# Patient Record
Sex: Female | Born: 1975
Health system: Southern US, Community
[De-identification: ages and names within clinical notes are randomized; demographics above are authoritative.]

## PROBLEM LIST (undated history)

## (undated) DIAGNOSIS — E7800 Pure hypercholesterolemia, unspecified: Secondary | ICD-10-CM

## (undated) DIAGNOSIS — I499 Cardiac arrhythmia, unspecified: Secondary | ICD-10-CM

## (undated) DIAGNOSIS — K922 Gastrointestinal hemorrhage, unspecified: Secondary | ICD-10-CM

## (undated) DIAGNOSIS — M109 Gout, unspecified: Secondary | ICD-10-CM

## (undated) DIAGNOSIS — I639 Cerebral infarction, unspecified: Secondary | ICD-10-CM

## (undated) DIAGNOSIS — K219 Gastro-esophageal reflux disease without esophagitis: Secondary | ICD-10-CM

## (undated) DIAGNOSIS — G629 Polyneuropathy, unspecified: Secondary | ICD-10-CM

## (undated) DIAGNOSIS — N186 End stage renal disease: Secondary | ICD-10-CM

## (undated) DIAGNOSIS — Z9289 Personal history of other medical treatment: Secondary | ICD-10-CM

## (undated) DIAGNOSIS — I10 Essential (primary) hypertension: Secondary | ICD-10-CM

## (undated) DIAGNOSIS — I509 Heart failure, unspecified: Secondary | ICD-10-CM

## (undated) DIAGNOSIS — F419 Anxiety disorder, unspecified: Secondary | ICD-10-CM

## (undated) DIAGNOSIS — R51 Headache: Secondary | ICD-10-CM

## (undated) DIAGNOSIS — R011 Cardiac murmur, unspecified: Secondary | ICD-10-CM

## (undated) DIAGNOSIS — R0600 Dyspnea, unspecified: Secondary | ICD-10-CM

## (undated) DIAGNOSIS — N050 Unspecified nephritic syndrome with minor glomerular abnormality: Secondary | ICD-10-CM

## (undated) DIAGNOSIS — M1990 Unspecified osteoarthritis, unspecified site: Secondary | ICD-10-CM

## (undated) DIAGNOSIS — D509 Iron deficiency anemia, unspecified: Secondary | ICD-10-CM

## (undated) DIAGNOSIS — Z992 Dependence on renal dialysis: Secondary | ICD-10-CM

## (undated) HISTORY — PX: ESOPHAGOGASTRODUODENOSCOPY: SHX1529

## (undated) HISTORY — PX: CARDIAC CATHETERIZATION: SHX172

## (undated) HISTORY — PX: FRACTURE SURGERY: SHX138

---
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
## 2008-01-06 HISTORY — PX: ABDOMINAL HYSTERECTOMY: SHX81

## 2009-01-24 ENCOUNTER — Emergency Department (HOSPITAL_COMMUNITY): Admission: EM | Admit: 2009-01-24 | Discharge: 2009-01-24 | Payer: Self-pay | Admitting: Emergency Medicine

## 2009-04-02 ENCOUNTER — Other Ambulatory Visit: Admission: RE | Admit: 2009-04-02 | Discharge: 2009-04-02 | Payer: Self-pay | Admitting: Family Medicine

## 2009-04-17 ENCOUNTER — Emergency Department (HOSPITAL_COMMUNITY): Admission: EM | Admit: 2009-04-17 | Discharge: 2009-04-18 | Payer: Self-pay | Admitting: Emergency Medicine

## 2009-04-18 ENCOUNTER — Ambulatory Visit: Payer: Self-pay | Admitting: Surgery

## 2009-04-18 ENCOUNTER — Encounter (INDEPENDENT_AMBULATORY_CARE_PROVIDER_SITE_OTHER): Payer: Self-pay | Admitting: Emergency Medicine

## 2009-04-18 ENCOUNTER — Ambulatory Visit (HOSPITAL_COMMUNITY): Admission: RE | Admit: 2009-04-18 | Discharge: 2009-04-18 | Payer: Self-pay | Admitting: Emergency Medicine

## 2009-06-10 ENCOUNTER — Encounter: Admission: RE | Admit: 2009-06-10 | Discharge: 2009-06-10 | Payer: Self-pay | Admitting: Family Medicine

## 2009-09-03 ENCOUNTER — Emergency Department (HOSPITAL_COMMUNITY): Admission: EM | Admit: 2009-09-03 | Discharge: 2009-09-04 | Payer: Self-pay | Admitting: Emergency Medicine

## 2009-09-12 ENCOUNTER — Encounter: Admission: RE | Admit: 2009-09-12 | Discharge: 2009-09-12 | Payer: Self-pay | Admitting: Family Medicine

## 2010-01-26 ENCOUNTER — Encounter: Payer: Self-pay | Admitting: Family Medicine

## 2010-02-25 ENCOUNTER — Observation Stay (HOSPITAL_COMMUNITY)
Admission: EM | Admit: 2010-02-25 | Discharge: 2010-02-28 | DRG: 143 | Disposition: A | Discharge status: Home / Self Care | Payer: BC Managed Care – PPO | Attending: Family Medicine | Admitting: Family Medicine

## 2010-02-25 ENCOUNTER — Emergency Department (HOSPITAL_COMMUNITY): Payer: BC Managed Care – PPO

## 2010-02-25 DIAGNOSIS — R079 Chest pain, unspecified: Principal | ICD-10-CM | POA: Insufficient documentation

## 2010-02-25 DIAGNOSIS — E119 Type 2 diabetes mellitus without complications: Secondary | ICD-10-CM | POA: Insufficient documentation

## 2010-02-25 DIAGNOSIS — I10 Essential (primary) hypertension: Secondary | ICD-10-CM | POA: Insufficient documentation

## 2010-02-25 DIAGNOSIS — R42 Dizziness and giddiness: Secondary | ICD-10-CM | POA: Insufficient documentation

## 2010-02-25 DIAGNOSIS — E785 Hyperlipidemia, unspecified: Secondary | ICD-10-CM | POA: Insufficient documentation

## 2010-02-25 DIAGNOSIS — K219 Gastro-esophageal reflux disease without esophagitis: Secondary | ICD-10-CM | POA: Insufficient documentation

## 2010-02-25 LAB — CBC
MCH: 25.2 pg — ABNORMAL LOW (ref 26.0–34.0)
RDW: 13.9 % (ref 11.5–15.5)
WBC: 5.9 10*3/uL (ref 4.0–10.5)

## 2010-02-25 LAB — URINALYSIS, ROUTINE W REFLEX MICROSCOPIC
Bilirubin Urine: NEGATIVE
Ketones, ur: NEGATIVE mg/dL
Specific Gravity, Urine: 1.02 (ref 1.005–1.030)

## 2010-02-25 LAB — POCT CARDIAC MARKERS
CKMB, poc: <1 ng/mL — ABNORMAL LOW (ref 1.0–8.0)
Troponin i, poc: 0.06 ng/mL (ref 0.00–0.09)
Troponin i, poc: <0.05 ng/mL (ref 0.00–0.09)

## 2010-02-25 LAB — POCT I-STAT, CHEM 8
Calcium, Ion: 1.23 mmol/L (ref 1.12–1.32)
Glucose, Bld: 112 mg/dL — ABNORMAL HIGH (ref 70–99)
HCT: 39 % (ref 36.0–46.0)
Hemoglobin: 13.3 g/dL (ref 12.0–15.0)
TCO2: 27 mmol/L (ref 0–100)

## 2010-02-25 LAB — DIFFERENTIAL
Basophils Relative: 1 % (ref 0–1)
Lymphocytes Relative: 46 % (ref 12–46)
Lymphs Abs: 2.7 10*3/uL (ref 0.7–4.0)

## 2010-02-25 LAB — D-DIMER, QUANTITATIVE: D-Dimer, Quant: <0.22 ug/mL-FEU (ref 0.00–0.48)

## 2010-02-25 LAB — CK TOTAL AND CKMB (NOT AT ARMC): Total CK: 103 U/L (ref 7–177)

## 2010-02-26 DIAGNOSIS — R079 Chest pain, unspecified: Secondary | ICD-10-CM

## 2010-02-26 LAB — CARDIAC PANEL(CRET KIN+CKTOT+MB+TROPI)
CK, MB: 1 ng/mL (ref 0.3–4.0)
Relative Index: INVALID (ref 0.0–2.5)
Total CK: 105 U/L (ref 7–177)

## 2010-02-26 LAB — URINE CULTURE: Colony Count: 8000

## 2010-02-26 LAB — COMPREHENSIVE METABOLIC PANEL
ALT: 21 U/L (ref 0–35)
AST: 22 U/L (ref 0–37)
Alkaline Phosphatase: 36 U/L — ABNORMAL LOW (ref 39–117)
Chloride: 102 mEq/L (ref 96–112)
Glucose, Bld: 101 mg/dL — ABNORMAL HIGH (ref 70–99)
Potassium: 3.7 mEq/L (ref 3.5–5.1)

## 2010-02-26 LAB — HEMOGLOBIN A1C
Hgb A1c MFr Bld: 6.2 % — ABNORMAL HIGH (ref <5.7)
Mean Plasma Glucose: 131 mg/dL — ABNORMAL HIGH (ref <117)

## 2010-02-26 LAB — CBC
HCT: 32.9 % — ABNORMAL LOW (ref 36.0–46.0)
RDW: 14.2 % (ref 11.5–15.5)
WBC: 6.1 10*3/uL (ref 4.0–10.5)

## 2010-02-26 LAB — LIPID PANEL: HDL: 33 mg/dL — ABNORMAL LOW (ref >39)

## 2010-02-26 LAB — GLUCOSE, CAPILLARY

## 2010-02-26 LAB — RAPID URINE DRUG SCREEN, HOSP PERFORMED
Amphetamines: NOT DETECTED
Barbiturates: NOT DETECTED
Benzodiazepines: NOT DETECTED
Tetrahydrocannabinol: NOT DETECTED

## 2010-02-26 NOTE — H&P (Signed)
NAMEJOSEPHINA, Patricia [name]             ACCOUNT NO.:  [id number]  MEDICAL RECORD NO.:  [id number]           PATIENT TYPE:  E  LOCATION:  [facility]                         FACILITY:  [facility]  PHYSICIAN:  Karlyn Agee, M.D. DATE OF BIRTH:  1975-10-21  DATE OF ADMISSION:  02/25/2010 DATE OF DISCHARGE:                             HISTORY & PHYSICAL   PRIMARY CARE PHYSICIAN:  [facility], she cannot remember the doctor's name.  CHIEF COMPLAINT:  Persistent chest pain since 5 a.m. this morning.  HISTORY OF PRESENT ILLNESS:  This is a morbidly obese 35 year old African American lady with history of diabetes, hypertension, hyperlipidemia, and GERD who was awakened from sleep at about 5 a.m. this morning with 10/10 central, pressing chest pain which she initially felt was due to gas and she has tried omeprazole, various antacids without relief.  She says the pain is very difficult to describe, it is sharp, it is shooting, it is pressing.  She eventually decided to go to work, but had difficulty working due to the persistent pain and eventually decided to come to the emergency room.  She denies any associated diaphoresis or shortness of breath.  She did have some dizziness.  She did have some nausea and no vomiting.  She has had no lower extremity edema, no paroxysmal nocturnal dyspnea.  PAST MEDICAL HISTORY:  Diabetes, hypertension, hyperlipidemia, and GERD.  MEDICATIONS: 1. HCTZ 25 mg each evening. 2. Lisinopril 40 mg each evening. 3. Metformin 500 mg each evening. 4. Omeprazole 40 mg each evening. 5. Simvastatin 20 mg each evening.  ALLERGIES:  No known drug allergies.  SOCIAL HISTORY:  She denies tobacco, alcohol, or illicit drug use.  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  FAMILY HISTORY: Father had heart attack at age 109.  PHYSICAL EXAMINATION:  GENERAL:  An obese young African American  lady lying in the stretcher, looking uncomfortable. VITAL SIGNS:  She gives her height is 5 feet 8 inches, her weight is 260 pounds, her temperature is 98.6, pulse 88, respirations 16, blood pressure 98/45, she is saturating 100% on room air. HEENT:  Pupils are round, equal, and reactive.  Mucous membranes pink. Anicteric.  No cervical lymphadenopathy or thyromegaly.  No carotid bruit, although she has very thick neck. CHEST:  Clear to auscultation bilaterally.  She has no reproducible chest wall pain. ABDOMEN:  Obese, soft, and nontender.  She has no epigastric tenderness. EXTREMITIES:  Without edema.  Dorsalis pedis pulses 2+ bilaterally. CENTRAL NERVOUS SYSTEM:  Cranial nerves II-XII are grossly intact.  She has no focal lateralizing signs.  Her labs, her white count 5.8, hemoglobin 11.7, platelets 240, normal differential.  Her sodium is 138, potassium 3.5, chloride 100, glucose 112, BUN 10, creatinine 0.7.  Her D-dimer is undetectable.  Cardiac markers initially showed a troponin of 0.06, myoglobin of 54, undetectable CK-MB, repeat shows undetectable troponin and CK-MB, myoglobin of 34.  Urinalysis is completely unremarkable.  Her EKG shows normal sinus rhythm with no ST-segment changes.  Chest x-ray shows low lung volumes with clear lungs.  ASSESSMENT:  Persistent chest pain, atypical in nature but lady does have multiple cardiac risk factors including hypertension, diabetes, and obesity, to say nothing on family history.  We will therefore bring in on observation for cardiac workup.  We will consult Cardiology for assistance with management.  We will go ahead and give nitro paste, cycle cardiac enzymes, a low-dose beta blocker as tolerated and manage her on step-down.  We will continue morphine for the pain.  We will give her intravenous proton pump inhibitors.  Otherwise other plans as per orders.     Karlyn Agee, M.D.     LC/MEDQ  D:  02/25/2010  T:  02/25/2010   Job:  [id number]  cc:   [facility]  Electronically Signed by Karlyn Agee M.D. on 02/26/2010 03:01:18 AM

## 2010-02-27 ENCOUNTER — Inpatient Hospital Stay (HOSPITAL_COMMUNITY): Payer: BC Managed Care – PPO

## 2010-02-27 LAB — GLUCOSE, CAPILLARY
Glucose-Capillary: 145 mg/dL — ABNORMAL HIGH (ref 70–99)
Glucose-Capillary: 95 mg/dL (ref 70–99)

## 2010-02-28 ENCOUNTER — Inpatient Hospital Stay (HOSPITAL_COMMUNITY): Payer: BC Managed Care – PPO

## 2010-02-28 DIAGNOSIS — R072 Precordial pain: Secondary | ICD-10-CM

## 2010-02-28 LAB — GLUCOSE, CAPILLARY

## 2010-02-28 MED ORDER — TECHNETIUM TC 99M TETROFOSMIN IV KIT
30.0000 | PACK | Freq: Once | INTRAVENOUS | Status: AC | PRN
  Administered 2010-02-27: 30 via INTRAVENOUS

## 2010-02-28 MED ORDER — TECHNETIUM TC 99M TETROFOSMIN IV KIT
30.0000 | PACK | Freq: Once | INTRAVENOUS | Status: AC | PRN
  Administered 2010-02-28: 30 via INTRAVENOUS

## 2010-03-13 NOTE — Op Note (Signed)
Patricia [name], Patricia [name]             ACCOUNT NO.:  [id number]  MEDICAL RECORD NO.:  [id number]           PATIENT TYPE:  I  LOCATION:  2021                         FACILITY:  [facility]  PHYSICIAN:  Murray Hodgkins, MD    DATE OF BIRTH:  [birth year]  DATE OF PROCEDURE: DATE OF DISCHARGE:  02/28/2010                              DISCHARGE SUMMARY   PRIMARY CARE PHYSICIAN:  Kathyrn Lass, MD with [facility].  DISCHARGE DIAGNOSES: 1. Atypical chest pain with negative cardiac workup this admission. 2. Type 2 diabetes. 3. Dyslipidemia. 4. Hypertension. 5. Obesity.  PROCEDURES DURING HOSPITALIZATION: 1. Chest x-ray performed on February 25, 2010, with no acute findings. 2. Myoview performed on February 28, 2010, with no findings of     infarction or ischemia showing ejection fraction of 68%. 3. A 2-D echocardiogram performed on February 28, 2010, with results     pending at time of this dictation.  CONSULTATIONS DURING HOSPITALIZATION:  [facility], Shaune Pascal. Bensimhon, MD.  HISTORY OF PRESENT ILLNESS:  Patricia [name] is a 35 year old African- American female with history of type 2 diabetes, hypertension, hyperlipidemia, and obesity who presented to [facility] Emergency Department on date of admission with complaints of chest pain.  The patient stated chest pain awoke her from sleep on morning of admission. She described pain as pressing in nature and unrelieved with omeprazole and antacids.  Given the patient's multiple risk factors for coronary artery disease including history listed above and family history, the patient was admitted for further evaluation.  COURSE OF HOSPITALIZATION: 1. Atypical chest pain.  As mentioned above, the patient was admitted     due to multiple comorbidities and risk factors for coronary artery     disease, [facility] was asked to see the patient in     consultation.  At this time, the patient has undergone negative     cardiac  workup including negative cardiac enzymes, unremarkable     EKG, and negative Myoview study performed over two days.  The patient     is felt medically stable for disposition to home.  Her 2-D     echocardiogram will be followed up by [facility].  The     patient's pain could be musculoskeletal in nature as the patient     does do a significant amount of heavy lifting as she works as a     Quarry manager. 2. Type 2 diabetes.  Hemoglobin A1c during this admission 6.2%.  The     patient will be discharged home on metformin as prior to admission. 3. Hyperlipidemia.  The patient will continue stat medication at time     of discharge with slightly abnormal LDL and HDL.  The patient has     been counseled extensively on lifestyle changes including diet and     exercise.  The patient has verified understanding.  Would recommend     PCP followup, lipids in 3-6 months to determine need for further     counseling and/or titration of medication. 4. History of hypertension.  We will continue the patient on ACE     inhibitor and diuretic  at discharge.  LABORATORY DATA:  Pertinent labs at the time of discharge; hemoglobin A1c 6.2, total cholesterol 175, triglycerides 122, HDL 33, LDL 118.  MEDICATIONS:  At time of discharge, 1. Hydrochlorothiazide 25 mg p.o. daily. 2. Lisinopril 40 mg p.o. daily. 3. Metformin 500 mg p.o. daily. 4. Omeprazole 40 mg p.o. daily. 5. Simvastatin 20 mg p.o. daily.  PHYSICAL EXAM:  VITAL SIGNS:  At the time of discharge, blood pressure 103/70, heart rate 67, respirations 19, temperature 98.1, O2 sat is 99% on room air. GENERAL:  This is an obese African-American female sitting upright in bed, in no acute distress. HEENT:  Head is normocephalic, atraumatic.  Eyes, extraocular motions intact without scleral icterus or injection.  Ears, nose, and throat, mucous membranes are moist with no oropharyngeal lesions. NECK:  Supple with no thyromegaly or lymphadenopathy.  No JVD  or carotid bruits. CHEST:  Symmetrical movement, nontender to palpation. CARDIOVASCULAR:  S1 and S2, regular rate and rhythm.  No murmur, rub, or gallop.  No lower extremity edema. RESPIRATORY:  Lung sounds are clear to auscultation bilaterally.  No wheezes, rales, or crackles.  No increased work of breathing. GI:  Abdomen is obese, soft, nontender, nondistended with positive bowel sounds.  No appreciated masses or hepatosplenomegaly. NEUROLOGIC:  The patient is able to move all extremities x4 without motor or sensory deficit on exam. SKIN:  Without suspicious rashes or lesions. PSYCHOLOGIC:  The patient is alert and oriented x4 with very pleasant mood and affect.  DISPOSITION:  The patient is felt medically stable for discharge home at this time.  Again Cardiology will follow results of the patient's 2-D echocardiogram.  The patient has been asked to follow up with her primary care physician in 1-2 weeks post discharge.  Greater than 30 minutes spent on discharge planning.     Patricia Ranks, NP   ______________________________ Murray Hodgkins, MD    LE/MEDQ  D:  02/28/2010  T:  02/28/2010  Job:  [id number]  cc:   Kathyrn Lass, M.D.  Electronically Signed by Patricia Ranks NP on 03/03/2010 09:52:02 AM Electronically Signed by Murray Hodgkins  on 03/12/2010 09:03:22 PM

## 2010-03-14 NOTE — Consult Note (Signed)
Patricia [name], DWIGGINS             ACCOUNT NO.:  [id number]  MEDICAL RECORD NO.:  [id number]           PATIENT TYPE:  E  LOCATION:  [facility]                         FACILITY:  [facility]  PHYSICIAN:  Johnney Ou, MD DATE OF BIRTH:  Feb 22, 1975  DATE OF CONSULTATION:  02/25/2010 DATE OF DISCHARGE:                                CONSULTATION   PRIMARY CARDIOLOGIST:  None.  PRIMARY DOCTOR:  [facility].  REASON FOR CONSULTATION:  Chest pain.  CHIEF COMPLAINT:  Chest pain.  HISTORY OF PRESENT ILLNESS:  This is a 35 year old obese African American female with a history of hypertension and diabetes who presents here with chest pain starting today.  Her symptoms are described as sharp, shooting pains radiating to her left shoulder.  These symptoms began this morning and worsened throughout the day.  She also reports her symptoms worsening sometimes associated with breathing.  She otherwise denies any previous exertional angina, dyspnea on exertion, or increased lower extremity edema.  She also reports having a negative stress test a few years ago for similar symptoms.  PAST MEDICAL HISTORY: 1. Diabetes. 2. Hypertension. 3. Obesity. 4. GERD.  ALLERGIES:  No known drug allergies.  MEDICATIONS ON ADMISSION: 1. Hydrochlorothiazide 25 mg daily. 2. Lisinopril 40 mg daily. 3. Metformin 500 mg daily. 4. Omeprazole.  SOCIAL HISTORY:  She lives in [place].  She works at [employer].  She does not smoke.  FAMILY HISTORY:  She describes her father as having heart disease, diagnosed in his 83s.  REVIEW OF SYSTEMS:  All 14 systems were reviewed and were negative except as mentioned in detail in HPI.  PHYSICAL EXAMINATION:  VITAL SIGNS:  Blood pressure is 110/53, respiratory rate is 20, pulses 78 and regular, and saturating 100% on room air. GENERAL:  She is a 35 year old obese African American female appearing stated age in no  acute distress. HEENT:  Moist mucous membranes.  Pupils equal, round, and reactive to light and accommodation.  Anicteric sclerae. NECK:  No jugular venous distention.  No thyromegaly. CARDIOVASCULAR:  Regular rate and rhythm.  No murmurs, rubs, or gallops. LUNGS:  Clear to auscultation bilaterally. ABDOMEN:  Nontender and nondistended.  Positive bowel sounds.  No masses. EXTREMITIES:  No clubbing, cyanosis, or edema. NEUROLOGIC:  Alert and oriented x3.  Cranial nerves II through XII grossly intact.  No focal neurologic deficits. SKIN:  Warm, dry, and intact.  No rashes. PSYCHIATRIC:  Mood and affect are appropriate.  RADIOLOGY:  Chest x-ray showed no acute cardiopulmonary process.  EKG showed normal sinus rhythm with a rate of 82 beats per minute with no S3 or T-wave abnormalities, normal EKG.  LABORATORY REVIEW:  White blood cell count is 5.9 and hematocrit 35.4. Potassium 3.5, creatinine is 0.7.  D-dimer is less than 0.22.  Troponin is less than 0.05.  ASSESSMENT AND PLAN:  This is a 35 year old [place] American female with a history of hypertension and diabetes here with atypical chest pain. 1. Chest pain.  There is currently no objective evidence of ischemia.     Continue to cycle serial cardiac enzymes to rule out  myocardial     infarction.  This chest pain is atypical and is less likely     musculoskeletal in etiology though she may benefit from noninvasive     risk stratification in the morning.  Additionally, lifestyle     modification should be recommended and was discussed during this     encounter. 2. Hypertension.  Continue her current medical regimen.  Her blood     pressure is currently well controlled. 3. Diabetes.  Check A1c and fasting lipid profile.     Johnney Ou, MD     BHH/MEDQ  D:  02/25/2010  T:  02/25/2010  Job:  [id number]  Electronically Signed by Matthew Saras MD on 03/14/2010 12:53:24 PM

## 2010-03-21 LAB — DIFFERENTIAL
Basophils Absolute: 0 10*3/uL (ref 0.0–0.1)
Lymphocytes Relative: 41 % (ref 12–46)
Neutro Abs: 3.9 10*3/uL (ref 1.7–7.7)

## 2010-03-21 LAB — D-DIMER, QUANTITATIVE: D-Dimer, Quant: <0.22 ug/mL-FEU (ref 0.00–0.48)

## 2010-03-21 LAB — HEPATIC FUNCTION PANEL
AST: 29 U/L (ref 0–37)
Albumin: 3.6 g/dL (ref 3.5–5.2)
Total Protein: 6.7 g/dL (ref 6.0–8.3)

## 2010-03-21 LAB — CBC
Platelets: 314 10*3/uL (ref 150–400)
RBC: 4.69 MIL/uL (ref 3.87–5.11)
RDW: 13.6 % (ref 11.5–15.5)
WBC: 7.8 10*3/uL (ref 4.0–10.5)

## 2010-03-21 LAB — BASIC METABOLIC PANEL
BUN: 10 mg/dL (ref 6–23)
Creatinine, Ser: 0.69 mg/dL (ref 0.4–1.2)
GFR calc Af Amer: >60 mL/min (ref >60)
GFR calc non Af Amer: >60 mL/min (ref >60)
Potassium: 3.9 mEq/L (ref 3.5–5.1)

## 2010-03-24 LAB — COMPREHENSIVE METABOLIC PANEL
AST: 19 U/L (ref 0–37)
CO2: 26 mEq/L (ref 19–32)
Chloride: 104 mEq/L (ref 96–112)
Creatinine, Ser: 0.51 mg/dL (ref 0.4–1.2)
GFR calc Af Amer: >60 mL/min (ref >60)
GFR calc non Af Amer: >60 mL/min (ref >60)
Total Bilirubin: 0.7 mg/dL (ref 0.3–1.2)

## 2010-03-24 LAB — CBC
HCT: 33.8 % — ABNORMAL LOW (ref 36.0–46.0)
Hemoglobin: 11.3 g/dL — ABNORMAL LOW (ref 12.0–15.0)
MCV: 78.4 fL (ref 78.0–100.0)
RBC: 4.31 MIL/uL (ref 3.87–5.11)
WBC: 6.8 10*3/uL (ref 4.0–10.5)

## 2010-03-24 LAB — POCT CARDIAC MARKERS: Troponin i, poc: <0.05 ng/mL (ref 0.00–0.09)

## 2010-03-24 LAB — DIFFERENTIAL
Basophils Absolute: 0 10*3/uL (ref 0.0–0.1)
Eosinophils Absolute: 0.1 10*3/uL (ref 0.0–0.7)
Eosinophils Relative: 2 % (ref 0–5)
Lymphocytes Relative: 36 % (ref 12–46)

## 2010-03-26 LAB — POCT I-STAT, CHEM 8
BUN: 9 mg/dL (ref 6–23)
Calcium, Ion: 1.2 mmol/L (ref 1.12–1.32)
Creatinine, Ser: 0.7 mg/dL (ref 0.4–1.2)
Glucose, Bld: 117 mg/dL — ABNORMAL HIGH (ref 70–99)
TCO2: 27 mmol/L (ref 0–100)

## 2010-10-20 ENCOUNTER — Emergency Department (HOSPITAL_COMMUNITY)
Admission: EM | Admit: 2010-10-20 | Discharge: 2010-10-21 | Disposition: A | Discharge status: Home / Self Care | Payer: Self-pay | Attending: Emergency Medicine | Admitting: Emergency Medicine

## 2010-10-20 DIAGNOSIS — K625 Hemorrhage of anus and rectum: Secondary | ICD-10-CM | POA: Insufficient documentation

## 2010-10-20 DIAGNOSIS — E119 Type 2 diabetes mellitus without complications: Secondary | ICD-10-CM | POA: Insufficient documentation

## 2010-10-20 DIAGNOSIS — R10819 Abdominal tenderness, unspecified site: Secondary | ICD-10-CM | POA: Insufficient documentation

## 2010-10-20 DIAGNOSIS — Z79899 Other long term (current) drug therapy: Secondary | ICD-10-CM | POA: Insufficient documentation

## 2010-10-20 DIAGNOSIS — I10 Essential (primary) hypertension: Secondary | ICD-10-CM | POA: Insufficient documentation

## 2010-10-20 DIAGNOSIS — R109 Unspecified abdominal pain: Secondary | ICD-10-CM | POA: Insufficient documentation

## 2010-10-20 DIAGNOSIS — K219 Gastro-esophageal reflux disease without esophagitis: Secondary | ICD-10-CM | POA: Insufficient documentation

## 2010-10-20 LAB — DIFFERENTIAL
Basophils Relative: 0 % (ref 0–1)
Eosinophils Absolute: 0.2 10*3/uL (ref 0.0–0.7)
Eosinophils Relative: 3 % (ref 0–5)
Monocytes Absolute: 0.6 10*3/uL (ref 0.1–1.0)
Monocytes Relative: 8 % (ref 3–12)

## 2010-10-20 LAB — CBC
MCV: 76 fL — ABNORMAL LOW (ref 78.0–100.0)
RDW: 13.6 % (ref 11.5–15.5)

## 2010-10-20 LAB — COMPREHENSIVE METABOLIC PANEL
ALT: 23 U/L (ref 0–35)
AST: 25 U/L (ref 0–37)
Alkaline Phosphatase: 51 U/L (ref 39–117)
CO2: 25 mEq/L (ref 19–32)
Calcium: 8.9 mg/dL (ref 8.4–10.5)
Chloride: 105 mEq/L (ref 96–112)
GFR calc non Af Amer: >90 mL/min (ref >90)
Potassium: 4.1 mEq/L (ref 3.5–5.1)
Sodium: 139 mEq/L (ref 135–145)

## 2010-10-20 LAB — SAMPLE TO BLOOD BANK

## 2010-10-21 LAB — OCCULT BLOOD, POC DEVICE: Fecal Occult Bld: NEGATIVE

## 2010-12-01 ENCOUNTER — Encounter: Payer: Self-pay | Admitting: *Deleted

## 2010-12-01 ENCOUNTER — Emergency Department (HOSPITAL_COMMUNITY): Payer: Managed Care, Other (non HMO)

## 2010-12-01 ENCOUNTER — Emergency Department (HOSPITAL_COMMUNITY)
Admission: EM | Admit: 2010-12-01 | Discharge: 2010-12-01 | Disposition: A | Discharge status: Home / Self Care | Payer: Managed Care, Other (non HMO) | Attending: Emergency Medicine | Admitting: Emergency Medicine

## 2010-12-01 ENCOUNTER — Other Ambulatory Visit: Payer: Self-pay

## 2010-12-01 DIAGNOSIS — R079 Chest pain, unspecified: Secondary | ICD-10-CM | POA: Insufficient documentation

## 2010-12-01 DIAGNOSIS — M79609 Pain in unspecified limb: Secondary | ICD-10-CM | POA: Insufficient documentation

## 2010-12-01 DIAGNOSIS — I10 Essential (primary) hypertension: Secondary | ICD-10-CM | POA: Insufficient documentation

## 2010-12-01 DIAGNOSIS — K219 Gastro-esophageal reflux disease without esophagitis: Secondary | ICD-10-CM | POA: Insufficient documentation

## 2010-12-01 DIAGNOSIS — R0602 Shortness of breath: Secondary | ICD-10-CM | POA: Insufficient documentation

## 2010-12-01 DIAGNOSIS — Z79899 Other long term (current) drug therapy: Secondary | ICD-10-CM | POA: Insufficient documentation

## 2010-12-01 DIAGNOSIS — R002 Palpitations: Secondary | ICD-10-CM | POA: Insufficient documentation

## 2010-12-01 DIAGNOSIS — E119 Type 2 diabetes mellitus without complications: Secondary | ICD-10-CM | POA: Insufficient documentation

## 2010-12-01 DIAGNOSIS — R110 Nausea: Secondary | ICD-10-CM | POA: Insufficient documentation

## 2010-12-01 HISTORY — DX: Gastro-esophageal reflux disease without esophagitis: K21.9

## 2010-12-01 HISTORY — DX: Essential (primary) hypertension: I10

## 2010-12-01 LAB — POCT I-STAT TROPONIN I: Troponin i, poc: 0 ng/mL (ref 0.00–0.08)

## 2010-12-01 LAB — CBC
Hemoglobin: 10.9 g/dL — ABNORMAL LOW (ref 12.0–15.0)
MCH: 24.1 pg — ABNORMAL LOW (ref 26.0–34.0)
MCHC: 31.7 g/dL (ref 30.0–36.0)
MCV: 76.1 fL — ABNORMAL LOW (ref 78.0–100.0)
RBC: 4.52 MIL/uL (ref 3.87–5.11)

## 2010-12-01 LAB — BASIC METABOLIC PANEL
CO2: 26 mEq/L (ref 19–32)
Calcium: 8.5 mg/dL (ref 8.4–10.5)
Creatinine, Ser: 0.51 mg/dL (ref 0.50–1.10)
GFR calc non Af Amer: >90 mL/min (ref >90)
Glucose, Bld: 105 mg/dL — ABNORMAL HIGH (ref 70–99)
Sodium: 137 mEq/L (ref 135–145)

## 2010-12-01 MED ORDER — ASPIRIN 325 MG PO TABS: 325.0000 mg | ORAL_TABLET | ORAL | Status: DC

## 2010-12-01 MED ORDER — MORPHINE SULFATE 4 MG/ML IJ SOLN
4.0000 mg | Freq: Once | INTRAMUSCULAR | Status: DC
  Filled 2010-12-01: qty 1

## 2010-12-01 MED ORDER — NITROGLYCERIN 0.4 MG SL SUBL
0.4000 mg | SUBLINGUAL_TABLET | SUBLINGUAL | Status: DC | PRN
  Filled 2010-12-01: qty 25

## 2010-12-01 MED ORDER — ASPIRIN 81 MG PO CHEW
CHEWABLE_TABLET | ORAL | Status: AC
  Administered 2010-12-01: 81 mg
  Filled 2010-12-01: qty 4

## 2010-12-01 MED ORDER — ONDANSETRON HCL 4 MG/2ML IJ SOLN
4.0000 mg | Freq: Once | INTRAMUSCULAR | Status: AC
  Administered 2010-12-01: 4 mg via INTRAVENOUS
  Filled 2010-12-01: qty 2

## 2010-12-01 MED ORDER — MORPHINE SULFATE 4 MG/ML IJ SOLN
4.0000 mg | Freq: Once | INTRAMUSCULAR | Status: AC
  Administered 2010-12-01: 4 mg via INTRAVENOUS
  Filled 2010-12-01: qty 1

## 2010-12-01 NOTE — ED Provider Notes (Signed)
Medical screening examination/treatment/procedure(s) were performed by non-physician practitioner and as supervising physician I was immediately available for consultation/collaboration.    Dot Lanes, MD 12/01/10 1321

## 2010-12-01 NOTE — ED Notes (Signed)
Pt reports having chest pressure, nausea and SOB x 2 days.  States that she has been under a lot of stress and she has been lethargic lately.  Reports that she rested all day yesterday and tried to make the pain go away.  Pt ambulatory in dept.  States that at 0430 this am, the pain hit her in the (L) side of her chest as a pressure, radiated down her (L) arm and up into her (L) jaw.  States that nothing makes the pain worse and nothing makes the pain better.  Pt denies taking medications PTA.  Pt denies pain with palpation, deep breath.  Skin warm, dry and intact.  Neuro intact.

## 2010-12-01 NOTE — ED Notes (Signed)
Pt reports mid chest pressure and heaviness that became worse this am, ekg done at triage.

## 2010-12-01 NOTE — ED Provider Notes (Signed)
History    patient is presenting to the ED today with chief complaints of chest pain. Patient states, she has been experiencing left chest pressure, with intermittent sharp stabbing sensation, radiating to her left arm. The pain is been ongoing since yesterday. She has noticed some nausea without vomiting. She has been experiencing dyspnea on exertion. She denies recent strenuous exercise. She denies fever, cough, back pain, abdominal pain, dysuria, rash. She has a history of GERD, but denies that this is related. She has tried taking her gastric reflux medication, omeprazole, without relief. She has also tries TUMS, without relief. She denies taking birth control pill, having recent long travel, recent surgery. She does admits to having been very stressful for the past several days, related to family situation. She has prior has similar chest pain 2 years ago and had a negative cardiac workup. She does admits to having been history of heart related disease.  CSN: [id number] Arrival date & time: 12/01/2010  6:10 AM   First MD Initiated Contact with Patient 12/01/10 [phone]      Chief Complaint  Patient presents with  . Chest Pain    (Consider location/radiation/quality/duration/timing/severity/associated sxs/prior treatment) Patient is a 35 y.o. female presenting with chest pain. The history is provided by the patient. No language interpreter was used.  Chest Pain The chest pain began yesterday. Chest pain occurs constantly. The chest pain is unchanged. The pain is associated with breathing and stress. At its most intense, the pain is at 6/10. The pain is currently at 4/10. The severity of the pain is moderate. The quality of the pain is described as pressure-like and stabbing. The pain radiates to the left arm and left shoulder. Chest pain is worsened by stress. Primary symptoms include shortness of breath, palpitations and nausea. Pertinent negatives for primary symptoms include no fever, no syncope,  no cough, no abdominal pain and no vomiting.  The shortness of breath began yesterday. The shortness of breath developed with exertion. The shortness of breath is moderate. The patient's medical history does not include CHF, COPD, asthma or chronic lung disease.  The palpitations also occurred with shortness of breath.  She tried proton pump inhibitors for the symptoms. Risk factors include obesity.     Past Medical History  Diagnosis Date  . Hypertension   . Diabetes mellitus   . Acid reflux     Past Surgical History  Procedure Date  . Abdominal hysterectomy     History reviewed. No pertinent family history.  History  Substance Use Topics  . Smoking status: Never Smoker   . Smokeless tobacco: Not on file  . Alcohol Use: No    OB History    Grav Para Term Preterm Abortions TAB SAB Ect Mult Living                  Review of Systems  Constitutional: Negative for fever.  Respiratory: Positive for shortness of breath. Negative for cough.   Cardiovascular: Positive for chest pain and palpitations. Negative for syncope.  Gastrointestinal: Positive for nausea. Negative for vomiting and abdominal pain.  All other systems reviewed and are negative.    Allergies  Review of patient's allergies indicates no known allergies.  Home Medications   Current Outpatient Rx  Name Route Sig Dispense Refill  . HYDROCHLOROTHIAZIDE 12.5 MG PO CAPS Oral Take 25 mg by mouth daily.      Marland Kitchen LISINOPRIL 40 MG PO TABS Oral Take 40 mg by mouth daily.      Marland Kitchen  METFORMIN HCL 500 MG PO TABS Oral Take 500 mg by mouth daily with breakfast.      . OMEPRAZOLE 40 MG PO CPDR Oral Take 40 mg by mouth daily.        BP 112/67  Pulse 78  Temp(Src) 97.7 F (36.5 C) (Oral)  Resp 18  SpO2 99%  Physical Exam  Constitutional: She is oriented to person, place, and time. She appears well-developed and well-nourished. No distress.  HENT:  Head: Normocephalic and atraumatic.  Eyes: Conjunctivae are normal.    Neck: Normal range of motion. Neck supple.  Cardiovascular: Normal rate and regular rhythm.  Exam reveals no gallop and no friction rub.   No murmur heard. Pulmonary/Chest: Effort normal. No respiratory distress. She has no wheezes.  Abdominal: Soft. Bowel sounds are normal. There is no tenderness.  Musculoskeletal: Normal range of motion.  Neurological: She is alert and oriented to person, place, and time.    ED Course  Procedures (including critical care time)  Labs Reviewed - No data to display No results found.   No diagnosis found.   Date: 12/01/2010  Rate: 72  Rhythm: normal sinus rhythm  QRS Axis: normal  Intervals: normal  ST/T Wave abnormalities: normal  Conduction Disutrbances:none  Narrative Interpretation:   Old EKG Reviewed: unchanged    MDM  Patient presents with chest pain and shortness of breath more concerning for a PE, versus cardiac etiology. She does not appears to be in any acute distress. She has a normal EKG, and oxygen is 100% on room air. She appears to have no significant risk factor for a PE. She does have family history of cardiac disease. According to the patient, she has a negative cardiac workup this year.  9:28 AM Atypical chest pain, with negative troponin, negative d-dimer, a chest x-ray that shown no acute finding. [facility] was consulted, who has agreed to follow up with patient tomorrow the patient. The patient will be contacted for followup appointment. I also recommend for the patient to follow with her primary care doctor, Dr. Orland Penman.         Domenic Moras, [place] 12/01/10 [phone]

## 2011-02-26 ENCOUNTER — Emergency Department (INDEPENDENT_AMBULATORY_CARE_PROVIDER_SITE_OTHER)
Admission: EM | Admit: 2011-02-26 | Discharge: 2011-02-26 | Disposition: A | Discharge status: Home / Self Care | Payer: Managed Care, Other (non HMO) | Source: Home / Self Care | Attending: Family Medicine | Admitting: Family Medicine

## 2011-02-26 ENCOUNTER — Encounter (HOSPITAL_COMMUNITY): Payer: Self-pay | Admitting: Emergency Medicine

## 2011-02-26 DIAGNOSIS — R350 Frequency of micturition: Secondary | ICD-10-CM

## 2011-02-26 DIAGNOSIS — R300 Dysuria: Secondary | ICD-10-CM

## 2011-02-26 HISTORY — DX: Pure hypercholesterolemia, unspecified: E78.00

## 2011-02-26 LAB — POCT URINALYSIS DIP (DEVICE)
Bilirubin Urine: NEGATIVE
Ketones, ur: NEGATIVE mg/dL
Protein, ur: 100 mg/dL — AB
Specific Gravity, Urine: >=1.03 (ref 1.005–1.030)
pH: 6 (ref 5.0–8.0)

## 2011-02-26 MED ORDER — FLUCONAZOLE 200 MG PO TABS: 150.0000 mg | ORAL_TABLET | Freq: Every day | ORAL | Status: AC

## 2011-02-26 MED ORDER — CIPROFLOXACIN HCL 750 MG PO TABS: 750.0000 mg | ORAL_TABLET | Freq: Two times a day (BID) | ORAL | Status: AC

## 2011-02-26 MED ORDER — PHENAZOPYRIDINE HCL 200 MG PO TABS: 200.0000 mg | ORAL_TABLET | Freq: Three times a day (TID) | ORAL | Status: AC

## 2011-02-26 NOTE — Discharge Instructions (Signed)
Avoid caffeine products. Cranberry juice recommended. Follow up if symptoms persist or worsen.

## 2011-02-26 NOTE — ED Notes (Signed)
C/o burning pain in lower abdomen.  Patient reports fever of 101.9.  Patient denies diarrhea.  Reports urinary frequency.  Denies vaginal discharge.  Last bm was this am, normal per patient

## 2011-02-26 NOTE — ED Provider Notes (Signed)
History     CSN: [id number]  Arrival date & time 02/26/11  1218   First MD Initiated Contact with Patient 02/26/11 1354      Chief Complaint  Patient presents with  . Urinary Tract Infection    (Consider location/radiation/quality/duration/timing/severity/associated sxs/prior treatment) HPI Comments: Patient reports having dysuria and frequency x 3 days. Reports fever to 101 yesterday. Taking ibuprofen. Admits to some low back pain. No flank pain. No n/v. Denies vaginal discharge. No tx other than ibp   The history is provided by the patient.    Past Medical History  Diagnosis Date  . Hypertension   . Diabetes mellitus   . Acid reflux   . High cholesterol     Past Surgical History  Procedure Date  . Abdominal hysterectomy     History reviewed. No pertinent family history.  History  Substance Use Topics  . Smoking status: Never Smoker   . Smokeless tobacco: Not on file  . Alcohol Use: No    OB History    Grav Para Term Preterm Abortions TAB SAB Ect Mult Living                  Review of Systems  Constitutional: Positive for fever.  HENT: Negative.   Respiratory: Negative.   Cardiovascular: Negative.   Gastrointestinal: Negative.   Genitourinary: Positive for dysuria, urgency and frequency. Negative for flank pain, vaginal bleeding, vaginal discharge, vaginal pain and pelvic pain.  Musculoskeletal: Positive for back pain.  Skin: Negative.   Neurological: Negative.     Allergies  Review of patient's allergies indicates no known allergies.  Home Medications   Current Outpatient Rx  Name Route Sig Dispense Refill  . SIMVASTATIN 10 MG PO TABS Oral Take 10 mg by mouth at bedtime.    Marland Kitchen CIPROFLOXACIN HCL 750 MG PO TABS Oral Take 1 tablet (750 mg total) by mouth 2 (two) times daily. 14 tablet 0  . HYDROCHLOROTHIAZIDE 12.5 MG PO CAPS Oral Take 25 mg by mouth daily.      Marland Kitchen LISINOPRIL 40 MG PO TABS Oral Take 40 mg by mouth daily.      Marland Kitchen METFORMIN HCL 500 MG  PO TABS Oral Take 500 mg by mouth daily with breakfast.      . OMEPRAZOLE 40 MG PO CPDR Oral Take 40 mg by mouth daily.      Marland Kitchen PHENAZOPYRIDINE HCL 200 MG PO TABS Oral Take 1 tablet (200 mg total) by mouth 3 (three) times daily. 6 tablet 0    BP 118/79  Pulse 75  Temp(Src) 98.9 F (37.2 C) (Oral)  Resp 18  SpO2 98%  Physical Exam  Nursing note and vitals reviewed. Constitutional: She appears well-developed. She appears distressed.  Cardiovascular: Normal rate and regular rhythm.   Pulmonary/Chest: Effort normal and breath sounds normal.  Abdominal: Soft. Bowel sounds are normal. She exhibits no distension. There is no tenderness.  Musculoskeletal: Normal range of motion.  Neurological: She is alert.  Skin: Skin is warm and dry.    ED Course  Procedures (including critical care time)  Labs Reviewed  POCT URINALYSIS DIP (DEVICE) - Abnormal; Notable for the following:    Hgb urine dipstick TRACE (*)    Protein, ur 100 (*)    All other components within normal limits   No results found.   1. Dysuria   2. Urinary frequency       MDM          Luna Glasgow,  MD 02/26/11 1500

## 2011-02-28 ENCOUNTER — Emergency Department (HOSPITAL_COMMUNITY)
Admission: EM | Admit: 2011-02-28 | Discharge: 2011-03-01 | Disposition: A | Discharge status: Home / Self Care | Payer: Managed Care, Other (non HMO) | Attending: Emergency Medicine | Admitting: Emergency Medicine

## 2011-02-28 ENCOUNTER — Telehealth (HOSPITAL_COMMUNITY): Payer: Self-pay | Admitting: Emergency Medicine

## 2011-02-28 ENCOUNTER — Encounter (HOSPITAL_COMMUNITY): Payer: Self-pay | Admitting: Emergency Medicine

## 2011-02-28 ENCOUNTER — Emergency Department (HOSPITAL_COMMUNITY): Payer: Managed Care, Other (non HMO)

## 2011-02-28 DIAGNOSIS — K7689 Other specified diseases of liver: Secondary | ICD-10-CM | POA: Insufficient documentation

## 2011-02-28 DIAGNOSIS — R300 Dysuria: Secondary | ICD-10-CM | POA: Insufficient documentation

## 2011-02-28 DIAGNOSIS — I10 Essential (primary) hypertension: Secondary | ICD-10-CM | POA: Insufficient documentation

## 2011-02-28 DIAGNOSIS — E119 Type 2 diabetes mellitus without complications: Secondary | ICD-10-CM | POA: Insufficient documentation

## 2011-02-28 DIAGNOSIS — E7800 Pure hypercholesterolemia, unspecified: Secondary | ICD-10-CM | POA: Insufficient documentation

## 2011-02-28 DIAGNOSIS — R109 Unspecified abdominal pain: Secondary | ICD-10-CM | POA: Insufficient documentation

## 2011-02-28 LAB — POCT I-STAT, CHEM 8
Creatinine, Ser: 0.6 mg/dL (ref 0.50–1.10)
Glucose, Bld: 99 mg/dL (ref 70–99)
Hemoglobin: 13.6 g/dL (ref 12.0–15.0)
Sodium: 139 mEq/L (ref 135–145)
TCO2: 29 mmol/L (ref 0–100)

## 2011-02-28 LAB — CBC
Hemoglobin: 12.5 g/dL (ref 12.0–15.0)
MCHC: 33.2 g/dL (ref 30.0–36.0)
RBC: 4.95 MIL/uL (ref 3.87–5.11)
WBC: 6.2 10*3/uL (ref 4.0–10.5)

## 2011-02-28 LAB — DIFFERENTIAL
Basophils Relative: 1 % (ref 0–1)
Lymphs Abs: 3.3 10*3/uL (ref 0.7–4.0)
Monocytes Relative: 10 % (ref 3–12)
Neutro Abs: 2.1 10*3/uL (ref 1.7–7.7)
Neutrophils Relative %: 34 % — ABNORMAL LOW (ref 43–77)

## 2011-02-28 LAB — URINALYSIS, ROUTINE W REFLEX MICROSCOPIC
Bilirubin Urine: NEGATIVE
Glucose, UA: NEGATIVE mg/dL
Hgb urine dipstick: NEGATIVE
Ketones, ur: NEGATIVE mg/dL
pH: 5 (ref 5.0–8.0)

## 2011-02-28 LAB — URINE MICROSCOPIC-ADD ON

## 2011-02-28 LAB — COMPREHENSIVE METABOLIC PANEL
Albumin: 3.5 g/dL (ref 3.5–5.2)
Alkaline Phosphatase: 45 U/L (ref 39–117)
BUN: 10 mg/dL (ref 6–23)
Chloride: 99 mEq/L (ref 96–112)
Potassium: 3.7 mEq/L (ref 3.5–5.1)
Total Bilirubin: 0.1 mg/dL — ABNORMAL LOW (ref 0.3–1.2)

## 2011-02-28 MED ORDER — ONDANSETRON HCL 4 MG/2ML IJ SOLN
4.0000 mg | Freq: Once | INTRAMUSCULAR | Status: AC
  Administered 2011-02-28: 4 mg via INTRAVENOUS
  Filled 2011-02-28: qty 2

## 2011-02-28 MED ORDER — FENTANYL CITRATE 0.05 MG/ML IJ SOLN
50.0000 ug | Freq: Once | INTRAMUSCULAR | Status: AC
  Administered 2011-02-28: 50 ug via INTRAVENOUS
  Filled 2011-02-28: qty 2

## 2011-02-28 MED ORDER — IOHEXOL 300 MG/ML  SOLN: 100.0000 mL | Freq: Once | INTRAMUSCULAR | Status: AC | PRN

## 2011-02-28 NOTE — ED Notes (Signed)
Pt c/o burning with urination, tx at urgent care 2/21, taking Cipro, pt now c/o severe low mid abd pain with nausea, chills.

## 2011-02-28 NOTE — ED Notes (Signed)
Patient called stating her symptoms were worse.  Chart reviewed by Dr Jake Michaelis,  Dr Jake Michaelis advised patient to be re-evaluated, have urine retested and sent for culture.  Patient made aware of Dr Viann Shove recommendation.  Patient questioning current wait time.  Patient given approximate wait time and made aware it could change greatly before she arrived to department.  Patient asking if she could go to ER.  I advised that if she felt that she was getting increasingly worse than yes she could.  Patient expressed understanding and had all questions answered

## 2011-02-28 NOTE — ED Provider Notes (Addendum)
History     CSN: [id number]  Arrival date & time 02/28/11  1900   First MD Initiated Contact with Patient 02/28/11 2042      Chief Complaint  Patient presents with  . Abdominal Pain    (Consider location/radiation/quality/duration/timing/severity/associated sxs/prior treatment) The history is provided by the patient.  dysuria for the last couple days. No fever. Patient saw PCP and had urine checked. No vomiting or diarrhea, but has had nausea. Pain is constant. No relief with her medicines. Patient states that she was toled to come to the ED. Left lower abdominal pain. Constant. She has been on Cipro.   Past Medical History  Diagnosis Date  . Hypertension   . Diabetes mellitus   . Acid reflux   . High cholesterol     Past Surgical History  Procedure Date  . Abdominal hysterectomy     No family history on file.  History  Substance Use Topics  . Smoking status: Never Smoker   . Smokeless tobacco: Not on file  . Alcohol Use: No    OB History    Grav Para Term Preterm Abortions TAB SAB Ect Mult Living                  Review of Systems  Constitutional: Negative for activity change and appetite change.  HENT: Negative for neck stiffness.   Eyes: Negative for pain.  Respiratory: Negative for chest tightness and shortness of breath.   Cardiovascular: Negative for chest pain and leg swelling.  Gastrointestinal: Positive for nausea and abdominal pain. Negative for vomiting and diarrhea.  Genitourinary: Positive for dysuria. Negative for flank pain.  Musculoskeletal: Negative for back pain.  Skin: Negative for rash.  Neurological: Negative for weakness, numbness and headaches.  Psychiatric/Behavioral: Negative for behavioral problems.    Allergies  Review of patient's allergies indicates no known allergies.  Home Medications   Current Outpatient Rx  Name Route Sig Dispense Refill  . HYDROCHLOROTHIAZIDE 25 MG PO TABS Oral Take 25 mg by mouth daily.    Marland Kitchen  LISINOPRIL 40 MG PO TABS Oral Take 40 mg by mouth daily.      Marland Kitchen METFORMIN HCL 500 MG PO TABS Oral Take 500 mg by mouth daily with breakfast.      . OMEPRAZOLE 40 MG PO CPDR Oral Take 40 mg by mouth daily.      Marland Kitchen POTASSIUM GLUCONATE PO Oral Take 1 tablet by mouth daily as needed. For cramping.    Marland Kitchen SIMVASTATIN 10 MG PO TABS Oral Take 10 mg by mouth at bedtime.    . OXYCODONE-ACETAMINOPHEN 5-325 MG PO TABS Oral Take 1-2 tablets by mouth every 6 (six) hours as needed for pain. 20 tablet 0    BP 120/62  Pulse 81  Temp(Src) 98 F (36.7 C) (Oral)  Resp 20  Ht $R'5\' 8"'kR$  (1.727 m)  Wt 260 lb (117.935 kg)  BMI 39.53 kg/m2  SpO2 100%  Physical Exam  Nursing note and vitals reviewed. Constitutional: She is oriented to person, place, and time. She appears well-developed and well-nourished.  HENT:  Head: Normocephalic and atraumatic.  Eyes: EOM are normal. Pupils are equal, round, and reactive to light.  Neck: Normal range of motion. Neck supple.  Cardiovascular: Normal rate, regular rhythm and normal heart sounds.   No murmur heard. Pulmonary/Chest: Effort normal and breath sounds normal. No respiratory distress. She has no wheezes. She has no rales.  Abdominal: Soft. Bowel sounds are normal. She exhibits no distension. There  is tenderness. There is no rebound and no guarding.       LLQ tenderness without rebound or guarding.   Musculoskeletal: Normal range of motion.  Neurological: She is alert and oriented to person, place, and time. No cranial nerve deficit.  Skin: Skin is warm and dry.  Psychiatric: She has a normal mood and affect. Her speech is normal.    ED Course  Procedures (including critical care time)  Labs Reviewed  URINALYSIS, ROUTINE W REFLEX MICROSCOPIC - Abnormal; Notable for the following:    Color, Urine AMBER (*) BIOCHEMICALS MAY BE AFFECTED BY COLOR   Specific Gravity, Urine 1.034 (*)    Protein, ur 100 (*)    All other components within normal limits  CBC -  Abnormal; Notable for the following:    MCV 76.0 (*)    MCH 25.3 (*)    All other components within normal limits  DIFFERENTIAL - Abnormal; Notable for the following:    Neutrophils Relative 34 (*)    Lymphocytes Relative 53 (*)    All other components within normal limits  COMPREHENSIVE METABOLIC PANEL - Abnormal; Notable for the following:    Total Bilirubin 0.1 (*)    All other components within normal limits  URINE CULTURE  POCT PREGNANCY, URINE  URINE MICROSCOPIC-ADD ON  POCT I-STAT, CHEM 8  LAB REPORT - SCANNED   No results found.   1. Dysuria       MDM  Lower abdominal pain. Recently seen at urgent care and was started on antibiotics. Urinalysis did not show a clear infection. She's had a previous hysterectomy. White count is elevated but she continues to have tenderness. Urinalysis here does not show infection. CT scan will be done for further evaluation.        Jasper Riling. Alvino Chapel, MD 03/01/11 0000  Ct scan showed only thickened bladder. Possible UTI. Will d/c with pain meds.  Jasper Riling. Alvino Chapel, MD 03/01/11 [place] Alvino Chapel, MD 03/11/11 2302

## 2011-03-01 LAB — URINE CULTURE: Culture  Setup Time: 201302240237

## 2011-03-01 MED ORDER — OXYCODONE-ACETAMINOPHEN 5-325 MG PO TABS: 1.0000 | ORAL_TABLET | Freq: Four times a day (QID) | ORAL | Status: AC | PRN

## 2011-03-01 MED ORDER — IOHEXOL 300 MG/ML  SOLN
100.0000 mL | Freq: Once | INTRAMUSCULAR | Status: AC | PRN
  Administered 2011-03-01: 100 mL via INTRAVENOUS

## 2011-03-01 MED ORDER — ONDANSETRON 8 MG PO TBDP: 8.0000 mg | ORAL_TABLET | Freq: Three times a day (TID) | ORAL | Status: AC | PRN

## 2011-03-01 NOTE — ED Notes (Signed)
Patient transported to CT and returned 

## 2011-03-01 NOTE — Discharge Instructions (Signed)
Dysuria Dysuria is the medical term for pain with urination. There are many causes for dysuria, but urinary tract infection is the most common. If a urinalysis was performed it can show that there is a urinary tract infection. A urine culture confirms that you or your child is sick. You will need to follow up with a healthcare provider because:  If a urine culture was done you will need to know the culture results and treatment recommendations.   If the urine culture was positive, you or your child will need to be put on antibiotics or know if the antibiotics prescribed are the right antibiotics for your urinary tract infection.   If the urine culture is negative (no urinary tract infection), then other causes may need to be explored or antibiotics need to be stopped.  Today laboratory work may have been done and there does not seem to be an infection. If cultures were done they will take at least 24 to 48 hours to be completed. Today x-rays may have been taken and they read as normal. No cause can be found for the problems. The x-rays may be re-read by a radiologist and you will be contacted if additional findings are made. You or your child may have been put on medications to help with this problem until you can see your primary caregiver. If the problems get better, see your primary caregiver if the problems return. If you were given antibiotics (medications which kill germs), take all of the mediations as directed for the full course of treatment.  If laboratory work was done, you need to find the results. Leave a telephone number where you can be reached. If this is not possible, make sure you find out how you are to get test results. HOME CARE INSTRUCTIONS   Drink lots of fluids. For adults, drink eight, 8 ounce glasses of clear juice or water a day. For children, replace fluids as suggested by your caregiver.   Empty the bladder often. Avoid holding urine for long periods of time.   After a  bowel movement, women should cleanse front to back, using each tissue only once.   Empty your bladder before and after sexual intercourse.   Take all the medicine given to you until it is gone. You may feel better in a few days, but TAKE ALL MEDICINE.   Avoid caffeine, tea, alcohol and carbonated beverages, because they tend to irritate the bladder.   In men, alcohol may irritate the prostate.   Only take over-the-counter or prescription medicines for pain, discomfort, or fever as directed by your caregiver.   If your caregiver has given you a follow-up appointment, it is very important to keep that appointment. Not keeping the appointment could result in a chronic or permanent injury, pain, and disability. If there is any problem keeping the appointment, you must call back to this facility for assistance.  SEEK IMMEDIATE MEDICAL CARE IF:   Back pain develops.   A fever develops.   There is nausea (feeling sick to your stomach) or vomiting (throwing up).   Problems are no better with medications or are getting worse.  MAKE SURE YOU:   Understand these instructions.   Will watch your condition.   Will get help right away if you are not doing well or get worse.  Document Released: 09/20/2003 Document Revised: 09/03/2010 Document Reviewed: 07/28/2007 [facility] Patient Information 2012 [place].

## 2011-07-04 ENCOUNTER — Encounter (HOSPITAL_COMMUNITY): Payer: Self-pay | Admitting: Emergency Medicine

## 2011-07-04 ENCOUNTER — Observation Stay (HOSPITAL_COMMUNITY): Payer: Managed Care, Other (non HMO)

## 2011-07-04 ENCOUNTER — Emergency Department (HOSPITAL_COMMUNITY): Payer: Managed Care, Other (non HMO)

## 2011-07-04 ENCOUNTER — Observation Stay (HOSPITAL_COMMUNITY)
Admission: EM | Admit: 2011-07-04 | Discharge: 2011-07-05 | Disposition: A | Discharge status: Home / Self Care | Payer: Managed Care, Other (non HMO) | Attending: Family Medicine | Admitting: Family Medicine

## 2011-07-04 DIAGNOSIS — Z79899 Other long term (current) drug therapy: Secondary | ICD-10-CM | POA: Insufficient documentation

## 2011-07-04 DIAGNOSIS — I10 Essential (primary) hypertension: Secondary | ICD-10-CM

## 2011-07-04 DIAGNOSIS — R6884 Jaw pain: Secondary | ICD-10-CM | POA: Insufficient documentation

## 2011-07-04 DIAGNOSIS — E7800 Pure hypercholesterolemia, unspecified: Secondary | ICD-10-CM

## 2011-07-04 DIAGNOSIS — E1165 Type 2 diabetes mellitus with hyperglycemia: Secondary | ICD-10-CM

## 2011-07-04 DIAGNOSIS — M25519 Pain in unspecified shoulder: Secondary | ICD-10-CM | POA: Insufficient documentation

## 2011-07-04 DIAGNOSIS — R079 Chest pain, unspecified: Secondary | ICD-10-CM

## 2011-07-04 DIAGNOSIS — K449 Diaphragmatic hernia without obstruction or gangrene: Secondary | ICD-10-CM | POA: Insufficient documentation

## 2011-07-04 DIAGNOSIS — R0789 Other chest pain: Principal | ICD-10-CM | POA: Insufficient documentation

## 2011-07-04 DIAGNOSIS — IMO0001 Reserved for inherently not codable concepts without codable children: Secondary | ICD-10-CM

## 2011-07-04 DIAGNOSIS — R61 Generalized hyperhidrosis: Secondary | ICD-10-CM | POA: Insufficient documentation

## 2011-07-04 DIAGNOSIS — E119 Type 2 diabetes mellitus without complications: Secondary | ICD-10-CM

## 2011-07-04 DIAGNOSIS — R0600 Dyspnea, unspecified: Secondary | ICD-10-CM | POA: Diagnosis present

## 2011-07-04 DIAGNOSIS — R0602 Shortness of breath: Secondary | ICD-10-CM

## 2011-07-04 DIAGNOSIS — K219 Gastro-esophageal reflux disease without esophagitis: Secondary | ICD-10-CM | POA: Insufficient documentation

## 2011-07-04 LAB — GLUCOSE, CAPILLARY
Glucose-Capillary: 107 mg/dL — ABNORMAL HIGH (ref 70–99)
Glucose-Capillary: 87 mg/dL (ref 70–99)

## 2011-07-04 LAB — CARDIAC PANEL(CRET KIN+CKTOT+MB+TROPI)
CK, MB: 2.7 ng/mL (ref 0.3–4.0)
Relative Index: 2.2 (ref 0.0–2.5)
Total CK: 110 U/L (ref 7–177)
Troponin I: <0.3 ng/mL (ref <0.30)
Troponin I: <0.3 ng/mL (ref <0.30)
Troponin I: <0.3 ng/mL (ref <0.30)

## 2011-07-04 LAB — BASIC METABOLIC PANEL
BUN: 7 mg/dL (ref 6–23)
CO2: 26 mEq/L (ref 19–32)
Calcium: 9.6 mg/dL (ref 8.4–10.5)
Chloride: 96 mEq/L (ref 96–112)
Creatinine, Ser: 0.57 mg/dL (ref 0.50–1.10)
GFR calc non Af Amer: >90 mL/min (ref >90)
Glucose, Bld: 96 mg/dL (ref 70–99)
Sodium: 134 mEq/L — ABNORMAL LOW (ref 135–145)

## 2011-07-04 LAB — CBC
HCT: 34.9 % — ABNORMAL LOW (ref 36.0–46.0)
MCH: 24.7 pg — ABNORMAL LOW (ref 26.0–34.0)
MCHC: 32.5 g/dL (ref 30.0–36.0)
MCV: 76.4 fL — ABNORMAL LOW (ref 78.0–100.0)
Platelets: 334 10*3/uL (ref 150–400)
RBC: 4.57 MIL/uL (ref 3.87–5.11)
RBC: 4.77 MIL/uL (ref 3.87–5.11)
RDW: 13.9 % (ref 11.5–15.5)
WBC: 6.4 10*3/uL (ref 4.0–10.5)

## 2011-07-04 LAB — POCT I-STAT TROPONIN I: Troponin i, poc: 0 ng/mL (ref 0.00–0.08)

## 2011-07-04 LAB — TSH: TSH: 2.619 u[IU]/mL (ref 0.350–4.500)

## 2011-07-04 MED ORDER — INSULIN ASPART 100 UNIT/ML ~~LOC~~ SOLN: 0.0000 [IU] | Freq: Every day | SUBCUTANEOUS | Status: DC

## 2011-07-04 MED ORDER — INSULIN ASPART 100 UNIT/ML ~~LOC~~ SOLN: 0.0000 [IU] | Freq: Three times a day (TID) | SUBCUTANEOUS | Status: DC

## 2011-07-04 MED ORDER — ASPIRIN EC 325 MG PO TBEC
325.0000 mg | DELAYED_RELEASE_TABLET | Freq: Every day | ORAL | Status: DC
  Administered 2011-07-04 – 2011-07-05 (×2): 325 mg via ORAL
  Filled 2011-07-04 (×2): qty 1

## 2011-07-04 MED ORDER — ONDANSETRON HCL 4 MG/2ML IJ SOLN
4.0000 mg | Freq: Once | INTRAMUSCULAR | Status: AC
  Administered 2011-07-04: 4 mg via INTRAVENOUS
  Filled 2011-07-04: qty 2

## 2011-07-04 MED ORDER — MORPHINE SULFATE 4 MG/ML IJ SOLN
4.0000 mg | Freq: Once | INTRAMUSCULAR | Status: AC
  Administered 2011-07-04: 4 mg via INTRAVENOUS
  Filled 2011-07-04: qty 1

## 2011-07-04 MED ORDER — NITROGLYCERIN 2 % TD OINT
0.5000 [in_us] | TOPICAL_OINTMENT | Freq: Four times a day (QID) | TRANSDERMAL | Status: DC
  Administered 2011-07-04 – 2011-07-05 (×4): 0.5 [in_us] via TOPICAL
  Filled 2011-07-04: qty 30

## 2011-07-04 MED ORDER — IBUPROFEN 600 MG PO TABS
600.0000 mg | ORAL_TABLET | ORAL | Status: DC | PRN
  Administered 2011-07-04 – 2011-07-05 (×3): 600 mg via ORAL
  Filled 2011-07-04: qty 1

## 2011-07-04 MED ORDER — SODIUM CHLORIDE 0.9 % IJ SOLN
3.0000 mL | Freq: Two times a day (BID) | INTRAMUSCULAR | Status: DC
  Administered 2011-07-04: 3 mL via INTRAVENOUS

## 2011-07-04 MED ORDER — ENOXAPARIN SODIUM 40 MG/0.4ML ~~LOC~~ SOLN
40.0000 mg | SUBCUTANEOUS | Status: DC
  Administered 2011-07-04 – 2011-07-05 (×2): 40 mg via SUBCUTANEOUS
  Filled 2011-07-04 (×2): qty 0.4

## 2011-07-04 MED ORDER — MORPHINE SULFATE 4 MG/ML IJ SOLN
4.0000 mg | INTRAMUSCULAR | Status: DC | PRN
  Administered 2011-07-04 (×2): 4 mg via INTRAVENOUS
  Filled 2011-07-04 (×2): qty 1

## 2011-07-04 MED ORDER — TRAMADOL HCL 50 MG PO TABS
50.0000 mg | ORAL_TABLET | Freq: Three times a day (TID) | ORAL | Status: DC | PRN
  Filled 2011-07-04: qty 1

## 2011-07-04 MED ORDER — SODIUM CHLORIDE 0.9 % IJ SOLN: 3.0000 mL | INTRAMUSCULAR | Status: DC | PRN

## 2011-07-04 MED ORDER — ONDANSETRON HCL 4 MG PO TABS
4.0000 mg | ORAL_TABLET | Freq: Four times a day (QID) | ORAL | Status: DC | PRN
  Administered 2011-07-04: 4 mg via ORAL
  Filled 2011-07-04: qty 1

## 2011-07-04 MED ORDER — SODIUM CHLORIDE 0.9 % IJ SOLN
3.0000 mL | Freq: Two times a day (BID) | INTRAMUSCULAR | Status: DC
  Administered 2011-07-04 – 2011-07-05 (×4): 3 mL via INTRAVENOUS

## 2011-07-04 MED ORDER — LORAZEPAM 0.5 MG PO TABS
0.5000 mg | ORAL_TABLET | Freq: Four times a day (QID) | ORAL | Status: DC | PRN
  Administered 2011-07-04 – 2011-07-05 (×3): 0.5 mg via ORAL
  Filled 2011-07-04 (×3): qty 1

## 2011-07-04 MED ORDER — ONDANSETRON HCL 4 MG/2ML IJ SOLN
4.0000 mg | Freq: Four times a day (QID) | INTRAMUSCULAR | Status: DC | PRN
  Administered 2011-07-04 (×2): 4 mg via INTRAVENOUS
  Filled 2011-07-04 (×2): qty 2

## 2011-07-04 MED ORDER — SODIUM CHLORIDE 0.9 % IV SOLN: 250.0000 mL | INTRAVENOUS | Status: DC | PRN

## 2011-07-04 MED ORDER — IOHEXOL 350 MG/ML SOLN
100.0000 mL | Freq: Once | INTRAVENOUS | Status: AC | PRN
  Administered 2011-07-04: 100 mL via INTRAVENOUS

## 2011-07-04 MED ORDER — MORPHINE SULFATE 2 MG/ML IJ SOLN: 2.0000 mg | INTRAMUSCULAR | Status: DC | PRN

## 2011-07-04 NOTE — Progress Notes (Signed)
Patient ID: Patricia [name], female   DOB: Mar 23, 1975, 36 y.o.   MRN: [id number] I found her stress Myoview. It was performed at the hospital and is in [place]. It was unremarkable. It was indeed last year in February of 2012.  No change in recommendations for now.

## 2011-07-04 NOTE — ED Provider Notes (Signed)
History     CSN: [id number]  Arrival date & time 07/04/11  0012   First MD Initiated Contact with Patient 07/04/11 0243      Chief Complaint  Patient presents with  . Chest Pain    (Consider location/radiation/quality/duration/timing/severity/associated sxs/prior treatment) The history is provided by the patient.   chest pain started today. Moderate severity. Left-sided radiates to left arm and left jaw. Has associated shortness of breath. The like something sitting on her chest and pressure-like quality. No history of some her symptoms. Does have history of hypertension, DM, hyperlipidemia. No known history of coronary artery disease. Has never had a stress test. No leg pain or swelling. No history of DVT or PE. No back pain. Trauma. No rash. No cough or fever. No known aggravating or alleviating factors. Took antacid pills prior to arrival without relief.  Past Medical History  Diagnosis Date  . Hypertension   . Diabetes mellitus   . Acid reflux   . High cholesterol     Past Surgical History  Procedure Date  . Abdominal hysterectomy     Family History  Problem Relation Age of Onset  . Hypertension Mother   . Thyroid disease Mother   . Coronary artery disease Father   . Hypertension Father   . Diabetes Father     History  Substance Use Topics  . Smoking status: Never Smoker   . Smokeless tobacco: Not on file  . Alcohol Use: No    OB History    Grav Para Term Preterm Abortions TAB SAB Ect Mult Living                  Review of Systems  Constitutional: Negative for fever and chills.  HENT: Negative for neck pain and neck stiffness.   Eyes: Negative for pain.  Respiratory: Negative for shortness of breath.   Cardiovascular: Positive for chest pain.  Gastrointestinal: Negative for abdominal pain.  Genitourinary: Negative for dysuria.  Musculoskeletal: Negative for back pain.  Skin: Negative for rash.  Neurological: Negative for headaches.  All other  systems reviewed and are negative.    Allergies  Review of patient's allergies indicates no known allergies.  Home Medications   Current Outpatient Rx  Name Route Sig Dispense Refill  . HYDROCHLOROTHIAZIDE 25 MG PO TABS Oral Take 25 mg by mouth daily.    Marland Kitchen LISINOPRIL 40 MG PO TABS Oral Take 40 mg by mouth daily.      Marland Kitchen METFORMIN HCL 500 MG PO TABS Oral Take 500 mg by mouth daily with breakfast.      . OMEPRAZOLE 40 MG PO CPDR Oral Take 40 mg by mouth daily.      Marland Kitchen SIMVASTATIN 10 MG PO TABS Oral Take 10 mg by mouth at bedtime.      BP 106/43  Pulse 65  Temp 98.4 F (36.9 C) (Oral)  Resp 20  SpO2 100%  Physical Exam  Constitutional: She is oriented to person, place, and time. She appears well-developed and well-nourished.  HENT:  Head: Normocephalic and atraumatic.  Eyes: Conjunctivae and EOM are normal. Pupils are equal, round, and reactive to light.  Neck: Trachea normal. Neck supple. No thyromegaly present.  Cardiovascular: Normal rate, regular rhythm, S1 normal, S2 normal and normal pulses.     No systolic murmur is present   No diastolic murmur is present  Pulses:      Radial pulses are 2+ on the right side, and 2+ on the left side.  Pulmonary/Chest: Effort normal and breath sounds normal. She has no wheezes. She has no rhonchi. She has no rales. She exhibits no tenderness.  Abdominal: Soft. Normal appearance and bowel sounds are normal. There is no tenderness. There is no CVA tenderness and negative Murphy's sign.  Musculoskeletal:       BLE:s Calves nontender, no cords or erythema, negative Homans sign  Neurological: She is alert and oriented to person, place, and time. She has normal strength. No cranial nerve deficit or sensory deficit. GCS eye subscore is 4. GCS verbal subscore is 5. GCS motor subscore is 6.  Skin: Skin is warm and dry. No rash noted. She is not diaphoretic.  Psychiatric: Her speech is normal.       Cooperative and appropriate    ED Course    Procedures (including critical care time)  Results for orders placed during the hospital encounter of 07/04/11  CBC      Component Value Range   WBC 7.4  4.0 - 10.5 K/uL   RBC 4.77  3.87 - 5.11 MIL/uL   Hemoglobin 11.8 (*) 12.0 - 15.0 g/dL   HCT 36.3  36.0 - 46.0 %   MCV 76.1 (*) 78.0 - 100.0 fL   MCH 24.7 (*) 26.0 - 34.0 pg   MCHC 32.5  30.0 - 36.0 g/dL   RDW 13.9  11.5 - 15.5 %   Platelets 334  150 - 400 K/uL  BASIC METABOLIC PANEL      Component Value Range   Sodium 134 (*) 135 - 145 mEq/L   Potassium 4.1  3.5 - 5.1 mEq/L   Chloride 100  96 - 112 mEq/L   CO2 25  19 - 32 mEq/L   Glucose, Bld 114 (*) 70 - 99 mg/dL   BUN 10  6 - 23 mg/dL   Creatinine, Ser 0.58  0.50 - 1.10 mg/dL   Calcium 9.6  8.4 - 10.5 mg/dL   GFR calc non Af Amer >90  >90 mL/min   GFR calc Af Amer >90  >90 mL/min  POCT I-STAT TROPONIN I      Component Value Range   Troponin i, poc 0.00  0.00 - 0.08 ng/mL   Comment 3           D-DIMER, QUANTITATIVE      Component Value Range   D-Dimer, Quant <0.22  0.00 - 0.48 ug/mL-FEU  HEMOGLOBIN A1C      Component Value Range   Hemoglobin A1C 6.2 (*) <5.7 %   Mean Plasma Glucose 131 (*) <117 mg/dL  CBC      Component Value Range   WBC 6.4  4.0 - 10.5 K/uL   RBC 4.57  3.87 - 5.11 MIL/uL   Hemoglobin 11.5 (*) 12.0 - 15.0 g/dL   HCT 34.9 (*) 36.0 - 46.0 %   MCV 76.4 (*) 78.0 - 100.0 fL   MCH 25.2 (*) 26.0 - 34.0 pg   MCHC 33.0  30.0 - 36.0 g/dL   RDW 13.9  11.5 - 15.5 %   Platelets 303  150 - 400 K/uL  BASIC METABOLIC PANEL      Component Value Range   Sodium 131 (*) 135 - 145 mEq/L   Potassium 3.6  3.5 - 5.1 mEq/L   Chloride 96  96 - 112 mEq/L   CO2 26  19 - 32 mEq/L   Glucose, Bld 96  70 - 99 mg/dL   BUN 7  6 - 23 mg/dL   Creatinine,  Ser 0.57  0.50 - 1.10 mg/dL   Calcium 8.8  8.4 - 63.4 mg/dL   GFR calc non Af Amer >90  >90 mL/min   GFR calc Af Amer >90  >90 mL/min  TSH      Component Value Range   TSH 2.619  0.350 - 4.500 uIU/mL  CARDIAC  PANEL(CRET KIN+CKTOT+MB+TROPI)      Component Value Range   Total CK 126  7 - 177 U/L   CK, MB 2.8  0.3 - 4.0 ng/mL   Troponin I <0.30  <0.30 ng/mL   Relative Index 2.2  0.0 - 2.5  CARDIAC PANEL(CRET KIN+CKTOT+MB+TROPI)      Component Value Range   Total CK 110  7 - 177 U/L   CK, MB 2.7  0.3 - 4.0 ng/mL   Troponin I <0.30  <0.30 ng/mL   Relative Index 2.5  0.0 - 2.5  CARDIAC PANEL(CRET KIN+CKTOT+MB+TROPI)      Component Value Range   Total CK 96  7 - 177 U/L   CK, MB 2.5  0.3 - 4.0 ng/mL   Troponin I <0.30  <0.30 ng/mL   Relative Index RELATIVE INDEX IS INVALID  0.0 - 2.5  GLUCOSE, CAPILLARY      Component Value Range   Glucose-Capillary 97  70 - 99 mg/dL  GLUCOSE, CAPILLARY      Component Value Range   Glucose-Capillary 107 (*) 70 - 99 mg/dL  GLUCOSE, CAPILLARY      Component Value Range   Glucose-Capillary 87  70 - 99 mg/dL  GLUCOSE, CAPILLARY      Component Value Range   Glucose-Capillary 93  70 - 99 mg/dL   Ct Angio Chest W/cm &/or Wo Cm  07/04/2011  *RADIOLOGY REPORT*  Clinical Data: Shortness of breath, chest pain.  CT ANGIOGRAPHY CHEST  Technique:  Multidetector CT imaging of the chest using the standard protocol during bolus administration of intravenous contrast. Multiplanar reconstructed images including MIPs were obtained and reviewed to evaluate the vascular anatomy.  Contrast: OMNIPAQUE IOHEXOL 350 MG/ML SOLN  Comparison: 07/04/2011 radiograph and 90 08/24/2009 CT  Findings: The contrast bolus timing is suboptimal.  No main or lobar branch pulmonary arterial filling defect identified.  More peripheral branches are inadequately evaluated.  Normal caliber aorta but limited evaluation given the absence of contrast. Size upper normal to mildly enlarged.  No pleural or pericardial effusion.  No enlarged intrathoracic lymph nodes.  Limited images through the upper abdomen show no acute abnormality. Small hiatal hernia.  Central airways are patent.  Minimal dependent  atelectasis.  No pneumothorax.  No acute osseous finding.  IMPRESSION: No pulmonary embolism identified within the main or lobar branches. The more peripheral branches are poorly opacified.  Minimal dependent atelectasis.  Heart size upper normal to mildly enlarged.  No acute intrathoracic process otherwise identified.  Original Report Authenticated By: Waneta Martins, M.D.   Dg Chest Portable 1 View  07/04/2011  *RADIOLOGY REPORT*  Clinical Data: Mid chest pain, shortness of breath.  PORTABLE CHEST - 1 VIEW  Comparison: 12/01/2010  Findings: Hypoaeration results in interstitial and vascular crowding.  Allowing for this, no definite focal consolidation, pleural effusion, or pneumothorax.  Cardiomediastinal contours within normal range.  No acute osseous finding.  IMPRESSION: Hypoaeration results in interstitial and vascular crowding.  No definite focal consolidation.  Original Report Authenticated By: Waneta Martins, M.D.      Date: 07/04/2011  Rate: 75  Rhythm: normal sinus rhythm  QRS Axis:  normal  Intervals: normal  ST/T Wave abnormalities: nonspecific ST changes  Conduction Disutrbances:none  Narrative Interpretation:   Old EKG Reviewed: none available   Aspirin. Nitroglycerin. Morphine IV. Stat EKG reviewed as above. Labs and chest x-ray reviewed as above.   MDM   Chest pain with history concerning for ACS.  Improving pain is likely posterior morphine but still present on recheck. Medicine consult for admission. Case discussed with Dr. Orvan Falconer who evaluated patient recommend CT angiogram prior to admission. CT reviewed as above, pain improving and plan medical admission.      Teressa Lower, MD 07/04/11 2308

## 2011-07-04 NOTE — ED Notes (Signed)
Pt states she is having chest pain that started earlier today time unknown  Pt states with the pain she has shortness of breath and nausea  Shortness of breath worse when she lays down  Pt states she thought it was indigestion so she took omeprazole without relief  Pt states pain is a pressure that radiates into her left arm and jaw  Pt states she also feels weak and tired

## 2011-07-04 NOTE — Progress Notes (Signed)
Patient ID: Patricia [name], female   DOB: 1975-05-25, 36 y.o.   MRN: [id number]   CARDIOLOGY CONSULT NOTE  Patient ID: Theona Muhs MRN: [id number], DOB/AGE: 1975/01/26   Admit date: 07/04/2011 Date of Consult: 07/04/2011   Primary Physician: Provider Not In System Primary Cardiologist: none  Pt. Profile  Mrs. Istre is a 36 year old black female that we've been asked to see for chest discomfort.  Problem List  Past Medical History  Diagnosis Date  . Hypertension   . Diabetes mellitus   . Acid reflux   . High cholesterol     Past Surgical History  Procedure Date  . Abdominal hysterectomy      Allergies  No Known Allergies  HPI   She began having chest discomfort over her left breast yesterday morning about 10 AM. Described as an ache and pressure. He has some sweatiness with it. It lasted all day. Last night when she went to bed, he became more severe and moved up into her left neck and left arm. It lasted for about 45 minutes before she sought medical attention. She states that she was relieved with morphine. She states she was not given nitroglycerin in the emergency room.  She apparently had a stress Myoview about a year ago which can be found. He was told it was normal. However, she can't remember the cardiologist she saw.  Her risk factors include morbid obesity, diabetes for about 3 years which has been borderline, hypertension for 7 years and hyperlipidemia.  She is a history of acid reflux.  Her initial enzymes are negative and her EKG was unremarkable. He is currently pain-free.  He states that at times she occasionally has some chest tightness with activity.  , Inpatient Medications     . aspirin EC  325 mg Oral Daily  . enoxaparin  40 mg Subcutaneous Q24H  . insulin aspart  0-15 Units Subcutaneous TID WC  . insulin aspart  0-5 Units Subcutaneous QHS  .  morphine injection  4 mg Intravenous Once  .  morphine injection  4 mg Intravenous Once  .  nitroGLYCERIN  0.5 inch Topical Q6H  . ondansetron (ZOFRAN) IV  4 mg Intravenous Once  . sodium chloride  3 mL Intravenous Q12H  . sodium chloride  3 mL Intravenous Q12H  . sodium chloride  3 mL Intravenous Q12H    Family History Family History  Problem Relation Age of Onset  . Hypertension Mother   . Thyroid disease Mother   . Coronary artery disease Father   . Hypertension Father   . Diabetes Father      Social History History   Social History  . Marital Status: Single    Spouse Name: N/A    Number of Children: N/A  . Years of Education: N/A   Occupational History  . Not on file.   Social History Main Topics  . Smoking status: Never Smoker   . Smokeless tobacco: Not on file  . Alcohol Use: No  . Drug Use: No  . Sexually Active:    Other Topics Concern  . Not on file   Social History Narrative  . No narrative on file     Review of Systems  General:  No chills, fever, night sweats or weight changes.  Cardiovascular:  No chest pain, dyspnea on exertion, edema, orthopnea, palpitations, paroxysmal nocturnal dyspnea. Dermatological: No rash, lesions/masses Respiratory: No cough, dyspnea Urologic: No hematuria, dysuria Abdominal:   No nausea, vomiting, diarrhea, bright red blood per  rectum, melena, or hematemesis Neurologic:  No visual changes, wkns, changes in mental status. All other systems reviewed and are otherwise negative except as noted above.  Physical Exam  Blood pressure 103/68, pulse 58, temperature 97.7 F (36.5 C), temperature source Oral, resp. rate 20, height $RemoveBe'5\' 8"'ouSDeSJyg$  (1.727 m), weight 266 lb 8.6 oz (120.9 kg), SpO2 100.00%.  General: Pleasant, NAD, soft spoken, morbidly obese Psych: Normal affect, mildly depressed Neuro: Alert and oriented X 3. Moves all extremities spontaneously. HEENT: Normal  Neck: Supple without bruits or JVD. Lungs:  Resp regular and unlabored, CTA. Heart: RRR no s3, s4, or murmurs. Abdomen . Extremities: No clubbing,  cyanosis or edema. DP/PT/Radials 2+ and equal bilaterally.  Labs   [facility] 07/04/11 1315 07/04/11 0717  CKTOTAL 110 126  CKMB 2.7 2.8  TROPONINI <0.30 <0.30   Lab Results  Component Value Date   WBC 6.4 07/04/2011   HGB 11.5* 07/04/2011   HCT 34.9* 07/04/2011   MCV 76.4* 07/04/2011   PLT 303 07/04/2011    Lab 07/04/11 0717  NA 131*  K 3.6  CL 96  CO2 26  BUN 7  CREATININE 0.57  CALCIUM 8.8  PROT --  BILITOT --  ALKPHOS --  ALT --  AST --  GLUCOSE 96   Lab Results  Component Value Date   CHOL  Value: 175        ATP III CLASSIFICATION:  <200     mg/dL   Desirable  200-239  mg/dL   Borderline High  >=240    mg/dL   High        02/26/2010   HDL 33* 02/26/2010   LDLCALC  Value: 118        Total Cholesterol/HDL:CHD Risk Coronary Heart Disease Risk Table                     Men   Women  1/2 Average Risk   3.4   3.3  Average Risk       5.0   4.4  2 X Average Risk   9.6   7.1  3 X Average Risk  23.4   11.0        Use the calculated Patient Ratio above and the CHD Risk Table to determine the patient's CHD Risk.        ATP III CLASSIFICATION (LDL):  <100     mg/dL   Optimal  100-129  mg/dL   Near or Above                    Optimal  130-159  mg/dL   Borderline  160-189  mg/dL   High  >190     mg/dL   Very High* 02/26/2010   TRIG 122 02/26/2010   Lab Results  Component Value Date   DDIMER <0.22 07/04/2011    Radiology/Studies  Ct Angio Chest W/cm &/or Wo Cm  07/04/2011  *RADIOLOGY REPORT*  Clinical Data: Shortness of breath, chest pain.  CT ANGIOGRAPHY CHEST  Technique:  Multidetector CT imaging of the chest using the standard protocol during bolus administration of intravenous contrast. Multiplanar reconstructed images including MIPs were obtained and reviewed to evaluate the vascular anatomy.  Contrast: 164mL OMNIPAQUE IOHEXOL 350 MG/ML SOLN  Comparison: 07/04/2011 radiograph and 90 08/24/2009 CT  Findings: The contrast bolus timing is suboptimal.  No main or lobar branch pulmonary  arterial filling defect identified.  More peripheral branches are inadequately evaluated.  Normal caliber aorta but  limited evaluation given the absence of contrast. Size upper normal to mildly enlarged.  No pleural or pericardial effusion.  No enlarged intrathoracic lymph nodes.  Limited images through the upper abdomen show no acute abnormality. Small hiatal hernia.  Central airways are patent.  Minimal dependent atelectasis.  No pneumothorax.  No acute osseous finding.  IMPRESSION: No pulmonary embolism identified within the main or lobar branches. The more peripheral branches are poorly opacified.  Minimal dependent atelectasis.  Heart size upper normal to mildly enlarged.  No acute intrathoracic process otherwise identified.  Original Report Authenticated By: Suanne Marker, M.D.   Dg Chest Portable 1 View  07/04/2011  *RADIOLOGY REPORT*  Clinical Data: Mid chest pain, shortness of breath.  PORTABLE CHEST - 1 VIEW  Comparison: 12/01/2010  Findings: Hypoaeration results in interstitial and vascular crowding.  Allowing for this, no definite focal consolidation, pleural effusion, or pneumothorax.  Cardiomediastinal contours within normal range.  No acute osseous finding.  IMPRESSION: Hypoaeration results in interstitial and vascular crowding.  No definite focal consolidation.  Original Report Authenticated By: Suanne Marker, M.D.    ECG  Normal sinus rhythm, no ST segment changes  ASSESSMENT AND PLAN  With her having chest discomfort although yesterday and having negative enzymes and negative EKG, it is unlikely that she has an acute coronary syndrome. However, she does have multiple risk factors but still is only 36 years of age. She has no sign of vascular disease by exam.  We'll continue to cycle enzymes. I'll repeat EKG in the morning. I'll try to find her stress test. Thank for the consultation. We'll see again in the morning and discuss further.  Signed, Jenell Milliner, MD 07/04/2011,  4:06 PM

## 2011-07-04 NOTE — H&P (Signed)
PCP: [facility]   Chief Complaint: Left-sided chest pain radiating to her jaw and left arm with shortness of breath.   HPI: Patricia [name] is an 36 y.o. female with history of morbid obesity, hypertension, diabetes, hypercholesterolemia, in her usual state of health until this morning when she suddenly experienced left-sided chest pain radiating to her left jaw and left shoulder. She also experienced mild shortness of breath. She denied nausea vomiting but says she has some diaphoresis. She denied any recent surgery, home on replacement therapy, or any long travel. Evaluation in emergency room included an EKG which showed no acute ST-T changes, no tachycardia. Her d-dimer was negative at less than 0.22, her BUN and creatinine to normal. She has a normal white count 7.4 thousand, hemoglobin of 11.8. In talking to her I was also concerned about pulmonary embolism, despite her negative d-dimer, and thus a CT pulmonary angiogram was performed which exclude a PE as well. Hospitalist was asked to admit her for rule out.  Rewiew of Systems:  The patient denies anorexia, fever, weight loss,, vision loss, decreased hearing, hoarseness, syncope, , peripheral edema, balance deficits, hemoptysis, abdominal pain, melena, hematochezia, severe indigestion/heartburn, hematuria, incontinence, genital sores, muscle weakness, suspicious skin lesions, transient blindness, difficulty walking, depression, unusual weight change, abnormal bleeding, enlarged lymph nodes, angioedema, and breast masses.   Past Medical History  Diagnosis Date  . Hypertension   . Diabetes mellitus   . Acid reflux   . High cholesterol     Past Surgical History  Procedure Date  . Abdominal hysterectomy     Medications:  HOME MEDS: Prior to Admission medications   Medication Sig Start Date End Date Taking? Authorizing Provider  hydrochlorothiazide (HYDRODIURIL) 25 MG tablet Take 25 mg by mouth daily.   Yes Historical Provider,  MD  lisinopril (PRINIVIL,ZESTRIL) 40 MG tablet Take 40 mg by mouth daily.     Yes Historical Provider, MD  metFORMIN (GLUCOPHAGE) 500 MG tablet Take 500 mg by mouth daily with breakfast.     Yes Historical Provider, MD  omeprazole (PRILOSEC) 40 MG capsule Take 40 mg by mouth daily.     Yes Historical Provider, MD  simvastatin (ZOCOR) 10 MG tablet Take 10 mg by mouth at bedtime.   Yes Historical Provider, MD     Allergies:  No Known Allergies  Social History:   reports that she has never smoked. She does not have any smokeless tobacco history on file. She reports that she does not drink alcohol or use illicit drugs.  Family History: Family History  Problem Relation Age of Onset  . Hypertension Mother   . Thyroid disease Mother   . Coronary artery disease Father   . Hypertension Father   . Diabetes Father      Physical Exam: Filed Vitals:   07/04/11 0021 07/04/11 0030 07/04/11 0145 07/04/11 0310  BP: 116/69 128/72 102/61 106/43  Pulse: 77 74 77 65  Temp: 98.4 F (36.9 C)     TempSrc: Oral     Resp: $Remo'18 14 16 20  'WBzQC$ SpO2: 100% 100% 100% 100%   Blood pressure 106/43, pulse 65, temperature 98.4 F (36.9 C), temperature source Oral, resp. rate 20, SpO2 100.00%.  GEN:  Pleasant person lying in the stretcher in no acute distress; cooperative with exam PSYCH:  alert and oriented x4; does not appear anxious or depressed; affect is appropriate. HEENT: Mucous membranes pink and anicteric; PERRLA; EOM intact; no cervical lymphadenopathy nor thyromegaly or carotid bruit; no JVD; Breasts:: Not  examined CHEST WALL: No tenderness CHEST: Normal respiration, clear to auscultation bilaterally HEART: Regular rate and rhythm; no murmurs rubs or gallops BACK: No kyphosis or scoliosis; no CVA tenderness ABDOMEN: Obese, soft non-tender; no masses, no organomegaly, normal abdominal bowel sounds; no pannus; no intertriginous candida. Rectal Exam: Not done EXTREMITIES: No bone or joint deformity;  age-appropriate arthropathy of the hands and knees; no edema; no ulcerations. Genitalia: not examined PULSES: 2+ and symmetric SKIN: Normal hydration no rash or ulceration CNS: Cranial nerves 2-12 grossly intact no focal lateralizing neurologic deficit   Labs & Imaging Results for orders placed during the hospital encounter of 07/04/11 (from the past 48 hour(s))  CBC     Status: Abnormal   Collection Time   07/04/11 12:45 AM      Component Value Range Comment   WBC 7.4  4.0 - 10.5 K/uL    RBC 4.77  3.87 - 5.11 MIL/uL    Hemoglobin 11.8 (*) 12.0 - 15.0 g/dL    HCT 36.3  36.0 - 46.0 %    MCV 76.1 (*) 78.0 - 100.0 fL    MCH 24.7 (*) 26.0 - 34.0 pg    MCHC 32.5  30.0 - 36.0 g/dL    RDW 13.9  11.5 - 15.5 %    Platelets 334  150 - 400 K/uL   BASIC METABOLIC PANEL     Status: Abnormal   Collection Time   07/04/11 12:45 AM      Component Value Range Comment   Sodium 134 (*) 135 - 145 mEq/L    Potassium 4.1  3.5 - 5.1 mEq/L SLIGHT HEMOLYSIS   Chloride 100  96 - 112 mEq/L    CO2 25  19 - 32 mEq/L    Glucose, Bld 114 (*) 70 - 99 mg/dL    BUN 10  6 - 23 mg/dL    Creatinine, Ser 0.58  0.50 - 1.10 mg/dL    Calcium 9.6  8.4 - 10.5 mg/dL    GFR calc non Af Amer >90  >90 mL/min    GFR calc Af Amer >90  >90 mL/min   D-DIMER, QUANTITATIVE     Status: Normal   Collection Time   07/04/11 12:45 AM      Component Value Range Comment   D-Dimer, Quant <0.22  0.00 - 0.48 ug/mL-FEU   POCT I-STAT TROPONIN I     Status: Normal   Collection Time   07/04/11  1:03 AM      Component Value Range Comment   Troponin i, poc 0.00  0.00 - 0.08 ng/mL    Comment 3             Ct Angio Chest W/cm &/or Wo Cm  07/04/2011  *RADIOLOGY REPORT*  Clinical Data: Shortness of breath, chest pain.  CT ANGIOGRAPHY CHEST  Technique:  Multidetector CT imaging of the chest using the standard protocol during bolus administration of intravenous contrast. Multiplanar reconstructed images including MIPs were obtained and reviewed to  evaluate the vascular anatomy.  Contrast: 153mL OMNIPAQUE IOHEXOL 350 MG/ML SOLN  Comparison: 07/04/2011 radiograph and 90 08/24/2009 CT  Findings: The contrast bolus timing is suboptimal.  No main or lobar branch pulmonary arterial filling defect identified.  More peripheral branches are inadequately evaluated.  Normal caliber aorta but limited evaluation given the absence of contrast. Size upper normal to mildly enlarged.  No pleural or pericardial effusion.  No enlarged intrathoracic lymph nodes.  Limited images through the upper abdomen show no acute  abnormality. Small hiatal hernia.  Central airways are patent.  Minimal dependent atelectasis.  No pneumothorax.  No acute osseous finding.  IMPRESSION: No pulmonary embolism identified within the main or lobar branches. The more peripheral branches are poorly opacified.  Minimal dependent atelectasis.  Heart size upper normal to mildly enlarged.  No acute intrathoracic process otherwise identified.  Original Report Authenticated By: Suanne Marker, M.D.   Dg Chest Portable 1 View  07/04/2011  *RADIOLOGY REPORT*  Clinical Data: Mid chest pain, shortness of breath.  PORTABLE CHEST - 1 VIEW  Comparison: 12/01/2010  Findings: Hypoaeration results in interstitial and vascular crowding.  Allowing for this, no definite focal consolidation, pleural effusion, or pneumothorax.  Cardiomediastinal contours within normal range.  No acute osseous finding.  IMPRESSION: Hypoaeration results in interstitial and vascular crowding.  No definite focal consolidation.  Original Report Authenticated By: Suanne Marker, M.D.      Assessment Present on Admission:  .Chest pain .Shortness of breath .Morbid obesity .HTN (hypertension) .DM (diabetes mellitus) .Hypercholesterolemia   PLAN:  Chest pain is atypical but concerning. We'll admit her to telemetry for rule out with serial CPKs and troponins. She certainly has increased risk factors for coronary disease  including diabetes, morbid obesity, hypertension, hypercholesterolemia, sedentary life style, except that she is quite young. Will obtain serial CPKs and troponins. Start her on an aspirin a day, and continue her blood pressure, cholesterol, and diabetes medications. She is stable, full code, and will be admitted to [facility]. Will obtain a cardiac echo to evaluate wall motion abnormality and any hypertensive cardiomyopathy.   Other plans as per orders.    Patricia [name] 07/04/2011, 4:42 AM

## 2011-07-04 NOTE — Progress Notes (Signed)
Pt c/o L-sided chest pain 7/10. Pt educated about CP and Nitroglycerin. Pt refuses Nitro at this time. Pt given prn morphine for pain. Barbee Shropshire. Brigitte Pulse, RN

## 2011-07-04 NOTE — Progress Notes (Signed)
I agree with the HPI per Dr. Marin Comment She has had pleurisy before, but does c/o today of Mid L chested CP with rad to L arm/NEck Morphine makes her nauseous and sleepy She appears to have had a Lexicscan last yr by Velora Heckler which was reportedly normal Cycle CE, rpt EKg and reassess. Might call cardiology if she rules in.  Start nitro-if no relief, would be less likely to be cardiogenic Verneita Griffes, MD [facility] [phone]

## 2011-07-04 NOTE — Progress Notes (Signed)
  Echocardiogram 2D Echocardiogram has been performed.  Patricia [name] 07/04/2011, 4:03 PM

## 2011-07-04 NOTE — ED Notes (Signed)
Pt c/o left sided chest pain that began this morning that radiates down left arm and to jaw. Pt also c/o SOB with activity. Pt states "it feels like something is sitting on my chest". Pt also c/o nausea but denies vomiting. Pt has hx of hypertension but states she is compliant with her meds.

## 2011-07-05 DIAGNOSIS — I10 Essential (primary) hypertension: Secondary | ICD-10-CM

## 2011-07-05 DIAGNOSIS — R079 Chest pain, unspecified: Secondary | ICD-10-CM

## 2011-07-05 DIAGNOSIS — IMO0001 Reserved for inherently not codable concepts without codable children: Secondary | ICD-10-CM

## 2011-07-05 DIAGNOSIS — E1165 Type 2 diabetes mellitus with hyperglycemia: Secondary | ICD-10-CM

## 2011-07-05 MED ORDER — LORAZEPAM 0.5 MG PO TABS: 0.5000 mg | ORAL_TABLET | Freq: Four times a day (QID) | ORAL | Status: AC | PRN

## 2011-07-05 MED ORDER — TRAMADOL HCL 50 MG PO TABS: 50.0000 mg | ORAL_TABLET | Freq: Three times a day (TID) | ORAL | Status: AC | PRN

## 2011-07-05 MED ORDER — IBUPROFEN 600 MG PO TABS: 600.0000 mg | ORAL_TABLET | ORAL | Status: AC | PRN

## 2011-07-05 NOTE — Progress Notes (Signed)
Pt discharged at this time. Pt alert, oriented, and without c/o. No s/s of acute distress noted. Discharge instructions/prescriptions given/explained with pt verbalizing understanding. Pt made aware of importance of followup appointment with doctor of her choice. Pt's daughter at her side.

## 2011-07-05 NOTE — Discharge Summary (Addendum)
[facility] Discharge Summary  Final diag=Noncardiac CP  Date of Admission: 07/04/2011 12:15 AM Admitter: $RemoveBefo'@ADMITPROV'oaoTvjrYCum$ @   Date of Discharge6/30/2013 Attending Physician: Nita Sells, MD  Things to Follow-up on: Needs Weight loss counselling    Patricia [name] is an 36 y.o. female with history of morbid obesity, hypertension, diabetes, hypercholesterolemia, in her usual state of health until this morning when she suddenly experienced left-sided chest pain radiating to her left jaw and left shoulder. She also experienced mild shortness of breath. She denied nausea vomiting but says she has some diaphoresis. She denied any recent surgery, home on replacement therapy, or any long travel. Evaluation in emergency room included an EKG which showed no acute ST-T changes, no tachycardia. Her d-dimer was negative at less than 0.22, her BUN and creatinine to normal. She has a normal white count 7.4 thousand, hemoglobin of 11.8. In talking to her I was also concerned about pulmonary embolism, despite her negative d-dimer, and thus a CT pulmonary angiogram was performed which exclude a PE as well. Hospitalist was asked to admit her for rule out.  She continued to have chest pain-Cardiologist consulted Echo showed EF 55-60% with no regional wall motion anomalies SOB and CP improved on Ibuprofen/Ativan Rpt EKG showed no significant changes from pior  Patient felt to have maximally benefited from in-patient stay and d/c home, advised close-follow up by PCP     Procedures Performed and pertinent labs: Ct Angio Chest W/cm &/or Wo Cm  07/04/2011  *RADIOLOGY REPORT*  Clinical Data: Shortness of breath, chest pain.  CT ANGIOGRAPHY CHEST  Technique:  Multidetector CT imaging of the chest using the standard protocol during bolus administration of intravenous contrast. Multiplanar reconstructed images including MIPs were obtained and reviewed to evaluate the vascular anatomy.  Contrast: 170mL  OMNIPAQUE IOHEXOL 350 MG/ML SOLN  Comparison: 07/04/2011 radiograph and 90 08/24/2009 CT  Findings: The contrast bolus timing is suboptimal.  No main or lobar branch pulmonary arterial filling defect identified.  More peripheral branches are inadequately evaluated.  Normal caliber aorta but limited evaluation given the absence of contrast. Size upper normal to mildly enlarged.  No pleural or pericardial effusion.  No enlarged intrathoracic lymph nodes.  Limited images through the upper abdomen show no acute abnormality. Small hiatal hernia.  Central airways are patent.  Minimal dependent atelectasis.  No pneumothorax.  No acute osseous finding.  IMPRESSION: No pulmonary embolism identified within the main or lobar branches. The more peripheral branches are poorly opacified.  Minimal dependent atelectasis.  Heart size upper normal to mildly enlarged.  No acute intrathoracic process otherwise identified.  Original Report Authenticated By: Suanne Marker, M.D.   Dg Chest Portable 1 View  07/04/2011  *RADIOLOGY REPORT*  Clinical Data: Mid chest pain, shortness of breath.  PORTABLE CHEST - 1 VIEW  Comparison: 12/01/2010  Findings: Hypoaeration results in interstitial and vascular crowding.  Allowing for this, no definite focal consolidation, pleural effusion, or pneumothorax.  Cardiomediastinal contours within normal range.  No acute osseous finding.  IMPRESSION: Hypoaeration results in interstitial and vascular crowding.  No definite focal consolidation.  Original Report Authenticated By: Suanne Marker, M.D.    Discharge Vitals & PE:  BP 131/75  Pulse 73  Temp 98.4 F (36.9 C) (Oral)  Resp 16  Ht $R'5\' 8"'hy$  (1.727 m)  Wt 120.9 kg (266 lb 8.6 oz)  BMI 40.53 kg/m2  SpO2 99% Alert oriented, no repord CP  Cta b s1 s2 no m/r/g abd soft, obese  Discharge  Labs:  Results for orders placed during the hospital encounter of 07/04/11 (from the past 24 hour(s))  CARDIAC PANEL(CRET KIN+CKTOT+MB+TROPI)      Status: Normal   Collection Time   07/04/11  1:15 PM      Component Value Range   Total CK 110  7 - 177 U/L   CK, MB 2.7  0.3 - 4.0 ng/mL   Troponin I <0.30  <0.30 ng/mL   Relative Index 2.5  0.0 - 2.5  GLUCOSE, CAPILLARY     Status: Normal   Collection Time   07/04/11  5:16 PM      Component Value Range   Glucose-Capillary 87  70 - 99 mg/dL  CARDIAC PANEL(CRET KIN+CKTOT+MB+TROPI)     Status: Normal   Collection Time   07/04/11  9:05 PM      Component Value Range   Total CK 96  7 - 177 U/L   CK, MB 2.5  0.3 - 4.0 ng/mL   Troponin I <0.30  <0.30 ng/mL   Relative Index RELATIVE INDEX IS INVALID  0.0 - 2.5  GLUCOSE, CAPILLARY     Status: Normal   Collection Time   07/04/11  9:59 PM      Component Value Range   Glucose-Capillary 93  70 - 99 mg/dL  GLUCOSE, CAPILLARY     Status: Normal   Collection Time   07/05/11  8:52 AM      Component Value Range   Glucose-Capillary 96  70 - 99 mg/dL    Disposition and follow-up:   Patricia [name] was discharged from in good condition.    Follow-up Appointments:    Discharge Medications: Medication List  As of 07/05/2011  1:08 PM   TAKE these medications         hydrochlorothiazide 25 MG tablet   Commonly known as: HYDRODIURIL   Take 25 mg by mouth daily.      ibuprofen 600 MG tablet   Commonly known as: ADVIL,MOTRIN   Take 1 tablet (600 mg total) by mouth every 4 (four) hours as needed (if does not work then use Tramadol -- see order).      LORazepam 0.5 MG tablet   Commonly known as: ATIVAN   Take 1 tablet (0.5 mg total) by mouth every 6 (six) hours as needed for anxiety (may give 0.5 mg to 1 mg PO Q 6 hours PRN).      metFORMIN 500 MG tablet   Commonly known as: GLUCOPHAGE   Take 500 mg by mouth daily with breakfast.      omeprazole 40 MG capsule   Commonly known as: PRILOSEC   Take 40 mg by mouth daily.      simvastatin 10 MG tablet   Commonly known as: ZOCOR   Take 10 mg by mouth at bedtime.      traMADol 50 MG  tablet   Commonly known as: ULTRAM   Take 1 tablet (50 mg total) by mouth 3 (three) times daily as needed.         ASK your doctor about these medications         lisinopril 40 MG tablet   Commonly known as: PRINIVIL,ZESTRIL   Take 40 mg by mouth daily.           Medications Discontinued During This Encounter  Medication Reason  . POTASSIUM GLUCONATE PO Discontinued by provider  . morphine 4 MG/ML injection 4 mg     <40 minutes time spent preparing d/c summary,  including direct face-face patient Time, contact with consultants, family and care coordination   Signed: Nita Sells 07/05/2011, 1:08 PM

## 2011-07-05 NOTE — Progress Notes (Signed)
Patient ID: Temprance Wyre, female   DOB: April 08, 1975, 36 y.o.   MRN: [id number]   [name] Date of Encounter: 07/05/2011    SUBJECTIVE  Continues to have chest pressure. SOB with walking to bathroom.  CURRENT MEDS    . aspirin EC  325 mg Oral Daily  . enoxaparin  40 mg Subcutaneous Q24H  . insulin aspart  0-15 Units Subcutaneous TID WC  . insulin aspart  0-5 Units Subcutaneous QHS  . nitroGLYCERIN  0.5 inch Topical Q6H  . sodium chloride  3 mL Intravenous Q12H  . sodium chloride  3 mL Intravenous Q12H  . sodium chloride  3 mL Intravenous Q12H    OBJECTIVE  Filed Vitals:   07/04/11 0500 07/04/11 1455 07/04/11 2201 07/05/11 0300  BP:  103/68 114/71 131/75  Pulse:  58 61 73  Temp:  97.7 F (36.5 C) 98.1 F (36.7 C) 98.4 F (36.9 C)  TempSrc:  Oral Oral Oral  Resp:  $Remo'20 20 16  'KrwMm$ Height:      Weight:      SpO2: 100% 100% 100% 99%    Intake/Output Summary (Last 24 hours) at 07/05/11 0819 Last data filed at 07/05/11 0500  Gross per 24 hour  Intake   2940 ml  Output   3700 ml  Net   -760 ml   Filed Weights   07/04/11 0459  Weight: 266 lb 8.6 oz (120.9 kg)    PHYSICAL EXAM  General: Pleasant, NAD. Neuro: Alert and oriented X 3. Moves all extremities spontaneously. Psych: Normal affect. HEENT:  Normal  Neck: Supple without bruits or JVD. Lungs:  Resp regular and unlabored, CTA. Heart: RRR no s3, s4, or murmurs. Abdomen: Soft, non-tender, non-distended, BS + x 4.  Extremities: No clubbing, cyanosis or edema. DP/PT/Radials 2+ and equal bilaterally.  Accessory Clinical Findings  CBC  Basename 07/04/11 0717 07/04/11 0045  WBC 6.4 7.4  NEUTROABS -- --  HGB 11.5* 11.8*  HCT 34.9* 36.3  MCV 76.4* 76.1*  PLT 303 735   Basic Metabolic Panel  Basename 32/99/24 0717 07/04/11 0045  NA 131* 134*  K 3.6 4.1  CL 96 100  CO2 26 25  GLUCOSE 96 114*  BUN 7 10  CREATININE 0.57 0.58  CALCIUM 8.8 9.6  MG -- --  PHOS -- --   Liver Function  Tests No results found for this basename: AST:2,ALT:2,ALKPHOS:2,BILITOT:2,PROT:2,ALBUMIN:2 in the last 72 hours No results found for this basename: LIPASE:2,AMYLASE:2 in the last 72 hours Cardiac Enzymes  Basename 07/04/11 2105 07/04/11 1315 07/04/11 0717  CKTOTAL 96 110 126  CKMB 2.5 2.7 2.8  CKMBINDEX -- -- --  TROPONINI <0.30 <0.30 <0.30   BNP No components found with this basename: POCBNP:3 D-Dimer  Basename 07/04/11 0045  DDIMER <0.22   Hemoglobin A1C  Basename 07/04/11 0045  HGBA1C 6.2*   Fasting Lipid Panel No results found for this basename: CHOL,HDL,LDLCALC,TRIG,CHOLHDL,LDLDIRECT in the last 72 hours Thyroid Function Tests  Basename 07/04/11 0717  TSH 2.619  T4TOTAL --  T3FREE --  THYROIDAB --    TELE  NSR  ECG  Not done  Radiology/Studies  Ct Angio Chest W/cm &/or Wo Cm  07/04/2011  *RADIOLOGY REPORT*  Clinical Data: Shortness of breath, chest pain.  CT ANGIOGRAPHY CHEST  Technique:  Multidetector CT imaging of the chest using the standard protocol during bolus administration of intravenous contrast. Multiplanar reconstructed images including MIPs were obtained and reviewed to evaluate the vascular anatomy.  Contrast: 132mL OMNIPAQUE IOHEXOL  350 MG/ML SOLN  Comparison: 07/04/2011 radiograph and 90 08/24/2009 CT  Findings: The contrast bolus timing is suboptimal.  No main or lobar branch pulmonary arterial filling defect identified.  More peripheral branches are inadequately evaluated.  Normal caliber aorta but limited evaluation given the absence of contrast. Size upper normal to mildly enlarged.  No pleural or pericardial effusion.  No enlarged intrathoracic lymph nodes.  Limited images through the upper abdomen show no acute abnormality. Small hiatal hernia.  Central airways are patent.  Minimal dependent atelectasis.  No pneumothorax.  No acute osseous finding.  IMPRESSION: No pulmonary embolism identified within the main or lobar branches. The more  peripheral branches are poorly opacified.  Minimal dependent atelectasis.  Heart size upper normal to mildly enlarged.  No acute intrathoracic process otherwise identified.  Original Report Authenticated By: Suanne Marker, M.D.   Dg Chest Portable 1 View  07/04/2011  *RADIOLOGY REPORT*  Clinical Data: Mid chest pain, shortness of breath.  PORTABLE CHEST - 1 VIEW  Comparison: 12/01/2010  Findings: Hypoaeration results in interstitial and vascular crowding.  Allowing for this, no definite focal consolidation, pleural effusion, or pneumothorax.  Cardiomediastinal contours within normal range.  No acute osseous finding.  IMPRESSION: Hypoaeration results in interstitial and vascular crowding.  No definite focal consolidation.  Original Report Authenticated By: Suanne Marker, M.D.    ASSESSMENT AND PLAN  Principal Problem:  *Chest pain Active Problems:  Shortness of breath  Morbid obesity  HTN (hypertension)  DM (diabetes mellitus)  Hypercholesterolemia    I do not think this is cardiac with prolonged discomfort and negative cardiac markers and normal EKG. Will check EKG this am. If unchanged can discharge on secondary preventative therapy. Needs statin with goal LDL of <100, ACEI for HTN and diabetes, ECASA $RemoveBeforeD'81mg'tNjZufyECAHFCk$  a day and major weight loss with walking program. No Cardiology foolowup needed. Close followup with primary care. Discussed with patient.  Signed, Jenell Milliner MD

## 2012-02-28 ENCOUNTER — Emergency Department (HOSPITAL_COMMUNITY): Payer: Managed Care, Other (non HMO)

## 2012-02-28 ENCOUNTER — Emergency Department (HOSPITAL_COMMUNITY)
Admission: EM | Admit: 2012-02-28 | Discharge: 2012-02-28 | Disposition: A | Discharge status: Home / Self Care | Payer: Managed Care, Other (non HMO) | Attending: Emergency Medicine | Admitting: Emergency Medicine

## 2012-02-28 ENCOUNTER — Encounter (HOSPITAL_COMMUNITY): Payer: Self-pay | Admitting: *Deleted

## 2012-02-28 DIAGNOSIS — E7800 Pure hypercholesterolemia, unspecified: Secondary | ICD-10-CM | POA: Insufficient documentation

## 2012-02-28 DIAGNOSIS — R079 Chest pain, unspecified: Secondary | ICD-10-CM

## 2012-02-28 DIAGNOSIS — K219 Gastro-esophageal reflux disease without esophagitis: Secondary | ICD-10-CM | POA: Insufficient documentation

## 2012-02-28 DIAGNOSIS — R609 Edema, unspecified: Secondary | ICD-10-CM | POA: Insufficient documentation

## 2012-02-28 DIAGNOSIS — Z79899 Other long term (current) drug therapy: Secondary | ICD-10-CM | POA: Insufficient documentation

## 2012-02-28 DIAGNOSIS — R110 Nausea: Secondary | ICD-10-CM | POA: Insufficient documentation

## 2012-02-28 DIAGNOSIS — R0789 Other chest pain: Secondary | ICD-10-CM | POA: Insufficient documentation

## 2012-02-28 DIAGNOSIS — I10 Essential (primary) hypertension: Secondary | ICD-10-CM | POA: Insufficient documentation

## 2012-02-28 DIAGNOSIS — E119 Type 2 diabetes mellitus without complications: Secondary | ICD-10-CM | POA: Insufficient documentation

## 2012-02-28 DIAGNOSIS — R0602 Shortness of breath: Secondary | ICD-10-CM | POA: Insufficient documentation

## 2012-02-28 LAB — BASIC METABOLIC PANEL
BUN: 8 mg/dL (ref 6–23)
Calcium: 8.6 mg/dL (ref 8.4–10.5)
GFR calc Af Amer: >90 mL/min (ref >90)
GFR calc non Af Amer: >90 mL/min (ref >90)
Glucose, Bld: 102 mg/dL — ABNORMAL HIGH (ref 70–99)

## 2012-02-28 LAB — CBC WITH DIFFERENTIAL/PLATELET
Basophils Relative: 1 % (ref 0–1)
Eosinophils Absolute: 0.2 10*3/uL (ref 0.0–0.7)
Eosinophils Relative: 3 % (ref 0–5)
Lymphs Abs: 2.4 10*3/uL (ref 0.7–4.0)
MCH: 25.1 pg — ABNORMAL LOW (ref 26.0–34.0)
MCHC: 33.1 g/dL (ref 30.0–36.0)
MCV: 75.6 fL — ABNORMAL LOW (ref 78.0–100.0)
Monocytes Relative: 7 % (ref 3–12)
Platelets: 288 10*3/uL (ref 150–400)
RBC: 4.39 MIL/uL (ref 3.87–5.11)

## 2012-02-28 MED ORDER — ONDANSETRON 4 MG PO TBDP
4.0000 mg | ORAL_TABLET | Freq: Once | ORAL | Status: AC
  Administered 2012-02-28: 4 mg via ORAL

## 2012-02-28 MED ORDER — ASPIRIN 81 MG PO CHEW
324.0000 mg | CHEWABLE_TABLET | Freq: Once | ORAL | Status: AC
  Administered 2012-02-28: 324 mg via ORAL
  Filled 2012-02-28: qty 4

## 2012-02-28 MED ORDER — ONDANSETRON HCL 4 MG/2ML IJ SOLN: 4.0000 mg | Freq: Once | INTRAMUSCULAR | Status: DC

## 2012-02-28 MED ORDER — ONDANSETRON 4 MG PO TBDP
ORAL_TABLET | ORAL | Status: AC
  Administered 2012-02-28: 4 mg via ORAL
  Filled 2012-02-28: qty 1

## 2012-02-28 NOTE — ED Provider Notes (Signed)
History     CSN: [id number]  Arrival date & time 02/28/12  3005   First MD Initiated Contact with Patient 02/28/12 [phone]      Chief Complaint  Patient presents with  . Shortness of Breath  . Chest Pain    (Consider location/radiation/quality/duration/timing/severity/associated sxs/prior treatment) HPI Comments: Patient presents with a chief complaint of chest discomfort.  She reports that she began having substernal chest discomfort around 4 AM this morning when she woke up, six hours prior to arrival in the ED.  Pain has been constant since that time.  She describes it as a "heaviness" and states that it feels like someone is sitting on her chest.  Pain does not radiate.  Nothing makes pain better or worse.  She is also having mild shortness of breath, but states that it is not new.  She denies dizziness or lightheadedness.  Denies cough or hemoptysis.  Denies fever or chills.  She does not have a prior cardiac history.  She was admitted in February of 2012 for chest pain and had a Stress Myoview done at that time, which was negative.  She was again admitted in July 2013 for chest pain and had a negative cardiac work up at that time.  She reports that her chest pain today is not as severe as her pain was at the time of her previous admissions.  She denies prolonged travel or surgeries in the past 4 weeks.  Denies prior history of DVT or PE.  Denies use of any estrogen containing medications.  Denies any previous history of Cancer.  No LE erythema or pain.  She does have bilateral LE edema, which she reports is not new.  PCP is Marketing executive.  Patient is a 37 y.o. female presenting with shortness of breath and chest pain. The history is provided by the patient.  Shortness of Breath Associated symptoms: chest pain   Associated symptoms: no abdominal pain, no cough, no fever, no neck pain, no rash, no vomiting and no wheezing   Chest Pain Associated symptoms: nausea and shortness of breath    Associated symptoms: no abdominal pain, no cough, no dizziness, no fever, no numbness, no palpitations and not vomiting     Past Medical History  Diagnosis Date  . Hypertension   . Diabetes mellitus   . Acid reflux   . High cholesterol     Past Surgical History  Procedure Laterality Date  . Abdominal hysterectomy      Family History  Problem Relation Age of Onset  . Hypertension Mother   . Thyroid disease Mother   . Coronary artery disease Father   . Hypertension Father   . Diabetes Father     History  Substance Use Topics  . Smoking status: Never Smoker   . Smokeless tobacco: Not on file  . Alcohol Use: No    OB History   Grav Para Term Preterm Abortions TAB SAB Ect Mult Living                  Review of Systems  Constitutional: Negative for fever and chills.  HENT: Negative for neck pain.   Respiratory: Positive for shortness of breath. Negative for cough and wheezing.   Cardiovascular: Positive for chest pain and leg swelling. Negative for palpitations.  Gastrointestinal: Positive for nausea. Negative for vomiting and abdominal pain.  Skin: Negative for rash.  Neurological: Negative for dizziness, syncope, light-headedness and numbness.  All other systems reviewed and are negative.  Allergies  Review of patient's allergies indicates no known allergies.  Home Medications   Current Outpatient Rx  Name  Route  Sig  Dispense  Refill  . EQL POTASSIUM GLUCONATE PO   Oral   Take 1 tablet by mouth daily.         . hydrochlorothiazide (HYDRODIURIL) 25 MG tablet   Oral   Take 25 mg by mouth daily.         Marland Kitchen lisinopril (PRINIVIL,ZESTRIL) 40 MG tablet   Oral   Take 40 mg by mouth daily.           . metFORMIN (GLUCOPHAGE) 500 MG tablet   Oral   Take 500 mg by mouth daily with breakfast.           . omeprazole (PRILOSEC) 40 MG capsule   Oral   Take 40 mg by mouth daily.           . simvastatin (ZOCOR) 10 MG tablet   Oral   Take 10 mg  by mouth at bedtime.           BP 124/78  Pulse 61  Temp(Src) 98.4 F (36.9 C)  Resp 16  SpO2 99%  Physical Exam  Nursing note and vitals reviewed. Constitutional: She appears well-developed and well-nourished. No distress.  HENT:  Head: Normocephalic and atraumatic.  Mouth/Throat: Oropharynx is clear and moist.  Neck: Normal range of motion. Neck supple.  Cardiovascular: Normal rate, regular rhythm and normal heart sounds.   Pulmonary/Chest: Effort normal and breath sounds normal. No respiratory distress. She has no wheezes. She has no rales. She exhibits no tenderness.  Abdominal: Soft. Bowel sounds are normal. She exhibits no distension and no mass. There is no tenderness. There is no rebound and no guarding.  Musculoskeletal: Normal range of motion.  1+ pitting edema from the mid shin distally bilaterally.  Neurological: She is alert.  Skin: Skin is warm and dry. She is not diaphoretic.  Psychiatric: She has a normal mood and affect.    ED Course  Procedures (including critical care time)  Labs Reviewed  CBC WITH DIFFERENTIAL - Abnormal; Notable for the following:    Hemoglobin 11.0 (*)    HCT 33.2 (*)    MCV 75.6 (*)    MCH 25.1 (*)    Neutrophils Relative 39 (*)    Lymphocytes Relative 50 (*)    All other components within normal limits  BASIC METABOLIC PANEL - Abnormal; Notable for the following:    Glucose, Bld 102 (*)    All other components within normal limits   Dg Chest 2 View  02/28/2012  *RADIOLOGY REPORT*  Clinical Data:  Chest pain.  CHEST - 2 VIEW  Comparison: 07/04/2011  Findings: The heart size and mediastinal contours are within normal limits.  Both lungs are clear.  The visualized skeletal structures are unremarkable.  IMPRESSION: No active disease.   Original Report Authenticated By: Aletta Edouard, M.D.      No diagnosis found.   Date: 02/28/2012  Rate: 64  Rhythm: normal sinus rhythm  QRS Axis: normal  Intervals: normal  ST/T Wave  abnormalities: nonspecific T wave changes  Conduction Disutrbances:none  Narrative Interpretation:   Old EKG Reviewed: unchanged    MDM  Patient is to be discharged with recommendation to follow up with PCP in regards to today's hospital visit. Chest pain is not likely of cardiac or pulmonary etiology d/t presentation, PERC negative, VSS, no JVD or new murmur, Heart RRR, breath  sounds equal bilaterally, EKG without acute abnormalities, negative troponin, and negative CXR.   Onset of pain six hours prior to arrival in the ED.  Feel that patient is stable for discharge at this time.  Return precautions given to the patient.  Pt appears reliable for follow up and is agreeable to discharge.   Case has been discussed with Dr. Betsey Holiday who agrees with the above plan to discharge.         Sherlyn Lees Edmond, PA-C 02/28/12 1734

## 2012-02-28 NOTE — ED Notes (Signed)
Pt reports having SOB/chest discomfort that started last night.  Pt reports that she had fluid build up in her thighs down.  States that she was SOB before the chest discomfort started.   Pt denies vomiting but states that she was nauseated.  Pt ambulatory.  NAD noted.

## 2012-03-02 NOTE — ED Provider Notes (Signed)
Medical screening examination/treatment/procedure(s) were performed by non-physician practitioner and as supervising physician I was immediately available for consultation/collaboration.  Orpah Greek, MD 03/02/12 [phone]

## 2012-03-23 ENCOUNTER — Emergency Department (HOSPITAL_COMMUNITY)
Admission: EM | Admit: 2012-03-23 | Discharge: 2012-03-24 | Disposition: A | Discharge status: Home / Self Care | Payer: Managed Care, Other (non HMO) | Attending: Emergency Medicine | Admitting: Emergency Medicine

## 2012-03-23 ENCOUNTER — Encounter (HOSPITAL_COMMUNITY): Payer: Self-pay | Admitting: *Deleted

## 2012-03-23 DIAGNOSIS — K219 Gastro-esophageal reflux disease without esophagitis: Secondary | ICD-10-CM | POA: Insufficient documentation

## 2012-03-23 DIAGNOSIS — R109 Unspecified abdominal pain: Secondary | ICD-10-CM | POA: Insufficient documentation

## 2012-03-23 DIAGNOSIS — Z87440 Personal history of urinary (tract) infections: Secondary | ICD-10-CM | POA: Insufficient documentation

## 2012-03-23 DIAGNOSIS — Z79899 Other long term (current) drug therapy: Secondary | ICD-10-CM | POA: Insufficient documentation

## 2012-03-23 DIAGNOSIS — K5732 Diverticulitis of large intestine without perforation or abscess without bleeding: Secondary | ICD-10-CM

## 2012-03-23 DIAGNOSIS — I10 Essential (primary) hypertension: Secondary | ICD-10-CM | POA: Insufficient documentation

## 2012-03-23 DIAGNOSIS — R809 Proteinuria, unspecified: Secondary | ICD-10-CM | POA: Insufficient documentation

## 2012-03-23 DIAGNOSIS — E119 Type 2 diabetes mellitus without complications: Secondary | ICD-10-CM | POA: Insufficient documentation

## 2012-03-23 DIAGNOSIS — R609 Edema, unspecified: Secondary | ICD-10-CM

## 2012-03-23 DIAGNOSIS — R319 Hematuria, unspecified: Secondary | ICD-10-CM | POA: Insufficient documentation

## 2012-03-23 DIAGNOSIS — E7800 Pure hypercholesterolemia, unspecified: Secondary | ICD-10-CM | POA: Insufficient documentation

## 2012-03-23 DIAGNOSIS — R1030 Lower abdominal pain, unspecified: Secondary | ICD-10-CM

## 2012-03-23 LAB — CBC WITH DIFFERENTIAL/PLATELET
Basophils Absolute: 0.1 10*3/uL (ref 0.0–0.1)
Basophils Relative: 1 % (ref 0–1)
Eosinophils Relative: 2 % (ref 0–5)
HCT: 32.5 % — ABNORMAL LOW (ref 36.0–46.0)
Hemoglobin: 11.1 g/dL — ABNORMAL LOW (ref 12.0–15.0)
Lymphocytes Relative: 43 % (ref 12–46)
Monocytes Relative: 7 % (ref 3–12)
Neutro Abs: 3 10*3/uL (ref 1.7–7.7)
RBC: 4.46 MIL/uL (ref 3.87–5.11)
RDW: 13.6 % (ref 11.5–15.5)
WBC: 6.4 10*3/uL (ref 4.0–10.5)

## 2012-03-23 LAB — URINALYSIS, MICROSCOPIC ONLY
Ketones, ur: NEGATIVE mg/dL
Leukocytes, UA: NEGATIVE
Nitrite: NEGATIVE
pH: 6 (ref 5.0–8.0)

## 2012-03-23 LAB — COMPREHENSIVE METABOLIC PANEL
Albumin: 2.1 g/dL — ABNORMAL LOW (ref 3.5–5.2)
BUN: 15 mg/dL (ref 6–23)
Calcium: 8.8 mg/dL (ref 8.4–10.5)
Creatinine, Ser: 0.71 mg/dL (ref 0.50–1.10)
GFR calc Af Amer: >90 mL/min (ref >90)
Glucose, Bld: 88 mg/dL (ref 70–99)
Total Protein: 5.9 g/dL — ABNORMAL LOW (ref 6.0–8.3)

## 2012-03-23 LAB — LIPASE, BLOOD: Lipase: 20 U/L (ref 11–59)

## 2012-03-23 MED ORDER — HYDROCODONE-ACETAMINOPHEN 5-325 MG PO TABS
2.0000 | ORAL_TABLET | Freq: Once | ORAL | Status: AC
  Administered 2012-03-23: 2 via ORAL
  Filled 2012-03-23: qty 2

## 2012-03-23 NOTE — ED Notes (Signed)
Pt reports having lower abd pain, has been getting treated by pcp for UTI x 2 weeks, has taken antibiotics x 2, still having pain so went back to pcp this am. Reports still having hgb and protein in her urine so sent here for further eval.

## 2012-03-23 NOTE — ED Provider Notes (Signed)
History     CSN: [id number]  Arrival date & time 03/23/12  1801   First MD Initiated Contact with Patient 03/23/12 2252      Chief Complaint  Patient presents with  . Abdominal Pain    (Consider location/radiation/quality/duration/timing/severity/associated sxs/prior treatment) HPI Patricia [name] is a 37 y.o. female Who presents with suprapubic pain referred from her primary care physician. Patient says that over the last 3 weeks she's had 2 courses of antibiotics for urinary tract infection and has not gotten any better. Over the course the last 3 days her pain is acutely worsened, since Saturday she says she's got a "hurting feeling" in the suprapubic region, he occasionally gets sharp pains that feel like a stabbing, currently 5/10, it ranges up to 10 out of 10 in intensity, positive nausea, no vomiting, no diarrhea.  Patient is had urinary tract infections in past but they typically respond to Pyridium and antibiotics and this has not. She also says the character of this pain is different than a urinary tract infection. Patient also continues to have lower extremity edema, this has somewhat resolved since the last time she presented to the ER.  Past Medical History  Diagnosis Date  . Hypertension   . Diabetes mellitus   . Acid reflux   . High cholesterol     Past Surgical History  Procedure Laterality Date  . Abdominal hysterectomy      Family History  Problem Relation Age of Onset  . Hypertension Mother   . Thyroid disease Mother   . Coronary artery disease Father   . Hypertension Father   . Diabetes Father     History  Substance Use Topics  . Smoking status: Never Smoker   . Smokeless tobacco: Not on file  . Alcohol Use: No    OB History   Grav Para Term Preterm Abortions TAB SAB Ect Mult Living                  Review of Systems At least 10pt or greater review of systems completed and are negative except where specified in the HPI.  Allergies  Review of  patient's allergies indicates no known allergies.  Home Medications   Current Outpatient Rx  Name  Route  Sig  Dispense  Refill  . EQL POTASSIUM GLUCONATE PO   Oral   Take 1 tablet by mouth daily.         . hydrochlorothiazide (HYDRODIURIL) 25 MG tablet   Oral   Take 25 mg by mouth daily.         Marland Kitchen lisinopril (PRINIVIL,ZESTRIL) 40 MG tablet   Oral   Take 40 mg by mouth daily.           . metFORMIN (GLUCOPHAGE) 500 MG tablet   Oral   Take 500 mg by mouth daily with breakfast.           . omeprazole (PRILOSEC) 40 MG capsule   Oral   Take 40 mg by mouth daily.           . simvastatin (ZOCOR) 10 MG tablet   Oral   Take 10 mg by mouth at bedtime.           BP 129/72  Pulse 79  Temp(Src) 98.2 F (36.8 C) (Oral)  Resp 18  SpO2 100%  Physical Exam  Nursing notes reviewed.  Electronic medical record reviewed. VITAL SIGNS:   Filed Vitals:   03/23/12 1829 03/24/12 0200  BP: 129/72 118/64  Pulse: 79 71  Temp: 98.2 F (36.8 C)   TempSrc: Oral   Resp: 18   SpO2: 100% 97%   CONSTITUTIONAL: Awake, oriented, appears non-toxic HENT: Atraumatic, normocephalic, oral mucosa pink and moist, airway patent. Nares patent without drainage. External ears normal. EYES: Conjunctiva clear, EOMI, PERRLA NECK: Trachea midline, non-tender, supple CARDIOVASCULAR: Normal heart rate, Normal rhythm, No murmurs, rubs, gallops PULMONARY/CHEST: Clear to auscultation, no rhonchi, wheezes, or rales. Symmetrical breath sounds. Non-tender. ABDOMINAL: Non-distended, morbidly obese, soft, tenderness to palpation in the suprapubic region to the left of midline no rebound or guarding.  BS normal. NEUROLOGIC: Non-focal, moving all four extremities, no gross sensory or motor deficits. EXTREMITIES: No clubbing, cyanosis, or edema SKIN: Warm, Dry, No erythema, No rash  ED Course  Procedures (including critical care time)  Labs Reviewed  COMPREHENSIVE METABOLIC PANEL - Abnormal; Notable  for the following:    Total Protein 5.9 (*)    Albumin 2.1 (*)    Total Bilirubin 0.1 (*)    All other components within normal limits  CBC WITH DIFFERENTIAL - Abnormal; Notable for the following:    Hemoglobin 11.1 (*)    HCT 32.5 (*)    MCV 72.9 (*)    MCH 24.9 (*)    All other components within normal limits  URINALYSIS, MICROSCOPIC ONLY - Abnormal; Notable for the following:    Hgb urine dipstick MODERATE (*)    Protein, ur >300 (*)    Bacteria, UA FEW (*)    Squamous Epithelial / LPF MANY (*)    All other components within normal limits  LIPASE, BLOOD  CK   Ct Abdomen Pelvis Wo Contrast  03/24/2012  *RADIOLOGY REPORT*  Clinical Data: Lower abdominal pain and hematuria.  Urinary tract infection for 2 weeks.  Proteinuria.  CT ABDOMEN AND PELVIS WITHOUT CONTRAST  Technique:  Multidetector CT imaging of the abdomen and pelvis was performed following the standard protocol without intravenous contrast.  Comparison: 03/01/2011  Findings: The lung bases are clear.  The kidneys appear symmetrical in size and shape.  No pyelocaliectasis or ureterectasis.  No renal, ureteral, or bladder stones are identified.  The bladder wall is not thickened.  The hepatic parenchyma demonstrates normal density 10-day processed with interval resolution of fatty change since previous study.  The gallbladder, spleen, pancreas, adrenal glands, abdominal aorta, inferior vena cava, and retroperitoneal lymph nodes are unremarkable on unenhanced imaging. The stomach and small bowel are decompressed.  Stool filled colon without distension.  No free air or free fluid in the abdomen.  Small fat containing umbilical/periumbilical hernia.  Pelvis:  The bladder is mostly decompressed and is difficult to evaluate for wall thickness although no specific wall thickening is identified.  No free or loculated pelvic fluid collections.  The uterus appears to be surgically absent.  No abnormal adnexal masses.  There is a prominent  diverticulum at the rectosigmoid junction with some inflammatory change in the pericolonic fat suggesting early diverticulitis.  Focal epiploic appendagitis is not entirely excluded.  The appendix is normal.  No free or loculated pelvic fluid collections.  No significant pelvic lymphadenopathy.  IMPRESSION: No renal or ureteral stone or obstruction demonstrated.  Fat containing umbilical/periumbilical hernia.  Focal area of inflammation around the low sigmoid colon could represent early diverticulitis versus epiploic appendagitis.   Original Report Authenticated By: Lucienne Capers, M.D.      1. Nephrotic range proteinuria   2. Edema   3. Suprapubic pain, unspecified laterality   4. Hematuria  5. Diverticulitis large intestine w/o perforation or abscess w/o bleeding       MDM  Patricia [name] is a 37 y.o. female presenting to the emergency department referred by her primary care physician for hematuria and proteinuria.  Patient is are completed a 24-hour urine for protein which yielded 3270 mg microalbumin, urinalysis today again showed a large amount of protein, relatively inactive sediment - moderate hemoglobin with 0-2 red blood cells.  The differential diagnosis for hematuria includes but is not limited to cystitis, urinary calculi, BPH, renal cell carcinoma, transitional cell carcinoma, glomerulonephritis, polycystic kidney disease, anticoagulant usage, prostate cancer, papillary necrosis (EGD in sig abuse, DM, sickle cell trait or disease), renal infarction, interstitial nephritis, medullary sponge kidney, radiation or chemical cystitis (e.g. Cyclophosphamide), atrophic vaginitis, schistosomiasis, menses, urethritis, urethral diverticula.  With the patient will proteinuria, patient has a likely nephrotic syndrome -  Patient has no family history of lupus or kidney disease that she knows of. Patient is only aware of her mother's side past medical history.  Discussed the patient's case with Dr.  Florene Glen, he would like to an ANA, C3, C4 and ANCA, - I have added tests and discuss this with the patient to followup with [facility]. Patient understands to call Dr. Abel Presto office tomorrow and make an appointment for the next available slot. I also informed the patient that she will likely need a kidney biopsy.  The patient's likely nephrotic syndrome however does not explain the patient's suprapubic pain, there is no evidence of cystitis, I think her blood is likely coming from further up the GU tract. We'll obtain a a CT of the abdomen pelvis -  patient has previously had total hysterectomy.  CT shows likely diverticulitis - this explains the patient's suprapubic slightly left of midline tenderness.  We'll treat the patient with ciprofloxacin and metronidazole as an outpatient, and she will followup with Dr. Florene Glen as dictated.  I explained the diagnosis and have given explicit precautions to return to the ER including any other new or worsening symptoms. The patient understands and accepts the medical plan as it's been dictated and I have answered their questions. Discharge instructions concerning home care and prescriptions have been given.  The patient is STABLE and is discharged to home in good condition.   Rhunette Croft, MD 03/24/12 [phone]

## 2012-03-24 ENCOUNTER — Encounter (HOSPITAL_COMMUNITY): Payer: Self-pay | Admitting: Radiology

## 2012-03-24 ENCOUNTER — Emergency Department (HOSPITAL_COMMUNITY): Payer: Managed Care, Other (non HMO)

## 2012-03-24 LAB — ANCA SCREEN W REFLEX TITER
Atypical p-ANCA Screen: NEGATIVE
c-ANCA Screen: NEGATIVE

## 2012-03-24 LAB — C4 COMPLEMENT: Complement C4, Body Fluid: 38 mg/dL (ref 10–40)

## 2012-03-24 LAB — MPO/PR-3 (ANCA) ANTIBODIES: Myeloperoxidase Abs: <1 AU/mL (ref <20)

## 2012-03-24 LAB — C3 COMPLEMENT: C3 Complement: 153 mg/dL (ref 90–180)

## 2012-03-24 MED ORDER — ONDANSETRON HCL 4 MG PO TABS: 4.0000 mg | ORAL_TABLET | Freq: Three times a day (TID) | ORAL | Status: DC | PRN

## 2012-03-24 MED ORDER — HYDROCODONE-ACETAMINOPHEN 5-325 MG PO TABS: 1.0000 | ORAL_TABLET | Freq: Four times a day (QID) | ORAL | Status: DC | PRN

## 2012-03-24 MED ORDER — POTASSIUM CHLORIDE CRYS ER 20 MEQ PO TBCR
40.0000 meq | EXTENDED_RELEASE_TABLET | Freq: Once | ORAL | Status: DC
  Filled 2012-03-24: qty 2

## 2012-03-24 MED ORDER — DIPHENHYDRAMINE HCL 25 MG PO CAPS
25.0000 mg | ORAL_CAPSULE | Freq: Once | ORAL | Status: DC
  Filled 2012-03-24: qty 1

## 2012-03-24 MED ORDER — METRONIDAZOLE 500 MG PO TABS: 500.0000 mg | ORAL_TABLET | Freq: Two times a day (BID) | ORAL | Status: DC

## 2012-03-24 MED ORDER — METRONIDAZOLE 500 MG PO TABS
500.0000 mg | ORAL_TABLET | Freq: Once | ORAL | Status: AC
  Administered 2012-03-24: 500 mg via ORAL
  Filled 2012-03-24: qty 1

## 2012-03-24 MED ORDER — CIPROFLOXACIN HCL 500 MG PO TABS: 500.0000 mg | ORAL_TABLET | Freq: Two times a day (BID) | ORAL | Status: DC

## 2012-03-24 MED ORDER — HYDROMORPHONE HCL PF 1 MG/ML IJ SOLN
0.5000 mg | Freq: Once | INTRAMUSCULAR | Status: AC
  Administered 2012-03-24: 0.5 mg via INTRAVENOUS
  Filled 2012-03-24: qty 1

## 2012-03-24 MED ORDER — CIPROFLOXACIN HCL 500 MG PO TABS
500.0000 mg | ORAL_TABLET | Freq: Once | ORAL | Status: AC
  Administered 2012-03-24: 500 mg via ORAL
  Filled 2012-03-24: qty 1

## 2012-03-24 MED ORDER — ONDANSETRON 4 MG PO TBDP: 4.0000 mg | ORAL_TABLET | Freq: Once | ORAL | Status: AC

## 2012-03-24 MED ORDER — ONDANSETRON 4 MG PO TBDP
ORAL_TABLET | ORAL | Status: AC
  Administered 2012-03-24: 4 mg via ORAL
  Filled 2012-03-24: qty 1

## 2012-04-19 ENCOUNTER — Other Ambulatory Visit (HOSPITAL_COMMUNITY): Payer: Self-pay | Admitting: Nephrology

## 2012-04-19 DIAGNOSIS — E119 Type 2 diabetes mellitus without complications: Secondary | ICD-10-CM

## 2012-04-19 DIAGNOSIS — N049 Nephrotic syndrome with unspecified morphologic changes: Secondary | ICD-10-CM

## 2012-04-20 ENCOUNTER — Other Ambulatory Visit: Payer: Self-pay | Admitting: Radiology

## 2012-04-25 ENCOUNTER — Encounter: Payer: Self-pay | Admitting: Emergency Medicine

## 2012-04-25 ENCOUNTER — Encounter (HOSPITAL_COMMUNITY): Payer: Self-pay | Admitting: *Deleted

## 2012-04-25 ENCOUNTER — Emergency Department (HOSPITAL_COMMUNITY): Payer: Managed Care, Other (non HMO)

## 2012-04-25 ENCOUNTER — Emergency Department (HOSPITAL_COMMUNITY)
Admission: EM | Admit: 2012-04-25 | Discharge: 2012-04-25 | Disposition: A | Discharge status: Home / Self Care | Payer: Managed Care, Other (non HMO) | Attending: Emergency Medicine | Admitting: Emergency Medicine

## 2012-04-25 DIAGNOSIS — K219 Gastro-esophageal reflux disease without esophagitis: Secondary | ICD-10-CM | POA: Insufficient documentation

## 2012-04-25 DIAGNOSIS — E7800 Pure hypercholesterolemia, unspecified: Secondary | ICD-10-CM | POA: Insufficient documentation

## 2012-04-25 DIAGNOSIS — R0602 Shortness of breath: Secondary | ICD-10-CM | POA: Insufficient documentation

## 2012-04-25 DIAGNOSIS — M7989 Other specified soft tissue disorders: Secondary | ICD-10-CM | POA: Insufficient documentation

## 2012-04-25 DIAGNOSIS — R0989 Other specified symptoms and signs involving the circulatory and respiratory systems: Secondary | ICD-10-CM | POA: Insufficient documentation

## 2012-04-25 DIAGNOSIS — R0789 Other chest pain: Secondary | ICD-10-CM | POA: Insufficient documentation

## 2012-04-25 DIAGNOSIS — N289 Disorder of kidney and ureter, unspecified: Secondary | ICD-10-CM | POA: Insufficient documentation

## 2012-04-25 DIAGNOSIS — R5381 Other malaise: Secondary | ICD-10-CM | POA: Insufficient documentation

## 2012-04-25 DIAGNOSIS — Z79899 Other long term (current) drug therapy: Secondary | ICD-10-CM | POA: Insufficient documentation

## 2012-04-25 DIAGNOSIS — R079 Chest pain, unspecified: Secondary | ICD-10-CM

## 2012-04-25 DIAGNOSIS — E119 Type 2 diabetes mellitus without complications: Secondary | ICD-10-CM | POA: Insufficient documentation

## 2012-04-25 DIAGNOSIS — I10 Essential (primary) hypertension: Secondary | ICD-10-CM | POA: Insufficient documentation

## 2012-04-25 LAB — CBC WITH DIFFERENTIAL/PLATELET
Basophils Absolute: 0.1 10*3/uL (ref 0.0–0.1)
Basophils Relative: 1 % (ref 0–1)
Eosinophils Absolute: 0.1 10*3/uL (ref 0.0–0.7)
Eosinophils Relative: 2 % (ref 0–5)
HCT: 31.8 % — ABNORMAL LOW (ref 36.0–46.0)
Hemoglobin: 10.9 g/dL — ABNORMAL LOW (ref 12.0–15.0)
Lymphocytes Relative: 55 % — ABNORMAL HIGH (ref 12–46)
Lymphs Abs: 2.6 10*3/uL (ref 0.7–4.0)
MCH: 25.3 pg — ABNORMAL LOW (ref 26.0–34.0)
MCHC: 34.3 g/dL (ref 30.0–36.0)
MCV: 73.8 fL — ABNORMAL LOW (ref 78.0–100.0)
Monocytes Absolute: 0.4 10*3/uL (ref 0.1–1.0)
Monocytes Relative: 7 % (ref 3–12)
Neutro Abs: 1.8 10*3/uL (ref 1.7–7.7)
Neutrophils Relative %: 35 % — ABNORMAL LOW (ref 43–77)
Platelets: 292 10*3/uL (ref 150–400)
RBC: 4.31 MIL/uL (ref 3.87–5.11)
RDW: 13.7 % (ref 11.5–15.5)
WBC: 5 10*3/uL (ref 4.0–10.5)

## 2012-04-25 LAB — POCT I-STAT TROPONIN I: Troponin i, poc: 0 ng/mL (ref 0.00–0.08)

## 2012-04-25 LAB — PRO B NATRIURETIC PEPTIDE: Pro B Natriuretic peptide (BNP): 269.3 pg/mL — ABNORMAL HIGH (ref 0–125)

## 2012-04-25 LAB — COMPREHENSIVE METABOLIC PANEL
ALT: 14 U/L (ref 0–35)
AST: 19 U/L (ref 0–37)
Albumin: 1.8 g/dL — ABNORMAL LOW (ref 3.5–5.2)
Alkaline Phosphatase: 34 U/L — ABNORMAL LOW (ref 39–117)
BUN: 13 mg/dL (ref 6–23)
CO2: 26 mEq/L (ref 19–32)
Calcium: 8.5 mg/dL (ref 8.4–10.5)
Chloride: 103 mEq/L (ref 96–112)
Creatinine, Ser: 0.64 mg/dL (ref 0.50–1.10)
GFR calc Af Amer: >90 mL/min (ref >90)
GFR calc non Af Amer: >90 mL/min (ref >90)
Glucose, Bld: 99 mg/dL (ref 70–99)
Potassium: 3.8 mEq/L (ref 3.5–5.1)
Sodium: 137 mEq/L (ref 135–145)
Total Bilirubin: 0.1 mg/dL — ABNORMAL LOW (ref 0.3–1.2)
Total Protein: 5.3 g/dL — ABNORMAL LOW (ref 6.0–8.3)

## 2012-04-25 LAB — GLUCOSE, CAPILLARY: Glucose-Capillary: 98 mg/dL (ref 70–99)

## 2012-04-25 MED ORDER — MORPHINE SULFATE 2 MG/ML IJ SOLN: 2.0000 mg | Freq: Once | INTRAMUSCULAR | Status: DC

## 2012-04-25 MED ORDER — MORPHINE SULFATE 4 MG/ML IJ SOLN
4.0000 mg | Freq: Once | INTRAMUSCULAR | Status: AC
  Administered 2012-04-25: 4 mg via INTRAVENOUS
  Filled 2012-04-25: qty 1

## 2012-04-25 MED ORDER — FUROSEMIDE 10 MG/ML IJ SOLN
40.0000 mg | Freq: Once | INTRAMUSCULAR | Status: AC
  Administered 2012-04-25: 40 mg via INTRAVENOUS
  Filled 2012-04-25: qty 4

## 2012-04-25 MED ORDER — ALBUTEROL SULFATE (5 MG/ML) 0.5% IN NEBU
5.0000 mg | INHALATION_SOLUTION | Freq: Once | RESPIRATORY_TRACT | Status: AC
  Administered 2012-04-25: 5 mg via RESPIRATORY_TRACT
  Filled 2012-04-25 (×2): qty 0.5

## 2012-04-25 MED ORDER — GI COCKTAIL ~~LOC~~
30.0000 mL | Freq: Once | ORAL | Status: AC
  Administered 2012-04-25: 30 mL via ORAL
  Filled 2012-04-25: qty 30

## 2012-04-25 MED ORDER — ALBUTEROL SULFATE HFA 108 (90 BASE) MCG/ACT IN AERS
2.0000 | INHALATION_SPRAY | RESPIRATORY_TRACT | Status: DC | PRN
  Administered 2012-04-25: 2 via RESPIRATORY_TRACT
  Filled 2012-04-25: qty 6.7

## 2012-04-25 MED ORDER — MORPHINE SULFATE 2 MG/ML IJ SOLN
2.0000 mg | Freq: Once | INTRAMUSCULAR | Status: AC
  Administered 2012-04-25: 2 mg via INTRAVENOUS
  Filled 2012-04-25: qty 1

## 2012-04-25 NOTE — ED Notes (Signed)
AIDET performed. 

## 2012-04-25 NOTE — ED Provider Notes (Signed)
Medical screening examination/treatment/procedure(s) were conducted as a shared visit with non-physician practitioner(s) and myself.  I personally evaluated the patient during the encounter  The patient is well-appearing tibia.  Her chest pressure is been constant since earlier this morning.  Her initial EKG and troponin are normal.  She will lower see the serial troponin.  Her d-dimer is normal.  Her chest x-ray is clear.  Are not sure what the etiology of her dyspnea is blood do not believe this to be ACS or pulmonary embolism.  I don't think we are missing anything severe today I think she'll need to followup closely with her primary care physician.    Dg Chest 2 View  04/25/2012  *RADIOLOGY REPORT*  Clinical Data: Chest pain, shortness of breath, chest heaviness, hypertension, diabetes  CHEST - 2 VIEW  Comparison: 02/28/2012  Findings: Upper-normal size of cardiac silhouette. Mediastinal contours and pulmonary vascularity normal. Lungs clear. No pleural effusion or pneumothorax. Bones unremarkable.  IMPRESSION: No acute abnormalities.   Original Report Authenticated By: Lavonia Dana, M.D.   I personally reviewed the imaging tests through PACS system I reviewed available ER/hospitalization records through the [facility], MD 04/25/12 [phone]

## 2012-04-25 NOTE — ED Notes (Signed)
Pt was on her way to get Lasix and had decreased lasix dose.  Pt states developed lightheaded, sob, and chest discomfort

## 2012-04-25 NOTE — ED Provider Notes (Addendum)
Pt's second troponin neg and pt d/ced home.  Pt improved after albuterol.  Blanchie Dessert, MD 04/25/12 [place], MD 04/25/12 [phone]

## 2012-04-25 NOTE — ED Notes (Signed)
Pt ambulated with O2 sats around 98%.

## 2012-04-25 NOTE — ED Provider Notes (Signed)
History     CSN: [id number]  Arrival date & time 04/25/12  1233   First MD Initiated Contact with Patient 04/25/12 1333      Chief Complaint  Patient presents with  . Chest Pain  . Shortness of Breath    (Consider location/radiation/quality/duration/timing/severity/associated sxs/prior treatment) HPI Comments: 37 year old female the past medical history of hypertension, diabetes and nephrotic syndrome presents to the emergency department complaining of chest pressure and shortness of breath x1 day beginning earlier this morning before going to work. 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 States over the weekend she only had 3 Lasix pills left, and instead of taking her normal dosage of $RemoveB'180mg'rOmdNXBk$ , she split them up so she would not run out until today when she could get a refill. Over the weekend her legs became more swollen, she elevated them which helped a little. Admits to chest pressure and discomfort and difficulty catching her breath completely. Denies nausea, vomiting, diaphoresis, headache, dizziness. Nephrologist Dr. Florene Glen.  Patient is a 37 y.o. female presenting with chest pain and shortness of breath. The history is provided by the patient.  Chest Pain Associated symptoms: shortness of breath   Associated symptoms: no back pain, no diaphoresis, no dizziness, no fever, no headache, no nausea and not vomiting   Shortness of Breath Associated symptoms: chest pain   Associated symptoms: no diaphoresis, no fever, no headaches and no vomiting     Past Medical History  Diagnosis Date  . Hypertension   . Diabetes mellitus   . Acid reflux   . High cholesterol   . Renal disorder     kidney issues    Past Surgical History  Procedure Laterality Date  . Abdominal hysterectomy      Family History  Problem Relation Age of Onset  . Hypertension Mother   . Thyroid disease Mother   . Coronary artery disease Father   .  Hypertension Father   . Diabetes Father     History  Substance Use Topics  . Smoking status: Never Smoker   . Smokeless tobacco: Not on file  . Alcohol Use: No    OB History   Grav Para Term Preterm Abortions TAB SAB Ect Mult Living                  Review of Systems  Constitutional: Negative for fever, chills and diaphoresis.  Respiratory: Positive for shortness of breath.   Cardiovascular: Positive for chest pain and leg swelling.  Gastrointestinal: Negative for nausea and vomiting.  Genitourinary: Negative for dysuria, decreased urine volume and difficulty urinating.  Musculoskeletal: Negative for back pain.  Neurological: Negative for dizziness, light-headedness and headaches.  All other systems reviewed and are negative.    Allergies  Review of patient's allergies indicates no known allergies.  Home Medications   Current Outpatient Rx  Name  Route  Sig  Dispense  Refill  . ciprofloxacin (CIPRO) 500 MG tablet   Oral   Take 1 tablet (500 mg total) by mouth every 12 (twelve) hours.   14 tablet   0   . EQL POTASSIUM GLUCONATE PO   Oral   Take 1 tablet by mouth daily.         . hydrochlorothiazide (HYDRODIURIL) 25 MG tablet   Oral   Take 25 mg by mouth daily.         Marland Kitchen HYDROcodone-acetaminophen (NORCO/VICODIN) 5-325 MG per tablet   Oral  Take 1-2 tablets by mouth every 6 (six) hours as needed for pain.   17 tablet   0   . lisinopril (PRINIVIL,ZESTRIL) 40 MG tablet   Oral   Take 40 mg by mouth daily.           . metFORMIN (GLUCOPHAGE) 500 MG tablet   Oral   Take 500 mg by mouth daily with breakfast.           . metroNIDAZOLE (FLAGYL) 500 MG tablet   Oral   Take 1 tablet (500 mg total) by mouth 2 (two) times daily.   14 tablet   0   . omeprazole (PRILOSEC) 40 MG capsule   Oral   Take 40 mg by mouth daily.           . ondansetron (ZOFRAN) 4 MG tablet   Oral   Take 1 tablet (4 mg total) by mouth every 8 (eight) hours as needed for  nausea.   12 tablet   0   . simvastatin (ZOCOR) 10 MG tablet   Oral   Take 10 mg by mouth at bedtime.           BP 119/62  Pulse 62  Temp(Src) 98.6 F (37 C) (Oral)  Resp 10  SpO2 99%  Physical Exam  Nursing note and vitals reviewed. Constitutional: She is oriented to person, place, and time. She appears well-developed. No distress.  Obese  HENT:  Head: Normocephalic and atraumatic.  Mouth/Throat: Oropharynx is clear and moist.  Eyes: Conjunctivae and EOM are normal. Pupils are equal, round, and reactive to light.  Neck: Normal range of motion. Neck supple. No JVD present.  Cardiovascular: Normal rate, regular rhythm, normal heart sounds and intact distal pulses.   +2 pitting edeme LE bilateral  Pulmonary/Chest: Effort normal. No respiratory distress. She has rales (slight posterior lower lung fields bilateral).  Abdominal: Soft. Bowel sounds are normal. She exhibits no distension. There is no tenderness.  Musculoskeletal: Normal range of motion.  Neurological: She is alert and oriented to person, place, and time.  Skin: Skin is warm and dry. She is not diaphoretic. No erythema.  Psychiatric: She has a normal mood and affect. Her behavior is normal.    ED Course  Procedures (including critical care time)   Date: 04/25/2012  Rate: 68  Rhythm: normal sinus rhythm  QRS Axis: normal  Intervals: normal  ST/T Wave abnormalities: normal  Conduction Disutrbances:none  Narrative Interpretation: NSR, no stemi  Old EKG Reviewed: unchanged   Labs Reviewed  CBC WITH DIFFERENTIAL - Abnormal; Notable for the following:    Hemoglobin 10.9 (*)    HCT 31.8 (*)    MCV 73.8 (*)    MCH 25.3 (*)    Neutrophils Relative 35 (*)    Lymphocytes Relative 55 (*)    All other components within normal limits  COMPREHENSIVE METABOLIC PANEL - Abnormal; Notable for the following:    Total Protein 5.3 (*)    Albumin 1.8 (*)    Alkaline Phosphatase 34 (*)    Total Bilirubin 0.1 (*)     All other components within normal limits  PRO B NATRIURETIC PEPTIDE - Abnormal; Notable for the following:    Pro B Natriuretic peptide (BNP) 269.3 (*)    All other components within normal limits  GLUCOSE, CAPILLARY  POCT I-STAT TROPONIN I   Dg Chest 2 View  04/25/2012  *RADIOLOGY REPORT*  Clinical Data: Chest pain, shortness of breath, chest heaviness, hypertension, diabetes  CHEST -  2 VIEW  Comparison: 02/28/2012  Findings: Upper-normal size of cardiac silhouette. Mediastinal contours and pulmonary vascularity normal. Lungs clear. No pleural effusion or pneumothorax. Bones unremarkable.  IMPRESSION: No acute abnormalities.   Original Report Authenticated By: Lavonia Dana, M.D.      No diagnosis found.    MDM  Chest pain/sob- troponin negative. BNP 269.3 without any old values to compare. +2 pitting edema on exam and slight rales. CXR unremarkable. O2 sat 98-100% on RA. She ran out of lasix over the weekend, has a refill at the pharmacy now. $RemoveBefor'40mg'yQEHPqjbjWTs$  IV lasix given along with morphine for chest pressure. After receiving morphine, patient still reports chest heaviness/pressure. Will give more morphine and re-assess. She has not yet urinated from lasix, however has the urge to at this time.  3:23 PM Patient urinated, still experiencing chest pressure. O2 sat while walking around the ED 98%, felt discomfort at that time. No SOB. Hx of GERD- giving GI cocktail. Second troponin at 1645. Case discussed with Dr. Venora Maples who agrees with plan of care.  4:05 PM No change with GI cocktail or morphine. Patient now reporting SOB with the heaviness. Obtaining d-dimer. Care will be assumed at shift change by Dr. Maryan Rued.  Illene Labrador, PA-C 04/25/12 1608

## 2012-04-27 ENCOUNTER — Other Ambulatory Visit: Payer: Self-pay | Admitting: Radiology

## 2012-04-28 ENCOUNTER — Encounter (HOSPITAL_COMMUNITY): Payer: Self-pay

## 2012-04-28 ENCOUNTER — Other Ambulatory Visit (HOSPITAL_COMMUNITY): Payer: Self-pay | Admitting: Interventional Radiology

## 2012-04-28 ENCOUNTER — Ambulatory Visit (HOSPITAL_COMMUNITY)
Admission: RE | Admit: 2012-04-28 | Discharge: 2012-04-28 | Disposition: A | Discharge status: Home / Self Care | Payer: Managed Care, Other (non HMO) | Source: Ambulatory Visit | Attending: Interventional Radiology | Admitting: Interventional Radiology

## 2012-04-28 ENCOUNTER — Ambulatory Visit (HOSPITAL_COMMUNITY)
Admission: RE | Admit: 2012-04-28 | Discharge: 2012-04-28 | Disposition: A | Discharge status: Home / Self Care | Payer: Managed Care, Other (non HMO) | Source: Ambulatory Visit | Attending: Nephrology | Admitting: Nephrology

## 2012-04-28 DIAGNOSIS — Z01818 Encounter for other preprocedural examination: Secondary | ICD-10-CM

## 2012-04-28 DIAGNOSIS — R059 Cough, unspecified: Secondary | ICD-10-CM | POA: Insufficient documentation

## 2012-04-28 DIAGNOSIS — E119 Type 2 diabetes mellitus without complications: Secondary | ICD-10-CM | POA: Insufficient documentation

## 2012-04-28 DIAGNOSIS — Z01810 Encounter for preprocedural cardiovascular examination: Secondary | ICD-10-CM | POA: Insufficient documentation

## 2012-04-28 DIAGNOSIS — I10 Essential (primary) hypertension: Secondary | ICD-10-CM | POA: Insufficient documentation

## 2012-04-28 DIAGNOSIS — I517 Cardiomegaly: Secondary | ICD-10-CM | POA: Insufficient documentation

## 2012-04-28 DIAGNOSIS — R0789 Other chest pain: Secondary | ICD-10-CM | POA: Insufficient documentation

## 2012-04-28 DIAGNOSIS — K219 Gastro-esophageal reflux disease without esophagitis: Secondary | ICD-10-CM | POA: Insufficient documentation

## 2012-04-28 DIAGNOSIS — N049 Nephrotic syndrome with unspecified morphologic changes: Secondary | ICD-10-CM

## 2012-04-28 DIAGNOSIS — R05 Cough: Secondary | ICD-10-CM | POA: Insufficient documentation

## 2012-04-28 DIAGNOSIS — R809 Proteinuria, unspecified: Secondary | ICD-10-CM | POA: Insufficient documentation

## 2012-04-28 DIAGNOSIS — Z01812 Encounter for preprocedural laboratory examination: Secondary | ICD-10-CM | POA: Insufficient documentation

## 2012-04-28 DIAGNOSIS — E7800 Pure hypercholesterolemia, unspecified: Secondary | ICD-10-CM | POA: Insufficient documentation

## 2012-04-28 LAB — CBC
Hemoglobin: 10.5 g/dL — ABNORMAL LOW (ref 12.0–15.0)
MCH: 24.8 pg — ABNORMAL LOW (ref 26.0–34.0)
MCHC: 33.9 g/dL (ref 30.0–36.0)
MCV: 73.1 fL — ABNORMAL LOW (ref 78.0–100.0)

## 2012-04-28 LAB — PROTIME-INR: Prothrombin Time: 12.4 seconds (ref 11.6–15.2)

## 2012-04-28 MED ORDER — DIPHENHYDRAMINE HCL 50 MG/ML IJ SOLN
INTRAMUSCULAR | Status: AC
  Filled 2012-04-28: qty 1

## 2012-04-28 MED ORDER — MIDAZOLAM HCL 2 MG/2ML IJ SOLN
INTRAMUSCULAR | Status: AC | PRN
  Administered 2012-04-28 (×2): 1 mg via INTRAVENOUS

## 2012-04-28 MED ORDER — GI COCKTAIL ~~LOC~~
30.0000 mL | Freq: Once | ORAL | Status: DC
  Filled 2012-04-28: qty 30

## 2012-04-28 MED ORDER — GI COCKTAIL ~~LOC~~
30.0000 mL | Freq: Once | ORAL | Status: AC
  Administered 2012-04-28: 30 mL via ORAL

## 2012-04-28 MED ORDER — DIPHENHYDRAMINE HCL 25 MG PO CAPS
ORAL_CAPSULE | ORAL | Status: AC
  Administered 2012-04-28: 25 mg
  Filled 2012-04-28: qty 1

## 2012-04-28 MED ORDER — DIPHENHYDRAMINE HCL 25 MG PO CAPS
25.0000 mg | ORAL_CAPSULE | Freq: Once | ORAL | Status: AC
  Filled 2012-04-28: qty 1

## 2012-04-28 MED ORDER — FENTANYL CITRATE 0.05 MG/ML IJ SOLN
INTRAMUSCULAR | Status: AC | PRN
  Administered 2012-04-28 (×2): 50 ug via INTRAVENOUS

## 2012-04-28 MED ORDER — ALBUTEROL SULFATE (5 MG/ML) 0.5% IN NEBU
INHALATION_SOLUTION | RESPIRATORY_TRACT | Status: AC
  Filled 2012-04-28: qty 0.5

## 2012-04-28 MED ORDER — ALBUTEROL SULFATE (5 MG/ML) 0.5% IN NEBU
2.5000 mg | INHALATION_SOLUTION | Freq: Once | RESPIRATORY_TRACT | Status: AC
  Administered 2012-04-28: 2.5 mg via RESPIRATORY_TRACT
  Filled 2012-04-28: qty 0.5

## 2012-04-28 MED ORDER — SODIUM CHLORIDE 0.9 % IV SOLN
Freq: Once | INTRAVENOUS | Status: AC
  Administered 2012-04-28: 09:00:00 via INTRAVENOUS

## 2012-04-28 MED ORDER — MIDAZOLAM HCL 2 MG/2ML IJ SOLN
INTRAMUSCULAR | Status: AC
  Filled 2012-04-28: qty 4

## 2012-04-28 MED ORDER — FENTANYL CITRATE 0.05 MG/ML IJ SOLN
INTRAMUSCULAR | Status: AC
  Filled 2012-04-28: qty 4

## 2012-04-28 MED ORDER — DIPHENHYDRAMINE HCL 50 MG/ML IJ SOLN
25.0000 mg | Freq: Once | INTRAMUSCULAR | Status: AC
  Administered 2012-04-28: 25 mg via INTRAVENOUS
  Filled 2012-04-28: qty 0.5

## 2012-04-28 NOTE — H&P (Signed)
Agree with PA note.    Signed,  Heath K. McCullough, MD Vascular & Interventional Radiologist [facility]  

## 2012-04-28 NOTE — H&P (Signed)
Chief Complaint: "I'm here for a biopsy" Referring Physician:Powell HPI: Patricia [name] is an 37 y.o. female with recently diagnosed proteinuria and nephrotic syndrome. SHe is referred for US guided random renal biopsy. PMHx and meds reviewed. Pt was just here in ER a few days ago for 'chest pressure'. Workup was negative and symptoms have since resolved.   Past Medical History:  Past Medical History  Diagnosis Date  . Hypertension   . Diabetes mellitus   . Acid reflux   . High cholesterol   . Renal disorder     kidney issues    Past Surgical History:  Past Surgical History  Procedure Laterality Date  . Abdominal hysterectomy      Family History:  Family History  Problem Relation Age of Onset  . Hypertension Mother   . Thyroid disease Mother   . Coronary artery disease Father   . Hypertension Father   . Diabetes Father     Social History:  reports that she has never smoked. She does not have any smokeless tobacco history on file. She reports that she does not drink alcohol or use illicit drugs.  Allergies: No Known Allergies  Medications:. EQL POTASSIUM GLUCONATE PO  Take 1 tablet by mouth daily.  . hydrochlorothiazide (HYDRODIURIL) 25 MG tablet  Take 25 mg by mouth daily.  Marland Kitchen HYDROcodone-acetaminophen (NORCO/VICODIN) 5-325  Take 1-2 tablets by mouth every 6 (six) hours as needed for pain.  Marland Kitchen lisinopril (PRINIVIL,ZESTRIL) 40 MG tablet  Take 40 mg by mouth daily.  . metFORMIN (GLUCOPHAGE) 500 MG tablet  Take 500 mg by mouth daily with breakfast. . metroNIDAZOLE (FLAGYL) 500 MG tablet  Take 1 tablet (500 mg total) by mouth 2 (two) times daily.  14 tablet  . omeprazole (PRILOSEC) 40 MG capsule  Take 40 mg by mouth daily. . ondansetron (ZOFRAN) 4 MG tablet  Take 1 tablet (4 mg total) by mouth every 8 (eight) hours as needed for nausea.  . simvastatin (ZOCOR) 10 MG tablet  Take 10 mg by mouth at bedtime.    Please HPI for pertinent positives, otherwise complete  10 system ROS negative.  Physical Exam: Blood pressure 131/75, pulse 72, temperature 98.2 F (36.8 C), temperature source Oral, resp. rate 18, height 5\' 8"  (1.727 m), weight 266 lb (120.657 kg), SpO2 100.00%. Body mass index is 40.45 kg/(m^2).   General Appearance:  Alert, cooperative, no distress, appears stated age  Head:  Normocephalic, without obvious abnormality, atraumatic  ENT: Unremarkable  Neck: Supple, symmetrical, trachea midline,   Lungs:   Clear to auscultation bilaterally, no w/r/r, respirations unlabored without use of accessory muscles.  Chest Wall:  No tenderness or deformity  Heart:  Regular rate and rhythm, S1, S2 normal, no murmur, rub or gallop.   Abdomen:   Soft, non-tender, non distended. Bowel sounds active all four quadrants,  no masses, no organomegaly.  Neurologic: Normal affect, no gross deficits.   Results for orders placed during the hospital encounter of 04/28/12 (from the past 48 hour(s))  APTT     Status: None   Collection Time    04/28/12  8:41 AM      Result Value Range   aPTT 32  24 - 37 seconds  CBC     Status: Abnormal   Collection Time    04/28/12  8:41 AM      Result Value Range   WBC 4.7  4.0 - 10.5 K/uL   RBC 4.24  3.87 - 5.11 MIL/uL   Hemoglobin  10.5 (*) 12.0 - 15.0 g/dL   HCT 31.0 (*) 36.0 - 46.0 %   MCV 73.1 (*) 78.0 - 100.0 fL   MCH 24.8 (*) 26.0 - 34.0 pg   MCHC 33.9  30.0 - 36.0 g/dL   RDW 13.9  11.5 - 15.5 %   Platelets 261  150 - 400 K/uL  PROTIME-INR     Status: None   Collection Time    04/28/12  8:41 AM      Result Value Range   Prothrombin Time 12.4  11.6 - 15.2 seconds   INR 0.93  0.00 - 1.49   No results found.  Assessment/Plan Proteinuria, nephrotic syndrome, DM For US guided random renal biopsy. Discussed procedure, risks, complications, recovery, and use of sedation. Labs reviewed, ok Consent signed in chart  Ascencion Dike PA-C 04/28/2012, 9:25 AM

## 2012-04-28 NOTE — ED Notes (Signed)
Patient continues to c/o chest pressure 6/10. Dr. Geroge Baseman notified

## 2012-04-28 NOTE — ED Notes (Signed)
Requested a ss bed for recovery

## 2012-04-28 NOTE — ED Notes (Signed)
Patient continues to c/o chest tightness 7/10.  Dr. Geroge Baseman notified and order received to administer albuterol neb.  Respirations are non labored, however patient continues to clear throat and has a cough.

## 2012-04-28 NOTE — Procedures (Signed)
Interventional Radiology Procedure Note  Procedure: US guided random core biopsy of left lower pole renal cortex Complications: Bard biopsy device did not deploy well, had to switch to the Biopince device.  Pt had unexplained coughing fits during the procedure.  Recommendations: - Bedrest x 4 hours - Sips and chips only first 2 hours - ADAT after 2 hrs if pt doing well - Pathology pending  Signed,  Criselda Peaches, MD Vascular & Interventional Radiologist [facility]

## 2012-04-28 NOTE — ED Notes (Signed)
Patient is resting comfortably. 

## 2012-04-28 NOTE — ED Notes (Signed)
Patient began couging during procedure.  After procedure patient continues to cough and c/o tightness in her chest. R-20, non-labored.  Chest clear.

## 2012-04-28 NOTE — ED Notes (Signed)
A copy of discharge instructions given to pt

## 2012-04-28 NOTE — ED Notes (Signed)
Patient begins to itch.  Dr. Geroge Baseman notified and orders received for benadryl.

## 2012-05-17 ENCOUNTER — Ambulatory Visit
Admission: RE | Admit: 2012-05-17 | Discharge: 2012-05-17 | Disposition: A | Discharge status: Home / Self Care | Payer: Managed Care, Other (non HMO) | Source: Ambulatory Visit | Attending: Nephrology | Admitting: Nephrology

## 2012-05-17 ENCOUNTER — Other Ambulatory Visit: Payer: Self-pay | Admitting: Nephrology

## 2012-05-17 DIAGNOSIS — R109 Unspecified abdominal pain: Secondary | ICD-10-CM

## 2012-05-22 ENCOUNTER — Emergency Department (HOSPITAL_COMMUNITY): Payer: Managed Care, Other (non HMO)

## 2012-05-22 ENCOUNTER — Emergency Department (HOSPITAL_COMMUNITY)
Admission: EM | Admit: 2012-05-22 | Discharge: 2012-05-23 | Disposition: A | Discharge status: Home / Self Care | Payer: Managed Care, Other (non HMO) | Attending: Emergency Medicine | Admitting: Emergency Medicine

## 2012-05-22 DIAGNOSIS — K219 Gastro-esophageal reflux disease without esophagitis: Secondary | ICD-10-CM | POA: Insufficient documentation

## 2012-05-22 DIAGNOSIS — M7989 Other specified soft tissue disorders: Secondary | ICD-10-CM | POA: Insufficient documentation

## 2012-05-22 DIAGNOSIS — E7800 Pure hypercholesterolemia, unspecified: Secondary | ICD-10-CM | POA: Insufficient documentation

## 2012-05-22 DIAGNOSIS — I10 Essential (primary) hypertension: Secondary | ICD-10-CM | POA: Insufficient documentation

## 2012-05-22 DIAGNOSIS — R252 Cramp and spasm: Secondary | ICD-10-CM

## 2012-05-22 DIAGNOSIS — N049 Nephrotic syndrome with unspecified morphologic changes: Secondary | ICD-10-CM | POA: Insufficient documentation

## 2012-05-22 DIAGNOSIS — M79609 Pain in unspecified limb: Secondary | ICD-10-CM | POA: Insufficient documentation

## 2012-05-22 DIAGNOSIS — E119 Type 2 diabetes mellitus without complications: Secondary | ICD-10-CM | POA: Insufficient documentation

## 2012-05-22 DIAGNOSIS — Z79899 Other long term (current) drug therapy: Secondary | ICD-10-CM | POA: Insufficient documentation

## 2012-05-22 LAB — CBC WITH DIFFERENTIAL/PLATELET
Eosinophils Absolute: 0.1 10*3/uL (ref 0.0–0.7)
Lymphs Abs: 3.5 10*3/uL (ref 0.7–4.0)
MCH: 25.1 pg — ABNORMAL LOW (ref 26.0–34.0)
Neutrophils Relative %: 40 % — ABNORMAL LOW (ref 43–77)
Platelets: 336 10*3/uL (ref 150–400)
RBC: 4.15 MIL/uL (ref 3.87–5.11)
WBC: 7 10*3/uL (ref 4.0–10.5)

## 2012-05-22 LAB — POCT I-STAT, CHEM 8
BUN: 26 mg/dL — ABNORMAL HIGH (ref 6–23)
Chloride: 103 mEq/L (ref 96–112)
Creatinine, Ser: 1.1 mg/dL (ref 0.50–1.10)
Glucose, Bld: 85 mg/dL (ref 70–99)
Potassium: 4 mEq/L (ref 3.5–5.1)

## 2012-05-22 LAB — POCT I-STAT TROPONIN I

## 2012-05-22 MED ORDER — FENTANYL CITRATE 0.05 MG/ML IJ SOLN
100.0000 ug | Freq: Once | INTRAMUSCULAR | Status: AC
  Administered 2012-05-23: 100 ug via INTRAVENOUS
  Filled 2012-05-22: qty 2

## 2012-05-22 NOTE — ED Provider Notes (Signed)
History     CSN: [id number]  Arrival date & time 05/22/12  2301   First MD Initiated Contact with Patient 05/22/12 2308      No chief complaint on file.   (Consider location/radiation/quality/duration/timing/severity/associated sxs/prior treatment) Patient is a 37 y.o. female presenting with shortness of breath. The history is provided by the patient.  Shortness of Breath Severity:  Moderate Onset quality:  Gradual Timing:  Constant Progression:  Worsening Chronicity:  Recurrent Context: not URI   Relieved by:  Nothing Exacerbated by: lying flat which also makes chest pressure worse. Ineffective treatments:  Lying down Associated symptoms: chest pain   Associated symptoms: no sputum production   Associated symptoms comment:  Leg and stomach cramps Risk factors: no prolonged immobilization     Past Medical History  Diagnosis Date  . Hypertension   . Diabetes mellitus   . Acid reflux   . High cholesterol   . Renal disorder     kidney issues    Past Surgical History  Procedure Laterality Date  . Abdominal hysterectomy      Family History  Problem Relation Age of Onset  . Hypertension Mother   . Thyroid disease Mother   . Coronary artery disease Father   . Hypertension Father   . Diabetes Father     History  Substance Use Topics  . Smoking status: Never Smoker   . Smokeless tobacco: Not on file  . Alcohol Use: No    OB History   Grav Para Term Preterm Abortions TAB SAB Ect Mult Living                  Review of Systems  Respiratory: Positive for shortness of breath. Negative for sputum production.   Cardiovascular: Positive for chest pain and leg swelling.  All other systems reviewed and are negative.    Allergies  Review of patient's allergies indicates no known allergies.  Home Medications   Current Outpatient Rx  Name  Route  Sig  Dispense  Refill  . cholecalciferol (VITAMIN D) 1000 UNITS tablet   Oral   Take 1,000 Units by mouth  daily.         . furosemide (LASIX) 80 MG tablet   Oral   Take 80 mg by mouth 2 (two) times daily.         Marland Kitchen lisinopril (PRINIVIL,ZESTRIL) 40 MG tablet   Oral   Take 40 mg by mouth daily.           . metFORMIN (GLUCOPHAGE) 500 MG tablet   Oral   Take 500 mg by mouth daily with breakfast.           . naproxen sodium (ANAPROX) 220 MG tablet   Oral   Take 220 mg by mouth 2 (two) times daily as needed (pain).         Marland Kitchen omeprazole (PRILOSEC) 40 MG capsule   Oral   Take 40 mg by mouth daily.           . potassium chloride (K-DUR,KLOR-CON) 10 MEQ tablet   Oral   Take 40 mEq by mouth 2 (two) times daily.         . simvastatin (ZOCOR) 10 MG tablet   Oral   Take 10 mg by mouth at bedtime.           BP 143/90  Pulse 79  Temp(Src) 98.4 F (36.9 C)  SpO2 100%  Physical Exam  Constitutional: She is oriented to person, place,  and time. She appears well-developed and well-nourished.  HENT:  Head: Normocephalic and atraumatic.  Mouth/Throat: Oropharynx is clear and moist.  Eyes: Conjunctivae are normal. Pupils are equal, round, and reactive to light.  Neck: Normal range of motion. Neck supple.  Cardiovascular: Normal rate, regular rhythm and intact distal pulses.   Pulmonary/Chest: She has decreased breath sounds.  Abdominal: Soft. Bowel sounds are normal. There is no tenderness. There is no rebound and no guarding.  Musculoskeletal: Normal range of motion. She exhibits edema.  Neurological: She is alert and oriented to person, place, and time.  Skin: Skin is warm and dry. She is not diaphoretic.  Psychiatric: She has a normal mood and affect.    ED Course  Procedures (including critical care time)  Labs Reviewed  CBC WITH DIFFERENTIAL - Abnormal; Notable for the following:    Hemoglobin 10.4 (*)    HCT 31.2 (*)    MCV 75.2 (*)    MCH 25.1 (*)    Neutrophils Relative % 40 (*)    Lymphocytes Relative 49 (*)    All other components within normal limits   POCT I-STAT, CHEM 8 - Abnormal; Notable for the following:    BUN 26 (*)    Hemoglobin 10.2 (*)    HCT 30.0 (*)    All other components within normal limits  PRO B NATRIURETIC PEPTIDE  MAGNESIUM   No results found.   No diagnosis found.    MDM   Date: 05/22/2012  Rate: 70  Rhythm: normal sinus rhythm  QRS Axis: normal  Intervals: normal  ST/T Wave abnormalities: normal  Conduction Disutrbances: none  Narrative Interpretation: unremarkable         In the setting of ongoing symptoms negative EKG and 2 negative troponins excluded ACS.  Negative DDIMER excluded need for further imaging necessary follow up withe your family doctor for ongoing care.     Carlisle Beers, MD 05/23/12 [phone]

## 2012-05-23 LAB — D-DIMER, QUANTITATIVE: D-Dimer, Quant: 0.42 ug/mL-FEU (ref 0.00–0.48)

## 2012-05-23 LAB — POCT I-STAT TROPONIN I: Troponin i, poc: 0 ng/mL (ref 0.00–0.08)

## 2012-05-23 MED ORDER — FUROSEMIDE 20 MG PO TABS
40.0000 mg | ORAL_TABLET | Freq: Once | ORAL | Status: AC
  Administered 2012-05-23: 40 mg via ORAL
  Filled 2012-05-23: qty 2

## 2012-05-23 MED ORDER — SODIUM CHLORIDE 0.9 % IJ SOLN
10.0000 mL | INTRAMUSCULAR | Status: DC | PRN
  Administered 2012-05-23: 10 mL via INTRAVENOUS

## 2012-05-23 MED ORDER — OXYCODONE-ACETAMINOPHEN 5-325 MG PO TABS: 1.0000 | ORAL_TABLET | Freq: Four times a day (QID) | ORAL | Status: DC | PRN

## 2012-05-23 NOTE — ED Notes (Signed)
Pt c/o cramping bilateral lower extremities

## 2012-05-25 ENCOUNTER — Telehealth: Payer: Self-pay | Admitting: Neurology

## 2012-05-25 NOTE — Telephone Encounter (Signed)
. °  Dr. Erling Cruz is referring Patricia [name], 37 y.o. y/o female, for the evaluation of sleep apnea.  Wt: 266 lbs. Ht: 68 in. BMI: 40.4  Diagnoses: EDS Snoring Morbid Obesity Hypertension Diabetes Hypercholesterolemia  Medication List: Current Outpatient Prescriptions  Medication Sig Dispense Refill   cholecalciferol (VITAMIN D) 1000 UNITS tablet Take 1,000 Units by mouth daily.       furosemide (LASIX) 80 MG tablet Take 80 mg by mouth 2 (two) times daily.       lisinopril (PRINIVIL,ZESTRIL) 40 MG tablet Take 40 mg by mouth daily.         metFORMIN (GLUCOPHAGE) 500 MG tablet Take 500 mg by mouth daily with breakfast.         naproxen sodium (ANAPROX) 220 MG tablet Take 220 mg by mouth 2 (two) times daily as needed (pain).       omeprazole (PRILOSEC) 40 MG capsule Take 40 mg by mouth daily.         oxyCODONE-acetaminophen (PERCOCET) 5-325 MG per tablet Take 1 tablet by mouth every 6 (six) hours as needed for pain.  10 tablet  0   potassium chloride (K-DUR,KLOR-CON) 10 MEQ tablet Take 40 mEq by mouth 2 (two) times daily.       simvastatin (ZOCOR) 10 MG tablet Take 10 mg by mouth at bedtime.       No current facility-administered medications for this visit.    This patient presents to Dr. Erling Cruz primary complaint of excessive daytime sleepiness.  She endorses [place] at 17.  She is not aware of apneic events but does have thunderous snoring.  Dr. Florene Glen would like to rule out OSA.  Insurance:  [employer] - Prior Approval is not required.  Dr. Brett Fairy has reviewed.

## 2012-05-27 ENCOUNTER — Ambulatory Visit (INDEPENDENT_AMBULATORY_CARE_PROVIDER_SITE_OTHER): Payer: Managed Care, Other (non HMO) | Admitting: Neurology

## 2012-05-27 VITALS — BP 142/86 | Ht 68.0 in | Wt 266.0 lb

## 2012-05-27 DIAGNOSIS — G4733 Obstructive sleep apnea (adult) (pediatric): Secondary | ICD-10-CM

## 2012-05-27 DIAGNOSIS — G4737 Central sleep apnea in conditions classified elsewhere: Secondary | ICD-10-CM

## 2012-06-16 ENCOUNTER — Encounter: Payer: Self-pay | Admitting: *Deleted

## 2012-06-16 ENCOUNTER — Other Ambulatory Visit: Payer: Self-pay | Admitting: *Deleted

## 2012-06-16 ENCOUNTER — Telehealth: Payer: Self-pay | Admitting: *Deleted

## 2012-06-16 DIAGNOSIS — G4737 Central sleep apnea in conditions classified elsewhere: Secondary | ICD-10-CM

## 2012-06-16 DIAGNOSIS — G4733 Obstructive sleep apnea (adult) (pediatric): Secondary | ICD-10-CM

## 2012-06-16 NOTE — Telephone Encounter (Signed)
Spk to Patricia [name], went over results in detail, she understood well.  Explained various treatment options for her including PAP therapy, ENT consultation, dental device, weight loss, and positional therapy.  Offered an appt to meet with Dr. Brett Fairy to discuss results and options if she desires.  She plan to visit with Dr. Florene Glen to help make a determination and will get back to [place] if she requires a referral or a titration.   Copy of results mailed to her home.

## 2012-06-16 NOTE — Telephone Encounter (Signed)
Left message for patient regarding sleep study results, asked patient to call me back to discuss results and have questions answered.  Explained that a copy of the sleep study was sent to referring physician and copy of study is coming to them in the mail.  

## 2012-06-17 NOTE — Progress Notes (Signed)
Quick Note:  This patient polysomnography report was issued on 06-05-12. Raegan Sipp, MD  ______

## 2012-06-17 NOTE — Progress Notes (Signed)
Quick Note:  Sleep study did not give evidence of Apnea. ______

## 2012-11-10 ENCOUNTER — Other Ambulatory Visit: Payer: Self-pay

## 2013-01-27 ENCOUNTER — Emergency Department (HOSPITAL_COMMUNITY): Payer: Managed Care, Other (non HMO)

## 2013-01-27 ENCOUNTER — Inpatient Hospital Stay (HOSPITAL_COMMUNITY)
Admission: EM | Admit: 2013-01-27 | Discharge: 2013-02-02 | DRG: 683 | Disposition: A | Discharge status: Home / Self Care | Payer: Managed Care, Other (non HMO) | Attending: Internal Medicine | Admitting: Internal Medicine

## 2013-01-27 ENCOUNTER — Encounter (HOSPITAL_COMMUNITY): Payer: Self-pay | Admitting: Emergency Medicine

## 2013-01-27 DIAGNOSIS — Z886 Allergy status to analgesic agent status: Secondary | ICD-10-CM

## 2013-01-27 DIAGNOSIS — E872 Acidosis, unspecified: Secondary | ICD-10-CM | POA: Diagnosis present

## 2013-01-27 DIAGNOSIS — R0602 Shortness of breath: Secondary | ICD-10-CM

## 2013-01-27 DIAGNOSIS — R55 Syncope and collapse: Secondary | ICD-10-CM

## 2013-01-27 DIAGNOSIS — D638 Anemia in other chronic diseases classified elsewhere: Secondary | ICD-10-CM | POA: Diagnosis present

## 2013-01-27 DIAGNOSIS — Z833 Family history of diabetes mellitus: Secondary | ICD-10-CM

## 2013-01-27 DIAGNOSIS — N042 Nephrotic syndrome with diffuse membranous glomerulonephritis, unspecified: Secondary | ICD-10-CM | POA: Diagnosis present

## 2013-01-27 DIAGNOSIS — Z79899 Other long term (current) drug therapy: Secondary | ICD-10-CM

## 2013-01-27 DIAGNOSIS — R509 Fever, unspecified: Secondary | ICD-10-CM

## 2013-01-27 DIAGNOSIS — E871 Hypo-osmolality and hyponatremia: Secondary | ICD-10-CM | POA: Diagnosis present

## 2013-01-27 DIAGNOSIS — N189 Chronic kidney disease, unspecified: Secondary | ICD-10-CM | POA: Diagnosis present

## 2013-01-27 DIAGNOSIS — E8809 Other disorders of plasma-protein metabolism, not elsewhere classified: Secondary | ICD-10-CM | POA: Diagnosis present

## 2013-01-27 DIAGNOSIS — R079 Chest pain, unspecified: Secondary | ICD-10-CM

## 2013-01-27 DIAGNOSIS — N059 Unspecified nephritic syndrome with unspecified morphologic changes: Secondary | ICD-10-CM

## 2013-01-27 DIAGNOSIS — K219 Gastro-esophageal reflux disease without esophagitis: Secondary | ICD-10-CM | POA: Diagnosis present

## 2013-01-27 DIAGNOSIS — E119 Type 2 diabetes mellitus without complications: Secondary | ICD-10-CM

## 2013-01-27 DIAGNOSIS — N179 Acute kidney failure, unspecified: Principal | ICD-10-CM

## 2013-01-27 DIAGNOSIS — I129 Hypertensive chronic kidney disease with stage 1 through stage 4 chronic kidney disease, or unspecified chronic kidney disease: Secondary | ICD-10-CM | POA: Diagnosis present

## 2013-01-27 DIAGNOSIS — E785 Hyperlipidemia, unspecified: Secondary | ICD-10-CM | POA: Diagnosis present

## 2013-01-27 DIAGNOSIS — Z8249 Family history of ischemic heart disease and other diseases of the circulatory system: Secondary | ICD-10-CM

## 2013-01-27 DIAGNOSIS — R0600 Dyspnea, unspecified: Secondary | ICD-10-CM | POA: Diagnosis present

## 2013-01-27 DIAGNOSIS — D509 Iron deficiency anemia, unspecified: Secondary | ICD-10-CM | POA: Diagnosis present

## 2013-01-27 DIAGNOSIS — E7800 Pure hypercholesterolemia, unspecified: Secondary | ICD-10-CM | POA: Diagnosis present

## 2013-01-27 DIAGNOSIS — I10 Essential (primary) hypertension: Secondary | ICD-10-CM

## 2013-01-27 LAB — BASIC METABOLIC PANEL
BUN: 45 mg/dL — AB (ref 6–23)
CALCIUM: 7.9 mg/dL — AB (ref 8.4–10.5)
CO2: 22 meq/L (ref 19–32)
CREATININE: 1.57 mg/dL — AB (ref 0.50–1.10)
Chloride: 107 mEq/L (ref 96–112)
GFR calc Af Amer: 47 mL/min — ABNORMAL LOW (ref >90)
GFR calc non Af Amer: 41 mL/min — ABNORMAL LOW (ref >90)
GLUCOSE: 105 mg/dL — AB (ref 70–99)
Potassium: 3.8 mEq/L (ref 3.7–5.3)
SODIUM: 140 meq/L (ref 137–147)

## 2013-01-27 LAB — POCT I-STAT TROPONIN I: Troponin i, poc: 0 ng/mL (ref 0.00–0.08)

## 2013-01-27 LAB — CBC
HEMATOCRIT: 25.6 % — AB (ref 36.0–46.0)
HEMOGLOBIN: 8.6 g/dL — AB (ref 12.0–15.0)
MCH: 25.2 pg — AB (ref 26.0–34.0)
MCHC: 33.6 g/dL (ref 30.0–36.0)
MCV: 75.1 fL — AB (ref 78.0–100.0)
Platelets: 300 10*3/uL (ref 150–400)
RBC: 3.41 MIL/uL — AB (ref 3.87–5.11)
RDW: 13.9 % (ref 11.5–15.5)
WBC: 6.3 10*3/uL (ref 4.0–10.5)

## 2013-01-27 LAB — PRO B NATRIURETIC PEPTIDE: Pro B Natriuretic peptide (BNP): 262.9 pg/mL — ABNORMAL HIGH (ref 0–125)

## 2013-01-27 LAB — D-DIMER, QUANTITATIVE (NOT AT ARMC): D DIMER QUANT: 0.97 ug{FEU}/mL — AB (ref 0.00–0.48)

## 2013-01-27 MED ORDER — ONDANSETRON HCL 4 MG/2ML IJ SOLN
4.0000 mg | Freq: Once | INTRAMUSCULAR | Status: AC
  Administered 2013-01-27: 4 mg via INTRAVENOUS
  Filled 2013-01-27: qty 2

## 2013-01-27 MED ORDER — MORPHINE SULFATE 4 MG/ML IJ SOLN
4.0000 mg | Freq: Once | INTRAMUSCULAR | Status: AC
  Administered 2013-01-27: 4 mg via INTRAVENOUS
  Filled 2013-01-27: qty 1

## 2013-01-27 NOTE — ED Provider Notes (Signed)
CSN: [id number]     Arrival date & time 01/27/13  2044 History   First MD Initiated Contact with Patient 01/27/13 2100     Chief Complaint  Patient presents with  . Chest Pain    HPI: Ms. 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 She was in her normal state until just prior to arrival when she was at church, felt warm and lightheaded. She then had a syncopal episode which lasted a few seconds. When waking she noted left sided chest pain, described as sharp, radiated to her left shoulder blade, constant, pleuritic but not exertional. Associated with SOB but no nausea or vomiting. Pain is worse with movement, no relieving factors. She denies recent infectious symptoms, fever, chills or diarrhea. She has had chest pain previously, no history of CAD that she is aware of. On arrival pain is 6/10.    Past Medical History  Diagnosis Date  . Hypertension   . Diabetes mellitus   . Acid reflux   . High cholesterol   . Renal disorder     kidney issues   Past Surgical History  Procedure Laterality Date  . Abdominal hysterectomy     Family History  Problem Relation Age of Onset  . Hypertension Mother   . Thyroid disease Mother   . Coronary artery disease Father   . Hypertension Father   . Diabetes Father    History  Substance Use Topics  . Smoking status: Never Smoker   . Smokeless tobacco: Not on file  . Alcohol Use: No   OB History   Grav Para Term Preterm Abortions TAB SAB Ect Mult Living                 Review of Systems  Constitutional: Negative for fever, chills, appetite change and fatigue.  Eyes: Negative for photophobia and visual disturbance.  Respiratory: Positive for shortness of breath. Negative for cough.   Cardiovascular: Positive for chest pain. Negative for leg swelling.  Gastrointestinal: Negative for nausea, vomiting, abdominal pain, diarrhea and constipation.   Genitourinary: Negative for dysuria, frequency and decreased urine volume.  Musculoskeletal: Negative for arthralgias, back pain, gait problem and myalgias.  Skin: Negative for color change and wound.  Neurological: Positive for syncope and light-headedness. Negative for dizziness and headaches.  Psychiatric/Behavioral: Negative for confusion and agitation.  All other systems reviewed and are negative.    Allergies  Nsaids  Home Medications   Current Outpatient Rx  Name  Route  Sig  Dispense  Refill  . lisinopril (PRINIVIL,ZESTRIL) 40 MG tablet   Oral   Take 40 mg by mouth daily.           Marland Kitchen LORazepam (ATIVAN) 0.5 MG tablet   Oral   Take 0.5 mg by mouth every 6 (six) hours as needed. For anxiety         . metFORMIN (GLUCOPHAGE) 500 MG tablet   Oral   Take 500 mg by mouth every evening.          . metolazone (ZAROXOLYN) 5 MG tablet   Oral   Take 5 mg by mouth daily.         . Multiple Vitamin (MULTIVITAMIN WITH MINERALS) TABS tablet   Oral   Take 1 tablet by mouth daily.         Marland Kitchen omeprazole (PRILOSEC) 40 MG capsule   Oral  Take 40 mg by mouth daily.           . potassium chloride (K-DUR,KLOR-CON) 10 MEQ tablet   Oral   Take 40 mEq by mouth 2 (two) times daily.         . simvastatin (ZOCOR) 10 MG tablet   Oral   Take 10 mg by mouth at bedtime.         . tacrolimus (PROGRAF) 0.5 MG capsule   Oral   Take 1 mg by mouth 2 (two) times daily.         Marland Kitchen torsemide (DEMADEX) 20 MG tablet   Oral   Take 20 mg by mouth 2 (two) times daily.         . vitamin C (ASCORBIC ACID) 500 MG tablet   Oral   Take 500 mg by mouth daily.          BP 125/52  Pulse 67  Temp(Src) 98.2 F (36.8 C) (Oral)  Resp 18  Ht $R'5\' 7"'se$  (1.702 m)  Wt 259 lb (117.482 kg)  BMI 40.56 kg/m2  SpO2 100% Physical Exam  Nursing note and vitals reviewed. Constitutional: She is oriented to person, place, and time. No distress.  Middle age female, laying in bed, appears  uncomfortable but in no distress  HENT:  Head: Normocephalic and atraumatic.  Mouth/Throat: Oropharynx is clear and moist.  Eyes: Conjunctivae and EOM are normal. Pupils are equal, round, and reactive to light.  Neck: Normal range of motion. Neck supple.  Cardiovascular: Normal rate, regular rhythm, normal heart sounds and intact distal pulses.   Pulmonary/Chest: Effort normal and breath sounds normal. No respiratory distress.  Abdominal: Soft. Bowel sounds are normal. There is no tenderness. There is no rebound and no guarding.  Musculoskeletal: Normal range of motion. She exhibits no edema and no tenderness.  Neurological: She is alert and oriented to person, place, and time. No cranial nerve deficit. Coordination normal.  Skin: Skin is warm and dry. No rash noted.  Psychiatric: She has a normal mood and affect. Her behavior is normal.    ED Course  Procedures (including critical care time) Labs Review Labs Reviewed  CBC - Abnormal; Notable for the following:    RBC 3.41 (*)    Hemoglobin 8.6 (*)    HCT 25.6 (*)    MCV 75.1 (*)    MCH 25.2 (*)    All other components within normal limits  BASIC METABOLIC PANEL - Abnormal; Notable for the following:    Glucose, Bld 105 (*)    BUN 45 (*)    Creatinine, Ser 1.57 (*)    Calcium 7.9 (*)    GFR calc non Af Amer 41 (*)    GFR calc Af Amer 47 (*)    All other components within normal limits  PRO B NATRIURETIC PEPTIDE - Abnormal; Notable for the following:    Pro B Natriuretic peptide (BNP) 262.9 (*)    All other components within normal limits  D-DIMER, QUANTITATIVE - Abnormal; Notable for the following:    D-Dimer, Quant 0.97 (*)    All other components within normal limits  POCT I-STAT TROPONIN I   Imaging Review Dg Chest Port 1 View  01/27/2013   CLINICAL DATA:  Chest pain  EXAM: PORTABLE CHEST - 1 VIEW  COMPARISON:  None.  FINDINGS: The heart size and mediastinal contours are within normal limits. Both lungs are clear. The  visualized skeletal structures are unremarkable.  IMPRESSION: No active disease.  Electronically Signed   By: Kerby Moors M.D.   On: 01/27/2013 21:31    EKG Interpretation    Date/Time: 01/27/2013 2106   Ventricular Rate:   67 PR Interval:   164 QRS Duration:  95 QT Interval:   410 QTC Calculation:  433 R Axis:    54 Text Interpretation:  SR, normal axis, poor R wave progression, BER in lateral leads. No significant change from previous ECG.             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 Initial ECG without STEMI. Episode appears to be vasovagal but given CP in setting of syncope concern for PE. CXR without consolidation, widened mediastinum, effusion or PTX. She has equal pulses, normal BP, doubt aortic dissection. Low suspicion for ACS as ECG without evidence of acute ischemia, troponin negative. Labs reveal anemia with Hgb 8.6 (most recent was 8.6 one week ago), starting erythropoietin soon. Cr elevated to 1.57, normal lytes. D-dimer elevated to 0.97, given kidney disease and AKI unable to obtain CTA. Also, cannot obtain VQ at night. Will admit to Hospitalist service, VQ to be done tomorrow. She was started on full dose heparin gtt to empirically treat for PE awaiting VQ. Pain controlled with morphine. No ASA as patient refused due to kidney disease. She remained HD stable. I spoke to the patient regarding her diagnosis and need for admission, she was in agreement with plan.   Reviewed imaging, labs, ECG and utilized in MDM  Discussed case with Dr. Tawnya Crook  Clinical Impression 1. Syncope 2. Chest pain 3. Elevated d-dimer 4. Acute on chronic kidney injury.     Louretta Shorten, MD 01/29/13 [phone]

## 2013-01-27 NOTE — ED Notes (Addendum)
Pt arrives via EMS from church. Pt states she passed out at church after feeling weak, hot and dizzy. States that she was not feeling well today. States that she has substernal chest pain that radiates to the left arm. No N/V/D. Hx DM, HTN, RF. HR 70 BP 135/75 CBG 103. 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 States that she has had chest pain similar to this when her blood counts were extremely low.

## 2013-01-28 ENCOUNTER — Observation Stay (HOSPITAL_COMMUNITY): Payer: Managed Care, Other (non HMO)

## 2013-01-28 DIAGNOSIS — R55 Syncope and collapse: Secondary | ICD-10-CM

## 2013-01-28 DIAGNOSIS — N179 Acute kidney failure, unspecified: Secondary | ICD-10-CM

## 2013-01-28 DIAGNOSIS — N059 Unspecified nephritic syndrome with unspecified morphologic changes: Secondary | ICD-10-CM

## 2013-01-28 LAB — PROTIME-INR
INR: 1.07 (ref 0.00–1.49)
Prothrombin Time: 13.7 seconds (ref 11.6–15.2)

## 2013-01-28 LAB — GLUCOSE, CAPILLARY
Glucose-Capillary: 101 mg/dL — ABNORMAL HIGH (ref 70–99)
Glucose-Capillary: 85 mg/dL (ref 70–99)
Glucose-Capillary: 97 mg/dL (ref 70–99)
Glucose-Capillary: 113 mg/dL — ABNORMAL HIGH (ref 70–99)
Glucose-Capillary: 119 mg/dL — ABNORMAL HIGH (ref 70–99)

## 2013-01-28 LAB — BASIC METABOLIC PANEL
BUN: 41 mg/dL — ABNORMAL HIGH (ref 6–23)
CALCIUM: 7.8 mg/dL — AB (ref 8.4–10.5)
CO2: 21 mEq/L (ref 19–32)
Chloride: 107 mEq/L (ref 96–112)
Creatinine, Ser: 1.8 mg/dL — ABNORMAL HIGH (ref 0.50–1.10)
GFR calc Af Amer: 40 mL/min — ABNORMAL LOW (ref >90)
GFR, EST NON AFRICAN AMERICAN: 34 mL/min — AB (ref >90)
Glucose, Bld: 102 mg/dL — ABNORMAL HIGH (ref 70–99)
Potassium: 3.7 mEq/L (ref 3.7–5.3)
SODIUM: 140 meq/L (ref 137–147)

## 2013-01-28 LAB — TROPONIN I

## 2013-01-28 LAB — LIPASE, BLOOD: LIPASE: 37 U/L (ref 11–59)

## 2013-01-28 LAB — HEPARIN LEVEL (UNFRACTIONATED): Heparin Unfractionated: 0.25 IU/mL — ABNORMAL LOW (ref 0.30–0.70)

## 2013-01-28 MED ORDER — TORSEMIDE 20 MG PO TABS
20.0000 mg | ORAL_TABLET | Freq: Two times a day (BID) | ORAL | Status: DC
  Administered 2013-01-28: 20 mg via ORAL
  Filled 2013-01-28 (×4): qty 1

## 2013-01-28 MED ORDER — ADULT MULTIVITAMIN W/MINERALS CH
1.0000 | ORAL_TABLET | Freq: Every day | ORAL | Status: DC
  Administered 2013-01-28 – 2013-02-02 (×6): 1 via ORAL
  Filled 2013-01-28 (×6): qty 1

## 2013-01-28 MED ORDER — HEPARIN BOLUS VIA INFUSION
2000.0000 [IU] | Freq: Once | INTRAVENOUS | Status: AC
  Administered 2013-01-28: 2000 [IU] via INTRAVENOUS
  Filled 2013-01-28: qty 2000

## 2013-01-28 MED ORDER — HEPARIN BOLUS VIA INFUSION
4500.0000 [IU] | Freq: Once | INTRAVENOUS | Status: AC
  Administered 2013-01-28: 4500 [IU] via INTRAVENOUS
  Filled 2013-01-28: qty 4500

## 2013-01-28 MED ORDER — GI COCKTAIL ~~LOC~~: 30.0000 mL | Freq: Four times a day (QID) | ORAL | Status: DC | PRN

## 2013-01-28 MED ORDER — VITAMIN C 500 MG PO TABS
500.0000 mg | ORAL_TABLET | Freq: Every day | ORAL | Status: DC
  Administered 2013-01-28 – 2013-02-02 (×6): 500 mg via ORAL
  Filled 2013-01-28 (×6): qty 1

## 2013-01-28 MED ORDER — MORPHINE SULFATE 4 MG/ML IJ SOLN
4.0000 mg | Freq: Once | INTRAMUSCULAR | Status: AC
  Administered 2013-01-28: 4 mg via INTRAVENOUS
  Filled 2013-01-28: qty 1

## 2013-01-28 MED ORDER — LORAZEPAM 0.5 MG PO TABS
0.5000 mg | ORAL_TABLET | Freq: Four times a day (QID) | ORAL | Status: DC | PRN
  Administered 2013-01-29: 0.5 mg via ORAL
  Filled 2013-01-28: qty 1

## 2013-01-28 MED ORDER — ONDANSETRON HCL 4 MG/2ML IJ SOLN
4.0000 mg | Freq: Four times a day (QID) | INTRAMUSCULAR | Status: DC | PRN
  Administered 2013-01-28 – 2013-01-29 (×4): 4 mg via INTRAVENOUS
  Filled 2013-01-28 (×4): qty 2

## 2013-01-28 MED ORDER — SODIUM CHLORIDE 0.9 % IV SOLN
INTRAVENOUS | Status: AC
  Administered 2013-01-28: 02:00:00 via INTRAVENOUS

## 2013-01-28 MED ORDER — LISINOPRIL 40 MG PO TABS
40.0000 mg | ORAL_TABLET | Freq: Every day | ORAL | Status: DC
Start: 2013-01-28 — End: 2013-01-28
  Administered 2013-01-28: 40 mg via ORAL
  Filled 2013-01-28: qty 1

## 2013-01-28 MED ORDER — TECHNETIUM TO 99M ALBUMIN AGGREGATED
6.0000 | Freq: Once | INTRAVENOUS | Status: AC | PRN
  Administered 2013-01-28: 6 via INTRAVENOUS

## 2013-01-28 MED ORDER — MORPHINE SULFATE 2 MG/ML IJ SOLN
2.0000 mg | INTRAMUSCULAR | Status: DC | PRN
  Administered 2013-02-01 (×3): 2 mg via INTRAVENOUS
  Filled 2013-01-28 (×4): qty 1

## 2013-01-28 MED ORDER — ASPIRIN EC 81 MG PO TBEC
162.0000 mg | DELAYED_RELEASE_TABLET | Freq: Once | ORAL | Status: AC
  Administered 2013-01-28: 162 mg via ORAL
  Filled 2013-01-28: qty 2

## 2013-01-28 MED ORDER — PANTOPRAZOLE SODIUM 40 MG PO TBEC
40.0000 mg | DELAYED_RELEASE_TABLET | Freq: Every day | ORAL | Status: DC
  Administered 2013-01-28 – 2013-02-02 (×6): 40 mg via ORAL
  Filled 2013-01-28 (×6): qty 1

## 2013-01-28 MED ORDER — METOLAZONE 5 MG PO TABS
5.0000 mg | ORAL_TABLET | Freq: Every day | ORAL | Status: DC
  Administered 2013-01-28: 5 mg via ORAL
  Filled 2013-01-28 (×2): qty 1

## 2013-01-28 MED ORDER — PROMETHAZINE HCL 25 MG/ML IJ SOLN
12.5000 mg | Freq: Three times a day (TID) | INTRAMUSCULAR | Status: DC | PRN
  Administered 2013-01-28 – 2013-01-29 (×2): 12.5 mg via INTRAVENOUS
  Filled 2013-01-28 (×2): qty 1

## 2013-01-28 MED ORDER — POTASSIUM CHLORIDE CRYS ER 20 MEQ PO TBCR
40.0000 meq | EXTENDED_RELEASE_TABLET | Freq: Two times a day (BID) | ORAL | Status: DC
  Administered 2013-01-28 – 2013-01-30 (×4): 40 meq via ORAL
  Filled 2013-01-28 (×6): qty 2

## 2013-01-28 MED ORDER — SIMVASTATIN 10 MG PO TABS
10.0000 mg | ORAL_TABLET | Freq: Every day | ORAL | Status: DC
Start: 2013-01-28 — End: 2013-02-02
  Administered 2013-01-28 – 2013-02-01 (×6): 10 mg via ORAL
  Filled 2013-01-28 (×7): qty 1

## 2013-01-28 MED ORDER — ENOXAPARIN SODIUM 120 MG/0.8ML ~~LOC~~ SOLN
120.0000 mg | Freq: Two times a day (BID) | SUBCUTANEOUS | Status: DC
  Administered 2013-01-28 – 2013-01-30 (×4): 120 mg via SUBCUTANEOUS
  Filled 2013-01-28 (×7): qty 0.8

## 2013-01-28 MED ORDER — ACETAMINOPHEN 325 MG PO TABS
650.0000 mg | ORAL_TABLET | ORAL | Status: DC | PRN
  Administered 2013-01-29 – 2013-02-02 (×5): 650 mg via ORAL
  Filled 2013-01-28 (×5): qty 2

## 2013-01-28 MED ORDER — TECHNETIUM TC 99M DIETHYLENETRIAME-PENTAACETIC ACID: 40.0000 | Freq: Once | INTRAVENOUS | Status: AC | PRN

## 2013-01-28 MED ORDER — HEPARIN (PORCINE) IN NACL 100-0.45 UNIT/ML-% IJ SOLN
1500.0000 [IU]/h | INTRAMUSCULAR | Status: DC
  Administered 2013-01-28: 1500 [IU]/h via INTRAVENOUS
  Administered 2013-01-28: 1250 [IU]/h via INTRAVENOUS
  Administered 2013-01-28: 1500 [IU]/h via INTRAVENOUS
  Filled 2013-01-28 (×3): qty 250

## 2013-01-28 MED ORDER — HYDROCODONE-ACETAMINOPHEN 5-325 MG PO TABS
1.0000 | ORAL_TABLET | Freq: Four times a day (QID) | ORAL | Status: DC | PRN
  Administered 2013-01-28 – 2013-01-31 (×8): 1 via ORAL
  Filled 2013-01-28 (×8): qty 1

## 2013-01-28 MED ORDER — TACROLIMUS 1 MG PO CAPS
1.0000 mg | ORAL_CAPSULE | Freq: Two times a day (BID) | ORAL | Status: DC
Start: 2013-01-28 — End: 2013-02-02
  Administered 2013-01-28 – 2013-02-02 (×11): 1 mg via ORAL
  Filled 2013-01-28 (×13): qty 1

## 2013-01-28 MED ORDER — LISINOPRIL 40 MG PO TABS
40.0000 mg | ORAL_TABLET | Freq: Every day | ORAL | Status: DC
  Administered 2013-01-28: 40 mg via ORAL
  Filled 2013-01-28 (×2): qty 1

## 2013-01-28 MED ORDER — INSULIN ASPART 100 UNIT/ML ~~LOC~~ SOLN
0.0000 [IU] | Freq: Three times a day (TID) | SUBCUTANEOUS | Status: DC
Start: 2013-01-28 — End: 2013-01-31

## 2013-01-28 NOTE — Consult Note (Signed)
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 Ulice Dash at [facility] nephrology, HTN, DM II, hyperlipidemia who presented to the hospital due to chest pain/syncope/dyspnea.  Renal has been consulted to evaluate her FSGS and immunosuppression therapy.  Pt has been followed by renal at [facility] since dx of FSGS in February 2014.  She was initially started on Metolazone/Lasix/Mycophenolate at that time but developed worsening anemia while on mycophenolate which was stopped about two weeks ago.  She was hospitalized about one month ago secondary to worsening lower extremity edema at which point was d/c on Demadex for better GI absorption and started on tacrolimus 0.5 mg BID.  Creatinine at this hospitalization was between 1.0-1.2.   Pt had follow up with Dr. Ulice Dash on 1/15 at which point he increasd her tacrolimus to 1 mg BID with creatinine at that time of 1.44 which was expected secondary to increase in diuretic and tacrolimus.  Pt scheduled to start aranesp and feraheme on 1/29 at [facility].  Seen in past by Dr. Florene Glen and biopsied.  Has been skipping around to diff Drs.  No remission in NS documented.  Recent start on Tac .  Hosp at [facility] recently with vol xs. Cr at baseline obtained there. Consistently low alb. 1.6-1.9.  Not on anticoag   Medications: Outpatient medications: Prescriptions prior to admission  Medication Sig Dispense Refill  . lisinopril (PRINIVIL,ZESTRIL) 40 MG tablet Take 40 mg by mouth daily.        Marland Kitchen LORazepam (ATIVAN) 0.5 MG tablet Take 0.5 mg by mouth every 6 (six) hours as  needed. For anxiety      . metFORMIN (GLUCOPHAGE) 500 MG tablet Take 500 mg by mouth every evening.       . metolazone (ZAROXOLYN) 5 MG tablet Take 5 mg by mouth daily.      . Multiple Vitamin (MULTIVITAMIN WITH MINERALS) TABS tablet Take 1 tablet by mouth daily.      Marland Kitchen omeprazole (PRILOSEC) 40 MG capsule Take 40 mg by mouth daily.        . potassium chloride (K-DUR,KLOR-CON) 10 MEQ tablet Take 40 mEq by mouth 2 (two) times daily.      . simvastatin (ZOCOR) 10 MG tablet Take 10 mg by mouth at bedtime.      . tacrolimus (PROGRAF) 0.5 MG capsule Take 1 mg by mouth 2 (two) times daily.      Marland Kitchen torsemide (DEMADEX) 20 MG tablet Take 20 mg by mouth 2 (two) times daily.      . vitamin C (ASCORBIC ACID) 500 MG tablet Take 500 mg by mouth daily.        Current medications: Current Facility-Administered Medications  Medication Dose Route Frequency Provider Last Rate Last Dose  . acetaminophen (TYLENOL) tablet 650  mg  650 mg Oral Q4H PRN Phillips Climes, MD      . gi cocktail (Maalox,Lidocaine,Donnatal)  30 mL Oral QID PRN Phillips Climes, MD      . heparin ADULT infusion 100 units/mL (25000 units/250 mL)  1,500 Units/hr Intravenous Continuous Belkys A Regalado, MD 15 mL/hr at 01/28/13 1136 1,500 Units/hr at 01/28/13 1136  . HYDROcodone-acetaminophen (NORCO/VICODIN) 5-325 MG per tablet 1 tablet  1 tablet Oral Q6H PRN Elmarie Shiley, MD   1 tablet at 01/28/13 0830  . insulin aspart (novoLOG) injection 0-9 Units  0-9 Units Subcutaneous TID WC Dawood Elgergawy, MD      . LORazepam (ATIVAN) tablet 0.5 mg  0.5 mg Oral Q6H PRN Phillips Climes, MD      . morphine 2 MG/ML injection 2 mg  2 mg Intravenous Q3H PRN Phillips Climes, MD      . multivitamin with minerals tablet 1 tablet  1 tablet Oral Daily Phillips Climes, MD   1 tablet at 01/28/13 1130  . ondansetron (ZOFRAN) injection 4 mg  4 mg Intravenous Q6H PRN Phillips Climes, MD   4 mg at 01/28/13 0830  . pantoprazole (PROTONIX) EC tablet 40 mg  40  mg Oral Daily Phillips Climes, MD   40 mg at 01/28/13 1134  . simvastatin (ZOCOR) tablet 10 mg  10 mg Oral QHS Phillips Climes, MD   10 mg at 01/28/13 0150  . tacrolimus (PROGRAF) capsule 1 mg  1 mg Oral BID Phillips Climes, MD   1 mg at 01/28/13 1133  . technetium TC 26M diethylenetriame-pentaacetic acid (DTPA) injection 40 milli Curie  40 milli Curie Inhalation Once PRN Medication Radiologist, MD      . vitamin C (ASCORBIC ACID) tablet 500 mg  500 mg Oral Daily Phillips Climes, MD   500 mg at 01/28/13 1133      Allergies: Allergies  Allergen Reactions  . Nsaids     Kidney disease      Past Medical History: Past Medical History  Diagnosis Date  . Hypertension   . Diabetes mellitus   . Acid reflux   . High cholesterol   . Renal disorder     kidney issues     Past Surgical History: Past Surgical History  Procedure Laterality Date  . Abdominal hysterectomy       Family History: Family History  Problem Relation Age of Onset  . Hypertension Mother   . Thyroid disease Mother   . Coronary artery disease Father   . Hypertension Father   . Diabetes Father      Social History: History   Social History  . Marital Status: Single    Spouse Name: N/A    Number of Children: N/A  . Years of Education: N/A   Occupational History  . Not on file.   Social History Main Topics  . Smoking status: Never Smoker   . Smokeless tobacco: Not on file  . Alcohol Use: No  . Drug Use: No  . Sexual Activity: Not on file   Other Topics Concern  . Not on file   Social History Narrative  . No narrative on file     Review of Systems: As per HPI  Vital Signs: Blood pressure 110/72, pulse 61, temperature 98.3 F (36.8 C), temperature source Oral, resp. rate 20, height $RemoveBe'5\' 7"'PPubkaqOV$  (1.702 m), weight 260 lb 8 oz (118.162 kg), SpO2 98.00%.  Weight trends: Filed Weights   01/27/13 2108 01/28/13 0108  Weight: 259 lb (117.482 kg)  260 lb 8 oz (118.162 kg)    Physical Exam: Gen:  NAD, comfortable in chair HEENT: No nystagmus, [place]/AT, MMM Neck: No JVD Cardio: RRR, no murmurs appreciated  Lung: CTA B/L, no bibasilar rales/rhonchi Abdomen: Soft/NT/ND, NABS,  Extremities: +1 pre-tibial edema B/L Neuro: AAO x 3, fine tremor, no pronator drift   Lab results: Basic Metabolic Panel:  Recent Labs Lab 01/27/13 2157  NA 140  K 3.8  CL 107  CO2 22  GLUCOSE 105*  BUN 45*  CREATININE 1.57*  CALCIUM 7.9*   CBC:  Recent Labs Lab 01/27/13 2157  WBC 6.3  HGB 8.6*  HCT 25.6*  MCV 75.1*  PLT 300    Cardiac Enzymes:  Recent Labs Lab 01/28/13 0225 01/28/13 0716  TROPONINI <0.30 <0.30   Microbiology: Results for orders placed during the hospital encounter of 02/28/11  URINE CULTURE     Status: None   Collection Time    02/28/11  7:25 PM      Result Value Range Status   Specimen Description URINE, CLEAN CATCH   Final   Special Requests NONE   Final   Culture  Setup Time [id number]   Final   Colony Count NO GROWTH   Final   Culture NO GROWTH   Final   Report Status 03/01/2011 FINAL   Final    Coagulation Studies:  Recent Labs  01/28/13 0900  LABPROT 13.7  INR 1.07   Imaging: Nm Pulmonary Perf And Vent  01/28/2013   IMPRESSION: No segmental ventilatory or perfusion defect to suggest pulmonary embolism. Given the presence of low lung volumes on the prior exam, inhomogeneity to the ventilatory radiotracer appearance could reflect atelectasis/hypoaeration.   Electronically Signed   By: Conchita Paris M.D.   On: 01/28/2013 10:02   Dg Chest Port 1 View  01/27/2013    IMPRESSION: No active disease.   Electronically Signed   By: Kerby Moors M.D.   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 Ulice Dash at [facility] nephrology, HTN, DM II, hyperlipidemia who presented to the hospital due to chest pain/syncope/dyspnea.   1) FSGS, nephrotic syndrome - Followed at [facility] by  Dr. Ulice Dash.  Records show recent creatinine has trended up to 1.44 and was increased on tacrolimus to 1 mg BID on 1/15.  Concern about tacrolimus toxicity, however, this is not acute renal failure. Nonremitting FSG with rising Cr.  Now on trial of Tac.  Need to cont meds and need to anticoag as high risk for clotting and complic.  Would start here and communicate with Dr. Ulice Dash on Mon. 2) Dyspnea - PE r/o, per primary.  May be related to tacrolimus toxicity  3) HTN - Hold lisinopril in setting of worsening renal failure and stable BP 4) Hyperlipidemia - On statin 5) DM - Hold metformin, SSI per primary 6) Anemia microcytic - Will be getting Ferahme/Aranesp at [facility] on 1/29, however, concern she may have component of iron deficiency from blood loss.  7) Hypoalbuminemia secondary to Nephrotic syndrome   Plan  1) Obtain tacrolimus AM level trough 2) Restart home demadex and Metolazone along with continuing lisinopril as this is not acute renal failure  3) Will start anticoagulation with Lovenox to prevent embolization secondary to decreased albumin   4) Trend daily renal panel 5) Will need to discuss long term options and management with Dr. Ulice Dash on  this patient.   Tamela Oddi Hess, DO of [facility] 01/28/2013, 12:03 PM I have seen and examined this patient and agree with the plan of care seen, eval, counseled, discussed with resident and counseled patient. .  Marina Boerner L 01/28/2013, 1:01 PM

## 2013-01-28 NOTE — Progress Notes (Signed)
ANTICOAGULATION CONSULT NOTE - Initial Consult  Pharmacy Consult for Heparin  Indication: pulmonary embolus, suspected   Allergies  Allergen Reactions  . Nsaids     Kidney disease    Patient Measurements: Height: 5\' 7"  (170.2 cm) Weight: 259 lb (117.482 kg) IBW/kg (Calculated) : 61.6 Heparin Dosing Weight: 89 kg  Vital Signs: Temp: 98.2 F (36.8 C) (01/23 2108) Temp src: Oral (01/23 2108) BP: 132/59 mmHg (01/23 2345) Pulse Rate: 66 (01/23 2345)  Labs:  Recent Labs  01/27/13 2157  HGB 8.6*  HCT 25.6*  PLT 300  CREATININE 1.57*    Estimated Creatinine Clearance: 63.8 ml/min (by C-G formula based on Cr of 1.57).   Medical History: Past Medical History  Diagnosis Date  . Hypertension   . Diabetes mellitus   . Acid reflux   . High cholesterol   . Renal disorder     kidney issues    Assessment: 38 y/o F here after passing out at church, substernal chest pain, h/o glomerulonephritis with elevated Scr, V/Q scan ordered, noted low Hgb (pt states chronic anemia), other labs as above.   Goal of Therapy:  Heparin level 0.3-0.7 units/ml Monitor platelets by anticoagulation protocol: Yes   Plan:  -Heparin 4500 units BOLUS x 1 -Start heparin drip at 1250 units/hr -0900 HL -Daily CBC/HL -Monitor for bleeding  Narda Bonds 01/28/2013,12:21 AM

## 2013-01-28 NOTE — Progress Notes (Signed)
[facility] PROGRESS NOTE  Patricia [name] GMW:[id number] DOB: 1975/08/09 DOA: 01/27/2013 PCP: Tereasa Coop, PA-C  Assessment/Plan: 1-Syncope; in setting of dehydration. monitor on telemetry. VQ scan negative for PE.   2-Chest pain, dyspnea: elevated D dimer at 0.97. V-Q scan no evidence of pulmonary embolism. Will check lower extremities doppler. ECHO.   3-Nephrotic syndrome, FSGS; renal consulted to help with management and adjust medication. Cr at 1.5. Repeat labs this morning. Review Dr Deterning note will change heparin to Lovenox therapeutic dose in setting of nephrotic syndrome, hypoalbuminemia and increase risk for Thrombosis. Appreciate renal help. tacrolimus level in am.   4-Hypercholesterolemia. Continue with statin 5-Hypertension. Acceptable, continue with lisinopril, 6-Diabetes mellitus. We will hold metformin due to her renal failure, will start her on insulin sliding scale 7-Nausea: check lipase, LFT.  8-Anemia; of chronic diseases.   Code Status: Full Code.  Family Communication: Care discussed with patient.  Disposition Plan: Remain inpatient.    Consultants:  Nephrology.   Procedures:  ECHO;  Doppler lower extremities;   Antibiotics:  none  HPI/Subjective: Still with chest pain, intermittent. No significant cough.  Relates nausea.   Objective: Filed Vitals:   01/28/13 0800  BP: 118/63  Pulse: 65  Temp: 98 F (36.7 C)  Resp: 17   No intake or output data in the 24 hours ending 01/28/13 1116 Filed Weights   01/27/13 2108 01/28/13 0108  Weight: 117.482 kg (259 lb) 118.162 kg (260 lb 8 oz)    Exam:   General:  No distress.   Cardiovascular: S 1, S 2 RRR  Respiratory: Few ronchus, diminishes breath sounds.   Abdomen: Bs present, soft, , NT  Musculoskeletal: no edema.   Data Reviewed: Basic Metabolic Panel:  Recent Labs Lab 01/27/13 2157  NA 140  K 3.8  CL 107  CO2 22  GLUCOSE 105*  BUN 45*  CREATININE 1.57*  CALCIUM  7.9*   Liver Function Tests: No results found for this basename: AST, ALT, ALKPHOS, BILITOT, PROT, ALBUMIN,  in the last 168 hours No results found for this basename: LIPASE, AMYLASE,  in the last 168 hours No results found for this basename: AMMONIA,  in the last 168 hours CBC:  Recent Labs Lab 01/27/13 2157  WBC 6.3  HGB 8.6*  HCT 25.6*  MCV 75.1*  PLT 300   Cardiac Enzymes:  Recent Labs Lab 01/28/13 0225 01/28/13 0716  TROPONINI <0.30 <0.30   BNP (last 3 results)  Recent Labs  04/25/12 1243 05/22/12 2307 01/27/13 2157  PROBNP 269.3* 40.7 262.9*   CBG:  Recent Labs Lab 01/28/13 0111 01/28/13 1028  GLUCAP 101* 85    No results found for this or any previous visit (from the past 240 hour(s)).   Studies: Nm Pulmonary Perf And Vent  01/28/2013   CLINICAL DATA:  Chest pain and shortness of breath.  EXAM: NUCLEAR MEDICINE VENTILATION - PERFUSION LUNG SCAN  TECHNIQUE: Ventilation images were obtained in multiple projections using inhaled aerosol technetium 99 M DTPA. Perfusion images were obtained in multiple projections after intravenous injection of Tc-69m MAA.  COMPARISON:  Chest radiograph 01/27/2013  RADIOPHARMACEUTICALS:  Forty mCi Tc-60m DTPA aerosol and 6 mCi Tc-40m MAA  FINDINGS: Ventilation: There is inhomogeneous ventilation without segmental ventilatory defect.  Perfusion: No wedge shaped peripheral perfusion defects to suggest acute pulmonary embolism. Mildly inhomogeneous perfusion is identified.  IMPRESSION: No segmental ventilatory or perfusion defect to suggest pulmonary embolism. Given the presence of low lung volumes on the prior exam, inhomogeneity to the  ventilatory radiotracer appearance could reflect atelectasis/hypoaeration.   Electronically Signed   By: Conchita Paris M.D.   On: 01/28/2013 10:02   Dg Chest Port 1 View  01/27/2013   CLINICAL DATA:  Chest pain  EXAM: PORTABLE CHEST - 1 VIEW  COMPARISON:  None.  FINDINGS: The heart size and  mediastinal contours are within normal limits. Both lungs are clear. The visualized skeletal structures are unremarkable.  IMPRESSION: No active disease.   Electronically Signed   By: Kerby Moors M.D.   On: 01/27/2013 21:31    Scheduled Meds: . heparin  2,000 Units Intravenous Once  . insulin aspart  0-9 Units Subcutaneous TID WC  . multivitamin with minerals  1 tablet Oral Daily  . pantoprazole  40 mg Oral Daily  . simvastatin  10 mg Oral QHS  . tacrolimus  1 mg Oral BID  . vitamin C  500 mg Oral Daily   Continuous Infusions: . sodium chloride 75 mL/hr at 01/28/13 0151  . heparin 1,250 Units/hr (01/28/13 0040)    Active Problems:   Chest pain   Shortness of breath   HTN (hypertension)   DM (diabetes mellitus)   Hypercholesterolemia   Glomerulonephritis   Acute renal failure   Syncope    Time spent: 35 minutes.     Patricia [name]  [facility] Pager [phone]. If 7PM-7AM, please contact night-coverage at [email or web address], password [facility] 01/28/2013, 11:16 AM  LOS: 1 day

## 2013-01-28 NOTE — Progress Notes (Addendum)
[facility] for Heparin >> Lovenox Indication: pulmonary embolus, suspected   Allergies  Allergen Reactions  . Nsaids     Kidney disease    Patient Measurements: Height: 5\' 7"  (170.2 cm) Weight: 260 lb 8 oz (118.162 kg) IBW/kg (Calculated) : 61.6 Heparin Dosing Weight: 89 kg  Vital Signs: Temp: 98 F (36.7 C) (01/24 0800) Temp src: Oral (01/24 0800) BP: 118/63 mmHg (01/24 0800) Pulse Rate: 65 (01/24 0800)  Labs:  Recent Labs  01/27/13 2157 01/28/13 0225 01/28/13 0900  HGB 8.6*  --   --   HCT 25.6*  --   --   PLT 300  --   --   LABPROT  --   --  13.7  INR  --   --  1.07  HEPARINUNFRC  --   --  0.25*  CREATININE 1.57*  --   --   TROPONINI  --  <0.30  --     Estimated Creatinine Clearance: 63.9 ml/min (by C-G formula based on Cr of 1.57).   Assessment: 38 y/o F here after passing out at church, substernal chest pain, h/o glomerulonephritis with elevated Scr, V/Q scan ordered, noted low Hgb (pt states chronic anemia).  First heparin level is 0.25 which is below goal.  Goal of Therapy:  Heparin level 0.3-0.7 units/ml Monitor platelets by anticoagulation protocol: Yes   Plan:  -Heparin 2000 units BOLUS x 1 -Increase heparin drip to 1500units/hr -heparin level in 6 hours -Daily CBC/HL -Monitor for bleeding  Candie Mile 01/28/2013,10:50 AM  PM: Transitioning to Lovenox Plan: Lovenox 120 mg sq Q 12 hours  Thank you. Anette Guarneri, PharmD

## 2013-01-28 NOTE — H&P (Signed)
Patient Demographics  Patricia [name], is a 38 y.o. female  MRN: [id number]   DOB - 09/23/1975  Admit Date - 01/27/2013  Outpatient Primary MD for the patient is LONG,ASHLEY B, PA-C   With History of -  Past Medical History  Diagnosis Date  . Hypertension   . Diabetes mellitus   . Acid reflux   . High cholesterol   . Renal disorder     kidney issues      Past Surgical History  Procedure Laterality Date  . Abdominal hysterectomy      in for   Chief Complaint  Patient presents with  . Chest Pain     HPI  Patricia [name]  is a 38 y.o. female, with known history of glomerulonephritis, been followed at [facility] nephrology, patient is on immunosuppressive medication with Prograft, patient supposed to be started on further immunosuppression next week at [facility], patient presents with complaints of syncope today, and chest pain and shortness of breath, patient reports she was in the church, she started to feel warm, dry with chest pain left-sided radiating to the left arm, sharp in quality, as well developed shortness of breath, and she had an episode of loss of consciousness, patient currently denies any chest pain or shortness of breath, patient had negative troponin, no EKG changes, but had elevated D. dimers, but unfortunately she was in acute renal failure with a creatinine of 1.57, so she could not have CT chest with IV contrast, so hospitalist requested to admit the patient, until she is able to get VQ scan in the morning, meanwhile she was started on anticoagulation with heparin.    Review of Systems    In addition to the HPI above,  No Fever-chills, No Headache, No changes with Vision or hearing, presents with syncope No problems swallowing food or Liquids, Had pleuritic sharp Chest pain, no Cough and mild  Shortness of Breath, No Abdominal pain, No Nausea or Vommitting, Bowel movements are regular, No Blood in stool or Urine, No dysuria, No new skin rashes or bruises, No new joints pains-aches,  No new weakness, tingling, numbness in any extremity, No recent weight gain or loss, No polyuria, polydypsia or polyphagia, No significant Mental Stressors.  A full 10 point Review of Systems was done, except as stated above, all other Review of Systems were negative.   Social History History  Substance Use Topics  . Smoking status: Never Smoker   . Smokeless tobacco: Not on file  . Alcohol Use: No     Family History Family History  Problem Relation Age of Onset  . Hypertension Mother   . Thyroid disease Mother   . Coronary artery disease Father   . Hypertension Father   . Diabetes Father      Prior to Admission medications   Medication Sig Start Date End Date Taking? Authorizing Provider  lisinopril (PRINIVIL,ZESTRIL) 40 MG tablet Take 40 mg by mouth daily.  Yes Historical Provider, MD  LORazepam (ATIVAN) 0.5 MG tablet Take 0.5 mg by mouth every 6 (six) hours as needed. For anxiety 11/02/12  Yes Historical Provider, MD  metFORMIN (GLUCOPHAGE) 500 MG tablet Take 500 mg by mouth every evening.    Yes Historical Provider, MD  metolazone (ZAROXOLYN) 5 MG tablet Take 5 mg by mouth daily.   Yes Historical Provider, MD  Multiple Vitamin (MULTIVITAMIN WITH MINERALS) TABS tablet Take 1 tablet by mouth daily.   Yes Historical Provider, MD  omeprazole (PRILOSEC) 40 MG capsule Take 40 mg by mouth daily.     Yes Historical Provider, MD  potassium chloride (K-DUR,KLOR-CON) 10 MEQ tablet Take 40 mEq by mouth 2 (two) times daily.   Yes Historical Provider, MD  simvastatin (ZOCOR) 10 MG tablet Take 10 mg by mouth at bedtime.   Yes Historical Provider, MD  tacrolimus (PROGRAF) 0.5 MG capsule Take 1 mg by mouth 2 (two) times daily.   Yes Historical Provider, MD  torsemide (DEMADEX) 20 MG tablet  Take 20 mg by mouth 2 (two) times daily.   Yes Historical Provider, MD  vitamin C (ASCORBIC ACID) 500 MG tablet Take 500 mg by mouth daily.   Yes Historical Provider, MD    Allergies  Allergen Reactions  . Nsaids     Kidney disease    Physical Exam  Vitals  Blood pressure 121/55, pulse 67, temperature 98.2 F (36.8 C), temperature source Oral, resp. rate 12, height $RemoveBe'5\' 7"'[id number]$  (1.702 m), weight 117.482 kg (259 lb), SpO2 100.00%.   1. General well-nourished female lying in bed in NAD,   2. Normal affect and insight, Not Suicidal or Homicidal, Awake Alert, Oriented X 3.  3. No F.N deficits, ALL C.Nerves Intact, Strength 5/5 all 4 extremities, Sensation intact all 4 extremities, Plantars down going.  4. Ears and Eyes appear Normal, Conjunctivae clear, PERRLA. Moist Oral Mucosa.  5. Supple Neck, No JVD, No cervical lymphadenopathy appriciated, No Carotid Bruits.  6. Symmetrical Chest wall movement, Good air movement bilaterally, CTAB.  7. RRR, No Gallops, Rubs or Murmurs, No Parasternal Heave.  8. Positive Bowel Sounds, Abdomen Soft, Non tender, No organomegaly appriciated,No rebound -guarding or rigidity.  9.  No Cyanosis, Normal Skin Turgor, No Skin Rash or Bruise.  10. Good muscle tone,  joints appear normal , no effusions, Normal ROM. Mild pitting edema bilaterally 11. No Palpable Lymph Nodes in Neck or Axillae  Data  Data Review  CBC  Recent Labs Lab 01/27/13 2157  WBC 6.3  HGB 8.6*  HCT 25.6*  PLT 300  MCV 75.1*  MCH 25.2*  MCHC 33.6  RDW 13.9   ------------------------------------------------------------------------------------------------------------------  Chemistries   Recent Labs Lab 01/27/13 2157  NA 140  K 3.8  CL 107  CO2 22  GLUCOSE 105*  BUN 45*  CREATININE 1.57*  CALCIUM 7.9*   ------------------------------------------------------------------------------------------------------------------ estimated creatinine clearance is 63.8 ml/min  (by C-G formula based on Cr of 1.57). ------------------------------------------------------------------------------------------------------------------ No results found for this basename: TSH, T4TOTAL, FREET3, T3FREE, THYROIDAB,  in the last 72 hours   Coagulation profile No results found for this basename: INR, PROTIME,  in the last 168 hours -------------------------------------------------------------------------------------------------------------------  Recent Labs  01/27/13 2300  DDIMER 0.97*   -------------------------------------------------------------------------------------------------------------------  Cardiac Enzymes No results found for this basename: CK, CKMB, TROPONINI, MYOGLOBIN,  in the last 168 hours ------------------------------------------------------------------------------------------------------------------ No components found with this basename: POCBNP,    ---------------------------------------------------------------------------------------------------------------  Urinalysis    Component Value Date/Time   COLORURINE YELLOW 03/23/2012 1854  APPEARANCEUR CLEAR 03/23/2012 1854   LABSPEC 1.022 03/23/2012 1854   PHURINE 6.0 03/23/2012 [place] 03/23/2012 [place] 03/23/2012 [place] 03/23/2012 [place] 03/23/2012 1854   PROTEINUR >300* 03/23/2012 1854   UROBILINOGEN 0.2 03/23/2012 1854   NITRITE NEGATIVE 03/23/2012 [place] 03/23/2012 1854    ----------------------------------------------------------------------------------------------------------------  Imaging results:   Dg Chest Port 1 View  01/27/2013   CLINICAL DATA:  Chest pain  EXAM: PORTABLE CHEST - 1 VIEW  COMPARISON:  None.  FINDINGS: The heart size and mediastinal contours are within normal limits. Both lungs are clear. The visualized skeletal structures are unremarkable.  IMPRESSION: No active disease.    Electronically Signed   By: Kerby Moors M.D.   On: 01/27/2013 21:31    My personal review of EKG: Rhythm NSR, Rate 65 beats per minute, no significant ST or T wave abnormalities   Assessment & Plan  Active Problems:   Chest pain   Shortness of breath   HTN (hypertension)   DM (diabetes mellitus)   Hypercholesterolemia   Glomerulonephritis   Acute renal failure   Syncope    1. chest pain with shortness of breath and elevated D. dimers. Patient will be admitted to telemetry unit, continue to cycle cardiac enzymes and follow the trend, will order 162 mg by mouth aspirin for her, and likely chest pain of cardiac origin, as well will need to rule out PE especially with the elevated D. dimers, so we'll keep her on heparin drip and we are able to do VQ scan in the morning, as we are currently unable to do a CT chest angiogram it due to her acute renal failure  2. Syncope. This is most likely related to dehydration and acute renal failure, patient will be monitored on telemetry, will continue to follow cardiac enzymes, we'll continue with gentle hydration as she is known to have nephrotic syndrome  3. Activity heart failure. This is most likely related to her baseline glomerulonephritis, as well she is on diuresis for her nephrotic syndrome, we will hold her torsemide and metolazone to the morning, as well we'll have him on gentle hydration total of 750 mils of IV normal saline over the next 10 hours, to be very careful with fluids and holding her diuresis it due to her nephrotic syndrome.  4. Glomerulonephritis. We will continue the patient on Prograf. And will continue her on lisinopril, will resume her torsemide and metolazone were negative renal failure improves  5. Hypertension. Acceptable, continue with lisinopril, resume diuresis when renal failure improves  6. Hypercholesterolemia. Continue with statin  7. Diabetes mellitus. We will hold metformin due to her renal failure, will  start her on insulin sliding scale  DVT Prophylaxis on full dose anticoagulation with heparin  AM Labs Ordered, also please review Full Orders  Family Communication: Admission, patients condition and plan of care including tests being ordered have been discussed with the patient  who indicate understanding and agree with the plan and Code Status.  Code Status FULL  Likely DC to  HOME  Condition GUARDED  Time spent in minutes : 70min    ELGERGAWY, DAWOOD M.D on 01/28/2013 at 12:49 AM  Between 7am to 7pm - After 7pm go to [email or web address] - password [email or web address]  And look for the night coverage person covering me after hours  [facility] Office  [phone]

## 2013-01-29 DIAGNOSIS — R079 Chest pain, unspecified: Secondary | ICD-10-CM

## 2013-01-29 DIAGNOSIS — I517 Cardiomegaly: Secondary | ICD-10-CM

## 2013-01-29 LAB — RENAL FUNCTION PANEL
Albumin: 1.1 g/dL — ABNORMAL LOW (ref 3.5–5.2)
BUN: 41 mg/dL — ABNORMAL HIGH (ref 6–23)
CHLORIDE: 105 meq/L (ref 96–112)
CO2: 23 mEq/L (ref 19–32)
Calcium: 7.9 mg/dL — ABNORMAL LOW (ref 8.4–10.5)
Creatinine, Ser: 2.5 mg/dL — ABNORMAL HIGH (ref 0.50–1.10)
GFR calc Af Amer: 27 mL/min — ABNORMAL LOW (ref >90)
GFR calc non Af Amer: 23 mL/min — ABNORMAL LOW (ref >90)
Glucose, Bld: 98 mg/dL (ref 70–99)
PHOSPHORUS: 5.3 mg/dL — AB (ref 2.3–4.6)
POTASSIUM: 3.8 meq/L (ref 3.7–5.3)
SODIUM: 138 meq/L (ref 137–147)

## 2013-01-29 LAB — GLUCOSE, CAPILLARY
GLUCOSE-CAPILLARY: 79 mg/dL (ref 70–99)
Glucose-Capillary: 87 mg/dL (ref 70–99)
Glucose-Capillary: 91 mg/dL (ref 70–99)
Glucose-Capillary: 118 mg/dL — ABNORMAL HIGH (ref 70–99)

## 2013-01-29 LAB — HEPATIC FUNCTION PANEL
ALBUMIN: 1.1 g/dL — AB (ref 3.5–5.2)
ALT: 11 U/L (ref 0–35)
AST: 15 U/L (ref 0–37)
Alkaline Phosphatase: 34 U/L — ABNORMAL LOW (ref 39–117)
Bilirubin, Direct: <0.2 mg/dL (ref 0.0–0.3)
TOTAL PROTEIN: 4.2 g/dL — AB (ref 6.0–8.3)
Total Bilirubin: <0.2 mg/dL — ABNORMAL LOW (ref 0.3–1.2)

## 2013-01-29 NOTE — ED Provider Notes (Signed)
Medical screening examination/treatment/procedure(s) were conducted as a shared visit with resident-physician practitioner(s) and myself.  I personally evaluated the patient during the encounter.  Pt is a 38 y.o. female with pmhx as above presenting with sudden onset CP, then syncope.  Pt found to have nml EKG, CXR, trop, but elevated d-dimer. Cannot get CTA given kidney function. Syncopal episode concerning for arrythmia given onset w/ CP vs PE.  Will start heparin, admit to [facility].  EKG Interpretation    Date/Time:  Friday January 27 2013 21:06:43 EST Ventricular Rate:  67 PR Interval:  164 QRS Duration: 95 QT Interval:  410 QTC Calculation: 433 R Axis:   54 Text Interpretation:  Sinus rhythm Low voltage, precordial leads No significant change was found Confirmed by Sims  MD, Jacobey Gura ([id number]) on 01/29/2013 12:33:50 AM             Neta Ehlers, MD 01/29/13 [phone]

## 2013-01-29 NOTE — Progress Notes (Signed)
VASCULAR LAB PRELIMINARY  PRELIMINARY  PRELIMINARY  PRELIMINARY  Bilateral lower extremity venous duplex  completed.    Preliminary report:  Bilateral:  No evidence of DVT, superficial thrombosis, or Baker's Cyst.    Parys Elenbaas, RVT 01/29/2013, 11:20 AM

## 2013-01-29 NOTE — Progress Notes (Signed)
Utilization Review Completed.   Saamir Armstrong, RN, BSN Nurse Case Manager  

## 2013-01-29 NOTE — Progress Notes (Signed)
[facility] PROGRESS NOTE  Patricia [name] FUX:[id number] DOB: 07-08-1975 DOA: 01/27/2013 PCP: Tereasa Coop, PA-C  Assessment/Plan:  1-Nephrotic Syndrome, FSGS; renal consulted to help with management and adjust medication. Cr at 1.5 on admission.    -Hypoalbuminemia and increase risk for Thrombosis, on therapeutic dose Lovenox. Marland Kitchen Appreciate renal help. tacrolimus level pending. -Worsening renal function. ACE and diuretics on hold. Cr increase to 2.5. -MRA order to rule out renal vein thrombosis.   2-Chest pain, dyspnea: elevated D dimer at 0.97. V-Q scan no evidence of pulmonary embolism.  lower extremities doppler: Bilateral: No evidence of DVT, superficial thrombosis, or Baker's Cyst. ECHO: normal EF.    3-Syncope; in setting of dehydration. monitor on telemetry. VQ scan negative for PE.  4-Hypercholesterolemia. Continue with statin 5-Hypertension. Holding lisinopril.  6-Diabetes mellitus. We will hold metformin due to her renal failure, will start her on insulin sliding scale 7-Nausea:  Lipase 37, normal LFT.  8-Anemia; of chronic diseases.   Code Status: Full Code.  Family Communication: Care discussed with patient.  Disposition Plan: Remain inpatient.    Consultants:  Nephrology.   Procedures: ECHO;Study data: Technically adequate study. - Left ventricle: The cavity size was normal. Wall thickness was normal. Systolic function was normal. The estimated ejection fraction was in the range of 60% to 65%. Wall motion was normal; there were no regional wall motion abnormalities. Left ventricular diastolic function parameters were normal. - Aortic valve: Valve area: 3.17cm^2(VTI). Valve area: 2.64cm^2 (Vmax). - Left atrium: The atrium was mildly dilated.  Doppler lower extremities:  Antibiotics:  none  HPI/Subjective: Chest pain better.  Dyspnea on exertion. She is worry, about her renal function.   Objective: Filed Vitals:   01/29/13 0400  BP: 103/54   Pulse: 66  Temp: 98.9 F (37.2 C)  Resp: 18   No intake or output data in the 24 hours ending 01/29/13 1449 Filed Weights   01/27/13 2108 01/28/13 0108  Weight: 117.482 kg (259 lb) 118.162 kg (260 lb 8 oz)    Exam:   General:  No distress.   Cardiovascular: S 1, S 2 RRR  Respiratory: Few ronchus, diminishes breath sounds.   Abdomen: Bs present, soft, , NT  Musculoskeletal: no edema.   Data Reviewed: Basic Metabolic Panel:  Recent Labs Lab 01/27/13 2157 01/28/13 1130 01/29/13 0435  NA 140 140 138  K 3.8 3.7 3.8  CL 107 107 105  CO2 $Re'22 21 23  'MQV$ GLUCOSE 105* 102* 98  BUN 45* 41* 41*  CREATININE 1.57* 1.80* 2.50*  CALCIUM 7.9* 7.8* 7.9*  PHOS  --   --  5.3*   Liver Function Tests:  Recent Labs Lab 01/29/13 0435  AST 15  ALT 11  ALKPHOS 34*  BILITOT <0.2*  PROT 4.2*  ALBUMIN 1.1*  1.1*    Recent Labs Lab 01/28/13 1130  LIPASE 37   No results found for this basename: AMMONIA,  in the last 168 hours CBC:  Recent Labs Lab 01/27/13 2157  WBC 6.3  HGB 8.6*  HCT 25.6*  MCV 75.1*  PLT 300   Cardiac Enzymes:  Recent Labs Lab 01/28/13 0225 01/28/13 0716 01/28/13 1130  TROPONINI <0.30 <0.30 <0.30   BNP (last 3 results)  Recent Labs  04/25/12 1243 05/22/12 2307 01/27/13 2157  PROBNP 269.3* 40.7 262.9*   CBG:  Recent Labs Lab 01/28/13 1221 01/28/13 1707 01/28/13 2153 01/29/13 0744 01/29/13 1214  GLUCAP 97 113* 119* 79 87    No results found for this or any previous  visit (from the past 240 hour(s)).   Studies: Nm Pulmonary Perf And Vent  01/28/2013   CLINICAL DATA:  Chest pain and shortness of breath.  EXAM: NUCLEAR MEDICINE VENTILATION - PERFUSION LUNG SCAN  TECHNIQUE: Ventilation images were obtained in multiple projections using inhaled aerosol technetium 99 M DTPA. Perfusion images were obtained in multiple projections after intravenous injection of Tc-40m MAA.  COMPARISON:  Chest radiograph 01/27/2013   RADIOPHARMACEUTICALS:  Forty mCi Tc-18m DTPA aerosol and 6 mCi Tc-60m MAA  FINDINGS: Ventilation: There is inhomogeneous ventilation without segmental ventilatory defect.  Perfusion: No wedge shaped peripheral perfusion defects to suggest acute pulmonary embolism. Mildly inhomogeneous perfusion is identified.  IMPRESSION: No segmental ventilatory or perfusion defect to suggest pulmonary embolism. Given the presence of low lung volumes on the prior exam, inhomogeneity to the ventilatory radiotracer appearance could reflect atelectasis/hypoaeration.   Electronically Signed   By: Conchita Paris M.D.   On: 01/28/2013 10:02   Dg Chest Port 1 View  01/27/2013   CLINICAL DATA:  Chest pain  EXAM: PORTABLE CHEST - 1 VIEW  COMPARISON:  None.  FINDINGS: The heart size and mediastinal contours are within normal limits. Both lungs are clear. The visualized skeletal structures are unremarkable.  IMPRESSION: No active disease.   Electronically Signed   By: Kerby Moors M.D.   On: 01/27/2013 21:31    Scheduled Meds: . enoxaparin (LOVENOX) injection  120 mg Subcutaneous Q12H  . insulin aspart  0-9 Units Subcutaneous TID WC  . multivitamin with minerals  1 tablet Oral Daily  . pantoprazole  40 mg Oral Daily  . potassium chloride  40 mEq Oral BID  . simvastatin  10 mg Oral QHS  . tacrolimus  1 mg Oral BID  . vitamin C  500 mg Oral Daily   Continuous Infusions:    Active Problems:   Chest pain   Shortness of breath   HTN (hypertension)   DM (diabetes mellitus)   Hypercholesterolemia   Glomerulonephritis   Acute renal failure   Syncope    Time spent: 25 minutes.     REGALADO,BELKYS  [facility] Pager [phone]. If 7PM-7AM, please contact night-coverage at [email or web address], password [email or web address] 01/29/2013, 2:49 PM  LOS: 2 days

## 2013-01-29 NOTE — Progress Notes (Signed)
  Echocardiogram 2D Echocardiogram has been performed.  Nyrie Sigal FRANCES 01/29/2013, 11:38 AM

## 2013-01-29 NOTE — Progress Notes (Signed)
[facility] Progress Note   Subjective:   Reports [name] flank pain this am. Also reports some lower abdominal discomfort.    Objective:   BP 103/54  Pulse 66  Temp(Src) 98.9 F (37.2 C) (Oral)  Resp 18  Ht $R'5\' 7"'OT$  (1.702 m)  Wt 260 lb 8 oz (118.162 kg)  BMI 40.79 kg/m2  SpO2 100%  Intake/Output Summary (Last 24 hours) at 01/29/13 1030 Last data filed at 01/28/13 1230  Gross per 24 hour  Intake    720 ml  Output      0 ml  Net    720 ml   Physical Exam: General: resting comfortably in bed. NAD.  Cardiovascular: RRR. No murmur appreciated.  Respiratory: CTAB.  Abdomen: soft, mild lower abdominal tenderness. Nondistended. +CVA tenderness on left.  Extremities: Trace LE edema.  Neuro: No focal deficits.  Imaging: Nm Pulmonary Perf And Vent  01/28/2013   CLINICAL DATA:  Chest pain and shortness of breath.  EXAM: NUCLEAR MEDICINE VENTILATION - PERFUSION LUNG SCAN  TECHNIQUE: Ventilation images were obtained in multiple projections using inhaled aerosol technetium 99 M DTPA. Perfusion images were obtained in multiple projections after intravenous injection of Tc-7m MAA.  COMPARISON:  Chest radiograph 01/27/2013  RADIOPHARMACEUTICALS:  Forty mCi Tc-37m DTPA aerosol and 6 mCi Tc-47m MAA  FINDINGS: Ventilation: There is inhomogeneous ventilation without segmental ventilatory defect.  Perfusion: No wedge shaped peripheral perfusion defects to suggest acute pulmonary embolism. Mildly inhomogeneous perfusion is identified.  IMPRESSION: No segmental ventilatory or perfusion defect to suggest pulmonary embolism. Given the presence of low lung volumes on the prior exam, inhomogeneity to the ventilatory radiotracer appearance could reflect atelectasis/hypoaeration.   Electronically Signed   By: Conchita Paris M.D.   On: 01/28/2013 10:02   Dg Chest Port 1 View  01/27/2013   CLINICAL DATA:  Chest pain  EXAM: PORTABLE CHEST - 1 VIEW  COMPARISON:  None.  FINDINGS: The heart size and  mediastinal contours are within normal limits. Both lungs are clear. The visualized skeletal structures are unremarkable.  IMPRESSION: No active disease.   Electronically Signed   By: Kerby Moors M.D.   On: 01/27/2013 21:31   Labs: BMET  Recent Labs Lab 01/27/13 2157 01/28/13 1130 01/29/13 0435  NA 140 140 138  K 3.8 3.7 3.8  CL 107 107 105  CO2 $Re'22 21 23  'yvU$ GLUCOSE 105* 102* 98  BUN 45* 41* 41*  CREATININE 1.57* 1.80* 2.50*  CALCIUM 7.9* 7.8* 7.9*  PHOS  --   --  5.3*   CBC  Recent Labs Lab 01/27/13 2157  WBC 6.3  HGB 8.6*  HCT 25.6*  MCV 75.1*  PLT 300    Medications:    . enoxaparin (LOVENOX) injection  120 mg Subcutaneous Q12H  . insulin aspart  0-9 Units Subcutaneous TID WC  . lisinopril  40 mg Oral Daily  . metolazone  5 mg Oral Daily  . multivitamin with minerals  1 tablet Oral Daily  . pantoprazole  40 mg Oral Daily  . potassium chloride  40 mEq Oral BID  . simvastatin  10 mg Oral QHS  . tacrolimus  1 mg Oral BID  . torsemide  20 mg Oral BID  . 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 Ulice Dash at [facility] nephrology, HTN, DM II, hyperlipidemia who presented to the hospital due  to chest pain/syncope/dyspnea.   1) FSGS, nephrotic syndrome - Followed at [facility] by Dr. Ulice Dash. Records show recent creatinine has trended up to 1.44 and was increased on tacrolimus to 1 mg BID on 1/15. Cr rising.  Differential dx:  RVT, ATN with severe NS, drug toxicity.  Will eval renal veins, look at urine, stop diuretics and ACEI. [name] flank pain worrisome 2) Dyspnea - PE r/o, per primary. May be related to tacrolimus toxicity  3) HTN - Hold lisinopril in setting of worsening renal failure and stable BP  4) Hyperlipidemia - On statin  5) DM - Hold metformin, SSI per primary  6) Anemia microcytic 7) Hypoalbuminemia secondary to Nephrotic syndrome   Plan:  FSGS, nephrotic syndrome -  Creatinine rising (1.8 >> 2.5).  ? From volume depletion.  Concern for Renal vein thrombosis. - Will discontinue Lisinopril, Metolazone and Torsemide in setting of rising creatinine.   - MRA ordered today to evaluate for Renal vein thrombosis.  - Tacrolimus level in process - Patient now on anticoagulation with Lovenox; will need long term anticoagulation if no improvement in albumin - Will monitor closely; Daily Renal panel.   Patricia Spikes, DO PGY-2, [facility] 01/29/2013, 10:30 AM I have seen and examined this patient and agree with the plan of care seen, examined, eval and discussed with resident and patient .  Patricia [name] 01/29/2013, 2:19 PM

## 2013-01-30 ENCOUNTER — Inpatient Hospital Stay (HOSPITAL_COMMUNITY): Payer: Managed Care, Other (non HMO)

## 2013-01-30 DIAGNOSIS — N179 Acute kidney failure, unspecified: Secondary | ICD-10-CM

## 2013-01-30 DIAGNOSIS — E119 Type 2 diabetes mellitus without complications: Secondary | ICD-10-CM

## 2013-01-30 DIAGNOSIS — R55 Syncope and collapse: Secondary | ICD-10-CM

## 2013-01-30 DIAGNOSIS — N059 Unspecified nephritic syndrome with unspecified morphologic changes: Secondary | ICD-10-CM

## 2013-01-30 LAB — URINALYSIS, ROUTINE W REFLEX MICROSCOPIC
BILIRUBIN URINE: NEGATIVE
Glucose, UA: 100 mg/dL — AB
Ketones, ur: NEGATIVE mg/dL
Leukocytes, UA: NEGATIVE
Nitrite: NEGATIVE
Specific Gravity, Urine: 1.017 (ref 1.005–1.030)
UROBILINOGEN UA: 0.2 mg/dL (ref 0.0–1.0)
pH: 6.5 (ref 5.0–8.0)

## 2013-01-30 LAB — CBC
HCT: 25.3 % — ABNORMAL LOW (ref 36.0–46.0)
Hemoglobin: 8.3 g/dL — ABNORMAL LOW (ref 12.0–15.0)
MCH: 25.2 pg — ABNORMAL LOW (ref 26.0–34.0)
MCHC: 32.8 g/dL (ref 30.0–36.0)
MCV: 76.7 fL — ABNORMAL LOW (ref 78.0–100.0)
PLATELETS: 226 10*3/uL (ref 150–400)
RBC: 3.3 MIL/uL — ABNORMAL LOW (ref 3.87–5.11)
RDW: 14.1 % (ref 11.5–15.5)
WBC: 4.2 10*3/uL (ref 4.0–10.5)

## 2013-01-30 LAB — URINE MICROSCOPIC-ADD ON

## 2013-01-30 LAB — COMPREHENSIVE METABOLIC PANEL
ALBUMIN: 0.9 g/dL — AB (ref 3.5–5.2)
ALT: 10 U/L (ref 0–35)
AST: 14 U/L (ref 0–37)
Alkaline Phosphatase: 37 U/L — ABNORMAL LOW (ref 39–117)
BUN: 42 mg/dL — AB (ref 6–23)
CO2: 20 mEq/L (ref 19–32)
CREATININE: 2.6 mg/dL — AB (ref 0.50–1.10)
Calcium: 7.4 mg/dL — ABNORMAL LOW (ref 8.4–10.5)
Chloride: 105 mEq/L (ref 96–112)
GFR calc Af Amer: 26 mL/min — ABNORMAL LOW (ref >90)
GFR, EST NON AFRICAN AMERICAN: 22 mL/min — AB (ref >90)
Glucose, Bld: 101 mg/dL — ABNORMAL HIGH (ref 70–99)
Potassium: 4.4 mEq/L (ref 3.7–5.3)
Sodium: 136 mEq/L — ABNORMAL LOW (ref 137–147)
Total Bilirubin: <0.2 mg/dL — ABNORMAL LOW (ref 0.3–1.2)
Total Protein: 4.1 g/dL — ABNORMAL LOW (ref 6.0–8.3)

## 2013-01-30 LAB — INFLUENZA PANEL BY PCR (TYPE A & B)
H1N1 flu by pcr: NOT DETECTED
Influenza A By PCR: NEGATIVE
Influenza B By PCR: NEGATIVE

## 2013-01-30 LAB — GLUCOSE, CAPILLARY
GLUCOSE-CAPILLARY: 79 mg/dL (ref 70–99)
Glucose-Capillary: 92 mg/dL (ref 70–99)
Glucose-Capillary: 97 mg/dL (ref 70–99)

## 2013-01-30 LAB — PHOSPHORUS: PHOSPHORUS: 3.6 mg/dL (ref 2.3–4.6)

## 2013-01-30 LAB — TSH: TSH: 0.69 u[IU]/mL (ref 0.350–4.500)

## 2013-01-30 LAB — LACTATE DEHYDROGENASE: LDH: 192 U/L (ref 94–250)

## 2013-01-30 MED ORDER — POTASSIUM CHLORIDE CRYS ER 20 MEQ PO TBCR: 40.0000 meq | EXTENDED_RELEASE_TABLET | Freq: Two times a day (BID) | ORAL | Status: DC

## 2013-01-30 MED ORDER — HEPARIN (PORCINE) IN NACL 100-0.45 UNIT/ML-% IJ SOLN
1500.0000 [IU]/h | INTRAMUSCULAR | Status: DC
Start: 2013-01-30 — End: 2013-01-31
  Administered 2013-01-30: 1500 [IU]/h via INTRAVENOUS
  Filled 2013-01-30 (×2): qty 250

## 2013-01-30 MED ORDER — POTASSIUM CHLORIDE CRYS ER 10 MEQ PO TBCR
40.0000 meq | EXTENDED_RELEASE_TABLET | Freq: Two times a day (BID) | ORAL | Status: DC
  Administered 2013-01-30 – 2013-01-31 (×3): 40 meq via ORAL
  Filled 2013-01-30 (×5): qty 4

## 2013-01-30 MED ORDER — DEXTROSE 5 % IV SOLN
1.0000 g | INTRAVENOUS | Status: DC
  Administered 2013-01-30: 1 g via INTRAVENOUS
  Filled 2013-01-30: qty 10

## 2013-01-30 MED ORDER — SORBITOL 70 % SOLN
30.0000 mL | Freq: Every day | Status: DC | PRN
  Filled 2013-01-30: qty 30

## 2013-01-30 MED ORDER — PIPERACILLIN-TAZOBACTAM 3.375 G IVPB
3.3750 g | Freq: Three times a day (TID) | INTRAVENOUS | Status: DC
  Administered 2013-01-30 – 2013-02-01 (×6): 3.375 g via INTRAVENOUS
  Filled 2013-01-30 (×7): qty 50

## 2013-01-30 MED ORDER — POLYETHYLENE GLYCOL 3350 17 G PO PACK
17.0000 g | PACK | Freq: Two times a day (BID) | ORAL | Status: DC
  Filled 2013-01-30 (×8): qty 1

## 2013-01-30 NOTE — Progress Notes (Signed)
ANTICOAGULATION and ANTIBIOTIC CONSULT NOTE - Follow Up Consult  Pharmacy Consult for zosyn; heparin Indication: UTI, PNA;   Allergies  Allergen Reactions  . Nsaids     Kidney disease    Patient Measurements: Height: 5\' 7"  (170.2 cm) Weight: 260 lb 8 oz (118.162 kg) IBW/kg (Calculated) : 61.6 Heparin Dosing Weight: 89 kg  Vital Signs: Temp: 100.9 F (38.3 C) (01/26 0600) Temp src: Oral (01/26 0600) BP: 104/51 mmHg (01/26 0600) Pulse Rate: 86 (01/26 0600)  Labs:  Recent Labs  01/27/13 2157 01/28/13 0225 01/28/13 0716 01/28/13 0900 01/28/13 1130 01/29/13 0435 01/30/13 0100  HGB 8.6*  --   --   --   --   --   --   HCT 25.6*  --   --   --   --   --   --   PLT 300  --   --   --   --   --   --   LABPROT  --   --   --  13.7  --   --   --   INR  --   --   --  1.07  --   --   --   HEPARINUNFRC  --   --   --  0.25*  --   --   --   CREATININE 1.57*  --   --   --  1.80* 2.50* 2.60*  TROPONINI  --  <0.30 <0.30  --  <0.30  --   --     Estimated Creatinine Clearance: 38.6 ml/min (by C-G formula based on Cr of 2.6).   Assessment: Patient is a 38 y.o F with nephrotic syndrome currently on lovenox for prevention of thrombosis. Scr continues to rise with 2.60 (est crcl~ 38) today.  To change anticoagulation from lovenox to heparin per Dr. Tyrell Antonio.   Lovenox 120mg  was given this morning at ~0600.  Will start heparin drip after completion of ~75% of lovenox dosing interval.    Patient is febrile this morning with Tmax 101.5. Blood and urine cultures have been sent.  To start zosyn for broad empiric coverage.  Goal of Therapy:  Heparin level 0.3-0.7 units/ml Monitor platelets by anticoagulation protocol: Yes   Plan:  1) start heparin drip  1500 units/hr (no bolus) at 3 PM today 2) check 8 hour heparin level 3) zosyn 3.375gm IV q8h (infuse over 4 hours)  Veverly Larimer P 01/30/2013,12:24 PM

## 2013-01-30 NOTE — Progress Notes (Signed)
Patricia [name] Progress Note   Subjective:   Pt feeling febrile, weak, still having L CVA pain   Objective:   BP 104/51  Pulse 86  Temp(Src) 100.9 F (38.3 C) (Oral)  Resp 18  Ht $R'5\' 7"'ZZ$  (1.702 m)  Wt 260 lb 8 oz (118.162 kg)  BMI 40.79 kg/m2  SpO2 100%  Intake/Output Summary (Last 24 hours) at 01/30/13 0747 Last data filed at 01/30/13 0000  Gross per 24 hour  Intake    960 ml  Output      0 ml  Net    960 ml   Physical Exam: General: NAD.  Cardiovascular: RRR. No murmur appreciated.  Respiratory: CTAB.  Abdomen: soft, mild lower abdominal tenderness. Nondistended. +CVA tenderness on left.  Extremities: Trace LE edema.  Neuro: No focal deficits.  Labs: BMET  Recent Labs Lab 01/27/13 2157 01/28/13 1130 01/29/13 0435 01/30/13 0100  NA 140 140 138 136*  K 3.8 3.7 3.8 4.4  CL 107 107 105 105  CO2 $Re'22 21 23 20  'Pwi$ GLUCOSE 105* 102* 98 101*  BUN 45* 41* 41* 42*  CREATININE 1.57* 1.80* 2.50* 2.60*  CALCIUM 7.9* 7.8* 7.9* 7.4*  PHOS  --   --  5.3* 3.6   CBC  Recent Labs Lab 01/27/13 2157  WBC 6.3  HGB 8.6*  HCT 25.6*  MCV 75.1*  PLT 300    Medications:    . cefTRIAXone (ROCEPHIN)  IV  1 g Intravenous Q24H  . enoxaparin (LOVENOX) injection  120 mg Subcutaneous Q12H  . insulin aspart  0-9 Units Subcutaneous TID WC  . multivitamin with minerals  1 tablet Oral Daily  . pantoprazole  40 mg Oral Daily  . potassium chloride  40 mEq Oral BID  . simvastatin  10 mg Oral QHS  . tacrolimus  1 mg Oral BID  . 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 Ulice Dash at [facility] nephrology, HTN, DM II, hyperlipidemia who presented to the hospital due to chest pain/syncope/dyspnea.   1) FSGS, nephrotic syndrome, now with AKI- Followed at [facility] by Dr. Ulice Dash. Records show recent creatinine has trended up to 1.44 and was increased on tacrolimus to 1 mg BID on 1/15. Cr rising.   UA showing moderate Hgb from 01/29/13.  2) Dyspnea - PE r/o, per primary. May be related to tacrolimus toxicity.  Now febrile, UCx, BCx, Respiratory cultures, Flu panel collected and pending.  On Rocephin/Azithromycin for presumed CAP 3) HTN - Hold lisinopril in setting of worsening renal failure and stable BP  4) Hyperlipidemia - On statin  5) DM - Hold metformin, SSI per primary  6) Anemia microcytic 7) Hypoalbuminemia secondary to Nephrotic syndrome   Plan: FSGS, nephrotic syndrome 1) Creatinine rising (1.8 >> 2.5>>2.6).  Volume depletion vs concern for Renal vein thrombosis vs drug toxicity 2) Continue holding Lisinopril, Metolazone and Torsemide in setting of rising creatinine.   3) MRA ordered to evaluate for Renal vein thrombosis.  4) Tacrolimus level in process 5) Patient now on anticoagulation with Lovenox; will need long term anticoagulation if no improvement in albumin 6) Daily renal panel  7) Will discuss case with her primary nephrologist, Dr. Ulice Dash, from [facility].  Pt would like to be transferred if possible, and will discuss with her nephrologist to try and arrange this.   Nolon Rod, DO PGY-2, [facility] 01/30/2013, 7:47 AM  I have seen and examined this patient and agree with plan and recommendations as outlined in the note above. On systemic anticoagulation. MR to evaluate for RVT still not done.  On empiric antibiotics for fever with source unclear at this time.  Pt has expressed interest in transfer to [facility] and Dr. Ulice Dash has accepted, but transfer may take 1-2 days, so current plans remain in place.  Creatinine 2.5-2.6 for past couple of days. Patricia Corliss B,MD 01/30/2013 12:19 PM

## 2013-01-30 NOTE — Discharge Summary (Signed)
Physician Discharge Summary  Patricia [name] IFO:[id number] DOB: 26-Sep-1975 DOA: 01/27/2013  PCP: Tereasa Coop, PA-C  Admit date: 01/27/2013 Discharge date: 01/30/2013  Time spent: 35 minutes  Recommendations for Outpatient Follow-up:  1. Plan to transfer patient to [facility].  2. Please follow Blood culture, urine culture results.   Discharge Diagnoses:    Acute on chronic renal failure.    Glomerulonephritis   Chest pain   Shortness of breath   HTN (hypertension)   DM (diabetes mellitus)   Hypercholesterolemia     Acute renal failure   Syncope   Diet recommendation: Renal diet.   Filed Weights   01/27/13 2108 01/28/13 0108  Weight: 117.482 kg (259 lb) 118.162 kg (260 lb 8 oz)    History of present illness:  Patricia [name] is a 38 y.o. female, with known history of glomerulonephritis, been followed at [facility] nephrology, patient is on immunosuppressive medication with Prograft, patient supposed to be started on further immunosuppression next week at [facility], patient presents with complaints of syncope today, and chest pain and shortness of breath, patient reports she was in the church, she started to feel warm, dry with chest pain left-sided radiating to the left arm, sharp in quality, as well developed shortness of breath, and she had an episode of loss of consciousness, patient currently denies any chest pain or shortness of breath, patient had negative troponin, no EKG changes, but had elevated D. dimers, but unfortunately she was in acute renal failure with a creatinine of 1.57, so she could not have CT chest with IV contrast, so hospitalist requested to admit the patient, until she is able to get VQ scan in the morning, meanwhile she was started on anticoagulation with heparin.   Hospital Course:  Patient presents with Chest pain and dyspnea. She also presented after a syncope episode. She had VQ scan with  No segmental ventilatory or perfusion defect to suggest pulmonary embolism. ECHO  with normal Ef. Troponin 0.3 negative. Her cr on admission was 1.5. On admission she was started on IV fluids. Nephrologist was consulted to help with management. She was started on her diuretics. Cr was at 1.8. On 1-25 her cr increase to 2.5. At that time diuretics were discontinue. Prograf level is pending. Patient spike fever at 101 on 1-26. She was started on Zosyn to cover for infection. Urine culture, blood culture, chest x ray ordered. Plan is to transfer patient to [facility] where she gets her care.  Patient was on Lovenox therapeutic dose in setting of hypoalbuminemia, nephrotic syndrome, concern for renal thrombosis. Due to worsening renal function this will be change to heparin.   Patient still complaining of flank pain. Feels weak. Relates some cough. No worsening dyspnea.   1-Nephrotic Syndrome, FSGS; renal consulted to help with management and adjust medication. Cr at 1.5 on admission.  -Hypoalbuminemia and increase risk for Thrombosis, on therapeutic dose Lovenox. Marland Kitchen Appreciate renal help. tacrolimus level pending.  -Worsening renal function. ACE and diuretics on hold. Cr increase to 2.5.  -MRA order to rule out renal vein thrombosis. Patient complaining of flank pain.   2-Chest pain, dyspnea: elevated D dimer at 0.97. V-Q scan no evidence of pulmonary embolism. lower extremities doppler: Bilateral: No evidence of DVT, superficial thrombosis, or Baker's Cyst. ECHO: normal EF.  3-Syncope; in setting of dehydration. monitor on telemetry. VQ scan negative for PE.  4-Hypercholesterolemia. Continue with statin  5-Hypertension. Holding lisinopril.  6-Diabetes mellitus. We will hold metformin due to her renal failure.  insulin sliding  scale. Patient didn't received metformin during this admission.  7-Nausea: Lipase 37, normal LFT.  8-Anemia; of chronic diseases. Hb stable at 8.  9-Fever: work up for infection in process. Start Zosyn. Follow chest x ray.   Procedures: ECHO;Study data: Technically  adequate study. - Left ventricle: The cavity size was normal. Wall thickness was normal. Systolic function was normal. The estimated ejection fraction was in the range of 60% to 65%. Wall motion was normal; there were no regional wall motion abnormalities. Left ventricular diastolic function parameters were normal. - Aortic valve: Valve area: 3.17cm^2(VTI). Valve area: 2.64cm^2 (Vmax). - Left atrium: The atrium was mildly dilated.   Doppler lower extremities:negative for DVT  V-Q scan: No segmental ventilatory or perfusion defect to suggest pulmonary  embolism. Given the presence of low lung volumes on the prior exam,  inhomogeneity to the ventilatory radiotracer appearance could  reflect atelectasis/hypoaeration.    Consultations:  Dr Deterdening, Dr Lorrene Reid, nephrologist.   Discharge Exam: Filed Vitals:   01/30/13 0600  BP: 104/51  Pulse: 86  Temp: 100.9 F (38.3 C)  Resp: 18    General: No distress.  Cardiovascular: S 1, S 2 RRR Respiratory: decreases breath sound, few crackles.   Discharge Instructions  Home medication List.    Medication List    ASK your doctor about these medications       lisinopril 40 MG tablet  Commonly known as:  PRINIVIL,ZESTRIL  Take 40 mg by mouth daily.     LORazepam 0.5 MG tablet  Commonly known as:  ATIVAN  Take 0.5 mg by mouth every 6 (six) hours as needed. For anxiety     metFORMIN 500 MG tablet  Commonly known as:  GLUCOPHAGE  Take 500 mg by mouth every evening.     metolazone 5 MG tablet  Commonly known as:  ZAROXOLYN  Take 5 mg by mouth daily.     multivitamin with minerals Tabs tablet  Take 1 tablet by mouth daily.     omeprazole 40 MG capsule  Commonly known as:  PRILOSEC  Take 40 mg by mouth daily.     potassium chloride 10 MEQ tablet  Commonly known as:  K-DUR,KLOR-CON  Take 40 mEq by mouth 2 (two) times daily.     simvastatin 10 MG tablet  Commonly known as:  ZOCOR  Take 10 mg by mouth at bedtime.      tacrolimus 0.5 MG capsule  Commonly known as:  PROGRAF  Take 1 mg by mouth 2 (two) times daily.     torsemide 20 MG tablet  Commonly known as:  DEMADEX  Take 20 mg by mouth 2 (two) times daily.     vitamin C 500 MG tablet  Commonly known as:  ASCORBIC ACID  Take 500 mg by mouth daily.       Allergies  Allergen Reactions  . Nsaids     Kidney disease      The results of significant diagnostics from this hospitalization (including imaging, microbiology, ancillary and laboratory) are listed below for reference.    Significant Diagnostic Studies: Nm Pulmonary Perf And Vent  01/28/2013   CLINICAL DATA:  Chest pain and shortness of breath.  EXAM: NUCLEAR MEDICINE VENTILATION - PERFUSION LUNG SCAN  TECHNIQUE: Ventilation images were obtained in multiple projections using inhaled aerosol technetium 99 M DTPA. Perfusion images were obtained in multiple projections after intravenous injection of Tc-107m MAA.  COMPARISON:  Chest radiograph 01/27/2013  RADIOPHARMACEUTICALS:  Forty mCi Tc-54m DTPA aerosol and 6  mCi Tc-28m MAA  FINDINGS: Ventilation: There is inhomogeneous ventilation without segmental ventilatory defect.  Perfusion: No wedge shaped peripheral perfusion defects to suggest acute pulmonary embolism. Mildly inhomogeneous perfusion is identified.  IMPRESSION: No segmental ventilatory or perfusion defect to suggest pulmonary embolism. Given the presence of low lung volumes on the prior exam, inhomogeneity to the ventilatory radiotracer appearance could reflect atelectasis/hypoaeration.   Electronically Signed   By: Conchita Paris M.D.   On: 01/28/2013 10:02   Dg Chest Port 1 View  01/27/2013   CLINICAL DATA:  Chest pain  EXAM: PORTABLE CHEST - 1 VIEW  COMPARISON:  None.  FINDINGS: The heart size and mediastinal contours are within normal limits. Both lungs are clear. The visualized skeletal structures are unremarkable.  IMPRESSION: No active disease.   Electronically Signed   By: Kerby Moors M.D.   On: 01/27/2013 21:31    Microbiology: No results found for this or any previous visit (from the past 240 hour(s)).   Labs: Basic Metabolic Panel:  Recent Labs Lab 01/27/13 2157 01/28/13 1130 01/29/13 0435 01/30/13 0100  NA 140 140 138 136*  K 3.8 3.7 3.8 4.4  CL 107 107 105 105  CO2 $Re'22 21 23 20  'eeO$ GLUCOSE 105* 102* 98 101*  BUN 45* 41* 41* 42*  CREATININE 1.57* 1.80* 2.50* 2.60*  CALCIUM 7.9* 7.8* 7.9* 7.4*  PHOS  --   --  5.3* 3.6   Liver Function Tests:  Recent Labs Lab 01/29/13 0435 01/30/13 0100  AST 15 14  ALT 11 10  ALKPHOS 34* 37*  BILITOT <0.2* <0.2*  PROT 4.2* 4.1*  ALBUMIN 1.1*  1.1* 0.9*    Recent Labs Lab 01/28/13 1130  LIPASE 37   No results found for this basename: AMMONIA,  in the last 168 hours CBC:  Recent Labs Lab 01/27/13 2157 01/30/13 1340  WBC 6.3 4.2  HGB 8.6* 8.3*  HCT 25.6* 25.3*  MCV 75.1* 76.7*  PLT 300 226   Cardiac Enzymes:  Recent Labs Lab 01/28/13 0225 01/28/13 0716 01/28/13 1130  TROPONINI <0.30 <0.30 <0.30   BNP: BNP (last 3 results)  Recent Labs  04/25/12 1243 05/22/12 2307 01/27/13 2157  PROBNP 269.3* 40.7 262.9*   CBG:  Recent Labs Lab 01/29/13 1214 01/29/13 1756 01/29/13 2143 01/30/13 0730 01/30/13 1147  GLUCAP 87 91 118* 92 97       Signed:  Coleby Yett  [facility] 01/30/2013, 2:20 PM

## 2013-01-31 DIAGNOSIS — R509 Fever, unspecified: Secondary | ICD-10-CM

## 2013-01-31 LAB — CBC
HCT: 24.9 % — ABNORMAL LOW (ref 36.0–46.0)
Hemoglobin: 8.2 g/dL — ABNORMAL LOW (ref 12.0–15.0)
MCH: 25 pg — ABNORMAL LOW (ref 26.0–34.0)
MCHC: 32.9 g/dL (ref 30.0–36.0)
MCV: 75.9 fL — ABNORMAL LOW (ref 78.0–100.0)
PLATELETS: 209 10*3/uL (ref 150–400)
RBC: 3.28 MIL/uL — ABNORMAL LOW (ref 3.87–5.11)
RDW: 14.1 % (ref 11.5–15.5)
WBC: 4.1 10*3/uL (ref 4.0–10.5)

## 2013-01-31 LAB — RESPIRATORY VIRUS PANEL
Adenovirus: NOT DETECTED
INFLUENZA A H1: NOT DETECTED
INFLUENZA A: NOT DETECTED
Influenza A H3: NOT DETECTED
Influenza B: NOT DETECTED
METAPNEUMOVIRUS: NOT DETECTED
Parainfluenza 1: NOT DETECTED
Parainfluenza 2: NOT DETECTED
Parainfluenza 3: NOT DETECTED
RESPIRATORY SYNCYTIAL VIRUS A: NOT DETECTED
Respiratory Syncytial Virus B: NOT DETECTED
Rhinovirus: NOT DETECTED

## 2013-01-31 LAB — RENAL FUNCTION PANEL
ALBUMIN: 0.9 g/dL — AB (ref 3.5–5.2)
BUN: 41 mg/dL — ABNORMAL HIGH (ref 6–23)
CALCIUM: 7.4 mg/dL — AB (ref 8.4–10.5)
CO2: 22 mEq/L (ref 19–32)
Chloride: 104 mEq/L (ref 96–112)
Creatinine, Ser: 2.23 mg/dL — ABNORMAL HIGH (ref 0.50–1.10)
GFR calc Af Amer: 31 mL/min — ABNORMAL LOW (ref >90)
GFR, EST NON AFRICAN AMERICAN: 27 mL/min — AB (ref >90)
Glucose, Bld: 98 mg/dL (ref 70–99)
PHOSPHORUS: 3.8 mg/dL (ref 2.3–4.6)
POTASSIUM: 4.8 meq/L (ref 3.7–5.3)
SODIUM: 135 meq/L — AB (ref 137–147)

## 2013-01-31 LAB — HEPARIN LEVEL (UNFRACTIONATED)
HEPARIN UNFRACTIONATED: 1.28 [IU]/mL — AB (ref 0.30–0.70)
HEPARIN UNFRACTIONATED: 0.93 [IU]/mL — AB (ref 0.30–0.70)
Heparin Unfractionated: 0.55 IU/mL (ref 0.30–0.70)

## 2013-01-31 LAB — GLUCOSE, CAPILLARY
GLUCOSE-CAPILLARY: 104 mg/dL — AB (ref 70–99)
GLUCOSE-CAPILLARY: 84 mg/dL (ref 70–99)
GLUCOSE-CAPILLARY: 96 mg/dL (ref 70–99)
GLUCOSE-CAPILLARY: 96 mg/dL (ref 70–99)

## 2013-01-31 LAB — TACROLIMUS LEVEL: Tacrolimus (FK506) - LabCorp: NOT DETECTED ng/mL

## 2013-01-31 MED ORDER — TORSEMIDE 20 MG PO TABS: 20.0000 mg | ORAL_TABLET | Freq: Every day | ORAL | Status: DC

## 2013-01-31 MED ORDER — PROMETHAZINE HCL 12.5 MG PO TABS
12.5000 mg | ORAL_TABLET | Freq: Four times a day (QID) | ORAL | Status: DC | PRN
  Administered 2013-01-31 – 2013-02-01 (×2): 12.5 mg via ORAL
  Filled 2013-01-31 (×2): qty 1

## 2013-01-31 MED ORDER — TORSEMIDE 20 MG PO TABS
20.0000 mg | ORAL_TABLET | Freq: Two times a day (BID) | ORAL | Status: DC
  Administered 2013-01-31 – 2013-02-02 (×5): 20 mg via ORAL
  Filled 2013-01-31 (×7): qty 1

## 2013-01-31 MED ORDER — HEPARIN (PORCINE) IN NACL 100-0.45 UNIT/ML-% IJ SOLN
1350.0000 [IU]/h | INTRAMUSCULAR | Status: DC
  Administered 2013-01-31 (×2): 1350 [IU]/h via INTRAVENOUS
  Administered 2013-02-01 – 2013-02-02 (×2): 1150 [IU]/h via INTRAVENOUS
  Filled 2013-01-31 (×5): qty 250

## 2013-01-31 MED ORDER — ONDANSETRON HCL 4 MG PO TABS
4.0000 mg | ORAL_TABLET | Freq: Three times a day (TID) | ORAL | Status: DC | PRN
  Filled 2013-01-31: qty 1

## 2013-01-31 MED ORDER — INSULIN ASPART 100 UNIT/ML ~~LOC~~ SOLN
0.0000 [IU] | Freq: Two times a day (BID) | SUBCUTANEOUS | Status: DC
Start: 2013-01-31 — End: 2013-02-02

## 2013-01-31 MED ORDER — GUAIFENESIN ER 600 MG PO TB12
600.0000 mg | ORAL_TABLET | Freq: Two times a day (BID) | ORAL | Status: DC
  Administered 2013-01-31: 600 mg via ORAL
  Filled 2013-01-31 (×5): qty 1

## 2013-01-31 NOTE — Plan of Care (Signed)
Problem: Phase III Progression Outcomes Goal: Discharge plan remains appropriate-arrangements made Outcome: Progressing Awaiting to transfer to [facility]

## 2013-01-31 NOTE — Progress Notes (Signed)
[facility] Progress Note   Subjective:   Pt feeling slightly better than yesterday, still having L CVA tenderness.    Objective:   BP 123/65  Pulse 69  Temp(Src) 99.5 F (37.5 C) (Oral)  Resp 18  Ht $R'5\' 7"'uT$  (1.702 m)  Wt 260 lb 8 oz (118.162 kg)  BMI 40.79 kg/m2  SpO2 100%  Intake/Output Summary (Last 24 hours) at 01/31/13 0736 Last data filed at 01/30/13 1921  Gross per 24 hour  Intake   1090 ml  Output      0 ml  Net   1090 ml   Physical Exam: General: NAD.  Cardiovascular: RRR. No murmur appreciated.  Respiratory: CTAB.  Abdomen: soft, mild lower abdominal tenderness. Nondistended. +CVA tenderness on left.  Extremities: Trace LE edema.  Neuro: No focal deficits.  Labs: BMET  Recent Labs Lab 01/27/13 2157 01/28/13 1130 01/29/13 0435 01/30/13 0100 01/31/13 0525  NA 140 140 138 136* 135*  K 3.8 3.7 3.8 4.4 4.8  CL 107 107 105 105 104  CO2 $Re'22 21 23 20 22  'WTV$ GLUCOSE 105* 102* 98 101* 98  BUN 45* 41* 41* 42* 41*  CREATININE 1.57* 1.80* 2.50* 2.60* 2.23*  CALCIUM 7.9* 7.8* 7.9* 7.4* 7.4*  PHOS  --   --  5.3* 3.6 3.8   CBC  Recent Labs Lab 01/27/13 2157 01/30/13 1340 01/31/13 0529  WBC 6.3 4.2 4.1  HGB 8.6* 8.3* 8.2*  HCT 25.6* 25.3* 24.9*  MCV 75.1* 76.7* 75.9*  PLT 300 226 209    Medications:    . insulin aspart  0-9 Units Subcutaneous TID WC  . multivitamin with minerals  1 tablet Oral Daily  . pantoprazole  40 mg Oral Daily  . piperacillin-tazobactam (ZOSYN)  IV  3.375 g Intravenous Q8H  . polyethylene glycol  17 g Oral BID  . potassium chloride  40 mEq Oral BID  . simvastatin  10 mg Oral QHS  . tacrolimus  1 mg Oral BID  . 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 Ulice Dash at [facility] nephrology, HTN, DM II, hyperlipidemia who presented to the hospital due to chest pain/syncope/dyspnea.   1) FSGS, nephrotic syndrome, now with AKI-  Followed at [facility] by Dr. Ulice Dash. Records show recent creatinine has trended up to 1.44 and was increased on tacrolimus to 1 mg BID on 1/15.  2) Dyspnea - PE r/o, per primary. Now febrile, UCx, BCx, Respiratory cultures, Flu panel negative.  On Vanc/Zosyn per primary  3) HTN - Hold lisinopril in setting of renal failure and stable BP  4) Hyperlipidemia - On statin  5) DM - Hold metformin, SSI per primary  6) Anemia microcytic 7) Hypoalbuminemia secondary to Nephrotic syndrome  8) Hyponatremia secondary to nephrotic syndrome   Plan: FSGS, nephrotic syndrome  1) Creatinine plateau and decrease(1.8 >> 2.5>>2.6>>2.2).  Creatinine increase on adm was worrisome for volume depletion vs concern for renal vein thrombosis vs drug toxicity vs worsening FSGS - creatinine is falling now;  MRA no evidence for RVT; With development of worsening edema, will restart demadex.  Continue to hold ACE.   2) Continue holding Lisinopril, Metolazone.  Will restart Demadex today w/ free H2O restriction  3) MRA abdomen pending for renal vein thrombosis evaluation No evidence for RVT 4) Tacrolimus level in process 5) Patient now on anticoagulation with Lovenox secondary to  decreased albumin.  6) Daily renal panel  7) Discussed case with Dr. Ulice Dash, primary Nephrologist at [facility].  Will transfer when bed available, pt preference.   Patricia Rod, DO PGY-2, [facility] 01/31/2013, 7:36 AM  I have seen and examined this patient and agree with plan as above with highlighted additions.  Creatinine peaked at 2.6 - down to 2.23 today.MR study negative for RVT.   Incr edema -> restart demadex. Continue to hold metolazone and ACEi.  Fever source unclear. BC's neg to date. No UC sent (UA no pyuria or bacteruria). On empiric vanco/zosyn.  Awaiting transfer to [facility] at patient request.   Patricia [name] B,MD 01/31/2013 11:48 AM

## 2013-01-31 NOTE — Progress Notes (Signed)
ANTICOAGULATION CONSULT NOTE - Follow Up Consult  Pharmacy Consult for heparin Indication: nephrotic syndrome-- prevention of thrombosis  Allergies  Allergen Reactions  . Nsaids     Kidney disease   Patient Measurements: Height: 5\' 7"  (170.2 cm) Weight: 260 lb 8 oz (118.162 kg) IBW/kg (Calculated) : 61.6 Heparin Dosing Weight: 89 kg  Vital Signs: Temp: 99.2 F (37.3 C) (01/27 2103) Temp src: Oral (01/27 2103) BP: 113/65 mmHg (01/27 2103) Pulse Rate: 74 (01/27 2103)  Labs:  Recent Labs  01/29/13 0435 01/30/13 0100 01/30/13 1340 01/30/13 2320 01/31/13 0525 01/31/13 0529 01/31/13 1135 01/31/13 2030  HGB  --   --  8.3*  --   --  8.2*  --   --   HCT  --   --  25.3*  --   --  24.9*  --   --   PLT  --   --  226  --   --  209  --   --   HEPARINUNFRC  --   --   --  1.28*  --   --  0.93* 0.55  CREATININE 2.50* 2.60*  --   --  2.23*  --   --   --    Estimated Creatinine Clearance: 45 ml/min (by C-G formula based on Cr of 2.23).  Assessment: Patient is a 38 y.o F on anticoagulation for nephrotic syndrome with plan to transfer to [facility] soon.  Scr down to 2.23 today and albumin remains low at 0.9.  Heparin level is now within desired goal range after rate decrease.  No bleeding documented.  Goal of Therapy:  Heparin level 0.3-0.7 units/ml Monitor platelets by anticoagulation protocol: Yes   Plan:  1)  Will continue IV heparin drip at 1150 units/hr 2)  F/U AM heparin level and CBC  Rober Minion, PharmD., MS Clinical Pharmacist Pager:  [phone] Thank you for allowing pharmacy to be part of this patients care team. 01/31/2013,9:49 PM

## 2013-01-31 NOTE — Discharge Summary (Signed)
Physician Discharge Summary  Patricia [name] CEE:[id number] DOB: [birth year] DOA: 01/27/2013  PCP: Loyce Dys, PA-C  Admit date: 01/27/2013 Discharge date: 01/31/2013  Time spent: 35 minutes  Recommendations for Outpatient Follow-up:  1. Plan to transfer patient to [facility].  2. Please follow Blood culture, urine culture results.   Discharge Diagnoses:    Acute on chronic renal failure.    Glomerulonephritis   Chest pain   Shortness of breath   HTN (hypertension)   DM (diabetes mellitus)   Hypercholesterolemia   Acute renal failure   Syncope   Diet recommendation: Renal diet.   Filed Weights   01/27/13 2108 01/28/13 0108  Weight: 117.482 kg (259 lb) 118.162 kg (260 lb 8 oz)    History of present illness:  Patricia [name] is a 38 y.o. female, with known history of glomerulonephritis, been followed at [facility] nephrology, patient is on immunosuppressive medication with Prograft, patient supposed to be started on further immunosuppression next week at [facility], patient presents with complaints of syncope today, and chest pain and shortness of breath, patient reports she was in the church, she started to feel warm, dry with chest pain left-sided radiating to the left arm, sharp in quality, as well developed shortness of breath, and she had an episode of loss of consciousness, patient currently denies any chest pain or shortness of breath, patient had negative troponin, no EKG changes, but had elevated D. dimers, but unfortunately she was in acute renal failure with a creatinine of 1.57, so she could not have CT chest with IV contrast, so hospitalist requested to admit the patient, until she is able to get VQ scan in the morning, meanwhile she was started on anticoagulation with heparin.   Hospital Course:  Patient presents with Chest pain and dyspnea. She also presented after a syncope episode. She had VQ scan with  No segmental ventilatory or perfusion defect to suggest pulmonary embolism. ECHO with  normal Ef. Troponin 0.3 negative. Her cr on admission was 1.5. On admission she was started on IV fluids. Nephrologist was consulted to help with management. She was started on her diuretics. Cr was at 1.8. On 1-25 her cr increase to 2.5. At that time diuretics were discontinue. Prograf level is pending. Patient spike fever at 101 on 1-26. She was started on Zosyn to cover for infection. Urine culture, blood culture, chest x ray ordered. Plan is to transfer patient to [facility] where she gets her care.  Patient was on Lovenox therapeutic dose in setting of hypoalbuminemia, nephrotic syndrome, concern for renal thrombosis. Due to worsening renal function this will be change to heparin.   Feeling better today that yesterday. Still with flank pain. Had a BM.   1-Nephrotic Syndrome, FSGS; renal consulted to help with management and adjust medication. Cr at 1.5 on admission.  -Hypoalbuminemia and increase risk for Thrombosis, on therapeutic dose Lovenox. Marland Kitchen Appreciate renal help. tacrolimus level pending.  -Worsening renal function. ACE  on hold.   -MRA order to rule out renal vein thrombosis, was negative.  -Cr decrease to 2.2. Torsemide resume 1-27.   2-Chest pain, dyspnea: elevated D dimer at 0.97. V-Q scan no evidence of pulmonary embolism. lower extremities doppler: Bilateral: No evidence of DVT, superficial thrombosis, or Baker's Cyst. ECHO: normal EF.  3-Syncope; in setting of dehydration. monitor on telemetry. VQ scan negative for PE.  4-Hypercholesterolemia. Continue with statin  5-Hypertension. Holding lisinopril.  6-Diabetes mellitus. We will hold metformin due to her renal failure.  insulin sliding scale. Patient didn't  received metformin during this admission.  7-Nausea: Lipase 37, normal LFT.  8-Anemia; of chronic diseases. Hb stable at 8.  9-Fever: work up for infection in process. Continue with  Zosyn day 2. Chest x ray negative. Blood culture pending. Urine culture re send for culture.  Respiratory viral panel in process. Influenza negative. If fever continue with need ID consult.   Procedures: ECHO;Study data: Technically adequate study. - Left ventricle: The cavity size was normal. Wall thickness was normal. Systolic function was normal. The estimated ejection fraction was in the range of 60% to 65%. Wall motion was normal; there were no regional wall motion abnormalities. Left ventricular diastolic function parameters were normal. - Aortic valve: Valve area: 3.17cm^2(VTI). Valve area: 2.64cm^2 (Vmax). - Left atrium: The atrium was mildly dilated.   Doppler lower extremities:negative for DVT  V-Q scan: No segmental ventilatory or perfusion defect to suggest pulmonary  embolism. Given the presence of low lung volumes on the prior exam,  inhomogeneity to the ventilatory radiotracer appearance could  reflect atelectasis/hypoaeration.    Consultations:  Dr Deterdening, Dr Eliott Nine, nephrologist.   Discharge Exam: Filed Vitals:   01/31/13 1637  BP:   Pulse:   Temp: 98.3 F (36.8 C)  Resp:     General: No distress.  Cardiovascular: S 1, S 2 RRR Respiratory: decreases breath sound, few crackles.   Discharge Instructions  Home medication List.    Medication List    ASK your doctor about these medications       lisinopril 40 MG tablet  Commonly known as:  PRINIVIL,ZESTRIL  Take 40 mg by mouth daily.     LORazepam 0.5 MG tablet  Commonly known as:  ATIVAN  Take 0.5 mg by mouth every 6 (six) hours as needed. For anxiety     metFORMIN 500 MG tablet  Commonly known as:  GLUCOPHAGE  Take 500 mg by mouth every evening.     metolazone 5 MG tablet  Commonly known as:  ZAROXOLYN  Take 5 mg by mouth daily.     multivitamin with minerals Tabs tablet  Take 1 tablet by mouth daily.     omeprazole 40 MG capsule  Commonly known as:  PRILOSEC  Take 40 mg by mouth daily.     potassium chloride 10 MEQ tablet  Commonly known as:  K-DUR,KLOR-CON  Take 40  mEq by mouth 2 (two) times daily.     simvastatin 10 MG tablet  Commonly known as:  ZOCOR  Take 10 mg by mouth at bedtime.     tacrolimus 0.5 MG capsule  Commonly known as:  PROGRAF  Take 1 mg by mouth 2 (two) times daily.     torsemide 20 MG tablet  Commonly known as:  DEMADEX  Take 20 mg by mouth 2 (two) times daily.     vitamin C 500 MG tablet  Commonly known as:  ASCORBIC ACID  Take 500 mg by mouth daily.       Allergies  Allergen Reactions  . Nsaids     Kidney disease      The results of significant diagnostics from this hospitalization (including imaging, microbiology, ancillary and laboratory) are listed below for reference.    Significant Diagnostic Studies: Nm Pulmonary Perf And Vent  01/28/2013   CLINICAL DATA:  Chest pain and shortness of breath.  EXAM: NUCLEAR MEDICINE VENTILATION - PERFUSION LUNG SCAN  TECHNIQUE: Ventilation images were obtained in multiple projections using inhaled aerosol technetium 99 M DTPA. Perfusion images were obtained in  multiple projections after intravenous injection of Tc-73m MAA.  COMPARISON:  Chest radiograph 01/27/2013  RADIOPHARMACEUTICALS:  Forty mCi Tc-51m DTPA aerosol and 6 mCi Tc-64m MAA  FINDINGS: Ventilation: There is inhomogeneous ventilation without segmental ventilatory defect.  Perfusion: No wedge shaped peripheral perfusion defects to suggest acute pulmonary embolism. Mildly inhomogeneous perfusion is identified.  IMPRESSION: No segmental ventilatory or perfusion defect to suggest pulmonary embolism. Given the presence of low lung volumes on the prior exam, inhomogeneity to the ventilatory radiotracer appearance could reflect atelectasis/hypoaeration.   Electronically Signed   By: Conchita Paris M.D.   On: 01/28/2013 10:02   Dg Chest Port 1 View  01/27/2013   CLINICAL DATA:  Chest pain  EXAM: PORTABLE CHEST - 1 VIEW  COMPARISON:  None.  FINDINGS: The heart size and mediastinal contours are within normal limits. Both lungs are  clear. The visualized skeletal structures are unremarkable.  IMPRESSION: No active disease.   Electronically Signed   By: Kerby Moors M.D.   On: 01/27/2013 21:31    Microbiology: Recent Results (from the past 240 hour(s))  CULTURE, BLOOD (ROUTINE X 2)     Status: None   Collection Time    01/30/13 12:55 AM      Result Value Range Status   Specimen Description BLOOD LEFT ARM   Final   Special Requests BOTTLES DRAWN AEROBIC AND ANAEROBIC 5CC EA   Final   Culture  Setup Time     Final   Value: 01/30/2013 08:13     Performed at [facility]   Culture     Final   Value:        BLOOD CULTURE RECEIVED NO GROWTH TO DATE CULTURE WILL BE HELD FOR 5 DAYS BEFORE ISSUING A FINAL NEGATIVE REPORT     Performed at [facility]   Report Status PENDING   Incomplete  CULTURE, BLOOD (ROUTINE X 2)     Status: None   Collection Time    01/30/13  1:00 AM      Result Value Range Status   Specimen Description BLOOD RIGHT ANTECUBITAL   Final   Special Requests BOTTLES DRAWN AEROBIC ONLY [facility]   Final   Culture  Setup Time     Final   Value: 01/30/2013 08:14     Performed at [facility]   Culture     Final   Value:        BLOOD CULTURE RECEIVED NO GROWTH TO DATE CULTURE WILL BE HELD FOR 5 DAYS BEFORE ISSUING A FINAL NEGATIVE REPORT     Performed at [facility]   Report Status PENDING   Incomplete  URINE CULTURE     Status: None   Collection Time    01/30/13  6:18 AM      Result Value Range Status   Specimen Description URINE, CLEAN CATCH   Final   Special Requests NONE   Final   Culture  Setup Time     Final   Value: 01/30/2013 07:08     Performed at [place] PENDING   Incomplete   Culture     Final   Value: Culture reincubated for better growth     Performed at [facility]   Report Status PENDING   Incomplete     Labs: Basic Metabolic Panel:  Recent Labs Lab 01/27/13 2157 01/28/13 1130 01/29/13 0435 01/30/13 0100  01/31/13 0525  NA 140 140 138 136* 135*  K  3.8 3.7 3.8 4.4 4.8  CL 107 107 105 105 104  CO2 $Re'22 21 23 20 22  'DRU$ GLUCOSE 105* 102* 98 101* 98  BUN 45* 41* 41* 42* 41*  CREATININE 1.57* 1.80* 2.50* 2.60* 2.23*  CALCIUM 7.9* 7.8* 7.9* 7.4* 7.4*  PHOS  --   --  5.3* 3.6 3.8   Liver Function Tests:  Recent Labs Lab 01/29/13 0435 01/30/13 0100 01/31/13 0525  AST 15 14  --   ALT 11 10  --   ALKPHOS 34* 37*  --   BILITOT <0.2* <0.2*  --   PROT 4.2* 4.1*  --   ALBUMIN 1.1*  1.1* 0.9* 0.9*    Recent Labs Lab 01/28/13 1130  LIPASE 37   No results found for this basename: AMMONIA,  in the last 168 hours CBC:  Recent Labs Lab 01/27/13 2157 01/30/13 1340 01/31/13 0529  WBC 6.3 4.2 4.1  HGB 8.6* 8.3* 8.2*  HCT 25.6* 25.3* 24.9*  MCV 75.1* 76.7* 75.9*  PLT 300 226 209   Cardiac Enzymes:  Recent Labs Lab 01/28/13 0225 01/28/13 0716 01/28/13 1130  TROPONINI <0.30 <0.30 <0.30   BNP: BNP (last 3 results)  Recent Labs  04/25/12 1243 05/22/12 2307 01/27/13 2157  PROBNP 269.3* 40.7 262.9*   CBG:  Recent Labs Lab 01/30/13 1702 01/31/13 0026 01/31/13 0750 01/31/13 1141 01/31/13 1648  GLUCAP 79 96 96 104* 84       Signed:  Emannuel Vise  [facility] 01/31/2013, 5:25 PM

## 2013-01-31 NOTE — Progress Notes (Signed)
ANTICOAGULATION CONSULT NOTE - Follow Up Consult  Pharmacy Consult for heparin Indication: nephrotic syndrome-- prevention of thrombosis  Allergies  Allergen Reactions  . Nsaids     Kidney disease    Patient Measurements: Height: 5\' 7"  (170.2 cm) Weight: 260 lb 8 oz (118.162 kg) IBW/kg (Calculated) : 61.6 Heparin Dosing Weight: 89 kg  Vital Signs: Temp: 99.5 F (37.5 C) (01/27 0612) Temp src: Oral (01/27 0612) BP: 123/65 mmHg (01/27 0612) Pulse Rate: 69 (01/27 0612)  Labs:  Recent Labs  01/29/13 0435 01/30/13 0100 01/30/13 1340 01/30/13 2320 01/31/13 0525 01/31/13 0529 01/31/13 1135  HGB  --   --  8.3*  --   --  8.2*  --   HCT  --   --  25.3*  --   --  24.9*  --   PLT  --   --  226  --   --  209  --   HEPARINUNFRC  --   --   --  1.28*  --   --  0.93*  CREATININE 2.50* 2.60*  --   --  2.23*  --   --     Estimated Creatinine Clearance: 45 ml/min (by C-G formula based on Cr of 2.23).  Assessment: Patient is a 38 y.o F on anticoagulation for nephrotic syndrome with plan to transfer to [facility] soon.  Scr down to 2.23 today and albumin remains low at 0.9.  Heparin level remains elevated despite rate decreased to 1350 units/hr this morning.  No bleeding documented.   Goal of Therapy:  Heparin level 0.3-0.7 units/ml Monitor platelets by anticoagulation protocol: Yes   Plan:  1) decrease heparin drip tp 1150 units/hr 2) check 8 hour heparin level  Colbie Danner P 01/31/2013,1:28 PM

## 2013-01-31 NOTE — Progress Notes (Signed)
[place] for heparin Indication:  Nephrotic syndrome  Allergies  Allergen Reactions  . Nsaids     Kidney disease    Patient Measurements: Height: 5\' 7"  (170.2 cm) Weight: 260 lb 8 oz (118.162 kg) IBW/kg (Calculated) : 61.6 Heparin Dosing Weight: 89 kg  Vital Signs: Temp: 100.2 F (37.9 C) (01/27 0032) Temp src: Oral (01/27 0032) BP: 108/43 mmHg (01/26 2011) Pulse Rate: 80 (01/26 2011)  Labs:  Recent Labs  01/28/13 0225 01/28/13 0716 01/28/13 0900 01/28/13 1130 01/29/13 0435 01/30/13 0100 01/30/13 1340 01/30/13 2320  HGB  --   --   --   --   --   --  8.3*  --   HCT  --   --   --   --   --   --  25.3*  --   PLT  --   --   --   --   --   --  226  --   LABPROT  --   --  13.7  --   --   --   --   --   INR  --   --  1.07  --   --   --   --   --   HEPARINUNFRC  --   --  0.25*  --   --   --   --  1.28*  CREATININE  --   --   --  1.80* 2.50* 2.60*  --   --   TROPONINI <0.30 <0.30  --  <0.30  --   --   --   --     Estimated Creatinine Clearance: 38.6 ml/min (by C-G formula based on Cr of 2.6).  Assessment: 38 y.o F with nephrotic syndrome for anticoagulation.  Converted from Lovenox to heparin this morning.  Renal dysfunction slowing elimination of Lovenox and likely causing elevated heparin level tonight  Goal of Therapy:  Heparin level 0.3-0.7 units/ml Monitor platelets by anticoagulation protocol: Yes   Plan:  Hold heparin x 3 hrs, then restart heparin 1350 units/hr Recheck level 8 hrs after resuming infuson  Yasin Ducat, Bronson Curb 01/31/2013,12:43 AM

## 2013-01-31 NOTE — Care Management Note (Addendum)
    Page 1 of 1   02/02/2013     2:37:21 PM   CARE MANAGEMENT NOTE 02/02/2013  Patient:  Patricia [name],Patricia [name]   Account Number:  [id number]  Date Initiated:  01/31/2013  Documentation initiated by:  GRAVES-BIGELOW,Johari Pinney  Subjective/Objective Assessment:   Pt admitted for complaints of syncope, chest pain and shortness of breath. History of glomerulonephritis.     Action/Plan:   Awaiting tx to [facility]. CM will continue to monitor.   Anticipated DC Date:  01/31/2013   Anticipated DC Plan:  ACUTE TO ACUTE TRANS      DC Planning Services  CM consult      Choice offered to / List presented to:             Status of service:  Completed, signed off Medicare Important Message given?   (If response is "NO", the following Medicare IM given date fields will be blank) Date Medicare IM given:   Date Additional Medicare IM given:    Discharge Disposition:  HOME/SELF CARE  Per UR Regulation:  Reviewed for med. necessity/level of care/duration of stay  If discussed at [place] of Stay Meetings, dates discussed:   02/02/2013    Comments:  02-01-13 1400 Jacqlyn Krauss, RN,BSN [phone] Pt aware of cost of medication.  ENOXAPARIN  120 MG  CO-PAY $70.00 LOVENOX 120 MG  CO-PAY $ 5.00 PRIOR APPROVAL - NO PHARMACY : ** [facility] # [phone]

## 2013-02-01 DIAGNOSIS — R509 Fever, unspecified: Secondary | ICD-10-CM

## 2013-02-01 LAB — RENAL FUNCTION PANEL
Albumin: 0.9 g/dL — ABNORMAL LOW (ref 3.5–5.2)
BUN: 40 mg/dL — ABNORMAL HIGH (ref 6–23)
CALCIUM: 7.8 mg/dL — AB (ref 8.4–10.5)
CO2: 18 meq/L — AB (ref 19–32)
CREATININE: 2.05 mg/dL — AB (ref 0.50–1.10)
Chloride: 107 mEq/L (ref 96–112)
GFR calc non Af Amer: 29 mL/min — ABNORMAL LOW (ref >90)
GFR, EST AFRICAN AMERICAN: 34 mL/min — AB (ref >90)
Glucose, Bld: 96 mg/dL (ref 70–99)
PHOSPHORUS: 4.2 mg/dL (ref 2.3–4.6)
Potassium: 5.3 mEq/L (ref 3.7–5.3)
SODIUM: 138 meq/L (ref 137–147)

## 2013-02-01 LAB — CBC
HCT: 27.8 % — ABNORMAL LOW (ref 36.0–46.0)
HEMOGLOBIN: 9.3 g/dL — AB (ref 12.0–15.0)
MCH: 25.6 pg — AB (ref 26.0–34.0)
MCHC: 33.5 g/dL (ref 30.0–36.0)
MCV: 76.6 fL — ABNORMAL LOW (ref 78.0–100.0)
Platelets: 226 10*3/uL (ref 150–400)
RBC: 3.63 MIL/uL — ABNORMAL LOW (ref 3.87–5.11)
RDW: 14.4 % (ref 11.5–15.5)
WBC: 5.4 10*3/uL (ref 4.0–10.5)

## 2013-02-01 LAB — URINE CULTURE: Colony Count: 15000

## 2013-02-01 LAB — GLUCOSE, CAPILLARY
GLUCOSE-CAPILLARY: 86 mg/dL (ref 70–99)
Glucose-Capillary: 98 mg/dL (ref 70–99)

## 2013-02-01 LAB — HEPARIN LEVEL (UNFRACTIONATED): Heparin Unfractionated: 0.39 IU/mL (ref 0.30–0.70)

## 2013-02-01 MED ORDER — METOLAZONE 5 MG PO TABS
5.0000 mg | ORAL_TABLET | Freq: Every day | ORAL | Status: DC
  Administered 2013-02-01 – 2013-02-02 (×2): 5 mg via ORAL
  Filled 2013-02-01 (×4): qty 1

## 2013-02-01 MED ORDER — SODIUM BICARBONATE 650 MG PO TABS
650.0000 mg | ORAL_TABLET | Freq: Two times a day (BID) | ORAL | Status: DC
  Administered 2013-02-01 – 2013-02-02 (×3): 650 mg via ORAL
  Filled 2013-02-01 (×4): qty 1

## 2013-02-01 NOTE — Progress Notes (Signed)
[facility] PROGRESS NOTE  Ronita Hargreaves XBL:[id number] DOB: [birth year] DOA: 01/27/2013 PCP: Tereasa Coop, PA-C  Assessment/Plan: 1-Nephrotic Syndrome, FSGS; renal consulted to help with management and  Medication adjustment. Cr at 1.5 on admission.  -Hypoalbuminemia and increase risk for Thrombosis, on therapeutic dose Lovenox. Marland Kitchen Appreciate renal help. tacrolimus level pending.  -Worsening renal function. ACE on hold.  -MRA order to rule out renal vein thrombosis, was negative.  -Cr continues to  decrease to 2.05. Torsemide resumed 1-27. Renal has started metolazone 1/28. -Sodium bicarb tablets added for mild metabolic acidosis.  2-Chest pain, dyspnea: elevated D dimer at 0.97. V-Q scan no evidence of pulmonary embolism. lower extremities doppler: Bilateral: No evidence of DVT, superficial thrombosis, or Baker's Cyst. ECHO: normal EF.   3-Syncope; in setting of dehydration. monitor on telemetry. VQ scan low prob for PE.   4-Hypercholesterolemia. Continue with statin   5-Hypertension. Holding lisinopril, given worsening renal function. Well controlled.   6-Diabetes mellitus. We will hold metformin due to her renal failure. insulin sliding scale. Well controlled.  7-Nausea: Lipase 37, normal LFT. Resolved.  8-Anemia; of chronic diseases. Hb stable at 8.   9-Fever: Has been afebrile since 1/26. All cx data is negative to date. Will Dc zosyn and follow fever curve.  Code Status: Full Code Family Communication: Patient only  Disposition Plan: Transfer to [facility] once bed available.   Consultants:  Renal   Antibiotics:  None   Subjective: C/o back pain  Objective: Filed Vitals:   01/31/13 1637 01/31/13 2103 01/31/13 2226 02/01/13 0613  BP:  113/65  147/82  Pulse:  74  83  Temp: 98.3 F (36.8 C) 99.2 F (37.3 C) 99.9 F (37.7 C) 99.3 F (37.4 C)  TempSrc: Oral Oral Oral Oral  Resp:  18  18  Height:      Weight:    117.3 kg (258 lb 9.6 oz)  SpO2:  97%  97%    No intake or output data in the 24 hours ending 02/01/13 1415 Filed Weights   01/27/13 2108 01/28/13 0108 02/01/13 0613  Weight: 117.482 kg (259 lb) 118.162 kg (260 lb 8 oz) 117.3 kg (258 lb 9.6 oz)    Exam:   General:  AA Ox3  Cardiovascular: RRR  Respiratory: CTA B  Abdomen: +BS  Extremities: 2+edema   Neurologic:  Non-focal  Data Reviewed: Basic Metabolic Panel:  Recent Labs Lab 01/28/13 1130 01/29/13 0435 01/30/13 0100 01/31/13 0525 02/01/13 0410  NA 140 138 136* 135* 138  K 3.7 3.8 4.4 4.8 5.3  CL 107 105 105 104 107  CO2 _0 18*  GLUCOSE 102* 98 101* 98 96  BUN 41* 41* 42* 41* 40*  CREATININE 1.80* 2.50* 2.60* 2.23* 2.05*  CALCIUM 7.8* 7.9* 7.4* 7.4* 7.8*  PHOS  --  5.3* 3.6 3.8 4.2   Liver Function Tests:  Recent Labs Lab 01/29/13 0435 01/30/13 0100 01/31/13 0525 02/01/13 0410  AST 15 14  --   --   ALT 11 10  --   --   ALKPHOS 34* 37*  --   --   BILITOT <0.2* <0.2*  --   --   PROT 4.2* 4.1*  --   --   ALBUMIN 1.1*  1.1* 0.9* 0.9* 0.9*    Recent Labs Lab 01/28/13 1130  LIPASE 37   No results found for this basename: AMMONIA,  in the last 168 hours CBC:  Recent Labs Lab 01/27/13 2157 01/30/13 1340 01/31/13 0529 02/01/13 0410  WBC 6.3 4.2 4.1 5.4  HGB 8.6* 8.3* 8.2* 9.3*  HCT 25.6* 25.3* 24.9* 27.8*  MCV 75.1* 76.7* 75.9* 76.6*  PLT 300 226 209 226   Cardiac Enzymes:  Recent Labs Lab 01/28/13 0225 01/28/13 0716 01/28/13 1130  TROPONINI <0.30 <0.30 <0.30   BNP (last 3 results)  Recent Labs  04/25/12 1243 05/22/12 2307 01/27/13 2157  PROBNP 269.3* 40.7 262.9*   CBG:  Recent Labs Lab 01/31/13 0026 01/31/13 0750 01/31/13 1141 01/31/13 1648 02/01/13 0800  GLUCAP 96 96 104* 84 86    Recent Results (from the past 240 hour(s))  CULTURE, BLOOD (ROUTINE X 2)     Status: None   Collection Time    01/30/13 12:55 AM      Result Value Range Status   Specimen Description BLOOD LEFT ARM   Final   Special  Requests BOTTLES DRAWN AEROBIC AND ANAEROBIC 5CC EA   Final   Culture  Setup Time     Final   Value: 01/30/2013 08:13     Performed at [facility]   Culture     Final   Value:        BLOOD CULTURE RECEIVED NO GROWTH TO DATE CULTURE WILL BE HELD FOR 5 DAYS BEFORE ISSUING A FINAL NEGATIVE REPORT     Performed at [facility]   Report Status PENDING   Incomplete  CULTURE, BLOOD (ROUTINE X 2)     Status: None   Collection Time    01/30/13  1:00 AM      Result Value Range Status   Specimen Description BLOOD RIGHT ANTECUBITAL   Final   Special Requests BOTTLES DRAWN AEROBIC ONLY [facility]   Final   Culture  Setup Time     Final   Value: 01/30/2013 08:14     Performed at [facility]   Culture     Final   Value:        BLOOD CULTURE RECEIVED NO GROWTH TO DATE CULTURE WILL BE HELD FOR 5 DAYS BEFORE ISSUING A FINAL NEGATIVE REPORT     Performed at [facility]   Report Status PENDING   Incomplete  URINE CULTURE     Status: None   Collection Time    01/30/13  6:18 AM      Result Value Range Status   Specimen Description URINE, CLEAN CATCH   Final   Special Requests NONE   Final   Culture  Setup Time     Final   Value: 01/30/2013 07:08     Performed at [place]     Final   Value: 15,000 COLONIES/ML     Performed at [facility]   Culture     Final   Value: Multiple bacterial morphotypes present, none predominant. Suggest appropriate recollection if clinically indicated.     Performed at [facility]   Report Status 02/01/2013 FINAL   Final  RESPIRATORY VIRUS PANEL     Status: None   Collection Time    01/30/13  6:21 AM      Result Value Range Status   Source - RVPAN NASAL SWAB   Corrected   Comment: CORRECTED ON 01/27 AT 2204: PREVIOUSLY REPORTED AS NASAL SWAB   Respiratory Syncytial Virus A NOT DETECTED   Final   Respiratory Syncytial Virus B NOT DETECTED   Final   Influenza A NOT DETECTED   Final   Influenza B  NOT DETECTED  Final   Parainfluenza 1 NOT DETECTED   Final   Parainfluenza 2 NOT DETECTED   Final   Parainfluenza 3 NOT DETECTED   Final   Metapneumovirus NOT DETECTED   Final   Rhinovirus NOT DETECTED   Final   Adenovirus NOT DETECTED   Final   Influenza A H1 NOT DETECTED   Final   Influenza A H3 NOT DETECTED   Final   Comment: (NOTE)           Normal Reference Range for each Analyte: NOT DETECTED     Testing performed using the Luminex xTAG Respiratory Viral Panel test     kit.     This test was developed and its performance characteristics determined     by [facility]. It has not been cleared or approved by the [place]     Food and Drug Administration. This test is used for clinical purposes.     It should not be regarded as investigational or for research. This     laboratory is certified under the [place] (CLIA) as qualified to perform high complexity     clinical laboratory testing.     Performed at [facility]     Studies: Dg Chest 2 View  01/30/2013   CLINICAL DATA:  Fever, cough  EXAM: CHEST - 2 VIEW  COMPARISON:  01/27/2013  FINDINGS: Relatively low lung volumes as before. Lungs are clear. Heart size and mediastinal contours are within normal limits. No effusion. Visualized skeletal structures are unremarkable.  IMPRESSION: No acute cardiopulmonary disease.   Electronically Signed   By: Arne Cleveland M.D.   On: 01/30/2013 15:12   [name] Abdomen Wo Contrast  01/31/2013   CLINICAL DATA:  Flank pain, concern for renal vein thrombosis, history of chronic kidney disease  EXAM: MRA ABDOMEN WITHOUT CONTRAST  TECHNIQUE: Angiographic images of the abdomen were obtained using MRA technique without intravenous contrast.  COMPARISON:  US RENAL dated 05/17/2012; CT ABD/PELV WO CM dated 03/24/2012; US BIOPSY dated 04/28/2012  FINDINGS: The examination is degraded due to lack of intravenous contrast secondary to patient's chronic  renal insufficiency.  Vascular Findings:  Abdominal aorta: Normal caliber the abdominal aorta. No definite abdominal aortic dissection or periaortic stranding. No significant atherosclerotic plaque within the abdominal aorta.  Celiac artery:  Widely patent ; conventional branching pattern.  SMA: Widely patent ; conventional branching pattern.  Right renal artery: Solitary; widely patent without hemodynamically significant narrowing or definitive vessel irregularity.  Left renal artery: Solitary; widely patent without hemodynamically significant narrowing or definitive vessel irregularity.  Other: Normal flow voids are demonstrated within the IVC and grossly symmetric appearing bilateral renal veins. No definitive. Venous stranding.   -------------------------------------------------------------------------------------  Nonvascular Findings:  Incidental note is made of an approximately 1.4 x 1.8 cm left-sided parapelvic cyst (image 12, series 15). There is mild diffuse body wall edema with minimal amount of slightly asymmetric perinephric stranding, right greater than left. No urinary obstruction.  Normal noncontrast appearance of the liver and gallbladder with trace amount of fluid noted adjacent to the caudal aspect of the right lobe of the liver as well as within the gallbladder fossa. No discrete filling defects within the gallbladder to suggest cholelithiasis. Normal noncontrast appearance of the spleen and pancreas. Visualized loops of bowel are grossly normal. No definitive adenopathy within the imaged abdomen.  The imaged lower thorax is normal without definitive pleural effusion.  IMPRESSION: 1. No  evidence of renal artery stenosis, FMD or renal vein thrombosis on this noncontrast examination. 2. No evidence of urinary obstruction. 3. Mild-to-moderate diffuse body wall edema with scant amount of intra-abdominal fluid - findings suggestive of volume overload/third-spacing. No definite pleural effusion. 4.  Incidental note made of suspected 1.8 cm left-sided parapelvic cyst.   Electronically Signed   By: Sandi Mariscal M.D.   On: 01/31/2013 08:20    Scheduled Meds: . guaiFENesin  600 mg Oral BID  . insulin aspart  0-9 Units Subcutaneous BID AC  . metolazone  5 mg Oral Daily  . multivitamin with minerals  1 tablet Oral Daily  . pantoprazole  40 mg Oral Daily  . piperacillin-tazobactam (ZOSYN)  IV  3.375 g Intravenous Q8H  . polyethylene glycol  17 g Oral BID  . simvastatin  10 mg Oral QHS  . sodium bicarbonate  650 mg Oral BID  . tacrolimus  1 mg Oral BID  . torsemide  20 mg Oral BID  . vitamin C  500 mg Oral Daily   Continuous Infusions: . heparin 1,150 Units/hr (02/01/13 0840)    Active Problems:   Chest pain   Shortness of breath   HTN (hypertension)   DM (diabetes mellitus)   Hypercholesterolemia   Glomerulonephritis   Acute renal failure   Syncope   Fever, unspecified    Time spent: 25 minutes. Greater than 50% of this time was spent in direct contact with the patient coordinating care.    Lelon Frohlich  [facility] Pager [phone]  If 7PM-7AM, please contact night-coverage at [email or web address], password [facility] 02/01/2013, 2:15 PM  LOS: 5 days

## 2013-02-01 NOTE — Progress Notes (Signed)
[facility] Progress Note   Subjective:   Pt feels about the same as yesterday, afebrile.     Objective:   BP 147/82  Pulse 83  Temp(Src) 99.3 F (37.4 C) (Oral)  Resp 18  Ht $R'5\' 7"'pF$  (1.702 m)  Wt 258 lb 9.6 oz (117.3 kg)  BMI 40.49 kg/m2  SpO2 97% No intake or output data in the 24 hours ending 02/01/13 0801  Physical Exam: General: NAD.  Cardiovascular: RRR. No murmur appreciated.  Respiratory: CTAB.  Abdomen: soft, mild lower abdominal tenderness. Nondistended. +CVA tenderness on left.  Extremities: Trace LE edema.  Neuro: No focal deficits.  Labs: BMET  Recent Labs Lab 01/27/13 2157 01/28/13 1130 01/29/13 0435 01/30/13 0100 01/31/13 0525 02/01/13 0410  NA 140 140 138 136* 135* 138  K 3.8 3.7 3.8 4.4 4.8 5.3  CL 107 107 105 105 104 107  CO2 $Re'22 21 23 20 22 'Hwf$ 18*  GLUCOSE 105* 102* 98 101* 98 96  BUN 45* 41* 41* 42* 41* 40*  CREATININE 1.57* 1.80* 2.50* 2.60* 2.23* 2.05*  CALCIUM 7.9* 7.8* 7.9* 7.4* 7.4* 7.8*  PHOS  --   --  5.3* 3.6 3.8 4.2   CBC  Recent Labs Lab 01/27/13 2157 01/30/13 1340 01/31/13 0529 02/01/13 0410  WBC 6.3 4.2 4.1 5.4  HGB 8.6* 8.3* 8.2* 9.3*  HCT 25.6* 25.3* 24.9* 27.8*  MCV 75.1* 76.7* 75.9* 76.6*  PLT 300 226 209 226    Medications:    . guaiFENesin  600 mg Oral BID  . insulin aspart  0-9 Units Subcutaneous BID AC  . multivitamin with minerals  1 tablet Oral Daily  . pantoprazole  40 mg Oral Daily  . piperacillin-tazobactam (ZOSYN)  IV  3.375 g Intravenous Q8H  . polyethylene glycol  17 g Oral BID  . potassium chloride  40 mEq Oral BID  . simvastatin  10 mg Oral QHS  . tacrolimus  1 mg Oral BID  . torsemide  20 mg Oral BID  . vitamin C  500 mg Oral Daily   Assessment/ Plan:   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 Ulice Dash at [facility] nephrology, HTN, DM II, hyperlipidemia who presented to the hospital due to chest pain/syncope/dyspnea.   1) FSGS, nephrotic  syndrome, now with AKI- Followed at [facility] by Dr. Ulice Dash. Records show recent creatinine has trended up to 1.44 and was increased on tacrolimus to 1 mg BID on 1/15. MRA abdomen for renal vein thrombosis evaluation No evidence for RVT 2) Dyspnea - PE r/o, per primary. Now febrile, UCx, BCx, Respiratory cultures, Flu panel negative.  On Zosyn per primary  3) HTN - Hold lisinopril in setting of renal failure and stable BP  4) Hyperlipidemia - On statin  5) DM - Hold metformin, SSI per primary  6) Anemia microcytic 7) Hypoalbuminemia secondary to Nephrotic syndrome  8) Hyponatremia secondary to nephrotic syndrome   Plan: FSGS, nephrotic syndrome  1) Creatinine plateau and decrease(1.8 >> 2.5>>2.6>>2.2>>2.05)  (Worsening FSGS vs volume depletion to account for initial bump).  2) Continue holding Lisinopril. Restarted Demadex on 1/27 with H20 restrictions.  Consider restarting Metolazone today.   3) Tacrolimus level did not detect level.  Will repeat level today. If unmeasurable with enforced compliance will bump dose.  4) Patient now on anticoagulation with Lovenox secondary to decreased albumin.  5) Daily renal panel  6) Discussed case with Dr. Ulice Dash, primary Nephrologist at [facility].  Will transfer when bed available, pt preference.  7) Will start oral bicarb as pt looks to be developing RTA [place], DO PGY-2, [place] [facility] 02/01/2013, 8:01 AM  I have seen and examined this patient and agree with plan as outlined in the above note.Creatinine falling. Added back demadex yesterday, would restart metolazone today.  Continue holding lisinopril for now. Add oral sodium bicarb for mild acidosis.  Kiasha Bellin B,MD 02/01/2013 1:16 PM

## 2013-02-01 NOTE — Progress Notes (Signed)
ANTICOAGULATION CONSULT NOTE - Follow Up Consult  Pharmacy Consult for heparin Indication: nephrotic syndrome-- prevention of thrombosis   Allergies  Allergen Reactions  . Nsaids     Kidney disease    Patient Measurements: Height: 5\' 7"  (170.2 cm) Weight: 258 lb 9.6 oz (117.3 kg) IBW/kg (Calculated) : 61.6 Heparin Dosing Weight: 89 kg  Vital Signs: Temp: 99.3 F (37.4 C) (01/28 0613) Temp src: Oral (01/28 0613) BP: 147/82 mmHg (01/28 0613) Pulse Rate: 83 (01/28 0613)  Labs:  Recent Labs  01/30/13 0100  01/30/13 1340  01/31/13 0525 01/31/13 0529 01/31/13 1135 01/31/13 2030 02/01/13 0410  HGB  --   < > 8.3*  --   --  8.2*  --   --  9.3*  HCT  --   --  25.3*  --   --  24.9*  --   --  27.8*  PLT  --   --  226  --   --  209  --   --  226  HEPARINUNFRC  --   --   --   < >  --   --  0.93* 0.55 0.39  CREATININE 2.60*  --   --   --  2.23*  --   --   --  2.05*  < > = values in this interval not displayed.  Estimated Creatinine Clearance: 48.8 ml/min (by C-G formula based on Cr of 2.05).   Assessment: Patient is a 38 y.o F on heparin for nephrotic syndrome with plan for possible transfer to [facility] today.  Heparin level is at goal with 0.39.  No bleeding documented.  Goal of Therapy:  Heparin level 0.3-0.7 units/ml Monitor platelets by anticoagulation protocol: Yes   Plan:  1) continue heparin drip at 1150 units/hr  Marina Desire P 02/01/2013,10:12 AM

## 2013-02-02 LAB — CBC
HEMATOCRIT: 27.1 % — AB (ref 36.0–46.0)
Hemoglobin: 8.7 g/dL — ABNORMAL LOW (ref 12.0–15.0)
MCH: 24.6 pg — AB (ref 26.0–34.0)
MCHC: 32.1 g/dL (ref 30.0–36.0)
MCV: 76.6 fL — AB (ref 78.0–100.0)
Platelets: 235 10*3/uL (ref 150–400)
RBC: 3.54 MIL/uL — AB (ref 3.87–5.11)
RDW: 14 % (ref 11.5–15.5)
WBC: 4.2 10*3/uL (ref 4.0–10.5)

## 2013-02-02 LAB — RENAL FUNCTION PANEL
Albumin: 0.9 g/dL — ABNORMAL LOW (ref 3.5–5.2)
BUN: 35 mg/dL — AB (ref 6–23)
CO2: 21 mEq/L (ref 19–32)
CREATININE: 1.57 mg/dL — AB (ref 0.50–1.10)
Calcium: 7.7 mg/dL — ABNORMAL LOW (ref 8.4–10.5)
Chloride: 104 mEq/L (ref 96–112)
GFR calc Af Amer: 47 mL/min — ABNORMAL LOW (ref >90)
GFR calc non Af Amer: 41 mL/min — ABNORMAL LOW (ref >90)
GLUCOSE: 100 mg/dL — AB (ref 70–99)
POTASSIUM: 4.6 meq/L (ref 3.7–5.3)
Phosphorus: 5.2 mg/dL — ABNORMAL HIGH (ref 2.3–4.6)
Sodium: 136 mEq/L — ABNORMAL LOW (ref 137–147)

## 2013-02-02 LAB — CLOSTRIDIUM DIFFICILE BY PCR: Toxigenic C. Difficile by PCR: NEGATIVE

## 2013-02-02 LAB — GLUCOSE, CAPILLARY: GLUCOSE-CAPILLARY: 111 mg/dL — AB (ref 70–99)

## 2013-02-02 LAB — HEPARIN LEVEL (UNFRACTIONATED): Heparin Unfractionated: 0.18 IU/mL — ABNORMAL LOW (ref 0.30–0.70)

## 2013-02-02 MED ORDER — ENOXAPARIN SODIUM 120 MG/0.8ML ~~LOC~~ SOLN
115.0000 mg | Freq: Once | SUBCUTANEOUS | Status: AC
  Administered 2013-02-02: 115 mg via SUBCUTANEOUS
  Filled 2013-02-02: qty 0.8

## 2013-02-02 MED ORDER — ENOXAPARIN SODIUM 120 MG/0.8ML ~~LOC~~ SOLN: 115.0000 mg | Freq: Two times a day (BID) | SUBCUTANEOUS | Status: DC

## 2013-02-02 MED ORDER — SODIUM BICARBONATE 650 MG PO TABS: 650.0000 mg | ORAL_TABLET | Freq: Two times a day (BID) | ORAL | Status: DC

## 2013-02-02 MED ORDER — ENOXAPARIN SODIUM 120 MG/0.8ML ~~LOC~~ SOLN
115.0000 mg | Freq: Two times a day (BID) | SUBCUTANEOUS | Status: DC
  Filled 2013-02-02: qty 0.8

## 2013-02-02 NOTE — Progress Notes (Signed)
Patricia [name] Progress Note   Subjective:   Pt feeling much better today, no questions.    Objective:   BP 116/56  Pulse 76  Temp(Src) 99.3 F (37.4 C) (Oral)  Resp 18  Ht $R'5\' 7"'qb$  (1.702 m)  Wt 258 lb 9.6 oz (117.3 kg)  BMI 40.49 kg/m2  SpO2 100% No intake or output data in the 24 hours ending 02/02/13 0804  Physical Exam: General: NAD.  Cardiovascular: RRR. No murmur appreciated.  Respiratory: CTAB.  Abdomen: soft, mild lower abdominal tenderness. Nondistended. No CVA tenderness.  Extremities: No LE edema.  Neuro: No focal deficits.  Labs: BMET  Recent Labs Lab 01/27/13 2157 01/28/13 1130 01/29/13 0435 01/30/13 0100 01/31/13 0525 02/01/13 0410 02/02/13 0454  NA 140 140 138 136* 135* 138 136*  K 3.8 3.7 3.8 4.4 4.8 5.3 4.6  CL 107 107 105 105 104 107 104  CO2 $Re'22 21 23 20 22 'Pkx$ 18* 21  GLUCOSE 105* 102* 98 101* 98 96 100*  BUN 45* 41* 41* 42* 41* 40* 35*  CREATININE 1.57* 1.80* 2.50* 2.60* 2.23* 2.05* 1.57*  CALCIUM 7.9* 7.8* 7.9* 7.4* 7.4* 7.8* 7.7*  PHOS  --   --  5.3* 3.6 3.8 4.2 5.2*   CBC  Recent Labs Lab 01/30/13 1340 01/31/13 0529 02/01/13 0410 02/02/13 0454  WBC 4.2 4.1 5.4 4.2  HGB 8.3* 8.2* 9.3* 8.7*  HCT 25.3* 24.9* 27.8* 27.1*  MCV 76.7* 75.9* 76.6* 76.6*  PLT 226 209 226 235    Medications:    . guaiFENesin  600 mg Oral BID  . insulin aspart  0-9 Units Subcutaneous BID AC  . metolazone  5 mg Oral Daily  . multivitamin with minerals  1 tablet Oral Daily  . pantoprazole  40 mg Oral Daily  . polyethylene glycol  17 g Oral BID  . simvastatin  10 mg Oral QHS  . sodium bicarbonate  650 mg Oral BID  . tacrolimus  1 mg Oral BID  . torsemide  20 mg Oral BID  . 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 Ulice Dash at [facility] nephrology, HTN, DM II, hyperlipidemia who presented to the hospital due to chest pain/syncope/dyspnea.   1) FSGS,  nephrotic syndrome, now with AKI- Followed at [facility] by Dr. Ulice Dash. Records show recent creatinine has trended up to 1.44 and was increased on tacrolimus to 1 mg BID on 1/15. MRA abdomen for renal vein thrombosis evaluation No evidence for RVT 2) Dyspnea - PE r/o, per primary. Now febrile, UCx, BCx, Respiratory cultures, Flu panel negative.  On Zosyn per primary  3) HTN - Hold lisinopril in setting of renal failure and stable BP  4) Hyperlipidemia - On statin  5) DM - Hold metformin, SSI per primary  6) Anemia microcytic 7) Hypoalbuminemia secondary to Nephrotic syndrome  8) Hyponatremia secondary to nephrotic syndrome   Plan: FSGS, nephrotic syndrome  1) Creatinine plateau and decrease to almost baseline (1.8 >> 2.5>>2.6>>2.2>>2.05>>1.57)  (Worsening FSGS vs volume depletion to account for initial bump).  2) Continue holding Lisinopril. Restarted Demadex on 1/27 with H20 restrictions and metolazone on 1/28.  3) Tacrolimus level does not show detectable levels from 1/23.  Repeat level 1/28, if unmeasurable with enforced compliance, increase dosing.  4) Patient now on anticoagulation with Lovenox secondary to decreased albumin.  5) Daily renal panel  6) Discussed  case with Dr. Ulice Dash, primary Nephrologist at [facility].  Transfer vs outpt follow up. 7) Continue oral bicarb for RTA [place], DO PGY-2, [place] [facility] 02/02/2013, 8:04 AM   I have seen and examined this patient and agree with plan as outlined in the above. Pt would be suitable for discharge with outpt followup in [facility] with her nephrologist Dr. Ulice Dash.  Renal function back to baseline of recent [facility] hospitalization.  Would let Dr. Ulice Dash make the decision about restarting the lisinopril and whether or not to continue her on the Lovenox long term due to her high thrombosis risk.  Will sign off at this time. Call me if additional questions.    Patricia Pulver [name],Patricia [name] 02/02/2013 12:27 PM

## 2013-02-02 NOTE — Discharge Instructions (Signed)
Enoxaparin injection What is this medicine? ENOXAPARIN (ee nox a PA rin) is used after knee, hip, or abdominal surgeries to prevent blood clotting. It is also used to treat existing blood clots in the lungs or in the veins. This medicine may be used for other purposes; ask your health care provider or pharmacist if you have questions. COMMON BRAND NAME(S): Lovenox What should I tell my health care provider before I take this medicine? They need to know if you have any of these conditions: -bleeding disorders, hemorrhage, or hemophilia -infection of the heart or heart valves -kidney or liver disease -previous stroke -prosthetic heart valve -recent surgery or delivery of a baby -ulcer in the stomach or intestine, diverticulitis, or other bowel disease -an unusual or allergic reaction to enoxaparin, heparin, pork or pork products, other medicines, foods, dyes, or preservatives -pregnant or trying to get pregnant -breast-feeding How should I use this medicine? This medicine is for injection under the skin. It is usually given by a health-care professional. You or a family member may be trained on how to give the injections. If you are to give yourself injections, make sure you understand how to use the syringe, measure the dose if necessary, and give the injection. To avoid bruising, do not rub the site where this medicine has been injected. Do not take your medicine more often than directed. Do not stop taking except on the advice of your doctor or health care professional. Make sure you receive a puncture-resistant container to dispose of the needles and syringes once you have finished with them. Do not reuse these items. Return the container to your doctor or health care professional for proper disposal. Talk to your pediatrician regarding the use of this medicine in children. Special care may be needed. Overdosage: If you think you have taken too much of this medicine contact a poison control  center or emergency room at once. NOTE: This medicine is only for you. Do not share this medicine with others. What if I miss a dose? If you miss a dose, take it as soon as you can. If it is almost time for your next dose, take only that dose. Do not take double or extra doses. What may interact with this medicine? Do not take this medicine with any of the following medications: -aspirin and aspirin-like medicines -heparin -mifepristone -palifermin -warfarin  This medicine may also interact with the following medications: -cilostazol -clopidogrel -dipyridamole -NSAIDs, medicines for pain and inflammation, like ibuprofen or naproxen -sulfinpyrazone -ticlopidine This list may not describe all possible interactions. Give your health care provider a list of all the medicines, herbs, non-prescription drugs, or dietary supplements you use. Also tell them if you smoke, drink alcohol, or use illegal drugs. Some items may interact with your medicine. What should I watch for while using this medicine? Visit your doctor or health care professional for regular checks on your progress. Your condition will be monitored carefully while you are receiving this medicine. Notify your doctor or health care professional and seek emergency treatment if you develop breathing problems; changes in vision; chest pain; severe, sudden headache; pain, swelling, warmth in the leg; trouble speaking; sudden numbness or weakness of the face, arm, or leg. These can be signs that your condition has gotten worse. If you are going to have surgery, tell your doctor or health care professional that you are taking this medicine. Do not stop taking this medicine without first talking to your doctor. Be sure to refill your prescription  before you run out of medicine. Avoid sports and activities that might cause injury while you are using this medicine. Severe falls or injuries can cause unseen bleeding. Be careful when using sharp  tools or knives. Consider using an Copy. Take special care brushing or flossing your teeth. Report any injuries, bruising, or red spots on the skin to your doctor or health care professional. What side effects may I notice from receiving this medicine? Side effects that you should report to your doctor or health care professional as soon as possible: -allergic reactions like skin rash, itching or hives, swelling of the face, lips, or tongue -feeling faint or lightheaded, falls -signs and symptoms of bleeding such as bloody or black, tarry stools; red or dark-brown urine; spitting up blood or brown material that looks like coffee grounds; red spots on the skin; unusual bruising or bleeding from the eye, gums, or nose  Side effects that usually do not require medical attention (report to your doctor or health care professional if they continue or are bothersome): -pain, redness, or irritation at site where injected This list may not describe all possible side effects. Call your doctor for medical advice about side effects. You may report side effects to FDA at 1-800-FDA-1088. Where should I keep my medicine? Keep out of the reach of children. Store at room temperature between 15 and 30 degrees C (59 and 86 degrees F). Do not freeze. If your injections have been specially prepared, you may need to store them in the refrigerator. Ask your pharmacist. Throw away any unused medicine after the expiration date. NOTE: This sheet is a summary. It may not cover all possible information. If you have questions about this medicine, talk to your doctor, pharmacist, or health care provider.  2014, Elsevier/Gold Standard. (2012-04-19 16:13:24)

## 2013-02-02 NOTE — Discharge Summary (Signed)
Physician Discharge Summary  [name] HUD:[id number] DOB: 19-Oct-1975 DOA: 01/27/2013  PCP: Tereasa Coop, PA-C  Admit date: 01/27/2013 Discharge date: 02/02/2013  Time spent: 45 minutes  Recommendations for Outpatient Follow-up:  -Will be discharged home today.  -Has secured follow up with her nephrologist at [facility], Dr. Ulice Dash for next week.  Discharge Diagnoses:  Active Problems:   Chest pain   Shortness of breath   HTN (hypertension)   DM (diabetes mellitus)   Hypercholesterolemia   Glomerulonephritis   Acute renal failure   Syncope   Fever, unspecified   Discharge Condition: Stable and improved  Filed Weights   01/27/13 2108 01/28/13 0108 02/01/13 0613  Weight: 117.482 kg (259 lb) 118.162 kg (260 lb 8 oz) 117.3 kg (258 lb 9.6 oz)    History of present illness:  Patricia [name] is a 38 y.o. female, with known history of glomerulonephritis, been followed at [facility] nephrology, patient is on immunosuppressive medication with Prograft, patient supposed to be started on further immunosuppression next week at [facility], patient presents with complaints of syncope today, and chest pain and shortness of breath, patient reports she was in the church, she started to feel warm, dry with chest pain left-sided radiating to the left arm, sharp in quality, as well developed shortness of breath, and she had an episode of loss of consciousness, patient currently denies any chest pain or shortness of breath, patient had negative troponin, no EKG changes, but had elevated D. dimers, but unfortunately she was in acute renal failure with a creatinine of 1.57, so she could not have CT chest with IV contrast, so hospitalist requested to admit the patient, until she is able to get VQ scan in the morning, meanwhile she was started on anticoagulation with heparin.   Hospital Course:   1-Nephrotic Syndrome, FSGS; renal consulted to help with management and Medication adjustment. Cr at 1.5 on admission.  -She  is quite Hypoalbuminemic with an increased risk for Thrombosis, on therapeutic dose Lovenox. . Dr. Ulice Dash will decide whether this should be continued long-term. -Lisinopril remains on hold pending evaluation by Dr. Ulice Dash. -MRA orderedo rule out renal vein thrombosis, was negative.  -Cr maxed out at 2.60 and is now back down to 1.57 which is her baseline. -Demadex and metolazone have been restarted. -Sodium bicarb tablets added for mild metabolic acidosis.   2-Chest pain, dyspnea: elevated D dimer at 0.97. V-Q scan no evidence of pulmonary embolism. lower extremities doppler: Bilateral: No evidence of DVT, superficial thrombosis, or Baker's Cyst. ECHO: normal EF. Ruled out for ACS with negative troponins and no acute ischemic changes on EKG.  3-Syncope; in setting of dehydration. monitor on telemetry. VQ scan low prob for PE.   4-Hypercholesterolemia. Continue with statin   5-Hypertension. Holding lisinopril, given worsening renal function. Well controlled.   6-Diabetes mellitus. We will hold metformin due to her renal failure. insulin sliding scale. Well controlled.   7-Nausea: Lipase 37, normal LFT. Resolved.   8-Anemia; of chronic diseases. Hb stable at 8.   9-Fever: Has been afebrile since 1/26. All cx data is negative to date. No source of active infection.   Procedures:  None   Consultations:  Renal, Dr. Lorrene Reid  Discharge Instructions  Discharge Orders   Future Orders Complete By Expires   Discontinue IV  As directed    Increase activity slowly  As directed        Medication List    STOP taking these medications       lisinopril 40  MG tablet  Commonly known as:  PRINIVIL,ZESTRIL     metFORMIN 500 MG tablet  Commonly known as:  GLUCOPHAGE     potassium chloride 10 MEQ tablet  Commonly known as:  K-DUR,KLOR-CON      TAKE these medications       enoxaparin 120 MG/0.8ML injection  Commonly known as:  LOVENOX  Inject 0.77 mLs (115 mg total) into the skin  every 12 (twelve) hours.     LORazepam 0.5 MG tablet  Commonly known as:  ATIVAN  Take 0.5 mg by mouth every 6 (six) hours as needed. For anxiety     metolazone 5 MG tablet  Commonly known as:  ZAROXOLYN  Take 5 mg by mouth daily.     multivitamin with minerals Tabs tablet  Take 1 tablet by mouth daily.     omeprazole 40 MG capsule  Commonly known as:  PRILOSEC  Take 40 mg by mouth daily.     simvastatin 10 MG tablet  Commonly known as:  ZOCOR  Take 10 mg by mouth at bedtime.     sodium bicarbonate 650 MG tablet  Take 1 tablet (650 mg total) by mouth 2 (two) times daily.     tacrolimus 0.5 MG capsule  Commonly known as:  PROGRAF  Take 1 mg by mouth 2 (two) times daily.     torsemide 20 MG tablet  Commonly known as:  DEMADEX  Take 20 mg by mouth 2 (two) times daily.     vitamin C 500 MG tablet  Commonly known as:  ASCORBIC ACID  Take 500 mg by mouth daily.       Allergies  Allergen Reactions  . Nsaids     Kidney disease       Follow-up Information   Follow up with Timoteo Ace. Schedule an appointment as soon as possible for a visit in 1 week.   Specialty:  Nephrology   Contact information:   [place], [id number] [place] [address]        The results of significant diagnostics from this hospitalization (including imaging, microbiology, ancillary and laboratory) are listed below for reference.    Significant Diagnostic Studies: Dg Chest 2 View  01/30/2013   CLINICAL DATA:  Fever, cough  EXAM: CHEST - 2 VIEW  COMPARISON:  01/27/2013  FINDINGS: Relatively low lung volumes as before. Lungs are clear. Heart size and mediastinal contours are within normal limits. No effusion. Visualized skeletal structures are unremarkable.  IMPRESSION: No acute cardiopulmonary disease.   Electronically Signed   By: Arne Cleveland M.D.   On: 01/30/2013 15:12   [name] Abdomen Wo Contrast  01/31/2013   CLINICAL DATA:  Flank pain,  concern for renal vein thrombosis, history of chronic kidney disease  EXAM: MRA ABDOMEN WITHOUT CONTRAST  TECHNIQUE: Angiographic images of the abdomen were obtained using MRA technique without intravenous contrast.  COMPARISON:  US RENAL dated 05/17/2012; CT ABD/PELV WO CM dated 03/24/2012; US BIOPSY dated 04/28/2012  FINDINGS: The examination is degraded due to lack of intravenous contrast secondary to patient's chronic renal insufficiency.  Vascular Findings:  Abdominal aorta: Normal caliber the abdominal aorta. No definite abdominal aortic dissection or periaortic stranding. No significant atherosclerotic plaque within the abdominal aorta.  Celiac artery:  Widely patent ; conventional branching pattern.  SMA: Widely patent ; conventional branching pattern.  Right renal artery: Solitary; widely patent without hemodynamically significant narrowing or definitive vessel irregularity.  Left renal artery: Solitary;  widely patent without hemodynamically significant narrowing or definitive vessel irregularity.  Other: Normal flow voids are demonstrated within the IVC and grossly symmetric appearing bilateral renal veins. No definitive. Venous stranding.   -------------------------------------------------------------------------------------  Nonvascular Findings:  Incidental note is made of an approximately 1.4 x 1.8 cm left-sided parapelvic cyst (image 12, series 15). There is mild diffuse body wall edema with minimal amount of slightly asymmetric perinephric stranding, right greater than left. No urinary obstruction.  Normal noncontrast appearance of the liver and gallbladder with trace amount of fluid noted adjacent to the caudal aspect of the right lobe of the liver as well as within the gallbladder fossa. No discrete filling defects within the gallbladder to suggest cholelithiasis. Normal noncontrast appearance of the spleen and pancreas. Visualized loops of bowel are grossly normal. No definitive adenopathy within the  imaged abdomen.  The imaged lower thorax is normal without definitive pleural effusion.  IMPRESSION: 1. No evidence of renal artery stenosis, FMD or renal vein thrombosis on this noncontrast examination. 2. No evidence of urinary obstruction. 3. Mild-to-moderate diffuse body wall edema with scant amount of intra-abdominal fluid - findings suggestive of volume overload/third-spacing. No definite pleural effusion. 4. Incidental note made of suspected 1.8 cm left-sided parapelvic cyst.   Electronically Signed   By: Sandi Mariscal M.D.   On: 01/31/2013 08:20   Nm Pulmonary Perf And Vent  01/28/2013   CLINICAL DATA:  Chest pain and shortness of breath.  EXAM: NUCLEAR MEDICINE VENTILATION - PERFUSION LUNG SCAN  TECHNIQUE: Ventilation images were obtained in multiple projections using inhaled aerosol technetium 99 M DTPA. Perfusion images were obtained in multiple projections after intravenous injection of Tc-4mMAA.  COMPARISON:  Chest radiograph 01/27/2013  RADIOPHARMACEUTICALS:  Forty mCi Tc-952mTPA aerosol and 6 mCi Tc-9928mA  FINDINGS: Ventilation: There is inhomogeneous ventilation without segmental ventilatory defect.  Perfusion: No wedge shaped peripheral perfusion defects to suggest acute pulmonary embolism. Mildly inhomogeneous perfusion is identified.  IMPRESSION: No segmental ventilatory or perfusion defect to suggest pulmonary embolism. Given the presence of low lung volumes on the prior exam, inhomogeneity to the ventilatory radiotracer appearance could reflect atelectasis/hypoaeration.   Electronically Signed   By: GreConchita ParisD.   On: 01/28/2013 10:02   Dg Chest Port 1 View  01/27/2013   CLINICAL DATA:  Chest pain  EXAM: PORTABLE CHEST - 1 VIEW  COMPARISON:  None.  FINDINGS: The heart size and mediastinal contours are within normal limits. Both lungs are clear. The visualized skeletal structures are unremarkable.  IMPRESSION: No active disease.   Electronically Signed   By: TayKerby MoorsD.    On: 01/27/2013 21:31    Microbiology: Recent Results (from the past 240 hour(s))  CULTURE, BLOOD (ROUTINE X 2)     Status: None   Collection Time    01/30/13 12:55 AM      Result Value Range Status   Specimen Description BLOOD LEFT ARM   Final   Special Requests BOTTLES DRAWN AEROBIC AND ANAEROBIC 5CC EA   Final   Culture  Setup Time     Final   Value: 01/30/2013 08:13     [facility]     Final   Value:        BLOOD CULTURE RECEIVED NO GROWTH TO DATE CULTURE WILL BE HELD FOR 5 DAYS BEFORE ISSUING A FINAL NEGATIVE REPORT     [facility] PENDING   Incomplete  CULTURE, BLOOD (ROUTINE X 2)     Status: None   Collection Time    01/30/13  1:00 AM      Result Value Range Status   Specimen Description BLOOD RIGHT ANTECUBITAL   Final   Special Requests BOTTLES DRAWN AEROBIC ONLY [facility]   Final   Culture  Setup Time     Final   Value: 01/30/2013 08:14     Performed at [facility]   Culture     Final   Value:        BLOOD CULTURE RECEIVED NO GROWTH TO DATE CULTURE WILL BE HELD FOR 5 DAYS BEFORE ISSUING A FINAL NEGATIVE REPORT     Performed at [facility]   Report Status PENDING   Incomplete  URINE CULTURE     Status: None   Collection Time    01/30/13  6:18 AM      Result Value Range Status   Specimen Description URINE, CLEAN CATCH   Final   Special Requests NONE   Final   Culture  Setup Time     Final   Value: 01/30/2013 07:08     Performed at [place]     Final   Value: 15,000 COLONIES/ML     Performed at [facility]   Culture     Final   Value: Multiple bacterial morphotypes present, none predominant. Suggest appropriate recollection if clinically indicated.     Performed at [facility]   Report Status 02/01/2013 FINAL   Final  RESPIRATORY VIRUS PANEL     Status: None   Collection Time    01/30/13  6:21 AM      Result Value Range Status   Source  - [facility] NASAL SWAB   Corrected   Comment: CORRECTED ON 01/27 AT 2204: PREVIOUSLY REPORTED AS NASAL SWAB   Respiratory Syncytial Virus A NOT DETECTED   Final   Respiratory Syncytial Virus B NOT DETECTED   Final   Influenza A NOT DETECTED   Final   Influenza B NOT DETECTED   Final   Parainfluenza 1 NOT DETECTED   Final   Parainfluenza 2 NOT DETECTED   Final   Parainfluenza 3 NOT DETECTED   Final   Metapneumovirus NOT DETECTED   Final   Rhinovirus NOT DETECTED   Final   Adenovirus NOT DETECTED   Final   Influenza A H1 NOT DETECTED   Final   Influenza A H3 NOT DETECTED   Final   Comment: (NOTE)           Normal Reference Range for each Analyte: NOT DETECTED     Testing performed using the Luminex xTAG Respiratory Viral Panel test     kit.     This test was developed and its performance characteristics determined     by [facility]. It has not been cleared or approved by the [place]     Food and Drug Administration. This test is used for clinical purposes.     It should not be regarded as investigational or for research. This     laboratory is certified under the [place] (CLIA) as qualified to perform high complexity     clinical laboratory testing.     Performed at [facility] DIFFICILE BY PCR     Status: None   Collection Time    02/01/13 10:05 PM  Result Value Range Status   C difficile by pcr NEGATIVE  NEGATIVE Final     Labs: Basic Metabolic Panel:  Recent Labs Lab 01/29/13 0435 01/30/13 0100 01/31/13 0525 02/01/13 0410 02/02/13 0454  NA 138 136* 135* 138 136*  K 3.8 4.4 4.8 5.3 4.6  CL 105 105 104 107 104  CO2 '23 20 22 ' 18* 21  GLUCOSE 98 101* 98 96 100*  BUN 41* 42* 41* 40* 35*  CREATININE 2.50* 2.60* 2.23* 2.05* 1.57*  CALCIUM 7.9* 7.4* 7.4* 7.8* 7.7*  PHOS 5.3* 3.6 3.8 4.2 5.2*   Liver Function Tests:  Recent Labs Lab 01/29/13 0435 01/30/13 0100 01/31/13 0525 02/01/13 0410  02/02/13 0454  AST 15 14  --   --   --   ALT 11 10  --   --   --   ALKPHOS 34* 37*  --   --   --   BILITOT <0.2* <0.2*  --   --   --   PROT 4.2* 4.1*  --   --   --   ALBUMIN 1.1*  1.1* 0.9* 0.9* 0.9* 0.9*    Recent Labs Lab 01/28/13 1130  LIPASE 37   No results found for this basename: AMMONIA,  in the last 168 hours CBC:  Recent Labs Lab 01/27/13 2157 01/30/13 1340 01/31/13 0529 02/01/13 0410 02/02/13 0454  WBC 6.3 4.2 4.1 5.4 4.2  HGB 8.6* 8.3* 8.2* 9.3* 8.7*  HCT 25.6* 25.3* 24.9* 27.8* 27.1*  MCV 75.1* 76.7* 75.9* 76.6* 76.6*  PLT 300 226 209 226 235   Cardiac Enzymes:  Recent Labs Lab 01/28/13 0225 01/28/13 0716 01/28/13 1130  TROPONINI <0.30 <0.30 <0.30   BNP: BNP (last 3 results)  Recent Labs  04/25/12 1243 05/22/12 2307 01/27/13 2157  PROBNP 269.3* 40.7 262.9*   CBG:  Recent Labs Lab 01/31/13 1141 01/31/13 1648 02/01/13 0800 02/01/13 1646 02/02/13 0751  GLUCAP 104* 84 86 98 111*       Signed:  HERNANDEZ ACOSTA,ESTELA  [facility] Pager: [phone] 02/02/2013, 1:37 PM

## 2013-02-02 NOTE — Progress Notes (Addendum)
ANTICOAGULATION CONSULT NOTE Pharmacy Consult for heparin Indication: nephrotic syndrome-- prevention of thrombosis   Allergies  Allergen Reactions  . Nsaids     Kidney disease    Patient Measurements: Height: 5\' 7"  (170.2 cm) Weight: 258 lb 9.6 oz (117.3 kg) IBW/kg (Calculated) : 61.6 Heparin Dosing Weight: 89 kg  Vital Signs: Temp: 99.3 F (37.4 C) (01/29 0553) Temp src: Oral (01/29 0553) BP: 116/56 mmHg (01/29 0553) Pulse Rate: 76 (01/29 0553)  Labs:  Recent Labs  01/31/13 0525 01/31/13 0529  01/31/13 2030 02/01/13 0410 02/02/13 0454  HGB  --  8.2*  --   --  9.3* 8.7*  HCT  --  24.9*  --   --  27.8* 27.1*  PLT  --  209  --   --  226 235  HEPARINUNFRC  --   --   < > 0.55 0.39 0.18*  CREATININE 2.23*  --   --   --  2.05* 1.57*  < > = values in this interval not displayed.  Estimated Creatinine Clearance: 63.7 ml/min (by C-G formula based on Cr of 1.57).   Assessment: 38 y.o Female with nephrotic syndrome for heparin Goal of Therapy:  Heparin level 0.3-0.7 units/ml Monitor platelets by anticoagulation protocol: Yes   Plan:  Increase Heparin 1350 units/hr  Caryl Pina 02/02/2013,6:28 AM  Adden: Per Dr. Jerilee Hoh, transition heparin to lovenox.  Will d/c heparin and start lovenox 115mg  SQ q12h with first dose to be given 1 hour after after d/c of heparin drip.

## 2013-02-04 LAB — TACROLIMUS LEVEL: Tacrolimus (FK506) - LabCorp: NOT DETECTED ng/mL

## 2013-02-05 LAB — CULTURE, BLOOD (ROUTINE X 2)
CULTURE: NO GROWTH
Culture: NO GROWTH

## 2013-02-23 ENCOUNTER — Encounter (HOSPITAL_COMMUNITY): Payer: Self-pay | Admitting: Emergency Medicine

## 2013-02-23 ENCOUNTER — Emergency Department (HOSPITAL_COMMUNITY)
Admission: EM | Admit: 2013-02-23 | Discharge: 2013-02-24 | Disposition: A | Discharge status: Home / Self Care | Payer: Managed Care, Other (non HMO) | Attending: Emergency Medicine | Admitting: Emergency Medicine

## 2013-02-23 ENCOUNTER — Emergency Department (HOSPITAL_COMMUNITY): Payer: Managed Care, Other (non HMO)

## 2013-02-23 DIAGNOSIS — N289 Disorder of kidney and ureter, unspecified: Secondary | ICD-10-CM | POA: Insufficient documentation

## 2013-02-23 DIAGNOSIS — R209 Unspecified disturbances of skin sensation: Secondary | ICD-10-CM | POA: Insufficient documentation

## 2013-02-23 DIAGNOSIS — R0781 Pleurodynia: Secondary | ICD-10-CM

## 2013-02-23 DIAGNOSIS — I10 Essential (primary) hypertension: Secondary | ICD-10-CM | POA: Insufficient documentation

## 2013-02-23 DIAGNOSIS — E119 Type 2 diabetes mellitus without complications: Secondary | ICD-10-CM | POA: Insufficient documentation

## 2013-02-23 DIAGNOSIS — K219 Gastro-esophageal reflux disease without esophagitis: Secondary | ICD-10-CM | POA: Insufficient documentation

## 2013-02-23 DIAGNOSIS — E7800 Pure hypercholesterolemia, unspecified: Secondary | ICD-10-CM | POA: Insufficient documentation

## 2013-02-23 DIAGNOSIS — Z79899 Other long term (current) drug therapy: Secondary | ICD-10-CM | POA: Insufficient documentation

## 2013-02-23 DIAGNOSIS — Z888 Allergy status to other drugs, medicaments and biological substances status: Secondary | ICD-10-CM | POA: Insufficient documentation

## 2013-02-23 DIAGNOSIS — R071 Chest pain on breathing: Secondary | ICD-10-CM | POA: Insufficient documentation

## 2013-02-23 LAB — CBC
HEMATOCRIT: 27.9 % — AB (ref 36.0–46.0)
Hemoglobin: 9.2 g/dL — ABNORMAL LOW (ref 12.0–15.0)
MCH: 25.1 pg — AB (ref 26.0–34.0)
MCHC: 33 g/dL (ref 30.0–36.0)
MCV: 76 fL — AB (ref 78.0–100.0)
PLATELETS: 293 10*3/uL (ref 150–400)
RBC: 3.67 MIL/uL — ABNORMAL LOW (ref 3.87–5.11)
RDW: 13.7 % (ref 11.5–15.5)
WBC: 5.1 10*3/uL (ref 4.0–10.5)

## 2013-02-23 LAB — COMPREHENSIVE METABOLIC PANEL
ALT: 8 U/L (ref 0–35)
AST: 17 U/L (ref 0–37)
Albumin: 1.4 g/dL — ABNORMAL LOW (ref 3.5–5.2)
Alkaline Phosphatase: 32 U/L — ABNORMAL LOW (ref 39–117)
BUN: 32 mg/dL — AB (ref 6–23)
CALCIUM: 8.4 mg/dL (ref 8.4–10.5)
CO2: 25 meq/L (ref 19–32)
Chloride: 104 mEq/L (ref 96–112)
Creatinine, Ser: 1.99 mg/dL — ABNORMAL HIGH (ref 0.50–1.10)
GFR calc non Af Amer: 30 mL/min — ABNORMAL LOW (ref >90)
GFR, EST AFRICAN AMERICAN: 35 mL/min — AB (ref >90)
GLUCOSE: 95 mg/dL (ref 70–99)
Potassium: 3.5 mEq/L — ABNORMAL LOW (ref 3.7–5.3)
Sodium: 142 mEq/L (ref 137–147)
TOTAL PROTEIN: 4.7 g/dL — AB (ref 6.0–8.3)
Total Bilirubin: <0.2 mg/dL — ABNORMAL LOW (ref 0.3–1.2)

## 2013-02-23 LAB — I-STAT TROPONIN, ED: Troponin i, poc: 0.01 ng/mL (ref 0.00–0.08)

## 2013-02-23 MED ORDER — HYDROMORPHONE HCL PF 1 MG/ML IJ SOLN
1.0000 mg | Freq: Once | INTRAMUSCULAR | Status: AC
  Administered 2013-02-23: 1 mg via INTRAVENOUS
  Filled 2013-02-23: qty 1

## 2013-02-23 MED ORDER — HYDROCODONE-ACETAMINOPHEN 5-325 MG PO TABS: 1.0000 | ORAL_TABLET | ORAL | Status: DC | PRN

## 2013-02-23 MED ORDER — MORPHINE SULFATE 4 MG/ML IJ SOLN
4.0000 mg | Freq: Once | INTRAMUSCULAR | Status: AC
  Administered 2013-02-23: 4 mg via INTRAVENOUS
  Filled 2013-02-23: qty 1

## 2013-02-23 MED ORDER — ONDANSETRON HCL 4 MG PO TABS: 4.0000 mg | ORAL_TABLET | Freq: Four times a day (QID) | ORAL | Status: DC

## 2013-02-23 NOTE — ED Provider Notes (Signed)
CSN: [id number]     Arrival date & time 02/23/13  1944 History   First MD Initiated Contact with Patient 02/23/13 2142     Chief Complaint  Patient presents with  . Chest Pain   HPI Comments: 38 yo F hx of HTN, HLD, DMII, nephrotic syndrome, presents with CC of CP.  Pt states symptoms began at 6 PM.  She endorses left sided, pleuritic chest pain, worsened with deep breathing, described at sharp, nonradiating.  She states at the time it started she was laying on her left side.  She also endorses some left arm numbness, which has improved, and believes she was probably laying on this arm.  She denies fever, chills, SOB, cough, hemoptysis, abdominal pain, nausea, vomiting, diarrhea, myalgias, rash, or any other symptoms.  Pt has not tried anything to relieve her pain.  Pt was concerned for "blood clot", and came to ED for further evaluation.  Of note, pt recently admitted this past January for nephrotic syndrome, syncope, chest pain.  At that time she had a VQ scan which was negative for PE.    Patient is a 38 y.o. female presenting with chest pain. The history is provided by the patient. No language interpreter was used.  Chest Pain Pain location:  L chest Pain quality: sharp   Pain radiates to:  Does not radiate Pain radiates to the back: no   Pain severity:  Moderate Onset quality:  Gradual Timing:  Intermittent Progression:  Unchanged Chronicity:  New Context: breathing   Context: no drug use, not eating, no intercourse, not lifting, no movement, not raising an arm, not at rest, no stress and no trauma   Relieved by:  Nothing Worsened by:  Deep breathing Ineffective treatments:  None tried Associated symptoms: numbness   Associated symptoms: no abdominal pain, no back pain, no claudication, no cough, no diaphoresis, no dizziness, no dysphagia, no fever, no headache, no lower extremity edema, no nausea, no near-syncope, no orthopnea, no palpitations, no shortness of breath, no syncope, not  vomiting and no weakness   Risk factors: diabetes mellitus, high cholesterol, hypertension and obesity   Risk factors: no birth control and no prior DVT/PE     Past Medical History  Diagnosis Date  . Hypertension   . Diabetes mellitus   . Acid reflux   . High cholesterol   . Renal disorder     kidney issues   Past Surgical History  Procedure Laterality Date  . Abdominal hysterectomy     Family History  Problem Relation Age of Onset  . Hypertension Mother   . Thyroid disease Mother   . Coronary artery disease Father   . Hypertension Father   . Diabetes Father    History  Substance Use Topics  . Smoking status: Never Smoker   . Smokeless tobacco: Not on file  . Alcohol Use: No   OB History   Grav Para Term Preterm Abortions TAB SAB Ect Mult Living                 Review of Systems  Constitutional: Negative for fever and diaphoresis.  HENT: Negative for trouble swallowing.   Respiratory: Negative for cough and shortness of breath.   Cardiovascular: Positive for chest pain. Negative for palpitations, orthopnea, claudication, leg swelling, syncope and near-syncope.  Gastrointestinal: Negative for nausea, vomiting and abdominal pain.  Musculoskeletal: Negative for back pain.  Skin: Negative for rash.  Neurological: Positive for numbness. Negative for dizziness, weakness, light-headedness and headaches.  Hematological: Negative for adenopathy. Does not bruise/bleed easily.  All other systems reviewed and are negative.      Allergies  Nsaids  Home Medications   Current Outpatient Rx  Name  Route  Sig  Dispense  Refill  . LORazepam (ATIVAN) 0.5 MG tablet   Oral   Take 0.5 mg by mouth every 6 (six) hours as needed. For anxiety         . metolazone (ZAROXOLYN) 5 MG tablet   Oral   Take 5 mg by mouth daily.         . Multiple Vitamin (MULTIVITAMIN WITH MINERALS) TABS tablet   Oral   Take 1 tablet by mouth daily.         Marland Kitchen omeprazole (PRILOSEC) 40 MG  capsule   Oral   Take 40 mg by mouth daily.           . simvastatin (ZOCOR) 10 MG tablet   Oral   Take 10 mg by mouth at bedtime.         . tacrolimus (PROGRAF) 0.5 MG capsule   Oral   Take 1 mg by mouth 2 (two) times daily.         Marland Kitchen torsemide (DEMADEX) 20 MG tablet   Oral   Take 20 mg by mouth 2 (two) times daily.         . vitamin C (ASCORBIC ACID) 500 MG tablet   Oral   Take 500 mg by mouth daily.          BP 129/55  Pulse 70  Temp(Src) 98.2 F (36.8 C) (Oral)  Resp 15  Ht $R'5\' 7"'tA$  (1.702 m)  Wt 240 lb (108.863 kg)  BMI 37.58 kg/m2  SpO2 100% Physical Exam  Nursing note and vitals reviewed. Constitutional: She is oriented to person, place, and time. She appears well-developed and well-nourished.  HENT:  Head: Normocephalic and atraumatic.  Right Ear: External ear normal.  Left Ear: External ear normal.  Nose: Nose normal.  Mouth/Throat: Oropharynx is clear and moist.  Eyes: Conjunctivae and EOM are normal. Pupils are equal, round, and reactive to light.  Neck: Normal range of motion. Neck supple.  Cardiovascular: Normal rate, regular rhythm, normal heart sounds and intact distal pulses.   Pulmonary/Chest: Effort normal and breath sounds normal. No respiratory distress. She has no wheezes. She has no rales. She exhibits no tenderness.  Abdominal: Soft. Bowel sounds are normal. She exhibits no distension and no mass. There is no tenderness. There is no rebound and no guarding.  Musculoskeletal: Normal range of motion.  Neurological: She is alert and oriented to person, place, and time.  No sensory or motor deficits globally, including LUE.    Skin: Skin is warm and dry.    ED Course  Procedures (including critical care time) Labs Review Labs Reviewed  CBC - Abnormal; Notable for the following:    RBC 3.67 (*)    Hemoglobin 9.2 (*)    HCT 27.9 (*)    MCV 76.0 (*)    MCH 25.1 (*)    All other components within normal limits  COMPREHENSIVE METABOLIC  PANEL - Abnormal; Notable for the following:    Potassium 3.5 (*)    BUN 32 (*)    Creatinine, Ser 1.99 (*)    Total Protein 4.7 (*)    Albumin 1.4 (*)    Alkaline Phosphatase 32 (*)    Total Bilirubin <0.2 (*)    GFR calc non Af Amer 30 (*)  GFR calc Af Amer 35 (*)    All other components within normal limits  I-STAT TROPOININ, ED   Imaging Review Dg Chest Portable 1 View  02/23/2013   CLINICAL DATA:  One day history of substernal chest pain radiating into the left arm.  EXAM: PORTABLE CHEST - 1 VIEW  COMPARISON:  DG CHEST 2 VIEW dated 01/30/2013; DG CHEST 1V PORT dated 01/27/2013; DG CHEST 2 VIEW dated 05/22/2012; DG CHEST 1V PORT dated 04/28/2012  FINDINGS: Suboptimal inspiration due to body habitus accounts for crowded bronchovascular markings, especially in the bases, and accentuates the cardiac silhouette. Taking this into account, cardiomediastinal silhouette unremarkable and unchanged. Lungs clear. Bronchovascular markings normal. Pulmonary vascularity normal. No visible pleural effusions. No pneumothorax.  IMPRESSION: Suboptimal inspiration. No acute cardiopulmonary disease.   Electronically Signed   By: Evangeline Dakin M.D.   On: 02/23/2013 20:36    EKG Interpretation    Date/Time:  Thursday February 23 2013 19:54:41 EST Ventricular Rate:  74 PR Interval:  151 QRS Duration: 94 QT Interval:  413 QTC Calculation: 458 R Axis:   48 Text Interpretation:  Sinus rhythm Borderline T abnormalities, anterior leads No significant change since last tracing Confirmed by Rule [id number]) on 02/23/2013 8:20:26 PM Also confirmed by Christy Gentles  MD, [place] [phone])  on 02/23/2013 8:20:57 PM            MDM   Final diagnoses:  None   38 yo F hx of HTN, HLD, DMII, nephrotic syndrome, presents with CC of CP.   Filed Vitals:   02/23/13 2245  BP: 129/55  Pulse: 70  Temp:   Resp: 15   Physical exam as above.  Pt afebrile, satting 100% on RA, HR 70's.  Lungs are CTAB.  No  reproducible chest tenderness. Pt looks well, and in no distress.  EKG as above, NSR, with no ischemic changes.  CXR poor inspiration, no acute abnormality.  Troponin 0.01.  CBC show microcytic anemia, stable from previous.  BUN/Cr are elevated, but again stable from prior.  I have low suspicion for ACS given nonischemic EKG, normal tropoinin, atypical presentation.  I also have low suspicion for PE, given pt NOT hypoxic, NOT tachycardic, no unilateral leg swelling, no recent surgery/immobilization, and normal V/Q scan on recent admission.  I have low suspicion for myocarditis/pericarditis with nondiagnostic EKG, atypical presentation.    Diagnosis of chest pain, suggestive of pleurisy.  Pt given IV pain meds, with improvement in pain.  Pt to be d/c home in good condition.  Encouraged to continue supportive care.  F/u with PCP in 1 week.  Strict return precautions given.  Pt understands and agrees with plan.  I have discussed pt's care plan with Dr. Christy Gentles.  Sinda Du, MD      Sinda Du, MD 02/24/13 [phone]

## 2013-02-23 NOTE — Discharge Instructions (Signed)
Chest Pain (Nonspecific) °It is often hard to give a specific diagnosis for the cause of chest pain. There is always a chance that your pain could be related to something serious, such as a heart attack or a blood clot in the lungs. You need to follow up with your caregiver for further evaluation. °CAUSES  °· Heartburn. °· Pneumonia or bronchitis. °· Anxiety or stress. °· Inflammation around your heart (pericarditis) or lung (pleuritis or pleurisy). °· A blood clot in the lung. °· A collapsed lung (pneumothorax). It can develop suddenly on its own (spontaneous pneumothorax) or from injury (trauma) to the chest. °· Shingles infection (herpes zoster virus). °The chest wall is composed of bones, muscles, and cartilage. Any of these can be the source of the pain. °· The bones can be bruised by injury. °· The muscles or cartilage can be strained by coughing or overwork. °· The cartilage can be affected by inflammation and become sore (costochondritis). °DIAGNOSIS  °Lab tests or other studies, such as X-rays, electrocardiography, stress testing, or cardiac imaging, may be needed to find the cause of your pain.  °TREATMENT  °· Treatment depends on what may be causing your chest pain. Treatment may include: °· Acid blockers for heartburn. °· Anti-inflammatory medicine. °· Pain medicine for inflammatory conditions. °· Antibiotics if an infection is present. °· You may be advised to change lifestyle habits. This includes stopping smoking and avoiding alcohol, caffeine, and chocolate. °· You may be advised to keep your head raised (elevated) when sleeping. This reduces the chance of acid going backward from your stomach into your esophagus. °· Most of the time, nonspecific chest pain will improve within 2 to 3 days with rest and mild pain medicine. °HOME CARE INSTRUCTIONS  °· If antibiotics were prescribed, take your antibiotics as directed. Finish them even if you start to feel better. °· For the next few days, avoid physical  activities that bring on chest pain. Continue physical activities as directed. °· Do not smoke. °· Avoid drinking alcohol. °· Only take over-the-counter or prescription medicine for pain, discomfort, or fever as directed by your caregiver. °· Follow your caregiver's suggestions for further testing if your chest pain does not go away. °· Keep any follow-up appointments you made. If you do not go to an appointment, you could develop lasting (chronic) problems with pain. If there is any problem keeping an appointment, you must call to reschedule. °SEEK MEDICAL CARE IF:  °· You think you are having problems from the medicine you are taking. Read your medicine instructions carefully. °· Your chest pain does not go away, even after treatment. °· You develop a rash with blisters on your chest. °SEEK IMMEDIATE MEDICAL CARE IF:  °· You have increased chest pain or pain that spreads to your arm, neck, jaw, back, or abdomen. °· You develop shortness of breath, an increasing cough, or you are coughing up blood. °· You have severe back or abdominal pain, feel nauseous, or vomit. °· You develop severe weakness, fainting, or chills. °· You have a fever. °THIS IS AN EMERGENCY. Do not wait to see if the pain will go away. Get medical help at once. Call your local emergency services (911 in [place] Do not drive yourself to the hospital. °MAKE SURE YOU:  °· Understand these instructions. °· Will watch your condition. °· Will get help right away if you are not doing well or get worse. °Document Released: 10/01/2004 Document Revised: 03/16/2011 Document Reviewed: 07/28/2007 °[employer]® Patient Information ©2014 [employer]   [employer]. ° °

## 2013-02-23 NOTE — ED Notes (Signed)
PT presents via [facility] from home with c/o sudden onset of Chest Pain that the pt describes as 10/10, sharp, pressure worse with inspiration. Pt reports also felt dizzy, and left arm numbness and pain with onset of CP. Pt reports having back pain and left arm numbness at this time, denies dizziness.

## 2013-02-24 MED ORDER — ONDANSETRON HCL 4 MG PO TABS
8.0000 mg | ORAL_TABLET | Freq: Once | ORAL | Status: AC
  Administered 2013-02-24: 8 mg via ORAL

## 2013-02-24 NOTE — ED Provider Notes (Signed)
I have personally seen and examined the patient.  I have discussed the plan of care with the resident.  I have reviewed the documentation on PMH/FH/Soc. History.  I have reviewed the documentation of the resident and agree.  I have reviewed and agree with the ECG interpretation(s) documented by the resident.  Pt well appearing and in no distress Pt with recent evaluation for PE that was negative.  She is clinically stable, no distress noted.  I doubt acute PE/dissection or other acute cardiopulmonary process   Sharyon Cable, MD 02/24/13 [phone]

## 2013-05-08 ENCOUNTER — Emergency Department (HOSPITAL_COMMUNITY): Payer: Medicaid Other

## 2013-05-08 ENCOUNTER — Encounter (HOSPITAL_COMMUNITY): Payer: Self-pay | Admitting: Emergency Medicine

## 2013-05-08 ENCOUNTER — Emergency Department (HOSPITAL_COMMUNITY)
Admission: EM | Admit: 2013-05-08 | Discharge: 2013-05-08 | Disposition: A | Discharge status: Home / Self Care | Payer: Medicaid Other | Attending: Emergency Medicine | Admitting: Emergency Medicine

## 2013-05-08 DIAGNOSIS — Z79899 Other long term (current) drug therapy: Secondary | ICD-10-CM | POA: Insufficient documentation

## 2013-05-08 DIAGNOSIS — E119 Type 2 diabetes mellitus without complications: Secondary | ICD-10-CM | POA: Insufficient documentation

## 2013-05-08 DIAGNOSIS — R079 Chest pain, unspecified: Secondary | ICD-10-CM

## 2013-05-08 DIAGNOSIS — I129 Hypertensive chronic kidney disease with stage 1 through stage 4 chronic kidney disease, or unspecified chronic kidney disease: Secondary | ICD-10-CM | POA: Insufficient documentation

## 2013-05-08 DIAGNOSIS — E7800 Pure hypercholesterolemia, unspecified: Secondary | ICD-10-CM | POA: Insufficient documentation

## 2013-05-08 DIAGNOSIS — N189 Chronic kidney disease, unspecified: Secondary | ICD-10-CM | POA: Insufficient documentation

## 2013-05-08 DIAGNOSIS — K219 Gastro-esophageal reflux disease without esophagitis: Secondary | ICD-10-CM | POA: Insufficient documentation

## 2013-05-08 LAB — COMPREHENSIVE METABOLIC PANEL
AST: 27 U/L (ref 0–37)
Albumin: 1.3 g/dL — ABNORMAL LOW (ref 3.5–5.2)
Alkaline Phosphatase: 44 U/L (ref 39–117)
CO2: 23 mEq/L (ref 19–32)
Chloride: 104 mEq/L (ref 96–112)
Creatinine, Ser: 1.43 mg/dL — ABNORMAL HIGH (ref 0.50–1.10)
GFR calc non Af Amer: 45 mL/min — ABNORMAL LOW (ref >90)
Total Bilirubin: <0.2 mg/dL — ABNORMAL LOW (ref 0.3–1.2)
Total Protein: 5 g/dL — ABNORMAL LOW (ref 6.0–8.3)

## 2013-05-08 LAB — COMPREHENSIVE METABOLIC PANEL WITH GFR
ALT: 13 U/L (ref 0–35)
BUN: 36 mg/dL — ABNORMAL HIGH (ref 6–23)
Calcium: 8.3 mg/dL — ABNORMAL LOW (ref 8.4–10.5)
GFR calc Af Amer: 53 mL/min — ABNORMAL LOW (ref >90)
Glucose, Bld: 106 mg/dL — ABNORMAL HIGH (ref 70–99)
Potassium: 3.7 meq/L (ref 3.7–5.3)
Sodium: 139 meq/L (ref 137–147)

## 2013-05-08 LAB — CBC WITH DIFFERENTIAL/PLATELET
Basophils Absolute: 0 10*3/uL (ref 0.0–0.1)
Basophils Relative: 0 % (ref 0–1)
Eosinophils Absolute: 0.1 10*3/uL (ref 0.0–0.7)
Eosinophils Relative: 2 % (ref 0–5)
HCT: 26.8 % — ABNORMAL LOW (ref 36.0–46.0)
Hemoglobin: 8.9 g/dL — ABNORMAL LOW (ref 12.0–15.0)
Lymphocytes Relative: 35 % (ref 12–46)
Lymphs Abs: 1.6 10*3/uL (ref 0.7–4.0)
MCH: 25.1 pg — ABNORMAL LOW (ref 26.0–34.0)
MCHC: 33.2 g/dL (ref 30.0–36.0)
MCV: 75.7 fL — ABNORMAL LOW (ref 78.0–100.0)
Monocytes Absolute: 0.4 K/uL (ref 0.1–1.0)
Monocytes Relative: 9 % (ref 3–12)
Neutro Abs: 2.4 K/uL (ref 1.7–7.7)
Neutrophils Relative %: 54 % (ref 43–77)
Platelets: 344 10*3/uL (ref 150–400)
RBC: 3.54 MIL/uL — ABNORMAL LOW (ref 3.87–5.11)
RDW: 13.9 % (ref 11.5–15.5)
WBC: 4.5 K/uL (ref 4.0–10.5)

## 2013-05-08 LAB — I-STAT TROPONIN, ED: Troponin i, poc: 0 ng/mL (ref 0.00–0.08)

## 2013-05-08 MED ORDER — HYDROCODONE-ACETAMINOPHEN 5-325 MG PO TABS
1.0000 | ORAL_TABLET | Freq: Once | ORAL | Status: AC
  Administered 2013-05-08: 1 via ORAL
  Filled 2013-05-08: qty 1

## 2013-05-08 MED ORDER — HYDROCODONE-ACETAMINOPHEN 5-325 MG PO TABS: 1.0000 | ORAL_TABLET | ORAL | Status: DC | PRN

## 2013-05-08 NOTE — ED Provider Notes (Signed)
CSN: [id number]     Arrival date & time 05/08/13  1450 History   First MD Initiated Contact with Patient 05/08/13 1530     Chief Complaint  Patient presents with  . Chest Pain     (Consider location/radiation/quality/duration/timing/severity/associated sxs/prior Treatment) Patient is a 38 y.o. female presenting with chest pain. The history is provided by the patient and medical records.  Chest Pain  This is a 37 year old female medical history significant for hypertension, diabetes, hypercholesterolemia, FSFG, presenting to the ED for chest pain.  Patient states she was sitting on her front porch and explained when she developed a sharp pain in the left side of her chest. Patient states pain is worse with deep breathing, coughing, and movement. Patient states she went to lay down, layed down on her left side which seemed to improve the pain somewhat but then developed left arm pain and paresthesias.  Pt endorses some nausea without vomiting but thinks it is due to her meds (pt was started on chemo 2 weeks ago for continued immune suppression).  Patient denies any shortness of breath, palpitations, dizziness, weakness, or feelings of syncope.  Patient has no prior history of PE or DVT.  No recent surgeries, travel, prolonged immobilization, LE edema, calf pain, or pain with ambulation.  No recent cough, cold, congestion, fever, chills, or other URI sx.  VS stable on arrival.  Past Medical History  Diagnosis Date  . Hypertension   . Diabetes mellitus   . Acid reflux   . High cholesterol   . Renal disorder     kidney issues   Past Surgical History  Procedure Laterality Date  . Abdominal hysterectomy     Family History  Problem Relation Age of Onset  . Hypertension Mother   . Thyroid disease Mother   . Coronary artery disease Father   . Hypertension Father   . Diabetes Father    History  Substance Use Topics  . Smoking status: Never Smoker   . Smokeless tobacco: Not on file  .  Alcohol Use: No   OB History   Grav Para Term Preterm Abortions TAB SAB Ect Mult Living                 Review of Systems  Cardiovascular: Positive for chest pain.  All other systems reviewed and are negative.     Allergies  Nsaids  Home Medications   Prior to Admission medications   Medication Sig Start Date End Date Taking? Authorizing Provider  HYDROcodone-acetaminophen (NORCO/VICODIN) 5-325 MG per tablet Take 1 tablet by mouth every 4 (four) hours as needed. 02/23/13   Sinda Du, MD  LORazepam (ATIVAN) 0.5 MG tablet Take 0.5 mg by mouth every 6 (six) hours as needed. For anxiety 11/02/12   Historical Provider, MD  metolazone (ZAROXOLYN) 5 MG tablet Take 5 mg by mouth daily.    Historical Provider, MD  Multiple Vitamin (MULTIVITAMIN WITH MINERALS) TABS tablet Take 1 tablet by mouth daily.    Historical Provider, MD  omeprazole (PRILOSEC) 40 MG capsule Take 40 mg by mouth daily.      Historical Provider, MD  ondansetron (ZOFRAN) 4 MG tablet Take 1 tablet (4 mg total) by mouth every 6 (six) hours. 02/23/13   Sinda Du, MD  simvastatin (ZOCOR) 10 MG tablet Take 10 mg by mouth at bedtime.    Historical Provider, MD  tacrolimus (PROGRAF) 0.5 MG capsule Take 1 mg by mouth 2 (two) times daily.    Historical Provider, MD  torsemide (DEMADEX) 20 MG tablet Take 20 mg by mouth 2 (two) times daily.    Historical Provider, MD  vitamin C (ASCORBIC ACID) 500 MG tablet Take 500 mg by mouth daily.    Historical Provider, MD   BP 127/72  Pulse 73  Temp(Src) 98.8 F (37.1 C) (Oral)  Resp 18  Wt 251 lb 7 oz (114.051 kg)  SpO2 100%  Physical Exam  Nursing note and vitals reviewed. Constitutional: She is oriented to person, place, and time. She appears well-developed and well-nourished. No distress.  HENT:  Head: Normocephalic and atraumatic.  Mouth/Throat: Oropharynx is clear and moist.  Eyes: Conjunctivae and EOM are normal. Pupils are equal, round, and reactive to light.  Neck: Normal  range of motion. Neck supple.  Cardiovascular: Normal rate, regular rhythm and normal heart sounds.   Pulmonary/Chest: Effort normal and breath sounds normal. No accessory muscle usage. Not tachypneic. No respiratory distress. She has no decreased breath sounds. She has no wheezes. She has no rhonchi.  Chest pain not reproducible with palpation to chest wall; respirations unlabored, lungs clear bilaterally; speaking in full complete sentences without difficulty  Abdominal: Soft. Bowel sounds are normal. There is no tenderness. There is no guarding.  Musculoskeletal: Normal range of motion. She exhibits no edema.  Trace pitting edema BLE  Neurological: She is alert and oriented to person, place, and time.  Skin: Skin is warm and dry. She is not diaphoretic.  Psychiatric: She has a normal mood and affect.    ED Course  Procedures (including critical care time) Labs Review Labs Reviewed  COMPREHENSIVE METABOLIC PANEL - Abnormal; Notable for the following:    Glucose, Bld 106 (*)    BUN 36 (*)    Creatinine, Ser 1.43 (*)    Calcium 8.3 (*)    Total Protein 5.0 (*)    Albumin 1.3 (*)    Total Bilirubin <0.2 (*)    GFR calc non Af Amer 45 (*)    GFR calc Af Amer 53 (*)    All other components within normal limits  CBC WITH DIFFERENTIAL - Abnormal; Notable for the following:    RBC 3.54 (*)    Hemoglobin 8.9 (*)    HCT 26.8 (*)    MCV 75.7 (*)    MCH 25.1 (*)    All other components within normal limits  I-STAT TROPOININ, ED    Imaging Review Dg Chest 2 View  05/08/2013   CLINICAL DATA:  Chest pain  EXAM: CHEST  2 VIEW  COMPARISON:  Prior radiograph from 02/23/2013  FINDINGS: The cardiac and mediastinal silhouettes are stable in size and contour, and remain within normal limits.  The lungs are normally inflated. No airspace consolidation, pleural effusion, or pulmonary edema is identified. There is no pneumothorax.  No acute osseous abnormality identified.  IMPRESSION: No acute  cardiopulmonary abnormality.   Electronically Signed   By: Jeannine Boga M.D.   On: 05/08/2013 15:29     EKG Interpretation   Date/Time:  Monday May 08 2013 15:00:00 EDT Ventricular Rate:  74 PR Interval:  152 QRS Duration: 86 QT Interval:  406 QTC Calculation: 450 R Axis:   59 Text Interpretation:  Normal sinus rhythm Normal ECG Confirmed by [facility]   MD, MICHEAL ([id number]) on 05/08/2013 4:17:08 PM      MDM   Final diagnoses:  Chest pain   38 y.o. F presenting with chief complain of chest pain.  On initial evaluation, pt appears comfortable and is  in no acute distress. Her vital signs are stable without tachycardia or hypoxia to suggest PE. On exam, her chest pain is nonreproducible that sounds pleuritic in nature.  Will obtain EKG, CXR, labs, trop.  Will reassess.  EKG sinus rhythm without ischemic change. Troponin is negative. Labs with chronic renal insufficiency, improved from previous visit.  Chest x-ray is clear.  On review of medical records, pt had almost identical visit and chief complaint in February of this year.  She had a negative work-up at that time as well.  Pt continues to be comfortable appearing and in NAD.  She is PERC negative.  HEART score of 1.  At this time i have low suspicion for ACS, PE, dissection, or other acute cardiac event. Sx possibly due to recurrent pleurisy.  Pt given vicodin for pain with some relief, will discharge with the same.  Will FU with PCP closely.  Discussed plan with patient, he/she acknowledged understanding and agreed with plan of care.  Return precautions given for new or worsening symptoms.  Discussed case with Dr. Dorna Mai who agrees with assessment and plan of care.  Larene Pickett, PA-C 05/08/13 2309

## 2013-05-08 NOTE — Discharge Instructions (Signed)
Take the prescribed medication as directed for pain/discomfort. Follow-up with your primary care physician. Return to the ED for new or worsening symptoms.

## 2013-05-08 NOTE — ED Provider Notes (Signed)
Medical screening examination/treatment/procedure(s) were performed by non-physician practitioner and as supervising physician I was immediately available for consultation/collaboration.   EKG Interpretation   Date/Time:  Monday May 08 2013 15:00:00 EDT Ventricular Rate:  74 PR Interval:  152 QRS Duration: 86 QT Interval:  406 QTC Calculation: 450 R Axis:   59 Text Interpretation:  Normal sinus rhythm Normal ECG Confirmed by [facility]   MD, MICHEAL ([id number]) on 05/08/2013 4:17:08 PM        Saddie Benders. Dorna Mai, MD 05/08/13 [phone]

## 2013-05-08 NOTE — ED Notes (Addendum)
Pt reports central chest pain radiating to arm; numbness in left arm, mild nausea. Worse with deep breath and started today. Taking chemo for kidney disease. Pt also reports she is more swollen today that has been.

## 2013-05-24 ENCOUNTER — Emergency Department (HOSPITAL_COMMUNITY)
Admission: EM | Admit: 2013-05-24 | Discharge: 2013-05-24 | Disposition: A | Discharge status: Home / Self Care | Payer: Medicaid Other | Attending: Emergency Medicine | Admitting: Emergency Medicine

## 2013-05-24 ENCOUNTER — Encounter (HOSPITAL_COMMUNITY): Payer: Self-pay | Admitting: Emergency Medicine

## 2013-05-24 DIAGNOSIS — K219 Gastro-esophageal reflux disease without esophagitis: Secondary | ICD-10-CM | POA: Insufficient documentation

## 2013-05-24 DIAGNOSIS — E860 Dehydration: Secondary | ICD-10-CM | POA: Insufficient documentation

## 2013-05-24 DIAGNOSIS — I10 Essential (primary) hypertension: Secondary | ICD-10-CM | POA: Insufficient documentation

## 2013-05-24 DIAGNOSIS — Z87442 Personal history of urinary calculi: Secondary | ICD-10-CM | POA: Insufficient documentation

## 2013-05-24 DIAGNOSIS — E785 Hyperlipidemia, unspecified: Secondary | ICD-10-CM | POA: Insufficient documentation

## 2013-05-24 DIAGNOSIS — Z79899 Other long term (current) drug therapy: Secondary | ICD-10-CM | POA: Insufficient documentation

## 2013-05-24 DIAGNOSIS — E119 Type 2 diabetes mellitus without complications: Secondary | ICD-10-CM | POA: Insufficient documentation

## 2013-05-24 DIAGNOSIS — R55 Syncope and collapse: Secondary | ICD-10-CM

## 2013-05-24 LAB — COMPREHENSIVE METABOLIC PANEL
ALK PHOS: 47 U/L (ref 39–117)
AST: 23 U/L (ref 0–37)
Albumin: 1.2 g/dL — ABNORMAL LOW (ref 3.5–5.2)
BUN: 36 mg/dL — AB (ref 6–23)
CALCIUM: 8.3 mg/dL — AB (ref 8.4–10.5)
CO2: 21 meq/L (ref 19–32)
Chloride: 103 mEq/L (ref 96–112)
Creatinine, Ser: 2.43 mg/dL — ABNORMAL HIGH (ref 0.50–1.10)
GFR, EST AFRICAN AMERICAN: 28 mL/min — AB (ref >90)
GFR, EST NON AFRICAN AMERICAN: 24 mL/min — AB (ref >90)
Glucose, Bld: 103 mg/dL — ABNORMAL HIGH (ref 70–99)
Potassium: 3.6 mEq/L — ABNORMAL LOW (ref 3.7–5.3)
Sodium: 139 mEq/L (ref 137–147)
TOTAL PROTEIN: 5.2 g/dL — AB (ref 6.0–8.3)
Total Bilirubin: <0.2 mg/dL — ABNORMAL LOW (ref 0.3–1.2)

## 2013-05-24 LAB — URINE MICROSCOPIC-ADD ON

## 2013-05-24 LAB — URINALYSIS, ROUTINE W REFLEX MICROSCOPIC
Bilirubin Urine: NEGATIVE
GLUCOSE, UA: 250 mg/dL — AB
KETONES UR: NEGATIVE mg/dL
Leukocytes, UA: NEGATIVE
Nitrite: NEGATIVE
Protein, ur: >300 mg/dL — AB
Specific Gravity, Urine: 1.023 (ref 1.005–1.030)
Urobilinogen, UA: 0.2 mg/dL (ref 0.0–1.0)
pH: 6.5 (ref 5.0–8.0)

## 2013-05-24 LAB — CBC
HEMATOCRIT: 28 % — AB (ref 36.0–46.0)
HEMOGLOBIN: 9.5 g/dL — AB (ref 12.0–15.0)
MCH: 25.4 pg — AB (ref 26.0–34.0)
MCHC: 33.9 g/dL (ref 30.0–36.0)
MCV: 74.9 fL — AB (ref 78.0–100.0)
Platelets: 410 10*3/uL — ABNORMAL HIGH (ref 150–400)
RBC: 3.74 MIL/uL — AB (ref 3.87–5.11)
RDW: 13.8 % (ref 11.5–15.5)
WBC: 4.5 10*3/uL (ref 4.0–10.5)

## 2013-05-24 LAB — I-STAT TROPONIN, ED: Troponin i, poc: 0 ng/mL (ref 0.00–0.08)

## 2013-05-24 LAB — CBG MONITORING, ED: Glucose-Capillary: 102 mg/dL — ABNORMAL HIGH (ref 70–99)

## 2013-05-24 MED ORDER — SODIUM CHLORIDE 0.9 % IV BOLUS (SEPSIS)
1000.0000 mL | Freq: Once | INTRAVENOUS | Status: AC
  Administered 2013-05-24: 1000 mL via INTRAVENOUS

## 2013-05-24 NOTE — Discharge Instructions (Signed)
Your Creatnine today was 2.3.  You have fluctuated between 1.4 and 2.6 since January 2015. I recommended she drink plenty of water and have your kidney function rechecked within the next week. Please avoid sodas or drinks with caffeine. I feel you're episode of passing out today was secondary to being dehydrated.   Dehydration, Adult Dehydration is when you lose more fluids from the body than you take in. Vital organs like the kidneys, brain, and heart cannot function without a proper amount of fluids and salt. Any loss of fluids from the body can cause dehydration.  CAUSES   Vomiting.  Diarrhea.  Excessive sweating.  Excessive urine output.  Fever. SYMPTOMS  Mild dehydration  Thirst.  Dry lips.  Slightly dry mouth. Moderate dehydration  Very dry mouth.  Sunken eyes.  Skin does not bounce back quickly when lightly pinched and released.  Dark urine and decreased urine production.  Decreased tear production.  Headache. Severe dehydration  Very dry mouth.  Extreme thirst.  Rapid, weak pulse (more than 100 beats per minute at rest).  Cold hands and feet.  Not able to sweat in spite of heat and temperature.  Rapid breathing.  Blue lips.  Confusion and lethargy.  Difficulty being awakened.  Minimal urine production.  No tears. DIAGNOSIS  Your caregiver will diagnose dehydration based on your symptoms and your exam. Blood and urine tests will help confirm the diagnosis. The diagnostic evaluation should also identify the cause of dehydration. TREATMENT  Treatment of mild or moderate dehydration can often be done at home by increasing the amount of fluids that you drink. It is best to drink small amounts of fluid more often. Drinking too much at one time can make vomiting worse. Refer to the home care instructions below. Severe dehydration needs to be treated at the hospital where you will probably be given intravenous (IV) fluids that contain water and  electrolytes. HOME CARE INSTRUCTIONS   Ask your caregiver about specific rehydration instructions.  Drink enough fluids to keep your urine clear or pale yellow.  Drink small amounts frequently if you have nausea and vomiting.  Eat as you normally do.  Avoid:  Foods or drinks high in sugar.  Carbonated drinks.  Juice.  Extremely hot or cold fluids.  Drinks with caffeine.  Fatty, greasy foods.  Alcohol.  Tobacco.  Overeating.  Gelatin desserts.  Wash your hands well to avoid spreading bacteria and viruses.  Only take over-the-counter or prescription medicines for pain, discomfort, or fever as directed by your caregiver.  Ask your caregiver if you should continue all prescribed and over-the-counter medicines.  Keep all follow-up appointments with your caregiver. SEEK MEDICAL CARE IF:  You have abdominal pain and it increases or stays in one area (localizes).  You have a rash, stiff neck, or severe headache.  You are irritable, sleepy, or difficult to awaken.  You are weak, dizzy, or extremely thirsty. SEEK IMMEDIATE MEDICAL CARE IF:   You are unable to keep fluids down or you get worse despite treatment.  You have frequent episodes of vomiting or diarrhea.  You have blood or green matter (bile) in your vomit.  You have blood in your stool or your stool looks black and tarry.  You have not urinated in 6 to 8 hours, or you have only urinated a small amount of very dark urine.  You have a fever.  You faint. MAKE SURE YOU:   Understand these instructions.  Will watch your condition.  Will get  help right away if you are not doing well or get worse. Document Released: 12/22/2004 Document Revised: 03/16/2011 Document Reviewed: 08/11/2010 [facility] Patient Information 2014 [place], [place].  Rehydration, Adult Rehydration is the replacement of body fluids lost during dehydration. Dehydration is an extreme loss of body fluids to the point of body function  impairment. There are many ways extreme fluid loss can occur, including vomiting, diarrhea, or excess sweating. Recovering from dehydration requires replacing lost fluids, continuing to eat to maintain strength, and avoiding foods and beverages that may contribute to further fluid loss or may increase nausea. HOW TO REHYDRATE In most cases, rehydration involves the replacement of not only fluids but also carbohydrates and basic body salts. Rehydration with an oral rehydration solution is one way to replace essential nutrients lost through dehydration. An oral rehydration solution can be purchased at pharmacies, retail stores, and online. Premixed packets of powder that you combine with water to make a solution are also sold. You can prepare an oral rehydration solution at home by mixing the following ingredients together:      tsp table salt.   tsp baking soda.   tsp salt substitute containing potassium chloride.  1 tablespoons sugar.  1 L (34 oz) of water. Be sure to use exact measurements. Including too much sugar can make diarrhea worse. Drink  1 cup (120 240 mL) of oral rehydration solution each time you have diarrhea or vomit. If drinking this amount makes your vomiting worse, try drinking smaller amounts more often. For example, drink 1 3 tsp every 5 10 minutes.  A general rule for staying hydrated is to drink 1 2 L of fluid per day. Talk to your caregiver about the specific amount you should be drinking each day. Drink enough fluids to keep your urine clear or pale yellow. EATING WHEN DEHYDRATED Even if you have had severe sweating or you are having diarrhea, do not stop eating. Many healthy items in a normal diet are okay to continue eating while recovering from dehydration. The following tips can help you to lessen nausea when you eat:  Ask someone else to prepare your food. Cooking smells may worsen nausea.  Eat in a well-ventilated room away from cooking smells.  Sit up when you  eat. Avoid lying down until 1 2 hours after eating.  Eat small amounts when you eat.  Eat foods that are easy to digest. These include soft, well-cooked, or mashed foods. FOODS AND BEVERAGES TO AVOID Avoid eating or drinking the following foods and beverages that may increase nausea or further loss of fluid:   Fruit juices with a high sugar content, such as concentrated juices.  Alcohol.  Beverages containing caffeine.  Carbonated drinks. They may cause a lot of gas.  Foods that may cause a lot of gas, such as cabbage, broccoli, and beans.  Fatty, greasy, and fried foods.  Spicy, very salty, and very sweet foods or drinks.  Foods or drinks that are very hot or very cold. Consume food or drinks at or near room temperature.  Foods that need a lot of chewing, such as raw vegetables.  Foods that are sticky or hard to swallow, such as peanut butter. Document Released: 03/16/2011 Document Revised: 09/16/2011 Document Reviewed: 03/16/2011 [facility] Patient Information 2014 [place].  Syncope Syncope is a fainting spell. This means the person loses consciousness and drops to the ground. The person is generally unconscious for less than 5 minutes. The person may have some muscle twitches for up to  15 seconds before waking up and returning to normal. Syncope occurs more often in elderly people, but it can happen to anyone. While most causes of syncope are not dangerous, syncope can be a sign of a serious medical problem. It is important to seek medical care.  CAUSES  Syncope is caused by a sudden decrease in blood flow to the brain. The specific cause is often not determined. Factors that can trigger syncope include:  Taking medicines that lower blood pressure.  Sudden changes in posture, such as standing up suddenly.  Taking more medicine than prescribed.  Standing in one place for too long.  Seizure disorders.  Dehydration and excessive exposure to heat.  Low blood sugar  (hypoglycemia).  Straining to have a bowel movement.  Heart disease, irregular heartbeat, or other circulatory problems.  Fear, emotional distress, seeing blood, or severe pain. SYMPTOMS  Right before fainting, you may:  Feel dizzy or lightheaded.  Feel nauseous.  See all white or all black in your field of vision.  Have cold, clammy skin. DIAGNOSIS  Your caregiver will ask about your symptoms, perform a physical exam, and perform electrocardiography (ECG) to record the electrical activity of your heart. Your caregiver may also perform other heart or blood tests to determine the cause of your syncope. TREATMENT  In most cases, no treatment is needed. Depending on the cause of your syncope, your caregiver may recommend changing or stopping some of your medicines. HOME CARE INSTRUCTIONS  Have someone stay with you until you feel stable.  Do not drive, operate machinery, or play sports until your caregiver says it is okay.  Keep all follow-up appointments as directed by your caregiver.  Lie down right away if you start feeling like you might faint. Breathe deeply and steadily. Wait until all the symptoms have passed.  Drink enough fluids to keep your urine clear or pale yellow.  If you are taking blood pressure or heart medicine, get up slowly, taking several minutes to sit and then stand. This can reduce dizziness. SEEK IMMEDIATE MEDICAL CARE IF:   You have a severe headache.  You have unusual pain in the chest, abdomen, or back.  You are bleeding from the mouth or rectum, or you have black or tarry stool.  You have an irregular or very fast heartbeat.  You have pain with breathing.  You have repeated fainting or seizure-like jerking during an episode.  You faint when sitting or lying down.  You have confusion.  You have difficulty walking.  You have severe weakness.  You have vision problems. If you fainted, call your local emergency services (911 in [place] Do  not drive yourself to the hospital.  MAKE SURE YOU:  Understand these instructions.  Will watch your condition.  Will get help right away if you are not doing well or get worse. Document Released: 12/22/2004 Document Revised: 06/23/2011 Document Reviewed: 02/20/2011 [facility] Patient Information 2014 [place].

## 2013-05-24 NOTE — ED Notes (Signed)
Patient arrives via EMS due to near syncope at a friend's house NO LOC, did not hit head Patient given Zofran 4 mg IV by EMS for c/o nausea--no actual vomiting Patient arrives alert and oriented x 4 upon arrival to ED

## 2013-05-24 NOTE — ED Notes (Signed)
Patient gets chemo once a month for nephrotic syndrome

## 2013-05-24 NOTE — ED Notes (Signed)
Pt states that everything went black and her body became heavy and she can't remember exactly what happened, pt now complains of abd pain

## 2013-05-24 NOTE — ED Provider Notes (Signed)
TIME SEEN: 8:00 PM  CHIEF COMPLAINT: Syncope  HPI: Patient is a 38 y.o. 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 Patient reports that had just and felt very lightheaded, heart and felt palpitations. She denies any chest pain or shortness of breath. She sat back down and then states that she passed out in family reports she was unconscious for only several seconds. There is no incontinence, tongue biting or postictal state. No seizure-like activity reported by her daughter who witnessed the event. She states she has had a syncopal event in the past with negative workup. She is followed by Dr. Ulice Dash with [facility] nephrology. She states that she now feels that her body is "heavy" all over and she cannot lift her bilateral arms and her legs.  ROS: See HPI Constitutional: no fever  Eyes: no drainage  ENT: no runny nose   Cardiovascular:  no chest pain  Resp: no SOB  GI: no vomiting GU: no dysuria Integumentary: no rash  Allergy: no hives  Musculoskeletal: no leg swelling  Neurological: no slurred speech ROS otherwise negative  PAST MEDICAL HISTORY/PAST SURGICAL HISTORY:  Past Medical History  Diagnosis Date  . Hypertension   . Diabetes mellitus   . Acid reflux   . High cholesterol   . Renal disorder     kidney issues    MEDICATIONS:  Prior to Admission medications   Medication Sig Start Date End Date Taking? Authorizing Provider  HYDROcodone-acetaminophen (NORCO/VICODIN) 5-325 MG per tablet Take 1 tablet by mouth every 4 (four) hours as needed. 02/23/13   Sinda Du, MD  HYDROcodone-acetaminophen (NORCO/VICODIN) 5-325 MG per tablet Take 1 tablet by mouth every 4 (four) hours as needed. 05/08/13   Larene Pickett, PA-C  LORazepam (ATIVAN) 0.5 MG tablet Take 0.5 mg by mouth every 6 (six) hours as needed.  For anxiety 11/02/12   Historical Provider, MD  metolazone (ZAROXOLYN) 5 MG tablet Take 5 mg by mouth daily.    Historical Provider, MD  Multiple Vitamin (MULTIVITAMIN WITH MINERALS) TABS tablet Take 1 tablet by mouth daily.    Historical Provider, MD  omeprazole (PRILOSEC) 40 MG capsule Take 40 mg by mouth daily.      Historical Provider, MD  ondansetron (ZOFRAN) 4 MG tablet Take 1 tablet (4 mg total) by mouth every 6 (six) hours. 02/23/13   Sinda Du, MD  simvastatin (ZOCOR) 10 MG tablet Take 10 mg by mouth at bedtime.    Historical Provider, MD  tacrolimus (PROGRAF) 0.5 MG capsule Take 1 mg by mouth 2 (two) times daily.    Historical Provider, MD  torsemide (DEMADEX) 20 MG tablet Take 20 mg by mouth 2 (two) times daily.    Historical Provider, MD  vitamin C (ASCORBIC ACID) 500 MG tablet Take 500 mg by mouth daily.    Historical Provider, MD    ALLERGIES:  Allergies  Allergen Reactions  . Nsaids     Kidney disease    SOCIAL HISTORY:  History  Substance Use Topics  . Smoking status: Never Smoker   . Smokeless tobacco: Not on file  . Alcohol Use: No    FAMILY HISTORY: Family History  Problem Relation Age of Onset  . Hypertension Mother   . Thyroid disease Mother   . Coronary artery disease Father   . Hypertension Father   . Diabetes  Father     EXAM: BP 125/76  Pulse 82  Temp(Src) 98.8 F (37.1 C) (Oral)  Resp 15  Ht $R'5\' 7"'dl$  (1.702 m)  Wt 240 lb (108.863 kg)  BMI 37.58 kg/m2  SpO2 100% CONSTITUTIONAL: Alert and oriented and responds appropriately to questions. Well-appearing; well-nourished HEAD: Normocephalic EYES: Conjunctivae clear, PERRL ENT: normal nose; no rhinorrhea; moist mucous membranes; pharynx without lesions noted NECK: Supple, no meningismus, no LAD  CARD: RRR; S1 and S2 appreciated; no murmurs, no clicks, no rubs, no gallops RESP: Normal chest excursion without splinting or tachypnea; breath sounds clear and equal bilaterally; no wheezes, no rhonchi, no  rales,  ABD/GI: Normal bowel sounds; non-distended; soft, non-tender, no rebound, no guarding BACK:  The back appears normal and is non-tender to palpation, there is no CVA tenderness EXT: Normal ROM in all joints; non-tender to palpation; no edema; normal capillary refill; no cyanosis    SKIN: Normal color for age and race; warm NEURO: Moves all extremities equally, sensation to light touch intact diffusely, cranial nerves II through XII intact, when patient is asked to point to something on her abdomen she is able to move her upper extremities without any difficulty but when asked to raise them above her head she states she can because "they are too heavy". PSYCH: The patient's mood and manner are appropriate. Grooming and personal hygiene are appropriate.  MEDICAL DECISION MAKING: Patient here with syncopal event. She does have risk factors for ACS but no history of arrhythmia or heart failure. She states she has not been eating or drinking well recently due to her treatment for her minimal change disease. We'll check basic labs, troponin. Her EKG shows no new ischemic changes. We'll also obtain urinalysis. Patient is status post abdominal hysterectomy. We'll check orthostatic vital signs. Have discussed with patient I do not feel this episode today was a seizure have not concerned she is having a stroke given her "heaviness" of her arms and legs is bilateral and she is awake and oriented.  ED PROGRESS: Patient reports feeling much better with IV fluids. Her creatinine is slightly elevated at 2.34 but she has been here this in the past. She states that her creatnine was last checked a week ago and was 1.7. Per patient's records, her creatinine we'll fluctuate between 1.4 and 2.6 since January 2015. Suspect patient is dehydrated and then a syncopal event was secondary orthostasis. I feel she is safe to go home after IV fluids. We'll have her followup with her nephrologist. Urine shows hematuria and  proteinuria but this is chronic. No sign of infection. Have instructed patient to have her creatinine rechecked at the end of this week or beginning of next. Family reports the patient only drinks soda. Have advised her to drink water and avoid drinks with caffeine. Have discussed return precautions. She verbalizes understanding and is comfortable plan.      EKG Interpretation  Date/Time:  Wednesday May 24 2013 19:49:25 EDT Ventricular Rate:  87 PR Interval:  152 QRS Duration: 93 QT Interval:  371 QTC Calculation: 446 R Axis:   32 Text Interpretation:  Sinus rhythm Borderline T abnormalities, anterior leads Confirmed by Abram Sax,  DO, Waldon Sheerin [phone]) on 05/24/2013 7:52:37 PM         [place], DO 05/24/13 [id number]

## 2013-06-29 ENCOUNTER — Emergency Department (HOSPITAL_COMMUNITY): Payer: Medicaid Other

## 2013-06-29 ENCOUNTER — Observation Stay (HOSPITAL_COMMUNITY)
Admission: EM | Admit: 2013-06-29 | Discharge: 2013-07-01 | Disposition: A | Discharge status: Home / Self Care | Payer: Medicaid Other | Attending: Internal Medicine | Admitting: Internal Medicine

## 2013-06-29 ENCOUNTER — Encounter (HOSPITAL_COMMUNITY): Payer: Self-pay | Admitting: Emergency Medicine

## 2013-06-29 DIAGNOSIS — E119 Type 2 diabetes mellitus without complications: Secondary | ICD-10-CM | POA: Insufficient documentation

## 2013-06-29 DIAGNOSIS — I151 Hypertension secondary to other renal disorders: Secondary | ICD-10-CM

## 2013-06-29 DIAGNOSIS — N051 Unspecified nephritic syndrome with focal and segmental glomerular lesions: Secondary | ICD-10-CM

## 2013-06-29 DIAGNOSIS — K219 Gastro-esophageal reflux disease without esophagitis: Secondary | ICD-10-CM | POA: Insufficient documentation

## 2013-06-29 DIAGNOSIS — N059 Unspecified nephritic syndrome with unspecified morphologic changes: Secondary | ICD-10-CM | POA: Diagnosis present

## 2013-06-29 DIAGNOSIS — R079 Chest pain, unspecified: Secondary | ICD-10-CM | POA: Insufficient documentation

## 2013-06-29 DIAGNOSIS — I10 Essential (primary) hypertension: Secondary | ICD-10-CM | POA: Diagnosis present

## 2013-06-29 DIAGNOSIS — R0602 Shortness of breath: Secondary | ICD-10-CM | POA: Insufficient documentation

## 2013-06-29 DIAGNOSIS — I129 Hypertensive chronic kidney disease with stage 1 through stage 4 chronic kidney disease, or unspecified chronic kidney disease: Secondary | ICD-10-CM | POA: Insufficient documentation

## 2013-06-29 DIAGNOSIS — Z90710 Acquired absence of both cervix and uterus: Secondary | ICD-10-CM | POA: Insufficient documentation

## 2013-06-29 DIAGNOSIS — R509 Fever, unspecified: Secondary | ICD-10-CM

## 2013-06-29 DIAGNOSIS — N170 Acute kidney failure with tubular necrosis: Secondary | ICD-10-CM

## 2013-06-29 DIAGNOSIS — N2889 Other specified disorders of kidney and ureter: Secondary | ICD-10-CM

## 2013-06-29 DIAGNOSIS — F411 Generalized anxiety disorder: Secondary | ICD-10-CM | POA: Insufficient documentation

## 2013-06-29 DIAGNOSIS — D649 Anemia, unspecified: Principal | ICD-10-CM | POA: Diagnosis present

## 2013-06-29 DIAGNOSIS — R609 Edema, unspecified: Secondary | ICD-10-CM

## 2013-06-29 DIAGNOSIS — R300 Dysuria: Secondary | ICD-10-CM | POA: Insufficient documentation

## 2013-06-29 DIAGNOSIS — R110 Nausea: Secondary | ICD-10-CM

## 2013-06-29 DIAGNOSIS — E7800 Pure hypercholesterolemia, unspecified: Secondary | ICD-10-CM | POA: Insufficient documentation

## 2013-06-29 DIAGNOSIS — N189 Chronic kidney disease, unspecified: Secondary | ICD-10-CM | POA: Insufficient documentation

## 2013-06-29 LAB — URINALYSIS, ROUTINE W REFLEX MICROSCOPIC
Bilirubin Urine: NEGATIVE
Glucose, UA: 250 mg/dL — AB
Ketones, ur: NEGATIVE mg/dL
Leukocytes, UA: NEGATIVE
NITRITE: NEGATIVE
Protein, ur: >300 mg/dL — AB
Specific Gravity, Urine: 1.018 (ref 1.005–1.030)
Urobilinogen, UA: 0.2 mg/dL (ref 0.0–1.0)
pH: 7 (ref 5.0–8.0)

## 2013-06-29 LAB — CBC
HCT: 23.9 % — ABNORMAL LOW (ref 36.0–46.0)
HEMOGLOBIN: 7.9 g/dL — AB (ref 12.0–15.0)
MCH: 24.9 pg — ABNORMAL LOW (ref 26.0–34.0)
MCHC: 33.1 g/dL (ref 30.0–36.0)
MCV: 75.4 fL — AB (ref 78.0–100.0)
Platelets: 331 10*3/uL (ref 150–400)
RBC: 3.17 MIL/uL — ABNORMAL LOW (ref 3.87–5.11)
RDW: 14.2 % (ref 11.5–15.5)
WBC: 4.4 10*3/uL (ref 4.0–10.5)

## 2013-06-29 LAB — BASIC METABOLIC PANEL
BUN: 41 mg/dL — ABNORMAL HIGH (ref 6–23)
CO2: 22 meq/L (ref 19–32)
Calcium: 7.8 mg/dL — ABNORMAL LOW (ref 8.4–10.5)
Chloride: 104 mEq/L (ref 96–112)
Creatinine, Ser: 1.61 mg/dL — ABNORMAL HIGH (ref 0.50–1.10)
GFR calc Af Amer: 46 mL/min — ABNORMAL LOW (ref >90)
GFR calc non Af Amer: 39 mL/min — ABNORMAL LOW (ref >90)
GLUCOSE: 113 mg/dL — AB (ref 70–99)
POTASSIUM: 3.5 meq/L — AB (ref 3.7–5.3)
Sodium: 139 mEq/L (ref 137–147)

## 2013-06-29 LAB — I-STAT TROPONIN, ED: Troponin i, poc: 0 ng/mL (ref 0.00–0.08)

## 2013-06-29 LAB — PRO B NATRIURETIC PEPTIDE: Pro B Natriuretic peptide (BNP): 313.9 pg/mL — ABNORMAL HIGH (ref 0–125)

## 2013-06-29 LAB — URINE MICROSCOPIC-ADD ON

## 2013-06-29 LAB — POC OCCULT BLOOD, ED: Fecal Occult Bld: NEGATIVE

## 2013-06-29 MED ORDER — METOLAZONE 5 MG PO TABS
5.0000 mg | ORAL_TABLET | Freq: Every day | ORAL | Status: DC
  Administered 2013-06-30 – 2013-07-01 (×2): 5 mg via ORAL
  Filled 2013-06-29 (×2): qty 1

## 2013-06-29 MED ORDER — SODIUM CHLORIDE 0.9 % IJ SOLN: 3.0000 mL | INTRAMUSCULAR | Status: DC | PRN

## 2013-06-29 MED ORDER — SIMVASTATIN 10 MG PO TABS
10.0000 mg | ORAL_TABLET | Freq: Every day | ORAL | Status: DC
  Administered 2013-06-29 – 2013-07-01 (×2): 10 mg via ORAL
  Filled 2013-06-29 (×4): qty 1

## 2013-06-29 MED ORDER — LORAZEPAM 0.5 MG PO TABS: 0.5000 mg | ORAL_TABLET | Freq: Four times a day (QID) | ORAL | Status: DC | PRN

## 2013-06-29 MED ORDER — ONDANSETRON HCL 4 MG PO TABS: 4.0000 mg | ORAL_TABLET | Freq: Four times a day (QID) | ORAL | Status: DC | PRN

## 2013-06-29 MED ORDER — SODIUM CHLORIDE 0.9 % IJ SOLN
3.0000 mL | Freq: Two times a day (BID) | INTRAMUSCULAR | Status: DC
  Administered 2013-06-30 – 2013-07-01 (×3): 3 mL via INTRAVENOUS

## 2013-06-29 MED ORDER — ONDANSETRON HCL 4 MG/2ML IJ SOLN: 4.0000 mg | Freq: Four times a day (QID) | INTRAMUSCULAR | Status: DC | PRN

## 2013-06-29 MED ORDER — VITAMIN C 500 MG PO TABS
500.0000 mg | ORAL_TABLET | Freq: Every day | ORAL | Status: DC
  Administered 2013-06-30 – 2013-07-01 (×2): 500 mg via ORAL
  Filled 2013-06-29 (×2): qty 1

## 2013-06-29 MED ORDER — SODIUM CHLORIDE 0.9 % IV SOLN: 250.0000 mL | INTRAVENOUS | Status: DC | PRN

## 2013-06-29 MED ORDER — ENOXAPARIN SODIUM 30 MG/0.3ML ~~LOC~~ SOLN
30.0000 mg | SUBCUTANEOUS | Status: DC
Start: 2013-06-29 — End: 2013-07-01
  Administered 2013-06-29 – 2013-06-30 (×2): 30 mg via SUBCUTANEOUS
  Filled 2013-06-29 (×3): qty 0.3

## 2013-06-29 MED ORDER — PROMETHAZINE HCL 25 MG/ML IJ SOLN
25.0000 mg | Freq: Once | INTRAMUSCULAR | Status: AC
  Administered 2013-06-29: 25 mg via INTRAVENOUS
  Filled 2013-06-29: qty 1

## 2013-06-29 MED ORDER — PANTOPRAZOLE SODIUM 40 MG PO TBEC
40.0000 mg | DELAYED_RELEASE_TABLET | Freq: Every day | ORAL | Status: DC
  Administered 2013-06-30 – 2013-07-01 (×2): 40 mg via ORAL
  Filled 2013-06-29 (×2): qty 1

## 2013-06-29 MED ORDER — ADULT MULTIVITAMIN W/MINERALS CH
1.0000 | ORAL_TABLET | Freq: Every day | ORAL | Status: DC
  Administered 2013-06-30 – 2013-07-01 (×2): 1 via ORAL
  Filled 2013-06-29 (×2): qty 1

## 2013-06-29 MED ORDER — TORSEMIDE 20 MG PO TABS
20.0000 mg | ORAL_TABLET | Freq: Two times a day (BID) | ORAL | Status: DC
  Administered 2013-06-29 – 2013-07-01 (×4): 20 mg via ORAL
  Filled 2013-06-29 (×7): qty 1

## 2013-06-29 MED ORDER — PROMETHAZINE HCL 25 MG PO TABS: 25.0000 mg | ORAL_TABLET | Freq: Four times a day (QID) | ORAL | Status: DC | PRN

## 2013-06-29 NOTE — ED Provider Notes (Signed)
Medical screening examination/treatment/procedure(s) were performed by non-physician practitioner and as supervising physician I was immediately available for consultation/collaboration.   EKG Interpretation   Date/Time:  Thursday June 29 2013 14:20:52 EDT Ventricular Rate:  94 PR Interval:  144 QRS Duration: 88 QT Interval:  368 QTC Calculation: 460 R Axis:   67 Text Interpretation:  Normal sinus rhythm Nonspecific T wave abnormality  Prolonged QT No significant change since last tracing Confirmed by  Maryan Rued  MD, Loree Fee ([id number]) on 06/29/2013 4:11:09 PM        Blanchie Dessert, MD 06/29/13 2353

## 2013-06-29 NOTE — ED Notes (Signed)
POC occult blood collected by Kenton Kingfisher A PA

## 2013-06-29 NOTE — Progress Notes (Signed)
Patricia [name] [id number] Admitted to 5W 23: 06/29/2013 7:30 PM Attending Renetta Suman: Geradine Girt, DO    [name] is a 38 y.o. female patient admitted from ED awake, alert  & orientated  X 3,  Full Code, VSS - Blood pressure 123/79, pulse 68, temperature 98.4 F (36.9 C), temperature source Oral, resp. rate 18, height $RemoveBe'5\' 6"'gEgFQInaT$  (1.676 m), weight 114.397 kg (252 lb 3.2 oz), SpO2 100.00%.,  R/A, no c/o shortness of breath, no c/o chest pain, no distress noted. Tele # [phone] placed and pt is currently running:normal sinus rhythm with continuous pulse ox.   IV site WDL:  antecubital left, condition patent and no redness with a transparent dsg that's clean dry and intact, with blood infusing.  Allergies:   Allergies  Allergen Reactions  . Nsaids     Kidney disease     Past Medical History  Diagnosis Date  . Hypertension   . Diabetes mellitus   . Acid reflux   . High cholesterol   . Renal disorder     kidney issues     Pt orientation to unit, room and routine. Information packet given to patient/family.  Admission INP armband ID verified with patient/family, and in place. SR up x 2.  Pt verbalizes an understanding of how to use the call bell and to call for help before getting out of bed.  Skin, clean-dry- intact without evidence of bruising, or skin tears.   No evidence of skin break down noted on exam.    Will cont to monitor and assist as needed.  Patricia Hose, RN 06/29/2013 7:30 PM

## 2013-06-29 NOTE — H&P (Addendum)
[facility] History and Physical  Dajha Urquilla ZOX:[id number] DOB: [birth year] DOA: 06/29/2013  Referring physician: er PCP: Tereasa Coop, PA-C   Chief Complaint: fatigue/LE swelling  HPI: Patricia [name] is a 38 y.o. 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 Patient has had several days of fatigue. This has been increasing swelling in her LE. She felt extremely short of breath with exertion and that she had some pressure on her chest she denies any chest pain, fevers, chills, diaphoresis. She has had some mild associated nausea and has not been able to eat well.  c/o burning with urination- U/A pending   In the ER, her Hgb was found to be low.  ER PA spoke with kidney doctor at [place] said: last hemoglobin was noted to be 8.7 on June 16. She was given a cyclophosphamide transfusion 2 weeks ago which he feels dropped her hemoglobin significantly. Her kidney function appears to be stable and do not feel is the cause of her edema. This may be related to decreased oncotic pressure within the vasculature. Dr. Ulice Dash agrees that she needs to have transfusion for symptomatic anemia and this should also help diurese her. The patient has a followup appointment with Dr. Ulice Dash on July 2.     Review of Systems:  All systems reviewed, negative unless stated above    Past Medical History  Diagnosis Date  . Hypertension   . Diabetes mellitus   . Acid reflux   . High cholesterol   . Renal disorder     kidney issues   Past Surgical History  Procedure Laterality Date  . Abdominal hysterectomy     Social History:  reports that she has never smoked. She does not have any smokeless tobacco history on file. She reports that she does not drink alcohol or use illicit drugs.  Allergies  Allergen Reactions  . Nsaids     Kidney disease    Family History  Problem Relation Age of Onset  . Hypertension Mother   . Thyroid disease  Mother   . Coronary artery disease Father   . Hypertension Father   . Diabetes Father      Prior to Admission medications   Medication Sig Start Date End Date Taking? Authorizing Provider  diphenhydramine-acetaminophen (TYLENOL PM) 25-500 MG TABS Take 1 tablet by mouth at bedtime as needed (sleep).   Yes Historical Provider, MD  LORazepam (ATIVAN) 0.5 MG tablet Take 0.5 mg by mouth every 6 (six) hours as needed for anxiety.   Yes Historical Provider, MD  metolazone (ZAROXOLYN) 5 MG tablet Take 5 mg by mouth daily.   Yes Historical Provider, MD  Multiple Vitamin (MULTIVITAMIN WITH MINERALS) TABS tablet Take 1 tablet by mouth daily.   Yes Historical Provider, MD  omeprazole (PRILOSEC) 40 MG capsule Take 40 mg by mouth daily.     Yes Historical Provider, MD  promethazine (PHENERGAN) 25 MG tablet Take 25 mg by mouth every 6 (six) hours as needed for nausea or vomiting.   Yes Historical Provider, MD  simvastatin (ZOCOR) 10 MG tablet Take 10 mg by mouth at bedtime.   Yes Historical Provider, MD  torsemide (DEMADEX) 20 MG tablet Take 20 mg by mouth 2 (two) times daily.   Yes Historical Provider, MD  vitamin C (ASCORBIC ACID) 500 MG tablet Take 500 mg by mouth daily.   Yes Historical Provider, MD   Physical Exam: Filed Vitals:   06/29/13 1755  BP: 120/57  Pulse:   Temp:   Resp: 18    BP 120/57  Pulse 81  Temp(Src) 98.7 F (37.1 C)  Resp 18  Wt 115.384 kg (254 lb 6 oz)  SpO2 100%  General:  Appears calm and comfortable Eyes: PERRL, normal lids, irises & conjunctiva ENT: grossly normal hearing, lips & tongue Neck: no LAD, masses or thyromegaly Cardiovascular: RRR, no m/r/g. 3+pitting edema Respiratory: CTA bilaterally, no w/r/r. Normal respiratory effort. Abdomen: soft, ntnd Skin: no rash or induration seen on limited exam Musculoskeletal: grossly normal tone BUE/BLE Psychiatric: grossly normal mood and affect, speech fluent and appropriate Neurologic: grossly non-focal.            Labs on Admission:  Basic Metabolic Panel:  Recent Labs Lab 06/29/13 1426  NA 139  K 3.5*  CL 104  CO2 22  GLUCOSE 113*  BUN 41*  CREATININE 1.61*  CALCIUM 7.8*   Liver Function Tests: No results found for this basename: AST, ALT, ALKPHOS, BILITOT, PROT, ALBUMIN,  in the last 168 hours No results found for this basename: LIPASE, AMYLASE,  in the last 168 hours No results found for this basename: AMMONIA,  in the last 168 hours CBC:  Recent Labs Lab 06/29/13 1426  WBC 4.4  HGB 7.9*  HCT 23.9*  MCV 75.4*  PLT 331   Cardiac Enzymes: No results found for this basename: CKTOTAL, CKMB, CKMBINDEX, TROPONINI,  in the last 168 hours  BNP (last 3 results)  Recent Labs  01/27/13 2157 06/29/13 1426  PROBNP 262.9* 313.9*   CBG: No results found for this basename: GLUCAP,  in the last 168 hours  Radiological Exams on Admission: Dg Chest 2 View  06/29/2013   CLINICAL DATA:  Shortness of breath, chest pain  EXAM: CHEST  2 VIEW  COMPARISON:  05/08/2013  FINDINGS: The heart size and mediastinal contours are within normal limits. Both lungs are clear. The visualized skeletal structures are unremarkable.  IMPRESSION: No active cardiopulmonary disease.   Electronically Signed   By: Kathreen Devoid   On: 06/29/2013 15:21    EKG: Independently reviewed. 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 Similar sounding words can inadvertently be transcribed and this note may contain transcription errors which may not have been corrected upon publication of note.**

## 2013-06-29 NOTE — ED Notes (Signed)
Pt complaining of extremity swelling over the past few days. sts that today she started having some chest pressure and SOB with exertion. sts hard to lay flat.

## 2013-06-29 NOTE — ED Provider Notes (Signed)
CSN: [id number]     Arrival date & time 06/29/13  1415 History   First MD Initiated Contact with Patient 06/29/13 1535     Chief Complaint  Patient presents with  . Shortness of Breath  . Chest Pain     (Consider location/radiation/quality/duration/timing/severity/associated sxs/prior Treatment) HPI Patricia [name] is a(n) 38 y.o. female who presents to the emergency department with chief complaint of shortness of breath. 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 Patient has had several days of fatigue. This has been increasing swelling. She felt extremely short of breath with exertion and that she had some pressure on her chest she denies any chest pain, fevers, chills, diaphoresis. She has had some mild associated nausea. She states that she isn't taking her medications as directed. She denies any bleeding. She is status post hysterectomy. She denies any melena or hematochezia. She does feel a racing heart when she stands and walks. She has a history of anemia. She states that her last check she was about 9 on her hemoglobin. She is formerly been on injections.    Past Medical History  Diagnosis Date  . Hypertension   . Diabetes mellitus   . Acid reflux   . High cholesterol   . Renal disorder     kidney issues   Past Surgical History  Procedure Laterality Date  . Abdominal hysterectomy     Family History  Problem Relation Age of Onset  . Hypertension Mother   . Thyroid disease Mother   . Coronary artery disease Father   . Hypertension Father   . Diabetes Father    History  Substance Use Topics  . Smoking status: Never Smoker   . Smokeless tobacco: Not on file  . Alcohol Use: No   OB History   Grav Para Term Preterm Abortions TAB SAB Ect Mult Living                 Review of Systems  Ten systems reviewed and are negative for acute change, except as noted in the HPI.    Allergies   Nsaids  Home Medications   Prior to Admission medications   Medication Sig Start Date End Date Taking? Authorizing Provider  diphenhydramine-acetaminophen (TYLENOL PM) 25-500 MG TABS Take 1 tablet by mouth at bedtime as needed (sleep).   Yes Historical Provider, MD  LORazepam (ATIVAN) 0.5 MG tablet Take 0.5 mg by mouth every 6 (six) hours as needed for anxiety.   Yes Historical Provider, MD  metolazone (ZAROXOLYN) 5 MG tablet Take 5 mg by mouth daily.   Yes Historical Provider, MD  Multiple Vitamin (MULTIVITAMIN WITH MINERALS) TABS tablet Take 1 tablet by mouth daily.   Yes Historical Provider, MD  omeprazole (PRILOSEC) 40 MG capsule Take 40 mg by mouth daily.     Yes Historical Provider, MD  promethazine (PHENERGAN) 25 MG tablet Take 25 mg by mouth every 6 (six) hours as needed for nausea or vomiting.   Yes Historical Provider, MD  simvastatin (ZOCOR) 10 MG tablet Take 10 mg by mouth at bedtime.   Yes Historical Provider, MD  torsemide (DEMADEX) 20 MG tablet Take 20 mg by mouth 2 (two) times daily.   Yes Historical Provider, MD  vitamin C (ASCORBIC ACID) 500 MG tablet Take 500 mg by mouth daily.   Yes Historical Provider, MD   BP 123/70  Pulse 80  Temp(Src) 98.7 F (37.1 C)  Resp 22  Wt  254 lb 6 oz (115.384 kg)  SpO2 100% Physical Exam  Nursing note and vitals reviewed. Constitutional: She is oriented to person, place, and time. She appears well-developed and well-nourished.  Appears very fatigued  HENT:  Head: Normocephalic and atraumatic.  Eyes: No scleral icterus.  Pale conjunctiva bilaterally  Neck: Normal range of motion. Neck supple. No JVD present.  Cardiovascular: Normal rate, regular rhythm and normal heart sounds.  Exam reveals no gallop and no friction rub.   No murmur heard. Bilateral 2+ pitting edema up to the knee. Trace pitting edema in bilateral upper extremities no JVD  Pulmonary/Chest: Effort normal and breath sounds normal. No respiratory distress.   Abdominal: Soft. Bowel sounds are normal. She exhibits no distension and no mass. There is no tenderness. There is no guarding.  Genitourinary:  Digital Rectal Exam reveals sphincter with good tone. No external hemorrhoids. No masses or fissures. Stool color is brown with no overt blood.   Neurological: She is alert and oriented to person, place, and time.  Skin: Skin is warm and dry.    ED Course  Procedures (including critical care time) Labs Review Labs Reviewed  CBC - Abnormal; Notable for the following:    RBC 3.17 (*)    Hemoglobin 7.9 (*)    HCT 23.9 (*)    MCV 75.4 (*)    MCH 24.9 (*)    All other components within normal limits  BASIC METABOLIC PANEL - Abnormal; Notable for the following:    Potassium 3.5 (*)    Glucose, Bld 113 (*)    BUN 41 (*)    Creatinine, Ser 1.61 (*)    Calcium 7.8 (*)    GFR calc non Af Amer 39 (*)    GFR calc Af Amer 46 (*)    All other components within normal limits  PRO B NATRIURETIC PEPTIDE - Abnormal; Notable for the following:    Pro B Natriuretic peptide (BNP) 313.9 (*)    All other components within normal limits  I-STAT TROPOININ, ED    Imaging Review Dg Chest 2 View  06/29/2013   CLINICAL DATA:  Shortness of breath, chest pain  EXAM: CHEST  2 VIEW  COMPARISON:  05/08/2013  FINDINGS: The heart size and mediastinal contours are within normal limits. Both lungs are clear. The visualized skeletal structures are unremarkable.  IMPRESSION: No active cardiopulmonary disease.   Electronically Signed   By: Elige Ko   On: 06/29/2013 15:21     EKG Interpretation   Date/Time:  Thursday June 29 2013 14:20:52 EDT Ventricular Rate:  94 PR Interval:  144 QRS Duration: 88 QT Interval:  368 QTC Calculation: 460 R Axis:   67 Text Interpretation:  Normal sinus rhythm Nonspecific T wave abnormality  Prolonged QT No significant change since last tracing Confirmed by  Anitra Lauth  MD, Alphonzo Lemmings ([id number]) on 06/29/2013 4:11:09 PM      CRITICAL  CARE Performed by: Arthor Captain   Total critical care time: 40  Critical care time was exclusive of separately billable procedures and treating other patients.  Critical care was necessary to treat or prevent imminent or life-threatening deterioration.  Critical care was time spent personally by me on the following activities: development of treatment plan with patient and/or surrogate as well as nursing, discussions with consultants, evaluation of patient's response to treatment, examination of patient, obtaining history from patient or surrogate, ordering and performing treatments and interventions, ordering and review of laboratory studies, ordering and review of radiographic studies,  pulse oximetry and re-evaluation of patient's condition.   MDM   Final diagnoses:  Symptomatic anemia    5:48 PM Patient here for her shortness of breath. Some chest pressure. Her hemoglobin was found to be 7.9. Prolonged discussion with her nephrologist at [facility]. The patient's last hemoglobin was noted to be 8.7 on June 16. She was given a cyclophosphamide transfusion 2 weeks ago which he feels dropped her hemoglobin significantly. Her kidney function appears to be stable and I do not feel is the cause of her edema. This may be related to decreased oncotic pressure within the vasculature. Dr. Ulice Dash agrees that she needs to have transfusion for symptomatic anemia and this should also help diurese her. Patient has agreed to plan of care I have ordered 2 units for transfusion. The patient has a followup appointment with Dr. Ulice Dash on July 2. Her EKG is not concerning for ischemia. Negative troponin. Chest x-ray is unremarkable. I have spoken to Dr. Janett Billow and he will admit the patient for observation and blood transfusion.   I personally reviewed the imaging tests through PACS system. I have reviewed and interpreted Lab values. I reviewed available ER/hospitalization records through the EMR   The  patient appears reasonably stabilized for admission considering the current resources, flow, and capabilities available in the ED at this time, and I doubt any other [facility] requiring further screening and/or treatment in the ED prior to admission.    Margarita Mail, PA-C 06/29/13 1751

## 2013-06-30 LAB — COMPREHENSIVE METABOLIC PANEL
ALT: 10 U/L (ref 0–35)
AST: 25 U/L (ref 0–37)
Albumin: 1.2 g/dL — ABNORMAL LOW (ref 3.5–5.2)
Alkaline Phosphatase: 37 U/L — ABNORMAL LOW (ref 39–117)
BUN: 43 mg/dL — ABNORMAL HIGH (ref 6–23)
CO2: 21 mEq/L (ref 19–32)
Calcium: 7.5 mg/dL — ABNORMAL LOW (ref 8.4–10.5)
Chloride: 106 mEq/L (ref 96–112)
Creatinine, Ser: 1.64 mg/dL — ABNORMAL HIGH (ref 0.50–1.10)
GFR calc Af Amer: 45 mL/min — ABNORMAL LOW (ref >90)
GFR calc non Af Amer: 38 mL/min — ABNORMAL LOW (ref >90)
Glucose, Bld: 98 mg/dL (ref 70–99)
Potassium: 3.5 mEq/L — ABNORMAL LOW (ref 3.7–5.3)
Sodium: 140 mEq/L (ref 137–147)
Total Bilirubin: 0.2 mg/dL — ABNORMAL LOW (ref 0.3–1.2)
Total Protein: 4.4 g/dL — ABNORMAL LOW (ref 6.0–8.3)

## 2013-06-30 LAB — CBC WITH DIFFERENTIAL/PLATELET
Basophils Absolute: 0 10*3/uL (ref 0.0–0.1)
Basophils Relative: 1 % (ref 0–1)
EOS PCT: 4 % (ref 0–5)
Eosinophils Absolute: 0.2 10*3/uL (ref 0.0–0.7)
HEMATOCRIT: 30.6 % — AB (ref 36.0–46.0)
HEMOGLOBIN: 10.5 g/dL — AB (ref 12.0–15.0)
LYMPHS ABS: 1.6 10*3/uL (ref 0.7–4.0)
LYMPHS PCT: 27 % (ref 12–46)
MCH: 26.6 pg (ref 26.0–34.0)
MCHC: 34.3 g/dL (ref 30.0–36.0)
MCV: 77.7 fL — AB (ref 78.0–100.0)
MONOS PCT: 10 % (ref 3–12)
Monocytes Absolute: 0.6 10*3/uL (ref 0.1–1.0)
Neutro Abs: 3.4 10*3/uL (ref 1.7–7.7)
Neutrophils Relative %: 58 % (ref 43–77)
PLATELETS: 321 10*3/uL (ref 150–400)
RBC: 3.94 MIL/uL (ref 3.87–5.11)
RDW: 14.5 % (ref 11.5–15.5)
WBC: 5.8 10*3/uL (ref 4.0–10.5)

## 2013-06-30 LAB — CBC
HEMATOCRIT: 27.4 % — AB (ref 36.0–46.0)
Hemoglobin: 9.3 g/dL — ABNORMAL LOW (ref 12.0–15.0)
MCH: 26.5 pg (ref 26.0–34.0)
MCHC: 33.9 g/dL (ref 30.0–36.0)
MCV: 78.1 fL (ref 78.0–100.0)
Platelets: 269 10*3/uL (ref 150–400)
RBC: 3.51 MIL/uL — ABNORMAL LOW (ref 3.87–5.11)
RDW: 14.4 % (ref 11.5–15.5)
WBC: 4.2 10*3/uL (ref 4.0–10.5)

## 2013-06-30 LAB — PREPARE RBC (CROSSMATCH)

## 2013-06-30 LAB — URINE CULTURE: Colony Count: 40000

## 2013-06-30 LAB — TROPONIN I: Troponin I: <0.3 ng/mL (ref <0.30)

## 2013-06-30 LAB — PHOSPHORUS: PHOSPHORUS: 3.8 mg/dL (ref 2.3–4.6)

## 2013-06-30 LAB — MAGNESIUM: Magnesium: 1.5 mg/dL (ref 1.5–2.5)

## 2013-06-30 LAB — ABO/RH: ABO/RH(D): O NEG

## 2013-06-30 MED ORDER — FUROSEMIDE 10 MG/ML IJ SOLN
20.0000 mg | Freq: Once | INTRAMUSCULAR | Status: AC
Start: 2013-06-30 — End: 2013-06-30
  Administered 2013-06-30: 20 mg via INTRAVENOUS
  Filled 2013-06-30: qty 2

## 2013-06-30 MED ORDER — POTASSIUM CHLORIDE 20 MEQ/15ML (10%) PO LIQD
40.0000 meq | Freq: Once | ORAL | Status: AC
  Administered 2013-06-30: 40 meq via ORAL
  Filled 2013-06-30: qty 30

## 2013-06-30 MED ORDER — FUROSEMIDE 10 MG/ML IJ SOLN
20.0000 mg | Freq: Once | INTRAMUSCULAR | Status: AC
  Administered 2013-06-30: 20 mg via INTRAVENOUS
  Filled 2013-06-30: qty 2

## 2013-06-30 MED ORDER — SIMETHICONE 80 MG PO CHEW
80.0000 mg | CHEWABLE_TABLET | Freq: Four times a day (QID) | ORAL | Status: DC | PRN
  Administered 2013-07-01: 80 mg via ORAL
  Filled 2013-06-30 (×2): qty 1

## 2013-06-30 NOTE — Progress Notes (Signed)
On call physician paged for med for abdominal cramps per pt request.

## 2013-06-30 NOTE — Care Management Note (Signed)
    Page 1 of 1   06/30/2013     6:34:09 PM CARE MANAGEMENT NOTE 06/30/2013  Patient:  Patricia [name],Patricia [name]   Account Number:  [id number]  Date Initiated:  06/30/2013  Documentation initiated by:  Tomi Bamberger  Subjective/Objective Assessment:   dx sob, chest pain, anemia  admit as observation     Action/Plan:   Anticipated DC Date:  07/01/2013   Anticipated DC Plan:  [place]  CM consult      Choice offered to / List presented to:             Status of service:  In process, will continue to follow Medicare Important Message given?   (If response is "NO", the following Medicare IM given date fields will be blank) Date Medicare IM given:   Date Additional Medicare IM given:    Discharge Disposition:    Per UR Regulation:    If discussed at Long Length of Stay Meetings, dates discussed:    Comments:

## 2013-06-30 NOTE — Progress Notes (Signed)
Patient received 2 units of blood- patient denies any SOB, chills, pain, or fever. 2 hour CBC post check will be ordered.

## 2013-06-30 NOTE — Progress Notes (Signed)
Utilization review completed.  

## 2013-06-30 NOTE — Discharge Instructions (Signed)
Please keep you follow up appointment with Dr. Ulice Dash on 7/2.  Take care.

## 2013-06-30 NOTE — Progress Notes (Signed)
At shift change pt. Informed RN that she has been having some abd. Cramping and had 3 loose stools in last hour.  Pt. To be d/c after blood transfusion today.  Texted paged Rogue Bussing, NP to informed of above and received orders to cancel d/c for tonight and get stool for c-diff.  Pt. Placed on contact and informed of plan.  Will continue to monitor. Alphonzo Lemmings, RN

## 2013-06-30 NOTE — Discharge Summary (Signed)
Physician Discharge Summary  Patricia [name] PXT:[id number] DOB: [birth year] DOA: 06/29/2013  PCP: Tereasa Coop, PA-C  Admit date: 06/29/2013 Discharge date: 06/30/2013  Time spent:45 minutes  Recommendations for Outpatient Follow-up:  1. Recheck CBC in 1 week to ensure anemia has resolved and Hgb remains stable 2. Recheck BUN, Cr, and albumin in 1 week to monitor kidney function and low albumin. Patient started on lasix PRN. 3. Follow up with Renal at [facility] for ongoing care of minimal change disease and likely nephrotic state.  Discharge Diagnoses:  Principal Problem:   Symptomatic anemia Active Problems:   Morbid obesity   HTN (hypertension)   Glomerulonephritis   Edema   Nausea   Discharge Condition: Stable   Diet recommendation: General, heart healthy  Filed Weights   06/29/13 1421 06/29/13 1924 06/30/13 0415  Weight: 115.384 kg (254 lb 6 oz) 114.397 kg (252 lb 3.2 oz) 114.306 kg (252 lb)    History of present illness:  Patricia [name] is a 38 y.o. 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 Patient has had several days of fatigue, increasing swelling in her LE, significant SOB with exertion and "some pressure on her chest". She denies any chest pain, fevers, chills, diaphoresis. Pt notes she had some mild associated nausea and has not been able to eat well. Pt also complains of burning with urination.  In the ER, her Hgb was found to be 7.9. ER PA spoke with kidney doctor at [facility], informed that last hemoglobin was noted to be 8.7 on June 16. She was given a cyclophosphamide transfusion 2 weeks ago which he feels dropped her hemoglobin significantly. Her kidney function appears to be stable and he does not feel this is the cause of her edema. This may be related to decreased oncotic pressure within the vasculature. Dr. Ulice Dash agrees that she needs to have transfusion for symptomatic anemia  and this should also help diurese her. The patient has a followup appointment with Dr. Ulice Dash on July 2.    Hospital Course:   Symptomatic anemia Recurrent, last hgb check 8.7 (6/16) Hgb 7.9 upon admission Transfused 2 units PRBC, hgb 9.3 this am Hbg goal is 10 (per Dr. Ulice Dash at [facility]), transfused 3rd unit to achieve goal  Chest pain. Patient admitted with chest pressure.  This morning she is having intermittent stabbing left chest pain. Troponin is WNL.  EKG is normal, 2D echo in January was within normal limits. Doubt cardiac etiology.  Continue omeprazole.  Dysuria U/A does not show infection, Cx pending.  Low albumin Albumin 1.2 this am, appears to be at baseline  LE edema Secondary to low albumin. Will give IV lasix with blood transfusions.  She is on demadex and metolazone at home. Kidney function appears stable 2D echo in January 2015 is negative for CHF. Patient counseled on Low Salt diet by nutrition.  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 Ulice Dash at [facility]  Patient is on Ace-I, Statin, low salt diet.  Nausea Zofran PRN, try to limit phenergan due to QTC: 460  Discharge Exam: Filed Vitals:   06/30/13 1351  BP: 128/85  Pulse: 69  Temp: 97.9 F (36.6 C)  Resp: 18   General: Well-developed, well-nourished, lying in bed in NAD HEENT: PERRL, normal lids, irises & conjunctiva, MMM  Neck: supple, no masses, no lymphadenopahy Cardiovascular: RRR, S1 S2, no m/r/g.  Respiratory: CTAB,  normal respiratory effort, no wheezes Abdomen: soft, +BS, non-tender, non-distended Skin: no rashes or exudates Musculoskeletal: 2+pitting edema in lower legs bilaterally, strength 5/5 in lower extremities Psychiatric: grossly normal mood and affect, speech fluent and appropriate  Neurologic: grossly non-focal.       Discharge Instructions   Diet - low sodium heart healthy    Complete by:  As directed              Medication List         diphenhydramine-acetaminophen 25-500 MG Tabs  Commonly known as:  TYLENOL PM  Take 1 tablet by mouth at bedtime as needed (sleep).     LORazepam 0.5 MG tablet  Commonly known as:  ATIVAN  Take 0.5 mg by mouth every 6 (six) hours as needed for anxiety.     metolazone 5 MG tablet  Commonly known as:  ZAROXOLYN  Take 5 mg by mouth daily.     multivitamin with minerals Tabs tablet  Take 1 tablet by mouth daily.     omeprazole 40 MG capsule  Commonly known as:  PRILOSEC  Take 40 mg by mouth daily.     promethazine 25 MG tablet  Commonly known as:  PHENERGAN  Take 25 mg by mouth every 6 (six) hours as needed for nausea or vomiting.     simvastatin 10 MG tablet  Commonly known as:  ZOCOR  Take 10 mg by mouth at bedtime.     torsemide 20 MG tablet  Commonly known as:  DEMADEX  Take 20 mg by mouth 2 (two) times daily.     vitamin C 500 MG tablet  Commonly known as:  ASCORBIC ACID  Take 500 mg by mouth daily.       Allergies  Allergen Reactions  . Nsaids     Kidney disease      The results of significant diagnostics from this hospitalization (including imaging, microbiology, ancillary and laboratory) are listed below for reference.    Significant Diagnostic Studies: Dg Chest 2 View  06/29/2013   CLINICAL DATA:  Shortness of breath, chest pain  EXAM: CHEST  2 VIEW  COMPARISON:  05/08/2013  FINDINGS: The heart size and mediastinal contours are within normal limits. Both lungs are clear. The visualized skeletal structures are unremarkable.  IMPRESSION: No active cardiopulmonary disease.   Electronically Signed   By: Kathreen Devoid   On: 06/29/2013 15:21    Labs: Basic Metabolic Panel:  Recent Labs Lab 06/29/13 1426 06/30/13 0659  NA 139 140  K 3.5* 3.5*  CL 104 106  CO2 22 21  GLUCOSE 113* 98  BUN 41* 43*  CREATININE 1.61* 1.64*  CALCIUM 7.8* 7.5*  MG  --  1.5  PHOS  --  3.8   Liver Function Tests:  Recent Labs Lab 06/30/13 0659   AST 25  ALT 10  ALKPHOS 37*  BILITOT 0.2*  PROT 4.4*  ALBUMIN 1.2*   CBC:  Recent Labs Lab 06/29/13 1426 06/30/13 0659  WBC 4.4 4.2  HGB 7.9* 9.3*  HCT 23.9* 27.4*  MCV 75.4* 78.1  PLT 331 269   BNP: BNP (last 3 results)  Recent Labs  01/27/13 2157 06/29/13 1426  PROBNP 262.9* 313.9*      Signed:  Tammi Klippel, PA-S [place], [place] [phone] [facility] 06/30/2013, 1:58 PM   Addendum  Patient seen and examined, chart and data base reviewed.  I agree with the above assessment and plan.  Patient having CP with atypical features.  Has had 2 negative troponins over the last 24 hours. EKG did not reveal ischemic changes. I suspect GERD, discharge on PPI Received 3 units of PRBC's

## 2013-06-30 NOTE — Progress Notes (Signed)
Nutrition Education Note  RD consulted for nutrition education regarding a Heart Healthy diet. RD provided diet education to patient. She follows a low sodium diet at home. We reviewed the nutrition label and discussed importance of reading for sodium. Pt verbalized understanding and appreciative of RD visit.  Lipid Panel     Component Value Date/Time   CHOL  Value: 175        ATP III CLASSIFICATION:  <200     mg/dL   Desirable  200-239  mg/dL   Borderline High  >=240    mg/dL   High        02/26/2010 0330   TRIG 122 02/26/2010 0330   HDL 33* 02/26/2010 0330   CHOLHDL 5.3 02/26/2010 0330   VLDL 24 02/26/2010 0330   LDLCALC  Value: 118        Total Cholesterol/HDL:CHD Risk Coronary Heart Disease Risk Table                     Men   Women  1/2 Average Risk   3.4   3.3  Average Risk       5.0   4.4  2 X Average Risk   9.6   7.1  3 X Average Risk  23.4   11.0        Use the calculated Patient Ratio above and the CHD Risk Table to determine the patient's CHD Risk.        ATP III CLASSIFICATION (LDL):  <100     mg/dL   Optimal  100-129  mg/dL   Near or Above                    Optimal  130-159  mg/dL   Borderline  160-189  mg/dL   High  >190     mg/dL   Very High* 02/26/2010 0330    RD provided "Heart Healthy Nutrition Therapy" handout from the Academy of [employer]. Reviewed patient's dietary recall. Provided examples on ways to decrease sodium and fat intake in diet. Discouraged intake of processed foods and use of salt shaker. Encouraged fresh fruits and vegetables as well as whole grain sources of carbohydrates to maximize fiber intake. Teach back method used.  Expect good compliance.  Body mass index is 40.69 kg/(m^2). Pt meets criteria for Obese Class III based on current BMI.  Current diet order is Heart Healthy, patient is consuming approximately >50% of meals at this time. Labs and medications reviewed. No further nutrition interventions warranted at this time. RD contact information  provided. If additional nutrition issues arise, please re-consult RD.  Inda Coke MS, RD, LDN Inpatient Registered Dietitian Pager: [phone] After-hours pager: [phone]

## 2013-06-30 NOTE — Plan of Care (Signed)
Problem: Phase I Progression Outcomes Goal: Initial discharge plan identified Outcome: Completed/Met Date Met:  06/30/13 To return home

## 2013-07-01 DIAGNOSIS — R079 Chest pain, unspecified: Secondary | ICD-10-CM

## 2013-07-01 DIAGNOSIS — D649 Anemia, unspecified: Secondary | ICD-10-CM

## 2013-07-01 DIAGNOSIS — N032 Chronic nephritic syndrome with diffuse membranous glomerulonephritis: Secondary | ICD-10-CM

## 2013-07-01 DIAGNOSIS — I10 Essential (primary) hypertension: Secondary | ICD-10-CM

## 2013-07-01 DIAGNOSIS — I150 Renovascular hypertension: Secondary | ICD-10-CM

## 2013-07-01 LAB — TYPE AND SCREEN
ABO/RH(D): O NEG
ANTIBODY SCREEN: NEGATIVE
UNIT DIVISION: 0
Unit division: 0
Unit division: 0

## 2013-07-01 NOTE — Progress Notes (Signed)
D/C orders received. Pt educated on d/c instructions and given d/c packet. Verbalized understanding. IV removed. Pt taken downstairs by staff via wheelchair.

## 2013-07-01 NOTE — Progress Notes (Signed)
Subjective: Denies any diarrhea, denies abdominal pain or nausea/vomiting.  Objective: Filed Vitals:   07/01/13 0547  BP: 112/75  Pulse: 67  Temp: 98.1 F (36.7 C)  Resp: 16  General: Alert and awake, oriented x3, not in any acute distress. HEENT: anicteric sclera, pupils reactive to light and accommodation, EOMI CVS: S1-S2 clear, no murmur rubs or gallops Chest: clear to auscultation bilaterally, no wheezing, rales or rhonchi Abdomen: soft nontender, nondistended, normal bowel sounds, no organomegaly Extremities: no cyanosis, clubbing or edema noted bilaterally Neuro: Cranial nerves II-XII intact, no focal neurological deficits   Assessment and plan:  Symptomatic anemia  Recurrent, last hgb check 8.7 (6/16)  Hgb 7.9 upon admission  Transfused 2 units PRBC, hgb 9.3 this am  Hbg goal is 10 (per Dr. Ulice Dash at [facility]), transfused 3rd unit to achieve goal   Chest pain.  Patient admitted with chest pressure. This morning she is having intermittent stabbing left chest pain.  Troponin is WNL. EKG is normal, 2D echo in January was within normal limits.  Doubt cardiac etiology. Continue omeprazole.   Dysuria  U/A does not show infection, culture showed insignificant growth.   Low albumin  Albumin 1.2 this am, appears to be at baseline   LE edema  Secondary to low albumin.  Will give IV lasix with blood transfusions. She is on demadex and metolazone at home.  Kidney function appears stable  2D echo in January 2015 is negative for CHF.  Patient counseled on Low Salt diet by nutrition.   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 Ulice Dash in at [facility]. Continue ACE inhibitors and low salt diet, creatinine is 1.6 close to baseline.   Birdie Hopes Pager: [phone] 07/01/2013, 10:31 AM

## 2013-07-01 NOTE — Progress Notes (Signed)
UR Completed.  Vergie Living [phone] 07/01/2013

## 2013-07-05 ENCOUNTER — Encounter (HOSPITAL_COMMUNITY): Payer: Self-pay | Admitting: Emergency Medicine

## 2013-07-05 ENCOUNTER — Emergency Department (HOSPITAL_COMMUNITY)
Admission: EM | Admit: 2013-07-05 | Discharge: 2013-07-06 | Disposition: A | Discharge status: Home / Self Care | Payer: Medicaid Other | Attending: Emergency Medicine | Admitting: Emergency Medicine

## 2013-07-05 DIAGNOSIS — E119 Type 2 diabetes mellitus without complications: Secondary | ICD-10-CM | POA: Insufficient documentation

## 2013-07-05 DIAGNOSIS — E669 Obesity, unspecified: Secondary | ICD-10-CM | POA: Insufficient documentation

## 2013-07-05 DIAGNOSIS — R110 Nausea: Secondary | ICD-10-CM | POA: Insufficient documentation

## 2013-07-05 DIAGNOSIS — Z79899 Other long term (current) drug therapy: Secondary | ICD-10-CM | POA: Insufficient documentation

## 2013-07-05 DIAGNOSIS — N289 Disorder of kidney and ureter, unspecified: Secondary | ICD-10-CM | POA: Insufficient documentation

## 2013-07-05 DIAGNOSIS — D6489 Other specified anemias: Secondary | ICD-10-CM

## 2013-07-05 DIAGNOSIS — R5381 Other malaise: Secondary | ICD-10-CM | POA: Insufficient documentation

## 2013-07-05 DIAGNOSIS — I10 Essential (primary) hypertension: Secondary | ICD-10-CM | POA: Insufficient documentation

## 2013-07-05 DIAGNOSIS — E7800 Pure hypercholesterolemia, unspecified: Secondary | ICD-10-CM | POA: Insufficient documentation

## 2013-07-05 DIAGNOSIS — D649 Anemia, unspecified: Secondary | ICD-10-CM | POA: Insufficient documentation

## 2013-07-05 DIAGNOSIS — R5383 Other fatigue: Secondary | ICD-10-CM

## 2013-07-05 DIAGNOSIS — Z8719 Personal history of other diseases of the digestive system: Secondary | ICD-10-CM | POA: Insufficient documentation

## 2013-07-05 LAB — CBC WITH DIFFERENTIAL/PLATELET
Basophils Absolute: 0 10*3/uL (ref 0.0–0.1)
Basophils Relative: 0 % (ref 0–1)
Eosinophils Absolute: 0.1 10*3/uL (ref 0.0–0.7)
Eosinophils Relative: 2 % (ref 0–5)
HEMATOCRIT: 32.9 % — AB (ref 36.0–46.0)
Hemoglobin: 11.4 g/dL — ABNORMAL LOW (ref 12.0–15.0)
LYMPHS PCT: 38 % (ref 12–46)
Lymphs Abs: 1.9 10*3/uL (ref 0.7–4.0)
MCH: 27 pg (ref 26.0–34.0)
MCHC: 34.7 g/dL (ref 30.0–36.0)
MCV: 78 fL (ref 78.0–100.0)
MONO ABS: 0.4 10*3/uL (ref 0.1–1.0)
Monocytes Relative: 8 % (ref 3–12)
Neutro Abs: 2.6 10*3/uL (ref 1.7–7.7)
Neutrophils Relative %: 52 % (ref 43–77)
Platelets: 328 10*3/uL (ref 150–400)
RBC: 4.22 MIL/uL (ref 3.87–5.11)
RDW: 14.8 % (ref 11.5–15.5)
WBC: 5.1 10*3/uL (ref 4.0–10.5)

## 2013-07-05 LAB — COMPREHENSIVE METABOLIC PANEL
ALBUMIN: 1.2 g/dL — AB (ref 3.5–5.2)
ALK PHOS: 55 U/L (ref 39–117)
ALT: <5 U/L (ref 0–35)
ANION GAP: 11 (ref 5–15)
AST: <5 U/L (ref 0–37)
BUN: 36 mg/dL — AB (ref 6–23)
CHLORIDE: 102 meq/L (ref 96–112)
CO2: 25 mEq/L (ref 19–32)
Calcium: 8.3 mg/dL — ABNORMAL LOW (ref 8.4–10.5)
Creatinine, Ser: 1.54 mg/dL — ABNORMAL HIGH (ref 0.50–1.10)
GFR calc Af Amer: 48 mL/min — ABNORMAL LOW (ref >90)
GFR calc non Af Amer: 42 mL/min — ABNORMAL LOW (ref >90)
Glucose, Bld: 100 mg/dL — ABNORMAL HIGH (ref 70–99)
Potassium: 3.6 mEq/L — ABNORMAL LOW (ref 3.7–5.3)
Sodium: 138 mEq/L (ref 137–147)
Total Bilirubin: <0.2 mg/dL — ABNORMAL LOW (ref 0.3–1.2)
Total Protein: 4.8 g/dL — ABNORMAL LOW (ref 6.0–8.3)

## 2013-07-05 NOTE — ED Provider Notes (Signed)
CSN: [id number]     Arrival date & time 07/05/13  2117 History   First MD Initiated Contact with Patient 07/05/13 2336     Chief Complaint  Patient presents with  . Weakness  . Fatigue  . Nausea     (Consider location/radiation/quality/duration/timing/severity/associated sxs/prior Treatment) HPI This patient is a 38 yo woman who was released from this hospital 6d ago after admission for symptomatic anemia thought to be secondary to an adverse drug reaction. She received transfusion. She has followup with her primary care physician later this morning. However, she comes in because she has felt fatigued since her discharge. She says she felt more fatigued today than earlier in the week.  The patient claims that she is sleeping well at night. She says her appetite and by mouth intake have been normal. She has not had any fever, vomiting, diarrhea or any other symptoms. She has been compliant with prescribed medications.   Past Medical History  Diagnosis Date  . Hypertension   . Diabetes mellitus   . Acid reflux   . High cholesterol   . Renal disorder     kidney issues   Past Surgical History  Procedure Laterality Date  . Abdominal hysterectomy     Family History  Problem Relation Age of Onset  . Hypertension Mother   . Thyroid disease Mother   . Coronary artery disease Father   . Hypertension Father   . Diabetes Father    History  Substance Use Topics  . Smoking status: Never Smoker   . Smokeless tobacco: Not on file  . Alcohol Use: No   OB History   Grav Para Term Preterm Abortions TAB SAB Ect Mult Living                 Review of Systems Ten point review of symptoms performed and is negative with the exception of symptoms noted above.    Allergies  Nsaids  Home Medications   Prior to Admission medications   Medication Sig Start Date End Date Taking? Authorizing Provider  diphenhydramine-acetaminophen (TYLENOL PM) 25-500 MG TABS Take 1 tablet by mouth at  bedtime as needed (sleep).   Yes Historical Provider, MD  LORazepam (ATIVAN) 0.5 MG tablet Take 0.5 mg by mouth every 6 (six) hours as needed for anxiety.   Yes Historical Provider, MD  metolazone (ZAROXOLYN) 5 MG tablet Take 5 mg by mouth daily.   Yes Historical Provider, MD  Multiple Vitamin (MULTIVITAMIN WITH MINERALS) TABS tablet Take 1 tablet by mouth daily.   Yes Historical Provider, MD  omeprazole (PRILOSEC) 40 MG capsule Take 40 mg by mouth daily.     Yes Historical Provider, MD  promethazine (PHENERGAN) 25 MG tablet Take 25 mg by mouth every 6 (six) hours as needed for nausea or vomiting.   Yes Historical Provider, MD  simvastatin (ZOCOR) 10 MG tablet Take 10 mg by mouth at bedtime.   Yes Historical Provider, MD  torsemide (DEMADEX) 20 MG tablet Take 20 mg by mouth 2 (two) times daily.   Yes Historical Provider, MD  vitamin C (ASCORBIC ACID) 500 MG tablet Take 500 mg by mouth daily.   Yes Historical Provider, MD   BP 147/94  Pulse 58  Temp(Src) 98 F (36.7 C) (Oral)  Resp 16  Ht $R'5\' 7"'fW$  (1.702 m)  Wt 246 lb (111.585 kg)  BMI 38.52 kg/m2  SpO2 100% Physical Exam Gen: well developed and well nourished appearing Head: NCAT Eyes: PERL, EOMI Nose: no epistaixis  or rhinorrhea Mouth/throat: mucosa is moist and pink Neck: supple, no stridor Lungs: CTA B, no wheezing, rhonchi or rales CV: RRR, no murmur, extremities appear well perfused.  Abd: soft, notender, obese, nondistended Back: no ttp, no cva ttp Skin: warm and dry Ext: normal to inspection, no dependent edema Neuro: CN ii-xii grossly intact, no focal deficits, normal speech, normal gait  Psyche; normal affect,  calm and cooperative.   ED Course  Procedures (including critical care time) Labs Review  Results for orders placed during the hospital encounter of 07/05/13 (from the past 24 hour(s))  CBC WITH DIFFERENTIAL     Status: Abnormal   Collection Time    07/05/13  9:25 PM      Result Value Ref Range   WBC 5.1  4.0  - 10.5 K/uL   RBC 4.22  3.87 - 5.11 MIL/uL   Hemoglobin 11.4 (*) 12.0 - 15.0 g/dL   HCT 32.9 (*) 36.0 - 46.0 %   MCV 78.0  78.0 - 100.0 fL   MCH 27.0  26.0 - 34.0 pg   MCHC 34.7  30.0 - 36.0 g/dL   RDW 14.8  11.5 - 15.5 %   Platelets 328  150 - 400 K/uL   Neutrophils Relative % 52  43 - 77 %   Neutro Abs 2.6  1.7 - 7.7 K/uL   Lymphocytes Relative 38  12 - 46 %   Lymphs Abs 1.9  0.7 - 4.0 K/uL   Monocytes Relative 8  3 - 12 %   Monocytes Absolute 0.4  0.1 - 1.0 K/uL   Eosinophils Relative 2  0 - 5 %   Eosinophils Absolute 0.1  0.0 - 0.7 K/uL   Basophils Relative 0  0 - 1 %   Basophils Absolute 0.0  0.0 - 0.1 K/uL  COMPREHENSIVE METABOLIC PANEL     Status: Abnormal   Collection Time    07/05/13  9:25 PM      Result Value Ref Range   Sodium 138  137 - 147 mEq/L   Potassium 3.6 (*) 3.7 - 5.3 mEq/L   Chloride 102  96 - 112 mEq/L   CO2 25  19 - 32 mEq/L   Glucose, Bld 100 (*) 70 - 99 mg/dL   BUN 36 (*) 6 - 23 mg/dL   Creatinine, Ser 1.54 (*) 0.50 - 1.10 mg/dL   Calcium 8.3 (*) 8.4 - 10.5 mg/dL   Total Protein 4.8 (*) 6.0 - 8.3 g/dL   Albumin 1.2 (*) 3.5 - 5.2 g/dL   AST <5  0 - 37 U/L   ALT <5  0 - 35 U/L   Alkaline Phosphatase 55  39 - 117 U/L   Total Bilirubin <0.2 (*) 0.3 - 1.2 mg/dL   GFR calc non Af Amer 42 (*) >90 mL/min   GFR calc Af Amer 48 (*) >90 mL/min   Anion gap 11  5 - 15   EKG: nsr, no acute ischemic changes, normal intervals, normal axis, normal qrs complex   MDM   DDX: occult infection, ACS, anemia, electrolyte disturbance,. The patient is noted to have improved Hgb compared to one week ago. She has chronic stable renal failure and no significant electrolyte disturbance. We have ruled out UTI. Patient's results discussed with her and need for follow up with PCP, later in the day.     Elyn Peers, MD 07/06/13 [phone]

## 2013-07-05 NOTE — ED Notes (Signed)
Pt. reports generalized weakness /fatigue and nausea onset yesterday .

## 2013-07-06 LAB — I-STAT TROPONIN, ED: Troponin i, poc: 0 ng/mL (ref 0.00–0.08)

## 2013-07-06 NOTE — ED Notes (Signed)
Patient ambulated to and from restroom without difficulty.

## 2013-07-06 NOTE — ED Notes (Signed)
Patient left with all personal belongings.

## 2013-08-21 ENCOUNTER — Emergency Department (HOSPITAL_COMMUNITY)
Admission: EM | Admit: 2013-08-21 | Discharge: 2013-08-21 | Disposition: A | Discharge status: Home / Self Care | Payer: Medicaid Other | Attending: Emergency Medicine | Admitting: Emergency Medicine

## 2013-08-21 ENCOUNTER — Emergency Department (HOSPITAL_COMMUNITY): Payer: Medicaid Other

## 2013-08-21 ENCOUNTER — Encounter (HOSPITAL_COMMUNITY): Payer: Self-pay | Admitting: Emergency Medicine

## 2013-08-21 DIAGNOSIS — R42 Dizziness and giddiness: Secondary | ICD-10-CM | POA: Insufficient documentation

## 2013-08-21 DIAGNOSIS — R079 Chest pain, unspecified: Secondary | ICD-10-CM | POA: Insufficient documentation

## 2013-08-21 DIAGNOSIS — Z8742 Personal history of other diseases of the female genital tract: Secondary | ICD-10-CM | POA: Diagnosis not present

## 2013-08-21 DIAGNOSIS — Z90710 Acquired absence of both cervix and uterus: Secondary | ICD-10-CM | POA: Insufficient documentation

## 2013-08-21 DIAGNOSIS — K219 Gastro-esophageal reflux disease without esophagitis: Secondary | ICD-10-CM | POA: Diagnosis not present

## 2013-08-21 DIAGNOSIS — Z79899 Other long term (current) drug therapy: Secondary | ICD-10-CM | POA: Insufficient documentation

## 2013-08-21 DIAGNOSIS — I10 Essential (primary) hypertension: Secondary | ICD-10-CM | POA: Insufficient documentation

## 2013-08-21 DIAGNOSIS — E119 Type 2 diabetes mellitus without complications: Secondary | ICD-10-CM | POA: Diagnosis not present

## 2013-08-21 DIAGNOSIS — R109 Unspecified abdominal pain: Secondary | ICD-10-CM | POA: Diagnosis not present

## 2013-08-21 DIAGNOSIS — E7800 Pure hypercholesterolemia, unspecified: Secondary | ICD-10-CM | POA: Insufficient documentation

## 2013-08-21 LAB — URINE MICROSCOPIC-ADD ON

## 2013-08-21 LAB — COMPREHENSIVE METABOLIC PANEL
ALBUMIN: 1.3 g/dL — AB (ref 3.5–5.2)
ALT: 13 U/L (ref 0–35)
ANION GAP: 12 (ref 5–15)
AST: 20 U/L (ref 0–37)
Alkaline Phosphatase: 37 U/L — ABNORMAL LOW (ref 39–117)
BUN: 46 mg/dL — AB (ref 6–23)
CHLORIDE: 107 meq/L (ref 96–112)
CO2: 23 mEq/L (ref 19–32)
CREATININE: 1.8 mg/dL — AB (ref 0.50–1.10)
Calcium: 8 mg/dL — ABNORMAL LOW (ref 8.4–10.5)
GFR calc Af Amer: 40 mL/min — ABNORMAL LOW (ref >90)
GFR calc non Af Amer: 34 mL/min — ABNORMAL LOW (ref >90)
Glucose, Bld: 106 mg/dL — ABNORMAL HIGH (ref 70–99)
Potassium: 3.4 mEq/L — ABNORMAL LOW (ref 3.7–5.3)
Sodium: 142 mEq/L (ref 137–147)
TOTAL PROTEIN: 4.7 g/dL — AB (ref 6.0–8.3)
Total Bilirubin: <0.2 mg/dL — ABNORMAL LOW (ref 0.3–1.2)

## 2013-08-21 LAB — MAGNESIUM: MAGNESIUM: 1.3 mg/dL — AB (ref 1.5–2.5)

## 2013-08-21 LAB — TROPONIN I

## 2013-08-21 LAB — URINALYSIS, ROUTINE W REFLEX MICROSCOPIC
BILIRUBIN URINE: NEGATIVE
Glucose, UA: 250 mg/dL — AB
KETONES UR: NEGATIVE mg/dL
Leukocytes, UA: NEGATIVE
NITRITE: NEGATIVE
Specific Gravity, Urine: 1.019 (ref 1.005–1.030)
UROBILINOGEN UA: 0.2 mg/dL (ref 0.0–1.0)
pH: 6.5 (ref 5.0–8.0)

## 2013-08-21 LAB — CBC
HCT: 25.5 % — ABNORMAL LOW (ref 36.0–46.0)
Hemoglobin: 8.6 g/dL — ABNORMAL LOW (ref 12.0–15.0)
MCH: 26.1 pg (ref 26.0–34.0)
MCHC: 33.7 g/dL (ref 30.0–36.0)
MCV: 77.5 fL — AB (ref 78.0–100.0)
Platelets: 303 10*3/uL (ref 150–400)
RBC: 3.29 MIL/uL — ABNORMAL LOW (ref 3.87–5.11)
RDW: 14 % (ref 11.5–15.5)
WBC: 4.4 10*3/uL (ref 4.0–10.5)

## 2013-08-21 LAB — PROTIME-INR
INR: 0.99 (ref 0.00–1.49)
PROTHROMBIN TIME: 13.1 s (ref 11.6–15.2)

## 2013-08-21 LAB — PRO B NATRIURETIC PEPTIDE: PRO B NATRI PEPTIDE: 358.8 pg/mL — AB (ref 0–125)

## 2013-08-21 MED ORDER — HYDROCODONE-ACETAMINOPHEN 5-325 MG PO TABS: 1.0000 | ORAL_TABLET | Freq: Four times a day (QID) | ORAL | Status: DC | PRN

## 2013-08-21 MED ORDER — HYDROCODONE-ACETAMINOPHEN 5-325 MG PO TABS
1.0000 | ORAL_TABLET | Freq: Once | ORAL | Status: AC
  Administered 2013-08-21: 1 via ORAL
  Filled 2013-08-21: qty 1

## 2013-08-21 NOTE — ED Provider Notes (Signed)
CSN: [id number]     Arrival date & time 08/21/13  0718 History   First MD Initiated Contact with Patient 08/21/13 0725     Chief Complaint  Patient presents with  . Chest Pain  . Flank Pain     HPI  Patient presents with concern of ongoing chest pain.  Symptoms began 4 days ago, while the patient was bathing him. Since onset patient has had no persistent pain on the left upper chest, with radiation towards the left arm, left jaw. There is concurrent pain in the left flank. There is mild lightheadedness, no syncope, ongoing dyspnea, worse with exertion, that the patient states is unchanged from baseline. The patient notes that there are ,no clear alleviating or exacerbating factors, no pleuritic or exertional changes. Patient has a history of multiple prior episodes of chest pain, and has previously required transfusions due to anemia, as presumed etiology for her chest pain. Patient has minimal change disease, is currently taking oral chemotherapy, previously was on erythropoietin.   Past Medical History  Diagnosis Date  . Hypertension   . Diabetes mellitus   . Acid reflux   . High cholesterol   . Renal disorder     kidney issues, stage III CKD   Past Surgical History  Procedure Laterality Date  . Abdominal hysterectomy     Family History  Problem Relation Age of Onset  . Hypertension Mother   . Thyroid disease Mother   . Coronary artery disease Father   . Hypertension Father   . Diabetes Father    History  Substance Use Topics  . Smoking status: Never Smoker   . Smokeless tobacco: Not on file  . Alcohol Use: No   OB History   Grav Para Term Preterm Abortions TAB SAB Ect Mult Living                 Review of Systems  Constitutional:       Per HPI, otherwise negative  HENT:       Per HPI, otherwise negative  Respiratory:       Per HPI, otherwise negative  Cardiovascular:       Per HPI, otherwise negative  Gastrointestinal: Negative for vomiting.   Endocrine:       Negative aside from HPI  Genitourinary:       Neg aside from HPI   Musculoskeletal:       Per HPI, otherwise negative  Skin: Negative.   Neurological: Negative for syncope.      Allergies  Nsaids  Home Medications   Prior to Admission medications   Medication Sig Start Date End Date Taking? Authorizing Provider  diphenhydramine-acetaminophen (TYLENOL PM) 25-500 MG TABS Take 1 tablet by mouth at bedtime as needed (sleep).    Historical Provider, MD  LORazepam (ATIVAN) 0.5 MG tablet Take 0.5 mg by mouth every 6 (six) hours as needed for anxiety.    Historical Provider, MD  metolazone (ZAROXOLYN) 5 MG tablet Take 5 mg by mouth daily.    Historical Provider, MD  Multiple Vitamin (MULTIVITAMIN WITH MINERALS) TABS tablet Take 1 tablet by mouth daily.    Historical Provider, MD  omeprazole (PRILOSEC) 40 MG capsule Take 40 mg by mouth daily.      Historical Provider, MD  promethazine (PHENERGAN) 25 MG tablet Take 25 mg by mouth every 6 (six) hours as needed for nausea or vomiting.    Historical Provider, MD  simvastatin (ZOCOR) 10 MG tablet Take 10 mg by mouth at  bedtime.    Historical Provider, MD  torsemide (DEMADEX) 20 MG tablet Take 20 mg by mouth 2 (two) times daily.    Historical Provider, MD  vitamin C (ASCORBIC ACID) 500 MG tablet Take 500 mg by mouth daily.    Historical Provider, MD   BP 125/71  Pulse 69  Temp(Src) 98.8 F (37.1 C) (Oral)  Resp 18  SpO2 100% Physical Exam  Nursing note and vitals reviewed. Constitutional: She is oriented to person, place, and time. She appears well-developed and well-nourished. No distress.  HENT:  Head: Normocephalic and atraumatic.  Eyes: Conjunctivae and EOM are normal.  Cardiovascular: Normal rate and regular rhythm.   Pulmonary/Chest: Effort normal and breath sounds normal. No stridor. No respiratory distress.  Abdominal: She exhibits no distension.  Musculoskeletal: She exhibits edema.  Minimal non-pitting  edema, bilaterally  Neurological: She is alert and oriented to person, place, and time. No cranial nerve deficit.  Skin: Skin is warm and dry.  Psychiatric: She has a normal mood and affect.   Patient defers the offer of analgesia  Patient has history of prior echocardiogram, normal within the past year.  ED Course  Procedures   I reviewed the images and labs    EKG Interpretation   Date/Time:  Monday August 21 2013 07:21:35 EDT Ventricular Rate:  79 PR Interval:  140 QRS Duration: 86 QT Interval:  388 QTC Calculation: 444 R Axis:   77 Text Interpretation:  Normal sinus rhythm Normal ECG Sinus rhythm T wave  abnormality No significant change since last tracing Borderline ECG  Confirmed by Carmin Muskrat  MD ([id number]) on 08/21/2013 7:26:26 AM      11:43 AM On exam the patient is in no distress, using her his cellular telephone. We had a lengthy conversation about all labs, findings, the thoughts. Patient's followup at [facility], will call today to arrange followup care.  MDM   Patient presents with ongoing chest pain.  Patient has had chest pain multiple times in the past, and has a recent evaluation with reassuring results in terms of cardiac etiology. Notably, the patient was at a funeral for a relative who died of coronary disease the day prior to the onset of her chest pain. Patient's evaluation here is reassuring, with nonischemic findings. Patient's labs suggest ongoing chronic kidney disease, without acute changes. No evidence of distress, relatively reassuring findings, she was discharged to follow up with her primary care team   Carmin Muskrat, MD 08/21/13 1144

## 2013-08-21 NOTE — Discharge Instructions (Signed)
As discussed, it is important that you follow up as soon as possible with your physician for continued management of your condition.  If you develop any new, or concerning changes in your condition, please return to the emergency department immediately.    Chest Pain (Nonspecific) It is often hard to give a specific diagnosis for the cause of chest pain. There is always a chance that your pain could be related to something serious, such as a heart attack or a blood clot in the lungs. You need to follow up with your health care provider for further evaluation. CAUSES   Heartburn.  Pneumonia or bronchitis.  Anxiety or stress.  Inflammation around your heart (pericarditis) or lung (pleuritis or pleurisy).  A blood clot in the lung.  A collapsed lung (pneumothorax). It can develop suddenly on its own (spontaneous pneumothorax) or from trauma to the chest.  Shingles infection (herpes zoster virus). The chest wall is composed of bones, muscles, and cartilage. Any of these can be the source of the pain.  The bones can be bruised by injury.  The muscles or cartilage can be strained by coughing or overwork.  The cartilage can be affected by inflammation and become sore (costochondritis). DIAGNOSIS  Lab tests or other studies may be needed to find the cause of your pain. Your health care provider may have you take a test called an ambulatory electrocardiogram (ECG). An ECG records your heartbeat patterns over a 24-hour period. You may also have other tests, such as:  Transthoracic echocardiogram (TTE). During echocardiography, sound waves are used to evaluate how blood flows through your heart.  Transesophageal echocardiogram (TEE).  Cardiac monitoring. This allows your health care provider to monitor your heart rate and rhythm in real time.  Holter monitor. This is a portable device that records your heartbeat and can help diagnose heart arrhythmias. It allows your health care provider  to track your heart activity for several days, if needed.  Stress tests by exercise or by giving medicine that makes the heart beat faster. TREATMENT   Treatment depends on what may be causing your chest pain. Treatment may include:  Acid blockers for heartburn.  Anti-inflammatory medicine.  Pain medicine for inflammatory conditions.  Antibiotics if an infection is present.  You may be advised to change lifestyle habits. This includes stopping smoking and avoiding alcohol, caffeine, and chocolate.  You may be advised to keep your head raised (elevated) when sleeping. This reduces the chance of acid going backward from your stomach into your esophagus. Most of the time, nonspecific chest pain will improve within 2-3 days with rest and mild pain medicine.  HOME CARE INSTRUCTIONS   If antibiotics were prescribed, take them as directed. Finish them even if you start to feel better.  For the next few days, avoid physical activities that bring on chest pain. Continue physical activities as directed.  Do not use any tobacco products, including cigarettes, chewing tobacco, or electronic cigarettes.  Avoid drinking alcohol.  Only take medicine as directed by your health care provider.  Follow your health care provider's suggestions for further testing if your chest pain does not go away.  Keep any follow-up appointments you made. If you do not go to an appointment, you could develop lasting (chronic) problems with pain. If there is any problem keeping an appointment, call to reschedule. SEEK MEDICAL CARE IF:   Your chest pain does not go away, even after treatment.  You have a rash with blisters on your chest.  You have a fever. SEEK IMMEDIATE MEDICAL CARE IF:   You have increased chest pain or pain that spreads to your arm, neck, jaw, back, or abdomen.  You have shortness of breath.  You have an increasing cough, or you cough up blood.  You have severe back or abdominal  pain.  You feel nauseous or vomit.  You have severe weakness.  You faint.  You have chills. This is an emergency. Do not wait to see if the pain will go away. Get medical help at once. Call your local emergency services (911 in [place] Do not drive yourself to the hospital. MAKE SURE YOU:   Understand these instructions.  Will watch your condition.  Will get help right away if you are not doing well or get worse. Document Released: 10/01/2004 Document Revised: 12/27/2012 Document Reviewed: 07/28/2007 [facility] Patient Information 2015 [place], [place]. This information is not intended to replace advice given to you by your health care provider. Make sure you discuss any questions you have with your health care provider.

## 2013-08-21 NOTE — ED Notes (Signed)
Pt requesting pain medicine. MD made aware.

## 2013-08-21 NOTE — ED Notes (Addendum)
Pt c/o left upper chest pain that radiates to left arm and left jaw. Pt also c/o left flank pain. Reports everything started on Friday. Pt sts she has also felt nauseous, so she has been taking her anti-nausea medicine and ativan for anxiety sts it allowed her to rest but hasn't gotten rid of the pain. Pt reports she is currently taking PO chemo pills for her stage III chronic kidney disease. Pt c/o dysuria and decreased urine output, sts this is normal for her. Nad, skin warm and dry, resp e/u.

## 2013-08-21 NOTE — ED Notes (Signed)
Pt reports having left side chest pains for several days, pain into her left arm and jaw and stabbing pain to left flank. ekg done at triage, airway intact.

## 2013-09-12 ENCOUNTER — Inpatient Hospital Stay (HOSPITAL_COMMUNITY): Payer: Medicaid Other

## 2013-09-12 ENCOUNTER — Inpatient Hospital Stay (HOSPITAL_COMMUNITY)
Admission: EM | Admit: 2013-09-12 | Discharge: 2013-09-14 | DRG: 811 | Disposition: A | Discharge status: Home / Self Care | Payer: Medicaid Other | Attending: Internal Medicine | Admitting: Internal Medicine

## 2013-09-12 ENCOUNTER — Encounter (HOSPITAL_COMMUNITY): Payer: Self-pay | Admitting: Emergency Medicine

## 2013-09-12 DIAGNOSIS — Z79899 Other long term (current) drug therapy: Secondary | ICD-10-CM | POA: Diagnosis not present

## 2013-09-12 DIAGNOSIS — N059 Unspecified nephritic syndrome with unspecified morphologic changes: Secondary | ICD-10-CM

## 2013-09-12 DIAGNOSIS — I10 Essential (primary) hypertension: Secondary | ICD-10-CM | POA: Diagnosis present

## 2013-09-12 DIAGNOSIS — K219 Gastro-esophageal reflux disease without esophagitis: Secondary | ICD-10-CM | POA: Diagnosis present

## 2013-09-12 DIAGNOSIS — N183 Chronic kidney disease, stage 3 unspecified: Secondary | ICD-10-CM

## 2013-09-12 DIAGNOSIS — J9600 Acute respiratory failure, unspecified whether with hypoxia or hypercapnia: Secondary | ICD-10-CM | POA: Diagnosis present

## 2013-09-12 DIAGNOSIS — E876 Hypokalemia: Secondary | ICD-10-CM | POA: Diagnosis present

## 2013-09-12 DIAGNOSIS — M25469 Effusion, unspecified knee: Secondary | ICD-10-CM | POA: Diagnosis present

## 2013-09-12 DIAGNOSIS — E7800 Pure hypercholesterolemia, unspecified: Secondary | ICD-10-CM | POA: Diagnosis present

## 2013-09-12 DIAGNOSIS — N170 Acute kidney failure with tubular necrosis: Secondary | ICD-10-CM

## 2013-09-12 DIAGNOSIS — D649 Anemia, unspecified: Secondary | ICD-10-CM

## 2013-09-12 DIAGNOSIS — R609 Edema, unspecified: Secondary | ICD-10-CM

## 2013-09-12 DIAGNOSIS — D62 Acute posthemorrhagic anemia: Secondary | ICD-10-CM | POA: Diagnosis present

## 2013-09-12 DIAGNOSIS — N2889 Other specified disorders of kidney and ureter: Secondary | ICD-10-CM

## 2013-09-12 DIAGNOSIS — I129 Hypertensive chronic kidney disease with stage 1 through stage 4 chronic kidney disease, or unspecified chronic kidney disease: Secondary | ICD-10-CM | POA: Diagnosis present

## 2013-09-12 DIAGNOSIS — M25462 Effusion, left knee: Secondary | ICD-10-CM

## 2013-09-12 DIAGNOSIS — R110 Nausea: Secondary | ICD-10-CM

## 2013-09-12 DIAGNOSIS — I151 Hypertension secondary to other renal disorders: Secondary | ICD-10-CM

## 2013-09-12 DIAGNOSIS — R509 Fever, unspecified: Secondary | ICD-10-CM

## 2013-09-12 DIAGNOSIS — R079 Chest pain, unspecified: Secondary | ICD-10-CM

## 2013-09-12 DIAGNOSIS — R0602 Shortness of breath: Secondary | ICD-10-CM

## 2013-09-12 DIAGNOSIS — E119 Type 2 diabetes mellitus without complications: Secondary | ICD-10-CM | POA: Diagnosis present

## 2013-09-12 DIAGNOSIS — R0600 Dyspnea, unspecified: Secondary | ICD-10-CM | POA: Diagnosis present

## 2013-09-12 HISTORY — DX: Personal history of other medical treatment: Z92.89

## 2013-09-12 HISTORY — DX: Unspecified nephritic syndrome with minor glomerular abnormality: N05.0

## 2013-09-12 HISTORY — DX: Headache: R51

## 2013-09-12 LAB — CBC WITH DIFFERENTIAL/PLATELET
BASOS ABS: 0 10*3/uL (ref 0.0–0.1)
Basophils Relative: 1 % (ref 0–1)
EOS PCT: 3 % (ref 0–5)
Eosinophils Absolute: 0.1 10*3/uL (ref 0.0–0.7)
HEMATOCRIT: 22.7 % — AB (ref 36.0–46.0)
HEMOGLOBIN: 7.5 g/dL — AB (ref 12.0–15.0)
LYMPHS ABS: 0.8 10*3/uL (ref 0.7–4.0)
LYMPHS PCT: 20 % (ref 12–46)
MCH: 25.4 pg — ABNORMAL LOW (ref 26.0–34.0)
MCHC: 33 g/dL (ref 30.0–36.0)
MCV: 76.9 fL — AB (ref 78.0–100.0)
MONO ABS: 0.3 10*3/uL (ref 0.1–1.0)
Monocytes Relative: 8 % (ref 3–12)
Neutro Abs: 2.7 10*3/uL (ref 1.7–7.7)
Neutrophils Relative %: 68 % (ref 43–77)
Platelets: 268 10*3/uL (ref 150–400)
RBC: 2.95 MIL/uL — AB (ref 3.87–5.11)
RDW: 14.1 % (ref 11.5–15.5)
WBC: 3.9 10*3/uL — AB (ref 4.0–10.5)

## 2013-09-12 LAB — BASIC METABOLIC PANEL
ANION GAP: 10 (ref 5–15)
BUN: 37 mg/dL — ABNORMAL HIGH (ref 6–23)
CHLORIDE: 105 meq/L (ref 96–112)
CO2: 24 meq/L (ref 19–32)
CREATININE: 1.97 mg/dL — AB (ref 0.50–1.10)
Calcium: 8.2 mg/dL — ABNORMAL LOW (ref 8.4–10.5)
GFR calc Af Amer: 35 mL/min — ABNORMAL LOW (ref >90)
GFR calc non Af Amer: 31 mL/min — ABNORMAL LOW (ref >90)
Glucose, Bld: 104 mg/dL — ABNORMAL HIGH (ref 70–99)
Potassium: 3.1 mEq/L — ABNORMAL LOW (ref 3.7–5.3)
SODIUM: 139 meq/L (ref 137–147)

## 2013-09-12 LAB — PREPARE RBC (CROSSMATCH)

## 2013-09-12 LAB — D-DIMER, QUANTITATIVE (NOT AT ARMC): D DIMER QUANT: 0.9 ug{FEU}/mL — AB (ref 0.00–0.48)

## 2013-09-12 LAB — POC OCCULT BLOOD, ED: Fecal Occult Bld: NEGATIVE

## 2013-09-12 MED ORDER — TORSEMIDE 20 MG PO TABS
80.0000 mg | ORAL_TABLET | Freq: Every day | ORAL | Status: DC
  Administered 2013-09-12 – 2013-09-14 (×3): 80 mg via ORAL
  Filled 2013-09-12 (×3): qty 4

## 2013-09-12 MED ORDER — TECHNETIUM TO 99M ALBUMIN AGGREGATED
6.0000 | Freq: Once | INTRAVENOUS | Status: AC | PRN
  Administered 2013-09-12: 6 via INTRAVENOUS

## 2013-09-12 MED ORDER — PROMETHAZINE HCL 25 MG PO TABS: 25.0000 mg | ORAL_TABLET | Freq: Four times a day (QID) | ORAL | Status: DC | PRN

## 2013-09-12 MED ORDER — LORAZEPAM 1 MG PO TABS: 1.0000 mg | ORAL_TABLET | Freq: Once | ORAL | Status: DC

## 2013-09-12 MED ORDER — LORAZEPAM 0.5 MG PO TABS: 0.5000 mg | ORAL_TABLET | Freq: Four times a day (QID) | ORAL | Status: DC | PRN

## 2013-09-12 MED ORDER — ADULT MULTIVITAMIN W/MINERALS CH
1.0000 | ORAL_TABLET | Freq: Every day | ORAL | Status: DC
  Administered 2013-09-12 – 2013-09-14 (×3): 1 via ORAL
  Filled 2013-09-12 (×3): qty 1

## 2013-09-12 MED ORDER — FUROSEMIDE 10 MG/ML IJ SOLN
40.0000 mg | Freq: Once | INTRAMUSCULAR | Status: DC | PRN
Start: 2013-09-12 — End: 2013-09-12

## 2013-09-12 MED ORDER — ALBUTEROL SULFATE (2.5 MG/3ML) 0.083% IN NEBU: 2.5000 mg | INHALATION_SOLUTION | RESPIRATORY_TRACT | Status: DC | PRN

## 2013-09-12 MED ORDER — PANTOPRAZOLE SODIUM 40 MG PO TBEC
40.0000 mg | DELAYED_RELEASE_TABLET | Freq: Every day | ORAL | Status: DC
  Administered 2013-09-12 – 2013-09-14 (×3): 40 mg via ORAL
  Filled 2013-09-12 (×3): qty 1

## 2013-09-12 MED ORDER — HYDROCODONE-ACETAMINOPHEN 5-325 MG PO TABS
2.0000 | ORAL_TABLET | Freq: Once | ORAL | Status: AC
  Administered 2013-09-12: 2 via ORAL
  Filled 2013-09-12: qty 2

## 2013-09-12 MED ORDER — SORBITOL 70 % SOLN: 30.0000 mL | Freq: Every day | Status: DC | PRN

## 2013-09-12 MED ORDER — ATORVASTATIN CALCIUM 20 MG PO TABS
20.0000 mg | ORAL_TABLET | Freq: Every day | ORAL | Status: DC
  Administered 2013-09-12 – 2013-09-13 (×2): 20 mg via ORAL
  Filled 2013-09-12 (×4): qty 1

## 2013-09-12 MED ORDER — ACETAMINOPHEN 325 MG PO TABS
650.0000 mg | ORAL_TABLET | Freq: Four times a day (QID) | ORAL | Status: DC | PRN
  Administered 2013-09-14: 650 mg via ORAL
  Filled 2013-09-12: qty 2

## 2013-09-12 MED ORDER — ACETAMINOPHEN 650 MG RE SUPP: 650.0000 mg | Freq: Four times a day (QID) | RECTAL | Status: DC | PRN

## 2013-09-12 MED ORDER — SODIUM CHLORIDE 0.9 % IV SOLN
Freq: Once | INTRAVENOUS | Status: AC
  Administered 2013-09-12: 16:00:00 via INTRAVENOUS

## 2013-09-12 MED ORDER — METOLAZONE 5 MG PO TABS
5.0000 mg | ORAL_TABLET | Freq: Every day | ORAL | Status: DC
  Administered 2013-09-12 – 2013-09-14 (×3): 5 mg via ORAL
  Filled 2013-09-12 (×3): qty 1

## 2013-09-12 MED ORDER — VITAMIN C 500 MG PO TABS
500.0000 mg | ORAL_TABLET | Freq: Every day | ORAL | Status: DC
  Administered 2013-09-12 – 2013-09-14 (×3): 500 mg via ORAL
  Filled 2013-09-12 (×3): qty 1

## 2013-09-12 MED ORDER — LISINOPRIL 10 MG PO TABS
10.0000 mg | ORAL_TABLET | Freq: Every day | ORAL | Status: DC
  Administered 2013-09-12 – 2013-09-14 (×3): 10 mg via ORAL
  Filled 2013-09-12 (×3): qty 1

## 2013-09-12 MED ORDER — FUROSEMIDE 10 MG/ML IJ SOLN: 40.0000 mg | Freq: Once | INTRAMUSCULAR | Status: AC | PRN

## 2013-09-12 MED ORDER — HYDROCODONE-ACETAMINOPHEN 5-325 MG PO TABS
1.0000 | ORAL_TABLET | Freq: Four times a day (QID) | ORAL | Status: DC | PRN
  Administered 2013-09-12 – 2013-09-13 (×2): 1 via ORAL
  Filled 2013-09-12 (×2): qty 1

## 2013-09-12 MED ORDER — HEPARIN SODIUM (PORCINE) 5000 UNIT/ML IJ SOLN
5000.0000 [IU] | Freq: Three times a day (TID) | INTRAMUSCULAR | Status: DC
  Administered 2013-09-12 – 2013-09-14 (×5): 5000 [IU] via SUBCUTANEOUS
  Filled 2013-09-12 (×8): qty 1

## 2013-09-12 MED ORDER — CYCLOPHOSPHAMIDE 50 MG PO TABS
100.0000 mg | ORAL_TABLET | Freq: Every day | ORAL | Status: DC
  Administered 2013-09-12 – 2013-09-14 (×3): 100 mg via ORAL
  Filled 2013-09-12 (×3): qty 2

## 2013-09-12 NOTE — ED Notes (Signed)
The patient said she has been having left knee pain since saturday.  This morning she said she woke up having SOB.  She says it gets worse when she walks and when she is doing something.  The patient said her MD advised her she was susceptible to blood clots so she is here to be evaluated.  She rates her pain 6/10.

## 2013-09-12 NOTE — Consult Note (Signed)
ORTHOPAEDIC CONSULTATION  REQUESTING PHYSICIAN: [name], MD  Chief Complaint: L knee pain  HPI: Patricia [name] is a 38 y.o. female who complains of  H/o atraumatic effusions 11yrago that improved with injection of steroids. She developed L knee pain and swelling with no injury over the last 7days. No fevers, feels well. No wounds about knee  Past Medical History  Diagnosis Date  . Hypertension   . Diabetes mellitus   . Acid reflux   . High cholesterol   . Renal disorder     kidney issues, stage III CKD   Past Surgical History  Procedure Laterality Date  . Abdominal hysterectomy     History   Social History  . Marital Status: Single    Spouse Name: N/A    Number of Children: N/A  . Years of Education: N/A   Social History Main Topics  . Smoking status: Never Smoker   . Smokeless tobacco: None  . Alcohol Use: No  . Drug Use: No  . Sexual Activity: None   Other Topics Concern  . None   Social History Narrative  . None   Family History  Problem Relation Age of Onset  . Hypertension Mother   . Thyroid disease Mother   . Coronary artery disease Father   . Hypertension Father   . Diabetes Father    Allergies  Allergen Reactions  . Nsaids     Kidney disease   Prior to Admission medications   Medication Sig Start Date End Date Taking? Authorizing Provider  atorvastatin (LIPITOR) 20 MG tablet Take 20 mg by mouth at bedtime.   Yes Historical Provider, MD  cyclophosphamide (CYTOXAN) 25 MG tablet Take 100 mg by mouth daily. Give on an empty stomach 1 hour before or 2 hours after meals.   Yes Historical Provider, MD  HYDROcodone-acetaminophen (NORCO/VICODIN) 5-325 MG per tablet Take 1 tablet by mouth every 6 (six) hours as needed for moderate pain. 08/21/13  Yes RCarmin Muskrat MD  lisinopril (PRINIVIL,ZESTRIL) 10 MG tablet Take 10 mg by mouth daily.   Yes Historical Provider, MD  LORazepam (ATIVAN) 0.5 MG tablet Take 0.5 mg by mouth every 6 (six)  hours as needed for anxiety.   Yes Historical Provider, MD  metolazone (ZAROXOLYN) 5 MG tablet Take 5 mg by mouth daily.   Yes Historical Provider, MD  Multiple Vitamin (MULTIVITAMIN WITH MINERALS) TABS tablet Take 1 tablet by mouth daily.   Yes Historical Provider, MD  omeprazole (PRILOSEC) 40 MG capsule Take 40 mg by mouth daily.     Yes Historical Provider, MD  promethazine (PHENERGAN) 25 MG tablet Take 25 mg by mouth every 6 (six) hours as needed for nausea or vomiting.   Yes Historical Provider, MD  torsemide (DEMADEX) 20 MG tablet Take 80 mg by mouth daily.    Yes Historical Provider, MD  vitamin C (ASCORBIC ACID) 500 MG tablet Take 500 mg by mouth daily.   Yes Historical Provider, MD   No results found.  Positive ROS: All other systems have been reviewed and were otherwise negative with the exception of those mentioned in the HPI and as above.  Labs cbc  Recent Labs  09/12/13 1215  WBC 3.9*  HGB 7.5*  HCT 22.7*  PLT 268    Labs inflam No results found for this basename: ESR, CRP,  in the last 72 hours  Labs coag No results found for this basename: INR, PT, PTT,  in the last 72  hours   Recent Labs  09/12/13 1215  NA 139  K 3.1*  CL 105  CO2 24  GLUCOSE 104*  BUN 37*  CREATININE 1.97*  CALCIUM 8.2*    Physical Exam: Filed Vitals:   09/12/13 1610  BP: 147/80  Pulse: 74  Temp:   Resp: 16   General: Alert, no acute distress Cardiovascular: No pedal edema Respiratory: No cyanosis, no use of accessory musculature GI: No organomegaly, abdomen is soft and non-tender Skin: No lesions in the area of chief complaint other than those listed below in MSK exam.  Neurologic: Sensation intact distally Psychiatric: Patient is competent for consent with normal mood and affect Lymphatic: No axillary or cervical lymphadenopathy  MUSCULOSKELETAL:  LLE: Large effusion at knee, painless small arc motion SILT DP/SP/S/S/T nerve, 2+ DP, +TA/GS/EHL Compartments soft No  Crepitous  Other extremities are atraumatic with painless ROM and NVI.  Assessment: L knee painful effusion.   Plan: Will perform aspiration and injection of steroid based on fluid appearance.  Weight Bearing Status: WBAT Ace wrap    [name], D, MD Cell [phone]   09/12/2013 4:38 PM

## 2013-09-12 NOTE — ED Provider Notes (Signed)
Medical screening examination/treatment/procedure(s) were performed by non-physician practitioner and as supervising physician I was immediately available for consultation/collaboration.   EKG Interpretation None       [name], MD 09/12/13 [phone]

## 2013-09-12 NOTE — H&P (Signed)
Patient Demographics  Patricia [name], is a 38 y.o. female  MRN: [id number]   DOB - March 28, 1975  Admit Date - 09/12/2013  Outpatient Primary MD for the patient is LONG,ASHLEY B, PA-C   With History of -  Past Medical History  Diagnosis Date  . Hypertension   . Diabetes mellitus   . Acid reflux   . High cholesterol   . Renal disorder     kidney issues, stage III CKD      Past Surgical History  Procedure Laterality Date  . Abdominal hysterectomy      in for   Chief Complaint  Patient presents with  . Shortness of Breath    The patient said she has been having left knee pain since saturday.  This morning she said she woke up having SOB.  She says it gets worse when she walks and when she is doing something.  . Joint Swelling     HPI  Patricia [name]  is a 38 y.o. female, with known history of chronic kidney disease baseline creatinine of 2, secondary to nephrotic syndrome and minimal changes glomerulonephritis, followed by [facility] nephrology, presents today with shortness of breath, found to have hemoglobin of 7.5, which is a drop from her baseline, she has been gradually trending down from 11 ,few month ago to 7.5 today (but most recent was 8.6 at [facility] office 5 days ago), she was Hemoccult negative in ED, as well was found to have chronic kidney disease with creatinine of 2, which is around her baseline, this was discussed with patient's primary nephrologist at [facility], she is on Arnesp  for her anemia, patient d-dimer was mildly elevated, VQ scan still pending, she denies chest pain, palpitation, no tachycardia or hypoxia. She has complaints of left knee pain, then a Doppler was negative for DVT, noticed to have effusion on physical exam, orthopedic was consulted.      Review of Systems    In addition to the HPI above,  No Fever-chills, No Headache, No changes with Vision or hearing, No problems swallowing food or Liquids, No Chest pain, Cough complaints of exertional  Shortness of Breath, No Abdominal pain, No Nausea or Vommitting, Bowel movements are regular, No Blood in stool or Urine, No dysuria, No new skin rashes or bruises, No new joints pains-aches,  No new weakness, tingling, numbness in any extremity, complains of generalized weakness No recent weight gain or loss, No polyuria, polydypsia or polyphagia, No significant Mental Stressors.  A full 10 point Review of Systems was done, except as stated above, all other Review of Systems were negative.   Social History History  Substance Use Topics  . Smoking status: Never Smoker   . Smokeless tobacco: Not on file  . Alcohol Use: No     Family History Family History  Problem Relation Age of Onset  . Hypertension Mother   . Thyroid disease Mother   . Coronary artery disease Father   . Hypertension Father   . Diabetes Father      Prior to Admission medications   Medication Sig Start Date End Date Taking? Authorizing Provider  atorvastatin (LIPITOR) 20 MG tablet Take 20 mg by mouth at bedtime.   Yes Historical Provider, MD  cyclophosphamide (CYTOXAN) 25 MG tablet Take 100 mg by mouth daily. Give on an empty stomach 1 hour before or 2 hours after meals.   Yes Historical Provider, MD  HYDROcodone-acetaminophen (NORCO/VICODIN) 5-325 MG per tablet Take 1 tablet by mouth  every 6 (six) hours as needed for moderate pain. 08/21/13  Yes Carmin Muskrat, MD  lisinopril (PRINIVIL,ZESTRIL) 10 MG tablet Take 10 mg by mouth daily.   Yes Historical Provider, MD  LORazepam (ATIVAN) 0.5 MG tablet Take 0.5 mg by mouth every 6 (six) hours as needed for anxiety.   Yes Historical Provider, MD  metolazone (ZAROXOLYN) 5 MG tablet Take 5 mg by mouth daily.   Yes Historical Provider, MD  Multiple Vitamin (MULTIVITAMIN WITH MINERALS) TABS tablet Take 1 tablet by mouth daily.   Yes Historical Provider, MD  omeprazole (PRILOSEC) 40 MG capsule Take 40 mg by mouth daily.     Yes Historical Provider, MD  promethazine  (PHENERGAN) 25 MG tablet Take 25 mg by mouth every 6 (six) hours as needed for nausea or vomiting.   Yes Historical Provider, MD  torsemide (DEMADEX) 20 MG tablet Take 80 mg by mouth daily.    Yes Historical Provider, MD  vitamin C (ASCORBIC ACID) 500 MG tablet Take 500 mg by mouth daily.   Yes Historical Provider, MD    Allergies  Allergen Reactions  . Nsaids     Kidney disease    Physical Exam  Vitals  Blood pressure 147/80, pulse 74, temperature 98 F (36.7 C), temperature source Oral, resp. rate 16, SpO2 100.00%.   1. General obese female lying in bed in NAD,    2. Normal affect and insight, Not Suicidal or Homicidal, Awake Alert, Oriented X 3.  3. No F.N deficits, ALL C.Nerves Intact, Strength 5/5 all 4 extremities, Sensation intact all 4 extremities, Plantars down going.  4. Ears and Eyes appear Normal, Conjunctivae clear, PERRLA. Moist Oral Mucosa.  5. Supple Neck, No JVD, No cervical lymphadenopathy appriciated, No Carotid Bruits.  6. Symmetrical Chest wall movement, Good air movement bilaterally, CTAB.  7. RRR, No Gallops, Rubs or Murmurs, No Parasternal Heave.  8. Positive Bowel Sounds, Abdomen Soft, No tenderness, No organomegaly appriciated,No rebound -guarding or rigidity.  9.  No Cyanosis, Normal Skin Turgor, No Skin Rash or Bruise.  10. Good muscle tone,  joints appear normal , had left knee effusion, Normal ROM. Has +1-2 lower extremity edema.  11. No Palpable Lymph Nodes in Neck or Axillae    Data Review  CBC  Recent Labs Lab 09/12/13 1215  WBC 3.9*  HGB 7.5*  HCT 22.7*  PLT 268  MCV 76.9*  MCH 25.4*  MCHC 33.0  RDW 14.1  LYMPHSABS 0.8  MONOABS 0.3  EOSABS 0.1  BASOSABS 0.0   ------------------------------------------------------------------------------------------------------------------  Chemistries   Recent Labs Lab 09/12/13 1215  NA 139  K 3.1*  CL 105  CO2 24  GLUCOSE 104*  BUN 37*  CREATININE 1.97*  CALCIUM 8.2*    ------------------------------------------------------------------------------------------------------------------ CrCl is unknown because both a height and weight (above a minimum accepted value) are required for this calculation. ------------------------------------------------------------------------------------------------------------------ No results found for this basename: TSH, T4TOTAL, FREET3, T3FREE, THYROIDAB,  in the last 72 hours   Coagulation profile No results found for this basename: INR, PROTIME,  in the last 168 hours -------------------------------------------------------------------------------------------------------------------  Recent Labs  09/12/13 1215  DDIMER 0.90*   -------------------------------------------------------------------------------------------------------------------  Cardiac Enzymes No results found for this basename: CK, CKMB, TROPONINI, MYOGLOBIN,  in the last 168 hours ------------------------------------------------------------------------------------------------------------------ No components found with this basename: POCBNP,    ---------------------------------------------------------------------------------------------------------------  Urinalysis    Component Value Date/Time   COLORURINE YELLOW 08/21/2013 [place] 08/21/2013 0846   LABSPEC 1.019 08/21/2013 0846   PHURINE 6.5  08/21/2013 0846   GLUCOSEU 250* 08/21/2013 0846   HGBUR SMALL* 08/21/2013 0846   BILIRUBINUR NEGATIVE 08/21/2013 0846   KETONESUR NEGATIVE 08/21/2013 0846   PROTEINUR >300* 08/21/2013 0846   UROBILINOGEN 0.2 08/21/2013 0846   NITRITE NEGATIVE 08/21/2013 0846   LEUKOCYTESUR NEGATIVE 08/21/2013 0846    ----------------------------------------------------------------------------------------------------------------  Imaging results:       Assessment & Plan  Active Problems:   Shortness of breath   HTN (hypertension)   DM (diabetes  mellitus)   Glomerulonephritis   Symptomatic anemia   CKD (chronic kidney disease)   Effusion of left knee    1. shortness of breath: This is most likely related to her symptomatic anemia, but given her elevated D. Dimers, check VQ scan to rule out PE, unlikely given she is not hypoxic or tachycardic, and does not complain of any chest pain.  2. symptomatic anemia:  This is secondary to her chronic kidney disease, patient is on Aranesp as an outpatient, will require one unit back to blood transfusion as discussed with her primary nephrologist.  She is Hemoccult negative in ED.  3. Left knee effusion:  Will obtain x-ray, consulted Dr. Percell Miller from orthopedic service to evaluate the need for a arthrocentesis.  4. chronic kidney disease:   Appears to be stable at baseline, this is secondary to her nephrotic syndrome and minimal change glomerulonephritis, plan was discussed with [facility] nephrology, will continue her lisinopril, torsemide, metolazone, and cyclophosphamide  home dose given her kidney function is stable, given her one dose of IV Lasix after transfusion.  5. Hypertension:  Pressure is acceptable, continue with home medication.  6. Diabetes mellitus:  Monitor CBGs while she is in hospital, and if needed will start insulin sliding scale.   DVT Prophylaxis Heparin -SCDs  AM Labs Ordered, also please review Full Orders  Family Communication: Admission, patients condition and plan of care including tests being ordered have been discussed with the patient  who indicate understanding and agree with the plan and Code Status.  Code Status Full  Likely DC to  Home in 24 hours  Condition GUARDED   Time spent in minutes : 65 minutes.    [facility], DAWOOD M.D on 09/12/2013 at 4:40 PM  Between 7am to 7pm - Pager - [phone]  After 7pm go to [email or web address] - password [email or web address]  And look for the night coverage person covering me after hours  [facility] Office   [phone]   **Disclaimer: This note may have been dictated with voice recognition software. Similar sounding words can inadvertently be transcribed and this note may contain transcription errors which may not have been corrected upon publication of note.**

## 2013-09-12 NOTE — Progress Notes (Signed)
Patient states that her pain medication is not working and would like something else for pain- NP has been notified.

## 2013-09-12 NOTE — ED Notes (Signed)
Patient transported to Ultrasound 

## 2013-09-12 NOTE — ED Provider Notes (Signed)
CSN: [id number]     Arrival date & time 09/12/13  1118 History   First MD Initiated Contact with Patient 09/12/13 1152     Chief Complaint  Patient presents with  . Shortness of Breath    The patient said she has been having left knee pain since saturday.  This morning she said she woke up having SOB.  She says it gets worse when she walks and when she is doing something.  . Joint Swelling     (Consider location/radiation/quality/duration/timing/severity/associated sxs/prior Treatment) HPI Comments: Patient with CKD, presents to the emergency department with chief complaint of shortness of breath. She states that when she woke this morning, she was having shortness of breath. It worsens with exertion. She denies any associated chest pain or diaphoresis. Additionally, she states that she has had unilateral left leg swelling, which is new for her. She was referred to the ED by her PCP, who told her to be evaluated for blood clot. She denies history of PE or DVT. Denies any recent surgeries, recent travel.  She states that the pain in her leg is mild.  She denies associated fevers, chills, nausea, vomiting, or diarrhea.  The history is provided by the patient. No language interpreter was used.    Past Medical History  Diagnosis Date  . Hypertension   . Diabetes mellitus   . Acid reflux   . High cholesterol   . Renal disorder     kidney issues, stage III CKD   Past Surgical History  Procedure Laterality Date  . Abdominal hysterectomy     Family History  Problem Relation Age of Onset  . Hypertension Mother   . Thyroid disease Mother   . Coronary artery disease Father   . Hypertension Father   . Diabetes Father    History  Substance Use Topics  . Smoking status: Never Smoker   . Smokeless tobacco: Not on file  . Alcohol Use: No   OB History   Grav Para Term Preterm Abortions TAB SAB Ect Mult Living                 Review of Systems  All other systems reviewed and are  negative.     Allergies  Nsaids  Home Medications   Prior to Admission medications   Medication Sig Start Date End Date Taking? Authorizing Provider  atorvastatin (LIPITOR) 20 MG tablet Take 20 mg by mouth at bedtime.   Yes Historical Provider, MD  cyclophosphamide (CYTOXAN) 25 MG tablet Take 100 mg by mouth daily. Give on an empty stomach 1 hour before or 2 hours after meals.   Yes Historical Provider, MD  HYDROcodone-acetaminophen (NORCO/VICODIN) 5-325 MG per tablet Take 1 tablet by mouth every 6 (six) hours as needed for moderate pain. 08/21/13  Yes Carmin Muskrat, MD  lisinopril (PRINIVIL,ZESTRIL) 10 MG tablet Take 10 mg by mouth daily.   Yes Historical Provider, MD  LORazepam (ATIVAN) 0.5 MG tablet Take 0.5 mg by mouth every 6 (six) hours as needed for anxiety.   Yes Historical Provider, MD  metolazone (ZAROXOLYN) 5 MG tablet Take 5 mg by mouth daily.   Yes Historical Provider, MD  Multiple Vitamin (MULTIVITAMIN WITH MINERALS) TABS tablet Take 1 tablet by mouth daily.   Yes Historical Provider, MD  omeprazole (PRILOSEC) 40 MG capsule Take 40 mg by mouth daily.     Yes Historical Provider, MD  promethazine (PHENERGAN) 25 MG tablet Take 25 mg by mouth every 6 (six) hours as  needed for nausea or vomiting.   Yes Historical Provider, MD  torsemide (DEMADEX) 20 MG tablet Take 80 mg by mouth daily.    Yes Historical Provider, MD  vitamin C (ASCORBIC ACID) 500 MG tablet Take 500 mg by mouth daily.   Yes Historical Provider, MD   BP 123/84  Pulse 80  Temp(Src) 98 F (36.7 C) (Oral)  Resp 16  SpO2 100% Physical Exam  Nursing note and vitals reviewed. Constitutional: She is oriented to person, place, and time. She appears well-developed and well-nourished.  HENT:  Head: Normocephalic and atraumatic.  Eyes: Conjunctivae and EOM are normal. Pupils are equal, round, and reactive to light.  Neck: Normal range of motion. Neck supple.  Cardiovascular: Normal rate and regular rhythm.  Exam  reveals no gallop and no friction rub.   No murmur heard. Pulmonary/Chest: Effort normal and breath sounds normal. No respiratory distress. She has no wheezes. She has no rales. She exhibits no tenderness.  Abdominal: Soft. Bowel sounds are normal. She exhibits no distension and no mass. There is no tenderness. There is no rebound and no guarding.  Musculoskeletal: Normal range of motion. She exhibits no edema and no tenderness.  Left leg is moderately swollen with some calf and popliteal tenderness  Neurological: She is alert and oriented to person, place, and time.  Skin: Skin is warm and dry.  No erythema or signs of cellulitis.  Psychiatric: She has a normal mood and affect. Her behavior is normal. Judgment and thought content normal.    ED Course  Procedures (including critical care time) Results for orders placed during the hospital encounter of 09/12/13  CBC WITH DIFFERENTIAL      Result Value Ref Range   WBC 3.9 (*) 4.0 - 10.5 K/uL   RBC 2.95 (*) 3.87 - 5.11 MIL/uL   Hemoglobin 7.5 (*) 12.0 - 15.0 g/dL   HCT 22.7 (*) 36.0 - 46.0 %   MCV 76.9 (*) 78.0 - 100.0 fL   MCH 25.4 (*) 26.0 - 34.0 pg   MCHC 33.0  30.0 - 36.0 g/dL   RDW 14.1  11.5 - 15.5 %   Platelets 268  150 - 400 K/uL   Neutrophils Relative % 68  43 - 77 %   Neutro Abs 2.7  1.7 - 7.7 K/uL   Lymphocytes Relative 20  12 - 46 %   Lymphs Abs 0.8  0.7 - 4.0 K/uL   Monocytes Relative 8  3 - 12 %   Monocytes Absolute 0.3  0.1 - 1.0 K/uL   Eosinophils Relative 3  0 - 5 %   Eosinophils Absolute 0.1  0.0 - 0.7 K/uL   Basophils Relative 1  0 - 1 %   Basophils Absolute 0.0  0.0 - 0.1 K/uL  BASIC METABOLIC PANEL      Result Value Ref Range   Sodium 139  137 - 147 mEq/L   Potassium 3.1 (*) 3.7 - 5.3 mEq/L   Chloride 105  96 - 112 mEq/L   CO2 24  19 - 32 mEq/L   Glucose, Bld 104 (*) 70 - 99 mg/dL   BUN 37 (*) 6 - 23 mg/dL   Creatinine, Ser 1.97 (*) 0.50 - 1.10 mg/dL   Calcium 8.2 (*) 8.4 - 10.5 mg/dL   GFR calc non Af  Amer 31 (*) >90 mL/min   GFR calc Af Amer 35 (*) >90 mL/min   Anion gap 10  5 - 15  D-DIMER, QUANTITATIVE  Result Value Ref Range   D-Dimer, Quant 0.90 (*) 0.00 - 0.48 ug/mL-FEU   Dg Chest 2 View  08/21/2013   CLINICAL DATA:  Chest pain.  EXAM: CHEST  2 VIEW  COMPARISON:  06/29/2013.  FINDINGS: Mediastinum and hilar structures are normal. The lungs are clear. Heart size normal. No pleural effusion or pneumothorax. Degenerative changes thoracic spine.  IMPRESSION: No active cardiopulmonary disease.   Electronically Signed   By: Marcello Moores  Register   On: 08/21/2013 08:18      EKG Interpretation None      MDM   Final diagnoses:  Symptomatic anemia   Patient with SOB. Will check labs.  Concern for DVT.  Dimer is elevated.  VQ machine is broken.  Cant CT based on creatinine.  Will admit to medicine.  Will transfuse in the mean time.  Patient with symptomatic anemia. Denies bleeding from rectum or vagina. Hemoccult negative. I discussed patient with Dr. Karle Starch from [facility] nephrology, who recommends transfusing.    CRITICAL CARE Performed by: [name]   Total critical care time: 35  Critical care time was exclusive of separately billable procedures and treating other patients.  Critical care was necessary to treat or prevent imminent or life-threatening deterioration.  Critical care was time spent personally by me on the following activities: development of treatment plan with patient and/or surrogate as well as nursing, discussions with consultants, evaluation of patient's response to treatment, examination of patient, obtaining history from patient or surrogate, ordering and performing treatments and interventions, ordering and review of laboratory studies, ordering and review of radiographic studies, pulse oximetry and re-evaluation of patient's condition.     [name], PA-C 09/12/13 1610

## 2013-09-12 NOTE — Progress Notes (Signed)
Pt admitted to the unit at 1700. Pt mental status is A&OX4. Pt oriented to room, staff, and call bell. Skin is intact. Full assessment charted in [facility]. Call bell within reach. Visitor guidelines reviewed w/ pt and/or family.

## 2013-09-12 NOTE — Progress Notes (Signed)
UR complete. Sandria Mcenroe RN CCM Case Mgmt phone [phone] 

## 2013-09-12 NOTE — Progress Notes (Signed)
Left lower extremity venous duplex completed.  Left:  No evidence of DVT, superficial thrombosis, or Baker's cyst.  Right:  Negative for DVT in the common femoral vein.  

## 2013-09-12 NOTE — Progress Notes (Signed)
Called for report in ED at 1633. Awaiting patient arrival to floor.

## 2013-09-13 DIAGNOSIS — R609 Edema, unspecified: Secondary | ICD-10-CM

## 2013-09-13 LAB — SYNOVIAL CELL COUNT + DIFF, W/ CRYSTALS
Crystals, Fluid: POSITIVE
EOSINOPHILS-SYNOVIAL: 0 % (ref 0–1)
Lymphocytes-Synovial Fld: 48 % — ABNORMAL HIGH (ref 0–20)
Monocyte-Macrophage-Synovial Fluid: 49 % — ABNORMAL LOW (ref 50–90)
Neutrophil, Synovial: 3 % (ref 0–25)
WBC, Synovial: 47 /mm3 (ref 0–200)

## 2013-09-13 LAB — BASIC METABOLIC PANEL
ANION GAP: 10 (ref 5–15)
BUN: 36 mg/dL — AB (ref 6–23)
CO2: 25 mEq/L (ref 19–32)
CREATININE: 1.84 mg/dL — AB (ref 0.50–1.10)
Calcium: 7.9 mg/dL — ABNORMAL LOW (ref 8.4–10.5)
Chloride: 105 mEq/L (ref 96–112)
GFR calc Af Amer: 39 mL/min — ABNORMAL LOW (ref >90)
GFR, EST NON AFRICAN AMERICAN: 33 mL/min — AB (ref >90)
Glucose, Bld: 118 mg/dL — ABNORMAL HIGH (ref 70–99)
Potassium: 3 mEq/L — ABNORMAL LOW (ref 3.7–5.3)
Sodium: 140 mEq/L (ref 137–147)

## 2013-09-13 LAB — CBC
HEMATOCRIT: 28.9 % — AB (ref 36.0–46.0)
Hemoglobin: 9.8 g/dL — ABNORMAL LOW (ref 12.0–15.0)
MCH: 27.3 pg (ref 26.0–34.0)
MCHC: 33.9 g/dL (ref 30.0–36.0)
MCV: 80.5 fL (ref 78.0–100.0)
Platelets: 221 10*3/uL (ref 150–400)
RBC: 3.59 MIL/uL — ABNORMAL LOW (ref 3.87–5.11)
RDW: 14 % (ref 11.5–15.5)
WBC: 3.9 10*3/uL — AB (ref 4.0–10.5)

## 2013-09-13 LAB — GLUCOSE, CAPILLARY: Glucose-Capillary: 89 mg/dL (ref 70–99)

## 2013-09-13 MED ORDER — POTASSIUM CHLORIDE 10 MEQ/100ML IV SOLN
10.0000 meq | INTRAVENOUS | Status: AC
Start: 2013-09-13 — End: 2013-09-13
  Administered 2013-09-13 (×4): 10 meq via INTRAVENOUS
  Filled 2013-09-13 (×4): qty 100

## 2013-09-13 MED ORDER — HYDROMORPHONE HCL PF 1 MG/ML IJ SOLN: 1.0000 mg | INTRAMUSCULAR | Status: DC | PRN

## 2013-09-13 MED ORDER — OXYCODONE-ACETAMINOPHEN 5-325 MG PO TABS
1.0000 | ORAL_TABLET | ORAL | Status: DC | PRN
  Administered 2013-09-13: 2 via ORAL
  Filled 2013-09-13: qty 2

## 2013-09-13 MED ORDER — PROMETHAZINE HCL 25 MG/ML IJ SOLN
12.5000 mg | Freq: Once | INTRAMUSCULAR | Status: AC
  Administered 2013-09-13: 12.5 mg via INTRAVENOUS
  Filled 2013-09-13: qty 1

## 2013-09-13 NOTE — Progress Notes (Signed)
Patient ID: Patricia [name], female   DOB: 11-10-1975, 38 y.o.   MRN: [id number] [facility] PROGRESS NOTE  Helayna Dun JTT:[id number] DOB: May 25, 1975 DOA: 09/12/2013 PCP: Hillis Range  Brief narrative: 38 y.o. female, with known history of chronic kidney disease baseline creatinine of 2, secondary to nephrotic syndrome and minimal changes glomerulonephritis, followed by [facility] nephrology, presented with shortness of breath, found to have hemoglobin of 7.5, which is a drop from her baseline, she has been gradually trending down from 11 few month ago.  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  Suprapatellar effusion.   Nm Pulmonary Perf And Vent  09/12/2013  Low probability pulmonary embolus.  Medical Consultants:   Ortho  Other Consultants:   None     Anti-Infectives:   None  Faye Ramsay, MD  [facility] Pager [phone]  If 7PM-7AM, please contact night-coverage [email or web address] Password [facility] 09/13/2013, 4:35 PM   LOS: 1 day   HPI/Subjective: No events overnight.   Objective: Filed Vitals:   09/13/13 0762 09/13/13 0714 09/13/13 0815 09/13/13 1408  BP: 114/55 145/81 144/89 125/66  Pulse: 70 64 62 74  Temp: 98.5 F (36.9 C) 98.5 F (36.9 C) 98.6 F (37 C) 98.7 F (37.1 C)  TempSrc: Oral Oral Oral Oral  Resp: $Remo'18 18 18 16  'QyslH$ Height:      Weight:      SpO2: 100%  100% 98%    Intake/Output Summary (Last 24 hours) at 09/13/13 1635 Last data filed at 09/13/13 1409  Gross per 24 hour  Intake   1315 ml  Output      0 ml  Net   1315 ml    Exam:   General:  Pt is alert, follows commands appropriately, not in acute distress  Cardiovascular: Regular rate and rhythm, S1/S2, no murmurs, no rubs, no gallops  Respiratory: Clear to auscultation bilaterally, no wheezing, no crackles, no rhonchi  Abdomen: Soft, non tender, non distended, bowel sounds present, no guarding  Extremities: pulses DP and PT palpable bilaterally  Neuro: Grossly nonfocal  Data Reviewed: Basic Metabolic Panel:  Recent Labs Lab 09/12/13 1215 09/13/13 0904  NA 139 140  K 3.1* 3.0*  CL 105 105  CO2 24 25  GLUCOSE 104*  118*  BUN 37* 36*  CREATININE 1.97* 1.84*  CALCIUM 8.2* 7.9*   CBC:  Recent Labs Lab 09/12/13 1215 09/13/13 0904  WBC 3.9* 3.9*  NEUTROABS 2.7  --   HGB 7.5* 9.8*  HCT 22.7* 28.9*  MCV 76.9* 80.5  PLT 268 221   CBG:  Recent Labs Lab 09/13/13 0755  GLUCAP 89    Recent Results (from the past 240 hour(s))  BODY FLUID CULTURE     Status: None   Collection Time    09/13/13  8:54 AM      Result Value Ref Range Status   Specimen Description SYNOVIAL FLUID KNEE LEFT   Final   Special Requests 50ML FLUID   Final   Gram Stain     Final   Value: FEW WBC PRESENT,BOTH PMN AND MONONUCLEAR     NO ORGANISMS SEEN      Performed at [facility]   Culture PENDING   Incomplete   Report Status PENDING   Incomplete     Scheduled Meds: . atorvastatin  20 mg Oral QHS  . cyclophosphamide  100 mg Oral Daily  . heparin  5,000 Units Subcutaneous 3 times per day  . lisinopril  10 mg Oral Daily  . LORazepam  1 mg Oral Once  . metolazone  5 mg Oral Daily  . multivitamin with minerals  1 tablet Oral Daily  . pantoprazole  40 mg Oral Daily  . torsemide  80 mg Oral Daily  . vitamin C  500 mg Oral Daily   Continuous Infusions:

## 2013-09-13 NOTE — Progress Notes (Signed)
2 units completed. Denies SOB, fever, HA, n/v. CBC ordered

## 2013-09-13 NOTE — Progress Notes (Signed)
Subjective:  Patient reports pain as mod  Objective:   VITALS:   Filed Vitals:   09/13/13 0532 09/13/13 0569 09/13/13 0714 09/13/13 0815  BP: 114/63 114/55 145/81 144/89  Pulse: 61 70 64 62  Temp: 98.4 F (36.9 C) 98.5 F (36.9 C) 98.5 F (36.9 C) 98.6 F (37 C)  TempSrc: Oral Oral Oral Oral  Resp: $Remo'16 18 18 18  'mnoXy$ Height:      Weight:      SpO2: 100% 100%  100%    Physical Exam Persistent effusion  Compartments soft   SILT DP/SP/S/S/T, 2+DP, +TA/GS/EHL  LABS  Results for orders placed during the hospital encounter of 09/12/13 (from the past 24 hour(s))  CBC WITH DIFFERENTIAL     Status: Abnormal   Collection Time    09/12/13 12:15 PM      Result Value Ref Range   WBC 3.9 (*) 4.0 - 10.5 K/uL   RBC 2.95 (*) 3.87 - 5.11 MIL/uL   Hemoglobin 7.5 (*) 12.0 - 15.0 g/dL   HCT 22.7 (*) 36.0 - 46.0 %   MCV 76.9 (*) 78.0 - 100.0 fL   MCH 25.4 (*) 26.0 - 34.0 pg   MCHC 33.0  30.0 - 36.0 g/dL   RDW 14.1  11.5 - 15.5 %   Platelets 268  150 - 400 K/uL   Neutrophils Relative % 68  43 - 77 %   Neutro Abs 2.7  1.7 - 7.7 K/uL   Lymphocytes Relative 20  12 - 46 %   Lymphs Abs 0.8  0.7 - 4.0 K/uL   Monocytes Relative 8  3 - 12 %   Monocytes Absolute 0.3  0.1 - 1.0 K/uL   Eosinophils Relative 3  0 - 5 %   Eosinophils Absolute 0.1  0.0 - 0.7 K/uL   Basophils Relative 1  0 - 1 %   Basophils Absolute 0.0  0.0 - 0.1 K/uL  BASIC METABOLIC PANEL     Status: Abnormal   Collection Time    09/12/13 12:15 PM      Result Value Ref Range   Sodium 139  137 - 147 mEq/L   Potassium 3.1 (*) 3.7 - 5.3 mEq/L   Chloride 105  96 - 112 mEq/L   CO2 24  19 - 32 mEq/L   Glucose, Bld 104 (*) 70 - 99 mg/dL   BUN 37 (*) 6 - 23 mg/dL   Creatinine, Ser 1.97 (*) 0.50 - 1.10 mg/dL   Calcium 8.2 (*) 8.4 - 10.5 mg/dL   GFR calc non Af Amer 31 (*) >90 mL/min   GFR calc Af Amer 35 (*) >90 mL/min   Anion gap 10  5 - 15  D-DIMER, QUANTITATIVE     Status: Abnormal   Collection Time    09/12/13 12:15 PM       Result Value Ref Range   D-Dimer, Quant 0.90 (*) 0.00 - 0.48 ug/mL-FEU  PREPARE RBC (CROSSMATCH)     Status: None   Collection Time    09/12/13  4:00 PM      Result Value Ref Range   Order Confirmation ORDER PROCESSED BY BLOOD BANK    POC OCCULT BLOOD, ED     Status: None   Collection Time    09/12/13  4:06 PM      Result Value Ref Range   Fecal Occult Bld NEGATIVE  NEGATIVE  TYPE AND SCREEN     Status: None   Collection Time  09/12/13  4:23 PM      Result Value Ref Range   ABO/RH(D) O NEG     Antibody Screen NEG     Sample Expiration 09/15/2013     Unit Number [id number]     Blood Component Type RED CELLS,LR     Unit division 00     Status of Unit ISSUED,FINAL     Transfusion Status OK TO TRANSFUSE     Crossmatch Result Compatible     Unit Number [id number]     Blood Component Type RED CELLS,LR     Unit division 00     Status of Unit ISSUED     Transfusion Status OK TO TRANSFUSE     Crossmatch Result Compatible     Unit Number [id number]     Blood Component Type RED CELLS,LR     Unit division 00     Status of Unit ALLOCATED     Transfusion Status OK TO TRANSFUSE     Crossmatch Result Compatible     Unit Number [id number]     Blood Component Type RED CELLS,LR     Unit division 00     Status of Unit ALLOCATED     Transfusion Status OK TO TRANSFUSE     Crossmatch Result Compatible    GLUCOSE, CAPILLARY     Status: None   Collection Time    09/13/13  7:55 AM      Result Value Ref Range   Glucose-Capillary 89  70 - 99 mg/dL     Assessment/Plan: Active Problems:   Shortness of breath   HTN (hypertension)   DM (diabetes mellitus)   Glomerulonephritis   Symptomatic anemia   CKD (chronic kidney disease)   Effusion of left knee   PLAN: Weight Bearing: WBAT I performed an asp/injection as per below. Dressings: remove bandaid tomorrow F/u Fluid analysis Dispo: D/c per primary  F/u with me in 2 wks pending fluid results  09/13/2013  8:51  AM  PATIENT:  Patricia [name]    PRE-PROCEDURE DIAGNOSIS:  L knee effusion  POST-OPERATIVE DIAGNOSIS:  Same  PROCEDURE:  Intra-articular aspiration and injection L knee  PROCEDURE DETAILS:  After informed verbal consent was obtained the superolateral portal was prepped with alcohol and the knee was aspirated, yielding a total of 100 ml and 4 ml of Marcaine and 1 ml of Depo-Medrol ($RemoveBeforeD'40mg'YdSruFDCOJokkt$ ) was injected. She tolerated this well and a Band-Aid was placed. Fluid was serosanguinouos    Alveria Mcglaughlin, D 09/13/2013, 8:51 AM   [name], MD Cell [phone]

## 2013-09-13 NOTE — Progress Notes (Signed)
Ordered to hold laxis until both units of blood are completed- patient's b/p is too low (breath sounds clear bil, asymptomatic). Second unit starting soon.

## 2013-09-13 NOTE — Progress Notes (Signed)
Patient is throwing up- only has p.o. Phenergan- NP paged to have it changed to IV. Awaiting order

## 2013-09-14 LAB — TYPE AND SCREEN
ABO/RH(D): O NEG
ANTIBODY SCREEN: NEGATIVE
UNIT DIVISION: 0
Unit division: 0
Unit division: 0
Unit division: 0

## 2013-09-14 LAB — CBC
HEMATOCRIT: 29.2 % — AB (ref 36.0–46.0)
HEMOGLOBIN: 9.9 g/dL — AB (ref 12.0–15.0)
MCH: 27 pg (ref 26.0–34.0)
MCHC: 33.9 g/dL (ref 30.0–36.0)
MCV: 79.8 fL (ref 78.0–100.0)
Platelets: 256 10*3/uL (ref 150–400)
RBC: 3.66 MIL/uL — ABNORMAL LOW (ref 3.87–5.11)
RDW: 14.1 % (ref 11.5–15.5)
WBC: 5.8 10*3/uL (ref 4.0–10.5)

## 2013-09-14 LAB — BASIC METABOLIC PANEL
Anion gap: 12 (ref 5–15)
BUN: 42 mg/dL — AB (ref 6–23)
CHLORIDE: 105 meq/L (ref 96–112)
CO2: 24 mEq/L (ref 19–32)
CREATININE: 2.22 mg/dL — AB (ref 0.50–1.10)
Calcium: 8.1 mg/dL — ABNORMAL LOW (ref 8.4–10.5)
GFR calc Af Amer: 31 mL/min — ABNORMAL LOW (ref >90)
GFR, EST NON AFRICAN AMERICAN: 27 mL/min — AB (ref >90)
Glucose, Bld: 98 mg/dL (ref 70–99)
Potassium: 3.4 mEq/L — ABNORMAL LOW (ref 3.7–5.3)
Sodium: 141 mEq/L (ref 137–147)

## 2013-09-14 LAB — MAGNESIUM: Magnesium: 1.8 mg/dL (ref 1.5–2.5)

## 2013-09-14 MED ORDER — PROMETHAZINE HCL 25 MG PO TABS: 25.0000 mg | ORAL_TABLET | Freq: Four times a day (QID) | ORAL | Status: DC | PRN

## 2013-09-14 MED ORDER — OXYCODONE-ACETAMINOPHEN 5-325 MG PO TABS: 1.0000 | ORAL_TABLET | ORAL | Status: DC | PRN

## 2013-09-14 MED ORDER — POTASSIUM CHLORIDE CRYS ER 20 MEQ PO TBCR
40.0000 meq | EXTENDED_RELEASE_TABLET | Freq: Once | ORAL | Status: AC
  Administered 2013-09-14: 40 meq via ORAL
  Filled 2013-09-14: qty 2

## 2013-09-14 NOTE — Discharge Summary (Signed)
Physician Discharge Summary  Patricia [name] GYF:[id number] DOB: 1975/08/18 DOA: 09/12/2013  PCP: Tereasa Coop, PA-C  Admit date: 09/12/2013 Discharge date: 09/14/2013  Recommendations for Outpatient Follow-up:  1. Pt will need to follow up with PCP in 2-3 weeks post discharge 2. Please obtain BMP to evaluate electrolytes and kidney function 3. Please also check CBC to evaluate Hg and Hct levels 4. Please note I was called by ortho team, pt has knee aspiration and has gout, asked to forward this to PCP to determine appropriate treatment due to CKD  5. Did not give colchicine for gout due to CKD   Discharge Diagnoses:  Active Problems:   Shortness of breath   HTN (hypertension)   DM (diabetes mellitus)   Glomerulonephritis   Symptomatic anemia   CKD (chronic kidney disease)   Effusion of left knee  Discharge Condition: Stable  Diet recommendation: Renal diet   Brief narrative:  38 y.o. female, with known history of chronic kidney disease baseline creatinine of 2, secondary to nephrotic syndrome and minimal changes glomerulonephritis, followed by [facility] nephrology, presented with shortness of breath, found to have hemoglobin of 7.5, which is a drop from her baseline, she has been gradually trending down from 11 few month ago.   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 Suprapatellar effusion.  Nm Pulmonary Perf And Vent 09/12/2013 Low probability pulmonary embolus.  Medical Consultants:   Ortho  Other Consultants:   None  Anti-Infectives:   None   Discharge Exam: Filed Vitals:   09/14/13 0553  BP: 128/66  Pulse: 70  Temp: 98.6 F (37 C)  Resp: 16   Filed Vitals:   09/13/13 0815 09/13/13 1408 09/13/13 2108 09/14/13 0553  BP: 144/89 125/66 124/66 128/66  Pulse: 62 74 64 70  Temp: 98.6 F (37 C) 98.7 F (37.1 C) 98.7 F (37.1 C) 98.6 F (37 C)  TempSrc: Oral Oral Oral Oral  Resp: $Remo'18 16 16 16  'ktHDB$ Height:      Weight:      SpO2: 100% 98% 98% 98%    General: Pt is alert, follows commands appropriately, not in acute distress Cardiovascular: Regular rate and rhythm, S1/S2 +, no murmurs, no rubs, no gallops Respiratory: Clear to auscultation bilaterally, no wheezing, no crackles, no rhonchi Abdominal: Soft, non tender, non distended, bowel sounds +, no guarding Extremities: no edema,  no cyanosis, pulses palpable bilaterally DP and PT Neuro: Grossly nonfocal  Discharge Instructions  Discharge Instructions   Diet - low sodium heart healthy    Complete by:  As directed      Increase activity slowly    Complete by:  As directed             Medication List    STOP taking these medications       HYDROcodone-acetaminophen 5-325 MG per tablet  Commonly known as:  NORCO/VICODIN      TAKE these medications       atorvastatin 20 MG tablet  Commonly known as:  LIPITOR  Take 20 mg by mouth at bedtime.     cyclophosphamide  25 MG tablet  Commonly known as:  CYTOXAN  Take 100 mg by mouth daily. Give on an empty stomach 1 hour before or 2 hours after meals.     lisinopril 10 MG tablet  Commonly known as:  PRINIVIL,ZESTRIL  Take 10 mg by mouth daily.     LORazepam 0.5 MG tablet  Commonly known as:  ATIVAN  Take 0.5 mg by mouth every 6 (six) hours as needed for anxiety.     metolazone 5 MG tablet  Commonly known as:  ZAROXOLYN  Take 5 mg by mouth daily.     multivitamin with minerals Tabs tablet  Take 1 tablet by mouth daily.     omeprazole 40 MG capsule  Commonly known as:  PRILOSEC  Take 40 mg by mouth daily.     oxyCODONE-acetaminophen 5-325 MG per tablet  Commonly known as:  PERCOCET/ROXICET  Take 1-2 tablets by mouth every 4 (four) hours as needed for moderate pain.     promethazine 25 MG tablet  Commonly known as:  PHENERGAN  Take 1 tablet (25 mg total) by mouth every 6 (six) hours as needed for nausea or vomiting.     torsemide 20 MG tablet  Commonly known as:  DEMADEX  Take 80 mg by mouth daily.     vitamin C 500 MG tablet  Commonly known as:  ASCORBIC ACID  Take 500 mg by mouth daily.           Follow-up Information   Schedule an appointment as soon as possible for a visit with LONG,ASHLEY B, PA-C.   Specialty:  Physician Assistant   Contact information:   [place] [address] [phone]       Follow up with Faye Ramsay, MD. (As needed call my cell phone [phone])    Specialty:  Internal Medicine   Contact information:   [address] [place] [address] [phone]        The results of significant diagnostics from this hospitalization (including imaging, microbiology, ancillary and laboratory) are listed below for reference.     Microbiology: Recent Results (from the past 240 hour(s))  BODY FLUID CULTURE     Status: None   Collection Time    09/13/13  8:54 AM      Result Value Ref Range Status    Specimen Description SYNOVIAL FLUID KNEE LEFT   Final   Special Requests 50ML FLUID   Final   Gram Stain     Final   Value: FEW WBC PRESENT,BOTH PMN AND MONONUCLEAR     NO ORGANISMS SEEN     Performed at [facility]   Culture PENDING   Incomplete   Report Status PENDING   Incomplete  Labs: Basic Metabolic Panel:  Recent Labs Lab 09/12/13 1215 09/13/13 0904 09/14/13 0730  NA 139 140 141  K 3.1* 3.0* 3.4*  CL 105 105 105  CO2 $Re'24 25 24  'qvW$ GLUCOSE 104* 118* 98  BUN 37* 36* 42*  CREATININE 1.97* 1.84* 2.22*  CALCIUM 8.2* 7.9* 8.1*  MG  --   --  1.8   Liver Function Tests: No results found for this basename: AST, ALT, ALKPHOS, BILITOT, PROT, ALBUMIN,  in the last 168 hours No results found for this basename: LIPASE, AMYLASE,  in the last 168 hours No results found for this basename: AMMONIA,  in the last 168 hours CBC:  Recent Labs Lab 09/12/13 1215 09/13/13 0904 09/14/13 0730  WBC 3.9* 3.9* 5.8  NEUTROABS 2.7  --   --   HGB 7.5* 9.8* 9.9*  HCT 22.7* 28.9* 29.2*  MCV 76.9* 80.5 79.8  PLT 268 221 256   Cardiac Enzymes: No results found for this basename: CKTOTAL, CKMB, CKMBINDEX, TROPONINI,  in the last 168 hours BNP: BNP (last 3 results)  Recent Labs  01/27/13 2157 06/29/13 1426 08/21/13 0739  PROBNP 262.9* 313.9* 358.8*   CBG:  Recent Labs Lab 09/13/13 0755  GLUCAP 89     SIGNED: Time coordinating discharge: Over 30 minutes  Faye Ramsay, MD  [facility] 09/14/2013, 9:27 AM Pager [phone]  If 7PM-7AM, please contact night-coverage [email or web address] Password [email or web address]

## 2013-09-14 NOTE — Care Management Note (Signed)
    Page 1 of 1   09/14/2013     10:59:07 AM CARE MANAGEMENT NOTE 09/14/2013  Patient:  Patricia [name],Patricia [name]   Account Number:  [id number]  Date Initiated:  09/12/2013  Documentation initiated by:  [facility]  Subjective/Objective Assessment:   anemia     Action/Plan:   Anticipated DC Date:  09/14/2013   Anticipated DC Plan:  [place]  CM consult      Choice offered to / List presented to:             Status of service:  Completed, signed off Medicare Important Message given?  NO (If response is "NO", the following Medicare IM given date fields will be blank) Date Medicare IM given:   Medicare IM given by:   Date Additional Medicare IM given:   Additional Medicare IM given by:    Discharge Disposition:  HOME/SELF CARE  Per UR Regulation:  Reviewed for med. necessity/level of care/duration of stay  If discussed at [place] of Stay Meetings, dates discussed:    Comments:

## 2013-09-14 NOTE — Discharge Instructions (Signed)
Anemia, Nonspecific Anemia is a condition in which the concentration of red blood cells or hemoglobin in the blood is below normal. Hemoglobin is a substance in red blood cells that carries oxygen to the tissues of the body. Anemia results in not enough oxygen reaching these tissues.  CAUSES  Common causes of anemia include:   Excessive bleeding. Bleeding may be internal or external. This includes excessive bleeding from periods (in women) or from the intestine.   Poor nutrition.   Chronic kidney, thyroid, and liver disease.  Bone marrow disorders that decrease red blood cell production.  Cancer and treatments for cancer.  HIV, AIDS, and their treatments.  Spleen problems that increase red blood cell destruction.  Blood disorders.  Excess destruction of red blood cells due to infection, medicines, and autoimmune disorders. SIGNS AND SYMPTOMS   Minor weakness.   Dizziness.   Headache.  Palpitations.   Shortness of breath, especially with exercise.   Paleness.  Cold sensitivity.  Indigestion.  Nausea.  Difficulty sleeping.  Difficulty concentrating. Symptoms may occur suddenly or they may develop slowly.  DIAGNOSIS  Additional blood tests are often needed. These help your health care provider determine the best treatment. Your health care provider will check your stool for blood and look for other causes of blood loss.  TREATMENT  Treatment varies depending on the cause of the anemia. Treatment can include:   Supplements of iron, vitamin B12, or folic acid.   Hormone medicines.   A blood transfusion. This may be needed if blood loss is severe.   Hospitalization. This may be needed if there is significant continual blood loss.   Dietary changes.  Spleen removal. HOME CARE INSTRUCTIONS Keep all follow-up appointments. It often takes many weeks to correct anemia, and having your health care provider check on your condition and your response to  treatment is very important. SEEK IMMEDIATE MEDICAL CARE IF:   You develop extreme weakness, shortness of breath, or chest pain.   You become dizzy or have trouble concentrating.  You develop heavy vaginal bleeding.   You develop a rash.   You have bloody or black, tarry stools.   You faint.   You vomit up blood.   You vomit repeatedly.   You have abdominal pain.  You have a fever or persistent symptoms for more than 2-3 days.   You have a fever and your symptoms suddenly get worse.   You are dehydrated.  MAKE SURE YOU:  Understand these instructions.  Will watch your condition.  Will get help right away if you are not doing well or get worse. Document Released: 01/30/2004 Document Revised: 08/24/2012 Document Reviewed: 06/17/2012 Exit[employer] Patient Information 2015 [employer]. This information is not intended to replace advice given to you by your health care provider. Make sure you discuss any questions you have with your health care provider.  

## 2013-09-14 NOTE — Progress Notes (Signed)
Pt given discharge instructions and prescriptions.  PIV removed.  Pt taken to discharge location via wheelchair.

## 2013-09-16 LAB — BODY FLUID CULTURE: Culture: NO GROWTH

## 2013-09-27 ENCOUNTER — Inpatient Hospital Stay (HOSPITAL_COMMUNITY)
Admission: EM | Admit: 2013-09-27 | Discharge: 2013-10-03 | DRG: 311 | Disposition: A | Discharge status: Home / Self Care | Payer: Medicaid Other | Attending: Internal Medicine | Admitting: Internal Medicine

## 2013-09-27 ENCOUNTER — Emergency Department (HOSPITAL_COMMUNITY): Payer: Medicaid Other

## 2013-09-27 DIAGNOSIS — N183 Chronic kidney disease, stage 3 unspecified: Secondary | ICD-10-CM

## 2013-09-27 DIAGNOSIS — I209 Angina pectoris, unspecified: Principal | ICD-10-CM | POA: Diagnosis present

## 2013-09-27 DIAGNOSIS — I129 Hypertensive chronic kidney disease with stage 1 through stage 4 chronic kidney disease, or unspecified chronic kidney disease: Secondary | ICD-10-CM | POA: Diagnosis present

## 2013-09-27 DIAGNOSIS — R42 Dizziness and giddiness: Secondary | ICD-10-CM

## 2013-09-27 DIAGNOSIS — T502X5A Adverse effect of carbonic-anhydrase inhibitors, benzothiadiazides and other diuretics, initial encounter: Secondary | ICD-10-CM | POA: Diagnosis present

## 2013-09-27 DIAGNOSIS — I471 Supraventricular tachycardia, unspecified: Secondary | ICD-10-CM | POA: Diagnosis present

## 2013-09-27 DIAGNOSIS — I4719 Other supraventricular tachycardia: Secondary | ICD-10-CM

## 2013-09-27 DIAGNOSIS — N049 Nephrotic syndrome with unspecified morphologic changes: Secondary | ICD-10-CM

## 2013-09-27 DIAGNOSIS — I10 Essential (primary) hypertension: Secondary | ICD-10-CM

## 2013-09-27 DIAGNOSIS — I151 Hypertension secondary to other renal disorders: Secondary | ICD-10-CM

## 2013-09-27 DIAGNOSIS — R079 Chest pain, unspecified: Secondary | ICD-10-CM

## 2013-09-27 DIAGNOSIS — N2889 Other specified disorders of kidney and ureter: Secondary | ICD-10-CM

## 2013-09-27 DIAGNOSIS — I951 Orthostatic hypotension: Secondary | ICD-10-CM

## 2013-09-27 DIAGNOSIS — Z6838 Body mass index (BMI) 38.0-38.9, adult: Secondary | ICD-10-CM

## 2013-09-27 DIAGNOSIS — Z886 Allergy status to analgesic agent status: Secondary | ICD-10-CM

## 2013-09-27 DIAGNOSIS — E785 Hyperlipidemia, unspecified: Secondary | ICD-10-CM

## 2013-09-27 DIAGNOSIS — R609 Edema, unspecified: Secondary | ICD-10-CM

## 2013-09-27 DIAGNOSIS — D649 Anemia, unspecified: Secondary | ICD-10-CM | POA: Diagnosis present

## 2013-09-27 DIAGNOSIS — Z8249 Family history of ischemic heart disease and other diseases of the circulatory system: Secondary | ICD-10-CM

## 2013-09-27 DIAGNOSIS — N059 Unspecified nephritic syndrome with unspecified morphologic changes: Secondary | ICD-10-CM

## 2013-09-27 DIAGNOSIS — N179 Acute kidney failure, unspecified: Secondary | ICD-10-CM

## 2013-09-27 DIAGNOSIS — E7800 Pure hypercholesterolemia, unspecified: Secondary | ICD-10-CM | POA: Diagnosis present

## 2013-09-27 DIAGNOSIS — Z833 Family history of diabetes mellitus: Secondary | ICD-10-CM

## 2013-09-27 DIAGNOSIS — E119 Type 2 diabetes mellitus without complications: Secondary | ICD-10-CM | POA: Diagnosis present

## 2013-09-27 LAB — URINALYSIS, ROUTINE W REFLEX MICROSCOPIC
Bilirubin Urine: NEGATIVE
Glucose, UA: 250 mg/dL — AB
KETONES UR: NEGATIVE mg/dL
LEUKOCYTES UA: NEGATIVE
Nitrite: NEGATIVE
PH: 7 (ref 5.0–8.0)
Protein, ur: >300 mg/dL — AB
Specific Gravity, Urine: 1.018 (ref 1.005–1.030)
Urobilinogen, UA: 0.2 mg/dL (ref 0.0–1.0)

## 2013-09-27 LAB — CBC
HCT: 30 % — ABNORMAL LOW (ref 36.0–46.0)
HEMATOCRIT: 30.8 % — AB (ref 36.0–46.0)
Hemoglobin: 10.4 g/dL — ABNORMAL LOW (ref 12.0–15.0)
Hemoglobin: 10.1 g/dL — ABNORMAL LOW (ref 12.0–15.0)
MCH: 26.7 pg (ref 26.0–34.0)
MCH: 26.4 pg (ref 26.0–34.0)
MCHC: 33.8 g/dL (ref 30.0–36.0)
MCHC: 33.7 g/dL (ref 30.0–36.0)
MCV: 79.2 fL (ref 78.0–100.0)
MCV: 78.3 fL (ref 78.0–100.0)
PLATELETS: 250 10*3/uL (ref 150–400)
Platelets: 260 10*3/uL (ref 150–400)
RBC: 3.89 MIL/uL (ref 3.87–5.11)
RBC: 3.83 MIL/uL — ABNORMAL LOW (ref 3.87–5.11)
RDW: 14.2 % (ref 11.5–15.5)
RDW: 14 % (ref 11.5–15.5)
WBC: 4.3 10*3/uL (ref 4.0–10.5)
WBC: 4.2 10*3/uL (ref 4.0–10.5)

## 2013-09-27 LAB — BASIC METABOLIC PANEL
Anion gap: 11 (ref 5–15)
BUN: 42 mg/dL — ABNORMAL HIGH (ref 6–23)
CHLORIDE: 104 meq/L (ref 96–112)
CO2: 25 mEq/L (ref 19–32)
Calcium: 8.1 mg/dL — ABNORMAL LOW (ref 8.4–10.5)
Creatinine, Ser: 1.84 mg/dL — ABNORMAL HIGH (ref 0.50–1.10)
GFR calc non Af Amer: 33 mL/min — ABNORMAL LOW (ref >90)
GFR, EST AFRICAN AMERICAN: 39 mL/min — AB (ref >90)
Glucose, Bld: 117 mg/dL — ABNORMAL HIGH (ref 70–99)
Potassium: 3.1 mEq/L — ABNORMAL LOW (ref 3.7–5.3)
Sodium: 140 mEq/L (ref 137–147)

## 2013-09-27 LAB — URINE MICROSCOPIC-ADD ON

## 2013-09-27 LAB — PROTIME-INR
INR: 0.94 (ref 0.00–1.49)
Prothrombin Time: 12.6 seconds (ref 11.6–15.2)

## 2013-09-27 LAB — I-STAT TROPONIN, ED: Troponin i, poc: 0 ng/mL (ref 0.00–0.08)

## 2013-09-27 LAB — TROPONIN I

## 2013-09-27 MED ORDER — SODIUM CHLORIDE 0.9 % IJ SOLN
3.0000 mL | Freq: Two times a day (BID) | INTRAMUSCULAR | Status: DC
  Administered 2013-09-27 – 2013-10-02 (×7): 3 mL via INTRAVENOUS

## 2013-09-27 MED ORDER — ASPIRIN 81 MG PO CHEW
324.0000 mg | CHEWABLE_TABLET | Freq: Once | ORAL | Status: AC
  Administered 2013-09-27: 324 mg via ORAL
  Filled 2013-09-27: qty 4

## 2013-09-27 MED ORDER — ADULT MULTIVITAMIN W/MINERALS CH
1.0000 | ORAL_TABLET | Freq: Every day | ORAL | Status: DC
  Administered 2013-09-28 – 2013-10-03 (×6): 1 via ORAL
  Filled 2013-09-27 (×8): qty 1

## 2013-09-27 MED ORDER — HEPARIN SODIUM (PORCINE) 5000 UNIT/ML IJ SOLN
5000.0000 [IU] | Freq: Three times a day (TID) | INTRAMUSCULAR | Status: DC
  Administered 2013-09-27 – 2013-10-03 (×15): 5000 [IU] via SUBCUTANEOUS
  Filled 2013-09-27 (×20): qty 1

## 2013-09-27 MED ORDER — LISINOPRIL 10 MG PO TABS
10.0000 mg | ORAL_TABLET | Freq: Every day | ORAL | Status: DC
  Administered 2013-09-28 – 2013-09-29 (×2): 10 mg via ORAL
  Filled 2013-09-27 (×2): qty 1

## 2013-09-27 MED ORDER — PROMETHAZINE HCL 25 MG PO TABS
25.0000 mg | ORAL_TABLET | Freq: Four times a day (QID) | ORAL | Status: DC | PRN
  Administered 2013-09-28: 25 mg via ORAL
  Filled 2013-09-27: qty 1

## 2013-09-27 MED ORDER — OXYCODONE-ACETAMINOPHEN 5-325 MG PO TABS
1.0000 | ORAL_TABLET | ORAL | Status: DC | PRN
  Administered 2013-09-28: 1 via ORAL
  Administered 2013-09-29: 2 via ORAL
  Administered 2013-10-01: 1 via ORAL
  Filled 2013-09-27 (×2): qty 1
  Filled 2013-09-27 (×2): qty 2

## 2013-09-27 MED ORDER — SODIUM CHLORIDE 0.9 % IV SOLN: 250.0000 mL | INTRAVENOUS | Status: DC | PRN

## 2013-09-27 MED ORDER — LORAZEPAM 0.5 MG PO TABS
0.5000 mg | ORAL_TABLET | Freq: Four times a day (QID) | ORAL | Status: DC | PRN
  Administered 2013-09-28: 0.5 mg via ORAL
  Filled 2013-09-27 (×2): qty 1

## 2013-09-27 MED ORDER — ATORVASTATIN CALCIUM 20 MG PO TABS
20.0000 mg | ORAL_TABLET | Freq: Every day | ORAL | Status: DC
  Administered 2013-09-27 – 2013-10-02 (×6): 20 mg via ORAL
  Filled 2013-09-27 (×7): qty 1

## 2013-09-27 MED ORDER — METOPROLOL TARTRATE 12.5 MG HALF TABLET
12.5000 mg | ORAL_TABLET | Freq: Two times a day (BID) | ORAL | Status: DC
  Administered 2013-09-27 – 2013-09-29 (×4): 12.5 mg via ORAL
  Filled 2013-09-27 (×5): qty 1

## 2013-09-27 MED ORDER — MORPHINE SULFATE 2 MG/ML IJ SOLN
2.0000 mg | INTRAMUSCULAR | Status: DC | PRN
  Administered 2013-09-27 – 2013-09-28 (×2): 2 mg via INTRAVENOUS
  Filled 2013-09-27 (×3): qty 1

## 2013-09-27 MED ORDER — PANTOPRAZOLE SODIUM 40 MG PO TBEC
40.0000 mg | DELAYED_RELEASE_TABLET | Freq: Every day | ORAL | Status: DC
Start: 2013-09-27 — End: 2013-10-03
  Administered 2013-09-27 – 2013-10-03 (×7): 40 mg via ORAL
  Filled 2013-09-27 (×7): qty 1

## 2013-09-27 MED ORDER — SODIUM CHLORIDE 0.9 % IJ SOLN
3.0000 mL | INTRAMUSCULAR | Status: DC | PRN
Start: 2013-09-27 — End: 2013-10-02

## 2013-09-27 MED ORDER — NITROGLYCERIN 0.4 MG SL SUBL
0.4000 mg | SUBLINGUAL_TABLET | SUBLINGUAL | Status: AC | PRN
  Administered 2013-09-27 (×3): 0.4 mg via SUBLINGUAL
  Filled 2013-09-27 (×3): qty 1

## 2013-09-27 MED ORDER — POTASSIUM CHLORIDE CRYS ER 20 MEQ PO TBCR
40.0000 meq | EXTENDED_RELEASE_TABLET | Freq: Once | ORAL | Status: AC
  Administered 2013-09-27: 40 meq via ORAL
  Filled 2013-09-27: qty 2

## 2013-09-27 MED ORDER — TORSEMIDE 20 MG PO TABS
80.0000 mg | ORAL_TABLET | Freq: Every day | ORAL | Status: DC
  Administered 2013-09-28: 80 mg via ORAL
  Filled 2013-09-27 (×2): qty 4

## 2013-09-27 MED ORDER — MORPHINE SULFATE 4 MG/ML IJ SOLN
4.0000 mg | Freq: Once | INTRAMUSCULAR | Status: AC
Start: 2013-09-27 — End: 2013-09-27
  Administered 2013-09-27: 4 mg via INTRAVENOUS
  Filled 2013-09-27: qty 1

## 2013-09-27 MED ORDER — ASPIRIN 325 MG PO TABS
325.0000 mg | ORAL_TABLET | Freq: Every day | ORAL | Status: DC
  Administered 2013-09-28 – 2013-10-02 (×5): 325 mg via ORAL
  Filled 2013-09-27 (×6): qty 1

## 2013-09-27 MED ORDER — METOLAZONE 5 MG PO TABS
5.0000 mg | ORAL_TABLET | Freq: Every day | ORAL | Status: DC
  Administered 2013-09-28: 5 mg via ORAL
  Filled 2013-09-27 (×2): qty 1

## 2013-09-27 MED ORDER — ONDANSETRON 4 MG PO TBDP
8.0000 mg | ORAL_TABLET | Freq: Once | ORAL | Status: AC
  Administered 2013-09-27: 8 mg via ORAL
  Filled 2013-09-27: qty 2

## 2013-09-27 MED ORDER — VITAMIN C 500 MG PO TABS
500.0000 mg | ORAL_TABLET | Freq: Every day | ORAL | Status: DC
  Administered 2013-09-28 – 2013-10-03 (×6): 500 mg via ORAL
  Filled 2013-09-27 (×6): qty 1

## 2013-09-27 MED ORDER — SODIUM CHLORIDE 0.9 % IJ SOLN
3.0000 mL | Freq: Two times a day (BID) | INTRAMUSCULAR | Status: DC
  Administered 2013-09-28 – 2013-10-02 (×4): 3 mL via INTRAVENOUS

## 2013-09-27 NOTE — ED Notes (Signed)
Pt states that she has had chest pain for several days. Pt states that today she felt dizzy and lightheaded while walking. Pt states that she also has a fluttering sensation in her chest.

## 2013-09-27 NOTE — ED Notes (Signed)
Attempted report x1. 

## 2013-09-27 NOTE — H&P (Signed)
[facility] History and Physical  Patricia [name] ZOX:[id number] DOB: [birth year] DOA: 09/27/2013  Referring physician: ED physician PCP: Tereasa Coop, PA-C  Specialists:   Chief Complaint: Chest pain  HPI: Patricia [name] is a 38 y.o. female with PMH of HTN, HLD, DM-II, chest pain, SOB, glomerulonephritis, who presents with chest pain.  Patient has intermittent chest pain in the past 3 days. It is located at left lower chest, pressure-like, 5 of 10 in severity. It happens every 2 or 3 hours, each time lasted for few minutes. It radiates to the left shoulder and left jaw. It is not aggravated or alleviated by any known factors. It is not pleuritic, deep breath does not make the chest pain worse. She has mild dry cough. No fever or chills. She has very mild shortness of breath. She also reports chronic mild tenderness over her calf areas bilaterally (left is worse than the right) which she attributed to chronic leg swelling. There is no significant change recently. No recent long distance traveling history. Patient has mild nausea, but no vomiting. She has very mild chronic abdominal pain over suprapubic area which has not changed in nature. She does not have diarrhea, dysuria, rashes.   Patient was found to have negative chest x-ray for pna, and no leukocytosis in ED. She is admitted to telemetry bed for observation and chest pain rule out.   Review of Systems: As presented in the history of presenting illness, rest negative.  Where does patient live?  Lives with her daughter in [place] Can patient participate in ADLs? Yes  Allergy:  Allergies  Allergen Reactions  . Nsaids Other (See Comments)    Cannot take due to Kidney disease    Past Medical History  Diagnosis Date  . Hypertension   . Acid reflux   . High cholesterol   . Diabetes mellitus     "took me off my RX ~ 04/2013"  . Anemia   . History of blood transfusion     "related to low counts"  . Headache(784.0)    "related to chemo; sometimes weekly" (09/12/2013)  . Chronic kidney disease (CKD), stage III (moderate)   . 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 Ankle fracture surgery Right 1994  . Abdominal hysterectomy  2010    "laparoscopic"  . Cardiac catheterization  2000's    Social History:  reports that she has never smoked. She has never used smokeless tobacco. She reports that she does not drink alcohol or use illicit drugs.  Family History:  Family History  Problem Relation Age of Onset  . Hypertension Mother   . Thyroid disease Mother   . Coronary artery disease Father   . Hypertension Father   . Diabetes Father      Prior to Admission medications   Medication Sig Start Date End Date Taking? Authorizing Provider  atorvastatin (LIPITOR) 20 MG tablet Take 20 mg by mouth at bedtime.   Yes Historical Provider, MD  lisinopril (PRINIVIL,ZESTRIL) 10 MG tablet Take 10 mg by mouth daily. Hold if BP <130   Yes Historical Provider, MD  LORazepam (ATIVAN) 0.5 MG tablet Take 0.5 mg by mouth every 6 (six) hours as needed for anxiety.   Yes Historical Provider, MD  metolazone (ZAROXOLYN) 5 MG tablet Take 5 mg by mouth daily.   Yes Historical Provider, MD  Multiple Vitamin (MULTIVITAMIN WITH MINERALS) TABS tablet Take 1 tablet  by mouth daily.   Yes Historical Provider, MD  omeprazole (PRILOSEC) 40 MG capsule Take 40 mg by mouth daily.     Yes Historical Provider, MD  oxyCODONE-acetaminophen (PERCOCET/ROXICET) 5-325 MG per tablet Take 1-2 tablets by mouth every 4 (four) hours as needed for moderate pain. 09/14/13  Yes Theodis Blaze, MD  promethazine (PHENERGAN) 25 MG tablet Take 1 tablet (25 mg total) by mouth every 6 (six) hours as needed for nausea or vomiting. 09/14/13  Yes Theodis Blaze, MD  torsemide (DEMADEX) 20 MG tablet Take 80 mg by mouth daily.    Yes Historical Provider, MD  vitamin C (ASCORBIC ACID)  500 MG tablet Take 500 mg by mouth daily.   Yes Historical Provider, MD    Physical Exam: Filed Vitals:   09/27/13 2031 09/27/13 2055 09/27/13 2308 09/27/13 2311  BP: 117/71 121/68  130/66  Pulse: 63 56  57  Temp:  98 F (36.7 C)  97.4 F (36.3 C)  TempSrc:  Oral  Oral  Resp: 13     Height:      Weight:   109.816 kg (242 lb 1.6 oz)   SpO2: 100% 100%  100%   General: Not in acute distress HEENT:       Eyes: PERRL, EOMI, no scleral icterus       ENT: No discharge from the ears and nose, no pharynx injection, no tonsillar enlargement.        Neck: No JVD, no bruit, no mass felt. Cardiac: S1/S2, RRR, No murmurs, gallops or rubs Pulm: Good air movement bilaterally. Clear to auscultation bilaterally. No rales, wheezing, rhonchi or rubs. Abd: Soft, nondistended, nontender, no rebound pain, no organomegaly, BS present Ext: No edema. 2+DP/PT pulse bilaterally. There is mild tenderness over both calf areas (L>R), but no cord detected.  Musculoskeletal: No joint deformities, erythema, or stiffness, ROM full Skin: No rashes.  Neuro: Alert and oriented X3, cranial nerves II-XII grossly intact, muscle strength 5/5 in all extremeties, sensation to light touch intact. Psych: Patient is not psychotic, no suicidal or hemocidal ideation.  Labs on Admission:  Basic Metabolic Panel:  Recent Labs Lab 09/27/13 1440  NA 140  K 3.1*  CL 104  CO2 25  GLUCOSE 117*  BUN 42*  CREATININE 1.84*  CALCIUM 8.1*   Liver Function Tests: No results found for this basename: AST, ALT, ALKPHOS, BILITOT, PROT, ALBUMIN,  in the last 168 hours No results found for this basename: LIPASE, AMYLASE,  in the last 168 hours No results found for this basename: AMMONIA,  in the last 168 hours CBC:  Recent Labs Lab 09/27/13 1440 09/27/13 2249  WBC 4.3 4.2  HGB 10.4* 10.1*  HCT 30.8* 30.0*  MCV 79.2 78.3  PLT 260 250   Cardiac Enzymes:  Recent Labs Lab 09/27/13 1913  TROPONINI <0.30    BNP (last 3  results)  Recent Labs  01/27/13 2157 06/29/13 1426 08/21/13 0739  PROBNP 262.9* 313.9* 358.8*   CBG: No results found for this basename: GLUCAP,  in the last 168 hours  Radiological Exams on Admission: Dg Chest 2 View  09/27/2013   CLINICAL DATA:  Shortness of breath  EXAM: CHEST  2 VIEW  COMPARISON:  08/21/2013  FINDINGS: The heart size and mediastinal contours are within normal limits. Both lungs are clear. The visualized skeletal structures are unremarkable.  IMPRESSION: No active cardiopulmonary disease.   Electronically Signed   By: Kathreen Devoid   On: 09/27/2013 16:27  EKG: Independently reviewed. Nonspecific T-wave flattening diffusely, no significant change compared with previous EKG.   Assessment/Plan Principal Problem:   Chest pain Active Problems:   HTN (hypertension)   Hypercholesterolemia   Glomerulonephritis   CKD (chronic kidney disease)  1. Chest pain: Patient has atypical chest pain. It is important to rule out ACS given her significant risk factors, including hypertension, hyperlipidemia and CKD. Another important differential diagnosis is pulmonary embolism. Patient has history of glomerulonephritis with significant proteinuria, which puts patient at risk of losing antithrombin III and developing clot. Patient reports having tenderness over her calf areas bilaterally. Although patient does not have typical signs of PE, such as pleuritic chest pain, and tachycardia, it can not be completely ruled out.  - Admit to telemetry bed for observation - Aspirin, metoprolol, Lipitor, morphine, nitroglycerin when necessary - Check d-dimer, if the d-dimer is positive, will get VQ scan - Lower extremity Dopplers to rule out DVT next  - check UDS - trop x 3 - repeat EKg in AM  2. glomerulonephritis: Patient has been followed up in [facility]. She completed chemotherapy with Cytoxan. Last dose was 2 weeks ago. Her creatinine is at her baseline. - check UA for proteinuria - follow  up BMP for monitoring renal function  3.Hypertension: Blood pressure 134/74 on admission - Continue home medications, including lisinopril, and metolazone and Torsemide  DVT ppx: SQ Heparin    Code Status: Full code Family Communication:  Yes, patient's daughter at bed side Disposition Plan: Admit to inpatient  Ivor Costa [facility] Pager [phone]  If 7PM-7AM, please contact night-coverage [email or web address] Password [facility] 09/28/2013, 2:48 AM

## 2013-09-27 NOTE — ED Provider Notes (Signed)
CSN: [id number]     Arrival date & time 09/27/13  1411 History   First MD Initiated Contact with Patient 09/27/13 1655     Chief Complaint  Patient presents with  . Chest Pain     (Consider location/radiation/quality/duration/timing/severity/associated sxs/prior Treatment) HPI  Patient with hx HTN, HL, DM, CCK p/w episode of near syncope, chest pain, SOB, palpitations that occurred today around 1:00pm.  States she has had intermittent chest pressure on the left anterior chest for several days.  It has been random, lasting hours at a time.  Today while she was standing in line at a store she developed lightheadedness, near syncope, increased left sided chest pressure with radiation into her left jaw and left shoulder, shortness of breath, and fluttering of her chest.  Symptoms were worse with walking around the store.  Currently she continues to have chest pressure (it resolved and returned), and feels generally weak.  Has had mild cough.  Denies fevers, current SOB, change in chronic leg swelling.  Was recently admitted for symptomatic anemia.  States this feels different from her anemia symptoms.   Past Medical History  Diagnosis Date  . Hypertension   . Acid reflux   . High cholesterol   . Diabetes mellitus     "took me off my RX ~ 04/2013"  . Anemia   . History of blood transfusion     "related to low counts"  . Headache(784.0)     "related to chemo; sometimes weekly" (09/12/2013)  . Chronic kidney disease (CKD), stage III (moderate)   . 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 Ankle fracture surgery Right 1994  . Abdominal hysterectomy  2010    "laparoscopic"  . Cardiac catheterization  2000's   Family History  Problem Relation Age of Onset  . Hypertension Mother   . Thyroid disease Mother   . Coronary artery disease Father   . Hypertension Father   . Diabetes Father    History   Substance Use Topics  . Smoking status: Never Smoker   . Smokeless tobacco: Never Used  . Alcohol Use: No   OB History   Grav Para Term Preterm Abortions TAB SAB Ect Mult Living                 Review of Systems  All other systems reviewed and are negative.     Allergies  Nsaids  Home Medications   Prior to Admission medications   Medication Sig Start Date End Date Taking? Authorizing Provider  atorvastatin (LIPITOR) 20 MG tablet Take 20 mg by mouth at bedtime.   Yes Historical Provider, MD  lisinopril (PRINIVIL,ZESTRIL) 10 MG tablet Take 10 mg by mouth daily. Hold if BP <130   Yes Historical Provider, MD  LORazepam (ATIVAN) 0.5 MG tablet Take 0.5 mg by mouth every 6 (six) hours as needed for anxiety.   Yes Historical Provider, MD  metolazone (ZAROXOLYN) 5 MG tablet Take 5 mg by mouth daily.   Yes Historical Provider, MD  Multiple Vitamin (MULTIVITAMIN WITH MINERALS) TABS tablet Take 1 tablet by mouth daily.   Yes Historical Provider, MD  omeprazole (PRILOSEC) 40 MG capsule Take 40 mg by mouth daily.     Yes Historical Provider, MD  oxyCODONE-acetaminophen (PERCOCET/ROXICET) 5-325 MG per tablet Take 1-2 tablets by mouth every 4 (four) hours as needed for moderate pain. 09/14/13  Yes Theodis Blaze, MD  promethazine (PHENERGAN) 25 MG tablet Take 1 tablet (25 mg total) by mouth every 6 (six) hours as needed for nausea or vomiting. 09/14/13  Yes Theodis Blaze, MD  torsemide (DEMADEX) 20 MG tablet Take 80 mg by mouth daily.    Yes Historical Provider, MD  vitamin C (ASCORBIC ACID) 500 MG tablet Take 500 mg by mouth daily.   Yes Historical Provider, MD   BP 137/77  Pulse 72  Temp(Src) 97.6 F (36.4 C) (Oral)  Resp 16  Ht $R'5\' 7"'kz$  (1.702 m)  Wt 230 lb (104.327 kg)  BMI 36.01 kg/m2  SpO2 100% Physical Exam  Nursing note and vitals reviewed. Constitutional: She appears well-developed and well-nourished. No distress.  HENT:  Head: Normocephalic and atraumatic.  Neck: Neck supple.   Cardiovascular: Normal rate and regular rhythm.   Pulmonary/Chest: Effort normal and breath sounds normal. No respiratory distress. She has no wheezes. She has no rales. She exhibits no tenderness.  Abdominal: Soft. She exhibits no distension. There is no tenderness. There is no rebound and no guarding.  Neurological: She is alert.  Skin: She is not diaphoretic.    ED Course  Procedures (including critical care time) Labs Review Labs Reviewed  CBC - Abnormal; Notable for the following:    Hemoglobin 10.4 (*)    HCT 30.8 (*)    All other components within normal limits  BASIC METABOLIC PANEL - Abnormal; Notable for the following:    Potassium 3.1 (*)    Glucose, Bld 117 (*)    BUN 42 (*)    Creatinine, Ser 1.84 (*)    Calcium 8.1 (*)    GFR calc non Af Amer 33 (*)    GFR calc Af Amer 39 (*)    All other components within normal limits  URINALYSIS, ROUTINE W REFLEX MICROSCOPIC - Abnormal; Notable for the following:    Glucose, UA 250 (*)    Hgb urine dipstick SMALL (*)    Protein, ur >300 (*)    All other components within normal limits  URINE MICROSCOPIC-ADD ON - Abnormal; Notable for the following:    Bacteria, UA FEW (*)    Casts HYALINE CASTS (*)    All other components within normal limits  CBC - Abnormal; Notable for the following:    RBC 3.83 (*)    Hemoglobin 10.1 (*)    HCT 30.0 (*)    All other components within normal limits  TROPONIN I  PROTIME-INR  URINE RAPID DRUG SCREEN (HOSP PERFORMED)  TROPONIN I  TROPONIN I  BASIC METABOLIC PANEL  CBC  D-DIMER, QUANTITATIVE  I-STAT TROPOININ, ED    Imaging Review Dg Chest 2 View  09/27/2013   CLINICAL DATA:  Shortness of breath  EXAM: CHEST  2 VIEW  COMPARISON:  08/21/2013  FINDINGS: The heart size and mediastinal contours are within normal limits. Both lungs are clear. The visualized skeletal structures are unremarkable.  IMPRESSION: No active cardiopulmonary disease.   Electronically Signed   By: Kathreen Devoid    On: 09/27/2013 16:27     EKG Interpretation   Date/Time:  Wednesday September 27 2013 14:18:35 EDT Ventricular Rate:  72 PR Interval:  158 QRS Duration: 90 QT Interval:  400 QTC Calculation: 438 R Axis:   39 Text Interpretation:  Normal sinus rhythm Cannot rule out Anterior infarct  , age undetermined Abnormal ECG No significant change since last tracing  Confirmed by YAO  MD, DAVID ([id number]) on 09/27/2013 5:35:29 PM  5:37 PM Discussed pt with Dr Darl Householder.   Carlisle Cater PM Admitted to [facility] for chest pain rule out MI.   MDM   Final diagnoses:  Chest pain, unspecified chest pain type    Patient with hypertension, hyperlipidemia, chronic kidney disease, diabetes and family history of coronary artery disease presents with intermittent chest pain over the past several days an episode this afternoon of near syncope, increased chest pain, shortness of breath, palpitations. EKG, chest x-ray, initial troponin negative, labs unremarkable/at baseline.   Recently admitted to symptomatic anemia.  Hgb 10.1 today.  Admitted to [facility] for chest pain    Clayton Bibles, PA-C 09/28/13 0037

## 2013-09-28 ENCOUNTER — Observation Stay (HOSPITAL_COMMUNITY): Payer: Medicaid Other

## 2013-09-28 ENCOUNTER — Encounter (HOSPITAL_COMMUNITY): Payer: Self-pay | Admitting: General Practice

## 2013-09-28 DIAGNOSIS — R42 Dizziness and giddiness: Secondary | ICD-10-CM

## 2013-09-28 DIAGNOSIS — M79609 Pain in unspecified limb: Secondary | ICD-10-CM

## 2013-09-28 DIAGNOSIS — G479 Sleep disorder, unspecified: Secondary | ICD-10-CM

## 2013-09-28 DIAGNOSIS — I150 Renovascular hypertension: Secondary | ICD-10-CM

## 2013-09-28 LAB — CBC
HCT: 29 % — ABNORMAL LOW (ref 36.0–46.0)
HEMOGLOBIN: 9.7 g/dL — AB (ref 12.0–15.0)
MCH: 26.6 pg (ref 26.0–34.0)
MCHC: 33.4 g/dL (ref 30.0–36.0)
MCV: 79.5 fL (ref 78.0–100.0)
PLATELETS: 251 10*3/uL (ref 150–400)
RBC: 3.65 MIL/uL — AB (ref 3.87–5.11)
RDW: 14.3 % (ref 11.5–15.5)
WBC: 3.8 10*3/uL — ABNORMAL LOW (ref 4.0–10.5)

## 2013-09-28 LAB — RAPID URINE DRUG SCREEN, HOSP PERFORMED
Amphetamines: NOT DETECTED
BARBITURATES: NOT DETECTED
Benzodiazepines: NOT DETECTED
COCAINE: NOT DETECTED
Opiates: POSITIVE — AB
TETRAHYDROCANNABINOL: NOT DETECTED

## 2013-09-28 LAB — BASIC METABOLIC PANEL
Anion gap: 11 (ref 5–15)
BUN: 40 mg/dL — ABNORMAL HIGH (ref 6–23)
CALCIUM: 8.1 mg/dL — AB (ref 8.4–10.5)
CO2: 22 mEq/L (ref 19–32)
CREATININE: 1.86 mg/dL — AB (ref 0.50–1.10)
Chloride: 105 mEq/L (ref 96–112)
GFR calc Af Amer: 38 mL/min — ABNORMAL LOW (ref >90)
GFR calc non Af Amer: 33 mL/min — ABNORMAL LOW (ref >90)
GLUCOSE: 92 mg/dL (ref 70–99)
Potassium: 3.7 mEq/L (ref 3.7–5.3)
Sodium: 138 mEq/L (ref 137–147)

## 2013-09-28 LAB — TROPONIN I
Troponin I: <0.3 ng/mL (ref <0.30)
Troponin I: <0.3 ng/mL (ref <0.30)

## 2013-09-28 LAB — D-DIMER, QUANTITATIVE: D-Dimer, Quant: 0.7 ug/mL-FEU — ABNORMAL HIGH (ref 0.00–0.48)

## 2013-09-28 MED ORDER — TECHNETIUM TC 99M DIETHYLENETRIAME-PENTAACETIC ACID: 40.0000 | Freq: Once | INTRAVENOUS | Status: AC | PRN

## 2013-09-28 MED ORDER — ONDANSETRON HCL 4 MG/2ML IJ SOLN
4.0000 mg | Freq: Four times a day (QID) | INTRAMUSCULAR | Status: DC | PRN
  Administered 2013-09-28 – 2013-09-29 (×2): 4 mg via INTRAVENOUS
  Filled 2013-09-28 (×2): qty 2

## 2013-09-28 MED ORDER — TECHNETIUM TO 99M ALBUMIN AGGREGATED
6.0000 | Freq: Once | INTRAVENOUS | Status: AC | PRN
  Administered 2013-09-28: 6 via INTRAVENOUS

## 2013-09-28 NOTE — Progress Notes (Signed)
[facility] PROGRESS NOTE  Patricia [name] PNT:[id number] DOB: 1975-11-20 DOA: 09/27/2013 PCP: Tereasa Coop, PA-C  Assessment/Plan: #1 chest pain Patient presented with chest pain with history of chronic kidney disease followed at [facility], history of hypertension, diabetes, hyperlipidemia and family history of coronary artery disease. VQ scan which was done was low probability for PE. Lower extremity Dopplers were negative. Cardiology has been consulted and recommended Myoview stress test to be done as an outpatient as cannot be done for the next 48 hours secondary to recent VQ scan done. Continue Lopressor for now. Patient likely need an event monitor per cardiology on discharge. Continue aspirin, Lipitor, lisinopril, Zaroxolyn and Demadex. Cardiology following and appreciate input and recommendations.  #2 dizziness Will check orthostatics.  #3 chronic kidney disease/glomerulonephritis Been followed at [facility]. Patient completed chemotherapy with Cytoxan 2 weeks prior to dimension. Renal function at baseline. Follow.  #4 hypertension Blood pressure stable. Continue home regimen of lisinopril, metolazone, torsemide. Continue Lopressor.  #5 prophylaxis Heparin for DVT prophylaxis.  Code Status: Full Family Communication: Updated patient and family at bedside. Disposition Plan: Home when medically stable.   Consultants:  Cardiology: Dr. Sallyanne Kuster 09/28/2013  Procedures:  CT scan 09/28/2013  Chest x-ray 09/27/2013  Lower extremity Dopplers 09/28/2013  Antibiotics: None  HPI/Subjective: Patient states some improvement in chest pain. Patient however complaining of dizziness. No shortness of breath.  Objective: Filed Vitals:   09/28/13 0549  BP: 132/76  Pulse: 56  Temp: 97.7 F (36.5 C)  Resp:     Intake/Output Summary (Last 24 hours) at 09/28/13 1644 Last data filed at 09/28/13 0449  Gross per 24 hour  Intake      0 ml  Output    800 ml  Net   -800 ml   Filed  Weights   09/27/13 1420 09/27/13 2308  Weight: 104.327 kg (230 lb) 109.816 kg (242 lb 1.6 oz)    Exam:   General:  NAD  Cardiovascular: RRR  Respiratory: CTAB  Abdomen: Soft/NT/ND/+BS  Musculoskeletal: No c/c/e  Data Reviewed: Basic Metabolic Panel:  Recent Labs Lab 09/27/13 1440 09/28/13 0533  NA 140 138  K 3.1* 3.7  CL 104 105  CO2 25 22  GLUCOSE 117* 92  BUN 42* 40*  CREATININE 1.84* 1.86*  CALCIUM 8.1* 8.1*   Liver Function Tests: No results found for this basename: AST, ALT, ALKPHOS, BILITOT, PROT, ALBUMIN,  in the last 168 hours No results found for this basename: LIPASE, AMYLASE,  in the last 168 hours No results found for this basename: AMMONIA,  in the last 168 hours CBC:  Recent Labs Lab 09/27/13 1440 09/27/13 2249 09/28/13 0533  WBC 4.3 4.2 3.8*  HGB 10.4* 10.1* 9.7*  HCT 30.8* 30.0* 29.0*  MCV 79.2 78.3 79.5  PLT 260 250 251   Cardiac Enzymes:  Recent Labs Lab 09/27/13 1913 09/28/13 0533 09/28/13 1020  TROPONINI <0.30 <0.30 <0.30   BNP (last 3 results)  Recent Labs  01/27/13 2157 06/29/13 1426 08/21/13 0739  PROBNP 262.9* 313.9* 358.8*   CBG: No results found for this basename: GLUCAP,  in the last 168 hours  No results found for this or any previous visit (from the past 240 hour(s)).   Studies: Dg Chest 2 View  09/27/2013   CLINICAL DATA:  Shortness of breath  EXAM: CHEST  2 VIEW  COMPARISON:  08/21/2013  FINDINGS: The heart size and mediastinal contours are within normal limits. Both lungs are clear. The visualized skeletal structures are unremarkable.  IMPRESSION:  No active cardiopulmonary disease.   Electronically Signed   By: Kathreen Devoid   On: 09/27/2013 16:27   Nm Pulmonary Perf And Vent  09/28/2013   CLINICAL DATA:  Chest pain, elevated D-dimer, hypertension, diabetes  EXAM: NUCLEAR MEDICINE VENTILATION - PERFUSION LUNG SCAN  TECHNIQUE: Ventilation images were obtained in multiple projections using inhaled aerosol  technetium 99 M DTPA. Perfusion images were obtained in multiple projections after intravenous injection of Tc-24m MAA.  RADIOPHARMACEUTICALS:  40 mCi Tc-47m DTPA aerosol and 6 mCi Tc-49m MAA  COMPARISON:  09/12/2013; radiographic correlation chest radiograph 09/27/2013  FINDINGS: Ventilation: Minimal diminished ventilation in LEFT upper lobe and lingula.  Perfusion: Minimal matching diminished perfusion in LEFT upper lobe. No additional segmental or subsegmental perfusion defects identified. Borderline enlargement of cardiac silhouette.  Chest radiograph:  Lungs clear.  Upper normal heart size.  IMPRESSION: Very low probability for pulmonary embolism.   Electronically Signed   By: Lavonia Dana M.D.   On: 09/28/2013 11:40    Scheduled Meds: . aspirin  325 mg Oral Daily  . atorvastatin  20 mg Oral QHS  . heparin  5,000 Units Subcutaneous 3 times per day  . lisinopril  10 mg Oral Daily  . metolazone  5 mg Oral Daily  . metoprolol tartrate  12.5 mg Oral BID  . multivitamin with minerals  1 tablet Oral QPC breakfast  . pantoprazole  40 mg Oral Daily  . sodium chloride  3 mL Intravenous Q12H  . sodium chloride  3 mL Intravenous Q12H  . torsemide  80 mg Oral Daily  . vitamin C  500 mg Oral Daily   Continuous Infusions:   Principal Problem:   Chest pain with moderate risk of acute coronary syndrome Active Problems:   Morbid obesity   HTN (hypertension)   DM (diabetes mellitus)   Hypercholesterolemia   Glomerulonephritis   CKD stage 3 followed at [facility]    Time spent: 19 mins    [facility] MD [facility] Pager [phone]. If 7PM-7AM, please contact night-coverage at [email or web address], password [facility] 09/28/2013, 4:44 PM  LOS: 1 day

## 2013-09-28 NOTE — Consult Note (Signed)
Reason for Consult:   Chest pain  Requesting Physician: [facility]  HPI: This is a 38 y.o. female with a past medical history significant for chronic renal disease, followed at [facility]. Her baseline SCr is around 2. She has HTN, DM, dyslipidemia, and a FMHX of CAD. She was seen by [facility] in 2012 and had a Myoview that was negative. She has not seen a cardiologist since than. She was recently admitted with symptomatic anemia and was transfused. She also had a knee effusion tapped. She was discharged 09/14/13. She came back to [facility] 09/27/13 with Lt sided chest pain and SOB. Her work up so far has been negative. Echo was normal. Troponin negative, VQ negative, LE dopplers negative for DVT, and EKG was WNL.            She has had Lt sided ache that radiated to her back. Its not always exertional. She also complains of palpitations and says she had a near syncopal spell with tachycardia before admission.          PMHx:  Past Medical History  Diagnosis Date  . Hypertension   . Acid reflux   . High cholesterol   . Diabetes mellitus     "took me off my RX ~ 04/2013"  . Anemia   . History of blood transfusion     "related to low counts"  . Headache(784.0)     "related to chemo; sometimes weekly" (09/12/2013)  . Chronic kidney disease (CKD), stage III (moderate)   . 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 Ankle fracture surgery Right 1994  . Abdominal hysterectomy  2010    "laparoscopic"  . Cardiac catheterization  2000's    SOCHx:  reports that she has never smoked. She has never used smokeless tobacco. She reports that she does not drink alcohol or use illicit drugs.  FAMHx: Family History  Problem Relation Age of Onset  . Hypertension Mother   . Thyroid disease Mother   . Coronary artery disease Father   . Hypertension Father   . Diabetes Father      ALLERGIES: Allergies  Allergen Reactions  . Nsaids Other (See Comments)    Cannot take due to Kidney disease    ROS: Pertinent items are noted in HPI. see admission H&P for complete ROS.   HOME MEDICATIONS: Prior to Admission medications   Medication Sig Start Date End Date Taking? Authorizing Provider  atorvastatin (LIPITOR) 20 MG tablet Take 20 mg by mouth at bedtime.   Yes Historical Provider, MD  lisinopril (PRINIVIL,ZESTRIL) 10 MG tablet Take 10 mg by mouth daily. Hold if BP <130   Yes Historical Provider, MD  LORazepam (ATIVAN) 0.5 MG tablet Take 0.5 mg by mouth every 6 (six) hours as needed for anxiety.   Yes Historical Provider, MD  metolazone (ZAROXOLYN) 5 MG tablet Take 5 mg by mouth daily.   Yes Historical Provider, MD  Multiple Vitamin (MULTIVITAMIN WITH MINERALS) TABS tablet Take 1 tablet by mouth daily.   Yes Historical Provider, MD  omeprazole (PRILOSEC) 40 MG capsule Take 40 mg by mouth daily.     Yes Historical Provider, MD  oxyCODONE-acetaminophen (PERCOCET/ROXICET) 5-325 MG per tablet Take 1-2 tablets by mouth every 4 (four) hours as needed for moderate pain. 09/14/13  Yes Theodis Blaze, MD  promethazine (PHENERGAN) 25 MG tablet Take  1 tablet (25 mg total) by mouth every 6 (six) hours as needed for nausea or vomiting. 09/14/13  Yes Theodis Blaze, MD  torsemide (DEMADEX) 20 MG tablet Take 80 mg by mouth daily.    Yes Historical Provider, MD  vitamin C (ASCORBIC ACID) 500 MG tablet Take 500 mg by mouth daily.   Yes Historical Provider, MD    HOSPITAL MEDICATIONS: I have reviewed the patient's current medications.  VITALS: Blood pressure 132/76, pulse 56, temperature 97.7 F (36.5 C), temperature source Oral, resp. rate 13, height $RemoveBe'5\' 7"'LxdQoJGCT$  (1.702 m), weight 242 lb 1.6 oz (109.816 kg), SpO2 100.00%.  PHYSICAL EXAM: General appearance: alert, cooperative, no distress and morbidly obese Neck: no carotid bruit and no JVD Lungs: clear to auscultation  bilaterally Heart: regular rate and rhythm, S1, S2 normal, no murmur, click, rub or gallop Abdomen: obese, non tender Extremities: extremities normal, atraumatic, no cyanosis or edema Pulses: 2+ and symmetric Skin: Skin color, texture, turgor normal. No rashes or lesions Neurologic: Grossly normal  LABS: Results for orders placed during the hospital encounter of 09/27/13 (from the past 24 hour(s))  TROPONIN I     Status: None   Collection Time    09/27/13  7:13 PM      Result Value Ref Range   Troponin I <0.30  <0.30 ng/mL  URINALYSIS, ROUTINE W REFLEX MICROSCOPIC     Status: Abnormal   Collection Time    09/27/13  7:26 PM      Result Value Ref Range   Color, Urine YELLOW  YELLOW   APPearance CLEAR  CLEAR   Specific Gravity, Urine 1.018  1.005 - 1.030   pH 7.0  5.0 - 8.0   Glucose, UA 250 (*) NEGATIVE mg/dL   Hgb urine dipstick SMALL (*) NEGATIVE   Bilirubin Urine NEGATIVE  NEGATIVE   Ketones, ur NEGATIVE  NEGATIVE mg/dL   Protein, ur >300 (*) NEGATIVE mg/dL   Urobilinogen, UA 0.2  0.0 - 1.0 mg/dL   Nitrite NEGATIVE  NEGATIVE   Leukocytes, UA NEGATIVE  NEGATIVE  URINE MICROSCOPIC-ADD ON     Status: Abnormal   Collection Time    09/27/13  7:26 PM      Result Value Ref Range   Squamous Epithelial / LPF RARE  RARE   RBC / HPF 7-10  <3 RBC/hpf   Bacteria, UA FEW (*) RARE   Casts HYALINE CASTS (*) NEGATIVE   Urine-Other MUCOUS PRESENT    CBC     Status: Abnormal   Collection Time    09/27/13 10:49 PM      Result Value Ref Range   WBC 4.2  4.0 - 10.5 K/uL   RBC 3.83 (*) 3.87 - 5.11 MIL/uL   Hemoglobin 10.1 (*) 12.0 - 15.0 g/dL   HCT 30.0 (*) 36.0 - 46.0 %   MCV 78.3  78.0 - 100.0 fL   MCH 26.4  26.0 - 34.0 pg   MCHC 33.7  30.0 - 36.0 g/dL   RDW 14.0  11.5 - 15.5 %   Platelets 250  150 - 400 K/uL  PROTIME-INR     Status: None   Collection Time    09/27/13 10:49 PM      Result Value Ref Range   Prothrombin Time 12.6  11.6 - 15.2 seconds   INR 0.94  0.00 - 1.49   D-DIMER, QUANTITATIVE     Status: Abnormal   Collection Time    09/28/13  3:10 AM  Result Value Ref Range   D-Dimer, Quant 0.70 (*) 0.00 - 0.48 ug/mL-FEU  TROPONIN I     Status: None   Collection Time    09/28/13  5:33 AM      Result Value Ref Range   Troponin I <0.30  <0.30 ng/mL  CBC     Status: Abnormal   Collection Time    09/28/13  5:33 AM      Result Value Ref Range   WBC 3.8 (*) 4.0 - 10.5 K/uL   RBC 3.65 (*) 3.87 - 5.11 MIL/uL   Hemoglobin 9.7 (*) 12.0 - 15.0 g/dL   HCT 29.0 (*) 36.0 - 46.0 %   MCV 79.5  78.0 - 100.0 fL   MCH 26.6  26.0 - 34.0 pg   MCHC 33.4  30.0 - 36.0 g/dL   RDW 14.3  11.5 - 15.5 %   Platelets 251  150 - 400 K/uL  BASIC METABOLIC PANEL     Status: Abnormal   Collection Time    09/28/13  5:33 AM      Result Value Ref Range   Sodium 138  137 - 147 mEq/L   Potassium 3.7  3.7 - 5.3 mEq/L   Chloride 105  96 - 112 mEq/L   CO2 22  19 - 32 mEq/L   Glucose, Bld 92  70 - 99 mg/dL   BUN 40 (*) 6 - 23 mg/dL   Creatinine, Ser 1.86 (*) 0.50 - 1.10 mg/dL   Calcium 8.1 (*) 8.4 - 10.5 mg/dL   GFR calc non Af Amer 33 (*) >90 mL/min   GFR calc Af Amer 38 (*) >90 mL/min   Anion gap 11  5 - 15  URINE RAPID DRUG SCREEN (HOSP PERFORMED)     Status: Abnormal   Collection Time    09/28/13  6:02 AM      Result Value Ref Range   Opiates POSITIVE (*) NONE DETECTED   Cocaine NONE DETECTED  NONE DETECTED   Benzodiazepines NONE DETECTED  NONE DETECTED   Amphetamines NONE DETECTED  NONE DETECTED   Tetrahydrocannabinol NONE DETECTED  NONE DETECTED   Barbiturates NONE DETECTED  NONE DETECTED  TROPONIN I     Status: None   Collection Time    09/28/13 10:20 AM      Result Value Ref Range   Troponin I <0.30  <0.30 ng/mL    EKG:   IMAGING: Dg Chest 2 View  09/27/2013   CLINICAL DATA:  Shortness of breath  EXAM: CHEST  2 VIEW  COMPARISON:  08/21/2013  FINDINGS: The heart size and mediastinal contours are within normal limits. Both lungs are clear. The visualized  skeletal structures are unremarkable.  IMPRESSION: No active cardiopulmonary disease.   Electronically Signed   By: Kathreen Devoid   On: 09/27/2013 16:27   Nm Pulmonary Perf And Vent  09/28/2013   CLINICAL DATA:  Chest pain, elevated D-dimer, hypertension, diabetes  EXAM: NUCLEAR MEDICINE VENTILATION - PERFUSION LUNG SCAN  TECHNIQUE: Ventilation images were obtained in multiple projections using inhaled aerosol technetium 99 M DTPA. Perfusion images were obtained in multiple projections after intravenous injection of Tc-90m MAA.  RADIOPHARMACEUTICALS:  40 mCi Tc-15m DTPA aerosol and 6 mCi Tc-75m MAA  COMPARISON:  09/12/2013; radiographic correlation chest radiograph 09/27/2013  FINDINGS: Ventilation: Minimal diminished ventilation in LEFT upper lobe and lingula.  Perfusion: Minimal matching diminished perfusion in LEFT upper lobe. No additional segmental or subsegmental perfusion defects identified. Borderline enlargement of cardiac silhouette.  Chest radiograph:  Lungs clear.  Upper normal heart size.  IMPRESSION: Very low probability for pulmonary embolism.   Electronically Signed   By: Lavonia Dana M.D.   On: 09/28/2013 11:40    IMPRESSION: Principal Problem:   Chest pain with moderate risk of acute coronary syndrome Active Problems:   DM (diabetes mellitus)   Glomerulonephritis   CKD stage 3 followed at [facility]   Morbid obesity   HTN (hypertension)   Hypercholesterolemia   RECOMMENDATION:   MD to see- Myoview can't be done till 9/26 secondary to VQ scan today. She feels "weak" today. Still having some chest discomfort. She could be discharged tomorrow and have a Myoview and 2 week monitor as an OP if she is better in the am. (I can see her in f/u). Lopressor is new- would continue this at discharge.   Time Spent Directly with Patient: 40 minutes  Erlene Quan [phone] beeper 09/28/2013, 2:59 PM   I have seen and examined the patient along with Erlene Quan, PA.  I have reviewed the chart,  notes and new data.  I agree with PA's note.  Key new complaints: improving slowly since admission Key examination changes: no signs of HF and no detectable arrhythmia so far Key new findings / data: low risk enzymes and ECG  PLAN: Symptoms are atypical, pain is neither pleuritic, nor anginal, but she has substantial coronary risk factors. Unfortunately, V/Q scan residual radioactivity prevents a myocardial perfusion scan for another 48H. She feels too weak to go home today. Plan Lexiscan Myoview and 30 day event monitor at discharge.  Sanda Klein, MD, [place] [phone] 09/28/2013, 4:01 PM

## 2013-09-28 NOTE — Progress Notes (Signed)
UR completed 

## 2013-09-28 NOTE — ED Provider Notes (Signed)
Medical screening examination/treatment/procedure(s) were performed by non-physician practitioner and as supervising physician I was immediately available for consultation/collaboration.   EKG Interpretation   Date/Time:  Wednesday September 27 2013 14:18:35 EDT Ventricular Rate:  72 PR Interval:  158 QRS Duration: 90 QT Interval:  400 QTC Calculation: 438 R Axis:   39 Text Interpretation:  Normal sinus rhythm Cannot rule out Anterior infarct  , age undetermined Abnormal ECG No significant change since last tracing  Confirmed by YAO  MD, DAVID ([id number]) on 09/27/2013 5:35:29 PM        Wandra Arthurs, MD 09/28/13 1044

## 2013-09-28 NOTE — Progress Notes (Signed)
VASCULAR LAB PRELIMINARY  PRELIMINARY  PRELIMINARY  PRELIMINARY  Bilateral lower extremity venous duplex  completed.    Preliminary report:  Bilateral:  No evidence of DVT, superficial thrombosis, or Baker's Cyst.    Phiona Ramnauth, RVT 09/28/2013, 2:31 PM

## 2013-09-29 DIAGNOSIS — I951 Orthostatic hypotension: Secondary | ICD-10-CM

## 2013-09-29 DIAGNOSIS — I471 Supraventricular tachycardia: Secondary | ICD-10-CM | POA: Clinically undetermined

## 2013-09-29 LAB — CBC
HCT: 29.5 % — ABNORMAL LOW (ref 36.0–46.0)
HCT: 22.3 % — ABNORMAL LOW (ref 36.0–46.0)
HEMOGLOBIN: 7.5 g/dL — AB (ref 12.0–15.0)
Hemoglobin: 9.9 g/dL — ABNORMAL LOW (ref 12.0–15.0)
MCH: 26.5 pg (ref 26.0–34.0)
MCH: 26.6 pg (ref 26.0–34.0)
MCHC: 33.6 g/dL (ref 30.0–36.0)
MCHC: 33.6 g/dL (ref 30.0–36.0)
MCV: 78.9 fL (ref 78.0–100.0)
MCV: 79.1 fL (ref 78.0–100.0)
Platelets: 259 10*3/uL (ref 150–400)
Platelets: 298 10*3/uL (ref 150–400)
RBC: 3.74 MIL/uL — ABNORMAL LOW (ref 3.87–5.11)
RBC: 2.82 MIL/uL — ABNORMAL LOW (ref 3.87–5.11)
RDW: 13.9 % (ref 11.5–15.5)
RDW: 13.8 % (ref 11.5–15.5)
WBC: 3.7 10*3/uL — ABNORMAL LOW (ref 4.0–10.5)
WBC: 5.1 10*3/uL (ref 4.0–10.5)

## 2013-09-29 LAB — BASIC METABOLIC PANEL
ANION GAP: 11 (ref 5–15)
Anion gap: 10 (ref 5–15)
BUN: 39 mg/dL — ABNORMAL HIGH (ref 6–23)
BUN: 43 mg/dL — ABNORMAL HIGH (ref 6–23)
CALCIUM: 8.4 mg/dL (ref 8.4–10.5)
CHLORIDE: 104 meq/L (ref 96–112)
CO2: 26 mEq/L (ref 19–32)
CO2: 25 mEq/L (ref 19–32)
CREATININE: 2.42 mg/dL — AB (ref 0.50–1.10)
Calcium: 8.4 mg/dL (ref 8.4–10.5)
Chloride: 104 mEq/L (ref 96–112)
Creatinine, Ser: 2.45 mg/dL — ABNORMAL HIGH (ref 0.50–1.10)
GFR calc Af Amer: 28 mL/min — ABNORMAL LOW (ref >90)
GFR calc Af Amer: 27 mL/min — ABNORMAL LOW (ref >90)
GFR calc non Af Amer: 24 mL/min — ABNORMAL LOW (ref >90)
GFR, EST NON AFRICAN AMERICAN: 24 mL/min — AB (ref >90)
GLUCOSE: 100 mg/dL — AB (ref 70–99)
Glucose, Bld: 118 mg/dL — ABNORMAL HIGH (ref 70–99)
Potassium: 3.3 mEq/L — ABNORMAL LOW (ref 3.7–5.3)
Potassium: 3.4 mEq/L — ABNORMAL LOW (ref 3.7–5.3)
SODIUM: 140 meq/L (ref 137–147)
Sodium: 140 mEq/L (ref 137–147)

## 2013-09-29 LAB — TROPONIN I: Troponin I: <0.3 ng/mL (ref <0.30)

## 2013-09-29 LAB — MAGNESIUM: Magnesium: 1.8 mg/dL (ref 1.5–2.5)

## 2013-09-29 MED ORDER — NITROGLYCERIN 0.4 MG SL SUBL
0.4000 mg | SUBLINGUAL_TABLET | SUBLINGUAL | Status: DC | PRN
  Administered 2013-09-29: 0.4 mg via SUBLINGUAL
  Filled 2013-09-29: qty 1

## 2013-09-29 MED ORDER — NITROGLYCERIN 0.4 MG SL SUBL
SUBLINGUAL_TABLET | SUBLINGUAL | Status: AC
  Administered 2013-09-29: 0.3 mg via SUBLINGUAL
  Filled 2013-09-29: qty 1

## 2013-09-29 MED ORDER — NITROGLYCERIN 0.3 MG SL SUBL
0.3000 mg | SUBLINGUAL_TABLET | SUBLINGUAL | Status: DC | PRN
  Administered 2013-09-29 (×2): 0.3 mg via SUBLINGUAL

## 2013-09-29 MED ORDER — POTASSIUM CHLORIDE CRYS ER 20 MEQ PO TBCR
40.0000 meq | EXTENDED_RELEASE_TABLET | Freq: Once | ORAL | Status: AC
  Administered 2013-09-29: 40 meq via ORAL
  Filled 2013-09-29: qty 2

## 2013-09-29 MED ORDER — NITROGLYCERIN 2 % TD OINT
0.5000 [in_us] | TOPICAL_OINTMENT | Freq: Four times a day (QID) | TRANSDERMAL | Status: DC
  Administered 2013-09-29 – 2013-09-30 (×3): 0.5 [in_us] via TOPICAL
  Filled 2013-09-29: qty 30

## 2013-09-29 MED ORDER — METOPROLOL TARTRATE 25 MG PO TABS
25.0000 mg | ORAL_TABLET | Freq: Two times a day (BID) | ORAL | Status: DC
Start: 2013-09-29 — End: 2013-10-03
  Administered 2013-09-29 – 2013-10-03 (×8): 25 mg via ORAL
  Filled 2013-09-29 (×10): qty 1

## 2013-09-29 NOTE — Progress Notes (Signed)
Was called per nursing that patient c/o left substernal CP radiating to jaw and LUE. Went and examined the patient in the room patient with complaints of left substernal chest and radiating to the jaw and the left upper extremity. Physical exam General: NAD Respiratory lungs clear to auscultation bilaterally Cardiovascular regular rate rhythm no murmurs rubs or gallops Abdomen: Soft, nontender nondistended, positive bowel sounds. Extremities: No clubbing cyanosis or edema   Assessment and plan Chest pain Patient complaining of chest pain which had presented with. Patient states chest pain now radiating to her jaw or left upper extremity. Ordered an EKG which shows a normal sinus rhythm and slight T wave inversion in leads 3. Will cycle cardiac enzymes every 6 hours x3. Check a be met. Check a CBC. Nitroglycerin sublingual. Patient on a beta blocker. Morphine as needed. Continue oxygen. Nitro paste. Continue aspirin, Lipitor. Patient on prophylactic dose heparin. Spoke with cardiology PA on-call. Follow.

## 2013-09-29 NOTE — Progress Notes (Signed)
[name] Date of Encounter: 09/29/2013     Principal Problem:   Chest pain with moderate risk of acute coronary syndrome Active Problems:   Morbid obesity   HTN (hypertension)   DM (diabetes mellitus)   Hypercholesterolemia   Glomerulonephritis   CKD stage 3 followed at [facility]  Denies any CP or SOB. States she has been on Metolazone and demadex for awhile. They were prescribed by her nephrologist at [facility] . aspirin  325 mg Oral Daily  . atorvastatin  20 mg Oral QHS  . heparin  5,000 Units Subcutaneous 3 times per day  . lisinopril  10 mg Oral Daily  . metoprolol tartrate  12.5 mg Oral BID  . multivitamin with minerals  1 tablet Oral QPC breakfast  . pantoprazole  40 mg Oral Daily  . sodium chloride  3 mL Intravenous Q12H  . sodium chloride  3 mL Intravenous Q12H  . vitamin C  500 mg Oral Daily    OBJECTIVE  Filed Vitals:   09/27/13 2311 09/28/13 0549 09/28/13 1955 09/29/13 0629  BP: 130/66 132/76 138/62 110/50  Pulse: 57 56 58 61  Temp: 97.4 F (36.3 C) 97.7 F (36.5 C) 98.3 F (36.8 C) 98.5 F (36.9 C)  TempSrc: Oral Oral Oral Oral  Resp:   18 18  Height:      Weight:    241 lb 5 oz (109.458 kg)  SpO2: 100% 100% 100% 100%    Intake/Output Summary (Last 24 hours) at 09/29/13 1223 Last data filed at 09/29/13 0841  Gross per 24 hour  Intake    240 ml  Output   1000 ml  Net   -760 ml   Filed Weights   09/27/13 1420 09/27/13 2308 09/29/13 0629  Weight: 230 lb (104.327 kg) 242 lb 1.6 oz (109.816 kg) 241 lb 5 oz (109.458 kg)    PHYSICAL EXAM  General: Pleasant, NAD. Neuro: Alert and oriented X 3. Moves all extremities spontaneously. Psych: Normal affect. HEENT:  Normal  Neck: Supple without bruits or JVD. Lungs:  Resp regular and unlabored, CTA. No significant rale Heart: RRR no s3, s4, or murmurs. Abdomen: Soft, non-tender, non-distended, BS + x 4.  Extremities: No clubbing, cyanosis. DP/PT/Radials 2+  and equal bilaterally. 0-1+ edema  Accessory Clinical Findings  CBC  Recent Labs  09/28/13 0533 09/29/13 0850  WBC 3.8* 3.7*  HGB 9.7* 9.9*  HCT 29.0* 29.5*  MCV 79.5 78.9  PLT 251 588   Basic Metabolic Panel  Recent Labs  09/28/13 0533 09/29/13 0850  NA 138 140  K 3.7 3.3*  CL 105 104  CO2 22 26  GLUCOSE 92 100*  BUN 40* 39*  CREATININE 1.86* 2.42*  CALCIUM 8.1* 8.4   Cardiac Enzymes  Recent Labs  09/27/13 1913 09/28/13 0533 09/28/13 1020  TROPONINI <0.30 <0.30 <0.30   BNP No components found with this basename: POCBNP,  D-Dimer  Recent Labs  09/28/13 0310  DDIMER 0.70*    TELE NSR with HR 50-60s, occasional tachycardia, no significant ventricular ectopy    ECG  NSR with abnormal R wave progression in anterior lead  Echocardiogram 01/29/2013  LV EF: 60% - 65%  ------------------------------------------------------------ Indications: Chest pain 786.51.  ------------------------------------------------------------ History: PMH: Syncope and dyspnea. Risk factors: Hypertension. Diabetes mellitus. Morbidly obese. Dyslipidemia.  ------------------------------------------------------------ Study Conclusions  - Study data: Technically adequate study. - Left ventricle: The cavity size was normal. Wall thickness was normal. Systolic function  was normal. The estimated ejection fraction was in the range of 60% to 65%. Wall motion was normal; there were no regional wall motion abnormalities. Left ventricular diastolic function parameters were normal. - Aortic valve: Valve area: 3.17cm^2(VTI). Valve area: 2.64cm^2 (Vmax). - Left atrium: The atrium was mildly dilated.      Radiology/Studies  Dg Chest 2 View  09/27/2013   CLINICAL DATA:  Shortness of breath  EXAM: CHEST  2 VIEW  COMPARISON:  08/21/2013  FINDINGS: The heart size and mediastinal contours are within normal limits. Both lungs are clear. The visualized skeletal structures are  unremarkable.  IMPRESSION: No active cardiopulmonary disease.   Electronically Signed   By: Kathreen Devoid   On: 09/27/2013 16:27   Dg Knee 1-2 Views Left  09/12/2013   CLINICAL DATA:  Knee swelling and pain  EXAM: LEFT KNEE - 1-2 VIEW  COMPARISON:  None.  FINDINGS: There is no evidence of fracture, dislocation. There is a suprapatellar effusion. Soft tissues are unremarkable.  IMPRESSION: No acute fracture or dislocation.  Suprapatellar effusion.   Electronically Signed   By: Abelardo Diesel M.D.   On: 09/12/2013 18:58   Nm Pulmonary Perf And Vent  09/28/2013   CLINICAL DATA:  Chest pain, elevated D-dimer, hypertension, diabetes  EXAM: NUCLEAR MEDICINE VENTILATION - PERFUSION LUNG SCAN  TECHNIQUE: Ventilation images were obtained in multiple projections using inhaled aerosol technetium 99 M DTPA. Perfusion images were obtained in multiple projections after intravenous injection of Tc-6m MAA.  RADIOPHARMACEUTICALS:  40 mCi Tc-97m DTPA aerosol and 6 mCi Tc-69m MAA  COMPARISON:  09/12/2013; radiographic correlation chest radiograph 09/27/2013  FINDINGS: Ventilation: Minimal diminished ventilation in LEFT upper lobe and lingula.  Perfusion: Minimal matching diminished perfusion in LEFT upper lobe. No additional segmental or subsegmental perfusion defects identified. Borderline enlargement of cardiac silhouette.  Chest radiograph:  Lungs clear.  Upper normal heart size.  IMPRESSION: Very low probability for pulmonary embolism.   Electronically Signed   By: Lavonia Dana M.D.   On: 09/28/2013 11:40   Nm Pulmonary Perf And Vent  09/12/2013   CLINICAL DATA:  Shortness of breath.  Lower extremity swelling.  EXAM: NUCLEAR MEDICINE VENTILATION - PERFUSION LUNG SCAN  TECHNIQUE: Ventilation images were obtained in multiple projections using inhaled aerosol technetium 99 M DTPA. Perfusion images were obtained in multiple projections after intravenous injection of Tc-65m MAA.  RADIOPHARMACEUTICALS:  6 mCi Tc-14m DTPA aerosol  and 40 mCi Tc-23m MAA  COMPARISON:  Chest x-ray 17 2015.  V/Q scan 01/28/2013.  FINDINGS: Ventilation: Patchy tiny bilateral ventilation defects.  Perfusion: No wedge shaped peripheral perfusion defects to suggest acute pulmonary embolism.  IMPRESSION: Low probability pulmonary embolus.   Electronically Signed   By: Marcello Moores  Register   On: 09/12/2013 18:47    ASSESSMENT AND PLAN  1. Atypical chest pain w/ occasional palpitation  - low risk V/Q scan, LE venous doppler neg for DVT. previously negative myoview 2012  - due to recent V/Q scan, unable to do stress test. Dr. Sallyanne Kuster, plan for outpatient myoview and 30 day event monitor on discharge  - continue ASA, BB. Cr increased from 1.86 to 2.42, consider hold ACEI, and change to amlodipine $RemoveBefor'5mg'RaNKniushgwK$   - hold demadex and metalazone until at least tomorrow, follow up with patient's Nephrologist. Unclear what's causing the jump  - originally expected discharge today, however discussed with IM team who will keep patient 1 more day due to worsening renal function, will see if the Lexiscan stress test can be  done as inpatient tomorrow AM  2. Dizziness  - negative for orthostatic hypotension  - may be related to anemia vs arrythmia  3. Acute on Chronic kidney disease/Glomerulonephritis  - follow by [facility]  4. HTN 5. DM 6. Dyslipidemia 7. H/o symptomatic anemia s/p transfusion  [place] Brass [place] Ku Pager: [phone] Patient seen and examined and history reviewed. Agree with above findings and plan. Patient still complaining of left precordial chest pain radiating to her shoulder blade and some to her jaw. Also noted to have short runs of SVT on monitor that are symptomatic. Renal function is worse today. Diuretics and ACEi on hold. Will reassess BMET in am. Will increased metoprolol for PAT. Plan to do Myoview study in am.  Peter Martinique, [place] 09/29/2013 1:12 PM

## 2013-09-29 NOTE — Progress Notes (Signed)
[facility] PROGRESS NOTE  Patricia [name] ASN:[id number] DOB: 1975/12/17 DOA: 09/27/2013 PCP: Tereasa Coop, PA-C  Assessment/Plan: #1 chest pain Patient presented with chest pain with history of chronic kidney disease followed at [facility], history of hypertension, diabetes, hyperlipidemia and family history of coronary artery disease. VQ scan which was done was low probability for PE. Lower extremity Dopplers were negative. Patient still with complaints of chest pain. Patient also noted to have one of SVT. Renal function worse today and a such diuretics and ACE inhibitors on hold. Repeat blood work in the morning. Lopressor dose has been adjusted per cardiology. Patient for Myoview study tomorrow. Cardiology following and appreciate input and recommendations.  #2 dizziness/orthostatic hypotension Orthostatics which were checked yesterday was consistent with orthostasis as supine blood pressure 142/67 to a standing blood pressure of 124/69. Orthostasis has improved today. Will discontinue diuretics of metolazone and torsemide. Follow.   #3 acute on chronic chronic kidney disease/glomerulonephritis Been followed at [facility]. Patient completed chemotherapy with Cytoxan 2 weeks prior to dimension. Patient with worsening renal function. Likely secondary to prerenal azotemia secondary to diuretics and ACE inhibitor. Will hold patient's metolazone and torsemide today. Hold lisinopril. Follow renal function.   #4 PAT Lopressor dose has been adjusted per cardiology.  #5 hypertension Blood pressure stable. Continue Lopressor.  #6 prophylaxis Heparin for DVT prophylaxis.  Code Status: Full Family Communication: Updated patient at bedside. Disposition Plan: Home when medically stable.   Consultants:  Cardiology: Dr. Sallyanne Kuster 09/28/2013  Procedures:  CT scan 09/28/2013  Chest x-ray 09/27/2013  Lower extremity Dopplers 09/28/2013  V/q scan  09/28/2013  Antibiotics: None  HPI/Subjective: Patient with c/o chest pain. Patient some improvement with dizziness, however has not ambulated yet today. No shortness of breath. Patient noted to have runs of SVT on telemetry.  Objective: Filed Vitals:   09/29/13 0629  BP: 110/50  Pulse: 61  Temp: 98.5 F (36.9 C)  Resp: 18    Intake/Output Summary (Last 24 hours) at 09/29/13 1244 Last data filed at 09/29/13 0841  Gross per 24 hour  Intake    240 ml  Output   1000 ml  Net   -760 ml   Filed Weights   09/27/13 1420 09/27/13 2308 09/29/13 0629  Weight: 104.327 kg (230 lb) 109.816 kg (242 lb 1.6 oz) 109.458 kg (241 lb 5 oz)    Exam:   General:  NAD  Cardiovascular: RRR  Respiratory: CTAB  Abdomen: Soft/NT/ND/+BS  Musculoskeletal: No c/c/e  Data Reviewed: Basic Metabolic Panel:  Recent Labs Lab 09/27/13 1440 09/28/13 0533 09/29/13 0850  NA 140 138 140  K 3.1* 3.7 3.3*  CL 104 105 104  CO2 $Re'25 22 26  'vhj$ GLUCOSE 117* 92 100*  BUN 42* 40* 39*  CREATININE 1.84* 1.86* 2.42*  CALCIUM 8.1* 8.1* 8.4   Liver Function Tests: No results found for this basename: AST, ALT, ALKPHOS, BILITOT, PROT, ALBUMIN,  in the last 168 hours No results found for this basename: LIPASE, AMYLASE,  in the last 168 hours No results found for this basename: AMMONIA,  in the last 168 hours CBC:  Recent Labs Lab 09/27/13 1440 09/27/13 2249 09/28/13 0533 09/29/13 0850  WBC 4.3 4.2 3.8* 3.7*  HGB 10.4* 10.1* 9.7* 9.9*  HCT 30.8* 30.0* 29.0* 29.5*  MCV 79.2 78.3 79.5 78.9  PLT 260 250 251 259   Cardiac Enzymes:  Recent Labs Lab 09/27/13 1913 09/28/13 0533 09/28/13 1020  TROPONINI <0.30 <0.30 <0.30   BNP (last 3 results)  Recent Labs  01/27/13 2157 06/29/13 1426 08/21/13 0739  PROBNP 262.9* 313.9* 358.8*   CBG: No results found for this basename: GLUCAP,  in the last 168 hours  No results found for this or any previous visit (from the past 240 hour(s)).    Studies: Dg Chest 2 View  09/27/2013   CLINICAL DATA:  Shortness of breath  EXAM: CHEST  2 VIEW  COMPARISON:  08/21/2013  FINDINGS: The heart size and mediastinal contours are within normal limits. Both lungs are clear. The visualized skeletal structures are unremarkable.  IMPRESSION: No active cardiopulmonary disease.   Electronically Signed   By: Kathreen Devoid   On: 09/27/2013 16:27   Nm Pulmonary Perf And Vent  09/28/2013   CLINICAL DATA:  Chest pain, elevated D-dimer, hypertension, diabetes  EXAM: NUCLEAR MEDICINE VENTILATION - PERFUSION LUNG SCAN  TECHNIQUE: Ventilation images were obtained in multiple projections using inhaled aerosol technetium 99 M DTPA. Perfusion images were obtained in multiple projections after intravenous injection of Tc-60m MAA.  RADIOPHARMACEUTICALS:  40 mCi Tc-30m DTPA aerosol and 6 mCi Tc-65m MAA  COMPARISON:  09/12/2013; radiographic correlation chest radiograph 09/27/2013  FINDINGS: Ventilation: Minimal diminished ventilation in LEFT upper lobe and lingula.  Perfusion: Minimal matching diminished perfusion in LEFT upper lobe. No additional segmental or subsegmental perfusion defects identified. Borderline enlargement of cardiac silhouette.  Chest radiograph:  Lungs clear.  Upper normal heart size.  IMPRESSION: Very low probability for pulmonary embolism.   Electronically Signed   By: Lavonia Dana M.D.   On: 09/28/2013 11:40    Scheduled Meds: . aspirin  325 mg Oral Daily  . atorvastatin  20 mg Oral QHS  . heparin  5,000 Units Subcutaneous 3 times per day  . lisinopril  10 mg Oral Daily  . metoprolol tartrate  12.5 mg Oral BID  . multivitamin with minerals  1 tablet Oral QPC breakfast  . pantoprazole  40 mg Oral Daily  . potassium chloride  40 mEq Oral Once  . sodium chloride  3 mL Intravenous Q12H  . sodium chloride  3 mL Intravenous Q12H  . vitamin C  500 mg Oral Daily   Continuous Infusions:   Principal Problem:   Chest pain with moderate risk of acute  coronary syndrome Active Problems:   Morbid obesity   HTN (hypertension)   DM (diabetes mellitus)   Hypercholesterolemia   Glomerulonephritis   CKD stage 3 followed at [facility]    Time spent: 55 mins    [facility] MD [facility] Pager [phone]. If 7PM-7AM, please contact night-coverage at [email or web address], password [facility] 09/29/2013, 12:44 PM  LOS: 2 days

## 2013-09-30 ENCOUNTER — Observation Stay (HOSPITAL_COMMUNITY): Payer: Medicaid Other

## 2013-09-30 DIAGNOSIS — I209 Angina pectoris, unspecified: Secondary | ICD-10-CM | POA: Diagnosis present

## 2013-09-30 DIAGNOSIS — Z886 Allergy status to analgesic agent status: Secondary | ICD-10-CM | POA: Diagnosis not present

## 2013-09-30 DIAGNOSIS — E119 Type 2 diabetes mellitus without complications: Secondary | ICD-10-CM | POA: Diagnosis present

## 2013-09-30 DIAGNOSIS — T502X5A Adverse effect of carbonic-anhydrase inhibitors, benzothiadiazides and other diuretics, initial encounter: Secondary | ICD-10-CM | POA: Diagnosis present

## 2013-09-30 DIAGNOSIS — Z6838 Body mass index (BMI) 38.0-38.9, adult: Secondary | ICD-10-CM | POA: Diagnosis not present

## 2013-09-30 DIAGNOSIS — Z8249 Family history of ischemic heart disease and other diseases of the circulatory system: Secondary | ICD-10-CM | POA: Diagnosis not present

## 2013-09-30 DIAGNOSIS — I951 Orthostatic hypotension: Secondary | ICD-10-CM | POA: Diagnosis present

## 2013-09-30 DIAGNOSIS — N183 Chronic kidney disease, stage 3 unspecified: Secondary | ICD-10-CM | POA: Diagnosis present

## 2013-09-30 DIAGNOSIS — N179 Acute kidney failure, unspecified: Secondary | ICD-10-CM | POA: Diagnosis present

## 2013-09-30 DIAGNOSIS — N049 Nephrotic syndrome with unspecified morphologic changes: Secondary | ICD-10-CM | POA: Diagnosis present

## 2013-09-30 DIAGNOSIS — I129 Hypertensive chronic kidney disease with stage 1 through stage 4 chronic kidney disease, or unspecified chronic kidney disease: Secondary | ICD-10-CM | POA: Diagnosis present

## 2013-09-30 DIAGNOSIS — I471 Supraventricular tachycardia: Secondary | ICD-10-CM | POA: Diagnosis present

## 2013-09-30 DIAGNOSIS — Z833 Family history of diabetes mellitus: Secondary | ICD-10-CM | POA: Diagnosis not present

## 2013-09-30 DIAGNOSIS — D649 Anemia, unspecified: Secondary | ICD-10-CM | POA: Diagnosis present

## 2013-09-30 DIAGNOSIS — E785 Hyperlipidemia, unspecified: Secondary | ICD-10-CM | POA: Diagnosis present

## 2013-09-30 DIAGNOSIS — R079 Chest pain, unspecified: Secondary | ICD-10-CM | POA: Diagnosis present

## 2013-09-30 LAB — BASIC METABOLIC PANEL
Anion gap: 10 (ref 5–15)
BUN: 41 mg/dL — AB (ref 6–23)
CHLORIDE: 103 meq/L (ref 96–112)
CO2: 27 mEq/L (ref 19–32)
Calcium: 8.2 mg/dL — ABNORMAL LOW (ref 8.4–10.5)
Creatinine, Ser: 2.63 mg/dL — ABNORMAL HIGH (ref 0.50–1.10)
GFR, EST AFRICAN AMERICAN: 25 mL/min — AB (ref >90)
GFR, EST NON AFRICAN AMERICAN: 22 mL/min — AB (ref >90)
Glucose, Bld: 104 mg/dL — ABNORMAL HIGH (ref 70–99)
Potassium: 3.5 mEq/L — ABNORMAL LOW (ref 3.7–5.3)
Sodium: 140 mEq/L (ref 137–147)

## 2013-09-30 LAB — CBC
HCT: 27.7 % — ABNORMAL LOW (ref 36.0–46.0)
HEMOGLOBIN: 9.3 g/dL — AB (ref 12.0–15.0)
MCH: 26.4 pg (ref 26.0–34.0)
MCHC: 33.6 g/dL (ref 30.0–36.0)
MCV: 78.7 fL (ref 78.0–100.0)
Platelets: 237 10*3/uL (ref 150–400)
RBC: 3.52 MIL/uL — ABNORMAL LOW (ref 3.87–5.11)
RDW: 13.9 % (ref 11.5–15.5)
WBC: 3.5 10*3/uL — ABNORMAL LOW (ref 4.0–10.5)

## 2013-09-30 LAB — URINALYSIS, ROUTINE W REFLEX MICROSCOPIC
BILIRUBIN URINE: NEGATIVE
Glucose, UA: 100 mg/dL — AB
Ketones, ur: NEGATIVE mg/dL
Leukocytes, UA: NEGATIVE
Nitrite: NEGATIVE
Specific Gravity, Urine: 1.011 (ref 1.005–1.030)
Urobilinogen, UA: 0.2 mg/dL (ref 0.0–1.0)
pH: 7 (ref 5.0–8.0)

## 2013-09-30 LAB — URINE MICROSCOPIC-ADD ON

## 2013-09-30 LAB — TROPONIN I

## 2013-09-30 LAB — SODIUM, URINE, RANDOM: SODIUM UR: 60 meq/L

## 2013-09-30 LAB — CREATININE, URINE, RANDOM: CREATININE, URINE: 63.21 mg/dL

## 2013-09-30 MED ORDER — CAMPHOR-MENTHOL 0.5-0.5 % EX LOTN
1.0000 "application " | TOPICAL_LOTION | Freq: Three times a day (TID) | CUTANEOUS | Status: DC | PRN
  Filled 2013-09-30: qty 222

## 2013-09-30 MED ORDER — DOCUSATE SODIUM 283 MG RE ENEM
1.0000 | ENEMA | RECTAL | Status: DC | PRN
  Filled 2013-09-30: qty 1

## 2013-09-30 MED ORDER — ACETAMINOPHEN 325 MG PO TABS
650.0000 mg | ORAL_TABLET | Freq: Four times a day (QID) | ORAL | Status: DC | PRN
  Administered 2013-10-01 – 2013-10-03 (×2): 650 mg via ORAL
  Filled 2013-09-30 (×2): qty 2

## 2013-09-30 MED ORDER — NEPRO/CARBSTEADY PO LIQD
237.0000 mL | Freq: Three times a day (TID) | ORAL | Status: DC | PRN
  Filled 2013-09-30: qty 237

## 2013-09-30 MED ORDER — ACETAMINOPHEN 650 MG RE SUPP: 650.0000 mg | Freq: Four times a day (QID) | RECTAL | Status: DC | PRN

## 2013-09-30 MED ORDER — HYDROXYZINE HCL 25 MG PO TABS: 25.0000 mg | ORAL_TABLET | Freq: Three times a day (TID) | ORAL | Status: DC | PRN

## 2013-09-30 MED ORDER — TECHNETIUM TC 99M SESTAMIBI - CARDIOLITE
10.0000 | Freq: Once | INTRAVENOUS | Status: AC | PRN
  Administered 2013-09-30: 08:00:00 10 via INTRAVENOUS

## 2013-09-30 MED ORDER — REGADENOSON 0.4 MG/5ML IV SOLN
INTRAVENOUS | Status: AC
  Filled 2013-09-30: qty 5

## 2013-09-30 MED ORDER — ONDANSETRON HCL 4 MG PO TABS: 4.0000 mg | ORAL_TABLET | Freq: Four times a day (QID) | ORAL | Status: DC | PRN

## 2013-09-30 MED ORDER — CALCIUM CARBONATE 1250 MG/5ML PO SUSP
500.0000 mg | Freq: Four times a day (QID) | ORAL | Status: DC | PRN
  Filled 2013-09-30: qty 5

## 2013-09-30 MED ORDER — SODIUM CHLORIDE 0.9 % IJ SOLN
80.0000 mg | INTRAVENOUS | Status: AC
  Administered 2013-09-30: 80 mg via INTRAVENOUS

## 2013-09-30 MED ORDER — REGADENOSON 0.4 MG/5ML IV SOLN
0.4000 mg | Freq: Once | INTRAVENOUS | Status: AC
  Administered 2013-09-30: 0.4 mg via INTRAVENOUS
  Filled 2013-09-30: qty 5

## 2013-09-30 MED ORDER — SODIUM CHLORIDE 0.9 % IV SOLN
INTRAVENOUS | Status: AC
  Administered 2013-09-30: 75 mL via INTRAVENOUS
  Administered 2013-10-01: 07:00:00 via INTRAVENOUS

## 2013-09-30 MED ORDER — NITROGLYCERIN 2 % TD OINT
0.5000 [in_us] | TOPICAL_OINTMENT | Freq: Four times a day (QID) | TRANSDERMAL | Status: DC | PRN
  Filled 2013-09-30: qty 30

## 2013-09-30 MED ORDER — ONDANSETRON HCL 4 MG/2ML IJ SOLN: 4.0000 mg | Freq: Four times a day (QID) | INTRAMUSCULAR | Status: DC | PRN

## 2013-09-30 MED ORDER — ZOLPIDEM TARTRATE 5 MG PO TABS
5.0000 mg | ORAL_TABLET | Freq: Every evening | ORAL | Status: DC | PRN
  Administered 2013-09-30 – 2013-10-01 (×2): 5 mg via ORAL
  Filled 2013-09-30 (×2): qty 1

## 2013-09-30 MED ORDER — TECHNETIUM TC 99M SESTAMIBI - CARDIOLITE
30.0000 | Freq: Once | INTRAVENOUS | Status: AC | PRN
  Administered 2013-09-30: 30 via INTRAVENOUS

## 2013-09-30 MED ORDER — SORBITOL 70 % SOLN
30.0000 mL | Status: DC | PRN
  Administered 2013-09-30 (×2): 30 mL via ORAL
  Filled 2013-09-30 (×2): qty 30

## 2013-09-30 NOTE — Progress Notes (Signed)
Lexiscan myoview completed without complications, she did have increased headache and chest heaviness, this resolved with aminophylline.  Will hold NTG past for now for headache. If pain reoccurs before results back will resume.

## 2013-09-30 NOTE — Progress Notes (Signed)
[facility] PROGRESS NOTE  Patricia [name] FGH:[id number] DOB: January 12, 1975 DOA: 09/27/2013 PCP: Tereasa Coop, PA-C  Assessment/Plan: #1 chest pain Patient presented with chest pain with history of chronic kidney disease followed at [facility], history of hypertension, diabetes, hyperlipidemia and family history of coronary artery disease. VQ scan which was done was low probability for PE. Lower extremity Dopplers were negative. Patient still with complaints of chest pain. Patient also noted to have one of SVT. Renal function worse today and a such diuretics and ACE inhibitors on hold. Repeat blood work in the morning. Lopressor dose has been adjusted per cardiology. Patient s/p Myoview study which was essentially normal with EF of 63%. Cardiology following and appreciate input and recommendations.  #2 dizziness/orthostatic hypotension Orthostatics which were checked 2 days ago and was consistent with orthostasis as supine blood pressure 142/67 to a standing blood pressure of 124/69. Orthostasis has improved. Gentle hydration. Diuretics on hold.   #3 acute on chronic chronic kidney disease/glomerulonephritis Been followed at [facility]. Patient completed chemotherapy with Cytoxan 2 weeks prior to dimension. Patient with worsening renal function. Likely secondary to prerenal azotemia secondary to diuretics and ACE inhibitor. Continue to hold patient's metolazone and torsemide, lisinopril. Check a renal ultrasound. Check a fractional secretion of sodium. Hydrated with IV fluids normal saline at 75 cc per hour x1 day. Follow renal function.   #4 PAT Lopressor dose has been adjusted per cardiology.  #5 hypertension Blood pressure stable. Continue Lopressor.  #6 prophylaxis Heparin for DVT prophylaxis.  Code Status: Full Family Communication: Updated patient at bedside. Disposition Plan: Home when medically stable.   Consultants:  Cardiology: Dr. Sallyanne Kuster 09/28/2013  Procedures:  CT scan  09/28/2013  Chest x-ray 09/27/2013  Lower extremity Dopplers 09/28/2013  V/q scan 09/28/2013  Lexiscan Myoview 09/30/2013  Antibiotics: None  HPI/Subjective: Patient denies chest pain. Patient some improvement with dizziness, however has not ambulated yet today. No shortness of breath. Patient noted to have runs of SVT on telemetry yesterday.  Objective: Filed Vitals:   09/30/13 1212  BP: 124/67  Pulse: 52  Temp:   Resp:     Intake/Output Summary (Last 24 hours) at 09/30/13 1330 Last data filed at 09/30/13 1210  Gross per 24 hour  Intake      3 ml  Output      0 ml  Net      3 ml   Filed Weights   09/27/13 2308 09/29/13 0629 09/30/13 0536  Weight: 109.816 kg (242 lb 1.6 oz) 109.458 kg (241 lb 5 oz) 109.555 kg (241 lb 8.4 oz)    Exam:   General:  NAD  Cardiovascular: RRR  Respiratory: CTAB  Abdomen: Soft/NT/ND/+BS  Musculoskeletal: No c/c/e  Data Reviewed: Basic Metabolic Panel:  Recent Labs Lab 09/27/13 1440 09/28/13 0533 09/29/13 0850 09/29/13 1955 09/30/13 0150  NA 140 138 140 140 140  K 3.1* 3.7 3.3* 3.4* 3.5*  CL 104 105 104 104 103  CO2 $Re'25 22 26 25 27  'EYX$ GLUCOSE 117* 92 100* 118* 104*  BUN 42* 40* 39* 43* 41*  CREATININE 1.84* 1.86* 2.42* 2.45* 2.63*  CALCIUM 8.1* 8.1* 8.4 8.4 8.2*  MG  --   --   --  1.8  --    Liver Function Tests: No results found for this basename: AST, ALT, ALKPHOS, BILITOT, PROT, ALBUMIN,  in the last 168 hours No results found for this basename: LIPASE, AMYLASE,  in the last 168 hours No results found for this basename: AMMONIA,  in the  last 168 hours CBC:  Recent Labs Lab 09/27/13 2249 09/28/13 0533 09/29/13 0850 09/29/13 1955 09/30/13 0150  WBC 4.2 3.8* 3.7* 5.1 3.5*  HGB 10.1* 9.7* 9.9* 7.5* 9.3*  HCT 30.0* 29.0* 29.5* 22.3* 27.7*  MCV 78.3 79.5 78.9 79.1 78.7  PLT 250 251 259 298 237   Cardiac Enzymes:  Recent Labs Lab 09/27/13 1913 09/28/13 0533 09/28/13 1020 09/29/13 1955 09/30/13 0150   TROPONINI <0.30 <0.30 <0.30 <0.30 <0.30   BNP (last 3 results)  Recent Labs  01/27/13 2157 06/29/13 1426 08/21/13 0739  PROBNP 262.9* 313.9* 358.8*   CBG: No results found for this basename: GLUCAP,  in the last 168 hours  No results found for this or any previous visit (from the past 240 hour(s)).   Studies: Nm Myocar Multi W/spect W/wall Motion / Ef  09/30/2013   CLINICAL DATA:  chest pain  EXAM: MYOCARDIAL IMAGING WITH SPECT (REST AND PHARMACOLOGIC-STRESS)  GATED LEFT VENTRICULAR WALL MOTION STUDY  LEFT VENTRICULAR EJECTION FRACTION  TECHNIQUE: Standard myocardial SPECT imaging was performed after resting intravenous injection of 10 mCi Tc-49m sestamibi. Subsequently, intravenous infusion of Lexiscan was performed under the supervision of the Cardiology staff. At peak effect of the drug, 30 mCi Tc-70m sestamibi was injected intravenously and standard myocardial SPECT imaging was performed. Quantitative gated imaging was also performed to evaluate left ventricular wall motion, and estimate left ventricular ejection fraction.  COMPARISON:  None.  FINDINGS: Perfusion: Apical attenuation artifact noted, suspect related to breast size. No definite inducible or reversible ischemia with pharmacologic stress.  Wall Motion: Normal left ventricular wall motion. No left ventricular dilation.  Left Ventricular Ejection Fraction: 63 %  End diastolic volume 95 ml  End systolic volume 35 ml  IMPRESSION: 1. No reversible ischemia or infarction.  2. Normal left ventricular wall motion.  3. Left ventricular ejection fraction 63%  4. Low-risk stress test findings*.  *2012 Appropriate Use Criteria for Coronary Revascularization Focused Update: J Am Coll Cardiol. 5053;97(6):734-193. http://content.airportbarriers.com.aspx?articleid=[email or web address]   Electronically Signed   By: Daryll Brod M.D.   On: 09/30/2013 12:10    Scheduled Meds: . aspirin  325 mg Oral Daily  . atorvastatin  20 mg Oral QHS  . heparin   5,000 Units Subcutaneous 3 times per day  . metoprolol tartrate  25 mg Oral BID  . multivitamin with minerals  1 tablet Oral QPC breakfast  . pantoprazole  40 mg Oral Daily  . regadenoson      . sodium chloride  3 mL Intravenous Q12H  . sodium chloride  3 mL Intravenous Q12H  . vitamin C  500 mg Oral Daily   Continuous Infusions: . sodium chloride 75 mL (09/30/13 1304)    Principal Problem:   Chest pain with moderate risk of acute coronary syndrome Active Problems:   Morbid obesity   HTN (hypertension)   DM (diabetes mellitus)   Hypercholesterolemia   Glomerulonephritis   CKD stage 3 followed at [facility]   PAT (paroxysmal atrial tachycardia)    Time spent: 35 mins    [facility] MD [facility] Pager [phone]. If 7PM-7AM, please contact night-coverage at [email or web address], password [facility] 09/30/2013, 1:30 PM  LOS: 3 days

## 2013-09-30 NOTE — Progress Notes (Signed)
Subjective: Headache from NTG paste - had to stop NTG for stress test  Objective: Vital signs in last 24 hours: Temp:  [98.5 F (36.9 C)-98.9 F (37.2 C)] 98.9 F (37.2 C) (09/26 0536) Pulse Rate:  [55-66] 57 (09/26 0536) Resp:  [14-20] 14 (09/26 1021) BP: (100-126)/(47-71) 100/60 mmHg (09/26 1025) SpO2:  [100 %] 100 % (09/26 0536) Weight:  [241 lb 8.4 oz (109.555 kg)] 241 lb 8.4 oz (109.555 kg) (09/26 0536) Weight change: 3.4 oz (0.097 kg) Last BM Date: 09/27/13 Intake/Output from previous day: 09/25 0701 - 09/26 0700 In: 600 [P.O.:600] Out: 1000 [Urine:1000] Intake/Output this shift:    PE: General:Pleasant affect but flat due to headache, NAD Skin:Warm and dry, brisk capillary refill HEENT:normocephalic, sclera clear, mucus membranes moist Heart:S1S2 RRR without murmur, gallup, rub or click Lungs:clear, ant  without rales, rhonchi, or wheezes WEX:HBZJ, non tender, + BS, do not palpate liver spleen or masses Ext:no lower ext edema, 2+ pedal pulses, 2+ radial pulses Neuro:alert and oriented, MAE, follows commands, + facial symmetry   Lab Results:  Recent Labs  09/29/13 1955 09/30/13 0150  WBC 5.1 3.5*  HGB 7.5* 9.3*  HCT 22.3* 27.7*  PLT 298 237   BMET  Recent Labs  09/29/13 1955 09/30/13 0150  NA 140 140  K 3.4* 3.5*  CL 104 103  CO2 25 27  GLUCOSE 118* 104*  BUN 43* 41*  CREATININE 2.45* 2.63*  CALCIUM 8.4 8.2*    Recent Labs  09/29/13 1955 09/30/13 0150  TROPONINI <0.30 <0.30       Studies/Results: Nm Pulmonary Perf And Vent  09/28/2013   CLINICAL DATA:  Chest pain, elevated D-dimer, hypertension, diabetes  EXAM: NUCLEAR MEDICINE VENTILATION - PERFUSION LUNG SCAN  TECHNIQUE: Ventilation images were obtained in multiple projections using inhaled aerosol technetium 99 M DTPA. Perfusion images were obtained in multiple projections after intravenous injection of Tc-31m MAA.  RADIOPHARMACEUTICALS:  40 mCi Tc-43m DTPA aerosol and 6  mCi Tc-46m MAA  COMPARISON:  09/12/2013; radiographic correlation chest radiograph 09/27/2013  FINDINGS: Ventilation: Minimal diminished ventilation in LEFT upper lobe and lingula.  Perfusion: Minimal matching diminished perfusion in LEFT upper lobe. No additional segmental or subsegmental perfusion defects identified. Borderline enlargement of cardiac silhouette.  Chest radiograph:  Lungs clear.  Upper normal heart size.  IMPRESSION: Very low probability for pulmonary embolism.   Electronically Signed   By: Lavonia Dana M.D.   On: 09/28/2013 11:40    Medications: I have reviewed the patient's current medications. Scheduled Meds: . aspirin  325 mg Oral Daily  . atorvastatin  20 mg Oral QHS  . heparin  5,000 Units Subcutaneous 3 times per day  . metoprolol tartrate  25 mg Oral BID  . multivitamin with minerals  1 tablet Oral QPC breakfast  . nitroGLYCERIN  0.5 inch Topical 4 times per day  . pantoprazole  40 mg Oral Daily  . regadenoson      . sodium chloride  3 mL Intravenous Q12H  . sodium chloride  3 mL Intravenous Q12H  . vitamin C  500 mg Oral Daily   Continuous Infusions: . sodium chloride     PRN Meds:.sodium chloride, LORazepam, morphine injection, nitroGLYCERIN, ondansetron, oxyCODONE-acetaminophen, promethazine, sodium chloride, technetium sestamibi  Assessment/Plan: Symptoms are atypical, pain is neither pleuritic, nor anginal, but she has substantial coronary risk factors.    Pt developed increased chest pain after consult yesterday and will now have lexiscan myoview earlier  today. Troponins continue to be negative.  Headache, significant, ntg past stopped and for test.    Plan 30 day event monitor at discharge.   Principal Problem:   Chest pain with moderate risk of acute coronary syndrome Active Problems:   Morbid obesity   HTN (hypertension)   DM (diabetes mellitus)   Hypercholesterolemia   Glomerulonephritis   CKD stage 3 followed at [facility]   PAT (paroxysmal atrial  tachycardia)      LOS: 3 days   Time spent with pt. :15 minutes. [facility]  Nurse Practitioner Certified Pager [phone] or after 5pm and on weekends call [phone] 09/30/2013, 10:30 AM  Patient seen and examined, history reviewed.  The myoview is negative for ischemia.  EF 63%.  I think we can stop NTG paste.    After discharge she should have a cardiac event monitor.

## 2013-10-01 LAB — BASIC METABOLIC PANEL
Anion gap: 12 (ref 5–15)
BUN: 42 mg/dL — AB (ref 6–23)
CALCIUM: 7.9 mg/dL — AB (ref 8.4–10.5)
CO2: 22 mEq/L (ref 19–32)
CREATININE: 2.44 mg/dL — AB (ref 0.50–1.10)
Chloride: 108 mEq/L (ref 96–112)
GFR calc Af Amer: 27 mL/min — ABNORMAL LOW (ref >90)
GFR, EST NON AFRICAN AMERICAN: 24 mL/min — AB (ref >90)
GLUCOSE: 95 mg/dL (ref 70–99)
Potassium: 3.6 mEq/L — ABNORMAL LOW (ref 3.7–5.3)
Sodium: 142 mEq/L (ref 137–147)

## 2013-10-01 LAB — CBC
HCT: 28.8 % — ABNORMAL LOW (ref 36.0–46.0)
Hemoglobin: 9.6 g/dL — ABNORMAL LOW (ref 12.0–15.0)
MCH: 26.6 pg (ref 26.0–34.0)
MCHC: 33.3 g/dL (ref 30.0–36.0)
MCV: 79.8 fL (ref 78.0–100.0)
Platelets: 218 10*3/uL (ref 150–400)
RBC: 3.61 MIL/uL — ABNORMAL LOW (ref 3.87–5.11)
RDW: 13.8 % (ref 11.5–15.5)
WBC: 3.5 10*3/uL — ABNORMAL LOW (ref 4.0–10.5)

## 2013-10-01 MED ORDER — POTASSIUM CHLORIDE CRYS ER 20 MEQ PO TBCR
20.0000 meq | EXTENDED_RELEASE_TABLET | Freq: Once | ORAL | Status: AC
  Administered 2013-10-01: 20 meq via ORAL
  Filled 2013-10-01: qty 1

## 2013-10-01 MED ORDER — ONDANSETRON HCL 4 MG/2ML IJ SOLN: 4.0000 mg | Freq: Three times a day (TID) | INTRAMUSCULAR | Status: DC | PRN

## 2013-10-01 MED ORDER — HYDROMORPHONE HCL 1 MG/ML IJ SOLN: 1.0000 mg | INTRAMUSCULAR | Status: AC | PRN

## 2013-10-01 NOTE — Progress Notes (Signed)
Subjective:  Lying in bed with somewhat flat affect in quiet voice. Still with some vague chest pain. Very difficult to tell whether it is angina or not. Evidently some relief with nitroglycerin.  Objective:  Vital Signs in the last 24 hours: BP 118/53  Pulse 59  Temp(Src) 97.9 F (36.6 C) (Oral)  Resp 20  Ht $R'5\' 7"'Ao$  (1.702 m)  Wt 109.689 kg (241 lb 13.1 oz)  BMI 37.87 kg/m2  SpO2 100%  Physical Exam: Very large black female lying in bed in no acute distress Lungs:  Clear Cardiac:  Regular rhythm, normal S1 and S2, no S3 Extremities:  No edema present  Intake/Output from previous day: 09/26 0701 - 09/27 0700 In: 1588 [P.O.:240; I.V.:1348] Out: 200 [Urine:200]  Weight Filed Weights   09/29/13 0629 09/30/13 0536 10/01/13 0457  Weight: 109.458 kg (241 lb 5 oz) 109.555 kg (241 lb 8.4 oz) 109.689 kg (241 lb 13.1 oz)    Lab Results: Basic Metabolic Panel:  Recent Labs  09/30/13 0150 10/01/13 0453  NA 140 142  K 3.5* 3.6*  CL 103 108  CO2 27 22  GLUCOSE 104* 95  BUN 41* 42*  CREATININE 2.63* 2.44*   CBC:  Recent Labs  09/30/13 0150 10/01/13 0453  WBC 3.5* 3.5*  HGB 9.3* 9.6*  HCT 27.7* 28.8*  MCV 78.7 79.8  PLT 237 218   Cardiac Enzymes:  Recent Labs  09/29/13 1955 09/30/13 0150 09/30/13 1335  TROPONINI <0.30 <0.30 <0.30    Telemetry: Sinus rhythm with artifact, no ST-T noted overnight  Assessment/Plan:  1. Chest pain with atypical features-myocardial perfusion scans shows no evidence of myocardial ischemia 2. Chronic kidney disease with some worsening of renal function 3. Morbid obesity 4. Hypertension  Recommendations:  Continues to have vague chest pain. A repeat EKG the 25th and show some ST depression in V2 and V3. We'll repeat EKG today. Cardiac enzymes have all been negative. Probably worth watching in the hospital one more day.     Kerry Hough  MD [facility]  10/01/2013, 9:58 AM

## 2013-10-01 NOTE — Progress Notes (Signed)
[facility] PROGRESS NOTE  Patricia [name] OZY:[id number] DOB: [birth year] DOA: 09/27/2013 PCP: Tereasa Coop, PA-C  Assessment/Plan: #1 chest pain Patient presented with chest pain with history of chronic kidney disease followed at [facility], history of hypertension, diabetes, hyperlipidemia and family history of coronary artery disease. VQ scan which was done was low probability for PE. Lower extremity Dopplers were negative. Patient still with complaints of chest pain. Patient also noted to have SVT. Renal function starting to trend back down. Diuretics and ACE inhibitors on hold. Repeat blood work in the morning. Lopressor dose has been adjusted per cardiology. Patient s/p Myoview study which was essentially normal with EF of 63%. Cardiology following and appreciate input and recommendations.  #2 dizziness/orthostatic hypotension Orthostatics which were checked 3 days ago and was consistent with orthostasis as supine blood pressure 142/67 to a standing blood pressure of 124/69. Orthostasis has improved. Gentle hydration. Diuretics on hold.   #3 acute on chronic chronic kidney disease/glomerulonephritis Been followed at [facility]. Patient completed chemotherapy with Cytoxan 2 weeks prior to dimension. Patient with worsening renal function. Likely secondary to prerenal azotemia secondary to diuretics and ACE inhibitor. Continue to hold patient's metolazone and torsemide, lisinopril. Renal ultrasound negative for hydronephrosis. Renal function starting to trend back down. Continue IV fluids normal saline at 75 cc per hour x1 day. Follow renal function.   #4 PAT Lopressor dose was adjusted per cardiology.  #5 hypertension Blood pressure stable. Continue Lopressor.  #6 prophylaxis Heparin for DVT prophylaxis.  Code Status: Full Family Communication: Updated patient at bedside. Disposition Plan: Home when medically stable.   Consultants:  Cardiology: Dr. Sallyanne Kuster 09/28/2013  Procedures:  CT  scan 09/28/2013  Chest x-ray 09/27/2013  Lower extremity Dopplers 09/28/2013  V/q scan 09/28/2013  Lexiscan Myoview 09/30/2013  Renal US 09/30/13  Antibiotics: None  HPI/Subjective: Patient with some CP. No shortness of breath. Patient noted to have runs of SVT on telemetry 2 days ago.   Objective: Filed Vitals:   10/01/13 0904  BP: 118/53  Pulse: 59  Temp:   Resp:     Intake/Output Summary (Last 24 hours) at 10/01/13 1100 Last data filed at 10/01/13 0900  Gross per 24 hour  Intake   1888 ml  Output    200 ml  Net   1688 ml   Filed Weights   09/29/13 0629 09/30/13 0536 10/01/13 0457  Weight: 109.458 kg (241 lb 5 oz) 109.555 kg (241 lb 8.4 oz) 109.689 kg (241 lb 13.1 oz)    Exam:   General:  NAD  Cardiovascular: RRR  Respiratory: CTAB  Abdomen: Soft/NT/ND/+BS  Musculoskeletal: No c/c/e  Data Reviewed: Basic Metabolic Panel:  Recent Labs Lab 09/28/13 0533 09/29/13 0850 09/29/13 1955 09/30/13 0150 10/01/13 0453  NA 138 140 140 140 142  K 3.7 3.3* 3.4* 3.5* 3.6*  CL 105 104 104 103 108  CO2 $Re'22 26 25 27 22  'JSI$ GLUCOSE 92 100* 118* 104* 95  BUN 40* 39* 43* 41* 42*  CREATININE 1.86* 2.42* 2.45* 2.63* 2.44*  CALCIUM 8.1* 8.4 8.4 8.2* 7.9*  MG  --   --  1.8  --   --    Liver Function Tests: No results found for this basename: AST, ALT, ALKPHOS, BILITOT, PROT, ALBUMIN,  in the last 168 hours No results found for this basename: LIPASE, AMYLASE,  in the last 168 hours No results found for this basename: AMMONIA,  in the last 168 hours CBC:  Recent Labs Lab 09/28/13 0533 09/29/13 0850 09/29/13 1955  09/30/13 0150 10/01/13 0453  WBC 3.8* 3.7* 5.1 3.5* 3.5*  HGB 9.7* 9.9* 7.5* 9.3* 9.6*  HCT 29.0* 29.5* 22.3* 27.7* 28.8*  MCV 79.5 78.9 79.1 78.7 79.8  PLT 251 259 298 237 218   Cardiac Enzymes:  Recent Labs Lab 09/28/13 0533 09/28/13 1020 09/29/13 1955 09/30/13 0150 09/30/13 1335  TROPONINI <0.30 <0.30 <0.30 <0.30 <0.30   BNP (last 3  results)  Recent Labs  01/27/13 2157 06/29/13 1426 08/21/13 0739  PROBNP 262.9* 313.9* 358.8*   CBG: No results found for this basename: GLUCAP,  in the last 168 hours  No results found for this or any previous visit (from the past 240 hour(s)).   Studies: US Renal  09/30/2013   CLINICAL DATA:  Acute renal failure, history hypertension, diabetes, chronic stage 3 kidney disease  EXAM: RENAL/URINARY TRACT ULTRASOUND COMPLETE  COMPARISON:  05/17/2012 renal ultrasound, MR abdomen 01/30/2013  FINDINGS: Right Kidney:  Length: 13.3 cm. Normal cortical thickness. Upper normal cortical echogenicity. Minimal caliectasis. No gross mass or hydronephrosis. No shadowing calcification.  Left Kidney:  Length: 13.3 cm. Normal cortical thickness. Slightly increased cortical echogenicity. Question hypoechoic nodule or cyst at mid kidney. This roughly corresponds with a cyst seen on prior MR.  Bladder:  Appears normal for degree of bladder distention.  IMPRESSION: Question medical renal disease changes particularly LEFT kidney.  Probable tiny LEFT renal cyst band minimal RIGHT renal caliectasis without gross hydronephrosis.   Electronically Signed   By: Lavonia Dana M.D.   On: 09/30/2013 18:11   Nm Myocar Multi W/spect W/wall Motion / Ef  09/30/2013   CLINICAL DATA:  chest pain  EXAM: MYOCARDIAL IMAGING WITH SPECT (REST AND PHARMACOLOGIC-STRESS)  GATED LEFT VENTRICULAR WALL MOTION STUDY  LEFT VENTRICULAR EJECTION FRACTION  TECHNIQUE: Standard myocardial SPECT imaging was performed after resting intravenous injection of 10 mCi Tc-40m sestamibi. Subsequently, intravenous infusion of Lexiscan was performed under the supervision of the Cardiology staff. At peak effect of the drug, 30 mCi Tc-72m sestamibi was injected intravenously and standard myocardial SPECT imaging was performed. Quantitative gated imaging was also performed to evaluate left ventricular wall motion, and estimate left ventricular ejection fraction.   COMPARISON:  None.  FINDINGS: Perfusion: Apical attenuation artifact noted, suspect related to breast size. No definite inducible or reversible ischemia with pharmacologic stress.  Wall Motion: Normal left ventricular wall motion. No left ventricular dilation.  Left Ventricular Ejection Fraction: 63 %  End diastolic volume 95 ml  End systolic volume 35 ml  IMPRESSION: 1. No reversible ischemia or infarction.  2. Normal left ventricular wall motion.  3. Left ventricular ejection fraction 63%  4. Low-risk stress test findings*.  *2012 Appropriate Use Criteria for Coronary Revascularization Focused Update: J Am Coll Cardiol. 4098;11(9):147-829. http://content.[email or web address].aspx?articleid=1201161   Electronically Signed   By: Daryll Brod M.D.   On: 09/30/2013 12:10    Scheduled Meds: . aspirin  325 mg Oral Daily  . atorvastatin  20 mg Oral QHS  . heparin  5,000 Units Subcutaneous 3 times per day  . metoprolol tartrate  25 mg Oral BID  . multivitamin with minerals  1 tablet Oral QPC breakfast  . pantoprazole  40 mg Oral Daily  . sodium chloride  3 mL Intravenous Q12H  . sodium chloride  3 mL Intravenous Q12H  . vitamin C  500 mg Oral Daily   Continuous Infusions: . sodium chloride 75 mL/hr at 10/01/13 5621    Principal Problem:   Chest pain with moderate risk of acute  coronary syndrome Active Problems:   Morbid obesity   HTN (hypertension)   DM (diabetes mellitus)   Hypercholesterolemia   Glomerulonephritis   CKD stage 3 followed at [facility]   PAT (paroxysmal atrial tachycardia)   Chest pain    Time spent: 35 mins    [facility] MD [facility] Pager [phone]. If 7PM-7AM, please contact night-coverage at [email or web address], password [email or web address] 10/01/2013, 11:00 AM  LOS: 4 days

## 2013-10-02 DIAGNOSIS — N049 Nephrotic syndrome with unspecified morphologic changes: Secondary | ICD-10-CM

## 2013-10-02 DIAGNOSIS — R079 Chest pain, unspecified: Secondary | ICD-10-CM

## 2013-10-02 DIAGNOSIS — N179 Acute kidney failure, unspecified: Secondary | ICD-10-CM

## 2013-10-02 DIAGNOSIS — I10 Essential (primary) hypertension: Secondary | ICD-10-CM

## 2013-10-02 DIAGNOSIS — E785 Hyperlipidemia, unspecified: Secondary | ICD-10-CM

## 2013-10-02 DIAGNOSIS — I471 Supraventricular tachycardia: Secondary | ICD-10-CM

## 2013-10-02 LAB — BASIC METABOLIC PANEL
Anion gap: 9 (ref 5–15)
BUN: 40 mg/dL — ABNORMAL HIGH (ref 6–23)
CALCIUM: 8.1 mg/dL — AB (ref 8.4–10.5)
CO2: 23 mEq/L (ref 19–32)
CREATININE: 2.02 mg/dL — AB (ref 0.50–1.10)
Chloride: 108 mEq/L (ref 96–112)
GFR calc Af Amer: 34 mL/min — ABNORMAL LOW (ref >90)
GFR, EST NON AFRICAN AMERICAN: 30 mL/min — AB (ref >90)
Glucose, Bld: 112 mg/dL — ABNORMAL HIGH (ref 70–99)
Potassium: 4.1 mEq/L (ref 3.7–5.3)
SODIUM: 140 meq/L (ref 137–147)

## 2013-10-02 LAB — URINE CULTURE: Colony Count: >=100000

## 2013-10-02 MED ORDER — ASPIRIN 81 MG PO CHEW
81.0000 mg | CHEWABLE_TABLET | Freq: Every day | ORAL | Status: DC
Start: 2013-10-02 — End: 2013-10-03
  Administered 2013-10-03: 81 mg via ORAL
  Filled 2013-10-02: qty 1

## 2013-10-02 MED ORDER — SODIUM CHLORIDE 0.9 % IV SOLN
INTRAVENOUS | Status: DC
  Filled 2013-10-02: qty 1000

## 2013-10-02 NOTE — Progress Notes (Signed)
Patient: Patricia [name] / Admit Date: 09/27/2013 / Date of Encounter: 10/02/2013, 9:35 AM   Subjective: No CP or SOB, but generally just doesn't feel well. Her whole body feels tired.  Objective: Telemetry:NSR, occ SB into upper 40s-50s Physical Exam: Blood pressure 139/76, pulse 58, temperature 97.6 F (36.4 C), temperature source Axillary, resp. rate 18, height $RemoveBe'5\' 7"'IniQoOaxm$  (1.702 m), weight 239 lb 9.5 oz (108.68 kg), SpO2 100.00%. General: Well developed, well nourished obese AAF in no acute distress. Head: Normocephalic, atraumatic, sclera non-icteric, no xanthomas, nares are without discharge. Neck: Negative for carotid bruits. JVP not elevated. Lungs: Clear bilaterally to auscultation without wheezes, rales, or rhonchi. Breathing is unlabored. Heart: RRR S1 S2 without murmurs, rubs, or gallops.  Abdomen: Soft, non-tender, non-distended with normoactive bowel sounds. No rebound/guarding. Extremities: No clubbing or cyanosis. No edema. Distal pedal pulses are 2+ and equal bilaterally. Neuro: Alert and oriented X 3. Moves all extremities spontaneously. Psych:  Responds to questions appropriately with a dull affect, very quiet speaking voice.   Intake/Output Summary (Last 24 hours) at 10/02/13 0935 Last data filed at 10/02/13 0545  Gross per 24 hour  Intake      0 ml  Output   1000 ml  Net  -1000 ml    Inpatient Medications:  . aspirin  325 mg Oral Daily  . atorvastatin  20 mg Oral QHS  . heparin  5,000 Units Subcutaneous 3 times per day  . metoprolol tartrate  25 mg Oral BID  . multivitamin with minerals  1 tablet Oral QPC breakfast  . pantoprazole  40 mg Oral Daily  . sodium chloride  3 mL Intravenous Q12H  . sodium chloride  3 mL Intravenous Q12H  . vitamin C  500 mg Oral Daily   Infusions:    Labs:  Recent Labs  09/29/13 1955 09/30/13 0150 10/01/13 0453  NA 140 140 142  K 3.4* 3.5* 3.6*  CL 104 103 108  CO2 $Re'25 27 22  'NCd$ GLUCOSE 118* 104* 95  BUN 43* 41* 42*    CREATININE 2.45* 2.63* 2.44*  CALCIUM 8.4 8.2* 7.9*  MG 1.8  --   --    No results found for this basename: AST, ALT, ALKPHOS, BILITOT, PROT, ALBUMIN,  in the last 72 hours  Recent Labs  09/30/13 0150 10/01/13 0453  WBC 3.5* 3.5*  HGB 9.3* 9.6*  HCT 27.7* 28.8*  MCV 78.7 79.8  PLT 237 218    Recent Labs  09/29/13 1955 09/30/13 0150 09/30/13 1335  TROPONINI <0.30 <0.30 <0.30   No components found with this basename: POCBNP,  No results found for this basename: HGBA1C,  in the last 72 hours   Radiology/Studies:  Dg Chest 2 View  09/27/2013   CLINICAL DATA:  Shortness of breath  EXAM: CHEST  2 VIEW  COMPARISON:  08/21/2013  FINDINGS: The heart size and mediastinal contours are within normal limits. Both lungs are clear. The visualized skeletal structures are unremarkable.  IMPRESSION: No active cardiopulmonary disease.   Electronically Signed   By: Kathreen Devoid   On: 09/27/2013 16:27   Dg Knee 1-2 Views Left  09/12/2013   CLINICAL DATA:  Knee swelling and pain  EXAM: LEFT KNEE - 1-2 VIEW  COMPARISON:  None.  FINDINGS: There is no evidence of fracture, dislocation. There is a suprapatellar effusion. Soft tissues are unremarkable.  IMPRESSION: No acute fracture or dislocation.  Suprapatellar effusion.   Electronically Signed   By: Abelardo Diesel M.D.   On:  09/12/2013 18:58   US Renal  09/30/2013   CLINICAL DATA:  Acute renal failure, history hypertension, diabetes, chronic stage 3 kidney disease  EXAM: RENAL/URINARY TRACT ULTRASOUND COMPLETE  COMPARISON:  05/17/2012 renal ultrasound, MR abdomen 01/30/2013  FINDINGS: Right Kidney:  Length: 13.3 cm. Normal cortical thickness. Upper normal cortical echogenicity. Minimal caliectasis. No gross mass or hydronephrosis. No shadowing calcification.  Left Kidney:  Length: 13.3 cm. Normal cortical thickness. Slightly increased cortical echogenicity. Question hypoechoic nodule or cyst at mid kidney. This roughly corresponds with a cyst seen on  prior MR.  Bladder:  Appears normal for degree of bladder distention.  IMPRESSION: Question medical renal disease changes particularly LEFT kidney.  Probable tiny LEFT renal cyst band minimal RIGHT renal caliectasis without gross hydronephrosis.   Electronically Signed   By: Lavonia Dana M.D.   On: 09/30/2013 18:11   Nm Myocar Multi W/spect W/wall Motion / Ef  09/30/2013   CLINICAL DATA:  chest pain  EXAM: MYOCARDIAL IMAGING WITH SPECT (REST AND PHARMACOLOGIC-STRESS)  GATED LEFT VENTRICULAR WALL MOTION STUDY  LEFT VENTRICULAR EJECTION FRACTION  TECHNIQUE: Standard myocardial SPECT imaging was performed after resting intravenous injection of 10 mCi Tc-60m sestamibi. Subsequently, intravenous infusion of Lexiscan was performed under the supervision of the Cardiology staff. At peak effect of the drug, 30 mCi Tc-14m sestamibi was injected intravenously and standard myocardial SPECT imaging was performed. Quantitative gated imaging was also performed to evaluate left ventricular wall motion, and estimate left ventricular ejection fraction.  COMPARISON:  None.  FINDINGS: Perfusion: Apical attenuation artifact noted, suspect related to breast size. No definite inducible or reversible ischemia with pharmacologic stress.  Wall Motion: Normal left ventricular wall motion. No left ventricular dilation.  Left Ventricular Ejection Fraction: 63 %  End diastolic volume 95 ml  End systolic volume 35 ml  IMPRESSION: 1. No reversible ischemia or infarction.  2. Normal left ventricular wall motion.  3. Left ventricular ejection fraction 63%  4. Low-risk stress test findings*.  *2012 Appropriate Use Criteria for Coronary Revascularization Focused Update: J Am Coll Cardiol. 7672;09(4):709-628. http://content.[email or web address].aspx?articleid=1201161   Electronically Signed   By: Daryll Brod M.D.   On: 09/30/2013 12:10   Nm Pulmonary Perf And Vent  09/28/2013   CLINICAL DATA:  Chest pain, elevated D-dimer, hypertension,  diabetes  EXAM: NUCLEAR MEDICINE VENTILATION - PERFUSION LUNG SCAN  TECHNIQUE: Ventilation images were obtained in multiple projections using inhaled aerosol technetium 99 M DTPA. Perfusion images were obtained in multiple projections after intravenous injection of Tc-81m MAA.  RADIOPHARMACEUTICALS:  40 mCi Tc-4m DTPA aerosol and 6 mCi Tc-37m MAA  COMPARISON:  09/12/2013; radiographic correlation chest radiograph 09/27/2013  FINDINGS: Ventilation: Minimal diminished ventilation in LEFT upper lobe and lingula.  Perfusion: Minimal matching diminished perfusion in LEFT upper lobe. No additional segmental or subsegmental perfusion defects identified. Borderline enlargement of cardiac silhouette.  Chest radiograph:  Lungs clear.  Upper normal heart size.  IMPRESSION: Very low probability for pulmonary embolism.   Electronically Signed   By: Lavonia Dana M.D.   On: 09/28/2013 11:40   Nm Pulmonary Perf And Vent  09/12/2013   CLINICAL DATA:  Shortness of breath.  Lower extremity swelling.  EXAM: NUCLEAR MEDICINE VENTILATION - PERFUSION LUNG SCAN  TECHNIQUE: Ventilation images were obtained in multiple projections using inhaled aerosol technetium 99 M DTPA. Perfusion images were obtained in multiple projections after intravenous injection of Tc-65m MAA.  RADIOPHARMACEUTICALS:  6 mCi Tc-39m DTPA aerosol and 40 mCi Tc-85m MAA  COMPARISON:  Chest x-ray  17 2015.  V/Q scan 01/28/2013.  FINDINGS: Ventilation: Patchy tiny bilateral ventilation defects.  Perfusion: No wedge shaped peripheral perfusion defects to suggest acute pulmonary embolism.  IMPRESSION: Low probability pulmonary embolus.   Electronically Signed   By: Marcello Moores  Register   On: 09/12/2013 18:47     Assessment and Plan  1. Vague chest pain - nuc 09/30/13 negative for ischemia, EF 63%, VQ 09/28/13 and LE duplex negative; renal function prohibitive of cardiac cath/CT - EKG 9/25 with anterior T wave changes, resolved by f/u tracings - if she has recurrence,  consider GI eval given recent anemia dx 2. Palpitations/near-syncope   - PAT reported by Dr. Martinique on telemetry 9/25 but I cannot find evidence of this for some reason  - she is tolerating metoprolol without further arrhythmias - per prior notes, needs 30 day event monitor at d/c (please let us know when patient is going home) - recent orthostatic hypotension several days ago, diuretics held 3. AKI on CKD stage with minimal change glomerulonephritis/nephrotic syndrome  - on chemo, diuretics per renal at [facility]  - diuretics on hold 2/2 AKI - should have close f/u with renal 4. Recent symptomatic anemia with unclear source, possibly r/t #3  - s/p transfusion 09/2013, hemoccult reportedly neg in ED last admission  - consider decreasing aspirin to $RemoveBe'81mg'yhyvJuUPQ$  daily 5. Diabetes mellitus, now diet controlled 6. HTN, controlled 7. Dyslipidemia, on statin  Signed, Melina Copa PA-C

## 2013-10-02 NOTE — Progress Notes (Signed)
Patient seen and personally examined and chart reviewed.  Agree with note as outlined by Sharrell Ku, PA-C.   No further CP or SOB.  Nuclear stress test with no ischemia. Tolerating BB for palpitations and needs to have a 30 day monitor on discharge.  Agree with considering cutting back ASA to $Remo'81mg'vJwMb$  daily.  D/C NTG paste.  Will sign off.  Call when patient ready for discharge so we can order event monitor.

## 2013-10-02 NOTE — Progress Notes (Signed)
[facility] PROGRESS NOTE  Patricia [name] OYD:[id number] DOB: November 12, 1975 DOA: 09/27/2013 PCP: Tereasa Coop, PA-C  Assessment/Plan: #1 chest pain Patient presented with chest pain with history of chronic kidney disease followed at [facility], history of hypertension, diabetes, hyperlipidemia and family history of coronary artery disease. VQ scan which was done was low probability for PE. Lower extremity Dopplers were negative. Patient still with complaints of chest pain. Patient also noted to have SVT. Renal function starting to trend back down. Diuretics and ACE inhibitors on hold. Repeat blood work in the morning. Lopressor dose has been adjusted per cardiology. Patient s/p Myoview study which was essentially normal with EF of 63%. Cardiology following and appreciate input and recommendations.  #2 dizziness/orthostatic hypotension Orthostatics which were checked 3 days ago and was consistent with orthostasis as supine blood pressure 142/67 to a standing blood pressure of 124/69. Orthostasis has improved. Gentle hydration. Diuretics on hold.   #3 acute on chronic chronic kidney disease/glomerulonephritis Been followed at [facility]. Patient completed chemotherapy with Cytoxan 2 weeks prior to dimension. Patient with worsening renal function. Likely secondary to prerenal azotemia secondary to diuretics and ACE inhibitor. Continue to hold patient's metolazone and torsemide, lisinopril. Renal ultrasound negative for hydronephrosis. Renal function starting to trend back down. Continue IV fluids normal saline at 75 cc per hour x1 day. Follow renal function.   #4 PAT Lopressor dose was adjusted per cardiology. Will need outpatient 30 day event monitor on discharge.  #5 hypertension Blood pressure stable. Continue Lopressor.  #6 prophylaxis Heparin for DVT prophylaxis.  Code Status: Full Family Communication: Updated patient at bedside. Disposition Plan: Home when medically stable, hopefully  tomorrow.   Consultants:  Cardiology: Dr. Sallyanne Kuster 09/28/2013  Procedures:  CT scan 09/28/2013  Chest x-ray 09/27/2013  Lower extremity Dopplers 09/28/2013  V/q scan 09/28/2013  Lexiscan Myoview 09/30/2013  Renal US 09/30/13  Antibiotics: None  HPI/Subjective: Patient denies CP. No shortness of breath. Patient states not feeling too well.   Objective: Filed Vitals:   10/02/13 1106  BP: 119/59  Pulse: 63  Temp:   Resp:     Intake/Output Summary (Last 24 hours) at 10/02/13 1123 Last data filed at 10/02/13 6720  Gross per 24 hour  Intake    360 ml  Output   1000 ml  Net   -640 ml   Filed Weights   09/30/13 0536 10/01/13 0457 10/02/13 0538  Weight: 109.555 kg (241 lb 8.4 oz) 109.689 kg (241 lb 13.1 oz) 108.68 kg (239 lb 9.5 oz)    Exam:   General:  NAD  Cardiovascular: RRR  Respiratory: CTAB  Abdomen: Soft/NT/ND/+BS  Musculoskeletal: No c/c/e  Data Reviewed: Basic Metabolic Panel:  Recent Labs Lab 09/29/13 0850 09/29/13 1955 09/30/13 0150 10/01/13 0453 10/02/13 0854  NA 140 140 140 142 140  K 3.3* 3.4* 3.5* 3.6* 4.1  CL 104 104 103 108 108  CO2 $Re'26 25 27 22 23  'vnZ$ GLUCOSE 100* 118* 104* 95 112*  BUN 39* 43* 41* 42* 40*  CREATININE 2.42* 2.45* 2.63* 2.44* 2.02*  CALCIUM 8.4 8.4 8.2* 7.9* 8.1*  MG  --  1.8  --   --   --    Liver Function Tests: No results found for this basename: AST, ALT, ALKPHOS, BILITOT, PROT, ALBUMIN,  in the last 168 hours No results found for this basename: LIPASE, AMYLASE,  in the last 168 hours No results found for this basename: AMMONIA,  in the last 168 hours CBC:  Recent Labs Lab 09/28/13 0533  09/29/13 0850 09/29/13 1955 09/30/13 0150 10/01/13 0453  WBC 3.8* 3.7* 5.1 3.5* 3.5*  HGB 9.7* 9.9* 7.5* 9.3* 9.6*  HCT 29.0* 29.5* 22.3* 27.7* 28.8*  MCV 79.5 78.9 79.1 78.7 79.8  PLT 251 259 298 237 218   Cardiac Enzymes:  Recent Labs Lab 09/28/13 0533 09/28/13 1020 09/29/13 1955 09/30/13 0150  09/30/13 1335  TROPONINI <0.30 <0.30 <0.30 <0.30 <0.30   BNP (last 3 results)  Recent Labs  01/27/13 2157 06/29/13 1426 08/21/13 0739  PROBNP 262.9* 313.9* 358.8*   CBG: No results found for this basename: GLUCAP,  in the last 168 hours  Recent Results (from the past 240 hour(s))  URINE CULTURE     Status: None   Collection Time    09/30/13 12:54 PM      Result Value Ref Range Status   Specimen Description URINE, CLEAN CATCH   Final   Special Requests NONE   Final   Culture  Setup Time     Final   Value: 09/30/2013 23:01     Performed at [facility]     Final   Value: >=100,000 COLONIES/ML     Performed at [facility]   Culture     Final   Value: Multiple bacterial morphotypes present, none predominant. Suggest appropriate recollection if clinically indicated.     Performed at [facility]   Report Status 10/02/2013 FINAL   Final     Studies: US Renal  09/30/2013   CLINICAL DATA:  Acute renal failure, history hypertension, diabetes, chronic stage 3 kidney disease  EXAM: RENAL/URINARY TRACT ULTRASOUND COMPLETE  COMPARISON:  05/17/2012 renal ultrasound, MR abdomen 01/30/2013  FINDINGS: Right Kidney:  Length: 13.3 cm. Normal cortical thickness. Upper normal cortical echogenicity. Minimal caliectasis. No gross mass or hydronephrosis. No shadowing calcification.  Left Kidney:  Length: 13.3 cm. Normal cortical thickness. Slightly increased cortical echogenicity. Question hypoechoic nodule or cyst at mid kidney. This roughly corresponds with a cyst seen on prior MR.  Bladder:  Appears normal for degree of bladder distention.  IMPRESSION: Question medical renal disease changes particularly LEFT kidney.  Probable tiny LEFT renal cyst band minimal RIGHT renal caliectasis without gross hydronephrosis.   Electronically Signed   By: Lavonia Dana M.D.   On: 09/30/2013 18:11   Nm Myocar Multi W/spect W/wall Motion / Ef  09/30/2013   CLINICAL DATA:   chest pain  EXAM: MYOCARDIAL IMAGING WITH SPECT (REST AND PHARMACOLOGIC-STRESS)  GATED LEFT VENTRICULAR WALL MOTION STUDY  LEFT VENTRICULAR EJECTION FRACTION  TECHNIQUE: Standard myocardial SPECT imaging was performed after resting intravenous injection of 10 mCi Tc-8m sestamibi. Subsequently, intravenous infusion of Lexiscan was performed under the supervision of the Cardiology staff. At peak effect of the drug, 30 mCi Tc-22m sestamibi was injected intravenously and standard myocardial SPECT imaging was performed. Quantitative gated imaging was also performed to evaluate left ventricular wall motion, and estimate left ventricular ejection fraction.  COMPARISON:  None.  FINDINGS: Perfusion: Apical attenuation artifact noted, suspect related to breast size. No definite inducible or reversible ischemia with pharmacologic stress.  Wall Motion: Normal left ventricular wall motion. No left ventricular dilation.  Left Ventricular Ejection Fraction: 63 %  End diastolic volume 95 ml  End systolic volume 35 ml  IMPRESSION: 1. No reversible ischemia or infarction.  2. Normal left ventricular wall motion.  3. Left ventricular ejection fraction 63%  4. Low-risk stress test findings*.  *2012 Appropriate Use Criteria for Coronary Revascularization Focused  Update: J Am Coll Cardiol. 9169;45(0):388-828. http://[email or web address]   Electronically Signed   By: Daryll Brod M.D.   On: 09/30/2013 12:10    Scheduled Meds: . aspirin  81 mg Oral Daily  . atorvastatin  20 mg Oral QHS  . heparin  5,000 Units Subcutaneous 3 times per day  . metoprolol tartrate  25 mg Oral BID  . multivitamin with minerals  1 tablet Oral QPC breakfast  . pantoprazole  40 mg Oral Daily  . sodium chloride  3 mL Intravenous Q12H  . sodium chloride  3 mL Intravenous Q12H  . vitamin C  500 mg Oral Daily   Continuous Infusions:    Principal Problem:   Chest pain with moderate risk of acute coronary  syndrome Active Problems:   Morbid obesity   HTN (hypertension)   DM (diabetes mellitus)   Hypercholesterolemia   Glomerulonephritis   CKD stage 3 followed at [facility]   PAT (paroxysmal atrial tachycardia)   Nephrotic syndrome   Dyslipidemia    Time spent: 35 mins    [facility] MD [facility] Pager [phone]. If 7PM-7AM, please contact night-coverage at [email or web address], password [facility] 10/02/2013, 11:23 AM  LOS: 5 days

## 2013-10-03 DIAGNOSIS — N183 Chronic kidney disease, stage 3 unspecified: Secondary | ICD-10-CM

## 2013-10-03 LAB — BASIC METABOLIC PANEL
Anion gap: 11 (ref 5–15)
BUN: 38 mg/dL — ABNORMAL HIGH (ref 6–23)
CALCIUM: 7.9 mg/dL — AB (ref 8.4–10.5)
CO2: 22 meq/L (ref 19–32)
CREATININE: 1.9 mg/dL — AB (ref 0.50–1.10)
Chloride: 106 mEq/L (ref 96–112)
GFR calc Af Amer: 37 mL/min — ABNORMAL LOW (ref >90)
GFR, EST NON AFRICAN AMERICAN: 32 mL/min — AB (ref >90)
GLUCOSE: 96 mg/dL (ref 70–99)
Potassium: 3.5 mEq/L — ABNORMAL LOW (ref 3.7–5.3)
SODIUM: 139 meq/L (ref 137–147)

## 2013-10-03 MED ORDER — NITROGLYCERIN 0.4 MG SL SUBL: 0.4000 mg | SUBLINGUAL_TABLET | SUBLINGUAL | Status: DC | PRN

## 2013-10-03 MED ORDER — METOPROLOL TARTRATE 25 MG PO TABS: 25.0000 mg | ORAL_TABLET | Freq: Two times a day (BID) | ORAL | Status: DC

## 2013-10-03 MED ORDER — POTASSIUM CHLORIDE CRYS ER 20 MEQ PO TBCR
40.0000 meq | EXTENDED_RELEASE_TABLET | Freq: Once | ORAL | Status: AC
  Administered 2013-10-03: 40 meq via ORAL
  Filled 2013-10-03: qty 2

## 2013-10-03 MED ORDER — ASPIRIN 81 MG PO CHEW: 81.0000 mg | CHEWABLE_TABLET | Freq: Every day | ORAL | Status: DC

## 2013-10-03 NOTE — Discharge Summary (Signed)
Physician Discharge Summary  Patricia [name] WUX:[id number] DOB: 1975/08/28 DOA: 09/27/2013  PCP: Loyce Dys, PA-C  Admit date: 09/27/2013 Discharge date: 10/03/2013  Time spent: 65 minutes  Recommendations for Outpatient Follow-up:  1. Followup with nephrologist as previously scheduled. 2. Followup with LONG,ASHLEY B, PA-C in 1 week. On followup a basic metabolic profile need to be obtained to followup on patient's electrolytes and renal function. Patient's atypical chest on the to be reassessed and patient may benefit from a GI referral for further evaluation. 3. Patient will be called to be set up for a 30 day event monitor by cardiology.  Discharge Diagnoses:  Principal Problem:   Chest pain with moderate risk of acute coronary syndrome Active Problems:   Morbid obesity   HTN (hypertension)   DM (diabetes mellitus)   Hypercholesterolemia   Glomerulonephritis   CKD stage 3 followed at [facility]   PAT (paroxysmal atrial tachycardia)   Nephrotic syndrome   Dyslipidemia   Discharge Condition: Stable and improved  Diet recommendation: Heart healthy  Filed Weights   10/01/13 0457 10/02/13 0538 10/03/13 0500  Weight: 109.689 kg (241 lb 13.1 oz) 108.68 kg (239 lb 9.5 oz) 110.088 kg (242 lb 11.2 oz)    History of present illness:  Patricia [name] is a 38 y.o. female with PMH of HTN, HLD, DM-II, chest pain, SOB, glomerulonephritis, who presents with chest pain.  Patient has intermittent chest pain in the past 3 days. It is located at left lower chest, pressure-like, 5 of 10 in severity. It happens every 2 or 3 hours, each time lasted for few minutes. It radiates to the left shoulder and left jaw. It is not aggravated or alleviated by any known factors. It is not pleuritic, deep breath does not make the chest pain worse. She has mild dry cough. No fever or chills. She has very mild shortness of breath. She also reports chronic mild tenderness over her calf areas bilaterally (left is worse  than the right) which she attributed to chronic leg swelling. There is no significant change recently. No recent long distance traveling history. Patient has mild nausea, but no vomiting. She has very mild chronic abdominal pain over suprapubic area which has not changed in nature. She does not have diarrhea, dysuria, rashes.  Patient was found to have negative chest x-ray for pna, and no leukocytosis in ED. She is admitted to telemetry bed for observation and chest pain rule out   Hospital Course:  #1 chest pain  Patient presented with chest pain with history of chronic kidney disease followed at [facility], history of hypertension, diabetes, hyperlipidemia and family history of coronary artery disease. VQ scan which was done was low probability for PE. Lower extremity Dopplers were negative. Patient still with complaints of intermittent chest pain which improved during the hospitalization. On day of discharge patient was chest pain-free. Patient also noted to have SVT. Patient underwent a Myoview stress test on 09/30/2013 which was negative for ischemia and had an ejection fraction of 63%. Due to patient's renal function cardiac catheterization a cardiac CT was unable to be done at this time. Due to worsening renal function patient's ACE inhibitor and diuretics were subsequently discontinued. Patient was placed on Lopressor and dose adjusted secondary to PAT is noted. Patient was followed by cardiology throughout the hospitalization. Patient aspirin dose was decreased at 81 mg daily. Patient be discharged home in stable and improved condition on Lopressor and a baby aspirin. Patient will be set up for a 30 day event  monitor as outpatient. Patient be discharged in stable condition. #2 dizziness/orthostatic hypotension  Orthostatics which were checked on admission and was consistent with orthostasis as supine blood pressure 142/67 to a standing blood pressure of 124/69. Patient's diuretics were held. Patient was  placed on gentle hydration with resolution of her orthostasis.  #3 acute on chronic chronic kidney disease/glomerulonephritis  Been followed at [facility]. Patient completed chemotherapy with Cytoxan 2 weeks prior to dimension. Patient with worsening renal function. Likely secondary to prerenal azotemia secondary to diuretics and ACE inhibitor. Patient's diuretics and lisinopril were discontinued. Renal ultrasound negative for hydronephrosis. Patient was placed on gentle hydration with daily improvement in her renal function. Patient is to followup with her nephrologist one week post discharge.  #4 PAT  Patient was noted during the hospitalization to have PTs. Patient was subsequently started on Lopressor and dose adjusted per cardiology. Patient was seen and followed by cardiology throughout the hospitalization. Patient will need a 30 day event monitor as outpatient the patient will be called for this to be set up. #5 hypertension  Blood pressure stable. Maintained on Lopressor, during the hospitalization. Patient's ACE inhibitor and diuretics were discontinued secondary to acute on chronic renal failure. Outpatient followup.      Procedures: CT scan 09/28/2013  Chest x-ray 09/27/2013  Lower extremity Dopplers 09/28/2013  V/q scan 09/28/2013  Lexiscan Myoview 09/30/2013  Renal US 09/30/13   Consultations: Cardiology: Dr. Sallyanne Kuster 09/28/2013   Discharge Exam: Filed Vitals:   10/03/13 0933  BP: 136/81  Pulse: 64  Temp:   Resp:     General: NAD Cardiovascular: RRR Respiratory: CTAB  Discharge Instructions You were cared for by a hospitalist during your hospital stay. If you have any questions about your discharge medications or the care you received while you were in the hospital after you are discharged, you can call the unit and asked to speak with the hospitalist on call if the hospitalist that took care of you is not available. Once you are discharged, your primary care physician will  handle any further medical issues. Please note that NO REFILLS for any discharge medications will be authorized once you are discharged, as it is imperative that you return to your primary care physician (or establish a relationship with a primary care physician if you do not have one) for your aftercare needs so that they can reassess your need for medications and monitor your lab values.  Discharge Instructions   Diet - low sodium heart healthy    Complete by:  As directed      Discharge instructions    Complete by:  As directed   Patient will be called by cardiology to be set up for 30 day event monitor. Follow up with LONG,ASHLEY B, PA-C in 1 week. Follow up with nephrologist as scheduled.     Increase activity slowly    Complete by:  As directed           Discharge Medication List as of 10/03/2013  9:38 AM    START taking these medications   Details  aspirin 81 MG chewable tablet Chew 1 tablet (81 mg total) by mouth daily., Starting 10/03/2013, Until Discontinued, No Print      CONTINUE these medications which have CHANGED   Details  metoprolol tartrate (LOPRESSOR) 25 MG tablet Take 1 tablet (25 mg total) by mouth 2 (two) times daily., Starting 10/03/2013, Until Discontinued, Print    nitroGLYCERIN (NITROSTAT) 0.4 MG SL tablet Place 1 tablet (0.4 mg total)  under the tongue every 5 (five) minutes as needed for chest pain., Starting 10/03/2013, Until Discontinued, Print      CONTINUE these medications which have NOT CHANGED   Details  atorvastatin (LIPITOR) 20 MG tablet Take 20 mg by mouth at bedtime., Until Discontinued, Historical Med    LORazepam (ATIVAN) 0.5 MG tablet Take 0.5 mg by mouth every 6 (six) hours as needed for anxiety., Until Discontinued, Historical Med    Multiple Vitamin (MULTIVITAMIN WITH MINERALS) TABS tablet Take 1 tablet by mouth daily., Until Discontinued, Historical Med    omeprazole (PRILOSEC) 40 MG capsule Take 40 mg by mouth daily.  , Until  Discontinued, Historical Med    oxyCODONE-acetaminophen (PERCOCET/ROXICET) 5-325 MG per tablet Take 1-2 tablets by mouth every 4 (four) hours as needed for moderate pain., Starting 09/14/2013, Until Discontinued, Print    promethazine (PHENERGAN) 25 MG tablet Take 1 tablet (25 mg total) by mouth every 6 (six) hours as needed for nausea or vomiting., Starting 09/14/2013, Until Discontinued, Print    vitamin C (ASCORBIC ACID) 500 MG tablet Take 500 mg by mouth daily., Until Discontinued, Historical Med      STOP taking these medications     lisinopril (PRINIVIL,ZESTRIL) 10 MG tablet      metolazone (ZAROXOLYN) 5 MG tablet      torsemide (DEMADEX) 20 MG tablet        Allergies  Allergen Reactions  . Nsaids Other (See Comments)    Cannot take due to Kidney disease   Follow-up Information   Follow up with [facility], PA-C. (office will call you)    Specialty:  Cardiology   Contact information:   [address] [place] [address] [phone]       Follow up with LONG,ASHLEY B, PA-C. Schedule an appointment as soon as possible for a visit in 1 week.   Specialty:  Physician Assistant   Contact information:   [address] [place] [address] [phone]       Please follow up. (f/u with nephrologist as scheduled)        The results of significant diagnostics from this hospitalization (including imaging, microbiology, ancillary and laboratory) are listed below for reference.    Significant Diagnostic Studies: Dg Chest 2 View  09/27/2013   CLINICAL DATA:  Shortness of breath  EXAM: CHEST  2 VIEW  COMPARISON:  08/21/2013  FINDINGS: The heart size and mediastinal contours are within normal limits. Both lungs are clear. The visualized skeletal structures are unremarkable.  IMPRESSION: No active cardiopulmonary disease.   Electronically Signed   By: Kathreen Devoid   On: 09/27/2013 16:27   Dg Knee 1-2 Views Left  09/12/2013   CLINICAL DATA:  Knee swelling  and pain  EXAM: LEFT KNEE - 1-2 VIEW  COMPARISON:  None.  FINDINGS: There is no evidence of fracture, dislocation. There is a suprapatellar effusion. Soft tissues are unremarkable.  IMPRESSION: No acute fracture or dislocation.  Suprapatellar effusion.   Electronically Signed   By: Abelardo Diesel M.D.   On: 09/12/2013 18:58   US Renal  09/30/2013   CLINICAL DATA:  Acute renal failure, history hypertension, diabetes, chronic stage 3 kidney disease  EXAM: RENAL/URINARY TRACT ULTRASOUND COMPLETE  COMPARISON:  05/17/2012 renal ultrasound, MR abdomen 01/30/2013  FINDINGS: Right Kidney:  Length: 13.3 cm. Normal cortical thickness. Upper normal cortical echogenicity. Minimal caliectasis. No gross mass or hydronephrosis. No shadowing calcification.  Left Kidney:  Length: 13.3 cm. Normal cortical thickness. Slightly  increased cortical echogenicity. Question hypoechoic nodule or cyst at mid kidney. This roughly corresponds with a cyst seen on prior MR.  Bladder:  Appears normal for degree of bladder distention.  IMPRESSION: Question medical renal disease changes particularly LEFT kidney.  Probable tiny LEFT renal cyst band minimal RIGHT renal caliectasis without gross hydronephrosis.   Electronically Signed   By: Lavonia Dana M.D.   On: 09/30/2013 18:11   Nm Myocar Multi W/spect W/wall Motion / Ef  09/30/2013   CLINICAL DATA:  chest pain  EXAM: MYOCARDIAL IMAGING WITH SPECT (REST AND PHARMACOLOGIC-STRESS)  GATED LEFT VENTRICULAR WALL MOTION STUDY  LEFT VENTRICULAR EJECTION FRACTION  TECHNIQUE: Standard myocardial SPECT imaging was performed after resting intravenous injection of 10 mCi Tc-40m sestamibi. Subsequently, intravenous infusion of Lexiscan was performed under the supervision of the Cardiology staff. At peak effect of the drug, 30 mCi Tc-34m sestamibi was injected intravenously and standard myocardial SPECT imaging was performed. Quantitative gated imaging was also performed to evaluate left ventricular wall  motion, and estimate left ventricular ejection fraction.  COMPARISON:  None.  FINDINGS: Perfusion: Apical attenuation artifact noted, suspect related to breast size. No definite inducible or reversible ischemia with pharmacologic stress.  Wall Motion: Normal left ventricular wall motion. No left ventricular dilation.  Left Ventricular Ejection Fraction: 63 %  End diastolic volume 95 ml  End systolic volume 35 ml  IMPRESSION: 1. No reversible ischemia or infarction.  2. Normal left ventricular wall motion.  3. Left ventricular ejection fraction 63%  4. Low-risk stress test findings*.  *2012 Appropriate Use Criteria for Coronary Revascularization Focused Update: J Am Coll Cardiol. 7494;49(6):759-163. http://content.airportbarriers.com.aspx?articleid=1201161   Electronically Signed   By: Daryll Brod M.D.   On: 09/30/2013 12:10   Nm Pulmonary Perf And Vent  09/28/2013   CLINICAL DATA:  Chest pain, elevated D-dimer, hypertension, diabetes  EXAM: NUCLEAR MEDICINE VENTILATION - PERFUSION LUNG SCAN  TECHNIQUE: Ventilation images were obtained in multiple projections using inhaled aerosol technetium 99 M DTPA. Perfusion images were obtained in multiple projections after intravenous injection of Tc-40m MAA.  RADIOPHARMACEUTICALS:  40 mCi Tc-29m DTPA aerosol and 6 mCi Tc-89m MAA  COMPARISON:  09/12/2013; radiographic correlation chest radiograph 09/27/2013  FINDINGS: Ventilation: Minimal diminished ventilation in LEFT upper lobe and lingula.  Perfusion: Minimal matching diminished perfusion in LEFT upper lobe. No additional segmental or subsegmental perfusion defects identified. Borderline enlargement of cardiac silhouette.  Chest radiograph:  Lungs clear.  Upper normal heart size.  IMPRESSION: Very low probability for pulmonary embolism.   Electronically Signed   By: Lavonia Dana M.D.   On: 09/28/2013 11:40   Nm Pulmonary Perf And Vent  09/12/2013   CLINICAL DATA:  Shortness of breath.  Lower extremity swelling.   EXAM: NUCLEAR MEDICINE VENTILATION - PERFUSION LUNG SCAN  TECHNIQUE: Ventilation images were obtained in multiple projections using inhaled aerosol technetium 99 M DTPA. Perfusion images were obtained in multiple projections after intravenous injection of Tc-13m MAA.  RADIOPHARMACEUTICALS:  6 mCi Tc-82m DTPA aerosol and 40 mCi Tc-65m MAA  COMPARISON:  Chest x-ray 17 2015.  V/Q scan 01/28/2013.  FINDINGS: Ventilation: Patchy tiny bilateral ventilation defects.  Perfusion: No wedge shaped peripheral perfusion defects to suggest acute pulmonary embolism.  IMPRESSION: Low probability pulmonary embolus.   Electronically Signed   By: Marcello Moores  Register   On: 09/12/2013 18:47    Microbiology: Recent Results (from the past 240 hour(s))  URINE CULTURE     Status: None   Collection Time  09/30/13 12:54 PM      Result Value Ref Range Status   Specimen Description URINE, CLEAN CATCH   Final   Special Requests NONE   Final   Culture  Setup Time     Final   Value: 09/30/2013 23:01     Performed at [facility] Count     Final   Value: >=100,000 COLONIES/ML     Performed at [facility]   Culture     Final   Value: Multiple bacterial morphotypes present, none predominant. Suggest appropriate recollection if clinically indicated.     Performed at [facility]   Report Status 10/02/2013 FINAL   Final     Labs: Basic Metabolic Panel:  Recent Labs Lab 09/29/13 1955 09/30/13 0150 10/01/13 0453 10/02/13 0854 10/03/13 0540  NA 140 140 142 140 139  K 3.4* 3.5* 3.6* 4.1 3.5*  CL 104 103 108 108 106  CO2 $Re'25 27 22 23 22  'mNC$ GLUCOSE 118* 104* 95 112* 96  BUN 43* 41* 42* 40* 38*  CREATININE 2.45* 2.63* 2.44* 2.02* 1.90*  CALCIUM 8.4 8.2* 7.9* 8.1* 7.9*  MG 1.8  --   --   --   --    Liver Function Tests: No results found for this basename: AST, ALT, ALKPHOS, BILITOT, PROT, ALBUMIN,  in the last 168 hours No results found for this basename: LIPASE, AMYLASE,  in the last  168 hours No results found for this basename: AMMONIA,  in the last 168 hours CBC:  Recent Labs Lab 09/28/13 0533 09/29/13 0850 09/29/13 1955 09/30/13 0150 10/01/13 0453  WBC 3.8* 3.7* 5.1 3.5* 3.5*  HGB 9.7* 9.9* 7.5* 9.3* 9.6*  HCT 29.0* 29.5* 22.3* 27.7* 28.8*  MCV 79.5 78.9 79.1 78.7 79.8  PLT 251 259 298 237 218   Cardiac Enzymes:  Recent Labs Lab 09/28/13 0533 09/28/13 1020 09/29/13 1955 09/30/13 0150 09/30/13 1335  TROPONINI <0.30 <0.30 <0.30 <0.30 <0.30   BNP: BNP (last 3 results)  Recent Labs  01/27/13 2157 06/29/13 1426 08/21/13 0739  PROBNP 262.9* 313.9* 358.8*   CBG: No results found for this basename: GLUCAP,  in the last 168 hours     Signed:  [facility] MD [facility] 10/03/2013, 3:54 PM

## 2013-10-04 ENCOUNTER — Telehealth: Payer: Self-pay | Admitting: Cardiology

## 2013-10-04 NOTE — Telephone Encounter (Signed)
Closed encounter °

## 2013-10-26 ENCOUNTER — Encounter: Payer: Self-pay | Admitting: Cardiology

## 2013-10-26 ENCOUNTER — Ambulatory Visit (INDEPENDENT_AMBULATORY_CARE_PROVIDER_SITE_OTHER): Payer: Medicaid Other | Admitting: Cardiology

## 2013-10-26 VITALS — BP 144/89 | HR 65 | Ht 67.0 in | Wt 245.7 lb

## 2013-10-26 DIAGNOSIS — I471 Supraventricular tachycardia: Secondary | ICD-10-CM

## 2013-10-26 DIAGNOSIS — I10 Essential (primary) hypertension: Secondary | ICD-10-CM

## 2013-10-26 DIAGNOSIS — N183 Chronic kidney disease, stage 3 unspecified: Secondary | ICD-10-CM

## 2013-10-26 DIAGNOSIS — E7800 Pure hypercholesterolemia, unspecified: Secondary | ICD-10-CM

## 2013-10-26 DIAGNOSIS — R079 Chest pain, unspecified: Secondary | ICD-10-CM

## 2013-10-26 NOTE — Patient Instructions (Signed)
Your physician recommends that you schedule a follow-up appointment in: [place] has recommended that you wear an event monitor. Event monitors are medical devices that record the heart's electrical activity. Doctors most often [place] these monitors to diagnose arrhythmias. Arrhythmias are problems with the speed or rhythm of the heartbeat. The monitor is a small, portable device. You can wear one while you do your normal daily activities. This is usually used to diagnose what is causing palpitations/syncope (passing out).1 Month  '

## 2013-10-26 NOTE — Assessment & Plan Note (Signed)
Myoview, echo, and VQ negative during recent admission.

## 2013-10-26 NOTE — Assessment & Plan Note (Signed)
Controlled.  

## 2013-10-26 NOTE — Progress Notes (Signed)
10/26/2013 Patricia [name]   Feb 16, 1975  [id number]  Primary Physicia LONG,ASHLEY B, PA-C Primary Cardiologist: Dr Sallyanne Kuster  HPI:  This is a 38 y.o. female with a past medical history significant for chronic renal disease, followed at [facility]. Her baseline SCr is around 2. She has HTN, DM, dyslipidemia, and a FMHX of CAD. She was seen by [facility] in 2012 and had a Myoview that was negative. She has not seen a cardiologist since then. She was recently admitted with symptomatic anemia and was transfused. She also had a knee effusion tapped. She was discharged 09/14/13. She came back to [facility] 09/27/13 with Lt sided chest pain and SOB. Echo was normal. Troponin negative, VQ negative, LE dopplers negative for DVT, and EKG was WNL. Myoview was negative. She did have documented PAT on telemetry and has had some palpitations. Beta blocker was added. She is in the office today for follow up.     Current Outpatient Prescriptions  Medication Sig Dispense Refill  . aspirin 81 MG chewable tablet Chew 1 tablet (81 mg total) by mouth daily.      Marland Kitchen atorvastatin (LIPITOR) 20 MG tablet Take 20 mg by mouth at bedtime.      Marland Kitchen LORazepam (ATIVAN) 0.5 MG tablet Take 0.5 mg by mouth every 6 (six) hours as needed for anxiety.      . metoprolol tartrate (LOPRESSOR) 25 MG tablet Take 1 tablet (25 mg total) by mouth 2 (two) times daily.  62 tablet  0  . Multiple Vitamin (MULTIVITAMIN WITH MINERALS) TABS tablet Take 1 tablet by mouth daily.      . nitroGLYCERIN (NITROSTAT) 0.4 MG SL tablet Place 1 tablet (0.4 mg total) under the tongue every 5 (five) minutes as needed for chest pain.  15 tablet  0  . omeprazole (PRILOSEC) 40 MG capsule Take 40 mg by mouth daily.        Marland Kitchen oxyCODONE-acetaminophen (PERCOCET/ROXICET) 5-325 MG per tablet Take 1-2 tablets by mouth every 4 (four) hours as needed for moderate pain.  65 tablet  0  . promethazine (PHENERGAN) 25 MG tablet Take 1 tablet (25 mg total) by mouth every 6 (six) hours  as needed for nausea or vomiting.  65 tablet  1  . vitamin C (ASCORBIC ACID) 500 MG tablet Take 500 mg by mouth daily.       No current facility-administered medications for this visit.    Allergies  Allergen Reactions  . Nsaids Other (See Comments)    Cannot take due to Kidney disease    History   Social History  . Marital Status: Single    Spouse Name: N/A    Number of Children: N/A  . Years of Education: N/A   Occupational History  . Not on file.   Social History Main Topics  . Smoking status: Never Smoker   . Smokeless tobacco: Never Used  . Alcohol Use: No  . Drug Use: No  . Sexual Activity: No   Other Topics Concern  . Not on file   Social History Narrative  . No narrative on file     Review of Systems: General: negative for chills, fever, night sweats or weight changes.  Cardiovascular: negative for chest pain, dyspnea on exertion, edema, orthopnea, palpitations, paroxysmal nocturnal dyspnea or shortness of breath Dermatological: negative for rash Respiratory: negative for cough or wheezing Urologic: negative for hematuria Abdominal: negative for nausea, vomiting, diarrhea, bright red blood per rectum, melena, or hematemesis Neurologic: negative for visual changes,  syncope, or dizziness All other systems reviewed and are otherwise negative except as noted above.    Blood pressure 144/89, pulse 65, height $RemoveBe'5\' 7"'[id number]$  (1.702 m), weight 245 lb 11.2 oz (111.449 kg).  General appearance: alert, cooperative, no distress and moderately obese Lungs: clear to auscultation bilaterally Heart: regular rate and rhythm   ASSESSMENT AND PLAN:   Chest pain with moderate risk of acute coronary syndrome Myoview, echo, and VQ negative during recent admission.  PAT (paroxysmal atrial tachycardia) PAT documented during recent admission. Beta blocker added. She needs 30 day event.   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   DM (diabetes  mellitus) Diet controlled  HTN (hypertension) Controlled  Hypercholesterolemia On statin  Morbid obesity BMI 38   PLAN  Check 30 day event as suggested. F/U Dr Sallyanne Kuster after this.   Patricia [name] KPA-C 10/26/2013 11:34 AM

## 2013-10-26 NOTE — Assessment & Plan Note (Signed)
BMI 38 

## 2013-10-26 NOTE — Assessment & Plan Note (Signed)
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

## 2013-10-26 NOTE — Assessment & Plan Note (Signed)
PAT documented during recent admission. Beta blocker added. She needs 30 day event.

## 2013-10-26 NOTE — Assessment & Plan Note (Signed)
Diet controlled.  

## 2013-10-26 NOTE — Assessment & Plan Note (Signed)
On statin.

## 2013-11-20 ENCOUNTER — Encounter (HOSPITAL_COMMUNITY): Payer: Self-pay | Admitting: *Deleted

## 2013-11-20 DIAGNOSIS — R0789 Other chest pain: Secondary | ICD-10-CM | POA: Insufficient documentation

## 2013-11-20 DIAGNOSIS — Z7982 Long term (current) use of aspirin: Secondary | ICD-10-CM | POA: Insufficient documentation

## 2013-11-20 DIAGNOSIS — F419 Anxiety disorder, unspecified: Secondary | ICD-10-CM | POA: Insufficient documentation

## 2013-11-20 DIAGNOSIS — E780 Pure hypercholesterolemia: Secondary | ICD-10-CM | POA: Diagnosis not present

## 2013-11-20 DIAGNOSIS — K219 Gastro-esophageal reflux disease without esophagitis: Secondary | ICD-10-CM | POA: Diagnosis not present

## 2013-11-20 DIAGNOSIS — E119 Type 2 diabetes mellitus without complications: Secondary | ICD-10-CM | POA: Insufficient documentation

## 2013-11-20 DIAGNOSIS — Z79899 Other long term (current) drug therapy: Secondary | ICD-10-CM | POA: Diagnosis not present

## 2013-11-20 DIAGNOSIS — Z9889 Other specified postprocedural states: Secondary | ICD-10-CM | POA: Insufficient documentation

## 2013-11-20 DIAGNOSIS — R51 Headache: Secondary | ICD-10-CM | POA: Insufficient documentation

## 2013-11-20 DIAGNOSIS — E876 Hypokalemia: Secondary | ICD-10-CM | POA: Insufficient documentation

## 2013-11-20 DIAGNOSIS — D649 Anemia, unspecified: Secondary | ICD-10-CM | POA: Insufficient documentation

## 2013-11-20 DIAGNOSIS — N183 Chronic kidney disease, stage 3 (moderate): Secondary | ICD-10-CM | POA: Insufficient documentation

## 2013-11-20 DIAGNOSIS — I129 Hypertensive chronic kidney disease with stage 1 through stage 4 chronic kidney disease, or unspecified chronic kidney disease: Secondary | ICD-10-CM | POA: Insufficient documentation

## 2013-11-20 DIAGNOSIS — R002 Palpitations: Secondary | ICD-10-CM | POA: Insufficient documentation

## 2013-11-20 NOTE — ED Notes (Signed)
Pt reports left side chest pain and palpitations for 2 days. Pt states she was placed on a heart monitor October 22, pt removed heart monitor yesterday because the leads were irritating her skin. Pt reports some shortness of breath with the pain. No other associated symptoms.

## 2013-11-21 ENCOUNTER — Emergency Department (HOSPITAL_COMMUNITY): Payer: Medicaid Other

## 2013-11-21 ENCOUNTER — Emergency Department (HOSPITAL_COMMUNITY)
Admission: EM | Admit: 2013-11-21 | Discharge: 2013-11-21 | Disposition: A | Discharge status: Home / Self Care | Payer: Medicaid Other | Attending: Emergency Medicine | Admitting: Emergency Medicine

## 2013-11-21 DIAGNOSIS — R0789 Other chest pain: Secondary | ICD-10-CM

## 2013-11-21 DIAGNOSIS — R002 Palpitations: Secondary | ICD-10-CM

## 2013-11-21 DIAGNOSIS — R079 Chest pain, unspecified: Secondary | ICD-10-CM

## 2013-11-21 DIAGNOSIS — E876 Hypokalemia: Secondary | ICD-10-CM

## 2013-11-21 LAB — COMPREHENSIVE METABOLIC PANEL
ALT: 16 U/L (ref 0–35)
AST: 27 U/L (ref 0–37)
Albumin: 1.3 g/dL — ABNORMAL LOW (ref 3.5–5.2)
Alkaline Phosphatase: 50 U/L (ref 39–117)
Anion gap: 12 (ref 5–15)
BUN: 33 mg/dL — ABNORMAL HIGH (ref 6–23)
CO2: 27 meq/L (ref 19–32)
CREATININE: 2.41 mg/dL — AB (ref 0.50–1.10)
Calcium: 8.4 mg/dL (ref 8.4–10.5)
Chloride: 103 mEq/L (ref 96–112)
GFR calc Af Amer: 28 mL/min — ABNORMAL LOW (ref >90)
GFR, EST NON AFRICAN AMERICAN: 24 mL/min — AB (ref >90)
Glucose, Bld: 115 mg/dL — ABNORMAL HIGH (ref 70–99)
POTASSIUM: 3.3 meq/L — AB (ref 3.7–5.3)
Sodium: 142 mEq/L (ref 137–147)
Total Protein: 5.1 g/dL — ABNORMAL LOW (ref 6.0–8.3)

## 2013-11-21 LAB — I-STAT TROPONIN, ED: Troponin i, poc: 0 ng/mL (ref 0.00–0.08)

## 2013-11-21 LAB — CBC
HEMATOCRIT: 30.9 % — AB (ref 36.0–46.0)
Hemoglobin: 10.1 g/dL — ABNORMAL LOW (ref 12.0–15.0)
MCH: 26.6 pg (ref 26.0–34.0)
MCHC: 32.7 g/dL (ref 30.0–36.0)
MCV: 81.5 fL (ref 78.0–100.0)
Platelets: 411 10*3/uL — ABNORMAL HIGH (ref 150–400)
RBC: 3.79 MIL/uL — AB (ref 3.87–5.11)
RDW: 13.6 % (ref 11.5–15.5)
WBC: 5.9 10*3/uL (ref 4.0–10.5)

## 2013-11-21 MED ORDER — POTASSIUM CHLORIDE CRYS ER 20 MEQ PO TBCR
40.0000 meq | EXTENDED_RELEASE_TABLET | Freq: Once | ORAL | Status: AC
  Administered 2013-11-21: 40 meq via ORAL
  Filled 2013-11-21: qty 2

## 2013-11-21 NOTE — ED Provider Notes (Signed)
CSN: [id number]     Arrival date & time 11/20/13  2332 History   First MD Initiated Contact with Patient 11/21/13 0239     Chief Complaint  Patient presents with  . Chest Pain  . Palpitations     (Consider location/radiation/quality/duration/timing/severity/associated sxs/prior Treatment) HPI 38 year old female presents to the emergency department from [facility] with complaint of palpitations and chest pressure.  Symptoms have been ongoing since Saturday.  Patient recently admitted with chest pain and palpitations noted to have paroxysmal atrial tachycardia.  She had negative VQ scan and Myoview done during that hospitalization.  Patient was on an event monitor which she removed a few days ago because the leads were irritating her skin.  Patient has not yet followed back up with cardiology.  Patient was placed on metoprolol 25 mg twice a day.  She does not feel this helped her symptoms.  Patient reports that when she is active she gets short of breath and dizzy.  The palpitations last for just a few seconds.  They are worse with activity.  Chest pressure is intermittent, not always associated with activity or palpitations.  She reports that she is having chest pressure at this moment.  Patient has history of hypertension, hypercholesterolemia, diabetes, anemia status post blood transfusion recently, and chronic kidney disease. Past Medical History  Diagnosis Date  . Hypertension   . Acid reflux   . High cholesterol   . Diabetes mellitus     "took me off my RX ~ 04/2013"  . Anemia   . History of blood transfusion     "related to low counts"  . Headache(784.0)     "related to chemo; sometimes weekly" (09/12/2013)  . Chronic kidney disease (CKD), stage III (moderate)   . 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 Ankle fracture surgery Right 1994  . Abdominal hysterectomy  2010    "laparoscopic"  . Cardiac  catheterization  2000's   Family History  Problem Relation Age of Onset  . Hypertension Mother   . Thyroid disease Mother   . Coronary artery disease Father   . Hypertension Father   . Diabetes Father    History  Substance Use Topics  . Smoking status: Never Smoker   . Smokeless tobacco: Never Used  . Alcohol Use: No   OB History    No data available     Review of Systems   See History of Present Illness; otherwise all other systems are reviewed and negative  Allergies  Nsaids  Home Medications   Prior to Admission medications   Medication Sig Start Date End Date Taking? Authorizing Provider  amLODipine (NORVASC) 10 MG tablet Take 10 mg by mouth daily.   Yes Historical Provider, MD  aspirin 81 MG chewable tablet Chew 1 tablet (81 mg total) by mouth daily. 10/03/13  Yes Eugenie Filler, MD  atorvastatin (LIPITOR) 20 MG tablet Take 20 mg by mouth at bedtime.   Yes Historical Provider, MD  LORazepam (ATIVAN) 0.5 MG tablet Take 0.5 mg by mouth every 6 (six) hours as needed for anxiety.   Yes Historical Provider, MD  metoprolol tartrate (LOPRESSOR) 25 MG tablet Take 1 tablet (25 mg total) by mouth 2 (two) times daily. 10/03/13  Yes Eugenie Filler, MD  Multiple Vitamin (MULTIVITAMIN WITH MINERALS) TABS tablet Take 1 tablet by mouth daily.   Yes Historical Provider, MD  nitroGLYCERIN (NITROSTAT) 0.4  MG SL tablet Place 1 tablet (0.4 mg total) under the tongue every 5 (five) minutes as needed for chest pain. 10/03/13  Yes Eugenie Filler, MD  omeprazole (PRILOSEC) 40 MG capsule Take 40 mg by mouth daily.     Yes Historical Provider, MD  promethazine (PHENERGAN) 25 MG tablet Take 1 tablet (25 mg total) by mouth every 6 (six) hours as needed for nausea or vomiting. 09/14/13  Yes Theodis Blaze, MD  vitamin C (ASCORBIC ACID) 500 MG tablet Take 500 mg by mouth daily.   Yes Historical Provider, MD  oxyCODONE-acetaminophen (PERCOCET/ROXICET) 5-325 MG per tablet Take 1-2 tablets by mouth  every 4 (four) hours as needed for moderate pain. Patient not taking: Reported on 11/21/2013 09/14/13   Theodis Blaze, MD   BP 130/80 mmHg  Pulse 70  Temp(Src) 98.7 F (37.1 C) (Oral)  Resp 20  Ht $R'5\' 7"'Bc$  (1.702 m)  Wt 240 lb (108.863 kg)  BMI 37.58 kg/m2  SpO2 100% Physical Exam  Constitutional: She is oriented to person, place, and time. She appears well-developed and well-nourished. She appears distressed (anxious appearing).  HENT:  Head: Normocephalic and atraumatic.  Nose: Nose normal.  Mouth/Throat: Oropharynx is clear and moist.  Eyes: Conjunctivae and EOM are normal. Pupils are equal, round, and reactive to light.  Neck: Normal range of motion. Neck supple. No JVD present. No tracheal deviation present. No thyromegaly present.  Cardiovascular: Normal rate, regular rhythm, normal heart sounds and intact distal pulses.  Exam reveals no gallop and no friction rub.   No murmur heard. Pulmonary/Chest: Effort normal and breath sounds normal. No stridor. No respiratory distress. She has no wheezes. She has no rales. She exhibits no tenderness.  Abdominal: Soft. Bowel sounds are normal. She exhibits no distension and no mass. There is no tenderness. There is no rebound and no guarding.  Musculoskeletal: Normal range of motion. She exhibits no edema or tenderness.  Lymphadenopathy:    She has no cervical adenopathy.  Neurological: She is alert and oriented to person, place, and time. She displays normal reflexes. She exhibits normal muscle tone. Coordination normal.  Skin: Skin is warm and dry. No rash noted. No erythema. No pallor.  Psychiatric: She has a normal mood and affect. Her behavior is normal. Judgment and thought content normal.  Nursing note and vitals reviewed.   ED Course  Procedures (including critical care time) Labs Review Labs Reviewed  CBC - Abnormal; Notable for the following:    RBC 3.79 (*)    Hemoglobin 10.1 (*)    HCT 30.9 (*)    Platelets 411 (*)    All  other components within normal limits  COMPREHENSIVE METABOLIC PANEL - Abnormal; Notable for the following:    Potassium 3.3 (*)    Glucose, Bld 115 (*)    BUN 33 (*)    Creatinine, Ser 2.41 (*)    Total Protein 5.1 (*)    Albumin 1.3 (*)    Total Bilirubin <0.2 (*)    GFR calc non Af Amer 24 (*)    GFR calc Af Amer 28 (*)    All other components within normal limits  I-STAT TROPOININ, ED    Imaging Review Dg Chest 2 View  11/21/2013   CLINICAL DATA:  Left chest pain for 3 days.  EXAM: CHEST  2 VIEW  COMPARISON:  09/27/2013  FINDINGS: The heart size and mediastinal contours are within normal limits. Both lungs are clear. The visualized skeletal structures are  unremarkable.  IMPRESSION: No active cardiopulmonary disease.   Electronically Signed   By: Lucienne Capers M.D.   On: 11/21/2013 00:20     EKG Interpretation   Date/Time:  Monday November 20 2013 23:37:20 EST Ventricular Rate:  73 PR Interval:  148 QRS Duration: 92 QT Interval:  422 QTC Calculation: 464 R Axis:   58 Text Interpretation:  Normal sinus rhythm Normal ECG Confirmed by Dontrell Stuck   MD, Adair Lemar ([id number]) on 11/21/2013 2:46:23 AM      MDM   Final diagnoses:  Palpitations  Chest pressure  Low blood potassium    38 year old female with reported palpitations and chest pressure.  Patient recently had inpatient workup without specific findings, aside from PA T.  Patient discontinued her event monitor early, has not yet followed up with cardiology.  Her workup here tonight shows no tachycardia, normal chest x-ray no signs of fluid overload.  Her potassium is slightly low and will replete here in the emergency department.  I've advised the patient that she will need to follow-up with cardiology, and may need further workup through PCP or GI if a cardiac etiology of her symptoms is not apparent.  I do not feel the patient has any life-threatening issues that would require further monitoring or admission.    Kalman Drape, MD 11/21/13 [id number]

## 2013-11-21 NOTE — ED Notes (Signed)
Patient is alert and orientedx4.  Patient was explained discharge instructions and they understood them with no questions.  The patient's daughter, Naleah Kofoed is taking the patient home.

## 2013-11-21 NOTE — Discharge Instructions (Signed)
Chest Pain (Nonspecific) It is often hard to give a specific diagnosis for the cause of chest pain. There is always a chance that your pain could be related to something serious, such as a heart attack or a blood clot in the lungs. You need to follow up with your health care provider for further evaluation. CAUSES   Heartburn.  Pneumonia or bronchitis.  Anxiety or stress.  Inflammation around your heart (pericarditis) or lung (pleuritis or pleurisy).  A blood clot in the lung.  A collapsed lung (pneumothorax). It can develop suddenly on its own (spontaneous pneumothorax) or from trauma to the chest.  Shingles infection (herpes zoster virus). The chest wall is composed of bones, muscles, and cartilage. Any of these can be the source of the pain.  The bones can be bruised by injury.  The muscles or cartilage can be strained by coughing or overwork.  The cartilage can be affected by inflammation and become sore (costochondritis). DIAGNOSIS  Lab tests or other studies may be needed to find the cause of your pain. Your health care provider may have you take a test called an ambulatory electrocardiogram (ECG). An ECG records your heartbeat patterns over a 24-hour period. You may also have other tests, such as:  Transthoracic echocardiogram (TTE). During echocardiography, sound waves are used to evaluate how blood flows through your heart.  Transesophageal echocardiogram (TEE).  Cardiac monitoring. This allows your health care provider to monitor your heart rate and rhythm in real time.  Holter monitor. This is a portable device that records your heartbeat and can help diagnose heart arrhythmias. It allows your health care provider to track your heart activity for several days, if needed.  Stress tests by exercise or by giving medicine that makes the heart beat faster. TREATMENT   Treatment depends on what may be causing your chest pain. Treatment may include:  Acid blockers for  heartburn.  Anti-inflammatory medicine.  Pain medicine for inflammatory conditions.  Antibiotics if an infection is present.  You may be advised to change lifestyle habits. This includes stopping smoking and avoiding alcohol, caffeine, and chocolate.  You may be advised to keep your head raised (elevated) when sleeping. This reduces the chance of acid going backward from your stomach into your esophagus. Most of the time, nonspecific chest pain will improve within 2-3 days with rest and mild pain medicine.  HOME CARE INSTRUCTIONS   If antibiotics were prescribed, take them as directed. Finish them even if you start to feel better.  For the next few days, avoid physical activities that bring on chest pain. Continue physical activities as directed.  Do not use any tobacco products, including cigarettes, chewing tobacco, or electronic cigarettes.  Avoid drinking alcohol.  Only take medicine as directed by your health care provider.  Follow your health care provider's suggestions for further testing if your chest pain does not go away.  Keep any follow-up appointments you made. If you do not go to an appointment, you could develop lasting (chronic) problems with pain. If there is any problem keeping an appointment, call to reschedule. SEEK MEDICAL CARE IF:   Your chest pain does not go away, even after treatment.  You have a rash with blisters on your chest.  You have a fever. SEEK IMMEDIATE MEDICAL CARE IF:   You have increased chest pain or pain that spreads to your arm, neck, jaw, back, or abdomen.  You have shortness of breath.  You have an increasing cough, or you cough  up blood.  You have severe back or abdominal pain.  You feel nauseous or vomit.  You have severe weakness.  You faint.  You have chills. This is an emergency. Do not wait to see if the pain will go away. Get medical help at once. Call your local emergency services (911 in [place] Do not drive  yourself to the hospital. MAKE SURE YOU:   Understand these instructions.  Will watch your condition.  Will get help right away if you are not doing well or get worse. Document Released: 10/01/2004 Document Revised: 12/27/2012 Document Reviewed: 07/28/2007 [facility] Patient Information 2015 [place], [place]. This information is not intended to replace advice given to you by your health care provider. Make sure you discuss any questions you have with your health care provider.   Palpitations A palpitation is the feeling that your heartbeat is irregular or is faster than normal. It may feel like your heart is fluttering or skipping a beat. Palpitations are usually not a serious problem. However, in some cases, you may need further medical evaluation. CAUSES  Palpitations can be caused by:  Smoking.  Caffeine or other stimulants, such as diet pills or energy drinks.  Alcohol.  Stress and anxiety.  Strenuous physical activity.  Fatigue.  Certain medicines.  Heart disease, especially if you have a history of irregular heart rhythms (arrhythmias), such as atrial fibrillation, atrial flutter, or supraventricular tachycardia.  An improperly working pacemaker or defibrillator. DIAGNOSIS  To find the cause of your palpitations, your health care provider will take your medical history and perform a physical exam. Your health care provider may also have you take a test called an ambulatory electrocardiogram (ECG). An ECG records your heartbeat patterns over a 24-hour period. You may also have other tests, such as:  Transthoracic echocardiogram (TTE). During echocardiography, sound waves are used to evaluate how blood flows through your heart.  Transesophageal echocardiogram (TEE).  Cardiac monitoring. This allows your health care provider to monitor your heart rate and rhythm in real time.  Holter monitor. This is a portable device that records your heartbeat and can help diagnose heart  arrhythmias. It allows your health care provider to track your heart activity for several days, if needed.  Stress tests by exercise or by giving medicine that makes the heart beat faster. TREATMENT  Treatment of palpitations depends on the cause of your symptoms and can vary greatly. Most cases of palpitations do not require any treatment other than time, relaxation, and monitoring your symptoms. Other causes, such as atrial fibrillation, atrial flutter, or supraventricular tachycardia, usually require further treatment. HOME CARE INSTRUCTIONS   Avoid:  Caffeinated coffee, tea, soft drinks, diet pills, and energy drinks.  Chocolate.  Alcohol.  Stop smoking if you smoke.  Reduce your stress and anxiety. Things that can help you relax include:  A method of controlling things in your body, such as your heartbeats, with your mind (biofeedback).  Yoga.  Meditation.  Physical activity such as swimming, jogging, or walking.  Get plenty of rest and sleep. SEEK MEDICAL CARE IF:   You continue to have a fast or irregular heartbeat beyond 24 hours.  Your palpitations occur more often. SEEK IMMEDIATE MEDICAL CARE IF:  You have chest pain or shortness of breath.  You have a severe headache.  You feel dizzy or you faint. MAKE SURE YOU:  Understand these instructions.  Will watch your condition.  Will get help right away if you are not doing well or get worse.  Document Released: 12/20/1999 Document Revised: 12/27/2012 Document Reviewed: 02/20/2011 [facility] Patient Information 2015 [place], [place]. This information is not intended to replace advice given to you by your health care provider. Make sure you discuss any questions you have with your health care provider.

## 2013-11-24 ENCOUNTER — Telehealth: Payer: Self-pay | Admitting: Cardiovascular Disease

## 2013-11-24 NOTE — Telephone Encounter (Signed)
Left message on voice mail  To call back.

## 2013-11-24 NOTE — Telephone Encounter (Signed)
Pt felt dizzy,she checked her blood pressure. It is 173/101 and her heart rate 93.Now she is resting it is 163/96 and heart rate is 89. Please call to advise,she wants you to call asap.Patricia [name]

## 2013-11-24 NOTE — Telephone Encounter (Signed)
Spoke with patient She states after coming from bathroom - urinating- she felt - BRAXE,[id number] funny. B/p 173/101 p65, 168/92, 144/101 P65, (176/117 at [facility]) RN informed patient to continue to monitor over the weekend. call next week. Take metoprolol in morning and take amlodipine in the late morning or lunchtime. She verbalized understanding.

## 2013-11-24 NOTE — Telephone Encounter (Signed)
Pt is returning Sharon's call

## 2013-12-11 ENCOUNTER — Ambulatory Visit: Payer: Medicaid Other | Admitting: Cardiovascular Disease

## 2014-01-25 ENCOUNTER — Encounter (HOSPITAL_COMMUNITY): Payer: Self-pay

## 2014-01-25 ENCOUNTER — Emergency Department (HOSPITAL_COMMUNITY): Payer: Medicaid Other

## 2014-01-25 ENCOUNTER — Emergency Department (HOSPITAL_COMMUNITY)
Admission: EM | Admit: 2014-01-25 | Discharge: 2014-01-25 | Disposition: A | Discharge status: Home / Self Care | Payer: Medicaid Other | Attending: Emergency Medicine | Admitting: Emergency Medicine

## 2014-01-25 DIAGNOSIS — Z79899 Other long term (current) drug therapy: Secondary | ICD-10-CM | POA: Diagnosis not present

## 2014-01-25 DIAGNOSIS — Z90710 Acquired absence of both cervix and uterus: Secondary | ICD-10-CM | POA: Diagnosis not present

## 2014-01-25 DIAGNOSIS — E119 Type 2 diabetes mellitus without complications: Secondary | ICD-10-CM | POA: Insufficient documentation

## 2014-01-25 DIAGNOSIS — Z9889 Other specified postprocedural states: Secondary | ICD-10-CM | POA: Insufficient documentation

## 2014-01-25 DIAGNOSIS — R079 Chest pain, unspecified: Secondary | ICD-10-CM

## 2014-01-25 DIAGNOSIS — R1011 Right upper quadrant pain: Secondary | ICD-10-CM | POA: Diagnosis present

## 2014-01-25 DIAGNOSIS — R110 Nausea: Secondary | ICD-10-CM | POA: Insufficient documentation

## 2014-01-25 DIAGNOSIS — D649 Anemia, unspecified: Secondary | ICD-10-CM | POA: Diagnosis not present

## 2014-01-25 DIAGNOSIS — K219 Gastro-esophageal reflux disease without esophagitis: Secondary | ICD-10-CM | POA: Diagnosis not present

## 2014-01-25 DIAGNOSIS — Z7982 Long term (current) use of aspirin: Secondary | ICD-10-CM | POA: Insufficient documentation

## 2014-01-25 DIAGNOSIS — R109 Unspecified abdominal pain: Secondary | ICD-10-CM

## 2014-01-25 DIAGNOSIS — N183 Chronic kidney disease, stage 3 (moderate): Secondary | ICD-10-CM | POA: Diagnosis not present

## 2014-01-25 DIAGNOSIS — I129 Hypertensive chronic kidney disease with stage 1 through stage 4 chronic kidney disease, or unspecified chronic kidney disease: Secondary | ICD-10-CM | POA: Insufficient documentation

## 2014-01-25 DIAGNOSIS — E780 Pure hypercholesterolemia: Secondary | ICD-10-CM | POA: Insufficient documentation

## 2014-01-25 LAB — URINALYSIS, ROUTINE W REFLEX MICROSCOPIC
BILIRUBIN URINE: NEGATIVE
GLUCOSE, UA: 100 mg/dL — AB
KETONES UR: NEGATIVE mg/dL
LEUKOCYTES UA: NEGATIVE
NITRITE: NEGATIVE
Protein, ur: >300 mg/dL — AB
SPECIFIC GRAVITY, URINE: 1.012 (ref 1.005–1.030)
Urobilinogen, UA: 0.2 mg/dL (ref 0.0–1.0)
pH: 6 (ref 5.0–8.0)

## 2014-01-25 LAB — COMPREHENSIVE METABOLIC PANEL
ALBUMIN: 1.7 g/dL — AB (ref 3.5–5.2)
ALK PHOS: 46 U/L (ref 39–117)
ALT: 15 U/L (ref 0–35)
ANION GAP: 7 (ref 5–15)
AST: 29 U/L (ref 0–37)
BUN: 28 mg/dL — ABNORMAL HIGH (ref 6–23)
CHLORIDE: 101 meq/L (ref 96–112)
CO2: 30 mmol/L (ref 19–32)
Calcium: 8.1 mg/dL — ABNORMAL LOW (ref 8.4–10.5)
Creatinine, Ser: 1.72 mg/dL — ABNORMAL HIGH (ref 0.50–1.10)
GFR calc non Af Amer: 36 mL/min — ABNORMAL LOW (ref >90)
GFR, EST AFRICAN AMERICAN: 42 mL/min — AB (ref >90)
Glucose, Bld: 108 mg/dL — ABNORMAL HIGH (ref 70–99)
POTASSIUM: 2.8 mmol/L — AB (ref 3.5–5.1)
SODIUM: 138 mmol/L (ref 135–145)
Total Bilirubin: 0.2 mg/dL — ABNORMAL LOW (ref 0.3–1.2)
Total Protein: 5.1 g/dL — ABNORMAL LOW (ref 6.0–8.3)

## 2014-01-25 LAB — URINE MICROSCOPIC-ADD ON

## 2014-01-25 LAB — CBC WITH DIFFERENTIAL/PLATELET
BASOS ABS: 0 10*3/uL (ref 0.0–0.1)
BASOS PCT: 1 % (ref 0–1)
Eosinophils Absolute: 0.1 10*3/uL (ref 0.0–0.7)
Eosinophils Relative: 3 % (ref 0–5)
HEMATOCRIT: 33.8 % — AB (ref 36.0–46.0)
Hemoglobin: 10.9 g/dL — ABNORMAL LOW (ref 12.0–15.0)
LYMPHS PCT: 30 % (ref 12–46)
Lymphs Abs: 1.4 10*3/uL (ref 0.7–4.0)
MCH: 24.9 pg — AB (ref 26.0–34.0)
MCHC: 32.2 g/dL (ref 30.0–36.0)
MCV: 77.3 fL — AB (ref 78.0–100.0)
MONOS PCT: 8 % (ref 3–12)
Monocytes Absolute: 0.4 10*3/uL (ref 0.1–1.0)
NEUTROS ABS: 2.9 10*3/uL (ref 1.7–7.7)
Neutrophils Relative %: 60 % (ref 43–77)
Platelets: 391 10*3/uL (ref 150–400)
RBC: 4.37 MIL/uL (ref 3.87–5.11)
RDW: 14.4 % (ref 11.5–15.5)
WBC: 4.8 10*3/uL (ref 4.0–10.5)

## 2014-01-25 LAB — LIPASE, BLOOD: Lipase: 31 U/L (ref 11–59)

## 2014-01-25 LAB — I-STAT TROPONIN, ED: TROPONIN I, POC: 0 ng/mL (ref 0.00–0.08)

## 2014-01-25 MED ORDER — SODIUM CHLORIDE 0.9 % IV SOLN
INTRAVENOUS | Status: DC
  Administered 2014-01-25: 22:00:00 via INTRAVENOUS

## 2014-01-25 MED ORDER — HYDROCODONE-ACETAMINOPHEN 5-325 MG PO TABS: 1.0000 | ORAL_TABLET | Freq: Four times a day (QID) | ORAL | Status: DC | PRN

## 2014-01-25 MED ORDER — ONDANSETRON HCL 4 MG/2ML IJ SOLN
4.0000 mg | Freq: Once | INTRAMUSCULAR | Status: AC
  Administered 2014-01-25: 4 mg via INTRAVENOUS
  Filled 2014-01-25: qty 2

## 2014-01-25 MED ORDER — HYDROMORPHONE HCL 1 MG/ML IJ SOLN
1.0000 mg | Freq: Once | INTRAMUSCULAR | Status: AC
Start: 2014-01-25 — End: 2014-01-25
  Administered 2014-01-25: 1 mg via INTRAVENOUS
  Filled 2014-01-25: qty 1

## 2014-01-25 NOTE — ED Provider Notes (Signed)
CSN: [id number]     Arrival date & time 01/25/14  1846 History   First MD Initiated Contact with Patient 01/25/14 1945     Chief Complaint  Patient presents with  . Abdominal Pain     (Consider location/radiation/quality/duration/timing/severity/associated sxs/prior Treatment) Patient is a 39 y.o. female presenting with abdominal pain.  Abdominal Pain Associated symptoms: chest pain and nausea   Associated symptoms: no fever, no shortness of breath and no vomiting    patient with onset of right flank right upper quadrant abdominal pain 10:30 PM yesterday. The pain is 8-910 this been constant associated with nausea no vomiting no fevers. No diarrhea. No history of similar pain. Not made better or worse by anything. Pain did radiate at one point to the tip of her shoulder blade on the right side. No known history of any gallbladder problems. Patient does have a history of renal insufficiency and has renal disease being followed by nephrology apparently has protein in the urine at times.  Past Medical History  Diagnosis Date  . Hypertension   . Acid reflux   . High cholesterol   . Diabetes mellitus     "took me off my RX ~ 04/2013"  . Anemia   . History of blood transfusion     "related to low counts"  . Headache(784.0)     "related to chemo; sometimes weekly" (09/12/2013)  . Chronic kidney disease (CKD), stage III (moderate)   . 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 Ankle fracture surgery Right 1994  . Abdominal hysterectomy  2010    "laparoscopic"  . Cardiac catheterization  2000's   Family History  Problem Relation Age of Onset  . Hypertension Mother   . Thyroid disease Mother   . Coronary artery disease Father   . Hypertension Father   . Diabetes Father    History  Substance Use Topics  . Smoking status: Never Smoker   . Smokeless tobacco: Never Used  . Alcohol Use: No   OB  History    Gravida Para Term Preterm AB TAB SAB Ectopic Multiple Living   3        3      Review of Systems  Constitutional: Negative for fever.  HENT: Negative for congestion.   Eyes: Negative for visual disturbance.  Respiratory: Negative for shortness of breath.   Cardiovascular: Positive for chest pain.  Gastrointestinal: Positive for nausea and abdominal pain. Negative for vomiting.  Genitourinary: Positive for flank pain.  Musculoskeletal: Positive for back pain.  Skin: Negative for rash.  Neurological: Negative for headaches.  Hematological: Does not bruise/bleed easily.  Psychiatric/Behavioral: Negative for confusion.      Allergies  Nsaids  Home Medications   Prior to Admission medications   Medication Sig Start Date End Date Taking? Authorizing Provider  amLODipine (NORVASC) 10 MG tablet Take 10 mg by mouth daily.   Yes Historical Provider, MD  aspirin 81 MG chewable tablet Chew 1 tablet (81 mg total) by mouth daily. 10/03/13  Yes Eugenie Filler, MD  atorvastatin (LIPITOR) 20 MG tablet Take 20 mg by mouth at bedtime.   Yes Historical Provider, MD  LORazepam (ATIVAN) 0.5 MG tablet Take 0.5 mg by mouth every 6 (six) hours as needed for anxiety.   Yes Historical Provider, MD  metoprolol tartrate (LOPRESSOR) 25 MG tablet Take 1 tablet (25 mg total) by mouth 2 (two) times  daily. 10/03/13  Yes Eugenie Filler, MD  Multiple Vitamin (MULTIVITAMIN WITH MINERALS) TABS tablet Take 1 tablet by mouth daily.   Yes Historical Provider, MD  omeprazole (PRILOSEC) 40 MG capsule Take 40 mg by mouth daily.     Yes Historical Provider, MD  promethazine (PHENERGAN) 25 MG tablet Take 1 tablet (25 mg total) by mouth every 6 (six) hours as needed for nausea or vomiting. 09/14/13  Yes Theodis Blaze, MD  vitamin C (ASCORBIC ACID) 500 MG tablet Take 500 mg by mouth daily.   Yes Historical Provider, MD  HYDROcodone-acetaminophen (NORCO/VICODIN) 5-325 MG per tablet Take 1-2 tablets by mouth every  6 (six) hours as needed for moderate pain. 01/25/14   Fredia Sorrow, MD  nitroGLYCERIN (NITROSTAT) 0.4 MG SL tablet Place 1 tablet (0.4 mg total) under the tongue every 5 (five) minutes as needed for chest pain. 10/03/13   Eugenie Filler, MD  oxyCODONE-acetaminophen (PERCOCET/ROXICET) 5-325 MG per tablet Take 1-2 tablets by mouth every 4 (four) hours as needed for moderate pain. Patient not taking: Reported on 11/21/2013 09/14/13   Theodis Blaze, MD   BP 131/70 mmHg  Pulse 56  Temp(Src) 98.7 F (37.1 C) (Oral)  Resp 19  SpO2 99% Physical Exam  Constitutional: She is oriented to person, place, and time. She appears well-developed and well-nourished. No distress.  HENT:  Head: Normocephalic and atraumatic.  Mouth/Throat: Oropharynx is clear and moist.  Eyes: Conjunctivae and EOM are normal. Pupils are equal, round, and reactive to light.  Neck: Normal range of motion.  Cardiovascular: Normal rate, regular rhythm and normal heart sounds.   No murmur heard. Pulmonary/Chest: Effort normal and breath sounds normal. No respiratory distress.  Abdominal: Soft. Bowel sounds are normal. There is tenderness.  Musculoskeletal: Normal range of motion. She exhibits no edema.  Neurological: She is alert and oriented to person, place, and time. No cranial nerve deficit. She exhibits normal muscle tone. Coordination normal.  Skin: Skin is warm. No rash noted.  Nursing note and vitals reviewed.   ED Course  Procedures (including critical care time) Labs Review Labs Reviewed  CBC WITH DIFFERENTIAL - Abnormal; Notable for the following:    Hemoglobin 10.9 (*)    HCT 33.8 (*)    MCV 77.3 (*)    MCH 24.9 (*)    All other components within normal limits  COMPREHENSIVE METABOLIC PANEL - Abnormal; Notable for the following:    Potassium 2.8 (*)    Glucose, Bld 108 (*)    BUN 28 (*)    Creatinine, Ser 1.72 (*)    Calcium 8.1 (*)    Total Protein 5.1 (*)    Albumin 1.7 (*)    Total Bilirubin 0.2  (*)    GFR calc non Af Amer 36 (*)    GFR calc Af Amer 42 (*)    All other components within normal limits  URINALYSIS, ROUTINE W REFLEX MICROSCOPIC - Abnormal; Notable for the following:    Glucose, UA 100 (*)    Hgb urine dipstick MODERATE (*)    Protein, ur >300 (*)    All other components within normal limits  URINE MICROSCOPIC-ADD ON - Abnormal; Notable for the following:    Bacteria, UA MANY (*)    All other components within normal limits  LIPASE, BLOOD  I-STAT TROPOININ, ED   Results for orders placed or performed during the hospital encounter of 01/25/14  CBC with Differential  Result Value Ref Range   WBC  4.8 4.0 - 10.5 K/uL   RBC 4.37 3.87 - 5.11 MIL/uL   Hemoglobin 10.9 (L) 12.0 - 15.0 g/dL   HCT 33.8 (L) 36.0 - 46.0 %   MCV 77.3 (L) 78.0 - 100.0 fL   MCH 24.9 (L) 26.0 - 34.0 pg   MCHC 32.2 30.0 - 36.0 g/dL   RDW 14.4 11.5 - 15.5 %   Platelets 391 150 - 400 K/uL   Neutrophils Relative % 60 43 - 77 %   Neutro Abs 2.9 1.7 - 7.7 K/uL   Lymphocytes Relative 30 12 - 46 %   Lymphs Abs 1.4 0.7 - 4.0 K/uL   Monocytes Relative 8 3 - 12 %   Monocytes Absolute 0.4 0.1 - 1.0 K/uL   Eosinophils Relative 3 0 - 5 %   Eosinophils Absolute 0.1 0.0 - 0.7 K/uL   Basophils Relative 1 0 - 1 %   Basophils Absolute 0.0 0.0 - 0.1 K/uL  Comprehensive metabolic panel  Result Value Ref Range   Sodium 138 135 - 145 mmol/L   Potassium 2.8 (L) 3.5 - 5.1 mmol/L   Chloride 101 96 - 112 mEq/L   CO2 30 19 - 32 mmol/L   Glucose, Bld 108 (H) 70 - 99 mg/dL   BUN 28 (H) 6 - 23 mg/dL   Creatinine, Ser 1.72 (H) 0.50 - 1.10 mg/dL   Calcium 8.1 (L) 8.4 - 10.5 mg/dL   Total Protein 5.1 (L) 6.0 - 8.3 g/dL   Albumin 1.7 (L) 3.5 - 5.2 g/dL   AST 29 0 - 37 U/L   ALT 15 0 - 35 U/L   Alkaline Phosphatase 46 39 - 117 U/L   Total Bilirubin 0.2 (L) 0.3 - 1.2 mg/dL   GFR calc non Af Amer 36 (L) >90 mL/min   GFR calc Af Amer 42 (L) >90 mL/min   Anion gap 7 5 - 15  Lipase, blood  Result Value Ref Range    Lipase 31 11 - 59 U/L  Urinalysis, Routine w reflex microscopic  Result Value Ref Range   Color, Urine YELLOW YELLOW   APPearance CLEAR CLEAR   Specific Gravity, Urine 1.012 1.005 - 1.030   pH 6.0 5.0 - 8.0   Glucose, UA 100 (A) NEGATIVE mg/dL   Hgb urine dipstick MODERATE (A) NEGATIVE   Bilirubin Urine NEGATIVE NEGATIVE   Ketones, ur NEGATIVE NEGATIVE mg/dL   Protein, ur >300 (A) NEGATIVE mg/dL   Urobilinogen, UA 0.2 0.0 - 1.0 mg/dL   Nitrite NEGATIVE NEGATIVE   Leukocytes, UA NEGATIVE NEGATIVE  Urine microscopic-add on  Result Value Ref Range   Squamous Epithelial / LPF RARE RARE   WBC, UA 0-2 <3 WBC/hpf   RBC / HPF 3-6 <3 RBC/hpf   Bacteria, UA MANY (A) RARE  I-stat troponin, ED (only if pt is 41 y.o. or older & pain is above umbilicus) - do not order at [facility]  Result Value Ref Range   Troponin i, poc 0.00 0.00 - 0.08 ng/mL   Comment 3             Imaging Review Dg Chest 2 View  01/25/2014   CLINICAL DATA:  Right-sided chest pain, abdominal pain  EXAM: CHEST  2 VIEW  COMPARISON:  11/21/2013  FINDINGS: There is no focal parenchymal opacity, pleural effusion, or pneumothorax. The heart and mediastinal contours are unremarkable.  The osseous structures are unremarkable.  IMPRESSION: No active cardiopulmonary disease.   Electronically Signed   By: Elbert Ewings  Patel   On: 01/25/2014 22:03   US Abdomen Complete  01/25/2014   CLINICAL DATA:  Abdominal pain. History of hypertension and diabetes mellitus and kidney disease.  EXAM: ULTRASOUND ABDOMEN COMPLETE  COMPARISON:  Renal ultrasound, 09/30/2013.  CT, 03/24/2012.  FINDINGS: Gallbladder: No gallstones or wall thickening visualized. No sonographic Murphy sign noted.  Common bile duct: Diameter: 5.4 mm  Liver: No focal lesion identified. Within normal limits in parenchymal echogenicity.  IVC: No abnormality visualized.  Pancreas: Visualized portion unremarkable.  Spleen: Size and appearance within normal limits.  Right Kidney: Length: 13.6  cm. Borderline increased parenchymal echogenicity. No mass, stone or hydronephrosis.  Left Kidney: Length: 13.8 cm. Borderline increased parenchymal echogenicity. No mass, stone or hydronephrosis  Abdominal aorta: No aneurysm visualized.  Other findings: None.  IMPRESSION: 1. No acute findings. 2. Borderline increased renal parenchymal echogenicity suggests medical renal disease similar to the prior renal ultrasound. No hydronephrosis. 3. No other abnormalities.   Electronically Signed   By: Lajean Manes M.D.   On: 01/25/2014 22:47     EKG Interpretation   Date/Time:  Thursday January 25 2014 18:49:48 EST Ventricular Rate:  70 PR Interval:  150 QRS Duration: 90 QT Interval:  414 QTC Calculation: 447 R Axis:   43 Text Interpretation:  Normal sinus rhythm Cannot rule out Anterior infarct  , age undetermined Abnormal ECG No significant change since last tracing  Confirmed by Tonda Wiederhold  MD, Keyana Guevara [phone]) on 01/25/2014 8:57:20 PM      MDM   Final diagnoses:  Abdominal pain  Chest pain    Patient with complaint of right upper quadrant right flank pain that started about 10:30 PM yesterday. Symptoms seem to be consistent with the possibility of gallbladder disease since it radiated to the right shoulder blade. Associated with nausea but no vomiting no diarrhea no fevers. Patient without a history of similar pain.  Workup here without any significant findings. Chest pain workup chest x-ray negative troponin negative EKG without acute changes. Urinalysis negative for urinary tract infection. Liver function tests without any sniffing abnormalities. Patient known to have renal insufficiency but creatinine is improved at 1.7 compared to numbers that she's had about 2 in the past. No leukocytosis no significant anemia but does have a mild anemia.  Patient instructed to follow-up with her regular doctor. Possible she could have a dysfunctional gallbladder scan may be needed if symptoms persist or  reoccur. Patient stable for discharge home.    Fredia Sorrow, MD 01/25/14 [phone]

## 2014-01-25 NOTE — Discharge Instructions (Signed)
Recommend following up with your regular doctor in the next few days. Take hydrocodone as needed for pain. Take the Phenergan you have at home as needed for nausea and vomiting area and return for any new or worse symptoms. Lab results from today provided to you. Ultrasound without any significant findings. Chest x-ray was negative.

## 2014-01-25 NOTE — ED Notes (Signed)
Pt reports RUQ pain with radiation to back that started today. Associated with nausea. Denies diarrhea or emesis. Area tender to palpation. Denies fever/chills. Denies SOB.

## 2014-01-29 ENCOUNTER — Telehealth: Payer: Self-pay | Admitting: Cardiovascular Disease

## 2014-01-29 ENCOUNTER — Ambulatory Visit: Payer: Self-pay | Admitting: Cardiovascular Disease

## 2014-01-31 NOTE — Telephone Encounter (Signed)
Closed encounter °

## 2014-02-19 ENCOUNTER — Emergency Department (HOSPITAL_COMMUNITY): Payer: Medicaid Other

## 2014-02-19 ENCOUNTER — Encounter (HOSPITAL_COMMUNITY): Payer: Self-pay | Admitting: Emergency Medicine

## 2014-02-19 ENCOUNTER — Observation Stay (HOSPITAL_COMMUNITY)
Admission: EM | Admit: 2014-02-19 | Discharge: 2014-02-21 | Disposition: A | Discharge status: Home / Self Care | Payer: Medicaid Other | Attending: Internal Medicine | Admitting: Internal Medicine

## 2014-02-19 DIAGNOSIS — Z87448 Personal history of other diseases of urinary system: Secondary | ICD-10-CM | POA: Insufficient documentation

## 2014-02-19 DIAGNOSIS — R110 Nausea: Secondary | ICD-10-CM | POA: Insufficient documentation

## 2014-02-19 DIAGNOSIS — E780 Pure hypercholesterolemia: Secondary | ICD-10-CM | POA: Insufficient documentation

## 2014-02-19 DIAGNOSIS — E119 Type 2 diabetes mellitus without complications: Secondary | ICD-10-CM

## 2014-02-19 DIAGNOSIS — N183 Chronic kidney disease, stage 3 unspecified: Secondary | ICD-10-CM | POA: Diagnosis present

## 2014-02-19 DIAGNOSIS — Z9889 Other specified postprocedural states: Secondary | ICD-10-CM | POA: Insufficient documentation

## 2014-02-19 DIAGNOSIS — K219 Gastro-esophageal reflux disease without esophagitis: Secondary | ICD-10-CM | POA: Diagnosis not present

## 2014-02-19 DIAGNOSIS — D649 Anemia, unspecified: Secondary | ICD-10-CM | POA: Insufficient documentation

## 2014-02-19 DIAGNOSIS — Z79899 Other long term (current) drug therapy: Secondary | ICD-10-CM | POA: Diagnosis not present

## 2014-02-19 DIAGNOSIS — Z7982 Long term (current) use of aspirin: Secondary | ICD-10-CM | POA: Diagnosis not present

## 2014-02-19 DIAGNOSIS — R0602 Shortness of breath: Secondary | ICD-10-CM | POA: Diagnosis not present

## 2014-02-19 DIAGNOSIS — R079 Chest pain, unspecified: Secondary | ICD-10-CM | POA: Diagnosis present

## 2014-02-19 DIAGNOSIS — I129 Hypertensive chronic kidney disease with stage 1 through stage 4 chronic kidney disease, or unspecified chronic kidney disease: Secondary | ICD-10-CM | POA: Diagnosis not present

## 2014-02-19 DIAGNOSIS — E876 Hypokalemia: Secondary | ICD-10-CM

## 2014-02-19 DIAGNOSIS — R002 Palpitations: Secondary | ICD-10-CM | POA: Diagnosis not present

## 2014-02-19 DIAGNOSIS — I10 Essential (primary) hypertension: Secondary | ICD-10-CM | POA: Diagnosis present

## 2014-02-19 HISTORY — DX: Iron deficiency anemia, unspecified: D50.9

## 2014-02-19 LAB — CBC
HEMATOCRIT: 34 % — AB (ref 36.0–46.0)
HEMOGLOBIN: 11.1 g/dL — AB (ref 12.0–15.0)
MCH: 24.3 pg — ABNORMAL LOW (ref 26.0–34.0)
MCHC: 32.6 g/dL (ref 30.0–36.0)
MCV: 74.6 fL — ABNORMAL LOW (ref 78.0–100.0)
Platelets: 319 10*3/uL (ref 150–400)
RBC: 4.56 MIL/uL (ref 3.87–5.11)
RDW: 14.6 % (ref 11.5–15.5)
WBC: 5.1 10*3/uL (ref 4.0–10.5)

## 2014-02-19 LAB — BASIC METABOLIC PANEL
Anion gap: 9 (ref 5–15)
BUN: 27 mg/dL — ABNORMAL HIGH (ref 6–23)
CO2: 26 mmol/L (ref 19–32)
Calcium: 8 mg/dL — ABNORMAL LOW (ref 8.4–10.5)
Chloride: 102 mmol/L (ref 96–112)
Creatinine, Ser: 2.2 mg/dL — ABNORMAL HIGH (ref 0.50–1.10)
GFR, EST AFRICAN AMERICAN: 31 mL/min — AB (ref >90)
GFR, EST NON AFRICAN AMERICAN: 27 mL/min — AB (ref >90)
GLUCOSE: 129 mg/dL — AB (ref 70–99)
POTASSIUM: 2.5 mmol/L — AB (ref 3.5–5.1)
SODIUM: 137 mmol/L (ref 135–145)

## 2014-02-19 LAB — I-STAT TROPONIN, ED: Troponin i, poc: 0 ng/mL (ref 0.00–0.08)

## 2014-02-19 LAB — BRAIN NATRIURETIC PEPTIDE: B Natriuretic Peptide: 69.9 pg/mL (ref 0.0–100.0)

## 2014-02-19 MED ORDER — POTASSIUM CHLORIDE CRYS ER 20 MEQ PO TBCR
40.0000 meq | EXTENDED_RELEASE_TABLET | Freq: Once | ORAL | Status: DC
  Filled 2014-02-19: qty 2

## 2014-02-19 MED ORDER — POTASSIUM CHLORIDE 10 MEQ/100ML IV SOLN
10.0000 meq | Freq: Once | INTRAVENOUS | Status: DC
  Administered 2014-02-19: 10 meq via INTRAVENOUS
  Filled 2014-02-19: qty 100

## 2014-02-19 NOTE — ED Notes (Signed)
Pt. reports central chest tightness/heaviness with exertional dyspnea , dizziness, lightheaded , nausea and blurred vision onset today .

## 2014-02-19 NOTE — ED Notes (Addendum)
MD informed of potassium

## 2014-02-19 NOTE — ED Notes (Signed)
Patient transported to X-ray 

## 2014-02-19 NOTE — ED Notes (Signed)
Admitting MD at bedside.

## 2014-02-19 NOTE — ED Provider Notes (Signed)
CSN: [id number]     Arrival date & time 02/19/14  2123 History   First MD Initiated Contact with Patient 02/19/14 2132     Chief Complaint  Patient presents with  . Chest Pain  . Dizziness     (Consider location/radiation/quality/duration/timing/severity/associated sxs/prior Treatment) Patient is a 39 y.o. female presenting with chest pain. The history is provided by the patient. No language interpreter was used.  Chest Pain Pain location:  L chest Pain quality: pressure   Pain radiates to the back: no   Pain severity:  Moderate Associated symptoms: dizziness, nausea, palpitations, shortness of breath and weakness   Associated symptoms: no abdominal pain, no fever and not vomiting   Associated symptoms comment:  The patient presents to the emergency department with complaint of left chest pressure, mild SOB, episodic palpitations, lightheadedness and weakness. No syncope.  Chest pain - pressure like in nature. She reports chronic recurrent pain in her chest, hospitalized in 09/2013 for same;  Palpitations - feels her heart beat race periodically, not associated with chest pressure or worsening of discomfort. She becomes lightheaded, dizzy and weak, feels she has no energy and this lasts for most of the day.  No vomiting but she endorses nausea. No fever, cough, headache.    Past Medical History  Diagnosis Date  . Hypertension   . Acid reflux   . High cholesterol   . Diabetes mellitus     "took me off my RX ~ 04/2013"  . Anemia   . History of blood transfusion     "related to low counts"  . Headache(784.0)     "related to chemo; sometimes weekly" (09/12/2013)  . Chronic kidney disease (CKD), stage III (moderate)   . 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 Ankle fracture surgery Right 1994  . Abdominal hysterectomy  2010    "laparoscopic"  . Cardiac catheterization  2000's   Family  History  Problem Relation Age of Onset  . Hypertension Mother   . Thyroid disease Mother   . Coronary artery disease Father   . Hypertension Father   . Diabetes Father    History  Substance Use Topics  . Smoking status: Never Smoker   . Smokeless tobacco: Never Used  . Alcohol Use: No   OB History    Gravida Para Term Preterm AB TAB SAB Ectopic Multiple Living   3        3      Review of Systems  Constitutional: Negative for fever and chills.  Respiratory: Positive for shortness of breath.   Cardiovascular: Positive for chest pain and palpitations. Negative for leg swelling.  Gastrointestinal: Positive for nausea. Negative for vomiting and abdominal pain.  Musculoskeletal: Negative.   Skin: Negative.   Neurological: Positive for dizziness, weakness and light-headedness. Negative for syncope.      Allergies  Nsaids  Home Medications   Prior to Admission medications   Medication Sig Start Date End Date Taking? Authorizing Provider  amLODipine (NORVASC) 10 MG tablet Take 10 mg by mouth daily.   Yes Historical Provider, MD  aspirin 81 MG chewable tablet Chew 1 tablet (81 mg total) by mouth daily. 10/03/13  Yes Eugenie Filler, MD  atorvastatin (LIPITOR) 20 MG tablet Take 20 mg by mouth at bedtime.   Yes Historical Provider, MD  HYDROcodone-acetaminophen (NORCO/VICODIN) 5-325 MG per tablet Take 1-2 tablets by mouth every 6 (six)  hours as needed for moderate pain. 01/25/14  Yes Fredia Sorrow, MD  metolazone (ZAROXOLYN) 5 MG tablet Take 5 mg by mouth daily. 02/15/14  Yes Historical Provider, MD  metoprolol tartrate (LOPRESSOR) 25 MG tablet Take 1 tablet (25 mg total) by mouth 2 (two) times daily. 10/03/13  Yes Eugenie Filler, MD  Multiple Vitamin (MULTIVITAMIN WITH MINERALS) TABS tablet Take 1 tablet by mouth daily.   Yes Historical Provider, MD  omeprazole (PRILOSEC) 40 MG capsule Take 40 mg by mouth daily.     Yes Historical Provider, MD  torsemide (DEMADEX) 20 MG tablet  Take 80 mg by mouth daily. 09/07/13  Yes Historical Provider, MD  vitamin C (ASCORBIC ACID) 500 MG tablet Take 500 mg by mouth daily.   Yes Historical Provider, MD  nitroGLYCERIN (NITROSTAT) 0.4 MG SL tablet Place 1 tablet (0.4 mg total) under the tongue every 5 (five) minutes as needed for chest pain. 10/03/13   Eugenie Filler, MD  oxyCODONE-acetaminophen (PERCOCET/ROXICET) 5-325 MG per tablet Take 1-2 tablets by mouth every 4 (four) hours as needed for moderate pain. Patient not taking: Reported on 11/21/2013 09/14/13   Theodis Blaze, MD  promethazine (PHENERGAN) 25 MG tablet Take 1 tablet (25 mg total) by mouth every 6 (six) hours as needed for nausea or vomiting. 09/14/13   Theodis Blaze, MD   BP 127/67 mmHg  Pulse 79  Temp(Src) 98.7 F (37.1 C)  Resp 23  SpO2 100% Physical Exam  Constitutional: She is oriented to person, place, and time. She appears well-developed and well-nourished.  HENT:  Head: Normocephalic.  Eyes: Conjunctivae are normal.  Neck: Normal range of motion. Neck supple.  Cardiovascular: Normal rate and regular rhythm.   No murmur heard. Pulmonary/Chest: Effort normal and breath sounds normal. She has no wheezes. She has no rales. She exhibits no tenderness.  Abdominal: Soft. Bowel sounds are normal. There is no tenderness. There is no rebound and no guarding.  Musculoskeletal: Normal range of motion.  Neurological: She is alert and oriented to person, place, and time.  Skin: Skin is warm and dry. No rash noted.  Psychiatric: She has a normal mood and affect.    ED Course  Procedures (including critical care time) Labs Review Labs Reviewed  CBC - Abnormal; Notable for the following:    Hemoglobin 11.1 (*)    HCT 34.0 (*)    MCV 74.6 (*)    MCH 24.3 (*)    All other components within normal limits  BASIC METABOLIC PANEL - Abnormal; Notable for the following:    Potassium 2.5 (*)    Glucose, Bld 129 (*)    BUN 27 (*)    Creatinine, Ser 2.20 (*)    Calcium  8.0 (*)    GFR calc non Af Amer 27 (*)    GFR calc Af Amer 31 (*)    All other components within normal limits  URINE CULTURE  BRAIN NATRIURETIC PEPTIDE  URINALYSIS, ROUTINE W REFLEX MICROSCOPIC  I-STAT TROPOININ, ED    Imaging Review Dg Chest 2 View  02/19/2014   CLINICAL DATA:  Sharp chest pain today  EXAM: CHEST  2 VIEW  COMPARISON:  January 25, 2014  FINDINGS: The heart size and mediastinal contours are within normal limits. There is no focal infiltrate, pulmonary edema, or pleural effusion. The visualized skeletal structures are stable. There is scoliosis of spine.  IMPRESSION: No active cardiopulmonary disease.   Electronically Signed   By: Mallie Darting.D.  On: 02/19/2014 21:53     EKG Interpretation   Date/Time:  Monday February 19 2014 21:26:14 EST Ventricular Rate:  84 PR Interval:  146 QRS Duration: 92 QT Interval:  388 QTC Calculation: 458 R Axis:   62 Text Interpretation:  Normal sinus rhythm Nonspecific T wave abnormality  Abnormal ECG No significant change since last tracing Confirmed by  Winfred Leeds  MD, SAM [phone]) on 02/19/2014 9:51:48 PM      MDM   Final diagnoses:  Chest pain  SOB (shortness of breath)    1. Chest pain 2. Palpitation 3. Hypokalemia  Chart reviewed:   Adm 09/2013: chest pain Neg VQ scan LE dopplers neg Noted to have symptomatic SVT during admission. Stress myoview 9/26 neg for ischemia, EF 63% Cardiac cath and cardiac CT unable to be obtained secondary renal function Was placed on Lopressor and scheduled for outpatient monitor - has not had this yet   She continues to feel weak. Potassium significantly low at 2.5. EKG with diffuse changes - ?hypokalemia. Doubt ischemia with negative myoview end of last year. Negative VQ/dopplers at that time as well.   Dr. Winfred Leeds has seen and evaluated the patient and feels admission/observation for potassium supplementation is appropriate. Discussed with Dr. Humphrey Rolls who accepts for  admission.  Dewaine Oats, PA-C 02/19/14 [place], MD 02/20/14 [phone]

## 2014-02-19 NOTE — ED Notes (Signed)
Dr. Jacubowitz at bedside 

## 2014-02-19 NOTE — H&P (Signed)
[facility] History and Physical  Camey Edell JIR:[id number] DOB: 1975-09-11 DOA: 02/19/2014  Referring physician: Orlie Dakin, MD PCP: Tereasa Coop, PA-C   Chief Complaint: Chest pain  HPI: Patricia [name] is a 39 y.o. female presents with chest pain. Patient states that the pain started earlier today. She states that she was not doing anything at the time. Location was left side and there was some radiation down her left arm. She had some shortness of breath associated with this. She states that she did have some headaches and also had some dizziness with blurry vision. She had no belly pain. She states that she did not pass out. She had some nausea. No vomiting noted. She states that she has no fevers or chills noted. In the ED she was noted to have a low potassium level and is being admitted for this. Currently she has no chest pain but feels heaviness. She also does have a history of reflux and is on prilosec for this. She states that current symptoms were on an empty stomach.   Review of Systems:  Complete 12 point ROS performed and is negative other than HPI  Past Medical History  Diagnosis Date  . Hypertension   . Acid reflux   . High cholesterol   . Diabetes mellitus     "took me off my RX ~ 04/2013"  . Anemia   . History of blood transfusion     "related to low counts"  . Headache(784.0)     "related to chemo; sometimes weekly" (09/12/2013)  . Chronic kidney disease (CKD), stage III (moderate)   . 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 Ankle fracture surgery Right 1994  . Abdominal hysterectomy  2010    "laparoscopic"  . Cardiac catheterization  2000's   Social History:  reports that she has never smoked. She has never used smokeless tobacco. She reports that she does not drink alcohol or use illicit drugs.  Allergies  Allergen Reactions  . Nsaids Other (See  Comments)    Cannot take due to Kidney disease    Family History  Problem Relation Age of Onset  . Hypertension Mother   . Thyroid disease Mother   . Coronary artery disease Father   . Hypertension Father   . Diabetes Father      Prior to Admission medications   Medication Sig Start Date End Date Taking? Authorizing Provider  amLODipine (NORVASC) 10 MG tablet Take 10 mg by mouth daily.   Yes Historical Provider, MD  aspirin 81 MG chewable tablet Chew 1 tablet (81 mg total) by mouth daily. 10/03/13  Yes Eugenie Filler, MD  atorvastatin (LIPITOR) 20 MG tablet Take 20 mg by mouth at bedtime.   Yes Historical Provider, MD  HYDROcodone-acetaminophen (NORCO/VICODIN) 5-325 MG per tablet Take 1-2 tablets by mouth every 6 (six) hours as needed for moderate pain. 01/25/14  Yes Fredia Sorrow, MD  metolazone (ZAROXOLYN) 5 MG tablet Take 5 mg by mouth daily. 02/15/14  Yes Historical Provider, MD  metoprolol tartrate (LOPRESSOR) 25 MG tablet Take 1 tablet (25 mg total) by mouth 2 (two) times daily. 10/03/13  Yes Eugenie Filler, MD  Multiple Vitamin (MULTIVITAMIN WITH MINERALS) TABS tablet Take 1 tablet by mouth daily.   Yes Historical Provider, MD  omeprazole (PRILOSEC) 40 MG capsule Take 40 mg by mouth daily.     Yes Historical Provider, MD  torsemide (DEMADEX) 20 MG tablet Take 80 mg by mouth daily. 09/07/13  Yes Historical Provider, MD  vitamin C (ASCORBIC ACID) 500 MG tablet Take 500 mg by mouth daily.   Yes Historical Provider, MD  nitroGLYCERIN (NITROSTAT) 0.4 MG SL tablet Place 1 tablet (0.4 mg total) under the tongue every 5 (five) minutes as needed for chest pain. 10/03/13   Eugenie Filler, MD  oxyCODONE-acetaminophen (PERCOCET/ROXICET) 5-325 MG per tablet Take 1-2 tablets by mouth every 4 (four) hours as needed for moderate pain. Patient not taking: Reported on 11/21/2013 09/14/13   Theodis Blaze, MD  promethazine (PHENERGAN) 25 MG tablet Take 1 tablet (25 mg total) by mouth every 6 (six)  hours as needed for nausea or vomiting. 09/14/13   Theodis Blaze, MD   Physical Exam: Filed Vitals:   02/19/14 2215 02/19/14 2220 02/19/14 2230 02/19/14 2301  BP: 152/82 152/82 127/67 158/92  Pulse: 73 75 79 59  Temp:      Resp: $Remo'17 18 23 21  'MLvWR$ SpO2: 100% 100% 100% 100%    Wt Readings from Last 3 Encounters:  11/20/13 108.863 kg (240 lb)  10/26/13 111.449 kg (245 lb 11.2 oz)  10/03/13 110.088 kg (242 lb 11.2 oz)    General:  Appears calm and comfortable Eyes: PERRL, normal lids, irises & conjunctiva ENT: grossly normal hearing, lips & tongue Neck: no LAD, masses or thyromegaly Cardiovascular: RRR, no m/r/g. No LE edema. Respiratory: CTA bilaterally, no w/r/r. Normal respiratory effort. Abdomen: soft, ntnd Skin: no rash or induration seen on limited exam Musculoskeletal: grossly normal tone BUE/BLE Psychiatric: grossly normal mood and affect, speech fluent and appropriate Neurologic: grossly non-focal.          Labs on Admission:  Basic Metabolic Panel:  Recent Labs Lab 02/19/14 2136  NA 137  K 2.5*  CL 102  CO2 26  GLUCOSE 129*  BUN 27*  CREATININE 2.20*  CALCIUM 8.0*   Liver Function Tests: No results for input(s): AST, ALT, ALKPHOS, BILITOT, PROT, ALBUMIN in the last 168 hours. No results for input(s): LIPASE, AMYLASE in the last 168 hours. No results for input(s): AMMONIA in the last 168 hours. CBC:  Recent Labs Lab 02/19/14 2136  WBC 5.1  HGB 11.1*  HCT 34.0*  MCV 74.6*  PLT 319   Cardiac Enzymes: No results for input(s): CKTOTAL, CKMB, CKMBINDEX, TROPONINI in the last 168 hours.  BNP (last 3 results)  Recent Labs  02/19/14 2136  BNP 69.9    ProBNP (last 3 results)  Recent Labs  06/29/13 1426 08/21/13 0739  PROBNP 313.9* 358.8*    CBG: No results for input(s): GLUCAP in the last 168 hours.  Radiological Exams on Admission: Dg Chest 2 View  02/19/2014   CLINICAL DATA:  Sharp chest pain today  EXAM: CHEST  2 VIEW  COMPARISON:   January 25, 2014  FINDINGS: The heart size and mediastinal contours are within normal limits. There is no focal infiltrate, pulmonary edema, or pleural effusion. The visualized skeletal structures are stable. There is scoliosis of spine.  IMPRESSION: No active cardiopulmonary disease.   Electronically Signed   By: Abelardo Diesel M.D.   On: 02/19/2014 21:53      Assessment/Plan Principal Problem:   Hypokalemia Active Problems:   HTN (hypertension)   DM (diabetes mellitus)   CKD stage 3 [facility] at [facility]   Chest pain   1. Hypokalemia -primary reason for admission -will replete with IV KCL -follow up labs in am -Also  check Magnesium  2. Chest Pain/Tightness -will check serial enzymes -currently pain free  3. HTN -continue with home medications -monitor pressures  4. CKD III -renal function slightly worse than from last labs -will hydrate gently  5. DM Type 2 -she states that she is not a diabetic -will check A1C -monitor FSBS  6. Anemia -check iron studies   Code Status: Full Code (must indicate code status--if unknown or must be presumed, indicate so) DVT Prophylaxis:Heparin Family Communication: Daughter (indicate person spoken with, if applicable, with phone number if by telephone) Disposition Plan: Home (indicate anticipated LOS)  Time spent: 51min  KHAN,SAADAT A [facility] Pager [phone]

## 2014-02-19 NOTE — ED Provider Notes (Signed)
Complains of anterior chest pain described as pressure onset 12 noon today constant. Not made worse with walking or exertion. She also reports that she had episode of palpitations with fast heartbeat and lightheadedness this afternoon which has since resolved. Patient had Myoview test September 2015 which was normal. On exam no distress lungs clear auscultation heart regular rate and rhythm no murmurs or rubs abdomen obese nontender extremities without edema and patient noted to be markedly hypokalemic with potassium of 2.5. Strongly doubt cardiac ischemia given negative Myoview less than 6 months ago.  Orlie Dakin, MD 02/19/14 [phone]

## 2014-02-20 ENCOUNTER — Encounter (HOSPITAL_COMMUNITY): Payer: Self-pay | Admitting: General Practice

## 2014-02-20 DIAGNOSIS — R0602 Shortness of breath: Secondary | ICD-10-CM | POA: Diagnosis not present

## 2014-02-20 DIAGNOSIS — R079 Chest pain, unspecified: Secondary | ICD-10-CM | POA: Diagnosis not present

## 2014-02-20 DIAGNOSIS — R072 Precordial pain: Secondary | ICD-10-CM

## 2014-02-20 DIAGNOSIS — K219 Gastro-esophageal reflux disease without esophagitis: Secondary | ICD-10-CM | POA: Diagnosis not present

## 2014-02-20 DIAGNOSIS — E119 Type 2 diabetes mellitus without complications: Secondary | ICD-10-CM | POA: Diagnosis not present

## 2014-02-20 LAB — BASIC METABOLIC PANEL
Anion gap: 3 — ABNORMAL LOW (ref 5–15)
Anion gap: 7 (ref 5–15)
BUN: 26 mg/dL — ABNORMAL HIGH (ref 6–23)
BUN: 23 mg/dL (ref 6–23)
CO2: 29 mmol/L (ref 19–32)
CO2: 27 mmol/L (ref 19–32)
Calcium: 7.6 mg/dL — ABNORMAL LOW (ref 8.4–10.5)
Calcium: 8 mg/dL — ABNORMAL LOW (ref 8.4–10.5)
Chloride: 105 mmol/L (ref 96–112)
Chloride: 104 mmol/L (ref 96–112)
Creatinine, Ser: 1.79 mg/dL — ABNORMAL HIGH (ref 0.50–1.10)
Creatinine, Ser: 1.87 mg/dL — ABNORMAL HIGH (ref 0.50–1.10)
GFR calc Af Amer: 40 mL/min — ABNORMAL LOW (ref >90)
GFR calc Af Amer: 38 mL/min — ABNORMAL LOW (ref >90)
GFR calc non Af Amer: 34 mL/min — ABNORMAL LOW (ref >90)
GFR calc non Af Amer: 33 mL/min — ABNORMAL LOW (ref >90)
Glucose, Bld: 102 mg/dL — ABNORMAL HIGH (ref 70–99)
Glucose, Bld: 100 mg/dL — ABNORMAL HIGH (ref 70–99)
Potassium: 2.4 mmol/L — CL (ref 3.5–5.1)
Potassium: 2.8 mmol/L — ABNORMAL LOW (ref 3.5–5.1)
Sodium: 137 mmol/L (ref 135–145)
Sodium: 138 mmol/L (ref 135–145)

## 2014-02-20 LAB — TROPONIN I
Troponin I: <0.03 ng/mL (ref <0.031)
Troponin I: <0.03 ng/mL (ref <0.031)

## 2014-02-20 LAB — CBC
HCT: 29 % — ABNORMAL LOW (ref 36.0–46.0)
Hemoglobin: 9.4 g/dL — ABNORMAL LOW (ref 12.0–15.0)
MCH: 24.7 pg — ABNORMAL LOW (ref 26.0–34.0)
MCHC: 32.4 g/dL (ref 30.0–36.0)
MCV: 76.3 fL — ABNORMAL LOW (ref 78.0–100.0)
Platelets: 301 10*3/uL (ref 150–400)
RBC: 3.8 MIL/uL — ABNORMAL LOW (ref 3.87–5.11)
RDW: 14.7 % (ref 11.5–15.5)
WBC: 5.6 10*3/uL (ref 4.0–10.5)

## 2014-02-20 LAB — URINALYSIS, ROUTINE W REFLEX MICROSCOPIC
Bilirubin Urine: NEGATIVE
Glucose, UA: 250 mg/dL — AB
Ketones, ur: NEGATIVE mg/dL
Leukocytes, UA: NEGATIVE
NITRITE: NEGATIVE
Protein, ur: >300 mg/dL — AB
Specific Gravity, Urine: 1.017 (ref 1.005–1.030)
UROBILINOGEN UA: 0.2 mg/dL (ref 0.0–1.0)
pH: 7 (ref 5.0–8.0)

## 2014-02-20 LAB — IRON AND TIBC
Iron: 25 ug/dL — ABNORMAL LOW (ref 42–145)
Saturation Ratios: 16 % — ABNORMAL LOW (ref 20–55)
TIBC: 156 ug/dL — ABNORMAL LOW (ref 250–470)
UIBC: 131 ug/dL (ref 125–400)

## 2014-02-20 LAB — URINE MICROSCOPIC-ADD ON

## 2014-02-20 LAB — CREATININE, SERUM
Creatinine, Ser: 2.1 mg/dL — ABNORMAL HIGH (ref 0.50–1.10)
GFR calc non Af Amer: 28 mL/min — ABNORMAL LOW (ref >90)
GFR, EST AFRICAN AMERICAN: 33 mL/min — AB (ref >90)

## 2014-02-20 LAB — FERRITIN: Ferritin: 498 ng/mL — ABNORMAL HIGH (ref 10–291)

## 2014-02-20 LAB — MAGNESIUM: MAGNESIUM: 1.6 mg/dL (ref 1.5–2.5)

## 2014-02-20 MED ORDER — MORPHINE SULFATE 2 MG/ML IJ SOLN
2.0000 mg | INTRAMUSCULAR | Status: DC | PRN
  Administered 2014-02-20: 2 mg via INTRAVENOUS
  Filled 2014-02-20: qty 1

## 2014-02-20 MED ORDER — POTASSIUM CHLORIDE 10 MEQ/100ML IV SOLN
10.0000 meq | INTRAVENOUS | Status: AC
  Administered 2014-02-20 (×3): 10 meq via INTRAVENOUS
  Filled 2014-02-20 (×3): qty 100

## 2014-02-20 MED ORDER — METOPROLOL TARTRATE 25 MG PO TABS
25.0000 mg | ORAL_TABLET | Freq: Two times a day (BID) | ORAL | Status: DC
  Administered 2014-02-20 – 2014-02-21 (×3): 25 mg via ORAL
  Filled 2014-02-20 (×3): qty 1

## 2014-02-20 MED ORDER — SODIUM CHLORIDE 0.9 % IV SOLN
INTRAVENOUS | Status: DC
  Administered 2014-02-20: 01:00:00 via INTRAVENOUS

## 2014-02-20 MED ORDER — AMLODIPINE BESYLATE 10 MG PO TABS
10.0000 mg | ORAL_TABLET | Freq: Every day | ORAL | Status: DC
  Administered 2014-02-20 – 2014-02-21 (×2): 10 mg via ORAL
  Filled 2014-02-20 (×2): qty 1

## 2014-02-20 MED ORDER — ZOLPIDEM TARTRATE 5 MG PO TABS
5.0000 mg | ORAL_TABLET | Freq: Every evening | ORAL | Status: DC | PRN
  Administered 2014-02-20: 5 mg via ORAL
  Filled 2014-02-20: qty 1

## 2014-02-20 MED ORDER — ONDANSETRON HCL 4 MG/2ML IJ SOLN: 4.0000 mg | Freq: Four times a day (QID) | INTRAMUSCULAR | Status: DC | PRN

## 2014-02-20 MED ORDER — ATORVASTATIN CALCIUM 20 MG PO TABS
20.0000 mg | ORAL_TABLET | Freq: Every day | ORAL | Status: DC
  Administered 2014-02-20: 20 mg via ORAL
  Filled 2014-02-20: qty 1

## 2014-02-20 MED ORDER — VITAMIN C 500 MG PO TABS
500.0000 mg | ORAL_TABLET | Freq: Every day | ORAL | Status: DC
  Administered 2014-02-20 – 2014-02-21 (×2): 500 mg via ORAL
  Filled 2014-02-20 (×2): qty 1

## 2014-02-20 MED ORDER — INSULIN ASPART 100 UNIT/ML ~~LOC~~ SOLN: 0.0000 [IU] | Freq: Three times a day (TID) | SUBCUTANEOUS | Status: DC

## 2014-02-20 MED ORDER — PANTOPRAZOLE SODIUM 40 MG PO TBEC
40.0000 mg | DELAYED_RELEASE_TABLET | Freq: Every day | ORAL | Status: DC
  Administered 2014-02-20 – 2014-02-21 (×2): 40 mg via ORAL
  Filled 2014-02-20 (×2): qty 1

## 2014-02-20 MED ORDER — POTASSIUM CHLORIDE 20 MEQ PO PACK
40.0000 meq | PACK | Freq: Two times a day (BID) | ORAL | Status: DC
  Filled 2014-02-20 (×2): qty 2

## 2014-02-20 MED ORDER — ASPIRIN EC 325 MG PO TBEC
325.0000 mg | DELAYED_RELEASE_TABLET | Freq: Every day | ORAL | Status: DC
  Administered 2014-02-20 – 2014-02-21 (×2): 325 mg via ORAL
  Filled 2014-02-20 (×2): qty 1

## 2014-02-20 MED ORDER — ACETAMINOPHEN 325 MG PO TABS
650.0000 mg | ORAL_TABLET | ORAL | Status: DC | PRN
  Administered 2014-02-20 – 2014-02-21 (×2): 650 mg via ORAL
  Filled 2014-02-20 (×2): qty 2

## 2014-02-20 MED ORDER — HEPARIN SODIUM (PORCINE) 5000 UNIT/ML IJ SOLN
5000.0000 [IU] | Freq: Three times a day (TID) | INTRAMUSCULAR | Status: DC
  Administered 2014-02-20 – 2014-02-21 (×4): 5000 [IU] via SUBCUTANEOUS
  Filled 2014-02-20 (×4): qty 1

## 2014-02-20 MED ORDER — POTASSIUM CHLORIDE CRYS ER 20 MEQ PO TBCR
40.0000 meq | EXTENDED_RELEASE_TABLET | Freq: Two times a day (BID) | ORAL | Status: AC
  Administered 2014-02-20 – 2014-02-21 (×4): 40 meq via ORAL
  Filled 2014-02-20 (×4): qty 2

## 2014-02-20 MED ORDER — NITROGLYCERIN 0.4 MG SL SUBL: 0.4000 mg | SUBLINGUAL_TABLET | SUBLINGUAL | Status: DC | PRN

## 2014-02-20 NOTE — Progress Notes (Signed)
PATIENT DETAILS Name: Patricia [name] Age: 39 y.o. Sex: female Date of Birth: 08/26/1975 Admit Date: 02/19/2014 Admitting Physician Allyne Gee, MD VFM:[id number], PA-C  Subjective: Feels better-no further chest pain/heaviness.  Assessment/Plan: Principal Problem:   Hypokalemia: Replete and recheck in a.m. Likely secondary to diuretics.  Active Problems:   Chest pain: Atypical, cardiac enzymes negative. Seen by cardiology, recommended 2-D echocardiogram evaluation of LV function-currently pending.    HTN (hypertension): Controlled with amlodipine and metoprolol, resume diuretics on discharge along with potassium.    Anemia: Likely secondary to chronic kidney disease. Further periodic monitoring deferred to the outpatient setting    DM (diabetes mellitus): CBGs stable with SSI, not on any medications as an outpatient-likely diet-controlled.    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 Creatinine close to usual baseline. Stop IV fluids, once potassium better, please resume diuretics along with potassium supplementation.    Dyslipidemia: Continue statin  Disposition: Remain inpatient-home once echocardiogram completed and potassium normalizes. Suspect on 2/17.  Antibiotics:  None   Anti-infectives    None      DVT Prophylaxis: Prophylactic  Heparin   Code Status: Full code  Family Communication None at bedside  Procedures:  None  CONSULTS:  cardiology  Time spent 40 minutes-which includes 50% of the time with face-to-face with patient/ family and coordinating care related to the above assessment and plan.  MEDICATIONS: Scheduled Meds: . amLODipine  10 mg Oral Daily  . aspirin EC  325 mg Oral Daily  . atorvastatin  20 mg Oral QHS  . heparin  5,000 Units Subcutaneous 3 times per day  . insulin aspart  0-15 Units Subcutaneous TID WC  . metoprolol tartrate  25 mg Oral BID  .  pantoprazole  40 mg Oral Daily  . potassium chloride  40 mEq Oral BID  . vitamin C  500 mg Oral Daily   Continuous Infusions:  PRN Meds:.acetaminophen, morphine injection, nitroGLYCERIN, ondansetron (ZOFRAN) IV, zolpidem    PHYSICAL EXAM: Vital signs in last 24 hours: Filed Vitals:   02/20/14 0058 02/20/14 0502 02/20/14 0737 02/20/14 1116  BP: 157/80 124/66 143/79 134/73  Pulse: 68 72 64 66  Temp: 98.3 F (36.8 C) 98.3 F (36.8 C) 98.3 F (36.8 C) 98.7 F (37.1 C)  TempSrc: Oral Oral Oral Oral  Resp: $Remo'16 16 18 17  'YSdYo$ Height: $Rem'5\' 6"'aKHC$  (1.676 m)     Weight: 109.725 kg (241 lb 14.4 oz)     SpO2: 100% 100% 100% 100%    Weight change:  Filed Weights   02/20/14 0058  Weight: 109.725 kg (241 lb 14.4 oz)   Body mass index is 39.06 kg/(m^2).   Gen Exam: Awake and alert with clear speech.   Neck: Supple, No JVD.   Chest: B/L Clear.   CVS: S1 S2 Regular, no murmurs.  Abdomen: soft, BS +, non tender, non distended.  Extremities: no edema, lower extremities warm to touch. Neurologic: Non Focal.   Skin: No Rash.   Wounds: N/A.    Intake/Output from previous day:  Intake/Output Summary (Last 24 hours) at 02/20/14 1527 Last data filed at 02/20/14 1255  Gross per 24 hour  Intake 593.33 ml  Output    800 ml  Net -206.67 ml     LAB RESULTS: CBC  Recent Labs Lab 02/19/14 2136 02/20/14 0144  WBC 5.1 5.6  HGB 11.1* 9.4*  HCT  34.0* 29.0*  PLT 319 301  MCV 74.6* 76.3*  MCH 24.3* 24.7*  MCHC 32.6 32.4  RDW 14.6 14.7    Chemistries   Recent Labs Lab 02/19/14 2136 02/20/14 0144 02/20/14 0800 02/20/14 1313  NA 137  --  137 138  K 2.5*  --  2.4* 2.8*  CL 102  --  105 104  CO2 26  --  29 27  GLUCOSE 129*  --  102* 100*  BUN 27*  --  26* 23  CREATININE 2.20* 2.10* 1.79* 1.87*  CALCIUM 8.0*  --  7.6* 8.0*  MG 1.6  --   --   --     CBG: No results for input(s): GLUCAP in the last 168 hours.  GFR Estimated Creatinine Clearance: 50.2 mL/min (by C-G formula based  on Cr of 1.87).  Coagulation profile No results for input(s): INR, PROTIME in the last 168 hours.  Cardiac Enzymes  Recent Labs Lab 02/20/14 0144 02/20/14 0338 02/20/14 0800  TROPONINI <0.03 <0.03 <0.03    Invalid input(s): POCBNP No results for input(s): DDIMER in the last 72 hours. No results for input(s): HGBA1C in the last 72 hours. No results for input(s): CHOL, HDL, LDLCALC, TRIG, CHOLHDL, LDLDIRECT in the last 72 hours. No results for input(s): TSH, T4TOTAL, T3FREE, THYROIDAB in the last 72 hours.  Invalid input(s): FREET3  Recent Labs  02/20/14 0144  FERRITIN 498*  TIBC 156*  IRON 25*   No results for input(s): LIPASE, AMYLASE in the last 72 hours.  Urine Studies No results for input(s): UHGB, CRYS in the last 72 hours.  Invalid input(s): UACOL, UAPR, USPG, UPH, UTP, UGL, UKET, UBIL, UNIT, UROB, ULEU, UEPI, UWBC, URBC, UBAC, CAST, UCOM, BILUA  MICROBIOLOGY: No results found for this or any previous visit (from the past 240 hour(s)).  RADIOLOGY STUDIES/RESULTS: Dg Chest 2 View  02/19/2014   CLINICAL DATA:  Sharp chest pain today  EXAM: CHEST  2 VIEW  COMPARISON:  January 25, 2014  FINDINGS: The heart size and mediastinal contours are within normal limits. There is no focal infiltrate, pulmonary edema, or pleural effusion. The visualized skeletal structures are stable. There is scoliosis of spine.  IMPRESSION: No active cardiopulmonary disease.   Electronically Signed   By: Sherian Rein M.D.   On: 02/19/2014 21:53   Dg Chest 2 View  01/25/2014   CLINICAL DATA:  Right-sided chest pain, abdominal pain  EXAM: CHEST  2 VIEW  COMPARISON:  11/21/2013  FINDINGS: There is no focal parenchymal opacity, pleural effusion, or pneumothorax. The heart and mediastinal contours are unremarkable.  The osseous structures are unremarkable.  IMPRESSION: No active cardiopulmonary disease.   Electronically Signed   By: Elige Ko   On: 01/25/2014 22:03   US Abdomen  Complete  01/25/2014   CLINICAL DATA:  Abdominal pain. History of hypertension and diabetes mellitus and kidney disease.  EXAM: ULTRASOUND ABDOMEN COMPLETE  COMPARISON:  Renal ultrasound, 09/30/2013.  CT, 03/24/2012.  FINDINGS: Gallbladder: No gallstones or wall thickening visualized. No sonographic Murphy sign noted.  Common bile duct: Diameter: 5.4 mm  Liver: No focal lesion identified. Within normal limits in parenchymal echogenicity.  IVC: No abnormality visualized.  Pancreas: Visualized portion unremarkable.  Spleen: Size and appearance within normal limits.  Right Kidney: Length: 13.6 cm. Borderline increased parenchymal echogenicity. No mass, stone or hydronephrosis.  Left Kidney: Length: 13.8 cm. Borderline increased parenchymal echogenicity. No mass, stone or hydronephrosis  Abdominal aorta: No aneurysm visualized.  Other findings: None.  IMPRESSION: 1. No acute findings. 2. Borderline increased renal parenchymal echogenicity suggests medical renal disease similar to the prior renal ultrasound. No hydronephrosis. 3. No other abnormalities.   Electronically Signed   By: Lajean Manes M.D.   On: 01/25/2014 22:47    Oren Binet, MD  [facility] Pager:[phone]  If 7PM-7AM, please contact night-coverage [email or web address] Password [facility] 02/20/2014, 3:27 PM

## 2014-02-20 NOTE — Progress Notes (Addendum)
CRITICAL VALUE ALERT  Critical value received: KCL 3.4  Date of notification:  02/20/2014  Time of notification:  4619  Critical value read back:Yes.    Nurse who received alert:  Edward Qualia RN  MD notified (1st page): Dr Nigel Bridgeman  Time of first page:  [phone]  MD notified (2nd page):  Time of second page:  Responding MD:  Dr Nigel Bridgeman  Time MD responded:  1000

## 2014-02-20 NOTE — Consult Note (Signed)
CARDIOLOGY CONSULT NOTE   Patient ID: Patricia [name] MRN: [id number], DOB/AGE: 39 years   Admit date: 02/19/2014 Date of Consult: 02/20/2014   Primary Physician: Hillis Range Primary Cardiologist: Dr. Sallyanne Kuster  Pt. Profile  39 year old woman admitted with chest pain.  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  Problem List  Past Medical History  Diagnosis Date  . Hypertension   . Acid reflux   . High cholesterol   . History of blood transfusion "a couple"    "related to low counts"  . Headache(784.0)     "related to chemo; sometimes weekly" (09/12/2013)  . Type II diabetes mellitus     "took me off my RX ~ 04/2013"  . Iron deficiency anemia     "get epogen shots q month" (02/20/2014)  . Chronic kidney disease (CKD), stage III (moderate)   . MCGN (minimal change glomerulonephritis)     "using chemo to tx;  finished my last tx in 12/2013"    Past Surgical History  Procedure Laterality Date  . Ankle fracture surgery Right 1994  . Abdominal hysterectomy  2010    "laparoscopic"  . Fracture surgery    . Cardiac catheterization  2000's     Allergies  Allergies  Allergen Reactions  . Nsaids Other (See Comments)    Cannot take due to Kidney disease    HPI   This pleasant 39 year old woman was admitted yesterday after experienced during chest discomfort at home.  At around supper time prior to eating she noticed brief rapid palpitations and felt dizzy.  She sat down and felt better.  The episode of tachycardia lasted about a minute she thinks.  She then was aware of the left-sided chest discomfort and some radiation down her left arm and some mild dyspnea associated with this.  She was found to be markedly hypokalemic in the emergency room with potassium of 2.5.  She does not have any history of known ischemic heart disease.  She had a normal Lexiscan Myoview on 09/30/13 showing no reversible ischemia and her ejection fraction  was 63%.  Her last echocardiogram on 01/29/13 showed normal systolic function and normal diastolic function. She has a history of chronic anemia secondary to her chronic kidney disease.  This is followed at [facility]. On her last admission she had a short run of PAT.  She had a subsequent 30 day event monitor which was normal as an outpatient.  Inpatient Medications  . amLODipine  10 mg Oral Daily  . aspirin EC  325 mg Oral Daily  . atorvastatin  20 mg Oral QHS  . heparin  5,000 Units Subcutaneous 3 times per day  . insulin aspart  0-15 Units Subcutaneous TID WC  . metoprolol tartrate  25 mg Oral BID  . pantoprazole  40 mg Oral Daily  . vitamin C  500 mg Oral Daily    Family History Family History  Problem Relation Age of Onset  . Hypertension Mother   . Thyroid disease Mother   . Coronary artery disease Father   . Hypertension Father   . Diabetes Father      Social History History   Social History  . Marital Status: Single    Spouse Name: N/A  . Number of Children: N/A  . Years of Education: N/A   Occupational History  . Not on file.   Social History Main Topics  . Smoking status: Never Smoker   . Smokeless tobacco: Never Used  .  Alcohol Use: No  . Drug Use: No  . Sexual Activity: Not Currently   Other Topics Concern  . Not on file   Social History Narrative     Review of Systems  General:  No chills, fever, night sweats or weight changes.  Cardiovascular:  No chest pain, dyspnea on exertion, edema, orthopnea, palpitations, paroxysmal nocturnal dyspnea. Dermatological: No rash, lesions/masses Respiratory: No cough, dyspnea Urologic: No hematuria, dysuria Abdominal:   No nausea, vomiting, diarrhea, bright red blood per rectum, melena, or hematemesis Neurologic:  No visual changes, wkns, changes in mental status. All other systems reviewed and are otherwise negative except as noted above.  Physical Exam  Blood pressure 143/79, pulse 64, temperature 98.3  F (36.8 C), temperature source Oral, resp. rate 18, height $RemoveBe'5\' 6"'gNEtWnXUm$  (1.676 m), weight 241 lb 14.4 oz (109.725 kg), SpO2 100 %.  General: Pleasant, NAD.  Obese Psych: Normal affect. Neuro: Alert and oriented X 3. Moves all extremities spontaneously. HEENT: Normal  Neck: Supple without bruits or JVD. Lungs:  Resp regular and unlabored, CTA. Heart: RRR no s3, s4, or murmurs.  No pericardial rub  Abdomen: Soft, non-tender, non-distended, BS + x 4.  Extremities: No clubbing, cyanosis .  There is trace edema.Marland Kitchen DP/PT/Radials 2+ and equal bilaterally.  Labs   Recent Labs  02/20/14 0144 02/20/14 0338  TROPONINI <0.03 <0.03   Lab Results  Component Value Date   WBC 5.6 02/20/2014   HGB 9.4* 02/20/2014   HCT 29.0* 02/20/2014   MCV 76.3* 02/20/2014   PLT 301 02/20/2014     Recent Labs Lab 02/19/14 2136 02/20/14 0144  NA 137  --   K 2.5*  --   CL 102  --   CO2 26  --   BUN 27*  --   CREATININE 2.20* 2.10*  CALCIUM 8.0*  --   GLUCOSE 129*  --    Lab Results  Component Value Date   CHOL  02/26/2010    175        ATP III CLASSIFICATION:  <200     mg/dL   Desirable  200-239  mg/dL   Borderline High  >=240    mg/dL   High          HDL 33* 02/26/2010   LDLCALC * 02/26/2010    118        Total Cholesterol/HDL:CHD Risk Coronary Heart Disease Risk Table                     Men   Women  1/2 Average Risk   3.4   3.3  Average Risk       5.0   4.4  2 X Average Risk   9.6   7.1  3 X Average Risk  23.4   11.0        Use the calculated Patient Ratio above and the CHD Risk Table to determine the patient's CHD Risk.        ATP III CLASSIFICATION (LDL):  <100     mg/dL   Optimal  100-129  mg/dL   Near or Above                    Optimal  130-159  mg/dL   Borderline  160-189  mg/dL   High  >190     mg/dL   Very High   TRIG 122 02/26/2010   Lab Results  Component Value Date   DDIMER 0.70* 09/28/2013  Radiology/Studies  Dg Chest 2 View  02/19/2014   CLINICAL DATA:   Sharp chest pain today  EXAM: CHEST  2 VIEW  COMPARISON:  January 25, 2014  FINDINGS: The heart size and mediastinal contours are within normal limits. There is no focal infiltrate, pulmonary edema, or pleural effusion. The visualized skeletal structures are stable. There is scoliosis of spine.  IMPRESSION: No active cardiopulmonary disease.   Electronically Signed   By: Abelardo Diesel M.D.   On: 02/19/2014 21:53   Dg Chest 2 View  01/25/2014   CLINICAL DATA:  Right-sided chest pain, abdominal pain  EXAM: CHEST  2 VIEW  COMPARISON:  11/21/2013  FINDINGS: There is no focal parenchymal opacity, pleural effusion, or pneumothorax. The heart and mediastinal contours are unremarkable.  The osseous structures are unremarkable.  IMPRESSION: No active cardiopulmonary disease.   Electronically Signed   By: Kathreen Devoid   On: 01/25/2014 22:03   US Abdomen Complete  01/25/2014   CLINICAL DATA:  Abdominal pain. History of hypertension and diabetes mellitus and kidney disease.  EXAM: ULTRASOUND ABDOMEN COMPLETE  COMPARISON:  Renal ultrasound, 09/30/2013.  CT, 03/24/2012.  FINDINGS: Gallbladder: No gallstones or wall thickening visualized. No sonographic Murphy sign noted.  Common bile duct: Diameter: 5.4 mm  Liver: No focal lesion identified. Within normal limits in parenchymal echogenicity.  IVC: No abnormality visualized.  Pancreas: Visualized portion unremarkable.  Spleen: Size and appearance within normal limits.  Right Kidney: Length: 13.6 cm. Borderline increased parenchymal echogenicity. No mass, stone or hydronephrosis.  Left Kidney: Length: 13.8 cm. Borderline increased parenchymal echogenicity. No mass, stone or hydronephrosis  Abdominal aorta: No aneurysm visualized.  Other findings: None.  IMPRESSION: 1. No acute findings. 2. Borderline increased renal parenchymal echogenicity suggests medical renal disease similar to the prior renal ultrasound. No hydronephrosis. 3. No other abnormalities.   Electronically  Signed   By: Lajean Manes M.D.   On: 01/25/2014 22:47    ECG  EKG shows normal sinus rhythm with nonspecific T-wave changes.  Some of these changes may be secondary to her hypokalemia.  Personally reviewed.  ASSESSMENT AND PLAN  1.  Chest pain, resolved.  No evidence of myocardial infarction.  Troponins are normal. 2.  Abnormal EKG consistent with marked hypokalemia. 3.  Hypertension 4.  Chronic kidney disease stage III followed at [facility] 5.  Anemia of chronic disease  Recommendation: She does not need repeat stress testing.  We will update her echocardiogram.  Her potassium is being repleted. She can probably be discharged later today if echocardiogram is satisfactory and her potassium is satisfactory.   Signed, Darlin Coco, MD  02/20/2014, 8:41 AM

## 2014-02-20 NOTE — Progress Notes (Signed)
UR completed 

## 2014-02-20 NOTE — Progress Notes (Signed)
Echocardiogram 2D Echocardiogram has been performed.  Nabila Albarracin 02/20/2014, 4:04 PM

## 2014-02-20 NOTE — Plan of Care (Signed)
Problem: Phase I Progression Outcomes Goal: Other Phase I Outcomes/Goals Outcome: Not Met (add Reason) KCL lab 2.4

## 2014-02-21 DIAGNOSIS — R079 Chest pain, unspecified: Secondary | ICD-10-CM | POA: Diagnosis not present

## 2014-02-21 DIAGNOSIS — R0789 Other chest pain: Secondary | ICD-10-CM

## 2014-02-21 DIAGNOSIS — N189 Chronic kidney disease, unspecified: Secondary | ICD-10-CM

## 2014-02-21 DIAGNOSIS — E119 Type 2 diabetes mellitus without complications: Secondary | ICD-10-CM | POA: Diagnosis not present

## 2014-02-21 DIAGNOSIS — K219 Gastro-esophageal reflux disease without esophagitis: Secondary | ICD-10-CM | POA: Diagnosis not present

## 2014-02-21 DIAGNOSIS — E1122 Type 2 diabetes mellitus with diabetic chronic kidney disease: Secondary | ICD-10-CM

## 2014-02-21 DIAGNOSIS — R0602 Shortness of breath: Secondary | ICD-10-CM | POA: Diagnosis not present

## 2014-02-21 LAB — BASIC METABOLIC PANEL
Anion gap: 4 — ABNORMAL LOW (ref 5–15)
BUN: 21 mg/dL (ref 6–23)
CHLORIDE: 105 mmol/L (ref 96–112)
CO2: 25 mmol/L (ref 19–32)
Calcium: 7.7 mg/dL — ABNORMAL LOW (ref 8.4–10.5)
Creatinine, Ser: 1.72 mg/dL — ABNORMAL HIGH (ref 0.50–1.10)
GFR, EST AFRICAN AMERICAN: 42 mL/min — AB (ref >90)
GFR, EST NON AFRICAN AMERICAN: 36 mL/min — AB (ref >90)
Glucose, Bld: 104 mg/dL — ABNORMAL HIGH (ref 70–99)
POTASSIUM: 2.8 mmol/L — AB (ref 3.5–5.1)
Sodium: 134 mmol/L — ABNORMAL LOW (ref 135–145)

## 2014-02-21 LAB — URINE CULTURE: Colony Count: 4000

## 2014-02-21 LAB — HEMOGLOBIN A1C
Hgb A1c MFr Bld: 5.9 % — ABNORMAL HIGH (ref 4.8–5.6)
Mean Plasma Glucose: 123 mg/dL

## 2014-02-21 MED ORDER — POTASSIUM CHLORIDE 10 MEQ/100ML IV SOLN
10.0000 meq | INTRAVENOUS | Status: AC
  Administered 2014-02-21 (×3): 10 meq via INTRAVENOUS
  Filled 2014-02-21 (×3): qty 100

## 2014-02-21 MED ORDER — POTASSIUM CHLORIDE CRYS ER 20 MEQ PO TBCR: 20.0000 meq | EXTENDED_RELEASE_TABLET | Freq: Two times a day (BID) | ORAL | Status: DC

## 2014-02-21 NOTE — Progress Notes (Signed)
Patient is refusing blood sugar checks.

## 2014-02-21 NOTE — Progress Notes (Signed)
Subjective: Some chest discomfort mild, she stated she had been passing large amounts of urine. Will check EKG  Objective: Vital signs in last 24 hours: Temp:  [97.4 F (36.3 C)-98.7 F (37.1 C)] 98.4 F (36.9 C) (02/17 0811) Pulse Rate:  [62-74] 62 (02/17 0811) Resp:  [13-19] 16 (02/17 0811) BP: (124-141)/(66-85) 141/83 mmHg (02/17 0811) SpO2:  [98 %-100 %] 100 % (02/17 0811) Weight:  [244 lb 9.6 oz (110.95 kg)] 244 lb 9.6 oz (110.95 kg) (02/17 0400) Weight change: 2 lb 11.2 oz (1.225 kg) Last BM Date: 02/19/14 Intake/Output from previous day: -2160 02/16 0701 - 02/17 0700 In: 740 [P.O.:740] Out: 2900 [Urine:2900] Intake/Output this shift:    PE: General:Pleasant affect, NAD Skin:Warm and dry, brisk capillary refill HEENT:normocephalic, sclera clear, mucus membranes moist Heart:S1S2 RRR without murmur, gallup, rub or click Lungs:clear without rales, rhonchi, or wheezes OZY:YQMG, non tender, + BS, do not palpate liver spleen or masses Ext:no lower ext edema, 2+ pedal pulses, 2+ radial pulses Neuro:alert and oriented, MAE, follows commands, + facial symmetry  tele:  SR Lab Results:  Recent Labs  02/19/14 2136 02/20/14 0144  WBC 5.1 5.6  HGB 11.1* 9.4*  HCT 34.0* 29.0*  PLT 319 301   BMET  Recent Labs  02/20/14 1313 02/21/14 0425  NA 138 134*  K 2.8* 2.8*  CL 104 105  CO2 27 25  GLUCOSE 100* 104*  BUN 23 21  CREATININE 1.87* 1.72*  CALCIUM 8.0* 7.7*    Recent Labs  02/20/14 0338 02/20/14 0800  TROPONINI <0.03 <0.03     Lab Results  Component Value Date   HGBA1C 5.9* 02/20/2014     Lab Results  Component Value Date   TSH 0.690 01/30/2013     Studies/Results: Dg Chest 2 View  02/19/2014   CLINICAL DATA:  Sharp chest pain today  EXAM: CHEST  2 VIEW  COMPARISON:  January 25, 2014  FINDINGS: The heart size and mediastinal contours are within normal limits. There is no focal infiltrate, pulmonary edema, or pleural effusion. The  visualized skeletal structures are stable. There is scoliosis of spine.  IMPRESSION: No active cardiopulmonary disease.   Electronically Signed   By: Abelardo Diesel M.D.   On: 02/19/2014 21:53    Echo 02/20/14 Left ventricle: The cavity size was normal. Wall thickness was normal. Systolic function was normal. The estimated ejection fraction was in the range of 55% to 60%. Wall motion was normal; there were no regional wall motion abnormalities. Left ventricular diastolic function parameters were normal. - Left atrium: The atrium was mildly dilated.    Medications: I have reviewed the patient's current medications. Scheduled Meds: . amLODipine  10 mg Oral Daily  . aspirin EC  325 mg Oral Daily  . atorvastatin  20 mg Oral QHS  . heparin  5,000 Units Subcutaneous 3 times per day  . insulin aspart  0-15 Units Subcutaneous TID WC  . metoprolol tartrate  25 mg Oral BID  . pantoprazole  40 mg Oral Daily  . potassium chloride  10 mEq Intravenous Q1 Hr x 3  . potassium chloride  40 mEq Oral BID  . vitamin C  500 mg Oral Daily   Continuous Infusions:  PRN Meds:.acetaminophen, morphine injection, nitroGLYCERIN, ondansetron (ZOFRAN) IV, zolpidem  Assessment/Plan:39 year old woman admitted with chest pain. 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  K+2.5 in ER.  Normal lexiscan in 09/30/13 EF 63%, short run of PAT on previous admit.    1. Chest pain, resolved. No evidence of myocardial infarction. Troponins are normal. Echo was repeated. No changes will recheck EKG 2. Abnormal EKG consistent with marked hypokalemia. K+ still low today  at 2.8, mag yesterday 1.6 She has received 40 meq IV K+ and 120 meq po since admit.   3. Hypertension stable 125/66 to 141/83 4. Chronic kidney disease stage III followed at [facility] ( was on Zaroxolyn 5 mg daily and demadex 80 mg  daily as outpt)  5. Anemia of chronic disease   Time spent with pt. :15 minutes. [facility]  Nurse Practitioner Certified Pager [phone] or after 5pm and on weekends call [phone] 02/21/2014, 9:47 AM

## 2014-02-21 NOTE — Discharge Summary (Signed)
Physician Discharge Summary  Patricia [name] LTJ:[id number] DOB: [birth year] DOA: 02/19/2014  PCP: Patricia Coop, PA-C  Admit date: 02/19/2014 Discharge date: 02/21/2014  Recommendations for Outpatient Follow-up:  1. Pt will need to follow up with PCP in 2 weeks post discharge 2. Please obtain BMP in 48 hrs  Discharge Diagnoses:  Hypokalemia:  -repleted -home with KCL 20 bid x 2 days -f/u PCP for recheck BMP in 1-2 days--pt was instructed to f/u which she understood and agreed   Chest pain: Atypical, cardiac enzymes negative. Seen by cardiology, recommended 2-D echocardiogram evaluation of LV function- -02/11/14 echo--EF 55-60%, No WMA -09/30/13 Lexiscan stress test negative -case discussed with cardiology, Dr. Daneen Schick on day of d/c whom cleared pt for d/c  HTN (hypertension): Controlled with amlodipine and metoprolol, resume diuretics on discharge along with potassium.   Anemia: Likely secondary to chronic kidney disease. Further periodic monitoring deferred to the outpatient setting   DM (diabetes mellitus): CBGs stable with SSI, not on any medications as an outpatient-likely diet-controlled. -HbA1C--5.9 -f/u PCP as outpt    CKD stage 3 followed at [facility]: Gives a history of minimal change disease being followed by nephrology at [facility]. Creatinine close to usual baseline. Stop IV fluids, once potassium better, please resume diuretics along with potassium supplementation. 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 She stated that it was left-sided bradycardia down her arm with some associated shortness of breath and dizziness. There was no syncope. Notably, the patient had a Lexiscan stress test on 09/30/2013 which was negative. Troponins were negative 3. Echocardiogram was obtained and showed EF 55-60%, no wall motion abnormalities. The patient was noted to be hypokalemic. This was repleted aggressively. Given the patient's chronic diuretic use, the patient was sent home with potassium supplementation 20 mg twice a day. The patient was instructed to follow up with her primary care provider to recheck a potassium in 48 hours.    Consultants: Cardiology--[facility]  Discharge Exam: Filed Vitals:   02/21/14 0811  BP: 141/83  Pulse: 62  Temp: 98.4 F (36.9 C)  Resp: 16   Filed Vitals:   02/20/14 2101 02/21/14 0006 02/21/14 0400 02/21/14 0811  BP: 133/70 139/73 125/66 141/83  Pulse: 74 65 69 62  Temp:  97.4 F (36.3 C) 98.4 F (36.9 C) 98.4 F (36.9 C)  TempSrc:    Oral  Resp:  $Remo'16 13 16  'loQCS$ Height:      Weight:   110.95 kg (244 lb 9.6 oz)   SpO2:  100% 99% 100%   General: A&O x 3, NAD, pleasant, cooperative Cardiovascular: RRR, no rub, no gallop, no S3 Respiratory: CTAB, no wheeze, no rhonchi Abdomen:soft, nontender, nondistended, positive bowel sounds Extremities: No edema, No lymphangitis, no petechiae  Discharge Instructions     Medication List    ASK your doctor about these medications        amLODipine 10 MG tablet  Commonly known as:  NORVASC  Take 10 mg by mouth daily.     aspirin 81 MG chewable  tablet  Chew 1 tablet (81 mg total) by mouth daily.     atorvastatin 20 MG tablet  Commonly known as:  LIPITOR  Take 20 mg by mouth at bedtime.     HYDROcodone-acetaminophen 5-325 MG per tablet  Commonly known as:  NORCO/VICODIN  Take 1-2 tablets by mouth every 6 (six) hours as needed for moderate pain.     metolazone 5 MG tablet  Commonly  known as:  ZAROXOLYN  Take 5 mg by mouth daily.     metoprolol tartrate 25 MG tablet  Commonly known as:  LOPRESSOR  Take 1 tablet (25 mg total) by mouth 2 (two) times daily.     multivitamin with minerals Tabs tablet  Take 1 tablet by mouth daily.     nitroGLYCERIN 0.4 MG SL tablet  Commonly known as:  NITROSTAT  Place 1 tablet (0.4 mg total) under the tongue every 5 (five) minutes as needed for chest pain.     omeprazole 40 MG capsule  Commonly known as:  PRILOSEC  Take 40 mg by mouth daily.     oxyCODONE-acetaminophen 5-325 MG per tablet  Commonly known as:  PERCOCET/ROXICET  Take 1-2 tablets by mouth every 4 (four) hours as needed for moderate pain.     promethazine 25 MG tablet  Commonly known as:  PHENERGAN  Take 1 tablet (25 mg total) by mouth every 6 (six) hours as needed for nausea or vomiting.     torsemide 20 MG tablet  Commonly known as:  DEMADEX  Take 80 mg by mouth daily.     vitamin C 500 MG tablet  Commonly known as:  ASCORBIC ACID  Take 500 mg by mouth daily.         The results of significant diagnostics from this hospitalization (including imaging, microbiology, ancillary and laboratory) are listed below for reference.    Significant Diagnostic Studies: Dg Chest 2 View  02/19/2014   CLINICAL DATA:  Sharp chest pain today  EXAM: CHEST  2 VIEW  COMPARISON:  January 25, 2014  FINDINGS: The heart size and mediastinal contours are within normal limits. There is no focal infiltrate, pulmonary edema, or pleural effusion. The visualized skeletal structures are stable. There is scoliosis of spine.  IMPRESSION: No active cardiopulmonary disease.   Electronically Signed   By: Abelardo Diesel M.D.   On: 02/19/2014 21:53   Dg Chest 2 View  01/25/2014   CLINICAL DATA:  Right-sided chest pain, abdominal pain  EXAM: CHEST  2 VIEW  COMPARISON:  11/21/2013  FINDINGS: There is no focal parenchymal opacity, pleural effusion, or pneumothorax. The heart and mediastinal  contours are unremarkable.  The osseous structures are unremarkable.  IMPRESSION: No active cardiopulmonary disease.   Electronically Signed   By: Kathreen Devoid   On: 01/25/2014 22:03   US Abdomen Complete  01/25/2014   CLINICAL DATA:  Abdominal pain. History of hypertension and diabetes mellitus and kidney disease.  EXAM: ULTRASOUND ABDOMEN COMPLETE  COMPARISON:  Renal ultrasound, 09/30/2013.  CT, 03/24/2012.  FINDINGS: Gallbladder: No gallstones or wall thickening visualized. No sonographic Murphy sign noted.  Common bile duct: Diameter: 5.4 mm  Liver: No focal lesion identified. Within normal limits in parenchymal echogenicity.  IVC: No abnormality visualized.  Pancreas: Visualized portion unremarkable.  Spleen: Size and appearance within normal limits.  Right Kidney: Length: 13.6 cm. Borderline increased parenchymal echogenicity. No mass, stone or hydronephrosis.  Left Kidney: Length: 13.8 cm. Borderline increased parenchymal echogenicity. No mass, stone or hydronephrosis  Abdominal aorta: No aneurysm visualized.  Other findings: None.  IMPRESSION: 1. No acute findings. 2. Borderline increased renal parenchymal echogenicity suggests medical renal disease similar to the prior renal ultrasound. No hydronephrosis. 3. No other abnormalities.   Electronically Signed   By: Lajean Manes M.D.   On: 01/25/2014 22:47     Microbiology: Recent Results (from the past 240 hour(s))  Urine culture     Status: None   Collection Time: 02/19/14 11:51 PM  Result Value Ref Range Status   Specimen Description URINE, CLEAN CATCH  Final   Special Requests NONE  Final   Colony Count   Final    4,000 COLONIES/ML Performed at [facility]    Culture   Final    INSIGNIFICANT GROWTH Performed at [facility]    Report Status 02/21/2014 FINAL  Final     Labs: Basic Metabolic Panel:  Recent Labs Lab 02/19/14 2136 02/20/14 0144 02/20/14 0800 02/20/14 1313 02/21/14 0425  NA 137  --  137 138  134*  K 2.5*  --  2.4* 2.8* 2.8*  CL 102  --  105 104 105  CO2 26  --  $R'29 27 25  'Zx$ GLUCOSE 129*  --  102* 100* 104*  BUN 27*  --  26* 23 21  CREATININE 2.20* 2.10* 1.79* 1.87* 1.72*  CALCIUM 8.0*  --  7.6* 8.0* 7.7*  MG 1.6  --   --   --   --    Liver Function Tests: No results for input(s): AST, ALT, ALKPHOS, BILITOT, PROT, ALBUMIN in the last 168 hours. No results for input(s): LIPASE, AMYLASE in the last 168 hours. No results for input(s): AMMONIA in the last 168 hours. CBC:  Recent Labs Lab 02/19/14 2136 02/20/14 0144  WBC 5.1 5.6  HGB 11.1* 9.4*  HCT 34.0* 29.0*  MCV 74.6* 76.3*  PLT 319 301   Cardiac Enzymes:  Recent Labs Lab 02/20/14 0144 02/20/14 0338 02/20/14 0800  TROPONINI <0.03 <0.03 <0.03   BNP: Invalid input(s): POCBNP CBG: No results for input(s): GLUCAP in the last 168 hours.  Time coordinating discharge:  Greater than 30 minutes  Signed:  Lauri Till, DO [facility] Pager: [phone] 02/21/2014, 11:16 AM

## 2014-02-21 NOTE — Progress Notes (Signed)
UR completed 

## 2014-03-09 ENCOUNTER — Emergency Department (HOSPITAL_COMMUNITY): Payer: Medicaid Other

## 2014-03-09 ENCOUNTER — Encounter (HOSPITAL_COMMUNITY): Payer: Self-pay

## 2014-03-09 ENCOUNTER — Observation Stay (HOSPITAL_COMMUNITY)
Admission: EM | Admit: 2014-03-09 | Discharge: 2014-03-10 | Disposition: A | Discharge status: Home / Self Care | Payer: Medicaid Other | Attending: Internal Medicine | Admitting: Internal Medicine

## 2014-03-09 DIAGNOSIS — N183 Chronic kidney disease, stage 3 (moderate): Secondary | ICD-10-CM | POA: Insufficient documentation

## 2014-03-09 DIAGNOSIS — I129 Hypertensive chronic kidney disease with stage 1 through stage 4 chronic kidney disease, or unspecified chronic kidney disease: Secondary | ICD-10-CM | POA: Insufficient documentation

## 2014-03-09 DIAGNOSIS — R0789 Other chest pain: Secondary | ICD-10-CM | POA: Insufficient documentation

## 2014-03-09 DIAGNOSIS — K219 Gastro-esophageal reflux disease without esophagitis: Secondary | ICD-10-CM | POA: Diagnosis not present

## 2014-03-09 DIAGNOSIS — E119 Type 2 diabetes mellitus without complications: Secondary | ICD-10-CM | POA: Diagnosis not present

## 2014-03-09 DIAGNOSIS — E780 Pure hypercholesterolemia: Secondary | ICD-10-CM | POA: Insufficient documentation

## 2014-03-09 DIAGNOSIS — E786 Lipoprotein deficiency: Secondary | ICD-10-CM | POA: Diagnosis not present

## 2014-03-09 DIAGNOSIS — D509 Iron deficiency anemia, unspecified: Secondary | ICD-10-CM | POA: Insufficient documentation

## 2014-03-09 DIAGNOSIS — E876 Hypokalemia: Principal | ICD-10-CM

## 2014-03-09 DIAGNOSIS — E860 Dehydration: Secondary | ICD-10-CM | POA: Insufficient documentation

## 2014-03-09 DIAGNOSIS — R079 Chest pain, unspecified: Secondary | ICD-10-CM

## 2014-03-09 DIAGNOSIS — Z7982 Long term (current) use of aspirin: Secondary | ICD-10-CM | POA: Insufficient documentation

## 2014-03-09 LAB — I-STAT TROPONIN, ED: Troponin i, poc: 0 ng/mL (ref 0.00–0.08)

## 2014-03-09 LAB — BASIC METABOLIC PANEL
ANION GAP: 11 (ref 5–15)
ANION GAP: 10 (ref 5–15)
BUN: 24 mg/dL — AB (ref 6–23)
BUN: 25 mg/dL — ABNORMAL HIGH (ref 6–23)
CALCIUM: 7.5 mg/dL — AB (ref 8.4–10.5)
CHLORIDE: 102 mmol/L (ref 96–112)
CO2: 28 mmol/L (ref 19–32)
CO2: 26 mmol/L (ref 19–32)
CREATININE: 2.37 mg/dL — AB (ref 0.50–1.10)
CREATININE: 2.21 mg/dL — AB (ref 0.50–1.10)
Calcium: 7.3 mg/dL — ABNORMAL LOW (ref 8.4–10.5)
Chloride: 98 mmol/L (ref 96–112)
GFR calc Af Amer: 28 mL/min — ABNORMAL LOW (ref >90)
GFR calc Af Amer: 31 mL/min — ABNORMAL LOW (ref >90)
GFR calc non Af Amer: 27 mL/min — ABNORMAL LOW (ref >90)
GFR, EST NON AFRICAN AMERICAN: 24 mL/min — AB (ref >90)
GLUCOSE: 104 mg/dL — AB (ref 70–99)
Glucose, Bld: 101 mg/dL — ABNORMAL HIGH (ref 70–99)
Potassium: 2.6 mmol/L — CL (ref 3.5–5.1)
Potassium: 2.9 mmol/L — ABNORMAL LOW (ref 3.5–5.1)
SODIUM: 138 mmol/L (ref 135–145)
Sodium: 137 mmol/L (ref 135–145)

## 2014-03-09 LAB — CBC WITH DIFFERENTIAL/PLATELET
BASOS PCT: 0 % (ref 0–1)
Basophils Absolute: 0 10*3/uL (ref 0.0–0.1)
Basophils Absolute: 0 10*3/uL (ref 0.0–0.1)
Basophils Relative: 0 % (ref 0–1)
EOS PCT: 2 % (ref 0–5)
Eosinophils Absolute: 0.2 10*3/uL (ref 0.0–0.7)
Eosinophils Absolute: 0.2 10*3/uL (ref 0.0–0.7)
Eosinophils Relative: 2 % (ref 0–5)
HCT: 26.9 % — ABNORMAL LOW (ref 36.0–46.0)
HCT: 26.3 % — ABNORMAL LOW (ref 36.0–46.0)
Hemoglobin: 9.1 g/dL — ABNORMAL LOW (ref 12.0–15.0)
Hemoglobin: 8.7 g/dL — ABNORMAL LOW (ref 12.0–15.0)
Lymphocytes Relative: 23 % (ref 12–46)
Lymphocytes Relative: 24 % (ref 12–46)
Lymphs Abs: 1.9 10*3/uL (ref 0.7–4.0)
Lymphs Abs: 1.7 10*3/uL (ref 0.7–4.0)
MCH: 25.3 pg — AB (ref 26.0–34.0)
MCH: 25.2 pg — ABNORMAL LOW (ref 26.0–34.0)
MCHC: 33.8 g/dL (ref 30.0–36.0)
MCHC: 33.1 g/dL (ref 30.0–36.0)
MCV: 74.9 fL — AB (ref 78.0–100.0)
MCV: 76.2 fL — ABNORMAL LOW (ref 78.0–100.0)
MONOS PCT: 7 % (ref 3–12)
Monocytes Absolute: 0.6 10*3/uL (ref 0.1–1.0)
Monocytes Absolute: 0.5 10*3/uL (ref 0.1–1.0)
Monocytes Relative: 7 % (ref 3–12)
Neutro Abs: 5.7 10*3/uL (ref 1.7–7.7)
Neutro Abs: 4.8 10*3/uL (ref 1.7–7.7)
Neutrophils Relative %: 68 % (ref 43–77)
Neutrophils Relative %: 67 % (ref 43–77)
PLATELETS: 295 10*3/uL (ref 150–400)
Platelets: 315 10*3/uL (ref 150–400)
RBC: 3.59 MIL/uL — ABNORMAL LOW (ref 3.87–5.11)
RBC: 3.45 MIL/uL — ABNORMAL LOW (ref 3.87–5.11)
RDW: 14.7 % (ref 11.5–15.5)
RDW: 15 % (ref 11.5–15.5)
WBC: 8.4 10*3/uL (ref 4.0–10.5)
WBC: 7.2 10*3/uL (ref 4.0–10.5)

## 2014-03-09 LAB — TROPONIN I

## 2014-03-09 LAB — I-STAT CHEM 8, ED
BUN: 27 mg/dL — AB (ref 6–23)
Calcium, Ion: 1 mmol/L — ABNORMAL LOW (ref 1.12–1.23)
Chloride: 98 mmol/L (ref 96–112)
Creatinine, Ser: 2.5 mg/dL — ABNORMAL HIGH (ref 0.50–1.10)
Glucose, Bld: 102 mg/dL — ABNORMAL HIGH (ref 70–99)
HEMATOCRIT: 29 % — AB (ref 36.0–46.0)
Hemoglobin: 9.9 g/dL — ABNORMAL LOW (ref 12.0–15.0)
Potassium: 2.6 mmol/L — CL (ref 3.5–5.1)
SODIUM: 137 mmol/L (ref 135–145)
TCO2: 22 mmol/L (ref 0–100)

## 2014-03-09 LAB — MAGNESIUM
Magnesium: 1.4 mg/dL — ABNORMAL LOW (ref 1.5–2.5)
Magnesium: 1.7 mg/dL (ref 1.5–2.5)

## 2014-03-09 LAB — POTASSIUM: Potassium: 3.3 mmol/L — ABNORMAL LOW (ref 3.5–5.1)

## 2014-03-09 MED ORDER — NITROGLYCERIN 0.4 MG SL SUBL
0.4000 mg | SUBLINGUAL_TABLET | SUBLINGUAL | Status: DC | PRN
  Administered 2014-03-09 (×2): 0.4 mg via SUBLINGUAL
  Filled 2014-03-09: qty 1

## 2014-03-09 MED ORDER — TORSEMIDE 20 MG PO TABS
80.0000 mg | ORAL_TABLET | Freq: Every day | ORAL | Status: DC
  Filled 2014-03-09: qty 4

## 2014-03-09 MED ORDER — AMLODIPINE BESYLATE 10 MG PO TABS
10.0000 mg | ORAL_TABLET | Freq: Every day | ORAL | Status: DC
  Administered 2014-03-09: 10 mg via ORAL
  Filled 2014-03-09 (×2): qty 1

## 2014-03-09 MED ORDER — METOPROLOL TARTRATE 25 MG PO TABS
25.0000 mg | ORAL_TABLET | Freq: Two times a day (BID) | ORAL | Status: DC
  Administered 2014-03-09 (×2): 25 mg via ORAL
  Filled 2014-03-09 (×4): qty 1

## 2014-03-09 MED ORDER — NITROGLYCERIN 0.4 MG SL SUBL: 0.4000 mg | SUBLINGUAL_TABLET | SUBLINGUAL | Status: DC | PRN

## 2014-03-09 MED ORDER — MAGNESIUM SULFATE 2 GM/50ML IV SOLN
2.0000 g | Freq: Once | INTRAVENOUS | Status: DC
  Filled 2014-03-09: qty 50

## 2014-03-09 MED ORDER — POTASSIUM CHLORIDE CRYS ER 20 MEQ PO TBCR
60.0000 meq | EXTENDED_RELEASE_TABLET | Freq: Once | ORAL | Status: AC
  Administered 2014-03-09: 60 meq via ORAL
  Filled 2014-03-09: qty 3

## 2014-03-09 MED ORDER — POTASSIUM CHLORIDE CRYS ER 20 MEQ PO TBCR
40.0000 meq | EXTENDED_RELEASE_TABLET | Freq: Once | ORAL | Status: AC
  Administered 2014-03-09: 40 meq via ORAL
  Filled 2014-03-09: qty 2

## 2014-03-09 MED ORDER — PROMETHAZINE HCL 25 MG PO TABS
25.0000 mg | ORAL_TABLET | Freq: Four times a day (QID) | ORAL | Status: DC | PRN
  Administered 2014-03-09 (×2): 25 mg via ORAL
  Filled 2014-03-09 (×2): qty 1

## 2014-03-09 MED ORDER — POTASSIUM CHLORIDE IN NACL 40-0.9 MEQ/L-% IV SOLN
INTRAVENOUS | Status: DC
  Administered 2014-03-09: 75 mL/h via INTRAVENOUS
  Filled 2014-03-09 (×4): qty 1000

## 2014-03-09 MED ORDER — SODIUM CHLORIDE 0.9 % IJ SOLN
3.0000 mL | Freq: Two times a day (BID) | INTRAMUSCULAR | Status: DC
  Administered 2014-03-09 (×2): 3 mL via INTRAVENOUS

## 2014-03-09 MED ORDER — HYDROCODONE-ACETAMINOPHEN 5-325 MG PO TABS: 1.0000 | ORAL_TABLET | Freq: Four times a day (QID) | ORAL | Status: DC | PRN

## 2014-03-09 MED ORDER — ASPIRIN 81 MG PO CHEW
81.0000 mg | CHEWABLE_TABLET | Freq: Every day | ORAL | Status: DC
  Administered 2014-03-09: 81 mg via ORAL
  Filled 2014-03-09: qty 1

## 2014-03-09 MED ORDER — MAGNESIUM SULFATE 2 GM/50ML IV SOLN
2.0000 g | Freq: Once | INTRAVENOUS | Status: AC
  Administered 2014-03-09: 2 g via INTRAVENOUS
  Filled 2014-03-09: qty 50

## 2014-03-09 MED ORDER — VITAMIN C 500 MG PO TABS
500.0000 mg | ORAL_TABLET | Freq: Every day | ORAL | Status: DC
  Administered 2014-03-09: 500 mg via ORAL
  Filled 2014-03-09 (×2): qty 1

## 2014-03-09 MED ORDER — POTASSIUM CHLORIDE 10 MEQ/100ML IV SOLN
10.0000 meq | Freq: Once | INTRAVENOUS | Status: AC
  Administered 2014-03-09: 10 meq via INTRAVENOUS
  Filled 2014-03-09: qty 100

## 2014-03-09 MED ORDER — HEPARIN SODIUM (PORCINE) 5000 UNIT/ML IJ SOLN
5000.0000 [IU] | Freq: Three times a day (TID) | INTRAMUSCULAR | Status: DC
  Administered 2014-03-09 – 2014-03-10 (×4): 5000 [IU] via SUBCUTANEOUS
  Filled 2014-03-09 (×7): qty 1

## 2014-03-09 MED ORDER — POTASSIUM CHLORIDE 10 MEQ/100ML IV SOLN
10.0000 meq | INTRAVENOUS | Status: DC
  Administered 2014-03-09: 10 meq via INTRAVENOUS
  Filled 2014-03-09 (×3): qty 100

## 2014-03-09 MED ORDER — PANTOPRAZOLE SODIUM 40 MG PO TBEC
40.0000 mg | DELAYED_RELEASE_TABLET | Freq: Every day | ORAL | Status: DC
  Administered 2014-03-09: 40 mg via ORAL
  Filled 2014-03-09: qty 1

## 2014-03-09 MED ORDER — POTASSIUM CHLORIDE CRYS ER 20 MEQ PO TBCR
40.0000 meq | EXTENDED_RELEASE_TABLET | Freq: Two times a day (BID) | ORAL | Status: DC
  Administered 2014-03-09 (×2): 40 meq via ORAL
  Filled 2014-03-09 (×4): qty 2

## 2014-03-09 MED ORDER — ADULT MULTIVITAMIN W/MINERALS CH
1.0000 | ORAL_TABLET | Freq: Every day | ORAL | Status: DC
  Administered 2014-03-09: 1 via ORAL
  Filled 2014-03-09 (×2): qty 1

## 2014-03-09 MED ORDER — MAGNESIUM SULFATE 50 % IJ SOLN
2.0000 g | Freq: Once | INTRAVENOUS | Status: DC
  Filled 2014-03-09: qty 4

## 2014-03-09 NOTE — Progress Notes (Signed)
Dear Doctor:  This patient has been identified as a candidate for Midline  for the following reason (s): IV therapy over 48 hours and drug pH or osmolality (causing phlebitis, infiltration in 24 hours) If you agree, please write an order for the indicated device. For any questions contact the Vascular Access Team at [phone] if no answer, please leave a message.  Thank you for supporting the early vascular access assessment program.

## 2014-03-09 NOTE — Progress Notes (Signed)
UR completed 

## 2014-03-09 NOTE — ED Notes (Signed)
Attempt to call report to floor x1. 

## 2014-03-09 NOTE — Progress Notes (Signed)
Patient seen and examined, 39 year old female with significant past medical history of stage III CKD followed at [facility], type 2 diabetes mellitus, iron deficiency anemia presented with severe hypokalemia, hypomagnesemia, chest pain, acute on chronic kidney disease.  BP 145/77 mmHg  Pulse 67  Temp(Src) 98.2 F (36.8 C) (Oral)  Resp 20  Wt 111.7 kg (246 lb 4.1 oz)  SpO2 100%  Patient seen and examined, agreed with assessment and plan Dr. Ernestina Patches - Hold torsemide and metolazone today. Creatinine function was 1.72 in 2/17, 2.5 earlier this morning - Placed on gentle hydration - May need to restart torsemide upon discharge and follow up with her nephrologist in [facility] - Patient needs to be on daily potassium replacement outpatient given high doses of diuretics - Potassium 2.9 today, placed on IV and oral replacement - Magnesium 1.7, was 1.4 overnight, will replace 2 g IV magnesium 1.  - Chest pain has improved, troponin 1 negative, continue serial cardiac enzymes -  recent 2-D echo 2/16 showed EF of 55-60%, patient was seen by cardiology during the previous admission and had recommended no further cardiac workup, patient had presented with chest pain. - Likely DC home tomorrow if stable  Randie Bloodgood M.D. [facility] 03/09/2014, 11:46 AM  Pager: [phone]

## 2014-03-09 NOTE — ED Notes (Signed)
Per EMS - pt from home, began experiencing chest pressure around midnight while laying on couch. Pt reports pain radiating to left shoulder, some nausea and shortness of breath. Denied vomiting/recent cough/pain from palpation. Pt had similar episode 2 weeks ago and was admitted because K+ levels were off. Pt has stage 3 renal failure, not on dialysis. VS - 142/80, NSR, 70bpm, 100% RA. $Remove'324mg'ipOeymu$  aspirin given.

## 2014-03-09 NOTE — H&P (Addendum)
Hospitalist Admission History and Physical  [name] Medical record number: [id number] Date of birth: 24-Jun-1975 Age: 39 y.o. Gender: female  Primary Care Provider: Tereasa Coop, PA-C  Chief Complaint: hypokalemia, chest pain, AoCKI History of Present Illness:This is a 39 y.o. year old female with significant past medical history of Stage 3 CKD followed at [facility], type 2 DM, iron deficiency anemia presenting with hypokalemia, chest pain , AoCKI. Pt reports central chest pain over past 12-24 hours. Minimal radiation. No assd nausea or vomiting. Pt noted to have been admitted for similr episode 2/15-2/17 for similar presentation. Pt states that K was repleted w/ resolution of sxs. Was seen at follow up, K was 3.1. Oral K was not refilled per pt. Has not taken oral K since this point.  Presents to the ER afebrile, hemodynamically stable, satting well on RA. Hgb 9.9, K 2.6, Cr 2.5. Trop neg. EKG NSR w/ nonspecific TWI (consistent w/ previous EKGs). Mag 1.4   Assessment and Plan: [name] is a 39 y.o. year old female presenting with Hypokalemia, chest pain, AoCKI  Active Problems:   Hypokalemia  1- Hypoklaemia -recurrent issue in setting of diuretic use -concominant hypomagnesium -replete  -follow  2- Chest Pain -somewhat atypical  -similar presentation to previous -trop neg x1 -EKG consistent with previous -formal cards eval during last admission -symptomatically improved s/p K correction -cycle CEs  -follow  3- AoCKI -baseline Cr 1.8 -Cr 2.5 today -hold zaroxylyn and torsemide -gentle hydration -follow closely   4- Type 2 DM -diet controlled  -A1C 2/16 5.9 -follow  FEN/GI: renal diet  Prophylaxis: sub q heparin  Disposition: pending further evaluation  Code Status:FUll COde    Patient Active Problem List   Diagnosis Date Noted  . Chest pain 02/19/2014  . Hypokalemia 02/19/2014  . Nephrotic syndrome 10/02/2013  . Dyslipidemia 10/02/2013   . PAT (paroxysmal atrial tachycardia) 09/29/2013  . CKD stage 3 followed at [facility] 09/12/2013  . Symptomatic anemia 06/29/2013  . Edema 06/29/2013  . Fever, unspecified 01/31/2013  . Glomerulonephritis 01/28/2013  . Acute renal failure 01/28/2013  . Syncope 01/28/2013  . Chest pain with moderate risk of acute coronary syndrome 07/04/2011  . Shortness of breath 07/04/2011  . Morbid obesity 07/04/2011  . HTN (hypertension) 07/04/2011  . DM (diabetes mellitus) 07/04/2011  . Hypercholesterolemia 07/04/2011   Past Medical History: Past Medical History  Diagnosis Date  . Hypertension   . Acid reflux   . High cholesterol   . History of blood transfusion "a couple"    "related to low counts"  . Headache(784.0)     "related to chemo; sometimes weekly" (09/12/2013)  . Type II diabetes mellitus     "took me off my RX ~ 04/2013"  . Iron deficiency anemia     "get epogen shots q month" (02/20/2014)  . Chronic kidney disease (CKD), stage III (moderate)   . 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 Ankle fracture surgery Right 1994  . Abdominal hysterectomy  2010    "laparoscopic"  . Fracture surgery    . Cardiac catheterization  2000's    Social History: History   Social History  . Marital Status: Single    Spouse Name: N/A  . Number of Children: N/A  . Years of Education: N/A   Social History Main Topics  .  Smoking status: Never Smoker   . Smokeless tobacco: Never Used  . Alcohol Use: No  . Drug Use: No  . Sexual Activity: Not Currently   Other Topics Concern  . None   Social History Narrative    Family History: Family History  Problem Relation Age of Onset  . Hypertension Mother   . Thyroid disease Mother   . Coronary artery disease Father   . Hypertension Father   . Diabetes Father     Allergies: Allergies  Allergen  Reactions  . Nsaids Other (See Comments)    Cannot take due to Kidney disease    Current Facility-Administered Medications  Medication Dose Route Frequency Provider Last Rate Last Dose  . 0.9 % NaCl with KCl 40 mEq / L  infusion   Intravenous Continuous Shanda Howells, MD      . amLODipine (NORVASC) tablet 10 mg  10 mg Oral Daily Shanda Howells, MD      . aspirin chewable tablet 81 mg  81 mg Oral Daily Shanda Howells, MD      . heparin injection 5,000 Units  5,000 Units Subcutaneous 3 times per day Shanda Howells, MD      . HYDROcodone-acetaminophen (NORCO/VICODIN) 5-325 MG per tablet 1-2 tablet  1-2 tablet Oral Q6H PRN Shanda Howells, MD      . magnesium sulfate IVPB 2 g 50 mL  2 g Intravenous Once Everlene Balls, MD 50 mL/hr at 03/09/14 0214 2 g at 03/09/14 0214  . metoprolol tartrate (LOPRESSOR) tablet 25 mg  25 mg Oral BID Shanda Howells, MD      . multivitamin with minerals tablet 1 tablet  1 tablet Oral Daily Shanda Howells, MD      . nitroGLYCERIN (NITROSTAT) SL tablet 0.4 mg  0.4 mg Sublingual Q5 min PRN Everlene Balls, MD   0.4 mg at 03/09/14 0124  . nitroGLYCERIN (NITROSTAT) SL tablet 0.4 mg  0.4 mg Sublingual Q5 min PRN Shanda Howells, MD      . pantoprazole (PROTONIX) EC tablet 40 mg  40 mg Oral Daily Shanda Howells, MD      . potassium chloride 10 mEq in 100 mL IVPB  10 mEq Intravenous Once Everlene Balls, MD 100 mL/hr at 03/09/14 0214 10 mEq at 03/09/14 0214  . promethazine (PHENERGAN) tablet 25 mg  25 mg Oral Q6H PRN Shanda Howells, MD      . sodium chloride 0.9 % injection 3 mL  3 mL Intravenous Q12H Shanda Howells, MD      . torsemide [facility]) tablet 80 mg  80 mg Oral Daily Shanda Howells, MD      . vitamin C (ASCORBIC ACID) tablet 500 mg  500 mg Oral Daily Shanda Howells, MD       Current Outpatient Prescriptions  Medication Sig Dispense Refill  . amLODipine (NORVASC) 10 MG tablet Take 10 mg by mouth daily.    Marland Kitchen aspirin 81 MG chewable tablet Chew 1 tablet (81 mg total) by mouth daily.    Marland Kitchen  atorvastatin (LIPITOR) 20 MG tablet Take 20 mg by mouth at bedtime.    Marland Kitchen HYDROcodone-acetaminophen (NORCO/VICODIN) 5-325 MG per tablet Take 1-2 tablets by mouth every 6 (six) hours as needed for moderate pain. 20 tablet 0  . metolazone (ZAROXOLYN) 5 MG tablet Take 5 mg by mouth daily.    . metoprolol tartrate (LOPRESSOR) 25 MG tablet Take 1 tablet (25 mg total) by mouth 2 (two) times daily. 62 tablet 0  . Multiple Vitamin (MULTIVITAMIN WITH MINERALS)  TABS tablet Take 1 tablet by mouth daily.    . nitroGLYCERIN (NITROSTAT) 0.4 MG SL tablet Place 1 tablet (0.4 mg total) under the tongue every 5 (five) minutes as needed for chest pain. 15 tablet 0  . omeprazole (PRILOSEC) 40 MG capsule Take 40 mg by mouth daily.      . potassium chloride SA (K-DUR,KLOR-CON) 20 MEQ tablet Take 1 tablet (20 mEq total) by mouth 2 (two) times daily. 4 tablet 0  . promethazine (PHENERGAN) 25 MG tablet Take 1 tablet (25 mg total) by mouth every 6 (six) hours as needed for nausea or vomiting. 65 tablet 1  . torsemide (DEMADEX) 20 MG tablet Take 80 mg by mouth daily.    . vitamin C (ASCORBIC ACID) 500 MG tablet Take 500 mg by mouth daily.     Review Of Systems: 12 point ROS negative except as noted above in HPI.  Physical Exam: Filed Vitals:   03/09/14 0200  BP: 129/68  Pulse: 65  Temp:   Resp: 18    General: alert and cooperative HEENT: PERRLA and extra ocular movement intact Heart: S1, S2 normal, no murmur, rub or gallop, regular rate and rhythm Lungs: clear to auscultation, no wheezes or rales and unlabored breathing Abdomen: abdomen is soft without significant tenderness, masses, organomegaly or guarding Extremities: extremities normal, atraumatic, no cyanosis or edema Skin:no rashes Neurology: normal without focal findings  Labs and Imaging: Lab Results  Component Value Date/Time   NA 137 03/09/2014 01:34 AM   K 2.6* 03/09/2014 01:34 AM   CL 98 03/09/2014 01:34 AM   CO2 28 03/09/2014 01:15 AM   BUN  27* 03/09/2014 01:34 AM   CREATININE 2.50* 03/09/2014 01:34 AM   GLUCOSE 102* 03/09/2014 01:34 AM   Lab Results  Component Value Date   WBC 8.4 03/09/2014   HGB 9.9* 03/09/2014   HCT 29.0* 03/09/2014   MCV 74.9* 03/09/2014   PLT 315 03/09/2014    Dg Chest 2 View  03/09/2014   CLINICAL DATA:  Acute onset of left-sided chest pain and shortness of breath. Initial encounter.  EXAM: CHEST  2 VIEW  COMPARISON:  Chest radiograph performed 02/19/2014  FINDINGS: The lungs are mildly hypoexpanded but appear grossly clear. There is no evidence of focal opacification, pleural effusion or pneumothorax.  The heart is normal in size; the mediastinal contour is within normal limits. No acute osseous abnormalities are seen.  IMPRESSION: Lungs mildly hypoexpanded but grossly clear.   Electronically Signed   By: Garald Balding M.D.   On: 03/09/2014 02:22           Shanda Howells MD  Pager: [phone]

## 2014-03-09 NOTE — ED Provider Notes (Signed)
CSN: [id number]     Arrival date & time 03/09/14  0048 History  This chart was scribed for Everlene Balls, MD by Molli Posey, ED Scribe. This patient was seen in room A03C/A03C and the patient's care was started 12:51 AM.   Chief Complaint  Patient presents with  . Chest Pain   HPI HPI Comments: Patricia [name] is a 39 y.o. female with a history of HTN, DM and CKD who presents to the Emergency Department complaining of left sided CP that started about 1.5 hours ago when she was sitting on the couch. Pt states that it radiates to her left shoulder and left neck. She describes her pain as a pressure. Pt states she started sweating, felt nauseated and SOB during the onset of her pain. Pt was in the hospital 2 weeks ago and received potassium IV. Pt states she has been having trouble with her potassium levels recently. Pt denies any history of blood clots or MIs. Pt denies any recent travel or surgeries. She denies vomiting, fever and cough.   Past Medical History  Diagnosis Date  . Hypertension   . Acid reflux   . High cholesterol   . History of blood transfusion "a couple"    "related to low counts"  . Headache(784.0)     "related to chemo; sometimes weekly" (09/12/2013)  . Type II diabetes mellitus     "took me off my RX ~ 04/2013"  . Iron deficiency anemia     "get epogen shots q month" (02/20/2014)  . Chronic kidney disease (CKD), stage III (moderate)   . 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 Ankle fracture surgery Right 1994  . Abdominal hysterectomy  2010    "laparoscopic"  . Fracture surgery    . Cardiac catheterization  2000's   Family History  Problem Relation Age of Onset  . Hypertension Mother   . Thyroid disease Mother   . Coronary artery disease Father   . Hypertension Father   . Diabetes Father    History  Substance Use Topics  . Smoking status: Never  Smoker   . Smokeless tobacco: Never Used  . Alcohol Use: No   OB History    Gravida Para Term Preterm AB TAB SAB Ectopic Multiple Living   3        3      Review of Systems  Constitutional: Positive for diaphoresis. Negative for fever.  Respiratory: Positive for shortness of breath.   Cardiovascular: Positive for chest pain.  Gastrointestinal: Positive for nausea. Negative for vomiting.  All other systems reviewed and are negative.  Allergies  Nsaids  Home Medications   Prior to Admission medications   Medication Sig Start Date End Date Taking? Authorizing Provider  amLODipine (NORVASC) 10 MG tablet Take 10 mg by mouth daily.    Historical Provider, MD  aspirin 81 MG chewable tablet Chew 1 tablet (81 mg total) by mouth daily. 10/03/13   Eugenie Filler, MD  atorvastatin (LIPITOR) 20 MG tablet Take 20 mg by mouth at bedtime.    Historical Provider, MD  HYDROcodone-acetaminophen (NORCO/VICODIN) 5-325 MG per tablet Take 1-2 tablets by mouth every 6 (six) hours as needed for moderate pain. 01/25/14   Fredia Sorrow, MD  metolazone (ZAROXOLYN) 5 MG tablet Take 5 mg by mouth daily. 02/15/14   Historical Provider, MD  metoprolol tartrate (LOPRESSOR)  25 MG tablet Take 1 tablet (25 mg total) by mouth 2 (two) times daily. 10/03/13   Eugenie Filler, MD  Multiple Vitamin (MULTIVITAMIN WITH MINERALS) TABS tablet Take 1 tablet by mouth daily.    Historical Provider, MD  nitroGLYCERIN (NITROSTAT) 0.4 MG SL tablet Place 1 tablet (0.4 mg total) under the tongue every 5 (five) minutes as needed for chest pain. 10/03/13   Eugenie Filler, MD  omeprazole (PRILOSEC) 40 MG capsule Take 40 mg by mouth daily.      Historical Provider, MD  potassium chloride SA (K-DUR,KLOR-CON) 20 MEQ tablet Take 1 tablet (20 mEq total) by mouth 2 (two) times daily. 02/22/14   Orson Eva, MD  promethazine (PHENERGAN) 25 MG tablet Take 1 tablet (25 mg total) by mouth every 6 (six) hours as needed for nausea or vomiting.  09/14/13   Theodis Blaze, MD  torsemide (DEMADEX) 20 MG tablet Take 80 mg by mouth daily. 09/07/13   Historical Provider, MD  vitamin C (ASCORBIC ACID) 500 MG tablet Take 500 mg by mouth daily.    Historical Provider, MD   There were no vitals taken for this visit. Physical Exam  Constitutional: She is oriented to person, place, and time. She appears well-developed and well-nourished. No distress.  Obese.   HENT:  Head: Normocephalic and atraumatic.  Nose: Nose normal.  Mouth/Throat: Oropharynx is clear and moist. No oropharyngeal exudate.  Eyes: Conjunctivae and EOM are normal. Pupils are equal, round, and reactive to light. No scleral icterus.  Neck: Normal range of motion. Neck supple. No JVD present. No tracheal deviation present. No thyromegaly present.  Cardiovascular: Normal rate, regular rhythm and normal heart sounds.  Exam reveals no gallop and no friction rub.   No murmur heard. Pulmonary/Chest: Effort normal and breath sounds normal. No respiratory distress. She has no wheezes. She exhibits no tenderness.  Abdominal: Soft. Bowel sounds are normal. She exhibits no distension and no mass. There is no tenderness. There is no rebound and no guarding.  Musculoskeletal: Normal range of motion. She exhibits no edema or tenderness.  Lymphadenopathy:    She has no cervical adenopathy.  Neurological: She is alert and oriented to person, place, and time. No cranial nerve deficit. She exhibits normal muscle tone.  Skin: Skin is warm and dry. No rash noted. No erythema. No pallor.  Nursing note and vitals reviewed.   ED Course  Procedures   DIAGNOSTIC STUDIES: Oxygen Saturation is 100% on RA, normal by my interpretation.    COORDINATION OF CARE: 12:57 AM Discussed treatment plan with pt at bedside and pt agreed to plan.   Labs Review Labs Reviewed  CBC WITH DIFFERENTIAL/PLATELET - Abnormal; Notable for the following:    RBC 3.59 (*)    Hemoglobin 9.1 (*)    HCT 26.9 (*)    MCV  74.9 (*)    MCH 25.3 (*)    All other components within normal limits  BASIC METABOLIC PANEL - Abnormal; Notable for the following:    Potassium 2.6 (*)    Glucose, Bld 104 (*)    BUN 24 (*)    Creatinine, Ser 2.37 (*)    Calcium 7.5 (*)    GFR calc non Af Amer 24 (*)    GFR calc Af Amer 28 (*)    All other components within normal limits  MAGNESIUM - Abnormal; Notable for the following:    Magnesium 1.4 (*)    All other components within normal limits  I-STAT  CHEM 8, ED - Abnormal; Notable for the following:    Potassium 2.6 (*)    BUN 27 (*)    Creatinine, Ser 2.50 (*)    Glucose, Bld 102 (*)    Calcium, Ion 1.00 (*)    Hemoglobin 9.9 (*)    HCT 29.0 (*)    All other components within normal limits  CBC  COMPREHENSIVE METABOLIC PANEL  COMPREHENSIVE METABOLIC PANEL  COMPREHENSIVE METABOLIC PANEL  CBC WITH DIFFERENTIAL/PLATELET  TROPONIN I  TROPONIN I  TROPONIN I  MAGNESIUM  I-STAT TROPOININ, ED    Imaging Review Dg Chest 2 View  03/09/2014   CLINICAL DATA:  Acute onset of left-sided chest pain and shortness of breath. Initial encounter.  EXAM: CHEST  2 VIEW  COMPARISON:  Chest radiograph performed 02/19/2014  FINDINGS: The lungs are mildly hypoexpanded but appear grossly clear. There is no evidence of focal opacification, pleural effusion or pneumothorax.  The heart is normal in size; the mediastinal contour is within normal limits. No acute osseous abnormalities are seen.  IMPRESSION: Lungs mildly hypoexpanded but grossly clear.   Electronically Signed   By: Garald Balding M.D.   On: 03/09/2014 02:22     EKG Interpretation   Date/Time:  Friday March 09 2014 01:29:58 EST Ventricular Rate:  73 PR Interval:  158 QRS Duration: 101 QT Interval:  464 QTC Calculation: 511 R Axis:   43 Text Interpretation:  Sinus rhythm Prolonged QT interval U waves present  consistent with hypokalemia Confirmed by Glynn Octave [phone]) on  03/09/2014 1:52:47 AM      MDM    Final diagnoses:  None   Patient presents emergency department for chest pain. EKG by EMS does show some ST depression and flipped T waves in V2 and V3. Patient has U waves present as well consistent with hypokalemia. Patient's magnesium is low. She was given magnesium IV, potassium orally and potassium IV. Patient states that nitroglycerin did help her chest pain. Aspirin was given by EMS. Patient also has mild history of present illness with a creatinine of 2.5. She was admitted to [facility] for chest pain management and slight replacement.   I personally performed the services described in this documentation, which was scribed in my presence. The recorded information has been reviewed and is accurate.     Everlene Balls, MD 03/09/14 [phone]

## 2014-03-09 NOTE — ED Notes (Signed)
Pt placed in gown and in bed. Pt monitored by pulse ox, bp cuff, and 12-lead. 

## 2014-03-09 NOTE — Plan of Care (Signed)
Problem: Consults Goal: Chest Pain Patient Education (See Patient Education module for education specifics.) Pt has not complained of chest pain, but has complained of feeling "pressure" on left side, will continue to monitor pt and assist as needed Goal: Skin Care Protocol Initiated - if Braden Score 18 or less If consults are not indicated, leave blank or document N/A No skin issues at this time  Goal: Tobacco Cessation referral if indicated Pt is nonsmoker Goal: Diabetes Guidelines if Diabetic/Glucose > 140 If diabetic or lab glucose is > 140 mg/dl - Initiate Diabetes/Hyperglycemia Guidelines & Document Interventions  Patient is diabetic by history

## 2014-03-10 DIAGNOSIS — N184 Chronic kidney disease, stage 4 (severe): Secondary | ICD-10-CM

## 2014-03-10 LAB — CBC WITH DIFFERENTIAL/PLATELET
BASOS ABS: 0 10*3/uL (ref 0.0–0.1)
Basophils Relative: 0 % (ref 0–1)
EOS ABS: 0.2 10*3/uL (ref 0.0–0.7)
Eosinophils Relative: 4 % (ref 0–5)
HEMATOCRIT: 27.5 % — AB (ref 36.0–46.0)
Hemoglobin: 9.1 g/dL — ABNORMAL LOW (ref 12.0–15.0)
LYMPHS ABS: 1.5 10*3/uL (ref 0.7–4.0)
LYMPHS PCT: 25 % (ref 12–46)
MCH: 24.8 pg — AB (ref 26.0–34.0)
MCHC: 33.1 g/dL (ref 30.0–36.0)
MCV: 74.9 fL — AB (ref 78.0–100.0)
MONOS PCT: 7 % (ref 3–12)
Monocytes Absolute: 0.4 10*3/uL (ref 0.1–1.0)
Neutro Abs: 3.7 10*3/uL (ref 1.7–7.7)
Neutrophils Relative %: 64 % (ref 43–77)
Platelets: 325 10*3/uL (ref 150–400)
RBC: 3.67 MIL/uL — AB (ref 3.87–5.11)
RDW: 14.8 % (ref 11.5–15.5)
WBC: 5.7 10*3/uL (ref 4.0–10.5)

## 2014-03-10 MED ORDER — SUMATRIPTAN SUCCINATE 50 MG PO TABS
50.0000 mg | ORAL_TABLET | Freq: Once | ORAL | Status: AC
  Administered 2014-03-10: 50 mg via ORAL
  Filled 2014-03-10: qty 1

## 2014-03-10 MED ORDER — POTASSIUM CHLORIDE CRYS ER 20 MEQ PO TBCR: 40.0000 meq | EXTENDED_RELEASE_TABLET | Freq: Every day | ORAL | Status: DC

## 2014-03-10 MED ORDER — POTASSIUM CHLORIDE CRYS ER 20 MEQ PO TBCR: 20.0000 meq | EXTENDED_RELEASE_TABLET | Freq: Every day | ORAL | Status: DC

## 2014-03-10 NOTE — Progress Notes (Signed)
Discharged to home with family office visits in place teaching done  

## 2014-03-10 NOTE — Discharge Summary (Signed)
Physician Discharge Summary  Aftan Vint NOB:[id number] DOB: Nov 19, 1975 DOA: 03/09/2014  PCP: Tereasa Coop, PA-C  Admit date: 03/09/2014 Discharge date: 03/10/2014  Time spent: >35 minutes  Recommendations for Outpatient Follow-up:  F/u with nephrology in 3-5 days F/u with PCP  Next week   Discharge Diagnoses:  Active Problems:   Hypokalemia   Discharge Condition: stable   Diet recommendation: low sodium   Filed Weights   03/09/14 0333  Weight: 111.7 kg (246 lb 4.1 oz)    History of present illness:  39 y.o. year old female with significant past medical history of Stage 3 CKD followed at [facility], type 2 DM, iron deficiency anemia presenting with hypokalemia, chest pain , AoCKI. Pt reports central chest pain over past 12-24 hours. Minimal radiation. No assd nausea or vomiting. Pt noted to have been admitted for similr episode 2/15-2/17 for similar presentation. Pt states that K was repleted w/ resolution of sxs.   Hospital Course:  1. Hypokalemia due to diuretics with dehydration (patient was on demedex 80 mg+methalzone 5 mg) -replaced: potassium, and magnesium; will hold diuretics for now; patient has no s/s of fluid overload, lung ar e clear, no edema but she is dehydrated; d/w patient, recommended to f/u with nephrology in 3-5 days at [facility] to resume diuretics if indicated  -will cont PO potasium for 3 more days, repeat f/u BMP with PCP next week   2. CKD 2-3; patient reports minimal change disease with proteinuria; no s/s of fluid overload or uremia; continue outpatient follow up with nephrology at [facility]  3. Chest pains, atypical; recently evaluated by cardiology; chest pains resolved, no exertional symptoms; recent echo: fraction was in the range of 55% to 60%. Wall motion was normal;; trop negative     Procedures:  none (i.e. Studies not automatically included, echos, thoracentesis, etc; not x-rays)  Consultations:  noen  Discharge Exam: Filed Vitals:   03/10/14 0541  BP:  129/58  Pulse: 66  Temp: 98.6 F (37 C)  Resp: 18    General: alert Cardiovascular: s1,s2 rrr Respiratory: CTA BL  Discharge Instructions  Discharge Instructions    Diet - low sodium heart healthy    Complete by:  As directed      Discharge instructions    Complete by:  As directed   Please follow up with nephrologist in 3-5 days     Increase activity slowly    Complete by:  As directed             Medication List    STOP taking these medications        metolazone 5 MG tablet  Commonly known as:  ZAROXOLYN     torsemide 20 MG tablet  Commonly known as:  DEMADEX      TAKE these medications        amLODipine 10 MG tablet  Commonly known as:  NORVASC  Take 10 mg by mouth daily.     aspirin 81 MG chewable tablet  Chew 1 tablet (81 mg total) by mouth daily.     atorvastatin 20 MG tablet  Commonly known as:  LIPITOR  Take 20 mg by mouth at bedtime.     HYDROcodone-acetaminophen 5-325 MG per tablet  Commonly known as:  NORCO/VICODIN  Take 1-2 tablets by mouth every 6 (six) hours as needed for moderate pain.     metoprolol tartrate 25 MG tablet  Commonly known as:  LOPRESSOR  Take 1 tablet (25 mg total) by mouth 2 (two) times  daily.     multivitamin with minerals Tabs tablet  Take 1 tablet by mouth daily.     nitroGLYCERIN 0.4 MG SL tablet  Commonly known as:  NITROSTAT  Place 1 tablet (0.4 mg total) under the tongue every 5 (five) minutes as needed for chest pain.     omeprazole 40 MG capsule  Commonly known as:  PRILOSEC  Take 40 mg by mouth daily.     potassium chloride SA 20 MEQ tablet  Commonly known as:  K-DUR,KLOR-CON  Take 1 tablet (20 mEq total) by mouth daily.     promethazine 25 MG tablet  Commonly known as:  PHENERGAN  Take 1 tablet (25 mg total) by mouth every 6 (six) hours as needed for nausea or vomiting.     SUMAtriptan 50 MG tablet  Commonly known as:  IMITREX  Take 50 mg by mouth 2 (two) times daily as needed for migraine. May  repeat in 2 hours if headache persists or recurs.     traZODone 50 MG tablet  Commonly known as:  DESYREL  Take 50 mg by mouth at bedtime.     vitamin C 500 MG tablet  Commonly known as:  ASCORBIC ACID  Take 500 mg by mouth daily.       Allergies  Allergen Reactions  . Nsaids Other (See Comments)    Cannot take due to Kidney disease       Follow-up Information    Follow up with LONG,ASHLEY B, PA-C. Schedule an appointment as soon as possible for a visit in 3 days.   Specialty:  Physician Assistant   Contact information:   [place]  [id number] [phone]        The results of significant diagnostics from this hospitalization (including imaging, microbiology, ancillary and laboratory) are listed below for reference.    Significant Diagnostic Studies: Dg Chest 2 View  03/09/2014   CLINICAL DATA:  Acute onset of left-sided chest pain and shortness of breath. Initial encounter.  EXAM: CHEST  2 VIEW  COMPARISON:  Chest radiograph performed 02/19/2014  FINDINGS: The lungs are mildly hypoexpanded but appear grossly clear. There is no evidence of focal opacification, pleural effusion or pneumothorax.  The heart is normal in size; the mediastinal contour is within normal limits. No acute osseous abnormalities are seen.  IMPRESSION: Lungs mildly hypoexpanded but grossly clear.   Electronically Signed   By: Garald Balding M.D.   On: 03/09/2014 02:22   Dg Chest 2 View  02/19/2014   CLINICAL DATA:  Sharp chest pain today  EXAM: CHEST  2 VIEW  COMPARISON:  January 25, 2014  FINDINGS: The heart size and mediastinal contours are within normal limits. There is no focal infiltrate, pulmonary edema, or pleural effusion. The visualized skeletal structures are stable. There is scoliosis of spine.  IMPRESSION: No active cardiopulmonary disease.   Electronically Signed   By: Abelardo Diesel M.D.   On: 02/19/2014 21:53    Microbiology: No results found for this or any previous  visit (from the past 240 hour(s)).   Labs: Basic Metabolic Panel:  Recent Labs Lab 03/09/14 0115 03/09/14 0134 03/09/14 0645 03/09/14 1700  NA 137 137 138  --   K 2.6* 2.6* 2.9* 3.3*  CL 98 98 102  --   CO2 28  --  26  --   GLUCOSE 104* 102* 101*  --   BUN 24* 27* 25*  --   CREATININE 2.37* 2.50* 2.21*  --  CALCIUM 7.5*  --  7.3*  --   MG 1.4*  --  1.7  --    Liver Function Tests: No results for input(s): AST, ALT, ALKPHOS, BILITOT, PROT, ALBUMIN in the last 168 hours. No results for input(s): LIPASE, AMYLASE in the last 168 hours. No results for input(s): AMMONIA in the last 168 hours. CBC:  Recent Labs Lab 03/09/14 0115 03/09/14 0134 03/09/14 0645 03/10/14 0400  WBC 8.4  --  7.2 5.7  NEUTROABS 5.7  --  4.8 3.7  HGB 9.1* 9.9* 8.7* 9.1*  HCT 26.9* 29.0* 26.3* 27.5*  MCV 74.9*  --  76.2* 74.9*  PLT 315  --  295 325   Cardiac Enzymes:  Recent Labs Lab 03/09/14 0645 03/09/14 1454  TROPONINI <0.03 <0.03   BNP: BNP (last 3 results)  Recent Labs  02/19/14 2136  BNP 69.9    ProBNP (last 3 results)  Recent Labs  06/29/13 1426 08/21/13 0739  PROBNP 313.9* 358.8*    CBG: No results for input(s): GLUCAP in the last 168 hours.     SignedKinnie Feil  [facility] 03/10/2014, 10:00 AM

## 2014-04-10 ENCOUNTER — Observation Stay (HOSPITAL_COMMUNITY)
Admission: EM | Admit: 2014-04-10 | Discharge: 2014-04-12 | Disposition: A | Discharge status: Home / Self Care | Payer: Medicaid Other | Attending: Internal Medicine | Admitting: Internal Medicine

## 2014-04-10 ENCOUNTER — Encounter (HOSPITAL_COMMUNITY): Payer: Self-pay | Admitting: Emergency Medicine

## 2014-04-10 ENCOUNTER — Observation Stay (HOSPITAL_COMMUNITY): Payer: Medicaid Other

## 2014-04-10 ENCOUNTER — Emergency Department (HOSPITAL_COMMUNITY): Payer: Medicaid Other

## 2014-04-10 DIAGNOSIS — N183 Chronic kidney disease, stage 3 unspecified: Secondary | ICD-10-CM | POA: Diagnosis present

## 2014-04-10 DIAGNOSIS — Z7982 Long term (current) use of aspirin: Secondary | ICD-10-CM | POA: Insufficient documentation

## 2014-04-10 DIAGNOSIS — N189 Chronic kidney disease, unspecified: Secondary | ICD-10-CM | POA: Diagnosis not present

## 2014-04-10 DIAGNOSIS — K219 Gastro-esophageal reflux disease without esophagitis: Secondary | ICD-10-CM | POA: Insufficient documentation

## 2014-04-10 DIAGNOSIS — Z79899 Other long term (current) drug therapy: Secondary | ICD-10-CM | POA: Diagnosis not present

## 2014-04-10 DIAGNOSIS — D509 Iron deficiency anemia, unspecified: Secondary | ICD-10-CM | POA: Insufficient documentation

## 2014-04-10 DIAGNOSIS — I129 Hypertensive chronic kidney disease with stage 1 through stage 4 chronic kidney disease, or unspecified chronic kidney disease: Secondary | ICD-10-CM | POA: Diagnosis not present

## 2014-04-10 DIAGNOSIS — E780 Pure hypercholesterolemia: Secondary | ICD-10-CM | POA: Insufficient documentation

## 2014-04-10 DIAGNOSIS — Z9889 Other specified postprocedural states: Secondary | ICD-10-CM | POA: Insufficient documentation

## 2014-04-10 DIAGNOSIS — E876 Hypokalemia: Secondary | ICD-10-CM

## 2014-04-10 DIAGNOSIS — E119 Type 2 diabetes mellitus without complications: Secondary | ICD-10-CM | POA: Diagnosis not present

## 2014-04-10 DIAGNOSIS — R079 Chest pain, unspecified: Secondary | ICD-10-CM | POA: Diagnosis present

## 2014-04-10 DIAGNOSIS — R109 Unspecified abdominal pain: Secondary | ICD-10-CM

## 2014-04-10 DIAGNOSIS — I10 Essential (primary) hypertension: Secondary | ICD-10-CM | POA: Diagnosis present

## 2014-04-10 DIAGNOSIS — R0789 Other chest pain: Principal | ICD-10-CM | POA: Insufficient documentation

## 2014-04-10 DIAGNOSIS — N179 Acute kidney failure, unspecified: Secondary | ICD-10-CM | POA: Insufficient documentation

## 2014-04-10 DIAGNOSIS — N059 Unspecified nephritic syndrome with unspecified morphologic changes: Secondary | ICD-10-CM | POA: Diagnosis present

## 2014-04-10 DIAGNOSIS — N049 Nephrotic syndrome with unspecified morphologic changes: Secondary | ICD-10-CM

## 2014-04-10 LAB — URINE MICROSCOPIC-ADD ON

## 2014-04-10 LAB — CBC WITH DIFFERENTIAL/PLATELET
Basophils Absolute: 0 10*3/uL (ref 0.0–0.1)
Basophils Absolute: 0 10*3/uL (ref 0.0–0.1)
Basophils Relative: 0 % (ref 0–1)
Basophils Relative: 1 % (ref 0–1)
EOS PCT: 2 % (ref 0–5)
Eosinophils Absolute: 0.2 10*3/uL (ref 0.0–0.7)
Eosinophils Absolute: 0.2 10*3/uL (ref 0.0–0.7)
Eosinophils Relative: 3 % (ref 0–5)
HCT: 27.1 % — ABNORMAL LOW (ref 36.0–46.0)
HEMATOCRIT: 28.3 % — AB (ref 36.0–46.0)
HEMOGLOBIN: 8.6 g/dL — AB (ref 12.0–15.0)
Hemoglobin: 8.9 g/dL — ABNORMAL LOW (ref 12.0–15.0)
LYMPHS ABS: 1.5 10*3/uL (ref 0.7–4.0)
LYMPHS ABS: 2 10*3/uL (ref 0.7–4.0)
LYMPHS PCT: 26 % (ref 12–46)
LYMPHS PCT: 26 % (ref 12–46)
MCH: 24.2 pg — ABNORMAL LOW (ref 26.0–34.0)
MCH: 24.2 pg — ABNORMAL LOW (ref 26.0–34.0)
MCHC: 31.4 g/dL (ref 30.0–36.0)
MCHC: 31.7 g/dL (ref 30.0–36.0)
MCV: 76.3 fL — ABNORMAL LOW (ref 78.0–100.0)
MCV: 76.9 fL — ABNORMAL LOW (ref 78.0–100.0)
MONO ABS: 0.5 10*3/uL (ref 0.1–1.0)
Monocytes Absolute: 0.5 10*3/uL (ref 0.1–1.0)
Monocytes Relative: 6 % (ref 3–12)
Monocytes Relative: 9 % (ref 3–12)
NEUTROS ABS: 5 10*3/uL (ref 1.7–7.7)
NEUTROS PCT: 62 % (ref 43–77)
Neutro Abs: 3.5 10*3/uL (ref 1.7–7.7)
Neutrophils Relative %: 65 % (ref 43–77)
PLATELETS: 395 10*3/uL (ref 150–400)
Platelets: 491 10*3/uL — ABNORMAL HIGH (ref 150–400)
RBC: 3.55 MIL/uL — AB (ref 3.87–5.11)
RBC: 3.68 MIL/uL — AB (ref 3.87–5.11)
RDW: 14.3 % (ref 11.5–15.5)
RDW: 14.3 % (ref 11.5–15.5)
WBC: 5.6 10*3/uL (ref 4.0–10.5)
WBC: 7.7 10*3/uL (ref 4.0–10.5)

## 2014-04-10 LAB — URINALYSIS, ROUTINE W REFLEX MICROSCOPIC
Bilirubin Urine: NEGATIVE
Glucose, UA: 100 mg/dL — AB
Ketones, ur: NEGATIVE mg/dL
Leukocytes, UA: NEGATIVE
Nitrite: NEGATIVE
Specific Gravity, Urine: 1.016 (ref 1.005–1.030)
Urobilinogen, UA: 0.2 mg/dL (ref 0.0–1.0)
pH: 6.5 (ref 5.0–8.0)

## 2014-04-10 LAB — I-STAT TROPONIN, ED: Troponin i, poc: 0 ng/mL (ref 0.00–0.08)

## 2014-04-10 LAB — CREATININE, SERUM
Creatinine, Ser: 3.78 mg/dL — ABNORMAL HIGH (ref 0.50–1.10)
GFR calc non Af Amer: 14 mL/min — ABNORMAL LOW (ref >90)
GFR, EST AFRICAN AMERICAN: 16 mL/min — AB (ref >90)

## 2014-04-10 LAB — BASIC METABOLIC PANEL
Anion gap: 8 (ref 5–15)
BUN: 31 mg/dL — ABNORMAL HIGH (ref 6–23)
CO2: 25 mmol/L (ref 19–32)
Calcium: 8.6 mg/dL (ref 8.4–10.5)
Chloride: 102 mmol/L (ref 96–112)
Creatinine, Ser: 3.54 mg/dL — ABNORMAL HIGH (ref 0.50–1.10)
GFR calc Af Amer: 17 mL/min — ABNORMAL LOW (ref >90)
GFR, EST NON AFRICAN AMERICAN: 15 mL/min — AB (ref >90)
GLUCOSE: 98 mg/dL (ref 70–99)
POTASSIUM: 3.4 mmol/L — AB (ref 3.5–5.1)
SODIUM: 135 mmol/L (ref 135–145)

## 2014-04-10 LAB — TROPONIN I: Troponin I: <0.03 ng/mL (ref <0.031)

## 2014-04-10 LAB — BRAIN NATRIURETIC PEPTIDE: B Natriuretic Peptide: 9.6 pg/mL (ref 0.0–100.0)

## 2014-04-10 LAB — COMPREHENSIVE METABOLIC PANEL
ALBUMIN: 1.5 g/dL — AB (ref 3.5–5.2)
ALK PHOS: 38 U/L — AB (ref 39–117)
ALT: 12 U/L (ref 0–35)
ANION GAP: 5 (ref 5–15)
AST: 16 U/L (ref 0–37)
BUN: 30 mg/dL — ABNORMAL HIGH (ref 6–23)
CO2: 28 mmol/L (ref 19–32)
CREATININE: 3.61 mg/dL — AB (ref 0.50–1.10)
Calcium: 8.1 mg/dL — ABNORMAL LOW (ref 8.4–10.5)
Chloride: 105 mmol/L (ref 96–112)
GFR calc non Af Amer: 15 mL/min — ABNORMAL LOW (ref >90)
GFR, EST AFRICAN AMERICAN: 17 mL/min — AB (ref >90)
GLUCOSE: 96 mg/dL (ref 70–99)
Potassium: 3.5 mmol/L (ref 3.5–5.1)
Sodium: 138 mmol/L (ref 135–145)
TOTAL PROTEIN: 5.2 g/dL — AB (ref 6.0–8.3)
Total Bilirubin: 0.3 mg/dL (ref 0.3–1.2)

## 2014-04-10 MED ORDER — SODIUM CHLORIDE 0.9 % IV BOLUS (SEPSIS)
500.0000 mL | Freq: Once | INTRAVENOUS | Status: AC
  Administered 2014-04-10: 500 mL via INTRAVENOUS

## 2014-04-10 MED ORDER — AMLODIPINE BESYLATE 10 MG PO TABS
10.0000 mg | ORAL_TABLET | Freq: Every day | ORAL | Status: DC
  Administered 2014-04-11 – 2014-04-12 (×2): 10 mg via ORAL
  Filled 2014-04-10 (×2): qty 1

## 2014-04-10 MED ORDER — NEPRO/CARBSTEADY PO LIQD
237.0000 mL | Freq: Two times a day (BID) | ORAL | Status: DC
  Filled 2014-04-10 (×7): qty 237

## 2014-04-10 MED ORDER — ASPIRIN 81 MG PO CHEW
81.0000 mg | CHEWABLE_TABLET | Freq: Every day | ORAL | Status: DC
  Administered 2014-04-10 – 2014-04-12 (×3): 81 mg via ORAL
  Filled 2014-04-10 (×3): qty 1

## 2014-04-10 MED ORDER — ONDANSETRON HCL 4 MG/2ML IJ SOLN
4.0000 mg | Freq: Four times a day (QID) | INTRAMUSCULAR | Status: DC | PRN
  Administered 2014-04-10: 4 mg via INTRAVENOUS
  Filled 2014-04-10: qty 2

## 2014-04-10 MED ORDER — POTASSIUM CHLORIDE CRYS ER 20 MEQ PO TBCR
40.0000 meq | EXTENDED_RELEASE_TABLET | Freq: Once | ORAL | Status: AC
  Administered 2014-04-10: 40 meq via ORAL
  Filled 2014-04-10: qty 2

## 2014-04-10 MED ORDER — SODIUM CHLORIDE 0.9 % IV SOLN
INTRAVENOUS | Status: AC
  Administered 2014-04-10: 11:00:00 via INTRAVENOUS

## 2014-04-10 MED ORDER — ATORVASTATIN CALCIUM 40 MG PO TABS
40.0000 mg | ORAL_TABLET | Freq: Every day | ORAL | Status: DC
  Administered 2014-04-10 – 2014-04-12 (×3): 40 mg via ORAL
  Filled 2014-04-10 (×4): qty 1

## 2014-04-10 MED ORDER — SODIUM CHLORIDE 0.9 % IJ SOLN
3.0000 mL | Freq: Two times a day (BID) | INTRAMUSCULAR | Status: DC
  Administered 2014-04-11 (×2): 3 mL via INTRAVENOUS

## 2014-04-10 MED ORDER — INSULIN ASPART 100 UNIT/ML ~~LOC~~ SOLN: 0.0000 [IU] | Freq: Three times a day (TID) | SUBCUTANEOUS | Status: DC

## 2014-04-10 MED ORDER — ACETAMINOPHEN 325 MG PO TABS: 650.0000 mg | ORAL_TABLET | Freq: Four times a day (QID) | ORAL | Status: DC | PRN

## 2014-04-10 MED ORDER — PANTOPRAZOLE SODIUM 40 MG PO TBEC
40.0000 mg | DELAYED_RELEASE_TABLET | Freq: Every day | ORAL | Status: DC
  Administered 2014-04-10 – 2014-04-12 (×3): 40 mg via ORAL
  Filled 2014-04-10 (×3): qty 1

## 2014-04-10 MED ORDER — HEPARIN SODIUM (PORCINE) 5000 UNIT/ML IJ SOLN
5000.0000 [IU] | Freq: Three times a day (TID) | INTRAMUSCULAR | Status: DC
  Administered 2014-04-10 – 2014-04-12 (×7): 5000 [IU] via SUBCUTANEOUS
  Filled 2014-04-10 (×7): qty 1

## 2014-04-10 MED ORDER — PROMETHAZINE HCL 25 MG/ML IJ SOLN
25.0000 mg | Freq: Four times a day (QID) | INTRAMUSCULAR | Status: DC | PRN
  Administered 2014-04-10 – 2014-04-11 (×3): 25 mg via INTRAVENOUS
  Filled 2014-04-10 (×3): qty 1

## 2014-04-10 MED ORDER — ONDANSETRON HCL 4 MG PO TABS: 4.0000 mg | ORAL_TABLET | Freq: Four times a day (QID) | ORAL | Status: DC | PRN

## 2014-04-10 MED ORDER — METOPROLOL TARTRATE 25 MG PO TABS
25.0000 mg | ORAL_TABLET | Freq: Two times a day (BID) | ORAL | Status: DC
  Administered 2014-04-10 – 2014-04-12 (×5): 25 mg via ORAL
  Filled 2014-04-10 (×5): qty 1

## 2014-04-10 MED ORDER — SODIUM CHLORIDE 0.9 % IV BOLUS (SEPSIS): 1000.0000 mL | INTRAVENOUS | Status: DC | PRN

## 2014-04-10 MED ORDER — TRAZODONE HCL 50 MG PO TABS
50.0000 mg | ORAL_TABLET | Freq: Every day | ORAL | Status: DC
  Administered 2014-04-10 – 2014-04-11 (×2): 50 mg via ORAL
  Filled 2014-04-10 (×2): qty 1

## 2014-04-10 MED ORDER — HYDROCODONE-ACETAMINOPHEN 5-325 MG PO TABS
1.0000 | ORAL_TABLET | Freq: Four times a day (QID) | ORAL | Status: DC | PRN
  Administered 2014-04-10: 1 via ORAL
  Filled 2014-04-10: qty 1

## 2014-04-10 MED ORDER — ACETAMINOPHEN 650 MG RE SUPP: 650.0000 mg | Freq: Four times a day (QID) | RECTAL | Status: DC | PRN

## 2014-04-10 MED ORDER — INSULIN ASPART 100 UNIT/ML ~~LOC~~ SOLN: 0.0000 [IU] | Freq: Every day | SUBCUTANEOUS | Status: DC

## 2014-04-10 NOTE — ED Notes (Signed)
Pt reporting nausea at this time

## 2014-04-10 NOTE — ED Notes (Signed)
The patient said she has been having chest pain, SOB since Friday. She rates her pain 7/10. She has also been nauseated, and fatigued.  The patient does have chronic kidney disease and says she has low potassium.  She also has not been able to eat and feels like her heart is doing "crazy stuff".  She is here to be evaluated.

## 2014-04-10 NOTE — ED Notes (Signed)
Pt appears to be asleep at this time; no needs

## 2014-04-10 NOTE — Progress Notes (Signed)
[facility] PROGRESS NOTE  Patricia [name] WIO:[id number] DOB: 1975-01-15 DOA: 04/10/2014 PCP: Hillis Range  Brief narrative: 39 year old female with history of chronic kidney disease  stage 3, secondary to nephrotic syndrome ( follows with Dr Leslee Home at [facility]) with baseline creatine around 1.4 who was  hospitalized in February  this year with worsened renal function and her diuretics were held. She saw her nephrologist 4 weeks back at that time had severe leg edema and was resumed back on torsemide and metolazone. Vision presents with chest pain symptoms and weakness with poor appetite and nausea for the past few days. She was orthostatic in the ED with marked tachycardia on standing up.  Also noted for worsened renal function. admitted hospitalist service     Assessment/Plan:  acute on chronic kidney disease stage III Possibly prerenal vs worsening of her nephrotic syndrome.  she does have orthostasis and given 1.5 L normal saline bolus followed by maintenance fluids.  hold her diuretics for now and monitor renal function. Ultrasound abdomen shows worsening of medical renal disease. I have called her nephrologist and left a message. May need steroids if nephropathy associated with flare up of minimal change ds.  monitor I/O  Chest pain Symptoms appear atypical. Was recently hospitalist for similar symptoms. Follow troponins. 2-D echo recently was unremarkable. Continue aspirin  Essential hypertension Resume home blood pressure medication except for diuretics.  Anemia of chronic disease Stable  Diabetes mellitus A1c of 5.9. Not on any medications.monitor on SSI.     Diet: Renal  Code Status:full code Family Communication:none at bedside Disposition: Currently Obseration   Consultants:  none  Procedures:  Renal US  Antibiotics:  none  HPI/Subjective: C/o nausea . Denies further chest pain. repeat orthostasis shows elevated HR on  standing  Objective: Filed Vitals:   04/10/14 1230  BP:   Pulse: 72  Temp:   Resp: 18   No intake or output data in the 24 hours ending 04/10/14 1249 Filed Weights   04/10/14 0027  Weight: 108.863 kg (240 lb)    Exam:   General:  middle aged female in no acute distress  HEENT: No pallor, moist oral mucosa, supple neck   Cardiovascular:  S1 and S2, no murmurs   Respiratory: Clear to auscultation bilaterally    GI: Soft, nondistended, nontender Musculoskeletal: warm, no edema   Lab data Reviewed: Basic Metabolic Panel:  Recent Labs Lab 04/10/14 0025 04/10/14 0837 04/10/14 1125  NA 135 138  --   K 3.4* 3.5  --   CL 102 105  --   CO2 25 28  --   GLUCOSE 98 96  --   BUN 31* 30*  --   CREATININE 3.54* 3.61* 3.78*  CALCIUM 8.6 8.1*  --    Liver Function Tests:  Recent Labs Lab 04/10/14 0837  AST 16  ALT 12  ALKPHOS 38*  BILITOT 0.3  PROT 5.2*  ALBUMIN 1.5*   No results for input(s): LIPASE, AMYLASE in the last 168 hours. No results for input(s): AMMONIA in the last 168 hours. CBC:  Recent Labs Lab 04/10/14 0025 04/10/14 1125  WBC 7.7 5.6  NEUTROABS 5.0 3.5  HGB 8.9* 8.6*  HCT 28.3* 27.1*  MCV 76.9* 76.3*  PLT 491* 395   Cardiac Enzymes:  Recent Labs Lab 04/10/14 0837  TROPONINI <0.03   BNP (last 3 results)  Recent Labs  02/19/14 2136 04/10/14 0025  BNP 69.9 9.6    ProBNP (last 3 results)  Recent Labs  06/29/13 1426 08/21/13 0739  PROBNP 313.9* 358.8*    CBG: No results for input(s): GLUCAP in the last 168 hours.  No results found for this or any previous visit (from the past 240 hour(s)).   Studies: Dg Chest 2 View  04/10/2014   CLINICAL DATA:  Mid chest pain and right lateral chest pain for 3 days. Cough. Shortness of breath since Friday. Nausea and fatigue.  EXAM: CHEST  2 VIEW  COMPARISON:  03/09/2014  FINDINGS: The heart size and mediastinal contours are within normal limits. Both lungs are clear. The visualized  skeletal structures are unremarkable.  IMPRESSION: No active cardiopulmonary disease.   Electronically Signed   By: Lucienne Capers M.D.   On: 04/10/2014 01:19   US Renal  04/10/2014   CLINICAL DATA:  Elevated serum creatinine, now with right flank pain; history of chronic renal insufficiency.  EXAM: RENAL/URINARY TRACT ULTRASOUND COMPLETE  COMPARISON:  Abdominal ultrasound of January 25, 2014  FINDINGS: Right Kidney:  Length: 12.6 cm. The renal cortical echotexture is increased and is approximately equal to that of the adjacent liver. There is no focal mass or hydronephrosis.  Left Kidney:  Length: 12.3 cm. The renal cortical echotexture is increased diffusely. There is no hydronephrosis.  Bladder:  Appears normal for degree of bladder distention.  IMPRESSION: Increased renal cortical echotexture consistent with medical renal disease. There is no hydronephrosis.   Electronically Signed   By: David  Martinique   On: 04/10/2014 09:21    Scheduled Meds: . amLODipine  10 mg Oral Daily  . aspirin  81 mg Oral Daily  . atorvastatin  40 mg Oral Daily  . heparin  5,000 Units Subcutaneous 3 times per day  . metoprolol tartrate  25 mg Oral BID  . pantoprazole  40 mg Oral Daily  . sodium chloride  3 mL Intravenous Q12H  . traZODone  50 mg Oral QHS   Continuous Infusions: . sodium chloride 100 mL/hr at 04/10/14 1108  . sodium chloride         Time spent: 25 minutes    Patricia [name]  [facility] Pager [phone] If 7PM-7AM, please contact night-coverage at [email or web address], password [email or web address] 04/10/2014, 12:49 PM  LOS: 0 days

## 2014-04-10 NOTE — H&P (Signed)
[facility] History and Physical  Lavora Brisbon BSJ:[id number] DOB: Oct 27, 1975 DOA: 04/10/2014  Referring physician: ER physician. PCP: Tereasa Coop, PA-C   Chief Complaint: Chest pain.  HPI: Patricia [name] is a 39 y.o. female with history of chronic kidney disease, minimal change disease being followed at [facility] presents to the ER because of chest pain. Patient states he has been anxious fellow last 2 days which is stabbing in nature lasting a few minutes at times. In addition patient has been stating that she has been feeling weak. Complains of right flank pain denies any dysuria or discharge is. Denies any nausea vomiting or diarrhea. Denies any shortness of breath. Patient's labs showed worsening of her creatinine from her baseline. Patient also was mildly orthostatic. Chest x-ray EKG and cardiac markers were negative.   Review of Systems: As presented in the history of presenting illness, rest negative.  Past Medical History  Diagnosis Date  . Hypertension   . Acid reflux   . High cholesterol   . History of blood transfusion "a couple"    "related to low counts"  . Headache(784.0)     "related to chemo; sometimes weekly" (09/12/2013)  . Type II diabetes mellitus     "took me off my RX ~ 04/2013"  . Iron deficiency anemia     "get epogen shots q month" (02/20/2014)  . Chronic kidney disease (CKD), stage III (moderate)   . 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 Ankle fracture surgery Right 1994  . Abdominal hysterectomy  2010    "laparoscopic"  . Fracture surgery    . Cardiac catheterization  2000's   Social History:  reports that she has never smoked. She has never used smokeless tobacco. She reports that she does not drink alcohol or use illicit drugs. Where does patient live home.  Can patient participate in ADLs? Yes.   Allergies  Allergen  Reactions  . Nsaids Other (See Comments)    Cannot take due to Kidney disease    Family History:  Family History  Problem Relation Age of Onset  . Hypertension Mother   . Thyroid disease Mother   . Coronary artery disease Father   . Hypertension Father   . Diabetes Father       Prior to Admission medications   Medication Sig Start Date End Date Taking? Authorizing Provider  amLODipine (NORVASC) 10 MG tablet Take 10 mg by mouth daily.   Yes Historical Provider, MD  aspirin 81 MG chewable tablet Chew 1 tablet (81 mg total) by mouth daily. 10/03/13  Yes Eugenie Filler, MD  atorvastatin (LIPITOR) 40 MG tablet Take 40 mg by mouth daily. 03/14/14  Yes Historical Provider, MD  HYDROcodone-acetaminophen (NORCO/VICODIN) 5-325 MG per tablet Take 1-2 tablets by mouth every 6 (six) hours as needed for moderate pain. 01/25/14  Yes Fredia Sorrow, MD  lisinopril (PRINIVIL,ZESTRIL) 10 MG tablet Take 10 mg by mouth daily.   Yes Historical Provider, MD  metolazone (ZAROXOLYN) 2.5 MG tablet Take 2.5 mg by mouth daily. 03/27/14  Yes Historical Provider, MD  metoprolol tartrate (LOPRESSOR) 25 MG tablet Take 1 tablet (25 mg total) by mouth 2 (two) times daily. 10/03/13  Yes Eugenie Filler, MD  Multiple Vitamin (MULTIVITAMIN WITH MINERALS) TABS tablet Take 1 tablet by mouth daily.   Yes Historical Provider, MD  omeprazole (PRILOSEC) 40 MG  capsule Take 40 mg by mouth daily.     Yes Historical Provider, MD  promethazine (PHENERGAN) 25 MG tablet Take 1 tablet (25 mg total) by mouth every 6 (six) hours as needed for nausea or vomiting. 09/14/13  Yes Theodis Blaze, MD  SUMAtriptan (IMITREX) 50 MG tablet Take 50 mg by mouth 2 (two) times daily as needed for migraine. May repeat in 2 hours if headache persists or recurs.   Yes Historical Provider, MD  torsemide (DEMADEX) 20 MG tablet Take 80 mg by mouth daily. 09/07/13  Yes Historical Provider, MD  traZODone (DESYREL) 50 MG tablet Take 50 mg by mouth at bedtime.   Yes  Historical Provider, MD  vitamin C (ASCORBIC ACID) 500 MG tablet Take 500 mg by mouth daily.   Yes Historical Provider, MD  nitroGLYCERIN (NITROSTAT) 0.4 MG SL tablet Place 1 tablet (0.4 mg total) under the tongue every 5 (five) minutes as needed for chest pain. Patient not taking: Reported on 04/10/2014 10/03/13   Eugenie Filler, MD  potassium chloride SA (K-DUR,KLOR-CON) 20 MEQ tablet Take 1 tablet (20 mEq total) by mouth daily. Patient not taking: Reported on 04/10/2014 03/10/14   Kinnie Feil, MD    Physical Exam: Filed Vitals:   04/10/14 9470 04/10/14 0348 04/10/14 0356 04/10/14 0519  BP: 123/66   118/64  Pulse:  60 69 78  Temp:      TempSrc:      Resp:  $Remo'13 11 17  'IhDwX$ Height:      Weight:      SpO2:  100% 100% 100%     General: e obese not in distress.   EYEs: Anicteric no pallor.   ENT:  no discharge from the ears eyes nose and mouth.   Neck:  No mass felt.  Cardiovascular:S1-S2 heard.  Respiratory: No rhonchi or crepitations.  Abdomen: Mild flank tenderness on the right side.  Musculoskeletal: No edema.   Psychiatric: Appears normal.  Neurologic: Alert awake oriented to time place and person. Moves all extremities.  Labs on Admission:  Basic Metabolic Panel:  Recent Labs Lab 04/10/14 0025  NA 135  K 3.4*  CL 102  CO2 25  GLUCOSE 98  BUN 31*  CREATININE 3.54*  CALCIUM 8.6   Liver Function Tests: No results for input(s): AST, ALT, ALKPHOS, BILITOT, PROT, ALBUMIN in the last 168 hours. No results for input(s): LIPASE, AMYLASE in the last 168 hours. No results for input(s): AMMONIA in the last 168 hours. CBC:  Recent Labs Lab 04/10/14 0025  WBC 7.7  NEUTROABS 5.0  HGB 8.9*  HCT 28.3*  MCV 76.9*  PLT 491*   Cardiac Enzymes: No results for input(s): CKTOTAL, CKMB, CKMBINDEX, TROPONINI in the last 168 hours.  BNP (last 3 results)  Recent Labs  02/19/14 2136 04/10/14 0025  BNP 69.9 9.6    ProBNP (last 3 results)  Recent Labs   06/29/13 1426 08/21/13 0739  PROBNP 313.9* 358.8*    CBG: No results for input(s): GLUCAP in the last 168 hours.  Radiological Exams on Admission: Dg Chest 2 View  04/10/2014   CLINICAL DATA:  Mid chest pain and right lateral chest pain for 3 days. Cough. Shortness of breath since Friday. Nausea and fatigue.  EXAM: CHEST  2 VIEW  COMPARISON:  03/09/2014  FINDINGS: The heart size and mediastinal contours are within normal limits. Both lungs are clear. The visualized skeletal structures are unremarkable.  IMPRESSION: No active cardiopulmonary disease.   Electronically Signed  By: Lucienne Capers M.D.   On: 04/10/2014 01:19    EKG: Independently reviewed. Normal sinus rhythm with nonspecific T-wave changes comparable to the old EKG.  Assessment/Plan Principal Problem:   Acute on chronic renal failure Active Problems:   HTN (hypertension)   Chest pain   Renal failure (ARF), acute on chronic   1. Acute on chronic kidney disease - patient is mildly orthostatic in the ER. Patient did receive 500 mL normal saline bolus. We will recheck orthostatics and metabolic panel. At this time I'm holding off patient's diuretics may discuss with patient's nephrologist and [facility] for further recommendations. Closely follow intake output and metabolic panel and since patient is complaining of right flank pain we will check renal sonogram. 2. Chest pain appears atypical - was recently admitted for similar complaints. 2-D echo was unremarkable at that time. We will cycle cardiac markers. Aspirin. 3. Hypertension - continue home medications. 4. Hyperlipidemia - continue present medications. 5. Chronic anemia - follow CBC.   DVT Prophylaxis heparin.  Code Status: Full code.  Family Communication: None.  Disposition Plan: Admit for observation.    Katia Hannen N. [facility] Pager [phone].  If 7PM-7AM, please contact night-coverage [email or web address] Password [email or web address] 04/10/2014, 7:04  AM

## 2014-04-10 NOTE — ED Provider Notes (Signed)
CSN: [id number]     Arrival date & time 04/10/14  0008 History   First MD Initiated Contact with Patient 04/10/14 0430     Chief Complaint  Patient presents with  . Chest Pain    The patient said she has been having chest pain, SOB since Friday.  She has also been nauseated, and fatigued.       (Consider location/radiation/quality/duration/timing/severity/associated sxs/prior Treatment) Patient is a 39 y.o. female presenting with chest pain. The history is provided by the patient.  Chest Pain She comes in with chest pains for the last 3 days. She has difficulty describing the pains but they're left-sided and will last for about 1 minute before resolving. There is no particular pattern for them. Nothing makes them better nothing makes them worse. There is associated dyspnea, nausea and she has had a couple of episodes of diaphoresis. Pains are similar to what she had when she was admitted to the hospital about one month ago. She has a history of chronic kidney disease and hypokalemia. She has noted her heart has been skipping beats.   Past Medical History  Diagnosis Date  . Hypertension   . Acid reflux   . High cholesterol   . History of blood transfusion "a couple"    "related to low counts"  . Headache(784.0)     "related to chemo; sometimes weekly" (09/12/2013)  . Type II diabetes mellitus     "took me off my RX ~ 04/2013"  . Iron deficiency anemia     "get epogen shots q month" (02/20/2014)  . Chronic kidney disease (CKD), stage III (moderate)   . 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 Ankle fracture surgery Right 1994  . Abdominal hysterectomy  2010    "laparoscopic"  . Fracture surgery    . Cardiac catheterization  2000's   Family History  Problem Relation Age of Onset  . Hypertension Mother   . Thyroid disease Mother   . Coronary artery disease Father   .  Hypertension Father   . Diabetes Father    History  Substance Use Topics  . Smoking status: Never Smoker   . Smokeless tobacco: Never Used  . Alcohol Use: No   OB History    Gravida Para Term Preterm AB TAB SAB Ectopic Multiple Living   3        3      Review of Systems  Cardiovascular: Positive for chest pain.  All other systems reviewed and are negative.     Allergies  Nsaids  Home Medications   Prior to Admission medications   Medication Sig Start Date End Date Taking? Authorizing Provider  amLODipine (NORVASC) 10 MG tablet Take 10 mg by mouth daily.   Yes Historical Provider, MD  aspirin 81 MG chewable tablet Chew 1 tablet (81 mg total) by mouth daily. 10/03/13  Yes Eugenie Filler, MD  atorvastatin (LIPITOR) 40 MG tablet Take 40 mg by mouth daily. 03/14/14  Yes Historical Provider, MD  HYDROcodone-acetaminophen (NORCO/VICODIN) 5-325 MG per tablet Take 1-2 tablets by mouth every 6 (six) hours as needed for moderate pain. 01/25/14  Yes Fredia Sorrow, MD  lisinopril (PRINIVIL,ZESTRIL) 10 MG tablet Take 10 mg by mouth daily.   Yes Historical Provider, MD  metolazone (ZAROXOLYN) 2.5 MG tablet Take 2.5 mg by mouth daily. 03/27/14  Yes Historical Provider, MD  metoprolol tartrate (LOPRESSOR) 25 MG tablet Take 1 tablet (25 mg total) by mouth 2 (two) times daily. 10/03/13  Yes Eugenie Filler, MD  Multiple Vitamin (MULTIVITAMIN WITH MINERALS) TABS tablet Take 1 tablet by mouth daily.   Yes Historical Provider, MD  omeprazole (PRILOSEC) 40 MG capsule Take 40 mg by mouth daily.     Yes Historical Provider, MD  promethazine (PHENERGAN) 25 MG tablet Take 1 tablet (25 mg total) by mouth every 6 (six) hours as needed for nausea or vomiting. 09/14/13  Yes Theodis Blaze, MD  SUMAtriptan (IMITREX) 50 MG tablet Take 50 mg by mouth 2 (two) times daily as needed for migraine. May repeat in 2 hours if headache persists or recurs.   Yes Historical Provider, MD  torsemide (DEMADEX) 20 MG tablet  Take 80 mg by mouth daily. 09/07/13  Yes Historical Provider, MD  traZODone (DESYREL) 50 MG tablet Take 50 mg by mouth at bedtime.   Yes Historical Provider, MD  vitamin C (ASCORBIC ACID) 500 MG tablet Take 500 mg by mouth daily.   Yes Historical Provider, MD  nitroGLYCERIN (NITROSTAT) 0.4 MG SL tablet Place 1 tablet (0.4 mg total) under the tongue every 5 (five) minutes as needed for chest pain. Patient not taking: Reported on 04/10/2014 10/03/13   Eugenie Filler, MD  potassium chloride SA (K-DUR,KLOR-CON) 20 MEQ tablet Take 1 tablet (20 mEq total) by mouth daily. Patient not taking: Reported on 04/10/2014 03/10/14   Kinnie Feil, MD   BP 123/66 mmHg  Pulse 69  Temp(Src) 98.6 F (37 C) (Oral)  Resp 11  Ht $R'5\' 7"'NI$  (1.702 m)  Wt 240 lb (108.863 kg)  BMI 37.58 kg/m2  SpO2 100% Physical Exam  Nursing note and vitals reviewed.  39 year old female, resting comfortably and in no acute distress. Vital signs are normal. Oxygen saturation is 100%, which is normal. Head is normocephalic and atraumatic. PERRLA, EOMI. Oropharynx is clear. Neck is nontender and supple without adenopathy or JVD. Back is nontender and there is no CVA tenderness. Lungs are clear without rales, wheezes, or rhonchi. Chest is nontender. Heart has regular rate and rhythm without murmur. Abdomen is soft, flat, nontender without masses or hepatosplenomegaly and peristalsis is normoactive. Extremities have no cyanosis or edema, full range of motion is present. Skin is warm and dry without rash. Neurologic: Mental status is normal, cranial nerves are intact, there are no motor or sensory deficits.  ED Course  Procedures (including critical care time) Labs Review Results for orders placed or performed during the hospital encounter of 04/10/14  BNP (order ONLY if patient complains of dyspnea/SOB AND you have documented it for THIS visit)  Result Value Ref Range   B Natriuretic Peptide 9.6 0.0 - 100.0 pg/mL  Basic metabolic  panel  Result Value Ref Range   Sodium 135 135 - 145 mmol/L   Potassium 3.4 (L) 3.5 - 5.1 mmol/L   Chloride 102 96 - 112 mmol/L   CO2 25 19 - 32 mmol/L   Glucose, Bld 98 70 - 99 mg/dL   BUN 31 (H) 6 - 23 mg/dL   Creatinine, Ser 3.54 (H) 0.50 - 1.10 mg/dL   Calcium 8.6 8.4 - 10.5 mg/dL   GFR calc non Af Amer 15 (L) >90 mL/min   GFR calc Af Amer 17 (L) >90 mL/min   Anion gap 8 5 - 15  CBC with Differential  Result Value Ref Range   WBC 7.7 4.0 - 10.5 K/uL  RBC 3.68 (L) 3.87 - 5.11 MIL/uL   Hemoglobin 8.9 (L) 12.0 - 15.0 g/dL   HCT 28.3 (L) 36.0 - 46.0 %   MCV 76.9 (L) 78.0 - 100.0 fL   MCH 24.2 (L) 26.0 - 34.0 pg   MCHC 31.4 30.0 - 36.0 g/dL   RDW 14.3 11.5 - 15.5 %   Platelets 491 (H) 150 - 400 K/uL   Neutrophils Relative % 65 43 - 77 %   Neutro Abs 5.0 1.7 - 7.7 K/uL   Lymphocytes Relative 26 12 - 46 %   Lymphs Abs 2.0 0.7 - 4.0 K/uL   Monocytes Relative 6 3 - 12 %   Monocytes Absolute 0.5 0.1 - 1.0 K/uL   Eosinophils Relative 2 0 - 5 %   Eosinophils Absolute 0.2 0.0 - 0.7 K/uL   Basophils Relative 1 0 - 1 %   Basophils Absolute 0.0 0.0 - 0.1 K/uL  I-stat troponin, ED (not at [facility])  Result Value Ref Range   Troponin i, poc 0.00 0.00 - 0.08 ng/mL   Comment 3           Imaging Review Dg Chest 2 View  04/10/2014   CLINICAL DATA:  Mid chest pain and right lateral chest pain for 3 days. Cough. Shortness of breath since Friday. Nausea and fatigue.  EXAM: CHEST  2 VIEW  COMPARISON:  03/09/2014  FINDINGS: The heart size and mediastinal contours are within normal limits. Both lungs are clear. The visualized skeletal structures are unremarkable.  IMPRESSION: No active cardiopulmonary disease.   Electronically Signed   By: Lucienne Capers M.D.   On: 04/10/2014 01:19     EKG Interpretation   Date/Time:  Tuesday April 10 2014 00:16:13 EDT Ventricular Rate:  73 PR Interval:  150 QRS Duration: 88 QT Interval:  392 QTC Calculation: 431 R Axis:   17 Text Interpretation:  Normal  sinus rhythm Minimal voltage criteria for  LVH, may be normal variant Nonspecific T wave abnormality Abnormal ECG  When compared with ECG of 03/09/2014, No significant change was found  Confirmed by [facility]  MD, Ronnetta Currington ([id number]) on 04/10/2014 3:40:06 AM      MDM   Final diagnoses:  Acute on chronic renal failure  Atypical chest pain  Hypokalemia    Atypical chest pain. Old records are reviewed and she had been admitted to the hospital one month ago for atypical chest pain and hypokalemia. Cardiology consultation related results of prior workup and felt pain was not cardiac in nature. Workup today shows unchanged ECG and normal troponin but she is noted to have a bump in her creatinine from 2.2-3.54. Therefore, she will need to be admitted for evaluation of her worsening renal failure. She states she's never had creatinine this eye. Case is discussed with Dr. Hal Hope of [facility] who agrees to admit the patient.    Delora Fuel, MD 29/92/42 [id number]

## 2014-04-10 NOTE — ED Notes (Signed)
Ordered renal diet breakfast tray with 1200 ml restriction for pt.

## 2014-04-11 DIAGNOSIS — I10 Essential (primary) hypertension: Secondary | ICD-10-CM | POA: Diagnosis not present

## 2014-04-11 DIAGNOSIS — N183 Chronic kidney disease, stage 3 (moderate): Secondary | ICD-10-CM | POA: Diagnosis not present

## 2014-04-11 DIAGNOSIS — N059 Unspecified nephritic syndrome with unspecified morphologic changes: Secondary | ICD-10-CM | POA: Diagnosis not present

## 2014-04-11 DIAGNOSIS — N179 Acute kidney failure, unspecified: Secondary | ICD-10-CM | POA: Diagnosis not present

## 2014-04-11 DIAGNOSIS — N189 Chronic kidney disease, unspecified: Secondary | ICD-10-CM | POA: Diagnosis not present

## 2014-04-11 LAB — BASIC METABOLIC PANEL
ANION GAP: 12 (ref 5–15)
BUN: 30 mg/dL — AB (ref 6–23)
CO2: 19 mmol/L (ref 19–32)
Calcium: 7.8 mg/dL — ABNORMAL LOW (ref 8.4–10.5)
Chloride: 107 mmol/L (ref 96–112)
Creatinine, Ser: 3.99 mg/dL — ABNORMAL HIGH (ref 0.50–1.10)
GFR calc Af Amer: 15 mL/min — ABNORMAL LOW (ref >90)
GFR calc non Af Amer: 13 mL/min — ABNORMAL LOW (ref >90)
Glucose, Bld: 108 mg/dL — ABNORMAL HIGH (ref 70–99)
POTASSIUM: 3.8 mmol/L (ref 3.5–5.1)
SODIUM: 138 mmol/L (ref 135–145)

## 2014-04-11 NOTE — Progress Notes (Signed)
UR completed 

## 2014-04-11 NOTE — Progress Notes (Addendum)
[facility] PROGRESS NOTE  Patricia [name] GEX:[id number] DOB: 1975/02/26 DOA: 04/10/2014 PCP: Hillis Range  Brief narrative: 39 year old female with history of chronic kidney disease  stage 3, secondary to nephrotic syndrome ( follows with Dr Leslee Home at [facility]) with baseline creatine around 1.4 who was  hospitalized in February  this year with worsened renal function and her diuretics were held. She saw her nephrologist 4 weeks back at that time had severe leg edema and was resumed back on torsemide and metolazone. Vision presents with chest pain symptoms and weakness with poor appetite and nausea for the past few days. She was orthostatic in the ED with marked tachycardia on standing up.  Also noted for worsened renal function. admitted hospitalist service     Assessment/Plan:  acute on chronic kidney disease stage III  worsening of her ?nephrotic syndrome.  Received IV fluids for orthostasis on admission. Renal function worsening progressively. Discussed today with her nephrologist Dr. 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 She has fluctuating episodes of dehydration followed by severe volume overload in a very short span of time . Patient was discharged on 3/5 from the hospital when admitted for dehydration and her diuretic was held for a few days. When she  saw her nephrologist on 3/9 she had severe volume overload with leg edema. Her nephrologist is hesitant to start her on steroid for possible nephrotic syndrome as she has high risk for having a full-blown diabetes. Also informed that it has been very tough to control  her fluid status and renal function . -Recommends to  follow her renal function and if stable to discharge her on at least low-dose diuretic and he will follow up with her as outpatient ( possibly  within 2 weeks)  Chest pain Atypical. Symptoms resolved. Serial troponins negative. 2-D echo unremarkable.  Essential hypertension Continue home blood pressure medication except for diuretics.  Anemia of chronic disease Stable  Diabetes mellitus A1c of 5.9. Not on any medications.     Diet: Renal  Code Status:full code Family Communication:none at bedside Disposition: Currently Obseration   Consultants:  none  Procedures:  Renal US  Antibiotics:  none  HPI/Subjective: Denies further chest pain. Stable on monitor  Objective: Filed Vitals:   04/11/14 1001  BP: 115/62  Pulse: 73  Temp:   Resp:     Intake/Output Summary (Last 24 hours) at 04/11/14 1215 Last data filed at 04/11/14 1109  Gross per 24 hour  Intake    603 ml  Output      0 ml  Net    603 ml   Filed Weights   04/10/14 0027 04/10/14 2035 04/11/14 0500  Weight: 108.863 kg (240 lb) 106.505 kg (234 lb 12.8 oz) 106.505 kg (234 lb 12.8 oz)    Exam:   General:   no acute distress  HEENT: No pallor, moist oral mucosa, supple neck   Cardiovascular:  S1 and S2, no murmurs   Respiratory: Clear to auscultation bilaterally    GI: Soft, nondistended, nontender Musculoskeletal: warm, no edema   Lab data Reviewed: Basic Metabolic Panel:  Recent Labs Lab 04/10/14 0025 04/10/14 0837 04/10/14 1125 04/11/14 1000  NA 135 138  --  138  K 3.4* 3.5  --  3.8  CL 102 105  --  107  CO2 25 28  --  19  GLUCOSE 98 96  --  108*  BUN 31* 30*  --  30*  CREATININE 3.54* 3.61* 3.78* 3.99*  CALCIUM 8.6 8.1*  --  7.8*   Liver Function Tests:  Recent Labs Lab 04/10/14 0837  AST 16  ALT 12  ALKPHOS 38*  BILITOT 0.3  PROT 5.2*  ALBUMIN 1.5*   No results for input(s): LIPASE, AMYLASE in the last 168 hours. No results for input(s): AMMONIA in the last 168 hours. CBC:  Recent Labs Lab 04/10/14 0025 04/10/14 1125  WBC 7.7 5.6  NEUTROABS 5.0 3.5  HGB 8.9* 8.6*  HCT 28.3* 27.1*  MCV 76.9*  76.3*  PLT 491* 395   Cardiac Enzymes:  Recent Labs Lab 04/10/14 0837 04/10/14 1410 04/10/14 1958  TROPONINI <0.03 <0.03 <0.03   BNP (last 3 results)  Recent Labs  02/19/14 2136 04/10/14 0025  BNP 69.9 9.6    ProBNP (last 3 results)  Recent Labs  06/29/13 1426 08/21/13 0739  PROBNP 313.9* 358.8*    CBG: No results for input(s): GLUCAP in the last 168 hours.  No results found for this or any previous visit (from the past 240 hour(s)).   Studies: Dg Chest 2 View  04/10/2014   CLINICAL DATA:  Mid chest pain and right lateral chest pain for 3 days. Cough. Shortness of breath since Friday. Nausea and fatigue.  EXAM: CHEST  2 VIEW  COMPARISON:  03/09/2014  FINDINGS: The heart size and mediastinal contours are within normal limits. Both lungs are clear. The visualized skeletal structures are unremarkable.  IMPRESSION: No active cardiopulmonary disease.   Electronically Signed   By: Lucienne Capers M.D.   On: 04/10/2014 01:19   US Renal  04/10/2014   CLINICAL DATA:  Elevated serum creatinine, now with right flank pain; history of chronic renal insufficiency.  EXAM: RENAL/URINARY TRACT ULTRASOUND COMPLETE  COMPARISON:  Abdominal ultrasound of January 25, 2014  FINDINGS: Right Kidney:  Length: 12.6 cm. The renal cortical echotexture is increased and is approximately equal to that of the adjacent liver. There is no focal mass or hydronephrosis.  Left Kidney:  Length: 12.3 cm. The renal cortical echotexture is increased diffusely. There is no hydronephrosis.  Bladder:  Appears normal for degree of bladder distention.  IMPRESSION: Increased renal cortical echotexture consistent with medical renal disease. There is no hydronephrosis.   Electronically Signed   By: David  Martinique   On: 04/10/2014 09:21    Scheduled Meds: . amLODipine  10 mg Oral Daily  . aspirin  81 mg Oral Daily  . atorvastatin  40 mg Oral Daily  . feeding supplement (NEPRO CARB STEADY)  237 mL Oral BID BM  . heparin   5,000 Units Subcutaneous 3 times per day  . metoprolol tartrate  25 mg Oral BID  . pantoprazole  40 mg Oral Daily  . sodium chloride  3 mL Intravenous Q12H  . traZODone  50 mg Oral QHS   Continuous Infusions:       Time spent: 25 minutes    Iva Posten  [facility] Pager [phone] If 7PM-7AM, please contact night-coverage at [email or web address], password [email or web address] 04/11/2014, 12:15 PM  LOS: 1 day

## 2014-04-11 NOTE — Progress Notes (Signed)
Pt refused bedtime blood sugar check.

## 2014-04-11 NOTE — Progress Notes (Signed)
Nutrition Brief Note  Patient identified on the Malnutrition Screening Tool (MST) Report.  Wt Readings from Last 15 Encounters:  04/11/14 234 lb 12.8 oz (106.505 kg)  03/09/14 246 lb 4.1 oz (111.7 kg)  02/21/14 244 lb 9.6 oz (110.95 kg)  11/20/13 240 lb (108.863 kg)  10/26/13 245 lb 11.2 oz (111.449 kg)  10/03/13 242 lb 11.2 oz (110.088 kg)  09/12/13 237 lb 9.6 oz (107.775 kg)  07/05/13 246 lb (111.585 kg)  07/01/13 258 lb 6.4 oz (117.209 kg)  05/24/13 240 lb (108.863 kg)  05/08/13 251 lb 7 oz (114.051 kg)  02/23/13 240 lb (108.863 kg)  02/01/13 258 lb 9.6 oz (117.3 kg)  05/28/12 266 lb (120.657 kg)  07/04/11 266 lb 8.6 oz (120.9 kg)    Body mass index is 36.77 kg/(m^2). Patient meets criteria for obesity based on current BMI.   Current diet order is regular, patient is consuming approximately 100% of meals at this time. Labs and medications reviewed.   No nutrition interventions warranted at this time. If nutrition issues arise, please consult RD.   Molli Barrows, RD, LDN, [place] Pager [phone] After Hours Pager [phone]

## 2014-04-12 DIAGNOSIS — N179 Acute kidney failure, unspecified: Secondary | ICD-10-CM | POA: Diagnosis present

## 2014-04-12 DIAGNOSIS — I10 Essential (primary) hypertension: Secondary | ICD-10-CM | POA: Diagnosis not present

## 2014-04-12 DIAGNOSIS — N059 Unspecified nephritic syndrome with unspecified morphologic changes: Secondary | ICD-10-CM

## 2014-04-12 DIAGNOSIS — N183 Chronic kidney disease, stage 3 unspecified: Secondary | ICD-10-CM | POA: Diagnosis present

## 2014-04-12 LAB — BASIC METABOLIC PANEL
Anion gap: 11 (ref 5–15)
BUN: 35 mg/dL — ABNORMAL HIGH (ref 6–23)
CALCIUM: 7.8 mg/dL — AB (ref 8.4–10.5)
CO2: 19 mmol/L (ref 19–32)
Chloride: 109 mmol/L (ref 96–112)
Creatinine, Ser: 3.96 mg/dL — ABNORMAL HIGH (ref 0.50–1.10)
GFR calc Af Amer: 15 mL/min — ABNORMAL LOW (ref >90)
GFR calc non Af Amer: 13 mL/min — ABNORMAL LOW (ref >90)
GLUCOSE: 101 mg/dL — AB (ref 70–99)
Potassium: 3.6 mmol/L (ref 3.5–5.1)
Sodium: 139 mmol/L (ref 135–145)

## 2014-04-12 MED ORDER — METOLAZONE 2.5 MG PO TABS: 2.5000 mg | ORAL_TABLET | Freq: Every day | ORAL | Status: DC | PRN

## 2014-04-12 MED ORDER — TORSEMIDE 20 MG PO TABS: 40.0000 mg | ORAL_TABLET | Freq: Every day | ORAL | Status: DC

## 2014-04-12 NOTE — Discharge Instructions (Signed)
Kidney Failure Kidney failure happens when the kidneys cannot remove waste and excess fluid that naturally builds up in your blood after your body breaks down food. This leads to a dangerous buildup of waste products and fluid in the blood. HOME CARE  Follow your diet as told by your doctor.  Take all medicines as told by your doctor.  Keep all of your dialysis appointments. Call if you are unable to keep an appointment. GET HELP RIGHT AWAY IF:   You make a lot more or very little pee (urine).  Your face or ankles puff up (swell).  You develop shortness of breath.  You develop weakness, feel tired, or you do not feel hungry (appetite loss).  You feel poorly for no known reason. MAKE SURE YOU:   Understand these instructions.  Will watch your condition.  Will get help right away if you are not doing well or get worse. Document Released: 03/18/2009 Document Revised: 03/16/2011 Document Reviewed: 04/24/2009 [facility] Patient Information 2015 [place], [place]. This information is not intended to replace advice given to you by your health care provider. Make sure you discuss any questions you have with your health care provider.   Cardiac Diet This diet can help prevent heart disease and stroke. Many factors influence your heart health, including eating and exercise habits. Coronary risk rises a lot with abnormal blood fat (lipid) levels. Cardiac meal planning includes limiting unhealthy fats, increasing healthy fats, and making other small dietary changes. General guidelines are as follows:  Adjust calorie intake to reach and maintain desirable body weight.  Limit total fat intake to less than 30% of total calories. Saturated fat should be less than 7% of calories.  Saturated fats are found in animal products and in some vegetable products. Saturated vegetable fats are found in coconut oil, cocoa butter, palm oil, and palm kernel oil. Read labels carefully to avoid these products as  much as possible. Use butter in moderation. Choose tub margarines and oils that have 2 grams of fat or less. Good cooking oils are canola and olive oils.  Practice low-fat cooking techniques. Do not fry food. Instead, broil, bake, boil, steam, grill, roast on a rack, stir-fry, or microwave it. Other fat reducing suggestions include:  Remove the skin from poultry.  Remove all visible fat from meats.  Skim the fat off stews, soups, and gravies before serving them.  Steam vegetables in water or broth instead of sauting them in fat.  Avoid foods with trans fat (or hydrogenated oils), such as commercially fried foods and commercially baked goods. Commercial shortening and deep-frying fats will contain trans fat.  Increase intake of fruits, vegetables, whole grains, and legumes to replace foods high in fat.  Increase consumption of nuts, legumes, and seeds to at least 4 servings weekly. One serving of a legume equals  cup, and 1 serving of nuts or seeds equals  cup.  Choose whole grains more often. Have 3 servings per day (a serving is 1 ounce [oz]).  Eat 4 to 5 servings of vegetables per day. A serving of vegetables is 1 cup of raw leafy vegetables;  cup of raw or cooked cut-up vegetables;  cup of vegetable juice.  Eat 4 to 5 servings of fruit per day. A serving of fruit is 1 medium whole fruit;  cup of dried fruit;  cup of fresh, frozen, or canned fruit;  cup of 100% fruit juice.  Increase your intake of dietary fiber to 20 to 30 grams per day.  Insoluble fiber may help lower your risk of heart disease and may help curb your appetite.  Soluble fiber binds cholesterol to be removed from the blood. Foods high in soluble fiber are dried beans, citrus fruits, oats, apples, bananas, broccoli, Brussels sprouts, and eggplant.  Try to include foods fortified with plant sterols or stanols, such as yogurt, breads, juices, or margarines. Choose several fortified foods to achieve a daily intake of  2 to 3 grams of plant sterols or stanols.  Foods with omega-3 fats can help reduce your risk of heart disease. Aim to have a 3.5 oz portion of fatty fish twice per week, such as salmon, mackerel, albacore tuna, sardines, lake trout, or herring. If you wish to take a fish oil supplement, choose one that contains 1 gram of both DHA and EPA.  Limit processed meats to 2 servings (3 oz portion) weekly.  Limit the sodium in your diet to 1500 milligrams (mg) per day. If you have high blood pressure, talk to a registered dietitian about a DASH (Dietary Approaches to Stop Hypertension) eating plan.  Limit sweets and beverages with added sugar, such as soda, to no more than 5 servings per week. One serving is:   1 tablespoon sugar.  1 tablespoon jelly or jam.   cup sorbet.  1 cup lemonade.   cup regular soda. CHOOSING FOODS Starches  Allowed: Breads: All kinds (wheat, rye, raisin, white, oatmeal, [place], [place], and English muffin bread). Low-fat rolls: English muffins, frankfurter and hamburger buns, bagels, pita bread, tortillas (not fried). Pancakes, waffles, biscuits, and muffins made with recommended oil.  Avoid: Products made with saturated or trans fats, oils, or whole milk products. Butter rolls, cheese breads, croissants. Commercial doughnuts, muffins, sweet rolls, biscuits, waffles, pancakes, store-bought mixes. Crackers  Allowed: Low-fat crackers and snacks: Animal, graham, rye, saltine (with recommended oil, no lard), oyster, and matzo crackers. Bread sticks, melba toast, rusks, flatbread, pretzels, and light popcorn.  Avoid: High-fat crackers: cheese crackers, butter crackers, and those made with coconut, palm oil, or trans fat (hydrogenated oils). Buttered popcorn. Cereals  Allowed: Hot or cold whole-grain cereals.  Avoid: Cereals containing coconut, hydrogenated vegetable fat, or animal fat. Potatoes / Pasta / Rice  Allowed: All kinds of potatoes, rice, and pasta (such  as macaroni, spaghetti, and noodles).  Avoid: Pasta or rice prepared with cream sauce or high-fat cheese. Chow mein noodles, [place] fries. Vegetables  Allowed: All vegetables and vegetable juices.  Avoid: Fried vegetables. Vegetables in cream, butter, or high-fat cheese sauces. Limit coconut. Fruit in cream or custard. Protein  Allowed: Limit your intake of meat, seafood, and poultry to no more than 6 oz (cooked weight) per day. All lean, well-trimmed beef, veal, pork, and lamb. All chicken and [place] without skin. All fish and shellfish. Wild game: wild duck, rabbit, pheasant, and venison. Egg whites or low-cholesterol egg substitutes may be used as desired. Meatless dishes: recipes with dried beans, peas, lentils, and tofu (soybean curd). Seeds and nuts: all seeds and most nuts.  Avoid: Prime grade and other heavily marbled and fatty meats, such as short ribs, spare ribs, rib eye roast or steak, frankfurters, sausage, bacon, and high-fat luncheon meats, mutton. Caviar. Commercially fried fish. Domestic duck, goose, venison sausage. Organ meats: liver, gizzard, heart, chitterlings, brains, kidney, sweetbreads. Dairy  Allowed: Low-fat cheeses: nonfat or low-fat cottage cheese (1% or 2% fat), cheeses made with part skim milk, such as mozzarella, farmers, string, or ricotta. (Cheeses should be labeled no more than 2 to  6 grams fat per oz.). Skim (or 1%) milk: liquid, powdered, or evaporated. Buttermilk made with low-fat milk. Drinks made with skim or low-fat milk or cocoa. Chocolate milk or cocoa made with skim or low-fat (1%) milk. Nonfat or low-fat yogurt.  Avoid: Whole milk cheeses, including colby, cheddar, mu[place], Monterey Jack, [place], [place], [place], American, Swiss, and blue. Creamed cottage cheese, cream cheese. Whole milk and whole milk products, including buttermilk or yogurt made from whole milk, drinks made from whole milk. Condensed milk, evaporated whole milk, and 2% milk. Soups  and Combination Foods  Allowed: Low-fat low-sodium soups: broth, dehydrated soups, homemade broth, soups with the fat removed, homemade cream soups made with skim or low-fat milk. Low-fat spaghetti, lasagna, chili, and Spanish rice if low-fat ingredients and low-fat cooking techniques are used.  Avoid: Cream soups made with whole milk, cream, or high-fat cheese. All other soups. Desserts and Sweets  Allowed: Sherbet, fruit ices, gelatins, meringues, and angel food cake. Homemade desserts with recommended fats, oils, and milk products. Jam, jelly, honey, marmalade, sugars, and syrups. Pure sugar candy, such as gum drops, hard candy, jelly beans, marshmallows, mints, and small amounts of dark chocolate.  Avoid: Commercially prepared cakes, pies, cookies, frosting, pudding, or mixes for these products. Desserts containing whole milk products, chocolate, coconut, lard, palm oil, or palm kernel oil. Ice cream or ice cream drinks. Candy that contains chocolate, coconut, butter, hydrogenated fat, or unknown ingredients. Buttered syrups. Fats and Oils  Allowed: Vegetable oils: safflower, sunflower, corn, soybean, cottonseed, sesame, canola, olive, or peanut. Non-hydrogenated margarines. Salad dressing or mayonnaise: homemade or commercial, made with a recommended oil. Low or nonfat salad dressing or mayonnaise.  Limit added fats and oils to 6 to 8 tsp per day (includes fats used in cooking, baking, salads, and spreads on bread). Remember to count the "hidden fats" in foods.  Avoid: Solid fats and shortenings: butter, lard, salt pork, bacon drippings. Gravy containing meat fat, shortening, or suet. Cocoa butter, coconut. Coconut oil, palm oil, palm kernel oil, or hydrogenated oils: these ingredients are often used in bakery products, nondairy creamers, whipped toppings, candy, and commercially fried foods. Read labels carefully. Salad dressings made of unknown oils, sour cream, or cheese, such as blue cheese  and Roquefort. Cream, all kinds: half-and-half, light, heavy, or whipping. Sour cream or cream cheese (even if "light" or low-fat). Nondairy cream substitutes: coffee creamers and sour cream substitutes made with palm, palm kernel, hydrogenated oils, or coconut oil. Beverages  Allowed: Coffee (regular or decaffeinated), tea. Diet carbonated beverages, mineral water. Alcohol: Check with your caregiver. Moderation is recommended.  Avoid: Whole milk, regular sodas, and juice drinks with added sugar. Condiments  Allowed: All seasonings and condiments. Cocoa powder. "Cream" sauces made with recommended ingredients.  Avoid: Carob powder made with hydrogenated fats. SAMPLE MENU Breakfast   cup orange juice   cup oatmeal  1 slice toast  1 tsp margarine  1 cup skim milk Lunch  [place] sandwich with 2 oz [place], 2 slices bread  Lettuce and tomato slices  Fresh fruit  Carrot sticks  Coffee or tea Snack  Fresh fruit or low-fat crackers Dinner  3 oz lean ground beef  1 baked potato  1 tsp margarine   cup asparagus  Lettuce salad  1 tbs non-creamy dressing   cup peach slices  1 cup skim milk Document Released: 10/01/2007 Document Revised: 06/23/2011 Document Reviewed: 02/21/2013 [employer] Patient Information 2015 [place], [place]. This information is not intended to replace advice given to  you by your health care provider. Make sure you discuss any questions you have with your health care provider.

## 2014-04-12 NOTE — Discharge Summary (Signed)
Physician Discharge Summary  Patricia [name] VEL:[id number] DOB: [birth year] DOA: 04/10/2014  PCP: Tereasa Coop, PA-C  Admit date: 04/10/2014 Discharge date: 04/12/2014  Time spent: <30 minutes  Recommendations for Outpatient Follow-up:   follow up with nephrologist at [facility]  next week.  Patient being discharged on 40 mg torsemide daily and metolazone as needed.    Discharge Diagnoses:  Principal Problem:   Acute renal failure superimposed on stage 3 chronic kidney disease  Active Problems:   Morbid obesity   HTN (hypertension)   Glomerulonephritis   Nephrotic syndrome   Chest pain, atypical   Discharge Condition: Fair  Diet recommendation: Renal  Filed Weights   04/10/14 2035 04/11/14 0500 04/12/14 0420  Weight: 106.505 kg (234 lb 12.8 oz) 106.505 kg (234 lb 12.8 oz) 106.641 kg (235 lb 1.6 oz)    History of present illness:  39 year old female with history of chronic kidney disease stage 3, secondary to nephrotic syndrome ( follows with Dr Leslee Home at [facility]) with baseline creatine around 1.4 who was hospitalized in February this year with worsened renal function and her diuretics were held. She saw her nephrologist 4 weeks back at that time had severe leg edema and was resumed back on torsemide and metolazone. Vision presents with chest pain symptoms and weakness with poor appetite and nausea for the past few days. She was orthostatic in the ED with marked tachycardia on standing up. Also noted for worsened renal function. admitted hospitalist service   Hospital Course:  acute on chronic kidney disease stage III worsening of her ?nephrotic syndrome. Received IV fluids for orthostasis on admission. Renal function worsening progressively. Discussed with her nephrologist Dr. Leslee Home at [facility] ( phone [phone], pager [phone]) who recommended that patient either has minimal change versus Focal segmental glomerulonephritis and possible vasculitis on her renal  biopsy. She has fluctuating episodes of dehydration followed by severe volume overload in a very short span of time . Patient was discharged on 3/5 from the hospital when admitted for dehydration and her diuretic was held for a few days. When she saw her nephrologist on 3/9 she had severe volume overload with leg edema. Her nephrologist is hesitant to start her on steroid for possible nephrotic syndrome as she has high risk for having a full-blown diabetes. Also informed that it has been very tough to control her fluid status and renal function . -As per our discussion I'm discharging her on half her dose of torsemide (40 mg daily) and metolazone as needed for leg swellings and/or shortness of breath. Patient has follow-up appointment with Dr. Ulice Dash in 1 week. Creatinine of 2.96 upon discharge.  Chest pain Atypical. Symptoms resolved. Serial troponins negative. 2-D echo unremarkable.  Essential hypertension Continue home blood pressure medication .  Anemia of chronic disease Stable  Diabetes mellitus A1c of 5.9. Not on any medications.       Code Status:full code  Family Communication:none at bedside  Disposition: Home with outpatient follow-up   Consultants:  None. Spoke with patient's nephrologist on the phone on 4/6  Procedures:  Renal US  Antibiotics:  none    Discharge Exam: Filed Vitals:   04/12/14 0420  BP: 102/52  Pulse: 70  Temp: 98.6 F (37 C)  Resp:     General: Middle aged obese female in no acute distress HEENT: No pallor, moist oral mucosa Chest: Clear to flexion bilaterally CVS: Normal S1 and S2, no murmurs rub or gallop GI: Soft, nondistended, nontender, bowel sounds present Musculoskeletal: Warm, no  edema CNS: Alert and oriented   Discharge Instructions    Current Discharge Medication List    CONTINUE these medications which have CHANGED   Details  metolazone (ZAROXOLYN) 2.5 MG tablet Take 1 tablet (2.5 mg total) by mouth daily  as needed (for leg swellings or shortness of breath). Qty: 2 tablet, Refills: 0    torsemide (DEMADEX) 20 MG tablet Take 2 tablets (40 mg total) by mouth daily. Qty: 30 tablet, Refills: 0      CONTINUE these medications which have NOT CHANGED   Details  amLODipine (NORVASC) 10 MG tablet Take 10 mg by mouth daily.    aspirin 81 MG chewable tablet Chew 1 tablet (81 mg total) by mouth daily.    atorvastatin (LIPITOR) 40 MG tablet Take 40 mg by mouth daily. Refills: 3    HYDROcodone-acetaminophen (NORCO/VICODIN) 5-325 MG per tablet Take 1-2 tablets by mouth every 6 (six) hours as needed for moderate pain. Qty: 20 tablet, Refills: 0    metoprolol tartrate (LOPRESSOR) 25 MG tablet Take 1 tablet (25 mg total) by mouth 2 (two) times daily. Qty: 62 tablet, Refills: 0    Multiple Vitamin (MULTIVITAMIN WITH MINERALS) TABS tablet Take 1 tablet by mouth daily.    omeprazole (PRILOSEC) 40 MG capsule Take 40 mg by mouth daily.      promethazine (PHENERGAN) 25 MG tablet Take 1 tablet (25 mg total) by mouth every 6 (six) hours as needed for nausea or vomiting. Qty: 65 tablet, Refills: 1    SUMAtriptan (IMITREX) 50 MG tablet Take 50 mg by mouth 2 (two) times daily as needed for migraine. May repeat in 2 hours if headache persists or recurs.    traZODone (DESYREL) 50 MG tablet Take 50 mg by mouth at bedtime.    vitamin C (ASCORBIC ACID) 500 MG tablet Take 500 mg by mouth daily.    potassium chloride SA (K-DUR,KLOR-CON) 20 MEQ tablet Take 1 tablet (20 mEq total) by mouth daily. Qty: 3 tablet, Refills: 0      STOP taking these medications     lisinopril (PRINIVIL,ZESTRIL) 10 MG tablet      nitroGLYCERIN (NITROSTAT) 0.4 MG SL tablet        Allergies  Allergen Reactions  . Nsaids Other (See Comments)    Cannot take due to Kidney disease   Follow-up Information    Follow up with LONG,ASHLEY B, PA-C In 2 weeks.   Specialty:  Physician Assistant   Contact information:   [place] [id number] [phone]       Follow up with Timoteo Ace, MD On 04/19/2014.   Specialty:  Nephrology   Why:  11 am   Contact information:   [place] [address] [phone]        The results of significant diagnostics from this hospitalization (including imaging, microbiology, ancillary and laboratory) are listed below for reference.    Significant Diagnostic Studies: Dg Chest 2 View  04/10/2014   CLINICAL DATA:  Mid chest pain and right lateral chest pain for 3 days. Cough. Shortness of breath since Friday. Nausea and fatigue.  EXAM: CHEST  2 VIEW  COMPARISON:  03/09/2014  FINDINGS: The heart size and mediastinal contours are within normal limits. Both lungs are clear. The visualized skeletal structures are unremarkable.  IMPRESSION: No active cardiopulmonary disease.   Electronically Signed   By: Lucienne Capers M.D.   On: 04/10/2014 01:19   US Renal  04/10/2014  CLINICAL DATA:  Elevated serum creatinine, now with right flank pain; history of chronic renal insufficiency.  EXAM: RENAL/URINARY TRACT ULTRASOUND COMPLETE  COMPARISON:  Abdominal ultrasound of January 25, 2014  FINDINGS: Right Kidney:  Length: 12.6 cm. The renal cortical echotexture is increased and is approximately equal to that of the adjacent liver. There is no focal mass or hydronephrosis.  Left Kidney:  Length: 12.3 cm. The renal cortical echotexture is increased diffusely. There is no hydronephrosis.  Bladder:  Appears normal for degree of bladder distention.  IMPRESSION: Increased renal cortical echotexture consistent with medical renal disease. There is no hydronephrosis.   Electronically Signed   By: David  Martinique   On: 04/10/2014 09:21    Microbiology: No results found for this or any previous visit (from the past 240 hour(s)).   Labs: Basic Metabolic Panel:  Recent Labs Lab 04/10/14 0025 04/10/14 0837 04/10/14 1125 04/11/14 1000 04/12/14 0356  NA 135 138  --   138 139  K 3.4* 3.5  --  3.8 3.6  CL 102 105  --  107 109  CO2 25 28  --  19 19  GLUCOSE 98 96  --  108* 101*  BUN 31* 30*  --  30* 35*  CREATININE 3.54* 3.61* 3.78* 3.99* 3.96*  CALCIUM 8.6 8.1*  --  7.8* 7.8*   Liver Function Tests:  Recent Labs Lab 04/10/14 0837  AST 16  ALT 12  ALKPHOS 38*  BILITOT 0.3  PROT 5.2*  ALBUMIN 1.5*   No results for input(s): LIPASE, AMYLASE in the last 168 hours. No results for input(s): AMMONIA in the last 168 hours. CBC:  Recent Labs Lab 04/10/14 0025 04/10/14 1125  WBC 7.7 5.6  NEUTROABS 5.0 3.5  HGB 8.9* 8.6*  HCT 28.3* 27.1*  MCV 76.9* 76.3*  PLT 491* 395   Cardiac Enzymes:  Recent Labs Lab 04/10/14 0837 04/10/14 1410 04/10/14 1958  TROPONINI <0.03 <0.03 <0.03   BNP: BNP (last 3 results)  Recent Labs  02/19/14 2136 04/10/14 0025  BNP 69.9 9.6    ProBNP (last 3 results)  Recent Labs  06/29/13 1426 08/21/13 0739  PROBNP 313.9* 358.8*    CBG: No results for input(s): GLUCAP in the last 168 hours.     SignedLouellen Molder  [facility] 04/12/2014, 9:00 AM

## 2014-05-01 ENCOUNTER — Emergency Department (HOSPITAL_COMMUNITY)
Admission: EM | Admit: 2014-05-01 | Discharge: 2014-05-01 | Disposition: A | Discharge status: Home / Self Care | Payer: Medicaid Other | Source: Home / Self Care | Attending: Family Medicine | Admitting: Family Medicine

## 2014-05-01 ENCOUNTER — Emergency Department (INDEPENDENT_AMBULATORY_CARE_PROVIDER_SITE_OTHER): Payer: Medicaid Other

## 2014-05-01 ENCOUNTER — Encounter (HOSPITAL_COMMUNITY): Payer: Self-pay | Admitting: Emergency Medicine

## 2014-05-01 DIAGNOSIS — R1084 Generalized abdominal pain: Secondary | ICD-10-CM | POA: Diagnosis not present

## 2014-05-01 DIAGNOSIS — M545 Low back pain, unspecified: Secondary | ICD-10-CM

## 2014-05-01 DIAGNOSIS — K5901 Slow transit constipation: Secondary | ICD-10-CM

## 2014-05-01 DIAGNOSIS — R300 Dysuria: Secondary | ICD-10-CM

## 2014-05-01 LAB — POCT URINALYSIS DIP (DEVICE)
BILIRUBIN URINE: NEGATIVE
Glucose, UA: 250 mg/dL — AB
KETONES UR: NEGATIVE mg/dL
LEUKOCYTES UA: NEGATIVE
NITRITE: NEGATIVE
Protein, ur: >=300 mg/dL — AB
SPECIFIC GRAVITY, URINE: 1.025 (ref 1.005–1.030)
Urobilinogen, UA: 0.2 mg/dL (ref 0.0–1.0)
pH: 7 (ref 5.0–8.0)

## 2014-05-01 MED ORDER — CEPHALEXIN 500 MG PO CAPS: 500.0000 mg | ORAL_CAPSULE | Freq: Four times a day (QID) | ORAL | Status: DC

## 2014-05-01 NOTE — Discharge Instructions (Signed)
Abdominal Pain Many things can cause abdominal pain. Usually, abdominal pain is not caused by a disease and will improve without treatment. It can often be observed and treated at home. Your health care provider will do a physical exam and possibly order blood tests and X-rays to help determine the seriousness of your pain. However, in many cases, more time must pass before a clear cause of the pain can be found. Before that point, your health care provider may not know if you need more testing or further treatment. HOME CARE INSTRUCTIONS  Monitor your abdominal pain for any changes. The following actions may help to alleviate any discomfort you are experiencing:  Only take over-the-counter or prescription medicines as directed by your health care provider.  Do not take laxatives unless directed to do so by your health care provider.  Try a clear liquid diet (broth, tea, or water) as directed by your health care provider. Slowly move to a bland diet as tolerated. SEEK MEDICAL CARE IF:  You have unexplained abdominal pain.  You have abdominal pain associated with nausea or diarrhea.  You have pain when you urinate or have a bowel movement.  You experience abdominal pain that wakes you in the night.  You have abdominal pain that is worsened or improved by eating food.  You have abdominal pain that is worsened with eating fatty foods.  You have a fever. SEEK IMMEDIATE MEDICAL CARE IF:   Your pain does not go away within 2 hours.  You keep throwing up (vomiting).  Your pain is felt only in portions of the abdomen, such as the right side or the left lower portion of the abdomen.  You pass bloody or black tarry stools. MAKE SURE YOU:  Understand these instructions.   Will watch your condition.   Will get help right away if you are not doing well or get worse.  Document Released: 10/01/2004 Document Revised: 12/27/2012 Document Reviewed: 08/31/2012 [facility] Patient Information  2015 [place], [place]. This information is not intended to replace advice given to you by your health care provider. Make sure you discuss any questions you have with your health care provider.  Back Pain, Adult Low back pain is very common. About 1 in 5 people have back pain.The cause of low back pain is rarely dangerous. The pain often gets better over time.About half of people with a sudden onset of back pain feel better in just 2 weeks. About 8 in 10 people feel better by 6 weeks.  CAUSES Some common causes of back pain include:  Strain of the muscles or ligaments supporting the spine.  Wear and tear (degeneration) of the spinal discs.  Arthritis.  Direct injury to the back. DIAGNOSIS Most of the time, the direct cause of low back pain is not known.However, back pain can be treated effectively even when the exact cause of the pain is unknown.Answering your caregiver's questions about your overall health and symptoms is one of the most accurate ways to make sure the cause of your pain is not dangerous. If your caregiver needs more information, he or she may order lab work or imaging tests (X-rays or MRIs).However, even if imaging tests show changes in your back, this usually does not require surgery. HOME CARE INSTRUCTIONS For many people, back pain returns.Since low back pain is rarely dangerous, it is often a condition that people can learn to [facility] their own.   Remain active. It is stressful on the back to sit or stand in  one place. Do not sit, drive, or stand in one place for more than 30 minutes at a time. Take short walks on level surfaces as soon as pain allows.Try to increase the length of time you walk each day.  Do not stay in bed.Resting more than 1 or 2 days can delay your recovery.  Do not avoid exercise or work.Your body is made to move.It is not dangerous to be active, even though your back may hurt.Your back will likely heal faster if you return to being active  before your pain is gone.  Pay attention to your body when you bend and lift. Many people have less discomfortwhen lifting if they bend their knees, keep the load close to their bodies,and avoid twisting. Often, the most comfortable positions are those that put less stress on your recovering back.  Find a comfortable position to sleep. Use a firm mattress and lie on your side with your knees slightly bent. If you lie on your back, put a pillow under your knees.  Only take over-the-counter or prescription medicines as directed by your caregiver. Over-the-counter medicines to reduce pain and inflammation are often the most helpful.Your caregiver may prescribe muscle relaxant drugs.These medicines help dull your pain so you can more quickly return to your normal activities and healthy exercise.  Put ice on the injured area.  Put ice in a plastic bag.  Place a towel between your skin and the bag.  Leave the ice on for 15-20 minutes, 03-04 times a day for the first 2 to 3 days. After that, ice and heat may be alternated to reduce pain and spasms.  Ask your caregiver about trying back exercises and gentle massage. This may be of some benefit.  Avoid feeling anxious or stressed.Stress increases muscle tension and can worsen back pain.It is important to recognize when you are anxious or stressed and learn ways to manage it.Exercise is a great option. SEEK MEDICAL CARE IF:  You have pain that is not relieved with rest or medicine.  You have pain that does not improve in 1 week.  You have new symptoms.  You are generally not feeling well. SEEK IMMEDIATE MEDICAL CARE IF:   You have pain that radiates from your back into your legs.  You develop new bowel or bladder control problems.  You have unusual weakness or numbness in your arms or legs.  You develop nausea or vomiting.  You develop abdominal pain.  You feel faint. Document Released: 12/22/2004 Document Revised: 06/23/2011  Document Reviewed: 04/25/2013 [facility] Patient Information 2015 [place], [place]. This information is not intended to replace advice given to you by your health care provider. Make sure you discuss any questions you have with your health care provider.  Constipation Constipation is when a person has fewer than three bowel movements a week, has difficulty having a bowel movement, or has stools that are dry, hard, or larger than normal. As people grow older, constipation is more common. If you try to fix constipation with medicines that make you have a bowel movement (laxatives), the problem may get worse. Long-term laxative use may cause the muscles of the colon to become weak. A low-fiber diet, not taking in enough fluids, and taking certain medicines may make constipation worse.  CAUSES   Certain medicines, such as antidepressants, pain medicine, iron supplements, antacids, and water pills.   Certain diseases, such as diabetes, irritable bowel syndrome (IBS), thyroid disease, or depression.   Not drinking enough water.   Not  eating enough fiber-rich foods.   Stress or travel.   Lack of physical activity or exercise.   Ignoring the urge to have a bowel movement.   Using laxatives too much.  SIGNS AND SYMPTOMS   Having fewer than three bowel movements a week.   Straining to have a bowel movement.   Having stools that are hard, dry, or larger than normal.   Feeling full or bloated.   Pain in the lower abdomen.   Not feeling relief after having a bowel movement.  DIAGNOSIS  Your health care provider will take a medical history and perform a physical exam. Further testing may be done for severe constipation. Some tests may include:  A barium enema X-ray to examine your rectum, colon, and, sometimes, your small intestine.   A sigmoidoscopy to examine your lower colon.   A colonoscopy to examine your entire colon. TREATMENT  Treatment will depend on the severity  of your constipation and what is causing it. Some dietary treatments include drinking more fluids and eating more fiber-rich foods. Lifestyle treatments may include regular exercise. If these diet and lifestyle recommendations do not help, your health care provider may recommend taking over-the-counter laxative medicines to help you have bowel movements. Prescription medicines may be prescribed if over-the-counter medicines do not work.  HOME CARE INSTRUCTIONS   Eat foods that have a lot of fiber, such as fruits, vegetables, whole grains, and beans.  Limit foods high in fat and processed sugars, such as french fries, hamburgers, cookies, candies, and soda.   A fiber supplement may be added to your diet if you cannot get enough fiber from foods.   Drink enough fluids to keep your urine clear or pale yellow.   Exercise regularly or as directed by your health care provider.   Go to the restroom when you have the urge to go. Do not hold it.   Only take over-the-counter or prescription medicines as directed by your health care provider. Do not take other medicines for constipation without talking to your health care provider first.  [place] IF:   You have bright red blood in your stool.   Your constipation lasts for more than 4 days or gets worse.   You have abdominal or rectal pain.   You have thin, pencil-like stools.   You have unexplained weight loss. MAKE SURE YOU:   Understand these instructions.  Will watch your condition.  Will get help right away if you are not doing well or get worse. Document Released: 09/20/2003 Document Revised: 12/27/2012 Document Reviewed: 10/03/2012 [facility] Patient Information 2015 [place], [place]. This information is not intended to replace advice given to you by your health care provider. Make sure you discuss any questions you have with your health care provider.  Dysuria Dysuria is the medical term for pain with  urination. There are many causes for dysuria, but urinary tract infection is the most common. If a urinalysis was performed it can show that there is a urinary tract infection. A urine culture confirms that you or your child is sick. You will need to follow up with a healthcare provider because:  If a urine culture was done you will need to know the culture results and treatment recommendations.  If the urine culture was positive, you or your child will need to be put on antibiotics or know if the antibiotics prescribed are the right antibiotics for your urinary tract infection.  If the urine culture is negative (no urinary  tract infection), then other causes may need to be explored or antibiotics need to be stopped. Today laboratory work may have been done and there does not seem to be an infection. If cultures were done they will take at least 24 to 48 hours to be completed. Today x-rays may have been taken and they read as normal. No cause can be found for the problems. The x-rays may be re-read by a radiologist and you will be contacted if additional findings are made. You or your child may have been put on medications to help with this problem until you can see your primary caregiver. If the problems get better, see your primary caregiver if the problems return. If you were given antibiotics (medications which kill germs), take all of the mediations as directed for the full course of treatment.  If laboratory work was done, you need to find the results. Leave a telephone number where you can be reached. If this is not possible, make sure you find out how you are to get test results. HOME CARE INSTRUCTIONS   Drink lots of fluids. For adults, drink eight, 8 ounce glasses of clear juice or water a day. For children, replace fluids as suggested by your caregiver.  Empty the bladder often. Avoid holding urine for long periods of time.  After a bowel movement, women should cleanse front to back,  using each tissue only once.  Empty your bladder before and after sexual intercourse.  Take all the medicine given to you until it is gone. You may feel better in a few days, but TAKE ALL MEDICINE.  Avoid caffeine, tea, alcohol and carbonated beverages, because they tend to irritate the bladder.  In men, alcohol may irritate the prostate.  Only take over-the-counter or prescription medicines for pain, discomfort, or fever as directed by your caregiver.  If your caregiver has given you a follow-up appointment, it is very important to keep that appointment. Not keeping the appointment could result in a chronic or permanent injury, pain, and disability. If there is any problem keeping the appointment, you must call back to this facility for assistance. SEEK IMMEDIATE MEDICAL CARE IF:   Back pain develops.  A fever develops.  There is nausea (feeling sick to your stomach) or vomiting (throwing up).  Problems are no better with medications or are getting worse. MAKE SURE YOU:   Understand these instructions.  Will watch your condition.  Will get help right away if you are not doing well or get worse. Document Released: 09/20/2003 Document Revised: 03/16/2011 Document Reviewed: 07/28/2007 [facility] Patient Information 2015 [place], [place]. This information is not intended to replace advice given to you by your health care provider. Make sure you discuss any questions you have with your health care provider.  Musculoskeletal Pain Musculoskeletal pain is muscle and boney aches and pains. These pains can occur in any part of the body. Your caregiver may treat you without knowing the cause of the pain. They may treat you if blood or urine tests, X-rays, and other tests were normal.  CAUSES There is often not a definite cause or reason for these pains. These pains may be caused by a type of germ (virus). The discomfort may also come from overuse. Overuse includes working out too hard when your  body is not fit. Boney aches also come from weather changes. Bone is sensitive to atmospheric pressure changes. HOME CARE INSTRUCTIONS   Ask when your test results will be ready. Make sure you get  your test results.  Only take over-the-counter or prescription medicines for pain, discomfort, or fever as directed by your caregiver. If you were given medications for your condition, do not drive, operate machinery or power tools, or sign legal documents for 24 hours. Do not drink alcohol. Do not take sleeping pills or other medications that may interfere with treatment.  Continue all activities unless the activities cause more pain. When the pain lessens, slowly resume normal activities. Gradually increase the intensity and duration of the activities or exercise.  During periods of severe pain, bed rest may be helpful. Lay or sit in any position that is comfortable.  Putting ice on the injured area.  Put ice in a bag.  Place a towel between your skin and the bag.  Leave the ice on for 15 to 20 minutes, 3 to 4 times a day.  Follow up with your caregiver for continued problems and no reason can be found for the pain. If the pain becomes worse or does not go away, it may be necessary to repeat tests or do additional testing. Your caregiver may need to look further for a possible cause. SEEK IMMEDIATE MEDICAL CARE IF:  You have pain that is getting worse and is not relieved by medications.  You develop chest pain that is associated with shortness or breath, sweating, feeling sick to your stomach (nauseous), or throw up (vomit).  Your pain becomes localized to the abdomen.  You develop any new symptoms that seem different or that concern you. MAKE SURE YOU:   Understand these instructions.  Will watch your condition.  Will get help right away if you are not doing well or get worse. Document Released: 12/22/2004 Document Revised: 03/16/2011 Document Reviewed: 08/26/2012 [facility] Patient  Information 2015 [place], [place]. This information is not intended to replace advice given to you by your health care provider. Make sure you discuss any questions you have with your health care provider.

## 2014-05-01 NOTE — ED Provider Notes (Signed)
CSN: [id number]     Arrival date & time 05/01/14  1344 History   First MD Initiated Contact with Patient 05/01/14 1521     Chief Complaint  Patient presents with  . Urinary Tract Infection   (Consider location/radiation/quality/duration/timing/severity/associated sxs/prior Treatment) HPI Comments: 39 year old morbidly obese female complaining of pain across the lower back and generalized abdominal pain. She states abdominal pain is located primarily in the midline of the lower abdomen. She says more recently she has been having pain in the right upper quadrant. She is also having some urinary urgency and dysuria for 2 days. She has chronic kidney disease, insulin resistance and history of impaired fasting glucose. She states that she has been having normal bowel movements on a daily basis with her last one being yesterday.    Past Medical History  Diagnosis Date  . Hypertension   . Acid reflux   . High cholesterol   . History of blood transfusion "a couple"    "related to low counts"  . Headache(784.0)     "related to chemo; sometimes weekly" (09/12/2013)  . Type II diabetes mellitus     "took me off my RX ~ 04/2013"  . Iron deficiency anemia     "get epogen shots q month" (02/20/2014)  . Chronic kidney disease (CKD), stage III (moderate)   . 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 Ankle fracture surgery Right 1994  . Abdominal hysterectomy  2010    "laparoscopic"  . Fracture surgery    . Cardiac catheterization  2000's   Family History  Problem Relation Age of Onset  . Hypertension Mother   . Thyroid disease Mother   . Coronary artery disease Father   . Hypertension Father   . Diabetes Father    History  Substance Use Topics  . Smoking status: Never Smoker   . Smokeless tobacco: Never Used  . Alcohol Use: No   OB History    Gravida Para Term Preterm AB TAB  SAB Ectopic Multiple Living   3        3      Review of Systems  Constitutional: Positive for activity change. Negative for fever and fatigue.  HENT: Negative.   Respiratory: Negative.   Cardiovascular: Negative.   Gastrointestinal: Positive for abdominal pain. Negative for nausea, vomiting, diarrhea, constipation, blood in stool and abdominal distention.  Genitourinary: Positive for dysuria, urgency and pelvic pain. Negative for vaginal bleeding, vaginal discharge and vaginal pain.  Musculoskeletal: Positive for back pain.  Neurological: Negative.     Allergies  Nsaids  Home Medications   Prior to Admission medications   Medication Sig Start Date End Date Taking? Authorizing Provider  amLODipine (NORVASC) 10 MG tablet Take 10 mg by mouth daily.    Historical Provider, MD  aspirin 81 MG chewable tablet Chew 1 tablet (81 mg total) by mouth daily. 10/03/13   Eugenie Filler, MD  atorvastatin (LIPITOR) 40 MG tablet Take 40 mg by mouth daily. 03/14/14   Historical Provider, MD  cephALEXin (KEFLEX) 500 MG capsule Take 1 capsule (500 mg total) by mouth 4 (four) times daily. 05/01/14   Janne Napoleon, NP  HYDROcodone-acetaminophen (NORCO/VICODIN) 5-325 MG per tablet Take 1-2 tablets by mouth every 6 (six) hours as needed for moderate pain. 01/25/14   Fredia Sorrow, MD  metolazone (ZAROXOLYN) 2.5 MG tablet Take 1 tablet (  2.5 mg total) by mouth daily as needed (for leg swellings or shortness of breath). 04/12/14   Nishant Dhungel, MD  metoprolol tartrate (LOPRESSOR) 25 MG tablet Take 1 tablet (25 mg total) by mouth 2 (two) times daily. 10/03/13   Eugenie Filler, MD  Multiple Vitamin (MULTIVITAMIN WITH MINERALS) TABS tablet Take 1 tablet by mouth daily.    Historical Provider, MD  omeprazole (PRILOSEC) 40 MG capsule Take 40 mg by mouth daily.      Historical Provider, MD  promethazine (PHENERGAN) 25 MG tablet Take 1 tablet (25 mg total) by mouth every 6 (six) hours as needed for nausea or vomiting.  09/14/13   Theodis Blaze, MD  SUMAtriptan (IMITREX) 50 MG tablet Take 50 mg by mouth 2 (two) times daily as needed for migraine. May repeat in 2 hours if headache persists or recurs.    Historical Provider, MD  torsemide (DEMADEX) 20 MG tablet Take 2 tablets (40 mg total) by mouth daily. 04/12/14   Nishant Dhungel, MD  traZODone (DESYREL) 50 MG tablet Take 50 mg by mouth at bedtime.    Historical Provider, MD  vitamin C (ASCORBIC ACID) 500 MG tablet Take 500 mg by mouth daily.    Historical Provider, MD   BP 148/92 mmHg  Pulse 65  Temp(Src) 97.5 F (36.4 C) (Oral)  Resp 16  SpO2 100% Physical Exam  Constitutional: She is oriented to person, place, and time. She appears well-developed and well-nourished. No distress.  Neck: Normal range of motion. Neck supple.  Cardiovascular: Normal rate, regular rhythm and normal heart sounds.   Pulmonary/Chest: Effort normal. No respiratory distress. She has no wheezes. She has no rales.  Abdominal: Soft. She exhibits no mass. There is tenderness. There is guarding.  Bowel sounds hypoactive Tenderness in the right upper quadrant with positive Murphy sign Tenderness within the mid suprapubic and lesser to the left lower quadrant.  Musculoskeletal:  There is tenderness to deep palpation along the right upper paralumbar musculature. No tenderness can be elicited to the left paralumbar musculature. No spinal tenderness. No pain with leaning forward.  Neurological: She is alert and oriented to person, place, and time.  Skin: Skin is warm and dry.  Psychiatric: She has a normal mood and affect.  Nursing note and vitals reviewed.   ED Course  Procedures (including critical care time) Labs Review Labs Reviewed  POCT URINALYSIS DIP (DEVICE) - Abnormal; Notable for the following:    Glucose, UA 250 (*)    Hgb urine dipstick MODERATE (*)    Protein, ur >=300 (*)    All other components within normal limits  URINE CULTURE    Imaging Review Dg Abd 1  View  05/01/2014   CLINICAL DATA:  Chronic kidney disease.  Pain in lower stomach  EXAM: ABDOMEN - 1 VIEW  COMPARISON:  None.  FINDINGS: The bowel gas pattern appears within normal limits. No dilated loops of small bowel or air-fluid levels.  IMPRESSION: 1. Nonobstructive bowel gas pattern.   Electronically Signed   By: Kerby Moors M.D.   On: 05/01/2014 16:52     MDM   1. Generalized abdominal pain   2. Slow transit constipation   3. Dysuria   4. Bilateral low back pain without sciatica    Keflex as dir. Urine neg/unchanged from previous. Culture pending. miralax and fluids as dir. Lg colon full of stool, some gas. Normal pattern. Likely cause of her pain. Consider cholestatic dz with RUQ pain and tenderness. See  PCP. Instructions on MS back pain And dysuria   \  Janne Napoleon, NP 05/01/14 1702

## 2014-05-01 NOTE — ED Notes (Signed)
C/o  Lower back and lower pelvic discomfort.  Urinary urgency.  Nausea.   Denies discharge, fever, v/d,Hx of chronic kidney disease.    Symptoms present x 2 days.

## 2014-05-03 LAB — URINE CULTURE
CULTURE: NO GROWTH
Colony Count: NO GROWTH
Special Requests: NORMAL

## 2014-05-06 ENCOUNTER — Emergency Department (HOSPITAL_COMMUNITY): Payer: Medicaid Other

## 2014-05-06 ENCOUNTER — Encounter (HOSPITAL_COMMUNITY): Payer: Self-pay

## 2014-05-06 ENCOUNTER — Emergency Department (HOSPITAL_COMMUNITY)
Admission: EM | Admit: 2014-05-06 | Discharge: 2014-05-06 | Disposition: A | Discharge status: Home / Self Care | Payer: Medicaid Other | Attending: Emergency Medicine | Admitting: Emergency Medicine

## 2014-05-06 DIAGNOSIS — R079 Chest pain, unspecified: Secondary | ICD-10-CM | POA: Diagnosis present

## 2014-05-06 DIAGNOSIS — E780 Pure hypercholesterolemia: Secondary | ICD-10-CM | POA: Insufficient documentation

## 2014-05-06 DIAGNOSIS — Z9889 Other specified postprocedural states: Secondary | ICD-10-CM | POA: Diagnosis not present

## 2014-05-06 DIAGNOSIS — Z79899 Other long term (current) drug therapy: Secondary | ICD-10-CM | POA: Insufficient documentation

## 2014-05-06 DIAGNOSIS — N189 Chronic kidney disease, unspecified: Secondary | ICD-10-CM | POA: Insufficient documentation

## 2014-05-06 DIAGNOSIS — K219 Gastro-esophageal reflux disease without esophagitis: Secondary | ICD-10-CM | POA: Diagnosis not present

## 2014-05-06 DIAGNOSIS — Z7982 Long term (current) use of aspirin: Secondary | ICD-10-CM | POA: Insufficient documentation

## 2014-05-06 DIAGNOSIS — I129 Hypertensive chronic kidney disease with stage 1 through stage 4 chronic kidney disease, or unspecified chronic kidney disease: Secondary | ICD-10-CM | POA: Insufficient documentation

## 2014-05-06 DIAGNOSIS — Z862 Personal history of diseases of the blood and blood-forming organs and certain disorders involving the immune mechanism: Secondary | ICD-10-CM | POA: Diagnosis not present

## 2014-05-06 DIAGNOSIS — R1010 Upper abdominal pain, unspecified: Secondary | ICD-10-CM

## 2014-05-06 DIAGNOSIS — R1011 Right upper quadrant pain: Secondary | ICD-10-CM | POA: Insufficient documentation

## 2014-05-06 DIAGNOSIS — Z90710 Acquired absence of both cervix and uterus: Secondary | ICD-10-CM | POA: Insufficient documentation

## 2014-05-06 DIAGNOSIS — Z792 Long term (current) use of antibiotics: Secondary | ICD-10-CM | POA: Insufficient documentation

## 2014-05-06 DIAGNOSIS — E119 Type 2 diabetes mellitus without complications: Secondary | ICD-10-CM | POA: Insufficient documentation

## 2014-05-06 LAB — CBC
HCT: 29.4 % — ABNORMAL LOW (ref 36.0–46.0)
Hemoglobin: 9.5 g/dL — ABNORMAL LOW (ref 12.0–15.0)
MCH: 24.2 pg — ABNORMAL LOW (ref 26.0–34.0)
MCHC: 32.3 g/dL (ref 30.0–36.0)
MCV: 75 fL — ABNORMAL LOW (ref 78.0–100.0)
Platelets: 335 10*3/uL (ref 150–400)
RBC: 3.92 MIL/uL (ref 3.87–5.11)
RDW: 15.1 % (ref 11.5–15.5)
WBC: 5.4 10*3/uL (ref 4.0–10.5)

## 2014-05-06 LAB — BASIC METABOLIC PANEL
ANION GAP: 11 (ref 5–15)
BUN: 38 mg/dL — ABNORMAL HIGH (ref 6–20)
CO2: 23 mmol/L (ref 22–32)
Calcium: 8.3 mg/dL — ABNORMAL LOW (ref 8.9–10.3)
Chloride: 103 mmol/L (ref 101–111)
Creatinine, Ser: 2.86 mg/dL — ABNORMAL HIGH (ref 0.44–1.00)
GFR, EST AFRICAN AMERICAN: 23 mL/min — AB (ref >60)
GFR, EST NON AFRICAN AMERICAN: 20 mL/min — AB (ref >60)
Glucose, Bld: 104 mg/dL — ABNORMAL HIGH (ref 70–99)
Potassium: 3.2 mmol/L — ABNORMAL LOW (ref 3.5–5.1)
SODIUM: 137 mmol/L (ref 135–145)

## 2014-05-06 LAB — HEPATIC FUNCTION PANEL
ALK PHOS: 33 U/L — AB (ref 38–126)
ALT: 14 U/L (ref 14–54)
AST: 24 U/L (ref 15–41)
Albumin: 1.9 g/dL — ABNORMAL LOW (ref 3.5–5.0)
Bilirubin, Direct: <0.1 mg/dL — ABNORMAL LOW (ref 0.1–0.5)
Total Bilirubin: 0.6 mg/dL (ref 0.3–1.2)
Total Protein: 5.1 g/dL — ABNORMAL LOW (ref 6.5–8.1)

## 2014-05-06 LAB — I-STAT TROPONIN, ED: TROPONIN I, POC: 0 ng/mL (ref 0.00–0.08)

## 2014-05-06 LAB — LIPASE, BLOOD: LIPASE: 22 U/L (ref 22–51)

## 2014-05-06 MED ORDER — ONDANSETRON HCL 4 MG/2ML IJ SOLN
4.0000 mg | Freq: Once | INTRAMUSCULAR | Status: AC
  Administered 2014-05-06: 4 mg via INTRAVENOUS
  Filled 2014-05-06: qty 2

## 2014-05-06 MED ORDER — SODIUM CHLORIDE 0.9 % IV BOLUS (SEPSIS)
1000.0000 mL | Freq: Once | INTRAVENOUS | Status: AC
  Administered 2014-05-06: 1000 mL via INTRAVENOUS

## 2014-05-06 MED ORDER — HYDROCODONE-ACETAMINOPHEN 5-325 MG PO TABS: 1.0000 | ORAL_TABLET | Freq: Four times a day (QID) | ORAL | Status: DC | PRN

## 2014-05-06 MED ORDER — ONDANSETRON 8 MG PO TBDP: 8.0000 mg | ORAL_TABLET | Freq: Three times a day (TID) | ORAL | Status: DC | PRN

## 2014-05-06 MED ORDER — MORPHINE SULFATE 4 MG/ML IJ SOLN
6.0000 mg | Freq: Once | INTRAMUSCULAR | Status: AC
  Administered 2014-05-06: 6 mg via INTRAVENOUS
  Filled 2014-05-06: qty 2

## 2014-05-06 NOTE — ED Provider Notes (Signed)
CSN: [id number]     Arrival date & time 05/06/14  1613 History   First MD Initiated Contact with Patient 05/06/14 1650     Chief Complaint  Patient presents with  . Chest Pain      HPI Patient presents emergency department complaining of mid chest discomfort which has now been constant.  This began at 11 AM today.  She also reports over the last week she's had associated nausea vomiting and right lateral and right upper abdominal pain.  She still having some discomfort at this time.  She denies shortness breath.  No productive cough.  No fevers or chills.  No hematemesis.  No melena or hematochezia.  Her symptoms are moderate in severity.   Past Medical History  Diagnosis Date  . Hypertension   . Acid reflux   . High cholesterol   . History of blood transfusion "a couple"    "related to low counts"  . Headache(784.0)     "related to chemo; sometimes weekly" (09/12/2013)  . Type II diabetes mellitus     "took me off my RX ~ 04/2013"  . Iron deficiency anemia     "get epogen shots q month" (02/20/2014)  . Chronic kidney disease (CKD), stage III (moderate)   . 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 Ankle fracture surgery Right 1994  . Abdominal hysterectomy  2010    "laparoscopic"  . Fracture surgery    . Cardiac catheterization  2000's   Family History  Problem Relation Age of Onset  . Hypertension Mother   . Thyroid disease Mother   . Coronary artery disease Father   . Hypertension Father   . Diabetes Father    History  Substance Use Topics  . Smoking status: Never Smoker   . Smokeless tobacco: Never Used  . Alcohol Use: No   OB History    Gravida Para Term Preterm AB TAB SAB Ectopic Multiple Living   3        3      Review of Systems  All other systems reviewed and are negative.     Allergies  Nsaids  Home Medications   Prior to Admission  medications   Medication Sig Start Date End Date Taking? Authorizing Provider  amLODipine (NORVASC) 10 MG tablet Take 10 mg by mouth daily.   Yes Historical Provider, MD  amoxicillin (AMOXIL) 500 MG capsule Take 500 mg by mouth 2 (two) times daily. 10 day course started 04/26/2014 04/30/14 05/10/14 Yes Historical Provider, MD  aspirin 81 MG chewable tablet Chew 1 tablet (81 mg total) by mouth daily. 10/03/13  Yes Eugenie Filler, MD  atorvastatin (LIPITOR) 40 MG tablet Take 40 mg by mouth daily. 03/14/14  Yes Historical Provider, MD  ciprofloxacin (CIPRO) 500 MG tablet Take 500 mg by mouth 2 (two) times daily. 7 day course started 05/01/14 05/01/14  Yes Historical Provider, MD  lisinopril (PRINIVIL,ZESTRIL) 10 MG tablet Take 10 mg by mouth daily.   Yes Historical Provider, MD  metolazone (ZAROXOLYN) 2.5 MG tablet Take 1 tablet (2.5 mg total) by mouth daily as needed (for leg swellings or shortness of breath). 04/12/14  Yes Nishant Dhungel, MD  metoprolol tartrate (LOPRESSOR) 25 MG tablet Take 1 tablet (25 mg total) by mouth 2 (two) times daily. 10/03/13  Yes Eugenie Filler, MD  Multiple Vitamin (MULTIVITAMIN WITH MINERALS)  TABS tablet Take 1 tablet by mouth daily.   Yes Historical Provider, MD  omeprazole (PRILOSEC) 40 MG capsule Take 40 mg by mouth daily.     Yes Historical Provider, MD  promethazine (PHENERGAN) 25 MG tablet Take 1 tablet (25 mg total) by mouth every 6 (six) hours as needed for nausea or vomiting. 09/14/13  Yes Theodis Blaze, MD  SUMAtriptan (IMITREX) 50 MG tablet Take 50 mg by mouth 2 (two) times daily as needed for migraine. May repeat in 2 hours if headache persists or recurs.   Yes Historical Provider, MD  torsemide (DEMADEX) 20 MG tablet Take 2 tablets (40 mg total) by mouth daily. 04/12/14  Yes Nishant Dhungel, MD  traZODone (DESYREL) 50 MG tablet Take 50 mg by mouth at bedtime.   Yes Historical Provider, MD  vitamin C (ASCORBIC ACID) 500 MG tablet Take 500 mg by mouth daily.   Yes  Historical Provider, MD  cephALEXin (KEFLEX) 500 MG capsule Take 1 capsule (500 mg total) by mouth 4 (four) times daily. Patient not taking: Reported on 05/06/2014 05/01/14   Janne Napoleon, NP  HYDROcodone-acetaminophen (NORCO/VICODIN) 5-325 MG per tablet Take 1 tablet by mouth every 6 (six) hours as needed for moderate pain. 05/06/14   Jola Schmidt, MD  ondansetron (ZOFRAN ODT) 8 MG disintegrating tablet Take 1 tablet (8 mg total) by mouth every 8 (eight) hours as needed for nausea or vomiting. 05/06/14   Jola Schmidt, MD   BP 132/69 mmHg  Pulse 60  Temp(Src) 98.5 F (36.9 C) (Oral)  Resp 13  Ht $R'5\' 7"'tH$  (1.702 m)  Wt 236 lb 11.2 oz (107.366 kg)  BMI 37.06 kg/m2  SpO2 100% Physical Exam  Constitutional: She is oriented to person, place, and time. She appears well-developed and well-nourished. No distress.  HENT:  Head: Normocephalic and atraumatic.  Eyes: EOM are normal.  Neck: Normal range of motion.  Cardiovascular: Normal rate, regular rhythm and normal heart sounds.   Pulmonary/Chest: Effort normal and breath sounds normal.  Mild right lateral chest tenderness.  No rash.  Abdominal: Soft. She exhibits no distension.  Mild right upper quadrant tenderness.  No guarding or rebound.  Musculoskeletal: Normal range of motion.  Neurological: She is alert and oriented to person, place, and time.  Skin: Skin is warm and dry.  Psychiatric: She has a normal mood and affect. Judgment normal.  Nursing note and vitals reviewed.   ED Course  Procedures (including critical care time) Labs Review Labs Reviewed  CBC - Abnormal; Notable for the following:    Hemoglobin 9.5 (*)    HCT 29.4 (*)    MCV 75.0 (*)    MCH 24.2 (*)    All other components within normal limits  BASIC METABOLIC PANEL - Abnormal; Notable for the following:    Potassium 3.2 (*)    Glucose, Bld 104 (*)    BUN 38 (*)    Creatinine, Ser 2.86 (*)    Calcium 8.3 (*)    GFR calc non Af Amer 20 (*)    GFR calc Af Amer 23 (*)     All other components within normal limits  HEPATIC FUNCTION PANEL - Abnormal; Notable for the following:    Total Protein 5.1 (*)    Albumin 1.9 (*)    Alkaline Phosphatase 33 (*)    Bilirubin, Direct <0.1 (*)    All other components within normal limits  LIPASE, BLOOD  I-STAT TROPOININ, ED   BUN  Date Value Ref  Range Status  05/06/2014 38* 6 - 20 mg/dL Final  04/12/2014 35* 6 - 23 mg/dL Final  04/11/2014 30* 6 - 23 mg/dL Final  04/10/2014 30* 6 - 23 mg/dL Final   CREATININE, SER  Date Value Ref Range Status  05/06/2014 2.86* 0.44 - 1.00 mg/dL Final  04/12/2014 3.96* 0.50 - 1.10 mg/dL Final  04/11/2014 3.99* 0.50 - 1.10 mg/dL Final  04/10/2014 3.78* 0.50 - 1.10 mg/dL Final       Imaging Review Dg Chest 2 View  05/06/2014   CLINICAL DATA:  Left chest pain. Right abdominal pain with nausea and vomiting for 1 week.  EXAM: CHEST  2 VIEW  COMPARISON:  04/10/2014  FINDINGS: Low lung volumes are present, causing crowding of the pulmonary vasculature. Borderline enlargement of the cardiopericardial silhouette.  Airway thickening is present, suggesting bronchitis or reactive airways disease. No airspace opacity or pleural effusion identified.  IMPRESSION: 1. Airway thickening is present, suggesting bronchitis or reactive airways disease. 2. Borderline cardiomegaly.   Electronically Signed   By: Van Clines M.D.   On: 05/06/2014 20:11   US Abdomen Complete  05/06/2014   CLINICAL DATA:  Right upper quadrant pain for 6 days  EXAM: ULTRASOUND ABDOMEN COMPLETE  COMPARISON:  None.  FINDINGS: Gallbladder: Gallbladder is normal in appearance with no wall thickening or stones. There is tenderness upon palpation over the gallbladder consistent with positive Murphy's sign.  Common bile duct: Diameter: 4 mm  Liver: Mildly heterogeneous echotexture suggests possibility of fatty infiltration  IVC: No abnormality visualized.  Pancreas: Visualized portion unremarkable.  Spleen: Size and appearance  within normal limits.  Right Kidney: Length: 13 cm. Mildly echogenic cortex. Otherwise negative.  Left Kidney: Length: 13 cm. Similar appearance to contralateral kidney  Abdominal aorta: No aneurysm visualized.  Other findings: None.  IMPRESSION: 1. Evidence of medical renal disease 2. Positive sonographic Murphy sign with no other abnormalities involving the gallbladder. Significance uncertain.   Electronically Signed   By: Skipper Cliche M.D.   On: 05/06/2014 18:39     EKG Interpretation None      MDM   Final diagnoses:  Chest pain, unspecified chest pain type  Upper abdominal pain    Patient was tender in the right upper quadrant.  Initially ultrasound was obtained however this demonstrated no evidence of cholecystitis based on imaging.  No gallstones, thickening of the gallbladder wall, pericholecystic fluid.  Chronic renal sufficiency.  Labs otherwise without concerning abnormality.  Chest x-ray normal.  In regards to her chest pain about this ACS.  EKG is without ischemic changes.  Troponin is negative.  Her pain is been constant.  She's feeling better this time.  Primary care follow-up.    Jola Schmidt, MD 05/07/14 [phone]

## 2014-05-06 NOTE — ED Notes (Signed)
Onset 11am mid chest pain, pressure, constant.  Pt seen last week for right side abd pain, pt still having pain.  No shortness of breath.

## 2014-06-05 ENCOUNTER — Other Ambulatory Visit: Payer: Self-pay | Admitting: Internal Medicine

## 2014-06-09 ENCOUNTER — Encounter (HOSPITAL_COMMUNITY): Payer: Self-pay | Admitting: Emergency Medicine

## 2014-06-09 DIAGNOSIS — I129 Hypertensive chronic kidney disease with stage 1 through stage 4 chronic kidney disease, or unspecified chronic kidney disease: Secondary | ICD-10-CM | POA: Diagnosis not present

## 2014-06-09 DIAGNOSIS — R0789 Other chest pain: Secondary | ICD-10-CM | POA: Insufficient documentation

## 2014-06-09 DIAGNOSIS — K219 Gastro-esophageal reflux disease without esophagitis: Secondary | ICD-10-CM | POA: Diagnosis not present

## 2014-06-09 DIAGNOSIS — E780 Pure hypercholesterolemia: Secondary | ICD-10-CM | POA: Insufficient documentation

## 2014-06-09 DIAGNOSIS — Z79899 Other long term (current) drug therapy: Secondary | ICD-10-CM | POA: Insufficient documentation

## 2014-06-09 DIAGNOSIS — N183 Chronic kidney disease, stage 3 (moderate): Secondary | ICD-10-CM | POA: Insufficient documentation

## 2014-06-09 DIAGNOSIS — Z7982 Long term (current) use of aspirin: Secondary | ICD-10-CM | POA: Diagnosis not present

## 2014-06-09 DIAGNOSIS — R079 Chest pain, unspecified: Secondary | ICD-10-CM | POA: Diagnosis present

## 2014-06-09 DIAGNOSIS — M25562 Pain in left knee: Secondary | ICD-10-CM | POA: Diagnosis not present

## 2014-06-09 DIAGNOSIS — E119 Type 2 diabetes mellitus without complications: Secondary | ICD-10-CM | POA: Insufficient documentation

## 2014-06-09 LAB — CBC
HEMATOCRIT: 26.1 % — AB (ref 36.0–46.0)
Hemoglobin: 8.5 g/dL — ABNORMAL LOW (ref 12.0–15.0)
MCH: 24.6 pg — ABNORMAL LOW (ref 26.0–34.0)
MCHC: 32.6 g/dL (ref 30.0–36.0)
MCV: 75.4 fL — AB (ref 78.0–100.0)
Platelets: 366 10*3/uL (ref 150–400)
RBC: 3.46 MIL/uL — AB (ref 3.87–5.11)
RDW: 14.6 % (ref 11.5–15.5)
WBC: 6.1 10*3/uL (ref 4.0–10.5)

## 2014-06-09 LAB — BASIC METABOLIC PANEL
ANION GAP: 9 (ref 5–15)
BUN: 43 mg/dL — ABNORMAL HIGH (ref 6–20)
CALCIUM: 7.9 mg/dL — AB (ref 8.9–10.3)
CHLORIDE: 105 mmol/L (ref 101–111)
CO2: 23 mmol/L (ref 22–32)
CREATININE: 2.46 mg/dL — AB (ref 0.44–1.00)
GFR calc Af Amer: 27 mL/min — ABNORMAL LOW (ref >60)
GFR calc non Af Amer: 23 mL/min — ABNORMAL LOW (ref >60)
GLUCOSE: 89 mg/dL (ref 65–99)
POTASSIUM: 3.7 mmol/L (ref 3.5–5.1)
SODIUM: 137 mmol/L (ref 135–145)

## 2014-06-09 LAB — I-STAT TROPONIN, ED: Troponin i, poc: 0 ng/mL (ref 0.00–0.08)

## 2014-06-09 NOTE — ED Notes (Signed)
Patient here with complaint of chest pain starting tonight about 2 hours ago. States hx of angina with rx for ntg at home. States that pain was sudden tonight, felt "real heavy", and she broke out in sweats.

## 2014-06-10 ENCOUNTER — Emergency Department (HOSPITAL_COMMUNITY)
Admission: EM | Admit: 2014-06-10 | Discharge: 2014-06-10 | Disposition: A | Discharge status: Home / Self Care | Payer: Medicaid Other | Attending: Emergency Medicine | Admitting: Emergency Medicine

## 2014-06-10 DIAGNOSIS — N183 Chronic kidney disease, stage 3 unspecified: Secondary | ICD-10-CM

## 2014-06-10 DIAGNOSIS — R252 Cramp and spasm: Secondary | ICD-10-CM

## 2014-06-10 DIAGNOSIS — R0789 Other chest pain: Secondary | ICD-10-CM

## 2014-06-10 DIAGNOSIS — M25562 Pain in left knee: Secondary | ICD-10-CM

## 2014-06-10 LAB — I-STAT TROPONIN, ED: Troponin i, poc: 0 ng/mL (ref 0.00–0.08)

## 2014-06-10 NOTE — ED Notes (Signed)
Pt also requesting RN to look at L knee, states she's been having swelling since her arrival, painful.

## 2014-06-10 NOTE — Discharge Instructions (Signed)
Arthralgia Your caregiver has diagnosed you as suffering from an arthralgia. Arthralgia means there is pain in a joint. This can come from many reasons including:  Bruising the joint which causes soreness (inflammation) in the joint.  Wear and tear on the joints which occur as we grow older (osteoarthritis).  Overusing the joint.  Various forms of arthritis.  Infections of the joint. Regardless of the cause of pain in your joint, most of these different pains respond to anti-inflammatory drugs and rest. The exception to this is when a joint is infected, and these cases are treated with antibiotics, if it is a bacterial infection. HOME CARE INSTRUCTIONS   Rest the injured area for as long as directed by your caregiver. Then slowly start using the joint as directed by your caregiver and as the pain allows. Crutches as directed may be useful if the ankles, knees or hips are involved. If the knee was splinted or casted, continue use and care as directed. If an stretchy or elastic wrapping bandage has been applied today, it should be removed and re-applied every 3 to 4 hours. It should not be applied tightly, but firmly enough to keep swelling down. Watch toes and feet for swelling, bluish discoloration, coldness, numbness or excessive pain. If any of these problems (symptoms) occur, remove the ace bandage and re-apply more loosely. If these symptoms persist, contact your caregiver or return to this location.  For the first 24 hours, keep the injured extremity elevated on pillows while lying down.  Apply ice for 15-20 minutes to the sore joint every couple hours while awake for the first half day. Then 03-04 times per day for the first 48 hours. Put the ice in a plastic bag and place a towel between the bag of ice and your skin.  Wear any splinting, casting, elastic bandage applications, or slings as instructed.  Only take over-the-counter or prescription medicines for pain, discomfort, or fever as  directed by your caregiver. Do not use aspirin immediately after the injury unless instructed by your physician. Aspirin can cause increased bleeding and bruising of the tissues.  If you were given crutches, continue to use them as instructed and do not resume weight bearing on the sore joint until instructed. Persistent pain and inability to use the sore joint as directed for more than 2 to 3 days are warning signs indicating that you should see a caregiver for a follow-up visit as soon as possible. Initially, a hairline fracture (break in bone) may not be evident on X-rays. Persistent pain and swelling indicate that further evaluation, non-weight bearing or use of the joint (use of crutches or slings as instructed), or further X-rays are indicated. X-rays may sometimes not show a small fracture until a week or 10 days later. Make a follow-up appointment with your own caregiver or one to whom we have referred you. A radiologist (specialist in reading X-rays) may read your X-rays. Make sure you know how you are to obtain your X-ray results. Do not assume everything is normal if you do not hear from [place]. SEEK MEDICAL CARE IF: Bruising, swelling, or pain increases. SEEK IMMEDIATE MEDICAL CARE IF:   Your fingers or toes are numb or blue.  The pain is not responding to medications and continues to stay the same or get worse.  The pain in your joint becomes severe.  You develop a fever over 102 F (38.9 C).  It becomes impossible to move or use the joint. MAKE SURE YOU:  Understand these instructions.  Will watch your condition.  Will get help right away if you are not doing well or get worse. Document Released: 12/22/2004 Document Revised: 03/16/2011 Document Reviewed: 08/10/2007 [facility] Patient Information 2015 [place], [place]. This information is not intended to replace advice given to you by your health care provider. Make sure you discuss any questions you have with your health care  provider.  Chest Pain (Nonspecific) It is often hard to give a specific diagnosis for the cause of chest pain. There is always a chance that your pain could be related to something serious, such as a heart attack or a blood clot in the lungs. You need to follow up with your health care provider for further evaluation. CAUSES   Heartburn.  Pneumonia or bronchitis.  Anxiety or stress.  Inflammation around your heart (pericarditis) or lung (pleuritis or pleurisy).  A blood clot in the lung.  A collapsed lung (pneumothorax). It can develop suddenly on its own (spontaneous pneumothorax) or from trauma to the chest.  Shingles infection (herpes zoster virus). The chest wall is composed of bones, muscles, and cartilage. Any of these can be the source of the pain.  The bones can be bruised by injury.  The muscles or cartilage can be strained by coughing or overwork.  The cartilage can be affected by inflammation and become sore (costochondritis). DIAGNOSIS  Lab tests or other studies may be needed to find the cause of your pain. Your health care provider may have you take a test called an ambulatory electrocardiogram (ECG). An ECG records your heartbeat patterns over a 24-hour period. You may also have other tests, such as:  Transthoracic echocardiogram (TTE). During echocardiography, sound waves are used to evaluate how blood flows through your heart.  Transesophageal echocardiogram (TEE).  Cardiac monitoring. This allows your health care provider to monitor your heart rate and rhythm in real time.  Holter monitor. This is a portable device that records your heartbeat and can help diagnose heart arrhythmias. It allows your health care provider to track your heart activity for several days, if needed.  Stress tests by exercise or by giving medicine that makes the heart beat faster. TREATMENT   Treatment depends on what may be causing your chest pain. Treatment may include:  Acid  blockers for heartburn.  Anti-inflammatory medicine.  Pain medicine for inflammatory conditions.  Antibiotics if an infection is present.  You may be advised to change lifestyle habits. This includes stopping smoking and avoiding alcohol, caffeine, and chocolate.  You may be advised to keep your head raised (elevated) when sleeping. This reduces the chance of acid going backward from your stomach into your esophagus. Most of the time, nonspecific chest pain will improve within 2-3 days with rest and mild pain medicine.  HOME CARE INSTRUCTIONS   If antibiotics were prescribed, take them as directed. Finish them even if you start to feel better.  For the next few days, avoid physical activities that bring on chest pain. Continue physical activities as directed.  Do not use any tobacco products, including cigarettes, chewing tobacco, or electronic cigarettes.  Avoid drinking alcohol.  Only take medicine as directed by your health care provider.  Follow your health care provider's suggestions for further testing if your chest pain does not go away.  Keep any follow-up appointments you made. If you do not go to an appointment, you could develop lasting (chronic) problems with pain. If there is any problem keeping an appointment, call to reschedule. SEEK  MEDICAL CARE IF:   Your chest pain does not go away, even after treatment.  You have a rash with blisters on your chest.  You have a fever. SEEK IMMEDIATE MEDICAL CARE IF:   You have increased chest pain or pain that spreads to your arm, neck, jaw, back, or abdomen.  You have shortness of breath.  You have an increasing cough, or you cough up blood.  You have severe back or abdominal pain.  You feel nauseous or vomit.  You have severe weakness.  You faint.  You have chills. This is an emergency. Do not wait to see if the pain will go away. Get medical help at once. Call your local emergency services (911 in [place] Do not  drive yourself to the hospital. MAKE SURE YOU:   Understand these instructions.  Will watch your condition.  Will get help right away if you are not doing well or get worse. Document Released: 10/01/2004 Document Revised: 12/27/2012 Document Reviewed: 07/28/2007 [facility] Patient Information 2015 [place], [place]. This information is not intended to replace advice given to you by your health care provider. Make sure you discuss any questions you have with your health care provider.  Chronic Kidney Disease Chronic kidney disease occurs when the kidneys are damaged over a long period. The kidneys are two organs that lie on either side of the spine between the middle of the back and the front of the abdomen. The kidneys:   Remove wastes and extra water from the blood.   Produce important hormones. These help keep bones strong, regulate blood pressure, and help create red blood cells.   Balance the fluids and chemicals in the blood and tissues. A small amount of kidney damage may not cause problems, but a large amount of damage may make it difficult or impossible for the kidneys to work the way they should. If steps are not taken to slow down the kidney damage or stop it from getting worse, the kidneys may stop working permanently. Most of the time, chronic kidney disease does not go away. However, it can often be controlled, and those with the disease can usually live normal lives. CAUSES  The most common causes of chronic kidney disease are diabetes and high blood pressure (hypertension). Chronic kidney disease may also be caused by:   Diseases that cause the kidneys' filters to become inflamed.   Diseases that affect the immune system.   Genetic diseases.   Medicines that damage the kidneys, such as anti-inflammatory medicines.  Poisoning or exposure to toxic substances.   A reoccurring kidney or urinary infection.   A problem with urine flow. This may be caused by:    Cancer.   Kidney stones.   An enlarged prostate in males. SIGNS AND SYMPTOMS  Because the kidney damage in chronic kidney disease occurs slowly, symptoms develop slowly and may not be obvious until the kidney damage becomes severe. A person may have a kidney disease for years without showing any symptoms. Symptoms can include:   Swelling (edema) of the legs, ankles, or feet.   Tiredness (lethargy).   Nausea or vomiting.   Confusion.   Problems with urination, such as:   Decreased urine production.   Frequent urination, especially at night.   Frequent accidents in children who are potty trained.   Muscle twitches and cramps.   Shortness of breath.  Weakness.   Persistent itchiness.   Loss of appetite.  Metallic taste in the mouth.  Trouble sleeping.  Slowed  development in children.  Short stature in children. DIAGNOSIS  Chronic kidney disease may be detected and diagnosed by tests, including blood, urine, imaging, or kidney biopsy tests.  TREATMENT  Most chronic kidney diseases cannot be cured. Treatment usually involves relieving symptoms and preventing or slowing the progression of the disease. Treatment may include:   A special diet. You may need to avoid alcohol and foods thatare salty and high in potassium.   Medicines. These may:   Lower blood pressure.   Relieve anemia.   Relieve swelling.   Protect the bones. HOME CARE INSTRUCTIONS   Follow your prescribed diet.   Take medicines only as directed by your health care provider. Do not take any new medicines (prescription, over-the-counter, or nutritional supplements) unless approved by your health care provider. Many medicines can worsen your kidney damage or need to have the dose adjusted.   Quit smoking if you smoke. Talk to your health care provider about a smoking cessation program.   Keep all follow-up visits as directed by your health care provider. SEEK IMMEDIATE  MEDICAL CARE IF:  Your symptoms get worse or you develop new symptoms.   You develop symptoms of end-stage kidney disease. These include:   Headaches.   Abnormally dark or light skin.   Numbness in the hands or feet.   Easy bruising.   Frequent hiccups.   Menstruation stops.   You have a fever.   You have decreased urine production.   You havepain or bleeding when urinating. MAKE SURE YOU:  Understand these instructions.  Will watch your condition.  Will get help right away if you are not doing well or get worse. FOR MORE INFORMATION   American Association of Kidney Patients: BombTimer.gl  National Kidney Foundation: www.kidney.[place]: https://mathis.com/  [facility]: www.lifeoptions.org and www.kidneyschool.org Document Released: 10/01/2007 Document Revised: 05/08/2013 Document Reviewed: 08/21/2011 [facility] Patient Information 2015 [place], [place]. This information is not intended to replace advice given to you by your health care provider. Make sure you discuss any questions you have with your health care provider.  Muscle Cramps and Spasms Muscle cramps and spasms occur when a muscle or muscles tighten and you have no control over this tightening (involuntary muscle contraction). They are a common problem and can develop in any muscle. The most common place is in the calf muscles of the leg. Both muscle cramps and muscle spasms are involuntary muscle contractions, but they also have differences:   Muscle cramps are sporadic and painful. They may last a few seconds to a quarter of an hour. Muscle cramps are often more forceful and last longer than muscle spasms.  Muscle spasms may or may not be painful. They may also last just a few seconds or much longer. CAUSES  It is uncommon for cramps or spasms to be due to a serious underlying problem. In many cases, the cause of cramps or spasms is unknown. Some common causes  are:   Overexertion.   Overuse from repetitive motions (doing the same thing over and over).   Remaining in a certain position for a long period of time.   Improper preparation, form, or technique while performing a sport or activity.   Dehydration.   Injury.   Side effects of some medicines.   Abnormally low levels of the salts and ions in your blood (electrolytes), especially potassium and calcium. This could happen if you are taking water pills (diuretics) or you are pregnant.  Some underlying medical  problems can make it more likely to develop cramps or spasms. These include, but are not limited to:   Diabetes.   Parkinson disease.   Hormone disorders, such as thyroid problems.   Alcohol abuse.   Diseases specific to muscles, joints, and bones.   Blood vessel disease where not enough blood is getting to the muscles.  HOME CARE INSTRUCTIONS   Stay well hydrated. Drink enough water and fluids to keep your urine clear or pale yellow.  It may be helpful to massage, stretch, and relax the affected muscle.  For tight or tense muscles, use a warm towel, heating pad, or hot shower water directed to the affected area.  If you are sore or have pain after a cramp or spasm, applying ice to the affected area may relieve discomfort.  Put ice in a plastic bag.  Place a towel between your skin and the bag.  Leave the ice on for 15-20 minutes, 03-04 times a day.  Medicines used to treat a known cause of cramps or spasms may help reduce their frequency or severity. Only take over-the-counter or prescription medicines as directed by your caregiver. SEEK MEDICAL CARE IF:  Your cramps or spasms get more severe, more frequent, or do not improve over time.  MAKE SURE YOU:   Understand these instructions.  Will watch your condition.  Will get help right away if you are not doing well or get worse. Document Released: 06/13/2001 Document Revised: 04/18/2012 Document  Reviewed: 12/09/2011 [facility] Patient Information 2015 [place], [place]. This information is not intended to replace advice given to you by your health care provider. Make sure you discuss any questions you have with your health care provider.

## 2014-06-10 NOTE — ED Provider Notes (Signed)
CSN: [id number]     Arrival date & time 06/09/14  2046 History   This chart was scribed for Linton Flemings, MD by Randa Evens, ED Scribe. This patient was seen in room A04C/A04C and the patient's care was started at 1:34 AM.     Chief Complaint  Patient presents with  . Chest Pain   The history is provided by the patient. No language interpreter was used.   HPI Comments: Patricia [name] is a 39 y.o. female who presents to the Emergency Department complaining of sudden improving chest pain onset 2 hours PTA. Pt states she had associated diaphoresis and myalgias that have resolved. She states she had cramping all over. Pt states that after the cramping resolved she felt weak all over. Pt doesn't report any medications PTA. Pt is also complaining of left knee pain with associated swelling for the past week. Pt denies any injury. Pt denies fever.    Past Medical History  Diagnosis Date  . Hypertension   . Acid reflux   . High cholesterol   . History of blood transfusion "a couple"    "related to low counts"  . Headache(784.0)     "related to chemo; sometimes weekly" (09/12/2013)  . Type II diabetes mellitus     "took me off my RX ~ 04/2013"  . Iron deficiency anemia     "get epogen shots q month" (02/20/2014)  . Chronic kidney disease (CKD), stage III (moderate)   . 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 Ankle fracture surgery Right 1994  . Abdominal hysterectomy  2010    "laparoscopic"  . Fracture surgery    . Cardiac catheterization  2000's   Family History  Problem Relation Age of Onset  . Hypertension Mother   . Thyroid disease Mother   . Coronary artery disease Father   . Hypertension Father   . Diabetes Father    History  Substance Use Topics  . Smoking status: Never Smoker   . Smokeless tobacco: Never Used  . Alcohol Use: No   OB History    Gravida Para  Term Preterm AB TAB SAB Ectopic Multiple Living   3        3       Review of Systems  Cardiovascular: Positive for chest pain.  A complete 10 system review of systems was obtained and all systems are negative except as noted in the HPI and PMH.    Allergies  Nsaids  Home Medications   Prior to Admission medications   Medication Sig Start Date End Date Taking? Authorizing Provider  amLODipine (NORVASC) 10 MG tablet Take 10 mg by mouth daily.    Historical Provider, MD  aspirin 81 MG chewable tablet Chew 1 tablet (81 mg total) by mouth daily. 10/03/13   Eugenie Filler, MD  atorvastatin (LIPITOR) 40 MG tablet Take 40 mg by mouth daily. 03/14/14   Historical Provider, MD  cephALEXin (KEFLEX) 500 MG capsule Take 1 capsule (500 mg total) by mouth 4 (four) times daily. Patient not taking: Reported on 05/06/2014 05/01/14   Janne Napoleon, NP  ciprofloxacin (CIPRO) 500 MG tablet Take 500 mg by mouth 2 (two) times daily. 7 day course started 05/01/14 05/01/14   Historical Provider, MD  HYDROcodone-acetaminophen (NORCO/VICODIN) 5-325 MG per tablet Take 1 tablet by mouth every 6 (six) hours as needed for moderate  pain. 05/06/14   Jola Schmidt, MD  lisinopril (PRINIVIL,ZESTRIL) 10 MG tablet Take 10 mg by mouth daily.    Historical Provider, MD  metolazone (ZAROXOLYN) 2.5 MG tablet Take 1 tablet (2.5 mg total) by mouth daily as needed (for leg swellings or shortness of breath). 04/12/14   Nishant Dhungel, MD  metoprolol tartrate (LOPRESSOR) 25 MG tablet Take 1 tablet (25 mg total) by mouth 2 (two) times daily. 10/03/13   Eugenie Filler, MD  Multiple Vitamin (MULTIVITAMIN WITH MINERALS) TABS tablet Take 1 tablet by mouth daily.    Historical Provider, MD  omeprazole (PRILOSEC) 40 MG capsule Take 40 mg by mouth daily.      Historical Provider, MD  ondansetron (ZOFRAN ODT) 8 MG disintegrating tablet Take 1 tablet (8 mg total) by mouth every 8 (eight) hours as needed for nausea or vomiting. 05/06/14   Jola Schmidt, MD   promethazine (PHENERGAN) 25 MG tablet Take 1 tablet (25 mg total) by mouth every 6 (six) hours as needed for nausea or vomiting. 09/14/13   Theodis Blaze, MD  SUMAtriptan (IMITREX) 50 MG tablet Take 50 mg by mouth 2 (two) times daily as needed for migraine. May repeat in 2 hours if headache persists or recurs.    Historical Provider, MD  torsemide (DEMADEX) 20 MG tablet Take 2 tablets (40 mg total) by mouth daily. 04/12/14   Nishant Dhungel, MD  traZODone (DESYREL) 50 MG tablet Take 50 mg by mouth at bedtime.    Historical Provider, MD  vitamin C (ASCORBIC ACID) 500 MG tablet Take 500 mg by mouth daily.    Historical Provider, MD   BP 124/72 mmHg  Pulse 83  Temp(Src) 98.3 F (36.8 C) (Oral)  Resp 16  Ht $R'5\' 7"'zW$  (1.702 m)  Wt 232 lb (105.235 kg)  BMI 36.33 kg/m2  SpO2 99%   Physical Exam  Constitutional: She is oriented to person, place, and time. She appears well-developed and well-nourished. No distress.  HENT:  Head: Normocephalic and atraumatic.  Nose: Nose normal.  Mouth/Throat: Oropharynx is clear and moist.  Eyes: Conjunctivae and EOM are normal. Pupils are equal, round, and reactive to light.  Neck: Normal range of motion. Neck supple. No JVD present. No tracheal deviation present. No thyromegaly present.  Cardiovascular: Normal rate, regular rhythm, normal heart sounds and intact distal pulses.  Exam reveals no gallop and no friction rub.   No murmur heard. Pulmonary/Chest: Effort normal and breath sounds normal. No stridor. No respiratory distress. She has no wheezes. She has no rales. She exhibits no tenderness.  Abdominal: Soft. Bowel sounds are normal. She exhibits no distension and no mass. There is no tenderness. There is no rebound and no guarding.  Musculoskeletal: Normal range of motion. She exhibits tenderness. She exhibits no edema.  Left knee with diffuse pain.  Mild warmth, no erythema.  No crepitus no overlying skin changes.  Lymphadenopathy:    She has no cervical  adenopathy.  Neurological: She is alert and oriented to person, place, and time. She displays normal reflexes. She exhibits normal muscle tone. Coordination normal.  Skin: Skin is warm and dry. No rash noted. No erythema. No pallor.  Psychiatric: She has a normal mood and affect. Her behavior is normal. Judgment and thought content normal.  Nursing note and vitals reviewed.   ED Course  Procedures (including critical care time) DIAGNOSTIC STUDIES: Oxygen Saturation is 99% on RA, normal by my interpretation.    COORDINATION OF CARE: 1:47 AM-Discussed treatment plan  with pt at bedside and pt agreed to plan.     Labs Review Labs Reviewed  CBC - Abnormal; Notable for the following:    RBC 3.46 (*)    Hemoglobin 8.5 (*)    HCT 26.1 (*)    MCV 75.4 (*)    MCH 24.6 (*)    All other components within normal limits  BASIC METABOLIC PANEL - Abnormal; Notable for the following:    BUN 43 (*)    Creatinine, Ser 2.46 (*)    Calcium 7.9 (*)    GFR calc non Af Amer 23 (*)    GFR calc Af Amer 27 (*)    All other components within normal limits  I-STAT TROPOININ, ED  I-STAT TROPOININ, ED    Imaging Review No results found.   EKG Interpretation   Date/Time:  Saturday June 09 2014 20:55:32 EDT Ventricular Rate:  85 PR Interval:  146 QRS Duration: 86 QT Interval:  392 QTC Calculation: 466 R Axis:   29 Text Interpretation:  Normal sinus rhythm Cannot rule out Anterior infarct  , age undetermined Abnormal ECG Confirmed by Braylen Denunzio  MD, Shakeya Kerkman ([id number]) on  06/10/2014 1:36:21 AM      MDM   Final diagnoses:  CKD stage 3 followed at [facility]  Atypical chest pain  Muscle cramps  Left knee pain     I personally performed the services described in this documentation, which was scribed in my presence. The recorded information has been reviewed and is accurate.  39 year old female with diffuse muscle cramping earlier this evening, chest pressure.  Delta troponin negative.  EKG without  ischemic changes.  Patient has had recent stress test and echo.  She has good follow-up with her group at [facility].  Left knee with mild warmth, but no effusion erythema.  Patient instructed to use ice and follow-up for recheck    Linton Flemings, MD 06/10/14 [phone]

## 2014-06-10 NOTE — ED Notes (Signed)
The pt has been to nurse first 3 times asking how long is the wait.  Slow getting rooms holding  Admissions for upstairs

## 2014-06-27 ENCOUNTER — Emergency Department (HOSPITAL_COMMUNITY)
Admission: EM | Admit: 2014-06-27 | Discharge: 2014-06-28 | Disposition: A | Discharge status: Home / Self Care | Payer: Medicaid Other | Attending: Emergency Medicine | Admitting: Emergency Medicine

## 2014-06-27 ENCOUNTER — Encounter (HOSPITAL_COMMUNITY): Payer: Self-pay | Admitting: *Deleted

## 2014-06-27 DIAGNOSIS — Z79899 Other long term (current) drug therapy: Secondary | ICD-10-CM | POA: Insufficient documentation

## 2014-06-27 DIAGNOSIS — I129 Hypertensive chronic kidney disease with stage 1 through stage 4 chronic kidney disease, or unspecified chronic kidney disease: Secondary | ICD-10-CM | POA: Diagnosis not present

## 2014-06-27 DIAGNOSIS — E780 Pure hypercholesterolemia: Secondary | ICD-10-CM | POA: Diagnosis not present

## 2014-06-27 DIAGNOSIS — Z7982 Long term (current) use of aspirin: Secondary | ICD-10-CM | POA: Insufficient documentation

## 2014-06-27 DIAGNOSIS — Z862 Personal history of diseases of the blood and blood-forming organs and certain disorders involving the immune mechanism: Secondary | ICD-10-CM | POA: Insufficient documentation

## 2014-06-27 DIAGNOSIS — R2243 Localized swelling, mass and lump, lower limb, bilateral: Secondary | ICD-10-CM | POA: Insufficient documentation

## 2014-06-27 DIAGNOSIS — E119 Type 2 diabetes mellitus without complications: Secondary | ICD-10-CM | POA: Diagnosis not present

## 2014-06-27 DIAGNOSIS — R0602 Shortness of breath: Secondary | ICD-10-CM

## 2014-06-27 DIAGNOSIS — K219 Gastro-esophageal reflux disease without esophagitis: Secondary | ICD-10-CM | POA: Diagnosis not present

## 2014-06-27 DIAGNOSIS — R5383 Other fatigue: Secondary | ICD-10-CM | POA: Diagnosis present

## 2014-06-27 DIAGNOSIS — R51 Headache: Secondary | ICD-10-CM | POA: Insufficient documentation

## 2014-06-27 DIAGNOSIS — N183 Chronic kidney disease, stage 3 (moderate): Secondary | ICD-10-CM | POA: Diagnosis not present

## 2014-06-27 DIAGNOSIS — R110 Nausea: Secondary | ICD-10-CM | POA: Insufficient documentation

## 2014-06-27 DIAGNOSIS — E8770 Fluid overload, unspecified: Secondary | ICD-10-CM | POA: Insufficient documentation

## 2014-06-27 LAB — BASIC METABOLIC PANEL
ANION GAP: 6 (ref 5–15)
BUN: 29 mg/dL — AB (ref 6–20)
CHLORIDE: 113 mmol/L — AB (ref 101–111)
CO2: 21 mmol/L — AB (ref 22–32)
CREATININE: 2.01 mg/dL — AB (ref 0.44–1.00)
Calcium: 7.5 mg/dL — ABNORMAL LOW (ref 8.9–10.3)
GFR calc Af Amer: 35 mL/min — ABNORMAL LOW (ref >60)
GFR calc non Af Amer: 30 mL/min — ABNORMAL LOW (ref >60)
Glucose, Bld: 118 mg/dL — ABNORMAL HIGH (ref 65–99)
Potassium: 3.3 mmol/L — ABNORMAL LOW (ref 3.5–5.1)
Sodium: 140 mmol/L (ref 135–145)

## 2014-06-27 LAB — CBC WITH DIFFERENTIAL/PLATELET
BASOS PCT: 0 % (ref 0–1)
Basophils Absolute: 0 10*3/uL (ref 0.0–0.1)
EOS ABS: 0.2 10*3/uL (ref 0.0–0.7)
EOS PCT: 3 % (ref 0–5)
HEMATOCRIT: 27.3 % — AB (ref 36.0–46.0)
HEMOGLOBIN: 8.8 g/dL — AB (ref 12.0–15.0)
LYMPHS ABS: 1.7 10*3/uL (ref 0.7–4.0)
Lymphocytes Relative: 33 % (ref 12–46)
MCH: 24.5 pg — ABNORMAL LOW (ref 26.0–34.0)
MCHC: 32.2 g/dL (ref 30.0–36.0)
MCV: 76 fL — AB (ref 78.0–100.0)
MONO ABS: 0.4 10*3/uL (ref 0.1–1.0)
Monocytes Relative: 7 % (ref 3–12)
Neutro Abs: 2.9 10*3/uL (ref 1.7–7.7)
Neutrophils Relative %: 57 % (ref 43–77)
Platelets: 320 10*3/uL (ref 150–400)
RBC: 3.59 MIL/uL — AB (ref 3.87–5.11)
RDW: 15.4 % (ref 11.5–15.5)
WBC: 5.1 10*3/uL (ref 4.0–10.5)

## 2014-06-27 LAB — BRAIN NATRIURETIC PEPTIDE: B NATRIURETIC PEPTIDE 5: 130.8 pg/mL — AB (ref 0.0–100.0)

## 2014-06-27 MED ORDER — METOCLOPRAMIDE HCL 5 MG/ML IJ SOLN
10.0000 mg | Freq: Once | INTRAMUSCULAR | Status: AC
  Administered 2014-06-28: 10 mg via INTRAVENOUS
  Filled 2014-06-27: qty 2

## 2014-06-27 MED ORDER — METHYLPREDNISOLONE SODIUM SUCC 125 MG IJ SOLR
125.0000 mg | Freq: Once | INTRAMUSCULAR | Status: AC
  Administered 2014-06-28: 125 mg via INTRAVENOUS
  Filled 2014-06-27: qty 2

## 2014-06-27 MED ORDER — DIPHENHYDRAMINE HCL 50 MG/ML IJ SOLN
25.0000 mg | Freq: Once | INTRAMUSCULAR | Status: AC
  Administered 2014-06-28: 25 mg via INTRAVENOUS
  Filled 2014-06-27: qty 1

## 2014-06-27 NOTE — ED Notes (Signed)
Pt reports difficulty breathing when lying flat, increased BP on home machine, states "I can tell I'm retaining fluid."

## 2014-06-27 NOTE — ED Provider Notes (Signed)
CSN: [id number]     Arrival date & time 06/27/14  2156 History  This chart was scribed for Loren Racer, MD by Bronson Curb, ED Scribe. This patient was seen in room B19C/B19C and the patient's care was started at 11:38 PM.   Chief Complaint  Patient presents with  . Fatigue  . Shortness of Breath  . Headache    The history is provided by the patient. No language interpreter was used.     HPI Comments: Patricia [name] is a 39 y.o. female, with history of HTN, DM, CKD, and migraines, who presents to the Emergency Department complaining of elevated blood pressure for the past 2 days. Patient states she checked her BP at home and the lowest reading was 150/98 (triage BP 148/74). She notes associated central headache with nausea. She reports history of migraines that is treated with Imitrex. However, she states this HA does not feel similar to her typical migraines and denies any relief with this medication. Patient also suspects she is retaining fluid, stating her abdomen is distended and notes mild BLE swelling. Patient also states she has gained approximately 11 pounds (236-247) when weighed by her PCP 2 weeks ago. Patient states she experiences difficulty breathing when lying flat. She denies changes in her dosage of Torsemide. Patient has past surgical history of hysterectomy. She denies vomiting or photophobia.   Past Medical History  Diagnosis Date  . Hypertension   . Acid reflux   . High cholesterol   . History of blood transfusion "a couple"    "related to low counts"  . Headache(784.0)     "related to chemo; sometimes weekly" (09/12/2013)  . Type II diabetes mellitus     "took me off my RX ~ 04/2013"  . Iron deficiency anemia     "get epogen shots q month" (02/20/2014)  . Chronic kidney disease (CKD), stage III (moderate)   . MCGN (minimal change glomerulonephritis)     "using chemo to tx;  finished my last tx in 12/2013"   Past Surgical History  Procedure Laterality Date   . Ankle fracture surgery Right 1994  . Abdominal hysterectomy  2010    "laparoscopic"  . Fracture surgery    . Cardiac catheterization  2000's   Family History  Problem Relation Age of Onset  . Hypertension Mother   . Thyroid disease Mother   . Coronary artery disease Father   . Hypertension Father   . Diabetes Father    History  Substance Use Topics  . Smoking status: Never Smoker   . Smokeless tobacco: Never Used  . Alcohol Use: No   OB History    Gravida Para Term Preterm AB TAB SAB Ectopic Multiple Living   3        3      Review of Systems  Constitutional: Positive for fatigue. Negative for fever and chills.  Eyes: Negative for photophobia and visual disturbance.  Respiratory: Positive for shortness of breath. Negative for cough.   Cardiovascular: Positive for leg swelling. Negative for chest pain and palpitations.  Gastrointestinal: Positive for nausea. Negative for vomiting, abdominal pain, diarrhea and constipation.  Genitourinary: Negative for dysuria and frequency.  Musculoskeletal: Negative for myalgias, back pain, neck pain and neck stiffness.  Skin: Negative for rash and wound.  Neurological: Positive for headaches. Negative for dizziness, syncope, weakness, light-headedness and numbness.  All other systems reviewed and are negative.     Allergies  Nsaids  Home Medications   Prior to Admission  medications   Medication Sig Start Date End Date Taking? Authorizing Provider  amLODipine (NORVASC) 10 MG tablet Take 10 mg by mouth daily.    Historical Provider, MD  aspirin 81 MG chewable tablet Chew 1 tablet (81 mg total) by mouth daily. 10/03/13   Eugenie Filler, MD  atorvastatin (LIPITOR) 40 MG tablet Take 40 mg by mouth daily. 03/14/14   Historical Provider, MD  cephALEXin (KEFLEX) 500 MG capsule Take 1 capsule (500 mg total) by mouth 4 (four) times daily. Patient not taking: Reported on 05/06/2014 05/01/14   Janne Napoleon, NP  ciprofloxacin (CIPRO) 500 MG  tablet Take 500 mg by mouth 2 (two) times daily. 7 day course started 05/01/14 05/01/14   Historical Provider, MD  HYDROcodone-acetaminophen (NORCO/VICODIN) 5-325 MG per tablet Take 1 tablet by mouth every 6 (six) hours as needed for moderate pain. 05/06/14   Jola Schmidt, MD  lisinopril (PRINIVIL,ZESTRIL) 10 MG tablet Take 10 mg by mouth daily.    Historical Provider, MD  metolazone (ZAROXOLYN) 2.5 MG tablet Take 1 tablet (2.5 mg total) by mouth daily as needed (for leg swellings or shortness of breath). 04/12/14   Nishant Dhungel, MD  metoprolol tartrate (LOPRESSOR) 25 MG tablet Take 1 tablet (25 mg total) by mouth 2 (two) times daily. 10/03/13   Eugenie Filler, MD  Multiple Vitamin (MULTIVITAMIN WITH MINERALS) TABS tablet Take 1 tablet by mouth daily.    Historical Provider, MD  omeprazole (PRILOSEC) 40 MG capsule Take 40 mg by mouth daily.      Historical Provider, MD  ondansetron (ZOFRAN ODT) 8 MG disintegrating tablet Take 1 tablet (8 mg total) by mouth every 8 (eight) hours as needed for nausea or vomiting. 05/06/14   Jola Schmidt, MD  promethazine (PHENERGAN) 25 MG tablet Take 1 tablet (25 mg total) by mouth every 6 (six) hours as needed for nausea or vomiting. 09/14/13   Theodis Blaze, MD  SUMAtriptan (IMITREX) 50 MG tablet Take 50 mg by mouth 2 (two) times daily as needed for migraine. May repeat in 2 hours if headache persists or recurs.    Historical Provider, MD  torsemide (DEMADEX) 20 MG tablet Take 2 tablets (40 mg total) by mouth daily. 04/12/14   Nishant Dhungel, MD  traZODone (DESYREL) 50 MG tablet Take 50 mg by mouth at bedtime.    Historical Provider, MD  vitamin C (ASCORBIC ACID) 500 MG tablet Take 500 mg by mouth daily.    Historical Provider, MD   Triage Vitals: BP 148/74 mmHg  Pulse 60  Temp(Src) 98.4 F (36.9 C) (Oral)  Resp 18  Ht $R'5\' 7"'Kc$  (1.702 m)  Wt 247 lb (112.038 kg)  BMI 38.68 kg/m2  SpO2 100%  Physical Exam  Constitutional: She is oriented to person, place, and time.  She appears well-developed and well-nourished. No distress.  HENT:  Head: Normocephalic and atraumatic.  Mouth/Throat: Oropharynx is clear and moist. No oropharyngeal exudate.  Eyes: EOM are normal. Pupils are equal, round, and reactive to light.  Neck: Normal range of motion. Neck supple.  No meningismus  Cardiovascular: Normal rate and regular rhythm.   Pulmonary/Chest: Effort normal and breath sounds normal. No respiratory distress. She has no wheezes. She has no rales.  Abdominal: Soft. Bowel sounds are normal. She exhibits distension. She exhibits no mass. There is no tenderness. There is no rebound and no guarding.  Musculoskeletal: Normal range of motion. She exhibits edema (1+ bilateral lower extremity pitting edema.). She exhibits no tenderness.  No  CVA tenderness bilaterally.  Neurological: She is alert and oriented to person, place, and time.  5/5 motor in all extremities. Sensation is fully intact.  Skin: Skin is warm and dry. No rash noted. No erythema.  Psychiatric: She has a normal mood and affect. Her behavior is normal.  Nursing note and vitals reviewed.   ED Course  Procedures (including critical care time)  DIAGNOSTIC STUDIES: Oxygen Saturation is 100% on room air, normal by my interpretation.    COORDINATION OF CARE: At 2343 Discussed treatment plan with patient which includes CXR, UA and labs. Patient agrees.   Labs Review Labs Reviewed  BASIC METABOLIC PANEL - Abnormal; Notable for the following:    Potassium 3.3 (*)    Chloride 113 (*)    CO2 21 (*)    Glucose, Bld 118 (*)    BUN 29 (*)    Creatinine, Ser 2.01 (*)    Calcium 7.5 (*)    GFR calc non Af Amer 30 (*)    GFR calc Af Amer 35 (*)    All other components within normal limits  BRAIN NATRIURETIC PEPTIDE - Abnormal; Notable for the following:    B Natriuretic Peptide 130.8 (*)    All other components within normal limits  CBC WITH DIFFERENTIAL/PLATELET - Abnormal; Notable for the following:     RBC 3.59 (*)    Hemoglobin 8.8 (*)    HCT 27.3 (*)    MCV 76.0 (*)    MCH 24.5 (*)    All other components within normal limits  HEPATIC FUNCTION PANEL - Abnormal; Notable for the following:    Total Protein 4.8 (*)    Albumin 1.7 (*)    ALT 12 (*)    Total Bilirubin 0.2 (*)    Bilirubin, Direct <0.1 (*)    All other components within normal limits  URINALYSIS, ROUTINE W REFLEX MICROSCOPIC (NOT AT [facility]) - Abnormal; Notable for the following:    Glucose, UA 100 (*)    Hgb urine dipstick SMALL (*)    Protein, ur >300 (*)    All other components within normal limits  URINE MICROSCOPIC-ADD ON  I-STAT TROPOININ, ED    Imaging Review Dg Abd Acute W/chest  06/28/2014   CLINICAL DATA:  Acute onset of shortness of breath, abdominal pain and nausea. Initial encounter.  EXAM: DG ABDOMEN ACUTE W/ 1V CHEST  COMPARISON:  Chest radiograph from 05/06/2014, and abdominal radiograph from 05/01/2014  FINDINGS: The lungs are well-aerated and clear. There is no evidence of focal opacification, pleural effusion or pneumothorax. The cardiomediastinal silhouette is within normal limits.  The visualized bowel gas pattern is unremarkable. Scattered stool and air are seen within the colon; there is no evidence of small bowel dilatation to suggest obstruction. No free intra-abdominal air is identified on the provided upright view.  No acute osseous abnormalities are seen; the sacroiliac joints are unremarkable in appearance.  IMPRESSION: 1. Unremarkable bowel gas pattern; no free intra-abdominal air seen. Small amount of stool noted in the colon. 2. No acute cardiopulmonary process seen.   Electronically Signed   By: Garald Balding M.D.   On: 06/28/2014 01:24     EKG Interpretation None      MDM   Final diagnoses:  SOB (shortness of breath)  Hypervolemia, unspecified hypervolemia type    I personally performed the services described in this documentation, which was scribed in my presence. The recorded  information has been reviewed and is accurate. Patient diuresed well in  the emergency department. States she is feeling much better. Headache is improved. Advised to follow-up with her nephrologist. She's been given return precautions as well as understanding.   Julianne Rice, MD 06/28/14 [phone]

## 2014-06-27 NOTE — ED Notes (Signed)
Patient transported to X-ray 

## 2014-06-28 ENCOUNTER — Encounter (HOSPITAL_COMMUNITY): Payer: Self-pay | Admitting: Emergency Medicine

## 2014-06-28 ENCOUNTER — Emergency Department (HOSPITAL_COMMUNITY): Payer: Medicaid Other

## 2014-06-28 DIAGNOSIS — K219 Gastro-esophageal reflux disease without esophagitis: Secondary | ICD-10-CM

## 2014-06-28 DIAGNOSIS — I129 Hypertensive chronic kidney disease with stage 1 through stage 4 chronic kidney disease, or unspecified chronic kidney disease: Secondary | ICD-10-CM

## 2014-06-28 DIAGNOSIS — Z79899 Other long term (current) drug therapy: Secondary | ICD-10-CM | POA: Insufficient documentation

## 2014-06-28 DIAGNOSIS — N183 Chronic kidney disease, stage 3 (moderate): Secondary | ICD-10-CM | POA: Insufficient documentation

## 2014-06-28 DIAGNOSIS — E119 Type 2 diabetes mellitus without complications: Secondary | ICD-10-CM

## 2014-06-28 DIAGNOSIS — E8770 Fluid overload, unspecified: Secondary | ICD-10-CM

## 2014-06-28 DIAGNOSIS — E780 Pure hypercholesterolemia: Secondary | ICD-10-CM

## 2014-06-28 DIAGNOSIS — Z7982 Long term (current) use of aspirin: Secondary | ICD-10-CM | POA: Insufficient documentation

## 2014-06-28 DIAGNOSIS — Z862 Personal history of diseases of the blood and blood-forming organs and certain disorders involving the immune mechanism: Secondary | ICD-10-CM

## 2014-06-28 DIAGNOSIS — R109 Unspecified abdominal pain: Secondary | ICD-10-CM | POA: Insufficient documentation

## 2014-06-28 LAB — CBC WITH DIFFERENTIAL/PLATELET
Basophils Absolute: 0 10*3/uL (ref 0.0–0.1)
Basophils Relative: 0 % (ref 0–1)
Eosinophils Absolute: 0 10*3/uL (ref 0.0–0.7)
Eosinophils Relative: 0 % (ref 0–5)
HCT: 28.6 % — ABNORMAL LOW (ref 36.0–46.0)
Hemoglobin: 9.3 g/dL — ABNORMAL LOW (ref 12.0–15.0)
LYMPHS ABS: 1.4 10*3/uL (ref 0.7–4.0)
LYMPHS PCT: 12 % (ref 12–46)
MCH: 24.6 pg — AB (ref 26.0–34.0)
MCHC: 32.5 g/dL (ref 30.0–36.0)
MCV: 75.7 fL — ABNORMAL LOW (ref 78.0–100.0)
MONOS PCT: 7 % (ref 3–12)
Monocytes Absolute: 0.8 10*3/uL (ref 0.1–1.0)
NEUTROS PCT: 81 % — AB (ref 43–77)
Neutro Abs: 9.2 10*3/uL — ABNORMAL HIGH (ref 1.7–7.7)
Platelets: 357 10*3/uL (ref 150–400)
RBC: 3.78 MIL/uL — AB (ref 3.87–5.11)
RDW: 15.3 % (ref 11.5–15.5)
WBC: 11.4 10*3/uL — AB (ref 4.0–10.5)

## 2014-06-28 LAB — URINALYSIS, ROUTINE W REFLEX MICROSCOPIC
BILIRUBIN URINE: NEGATIVE
Glucose, UA: 100 mg/dL — AB
KETONES UR: NEGATIVE mg/dL
Leukocytes, UA: NEGATIVE
NITRITE: NEGATIVE
Protein, ur: >300 mg/dL — AB
Specific Gravity, Urine: 1.011 (ref 1.005–1.030)
UROBILINOGEN UA: 0.2 mg/dL (ref 0.0–1.0)
pH: 5.5 (ref 5.0–8.0)

## 2014-06-28 LAB — I-STAT CHEM 8, ED
BUN: 21 mg/dL — AB (ref 6–20)
BUN: 39 mg/dL — ABNORMAL HIGH (ref 6–20)
CALCIUM ION: 1.11 mmol/L — AB (ref 1.12–1.23)
CHLORIDE: 101 mmol/L (ref 101–111)
CREATININE: 2.8 mg/dL — AB (ref 0.44–1.00)
Calcium, Ion: 1.11 mmol/L — ABNORMAL LOW (ref 1.12–1.23)
Chloride: 106 mmol/L (ref 101–111)
Creatinine, Ser: 0.8 mg/dL (ref 0.44–1.00)
GLUCOSE: 171 mg/dL — AB (ref 65–99)
GLUCOSE: 117 mg/dL — AB (ref 65–99)
HCT: 27 % — ABNORMAL LOW (ref 36.0–46.0)
HCT: 44 % (ref 36.0–46.0)
Hemoglobin: 15 g/dL (ref 12.0–15.0)
Hemoglobin: 9.2 g/dL — ABNORMAL LOW (ref 12.0–15.0)
Potassium: 3.5 mmol/L (ref 3.5–5.1)
Potassium: 3.7 mmol/L (ref 3.5–5.1)
Sodium: 137 mmol/L (ref 135–145)
Sodium: 138 mmol/L (ref 135–145)
TCO2: 21 mmol/L (ref 0–100)
TCO2: 19 mmol/L (ref 0–100)

## 2014-06-28 LAB — URINE MICROSCOPIC-ADD ON

## 2014-06-28 LAB — HEPATIC FUNCTION PANEL
ALT: 12 U/L — ABNORMAL LOW (ref 14–54)
AST: 21 U/L (ref 15–41)
Albumin: 1.7 g/dL — ABNORMAL LOW (ref 3.5–5.0)
Alkaline Phosphatase: 39 U/L (ref 38–126)
BILIRUBIN TOTAL: 0.2 mg/dL — AB (ref 0.3–1.2)
Total Protein: 4.8 g/dL — ABNORMAL LOW (ref 6.5–8.1)

## 2014-06-28 MED ORDER — FUROSEMIDE 10 MG/ML IJ SOLN
80.0000 mg | Freq: Once | INTRAMUSCULAR | Status: AC
Start: 2014-06-28 — End: 2014-06-28
  Administered 2014-06-28: 80 mg via INTRAVENOUS
  Filled 2014-06-28: qty 8

## 2014-06-28 NOTE — ED Notes (Signed)
Pt st's she was seen here last pm for shortness of breath and was given Lasix IV.  Pt st's she followed up with her MD today and was told to increase her fluid meds.  Pt st's she did this but has only voided sm amount since then.  Pt c/o shortness of breath when laying down.

## 2014-06-28 NOTE — Discharge Instructions (Signed)
Sodium and Fluid Restriction Some health conditions may require you to restrict your sodium and fluid intake. Sodium is part of the salt in the blood. Sodium may be restricted because when you take in a lot of salt, you become thirsty. Limiting salt with help you become less thirsty and may make it easier to restrict fluid. Talk to your caregiver or dietician about how many cups of fluid and how many milligrams of sodium you are allowed each day. If your caregiver has restricted your sodium and fluids, usually the amount you can drink depends on several things, such as:  Your urine output.  How much fluid you are retaining.  Your blood pressure. Every 2 cups (500 mL) of fluid retained in the body becomes an extra 1 pound (0.5 kg) of body weight. The following are examples of some fluids you will have to restrict:  Tea, coffee, soda, lemonade, milk, water, and juice.  Alcoholic beverages.  Cream.  Gravy.  Ice cubes.  Soup and broth. The following are foods that become liquid at room temperature. These foods will count towards your fluid intake.  Ice cream and ice milk.  Frozen yogurt and sherbet.  Frozen ice pops.  Flavored gelatin. YOU MAY BE TAKING IN TOO MUCH FLUID IF:  Your weight increases.  Your face, hands, legs, feet, and abdomen start to swell.  You have trouble breathing. HOME CARE INSTRUCTIONS If you follow a low sodium diet closely, you will eat approximately 1,500 mg of sodium a day.   Avoid salty foods. This increases your thirst and makes fluid control more difficult. Foods high in sodium include:  Most canned foods, including most meats.  Most processed foods, including most meats.  Cheese.  Dried pasta and rice mixes.  Snack foods (chips, popcorn, pretzels, cheese puffs, salted nuts).  Dips, sauces, and salad dressings.  Do not use salt in cooking or add salt to your meal. Cook with herbs and spices, but not those that have salt in the name. Ask  your caregiver if it is okay to use salt substitutes.  Eat home-prepared meals. Use fresh ingredients. Avoid canned, frozen, or packaged meals.  Read food labels to see how much sodium is in the food. Know how much sodium you are allowed each day.  When eating out, ask for dressings and sauces on the side.  Weigh yourself every morning with an empty bladder before you eat or drink. If your weight is going up, you are retaining fluid.  Freeze fruit juice or water in an ice cube tray. Use this as part of your fluid allowance.  Brush your teeth often or rinse your mouth with mouthwash to help your dry mouth. Lemon wedges, hard sour candies, chewing gum, or breath spray may help to moisten your mouth too.  Add a slice of fresh lemon or lemon juice to water or ice. This helps satisfy your thirst.  Try frozen fruits between meals, such as grapes or strawberries.  Swallow your pills along with meals or soft foods. This helps you save your fluid for something you enjoy.  Use small cups and glasses and learn to sip fluids slowly.  Keep your home cooler. Keep the air in your home as humid as possible. Dry air increases thirst.  Avoid being out in the hot sun. Each morning, fill a jug with the amount of water you are allowed for the day. You can use this water as a guideline for fluid allowance. Each time you take in   fluid, pour an equal amount of water out of the container. This helps you to see how much fluid you are taking in. It also helps plan your fluid intake for the rest of the day. CONVERSIONS TO HELP MEASURE FLUID INTAKE  1 cup equals 8 oz (240 mL).   cup equals 6 oz (180 mL).   cup equals 5  oz (160 mL).   cup equals 4 oz (120 mL).   cup equals 2  oz (80 mL).   cup equals 2 oz (60 mL).  2 tbs equals 1 oz (30 mL). Document Released: 10/19/2006 Document Revised: 06/23/2011 Document Reviewed: 06/05/2011 [employer] Patient Information 2015 [employer]. This information is  not intended to replace advice given to you by your health care provider. Make sure you discuss any questions you have with your health care provider.  

## 2014-06-28 NOTE — ED Notes (Signed)
Bedside commode placed at bedside.  

## 2014-06-29 ENCOUNTER — Emergency Department (HOSPITAL_COMMUNITY)
Admission: EM | Admit: 2014-06-29 | Discharge: 2014-06-29 | Disposition: A | Discharge status: Home / Self Care | Payer: Medicaid Other | Source: Home / Self Care | Attending: Emergency Medicine | Admitting: Emergency Medicine

## 2014-06-29 ENCOUNTER — Emergency Department (HOSPITAL_COMMUNITY): Payer: Medicaid Other

## 2014-06-29 DIAGNOSIS — E8770 Fluid overload, unspecified: Secondary | ICD-10-CM

## 2014-06-29 DIAGNOSIS — R109 Unspecified abdominal pain: Secondary | ICD-10-CM

## 2014-06-29 LAB — BRAIN NATRIURETIC PEPTIDE: B NATRIURETIC PEPTIDE 5: 67.5 pg/mL (ref 0.0–100.0)

## 2014-06-29 MED ORDER — FUROSEMIDE 10 MG/ML IJ SOLN
80.0000 mg | Freq: Once | INTRAMUSCULAR | Status: AC
  Administered 2014-06-29: 80 mg via INTRAVENOUS
  Filled 2014-06-29: qty 8

## 2014-06-29 MED ORDER — BUMETANIDE 1 MG PO TABS: 1.0000 mg | ORAL_TABLET | Freq: Two times a day (BID) | ORAL | Status: DC

## 2014-06-29 NOTE — ED Provider Notes (Signed)
CSN: [id number]     Arrival date & time 06/28/14  2214 History  This chart was scribed for Everlene Balls, MD by Evelene Croon, ED Scribe. This patient was seen in room D36C/D36C and the patient's care was started 12:29 AM.    Chief Complaint  Patient presents with  . Shortness of Breath   The history is provided by the patient. No language interpreter was used.     HPI Comments:  Patricia [name] is a 39 y.o. female with a h/o chronic kidney disease who presents to the Emergency Department complaining of mild gradual onset SOB for one day.  Pt states she was seen in the ED on 6/23 for SOB and fatigue and given IV lasix with little improvement. She reports associated abdominal distention and right sided flank pain; pain has improved since onset. Pt notes she called her on call nephrologist at [facility] to further increase lasix 160 mg torsemide $RemoveBefo'20mg'iQPAPNrUAph$  metolazone without relief and advised to come to the ED. She denies vomiting and abnormal urine color. She also denies h/o kidney stones.   Past Medical History  Diagnosis Date  . Hypertension   . Acid reflux   . High cholesterol   . History of blood transfusion "a couple"    "related to low counts"  . Headache(784.0)     "related to chemo; sometimes weekly" (09/12/2013)  . Type II diabetes mellitus     "took me off my RX ~ 04/2013"  . Iron deficiency anemia     "get epogen shots q month" (02/20/2014)  . Chronic kidney disease (CKD), stage III (moderate)   . 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 Ankle fracture surgery Right 1994  . Abdominal hysterectomy  2010    "laparoscopic"  . Fracture surgery    . Cardiac catheterization  2000's   Family History  Problem Relation Age of Onset  . Hypertension Mother   . Thyroid disease Mother   . Coronary artery disease Father   . Hypertension Father   . Diabetes Father    History   Substance Use Topics  . Smoking status: Never Smoker   . Smokeless tobacco: Never Used  . Alcohol Use: No   OB History    Gravida Para Term Preterm AB TAB SAB Ectopic Multiple Living   3        3      Review of Systems  A complete 10 system review of systems was obtained and all systems are negative except as noted in the HPI and PMH.    Allergies  Nsaids  Home Medications   Prior to Admission medications   Medication Sig Start Date End Date Taking? Authorizing Provider  amLODipine (NORVASC) 10 MG tablet Take 10 mg by mouth daily.    Historical Provider, MD  aspirin 81 MG chewable tablet Chew 1 tablet (81 mg total) by mouth daily. 10/03/13   Eugenie Filler, MD  atorvastatin (LIPITOR) 40 MG tablet Take 40 mg by mouth daily. 03/14/14   Historical Provider, MD  cephALEXin (KEFLEX) 500 MG capsule Take 1 capsule (500 mg total) by mouth 4 (four) times daily. Patient not taking: Reported on 05/06/2014 05/01/14   Janne Napoleon, NP  ciprofloxacin (CIPRO) 500 MG tablet Take 500 mg by mouth 2 (two) times daily. 7 day course started 05/01/14 05/01/14   Historical Provider, MD  HYDROcodone-acetaminophen (  NORCO/VICODIN) 5-325 MG per tablet Take 1 tablet by mouth every 6 (six) hours as needed for moderate pain. 05/06/14   Jola Schmidt, MD  lisinopril (PRINIVIL,ZESTRIL) 10 MG tablet Take 10 mg by mouth daily.    Historical Provider, MD  metolazone (ZAROXOLYN) 2.5 MG tablet Take 1 tablet (2.5 mg total) by mouth daily as needed (for leg swellings or shortness of breath). 04/12/14   Nishant Dhungel, MD  metoprolol tartrate (LOPRESSOR) 25 MG tablet Take 1 tablet (25 mg total) by mouth 2 (two) times daily. 10/03/13   Eugenie Filler, MD  Multiple Vitamin (MULTIVITAMIN WITH MINERALS) TABS tablet Take 1 tablet by mouth daily.    Historical Provider, MD  omeprazole (PRILOSEC) 40 MG capsule Take 40 mg by mouth daily.      Historical Provider, MD  ondansetron (ZOFRAN ODT) 8 MG disintegrating tablet Take 1 tablet (8  mg total) by mouth every 8 (eight) hours as needed for nausea or vomiting. 05/06/14   Jola Schmidt, MD  promethazine (PHENERGAN) 25 MG tablet Take 1 tablet (25 mg total) by mouth every 6 (six) hours as needed for nausea or vomiting. 09/14/13   Theodis Blaze, MD  SUMAtriptan (IMITREX) 50 MG tablet Take 50 mg by mouth 2 (two) times daily as needed for migraine. May repeat in 2 hours if headache persists or recurs.    Historical Provider, MD  torsemide (DEMADEX) 20 MG tablet Take 2 tablets (40 mg total) by mouth daily. 04/12/14   Nishant Dhungel, MD  traZODone (DESYREL) 50 MG tablet Take 50 mg by mouth at bedtime.    Historical Provider, MD  vitamin C (ASCORBIC ACID) 500 MG tablet Take 500 mg by mouth daily.    Historical Provider, MD   BP 139/70 mmHg  Pulse 70  Temp(Src) 98.5 F (36.9 C) (Oral)  Resp 18  Ht $R'5\' 7"'vk$  (1.702 m)  Wt 247 lb 8 oz (112.265 kg)  BMI 38.75 kg/m2  SpO2 100% Physical Exam  Constitutional: She is oriented to person, place, and time. She appears well-developed and well-nourished. No distress.  HENT:  Head: Normocephalic and atraumatic.  Nose: Nose normal.  Mouth/Throat: Oropharynx is clear and moist. No oropharyngeal exudate.  Eyes: Conjunctivae and EOM are normal. Pupils are equal, round, and reactive to light. No scleral icterus.  Neck: Normal range of motion. Neck supple. No JVD present. No tracheal deviation present. No thyromegaly present.  Cardiovascular: Normal rate, regular rhythm and normal heart sounds.  Exam reveals no gallop and no friction rub.   No murmur heard. Pulmonary/Chest: Effort normal and breath sounds normal. No respiratory distress. She has no wheezes. She exhibits no tenderness.  Abdominal: Soft. Bowel sounds are normal. She exhibits no distension and no mass. There is no tenderness. There is no rebound and no guarding.  Musculoskeletal: Normal range of motion. She exhibits edema. She exhibits no tenderness.  Lymphadenopathy:    She has no cervical  adenopathy.  Neurological: She is alert and oriented to person, place, and time. No cranial nerve deficit. She exhibits normal muscle tone.  Skin: Skin is warm and dry. No rash noted. No erythema. No pallor.  Nursing note and vitals reviewed.   ED Course  Procedures   DIAGNOSTIC STUDIES:  Oxygen Saturation is 100% on RA, normal by my interpretation.    COORDINATION OF CARE:  12:37 AM Discussed treatment plan with pt at bedside and pt agreed to plan.   Labs Review Labs Reviewed  CBC WITH DIFFERENTIAL/PLATELET - Abnormal; Notable  for the following:    WBC 11.4 (*)    RBC 3.78 (*)    Hemoglobin 9.3 (*)    HCT 28.6 (*)    MCV 75.7 (*)    MCH 24.6 (*)    Neutrophils Relative % 81 (*)    Neutro Abs 9.2 (*)    All other components within normal limits  I-STAT CHEM 8, ED - Abnormal; Notable for the following:    BUN 21 (*)    Glucose, Bld 171 (*)    Calcium, Ion 1.11 (*)    All other components within normal limits  I-STAT CHEM 8, ED - Abnormal; Notable for the following:    BUN 39 (*)    Creatinine, Ser 2.80 (*)    Glucose, Bld 117 (*)    Calcium, Ion 1.11 (*)    Hemoglobin 9.2 (*)    HCT 27.0 (*)    All other components within normal limits  BRAIN NATRIURETIC PEPTIDE    Imaging Review Ct Abdomen Pelvis Wo Contrast  06/29/2014   CLINICAL DATA:  Chronic renal failure for 2 years. Shortness of breath and right flank pain. Initial encounter.  EXAM: CT ABDOMEN AND PELVIS WITHOUT CONTRAST  TECHNIQUE: Multidetector CT imaging of the abdomen and pelvis was performed following the standard protocol without IV contrast.  COMPARISON:  Abdominal ultrasound performed 05/06/2014, and CT of the abdomen and pelvis performed 03/24/2012  FINDINGS: The visualized lung bases are clear.  The liver and spleen are unremarkable in appearance. The gallbladder is within normal limits. The pancreas and adrenal glands are unremarkable.  The kidneys are unremarkable in appearance. There is no evidence  of hydronephrosis. No renal or ureteral stones are seen. No perinephric stranding is appreciated.  No free fluid is identified. The small bowel is unremarkable in appearance. The stomach is within normal limits. No acute vascular abnormalities are seen.  A small anterior abdominal wall hernia is noted superior to the umbilicus, containing only fat. This is stable from 2014.  Minimal nonspecific soft tissue edema is noted along the anterior abdominal wall.  The appendix is normal in caliber, without evidence of appendicitis. A few diverticula are seen along the descending colon, without evidence of diverticulitis. The colon is otherwise unremarkable in appearance.  The bladder is mildly distended and grossly unremarkable. The patient is status post hysterectomy. The ovaries are grossly symmetric. No suspicious adnexal masses are seen. No inguinal lymphadenopathy is seen.  No acute osseous abnormalities are identified. Vacuum phenomenon and sclerosis are seen at the sacroiliac joints bilaterally.  IMPRESSION: 1. No acute abnormality seen within the abdomen or pelvis. 2. Minimal diverticulosis along the descending colon, without evidence of diverticulitis. 3. Small anterior abdominal wall hernia noted superior to the umbilicus, containing only fat, stable from 2014. 4. Minimal nonspecific soft tissue edema noted along the anterior abdominal wall.   Electronically Signed   By: Garald Balding M.D.   On: 06/29/2014 02:09   Dg Abd Acute W/chest  06/28/2014   CLINICAL DATA:  Acute onset of shortness of breath, abdominal pain and nausea. Initial encounter.  EXAM: DG ABDOMEN ACUTE W/ 1V CHEST  COMPARISON:  Chest radiograph from 05/06/2014, and abdominal radiograph from 05/01/2014  FINDINGS: The lungs are well-aerated and clear. There is no evidence of focal opacification, pleural effusion or pneumothorax. The cardiomediastinal silhouette is within normal limits.  The visualized bowel gas pattern is unremarkable. Scattered  stool and air are seen within the colon; there is no evidence of small bowel  dilatation to suggest obstruction. No free intra-abdominal air is identified on the provided upright view.  No acute osseous abnormalities are seen; the sacroiliac joints are unremarkable in appearance.  IMPRESSION: 1. Unremarkable bowel gas pattern; no free intra-abdominal air seen. Small amount of stool noted in the colon. 2. No acute cardiopulmonary process seen.   Electronically Signed   By: Garald Balding M.D.   On: 06/28/2014 01:24     EKG Interpretation None      MDM   Final diagnoses:  None   Patient since emergency department for worsening shortness of breath and fluid overload. She has fluid overload kidney disease. Last time she required IV Lasix, repeated 80 mg dose of IV Lasix. I spoke with nephrology on call at [facility], they recommend adding Bumex for 3 days, 1 mg twice a day. This is related to the patient. Patient encouraged to follow-up with them next week. She is not hypoxic, she overall appears well in no acute distress. Her vital signs were within her normal limits and she is safe for discharge.  I personally performed the services described in this documentation, which was scribed in my presence. The recorded information has been reviewed and is accurate.    Everlene Balls, MD 06/29/14 [phone]

## 2014-06-29 NOTE — Discharge Instructions (Signed)
Chronic Kidney Disease Patricia [name], take bumex twice a day for 3 days to help take fluid off.  See your nephrologist next week for close follow up.  If symptoms worsen, come back to the ED immediately.  Thank you. Chronic kidney disease occurs when the kidneys are damaged over a long period. The kidneys are two organs that lie on either side of the spine between the middle of the back and the front of the abdomen. The kidneys:   Remove wastes and extra water from the blood.   Produce important hormones. These help keep bones strong, regulate blood pressure, and help create red blood cells.   Balance the fluids and chemicals in the blood and tissues. A small amount of kidney damage may not cause problems, but a large amount of damage may make it difficult or impossible for the kidneys to work the way they should. If steps are not taken to slow down the kidney damage or stop it from getting worse, the kidneys may stop working permanently. Most of the time, chronic kidney disease does not go away. However, it can often be controlled, and those with the disease can usually live normal lives. CAUSES  The most common causes of chronic kidney disease are diabetes and high blood pressure (hypertension). Chronic kidney disease may also be caused by:   Diseases that cause the kidneys' filters to become inflamed.   Diseases that affect the immune system.   Genetic diseases.   Medicines that damage the kidneys, such as anti-inflammatory medicines.  Poisoning or exposure to toxic substances.   A reoccurring kidney or urinary infection.   A problem with urine flow. This may be caused by:   Cancer.   Kidney stones.   An enlarged prostate in males. SIGNS AND SYMPTOMS  Because the kidney damage in chronic kidney disease occurs slowly, symptoms develop slowly and may not be obvious until the kidney damage becomes severe. A person may have a kidney disease for years without showing any  symptoms. Symptoms can include:   Swelling (edema) of the legs, ankles, or feet.   Tiredness (lethargy).   Nausea or vomiting.   Confusion.   Problems with urination, such as:   Decreased urine production.   Frequent urination, especially at night.   Frequent accidents in children who are potty trained.   Muscle twitches and cramps.   Shortness of breath.  Weakness.   Persistent itchiness.   Loss of appetite.  Metallic taste in the mouth.  Trouble sleeping.  Slowed development in children.  Short stature in children. DIAGNOSIS  Chronic kidney disease may be detected and diagnosed by tests, including blood, urine, imaging, or kidney biopsy tests.  TREATMENT  Most chronic kidney diseases cannot be cured. Treatment usually involves relieving symptoms and preventing or slowing the progression of the disease. Treatment may include:   A special diet. You may need to avoid alcohol and foods thatare salty and high in potassium.   Medicines. These may:   Lower blood pressure.   Relieve anemia.   Relieve swelling.   Protect the bones. HOME CARE INSTRUCTIONS   Follow your prescribed diet.   Take medicines only as directed by your health care provider. Do not take any new medicines (prescription, over-the-counter, or nutritional supplements) unless approved by your health care provider. Many medicines can worsen your kidney damage or need to have the dose adjusted.   Quit smoking if you smoke. Talk to your health care provider about a smoking cessation program.  Keep all follow-up visits as directed by your health care provider. SEEK IMMEDIATE MEDICAL CARE IF:  Your symptoms get worse or you develop new symptoms.   You develop symptoms of end-stage kidney disease. These include:   Headaches.   Abnormally dark or light skin.   Numbness in the hands or feet.   Easy bruising.   Frequent hiccups.   Menstruation stops.   You  have a fever.   You have decreased urine production.   You havepain or bleeding when urinating. MAKE SURE YOU:  Understand these instructions.  Will watch your condition.  Will get help right away if you are not doing well or get worse. FOR MORE INFORMATION   American Association of Kidney Patients: BombTimer.gl  National Kidney Foundation: www.kidney.[place]: https://mathis.com/  [facility]: www.lifeoptions.org and www.kidneyschool.org Document Released: 10/01/2007 Document Revised: 05/08/2013 Document Reviewed: 08/21/2011 [facility] Patient Information 2015 [place], [place]. This information is not intended to replace advice given to you by your health care provider. Make sure you discuss any questions you have with your health care provider.

## 2014-08-20 ENCOUNTER — Emergency Department (HOSPITAL_COMMUNITY)
Admission: EM | Admit: 2014-08-20 | Discharge: 2014-08-21 | Disposition: A | Discharge status: Home / Self Care | Payer: Medicaid Other | Attending: Emergency Medicine | Admitting: Emergency Medicine

## 2014-08-20 ENCOUNTER — Emergency Department (HOSPITAL_COMMUNITY): Payer: Medicaid Other

## 2014-08-20 ENCOUNTER — Encounter (HOSPITAL_COMMUNITY): Payer: Self-pay | Admitting: *Deleted

## 2014-08-20 DIAGNOSIS — R0789 Other chest pain: Secondary | ICD-10-CM | POA: Insufficient documentation

## 2014-08-20 DIAGNOSIS — N183 Chronic kidney disease, stage 3 (moderate): Secondary | ICD-10-CM | POA: Insufficient documentation

## 2014-08-20 DIAGNOSIS — Z862 Personal history of diseases of the blood and blood-forming organs and certain disorders involving the immune mechanism: Secondary | ICD-10-CM | POA: Diagnosis not present

## 2014-08-20 DIAGNOSIS — E119 Type 2 diabetes mellitus without complications: Secondary | ICD-10-CM | POA: Insufficient documentation

## 2014-08-20 DIAGNOSIS — K219 Gastro-esophageal reflux disease without esophagitis: Secondary | ICD-10-CM | POA: Diagnosis not present

## 2014-08-20 DIAGNOSIS — E782 Mixed hyperlipidemia: Secondary | ICD-10-CM | POA: Diagnosis not present

## 2014-08-20 DIAGNOSIS — I129 Hypertensive chronic kidney disease with stage 1 through stage 4 chronic kidney disease, or unspecified chronic kidney disease: Secondary | ICD-10-CM | POA: Insufficient documentation

## 2014-08-20 DIAGNOSIS — Z3202 Encounter for pregnancy test, result negative: Secondary | ICD-10-CM | POA: Diagnosis not present

## 2014-08-20 DIAGNOSIS — M545 Low back pain: Secondary | ICD-10-CM | POA: Insufficient documentation

## 2014-08-20 DIAGNOSIS — R079 Chest pain, unspecified: Secondary | ICD-10-CM | POA: Diagnosis present

## 2014-08-20 DIAGNOSIS — Z7982 Long term (current) use of aspirin: Secondary | ICD-10-CM | POA: Diagnosis not present

## 2014-08-20 DIAGNOSIS — Z79899 Other long term (current) drug therapy: Secondary | ICD-10-CM | POA: Insufficient documentation

## 2014-08-20 LAB — URINALYSIS, ROUTINE W REFLEX MICROSCOPIC
BILIRUBIN URINE: NEGATIVE
Glucose, UA: 100 mg/dL — AB
KETONES UR: NEGATIVE mg/dL
Leukocytes, UA: NEGATIVE
NITRITE: NEGATIVE
Specific Gravity, Urine: 1.012 (ref 1.005–1.030)
UROBILINOGEN UA: 0.2 mg/dL (ref 0.0–1.0)
pH: 6 (ref 5.0–8.0)

## 2014-08-20 LAB — CBC
HEMATOCRIT: 27.3 % — AB (ref 36.0–46.0)
Hemoglobin: 8.7 g/dL — ABNORMAL LOW (ref 12.0–15.0)
MCH: 24.8 pg — ABNORMAL LOW (ref 26.0–34.0)
MCHC: 31.9 g/dL (ref 30.0–36.0)
MCV: 77.8 fL — AB (ref 78.0–100.0)
PLATELETS: 344 10*3/uL (ref 150–400)
RBC: 3.51 MIL/uL — ABNORMAL LOW (ref 3.87–5.11)
RDW: 14.3 % (ref 11.5–15.5)
WBC: 6.1 10*3/uL (ref 4.0–10.5)

## 2014-08-20 LAB — URINE MICROSCOPIC-ADD ON

## 2014-08-20 NOTE — ED Notes (Signed)
Patient presents stating she started with left chest pain earlier today with nausea, diaphoresis, dizziness and lower back pain

## 2014-08-20 NOTE — ED Notes (Signed)
Patient transported to X-ray 

## 2014-08-20 NOTE — ED Provider Notes (Signed)
CSN: [id number]     Arrival date & time 08/20/14  2229 History  This chart was scribed for Mesa Janus, MD by Hansel Feinstein, ED Scribe. This patient was seen in room D32C/D32C and the patient's care was started at 12:49 AM.     Chief Complaint  Patient presents with  . Chest Pain  . Back Pain   Patient is a 39 y.o. female presenting with chest pain and back pain. The history is provided by the patient. No language interpreter was used.  Chest Pain Pain location:  Epigastric Pain quality: pressure   Pain radiates to:  Does not radiate Pain radiates to the back: no   Pain severity:  Moderate Onset quality:  Gradual Duration:  1 day Timing:  Constant Progression:  Unchanged Chronicity:  Recurrent Context: movement   Context: not breathing and no stress   Relieved by:  None tried Worsened by:  Nothing tried Ineffective treatments:  None tried Associated symptoms: no back pain, no cough, no fever, no headache, no palpitations, no shortness of breath, not vomiting and no weakness   Risk factors: no aortic disease   Back Pain Associated symptoms: chest pain   Associated symptoms: no fever, no headaches and no weakness     HPI Comments: Patricia [name] is a 39 y.o. female with Hx of HTN, acid reflux, HTN, Type II DM, CKD, MCGN who presents to the Emergency Department complaining of moderate CP "pressure" onset tonight. She states associated dizziness, decreased appetite, chills. Pt states that food does not affect her pain. She states Hx of similar Sx. Pt notes no treatments tried for previous episodes. No treatments tried PTA. She states that she does not work. She is taking Demadex. She denies changes in bowel movements.   Past Medical History  Diagnosis Date  . Hypertension   . Acid reflux   . High cholesterol   . History of blood transfusion "a couple"    "related to low counts"  . Headache(784.0)     "related to chemo; sometimes weekly" (09/12/2013)  . Type II diabetes mellitus      "took me off my RX ~ 04/2013"  . Iron deficiency anemia     "get epogen shots q month" (02/20/2014)  . Chronic kidney disease (CKD), stage III (moderate)   . 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 Ankle fracture surgery Right 1994  . Abdominal hysterectomy  2010    "laparoscopic"  . Fracture surgery    . Cardiac catheterization  2000's   Family History  Problem Relation Age of Onset  . Hypertension Mother   . Thyroid disease Mother   . Coronary artery disease Father   . Hypertension Father   . Diabetes Father    Social History  Substance Use Topics  . Smoking status: Never Smoker   . Smokeless tobacco: Never Used  . Alcohol Use: No   OB History    Gravida Para Term Preterm AB TAB SAB Ectopic Multiple Living   3        3      Review of Systems  Constitutional: Positive for chills and appetite change. Negative for fever.  Respiratory: Negative for cough, chest tightness and shortness of breath.   Cardiovascular: Positive for chest pain. Negative for palpitations and leg swelling.  Gastrointestinal: Negative for vomiting.  Musculoskeletal: Negative for back pain.  Neurological: Negative for weakness and headaches.  All other systems reviewed and are negative.     Allergies  Nsaids  Home Medications   Prior to Admission medications   Medication Sig Start Date End Date Taking? Authorizing Provider  amLODipine (NORVASC) 5 MG tablet Take 5 mg by mouth daily. 08/12/14  Yes Historical Provider, MD  aspirin 81 MG chewable tablet Chew 1 tablet (81 mg total) by mouth daily. 10/03/13  Yes Eugenie Filler, MD  atorvastatin (LIPITOR) 40 MG tablet Take 40 mg by mouth daily. 03/14/14  Yes Historical Provider, MD  calcium acetate (PHOSLO) 667 MG tablet Take 1,334 mg by mouth 3 (three) times daily. 08/02/14 08/02/15 Yes Historical Provider, MD  Cholecalciferol (VITAMIN  D-1000 MAX ST) 1000 UNITS tablet Take 1,000 Units by mouth daily. 08/02/14 08/02/15 Yes Historical Provider, MD  corticotropin (ATHACAR H.P.) 80 UNIT/ML injectable gel Inject 1 mL into the muscle 3 (three) times a week. On Tuesday, Thursday and Saturday 08/02/14  Yes Historical Provider, MD  lisinopril (PRINIVIL,ZESTRIL) 10 MG tablet Take 10 mg by mouth daily.   Yes Historical Provider, MD  metolazone (ZAROXOLYN) 2.5 MG tablet Take 1 tablet (2.5 mg total) by mouth daily as needed (for leg swellings or shortness of breath). 04/12/14  Yes Nishant Dhungel, MD  metoprolol tartrate (LOPRESSOR) 25 MG tablet Take 1 tablet (25 mg total) by mouth 2 (two) times daily. 10/03/13  Yes Eugenie Filler, MD  Multiple Vitamin (MULTIVITAMIN WITH MINERALS) TABS tablet Take 1 tablet by mouth daily.   Yes Historical Provider, MD  omeprazole (PRILOSEC) 40 MG capsule Take 40 mg by mouth daily.     Yes Historical Provider, MD  potassium chloride (K-DUR) 10 MEQ tablet Take 10 mEq by mouth daily. 08/12/14  Yes Historical Provider, MD  SUMAtriptan (IMITREX) 50 MG tablet Take 50 mg by mouth 2 (two) times daily as needed for migraine. May repeat in 2 hours if headache persists or recurs.   Yes Historical Provider, MD  torsemide (DEMADEX) 20 MG tablet Take 2 tablets (40 mg total) by mouth daily. 04/12/14  Yes Nishant Dhungel, MD  traZODone (DESYREL) 50 MG tablet Take 50 mg by mouth at bedtime.   Yes Historical Provider, MD  vitamin C (ASCORBIC ACID) 500 MG tablet Take 500 mg by mouth daily.   Yes Historical Provider, MD  bumetanide (BUMEX) 1 MG tablet Take 1 tablet (1 mg total) by mouth 2 (two) times daily. Patient not taking: Reported on 08/20/2014 06/29/14   Everlene Balls, MD  cephALEXin (KEFLEX) 500 MG capsule Take 1 capsule (500 mg total) by mouth 4 (four) times daily. Patient not taking: Reported on 05/06/2014 05/01/14   Janne Napoleon, NP  HYDROcodone-acetaminophen (NORCO/VICODIN) 5-325 MG per tablet Take 1 tablet by mouth every 6 (six) hours  as needed for moderate pain. Patient not taking: Reported on 08/20/2014 05/06/14   Jola Schmidt, MD  ondansetron [facility] ODT) 8 MG disintegrating tablet Take 1 tablet (8 mg total) by mouth every 8 (eight) hours as needed for nausea or vomiting. Patient not taking: Reported on 08/20/2014 05/06/14   Jola Schmidt, MD  promethazine (PHENERGAN) 25 MG tablet Take 1 tablet (25 mg total) by mouth every 6 (six) hours as needed for nausea or vomiting. Patient not taking: Reported on 08/20/2014 09/14/13   Theodis Blaze, MD   BP 126/69 mmHg  Pulse 64  Temp(Src) 98.1 F (36.7 C) (Oral)  Resp 21  Ht $R'5\' 7"'Gq$  (1.702 m)  Wt 239 lb (108.41 kg)  BMI 37.42 kg/m2  SpO2 100% Physical Exam  Constitutional: She is oriented to person, place, and time. She appears well-developed and well-nourished.  HENT:  Head: Normocephalic and atraumatic.  Mouth/Throat: Oropharynx is clear and moist. No oropharyngeal exudate.  Eyes: Conjunctivae and EOM are normal. Pupils are equal, round, and reactive to light.  Neck: Normal range of motion. Neck supple.  Cardiovascular: Normal rate.   Pulmonary/Chest: Effort normal and breath sounds normal. No respiratory distress. She has no wheezes. She has no rales. She exhibits tenderness.  Pin and spasm on the right lateral chest wall  Abdominal: Soft. Bowel sounds are normal. She exhibits no distension. There is no tenderness. There is no rebound and no guarding.  Hyperactive bowel sounds throughout   Musculoskeletal: Normal range of motion. She exhibits no edema or tenderness.  Muscle spasm in the intercostal posterior line.   Lymphadenopathy:    She has no cervical adenopathy.  Neurological: She is alert and oriented to person, place, and time. She has normal reflexes.  Skin: Skin is warm and dry.  Psychiatric: She has a normal mood and affect. Her behavior is normal.  Nursing note and vitals reviewed.   ED Course  Procedures (including critical care time) DIAGNOSTIC  STUDIES: Oxygen Saturation is 100% on RA, normal by my interpretation.    COORDINATION OF CARE: 12:52 AM Discussed treatment plan with pt at bedside and pt agreed to plan.   Labs Review Labs Reviewed  CBC - Abnormal; Notable for the following:    RBC 3.51 (*)    Hemoglobin 8.7 (*)    HCT 27.3 (*)    MCV 77.8 (*)    MCH 24.8 (*)    All other components within normal limits  COMPREHENSIVE METABOLIC PANEL - Abnormal; Notable for the following:    Glucose, Bld 109 (*)    BUN 67 (*)    Creatinine, Ser 3.17 (*)    Calcium 7.9 (*)    Total Protein 5.4 (*)    Albumin 2.2 (*)    Total Bilirubin 0.2 (*)    GFR calc non Af Amer 17 (*)    GFR calc Af Amer 20 (*)    All other components within normal limits  URINALYSIS, ROUTINE W REFLEX MICROSCOPIC (NOT AT [facility]) - Abnormal; Notable for the following:    APPearance CLOUDY (*)    Glucose, UA 100 (*)    Hgb urine dipstick SMALL (*)    Protein, ur >300 (*)    All other components within normal limits  URINE MICROSCOPIC-ADD ON - Abnormal; Notable for the following:    Bacteria, UA FEW (*)    Casts HYALINE CASTS (*)    All other components within normal limits  TROPONIN I  I-STAT BETA HCG BLOOD, ED ([facility], [facility], [facility] ONLY)    Imaging Review Dg Chest 2 View  08/20/2014   CLINICAL DATA:  Acute onset of left-sided chest pain and shortness of breath. Initial encounter.  EXAM: CHEST  2 VIEW  COMPARISON:  Chest radiograph performed 06/27/2014  FINDINGS: The lungs are mildly hypoexpanded and appear grossly clear. There is no evidence of focal opacification, pleural effusion or pneumothorax.  The heart is borderline normal in size. No acute osseous abnormalities are seen.  IMPRESSION: Lungs mildly hypoexpanded and grossly clear.   Electronically Signed   By: Garald Balding M.D.   On: 08/20/2014 23:46   I, personally reviewed and evaluated these images and lab results as part of my medical decision-making.  EKG Interpretation   Date/Time:  Monday  August 20 2014 22:35:16 EDT Ventricular Rate:  69 PR Interval:  150 QRS Duration: 90 QT Interval:  426 QTC Calculation: 456 R Axis:   56 Text Interpretation:  Normal sinus rhythm Confirmed by [facility]  MD,  Angellee Cohill ([id number]) on 08/20/2014 11:36:16 PM      MDM   Final diagnoses:  None   PERC negative wells 0, highly doubt PE.  In the setting of ongoing symptoms a normal ekg and troponin excludes ACS.  Pain is reproducible and consistent with MSK pain.  WIll treat with muscle relaxants and lidoderm patches   I personally performed the services described in this documentation, which was scribed in my presence. The recorded information has been reviewed and is accurate.    Veatrice Kells, MD 08/21/14 [phone]

## 2014-08-21 ENCOUNTER — Encounter (HOSPITAL_COMMUNITY): Payer: Self-pay | Admitting: Emergency Medicine

## 2014-08-21 LAB — COMPREHENSIVE METABOLIC PANEL
ALK PHOS: 40 U/L (ref 38–126)
ALT: 14 U/L (ref 14–54)
ANION GAP: 12 (ref 5–15)
AST: 22 U/L (ref 15–41)
Albumin: 2.2 g/dL — ABNORMAL LOW (ref 3.5–5.0)
BILIRUBIN TOTAL: 0.2 mg/dL — AB (ref 0.3–1.2)
BUN: 67 mg/dL — ABNORMAL HIGH (ref 6–20)
CALCIUM: 7.9 mg/dL — AB (ref 8.9–10.3)
CO2: 23 mmol/L (ref 22–32)
Chloride: 104 mmol/L (ref 101–111)
Creatinine, Ser: 3.17 mg/dL — ABNORMAL HIGH (ref 0.44–1.00)
GFR calc non Af Amer: 17 mL/min — ABNORMAL LOW (ref >60)
GFR, EST AFRICAN AMERICAN: 20 mL/min — AB (ref >60)
Glucose, Bld: 109 mg/dL — ABNORMAL HIGH (ref 65–99)
Potassium: 3.7 mmol/L (ref 3.5–5.1)
SODIUM: 139 mmol/L (ref 135–145)
TOTAL PROTEIN: 5.4 g/dL — AB (ref 6.5–8.1)

## 2014-08-21 LAB — I-STAT BETA HCG BLOOD, ED (MC, WL, AP ONLY)

## 2014-08-21 LAB — TROPONIN I: Troponin I: <0.03 ng/mL (ref <0.031)

## 2014-08-21 MED ORDER — METHOCARBAMOL 500 MG PO TABS
1000.0000 mg | ORAL_TABLET | Freq: Once | ORAL | Status: AC
  Administered 2014-08-21: 1000 mg via ORAL
  Filled 2014-08-21: qty 2

## 2014-08-21 MED ORDER — GI COCKTAIL ~~LOC~~
30.0000 mL | Freq: Once | ORAL | Status: AC
  Administered 2014-08-21: 30 mL via ORAL
  Filled 2014-08-21: qty 30

## 2014-08-21 MED ORDER — METHOCARBAMOL 500 MG PO TABS: 500.0000 mg | ORAL_TABLET | Freq: Two times a day (BID) | ORAL | Status: DC

## 2014-08-21 MED ORDER — LIDOCAINE 5 % EX PTCH: 1.0000 | MEDICATED_PATCH | CUTANEOUS | Status: DC

## 2014-08-21 NOTE — Discharge Instructions (Signed)
Chest Pain (Nonspecific) It is often hard to give a diagnosis for the cause of chest pain. There is always a chance that your pain could be related to something serious, such as a heart attack or a blood clot in the lungs. You need to follow up with your doctor. HOME CARE  If antibiotic medicine was given, take it as directed by your doctor. Finish the medicine even if you start to feel better.  For the next few days, avoid activities that bring on chest pain. Continue physical activities as told by your doctor.  Do not use any tobacco products. This includes cigarettes, chewing tobacco, and e-cigarettes.  Avoid drinking alcohol.  Only take medicine as told by your doctor.  Follow your doctor's suggestions for more testing if your chest pain does not go away.  Keep all doctor visits you made. GET HELP IF:  Your chest pain does not go away, even after treatment.  You have a rash with blisters on your chest.  You have a fever. GET HELP RIGHT AWAY IF:   You have more pain or pain that spreads to your arm, neck, jaw, back, or belly (abdomen).  You have shortness of breath.  You cough more than usual or cough up blood.  You have very bad back or belly pain.  You feel sick to your stomach (nauseous) or throw up (vomit).  You have very bad weakness.  You pass out (faint).  You have chills. This is an emergency. Do not wait to see if the problems will go away. Call your local emergency services (911 in [place] Do not drive yourself to the hospital. MAKE SURE YOU:   Understand these instructions.  Will watch your condition.  Will get help right away if you are not doing well or get worse. Document Released: 06/10/2007 Document Revised: 12/27/2012 Document Reviewed: 06/10/2007 [facility] Patient Information 2015 [place], [place]. This information is not intended to replace advice given to you by your health care provider. Make sure you discuss any questions you have with your  health care provider.  Chest Wall Pain Chest wall pain is pain in or around the bones and muscles of your chest. It may take up to 6 weeks to get better. It may take longer if you must stay physically active in your work and activities.  CAUSES  Chest wall pain may happen on its own. However, it may be caused by:  A viral illness like the flu.  Injury.  Coughing.  Exercise.  Arthritis.  Fibromyalgia.  Shingles. HOME CARE INSTRUCTIONS   Avoid overtiring physical activity. Try not to strain or perform activities that cause pain. This includes any activities using your chest or your abdominal and side muscles, especially if heavy weights are used.  Put ice on the sore area.  Put ice in a plastic bag.  Place a towel between your skin and the bag.  Leave the ice on for 15-20 minutes per hour while awake for the first 2 days.  Only take over-the-counter or prescription medicines for pain, discomfort, or fever as directed by your caregiver. SEEK IMMEDIATE MEDICAL CARE IF:   Your pain increases, or you are very uncomfortable.  You have a fever.  Your chest pain becomes worse.  You have new, unexplained symptoms.  You have nausea or vomiting.  You feel sweaty or lightheaded.  You have a cough with phlegm (sputum), or you cough up blood. MAKE SURE YOU:   Understand these instructions.  Will watch your  condition.  Will get help right away if you are not doing well or get worse. Document Released: 12/22/2004 Document Revised: 03/16/2011 Document Reviewed: 08/18/2010 [facility] Patient Information 2015 [place], [place]. This information is not intended to replace advice given to you by your health care provider. Make sure you discuss any questions you have with your health care provider.

## 2014-08-22 LAB — URINE CULTURE

## 2014-09-03 ENCOUNTER — Emergency Department (HOSPITAL_COMMUNITY)
Admission: EM | Admit: 2014-09-03 | Discharge: 2014-09-03 | Discharge status: Against Medical Advice | Payer: Medicaid Other | Attending: Emergency Medicine | Admitting: Emergency Medicine

## 2014-09-03 ENCOUNTER — Encounter (HOSPITAL_COMMUNITY): Payer: Self-pay | Admitting: *Deleted

## 2014-09-03 DIAGNOSIS — I129 Hypertensive chronic kidney disease with stage 1 through stage 4 chronic kidney disease, or unspecified chronic kidney disease: Secondary | ICD-10-CM | POA: Insufficient documentation

## 2014-09-03 DIAGNOSIS — E119 Type 2 diabetes mellitus without complications: Secondary | ICD-10-CM | POA: Insufficient documentation

## 2014-09-03 DIAGNOSIS — R109 Unspecified abdominal pain: Secondary | ICD-10-CM | POA: Diagnosis present

## 2014-09-03 DIAGNOSIS — N183 Chronic kidney disease, stage 3 (moderate): Secondary | ICD-10-CM | POA: Diagnosis not present

## 2014-09-03 LAB — COMPREHENSIVE METABOLIC PANEL
ALK PHOS: 37 U/L — AB (ref 38–126)
ALT: 12 U/L — AB (ref 14–54)
AST: 17 U/L (ref 15–41)
Albumin: 2.1 g/dL — ABNORMAL LOW (ref 3.5–5.0)
Anion gap: 8 (ref 5–15)
BILIRUBIN TOTAL: 0.6 mg/dL (ref 0.3–1.2)
BUN: 61 mg/dL — AB (ref 6–20)
CALCIUM: 7.7 mg/dL — AB (ref 8.9–10.3)
CHLORIDE: 107 mmol/L (ref 101–111)
CO2: 23 mmol/L (ref 22–32)
CREATININE: 2.81 mg/dL — AB (ref 0.44–1.00)
GFR calc Af Amer: 23 mL/min — ABNORMAL LOW (ref >60)
GFR, EST NON AFRICAN AMERICAN: 20 mL/min — AB (ref >60)
Glucose, Bld: 108 mg/dL — ABNORMAL HIGH (ref 65–99)
Potassium: 3.8 mmol/L (ref 3.5–5.1)
Sodium: 138 mmol/L (ref 135–145)
Total Protein: 5.2 g/dL — ABNORMAL LOW (ref 6.5–8.1)

## 2014-09-03 LAB — CBC
HCT: 27.8 % — ABNORMAL LOW (ref 36.0–46.0)
Hemoglobin: 8.8 g/dL — ABNORMAL LOW (ref 12.0–15.0)
MCH: 24.4 pg — ABNORMAL LOW (ref 26.0–34.0)
MCHC: 31.7 g/dL (ref 30.0–36.0)
MCV: 77.2 fL — AB (ref 78.0–100.0)
PLATELETS: 309 10*3/uL (ref 150–400)
RBC: 3.6 MIL/uL — ABNORMAL LOW (ref 3.87–5.11)
RDW: 14 % (ref 11.5–15.5)
WBC: 4.3 10*3/uL (ref 4.0–10.5)

## 2014-09-03 NOTE — ED Notes (Signed)
Pt reports right flank pain and dysuria since last night. Pt reports hx of kidney problems, pt's nephrologist told pt to come to ED to be evaluated.

## 2014-09-11 ENCOUNTER — Emergency Department (HOSPITAL_COMMUNITY)
Admission: EM | Admit: 2014-09-11 | Discharge: 2014-09-11 | Disposition: A | Discharge status: Home / Self Care | Payer: Medicaid Other | Attending: Emergency Medicine | Admitting: Emergency Medicine

## 2014-09-11 ENCOUNTER — Emergency Department (HOSPITAL_COMMUNITY): Payer: Medicaid Other

## 2014-09-11 ENCOUNTER — Encounter (HOSPITAL_COMMUNITY): Payer: Self-pay | Admitting: Emergency Medicine

## 2014-09-11 DIAGNOSIS — Z7982 Long term (current) use of aspirin: Secondary | ICD-10-CM | POA: Diagnosis not present

## 2014-09-11 DIAGNOSIS — R079 Chest pain, unspecified: Secondary | ICD-10-CM | POA: Diagnosis present

## 2014-09-11 DIAGNOSIS — E782 Mixed hyperlipidemia: Secondary | ICD-10-CM | POA: Diagnosis not present

## 2014-09-11 DIAGNOSIS — K219 Gastro-esophageal reflux disease without esophagitis: Secondary | ICD-10-CM | POA: Diagnosis not present

## 2014-09-11 DIAGNOSIS — R0602 Shortness of breath: Secondary | ICD-10-CM | POA: Insufficient documentation

## 2014-09-11 DIAGNOSIS — I129 Hypertensive chronic kidney disease with stage 1 through stage 4 chronic kidney disease, or unspecified chronic kidney disease: Secondary | ICD-10-CM | POA: Insufficient documentation

## 2014-09-11 DIAGNOSIS — R0789 Other chest pain: Secondary | ICD-10-CM | POA: Insufficient documentation

## 2014-09-11 DIAGNOSIS — D509 Iron deficiency anemia, unspecified: Secondary | ICD-10-CM | POA: Insufficient documentation

## 2014-09-11 DIAGNOSIS — N183 Chronic kidney disease, stage 3 (moderate): Secondary | ICD-10-CM | POA: Insufficient documentation

## 2014-09-11 DIAGNOSIS — Z79899 Other long term (current) drug therapy: Secondary | ICD-10-CM | POA: Diagnosis not present

## 2014-09-11 DIAGNOSIS — E119 Type 2 diabetes mellitus without complications: Secondary | ICD-10-CM | POA: Insufficient documentation

## 2014-09-11 LAB — CBC
HEMATOCRIT: 30.3 % — AB (ref 36.0–46.0)
Hemoglobin: 9.6 g/dL — ABNORMAL LOW (ref 12.0–15.0)
MCH: 24.8 pg — AB (ref 26.0–34.0)
MCHC: 31.7 g/dL (ref 30.0–36.0)
MCV: 78.3 fL (ref 78.0–100.0)
Platelets: 398 10*3/uL (ref 150–400)
RBC: 3.87 MIL/uL (ref 3.87–5.11)
RDW: 14.4 % (ref 11.5–15.5)
WBC: 8.4 10*3/uL (ref 4.0–10.5)

## 2014-09-11 LAB — BASIC METABOLIC PANEL
Anion gap: 9 (ref 5–15)
BUN: 43 mg/dL — AB (ref 6–20)
CHLORIDE: 109 mmol/L (ref 101–111)
CO2: 21 mmol/L — AB (ref 22–32)
CREATININE: 2.74 mg/dL — AB (ref 0.44–1.00)
Calcium: 8.2 mg/dL — ABNORMAL LOW (ref 8.9–10.3)
GFR calc Af Amer: 24 mL/min — ABNORMAL LOW (ref >60)
GFR calc non Af Amer: 20 mL/min — ABNORMAL LOW (ref >60)
GLUCOSE: 108 mg/dL — AB (ref 65–99)
POTASSIUM: 4 mmol/L (ref 3.5–5.1)
SODIUM: 139 mmol/L (ref 135–145)

## 2014-09-11 LAB — I-STAT TROPONIN, ED
Troponin i, poc: 0 ng/mL (ref 0.00–0.08)
Troponin i, poc: 0 ng/mL (ref 0.00–0.08)

## 2014-09-11 MED ORDER — NITROGLYCERIN 0.4 MG SL SUBL: 0.4000 mg | SUBLINGUAL_TABLET | SUBLINGUAL | Status: DC | PRN

## 2014-09-11 MED ORDER — ONDANSETRON 4 MG PO TBDP
4.0000 mg | ORAL_TABLET | Freq: Once | ORAL | Status: AC
  Administered 2014-09-11: 4 mg via ORAL
  Filled 2014-09-11: qty 1

## 2014-09-11 NOTE — ED Notes (Signed)
Pt. reports left chest " heaviness" onset this evening with SOB and nausea .  Denies diaphoresis / no cough or congestion .

## 2014-09-11 NOTE — ED Notes (Signed)
Pt. Left with all belongings. Discharge instructions were reviewed and all questions were answered.  

## 2014-09-11 NOTE — ED Provider Notes (Signed)
CSN: [id number]     Arrival date & time 09/11/14  0107 History  This chart was scribed for Julianne Rice, MD by Irene Pap, ED Scribe. This patient was seen in room D32C/D32C and patient care was started at 1:44 AM.    Chief Complaint  Patient presents with  . Chest Pain   The history is provided by the patient. No language interpreter was used.   HPI Comments: Patricia [name] is a 39 y.o. Female with a hx of HTN, high cholesterol, and Type II DM who presents to the Emergency Department complaining of left chest pressure onset earlier this evening. Pt states that the pain feels like heaviness and hx of similar symptoms. Reports associated visual disturbances, seeing white dots, SOB, wheezing, decreased appetite, and nausea. Pt states that she has followed up with a cardiologist and said that she looked okay, "they didn't do anything for it." Pt reports taking Tylenol PM and Ativan today. Denies diaphoresis, new leg swelling, vomiting, cough or congestion. Denies hx of smoking, anxiety, or recent long travel.   Past Medical History  Diagnosis Date  . Hypertension   . Acid reflux   . High cholesterol   . History of blood transfusion "a couple"    "related to low counts"  . Headache(784.0)     "related to chemo; sometimes weekly" (09/12/2013)  . Type II diabetes mellitus     "took me off my RX ~ 04/2013"  . Iron deficiency anemia     "get epogen shots q month" (02/20/2014)  . Chronic kidney disease (CKD), stage III (moderate)   . 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 Ankle fracture surgery Right 1994  . Abdominal hysterectomy  2010    "laparoscopic"  . Fracture surgery    . Cardiac catheterization  2000's   Family History  Problem Relation Age of Onset  . Hypertension Mother   . Thyroid disease Mother   . Coronary artery disease Father   . Hypertension Father   .  Diabetes Father    Social History  Substance Use Topics  . Smoking status: Never Smoker   . Smokeless tobacco: Never Used  . Alcohol Use: No   OB History    Gravida Para Term Preterm AB TAB SAB Ectopic Multiple Living   3        3      Review of Systems  Constitutional: Positive for appetite change. Negative for fever and chills.  Respiratory: Positive for chest tightness and shortness of breath. Negative for cough.   Cardiovascular: Positive for chest pain. Negative for palpitations and leg swelling.  Gastrointestinal: Positive for nausea. Negative for vomiting, abdominal pain and diarrhea.  Genitourinary: Negative for dysuria, frequency and flank pain.  Musculoskeletal: Negative for back pain, neck pain and neck stiffness.  Skin: Negative for rash and wound.  Neurological: Negative for dizziness, weakness, light-headedness, numbness and headaches.  All other systems reviewed and are negative.     Allergies  Nsaids  Home Medications   Prior to Admission medications   Medication Sig Start Date End Date Taking? Authorizing Provider  amLODipine (NORVASC) 5 MG tablet Take 5 mg by mouth daily. 08/12/14  Yes Historical Provider, MD  aspirin 81 MG chewable tablet Chew 1 tablet (81 mg total) by mouth daily. 10/03/13  Yes Eugenie Filler, MD  atorvastatin (LIPITOR) 40 MG tablet  Take 40 mg by mouth daily. 03/14/14  Yes Historical Provider, MD  calcium acetate (PHOSLO) 667 MG tablet Take 1,334 mg by mouth 3 (three) times daily. 08/02/14 08/02/15 Yes Historical Provider, MD  Cholecalciferol (VITAMIN D-1000 MAX ST) 1000 UNITS tablet Take 1,000 Units by mouth daily. 08/02/14 08/02/15 Yes Historical Provider, MD  corticotropin (ATHACAR H.P.) 80 UNIT/ML injectable gel Inject 1 mL into the muscle 3 (three) times a week. On Tuesday, Thursday and Saturday 08/02/14  Yes Historical Provider, MD  lidocaine (LIDODERM) 5 % Place 1 patch onto the skin daily. Remove & Discard patch within 12 hours or as  directed by MD 08/21/14  Yes April Palumbo, MD  lisinopril (PRINIVIL,ZESTRIL) 10 MG tablet Take 10 mg by mouth daily.   Yes Historical Provider, MD  methocarbamol (ROBAXIN) 500 MG tablet Take 1 tablet (500 mg total) by mouth 2 (two) times daily. 08/21/14  Yes April Palumbo, MD  metolazone (ZAROXOLYN) 2.5 MG tablet Take 1 tablet (2.5 mg total) by mouth daily as needed (for leg swellings or shortness of breath). 04/12/14  Yes Nishant Dhungel, MD  metoprolol tartrate (LOPRESSOR) 25 MG tablet Take 1 tablet (25 mg total) by mouth 2 (two) times daily. 10/03/13  Yes Rodolph Bong, MD  Multiple Vitamin (MULTIVITAMIN WITH MINERALS) TABS tablet Take 1 tablet by mouth daily.   Yes Historical Provider, MD  omeprazole (PRILOSEC) 40 MG capsule Take 40 mg by mouth daily.     Yes Historical Provider, MD  potassium chloride (K-DUR) 10 MEQ tablet Take 10 mEq by mouth daily. 08/12/14  Yes Historical Provider, MD  SUMAtriptan (IMITREX) 50 MG tablet Take 50 mg by mouth 2 (two) times daily as needed for migraine. May repeat in 2 hours if headache persists or recurs.   Yes Historical Provider, MD  torsemide (DEMADEX) 20 MG tablet Take 2 tablets (40 mg total) by mouth daily. 04/12/14  Yes Nishant Dhungel, MD  traZODone (DESYREL) 50 MG tablet Take 50 mg by mouth at bedtime.   Yes Historical Provider, MD  vitamin C (ASCORBIC ACID) 500 MG tablet Take 500 mg by mouth daily.   Yes Historical Provider, MD   BP 116/72 mmHg  Pulse 64  Temp(Src) 98.3 F (36.8 C) (Oral)  Resp 20  SpO2 99% Physical Exam  Constitutional: She is oriented to person, place, and time. She appears well-developed and well-nourished. No distress.  HENT:  Head: Normocephalic and atraumatic.  Mouth/Throat: Oropharynx is clear and moist.  Eyes: EOM are normal. Pupils are equal, round, and reactive to light.  Neck: Normal range of motion. Neck supple.  Cardiovascular: Normal rate and regular rhythm.   Pulmonary/Chest: Effort normal and breath sounds normal.  No respiratory distress. She has no wheezes. She has no rales. She exhibits no tenderness.  Abdominal: Soft. Bowel sounds are normal. She exhibits no distension and no mass. There is no tenderness. There is no rebound and no guarding.  Musculoskeletal: Normal range of motion. She exhibits no edema or tenderness.  No calf swelling or tenderness.  Neurological: She is alert and oriented to person, place, and time.  Moves all extremities without deficit. Sensation is grossly intact.  Skin: Skin is warm and dry. No rash noted. No erythema.  Psychiatric:  Anxious appearing  Nursing note and vitals reviewed.   ED Course  Procedures (including critical care time) DIAGNOSTIC STUDIES: Oxygen Saturation is 99% on RA, normal by my interpretation.    COORDINATION OF CARE: 1:47 AM-Discussed treatment plan which includes EKG and labs  with pt at bedside and pt agreed to plan.   Labs Review Labs Reviewed  BASIC METABOLIC PANEL - Abnormal; Notable for the following:    CO2 21 (*)    Glucose, Bld 108 (*)    BUN 43 (*)    Creatinine, Ser 2.74 (*)    Calcium 8.2 (*)    GFR calc non Af Amer 20 (*)    GFR calc Af Amer 24 (*)    All other components within normal limits  CBC - Abnormal; Notable for the following:    Hemoglobin 9.6 (*)    HCT 30.3 (*)    MCH 24.8 (*)    All other components within normal limits  I-STAT TROPOININ, ED  Randolm Idol, ED    Imaging Review Dg Chest 2 View  09/11/2014   CLINICAL DATA:  Chest pain and shortness of breath tonight.  EXAM: CHEST  2 VIEW  COMPARISON:  08/20/2014  FINDINGS: The heart size and mediastinal contours are within normal limits. Both lungs are clear. The visualized skeletal structures are unremarkable.  IMPRESSION: No active cardiopulmonary disease.   Electronically Signed   By: Lucienne Capers M.D.   On: 09/11/2014 02:15    EKG Interpretation None     ED ECG REPORT   Date: 09/11/2014  Rate: 64  Rhythm: normal sinus rhythm  QRS Axis:  normal  Intervals: normal  ST/T Wave abnormalities: nonspecific T wave changes  Conduction Disutrbances:none  Narrative Interpretation:   Old EKG Reviewed: changes noted  I have personally reviewed the EKG tracing and agree with the computerized printout as noted.  MDM   Final diagnoses:  Atypical chest pain   I personally performed the services described in this documentation, which was scribed in my presence. The recorded information has been reviewed and is accurate.   Patient is resting comfortably. Troponin 2 are normal. There is questionable T wave changes in her anterior leads. We'll discuss with cardiology but anticipate following up as an outpatient.  Discussed with Dr. Baxter Flattery who reviewed the patient's EKG. Since the patient has had similar EKGs in the past. Myoview performed last year without any ischemic changes. Recommends discharge home to follow-up with cardiology in the office for possible repeat of the [place]. Patient understands and agrees with plan. Return precautions given.  Julianne Rice, MD 09/11/14 [phone]

## 2014-09-11 NOTE — ED Notes (Signed)
Rounded on Pt. Shock pt to make sure she didn't need anything. Pt stayed asleep. Pt still hooked up to pulse ox, bp and heart monitor.

## 2014-09-11 NOTE — Discharge Instructions (Signed)
Call and make an appointment to follow-up with a cardiologist. Return immediately for worsening pain, difficulty breathing or for any concerns.   Chest Pain (Nonspecific) It is often hard to give a specific diagnosis for the cause of chest pain. There is always a chance that your pain could be related to something serious, such as a heart attack or a blood clot in the lungs. You need to follow up with your health care provider for further evaluation. CAUSES   Heartburn.  Pneumonia or bronchitis.  Anxiety or stress.  Inflammation around your heart (pericarditis) or lung (pleuritis or pleurisy).  A blood clot in the lung.  A collapsed lung (pneumothorax). It can develop suddenly on its own (spontaneous pneumothorax) or from trauma to the chest.  Shingles infection (herpes zoster virus). The chest wall is composed of bones, muscles, and cartilage. Any of these can be the source of the pain.  The bones can be bruised by injury.  The muscles or cartilage can be strained by coughing or overwork.  The cartilage can be affected by inflammation and become sore (costochondritis). DIAGNOSIS  Lab tests or other studies may be needed to find the cause of your pain. Your health care provider may have you take a test called an ambulatory electrocardiogram (ECG). An ECG records your heartbeat patterns over a 24-hour period. You may also have other tests, such as:  Transthoracic echocardiogram (TTE). During echocardiography, sound waves are used to evaluate how blood flows through your heart.  Transesophageal echocardiogram (TEE).  Cardiac monitoring. This allows your health care provider to monitor your heart rate and rhythm in real time.  Holter monitor. This is a portable device that records your heartbeat and can help diagnose heart arrhythmias. It allows your health care provider to track your heart activity for several days, if needed.  Stress tests by exercise or by giving medicine that  makes the heart beat faster. TREATMENT   Treatment depends on what may be causing your chest pain. Treatment may include:  Acid blockers for heartburn.  Anti-inflammatory medicine.  Pain medicine for inflammatory conditions.  Antibiotics if an infection is present.  You may be advised to change lifestyle habits. This includes stopping smoking and avoiding alcohol, caffeine, and chocolate.  You may be advised to keep your head raised (elevated) when sleeping. This reduces the chance of acid going backward from your stomach into your esophagus. Most of the time, nonspecific chest pain will improve within 2-3 days with rest and mild pain medicine.  HOME CARE INSTRUCTIONS   If antibiotics were prescribed, take them as directed. Finish them even if you start to feel better.  For the next few days, avoid physical activities that bring on chest pain. Continue physical activities as directed.  Do not use any tobacco products, including cigarettes, chewing tobacco, or electronic cigarettes.  Avoid drinking alcohol.  Only take medicine as directed by your health care provider.  Follow your health care provider's suggestions for further testing if your chest pain does not go away.  Keep any follow-up appointments you made. If you do not go to an appointment, you could develop lasting (chronic) problems with pain. If there is any problem keeping an appointment, call to reschedule. SEEK MEDICAL CARE IF:   Your chest pain does not go away, even after treatment.  You have a rash with blisters on your chest.  You have a fever. SEEK IMMEDIATE MEDICAL CARE IF:   You have increased chest pain or pain that spreads  to your arm, neck, jaw, back, or abdomen.  You have shortness of breath.  You have an increasing cough, or you cough up blood.  You have severe back or abdominal pain.  You feel nauseous or vomit.  You have severe weakness.  You faint.  You have chills. This is an  emergency. Do not wait to see if the pain will go away. Get medical help at once. Call your local emergency services (911 in [place] Do not drive yourself to the hospital. MAKE SURE YOU:   Understand these instructions.  Will watch your condition.  Will get help right away if you are not doing well or get worse. Document Released: 10/01/2004 Document Revised: 12/27/2012 Document Reviewed: 07/28/2007 [facility] Patient Information 2015 [place], [place]. This information is not intended to replace advice given to you by your health care provider. Make sure you discuss any questions you have with your health care provider.

## 2014-09-19 ENCOUNTER — Encounter (HOSPITAL_COMMUNITY): Payer: Self-pay | Admitting: Neurology

## 2014-09-19 ENCOUNTER — Emergency Department (HOSPITAL_COMMUNITY): Payer: Medicaid Other

## 2014-09-19 ENCOUNTER — Emergency Department (HOSPITAL_COMMUNITY)
Admission: EM | Admit: 2014-09-19 | Discharge: 2014-09-20 | Disposition: A | Discharge status: Home / Self Care | Payer: Medicaid Other | Attending: Emergency Medicine | Admitting: Emergency Medicine

## 2014-09-19 DIAGNOSIS — N183 Chronic kidney disease, stage 3 (moderate): Secondary | ICD-10-CM | POA: Diagnosis not present

## 2014-09-19 DIAGNOSIS — I129 Hypertensive chronic kidney disease with stage 1 through stage 4 chronic kidney disease, or unspecified chronic kidney disease: Secondary | ICD-10-CM | POA: Diagnosis not present

## 2014-09-19 DIAGNOSIS — E782 Mixed hyperlipidemia: Secondary | ICD-10-CM | POA: Diagnosis not present

## 2014-09-19 DIAGNOSIS — N83201 Unspecified ovarian cyst, right side: Secondary | ICD-10-CM

## 2014-09-19 DIAGNOSIS — R109 Unspecified abdominal pain: Secondary | ICD-10-CM | POA: Diagnosis present

## 2014-09-19 DIAGNOSIS — N830 Follicular cyst of ovary: Secondary | ICD-10-CM | POA: Diagnosis not present

## 2014-09-19 DIAGNOSIS — E119 Type 2 diabetes mellitus without complications: Secondary | ICD-10-CM | POA: Insufficient documentation

## 2014-09-19 DIAGNOSIS — R112 Nausea with vomiting, unspecified: Secondary | ICD-10-CM

## 2014-09-19 DIAGNOSIS — Z7982 Long term (current) use of aspirin: Secondary | ICD-10-CM | POA: Insufficient documentation

## 2014-09-19 DIAGNOSIS — K219 Gastro-esophageal reflux disease without esophagitis: Secondary | ICD-10-CM | POA: Insufficient documentation

## 2014-09-19 DIAGNOSIS — Z79899 Other long term (current) drug therapy: Secondary | ICD-10-CM | POA: Diagnosis not present

## 2014-09-19 LAB — URINE MICROSCOPIC-ADD ON

## 2014-09-19 LAB — COMPREHENSIVE METABOLIC PANEL
ALBUMIN: 2.1 g/dL — AB (ref 3.5–5.0)
ALT: 12 U/L — AB (ref 14–54)
AST: 17 U/L (ref 15–41)
Alkaline Phosphatase: 36 U/L — ABNORMAL LOW (ref 38–126)
Anion gap: 7 (ref 5–15)
BUN: 44 mg/dL — AB (ref 6–20)
CHLORIDE: 108 mmol/L (ref 101–111)
CO2: 23 mmol/L (ref 22–32)
CREATININE: 2.98 mg/dL — AB (ref 0.44–1.00)
Calcium: 8.1 mg/dL — ABNORMAL LOW (ref 8.9–10.3)
GFR calc Af Amer: 21 mL/min — ABNORMAL LOW (ref >60)
GFR, EST NON AFRICAN AMERICAN: 18 mL/min — AB (ref >60)
GLUCOSE: 106 mg/dL — AB (ref 65–99)
POTASSIUM: 4 mmol/L (ref 3.5–5.1)
SODIUM: 138 mmol/L (ref 135–145)
Total Bilirubin: 0.4 mg/dL (ref 0.3–1.2)
Total Protein: 5.1 g/dL — ABNORMAL LOW (ref 6.5–8.1)

## 2014-09-19 LAB — URINALYSIS, ROUTINE W REFLEX MICROSCOPIC
BILIRUBIN URINE: NEGATIVE
GLUCOSE, UA: 250 mg/dL — AB
KETONES UR: NEGATIVE mg/dL
Leukocytes, UA: NEGATIVE
NITRITE: NEGATIVE
PH: 6.5 (ref 5.0–8.0)
Protein, ur: >300 mg/dL — AB
SPECIFIC GRAVITY, URINE: 1.018 (ref 1.005–1.030)
Urobilinogen, UA: 0.2 mg/dL (ref 0.0–1.0)

## 2014-09-19 LAB — CBC
HEMATOCRIT: 31.2 % — AB (ref 36.0–46.0)
Hemoglobin: 9.7 g/dL — ABNORMAL LOW (ref 12.0–15.0)
MCH: 24.1 pg — AB (ref 26.0–34.0)
MCHC: 31.1 g/dL (ref 30.0–36.0)
MCV: 77.6 fL — AB (ref 78.0–100.0)
PLATELETS: 338 10*3/uL (ref 150–400)
RBC: 4.02 MIL/uL (ref 3.87–5.11)
RDW: 14.3 % (ref 11.5–15.5)
WBC: 4.2 10*3/uL (ref 4.0–10.5)

## 2014-09-19 LAB — LIPASE, BLOOD: LIPASE: 24 U/L (ref 22–51)

## 2014-09-19 MED ORDER — HYDROMORPHONE HCL 1 MG/ML IJ SOLN
0.5000 mg | Freq: Once | INTRAMUSCULAR | Status: AC
  Administered 2014-09-19: 0.5 mg via INTRAVENOUS
  Filled 2014-09-19: qty 1

## 2014-09-19 MED ORDER — METOCLOPRAMIDE HCL 5 MG/ML IJ SOLN
10.0000 mg | Freq: Once | INTRAMUSCULAR | Status: AC
  Administered 2014-09-19: 10 mg via INTRAVENOUS
  Filled 2014-09-19: qty 2

## 2014-09-19 MED ORDER — ONDANSETRON 4 MG PO TBDP
4.0000 mg | ORAL_TABLET | Freq: Once | ORAL | Status: AC | PRN
  Administered 2014-09-19: 4 mg via ORAL

## 2014-09-19 MED ORDER — IOHEXOL 300 MG/ML  SOLN: 25.0000 mL | INTRAMUSCULAR | Status: AC

## 2014-09-19 MED ORDER — ONDANSETRON HCL 4 MG/2ML IJ SOLN
4.0000 mg | Freq: Once | INTRAMUSCULAR | Status: AC
  Administered 2014-09-19: 4 mg via INTRAVENOUS
  Filled 2014-09-19: qty 2

## 2014-09-19 MED ORDER — ONDANSETRON 4 MG PO TBDP
ORAL_TABLET | ORAL | Status: AC
  Filled 2014-09-19: qty 1

## 2014-09-19 NOTE — ED Notes (Signed)
MD at bedside. 

## 2014-09-19 NOTE — ED Notes (Signed)
Pt reports RUQ abd pain since last night into right flank, just started vomiting this afternoon. Feels like a knife is stabbing her in her side. Is a x 4

## 2014-09-19 NOTE — ED Notes (Signed)
Patient transported to Ultrasound 

## 2014-09-19 NOTE — ED Provider Notes (Signed)
CSN: [id number]     Arrival date & time 09/19/14  1546 History   First MD Initiated Contact with Patient 09/19/14 1811     Chief Complaint  Patient presents with  . Abdominal Pain     (Consider location/radiation/quality/duration/timing/severity/associated sxs/prior Treatment) HPI  Patricia [name] is a 39 y.o. female with history of hypertension, acid reflux, diabetes, chronic kidney disease, presents to emergency department complaining of right flank pain, nausea, vomiting. States pain started last night after she ate dinner. She states vomiting started today. She reports pain is sharp, stabbing. She reports similar pains in the past but states he has never been this bad before. She denies any cough, no fever or chills, no shortness of breath or pain with deep breathing. Points history of pleurisy. She reports history of emesis, states she takes Phenergan for it at home. Patient also states she was recently seen for similar pain and was prescribed Robaxin which she has been taking with no relief. She states they told it's probably musculoskeletal.   Past Medical History  Diagnosis Date  . Hypertension   . Acid reflux   . High cholesterol   . History of blood transfusion "a couple"    "related to low counts"  . Headache(784.0)     "related to chemo; sometimes weekly" (09/12/2013)  . Type II diabetes mellitus     "took me off my RX ~ 04/2013"  . Iron deficiency anemia     "get epogen shots q month" (02/20/2014)  . Chronic kidney disease (CKD), stage III (moderate)   . 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 Ankle fracture surgery Right 1994  . Abdominal hysterectomy  2010    "laparoscopic"  . Fracture surgery    . Cardiac catheterization  2000's   Family History  Problem Relation Age of Onset  . Hypertension Mother   . Thyroid disease Mother   . Coronary artery disease  Father   . Hypertension Father   . Diabetes Father    Social History  Substance Use Topics  . Smoking status: Never Smoker   . Smokeless tobacco: Never Used  . Alcohol Use: No   OB History    Gravida Para Term Preterm AB TAB SAB Ectopic Multiple Living   3        3      Review of Systems  Constitutional: Negative for fever and chills.  Respiratory: Negative for cough, chest tightness and shortness of breath.   Cardiovascular: Negative for chest pain, palpitations and leg swelling.  Gastrointestinal: Positive for abdominal pain. Negative for nausea, vomiting and diarrhea.  Genitourinary: Positive for flank pain. Negative for dysuria, urgency, frequency, hematuria and pelvic pain.  Musculoskeletal: Negative for myalgias, arthralgias, neck pain and neck stiffness.  Skin: Negative for rash.  Neurological: Negative for dizziness, weakness and headaches.  All other systems reviewed and are negative.     Allergies  Nsaids  Home Medications   Prior to Admission medications   Medication Sig Start Date End Date Taking? Authorizing Provider  amLODipine (NORVASC) 5 MG tablet Take 5 mg by mouth daily. 08/12/14   Historical Provider, MD  aspirin 81 MG chewable tablet Chew 1 tablet (81 mg total) by mouth daily. 10/03/13   Eugenie Filler, MD  atorvastatin (LIPITOR) 40 MG tablet Take 40 mg by mouth daily. 03/14/14   Historical Provider, MD  calcium acetate (PHOSLO) 667 MG tablet Take 1,334 mg by mouth 3 (three) times daily. 08/02/14 08/02/15  Historical Provider, MD  Cholecalciferol (VITAMIN D-1000 MAX ST) 1000 UNITS tablet Take 1,000 Units by mouth daily. 08/02/14 08/02/15  Historical Provider, MD  corticotropin (ATHACAR H.P.) 80 UNIT/ML injectable gel Inject 1 mL into the muscle 3 (three) times a week. On Tuesday, Thursday and Saturday 08/02/14   Historical Provider, MD  lidocaine (LIDODERM) 5 % Place 1 patch onto the skin daily. Remove & Discard patch within 12 hours or as directed by MD 08/21/14    April Palumbo, MD  lisinopril (PRINIVIL,ZESTRIL) 10 MG tablet Take 10 mg by mouth daily.    Historical Provider, MD  methocarbamol (ROBAXIN) 500 MG tablet Take 1 tablet (500 mg total) by mouth 2 (two) times daily. 08/21/14   April Palumbo, MD  metolazone (ZAROXOLYN) 2.5 MG tablet Take 1 tablet (2.5 mg total) by mouth daily as needed (for leg swellings or shortness of breath). 04/12/14   Nishant Dhungel, MD  metoprolol tartrate (LOPRESSOR) 25 MG tablet Take 1 tablet (25 mg total) by mouth 2 (two) times daily. 10/03/13   Eugenie Filler, MD  Multiple Vitamin (MULTIVITAMIN WITH MINERALS) TABS tablet Take 1 tablet by mouth daily.    Historical Provider, MD  omeprazole (PRILOSEC) 40 MG capsule Take 40 mg by mouth daily.      Historical Provider, MD  potassium chloride (K-DUR) 10 MEQ tablet Take 10 mEq by mouth daily. 08/12/14   Historical Provider, MD  SUMAtriptan (IMITREX) 50 MG tablet Take 50 mg by mouth 2 (two) times daily as needed for migraine. May repeat in 2 hours if headache persists or recurs.    Historical Provider, MD  torsemide (DEMADEX) 20 MG tablet Take 2 tablets (40 mg total) by mouth daily. 04/12/14   Nishant Dhungel, MD  traZODone (DESYREL) 50 MG tablet Take 50 mg by mouth at bedtime.    Historical Provider, MD  vitamin C (ASCORBIC ACID) 500 MG tablet Take 500 mg by mouth daily.    Historical Provider, MD   BP 133/73 mmHg  Pulse 56  Temp(Src) 99.1 F (37.3 C) (Oral)  Resp 16  Ht $R'5\' 7"'tY$  (1.702 m)  Wt 234 lb (106.142 kg)  BMI 36.64 kg/m2  SpO2 100% Physical Exam  Constitutional: She is oriented to person, place, and time. She appears well-developed and well-nourished. No distress.  HENT:  Head: Normocephalic.  Eyes: Conjunctivae are normal.  Neck: Neck supple.  Cardiovascular: Normal rate, regular rhythm and normal heart sounds.   Pulmonary/Chest: Effort normal and breath sounds normal. No respiratory distress. She has no wheezes. She has no rales. She exhibits no tenderness.   Abdominal: Soft. Bowel sounds are normal. She exhibits no distension. There is tenderness. There is no rebound.  Right upper quadrant, right CVA tenderness  Musculoskeletal: She exhibits no edema.  Neurological: She is alert and oriented to person, place, and time.  Skin: Skin is warm and dry.  Psychiatric: She has a normal mood and affect. Her behavior is normal.  Nursing note and vitals reviewed.   ED Course  Procedures (including critical care time) Labs Review Labs Reviewed  COMPREHENSIVE METABOLIC PANEL - Abnormal; Notable for the following:    Glucose, Bld 106 (*)    BUN 44 (*)    Creatinine, Ser 2.98 (*)    Calcium 8.1 (*)    Total Protein 5.1 (*)    Albumin 2.1 (*)    ALT 12 (*)  Alkaline Phosphatase 36 (*)    GFR calc non Af Amer 18 (*)    GFR calc Af Amer 21 (*)    All other components within normal limits  CBC - Abnormal; Notable for the following:    Hemoglobin 9.7 (*)    HCT 31.2 (*)    MCV 77.6 (*)    MCH 24.1 (*)    All other components within normal limits  URINALYSIS, ROUTINE W REFLEX MICROSCOPIC (NOT AT [facility]) - Abnormal; Notable for the following:    Glucose, UA 250 (*)    Hgb urine dipstick SMALL (*)    Protein, ur >300 (*)    All other components within normal limits  URINE MICROSCOPIC-ADD ON - Abnormal; Notable for the following:    Squamous Epithelial / LPF FEW (*)    All other components within normal limits  LIPASE, BLOOD    Imaging Review Ct Abdomen Pelvis Wo Contrast  09/19/2014   CLINICAL DATA:  39 year old female with right upper quadrant abdominal pain  EXAM: CT ABDOMEN AND PELVIS WITHOUT CONTRAST  TECHNIQUE: Multidetector CT imaging of the abdomen and pelvis was performed following the standard protocol without IV contrast.  COMPARISON:  Ultrasound dated 09/19/2014 and CT dated 06/29/2014  FINDINGS: Evaluation of this exam is limited in the absence of intravenous contrast.  The visualized lung bases are clear. No intra-abdominal free air  or free fluid. There is hypoattenuation of the cardiac blood pool indicative of anemia.  The liver, gallbladder, pancreas, spleen, adrenal glands, kidneys, visualized ureters, and urinary bladder appear unremarkable. Hysterectomy. A 3 cm right ovarian dominant follicle/cyst.  Moderate stool throughout the colon. No evidence of bowel obstruction or inflammation. Normal appendix.  The abdominal aorta and IVC appear unremarkable. No portal venous gas identified. There is no lymphadenopathy. Midline anterior abdominal wall incisional scar. Small fat containing umbilical hernia as well as small fat containing supraumbilical hernia noted. There is no associated inflammatory changes or fluid collection.  IMPRESSION: A 3 cm right ovarian follicles/cysts. No other acute intra-abdominal or pelvic pathology identified.   Electronically Signed   By: Anner Crete M.D.   On: 09/19/2014 23:53   US Abdomen Complete  09/19/2014   CLINICAL DATA:  Right flank and right upper quadrant abdominal pain.  EXAM: ULTRASOUND ABDOMEN COMPLETE  COMPARISON:  06/29/2014.  FINDINGS: Gallbladder: No gallstones or wall thickening visualized. No sonographic Murphy sign noted.  Common bile duct: Diameter: 3.3 mm  Liver: Normal echogenicity without focal lesion or biliary dilatation.  IVC: Normal caliber  Pancreas: Sonographically normal  Spleen: Normal size and echogenicity without focal lesions  Right Kidney: Length: 11.5 cm. Normal renal cortical thickness and echogenicity without focal lesions or hydronephrosis.  Left Kidney: Length: 12.8 cm. Normal renal cortical thickness and echogenicity without focal lesions. Mild hydronephrosis.  Abdominal aorta: No aneurysm visualized.  Other findings: None.  IMPRESSION: 1. No gallstones and normal common bile duct. 2. Normal sonographic appearance of the liver, pancreas and spleen. 3. Suspect mild left-sided hydronephrosis.   Electronically Signed   By: Marijo Sanes M.D.   On: 09/19/2014 20:18   I  have personally reviewed and evaluated these images and lab results as part of my medical decision-making.   EKG Interpretation None      MDM   Final diagnoses:  Abdominal pain, unspecified abdominal location  Non-intractable vomiting with nausea, vomiting of unspecified type  Cyst of right ovary   Patient emergency department with right-sided upper abdominal pain, flank pain, nausea, vomiting.  Patient states symptoms started yesterday. Multiple prior visits for similar pain, she states she was told this could be pleurisy or musculoskeletal pain. I will check labs, ultrasound abdomen to rule out cholelithiasis or cholecystitis. Pain medications and Reglan ordered.  Patient's ultrasound is negative. Patient continues to have pain, will get CT abdomen and pelvis. Renal function and hemoglobin are abnormal but at baseline.   Patient feels much better. CT abdomen and pelvis is negative except for right ovarian cyst. She'll need to follow up with OB/GYN for further evaluation of this cyst. I do not think that is what is causing her right-sided flank pain. Her urinalysis is negative. She's no longer vomiting in the emergency department. We'll give Reglan, tramadol, follow with primary care doctor. Vital signs are normal.  Filed Vitals:   09/19/14 2245 09/19/14 2300 09/19/14 2348 09/19/14 2349  BP: 124/57 104/49 119/61   Pulse: 56 56  57  Temp:      TempSrc:      Resp:      Height:      Weight:      SpO2: 100% 100%  100%     Jeannett Senior, PA-C 09/20/14 0034  Nat Christen, MD 09/20/14 1208

## 2014-09-20 MED ORDER — METOCLOPRAMIDE HCL 10 MG PO TABS: 10.0000 mg | ORAL_TABLET | Freq: Four times a day (QID) | ORAL | Status: DC

## 2014-09-20 MED ORDER — TRAMADOL HCL 50 MG PO TABS
50.0000 mg | ORAL_TABLET | Freq: Four times a day (QID) | ORAL | Status: DC | PRN
Start: 2014-09-20 — End: 2015-04-25

## 2014-09-20 NOTE — Discharge Instructions (Signed)
Take Reglan as prescribed as needed for nausea and vomiting. Tramadol for pain. Rest. Clear fluids for 24 hours. Follow with primary care doctor. Return if worsening symptoms.  Flank Pain Flank pain refers to pain that is located on the side of the body between the upper abdomen and the back. The pain may occur over a short period of time (acute) or may be long-term or reoccurring (chronic). It may be mild or severe. Flank pain can be caused by many things. CAUSES  Some of the more common causes of flank pain include:  Muscle strains.   Muscle spasms.   A disease of your spine (vertebral disk disease).   A lung infection (pneumonia).   Fluid around your lungs (pulmonary edema).   A kidney infection.   Kidney stones.   A very painful skin rash caused by the chickenpox virus (shingles).   Gallbladder disease.  [place] care will depend on the cause of your pain. In general,  Rest as directed by your caregiver.  Drink enough fluids to keep your urine clear or pale yellow.  Only take over-the-counter or prescription medicines as directed by your caregiver. Some medicines may help relieve the pain.  Tell your caregiver about any changes in your pain.  Follow up with your caregiver as directed. SEEK IMMEDIATE MEDICAL CARE IF:   Your pain is not controlled with medicine.   You have new or worsening symptoms.  Your pain increases.   You have abdominal pain.   You have shortness of breath.   You have persistent nausea or vomiting.   You have swelling in your abdomen.   You feel faint or pass out.   You have blood in your urine.  You have a fever or persistent symptoms for more than 2-3 days.  You have a fever and your symptoms suddenly get worse. MAKE SURE YOU:   Understand these instructions.  Will watch your condition.  Will get help right away if you are not doing well or get worse. Document Released: 02/12/2005 Document  Revised: 09/16/2011 Document Reviewed: 08/06/2011 [facility] Patient Information 2015 [place], [place]. This information is not intended to replace advice given to you by your health care provider. Make sure you discuss any questions you have with your health care provider.

## 2014-10-18 ENCOUNTER — Emergency Department (HOSPITAL_COMMUNITY): Payer: Medicaid Other

## 2014-10-18 ENCOUNTER — Emergency Department (HOSPITAL_COMMUNITY)
Admission: EM | Admit: 2014-10-18 | Discharge: 2014-10-18 | Disposition: A | Discharge status: Home / Self Care | Payer: Medicaid Other | Attending: Emergency Medicine | Admitting: Emergency Medicine

## 2014-10-18 ENCOUNTER — Encounter (HOSPITAL_COMMUNITY): Payer: Self-pay | Admitting: Emergency Medicine

## 2014-10-18 DIAGNOSIS — K219 Gastro-esophageal reflux disease without esophagitis: Secondary | ICD-10-CM | POA: Insufficient documentation

## 2014-10-18 DIAGNOSIS — Z79899 Other long term (current) drug therapy: Secondary | ICD-10-CM | POA: Insufficient documentation

## 2014-10-18 DIAGNOSIS — R0789 Other chest pain: Secondary | ICD-10-CM | POA: Diagnosis present

## 2014-10-18 DIAGNOSIS — Z7982 Long term (current) use of aspirin: Secondary | ICD-10-CM | POA: Insufficient documentation

## 2014-10-18 DIAGNOSIS — E7800 Pure hypercholesterolemia, unspecified: Secondary | ICD-10-CM | POA: Insufficient documentation

## 2014-10-18 DIAGNOSIS — N183 Chronic kidney disease, stage 3 (moderate): Secondary | ICD-10-CM | POA: Diagnosis not present

## 2014-10-18 DIAGNOSIS — R51 Headache: Secondary | ICD-10-CM | POA: Diagnosis not present

## 2014-10-18 DIAGNOSIS — I129 Hypertensive chronic kidney disease with stage 1 through stage 4 chronic kidney disease, or unspecified chronic kidney disease: Secondary | ICD-10-CM | POA: Insufficient documentation

## 2014-10-18 DIAGNOSIS — E119 Type 2 diabetes mellitus without complications: Secondary | ICD-10-CM | POA: Insufficient documentation

## 2014-10-18 DIAGNOSIS — Z862 Personal history of diseases of the blood and blood-forming organs and certain disorders involving the immune mechanism: Secondary | ICD-10-CM | POA: Insufficient documentation

## 2014-10-18 DIAGNOSIS — R5383 Other fatigue: Secondary | ICD-10-CM | POA: Insufficient documentation

## 2014-10-18 LAB — CBC
HEMATOCRIT: 27.6 % — AB (ref 36.0–46.0)
Hemoglobin: 8.8 g/dL — ABNORMAL LOW (ref 12.0–15.0)
MCH: 24.1 pg — ABNORMAL LOW (ref 26.0–34.0)
MCHC: 31.9 g/dL (ref 30.0–36.0)
MCV: 75.6 fL — ABNORMAL LOW (ref 78.0–100.0)
Platelets: 286 10*3/uL (ref 150–400)
RBC: 3.65 MIL/uL — ABNORMAL LOW (ref 3.87–5.11)
RDW: 14 % (ref 11.5–15.5)
WBC: 4.4 10*3/uL (ref 4.0–10.5)

## 2014-10-18 LAB — BASIC METABOLIC PANEL
Anion gap: 9 (ref 5–15)
BUN: 74 mg/dL — AB (ref 6–20)
CALCIUM: 8.3 mg/dL — AB (ref 8.9–10.3)
CHLORIDE: 105 mmol/L (ref 101–111)
CO2: 22 mmol/L (ref 22–32)
CREATININE: 3.13 mg/dL — AB (ref 0.44–1.00)
GFR calc non Af Amer: 17 mL/min — ABNORMAL LOW (ref >60)
GFR, EST AFRICAN AMERICAN: 20 mL/min — AB (ref >60)
Glucose, Bld: 126 mg/dL — ABNORMAL HIGH (ref 65–99)
Potassium: 3.7 mmol/L (ref 3.5–5.1)
Sodium: 136 mmol/L (ref 135–145)

## 2014-10-18 LAB — I-STAT TROPONIN, ED
Troponin i, poc: 0 ng/mL (ref 0.00–0.08)
Troponin i, poc: 0 ng/mL (ref 0.00–0.08)

## 2014-10-18 LAB — TROPONIN I: Troponin I: <0.03 ng/mL (ref <0.031)

## 2014-10-18 MED ORDER — SODIUM CHLORIDE 0.9 % IV BOLUS (SEPSIS)
500.0000 mL | Freq: Once | INTRAVENOUS | Status: AC
  Administered 2014-10-18: 500 mL via INTRAVENOUS

## 2014-10-18 NOTE — ED Notes (Signed)
Pt reports not feeling well today. States this AM developed chest pain and heaviness while at rest. Pt states she feels lightheaded and weak when she got up to the shower today. Pt reports pain has been constant, radiates to left arm and back.

## 2014-10-18 NOTE — ED Provider Notes (Signed)
CSN: [id number]     Arrival date & time 10/18/14  1505 History   First MD Initiated Contact with Patient 10/18/14 1715     Chief Complaint  Patient presents with  . Chest Pain  . Fatigue     (Consider location/radiation/quality/duration/timing/severity/associated sxs/prior Treatment) Patient is a 39 y.o. female presenting with chest pain. The history is provided by the patient and medical records. No language interpreter was used.  Chest Pain Pain location:  L lateral chest and L chest Pain quality: pressure   Pain radiates to:  L arm Pain radiates to the back: no   Pain severity:  Moderate Onset quality:  Gradual Duration:  1 day Timing:  Constant Progression:  Improving Context: at rest   Context: not breathing, no stress and no trauma   Relieved by:  Nothing Worsened by:  Nothing tried Ineffective treatments:  None tried Associated symptoms: shortness of breath   Associated symptoms: no abdominal pain, no anxiety, no back pain, no cough, no diaphoresis, no dizziness, no fatigue, no fever, no headache, no lower extremity edema, no nausea, no near-syncope, no palpitations, no syncope, not vomiting and no weakness   Shortness of breath:    Severity:  Mild   Onset quality:  Gradual   Progression:  Resolved Risk factors: obesity   Risk factors: no prior DVT/PE     Past Medical History  Diagnosis Date  . Hypertension   . Acid reflux   . High cholesterol   . History of blood transfusion "a couple"    "related to low counts"  . Headache(784.0)     "related to chemo; sometimes weekly" (09/12/2013)  . Type II diabetes mellitus ([place])     "took me off my RX ~ 04/2013"  . Iron deficiency anemia     "get epogen shots q month" (02/20/2014)  . Chronic kidney disease (CKD), stage III (moderate)   . 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 Ankle fracture surgery Right  1994  . Abdominal hysterectomy  2010    "laparoscopic"  . Fracture surgery    . Cardiac catheterization  2000's   Family History  Problem Relation Age of Onset  . Hypertension Mother   . Thyroid disease Mother   . Coronary artery disease Father   . Hypertension Father   . Diabetes Father    Social History  Substance Use Topics  . Smoking status: Never Smoker   . Smokeless tobacco: Never Used  . Alcohol Use: No   OB History    Gravida Para Term Preterm AB TAB SAB Ectopic Multiple Living   3        3      Review of Systems  Constitutional: Negative for fever, chills, diaphoresis, appetite change and fatigue.  HENT: Negative for congestion.   Eyes: Negative for photophobia and visual disturbance.  Respiratory: Positive for shortness of breath. Negative for apnea, cough, choking, chest tightness, wheezing and stridor.   Cardiovascular: Positive for chest pain. Negative for palpitations, syncope and near-syncope.  Gastrointestinal: Negative for nausea, vomiting, abdominal pain and constipation.  Genitourinary: Negative for dysuria, urgency, frequency, flank pain, vaginal pain and pelvic pain.  Musculoskeletal: Negative for back pain, gait problem, neck pain and neck stiffness.  Skin: Negative for rash and wound.  Neurological: Negative for dizziness, seizures, syncope, facial asymmetry, weakness, light-headedness and headaches.  Psychiatric/Behavioral: Negative  for confusion and agitation.  All other systems reviewed and are negative.     Allergies  Nsaids  Home Medications   Prior to Admission medications   Medication Sig Start Date End Date Taking? Authorizing Provider  amLODipine (NORVASC) 5 MG tablet Take 5 mg by mouth daily. 08/12/14   Historical Provider, MD  aspirin 81 MG chewable tablet Chew 1 tablet (81 mg total) by mouth daily. 10/03/13   Eugenie Filler, MD  atorvastatin (LIPITOR) 40 MG tablet Take 40 mg by mouth daily. 03/14/14   Historical Provider, MD   calcium acetate (PHOSLO) 667 MG tablet Take 1,334 mg by mouth 3 (three) times daily. 08/02/14 08/02/15  Historical Provider, MD  Cholecalciferol (VITAMIN D-1000 MAX ST) 1000 UNITS tablet Take 1,000 Units by mouth daily. 08/02/14 08/02/15  Historical Provider, MD  lidocaine (LIDODERM) 5 % Place 1 patch onto the skin daily. Remove & Discard patch within 12 hours or as directed by MD 08/21/14   April Palumbo, MD  lisinopril (PRINIVIL,ZESTRIL) 10 MG tablet Take 10 mg by mouth daily.    Historical Provider, MD  LORazepam (ATIVAN) 0.5 MG tablet Take 0.5 mg by mouth every 8 (eight) hours as needed for anxiety.    Historical Provider, MD  methocarbamol (ROBAXIN) 500 MG tablet Take 1 tablet (500 mg total) by mouth 2 (two) times daily. 08/21/14   April Palumbo, MD  metoCLOPramide (REGLAN) 10 MG tablet Take 1 tablet (10 mg total) by mouth every 6 (six) hours. 09/20/14   Tatyana Kirichenko, PA-C  metolazone (ZAROXOLYN) 2.5 MG tablet Take 1 tablet (2.5 mg total) by mouth daily as needed (for leg swellings or shortness of breath). 04/12/14   Nishant Dhungel, MD  metoprolol tartrate (LOPRESSOR) 25 MG tablet Take 1 tablet (25 mg total) by mouth 2 (two) times daily. 10/03/13   Eugenie Filler, MD  Multiple Vitamin (MULTIVITAMIN WITH MINERALS) TABS tablet Take 1 tablet by mouth daily.    Historical Provider, MD  omeprazole (PRILOSEC) 40 MG capsule Take 40 mg by mouth daily.      Historical Provider, MD  potassium chloride (K-DUR) 10 MEQ tablet Take 10 mEq by mouth daily. 08/12/14   Historical Provider, MD  SUMAtriptan (IMITREX) 50 MG tablet Take 50 mg by mouth 2 (two) times daily as needed for migraine. May repeat in 2 hours if headache persists or recurs.    Historical Provider, MD  torsemide (DEMADEX) 20 MG tablet Take 2 tablets (40 mg total) by mouth daily. 04/12/14   Nishant Dhungel, MD  traMADol (ULTRAM) 50 MG tablet Take 1 tablet (50 mg total) by mouth every 6 (six) hours as needed. 09/20/14   Tatyana Kirichenko, PA-C   traZODone (DESYREL) 50 MG tablet Take 50 mg by mouth at bedtime.    Historical Provider, MD  vitamin C (ASCORBIC ACID) 500 MG tablet Take 500 mg by mouth daily.    Historical Provider, MD   BP 129/90 mmHg  Pulse 71  Temp(Src) 97.8 F (36.6 C) (Oral)  Resp 18  SpO2 100% Physical Exam  Constitutional: She is oriented to person, place, and time. She appears well-developed and well-nourished. No distress.  HENT:  Head: Normocephalic and atraumatic.  Mouth/Throat: No oropharyngeal exudate.  Eyes: Conjunctivae are normal. Pupils are equal, round, and reactive to light. No scleral icterus.  Neck: Normal range of motion.  Cardiovascular: Normal rate, normal heart sounds and intact distal pulses.   No murmur heard. Pulmonary/Chest: Effort normal and breath sounds normal. No stridor. No respiratory  distress. She has no rales. She exhibits tenderness.  Abdominal: There is no tenderness.  Musculoskeletal: She exhibits no edema or tenderness.  Neurological: She is alert and oriented to person, place, and time. No cranial nerve deficit.  Skin: Skin is warm. She is not diaphoretic. No erythema. No pallor.  Nursing note and vitals reviewed.   ED Course  Procedures (including critical care time) Labs Review Labs Reviewed  BASIC METABOLIC PANEL - Abnormal; Notable for the following:    Glucose, Bld 126 (*)    BUN 74 (*)    Creatinine, Ser 3.13 (*)    Calcium 8.3 (*)    GFR calc non Af Amer 17 (*)    GFR calc Af Amer 20 (*)    All other components within normal limits  CBC - Abnormal; Notable for the following:    RBC 3.65 (*)    Hemoglobin 8.8 (*)    HCT 27.6 (*)    MCV 75.6 (*)    MCH 24.1 (*)    All other components within normal limits  TROPONIN I  I-STAT TROPOININ, ED  I-STAT TROPOININ, ED    Imaging Review Dg Chest 2 View  10/18/2014  CLINICAL DATA:  Stage 4 kidney disease, chest pain EXAM: CHEST  2 VIEW COMPARISON:  09/11/2014 FINDINGS: The heart size and mediastinal  contours are within normal limits. Both lungs are clear. Mild degenerative changes mid and lower thoracic spine. IMPRESSION: No active cardiopulmonary disease. Electronically Signed   By: Lahoma Crocker M.D.   On: 10/18/2014 16:16   I have personally reviewed and evaluated these images and lab results as part of my medical decision-making.   EKG Interpretation   Date/Time:  Thursday October 18 2014 15:12:11 EDT Ventricular Rate:  75 PR Interval:  152 QRS Duration: 88 QT Interval:  390 QTC Calculation: 435 R Axis:   39 Text Interpretation:  Normal sinus rhythm Normal ECG No significant change  since last tracing Confirmed by YAO  MD, DAVID ([id number]) on 10/18/2014  6:40:46 PM      MDM   Patricia [name] is a 39 y.o. female with a past medical history significant for morbid obesity, hypertension, diabetes, chronic kidney disease currently in discussions with nephrology for kidney transplant and history of atypical chest pain who presents with chest pain. The patient reports that she developed chest pain this afternoon at home. The patient describes the pain is located in her left chest radiating to her left arm. The patient says it may have been worsened with exertion and is associated with mild shortness of breath. The patient denies pleurisy, cough, fevers, or chills. The patient denies any history of DVT or pulmonary embolism. The patient denies any unilateral leg pain or leg swelling. The patient reports that she chronically feels fatigue secondary to her kidney failure.  On exam, the patient had tenderness to palpation in her left chest. The patient's lungs were clear to auscultation bilaterally and her exam was otherwise unremarkable. The patient did not have any evidence of unilateral leg swelling.  The patient's vital signs did not reveal fever, tachycardia, tachypnea, abnormal blood pressure, and the patient was maintaining her oxygen saturation on room air.  The patient's troponin was  negative 2, her BMP showed similar findings of elevated creatinine, decreased GFR, and anemia. The patient reports that she gets Depo shots for her hemoglobin. The patient's chest x-ray did not show any acute abnormalities. The patient's EKG showed a normal sinus rhythm with no change from prior.  Given the patient's chest wall tenderness, suspect muscular skeletal etiology. The patient's troponins were negative 2, her EKG was reassuring suggesting that her pain is noncardiac. The patient had no physical findings or workup findings suggestive of pneumonia or other lung abnormality. The patient did not have any abnormalities on her vital signs and was perc negative.  The patient was reassured about her negative workup for acute abnormalities or cardiac etiology of her symptoms. The patient reported that she was feeling better after remaining in the emergency department during her workup. The patient was instructed to follow-up with her PCP for further management. The patient did not have any other questions, concerns, or complaints and the patient was discharged in good condition.  This patient was seen with Dr. Darl Householder, emergency medicine attending.    Final diagnoses:  Atypical chest pain       Antony Blackbird, MD 10/19/14 0121  Wandra Arthurs, MD 10/20/14 [phone]

## 2014-10-18 NOTE — Discharge Instructions (Signed)
Nonspecific Chest Pain  °Chest pain can be caused by many different conditions. There is always a chance that your pain could be related to something serious, such as a heart attack or a blood clot in your lungs. Chest pain can also be caused by conditions that are not life-threatening. If you have chest pain, it is very important to follow up with your health care provider. °CAUSES  °Chest pain can be caused by: °· Heartburn. °· Pneumonia or bronchitis. °· Anxiety or stress. °· Inflammation around your heart (pericarditis) or lung (pleuritis or pleurisy). °· A blood clot in your lung. °· A collapsed lung (pneumothorax). It can develop suddenly on its own (spontaneous pneumothorax) or from trauma to the chest. °· Shingles infection (varicella-zoster virus). °· Heart attack. °· Damage to the bones, muscles, and cartilage that make up your chest wall. This can include: °¨ Bruised bones due to injury. °¨ Strained muscles or cartilage due to frequent or repeated coughing or overwork. °¨ Fracture to one or more ribs. °¨ Sore cartilage due to inflammation (costochondritis). °RISK FACTORS  °Risk factors for chest pain may include: °· Activities that increase your risk for trauma or injury to your chest. °· Respiratory infections or conditions that cause frequent coughing. °· Medical conditions or overeating that can cause heartburn. °· Heart disease or family history of heart disease. °· Conditions or health behaviors that increase your risk of developing a blood clot. °· Having had chicken pox (varicella zoster). °SIGNS AND SYMPTOMS °Chest pain can feel like: °· Burning or tingling on the surface of your chest or deep in your chest. °· Crushing, pressure, aching, or squeezing pain. °· Dull or sharp pain that is worse when you move, cough, or take a deep breath. °· Pain that is also felt in your back, neck, shoulder, or arm, or pain that spreads to any of these areas. °Your chest pain may come and go, or it may stay  constant. °DIAGNOSIS °Lab tests or other studies may be needed to find the cause of your pain. Your health care provider may have you take a test called an ambulatory ECG (electrocardiogram). An ECG records your heartbeat patterns at the time the test is performed. You may also have other tests, such as: °· Transthoracic echocardiogram (TTE). During echocardiography, sound waves are used to create a picture of all of the heart structures and to look at how blood flows through your heart. °· Transesophageal echocardiogram (TEE). This is a more advanced imaging test that obtains images from inside your body. It allows your health care provider to see your heart in finer detail. °· Cardiac monitoring. This allows your health care provider to monitor your heart rate and rhythm in real time. °· Holter monitor. This is a portable device that records your heartbeat and can help to diagnose abnormal heartbeats. It allows your health care provider to track your heart activity for several days, if needed. °· Stress tests. These can be done through exercise or by taking medicine that makes your heart beat more quickly. °· Blood tests. °· Imaging tests. °TREATMENT  °Your treatment depends on what is causing your chest pain. Treatment may include: °· Medicines. These may include: °¨ Acid blockers for heartburn. °¨ Anti-inflammatory medicine. °¨ Pain medicine for inflammatory conditions. °¨ Antibiotic medicine, if an infection is present. °¨ Medicines to dissolve blood clots. °¨ Medicines to treat coronary artery disease. °· Supportive care for conditions that do not require medicines. This may include: °¨ Resting. °¨ Applying heat   or cold packs to injured areas. °¨ Limiting activities until pain decreases. °HOME CARE INSTRUCTIONS °· If you were prescribed an antibiotic medicine, finish it all even if you start to feel better. °· Avoid any activities that bring on chest pain. °· Do not use any tobacco products, including  cigarettes, chewing tobacco, or electronic cigarettes. If you need help quitting, ask your health care provider. °· Do not drink alcohol. °· Take medicines only as directed by your health care provider. °· Keep all follow-up visits as directed by your health care provider. This is important. This includes any further testing if your chest pain does not go away. °· If heartburn is the cause for your chest pain, you may be told to keep your head raised (elevated) while sleeping. This reduces the chance that acid will go from your stomach into your esophagus. °· Make lifestyle changes as directed by your health care provider. These may include: °¨ Getting regular exercise. Ask your health care provider to suggest some activities that are safe for you. °¨ Eating a heart-healthy diet. A registered dietitian can help you to learn healthy eating options. °¨ Maintaining a healthy weight. °¨ Managing diabetes, if necessary. °¨ Reducing stress. °SEEK MEDICAL CARE IF: °· Your chest pain does not go away after treatment. °· You have a rash with blisters on your chest. °· You have a fever. °SEEK IMMEDIATE MEDICAL CARE IF:  °· Your chest pain is worse. °· You have an increasing cough, or you cough up blood. °· You have severe abdominal pain. °· You have severe weakness. °· You faint. °· You have chills. °· You have sudden, unexplained chest discomfort. °· You have sudden, unexplained discomfort in your arms, back, neck, or jaw. °· You have shortness of breath at any time. °· You suddenly start to sweat, or your skin gets clammy. °· You feel nauseous or you vomit. °· You suddenly feel light-headed or dizzy. °· Your heart begins to beat quickly, or it feels like it is skipping beats. °These symptoms may represent a serious problem that is an emergency. Do not wait to see if the symptoms will go away. Get medical help right away. Call your local emergency services (911 in the [place] Do not drive yourself to the hospital. °  °This  information is not intended to replace advice given to you by your health care provider. Make sure you discuss any questions you have with your health care provider. °  °Document Released: 10/01/2004 Document Revised: 01/12/2014 Document Reviewed: 07/28/2013 °Elsevier Interactive Patient Education ©2016 [employer] ° °

## 2014-10-18 NOTE — ED Notes (Signed)
Pt ambulated to bathroom without assistance but was unable to provide a urine sample at this time.

## 2014-10-18 NOTE — ED Notes (Signed)
Dr. Darl Householder at bedside.

## 2014-10-29 ENCOUNTER — Encounter (HOSPITAL_COMMUNITY): Payer: Self-pay | Admitting: Cardiology

## 2014-10-29 ENCOUNTER — Emergency Department (HOSPITAL_COMMUNITY)
Admission: EM | Admit: 2014-10-29 | Discharge: 2014-10-29 | Discharge status: Against Medical Advice | Payer: Medicaid Other | Attending: Emergency Medicine | Admitting: Emergency Medicine

## 2014-10-29 DIAGNOSIS — M25569 Pain in unspecified knee: Secondary | ICD-10-CM

## 2014-10-29 DIAGNOSIS — M25562 Pain in left knee: Secondary | ICD-10-CM | POA: Diagnosis present

## 2014-10-29 DIAGNOSIS — E119 Type 2 diabetes mellitus without complications: Secondary | ICD-10-CM | POA: Insufficient documentation

## 2014-10-29 DIAGNOSIS — N183 Chronic kidney disease, stage 3 (moderate): Secondary | ICD-10-CM | POA: Diagnosis not present

## 2014-10-29 DIAGNOSIS — I129 Hypertensive chronic kidney disease with stage 1 through stage 4 chronic kidney disease, or unspecified chronic kidney disease: Secondary | ICD-10-CM | POA: Diagnosis not present

## 2014-10-29 NOTE — ED Notes (Signed)
Reports left knee pain and swelling that started on Saturday. Reports she feels like she is retaining fluid because she has a kidney disease.

## 2014-10-29 NOTE — ED Notes (Signed)
Called no answer

## 2014-11-02 NOTE — ED Provider Notes (Signed)
Patient complained to the nurse about knee swelling.  She was not seen by a provider in left prior to being evaluated by myself.  Leonard Schwartz, MD 11/02/14 [phone]

## 2014-11-10 ENCOUNTER — Encounter (HOSPITAL_COMMUNITY): Payer: Self-pay | Admitting: *Deleted

## 2014-11-10 ENCOUNTER — Emergency Department (HOSPITAL_COMMUNITY): Payer: Medicaid Other

## 2014-11-10 ENCOUNTER — Emergency Department (HOSPITAL_COMMUNITY)
Admission: EM | Admit: 2014-11-10 | Discharge: 2014-11-10 | Disposition: A | Discharge status: Home / Self Care | Payer: Medicaid Other | Attending: Emergency Medicine | Admitting: Emergency Medicine

## 2014-11-10 DIAGNOSIS — Z7982 Long term (current) use of aspirin: Secondary | ICD-10-CM | POA: Insufficient documentation

## 2014-11-10 DIAGNOSIS — I129 Hypertensive chronic kidney disease with stage 1 through stage 4 chronic kidney disease, or unspecified chronic kidney disease: Secondary | ICD-10-CM | POA: Diagnosis not present

## 2014-11-10 DIAGNOSIS — K219 Gastro-esophageal reflux disease without esophagitis: Secondary | ICD-10-CM | POA: Diagnosis not present

## 2014-11-10 DIAGNOSIS — E119 Type 2 diabetes mellitus without complications: Secondary | ICD-10-CM | POA: Diagnosis not present

## 2014-11-10 DIAGNOSIS — Z862 Personal history of diseases of the blood and blood-forming organs and certain disorders involving the immune mechanism: Secondary | ICD-10-CM | POA: Diagnosis not present

## 2014-11-10 DIAGNOSIS — Z79899 Other long term (current) drug therapy: Secondary | ICD-10-CM | POA: Insufficient documentation

## 2014-11-10 DIAGNOSIS — R252 Cramp and spasm: Secondary | ICD-10-CM | POA: Diagnosis not present

## 2014-11-10 DIAGNOSIS — R079 Chest pain, unspecified: Secondary | ICD-10-CM | POA: Diagnosis not present

## 2014-11-10 DIAGNOSIS — E782 Mixed hyperlipidemia: Secondary | ICD-10-CM | POA: Diagnosis not present

## 2014-11-10 DIAGNOSIS — N183 Chronic kidney disease, stage 3 (moderate): Secondary | ICD-10-CM | POA: Insufficient documentation

## 2014-11-10 DIAGNOSIS — R002 Palpitations: Secondary | ICD-10-CM | POA: Diagnosis present

## 2014-11-10 LAB — BASIC METABOLIC PANEL
ANION GAP: 10 (ref 5–15)
BUN: 69 mg/dL — ABNORMAL HIGH (ref 6–20)
CHLORIDE: 103 mmol/L (ref 101–111)
CO2: 23 mmol/L (ref 22–32)
Calcium: 7.6 mg/dL — ABNORMAL LOW (ref 8.9–10.3)
Creatinine, Ser: 2.53 mg/dL — ABNORMAL HIGH (ref 0.44–1.00)
GFR calc Af Amer: 26 mL/min — ABNORMAL LOW (ref >60)
GFR calc non Af Amer: 22 mL/min — ABNORMAL LOW (ref >60)
GLUCOSE: 102 mg/dL — AB (ref 65–99)
POTASSIUM: 3.9 mmol/L (ref 3.5–5.1)
Sodium: 136 mmol/L (ref 135–145)

## 2014-11-10 LAB — MAGNESIUM: Magnesium: 1.4 mg/dL — ABNORMAL LOW (ref 1.7–2.4)

## 2014-11-10 LAB — CBC
HEMATOCRIT: 28.7 % — AB (ref 36.0–46.0)
HEMOGLOBIN: 9.2 g/dL — AB (ref 12.0–15.0)
MCH: 24.7 pg — AB (ref 26.0–34.0)
MCHC: 32.1 g/dL (ref 30.0–36.0)
MCV: 76.9 fL — ABNORMAL LOW (ref 78.0–100.0)
Platelets: 380 10*3/uL (ref 150–400)
RBC: 3.73 MIL/uL — AB (ref 3.87–5.11)
RDW: 15.9 % — ABNORMAL HIGH (ref 11.5–15.5)
WBC: 6 10*3/uL (ref 4.0–10.5)

## 2014-11-10 LAB — I-STAT TROPONIN, ED: Troponin i, poc: 0 ng/mL (ref 0.00–0.08)

## 2014-11-10 LAB — CK: CK TOTAL: 92 U/L (ref 38–234)

## 2014-11-10 MED ORDER — MAGNESIUM SULFATE 4 GM/100ML IV SOLN
4.0000 g | Freq: Once | INTRAVENOUS | Status: AC
  Administered 2014-11-10: 4 g via INTRAVENOUS
  Filled 2014-11-10: qty 100

## 2014-11-10 MED ORDER — DIAZEPAM 5 MG/ML IJ SOLN
2.5000 mg | Freq: Once | INTRAMUSCULAR | Status: AC
  Administered 2014-11-10: 2.5 mg via INTRAVENOUS
  Filled 2014-11-10: qty 2

## 2014-11-10 NOTE — ED Provider Notes (Signed)
Patient seen/examined in the Emergency Department in conjunction with Resident Physician Provider  Patient reports myalgias and palpitations Exam : awake/alert, no distress Plan: hypomagnesemia noted, will replenish and likely d/c home    Ripley Fraise, MD 11/10/14 2124

## 2014-11-10 NOTE — ED Notes (Signed)
Pt reports cramping to entire body and having palpitations and sob. Hx of renal failure.

## 2014-11-10 NOTE — ED Notes (Signed)
Pt reports that her chest is feeling heavy, states it doesn't feel painful - just heavy. Resident to be notified by RN

## 2014-11-10 NOTE — ED Provider Notes (Signed)
CSN: [id number]     Arrival date & time 11/10/14  1806 History   First MD Initiated Contact with Patient 11/10/14 1908     Chief Complaint  Patient presents with  . Palpitations  . Pain     (Consider location/radiation/quality/duration/timing/severity/associated sxs/prior Treatment) Patient is a 39 y.o. female presenting with palpitations. The history is provided by the patient.  Palpitations Palpitations quality:  Unable to specify Onset quality:  With exertion Timing:  Sporadic Progression:  Waxing and waning Chronicity:  Recurrent Context: dehydration   Context: not anxiety, not appetite suppressants, not bronchodilators, not caffeine and not stimulant use   Relieved by:  None tried Worsened by:  Nothing Ineffective treatments:  Bed rest Associated symptoms: chest pressure   Associated symptoms: no back pain, no chest pain, no cough, no diaphoresis, no dizziness, no leg pain, no lower extremity edema, no malaise/fatigue, no nausea, no near-syncope, no numbness, no orthopnea, no PND, no shortness of breath, no syncope, no vomiting and no weakness   Associated symptoms comment:  Muscle cramps  Risk factors: diabetes mellitus   Risk factors: no hx of DVT, no hx of PE, no hx of thyroid disease, no hypercoagulable state and no stress   Risk factors comment:  Renal disease   Past Medical History  Diagnosis Date  . Hypertension   . Acid reflux   . High cholesterol   . History of blood transfusion "a couple"    "related to low counts"  . Headache(784.0)     "related to chemo; sometimes weekly" (09/12/2013)  . Type II diabetes mellitus (HCC)     "took me off my RX ~ 04/2013"  . Iron deficiency anemia     "get epogen shots q month" (02/20/2014)  . Chronic kidney disease (CKD), stage III (moderate)   . 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 Ankle fracture surgery  Right 1994  . Abdominal hysterectomy  2010    "laparoscopic"  . Fracture surgery    . Cardiac catheterization  2000's   Family History  Problem Relation Age of Onset  . Hypertension Mother   . Thyroid disease Mother   . Coronary artery disease Father   . Hypertension Father   . Diabetes Father    Social History  Substance Use Topics  . Smoking status: Never Smoker   . Smokeless tobacco: Never Used  . Alcohol Use: No   OB History    Gravida Para Term Preterm AB TAB SAB Ectopic Multiple Living   3        3      Review of Systems  Constitutional: Negative for fever, malaise/fatigue and diaphoresis.  HENT: Negative for facial swelling.   Respiratory: Positive for chest tightness. Negative for cough and shortness of breath.   Cardiovascular: Positive for palpitations. Negative for chest pain, orthopnea, leg swelling, syncope, PND and near-syncope.  Gastrointestinal: Negative for nausea, vomiting and abdominal pain.  Endocrine: Negative for polyuria.  Genitourinary: Negative for dysuria.  Musculoskeletal: Positive for myalgias. Negative for back pain, arthralgias and gait problem.  Skin: Negative for rash.  Neurological: Negative for dizziness, weakness, numbness and headaches.  Psychiatric/Behavioral: Negative for confusion.      Allergies  Nsaids  Home Medications   Prior to Admission medications   Medication Sig Start Date End Date Taking? Authorizing Provider  amLODipine (NORVASC) 5 MG tablet Take  5 mg by mouth daily. 08/12/14  Yes Historical Provider, MD  aspirin 81 MG chewable tablet Chew 1 tablet (81 mg total) by mouth daily. 10/03/13  Yes Eugenie Filler, MD  atorvastatin (LIPITOR) 40 MG tablet Take 40 mg by mouth daily. 03/14/14  Yes Historical Provider, MD  calcium acetate (PHOSLO) 667 MG tablet Take 1,334 mg by mouth 3 (three) times daily. 08/02/14 08/02/15 Yes Historical Provider, MD  Cholecalciferol (VITAMIN D-1000 MAX ST) 1000 UNITS tablet Take 1,000 Units by  mouth daily. 08/02/14 08/02/15 Yes Historical Provider, MD  colchicine 0.6 MG tablet Take 0.6 mg by mouth every other day. 10/30/14 10/30/15 Yes Historical Provider, MD  diphenhydramine-acetaminophen (TYLENOL PM) 25-500 MG TABS tablet Take 2 tablets by mouth at bedtime as needed.   Yes Historical Provider, MD  lisinopril (PRINIVIL,ZESTRIL) 10 MG tablet Take 10 mg by mouth daily.   Yes Historical Provider, MD  metolazone (ZAROXOLYN) 2.5 MG tablet Take 1 tablet (2.5 mg total) by mouth daily as needed (for leg swellings or shortness of breath). 04/12/14  Yes Nishant Dhungel, MD  metoprolol tartrate (LOPRESSOR) 25 MG tablet Take 1 tablet (25 mg total) by mouth 2 (two) times daily. 10/03/13  Yes Eugenie Filler, MD  Multiple Vitamin (MULTIVITAMIN WITH MINERALS) TABS tablet Take 1 tablet by mouth daily.   Yes Historical Provider, MD  omeprazole (PRILOSEC) 40 MG capsule Take 40 mg by mouth daily.     Yes Historical Provider, MD  potassium chloride (K-DUR) 10 MEQ tablet Take 10 mEq by mouth daily. 08/12/14  Yes Historical Provider, MD  SUMAtriptan (IMITREX) 50 MG tablet Take 50 mg by mouth 2 (two) times daily as needed for migraine. May repeat in 2 hours if headache persists or recurs.   Yes Historical Provider, MD  torsemide (DEMADEX) 20 MG tablet Take 2 tablets (40 mg total) by mouth daily. Patient taking differently: Take 80 mg by mouth daily.  04/12/14  Yes Nishant Dhungel, MD  traZODone (DESYREL) 50 MG tablet Take 50 mg by mouth at bedtime as needed for sleep.    Yes Historical Provider, MD  lidocaine (LIDODERM) 5 % Place 1 patch onto the skin daily. Remove & Discard patch within 12 hours or as directed by MD 08/21/14   April Palumbo, MD  methocarbamol (ROBAXIN) 500 MG tablet Take 1 tablet (500 mg total) by mouth 2 (two) times daily. 08/21/14   April Palumbo, MD  metoCLOPramide (REGLAN) 10 MG tablet Take 1 tablet (10 mg total) by mouth every 6 (six) hours. 09/20/14   Tatyana Kirichenko, PA-C  traMADol (ULTRAM) 50  MG tablet Take 1 tablet (50 mg total) by mouth every 6 (six) hours as needed. 09/20/14   Tatyana Kirichenko, PA-C   BP 126/74 mmHg  Pulse 81  Temp(Src) 98.2 F (36.8 C) (Oral)  Resp 18  Ht $R'5\' 7"'NM$  (1.702 m)  Wt 236 lb (107.049 kg)  BMI 36.95 kg/m2  SpO2 98% Physical Exam  Constitutional: She is oriented to person, place, and time. She appears well-developed and well-nourished. No distress.  HENT:  Head: Normocephalic and atraumatic.  Right Ear: External ear normal.  Left Ear: External ear normal.  Nose: Nose normal.  Mouth/Throat: Oropharynx is clear and moist. No oropharyngeal exudate.  Eyes: Conjunctivae and EOM are normal. Pupils are equal, round, and reactive to light. Right eye exhibits no discharge. Left eye exhibits no discharge. No scleral icterus.  Neck: Normal range of motion. Neck supple. No JVD present. No tracheal deviation present. No thyromegaly present.  Cardiovascular: Normal rate, regular rhythm and intact distal pulses.   Pulmonary/Chest: Effort normal and breath sounds normal. No stridor. No respiratory distress. She has no wheezes. She has no rales. She exhibits no tenderness.  Abdominal: Soft. She exhibits no distension. There is no tenderness.  Musculoskeletal: Normal range of motion. She exhibits no edema or tenderness.  Lymphadenopathy:    She has no cervical adenopathy.  Neurological: She is alert and oriented to person, place, and time.  Skin: Skin is warm and dry. No rash noted. She is not diaphoretic. No erythema. No pallor.  Psychiatric: She has a normal mood and affect. Her behavior is normal. Judgment and thought content normal.  Nursing note and vitals reviewed.   ED Course  Procedures (including critical care time) Labs Review Labs Reviewed  BASIC METABOLIC PANEL - Abnormal; Notable for the following:    Glucose, Bld 102 (*)    BUN 69 (*)    Creatinine, Ser 2.53 (*)    Calcium 7.6 (*)    GFR calc non Af Amer 22 (*)    GFR calc Af Amer 26 (*)     All other components within normal limits  CBC - Abnormal; Notable for the following:    RBC 3.73 (*)    Hemoglobin 9.2 (*)    HCT 28.7 (*)    MCV 76.9 (*)    MCH 24.7 (*)    RDW 15.9 (*)    All other components within normal limits  MAGNESIUM - Abnormal; Notable for the following:    Magnesium 1.4 (*)    All other components within normal limits  CK  I-STAT TROPOININ, ED    Imaging Review Dg Chest 2 View  11/10/2014  CLINICAL DATA:  Pt reports feeling palpitations and body cramping since this AM; h/o HTN and kidney disease per pt; non-smoker EXAM: CHEST  2 VIEW COMPARISON:  10/18/2014 FINDINGS: The heart size and mediastinal contours are within normal limits. Both lungs are clear. No pleural effusion or pneumothorax. The visualized skeletal structures are unremarkable. IMPRESSION: No active cardiopulmonary disease. Electronically Signed   By: Lajean Manes M.D.   On: 11/10/2014 18:46   I have personally reviewed and evaluated these images and lab results as part of my medical decision-making.   EKG Interpretation   Date/Time:  Saturday November 10 2014 23:32:04 EDT Ventricular Rate:  75 PR Interval:  137 QRS Duration: 103 QT Interval:  417 QTC Calculation: 466 R Axis:   71 Text Interpretation:  Sinus rhythm ED PHYSICIAN INTERPRETATION AVAILABLE  IN [facility] [place] Confirmed by TEST, Record ([id number]) on 11/11/2014 9:53:05  AM      MDM   Final diagnoses:  Hypomagnesemia  Muscle cramps  Palpitations  Chest pain, unspecified chest pain type    Pt endorsing palpitations with hx of CKD.  Mild chest pressure with "flip-flop" sensation intermittently.  Worsened with dehydration.  Intermittent muscle cramps.  Similar symptoms in the past with low K.  On diuretics for CKD and K replacement.    W/U studies reassuring.  Magnesium is low.  Likely contributing to symptoms.  Pt symptoms correlate with PVCs on monitor.  Paired PVCs on tele at 2030 with patient reporting sensation of  same palpitations simultaneously.  Symptoms improved with magnesium repletion.  CK WNL.  Troponin WNL.  Patient was given return precautions for chest pain, hypomagnesemia, muscle cramps, and palpitations.  Pt advised on use of medications as applicable.  Advised to return for actely worsening symptoms, inability to take  medications, or other acute concerns.  Advised to follow up with PCP in 2-3 days.  Patient was in agreement with and expressed understanding of follow plan, plan of care, and return precautions.  All questions answered prior to discharge.  Patient was discharged in stable condition, ambulating without difficulty.  Patient care was discussed with my attending, Dr. Christy Gentles.    Hoyle Sauer, MD 11/12/14 2426  Ripley Fraise, MD 11/12/14 [phone]

## 2014-11-21 ENCOUNTER — Encounter (HOSPITAL_COMMUNITY): Payer: Self-pay | Admitting: Emergency Medicine

## 2014-11-21 ENCOUNTER — Emergency Department (HOSPITAL_COMMUNITY)
Admission: EM | Admit: 2014-11-21 | Discharge: 2014-11-22 | Disposition: A | Discharge status: Home / Self Care | Payer: Medicaid Other | Attending: Emergency Medicine | Admitting: Emergency Medicine

## 2014-11-21 DIAGNOSIS — Z7982 Long term (current) use of aspirin: Secondary | ICD-10-CM | POA: Diagnosis not present

## 2014-11-21 DIAGNOSIS — Z79899 Other long term (current) drug therapy: Secondary | ICD-10-CM | POA: Insufficient documentation

## 2014-11-21 DIAGNOSIS — R0789 Other chest pain: Secondary | ICD-10-CM | POA: Insufficient documentation

## 2014-11-21 DIAGNOSIS — I10 Essential (primary) hypertension: Secondary | ICD-10-CM | POA: Insufficient documentation

## 2014-11-21 DIAGNOSIS — E119 Type 2 diabetes mellitus without complications: Secondary | ICD-10-CM | POA: Diagnosis not present

## 2014-11-21 DIAGNOSIS — E782 Mixed hyperlipidemia: Secondary | ICD-10-CM | POA: Diagnosis not present

## 2014-11-21 DIAGNOSIS — Z862 Personal history of diseases of the blood and blood-forming organs and certain disorders involving the immune mechanism: Secondary | ICD-10-CM | POA: Insufficient documentation

## 2014-11-21 DIAGNOSIS — R079 Chest pain, unspecified: Secondary | ICD-10-CM | POA: Diagnosis present

## 2014-11-21 NOTE — ED Notes (Signed)
PER pt Chest pain started approx 30 min. Heaviness in the center of her chest with no raidiation. 8/10.   Per [facility] 3 NTG stated that it did help. "tytanic  To elephant to medium sized dog" sitting on her chest. Pt unable to take ASA chronic stage 4 kidney disease. Pt laying in bed resting when the chest pain started.

## 2014-11-21 NOTE — ED Provider Notes (Signed)
CSN: [id number]     Arrival date & time 11/21/14  2336 History  By signing my name below, I, Hansel Feinstein, attest that this documentation has been prepared under the direction and in the presence of Everlene Balls, MD. Electronically Signed: Hansel Feinstein, ED Scribe. 11/21/2014. 11:51 PM.    Chief Complaint  Patient presents with  . Chest Pain   The history is provided by the patient. No language interpreter was used.    HPI Comments: Patricia [name] is a 39 y.o. female who presents to the Emergency Department complaining of moderate, sudden onset, non-radiating, right-sided chest heaviness and pressure onset 30 minutes ago while at rest with associated SOB and nausea. Pt also notes that she was dizzy today. She states that pressure is worsened with deep breathing. H/o similar symptoms, but not as severe. Chest heaviness and SOB was moderately relieved with 3 NTG taken PTA. No h/o PE/DVT. No recent surgeries, hormone use, long travel. She denies emesis, diaphoresis.    Past Medical History  Diagnosis Date  . Hypertension   . Acid reflux   . High cholesterol   . History of blood transfusion "a couple"    "related to low counts"  . Headache(784.0)     "related to chemo; sometimes weekly" (09/12/2013)  . Type II diabetes mellitus ([place])     "took me off my RX ~ 04/2013"  . Iron deficiency anemia     "get epogen shots q month" (02/20/2014)  . Chronic kidney disease (CKD), stage III (moderate)   . 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 Ankle fracture surgery Right 1994  . Abdominal hysterectomy  2010    "laparoscopic"  . Fracture surgery    . Cardiac catheterization  2000's   Family History  Problem Relation Age of Onset  . Hypertension Mother   . Thyroid disease Mother   . Coronary artery disease Father   . Hypertension Father   . Diabetes Father    Social History   Substance Use Topics  . Smoking status: Never Smoker   . Smokeless tobacco: Never Used  . Alcohol Use: No   OB History    Gravida Para Term Preterm AB TAB SAB Ectopic Multiple Living   3        3      Review of Systems 10 Systems reviewed and are negative for acute change except as noted in the HPI.   Allergies  Nsaids  Home Medications   Prior to Admission medications   Medication Sig Start Date End Date Taking? Authorizing Provider  amLODipine (NORVASC) 5 MG tablet Take 5 mg by mouth daily. 08/12/14   Historical Provider, MD  aspirin 81 MG chewable tablet Chew 1 tablet (81 mg total) by mouth daily. 10/03/13   Eugenie Filler, MD  atorvastatin (LIPITOR) 40 MG tablet Take 40 mg by mouth daily. 03/14/14   Historical Provider, MD  calcium acetate (PHOSLO) 667 MG tablet Take 1,334 mg by mouth 3 (three) times daily. 08/02/14 08/02/15  Historical Provider, MD  Cholecalciferol (VITAMIN D-1000 MAX ST) 1000 UNITS tablet Take 1,000 Units by mouth daily. 08/02/14 08/02/15  Historical Provider, MD  colchicine 0.6 MG tablet Take 0.6 mg by mouth every other day. 10/30/14 10/30/15  Historical Provider, MD  diphenhydramine-acetaminophen (TYLENOL PM) 25-500 MG TABS tablet Take 2 tablets by mouth at bedtime as needed.  Historical Provider, MD  lidocaine (LIDODERM) 5 % Place 1 patch onto the skin daily. Remove & Discard patch within 12 hours or as directed by MD 08/21/14   April Palumbo, MD  lisinopril (PRINIVIL,ZESTRIL) 10 MG tablet Take 10 mg by mouth daily.    Historical Provider, MD  methocarbamol (ROBAXIN) 500 MG tablet Take 1 tablet (500 mg total) by mouth 2 (two) times daily. 08/21/14   April Palumbo, MD  metoCLOPramide (REGLAN) 10 MG tablet Take 1 tablet (10 mg total) by mouth every 6 (six) hours. 09/20/14   Tatyana Kirichenko, PA-C  metolazone (ZAROXOLYN) 2.5 MG tablet Take 1 tablet (2.5 mg total) by mouth daily as needed (for leg swellings or shortness of breath). 04/12/14   Nishant Dhungel, MD   metoprolol tartrate (LOPRESSOR) 25 MG tablet Take 1 tablet (25 mg total) by mouth 2 (two) times daily. 10/03/13   Eugenie Filler, MD  Multiple Vitamin (MULTIVITAMIN WITH MINERALS) TABS tablet Take 1 tablet by mouth daily.    Historical Provider, MD  omeprazole (PRILOSEC) 40 MG capsule Take 40 mg by mouth daily.      Historical Provider, MD  potassium chloride (K-DUR) 10 MEQ tablet Take 10 mEq by mouth daily. 08/12/14   Historical Provider, MD  SUMAtriptan (IMITREX) 50 MG tablet Take 50 mg by mouth 2 (two) times daily as needed for migraine. May repeat in 2 hours if headache persists or recurs.    Historical Provider, MD  torsemide (DEMADEX) 20 MG tablet Take 2 tablets (40 mg total) by mouth daily. Patient taking differently: Take 80 mg by mouth daily.  04/12/14   Nishant Dhungel, MD  traMADol (ULTRAM) 50 MG tablet Take 1 tablet (50 mg total) by mouth every 6 (six) hours as needed. 09/20/14   Tatyana Kirichenko, PA-C  traZODone (DESYREL) 50 MG tablet Take 50 mg by mouth at bedtime as needed for sleep.     Historical Provider, MD   BP 138/85 mmHg  Pulse 91  Resp 22  Ht $R'5\' 7"'Ze$  (1.702 m)  Wt 233 lb (105.688 kg)  BMI 36.48 kg/m2  SpO2 100% Physical Exam  Constitutional: She is oriented to person, place, and time. She appears well-developed and well-nourished. No distress.  HENT:  Head: Normocephalic and atraumatic.  Nose: Nose normal.  Mouth/Throat: Oropharynx is clear and moist. No oropharyngeal exudate.  Eyes: Conjunctivae and EOM are normal. Pupils are equal, round, and reactive to light. No scleral icterus.  Neck: Normal range of motion. Neck supple. No JVD present. No tracheal deviation present. No thyromegaly present.  Cardiovascular: Normal rate, regular rhythm and normal heart sounds.  Exam reveals no gallop and no friction rub.   No murmur heard. Pulmonary/Chest: Effort normal and breath sounds normal. No respiratory distress. She has no wheezes. She exhibits no tenderness.  Abdominal:  Soft. Bowel sounds are normal. She exhibits no distension and no mass. There is no tenderness. There is no rebound and no guarding.  Musculoskeletal: Normal range of motion. She exhibits no edema or tenderness.  Lymphadenopathy:    She has no cervical adenopathy.  Neurological: She is alert and oriented to person, place, and time. No cranial nerve deficit. She exhibits normal muscle tone.  Skin: Skin is warm and dry. No rash noted. No erythema. No pallor.  Nursing note and vitals reviewed.   ED Course  Procedures (including critical care time) DIAGNOSTIC STUDIES: Oxygen Saturation is 100% on RA, normal by my interpretation.    COORDINATION OF CARE: 11:49 PM Discussed treatment  plan with pt at bedside and pt agreed to plan.   Labs Review Labs Reviewed  CBC WITH DIFFERENTIAL/PLATELET  BASIC METABOLIC PANEL  MAGNESIUM  I-STAT TROPOININ, ED    Imaging Review Dg Chest 2 View  11/22/2014  CLINICAL DATA:  Acute onset of nausea, vomiting and chest heaviness. Initial encounter. EXAM: CHEST  2 VIEW COMPARISON:  Chest radiograph performed 11/10/2014 FINDINGS: The lungs are well-aerated and clear. There is no evidence of focal opacification, pleural effusion or pneumothorax. The heart is normal in size; the mediastinal contour is within normal limits. No acute osseous abnormalities are seen. IMPRESSION: No acute cardiopulmonary process seen. Electronically Signed   By: Garald Balding M.D.   On: 11/22/2014 00:27   I have personally reviewed and evaluated these images and lab results as part of my medical decision-making.   EKG Interpretation   Date/Time:  Wednesday November 21 2014 23:50:01 EST Ventricular Rate:  67 PR Interval:  156 QRS Duration: 94 QT Interval:  396 QTC Calculation: 418 R Axis:   70 Text Interpretation:  Sinus rhythm No significant change since last  tracing Confirmed by Glynn Octave [phone]) on 11/22/2014 12:05:00  AM      MDM   Final diagnoses:  None    Patient presents to the emergency department for chest pain. Her history is not consistent with ACS, there is no exertional component. She has been seen emergency department several times for chest pain in the past. She is PERC negative.  Heart score is currently 1 for risk factors, will obtain repeat troponin and EKG after 3 hour observation.  Repeat troponin negative.  HEART score remains 1, no acute episodes in the ED overnight.  EKG remains unchanged.  She appears well and in NAD, VS remain within her normal limits and she is safe forDC   I personally performed the services described in this documentation, which was scribed in my presence. The recorded information has been reviewed and is accurate.      Everlene Balls, MD 11/22/14 [phone]

## 2014-11-22 ENCOUNTER — Emergency Department (HOSPITAL_COMMUNITY): Payer: Medicaid Other

## 2014-11-22 LAB — I-STAT TROPONIN, ED
TROPONIN I, POC: 0 ng/mL (ref 0.00–0.08)
TROPONIN I, POC: 0.01 ng/mL (ref 0.00–0.08)

## 2014-11-22 LAB — CBC WITH DIFFERENTIAL/PLATELET
BASOS ABS: 0 10*3/uL (ref 0.0–0.1)
Basophils Relative: 1 %
EOS ABS: 0.1 10*3/uL (ref 0.0–0.7)
EOS PCT: 2 %
HCT: 28.6 % — ABNORMAL LOW (ref 36.0–46.0)
HEMOGLOBIN: 9.4 g/dL — AB (ref 12.0–15.0)
LYMPHS PCT: 38 %
Lymphs Abs: 2 10*3/uL (ref 0.7–4.0)
MCH: 25.5 pg — ABNORMAL LOW (ref 26.0–34.0)
MCHC: 32.9 g/dL (ref 30.0–36.0)
MCV: 77.5 fL — ABNORMAL LOW (ref 78.0–100.0)
Monocytes Absolute: 0.3 10*3/uL (ref 0.1–1.0)
Monocytes Relative: 5 %
NEUTROS PCT: 54 %
Neutro Abs: 2.9 10*3/uL (ref 1.7–7.7)
PLATELETS: 329 10*3/uL (ref 150–400)
RBC: 3.69 MIL/uL — AB (ref 3.87–5.11)
RDW: 15.3 % (ref 11.5–15.5)
WBC: 5.3 10*3/uL (ref 4.0–10.5)

## 2014-11-22 LAB — BASIC METABOLIC PANEL
Anion gap: 12 (ref 5–15)
BUN: 72 mg/dL — ABNORMAL HIGH (ref 6–20)
CALCIUM: 8.3 mg/dL — AB (ref 8.9–10.3)
CO2: 22 mmol/L (ref 22–32)
CREATININE: 3.12 mg/dL — AB (ref 0.44–1.00)
Chloride: 102 mmol/L (ref 101–111)
GFR, EST AFRICAN AMERICAN: 20 mL/min — AB (ref >60)
GFR, EST NON AFRICAN AMERICAN: 17 mL/min — AB (ref >60)
Glucose, Bld: 120 mg/dL — ABNORMAL HIGH (ref 65–99)
Potassium: 3.7 mmol/L (ref 3.5–5.1)
SODIUM: 136 mmol/L (ref 135–145)

## 2014-11-22 LAB — MAGNESIUM: MAGNESIUM: 1.7 mg/dL (ref 1.7–2.4)

## 2014-11-22 MED ORDER — MAGNESIUM SULFATE 2 GM/50ML IV SOLN
2.0000 g | Freq: Once | INTRAVENOUS | Status: AC
  Administered 2014-11-22: 2 g via INTRAVENOUS

## 2014-11-22 MED ORDER — MAGNESIUM SULFATE 2 GM/50ML IV SOLN
INTRAVENOUS | Status: AC
  Filled 2014-11-22: qty 50

## 2014-11-22 MED ORDER — LORAZEPAM 1 MG PO TABS
1.0000 mg | ORAL_TABLET | Freq: Once | ORAL | Status: AC
  Administered 2014-11-22: 1 mg via ORAL
  Filled 2014-11-22: qty 1

## 2014-11-22 NOTE — Discharge Instructions (Signed)
Nonspecific Chest Pain Patricia [name], your blood work and EKG today were normal.  See a primary care doctor within 3 days for close follow up of your recurrent chest pain.  If symptoms worsen, come back to the ED immediately.  Thank you. It is often hard to find the cause of chest pain. There is always a chance that your pain could be related to something serious, such as a heart attack or a blood clot in your lungs. Chest pain can also be caused by conditions that are not life-threatening. If you have chest pain, it is very important to follow up with your doctor.  HOME CARE  If you were prescribed an antibiotic medicine, finish it all even if you start to feel better.  Avoid any activities that cause chest pain.  Do not use any tobacco products, including cigarettes, chewing tobacco, or electronic cigarettes. If you need help quitting, ask your doctor.  Do not drink alcohol.  Take medicines only as told by your doctor.  Keep all follow-up visits as told by your doctor. This is important. This includes any further testing if your chest pain does not go away.  Your doctor may tell you to keep your head raised (elevated) while you sleep.  Make lifestyle changes as told by your doctor. These may include:  Getting regular exercise. Ask your doctor to suggest some activities that are safe for you.  Eating a heart-healthy diet. Your doctor or a diet specialist (dietitian) can help you to learn healthy eating options.  Maintaining a healthy weight.  Managing diabetes, if necessary.  Reducing stress. GET HELP IF:  Your chest pain does not go away, even after treatment.  You have a rash with blisters on your chest.  You have a fever. GET HELP RIGHT AWAY IF:  Your chest pain is worse.  You have an increasing cough, or you cough up blood.  You have severe belly (abdominal) pain.  You feel extremely weak.  You pass out (faint).  You have chills.  You have sudden, unexplained  chest discomfort.  You have sudden, unexplained discomfort in your arms, back, neck, or jaw.  You have shortness of breath at any time.  You suddenly start to sweat, or your skin gets clammy.  You feel nauseous.  You vomit.  You suddenly feel light-headed or dizzy.  Your heart begins to beat quickly, or it feels like it is skipping beats. These symptoms may be an emergency. Do not wait to see if the symptoms will go away. Get medical help right away. Call your local emergency services (911 in the [place] Do not drive yourself to the hospital.   This information is not intended to replace advice given to you by your health care provider. Make sure you discuss any questions you have with your health care provider.   Document Released: 06/10/2007 Document Revised: 01/12/2014 Document Reviewed: 07/28/2013 Elsevier Interactive Patient Education [employer].

## 2015-01-13 ENCOUNTER — Encounter (HOSPITAL_COMMUNITY): Payer: Self-pay | Admitting: Emergency Medicine

## 2015-01-13 ENCOUNTER — Emergency Department (HOSPITAL_COMMUNITY)
Admission: EM | Admit: 2015-01-13 | Discharge: 2015-01-13 | Disposition: A | Discharge status: Home / Self Care | Payer: Medicaid Other | Attending: Emergency Medicine | Admitting: Emergency Medicine

## 2015-01-13 ENCOUNTER — Emergency Department (HOSPITAL_COMMUNITY): Payer: Medicaid Other

## 2015-01-13 DIAGNOSIS — Z7982 Long term (current) use of aspirin: Secondary | ICD-10-CM | POA: Diagnosis not present

## 2015-01-13 DIAGNOSIS — I129 Hypertensive chronic kidney disease with stage 1 through stage 4 chronic kidney disease, or unspecified chronic kidney disease: Secondary | ICD-10-CM | POA: Diagnosis not present

## 2015-01-13 DIAGNOSIS — K219 Gastro-esophageal reflux disease without esophagitis: Secondary | ICD-10-CM | POA: Insufficient documentation

## 2015-01-13 DIAGNOSIS — R059 Cough, unspecified: Secondary | ICD-10-CM

## 2015-01-13 DIAGNOSIS — N183 Chronic kidney disease, stage 3 (moderate): Secondary | ICD-10-CM | POA: Insufficient documentation

## 2015-01-13 DIAGNOSIS — R05 Cough: Secondary | ICD-10-CM | POA: Insufficient documentation

## 2015-01-13 DIAGNOSIS — E782 Mixed hyperlipidemia: Secondary | ICD-10-CM | POA: Insufficient documentation

## 2015-01-13 DIAGNOSIS — E119 Type 2 diabetes mellitus without complications: Secondary | ICD-10-CM | POA: Insufficient documentation

## 2015-01-13 DIAGNOSIS — R079 Chest pain, unspecified: Secondary | ICD-10-CM | POA: Insufficient documentation

## 2015-01-13 DIAGNOSIS — Z862 Personal history of diseases of the blood and blood-forming organs and certain disorders involving the immune mechanism: Secondary | ICD-10-CM | POA: Insufficient documentation

## 2015-01-13 DIAGNOSIS — Z79899 Other long term (current) drug therapy: Secondary | ICD-10-CM | POA: Diagnosis not present

## 2015-01-13 LAB — CBC WITH DIFFERENTIAL/PLATELET
BASOS PCT: 0 %
Basophils Absolute: 0 10*3/uL (ref 0.0–0.1)
EOS ABS: 0.1 10*3/uL (ref 0.0–0.7)
Eosinophils Relative: 2 %
HEMATOCRIT: 27.1 % — AB (ref 36.0–46.0)
Hemoglobin: 8.4 g/dL — ABNORMAL LOW (ref 12.0–15.0)
Lymphocytes Relative: 29 %
Lymphs Abs: 1.7 10*3/uL (ref 0.7–4.0)
MCH: 24.1 pg — AB (ref 26.0–34.0)
MCHC: 31 g/dL (ref 30.0–36.0)
MCV: 77.9 fL — ABNORMAL LOW (ref 78.0–100.0)
MONO ABS: 0.6 10*3/uL (ref 0.1–1.0)
MONOS PCT: 10 %
NEUTROS ABS: 3.5 10*3/uL (ref 1.7–7.7)
Neutrophils Relative %: 59 %
Platelets: 332 10*3/uL (ref 150–400)
RBC: 3.48 MIL/uL — ABNORMAL LOW (ref 3.87–5.11)
RDW: 14.3 % (ref 11.5–15.5)
WBC: 5.9 10*3/uL (ref 4.0–10.5)

## 2015-01-13 LAB — BASIC METABOLIC PANEL
Anion gap: 11 (ref 5–15)
BUN: 72 mg/dL — AB (ref 6–20)
CALCIUM: 8.8 mg/dL — AB (ref 8.9–10.3)
CO2: 24 mmol/L (ref 22–32)
CREATININE: 3.1 mg/dL — AB (ref 0.44–1.00)
Chloride: 102 mmol/L (ref 101–111)
GFR, EST AFRICAN AMERICAN: 20 mL/min — AB (ref >60)
GFR, EST NON AFRICAN AMERICAN: 18 mL/min — AB (ref >60)
Glucose, Bld: 104 mg/dL — ABNORMAL HIGH (ref 65–99)
Potassium: 4.1 mmol/L (ref 3.5–5.1)
Sodium: 137 mmol/L (ref 135–145)

## 2015-01-13 LAB — I-STAT TROPONIN, ED: Troponin i, poc: 0 ng/mL (ref 0.00–0.08)

## 2015-01-13 MED ORDER — ACETAMINOPHEN 325 MG PO TABS
650.0000 mg | ORAL_TABLET | Freq: Once | ORAL | Status: AC
  Administered 2015-01-13: 650 mg via ORAL
  Filled 2015-01-13: qty 2

## 2015-01-13 NOTE — Discharge Instructions (Signed)
Your work-up today including EKG, labs, and CXR were normal for you baseline. Finish your azithromycin prescription. Follow-up with your nephrologist tomorrow as scheduled. Otherwise, you may follow-up with your primary care physician. Return to the ED for new or worsening symptoms.

## 2015-01-13 NOTE — ED Notes (Signed)
Patient transported to X-ray 

## 2015-01-13 NOTE — ED Notes (Signed)
Returned from [facility].  No change in pain.Marland Kitchen

## 2015-01-13 NOTE — ED Provider Notes (Signed)
CSN: [id number]     Arrival date & time 01/13/15  8527 History   First MD Initiated Contact with Patient 01/13/15 1003     Chief Complaint  Patient presents with  . Chest Pain     (Consider location/radiation/quality/duration/timing/severity/associated sxs/prior Treatment) Patient is a 40 y.o. female presenting with chest pain. The history is provided by the patient and medical records.  Chest Pain Associated symptoms: cough     40 year old female with history of hypertension, hyperlipidemia, headaches, diabetes, chronic kidney disease, presenting to the ED for cough and chest pain. Patient states she was diagnosed with pneumonia 5 days ago by her primary care physician. She states she was given a shot of steroids in the office and discharged home with a Z-Pak. She states she has 1 more day of medication to take. States last night she began having left-sided chest pain. States this has been constant, aching in nature. States does have some pain at her left arm as well. Pain is slightly worse with coughing. Her cough is dry, nonproductive. She does report some nausea but denies vomiting. No fever or chills. No further sick contacts. Patient has no known cardiac history. She has never been a smoker.  VSS.  Past Medical History  Diagnosis Date  . Hypertension   . Acid reflux   . High cholesterol   . History of blood transfusion "a couple"    "related to low counts"  . Headache(784.0)     "related to chemo; sometimes weekly" (09/12/2013)  . Type II diabetes mellitus ([place])     "took me off my RX ~ 04/2013"  . Iron deficiency anemia     "get epogen shots q month" (02/20/2014)  . Chronic kidney disease (CKD), stage III (moderate)   . 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 Ankle fracture surgery Right 1994  . Abdominal hysterectomy  2010    "laparoscopic"  . Fracture surgery    .  Cardiac catheterization  2000's   Family History  Problem Relation Age of Onset  . Hypertension Mother   . Thyroid disease Mother   . Coronary artery disease Father   . Hypertension Father   . Diabetes Father    Social History  Substance Use Topics  . Smoking status: Never Smoker   . Smokeless tobacco: Never Used  . Alcohol Use: No   OB History    Gravida Para Term Preterm AB TAB SAB Ectopic Multiple Living   3        3      Review of Systems  Respiratory: Positive for cough.   Cardiovascular: Positive for chest pain.  All other systems reviewed and are negative.     Allergies  Nsaids  Home Medications   Prior to Admission medications   Medication Sig Start Date End Date Taking? Authorizing Provider  amLODipine (NORVASC) 5 MG tablet Take 5 mg by mouth daily. 08/12/14  Yes Historical Provider, MD  aspirin 81 MG chewable tablet Chew 1 tablet (81 mg total) by mouth daily. 10/03/13  Yes Eugenie Filler, MD  atorvastatin (LIPITOR) 40 MG tablet Take 40 mg by mouth daily. 03/14/14  Yes Historical Provider, MD  azithromycin (ZITHROMAX) 250 MG tablet Take 250 mg by mouth daily.   Yes Historical Provider, MD  calcium acetate (PHOSLO) 667 MG tablet Take 1,334 mg by mouth 3 (three) times  daily. 08/02/14 08/02/15 Yes Historical Provider, MD  Cholecalciferol (VITAMIN D-1000 MAX ST) 1000 UNITS tablet Take 1,000 Units by mouth daily. 08/02/14 08/02/15 Yes Historical Provider, MD  colchicine 0.6 MG tablet Take 0.6 mg by mouth every other day. 10/30/14 10/30/15 Yes Historical Provider, MD  diphenhydramine-acetaminophen (TYLENOL PM) 25-500 MG TABS tablet Take 2 tablets by mouth at bedtime as needed. For sleep   Yes Historical Provider, MD  lisinopril (PRINIVIL,ZESTRIL) 10 MG tablet Take 10 mg by mouth daily.   Yes Historical Provider, MD  metolazone (ZAROXOLYN) 2.5 MG tablet Take 1 tablet (2.5 mg total) by mouth daily as needed (for leg swellings or shortness of breath). 04/12/14  Yes Nishant  Dhungel, MD  metoprolol tartrate (LOPRESSOR) 25 MG tablet Take 1 tablet (25 mg total) by mouth 2 (two) times daily. 10/03/13  Yes Eugenie Filler, MD  Multiple Vitamin (MULTIVITAMIN WITH MINERALS) TABS tablet Take 1 tablet by mouth daily.   Yes Historical Provider, MD  omeprazole (PRILOSEC) 40 MG capsule Take 40 mg by mouth daily.     Yes Historical Provider, MD  potassium chloride (K-DUR) 10 MEQ tablet Take 10 mEq by mouth daily. 08/12/14  Yes Historical Provider, MD  promethazine (PHENERGAN) 25 MG tablet Take 25 mg by mouth every 6 (six) hours as needed for nausea or vomiting.   Yes Historical Provider, MD  traZODone (DESYREL) 50 MG tablet Take 50 mg by mouth at bedtime as needed for sleep.    Yes Historical Provider, MD  lidocaine (LIDODERM) 5 % Place 1 patch onto the skin daily. Remove & Discard patch within 12 hours or as directed by MD Patient not taking: Reported on 01/13/2015 08/21/14   April Palumbo, MD  methocarbamol (ROBAXIN) 500 MG tablet Take 1 tablet (500 mg total) by mouth 2 (two) times daily. Patient not taking: Reported on 01/13/2015 08/21/14   April Palumbo, MD  metoCLOPramide (REGLAN) 10 MG tablet Take 1 tablet (10 mg total) by mouth every 6 (six) hours. Patient taking differently: Take 10 mg by mouth every 6 (six) hours as needed for nausea.  09/20/14   Tatyana Kirichenko, PA-C  SUMAtriptan (IMITREX) 50 MG tablet Take 50 mg by mouth 2 (two) times daily as needed for migraine. May repeat in 2 hours if headache persists or recurs.    Historical Provider, MD  torsemide (DEMADEX) 20 MG tablet Take 2 tablets (40 mg total) by mouth daily. Patient taking differently: Take 80 mg by mouth daily.  04/12/14   Nishant Dhungel, MD  traMADol (ULTRAM) 50 MG tablet Take 1 tablet (50 mg total) by mouth every 6 (six) hours as needed. 09/20/14   Tatyana Kirichenko, PA-C   BP 116/71 mmHg  Pulse 56  Temp(Src) 98.3 F (36.8 C) (Oral)  Resp 14  Ht $R'5\' 7"'KI$  (1.702 m)  Wt 103.874 kg  BMI 35.86 kg/m2  SpO2  100%   Physical Exam  Constitutional: She is oriented to person, place, and time. She appears well-developed and well-nourished. No distress.  HENT:  Head: Normocephalic and atraumatic.  Mouth/Throat: Oropharynx is clear and moist.  Eyes: Conjunctivae and EOM are normal. Pupils are equal, round, and reactive to light.  Neck: Normal range of motion. Neck supple.  Cardiovascular: Normal rate, regular rhythm and normal heart sounds.   Pulmonary/Chest: Effort normal and breath sounds normal. No respiratory distress. She has no wheezes.  Left anterior chest wall tender to palpation; no deformities noted  Abdominal: Soft. Bowel sounds are normal. There is no tenderness.  Musculoskeletal:  Normal range of motion. She exhibits no edema.  Neurological: She is alert and oriented to person, place, and time.  Skin: Skin is warm and dry. She is not diaphoretic.  Psychiatric: She has a normal mood and affect.  Nursing note and vitals reviewed.   ED Course  Procedures (including critical care time) Labs Review Labs Reviewed  CBC WITH DIFFERENTIAL/PLATELET - Abnormal; Notable for the following:    RBC 3.48 (*)    Hemoglobin 8.4 (*)    HCT 27.1 (*)    MCV 77.9 (*)    MCH 24.1 (*)    All other components within normal limits  BASIC METABOLIC PANEL - Abnormal; Notable for the following:    Glucose, Bld 104 (*)    BUN 72 (*)    Creatinine, Ser 3.10 (*)    Calcium 8.8 (*)    GFR calc non Af Amer 18 (*)    GFR calc Af Amer 20 (*)    All other components within normal limits  Randolm Idol, ED    Imaging Review Dg Chest 2 View  01/13/2015  CLINICAL DATA:  Reported pneumonia 5 days ago. New left-sided chest pain starting last night. History of hypertension. EXAM: CHEST  2 VIEW COMPARISON:  None. FINDINGS: The heart size and mediastinal contours are within normal limits. Both lungs are clear. The visualized skeletal structures are unremarkable. IMPRESSION: Normal chest x-ray.  No evidence of  pneumonia. Electronically Signed   By: Franki Cabot M.D.   On: 01/13/2015 11:19   I have personally reviewed and evaluated these images and lab results as part of my medical decision-making.   EKG Interpretation   Date/Time:  Sunday January 13 2015 10:00:39 EST Ventricular Rate:  59 PR Interval:  154 QRS Duration: 103 QT Interval:  439 QTC Calculation: 435 R Axis:   38 Text Interpretation:  Sinus rhythm Normal ECG Confirmed by DELO  MD,  DOUGLAS ([id number]) on 01/13/2015 10:29:39 AM      MDM   Final diagnoses:  Chest pain, unspecified chest pain type  Cough   40 year old female here with chest pain. Recently diagnosed with pneumonia, currently finishing Z-Pak. Patient is afebrile, nontoxic. She is in no acute distress. Patient has been seen several times for chest pain in the past with negative work-ups.  Most recent 2D echo on 02/20/14 with estimated LVEF of 55-60%.  EKG today remains NSR, no acute ischemia.  Lab work is baseline for patient when compared with prior values.  CXR is clear, no evidence of CAP.  Patient is low risk, heart score of 2 for RF (HTN, HLP, DM).  PERC negative.  VS remain stable.  Given that pain has been ongoing since last night with negative work-up here, i have low suspcion for ACS, PE, dissection, or other acute cardiac event at this time.  I feel patient is stable for discharge.  Patient has appt at [facility] with her nephrologist tomorrow.  Discussed plan with patient, he/she acknowledged understanding and agreed with plan of care.  Return precautions given for new or worsening symptoms.  Larene Pickett, PA-C 01/13/15 [place], MD 01/13/15 1246

## 2015-01-13 NOTE — ED Notes (Signed)
Diagnosed with pneumonia 5 days ago, given z pak and "a shot" of steroid.  Last night began having pain left chest that radiates down left arms at times.  Some nausea.  No vomiting.

## 2015-01-18 ENCOUNTER — Emergency Department (HOSPITAL_COMMUNITY): Payer: Medicaid Other

## 2015-01-18 ENCOUNTER — Encounter (HOSPITAL_COMMUNITY): Payer: Self-pay | Admitting: Emergency Medicine

## 2015-01-18 ENCOUNTER — Emergency Department (HOSPITAL_COMMUNITY)
Admission: EM | Admit: 2015-01-18 | Discharge: 2015-01-19 | Disposition: A | Discharge status: Home / Self Care | Payer: Medicaid Other | Attending: Emergency Medicine | Admitting: Emergency Medicine

## 2015-01-18 DIAGNOSIS — Z9889 Other specified postprocedural states: Secondary | ICD-10-CM | POA: Insufficient documentation

## 2015-01-18 DIAGNOSIS — K219 Gastro-esophageal reflux disease without esophagitis: Secondary | ICD-10-CM | POA: Diagnosis not present

## 2015-01-18 DIAGNOSIS — Z792 Long term (current) use of antibiotics: Secondary | ICD-10-CM | POA: Diagnosis not present

## 2015-01-18 DIAGNOSIS — E7800 Pure hypercholesterolemia, unspecified: Secondary | ICD-10-CM | POA: Diagnosis not present

## 2015-01-18 DIAGNOSIS — G43809 Other migraine, not intractable, without status migrainosus: Secondary | ICD-10-CM

## 2015-01-18 DIAGNOSIS — G43909 Migraine, unspecified, not intractable, without status migrainosus: Secondary | ICD-10-CM | POA: Insufficient documentation

## 2015-01-18 DIAGNOSIS — R29818 Other symptoms and signs involving the nervous system: Secondary | ICD-10-CM | POA: Insufficient documentation

## 2015-01-18 DIAGNOSIS — I129 Hypertensive chronic kidney disease with stage 1 through stage 4 chronic kidney disease, or unspecified chronic kidney disease: Secondary | ICD-10-CM | POA: Insufficient documentation

## 2015-01-18 DIAGNOSIS — N183 Chronic kidney disease, stage 3 (moderate): Secondary | ICD-10-CM | POA: Insufficient documentation

## 2015-01-18 DIAGNOSIS — R531 Weakness: Secondary | ICD-10-CM | POA: Insufficient documentation

## 2015-01-18 DIAGNOSIS — R410 Disorientation, unspecified: Secondary | ICD-10-CM | POA: Insufficient documentation

## 2015-01-18 DIAGNOSIS — Z7982 Long term (current) use of aspirin: Secondary | ICD-10-CM | POA: Insufficient documentation

## 2015-01-18 DIAGNOSIS — Z79899 Other long term (current) drug therapy: Secondary | ICD-10-CM | POA: Diagnosis not present

## 2015-01-18 DIAGNOSIS — Z862 Personal history of diseases of the blood and blood-forming organs and certain disorders involving the immune mechanism: Secondary | ICD-10-CM | POA: Insufficient documentation

## 2015-01-18 DIAGNOSIS — E119 Type 2 diabetes mellitus without complications: Secondary | ICD-10-CM | POA: Diagnosis not present

## 2015-01-18 LAB — APTT: aPTT: 26 seconds (ref 24–37)

## 2015-01-18 LAB — CBC WITH DIFFERENTIAL/PLATELET
BASOS PCT: 1 %
Basophils Absolute: 0.1 10*3/uL (ref 0.0–0.1)
EOS PCT: 2 %
Eosinophils Absolute: 0.2 10*3/uL (ref 0.0–0.7)
HEMATOCRIT: 25.3 % — AB (ref 36.0–46.0)
Hemoglobin: 8.4 g/dL — ABNORMAL LOW (ref 12.0–15.0)
Lymphocytes Relative: 19 %
Lymphs Abs: 1.5 10*3/uL (ref 0.7–4.0)
MCH: 25.1 pg — AB (ref 26.0–34.0)
MCHC: 33.2 g/dL (ref 30.0–36.0)
MCV: 75.7 fL — AB (ref 78.0–100.0)
MONO ABS: 0.5 10*3/uL (ref 0.1–1.0)
Monocytes Relative: 6 %
NEUTROS PCT: 72 %
Neutro Abs: 5.5 10*3/uL (ref 1.7–7.7)
PLATELETS: 364 10*3/uL (ref 150–400)
RBC: 3.34 MIL/uL — ABNORMAL LOW (ref 3.87–5.11)
RDW: 14.1 % (ref 11.5–15.5)
WBC: 7.8 10*3/uL (ref 4.0–10.5)

## 2015-01-18 LAB — I-STAT CHEM 8, ED
BUN: 91 mg/dL — ABNORMAL HIGH (ref 6–20)
CALCIUM ION: 0.97 mmol/L — AB (ref 1.12–1.23)
Chloride: 105 mmol/L (ref 101–111)
Creatinine, Ser: 3.9 mg/dL — ABNORMAL HIGH (ref 0.44–1.00)
GLUCOSE: 93 mg/dL (ref 65–99)
HCT: 28 % — ABNORMAL LOW (ref 36.0–46.0)
HEMOGLOBIN: 9.5 g/dL — AB (ref 12.0–15.0)
Potassium: 4.3 mmol/L (ref 3.5–5.1)
SODIUM: 136 mmol/L (ref 135–145)
TCO2: 19 mmol/L (ref 0–100)

## 2015-01-18 LAB — I-STAT TROPONIN, ED: Troponin i, poc: 0 ng/mL (ref 0.00–0.08)

## 2015-01-18 LAB — PROTIME-INR
INR: 1.03 (ref 0.00–1.49)
Prothrombin Time: 13.7 seconds (ref 11.6–15.2)

## 2015-01-18 MED ORDER — DEXAMETHASONE SODIUM PHOSPHATE 10 MG/ML IJ SOLN
10.0000 mg | Freq: Once | INTRAMUSCULAR | Status: AC
  Administered 2015-01-19: 10 mg via INTRAVENOUS
  Filled 2015-01-18: qty 1

## 2015-01-18 MED ORDER — DIPHENHYDRAMINE HCL 50 MG/ML IJ SOLN
25.0000 mg | Freq: Once | INTRAMUSCULAR | Status: AC
  Administered 2015-01-19: 25 mg via INTRAVENOUS
  Filled 2015-01-18: qty 1

## 2015-01-18 MED ORDER — SODIUM CHLORIDE 0.9 % IV BOLUS (SEPSIS)
1000.0000 mL | Freq: Once | INTRAVENOUS | Status: AC
  Administered 2015-01-19: 1000 mL via INTRAVENOUS

## 2015-01-18 MED ORDER — SODIUM CHLORIDE 0.9 % IV BOLUS (SEPSIS): 1000.0000 mL | Freq: Once | INTRAVENOUS | Status: DC

## 2015-01-18 MED ORDER — METOCLOPRAMIDE HCL 5 MG/ML IJ SOLN
10.0000 mg | Freq: Once | INTRAMUSCULAR | Status: AC
  Administered 2015-01-19: 10 mg via INTRAVENOUS
  Filled 2015-01-18: qty 2

## 2015-01-18 NOTE — ED Notes (Signed)
Spoke to [place] with Phlebotomy.  Will come to collect blood on patient.

## 2015-01-18 NOTE — ED Notes (Signed)
Per EMS:  PT was at church this evening, and her friends said she "fell out".  Pt was lowered to the ground, and had eye movement, but did not respond to everyone for approx 4 minutes.  Patient c/o weakness with EMS, would not move any extremities, but regained strength in right arm during transport.  Pt alert, but not verbal at this time.

## 2015-01-18 NOTE — ED Notes (Signed)
Neurology at bedside.  Requesting patient to go to MRI.  Will attempt blood from IV before transport.

## 2015-01-18 NOTE — Code Documentation (Signed)
Patricia [name] is a 40yo bf presenting to the [facility] via [facility] after having an episode of lightheadedness that resulted in Lt side weakness and facial muscle twitching.  On exam the pt is alert and f/c.  Exam is inconsistent. LUE does not resist gravity but she protects her face when allowed to drop.  She is able to lift her left leg off the bed for a few seconds.  When her face is relaxed there is no apparent facial droop, and with movement she begins to have bil.  Twitching.NIH 10. MRI planned.

## 2015-01-18 NOTE — ED Notes (Signed)
Pt transported to CT with this RN, neurology and rapid response RN.  Pt now in MRI with the same

## 2015-01-18 NOTE — ED Provider Notes (Signed)
CSN: [id number]     Arrival date & time 01/18/15  2224 History   First MD Initiated Contact with Patient 01/18/15 2235     Chief Complaint  Patient presents with  . Weakness     (Consider location/radiation/quality/duration/timing/severity/associated sxs/prior Treatment) Patient is a 40 y.o. female presenting with altered mental status.  Altered Mental Status Presenting symptoms: behavior changes and confusion   Severity:  Mild Most recent episode:  Today Episode history:  Single Timing:  Constant Progression:  Unchanged Chronicity:  New Context: not alcohol use and not dementia   Associated symptoms: no abdominal pain     Past Medical History  Diagnosis Date  . Hypertension   . Acid reflux   . High cholesterol   . History of blood transfusion "a couple"    "related to low counts"  . Headache(784.0)     "related to chemo; sometimes weekly" (09/12/2013)  . Type II diabetes mellitus ([place])     "took me off my RX ~ 04/2013"  . Iron deficiency anemia     "get epogen shots q month" (02/20/2014)  . Chronic kidney disease (CKD), stage III (moderate)   . 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 Ankle fracture surgery Right 1994  . Abdominal hysterectomy  2010    "laparoscopic"  . Fracture surgery    . Cardiac catheterization  2000's   Family History  Problem Relation Age of Onset  . Hypertension Mother   . Thyroid disease Mother   . Coronary artery disease Father   . Hypertension Father   . Diabetes Father    Social History  Substance Use Topics  . Smoking status: Never Smoker   . Smokeless tobacco: Never Used  . Alcohol Use: No   OB History    Gravida Para Term Preterm AB TAB SAB Ectopic Multiple Living   3        3      Review of Systems  Gastrointestinal: Negative for abdominal pain.  Psychiatric/Behavioral: Positive for confusion.       Allergies  Nsaids  Home Medications   Prior to Admission medications   Medication Sig Start Date End Date Taking? Authorizing Provider  amLODipine (NORVASC) 5 MG tablet Take 5 mg by mouth daily. 08/12/14   Historical Provider, MD  aspirin 81 MG chewable tablet Chew 1 tablet (81 mg total) by mouth daily. 10/03/13   Eugenie Filler, MD  atorvastatin (LIPITOR) 40 MG tablet Take 40 mg by mouth daily. 03/14/14   Historical Provider, MD  azithromycin (ZITHROMAX) 250 MG tablet Take 250 mg by mouth daily.    Historical Provider, MD  calcium acetate (PHOSLO) 667 MG tablet Take 1,334 mg by mouth 3 (three) times daily. 08/02/14 08/02/15  Historical Provider, MD  Cholecalciferol (VITAMIN D-1000 MAX ST) 1000 UNITS tablet Take 1,000 Units by mouth daily. 08/02/14 08/02/15  Historical Provider, MD  colchicine 0.6 MG tablet Take 0.6 mg by mouth every other day. 10/30/14 10/30/15  Historical Provider, MD  diphenhydramine-acetaminophen (TYLENOL PM) 25-500 MG TABS tablet Take 2 tablets by mouth at bedtime as needed. For sleep    Historical Provider, MD  lidocaine (LIDODERM) 5 % Place 1 patch onto the skin daily. Remove & Discard patch within 12 hours or as directed by MD Patient not taking: Reported on 01/13/2015 08/21/14   April Palumbo, MD  lisinopril (PRINIVIL,ZESTRIL) 10  MG tablet Take 10 mg by mouth daily.    Historical Provider, MD  methocarbamol (ROBAXIN) 500 MG tablet Take 1 tablet (500 mg total) by mouth 2 (two) times daily. Patient not taking: Reported on 01/13/2015 08/21/14   April Palumbo, MD  metoCLOPramide (REGLAN) 10 MG tablet Take 1 tablet (10 mg total) by mouth every 6 (six) hours. Patient taking differently: Take 10 mg by mouth every 6 (six) hours as needed for nausea.  09/20/14   Tatyana Kirichenko, PA-C  metolazone (ZAROXOLYN) 2.5 MG tablet Take 1 tablet (2.5 mg total) by mouth daily as needed (for leg swellings or shortness of breath). 04/12/14   Nishant Dhungel, MD  metoprolol tartrate  (LOPRESSOR) 25 MG tablet Take 1 tablet (25 mg total) by mouth 2 (two) times daily. 10/03/13   Eugenie Filler, MD  Multiple Vitamin (MULTIVITAMIN WITH MINERALS) TABS tablet Take 1 tablet by mouth daily.    Historical Provider, MD  omeprazole (PRILOSEC) 40 MG capsule Take 40 mg by mouth daily.      Historical Provider, MD  potassium chloride (K-DUR) 10 MEQ tablet Take 10 mEq by mouth daily. 08/12/14   Historical Provider, MD  promethazine (PHENERGAN) 25 MG tablet Take 25 mg by mouth every 6 (six) hours as needed for nausea or vomiting.    Historical Provider, MD  SUMAtriptan (IMITREX) 50 MG tablet Take 50 mg by mouth 2 (two) times daily as needed for migraine. May repeat in 2 hours if headache persists or recurs.    Historical Provider, MD  torsemide (DEMADEX) 20 MG tablet Take 2 tablets (40 mg total) by mouth daily. Patient taking differently: Take 80 mg by mouth daily.  04/12/14   Nishant Dhungel, MD  traMADol (ULTRAM) 50 MG tablet Take 1 tablet (50 mg total) by mouth every 6 (six) hours as needed. Patient taking differently: Take 50 mg by mouth every 6 (six) hours as needed for moderate pain.  09/20/14   Tatyana Kirichenko, PA-C  traZODone (DESYREL) 50 MG tablet Take 50 mg by mouth at bedtime as needed for sleep.     Historical Provider, MD   BP 109/66 mmHg  Pulse 63  Temp(Src) 98.4 F (36.9 C) (Oral)  Resp 18  SpO2 96% Physical Exam  Constitutional: She appears well-developed and well-nourished.  HENT:  Head: Normocephalic and atraumatic.  Neck: Normal range of motion.  Cardiovascular: Normal rate and regular rhythm.   Pulmonary/Chest: No stridor. No respiratory distress.  Abdominal: She exhibits no distension.  Neurological: She is alert.  Poor effort with left hand, however when asked to flex, she can't but will hold extended, when asked to extend she won't but will keep flexed against force.  Stutters when she speaks, but will speak and can mouth words she wants to say.  No obvious CN  deficits.  Pain perception intact in all four extremities.   Nursing note and vitals reviewed.   ED Course  Procedures (including critical care time) Labs Review Labs Reviewed  CBC WITH DIFFERENTIAL/PLATELET - Abnormal; Notable for the following:    RBC 3.34 (*)    Hemoglobin 8.4 (*)    HCT 25.3 (*)    MCV 75.7 (*)    MCH 25.1 (*)    All other components within normal limits  COMPREHENSIVE METABOLIC PANEL - Abnormal; Notable for the following:    CO2 19 (*)    BUN 94 (*)    Creatinine, Ser 3.77 (*)    Calcium 8.6 (*)    Total Protein  5.8 (*)    Albumin 2.8 (*)    ALT 12 (*)    Alkaline Phosphatase 33 (*)    GFR calc non Af Amer 14 (*)    GFR calc Af Amer 16 (*)    All other components within normal limits  URINALYSIS, ROUTINE W REFLEX MICROSCOPIC (NOT AT [facility]) - Abnormal; Notable for the following:    Glucose, UA 100 (*)    Hgb urine dipstick SMALL (*)    Protein, ur >300 (*)    All other components within normal limits  URINE MICROSCOPIC-ADD ON - Abnormal; Notable for the following:    Squamous Epithelial / LPF 0-5 (*)    Bacteria, UA RARE (*)    All other components within normal limits  I-STAT CHEM 8, ED - Abnormal; Notable for the following:    BUN 91 (*)    Creatinine, Ser 3.90 (*)    Calcium, Ion 0.97 (*)    Hemoglobin 9.5 (*)    HCT 28.0 (*)    All other components within normal limits  TROPONIN I  PROTIME-INR  APTT  URINE RAPID DRUG SCREEN, HOSP PERFORMED  I-STAT TROPOININ, ED    Imaging Review No results found. I have personally reviewed and evaluated these images and lab results as part of my medical decision-making.   EKG Interpretation   Date/Time:  Friday January 18 2015 22:44:08 EST Ventricular Rate:  74 PR Interval:  158 QRS Duration: 96 QT Interval:  398 QTC Calculation: 442 R Axis:   53 Text Interpretation:  Sinus rhythm Confirmed by [facility] MD, Corene Cornea [phone])  on 01/18/2015 11:48:46 PM      MDM   Final diagnoses:  Neurologic  abnormality  Weakness  Other migraine without status migrainosus, not intractable    40 yo F here with difficulty speaking after possible syncopal episode and apparent intermittent, inconsistent left arm weakness that comes and goes depending on part of arm your are testing. No obvious consistent deficit hwoever 2/2 age and unclear story I consulted neurology who evalauted and felt we should call a Code Stroke which was done and unremarkable. He thought it to be a possible complicated migraine, started treating migraine and sy ptoms started to resolve. Patietn will likely continue to improve and can be discharged when back to baseline.     Merrily Pew, MD 01/21/15 1640

## 2015-01-18 NOTE — ED Notes (Signed)
Pt grunts and will respond yes or no with grunts, can whisper but stutters when attempts to speak

## 2015-01-18 NOTE — ED Notes (Signed)
MD at bedside. 

## 2015-01-19 DIAGNOSIS — R531 Weakness: Secondary | ICD-10-CM

## 2015-01-19 LAB — COMPREHENSIVE METABOLIC PANEL
ALK PHOS: 33 U/L — AB (ref 38–126)
ALT: 12 U/L — AB (ref 14–54)
AST: 21 U/L (ref 15–41)
Albumin: 2.8 g/dL — ABNORMAL LOW (ref 3.5–5.0)
Anion gap: 14 (ref 5–15)
BILIRUBIN TOTAL: 0.4 mg/dL (ref 0.3–1.2)
BUN: 94 mg/dL — ABNORMAL HIGH (ref 6–20)
CALCIUM: 8.6 mg/dL — AB (ref 8.9–10.3)
CHLORIDE: 105 mmol/L (ref 101–111)
CO2: 19 mmol/L — ABNORMAL LOW (ref 22–32)
CREATININE: 3.77 mg/dL — AB (ref 0.44–1.00)
GFR, EST AFRICAN AMERICAN: 16 mL/min — AB (ref >60)
GFR, EST NON AFRICAN AMERICAN: 14 mL/min — AB (ref >60)
Glucose, Bld: 95 mg/dL (ref 65–99)
Potassium: 4.6 mmol/L (ref 3.5–5.1)
Sodium: 138 mmol/L (ref 135–145)
TOTAL PROTEIN: 5.8 g/dL — AB (ref 6.5–8.1)

## 2015-01-19 LAB — URINALYSIS, ROUTINE W REFLEX MICROSCOPIC
Bilirubin Urine: NEGATIVE
GLUCOSE, UA: 100 mg/dL — AB
KETONES UR: NEGATIVE mg/dL
LEUKOCYTES UA: NEGATIVE
Nitrite: NEGATIVE
Specific Gravity, Urine: 1.015 (ref 1.005–1.030)
pH: 5.5 (ref 5.0–8.0)

## 2015-01-19 LAB — RAPID URINE DRUG SCREEN, HOSP PERFORMED
Amphetamines: NOT DETECTED
BARBITURATES: NOT DETECTED
BENZODIAZEPINES: NOT DETECTED
COCAINE: NOT DETECTED
Opiates: NOT DETECTED
TETRAHYDROCANNABINOL: NOT DETECTED

## 2015-01-19 LAB — URINE MICROSCOPIC-ADD ON

## 2015-01-19 LAB — TROPONIN I

## 2015-01-19 MED ORDER — HALOPERIDOL LACTATE 5 MG/ML IJ SOLN
1.0000 mg | Freq: Once | INTRAMUSCULAR | Status: AC
  Administered 2015-01-19: 1 mg via INTRAVENOUS
  Filled 2015-01-19: qty 1

## 2015-01-19 MED ORDER — MAGNESIUM SULFATE IN D5W 10-5 MG/ML-% IV SOLN
1.0000 g | Freq: Once | INTRAVENOUS | Status: AC
  Administered 2015-01-19: 1 g via INTRAVENOUS
  Filled 2015-01-19: qty 100

## 2015-01-19 NOTE — Consult Note (Signed)
Stroke Consult Consulting Physician: Dr Dayna Barker  Chief Complaint: weakness, speech deficits  HPI: Patricia [name] is an 40 y.o. female hx of HTN, CKD brought in by EMS with weakness and speech deficits. Per family patient was LSW at 2100. She suddenly collapsed, family notes she was not responding, no abnormal movements. Upon EMS arrival she reported being unable to move any extremities along with being unable to communicate. EMS does note that she intermittently would use her RUE and could occasionally get her words out. Patient reports a left-sided headache, unable to further explain characteristics of headaches. Code stroke called upon arrival to ED.   CT head imaging reviewed and negative for any acute process.   Date last known well: 01/18/2015 Time last known well: 2100 tPA Given: no, symptoms not consistent with CVA, MRI negative Modified Rankin: Rankin Score=1  Past Medical History  Diagnosis Date  . Hypertension   . Acid reflux   . High cholesterol   . History of blood transfusion "a couple"    "related to low counts"  . Headache(784.0)     "related to chemo; sometimes weekly" (09/12/2013)  . Type II diabetes mellitus ([place])     "took me off my RX ~ 04/2013"  . Iron deficiency anemia     "get epogen shots q month" (02/20/2014)  . Chronic kidney disease (CKD), stage III (moderate)   . MCGN (minimal change glomerulonephritis)     "using chemo to tx;  finished my last tx in 12/2013"    Past Surgical History  Procedure Laterality Date  . Ankle fracture surgery Right 1994  . Abdominal hysterectomy  2010    "laparoscopic"  . Fracture surgery    . Cardiac catheterization  2000's    Family History  Problem Relation Age of Onset  . Hypertension Mother   . Thyroid disease Mother   . Coronary artery disease Father   . Hypertension Father   . Diabetes Father    Social History:  reports that she has never smoked. She has never used smokeless tobacco. She reports that she does  not drink alcohol or use illicit drugs.  Allergies:  Allergies  Allergen Reactions  . Nsaids Other (See Comments)    Cannot take due to Kidney disease     (Not in a hospital admission)  ROS: Out of a complete 14 system review, the patient complains of only the following symptoms, and all other reviewed systems are negative. +weakness, speech deficits  Physical Examination: Filed Vitals:   01/18/15 2244  BP: 120/65  Pulse: 75  Temp: 98.4 F (36.9 C)  Resp: 18   Physical Exam  Constitutional: He appears well-developed and well-nourished.  Psych: Affect appropriate to situation Eyes: No scleral injection HENT: No OP obstrucion Head: Normocephalic.  Cardiovascular: Normal rate and regular rhythm.  Respiratory: Effort normal and breath sounds normal.  GI: Soft. Bowel sounds are normal. No distension. There is no tenderness.  Skin: WDI   Neurologic Examination: Mental Status: Alert, makes eye contact, attempts to speak. Speech pertinent for slow stuttering, able to correctly identify majority of items on NIHSS cards. Follows commands without difficulty Cranial Nerves: II: optic discs not visualized, visual fields grossly normal, pupils equal, round, reactive to light and accommodation III,IV, VI: ptosis not present, extra-ocular motions intact bilaterally V,VII: face symmetric at rest, with activation remains symmetric though with intermittent twitching of left side of face which resolves with rest, facial light touch sensation normal bilaterally VIII: hearing normal bilaterally IX,X: gag reflex  present XI: trapezius strength/neck flexion strength normal bilaterally XII: tongue strength normal  Motor: RUE and RLE 5/5 strength Unable to life LUE against gravity but with distraction able to hold aloft and will protect face when arm is dropped Able to briefly lift LLE against gravity, appears to be effort dependent weakness with + Hoovers sign Tone and bulk:normal tone  throughout; no atrophy noted Sensory: Pinprick and light touch diminished on the left side Deep Tendon Reflexes: 2+ and symmetric throughout Plantars: Right: downgoing   Left: downgoing Cerebellar: normal finger-to-nose, normal rapid alternating movements and normal heel-to-shin test Gait: normal gait and station  Laboratory Studies:   Basic Metabolic Panel:  Recent Labs Lab 01/13/15 1025 01/18/15 2321 01/18/15 2324  NA 137 138 136  K 4.1 4.6 4.3  CL 102 105 105  CO2 24 19*  --   GLUCOSE 104* 95 93  BUN 72* 94* 91*  CREATININE 3.10* 3.77* 3.90*  CALCIUM 8.8* 8.6*  --     Liver Function Tests:  Recent Labs Lab 01/18/15 2321  AST 21  ALT 12*  ALKPHOS 33*  BILITOT 0.4  PROT 5.8*  ALBUMIN 2.8*   No results for input(s): LIPASE, AMYLASE in the last 168 hours. No results for input(s): AMMONIA in the last 168 hours.  CBC:  Recent Labs Lab 01/13/15 1025 01/18/15 2321 01/18/15 2324  WBC 5.9 7.8  --   NEUTROABS 3.5 5.5  --   HGB 8.4* 8.4* 9.5*  HCT 27.1* 25.3* 28.0*  MCV 77.9* 75.7*  --   PLT 332 364  --     Cardiac Enzymes:  Recent Labs Lab 01/18/15 2321  TROPONINI <0.03    BNP: Invalid input(s): POCBNP  CBG: No results for input(s): GLUCAP in the last 168 hours.  Microbiology: Results for orders placed or performed during the hospital encounter of 08/20/14  Urine culture     Status: None   Collection Time: 08/20/14 10:58 PM  Result Value Ref Range Status   Specimen Description URINE, CLEAN CATCH  Final   Special Requests ADDED 0459 08/21/14  Final   Culture MULTIPLE SPECIES PRESENT, SUGGEST RECOLLECTION  Final   Report Status 08/22/2014 FINAL  Final    Coagulation Studies:  Recent Labs  01/18/15 2315  LABPROT 13.7  INR 1.03    Urinalysis: No results for input(s): COLORURINE, LABSPEC, PHURINE, GLUCOSEU, HGBUR, BILIRUBINUR, KETONESUR, PROTEINUR, UROBILINOGEN, NITRITE, LEUKOCYTESUR in the last 168 hours.  Invalid input(s):  APPERANCEUR  Lipid Panel:     Component Value Date/Time   CHOL  02/26/2010 0330    175        ATP III CLASSIFICATION:  <200     mg/dL   Desirable  200-239  mg/dL   Borderline High  >=240    mg/dL   High          TRIG 122 02/26/2010 0330   HDL 33* 02/26/2010 0330   CHOLHDL 5.3 02/26/2010 0330   VLDL 24 02/26/2010 0330   LDLCALC * 02/26/2010 0330    118        Total Cholesterol/HDL:CHD Risk Coronary Heart Disease Risk Table                     Men   Women  1/2 Average Risk   3.4   3.3  Average Risk       5.0   4.4  2 X Average Risk   9.6   7.1  3 X Average Risk  23.4  11.0        Use the calculated Patient Ratio above and the CHD Risk Table to determine the patient's CHD Risk.        ATP III CLASSIFICATION (LDL):  <100     mg/dL   Optimal  100-129  mg/dL   Near or Above                    Optimal  130-159  mg/dL   Borderline  160-189  mg/dL   High  >190     mg/dL   Very High    HgbA1C:  Lab Results  Component Value Date   HGBA1C 5.9* 02/20/2014    Urine Drug Screen:     Component Value Date/Time   LABOPIA POSITIVE* 09/28/2013 0602   COCAINSCRNUR NONE DETECTED 09/28/2013 0602   LABBENZ NONE DETECTED 09/28/2013 0602   AMPHETMU NONE DETECTED 09/28/2013 0602   THCU NONE DETECTED 09/28/2013 0602   LABBARB NONE DETECTED 09/28/2013 0602    Alcohol Level: No results for input(s): ETH in the last 168 hours.  Other results:  Imaging: Ct Head Wo Contrast  01/18/2015  CLINICAL DATA:  Code stroke. Expressive aphasia. Trembling. Initial encounter. EXAM: CT HEAD WITHOUT CONTRAST TECHNIQUE: Contiguous axial images were obtained from the base of the skull through the vertex without intravenous contrast. COMPARISON:  None. FINDINGS: There is no evidence of acute infarction, mass lesion, or intra- or extra-axial hemorrhage on CT. The posterior fossa, including the cerebellum, brainstem and fourth ventricle, is within normal limits. The third and lateral ventricles, and basal  ganglia are unremarkable in appearance. The cerebral hemispheres are symmetric in appearance, with normal gray-white differentiation. No mass effect or midline shift is seen. There is no evidence of fracture; visualized osseous structures are unremarkable in appearance. The orbits are within normal limits. The paranasal sinuses and mastoid air cells are well-aerated. No significant soft tissue abnormalities are seen. IMPRESSION: Unremarkable noncontrast CT of the head. These results were called by telephone at the time of interpretation on 01/18/2015 at 11:37 pm to Dr. Merrily Pew, who verbally acknowledged these results. Electronically Signed   By: Garald Balding M.D.   On: 01/18/2015 23:39    Assessment: 40 y.o. female hx of CKD, HTN presenting with sudden onset of speech deficits and left-sided weakness. Exam with multiple functional findings. MRI brain negative for acute process. Differential includes possible complex migraine. Anxiety/psych may be playing a role in her symptoms.   -migraine cocktail for symptomatic treatment of headache -no further neurological workup indicated at this time  Jim Like, DO [facility] [phone]  If 7pm- 7am, please page neurology on call as listed in [place]. 01/19/2015, 12:09 AM

## 2015-01-19 NOTE — Discharge Instructions (Signed)

## 2015-01-19 NOTE — ED Notes (Signed)
Patient able to ambulate unassisted to restroom. Prepare for discharge.

## 2015-01-19 NOTE — ED Notes (Signed)
Pt made aware of need for urine.  Will return to attempt bed pan after some of fluids in.

## 2015-01-19 NOTE — ED Notes (Signed)
Pt's earrings removed, placed in a container and placed in patient's room

## 2015-01-19 NOTE — ED Provider Notes (Signed)
Patient seen by Dr. Dayna Barker originally and signed out at end of shift.  Patient had a syncopal episode at church with associated weakness and headache.  Neurology has seen patient and thorough work-up completed. The patient was having symptoms of difficulty speaking/stuttering but was able to express herself with her hands. She also had transient right leg weakness.  She was given: Medications  sodium chloride 0.9 % bolus 1,000 mL (1,000 mLs Intravenous New Bag/Given 01/19/15 0045)  metoCLOPramide (REGLAN) injection 10 mg (10 mg Intravenous Given 01/19/15 0049)  diphenhydrAMINE (BENADRYL) injection 25 mg (25 mg Intravenous Given 01/19/15 0049)  dexamethasone (DECADRON) injection 10 mg (10 mg Intravenous Given 01/19/15 0051)  magnesium sulfate IVPB 1 g 100 mL (0 g Intravenous Stopped 01/19/15 0324)  haloperidol lactate (HALDOL) injection 1 mg (1 mg Intravenous Given 01/19/15 0202)   In the ER and the headache resolved. With the cessation of the headache her difficulty speaking and weakness resolved. She ambulated with no difficulty and is now speaking in full sentences with no difficulty and requesting discharge.  Labs Reviewed  CBC WITH DIFFERENTIAL/PLATELET - Abnormal; Notable for the following:    RBC 3.34 (*)    Hemoglobin 8.4 (*)    HCT 25.3 (*)    MCV 75.7 (*)    MCH 25.1 (*)    All other components within normal limits  COMPREHENSIVE METABOLIC PANEL - Abnormal; Notable for the following:    CO2 19 (*)    BUN 94 (*)    Creatinine, Ser 3.77 (*)    Calcium 8.6 (*)    Total Protein 5.8 (*)    Albumin 2.8 (*)    ALT 12 (*)    Alkaline Phosphatase 33 (*)    GFR calc non Af Amer 14 (*)    GFR calc Af Amer 16 (*)    All other components within normal limits  URINALYSIS, ROUTINE W REFLEX MICROSCOPIC (NOT AT [facility]) - Abnormal; Notable for the following:    Glucose, UA 100 (*)    Hgb urine dipstick SMALL (*)    Protein, ur >300 (*)    All other components within normal limits  URINE  MICROSCOPIC-ADD ON - Abnormal; Notable for the following:    Squamous Epithelial / LPF 0-5 (*)    Bacteria, UA RARE (*)    All other components within normal limits  I-STAT CHEM 8, ED - Abnormal; Notable for the following:    BUN 91 (*)    Creatinine, Ser 3.90 (*)    Calcium, Ion 0.97 (*)    Hemoglobin 9.5 (*)    HCT 28.0 (*)    All other components within normal limits  TROPONIN I  PROTIME-INR  APTT  URINE RAPID DRUG SCREEN, HOSP PERFORMED  ETHANOL  I-STAT TROPOININ, ED   CLINICAL DATA: Syncopal episode at church for 4 minutes, weakness. Posterior head pain. History of hypertension, anemia, diabetes, chronic kidney disease, headaches and hypercholesterolemia.  EXAM: MRI HEAD WITHOUT CONTRAST  TECHNIQUE: Multiplanar, multiecho pulse sequences of the brain and surrounding structures were obtained without intravenous contrast.  COMPARISON: CT head January 18, 2015 at 2321 hours  FINDINGS: The ventricles and sulci are normal for patient's age. No abnormal parenchymal signal, mass lesions, mass effect. No reduced diffusion to suggest acute ischemia. No susceptibility artifact to suggest hemorrhage.  No abnormal extra-axial fluid collections. No extra-axial masses though, contrast enhanced sequences would be more sensitive. Normal major intracranial vascular flow voids seen at the skull base.  Ocular globes and orbital  contents are unremarkable though not tailored for evaluation. No abnormal sellar expansion. No suspicious calvarial bone marrow signal, generalized low bone marrow signal compatible of history of anemia. Craniocervical junction maintained. Mild paranasal sinus mucosal thickening. Mastoid air cells are well aerated.  IMPRESSION: Normal noncontrast MRI of the brain.   Electronically Signed  By: Elon Alas M.D.  On: 01/19/2015 00:31   Patient given a referral to Neurology.  Delos Haring, PA-C 01/19/15 [id number]  Merrily Pew,  MD 01/21/15 [phone]

## 2015-01-23 ENCOUNTER — Emergency Department (HOSPITAL_COMMUNITY): Payer: Medicaid Other

## 2015-01-23 ENCOUNTER — Emergency Department (HOSPITAL_COMMUNITY)
Admission: EM | Admit: 2015-01-23 | Discharge: 2015-01-23 | Disposition: A | Discharge status: Home / Self Care | Payer: Medicaid Other | Attending: Emergency Medicine | Admitting: Emergency Medicine

## 2015-01-23 ENCOUNTER — Encounter (HOSPITAL_COMMUNITY): Payer: Self-pay

## 2015-01-23 DIAGNOSIS — R112 Nausea with vomiting, unspecified: Secondary | ICD-10-CM | POA: Insufficient documentation

## 2015-01-23 DIAGNOSIS — Z9889 Other specified postprocedural states: Secondary | ICD-10-CM | POA: Insufficient documentation

## 2015-01-23 DIAGNOSIS — R109 Unspecified abdominal pain: Secondary | ICD-10-CM | POA: Diagnosis present

## 2015-01-23 DIAGNOSIS — Z862 Personal history of diseases of the blood and blood-forming organs and certain disorders involving the immune mechanism: Secondary | ICD-10-CM | POA: Insufficient documentation

## 2015-01-23 DIAGNOSIS — E7800 Pure hypercholesterolemia, unspecified: Secondary | ICD-10-CM | POA: Diagnosis not present

## 2015-01-23 DIAGNOSIS — K219 Gastro-esophageal reflux disease without esophagitis: Secondary | ICD-10-CM | POA: Insufficient documentation

## 2015-01-23 DIAGNOSIS — N183 Chronic kidney disease, stage 3 (moderate): Secondary | ICD-10-CM | POA: Insufficient documentation

## 2015-01-23 DIAGNOSIS — Z7982 Long term (current) use of aspirin: Secondary | ICD-10-CM | POA: Diagnosis not present

## 2015-01-23 DIAGNOSIS — Z87442 Personal history of urinary calculi: Secondary | ICD-10-CM | POA: Insufficient documentation

## 2015-01-23 DIAGNOSIS — E119 Type 2 diabetes mellitus without complications: Secondary | ICD-10-CM | POA: Insufficient documentation

## 2015-01-23 DIAGNOSIS — Z792 Long term (current) use of antibiotics: Secondary | ICD-10-CM | POA: Diagnosis not present

## 2015-01-23 DIAGNOSIS — Z79899 Other long term (current) drug therapy: Secondary | ICD-10-CM | POA: Diagnosis not present

## 2015-01-23 DIAGNOSIS — I129 Hypertensive chronic kidney disease with stage 1 through stage 4 chronic kidney disease, or unspecified chronic kidney disease: Secondary | ICD-10-CM | POA: Insufficient documentation

## 2015-01-23 LAB — URINALYSIS, ROUTINE W REFLEX MICROSCOPIC
BILIRUBIN URINE: NEGATIVE
GLUCOSE, UA: NEGATIVE mg/dL
KETONES UR: NEGATIVE mg/dL
Leukocytes, UA: NEGATIVE
Nitrite: NEGATIVE
PROTEIN: 100 mg/dL — AB
Specific Gravity, Urine: 1.008 (ref 1.005–1.030)
pH: 5.5 (ref 5.0–8.0)

## 2015-01-23 LAB — COMPREHENSIVE METABOLIC PANEL
ALBUMIN: 2.6 g/dL — AB (ref 3.5–5.0)
ALT: 14 U/L (ref 14–54)
ANION GAP: 12 (ref 5–15)
AST: 16 U/L (ref 15–41)
Alkaline Phosphatase: 37 U/L — ABNORMAL LOW (ref 38–126)
BUN: 74 mg/dL — ABNORMAL HIGH (ref 6–20)
CHLORIDE: 102 mmol/L (ref 101–111)
CO2: 22 mmol/L (ref 22–32)
CREATININE: 2.94 mg/dL — AB (ref 0.44–1.00)
Calcium: 8.9 mg/dL (ref 8.9–10.3)
GFR calc non Af Amer: 19 mL/min — ABNORMAL LOW (ref >60)
GFR, EST AFRICAN AMERICAN: 22 mL/min — AB (ref >60)
GLUCOSE: 109 mg/dL — AB (ref 65–99)
Potassium: 4.2 mmol/L (ref 3.5–5.1)
SODIUM: 136 mmol/L (ref 135–145)
Total Bilirubin: <0.1 mg/dL — ABNORMAL LOW (ref 0.3–1.2)
Total Protein: 6.3 g/dL — ABNORMAL LOW (ref 6.5–8.1)

## 2015-01-23 LAB — CBC
HCT: 28.1 % — ABNORMAL LOW (ref 36.0–46.0)
Hemoglobin: 8.9 g/dL — ABNORMAL LOW (ref 12.0–15.0)
MCH: 24.6 pg — AB (ref 26.0–34.0)
MCHC: 31.7 g/dL (ref 30.0–36.0)
MCV: 77.6 fL — AB (ref 78.0–100.0)
PLATELETS: 370 10*3/uL (ref 150–400)
RBC: 3.62 MIL/uL — AB (ref 3.87–5.11)
RDW: 14.3 % (ref 11.5–15.5)
WBC: 8.2 10*3/uL (ref 4.0–10.5)

## 2015-01-23 LAB — LIPASE, BLOOD: LIPASE: 39 U/L (ref 11–51)

## 2015-01-23 LAB — URINE MICROSCOPIC-ADD ON

## 2015-01-23 MED ORDER — PROMETHAZINE HCL 25 MG PO TABS
25.0000 mg | ORAL_TABLET | ORAL | Status: AC
  Administered 2015-01-23: 25 mg via ORAL
  Filled 2015-01-23: qty 1

## 2015-01-23 MED ORDER — ONDANSETRON 4 MG PO TBDP
4.0000 mg | ORAL_TABLET | Freq: Once | ORAL | Status: AC
  Administered 2015-01-23: 4 mg via ORAL
  Filled 2015-01-23: qty 1

## 2015-01-23 MED ORDER — PROMETHAZINE HCL 25 MG PO TABS: 25.0000 mg | ORAL_TABLET | Freq: Four times a day (QID) | ORAL | Status: DC | PRN

## 2015-01-23 MED ORDER — OXYCODONE-ACETAMINOPHEN 5-325 MG PO TABS
2.0000 | ORAL_TABLET | Freq: Once | ORAL | Status: AC
  Administered 2015-01-23: 2 via ORAL
  Filled 2015-01-23: qty 2

## 2015-01-23 MED ORDER — OXYCODONE-ACETAMINOPHEN 5-325 MG PO TABS: 1.0000 | ORAL_TABLET | Freq: Four times a day (QID) | ORAL | Status: DC | PRN

## 2015-01-23 NOTE — ED Notes (Signed)
Pt was here earlier last night and let go home but since then has been having right upper abd pain and vomting, and real tired, no diarrhea.

## 2015-01-23 NOTE — Discharge Instructions (Signed)
Your lab work and imaging showed no abnormalities. Your back pain appears to be from a muscle spasm in your mid back.  You may take these medications as directed to treat your pain and nausea.   SEEK IMMEDIATE MEDICAL ATTENTION IF: The pain does not go away or becomes severe.  A temperature above 101 develops.  Repeated vomiting occurs (multiple episodes).  The pain becomes localized to portions of the abdomen. The right side could possibly be appendicitis. In an adult, the left lower portion of the abdomen could be colitis or diverticulitis.  Blood is being passed in stools or vomit (bright red or black tarry stools).  Return also if you develop chest pain, difficulty breathing, dizziness or fainting, or become confused, poorly responsive, or inconsolable (young children).  SEEK IMMEDIATE MEDICAL ATTENTION IF: New numbness, tingling, weakness, or problem with the use of your arms or legs.  Severe back pain not relieved with medications.  Change in bowel or bladder control.  Increasing pain in any areas of the body (such as chest or abdominal pain).  Shortness of breath, dizziness or fainting.  Nausea (feeling sick to your stomach), vomiting, fever, or sweats.   Flank Pain Flank pain refers to pain that is located on the side of the body between the upper abdomen and the back. The pain may occur over a short period of time (acute) or may be long-term or reoccurring (chronic). It may be mild or severe. Flank pain can be caused by many things. CAUSES  Some of the more common causes of flank pain include:  Muscle strains.   Muscle spasms.   A disease of your spine (vertebral disk disease).   A lung infection (pneumonia).   Fluid around your lungs (pulmonary edema).   A kidney infection.   Kidney stones.   A very painful skin rash caused by the chickenpox virus (shingles).   Gallbladder disease.  [place] care will depend on the cause of your  pain. In general,  Rest as directed by your caregiver.  Drink enough fluids to keep your urine clear or pale yellow.  Only take over-the-counter or prescription medicines as directed by your caregiver. Some medicines may help relieve the pain.  Tell your caregiver about any changes in your pain.  Follow up with your caregiver as directed. SEEK IMMEDIATE MEDICAL CARE IF:   Your pain is not controlled with medicine.   You have new or worsening symptoms.  Your pain increases.   You have abdominal pain.   You have shortness of breath.   You have persistent nausea or vomiting.   You have swelling in your abdomen.   You feel faint or pass out.   You have blood in your urine.  You have a fever or persistent symptoms for more than 2-3 days.  You have a fever and your symptoms suddenly get worse. MAKE SURE YOU:   Understand these instructions.  Will watch your condition.  Will get help right away if you are not doing well or get worse.   This information is not intended to replace advice given to you by your health care provider. Make sure you discuss any questions you have with your health care provider.   Document Released: 02/12/2005 Document Revised: 09/16/2011 Document Reviewed: 08/06/2011 Elsevier Interactive Patient Education [employer].

## 2015-01-23 NOTE — ED Provider Notes (Signed)
CSN: [id number]     Arrival date & time 01/23/15  0443 History   First MD Initiated Contact with Patient 01/23/15 [phone]     Chief Complaint  Patient presents with  . Abdominal Pain  . Fatigue     (Consider location/radiation/quality/duration/timing/severity/associated sxs/prior Treatment) HPI  Patricia [name] is a(n) 40 y.o. female who presents to the ED w/ cc of R flank pain. She  has a past medical history of Hypertension; Acid reflux; High cholesterol; History of blood transfusion ("a couple"); Headache(784.0); Type II diabetes mellitus ([place]); Iron deficiency anemia; Chronic kidney disease (CKD), stage III (moderate); and MCGN (minimal change glomerulonephritis). She is followed at [facility] for her kidney disease. Patient states that yesterday she was feeling nauseated all day and did not eat. She ate rice and a small pork chops for dinner. She states that she was unable to get to sleep because of pain in her right flank. She states she was unable to find a comfortable position. She denies any urinary symptoms, fever, chills, history of kidney stone. She states around 4:00 in the morning the pain became severe, sharp, stabbing, radiating to the right rib cage. She got hot and nauseated and vomited twice. She denies pleuritic chest pain, cough, hemoptysis, history of blood clots or DVT. The patient denies chest pain or shortness of breath, abdominal pain.   Past Medical History  Diagnosis Date  . Hypertension   . Acid reflux   . High cholesterol   . History of blood transfusion "a couple"    "related to low counts"  . Headache(784.0)     "related to chemo; sometimes weekly" (09/12/2013)  . Type II diabetes mellitus ([place])     "took me off my RX ~ 04/2013"  . Iron deficiency anemia     "get epogen shots q month" (02/20/2014)  . Chronic kidney disease (CKD), stage III (moderate)   . MCGN (minimal change glomerulonephritis)     "using chemo to tx;  finished my last tx in 12/2013"   Past Surgical  History  Procedure Laterality Date  . Ankle fracture surgery Right 1994  . Abdominal hysterectomy  2010    "laparoscopic"  . Fracture surgery    . Cardiac catheterization  2000's   Family History  Problem Relation Age of Onset  . Hypertension Mother   . Thyroid disease Mother   . Coronary artery disease Father   . Hypertension Father   . Diabetes Father    Social History  Substance Use Topics  . Smoking status: Never Smoker   . Smokeless tobacco: Never Used  . Alcohol Use: No   OB History    Gravida Para Term Preterm AB TAB SAB Ectopic Multiple Living   3        3      Review of Systems  Ten systems reviewed and are negative for acute change, except as noted in the HPI.    Allergies  Nsaids  Home Medications   Prior to Admission medications   Medication Sig Start Date End Date Taking? Authorizing Provider  amLODipine (NORVASC) 5 MG tablet Take 5 mg by mouth daily. 08/12/14   Historical Provider, MD  aspirin 81 MG chewable tablet Chew 1 tablet (81 mg total) by mouth daily. 10/03/13   Eugenie Filler, MD  atorvastatin (LIPITOR) 40 MG tablet Take 40 mg by mouth daily. 03/14/14   Historical Provider, MD  azithromycin (ZITHROMAX) 250 MG tablet Take 250 mg by mouth daily.    Historical  Provider, MD  calcium acetate (PHOSLO) 667 MG tablet Take 1,334 mg by mouth 3 (three) times daily. 08/02/14 08/02/15  Historical Provider, MD  Cholecalciferol (VITAMIN D-1000 MAX ST) 1000 UNITS tablet Take 1,000 Units by mouth daily. 08/02/14 08/02/15  Historical Provider, MD  colchicine 0.6 MG tablet Take 0.6 mg by mouth every other day. 10/30/14 10/30/15  Historical Provider, MD  diphenhydramine-acetaminophen (TYLENOL PM) 25-500 MG TABS tablet Take 2 tablets by mouth at bedtime as needed. For sleep    Historical Provider, MD  lidocaine (LIDODERM) 5 % Place 1 patch onto the skin daily. Remove & Discard patch within 12 hours or as directed by MD Patient not taking: Reported on 01/13/2015 08/21/14    April Palumbo, MD  lisinopril (PRINIVIL,ZESTRIL) 10 MG tablet Take 10 mg by mouth daily.    Historical Provider, MD  methocarbamol (ROBAXIN) 500 MG tablet Take 1 tablet (500 mg total) by mouth 2 (two) times daily. Patient not taking: Reported on 01/13/2015 08/21/14   April Palumbo, MD  metoCLOPramide (REGLAN) 10 MG tablet Take 1 tablet (10 mg total) by mouth every 6 (six) hours. Patient taking differently: Take 10 mg by mouth every 6 (six) hours as needed for nausea.  09/20/14   Tatyana Kirichenko, PA-C  metolazone (ZAROXOLYN) 2.5 MG tablet Take 1 tablet (2.5 mg total) by mouth daily as needed (for leg swellings or shortness of breath). 04/12/14   Nishant Dhungel, MD  metoprolol tartrate (LOPRESSOR) 25 MG tablet Take 1 tablet (25 mg total) by mouth 2 (two) times daily. 10/03/13   Eugenie Filler, MD  Multiple Vitamin (MULTIVITAMIN WITH MINERALS) TABS tablet Take 1 tablet by mouth daily.    Historical Provider, MD  omeprazole (PRILOSEC) 40 MG capsule Take 40 mg by mouth daily.      Historical Provider, MD  potassium chloride (K-DUR) 10 MEQ tablet Take 10 mEq by mouth daily. 08/12/14   Historical Provider, MD  promethazine (PHENERGAN) 25 MG tablet Take 25 mg by mouth every 6 (six) hours as needed for nausea or vomiting.    Historical Provider, MD  SUMAtriptan (IMITREX) 50 MG tablet Take 50 mg by mouth 2 (two) times daily as needed for migraine. May repeat in 2 hours if headache persists or recurs.    Historical Provider, MD  torsemide (DEMADEX) 20 MG tablet Take 2 tablets (40 mg total) by mouth daily. Patient taking differently: Take 80 mg by mouth daily.  04/12/14   Nishant Dhungel, MD  traMADol (ULTRAM) 50 MG tablet Take 1 tablet (50 mg total) by mouth every 6 (six) hours as needed. Patient taking differently: Take 50 mg by mouth every 6 (six) hours as needed for moderate pain.  09/20/14   Tatyana Kirichenko, PA-C  traZODone (DESYREL) 50 MG tablet Take 50 mg by mouth at bedtime as needed for sleep.      Historical Provider, MD   BP 122/70 mmHg  Pulse 60  Temp(Src) 98.9 F (37.2 C) (Oral)  Resp 16  Ht $R'5\' 7"'SJ$  (1.702 m)  Wt 103.874 kg  BMI 35.86 kg/m2  SpO2 100% Physical Exam  Constitutional: She is oriented to person, place, and time. She appears well-developed and well-nourished. No distress.  HENT:  Head: Normocephalic and atraumatic.  Eyes: Conjunctivae are normal. No scleral icterus.  Neck: Normal range of motion.  Cardiovascular: Normal rate, regular rhythm and normal heart sounds.  Exam reveals no gallop and no friction rub.   No murmur heard. Pulmonary/Chest: Effort normal and breath sounds normal. No respiratory  distress.  Abdominal: Soft. Normal appearance and bowel sounds are normal. She exhibits no distension and no mass. There is no tenderness. There is no rigidity, no rebound, no guarding and no CVA tenderness.  Musculoskeletal:       Arms: Neurological: She is alert and oriented to person, place, and time.  Skin: Skin is warm and dry. She is not diaphoretic.  Nursing note and vitals reviewed.   ED Course  Procedures (including critical care time) Labs Review Labs Reviewed  COMPREHENSIVE METABOLIC PANEL - Abnormal; Notable for the following:    Glucose, Bld 109 (*)    BUN 74 (*)    Creatinine, Ser 2.94 (*)    Total Protein 6.3 (*)    Albumin 2.6 (*)    Alkaline Phosphatase 37 (*)    Total Bilirubin <0.1 (*)    GFR calc non Af Amer 19 (*)    GFR calc Af Amer 22 (*)    All other components within normal limits  CBC - Abnormal; Notable for the following:    RBC 3.62 (*)    Hemoglobin 8.9 (*)    HCT 28.1 (*)    MCV 77.6 (*)    MCH 24.6 (*)    All other components within normal limits  URINALYSIS, ROUTINE W REFLEX MICROSCOPIC (NOT AT [facility]) - Abnormal; Notable for the following:    Color, Urine STRAW (*)    Hgb urine dipstick TRACE (*)    Protein, ur 100 (*)    All other components within normal limits  URINE MICROSCOPIC-ADD ON - Abnormal; Notable for the  following:    Squamous Epithelial / LPF 6-30 (*)    Bacteria, UA MANY (*)    All other components within normal limits  LIPASE, BLOOD    Imaging Review Ct Abdomen Pelvis Wo Contrast  01/23/2015  CLINICAL DATA:  Acute onset right flank pain yesterday. nausea and vomiting. Glomerulonephritis. EXAM: CT ABDOMEN AND PELVIS WITHOUT CONTRAST TECHNIQUE: Multidetector CT imaging of the abdomen and pelvis was performed following the standard protocol without IV contrast. COMPARISON:  09/19/2014 FINDINGS: Lower chest:  No acute findings. Hepatobiliary: No mass visualized on this un-enhanced exam. Gallbladder is unremarkable. Pancreas: No mass or inflammatory process identified on this un-enhanced exam. Spleen: Within normal limits in size. Adrenals/Urinary Tract: No evidence of urolithiasis or hydronephrosis. No definite mass visualized on this un-enhanced exam. Stomach/Bowel: No evidence of obstruction, inflammatory process, or abnormal fluid collections. Normal appendix visualized. Mild left colonic diverticulosis seen, however there is no evidence diverticulitis. Vascular/Lymphatic: No pathologically enlarged lymph nodes. No evidence of abdominal aortic aneurysm. Reproductive: Prior hysterectomy again noted. 2.9 cm right ovarian cyst is decreased in size since previous study, consistent with a functional ovarian cyst in a reproductive age female. No evidence free fluid. Other: A small periumbilical ventral hernia is again seen containing only fat. No evidence herniated bowel loops. Musculoskeletal:  No suspicious bone lesions identified. IMPRESSION: Colonic diverticulosis. No radiographic evidence of diverticulitis or other acute findings. Stable small paraumbilical ventral hernia containing only fat. Electronically Signed   By: Earle Gell M.D.   On: 01/23/2015 07:54   I have personally reviewed and evaluated these images and lab results as part of my medical decision-making.   EKG Interpretation None       MDM   Final diagnoses:  Acute right flank pain    7:01 AM  BP 122/70 mmHg  Pulse 60  Temp(Src) 98.9 F (37.2 C) (Oral)  Resp 16  Ht $R'5\' 7"'HN$  (  1.702 m)  Wt 103.874 kg  BMI 35.86 kg/m2  SpO2 100% Patient with flank pain.  Given Her physical exam and lab findings I suspect the pain is musculoskeletal in nature. Will check a ct ab/pelv w/o for possible kedney stone given the patient's  History.  8:56 AM Pain and nausea improved. No acute abnormalities on CT. Lab work improved from 4 days ago. Will d//c with symptomatic treatment. Benign abdominal exam. UA negative Discussed return precautions. Appears safe for discharge at this time.    Margarita Mail, PA-C 01/23/15 [id number]  Merryl Hacker, MD 01/23/15 2249

## 2015-03-09 ENCOUNTER — Emergency Department (HOSPITAL_COMMUNITY)
Admission: EM | Admit: 2015-03-09 | Discharge: 2015-03-09 | Disposition: A | Discharge status: Home / Self Care | Payer: Medicaid Other | Attending: Emergency Medicine | Admitting: Emergency Medicine

## 2015-03-09 ENCOUNTER — Encounter (HOSPITAL_COMMUNITY): Payer: Self-pay | Admitting: Family Medicine

## 2015-03-09 ENCOUNTER — Emergency Department (HOSPITAL_COMMUNITY): Payer: Medicaid Other

## 2015-03-09 DIAGNOSIS — Z79899 Other long term (current) drug therapy: Secondary | ICD-10-CM | POA: Diagnosis not present

## 2015-03-09 DIAGNOSIS — Z7982 Long term (current) use of aspirin: Secondary | ICD-10-CM | POA: Insufficient documentation

## 2015-03-09 DIAGNOSIS — I129 Hypertensive chronic kidney disease with stage 1 through stage 4 chronic kidney disease, or unspecified chronic kidney disease: Secondary | ICD-10-CM | POA: Insufficient documentation

## 2015-03-09 DIAGNOSIS — Z862 Personal history of diseases of the blood and blood-forming organs and certain disorders involving the immune mechanism: Secondary | ICD-10-CM | POA: Diagnosis not present

## 2015-03-09 DIAGNOSIS — E7800 Pure hypercholesterolemia, unspecified: Secondary | ICD-10-CM | POA: Insufficient documentation

## 2015-03-09 DIAGNOSIS — K219 Gastro-esophageal reflux disease without esophagitis: Secondary | ICD-10-CM | POA: Insufficient documentation

## 2015-03-09 DIAGNOSIS — N183 Chronic kidney disease, stage 3 (moderate): Secondary | ICD-10-CM | POA: Insufficient documentation

## 2015-03-09 DIAGNOSIS — R079 Chest pain, unspecified: Secondary | ICD-10-CM

## 2015-03-09 DIAGNOSIS — Z792 Long term (current) use of antibiotics: Secondary | ICD-10-CM | POA: Diagnosis not present

## 2015-03-09 DIAGNOSIS — E119 Type 2 diabetes mellitus without complications: Secondary | ICD-10-CM | POA: Diagnosis not present

## 2015-03-09 LAB — CBC
HCT: 27 % — ABNORMAL LOW (ref 36.0–46.0)
Hemoglobin: 8.3 g/dL — ABNORMAL LOW (ref 12.0–15.0)
MCH: 24.1 pg — ABNORMAL LOW (ref 26.0–34.0)
MCHC: 30.7 g/dL (ref 30.0–36.0)
MCV: 78.3 fL (ref 78.0–100.0)
Platelets: 398 K/uL (ref 150–400)
RBC: 3.45 MIL/uL — ABNORMAL LOW (ref 3.87–5.11)
RDW: 15 % (ref 11.5–15.5)
WBC: 6.8 K/uL (ref 4.0–10.5)

## 2015-03-09 LAB — BASIC METABOLIC PANEL
Anion gap: 11 (ref 5–15)
BUN: 69 mg/dL — ABNORMAL HIGH (ref 6–20)
CHLORIDE: 106 mmol/L (ref 101–111)
CO2: 24 mmol/L (ref 22–32)
CREATININE: 3.43 mg/dL — AB (ref 0.44–1.00)
Calcium: 8 mg/dL — ABNORMAL LOW (ref 8.9–10.3)
GFR calc non Af Amer: 16 mL/min — ABNORMAL LOW (ref >60)
GFR, EST AFRICAN AMERICAN: 18 mL/min — AB (ref >60)
Glucose, Bld: 130 mg/dL — ABNORMAL HIGH (ref 65–99)
POTASSIUM: 4.5 mmol/L (ref 3.5–5.1)
Sodium: 141 mmol/L (ref 135–145)

## 2015-03-09 LAB — I-STAT TROPONIN, ED: Troponin i, poc: 0 ng/mL (ref 0.00–0.08)

## 2015-03-09 MED ORDER — ASPIRIN 81 MG PO CHEW
324.0000 mg | CHEWABLE_TABLET | Freq: Once | ORAL | Status: AC
  Administered 2015-03-09: 324 mg via ORAL
  Filled 2015-03-09: qty 4

## 2015-03-09 MED ORDER — NITROGLYCERIN 0.4 MG SL SUBL
0.4000 mg | SUBLINGUAL_TABLET | SUBLINGUAL | Status: DC | PRN
  Administered 2015-03-09: 0.4 mg via SUBLINGUAL
  Filled 2015-03-09: qty 1

## 2015-03-09 NOTE — ED Notes (Signed)
Pt refused 2nd NTG, PA made aware

## 2015-03-09 NOTE — ED Provider Notes (Signed)
CSN: [id number]     Arrival date & time 03/09/15  1023 History   First MD Initiated Contact with Patient 03/09/15 1043     Chief Complaint  Patient presents with  . Chest Pain   HPI   40 year old female arrives today with complaints of chest pain. Patient reports symptoms started approximately 4 AM was sleeping, reporting she felt hot nauseated with abdominal pain and then chest pain. She describes it as chest pressure with radiation to her back and left shoulder. She denies any aggravating factors, reports that this is been constant since onset with no improvement or worsening. Patient denies any diaphoresis, dyspnea, syncope, palpitations, fever, peripheral edema, cough or hemoptysis. Patient reports she has not tried any medications prior to arrival. Patient reports this is similar to previous episodes of chest pain for which she has been seen in the ED numerous 4. She reports she has sublingual nitroglycerin tabs at home but states she was unable to find them. Pt reports symptoms are similar to GERD related symptom but states this is slightly different.   When asked the patient had been seen by a cardiologist previously she denied. Upon chart review patient has had significant cardiac workup by numerous cardiologists with numerous echoes and stress test. Patient reports that she forgot about these.   Cardiac History Cardiologist: Dr. Sallyanne Kuster, Most recently seen by Dr. Mare Ferrari as consult on 02/20/2014  MI: no history of  Studies: Troponin negative , Echo February 2016 normal , stress test, event monitor Surgeries, interventions: none Cardiac anatomy: normal   Risk Factors:  hypertension, high cholesterol, diabetes   Pulmonary History:  Smoking: non smoker Asthma/ COPD/ Bronchitis: none PE Risk factors: Perc Negative     Past Medical History  Diagnosis Date  . Hypertension   . Acid reflux   . High cholesterol   . History of blood transfusion "a couple"    "related to low  counts"  . Headache(784.0)     "related to chemo; sometimes weekly" (09/12/2013)  . Type II diabetes mellitus ([place])     "took me off my RX ~ 04/2013"  . Iron deficiency anemia     "get epogen shots q month" (02/20/2014)  . Chronic kidney disease (CKD), stage III (moderate)   . 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 Ankle fracture surgery Right 1994  . Abdominal hysterectomy  2010    "laparoscopic"  . Fracture surgery    . Cardiac catheterization  2000's   Family History  Problem Relation Age of Onset  . Hypertension Mother   . Thyroid disease Mother   . Coronary artery disease Father   . Hypertension Father   . Diabetes Father    Social History  Substance Use Topics  . Smoking status: Never Smoker   . Smokeless tobacco: Never Used  . Alcohol Use: No   OB History    Gravida Para Term Preterm AB TAB SAB Ectopic Multiple Living   3        3      Review of Systems  All other systems reviewed and are negative.   Allergies  Nsaids  Home Medications   Prior to Admission medications   Medication Sig Start Date End Date Taking? Authorizing Provider  amLODipine (NORVASC) 5 MG tablet Take 5 mg by mouth daily. 08/12/14  Yes Historical Provider, MD  aspirin 81  MG chewable tablet Chew 1 tablet (81 mg total) by mouth daily. 10/03/13  Yes Eugenie Filler, MD  atorvastatin (LIPITOR) 40 MG tablet Take 40 mg by mouth daily. 03/14/14  Yes Historical Provider, MD  azithromycin (ZITHROMAX) 250 MG tablet Take 250 mg by mouth daily.   Yes Historical Provider, MD  calcium acetate (PHOSLO) 667 MG tablet Take 1,334 mg by mouth 3 (three) times daily. 08/02/14 08/02/15 Yes Historical Provider, MD  Cholecalciferol (VITAMIN D-1000 MAX ST) 1000 UNITS tablet Take 1,000 Units by mouth daily. 08/02/14 08/02/15 Yes Historical Provider, MD  diphenhydramine-acetaminophen (TYLENOL PM) 25-500 MG TABS  tablet Take 2 tablets by mouth at bedtime as needed. For sleep   Yes Historical Provider, MD  lisinopril (PRINIVIL,ZESTRIL) 10 MG tablet Take 10 mg by mouth daily.   Yes Historical Provider, MD  metolazone (ZAROXOLYN) 2.5 MG tablet Take 1 tablet (2.5 mg total) by mouth daily as needed (for leg swellings or shortness of breath). 04/12/14  Yes Nishant Dhungel, MD  metoprolol tartrate (LOPRESSOR) 25 MG tablet Take 1 tablet (25 mg total) by mouth 2 (two) times daily. 10/03/13  Yes Eugenie Filler, MD  Multiple Vitamin (MULTIVITAMIN WITH MINERALS) TABS tablet Take 1 tablet by mouth daily.   Yes Historical Provider, MD  omeprazole (PRILOSEC) 40 MG capsule Take 40 mg by mouth daily.     Yes Historical Provider, MD  oxyCODONE-acetaminophen (PERCOCET/ROXICET) 5-325 MG tablet Take 1-2 tablets by mouth every 6 (six) hours as needed for severe pain. 01/23/15  Yes Abigail Harris, PA-C  potassium chloride (K-DUR) 10 MEQ tablet Take 10 mEq by mouth daily. 08/12/14  Yes Historical Provider, MD  promethazine (PHENERGAN) 25 MG tablet Take 1 tablet (25 mg total) by mouth every 6 (six) hours as needed for nausea or vomiting. 01/23/15  Yes Margarita Mail, PA-C  SUMAtriptan (IMITREX) 50 MG tablet Take 50 mg by mouth 2 (two) times daily as needed for migraine. May repeat in 2 hours if headache persists or recurs.   Yes Historical Provider, MD  torsemide (DEMADEX) 20 MG tablet Take 2 tablets (40 mg total) by mouth daily. Patient taking differently: Take 80 mg by mouth daily.  04/12/14  Yes Nishant Dhungel, MD  traMADol (ULTRAM) 50 MG tablet Take 1 tablet (50 mg total) by mouth every 6 (six) hours as needed. Patient taking differently: Take 50 mg by mouth every 6 (six) hours as needed for moderate pain.  09/20/14  Yes Tatyana Kirichenko, PA-C  traZODone (DESYREL) 50 MG tablet Take 50 mg by mouth at bedtime as needed for sleep.    Yes Historical Provider, MD  colchicine 0.6 MG tablet Take 0.6 mg by mouth every other day. 10/30/14  10/30/15  Historical Provider, MD  metoCLOPramide (REGLAN) 10 MG tablet Take 1 tablet (10 mg total) by mouth every 6 (six) hours. Patient taking differently: Take 10 mg by mouth every 6 (six) hours as needed for nausea.  09/20/14   Tatyana Kirichenko, PA-C   BP 124/74 mmHg  Pulse 65  Temp(Src) 98 F (36.7 C) (Oral)  Resp 22  SpO2 100%   Physical Exam  Constitutional: She is oriented to person, place, and time. She appears well-developed and well-nourished.  HENT:  Head: Normocephalic and atraumatic.  Eyes: Conjunctivae are normal. Pupils are equal, round, and reactive to light. Right eye exhibits no discharge. Left eye exhibits no discharge. No scleral icterus.  Neck: Normal range of motion. No JVD present. No tracheal deviation present.  Cardiovascular: Normal rate, regular rhythm,  normal heart sounds and intact distal pulses.  Exam reveals no gallop and no friction rub.   No murmur heard. Pulmonary/Chest: Effort normal and breath sounds normal. No stridor. No respiratory distress. She has no wheezes. She has no rales. She exhibits no tenderness.  Abdominal: Soft. She exhibits no distension and no mass. There is no tenderness. There is no rebound and no guarding.  Musculoskeletal: Normal range of motion. She exhibits no edema.  Neurological: She is alert and oriented to person, place, and time. Coordination normal.  Skin: Skin is warm and dry. No rash noted. No erythema. No pallor.  Psychiatric: She has a normal mood and affect. Her behavior is normal. Judgment and thought content normal.  Nursing note and vitals reviewed.   ED Course  Procedures (including critical care time) Labs Review Labs Reviewed  BASIC METABOLIC PANEL - Abnormal; Notable for the following:    Glucose, Bld 130 (*)    BUN 69 (*)    Creatinine, Ser 3.43 (*)    Calcium 8.0 (*)    GFR calc non Af Amer 16 (*)    GFR calc Af Amer 18 (*)    All other components within normal limits  CBC - Abnormal; Notable for  the following:    RBC 3.45 (*)    Hemoglobin 8.3 (*)    HCT 27.0 (*)    MCH 24.1 (*)    All other components within normal limits  I-STAT TROPOININ, ED    Imaging Review Dg Chest 2 View  03/09/2015  CLINICAL DATA:  LEFT side chest pain radiating to back since 0400 hours, nausea, vomiting, hypertension, type II diabetes mellitus, stage III chronic kidney disease, initial encounter EXAM: CHEST  2 VIEW COMPARISON:  01/13/2015 FINDINGS: Upper normal heart size. Mediastinal contours and pulmonary vascularity normal. Lungs clear. No pleural effusion or pneumothorax. Bones unremarkable. IMPRESSION: No acute abnormalities. Electronically Signed   By: Lavonia Dana M.D.   On: 03/09/2015 11:48   I have personally reviewed and evaluated these images and lab results as part of my medical decision-making.   EKG Interpretation   Date/Time:  Saturday March 09 2015 10:31:05 EST Ventricular Rate:  66 PR Interval:  150 QRS Duration: 84 QT Interval:  430 QTC Calculation: 450 R Axis:   23 Text Interpretation:  Normal sinus rhythm Cannot rule out Anterior infarct  , age undetermined Abnormal ECG Confirmed by Wilson Singer  MD, STEPHEN ([id number]) on  03/09/2015 12:34:39 PM      MDM   Final diagnoses:  Chest pain, unspecified chest pain type    Labs: I-STAT troponin, BMP , CBC  Imaging: DG chest 2 view  Consults:  Therapeutics: Aspirin, nitroglycerin  Discharge Meds:   Assessment/Plan: 40 year old female presents today with chest pain. Patient has a history of the same, seen numerous times with significant cardiac workup including echo, stress test, event monitor with no significant findings.  Patient's pain is typical of previous, patient also reports some gastric chest pain that has presented in a similar nature as well. Patient has no significant findings on workup today that raise concern for ACS. She is a heart score of 2, is perk negative. Patient was in no acute distress throughout my evaluation, on  her phone when she TV throughout interaction. Patient given medications here in the ED with some symptomatic improvement. Patient is afebrile, nontoxic, she will be discharged home with instructions to follow-up with her cardiologist on Monday for repeat evaluation and further management. She is instructed to  return immediately to the emergency room if she has any worsening signs or symptoms. Patient verbalized understanding and agreement for today's plan and had no further questions or concerns at time of discharge.         Okey Regal, PA-C 03/10/15 2683  Virgel Manifold, MD 03/10/15 [phone]

## 2015-03-09 NOTE — ED Notes (Signed)
Pt here for chest pain, SOB, nausea since 4 am. sts middles of chest into left shoulder.

## 2015-04-01 ENCOUNTER — Emergency Department (HOSPITAL_COMMUNITY)
Admission: EM | Admit: 2015-04-01 | Discharge: 2015-04-02 | Disposition: A | Discharge status: Home / Self Care | Payer: Medicaid Other | Attending: Emergency Medicine | Admitting: Emergency Medicine

## 2015-04-01 ENCOUNTER — Encounter (HOSPITAL_COMMUNITY): Payer: Self-pay | Admitting: Emergency Medicine

## 2015-04-01 DIAGNOSIS — I129 Hypertensive chronic kidney disease with stage 1 through stage 4 chronic kidney disease, or unspecified chronic kidney disease: Secondary | ICD-10-CM | POA: Diagnosis not present

## 2015-04-01 DIAGNOSIS — N183 Chronic kidney disease, stage 3 (moderate): Secondary | ICD-10-CM | POA: Insufficient documentation

## 2015-04-01 DIAGNOSIS — E119 Type 2 diabetes mellitus without complications: Secondary | ICD-10-CM | POA: Insufficient documentation

## 2015-04-01 DIAGNOSIS — Z862 Personal history of diseases of the blood and blood-forming organs and certain disorders involving the immune mechanism: Secondary | ICD-10-CM | POA: Diagnosis not present

## 2015-04-01 DIAGNOSIS — R0789 Other chest pain: Secondary | ICD-10-CM | POA: Insufficient documentation

## 2015-04-01 DIAGNOSIS — E7800 Pure hypercholesterolemia, unspecified: Secondary | ICD-10-CM | POA: Insufficient documentation

## 2015-04-01 DIAGNOSIS — R079 Chest pain, unspecified: Secondary | ICD-10-CM | POA: Diagnosis present

## 2015-04-01 DIAGNOSIS — Z79899 Other long term (current) drug therapy: Secondary | ICD-10-CM | POA: Insufficient documentation

## 2015-04-01 DIAGNOSIS — K219 Gastro-esophageal reflux disease without esophagitis: Secondary | ICD-10-CM | POA: Diagnosis not present

## 2015-04-01 DIAGNOSIS — Z7982 Long term (current) use of aspirin: Secondary | ICD-10-CM | POA: Insufficient documentation

## 2015-04-01 DIAGNOSIS — Z9889 Other specified postprocedural states: Secondary | ICD-10-CM | POA: Diagnosis not present

## 2015-04-01 NOTE — ED Notes (Signed)
Patient presents for left sided CP radiating to left arm x1 day, nausea, reports SOB and diaphoresis earlier. Denies lightheadedness, dizziness, vomiting.

## 2015-04-02 ENCOUNTER — Emergency Department (HOSPITAL_COMMUNITY): Payer: Medicaid Other

## 2015-04-02 LAB — BASIC METABOLIC PANEL
Anion gap: 11 (ref 5–15)
BUN: 66 mg/dL — AB (ref 6–20)
CALCIUM: 8.8 mg/dL — AB (ref 8.9–10.3)
CHLORIDE: 109 mmol/L (ref 101–111)
CO2: 20 mmol/L — ABNORMAL LOW (ref 22–32)
CREATININE: 2.85 mg/dL — AB (ref 0.44–1.00)
GFR, EST AFRICAN AMERICAN: 23 mL/min — AB (ref >60)
GFR, EST NON AFRICAN AMERICAN: 19 mL/min — AB (ref >60)
Glucose, Bld: 107 mg/dL — ABNORMAL HIGH (ref 65–99)
Potassium: 4.2 mmol/L (ref 3.5–5.1)
SODIUM: 140 mmol/L (ref 135–145)

## 2015-04-02 LAB — I-STAT TROPONIN, ED
TROPONIN I, POC: 0 ng/mL (ref 0.00–0.08)
TROPONIN I, POC: 0 ng/mL (ref 0.00–0.08)

## 2015-04-02 LAB — CBC
HCT: 26.2 % — ABNORMAL LOW (ref 36.0–46.0)
Hemoglobin: 8.6 g/dL — ABNORMAL LOW (ref 12.0–15.0)
MCH: 25.6 pg — ABNORMAL LOW (ref 26.0–34.0)
MCHC: 32.8 g/dL (ref 30.0–36.0)
MCV: 78 fL (ref 78.0–100.0)
PLATELETS: 484 10*3/uL — AB (ref 150–400)
RBC: 3.36 MIL/uL — ABNORMAL LOW (ref 3.87–5.11)
RDW: 15.7 % — AB (ref 11.5–15.5)
WBC: 8.3 10*3/uL (ref 4.0–10.5)

## 2015-04-02 LAB — MAGNESIUM: Magnesium: 1.5 mg/dL — ABNORMAL LOW (ref 1.7–2.4)

## 2015-04-02 MED ORDER — MAGNESIUM SULFATE 2 GM/50ML IV SOLN
2.0000 g | Freq: Once | INTRAVENOUS | Status: AC
  Administered 2015-04-02: 2 g via INTRAVENOUS
  Filled 2015-04-02: qty 50

## 2015-04-02 NOTE — Discharge Instructions (Signed)
Nonspecific Chest Pain [name], your chest xray, ekg, and blood work today did not show a cause for your chest pain.  See your primary care doctor within 3 days to complete your chest pain work up.  If any symptoms worsen, come back to the ED immediately. Thank you. It is often hard to find the cause of chest pain. There is always a chance that your pain could be related to something serious, such as a heart attack or a blood clot in your lungs. Chest pain can also be caused by conditions that are not life-threatening. If you have chest pain, it is very important to follow up with your doctor.  HOME CARE  If you were prescribed an antibiotic medicine, finish it all even if you start to feel better.  Avoid any activities that cause chest pain.  Do not use any tobacco products, including cigarettes, chewing tobacco, or electronic cigarettes. If you need help quitting, ask your doctor.  Do not drink alcohol.  Take medicines only as told by your doctor.  Keep all follow-up visits as told by your doctor. This is important. This includes any further testing if your chest pain does not go away.  Your doctor may tell you to keep your head raised (elevated) while you sleep.  Make lifestyle changes as told by your doctor. These may include:  Getting regular exercise. Ask your doctor to suggest some activities that are safe for you.  Eating a heart-healthy diet. Your doctor or a diet specialist (dietitian) can help you to learn healthy eating options.  Maintaining a healthy weight.  Managing diabetes, if necessary.  Reducing stress. GET HELP IF:  Your chest pain does not go away, even after treatment.  You have a rash with blisters on your chest.  You have a fever. GET HELP RIGHT AWAY IF:  Your chest pain is worse.  You have an increasing cough, or you cough up blood.  You have severe belly (abdominal) pain.  You feel extremely weak.  You pass out (faint).  You have  chills.  You have sudden, unexplained chest discomfort.  You have sudden, unexplained discomfort in your arms, back, neck, or jaw.  You have shortness of breath at any time.  You suddenly start to sweat, or your skin gets clammy.  You feel nauseous.  You vomit.  You suddenly feel light-headed or dizzy.  Your heart begins to beat quickly, or it feels like it is skipping beats. These symptoms may be an emergency. Do not wait to see if the symptoms will go away. Get medical help right away. Call your local emergency services (911 in the [place] Do not drive yourself to the hospital.   This information is not intended to replace advice given to you by your health care provider. Make sure you discuss any questions you have with your health care provider.   Document Released: 06/10/2007 Document Revised: 01/12/2014 Document Reviewed: 07/28/2013 Elsevier Interactive Patient Education [employer].

## 2015-04-02 NOTE — ED Provider Notes (Signed)
CSN: [id number]     Arrival date & time 04/01/15  2332 History  By signing my name below, I, Rohini Rajnarayanan, attest that this documentation has been prepared under the direction and in the presence of Everlene Balls, MD Electronically Signed: Evonnie Dawes, ED Scribe 04/02/2015 at 3:20 AM.    No chief complaint on file.  The history is provided by the patient. No language interpreter was used.    HPI Comments: Patricia [name] is a 40 y.o. female who presents to the Emergency Department complaining of sharp, sudden onset, left sided CP with radiation to the left arm, which began at about 6PM today. She also c/o associated SOB. Pt states that she has experienced similar sx in the past, and has been to the ED several times before for similar sx. Pt denies diaphoresis, emesis, dizziness, any recent sickness, or any recent sick contacts.    Past Medical History  Diagnosis Date  . Hypertension   . Acid reflux   . High cholesterol   . History of blood transfusion "a couple"    "related to low counts"  . Headache(784.0)     "related to chemo; sometimes weekly" (09/12/2013)  . Type II diabetes mellitus ([place])     "took me off my RX ~ 04/2013"  . Iron deficiency anemia     "get epogen shots q month" (02/20/2014)  . Chronic kidney disease (CKD), stage III (moderate)   . 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 Ankle fracture surgery Right 1994  . Abdominal hysterectomy  2010    "laparoscopic"  . Fracture surgery    . Cardiac catheterization  2000's   Family History  Problem Relation Age of Onset  . Hypertension Mother   . Thyroid disease Mother   . Coronary artery disease Father   . Hypertension Father   . Diabetes Father    Social History  Substance Use Topics  . Smoking status: Never Smoker   . Smokeless tobacco: Never Used  . Alcohol Use: No   OB History     Gravida Para Term Preterm AB TAB SAB Ectopic Multiple Living   3        3      Review of Systems 10 Systems reviewed and all are negative for acute change except as noted in the HPI.  Allergies  Nsaids  Home Medications   Prior to Admission medications   Medication Sig Start Date End Date Taking? Authorizing Provider  amLODipine (NORVASC) 5 MG tablet Take 5 mg by mouth daily. 08/12/14  Yes Historical Provider, MD  aspirin 81 MG chewable tablet Chew 1 tablet (81 mg total) by mouth daily. 10/03/13  Yes Eugenie Filler, MD  atorvastatin (LIPITOR) 40 MG tablet Take 40 mg by mouth daily. 03/14/14  Yes Historical Provider, MD  calcium acetate (PHOSLO) 667 MG tablet Take 1,334 mg by mouth 3 (three) times daily. 08/02/14 08/02/15 Yes Historical Provider, MD  Cholecalciferol (VITAMIN D-1000 MAX ST) 1000 UNITS tablet Take 1,000 Units by mouth daily. 08/02/14 08/02/15 Yes Historical Provider, MD  colchicine 0.6 MG tablet Take 0.6 mg by mouth every other day. 10/30/14 10/30/15 Yes Historical Provider, MD  diphenhydramine-acetaminophen (TYLENOL PM) 25-500 MG TABS tablet Take 2 tablets by mouth at bedtime as needed. For sleep   Yes Historical Provider, MD  lisinopril (PRINIVIL,ZESTRIL) 10 MG tablet Take 10 mg by  mouth daily.   Yes Historical Provider, MD  metolazone (ZAROXOLYN) 2.5 MG tablet Take 1 tablet (2.5 mg total) by mouth daily as needed (for leg swellings or shortness of breath). 04/12/14  Yes Nishant Dhungel, MD  metoprolol tartrate (LOPRESSOR) 25 MG tablet Take 1 tablet (25 mg total) by mouth 2 (two) times daily. 10/03/13  Yes Rodolph Bong, MD  Multiple Vitamin (MULTIVITAMIN WITH MINERALS) TABS tablet Take 1 tablet by mouth daily.   Yes Historical Provider, MD  omeprazole (PRILOSEC) 40 MG capsule Take 40 mg by mouth daily.     Yes Historical Provider, MD  potassium chloride (K-DUR) 10 MEQ tablet Take 10 mEq by mouth daily. 08/12/14  Yes Historical Provider, MD  promethazine (PHENERGAN) 25 MG tablet  Take 1 tablet (25 mg total) by mouth every 6 (six) hours as needed for nausea or vomiting. 01/23/15  Yes Arthor Captain, PA-C  SUMAtriptan (IMITREX) 50 MG tablet Take 50 mg by mouth 2 (two) times daily as needed for migraine. May repeat in 2 hours if headache persists or recurs.   Yes Historical Provider, MD  torsemide (DEMADEX) 20 MG tablet Take 2 tablets (40 mg total) by mouth daily. Patient taking differently: Take 80 mg by mouth daily.  04/12/14  Yes Nishant Dhungel, MD  traZODone (DESYREL) 50 MG tablet Take 50 mg by mouth at bedtime as needed for sleep.    Yes Historical Provider, MD  metoCLOPramide (REGLAN) 10 MG tablet Take 1 tablet (10 mg total) by mouth every 6 (six) hours. Patient not taking: Reported on 04/01/2015 09/20/14   Jaynie Crumble, PA-C  oxyCODONE-acetaminophen (PERCOCET/ROXICET) 5-325 MG tablet Take 1-2 tablets by mouth every 6 (six) hours as needed for severe pain. Patient not taking: Reported on 04/01/2015 01/23/15   Arthor Captain, PA-C  traMADol (ULTRAM) 50 MG tablet Take 1 tablet (50 mg total) by mouth every 6 (six) hours as needed. Patient not taking: Reported on 04/01/2015 09/20/14   Tatyana Kirichenko, PA-C   BP 103/54 mmHg  Pulse 68  Temp(Src) 98.7 F (37.1 C) (Oral)  Resp 15  SpO2 100% Physical Exam  Constitutional: She is oriented to person, place, and time. She appears well-developed and well-nourished. No distress.  HENT:  Head: Normocephalic and atraumatic.  Nose: Nose normal.  Mouth/Throat: Oropharynx is clear and moist. No oropharyngeal exudate.  Eyes: Conjunctivae and EOM are normal. Pupils are equal, round, and reactive to light. No scleral icterus.  Neck: Normal range of motion. Neck supple. No JVD present. No tracheal deviation present. No thyromegaly present.  Cardiovascular: Normal rate, regular rhythm and normal heart sounds.  Exam reveals no gallop and no friction rub.   No murmur heard. Pulmonary/Chest: Effort normal and breath sounds normal. No  respiratory distress. She has no wheezes. She exhibits no tenderness.  Abdominal: Soft. Bowel sounds are normal. She exhibits no distension and no mass. There is no tenderness. There is no rebound and no guarding.  Musculoskeletal: Normal range of motion. She exhibits no edema or tenderness.  Lymphadenopathy:    She has no cervical adenopathy.  Neurological: She is alert and oriented to person, place, and time. No cranial nerve deficit. She exhibits normal muscle tone.  Skin: Skin is warm and dry. No rash noted. No erythema. No pallor.  Nursing note and vitals reviewed.   ED Course  Procedures  DIAGNOSTIC STUDIES: Oxygen Saturation is 100% on RA, normal by my interpretation.    COORDINATION OF CARE: 2:53 AM-Discussed treatment plan which includes DG Chest, troponin,  magnesium, blood work, EKG, and cardiac monitoring, with pt at bedside and pt agreed to plan.   Labs Review Labs Reviewed  BASIC METABOLIC PANEL - Abnormal; Notable for the following:    CO2 20 (*)    Glucose, Bld 107 (*)    BUN 66 (*)    Creatinine, Ser 2.85 (*)    Calcium 8.8 (*)    GFR calc non Af Amer 19 (*)    GFR calc Af Amer 23 (*)    All other components within normal limits  CBC - Abnormal; Notable for the following:    RBC 3.36 (*)    Hemoglobin 8.6 (*)    HCT 26.2 (*)    MCH 25.6 (*)    RDW 15.7 (*)    Platelets 484 (*)    All other components within normal limits  MAGNESIUM - Abnormal; Notable for the following:    Magnesium 1.5 (*)    All other components within normal limits  I-STAT TROPOININ, ED  Randolm Idol, ED    Imaging Review Dg Chest 2 View  04/02/2015  CLINICAL DATA:  40 year old female with left-sided chest pain and shortness of breath EXAM: CHEST  2 VIEW COMPARISON:  Radiograph dated 03/09/2015 FINDINGS: The heart size and mediastinal contours are within normal limits. Both lungs are clear. The visualized skeletal structures are unremarkable. IMPRESSION: No active cardiopulmonary  disease. Electronically Signed   By: Anner Crete M.D.   On: 04/02/2015 00:40   I have personally reviewed and evaluated these images and lab results as part of my medical decision-making.   EKG Interpretation   Date/Time:  Tuesday April 02 2015 03:04:42 EDT Ventricular Rate:  77 PR Interval:  158 QRS Duration: 95 QT Interval:  404 QTC Calculation: 457 R Axis:   50 Text Interpretation:  Sinus rhythm No significant change since last  tracing Confirmed by Glynn Octave [phone]) on 04/02/2015 3:16:54 AM      MDM   Final diagnoses:  Atypical chest pain   Patient presents to the ED for chest pain.  She has multiple visits for this in the past.  Initial troponin and EKG are unremarkable and unchanged.  CXR is negative.  She is PERC negative.  HEART score is 2 for risk factors only.  Will repeat troponin and EKG for full work up.  REpeat troponin negative.  HEART score still 2.  Mag is low and was replaced.  She appears well and in NAD.  VS remain within her nornal limits and she is safe for DC    I personally performed the services described in this documentation, which was scribed in my presence. The recorded information has been reviewed and is accurate.     Everlene Balls, MD 04/02/15 [phone]

## 2015-04-24 ENCOUNTER — Emergency Department (HOSPITAL_COMMUNITY): Payer: Medicaid Other

## 2015-04-24 ENCOUNTER — Encounter (HOSPITAL_COMMUNITY): Payer: Self-pay | Admitting: *Deleted

## 2015-04-24 DIAGNOSIS — E7800 Pure hypercholesterolemia, unspecified: Secondary | ICD-10-CM | POA: Insufficient documentation

## 2015-04-24 DIAGNOSIS — N183 Chronic kidney disease, stage 3 (moderate): Secondary | ICD-10-CM | POA: Diagnosis not present

## 2015-04-24 DIAGNOSIS — H81399 Other peripheral vertigo, unspecified ear: Secondary | ICD-10-CM | POA: Diagnosis not present

## 2015-04-24 DIAGNOSIS — R002 Palpitations: Secondary | ICD-10-CM | POA: Diagnosis not present

## 2015-04-24 DIAGNOSIS — R0602 Shortness of breath: Secondary | ICD-10-CM | POA: Insufficient documentation

## 2015-04-24 DIAGNOSIS — E119 Type 2 diabetes mellitus without complications: Secondary | ICD-10-CM | POA: Insufficient documentation

## 2015-04-24 DIAGNOSIS — D509 Iron deficiency anemia, unspecified: Secondary | ICD-10-CM | POA: Insufficient documentation

## 2015-04-24 DIAGNOSIS — Z79899 Other long term (current) drug therapy: Secondary | ICD-10-CM | POA: Insufficient documentation

## 2015-04-24 DIAGNOSIS — I129 Hypertensive chronic kidney disease with stage 1 through stage 4 chronic kidney disease, or unspecified chronic kidney disease: Secondary | ICD-10-CM | POA: Diagnosis not present

## 2015-04-24 DIAGNOSIS — K219 Gastro-esophageal reflux disease without esophagitis: Secondary | ICD-10-CM | POA: Insufficient documentation

## 2015-04-24 DIAGNOSIS — Z7982 Long term (current) use of aspirin: Secondary | ICD-10-CM | POA: Diagnosis not present

## 2015-04-24 DIAGNOSIS — R61 Generalized hyperhidrosis: Secondary | ICD-10-CM | POA: Insufficient documentation

## 2015-04-24 DIAGNOSIS — R42 Dizziness and giddiness: Secondary | ICD-10-CM | POA: Diagnosis present

## 2015-04-24 LAB — TROPONIN I

## 2015-04-24 LAB — BASIC METABOLIC PANEL
Anion gap: 15 (ref 5–15)
BUN: 78 mg/dL — AB (ref 6–20)
CALCIUM: 8.7 mg/dL — AB (ref 8.9–10.3)
CO2: 21 mmol/L — ABNORMAL LOW (ref 22–32)
CREATININE: 2.97 mg/dL — AB (ref 0.44–1.00)
Chloride: 101 mmol/L (ref 101–111)
GFR calc Af Amer: 21 mL/min — ABNORMAL LOW (ref >60)
GFR, EST NON AFRICAN AMERICAN: 19 mL/min — AB (ref >60)
GLUCOSE: 115 mg/dL — AB (ref 65–99)
Potassium: 4.1 mmol/L (ref 3.5–5.1)
Sodium: 137 mmol/L (ref 135–145)

## 2015-04-24 LAB — CBC
HCT: 31.2 % — ABNORMAL LOW (ref 36.0–46.0)
Hemoglobin: 9.7 g/dL — ABNORMAL LOW (ref 12.0–15.0)
MCH: 24.1 pg — ABNORMAL LOW (ref 26.0–34.0)
MCHC: 31.1 g/dL (ref 30.0–36.0)
MCV: 77.4 fL — ABNORMAL LOW (ref 78.0–100.0)
Platelets: 480 10*3/uL — ABNORMAL HIGH (ref 150–400)
RBC: 4.03 MIL/uL (ref 3.87–5.11)
RDW: 15.3 % (ref 11.5–15.5)
WBC: 17 10*3/uL — ABNORMAL HIGH (ref 4.0–10.5)

## 2015-04-24 NOTE — ED Notes (Signed)
THE PT IS C/O FEELING LIGHTHEADED AND FEELING LIKE HER HEART WAS BEATING FAST WHILE IN [place] TONIGHT.  HX OF THE SAME  LMP NONE

## 2015-04-24 NOTE — ED Notes (Signed)
THE PT JUST STArted on neurontin  And she has had 3 doses.  She thinks that may be the problem

## 2015-04-25 ENCOUNTER — Emergency Department (HOSPITAL_COMMUNITY)
Admission: EM | Admit: 2015-04-25 | Discharge: 2015-04-25 | Disposition: A | Discharge status: Home / Self Care | Payer: Medicaid Other | Attending: Emergency Medicine | Admitting: Emergency Medicine

## 2015-04-25 DIAGNOSIS — N289 Disorder of kidney and ureter, unspecified: Secondary | ICD-10-CM

## 2015-04-25 DIAGNOSIS — D509 Iron deficiency anemia, unspecified: Secondary | ICD-10-CM

## 2015-04-25 DIAGNOSIS — H81399 Other peripheral vertigo, unspecified ear: Secondary | ICD-10-CM

## 2015-04-25 MED ORDER — ONDANSETRON 4 MG PO TBDP
8.0000 mg | ORAL_TABLET | Freq: Once | ORAL | Status: AC
  Administered 2015-04-25: 8 mg via ORAL
  Filled 2015-04-25: qty 2

## 2015-04-25 MED ORDER — MECLIZINE HCL 25 MG PO TABS: 25.0000 mg | ORAL_TABLET | Freq: Three times a day (TID) | ORAL | Status: DC | PRN

## 2015-04-25 MED ORDER — MECLIZINE HCL 25 MG PO TABS
25.0000 mg | ORAL_TABLET | Freq: Once | ORAL | Status: AC
  Administered 2015-04-25: 25 mg via ORAL
  Filled 2015-04-25: qty 1

## 2015-04-25 NOTE — ED Provider Notes (Signed)
CSN: [id number]     Arrival date & time 04/24/15  2054 History  By signing my name below, I, Altamease Oiler, attest that this documentation has been prepared under the direction and in the presence of Delora Fuel, MD. Electronically Signed: Altamease Oiler, ED Scribe. 04/25/2015. 12:28 AM     Chief Complaint  Patient presents with  . Tachycardia    The history is provided by the patient. No language interpreter was used.   Patricia [name] is a 40 y.o. female with history of HTN, high cholesterol, DM, and CKD who presents to the Emergency Department complaining of new and constant light headedness with onset yesterday. The sensation is worse with head movement and improved rest. Associated symptoms include an episode of sweating and racing palpitations, SOB, mild heavy chest pain,  nausea, and a bad taste in the mouth. She associates her symptoms with starting Neurontin 3 days ago.   Past Medical History  Diagnosis Date  . Hypertension   . Acid reflux   . High cholesterol   . History of blood transfusion "a couple"    "related to low counts"  . Headache(784.0)     "related to chemo; sometimes weekly" (09/12/2013)  . Type II diabetes mellitus ([place])     "took me off my RX ~ 04/2013"  . Iron deficiency anemia     "get epogen shots q month" (02/20/2014)  . Chronic kidney disease (CKD), stage III (moderate)   . 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 Ankle fracture surgery Right 1994  . Abdominal hysterectomy  2010    "laparoscopic"  . Fracture surgery    . Cardiac catheterization  2000's   Family History  Problem Relation Age of Onset  . Hypertension Mother   . Thyroid disease Mother   . Coronary artery disease Father   . Hypertension Father   . Diabetes Father    Social History  Substance Use Topics  . Smoking status: Never Smoker   . Smokeless tobacco: Never  Used  . Alcohol Use: No   OB History    Gravida Para Term Preterm AB TAB SAB Ectopic Multiple Living   3        3      Review of Systems  Constitutional: Positive for diaphoresis. Negative for fever.  Respiratory: Positive for shortness of breath.   Cardiovascular: Positive for chest pain and palpitations.  Gastrointestinal: Positive for nausea. Negative for vomiting.  Neurological: Positive for light-headedness.  All other systems reviewed and are negative.  Allergies  Nsaids  Home Medications   Prior to Admission medications   Medication Sig Start Date End Date Taking? Authorizing Provider  amLODipine (NORVASC) 5 MG tablet Take 5 mg by mouth daily. 08/12/14   Historical Provider, MD  aspirin 81 MG chewable tablet Chew 1 tablet (81 mg total) by mouth daily. 10/03/13   Eugenie Filler, MD  atorvastatin (LIPITOR) 40 MG tablet Take 40 mg by mouth daily. 03/14/14   Historical Provider, MD  calcium acetate (PHOSLO) 667 MG tablet Take 1,334 mg by mouth 3 (three) times daily. 08/02/14 08/02/15  Historical Provider, MD  Cholecalciferol (VITAMIN D-1000 MAX ST) 1000 UNITS tablet Take 1,000 Units by mouth daily. 08/02/14 08/02/15  Historical Provider, MD  colchicine 0.6 MG tablet Take 0.6 mg by mouth every other day. 10/30/14 10/30/15  Historical Provider, MD  diphenhydramine-acetaminophen (  TYLENOL PM) 25-500 MG TABS tablet Take 2 tablets by mouth at bedtime as needed. For sleep    Historical Provider, MD  lisinopril (PRINIVIL,ZESTRIL) 10 MG tablet Take 10 mg by mouth daily.    Historical Provider, MD  metoCLOPramide (REGLAN) 10 MG tablet Take 1 tablet (10 mg total) by mouth every 6 (six) hours. Patient not taking: Reported on 04/01/2015 09/20/14   Tatyana Kirichenko, PA-C  metolazone (ZAROXOLYN) 2.5 MG tablet Take 1 tablet (2.5 mg total) by mouth daily as needed (for leg swellings or shortness of breath). 04/12/14   Nishant Dhungel, MD  metoprolol tartrate (LOPRESSOR) 25 MG tablet Take 1 tablet (25 mg  total) by mouth 2 (two) times daily. 10/03/13   Eugenie Filler, MD  Multiple Vitamin (MULTIVITAMIN WITH MINERALS) TABS tablet Take 1 tablet by mouth daily.    Historical Provider, MD  omeprazole (PRILOSEC) 40 MG capsule Take 40 mg by mouth daily.      Historical Provider, MD  oxyCODONE-acetaminophen (PERCOCET/ROXICET) 5-325 MG tablet Take 1-2 tablets by mouth every 6 (six) hours as needed for severe pain. Patient not taking: Reported on 04/01/2015 01/23/15   Margarita Mail, PA-C  potassium chloride (K-DUR) 10 MEQ tablet Take 10 mEq by mouth daily. 08/12/14   Historical Provider, MD  promethazine (PHENERGAN) 25 MG tablet Take 1 tablet (25 mg total) by mouth every 6 (six) hours as needed for nausea or vomiting. 01/23/15   Margarita Mail, PA-C  SUMAtriptan (IMITREX) 50 MG tablet Take 50 mg by mouth 2 (two) times daily as needed for migraine. May repeat in 2 hours if headache persists or recurs.    Historical Provider, MD  torsemide (DEMADEX) 20 MG tablet Take 2 tablets (40 mg total) by mouth daily. Patient taking differently: Take 80 mg by mouth daily.  04/12/14   Nishant Dhungel, MD  traMADol (ULTRAM) 50 MG tablet Take 1 tablet (50 mg total) by mouth every 6 (six) hours as needed. Patient not taking: Reported on 04/01/2015 09/20/14   Jeannett Senior, PA-C  traZODone (DESYREL) 50 MG tablet Take 50 mg by mouth at bedtime as needed for sleep.     Historical Provider, MD   BP 128/73 mmHg  Pulse 77  Temp(Src) 98.6 F (37 C)  Resp 18  Ht $R'5\' 7"'Eg$  (1.702 m)  Wt 228 lb (103.42 kg)  BMI 35.70 kg/m2  SpO2 99% Physical Exam  Constitutional: She is oriented to person, place, and time. She appears well-developed and well-nourished. No distress.  HENT:  Head: Normocephalic and atraumatic.  Eyes: EOM are normal. Pupils are equal, round, and reactive to light.  No nystagmus  Neck: Normal range of motion. Neck supple. No JVD present.  Cardiovascular: Normal rate, regular rhythm and normal heart sounds.   No  murmur heard. Pulmonary/Chest: Effort normal and breath sounds normal. She has no wheezes. She has no rales. She exhibits no tenderness.  Abdominal: Soft. Bowel sounds are normal. She exhibits no distension and no mass. There is no tenderness.  Musculoskeletal: Normal range of motion. She exhibits no edema.  Lymphadenopathy:    She has no cervical adenopathy.  Neurological: She is alert and oriented to person, place, and time. No cranial nerve deficit. She exhibits normal muscle tone. Coordination normal.  Dizziness is reproduced with passive head movement   Skin: Skin is warm and dry. No rash noted.  Psychiatric: She has a normal mood and affect. Her behavior is normal. Judgment and thought content normal.  Nursing note and vitals reviewed.  ED Course  Procedures (including critical care time) DIAGNOSTIC STUDIES: Oxygen Saturation is 99% on RA,  normal by my interpretation.    COORDINATION OF CARE: 12:21 AM Discussed treatment plan which includes lab work, CXR, EKG, Zofran, and meclizine with pt at bedside and pt agreed to plan.  Labs Review Labs Reviewed  BASIC METABOLIC PANEL - Abnormal; Notable for the following:    CO2 21 (*)    Glucose, Bld 115 (*)    BUN 78 (*)    Creatinine, Ser 2.97 (*)    Calcium 8.7 (*)    GFR calc non Af Amer 19 (*)    GFR calc Af Amer 21 (*)    All other components within normal limits  CBC - Abnormal; Notable for the following:    WBC 17.0 (*)    Hemoglobin 9.7 (*)    HCT 31.2 (*)    MCV 77.4 (*)    MCH 24.1 (*)    Platelets 480 (*)    All other components within normal limits  TROPONIN I    Imaging Review Dg Chest 2 View  04/24/2015  CLINICAL DATA:  Weakness.  Rapid heart beat.  Dizzy. EXAM: CHEST  2 VIEW COMPARISON:  04/02/2015 FINDINGS: The cardiomediastinal contours are normal. The lungs are clear. Pulmonary vasculature is normal. No consolidation, pleural effusion, or pneumothorax. No acute osseous abnormalities are seen. Mild  degenerative change in the spine. IMPRESSION: No acute pulmonary process. Electronically Signed   By: Jeb Levering M.D.   On: 04/24/2015 21:39   I have personally reviewed and evaluated these images and lab results as part of my medical decision-making.   EKG Interpretation   Date/Time:  Wednesday April 24 2015 21:01:11 EDT Ventricular Rate:  81 PR Interval:  138 QRS Duration: 86 QT Interval:  370 QTC Calculation: 429 R Axis:   81 Text Interpretation:  Normal sinus rhythm Normal ECG When compared with  ECG of 04/02/2015, No significant change was found Confirmed by [facility]  MD,  Hurley Sobel ([id number]) on 04/25/2015 12:12:28 AM      MDM   Final diagnoses:  Peripheral vertigo, unspecified laterality  Renal insufficiency  Microcytic anemia    Dizziness with nausea that is positional. This is strongly suggestive of peripheral vertigo. Symptoms being reproduced by passive head movement is consistent with this. Palpitations are of uncertain cause. Review of old records shows that she has been evaluated for palpitations and did have one episode of PAT identified on monitor in the past. Symptoms are coming on with starting gabapentin. Dizziness and ataxia are known side effects of this. She will be given therapeutic trial of meclizine. Laboratory workup shows renal insufficiency unchanged from baseline, and anemia which is also unchanged. Anemia is presumably secondary to renal failure.  She had good relief of vertigo with meclizine. She is sent home with prescription for same. She is instructed to discontinue gabapentin and not resume it until all symptoms of vertigo have completely resolved for 1 week. At that point, resume taking gabapentin. Should symptoms recur with resumption of taking gabapentin, then it is presumably a side effect of the medication she should stop taking it completely.  I personally performed the services described in this documentation, which was scribed in my presence. The  recorded information has been reviewed and is accurate.      Delora Fuel, MD [id number]/41 [id number]

## 2015-04-25 NOTE — Discharge Instructions (Signed)
Stopped taking gabapentin (Neurontin). Once you're dizziness has completely resolved, and you have not had any symptoms for at least 1 week, then resume taking the gabapentin (Neurontin). If dizziness starts coming back after resuming Neurontin, then it is a side effect of the medication and stopped taking it.  Vertigo Vertigo means you feel like you or your surroundings are moving when they are not. Vertigo can be dangerous if it occurs when you are at work, driving, or performing difficult activities.  CAUSES  Vertigo occurs when there is a conflict of signals sent to your brain from the visual and sensory systems in your body. There are many different causes of vertigo, including:  Infections, especially in the inner ear.  A bad reaction to a drug or misuse of alcohol and medicines.  Withdrawal from drugs or alcohol.  Rapidly changing positions, such as lying down or rolling over in bed.  A migraine headache.  Decreased blood flow to the brain.  Increased pressure in the brain from a head injury, infection, tumor, or bleeding. SYMPTOMS  You may feel as though the world is spinning around or you are falling to the ground. Because your balance is upset, vertigo can cause nausea and vomiting. You may have involuntary eye movements (nystagmus). DIAGNOSIS  Vertigo is usually diagnosed by physical exam. If the cause of your vertigo is unknown, your caregiver may perform imaging tests, such as an MRI scan (magnetic resonance imaging). TREATMENT  Most cases of vertigo resolve on their own, without treatment. Depending on the cause, your caregiver may prescribe certain medicines. If your vertigo is related to body position issues, your caregiver may recommend movements or procedures to correct the problem. In rare cases, if your vertigo is caused by certain inner ear problems, you may need surgery. HOME CARE INSTRUCTIONS   Follow your caregiver's instructions.  Avoid driving.  Avoid  operating heavy machinery.  Avoid performing any tasks that would be dangerous to you or others during a vertigo episode.  Tell your caregiver if you notice that certain medicines seem to be causing your vertigo. Some of the medicines used to treat vertigo episodes can actually make them worse in some people. SEEK IMMEDIATE MEDICAL CARE IF:   Your medicines do not relieve your vertigo or are making it worse.  You develop problems with talking, walking, weakness, or using your arms, hands, or legs.  You develop severe headaches.  Your nausea or vomiting continues or gets worse.  You develop visual changes.  A family member notices behavioral changes.  Your condition gets worse. MAKE SURE YOU:  Understand these instructions.  Will watch your condition.  Will get help right away if you are not doing well or get worse.   This information is not intended to replace advice given to you by your health care provider. Make sure you discuss any questions you have with your health care provider.   Document Released: 10/01/2004 Document Revised: 03/16/2011 Document Reviewed: 04/16/2014 Elsevier Interactive Patient Education 2016 [employer].  Meclizine tablets or capsules What is this medicine? MECLIZINE (MEK li zeen) is an antihistamine. It is used to prevent nausea, vomiting, or dizziness caused by motion sickness. It is also used to prevent and treat vertigo (extreme dizziness or a feeling that you or your surroundings are tilting or spinning around). This medicine may be used for other purposes; ask your health care provider or pharmacist if you have questions. What should I tell my health care provider before I take this  medicine? They need to know if you have any of these conditions: -glaucoma -lung or breathing disease, like asthma -problems urinating -prostate disease -stomach or intestine problems -an unusual or allergic reaction to meclizine, other medicines, foods, dyes,  or preservatives -pregnant or trying to get pregnant -breast-feeding How should I use this medicine? Take this medicine by mouth with a glass of water. Follow the directions on the prescription label. If you are using this medicine to prevent motion sickness, take the dose at least 1 hour before travel. If it upsets your stomach, take it with food or milk. Take your doses at regular intervals. Do not take your medicine more often than directed. Talk to your pediatrician regarding the use of this medicine in children. Special care may be needed. Overdosage: If you think you have taken too much of this medicine contact a poison control center or emergency room at once. NOTE: This medicine is only for you. Do not share this medicine with others. What if I miss a dose? If you miss a dose, take it as soon as you can. If it is almost time for your next dose, take only that dose. Do not take double or extra doses. What may interact with this medicine? Do not take this medicine with any of the following medications: -MAOIs like Carbex, Eldepryl, Marplan, Nardil, and Parnate This medicine may also interact with the following medications: -alcohol -antihistamines for allergy, cough and cold -certain medicines for anxiety or sleep -certain medicines for depression, like amitriptyline, fluoxetine, sertraline -certain medicines for seizures like phenobarbital, primidone -general anesthetics like halothane, isoflurane, methoxyflurane, propofol -local anesthetics like lidocaine, pramoxine, tetracaine -medicines that relax muscles for surgery -narcotic medicines for pain -phenothiazines like chlorpromazine, mesoridazine, prochlorperazine, thioridazine This list may not describe all possible interactions. Give your health care provider a list of all the medicines, herbs, non-prescription drugs, or dietary supplements you use. Also tell them if you smoke, drink alcohol, or use illegal drugs. Some items may  interact with your medicine. What should I watch for while using this medicine? Tell your doctor or healthcare professional if your symptoms do not start to get better or if they get worse. You may get drowsy or dizzy. Do not drive, use machinery, or do anything that needs mental alertness until you know how this medicine affects you. Do not stand or sit up quickly, especially if you are an older patient. This reduces the risk of dizzy or fainting spells. Alcohol may interfere with the effect of this medicine. Avoid alcoholic drinks. Your mouth may get dry. Chewing sugarless gum or sucking hard candy, and drinking plenty of water may help. Contact your doctor if the problem does not go away or is severe. This medicine may cause dry eyes and blurred vision. If you wear contact lenses you may feel some discomfort. Lubricating drops may help. See your eye doctor if the problem does not go away or is severe. What side effects may I notice from receiving this medicine? Side effects that you should report to your doctor or health care professional as soon as possible: -feeling faint or lightheaded, falls -fast, irregular heartbeat Side effects that usually do not require medical attention (report these to your doctor or health care professional if they continue or are bothersome): -constipation -headache -trouble passing urine or change in the amount of urine -trouble sleeping -upset stomach This list may not describe all possible side effects. Call your doctor for medical advice about side effects. You may report  side effects to FDA at 1-800-FDA-1088. Where should I keep my medicine? Keep out of the reach of children. Store at room temperature between 15 and 30 degrees C (59 and 86 degrees F). Keep container tightly closed. Throw away any unused medicine after the expiration date. NOTE: This sheet is a summary. It may not cover all possible information. If you have questions about this medicine, talk  to your doctor, pharmacist, or health care provider.    2016, Elsevier/Gold Standard. (2014-06-28 09:20:57)

## 2015-04-25 NOTE — ED Notes (Signed)
Pt ambulatory to restroom in NAD, steady gait. Pt also stated she is ready to go, informed her that Roxanne Mins MD would be re-evaluating pt in approx 15-20 min and determine a disposition.

## 2015-04-28 ENCOUNTER — Encounter (HOSPITAL_COMMUNITY): Payer: Self-pay

## 2015-04-28 ENCOUNTER — Observation Stay (HOSPITAL_COMMUNITY)
Admission: EM | Admit: 2015-04-28 | Discharge: 2015-04-29 | Disposition: A | Discharge status: Home / Self Care | Payer: Medicaid Other | Attending: Internal Medicine | Admitting: Internal Medicine

## 2015-04-28 DIAGNOSIS — E1122 Type 2 diabetes mellitus with diabetic chronic kidney disease: Secondary | ICD-10-CM | POA: Diagnosis not present

## 2015-04-28 DIAGNOSIS — I10 Essential (primary) hypertension: Secondary | ICD-10-CM | POA: Diagnosis present

## 2015-04-28 DIAGNOSIS — N184 Chronic kidney disease, stage 4 (severe): Secondary | ICD-10-CM | POA: Insufficient documentation

## 2015-04-28 DIAGNOSIS — R252 Cramp and spasm: Secondary | ICD-10-CM | POA: Diagnosis not present

## 2015-04-28 DIAGNOSIS — Z7982 Long term (current) use of aspirin: Secondary | ICD-10-CM | POA: Diagnosis not present

## 2015-04-28 DIAGNOSIS — I129 Hypertensive chronic kidney disease with stage 1 through stage 4 chronic kidney disease, or unspecified chronic kidney disease: Secondary | ICD-10-CM | POA: Diagnosis not present

## 2015-04-28 DIAGNOSIS — N049 Nephrotic syndrome with unspecified morphologic changes: Secondary | ICD-10-CM | POA: Diagnosis not present

## 2015-04-28 DIAGNOSIS — Z7682 Awaiting organ transplant status: Secondary | ICD-10-CM | POA: Diagnosis not present

## 2015-04-28 DIAGNOSIS — E1121 Type 2 diabetes mellitus with diabetic nephropathy: Secondary | ICD-10-CM | POA: Insufficient documentation

## 2015-04-28 DIAGNOSIS — R251 Tremor, unspecified: Secondary | ICD-10-CM | POA: Diagnosis not present

## 2015-04-28 DIAGNOSIS — D631 Anemia in chronic kidney disease: Secondary | ICD-10-CM | POA: Diagnosis not present

## 2015-04-28 DIAGNOSIS — E7800 Pure hypercholesterolemia, unspecified: Secondary | ICD-10-CM | POA: Insufficient documentation

## 2015-04-28 DIAGNOSIS — E119 Type 2 diabetes mellitus without complications: Secondary | ICD-10-CM

## 2015-04-28 DIAGNOSIS — K219 Gastro-esophageal reflux disease without esophagitis: Secondary | ICD-10-CM | POA: Diagnosis not present

## 2015-04-28 DIAGNOSIS — Z888 Allergy status to other drugs, medicaments and biological substances status: Secondary | ICD-10-CM | POA: Diagnosis not present

## 2015-04-28 LAB — COMPREHENSIVE METABOLIC PANEL
ALBUMIN: 2.7 g/dL — AB (ref 3.5–5.0)
ALT: 19 U/L (ref 14–54)
ANION GAP: 12 (ref 5–15)
AST: 24 U/L (ref 15–41)
Alkaline Phosphatase: 30 U/L — ABNORMAL LOW (ref 38–126)
BUN: 85 mg/dL — ABNORMAL HIGH (ref 6–20)
CHLORIDE: 102 mmol/L (ref 101–111)
CO2: 26 mmol/L (ref 22–32)
Calcium: 7.7 mg/dL — ABNORMAL LOW (ref 8.9–10.3)
Creatinine, Ser: 2.85 mg/dL — ABNORMAL HIGH (ref 0.44–1.00)
GFR calc Af Amer: 23 mL/min — ABNORMAL LOW (ref >60)
GFR calc non Af Amer: 19 mL/min — ABNORMAL LOW (ref >60)
GLUCOSE: 105 mg/dL — AB (ref 65–99)
POTASSIUM: 3.8 mmol/L (ref 3.5–5.1)
SODIUM: 140 mmol/L (ref 135–145)
TOTAL PROTEIN: 6 g/dL — AB (ref 6.5–8.1)
Total Bilirubin: 0.4 mg/dL (ref 0.3–1.2)

## 2015-04-28 LAB — CBC WITH DIFFERENTIAL/PLATELET
BASOS PCT: 0 %
Basophils Absolute: 0 10*3/uL (ref 0.0–0.1)
EOS ABS: 0.1 10*3/uL (ref 0.0–0.7)
Eosinophils Relative: 2 %
HEMATOCRIT: 29.4 % — AB (ref 36.0–46.0)
Hemoglobin: 9.1 g/dL — ABNORMAL LOW (ref 12.0–15.0)
LYMPHS ABS: 2 10*3/uL (ref 0.7–4.0)
Lymphocytes Relative: 23 %
MCH: 24.3 pg — AB (ref 26.0–34.0)
MCHC: 31 g/dL (ref 30.0–36.0)
MCV: 78.4 fL (ref 78.0–100.0)
MONO ABS: 0.6 10*3/uL (ref 0.1–1.0)
MONOS PCT: 7 %
Neutro Abs: 5.9 10*3/uL (ref 1.7–7.7)
Neutrophils Relative %: 68 %
Platelets: 348 10*3/uL (ref 150–400)
RBC: 3.75 MIL/uL — ABNORMAL LOW (ref 3.87–5.11)
RDW: 16.6 % — AB (ref 11.5–15.5)
WBC: 8.6 10*3/uL (ref 4.0–10.5)

## 2015-04-28 LAB — MAGNESIUM: Magnesium: 1.1 mg/dL — ABNORMAL LOW (ref 1.7–2.4)

## 2015-04-28 LAB — CK: CK TOTAL: 257 U/L — AB (ref 38–234)

## 2015-04-28 MED ORDER — MAGNESIUM SULFATE IN D5W 10-5 MG/ML-% IV SOLN
1.0000 g | Freq: Once | INTRAVENOUS | Status: AC
  Administered 2015-04-28: 1 g via INTRAVENOUS
  Filled 2015-04-28: qty 100

## 2015-04-28 MED ORDER — HYDROMORPHONE HCL 1 MG/ML IJ SOLN: 0.5000 mg | Freq: Once | INTRAMUSCULAR | Status: DC

## 2015-04-28 MED ORDER — ONDANSETRON HCL 4 MG/2ML IJ SOLN
4.0000 mg | Freq: Four times a day (QID) | INTRAMUSCULAR | Status: DC | PRN
  Administered 2015-04-28: 4 mg via INTRAVENOUS
  Filled 2015-04-28: qty 2

## 2015-04-28 MED ORDER — VITAMIN D 1000 UNITS PO TABS
1000.0000 [IU] | ORAL_TABLET | Freq: Every day | ORAL | Status: DC
  Administered 2015-04-28 – 2015-04-29 (×2): 1000 [IU] via ORAL
  Filled 2015-04-28 (×2): qty 1

## 2015-04-28 MED ORDER — POTASSIUM CHLORIDE ER 10 MEQ PO TBCR
10.0000 meq | EXTENDED_RELEASE_TABLET | Freq: Every day | ORAL | Status: DC
  Administered 2015-04-28: 10 meq via ORAL
  Filled 2015-04-28 (×3): qty 1

## 2015-04-28 MED ORDER — SODIUM CHLORIDE 0.9% FLUSH
3.0000 mL | Freq: Two times a day (BID) | INTRAVENOUS | Status: DC
  Administered 2015-04-28 – 2015-04-29 (×2): 3 mL via INTRAVENOUS

## 2015-04-28 MED ORDER — OXYCODONE HCL 5 MG PO TABS
5.0000 mg | ORAL_TABLET | Freq: Four times a day (QID) | ORAL | Status: DC | PRN
  Administered 2015-04-29: 5 mg via ORAL
  Filled 2015-04-28: qty 1

## 2015-04-28 MED ORDER — MORPHINE SULFATE (PF) 2 MG/ML IV SOLN
2.0000 mg | INTRAVENOUS | Status: DC | PRN
  Administered 2015-04-28: 2 mg via INTRAVENOUS
  Filled 2015-04-28: qty 1

## 2015-04-28 MED ORDER — ASPIRIN 81 MG PO CHEW
81.0000 mg | CHEWABLE_TABLET | Freq: Every day | ORAL | Status: DC
  Administered 2015-04-28 – 2015-04-29 (×2): 81 mg via ORAL
  Filled 2015-04-28 (×2): qty 1

## 2015-04-28 MED ORDER — HYDROMORPHONE HCL 1 MG/ML IJ SOLN
1.0000 mg | Freq: Once | INTRAMUSCULAR | Status: AC
  Administered 2015-04-28: 1 mg via INTRAVENOUS
  Filled 2015-04-28: qty 1

## 2015-04-28 MED ORDER — TRAZODONE HCL 50 MG PO TABS
50.0000 mg | ORAL_TABLET | Freq: Every evening | ORAL | Status: DC | PRN
Start: 2015-04-28 — End: 2015-04-29

## 2015-04-28 MED ORDER — MAGNESIUM SULFATE 2 GM/50ML IV SOLN
2.0000 g | Freq: Once | INTRAVENOUS | Status: AC
  Administered 2015-04-28: 2 g via INTRAVENOUS
  Filled 2015-04-28: qty 50

## 2015-04-28 MED ORDER — PROMETHAZINE HCL 25 MG PO TABS
25.0000 mg | ORAL_TABLET | Freq: Four times a day (QID) | ORAL | Status: DC | PRN
  Administered 2015-04-28: 25 mg via ORAL
  Filled 2015-04-28: qty 1

## 2015-04-28 MED ORDER — HEPARIN SODIUM (PORCINE) 5000 UNIT/ML IJ SOLN
5000.0000 [IU] | Freq: Three times a day (TID) | INTRAMUSCULAR | Status: DC
  Administered 2015-04-29: 5000 [IU] via SUBCUTANEOUS
  Filled 2015-04-28 (×2): qty 1

## 2015-04-28 MED ORDER — TORSEMIDE 20 MG PO TABS
80.0000 mg | ORAL_TABLET | Freq: Every day | ORAL | Status: DC
  Administered 2015-04-28 – 2015-04-29 (×2): 80 mg via ORAL
  Filled 2015-04-28 (×2): qty 4

## 2015-04-28 MED ORDER — COLCHICINE 0.6 MG PO TABS
0.6000 mg | ORAL_TABLET | ORAL | Status: DC
  Administered 2015-04-29: 0.6 mg via ORAL
  Filled 2015-04-28 (×2): qty 1

## 2015-04-28 MED ORDER — CALCIUM ACETATE (PHOS BINDER) 667 MG PO CAPS
1334.0000 mg | ORAL_CAPSULE | Freq: Three times a day (TID) | ORAL | Status: DC
  Administered 2015-04-28 – 2015-04-29 (×2): 1334 mg via ORAL
  Filled 2015-04-28 (×2): qty 2

## 2015-04-28 NOTE — H&P (Signed)
[facility] History and Physical  Patricia [name] NLG:[id number] DOB: [birth year] DOA: 04/28/2015  Referring physician:  PCP: Loyce Dys, PA-C   Chief Complaint: Generalized pain/cramping  HPI: Patricia [name] is a 40 y.o. female with a past medical history ofhypertension, type 2 diabetes mellitus, stage IV chronic kidney disease presenting to the emergency department with complaints of generalized body aches, cramps that started in the last 24 hours. She reports associated tremors, denies nausea, vomiting, diarrhea, mental status changes, fevers, chills, shortness of breath. She reported symptoms are similar o previous episode of hypomagnesemia. She was recently seen in the emergency department on 04/24/2015 when she presented with complaints of lightheadedness along with palpitations after being started on Neurontin.  In the emergency department workup revealed a magnesium of 1.1 for which she was given a total of 2 g of IV magnesium. Otherwise lab work revealed a stable creatinine of 2.8 with potassium of 3.8 sodium 140.                                                                           Review of Systems:  Constitutional:  No weight loss, night sweats, Fevers, chills, fatigue.  HEENT:  No headaches, Difficulty swallowing,Tooth/dental problems,Sore throat,  No sneezing, itching, ear ache, nasal congestion, post nasal drip,  Cardio-vascular:  No chest pain, Orthopnea, PND, swelling in lower extremities, anasarca, dizziness, palpitations  GI:  No heartburn, indigestion, abdominal pain, nausea, vomiting, diarrhea, change in bowel habits, loss of appetite  Resp:  No shortness of breath with exertion or at rest. No excess mucus, no productive cough, No non-productive cough, No coughing up of blood.No change in color of mucus.No wheezing.No chest wall deformity  Skin:  no rash or lesions.  GU:  no dysuria, change in color of urine, no urgency or frequency. No flank pain.    Musculoskeletal:  Positive for generalized cramps, muscle aches Psych:  No change in mood or affect. No depression or anxiety. No memory loss.   Past Medical History  Diagnosis Date  . Hypertension   . Acid reflux   . High cholesterol   . History of blood transfusion "a couple"    "related to low counts"  . Headache(784.0)     "related to chemo; sometimes weekly" (09/12/2013)  . Type II diabetes mellitus (HCC)     "took me off my RX ~ 04/2013"  . Iron deficiency anemia     "get epogen shots q month" (02/20/2014)  . Chronic kidney disease (CKD), stage III (moderate)   . 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 Ankle fracture surgery Right 1994  . Abdominal hysterectomy  2010    "laparoscopic"  . Fracture surgery    . Cardiac catheterization  2000's   Social History:  reports that she has never smoked. She has never used smokeless tobacco. She reports that she does not drink alcohol or use illicit drugs.  Allergies  Allergen Reactions  . Nsaids Other (See Comments)    Cannot take due to Kidney disease    Family History  Problem Relation Age of Onset  .  Hypertension Mother   . Thyroid disease Mother   . Coronary artery disease Father   . Hypertension Father   . Diabetes Father     Prior to Admission medications   Medication Sig Start Date End Date Taking? Authorizing Provider  allopurinol (ZYLOPRIM) 100 MG tablet Take 100 mg by mouth daily.   Yes Historical Provider, MD  amLODipine (NORVASC) 5 MG tablet Take 5 mg by mouth daily. 08/12/14  Yes Historical Provider, MD  aspirin 81 MG chewable tablet Chew 1 tablet (81 mg total) by mouth daily. 10/03/13  Yes Eugenie Filler, MD  atorvastatin (LIPITOR) 40 MG tablet Take 40 mg by mouth daily. 03/14/14  Yes Historical Provider, MD  calcium acetate (PHOSLO) 667 MG tablet Take 1,334 mg by mouth 3 (three) times daily.  08/02/14 08/02/15 Yes Historical Provider, MD  Cholecalciferol (VITAMIN D-1000 MAX ST) 1000 UNITS tablet Take 1,000 Units by mouth daily. 08/02/14 08/02/15 Yes Historical Provider, MD  colchicine 0.6 MG tablet Take 0.6 mg by mouth every other day. 10/30/14 10/30/15 Yes Historical Provider, MD  diphenhydramine-acetaminophen (TYLENOL PM) 25-500 MG TABS tablet Take 2 tablets by mouth at bedtime as needed. For sleep   Yes Historical Provider, MD  lisinopril (PRINIVIL,ZESTRIL) 10 MG tablet Take 10 mg by mouth daily.   Yes Historical Provider, MD  metolazone (ZAROXOLYN) 2.5 MG tablet Take 1 tablet (2.5 mg total) by mouth daily as needed (for leg swellings or shortness of breath). Patient taking differently: Take 2.5 mg by mouth daily.  04/12/14  Yes Nishant Dhungel, MD  metoprolol tartrate (LOPRESSOR) 25 MG tablet Take 1 tablet (25 mg total) by mouth 2 (two) times daily. 10/03/13  Yes Eugenie Filler, MD  Multiple Vitamin (MULTIVITAMIN WITH MINERALS) TABS tablet Take 1 tablet by mouth daily.   Yes Historical Provider, MD  omeprazole (PRILOSEC) 40 MG capsule Take 40 mg by mouth daily.     Yes Historical Provider, MD  potassium chloride (K-DUR) 10 MEQ tablet Take 10 mEq by mouth daily. 08/12/14  Yes Historical Provider, MD  promethazine (PHENERGAN) 25 MG tablet Take 1 tablet (25 mg total) by mouth every 6 (six) hours as needed for nausea or vomiting. 01/23/15  Yes Margarita Mail, PA-C  torsemide (DEMADEX) 20 MG tablet Take 2 tablets (40 mg total) by mouth daily. Patient taking differently: Take 80 mg by mouth daily.  04/12/14  Yes Nishant Dhungel, MD  traZODone (DESYREL) 50 MG tablet Take 50 mg by mouth at bedtime as needed for sleep.    Yes Historical Provider, MD  meclizine (ANTIVERT) 25 MG tablet Take 1 tablet (25 mg total) by mouth 3 (three) times daily as needed for dizziness. 3/84/66   Delora Fuel, MD   Physical Exam: Filed Vitals:   04/28/15 1315 04/28/15 1330 04/28/15 1345 04/28/15 1415  BP: 126/76 122/81  122/68 109/81  Pulse: 66 64 63 65  Temp:      TempSrc:      Resp: $Remo'20 18 11 11  'SMFcj$ Height:      Weight:      SpO2: 100% 100% 100% 100%    Wt Readings from Last 3 Encounters:  04/28/15 103.42 kg (228 lb)  04/24/15 103.42 kg (228 lb)  01/23/15 103.874 kg (229 lb)    General:  No acute distress, awake and alert oriented Eyes: PERRL, normal lids, irises & conjunctiva ENT: grossly normal hearing, lips & tongue Neck: no LAD, masses or thyromegaly Cardiovascular: RRR, no m/r/g. No LE edema. Telemetry: SR, no arrhythmias  Respiratory: CTA bilaterally, no w/r/r. Normal respiratory effort. Abdomen: soft, ntnd Skin: no rash or induration seen on limited exam Musculoskeletal: grossly normal tone BUE/BLE Psychiatric: grossly normal mood and affect, speech fluent and appropriate Neurologic: grossly non-focal.          Labs on Admission:  Basic Metabolic Panel:  Recent Labs Lab 04/24/15 2142 04/28/15 1126  NA 137 140  K 4.1 3.8  CL 101 102  CO2 21* 26  GLUCOSE 115* 105*  BUN 78* 85*  CREATININE 2.97* 2.85*  CALCIUM 8.7* 7.7*  MG  --  1.1*   Liver Function Tests:  Recent Labs Lab 04/28/15 1126  AST 24  ALT 19  ALKPHOS 30*  BILITOT 0.4  PROT 6.0*  ALBUMIN 2.7*   No results for input(s): LIPASE, AMYLASE in the last 168 hours. No results for input(s): AMMONIA in the last 168 hours. CBC:  Recent Labs Lab 04/24/15 2142 04/28/15 1126  WBC 17.0* 8.6  NEUTROABS  --  5.9  HGB 9.7* 9.1*  HCT 31.2* 29.4*  MCV 77.4* 78.4  PLT 480* 348   Cardiac Enzymes:  Recent Labs Lab 04/24/15 2142  TROPONINI <0.03    BNP (last 3 results)  Recent Labs  06/27/14 2237 06/28/14 2233  BNP 130.8* 67.5    ProBNP (last 3 results) No results for input(s): PROBNP in the last 8760 hours.  CBG: No results for input(s): GLUCAP in the last 168 hours.  Radiological Exams on Admission: No results found.  EKG: Independently reviewed.   Assessment/Plan Principal Problem:    Hypomagnesemia Active Problems:   HTN (hypertension)   DM (diabetes mellitus) (HCC)   Nephrotic syndrome   1. Hypomagnesemia. Patricia [name] is a 40 year old female with a past medical history of stage IV chronic kidney disease, history of hypomagnesemia who had not been on oral replacement therapy, presented to the emergency room with complaints of generalized weakness, muscle cramps and aches. She denied nausea, vomiting, diarrhea. Workup in the emergency department revealed a magnesium level of 1.1. She was given 2 g of IV magnesium in the ER, plan to give her 1 more gram on the floor, for a total of 3 g. EKG revealed a QTC of 469. Will place in overnight observation, repeat magnesium level in a.m. 2. History of stage IV chronic disease. Lab work showing a baseline creatinine of 2.85. Per chart she has history of nephrotic syndrome. She is currently on the transplant list at [facility]. Will continue Demadex 80 mg by mouth daily.  3. Hypertension. Plan to continue metoprolol 25 mg BID, amlodipine 5 mg by mouth daily and lisinopril 10 mg by mouth daily, monitor blood pressures.  4. Anemia of chronic disease. Lab work showing hemoglobin of 9.1, has history of stage IV chronic disease with hemoglobin trended between 8 and 9.  Code Status: Full Code DVT Prophylaxis: Subcutaneous heparin Family Communication: Family not present Disposition Plan: Place in overnight observation, do not anticipate her requiring greater than 2 nights hospitalization  Time spent: 50 min  Kelvin Cellar [facility] Pager [phone]

## 2015-04-28 NOTE — ED Notes (Signed)
Pt reports generalized body cramping that began last night.  Pt reports she has had the same in the past when her magnesium was low.

## 2015-04-28 NOTE — ED Provider Notes (Signed)
CSN: [id number]     Arrival date & time 04/28/15  1017 History   First MD Initiated Contact with Patient 04/28/15 1041     Chief Complaint  Patient presents with  . Generalized Body Aches     (Consider location/radiation/quality/duration/timing/severity/associated sxs/prior Treatment) HPI  40 year old female presents with generalized cramping and body aches. Started yesterday but got significant worse last night. She tells me she has stage IV kidney disease. She has had similar symptoms before when her magnesium is low. She is not on chronic magnesium replacement. She follows with [facility] for her kidney disease and is currently on a transplant list. Only new medication change was that she was taking off of Neurontin 3 days ago after she was starting to have side effects and had to come to the ER. Otherwise no new medicines. No vomiting, diarrhea, or fevers. No urinary symptoms. The cramping is worst in her back but is all over. She took a hydrocodone with no significant relief.  Past Medical History  Diagnosis Date  . Hypertension   . Acid reflux   . High cholesterol   . History of blood transfusion "a couple"    "related to low counts"  . Headache(784.0)     "related to chemo; sometimes weekly" (09/12/2013)  . Type II diabetes mellitus ([place])     "took me off my RX ~ 04/2013"  . Iron deficiency anemia     "get epogen shots q month" (02/20/2014)  . Chronic kidney disease (CKD), stage III (moderate)   . 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 Ankle fracture surgery Right 1994  . Abdominal hysterectomy  2010    "laparoscopic"  . Fracture surgery    . Cardiac catheterization  2000's   Family History  Problem Relation Age of Onset  . Hypertension Mother   . Thyroid disease Mother   . Coronary artery disease Father   . Hypertension Father   . Diabetes Father    Social  History  Substance Use Topics  . Smoking status: Never Smoker   . Smokeless tobacco: Never Used  . Alcohol Use: No   OB History    Gravida Para Term Preterm AB TAB SAB Ectopic Multiple Living   3        3      Review of Systems  Constitutional: Negative for fever.  Respiratory: Negative for shortness of breath.   Cardiovascular: Negative for chest pain.  Gastrointestinal: Negative for nausea, vomiting, abdominal pain and diarrhea.  Genitourinary: Negative for dysuria.  Musculoskeletal: Positive for myalgias and back pain.  All other systems reviewed and are negative.     Allergies  Nsaids  Home Medications   Prior to Admission medications   Medication Sig Start Date End Date Taking? Authorizing Provider  amLODipine (NORVASC) 5 MG tablet Take 5 mg by mouth daily. 08/12/14   Historical Provider, MD  aspirin 81 MG chewable tablet Chew 1 tablet (81 mg total) by mouth daily. 10/03/13   Eugenie Filler, MD  atorvastatin (LIPITOR) 40 MG tablet Take 40 mg by mouth daily. 03/14/14   Historical Provider, MD  calcium acetate (PHOSLO) 667 MG tablet Take 1,334 mg by mouth 3 (three) times daily. 08/02/14 08/02/15  Historical Provider, MD  Cholecalciferol (VITAMIN D-1000 MAX ST) 1000 UNITS tablet Take 1,000 Units by mouth daily. 08/02/14 08/02/15  Historical Provider, MD  colchicine 0.6 MG tablet Take 0.6 mg by mouth every other day. 10/30/14 10/30/15  Historical Provider, MD  diphenhydramine-acetaminophen (TYLENOL PM) 25-500 MG TABS tablet Take 2 tablets by mouth at bedtime as needed. For sleep    Historical Provider, MD  lisinopril (PRINIVIL,ZESTRIL) 10 MG tablet Take 10 mg by mouth daily.    Historical Provider, MD  meclizine (ANTIVERT) 25 MG tablet Take 1 tablet (25 mg total) by mouth 3 (three) times daily as needed for dizziness. 2/50/53   Delora Fuel, MD  metolazone (ZAROXOLYN) 2.5 MG tablet Take 1 tablet (2.5 mg total) by mouth daily as needed (for leg swellings or shortness of breath).  04/12/14   Nishant Dhungel, MD  metoprolol tartrate (LOPRESSOR) 25 MG tablet Take 1 tablet (25 mg total) by mouth 2 (two) times daily. 10/03/13   Eugenie Filler, MD  Multiple Vitamin (MULTIVITAMIN WITH MINERALS) TABS tablet Take 1 tablet by mouth daily.    Historical Provider, MD  omeprazole (PRILOSEC) 40 MG capsule Take 40 mg by mouth daily.      Historical Provider, MD  potassium chloride (K-DUR) 10 MEQ tablet Take 10 mEq by mouth daily. 08/12/14   Historical Provider, MD  promethazine (PHENERGAN) 25 MG tablet Take 1 tablet (25 mg total) by mouth every 6 (six) hours as needed for nausea or vomiting. 01/23/15   Margarita Mail, PA-C  SUMAtriptan (IMITREX) 50 MG tablet Take 50 mg by mouth 2 (two) times daily as needed for migraine. May repeat in 2 hours if headache persists or recurs.    Historical Provider, MD  torsemide (DEMADEX) 20 MG tablet Take 2 tablets (40 mg total) by mouth daily. Patient taking differently: Take 80 mg by mouth daily.  04/12/14   Nishant Dhungel, MD  traZODone (DESYREL) 50 MG tablet Take 50 mg by mouth at bedtime as needed for sleep.     Historical Provider, MD   BP 107/71 mmHg  Pulse 70  Temp(Src) 98.7 F (37.1 C) (Oral)  Resp 16  Ht $R'5\' 7"'dW$  (1.702 m)  Wt 228 lb (103.42 kg)  BMI 35.70 kg/m2  SpO2 100% Physical Exam  Constitutional: She is oriented to person, place, and time. She appears well-developed and well-nourished.  HENT:  Head: Normocephalic and atraumatic.  Right Ear: External ear normal.  Left Ear: External ear normal.  Nose: Nose normal.  Eyes: Right eye exhibits no discharge. Left eye exhibits no discharge.  Cardiovascular: Normal rate, regular rhythm and normal heart sounds.   Pulmonary/Chest: Effort normal and breath sounds normal.  Abdominal: Soft. She exhibits no distension. There is no tenderness.  Musculoskeletal:       Thoracic back: She exhibits tenderness. She exhibits no bony tenderness.       Lumbar back: She exhibits no bony tenderness.        Back:  No focal joint swelling or tenderness in extremities  Neurological: She is alert and oriented to person, place, and time.  Skin: Skin is warm and dry.  Nursing note and vitals reviewed.   ED Course  Procedures (including critical care time) Labs Review Labs Reviewed  CBC WITH DIFFERENTIAL/PLATELET - Abnormal; Notable for the following:    RBC 3.75 (*)    Hemoglobin 9.1 (*)    HCT 29.4 (*)    MCH 24.3 (*)    RDW 16.6 (*)    All other components within normal limits  COMPREHENSIVE METABOLIC PANEL - Abnormal; Notable for the following:    Glucose, Bld 105 (*)  BUN 85 (*)    Creatinine, Ser 2.85 (*)    Calcium 7.7 (*)    Total Protein 6.0 (*)    Albumin 2.7 (*)    Alkaline Phosphatase 30 (*)    GFR calc non Af Amer 19 (*)    GFR calc Af Amer 23 (*)    All other components within normal limits  MAGNESIUM - Abnormal; Notable for the following:    Magnesium 1.1 (*)    All other components within normal limits  CK    Imaging Review No results found. I have personally reviewed and evaluated these images and lab results as part of my medical decision-making.   EKG Interpretation   Date/Time:  Sunday April 28 2015 11:07:21 EDT Ventricular Rate:  69 PR Interval:  149 QRS Duration: 96 QT Interval:  438 QTC Calculation: 469 R Axis:   48 Text Interpretation:  Sinus rhythm Normal ECG no significant change since  April 24 2015 Confirmed by Regenia Skeeter  MD, [place] [phone]) on 04/28/2015  11:31:33 AM      MDM   Final diagnoses:  Hypomagnesemia    Patient appears to be symptomatic from low magnesium. She does not have ECG abnormalities or arrhythmias, seizures, or tetany but I think her aches and pains are related to the magnesium. Potassium is unremarkable. Given her chronic kidney disease and severity of hypomagnesemia, I think admission for further treatment and monitoring is warranted. Dr. Coralyn Pear to admit overnight observation on telemetry. I have given her 2 g IV  magnesium and he will give her an extra 1 g and reevaluate.    Sherwood Gambler, MD 04/28/15 [phone]

## 2015-04-28 NOTE — ED Notes (Signed)
Pt placed into gown and on to monitor upon arrival to room. Pt monitored by blood pressure and pulse ox. Pt has visitor at bedside.

## 2015-04-28 NOTE — ED Notes (Signed)
Attempted report 

## 2015-04-29 DIAGNOSIS — I10 Essential (primary) hypertension: Secondary | ICD-10-CM | POA: Diagnosis not present

## 2015-04-29 DIAGNOSIS — N049 Nephrotic syndrome with unspecified morphologic changes: Secondary | ICD-10-CM | POA: Diagnosis not present

## 2015-04-29 DIAGNOSIS — N184 Chronic kidney disease, stage 4 (severe): Secondary | ICD-10-CM

## 2015-04-29 DIAGNOSIS — E1122 Type 2 diabetes mellitus with diabetic chronic kidney disease: Secondary | ICD-10-CM

## 2015-04-29 LAB — BASIC METABOLIC PANEL
Anion gap: 14 (ref 5–15)
BUN: 73 mg/dL — AB (ref 6–20)
CALCIUM: 7.9 mg/dL — AB (ref 8.9–10.3)
CHLORIDE: 100 mmol/L — AB (ref 101–111)
CO2: 22 mmol/L (ref 22–32)
CREATININE: 2.68 mg/dL — AB (ref 0.44–1.00)
GFR, EST AFRICAN AMERICAN: 24 mL/min — AB (ref >60)
GFR, EST NON AFRICAN AMERICAN: 21 mL/min — AB (ref >60)
Glucose, Bld: 98 mg/dL (ref 65–99)
Potassium: 4.1 mmol/L (ref 3.5–5.1)
SODIUM: 136 mmol/L (ref 135–145)

## 2015-04-29 LAB — CBC
HCT: 31.4 % — ABNORMAL LOW (ref 36.0–46.0)
Hemoglobin: 9.7 g/dL — ABNORMAL LOW (ref 12.0–15.0)
MCH: 24.3 pg — ABNORMAL LOW (ref 26.0–34.0)
MCHC: 30.9 g/dL (ref 30.0–36.0)
MCV: 78.5 fL (ref 78.0–100.0)
PLATELETS: 310 10*3/uL (ref 150–400)
RBC: 4 MIL/uL (ref 3.87–5.11)
RDW: 17.1 % — AB (ref 11.5–15.5)
WBC: 7.6 10*3/uL (ref 4.0–10.5)

## 2015-04-29 LAB — MAGNESIUM: MAGNESIUM: 1.9 mg/dL (ref 1.7–2.4)

## 2015-04-29 MED ORDER — MAGNESIUM OXIDE 400 MG PO TABS: 400.0000 mg | ORAL_TABLET | Freq: Every day | ORAL | Status: DC

## 2015-04-29 MED ORDER — POTASSIUM CHLORIDE CRYS ER 10 MEQ PO TBCR
10.0000 meq | EXTENDED_RELEASE_TABLET | Freq: Every day | ORAL | Status: DC
  Administered 2015-04-29: 10 meq via ORAL
  Filled 2015-04-29: qty 1

## 2015-04-29 NOTE — Discharge Summary (Signed)
Physician Discharge Summary  [name] KGY:[id number] DOB: October 21, 1975 DOA: 04/28/2015  PCP: Tereasa Coop, PA-C  Admit date: 04/28/2015 Discharge date: 04/29/2015   Recommendations for Outpatient Follow-Up:   1. F/U with PCP for repeat magnesium check in 1 week.  Discharged on supplementation but needs close F/U given CKD.   Discharge Diagnosis:   Principal Problem:    Hypomagnesemia with muscle cramps Active Problems:    HTN (hypertension)    DM (diabetes mellitus) (HCC)    Nephrotic syndrome     Stage 4 chronic kidney disease   Discharge disposition:  Home.    Discharge Condition: Improved.  Diet recommendation: Low sodium, heart healthy.  Carbohydrate-modified.    History of Present Illness:   Patricia [name] is an 40 y.o. female with a PMH of hypertension, type 2 diabetes, stage IV CK D was admitted 04/28/15 with chief complaint of generalized body aches and cramps associated with tremors. In the ED, she was found to have magnesium 1.1, creatinine stable at 2.8, potassium 3.8 and sodium 140.   Hospital Course by Problem:   Principal Problem:  Hypomagnesemia with body aches and cramps Magnesium repleted, and currently WNL.Discharged home on routine supplementation given recurrent problems with hypomagnesemia. Will need close follow-up of magnesium level given stage IV chronic kidney disease.  Active Problems:  HTN (hypertension) Continue current medications including Demadex, metoprolol, amlodipine and lisinopril.   DM (diabetes mellitus) ([place]) Resume preadmission medications.    Nephrotic syndrome/stage IV chronic kidney disease Lab work showing a baseline creatinine of 2.85. Per chart she has history of nephrotic syndrome. She is [facility] on the [facility].   Medical Consultants:    None.   Discharge Exam:   Filed Vitals:   04/28/15 2236 04/29/15 0530  BP: 124/61 113/71  Pulse: 71 74  Temp: 98.4 F (36.9 C)  98.3 F (36.8 C)  Resp: 17 17   Filed Vitals:   04/28/15 1537 04/28/15 1540 04/28/15 2236 04/29/15 0530  BP:  114/70 124/61 113/71  Pulse:  61 71 74  Temp:  97.5 F (36.4 C) 98.4 F (36.9 C) 98.3 F (36.8 C)  TempSrc:  Oral Oral Axillary  Resp:  $Remo'16 17 17  'atfIa$ Height: $Rem'5\' 7"'nWpD$  (1.702 m)     Weight: 104.736 kg (230 lb 14.4 oz)     SpO2:  100% 100% 100%    Gen:  NAD Cardiovascular:  RRR, No M/R/G Respiratory: Lungs CTAB Gastrointestinal: Abdomen soft, NT/ND with normal active bowel sounds. Extremities: No C/E/C   The results of significant diagnostics from this hospitalization (including imaging, microbiology, ancillary and laboratory) are listed below for reference.     Procedures and Diagnostic Studies:   No results found.   Labs:   Basic Metabolic Panel:  Recent Labs Lab 04/24/15 2142 04/28/15 1126 04/29/15 0514  NA 137 140 136  K 4.1 3.8 4.1  CL 101 102 100*  CO2 21* 26 22  GLUCOSE 115* 105* 98  BUN 78* 85* 73*  CREATININE 2.97* 2.85* 2.68*  CALCIUM 8.7* 7.7* 7.9*  MG  --  1.1* 1.9   GFR Estimated Creatinine Clearance: 34.4 mL/min (by C-G formula based on Cr of 2.68). Liver Function Tests:  Recent Labs Lab 04/28/15 1126  AST 24  ALT 19  ALKPHOS 30*  BILITOT 0.4  PROT 6.0*  ALBUMIN 2.7*   CBC:  Recent Labs Lab 04/24/15 2142 04/28/15 1126 04/29/15 0514  WBC 17.0* 8.6 7.6  NEUTROABS  --  5.9  --  HGB 9.7* 9.1* 9.7*  HCT 31.2* 29.4* 31.4*  MCV 77.4* 78.4 78.5  PLT 480* 348 310   Cardiac Enzymes:  Recent Labs Lab 04/24/15 2142 04/28/15 1636  CKTOTAL  --  257*  TROPONINI <0.03  --      Discharge Instructions:   Discharge Instructions    Call MD for:    Complete by:  As directed   Recurrent muscle cramps.     Diet - low sodium heart healthy    Complete by:  As directed      Discharge instructions    Complete by:  As directed   Take magnesium oxide twice a day until you follow up with your doctor to have your magnesium level  checked.  If you have recurrent muscle cramps, it is a sign that your calcium or magnesium levels are low.  Try taking TUMS and an extra magnesium tablet, and call your doctor so that they can check your labs.     Increase activity slowly    Complete by:  As directed             Medication List    TAKE these medications        allopurinol 100 MG tablet  Commonly known as:  ZYLOPRIM  Take 100 mg by mouth daily.     amLODipine 5 MG tablet  Commonly known as:  NORVASC  Take 5 mg by mouth daily.     aspirin 81 MG chewable tablet  Chew 1 tablet (81 mg total) by mouth daily.     atorvastatin 40 MG tablet  Commonly known as:  LIPITOR  Take 40 mg by mouth daily.     calcium acetate 667 MG tablet  Commonly known as:  PHOSLO  Take 1,334 mg by mouth 3 (three) times daily.     colchicine 0.6 MG tablet  Take 0.6 mg by mouth every other day.     diphenhydramine-acetaminophen 25-500 MG Tabs tablet  Commonly known as:  TYLENOL PM  Take 2 tablets by mouth at bedtime as needed. For sleep     lisinopril 10 MG tablet  Commonly known as:  PRINIVIL,ZESTRIL  Take 10 mg by mouth daily.     magnesium oxide 400 MG tablet  Commonly known as:  MAG-OX  Take 1 tablet (400 mg total) by mouth daily.     meclizine 25 MG tablet  Commonly known as:  ANTIVERT  Take 1 tablet (25 mg total) by mouth 3 (three) times daily as needed for dizziness.     metolazone 2.5 MG tablet  Commonly known as:  ZAROXOLYN  Take 1 tablet (2.5 mg total) by mouth daily as needed (for leg swellings or shortness of breath).     metoprolol tartrate 25 MG tablet  Commonly known as:  LOPRESSOR  Take 1 tablet (25 mg total) by mouth 2 (two) times daily.     multivitamin with minerals Tabs tablet  Take 1 tablet by mouth daily.     omeprazole 40 MG capsule  Commonly known as:  PRILOSEC  Take 40 mg by mouth daily.     potassium chloride 10 MEQ tablet  Commonly known as:  K-DUR  Take 10 mEq by mouth daily.      promethazine 25 MG tablet  Commonly known as:  PHENERGAN  Take 1 tablet (25 mg total) by mouth every 6 (six) hours as needed for nausea or vomiting.     torsemide 20 MG tablet  Commonly known as:  DEMADEX  Take 2 tablets (40 mg total) by mouth daily.     traZODone 50 MG tablet  Commonly known as:  DESYREL  Take 50 mg by mouth at bedtime as needed for sleep.     VITAMIN D-1000 MAX ST 1000 units tablet  Generic drug:  Cholecalciferol  Take 1,000 Units by mouth daily.           Follow-up Information    Follow up with LONG,ASHLEY B, PA-C.   Specialty:  Physician Assistant   Contact information:   [address] [place] [address] [phone]       Follow up with LONG,ASHLEY B, PA-C.   Specialty:  Physician Assistant   Contact information:   [place] [place] [address] [phone]        Time coordinating discharge: 25 minutes.  Signed:  Emmitt Matthews  Pager [phone] [facility] 04/29/2015, 2:02 PM

## 2015-04-29 NOTE — Discharge Instructions (Signed)
Hypomagnesemia Hypomagnesemia is a condition in which the level of magnesium in the blood is low. Magnesium is a mineral that is found in many foods. It is used in many different processes in the body. Hypomagnesemia can affect every organ in the body. It can cause life-threatening problems. CAUSES Causes of hypomagnesemia include:  Not getting enough magnesium in your diet.  Malnutrition.  Problems with absorbing magnesium from the intestines.  Dehydration.  Alcohol abuse.  Vomiting.  Severe diarrhea.  Some medicines, including medicines that make you urinate more.  Certain diseases, such as kidney disease, diabetes, and overactive thyroid. SIGNS AND SYMPTOMS  Involuntary shaking or trembling of a body part (tremor).  Confusion.  Muscle weakness.  Sensitivity to light, sound, and touch.  Psychiatric issues, such as depression, irritability, or psychosis.  Sudden tightening of muscles (muscle spasms).  Tingling in the arms and legs.  A feeling of fluttering of the heart. These symptoms are more severe if magnesium levels drop suddenly. DIAGNOSIS To make a diagnosis, your health care provider will do a physical exam and order blood and urine tests. TREATMENT Treatment will depend on the cause and the severity of your condition. It may involve:  A magnesium supplement. This can be taken in pill form. It can also be given through an IV tube. This is usually done if the condition is severe.  Changes to your diet. You may be directed to eat foods that have a lot of magnesium, such as green leafy vegetables, peas, beans, and nuts.  Eliminating alcohol from your diet. HOME CARE INSTRUCTIONS  Include foods with magnesium in your diet. Foods that are rich in magnesium include green vegetables, beans, nuts and seeds, and whole grains.  Take medicines only as directed by your health care provider.  Take magnesium supplements if your health care provider instructs you to  do that. Take them as directed.  Have your magnesium levels monitored as directed by your health care provider.  When you are active, drink fluids that contain electrolytes.  Keep all follow-up visits as directed by your health care provider. This is important. SEEK MEDICAL CARE IF:  You get worse instead of better.  Your symptoms return. SEEK IMMEDIATE MEDICAL CARE IF:  Your symptoms are severe.   This information is not intended to replace advice given to you by your health care provider. Make sure you discuss any questions you have with your health care provider.   Document Released: 09/17/2004 Document Revised: 01/12/2014 Document Reviewed: 08/07/2013 Elsevier Interactive Patient Education [employer].

## 2015-05-29 ENCOUNTER — Emergency Department (HOSPITAL_COMMUNITY): Payer: Medicaid Other

## 2015-05-29 ENCOUNTER — Encounter (HOSPITAL_COMMUNITY): Payer: Self-pay | Admitting: Adult Health

## 2015-05-29 ENCOUNTER — Emergency Department (HOSPITAL_COMMUNITY)
Admission: EM | Admit: 2015-05-29 | Discharge: 2015-05-29 | Disposition: A | Discharge status: Home / Self Care | Payer: Medicaid Other | Attending: Emergency Medicine | Admitting: Emergency Medicine

## 2015-05-29 DIAGNOSIS — Z862 Personal history of diseases of the blood and blood-forming organs and certain disorders involving the immune mechanism: Secondary | ICD-10-CM | POA: Insufficient documentation

## 2015-05-29 DIAGNOSIS — Z7982 Long term (current) use of aspirin: Secondary | ICD-10-CM | POA: Diagnosis not present

## 2015-05-29 DIAGNOSIS — I129 Hypertensive chronic kidney disease with stage 1 through stage 4 chronic kidney disease, or unspecified chronic kidney disease: Secondary | ICD-10-CM | POA: Insufficient documentation

## 2015-05-29 DIAGNOSIS — Z79899 Other long term (current) drug therapy: Secondary | ICD-10-CM | POA: Diagnosis not present

## 2015-05-29 DIAGNOSIS — Z9889 Other specified postprocedural states: Secondary | ICD-10-CM | POA: Diagnosis not present

## 2015-05-29 DIAGNOSIS — N183 Chronic kidney disease, stage 3 (moderate): Secondary | ICD-10-CM | POA: Diagnosis not present

## 2015-05-29 DIAGNOSIS — E7800 Pure hypercholesterolemia, unspecified: Secondary | ICD-10-CM | POA: Diagnosis not present

## 2015-05-29 DIAGNOSIS — E1122 Type 2 diabetes mellitus with diabetic chronic kidney disease: Secondary | ICD-10-CM | POA: Diagnosis not present

## 2015-05-29 DIAGNOSIS — K219 Gastro-esophageal reflux disease without esophagitis: Secondary | ICD-10-CM | POA: Diagnosis not present

## 2015-05-29 DIAGNOSIS — R0789 Other chest pain: Secondary | ICD-10-CM | POA: Insufficient documentation

## 2015-05-29 DIAGNOSIS — R079 Chest pain, unspecified: Secondary | ICD-10-CM | POA: Diagnosis present

## 2015-05-29 LAB — I-STAT TROPONIN, ED
TROPONIN I, POC: 0 ng/mL (ref 0.00–0.08)
Troponin i, poc: 0.01 ng/mL (ref 0.00–0.08)

## 2015-05-29 LAB — MAGNESIUM: Magnesium: 1.7 mg/dL (ref 1.7–2.4)

## 2015-05-29 LAB — BASIC METABOLIC PANEL
Anion gap: 10 (ref 5–15)
BUN: 47 mg/dL — AB (ref 6–20)
CHLORIDE: 102 mmol/L (ref 101–111)
CO2: 25 mmol/L (ref 22–32)
CREATININE: 3.08 mg/dL — AB (ref 0.44–1.00)
Calcium: 8.5 mg/dL — ABNORMAL LOW (ref 8.9–10.3)
GFR calc Af Amer: 21 mL/min — ABNORMAL LOW (ref >60)
GFR calc non Af Amer: 18 mL/min — ABNORMAL LOW (ref >60)
Glucose, Bld: 106 mg/dL — ABNORMAL HIGH (ref 65–99)
Potassium: 3.6 mmol/L (ref 3.5–5.1)
Sodium: 137 mmol/L (ref 135–145)

## 2015-05-29 LAB — CBC
HCT: 27.4 % — ABNORMAL LOW (ref 36.0–46.0)
Hemoglobin: 8.4 g/dL — ABNORMAL LOW (ref 12.0–15.0)
MCH: 24.4 pg — AB (ref 26.0–34.0)
MCHC: 30.7 g/dL (ref 30.0–36.0)
MCV: 79.7 fL (ref 78.0–100.0)
PLATELETS: 314 10*3/uL (ref 150–400)
RBC: 3.44 MIL/uL — ABNORMAL LOW (ref 3.87–5.11)
RDW: 17 % — AB (ref 11.5–15.5)
WBC: 7.1 10*3/uL (ref 4.0–10.5)

## 2015-05-29 MED ORDER — POTASSIUM CHLORIDE CRYS ER 20 MEQ PO TBCR
40.0000 meq | EXTENDED_RELEASE_TABLET | Freq: Once | ORAL | Status: AC
  Administered 2015-05-29: 40 meq via ORAL
  Filled 2015-05-29: qty 2

## 2015-05-29 MED ORDER — SODIUM CHLORIDE 0.9 % IV BOLUS (SEPSIS)
1000.0000 mL | Freq: Once | INTRAVENOUS | Status: AC
  Administered 2015-05-29: 1000 mL via INTRAVENOUS

## 2015-05-29 MED ORDER — MAGNESIUM SULFATE 2 GM/50ML IV SOLN
2.0000 g | Freq: Once | INTRAVENOUS | Status: AC
  Administered 2015-05-29: 2 g via INTRAVENOUS
  Filled 2015-05-29: qty 50

## 2015-05-29 NOTE — Discharge Instructions (Signed)
Palpitations Patricia [name], See a primary care physician within 3 days for close follow-up of your chest pain. If any symptoms worsen, come to emergency department immediately. Thank you. A palpitation is the feeling that your heartbeat is irregular. It may feel like your heart is fluttering or skipping a beat. It may also feel like your heart is beating faster than normal. This is usually not a serious problem. In some cases, you may need more medical tests. HOME CARE  Avoid:  Caffeine in coffee, tea, soft drinks, diet pills, and energy drinks.  Chocolate.  Alcohol.  Stop smoking if you smoke.  Reduce your stress and anxiety. Try:  A method that measures bodily functions so you can learn to control them (biofeedback).  Yoga.  Meditation.  Physical activity such as swimming, jogging, or walking.  Get plenty of rest and sleep. GET HELP IF:  Your fast or irregular heartbeat continues after 24 hours.  Your palpitations occur more often. GET HELP RIGHT AWAY IF:   You have chest pain.  You feel short of breath.  You have a very bad headache.  You feel dizzy or pass out (faint). MAKE SURE YOU:   Understand these instructions.  Will watch your condition.  Will get help right away if you are not doing well or get worse.   This information is not intended to replace advice given to you by your health care provider. Make sure you discuss any questions you have with your health care provider.   Document Released: 10/01/2007 Document Revised: 01/12/2014 Document Reviewed: 02/20/2011 Elsevier Interactive Patient Education 2016 Elsevier Inc. Nonspecific Chest Pain It is often hard to find the cause of chest pain. There is always a chance that your pain could be related to something serious, such as a heart attack or a blood clot in your lungs. Chest pain can also be caused by conditions that are not life-threatening. If you have chest pain, it is very important to follow up with  your doctor.  HOME CARE  If you were prescribed an antibiotic medicine, finish it all even if you start to feel better.  Avoid any activities that cause chest pain.  Do not use any tobacco products, including cigarettes, chewing tobacco, or electronic cigarettes. If you need help quitting, ask your doctor.  Do not drink alcohol.  Take medicines only as told by your doctor.  Keep all follow-up visits as told by your doctor. This is important. This includes any further testing if your chest pain does not go away.  Your doctor may tell you to keep your head raised (elevated) while you sleep.  Make lifestyle changes as told by your doctor. These may include:  Getting regular exercise. Ask your doctor to suggest some activities that are safe for you.  Eating a heart-healthy diet. Your doctor or a diet specialist (dietitian) can help you to learn healthy eating options.  Maintaining a healthy weight.  Managing diabetes, if necessary.  Reducing stress. GET HELP IF:  Your chest pain does not go away, even after treatment.  You have a rash with blisters on your chest.  You have a fever. GET HELP RIGHT AWAY IF:  Your chest pain is worse.  You have an increasing cough, or you cough up blood.  You have severe belly (abdominal) pain.  You feel extremely weak.  You pass out (faint).  You have chills.  You have sudden, unexplained chest discomfort.  You have sudden, unexplained discomfort in your arms, back, neck,  or jaw.  You have shortness of breath at any time.  You suddenly start to sweat, or your skin gets clammy.  You feel nauseous.  You vomit.  You suddenly feel light-headed or dizzy.  Your heart begins to beat quickly, or it feels like it is skipping beats. These symptoms may be an emergency. Do not wait to see if the symptoms will go away. Get medical help right away. Call your local emergency services (911 in the [place] Do not drive yourself to the  hospital.   This information is not intended to replace advice given to you by your health care provider. Make sure you discuss any questions you have with your health care provider.   Document Released: 06/10/2007 Document Revised: 01/12/2014 Document Reviewed: 07/28/2013 Elsevier Interactive Patient Education [employer].

## 2015-05-29 NOTE — ED Provider Notes (Signed)
History  By signing my name below, I, Bea Graff, attest that this documentation has been prepared under the direction and in the presence of Everlene Balls, MD. Electronically Signed: Bea Graff, ED Scribe. 05/29/2015. 3:55 AM.  Chief Complaint  Patient presents with  . Chest Pain   The history is provided by the patient and medical records. No language interpreter was used.    HPI Comments:  Patricia [name] is a 40 y.o. morbidly obese female with PMHx of HTN, HLD, DM and CKD who presents to the Emergency Department complaining of sharp, left-sided CP that began yesterday evening while walking around. She reports associated palpitations, nausea and dry-mouth. She reports increased thirst and lack of appetite. She has not taken anything to treat her symptoms. Walking increases her CP. Resting helps to alleviate the pain. She denies fever, chills, vomiting, diarrhea, diaphoresis. She states she has similar symptoms to this in the past. She denies h/o MI.  Past Medical History  Diagnosis Date  . Hypertension   . Acid reflux   . High cholesterol   . History of blood transfusion "a couple"    "related to low counts"  . Headache(784.0)     "related to chemo; sometimes weekly" (09/12/2013)  . Type II diabetes mellitus ([place])     "took me off my RX ~ 04/2013"  . Iron deficiency anemia     "get epogen shots q month" (02/20/2014)  . Chronic kidney disease (CKD), stage III (moderate)   . 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 Ankle fracture surgery Right 1994  . Abdominal hysterectomy  2010    "laparoscopic"  . Fracture surgery    . Cardiac catheterization  2000's   Family History  Problem Relation Age of Onset  . Hypertension Mother   . Thyroid disease Mother   . Coronary artery disease Father   . Hypertension Father   . Diabetes Father    Social History   Substance Use Topics  . Smoking status: Never Smoker   . Smokeless tobacco: Never Used  . Alcohol Use: No   OB History    Gravida Para Term Preterm AB TAB SAB Ectopic Multiple Living   3        3      Review of Systems A complete 10 system review of systems was obtained and all systems are negative except as noted in the HPI and PMH.   Allergies  Nsaids  Home Medications   Prior to Admission medications   Medication Sig Start Date End Date Taking? Authorizing Provider  allopurinol (ZYLOPRIM) 100 MG tablet Take 100 mg by mouth daily.   Yes Historical Provider, MD  amLODipine (NORVASC) 5 MG tablet Take 5 mg by mouth daily. 08/12/14  Yes Historical Provider, MD  aspirin 81 MG chewable tablet Chew 1 tablet (81 mg total) by mouth daily. 10/03/13  Yes Eugenie Filler, MD  atorvastatin (LIPITOR) 40 MG tablet Take 40 mg by mouth daily. 03/14/14  Yes Historical Provider, MD  calcium acetate (PHOSLO) 667 MG tablet Take 1,334 mg by mouth 3 (three) times daily. 08/02/14 08/02/15 Yes Historical Provider, MD  Cholecalciferol (VITAMIN D-1000 MAX ST) 1000 UNITS tablet Take 1,000 Units by mouth daily. 08/02/14 08/02/15 Yes Historical Provider, MD  colchicine 0.6 MG tablet Take 0.6 mg by mouth every other day. 10/30/14 10/30/15 Yes Historical Provider, MD  diphenhydramine-acetaminophen (TYLENOL PM) 25-500 MG TABS tablet Take 2 tablets by mouth at bedtime as needed. For sleep   Yes Historical Provider, MD  lisinopril (PRINIVIL,ZESTRIL) 10 MG tablet Take 10 mg by mouth daily.   Yes Historical Provider, MD  magnesium oxide (MAG-OX) 400 MG tablet Take 1 tablet (400 mg total) by mouth daily. 04/29/15  Yes Venetia Maxon Rama, MD  metolazone (ZAROXOLYN) 2.5 MG tablet Take 1 tablet (2.5 mg total) by mouth daily as needed (for leg swellings or shortness of breath). Patient taking differently: Take 2.5 mg by mouth daily.  04/12/14  Yes Nishant Dhungel, MD  metoprolol tartrate (LOPRESSOR) 25 MG tablet Take 1 tablet (25 mg  total) by mouth 2 (two) times daily. 10/03/13  Yes Eugenie Filler, MD  Multiple Vitamin (MULTIVITAMIN WITH MINERALS) TABS tablet Take 1 tablet by mouth daily.   Yes Historical Provider, MD  omeprazole (PRILOSEC) 40 MG capsule Take 40 mg by mouth daily.     Yes Historical Provider, MD  potassium chloride (K-DUR) 10 MEQ tablet Take 10 mEq by mouth daily. 08/12/14  Yes Historical Provider, MD  torsemide (DEMADEX) 20 MG tablet Take 2 tablets (40 mg total) by mouth daily. Patient taking differently: Take 80 mg by mouth daily.  04/12/14  Yes Nishant Dhungel, MD  traZODone (DESYREL) 50 MG tablet Take 50 mg by mouth at bedtime as needed for sleep.    Yes Historical Provider, MD  meclizine (ANTIVERT) 25 MG tablet Take 1 tablet (25 mg total) by mouth 3 (three) times daily as needed for dizziness. Patient not taking: Reported on 05/29/2015 6/96/29   Delora Fuel, MD  promethazine (PHENERGAN) 25 MG tablet Take 1 tablet (25 mg total) by mouth every 6 (six) hours as needed for nausea or vomiting. Patient not taking: Reported on 05/29/2015 01/23/15   Margarita Mail, PA-C   Triage Vitals: BP 140/78 mmHg  Pulse 86  Temp(Src) 97.7 F (36.5 C) (Oral)  Resp 18  SpO2 97% Physical Exam  Constitutional: She is oriented to person, place, and time. She appears well-developed and well-nourished. No distress.  HENT:  Head: Normocephalic and atraumatic.  Nose: Nose normal.  Mouth/Throat: Oropharynx is clear and moist. No oropharyngeal exudate.  Eyes: Conjunctivae and EOM are normal. Pupils are equal, round, and reactive to light. No scleral icterus.  Neck: Normal range of motion. Neck supple. No JVD present. No tracheal deviation present. No thyromegaly present.  Cardiovascular: Normal rate, regular rhythm and normal heart sounds.  Exam reveals no gallop and no friction rub.   No murmur heard. Pulmonary/Chest: Effort normal and breath sounds normal. No respiratory distress. She has no wheezes. She exhibits no tenderness.   Abdominal: Soft. Bowel sounds are normal. She exhibits no distension and no mass. There is no tenderness. There is no rebound and no guarding.  Musculoskeletal: Normal range of motion. She exhibits no edema or tenderness.  Lymphadenopathy:    She has no cervical adenopathy.  Neurological: She is alert and oriented to person, place, and time. No cranial nerve deficit. She exhibits normal muscle tone.  Skin: Skin is warm and dry. No rash noted. No erythema. No pallor.  Nursing note and vitals reviewed.   ED Course  Procedures (including critical care time) DIAGNOSTIC STUDIES: Oxygen Saturation is 97% on RA, normal by my interpretation.   COORDINATION OF CARE: 2:35 AM- Will order additional labs and oral Potassium. Pt verbalizes understanding and agrees to plan.  Medications  magnesium sulfate IVPB 2 g 50 mL (not  administered)  potassium chloride SA (K-DUR,KLOR-CON) CR tablet 40 mEq (40 mEq Oral Given 05/29/15 0255)  sodium chloride 0.9 % bolus 1,000 mL (1,000 mLs Intravenous New Bag/Given 05/29/15 0255)    Labs Review Labs Reviewed  BASIC METABOLIC PANEL - Abnormal; Notable for the following:    Glucose, Bld 106 (*)    BUN 47 (*)    Creatinine, Ser 3.08 (*)    Calcium 8.5 (*)    GFR calc non Af Amer 18 (*)    GFR calc Af Amer 21 (*)    All other components within normal limits  CBC - Abnormal; Notable for the following:    RBC 3.44 (*)    Hemoglobin 8.4 (*)    HCT 27.4 (*)    MCH 24.4 (*)    RDW 17.0 (*)    All other components within normal limits  MAGNESIUM  I-STAT TROPOININ, ED  I-STAT TROPOININ, ED    Imaging Review Dg Chest 2 View  05/29/2015  CLINICAL DATA:  Acute onset of generalized chest pain and shortness of breath. Initial encounter. EXAM: CHEST  2 VIEW COMPARISON:  Chest radiograph performed 04/24/2015 FINDINGS: The lungs are well-aerated. Minimal bibasilar atelectasis is noted. There is no evidence of pleural effusion or pneumothorax. The heart is normal in  size; the mediastinal contour is within normal limits. No acute osseous abnormalities are seen. IMPRESSION: Minimal bibasilar atelectasis noted.  Lungs otherwise clear. Electronically Signed   By: Garald Balding M.D.   On: 05/29/2015 00:51   I have personally reviewed and evaluated these images and lab results as part of my medical decision-making.   EKG Interpretation   Date/Time:  Wednesday May 29 2015 02:40:33 EDT Ventricular Rate:  68 PR Interval:  152 QRS Duration: 96 QT Interval:  437 QTC Calculation: 465 R Axis:   58 Text Interpretation:  Sinus rhythm No significant change since last  tracing Confirmed by Glynn Octave [phone]) on 05/29/2015 4:00:48 AM      MDM   Final diagnoses:  None   Patient persists to the emergency department for chest pain. It is atypical and not consistent with ACS. She also experienced palpitations. She states this often happens in her left R off. Her potassium was replaced to 4, magnesium replaced to 2. EKG does not show any signs of ischemia, repeat troponin separated by 3 hours were both negative. She appears well and in no acute distress, vital signs were within her normal limits and she is safe for discharge and primary care follow-up within 3 days.    I personally performed the services described in this documentation, which was scribed in my presence. The recorded information has been reviewed and is accurate.       Everlene Balls, MD 05/29/15 0400

## 2015-05-29 NOTE — ED Notes (Signed)
Presents with left sided chest pain described as stabbing and intermittent assocsiated with nausea, SOB, fatigue and left arm radiation. Pain began this evening, for 2-3 days endorses palpitations.

## 2015-06-17 ENCOUNTER — Encounter (HOSPITAL_COMMUNITY): Payer: Self-pay | Admitting: *Deleted

## 2015-06-17 ENCOUNTER — Observation Stay (HOSPITAL_COMMUNITY)
Admission: EM | Admit: 2015-06-17 | Discharge: 2015-06-19 | Disposition: A | Discharge status: Home / Self Care | Payer: Medicaid Other | Attending: Internal Medicine | Admitting: Internal Medicine

## 2015-06-17 ENCOUNTER — Emergency Department (HOSPITAL_COMMUNITY): Payer: Medicaid Other

## 2015-06-17 DIAGNOSIS — N183 Chronic kidney disease, stage 3 unspecified: Secondary | ICD-10-CM | POA: Diagnosis present

## 2015-06-17 DIAGNOSIS — I129 Hypertensive chronic kidney disease with stage 1 through stage 4 chronic kidney disease, or unspecified chronic kidney disease: Secondary | ICD-10-CM | POA: Diagnosis not present

## 2015-06-17 DIAGNOSIS — D649 Anemia, unspecified: Secondary | ICD-10-CM | POA: Diagnosis not present

## 2015-06-17 DIAGNOSIS — E1122 Type 2 diabetes mellitus with diabetic chronic kidney disease: Secondary | ICD-10-CM | POA: Insufficient documentation

## 2015-06-17 DIAGNOSIS — Z7982 Long term (current) use of aspirin: Secondary | ICD-10-CM | POA: Insufficient documentation

## 2015-06-17 DIAGNOSIS — Z79899 Other long term (current) drug therapy: Secondary | ICD-10-CM | POA: Diagnosis not present

## 2015-06-17 DIAGNOSIS — E119 Type 2 diabetes mellitus without complications: Secondary | ICD-10-CM

## 2015-06-17 DIAGNOSIS — R791 Abnormal coagulation profile: Secondary | ICD-10-CM | POA: Insufficient documentation

## 2015-06-17 DIAGNOSIS — R079 Chest pain, unspecified: Secondary | ICD-10-CM | POA: Diagnosis present

## 2015-06-17 DIAGNOSIS — I4719 Other supraventricular tachycardia: Secondary | ICD-10-CM | POA: Diagnosis present

## 2015-06-17 DIAGNOSIS — R9431 Abnormal electrocardiogram [ECG] [EKG]: Secondary | ICD-10-CM | POA: Diagnosis not present

## 2015-06-17 DIAGNOSIS — R0789 Other chest pain: Secondary | ICD-10-CM | POA: Diagnosis present

## 2015-06-17 DIAGNOSIS — I471 Supraventricular tachycardia: Secondary | ICD-10-CM | POA: Diagnosis present

## 2015-06-17 DIAGNOSIS — I10 Essential (primary) hypertension: Secondary | ICD-10-CM | POA: Diagnosis present

## 2015-06-17 DIAGNOSIS — E785 Hyperlipidemia, unspecified: Secondary | ICD-10-CM | POA: Diagnosis present

## 2015-06-17 LAB — CBC
HEMATOCRIT: 25.4 % — AB (ref 36.0–46.0)
Hemoglobin: 7.8 g/dL — ABNORMAL LOW (ref 12.0–15.0)
MCH: 23.9 pg — ABNORMAL LOW (ref 26.0–34.0)
MCHC: 30.7 g/dL (ref 30.0–36.0)
MCV: 77.9 fL — ABNORMAL LOW (ref 78.0–100.0)
PLATELETS: 339 10*3/uL (ref 150–400)
RBC: 3.26 MIL/uL — ABNORMAL LOW (ref 3.87–5.11)
RDW: 17.2 % — AB (ref 11.5–15.5)
WBC: 5.6 10*3/uL (ref 4.0–10.5)

## 2015-06-17 LAB — BASIC METABOLIC PANEL
ANION GAP: 9 (ref 5–15)
BUN: 60 mg/dL — AB (ref 6–20)
CALCIUM: 7.8 mg/dL — AB (ref 8.9–10.3)
CO2: 23 mmol/L (ref 22–32)
Chloride: 105 mmol/L (ref 101–111)
Creatinine, Ser: 3.06 mg/dL — ABNORMAL HIGH (ref 0.44–1.00)
GFR calc Af Amer: 21 mL/min — ABNORMAL LOW (ref >60)
GFR, EST NON AFRICAN AMERICAN: 18 mL/min — AB (ref >60)
Glucose, Bld: 104 mg/dL — ABNORMAL HIGH (ref 65–99)
POTASSIUM: 3.9 mmol/L (ref 3.5–5.1)
SODIUM: 137 mmol/L (ref 135–145)

## 2015-06-17 LAB — I-STAT TROPONIN, ED: TROPONIN I, POC: 0 ng/mL (ref 0.00–0.08)

## 2015-06-17 MED ORDER — NITROGLYCERIN 0.4 MG SL SUBL
0.4000 mg | SUBLINGUAL_TABLET | SUBLINGUAL | Status: DC | PRN
  Administered 2015-06-17 – 2015-06-18 (×2): 0.4 mg via SUBLINGUAL
  Filled 2015-06-17 (×3): qty 1

## 2015-06-17 NOTE — Care Management (Signed)
ED CM noted patient to have had 8 ED visits and 1 hospital stay in the past 6 months CM to meet with patient to discuss assistance with care coordination,  once patient returns to room patient OTF.

## 2015-06-17 NOTE — ED Notes (Signed)
Pt to ED from home by EMS c/o L sided chest pain radiating into L arm. Has had shortness of breath when attempting to lie on her L side, denies at this time. Pt took Tums thinking it was GERD, but pain feels different. Pt given 324 mg ASA, nitro x 3, and $Remo'4mg'NIwho$  zofran. Pain decreased from 8/10 to 6/10

## 2015-06-17 NOTE — ED Provider Notes (Signed)
CSN: [id number]     Arrival date & time 06/17/15  2046 History   First MD Initiated Contact with Patient 06/17/15 2058     Chief Complaint  Patient presents with  . Chest Pain   Patient is a 40 y.o. female presenting with chest pain.  Chest Pain Pain location:  L chest Pain quality: pressure   Pain radiates to:  L arm (right chest) Pain radiates to the back: yes   Pain severity:  Moderate Onset quality:  Sudden Timing:  Constant Progression:  Partially resolved Chronicity:  New Context: at rest and stress   Context: not breathing, not lifting and no movement   Relieved by:  Nitroglycerin Worsened by:  Exertion Ineffective treatments:  Antacids Associated symptoms: diaphoresis, nausea and shortness of breath   Associated symptoms: no abdominal pain, no cough and no fever   Risk factors: diabetes mellitus, high cholesterol and obesity    Past Medical History  Diagnosis Date  . Hypertension   . Acid reflux   . High cholesterol   . History of blood transfusion "a couple"    "related to low counts"  . Headache(784.0)     "related to chemo; sometimes weekly" (09/12/2013)  . Type II diabetes mellitus ([place])     "took me off my RX ~ 04/2013"  . Iron deficiency anemia     "get epogen shots q month" (02/20/2014)  . Chronic kidney disease (CKD), stage III (moderate)   . 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 Ankle fracture surgery Right 1994  . Abdominal hysterectomy  2010    "laparoscopic"  . Fracture surgery    . Cardiac catheterization  2000's   Family History  Problem Relation Age of Onset  . Hypertension Mother   . Thyroid disease Mother   . Coronary artery disease Father   . Hypertension Father   . Diabetes Father    Social History  Substance Use Topics  . Smoking status: Never Smoker   . Smokeless tobacco: Never Used  . Alcohol Use: No   OB History     Gravida Para Term Preterm AB TAB SAB Ectopic Multiple Living   3        3      Review of Systems  Constitutional: Positive for diaphoresis. Negative for fever.  Respiratory: Positive for shortness of breath. Negative for cough.   Cardiovascular: Positive for chest pain.  Gastrointestinal: Positive for nausea. Negative for abdominal pain.   Allergies  Nsaids  Home Medications   Prior to Admission medications   Medication Sig Start Date End Date Taking? Authorizing Provider  allopurinol (ZYLOPRIM) 100 MG tablet Take 100 mg by mouth daily.   Yes Historical Provider, MD  amLODipine (NORVASC) 5 MG tablet Take 5 mg by mouth daily. 08/12/14  Yes Historical Provider, MD  aspirin 81 MG chewable tablet Chew 1 tablet (81 mg total) by mouth daily. 10/03/13  Yes Eugenie Filler, MD  atorvastatin (LIPITOR) 40 MG tablet Take 40 mg by mouth daily. 03/14/14  Yes Historical Provider, MD  calcium acetate (PHOSLO) 667 MG tablet Take 1,334 mg by mouth 3 (three) times daily. 08/02/14 08/02/15 Yes Historical Provider, MD  Cholecalciferol (VITAMIN D-1000 MAX ST) 1000 UNITS tablet Take 1,000 Units by mouth daily. 08/02/14 08/02/15 Yes Historical Provider, MD  colchicine 0.6 MG tablet Take 0.6 mg by mouth every other  day. 10/30/14 10/30/15 Yes Historical Provider, MD  diphenhydramine-acetaminophen (TYLENOL PM) 25-500 MG TABS tablet Take 2 tablets by mouth at bedtime as needed. For sleep   Yes Historical Provider, MD  gabapentin (NEURONTIN) 300 MG capsule Take 300 mg by mouth 2 (two) times daily. 06/12/15 06/11/16 Yes Historical Provider, MD  lisinopril (PRINIVIL,ZESTRIL) 10 MG tablet Take 10 mg by mouth daily.   Yes Historical Provider, MD  magnesium oxide (MAG-OX) 400 MG tablet Take 1 tablet (400 mg total) by mouth daily. 04/29/15  Yes Venetia Maxon Rama, MD  metolazone (ZAROXOLYN) 2.5 MG tablet Take 1 tablet (2.5 mg total) by mouth daily as needed (for leg swellings or shortness of breath). Patient taking differently:  Take 2.5 mg by mouth daily.  04/12/14  Yes Nishant Dhungel, MD  metoprolol tartrate (LOPRESSOR) 25 MG tablet Take 1 tablet (25 mg total) by mouth 2 (two) times daily. 10/03/13  Yes Eugenie Filler, MD  Multiple Vitamin (MULTIVITAMIN WITH MINERALS) TABS tablet Take 1 tablet by mouth daily.   Yes Historical Provider, MD  omeprazole (PRILOSEC) 40 MG capsule Take 40 mg by mouth daily.     Yes Historical Provider, MD  potassium chloride (K-DUR) 10 MEQ tablet Take 10 mEq by mouth daily. 08/12/14  Yes Historical Provider, MD  promethazine (PHENERGAN) 25 MG tablet Take 1 tablet (25 mg total) by mouth every 6 (six) hours as needed for nausea or vomiting. 01/23/15  Yes Margarita Mail, PA-C  torsemide (DEMADEX) 20 MG tablet Take 2 tablets (40 mg total) by mouth daily. Patient taking differently: Take 80 mg by mouth daily.  04/12/14  Yes Nishant Dhungel, MD  meclizine (ANTIVERT) 25 MG tablet Take 1 tablet (25 mg total) by mouth 3 (three) times daily as needed for dizziness. Patient not taking: Reported on 05/29/2015 2/44/01   Delora Fuel, MD  traZODone (DESYREL) 50 MG tablet Take 50 mg by mouth at bedtime as needed for sleep.     Historical Provider, MD   BP 121/80 mmHg  Pulse 71  Temp(Src) 98.7 F (37.1 C) (Oral)  Resp 15  SpO2 100% Physical Exam  Constitutional: She appears well-developed and well-nourished. No distress.  HENT:  Head: Normocephalic and atraumatic.  Eyes: Conjunctivae are normal. Right eye exhibits no discharge. Left eye exhibits no discharge.  Neck: Normal range of motion. Neck supple. No JVD present.  Cardiovascular: Normal rate, regular rhythm, normal heart sounds and intact distal pulses.   No murmur heard. Pulmonary/Chest: Effort normal and breath sounds normal. No respiratory distress.  Abdominal: Soft. Bowel sounds are normal. She exhibits no distension and no mass. There is no tenderness. There is no rebound and no guarding.  Musculoskeletal: She exhibits edema (1+ bilateral  pitting edema, symmetrical of lower extremities). She exhibits no tenderness (no calf tenderness).  Neurological: She is alert.  Skin: Skin is warm. No rash noted.  Psychiatric: She has a normal mood and affect.  Nursing note and vitals reviewed.  ED Course  Procedures  Labs Review Labs Reviewed  BASIC METABOLIC PANEL - Abnormal; Notable for the following:    Glucose, Bld 104 (*)    BUN 60 (*)    Creatinine, Ser 3.06 (*)    Calcium 7.8 (*)    GFR calc non Af Amer 18 (*)    GFR calc Af Amer 21 (*)    All other components within normal limits  CBC - Abnormal; Notable for the following:    RBC 3.26 (*)    Hemoglobin 7.8 (*)  HCT 25.4 (*)    MCV 77.9 (*)    MCH 23.9 (*)    RDW 17.2 (*)    All other components within normal limits  TROPONIN I  TROPONIN I  TROPONIN I  URINALYSIS, ROUTINE W REFLEX MICROSCOPIC (NOT AT [facility])  PROTIME-INR  CALCIUM, IONIZED  MAGNESIUM  PHOSPHORUS  I-STAT TROPOININ, ED  TYPE AND SCREEN  PREPARE RBC (CROSSMATCH)    Imaging Review Dg Chest 2 View  06/17/2015  CLINICAL DATA:  Acute onset of left-sided chest pain radiating to the left arm. Shortness of breath. Initial encounter. EXAM: CHEST  2 VIEW COMPARISON:  Chest radiograph from 05/29/2015 FINDINGS: The lungs are well-aerated. Mild peribronchial thickening is noted. There is no evidence of focal opacification, pleural effusion or pneumothorax. The heart is normal in size; the mediastinal contour is within normal limits. No acute osseous abnormalities are seen. IMPRESSION: Mild peribronchial thickening noted.  Lungs otherwise clear. Electronically Signed   By: Garald Balding M.D.   On: 06/17/2015 21:40   I have personally reviewed and evaluated these images and lab results as part of my medical decision-making.   EKG Interpretation   Date/Time:  Monday June 17 2015 20:59:01 EDT Ventricular Rate:  69 PR Interval:  151 QRS Duration: 93 QT Interval:  434 QTC Calculation: 465 R Axis:   52 Text  Interpretation:  Sinus rhythm Baseline wander in lead(s) V2 No  significant change since last tracing Confirmed by Christy Gentles  MD, DONALD  [phone]) on 06/17/2015 9:05:50 PM      MDM   Final diagnoses:  Anterior T wave inversion   Chest pain and surgically extremity consistent with ACS. Patient has been evaluated multiple times for this but today when EMS arrived, EKG with new T-wave inversions in the anteroseptal leads concerning for ACS. Chest pain is improved after nitroglycerin and repeat EKG here does not reveal new acute ischemic changes. However, despite negative initial troponin, concerning for unstable angina given ongoing pain and dynamic changes on EKG. Patient has severe kidney disease and is pending kidney transplant so likely not a candidate for emergent catheterization, however, likely requires a further cardiac evaluation. Attempted to speak with cardiology, pending their call back x2. At this time, patient is improved from her chest pain and EKG without ischemic changes, nitrates no longer helping her pain, will hold from heparinization. Patient did receive aspirin. Doubt pulmonary embolism, patient is perk negative. And low risk within a typical story for this. Doubt dissection, patient has bilateral pulses and no significant tearing back pain. Doubt pneumonia, chest x-ray without such and no infectious symptoms or white count. Will admit patient to stepdown unit given ongoing chest pain.    Karma Greaser, MD 06/18/15 4034  Ripley Fraise, MD 06/18/15 [phone]

## 2015-06-18 ENCOUNTER — Other Ambulatory Visit (HOSPITAL_COMMUNITY): Payer: Medicaid Other

## 2015-06-18 ENCOUNTER — Observation Stay (HOSPITAL_BASED_OUTPATIENT_CLINIC_OR_DEPARTMENT_OTHER): Payer: Medicaid Other

## 2015-06-18 ENCOUNTER — Observation Stay (HOSPITAL_COMMUNITY): Payer: Medicaid Other

## 2015-06-18 ENCOUNTER — Other Ambulatory Visit: Payer: Self-pay

## 2015-06-18 ENCOUNTER — Encounter (HOSPITAL_COMMUNITY): Payer: Self-pay | Admitting: Internal Medicine

## 2015-06-18 DIAGNOSIS — N183 Chronic kidney disease, stage 3 (moderate): Secondary | ICD-10-CM | POA: Diagnosis not present

## 2015-06-18 DIAGNOSIS — R079 Chest pain, unspecified: Secondary | ICD-10-CM

## 2015-06-18 DIAGNOSIS — R9431 Abnormal electrocardiogram [ECG] [EKG]: Secondary | ICD-10-CM | POA: Diagnosis not present

## 2015-06-18 DIAGNOSIS — R0789 Other chest pain: Secondary | ICD-10-CM | POA: Diagnosis not present

## 2015-06-18 DIAGNOSIS — E785 Hyperlipidemia, unspecified: Secondary | ICD-10-CM | POA: Diagnosis not present

## 2015-06-18 DIAGNOSIS — E1122 Type 2 diabetes mellitus with diabetic chronic kidney disease: Secondary | ICD-10-CM

## 2015-06-18 DIAGNOSIS — D649 Anemia, unspecified: Secondary | ICD-10-CM

## 2015-06-18 DIAGNOSIS — I129 Hypertensive chronic kidney disease with stage 1 through stage 4 chronic kidney disease, or unspecified chronic kidney disease: Secondary | ICD-10-CM | POA: Diagnosis not present

## 2015-06-18 DIAGNOSIS — N184 Chronic kidney disease, stage 4 (severe): Secondary | ICD-10-CM

## 2015-06-18 DIAGNOSIS — I10 Essential (primary) hypertension: Secondary | ICD-10-CM

## 2015-06-18 DIAGNOSIS — I471 Supraventricular tachycardia: Secondary | ICD-10-CM

## 2015-06-18 LAB — LIPID PANEL
CHOLESTEROL: 196 mg/dL (ref 0–200)
HDL: 34 mg/dL — AB (ref >40)
LDL Cholesterol: 109 mg/dL — ABNORMAL HIGH (ref 0–99)
TRIGLYCERIDES: 265 mg/dL — AB (ref <150)
Total CHOL/HDL Ratio: 5.8 RATIO
VLDL: 53 mg/dL — ABNORMAL HIGH (ref 0–40)

## 2015-06-18 LAB — CBC
HEMATOCRIT: 27.8 % — AB (ref 36.0–46.0)
HEMOGLOBIN: 8.5 g/dL — AB (ref 12.0–15.0)
MCH: 24.3 pg — AB (ref 26.0–34.0)
MCHC: 30.6 g/dL (ref 30.0–36.0)
MCV: 79.4 fL (ref 78.0–100.0)
Platelets: 306 10*3/uL (ref 150–400)
RBC: 3.5 MIL/uL — ABNORMAL LOW (ref 3.87–5.11)
RDW: 17.1 % — ABNORMAL HIGH (ref 11.5–15.5)
WBC: 5 10*3/uL (ref 4.0–10.5)

## 2015-06-18 LAB — PREPARE RBC (CROSSMATCH)

## 2015-06-18 LAB — PROTIME-INR
INR: 0.98 (ref 0.00–1.49)
Prothrombin Time: 13.2 seconds (ref 11.6–15.2)

## 2015-06-18 LAB — COMPREHENSIVE METABOLIC PANEL
ALK PHOS: 30 U/L — AB (ref 38–126)
ALT: 14 U/L (ref 14–54)
ANION GAP: 9 (ref 5–15)
AST: 20 U/L (ref 15–41)
Albumin: 2.7 g/dL — ABNORMAL LOW (ref 3.5–5.0)
BILIRUBIN TOTAL: 0.4 mg/dL (ref 0.3–1.2)
BUN: 59 mg/dL — ABNORMAL HIGH (ref 6–20)
CALCIUM: 7.9 mg/dL — AB (ref 8.9–10.3)
CO2: 24 mmol/L (ref 22–32)
Chloride: 104 mmol/L (ref 101–111)
Creatinine, Ser: 3 mg/dL — ABNORMAL HIGH (ref 0.44–1.00)
GFR calc non Af Amer: 18 mL/min — ABNORMAL LOW (ref >60)
GFR, EST AFRICAN AMERICAN: 21 mL/min — AB (ref >60)
Glucose, Bld: 92 mg/dL (ref 65–99)
Potassium: 3.7 mmol/L (ref 3.5–5.1)
Sodium: 137 mmol/L (ref 135–145)
TOTAL PROTEIN: 5.5 g/dL — AB (ref 6.5–8.1)

## 2015-06-18 LAB — URINE MICROSCOPIC-ADD ON: WBC, UA: NONE SEEN WBC/hpf (ref 0–5)

## 2015-06-18 LAB — TSH: TSH: 1.973 u[IU]/mL (ref 0.350–4.500)

## 2015-06-18 LAB — TROPONIN I: Troponin I: <0.03 ng/mL (ref <0.031)

## 2015-06-18 LAB — URINALYSIS, ROUTINE W REFLEX MICROSCOPIC
Bilirubin Urine: NEGATIVE
GLUCOSE, UA: NEGATIVE mg/dL
Ketones, ur: NEGATIVE mg/dL
Leukocytes, UA: NEGATIVE
Nitrite: NEGATIVE
SPECIFIC GRAVITY, URINE: 1.008 (ref 1.005–1.030)
pH: 6.5 (ref 5.0–8.0)

## 2015-06-18 LAB — MAGNESIUM
Magnesium: 1.7 mg/dL (ref 1.7–2.4)
Magnesium: 1.7 mg/dL (ref 1.7–2.4)

## 2015-06-18 LAB — PHOSPHORUS
PHOSPHORUS: 4.8 mg/dL — AB (ref 2.5–4.6)
Phosphorus: 5 mg/dL — ABNORMAL HIGH (ref 2.5–4.6)

## 2015-06-18 LAB — MRSA PCR SCREENING: MRSA by PCR: NEGATIVE

## 2015-06-18 LAB — GLUCOSE, CAPILLARY: Glucose-Capillary: 106 mg/dL — ABNORMAL HIGH (ref 65–99)

## 2015-06-18 MED ORDER — NITROGLYCERIN 0.4 MG SL SUBL
SUBLINGUAL_TABLET | SUBLINGUAL | Status: AC
  Filled 2015-06-18: qty 1

## 2015-06-18 MED ORDER — INSULIN ASPART 100 UNIT/ML ~~LOC~~ SOLN: 0.0000 [IU] | SUBCUTANEOUS | Status: DC

## 2015-06-18 MED ORDER — COLCHICINE 0.6 MG PO TABS
0.6000 mg | ORAL_TABLET | ORAL | Status: DC
  Administered 2015-06-19: 0.6 mg via ORAL
  Filled 2015-06-18: qty 1

## 2015-06-18 MED ORDER — TORSEMIDE 20 MG PO TABS
80.0000 mg | ORAL_TABLET | Freq: Every day | ORAL | Status: DC
  Administered 2015-06-18 – 2015-06-19 (×2): 80 mg via ORAL
  Filled 2015-06-18 (×2): qty 4

## 2015-06-18 MED ORDER — REGADENOSON 0.4 MG/5ML IV SOLN
0.4000 mg | Freq: Once | INTRAVENOUS | Status: AC
  Administered 2015-06-18: 0.4 mg via INTRAVENOUS
  Filled 2015-06-18: qty 5

## 2015-06-18 MED ORDER — DIPHENHYDRAMINE HCL 25 MG PO CAPS
25.0000 mg | ORAL_CAPSULE | Freq: Once | ORAL | Status: AC
  Administered 2015-06-18: 25 mg via ORAL
  Filled 2015-06-18: qty 1

## 2015-06-18 MED ORDER — CALCIUM ACETATE (PHOS BINDER) 667 MG PO CAPS
1334.0000 mg | ORAL_CAPSULE | Freq: Three times a day (TID) | ORAL | Status: DC
  Administered 2015-06-18 – 2015-06-19 (×2): 1334 mg via ORAL
  Filled 2015-06-18 (×3): qty 2

## 2015-06-18 MED ORDER — ACETAMINOPHEN 650 MG RE SUPP: 650.0000 mg | Freq: Four times a day (QID) | RECTAL | Status: DC | PRN

## 2015-06-18 MED ORDER — MAGNESIUM OXIDE 400 (241.3 MG) MG PO TABS
400.0000 mg | ORAL_TABLET | Freq: Every day | ORAL | Status: DC
  Administered 2015-06-18 – 2015-06-19 (×2): 400 mg via ORAL
  Filled 2015-06-18 (×2): qty 1

## 2015-06-18 MED ORDER — REGADENOSON 0.4 MG/5ML IV SOLN
INTRAVENOUS | Status: AC
  Filled 2015-06-18: qty 5

## 2015-06-18 MED ORDER — PROMETHAZINE HCL 25 MG/ML IJ SOLN
12.5000 mg | Freq: Once | INTRAMUSCULAR | Status: AC
  Administered 2015-06-18: 12.5 mg via INTRAVENOUS
  Filled 2015-06-18: qty 1

## 2015-06-18 MED ORDER — SODIUM CHLORIDE 0.9% FLUSH
3.0000 mL | Freq: Two times a day (BID) | INTRAVENOUS | Status: DC
  Administered 2015-06-18 (×3): 3 mL via INTRAVENOUS

## 2015-06-18 MED ORDER — METOPROLOL TARTRATE 25 MG PO TABS
25.0000 mg | ORAL_TABLET | Freq: Two times a day (BID) | ORAL | Status: DC
  Administered 2015-06-18 – 2015-06-19 (×2): 25 mg via ORAL
  Filled 2015-06-18 (×3): qty 1

## 2015-06-18 MED ORDER — HYDROMORPHONE HCL 1 MG/ML IJ SOLN
1.0000 mg | Freq: Once | INTRAMUSCULAR | Status: AC
  Administered 2015-06-18: 1 mg via INTRAVENOUS
  Filled 2015-06-18: qty 1

## 2015-06-18 MED ORDER — ONDANSETRON HCL 4 MG PO TABS: 4.0000 mg | ORAL_TABLET | Freq: Four times a day (QID) | ORAL | Status: DC | PRN

## 2015-06-18 MED ORDER — MORPHINE SULFATE (PF) 2 MG/ML IV SOLN
2.0000 mg | INTRAVENOUS | Status: DC | PRN
Start: 2015-06-18 — End: 2015-06-19
  Administered 2015-06-18: 2 mg via INTRAVENOUS
  Filled 2015-06-18: qty 1

## 2015-06-18 MED ORDER — SODIUM CHLORIDE 0.9 % IV SOLN
Freq: Once | INTRAVENOUS | Status: AC
  Administered 2015-06-18: 03:00:00 via INTRAVENOUS

## 2015-06-18 MED ORDER — ACETAMINOPHEN 325 MG PO TABS: 650.0000 mg | ORAL_TABLET | Freq: Four times a day (QID) | ORAL | Status: DC | PRN

## 2015-06-18 MED ORDER — HYDROCODONE-ACETAMINOPHEN 5-325 MG PO TABS: 1.0000 | ORAL_TABLET | ORAL | Status: DC | PRN

## 2015-06-18 MED ORDER — ATORVASTATIN CALCIUM 40 MG PO TABS
40.0000 mg | ORAL_TABLET | Freq: Every day | ORAL | Status: DC
  Administered 2015-06-18 – 2015-06-19 (×2): 40 mg via ORAL
  Filled 2015-06-18 (×2): qty 1

## 2015-06-18 MED ORDER — ASPIRIN 81 MG PO CHEW
81.0000 mg | CHEWABLE_TABLET | Freq: Every day | ORAL | Status: DC
  Administered 2015-06-18 – 2015-06-19 (×2): 81 mg via ORAL
  Filled 2015-06-18 (×2): qty 1

## 2015-06-18 MED ORDER — ENOXAPARIN SODIUM 30 MG/0.3ML ~~LOC~~ SOLN
30.0000 mg | SUBCUTANEOUS | Status: DC
  Administered 2015-06-18 – 2015-06-19 (×2): 30 mg via SUBCUTANEOUS
  Filled 2015-06-18 (×2): qty 0.3

## 2015-06-18 MED ORDER — ONDANSETRON HCL 4 MG/2ML IJ SOLN
4.0000 mg | Freq: Four times a day (QID) | INTRAMUSCULAR | Status: DC | PRN
  Administered 2015-06-18: 4 mg via INTRAVENOUS
  Filled 2015-06-18: qty 2

## 2015-06-18 MED ORDER — ACETAMINOPHEN 325 MG PO TABS
650.0000 mg | ORAL_TABLET | Freq: Once | ORAL | Status: AC
  Administered 2015-06-18: 650 mg via ORAL
  Filled 2015-06-18: qty 2

## 2015-06-18 MED ORDER — PANTOPRAZOLE SODIUM 40 MG PO TBEC
40.0000 mg | DELAYED_RELEASE_TABLET | Freq: Every day | ORAL | Status: DC
  Administered 2015-06-18 – 2015-06-19 (×2): 40 mg via ORAL
  Filled 2015-06-18 (×2): qty 1

## 2015-06-18 MED ORDER — ALLOPURINOL 100 MG PO TABS
100.0000 mg | ORAL_TABLET | Freq: Every day | ORAL | Status: DC
  Administered 2015-06-18 – 2015-06-19 (×2): 100 mg via ORAL
  Filled 2015-06-18 (×2): qty 1

## 2015-06-18 MED ORDER — SODIUM CHLORIDE 0.9 % IV SOLN: Freq: Once | INTRAVENOUS | Status: AC

## 2015-06-18 MED ORDER — GABAPENTIN 300 MG PO CAPS
300.0000 mg | ORAL_CAPSULE | Freq: Two times a day (BID) | ORAL | Status: DC
  Administered 2015-06-18 – 2015-06-19 (×4): 300 mg via ORAL
  Filled 2015-06-18 (×4): qty 1

## 2015-06-18 NOTE — Consult Note (Signed)
CARDIOLOGY CONSULT NOTE   Patient ID: Patricia [name] MRN: [id number] DOB/AGE: [birth year] 42 y.o.  Admit date: 06/17/2015  Primary Physician   Tereasa Coop, PA-C Primary Cardiologist: Dr. Sallyanne Kuster Requesting MD: Dr. Charlies Silvers Reason for Consultation: Chest pain  HPI: Patricia [name] is a 40 year old female with a past medical history of HTN, HLD, DM, and CKD stage III.   She was at home yesterday lying down and developed acute left sided chest pain that radiated to her left arm. She attempted to lie on her left side and the pain worsened. She then got up and decided she would drive to the hospital, but felt weak, diaphoretic and SOB so she called EMS. She describes her pain as substernal pressure, starts in the center of her chest and moves to the left of her chest and down her left arm.   Upon arrival to the ED she was still having pain, but it eventually resolved after 3 SL Nitro. She has never had any pain like this before. However she does say she underwent a stress test "a few years ago" that was normal. This was in [employer]. She also states that she had a cardiac cath over 15 years ago that she said was normal and she did not receive stents.   She does not smoke, drinks "socially" and denies illicit drug use. Her father has a history of MI and has stents. No other history of CAD in her family. Troponin is negative x1. EKG shows NSR, not concerning for ischemia.    Past Medical History  Diagnosis Date  . Hypertension   . Acid reflux   . High cholesterol   . History of blood transfusion "a couple"    "related to low counts"  . Headache(784.0)     "related to chemo; sometimes weekly" (09/12/2013)  . Type II diabetes mellitus ([place])     "took me off my RX ~ 04/2013"  . Iron deficiency anemia     "get epogen shots q month" (02/20/2014)  . Chronic kidney disease (CKD), stage III (moderate)   . MCGN (minimal change glomerulonephritis)     "using chemo to tx;  finished my last  tx in 12/2013"     Past Surgical History  Procedure Laterality Date  . Ankle fracture surgery Right 1994  . Abdominal hysterectomy  2010    "laparoscopic"  . Fracture surgery    . Cardiac catheterization  2000's    Allergies  Allergen Reactions  . Nsaids Other (See Comments)    Cannot take due to Kidney disease    I have reviewed the patient's current medications . allopurinol  100 mg Oral Daily  . aspirin  81 mg Oral Daily  . atorvastatin  40 mg Oral Daily  . calcium acetate  1,334 mg Oral TID WC  . [START ON 06/19/2015] colchicine  0.6 mg Oral QODAY  . enoxaparin (LOVENOX) injection  30 mg Subcutaneous Q24H  . gabapentin  300 mg Oral BID  . insulin aspart  0-9 Units Subcutaneous Q4H  . magnesium oxide  400 mg Oral Daily  . metoprolol tartrate  25 mg Oral BID  . pantoprazole  40 mg Oral Daily  . sodium chloride flush  3 mL Intravenous Q12H  . torsemide  80 mg Oral Daily     acetaminophen **OR** acetaminophen, HYDROcodone-acetaminophen, morphine injection, nitroGLYCERIN, ondansetron **OR** ondansetron (ZOFRAN) IV  Prior to Admission medications   Medication Sig Start Date End Date Taking? Authorizing  Provider  allopurinol (ZYLOPRIM) 100 MG tablet Take 100 mg by mouth daily.   Yes Historical Provider, MD  amLODipine (NORVASC) 5 MG tablet Take 5 mg by mouth daily. 08/12/14  Yes Historical Provider, MD  aspirin 81 MG chewable tablet Chew 1 tablet (81 mg total) by mouth daily. 10/03/13  Yes Rodolph Bong, MD  atorvastatin (LIPITOR) 40 MG tablet Take 40 mg by mouth daily. 03/14/14  Yes Historical Provider, MD  calcium acetate (PHOSLO) 667 MG tablet Take 1,334 mg by mouth 3 (three) times daily. 08/02/14 08/02/15 Yes Historical Provider, MD  Cholecalciferol (VITAMIN D-1000 MAX ST) 1000 UNITS tablet Take 1,000 Units by mouth daily. 08/02/14 08/02/15 Yes Historical Provider, MD  colchicine 0.6 MG tablet Take 0.6 mg by mouth every other day. 10/30/14 10/30/15 Yes Historical Provider, MD    diphenhydramine-acetaminophen (TYLENOL PM) 25-500 MG TABS tablet Take 2 tablets by mouth at bedtime as needed. For sleep   Yes Historical Provider, MD  gabapentin (NEURONTIN) 300 MG capsule Take 300 mg by mouth 2 (two) times daily. 06/12/15 06/11/16 Yes Historical Provider, MD  lisinopril (PRINIVIL,ZESTRIL) 10 MG tablet Take 10 mg by mouth daily.   Yes Historical Provider, MD  magnesium oxide (MAG-OX) 400 MG tablet Take 1 tablet (400 mg total) by mouth daily. 04/29/15  Yes Maryruth Bun Rama, MD  metolazone (ZAROXOLYN) 2.5 MG tablet Take 1 tablet (2.5 mg total) by mouth daily as needed (for leg swellings or shortness of breath). Patient taking differently: Take 2.5 mg by mouth daily.  04/12/14  Yes Nishant Dhungel, MD  metoprolol tartrate (LOPRESSOR) 25 MG tablet Take 1 tablet (25 mg total) by mouth 2 (two) times daily. 10/03/13  Yes Rodolph Bong, MD  Multiple Vitamin (MULTIVITAMIN WITH MINERALS) TABS tablet Take 1 tablet by mouth daily.   Yes Historical Provider, MD  omeprazole (PRILOSEC) 40 MG capsule Take 40 mg by mouth daily.     Yes Historical Provider, MD  potassium chloride (K-DUR) 10 MEQ tablet Take 10 mEq by mouth daily. 08/12/14  Yes Historical Provider, MD  promethazine (PHENERGAN) 25 MG tablet Take 1 tablet (25 mg total) by mouth every 6 (six) hours as needed for nausea or vomiting. 01/23/15  Yes Arthor Captain, PA-C  torsemide (DEMADEX) 20 MG tablet Take 2 tablets (40 mg total) by mouth daily. Patient taking differently: Take 80 mg by mouth daily.  04/12/14  Yes Nishant Dhungel, MD  meclizine (ANTIVERT) 25 MG tablet Take 1 tablet (25 mg total) by mouth 3 (three) times daily as needed for dizziness. Patient not taking: Reported on 05/29/2015 04/25/15   Dione Booze, MD  traZODone (DESYREL) 50 MG tablet Take 50 mg by mouth at bedtime as needed for sleep.     Historical Provider, MD     Social History   Social History  . Marital Status: Single    Spouse Name: N/A  . Number of Children: N/A  .  Years of Education: N/A   Occupational History  . Not on file.   Social History Main Topics  . Smoking status: Never Smoker   . Smokeless tobacco: Never Used  . Alcohol Use: No  . Drug Use: No  . Sexual Activity: Not Currently    Birth Control/ Protection: Surgical   Other Topics Concern  . Not on file   Social History Narrative    No family status information on file.   Family History  Problem Relation Age of Onset  . Hypertension Mother   . Thyroid disease  Mother   . Coronary artery disease Father   . Hypertension Father   . Diabetes Father      ROS:  Full 14 point review of systems complete and found to be negative unless listed above.  Physical Exam: Blood pressure 117/63, pulse 66, temperature 97.8 F (36.6 C), temperature source Oral, resp. rate 18, height $RemoveBe'5\' 7"'CFQkkaRfw$  (1.702 m), weight 248 lb 12.8 oz (112.855 kg), SpO2 100 %.  General: Well developed, well nourished, female in no acute distress Head: Eyes PERRLA, No xanthomas.   Normocephalic and atraumatic, oropharynx without edema or exudate.  Lungs: CTA Heart: HRRR S1 S2, no rub/gallop, No murmur. pulses are 2+ extrem.   Neck: No carotid bruits. No lymphadenopathy.  No JVD. Abdomen: Bowel sounds present, abdomen soft and non-tender without masses or hernias noted. Msk:  No spine or cva tenderness. No weakness, no joint deformities or effusions. Extremities: No clubbing or cyanosis. no edema.  Neuro: Alert and oriented X 3. No focal deficits noted. Psych:  Good affect, responds appropriately Skin: No rashes or lesions noted.  Labs:   Lab Results  Component Value Date   WBC 5.6 06/17/2015   HGB 7.8* 06/17/2015   HCT 25.4* 06/17/2015   MCV 77.9* 06/17/2015   PLT 339 06/17/2015    Recent Labs  06/18/15 0113  INR 0.98    Recent Labs Lab 06/17/15 2106  NA 137  K 3.9  CL 105  CO2 23  BUN 60*  CREATININE 3.06*  CALCIUM 7.8*  GLUCOSE 104*   MAGNESIUM  Date Value Ref Range Status  06/18/2015 1.7  1.7 - 2.4 mg/dL Final    Recent Labs  06/18/15 0057  TROPONINI <0.03    Recent Labs  06/17/15 2114  TROPIPOC 0.00   B NATRIURETIC PEPTIDE  Date/Time Value Ref Range Status  06/28/2014 10:33 PM 67.5 0.0 - 100.0 pg/mL Final  06/27/2014 10:37 PM 130.8* 0.0 - 100.0 pg/mL Final   Echo:  pending  ECG: NSR  Radiology:  Dg Chest 2 View  06/17/2015  CLINICAL DATA:  Acute onset of left-sided chest pain radiating to the left arm. Shortness of breath. Initial encounter. EXAM: CHEST  2 VIEW COMPARISON:  Chest radiograph from 05/29/2015 FINDINGS: The lungs are well-aerated. Mild peribronchial thickening is noted. There is no evidence of focal opacification, pleural effusion or pneumothorax. The heart is normal in size; the mediastinal contour is within normal limits. No acute osseous abnormalities are seen. IMPRESSION: Mild peribronchial thickening noted.  Lungs otherwise clear. Electronically Signed   By: Garald Balding M.D.   On: 06/17/2015 21:40    ASSESSMENT AND PLAN:    Active Problems:   HTN (hypertension)   DM (diabetes mellitus) (HCC)   Symptomatic anemia   CKD stage 3 followed at [facility]   PAT (paroxysmal atrial tachycardia) (HCC)   Dyslipidemia   Chest pain   Atypical chest pain  1. Chest pain: patient presents with chest pain with radiation to left arm with associated SOB and diaphoresis. Pain was relieved by 3 SL Nitro tablets. EKG is not concering for ischemia, so far troponin is negative. She does have risk factors for CAD including HTN and family history. She would benefit from [facility] today. We will obtain a 2 day study to achieve the best accuracy as if she has an abnormal result she will need cath and her renal function is significantly impaired.   2. DM: A1C pending. Says she does not take any medications for DM. Will await A1C  results. Management per primary team.   3. HLD: Lipid panel in process. Will follow. She is on moderate dose statin.   4. HTN: Well  controlled with amlodipine and metoprolol. Her home lisinopril is on hold in setting of CKD.   5. CKD stage III: Cr is 3.06, she is followed by Nephrology at [facility]. She tells me that 2 months ago she was evaluated for access for dialysis, however she does not currently have access for HD, and that it will be considered in the future if she needs it. We will have to consider this should her stress test have an abnormal result.    Signed: Arbutus Leas, NP 06/18/2015 7:36 AM Pager [phone]  Co-Sign MD  Attending Note:   The patient was seen and examined.  Agree with assessment and plan as noted above.  Changes made to the above note as needed.  Patient seen and independently examined with Jettie Booze, NP.   We discussed all aspects of the encounter. I agree with the assessment and plan as stated above.  Pt has CP  - has several risk factors - CKD, DM, HTN, obesity. ECG is unremarkable. Troponins are negative.  Will get a 2 day stress myoview. If is very important that we get the very best images possible.   Her weight is 248 - with the weight concentrated around her chest and abdomen. Her creatinine is 3.0 and we would like to avoid cath unless her Brantley Fling shows a definite abnormality .    I have spent a total of 40 minutes with patient reviewing hospital  notes , telemetry, EKGs, labs and examining patient as well as establishing an assessment and plan that was discussed with the patient. > 50% of time was spent in direct patient care.  Anticipate Dc tomorrow if her Brantley Fling is low risk   Ramond Dial., MD, [facility] 06/18/2015, 8:44 AM [address],  [place] Pager [phone]

## 2015-06-18 NOTE — Progress Notes (Signed)
Lexiscan MV performed, 2 day study, [facility] to read.  Rosaria Ferries, Hershal Coria 06/18/2015 11:24 AM Beeper [phone]

## 2015-06-18 NOTE — Progress Notes (Signed)
Patient ID: Patricia [name], female   DOB: [birth year], 40 y.o.   MRN: [id number]  PROGRESS NOTE    Patricia [name]  XYI:[id number] DOB: 01-Feb-1975 DOA: 06/17/2015  PCP: Tereasa Coop, PA-C   Brief Narrative:   Patient admitted after midnight. Please refer to admission note done 06/18/2015 for further details.  40 year old female with a past medical history of hypertension, dyslipidemia, chronic kidney disease stage IV with baseline creatinine 3.53. Patient presented to [facility] with acute left-sided chest pain, radiating to left arm, started at rest. Patient reported associated weakness, diaphoresis and shortness of breath. In ED, patient continued to experience chest pain but this has resolved with 3 sublingual nitroglycerin. Troponin level was WNL. Patient was seen by cardiology in consultation and plan is for Myoview study today.  Assessment & Plan:   Left side chest pain  - Troponin levels WNL - No acute ischemic changes on 12-lead EKG - Patient does have risk factors for CAD including hypertension and family history - Plan for Myoview study today, this will probably be 2 day study so patient will most likely stay in hospital overnight - Appreciate cardiology following and recommendations   DVT prophylaxis: Lovenox subQ Code Status: full code  Family Communication: no family at the bedside this am Disposition Plan: home likely tomorrow after Myoview study completed   Consultants:   Cardiology   Procedures:   Myoview 06/18/2015  Antimicrobials:   None    Subjective: Still has some chest pain this am.  Objective: Filed Vitals:   06/18/15 0250 06/18/15 0409 06/18/15 0522 06/18/15 0824  BP: 112/59 115/66 117/63 114/57  Pulse: 62 70 66 59  Temp: 98.1 F (36.7 C) 98 F (36.7 C) 97.8 F (36.6 C) 97.6 F (36.4 C)  TempSrc: Oral Oral Oral Oral  Resp: $Remo'15 18  18  'rYVXz$ Height:      Weight:      SpO2: 100% 99% 100% 100%    Intake/Output Summary (Last 24  hours) at 06/18/15 1033 Last data filed at 06/18/15 0824  Gross per 24 hour  Intake    345 ml  Output   2150 ml  Net  -1805 ml   Filed Weights   06/18/15 0150  Weight: 112.855 kg (248 lb 12.8 oz)    Examination:  General exam: Appears calm and comfortable, obese  Respiratory system: Clear to auscultation. Respiratory effort normal. Cardiovascular system: S1 & S2 heard, RRR.  Gastrointestinal system: Abdomen is nondistended, soft and nontender. No organomegaly or masses felt. Normal bowel sounds heard. Central nervous system: Alert and oriented. No focal neurological deficits. Extremities: Trace LE edema, palpable pulses  Skin: No rashes, lesions or ulcers Psychiatry: Judgement and insight appear normal. Mood & affect appropriate.   Data Reviewed: I have personally reviewed following labs and imaging studies  CBC:  Recent Labs Lab 06/17/15 2106 06/18/15 0708  WBC 5.6 5.0  HGB 7.8* 8.5*  HCT 25.4* 27.8*  MCV 77.9* 79.4  PLT 339 270   Basic Metabolic Panel:  Recent Labs Lab 06/17/15 2106 06/18/15 0057 06/18/15 0708  NA 137  --  137  K 3.9  --  3.7  CL 105  --  104  CO2 23  --  24  GLUCOSE 104*  --  92  BUN 60*  --  59*  CREATININE 3.06*  --  3.00*  CALCIUM 7.8*  --  7.9*  MG  --  1.7 1.7  PHOS  --  4.8* 5.0*   GFR:  Estimated Creatinine Clearance: 32 mL/min (by C-G formula based on Cr of 3). Liver Function Tests:  Recent Labs Lab 06/18/15 0708  AST 20  ALT 14  ALKPHOS 30*  BILITOT 0.4  PROT 5.5*  ALBUMIN 2.7*   No results for input(s): LIPASE, AMYLASE in the last 168 hours. No results for input(s): AMMONIA in the last 168 hours. Coagulation Profile:  Recent Labs Lab 06/18/15 0113  INR 0.98   Cardiac Enzymes:  Recent Labs Lab 06/18/15 0057 06/18/15 0708  TROPONINI <0.03 <0.03   BNP (last 3 results) No results for input(s): PROBNP in the last 8760 hours. HbA1C: No results for input(s): HGBA1C in the last 72 hours. CBG:  Recent  Labs Lab 06/18/15 0308  GLUCAP 106*   Lipid Profile:  Recent Labs  06/18/15 0708  CHOL 196  HDL 34*  LDLCALC 109*  TRIG 265*  CHOLHDL 5.8   Thyroid Function Tests:  Recent Labs  06/18/15 0708  TSH 1.973   Anemia Panel: No results for input(s): VITAMINB12, FOLATE, FERRITIN, TIBC, IRON, RETICCTPCT in the last 72 hours. Urine analysis:    Component Value Date/Time   COLORURINE STRAW* 01/23/2015 House 01/23/2015 0455   LABSPEC 1.008 01/23/2015 0455   PHURINE 5.5 01/23/2015 0455   GLUCOSEU NEGATIVE 01/23/2015 0455   HGBUR TRACE* 01/23/2015 0455   BILIRUBINUR NEGATIVE 01/23/2015 0455   KETONESUR NEGATIVE 01/23/2015 0455   PROTEINUR 100* 01/23/2015 0455   UROBILINOGEN 0.2 09/19/2014 1620   NITRITE NEGATIVE 01/23/2015 0455   LEUKOCYTESUR NEGATIVE 01/23/2015 0455   Sepsis Labs: $RemoveBefo'@LABRCNTIP'HLpnwXQLJkO$ (WGNFAOZHYQMVH:8,IONGEXBMWUX:3)   Recent Results (from the past 240 hour(s))  MRSA PCR Screening     Status: None   Collection Time: 06/18/15  1:50 AM  Result Value Ref Range Status   MRSA by PCR NEGATIVE NEGATIVE Final    Comment:        The GeneXpert MRSA Assay (FDA approved for NASAL specimens only), is one component of a comprehensive MRSA colonization surveillance program. It is not intended to diagnose MRSA infection nor to guide or monitor treatment for MRSA infections.       Radiology Studies: Dg Chest 2 View 06/17/2015   Mild peribronchial thickening noted.  Lungs otherwise clear.    Scheduled Meds: . allopurinol  100 mg Oral Daily  . aspirin  81 mg Oral Daily  . atorvastatin  40 mg Oral Daily  . calcium acetate  1,334 mg Oral TID WC  . [START ON 06/19/2015] colchicine  0.6 mg Oral QODAY  . enoxaparin (LOVENOX) injection  30 mg Subcutaneous Q24H  . gabapentin  300 mg Oral BID  . insulin aspart  0-9 Units Subcutaneous Q4H  . magnesium oxide  400 mg Oral Daily  . metoprolol tartrate  25 mg Oral BID  . pantoprazole  40 mg Oral Daily  .  sodium chloride flush  3 mL Intravenous Q12H  . torsemide  80 mg Oral Daily   Continuous Infusions:    LOS: 0 days    Time spent: 25 minutes  Greater than 50% of the time spent on counseling and coordinating the care.   Leisa Lenz, MD [facility] Pager [phone]  If 7PM-7AM, please contact night-coverage [email or web address] Password [facility] 06/18/2015, 10:33 AM

## 2015-06-18 NOTE — H&P (Signed)
Patricia [name] [id number]:[id number] DOB: 08/24/1975 DOA: 06/17/2015     PCP: Hillis Range   Outpatient Specialists: Nephrology at [facility]  Patient coming from: home Lives  With family    Chief Complaint: Chest pain  HPI: Patricia [name] is a 40 y.o. 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 Started at 7:30 PM.  At first she noted pain but once she got p to walk she felt pressure she was diaphoretic and nauseous..  Worse with laying down on the left side she attempted to take Tums but it did not make it better she called 911 was given in route 324 mg of aspirin nitroglycerin 3 and 4 mg Zofran her pain has decreased from 8-10 out to 6 out 10. This was associated diaphoresis nausea pain improved Heaviness is persistent. Taking deep breath does not change the nature of the pain, minimal leg swelling, no travel hx No blood stool. No black stools  Regarding pertinent Chronic problems: Chronic kidney disease followed by [facility] centering around 2.8 She's been having recurrent chest pain and has had evaluation for that by the bowel cardiology in 2012 Myoview was negative at time She has had admissions for anemia requiring transfusion she gets Neupogen shots once a month Proximal atrial fibrillation was noted while on telemetry she was started on beta blocker Last time she was admitted for hypomagnesemia in April   IN ER: Afebrile heart rate 64 blood pressure 127/87 WBC 5.6 hemoglobin 7.8 creatinine 3.0 Calcium 7.8 ECG showed lateral leads T wave inversions improved after nitro administration Chest/MI peribronchial thickening otherwise clear  Hospitalist was called for admission for chest pain  Review of  Systems:    Pertinent positives include: shortness of breath at rest. chest pain, nausea,  Constitutional:  No weight loss, night sweats, Fevers, chills, fatigue, weight loss  HEENT:  No headaches, Difficulty swallowing,Tooth/dental problems,Sore throat,  No sneezing, itching, ear ache, nasal congestion, post nasal drip,  Cardio-vascular:  No Orthopnea, PND, anasarca, dizziness, palpitations.no Bilateral lower extremity swelling  GI:  No heartburn, indigestion, abdominal pain,  vomiting, diarrhea, change in bowel habits, loss of appetite, melena, blood in stool, hematemesis Resp:  No dyspnea on exertion, No excess mucus, no productive cough, No non-productive cough, No coughing up of blood.No change in color of mucus.No wheezing. Skin:  no rash or lesions. No jaundice GU:  no dysuria, change in color of urine, no urgency or frequency. No straining to urinate.  No flank pain.  Musculoskeletal:  No joint pain or no joint swelling. No decreased range of motion. No back pain.  Psych:  No change in mood or affect. No depression or anxiety. No memory loss.  Neuro: no localizing neurological complaints, no tingling, no weakness, no double vision, no gait abnormality, no slurred speech, no confusion  As per HPI otherwise 10 point review of systems negative.   Past Medical History: Past Medical History  Diagnosis Date  . Hypertension   . Acid reflux   . High cholesterol   . History of blood transfusion "a couple"    "related to low counts"  . Headache(784.0)     "related to chemo; sometimes weekly" (09/12/2013)  . Type II diabetes mellitus ([place])     "took me off my  RX ~ 04/2013"  . Iron deficiency anemia     "get epogen shots q month" (02/20/2014)  . Chronic kidney disease (CKD), stage III (moderate)   . 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 Ankle fracture surgery Right  1994  . Abdominal hysterectomy  2010    "laparoscopic"  . Fracture surgery    . Cardiac catheterization  2000's     Social History:  Ambulatory   independently      reports that she has never smoked. She has never used smokeless tobacco. She reports that she does not drink alcohol or use illicit drugs.  Allergies:   Allergies  Allergen Reactions  . Nsaids Other (See Comments)    Cannot take due to Kidney disease     Family History:    Family History  Problem Relation Age of Onset  . Hypertension Mother   . Thyroid disease Mother   . Coronary artery disease Father   . Hypertension Father   . Diabetes Father     Medications: Prior to Admission medications   Medication Sig Start Date End Date Taking? Authorizing Provider  allopurinol (ZYLOPRIM) 100 MG tablet Take 100 mg by mouth daily.   Yes Historical Provider, MD  amLODipine (NORVASC) 5 MG tablet Take 5 mg by mouth daily. 08/12/14  Yes Historical Provider, MD  aspirin 81 MG chewable tablet Chew 1 tablet (81 mg total) by mouth daily. 10/03/13  Yes Eugenie Filler, MD  atorvastatin (LIPITOR) 40 MG tablet Take 40 mg by mouth daily. 03/14/14  Yes Historical Provider, MD  calcium acetate (PHOSLO) 667 MG tablet Take 1,334 mg by mouth 3 (three) times daily. 08/02/14 08/02/15 Yes Historical Provider, MD  Cholecalciferol (VITAMIN D-1000 MAX ST) 1000 UNITS tablet Take 1,000 Units by mouth daily. 08/02/14 08/02/15 Yes Historical Provider, MD  colchicine 0.6 MG tablet Take 0.6 mg by mouth every other day. 10/30/14 10/30/15 Yes Historical Provider, MD  diphenhydramine-acetaminophen (TYLENOL PM) 25-500 MG TABS tablet Take 2 tablets by mouth at bedtime as needed. For sleep   Yes Historical Provider, MD  gabapentin (NEURONTIN) 300 MG capsule Take 300 mg by mouth 2 (two) times daily. 06/12/15 06/11/16 Yes Historical Provider, MD  lisinopril (PRINIVIL,ZESTRIL) 10 MG tablet Take 10 mg by mouth daily.   Yes Historical Provider, MD  magnesium oxide  (MAG-OX) 400 MG tablet Take 1 tablet (400 mg total) by mouth daily. 04/29/15  Yes Venetia Maxon Rama, MD  metolazone (ZAROXOLYN) 2.5 MG tablet Take 1 tablet (2.5 mg total) by mouth daily as needed (for leg swellings or shortness of breath). Patient taking differently: Take 2.5 mg by mouth daily.  04/12/14  Yes Nishant Dhungel, MD  metoprolol tartrate (LOPRESSOR) 25 MG tablet Take 1 tablet (25 mg total) by mouth 2 (two) times daily. 10/03/13  Yes Eugenie Filler, MD  Multiple Vitamin (MULTIVITAMIN WITH MINERALS) TABS tablet Take 1 tablet by mouth daily.   Yes Historical Provider, MD  omeprazole (PRILOSEC) 40 MG capsule Take 40 mg by mouth daily.     Yes Historical Provider, MD  potassium chloride (K-DUR) 10 MEQ tablet Take 10 mEq by mouth daily. 08/12/14  Yes Historical Provider, MD  promethazine (PHENERGAN) 25 MG tablet Take 1 tablet (25 mg total) by mouth every 6 (six) hours as needed for nausea or vomiting. 01/23/15  Yes Margarita Mail, PA-C  torsemide (DEMADEX) 20  MG tablet Take 2 tablets (40 mg total) by mouth daily. Patient taking differently: Take 80 mg by mouth daily.  04/12/14  Yes Nishant Dhungel, MD  meclizine (ANTIVERT) 25 MG tablet Take 1 tablet (25 mg total) by mouth 3 (three) times daily as needed for dizziness. Patient not taking: Reported on 05/29/2015 5/78/46   Delora Fuel, MD  traZODone (DESYREL) 50 MG tablet Take 50 mg by mouth at bedtime as needed for sleep.     Historical Provider, MD    Physical Exam: Patient Vitals for the past 24 hrs:  BP Temp Temp src Pulse Resp SpO2  06/18/15 0015 127/87 mmHg - - 64 15 100 %  06/17/15 2315 122/65 mmHg - - 65 15 100 %  06/17/15 2230 111/76 mmHg - - 65 17 100 %  06/17/15 2229 111/76 mmHg 97.7 F (36.5 C) Oral 65 17 100 %  06/17/15 2228 130/82 mmHg - - - - -  06/17/15 2115 110/72 mmHg - - 70 17 100 %  06/17/15 2100 137/78 mmHg - - 73 19 100 %  06/17/15 2057 - - - 71 15 100 %  06/17/15 2052 121/80 mmHg 98.7 F (37.1 C) Oral 71 16 100 %    06/17/15 2050 121/80 mmHg - - - - -    1. General:  in No Acute distress 2. Psychological: Alert and   Oriented 3. Head/ENT:     Dry Mucous Membranes                          Head Non traumatic, neck supple                          Normal   Dentition 4. SKIN: normal  Skin turgor,  Skin clean Dry and intact no rash 5. Heart: Regular rate and rhythm no Murmur, Rub or gallop 6. Lungs:  no wheezes or crackles   7. Abdomen: Soft, non-tender, Non distended 8. Lower extremities: no clubbing, cyanosis, or edema 9. Neurologically Grossly intact, moving all 4 extremities equally 10. MSK: Normal range of motion   body mass index is unknown because there is no weight on file.  Labs on Admission:   Labs on Admission: I have personally reviewed following labs and imaging studies  CBC:  Recent Labs Lab 06/17/15 2106  WBC 5.6  HGB 7.8*  HCT 25.4*  MCV 77.9*  PLT 962   Basic Metabolic Panel:  Recent Labs Lab 06/17/15 2106  NA 137  K 3.9  CL 105  CO2 23  GLUCOSE 104*  BUN 60*  CREATININE 3.06*  CALCIUM 7.8*   GFR: CrCl cannot be calculated (Unknown ideal weight.). Liver Function Tests: No results for input(s): AST, ALT, ALKPHOS, BILITOT, PROT, ALBUMIN in the last 168 hours. No results for input(s): LIPASE, AMYLASE in the last 168 hours. No results for input(s): AMMONIA in the last 168 hours. Coagulation Profile: No results for input(s): INR, PROTIME in the last 168 hours. Cardiac Enzymes: No results for input(s): CKTOTAL, CKMB, CKMBINDEX, TROPONINI in the last 168 hours. BNP (last 3 results) No results for input(s): PROBNP in the last 8760 hours. HbA1C: No results for input(s): HGBA1C in the last 72 hours. CBG: No results for input(s): GLUCAP in the last 168 hours. Lipid Profile: No results for input(s): CHOL, HDL, LDLCALC, TRIG, CHOLHDL, LDLDIRECT in the last 72 hours. Thyroid Function Tests: No results for input(s): TSH, T4TOTAL, FREET4, T3FREE, THYROIDAB  in the  last 72 hours. Anemia Panel: No results for input(s): VITAMINB12, FOLATE, FERRITIN, TIBC, IRON, RETICCTPCT in the last 72 hours. Urine analysis:    Component Value Date/Time   COLORURINE STRAW* 01/23/2015 Sam Rayburn 01/23/2015 0455   LABSPEC 1.008 01/23/2015 0455   PHURINE 5.5 01/23/2015 0455   GLUCOSEU NEGATIVE 01/23/2015 0455   HGBUR TRACE* 01/23/2015 0455   BILIRUBINUR NEGATIVE 01/23/2015 0455   [place] 01/23/2015 0455   PROTEINUR 100* 01/23/2015 0455   UROBILINOGEN 0.2 09/19/2014 1620   NITRITE NEGATIVE 01/23/2015 0455   LEUKOCYTESUR NEGATIVE 01/23/2015 0455   Sepsis Labs: $RemoveBefo'@[email or web address]'NinNNutnogV$ (procalcitonin:4,lacticidven:4) )No results found for this or any previous visit (from the past 240 hour(s)).     UA ordered  Lab Results  Component Value Date   HGBA1C 5.9* 02/20/2014    CrCl cannot be calculated (Unknown ideal weight.).  BNP (last 3 results) No results for input(s): PROBNP in the last 8760 hours.   ECG REPORT  Independently reviewed Rate: 69  Rhythm: NSR ST&T Change: No acute ischemic changes   QTC 465  There were no vitals filed for this visit.   Cultures:    Component Value Date/Time   SDES URINE, CLEAN CATCH 08/20/2014 2258   [place] ADDED 0459 08/21/14 08/20/2014 2258   CULT MULTIPLE SPECIES PRESENT, SUGGEST RECOLLECTION 08/20/2014 2258   REPTSTATUS 08/22/2014 FINAL 08/20/2014 2258     Radiological Exams on Admission: Dg Chest 2 View  06/17/2015  CLINICAL DATA:  Acute onset of left-sided chest pain radiating to the left arm. Shortness of breath. Initial encounter. EXAM: CHEST  2 VIEW COMPARISON:  Chest radiograph from 05/29/2015 FINDINGS: The lungs are well-aerated. Mild peribronchial thickening is noted. There is no evidence of focal opacification, pleural effusion or pneumothorax. The heart is normal in size; the mediastinal contour is within normal limits. No acute osseous abnormalities are seen. IMPRESSION: Mild  peribronchial thickening noted.  Lungs otherwise clear. Electronically Signed   By: Garald Balding M.D.   On: 06/17/2015 21:40    Chart has been reviewed    Assessment/Plan  40 y.o. 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 Chest pain Worrisome history extensive respiratory factors possible unstable angina. His demented ischemia from anemia We'll start by hence using. Will admit to step down cycle cardiac enzymes obtain echogram appreciate cardiology consult  . CKD stage 3 followed at [facility] creatinine from baseline stop lisinopril and monitor carefully  . Dyslipidemia continue statin check lipid panel  . HTN (hypertension) - hold Norvasc in case patient is having acute ischemia. Hold lisinopril continue metoprolol somewhat soft blood pressures  . PAT (paroxysmal atrial tachycardia) (HCC) - currently in sinus rhythm, this was an isolated event. Years ago. Had not had recurrence since. He has another event would need to. Assess for possibility of need for anticoagulation initiation for now continue aspirin  . Symptomatic anemia -  transfuse 1 unit   diabetes mellitus order sliding scale  Other plan as per orders.  DVT prophylaxis:     Code Status:  FULL CODE as per patient   Family Communication:   Family no at  Bedside    Disposition Plan:    To home once workup is complete  and patient is stable   Consults called: cardiology   Admission status:   obs    Level of care   SDU      [place] 06/18/2015, 1:23 AM    [facility]  Pager [phone]   after 2 AM please page floor coverage PA If 7AM-7PM, please contact the day  team taking care of the patient  [email or web address]  Password [email or web address]

## 2015-06-18 NOTE — Progress Notes (Signed)
Pt complaint 6/10 chest pain.  VSS and EKG obtained.  Pt wanted morphine IV instead of sublingual nitro.  Administered 2 mg IV morphine.  Will continue to closely monitor.

## 2015-06-19 ENCOUNTER — Ambulatory Visit (HOSPITAL_BASED_OUTPATIENT_CLINIC_OR_DEPARTMENT_OTHER): Payer: Medicaid Other

## 2015-06-19 DIAGNOSIS — E1122 Type 2 diabetes mellitus with diabetic chronic kidney disease: Secondary | ICD-10-CM | POA: Diagnosis not present

## 2015-06-19 DIAGNOSIS — R0789 Other chest pain: Secondary | ICD-10-CM | POA: Diagnosis not present

## 2015-06-19 DIAGNOSIS — R079 Chest pain, unspecified: Secondary | ICD-10-CM

## 2015-06-19 DIAGNOSIS — N183 Chronic kidney disease, stage 3 (moderate): Secondary | ICD-10-CM | POA: Diagnosis not present

## 2015-06-19 LAB — ECHOCARDIOGRAM COMPLETE
CHL CUP MV DEC (S): 183
CHL CUP STROKE VOLUME: 60 mL
EWDT: 183 ms
FS: 34 % (ref 28–44)
Height: 67 in
IV/PV OW: 1
LA ID, A-P, ES: 43 mm
LA diam index: 1.95 cm/m2
LA vol A4C: 72.4 ml
LA vol index: 40.7 mL/m2
LAVOL: 89.9 mL
LDCA: 3.8 cm2
LEFT ATRIUM END SYS DIAM: 43 mm
LV PW d: 12 mm — AB (ref 0.6–1.1)
LV SIMPSON'S DISK: 57
LV TDI E'LATERAL: 12.8
LV dias vol index: 47 mL/m2
LV sys vol index: 20 mL/m2
LVDIAVOL: 104 mL (ref 46–106)
LVELAT: 12.8 cm/s
LVOTD: 22 mm
LVSYSVOL: 45 mL — AB (ref 14–42)
MV pk E vel: 1.2 m/s
Reg peak vel: 252 cm/s
TAPSE: 29 mm
TDI e' medial: 9.14
TRMAXVEL: 252 cm/s
Weight: 3929.48 oz

## 2015-06-19 LAB — NM MYOCAR MULTI W/SPECT W/WALL MOTION / EF
CHL CUP NUCLEAR SSS: 11
CSEPED: 14 min
CSEPEW: 1 METS
CSEPPHR: 112 {beats}/min
Exercise duration (sec): 20 s
LHR: 0.11
LV dias vol: 94 mL (ref 46–106)
LVSYSVOL: 27 mL
MPHR: 179 {beats}/min
NUC STRESS TID: 0.86
Percent HR: 62 %
Rest HR: 62 {beats}/min
SDS: 5
SRS: 6

## 2015-06-19 LAB — HEMOGLOBIN A1C
HEMOGLOBIN A1C: 5.7 % — AB (ref 4.8–5.6)
Mean Plasma Glucose: 117 mg/dL

## 2015-06-19 LAB — CALCIUM, IONIZED: CALCIUM, IONIZED, SERUM: 4.4 mg/dL — AB (ref 4.5–5.6)

## 2015-06-19 MED ORDER — TECHNETIUM TC 99M TETROFOSMIN IV KIT
30.0000 | PACK | Freq: Once | INTRAVENOUS | Status: AC | PRN
  Administered 2015-06-19: 30 via INTRAVENOUS

## 2015-06-19 NOTE — Progress Notes (Signed)
Echocardiogram 2D Echocardiogram has been performed.  Patricia [name] 06/19/2015, 2:14 PM

## 2015-06-19 NOTE — Care Management Note (Signed)
Case Management Note  Patient Details  Name: Patricia [name] MRN: [id number] Date of Birth: [birth year]  Subjective/Objective: Pt in for chest pain. Pt has PCP-@ [place].  Liaison from [facility] did speak with pt in regards to Medicaid and getting the county changed.                   Action/Plan: No needs identified by CM at this time.   Expected Discharge Date:                  Expected Discharge Plan:  Home/Self Care  In-House Referral:  NA  Discharge planning Services  CM Consult  Post Acute Care Choice:  NA Choice offered to:  NA  DME Arranged:  N/A DME Agency:  NA  HH Arranged:  NA HH Agency:  NA  Status of Service:  Completed, signed off  Medicare Important Message Given:    Date Medicare IM Given:    Medicare IM give by:    Date Additional Medicare IM Given:    Additional Medicare Important Message give by:     If discussed at [place] of Stay Meetings, dates discussed:    Additional Comments:  Bethena Roys, RN 06/19/2015, 2:52 PM

## 2015-06-19 NOTE — Progress Notes (Signed)
The client is continuing to have chest pain that has never went away. Pain is a 7 out of 10, pressure, tightness, to the left side of chest. She has had morphine and nitro earlier in the day that didn't really help her pain. I notified the [facility] on call [facility] and she ordered Dilaudid one time dose 1 mg. Her pain went from 7 to 4 and she went to sleep shortly after. I will continue to monitor the client closely.   Addendum:   The client woke up with some nausea and I gave PRN Zofran and this did not help. Schorr ordered Phenergan and the client went back to sleep and rested throughout the night.

## 2015-06-19 NOTE — Discharge Summary (Signed)
Physician Discharge Summary  [name]:[id number] DOB: [birth year] DOA: 06/17/2015  PCP: Tereasa Coop, PA-C  Admit date: 06/17/2015 Discharge date: 06/19/2015   Recommendations for Outpatient Follow-Up:   1. Stress test negative, follow-up with PCP for nonresolution of symptoms or ongoing chest pains.  Discharge Diagnosis:   Principal Problem:    Chest pain Active Problems:    HTN (hypertension)    DM (diabetes mellitus) ([place])    Symptomatic anemia    CKD stage 3 followed at [facility]    PAT (paroxysmal atrial tachycardia) (HCC)    Dyslipidemia    Atypical chest pain  Discharge disposition:  Home.    Discharge Condition: Improved.  Diet recommendation: Low sodium, heart healthy.  Carbohydrate-modified.    History of Present Illness:   Patricia [name] is an 40 y.o. female with a past medical history of hypertension, dyslipidemia, chronic kidney disease stage IV with baseline creatinine 3.53 Who was admitted 06/17/15 with a chief complaint of left-sided chest pain radiating to the left arm while at rest, and associated with weakness, diaphoresis and shortness of breath. Pain resolved with 3 sublingual nitroglycerin in the ED. Troponin was negative. Cardiology consulted with plans for Myoview.  Hospital Course by Problem:   Principal Problem:  Chest pain No ischemic changes noted on 12-lead EKG. Troponins were negative. Myoview negative. Cleared for discharge by cardiology. Cardiologist wishes to avoid cardiac catheterization at this time given creatinine of 3 and risk of precipitating renal failure. Discussed ways to manage her chest pain should it recur and the likely benign etiology of this pain. Instructed to return to the hospital or call her PCP if she has severe recurrent pain.  Active Problems:  HTN (hypertension) Continue metoprolol, lisinopril and Demadex.   DM (diabetes mellitus) ([place]) with diabetic nephropathy Appears to be diet controlled.  Hemoglobin A1c 5.7%. Continue Neurontin for neuropathy.   Symptomatic anemia On Epogen per nephrologist. Status post 1 unit of PRBCs 06/18/15.   CKD stage 3 followed at [facility] follow-up with nephrology recommended.   PAT (paroxysmal atrial tachycardia) (HCC) No events on telemetry. TSH WNL.   Dyslipidemia Continue atorvastatin. Cholesterol 196, LDL 109.   Medical Consultants:    Cardiology.   Discharge Exam:   Filed Vitals:   06/19/15 0500 06/19/15 0800  BP: 118/73 136/66  Pulse: 62 61  Temp: 98.2 F (36.8 C) 98.2 F (36.8 C)  Resp: 18 18   Filed Vitals:   06/18/15 1948 06/18/15 2337 06/19/15 0500 06/19/15 0800  BP: 140/74 118/73 118/73 136/66  Pulse: 67 67 62 61  Temp: 98.2 F (36.8 C) 98.1 F (36.7 C) 98.2 F (36.8 C) 98.2 F (36.8 C)  TempSrc: Oral Oral Oral Oral  Resp: $Remo'14 18 18 18  'TEpiC$ Height:      Weight:   111.4 kg (245 lb 9.5 oz)   SpO2: 98% 98% 100% 98%    Gen:  NAD Cardiovascular:  RRR, No M/R/G Respiratory: Lungs CTAB Gastrointestinal: Abdomen soft, NT/ND with normal active bowel sounds. Extremities: No C/E/C   The results of significant diagnostics from this hospitalization (including imaging, microbiology, ancillary and laboratory) are listed below for reference.     Procedures and Diagnostic Studies:   Dg Chest 2 View  06/17/2015  CLINICAL DATA:  Acute onset of left-sided chest pain radiating to the left arm. Shortness of breath. Initial encounter. EXAM: CHEST  2 VIEW COMPARISON:  Chest radiograph from 05/29/2015 FINDINGS: The lungs are well-aerated. Mild peribronchial thickening is noted. There is no  evidence of focal opacification, pleural effusion or pneumothorax. The heart is normal in size; the mediastinal contour is within normal limits. No acute osseous abnormalities are seen. IMPRESSION: Mild peribronchial thickening noted.  Lungs otherwise clear. Electronically Signed   By: Garald Balding M.D.   On: 06/17/2015 21:40     Labs:    Basic Metabolic Panel:  Recent Labs Lab 06/17/15 2106 06/18/15 0057 06/18/15 0708  NA 137  --  137  K 3.9  --  3.7  CL 105  --  104  CO2 23  --  24  GLUCOSE 104*  --  92  BUN 60*  --  59*  CREATININE 3.06*  --  3.00*  CALCIUM 7.8*  --  7.9*  MG  --  1.7 1.7  PHOS  --  4.8* 5.0*   GFR Estimated Creatinine Clearance: 31.8 mL/min (by C-G formula based on Cr of 3). Liver Function Tests:  Recent Labs Lab 06/18/15 0708  AST 20  ALT 14  ALKPHOS 30*  BILITOT 0.4  PROT 5.5*  ALBUMIN 2.7*   Coagulation profile  Recent Labs Lab 06/18/15 0113  INR 0.98    CBC:  Recent Labs Lab 06/17/15 2106 06/18/15 0708  WBC 5.6 5.0  HGB 7.8* 8.5*  HCT 25.4* 27.8*  MCV 77.9* 79.4  PLT 339 306   Cardiac Enzymes:  Recent Labs Lab 06/18/15 0057 06/18/15 0708 06/18/15 1323  TROPONINI <0.03 <0.03 <0.03   CBG:  Recent Labs Lab 06/18/15 0308  GLUCAP 106*   Hgb A1c  Recent Labs  06/18/15 0708  HGBA1C 5.7*   Lipid Profile  Recent Labs  06/18/15 0708  CHOL 196  HDL 34*  LDLCALC 109*  TRIG 265*  CHOLHDL 5.8   Thyroid function studies  Recent Labs  06/18/15 0708  TSH 1.973   Anemia work up No results for input(s): VITAMINB12, FOLATE, FERRITIN, TIBC, IRON, RETICCTPCT in the last 72 hours. Microbiology Recent Results (from the past 240 hour(s))  MRSA PCR Screening     Status: None   Collection Time: 06/18/15  1:50 AM  Result Value Ref Range Status   MRSA by PCR NEGATIVE NEGATIVE Final    Comment:        The GeneXpert MRSA Assay (FDA approved for NASAL specimens only), is one component of a comprehensive MRSA colonization surveillance program. It is not intended to diagnose MRSA infection nor to guide or monitor treatment for MRSA infections.      Discharge Instructions:   Discharge Instructions    Call MD for:  extreme fatigue    Complete by:  As directed      Call MD for:  severe uncontrolled pain    Complete by:  As directed       Diet - low sodium heart healthy    Complete by:  As directed      Increase activity slowly    Complete by:  As directed             Medication List    TAKE these medications        allopurinol 100 MG tablet  Commonly known as:  ZYLOPRIM  Take 100 mg by mouth daily.     amLODipine 5 MG tablet  Commonly known as:  NORVASC  Take 5 mg by mouth daily.     aspirin 81 MG chewable tablet  Chew 1 tablet (81 mg total) by mouth daily.     atorvastatin 40 MG tablet  Commonly known as:  LIPITOR  Take 40 mg by mouth daily.     calcium acetate 667 MG tablet  Commonly known as:  PHOSLO  Take 1,334 mg by mouth 3 (three) times daily.     colchicine 0.6 MG tablet  Take 0.6 mg by mouth every other day.     diphenhydramine-acetaminophen 25-500 MG Tabs tablet  Commonly known as:  TYLENOL PM  Take 2 tablets by mouth at bedtime as needed. For sleep     gabapentin 300 MG capsule  Commonly known as:  NEURONTIN  Take 300 mg by mouth 2 (two) times daily.     lisinopril 10 MG tablet  Commonly known as:  PRINIVIL,ZESTRIL  Take 10 mg by mouth daily.     magnesium oxide 400 MG tablet  Commonly known as:  MAG-OX  Take 1 tablet (400 mg total) by mouth daily.     meclizine 25 MG tablet  Commonly known as:  ANTIVERT  Take 1 tablet (25 mg total) by mouth 3 (three) times daily as needed for dizziness.     metolazone 2.5 MG tablet  Commonly known as:  ZAROXOLYN  Take 1 tablet (2.5 mg total) by mouth daily as needed (for leg swellings or shortness of breath).     metoprolol tartrate 25 MG tablet  Commonly known as:  LOPRESSOR  Take 1 tablet (25 mg total) by mouth 2 (two) times daily.     multivitamin with minerals Tabs tablet  Take 1 tablet by mouth daily.     omeprazole 40 MG capsule  Commonly known as:  PRILOSEC  Take 40 mg by mouth daily.     potassium chloride 10 MEQ tablet  Commonly known as:  K-DUR  Take 10 mEq by mouth daily.     promethazine 25 MG tablet  Commonly known as:   PHENERGAN  Take 1 tablet (25 mg total) by mouth every 6 (six) hours as needed for nausea or vomiting.     torsemide 20 MG tablet  Commonly known as:  DEMADEX  Take 2 tablets (40 mg total) by mouth daily.     traZODone 50 MG tablet  Commonly known as:  DESYREL  Take 50 mg by mouth at bedtime as needed for sleep.     VITAMIN D-1000 MAX ST 1000 units tablet  Generic drug:  Cholecalciferol  Take 1,000 Units by mouth daily.          Time coordinating discharge: 30 minutes.  Signed:  RAMA,CHRISTINA  Pager [phone] [facility] 06/19/2015, 3:37 PM

## 2015-06-19 NOTE — Plan of Care (Signed)
Problem: Pain Managment: Goal: General experience of comfort will improve Outcome: Progressing The client is still having chest pain the MD was notified and orders were given. (see note)

## 2015-06-19 NOTE — Progress Notes (Signed)
[name] Date of Encounter: 06/19/2015   SUBJECTIVE  Still having mild chest pressure. No SOB. Today is day 2 of Myoview.   CURRENT MEDS . allopurinol  100 mg Oral Daily  . aspirin  81 mg Oral Daily  . atorvastatin  40 mg Oral Daily  . calcium acetate  1,334 mg Oral TID WC  . colchicine  0.6 mg Oral QODAY  . enoxaparin (LOVENOX) injection  30 mg Subcutaneous Q24H  . gabapentin  300 mg Oral BID  . magnesium oxide  400 mg Oral Daily  . metoprolol tartrate  25 mg Oral BID  . pantoprazole  40 mg Oral Daily  . sodium chloride flush  3 mL Intravenous Q12H  . torsemide  80 mg Oral Daily    OBJECTIVE  Filed Vitals:   06/18/15 1948 06/18/15 2337 06/19/15 0500 06/19/15 0800  BP: 140/74 118/73 118/73 136/66  Pulse: 67 67 62 61  Temp: 98.2 F (36.8 C) 98.1 F (36.7 C) 98.2 F (36.8 C) 98.2 F (36.8 C)  TempSrc: Oral Oral Oral Oral  Resp: $Remo'14 18 18 18  'WXDrv$ Height:      Weight:   245 lb 9.5 oz (111.4 kg)   SpO2: 98% 98% 100% 98%    Intake/Output Summary (Last 24 hours) at 06/19/15 1035 Last data filed at 06/19/15 0500  Gross per 24 hour  Intake      3 ml  Output   1500 ml  Net  -1497 ml   Filed Weights   06/18/15 0150 06/19/15 0500  Weight: 248 lb 12.8 oz (112.855 kg) 245 lb 9.5 oz (111.4 kg)    PHYSICAL EXAM  General: Pleasant, NAD. Neuro: Alert and oriented X 3. Moves all extremities spontaneously. Psych: Normal affect. HEENT:  Normal  Neck: Supple without bruits or JVD. Lungs:  Resp regular and unlabored, CTA. Heart: RRR no s3, s4, or murmurs. Abdomen: Soft, non-tender, non-distended, BS + x 4.  Extremities: No clubbing, cyanosis or edema. DP/PT/Radials 2+ and equal bilaterally.  Accessory Clinical Findings  CBC  Recent Labs  06/17/15 2106 06/18/15 0708  WBC 5.6 5.0  HGB 7.8* 8.5*  HCT 25.4* 27.8*  MCV 77.9* 79.4  PLT 339 622   Basic Metabolic Panel  Recent Labs  06/17/15 2106 06/18/15 0057 06/18/15 0708  NA 137  --  137   K 3.9  --  3.7  CL 105  --  104  CO2 23  --  24  GLUCOSE 104*  --  92  BUN 60*  --  59*  CREATININE 3.06*  --  3.00*  CALCIUM 7.8*  --  7.9*  MG  --  1.7 1.7  PHOS  --  4.8* 5.0*   Liver Function Tests  Recent Labs  06/18/15 0708  AST 20  ALT 14  ALKPHOS 30*  BILITOT 0.4  PROT 5.5*  ALBUMIN 2.7*   No results for input(s): LIPASE, AMYLASE in the last 72 hours. Cardiac Enzymes  Recent Labs  06/18/15 0057 06/18/15 0708 06/18/15 1323  TROPONINI <0.03 <0.03 <0.03   Hemoglobin A1C  Recent Labs  06/18/15 0708  HGBA1C 5.7*   Fasting Lipid Panel  Recent Labs  06/18/15 0708  CHOL 196  HDL 34*  LDLCALC 109*  TRIG 265*  CHOLHDL 5.8   Thyroid Function Tests  Recent Labs  06/18/15 0708  TSH 1.973    TELE  Troponin negative  Radiology/Studies  Dg Chest 2 View  06/17/2015  CLINICAL DATA:  Acute  onset of left-sided chest pain radiating to the left arm. Shortness of breath. Initial encounter. EXAM: CHEST  2 VIEW COMPARISON:  Chest radiograph from 05/29/2015 FINDINGS: The lungs are well-aerated. Mild peribronchial thickening is noted. There is no evidence of focal opacification, pleural effusion or pneumothorax. The heart is normal in size; the mediastinal contour is within normal limits. No acute osseous abnormalities are seen. IMPRESSION: Mild peribronchial thickening noted.  Lungs otherwise clear. Electronically Signed   By: Garald Balding M.D.   On: 06/17/2015 21:40   Dg Chest 2 View  05/29/2015  CLINICAL DATA:  Acute onset of generalized chest pain and shortness of breath. Initial encounter. EXAM: CHEST  2 VIEW COMPARISON:  Chest radiograph performed 04/24/2015 FINDINGS: The lungs are well-aerated. Minimal bibasilar atelectasis is noted. There is no evidence of pleural effusion or pneumothorax. The heart is normal in size; the mediastinal contour is within normal limits. No acute osseous abnormalities are seen. IMPRESSION: Minimal bibasilar atelectasis noted.   Lungs otherwise clear. Electronically Signed   By: Garald Balding M.D.   On: 05/29/2015 00:51    ASSESSMENT AND PLAN  1. Chest pain - Troponin x 3 negative. Today is day 2 of Myoview. Her creatinine is 3 so plan to avoid cath unless definite abnormality.  2. HL - 06/18/2015: Cholesterol 196; HDL 34*; LDL Cholesterol 109*; Triglycerides 265*; VLDL 53* Continue statin  3. HTN - Well controlled with amlodipine and metoprolol. Her home lisinopril is on hold in setting of CKD.   4. CKD, stage III    Signed, Bhagat,Bhavinkumar PA-C Pager [phone]  Attending Note:   The patient was seen and examined.  Agree with assessment and plan as noted above.  Changes made to the above note as needed.  Patient seen and independently examined with Robbie Lis, PA .   We discussed all aspects of the encounter. I agree with the assessment and plan as stated above.  2 day stress myoview shows no ischemia and normal LV function OK to go home  She still has some occasional CP - I doubt this is cardiac     I have spent a total of 40 minutes with patient reviewing hospital  notes , telemetry, EKGs, labs and examining patient as well as establishing an assessment and plan that was discussed with the patient. > 50% of time was spent in direct patient care.    Thayer Headings, Brooke Bonito., MD, [facility] 06/19/2015, 12:15 PM [address]  [place] Pager [phone]

## 2015-06-22 LAB — TYPE AND SCREEN
ABO/RH(D): O NEG
ANTIBODY SCREEN: NEGATIVE
Unit division: 0
Unit division: 0

## 2015-07-20 ENCOUNTER — Emergency Department (HOSPITAL_COMMUNITY): Payer: Medicare Other

## 2015-07-20 ENCOUNTER — Encounter (HOSPITAL_COMMUNITY): Payer: Self-pay

## 2015-07-20 ENCOUNTER — Emergency Department (HOSPITAL_COMMUNITY)
Admission: EM | Admit: 2015-07-20 | Discharge: 2015-07-20 | Disposition: A | Discharge status: Home / Self Care | Payer: Medicare Other | Attending: Emergency Medicine | Admitting: Emergency Medicine

## 2015-07-20 DIAGNOSIS — I120 Hypertensive chronic kidney disease with stage 5 chronic kidney disease or end stage renal disease: Secondary | ICD-10-CM | POA: Diagnosis not present

## 2015-07-20 DIAGNOSIS — Z7982 Long term (current) use of aspirin: Secondary | ICD-10-CM | POA: Diagnosis not present

## 2015-07-20 DIAGNOSIS — Z79899 Other long term (current) drug therapy: Secondary | ICD-10-CM | POA: Diagnosis not present

## 2015-07-20 DIAGNOSIS — N186 End stage renal disease: Secondary | ICD-10-CM | POA: Insufficient documentation

## 2015-07-20 DIAGNOSIS — E1122 Type 2 diabetes mellitus with diabetic chronic kidney disease: Secondary | ICD-10-CM | POA: Insufficient documentation

## 2015-07-20 DIAGNOSIS — R109 Unspecified abdominal pain: Secondary | ICD-10-CM | POA: Insufficient documentation

## 2015-07-20 LAB — COMPREHENSIVE METABOLIC PANEL
ALT: 20 U/L (ref 14–54)
AST: 25 U/L (ref 15–41)
Albumin: 2.9 g/dL — ABNORMAL LOW (ref 3.5–5.0)
Alkaline Phosphatase: 36 U/L — ABNORMAL LOW (ref 38–126)
Anion gap: 10 (ref 5–15)
BUN: 68 mg/dL — ABNORMAL HIGH (ref 6–20)
CO2: 23 mmol/L (ref 22–32)
Calcium: 8.6 mg/dL — ABNORMAL LOW (ref 8.9–10.3)
Chloride: 106 mmol/L (ref 101–111)
Creatinine, Ser: 3.62 mg/dL — ABNORMAL HIGH (ref 0.44–1.00)
GFR calc Af Amer: 17 mL/min — ABNORMAL LOW (ref >60)
GFR calc non Af Amer: 15 mL/min — ABNORMAL LOW (ref >60)
Glucose, Bld: 133 mg/dL — ABNORMAL HIGH (ref 65–99)
Potassium: 3.6 mmol/L (ref 3.5–5.1)
Sodium: 139 mmol/L (ref 135–145)
Total Bilirubin: 0.3 mg/dL (ref 0.3–1.2)
Total Protein: 6.2 g/dL — ABNORMAL LOW (ref 6.5–8.1)

## 2015-07-20 LAB — URINALYSIS, ROUTINE W REFLEX MICROSCOPIC
Bilirubin Urine: NEGATIVE
Glucose, UA: 100 mg/dL — AB
Ketones, ur: NEGATIVE mg/dL
Leukocytes, UA: NEGATIVE
Nitrite: NEGATIVE
Protein, ur: >300 mg/dL — AB
Specific Gravity, Urine: 1.01 (ref 1.005–1.030)
pH: 6 (ref 5.0–8.0)

## 2015-07-20 LAB — CBC WITH DIFFERENTIAL/PLATELET
Basophils Absolute: 0 10*3/uL (ref 0.0–0.1)
Basophils Relative: 0 %
Eosinophils Absolute: 0.1 10*3/uL (ref 0.0–0.7)
Eosinophils Relative: 2 %
HCT: 32.4 % — ABNORMAL LOW (ref 36.0–46.0)
Hemoglobin: 10.2 g/dL — ABNORMAL LOW (ref 12.0–15.0)
Lymphocytes Relative: 32 %
Lymphs Abs: 1.6 10*3/uL (ref 0.7–4.0)
MCH: 25.6 pg — ABNORMAL LOW (ref 26.0–34.0)
MCHC: 31.5 g/dL (ref 30.0–36.0)
MCV: 81.2 fL (ref 78.0–100.0)
Monocytes Absolute: 0.3 10*3/uL (ref 0.1–1.0)
Monocytes Relative: 6 %
Neutro Abs: 3 10*3/uL (ref 1.7–7.7)
Neutrophils Relative %: 60 %
Platelets: 359 10*3/uL (ref 150–400)
RBC: 3.99 MIL/uL (ref 3.87–5.11)
RDW: 17.9 % — ABNORMAL HIGH (ref 11.5–15.5)
WBC: 5.1 10*3/uL (ref 4.0–10.5)

## 2015-07-20 LAB — URINE MICROSCOPIC-ADD ON: WBC UA: NONE SEEN WBC/hpf (ref 0–5)

## 2015-07-20 LAB — I-STAT CG4 LACTIC ACID, ED: Lactic Acid, Venous: 1.7 mmol/L (ref 0.5–1.9)

## 2015-07-20 MED ORDER — ACETAMINOPHEN 325 MG PO TABS
650.0000 mg | ORAL_TABLET | Freq: Once | ORAL | Status: AC
  Administered 2015-07-20: 650 mg via ORAL
  Filled 2015-07-20: qty 2

## 2015-07-20 NOTE — ED Notes (Addendum)
Patient here with right flank pain that started yesterday with radiation to right abdomen, nausea with same, no vomiting, no diarrhea. Currently waiting on kidney transplant, has not started dialysis. Alert and oriented. States that she feels very hot and had several episodes of diaphoresis last night and am

## 2015-07-20 NOTE — ED Provider Notes (Signed)
CSN: [id number]     Arrival date & time 07/20/15  1654 History   First MD Initiated Contact with Patient 07/20/15 1957     Chief Complaint  Patient presents with  . Flank Pain  . Abdominal Pain     (Consider location/radiation/quality/duration/timing/severity/associated sxs/prior Treatment) HPI Comments: 40 year old female who is end-stage renal disease predialysis he was awaiting a transplant at [facility] with borderline creatinine at 3-4 he states that for the past 24 hours she's had some discomfort in her right flank area she has underlying nausea constantly that can be controlled with Phenergan but she doesn't like to use it as it makes her very somnolent, she's been taking Tylenol for discomfort she doesn't want anything stronger tonight she is looking for reassurance that there is nothing changed as far as her left scalp is no acute illness at this time patient denies fever she does state that she felt sweaty last night and today on 2 separate occasions but no chills no diarrhea no cough no rhinitis or throat no real contacts that she is aware  Patient is a 40 y.o. female presenting with flank pain and abdominal pain. The history is provided by the patient.  Flank Pain This is a recurrent problem. The current episode started yesterday. The problem occurs constantly. The problem has been unchanged. Associated symptoms include abdominal pain, fatigue and nausea. Pertinent negatives include no chest pain, coughing, fever, myalgias, rash or vomiting. Nothing aggravates the symptoms. She has tried nothing for the symptoms. The treatment provided no relief.  Abdominal Pain Associated symptoms: fatigue and nausea   Associated symptoms: no chest pain, no constipation, no cough, no diarrhea, no dysuria, no fever, no hematuria, no shortness of breath and no vomiting     Past Medical History  Diagnosis Date  . Hypertension   . Acid reflux   . High cholesterol   . History of blood transfusion "a  couple"    "related to low counts"  . Headache(784.0)     "related to chemo; sometimes weekly" (09/12/2013)  . Type II diabetes mellitus ([place])     "took me off my RX ~ 04/2013"  . Iron deficiency anemia     "get epogen shots q month" (02/20/2014)  . Chronic kidney disease (CKD), stage III (moderate)   . 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 Ankle fracture surgery Right 1994  . Abdominal hysterectomy  2010    "laparoscopic"  . Fracture surgery    . Cardiac catheterization  2000's   Family History  Problem Relation Age of Onset  . Hypertension Mother   . Thyroid disease Mother   . Coronary artery disease Father   . Hypertension Father   . Diabetes Father    Social History  Substance Use Topics  . Smoking status: Never Smoker   . Smokeless tobacco: Never Used  . Alcohol Use: No   OB History    Gravida Para Term Preterm AB TAB SAB Ectopic Multiple Living   3        3      Review of Systems  Constitutional: Positive for fatigue. Negative for fever.  Respiratory: Negative for cough and shortness of breath.   Cardiovascular: Negative for chest pain.  Gastrointestinal: Positive for nausea and abdominal pain. Negative for vomiting, diarrhea, constipation, blood in stool and abdominal distention.  Genitourinary: Positive for flank  pain. Negative for dysuria and hematuria.  Musculoskeletal: Negative for myalgias.  Skin: Negative for rash and wound.  All other systems reviewed and are negative.     Allergies  Nsaids and Tolmetin  Home Medications   Prior to Admission medications   Medication Sig Start Date End Date Taking? Authorizing Provider  allopurinol (ZYLOPRIM) 100 MG tablet Take 100 mg by mouth daily.   Yes Historical Provider, MD  amLODipine (NORVASC) 5 MG tablet Take 5 mg by mouth daily. 08/12/14  Yes Historical Provider, MD  aspirin 81 MG chewable  tablet Chew 1 tablet (81 mg total) by mouth daily. Patient taking differently: Chew 81 mg by mouth every morning.  10/03/13  Yes Eugenie Filler, MD  atorvastatin (LIPITOR) 40 MG tablet Take 40 mg by mouth daily. 03/14/14  Yes Historical Provider, MD  calcium acetate (PHOSLO) 667 MG tablet Take 1,334 mg by mouth at bedtime.  08/02/14 08/02/15 Yes Historical Provider, MD  Cholecalciferol (VITAMIN D-1000 MAX ST) 1000 UNITS tablet Take 1,000 Units by mouth daily. 08/02/14 08/02/15 Yes Historical Provider, MD  colchicine 0.6 MG tablet Take 0.6 mg by mouth every other day. 10/30/14 10/30/15 Yes Historical Provider, MD  gabapentin (NEURONTIN) 300 MG capsule Take 300 mg by mouth 2 (two) times daily. 06/12/15 06/11/16 Yes Historical Provider, MD  lisinopril (PRINIVIL,ZESTRIL) 10 MG tablet Take 10 mg by mouth daily.   Yes Historical Provider, MD  LORazepam (ATIVAN) 0.5 MG tablet Take 0.5 mg by mouth every 8 (eight) hours as needed for anxiety.  07/11/15  Yes Historical Provider, MD  magnesium oxide (MAG-OX) 400 MG tablet Take 1 tablet (400 mg total) by mouth daily. 04/29/15  Yes Venetia Maxon Rama, MD  metolazone (ZAROXOLYN) 2.5 MG tablet Take 1 tablet (2.5 mg total) by mouth daily as needed (for leg swellings or shortness of breath). Patient taking differently: Take 2.5 mg by mouth daily.  04/12/14  Yes Nishant Dhungel, MD  metoprolol tartrate (LOPRESSOR) 25 MG tablet Take 1 tablet (25 mg total) by mouth 2 (two) times daily. 10/03/13  Yes Eugenie Filler, MD  Multiple Vitamin (MULTIVITAMIN WITH MINERALS) TABS tablet Take 1 tablet by mouth daily.   Yes Historical Provider, MD  omeprazole (PRILOSEC) 40 MG capsule Take 40 mg by mouth daily.     Yes Historical Provider, MD  potassium chloride (K-DUR) 10 MEQ tablet Take 10 mEq by mouth daily. 08/12/14  Yes Historical Provider, MD  promethazine (PHENERGAN) 25 MG tablet Take 1 tablet (25 mg total) by mouth every 6 (six) hours as needed for nausea or vomiting. 01/23/15  Yes Margarita Mail, PA-C  torsemide (DEMADEX) 20 MG tablet Take 2 tablets (40 mg total) by mouth daily. Patient taking differently: Take 80 mg by mouth daily.  04/12/14  Yes Nishant Dhungel, MD  traZODone (DESYREL) 50 MG tablet Take 50 mg by mouth at bedtime as needed for sleep.    Yes Historical Provider, MD  diphenhydramine-acetaminophen (TYLENOL PM) 25-500 MG TABS tablet Take 2 tablets by mouth at bedtime as needed (for sleep).     Historical Provider, MD   BP 145/71 mmHg  Pulse 67  Temp(Src) 97.7 F (36.5 C) (Oral)  Resp 22  Ht $R'5\' 7"'ji$  (1.702 m)  Wt 111.131 kg  BMI 38.36 kg/m2  SpO2 100% Physical Exam  Constitutional: She appears well-developed and well-nourished.  HENT:  Head: Normocephalic.  Eyes: Pupils are equal, round, and reactive to light.  Neck: Normal range of motion.  Cardiovascular: Normal rate.  Pulmonary/Chest: Effort normal.  Abdominal: Soft.  Musculoskeletal: Normal range of motion.  Neurological: She is alert.  Skin: Skin is warm and dry. No rash noted.  Nursing note and vitals reviewed.   ED Course  Procedures (including critical care time) Labs Review Labs Reviewed  COMPREHENSIVE METABOLIC PANEL - Abnormal; Notable for the following:    Glucose, Bld 133 (*)    BUN 68 (*)    Creatinine, Ser 3.62 (*)    Calcium 8.6 (*)    Total Protein 6.2 (*)    Albumin 2.9 (*)    Alkaline Phosphatase 36 (*)    GFR calc non Af Amer 15 (*)    GFR calc Af Amer 17 (*)    All other components within normal limits  CBC WITH DIFFERENTIAL/PLATELET - Abnormal; Notable for the following:    Hemoglobin 10.2 (*)    HCT 32.4 (*)    MCH 25.6 (*)    RDW 17.9 (*)    All other components within normal limits  URINALYSIS, ROUTINE W REFLEX MICROSCOPIC (NOT AT [facility]) - Abnormal; Notable for the following:    Glucose, UA 100 (*)    Hgb urine dipstick SMALL (*)    Protein, ur >300 (*)    All other components within normal limits  URINE MICROSCOPIC-ADD ON - Abnormal; Notable for the following:     Squamous Epithelial / LPF 0-5 (*)    Bacteria, UA RARE (*)    All other components within normal limits  I-STAT CG4 LACTIC ACID, ED    Imaging Review Dg Chest 2 View  07/20/2015  CLINICAL DATA:  Right side flank pain, nausea, weakness and central chest pain all started yesterday. Hx of HTN. EXAM: CHEST  2 VIEW COMPARISON:  06/17/2015 FINDINGS: Prominent left ventricular contour of the heart. Heart is upper limits normal in size. There are no focal consolidations or pleural effusions. No pulmonary edema. IMPRESSION: No evidence for acute cardiopulmonary abnormality. Prominent left ventricular contour. Findings are consistent with history of hypertension. Electronically Signed   By: Nolon Nations M.D.   On: 07/20/2015 17:28   I have personally reviewed and evaluated these images and lab results as part of my medical decision-making.   EKG Interpretation None     Last been reviewed x-rays and reviewed urine is stable all of her labs urine and chest x-ray are within normal parameters for this patient as has been discussed with Dr. Wilson Singer who agrees that there is no acute pathology evident at this time patient has been encouraged to call her nephrologist and primary care physician on Monday she is to return if there's any change in her condition Review of patient's chart shows that she had a CT scan in January 2017 did not show any kidney stones or gallstones she did have an ultrasound in 2016 which showed normal gallbladder without stones I'm confident that she has not developed a kidney stone or gallstones in less than a year MDM   Final diagnoses:  Flank pain         Junius Creamer, NP 07/20/15 2046  Virgel Manifold, MD 08/05/15 [phone]

## 2015-07-20 NOTE — ED Notes (Signed)
Patient Alert and oriented X4. Stable and ambulatory. Patient verbalized understanding of the discharge instructions.  Patient belongings were taken by the patient.  

## 2015-07-20 NOTE — Discharge Instructions (Signed)
As discussed review of your labs show that you are BUN and creatinine are stable for you your kidney function is stable although poor do not have a urinary tract infection he do not have pneumonia on your chest x-ray CT scan and ultrasound with in the last year were both reviewed which showed no gallstones or kidney stones and gout at this time that a you've acutely developed either please call your primary care physician and your nephrologist on Monday to discuss your discomfort   Flank Pain Flank pain is pain in your side. The flank is the area of your side between your upper belly (abdomen) and your back. Pain in this area can be caused by many different things. [place] care and treatment will depend on the cause of your pain.  Rest as told by your doctor.  Drink enough fluids to keep your pee (urine) clear or pale yellow.  Only take medicine as told by your doctor.  Tell your doctor about any changes in your pain.  Follow up with your doctor. GET HELP RIGHT AWAY IF:   Your pain does not get better with medicine.   You have new symptoms or your symptoms get worse.  Your pain gets worse.   You have belly (abdominal) pain.   You are short of breath.   You always feel sick to your stomach (nauseous).   You keep throwing up (vomiting).   You have puffiness (swelling) in your belly.   You feel light-headed or you pass out (faint).   You have blood in your pee.  You have a fever or lasting symptoms for more than 2-3 days.  You have a fever and your symptoms suddenly get worse. MAKE SURE YOU:   Understand these instructions.  Will watch your condition.  Will get help right away if you are not doing well or get worse.   This information is not intended to replace advice given to you by your health care provider. Make sure you discuss any questions you have with your health care provider.   Document Released: 10/01/2007 Document Revised: 01/12/2014  Document Reviewed: 08/06/2011 Elsevier Interactive Patient Education [employer].

## 2015-09-04 ENCOUNTER — Emergency Department (HOSPITAL_COMMUNITY): Payer: Medicare Other

## 2015-09-04 ENCOUNTER — Encounter (HOSPITAL_COMMUNITY): Payer: Self-pay | Admitting: Emergency Medicine

## 2015-09-04 DIAGNOSIS — R0789 Other chest pain: Secondary | ICD-10-CM | POA: Insufficient documentation

## 2015-09-04 DIAGNOSIS — Z7982 Long term (current) use of aspirin: Secondary | ICD-10-CM | POA: Insufficient documentation

## 2015-09-04 DIAGNOSIS — N183 Chronic kidney disease, stage 3 (moderate): Secondary | ICD-10-CM | POA: Diagnosis not present

## 2015-09-04 DIAGNOSIS — Z79899 Other long term (current) drug therapy: Secondary | ICD-10-CM | POA: Insufficient documentation

## 2015-09-04 DIAGNOSIS — R0602 Shortness of breath: Secondary | ICD-10-CM | POA: Insufficient documentation

## 2015-09-04 DIAGNOSIS — I129 Hypertensive chronic kidney disease with stage 1 through stage 4 chronic kidney disease, or unspecified chronic kidney disease: Secondary | ICD-10-CM | POA: Diagnosis not present

## 2015-09-04 DIAGNOSIS — E119 Type 2 diabetes mellitus without complications: Secondary | ICD-10-CM | POA: Diagnosis not present

## 2015-09-04 LAB — CBC
HEMATOCRIT: 31.2 % — AB (ref 36.0–46.0)
Hemoglobin: 9.6 g/dL — ABNORMAL LOW (ref 12.0–15.0)
MCH: 24.7 pg — AB (ref 26.0–34.0)
MCHC: 30.8 g/dL (ref 30.0–36.0)
MCV: 80.4 fL (ref 78.0–100.0)
Platelets: 357 10*3/uL (ref 150–400)
RBC: 3.88 MIL/uL (ref 3.87–5.11)
RDW: 15.6 % — AB (ref 11.5–15.5)
WBC: 6.6 10*3/uL (ref 4.0–10.5)

## 2015-09-04 LAB — BASIC METABOLIC PANEL
Anion gap: 11 (ref 5–15)
BUN: 77 mg/dL — AB (ref 6–20)
CHLORIDE: 99 mmol/L — AB (ref 101–111)
CO2: 26 mmol/L (ref 22–32)
Calcium: 8.7 mg/dL — ABNORMAL LOW (ref 8.9–10.3)
Creatinine, Ser: 3.74 mg/dL — ABNORMAL HIGH (ref 0.44–1.00)
GFR calc Af Amer: 16 mL/min — ABNORMAL LOW (ref >60)
GFR calc non Af Amer: 14 mL/min — ABNORMAL LOW (ref >60)
GLUCOSE: 121 mg/dL — AB (ref 65–99)
POTASSIUM: 3.5 mmol/L (ref 3.5–5.1)
Sodium: 136 mmol/L (ref 135–145)

## 2015-09-04 LAB — I-STAT TROPONIN, ED: Troponin i, poc: 0.01 ng/mL (ref 0.00–0.08)

## 2015-09-04 LAB — BRAIN NATRIURETIC PEPTIDE: B Natriuretic Peptide: 8.2 pg/mL (ref 0.0–100.0)

## 2015-09-04 NOTE — ED Triage Notes (Signed)
Patient with chest pain that started earlier today, with mild shortness of breath and nausea.  Patient states pain is on the left side of her chest and describes as a pressure and pain.  Patient is CAOx4.

## 2015-09-05 ENCOUNTER — Emergency Department (HOSPITAL_COMMUNITY)
Admission: EM | Admit: 2015-09-05 | Discharge: 2015-09-05 | Disposition: A | Discharge status: Home / Self Care | Payer: Medicare Other | Attending: Emergency Medicine | Admitting: Emergency Medicine

## 2015-09-05 ENCOUNTER — Emergency Department (HOSPITAL_COMMUNITY): Payer: Medicare Other

## 2015-09-05 DIAGNOSIS — R0789 Other chest pain: Secondary | ICD-10-CM

## 2015-09-05 DIAGNOSIS — R079 Chest pain, unspecified: Secondary | ICD-10-CM

## 2015-09-05 LAB — D-DIMER, QUANTITATIVE: D-Dimer, Quant: 0.89 ug/mL-FEU — ABNORMAL HIGH (ref 0.00–0.50)

## 2015-09-05 LAB — I-STAT TROPONIN, ED
Troponin i, poc: 0 ng/mL (ref 0.00–0.08)
Troponin i, poc: 0 ng/mL (ref 0.00–0.08)

## 2015-09-05 MED ORDER — HYDROMORPHONE HCL 1 MG/ML IJ SOLN
1.0000 mg | Freq: Once | INTRAMUSCULAR | Status: AC
  Administered 2015-09-05: 1 mg via INTRAVENOUS
  Filled 2015-09-05 (×2): qty 1

## 2015-09-05 MED ORDER — ONDANSETRON HCL 4 MG/2ML IJ SOLN
4.0000 mg | Freq: Once | INTRAMUSCULAR | Status: AC
  Administered 2015-09-05: 4 mg via INTRAVENOUS
  Filled 2015-09-05: qty 2

## 2015-09-05 MED ORDER — TECHNETIUM TC 99M DIETHYLENETRIAME-PENTAACETIC ACID: 32.8000 | Freq: Once | INTRAVENOUS | Status: DC | PRN

## 2015-09-05 MED ORDER — MORPHINE SULFATE (PF) 4 MG/ML IV SOLN
4.0000 mg | Freq: Once | INTRAVENOUS | Status: AC
  Administered 2015-09-05: 4 mg via INTRAVENOUS
  Filled 2015-09-05: qty 1

## 2015-09-05 MED ORDER — OXYCODONE-ACETAMINOPHEN 5-325 MG PO TABS: 1.0000 | ORAL_TABLET | Freq: Four times a day (QID) | ORAL | 0 refills | Status: DC | PRN

## 2015-09-05 MED ORDER — TECHNETIUM TO 99M ALBUMIN AGGREGATED
4.3500 | Freq: Once | INTRAVENOUS | Status: AC | PRN
  Administered 2015-09-05: 4 via INTRAVENOUS

## 2015-09-05 NOTE — ED Provider Notes (Addendum)
By signing my name below, I, Emmanuella Mensah, attest that this documentation has been prepared under the direction and in the presence of [place], DO. Electronically Signed: Judithann Sauger, ED Scribe. 09/05/15. 2:28 AM.    TIME SEEN: 2:07 AM   CHIEF COMPLAINT: Chest pain   HPI: Patricia [name] is a 40 y.o. female with a hx of Chronic Kidney Disease Stage III, DM II, hypertension, and high cholesterol who presents to the Emergency Department complaining of ongoing moderate heavy, pressure left-sided chest pain onset yesterday am. She reports associated nausea, subjective fever with chills, mild SOB, and generalized body aches. She notes that the pain is worse with deep breathing, movement, lying flat. No alleviating factors noted. Pt has not tried any medications PTA. She denies that she is a current smoker. She reports that she had a stress test last year but denies any heart catheterization. She denies any PE/DVT. She denies any cough, vomiting, or diarrhea. No lower extremity swelling or pain.  On review of her records it appears she had a stress test in June 2017 which was low risk.  PCP: Dr. Olen Pel, [place]: See HPI Constitutional: Subjective fever  Eyes: no drainage  ENT: no runny nose   Cardiovascular: chest pain  Resp: SOB  GI: no vomiting GU: no dysuria Integumentary: no rash  Allergy: no hives  Musculoskeletal: no leg swelling  Neurological: no slurred speech ROS otherwise negative  PAST MEDICAL HISTORY/PAST SURGICAL HISTORY:  Past Medical History:  Diagnosis Date  . Acid reflux   . Chronic kidney disease (CKD), stage III (moderate)   . Headache(784.0)    "related to chemo; sometimes weekly" (09/12/2013)  . High cholesterol   . History of blood transfusion "a couple"   "related to low counts"  . Hypertension   . Iron deficiency anemia    "get epogen shots q month" (02/20/2014)  . MCGN (minimal change glomerulonephritis)    "using chemo to tx;   finished my last tx in 12/2013"  . Type II diabetes mellitus ([place])    "took me off my RX ~ 04/2013"    MEDICATIONS:  Prior to Admission medications   Medication Sig Start Date End Date Taking? Authorizing Provider  allopurinol (ZYLOPRIM) 100 MG tablet Take 100 mg by mouth daily.    Historical Provider, MD  amLODipine (NORVASC) 5 MG tablet Take 5 mg by mouth daily. 08/12/14   Historical Provider, MD  aspirin 81 MG chewable tablet Chew 1 tablet (81 mg total) by mouth daily. Patient taking differently: Chew 81 mg by mouth every morning.  10/03/13   Eugenie Filler, MD  atorvastatin (LIPITOR) 40 MG tablet Take 40 mg by mouth daily. 03/14/14   Historical Provider, MD  colchicine 0.6 MG tablet Take 0.6 mg by mouth every other day. 10/30/14 10/30/15  Historical Provider, MD  diphenhydramine-acetaminophen (TYLENOL PM) 25-500 MG TABS tablet Take 2 tablets by mouth at bedtime as needed (for sleep).     Historical Provider, MD  gabapentin (NEURONTIN) 300 MG capsule Take 300 mg by mouth 2 (two) times daily. 06/12/15 06/11/16  Historical Provider, MD  lisinopril (PRINIVIL,ZESTRIL) 10 MG tablet Take 10 mg by mouth daily.    Historical Provider, MD  LORazepam (ATIVAN) 0.5 MG tablet Take 0.5 mg by mouth every 8 (eight) hours as needed for anxiety.  07/11/15   Historical Provider, MD  magnesium oxide (MAG-OX) 400 MG tablet Take 1 tablet (400 mg total) by mouth daily. 04/29/15   Christina  P Rama, MD  metolazone (ZAROXOLYN) 2.5 MG tablet Take 1 tablet (2.5 mg total) by mouth daily as needed (for leg swellings or shortness of breath). Patient taking differently: Take 2.5 mg by mouth daily.  04/12/14   Nishant Dhungel, MD  metoprolol tartrate (LOPRESSOR) 25 MG tablet Take 1 tablet (25 mg total) by mouth 2 (two) times daily. 10/03/13   Eugenie Filler, MD  Multiple Vitamin (MULTIVITAMIN WITH MINERALS) TABS tablet Take 1 tablet by mouth daily.    Historical Provider, MD  omeprazole (PRILOSEC) 40 MG capsule Take 40 mg by mouth  daily.      Historical Provider, MD  potassium chloride (K-DUR) 10 MEQ tablet Take 10 mEq by mouth daily. 08/12/14   Historical Provider, MD  promethazine (PHENERGAN) 25 MG tablet Take 1 tablet (25 mg total) by mouth every 6 (six) hours as needed for nausea or vomiting. 01/23/15   Margarita Mail, PA-C  torsemide (DEMADEX) 20 MG tablet Take 2 tablets (40 mg total) by mouth daily. Patient taking differently: Take 80 mg by mouth daily.  04/12/14   Nishant Dhungel, MD  traZODone (DESYREL) 50 MG tablet Take 50 mg by mouth at bedtime as needed for sleep.     Historical Provider, MD    ALLERGIES:  Allergies  Allergen Reactions  . Nsaids Other (See Comments)    Cannot take due to Kidney disease  . Tolmetin Other (See Comments)    Cannot take due to Kidney Disease    SOCIAL HISTORY:  Social History  Substance Use Topics  . Smoking status: Never Smoker  . Smokeless tobacco: Never Used  . Alcohol use No    FAMILY HISTORY: Family History  Problem Relation Age of Onset  . Hypertension Mother   . Thyroid disease Mother   . Coronary artery disease Father   . Hypertension Father   . Diabetes Father     EXAM: BP 106/68   Pulse 68   Temp 98.4 F (36.9 C) (Oral)   Resp 16   Ht $R'5\' 7"'pn$  (1.702 m)   Wt 240 lb (108.9 kg)   SpO2 99%   BMI 37.59 kg/m  CONSTITUTIONAL: Alert and oriented and responds appropriately to questions. Well-appearing; well-nourished, Afebrile and nontoxic HEAD: Normocephalic EYES: Conjunctivae clear, PERRL ENT: normal nose; no rhinorrhea; moist mucous membranes NECK: Supple, no meningismus, no LAD  CARD: RRR; S1 and S2 appreciated; no murmurs, no clicks, no rubs, no gallops CHEST:  Nontender to palpation without crepitus, ecchymosis or deformity, no rashes or other lesions. Breast tissue appears normal and there are no masses, areas of erythema or warmth, induration or fluctuance. No nipple discharge. No axillary lymphadenopathy. RESP: Normal chest excursion without  splinting or tachypnea; breath sounds clear and equal bilaterally; no wheezes, no rhonchi, no rales, no hypoxia or respiratory distress, speaking full sentences ABD/GI: Normal bowel sounds; non-distended; soft, non-tender, no rebound, no guarding, no peritoneal signs BACK:  The back appears normal and is non-tender to palpation, there is no CVA tenderness EXT: Normal ROM in all joints; non-tender to palpation; no edema; normal capillary refill; no cyanosis, no calf tenderness or swelling    SKIN: Normal color for age and race; warm; no rash NEURO: Moves all extremities equally, sensation to light touch intact diffusely, cranial nerves II through XII intact PSYCH: The patient's mood and manner are appropriate. Grooming and personal hygiene are appropriate.  MEDICAL DECISION MAKING: Patient here with very atypical chest pain. It appears to be musculoskeletal as she seems to  hurt more when she is moving but I am unable to reproduce it with palpation. She does have risk factors for ACS but recently had a low risk stress test. Bedside ultrasound shows no pericardial effusion with good ejection fraction. First troponin negative. EKG shows no new ischemic abdomen. Chest x-ray clear. Given she does have pleuritic pain, will obtain a d-dimer.  ED PROGRESS: Patient's pain has improved with morphine and Dilaudid. Second troponin is negative. D-dimer was positive. VQ scan pending.    Patient's VQ scan is negative. We have repeated a third troponin which was negative. Low suspicion for ACS at this time given negative workup. Doubt dissection. I feel she is safe to be discharged with close outpatient follow-up. We'll discharge with Percocet. Discussed return precautions with patient and she is comfortable with this plan.  Given subjective fevers and chills this could be pericarditis. Reportedly she cannot take high-dose NSAIDs because of her chronic kidney disease. No signs of myocarditis. Again this could be viral  in nature, chest wall pain as well.  At this time, I do not feel there is any life-threatening condition present. I have reviewed and discussed all results (EKG, imaging, lab, urine as appropriate), exam findings with patient/family. I have reviewed nursing notes and appropriate previous records.  I feel the patient is safe to be discharged home without further emergent workup and can continue workup as an outpatient as needed. Discussed usual and customary return precautions. Patient/family verbalize understanding and are comfortable with this plan.  Outpatient follow-up has been provided. All questions have been answered.    EKG Interpretation  Date/Time:  Wednesday September 04 2015 22:33:48 EDT Ventricular Rate:  73 PR Interval:  140 QRS Duration: 86 QT Interval:  394 QTC Calculation: 434 R Axis:   60 Text Interpretation:  Normal sinus rhythm Normal ECG No significant change since last tracing Confirmed by WARD,  DO, KRISTEN ([id number]) on 09/05/2015 1:44:51 AM         EMERGENCY DEPARTMENT [place] CARDIAC EXAM "Study: Limited Ultrasound of the heart and pericardium"  INDICATIONS:Dyspnea Multiple views of the heart and pericardium were obtained in real-time with a multi-frequency probe.  PERFORMED EK:BTCYEL  IMAGES ARCHIVED?: Yes  FINDINGS: No pericardial effusion  LIMITATIONS:  Body habitus  VIEWS USED: Parasternal long axis and short axis  INTERPRETATION: Pericardial effusioin absent  CPT Code: 85909-31 (limited transthoracic cardiac)  I personally performed the services described in this documentation, which was scribed in my presence. The recorded information has been reviewed and is accurate.    [place], DO 09/05/15 [place], DO 09/05/15 [phone]

## 2015-10-01 ENCOUNTER — Emergency Department (HOSPITAL_COMMUNITY)
Admission: EM | Admit: 2015-10-01 | Discharge: 2015-10-01 | Disposition: A | Discharge status: Home / Self Care | Payer: Medicare Other | Attending: Emergency Medicine | Admitting: Emergency Medicine

## 2015-10-01 ENCOUNTER — Emergency Department (HOSPITAL_COMMUNITY): Payer: Medicare Other

## 2015-10-01 ENCOUNTER — Encounter (HOSPITAL_COMMUNITY): Payer: Self-pay

## 2015-10-01 DIAGNOSIS — R1030 Lower abdominal pain, unspecified: Secondary | ICD-10-CM | POA: Diagnosis not present

## 2015-10-01 DIAGNOSIS — Z79899 Other long term (current) drug therapy: Secondary | ICD-10-CM | POA: Insufficient documentation

## 2015-10-01 DIAGNOSIS — N183 Chronic kidney disease, stage 3 (moderate): Secondary | ICD-10-CM | POA: Diagnosis not present

## 2015-10-01 DIAGNOSIS — R109 Unspecified abdominal pain: Secondary | ICD-10-CM | POA: Diagnosis present

## 2015-10-01 DIAGNOSIS — I129 Hypertensive chronic kidney disease with stage 1 through stage 4 chronic kidney disease, or unspecified chronic kidney disease: Secondary | ICD-10-CM | POA: Diagnosis not present

## 2015-10-01 DIAGNOSIS — E1122 Type 2 diabetes mellitus with diabetic chronic kidney disease: Secondary | ICD-10-CM | POA: Insufficient documentation

## 2015-10-01 DIAGNOSIS — Z7982 Long term (current) use of aspirin: Secondary | ICD-10-CM | POA: Diagnosis not present

## 2015-10-01 LAB — URINALYSIS, ROUTINE W REFLEX MICROSCOPIC
BILIRUBIN URINE: NEGATIVE
GLUCOSE, UA: 100 mg/dL — AB
Ketones, ur: NEGATIVE mg/dL
Leukocytes, UA: NEGATIVE
Nitrite: NEGATIVE
PH: 7 (ref 5.0–8.0)
Protein, ur: >300 mg/dL — AB
SPECIFIC GRAVITY, URINE: 1.015 (ref 1.005–1.030)

## 2015-10-01 LAB — LIPASE, BLOOD: LIPASE: 33 U/L (ref 11–51)

## 2015-10-01 LAB — COMPREHENSIVE METABOLIC PANEL
ALBUMIN: 3.1 g/dL — AB (ref 3.5–5.0)
ALK PHOS: 41 U/L (ref 38–126)
ALT: 17 U/L (ref 14–54)
ANION GAP: 12 (ref 5–15)
AST: 21 U/L (ref 15–41)
BUN: 71 mg/dL — ABNORMAL HIGH (ref 6–20)
CALCIUM: 8.4 mg/dL — AB (ref 8.9–10.3)
CHLORIDE: 100 mmol/L — AB (ref 101–111)
CO2: 27 mmol/L (ref 22–32)
Creatinine, Ser: 2.78 mg/dL — ABNORMAL HIGH (ref 0.44–1.00)
GFR calc Af Amer: 23 mL/min — ABNORMAL LOW (ref >60)
GFR calc non Af Amer: 20 mL/min — ABNORMAL LOW (ref >60)
GLUCOSE: 100 mg/dL — AB (ref 65–99)
POTASSIUM: 3 mmol/L — AB (ref 3.5–5.1)
SODIUM: 139 mmol/L (ref 135–145)
Total Bilirubin: 0.3 mg/dL (ref 0.3–1.2)
Total Protein: 6.2 g/dL — ABNORMAL LOW (ref 6.5–8.1)

## 2015-10-01 LAB — URINE MICROSCOPIC-ADD ON

## 2015-10-01 LAB — CBC
HEMATOCRIT: 32 % — AB (ref 36.0–46.0)
HEMOGLOBIN: 9.7 g/dL — AB (ref 12.0–15.0)
MCH: 24.5 pg — AB (ref 26.0–34.0)
MCHC: 30.3 g/dL (ref 30.0–36.0)
MCV: 80.8 fL (ref 78.0–100.0)
Platelets: 341 10*3/uL (ref 150–400)
RBC: 3.96 MIL/uL (ref 3.87–5.11)
RDW: 16.1 % — ABNORMAL HIGH (ref 11.5–15.5)
WBC: 5 10*3/uL (ref 4.0–10.5)

## 2015-10-01 MED ORDER — POTASSIUM CHLORIDE CRYS ER 20 MEQ PO TBCR
40.0000 meq | EXTENDED_RELEASE_TABLET | Freq: Once | ORAL | Status: AC
  Administered 2015-10-01: 40 meq via ORAL
  Filled 2015-10-01: qty 2

## 2015-10-01 MED ORDER — DICYCLOMINE HCL 10 MG PO CAPS
10.0000 mg | ORAL_CAPSULE | Freq: Once | ORAL | Status: AC
  Administered 2015-10-01: 10 mg via ORAL
  Filled 2015-10-01: qty 1

## 2015-10-01 NOTE — ED Provider Notes (Signed)
[facility] DEPT Provider Note   CSN: [id number] Arrival date & time: 10/01/15  1322     History   Chief Complaint Chief Complaint  Patient presents with  . Abdominal Pain    HPI Patricia [name] is a 40 y.o. female.  The history is provided by the patient. No language interpreter was used.  Abdominal Pain      Patricia [name] is a 40 y.o. female who presents to the Emergency Department complaining of abdominal pain.  She has a history of CK D was getting prepped for dialysis. She reports 3 days of lower abdominal pain that is described as a cramping and contraction-like pain. She has associated mild dysuria. She denies any fevers, vomiting, vaginal discharge. She has chronic intermittent nausea and endorses recent nausea. Pain is nonradiating. She has a hx/o hysterectomy and is not sexually active. No prior similar symptoms.  Past Medical History:  Diagnosis Date  . Acid reflux   . Chronic kidney disease (CKD), stage III (moderate)   . Headache(784.0)    "related to chemo; sometimes weekly" (09/12/2013)  . High cholesterol   . History of blood transfusion "a couple"   "related to low counts"  . Hypertension   . Iron deficiency anemia    "get epogen shots q month" (02/20/2014)  . 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 Type II diabetes mellitus ([place])    "took me off my RX ~ 04/2013"    Patient Active Problem List   Diagnosis Date Noted  . Hypomagnesemia 04/28/2015  . Acute renal failure superimposed on stage 3 chronic kidney disease ([place]) 04/12/2014  . Atypical chest pain   . Chest pain 02/19/2014  . Hypokalemia 02/19/2014  . Nephrotic syndrome 10/02/2013  . Dyslipidemia 10/02/2013  . PAT (paroxysmal atrial tachycardia) ([place]) 09/29/2013  . CKD stage 3 followed at [facility] 09/12/2013  . Symptomatic anemia 06/29/2013  . Edema 06/29/2013  . Fever, unspecified 01/31/2013  . Glomerulonephritis 01/28/2013  . Acute  renal failure ([place]) 01/28/2013  . Syncope 01/28/2013  . Chest pain with moderate risk of acute coronary syndrome 07/04/2011  . Shortness of breath 07/04/2011  . Morbid obesity ([place]) 07/04/2011  . HTN (hypertension) 07/04/2011  . DM (diabetes mellitus) ([place]) 07/04/2011  . Hypercholesterolemia 07/04/2011    Past Surgical History:  Procedure Laterality Date  . ABDOMINAL HYSTERECTOMY  2010   "laparoscopic"  . ANKLE FRACTURE SURGERY Right 1994  . CARDIAC CATHETERIZATION  2000's  . FRACTURE SURGERY      OB History    Gravida Para Term Preterm AB Living   3             SAB TAB Ectopic Multiple Live Births         3         Home Medications    Prior to Admission medications   Medication Sig Start Date End Date Taking? Authorizing Provider  amLODipine (NORVASC) 5 MG tablet Take 5 mg by mouth daily. 08/12/14  Yes Historical Provider, MD  aspirin 81 MG chewable tablet Chew 1 tablet (81 mg total) by mouth daily. Patient taking differently: Chew 81 mg by mouth every morning.  10/03/13  Yes Eugenie Filler, MD  atorvastatin (LIPITOR) 40 MG tablet Take 40 mg by mouth daily. 03/14/14  Yes Historical Provider, MD  diphenhydramine-acetaminophen (TYLENOL PM) 25-500 MG TABS tablet Take 2 tablets by mouth at bedtime as needed (for sleep).  Yes Historical Provider, MD  gabapentin (NEURONTIN) 300 MG capsule Take 300 mg by mouth daily. Dose reduced to daily in August 2017 06/12/15 06/11/16 Yes Historical Provider, MD  lisinopril (PRINIVIL,ZESTRIL) 10 MG tablet Take 10 mg by mouth daily.   Yes Historical Provider, MD  LORazepam (ATIVAN) 0.5 MG tablet Take 0.5 mg by mouth every 8 (eight) hours as needed for anxiety.  07/11/15  Yes Historical Provider, MD  magnesium oxide (MAG-OX) 400 MG tablet Take 1 tablet (400 mg total) by mouth daily. 04/29/15  Yes Maryruth Bun Rama, MD  metolazone (ZAROXOLYN) 2.5 MG tablet Take 1 tablet (2.5 mg total) by mouth daily as needed (for leg swellings or shortness of  breath). Patient taking differently: Take 2.5 mg by mouth daily.  04/12/14  Yes Nishant Dhungel, MD  metoprolol tartrate (LOPRESSOR) 25 MG tablet Take 1 tablet (25 mg total) by mouth 2 (two) times daily. 10/03/13  Yes Rodolph Bong, MD  Multiple Vitamin (MULTIVITAMIN WITH MINERALS) TABS tablet Take 1 tablet by mouth daily.   Yes Historical Provider, MD  omeprazole (PRILOSEC) 40 MG capsule Take 40 mg by mouth daily.     Yes Historical Provider, MD  oxyCODONE-acetaminophen (PERCOCET/ROXICET) 5-325 MG tablet Take 1-2 tablets by mouth every 6 (six) hours as needed. 09/05/15  Yes Kristen N Ward, DO  potassium chloride (K-DUR) 10 MEQ tablet Take 10 mEq by mouth daily. 08/12/14  Yes Historical Provider, MD  promethazine (PHENERGAN) 25 MG tablet Take 1 tablet (25 mg total) by mouth every 6 (six) hours as needed for nausea or vomiting. 01/23/15  Yes Arthor Captain, PA-C  sertraline (ZOLOFT) 25 MG tablet Take 25 mg by mouth daily.   Yes Historical Provider, MD  temazepam (RESTORIL) 15 MG capsule Take 15 mg by mouth at bedtime as needed for sleep.   Yes Historical Provider, MD  torsemide (DEMADEX) 20 MG tablet Take 2 tablets (40 mg total) by mouth daily. Patient taking differently: Take 80 mg by mouth daily.  04/12/14  Yes Nishant Dhungel, MD  traZODone (DESYREL) 50 MG tablet Take 50 mg by mouth at bedtime as needed for sleep.    Yes Historical Provider, MD  colchicine 0.6 MG tablet Take 0.6 mg by mouth every other day. 10/30/14 10/30/15  Historical Provider, MD    Family History Family History  Problem Relation Age of Onset  . Hypertension Mother   . Thyroid disease Mother   . Coronary artery disease Father   . Hypertension Father   . Diabetes Father     Social History Social History  Substance Use Topics  . Smoking status: Never Smoker  . Smokeless tobacco: Never Used  . Alcohol use No     Allergies   Nsaids and Tolmetin   Review of Systems Review of Systems  Gastrointestinal: Positive for  abdominal pain.  All other systems reviewed and are negative.    Physical Exam Updated Vital Signs BP 122/64   Pulse 62   Temp 97.8 F (36.6 C) (Oral)   Resp 18   Ht 5\' 7"  (1.702 m)   Wt 240 lb (108.9 kg)   SpO2 100%   BMI 37.59 kg/m   Physical Exam  Constitutional: She is oriented to person, place, and time. She appears well-developed and well-nourished.  HENT:  Head: Normocephalic and atraumatic.  Cardiovascular: Normal rate and regular rhythm.   No murmur heard. Pulmonary/Chest: Effort normal and breath sounds normal. No respiratory distress.  Abdominal: Soft. There is tenderness.  Mild to moderate  lower abdominal tenderness without guarding or rebound.    Musculoskeletal: She exhibits no edema or tenderness.  Neurological: She is alert and oriented to person, place, and time.  Skin: Skin is warm and dry.  Psychiatric: She has a normal mood and affect. Her behavior is normal.  Nursing note and vitals reviewed.    ED Treatments / Results  Labs (all labs ordered are listed, but only abnormal results are displayed) Labs Reviewed  COMPREHENSIVE METABOLIC PANEL - Abnormal; Notable for the following:       Result Value   Potassium 3.0 (*)    Chloride 100 (*)    Glucose, Bld 100 (*)    BUN 71 (*)    Creatinine, Ser 2.78 (*)    Calcium 8.4 (*)    Total Protein 6.2 (*)    Albumin 3.1 (*)    GFR calc non Af Amer 20 (*)    GFR calc Af Amer 23 (*)    All other components within normal limits  CBC - Abnormal; Notable for the following:    Hemoglobin 9.7 (*)    HCT 32.0 (*)    MCH 24.5 (*)    RDW 16.1 (*)    All other components within normal limits  URINALYSIS, ROUTINE W REFLEX MICROSCOPIC (NOT AT [facility]) - Abnormal; Notable for the following:    Glucose, UA 100 (*)    Hgb urine dipstick SMALL (*)    Protein, ur >300 (*)    All other components within normal limits  URINE MICROSCOPIC-ADD ON - Abnormal; Notable for the following:    Squamous Epithelial / LPF 0-5 (*)     Bacteria, UA FEW (*)    All other components within normal limits  URINE CULTURE  LIPASE, BLOOD    EKG  EKG Interpretation None       Radiology Ct Abdomen Pelvis Wo Contrast  Result Date: 10/01/2015 CLINICAL DATA:  Acute onset of lower pelvic pain and heaviness. Pelvic spasms. Initial encounter. EXAM: CT ABDOMEN AND PELVIS WITHOUT CONTRAST TECHNIQUE: Multidetector CT imaging of the abdomen and pelvis was performed following the standard protocol without IV contrast. COMPARISON:  CT of the abdomen and pelvis from 01/23/2015 FINDINGS: Lower chest: The visualized lung bases are grossly clear. The visualized portions of the mediastinum are unremarkable. Hepatobiliary: The liver is unremarkable in appearance. The gallbladder is unremarkable in appearance. The common bile duct remains normal in caliber. Pancreas: The pancreas is within normal limits. Spleen: The spleen is unremarkable in appearance. Adrenals/Urinary Tract: The adrenal glands are unremarkable in appearance. The kidneys are within normal limits. There is no evidence of hydronephrosis. No renal or ureteral stones are identified. No perinephric stranding is seen. Stomach/Bowel: The stomach is unremarkable in appearance. The small bowel is within normal limits. The appendix is normal in caliber, without evidence of appendicitis. Minimal diverticulosis is noted at the proximal sigmoid colon, without evidence of diverticulitis. Vascular/Lymphatic: The abdominal aorta is unremarkable in appearance. The inferior vena cava is grossly unremarkable. No retroperitoneal lymphadenopathy is seen. No pelvic sidewall lymphadenopathy is identified. Reproductive: The bladder is decompressed and not well assessed. The patient is status post hysterectomy. A small 2.1 cm cyst is noted at the vaginal cuff. No suspicious adnexal masses are seen. Other: A small periumbilical hernia is again noted, containing only fat. Musculoskeletal: No acute osseous  abnormalities are identified. Vacuum phenomenon is noted at the sacroiliac joints, with associated sclerosis. The visualized musculature is unremarkable in appearance. IMPRESSION: 1. No acute abnormality seen  within the abdomen or pelvis. 2. Small 2.1 cm cyst at the vaginal cuff may be physiologic in nature. 3. Minimal diverticulosis at the proximal sigmoid colon, without evidence of diverticulitis. Electronically Signed   By: Garald Balding M.D.   On: 10/01/2015 21:30    Procedures Procedures (including critical care time)  Medications Ordered in ED Medications  dicyclomine (BENTYL) capsule 10 mg (10 mg Oral Given 10/01/15 1914)  potassium chloride SA (K-DUR,KLOR-CON) CR tablet 40 mEq (40 mEq Oral Given 10/01/15 2203)     Initial Impression / Assessment and Plan / ED Course  I have reviewed the triage vital signs and the nursing notes.  Pertinent labs & imaging results that were available during my care of the patient were reviewed by me and considered in my medical decision making (see chart for details).  Clinical Course    Patient with chronic kidney disease here with intermittent lower abdominal pain. UA is not consistent with urinary tract infection. CT scan with no clear cause of her symptoms. Discussed with patient unclear etiology of her abdominal pain. Plan to DC home with close outpatient follow-up. Home care and return precautions were discussed.  Final Clinical Impressions(s) / ED Diagnoses   Final diagnoses:  Lower abdominal pain    New Prescriptions Discharge Medication List as of 10/01/2015  9:44 PM       Quintella Reichert, MD 10/02/15 0003

## 2015-10-01 NOTE — Discharge Instructions (Signed)
The cause of your abdominal pain was not identified today.  Get rechecked immediately if you develop any new or worrisome symptoms.  Your CT scan showed a 2 cm cyst on your vaginal cuff - follow up with your doctor for recheck.

## 2015-10-01 NOTE — ED Notes (Signed)
Pt returned from CT °

## 2015-10-01 NOTE — ED Notes (Signed)
Patient transported to CT 

## 2015-10-01 NOTE — ED Triage Notes (Signed)
Pt reports lower abd pain started Sunday. Pt also reports some hematuria. Pt is ESRD pt, preparing to start dialysis.

## 2015-10-03 LAB — URINE CULTURE: Culture: NO GROWTH

## 2015-10-14 IMAGING — US US ABDOMEN COMPLETE
1 series · 14 of 25 positions shown · non-contrast
Comparison: None.

CLINICAL DATA: Right upper quadrant pain for 6 days

EXAM:
ULTRASOUND ABDOMEN COMPLETE

[Series 1: us abdomen complete · 0.24mm/px · 14 of 75 slices shown]
[im 1/75]
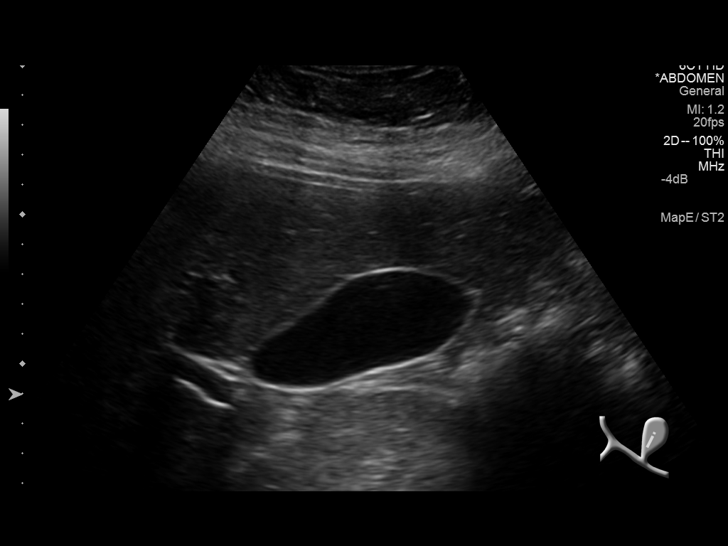
[im 7/75]
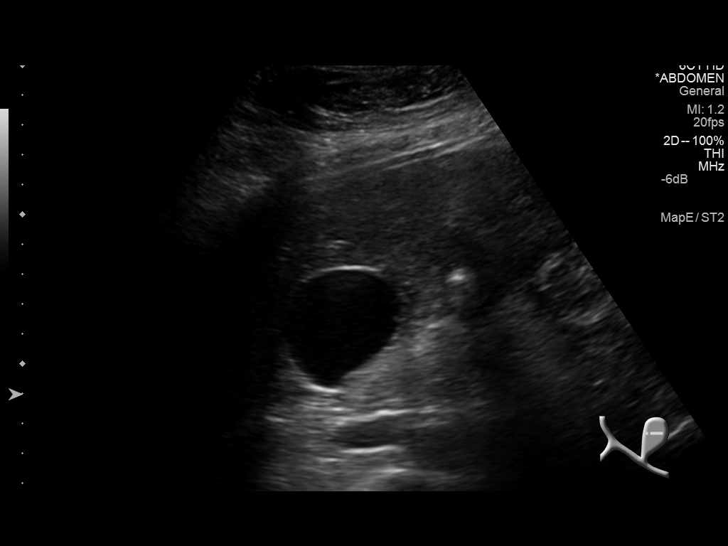
[im 13/75]
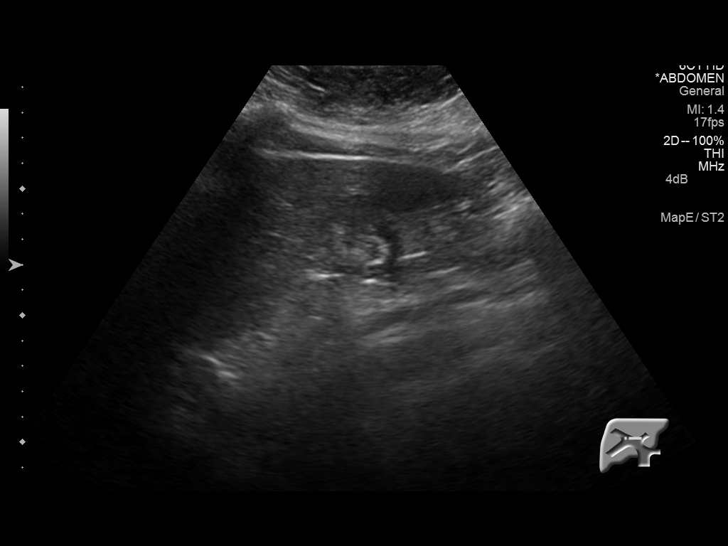
[im 19/75]
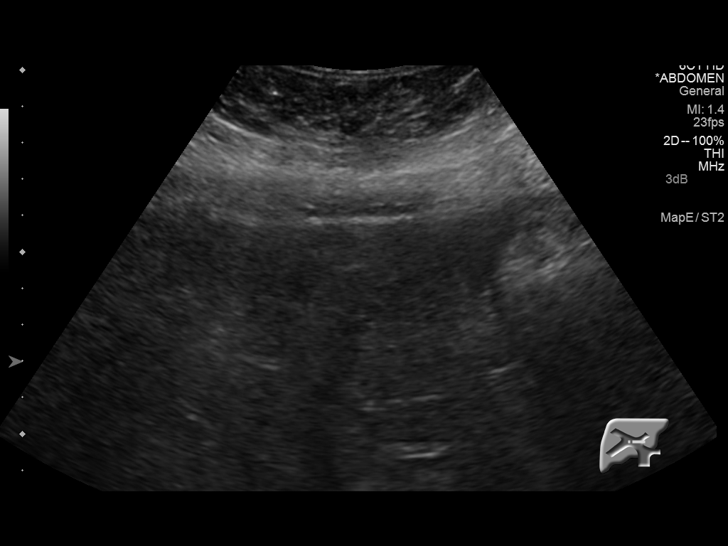
[im 25/75]
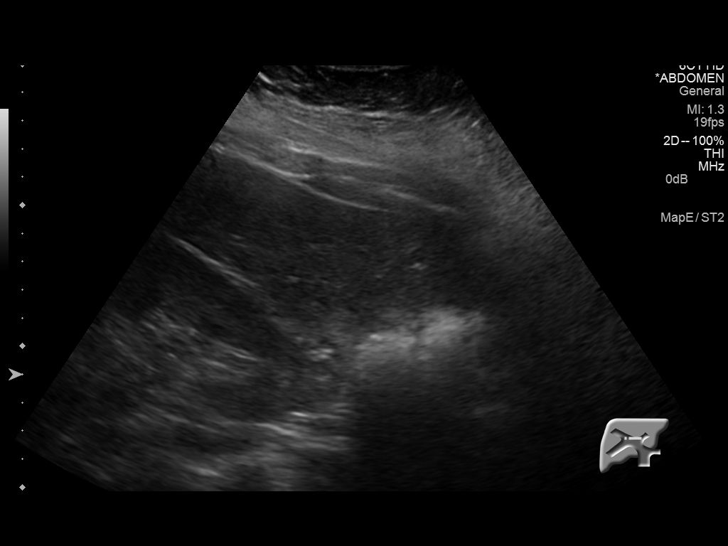
[im 28/75]
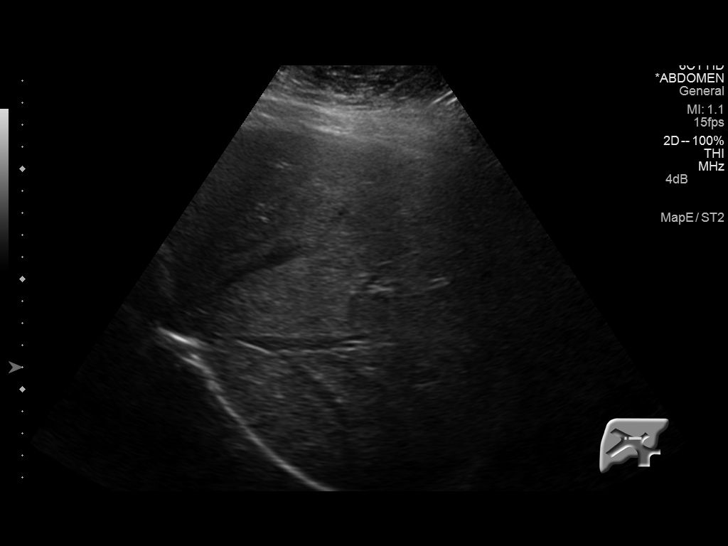
[im 34/75]
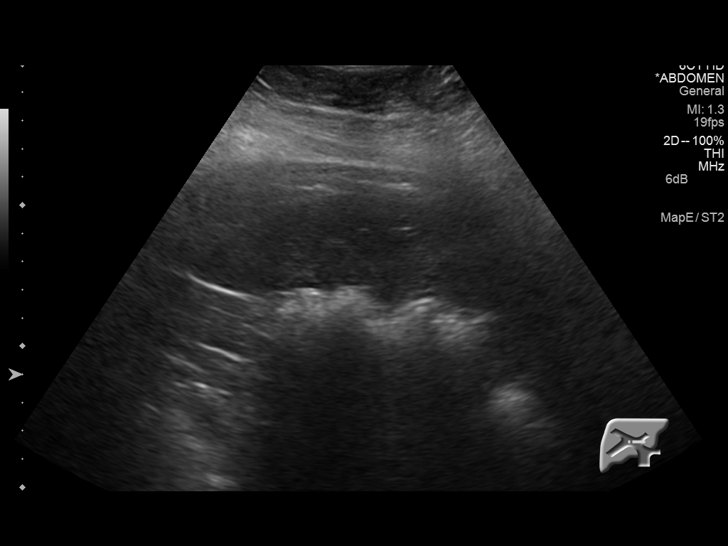
[im 41/75]
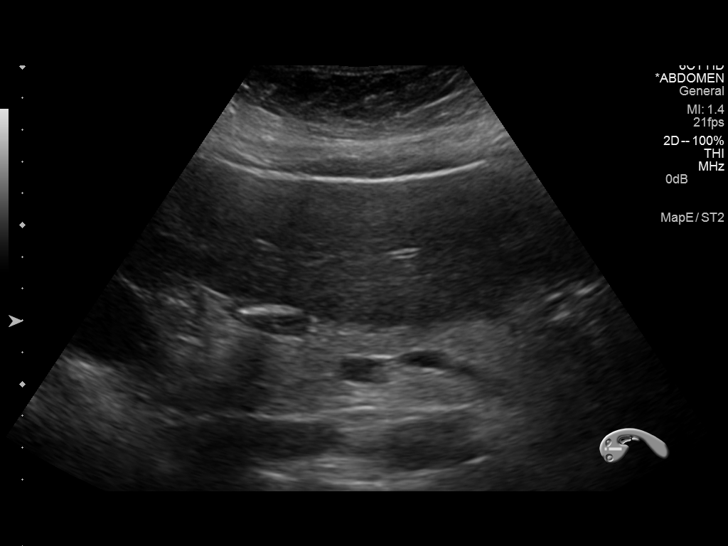
[im 47/75]
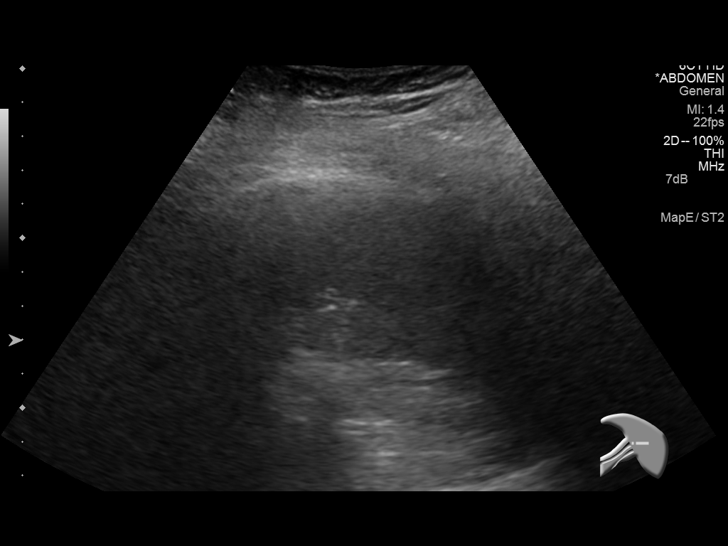
[im 50/75]
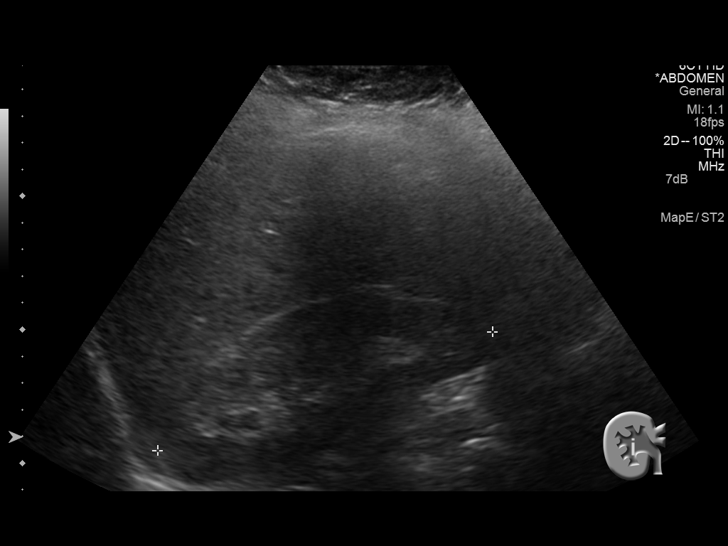
[im 56/75]
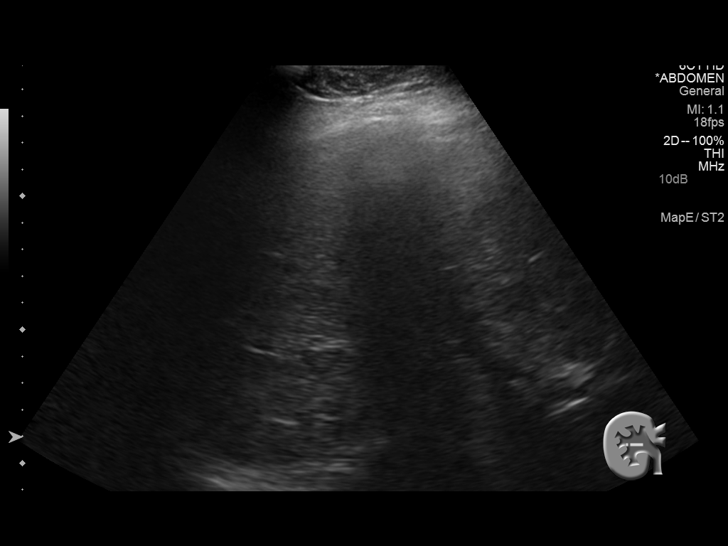
[im 62/75]
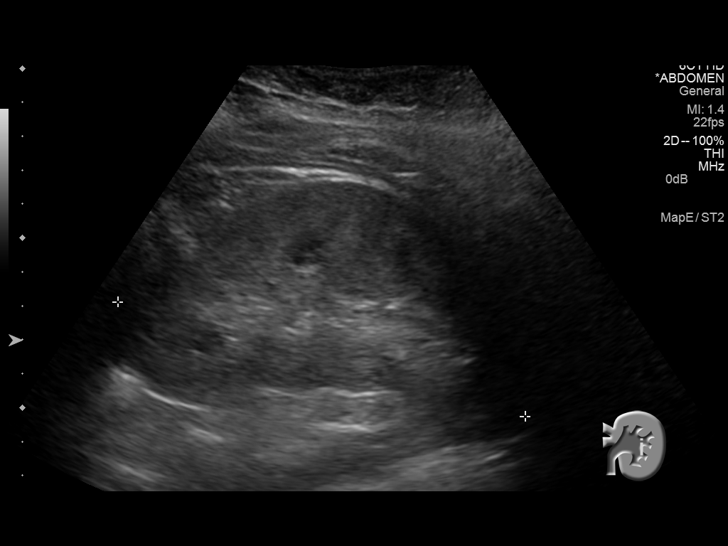
[im 68/75]
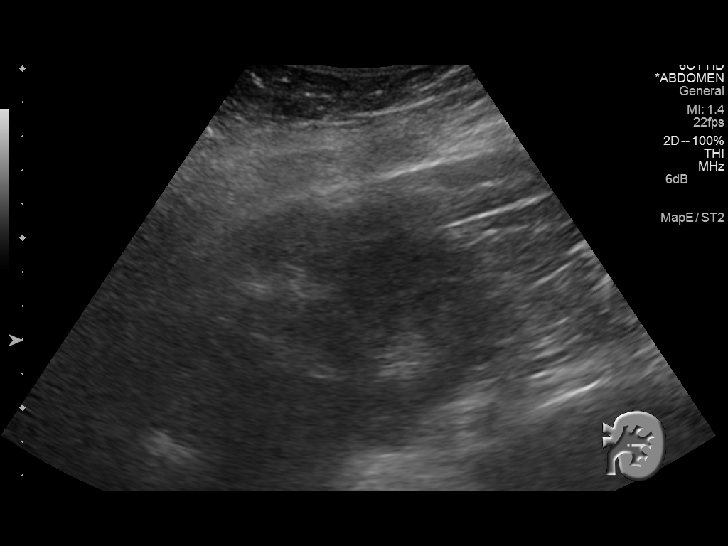
[im 75/75]
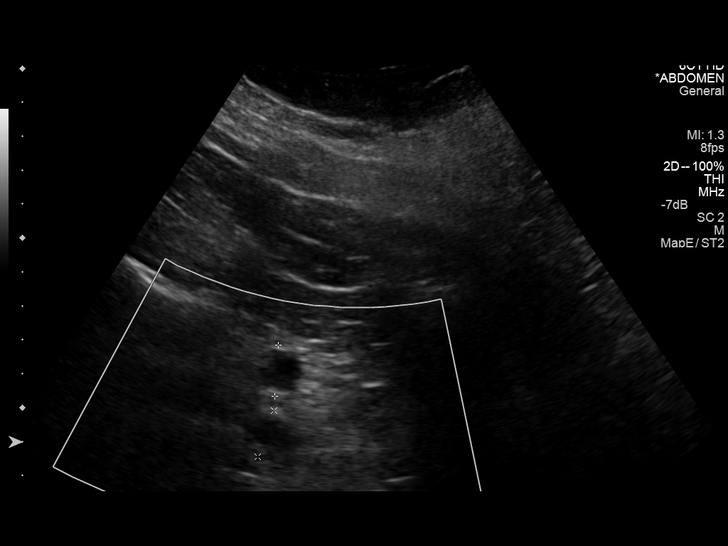

[14 of 25 positions shown; findings below may reference images not displayed]

FINDINGS: Gallbladder: Gallbladder is normal in appearance with no wall
thickening or stones. There is tenderness upon palpation over the
gallbladder consistent with positive Murphy's sign.

Common bile duct: Diameter: 4 mm

Liver: Mildly heterogeneous echotexture suggests possibility of
fatty infiltration

IVC: No abnormality visualized.

Pancreas: Visualized portion unremarkable.

Spleen: Size and appearance within normal limits.

Right Kidney: Length: 13 cm. Mildly echogenic cortex. Otherwise
negative.

Left Kidney: Length: 13 cm. Similar appearance to contralateral
kidney

Abdominal aorta: No aneurysm visualized.

Other findings: None.
IMPRESSION: 1. Evidence of medical renal disease
2. Positive sonographic Murphy sign with no other abnormalities
involving the gallbladder. Significance uncertain.

## 2015-10-18 ENCOUNTER — Encounter (HOSPITAL_COMMUNITY): Payer: Self-pay | Admitting: *Deleted

## 2015-10-18 DIAGNOSIS — R0789 Other chest pain: Secondary | ICD-10-CM | POA: Insufficient documentation

## 2015-10-18 DIAGNOSIS — I129 Hypertensive chronic kidney disease with stage 1 through stage 4 chronic kidney disease, or unspecified chronic kidney disease: Secondary | ICD-10-CM | POA: Diagnosis not present

## 2015-10-18 DIAGNOSIS — Z7982 Long term (current) use of aspirin: Secondary | ICD-10-CM | POA: Insufficient documentation

## 2015-10-18 DIAGNOSIS — N183 Chronic kidney disease, stage 3 (moderate): Secondary | ICD-10-CM | POA: Insufficient documentation

## 2015-10-18 DIAGNOSIS — Z79899 Other long term (current) drug therapy: Secondary | ICD-10-CM | POA: Diagnosis not present

## 2015-10-18 DIAGNOSIS — E876 Hypokalemia: Secondary | ICD-10-CM | POA: Diagnosis not present

## 2015-10-18 DIAGNOSIS — E1122 Type 2 diabetes mellitus with diabetic chronic kidney disease: Secondary | ICD-10-CM | POA: Insufficient documentation

## 2015-10-18 NOTE — ED Triage Notes (Signed)
Pt reports central non radiating chest pain starting around 2000. Pt reports shortness of breath, lightheadedness and dizziness with the chest pressure.

## 2015-10-19 ENCOUNTER — Emergency Department (HOSPITAL_COMMUNITY): Payer: Medicare Other

## 2015-10-19 ENCOUNTER — Emergency Department (HOSPITAL_COMMUNITY)
Admission: EM | Admit: 2015-10-19 | Discharge: 2015-10-19 | Disposition: A | Discharge status: Home / Self Care | Payer: Medicare Other | Attending: Emergency Medicine | Admitting: Emergency Medicine

## 2015-10-19 DIAGNOSIS — R0789 Other chest pain: Secondary | ICD-10-CM | POA: Diagnosis not present

## 2015-10-19 DIAGNOSIS — E876 Hypokalemia: Secondary | ICD-10-CM

## 2015-10-19 LAB — BASIC METABOLIC PANEL
Anion gap: 14 (ref 5–15)
BUN: 59 mg/dL — ABNORMAL HIGH (ref 6–20)
CALCIUM: 8.2 mg/dL — AB (ref 8.9–10.3)
CO2: 25 mmol/L (ref 22–32)
CREATININE: 3.26 mg/dL — AB (ref 0.44–1.00)
Chloride: 98 mmol/L — ABNORMAL LOW (ref 101–111)
GFR, EST AFRICAN AMERICAN: 19 mL/min — AB (ref >60)
GFR, EST NON AFRICAN AMERICAN: 16 mL/min — AB (ref >60)
Glucose, Bld: 142 mg/dL — ABNORMAL HIGH (ref 65–99)
Potassium: 2.6 mmol/L — CL (ref 3.5–5.1)
SODIUM: 137 mmol/L (ref 135–145)

## 2015-10-19 LAB — CBC
HCT: 30.6 % — ABNORMAL LOW (ref 36.0–46.0)
Hemoglobin: 9.8 g/dL — ABNORMAL LOW (ref 12.0–15.0)
MCH: 25 pg — AB (ref 26.0–34.0)
MCHC: 32 g/dL (ref 30.0–36.0)
MCV: 78.1 fL (ref 78.0–100.0)
PLATELETS: 421 10*3/uL — AB (ref 150–400)
RBC: 3.92 MIL/uL (ref 3.87–5.11)
RDW: 16 % — ABNORMAL HIGH (ref 11.5–15.5)
WBC: 6.6 10*3/uL (ref 4.0–10.5)

## 2015-10-19 LAB — I-STAT TROPONIN, ED
Troponin i, poc: 0 ng/mL (ref 0.00–0.08)
Troponin i, poc: 0 ng/mL (ref 0.00–0.08)

## 2015-10-19 MED ORDER — GI COCKTAIL ~~LOC~~
30.0000 mL | Freq: Once | ORAL | Status: AC
  Administered 2015-10-19: 30 mL via ORAL
  Filled 2015-10-19: qty 30

## 2015-10-19 MED ORDER — POTASSIUM CHLORIDE CRYS ER 20 MEQ PO TBCR
60.0000 meq | EXTENDED_RELEASE_TABLET | Freq: Once | ORAL | Status: AC
  Administered 2015-10-19: 60 meq via ORAL
  Filled 2015-10-19: qty 3

## 2015-10-19 NOTE — ED Provider Notes (Addendum)
[place] DEPT Provider Note   CSN: [id number] Arrival date & time: 10/18/15  2343  By signing my name below, I, Reola Mosher, attest that this documentation has been prepared under the direction and in the presence of Tyshawna Alarid, MD. Electronically Signed: Reola Mosher, ED Scribe. 10/19/15. 1:16 AM.  History   Chief Complaint Chief Complaint  Patient presents with  . Chest Pain   The history is provided by the patient. No language interpreter was used.  Chest Pain   This is a new problem. The current episode started 1 to 2 hours ago. The problem occurs constantly. The problem has not changed since onset.The pain is associated with rest. The pain is present in the lateral region. The pain is at a severity of 5/10. The pain is moderate. The quality of the pain is described as pressure-like. The pain does not radiate. Pertinent negatives include no abdominal pain, no cough, no fever, no numbness, no palpitations, no shortness of breath and no vomiting. Nausea: baseline. She has tried nothing for the symptoms. The treatment provided no relief. Risk factors include obesity, lack of exercise and sedentary lifestyle.  Her past medical history is significant for diabetes.  Her family medical history is significant for CAD, diabetes and hypertension.  Procedure history is positive for cardiac catheterization.   HPI Comments: Patricia [name] is a 40 y.o. female with a PMHx of CKD stage 3, DM2, cardiac catheterization, and PAT, who presents to the Emergency Department complaining of constant, left-sided chest pain onset ~2 hours ago. She describes her pain as pressure-like and denies any radiation of pain. Pt was sitting during the onset of her pain.  Also notes that her mouth has had a bitter-taste all day today. Pt additionally states that she has mild nausea at baseline due to her CKD medication; however, she reports that it has not acutely changed recently. Her onset of pain  was not exacerbated with eating. She notes that her Nephrologist has recently been urging her to monitor her BP as it has been trending upwards over the past several months. Pt checked her BP just prior to coming into the ED and states that it was running at 189/118. Pt confirms her h/o GERD and states that she has been compliant with her daily medication recently. No treatments were tried for her pain prior to coming into the ED. Denies abdominal pain, vomiting, cough, congestion, wheezing, fevers, or any other associated symptoms.   Past Medical History:  Diagnosis Date  . Acid reflux   . Chronic kidney disease (CKD), stage III (moderate)   . Headache(784.0)    "related to chemo; sometimes weekly" (09/12/2013)  . High cholesterol   . History of blood transfusion "a couple"   "related to low counts"  . Hypertension   . Iron deficiency anemia    "get epogen shots q month" (02/20/2014)  . 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 Type II diabetes mellitus ([place])    "took me off my RX ~ 04/2013"   Patient Active Problem List   Diagnosis Date Noted  . Hypomagnesemia 04/28/2015  . Acute renal failure superimposed on stage 3 chronic kidney disease ([place]) 04/12/2014  . Atypical chest pain   . Chest pain 02/19/2014  . Hypokalemia 02/19/2014  . Nephrotic syndrome 10/02/2013  . Dyslipidemia 10/02/2013  . PAT (paroxysmal atrial tachycardia) ([place]) 09/29/2013  . CKD stage 3 followed at [facility] 09/12/2013  .  Symptomatic anemia 06/29/2013  . Edema 06/29/2013  . Fever, unspecified 01/31/2013  . Glomerulonephritis 01/28/2013  . Acute renal failure ([place]) 01/28/2013  . Syncope 01/28/2013  . Chest pain with moderate risk of acute coronary syndrome 07/04/2011  . Shortness of breath 07/04/2011  . Morbid obesity ([place]) 07/04/2011  . HTN (hypertension) 07/04/2011  . DM (diabetes mellitus) ([place]) 07/04/2011  . Hypercholesterolemia 07/04/2011   Past  Surgical History:  Procedure Laterality Date  . ABDOMINAL HYSTERECTOMY  2010   "laparoscopic"  . ANKLE FRACTURE SURGERY Right 1994  . CARDIAC CATHETERIZATION  2000's  . FRACTURE SURGERY     OB History    Gravida Para Term Preterm AB Living   3             SAB TAB Ectopic Multiple Live Births         3       Home Medications    Prior to Admission medications   Medication Sig Start Date End Date Taking? Authorizing Provider  amLODipine (NORVASC) 5 MG tablet Take 5 mg by mouth daily. 08/12/14   Historical Provider, MD  aspirin 81 MG chewable tablet Chew 1 tablet (81 mg total) by mouth daily. Patient taking differently: Chew 81 mg by mouth every morning.  10/03/13   Eugenie Filler, MD  atorvastatin (LIPITOR) 40 MG tablet Take 40 mg by mouth daily. 03/14/14   Historical Provider, MD  colchicine 0.6 MG tablet Take 0.6 mg by mouth every other day. 10/30/14 10/30/15  Historical Provider, MD  diphenhydramine-acetaminophen (TYLENOL PM) 25-500 MG TABS tablet Take 2 tablets by mouth at bedtime as needed (for sleep).     Historical Provider, MD  gabapentin (NEURONTIN) 300 MG capsule Take 300 mg by mouth daily. Dose reduced to daily in August 2017 06/12/15 06/11/16  Historical Provider, MD  lisinopril (PRINIVIL,ZESTRIL) 10 MG tablet Take 10 mg by mouth daily.    Historical Provider, MD  LORazepam (ATIVAN) 0.5 MG tablet Take 0.5 mg by mouth every 8 (eight) hours as needed for anxiety.  07/11/15   Historical Provider, MD  magnesium oxide (MAG-OX) 400 MG tablet Take 1 tablet (400 mg total) by mouth daily. 04/29/15   Venetia Maxon Rama, MD  metolazone (ZAROXOLYN) 2.5 MG tablet Take 1 tablet (2.5 mg total) by mouth daily as needed (for leg swellings or shortness of breath). Patient taking differently: Take 2.5 mg by mouth daily.  04/12/14   Nishant Dhungel, MD  metoprolol tartrate (LOPRESSOR) 25 MG tablet Take 1 tablet (25 mg total) by mouth 2 (two) times daily. 10/03/13   Eugenie Filler, MD  Multiple Vitamin  (MULTIVITAMIN WITH MINERALS) TABS tablet Take 1 tablet by mouth daily.    Historical Provider, MD  omeprazole (PRILOSEC) 40 MG capsule Take 40 mg by mouth daily.      Historical Provider, MD  oxyCODONE-acetaminophen (PERCOCET/ROXICET) 5-325 MG tablet Take 1-2 tablets by mouth every 6 (six) hours as needed. 09/05/15   Kristen N Ward, DO  potassium chloride (K-DUR) 10 MEQ tablet Take 10 mEq by mouth daily. 08/12/14   Historical Provider, MD  promethazine (PHENERGAN) 25 MG tablet Take 1 tablet (25 mg total) by mouth every 6 (six) hours as needed for nausea or vomiting. 01/23/15   Margarita Mail, PA-C  sertraline (ZOLOFT) 25 MG tablet Take 25 mg by mouth daily.    Historical Provider, MD  temazepam (RESTORIL) 15 MG capsule Take 15 mg by mouth at bedtime as needed for sleep.    Historical Provider,  MD  torsemide (DEMADEX) 20 MG tablet Take 2 tablets (40 mg total) by mouth daily. Patient taking differently: Take 80 mg by mouth daily.  04/12/14   Nishant Dhungel, MD  traZODone (DESYREL) 50 MG tablet Take 50 mg by mouth at bedtime as needed for sleep.     Historical Provider, MD   Family History Family History  Problem Relation Age of Onset  . Hypertension Mother   . Thyroid disease Mother   . Coronary artery disease Father   . Hypertension Father   . Diabetes Father    Social History Social History  Substance Use Topics  . Smoking status: Never Smoker  . Smokeless tobacco: Never Used  . Alcohol use No   Allergies   Nsaids and Tolmetin  Review of Systems Review of Systems  Constitutional: Negative for fever.  HENT: Negative for congestion.   Respiratory: Negative for cough, shortness of breath, wheezing and stridor.   Cardiovascular: Positive for chest pain. Negative for palpitations and leg swelling.  Gastrointestinal: Negative for abdominal pain and vomiting. Nausea: baseline.  Neurological: Negative for numbness.  All other systems reviewed and are negative.  Physical Exam Updated Vital  Signs BP 144/88 (BP Location: Right Arm)   Pulse 82   Temp 98.1 F (36.7 C) (Oral)   Resp 20   SpO2 100%   Physical Exam  Constitutional: She appears well-developed and well-nourished.  HENT:  Head: Normocephalic.  Mouth/Throat: Oropharynx is clear and moist. No oropharyngeal exudate.  Eyes: Conjunctivae and EOM are normal. Pupils are equal, round, and reactive to light. Right eye exhibits no discharge. Left eye exhibits no discharge. No scleral icterus.  Neck: Normal range of motion. Neck supple. No JVD present. No tracheal deviation present.  Trachea is midline. No stridor or carotid bruits.   Cardiovascular: Normal rate, regular rhythm, normal heart sounds and intact distal pulses.   No murmur heard. Pulmonary/Chest: Effort normal and breath sounds normal. No stridor. No respiratory distress. She has no wheezes. She has no rales.  Lungs CTA bilaterally.  Abdominal: Soft. Bowel sounds are normal. She exhibits no distension. There is no tenderness. There is no rebound and no guarding.  Musculoskeletal: Normal range of motion. She exhibits no edema or tenderness.  All compartments are soft. No palpable cords.   Lymphadenopathy:    She has no cervical adenopathy.  Neurological: She is alert. She has normal reflexes. She displays normal reflexes. She exhibits normal muscle tone.  Skin: Skin is warm and dry. Capillary refill takes less than 2 seconds.  Psychiatric: She has a normal mood and affect. Her behavior is normal.  Nursing note and vitals reviewed.  ED Treatments / Results  DIAGNOSTIC STUDIES: Oxygen Saturation is 100% on RA, normal by my interpretation.   COORDINATION OF CARE: 1:16 AM-Discussed next steps with pt. Pt verbalized understanding and is agreeable with the plan.  Results for orders placed or performed during the hospital encounter of 62/03/55  Basic metabolic panel  Result Value Ref Range   Sodium 137 135 - 145 mmol/L   Potassium 2.6 (LL) 3.5 - 5.1 mmol/L    Chloride 98 (L) 101 - 111 mmol/L   CO2 25 22 - 32 mmol/L   Glucose, Bld 142 (H) 65 - 99 mg/dL   BUN 59 (H) 6 - 20 mg/dL   Creatinine, Ser 3.26 (H) 0.44 - 1.00 mg/dL   Calcium 8.2 (L) 8.9 - 10.3 mg/dL   GFR calc non Af Amer 16 (L) >60 mL/min  GFR calc Af Amer 19 (L) >60 mL/min   Anion gap 14 5 - 15  CBC  Result Value Ref Range   WBC 6.6 4.0 - 10.5 K/uL   RBC 3.92 3.87 - 5.11 MIL/uL   Hemoglobin 9.8 (L) 12.0 - 15.0 g/dL   HCT 70.1 (L) 10.4 - 38.1 %   MCV 78.1 78.0 - 100.0 fL   MCH 25.0 (L) 26.0 - 34.0 pg   MCHC 32.0 30.0 - 36.0 g/dL   RDW 34.9 (H) 75.3 - 04.5 %   Platelets 421 (H) 150 - 400 K/uL  I-stat troponin, ED  Result Value Ref Range   Troponin i, poc 0.00 0.00 - 0.08 ng/mL   Comment 3          I-stat troponin, ED  Result Value Ref Range   Troponin i, poc 0.00 0.00 - 0.08 ng/mL   Comment 3           Ct Abdomen Pelvis Wo Contrast  Result Date: 10/01/2015 CLINICAL DATA:  Acute onset of lower pelvic pain and heaviness. Pelvic spasms. Initial encounter. EXAM: CT ABDOMEN AND PELVIS WITHOUT CONTRAST TECHNIQUE: Multidetector CT imaging of the abdomen and pelvis was performed following the standard protocol without IV contrast. COMPARISON:  CT of the abdomen and pelvis from 01/23/2015 FINDINGS: Lower chest: The visualized lung bases are grossly clear. The visualized portions of the mediastinum are unremarkable. Hepatobiliary: The liver is unremarkable in appearance. The gallbladder is unremarkable in appearance. The common bile duct remains normal in caliber. Pancreas: The pancreas is within normal limits. Spleen: The spleen is unremarkable in appearance. Adrenals/Urinary Tract: The adrenal glands are unremarkable in appearance. The kidneys are within normal limits. There is no evidence of hydronephrosis. No renal or ureteral stones are identified. No perinephric stranding is seen. Stomach/Bowel: The stomach is unremarkable in appearance. The small bowel is within normal limits. The  appendix is normal in caliber, without evidence of appendicitis. Minimal diverticulosis is noted at the proximal sigmoid colon, without evidence of diverticulitis. Vascular/Lymphatic: The abdominal aorta is unremarkable in appearance. The inferior vena cava is grossly unremarkable. No retroperitoneal lymphadenopathy is seen. No pelvic sidewall lymphadenopathy is identified. Reproductive: The bladder is decompressed and not well assessed. The patient is status post hysterectomy. A small 2.1 cm cyst is noted at the vaginal cuff. No suspicious adnexal masses are seen. Other: A small periumbilical hernia is again noted, containing only fat. Musculoskeletal: No acute osseous abnormalities are identified. Vacuum phenomenon is noted at the sacroiliac joints, with associated sclerosis. The visualized musculature is unremarkable in appearance. IMPRESSION: 1. No acute abnormality seen within the abdomen or pelvis. 2. Small 2.1 cm cyst at the vaginal cuff may be physiologic in nature. 3. Minimal diverticulosis at the proximal sigmoid colon, without evidence of diverticulitis. Electronically Signed   By: Roanna Raider M.D.   On: 10/01/2015 21:30   Dg Chest 2 View  Result Date: 10/19/2015 CLINICAL DATA:  Central nonradiating chest pain EXAM: CHEST  2 VIEW COMPARISON:  09/04/2015 FINDINGS: The heart size and mediastinal contours are within normal limits. Both lungs are clear. The visualized skeletal structures are unremarkable. IMPRESSION: No active cardiopulmonary disease. Electronically Signed   By: Jasmine Pang M.D.   On: 10/19/2015 00:43   Radiology Dg Chest 2 View  Result Date: 10/19/2015 CLINICAL DATA:  Central nonradiating chest pain EXAM: CHEST  2 VIEW COMPARISON:  09/04/2015 FINDINGS: The heart size and mediastinal contours are within normal limits. Both lungs are clear. The visualized  skeletal structures are unremarkable. IMPRESSION: No active cardiopulmonary disease. Electronically Signed   By: Donavan Foil M.D.   On: 10/19/2015 00:43    EKG Interpretation  Date/Time:  Friday October 18 2015 23:46:30 EDT Ventricular Rate:  91 PR Interval:  150 QRS Duration: 90 QT Interval:  382 QTC Calculation: 469 R Axis:   46 Text Interpretation:  Normal sinus rhythm Confirmed by [facility]  MD, Arizbeth Cawthorn ([id number]) on 10/19/2015 3:49:57 AM       Procedures Procedures (including critical care time)  Medications Ordered in ED Medications  gi cocktail (Maalox,Lidocaine,Donnatal) (30 mLs Oral Given 10/19/15 0150)  potassium chloride SA (K-DUR,KLOR-CON) CR tablet 60 mEq (60 mEq Oral Given 10/19/15 0150)     Initial Impression / Assessment and Plan / ED Course  I have reviewed the triage vital signs and the nursing notes.  Pertinent labs & imaging results that were available during my care of the patient were reviewed by me and considered in my medical decision making (see chart for details).  Final Clinical Impressions(s) / ED Diagnoses   PERC negative wells 0 highly doubt PE in this low risk patient.  Ruled out for MI with negative EKG ans 2 normal troponins.  Increase potassium content in your food and you will need a recheck of your potassium in 2 days.  You will need follow up with your nephologist regarding increase in your creatinine.  Patient verbalizes understanding and agrees to follow up.  Suspect his is GERD given bitter tast in the mouth.    New Prescriptions New Prescriptions   No medications on file  All questions answered to patient's satisfaction. Based on history and exam patient has been appropriately medically screened and emergency conditions excluded. Patient is stable for discharge at this time. Follow up with your PMD for recheck in 2 days and strict return precautions given.  I personally performed the services described in this documentation, which was scribed in my presence. The recorded information has been reviewed and is accurate.       Veatrice Kells,  MD 10/19/15 0413    Jaidah Lomax, MD 10/19/15 [id number]

## 2015-10-19 NOTE — ED Notes (Signed)
Patient Alert and oriented X4. Stable and ambulatory. Patient verbalized understanding of the discharge instructions.  Patient belongings were taken by the patient.  

## 2015-11-20 ENCOUNTER — Emergency Department (HOSPITAL_COMMUNITY): Payer: Medicare Other

## 2015-11-20 ENCOUNTER — Encounter (HOSPITAL_COMMUNITY): Payer: Self-pay | Admitting: Emergency Medicine

## 2015-11-20 DIAGNOSIS — E119 Type 2 diabetes mellitus without complications: Secondary | ICD-10-CM | POA: Insufficient documentation

## 2015-11-20 DIAGNOSIS — Z79899 Other long term (current) drug therapy: Secondary | ICD-10-CM | POA: Insufficient documentation

## 2015-11-20 DIAGNOSIS — R002 Palpitations: Secondary | ICD-10-CM | POA: Insufficient documentation

## 2015-11-20 DIAGNOSIS — Z7982 Long term (current) use of aspirin: Secondary | ICD-10-CM | POA: Insufficient documentation

## 2015-11-20 DIAGNOSIS — I129 Hypertensive chronic kidney disease with stage 1 through stage 4 chronic kidney disease, or unspecified chronic kidney disease: Secondary | ICD-10-CM | POA: Insufficient documentation

## 2015-11-20 DIAGNOSIS — N183 Chronic kidney disease, stage 3 (moderate): Secondary | ICD-10-CM | POA: Diagnosis not present

## 2015-11-20 LAB — I-STAT TROPONIN, ED: TROPONIN I, POC: 0 ng/mL (ref 0.00–0.08)

## 2015-11-20 LAB — BASIC METABOLIC PANEL
Anion gap: 10 (ref 5–15)
BUN: 90 mg/dL — ABNORMAL HIGH (ref 6–20)
CALCIUM: 8.1 mg/dL — AB (ref 8.9–10.3)
CO2: 19 mmol/L — AB (ref 22–32)
CREATININE: 4 mg/dL — AB (ref 0.44–1.00)
Chloride: 108 mmol/L (ref 101–111)
GFR calc non Af Amer: 13 mL/min — ABNORMAL LOW (ref >60)
GFR, EST AFRICAN AMERICAN: 15 mL/min — AB (ref >60)
GLUCOSE: 100 mg/dL — AB (ref 65–99)
Potassium: 4.1 mmol/L (ref 3.5–5.1)
Sodium: 137 mmol/L (ref 135–145)

## 2015-11-20 LAB — CBC
HCT: 25.9 % — ABNORMAL LOW (ref 36.0–46.0)
Hemoglobin: 8.4 g/dL — ABNORMAL LOW (ref 12.0–15.0)
MCH: 24.6 pg — AB (ref 26.0–34.0)
MCHC: 32.4 g/dL (ref 30.0–36.0)
MCV: 75.7 fL — ABNORMAL LOW (ref 78.0–100.0)
PLATELETS: 261 10*3/uL (ref 150–400)
RBC: 3.42 MIL/uL — AB (ref 3.87–5.11)
RDW: 15.3 % (ref 11.5–15.5)
WBC: 7.6 10*3/uL (ref 4.0–10.5)

## 2015-11-20 NOTE — ED Triage Notes (Signed)
C/o intermittent palpitations all day.  Reports intermittent pain to center of chest since this afternoon with sob.  Reports chronic nausea.

## 2015-11-21 ENCOUNTER — Emergency Department (HOSPITAL_COMMUNITY)
Admission: EM | Admit: 2015-11-21 | Discharge: 2015-11-21 | Disposition: A | Discharge status: Home / Self Care | Payer: Medicare Other | Attending: Emergency Medicine | Admitting: Emergency Medicine

## 2015-11-21 DIAGNOSIS — R002 Palpitations: Secondary | ICD-10-CM | POA: Diagnosis not present

## 2015-11-21 DIAGNOSIS — N189 Chronic kidney disease, unspecified: Secondary | ICD-10-CM

## 2015-11-21 LAB — TROPONIN I

## 2015-11-21 NOTE — ED Provider Notes (Signed)
[facility] DEPT Provider Note   CSN: [id number] Arrival date & time: 11/20/15  2218  By signing my name below, I, Evelene Croon, attest that this documentation has been prepared under the direction and in the presence of Ezequiel Essex, MD . Electronically Signed: Evelene Croon, Scribe. 11/21/2015. 3:34 AM.  History   Chief Complaint Chief Complaint  Patient presents with  . Chest Pain  . Palpitations    The history is provided by the patient. No language interpreter was used.    HPI Comments:  Patricia [name] is a 40 y.o. female with a history of CKD, HTN and DM, who presents to the Emergency Department complaining of intermittent episodes of palpitations since yesterday (11/20/15).  She states she feels her heart fluttering and the episodes only last a few seconds. Pt reports associated mild chest pressure which followed the palpitations and has resolved at this time.  She reports a h/o similar palpitations in the past. She denies cardiac hx. No h/o PE/DVT. Pt also notes SOB with exertion which is a chronic occurrence for her but states it seems a bit worse than usual. She takes ASA daily. No recent changes to daily meds; notes she is compliant with these meds.   Past Medical History:  Diagnosis Date  . Acid reflux   . Chronic kidney disease (CKD), stage III (moderate)   . Headache(784.0)    "related to chemo; sometimes weekly" (09/12/2013)  . High cholesterol   . History of blood transfusion "a couple"   "related to low counts"  . Hypertension   . Iron deficiency anemia    "get epogen shots q month" (02/20/2014)  . 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 Type II diabetes mellitus ([place])    "took me off my RX ~ 04/2013"    Patient Active Problem List   Diagnosis Date Noted  . Hypomagnesemia 04/28/2015  . Acute renal failure superimposed on stage 3 chronic kidney disease ([place]) 04/12/2014  . Atypical chest pain    . Chest pain 02/19/2014  . Hypokalemia 02/19/2014  . Nephrotic syndrome 10/02/2013  . Dyslipidemia 10/02/2013  . PAT (paroxysmal atrial tachycardia) ([place]) 09/29/2013  . CKD stage 3 followed at [facility] 09/12/2013  . Symptomatic anemia 06/29/2013  . Edema 06/29/2013  . Fever, unspecified 01/31/2013  . Glomerulonephritis 01/28/2013  . Acute renal failure ([place]) 01/28/2013  . Syncope 01/28/2013  . Chest pain with moderate risk of acute coronary syndrome 07/04/2011  . Shortness of breath 07/04/2011  . Morbid obesity ([place]) 07/04/2011  . HTN (hypertension) 07/04/2011  . DM (diabetes mellitus) ([place]) 07/04/2011  . Hypercholesterolemia 07/04/2011    Past Surgical History:  Procedure Laterality Date  . ABDOMINAL HYSTERECTOMY  2010   "laparoscopic"  . ANKLE FRACTURE SURGERY Right 1994  . CARDIAC CATHETERIZATION  2000's  . FRACTURE SURGERY      OB History    Gravida Para Term Preterm AB Living   3             SAB TAB Ectopic Multiple Live Births         3         Home Medications    Prior to Admission medications   Medication Sig Start Date End Date Taking? Authorizing Provider  albuterol (PROVENTIL HFA;VENTOLIN HFA) 108 (90 Base) MCG/ACT inhaler Inhale 2 puffs into the lungs every 4 (four) hours as needed. 11/02/15  Yes Historical Provider, MD  amLODipine ([place])  5 MG tablet Take 5 mg by mouth daily. 08/12/14  Yes Historical Provider, MD  aspirin 81 MG chewable tablet Chew 1 tablet (81 mg total) by mouth daily. 10/03/13  Yes Eugenie Filler, MD  atorvastatin (LIPITOR) 40 MG tablet Take 40 mg by mouth daily. 03/14/14  Yes Historical Provider, MD  colchicine 0.6 MG tablet Take 0.6 mg by mouth every other day. 10/30/14 11/21/15 Yes Historical Provider, MD  diphenhydramine-acetaminophen (TYLENOL PM) 25-500 MG TABS tablet Take 2 tablets by mouth at bedtime as needed (for sleep).    Yes Historical Provider, MD  febuxostat (ULORIC) 40 MG tablet Take 40 mg by mouth daily. 08/08/15  Yes  Historical Provider, MD  gabapentin (NEURONTIN) 300 MG capsule Take 300 mg by mouth daily.  06/12/15 06/11/16 Yes Historical Provider, MD  lisinopril (PRINIVIL,ZESTRIL) 10 MG tablet Take 10 mg by mouth daily.   Yes Historical Provider, MD  LORazepam (ATIVAN) 0.5 MG tablet Take 0.5 mg by mouth every 8 (eight) hours as needed for anxiety.  07/11/15  Yes Historical Provider, MD  magnesium oxide (MAG-OX) 400 MG tablet Take 1 tablet (400 mg total) by mouth daily. 04/29/15  Yes Christina P Rama, MD  metolazone (ZAROXOLYN) 5 MG tablet Take 5 mg by mouth daily. 10/07/15  Yes Historical Provider, MD  metoprolol tartrate (LOPRESSOR) 25 MG tablet Take 1 tablet (25 mg total) by mouth 2 (two) times daily. 10/03/13  Yes Eugenie Filler, MD  Multiple Vitamin (MULTIVITAMIN WITH MINERALS) TABS tablet Take 1 tablet by mouth daily.   Yes Historical Provider, MD  omeprazole (PRILOSEC) 40 MG capsule Take 40 mg by mouth daily.     Yes Historical Provider, MD  oxyCODONE-acetaminophen (PERCOCET/ROXICET) 5-325 MG tablet Take 1-2 tablets by mouth every 6 (six) hours as needed. 09/05/15  Yes Kristen N Ward, DO  potassium chloride (K-DUR) 10 MEQ tablet Take 10 mEq by mouth daily. 08/12/14  Yes Historical Provider, MD  promethazine (PHENERGAN) 25 MG tablet Take 1 tablet (25 mg total) by mouth every 6 (six) hours as needed for nausea or vomiting. 01/23/15  Yes Margarita Mail, PA-C  sertraline (ZOLOFT) 25 MG tablet Take 25 mg by mouth daily.   Yes Historical Provider, MD  temazepam (RESTORIL) 15 MG capsule Take 15 mg by mouth at bedtime as needed for sleep.   Yes Historical Provider, MD  torsemide (DEMADEX) 20 MG tablet Take 2 tablets (40 mg total) by mouth daily. Patient taking differently: Take 80 mg by mouth daily.  04/12/14  Yes Nishant Dhungel, MD  traZODone (DESYREL) 50 MG tablet Take 50 mg by mouth at bedtime as needed for sleep.    Yes Historical Provider, MD  Vitamin D, Ergocalciferol, (DRISDOL) 50000 units CAPS capsule Take 50,000  Units by mouth once a week. 09/11/15 09/10/16 Yes Historical Provider, MD  metolazone (ZAROXOLYN) 2.5 MG tablet Take 1 tablet (2.5 mg total) by mouth daily as needed (for leg swellings or shortness of breath). Patient not taking: Reported on 11/21/2015 04/12/14   Louellen Molder, MD    Family History Family History  Problem Relation Age of Onset  . Hypertension Mother   . Thyroid disease Mother   . Coronary artery disease Father   . Hypertension Father   . Diabetes Father     Social History Social History  Substance Use Topics  . Smoking status: Never Smoker  . Smokeless tobacco: Never Used  . Alcohol use No     Allergies   Nsaids and Tolmetin   Review  of Systems Review of Systems  10 systems reviewed and all are negative for acute change except as noted in the HPI.   Physical Exam Updated Vital Signs BP 113/64   Pulse 79   Temp 98.6 F (37 C) (Oral)   Resp 22   Ht $R'5\' 7"'GT$  (1.702 m)   Wt 241 lb (109.3 kg)   SpO2 100%   BMI 37.75 kg/m   Physical Exam  Constitutional: She is oriented to person, place, and time. She appears well-developed and well-nourished. No distress.  HENT:  Head: Normocephalic and atraumatic.  Mouth/Throat: Oropharynx is clear and moist. No oropharyngeal exudate.  Eyes: Conjunctivae and EOM are normal. Pupils are equal, round, and reactive to light.  Neck: Normal range of motion. Neck supple.  No meningismus.  Cardiovascular: Normal rate, regular rhythm, normal heart sounds and intact distal pulses.   No murmur heard. Pulmonary/Chest: Effort normal and breath sounds normal. No respiratory distress. She exhibits no tenderness.  Abdominal: Soft. There is no tenderness. There is no rebound and no guarding.  Musculoskeletal: Normal range of motion. She exhibits no edema or tenderness.  AV fistula to left forearm with sutures intact; positive thrill   Neurological: She is alert and oriented to person, place, and time. No cranial nerve deficit. She  exhibits normal muscle tone. Coordination normal.  No ataxia on finger to nose bilaterally. No pronator drift. 5/5 strength throughout. CN 2-12 intact.Equal grip strength. Sensation intact.   Skin: Skin is warm.  Psychiatric: She has a normal mood and affect. Her behavior is normal.  Nursing note and vitals reviewed.   ED Treatments / Results  DIAGNOSTIC STUDIES:  Oxygen Saturation is 100% on RA, normal by my interpretation.    COORDINATION OF CARE:  3:30 AM Discussed treatment plan with pt at bedside and pt agreed to plan.  Labs (all labs ordered are listed, but only abnormal results are displayed) Labs Reviewed  BASIC METABOLIC PANEL - Abnormal; Notable for the following:       Result Value   CO2 19 (*)    Glucose, Bld 100 (*)    BUN 90 (*)    Creatinine, Ser 4.00 (*)    Calcium 8.1 (*)    GFR calc non Af Amer 13 (*)    GFR calc Af Amer 15 (*)    All other components within normal limits  CBC - Abnormal; Notable for the following:    RBC 3.42 (*)    Hemoglobin 8.4 (*)    HCT 25.9 (*)    MCV 75.7 (*)    MCH 24.6 (*)    All other components within normal limits  TROPONIN I  I-STAT TROPOININ, ED    EKG  EKG Interpretation  Date/Time:  Wednesday November 20 2015 22:22:55 EST Ventricular Rate:  72 PR Interval:  154 QRS Duration: 86 QT Interval:  400 QTC Calculation: 438 R Axis:   64 Text Interpretation:  Normal sinus rhythm Cannot rule out Anterior infarct , age undetermined Abnormal ECG No significant change was found Confirmed by Wyvonnia Dusky  MD, Adeyemi Hamad [phone]) on 11/21/2015 3:18:52 AM       Radiology Dg Chest 2 View  Result Date: 11/20/2015 CLINICAL DATA:  Initial evaluation for acute midsternal and right-sided chest pain, palpitations, shortness of breath. EXAM: CHEST  2 VIEW COMPARISON:  Prior radiograph from 10/19/2015. FINDINGS: The cardiac and mediastinal silhouettes are stable in size and contour, and remain within normal limits. The lungs are normally  inflated. No airspace consolidation,  pleural effusion, or pulmonary edema is identified. There is no pneumothorax. No acute osseous abnormality identified. IMPRESSION: No active cardiopulmonary disease. Electronically Signed   By: Jeannine Boga M.D.   On: 11/20/2015 22:49    Procedures Procedures (including critical care time)  Medications Ordered in ED Medications - No data to display   Initial Impression / Assessment and Plan / ED Course  I have reviewed the triage vital signs and the nursing notes.  Pertinent labs & imaging results that were available during my care of the patient were reviewed by me and considered in my medical decision making (see chart for details).  Clinical Course   Patient with episode of palpitations throughout the day. Had a brief feeling of tightness in her chest earlier which is since resolved. No shortness of breath any worse than baseline.  EKG unchanged, Lungs clear. Troponin negative.  No hypoxia or tachycardia. Doubt PE. CXR negative.   Creatinine is 4 which is improved from previous values for [facility] records. Patient is being prepared for dialysis. Potassium is normal. Pt's creatinine at [facility] on 11/11/15 was 4.36.  Patient insisting on leaving before second troponin has resulted. States she has to pick up her child. She understands she could be at risk for having damage to her heart, arrhythmia, heart attack which could be life-threatening. She appears to have capacity to make medical decisions. AGAINST MEDICAL ADVICE.  Patient requesting to leave against medical advice. He is alert and oriented x3 and appears to have decision making capacity. Denies suicidal or homicidal ideation.  Discussed that admission and further testing is recommended and emergent medical conditions have not been ruled out. Discussed that leaving against medical advice may result in clinical deterioration and possible death.   Final Clinical Impressions(s) / ED Diagnoses    Final diagnoses:  Palpitations  Chronic kidney disease, unspecified CKD stage    New Prescriptions New Prescriptions   No medications on file   I personally performed the services described in this documentation, which was scribed in my presence. The recorded information has been reviewed and is accurate.     Ezequiel Essex, MD 11/21/15 [phone]

## 2015-11-21 NOTE — ED Notes (Addendum)
Pt expressed desire to leave, RN informed MD who stated that we only needed trop to result.  Pt expressed that she could not wait the 10 minutes needed to result (RN contacted lab to determine the amount of time)  Pt verified phone number incase she would needed to be contacted to discuss lab.  [phone]

## 2015-11-29 ENCOUNTER — Emergency Department (HOSPITAL_COMMUNITY): Payer: Medicare Other

## 2015-11-29 ENCOUNTER — Encounter (HOSPITAL_COMMUNITY): Payer: Self-pay | Admitting: Emergency Medicine

## 2015-11-29 DIAGNOSIS — R079 Chest pain, unspecified: Secondary | ICD-10-CM | POA: Diagnosis present

## 2015-11-29 DIAGNOSIS — I129 Hypertensive chronic kidney disease with stage 1 through stage 4 chronic kidney disease, or unspecified chronic kidney disease: Secondary | ICD-10-CM | POA: Diagnosis not present

## 2015-11-29 DIAGNOSIS — E1122 Type 2 diabetes mellitus with diabetic chronic kidney disease: Secondary | ICD-10-CM | POA: Diagnosis not present

## 2015-11-29 DIAGNOSIS — N183 Chronic kidney disease, stage 3 (moderate): Secondary | ICD-10-CM | POA: Diagnosis not present

## 2015-11-29 DIAGNOSIS — Z7982 Long term (current) use of aspirin: Secondary | ICD-10-CM | POA: Diagnosis not present

## 2015-11-29 LAB — I-STAT TROPONIN, ED: TROPONIN I, POC: 0 ng/mL (ref 0.00–0.08)

## 2015-11-29 LAB — BASIC METABOLIC PANEL
ANION GAP: 10 (ref 5–15)
BUN: 56 mg/dL — ABNORMAL HIGH (ref 6–20)
CALCIUM: 7.2 mg/dL — AB (ref 8.9–10.3)
CHLORIDE: 109 mmol/L (ref 101–111)
CO2: 21 mmol/L — AB (ref 22–32)
CREATININE: 3.26 mg/dL — AB (ref 0.44–1.00)
GFR calc non Af Amer: 16 mL/min — ABNORMAL LOW (ref >60)
GFR, EST AFRICAN AMERICAN: 19 mL/min — AB (ref >60)
Glucose, Bld: 107 mg/dL — ABNORMAL HIGH (ref 65–99)
Potassium: 4.1 mmol/L (ref 3.5–5.1)
SODIUM: 140 mmol/L (ref 135–145)

## 2015-11-29 LAB — CBC
HCT: 24.3 % — ABNORMAL LOW (ref 36.0–46.0)
HEMOGLOBIN: 7.8 g/dL — AB (ref 12.0–15.0)
MCH: 25.1 pg — AB (ref 26.0–34.0)
MCHC: 32.1 g/dL (ref 30.0–36.0)
MCV: 78.1 fL (ref 78.0–100.0)
PLATELETS: 356 10*3/uL (ref 150–400)
RBC: 3.11 MIL/uL — AB (ref 3.87–5.11)
RDW: 15.8 % — ABNORMAL HIGH (ref 11.5–15.5)
WBC: 7 10*3/uL (ref 4.0–10.5)

## 2015-11-29 NOTE — ED Triage Notes (Signed)
C/o heaviness in center of chest, sob, and non-productive cough since 4:30pm.  Denies nausea and vomiting.

## 2015-11-30 ENCOUNTER — Emergency Department (HOSPITAL_COMMUNITY): Payer: Medicare Other

## 2015-11-30 ENCOUNTER — Emergency Department (HOSPITAL_COMMUNITY)
Admission: EM | Admit: 2015-11-30 | Discharge: 2015-11-30 | Disposition: A | Discharge status: Home / Self Care | Payer: Medicare Other | Attending: Emergency Medicine | Admitting: Emergency Medicine

## 2015-11-30 DIAGNOSIS — R079 Chest pain, unspecified: Secondary | ICD-10-CM

## 2015-11-30 LAB — D-DIMER, QUANTITATIVE: D-Dimer, Quant: 1.79 ug/mL-FEU — ABNORMAL HIGH (ref 0.00–0.50)

## 2015-11-30 LAB — TROPONIN I: Troponin I: <0.03 ng/mL (ref <0.03)

## 2015-11-30 MED ORDER — TECHNETIUM TO 99M ALBUMIN AGGREGATED
4.0000 | Freq: Once | INTRAVENOUS | Status: AC | PRN
  Administered 2015-11-30: 4 via INTRAVENOUS

## 2015-11-30 MED ORDER — ONDANSETRON HCL 4 MG/2ML IJ SOLN
4.0000 mg | Freq: Once | INTRAMUSCULAR | Status: AC
  Administered 2015-11-30: 4 mg via INTRAVENOUS
  Filled 2015-11-30: qty 2

## 2015-11-30 MED ORDER — MORPHINE SULFATE (PF) 4 MG/ML IV SOLN
4.0000 mg | Freq: Once | INTRAVENOUS | Status: AC
Start: 2015-11-30 — End: 2015-11-30
  Administered 2015-11-30: 4 mg via INTRAVENOUS
  Filled 2015-11-30: qty 1

## 2015-11-30 MED ORDER — TECHNETIUM TC 99M DIETHYLENETRIAME-PENTAACETIC ACID: 30.0000 | Freq: Once | INTRAVENOUS | Status: DC | PRN

## 2015-11-30 NOTE — Discharge Instructions (Signed)
Continue your regular home medications. Follow-up with your cardiologist. Also recommend to follow-up with your primary care doctor. Return to the ED for new or worsening symptoms.

## 2015-11-30 NOTE — ED Notes (Signed)
IV nurse inserted peripheral IV access .

## 2015-11-30 NOTE — ED Provider Notes (Signed)
[facility]-EMERGENCY DEPT Provider Note   CSN: [id number] Arrival date & time: 11/29/15  1932  History   Chief Complaint Chief Complaint  Patient presents with  . Chest Pain  . Shortness of Breath  . Cough    HPI Patricia [name] is a 40 y.o. female.  HPI   Patient presents the emergency department for evaluation of chest pain or shortness of breath. She has past medical history of chronic kidney disease, just received her fistula which has not yet been used. She has diabetes and history of chest pain with multiple visits for chest pain. She takes erythropoietin for chronic anemia. She describes having a heaviness in the center of her chest with shortness of breath and nonproductive cough since 4:30. She's not had any diaphoresis, nausea, vomiting, back pain, abdominal pain. She tried taking an Ativan at home for the pain but it did not alleviate any of her symptoms. She then tried a gi cocktail and that did not help either. She does not feel like this is gas or muscular. She states she became very scared and her kids brought her to the ED. She currently continues to feel pressure to her chest.   Past Medical History:  Diagnosis Date  . Acid reflux   . Chronic kidney disease (CKD), stage III (moderate)   . Headache(784.0)    "related to chemo; sometimes weekly" (09/12/2013)  . High cholesterol   . History of blood transfusion "a couple"   "related to low counts"  . Hypertension   . Iron deficiency anemia    "get epogen shots q month" (02/20/2014)  . 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 Type II diabetes mellitus ([facility])    "took me off my RX ~ 04/2013"    Patient Active Problem List   Diagnosis Date Noted  . Hypomagnesemia 04/28/2015  . Acute renal failure superimposed on stage 3 chronic kidney disease ([facility]) 04/12/2014  . Atypical chest pain   . Chest pain 02/19/2014  . Hypokalemia 02/19/2014  . Nephrotic syndrome  10/02/2013  . Dyslipidemia 10/02/2013  . PAT (paroxysmal atrial tachycardia) ([facility]) 09/29/2013  . CKD stage 3 followed at [facility] 09/12/2013  . Symptomatic anemia 06/29/2013  . Edema 06/29/2013  . Fever, unspecified 01/31/2013  . Glomerulonephritis 01/28/2013  . Acute renal failure ([facility]) 01/28/2013  . Syncope 01/28/2013  . Chest pain with moderate risk of acute coronary syndrome 07/04/2011  . Shortness of breath 07/04/2011  . Morbid obesity ([facility]) 07/04/2011  . HTN (hypertension) 07/04/2011  . DM (diabetes mellitus) ([facility]) 07/04/2011  . Hypercholesterolemia 07/04/2011    Past Surgical History:  Procedure Laterality Date  . ABDOMINAL HYSTERECTOMY  2010   "laparoscopic"  . ANKLE FRACTURE SURGERY Right 1994  . CARDIAC CATHETERIZATION  2000's  . FRACTURE SURGERY      OB History    Gravida Para Term Preterm AB Living   3             SAB TAB Ectopic Multiple Live Births         3         Home Medications    Prior to Admission medications   Medication Sig Start Date End Date Taking? Authorizing Provider  albuterol (PROVENTIL HFA;VENTOLIN HFA) 108 (90 Base) MCG/ACT inhaler Inhale 2 puffs into the lungs every 4 (four) hours as needed. 11/02/15   Historical Provider, MD  amLODipine ([place]ASC) 5 MG tablet Take 5 mg by  mouth daily. 08/12/14   Historical Provider, MD  aspirin 81 MG chewable tablet Chew 1 tablet (81 mg total) by mouth daily. 10/03/13   Eugenie Filler, MD  atorvastatin (LIPITOR) 40 MG tablet Take 40 mg by mouth daily. 03/14/14   Historical Provider, MD  colchicine 0.6 MG tablet Take 0.6 mg by mouth every other day. 10/30/14 11/21/15  Historical Provider, MD  diphenhydramine-acetaminophen (TYLENOL PM) 25-500 MG TABS tablet Take 2 tablets by mouth at bedtime as needed (for sleep).     Historical Provider, MD  febuxostat (ULORIC) 40 MG tablet Take 40 mg by mouth daily. 08/08/15   Historical Provider, MD  gabapentin (NEURONTIN) 300 MG capsule Take 300 mg by mouth daily.  06/12/15  06/11/16  Historical Provider, MD  lisinopril (PRINIVIL,ZESTRIL) 10 MG tablet Take 10 mg by mouth daily.    Historical Provider, MD  LORazepam (ATIVAN) 0.5 MG tablet Take 0.5 mg by mouth every 8 (eight) hours as needed for anxiety.  07/11/15   Historical Provider, MD  magnesium oxide (MAG-OX) 400 MG tablet Take 1 tablet (400 mg total) by mouth daily. 04/29/15   Venetia Maxon Rama, MD  metolazone (ZAROXOLYN) 2.5 MG tablet Take 1 tablet (2.5 mg total) by mouth daily as needed (for leg swellings or shortness of breath). Patient not taking: Reported on 11/21/2015 04/12/14   Nishant Dhungel, MD  metolazone (ZAROXOLYN) 5 MG tablet Take 5 mg by mouth daily. 10/07/15   Historical Provider, MD  metoprolol tartrate (LOPRESSOR) 25 MG tablet Take 1 tablet (25 mg total) by mouth 2 (two) times daily. 10/03/13   Eugenie Filler, MD  Multiple Vitamin (MULTIVITAMIN WITH MINERALS) TABS tablet Take 1 tablet by mouth daily.    Historical Provider, MD  omeprazole (PRILOSEC) 40 MG capsule Take 40 mg by mouth daily.      Historical Provider, MD  oxyCODONE-acetaminophen (PERCOCET/ROXICET) 5-325 MG tablet Take 1-2 tablets by mouth every 6 (six) hours as needed. 09/05/15   Kristen N Ward, DO  potassium chloride (K-DUR) 10 MEQ tablet Take 10 mEq by mouth daily. 08/12/14   Historical Provider, MD  promethazine (PHENERGAN) 25 MG tablet Take 1 tablet (25 mg total) by mouth every 6 (six) hours as needed for nausea or vomiting. 01/23/15   Margarita Mail, PA-C  sertraline (ZOLOFT) 25 MG tablet Take 25 mg by mouth daily.    Historical Provider, MD  temazepam (RESTORIL) 15 MG capsule Take 15 mg by mouth at bedtime as needed for sleep.    Historical Provider, MD  torsemide (DEMADEX) 20 MG tablet Take 2 tablets (40 mg total) by mouth daily. Patient taking differently: Take 80 mg by mouth daily.  04/12/14   Nishant Dhungel, MD  traZODone (DESYREL) 50 MG tablet Take 50 mg by mouth at bedtime as needed for sleep.     Historical Provider, MD  Vitamin D,  Ergocalciferol, (DRISDOL) 50000 units CAPS capsule Take 50,000 Units by mouth once a week. 09/11/15 09/10/16  Historical Provider, MD    Family History Family History  Problem Relation Age of Onset  . Hypertension Mother   . Thyroid disease Mother   . Coronary artery disease Father   . Hypertension Father   . Diabetes Father     Social History Social History  Substance Use Topics  . Smoking status: Never Smoker  . Smokeless tobacco: Never Used  . Alcohol use No     Allergies   Nsaids and Tolmetin   Review of Systems Review of Systems  Review  of Systems All other systems negative except as documented in the HPI. All pertinent positives and negatives as reviewed in the HPI.  Physical Exam Updated Vital Signs BP 110/79   Pulse 69   Temp 98.3 F (36.8 C) (Oral)   Resp 22   Ht $R'5\' 7"'Gq$  (1.702 m)   Wt 112 kg   SpO2 98%   BMI 38.69 kg/m   Physical Exam  Constitutional: She appears well-developed and well-nourished.  HENT:  Head: Normocephalic and atraumatic.  Eyes: Conjunctivae are normal. Pupils are equal, round, and reactive to light.  Neck: Trachea normal, normal range of motion and full passive range of motion without pain. Neck supple.  Cardiovascular: Normal rate, regular rhythm and normal pulses.   Pulmonary/Chest: Effort normal and breath sounds normal. Chest wall is not dull to percussion. She exhibits no tenderness, no crepitus, no edema, no deformity and no retraction.  Abdominal: Soft. Normal appearance and bowel sounds are normal.  Musculoskeletal: Normal range of motion.  Positive thrill to left wrist fistula.  Neurological: She is alert. She has normal strength.  Skin: Skin is warm, dry and intact.  Psychiatric: She has a normal mood and affect. Her speech is normal and behavior is normal. Judgment and thought content normal. Cognition and memory are normal.     ED Treatments / Results  Labs (all labs ordered are listed, but only abnormal results are  displayed) Labs Reviewed  BASIC METABOLIC PANEL - Abnormal; Notable for the following:       Result Value   CO2 21 (*)    Glucose, Bld 107 (*)    BUN 56 (*)    Creatinine, Ser 3.26 (*)    Calcium 7.2 (*)    GFR calc non Af Amer 16 (*)    GFR calc Af Amer 19 (*)    All other components within normal limits  CBC - Abnormal; Notable for the following:    RBC 3.11 (*)    Hemoglobin 7.8 (*)    HCT 24.3 (*)    MCH 25.1 (*)    RDW 15.8 (*)    All other components within normal limits  D-DIMER, QUANTITATIVE (NOT AT [facility]) - Abnormal; Notable for the following:    D-Dimer, Quant 1.79 (*)    All other components within normal limits  TROPONIN I  I-STAT TROPOININ, ED    EKG  EKG Interpretation  Date/Time:  Friday November 29 2015 19:39:49 EST Ventricular Rate:  75 PR Interval:  152 QRS Duration: 86 QT Interval:  426 QTC Calculation: 475 R Axis:   56 Text Interpretation:  Normal sinus rhythm Normal ECG No significant change since last tracing Confirmed by Christy Gentles  MD, [place] ([id number]) on 11/30/2015 1:00:29 AM       Radiology Dg Chest 2 View  Result Date: 11/29/2015 CLINICAL DATA:  Chest pain, shortness of breath. EXAM: CHEST  2 VIEW COMPARISON:  Radiographs of November 20, 2015. FINDINGS: The heart size and mediastinal contours are within normal limits. Both lungs are clear. No pneumothorax or pleural effusion is noted. The visualized skeletal structures are unremarkable. IMPRESSION: No active cardiopulmonary disease. Electronically Signed   By: Marijo Conception, M.D.   On: 11/29/2015 20:01    Procedures Procedures (including critical care time)  Medications Ordered in ED Medications  morphine 4 MG/ML injection 4 mg (4 mg Intravenous Given 11/30/15 0236)  ondansetron (ZOFRAN) injection 4 mg (4 mg Intravenous Given 11/30/15 0236)   Initial Impression / Assessment and Plan /  ED Course  I have reviewed the triage vital signs and the nursing notes.  Pertinent labs & imaging  results that were available during my care of the patient were reviewed by me and considered in my medical decision making (see chart for details).  Clinical Course    Patient has had normal chest xray, EKG and two negative Troponin's. Her EKG is not acute. The patient has been here many times for CP and SOB and her d-dimer is typically elevated but it is higher than normal today. Will order VQ scan to r/o PE. Discussed this with patient who is willing to stay. She understands that if this test is negative that she will be going home to follow-up with her PCP and cardiology. At end of shift patient sign out to Quincy Carnes, PA-C, VQ scan to be done with staff arrives in the AM.  Final Clinical Impressions(s) / ED Diagnoses   Final diagnoses:  None    New Prescriptions New Prescriptions   No medications on file     Delos Haring, PA-C 11/30/15 [place], MD 11/30/15 [phone]

## 2015-11-30 NOTE — ED Notes (Signed)
Patient made aware VQ scan called and will be coming to get patient. Pt is resting, reports continued chest "heaviness". Given warm wash cloth for her face.

## 2015-11-30 NOTE — ED Provider Notes (Signed)
Assumed care from PA Greene at shift change.  See her note for full H&P.  Briefly 40 y.o. F here with chest pain.  Has been seen here before for same.  Lab work consistent with her CKD.  She has had 2 negative troponins but does have positive d-dimer.  EKG non-ischemic.    Plan:  VQ scan as CTA not an option with her renal function.  If negative, ok to discharge home with cardiology follow-up.  Results for orders placed or performed during the hospital encounter of 31/51/76  Basic metabolic panel  Result Value Ref Range   Sodium 140 135 - 145 mmol/L   Potassium 4.1 3.5 - 5.1 mmol/L   Chloride 109 101 - 111 mmol/L   CO2 21 (L) 22 - 32 mmol/L   Glucose, Bld 107 (H) 65 - 99 mg/dL   BUN 56 (H) 6 - 20 mg/dL   Creatinine, Ser 3.26 (H) 0.44 - 1.00 mg/dL   Calcium 7.2 (L) 8.9 - 10.3 mg/dL   GFR calc non Af Amer 16 (L) >60 mL/min   GFR calc Af Amer 19 (L) >60 mL/min   Anion gap 10 5 - 15  CBC  Result Value Ref Range   WBC 7.0 4.0 - 10.5 K/uL   RBC 3.11 (L) 3.87 - 5.11 MIL/uL   Hemoglobin 7.8 (L) 12.0 - 15.0 g/dL   HCT 24.3 (L) 36.0 - 46.0 %   MCV 78.1 78.0 - 100.0 fL   MCH 25.1 (L) 26.0 - 34.0 pg   MCHC 32.1 30.0 - 36.0 g/dL   RDW 15.8 (H) 11.5 - 15.5 %   Platelets 356 150 - 400 K/uL  Troponin I  Result Value Ref Range   Troponin I <0.03 <0.03 ng/mL  D-dimer, quantitative (not at [facility])  Result Value Ref Range   D-Dimer, Quant 1.79 (H) 0.00 - 0.50 ug/mL-FEU  I-stat troponin, ED  Result Value Ref Range   Troponin i, poc 0.00 0.00 - 0.08 ng/mL   Comment 3           Dg Chest 2 View  Result Date: 11/29/2015 CLINICAL DATA:  Chest pain, shortness of breath. EXAM: CHEST  2 VIEW COMPARISON:  Radiographs of November 20, 2015. FINDINGS: The heart size and mediastinal contours are within normal limits. Both lungs are clear. No pneumothorax or pleural effusion is noted. The visualized skeletal structures are unremarkable. IMPRESSION: No active cardiopulmonary disease. Electronically Signed   By:  Marijo Conception, M.D.   On: 11/29/2015 20:01   Dg Chest 2 View  Result Date: 11/20/2015 CLINICAL DATA:  Initial evaluation for acute midsternal and right-sided chest pain, palpitations, shortness of breath. EXAM: CHEST  2 VIEW COMPARISON:  Prior radiograph from 10/19/2015. FINDINGS: The cardiac and mediastinal silhouettes are stable in size and contour, and remain within normal limits. The lungs are normally inflated. No airspace consolidation, pleural effusion, or pulmonary edema is identified. There is no pneumothorax. No acute osseous abnormality identified. IMPRESSION: No active cardiopulmonary disease. Electronically Signed   By: Jeannine Boga M.D.   On: 11/20/2015 22:49   Nm Pulmonary Perf And Vent  Result Date: 11/30/2015 CLINICAL DATA:  Shortness of Breath EXAM: NUCLEAR MEDICINE VENTILATION - PERFUSION LUNG SCAN TECHNIQUE: Ventilation images were obtained in multiple projections using inhaled aerosol Tc-58m DTPA. Perfusion images were obtained in multiple projections after intravenous injection of Tc-33m MAA. RADIOPHARMACEUTICALS:  Thirty-one mCi Technetium-64m DTPA aerosol inhalation and 4.1 mCi Technetium-67m MAA IV COMPARISON:  11/29/2015 FINDINGS: Ventilation: No  focal ventilation defect. Perfusion: No wedge shaped peripheral perfusion defects to suggest acute pulmonary embolism. IMPRESSION: No evidence of pulmonary embolism. Electronically Signed   By: Inez Catalina M.D.   On: 11/30/2015 09:14    9:20 AM Patient's VQ scan is negative for PE.  Patient's vitals remained stable.  She is followed by cardiology, Dr. Sallyanne Kuster. I have recommended that she schedule appt for follow-up as soon as possible.  Discussed plan with patient, she acknowledged understanding and agreed with plan of care.  Return precautions given for new or worsening symptoms.   Larene Pickett, PA-C 11/30/15 [id number]    Dorie Rank, MD 11/30/15 [phone]

## 2015-11-30 NOTE — ED Notes (Signed)
Pt transported to nuclear med for scan.

## 2015-11-30 NOTE — ED Notes (Signed)
Family at bedside. 

## 2016-01-01 ENCOUNTER — Encounter (HOSPITAL_COMMUNITY): Payer: Self-pay | Admitting: Emergency Medicine

## 2016-01-01 ENCOUNTER — Emergency Department (HOSPITAL_COMMUNITY): Payer: Medicare Other

## 2016-01-01 DIAGNOSIS — N183 Chronic kidney disease, stage 3 (moderate): Secondary | ICD-10-CM | POA: Diagnosis not present

## 2016-01-01 DIAGNOSIS — I129 Hypertensive chronic kidney disease with stage 1 through stage 4 chronic kidney disease, or unspecified chronic kidney disease: Secondary | ICD-10-CM | POA: Insufficient documentation

## 2016-01-01 DIAGNOSIS — E1122 Type 2 diabetes mellitus with diabetic chronic kidney disease: Secondary | ICD-10-CM | POA: Insufficient documentation

## 2016-01-01 DIAGNOSIS — Z7982 Long term (current) use of aspirin: Secondary | ICD-10-CM | POA: Insufficient documentation

## 2016-01-01 DIAGNOSIS — R0789 Other chest pain: Secondary | ICD-10-CM | POA: Diagnosis present

## 2016-01-01 DIAGNOSIS — Z5321 Procedure and treatment not carried out due to patient leaving prior to being seen by health care provider: Secondary | ICD-10-CM | POA: Diagnosis not present

## 2016-01-01 LAB — CBC
HCT: 30.5 % — ABNORMAL LOW (ref 36.0–46.0)
Hemoglobin: 9.3 g/dL — ABNORMAL LOW (ref 12.0–15.0)
MCH: 24.3 pg — ABNORMAL LOW (ref 26.0–34.0)
MCHC: 30.5 g/dL (ref 30.0–36.0)
MCV: 79.8 fL (ref 78.0–100.0)
PLATELETS: 411 10*3/uL — AB (ref 150–400)
RBC: 3.82 MIL/uL — ABNORMAL LOW (ref 3.87–5.11)
RDW: 18.1 % — AB (ref 11.5–15.5)
WBC: 5.8 10*3/uL (ref 4.0–10.5)

## 2016-01-01 LAB — BASIC METABOLIC PANEL
Anion gap: 11 (ref 5–15)
BUN: 51 mg/dL — AB (ref 6–20)
CHLORIDE: 110 mmol/L (ref 101–111)
CO2: 19 mmol/L — ABNORMAL LOW (ref 22–32)
CREATININE: 3.23 mg/dL — AB (ref 0.44–1.00)
Calcium: 7 mg/dL — ABNORMAL LOW (ref 8.9–10.3)
GFR calc Af Amer: 19 mL/min — ABNORMAL LOW (ref >60)
GFR calc non Af Amer: 17 mL/min — ABNORMAL LOW (ref >60)
GLUCOSE: 124 mg/dL — AB (ref 65–99)
Potassium: 4.2 mmol/L (ref 3.5–5.1)
SODIUM: 140 mmol/L (ref 135–145)

## 2016-01-01 LAB — I-STAT TROPONIN, ED: Troponin i, poc: 0 ng/mL (ref 0.00–0.08)

## 2016-01-01 NOTE — ED Triage Notes (Signed)
Patient reports left upper chest pain " pressure" radiating to left upper back with nausea and emesis onset today .

## 2016-01-02 ENCOUNTER — Emergency Department (HOSPITAL_COMMUNITY)
Admission: EM | Admit: 2016-01-02 | Discharge: 2016-01-02 | Disposition: A | Discharge status: Home / Self Care | Payer: Medicare Other | Attending: Emergency Medicine | Admitting: Emergency Medicine

## 2016-01-02 NOTE — ED Notes (Signed)
Pt states that she is leaving because she is tired. Unable to talk pt into staying, pt informed that there was only three in front of her.

## 2016-01-11 ENCOUNTER — Encounter (HOSPITAL_COMMUNITY): Payer: Self-pay

## 2016-01-11 ENCOUNTER — Emergency Department (HOSPITAL_COMMUNITY)
Admission: EM | Admit: 2016-01-11 | Discharge: 2016-01-12 | Disposition: A | Discharge status: Home / Self Care | Payer: Medicare Other | Attending: Emergency Medicine | Admitting: Emergency Medicine

## 2016-01-11 DIAGNOSIS — R079 Chest pain, unspecified: Secondary | ICD-10-CM | POA: Diagnosis present

## 2016-01-11 DIAGNOSIS — R0789 Other chest pain: Secondary | ICD-10-CM | POA: Insufficient documentation

## 2016-01-11 DIAGNOSIS — N183 Chronic kidney disease, stage 3 (moderate): Secondary | ICD-10-CM | POA: Diagnosis not present

## 2016-01-11 DIAGNOSIS — I129 Hypertensive chronic kidney disease with stage 1 through stage 4 chronic kidney disease, or unspecified chronic kidney disease: Secondary | ICD-10-CM | POA: Diagnosis not present

## 2016-01-11 DIAGNOSIS — Z79899 Other long term (current) drug therapy: Secondary | ICD-10-CM | POA: Diagnosis not present

## 2016-01-11 DIAGNOSIS — E1122 Type 2 diabetes mellitus with diabetic chronic kidney disease: Secondary | ICD-10-CM | POA: Diagnosis not present

## 2016-01-11 DIAGNOSIS — Z7982 Long term (current) use of aspirin: Secondary | ICD-10-CM | POA: Insufficient documentation

## 2016-01-11 MED ORDER — ONDANSETRON HCL 4 MG/2ML IJ SOLN
4.0000 mg | Freq: Once | INTRAMUSCULAR | Status: AC
  Administered 2016-01-12: 4 mg via INTRAVENOUS
  Filled 2016-01-11: qty 2

## 2016-01-11 MED ORDER — GI COCKTAIL ~~LOC~~
30.0000 mL | Freq: Once | ORAL | Status: AC
  Administered 2016-01-12: 30 mL via ORAL
  Filled 2016-01-11: qty 30

## 2016-01-11 NOTE — ED Triage Notes (Signed)
Pt presents to the ed with complaints of pressure in her chest that is radiating to her left arm.  She has a fistula that was recently placed in her left arm and is getting prepped for dialysis.  She started having chest pain 2 hours ago with a short episode of shortness of breath and nausea which has subsided.

## 2016-01-11 NOTE — ED Provider Notes (Signed)
[facility] DEPT Provider Note   CSN: [id number] Arrival date & time: 01/11/16  2247  By signing my name below, I, Patricia [name], attest that this documentation has been prepared under the direction and in the presence of Orpah Greek, MD.  Electronically Signed: Julien Nordmann, ED Scribe. 01/11/16. 11:20 PM.    History   Chief Complaint Chief Complaint  Patient presents with  . Chest Pain    The history is provided by the patient. No language interpreter was used.   HPI Comments: Patricia [name] is a 41 y.o. female who has a PMhx of GERD, CKD, HLD, HTN, and DMII presents to the Emergency Department complaining of sudden onset, moderate central chest pain that radiates to her left arm x 2 hours ago. She describes the pain as a "pressure-like, heaviness" sensation. She notes associated mild shortness of breath and one episode of vomiting. Pt additionally states that she has mild nausea at baseline due to her CKD medication; however, she reports that it has not acutely changed recently. Pt reports feeling fatigued and more tired this morning which is unusual. Pt was sitting down watching television ~ 2 hours ago when she began to have acute onset chest pain.  She expresses that she had a fistula placed in her left arm on 10/25/15 and notes having intermittent pain from her sight. She has been evaluated multiple times in the ED for the same presentation of symptoms which has been consistent with the same diagnosis of non-cardiac chest pain. She states not dialyze yet but she says that she is unsure if her left arm pain is due to the fistula. Pt has a family hx of CAD. Pt is a non-smoker.   Past Medical History:  Diagnosis Date  . Acid reflux   . Chronic kidney disease (CKD), stage III (moderate)   . Headache(784.0)    "related to chemo; sometimes weekly" (09/12/2013)  . High cholesterol   . History of blood transfusion "a couple"   "related to low counts"  . Hypertension   .  Iron deficiency anemia    "get epogen shots q month" (02/20/2014)  . 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 Type II diabetes mellitus ([place])    "took me off my RX ~ 04/2013"    Patient Active Problem List   Diagnosis Date Noted  . Hypomagnesemia 04/28/2015  . Acute renal failure superimposed on stage 3 chronic kidney disease ([place]) 04/12/2014  . Atypical chest pain   . Chest pain 02/19/2014  . Hypokalemia 02/19/2014  . Nephrotic syndrome 10/02/2013  . Dyslipidemia 10/02/2013  . PAT (paroxysmal atrial tachycardia) ([place]) 09/29/2013  . CKD stage 3 followed at [facility] 09/12/2013  . Symptomatic anemia 06/29/2013  . Edema 06/29/2013  . Fever, unspecified 01/31/2013  . Glomerulonephritis 01/28/2013  . Acute renal failure ([place]) 01/28/2013  . Syncope 01/28/2013  . Chest pain with moderate risk of acute coronary syndrome 07/04/2011  . Shortness of breath 07/04/2011  . Morbid obesity ([place]) 07/04/2011  . HTN (hypertension) 07/04/2011  . DM (diabetes mellitus) ([place]) 07/04/2011  . Hypercholesterolemia 07/04/2011    Past Surgical History:  Procedure Laterality Date  . ABDOMINAL HYSTERECTOMY  2010   "laparoscopic"  . ANKLE FRACTURE SURGERY Right 1994  . CARDIAC CATHETERIZATION  2000's  . FRACTURE SURGERY      OB History    Gravida Para Term Preterm AB Living   3  SAB TAB Ectopic Multiple Live Births         3         Home Medications    Prior to Admission medications   Medication Sig Start Date End Date Taking? Authorizing Provider  albuterol (PROVENTIL HFA;VENTOLIN HFA) 108 (90 Base) MCG/ACT inhaler Inhale 2 puffs into the lungs every 4 (four) hours as needed for wheezing or shortness of breath.  11/02/15  Yes Historical Provider, MD  amLODipine (NORVASC) 5 MG tablet Take 5 mg by mouth daily. 08/12/14  Yes Historical Provider, MD  aspirin 81 MG chewable tablet Chew 1 tablet (81 mg total) by mouth daily. 10/03/13   Yes Eugenie Filler, MD  atorvastatin (LIPITOR) 40 MG tablet Take 40 mg by mouth daily. 03/14/14  Yes Historical Provider, MD  colchicine 0.6 MG tablet Take 0.6 mg by mouth every other day. 10/30/14 01/11/16 Yes Historical Provider, MD  diphenhydramine-acetaminophen (TYLENOL PM) 25-500 MG TABS tablet Take 2 tablets by mouth at bedtime as needed (for sleep).    Yes Historical Provider, MD  Epoetin Alfa (EPOGEN IJ) Inject 1 application as directed every 14 (fourteen) days.   Yes Historical Provider, MD  febuxostat (ULORIC) 40 MG tablet Take 40 mg by mouth daily. 08/08/15  Yes Historical Provider, MD  gabapentin (NEURONTIN) 300 MG capsule Take 300 mg by mouth daily.  06/12/15 06/11/16 Yes Historical Provider, MD  lisinopril (PRINIVIL,ZESTRIL) 10 MG tablet Take 10 mg by mouth daily.   Yes Historical Provider, MD  LORazepam (ATIVAN) 0.5 MG tablet Take 0.5 mg by mouth every 8 (eight) hours as needed for anxiety.  07/11/15  Yes Historical Provider, MD  magnesium oxide (MAG-OX) 400 MG tablet Take 1 tablet (400 mg total) by mouth daily. 04/29/15  Yes Christina P Rama, MD  metolazone (ZAROXOLYN) 5 MG tablet Take 5 mg by mouth daily. 10/07/15  Yes Historical Provider, MD  metoprolol tartrate (LOPRESSOR) 25 MG tablet Take 1 tablet (25 mg total) by mouth 2 (two) times daily. 10/03/13  Yes Eugenie Filler, MD  Multiple Vitamin (MULTIVITAMIN WITH MINERALS) TABS tablet Take 1 tablet by mouth daily.   Yes Historical Provider, MD  omeprazole (PRILOSEC) 40 MG capsule Take 40 mg by mouth daily.     Yes Historical Provider, MD  oxyCODONE-acetaminophen (PERCOCET/ROXICET) 5-325 MG tablet Take 1-2 tablets by mouth every 6 (six) hours as needed. Patient taking differently: Take 1-2 tablets by mouth every 6 (six) hours as needed for moderate pain.  09/05/15  Yes Kristen N Ward, DO  potassium chloride (K-DUR) 10 MEQ tablet Take 10 mEq by mouth daily. 08/12/14  Yes Historical Provider, MD  promethazine (PHENERGAN) 25 MG tablet Take 1 tablet  (25 mg total) by mouth every 6 (six) hours as needed for nausea or vomiting. 01/23/15  Yes Margarita Mail, PA-C  sertraline (ZOLOFT) 25 MG tablet Take 25 mg by mouth daily.   Yes Historical Provider, MD  temazepam (RESTORIL) 15 MG capsule Take 15 mg by mouth at bedtime as needed for sleep.   Yes Historical Provider, MD  torsemide (DEMADEX) 20 MG tablet Take 2 tablets (40 mg total) by mouth daily. Patient taking differently: Take 80 mg by mouth daily.  04/12/14  Yes Nishant Dhungel, MD  traZODone (DESYREL) 50 MG tablet Take 50 mg by mouth at bedtime as needed for sleep.    Yes Historical Provider, MD  Vitamin D, Ergocalciferol, (DRISDOL) 50000 units CAPS capsule Take 50,000 Units by mouth once a week. 09/11/15 09/10/16 Yes Historical Provider,  MD  traMADol (ULTRAM) 50 MG tablet Take 1 tablet (50 mg total) by mouth every 6 (six) hours as needed. 01/12/16   Orpah Greek, MD    Family History Family History  Problem Relation Age of Onset  . Hypertension Mother   . Thyroid disease Mother   . Coronary artery disease Father   . Hypertension Father   . Diabetes Father     Social History Social History  Substance Use Topics  . Smoking status: Never Smoker  . Smokeless tobacco: Never Used  . Alcohol use No     Allergies   Nsaids and Tolmetin   Review of Systems Review of Systems  Respiratory: Positive for shortness of breath.   Cardiovascular: Positive for chest pain.  Gastrointestinal: Positive for nausea and vomiting.  All other systems reviewed and are negative.    Physical Exam Updated Vital Signs BP 121/62   Pulse 73   Temp 98.5 F (36.9 C) (Oral)   Resp 22   SpO2 100%   Physical Exam  Constitutional: She is oriented to person, place, and time. She appears well-developed and well-nourished. No distress.  HENT:  Head: Normocephalic and atraumatic.  Right Ear: Hearing normal.  Left Ear: Hearing normal.  Nose: Nose normal.  Mouth/Throat: Oropharynx is clear and moist  and mucous membranes are normal.  Eyes: Conjunctivae and EOM are normal. Pupils are equal, round, and reactive to light.  Neck: Normal range of motion. Neck supple.  Cardiovascular: Regular rhythm, S1 normal and S2 normal.  Exam reveals no gallop and no friction rub.   No murmur heard. Pulmonary/Chest: Effort normal and breath sounds normal. No respiratory distress. She exhibits no tenderness.  Abdominal: Soft. Normal appearance and bowel sounds are normal. There is no hepatosplenomegaly. There is no tenderness. There is no rebound, no guarding, no tenderness at McBurney's point and negative Murphy's sign. No hernia.  Musculoskeletal: Normal range of motion.  Left AV fistula to the lower left forearm  Neurological: She is alert and oriented to person, place, and time. She has normal strength. No cranial nerve deficit or sensory deficit. Coordination normal. GCS eye subscore is 4. GCS verbal subscore is 5. GCS motor subscore is 6.  Skin: Skin is warm, dry and intact. No rash noted. No cyanosis.  Psychiatric: She has a normal mood and affect. Her speech is normal and behavior is normal. Thought content normal.  Nursing note and vitals reviewed.    ED Treatments / Results  DIAGNOSTIC STUDIES: Oxygen Saturation is 100% on RA, normal by my interpretation.  COORDINATION OF CARE:  11:17 PM Discussed treatment plan with pt at bedside and pt agreed to plan.  Labs (all labs ordered are listed, but only abnormal results are displayed) Labs Reviewed  CBC WITH DIFFERENTIAL/PLATELET - Abnormal; Notable for the following:       Result Value   Hemoglobin 9.8 (*)    HCT 30.9 (*)    MCH 24.9 (*)    RDW 16.5 (*)    All other components within normal limits  COMPREHENSIVE METABOLIC PANEL - Abnormal; Notable for the following:    CO2 19 (*)    BUN 42 (*)    Creatinine, Ser 3.44 (*)    Calcium 7.1 (*)    Total Protein 5.8 (*)    Albumin 2.8 (*)    ALT 11 (*)    Total Bilirubin 0.2 (*)    GFR calc  non Af Amer 15 (*)    GFR calc Af  Amer 18 (*)    All other components within normal limits  URINALYSIS, ROUTINE W REFLEX MICROSCOPIC - Abnormal; Notable for the following:    Glucose, UA 150 (*)    Protein, ur >=300 (*)    Bacteria, UA MANY (*)    Squamous Epithelial / LPF 0-5 (*)    All other components within normal limits  LIPASE, BLOOD  I-STAT TROPOININ, ED  I-STAT TROPOININ, ED    EKG  EKG Interpretation  Date/Time:  Saturday January 11 2016 22:56:48 EST Ventricular Rate:  68 PR Interval:  150 QRS Duration: 84 QT Interval:  442 QTC Calculation: 469 R Axis:   51 Text Interpretation:  Normal sinus rhythm Cannot rule out Anterior infarct , age undetermined Abnormal ECG Confirmed by Jeannetta Cerutti  MD, Nastacia Raybuck [id number]) on 01/12/2016 12:01:11 AM       Radiology Dg Chest 2 View  Result Date: 01/12/2016 CLINICAL DATA:  Pressure in the chest radiating to the left arm EXAM: CHEST  2 VIEW COMPARISON:  01/01/2016 FINDINGS: Low lung volumes. No acute infiltrate or effusion. Stable cardiomediastinal silhouette. No pneumothorax. IMPRESSION: No radiographic evidence for acute cardiopulmonary abnormality Electronically Signed   By: Donavan Foil M.D.   On: 01/12/2016 00:32    Procedures Procedures (including critical care time)  Medications Ordered in ED Medications  ondansetron (ZOFRAN) injection 4 mg (4 mg Intravenous Given 01/12/16 0024)  gi cocktail (Maalox,Lidocaine,Donnatal) (30 mLs Oral Given 01/12/16 0024)  oxyCODONE-acetaminophen (PERCOCET/ROXICET) 5-325 MG per tablet 1 tablet (1 tablet Oral Given 01/12/16 0249)     Initial Impression / Assessment and Plan / ED Course  I have reviewed the triage vital signs and the nursing notes.  Pertinent labs & imaging results that were available during my care of the patient were reviewed by me and considered in my medical decision making (see chart for details).  Clinical Course     Patient presents for evaluation of chest pain. Reviewing  her records reveals that she has had frequent visits to the emergency department with complaints of chest pain. She did have a recent hospitalization during which time she had cardiac rule out and nuclear medicine perfusion study that was normal. Since then she has had multiple ER visits for chest pain and HAVE been negative. Once again, today she has had EKG that is unremarkable and 2 normal troponins. Patient reassured, will need to follow-up with her primary doctor.  Final Clinical Impressions(s) / ED Diagnoses   Final diagnoses:  Atypical chest pain   I personally performed the services described in this documentation, which was scribed in my presence. The recorded information has been reviewed and is accurate.  New Prescriptions New Prescriptions   TRAMADOL (ULTRAM) 50 MG TABLET    Take 1 tablet (50 mg total) by mouth every 6 (six) hours as needed.     Orpah Greek, MD 01/12/16 [phone]

## 2016-01-12 ENCOUNTER — Emergency Department (HOSPITAL_COMMUNITY): Payer: Medicare Other

## 2016-01-12 DIAGNOSIS — R0789 Other chest pain: Secondary | ICD-10-CM | POA: Diagnosis not present

## 2016-01-12 LAB — URINALYSIS, ROUTINE W REFLEX MICROSCOPIC
Bilirubin Urine: NEGATIVE
Glucose, UA: 150 mg/dL — AB
HGB URINE DIPSTICK: NEGATIVE
KETONES UR: NEGATIVE mg/dL
LEUKOCYTES UA: NEGATIVE
Nitrite: NEGATIVE
Specific Gravity, Urine: 1.013 (ref 1.005–1.030)
pH: 5 (ref 5.0–8.0)

## 2016-01-12 LAB — CBC WITH DIFFERENTIAL/PLATELET
Basophils Absolute: 0 10*3/uL (ref 0.0–0.1)
Basophils Relative: 1 %
EOS ABS: 0.2 10*3/uL (ref 0.0–0.7)
EOS PCT: 3 %
HCT: 30.9 % — ABNORMAL LOW (ref 36.0–46.0)
Hemoglobin: 9.8 g/dL — ABNORMAL LOW (ref 12.0–15.0)
LYMPHS PCT: 34 %
Lymphs Abs: 2.1 10*3/uL (ref 0.7–4.0)
MCH: 24.9 pg — ABNORMAL LOW (ref 26.0–34.0)
MCHC: 31.7 g/dL (ref 30.0–36.0)
MCV: 78.4 fL (ref 78.0–100.0)
Monocytes Absolute: 0.5 10*3/uL (ref 0.1–1.0)
Monocytes Relative: 9 %
Neutro Abs: 3.3 10*3/uL (ref 1.7–7.7)
Neutrophils Relative %: 53 %
PLATELETS: 299 10*3/uL (ref 150–400)
RBC: 3.94 MIL/uL (ref 3.87–5.11)
RDW: 16.5 % — ABNORMAL HIGH (ref 11.5–15.5)
WBC: 6.1 10*3/uL (ref 4.0–10.5)

## 2016-01-12 LAB — COMPREHENSIVE METABOLIC PANEL
ALBUMIN: 2.8 g/dL — AB (ref 3.5–5.0)
ALT: 11 U/L — AB (ref 14–54)
AST: 19 U/L (ref 15–41)
Alkaline Phosphatase: 41 U/L (ref 38–126)
Anion gap: 9 (ref 5–15)
BUN: 42 mg/dL — AB (ref 6–20)
CHLORIDE: 111 mmol/L (ref 101–111)
CO2: 19 mmol/L — ABNORMAL LOW (ref 22–32)
CREATININE: 3.44 mg/dL — AB (ref 0.44–1.00)
Calcium: 7.1 mg/dL — ABNORMAL LOW (ref 8.9–10.3)
GFR calc Af Amer: 18 mL/min — ABNORMAL LOW (ref >60)
GFR calc non Af Amer: 15 mL/min — ABNORMAL LOW (ref >60)
GLUCOSE: 95 mg/dL (ref 65–99)
POTASSIUM: 4.4 mmol/L (ref 3.5–5.1)
SODIUM: 139 mmol/L (ref 135–145)
Total Bilirubin: 0.2 mg/dL — ABNORMAL LOW (ref 0.3–1.2)
Total Protein: 5.8 g/dL — ABNORMAL LOW (ref 6.5–8.1)

## 2016-01-12 LAB — I-STAT TROPONIN, ED
TROPONIN I, POC: 0 ng/mL (ref 0.00–0.08)
TROPONIN I, POC: 0.01 ng/mL (ref 0.00–0.08)

## 2016-01-12 LAB — LIPASE, BLOOD: Lipase: 23 U/L (ref 11–51)

## 2016-01-12 MED ORDER — OXYCODONE-ACETAMINOPHEN 5-325 MG PO TABS
1.0000 | ORAL_TABLET | Freq: Once | ORAL | Status: AC
  Administered 2016-01-12: 1 via ORAL
  Filled 2016-01-12: qty 1

## 2016-01-12 MED ORDER — TRAMADOL HCL 50 MG PO TABS: 50.0000 mg | ORAL_TABLET | Freq: Four times a day (QID) | ORAL | 0 refills | Status: DC | PRN

## 2016-01-12 NOTE — ED Notes (Addendum)
Patient ambulatory to restroom and back without difficulty. She states that now her heart feels funny every now and then, like it speeds up and then goes back to normal. MD made aware.

## 2016-01-12 NOTE — ED Notes (Signed)
Pt returned from x-ray and placed back on monitor.

## 2016-01-31 ENCOUNTER — Encounter (HOSPITAL_COMMUNITY): Payer: Self-pay | Admitting: Emergency Medicine

## 2016-01-31 ENCOUNTER — Emergency Department (HOSPITAL_COMMUNITY)
Admission: EM | Admit: 2016-01-31 | Discharge: 2016-01-31 | Disposition: A | Discharge status: Home / Self Care | Payer: Medicare Other | Attending: Emergency Medicine | Admitting: Emergency Medicine

## 2016-01-31 DIAGNOSIS — N183 Chronic kidney disease, stage 3 (moderate): Secondary | ICD-10-CM | POA: Insufficient documentation

## 2016-01-31 DIAGNOSIS — R1031 Right lower quadrant pain: Secondary | ICD-10-CM | POA: Diagnosis present

## 2016-01-31 DIAGNOSIS — E1122 Type 2 diabetes mellitus with diabetic chronic kidney disease: Secondary | ICD-10-CM | POA: Diagnosis not present

## 2016-01-31 DIAGNOSIS — N390 Urinary tract infection, site not specified: Secondary | ICD-10-CM

## 2016-01-31 DIAGNOSIS — I129 Hypertensive chronic kidney disease with stage 1 through stage 4 chronic kidney disease, or unspecified chronic kidney disease: Secondary | ICD-10-CM | POA: Insufficient documentation

## 2016-01-31 DIAGNOSIS — Z7982 Long term (current) use of aspirin: Secondary | ICD-10-CM | POA: Insufficient documentation

## 2016-01-31 LAB — URINALYSIS, ROUTINE W REFLEX MICROSCOPIC
Bilirubin Urine: NEGATIVE
Glucose, UA: 150 mg/dL — AB
Hgb urine dipstick: NEGATIVE
Ketones, ur: NEGATIVE mg/dL
Leukocytes, UA: NEGATIVE
Nitrite: NEGATIVE
Protein, ur: >=300 mg/dL — AB
Specific Gravity, Urine: 1.014 (ref 1.005–1.030)
pH: 6 (ref 5.0–8.0)

## 2016-01-31 LAB — CBC
HCT: 31.7 % — ABNORMAL LOW (ref 36.0–46.0)
Hemoglobin: 9.9 g/dL — ABNORMAL LOW (ref 12.0–15.0)
MCH: 24.3 pg — ABNORMAL LOW (ref 26.0–34.0)
MCHC: 31.2 g/dL (ref 30.0–36.0)
MCV: 77.9 fL — ABNORMAL LOW (ref 78.0–100.0)
Platelets: 363 10*3/uL (ref 150–400)
RBC: 4.07 MIL/uL (ref 3.87–5.11)
RDW: 15.4 % (ref 11.5–15.5)
WBC: 5 10*3/uL (ref 4.0–10.5)

## 2016-01-31 LAB — COMPREHENSIVE METABOLIC PANEL
ALT: 11 U/L — ABNORMAL LOW (ref 14–54)
AST: 20 U/L (ref 15–41)
Albumin: 2.9 g/dL — ABNORMAL LOW (ref 3.5–5.0)
Alkaline Phosphatase: 35 U/L — ABNORMAL LOW (ref 38–126)
Anion gap: 12 (ref 5–15)
BUN: 70 mg/dL — ABNORMAL HIGH (ref 6–20)
CO2: 20 mmol/L — ABNORMAL LOW (ref 22–32)
Calcium: 7.5 mg/dL — ABNORMAL LOW (ref 8.9–10.3)
Chloride: 105 mmol/L (ref 101–111)
Creatinine, Ser: 3.79 mg/dL — ABNORMAL HIGH (ref 0.44–1.00)
GFR calc Af Amer: 16 mL/min — ABNORMAL LOW (ref >60)
GFR calc non Af Amer: 14 mL/min — ABNORMAL LOW (ref >60)
Glucose, Bld: 110 mg/dL — ABNORMAL HIGH (ref 65–99)
Potassium: 3.9 mmol/L (ref 3.5–5.1)
Sodium: 137 mmol/L (ref 135–145)
Total Bilirubin: 0.3 mg/dL (ref 0.3–1.2)
Total Protein: 6.6 g/dL (ref 6.5–8.1)

## 2016-01-31 LAB — LIPASE, BLOOD: Lipase: 30 U/L (ref 11–51)

## 2016-01-31 MED ORDER — PROMETHAZINE HCL 25 MG PO TABS: 12.5000 mg | ORAL_TABLET | Freq: Once | ORAL | Status: DC

## 2016-01-31 MED ORDER — CEFDINIR 300 MG PO CAPS: 300.0000 mg | ORAL_CAPSULE | Freq: Every day | ORAL | 0 refills | Status: AC

## 2016-01-31 MED ORDER — ONDANSETRON HCL 4 MG PO TABS
4.0000 mg | ORAL_TABLET | Freq: Once | ORAL | Status: AC
  Administered 2016-01-31: 4 mg via ORAL
  Filled 2016-01-31: qty 1

## 2016-01-31 NOTE — Discharge Instructions (Signed)
I think your symptoms may be caused from a urinary tract infection.  I will treat this with an antibiotic.  Please ensure you finish the antibiotic as prescribed.  Please ensure you follow up with your kidney doctor next week.  If your symptoms worsen or you develop fever, vomiting or focal abdominal pain please call your doctor or return to the emergency department.

## 2016-01-31 NOTE — ED Triage Notes (Signed)
Pt states she has been having lower abd pain for several days and nausea. Denies vomiting. Pt has had decreased appetite.

## 2016-01-31 NOTE — ED Provider Notes (Signed)
[facility] DEPT Provider Note   CSN: [id number] Arrival date & time: 01/31/16  4081     History   Chief Complaint Chief Complaint  Patient presents with  . Abdominal Pain  . Nausea    HPI Patricia [name] is a 41 y.o. female.  HPI The patient is a 41 year old female with a history of gastroesophageal reflux disease, CKD Stage IV, hypertension, iron deficiency anemia and type 2 diabetes who presents with abdominal pain. Patient has had bilateral lower quadrant abdominal pain for 3 days. She states the pain feels like needles with some episodes of more intense pain. It does not localize anywhere. She has also had several episodes of diarrhea and nausea but no emesis. She has felt febrile and reports some chills. She's not been around sick contacts. She denies chest pain or cough. She denies increased shortness of breath. She denies polyuria or dysuria. She also reports a "bad" taste in her mouth. She describes this as metallic in nature. She does have a history of gastroesophageal reflux disease but says she takes her medication for this daily. She has no additional complaints today. Past Medical History:  Diagnosis Date  . Acid reflux   . Chronic kidney disease (CKD), stage III (moderate)   . Headache(784.0)    "related to chemo; sometimes weekly" (09/12/2013)  . High cholesterol   . History of blood transfusion "a couple"   "related to low counts"  . Hypertension   . Iron deficiency anemia    "get epogen shots q month" (02/20/2014)  . 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 Type II diabetes mellitus ([place])    "took me off my RX ~ 04/2013"    Patient Active Problem List   Diagnosis Date Noted  . Hypomagnesemia 04/28/2015  . Acute renal failure superimposed on stage 3 chronic kidney disease ([place]) 04/12/2014  . Atypical chest pain   . Chest pain 02/19/2014  . Hypokalemia 02/19/2014  . Nephrotic syndrome 10/02/2013    . Dyslipidemia 10/02/2013  . PAT (paroxysmal atrial tachycardia) ([place]) 09/29/2013  . CKD stage 3 followed at [facility] 09/12/2013  . Symptomatic anemia 06/29/2013  . Edema 06/29/2013  . Fever, unspecified 01/31/2013  . Glomerulonephritis 01/28/2013  . Acute renal failure ([place]) 01/28/2013  . Syncope 01/28/2013  . Chest pain with moderate risk of acute coronary syndrome 07/04/2011  . Shortness of breath 07/04/2011  . Morbid obesity ([place]) 07/04/2011  . HTN (hypertension) 07/04/2011  . DM (diabetes mellitus) ([place]) 07/04/2011  . Hypercholesterolemia 07/04/2011    Past Surgical History:  Procedure Laterality Date  . ABDOMINAL HYSTERECTOMY  2010   "laparoscopic"  . ANKLE FRACTURE SURGERY Right 1994  . CARDIAC CATHETERIZATION  2000's  . FRACTURE SURGERY      OB History    Gravida Para Term Preterm AB Living   3             SAB TAB Ectopic Multiple Live Births         3         Home Medications    Prior to Admission medications   Medication Sig Start Date End Date Taking? Authorizing Provider  albuterol (PROVENTIL HFA;VENTOLIN HFA) 108 (90 Base) MCG/ACT inhaler Inhale 2 puffs into the lungs every 4 (four) hours as needed for wheezing or shortness of breath.  11/02/15   Historical Provider, MD  amLODipine (NORVASC) 5 MG tablet Take 5 mg by mouth daily. 08/12/14  Historical Provider, MD  aspirin 81 MG chewable tablet Chew 1 tablet (81 mg total) by mouth daily. 10/03/13   Eugenie Filler, MD  atorvastatin (LIPITOR) 40 MG tablet Take 40 mg by mouth daily. 03/14/14   Historical Provider, MD  cefdinir (OMNICEF) 300 MG capsule Take 1 capsule (300 mg total) by mouth daily. 01/31/16 02/04/16  Ophelia Shoulder, MD  colchicine 0.6 MG tablet Take 0.6 mg by mouth every other day. 10/30/14 01/11/16  Historical Provider, MD  diphenhydramine-acetaminophen (TYLENOL PM) 25-500 MG TABS tablet Take 2 tablets by mouth at bedtime as needed (for sleep).     Historical Provider, MD  Epoetin Alfa (EPOGEN IJ) Inject 1  application as directed every 14 (fourteen) days.    Historical Provider, MD  febuxostat (ULORIC) 40 MG tablet Take 40 mg by mouth daily. 08/08/15   Historical Provider, MD  gabapentin (NEURONTIN) 300 MG capsule Take 300 mg by mouth daily.  06/12/15 06/11/16  Historical Provider, MD  lisinopril (PRINIVIL,ZESTRIL) 10 MG tablet Take 10 mg by mouth daily.    Historical Provider, MD  LORazepam (ATIVAN) 0.5 MG tablet Take 0.5 mg by mouth every 8 (eight) hours as needed for anxiety.  07/11/15   Historical Provider, MD  magnesium oxide (MAG-OX) 400 MG tablet Take 1 tablet (400 mg total) by mouth daily. 04/29/15   Venetia Maxon Rama, MD  metolazone (ZAROXOLYN) 5 MG tablet Take 5 mg by mouth daily. 10/07/15   Historical Provider, MD  metoprolol tartrate (LOPRESSOR) 25 MG tablet Take 1 tablet (25 mg total) by mouth 2 (two) times daily. 10/03/13   Eugenie Filler, MD  Multiple Vitamin (MULTIVITAMIN WITH MINERALS) TABS tablet Take 1 tablet by mouth daily.    Historical Provider, MD  omeprazole (PRILOSEC) 40 MG capsule Take 40 mg by mouth daily.      Historical Provider, MD  oxyCODONE-acetaminophen (PERCOCET/ROXICET) 5-325 MG tablet Take 1-2 tablets by mouth every 6 (six) hours as needed. Patient taking differently: Take 1-2 tablets by mouth every 6 (six) hours as needed for moderate pain.  09/05/15   Kristen N Ward, DO  potassium chloride (K-DUR) 10 MEQ tablet Take 10 mEq by mouth daily. 08/12/14   Historical Provider, MD  promethazine (PHENERGAN) 25 MG tablet Take 1 tablet (25 mg total) by mouth every 6 (six) hours as needed for nausea or vomiting. 01/23/15   Margarita Mail, PA-C  sertraline (ZOLOFT) 25 MG tablet Take 25 mg by mouth daily.    Historical Provider, MD  temazepam (RESTORIL) 15 MG capsule Take 15 mg by mouth at bedtime as needed for sleep.    Historical Provider, MD  torsemide (DEMADEX) 20 MG tablet Take 2 tablets (40 mg total) by mouth daily. Patient taking differently: Take 80 mg by mouth daily.  04/12/14    Nishant Dhungel, MD  traMADol (ULTRAM) 50 MG tablet Take 1 tablet (50 mg total) by mouth every 6 (six) hours as needed. 01/12/16   Orpah Greek, MD  traZODone (DESYREL) 50 MG tablet Take 50 mg by mouth at bedtime as needed for sleep.     Historical Provider, MD  Vitamin D, Ergocalciferol, (DRISDOL) 50000 units CAPS capsule Take 50,000 Units by mouth once a week. 09/11/15 09/10/16  Historical Provider, MD    Family History Family History  Problem Relation Age of Onset  . Hypertension Mother   . Thyroid disease Mother   . Coronary artery disease Father   . Hypertension Father   . Diabetes Father  Social History Social History  Substance Use Topics  . Smoking status: Never Smoker  . Smokeless tobacco: Never Used  . Alcohol use No     Allergies   Nsaids and Tolmetin   Review of Systems Review of Systems  All other systems reviewed and are negative.    Physical Exam Updated Vital Signs BP 114/78   Pulse 80   Temp 98.4 F (36.9 C) (Oral)   Resp 20   Ht $R'5\' 7"'gE$  (1.702 m)   Wt 108.9 kg   SpO2 100%   BMI 37.59 kg/m   Physical Exam  Constitutional: She is oriented to person, place, and time. She appears well-developed and well-nourished.  HENT:  Head: Normocephalic and atraumatic.  Cardiovascular: Normal rate and regular rhythm.  Exam reveals no gallop and no friction rub.   No murmur heard. Pulmonary/Chest: Effort normal and breath sounds normal. No respiratory distress. She has no wheezes.  Abdominal: Soft. Bowel sounds are normal. She exhibits no distension. There is no tenderness.  No tenderness to palpation. No peritoneal signs. No guarding. Bowel sounds present. Abdominal examination benign.  Musculoskeletal: She exhibits no edema.  Neurological: She is alert and oriented to person, place, and time.     ED Treatments / Results  Labs (all labs ordered are listed, but only abnormal results are displayed) Labs Reviewed  COMPREHENSIVE METABOLIC PANEL -  Abnormal; Notable for the following:       Result Value   CO2 20 (*)    Glucose, Bld 110 (*)    BUN 70 (*)    Creatinine, Ser 3.79 (*)    Calcium 7.5 (*)    Albumin 2.9 (*)    ALT 11 (*)    Alkaline Phosphatase 35 (*)    GFR calc non Af Amer 14 (*)    GFR calc Af Amer 16 (*)    All other components within normal limits  CBC - Abnormal; Notable for the following:    Hemoglobin 9.9 (*)    HCT 31.7 (*)    MCV 77.9 (*)    MCH 24.3 (*)    All other components within normal limits  URINALYSIS, ROUTINE W REFLEX MICROSCOPIC - Abnormal; Notable for the following:    Glucose, UA 150 (*)    Protein, ur >=300 (*)    Bacteria, UA MANY (*)    Squamous Epithelial / LPF 0-5 (*)    All other components within normal limits  LIPASE, BLOOD    EKG  EKG Interpretation None       Radiology No results found.  Procedures Procedures (including critical care time)  Medications Ordered in ED Medications  ondansetron (ZOFRAN) tablet 4 mg (4 mg Oral Given 01/31/16 1235)     Initial Impression / Assessment and Plan / ED Course  I have reviewed the triage vital signs and the nursing notes.  Pertinent labs & imaging results that were available during my care of the patient were reviewed by me and considered in my medical decision making (see chart for details).    Patient presents with a three-day history of bilateral lower quadrant abdominal pain. It is not focal. Physical examination is nonrevealing. There are no peritoneal signs. There is no guarding. There is no focal area of tenderness on palpation. Laboratory evaluation shows anemia but no leukocytosis. CMP is consistent with CKD stage IV. Of note her BUN has increased from 42-70 and her creatinine from 3.44-3.79 over the past 2 weeks. Her lipase was normal. Urinalysis revealed  many bacteria. Clinically, I do think the patient has cystitis as she has crampy midline lower abdominal pain. She also has some nausea associated with this. I'll  treat the patient with a short course of antibiotics for presumed cystitis. She has follow-up with nephrology next week. I encouraged her to keep up and ensure she makes this appointment. Currently, she is afebrile hemodynamically stable. Her abdominal pain and nausea was improved with Zofran. Return precautions discussed. She is appropriate for discharge.  Final Clinical Impressions(s) / ED Diagnoses   Final diagnoses:  Lower urinary tract infectious disease    New Prescriptions New Prescriptions   CEFDINIR (OMNICEF) 300 MG CAPSULE    Take 1 capsule (300 mg total) by mouth daily.     Ophelia Shoulder, MD 01/31/16 [place], MD 02/03/16 [phone]

## 2016-02-18 ENCOUNTER — Emergency Department (HOSPITAL_COMMUNITY): Payer: Medicare Other

## 2016-02-18 ENCOUNTER — Emergency Department (HOSPITAL_COMMUNITY)
Admission: EM | Admit: 2016-02-18 | Discharge: 2016-02-18 | Disposition: A | Discharge status: Home / Self Care | Payer: Medicare Other | Attending: Emergency Medicine | Admitting: Emergency Medicine

## 2016-02-18 ENCOUNTER — Encounter (HOSPITAL_COMMUNITY): Payer: Self-pay | Admitting: Emergency Medicine

## 2016-02-18 DIAGNOSIS — E1122 Type 2 diabetes mellitus with diabetic chronic kidney disease: Secondary | ICD-10-CM | POA: Insufficient documentation

## 2016-02-18 DIAGNOSIS — Z7982 Long term (current) use of aspirin: Secondary | ICD-10-CM | POA: Diagnosis not present

## 2016-02-18 DIAGNOSIS — N184 Chronic kidney disease, stage 4 (severe): Secondary | ICD-10-CM

## 2016-02-18 DIAGNOSIS — Z79899 Other long term (current) drug therapy: Secondary | ICD-10-CM | POA: Insufficient documentation

## 2016-02-18 DIAGNOSIS — R42 Dizziness and giddiness: Secondary | ICD-10-CM | POA: Insufficient documentation

## 2016-02-18 DIAGNOSIS — I129 Hypertensive chronic kidney disease with stage 1 through stage 4 chronic kidney disease, or unspecified chronic kidney disease: Secondary | ICD-10-CM | POA: Diagnosis not present

## 2016-02-18 LAB — BASIC METABOLIC PANEL
ANION GAP: 15 (ref 5–15)
BUN: 97 mg/dL — ABNORMAL HIGH (ref 6–20)
CALCIUM: 7.1 mg/dL — AB (ref 8.9–10.3)
CO2: 20 mmol/L — ABNORMAL LOW (ref 22–32)
CREATININE: 4.93 mg/dL — AB (ref 0.44–1.00)
Chloride: 103 mmol/L (ref 101–111)
GFR calc Af Amer: 12 mL/min — ABNORMAL LOW (ref >60)
GFR, EST NON AFRICAN AMERICAN: 10 mL/min — AB (ref >60)
GLUCOSE: 112 mg/dL — AB (ref 65–99)
Potassium: 4.1 mmol/L (ref 3.5–5.1)
Sodium: 138 mmol/L (ref 135–145)

## 2016-02-18 LAB — I-STAT TROPONIN, ED: TROPONIN I, POC: 0 ng/mL (ref 0.00–0.08)

## 2016-02-18 LAB — CBC
HCT: 31.5 % — ABNORMAL LOW (ref 36.0–46.0)
Hemoglobin: 9.9 g/dL — ABNORMAL LOW (ref 12.0–15.0)
MCH: 24.6 pg — AB (ref 26.0–34.0)
MCHC: 31.4 g/dL (ref 30.0–36.0)
MCV: 78.4 fL (ref 78.0–100.0)
PLATELETS: 318 10*3/uL (ref 150–400)
RBC: 4.02 MIL/uL (ref 3.87–5.11)
RDW: 16.7 % — AB (ref 11.5–15.5)
WBC: 4.8 10*3/uL (ref 4.0–10.5)

## 2016-02-18 NOTE — Discharge Instructions (Signed)
Please read and follow all provided instructions.  Your diagnoses today include:  1. Stage 4 chronic kidney disease ([place])   2. Dizziness    Tests performed today include: Vital signs. See below for your results today.   Medications prescribed:  Take as prescribed   Home care instructions:  Follow any educational materials contained in this packet.  Follow-up instructions: Please follow-up with nephrology for further evaluation of symptoms and treatment   Return instructions:  Please return to the Emergency Department if you do not get better, if you get worse, or new symptoms OR  - Fever (temperature greater than 101.19F)  - Bleeding that does not stop with holding pressure to the area    -Severe pain (please note that you may be more sore the day after your accident)  - Chest Pain  - Difficulty breathing  - Severe nausea or vomiting  - Inability to tolerate food and liquids  - Passing out  - Skin becoming red around your wounds  - Change in mental status (confusion or lethargy)  - New numbness or weakness    Please return if you have any other emergent concerns.  Additional Information:  Your vital signs today were: BP 124/76    Pulse 73    Temp 98.7 F (37.1 C) (Oral)    Resp 16    SpO2 100%  If your blood pressure (BP) was elevated above 135/85 this visit, please have this repeated by your doctor within one month. ---------------

## 2016-02-18 NOTE — ED Triage Notes (Signed)
Pt here for mid sternal CP and abnormal labs with worsening kidney levels and Ca 6.5; pt sts some dizziness and near syncope lately

## 2016-02-18 NOTE — ED Provider Notes (Signed)
[facility] DEPT Provider Note   CSN: [id number] Arrival date & time: 02/18/16  1102     History   Chief Complaint Chief Complaint  Patient presents with  . Abnormal Lab  . Chest Pain    HPI Patricia [name] is a 41 y.o. female.  HPI  41 y.o. female with a hx of CKD Stage IV-V, HTN, DM2, presents to the Emergency Department today complaining of mid sternal chest pain with onset several weeks ago. Notes intermittent chest discomfort that is worse with exertion and relieved with rest. No chest pain currently. No N/V. Notes mild shortness of breath. No diaphoresis. Also noted worsening [name] levels from baseline. States associated dizziness and near syncope lately as well. Pt states that she saw her PCP yesterday for a check up and was called this morning with lab results concerning for low calcium as well as elevated Creatinie from baseline. Pt does have fistula placed in October and has been seen by nephrology in [facility] for eventual Dialysis treatment. No center has been established for her yet. No other symptoms noted.   Past Medical History:  Diagnosis Date  . Acid reflux   . Chronic [name] disease (CKD), stage III (moderate)   . Headache(784.0)    "related to chemo; sometimes weekly" (09/12/2013)  . High cholesterol   . History of blood transfusion "a couple"   "related to low counts"  . Hypertension   . Iron deficiency anemia    "get epogen shots q month" (02/20/2014)  . 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 Type II diabetes mellitus ([place])    "took me off my RX ~ 04/2013"    Patient Active Problem List   Diagnosis Date Noted  . Hypomagnesemia 04/28/2015  . Acute renal failure superimposed on stage 3 chronic [name] disease ([place]) 04/12/2014  . Atypical chest pain   . Chest pain 02/19/2014  . Hypokalemia 02/19/2014  . Nephrotic syndrome 10/02/2013  . Dyslipidemia 10/02/2013  . PAT (paroxysmal atrial  tachycardia) ([place]) 09/29/2013  . CKD stage 3 followed at [facility] 09/12/2013  . Symptomatic anemia 06/29/2013  . Edema 06/29/2013  . Fever, unspecified 01/31/2013  . Glomerulonephritis 01/28/2013  . Acute renal failure ([place]) 01/28/2013  . Syncope 01/28/2013  . Chest pain with moderate risk of acute coronary syndrome 07/04/2011  . Shortness of breath 07/04/2011  . Morbid obesity ([place]) 07/04/2011  . HTN (hypertension) 07/04/2011  . DM (diabetes mellitus) ([place]) 07/04/2011  . Hypercholesterolemia 07/04/2011    Past Surgical History:  Procedure Laterality Date  . ABDOMINAL HYSTERECTOMY  2010   "laparoscopic"  . ANKLE FRACTURE SURGERY Right 1994  . CARDIAC CATHETERIZATION  2000's  . FRACTURE SURGERY      OB History    Gravida Para Term Preterm AB Living   3             SAB TAB Ectopic Multiple Live Births         3         Home Medications    Prior to Admission medications   Medication Sig Start Date End Date Taking? Authorizing Provider  albuterol (PROVENTIL HFA;VENTOLIN HFA) 108 (90 Base) MCG/ACT inhaler Inhale 2 puffs into the lungs every 4 (four) hours as needed for wheezing or shortness of breath.  11/02/15  Yes Historical Provider, MD  allopurinol (ZYLOPRIM) 100 MG tablet Take 100 mg by mouth daily.   Yes Historical Provider, MD  amLODipine (NORVASC) 5 MG tablet Take 5 mg by mouth daily. 08/12/14  Yes Historical Provider, MD  aspirin 81 MG chewable tablet Chew 1 tablet (81 mg total) by mouth daily. 10/03/13  Yes Eugenie Filler, MD  atorvastatin (LIPITOR) 40 MG tablet Take 40 mg by mouth daily. 03/14/14  Yes Historical Provider, MD  calcitRIOL (ROCALTROL) 0.25 MCG capsule Take 0.25 mcg by mouth every Monday, Wednesday, and Friday. 02/05/16  Yes Historical Provider, MD  diphenhydramine-acetaminophen (TYLENOL PM) 25-500 MG TABS tablet Take 2 tablets by mouth at bedtime as needed (for sleep).    Yes Historical Provider, MD  doxepin (SINEQUAN) 10 MG capsule Take 10 mg by mouth at  bedtime. 12/08/15  Yes Historical Provider, MD  Epoetin Alfa (EPOGEN IJ) Inject 300 Units as directed every 14 (fourteen) days.    Yes Historical Provider, MD  lisinopril (PRINIVIL,ZESTRIL) 10 MG tablet Take 10 mg by mouth daily.   Yes Historical Provider, MD  LORazepam (ATIVAN) 0.5 MG tablet Take 0.5 mg by mouth every 8 (eight) hours as needed for anxiety.  07/11/15  Yes Historical Provider, MD  magnesium oxide (MAG-OX) 400 MG tablet Take 1 tablet (400 mg total) by mouth daily. 04/29/15  Yes Venetia Maxon Rama, MD  meclizine (ANTIVERT) 12.5 MG tablet Take 12.5 mg by mouth 3 (three) times daily as needed for anxiety. 02/17/16  Yes Historical Provider, MD  metolazone (ZAROXOLYN) 5 MG tablet Take 5 mg by mouth daily. 10/07/15  Yes Historical Provider, MD  metoprolol tartrate (LOPRESSOR) 25 MG tablet Take 1 tablet (25 mg total) by mouth 2 (two) times daily. 10/03/13  Yes Eugenie Filler, MD  Multiple Vitamin (MULTIVITAMIN WITH MINERALS) TABS tablet Take 1 tablet by mouth daily.   Yes Historical Provider, MD  omeprazole (PRILOSEC) 40 MG capsule Take 40 mg by mouth daily.     Yes Historical Provider, MD  potassium chloride (K-DUR) 10 MEQ tablet Take 10 mEq by mouth daily. 08/12/14  Yes Historical Provider, MD  promethazine (PHENERGAN) 25 MG tablet Take 1 tablet (25 mg total) by mouth every 6 (six) hours as needed for nausea or vomiting. 01/23/15  Yes Margarita Mail, PA-C  sertraline (ZOLOFT) 25 MG tablet Take 25 mg by mouth daily.   Yes Historical Provider, MD  temazepam (RESTORIL) 15 MG capsule Take 15 mg by mouth at bedtime as needed for sleep.   Yes Historical Provider, MD  torsemide (DEMADEX) 20 MG tablet Take 2 tablets (40 mg total) by mouth daily. Patient taking differently: Take 80 mg by mouth daily.  04/12/14  Yes Nishant Dhungel, MD  traZODone (DESYREL) 50 MG tablet Take 50 mg by mouth at bedtime as needed for sleep.    Yes Historical Provider, MD  Vitamin D, Ergocalciferol, (DRISDOL) 50000 units CAPS  capsule Take 50,000 Units by mouth once a week. 09/11/15 09/10/16 Yes Historical Provider, MD  colchicine 0.6 MG tablet Take 0.6 mg by mouth every other day. 10/30/14 01/11/16  Historical Provider, MD  oxyCODONE-acetaminophen (PERCOCET/ROXICET) 5-325 MG tablet Take 1-2 tablets by mouth every 6 (six) hours as needed. Patient not taking: Reported on 02/18/2016 09/05/15   Delice Bison Ward, DO  traMADol (ULTRAM) 50 MG tablet Take 1 tablet (50 mg total) by mouth every 6 (six) hours as needed. Patient not taking: Reported on 02/18/2016 01/12/16   Orpah Greek, MD    Family History Family History  Problem Relation Age of Onset  . Hypertension Mother   . Thyroid disease Mother   . Coronary  artery disease Father   . Hypertension Father   . Diabetes Father     Social History Social History  Substance Use Topics  . Smoking status: Never Smoker  . Smokeless tobacco: Never Used  . Alcohol use No     Allergies   Nsaids and Tolmetin   Review of Systems Review of Systems ROS reviewed and all are negative for acute change except as noted in the HPI.  Physical Exam Updated Vital Signs BP 105/65   Pulse 68   Temp 98.7 F (37.1 C) (Oral)   Resp 15   SpO2 100%   Physical Exam  Constitutional: She is oriented to person, place, and time. Vital signs are normal. She appears well-developed and well-nourished.  HENT:  Head: Normocephalic and atraumatic.  Right Ear: Hearing normal.  Left Ear: Hearing normal.  Eyes: Conjunctivae and EOM are normal. Pupils are equal, round, and reactive to light.  Neck: Normal range of motion. Neck supple.  Cardiovascular: Normal rate, regular rhythm, normal heart sounds and intact distal pulses.   No murmur heard. Pulmonary/Chest: Effort normal and breath sounds normal. No respiratory distress. She has no wheezes. She exhibits no tenderness.  Abdominal: There is no tenderness.  Musculoskeletal: Normal range of motion.  Left RC AVF. Palpable thrill    Neurological: She is alert and oriented to person, place, and time.  Skin: Skin is warm and dry.  Psychiatric: She has a normal mood and affect. Her speech is normal and behavior is normal. Thought content normal.  Nursing note and vitals reviewed.  ED Treatments / Results  Labs (all labs ordered are listed, but only abnormal results are displayed) Labs Reviewed  BASIC METABOLIC PANEL - Abnormal; Notable for the following:       Result Value   CO2 20 (*)    Glucose, Bld 112 (*)    BUN 97 (*)    Creatinine, Ser 4.93 (*)    Calcium 7.1 (*)    GFR calc non Af Amer 10 (*)    GFR calc Af Amer 12 (*)    All other components within normal limits  CBC - Abnormal; Notable for the following:    Hemoglobin 9.9 (*)    HCT 31.5 (*)    MCH 24.6 (*)    RDW 16.7 (*)    All other components within normal limits  I-STAT TROPOININ, ED    EKG  EKG Interpretation  Date/Time:  Tuesday February 18 2016 11:13:27 EST Ventricular Rate:  75 PR Interval:  146 QRS Duration: 88 QT Interval:  412 QTC Calculation: 460 R Axis:   18 Text Interpretation:  Normal sinus rhythm Cannot rule out Anterior infarct , age undetermined Abnormal ECG Confirmed by Lacinda Axon  MD, BRIAN ([id number]) on 02/18/2016 3:32:30 PM       Radiology Dg Chest 2 View  Result Date: 02/18/2016 CLINICAL DATA:  Chest pain EXAM: CHEST  2 VIEW COMPARISON:  01/12/2016 FINDINGS: Heart and mediastinal contours are within normal limits. No focal opacities or effusions. No acute bony abnormality. IMPRESSION: No active cardiopulmonary disease. Electronically Signed   By: Rolm Baptise M.D.   On: 02/18/2016 11:45    Procedures Procedures (including critical care time)  Medications Ordered in ED Medications - No data to display   Initial Impression / Assessment and Plan / ED Course  I have reviewed the triage vital signs and the nursing notes.  Pertinent labs & imaging results that were available during my care of the patient were reviewed  by  me and considered in my medical decision making (see chart for details).  Final Clinical Impressions(s) / ED Diagnoses  {I have reviewed and evaluated the relevant laboratory values. {I have reviewed and evaluated the relevant imaging studies. {I have interpreted the relevant EKG. {I have reviewed the relevant previous healthcare records.  {I obtained HPI from historian. {Patient discussed with supervising physician.  ED Course:  Assessment: Pt is a 40yF with hx CKD Stage IV-V, HTN, DM2 who presents with chest pain on and off for several weeks. Told by PCP to come to ED this AM due to low calcium and elevated creatinine from baseline from previous lab draw yesterday. Pt is likely dialysis candidate with fistula placed in October from Nephrology in [place]. No chest pain or SOB currently.  Perc negative, VSS, no tracheal deviation, no JVD or new murmur, RRR, breath sounds equal bilaterally, EKG without acute abnormalities, negative troponin. On exam, pt in NAD. Nontoxic/nonseptic appearing. VSS. Afebrile. CBC baseline. BMP with creatinine 4.93 (3.79 on 01-31-16), Potassium 4.1. Calcium 7.1. EKG unremarkable. Discussed with attending physician. Consulted nephrology. No emergent ned for Dialysis. Will have patient call office to establish care in [place]. Plan is to DC home with follow up to Nephrology. At time of discharge, Patient is in no acute distress. Vital Signs are stable. Patient is able to ambulate. Patient able to tolerate PO.   Disposition/Plan:  DC Home Additional Verbal discharge instructions given and discussed with patient.  Pt Instructed to f/u with Nephrology in the next week for evaluation and treatment of symptoms. Return precautions given Pt acknowledges and agrees with plan  Supervising Physician Carmin Muskrat, MD  Final diagnoses:  Stage 4 chronic [name] disease [facility])  Dizziness    New Prescriptions New Prescriptions   No medications on file     Shary Decamp,  PA-C 02/18/16 [place], MD 02/18/16 [id number]

## 2016-04-01 ENCOUNTER — Emergency Department (HOSPITAL_COMMUNITY): Payer: Medicare Other

## 2016-04-01 ENCOUNTER — Encounter (HOSPITAL_COMMUNITY): Payer: Self-pay | Admitting: Emergency Medicine

## 2016-04-01 ENCOUNTER — Inpatient Hospital Stay (HOSPITAL_COMMUNITY)
Admission: EM | Admit: 2016-04-01 | Discharge: 2016-04-15 | DRG: 252 | Disposition: A | Discharge status: Home Health | Payer: Medicare Other | Attending: Family Medicine | Admitting: Family Medicine

## 2016-04-01 DIAGNOSIS — Z886 Allergy status to analgesic agent status: Secondary | ICD-10-CM

## 2016-04-01 DIAGNOSIS — N184 Chronic kidney disease, stage 4 (severe): Secondary | ICD-10-CM

## 2016-04-01 DIAGNOSIS — E1122 Type 2 diabetes mellitus with diabetic chronic kidney disease: Secondary | ICD-10-CM | POA: Diagnosis not present

## 2016-04-01 DIAGNOSIS — I10 Essential (primary) hypertension: Secondary | ICD-10-CM | POA: Diagnosis present

## 2016-04-01 DIAGNOSIS — R112 Nausea with vomiting, unspecified: Secondary | ICD-10-CM | POA: Diagnosis not present

## 2016-04-01 DIAGNOSIS — K219 Gastro-esophageal reflux disease without esophagitis: Secondary | ICD-10-CM | POA: Diagnosis not present

## 2016-04-01 DIAGNOSIS — I4581 Long QT syndrome: Secondary | ICD-10-CM | POA: Diagnosis present

## 2016-04-01 DIAGNOSIS — D631 Anemia in chronic kidney disease: Secondary | ICD-10-CM | POA: Diagnosis present

## 2016-04-01 DIAGNOSIS — E8770 Fluid overload, unspecified: Secondary | ICD-10-CM | POA: Diagnosis present

## 2016-04-01 DIAGNOSIS — N189 Chronic kidney disease, unspecified: Secondary | ICD-10-CM

## 2016-04-01 DIAGNOSIS — R000 Tachycardia, unspecified: Secondary | ICD-10-CM | POA: Diagnosis present

## 2016-04-01 DIAGNOSIS — E7800 Pure hypercholesterolemia, unspecified: Secondary | ICD-10-CM | POA: Diagnosis present

## 2016-04-01 DIAGNOSIS — Z992 Dependence on renal dialysis: Secondary | ICD-10-CM | POA: Diagnosis present

## 2016-04-01 DIAGNOSIS — N186 End stage renal disease: Secondary | ICD-10-CM | POA: Diagnosis not present

## 2016-04-01 DIAGNOSIS — R079 Chest pain, unspecified: Secondary | ICD-10-CM | POA: Diagnosis not present

## 2016-04-01 DIAGNOSIS — Z833 Family history of diabetes mellitus: Secondary | ICD-10-CM

## 2016-04-01 DIAGNOSIS — R110 Nausea: Secondary | ICD-10-CM | POA: Diagnosis present

## 2016-04-01 DIAGNOSIS — I120 Hypertensive chronic kidney disease with stage 5 chronic kidney disease or end stage renal disease: Secondary | ICD-10-CM | POA: Diagnosis present

## 2016-04-01 DIAGNOSIS — Z8349 Family history of other endocrine, nutritional and metabolic diseases: Secondary | ICD-10-CM

## 2016-04-01 DIAGNOSIS — E785 Hyperlipidemia, unspecified: Secondary | ICD-10-CM | POA: Diagnosis present

## 2016-04-01 DIAGNOSIS — I160 Hypertensive urgency: Secondary | ICD-10-CM | POA: Diagnosis not present

## 2016-04-01 DIAGNOSIS — K5900 Constipation, unspecified: Secondary | ICD-10-CM | POA: Diagnosis not present

## 2016-04-01 DIAGNOSIS — Z4901 Encounter for fitting and adjustment of extracorporeal dialysis catheter: Secondary | ICD-10-CM

## 2016-04-01 DIAGNOSIS — E119 Type 2 diabetes mellitus without complications: Secondary | ICD-10-CM

## 2016-04-01 DIAGNOSIS — E872 Acidosis: Secondary | ICD-10-CM | POA: Diagnosis present

## 2016-04-01 DIAGNOSIS — E669 Obesity, unspecified: Secondary | ICD-10-CM | POA: Diagnosis present

## 2016-04-01 DIAGNOSIS — F419 Anxiety disorder, unspecified: Secondary | ICD-10-CM | POA: Diagnosis present

## 2016-04-01 DIAGNOSIS — Z8249 Family history of ischemic heart disease and other diseases of the circulatory system: Secondary | ICD-10-CM

## 2016-04-01 DIAGNOSIS — R0789 Other chest pain: Secondary | ICD-10-CM | POA: Diagnosis present

## 2016-04-01 DIAGNOSIS — R34 Anuria and oliguria: Secondary | ICD-10-CM | POA: Diagnosis present

## 2016-04-01 DIAGNOSIS — N2581 Secondary hyperparathyroidism of renal origin: Secondary | ICD-10-CM | POA: Diagnosis present

## 2016-04-01 DIAGNOSIS — Z90710 Acquired absence of both cervix and uterus: Secondary | ICD-10-CM

## 2016-04-01 DIAGNOSIS — Z95828 Presence of other vascular implants and grafts: Secondary | ICD-10-CM

## 2016-04-01 DIAGNOSIS — Z79899 Other long term (current) drug therapy: Secondary | ICD-10-CM

## 2016-04-01 DIAGNOSIS — Z6837 Body mass index (BMI) 37.0-37.9, adult: Secondary | ICD-10-CM

## 2016-04-01 DIAGNOSIS — Z7982 Long term (current) use of aspirin: Secondary | ICD-10-CM

## 2016-04-01 LAB — CBC
HCT: 28.5 % — ABNORMAL LOW (ref 36.0–46.0)
HEMOGLOBIN: 9.1 g/dL — AB (ref 12.0–15.0)
MCH: 24.3 pg — AB (ref 26.0–34.0)
MCHC: 31.9 g/dL (ref 30.0–36.0)
MCV: 76.2 fL — AB (ref 78.0–100.0)
PLATELETS: 325 10*3/uL (ref 150–400)
RBC: 3.74 MIL/uL — AB (ref 3.87–5.11)
RDW: 16.5 % — AB (ref 11.5–15.5)
WBC: 5.3 10*3/uL (ref 4.0–10.5)

## 2016-04-01 LAB — I-STAT TROPONIN, ED: Troponin i, poc: 0 ng/mL (ref 0.00–0.08)

## 2016-04-01 LAB — BASIC METABOLIC PANEL
Anion gap: 13 (ref 5–15)
BUN: 40 mg/dL — ABNORMAL HIGH (ref 6–20)
CALCIUM: 7.4 mg/dL — AB (ref 8.9–10.3)
CHLORIDE: 113 mmol/L — AB (ref 101–111)
CO2: 15 mmol/L — AB (ref 22–32)
CREATININE: 4.86 mg/dL — AB (ref 0.44–1.00)
GFR calc non Af Amer: 10 mL/min — ABNORMAL LOW (ref >60)
GFR, EST AFRICAN AMERICAN: 12 mL/min — AB (ref >60)
GLUCOSE: 90 mg/dL (ref 65–99)
Potassium: 4.7 mmol/L (ref 3.5–5.1)
Sodium: 141 mmol/L (ref 135–145)

## 2016-04-01 MED ORDER — ATORVASTATIN CALCIUM 40 MG PO TABS
40.0000 mg | ORAL_TABLET | Freq: Every day | ORAL | Status: DC
  Administered 2016-04-01 – 2016-04-15 (×15): 40 mg via ORAL
  Filled 2016-04-01 (×16): qty 1

## 2016-04-01 MED ORDER — CALCIUM ACETATE (PHOS BINDER) 667 MG PO CAPS
1334.0000 mg | ORAL_CAPSULE | Freq: Three times a day (TID) | ORAL | Status: DC
  Administered 2016-04-02 – 2016-04-15 (×32): 1334 mg via ORAL
  Filled 2016-04-01 (×30): qty 2

## 2016-04-01 MED ORDER — MORPHINE SULFATE (PF) 4 MG/ML IV SOLN
2.0000 mg | INTRAVENOUS | Status: DC | PRN
  Administered 2016-04-02 – 2016-04-14 (×9): 2 mg via INTRAVENOUS
  Filled 2016-04-01 (×9): qty 1

## 2016-04-01 MED ORDER — HYDRALAZINE HCL 20 MG/ML IJ SOLN: 10.0000 mg | INTRAMUSCULAR | Status: DC | PRN

## 2016-04-01 MED ORDER — FERROUS SULFATE 325 (65 FE) MG PO TBEC: 325.0000 mg | DELAYED_RELEASE_TABLET | Freq: Every day | ORAL | Status: DC

## 2016-04-01 MED ORDER — MAGNESIUM OXIDE 400 MG PO TABS: 400.0000 mg | ORAL_TABLET | Freq: Every day | ORAL | Status: DC

## 2016-04-01 MED ORDER — TEMAZEPAM 15 MG PO CAPS
15.0000 mg | ORAL_CAPSULE | Freq: Every evening | ORAL | Status: DC | PRN
  Administered 2016-04-02 – 2016-04-14 (×14): 15 mg via ORAL
  Filled 2016-04-01 (×14): qty 1

## 2016-04-01 MED ORDER — MORPHINE SULFATE (PF) 4 MG/ML IV SOLN
4.0000 mg | Freq: Once | INTRAVENOUS | Status: AC
  Administered 2016-04-01: 4 mg via INTRAVENOUS
  Filled 2016-04-01: qty 1

## 2016-04-01 MED ORDER — ONDANSETRON HCL 4 MG/2ML IJ SOLN
4.0000 mg | Freq: Once | INTRAMUSCULAR | Status: AC
  Administered 2016-04-01: 4 mg via INTRAVENOUS
  Filled 2016-04-01: qty 2

## 2016-04-01 MED ORDER — ACETAMINOPHEN 325 MG PO TABS
650.0000 mg | ORAL_TABLET | ORAL | Status: DC | PRN
  Administered 2016-04-02 – 2016-04-13 (×4): 650 mg via ORAL
  Filled 2016-04-01 (×3): qty 2

## 2016-04-01 MED ORDER — ALLOPURINOL 100 MG PO TABS
100.0000 mg | ORAL_TABLET | Freq: Every day | ORAL | Status: DC
  Administered 2016-04-02 – 2016-04-15 (×13): 100 mg via ORAL
  Filled 2016-04-01 (×13): qty 1

## 2016-04-01 MED ORDER — PROMETHAZINE HCL 25 MG PO TABS
25.0000 mg | ORAL_TABLET | Freq: Four times a day (QID) | ORAL | Status: AC | PRN
  Administered 2016-04-02 (×2): 25 mg via ORAL
  Filled 2016-04-01 (×2): qty 1

## 2016-04-01 MED ORDER — LORAZEPAM 0.5 MG PO TABS
0.5000 mg | ORAL_TABLET | Freq: Three times a day (TID) | ORAL | Status: DC | PRN
  Administered 2016-04-03 – 2016-04-13 (×10): 0.5 mg via ORAL
  Filled 2016-04-01 (×10): qty 1

## 2016-04-01 MED ORDER — AMLODIPINE BESYLATE 10 MG PO TABS
10.0000 mg | ORAL_TABLET | Freq: Every day | ORAL | Status: DC
  Administered 2016-04-02 – 2016-04-06 (×5): 10 mg via ORAL
  Filled 2016-04-01 (×5): qty 1

## 2016-04-01 MED ORDER — MAGNESIUM OXIDE 400 (241.3 MG) MG PO TABS
400.0000 mg | ORAL_TABLET | Freq: Every day | ORAL | Status: DC
  Administered 2016-04-02 – 2016-04-06 (×5): 400 mg via ORAL
  Filled 2016-04-01 (×5): qty 1

## 2016-04-01 MED ORDER — METOPROLOL TARTRATE 25 MG PO TABS
25.0000 mg | ORAL_TABLET | Freq: Two times a day (BID) | ORAL | Status: DC
  Administered 2016-04-01 – 2016-04-02 (×2): 25 mg via ORAL
  Filled 2016-04-01 (×2): qty 1

## 2016-04-01 MED ORDER — GI COCKTAIL ~~LOC~~: 30.0000 mL | Freq: Four times a day (QID) | ORAL | Status: DC | PRN

## 2016-04-01 MED ORDER — ASPIRIN 81 MG PO CHEW
81.0000 mg | CHEWABLE_TABLET | Freq: Every day | ORAL | Status: DC
  Administered 2016-04-02 – 2016-04-15 (×13): 81 mg via ORAL
  Filled 2016-04-01 (×13): qty 1

## 2016-04-01 MED ORDER — FERROUS SULFATE 325 (65 FE) MG PO TABS
325.0000 mg | ORAL_TABLET | Freq: Every day | ORAL | Status: DC
  Administered 2016-04-02 – 2016-04-06 (×5): 325 mg via ORAL
  Filled 2016-04-01 (×6): qty 1

## 2016-04-01 MED ORDER — ONDANSETRON HCL 4 MG/2ML IJ SOLN
4.0000 mg | Freq: Four times a day (QID) | INTRAMUSCULAR | Status: DC | PRN
  Administered 2016-04-02 – 2016-04-14 (×11): 4 mg via INTRAVENOUS
  Filled 2016-04-01 (×11): qty 2

## 2016-04-01 MED ORDER — HEPARIN SODIUM (PORCINE) 5000 UNIT/ML IJ SOLN
5000.0000 [IU] | Freq: Three times a day (TID) | INTRAMUSCULAR | Status: DC
  Administered 2016-04-02 – 2016-04-05 (×11): 5000 [IU] via SUBCUTANEOUS
  Filled 2016-04-01 (×18): qty 1

## 2016-04-01 MED ORDER — DIPHENHYDRAMINE-APAP (SLEEP) 25-500 MG PO TABS: 2.0000 | ORAL_TABLET | Freq: Every evening | ORAL | Status: DC | PRN

## 2016-04-01 MED ORDER — SODIUM CHLORIDE 0.9 % IV SOLN
1.0000 g | Freq: Once | INTRAVENOUS | Status: AC
  Administered 2016-04-01: 1 g via INTRAVENOUS
  Filled 2016-04-01: qty 10

## 2016-04-01 MED ORDER — LABETALOL HCL 5 MG/ML IV SOLN
20.0000 mg | Freq: Once | INTRAVENOUS | Status: AC
  Administered 2016-04-01: 20 mg via INTRAVENOUS
  Filled 2016-04-01: qty 4

## 2016-04-01 MED ORDER — SERTRALINE HCL 50 MG PO TABS
50.0000 mg | ORAL_TABLET | Freq: Every day | ORAL | Status: DC
  Administered 2016-04-02 – 2016-04-15 (×14): 50 mg via ORAL
  Filled 2016-04-01 (×14): qty 1

## 2016-04-01 MED ORDER — ADULT MULTIVITAMIN W/MINERALS CH
1.0000 | ORAL_TABLET | Freq: Every day | ORAL | Status: DC
Start: 2016-04-02 — End: 2016-04-15
  Administered 2016-04-02 – 2016-04-15 (×13): 1 via ORAL
  Filled 2016-04-01 (×13): qty 1

## 2016-04-01 MED ORDER — CALCITRIOL 0.25 MCG PO CAPS
0.2500 ug | ORAL_CAPSULE | ORAL | Status: DC
  Administered 2016-04-03 – 2016-04-15 (×6): 0.25 ug via ORAL
  Filled 2016-04-01 (×8): qty 1

## 2016-04-01 MED ORDER — SODIUM BICARBONATE 650 MG PO TABS
650.0000 mg | ORAL_TABLET | Freq: Three times a day (TID) | ORAL | Status: DC
  Administered 2016-04-01 – 2016-04-06 (×13): 650 mg via ORAL
  Filled 2016-04-01 (×14): qty 1

## 2016-04-01 MED ORDER — PANTOPRAZOLE SODIUM 40 MG PO TBEC
40.0000 mg | DELAYED_RELEASE_TABLET | Freq: Every day | ORAL | Status: DC
Start: 2016-04-02 — End: 2016-04-15
  Administered 2016-04-02 – 2016-04-15 (×14): 40 mg via ORAL
  Filled 2016-04-01 (×14): qty 1

## 2016-04-01 MED ORDER — TORSEMIDE 20 MG PO TABS
40.0000 mg | ORAL_TABLET | Freq: Every day | ORAL | Status: DC
  Administered 2016-04-02 – 2016-04-03 (×2): 40 mg via ORAL
  Filled 2016-04-01 (×2): qty 2

## 2016-04-01 MED ORDER — LISINOPRIL 10 MG PO TABS
10.0000 mg | ORAL_TABLET | Freq: Every day | ORAL | Status: DC
  Administered 2016-04-02 – 2016-04-03 (×2): 10 mg via ORAL
  Filled 2016-04-01 (×2): qty 1

## 2016-04-01 NOTE — H&P (Signed)
History and Physical    Patricia [name] ACZ:[id number] DOB: 03/09/1975 DOA: 04/01/2016  Referring MD/NP/PA: Flonnie Overman PA-C PCP: Tereasa Coop, PA-C  Patient coming from:  Home  Chief Complaint: Headache and chest pain  HPI: Patricia [name] is a 41 y.o. 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 Patient notes headache symptoms started on 3 days ago. Headache is global and patient reports felt like a "hard" pain. Complains of sensitivity to light. Tried using BC Goody powder and Tylenol without relief of symptoms. Then yesterday night c/o of intermittent right-sided chest pain that radiated to her back. During this time was blood pressure was elevated as high as 190/108. Her nephrologist's take a additional amlodipine 5mg  which she did without complete relief of symptoms. Associated complaints include nausea, vomiting for at least the last 2 days, decreased appetite, diaphoresis, blurry vision, and shortness of breath with exertion. Denies any palpitations, fever, loss of consciousness, diarrhea, constipation, rash, or focal weakness. She notes that some of these symptoms have been present since her kidney function started to decline. Her nephrologist is Dr. Francis Dowse at [facility]. Patient already has a left-sided fistula in place that is matured. She still makes a small amount of urine at this time. The last few months she's had at least 2 admissions for issues with chest pain and renal failure, but still has not been started on dialysis yet. She just recently had a nuclear stress tests 8 days ago that was noted to be negative for any acute signs of ischemia.  ED Course: Upon admission into the emergency department patient was noted to have elevated blood pressure up to 189/91 with respirations elevated up to 23, and all other vitals within normal limits. Lab work revealed  hemoglobin 9.1, chloride 113, CO2 15, BUN 40, creatinine 4.86, calcium 7.4, and troponin negative. Chest x-ray was clear as well as CT scan of the brain.  Patient was given a total of 40 mg of IV labetalol while in the ED to obtain better control of blood pressure.  Review of Systems: As per HPI otherwise 10 point review of systems negative.   Past Medical History:  Diagnosis Date  . Acid reflux   . Chronic kidney disease (CKD), stage III (moderate)   . Headache(784.0)    "related to chemo; sometimes weekly" (09/12/2013)  . High cholesterol   . History of blood transfusion "a couple"   "related to low counts"  . Hypertension   . Iron deficiency anemia    "get epogen shots q month" (02/20/2014)  . 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 Type II diabetes mellitus ([place])    "took me off my RX ~ 04/2013"    Past Surgical History:  Procedure Laterality Date  . ABDOMINAL HYSTERECTOMY  2010   "laparoscopic"  . ANKLE FRACTURE SURGERY Right 1994  . CARDIAC CATHETERIZATION  2000's  . FRACTURE SURGERY       reports that she has never smoked. She has never used smokeless tobacco. She reports that she does not drink alcohol or use drugs.  Allergies  Allergen Reactions  . Nsaids Other (See Comments)    Cannot take due to Kidney disease  . Tolmetin Other (See Comments)    Cannot take due to Kidney Disease    Family History  Problem  Relation Age of Onset  . Hypertension Mother   . Thyroid disease Mother   . Coronary artery disease Father   . Hypertension Father   . Diabetes Father     Prior to Admission medications   Medication Sig Start Date End Date Taking? Authorizing Provider  allopurinol (ZYLOPRIM) 100 MG tablet Take 100 mg by mouth daily.   Yes Historical Provider, MD  amLODipine (NORVASC) 5 MG tablet Take 5 mg by mouth daily. 08/12/14  Yes Historical Provider, MD  aspirin 81 MG chewable tablet Chew 1 tablet (81 mg  total) by mouth daily. 10/03/13  Yes Eugenie Filler, MD  Aspirin-Acetaminophen-Caffeine (GOODY HEADACHE PO) Take 1 Package by mouth daily as needed (pain).   Yes Historical Provider, MD  atorvastatin (LIPITOR) 40 MG tablet Take 40 mg by mouth daily. 03/14/14  Yes Historical Provider, MD  calcitRIOL (ROCALTROL) 0.25 MCG capsule Take 0.25 mcg by mouth every Monday, Wednesday, and Friday. 02/05/16  Yes Historical Provider, MD  calcium acetate (PHOSLO) 667 MG capsule Take 1,334 mg by mouth See admin instructions. Usually only east once daily 02/24/16 02/23/17 Yes Historical Provider, MD  cholecalciferol (VITAMIN D) 400 units TABS tablet Take 400 Units by mouth daily.   Yes Historical Provider, MD  diphenhydramine-acetaminophen (TYLENOL PM) 25-500 MG TABS tablet Take 2 tablets by mouth at bedtime as needed (for sleep).    Yes Historical Provider, MD  ferrous sulfate 325 (65 FE) MG EC tablet Take 325 mg by mouth daily.   Yes Historical Provider, MD  lisinopril (PRINIVIL,ZESTRIL) 10 MG tablet Take 10 mg by mouth daily.   Yes Historical Provider, MD  LORazepam (ATIVAN) 0.5 MG tablet Take 0.5 mg by mouth every 8 (eight) hours as needed for anxiety.  07/11/15  Yes Historical Provider, MD  magnesium oxide (MAG-OX) 400 MG tablet Take 1 tablet (400 mg total) by mouth daily. 04/29/15  Yes Venetia Maxon Rama, MD  metoprolol tartrate (LOPRESSOR) 25 MG tablet Take 1 tablet (25 mg total) by mouth 2 (two) times daily. 10/03/13  Yes Eugenie Filler, MD  Multiple Vitamin (MULTIVITAMIN WITH MINERALS) TABS tablet Take 1 tablet by mouth daily.   Yes Historical Provider, MD  omeprazole (PRILOSEC) 40 MG capsule Take 40 mg by mouth daily.     Yes Historical Provider, MD  potassium chloride (K-DUR) 10 MEQ tablet Take 10 mEq by mouth daily. 08/12/14  Yes Historical Provider, MD  promethazine (PHENERGAN) 25 MG tablet Take 1 tablet (25 mg total) by mouth every 6 (six) hours as needed for nausea or vomiting. 01/23/15  Yes Margarita Mail, PA-C   sertraline (ZOLOFT) 25 MG tablet Take 50 mg by mouth daily.    Yes Historical Provider, MD  sodium bicarbonate 650 MG tablet Take 650 mg by mouth 3 (three) times daily. 02/25/16  Yes Historical Provider, MD  temazepam (RESTORIL) 15 MG capsule Take 15 mg by mouth at bedtime as needed for sleep.   Yes Historical Provider, MD  torsemide (DEMADEX) 20 MG tablet Take 2 tablets (40 mg total) by mouth daily. Patient taking differently: Take 80 mg by mouth daily as needed (fluid).  04/12/14  Yes Nishant Dhungel, MD  colchicine 0.6 MG tablet Take 0.6 mg by mouth every other day. 10/30/14 01/11/16  Historical Provider, MD    Physical Exam: Constitutional: Overweight female who appears to be in mild distress and discomfort. Vitals:   04/01/16 1756 04/01/16 1811 04/01/16 1900 04/01/16 1930  BP: (!) 189/91 (!) 179/85 (!) 180/99 (!) 170/84  Pulse: 70 65    Resp: $Remo'17 18 17 'XouIC$ (!) 21  Temp: 97.8 F (36.6 C) 98 F (36.7 C)    TempSrc: Oral Oral    SpO2: 100% 98%    Weight:      Height:       Eyes: PERRL, lids and conjunctivae normal ENMT: Mucous membranes are moist. Posterior pharynx clear of any exudate or lesions.Normal dentition.  Neck: normal, supple, no masses, no thyromegaly Respiratory: clear to auscultation bilaterally, no wheezing, no crackles. Normal respiratory effort. No accessory muscle use.  Cardiovascular: Regular rate and rhythm, no murmurs / rubs / gallops. No extremity edema. 2+ pedal pulses. No carotid bruits.  Abdomen: no tenderness, no masses palpated. No hepatosplenomegaly. Bowel sounds positive.  Musculoskeletal: no clubbing / cyanosis. No joint deformity upper and lower extremities. Good ROM, no contractures. Normal muscle tone.  Skin: no rashes, lesions, ulcers. No induration Neurologic: CN 2-12 grossly intact. Sensation intact, DTR normal. Strength 5/5 in all 4.  Psychiatric: Normal judgment and insight. Alert and oriented x 3. Normal mood.     Labs on Admission: I have  personally reviewed following labs and imaging studies  CBC:  Recent Labs Lab 04/01/16 1605  WBC 5.3  HGB 9.1*  HCT 28.5*  MCV 76.2*  PLT 315   Basic Metabolic Panel:  Recent Labs Lab 04/01/16 1605  NA 141  K 4.7  CL 113*  CO2 15*  GLUCOSE 90  BUN 40*  CREATININE 4.86*  CALCIUM 7.4*   GFR: Estimated Creatinine Clearance: 19.4 mL/min (A) (by C-G formula based on SCr of 4.86 mg/dL (H)). Liver Function Tests: No results for input(s): AST, ALT, ALKPHOS, BILITOT, PROT, ALBUMIN in the last 168 hours. No results for input(s): LIPASE, AMYLASE in the last 168 hours. No results for input(s): AMMONIA in the last 168 hours. Coagulation Profile: No results for input(s): INR, PROTIME in the last 168 hours. Cardiac Enzymes: No results for input(s): CKTOTAL, CKMB, CKMBINDEX, TROPONINI in the last 168 hours. BNP (last 3 results) No results for input(s): PROBNP in the last 8760 hours. HbA1C: No results for input(s): HGBA1C in the last 72 hours. CBG: No results for input(s): GLUCAP in the last 168 hours. Lipid Profile: No results for input(s): CHOL, HDL, LDLCALC, TRIG, CHOLHDL, LDLDIRECT in the last 72 hours. Thyroid Function Tests: No results for input(s): TSH, T4TOTAL, FREET4, T3FREE, THYROIDAB in the last 72 hours. Anemia Panel: No results for input(s): VITAMINB12, FOLATE, FERRITIN, TIBC, IRON, RETICCTPCT in the last 72 hours. Urine analysis:    Component Value Date/Time   COLORURINE YELLOW 01/31/2016 [place] 01/31/2016 1245   LABSPEC 1.014 01/31/2016 1245   PHURINE 6.0 01/31/2016 1245   GLUCOSEU 150 (A) 01/31/2016 1245   HGBUR NEGATIVE 01/31/2016 1245   [place] 01/31/2016 1245   KETONESUR NEGATIVE 01/31/2016 1245   PROTEINUR >=300 (A) 01/31/2016 1245   UROBILINOGEN 0.2 09/19/2014 1620   NITRITE NEGATIVE 01/31/2016 1245   LEUKOCYTESUR NEGATIVE 01/31/2016 1245   Sepsis Labs: No results found for this or any previous visit (from the past  240 hour(s)).   Radiological Exams on Admission: Dg Chest 2 View  Result Date: 04/01/2016 CLINICAL DATA:  Right-sided chest pain and shortness of breath for 1 day EXAM: CHEST  2 VIEW COMPARISON:  02/18/2016 FINDINGS: The heart size and mediastinal contours are within normal limits. Both lungs are clear. The visualized skeletal structures are unremarkable. IMPRESSION: No active cardiopulmonary disease. Electronically Signed   By: Inez Catalina  M.D.   On: 04/01/2016 16:20   Ct Head Wo Contrast  Result Date: 04/01/2016 CLINICAL DATA:  Elevated blood pressure and headaches. EXAM: CT HEAD WITHOUT CONTRAST TECHNIQUE: Contiguous axial images were obtained from the base of the skull through the vertex without intravenous contrast. COMPARISON:  MRI 01/18/2015 FINDINGS: Brain: The ventricles are normal in size and configuration. No extra-axial fluid collections are identified. The gray-white differentiation is normal. No CT findings for acute intracranial process such as hemorrhage or infarction. No mass lesions. The brainstem and cerebellum are grossly normal. Vascular: No hyperdense vessels or aneurysm. Minimal scattered vascular calcifications. Skull: No skull fracture or bone lesion. Sinuses/Orbits: The paranasal sinuses and mastoid air cells are clear. The globes are intact. Other: No scalp lesions. IMPRESSION: Normal head CT. Electronically Signed   By: Marijo Sanes M.D.   On: 04/01/2016 20:19    EKG: Independently reviewed. Sinus rhythm.  Assessment/Plan Chest pain: Acute. Patient reports intermittent right-sided chest pain radiating to back. Chest x-ray otherwise noted to be clear with no significant signs of widening of the mediastinum. Initial troponins negative. Patient also noted to have recent nuclear stress test that was noted to be negative with EF of 60% on 03/24/2016 in care everywhere. Heart scores =4. Suspect symptoms less likely cardiac in nature. - Admit to a telemetry bed - Chest pain  order set initiated - Trend cardiac enzymes x 3   - Continue aspirin - Follow-up telemetry overnight - Consider need of formal consult cardiology in a.m.  Headache: Acute. Could be secondary to elevated blood pressures. - Goal to obtain better control blood pressure   Nausea and vomiting: Acute. Patient reports having nausea symptoms since worsening kidney disease, but recently developed vomiting. Elevated BUN vs. gastroenteritis. 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 She is followed by Dr. Francis Dowse at [facility]. Question if patient's presenting symptoms are secondary to progression of disease although kidney function appears similar to one month ago. On physical exam patient does not appear to be full fluid overload at this time. - Continue sodium bicarbonate, Calcitrol, calcium acetate - Message left for nephrology to evaluate the patient in a.m.   - Will likely need to contact patient's nephrologist as well in a.m. - Advised patient to avoid NSAIDs  Essential hypertension, uncontrolled: Initial blood pressures elevated to 189/91. Patient was given 40 of labetalol while in the ED. - Continue metoprolol and lisinopril,  - Changed Torsemide from prn to scheduled  - Increased amlodipine 10mg  daily as patient's nephrologist had already advised her to for elevated blood pressures  - Hydralazine IV prn sbp >180 or dbp >110     Anxiety  - Continue Zoloft, Ativan prn anxiety   Anemia of chronic disease: Hemoglobin 9.1 on admission with low MCV and MCH. Appears patient was previously on ferrous sulfate. - Continue to monitor   History of diabetes mellitus type 2: Patient reports not being on any medications at this time and initial blood glucose noted to be 90. - Continue to monitor   Hyperlipidemia - Continue atorvastatin  GERD - continue pharmacy substitution of  protonix  DVT prophylaxis: Heparin  Code Status: Full Family Communication:   Disposition Plan: Likely discharge home Was medically stable Consults called: none Admission status:observation  Norval Morton MD [facility] Pager [phone]  If 7PM-7AM, please contact night-coverage [email or web address] Password [email or web address]  04/01/2016, 9:02 PM

## 2016-04-01 NOTE — ED Provider Notes (Signed)
Medical screening examination/treatment/procedure(s) were conducted as a shared visit with non-physician practitioner(s) and myself.  I personally evaluated the patient during the encounter.   EKG Interpretation  Date/Time:  Wednesday April 01 2016 15:51:43 EDT Ventricular Rate:  79 PR Interval:  146 QRS Duration: 82 QT Interval:  406 QTC Calculation: 465 R Axis:   70 Text Interpretation:  Normal sinus rhythm Cannot rule out Anterior infarct , age undetermined Abnormal ECG Confirmed by Shawndra Clute  MD, Kayleah Appleyard [phone]) on 04/01/2016 7:23:06 PM       Results for orders placed or performed during the hospital encounter of 67/89/38  Basic metabolic panel  Result Value Ref Range   Sodium 141 135 - 145 mmol/L   Potassium 4.7 3.5 - 5.1 mmol/L   Chloride 113 (H) 101 - 111 mmol/L   CO2 15 (L) 22 - 32 mmol/L   Glucose, Bld 90 65 - 99 mg/dL   BUN 40 (H) 6 - 20 mg/dL   Creatinine, Ser 4.86 (H) 0.44 - 1.00 mg/dL   Calcium 7.4 (L) 8.9 - 10.3 mg/dL   GFR calc non Af Amer 10 (L) >60 mL/min   GFR calc Af Amer 12 (L) >60 mL/min   Anion gap 13 5 - 15  CBC  Result Value Ref Range   WBC 5.3 4.0 - 10.5 K/uL   RBC 3.74 (L) 3.87 - 5.11 MIL/uL   Hemoglobin 9.1 (L) 12.0 - 15.0 g/dL   HCT 28.5 (L) 36.0 - 46.0 %   MCV 76.2 (L) 78.0 - 100.0 fL   MCH 24.3 (L) 26.0 - 34.0 pg   MCHC 31.9 30.0 - 36.0 g/dL   RDW 16.5 (H) 11.5 - 15.5 %   Platelets 325 150 - 400 K/uL  I-stat troponin, ED  Result Value Ref Range   Troponin i, poc 0.00 0.00 - 0.08 ng/mL   Comment 3           Dg Chest 2 View  Result Date: 04/01/2016 CLINICAL DATA:  Right-sided chest pain and shortness of breath for 1 day EXAM: CHEST  2 VIEW COMPARISON:  02/18/2016 FINDINGS: The heart size and mediastinal contours are within normal limits. Both lungs are clear. The visualized skeletal structures are unremarkable. IMPRESSION: No active cardiopulmonary disease. Electronically Signed   By: Inez Catalina M.D.   On: 04/01/2016 16:20   Ct Head Wo  Contrast  Result Date: 04/01/2016 CLINICAL DATA:  Elevated blood pressure and headaches. EXAM: CT HEAD WITHOUT CONTRAST TECHNIQUE: Contiguous axial images were obtained from the base of the skull through the vertex without intravenous contrast. COMPARISON:  MRI 01/18/2015 FINDINGS: Brain: The ventricles are normal in size and configuration. No extra-axial fluid collections are identified. The gray-white differentiation is normal. No CT findings for acute intracranial process such as hemorrhage or infarction. No mass lesions. The brainstem and cerebellum are grossly normal. Vascular: No hyperdense vessels or aneurysm. Minimal scattered vascular calcifications. Skull: No skull fracture or bone lesion. Sinuses/Orbits: The paranasal sinuses and mastoid air cells are clear. The globes are intact. Other: No scalp lesions. IMPRESSION: Normal head CT. Electronically Signed   By: Marijo Sanes M.D.   On: 04/01/2016 20:19    Patient known to have renal insufficiency not requiring dialysis yet. Follow[facility] nephrology at [facility].  Patient with 3 day history of headache blurred vision photophobia no neck stiffness no fevers. Intermittent chest pain not lasting more than 15 minutes. Generalized body weakness she feels is been that way due to her failing kidneys.  Patient was admitted of [facility] for 2 days for blood pressure control. Pressures were better when she was discharged now it's back up.  Plan is for patient be transferred to [place] [facility] here when she needs dialysis. Patient lives in this area. Patient's blood pressure here improves some with labetalol systolics down to 443. But still having headache despite pain medicine. Head CT negative. Troponin negative. Creatinine of 4.86. Potassium normal at 4.7.  Would recommend admission for hypertensive urgency and hypertension control. Patient starting yesterday at the advice of her kidney doctor increased her blood pressure meds but the blood pressure  still not being controlled properly. Patient is followed by no vomiting primary care in [facility]. Patient is a technically an unassigned admission.     Fredia Sorrow, MD 04/01/16 2050

## 2016-04-01 NOTE — ED Provider Notes (Signed)
[facility] DEPT Provider Note   CSN: [id number] Arrival date & time: 04/01/16  1543     History   Chief Complaint Chief Complaint  Patient presents with  . Chest Pain    HPI Patricia [name] is a 41 y.o. female with PMHx of GERD, CKD, HLD, HTN, DM type 2 Presenting to the ED complaining of chest pain since last night. She describes her chest pain on her right side, sharp, unchanged, intermittent, 4/10. She reports associated 3 or 4 episodes of emesis each after trying to eat. She reports radiation to her back "a little bit". She reports SOB "when I move around". She denies radiation to her arms or jaw. She denies numbness, tingling. She denies diaphoresis. She denies association to activity, exercise, food, or anything she can think of.  She states nothing she has tried has helped her pain. Nothing makes it better or worse. She reports associated headache for 3 days that has been colicky but constant, "whole head hurts", similar to previous headaches, worsening, pressure, 9/10. She has tried Excedrin with minimal to no relief. She reports associated blurry vision, photophobia, dizziness. She denies double vision and changes in gait.   She reports that she has mild nausea at baseline due to her CKD medication, which is unchanged. Patient states she had this replaced her left arm on 10/25/15 and has not started dialysis yet. Patient has family history of CAD. Patient is a nonsmoker. She states she has had similar chest pain in the past. She states she just had a stress test last week and "they didn't tell me anything so I guess it was good". She states she got in touch with her nephrologist last night due to her symptoms and was told to double her amlodipine. She states she did that and also took it this morning with no significant change in BP. She states her highest BP yesterday was 190/108.  She has appt with nephrologist in [place] tomorrow.  She reports having a cardiac catheterization about  15-20 years ago and states it was "clean". She states she was just seen at [facility] 2 weeks ago for hypertension and was admitted for it staying in hospital for 2 days. She states she was going to be started on dialysis but her nephrologist chose to wait a little longer.  Per EMR: Patient has been evaluated multiple times in ED for same presentation of symptoms which has been consistent with the same diagnosis of noncardiac chest pain. Latest echo 06/19/2015   LV EF% 99-24%  Systolic function normal.   The history is provided by the patient. No language interpreter was used.  Chest Pain   Associated symptoms include headaches, nausea and shortness of breath. Pertinent negatives include no abdominal pain, no fever, no numbness and no vomiting.    Past Medical History:  Diagnosis Date  . Acid reflux   . Chronic kidney disease (CKD), stage III (moderate)   . Headache(784.0)    "related to chemo; sometimes weekly" (09/12/2013)  . High cholesterol   . History of blood transfusion "a couple"   "related to low counts"  . Hypertension   . Iron deficiency anemia    "get epogen shots q month" (02/20/2014)  . 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 Type II diabetes mellitus ([place])    "took me off my RX ~ 04/2013"    Patient Active Problem List   Diagnosis Date Noted  .  Hypomagnesemia 04/28/2015  . Acute renal failure superimposed on stage 3 chronic kidney disease ([place]) 04/12/2014  . Atypical chest pain   . Chest pain 02/19/2014  . Hypokalemia 02/19/2014  . Nephrotic syndrome 10/02/2013  . Dyslipidemia 10/02/2013  . PAT (paroxysmal atrial tachycardia) ([place]) 09/29/2013  . CKD stage 3 followed at [facility] 09/12/2013  . Symptomatic anemia 06/29/2013  . Edema 06/29/2013  . Fever, unspecified 01/31/2013  . Glomerulonephritis 01/28/2013  . Acute renal failure ([place]) 01/28/2013  . Syncope 01/28/2013  . Chest pain with moderate risk of acute  coronary syndrome 07/04/2011  . Shortness of breath 07/04/2011  . Morbid obesity ([place]) 07/04/2011  . HTN (hypertension) 07/04/2011  . DM (diabetes mellitus) ([place]) 07/04/2011  . Hypercholesterolemia 07/04/2011    Past Surgical History:  Procedure Laterality Date  . ABDOMINAL HYSTERECTOMY  2010   "laparoscopic"  . ANKLE FRACTURE SURGERY Right 1994  . CARDIAC CATHETERIZATION  2000's  . FRACTURE SURGERY      OB History    Gravida Para Term Preterm AB Living   3             SAB TAB Ectopic Multiple Live Births         3         Home Medications    Prior to Admission medications   Medication Sig Start Date End Date Taking? Authorizing Provider  allopurinol (ZYLOPRIM) 100 MG tablet Take 100 mg by mouth daily.   Yes Historical Provider, MD  amLODipine (NORVASC) 5 MG tablet Take 5 mg by mouth daily. 08/12/14  Yes Historical Provider, MD  aspirin 81 MG chewable tablet Chew 1 tablet (81 mg total) by mouth daily. 10/03/13  Yes Eugenie Filler, MD  Aspirin-Acetaminophen-Caffeine (GOODY HEADACHE PO) Take 1 Package by mouth daily as needed (pain).   Yes Historical Provider, MD  atorvastatin (LIPITOR) 40 MG tablet Take 40 mg by mouth daily. 03/14/14  Yes Historical Provider, MD  calcitRIOL (ROCALTROL) 0.25 MCG capsule Take 0.25 mcg by mouth every Monday, Wednesday, and Friday. 02/05/16  Yes Historical Provider, MD  calcium acetate (PHOSLO) 667 MG capsule Take 1,334 mg by mouth See admin instructions. Usually only east once daily 02/24/16 02/23/17 Yes Historical Provider, MD  cholecalciferol (VITAMIN D) 400 units TABS tablet Take 400 Units by mouth daily.   Yes Historical Provider, MD  diphenhydramine-acetaminophen (TYLENOL PM) 25-500 MG TABS tablet Take 2 tablets by mouth at bedtime as needed (for sleep).    Yes Historical Provider, MD  ferrous sulfate 325 (65 FE) MG EC tablet Take 325 mg by mouth daily.   Yes Historical Provider, MD  lisinopril (PRINIVIL,ZESTRIL) 10 MG tablet Take 10 mg by mouth  daily.   Yes Historical Provider, MD  LORazepam (ATIVAN) 0.5 MG tablet Take 0.5 mg by mouth every 8 (eight) hours as needed for anxiety.  07/11/15  Yes Historical Provider, MD  magnesium oxide (MAG-OX) 400 MG tablet Take 1 tablet (400 mg total) by mouth daily. 04/29/15  Yes Venetia Maxon Rama, MD  metoprolol tartrate (LOPRESSOR) 25 MG tablet Take 1 tablet (25 mg total) by mouth 2 (two) times daily. 10/03/13  Yes Eugenie Filler, MD  Multiple Vitamin (MULTIVITAMIN WITH MINERALS) TABS tablet Take 1 tablet by mouth daily.   Yes Historical Provider, MD  omeprazole (PRILOSEC) 40 MG capsule Take 40 mg by mouth daily.     Yes Historical Provider, MD  potassium chloride (K-DUR) 10 MEQ tablet Take 10 mEq by mouth daily. 08/12/14  Yes Historical Provider,  MD  promethazine (PHENERGAN) 25 MG tablet Take 1 tablet (25 mg total) by mouth every 6 (six) hours as needed for nausea or vomiting. 01/23/15  Yes Margarita Mail, PA-C  sertraline (ZOLOFT) 25 MG tablet Take 50 mg by mouth daily.    Yes Historical Provider, MD  sodium bicarbonate 650 MG tablet Take 650 mg by mouth 3 (three) times daily. 02/25/16  Yes Historical Provider, MD  temazepam (RESTORIL) 15 MG capsule Take 15 mg by mouth at bedtime as needed for sleep.   Yes Historical Provider, MD  torsemide (DEMADEX) 20 MG tablet Take 2 tablets (40 mg total) by mouth daily. Patient taking differently: Take 80 mg by mouth daily as needed (fluid).  04/12/14  Yes Nishant Dhungel, MD  colchicine 0.6 MG tablet Take 0.6 mg by mouth every other day. 10/30/14 01/11/16  Historical Provider, MD    Family History Family History  Problem Relation Age of Onset  . Hypertension Mother   . Thyroid disease Mother   . Coronary artery disease Father   . Hypertension Father   . Diabetes Father     Social History Social History  Substance Use Topics  . Smoking status: Never Smoker  . Smokeless tobacco: Never Used  . Alcohol use No     Allergies   Nsaids and Tolmetin   Review  of Systems Review of Systems  Constitutional: Negative for chills and fever.  Eyes: Positive for photophobia.       Blurry vision  Respiratory: Positive for shortness of breath.   Cardiovascular: Positive for chest pain.  Gastrointestinal: Positive for nausea. Negative for abdominal pain and vomiting.  Genitourinary: Negative for difficulty urinating and dysuria.  Neurological: Positive for headaches. Negative for numbness.  All other systems reviewed and are negative.    Physical Exam Updated Vital Signs BP (!) 177/93   Pulse 65   Temp 98 F (36.7 C) (Oral)   Resp 19   Ht $R'5\' 7"'Ch$  (1.702 m)   Wt 111.1 kg   SpO2 99%   BMI 38.37 kg/m   Physical Exam  Constitutional: She is oriented to person, place, and time. She appears well-developed and well-nourished.  Well appearing  HENT:  Head: Normocephalic and atraumatic.  Nose: Nose normal.  Mouth/Throat: Oropharynx is clear and moist.  Eyes: Conjunctivae and EOM are normal. Pupils are equal, round, and reactive to light.  Neck: Normal range of motion. Neck supple. No tracheal deviation present.  No carotid bruits noted.   Cardiovascular: Normal rate, normal heart sounds and intact distal pulses.   No murmur heard. Pulmonary/Chest: Effort normal and breath sounds normal. No respiratory distress. She has no wheezes. She has no rales.  Normal work of breathing. No respiratory distress noted.   Abdominal: Soft. Bowel sounds are normal. There is no tenderness. There is no rebound and no guarding.  Musculoskeletal: Normal range of motion.  Chest pain non reproducable   Neurological: She is alert and oriented to person, place, and time.  Cranial Nerves:  III,IV, VI: ptosis not present, extra-ocular movements intact bilaterally, direct and consensual pupillary light reflexes intact bilaterally V: facial sensation, jaw opening, and bite strength equal bilaterally VII: eyebrow raise, eyelid close, smile, frown, pucker equal  bilaterally VIII: hearing grossly normal bilaterally  IX,X: palate elevation and swallowing intact XI: bilateral shoulder shrug and lateral head rotation equal and strong XII: midline tongue extension  Negative pronator drift, negative Romberg, negative RAM's, negative heel-to-shin, negative finger to nose.    Sensory intact.  Muscle strength 5/5 Patient able to ambulate without difficulty.   Skin: Skin is warm. Capillary refill takes less than 2 seconds.  Psychiatric: She has a normal mood and affect. Her behavior is normal.  Nursing note and vitals reviewed.    ED Treatments / Results  Labs (all labs ordered are listed, but only abnormal results are displayed) Labs Reviewed  BASIC METABOLIC PANEL - Abnormal; Notable for the following:       Result Value   Chloride 113 (*)    CO2 15 (*)    BUN 40 (*)    Creatinine, Ser 4.86 (*)    Calcium 7.4 (*)    GFR calc non Af Amer 10 (*)    GFR calc Af Amer 12 (*)    All other components within normal limits  CBC - Abnormal; Notable for the following:    RBC 3.74 (*)    Hemoglobin 9.1 (*)    HCT 28.5 (*)    MCV 76.2 (*)    MCH 24.3 (*)    RDW 16.5 (*)    All other components within normal limits  I-STAT TROPOININ, ED    EKG  EKG Interpretation  Date/Time:  Wednesday April 01 2016 15:51:43 EDT Ventricular Rate:  79 PR Interval:  146 QRS Duration: 82 QT Interval:  406 QTC Calculation: 465 R Axis:   70 Text Interpretation:  Normal sinus rhythm Cannot rule out Anterior infarct , age undetermined Abnormal ECG Confirmed by Rogene Houston  MD, SCOTT [phone]) on 04/01/2016 7:23:06 PM       Radiology Dg Chest 2 View  Result Date: 04/01/2016 CLINICAL DATA:  Right-sided chest pain and shortness of breath for 1 day EXAM: CHEST  2 VIEW COMPARISON:  02/18/2016 FINDINGS: The heart size and mediastinal contours are within normal limits. Both lungs are clear. The visualized skeletal structures are unremarkable. IMPRESSION: No active  cardiopulmonary disease. Electronically Signed   By: Inez Catalina M.D.   On: 04/01/2016 16:20   Ct Head Wo Contrast  Result Date: 04/01/2016 CLINICAL DATA:  Elevated blood pressure and headaches. EXAM: CT HEAD WITHOUT CONTRAST TECHNIQUE: Contiguous axial images were obtained from the base of the skull through the vertex without intravenous contrast. COMPARISON:  MRI 01/18/2015 FINDINGS: Brain: The ventricles are normal in size and configuration. No extra-axial fluid collections are identified. The gray-white differentiation is normal. No CT findings for acute intracranial process such as hemorrhage or infarction. No mass lesions. The brainstem and cerebellum are grossly normal. Vascular: No hyperdense vessels or aneurysm. Minimal scattered vascular calcifications. Skull: No skull fracture or bone lesion. Sinuses/Orbits: The paranasal sinuses and mastoid air cells are clear. The globes are intact. Other: No scalp lesions. IMPRESSION: Normal head CT. Electronically Signed   By: Marijo Sanes M.D.   On: 04/01/2016 20:19    Procedures Procedures (including critical care time)  Medications Ordered in ED Medications  morphine 4 MG/ML injection 4 mg (not administered)  labetalol (NORMODYNE,TRANDATE) injection 20 mg (not administered)  labetalol (NORMODYNE,TRANDATE) injection 20 mg (20 mg Intravenous Given 04/01/16 1953)  morphine 4 MG/ML injection 4 mg (4 mg Intravenous Given 04/01/16 1953)  ondansetron (ZOFRAN) injection 4 mg (4 mg Intravenous Given 04/01/16 2014)     Initial Impression / Assessment and Plan / ED Course  I have reviewed the triage vital signs and the nursing notes.  Pertinent labs & imaging results that were available during my care of the patient were reviewed by me and considered in my medical decision making (  see chart for details).    Patient here with likely hypertension urgency vs emergency. She has had intermittent chest pain since yesterday, blurry vision, headache and  uncontrolled hypertension. She is hypertensive here. She is in no apparent distress, afebrile. Heart and lung sounds are clear. Abdomen soft and nontender. She is neurologically intact. She has good coordination. Muscle strength good. Sensation intact. Able to stand and ambulate without difficulty. Patient CT is negative. Troponin negative. Lab work is similar to previous. Cr at 4.86, slightly lower than previous finding. Chest x-ray negative. Patient given labetalol here. Patient also given Zofran and morphine for pain. Patient also seen and evaluated by Dr. Rogene Houston who agrees with assessment and plan.   21:08 I spoke with Dr. Tamala Julian who agreed to have patient admitted.   Final Clinical Impressions(s) / ED Diagnoses   Final diagnoses:  Essential hypertension    New Prescriptions New Prescriptions   No medications on file     [place], [place] 04/01/16 2339    Fredia Sorrow, MD 04/03/16 [phone]

## 2016-04-01 NOTE — ED Notes (Signed)
Patient transported to CT 

## 2016-04-01 NOTE — ED Triage Notes (Signed)
Pt here for generalized CP and HA; pt sts noted to be hypertensive

## 2016-04-01 NOTE — ED Notes (Signed)
Pt reporting new onset dizziness and worsening headache.

## 2016-04-01 NOTE — ED Notes (Signed)
Admitting Provider at bedside. 

## 2016-04-01 NOTE — ED Notes (Signed)
Per EDP pt is able to have something to drink.

## 2016-04-02 DIAGNOSIS — N185 Chronic kidney disease, stage 5: Secondary | ICD-10-CM | POA: Diagnosis not present

## 2016-04-02 DIAGNOSIS — Z992 Dependence on renal dialysis: Secondary | ICD-10-CM | POA: Diagnosis not present

## 2016-04-02 DIAGNOSIS — I10 Essential (primary) hypertension: Secondary | ICD-10-CM | POA: Diagnosis not present

## 2016-04-02 DIAGNOSIS — E7800 Pure hypercholesterolemia, unspecified: Secondary | ICD-10-CM | POA: Diagnosis present

## 2016-04-02 DIAGNOSIS — Z886 Allergy status to analgesic agent status: Secondary | ICD-10-CM | POA: Diagnosis not present

## 2016-04-02 DIAGNOSIS — Z8349 Family history of other endocrine, nutritional and metabolic diseases: Secondary | ICD-10-CM | POA: Diagnosis not present

## 2016-04-02 DIAGNOSIS — E785 Hyperlipidemia, unspecified: Secondary | ICD-10-CM | POA: Diagnosis present

## 2016-04-02 DIAGNOSIS — T82898A Other specified complication of vascular prosthetic devices, implants and grafts, initial encounter: Secondary | ICD-10-CM | POA: Diagnosis not present

## 2016-04-02 DIAGNOSIS — E872 Acidosis: Secondary | ICD-10-CM | POA: Diagnosis present

## 2016-04-02 DIAGNOSIS — I160 Hypertensive urgency: Secondary | ICD-10-CM | POA: Diagnosis present

## 2016-04-02 DIAGNOSIS — N2581 Secondary hyperparathyroidism of renal origin: Secondary | ICD-10-CM | POA: Diagnosis present

## 2016-04-02 DIAGNOSIS — K5900 Constipation, unspecified: Secondary | ICD-10-CM | POA: Diagnosis not present

## 2016-04-02 DIAGNOSIS — R112 Nausea with vomiting, unspecified: Secondary | ICD-10-CM | POA: Diagnosis present

## 2016-04-02 DIAGNOSIS — E1122 Type 2 diabetes mellitus with diabetic chronic kidney disease: Secondary | ICD-10-CM | POA: Diagnosis present

## 2016-04-02 DIAGNOSIS — E669 Obesity, unspecified: Secondary | ICD-10-CM | POA: Diagnosis present

## 2016-04-02 DIAGNOSIS — Z79899 Other long term (current) drug therapy: Secondary | ICD-10-CM | POA: Diagnosis not present

## 2016-04-02 DIAGNOSIS — K219 Gastro-esophageal reflux disease without esophagitis: Secondary | ICD-10-CM | POA: Diagnosis present

## 2016-04-02 DIAGNOSIS — E8770 Fluid overload, unspecified: Secondary | ICD-10-CM | POA: Diagnosis present

## 2016-04-02 DIAGNOSIS — I120 Hypertensive chronic kidney disease with stage 5 chronic kidney disease or end stage renal disease: Secondary | ICD-10-CM | POA: Diagnosis present

## 2016-04-02 DIAGNOSIS — D631 Anemia in chronic kidney disease: Secondary | ICD-10-CM | POA: Diagnosis present

## 2016-04-02 DIAGNOSIS — N184 Chronic kidney disease, stage 4 (severe): Secondary | ICD-10-CM | POA: Diagnosis not present

## 2016-04-02 DIAGNOSIS — Z7982 Long term (current) use of aspirin: Secondary | ICD-10-CM | POA: Diagnosis not present

## 2016-04-02 DIAGNOSIS — R079 Chest pain, unspecified: Secondary | ICD-10-CM | POA: Diagnosis present

## 2016-04-02 DIAGNOSIS — F419 Anxiety disorder, unspecified: Secondary | ICD-10-CM | POA: Diagnosis present

## 2016-04-02 DIAGNOSIS — Z8249 Family history of ischemic heart disease and other diseases of the circulatory system: Secondary | ICD-10-CM | POA: Diagnosis not present

## 2016-04-02 DIAGNOSIS — I4581 Long QT syndrome: Secondary | ICD-10-CM | POA: Diagnosis present

## 2016-04-02 DIAGNOSIS — Z833 Family history of diabetes mellitus: Secondary | ICD-10-CM | POA: Diagnosis not present

## 2016-04-02 DIAGNOSIS — R000 Tachycardia, unspecified: Secondary | ICD-10-CM | POA: Diagnosis present

## 2016-04-02 DIAGNOSIS — N186 End stage renal disease: Secondary | ICD-10-CM | POA: Diagnosis present

## 2016-04-02 DIAGNOSIS — N189 Chronic kidney disease, unspecified: Secondary | ICD-10-CM | POA: Diagnosis not present

## 2016-04-02 LAB — CBC WITH DIFFERENTIAL/PLATELET
BASOS ABS: 0 10*3/uL (ref 0.0–0.1)
BASOS PCT: 1 %
Eosinophils Absolute: 0.2 10*3/uL (ref 0.0–0.7)
Eosinophils Relative: 3 %
HEMATOCRIT: 25.9 % — AB (ref 36.0–46.0)
Hemoglobin: 8.2 g/dL — ABNORMAL LOW (ref 12.0–15.0)
Lymphocytes Relative: 41 %
Lymphs Abs: 2.3 10*3/uL (ref 0.7–4.0)
MCH: 24 pg — ABNORMAL LOW (ref 26.0–34.0)
MCHC: 31.7 g/dL (ref 30.0–36.0)
MCV: 76 fL — ABNORMAL LOW (ref 78.0–100.0)
MONO ABS: 0.4 10*3/uL (ref 0.1–1.0)
Monocytes Relative: 7 %
NEUTROS ABS: 2.8 10*3/uL (ref 1.7–7.7)
NEUTROS PCT: 48 %
PLATELETS: 286 10*3/uL (ref 150–400)
RBC: 3.41 MIL/uL — ABNORMAL LOW (ref 3.87–5.11)
RDW: 16.7 % — AB (ref 11.5–15.5)
WBC: 5.8 10*3/uL (ref 4.0–10.5)

## 2016-04-02 LAB — COMPREHENSIVE METABOLIC PANEL
ALBUMIN: 2 g/dL — AB (ref 3.5–5.0)
ALT: 10 U/L — ABNORMAL LOW (ref 14–54)
AST: 12 U/L — AB (ref 15–41)
Alkaline Phosphatase: 33 U/L — ABNORMAL LOW (ref 38–126)
Anion gap: 9 (ref 5–15)
BILIRUBIN TOTAL: 0.3 mg/dL (ref 0.3–1.2)
BUN: 37 mg/dL — AB (ref 6–20)
CHLORIDE: 111 mmol/L (ref 101–111)
CO2: 17 mmol/L — AB (ref 22–32)
CREATININE: 4.75 mg/dL — AB (ref 0.44–1.00)
Calcium: 7.1 mg/dL — ABNORMAL LOW (ref 8.9–10.3)
GFR calc Af Amer: 12 mL/min — ABNORMAL LOW (ref >60)
GFR calc non Af Amer: 10 mL/min — ABNORMAL LOW (ref >60)
GLUCOSE: 104 mg/dL — AB (ref 65–99)
Potassium: 4 mmol/L (ref 3.5–5.1)
Sodium: 137 mmol/L (ref 135–145)
Total Protein: 5 g/dL — ABNORMAL LOW (ref 6.5–8.1)

## 2016-04-02 LAB — MAGNESIUM: Magnesium: 1.2 mg/dL — ABNORMAL LOW (ref 1.7–2.4)

## 2016-04-02 LAB — TROPONIN I
Troponin I: <0.03 ng/mL (ref <0.03)
Troponin I: <0.03 ng/mL (ref <0.03)

## 2016-04-02 MED ORDER — METOPROLOL TARTRATE 25 MG PO TABS
25.0000 mg | ORAL_TABLET | Freq: Once | ORAL | Status: AC
  Administered 2016-04-02: 25 mg via ORAL
  Filled 2016-04-02: qty 1

## 2016-04-02 MED ORDER — METOPROLOL TARTRATE 50 MG PO TABS
50.0000 mg | ORAL_TABLET | Freq: Two times a day (BID) | ORAL | Status: DC
  Administered 2016-04-02 – 2016-04-03 (×2): 50 mg via ORAL
  Filled 2016-04-02 (×2): qty 1

## 2016-04-02 MED ORDER — MAGNESIUM SULFATE 2 GM/50ML IV SOLN
2.0000 g | Freq: Once | INTRAVENOUS | Status: AC
  Administered 2016-04-02: 2 g via INTRAVENOUS
  Filled 2016-04-02: qty 50

## 2016-04-02 NOTE — Plan of Care (Signed)
Problem: Pain Managment: Goal: General experience of comfort will improve Outcome: Progressing Pt with c/o of a HA that will not go away even with pain medicine. Pt able to tolerate it at this time. Efforts to decrease BP have been effective. Will continue to monitor.

## 2016-04-02 NOTE — Progress Notes (Signed)
PROGRESS NOTE    Patricia [name]  ZOX:[id number] DOB: Apr 20, 1975 DOA: 04/01/2016 PCP: [facility]   Outpatient Specialists:     Brief Narrative:  Patricia [name] is a 41 y.o. 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 Patient notes headache symptoms started on 3 days ago. Headache is global and patient reports felt like a "hard" pain. Complains of sensitivity to light. Tried using BC Goody powder and Tylenol without relief of symptoms. Then yesterday night c/o of intermittent right-sided chest pain that radiated to her back. During this time was blood pressure was elevated as high as 190/108. Her nephrologist's take a additional amlodipine 5mg  which she did without complete relief of symptoms. Associated complaints include nausea, vomiting for at least the last 2 days, decreased appetite, diaphoresis, blurry vision, and shortness of breath with exertion. Denies any palpitations, fever, loss of consciousness, diarrhea, constipation, rash, or focal weakness. She notes that some of these symptoms have been present since her kidney function started to decline. Her nephrologist is Dr. Francis Dowse at [facility]. Patient already has a left-sided fistula in place that is matured. She still makes a small amount of urine at this time. The last few months she's had at least 2 admissions for issues with chest pain and renal failure, but still has not been started on dialysis yet. She just recently had a nuclear stress tests 8 days ago at [facility] that was noted to be negative for any acute signs of ischemia.   Assessment & Plan:   Principal Problem:   Chest pain Active Problems:   HTN (hypertension)   DM (diabetes mellitus) (HCC)   Hypercholesterolemia   Nausea and vomiting   ESRD (end stage renal disease) (HCC)   Anemia due to chronic kidney disease   GERD (gastroesophageal  reflux disease)   HTN urgency -slowly lower BP -increased norvasc and metoprolol  DM SSI  ESRD -not on HD -no indications to start dialysis at this point after speaking with Dr. Lorrene Reid-- need to control BP closer -follows at [facility]  N/V -slowly improving -ate 100% and then 20% (asked nurse about 100%-- not sure that is correct) -given phenergan  hypomagnesium -IV Mg 2 grams    DVT prophylaxis:  SQ Heparin  Code Status: Full Code   Family Communication:   Disposition Plan:        Subjective: Still with HA and nausea    Objective: Vitals:   04/02/16 0820 04/02/16 0915 04/02/16 1020 04/02/16 1318  BP: (!) 152/76 (!) 189/103 (!) 163/88 (!) 164/91  Pulse:  69  72  Resp: 14 15 12 15   Temp:  97.6 F (36.4 C)  98.2 F (36.8 C)  TempSrc:  Oral  Oral  SpO2: 98% 98%  98%  Weight:      Height:        Intake/Output Summary (Last 24 hours) at 04/02/16 1449 Last data filed at 04/02/16 1300  Gross per 24 hour  Intake              720 ml  Output                0 ml  Net              720 ml   Filed Weights   04/01/16 1546 04/01/16 2321 04/02/16 0511  Weight: 111.1 kg (245 lb) 112.3 kg (247  lb 8 oz) 112.6 kg (248 lb 3.2 oz)    Examination:  General exam: appears uncomfortable Respiratory system: Clear to auscultation. Respiratory effort normal. Cardiovascular system: S1 & S2 heard, RRR. No JVD, murmurs, rubs, gallops or clicks. No pedal edema. Gastrointestinal system: Abdomen is nondistended, soft and nontender. No organomegaly or masses felt. Normal bowel sounds heard. Central nervous system: Alert and oriented. No focal neurological deficits.    Data Reviewed: I have personally reviewed following labs and imaging studies  CBC:  Recent Labs Lab 04/01/16 1605 04/02/16 0413  WBC 5.3 5.8  NEUTROABS  --  2.8  HGB 9.1* 8.2*  HCT 28.5* 25.9*  MCV 76.2* 76.0*  PLT 325 176   Basic Metabolic Panel:  Recent Labs Lab 04/01/16 1605 04/02/16 0413    NA 141 137  K 4.7 4.0  CL 113* 111  CO2 15* 17*  GLUCOSE 90 104*  BUN 40* 37*  CREATININE 4.86* 4.75*  CALCIUM 7.4* 7.1*  MG  --  1.2*   GFR: Estimated Creatinine Clearance: 20 mL/min (A) (by C-G formula based on SCr of 4.75 mg/dL (H)). Liver Function Tests:  Recent Labs Lab 04/02/16 0413  AST 12*  ALT 10*  ALKPHOS 33*  BILITOT 0.3  PROT 5.0*  ALBUMIN 2.0*   No results for input(s): LIPASE, AMYLASE in the last 168 hours. No results for input(s): AMMONIA in the last 168 hours. Coagulation Profile: No results for input(s): INR, PROTIME in the last 168 hours. Cardiac Enzymes:  Recent Labs Lab 04/01/16 2253 04/02/16 0413 04/02/16 0933  TROPONINI <0.03 <0.03 <0.03   BNP (last 3 results) No results for input(s): PROBNP in the last 8760 hours. HbA1C: No results for input(s): HGBA1C in the last 72 hours. CBG: No results for input(s): GLUCAP in the last 168 hours. Lipid Profile: No results for input(s): CHOL, HDL, LDLCALC, TRIG, CHOLHDL, LDLDIRECT in the last 72 hours. Thyroid Function Tests: No results for input(s): TSH, T4TOTAL, FREET4, T3FREE, THYROIDAB in the last 72 hours. Anemia Panel: No results for input(s): VITAMINB12, FOLATE, FERRITIN, TIBC, IRON, RETICCTPCT in the last 72 hours. Urine analysis:    Component Value Date/Time   COLORURINE YELLOW 01/31/2016 [place] 01/31/2016 1245   LABSPEC 1.014 01/31/2016 1245   PHURINE 6.0 01/31/2016 1245   GLUCOSEU 150 (A) 01/31/2016 1245   HGBUR NEGATIVE 01/31/2016 1245   [place] 01/31/2016 1245   [name] 01/31/2016 1245   PROTEINUR >=300 (A) 01/31/2016 1245   UROBILINOGEN 0.2 09/19/2014 1620   NITRITE NEGATIVE 01/31/2016 1245   LEUKOCYTESUR NEGATIVE 01/31/2016 1245    )No results found for this or any previous visit (from the past 240 hour(s)).    Anti-infectives    None       Radiology Studies: Dg Chest 2 View  Result Date: 04/01/2016 CLINICAL DATA:   Right-sided chest pain and shortness of breath for 1 day EXAM: CHEST  2 VIEW COMPARISON:  02/18/2016 FINDINGS: The heart size and mediastinal contours are within normal limits. Both lungs are clear. The visualized skeletal structures are unremarkable. IMPRESSION: No active cardiopulmonary disease. Electronically Signed   By: Inez Catalina M.D.   On: 04/01/2016 16:20   Ct Head Wo Contrast  Result Date: 04/01/2016 CLINICAL DATA:  Elevated blood pressure and headaches. EXAM: CT HEAD WITHOUT CONTRAST TECHNIQUE: Contiguous axial images were obtained from the base of the skull through the vertex without intravenous contrast. COMPARISON:  MRI 01/18/2015 FINDINGS: Brain: The ventricles are normal in size and  configuration. No extra-axial fluid collections are identified. The gray-white differentiation is normal. No CT findings for acute intracranial process such as hemorrhage or infarction. No mass lesions. The brainstem and cerebellum are grossly normal. Vascular: No hyperdense vessels or aneurysm. Minimal scattered vascular calcifications. Skull: No skull fracture or bone lesion. Sinuses/Orbits: The paranasal sinuses and mastoid air cells are clear. The globes are intact. Other: No scalp lesions. IMPRESSION: Normal head CT. Electronically Signed   By: Marijo Sanes M.D.   On: 04/01/2016 20:19        Scheduled Meds: . allopurinol  100 mg Oral Daily  . amLODipine  10 mg Oral Daily  . aspirin  81 mg Oral Daily  . atorvastatin  40 mg Oral Daily  . [START ON 04/03/2016] calcitRIOL  0.25 mcg Oral Q M,W,F  . calcium acetate  1,334 mg Oral TID WC  . ferrous sulfate  325 mg Oral Q breakfast  . heparin  5,000 Units Subcutaneous Q8H  . lisinopril  10 mg Oral Daily  . magnesium oxide  400 mg Oral Daily  . metoprolol tartrate  50 mg Oral BID  . multivitamin with minerals  1 tablet Oral Daily  . pantoprazole  40 mg Oral Daily  . sertraline  50 mg Oral Daily  . sodium bicarbonate  650 mg Oral TID  . torsemide   40 mg Oral Daily   Continuous Infusions:   LOS: 0 days    Time spent: 25 min    [place], DO [facility] Pager [phone]  If 7PM-7AM, please contact night-coverage [email or web address] Password [facility] 04/02/2016, 2:49 PM

## 2016-04-02 NOTE — Care Management Obs Status (Signed)
[place] NOTIFICATION   Patient Details  Name: Cicley Ganesh MRN: [id number] Date of Birth: 1975/08/24   Medicare Observation Status Notification Given:  Yes    Bethena Roys, RN 04/02/2016, 3:08 PM

## 2016-04-03 ENCOUNTER — Inpatient Hospital Stay (HOSPITAL_COMMUNITY): Payer: Medicare Other

## 2016-04-03 LAB — CBC
HCT: 27.2 % — ABNORMAL LOW (ref 36.0–46.0)
Hemoglobin: 8.6 g/dL — ABNORMAL LOW (ref 12.0–15.0)
MCH: 24.2 pg — ABNORMAL LOW (ref 26.0–34.0)
MCHC: 31.6 g/dL (ref 30.0–36.0)
MCV: 76.4 fL — ABNORMAL LOW (ref 78.0–100.0)
PLATELETS: 326 10*3/uL (ref 150–400)
RBC: 3.56 MIL/uL — AB (ref 3.87–5.11)
RDW: 17.1 % — ABNORMAL HIGH (ref 11.5–15.5)
WBC: 6.1 10*3/uL (ref 4.0–10.5)

## 2016-04-03 LAB — URINALYSIS, COMPLETE (UACMP) WITH MICROSCOPIC
Bilirubin Urine: NEGATIVE
Glucose, UA: 150 mg/dL — AB
Hgb urine dipstick: NEGATIVE
KETONES UR: NEGATIVE mg/dL
Leukocytes, UA: NEGATIVE
Nitrite: NEGATIVE
PH: 6 (ref 5.0–8.0)
Protein, ur: >=300 mg/dL — AB
SPECIFIC GRAVITY, URINE: 1.012 (ref 1.005–1.030)

## 2016-04-03 LAB — BASIC METABOLIC PANEL
Anion gap: 11 (ref 5–15)
BUN: 32 mg/dL — AB (ref 6–20)
CALCIUM: 7.8 mg/dL — AB (ref 8.9–10.3)
CO2: 18 mmol/L — ABNORMAL LOW (ref 22–32)
CREATININE: 4.86 mg/dL — AB (ref 0.44–1.00)
Chloride: 110 mmol/L (ref 101–111)
GFR calc Af Amer: 12 mL/min — ABNORMAL LOW (ref >60)
GFR, EST NON AFRICAN AMERICAN: 10 mL/min — AB (ref >60)
Glucose, Bld: 90 mg/dL (ref 65–99)
Potassium: 4.2 mmol/L (ref 3.5–5.1)
SODIUM: 139 mmol/L (ref 135–145)

## 2016-04-03 MED ORDER — LABETALOL HCL 200 MG PO TABS
200.0000 mg | ORAL_TABLET | Freq: Two times a day (BID) | ORAL | Status: DC
  Administered 2016-04-03 – 2016-04-07 (×9): 200 mg via ORAL
  Filled 2016-04-03 (×9): qty 1

## 2016-04-03 MED ORDER — FUROSEMIDE 10 MG/ML IJ SOLN: 80.0000 mg | Freq: Two times a day (BID) | INTRAMUSCULAR | Status: DC

## 2016-04-03 MED ORDER — FUROSEMIDE 10 MG/ML IJ SOLN
60.0000 mg | Freq: Two times a day (BID) | INTRAMUSCULAR | Status: DC
  Administered 2016-04-03 – 2016-04-04 (×3): 60 mg via INTRAVENOUS
  Filled 2016-04-03 (×3): qty 6

## 2016-04-03 NOTE — Progress Notes (Signed)
PROGRESS NOTE    Patricia [name]  SNK:[id number] DOB: [birth year] DOA: 04/01/2016 PCP: Hillis Range   Outpatient Specialists:    Subjective: Still complaining about nausea and vomiting, poor appetite. Blood pressure still elevated but improved since yesterday.   Brief Narrative:  Patricia [name] is a 41 y.o. 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 Patient notes headache symptoms started on 3 days ago. Headache is global and patient reports felt like a "hard" pain. Complains of sensitivity to light. Tried using BC Goody powder and Tylenol without relief of symptoms. Then yesterday night c/o of intermittent right-sided chest pain that radiated to her back. During this time was blood pressure was elevated as high as 190/108. Her nephrologist's take a additional amlodipine 5mg  which she did without complete relief of symptoms. Associated complaints include nausea, vomiting for at least the last 2 days, decreased appetite, diaphoresis, blurry vision, and shortness of breath with exertion. Denies any palpitations, fever, loss of consciousness, diarrhea, constipation, rash, or focal weakness. She notes that some of these symptoms have been present since her kidney function started to decline. Her nephrologist is Dr. Francis Dowse at [facility]. Patient already has a left-sided fistula in place that is matured. She still makes a small amount of urine at this time. The last few months she's had at least 2 admissions for issues with chest pain and renal failure, but still has not been started on dialysis yet. She just recently had a nuclear stress tests 8 days ago at [facility] that was noted to be negative for any acute signs of ischemia.   Assessment & Plan:   Principal Problem:   Chest pain Active Problems:   HTN (hypertension)   DM (diabetes mellitus) (HCC)  Hypercholesterolemia   Nausea and vomiting   ESRD (end stage renal disease) (HCC)   Anemia due to chronic kidney disease   GERD (gastroesophageal reflux disease)   HTN urgency -Isn't with blood pressure of 180/101, patient reports she had nausea and vomiting probably was vomiting her BP meds -On metoprolol and Norvasc, increased. -Blood pressure improved, part of it is likely fully and give extra dose of Lasix today.  DM -Appears to be very controlled with diabetic diet. -Check hemoglobin A1c  ESRD -Secondary to minimal change disease, not on HD, but had her fistula ready for dialysis in the left upper extremity. -no indications to start dialysis at this point after speaking with Dr. Lorrene Reid-- need to control BP closer -follows at [facility]  N/V -Treated symptomatically, likely this is what caused hypertension as she was vomiting her pressure medications. -No loss of consciousness or continuous headache to suggest stress.  Hypomagnesemia -Please with IV supplements, recheck magnesium in a.m.    DVT prophylaxis:  SQ Heparin Code Status: Full Code Family Communication: Disposition Plan:      Objective: Vitals:   04/02/16 2026 04/02/16 2232 04/03/16 0600 04/03/16 0845  BP: (!) 168/90  (!) 168/92 (!) 158/96  Pulse: 63 66    Resp: 14     Temp: 97.5 F (36.4 C)  98.2 F (36.8 C)   TempSrc: Oral     SpO2: 100%  98%   Weight:      Height:        Intake/Output Summary (Last 24 hours) at 04/03/16 1203 Last data filed at 04/02/16 1700  Gross per 24 hour  Intake  530 ml  Output                0 ml  Net              530 ml   Filed Weights   04/01/16 1546 04/01/16 2321 04/02/16 0511  Weight: 111.1 kg (245 lb) 112.3 kg (247 lb 8 oz) 112.6 kg (248 lb 3.2 oz)    Examination:  General exam: appears uncomfortable Respiratory system: Clear to auscultation. Respiratory effort normal. Cardiovascular system: S1 & S2 heard, RRR. No JVD, murmurs, rubs, gallops or  clicks. No pedal edema. Gastrointestinal system: Abdomen is nondistended, soft and nontender. No organomegaly or masses felt. Normal bowel sounds heard. Central nervous system: Alert and oriented. No focal neurological deficits.    Data Reviewed: I have personally reviewed following labs and imaging studies  CBC:  Recent Labs Lab 04/01/16 1605 04/02/16 0413 04/03/16 0417  WBC 5.3 5.8 6.1  NEUTROABS  --  2.8  --   HGB 9.1* 8.2* 8.6*  HCT 28.5* 25.9* 27.2*  MCV 76.2* 76.0* 76.4*  PLT 325 286 401   Basic Metabolic Panel:  Recent Labs Lab 04/01/16 1605 04/02/16 0413 04/03/16 0417  NA 141 137 139  K 4.7 4.0 4.2  CL 113* 111 110  CO2 15* 17* 18*  GLUCOSE 90 104* 90  BUN 40* 37* 32*  CREATININE 4.86* 4.75* 4.86*  CALCIUM 7.4* 7.1* 7.8*  MG  --  1.2*  --    GFR: Estimated Creatinine Clearance: 19.5 mL/min (A) (by C-G formula based on SCr of 4.86 mg/dL (H)). Liver Function Tests:  Recent Labs Lab 04/02/16 0413  AST 12*  ALT 10*  ALKPHOS 33*  BILITOT 0.3  PROT 5.0*  ALBUMIN 2.0*   No results for input(s): LIPASE, AMYLASE in the last 168 hours. No results for input(s): AMMONIA in the last 168 hours. Coagulation Profile: No results for input(s): INR, PROTIME in the last 168 hours. Cardiac Enzymes:  Recent Labs Lab 04/01/16 2253 04/02/16 0413 04/02/16 0933  TROPONINI <0.03 <0.03 <0.03   BNP (last 3 results) No results for input(s): PROBNP in the last 8760 hours. HbA1C: No results for input(s): HGBA1C in the last 72 hours. CBG: No results for input(s): GLUCAP in the last 168 hours. Lipid Profile: No results for input(s): CHOL, HDL, LDLCALC, TRIG, CHOLHDL, LDLDIRECT in the last 72 hours. Thyroid Function Tests: No results for input(s): TSH, T4TOTAL, FREET4, T3FREE, THYROIDAB in the last 72 hours. Anemia Panel: No results for input(s): VITAMINB12, FOLATE, FERRITIN, TIBC, IRON, RETICCTPCT in the last 72 hours. Urine analysis:    Component Value Date/Time    COLORURINE YELLOW 01/31/2016 [place] 01/31/2016 1245   LABSPEC 1.014 01/31/2016 1245   PHURINE 6.0 01/31/2016 1245   GLUCOSEU 150 (A) 01/31/2016 1245   HGBUR NEGATIVE 01/31/2016 1245   [place] 01/31/2016 1245   [place] 01/31/2016 1245   PROTEINUR >=300 (A) 01/31/2016 1245   UROBILINOGEN 0.2 09/19/2014 1620   NITRITE NEGATIVE 01/31/2016 1245   LEUKOCYTESUR NEGATIVE 01/31/2016 1245    )No results found for this or any previous visit (from the past 240 hour(s)).    Anti-infectives    None       Radiology Studies: Dg Chest 2 View  Result Date: 04/01/2016 CLINICAL DATA:  Right-sided chest pain and shortness of breath for 1 day EXAM: CHEST  2 VIEW COMPARISON:  02/18/2016 FINDINGS: The heart size and mediastinal contours are within normal limits. Both lungs  are clear. The visualized skeletal structures are unremarkable. IMPRESSION: No active cardiopulmonary disease. Electronically Signed   By: Inez Catalina M.D.   On: 04/01/2016 16:20   Ct Head Wo Contrast  Result Date: 04/01/2016 CLINICAL DATA:  Elevated blood pressure and headaches. EXAM: CT HEAD WITHOUT CONTRAST TECHNIQUE: Contiguous axial images were obtained from the base of the skull through the vertex without intravenous contrast. COMPARISON:  MRI 01/18/2015 FINDINGS: Brain: The ventricles are normal in size and configuration. No extra-axial fluid collections are identified. The gray-white differentiation is normal. No CT findings for acute intracranial process such as hemorrhage or infarction. No mass lesions. The brainstem and cerebellum are grossly normal. Vascular: No hyperdense vessels or aneurysm. Minimal scattered vascular calcifications. Skull: No skull fracture or bone lesion. Sinuses/Orbits: The paranasal sinuses and mastoid air cells are clear. The globes are intact. Other: No scalp lesions. IMPRESSION: Normal head CT. Electronically Signed   By: Marijo Sanes M.D.   On: 04/01/2016  20:19        Scheduled Meds: . allopurinol  100 mg Oral Daily  . amLODipine  10 mg Oral Daily  . aspirin  81 mg Oral Daily  . atorvastatin  40 mg Oral Daily  . calcitRIOL  0.25 mcg Oral Q M,W,F  . calcium acetate  1,334 mg Oral TID WC  . ferrous sulfate  325 mg Oral Q breakfast  . heparin  5,000 Units Subcutaneous Q8H  . lisinopril  10 mg Oral Daily  . magnesium oxide  400 mg Oral Daily  . metoprolol tartrate  50 mg Oral BID  . multivitamin with minerals  1 tablet Oral Daily  . pantoprazole  40 mg Oral Daily  . sertraline  50 mg Oral Daily  . sodium bicarbonate  650 mg Oral TID  . torsemide  40 mg Oral Daily   Continuous Infusions:   LOS: 1 day    Time spent: 25 min    Wisdom Seybold A, DO [facility] Pager [phone]  If 7PM-7AM, please contact night-coverage [email or web address] Password [email or web address] 04/03/2016, 12:03 PM

## 2016-04-03 NOTE — Plan of Care (Signed)
Problem: Safety: Goal: Ability to remain free from injury will improve Outcome: Progressing Fall risk bundle in place. No injuries this shift. Will continue to monitor pt.   Problem: Pain Managment: Goal: General experience of comfort will improve Outcome: Progressing Pt c/o 10/10 head ache and morphine was given per order. Pt stated the medication was effective in reducing the headache. Will continue to monitor pt and perform hourly rounding.

## 2016-04-04 LAB — RENAL FUNCTION PANEL
Albumin: 1.9 g/dL — ABNORMAL LOW (ref 3.5–5.0)
Anion gap: 10 (ref 5–15)
BUN: 37 mg/dL — AB (ref 6–20)
CO2: 19 mmol/L — AB (ref 22–32)
CREATININE: 5.03 mg/dL — AB (ref 0.44–1.00)
Calcium: 8.1 mg/dL — ABNORMAL LOW (ref 8.9–10.3)
Chloride: 111 mmol/L (ref 101–111)
GFR calc non Af Amer: 10 mL/min — ABNORMAL LOW (ref >60)
GFR, EST AFRICAN AMERICAN: 11 mL/min — AB (ref >60)
Glucose, Bld: 107 mg/dL — ABNORMAL HIGH (ref 65–99)
Phosphorus: 5.6 mg/dL — ABNORMAL HIGH (ref 2.5–4.6)
Potassium: 4.3 mmol/L (ref 3.5–5.1)
Sodium: 140 mmol/L (ref 135–145)

## 2016-04-04 LAB — MAGNESIUM: Magnesium: 1.8 mg/dL (ref 1.7–2.4)

## 2016-04-04 MED ORDER — FUROSEMIDE 10 MG/ML IJ SOLN
80.0000 mg | Freq: Two times a day (BID) | INTRAMUSCULAR | Status: DC
  Administered 2016-04-04 – 2016-04-05 (×2): 80 mg via INTRAVENOUS
  Filled 2016-04-04 (×2): qty 8

## 2016-04-04 NOTE — Progress Notes (Signed)
PROGRESS NOTE    Patricia [name]  FYB:[id number] DOB: Nov 16, 1975 DOA: 04/01/2016 PCP: Hillis Range   Outpatient Specialists:    Subjective: Appetite improved slightly since yesterday but not able to eat all her meal. Creatinine is slightly increasing, only about 300 mL of urine output yesterday, increase Lasix 80 twice a day. Follow urine output, check BMP in a.m. if creatinine continues to increase or has less urine output will consult renal.  Brief Narrative:  Patricia [name] is a 41 y.o. 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 Patient notes headache symptoms started on 3 days ago. Headache is global and patient reports felt like a "hard" pain. Complains of sensitivity to light. Tried using BC Goody powder and Tylenol without relief of symptoms. Then yesterday night c/o of intermittent right-sided chest pain that radiated to her back. During this time was blood pressure was elevated as high as 190/108. Her nephrologist's take a additional amlodipine 5mg  which she did without complete relief of symptoms. Associated complaints include nausea, vomiting for at least the last 2 days, decreased appetite, diaphoresis, blurry vision, and shortness of breath with exertion. Denies any palpitations, fever, loss of consciousness, diarrhea, constipation, rash, or focal weakness. She notes that some of these symptoms have been present since her kidney function started to decline. Her nephrologist is Dr. Francis Dowse at [facility]. Patient already has a left-sided fistula in place that is matured. She still makes a small amount of urine at this time. The last few months she's had at least 2 admissions for issues with chest pain and renal failure, but still has not been started on dialysis yet. She just recently had a nuclear stress tests 8 days ago at [facility] that was noted to be negative  for any acute signs of ischemia.   Assessment & Plan:   Principal Problem:   Chest pain Active Problems:   HTN (hypertension)   DM (diabetes mellitus) (HCC)   Hypercholesterolemia   Nausea and vomiting   ESRD (end stage renal disease) (HCC)   Anemia due to chronic kidney disease   GERD (gastroesophageal reflux disease)   HTN urgency -Isn't with blood pressure of 180/101, patient reports she had nausea and vomiting probably was vomiting her BP meds -On metoprolol and Norvasc, increased. -Metoprolol discontinued, started on labetalol and Norvasc dose increased to 10 mg. -Increase labetalol dose for better control  ESRD -Secondary to minimal change disease, not on HD, but had her fistula ready for dialysis in the left upper extremity. -no indications to start dialysis at this point after speaking with Dr. Lorrene Reid-- need to control BP closer -Minimal urine output (oliguric) 300 mL/day yesterday, increase Lasix. -If continued to have minimal urine output or creatinine worsen we will consult renal.  DM -Appears to be very controlled with diabetic diet. -Check hemoglobin A1c  N/V -Treated symptomatically, likely this is what caused hypertension as she was vomiting her pressure medications. -No loss of consciousness or continuous headache to suggest stress.  Hypomagnesemia -Please with IV supplements, recheck magnesium in a.m.    DVT prophylaxis:  SQ Heparin Code Status: Full Code Family Communication: Disposition Plan:      Objective: Vitals:   04/03/16 1343 04/03/16 1950 04/04/16 0503 04/04/16 0937  BP:  (!) 156/102 133/66 (!) 158/86  Pulse:   72   Resp:   18   Temp: 98.3 F (36.8  C) 98.1 F (36.7 C) 98.3 F (36.8 C)   TempSrc: Oral     SpO2:  98% 97%   Weight:   110.9 kg (244 lb 6.4 oz)   Height:        Intake/Output Summary (Last 24 hours) at 04/04/16 1128 Last data filed at 04/03/16 2100  Gross per 24 hour  Intake              500 ml  Output               200 ml  Net              300 ml   Filed Weights   04/01/16 2321 04/02/16 0511 04/04/16 0503  Weight: 112.3 kg (247 lb 8 oz) 112.6 kg (248 lb 3.2 oz) 110.9 kg (244 lb 6.4 oz)    Examination:  General exam: appears uncomfortable Respiratory system: Clear to auscultation. Respiratory effort normal. Cardiovascular system: S1 & S2 heard, RRR. No JVD, murmurs, rubs, gallops or clicks. No pedal edema. Gastrointestinal system: Abdomen is nondistended, soft and nontender. No organomegaly or masses felt. Normal bowel sounds heard. Central nervous system: Alert and oriented. No focal neurological deficits.    Data Reviewed: I have personally reviewed following labs and imaging studies  CBC:  Recent Labs Lab 04/01/16 1605 04/02/16 0413 04/03/16 0417  WBC 5.3 5.8 6.1  NEUTROABS  --  2.8  --   HGB 9.1* 8.2* 8.6*  HCT 28.5* 25.9* 27.2*  MCV 76.2* 76.0* 76.4*  PLT 325 286 063   Basic Metabolic Panel:  Recent Labs Lab 04/01/16 1605 04/02/16 0413 04/03/16 0417 04/04/16 0319  NA 141 137 139 140  K 4.7 4.0 4.2 4.3  CL 113* 111 110 111  CO2 15* 17* 18* 19*  GLUCOSE 90 104* 90 107*  BUN 40* 37* 32* 37*  CREATININE 4.86* 4.75* 4.86* 5.03*  CALCIUM 7.4* 7.1* 7.8* 8.1*  MG  --  1.2*  --  1.8  PHOS  --   --   --  5.6*   GFR: Estimated Creatinine Clearance: 18.7 mL/min (A) (by C-G formula based on SCr of 5.03 mg/dL (H)). Liver Function Tests:  Recent Labs Lab 04/02/16 0413 04/04/16 0319  AST 12*  --   ALT 10*  --   ALKPHOS 33*  --   BILITOT 0.3  --   PROT 5.0*  --   ALBUMIN 2.0* 1.9*   No results for input(s): LIPASE, AMYLASE in the last 168 hours. No results for input(s): AMMONIA in the last 168 hours. Coagulation Profile: No results for input(s): INR, PROTIME in the last 168 hours. Cardiac Enzymes:  Recent Labs Lab 04/01/16 2253 04/02/16 0413 04/02/16 0933  TROPONINI <0.03 <0.03 <0.03   BNP (last 3 results) No results for input(s): PROBNP in the last 8760  hours. HbA1C: No results for input(s): HGBA1C in the last 72 hours. CBG: No results for input(s): GLUCAP in the last 168 hours. Lipid Profile: No results for input(s): CHOL, HDL, LDLCALC, TRIG, CHOLHDL, LDLDIRECT in the last 72 hours. Thyroid Function Tests: No results for input(s): TSH, T4TOTAL, FREET4, T3FREE, THYROIDAB in the last 72 hours. Anemia Panel: No results for input(s): VITAMINB12, FOLATE, FERRITIN, TIBC, IRON, RETICCTPCT in the last 72 hours. Urine analysis:    Component Value Date/Time   COLORURINE YELLOW 04/03/2016 1626   APPEARANCEUR HAZY (A) 04/03/2016 1626   LABSPEC 1.012 04/03/2016 1626   PHURINE 6.0 04/03/2016 1626   GLUCOSEU 150 (A) 04/03/2016 1626  HGBUR NEGATIVE 04/03/2016 1626   BILIRUBINUR NEGATIVE 04/03/2016 1626   KETONESUR NEGATIVE 04/03/2016 1626   PROTEINUR >=300 (A) 04/03/2016 1626   UROBILINOGEN 0.2 09/19/2014 1620   NITRITE NEGATIVE 04/03/2016 1626   LEUKOCYTESUR NEGATIVE 04/03/2016 1626    )No results found for this or any previous visit (from the past 240 hour(s)).    Anti-infectives    None       Radiology Studies: US Renal  Result Date: 04/03/2016 CLINICAL DATA:  Chronic renal insufficiency.  Initial encounter. EXAM: RENAL / URINARY TRACT ULTRASOUND COMPLETE COMPARISON:  CT of the abdomen pelvis performed 10/01/2015, and abdominal ultrasound performed 05/06/2014 FINDINGS: Right Kidney: Length: 13.0 cm. Diffusely increased parenchymal echogenicity noted. No mass or hydronephrosis visualized. Left Kidney: Length: 12.5 cm. Diffusely increased parenchymal echogenicity noted. No mass or hydronephrosis visualized. Bladder: Appears normal for degree of bladder distention. IMPRESSION: 1. No evidence of hydronephrosis. 2. Changes of medical renal disease again noted. Electronically Signed   By: Garald Balding M.D.   On: 04/03/2016 19:57        Scheduled Meds: . allopurinol  100 mg Oral Daily  . amLODipine  10 mg Oral Daily  . aspirin  81  mg Oral Daily  . atorvastatin  40 mg Oral Daily  . calcitRIOL  0.25 mcg Oral Q M,W,F  . calcium acetate  1,334 mg Oral TID WC  . ferrous sulfate  325 mg Oral Q breakfast  . furosemide  80 mg Intravenous BID  . heparin  5,000 Units Subcutaneous Q8H  . labetalol  200 mg Oral BID  . magnesium oxide  400 mg Oral Daily  . multivitamin with minerals  1 tablet Oral Daily  . pantoprazole  40 mg Oral Daily  . sertraline  50 mg Oral Daily  . sodium bicarbonate  650 mg Oral TID   Continuous Infusions:   LOS: 2 days    Time spent: 25 min    Lena Fieldhouse A, DO [facility] Pager [phone]  If 7PM-7AM, please contact night-coverage [email or web address] Password [facility] 04/04/2016, 11:28 AM

## 2016-04-05 LAB — RENAL FUNCTION PANEL
Albumin: 1.9 g/dL — ABNORMAL LOW (ref 3.5–5.0)
Anion gap: 9 (ref 5–15)
BUN: 33 mg/dL — AB (ref 6–20)
CALCIUM: 8 mg/dL — AB (ref 8.9–10.3)
CO2: 19 mmol/L — AB (ref 22–32)
CREATININE: 5.15 mg/dL — AB (ref 0.44–1.00)
Chloride: 108 mmol/L (ref 101–111)
GFR calc non Af Amer: 9 mL/min — ABNORMAL LOW (ref >60)
GFR, EST AFRICAN AMERICAN: 11 mL/min — AB (ref >60)
Glucose, Bld: 89 mg/dL (ref 65–99)
Phosphorus: 5.6 mg/dL — ABNORMAL HIGH (ref 2.5–4.6)
Potassium: 4.1 mmol/L (ref 3.5–5.1)
SODIUM: 136 mmol/L (ref 135–145)

## 2016-04-05 MED ORDER — FUROSEMIDE 10 MG/ML IJ SOLN
160.0000 mg | Freq: Two times a day (BID) | INTRAVENOUS | Status: DC
  Administered 2016-04-05 – 2016-04-06 (×3): 160 mg via INTRAVENOUS
  Filled 2016-04-05 (×4): qty 16
  Filled 2016-04-05: qty 10
  Filled 2016-04-05: qty 16

## 2016-04-05 NOTE — Progress Notes (Signed)
PROGRESS NOTE    Patricia [name]  YIF:[id number] DOB: 05-18-1975 DOA: 04/01/2016 PCP: Hillis Range   Outpatient Specialists:    Subjective: Continues to feel tired and not able to eat that much, still oliguric with urine output of 300 mL/day. Discussed with Dr. Lorrene Reid of nephrology, increase Lasix to 160 twice a day, follow urine output and renal function. If continues to be oliguric need to contact nephrology again in a.m.  Brief Narrative:  Patricia [name] is a 41 y.o. 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 Patient notes headache symptoms started on 3 days ago. Headache is global and patient reports felt like a "hard" pain. Complains of sensitivity to light. Tried using BC Goody powder and Tylenol without relief of symptoms. Then yesterday night c/o of intermittent right-sided chest pain that radiated to her back. During this time was blood pressure was elevated as high as 190/108. Her nephrologist's take a additional amlodipine 5mg  which she did without complete relief of symptoms. Associated complaints include nausea, vomiting for at least the last 2 days, decreased appetite, diaphoresis, blurry vision, and shortness of breath with exertion. Denies any palpitations, fever, loss of consciousness, diarrhea, constipation, rash, or focal weakness. She notes that some of these symptoms have been present since her kidney function started to decline. Her nephrologist is Dr. Francis Dowse at [facility]. Patient already has a left-sided fistula in place that is matured. She still makes a small amount of urine at this time. The last few months she's had at least 2 admissions for issues with chest pain and renal failure, but still has not been started on dialysis yet. She just recently had a nuclear stress tests 8 days ago at [facility] that was noted to be negative for any acute signs  of ischemia.   Assessment & Plan:   Principal Problem:   Chest pain Active Problems:   HTN (hypertension)   DM (diabetes mellitus) (HCC)   Hypercholesterolemia   Nausea and vomiting   ESRD (end stage renal disease) (HCC)   Anemia due to chronic kidney disease   GERD (gastroesophageal reflux disease)   HTN urgency -Isn't with blood pressure of 180/101, patient reports she had nausea and vomiting probably was vomiting her BP meds -On metoprolol and Norvasc, increased. -Metoprolol discontinued, started on labetalol and Norvasc dose increased to 10 mg. -Blood pressure with better control after adjustment of medications. Currently on labetalol and Norvasc.  ESRD -Secondary to minimal change disease, not on HD, but had her fistula ready for dialysis in the left upper extremity. -No indications to start dialysis at this point after speaking with Dr. Lorrene Reid-- need to control BP closer -Minimal urine output (oliguric) 300 mL/day yesterday, increase Lasix. -Lasix increased to 160 twice a day, follow urine output and renal function closely we might need to get renal help  DM -Appears to be very controlled with diabetic diet. -No recent A1c, repeat  N/V -Treated symptomatically, likely this is what caused hypertension as she was vomiting her pressure medications. -No loss of consciousness or continuous headache to suggest stress.  Hypomagnesemia -Please with IV supplements, recheck magnesium in a.m.    DVT prophylaxis:  SQ Heparin Code Status: Full Code Family Communication: Disposition Plan:      Objective: Vitals:   04/04/16 0937 04/04/16 1437 04/04/16 2208 04/05/16 0415  BP: (!) 158/86 132/72 (!) 141/87 126/66  Pulse:  76 71 72  Resp:   12 14  Temp:  98.5 F (36.9 C) 98.1 F (36.7 C) 98.3 F (36.8 C)  TempSrc:  Oral Oral   SpO2:  96% 100% 97%  Weight:    110.4 kg (243 lb 4.8 oz)  Height:        Intake/Output Summary (Last 24 hours) at 04/05/16 1228 Last  data filed at 04/05/16 0800  Gross per 24 hour  Intake              200 ml  Output              500 ml  Net             -300 ml   Filed Weights   04/02/16 0511 04/04/16 0503 04/05/16 0415  Weight: 112.6 kg (248 lb 3.2 oz) 110.9 kg (244 lb 6.4 oz) 110.4 kg (243 lb 4.8 oz)    Examination:  General exam: appears uncomfortable Respiratory system: Clear to auscultation. Respiratory effort normal. Cardiovascular system: S1 & S2 heard, RRR. No JVD, murmurs, rubs, gallops or clicks. No pedal edema. Gastrointestinal system: Abdomen is nondistended, soft and nontender. No organomegaly or masses felt. Normal bowel sounds heard. Central nervous system: Alert and oriented. No focal neurological deficits.    Data Reviewed: I have personally reviewed following labs and imaging studies  CBC:  Recent Labs Lab 04/01/16 1605 04/02/16 0413 04/03/16 0417  WBC 5.3 5.8 6.1  NEUTROABS  --  2.8  --   HGB 9.1* 8.2* 8.6*  HCT 28.5* 25.9* 27.2*  MCV 76.2* 76.0* 76.4*  PLT 325 286 294   Basic Metabolic Panel:  Recent Labs Lab 04/01/16 1605 04/02/16 0413 04/03/16 0417 04/04/16 0319 04/05/16 0410  NA 141 137 139 140 136  K 4.7 4.0 4.2 4.3 4.1  CL 113* 111 110 111 108  CO2 15* 17* 18* 19* 19*  GLUCOSE 90 104* 90 107* 89  BUN 40* 37* 32* 37* 33*  CREATININE 4.86* 4.75* 4.86* 5.03* 5.15*  CALCIUM 7.4* 7.1* 7.8* 8.1* 8.0*  MG  --  1.2*  --  1.8  --   PHOS  --   --   --  5.6* 5.6*   GFR: Estimated Creatinine Clearance: 18.2 mL/min (A) (by C-G formula based on SCr of 5.15 mg/dL (H)). Liver Function Tests:  Recent Labs Lab 04/02/16 0413 04/04/16 0319 04/05/16 0410  AST 12*  --   --   ALT 10*  --   --   ALKPHOS 33*  --   --   BILITOT 0.3  --   --   PROT 5.0*  --   --   ALBUMIN 2.0* 1.9* 1.9*   No results for input(s): LIPASE, AMYLASE in the last 168 hours. No results for input(s): AMMONIA in the last 168 hours. Coagulation Profile: No results for input(s): INR, PROTIME in the  last 168 hours. Cardiac Enzymes:  Recent Labs Lab 04/01/16 2253 04/02/16 0413 04/02/16 0933  TROPONINI <0.03 <0.03 <0.03   BNP (last 3 results) No results for input(s): PROBNP in the last 8760 hours. HbA1C: No results for input(s): HGBA1C in the last 72 hours. CBG: No results for input(s): GLUCAP in the last 168 hours. Lipid Profile: No results for input(s): CHOL, HDL, LDLCALC, TRIG, CHOLHDL, LDLDIRECT in the last 72 hours. Thyroid Function Tests: No results for input(s): TSH, T4TOTAL, FREET4, T3FREE, THYROIDAB in the last 72 hours. Anemia Panel: No results for input(s): VITAMINB12, FOLATE, FERRITIN, TIBC, IRON, RETICCTPCT  in the last 72 hours. Urine analysis:    Component Value Date/Time   COLORURINE YELLOW 04/03/2016 1626   APPEARANCEUR HAZY (A) 04/03/2016 1626   LABSPEC 1.012 04/03/2016 1626   PHURINE 6.0 04/03/2016 1626   GLUCOSEU 150 (A) 04/03/2016 1626   HGBUR NEGATIVE 04/03/2016 1626   BILIRUBINUR NEGATIVE 04/03/2016 1626   KETONESUR NEGATIVE 04/03/2016 1626   PROTEINUR >=300 (A) 04/03/2016 1626   UROBILINOGEN 0.2 09/19/2014 1620   NITRITE NEGATIVE 04/03/2016 1626   LEUKOCYTESUR NEGATIVE 04/03/2016 1626    )No results found for this or any previous visit (from the past 240 hour(s)).    Anti-infectives    None       Radiology Studies: US Renal  Result Date: 04/03/2016 CLINICAL DATA:  Chronic renal insufficiency.  Initial encounter. EXAM: RENAL / URINARY TRACT ULTRASOUND COMPLETE COMPARISON:  CT of the abdomen pelvis performed 10/01/2015, and abdominal ultrasound performed 05/06/2014 FINDINGS: Right Kidney: Length: 13.0 cm. Diffusely increased parenchymal echogenicity noted. No mass or hydronephrosis visualized. Left Kidney: Length: 12.5 cm. Diffusely increased parenchymal echogenicity noted. No mass or hydronephrosis visualized. Bladder: Appears normal for degree of bladder distention. IMPRESSION: 1. No evidence of hydronephrosis. 2. Changes of medical renal  disease again noted. Electronically Signed   By: Garald Balding M.D.   On: 04/03/2016 19:57        Scheduled Meds: . allopurinol  100 mg Oral Daily  . amLODipine  10 mg Oral Daily  . aspirin  81 mg Oral Daily  . atorvastatin  40 mg Oral Daily  . calcitRIOL  0.25 mcg Oral Q M,W,F  . calcium acetate  1,334 mg Oral TID WC  . ferrous sulfate  325 mg Oral Q breakfast  . furosemide  160 mg Intravenous BID  . heparin  5,000 Units Subcutaneous Q8H  . labetalol  200 mg Oral BID  . magnesium oxide  400 mg Oral Daily  . multivitamin with minerals  1 tablet Oral Daily  . pantoprazole  40 mg Oral Daily  . sertraline  50 mg Oral Daily  . sodium bicarbonate  650 mg Oral TID   Continuous Infusions:   LOS: 3 days    Time spent: 25 min    Derold Dorsch A, DO [facility] Pager [phone]  If 7PM-7AM, please contact night-coverage [email or web address] Password [facility] 04/05/2016, 12:28 PM

## 2016-04-06 DIAGNOSIS — N185 Chronic kidney disease, stage 5: Secondary | ICD-10-CM

## 2016-04-06 DIAGNOSIS — T82898A Other specified complication of vascular prosthetic devices, implants and grafts, initial encounter: Secondary | ICD-10-CM

## 2016-04-06 DIAGNOSIS — Z992 Dependence on renal dialysis: Secondary | ICD-10-CM

## 2016-04-06 DIAGNOSIS — N186 End stage renal disease: Secondary | ICD-10-CM

## 2016-04-06 LAB — IRON AND TIBC
Iron: 63 ug/dL (ref 28–170)
SATURATION RATIOS: 31 % (ref 10.4–31.8)
TIBC: 206 ug/dL — AB (ref 250–450)
UIBC: 143 ug/dL

## 2016-04-06 LAB — CBC
HEMATOCRIT: 23.3 % — AB (ref 36.0–46.0)
Hemoglobin: 7.7 g/dL — ABNORMAL LOW (ref 12.0–15.0)
MCH: 25.2 pg — AB (ref 26.0–34.0)
MCHC: 33 g/dL (ref 30.0–36.0)
MCV: 76.1 fL — AB (ref 78.0–100.0)
Platelets: 290 10*3/uL (ref 150–400)
RBC: 3.06 MIL/uL — ABNORMAL LOW (ref 3.87–5.11)
RDW: 17.3 % — AB (ref 11.5–15.5)
WBC: 5.6 10*3/uL (ref 4.0–10.5)

## 2016-04-06 LAB — RENAL FUNCTION PANEL
Albumin: 1.8 g/dL — ABNORMAL LOW (ref 3.5–5.0)
Anion gap: 9 (ref 5–15)
BUN: 40 mg/dL — AB (ref 6–20)
CHLORIDE: 107 mmol/L (ref 101–111)
CO2: 21 mmol/L — AB (ref 22–32)
Calcium: 8 mg/dL — ABNORMAL LOW (ref 8.9–10.3)
Creatinine, Ser: 5.7 mg/dL — ABNORMAL HIGH (ref 0.44–1.00)
GFR calc Af Amer: 10 mL/min — ABNORMAL LOW (ref >60)
GFR, EST NON AFRICAN AMERICAN: 8 mL/min — AB (ref >60)
Glucose, Bld: 101 mg/dL — ABNORMAL HIGH (ref 65–99)
POTASSIUM: 4.1 mmol/L (ref 3.5–5.1)
Phosphorus: 4.6 mg/dL (ref 2.5–4.6)
Sodium: 137 mmol/L (ref 135–145)

## 2016-04-06 LAB — HEMOGLOBIN A1C
HEMOGLOBIN A1C: 5.9 % — AB (ref 4.8–5.6)
MEAN PLASMA GLUCOSE: 123 mg/dL

## 2016-04-06 LAB — URIC ACID: Uric Acid, Serum: 4.8 mg/dL (ref 2.3–6.6)

## 2016-04-06 MED ORDER — DARBEPOETIN ALFA 300 MCG/0.6ML IJ SOSY
300.0000 ug | PREFILLED_SYRINGE | Freq: Once | INTRAMUSCULAR | Status: DC
  Filled 2016-04-06: qty 0.6

## 2016-04-06 MED ORDER — AMLODIPINE BESYLATE 10 MG PO TABS
10.0000 mg | ORAL_TABLET | Freq: Every day | ORAL | Status: DC
  Administered 2016-04-07: 10 mg via ORAL
  Filled 2016-04-06: qty 1

## 2016-04-06 MED ORDER — DARBEPOETIN ALFA 200 MCG/0.4ML IJ SOSY
200.0000 ug | PREFILLED_SYRINGE | INTRAMUSCULAR | Status: DC
  Administered 2016-04-07: 200 ug via INTRAVENOUS
  Filled 2016-04-06 (×3): qty 0.4

## 2016-04-06 MED ORDER — SODIUM BICARBONATE 650 MG PO TABS: 1300.0000 mg | ORAL_TABLET | Freq: Two times a day (BID) | ORAL | Status: DC

## 2016-04-06 NOTE — Progress Notes (Signed)
PROGRESS NOTE    Patricia [name]  KZL:[id number] DOB: 12/26/1975 DOA: 04/01/2016 PCP: Hillis Range   Outpatient Specialists:    Subjective: She feels okay,nausea and vomiting resolved, still feels weak. Had 1100 mL of urine output yesterday, per patient made 300 mL of urine this morning. Nephrology to evaluate  Brief Narrative:  Patricia [name] is a 41 y.o. 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 Patient notes headache symptoms started on 3 days ago. Headache is global and patient reports felt like a "hard" pain. Complains of sensitivity to light. Tried using BC Goody powder and Tylenol without relief of symptoms. Then yesterday night c/o of intermittent right-sided chest pain that radiated to her back. During this time was blood pressure was elevated as high as 190/108. Her nephrologist's take a additional amlodipine 5mg  which she did without complete relief of symptoms. Associated complaints include nausea, vomiting for at least the last 2 days, decreased appetite, diaphoresis, blurry vision, and shortness of breath with exertion. Denies any palpitations, fever, loss of consciousness, diarrhea, constipation, rash, or focal weakness. She notes that some of these symptoms have been present since her kidney function started to decline. Her nephrologist is Dr. Francis Dowse at [facility]. Patient already has a left-sided fistula in place that is matured. She still makes a small amount of urine at this time. The last few months she's had at least 2 admissions for issues with chest pain and renal failure, but still has not been started on dialysis yet. She just recently had a nuclear stress tests 8 days ago at [facility] that was noted to be negative for any acute signs of ischemia.   Assessment & Plan:   Principal Problem:   Chest pain Active Problems:   HTN (hypertension)   DM (diabetes mellitus) (HCC)   Hypercholesterolemia   Nausea and vomiting   ESRD (end stage renal disease) (HCC)   Anemia due to chronic kidney disease   GERD (gastroesophageal reflux disease)   HTN urgency -Isn't with blood pressure of 180/101, patient reports she had nausea and vomiting probably was vomiting her BP meds -On metoprolol and Norvasc, increased. -Metoprolol discontinued, started on labetalol and Norvasc dose increased to 10 mg. -Blood pressure with better control after adjustment of medications. Currently on labetalol and Norvasc.  CKD stage V -Secondary to minimal change disease, not on HD, but had her fistula ready for dialysis in the left upper extremity. -She had minim IV twice a day, nadolol 1100 mL of urine yesterday. -Nephrology to see today.  Anemia -Anemia of CKD, hemoglobin is 7.7, per patient she is on EPO as outpatient. -Missed her dose last week, well restart EPO.  DM -Appears to be very controlled with diabetic diet. -No recent A1c, repeat  N/V -Treated symptomatically, likely this is what caused hypertension as she was vomiting her pressure medications. -No loss of consciousness or continuous headache to suggest stress.  Hypomagnesemia -his is repleted with IV supplements magnesium is 1.8.    DVT prophylaxis:  SQ Heparin Code Status: Full Code Family Communication: Disposition Plan:      Objective: Vitals:   04/05/16 1502 04/05/16 1900 04/06/16 0507 04/06/16 0900  BP: (!) 146/84 (!) 148/77 (!) 123/57 137/82  Pulse: 87 76 86   Resp: 16 17 16    Temp: 98.8 F (37.1 C) 98.5 F (36.9 C) 98.6 F (37 C)  TempSrc: Oral Oral    SpO2: 96% 100% 97%   Weight:   111 kg (244 lb 11.2 oz)   Height:        Intake/Output Summary (Last 24 hours) at 04/06/16 1219 Last data filed at 04/06/16 0854  Gross per 24 hour  Intake             1154 ml  Output              800 ml  Net              354 ml   Filed Weights   04/04/16 0503 04/05/16  0415 04/06/16 0507  Weight: 110.9 kg (244 lb 6.4 oz) 110.4 kg (243 lb 4.8 oz) 111 kg (244 lb 11.2 oz)    Examination:  General exam: appears uncomfortable Respiratory system: Clear to auscultation. Respiratory effort normal. Cardiovascular system: S1 & S2 heard, RRR. No JVD, murmurs, rubs, gallops or clicks. No pedal edema. Gastrointestinal system: Abdomen is nondistended, soft and nontender. No organomegaly or masses felt. Normal bowel sounds heard. Central nervous system: Alert and oriented. No focal neurological deficits.    Data Reviewed: I have personally reviewed following labs and imaging studies  CBC:  Recent Labs Lab 04/01/16 1605 04/02/16 0413 04/03/16 0417 04/06/16 0310  WBC 5.3 5.8 6.1 5.6  NEUTROABS  --  2.8  --   --   HGB 9.1* 8.2* 8.6* 7.7*  HCT 28.5* 25.9* 27.2* 23.3*  MCV 76.2* 76.0* 76.4* 76.1*  PLT 325 286 326 644   Basic Metabolic Panel:  Recent Labs Lab 04/02/16 0413 04/03/16 0417 04/04/16 0319 04/05/16 0410 04/06/16 0310  NA 137 139 140 136 137  K 4.0 4.2 4.3 4.1 4.1  CL 111 110 111 108 107  CO2 17* 18* 19* 19* 21*  GLUCOSE 104* 90 107* 89 101*  BUN 37* 32* 37* 33* 40*  CREATININE 4.75* 4.86* 5.03* 5.15* 5.70*  CALCIUM 7.1* 7.8* 8.1* 8.0* 8.0*  MG 1.2*  --  1.8  --   --   PHOS  --   --  5.6* 5.6* 4.6   GFR: Estimated Creatinine Clearance: 16.5 mL/min (A) (by C-G formula based on SCr of 5.7 mg/dL (H)). Liver Function Tests:  Recent Labs Lab 04/02/16 0413 04/04/16 0319 04/05/16 0410 04/06/16 0310  AST 12*  --   --   --   ALT 10*  --   --   --   ALKPHOS 33*  --   --   --   BILITOT 0.3  --   --   --   PROT 5.0*  --   --   --   ALBUMIN 2.0* 1.9* 1.9* 1.8*   No results for input(s): LIPASE, AMYLASE in the last 168 hours. No results for input(s): AMMONIA in the last 168 hours. Coagulation Profile: No results for input(s): INR, PROTIME in the last 168 hours. Cardiac Enzymes:  Recent Labs Lab 04/01/16 2253 04/02/16 0413  04/02/16 0933  TROPONINI <0.03 <0.03 <0.03   BNP (last 3 results) No results for input(s): PROBNP in the last 8760 hours. HbA1C:  Recent Labs  04/05/16 1300  HGBA1C 5.9*   CBG: No results for input(s): GLUCAP in the last 168 hours. Lipid Profile: No results for input(s): CHOL, HDL, LDLCALC, TRIG, CHOLHDL, LDLDIRECT in the last 72 hours. Thyroid Function Tests: No results for input(s): TSH, T4TOTAL, FREET4, T3FREE, THYROIDAB in the last 72 hours. Anemia Panel: No results for input(s): VITAMINB12, FOLATE, FERRITIN, TIBC,  IRON, RETICCTPCT in the last 72 hours. Urine analysis:    Component Value Date/Time   COLORURINE YELLOW 04/03/2016 1626   APPEARANCEUR HAZY (A) 04/03/2016 1626   LABSPEC 1.012 04/03/2016 1626   PHURINE 6.0 04/03/2016 1626   GLUCOSEU 150 (A) 04/03/2016 1626   HGBUR NEGATIVE 04/03/2016 1626   BILIRUBINUR NEGATIVE 04/03/2016 1626   KETONESUR NEGATIVE 04/03/2016 1626   PROTEINUR >=300 (A) 04/03/2016 1626   UROBILINOGEN 0.2 09/19/2014 1620   NITRITE NEGATIVE 04/03/2016 1626   LEUKOCYTESUR NEGATIVE 04/03/2016 1626    )No results found for this or any previous visit (from the past 240 hour(s)).    Anti-infectives    None       Radiology Studies: No results found.      Scheduled Meds: . allopurinol  100 mg Oral Daily  . amLODipine  10 mg Oral Daily  . aspirin  81 mg Oral Daily  . atorvastatin  40 mg Oral Daily  . calcitRIOL  0.25 mcg Oral Q M,W,F  . calcium acetate  1,334 mg Oral TID WC  . ferrous sulfate  325 mg Oral Q breakfast  . furosemide  160 mg Intravenous BID  . heparin  5,000 Units Subcutaneous Q8H  . labetalol  200 mg Oral BID  . magnesium oxide  400 mg Oral Daily  . multivitamin with minerals  1 tablet Oral Daily  . pantoprazole  40 mg Oral Daily  . sertraline  50 mg Oral Daily  . sodium bicarbonate  650 mg Oral TID   Continuous Infusions:   LOS: 4 days    Time spent: 25 min    Carlin Attridge A, DO [facility] Pager [phone]  If 7PM-7AM, please contact night-coverage [email or web address] Password [email or web address] 04/06/2016, 12:19 PM

## 2016-04-06 NOTE — Consult Note (Signed)
[facility]  [name] MRN: [id number] DOB: [birth year] Sex: female  REASON FOR CONSULT: non functioning left forearm fistula, consult is from Amalia Hailey, PA  HPI: Patricia [name] is a 41 y.o. female, who underwent attempted first HD session today. She had a left radial-cephalic fistula placed in [place] back in October 2017. The patient states that her vascular surgeon says her fistula may be used but may also need "ballooning."  She experienced an infiltration today in HD. She has not had any other access procedures. She has never had a TDC placed.   She presented to the [facility] ED with complaints of chest pain and headache. She currently denies any chest pain or headache. She is not on blood thinners. Her past medical history includes hyperlipidemia and hypertension.   Past Medical History:  Diagnosis Date  . Acid reflux   . Chronic kidney disease (CKD), stage III (moderate)   . Headache(784.0)    "related to chemo; sometimes weekly" (09/12/2013)  . High cholesterol   . History of blood transfusion "a couple"   "related to low counts"  . Hypertension   . Iron deficiency anemia    "get epogen shots q month" (02/20/2014)  . 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 Type II diabetes mellitus ([place])    "took me off my RX ~ 04/2013"    Family History  Problem Relation Age of Onset  . Hypertension Mother   . Thyroid disease Mother   . Coronary artery disease Father   . Hypertension Father   . Diabetes Father     SOCIAL HISTORY: Social History   Social History  . Marital status: Single    Spouse name: N/A  . Number of children: N/A  . Years of education: N/A   Occupational History  . Not on file.   Social History Main Topics  . Smoking status: Never Smoker  . Smokeless tobacco: Never Used  . Alcohol use No  . Drug use: No  . Sexual activity: Not  Currently    Birth control/ protection: Surgical   Other Topics Concern  . Not on file   Social History Narrative  . No narrative on file    Allergies  Allergen Reactions  . Nsaids Other (See Comments)    Cannot take due to Kidney disease  . Tolmetin Other (See Comments)    Cannot take due to Kidney Disease    Current Facility-Administered Medications  Medication Dose Route Frequency Provider Last Rate Last Dose  . acetaminophen (TYLENOL) tablet 650 mg  650 mg Oral Q4H PRN Norval Morton, MD   650 mg at 04/02/16 1648  . allopurinol (ZYLOPRIM) tablet 100 mg  100 mg Oral Daily Norval Morton, MD   100 mg at 04/06/16 0856  . [START ON 04/07/2016] amLODipine (NORVASC) tablet 10 mg  10 mg Oral QHS Mauricia Area, MD      . aspirin chewable tablet 81 mg  81 mg Oral Daily Norval Morton, MD   81 mg at 04/06/16 0856  . atorvastatin (LIPITOR) tablet 40 mg  40 mg Oral Daily Norval Morton, MD   40 mg at 04/06/16 0856  . calcitRIOL (ROCALTROL) capsule 0.25 mcg  0.25 mcg Oral Q M,W,F Norval Morton, MD   0.25 mcg at 04/06/16 0854  . calcium acetate (PHOSLO) capsule 1,334 mg  1,334 mg Oral  TID WC Rondell Charmayne Sheer, MD   1,334 mg at 04/06/16 1223  . [START ON 04/07/2016] Darbepoetin Alfa (ARANESP) injection 200 mcg  200 mcg Intravenous Q Tue-HD Mauricia Area, MD      . ferrous sulfate tablet 325 mg  325 mg Oral Q breakfast Norval Morton, MD   325 mg at 04/06/16 0855  . furosemide (LASIX) 160 mg in dextrose 5 % 50 mL IVPB  160 mg Intravenous BID Verlee Monte, MD   160 mg at 04/06/16 0854  . gi cocktail (Maalox,Lidocaine,Donnatal)  30 mL Oral QID PRN Norval Morton, MD      . heparin injection 5,000 Units  5,000 Units Subcutaneous Q8H Rondell A Tamala Julian, MD   5,000 Units at 04/05/16 1318  . hydrALAZINE (APRESOLINE) injection 10-20 mg  10-20 mg Intravenous Q4H PRN Rondell A Tamala Julian, MD      . labetalol (NORMODYNE) tablet 200 mg  200 mg Oral BID Verlee Monte, MD   200 mg at 04/06/16 0855  . LORazepam  (ATIVAN) tablet 0.5 mg  0.5 mg Oral Q8H PRN Norval Morton, MD   0.5 mg at 04/03/16 7893  . morphine 4 MG/ML injection 2 mg  2 mg Intravenous Q2H PRN Norval Morton, MD   2 mg at 04/03/16 1423  . multivitamin with minerals tablet 1 tablet  1 tablet Oral Daily Norval Morton, MD   1 tablet at 04/06/16 0855  . ondansetron (ZOFRAN) injection 4 mg  4 mg Intravenous Q6H PRN Norval Morton, MD   4 mg at 04/05/16 1739  . pantoprazole (PROTONIX) EC tablet 40 mg  40 mg Oral Daily Norval Morton, MD   40 mg at 04/06/16 0856  . sertraline (ZOLOFT) tablet 50 mg  50 mg Oral Daily Norval Morton, MD   50 mg at 04/06/16 0855  . temazepam (RESTORIL) capsule 15 mg  15 mg Oral QHS PRN Norval Morton, MD   15 mg at 04/05/16 2107    REVIEW OF SYSTEMS:  $RemoveB'[X]'epnqgdks$  denotes positive finding, $RemoveBeforeDEI'[ ]'aozjEoVqDcOgGsit$  denotes negative finding Cardiac  Comments:  Chest pain or chest pressure:    Shortness of breath upon exertion:    Short of breath when lying flat:    Irregular heart rhythm:        Vascular    Pain in calf, thigh, or hip brought on by ambulation:    Pain in feet at night that wakes you up from your sleep:     Blood clot in your veins:    Leg swelling:  x minimal      Pulmonary    Oxygen at home:    Productive cough:     Wheezing:         Neurologic    Sudden weakness in arms or legs:     Sudden numbness in arms or legs:     Sudden onset of difficulty speaking or slurred speech:    Temporary loss of vision in one eye:     Problems with dizziness:         Gastrointestinal    Blood in stool:     Vomited blood:         Genitourinary    Burning when urinating:     Blood in urine:        Psychiatric    Major depression:         Hematologic    Bleeding problems:    Problems with blood clotting too  easily:        Skin    Rashes or ulcers:        Constitutional    Fever or chills:      PHYSICAL EXAM: Vitals:   04/05/16 1900 04/06/16 0507 04/06/16 0900 04/06/16 1427  BP: (!) 148/77 (!) 123/57  137/82 (!) 155/78  Pulse: 76 86  76  Resp: $Remo'17 16  16  'aeOUk$ Temp: 98.5 F (36.9 C) 98.6 F (37 C)  98.4 F (36.9 C)  TempSrc: Oral   Oral  SpO2: 100% 97%  100%  Weight:  244 lb 11.2 oz (111 kg)    Height:        GENERAL: The patient is a well-nourished female, in no acute distress. The vital signs are documented above. CARDIAC: There is a regular rate and rhythm. No carotid bruits.  VASCULAR: Palpable thrill throughout left forearm fistula but fistula does feel more deep away from anastomosis. Marland Kitchen PULMONARY: Lungs clear, non labored respiratory effort.  MUSCULOSKELETAL: There are no major deformities or cyanosis. NEUROLOGIC: No focal weakness or paresthesias are detected. SKIN: There are no ulcers or rashes noted. PSYCHIATRIC: The patient has a normal affect.   MEDICAL ISSUES: New ESRD Non functioning left radial-cephalic fistula  Plan for fistulogram and TDC placement tomorrow with Dr. Donzetta Matters.    Virgina Jock, PA-C [facility] [place]   I have examined the patient, reviewed and agree with above. Palpable thrill at wrist and up to antecubital space. Some mild infiltration distally above the wrist. Difficult to tell by physical exam if the vein size is adequate or if it simply runs excessively deep under the fat. Scheduled for tunneled dialysis catheter and fistulogram in the operating room tomorrow with Dr. Theadore Nan, MD 04/06/2016 5:51 PM

## 2016-04-06 NOTE — Progress Notes (Signed)
Patient arrived to hemodialysis unit with AVF to LFA. AVF noted to be short in length with a limited area for cannulation. Clots noted in needle upon 1st attempt to cannulating the arterial site.  Arterial site cannulated with success.  Venous area noted to deep but cannulated with success.  Upon starting dialysis machine pt c/o of pain in venous area.  Area noted to have a small infiltration.  Dr. Jimmy Footman notified, order received to discontinue dialysis for today.   Report called to Hubert Azure.  Pt without complaint upon discharge from hemodialysis unit.

## 2016-04-06 NOTE — Procedures (Signed)
I was present at this session.  I have reviewed the session itself and made appropriate changes.  Prob sticking fistula will need intervention.  Patricia [name] L 4/2/20184:32 PM

## 2016-04-06 NOTE — Care Management Important Message (Signed)
Important Message  Patient Details  Name: Patricia [name] MRN: [id number] Date of Birth: March 21, 1975   Medicare Important Message Given:  Yes    Valeria Boza [name] 04/06/2016, 4:18 PM

## 2016-04-06 NOTE — Consult Note (Signed)
Reason for Consult:CKD 5 Referring Physician: Dr. Shonna Chock Patricia [name] is an 41 y.o. female.  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 Treated with multilple regimens with no response in [facility] (CTX, Rituximab, Prograf).  Has aVF placed 10.17.  Hosp x 2 at [facility] in past 2 mon for fluid overload , HTN, N, V , and malase.  Now with HA, bp out or control, SOB, vol xs, N, anorexia, itching , SOB with <71f,, and malaise.  [facility].. Constitutional: miseable, as above Eyes: swelling around Ears, nose, mouth, throat, and face: edema Respiratory: as above, no cough or wheezing Cardiovascular: negative, edema, SOB,  Gastrointestinal: negative, N, V, upset stom, no D,C Genitourinary:negative Integument/breast: itching Hematologic/lymphatic: on ESA Musculoskeletal:negative Neurological: negative Endocrine: past DM Allergic/Immunologic: not really allergic, but told not to take NSAIDS, or Tolmetin   Past Medical History:  Diagnosis Date  . Acid reflux   . Chronic kidney disease (CKD), stage III (moderate)   . Headache(784.0)    "related to chemo; sometimes weekly" (09/12/2013)  . High cholesterol   . History of blood transfusion "a couple"   "related to low counts"  . Hypertension   . Iron deficiency anemia    "get epogen shots q month" (02/20/2014)  . 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 [place]    "took me [place] my RX ~ 04/2013"    Past Surgical History:  Procedure Laterality Date  . ABDOMINAL HYSTERECTOMY  2010   "laparoscopic"  . ANKLE FRACTURE SURGERY Right 1994  . CARDIAC CATHETERIZATION  2000's  . FRACTURE SURGERY      Family History  Problem Relation Age of Onset  . Hypertension Mother   . Thyroid disease Mother   . Coronary artery disease Father   . Hypertension Father   . Diabetes Father     Social History:   reports that she has never smoked. She has never used smokeless tobacco. She reports that she does not drink alcohol or use drugs.  Allergies:  Allergies  Allergen Reactions  . Nsaids Other (See Comments)    Cannot take due to Kidney disease  . Tolmetin Other (See Comments)    Cannot take due to Kidney Disease    Medications:  I have reviewed the patient's current medications. Prior to Admission:  Prescriptions Prior to Admission  Medication Sig Dispense Refill Last Dose  . allopurinol (ZYLOPRIM) 100 MG tablet Take 100 mg by mouth daily.   04/01/2016 at Unknown time  . amLODipine (NORVASC) 5 MG tablet Take 5 mg by mouth daily.  3 04/01/2016 at Unknown time  . aspirin 81 MG chewable tablet Chew 1 tablet (81 mg total) by mouth daily.   04/01/2016 at Unknown time  . Aspirin-Acetaminophen-Caffeine (GOODY HEADACHE PO) Take 1 Package by mouth daily as needed (pain).   04/01/2016 at Unknown time  . atorvastatin (LIPITOR) 40 MG tablet Take 40 mg by mouth daily.  3 03/31/2016 at Unknown time  . calcitRIOL (ROCALTROL) 0.25 MCG capsule Take 0.25 mcg by mouth every Monday, Wednesday, and Friday.  3 04/01/2016 at Unknown time  . calcium acetate (PHOSLO) 667 MG capsule Take 1,334 mg by mouth See admin instructions. Usually only east once daily   04/01/2016 at Unknown time  . cholecalciferol (VITAMIN D) 400 units TABS tablet Take 400  Units by mouth daily.   04/01/2016 at Unknown time  . Darbepoetin Alfa (ARANESP) 300 MCG/0.6ML SOSY injection Inject 300 mcg into the skin every 14 (fourteen) days.   02/2016 at Unknown time  . diphenhydramine-acetaminophen (TYLENOL PM) 25-500 MG TABS tablet Take 2 tablets by mouth at bedtime as needed (for sleep).    Past Month at Unknown time  . ferrous sulfate 325 (65 FE) MG EC tablet Take 325 mg by mouth daily.   04/01/2016 at Unknown time  . lisinopril (PRINIVIL,ZESTRIL) 10 MG tablet Take 10 mg by mouth daily.   04/01/2016 at Unknown time  . LORazepam (ATIVAN) 0.5 MG tablet Take  0.5 mg by mouth every 8 (eight) hours as needed for anxiety.    Past Month at Unknown time  . magnesium oxide (MAG-OX) 400 MG tablet Take 1 tablet (400 mg total) by mouth daily. 60 tablet 0 04/01/2016 at Unknown time  . metoprolol tartrate (LOPRESSOR) 25 MG tablet Take 1 tablet (25 mg total) by mouth 2 (two) times daily. 62 tablet 0 04/01/2016 at Unknown time  . Multiple Vitamin (MULTIVITAMIN WITH MINERALS) TABS tablet Take 1 tablet by mouth daily.   04/01/2016 at Unknown time  . omeprazole (PRILOSEC) 40 MG capsule Take 40 mg by mouth daily.     04/01/2016 at Unknown time  . potassium chloride (K-DUR) 10 MEQ tablet Take 10 mEq by mouth daily.  9 04/01/2016 at Unknown time  . promethazine (PHENERGAN) 25 MG tablet Take 1 tablet (25 mg total) by mouth every 6 (six) hours as needed for nausea or vomiting. 10 tablet 0 03/31/2016 at Unknown time  . sertraline (ZOLOFT) 25 MG tablet Take 50 mg by mouth daily.    04/01/2016 at Unknown time  . sodium bicarbonate 650 MG tablet Take 650 mg by mouth 3 (three) times daily.  0 04/01/2016 at Unknown time  . temazepam (RESTORIL) 15 MG capsule Take 15 mg by mouth at bedtime as needed for sleep.   Past Month at Unknown time  . torsemide (DEMADEX) 20 MG tablet Take 2 tablets (40 mg total) by mouth daily. (Patient taking differently: Take 80 mg by mouth daily as needed (fluid). ) 30 tablet 0 Past Week at Unknown time  . colchicine 0.6 MG tablet Take 0.6 mg by mouth every other day.   Past Week at Unknown time   Calcitriol .43mg po qd Aranesp 3035m iv q 2wk  Results for orders placed or performed during the hospital encounter of 04/01/16 (from the past 48 hour(s))  Renal function panel     Status: Abnormal   Collection Time: 04/05/16  4:10 AM  Result Value Ref Range   Sodium 136 135 - 145 mmol/L   Potassium 4.1 3.5 - 5.1 mmol/L   Chloride 108 101 - 111 mmol/L   CO2 19 (L) 22 - 32 mmol/L   Glucose, Bld 89 65 - 99 mg/dL   BUN 33 (H) 6 - 20 mg/dL   Creatinine, Ser 5.15  (H) 0.44 - 1.00 mg/dL   Calcium 8.0 (L) 8.9 - 10.3 mg/dL   Phosphorus 5.6 (H) 2.5 - 4.6 mg/dL   Albumin 1.9 (L) 3.5 - 5.0 g/dL   GFR calc non Af Amer 9 (L) >60 mL/min   GFR calc Af Amer 11 (L) >60 mL/min    Comment: (NOTE) The eGFR has been calculated using the CKD EPI equation. This calculation has not been validated in all clinical situations. eGFR's persistently <60 mL/min signify possible Chronic Kidney Disease.  Anion gap 9 5 - 15  Hemoglobin A1c     Status: Abnormal   Collection Time: 04/05/16  1:00 PM  Result Value Ref Range   Hgb A1c MFr Bld 5.9 (H) 4.8 - 5.6 %    Comment: (NOTE)         Pre-diabetes: 5.7 - 6.4         Diabetes: >6.4         Glycemic control for adults with diabetes: <7.0    Mean Plasma Glucose 123 mg/dL    Comment: (NOTE) Performed At: [facility] [place], [place] [id number] Lindon Romp MD [id number]   Renal function panel     Status: Abnormal   Collection Time: 04/06/16  3:10 AM  Result Value Ref Range   Sodium 137 135 - 145 mmol/L   Potassium 4.1 3.5 - 5.1 mmol/L   Chloride 107 101 - 111 mmol/L   CO2 21 (L) 22 - 32 mmol/L   Glucose, Bld 101 (H) 65 - 99 mg/dL   BUN 40 (H) 6 - 20 mg/dL   Creatinine, Ser 5.70 (H) 0.44 - 1.00 mg/dL   Calcium 8.0 (L) 8.9 - 10.3 mg/dL   Phosphorus 4.6 2.5 - 4.6 mg/dL   Albumin 1.8 (L) 3.5 - 5.0 g/dL   GFR calc non Af Amer 8 (L) >60 mL/min   GFR calc Af Amer 10 (L) >60 mL/min    Comment: (NOTE) The eGFR has been calculated using the CKD EPI equation. This calculation has not been validated in all clinical situations. eGFR's persistently <60 mL/min signify possible Chronic Kidney Disease.    Anion gap 9 5 - 15  CBC     Status: Abnormal   Collection Time: 04/06/16  3:10 AM  Result Value Ref Range   WBC 5.6 4.0 - 10.5 K/uL   RBC 3.06 (L) 3.87 - 5.11 MIL/uL   Hemoglobin 7.7 (L) 12.0 - 15.0 g/dL   HCT 23.3 (L) 36.0 - 46.0 %   MCV 76.1 (L) 78.0 - 100.0 fL   MCH 25.2 (L) 26.0 -  34.0 pg   MCHC 33.0 30.0 - 36.0 g/dL   RDW 17.3 (H) 11.5 - 15.5 %   Platelets 290 150 - 400 K/uL    No results found.  ROS Blood pressure (!) 155/78, pulse 76, temperature 98.4 F (36.9 C), temperature source Oral, resp. rate 16, height _0  (1.702 m), weight 111 kg (244 lb 11.2 oz), SpO2 100 %. Physical Exam Physical Examination: General appearance - overweight and NAD Mental status - alert, oriented to person, place, and time Eyes - pupils equal and reactive, extraocular eye movements intact, hypertensive retinopathy Mouth - mucous membranes moist, pharynx normal without lesions Neck - adenopathy noted PCL Lymphatics - posterior cervical nodes Chest - decreased air entry noted bilat, rales in bases Heart - S1 and S2 normal, S4 present, systolic murmur YK5/9 at 2nd left intercostal space Abdomen - obese,pos bs, soft, liver down 5 cm Musculoskeletal - no joint tenderness, deformity or swelling Extremities - pedal edema 2 +, intact peripheral pulses Skin - dry , skin tags  Assessment/Plan: 1 Uremia needs to start HD.  Vol xs and uremic sx, acidemia. 2 ESRD: will get CLIP , concern over fistula 3 Hypertension: vol and meds 4. 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 Metabolic Bone Disease: check PTH 6 Obesity 7 DM P HD, esa, Fe, PTH, CLIP  Sascha Baugher L 04/06/2016, 2:34 PM

## 2016-04-07 ENCOUNTER — Inpatient Hospital Stay (HOSPITAL_COMMUNITY): Payer: Medicare Other

## 2016-04-07 ENCOUNTER — Encounter (HOSPITAL_COMMUNITY): Payer: Self-pay | Admitting: Certified Registered Nurse Anesthetist

## 2016-04-07 ENCOUNTER — Inpatient Hospital Stay (HOSPITAL_COMMUNITY): Payer: Medicare Other | Admitting: Certified Registered Nurse Anesthetist

## 2016-04-07 ENCOUNTER — Encounter (HOSPITAL_COMMUNITY)
Admission: EM | Disposition: A | Discharge status: Home Health | Payer: Self-pay | Source: Home / Self Care | Attending: Internal Medicine

## 2016-04-07 DIAGNOSIS — Z992 Dependence on renal dialysis: Secondary | ICD-10-CM

## 2016-04-07 HISTORY — PX: FISTULOGRAM: SHX5832

## 2016-04-07 HISTORY — PX: INSERTION OF DIALYSIS CATHETER: SHX1324

## 2016-04-07 LAB — HEPATITIS B SURFACE ANTIBODY,QUALITATIVE: HEP B S AB: NONREACTIVE

## 2016-04-07 LAB — GLUCOSE, CAPILLARY: Glucose-Capillary: 97 mg/dL (ref 65–99)

## 2016-04-07 LAB — RENAL FUNCTION PANEL
Albumin: 1.9 g/dL — ABNORMAL LOW (ref 3.5–5.0)
Anion gap: 7 (ref 5–15)
BUN: 40 mg/dL — AB (ref 6–20)
CALCIUM: 8.1 mg/dL — AB (ref 8.9–10.3)
CO2: 20 mmol/L — AB (ref 22–32)
Chloride: 108 mmol/L (ref 101–111)
Creatinine, Ser: 5.58 mg/dL — ABNORMAL HIGH (ref 0.44–1.00)
GFR calc non Af Amer: 9 mL/min — ABNORMAL LOW (ref >60)
GFR, EST AFRICAN AMERICAN: 10 mL/min — AB (ref >60)
GLUCOSE: 89 mg/dL (ref 65–99)
Phosphorus: 4.8 mg/dL — ABNORMAL HIGH (ref 2.5–4.6)
Potassium: 4.6 mmol/L (ref 3.5–5.1)
SODIUM: 135 mmol/L (ref 135–145)

## 2016-04-07 LAB — CBC
HEMATOCRIT: 24.4 % — AB (ref 36.0–46.0)
Hemoglobin: 7.7 g/dL — ABNORMAL LOW (ref 12.0–15.0)
MCH: 23.8 pg — ABNORMAL LOW (ref 26.0–34.0)
MCHC: 31.6 g/dL (ref 30.0–36.0)
MCV: 75.5 fL — AB (ref 78.0–100.0)
Platelets: 298 10*3/uL (ref 150–400)
RBC: 3.23 MIL/uL — ABNORMAL LOW (ref 3.87–5.11)
RDW: 16.6 % — AB (ref 11.5–15.5)
WBC: 5.5 10*3/uL (ref 4.0–10.5)

## 2016-04-07 LAB — HEPATITIS B SURFACE ANTIGEN: HEP B S AG: NEGATIVE

## 2016-04-07 LAB — HEPATITIS B CORE ANTIBODY, IGM: Hep B C IgM: NEGATIVE

## 2016-04-07 LAB — SURGICAL PCR SCREEN
MRSA, PCR: NEGATIVE
Staphylococcus aureus: NEGATIVE

## 2016-04-07 SURGERY — INSERTION OF DIALYSIS CATHETER
Anesthesia: Monitor Anesthesia Care | Laterality: Right

## 2016-04-07 MED ORDER — LIDOCAINE-PRILOCAINE 2.5-2.5 % EX CREA: 1.0000 "application " | TOPICAL_CREAM | CUTANEOUS | Status: DC | PRN

## 2016-04-07 MED ORDER — CEFAZOLIN SODIUM-DEXTROSE 2-4 GM/100ML-% IV SOLN
2.0000 g | Freq: Once | INTRAVENOUS | Status: AC
  Administered 2016-04-07: 2 g via INTRAVENOUS

## 2016-04-07 MED ORDER — LIDOCAINE HCL (PF) 1 % IJ SOLN: 5.0000 mL | INTRAMUSCULAR | Status: DC | PRN

## 2016-04-07 MED ORDER — SODIUM CHLORIDE 0.9 % IV SOLN
INTRAVENOUS | Status: DC
  Administered 2016-04-07: 08:00:00 via INTRAVENOUS

## 2016-04-07 MED ORDER — PROMETHAZINE HCL 25 MG/ML IJ SOLN
12.5000 mg | Freq: Once | INTRAMUSCULAR | Status: AC
  Administered 2016-04-07: 12.5 mg via INTRAVENOUS
  Filled 2016-04-07: qty 1

## 2016-04-07 MED ORDER — PROPOFOL 500 MG/50ML IV EMUL
INTRAVENOUS | Status: DC | PRN
  Administered 2016-04-07: 75 ug/kg/min via INTRAVENOUS

## 2016-04-07 MED ORDER — FENTANYL CITRATE (PF) 250 MCG/5ML IJ SOLN
INTRAMUSCULAR | Status: AC
  Filled 2016-04-07: qty 5

## 2016-04-07 MED ORDER — POTASSIUM CHLORIDE CRYS ER 10 MEQ PO TBCR
10.0000 meq | EXTENDED_RELEASE_TABLET | Freq: Every day | ORAL | Status: DC
  Administered 2016-04-07: 10 meq via ORAL
  Filled 2016-04-07 (×3): qty 1

## 2016-04-07 MED ORDER — MIDAZOLAM HCL 2 MG/2ML IJ SOLN
INTRAMUSCULAR | Status: AC
  Filled 2016-04-07: qty 2

## 2016-04-07 MED ORDER — HEPARIN SODIUM (PORCINE) 1000 UNIT/ML IJ SOLN
INTRAMUSCULAR | Status: DC | PRN
  Administered 2016-04-07: 1000 [IU] via INTRAVENOUS

## 2016-04-07 MED ORDER — HEPARIN SODIUM (PORCINE) 1000 UNIT/ML DIALYSIS: 40.0000 [IU]/kg | Freq: Once | INTRAMUSCULAR | Status: DC

## 2016-04-07 MED ORDER — HEPARIN SODIUM (PORCINE) 1000 UNIT/ML DIALYSIS
40.0000 [IU]/kg | Freq: Once | INTRAMUSCULAR | Status: DC
  Filled 2016-04-07: qty 5

## 2016-04-07 MED ORDER — LIDOCAINE HCL (PF) 1 % IJ SOLN
INTRAMUSCULAR | Status: DC | PRN
  Administered 2016-04-07: 30 mL

## 2016-04-07 MED ORDER — FENTANYL CITRATE (PF) 100 MCG/2ML IJ SOLN
INTRAMUSCULAR | Status: DC | PRN
  Administered 2016-04-07 (×2): 50 ug via INTRAVENOUS

## 2016-04-07 MED ORDER — HEPARIN SODIUM (PORCINE) 1000 UNIT/ML DIALYSIS: 1000.0000 [IU] | INTRAMUSCULAR | Status: DC | PRN

## 2016-04-07 MED ORDER — SODIUM CHLORIDE 0.9 % IV SOLN: 100.0000 mL | INTRAVENOUS | Status: DC | PRN

## 2016-04-07 MED ORDER — PENTAFLUOROPROP-TETRAFLUOROETH EX AERO: 1.0000 "application " | INHALATION_SPRAY | CUTANEOUS | Status: DC | PRN

## 2016-04-07 MED ORDER — SODIUM CHLORIDE 0.9 % IV SOLN
INTRAVENOUS | Status: DC | PRN
  Administered 2016-04-07: 10:00:00

## 2016-04-07 MED ORDER — ALTEPLASE 2 MG IJ SOLR: 2.0000 mg | Freq: Once | INTRAMUSCULAR | Status: DC | PRN

## 2016-04-07 MED ORDER — DARBEPOETIN ALFA 200 MCG/0.4ML IJ SOSY
PREFILLED_SYRINGE | INTRAMUSCULAR | Status: AC
  Filled 2016-04-07: qty 0.4

## 2016-04-07 MED ORDER — LIDOCAINE HCL (PF) 1 % IJ SOLN
INTRAMUSCULAR | Status: AC
  Filled 2016-04-07: qty 30

## 2016-04-07 MED ORDER — CHOLECALCIFEROL 10 MCG (400 UNIT) PO TABS
400.0000 [IU] | ORAL_TABLET | Freq: Every day | ORAL | Status: DC
  Filled 2016-04-07 (×2): qty 1

## 2016-04-07 MED ORDER — FENTANYL CITRATE (PF) 100 MCG/2ML IJ SOLN
25.0000 ug | INTRAMUSCULAR | Status: DC | PRN
  Administered 2016-04-07 (×2): 25 ug via INTRAVENOUS

## 2016-04-07 MED ORDER — MIDAZOLAM HCL 5 MG/5ML IJ SOLN
INTRAMUSCULAR | Status: DC | PRN
  Administered 2016-04-07: 2 mg via INTRAVENOUS

## 2016-04-07 MED ORDER — FENTANYL CITRATE (PF) 100 MCG/2ML IJ SOLN
INTRAMUSCULAR | Status: AC
  Administered 2016-04-07: 25 ug via INTRAVENOUS
  Filled 2016-04-07: qty 2

## 2016-04-07 MED ORDER — 0.9 % SODIUM CHLORIDE (POUR BTL) OPTIME
TOPICAL | Status: DC | PRN
  Administered 2016-04-07: 1000 mL

## 2016-04-07 MED ORDER — OXYCODONE HCL 5 MG PO TABS
5.0000 mg | ORAL_TABLET | Freq: Four times a day (QID) | ORAL | Status: DC | PRN
  Administered 2016-04-07 – 2016-04-14 (×9): 5 mg via ORAL
  Filled 2016-04-07 (×8): qty 1

## 2016-04-07 MED ORDER — PROMETHAZINE HCL 25 MG/ML IJ SOLN: 12.5000 mg | Freq: Four times a day (QID) | INTRAMUSCULAR | Status: DC | PRN

## 2016-04-07 MED ORDER — PHENYLEPHRINE HCL 10 MG/ML IJ SOLN
INTRAMUSCULAR | Status: DC | PRN
  Administered 2016-04-07 (×2): 80 ug via INTRAVENOUS
  Administered 2016-04-07: 40 ug via INTRAVENOUS

## 2016-04-07 MED ORDER — ONDANSETRON HCL 4 MG/2ML IJ SOLN
INTRAMUSCULAR | Status: AC
  Filled 2016-04-07: qty 2

## 2016-04-07 MED ORDER — IODIXANOL 320 MG/ML IV SOLN
INTRAVENOUS | Status: DC | PRN
  Administered 2016-04-07: 100 mL via INTRAVENOUS

## 2016-04-07 MED ORDER — PROMETHAZINE HCL 25 MG/ML IJ SOLN: 6.2500 mg | INTRAMUSCULAR | Status: DC | PRN

## 2016-04-07 MED ORDER — MORPHINE SULFATE (PF) 4 MG/ML IV SOLN
INTRAVENOUS | Status: AC
  Filled 2016-04-07: qty 1

## 2016-04-07 MED ORDER — HEPARIN SODIUM (PORCINE) 1000 UNIT/ML DIALYSIS
1000.0000 [IU] | INTRAMUSCULAR | Status: DC | PRN
  Filled 2016-04-07: qty 1

## 2016-04-07 MED ORDER — HEPARIN SODIUM (PORCINE) 1000 UNIT/ML IJ SOLN
INTRAMUSCULAR | Status: AC
  Filled 2016-04-07: qty 1

## 2016-04-07 MED ORDER — LIDOCAINE HCL (PF) 1 % IJ SOLN
5.0000 mL | INTRAMUSCULAR | Status: DC | PRN
Start: 2016-04-07 — End: 2016-04-07

## 2016-04-07 MED ORDER — CEFAZOLIN SODIUM-DEXTROSE 2-4 GM/100ML-% IV SOLN
INTRAVENOUS | Status: AC
  Filled 2016-04-07: qty 100

## 2016-04-07 SURGICAL SUPPLY — 41 items
BAG DECANTER FOR FLEXI CONT (MISCELLANEOUS) ×4 IMPLANT
BIOPATCH RED 1 DISK 7.0 (GAUZE/BANDAGES/DRESSINGS) ×6 IMPLANT
BIOPATCH RED 1IN DISK 7.0MM (GAUZE/BANDAGES/DRESSINGS) ×2
CATH PALINDROME RT-P 15FX19CM (CATHETERS) ×4 IMPLANT
CATH PALINDROME RT-P 15FX23CM (CATHETERS) IMPLANT
CATH PALINDROME RT-P 15FX28CM (CATHETERS) IMPLANT
CATH PALINDROME RT-P 15FX55CM (CATHETERS) IMPLANT
COVER PROBE W GEL 5X96 (DRAPES) ×4 IMPLANT
COVER SURGICAL LIGHT HANDLE (MISCELLANEOUS) ×4 IMPLANT
DERMABOND ADVANCED (GAUZE/BANDAGES/DRESSINGS) ×2
DERMABOND ADVANCED .7 DNX12 (GAUZE/BANDAGES/DRESSINGS) ×2 IMPLANT
DRAPE C-ARM 42X72 X-RAY (DRAPES) ×4 IMPLANT
DRAPE CHEST BREAST 15X10 FENES (DRAPES) ×4 IMPLANT
GAUZE SPONGE 2X2 8PLY STRL LF (GAUZE/BANDAGES/DRESSINGS) IMPLANT
GAUZE SPONGE 4X4 16PLY XRAY LF (GAUZE/BANDAGES/DRESSINGS) ×4 IMPLANT
GLOVE BIO SURGEON STRL SZ7.5 (GLOVE) ×4 IMPLANT
GLOVE BIOGEL PI IND STRL 7.5 (GLOVE) ×2 IMPLANT
GLOVE BIOGEL PI INDICATOR 7.5 (GLOVE) ×2
GOWN STRL REUS W/ TWL LRG LVL3 (GOWN DISPOSABLE) ×2 IMPLANT
GOWN STRL REUS W/ TWL XL LVL3 (GOWN DISPOSABLE) ×2 IMPLANT
GOWN STRL REUS W/TWL LRG LVL3 (GOWN DISPOSABLE) ×2
GOWN STRL REUS W/TWL XL LVL3 (GOWN DISPOSABLE) ×2
KIT BASIN OR (CUSTOM PROCEDURE TRAY) ×4 IMPLANT
KIT ROOM TURNOVER OR (KITS) ×4 IMPLANT
NEEDLE 18GX1X1/2 (RX/OR ONLY) (NEEDLE) ×4 IMPLANT
NEEDLE HYPO 25GX1X1/2 BEV (NEEDLE) ×4 IMPLANT
NS IRRIG 1000ML POUR BTL (IV SOLUTION) ×4 IMPLANT
PACK SURGICAL SETUP 50X90 (CUSTOM PROCEDURE TRAY) ×4 IMPLANT
PAD ARMBOARD 7.5X6 YLW CONV (MISCELLANEOUS) ×8 IMPLANT
SOAP 2 % CHG 4 OZ (WOUND CARE) ×4 IMPLANT
SPONGE GAUZE 2X2 STER 10/PKG (GAUZE/BANDAGES/DRESSINGS)
SUT ETHILON 3 0 PS 1 (SUTURE) ×4 IMPLANT
SUT MNCRL AB 4-0 PS2 18 (SUTURE) ×8 IMPLANT
SYR 10ML LL (SYRINGE) ×4 IMPLANT
SYR 20CC LL (SYRINGE) ×8 IMPLANT
SYR 5ML LL (SYRINGE) ×4 IMPLANT
SYR CONTROL 10ML LL (SYRINGE) ×4 IMPLANT
TAPE STRIPS DRAPE STRL (GAUZE/BANDAGES/DRESSINGS) ×4 IMPLANT
TOWEL OR 17X24 6PK STRL BLUE (TOWEL DISPOSABLE) ×4 IMPLANT
TOWEL OR 17X26 4PK STRL BLUE (TOWEL DISPOSABLE) ×4 IMPLANT
WATER STERILE IRR 1000ML POUR (IV SOLUTION) ×4 IMPLANT

## 2016-04-07 NOTE — Progress Notes (Signed)
Subjective: Interval History: has no complaint .  Objective: Vital signs in last 24 hours: Temp:  [98 F (36.7 C)-98.7 F (37.1 C)] 98 F (36.7 C) (04/03 1122) Pulse Rate:  [67-80] 68 (04/03 1140) Resp:  [10-19] 10 (04/03 1140) BP: (117-155)/(75-106) 123/84 (04/03 1140) SpO2:  [96 %-100 %] 96 % (04/03 1140) Weight:  [111.1 kg (244 lb 14.4 oz)] 111.1 kg (244 lb 14.4 oz) (04/03 0500) Weight change: 0.091 kg (3.2 oz)  Intake/Output from previous day: 04/02 0701 - 04/03 0700 In: 1180 [P.O.:1080; IV Piggyback:100] Out: 650 [Urine:650] Intake/Output this shift: Total I/O In: 400 [I.V.:400] Out: -   General appearance: alert, cooperative, no distress, moderately obese and slowed mentation Resp: diminished breath sounds bilaterally and rales bibasilar Chest wall: RIJ cath Cardio: S1, S2 normal and systolic murmur: holosystolic 2/6, blowing at apex GI: obese, pos bs, soft Extremities: edema 3+  Lab Results:  Recent Labs  04/06/16 0310 04/07/16 0411  WBC 5.6 5.5  HGB 7.7* 7.7*  HCT 23.3* 24.4*  PLT 290 298   BMET:  Recent Labs  04/06/16 0310 04/07/16 0411  NA 137 135  K 4.1 4.6  CL 107 108  CO2 21* 20*  GLUCOSE 101* 89  BUN 40* 40*  CREATININE 5.70* 5.58*  CALCIUM 8.0* 8.1*   No results for input(s): PTH in the last 72 hours. Iron Studies:  Recent Labs  04/06/16 1507  IRON 63  TIBC 206*    Studies/Results: Dg Chest Port 1 View  Result Date: 04/07/2016 CLINICAL DATA:  Dialysis catheter placement EXAM: PORTABLE CHEST 1 VIEW COMPARISON:  04/01/2016 FINDINGS: 1107 hours. Stable asymmetric elevation right hemidiaphragm. Lungs are clear. The cardio pericardial silhouette is enlarged. Right IJ dialysis catheter is new in the interval with distal tip overlying the upper right atrium. No evidence for right-sided pneumothorax. Telemetry leads overlie the chest. IMPRESSION: No pneumothorax status post right IJ dialysis catheter placement. Electronically Signed   By:  Misty Stanley M.D.   On: 04/07/2016 11:21    I have reviewed the patient's current medications.  Assessment/Plan: 1 ESRD willdo 1st HD today.  Vol xs, cath now, fistula did not work . willneed perm access this week. 2 Anemia esa, Fe 3 HTN lower vol , meds 4 HPTH vit D 5 Obesity 6 DM P HD, esa, vit D, access    LOS: 5 days   Patricia [name] L 04/07/2016,12:25 PM

## 2016-04-07 NOTE — Anesthesia Preprocedure Evaluation (Addendum)
Anesthesia Evaluation  Patient identified by MRN, date of birth, ID band Patient awake    Reviewed: Allergy & Precautions, NPO status , Patient's Chart, lab work & pertinent test results, reviewed documented beta blocker date and time   Airway Mallampati: II  TM Distance: >3 FB Neck ROM: Full    Dental  (+) Teeth Intact, Dental Advisory Given   Pulmonary neg pulmonary ROS,    Pulmonary exam normal breath sounds clear to auscultation       Cardiovascular hypertension, Pt. on medications and Pt. on home beta blockers Normal cardiovascular exam Rhythm:Regular Rate:Normal     Neuro/Psych  Headaches, negative psych ROS   GI/Hepatic Neg liver ROS, GERD  Medicated,  Endo/Other  diabetes, Type 2Obesity   Renal/GU ESRF and DialysisRenal diseaseMinimal change glomerulonephritis     Musculoskeletal negative musculoskeletal ROS (+)   Abdominal   Peds  Hematology  (+) Blood dyscrasia, anemia ,   Anesthesia Other Findings Day of surgery medications reviewed with the patient.  Reproductive/Obstetrics                             Anesthesia Physical Anesthesia Plan  ASA: III  Anesthesia Plan: MAC   Post-op Pain Management:    Induction: Intravenous  Airway Management Planned: Nasal Cannula  Additional Equipment:   Intra-op Plan:   Post-operative Plan:   Informed Consent: I have reviewed the patients History and Physical, chart, labs and discussed the procedure including the risks, benefits and alternatives for the proposed anesthesia with the patient or authorized representative who has indicated his/her understanding and acceptance.   Dental advisory given  Plan Discussed with: CRNA and Anesthesiologist  Anesthesia Plan Comments: (Discussed risks/benefits/alternatives to MAC sedation including need for ventilatory support, hypotension, need for conversion to general anesthesia.  All patient  questions answered.  Patient/guardian wishes to proceed.)        Anesthesia Quick Evaluation

## 2016-04-07 NOTE — Progress Notes (Signed)
  Progress Note    04/07/2016 8:26 AM Day of Surgery  Subjective:  No acute issues  Vitals:   04/06/16 1900 04/07/16 0500  BP: 137/75 134/79  Pulse: 75 80  Resp: 16 16  Temp: 98.7 F (37.1 C) 98.1 F (36.7 C)    Physical Exam: aaox3 Non labored respirations Abdomen is soft Left arm with 2+ radial pulse Thrill in left forearm  CBC    Component Value Date/Time   WBC 5.5 04/07/2016 0411   RBC 3.23 (L) 04/07/2016 0411   HGB 7.7 (L) 04/07/2016 0411   HCT 24.4 (L) 04/07/2016 0411   PLT 298 04/07/2016 0411   MCV 75.5 (L) 04/07/2016 0411   MCH 23.8 (L) 04/07/2016 0411   MCHC 31.6 04/07/2016 0411   RDW 16.6 (H) 04/07/2016 0411   LYMPHSABS 2.3 04/02/2016 0413   MONOABS 0.4 04/02/2016 0413   EOSABS 0.2 04/02/2016 0413   BASOSABS 0.0 04/02/2016 0413    BMET    Component Value Date/Time   NA 135 04/07/2016 0411   K 4.6 04/07/2016 0411   CL 108 04/07/2016 0411   CO2 20 (L) 04/07/2016 0411   GLUCOSE 89 04/07/2016 0411   BUN 40 (H) 04/07/2016 0411   CREATININE 5.58 (H) 04/07/2016 0411   CALCIUM 8.1 (L) 04/07/2016 0411   GFRNONAA 9 (L) 04/07/2016 0411   GFRAA 10 (L) 04/07/2016 0411    INR    Component Value Date/Time   INR 0.98 06/18/2015 0113     Intake/Output Summary (Last 24 hours) at 04/07/16 0826 Last data filed at 04/07/16 0500  Gross per 24 hour  Intake             1180 ml  Output              650 ml  Net              530 ml     Assessment:  41 y.o. female is in need of hd, left arm fistula infiltrated and cannot be used.  Plan: tdc and left arm fistulogram to evaluate fistula. Discussed risks of bleeding, infection, catheter malfunction, pneumothorax and anesthetic complications. Daughters are reachable by phone in case of emergency. Consent signed and left arm marked.    Giuliana Handyside C. Donzetta Matters, MD [facility] Office: [phone] Pager: [phone]  04/07/2016 8:26 AM

## 2016-04-07 NOTE — Op Note (Signed)
    Patient name: Chidera Dearcos MRN: [id number] DOB: 09/21/1975 Sex: female  04/07/2016 Pre-operative Diagnosis: esrd, non-functioning left arm avf Post-operative diagnosis:  Same Surgeon:  Eda Paschal. Donzetta Matters, MD Procedure Performed: 1.  Right IJ tunneled dialysis catheter placement  2.  Left arm fistulagram  Indications:  41 year old female now in need of dialysis and has left arm AV fistula that is difficult to cannulate and had infiltration of the event. She is now indicated for catheter placement and evaluation of the AV fistula.  Findings: Right internal jugular vein was large and compressible at completion of catheter placement flushed and withdrew blood easily terminated in the SVC.  The left arm fistula that was deep from the skin and had multiple branches below the level of the antecubitum. Centrally there were no obstructions.  She will need conversion to a left upper arm fistula and that too may require superficialization.   Procedure:  The patient was identified in the holding area and taken to The operating room where she was placed supine on the operating table Mac anesthesia induced she was given antibiotics and sterilely prepped and draped in usual fashion for a right neck catheter placement as well as left forearm fistulagram. We began with ultrasound-guided evaluation of her right internal jugular vein. Then instilled 10 mL of 1% lidocaine in her right neck and chest. The vein was cannulated with 18-gauge needle under ultrasound guidance and wire passed into the IVC under fluoroscopic guidance. An incision was made in the neck and the counter incision on the chest. I then tunneled a 19 cm catheter between the 2 incisions. The wire tract was dilated and introducer sheath placed under fluoroscopic guidance. The 19 cm catheter was then introduced with a termination in the superior vena cava. It was assembled and flushed and withdrew blood easily and locked with heparin. It was affixed  to the skin with 3-0 nylon suture and the neck incision was closed with 4 Monocryl and Dermabond placed at both sites and sterile dressing applied.  Then turned our attention to the left forearm. Ultrasound guidance was used to identify the fistula and 3 mL of 1% lidocaine were instilled. I then cannulated the fistula with a 21-gauge needle placed a wire and exchanged for micropuncture sheath. A performed fistulogram with central venous runoff and attempted a retrograde angiogram although pictures were inadequate. There were multiple branches below the antecubitum without any central venous stenosis. This time elected that patient would need a conversion to an upper arm fistula. A 4-0 Monocryl figure of eight stitch was placed and the micro-sheath removed and pressure held. Patient tolerated procedure well without immediate complication.  Contrast dye 25 mL.   Inga Noller C. Donzetta Matters, MD [facility] [place] Office: [phone] Pager: [phone]

## 2016-04-07 NOTE — Progress Notes (Signed)
Received report on this patient at this time.  Pt recently left for HD per report.

## 2016-04-07 NOTE — Transfer of Care (Signed)
Immediate Anesthesia Transfer of Care Note  Patient: Patricia [name]  Procedure(s) Performed: Procedure(s): INSERTION OF DIALYSIS CATHETER (Right) FISTULOGRAM (Left)  Patient Location: PACU  Anesthesia Type:MAC  Level of Consciousness: awake, alert , oriented and patient cooperative  Airway & Oxygen Therapy: Patient Spontanous Breathing and Patient connected to face mask oxygen  Post-op Assessment: Report given to RN and Post -op Vital signs reviewed and stable  Post vital signs: Reviewed and stable  Last Vitals:  Vitals:   04/06/16 1900 04/07/16 0500  BP: 137/75 134/79  Pulse: 75 80  Resp: 16 16  Temp: 37.1 C 36.7 C    Last Pain:  Vitals:   04/07/16 0500  TempSrc: Oral  PainSc:       Patients Stated Pain Goal: 0 (10/62/69 4854)  Complications: No apparent anesthesia complications

## 2016-04-07 NOTE — Progress Notes (Signed)
PROGRESS NOTE    Patricia [name]  WUJ:[id number] DOB: 1975-11-16 DOA: 04/01/2016 PCP: Hillis Range   Outpatient Specialists:    Subjective: Seen after her dialysis catheter placed in, complains about pain around HD catheter placed. Needs set up for outpatient dialysis, also need permanent access as the fistula she has right now does not work.  Brief Narrative:  Patricia [name] is a 41 y.o. 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 Patient notes headache symptoms started on 3 days ago. Headache is global and patient reports felt like a "hard" pain. Complains of sensitivity to light. Tried using BC Goody powder and Tylenol without relief of symptoms. Then yesterday night c/o of intermittent right-sided chest pain that radiated to her back. During this time was blood pressure was elevated as high as 190/108. Her nephrologist's take a additional amlodipine '5mg'$  which she did without complete relief of symptoms. Associated complaints include nausea, vomiting for at least the last 2 days, decreased appetite, diaphoresis, blurry vision, and shortness of breath with exertion. Denies any palpitations, fever, loss of consciousness, diarrhea, constipation, rash, or focal weakness. She notes that some of these symptoms have been present since her kidney function started to decline. Her nephrologist is Dr. Francis Dowse at [facility]. Patient already has a left-sided fistula in place that is matured. She still makes a small amount of urine at this time. The last few months she's had at least 2 admissions for issues with chest pain and renal failure, but still has not been started on dialysis yet. She just recently had a nuclear stress tests 8 days ago at [facility] that was noted to be negative for any acute signs of ischemia.   Assessment & Plan:   Principal Problem:   Chest pain Active  Problems:   HTN (hypertension)   DM (diabetes mellitus) (HCC)   Hypercholesterolemia   Nausea and vomiting   ESRD (end stage renal disease) (HCC)   Anemia due to chronic kidney disease   GERD (gastroesophageal reflux disease)   HTN urgency -Isn't with blood pressure of 180/101, patient reports she had nausea and vomiting probably was vomiting her BP meds -On metoprolol and Norvasc, increased. -Metoprolol discontinued, started on labetalol and Norvasc dose increased to 10 mg. -Blood pressure with better control after adjustment of medications. Currently on labetalol and Norvasc.  CKD stage V -Secondary to minimal change disease, not on HD, but had her fistula ready for dialysis in the left upper extremity. -Nephrology evaluated the patient, she will be on a leukocytosis for now. -The left upper extremity fistula and does not work, had dialysis catheter placed by vascular surgery. -Needs permanent access and CLIP process before discharge.  Anemia -Anemia of CKD, hemoglobin is 7.7, per patient she is on EPO as outpatient. -Missed her dose last week, well restart EPO.  DM -Appears to be very controlled with diabetic diet. -No recent A1c, repeat  N/V -Treated symptomatically, likely this is what caused hypertension as she was vomiting her pressure medications. -No loss of consciousness or continuous headache to suggest stress.  Hypomagnesemia -his is repleted with IV supplements magnesium is 1.8.    DVT prophylaxis: SQ Heparin Code Status:Full Code Family Communication: Disposition Plan: CLIP and permanent access before DC     Objective: Vitals:   04/07/16 1100 04/07/16 1115 04/07/16 1122 04/07/16 1140  BP: 133/82 (!) 117/106 139/85 123/84  Pulse: 67 72 70 68  Resp: $Remo'19 13 15 10  'YvedI$ Temp:   98 F (36.7 C)   TempSrc:      SpO2: 97% 96% 99% 96%  Weight:      Height:        Intake/Output Summary (Last 24 hours) at 04/07/16 1349 Last data filed at 04/07/16 1300   Gross per 24 hour  Intake             1410 ml  Output              650 ml  Net              760 ml   Filed Weights   04/05/16 0415 04/06/16 0507 04/07/16 0500  Weight: 110.4 kg (243 lb 4.8 oz) 111 kg (244 lb 11.2 oz) 111.1 kg (244 lb 14.4 oz)    Examination:  General exam: appears uncomfortable Respiratory system: Clear to auscultation. Respiratory effort normal. Cardiovascular system: S1 & S2 heard, RRR. No JVD, murmurs, rubs, gallops or clicks. No pedal edema. Gastrointestinal system: Abdomen is nondistended, soft and nontender. No organomegaly or masses felt. Normal bowel sounds heard. Central nervous system: Alert and oriented. No focal neurological deficits.    Data Reviewed: I have personally reviewed following labs and imaging studies  CBC:  Recent Labs Lab 04/01/16 1605 04/02/16 0413 04/03/16 0417 04/06/16 0310 04/07/16 0411  WBC 5.3 5.8 6.1 5.6 5.5  NEUTROABS  --  2.8  --   --   --   HGB 9.1* 8.2* 8.6* 7.7* 7.7*  HCT 28.5* 25.9* 27.2* 23.3* 24.4*  MCV 76.2* 76.0* 76.4* 76.1* 75.5*  PLT 325 286 326 290 676   Basic Metabolic Panel:  Recent Labs Lab 04/02/16 0413 04/03/16 0417 04/04/16 0319 04/05/16 0410 04/06/16 0310 04/07/16 0411  NA 137 139 140 136 137 135  K 4.0 4.2 4.3 4.1 4.1 4.6  CL 111 110 111 108 107 108  CO2 17* 18* 19* 19* 21* 20*  GLUCOSE 104* 90 107* 89 101* 89  BUN 37* 32* 37* 33* 40* 40*  CREATININE 4.75* 4.86* 5.03* 5.15* 5.70* 5.58*  CALCIUM 7.1* 7.8* 8.1* 8.0* 8.0* 8.1*  MG 1.2*  --  1.8  --   --   --   PHOS  --   --  5.6* 5.6* 4.6 4.8*   GFR: Estimated Creatinine Clearance: 16.9 mL/min (A) (by C-G formula based on SCr of 5.58 mg/dL (H)). Liver Function Tests:  Recent Labs Lab 04/02/16 0413 04/04/16 0319 04/05/16 0410 04/06/16 0310 04/07/16 0411  AST 12*  --   --   --   --   ALT 10*  --   --   --   --   ALKPHOS 33*  --   --   --   --   BILITOT 0.3  --   --   --   --   PROT 5.0*  --   --   --   --   ALBUMIN 2.0* 1.9*  1.9* 1.8* 1.9*   No results for input(s): LIPASE, AMYLASE in the last 168 hours. No results for input(s): AMMONIA in the last 168 hours. Coagulation Profile: No results for input(s): INR, PROTIME in the last 168 hours. Cardiac Enzymes:  Recent Labs Lab 04/01/16 2253 04/02/16 0413 04/02/16 0933  TROPONINI <0.03 <0.03 <0.03   BNP (last 3 results) No results for input(s): PROBNP in the last 8760 hours. HbA1C:  Recent Labs  04/05/16 1300  HGBA1C  5.9*   CBG:  Recent Labs Lab 04/07/16 1031  GLUCAP 97   Lipid Profile: No results for input(s): CHOL, HDL, LDLCALC, TRIG, CHOLHDL, LDLDIRECT in the last 72 hours. Thyroid Function Tests: No results for input(s): TSH, T4TOTAL, FREET4, T3FREE, THYROIDAB in the last 72 hours. Anemia Panel:  Recent Labs  04/06/16 1507  TIBC 206*  IRON 63   Urine analysis:    Component Value Date/Time   COLORURINE YELLOW 04/03/2016 1626   APPEARANCEUR HAZY (A) 04/03/2016 1626   LABSPEC 1.012 04/03/2016 1626   PHURINE 6.0 04/03/2016 1626   GLUCOSEU 150 (A) 04/03/2016 1626   HGBUR NEGATIVE 04/03/2016 1626   BILIRUBINUR NEGATIVE 04/03/2016 1626   KETONESUR NEGATIVE 04/03/2016 1626   PROTEINUR >=300 (A) 04/03/2016 1626   UROBILINOGEN 0.2 09/19/2014 1620   NITRITE NEGATIVE 04/03/2016 1626   LEUKOCYTESUR NEGATIVE 04/03/2016 1626    ) Recent Results (from the past 240 hour(s))  Surgical pcr screen     Status: None   Collection Time: 04/07/16  7:50 AM  Result Value Ref Range Status   MRSA, PCR NEGATIVE NEGATIVE Final   Staphylococcus aureus NEGATIVE NEGATIVE Final    Comment:        The Xpert SA Assay (FDA approved for NASAL specimens in patients over 97 years of age), is one component of a comprehensive surveillance program.  Test performance has been validated by [facility] for patients greater than or equal to 44 year old. It is not intended to diagnose infection nor to guide or monitor treatment.       Anti-infectives     Start     Dose/Rate Route Frequency Ordered Stop   04/07/16 0830  ceFAZolin (ANCEF) IVPB 2g/100 mL premix     2 g 200 mL/hr over 30 Minutes Intravenous  Once 04/07/16 0827 04/07/16 0929   04/07/16 0828  ceFAZolin (ANCEF) 2-4 GM/100ML-% IVPB    Comments:  Forte, Lindsi   : cabinet override      04/07/16 8675 04/07/16 0929       Radiology Studies: Dg Chest Port 1 View  Result Date: 04/07/2016 CLINICAL DATA:  Dialysis catheter placement EXAM: PORTABLE CHEST 1 VIEW COMPARISON:  04/01/2016 FINDINGS: 1107 hours. Stable asymmetric elevation right hemidiaphragm. Lungs are clear. The cardio pericardial silhouette is enlarged. Right IJ dialysis catheter is new in the interval with distal tip overlying the upper right atrium. No evidence for right-sided pneumothorax. Telemetry leads overlie the chest. IMPRESSION: No pneumothorax status post right IJ dialysis catheter placement. Electronically Signed   By: Misty Stanley M.D.   On: 04/07/2016 11:21        Scheduled Meds: . allopurinol  100 mg Oral Daily  . amLODipine  10 mg Oral QHS  . aspirin  81 mg Oral Daily  . atorvastatin  40 mg Oral Daily  . calcitRIOL  0.25 mcg Oral Q M,W,F  . calcium acetate  1,334 mg Oral TID WC  . cholecalciferol  400 Units Oral Daily  . darbepoetin (ARANESP) injection - DIALYSIS  200 mcg Intravenous Q Tue-HD  . heparin  40 Units/kg Dialysis Once in dialysis  . heparin  5,000 Units Subcutaneous Q8H  . multivitamin with minerals  1 tablet Oral Daily  . pantoprazole  40 mg Oral Daily  . potassium chloride  10 mEq Oral Daily  . sertraline  50 mg Oral Daily   Continuous Infusions: . sodium chloride 10 mL/hr at 04/07/16 0829     LOS: 5 days    Time  spent: 25 min    [facility] Pager [phone]  If 7PM-7AM, please contact night-coverage [email or web address] Password [email or web address] 04/07/2016, 1:49 PM

## 2016-04-07 NOTE — Procedures (Signed)
I was present at this session.  I have reviewed the session itself and made appropriate changes.  HD via PC, slow flows, 1 st HD.  Oday Ridings L 4/3/20183:55 PM

## 2016-04-08 ENCOUNTER — Encounter (HOSPITAL_COMMUNITY): Payer: Self-pay | Admitting: Vascular Surgery

## 2016-04-08 LAB — RENAL FUNCTION PANEL
ANION GAP: 7 (ref 5–15)
Albumin: 2 g/dL — ABNORMAL LOW (ref 3.5–5.0)
BUN: 27 mg/dL — ABNORMAL HIGH (ref 6–20)
CHLORIDE: 102 mmol/L (ref 101–111)
CO2: 25 mmol/L (ref 22–32)
Calcium: 7.7 mg/dL — ABNORMAL LOW (ref 8.9–10.3)
Creatinine, Ser: 4.4 mg/dL — ABNORMAL HIGH (ref 0.44–1.00)
GFR, EST AFRICAN AMERICAN: 13 mL/min — AB (ref >60)
GFR, EST NON AFRICAN AMERICAN: 11 mL/min — AB (ref >60)
Glucose, Bld: 105 mg/dL — ABNORMAL HIGH (ref 65–99)
POTASSIUM: 4.4 mmol/L (ref 3.5–5.1)
Phosphorus: 4.8 mg/dL — ABNORMAL HIGH (ref 2.5–4.6)
Sodium: 134 mmol/L — ABNORMAL LOW (ref 135–145)

## 2016-04-08 LAB — CBC
HEMATOCRIT: 23.8 % — AB (ref 36.0–46.0)
HEMOGLOBIN: 7.8 g/dL — AB (ref 12.0–15.0)
MCH: 25 pg — AB (ref 26.0–34.0)
MCHC: 32.8 g/dL (ref 30.0–36.0)
MCV: 76.3 fL — AB (ref 78.0–100.0)
PLATELETS: 241 10*3/uL (ref 150–400)
RBC: 3.12 MIL/uL — AB (ref 3.87–5.11)
RDW: 17.5 % — ABNORMAL HIGH (ref 11.5–15.5)
WBC: 4.3 10*3/uL (ref 4.0–10.5)

## 2016-04-08 LAB — PARATHYROID HORMONE, INTACT (NO CA): PTH: 160 pg/mL — ABNORMAL HIGH (ref 15–65)

## 2016-04-08 MED ORDER — AMLODIPINE BESYLATE 5 MG PO TABS
5.0000 mg | ORAL_TABLET | Freq: Every day | ORAL | Status: DC
  Administered 2016-04-08 – 2016-04-11 (×4): 5 mg via ORAL
  Filled 2016-04-08 (×4): qty 1

## 2016-04-08 MED ORDER — ONDANSETRON HCL 4 MG/2ML IJ SOLN
INTRAMUSCULAR | Status: AC
  Filled 2016-04-08: qty 2

## 2016-04-08 NOTE — Progress Notes (Signed)
  Progress Note    04/08/2016 7:53 AM 1 Day Post-Op  Subjective:  No acute issues  Vitals:   04/07/16 2118 04/08/16 0530  BP: 137/81 129/74  Pulse: 93 76  Resp: 10 (!) 21  Temp: 98.1 F (36.7 C) 98.6 F (37 C)    Physical Exam: aaox3 Right chest tdc in place without hematoma Left hand is warm, palpable thrill in avf  CBC    Component Value Date/Time   WBC 5.5 04/07/2016 0411   RBC 3.23 (L) 04/07/2016 0411   HGB 7.7 (L) 04/07/2016 0411   HCT 24.4 (L) 04/07/2016 0411   PLT 298 04/07/2016 0411   MCV 75.5 (L) 04/07/2016 0411   MCH 23.8 (L) 04/07/2016 0411   MCHC 31.6 04/07/2016 0411   RDW 16.6 (H) 04/07/2016 0411   LYMPHSABS 2.3 04/02/2016 0413   MONOABS 0.4 04/02/2016 0413   EOSABS 0.2 04/02/2016 0413   BASOSABS 0.0 04/02/2016 0413    BMET    Component Value Date/Time   NA 134 (L) 04/08/2016 0500   K 4.4 04/08/2016 0500   CL 102 04/08/2016 0500   CO2 25 04/08/2016 0500   GLUCOSE 105 (H) 04/08/2016 0500   BUN 27 (H) 04/08/2016 0500   CREATININE 4.40 (H) 04/08/2016 0500   CALCIUM 7.7 (L) 04/08/2016 0500   GFRNONAA 11 (L) 04/08/2016 0500   GFRAA 13 (L) 04/08/2016 0500    INR    Component Value Date/Time   INR 0.98 06/18/2015 0113     Intake/Output Summary (Last 24 hours) at 04/08/16 0753 Last data filed at 04/07/16 1811  Gross per 24 hour  Intake              640 ml  Output             2000 ml  Net            -1360 ml     Assessment/plan:  41 y.o. female is s/p tdc placement and fistulogram. The avf is palpable with recent infiltration event on hd. Intraoperative evaluation with ultrasound demonstrated a deep fistula in the forearm that will be very difficult for hd nursing to cannulate. Furthermore, the avf branches into the basilic and deep system just below the antecubitum. For these reasons she will need conversion to an upper arm avf and quite possibly a subsequent superficialization given the size of her upper arm. We will schedule her on a  non-dialysis day in the near future likely as outpatient.      Brandon C. Donzetta Matters, MD [facility] Office: [phone] Pager: [phone]  04/08/2016 7:53 AM

## 2016-04-08 NOTE — Progress Notes (Signed)
Subjective: Interval History: has no complaint, just waiting to get perm access done..  Objective: Vital signs in last 24 hours: Temp:  [97.6 F (36.4 C)-98.6 F (37 C)] 97.9 F (36.6 C) (04/04 0810) Pulse Rate:  [66-93] 74 (04/04 0830) Resp:  [10-21] 21 (04/04 0530) BP: (108-150)/(68-106) 117/73 (04/04 0830) SpO2:  [95 %-100 %] 99 % (04/04 0810) Weight:  [108 kg (238 lb 1.6 oz)-111 kg (244 lb 11.4 oz)] 108.4 kg (238 lb 15.7 oz) (04/04 0810) Weight change: -0.086 kg (-3 oz)  Intake/Output from previous day: 04/03 0701 - 04/04 0700 In: 640 [P.O.:240; I.V.:400] Out: 2000  Intake/Output this shift: No intake/output data recorded.  General appearance: alert, cooperative, moderately obese and pale Resp: clear to auscultation bilaterally Chest wall: IJ cath Cardio: S1, S2 normal and systolic murmur: systolic ejection 2/6, decrescendo at 2nd left intercostal space GI: obese, pos bs, liver down 5 cm Extremities: edema 2+  Lab Results:  Recent Labs  04/07/16 0411 04/08/16 0828  WBC 5.5 4.3  HGB 7.7* 7.8*  HCT 24.4* 23.8*  PLT 298 241   BMET:  Recent Labs  04/07/16 0411 04/08/16 0500  NA 135 134*  K 4.6 4.4  CL 108 102  CO2 20* 25  GLUCOSE 89 105*  BUN 40* 27*  CREATININE 5.58* 4.40*  CALCIUM 8.1* 7.7*    Recent Labs  04/06/16 1745  PTH 160*   Iron Studies:  Recent Labs  04/06/16 1507  IRON 63  TIBC 206*    Studies/Results: Dg Chest Port 1 View  Result Date: 04/07/2016 CLINICAL DATA:  Dialysis catheter placement EXAM: PORTABLE CHEST 1 VIEW COMPARISON:  04/01/2016 FINDINGS: 1107 hours. Stable asymmetric elevation right hemidiaphragm. Lungs are clear. The cardio pericardial silhouette is enlarged. Right IJ dialysis catheter is new in the interval with distal tip overlying the upper right atrium. No evidence for right-sided pneumothorax. Telemetry leads overlie the chest. IMPRESSION: No pneumothorax status post right IJ dialysis catheter placement.  Electronically Signed   By: Misty Stanley M.D.   On: 04/07/2016 11:21    I have reviewed the patient's current medications.  Assessment/Plan: 1 ESRD 2nd HD ,lower solute/vol.  Will do next on Fri. Awaiting perm access prior to accepting as outpatient 2 Anemia esa,Feok 3 HPTH vit D 4 HTN lower meds with lower vol 5 DM P HD, esa, access    LOS: 6 days   Claudell Wohler L 04/08/2016,9:02 AM

## 2016-04-08 NOTE — Anesthesia Postprocedure Evaluation (Signed)
Anesthesia Post Note  Patient: Patricia [name]  Procedure(s) Performed: Procedure(s) (LRB): INSERTION OF DIALYSIS CATHETER (Right) FISTULOGRAM (Left)  Patient location during evaluation: PACU Anesthesia Type: MAC Level of consciousness: awake and alert Pain management: pain level controlled Vital Signs Assessment: post-procedure vital signs reviewed and stable Respiratory status: spontaneous breathing, nonlabored ventilation, respiratory function stable and patient connected to nasal cannula oxygen Cardiovascular status: stable and blood pressure returned to baseline Anesthetic complications: no       Last Vitals:  Vitals:   04/08/16 1030 04/08/16 1100  BP: 136/81 126/75  Pulse: 66 83  Resp:    Temp:      Last Pain:  Vitals:   04/08/16 0810  TempSrc: Oral  PainSc: [name]

## 2016-04-08 NOTE — Procedures (Signed)
I was present at this session.  I have reviewed the session itself and made appropriate changes.  HD via PC , low flows and short with 2nd HD, tol vol off.    Delmon Andrada L 4/4/20189:04 AM

## 2016-04-08 NOTE — Progress Notes (Signed)
PROGRESS NOTE    Patricia [name]  DXA:[id number] DOB: 1975-04-11 DOA: 04/01/2016 PCP: Tereasa Coop, PA-C   Outpatient Specialists:    Subjective: Had a drop of BP on dialysis today and got very dizzy  Brief Narrative:  Patricia [name] is a 41 y.o. 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  She just recently had a nuclear stress tests 8 days ago at [facility] that was noted to be negative for any acute signs of ischemia.  HA and CP thought to be due to HTN urgency/emergency.  BP corrected but patient had very little urine output.  It was decided to start dialysis in the hospital. Patient's access was difficult and plan is for revision on Tuesday.  Patient also needs to be clipped.   Assessment & Plan:   Principal Problem:   Chest pain Active Problems:   HTN (hypertension)   DM (diabetes mellitus) (HCC)   Hypercholesterolemia   Nausea and vomiting   ESRD (end stage renal disease) (HCC)   Anemia due to chronic kidney disease   GERD (gastroesophageal reflux disease)   HTN urgency -presented with blood pressure of 180/101, patient reports she had nausea and vomiting and probably was vomiting her BP meds -Blood pressure medications will need to be adjusted with start of dialysis- dropped today  CKD stage V -Secondary to minimal change disease, not on HD, but had her fistula ready for dialysis in the left upper extremity. -The left upper extremity fistula and does not work, had dialysis catheter placed by vascular surgery. -Needs permanent access and CLIP process before discharge- plan for access by vascular on Tuesday  Anemia -Anemia of CKD, hemoglobin is 7.7, per patient she is on EPO as outpatient. -Missed her dose last week, well restart EPO.  DM -Appears to be very controlled with diabetic diet. -No recent A1c,  repeat  N/V -resolved  Hypomagnesemia -repleted    DVT prophylaxis: SQ Heparin Code Status:Full Code Family Communication: Disposition Plan: CLIP and permanent access before DC     Objective: Vitals:   04/08/16 1030 04/08/16 1100 04/08/16 1137 04/08/16 1147  BP: 136/81 126/75 108/72 134/82  Pulse: 66 83 83   Resp:   14   Temp:   97.9 F (36.6 C)   TempSrc:   Oral   SpO2:   100%   Weight:      Height:        Intake/Output Summary (Last 24 hours) at 04/08/16 1530 Last data filed at 04/08/16 1137  Gross per 24 hour  Intake                0 ml  Output             4400 ml  Net            -4400 ml   Filed Weights   04/07/16 1811 04/08/16 0530 04/08/16 0810  Weight: 109 kg (240 lb 4.8 oz) 108 kg (238 lb 1.6 oz) 108.4 kg (238 lb 15.7 oz)    Examination:  General exam: uncomfortable appearing- just back from dialysis Respiratory system: Clear to auscultation. Respiratory effort normal. Cardiovascular system: S1 & S2 heard, RRR. No JVD, murmurs, rubs, gallops or clicks. No pedal edema. Gastrointestinal system: Abdomen is nondistended, soft and nontender. No organomegaly or masses felt. Normal bowel sounds heard. Central nervous system: Alert and oriented. No focal neurological deficits.  Data Reviewed: I have personally reviewed following labs and imaging studies  CBC:  Recent Labs Lab 04/02/16 0413 04/03/16 0417 04/06/16 0310 04/07/16 0411 04/08/16 0828  WBC 5.8 6.1 5.6 5.5 4.3  NEUTROABS 2.8  --   --   --   --   HGB 8.2* 8.6* 7.7* 7.7* 7.8*  HCT 25.9* 27.2* 23.3* 24.4* 23.8*  MCV 76.0* 76.4* 76.1* 75.5* 76.3*  PLT 286 326 290 298 295   Basic Metabolic Panel:  Recent Labs Lab 04/02/16 0413  04/04/16 0319 04/05/16 0410 04/06/16 0310 04/07/16 0411 04/08/16 0500  NA 137  < > 140 136 137 135 134*  K 4.0  < > 4.3 4.1 4.1 4.6 4.4  CL 111  < > 111 108 107 108 102  CO2 17*  < > 19* 19* 21* 20* 25  GLUCOSE 104*  < > 107* 89 101* 89 105*  BUN 37*   < > 37* 33* 40* 40* 27*  CREATININE 4.75*  < > 5.03* 5.15* 5.70* 5.58* 4.40*  CALCIUM 7.1*  < > 8.1* 8.0* 8.0* 8.1* 7.7*  MG 1.2*  --  1.8  --   --   --   --   PHOS  --   --  5.6* 5.6* 4.6 4.8* 4.8*  < > = values in this interval not displayed. GFR: Estimated Creatinine Clearance: 21.1 mL/min (A) (by C-G formula based on SCr of 4.4 mg/dL (H)). Liver Function Tests:  Recent Labs Lab 04/02/16 0413 04/04/16 0319 04/05/16 0410 04/06/16 0310 04/07/16 0411 04/08/16 0500  AST 12*  --   --   --   --   --   ALT 10*  --   --   --   --   --   ALKPHOS 33*  --   --   --   --   --   BILITOT 0.3  --   --   --   --   --   PROT 5.0*  --   --   --   --   --   ALBUMIN 2.0* 1.9* 1.9* 1.8* 1.9* 2.0*   No results for input(s): LIPASE, AMYLASE in the last 168 hours. No results for input(s): AMMONIA in the last 168 hours. Coagulation Profile: No results for input(s): INR, PROTIME in the last 168 hours. Cardiac Enzymes:  Recent Labs Lab 04/01/16 2253 04/02/16 0413 04/02/16 0933  TROPONINI <0.03 <0.03 <0.03   BNP (last 3 results) No results for input(s): PROBNP in the last 8760 hours. HbA1C: No results for input(s): HGBA1C in the last 72 hours. CBG:  Recent Labs Lab 04/07/16 1031  GLUCAP 97   Lipid Profile: No results for input(s): CHOL, HDL, LDLCALC, TRIG, CHOLHDL, LDLDIRECT in the last 72 hours. Thyroid Function Tests: No results for input(s): TSH, T4TOTAL, FREET4, T3FREE, THYROIDAB in the last 72 hours. Anemia Panel:  Recent Labs  04/06/16 1507  TIBC 206*  IRON 63   Urine analysis:    Component Value Date/Time   COLORURINE YELLOW 04/03/2016 1626   APPEARANCEUR HAZY (A) 04/03/2016 1626   LABSPEC 1.012 04/03/2016 1626   PHURINE 6.0 04/03/2016 1626   GLUCOSEU 150 (A) 04/03/2016 1626   HGBUR NEGATIVE 04/03/2016 1626   BILIRUBINUR NEGATIVE 04/03/2016 1626   KETONESUR NEGATIVE 04/03/2016 1626   PROTEINUR >=300 (A) 04/03/2016 1626   UROBILINOGEN 0.2 09/19/2014 1620    NITRITE NEGATIVE 04/03/2016 1626   LEUKOCYTESUR NEGATIVE 04/03/2016 1626    ) Recent Results (from the past 240  hour(s))  Surgical pcr screen     Status: None   Collection Time: 04/07/16  7:50 AM  Result Value Ref Range Status   MRSA, PCR NEGATIVE NEGATIVE Final   Staphylococcus aureus NEGATIVE NEGATIVE Final    Comment:        The Xpert SA Assay (FDA approved for NASAL specimens in patients over 1 years of age), is one component of a comprehensive surveillance program.  Test performance has been validated by [facility] for patients greater than or equal to 93 year old. It is not intended to diagnose infection nor to guide or monitor treatment.       Anti-infectives    Start     Dose/Rate Route Frequency Ordered Stop   04/07/16 0830  ceFAZolin (ANCEF) IVPB 2g/100 mL premix     2 g 200 mL/hr over 30 Minutes Intravenous  Once 04/07/16 0827 04/07/16 0929   04/07/16 0828  ceFAZolin (ANCEF) 2-4 GM/100ML-% IVPB    Comments:  Forte, Lindsi   : cabinet override      04/07/16 4540 04/07/16 0929       Radiology Studies: Dg Chest Port 1 View  Result Date: 04/07/2016 CLINICAL DATA:  Dialysis catheter placement EXAM: PORTABLE CHEST 1 VIEW COMPARISON:  04/01/2016 FINDINGS: 1107 hours. Stable asymmetric elevation right hemidiaphragm. Lungs are clear. The cardio pericardial silhouette is enlarged. Right IJ dialysis catheter is new in the interval with distal tip overlying the upper right atrium. No evidence for right-sided pneumothorax. Telemetry leads overlie the chest. IMPRESSION: No pneumothorax status post right IJ dialysis catheter placement. Electronically Signed   By: Misty Stanley M.D.   On: 04/07/2016 11:21        Scheduled Meds: . allopurinol  100 mg Oral Daily  . amLODipine  5 mg Oral QHS  . aspirin  81 mg Oral Daily  . atorvastatin  40 mg Oral Daily  . calcitRIOL  0.25 mcg Oral Q M,W,F  . calcium acetate  1,334 mg Oral TID WC  . darbepoetin (ARANESP) injection -  DIALYSIS  200 mcg Intravenous Q Tue-HD  . heparin  5,000 Units Subcutaneous Q8H  . multivitamin with minerals  1 tablet Oral Daily  . pantoprazole  40 mg Oral Daily  . sertraline  50 mg Oral Daily   Continuous Infusions: . sodium chloride 10 mL/hr at 04/07/16 0829     LOS: 6 days    Time spent: 25 min    [facility] Pager [phone]  If 7PM-7AM, please contact night-coverage [email or web address] Password [email or web address] 04/08/2016, 3:30 PM

## 2016-04-09 NOTE — Progress Notes (Signed)
   Patient scheduled for next Tuesday with Dr. Scot Dock for conversion to left upper arm av fistula. Will need to be npo at midnight on Monday 4.9.18 if still inpatient.   Patricia [name]

## 2016-04-09 NOTE — Care Management Note (Addendum)
Case Management Note  Patient Details  Name: Patricia [name] MRN: [id number] Date of Birth: 1975/09/26  Subjective/Objective:   Chest pain, HTN, DM, ESRD, anemia                 Action/Plan: Discharge Planning: NCM spoke to pt at bedside. Active with [facility] for [facility] PT. Will need resumption of care order. Contacted [facility] Liaison to make aware. Pt considering RW for home. Requesting information on transportation to dialysis. CSW referral for transportation. Pt states she has adult dtr in the home but they work. States she has Medicaid. Will have dtr bring her Medicaid card.    PCP LONG, ASHLEY   Expected Discharge Date:                 Expected Discharge Plan:  [place]  In-House Referral:  Clinical Social Work  Discharge planning Services  CM Consult  Post Acute Care Choice:  Home Health, Resumption of Svcs/PTA Provider Choice offered to:  Patient  DME Arranged:    DME Agency:     HH Arranged:  PT HH Agency:  [facility]  Status of Service:  In process, will continue to follow  If discussed at Long Length of Stay Meetings, dates discussed: , 04/09/2016, 04/14/2016  Additional Comments:  Erenest Rasher, RN 04/09/2016, 4:24 PM

## 2016-04-09 NOTE — Progress Notes (Signed)
PROGRESS NOTE    Patricia [name]  UDJ:[id number] DOB: August 30, 1975 DOA: 04/01/2016 PCP: Hillis Range   Brief Narrative:  Patricia [name] is a 41 y.o. 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 She just recently had a nuclear stress tests 8 days ago at [facility] that was noted to be negative for any acute signs of ischemia. HA and CP thought to be due to HTN urgency/emergency. BP corrected but patient had very little urine output. It was decided to start dialysis in the hospital. Patient's access was difficult and plan is for revision on Tuesday. Patient also needs to be clipped.   Assessment & Plan:   Principal Problem:   Chest pain Active Problems:   HTN (hypertension)   DM (diabetes mellitus) (HCC)   Hypercholesterolemia   Nausea and vomiting   ESRD (end stage renal disease) (HCC)   Anemia due to chronic kidney disease   GERD (gastroesophageal reflux disease)   HTN urgency -presented with blood pressure of 180/101, patient reported she had nausea and vomiting and probably was vomiting her BP meds -Blood pressure medications will need to be adjusted with start of dialysis - Better. Blood pressures ranging in the 140s/80s.  CKD stage V/new ESRD -Secondary to minimal change disease, not on HD PTA, but had her fistula ready for dialysis in the left upper extremity. -The left upper extremity fistula does not work, had dialysis catheter placed by vascular surgery. - Needs permanent access and CLIP process before discharge- plan for access by vascular on Tuesday or earlier.  Anemia -Anemia of CKD, hemoglobin is 7.7, per patient she is on EPO as outpatient. -Missed her dose last week, well restart EPO.  - Stable in the 7.7-7.8 range.  DM -Appears to be very controlled with diabetic diet. -A1c: 5.9  N/V - Vomiting resolved but still has nausea.    Hypomagnesemia -repleted  Secondary hyperparathyroidism Per nephrology.  Obesity/Body mass index is 37.43 kg/m.   DVT prophylaxis: SQ Heparin Code Status:Full Code Family Communication: None at bedside.  Disposition Plan: CLIP and permanent access before DC, Probably next week.   Subjective:  Seen this morning. Complains of soreness at right upper chest HD catheter site but better compared to 2 days ago. Feels tired but no specific complaints.  ROS: No chest pain, dyspnea, lightheadedness or dizziness.   Objective: Vitals:   04/08/16 1300 04/08/16 2112 04/08/16 2151 04/09/16 0500  BP: 135/63 (!) 144/77 (!) 142/78 (!) 148/84  Pulse: 79  85 92  Resp: $Remo'16  16 15  'FpEHP$ Temp: 98.5 F (36.9 C) 98.1 F (36.7 C) 98.1 F (36.7 C) 98.2 F (36.8 C)  TempSrc:  Oral Oral Oral  SpO2: 96%  98% 96%  Weight:    108.4 kg (238 lb 15.7 oz)  Height:        Intake/Output Summary (Last 24 hours) at 04/09/16 1223 Last data filed at 04/08/16 1930  Gross per 24 hour  Intake              720 ml  Output                0 ml  Net              720 ml   Filed Weights   04/08/16 0810 04/08/16 1137 04/09/16 0500  Weight: 108.4 kg (238 lb 15.7 oz) 106.1 kg (233 lb  14.5 oz) 108.4 kg (238 lb 15.7 oz)    Examination:  General exam: Pleasant young female, moderately built and obese, lying comfortably supine in bed.  Respiratory system: Clear to auscultation. Respiratory effort normal. R IJ HD cath site looks clean and dry.  Cardiovascular system: S1 & S2 heard, RRR. No JVD, murmurs, rubs, gallops or clicks. No pedal edema. Gastrointestinal system: Abdomen is nondistended, soft and nontender. No organomegaly or masses felt. Normal bowel sounds heard. Central nervous system: Alert and oriented. No focal neurological deficits.    Data Reviewed: I have personally reviewed following labs and imaging studies  CBC:  Recent Labs Lab 04/03/16 0417 04/06/16 0310 04/07/16 0411 04/08/16 0828  WBC  6.1 5.6 5.5 4.3  HGB 8.6* 7.7* 7.7* 7.8*  HCT 27.2* 23.3* 24.4* 23.8*  MCV 76.4* 76.1* 75.5* 76.3*  PLT 326 290 298 865   Basic Metabolic Panel:  Recent Labs Lab 04/04/16 0319 04/05/16 0410 04/06/16 0310 04/07/16 0411 04/08/16 0500  NA 140 136 137 135 134*  K 4.3 4.1 4.1 4.6 4.4  CL 111 108 107 108 102  CO2 19* 19* 21* 20* 25  GLUCOSE 107* 89 101* 89 105*  BUN 37* 33* 40* 40* 27*  CREATININE 5.03* 5.15* 5.70* 5.58* 4.40*  CALCIUM 8.1* 8.0* 8.0* 8.1* 7.7*  MG 1.8  --   --   --   --   PHOS 5.6* 5.6* 4.6 4.8* 4.8*   GFR: Estimated Creatinine Clearance: 21.1 mL/min (A) (by C-G formula based on SCr of 4.4 mg/dL (H)). Liver Function Tests:  Recent Labs Lab 04/04/16 0319 04/05/16 0410 04/06/16 0310 04/07/16 0411 04/08/16 0500  ALBUMIN 1.9* 1.9* 1.8* 1.9* 2.0*   CBG:  Recent Labs Lab 04/07/16 1031  GLUCAP 97   Anemia Panel:  Recent Labs  04/06/16 1507  TIBC 206*  IRON 63   Urine analysis:    Component Value Date/Time   COLORURINE YELLOW 04/03/2016 1626   APPEARANCEUR HAZY (A) 04/03/2016 1626   LABSPEC 1.012 04/03/2016 1626   PHURINE 6.0 04/03/2016 1626   GLUCOSEU 150 (A) 04/03/2016 1626   HGBUR NEGATIVE 04/03/2016 1626   BILIRUBINUR NEGATIVE 04/03/2016 1626   KETONESUR NEGATIVE 04/03/2016 1626   PROTEINUR >=300 (A) 04/03/2016 1626   UROBILINOGEN 0.2 09/19/2014 1620   NITRITE NEGATIVE 04/03/2016 1626   LEUKOCYTESUR NEGATIVE 04/03/2016 1626    ) Recent Results (from the past 240 hour(s))  Surgical pcr screen     Status: None   Collection Time: 04/07/16  7:50 AM  Result Value Ref Range Status   MRSA, PCR NEGATIVE NEGATIVE Final   Staphylococcus aureus NEGATIVE NEGATIVE Final    Comment:        The Xpert SA Assay (FDA approved for NASAL specimens in patients over 41 years of age), is one component of a comprehensive surveillance program.  Test performance has been validated by [facility] for patients greater than or equal to 51 year old. It  is not intended to diagnose infection nor to guide or monitor treatment.       Anti-infectives    Start     Dose/Rate Route Frequency Ordered Stop   04/07/16 0830  ceFAZolin (ANCEF) IVPB 2g/100 mL premix     2 g 200 mL/hr over 30 Minutes Intravenous  Once 04/07/16 0827 04/07/16 0929   04/07/16 0828  ceFAZolin (ANCEF) 2-4 GM/100ML-% IVPB    Comments:  Forte, Lindsi   : cabinet override      04/07/16 0828 04/07/16 7846  Radiology Studies: No results found.      Scheduled Meds: . allopurinol  100 mg Oral Daily  . amLODipine  5 mg Oral QHS  . aspirin  81 mg Oral Daily  . atorvastatin  40 mg Oral Daily  . calcitRIOL  0.25 mcg Oral Q M,W,F  . calcium acetate  1,334 mg Oral TID WC  . darbepoetin (ARANESP) injection - DIALYSIS  200 mcg Intravenous Q Tue-HD  . heparin  5,000 Units Subcutaneous Q8H  . multivitamin with minerals  1 tablet Oral Daily  . pantoprazole  40 mg Oral Daily  . sertraline  50 mg Oral Daily   Continuous Infusions: . sodium chloride 10 mL/hr at 04/07/16 0829     LOS: 7 days    Time spent: 25 min    HONGALGI,ANAND, MD, FACP, FHM. [facility] Pager [phone]  If 7PM-7AM, please contact night-coverage [email or web address] Password [email or web address] 04/09/2016, 12:30 PM

## 2016-04-09 NOTE — Progress Notes (Signed)
Subjective: Interval History: has no complaint.  Objective: Vital signs in last 24 hours: Temp:  [97.9 F (36.6 C)-98.5 F (36.9 C)] 98.2 F (36.8 C) (04/05 0500) Pulse Rate:  [55-92] 92 (04/05 0500) Resp:  [14-16] 15 (04/05 0500) BP: (104-148)/(63-84) 148/84 (04/05 0500) SpO2:  [96 %-100 %] 96 % (04/05 0500) Weight:  [106.1 kg (233 lb 14.5 oz)-108.4 kg (238 lb 15.7 oz)] 108.4 kg (238 lb 15.7 oz) (04/05 0500) Weight change: -2.6 kg (-5 lb 11.7 oz)  Intake/Output from previous day: 04/04 0701 - 04/05 0700 In: 720 [P.O.:720] Out: 2400  Intake/Output this shift: No intake/output data recorded.  General appearance: alert, cooperative, no distress and moderately obese Resp: diminished breath sounds bilaterally Chest wall: IJ PC Cardio: S1, S2 normal and systolic murmur: holosystolic 2/6, blowing at apex GI: obese, pos bs, soft,.  2+remities: edema 2+ and AVF LLA  Lab Results:  Recent Labs  04/07/16 0411 04/08/16 0828  WBC 5.5 4.3  HGB 7.7* 7.8*  HCT 24.4* 23.8*  PLT 298 241   BMET:  Recent Labs  04/07/16 0411 04/08/16 0500  NA 135 134*  K 4.6 4.4  CL 108 102  CO2 20* 25  GLUCOSE 89 105*  BUN 40* 27*  CREATININE 5.58* 4.40*  CALCIUM 8.1* 7.7*    Recent Labs  04/06/16 1745  PTH 160*   Iron Studies:  Recent Labs  04/06/16 1507  IRON 63  TIBC 206*    Studies/Results: Dg Chest Port 1 View  Result Date: 04/07/2016 CLINICAL DATA:  Dialysis catheter placement EXAM: PORTABLE CHEST 1 VIEW COMPARISON:  04/01/2016 FINDINGS: 1107 hours. Stable asymmetric elevation right hemidiaphragm. Lungs are clear. The cardio pericardial silhouette is enlarged. Right IJ dialysis catheter is new in the interval with distal tip overlying the upper right atrium. No evidence for right-sided pneumothorax. Telemetry leads overlie the chest. IMPRESSION: No pneumothorax status post right IJ dialysis catheter placement. Electronically Signed   By: Misty Stanley M.D.   On: 04/07/2016  11:21    I have reviewed the patient's current medications.  Assessment/Plan: 1 ESRD  Vol xs but better.  Needs perm access prior to d/c  Plan Hd in am 2 HTN lower with lower vol 3 FSG 4 Anemia esa 5 HPTH vit d 6 DM not an issue 7 Obesity P HD, access, bp med,esa, vit D    LOS: 7 days   Glory Graefe L 04/09/2016,8:57 AM

## 2016-04-10 LAB — RENAL FUNCTION PANEL
ALBUMIN: 2 g/dL — AB (ref 3.5–5.0)
Anion gap: 9 (ref 5–15)
BUN: 30 mg/dL — AB (ref 6–20)
CALCIUM: 8.1 mg/dL — AB (ref 8.9–10.3)
CO2: 23 mmol/L (ref 22–32)
Chloride: 104 mmol/L (ref 101–111)
Creatinine, Ser: 5.96 mg/dL — ABNORMAL HIGH (ref 0.44–1.00)
GFR calc Af Amer: 9 mL/min — ABNORMAL LOW (ref >60)
GFR, EST NON AFRICAN AMERICAN: 8 mL/min — AB (ref >60)
GLUCOSE: 97 mg/dL (ref 65–99)
Phosphorus: 5.4 mg/dL — ABNORMAL HIGH (ref 2.5–4.6)
Potassium: 4.2 mmol/L (ref 3.5–5.1)
Sodium: 136 mmol/L (ref 135–145)

## 2016-04-10 LAB — CBC
HCT: 23.5 % — ABNORMAL LOW (ref 36.0–46.0)
Hemoglobin: 7.4 g/dL — ABNORMAL LOW (ref 12.0–15.0)
MCH: 24.5 pg — ABNORMAL LOW (ref 26.0–34.0)
MCHC: 31.5 g/dL (ref 30.0–36.0)
MCV: 77.8 fL — ABNORMAL LOW (ref 78.0–100.0)
Platelets: 246 K/uL (ref 150–400)
RBC: 3.02 MIL/uL — ABNORMAL LOW (ref 3.87–5.11)
RDW: 17 % — ABNORMAL HIGH (ref 11.5–15.5)
WBC: 7.8 K/uL (ref 4.0–10.5)

## 2016-04-10 MED ORDER — CALCITRIOL 0.25 MCG PO CAPS
ORAL_CAPSULE | ORAL | Status: AC
  Administered 2016-04-10: 0.25 ug via ORAL
  Filled 2016-04-10: qty 1

## 2016-04-10 MED ORDER — SENNA 8.6 MG PO TABS
2.0000 | ORAL_TABLET | Freq: Every day | ORAL | Status: DC
  Administered 2016-04-10 – 2016-04-14 (×4): 17.2 mg via ORAL
  Filled 2016-04-10 (×4): qty 2

## 2016-04-10 MED ORDER — SODIUM CHLORIDE 0.9 % IV SOLN: 100.0000 mL | INTRAVENOUS | Status: DC | PRN

## 2016-04-10 MED ORDER — LIDOCAINE-PRILOCAINE 2.5-2.5 % EX CREA: 1.0000 "application " | TOPICAL_CREAM | CUTANEOUS | Status: DC | PRN

## 2016-04-10 MED ORDER — POLYETHYLENE GLYCOL 3350 17 G PO PACK
17.0000 g | PACK | Freq: Every day | ORAL | Status: DC
  Administered 2016-04-10 – 2016-04-15 (×6): 17 g via ORAL
  Filled 2016-04-10 (×6): qty 1

## 2016-04-10 MED ORDER — HEPARIN SODIUM (PORCINE) 1000 UNIT/ML DIALYSIS
100.0000 [IU]/kg | INTRAMUSCULAR | Status: DC | PRN
  Filled 2016-04-10: qty 11

## 2016-04-10 MED ORDER — LIDOCAINE HCL (PF) 1 % IJ SOLN: 5.0000 mL | INTRAMUSCULAR | Status: DC | PRN

## 2016-04-10 MED ORDER — HEPARIN SODIUM (PORCINE) 1000 UNIT/ML DIALYSIS: 1000.0000 [IU] | INTRAMUSCULAR | Status: DC | PRN

## 2016-04-10 MED ORDER — ALTEPLASE 2 MG IJ SOLR: 2.0000 mg | Freq: Once | INTRAMUSCULAR | Status: DC | PRN

## 2016-04-10 MED ORDER — PENTAFLUOROPROP-TETRAFLUOROETH EX AERO: 1.0000 "application " | INHALATION_SPRAY | CUTANEOUS | Status: DC | PRN

## 2016-04-10 NOTE — Progress Notes (Signed)
PROGRESS NOTE    Harolyn [name]  UYQ:[id number] DOB: April 27, 1975 DOA: 04/01/2016 PCP: Hillis Range   Brief Narrative:  Patricia [name] is a 41 y.o. 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 She just recently had a nuclear stress tests 8 days ago at [facility] that was noted to be negative for any acute signs of ischemia. HA and CP thought to be due to HTN urgency/emergency. BP corrected but patient had very little urine output. It was decided to start dialysis in the hospital. Patient's access was difficult and plan is for revision on Tuesday 4/10. Patient also needs to be clipped.   Assessment & Plan:   Principal Problem:   Chest pain Active Problems:   HTN (hypertension)   DM (diabetes mellitus) (HCC)   Hypercholesterolemia   Nausea and vomiting   ESRD (end stage renal disease) (HCC)   Anemia due to chronic kidney disease   GERD (gastroesophageal reflux disease)   HTN urgency - presented with blood pressure of 180/101, patient reported she had nausea and vomiting and probably was vomiting her BP meds - Better controlled but may need further titration. Blood pressures in the 140-160/90's. On amlodipine 5 MG at bedtime.  CKD stage V/new ESRD -  Secondary to minimal change disease, not on HD PTA, but had her fistula ready for dialysis in the left upper extremity. - The left upper extremity fistula does not work, had dialysis catheter placed by vascular surgery. - Needs permanent access and CLIP process before discharge- plan for access by vascular on 04/14/16  Anemia -Anemia of CKD, hemoglobin is 7.7, per patient she is on EPO as outpatient. -Missed her dose last week, well restart EPO.  - Stable. Transfuse if hemoglobin <7. Follow CBCs periodically across HD.  DM -Appears to be very controlled with diabetic diet. -A1c: 5.9  N/V -  Resolved.  Hypomagnesemia - repleted  Secondary hyperparathyroidism Per nephrology.  Obesity/Body mass index is 36.32 kg/m.   DVT prophylaxis: SQ Heparin Code Status:Full Code Family Communication: None at bedside.  Disposition Plan: CLIP and permanent access before DC, Probably next week.   Subjective:  Seen this morning at dialysis. Denied complaints. Soreness at dialysis catheter site has improved.   ROS: No chest pain, dyspnea, lightheadedness or dizziness.   Objective: Vitals:   04/10/16 1000 04/10/16 1030 04/10/16 1046 04/10/16 1105  BP: (!) 154/94 (!) 163/99 (!) 147/91 127/77  Pulse: 96 97 91 92  Resp: $Remo'18 18 18 18  'Yckmr$ Temp:   97.9 F (36.6 C) 98.8 F (37.1 C)  TempSrc:   Oral Oral  SpO2:   98% 98%  Weight:   105.2 kg (231 lb 14.8 oz)   Height:        Intake/Output Summary (Last 24 hours) at 04/10/16 1238 Last data filed at 04/10/16 1046  Gross per 24 hour  Intake              600 ml  Output            -3000 ml  Net             3600 ml   Filed Weights   04/10/16 0540 04/10/16 0730 04/10/16 1046  Weight: 108.5 kg (239 lb 3.2 oz) 108 kg (238 lb 1.6 oz) 105.2 kg (231 lb 14.8 oz)    Examination:  General exam: Pleasant young female, moderately built  and obese, lying comfortably supine in bed.  Respiratory system: Clear to auscultation. Respiratory effort normal. R IJ HD cath site looks clean and dry.  Cardiovascular system: S1 & S2 heard, RRR. No JVD, murmurs, rubs, gallops or clicks. No pedal edema.Not on telemetry. Gastrointestinal system: Abdomen is nondistended, soft and nontender. No organomegaly or masses felt. Normal bowel sounds heard. Central nervous system: Alert and oriented. No focal neurological deficits.    Data Reviewed: I have personally reviewed following labs and imaging studies  CBC:  Recent Labs Lab 04/06/16 0310 04/07/16 0411 04/08/16 0828 04/10/16 0730  WBC 5.6 5.5 4.3 7.8  HGB 7.7* 7.7* 7.8* 7.4*  HCT 23.3* 24.4* 23.8*  23.5*  MCV 76.1* 75.5* 76.3* 77.8*  PLT 290 298 241 188   Basic Metabolic Panel:  Recent Labs Lab 04/04/16 0319 04/05/16 0410 04/06/16 0310 04/07/16 0411 04/08/16 0500 04/10/16 0730  NA 140 136 137 135 134* 136  K 4.3 4.1 4.1 4.6 4.4 4.2  CL 111 108 107 108 102 104  CO2 19* 19* 21* 20* 25 23  GLUCOSE 107* 89 101* 89 105* 97  BUN 37* 33* 40* 40* 27* 30*  CREATININE 5.03* 5.15* 5.70* 5.58* 4.40* 5.96*  CALCIUM 8.1* 8.0* 8.0* 8.1* 7.7* 8.1*  MG 1.8  --   --   --   --   --   PHOS 5.6* 5.6* 4.6 4.8* 4.8* 5.4*   GFR: Estimated Creatinine Clearance: 15.3 mL/min (A) (by C-G formula based on SCr of 5.96 mg/dL (H)). Liver Function Tests:  Recent Labs Lab 04/05/16 0410 04/06/16 0310 04/07/16 0411 04/08/16 0500 04/10/16 0730  ALBUMIN 1.9* 1.8* 1.9* 2.0* 2.0*   CBG:  Recent Labs Lab 04/07/16 1031  GLUCAP 97   Urine analysis:    Component Value Date/Time   COLORURINE YELLOW 04/03/2016 1626   APPEARANCEUR HAZY (A) 04/03/2016 1626   LABSPEC 1.012 04/03/2016 1626   PHURINE 6.0 04/03/2016 1626   GLUCOSEU 150 (A) 04/03/2016 1626   HGBUR NEGATIVE 04/03/2016 1626   BILIRUBINUR NEGATIVE 04/03/2016 1626   KETONESUR NEGATIVE 04/03/2016 1626   PROTEINUR >=300 (A) 04/03/2016 1626   UROBILINOGEN 0.2 09/19/2014 1620   NITRITE NEGATIVE 04/03/2016 1626   LEUKOCYTESUR NEGATIVE 04/03/2016 1626    ) Recent Results (from the past 240 hour(s))  Surgical pcr screen     Status: None   Collection Time: 04/07/16  7:50 AM  Result Value Ref Range Status   MRSA, PCR NEGATIVE NEGATIVE Final   Staphylococcus aureus NEGATIVE NEGATIVE Final    Comment:        The Xpert SA Assay (FDA approved for NASAL specimens in patients over 33 years of age), is one component of a comprehensive surveillance program.  Test performance has been validated by [facility] for patients greater than or equal to 22 year old. It is not intended to diagnose infection nor to guide or monitor treatment.        Anti-infectives    Start     Dose/Rate Route Frequency Ordered Stop   04/07/16 0830  ceFAZolin (ANCEF) IVPB 2g/100 mL premix     2 g 200 mL/hr over 30 Minutes Intravenous  Once 04/07/16 0827 04/07/16 0929   04/07/16 0828  ceFAZolin (ANCEF) 2-4 GM/100ML-% IVPB    Comments:  Forte, Lindsi   : cabinet override      04/07/16 4166 04/07/16 0929       Radiology Studies: No results found.      Scheduled Meds: . allopurinol  100 mg Oral Daily  . amLODipine  5 mg Oral QHS  . aspirin  81 mg Oral Daily  . atorvastatin  40 mg Oral Daily  . calcitRIOL  0.25 mcg Oral Q M,W,F  . calcium acetate  1,334 mg Oral TID WC  . darbepoetin (ARANESP) injection - DIALYSIS  200 mcg Intravenous Q Tue-HD  . heparin  5,000 Units Subcutaneous Q8H  . multivitamin with minerals  1 tablet Oral Daily  . pantoprazole  40 mg Oral Daily  . sertraline  50 mg Oral Daily   Continuous Infusions: . sodium chloride 10 mL/hr at 04/07/16 0829     LOS: 8 days    Time spent: 25 min    Sissy Goetzke, MD, FACP, FHM. [facility] Pager [phone]  If 7PM-7AM, please contact night-coverage [email or web address] Password [facility] 04/10/2016, 12:38 PM

## 2016-04-10 NOTE — Procedures (Signed)
I was present at this session.  I have reviewed the session itself and made appropriate changes.  HD via PC, bp ^to start, lowering vol.  Low flows for 3rd HD.  tol well  Otillia Cordone L 4/6/20189:04 AM

## 2016-04-10 NOTE — Progress Notes (Signed)
Subjective: Interval History: has no complaint .  Objective: Vital signs in last 24 hours: Temp:  [97 F (36.1 C)-98.6 F (37 C)] 97 F (36.1 C) (04/06 0745) Pulse Rate:  [86-95] 94 (04/06 0830) Resp:  [16-18] 17 (04/06 0800) BP: (132-153)/(45-94) 145/94 (04/06 0830) SpO2:  [96 %-100 %] 98 % (04/06 0830) Weight:  [108 kg (238 lb 1.6 oz)-108.5 kg (239 lb 3.2 oz)] 108 kg (238 lb 1.6 oz) (04/06 0730) Weight change: 0.1 kg (3.5 oz)  Intake/Output from previous day: 04/05 0701 - 04/06 0700 In: 840 [P.O.:840] Out: 0  Intake/Output this shift: No intake/output data recorded.  General appearance: alert, cooperative, no distress and moderately obese Resp: diminished breath sounds bilaterally Chest wall: IJ PC Cardio: S1, S2 normal and systolic murmur: holosystolic 2/6, blowing at apex GI: obese, pos bs, liver down 4 cm Extremities: edema 1-2+, AVF LLA  Lab Results:  Recent Labs  04/08/16 0828 04/10/16 0730  WBC 4.3 7.8  HGB 7.8* 7.4*  HCT 23.8* 23.5*  PLT 241 246   BMET:  Recent Labs  04/08/16 0500 04/10/16 0730  NA 134* 136  K 4.4 4.2  CL 102 104  CO2 25 23  GLUCOSE 105* 97  BUN 27* 30*  CREATININE 4.40* 5.96*  CALCIUM 7.7* 8.1*   No results for input(s): PTH in the last 72 hours. Iron Studies: No results for input(s): IRON, TIBC, TRANSFERRIN, FERRITIN in the last 72 hours.  Studies/Results: No results found.  I have reviewed the patient's current medications.  Assessment/Plan: 1 ESRD Hd today, lower solute/vol.  Access procedure Tues and d/c 2 Anemia esa/Fe 3 HPTH vit D 4 Obesity 5 HTN lower vol P HD, esa, lower vol, access procedure    LOS: 8 days   Frans Valente L 04/10/2016,9:05 AM

## 2016-04-10 NOTE — Plan of Care (Signed)
Problem: Education: Goal: Knowledge of [facility] Education information/materials will improve Outcome: Progressing Patient aware of plan of care.  RN reviewed medications with patient prior to administration.  Patient stated understanding.

## 2016-04-11 DIAGNOSIS — K5900 Constipation, unspecified: Secondary | ICD-10-CM

## 2016-04-11 MED ORDER — SORBITOL 70 % SOLN
30.0000 mL | Freq: Every day | Status: DC | PRN
  Administered 2016-04-11 – 2016-04-12 (×2): 30 mL via ORAL
  Filled 2016-04-11 (×2): qty 30

## 2016-04-11 MED ORDER — BISACODYL 10 MG RE SUPP: 10.0000 mg | Freq: Every day | RECTAL | Status: DC | PRN

## 2016-04-11 NOTE — Progress Notes (Signed)
Subjective: Interval History: has complaints would like to go to [facility] and interested in HT.  Objective: Vital signs in last 24 hours: Temp:  [97.9 F (36.6 C)-99.4 F (37.4 C)] 98.5 F (36.9 C) (04/07 0403) Pulse Rate:  [86-99] 91 (04/07 0403) Resp:  [18] 18 (04/07 0403) BP: (127-163)/(77-102) 140/79 (04/07 0403) SpO2:  [96 %-100 %] 96 % (04/07 0403) Weight:  [105.2 kg (231 lb 14.4 oz)-108.1 kg (238 lb 4.8 oz)] 105.2 kg (231 lb 14.4 oz) (04/07 0651) Weight change: -0.5 kg (-1 lb 1.7 oz)  Intake/Output from previous day: 04/06 0701 - 04/07 0700 In: 1380 [P.O.:1380] Out: -3000  Intake/Output this shift: Total I/O In: 60 [P.O.:60] Out: -   General appearance: alert, cooperative, no distress, moderately obese and pale Resp: clear to auscultation bilaterally Chest wall: IJ PC Cardio: S1, S2 normal and systolic murmur: holosystolic 2/6, blowing at apex GI: obese, pos bs 1-2+remities: edema 1-2+ and AVF RLA  Lab Results:  Recent Labs  04/10/16 0730  WBC 7.8  HGB 7.4*  HCT 23.5*  PLT 246   BMET:  Recent Labs  04/10/16 0730  NA 136  K 4.2  CL 104  CO2 23  GLUCOSE 97  BUN 30*  CREATININE 5.96*  CALCIUM 8.1*   No results for input(s): PTH in the last 72 hours. Iron Studies: No results for input(s): IRON, TIBC, TRANSFERRIN, FERRITIN in the last 72 hours.  Studies/Results: No results found.  I have reviewed the patient's current medications.  Assessment/Plan: 1 ESRD will do HD on Mon.  reCLIP, HT to contact 2 HTN lower with lower vol 3 anemia esa/Fe 4 HPTH vit d 5 DM 6 Obesity P Revise avf, Hd on Mon, HT to see, reCLIP    LOS: 9 days   Ellaina Schuler L 04/11/2016,9:09 AM

## 2016-04-11 NOTE — Plan of Care (Signed)
Problem: Fluid Volume: Goal: Ability to maintain a balanced intake and output will improve Outcome: Progressing No swelling in bilateral extremities noted per RN.  Lungs clear to auscultation bilaterally.

## 2016-04-11 NOTE — Progress Notes (Signed)
PROGRESS NOTE    [name]  SWN:[id number] DOB: [birth year] DOA: 04/01/2016 PCP: Hillis Range   Brief Narrative:  Patricia [name] is a 41 y.o. 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 She just recently had a nuclear stress tests 8 days ago at [facility] that was noted to be negative for any acute signs of ischemia. HA and CP thought to be due to HTN urgency/emergency. BP corrected but patient had very little urine output. It was decided to start dialysis in the hospital. Patient's access was difficult and plan is for revision on Tuesday 4/10. Patient also needs to be clipped.   Assessment & Plan:   Principal Problem:   Chest pain Active Problems:   HTN (hypertension)   DM (diabetes mellitus) (HCC)   Hypercholesterolemia   Nausea and vomiting   ESRD (end stage renal disease) (HCC)   Anemia due to chronic kidney disease   GERD (gastroesophageal reflux disease)   HTN urgency - presented with blood pressure of 180/101, patient reported she had nausea and vomiting and probably was vomiting her BP meds - Better controlled but may need further titration. On amlodipine 5 MG at bedtime.  CKD stage V/new ESRD -  Secondary to minimal change disease, not on HD PTA, but had her fistula ready for dialysis in the left upper extremity. - The left upper extremity fistula does not work, had dialysis catheter placed by vascular surgery. - Needs permanent access and CLIP process before discharge- plan for access by vascular on 04/14/16  Anemia -Anemia of CKD, hemoglobin is 7.7, per patient she is on EPO as outpatient. -Missed her dose last week, well restart EPO.  - Stable. Transfuse if hemoglobin <7. Follow CBCs periodically across HD.  DM -Appears to be very controlled with diabetic diet. -A1c: 5.9  N/V - Resolved.  Hypomagnesemia -  repleted  Secondary hyperparathyroidism Per nephrology.  Obesity/Body mass index is 36.32 kg/m.   Constipation  - initiated bowel regimen: Senna, MiraLAX and when necessary rectal Dulcolax.   DVT prophylaxis: SQ Heparin Code Status:Full Code Family Communication: None at bedside.  Disposition Plan: CLIP and permanent access before DC, Probably next week.   Subjective:  Constipation. No BM in the last 1 week. Passing flatus. Occasional abdominal cramps. No nausea or vomiting. Wishes to try senna, MiraLAX and mobilization prior to rectal Dulcolax. Discussed with RN.   ROS: No chest pain, dyspnea, lightheadedness or dizziness.   Objective: Vitals:   04/10/16 1417 04/10/16 2123 04/11/16 0403 04/11/16 0651  BP: (!) 150/85 (!) 155/82 140/79   Pulse: 92 86 91   Resp: $Remo'18 18 18   'dsnRh$ Temp: 98.9 F (37.2 C) 99.4 F (37.4 C) 98.5 F (36.9 C)   TempSrc: Oral Oral Oral   SpO2: 100% 98% 96%   Weight:   108.1 kg (238 lb 4.8 oz) 105.2 kg (231 lb 14.4 oz)  Height:        Intake/Output Summary (Last 24 hours) at 04/11/16 1048 Last data filed at 04/11/16 0856  Gross per 24 hour  Intake             1440 ml  Output                0 ml  Net             1440 ml   Filed Weights   04/10/16  1046 04/11/16 0403 04/11/16 0651  Weight: 105.2 kg (231 lb 14.8 oz) 108.1 kg (238 lb 4.8 oz) 105.2 kg (231 lb 14.4 oz)    Examination:  General exam: Pleasant young female, moderately built and obese, lying comfortably supine in bed.  Respiratory system: Clear to auscultation. Respiratory effort normal. R IJ HD cath site looks clean and dry.  Cardiovascular system: S1 & S2 heard, RRR. No JVD, murmurs, rubs, gallops or clicks. No pedal edema.Not on telemetry. Gastrointestinal system: Abdomen is nondistended, soft and nontender. No organomegaly or masses felt. Normal bowel sounds heard. Central nervous system: Alert and oriented. No focal neurological deficits.    Data Reviewed: I have personally  reviewed following labs and imaging studies  CBC:  Recent Labs Lab 04/06/16 0310 04/07/16 0411 04/08/16 0828 04/10/16 0730  WBC 5.6 5.5 4.3 7.8  HGB 7.7* 7.7* 7.8* 7.4*  HCT 23.3* 24.4* 23.8* 23.5*  MCV 76.1* 75.5* 76.3* 77.8*  PLT 290 298 241 409   Basic Metabolic Panel:  Recent Labs Lab 04/05/16 0410 04/06/16 0310 04/07/16 0411 04/08/16 0500 04/10/16 0730  NA 136 137 135 134* 136  K 4.1 4.1 4.6 4.4 4.2  CL 108 107 108 102 104  CO2 19* 21* 20* 25 23  GLUCOSE 89 101* 89 105* 97  BUN 33* 40* 40* 27* 30*  CREATININE 5.15* 5.70* 5.58* 4.40* 5.96*  CALCIUM 8.0* 8.0* 8.1* 7.7* 8.1*  PHOS 5.6* 4.6 4.8* 4.8* 5.4*   GFR: Estimated Creatinine Clearance: 15.3 mL/min (A) (by C-G formula based on SCr of 5.96 mg/dL (H)). Liver Function Tests:  Recent Labs Lab 04/05/16 0410 04/06/16 0310 04/07/16 0411 04/08/16 0500 04/10/16 0730  ALBUMIN 1.9* 1.8* 1.9* 2.0* 2.0*   CBG:  Recent Labs Lab 04/07/16 1031  GLUCAP 97   Urine analysis:    Component Value Date/Time   COLORURINE YELLOW 04/03/2016 1626   APPEARANCEUR HAZY (A) 04/03/2016 1626   LABSPEC 1.012 04/03/2016 1626   PHURINE 6.0 04/03/2016 1626   GLUCOSEU 150 (A) 04/03/2016 1626   HGBUR NEGATIVE 04/03/2016 1626   BILIRUBINUR NEGATIVE 04/03/2016 1626   KETONESUR NEGATIVE 04/03/2016 1626   PROTEINUR >=300 (A) 04/03/2016 1626   UROBILINOGEN 0.2 09/19/2014 1620   NITRITE NEGATIVE 04/03/2016 1626   LEUKOCYTESUR NEGATIVE 04/03/2016 1626    ) Recent Results (from the past 240 hour(s))  Surgical pcr screen     Status: None   Collection Time: 04/07/16  7:50 AM  Result Value Ref Range Status   MRSA, PCR NEGATIVE NEGATIVE Final   Staphylococcus aureus NEGATIVE NEGATIVE Final    Comment:        The Xpert SA Assay (FDA approved for NASAL specimens in patients over 43 years of age), is one component of a comprehensive surveillance program.  Test performance has been validated by [facility] for patients  greater than or equal to 3 year old. It is not intended to diagnose infection nor to guide or monitor treatment.       Anti-infectives    Start     Dose/Rate Route Frequency Ordered Stop   04/07/16 0830  ceFAZolin (ANCEF) IVPB 2g/100 mL premix     2 g 200 mL/hr over 30 Minutes Intravenous  Once 04/07/16 0827 04/07/16 0929   04/07/16 0828  ceFAZolin (ANCEF) 2-4 GM/100ML-% IVPB    Comments:  Forte, Lindsi   : cabinet override      04/07/16 7353 04/07/16 0929       Radiology Studies: No results found.  Scheduled Meds: . allopurinol  100 mg Oral Daily  . amLODipine  5 mg Oral QHS  . aspirin  81 mg Oral Daily  . atorvastatin  40 mg Oral Daily  . calcitRIOL  0.25 mcg Oral Q M,W,F  . calcium acetate  1,334 mg Oral TID WC  . darbepoetin (ARANESP) injection - DIALYSIS  200 mcg Intravenous Q Tue-HD  . heparin  5,000 Units Subcutaneous Q8H  . multivitamin with minerals  1 tablet Oral Daily  . pantoprazole  40 mg Oral Daily  . polyethylene glycol  17 g Oral Daily  . senna  2 tablet Oral QHS  . sertraline  50 mg Oral Daily   Continuous Infusions: . sodium chloride 10 mL/hr at 04/07/16 0829     LOS: 9 days    Time spent: 25 min    Laker Thompson, MD, FACP, FHM. [facility] Pager [phone]  If 7PM-7AM, please contact night-coverage [email or web address] Password [facility] 04/11/2016, 10:48 AM

## 2016-04-12 MED ORDER — AMLODIPINE BESYLATE 10 MG PO TABS
10.0000 mg | ORAL_TABLET | Freq: Every day | ORAL | Status: DC
  Administered 2016-04-12 – 2016-04-14 (×3): 10 mg via ORAL
  Filled 2016-04-12 (×3): qty 1

## 2016-04-12 MED ORDER — MILK AND MOLASSES ENEMA
1.0000 | Freq: Once | RECTAL | Status: DC
Start: 2016-04-12 — End: 2016-04-15
  Filled 2016-04-12: qty 250

## 2016-04-12 NOTE — Progress Notes (Signed)
Subjective: Interval History: consitp, walking , .  Objective: Vital signs in last 24 hours: Temp:  [98.7 F (37.1 C)-99.4 F (37.4 C)] 98.7 F (37.1 C) (04/08 0513) Pulse Rate:  [89-106] 97 (04/08 0513) Resp:  [18-20] 20 (04/08 0513) BP: (151-168)/(83-102) 168/83 (04/08 0513) SpO2:  [98 %-100 %] 98 % (04/08 0513) Weight:  [107.3 kg (236 lb 9.6 oz)] 107.3 kg (236 lb 9.6 oz) (04/08 0517) Weight change: -0.679 kg (-1 lb 8 oz)  Intake/Output from previous day: 04/07 0701 - 04/08 0700 In: 962 [P.O.:962] Out: -  Intake/Output this shift: No intake/output data recorded.  General appearance: alert, cooperative, no distress and moderately obese Resp: clear to auscultation bilaterally Chest wall: IJ PC Cardio: S1, S2 normal and systolic murmur: holosystolic 2/6, blowing at apex GI: obese, pos bs, liver down 5 cm Extremities: edema 1+ and AVF LLA  Lab Results:  Recent Labs  04/10/16 0730  WBC 7.8  HGB 7.4*  HCT 23.5*  PLT 246   BMET:  Recent Labs  04/10/16 0730  NA 136  K 4.2  CL 104  CO2 23  GLUCOSE 97  BUN 30*  CREATININE 5.96*  CALCIUM 8.1*   No results for input(s): PTH in the last 72 hours. Iron Studies: No results for input(s): IRON, TIBC, TRANSFERRIN, FERRITIN in the last 72 hours.  Studies/Results: No results found.  I have reviewed the patient's current medications.  Assessment/Plan: 1 ESRD wants to go to [facility].  eval for HT,   HD in am. Access on Tues 2 HTN lower vol 3 anemia esa/Fe 4 HPTH vit D 5 obesity 6 DM P HD, access, esa, control bp    LOS: 10 days   Patricia [name] L 04/12/2016,8:48 AM

## 2016-04-12 NOTE — Plan of Care (Signed)
Problem: Activity: Goal: Ability to tolerate increased activity will improve Outcome: Progressing Per patient ambulated twice throughout yesterday completing a total of five laps around the nurses station.  Per patient tolerated well.

## 2016-04-12 NOTE — Progress Notes (Signed)
PROGRESS NOTE    Patricia [name]  JOA:[id number] DOB: 07/13/1975 DOA: 04/01/2016 PCP: Hillis Range   Brief Narrative:  Mc Hollen is a 41 y.o. 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 She just recently had a nuclear stress tests 8 days ago at [facility] that was noted to be negative for any acute signs of ischemia. HA and CP thought to be due to HTN urgency/emergency. BP corrected but patient had very little urine output. It was decided to start dialysis in the hospital. Patient's access was difficult and plan is for revision on Tuesday 4/10. Patient also needs to be clipped.   Assessment & Plan:   Principal Problem:   Chest pain Active Problems:   HTN (hypertension)   DM (diabetes mellitus) (HCC)   Hypercholesterolemia   Nausea and vomiting   ESRD (end stage renal disease) (HCC)   Anemia due to chronic kidney disease   GERD (gastroesophageal reflux disease)   HTN urgency - presented with blood pressure of 180/101, patient reported she had nausea and vomiting and probably was vomiting her BP meds - Better controlled but may need further titration> we'll defer to nephrology. On amlodipine 5 MG at bedtime.  CKD stage V/new ESRD -  Secondary to minimal change disease, not on HD PTA, but had her fistula ready for dialysis in the left upper extremity. - The left upper extremity fistula does not work, had dialysis catheter placed by vascular surgery. - Needs permanent access and CLIP process before discharge- plan for access by vascular on 04/14/16  Anemia -Anemia of CKD, hemoglobin is 7.7, per patient she is on EPO as outpatient. -Missed her dose last week, well restart EPO.  - Stable. Transfuse if hemoglobin <7. Follow CBCs periodically across HD.  DM -Appears to be very controlled with diabetic diet. -A1c: 5.9  N/V -  Resolved.  Hypomagnesemia - repleted  Secondary hyperparathyroidism Per nephrology.  Obesity/Body mass index is 37.06 kg/m.   Constipation  - initiated bowel regimen: Senna, MiraLAX and when necessary rectal Dulcolax. - Patient declined suppository. Had a small hard BM last night. Willing to try enema today-ordered.   DVT prophylaxis: SQ Heparin Code Status:Full Code Family Communication: None at bedside.  Disposition Plan: CLIP and permanent access before DC, Probably next week.   Subjective:  Constipation. Had small hard BM last night with straining. Willing to try enema. No other complaints reported.  ROS: No chest pain, dyspnea, lightheadedness or dizziness.   Objective: Vitals:   04/11/16 1550 04/11/16 2057 04/12/16 0513 04/12/16 0517  BP: (!) 151/98 (!) 161/86 (!) 168/83   Pulse: (!) 104 89 97   Resp: $Remo'18 18 20   'QoWmG$ Temp:  99.4 F (37.4 C) 98.7 F (37.1 C)   TempSrc:  Oral Oral   SpO2:  99% 98%   Weight:    107.3 kg (236 lb 9.6 oz)  Height:        Intake/Output Summary (Last 24 hours) at 04/12/16 1040 Last data filed at 04/12/16 0520  Gross per 24 hour  Intake              902 ml  Output                0 ml  Net              902 ml   [employer]  04/11/16 0403 04/11/16 0651 04/12/16 0517  Weight: 108.1 kg (238 lb 4.8 oz) 105.2 kg (231 lb 14.4 oz) 107.3 kg (236 lb 9.6 oz)    Examination:  General exam: Pleasant young female, moderately built and obese, lying comfortably supine in bed.  Respiratory system: Clear to auscultation. Respiratory effort normal. R IJ HD cath site looks clean and dry.  Cardiovascular system: S1 & S2 heard, RRR. No JVD, murmurs, rubs, gallops or clicks. No pedal edema.Not on telemetry. Gastrointestinal system: Abdomen is nondistended, soft and nontender. No organomegaly or masses felt. Normal bowel sounds heard. Central nervous system: Alert and oriented. No focal neurological deficits.    Data Reviewed: I have personally  reviewed following labs and imaging studies  CBC:  Recent Labs Lab 04/06/16 0310 04/07/16 0411 04/08/16 0828 04/10/16 0730  WBC 5.6 5.5 4.3 7.8  HGB 7.7* 7.7* 7.8* 7.4*  HCT 23.3* 24.4* 23.8* 23.5*  MCV 76.1* 75.5* 76.3* 77.8*  PLT 290 298 241 841   Basic Metabolic Panel:  Recent Labs Lab 04/06/16 0310 04/07/16 0411 04/08/16 0500 04/10/16 0730  NA 137 135 134* 136  K 4.1 4.6 4.4 4.2  CL 107 108 102 104  CO2 21* 20* 25 23  GLUCOSE 101* 89 105* 97  BUN 40* 40* 27* 30*  CREATININE 5.70* 5.58* 4.40* 5.96*  CALCIUM 8.0* 8.1* 7.7* 8.1*  PHOS 4.6 4.8* 4.8* 5.4*   GFR: Estimated Creatinine Clearance: 15.5 mL/min (A) (by C-G formula based on SCr of 5.96 mg/dL (H)). Liver Function Tests:  Recent Labs Lab 04/06/16 0310 04/07/16 0411 04/08/16 0500 04/10/16 0730  ALBUMIN 1.8* 1.9* 2.0* 2.0*   CBG:  Recent Labs Lab 04/07/16 1031  GLUCAP 97   Urine analysis:    Component Value Date/Time   COLORURINE YELLOW 04/03/2016 1626   APPEARANCEUR HAZY (A) 04/03/2016 1626   LABSPEC 1.012 04/03/2016 1626   PHURINE 6.0 04/03/2016 1626   GLUCOSEU 150 (A) 04/03/2016 1626   HGBUR NEGATIVE 04/03/2016 1626   BILIRUBINUR NEGATIVE 04/03/2016 1626   KETONESUR NEGATIVE 04/03/2016 1626   PROTEINUR >=300 (A) 04/03/2016 1626   UROBILINOGEN 0.2 09/19/2014 1620   NITRITE NEGATIVE 04/03/2016 1626   LEUKOCYTESUR NEGATIVE 04/03/2016 1626    ) Recent Results (from the past 240 hour(s))  Surgical pcr screen     Status: None   Collection Time: 04/07/16  7:50 AM  Result Value Ref Range Status   MRSA, PCR NEGATIVE NEGATIVE Final   Staphylococcus aureus NEGATIVE NEGATIVE Final    Comment:        The Xpert SA Assay (FDA approved for NASAL specimens in patients over 31 years of age), is one component of a comprehensive surveillance program.  Test performance has been validated by [facility] for patients greater than or equal to 28 year old. It is not intended to diagnose infection  nor to guide or monitor treatment.       Anti-infectives    Start     Dose/Rate Route Frequency Ordered Stop   04/07/16 0830  ceFAZolin (ANCEF) IVPB 2g/100 mL premix     2 g 200 mL/hr over 30 Minutes Intravenous  Once 04/07/16 0827 04/07/16 0929   04/07/16 0828  ceFAZolin (ANCEF) 2-4 GM/100ML-% IVPB    Comments:  Forte, Lindsi   : cabinet override      04/07/16 3244 04/07/16 0929       Radiology Studies: No results found.      Scheduled Meds: . allopurinol  100 mg Oral Daily  .  amLODipine  10 mg Oral QHS  . aspirin  81 mg Oral Daily  . atorvastatin  40 mg Oral Daily  . calcitRIOL  0.25 mcg Oral Q M,W,F  . calcium acetate  1,334 mg Oral TID WC  . darbepoetin (ARANESP) injection - DIALYSIS  200 mcg Intravenous Q Tue-HD  . heparin  5,000 Units Subcutaneous Q8H  . milk and molasses  1 enema Rectal Once  . multivitamin with minerals  1 tablet Oral Daily  . pantoprazole  40 mg Oral Daily  . polyethylene glycol  17 g Oral Daily  . senna  2 tablet Oral QHS  . sertraline  50 mg Oral Daily   Continuous Infusions: . sodium chloride 10 mL/hr at 04/07/16 0829     LOS: 10 days    Time spent: 25 min    Kikue Gerhart, MD, FACP, FHM. [facility] Pager [phone]  If 7PM-7AM, please contact night-coverage [email or web address] Password [email or web address] 04/12/2016, 10:40 AM

## 2016-04-13 LAB — RENAL FUNCTION PANEL
ANION GAP: 9 (ref 5–15)
Albumin: 2 g/dL — ABNORMAL LOW (ref 3.5–5.0)
BUN: 32 mg/dL — AB (ref 6–20)
CALCIUM: 8.4 mg/dL — AB (ref 8.9–10.3)
CO2: 22 mmol/L (ref 22–32)
CREATININE: 5.35 mg/dL — AB (ref 0.44–1.00)
Chloride: 105 mmol/L (ref 101–111)
GFR calc Af Amer: 10 mL/min — ABNORMAL LOW (ref >60)
GFR calc non Af Amer: 9 mL/min — ABNORMAL LOW (ref >60)
GLUCOSE: 102 mg/dL — AB (ref 65–99)
PHOSPHORUS: 5 mg/dL — AB (ref 2.5–4.6)
Potassium: 4.1 mmol/L (ref 3.5–5.1)
SODIUM: 136 mmol/L (ref 135–145)

## 2016-04-13 LAB — CBC
HEMATOCRIT: 23.9 % — AB (ref 36.0–46.0)
Hemoglobin: 7.6 g/dL — ABNORMAL LOW (ref 12.0–15.0)
MCH: 25 pg — ABNORMAL LOW (ref 26.0–34.0)
MCHC: 31.8 g/dL (ref 30.0–36.0)
MCV: 78.6 fL (ref 78.0–100.0)
PLATELETS: 307 10*3/uL (ref 150–400)
RBC: 3.04 MIL/uL — ABNORMAL LOW (ref 3.87–5.11)
RDW: 18.1 % — AB (ref 11.5–15.5)
WBC: 6.8 10*3/uL (ref 4.0–10.5)

## 2016-04-13 LAB — TROPONIN I

## 2016-04-13 LAB — MAGNESIUM: Magnesium: 2.1 mg/dL (ref 1.7–2.4)

## 2016-04-13 MED ORDER — PENTAFLUOROPROP-TETRAFLUOROETH EX AERO: 1.0000 "application " | INHALATION_SPRAY | CUTANEOUS | Status: DC | PRN

## 2016-04-13 MED ORDER — LIDOCAINE-PRILOCAINE 2.5-2.5 % EX CREA: 1.0000 "application " | TOPICAL_CREAM | CUTANEOUS | Status: DC | PRN

## 2016-04-13 MED ORDER — SODIUM CHLORIDE 0.9 % IV SOLN: 100.0000 mL | INTRAVENOUS | Status: DC | PRN

## 2016-04-13 MED ORDER — LIDOCAINE HCL (PF) 1 % IJ SOLN: 5.0000 mL | INTRAMUSCULAR | Status: DC | PRN

## 2016-04-13 MED ORDER — DEXTROSE 5 % IV SOLN
1.5000 g | INTRAVENOUS | Status: AC
  Administered 2016-04-14: 1.5 g via INTRAVENOUS
  Filled 2016-04-13 (×2): qty 1.5

## 2016-04-13 MED ORDER — NITROGLYCERIN 0.4 MG SL SUBL
SUBLINGUAL_TABLET | SUBLINGUAL | Status: AC
Start: 2016-04-13 — End: 2016-04-13
  Administered 2016-04-13: 0.4 mg
  Filled 2016-04-13: qty 1

## 2016-04-13 MED ORDER — NITROGLYCERIN 0.4 MG SL SUBL: 0.4000 mg | SUBLINGUAL_TABLET | SUBLINGUAL | Status: DC | PRN

## 2016-04-13 MED ORDER — ONDANSETRON HCL 4 MG/2ML IJ SOLN
INTRAMUSCULAR | Status: AC
  Administered 2016-04-13: 4 mg via INTRAVENOUS
  Filled 2016-04-13: qty 2

## 2016-04-13 MED ORDER — HEPARIN SODIUM (PORCINE) 1000 UNIT/ML DIALYSIS: 100.0000 [IU]/kg | INTRAMUSCULAR | Status: DC | PRN

## 2016-04-13 MED ORDER — ACETAMINOPHEN 325 MG PO TABS
ORAL_TABLET | ORAL | Status: AC
  Administered 2016-04-13: 650 mg via ORAL
  Filled 2016-04-13: qty 2

## 2016-04-13 MED ORDER — ALTEPLASE 2 MG IJ SOLR: 2.0000 mg | Freq: Once | INTRAMUSCULAR | Status: DC | PRN

## 2016-04-13 MED ORDER — LISINOPRIL 10 MG PO TABS
10.0000 mg | ORAL_TABLET | Freq: Every day | ORAL | Status: DC
  Administered 2016-04-13 – 2016-04-15 (×2): 10 mg via ORAL
  Filled 2016-04-13: qty 1

## 2016-04-13 MED ORDER — OXYCODONE HCL 5 MG PO TABS
ORAL_TABLET | ORAL | Status: AC
  Administered 2016-04-13: 5 mg via ORAL
  Filled 2016-04-13: qty 1

## 2016-04-13 MED ORDER — HEPARIN SODIUM (PORCINE) 1000 UNIT/ML DIALYSIS: 1000.0000 [IU] | INTRAMUSCULAR | Status: DC | PRN

## 2016-04-13 NOTE — Progress Notes (Addendum)
Pt states that she feels like shes going to pass out. VS taken and stable. Neuro assessment in tact. Paged provider Hongalgi, Awaiting further orders. Will continue to monitor pt. Per provider continue to monitor pt. No other orders given at this time.

## 2016-04-13 NOTE — Progress Notes (Signed)
Patient arrived to unit by bed.  Reviewed treatment plan and this RN agrees with plan.  Report received from bedside RN, Margreta Journey.  Consent obtained.  Patient A & O X 4.   Lung sounds diminished to ausculation in all fields. Generalized non pitting edema. Cardiac:  NSR.  Removed caps and cleansed RIJ catheter with chlorhedxidine.  Aspirated ports of heparin and flushed them with saline per protocol.  Connected and secured lines, initiated treatment at 0756.  UF Goal of 4000 mL and net fluid removal 3.5L.  Will continue to monitor.

## 2016-04-13 NOTE — Progress Notes (Signed)
Pt now with complaint of chest pain that she's unable to rate on scale that she describes as pressure. VS taken again with bp of 142/92, hr-93, o2%-96%. Provider Hongalgi to bedside. Pt rating pain 6/10 to left chest. Pt to get po nitro, an EKG, and cycle triponins.

## 2016-04-13 NOTE — Progress Notes (Signed)
Dialysis treatment completed.  3500 mL ultrafiltrated.  3000 mL net fluid removal.  Patient status unchanged. Lung sounds diminished to ausculation in all fields. Generalized edema. Cardiac: NSR.  Cleansed RIJ catheter with chlorhexidine.  Disconnected lines and flushed ports with saline per protocol.  Ports locked with heparin and capped per protocol.    Report given to bedside, RN Forest Gleason.

## 2016-04-13 NOTE — Progress Notes (Signed)
Addendum  Paged by RN after patient returned from dialysis complaining of feeling unwell like she is going to pass out and new onset of chest pain.  On arrival to the bedside. Reports precordial 6/10 chest pain, unable to describe quality, nonradiating, not associated with dyspnea.  On exam, did not appear in distress. RS clear. No reproducible tenderness. CVS: S1 and S2 heard, RRR. No JVD or pedal edema.  Patient was examined and interviewed with her female RN in room.  Assessment and plan  Atypical chest pain: EKG shows sinus tachycardia at 10 3 bpm, normal axis, Q waves in leads 3 and aVF, T-wave inversion in lead V1-2 and QTC. Reviewed EKG with a cardiologist in comparison to EKG from 04/01/16 and no significant change. QTC 534 ms. Cycle troponin (first troponin negative). Sublingual NTG when necessary-chest pain starting to decrease. Monitor on telemetry.  Prolonged QTC: Potassium 4.1. Check magnesium. Monitor on telemetry. Follow EKG in a.m. Avoid QT prolonging medications.  Discussed with RN.  Vernell Leep, MD, FACP, FHM. [facility] Pager [phone]  If 7PM-7AM, please contact night-coverage [email or web address] Password [facility] 04/13/2016, 6:54 PM

## 2016-04-13 NOTE — Progress Notes (Signed)
PROGRESS NOTE    Hila Bolding  WPV:[id number] DOB: 08-22-1975 DOA: 04/01/2016 PCP: Hillis Range   Brief Narrative:  Patricia [name] is a 41 y.o. 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 She just recently had a nuclear stress tests 8 days ago at [facility] that was noted to be negative for any acute signs of ischemia. HA and CP thought to be due to HTN urgency/emergency. BP corrected but patient had very little urine output. It was decided to start dialysis in the hospital. Patient's access was difficult and plan is for revision on Tuesday 4/10. Patient also needs to be clipped.   Assessment & Plan:   Principal Problem:   Chest pain Active Problems:   HTN (hypertension)   DM (diabetes mellitus) (HCC)   Hypercholesterolemia   Nausea and vomiting   ESRD (end stage renal disease) (HCC)   Anemia due to chronic kidney disease   GERD (gastroesophageal reflux disease)   HTN urgency - presented with blood pressure of 180/101, patient reported she had nausea and vomiting and probably was vomiting her BP meds - Better controlled but may need further titration> we'll defer to nephrology. On amlodipine 5 MG at bedtime.  CKD stage V/new ESRD -  Secondary to minimal change disease, not on HD PTA, but had her fistula ready for dialysis in the left upper extremity. - Nonfunctioning left arm aVF, had dialysis catheter placed by vascular surgery 4/3. - Needs permanent access and CLIP process before discharge- plan for access by vascular on 04/14/16  Anemia -Anemia of CKD, hemoglobin is 7.7, per patient she is on EPO as outpatient. - Stable. Transfuse if hemoglobin <7. Follow CBCs periodically across HD.  DM -Appears to be very controlled with diabetic diet. -A1c: 5.9  N/V - Resolved.  Hypomagnesemia - repleted  Secondary hyperparathyroidism Per  nephrology.  Obesity/Body mass index is 37.02 kg/m.   Constipation  - initiated bowel regimen: Senna, MiraLAX and when necessary rectal Dulcolax. - Patient had a large BM 4/8 without enema. Continue bowel regimen.  Secondary hyperparathyroidism - Per nephrology.  Hypoalbuminemia - Will consult dietitian.  Consultations: Nephrology Vascular surgery  Procedures Right IJ tunneled dialysis catheter placement by Dr. Donzetta Matters, Vascular surgery on 04/07/16 HD   DVT prophylaxis: SQ Heparin Code Status:Full Code Family Communication: None at bedside.  Disposition Plan: CLIP and permanent access before DC. Possible DC 04/15/16.   Subjective:  Seen at dialysis this morning. Had a large BM without enema on 4/8. Feels much better. Mild intermittent headache. No abdominal pain.  ROS: No chest pain, dyspnea, lightheadedness or dizziness.   Objective: Vitals:   04/13/16 0930 04/13/16 1000 04/13/16 1030 04/13/16 1100  BP: (!) 151/87 (!) 168/96 (!) 143/98 (!) 165/92  Pulse: 100 98 (!) 104 96  Resp:      Temp:      TempSrc:      SpO2:      Weight:      Height:      Respiratory rate 18 per minute, temperature 98.30F and oxygen saturation 99%.  Intake/Output Summary (Last 24 hours) at 04/13/16 1120 Last data filed at 04/13/16 0414  Gross per 24 hour  Intake              360 ml  Output                0 ml  Net              360 ml   Filed Weights   04/12/16 0517 04/13/16 0412 04/13/16 0748  Weight: 107.3 kg (236 lb 9.6 oz) 108 kg (238 lb 1.6 oz) 107.2 kg (236 lb 5.3 oz)    Examination:  General exam: Pleasant young female, moderately built and obese, lying comfortably supine in bed undergoing HD this morning.  Respiratory system: Clear to auscultation. Respiratory effort normal. R IJ HD cath site looks clean and dry.  Cardiovascular system: S1 & S2 heard, RRR. No JVD, murmurs, rubs, gallops or clicks. No pedal edema.Not on telemetry. Gastrointestinal system: Abdomen is  nondistended, soft and nontender. No organomegaly or masses felt. Normal bowel sounds heard. Central nervous system: Alert and oriented. No focal neurological deficits.    Data Reviewed: I have personally reviewed following labs and imaging studies  CBC:  Recent Labs Lab 04/07/16 0411 04/08/16 0828 04/10/16 0730 04/13/16 0815  WBC 5.5 4.3 7.8 6.8  HGB 7.7* 7.8* 7.4* 7.6*  HCT 24.4* 23.8* 23.5* 23.9*  MCV 75.5* 76.3* 77.8* 78.6  PLT 298 241 246 469   Basic Metabolic Panel:  Recent Labs Lab 04/07/16 0411 04/08/16 0500 04/10/16 0730 04/13/16 0815  NA 135 134* 136 136  K 4.6 4.4 4.2 4.1  CL 108 102 104 105  CO2 20* $Remov'25 23 22  'bvvbSO$ GLUCOSE 89 105* 97 102*  BUN 40* 27* 30* 32*  CREATININE 5.58* 4.40* 5.96* 5.35*  CALCIUM 8.1* 7.7* 8.1* 8.4*  PHOS 4.8* 4.8* 5.4* 5.0*   GFR: Estimated Creatinine Clearance: 17.3 mL/min (A) (by C-G formula based on SCr of 5.35 mg/dL (H)). Liver Function Tests:  Recent Labs Lab 04/07/16 0411 04/08/16 0500 04/10/16 0730 04/13/16 0815  ALBUMIN 1.9* 2.0* 2.0* 2.0*   CBG:  Recent Labs Lab 04/07/16 1031  GLUCAP 97   Urine analysis:    Component Value Date/Time   COLORURINE YELLOW 04/03/2016 1626   APPEARANCEUR HAZY (A) 04/03/2016 1626   LABSPEC 1.012 04/03/2016 1626   PHURINE 6.0 04/03/2016 1626   GLUCOSEU 150 (A) 04/03/2016 1626   HGBUR NEGATIVE 04/03/2016 1626   BILIRUBINUR NEGATIVE 04/03/2016 1626   KETONESUR NEGATIVE 04/03/2016 1626   PROTEINUR >=300 (A) 04/03/2016 1626   UROBILINOGEN 0.2 09/19/2014 1620   NITRITE NEGATIVE 04/03/2016 1626   LEUKOCYTESUR NEGATIVE 04/03/2016 1626    ) Recent Results (from the past 240 hour(s))  Surgical pcr screen     Status: None   Collection Time: 04/07/16  7:50 AM  Result Value Ref Range Status   MRSA, PCR NEGATIVE NEGATIVE Final   Staphylococcus aureus NEGATIVE NEGATIVE Final    Comment:        The Xpert SA Assay (FDA approved for NASAL specimens in patients over 21 years of  age), is one component of a comprehensive surveillance program.  Test performance has been validated by [facility] for patients greater than or equal to 36 year old. It is not intended to diagnose infection nor to guide or monitor treatment.       Anti-infectives    Start     Dose/Rate Route Frequency Ordered Stop   04/14/16 0830  cefUROXime (ZINACEF) 1.5 g in dextrose 5 % 50 mL IVPB     1.5 g 100 mL/hr over 30 Minutes Intravenous To ShortStay Surgical 04/13/16 0734 04/15/16 0830   04/07/16 0830  ceFAZolin (ANCEF) IVPB 2g/100 mL premix     2 g 200 mL/hr over 30 Minutes Intravenous  Once  04/07/16 0827 04/07/16 0929   04/07/16 0828  ceFAZolin (ANCEF) 2-4 GM/100ML-% IVPB    Comments:  Forte, Lindsi   : cabinet override      04/07/16 0828 04/07/16 0929       Radiology Studies: No results found.      Scheduled Meds: . allopurinol  100 mg Oral Daily  . amLODipine  10 mg Oral QHS  . aspirin  81 mg Oral Daily  . atorvastatin  40 mg Oral Daily  . calcitRIOL  0.25 mcg Oral Q M,W,F  . calcium acetate  1,334 mg Oral TID WC  . [START ON 04/14/2016] cefUROXime (ZINACEF)  IV  1.5 g Intravenous To SS-Surg  . darbepoetin (ARANESP) injection - DIALYSIS  200 mcg Intravenous Q Tue-HD  . heparin  5,000 Units Subcutaneous Q8H  . milk and molasses  1 enema Rectal Once  . multivitamin with minerals  1 tablet Oral Daily  . pantoprazole  40 mg Oral Daily  . polyethylene glycol  17 g Oral Daily  . senna  2 tablet Oral QHS  . sertraline  50 mg Oral Daily   Continuous Infusions: . sodium chloride 10 mL/hr at 04/07/16 0829     LOS: 11 days    Time spent: 25 min    Arriyah Madej, MD, FACP, FHM. [facility] Pager [phone]  If 7PM-7AM, please contact night-coverage [email or web address] Password [email or web address] 04/13/2016, 11:20 AM

## 2016-04-13 NOTE — Procedures (Signed)
Tol HD BP elevated and no major improvement with HD.  Still has some edema.  Will continue to reduce volume to optimize BP.  Add back Lisinopril.  For AVF in AM. Patricia [name] C

## 2016-04-13 NOTE — Progress Notes (Signed)
Initial Nutrition Assessment  DOCUMENTATION CODES:   Obesity unspecified  INTERVENTION:    Nepro Shake po BID, each supplement provides 425 kcal and 19 grams protein  NUTRITION DIAGNOSIS:   Inadequate oral intake related to poor appetite as evidenced by per patient/family report.  GOAL:   Patient will meet greater than or equal to 90% of their needs  MONITOR:   PO intake, Supplement acceptance, Labs, I & O's  REASON FOR ASSESSMENT:   Consult Assessment of nutrition requirement/status  ASSESSMENT:   41 y.o.femalewith PMH of HTN, HLD, anemia, CKD Stage IV, and DM type 2, who presented on 3/28 with complaints of headache and chest pain thought to be due to HTN urgency/emergency. BP corrected but patient had very little urine output. It was decided to start dialysis in the hospital.   Patient reports poor intake since starting dialysis. She has been consuming 25-100% of meals. She requested information on a renal diet. RD provided Renal Pyramid handout and reviewed recommendations for low phosphorus, low sodium, and low potassium diet.  Received consult due to low albumin. Low albumin or prealbumin is no longer used to diagnose malnutrition and are a poor indicator of nutrition status. These labs are reflective of inflammatory process, acute stress response, and levels are highly affected (decreased) by volume overload. [facility] uses the new malnutrition guidelines published by the American Society for Parenteral and Enteral Nutrition (A.S.P.E.N.) and the Academy of Nutrition and Dietetics (AND).   Nutrition-Focused physical exam completed. Findings are no fat depletion, no muscle depletion, and mild edema.  Patient with 5% weight loss within the past 3 months, not significant for time frame.  Labs reviewed: phosphorus 5 (H) Medications reviewed and include MVI, Phoslo.  Diet Order:  Diet renal with fluid restriction Fluid restriction: 1200 mL Fluid; Room service  appropriate? Yes; Fluid consistency: Thin Diet NPO time specified Except for: Sips with Meds  Skin:  Reviewed, no issues  Last BM:  4/8  Height:   Ht Readings from Last 1 Encounters:  04/01/16 $RemoveB'5\' 7"'uQKXVUis$  (1.702 m)    Weight:   Wt Readings from Last 1 Encounters:  04/13/16 229 lb 11.5 oz (104.2 kg)    Ideal Body Weight:  61.4 kg  BMI:  Body mass index is 35.98 kg/m.  Estimated Nutritional Needs:   Kcal:  2200-2400  Protein:  90-100 gm  Fluid:  1.2 L  EDUCATION NEEDS:   Education needs addressed  Molli Barrows, [place], [place], [place] Pager [phone] After Hours Pager [phone]

## 2016-04-13 NOTE — Progress Notes (Addendum)
[facility]  Subjective  - In HD   Objective (!) 166/88 92 98.2 F (36.8 C) 17 99%  Intake/Output Summary (Last 24 hours) at 04/13/16 0728 Last data filed at 04/13/16 0414  Gross per 24 hour  Intake              360 ml  Output                0 ml  Net              360 ml    Assessment/Planning: Plan left upper arm av fistula by Dr. Scot [name] tomorrow.  Patient in agreement. NPO past MN    Patricia [name] [facility] 04/13/2016 7:28 AM --  Laboratory Lab Results:  Recent Labs  04/10/16 0730  WBC 7.8  HGB 7.4*  HCT 23.5*  PLT 246   BMET  Recent Labs  04/10/16 0730  NA 136  K 4.2  CL 104  CO2 23  GLUCOSE 97  BUN 30*  CREATININE 5.96*  CALCIUM 8.1*    COAG Lab Results  Component Value Date   INR 0.98 06/18/2015   INR 1.03 01/18/2015   INR 0.94 09/27/2013   No results found for: PTT

## 2016-04-14 ENCOUNTER — Encounter (HOSPITAL_COMMUNITY): Payer: Self-pay | Admitting: Certified Registered Nurse Anesthetist

## 2016-04-14 ENCOUNTER — Encounter (HOSPITAL_COMMUNITY)
Admission: EM | Disposition: A | Discharge status: Home Health | Payer: Self-pay | Source: Home / Self Care | Attending: Internal Medicine

## 2016-04-14 ENCOUNTER — Inpatient Hospital Stay (HOSPITAL_COMMUNITY): Payer: Medicare Other | Admitting: Certified Registered Nurse Anesthetist

## 2016-04-14 ENCOUNTER — Other Ambulatory Visit: Payer: Self-pay

## 2016-04-14 ENCOUNTER — Ambulatory Visit (HOSPITAL_COMMUNITY): Admission: RE | Admit: 2016-04-14 | Payer: Medicare Other | Source: Ambulatory Visit | Admitting: Vascular Surgery

## 2016-04-14 DIAGNOSIS — Z48812 Encounter for surgical aftercare following surgery on the circulatory system: Secondary | ICD-10-CM

## 2016-04-14 DIAGNOSIS — N186 End stage renal disease: Secondary | ICD-10-CM

## 2016-04-14 DIAGNOSIS — N184 Chronic kidney disease, stage 4 (severe): Secondary | ICD-10-CM

## 2016-04-14 HISTORY — PX: LIGATION OF ARTERIOVENOUS  FISTULA: SHX5948

## 2016-04-14 HISTORY — PX: AV FISTULA PLACEMENT: SHX1204

## 2016-04-14 LAB — BASIC METABOLIC PANEL
ANION GAP: 10 (ref 5–15)
BUN: 12 mg/dL (ref 6–20)
CO2: 29 mmol/L (ref 22–32)
Calcium: 8.1 mg/dL — ABNORMAL LOW (ref 8.9–10.3)
Chloride: 95 mmol/L — ABNORMAL LOW (ref 101–111)
Creatinine, Ser: 3.63 mg/dL — ABNORMAL HIGH (ref 0.44–1.00)
GFR calc Af Amer: 17 mL/min — ABNORMAL LOW (ref >60)
GFR, EST NON AFRICAN AMERICAN: 14 mL/min — AB (ref >60)
GLUCOSE: 105 mg/dL — AB (ref 65–99)
Potassium: 3.5 mmol/L (ref 3.5–5.1)
Sodium: 134 mmol/L — ABNORMAL LOW (ref 135–145)

## 2016-04-14 LAB — CBC
HCT: 26.9 % — ABNORMAL LOW (ref 36.0–46.0)
HEMOGLOBIN: 8.2 g/dL — AB (ref 12.0–15.0)
MCH: 24 pg — AB (ref 26.0–34.0)
MCHC: 30.5 g/dL (ref 30.0–36.0)
MCV: 78.7 fL (ref 78.0–100.0)
Platelets: 325 10*3/uL (ref 150–400)
RBC: 3.42 MIL/uL — ABNORMAL LOW (ref 3.87–5.11)
RDW: 17.3 % — ABNORMAL HIGH (ref 11.5–15.5)
WBC: 7.2 10*3/uL (ref 4.0–10.5)

## 2016-04-14 LAB — PROTIME-INR
INR: 1.04
PROTHROMBIN TIME: 13.6 s (ref 11.4–15.2)

## 2016-04-14 LAB — TROPONIN I

## 2016-04-14 LAB — GLUCOSE, CAPILLARY: GLUCOSE-CAPILLARY: 103 mg/dL — AB (ref 65–99)

## 2016-04-14 SURGERY — ARTERIOVENOUS (AV) FISTULA CREATION
Anesthesia: Monitor Anesthesia Care | Site: Arm Upper | Laterality: Left

## 2016-04-14 MED ORDER — LIDOCAINE HCL (CARDIAC) 20 MG/ML IV SOLN
INTRAVENOUS | Status: DC | PRN
  Administered 2016-04-14: 50 mg via INTRATRACHEAL

## 2016-04-14 MED ORDER — PROTAMINE SULFATE 10 MG/ML IV SOLN
INTRAVENOUS | Status: DC | PRN
  Administered 2016-04-14: 40 mg via INTRAVENOUS

## 2016-04-14 MED ORDER — FENTANYL CITRATE (PF) 100 MCG/2ML IJ SOLN: 25.0000 ug | INTRAMUSCULAR | Status: DC | PRN

## 2016-04-14 MED ORDER — HYDROXYZINE HCL 10 MG PO TABS
10.0000 mg | ORAL_TABLET | Freq: Three times a day (TID) | ORAL | Status: DC | PRN
  Administered 2016-04-14: 10 mg via ORAL
  Filled 2016-04-14: qty 1

## 2016-04-14 MED ORDER — LIDOCAINE HCL (PF) 1 % IJ SOLN
INTRAMUSCULAR | Status: DC | PRN
  Administered 2016-04-14: 60 mL

## 2016-04-14 MED ORDER — HYDROMORPHONE HCL 1 MG/ML IJ SOLN
0.2500 mg | INTRAMUSCULAR | Status: DC | PRN
  Administered 2016-04-14 (×2): 0.5 mg via INTRAVENOUS

## 2016-04-14 MED ORDER — ONDANSETRON HCL 4 MG/2ML IJ SOLN
INTRAMUSCULAR | Status: AC
  Filled 2016-04-14: qty 2

## 2016-04-14 MED ORDER — HEPARIN SODIUM (PORCINE) 1000 UNIT/ML IJ SOLN
INTRAMUSCULAR | Status: AC
  Filled 2016-04-14: qty 1

## 2016-04-14 MED ORDER — OXYCODONE HCL 5 MG PO TABS
ORAL_TABLET | ORAL | Status: AC
  Filled 2016-04-14: qty 1

## 2016-04-14 MED ORDER — LIDOCAINE HCL (PF) 1 % IJ SOLN
INTRAMUSCULAR | Status: AC
  Filled 2016-04-14: qty 30

## 2016-04-14 MED ORDER — PHENYLEPHRINE HCL 10 MG/ML IJ SOLN
INTRAMUSCULAR | Status: DC | PRN
  Administered 2016-04-14 (×2): 80 ug via INTRAVENOUS

## 2016-04-14 MED ORDER — PROPOFOL 10 MG/ML IV BOLUS
INTRAVENOUS | Status: AC
  Filled 2016-04-14: qty 20

## 2016-04-14 MED ORDER — DARBEPOETIN ALFA 200 MCG/0.4ML IJ SOSY
200.0000 ug | PREFILLED_SYRINGE | Freq: Once | INTRAMUSCULAR | Status: AC
Start: 2016-04-14 — End: 2016-04-14
  Administered 2016-04-14: 200 ug via SUBCUTANEOUS
  Filled 2016-04-14: qty 0.4

## 2016-04-14 MED ORDER — PHENYLEPHRINE 40 MCG/ML (10ML) SYRINGE FOR IV PUSH (FOR BLOOD PRESSURE SUPPORT)
PREFILLED_SYRINGE | INTRAVENOUS | Status: AC
  Filled 2016-04-14: qty 10

## 2016-04-14 MED ORDER — HEPARIN SODIUM (PORCINE) 1000 UNIT/ML IJ SOLN
INTRAMUSCULAR | Status: DC | PRN
  Administered 2016-04-14: 8000 [IU] via INTRAVENOUS

## 2016-04-14 MED ORDER — PROPOFOL 500 MG/50ML IV EMUL
INTRAVENOUS | Status: DC | PRN
  Administered 2016-04-14: 45 ug/kg/min via INTRAVENOUS

## 2016-04-14 MED ORDER — FENTANYL CITRATE (PF) 100 MCG/2ML IJ SOLN
INTRAMUSCULAR | Status: DC | PRN
  Administered 2016-04-14: 25 ug via INTRAVENOUS
  Administered 2016-04-14: 50 ug via INTRAVENOUS

## 2016-04-14 MED ORDER — MILK AND MOLASSES ENEMA
1.0000 | Freq: Once | RECTAL | Status: DC
  Filled 2016-04-14: qty 250

## 2016-04-14 MED ORDER — HYDROMORPHONE HCL 1 MG/ML IJ SOLN
INTRAMUSCULAR | Status: AC
  Filled 2016-04-14: qty 0.5

## 2016-04-14 MED ORDER — 0.9 % SODIUM CHLORIDE (POUR BTL) OPTIME
TOPICAL | Status: DC | PRN
  Administered 2016-04-14: 1000 mL

## 2016-04-14 MED ORDER — PROTAMINE SULFATE 10 MG/ML IV SOLN
INTRAVENOUS | Status: AC
  Filled 2016-04-14: qty 5

## 2016-04-14 MED ORDER — HEPARIN SODIUM (PORCINE) 5000 UNIT/ML IJ SOLN
INTRAMUSCULAR | Status: DC | PRN
  Administered 2016-04-14: 09:00:00

## 2016-04-14 MED ORDER — ONDANSETRON HCL 4 MG/2ML IJ SOLN
INTRAMUSCULAR | Status: DC | PRN
  Administered 2016-04-14: 4 mg via INTRAVENOUS

## 2016-04-14 MED ORDER — SODIUM CHLORIDE 0.9 % IV SOLN
INTRAVENOUS | Status: DC
  Administered 2016-04-14 (×3): via INTRAVENOUS

## 2016-04-14 MED ORDER — HYDROMORPHONE HCL 1 MG/ML IJ SOLN
INTRAMUSCULAR | Status: AC
  Administered 2016-04-14: 0.5 mg via INTRAVENOUS
  Filled 2016-04-14: qty 0.5

## 2016-04-14 MED ORDER — FENTANYL CITRATE (PF) 250 MCG/5ML IJ SOLN
INTRAMUSCULAR | Status: AC
  Filled 2016-04-14: qty 5

## 2016-04-14 SURGICAL SUPPLY — 35 items
ARMBAND PINK RESTRICT EXTREMIT (MISCELLANEOUS) ×3 IMPLANT
CANISTER SUCT 3000ML PPV (MISCELLANEOUS) ×3 IMPLANT
CANNULA VESSEL 3MM 2 BLNT TIP (CANNULA) ×3 IMPLANT
CLIP TI MEDIUM 6 (CLIP) ×3 IMPLANT
CLIP TI WIDE RED SMALL 6 (CLIP) ×6 IMPLANT
COVER PROBE W GEL 5X96 (DRAPES) IMPLANT
DECANTER SPIKE VIAL GLASS SM (MISCELLANEOUS) ×6 IMPLANT
DERMABOND ADVANCED (GAUZE/BANDAGES/DRESSINGS) ×2
DERMABOND ADVANCED .7 DNX12 (GAUZE/BANDAGES/DRESSINGS) ×4 IMPLANT
ELECT REM PT RETURN 9FT ADLT (ELECTROSURGICAL) ×3
ELECTRODE REM PT RTRN 9FT ADLT (ELECTROSURGICAL) ×2 IMPLANT
GLOVE BIO SURGEON STRL SZ 6.5 (GLOVE) ×6 IMPLANT
GLOVE BIO SURGEON STRL SZ7.5 (GLOVE) ×6 IMPLANT
GLOVE BIOGEL PI IND STRL 6.5 (GLOVE) ×2 IMPLANT
GLOVE BIOGEL PI IND STRL 7.0 (GLOVE) ×6 IMPLANT
GLOVE BIOGEL PI IND STRL 7.5 (GLOVE) ×2 IMPLANT
GLOVE BIOGEL PI IND STRL 8 (GLOVE) ×2 IMPLANT
GLOVE BIOGEL PI INDICATOR 6.5 (GLOVE) ×1
GLOVE BIOGEL PI INDICATOR 7.0 (GLOVE) ×3
GLOVE BIOGEL PI INDICATOR 7.5 (GLOVE) ×1
GLOVE BIOGEL PI INDICATOR 8 (GLOVE) ×1
GOWN STRL REUS W/ TWL LRG LVL3 (GOWN DISPOSABLE) ×8 IMPLANT
GOWN STRL REUS W/TWL LRG LVL3 (GOWN DISPOSABLE) ×4
KIT BASIN OR (CUSTOM PROCEDURE TRAY) ×3 IMPLANT
KIT ROOM TURNOVER OR (KITS) ×3 IMPLANT
NS IRRIG 1000ML POUR BTL (IV SOLUTION) ×3 IMPLANT
PACK CV ACCESS (CUSTOM PROCEDURE TRAY) ×3 IMPLANT
PAD ARMBOARD 7.5X6 YLW CONV (MISCELLANEOUS) ×6 IMPLANT
SPONGE SURGIFOAM ABS GEL 100 (HEMOSTASIS) IMPLANT
SUT PROLENE 6 0 BV (SUTURE) ×3 IMPLANT
SUT VIC AB 3-0 SH 27 (SUTURE) ×1
SUT VIC AB 3-0 SH 27X BRD (SUTURE) ×2 IMPLANT
SUT VICRYL 4-0 PS2 18IN ABS (SUTURE) ×6 IMPLANT
UNDERPAD 30X30 (UNDERPADS AND DIAPERS) ×3 IMPLANT
WATER STERILE IRR 1000ML POUR (IV SOLUTION) ×3 IMPLANT

## 2016-04-14 NOTE — Anesthesia Postprocedure Evaluation (Signed)
Anesthesia Post Note  Patient: [name]  Procedure(s) Performed: Procedure(s) (LRB): ARTERIOVENOUS (AV) FISTULA CREATION (Left) LIGATION OF ARTERIOVENOUS  FISTULA (Left)  Patient location during evaluation: PACU Anesthesia Type: MAC Level of consciousness: awake Pain management: pain level controlled Vital Signs Assessment: post-procedure vital signs reviewed and stable Respiratory status: spontaneous breathing Cardiovascular status: stable Anesthetic complications: no       Last Vitals:  Vitals:   04/14/16 1055 04/14/16 1110  BP: 104/66 119/73  Pulse: 78 77  Resp: 16   Temp:      Last Pain:  Vitals:   04/14/16 1100  TempSrc:   PainSc: 0-No pain                 Mareena Cavan

## 2016-04-14 NOTE — Progress Notes (Signed)
    Assessment/Plan: 1 ESRD wants to go to [facility].  Accepted at [facility]  Start date and Time 04/16/16 1st treatment 11 Tuesday,Thursday , Saturday  2 HTN lower vol 3 anemia esa/Fe 4 HPTH vit D 5 obesity 6 DM  P Next HD Thursday as OP.  BP better today on ACE-I            Subjective: Interval History: Felt bad at end and post treatment yesterday.  Prob BP shift  Objective: Vital signs in last 24 hours: Temp:  [98 F (36.7 C)-98.6 F (37 C)] 98 F (36.7 C) (04/10 1006) Pulse Rate:  [77-99] 77 (04/10 1110) Resp:  [16-22] 16 (04/10 1055) BP: (104-149)/(65-90) 119/73 (04/10 1110) SpO2:  [92 %-100 %] 97 % (04/10 1110) Weight:  [103.3 kg (227 lb 12.8 oz)] 103.3 kg (227 lb 12.8 oz) (04/10 0500) Weight change: -0.801 kg (-1 lb 12.3 oz)  Intake/Output from previous day: 04/09 0701 - 04/10 0700 In: 360 [P.O.:360] Out: 3000  Intake/Output this shift: Total I/O In: 500 [I.V.:500] Out: 10 [Blood:10]  General appearance: alert, cooperative and post op  Extremities: left BC AVF with trill  Lab Results:  Recent Labs  04/13/16 0815 04/14/16 0207  WBC 6.8 7.2  HGB 7.6* 8.2*  HCT 23.9* 26.9*  PLT 307 325   BMET:  Recent Labs  04/13/16 0815 04/14/16 0207  NA 136 134*  K 4.1 3.5  CL 105 95*  CO2 22 29  GLUCOSE 102* 105*  BUN 32* 12  CREATININE 5.35* 3.63*  CALCIUM 8.4* 8.1*   No results for input(s): PTH in the last 72 hours. Iron Studies: No results for input(s): IRON, TIBC, TRANSFERRIN, FERRITIN in the last 72 hours. Studies/Results: No results found.  Scheduled: . allopurinol  100 mg Oral Daily  . amLODipine  10 mg Oral QHS  . aspirin  81 mg Oral Daily  . atorvastatin  40 mg Oral Daily  . calcitRIOL  0.25 mcg Oral Q M,W,F  . calcium acetate  1,334 mg Oral TID WC  . darbepoetin (ARANESP) injection - DIALYSIS  200 mcg Intravenous Q Tue-HD  . heparin  5,000 Units Subcutaneous Q8H  . HYDROmorphone      . lisinopril  10 mg Oral Daily  .  milk and molasses  1 enema Rectal Once  . milk and molasses  1 enema Rectal Once  . multivitamin with minerals  1 tablet Oral Daily  . oxyCODONE      . pantoprazole  40 mg Oral Daily  . polyethylene glycol  17 g Oral Daily  . senna  2 tablet Oral QHS  . sertraline  50 mg Oral Daily     LOS: 12 days   Decklyn Hornik C 04/14/2016,1:50 PM

## 2016-04-14 NOTE — Interval H&P Note (Signed)
History and Physical Interval Note:  04/14/2016 7:54 AM  Patricia [name]  has presented today for surgery, with the diagnosis of End stage renal disease N18.6  The various methods of treatment have been discussed with the patient and family. After consideration of risks, benefits and other options for treatment, the patient has consented to  Procedure(s): ARTERIOVENOUS (AV) FISTULA CREATION (Left) as a surgical intervention .  The patient's history has been reviewed, patient examined, no change in status, stable for surgery.  I have reviewed the patient's chart and labs.  Questions were answered to the patient's satisfaction.     [name]

## 2016-04-14 NOTE — Anesthesia Preprocedure Evaluation (Signed)
Anesthesia Evaluation  Patient identified by MRN, date of birth, ID band Patient awake    Reviewed: Allergy & Precautions, NPO status   Airway Mallampati: II  TM Distance: >3 FB     Dental   Pulmonary shortness of breath,    breath sounds clear to auscultation       Cardiovascular hypertension,  Rhythm:Regular Rate:Normal     Neuro/Psych    GI/Hepatic Neg liver ROS, GERD  ,  Endo/Other  diabetes  Renal/GU Renal disease     Musculoskeletal   Abdominal   Peds  Hematology   Anesthesia Other Findings   Reproductive/Obstetrics                             Anesthesia Physical Anesthesia Plan  ASA: III  Anesthesia Plan: MAC   Post-op Pain Management:    Induction: Intravenous  Airway Management Planned: Simple Face Mask  Additional Equipment:   Intra-op Plan:   Post-operative Plan:   Informed Consent: I have reviewed the patients History and Physical, chart, labs and discussed the procedure including the risks, benefits and alternatives for the proposed anesthesia with the patient or authorized representative who has indicated his/her understanding and acceptance.   Dental advisory given  Plan Discussed with: CRNA and Anesthesiologist  Anesthesia Plan Comments:         Anesthesia Quick Evaluation

## 2016-04-14 NOTE — H&P (View-Only) (Signed)
[facility]  Subjective  - In HD   Objective (!) 166/88 92 98.2 F (36.8 C) 17 99%  Intake/Output Summary (Last 24 hours) at 04/13/16 0728 Last data filed at 04/13/16 0414  Gross per 24 hour  Intake              360 ml  Output                0 ml  Net              360 ml    Assessment/Planning: Plan left upper arm av fistula by Dr. Scot Dock tomorrow.  Patient in agreement. NPO past MN    [name] [facility] 04/13/2016 7:28 AM --  Laboratory Lab Results:  Recent Labs  04/10/16 0730  WBC 7.8  HGB 7.4*  HCT 23.5*  PLT 246   BMET  Recent Labs  04/10/16 0730  NA 136  K 4.2  CL 104  CO2 23  GLUCOSE 97  BUN 30*  CREATININE 5.96*  CALCIUM 8.1*    COAG Lab Results  Component Value Date   INR 0.98 06/18/2015   INR 1.03 01/18/2015   INR 0.94 09/27/2013   No results found for: PTT

## 2016-04-14 NOTE — Op Note (Signed)
    NAME: Patricia [name]   MRN: [id number] DOB: May 07, 1975    DATE OF OPERATION: 04/14/2016  PREOP DIAGNOSIS: Stage IV chronic kidney disease  POSTOP DIAGNOSIS: Same  PROCEDURE:  1. Ligation of left radiocephalic AV fistula 2. Creation of left brachiocephalic AV fistula  SURGEON: Judeth Cornfield. Scot Dock, MD, FACS  ASSIST: Silva Bandy, [facility]  ANESTHESIA: Local with sedation   EBL: Minimal  INDICATIONS: [name] is a 41 y.o. female who had a left radiocephalic AV fistula which had multiple branches and was not salvageable. I was asked to place an upper arm fistula.  FINDINGS: 4.5 mm upper arm cephalic vein  TECHNIQUE: The patient was taken to the operating room and sedated by anesthesia. The left upper extremity was prepped and draped usual sterile fashion. A small incision was made over the proximal fistula after the skin was anesthetized with 1% lidocaine. Here the vein was dissected free and ligated with a 2-0 silk tie. After the skin was anesthetized, a separate longitudinal incision was made above the antecubital level. Here the upper arm cephalic vein was dissected free and ligated distally. The brachial artery was dissected free beneath the fascia. The patient was heparinized. The brachial artery was clamped proximally and distally and longitudinal arteriotomy was made. The vein was sewn end-to-side to the artery using continuous 6-0 Prolene suture. At the completion was an excellent thrill in the fistula and a palpable radial pulse. The heparin was partially reversed with protamine. The wounds were closed with a deep layer of 3-0 Vicryl the skin closed with 4-0 Vicryl. Sterile dressing was applied. The patient tolerated the procedure well and was transferred to the recovery room in stable condition. All needle and sponge counts were correct.  [name], MD, FACS [facility]  DATE OF DICTATION:   04/14/2016

## 2016-04-14 NOTE — Progress Notes (Signed)
PROGRESS NOTE    Patricia [name]  RKY:[id number] DOB: 1975/08/05 DOA: 04/01/2016 PCP: Hillis Range   Brief Narrative:   41 y.o. 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 She just recently had a nuclear stress tests 8 days ago at [facility] that was noted to be negative for any acute signs of ischemia.   HA and CP thought to be due to HTN urgency/emergency.   BP corrected but patient had very little urine output   It was decided to start dialysis in the hospital. Patient's access was difficult and plan is for revision on Tuesday 4/10. Patient also needs to be clipped.   Assessment & Plan:   Principal Problem:   Chest pain Active Problems:   HTN (hypertension)   DM (diabetes mellitus) (HCC)   Hypercholesterolemia   Nausea and vomiting   ESRD (end stage renal disease) (HCC)   Anemia due to chronic kidney disease   GERD (gastroesophageal reflux disease)   HTN urgency - presented with blood pressure of 180/101, patient reported she had nausea and vomiting and probably was vomiting her BP meds - Better controlled but may need further titration> we'll defer to nephrology. On amlodipine 5 MG at bedtime. -pressures seem much better controlled currently  CKD stage V/new ESRD -  Secondary to minimal change disease, not on HD PTA, but had her fistula ready for dialysis in the left upper extremity. - Nonfunctioning left arm aVF, had dialysis catheter placed by vascular surgery 4/3. - CLIP process [?NW gso] before discharge- did have ligation of left radiocephalic AV fistula and creation of new left radiocephalic AV fistula on 03/20/15  Anemia of renal disease -Anemia of CKD, hemoglobin is 7.7, per patient she is on EPO as outpatient. - Stable. Transfuse if hemoglobin <7. Follow CBCs periodically across HD. Hemoglobin today is 8  0.2  DM -Appears to be very controlled with diabetic diet. -A1c: 5.9 -Sugars are ranging 103 to 105 and wondered if hypoglycemia caused nausea vomiting yesterday -could also have gastroparesis -continue zofran for N/V  N/V - Resolved. -see above  Hypomagnesemia - repleted  Secondary hyperparathyroidism Per nephrology.  Obesity/Body mass index is 35.68 kg/m.   Constipation  - initiated bowel regimen: Senna, MiraLAX and when necessary rectal Dulcolax. - Patient had a large BM 4/8 without enema. Continue bowel regimen. -Will try enema again 4/10  Secondary hyperparathyroidism - Per nephrology.  Hypoalbuminemia - Will consult dietitian.  Consultations: Nephrology Vascular surgery  Procedures Right IJ tunneled dialysis catheter placement by Dr. Donzetta Matters, Vascular surgery on 04/07/16 HD   DVT prophylaxis: SQ Heparin Code Status:Full Code Family Communication: None at bedside.  Disposition Plan: CLIP and permanent access before DC. Possible DC 04/15/16.   Subjective:  Alert pleasant oriented and in nad No current vomit but nasueous and states feels itchy No cp Still sleepy from procedure  ROS: No chest pain, dyspnea, lightheadedness or dizziness.   Objective: Vitals:   04/14/16 0945 04/14/16 1000 04/14/16 1006 04/14/16 1055  BP: 105/65 106/68  104/66  Pulse: 81 82 81 78  Resp: (!) $RemoveB'21 18 17 16  'SrEjIwld$ Temp: 98 F (36.7 C)  98 F (36.7 C)   TempSrc:      SpO2: 97% 96% 97% 92%  Weight:      Height:      Respiratory rate 18 per minute,  temperature 98.26F and oxygen saturation 99%.  Intake/Output Summary (Last 24 hours) at 04/14/16 1105 Last data filed at 04/14/16 0916  Gross per 24 hour  Intake              860 ml  Output             3010 ml  Net            -2150 ml   Filed Weights   04/13/16 0748 04/13/16 1211 04/14/16 0500  Weight: 107.2 kg (236 lb 5.3 oz) 104.2 kg (229 lb 11.5 oz) 103.3 kg (227 lb 12.8 oz)    Examination:  General exam: Pleasant young  female, moderately built and obese, lying comfortably-sleepy Respiratory system: Clear to auscultation. Respiratory effort normal. R IJ HD cath site looks clean and dry.  Cardiovascular system: S1 & S2 heard, RRR. No JVD, murmurs, rubs, gallops or clicks. No pedal edema.Not on telemetry. Gastrointestinal system: Abdomen is nondistended, soft and nontender. No organomegaly or masses felt. Normal bowel sounds heard. Central nervous system: Alert and oriented. No focal neurological deficits.    Data Reviewed: I have personally reviewed following labs and imaging studies  CBC:  Recent Labs Lab 04/08/16 0828 04/10/16 0730 04/13/16 0815 04/14/16 0207  WBC 4.3 7.8 6.8 7.2  HGB 7.8* 7.4* 7.6* 8.2*  HCT 23.8* 23.5* 23.9* 26.9*  MCV 76.3* 77.8* 78.6 78.7  PLT 241 246 307 967   Basic Metabolic Panel:  Recent Labs Lab 04/08/16 0500 04/10/16 0730 04/13/16 0815 04/13/16 2014 04/14/16 0207  NA 134* 136 136  --  134*  K 4.4 4.2 4.1  --  3.5  CL 102 104 105  --  95*  CO2 $Re'25 23 22  'VVf$ --  29  GLUCOSE 105* 97 102*  --  105*  BUN 27* 30* 32*  --  12  CREATININE 4.40* 5.96* 5.35*  --  3.63*  CALCIUM 7.7* 8.1* 8.4*  --  8.1*  MG  --   --   --  2.1  --   PHOS 4.8* 5.4* 5.0*  --   --    GFR: Estimated Creatinine Clearance: 25 mL/min (A) (by C-G formula based on SCr of 3.63 mg/dL (H)). Liver Function Tests:  Recent Labs Lab 04/08/16 0500 04/10/16 0730 04/13/16 0815  ALBUMIN 2.0* 2.0* 2.0*   CBG:  Recent Labs Lab 04/14/16 0734  GLUCAP 103*   Urine analysis:    Component Value Date/Time   COLORURINE YELLOW 04/03/2016 1626   APPEARANCEUR HAZY (A) 04/03/2016 1626   LABSPEC 1.012 04/03/2016 1626   PHURINE 6.0 04/03/2016 1626   GLUCOSEU 150 (A) 04/03/2016 1626   HGBUR NEGATIVE 04/03/2016 1626   BILIRUBINUR NEGATIVE 04/03/2016 1626   KETONESUR NEGATIVE 04/03/2016 1626   PROTEINUR >=300 (A) 04/03/2016 1626   UROBILINOGEN 0.2 09/19/2014 1620   NITRITE NEGATIVE 04/03/2016 1626    LEUKOCYTESUR NEGATIVE 04/03/2016 1626    ) Recent Results (from the past 240 hour(s))  Surgical pcr screen     Status: None   Collection Time: 04/07/16  7:50 AM  Result Value Ref Range Status   MRSA, PCR NEGATIVE NEGATIVE Final   Staphylococcus aureus NEGATIVE NEGATIVE Final    Comment:        The Xpert SA Assay (FDA approved for NASAL specimens in patients over 62 years of age), is one component of a comprehensive surveillance program.  Test performance has been validated by [facility] for patients greater than or equal to 66 year old.  It is not intended to diagnose infection nor to guide or monitor treatment.       Anti-infectives    Start     Dose/Rate Route Frequency Ordered Stop   04/14/16 0830  cefUROXime (ZINACEF) 1.5 g in dextrose 5 % 50 mL IVPB     1.5 g 100 mL/hr over 30 Minutes Intravenous To ShortStay Surgical 04/13/16 0734 04/14/16 0816   04/07/16 0830  ceFAZolin (ANCEF) IVPB 2g/100 mL premix     2 g 200 mL/hr over 30 Minutes Intravenous  Once 04/07/16 0827 04/07/16 0929   04/07/16 0828  ceFAZolin (ANCEF) 2-4 GM/100ML-% IVPB    Comments:  Forte, Lindsi   : cabinet override      04/07/16 3532 04/07/16 0929       Radiology Studies: No results found.      Scheduled Meds: . allopurinol  100 mg Oral Daily  . amLODipine  10 mg Oral QHS  . aspirin  81 mg Oral Daily  . atorvastatin  40 mg Oral Daily  . calcitRIOL  0.25 mcg Oral Q M,W,F  . calcium acetate  1,334 mg Oral TID WC  . darbepoetin (ARANESP) injection - DIALYSIS  200 mcg Intravenous Q Tue-HD  . heparin  5,000 Units Subcutaneous Q8H  . HYDROmorphone      . lisinopril  10 mg Oral Daily  . milk and molasses  1 enema Rectal Once  . multivitamin with minerals  1 tablet Oral Daily  . oxyCODONE      . pantoprazole  40 mg Oral Daily  . polyethylene glycol  17 g Oral Daily  . senna  2 tablet Oral QHS  . sertraline  50 mg Oral Daily   Continuous Infusions: . sodium chloride 10 mL/hr at 04/07/16  0829  . sodium chloride 10 mL/hr at 04/14/16 0746     LOS: 12 days    Time spent: 25 min   Verneita Griffes, MD [facility]   If 7PM-7AM, please contact night-coverage [email or web address] Password [email or web address] 04/14/2016, 11:05 AM

## 2016-04-14 NOTE — Transfer of Care (Signed)
Immediate Anesthesia Transfer of Care Note  Patient: Patricia [name]  Procedure(s) Performed: Procedure(s): ARTERIOVENOUS (AV) FISTULA CREATION (Left) LIGATION OF ARTERIOVENOUS  FISTULA (Left)  Patient Location: PACU  Anesthesia Type:MAC  Level of Consciousness: awake, alert  and oriented  Airway & Oxygen Therapy: Patient Spontanous Breathing  Post-op Assessment: Report given to RN and Post -op Vital signs reviewed and stable  Post vital signs: Reviewed and stable  Last Vitals:  Vitals:   04/13/16 2132 04/14/16 0500  BP: 128/67 126/76  Pulse: 83 86  Resp: (!) 22 18  Temp: 36.7 C 37 C    Last Pain:  Vitals:   04/14/16 0500  TempSrc: Oral  PainSc:       Patients Stated Pain Goal: 3 (28/78/67 6720)  Complications: No apparent anesthesia complications

## 2016-04-14 NOTE — Interval H&P Note (Signed)
History and Physical Interval Note:  04/14/2016 7:33 AM  Patricia [name]  has presented today for surgery, with the diagnosis of End stage renal disease N18.6  The various methods of treatment have been discussed with the patient and family. After consideration of risks, benefits and other options for treatment, the patient has consented to  Procedure(s): ARTERIOVENOUS (AV) FISTULA CREATION (Left) as a surgical intervention .  The patient's history has been reviewed, patient examined, no change in status, stable for surgery.  I have reviewed the patient's chart and labs.  Questions were answered to the patient's satisfaction.     [name]

## 2016-04-14 NOTE — Progress Notes (Signed)
Accepted at [facility]  Start date and Time 04/16/16 1st treatment 11:30 am .Chairtime 11:2a  Tuesday,Thursday , Saturday 2 nd

## 2016-04-15 ENCOUNTER — Encounter (HOSPITAL_COMMUNITY): Payer: Self-pay | Admitting: Vascular Surgery

## 2016-04-15 ENCOUNTER — Telehealth: Payer: Self-pay | Admitting: Vascular Surgery

## 2016-04-15 LAB — CBC
HCT: 27.7 % — ABNORMAL LOW (ref 36.0–46.0)
Hemoglobin: 8.4 g/dL — ABNORMAL LOW (ref 12.0–15.0)
MCH: 24.1 pg — AB (ref 26.0–34.0)
MCHC: 30.3 g/dL (ref 30.0–36.0)
MCV: 79.4 fL (ref 78.0–100.0)
PLATELETS: 321 10*3/uL (ref 150–400)
RBC: 3.49 MIL/uL — ABNORMAL LOW (ref 3.87–5.11)
RDW: 17.9 % — ABNORMAL HIGH (ref 11.5–15.5)
WBC: 6.3 10*3/uL (ref 4.0–10.5)

## 2016-04-15 LAB — RENAL FUNCTION PANEL
Albumin: 2.3 g/dL — ABNORMAL LOW (ref 3.5–5.0)
Anion gap: 13 (ref 5–15)
BUN: 20 mg/dL (ref 6–20)
CALCIUM: 8.5 mg/dL — AB (ref 8.9–10.3)
CO2: 24 mmol/L (ref 22–32)
CREATININE: 4.9 mg/dL — AB (ref 0.44–1.00)
Chloride: 97 mmol/L — ABNORMAL LOW (ref 101–111)
GFR calc Af Amer: 12 mL/min — ABNORMAL LOW (ref >60)
GFR calc non Af Amer: 10 mL/min — ABNORMAL LOW (ref >60)
GLUCOSE: 103 mg/dL — AB (ref 65–99)
Phosphorus: 6.3 mg/dL — ABNORMAL HIGH (ref 2.5–4.6)
Potassium: 3.5 mmol/L (ref 3.5–5.1)
SODIUM: 134 mmol/L — AB (ref 135–145)

## 2016-04-15 MED ORDER — RENA-VITE PO TABS: 1.0000 | ORAL_TABLET | Freq: Every day | ORAL | Status: DC

## 2016-04-15 MED ORDER — AMLODIPINE BESYLATE 10 MG PO TABS: 10.0000 mg | ORAL_TABLET | Freq: Every day | ORAL | 0 refills | Status: DC

## 2016-04-15 MED ORDER — SENNA 8.6 MG PO TABS: 2.0000 | ORAL_TABLET | Freq: Every day | ORAL | 0 refills | Status: DC

## 2016-04-15 NOTE — Telephone Encounter (Signed)
Sched appt 05/13/16; lab at 2:00 and PA at 3:00. Lm on hm# to inform pt and for pt to call back to confirm.

## 2016-04-15 NOTE — Telephone Encounter (Signed)
-----   Message from Denman George, RN sent at 04/14/2016  5:03 PM EDT ----- Regarding: appt. with Dr. Scot Dock can be changed to the PA for the 6 week f/u   ----- Message ----- From: Alvia Grove, PA-C Sent: 04/14/2016   9:25 AM To: Vvs Charge Pool  s/p left brachial-cephalic AV fistula 9/62/95  f/u in 4-6 weeks with PA and duplex  Thanks Maudie Mercury

## 2016-04-15 NOTE — Progress Notes (Signed)
   VASCULAR SURGERY ASSESSMENT & PLAN:   1 Day Post-Op s/p: Left BC AVF. Ligation of Left RC AVF  Fistula with excellent thrill. Palpable left radial pulse  I have arranged F/U in 6 weeks.  Vascular will be available as needed.     SUBJECTIVE:   No complaints.   PHYSICAL EXAM:   Vitals:   04/14/16 1325 04/14/16 1523 04/14/16 2020 04/15/16 0300  BP: 139/76  138/75 (!) 150/77  Pulse:  87 81 86  Resp:      Temp:  98.7 F (37.1 C) 98 F (36.7 C) 98 F (36.7 C)  TempSrc:  Oral Oral Oral  SpO2:  99% 100% 99%  Weight:    229 lb 11.5 oz (104.2 kg)  Height:       Excellent thrill in left upper arm AVF Palpable left radial pulse.   LABS:   Lab Results  Component Value Date   WBC 6.3 04/15/2016   HGB 8.4 (L) 04/15/2016   HCT 27.7 (L) 04/15/2016   MCV 79.4 04/15/2016   PLT 321 04/15/2016   Lab Results  Component Value Date   CREATININE 4.90 (H) 04/15/2016   Lab Results  Component Value Date   INR 1.04 04/14/2016   CBG (last 3)   Recent Labs  04/14/16 0734  GLUCAP 103*    PROBLEM LIST:    Principal Problem:   Chest pain Active Problems:   HTN (hypertension)   DM (diabetes mellitus) (HCC)   Hypercholesterolemia   Nausea and vomiting   ESRD (end stage renal disease) ([place])   Anemia due to chronic kidney disease   GERD (gastroesophageal reflux disease)   CURRENT MEDS:   . allopurinol  100 mg Oral Daily  . amLODipine  10 mg Oral QHS  . aspirin  81 mg Oral Daily  . atorvastatin  40 mg Oral Daily  . calcitRIOL  0.25 mcg Oral Q M,W,F  . calcium acetate  1,334 mg Oral TID WC  . heparin  5,000 Units Subcutaneous Q8H  . lisinopril  10 mg Oral Daily  . milk and molasses  1 enema Rectal Once  . milk and molasses  1 enema Rectal Once  . multivitamin with minerals  1 tablet Oral Daily  . pantoprazole  40 mg Oral Daily  . polyethylene glycol  17 g Oral Daily  . senna  2 tablet Oral QHS  . sertraline  50 mg Oral Daily    Gae Gallop Beeper:  [phone] Office: [phone] 04/15/2016

## 2016-04-15 NOTE — Care Management (Signed)
Case Management Note Initial Note Started By: Jonnie Finner,  Patient Details  Name: Patricia [name] MRN: [id number] Date of Birth: June 20, 1975  Subjective/Objective:   Chest pain, HTN, DM, ESRD, anemia                 Action/Plan: Discharge Planning: NCM spoke to pt at bedside. Active with [facility] PT. Will need resumption of care order. Contacted [facility] Liaison to make aware. Pt considering RW for home. Requesting information on transportation to dialysis. CSW referral for transportation. Pt states she has adult dtr in the home but they work. States she has Medicaid. Will have dtr bring her Medicaid card.    PCP LONG, ASHLEY   Expected Discharge Date:                         Expected Discharge Plan:  [place]  In-House Referral:  Clinical Social Work  Discharge planning Services  CM Consult  Post Acute Care Choice:  Home Health, Resumption of Svcs/PTA Provider Choice offered to:  Patient  DME Arranged:   N/A DME Agency:   N/A  HH Arranged:  PT, RN [place] Agency:  [facility]  Status of Service: Completed  If discussed at [place] of Stay Meetings, dates discussed: , 04/09/2016, 04/14/2016  Additional Comments: 04-15-16 0958 Jacqlyn Krauss, RN,BSN [phone] Pt has been clipped at the [facility]:  Start date and Time 04/16/16 1st treatment 11:30 am .Chairtime 11:20Tuesday,Thursday, Saturday. Pt previously active with [facility] for PT- new orders for [employer]. Adacia with [facility] is aware that pt is being d/c. SOC to begin within 24-48 hours post d/c. No further needs from CM at this time.

## 2016-04-15 NOTE — Discharge Summary (Signed)
Physician Discharge Summary  Kieren Ricci KVQ:[id number] DOB: [birth year] DOA: 04/01/2016  PCP: Tereasa Coop, PA-C  Admit date: 04/01/2016 Discharge date: 04/15/2016  Time spent: 35 minutes  Recommendations for Outpatient Follow-up:  1. Renal panel in about 1 week 2. Need sOP care and [place] 3. Needs OP Nephrology follow-up 4. Will dializye at [facility] [place]-Start date and Time 04/16/16 1st treatment   Discharge Diagnoses:  Principal Problem:   Chest pain Active Problems:   HTN (hypertension)   DM (diabetes mellitus) (HCC)   Hypercholesterolemia   Nausea and vomiting   ESRD (end stage renal disease) ([place])   Anemia due to chronic kidney disease   GERD (gastroesophageal reflux disease)   Discharge Condition: fair  Diet recommendation: renal fluid restrict  Filed Weights   04/13/16 1211 04/14/16 0500 04/15/16 0300  Weight: 104.2 kg (229 lb 11.5 oz) 103.3 kg (227 lb 12.8 oz) 104.2 kg (229 lb 11.5 oz)    History of present illness:   41 y.o.femalewith medical history significant of  HTN,  HLD,  anemia,  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 She just recently had a nuclear stress tests 8 days pta [facility] that was noted to be negative for any acute signs of ischemia.   HA and CP thought to be due to HTN urgency/emergency.   BP corrected but patient had very little urine output   It was decided to start dialysis in the hospital. Patient's access was difficult and plan is for revision on Tuesday 4/10.  Hospital Course:    HTN urgency - presented with blood pressure of 180/101, patient reported she had nausea and vomiting and probably was vomiting her BP meds - Better controlled but may need further titration> we'll defer to nephrology. On lisinopril 10 and amlodipine 10  on d/c home.  Metoprolol d/c this admission to get to EDW -pressures seem much better controlled currently   CKD stage V/new  ESRD -  Secondary to minimal change disease, not on HD PTA, but had her fistula ready for dialysis in the left upper extremity. - Nonfunctioning left arm aVF, had dialysis catheter placed by vascular surgery 4/3. - did have ligation of left radiocephalic AV fistula and creation of new left radiocephalic AV fistula on 6/43/32   Anemia of renal disease -Anemia of CKD, hemoglobin is 7.7, per patient she is on EPO as outpatient. - Stable. Transfuse if hemoglobin <7. Follow CBCs periodically across HD. Hemoglobin today is 8.4 on d/c   DM -Appears to be very controlled with diabetic diet. -A1c: 5.9 -Sugars are ranging 103 to 105 and wondered if hypoglycemia caused nausea vomiting yesterday -could also have gastroparesis but resolved on its own--felt 2/2 to dilaysis   N/V - Resolved. -see above   Hypomagnesemia - repleted   Secondary hyperparathyroidism Per nephrology.   Obesity/Body mass index is 35.68 kg/m.    Constipation  - initiated bowel regimen: Senna, MiraLAX and when necessary rectal Dulcolax. - Patient had a large BM 4/8 without enema. Continue bowel regimen.   Secondary hyperparathyroidism - Per nephrology.   Hypoalbuminemia - Will consult dietitian.   Procedures: Fistula creation 4/10 and ligation of prior fistula   Consultations:  nephro and vacular  Discharge Exam: Vitals:   04/14/16 2020 04/15/16 0300  BP: 138/75 (!) 150/77  Pulse: 81 86  Resp:    Temp: 98 F (36.7 C) 98 F (36.7 C)   Awake  alert eating and drinking, NAD No N/v  General: s1 s2 no m/r/g Cardiovascular: clear Respiratory: clear no added sound  Discharge Instructions   Discharge Instructions    Diet - low sodium heart healthy    Complete by:  As directed    Discharge instructions    Complete by:  As directed    You will need follow up with your pirmary MD as an OP You will need to get refills from your PCP Please get ;labs as an OP You should [facility] as an OP.   Increase activity slowly    Complete by:  As directed      Current Discharge Medication List    START taking these medications   Details  senna (SENOKOT) 8.6 MG TABS tablet Take 2 tablets (17.2 mg total) by mouth at bedtime. Qty: 120 each, Refills: 0      CONTINUE these medications which have CHANGED   Details  amLODipine (NORVASC) 10 MG tablet Take 1 tablet (10 mg total) by mouth daily. Qty: 30 tablet, Refills: 0      CONTINUE these medications which have NOT CHANGED   Details  allopurinol (ZYLOPRIM) 100 MG tablet Take 100 mg by mouth daily.    aspirin 81 MG chewable tablet Chew 1 tablet (81 mg total) by mouth daily.    Aspirin-Acetaminophen-Caffeine (GOODY HEADACHE PO) Take 1 Package by mouth daily as needed (pain).    atorvastatin (LIPITOR) 40 MG tablet Take 40 mg by mouth daily. Refills: 3    calcitRIOL (ROCALTROL) 0.25 MCG capsule Take 0.25 mcg by mouth every Monday, Wednesday, and Friday. Refills: 3    calcium acetate (PHOSLO) 667 MG capsule Take 1,334 mg by mouth See admin instructions. Usually only east once daily    cholecalciferol (VITAMIN D) 400 units TABS tablet Take 400 Units by mouth daily.    diphenhydramine-acetaminophen (TYLENOL PM) 25-500 MG TABS tablet Take 2 tablets by mouth at bedtime as needed (for sleep).     ferrous sulfate 325 (65 FE) MG EC tablet Take 325 mg by mouth daily.    lisinopril (PRINIVIL,ZESTRIL) 10 MG tablet Take 10 mg by mouth daily.    LORazepam (ATIVAN) 0.5 MG tablet Take 0.5 mg by mouth every 8 (eight) hours as needed for anxiety.     magnesium oxide (MAG-OX) 400 MG tablet Take 1 tablet (400 mg total) by mouth daily. Qty: 60 tablet, Refills: 0    metoprolol tartrate (LOPRESSOR) 25 MG tablet Take 1 tablet (25 mg total) by mouth 2 (two) times daily. Qty: 62 tablet, Refills: 0    Multiple Vitamin (MULTIVITAMIN WITH MINERALS) TABS tablet Take 1 tablet by mouth daily.    omeprazole (PRILOSEC) 40 MG capsule  Take 40 mg by mouth daily.      promethazine (PHENERGAN) 25 MG tablet Take 1 tablet (25 mg total) by mouth every 6 (six) hours as needed for nausea or vomiting. Qty: 10 tablet, Refills: 0    sertraline (ZOLOFT) 25 MG tablet Take 50 mg by mouth daily.     temazepam (RESTORIL) 15 MG capsule Take 15 mg by mouth at bedtime as needed for sleep.    colchicine 0.6 MG tablet Take 0.6 mg by mouth every other day.      STOP taking these medications     Darbepoetin Alfa (ARANESP) 300 MCG/0.6ML SOSY injection      potassium chloride (K-DUR) 10 MEQ tablet      sodium bicarbonate 650 MG tablet  torsemide (DEMADEX) 20 MG tablet        Allergies  Allergen Reactions  . Nsaids Other (See Comments)    Cannot take due to Kidney disease  . Tolmetin Other (See Comments)    Cannot take due to Kidney Disease   [place] Follow up.   Specialty:  Home Health Services Why:  Home Health Physical Therapy Contact information: [place] [address] [phone]        [facility] Follow up in 4 week(s).   Why:  Our office will call you to arrange an appointment  Contact information: [address] [place], PA-C.   Specialty:  Physician Assistant Contact information: [place] [address] [phone]            The results of significant diagnostics from this hospitalization (including imaging, microbiology, ancillary and laboratory) are listed below for reference.    Significant Diagnostic Studies: Dg Chest 2 View  Result Date: 04/01/2016 CLINICAL DATA:  Right-sided chest pain and shortness of breath for 1 day EXAM: CHEST  2 VIEW COMPARISON:  02/18/2016 FINDINGS: The heart size and mediastinal contours are within normal limits. Both lungs are clear. The visualized skeletal structures are unremarkable.  IMPRESSION: No active cardiopulmonary disease. Electronically Signed   By: Inez Catalina M.D.   On: 04/01/2016 16:20   Ct Head Wo Contrast  Result Date: 04/01/2016 CLINICAL DATA:  Elevated blood pressure and headaches. EXAM: CT HEAD WITHOUT CONTRAST TECHNIQUE: Contiguous axial images were obtained from the base of the skull through the vertex without intravenous contrast. COMPARISON:  MRI 01/18/2015 FINDINGS: Brain: The ventricles are normal in size and configuration. No extra-axial fluid collections are identified. The gray-white differentiation is normal. No CT findings for acute intracranial process such as hemorrhage or infarction. No mass lesions. The brainstem and cerebellum are grossly normal. Vascular: No hyperdense vessels or aneurysm. Minimal scattered vascular calcifications. Skull: No skull fracture or bone lesion. Sinuses/Orbits: The paranasal sinuses and mastoid air cells are clear. The globes are intact. Other: No scalp lesions. IMPRESSION: Normal head CT. Electronically Signed   By: Marijo Sanes M.D.   On: 04/01/2016 20:19   US Renal  Result Date: 04/03/2016 CLINICAL DATA:  Chronic renal insufficiency.  Initial encounter. EXAM: RENAL / URINARY TRACT ULTRASOUND COMPLETE COMPARISON:  CT of the abdomen pelvis performed 10/01/2015, and abdominal ultrasound performed 05/06/2014 FINDINGS: Right Kidney: Length: 13.0 cm. Diffusely increased parenchymal echogenicity noted. No mass or hydronephrosis visualized. Left Kidney: Length: 12.5 cm. Diffusely increased parenchymal echogenicity noted. No mass or hydronephrosis visualized. Bladder: Appears normal for degree of bladder distention. IMPRESSION: 1. No evidence of hydronephrosis. 2. Changes of medical renal disease again noted. Electronically Signed   By: Garald Balding M.D.   On: 04/03/2016 19:57   Dg Chest Port 1 View  Result Date: 04/07/2016 CLINICAL DATA:  Dialysis catheter placement EXAM: PORTABLE CHEST 1 VIEW COMPARISON:  04/01/2016 FINDINGS:  1107 hours. Stable asymmetric elevation right hemidiaphragm. Lungs are clear. The cardio pericardial silhouette is enlarged. Right IJ dialysis catheter is new in the interval with distal tip overlying the upper right atrium. No evidence for right-sided pneumothorax. Telemetry leads overlie the chest. IMPRESSION: No pneumothorax status post right IJ dialysis catheter placement. Electronically Signed   By: Misty Stanley M.D.   On: 04/07/2016 11:21  Microbiology: Recent Results (from the past 240 hour(s))  Surgical pcr screen     Status: None   Collection Time: 04/07/16  7:50 AM  Result Value Ref Range Status   MRSA, PCR NEGATIVE NEGATIVE Final   Staphylococcus aureus NEGATIVE NEGATIVE Final    Comment:        The Xpert SA Assay (FDA approved for NASAL specimens in patients over 58 years of age), is one component of a comprehensive surveillance program.  Test performance has been validated by [facility] for patients greater than or equal to 96 year old. It is not intended to diagnose infection nor to guide or monitor treatment.      Labs: Basic Metabolic Panel:  Recent Labs Lab 04/10/16 0730 04/13/16 0815 04/13/16 2014 04/14/16 0207 04/15/16 0414  NA 136 136  --  134* 134*  K 4.2 4.1  --  3.5 3.5  CL 104 105  --  95* 97*  CO2 23 22  --  29 24  GLUCOSE 97 102*  --  105* 103*  BUN 30* 32*  --  12 20  CREATININE 5.96* 5.35*  --  3.63* 4.90*  CALCIUM 8.1* 8.4*  --  8.1* 8.5*  MG  --   --  2.1  --   --   PHOS 5.4* 5.0*  --   --  6.3*   Liver Function Tests:  Recent Labs Lab 04/10/16 0730 04/13/16 0815 04/15/16 0414  ALBUMIN 2.0* 2.0* 2.3*   No results for input(s): LIPASE, AMYLASE in the last 168 hours. No results for input(s): AMMONIA in the last 168 hours. CBC:  Recent Labs Lab 04/10/16 0730 04/13/16 0815 04/14/16 0207 04/15/16 0414  WBC 7.8 6.8 7.2 6.3  HGB 7.4* 7.6* 8.2* 8.4*  HCT 23.5* 23.9* 26.9* 27.7*  MCV 77.8* 78.6 78.7 79.4  PLT 246 307 325  321   Cardiac Enzymes:  Recent Labs Lab 04/13/16 1411 04/13/16 2014 04/14/16 0207  TROPONINI <0.03 <0.03 <0.03   BNP: BNP (last 3 results)  Recent Labs  09/04/15 2236  BNP 8.2    ProBNP (last 3 results) No results for input(s): PROBNP in the last 8760 hours.  CBG:  Recent Labs Lab 04/14/16 0734  GLUCAP 103*       Signed:  Nita Sells MD   [facility] 04/15/2016, 9:15 AM

## 2016-04-30 ENCOUNTER — Encounter: Payer: Self-pay | Admitting: Vascular Surgery

## 2016-05-11 ENCOUNTER — Emergency Department (HOSPITAL_COMMUNITY)
Admission: EM | Admit: 2016-05-11 | Discharge: 2016-05-12 | Disposition: A | Discharge status: Home / Self Care | Payer: Medicare Other | Attending: Emergency Medicine | Admitting: Emergency Medicine

## 2016-05-11 ENCOUNTER — Emergency Department (HOSPITAL_COMMUNITY): Payer: Medicare Other

## 2016-05-11 ENCOUNTER — Encounter (HOSPITAL_COMMUNITY): Payer: Self-pay | Admitting: Emergency Medicine

## 2016-05-11 DIAGNOSIS — I471 Supraventricular tachycardia: Secondary | ICD-10-CM | POA: Insufficient documentation

## 2016-05-11 DIAGNOSIS — I120 Hypertensive chronic kidney disease with stage 5 chronic kidney disease or end stage renal disease: Secondary | ICD-10-CM | POA: Diagnosis not present

## 2016-05-11 DIAGNOSIS — E1122 Type 2 diabetes mellitus with diabetic chronic kidney disease: Secondary | ICD-10-CM | POA: Diagnosis not present

## 2016-05-11 DIAGNOSIS — N186 End stage renal disease: Secondary | ICD-10-CM | POA: Insufficient documentation

## 2016-05-11 DIAGNOSIS — Z7982 Long term (current) use of aspirin: Secondary | ICD-10-CM | POA: Diagnosis not present

## 2016-05-11 DIAGNOSIS — Z79899 Other long term (current) drug therapy: Secondary | ICD-10-CM | POA: Insufficient documentation

## 2016-05-11 DIAGNOSIS — R002 Palpitations: Secondary | ICD-10-CM | POA: Diagnosis present

## 2016-05-11 DIAGNOSIS — R079 Chest pain, unspecified: Secondary | ICD-10-CM

## 2016-05-11 LAB — CBC
HEMATOCRIT: 29.9 % — AB (ref 36.0–46.0)
Hemoglobin: 9.1 g/dL — ABNORMAL LOW (ref 12.0–15.0)
MCH: 24.9 pg — AB (ref 26.0–34.0)
MCHC: 30.4 g/dL (ref 30.0–36.0)
MCV: 81.9 fL (ref 78.0–100.0)
PLATELETS: 338 10*3/uL (ref 150–400)
RBC: 3.65 MIL/uL — AB (ref 3.87–5.11)
RDW: 17.9 % — ABNORMAL HIGH (ref 11.5–15.5)
WBC: 8.1 10*3/uL (ref 4.0–10.5)

## 2016-05-11 LAB — PHOSPHORUS: Phosphorus: 3.3 mg/dL (ref 2.5–4.6)

## 2016-05-11 LAB — BASIC METABOLIC PANEL
ANION GAP: 12 (ref 5–15)
BUN: 53 mg/dL — ABNORMAL HIGH (ref 6–20)
CHLORIDE: 99 mmol/L — AB (ref 101–111)
CO2: 24 mmol/L (ref 22–32)
CREATININE: 7.19 mg/dL — AB (ref 0.44–1.00)
Calcium: 9.1 mg/dL (ref 8.9–10.3)
GFR calc non Af Amer: 6 mL/min — ABNORMAL LOW (ref >60)
GFR, EST AFRICAN AMERICAN: 7 mL/min — AB (ref >60)
Glucose, Bld: 97 mg/dL (ref 65–99)
POTASSIUM: 3.9 mmol/L (ref 3.5–5.1)
SODIUM: 135 mmol/L (ref 135–145)

## 2016-05-11 LAB — TROPONIN I: Troponin I: <0.03 ng/mL (ref <0.03)

## 2016-05-11 LAB — I-STAT TROPONIN, ED: Troponin i, poc: 0 ng/mL (ref 0.00–0.08)

## 2016-05-11 LAB — MAGNESIUM: MAGNESIUM: 1.7 mg/dL (ref 1.7–2.4)

## 2016-05-11 MED ORDER — ASPIRIN 81 MG PO CHEW: 324.0000 mg | CHEWABLE_TABLET | Freq: Once | ORAL | Status: DC

## 2016-05-11 NOTE — ED Provider Notes (Signed)
[facility] DEPT Provider Note   CSN: [id number] Arrival date & time: 05/11/16  1916     History   Chief Complaint Chief Complaint  Patient presents with  . Chest Pain    HPI Patricia [name] is a 41 y.o. female.  The history is provided by the patient.  Palpitations   This is a new problem. The current episode started less than 1 hour ago. The problem occurs rarely. The problem has been resolved. Associated with: cooking dinner. Episode Length: less than 1 hr. Associated symptoms include near-syncope and shortness of breath. Pertinent negatives include no fever, no syncope, no abdominal pain, no nausea, no vomiting, no lower extremity edema and no dizziness. She has tried nothing for the symptoms.    Past Medical History:  Diagnosis Date  . Acid reflux   . Chronic kidney disease (CKD), stage III (moderate)   . Headache(784.0)    "related to chemo; sometimes weekly" (09/12/2013)  . High cholesterol   . History of blood transfusion "a couple"   "related to low counts"  . Hypertension   . Iron deficiency anemia    "get epogen shots q month" (02/20/2014)  . 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 Type II diabetes mellitus ([place])    "took me off my RX ~ 04/2013"    Patient Active Problem List   Diagnosis Date Noted  . ESRD (end stage renal disease) ([place]) 04/01/2016  . Anemia due to chronic kidney disease 04/01/2016  . GERD (gastroesophageal reflux disease) 04/01/2016  . Hypomagnesemia 04/28/2015  . Acute renal failure superimposed on stage 3 chronic kidney disease ([place]) 04/12/2014  . Atypical chest pain   . Chest pain 02/19/2014  . Hypokalemia 02/19/2014  . Nephrotic syndrome 10/02/2013  . Dyslipidemia 10/02/2013  . PAT (paroxysmal atrial tachycardia) ([place]) 09/29/2013  . CKD stage 3 followed at [facility] 09/12/2013  . Symptomatic anemia 06/29/2013  . Edema 06/29/2013  . Nausea and vomiting 06/29/2013  . Fever,  unspecified 01/31/2013  . Glomerulonephritis 01/28/2013  . Acute renal failure ([place]) 01/28/2013  . Syncope 01/28/2013  . Chest pain with moderate risk of acute coronary syndrome 07/04/2011  . Shortness of breath 07/04/2011  . Morbid obesity ([place]) 07/04/2011  . HTN (hypertension) 07/04/2011  . DM (diabetes mellitus) ([place]) 07/04/2011  . Hypercholesterolemia 07/04/2011    Past Surgical History:  Procedure Laterality Date  . ABDOMINAL HYSTERECTOMY  2010   "laparoscopic"  . ANKLE FRACTURE SURGERY Right 1994  . AV FISTULA PLACEMENT Left 04/14/2016   Procedure: ARTERIOVENOUS (AV) FISTULA CREATION;  Surgeon: Angelia Mould, MD;  Location: [facility];  Service: Vascular;  Laterality: Left;  . CARDIAC CATHETERIZATION  2000's  . FISTULOGRAM Left 04/07/2016   Procedure: FISTULOGRAM;  Surgeon: Waynetta Sandy, MD;  Location: [facility];  Service: Vascular;  Laterality: Left;  . FRACTURE SURGERY    . INSERTION OF DIALYSIS CATHETER Right 04/07/2016   Procedure: INSERTION OF DIALYSIS CATHETER;  Surgeon: Waynetta Sandy, MD;  Location: [facility];  Service: Vascular;  Laterality: Right;  . LIGATION OF ARTERIOVENOUS  FISTULA Left 04/14/2016   Procedure: LIGATION OF ARTERIOVENOUS  FISTULA;  Surgeon: Angelia Mould, MD;  [facility];  Service: Vascular;  Laterality: Left;    OB History    Gravida Para Term Preterm AB Living   3             SAB TAB Ectopic Multiple Live Births  3         Home Medications    Prior to Admission medications   Medication Sig Start Date End Date Taking? Authorizing Provider  allopurinol (ZYLOPRIM) 100 MG tablet Take 100 mg by mouth daily.   Yes [provider]  aspirin 81 MG chewable tablet Chew 1 tablet (81 mg total) by mouth daily. 10/03/13  Yes Eugenie Filler, MD  atorvastatin (LIPITOR) 40 MG tablet Take 40 mg by mouth daily. 03/14/14  Yes [provider]  calcium acetate (PHOSLO) 667 MG capsule Take 1,334 mg by mouth 3  (three) times daily with meals.  02/24/16 02/23/17 Yes [provider]  diphenhydramine-acetaminophen (TYLENOL PM) 25-500 MG TABS tablet Take 2 tablets by mouth at bedtime as needed (for sleep).    Yes [provider]  lisinopril (PRINIVIL,ZESTRIL) 10 MG tablet Take 10 mg by mouth daily.   Yes [provider]  LORazepam (ATIVAN) 0.5 MG tablet Take 0.5 mg by mouth every 8 (eight) hours as needed for anxiety.  07/11/15  Yes [provider]  metoprolol tartrate (LOPRESSOR) 25 MG tablet Take 25 mg by mouth daily. 04/17/16  Yes [provider]  multivitamin (RENA-VIT) TABS tablet Take 1 tablet by mouth daily.   Yes [provider]  omeprazole (PRILOSEC) 40 MG capsule Take 40 mg by mouth daily.     Yes [provider]  senna (SENOKOT) 8.6 MG TABS tablet Take 2 tablets (17.2 mg total) by mouth at bedtime. 04/15/16  Yes Nita Sells, MD  sertraline (ZOLOFT) 50 MG tablet Take 50 mg by mouth daily. 04/24/16  Yes [provider]  temazepam (RESTORIL) 15 MG capsule Take 15 mg by mouth at bedtime as needed for sleep.   Yes [provider]  amLODipine (NORVASC) 10 MG tablet Take 1 tablet (10 mg total) by mouth daily. Patient not taking: Reported on 05/11/2016 04/15/16   Nita Sells, MD  magnesium oxide (MAG-OX) 400 MG tablet Take 1 tablet (400 mg total) by mouth daily. Patient not taking: Reported on 05/11/2016 04/29/15   Rama, Venetia Maxon, MD  promethazine (PHENERGAN) 25 MG tablet Take 1 tablet (25 mg total) by mouth every 6 (six) hours as needed for nausea or vomiting. Patient not taking: Reported on 05/11/2016 01/23/15   Margarita Mail, PA-C    Family History Family History  Problem Relation Age of Onset  . Hypertension Mother   . Thyroid disease Mother   . Coronary artery disease Father   . Hypertension Father   . Diabetes Father     Social History Social History  Substance Use Topics  . Smoking status: Never  Smoker  . Smokeless tobacco: Never Used  . Alcohol use No     Allergies   Nsaids and Tolmetin   Review of Systems Review of Systems  Constitutional: Negative for fever.  HENT: Negative.   Respiratory: Positive for shortness of breath.   Cardiovascular: Positive for palpitations and near-syncope. Negative for syncope.  Gastrointestinal: Negative for abdominal pain, nausea and vomiting.  Neurological: Negative for dizziness.  All other systems reviewed and are negative.    Physical Exam Updated Vital Signs BP 109/64   Pulse 72   Temp 97.9 F (36.6 C) (Oral)   Resp 17   Ht $R'5\' 7"'uE$  (1.702 m)   Wt 103 kg   SpO2 100%   BMI 35.55 kg/m   Physical Exam  Constitutional: She is oriented to person, place, and time. She appears well-developed and well-nourished. No distress.  HENT:  Head: Normocephalic and atraumatic.  Mouth/Throat: Oropharynx is clear and moist.  Eyes: EOM are normal.  Neck: No JVD present.  Cardiovascular: Normal rate, regular rhythm, normal heart sounds and intact distal pulses.   No murmur heard. Pulmonary/Chest: Effort normal and breath sounds normal. No respiratory distress.  Perm cath in place right upper chest c/d/I and nontender  Abdominal: Soft. She exhibits no distension. There is no tenderness. There is no guarding.  Musculoskeletal: Normal range of motion. She exhibits no edema or tenderness.  Neurological: She is alert and oriented to person, place, and time. No cranial nerve deficit. She exhibits normal muscle tone. Coordination normal.  Skin: Skin is warm and dry. No rash noted. She is not diaphoretic. No erythema. No pallor.  Nursing note and vitals reviewed.    ED Treatments / Results  Labs (all labs ordered are listed, but only abnormal results are displayed) Labs Reviewed  BASIC METABOLIC PANEL - Abnormal; Notable for the following:       Result Value   Chloride 99 (*)    BUN 53 (*)    Creatinine, Ser 7.19 (*)    GFR calc non Af  Amer 6 (*)    GFR calc Af Amer 7 (*)    All other components within normal limits  CBC - Abnormal; Notable for the following:    RBC 3.65 (*)    Hemoglobin 9.1 (*)    HCT 29.9 (*)    MCH 24.9 (*)    RDW 17.9 (*)    All other components within normal limits  MAGNESIUM  PHOSPHORUS  TROPONIN I  I-STAT TROPOININ, ED    EKG  EKG Interpretation  Date/Time:  Monday May 11 2016 19:22:33 EDT Ventricular Rate:  85 PR Interval:    QRS Duration: 94 QT Interval:  378 QTC Calculation: 450 R Axis:   41 Text Interpretation:  Sinus rhythm ST elev, probable normal early repol pattern Confirmed by DELO  MD, DOUGLAS ([id number]) on 05/11/2016 8:03:54 PM Also confirmed by Stark Jock  MD, DOUGLAS ([id number]), editor Hattie Perch [phone])  on 05/12/2016 7:36:15 AM       Radiology Dg Chest Port 1 View  Result Date: 05/11/2016 CLINICAL DATA:  Left chest pain EXAM: PORTABLE CHEST 1 VIEW COMPARISON:  March 07, 2016 FINDINGS: The heart size and mediastinal contours are stable. The heart size is enlarged. Right central venous line is identified unchanged. Both lungs are clear. The visualized skeletal structures are unremarkable. IMPRESSION: No active cardiopulmonary disease. Electronically Signed   By: Abelardo Diesel M.D.   On: 05/11/2016 19:41    Procedures Procedures (including critical care time)  Medications Ordered in ED Medications - No data to display   Initial Impression / Assessment and Plan / ED Course  I have reviewed the triage vital signs and the nursing notes.  Pertinent labs & imaging results that were available during my care of the patient were reviewed by me and considered in my medical decision making (see chart for details).     Patient is a 41 year old female with above past medical history presents with episode of SVT. Occurred one hour prior to arrival. She felt palpitations associated with some sharp chest pain, shortness breath, dizziness, nausea. Found to have SVT with a heart rate  190 and narrow complex tachycardia on rhythm strip. Patient was hemodynamically stable and spontaneously converted upon going into the ambulance. Upon arrival she is chest pain-free with normal sinus rhythm and no acute ischemic changes. Exam and  vitals as above. Given history, blood work obtained which show unremarkable chemistry and cell counts and negative initial troponin. Chest x-ray without acute findings. Given reported chest pain will obtain delta troponin. Patient is a heart score of 3.  Delta trop neg.  I have reviewed all labs. Patient stable for discharge home.  I have reviewed all results with the patient. Advised to f/u with cardiology within 3 days. Patient agrees to stated plan. All questions answered. Advised to call or return to have any questions, new symptoms, change in symptoms, or symptoms that they do not understand.   Final Clinical Impressions(s) / ED Diagnoses   Final diagnoses:  Chest pain  SVT (supraventricular tachycardia) [facility])    New Prescriptions Discharge Medication List as of 05/11/2016 11:47 PM       Heriberto Antigua, MD 05/12/16 Raye Sorrow    Veryl Speak, MD 05/12/16 2151

## 2016-05-11 NOTE — ED Triage Notes (Signed)
BIB EMS from home, approx 6pm felt "heart racing" pt noted to be in SVT HR 160's and also had near syncopal episode. Pt converted back to NSR before admin of medication. Pt then reported L sided CP 6/10. Given 324 ASA and 1NTG. VSS. A/OX4.

## 2016-05-13 ENCOUNTER — Ambulatory Visit (HOSPITAL_COMMUNITY)
Admit: 2016-05-13 | Discharge: 2016-05-13 | Disposition: A | Discharge status: Home / Self Care | Payer: Medicare Other | Attending: Vascular Surgery | Admitting: Vascular Surgery

## 2016-05-13 ENCOUNTER — Ambulatory Visit (INDEPENDENT_AMBULATORY_CARE_PROVIDER_SITE_OTHER): Payer: Self-pay | Admitting: Physician Assistant

## 2016-05-13 VITALS — BP 102/68 | HR 84 | Temp 97.7°F | Resp 16 | Ht 67.0 in | Wt 229.0 lb

## 2016-05-13 DIAGNOSIS — N186 End stage renal disease: Secondary | ICD-10-CM | POA: Diagnosis not present

## 2016-05-13 DIAGNOSIS — Z992 Dependence on renal dialysis: Secondary | ICD-10-CM

## 2016-05-13 DIAGNOSIS — Z48812 Encounter for surgical aftercare following surgery on the circulatory system: Secondary | ICD-10-CM | POA: Diagnosis present

## 2016-05-13 NOTE — Progress Notes (Signed)
POST OPERATIVE OFFICE NOTE    CC:  F/u for surgery  HPI:  This is a 41 y.o. female who is s/p ligation of left RC AVF and creation of left brachiocephalic AVF on 7/35/32 by Dr. Scot Dock.  She previously had right TDC in the right IJ on 04/07/16 by Dr. Donzetta Matters.    She states she has done well since surgery.  She denies any pain or numbness in her left hand.    She dialyzes T/T/S at the [facility] location in [place].  She states that on Monday, she had about an hour and a half episode of increased heart rate.  She stated that she started to feel bad and checked her rate and it was 167.  EMS was called and at one point, she states that her heart rate was as high as 190.  She did have some chest pressure with this event.  It had never happened before this.  She states that after the IV was started and they were about to give her medicine, she converted.  She states she has not had any further sx since then.  She was instructed to call the cardiologist and make an appointment.  She has not yet called to make appointment.    Allergies  Allergen Reactions  . Nsaids Other (See Comments)    Cannot take due to Kidney disease  . Tolmetin Other (See Comments)    Cannot take due to Kidney Disease    Current Outpatient Prescriptions  Medication Sig Dispense Refill  . allopurinol (ZYLOPRIM) 100 MG tablet Take 100 mg by mouth daily.    Marland Kitchen amLODipine (NORVASC) 10 MG tablet Take 1 tablet (10 mg total) by mouth daily. (Patient not taking: Reported on 05/11/2016) 30 tablet 0  . aspirin 81 MG chewable tablet Chew 1 tablet (81 mg total) by mouth daily.    Marland Kitchen atorvastatin (LIPITOR) 40 MG tablet Take 40 mg by mouth daily.  3  . calcium acetate (PHOSLO) 667 MG capsule Take 1,334 mg by mouth 3 (three) times daily with meals.     . diphenhydramine-acetaminophen (TYLENOL PM) 25-500 MG TABS tablet Take 2 tablets by mouth at bedtime as needed (for sleep).     Marland Kitchen lisinopril (PRINIVIL,ZESTRIL) 10 MG tablet Take 10 mg by  mouth daily.    Marland Kitchen LORazepam (ATIVAN) 0.5 MG tablet Take 0.5 mg by mouth every 8 (eight) hours as needed for anxiety.     . magnesium oxide (MAG-OX) 400 MG tablet Take 1 tablet (400 mg total) by mouth daily. (Patient not taking: Reported on 05/11/2016) 60 tablet 0  . metoprolol tartrate (LOPRESSOR) 25 MG tablet Take 25 mg by mouth daily.    . multivitamin (RENA-VIT) TABS tablet Take 1 tablet by mouth daily.    Marland Kitchen omeprazole (PRILOSEC) 40 MG capsule Take 40 mg by mouth daily.      . promethazine (PHENERGAN) 25 MG tablet Take 1 tablet (25 mg total) by mouth every 6 (six) hours as needed for nausea or vomiting. (Patient not taking: Reported on 05/11/2016) 10 tablet 0  . senna (SENOKOT) 8.6 MG TABS tablet Take 2 tablets (17.2 mg total) by mouth at bedtime. 120 each 0  . sertraline (ZOLOFT) 50 MG tablet Take 50 mg by mouth daily.    . temazepam (RESTORIL) 15 MG capsule Take 15 mg by mouth at bedtime as needed for sleep.     No current facility-administered medications for this visit.      ROS:  See HPI  Physical Exam:  Vitals:   05/13/16 1435  BP: 102/68  Pulse: 84  Resp: 16  Temp: 97.7 F (36.5 C)   Vitals:   05/13/16 1435  Weight: 229 lb (103.9 kg)  Height: $Remove'5\' 7"'bFapsvK$  (1.702 m)     Incision:  Well healed Extremities:  +thrill/bruit within the fistula; 2+ left radial pulse; strong grip with left hand.    Left BC AVF duplex 05/13/16: Diameter:  0.62cm-1.19cm Depth:  0.28cm-1.66cm   Assessment/Plan:  This is a 41 y.o. female who is s/p: ligation of left RC AVF and creation of left brachiocephalic AVF on 8/86/77 by Dr. Scot Dock  -pt's fistula has matured nicely, but is too deep and will need superficialization.   -pt had episode of SVT on Monday 05/11/16 and was referred to cardiology.  Will schedule superficialization by Dr. Scot Dock for 3-4 weeks, which will hopefully give her time to get seen by Cardiology prior to her procedure.   -she understands that after her fistula is used 2-3 times  without difficulty, it can be removed. -she dialyzes T/T/S at the [facility].    Leontine Locket, PA-C [facility] [phone]  Clinic MD:  Donzetta Matters

## 2016-05-16 ENCOUNTER — Encounter (HOSPITAL_COMMUNITY): Payer: Self-pay

## 2016-05-16 ENCOUNTER — Emergency Department (HOSPITAL_COMMUNITY)
Admission: EM | Admit: 2016-05-16 | Discharge: 2016-05-16 | Disposition: A | Discharge status: Home / Self Care | Payer: Medicare Other | Attending: Emergency Medicine | Admitting: Emergency Medicine

## 2016-05-16 DIAGNOSIS — E1122 Type 2 diabetes mellitus with diabetic chronic kidney disease: Secondary | ICD-10-CM | POA: Diagnosis not present

## 2016-05-16 DIAGNOSIS — Z79899 Other long term (current) drug therapy: Secondary | ICD-10-CM | POA: Insufficient documentation

## 2016-05-16 DIAGNOSIS — Z992 Dependence on renal dialysis: Secondary | ICD-10-CM | POA: Insufficient documentation

## 2016-05-16 DIAGNOSIS — N186 End stage renal disease: Secondary | ICD-10-CM | POA: Insufficient documentation

## 2016-05-16 DIAGNOSIS — I471 Supraventricular tachycardia: Secondary | ICD-10-CM | POA: Diagnosis not present

## 2016-05-16 DIAGNOSIS — Z7982 Long term (current) use of aspirin: Secondary | ICD-10-CM | POA: Insufficient documentation

## 2016-05-16 DIAGNOSIS — I120 Hypertensive chronic kidney disease with stage 5 chronic kidney disease or end stage renal disease: Secondary | ICD-10-CM | POA: Diagnosis not present

## 2016-05-16 DIAGNOSIS — R002 Palpitations: Secondary | ICD-10-CM | POA: Diagnosis present

## 2016-05-16 LAB — CBC WITH DIFFERENTIAL/PLATELET
BASOS ABS: 0 10*3/uL (ref 0.0–0.1)
BASOS PCT: 0 %
EOS PCT: 1 %
Eosinophils Absolute: 0.1 10*3/uL (ref 0.0–0.7)
HEMATOCRIT: 29.9 % — AB (ref 36.0–46.0)
HEMOGLOBIN: 9 g/dL — AB (ref 12.0–15.0)
Lymphocytes Relative: 25 %
Lymphs Abs: 1.9 10*3/uL (ref 0.7–4.0)
MCH: 25.2 pg — ABNORMAL LOW (ref 26.0–34.0)
MCHC: 30.1 g/dL (ref 30.0–36.0)
MCV: 83.8 fL (ref 78.0–100.0)
MONO ABS: 0.5 10*3/uL (ref 0.1–1.0)
Monocytes Relative: 7 %
Neutro Abs: 4.9 10*3/uL (ref 1.7–7.7)
Neutrophils Relative %: 67 %
Platelets: 298 10*3/uL (ref 150–400)
RBC: 3.57 MIL/uL — ABNORMAL LOW (ref 3.87–5.11)
RDW: 18.5 % — AB (ref 11.5–15.5)
WBC: 7.4 10*3/uL (ref 4.0–10.5)

## 2016-05-16 LAB — COMPREHENSIVE METABOLIC PANEL
ALBUMIN: 3 g/dL — AB (ref 3.5–5.0)
ALT: 14 U/L (ref 14–54)
AST: 23 U/L (ref 15–41)
Alkaline Phosphatase: 45 U/L (ref 38–126)
Anion gap: 9 (ref 5–15)
BILIRUBIN TOTAL: 0.5 mg/dL (ref 0.3–1.2)
BUN: 37 mg/dL — AB (ref 6–20)
CO2: 24 mmol/L (ref 22–32)
Calcium: 8.8 mg/dL — ABNORMAL LOW (ref 8.9–10.3)
Chloride: 104 mmol/L (ref 101–111)
Creatinine, Ser: 6.88 mg/dL — ABNORMAL HIGH (ref 0.44–1.00)
GFR calc Af Amer: 8 mL/min — ABNORMAL LOW (ref >60)
GFR calc non Af Amer: 7 mL/min — ABNORMAL LOW (ref >60)
GLUCOSE: 108 mg/dL — AB (ref 65–99)
Potassium: 4.5 mmol/L (ref 3.5–5.1)
Sodium: 137 mmol/L (ref 135–145)
TOTAL PROTEIN: 6.2 g/dL — AB (ref 6.5–8.1)

## 2016-05-16 LAB — I-STAT TROPONIN, ED: TROPONIN I, POC: 0 ng/mL (ref 0.00–0.08)

## 2016-05-16 LAB — MAGNESIUM: Magnesium: 1.7 mg/dL (ref 1.7–2.4)

## 2016-05-16 NOTE — ED Provider Notes (Signed)
[facility] DEPT Provider Note   CSN: [id number] Arrival date & time: 05/16/16  1224     History   Chief Complaint No chief complaint on file.   HPI Patricia [name] is a 41 y.o. female.  Patient with past medical history of ESRD (TThSa), GERD, hypertension, type 2 diabetes mellitus, presents via EMS with SVT. Patient states she was in the waiting room for her dialysis appointment and began feeling palpitations, sweaty, and light-headed. Palpitations not relieved with Valsalva maneuver. On EMS arrival patient appeared to be in SVT, she was treated with 6 mg of adenosine and converted to normal sinus rhythm. Patient had recent episode of SVT on 05/11/2016 and was seen in ED, she spontaneously converted to normal sinus rhythm. Patient has follow-up cardiology appointment scheduled for Friday with [facility]. Denies cardiac hx, nausea, vomiting, abdominal pain, vision changes, chest pain on exertion.      Past Medical History:  Diagnosis Date  . Acid reflux   . Chronic kidney disease (CKD), stage III (moderate)   . Headache(784.0)    "related to chemo; sometimes weekly" (09/12/2013)  . High cholesterol   . History of blood transfusion "a couple"   "related to low counts"  . Hypertension   . Iron deficiency anemia    "get epogen shots q month" (02/20/2014)  . 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 Type II diabetes mellitus ([place])    "took me off my RX ~ 04/2013"    Patient Active Problem List   Diagnosis Date Noted  . ESRD (end stage renal disease) ([place]) 04/01/2016  . Anemia due to chronic kidney disease 04/01/2016  . GERD (gastroesophageal reflux disease) 04/01/2016  . Hypomagnesemia 04/28/2015  . Acute renal failure superimposed on stage 3 chronic kidney disease ([place]) 04/12/2014  . Atypical chest pain   . Chest pain 02/19/2014  . Hypokalemia 02/19/2014  . Nephrotic syndrome 10/02/2013  . Dyslipidemia  10/02/2013  . PAT (paroxysmal atrial tachycardia) ([place]) 09/29/2013  . CKD stage 3 followed at [facility] 09/12/2013  . Symptomatic anemia 06/29/2013  . Edema 06/29/2013  . Nausea and vomiting 06/29/2013  . Fever, unspecified 01/31/2013  . Glomerulonephritis 01/28/2013  . Acute renal failure ([place]) 01/28/2013  . Syncope 01/28/2013  . Chest pain with moderate risk of acute coronary syndrome 07/04/2011  . Shortness of breath 07/04/2011  . Morbid obesity ([place]) 07/04/2011  . HTN (hypertension) 07/04/2011  . DM (diabetes mellitus) ([place]) 07/04/2011  . Hypercholesterolemia 07/04/2011    Past Surgical History:  Procedure Laterality Date  . ABDOMINAL HYSTERECTOMY  2010   "laparoscopic"  . ANKLE FRACTURE SURGERY Right 1994  . AV FISTULA PLACEMENT Left 04/14/2016   Procedure: ARTERIOVENOUS (AV) FISTULA CREATION;  Surgeon: Angelia Mould, MD;  Location: [facility];  Service: Vascular;  Laterality: Left;  . CARDIAC CATHETERIZATION  2000's  . FISTULOGRAM Left 04/07/2016   Procedure: FISTULOGRAM;  Surgeon: Waynetta Sandy, MD;  Location: [facility];  Service: Vascular;  Laterality: Left;  . FRACTURE SURGERY    . INSERTION OF DIALYSIS CATHETER Right 04/07/2016   Procedure: INSERTION OF DIALYSIS CATHETER;  Surgeon: Waynetta Sandy, MD;  Location: [facility];  Service: Vascular;  Laterality: Right;  . LIGATION OF ARTERIOVENOUS  FISTULA Left 04/14/2016   Procedure: LIGATION OF ARTERIOVENOUS  FISTULA;  Surgeon: Angelia Mould, MD;  Location: [place] [place];  Service: Vascular;  Laterality: Left;    OB History  Gravida Para Term Preterm AB Living   3             SAB TAB Ectopic Multiple Live Births         3         Home Medications    Prior to Admission medications   Medication Sig Start Date End Date Taking? Authorizing Provider  allopurinol (ZYLOPRIM) 100 MG tablet Take 100 mg by mouth daily.   Yes [provider]  aspirin 81 MG chewable tablet Chew 1 tablet (81 mg total) by mouth  daily. 10/03/13  Yes Eugenie Filler, MD  atorvastatin (LIPITOR) 40 MG tablet Take 40 mg by mouth daily. 03/14/14  Yes [provider]  calcium acetate (PHOSLO) 667 MG capsule Take 1,334 mg by mouth 3 (three) times daily with meals.  02/24/16 02/23/17 Yes [provider]  diphenhydramine-acetaminophen (TYLENOL PM) 25-500 MG TABS tablet Take 2 tablets by mouth at bedtime as needed (for sleep).    Yes [provider]  lisinopril (PRINIVIL,ZESTRIL) 10 MG tablet Take 10 mg by mouth daily.   Yes [provider]  LORazepam (ATIVAN) 0.5 MG tablet Take 0.5 mg by mouth every 8 (eight) hours as needed for anxiety.  07/11/15  Yes [provider]  metoprolol tartrate (LOPRESSOR) 25 MG tablet Take 25 mg by mouth daily. 04/17/16  Yes [provider]  multivitamin (RENA-VIT) TABS tablet Take 1 tablet by mouth daily.   Yes [provider]  omeprazole (PRILOSEC) 40 MG capsule Take 40 mg by mouth daily.     Yes [provider]  senna (SENOKOT) 8.6 MG TABS tablet Take 2 tablets (17.2 mg total) by mouth at bedtime. 04/15/16  Yes Nita Sells, MD  sertraline (ZOLOFT) 50 MG tablet Take 50 mg by mouth daily. 04/24/16  Yes [provider]  temazepam (RESTORIL) 15 MG capsule Take 15 mg by mouth at bedtime as needed for sleep.   Yes [provider]  amLODipine (NORVASC) 10 MG tablet Take 1 tablet (10 mg total) by mouth daily. Patient not taking: Reported on 05/13/2016 04/15/16   Nita Sells, MD  magnesium oxide (MAG-OX) 400 MG tablet Take 1 tablet (400 mg total) by mouth daily. Patient not taking: Reported on 05/13/2016 04/29/15   Rama, Venetia Maxon, MD  promethazine (PHENERGAN) 25 MG tablet Take 1 tablet (25 mg total) by mouth every 6 (six) hours as needed for nausea or vomiting. 01/23/15   Margarita Mail, PA-C  sertraline (ZOLOFT) 25 MG tablet  02/17/16   [provider]    Family History Family History  Problem  Relation Age of Onset  . Hypertension Mother   . Thyroid disease Mother   . Coronary artery disease Father   . Hypertension Father   . Diabetes Father     Social History Social History  Substance Use Topics  . Smoking status: Never Smoker  . Smokeless tobacco: Never Used  . Alcohol use No     Allergies   Nsaids and Tolmetin   Review of Systems Review of Systems  Respiratory: Positive for chest tightness and shortness of breath.   Cardiovascular: Positive for palpitations.  Gastrointestinal: Negative for abdominal pain, nausea and vomiting.  Neurological: Positive for light-headedness.     Physical Exam Updated Vital Signs BP (!) 145/77   Pulse 70   Temp 97.9 F (36.6 C) (Oral)   Resp 14   SpO2 100%   Physical Exam  Constitutional: She is oriented to person, place, and  time. She appears well-developed and well-nourished.  HENT:  Head: Normocephalic and atraumatic.  Eyes: Conjunctivae are normal.  Neck: Normal range of motion. Neck supple.  Cardiovascular: Normal rate, regular rhythm, normal heart sounds and intact distal pulses.  Exam reveals no friction rub.   No murmur heard. Pulmonary/Chest: Effort normal and breath sounds normal. No respiratory distress. She has no wheezes. She has no rales.  Abdominal: Soft. Bowel sounds are normal. She exhibits no distension. There is no tenderness. There is no guarding.  Musculoskeletal: She exhibits no edema.  Neurological: She is alert and oriented to person, place, and time.  Psychiatric: She has a normal mood and affect. Her behavior is normal.  Nursing note and vitals reviewed.    ED Treatments / Results  Labs (all labs ordered are listed, but only abnormal results are displayed) Labs Reviewed  COMPREHENSIVE METABOLIC PANEL - Abnormal; Notable for the following:       Result Value   Glucose, Bld 108 (*)    BUN 37 (*)    Creatinine, Ser 6.88 (*)    Calcium 8.8 (*)    Total Protein 6.2 (*)    Albumin 3.0 (*)     GFR calc non Af Amer 7 (*)    GFR calc Af Amer 8 (*)    All other components within normal limits  CBC WITH DIFFERENTIAL/PLATELET - Abnormal; Notable for the following:    RBC 3.57 (*)    Hemoglobin 9.0 (*)    HCT 29.9 (*)    MCH 25.2 (*)    RDW 18.5 (*)    All other components within normal limits  MAGNESIUM  I-STAT TROPOININ, ED    EKG  EKG Interpretation  Date/Time:  Saturday May 16 2016 13:59:00 EDT Ventricular Rate:  70 PR Interval:    QRS Duration: 91 QT Interval:  414 QTC Calculation: 447 R Axis:   51 Text Interpretation:  Sinus rhythm ST elev, probable normal early repol pattern No significant change since last tracing Confirmed by [facility]  MD-J, JON ([id number]) on 05/16/2016 2:13:36 PM Also confirmed by [facility]  MD-J, JON [phone]), editor Oswaldo Milian, [place] ([id number])  on 05/16/2016 3:43:12 PM       Radiology No results found.  Procedures Procedures (including critical care time)  Medications Ordered in ED Medications - No data to display   Initial Impression / Assessment and Plan / ED Course  I have reviewed the triage vital signs and the nursing notes.  Pertinent labs & imaging results that were available during my care of the patient were reviewed by me and considered in my medical decision making (see chart for details).     Patient with second episode of SVT. Patient cardioverted with adenosine prior to arrival. CMP and CBC without change from baseline. Initially patient did not have chest pain, however while in ED, experienced left-sided chest pain. EKG was performed immediately without acute change. Troponin negative. Cardiology consulted; Dr. Haroldine Laws suggests to increase PTA metoprolol to BID and order holter monitor, pt to follow up outpt w cardiology. Patient did not receive dialysis today, nephrology consulted, Dr. Mercy Moore, who scheduled her to receive dialysis here in Hospital prior to discharge. Pt in NSR prior to leaving ED for dialysis. Pt will be  discharged home from dialysis.  Patient discussed with and seen by Dr. Tomi Bamberger.  Discussed results, findings, treatment and follow up. Patient advised of return precautions. Patient verbalized understanding and agreed with plan.  Final Clinical Impressions(s) / ED Diagnoses  Final diagnoses:  SVT (supraventricular tachycardia) [facility])    New Prescriptions New Prescriptions   No medications on file     Russo, [place] N, PA-C 05/16/16 Sharene Skeans, MD 05/17/16 [phone]

## 2016-05-16 NOTE — Discharge Instructions (Signed)
Please read instructions below. Begin taking your metoprolol twice daily. Schedule an appointment with Dr. Clayborne Dana office, Cardiology, for follow-up. Return to the ER for worsening chest pain with exertion, shortness of breath, palpitations, or new or worsening symptoms.

## 2016-05-16 NOTE — ED Triage Notes (Signed)
Pt presents with onset of rapid heart rate while waiting for dialysis treatment.  [facility] noted SVT in 160-170s, pt given $Remove'6mg'pVLpcAp$  of adenisone with conversion to NSR in 80s.

## 2016-05-16 NOTE — Progress Notes (Signed)
Pt was discharged home after HD tx per MD order in stable condition. Pt was wheeled to ED waiting area to be picked up by daughter,

## 2016-05-16 NOTE — ED Notes (Signed)
Pt c/o increasing CP 6/10 on left side. EDP made aware, new orders for EKG and I-stat trop. Placed.

## 2016-05-16 NOTE — ED Notes (Signed)
ED Provider at bedside. 

## 2016-05-19 ENCOUNTER — Telehealth: Payer: Self-pay | Admitting: Physician Assistant

## 2016-05-21 NOTE — Telephone Encounter (Signed)
close encounter °

## 2016-05-22 ENCOUNTER — Ambulatory Visit: Payer: Medicare Other | Admitting: Physician Assistant

## 2016-05-27 ENCOUNTER — Ambulatory Visit (INDEPENDENT_AMBULATORY_CARE_PROVIDER_SITE_OTHER): Payer: Medicare Other

## 2016-05-27 DIAGNOSIS — I471 Supraventricular tachycardia: Secondary | ICD-10-CM

## 2016-06-03 ENCOUNTER — Ambulatory Visit (INDEPENDENT_AMBULATORY_CARE_PROVIDER_SITE_OTHER): Payer: Medicare Other | Admitting: Physician Assistant

## 2016-06-03 ENCOUNTER — Encounter: Payer: Self-pay | Admitting: Physician Assistant

## 2016-06-03 VITALS — BP 98/60 | HR 67 | Ht 67.0 in | Wt 235.4 lb

## 2016-06-03 DIAGNOSIS — E785 Hyperlipidemia, unspecified: Secondary | ICD-10-CM | POA: Diagnosis not present

## 2016-06-03 DIAGNOSIS — I10 Essential (primary) hypertension: Secondary | ICD-10-CM | POA: Diagnosis not present

## 2016-06-03 DIAGNOSIS — E119 Type 2 diabetes mellitus without complications: Secondary | ICD-10-CM

## 2016-06-03 DIAGNOSIS — N186 End stage renal disease: Secondary | ICD-10-CM | POA: Diagnosis not present

## 2016-06-03 DIAGNOSIS — I471 Supraventricular tachycardia: Secondary | ICD-10-CM | POA: Diagnosis not present

## 2016-06-03 NOTE — Patient Instructions (Signed)
Medication Instructions:   STOP lisinopril  Please check your blood pressures daily. Patricia [name] will call you on Friday to get these readings and determine if other adjustments to your medications are needed.  Labwork:  none   Testing/Procedures: none  Follow-Up: 3 months with Dr. Sallyanne Kuster   If you need a refill on your cardiac medications before your next appointment, please call your pharmacy.

## 2016-06-03 NOTE — Progress Notes (Signed)
Cardiology Office Note    Date:  06/04/2016   ID:  Patricia [name], DOB November 25, 1975, MRN [id number]  PCP:  Blair Heys, PA-C  Cardiologist:  Dr. Sallyanne Kuster  Chief Complaint  Patient presents with  . Follow-up    pt c/o fluttering and palpitation in chest    History of Present Illness:  Patricia [name] is a 41 y.o. female with past medical history of hypertension, ESRD on TTS, DM 2, hyperlipidemia. The only time she was seen in cardiology office was by Kerin Ransom in October 2015 as follow-up to the previous hospitalization. He was seen by Dr. Sallyanne Kuster in the hospital on 09/28/2013 for evaluation of chest pain. More recently, she was evaluated by Dr. Acie Fredrickson on 06/18/2015 for left-sided chest pain as well. 2 day Myoview obtained at the time she was low risk with no ischemia, EF greater than 65%. Echocardiogram obtained on the same day showed EF 55-60%, mild LVH, trivial TR and mildly dilated left atrium. For the last 6 month, she has been treated in the emergency room 6 times and admitted at least once. She was admitted for hypertensive urgency on 04/01/2016. Troponin was negative, her creatinine was 4.86. She did have left radiocephalic fistula placed in [facility] back in October 2017, initial attempt to use the AV fistula resulted in infiltration.  Right IJ tunneled dialysis catheter was placed on 04/07/2016. Left arm fistulogram showed the fistula was deep from the skin with multiple branches. She was taken back to the OR on 04/14/2016 and underwent ligation of nonfunctioning radial cephalic AV fistula and creation of left brachiocephalic AV fistula. More recently, she was treated in the ED on 05/16/2016 with SVT during dialysis. She was converted to normal sinus rhythm on a single dose of adenosine. I was able to locate the strip in EPIC which showed SVT.  Patient presents today for cardiology office visit. She is currently on 25 mg metoprolol tartrate once a day every night. She is also taking her 10  mg lisinopril on nightly basis as well. Given the fact that she has already reached end-stage renal disease, lisinopril probably will not give her as much benefit as before. Her blood pressure is very soft with systolic blood pressure of 98 today, I will discontinue her lisinopril. She will continue to monitor her blood pressure twice a day for the next 3 days, I will call her this Friday if her systolic blood pressure is greater than 105, then my recommendation is to increase to metoprolol to 25 mg twice a day. She may hold her morning metoprolol on the day of dialysis every Tuesday, Thursday and Saturday. She is also currently wearing a 30 day event monitor that was started on 05/27/2016. She can follow-up in 3 month. She currently has 30 seconds of palpitation once to twice a day, however after increasing on the metoprolol, I expect her symptoms to become a lot less frequently.   Past Medical History:  Diagnosis Date  . Acid reflux   . Chronic kidney disease (CKD), stage III (moderate)   . Headache(784.0)    "related to chemo; sometimes weekly" (09/12/2013)  . High cholesterol   . History of blood transfusion "a couple"   "related to low counts"  . Hypertension   . Iron deficiency anemia    "get epogen shots q month" (02/20/2014)  . 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 Type II diabetes mellitus ([place])    "  took me off my RX ~ 04/2013"    Past Surgical History:  Procedure Laterality Date  . ABDOMINAL HYSTERECTOMY  2010   "laparoscopic"  . ANKLE FRACTURE SURGERY Right 1994  . AV FISTULA PLACEMENT Left 04/14/2016   Procedure: ARTERIOVENOUS (AV) FISTULA CREATION;  Surgeon: Angelia Mould, MD;  Location: [place];  Service: Vascular;  Laterality: Left;  . CARDIAC CATHETERIZATION  2000's  . FISTULOGRAM Left 04/07/2016   Procedure: FISTULOGRAM;  Surgeon: Waynetta Sandy, MD;  Location: [place];  Service: Vascular;  Laterality:  Left;  . FRACTURE SURGERY    . INSERTION OF DIALYSIS CATHETER Right 04/07/2016   Procedure: INSERTION OF DIALYSIS CATHETER;  Surgeon: Waynetta Sandy, MD;  Location: [place];  Service: Vascular;  Laterality: Right;  . LIGATION OF ARTERIOVENOUS  FISTULA Left 04/14/2016   Procedure: LIGATION OF ARTERIOVENOUS  FISTULA;  Surgeon: Angelia Mould, MD;  Location: [facility] OR;  Service: Vascular;  Laterality: Left;    Current Medications: Outpatient Medications Prior to Visit  Medication Sig Dispense Refill  . allopurinol (ZYLOPRIM) 100 MG tablet Take 100 mg by mouth daily.    Marland Kitchen aspirin 81 MG chewable tablet Chew 1 tablet (81 mg total) by mouth daily.    Marland Kitchen atorvastatin (LIPITOR) 40 MG tablet Take 40 mg by mouth daily.  3  . calcium acetate (PHOSLO) 667 MG capsule Take 1,334 mg by mouth 3 (three) times daily with meals.     . diphenhydramine-acetaminophen (TYLENOL PM) 25-500 MG TABS tablet Take 2 tablets by mouth at bedtime as needed (for sleep).     . LORazepam (ATIVAN) 0.5 MG tablet Take 0.5 mg by mouth every 8 (eight) hours as needed for anxiety.     . metoprolol tartrate (LOPRESSOR) 25 MG tablet Take 25 mg by mouth daily.    . multivitamin (RENA-VIT) TABS tablet Take 1 tablet by mouth daily.    . promethazine (PHENERGAN) 25 MG tablet Take 1 tablet (25 mg total) by mouth every 6 (six) hours as needed for nausea or vomiting. 10 tablet 0  . senna (SENOKOT) 8.6 MG TABS tablet Take 2 tablets (17.2 mg total) by mouth at bedtime. 120 each 0  . sertraline (ZOLOFT) 50 MG tablet Take 50 mg by mouth daily.    Marland Kitchen lisinopril (PRINIVIL,ZESTRIL) 10 MG tablet Take 10 mg by mouth daily.    Marland Kitchen omeprazole (PRILOSEC) 40 MG capsule Take 40 mg by mouth daily.      . temazepam (RESTORIL) 15 MG capsule Take 15 mg by mouth at bedtime as needed for sleep.    Marland Kitchen amLODipine (NORVASC) 10 MG tablet Take 1 tablet (10 mg total) by mouth daily. (Patient not taking: Reported on 05/13/2016) 30 tablet 0  . magnesium oxide (MAG-OX)  400 MG tablet Take 1 tablet (400 mg total) by mouth daily. (Patient not taking: Reported on 05/13/2016) 60 tablet 0  . sertraline (ZOLOFT) 25 MG tablet      No facility-administered medications prior to visit.      Allergies:   Nsaids and Tolmetin   Social History   Social History  . Marital status: Single    Spouse name: N/A  . Number of children: N/A  . Years of education: N/A   Social History Main Topics  . Smoking status: Never Smoker  . Smokeless tobacco: Never Used  . Alcohol use No  . Drug use: No  . Sexual activity: Not Currently    Birth control/ protection: Surgical   Other Topics Concern  .  None   Social History Narrative  . None     Family History:  The patient's family history includes Coronary artery disease in her father; Diabetes in her father; Hypertension in her father and mother; Thyroid disease in her mother.   ROS:   Please see the history of present illness.    ROS All other systems reviewed and are negative.   PHYSICAL EXAM:   VS:  BP 98/60   Pulse 67   Ht $R'5\' 7"'Df$  (1.702 m)   Wt 235 lb 6.4 oz (106.8 kg)   BMI 36.87 kg/m    GEN: Well nourished, well developed, in no acute distress  HEENT: normal  Neck: no JVD, carotid bruits, or masses Cardiac: RRR; no murmurs, rubs, or gallops,no edema  Respiratory:  clear to auscultation bilaterally, normal work of breathing GI: soft, nontender, nondistended, + BS MS: no deformity or atrophy  Skin: warm and dry, no rash Neuro:  Alert and Oriented x 3, Strength and sensation are intact Psych: euthymic mood, full affect  Wt Readings from Last 3 Encounters:  06/03/16 235 lb 6.4 oz (106.8 kg)  05/16/16 229 lb 11.5 oz (104.2 kg)  05/13/16 229 lb (103.9 kg)      Studies/Labs Reviewed:   EKG:  EKG is ordered today.  The ekg ordered today demonstrates Normal sinus rhythm with T-wave inversion in V1 through V3.  Recent Labs: 06/18/2015: TSH 1.973 09/04/2015: B Natriuretic Peptide 8.2 05/16/2016: ALT 14;  BUN 37; Creatinine, Ser 6.88; Hemoglobin 9.0; Magnesium 1.7; Platelets 298; Potassium 4.5; Sodium 137   Lipid Panel    Component Value Date/Time   CHOL 196 06/18/2015 0708   TRIG 265 (H) 06/18/2015 0708   HDL 34 (L) 06/18/2015 0708   CHOLHDL 5.8 06/18/2015 0708   VLDL 53 (H) 06/18/2015 0708   LDLCALC 109 (H) 06/18/2015 0708    Additional studies/ records that were reviewed today include:   Myoview 06/19/2015  There was no ST segment deviation noted during stress.  No T wave inversion was noted during stress.  The study is normal.  This is a low risk study.  The left ventricular ejection fraction is hyperdynamic (>65%).   Echo 06/19/2015 ------------------------------------------------------------------- LV EF: 55% -   60%  ------------------------------------------------------------------- Indications:      Chest pain 786.51.  ------------------------------------------------------------------- History:   Risk factors:  Hypertension. Diabetes mellitus.  ------------------------------------------------------------------- Study Conclusions  - Left ventricle: The cavity size was normal. Wall thickness was   increased in a pattern of mild LVH. Systolic function was normal.   The estimated ejection fraction was in the range of 55% to 60%.   Wall motion was normal; there were no regional wall motion   abnormalities. Left ventricular diastolic function parameters   were normal. - Mitral valve: Calcified annulus. - Left atrium: The atrium was mildly dilated. - Tricuspid valve: There was trivial regurgitation.   ASSESSMENT:    1. PAT (paroxysmal atrial tachycardia) ([place])   2. ESRD (end stage renal disease) ([place])   3. Essential hypertension   4. Controlled type 2 diabetes mellitus without complication, without long-term current use of insulin ([place])   5. Hyperlipidemia, unspecified hyperlipidemia type      PLAN:  In order of problems listed above:  1. SVT: Her  blood pressure is borderline at this point, I am only able to up titrate beta blocker. She is currently on 10 mg lisinopril, since she has recently reached end-stage renal disease, ACE inhibitor may not offer her much benefit  by this point. I will discontinue lisinopril, if her blood pressure is able to maintain at greater than 638 systolic, I will change her 25 mg daily of metoprolol titrate to twice a day dosing. She may hold her morning blood pressure medication on the day of dialysis.  2. ESRD on HD TTS: will try to avoid hypotension with BB uptitration  3. HTN: Discontinue lisinopril, if her blood pressure is able to maintain adequate greater than 466 systolic, I will increase her metoprolol titrate to 25 mg twice a day. She will need to hold morning dose of metoprolol on the day of dialysis to avoid hypotension  4. HLD: Continue Lipitor 40 mg daily  5. DM II: I do not see she is on any diabetic medication. We'll defer to primary care physician. Recent Hgb A1C was 5.9, this make her prediabetic instead    Medication Adjustments/Labs and Tests Ordered: Current medicines are reviewed at length with the patient today.  Concerns regarding medicines are outlined above.  Medication changes, Labs and Tests ordered today are listed in the Patient Instructions below. Patient Instructions  Medication Instructions:   STOP lisinopril  Please check your blood pressures daily. Isaac Laud will call you on Friday to get these readings and determine if other adjustments to your medications are needed.  Labwork:  none   Testing/Procedures: none  Follow-Up: 3 months with Dr. Sallyanne Kuster   If you need a refill on your cardiac medications before your next appointment, please call your pharmacy.      Hilbert Corrigan, [place]  06/04/2016 3:43 PM    [facility] [place], [place], [place]  [address] Phone: [phone]; Fax: [phone]

## 2016-06-04 ENCOUNTER — Encounter: Payer: Self-pay | Admitting: Physician Assistant

## 2016-06-05 ENCOUNTER — Telehealth: Payer: Self-pay | Admitting: Physician Assistant

## 2016-06-05 NOTE — Telephone Encounter (Signed)
I have called Mrs. Patricia [name], her SBP after stopping ACEI is 104-106. Will add 12.$RemoveBef'5mg'BRAFAVphlR$  metoprolol to AM and continue $RemoveBefo'25mg'hjFeQCpZNSI$  metoprolol in PM. She will hold AM dose on days of dialysis to avoid running into hypotensive episodes. She will need to continue to observe her blood pressure and let us know if her SBP drop too low.   Also I have cleared her for vascular surgery.   Hilbert Corrigan PA Pager: [phone]

## 2016-06-08 ENCOUNTER — Telehealth: Payer: Self-pay

## 2016-06-08 NOTE — Telephone Encounter (Signed)
1. Type of surgery: superficialization left brachiophalic AVF 2. Date of surgery: 06/12/16 3. Surgeon: Dr Nino Glow 4. Medications that need to be held & how long: N/A 5. Fax and/or Phone: (p) [phone] ([phone]  Patient cleared for surgery per Almyra Deforest, PA. 'Patient is stable during cardiology office visit on 06/03/16. She is cleared for surgery. Medication will be adjustede to control SVT better. But no other workup needed from cardiac perspective.  Medical Records department faxed form to [facility] today, 06/08/16.

## 2016-06-09 ENCOUNTER — Other Ambulatory Visit: Payer: Self-pay | Admitting: *Deleted

## 2016-06-10 ENCOUNTER — Encounter: Payer: Self-pay | Admitting: Vascular Surgery

## 2016-06-17 ENCOUNTER — Encounter (HOSPITAL_COMMUNITY): Payer: Self-pay | Admitting: *Deleted

## 2016-06-18 NOTE — Progress Notes (Signed)
Anesthesia Chart Review:  Pt is a same day work up.   Pt is a 42 year old female scheduled for revision superficialization of L brachiocephalic AV fistula on 3/43/5686 with [name], MD  - PCP is Eddie North. Marcell Anger, [place] (notes in care everywhere)  - Cardiologist is Sanda Klein, MD. Last office visit 06/03/16 with Almyra Deforest, PA who cleared pt for surgery.   PMH includes:  HTN, DM, hyperlipidemia, ESRD, iron deficiency anemia, GERD. Never smoker. BMI in PCP notes listed as 36.4. S/p L AV fistula creation 04/14/16. S/p insertion of dialysis catheter 04/07/16.   - ED visit 05/16/16 for SVT. Converted with 6 mg adenosine.  - ED visit 05/11/16 for palpitations. Found to be in SVT with and HR of 190, spontaneously converted while an ambulance.  - Hospitalized 3/28-4/11/18 for hypertensive urgency. Complicated by progression of renal disease to ESRD, dialysis initiated.  Medications include: ASA 81 mg, Lipitor, metoprolol, Prilosec  Labs will be obtained DOS  1 view CXR 05/11/16: No active cardiopulmonary disease.  EKG 06/03/16: NSR. Cannot rule out anterior infarct, age undetermined.  Cardiac event monitor for 30 days currently underway.   Nuclear stress test 03/24/16 (care everywhere):  - Low risk study - There is a small in size, subtle in severity, partially reversible defect involving the apical segment. This is suggestive of artifact and less likely due to subtle ischemia. - Post stress: Global systolic function is normal. The ejection fraction calculated at 60%.  - No significant coronary calcifications were noted on the attenuation CT - Sensitivity and specificity of this test are reduced by the noted attenuation - Sensitivity and specificity of this test are reduced by the noted increased BMI - In the future, for larger patients or those with attenuation issues, consider ordering PET rubidium study  Echo 02/20/16 (care everywhere): - Normal left ventricular systolic function,  ejection fraction > 55% - Normal right ventricular systolic function - No significant valvular abnormalities  If labs acceptable DOS, I anticipate patient can proceed as scheduled.  Willeen Cass, FNP-BC [facility]/Anesthesiology Phone: [phone] 06/18/2016 4:30 PM

## 2016-06-19 ENCOUNTER — Ambulatory Visit (HOSPITAL_COMMUNITY)
Admission: RE | Admit: 2016-06-19 | Discharge: 2016-06-19 | Disposition: A | Discharge status: Home / Self Care | Payer: Medicare Other | Source: Ambulatory Visit | Attending: Vascular Surgery | Admitting: Vascular Surgery

## 2016-06-19 ENCOUNTER — Ambulatory Visit (HOSPITAL_COMMUNITY): Payer: Medicare Other | Admitting: Emergency Medicine

## 2016-06-19 ENCOUNTER — Telehealth: Payer: Self-pay | Admitting: Vascular Surgery

## 2016-06-19 ENCOUNTER — Encounter (HOSPITAL_COMMUNITY)
Admission: RE | Disposition: A | Discharge status: Home / Self Care | Payer: Self-pay | Source: Ambulatory Visit | Attending: Vascular Surgery

## 2016-06-19 DIAGNOSIS — T82898A Other specified complication of vascular prosthetic devices, implants and grafts, initial encounter: Secondary | ICD-10-CM | POA: Insufficient documentation

## 2016-06-19 DIAGNOSIS — N186 End stage renal disease: Secondary | ICD-10-CM | POA: Insufficient documentation

## 2016-06-19 DIAGNOSIS — E785 Hyperlipidemia, unspecified: Secondary | ICD-10-CM | POA: Insufficient documentation

## 2016-06-19 DIAGNOSIS — I120 Hypertensive chronic kidney disease with stage 5 chronic kidney disease or end stage renal disease: Secondary | ICD-10-CM | POA: Insufficient documentation

## 2016-06-19 DIAGNOSIS — N185 Chronic kidney disease, stage 5: Secondary | ICD-10-CM

## 2016-06-19 DIAGNOSIS — Z79899 Other long term (current) drug therapy: Secondary | ICD-10-CM | POA: Diagnosis not present

## 2016-06-19 DIAGNOSIS — Z992 Dependence on renal dialysis: Secondary | ICD-10-CM | POA: Insufficient documentation

## 2016-06-19 DIAGNOSIS — K219 Gastro-esophageal reflux disease without esophagitis: Secondary | ICD-10-CM | POA: Insufficient documentation

## 2016-06-19 DIAGNOSIS — Z7982 Long term (current) use of aspirin: Secondary | ICD-10-CM | POA: Insufficient documentation

## 2016-06-19 DIAGNOSIS — E1122 Type 2 diabetes mellitus with diabetic chronic kidney disease: Secondary | ICD-10-CM | POA: Insufficient documentation

## 2016-06-19 DIAGNOSIS — Y832 Surgical operation with anastomosis, bypass or graft as the cause of abnormal reaction of the patient, or of later complication, without mention of misadventure at the time of the procedure: Secondary | ICD-10-CM | POA: Diagnosis not present

## 2016-06-19 HISTORY — PX: REVISON OF ARTERIOVENOUS FISTULA: SHX6074

## 2016-06-19 HISTORY — DX: Anxiety disorder, unspecified: F41.9

## 2016-06-19 HISTORY — PX: PATCH ANGIOPLASTY: SHX6230

## 2016-06-19 LAB — POCT I-STAT 4, (NA,K, GLUC, HGB,HCT)
GLUCOSE: 98 mg/dL (ref 65–99)
HEMATOCRIT: 35 % — AB (ref 36.0–46.0)
HEMOGLOBIN: 11.9 g/dL — AB (ref 12.0–15.0)
POTASSIUM: 4.4 mmol/L (ref 3.5–5.1)
SODIUM: 139 mmol/L (ref 135–145)

## 2016-06-19 LAB — GLUCOSE, CAPILLARY: GLUCOSE-CAPILLARY: 93 mg/dL (ref 65–99)

## 2016-06-19 SURGERY — REVISON OF ARTERIOVENOUS FISTULA
Anesthesia: General | Site: Arm Upper | Laterality: Left

## 2016-06-19 MED ORDER — FENTANYL CITRATE (PF) 100 MCG/2ML IJ SOLN
INTRAMUSCULAR | Status: DC | PRN
Start: 2016-06-19 — End: 2016-06-19
  Administered 2016-06-19: 50 ug via INTRAVENOUS
  Administered 2016-06-19 (×6): 25 ug via INTRAVENOUS
  Administered 2016-06-19: 50 ug via INTRAVENOUS
  Administered 2016-06-19 (×2): 25 ug via INTRAVENOUS

## 2016-06-19 MED ORDER — HYDROMORPHONE HCL 1 MG/ML IJ SOLN
INTRAMUSCULAR | Status: AC
  Filled 2016-06-19: qty 0.5

## 2016-06-19 MED ORDER — FENTANYL CITRATE (PF) 250 MCG/5ML IJ SOLN
INTRAMUSCULAR | Status: AC
  Filled 2016-06-19: qty 5

## 2016-06-19 MED ORDER — SUGAMMADEX SODIUM 200 MG/2ML IV SOLN
INTRAVENOUS | Status: AC
  Filled 2016-06-19: qty 2

## 2016-06-19 MED ORDER — PROPOFOL 10 MG/ML IV BOLUS
INTRAVENOUS | Status: AC
  Filled 2016-06-19: qty 20

## 2016-06-19 MED ORDER — MIDAZOLAM HCL 5 MG/5ML IJ SOLN
INTRAMUSCULAR | Status: DC | PRN
  Administered 2016-06-19: 2 mg via INTRAVENOUS

## 2016-06-19 MED ORDER — OXYCODONE HCL 5 MG PO TABS
ORAL_TABLET | ORAL | Status: AC
  Filled 2016-06-19: qty 1

## 2016-06-19 MED ORDER — OXYCODONE HCL 5 MG PO TABS: 5.0000 mg | ORAL_TABLET | Freq: Four times a day (QID) | ORAL | 0 refills | Status: DC | PRN

## 2016-06-19 MED ORDER — PROTAMINE SULFATE 10 MG/ML IV SOLN
INTRAVENOUS | Status: AC
  Filled 2016-06-19: qty 10

## 2016-06-19 MED ORDER — PROTAMINE SULFATE 10 MG/ML IV SOLN
INTRAVENOUS | Status: DC | PRN
  Administered 2016-06-19: 40 mg via INTRAVENOUS
  Administered 2016-06-19: 20 mg via INTRAVENOUS

## 2016-06-19 MED ORDER — HEPARIN SODIUM (PORCINE) 1000 UNIT/ML IJ SOLN
INTRAMUSCULAR | Status: DC | PRN
  Administered 2016-06-19: 8000 [IU] via INTRAVENOUS

## 2016-06-19 MED ORDER — EPHEDRINE SULFATE 50 MG/ML IJ SOLN
INTRAMUSCULAR | Status: DC | PRN
  Administered 2016-06-19 (×4): 10 mg via INTRAVENOUS
  Administered 2016-06-19: 5 mg via INTRAVENOUS

## 2016-06-19 MED ORDER — MEPERIDINE HCL 25 MG/ML IJ SOLN: 6.2500 mg | INTRAMUSCULAR | Status: DC | PRN

## 2016-06-19 MED ORDER — PROPOFOL 10 MG/ML IV BOLUS
INTRAVENOUS | Status: DC | PRN
  Administered 2016-06-19: 30 mg via INTRAVENOUS
  Administered 2016-06-19: 150 mg via INTRAVENOUS
  Administered 2016-06-19: 50 mg via INTRAVENOUS

## 2016-06-19 MED ORDER — PROTAMINE SULFATE 10 MG/ML IV SOLN
INTRAVENOUS | Status: AC
  Filled 2016-06-19: qty 5

## 2016-06-19 MED ORDER — SODIUM CHLORIDE 0.9 % IR SOLN
Status: DC | PRN
  Administered 2016-06-19: 1000 mL

## 2016-06-19 MED ORDER — ROCURONIUM BROMIDE 10 MG/ML (PF) SYRINGE
PREFILLED_SYRINGE | INTRAVENOUS | Status: AC
  Filled 2016-06-19: qty 5

## 2016-06-19 MED ORDER — SODIUM CHLORIDE 0.9 % IV SOLN
INTRAVENOUS | Status: DC
  Administered 2016-06-19 (×2): via INTRAVENOUS

## 2016-06-19 MED ORDER — MIDAZOLAM HCL 2 MG/2ML IJ SOLN
INTRAMUSCULAR | Status: AC
  Filled 2016-06-19: qty 2

## 2016-06-19 MED ORDER — THROMBIN 20000 UNITS EX SOLR
CUTANEOUS | Status: AC
  Filled 2016-06-19: qty 20000

## 2016-06-19 MED ORDER — LIDOCAINE HCL (PF) 1 % IJ SOLN
INTRAMUSCULAR | Status: AC
  Filled 2016-06-19: qty 30

## 2016-06-19 MED ORDER — HEPARIN SODIUM (PORCINE) 5000 UNIT/ML IJ SOLN
INTRAMUSCULAR | Status: DC | PRN
  Administered 2016-06-19: 11:00:00

## 2016-06-19 MED ORDER — OXYCODONE HCL 5 MG PO TABS
5.0000 mg | ORAL_TABLET | Freq: Once | ORAL | Status: AC
  Administered 2016-06-19: 5 mg via ORAL

## 2016-06-19 MED ORDER — DEXTROSE 5 % IV SOLN
1.5000 g | INTRAVENOUS | Status: AC
  Administered 2016-06-19: 1.5 g via INTRAVENOUS
  Filled 2016-06-19: qty 1.5

## 2016-06-19 MED ORDER — HYDROMORPHONE HCL 1 MG/ML IJ SOLN
0.2500 mg | INTRAMUSCULAR | Status: DC | PRN
  Administered 2016-06-19 (×2): 0.5 mg via INTRAVENOUS

## 2016-06-19 MED ORDER — LIDOCAINE 2% (20 MG/ML) 5 ML SYRINGE
INTRAMUSCULAR | Status: AC
  Filled 2016-06-19: qty 5

## 2016-06-19 MED ORDER — LIDOCAINE HCL (CARDIAC) 20 MG/ML IV SOLN
INTRAVENOUS | Status: DC | PRN
  Administered 2016-06-19: 100 mg via INTRAVENOUS

## 2016-06-19 MED ORDER — SODIUM CHLORIDE 0.9 % IV SOLN
INTRAVENOUS | Status: DC | PRN
  Administered 2016-06-19: 25 ug/min via INTRAVENOUS

## 2016-06-19 MED ORDER — ONDANSETRON HCL 4 MG/2ML IJ SOLN
INTRAMUSCULAR | Status: DC | PRN
  Administered 2016-06-19: 4 mg via INTRAVENOUS

## 2016-06-19 MED ORDER — SODIUM CHLORIDE 0.9 % IV SOLN
INTRAVENOUS | Status: DC
  Administered 2016-06-19: 09:00:00 via INTRAVENOUS

## 2016-06-19 MED ORDER — GELATIN ABSORBABLE MT POWD
OROMUCOSAL | Status: DC | PRN
  Administered 2016-06-19: 13:00:00 via TOPICAL

## 2016-06-19 MED ORDER — ONDANSETRON HCL 4 MG/2ML IJ SOLN: 4.0000 mg | Freq: Once | INTRAMUSCULAR | Status: DC | PRN

## 2016-06-19 SURGICAL SUPPLY — 40 items
ARMBAND PINK RESTRICT EXTREMIT (MISCELLANEOUS) ×2 IMPLANT
BANDAGE ELASTIC 4 VELCRO ST LF (GAUZE/BANDAGES/DRESSINGS) ×2 IMPLANT
CANISTER SUCT 3000ML PPV (MISCELLANEOUS) ×2 IMPLANT
CANNULA VESSEL 3MM 2 BLNT TIP (CANNULA) ×2 IMPLANT
CLIP TI MEDIUM 6 (CLIP) ×2 IMPLANT
CLIP TI WIDE RED SMALL 6 (CLIP) ×4 IMPLANT
COVER PROBE W GEL 5X96 (DRAPES) ×2 IMPLANT
DERMABOND ADVANCED (GAUZE/BANDAGES/DRESSINGS) ×1
DERMABOND ADVANCED .7 DNX12 (GAUZE/BANDAGES/DRESSINGS) ×1 IMPLANT
ELECT REM PT RETURN 9FT ADLT (ELECTROSURGICAL) ×2
ELECTRODE REM PT RTRN 9FT ADLT (ELECTROSURGICAL) ×1 IMPLANT
GAUZE SPONGE 4X4 12PLY STRL LF (GAUZE/BANDAGES/DRESSINGS) ×2 IMPLANT
GAUZE SPONGE 4X4 16PLY XRAY LF (GAUZE/BANDAGES/DRESSINGS) ×6 IMPLANT
GLOVE BIO SURGEON STRL SZ 6.5 (GLOVE) ×4 IMPLANT
GLOVE BIO SURGEON STRL SZ7.5 (GLOVE) ×2 IMPLANT
GLOVE BIOGEL PI IND STRL 6.5 (GLOVE) ×4 IMPLANT
GLOVE BIOGEL PI IND STRL 7.0 (GLOVE) ×1 IMPLANT
GLOVE BIOGEL PI IND STRL 8 (GLOVE) ×1 IMPLANT
GLOVE BIOGEL PI INDICATOR 6.5 (GLOVE) ×4
GLOVE BIOGEL PI INDICATOR 7.0 (GLOVE) ×1
GLOVE BIOGEL PI INDICATOR 8 (GLOVE) ×1
GLOVE ECLIPSE 6.5 STRL STRAW (GLOVE) ×4 IMPLANT
GLOVE SURG SS PI 6.0 STRL IVOR (GLOVE) ×2 IMPLANT
GOWN STRL REUS W/ TWL LRG LVL3 (GOWN DISPOSABLE) ×5 IMPLANT
GOWN STRL REUS W/TWL LRG LVL3 (GOWN DISPOSABLE) ×5
KIT BASIN OR (CUSTOM PROCEDURE TRAY) ×2 IMPLANT
KIT ROOM TURNOVER OR (KITS) ×2 IMPLANT
NS IRRIG 1000ML POUR BTL (IV SOLUTION) ×2 IMPLANT
PACK CV ACCESS (CUSTOM PROCEDURE TRAY) ×2 IMPLANT
PAD ARMBOARD 7.5X6 YLW CONV (MISCELLANEOUS) ×4 IMPLANT
PATCH VASC XENOSURE 1CMX6CM (Vascular Products) ×1 IMPLANT
PATCH VASC XENOSURE 1X6 (Vascular Products) ×1 IMPLANT
SPONGE SURGIFOAM ABS GEL 100 (HEMOSTASIS) ×2 IMPLANT
SUT PROLENE 6 0 BV (SUTURE) ×6 IMPLANT
SUT SILK 2 0 FS (SUTURE) ×2 IMPLANT
SUT VIC AB 3-0 SH 27 (SUTURE) ×2
SUT VIC AB 3-0 SH 27X BRD (SUTURE) ×2 IMPLANT
SUT VICRYL 4-0 PS2 18IN ABS (SUTURE) ×4 IMPLANT
UNDERPAD 30X30 (UNDERPADS AND DIAPERS) ×2 IMPLANT
WATER STERILE IRR 1000ML POUR (IV SOLUTION) ×2 IMPLANT

## 2016-06-19 NOTE — Anesthesia Procedure Notes (Signed)
Procedure Name: LMA Insertion Date/Time: 06/19/2016 10:39 AM Performed by: Lance Coon Pre-anesthesia Checklist: Patient identified, Emergency Drugs available, Suction available, Patient being monitored and Timeout performed Patient Re-evaluated:Patient Re-evaluated prior to inductionOxygen Delivery Method: Circle system utilized Preoxygenation: Pre-oxygenation with 100% oxygen Intubation Type: IV induction LMA: LMA inserted LMA Size: 4.0 Number of attempts: 1 Placement Confirmation: positive ETCO2 and breath sounds checked- equal and bilateral Tube secured with: Tape Dental Injury: Teeth and Oropharynx as per pre-operative assessment  Comments: Airway per Chrissie Noa

## 2016-06-19 NOTE — Anesthesia Preprocedure Evaluation (Addendum)
Anesthesia Evaluation  Patient identified by MRN, date of birth, ID band Patient awake    Reviewed: Allergy & Precautions, NPO status , Patient's Chart, lab work & pertinent test results  Airway Mallampati: I  TM Distance: >3 FB Neck ROM: Full    Dental   Pulmonary    Pulmonary exam normal        Cardiovascular hypertension, Pt. on medications Normal cardiovascular exam     Neuro/Psych    GI/Hepatic GERD  Medicated and Controlled,  Endo/Other  diabetes  Renal/GU ESRF and DialysisRenal disease     Musculoskeletal   Abdominal   Peds  Hematology   Anesthesia Other Findings   Reproductive/Obstetrics                             Anesthesia Physical Anesthesia Plan  ASA: III  Anesthesia Plan: General   Post-op Pain Management:    Induction: Intravenous  PONV Risk Score and Plan: 2 and Ondansetron, Dexamethasone and Treatment may vary due to age or medical condition  Airway Management Planned: LMA  Additional Equipment:   Intra-op Plan:   Post-operative Plan: Extubation in OR  Informed Consent: I have reviewed the patients History and Physical, chart, labs and discussed the procedure including the risks, benefits and alternatives for the proposed anesthesia with the patient or authorized representative who has indicated his/her understanding and acceptance.     Plan Discussed with: CRNA and Surgeon  Anesthesia Plan Comments:        Anesthesia Quick Evaluation

## 2016-06-19 NOTE — Transfer of Care (Signed)
Immediate Anesthesia Transfer of Care Note  Patient: Patricia [name]  Procedure(s) Performed: Procedure(s): REVISION SUPERFICIALIZATION OF BRACHIOCEPHALIC ARTERIOVENOUS FISTULA (Left) PATCH ANGIOPLASTY LEFT ARTERIOVENOUS FISTULA (Left)  Patient Location: PACU  Anesthesia Type:General  Level of Consciousness: awake, alert , oriented and patient cooperative  Airway & Oxygen Therapy: Patient Spontanous Breathing  Post-op Assessment: Report given to RN and Post -op Vital signs reviewed and stable  Post vital signs: Reviewed and stable  Last Vitals:  Vitals:   06/19/16 0853 06/19/16 1307  BP: (!) (P) 155/71   Pulse: (P) 60   Resp: (P) 16   Temp: (P) 36.7 C (P) 37.1 C    Last Pain:  Vitals:   06/19/16 0853  TempSrc: (P) Oral         Complications: No apparent anesthesia complications

## 2016-06-19 NOTE — H&P (Signed)
POST OPERATIVE OFFICE NOTE    CC:  F/u for surgery  HPI:  This is a 41 y.o. female who is s/p ligation of left RC AVF and creation of left brachiocephalic AVF on 0/96/04 by Dr. Scot Dock.  She previously had right TDC in the right IJ on 04/07/16 by Dr. Donzetta Matters.    She states she has done well since surgery.  She denies any pain or numbness in her left hand.    She dialyzes T/T/S at the [facility] location in [place].  She states that on Monday, she had about an hour and a half episode of increased heart rate.  She stated that she started to feel bad and checked her rate and it was 167.  EMS was called and at one point, she states that her heart rate was as high as 190.  She did have some chest pressure with this event.  It had never happened before this.  She states that after the IV was started and they were about to give her medicine, she converted.  She states she has not had any further sx since then.  She was instructed to call the cardiologist and make an appointment.  She has not yet called to make appointment.         Allergies  Allergen Reactions  . Nsaids Other (See Comments)    Cannot take due to Kidney disease  . Tolmetin Other (See Comments)    Cannot take due to Kidney Disease          Current Outpatient Prescriptions  Medication Sig Dispense Refill  . allopurinol (ZYLOPRIM) 100 MG tablet Take 100 mg by mouth daily.    Marland Kitchen amLODipine (NORVASC) 10 MG tablet Take 1 tablet (10 mg total) by mouth daily. (Patient not taking: Reported on 05/11/2016) 30 tablet 0  . aspirin 81 MG chewable tablet Chew 1 tablet (81 mg total) by mouth daily.    Marland Kitchen atorvastatin (LIPITOR) 40 MG tablet Take 40 mg by mouth daily.  3  . calcium acetate (PHOSLO) 667 MG capsule Take 1,334 mg by mouth 3 (three) times daily with meals.     . diphenhydramine-acetaminophen (TYLENOL PM) 25-500 MG TABS tablet Take 2 tablets by mouth at bedtime as needed (for sleep).     Marland Kitchen lisinopril  (PRINIVIL,ZESTRIL) 10 MG tablet Take 10 mg by mouth daily.    Marland Kitchen LORazepam (ATIVAN) 0.5 MG tablet Take 0.5 mg by mouth every 8 (eight) hours as needed for anxiety.     . magnesium oxide (MAG-OX) 400 MG tablet Take 1 tablet (400 mg total) by mouth daily. (Patient not taking: Reported on 05/11/2016) 60 tablet 0  . metoprolol tartrate (LOPRESSOR) 25 MG tablet Take 25 mg by mouth daily.    . multivitamin (RENA-VIT) TABS tablet Take 1 tablet by mouth daily.    Marland Kitchen omeprazole (PRILOSEC) 40 MG capsule Take 40 mg by mouth daily.      . promethazine (PHENERGAN) 25 MG tablet Take 1 tablet (25 mg total) by mouth every 6 (six) hours as needed for nausea or vomiting. (Patient not taking: Reported on 05/11/2016) 10 tablet 0  . senna (SENOKOT) 8.6 MG TABS tablet Take 2 tablets (17.2 mg total) by mouth at bedtime. 120 each 0  . sertraline (ZOLOFT) 50 MG tablet Take 50 mg by mouth daily.    . temazepam (RESTORIL) 15 MG capsule Take 15 mg by mouth at bedtime as needed for sleep.     No current facility-administered medications for  this visit.      ROS:  See HPI  Physical Exam:  Vitals:   05/13/16 1435  BP: 102/68  Pulse: 84  Resp: 16  Temp: 97.7 F (36.5 C)      Vitals:   05/13/16 1435  Weight: 229 lb (103.9 kg)  Height: $Remove'5\' 7"'ColimVq$  (1.702 m)     Incision:  Well healed Extremities:  +thrill/bruit within the fistula; 2+ left radial pulse; strong grip with left hand.    Left BC AVF duplex 05/13/16: Diameter:  0.62cm-1.19cm Depth:  0.28cm-1.66cm   Assessment/Plan:  This is a 41 y.o. female who is s/p: ligation of left RC AVF and creation of left brachiocephalic AVF on 0/22/17 by Dr. Scot Dock  -pt's fistula has matured nicely, but is too deep and will need superficialization.   -pt had episode of SVT on Monday 05/11/16 and was referred to cardiology.  Will schedule superficialization by Dr. Scot Dock for 3-4 weeks, which will hopefully give her time to get seen by Cardiology prior  to her procedure.   -she understands that after her fistula is used 2-3 times without difficulty, it can be removed. -she dialyzes T/T/S at the [facility]    Leontine Locket, PA-C [facility] [phone]  Clinic MD:  Donzetta Matters

## 2016-06-19 NOTE — Anesthesia Postprocedure Evaluation (Signed)
Anesthesia Post Note  Patient: Patricia [name]  Procedure(s) Performed: Procedure(s) (LRB): REVISION SUPERFICIALIZATION OF BRACHIOCEPHALIC ARTERIOVENOUS FISTULA (Left) PATCH ANGIOPLASTY LEFT ARTERIOVENOUS FISTULA (Left)     Patient location during evaluation: PACU Anesthesia Type: General Level of consciousness: awake and alert Pain management: pain level controlled Vital Signs Assessment: post-procedure vital signs reviewed and stable Respiratory status: spontaneous breathing, nonlabored ventilation, respiratory function stable and patient connected to nasal cannula oxygen Cardiovascular status: blood pressure returned to baseline and stable Postop Assessment: no signs of nausea or vomiting Anesthetic complications: no    Last Vitals:  Vitals:   06/19/16 1439 06/19/16 1454  BP: 124/72 114/69  Pulse: 75 80  Resp: 18 17  Temp:      Last Pain:  Vitals:   06/19/16 1448  TempSrc:   PainSc: 3                  Thadius Smisek DAVID

## 2016-06-19 NOTE — Discharge Instructions (Signed)
° ° °  06/19/2016 Patricia [name] [id number] [birth year]  Surgeon(s): Angelia Mould, MD  Procedure(s): REVISION SUPERFICIALIZATION OF BRACHIOCEPHALIC ARTERIOVENOUS FISTULA PATCH ANGIOPLASTY LEFT ARTERIOVENOUS FISTULA  x May stick graft on designated area only:  Do NOT stick over distal incision EVER.  May stick above this in 6 weeks. SEE DIAGRAM!

## 2016-06-19 NOTE — Op Note (Signed)
    NAME: Patricia [name]    MRN: [id number] DOB: 02-27-1975    DATE OF OPERATION: 06/19/2016  PREOP DIAGNOSIS:    END-STAGE RENAL DISEASE  POSTOP DIAGNOSIS:    Same  PROCEDURE:    SUPERFICIALIZATION OF LEFT BRACHIOCEPHALIC AV FISTULA  REVISION OF LEFT BRACHIOCEPHALIC AV FISTULA and BOVINE PERICARDIAL PATCH ANGIOPLASTY  SURGEON: Judeth Cornfield. Scot Dock, MD, FACS  ASSIST: Leontine Locket, PA  ANESTHESIA: Gen.   EBL: Minimal  INDICATIONS:    Patricia [name] is a 41 y.o. female who had a left brachiocephalic AV fistula that was maturing adequately however was quite deep and therefore she presents for superficialIZATION  FINDINGS:    There was marked intimal hyperplasia at the proximal segment of the fistula. Therefore a segment was excised and then bovine pericardial patch angioplasty was performed.  TECHNIQUE:    The patient was taken to the operating room and received a general anesthetic. The left upper extremity was prepped and draped in usual sterile fashion. Using 3 incisions overlying the fistula the fistula was dissected free from the antecubital level up to the shoulder. 2 branches were divided between clips and 3-0 silk ties. A large amount of adipose tissue was debrided from the superficial segment above the fistula. The proximal fistula was pulsatile and there was an area of marked intimal hyperplasia. I felt that this would also have to be addressed. The patient was heparinized. The fistula was clamped proximally and distally. It was opened longitudinally and it was a segment of intimal hyperplasia. There was enough redundancy that I was able to excise this area with intimal hyperplasia. The vein was then sewn back in the end with continuous 6-0 Prolene suture. I then closed the anterior wall with a bovine pericardial patch. This was sewn with running 6-0 Prolene suture. At the completion was an excellent thrill in the fistula. Hemostasis was obtained in the wound. Each of  the wounds was closed with 4-0 subcuticular stitch. Sterile dressing was applied. The patient tolerated the procedure well and was transferred to the recovery room in stable condition. All needle and sponge counts were correct.  [name], MD, FACS [facility]  DATE OF DICTATION:   06/19/2016

## 2016-06-19 NOTE — Telephone Encounter (Signed)
-----   Message from Mena Goes, RN sent at 06/19/2016  2:20 PM EDT ----- Regarding: 4-5 weeks    ----- Message ----- From: Angelia Mould, MD Sent: 06/19/2016  12:57 PM To: [facility] Charge Pool Subject: CHARGE                                          PROCEDURE:   SUPERFICIALIZATION OF LEFT BRACHIOCEPHALIC AV FISTULA  REVISION OF LEFT BRACHIOCEPHALIC AV FISTULA and BOVINE PERICARDIAL PATCH ANGIOPLASTY  SURGEON: Judeth Cornfield. Scot Dock, MD, FACS  ASSIST: Leontine Locket, PA  She will need a follow up visit in a proximally 4 weeks with a duplex or fistula at that time. Thank you.

## 2016-06-19 NOTE — Telephone Encounter (Signed)
Sched lab 07/22/16 at 4:00 and MD 07/29/16 at 1:00. Spoke to pt's daughter to confirm appts.

## 2016-06-22 ENCOUNTER — Encounter (HOSPITAL_COMMUNITY): Payer: Self-pay | Admitting: Vascular Surgery

## 2016-06-27 IMAGING — CT CT HEAD W/O CM
2 series · 15 of 30 positions shown, 17 images · non-contrast
Comparison: None.

CLINICAL DATA: Code stroke. Expressive aphasia. Trembling. Initial
encounter.

EXAM:
CT HEAD WITHOUT CONTRAST
TECHNIQUE: Contiguous axial images were obtained from the base of the skull
through the vertex without intravenous contrast.

[Series 2: head without · axial · non-contrast · 0.39mm/px · z∈[+162,+288]mm · 7 of 35 slices shown, 9 images]
[im 5/35  brain]
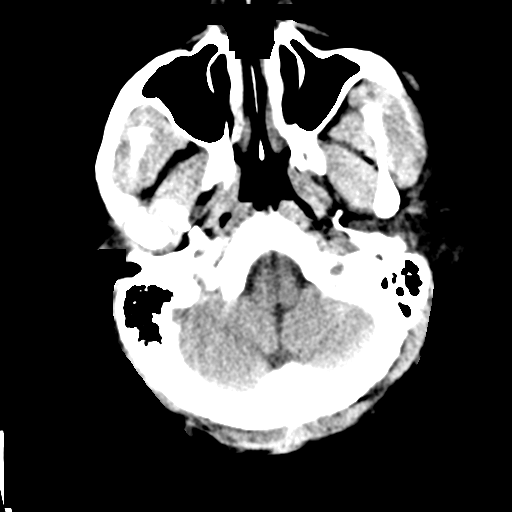
[im 5/35  bone]
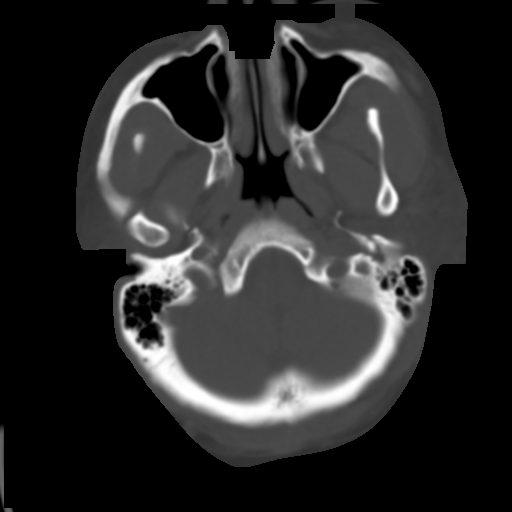
[im 9/35  brain]
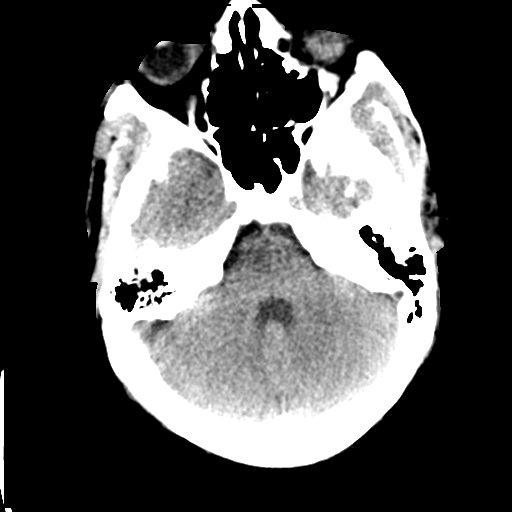
[im 13/35  brain]
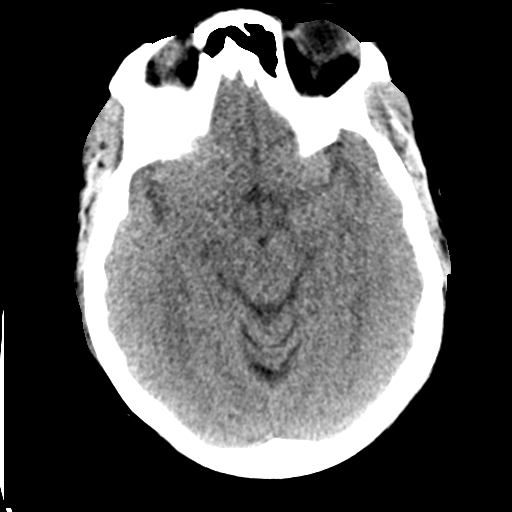
[im 18/35  brain]
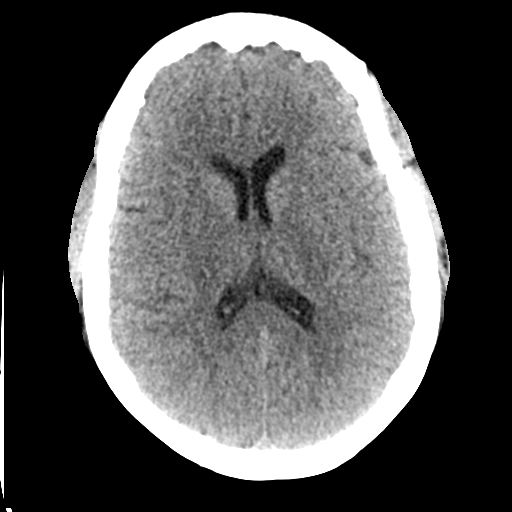
[im 22/35  brain]
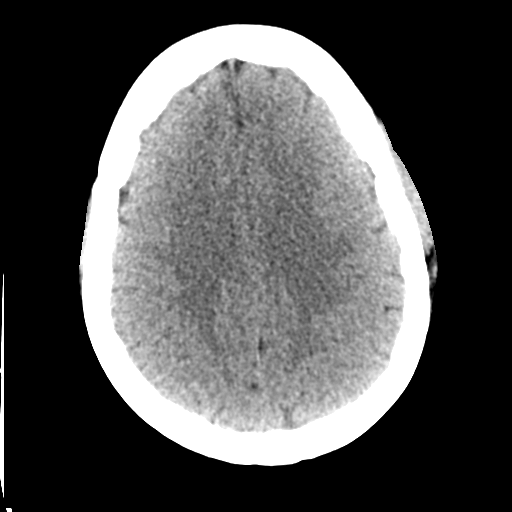
[im 22/35  bone]
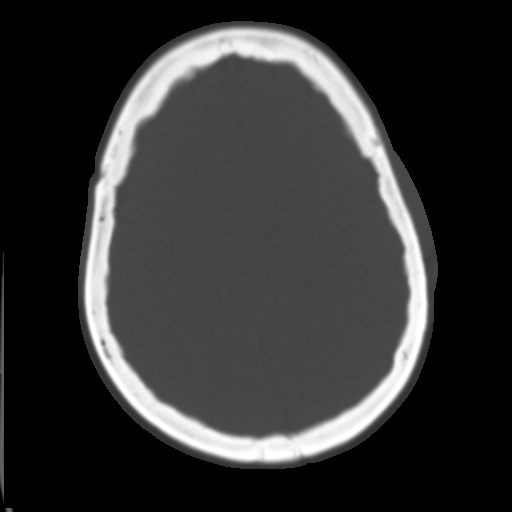
[im 26/35  brain]
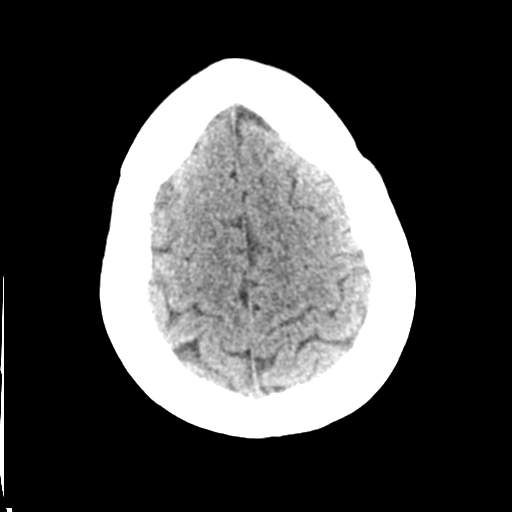
[im 30/35  brain]
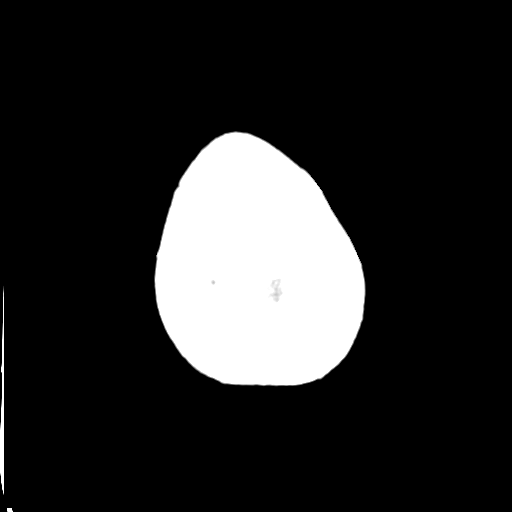

[Series 3: head bone · axial · 0.39mm/px · z∈[+158,+298]mm · 8 of 88 slices shown]
[im 9/88  bone]
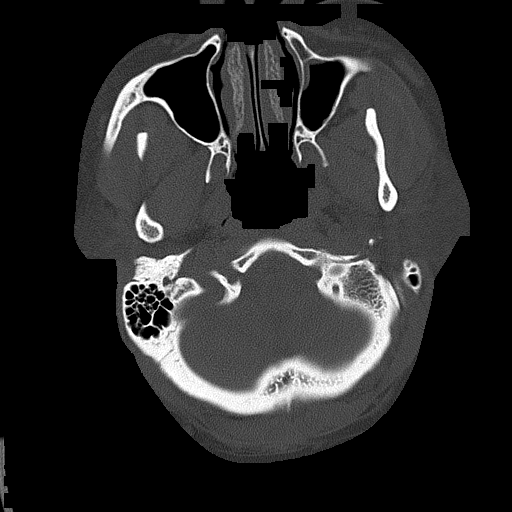
[im 18/88  bone]
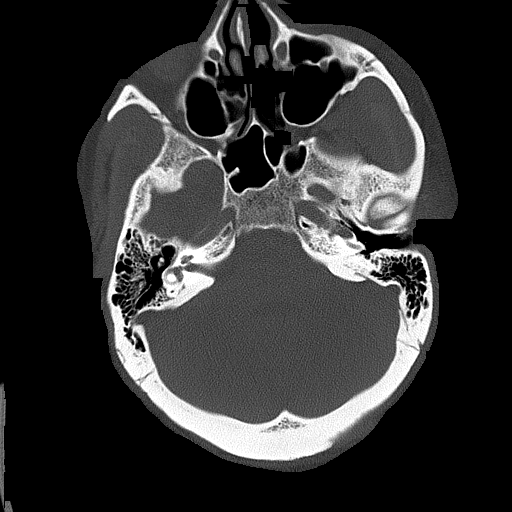
[im 27/88  bone]
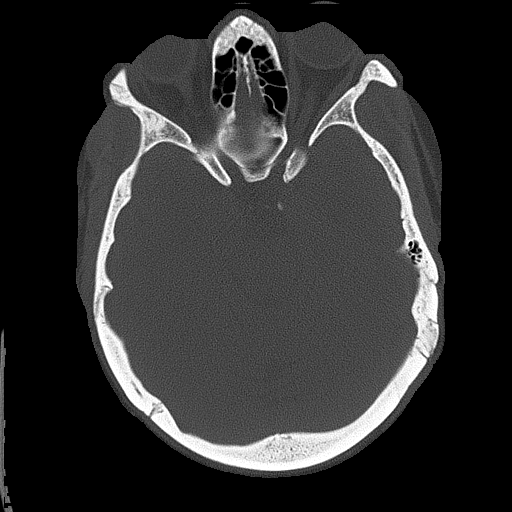
[im 40/88  bone]
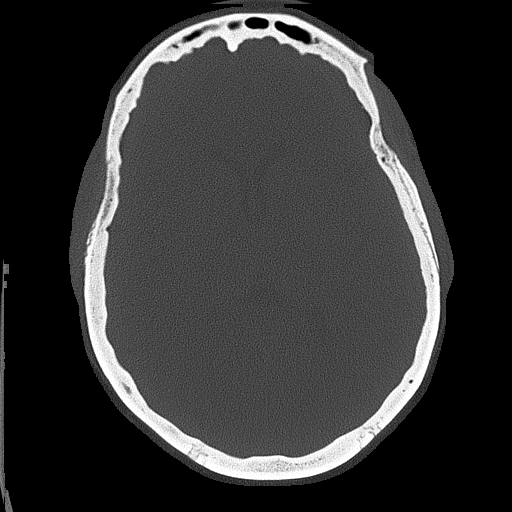
[im 48/88  bone]
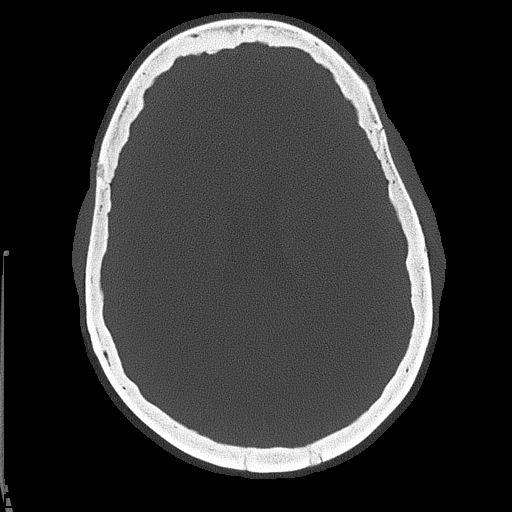
[im 61/88  bone]
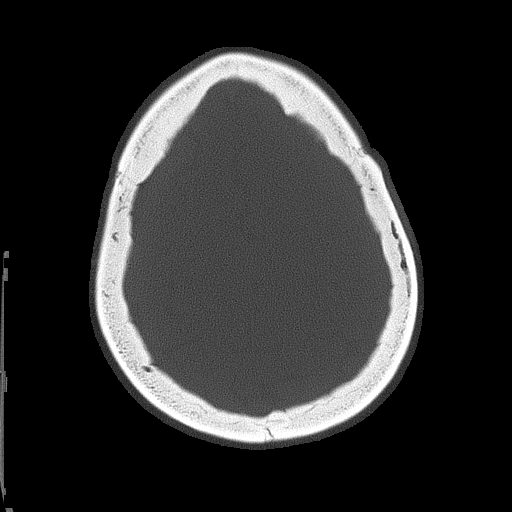
[im 70/88  bone]
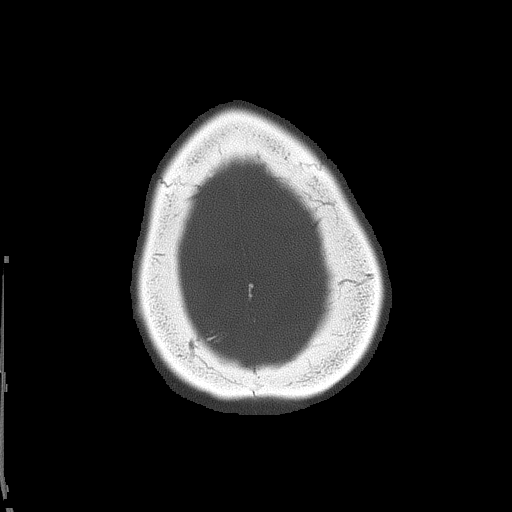
[im 79/88  bone]
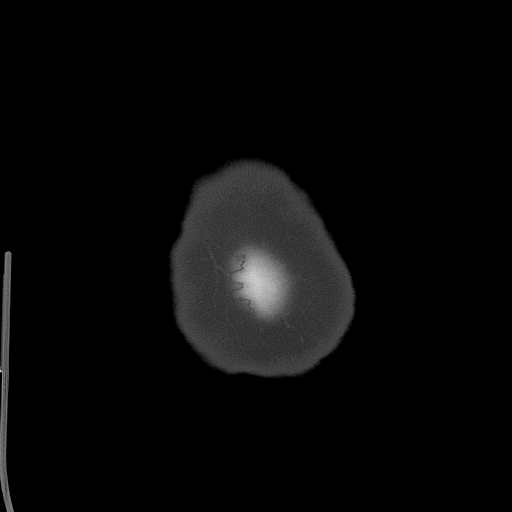

[15 of 30 positions shown; findings below may reference images not displayed]

FINDINGS: There is no evidence of acute infarction, mass lesion, or intra- or
extra-axial hemorrhage on CT.

The posterior fossa, including the cerebellum, brainstem and fourth
ventricle, is within normal limits. The third and lateral
ventricles, and basal ganglia are unremarkable in appearance. The
cerebral hemispheres are symmetric in appearance, with normal
gray-white differentiation. No mass effect or midline shift is seen.

There is no evidence of fracture; visualized osseous structures are
unremarkable in appearance. The orbits are within normal limits. The
paranasal sinuses and mastoid air cells are well-aerated. No
significant soft tissue abnormalities are seen.
IMPRESSION: Unremarkable noncontrast CT of the head.

These results were called by telephone at the time of interpretation
on 01/18/2015 at [DATE] to Dr. Qemail Sarajlic, who verbally
acknowledged these results.

## 2016-07-15 ENCOUNTER — Encounter: Payer: Self-pay | Admitting: Vascular Surgery

## 2016-07-16 ENCOUNTER — Other Ambulatory Visit: Payer: Self-pay

## 2016-07-16 DIAGNOSIS — Z992 Dependence on renal dialysis: Secondary | ICD-10-CM

## 2016-07-16 DIAGNOSIS — N186 End stage renal disease: Secondary | ICD-10-CM

## 2016-07-22 ENCOUNTER — Ambulatory Visit (HOSPITAL_COMMUNITY)
Admit: 2016-07-22 | Discharge: 2016-07-22 | Disposition: A | Discharge status: Home / Self Care | Payer: Medicare Other | Source: Ambulatory Visit | Attending: Vascular Surgery | Admitting: Vascular Surgery

## 2016-07-22 DIAGNOSIS — N186 End stage renal disease: Secondary | ICD-10-CM | POA: Diagnosis present

## 2016-07-22 DIAGNOSIS — Z992 Dependence on renal dialysis: Secondary | ICD-10-CM | POA: Diagnosis not present

## 2016-07-29 ENCOUNTER — Encounter: Payer: Medicare Other | Admitting: Vascular Surgery

## 2016-08-18 ENCOUNTER — Encounter: Payer: Self-pay | Admitting: Vascular Surgery

## 2016-08-27 ENCOUNTER — Encounter: Payer: Self-pay | Admitting: Vascular Surgery

## 2016-08-27 ENCOUNTER — Ambulatory Visit (INDEPENDENT_AMBULATORY_CARE_PROVIDER_SITE_OTHER): Payer: Self-pay | Admitting: Vascular Surgery

## 2016-08-27 ENCOUNTER — Encounter: Payer: Medicare Other | Admitting: Vascular Surgery

## 2016-08-27 VITALS — BP 93/69 | HR 82 | Temp 97.7°F | Resp 18 | Ht 67.0 in | Wt 238.0 lb

## 2016-08-27 DIAGNOSIS — Z48812 Encounter for surgical aftercare following surgery on the circulatory system: Secondary | ICD-10-CM

## 2016-08-27 NOTE — Progress Notes (Signed)
[name] MRN: [id number] DOB: 1975/05/04 Sex: female  REASON FOR VISIT:    Follow up  HPI:   Patricia [name] is a pleasant 41 y.o. female who had a left brachiocephalic AV fistula that was maturing adequately however was quite deep and therefore she underwent superficialization and revision of her left brachiocephalic fistula on 06/19/2016. She had an area of significant intimal hyperplasia at the proximal aspect of the fistula. This was excised and she had bovine pericardial patch angioplasty in addition to her superficialization.  She has no specific complaints. She continues to use her tunneled dialysis catheter for dialysis.  Current Outpatient Prescriptions  Medication Sig Dispense Refill  . allopurinol (ZYLOPRIM) 100 MG tablet Take 100 mg by mouth daily.    Marland Kitchen ALPRAZolam (XANAX) 0.5 MG tablet Take 1 tablet by mouth 2 (two) times daily as needed for anxiety.     Marland Kitchen aspirin 81 MG chewable tablet Chew 1 tablet (81 mg total) by mouth daily.    Marland Kitchen atorvastatin (LIPITOR) 40 MG tablet Take 40 mg by mouth daily.  3  . calcium acetate (PHOSLO) 667 MG capsule Take 1,334 mg by mouth 3 (three) times daily with meals.     . diphenhydramine-acetaminophen (TYLENOL PM) 25-500 MG TABS tablet Take 2 tablets by mouth at bedtime as needed (for sleep).     . metoprolol tartrate (LOPRESSOR) 25 MG tablet Take 12.5-25 mg by mouth See admin instructions. 12.5 mg on Sun Mon Wed Fri in the mornings ( Days not at Dialysis) 25 mg every evening (Sun - Sat)    . multivitamin (RENA-VIT) TABS tablet Take 1 tablet by mouth daily.    Marland Kitchen omeprazole (PRILOSEC) 40 MG capsule Take 1 capsule by mouth daily.    Marland Kitchen senna (SENOKOT) 8.6 MG TABS tablet Take 2 tablets (17.2 mg total) by mouth at bedtime. 120 each 0  . sertraline (ZOLOFT) 50 MG tablet Take 50 mg by mouth daily.    Marland Kitchen LORazepam (ATIVAN) 0.5 MG tablet Take 0.5 mg by mouth every 8 (eight) hours as needed for anxiety.     Marland Kitchen oxyCODONE (ROXICODONE) 5 MG  immediate release tablet Take 1 tablet (5 mg total) by mouth every 6 (six) hours as needed. (Patient not taking: Reported on 08/27/2016) 10 tablet 0  . temazepam (RESTORIL) 15 MG capsule Take 1 capsule by mouth at bedtime as needed for sleep.      No current facility-administered medications for this visit.     REVIEW OF SYSTEMS:  [X]  denotes positive finding, [ ]  denotes negative finding Cardiac  Comments:  Chest pain or chest pressure:    Shortness of breath upon exertion:    Short of breath when lying flat:    Irregular heart rhythm:    Constitutional    Fever or chills:     PHYSICAL EXAM:   Vitals:   08/27/16 1313  BP: 93/69  Pulse: 82  Resp: 18  Temp: 97.7 F (36.5 C)  TempSrc: Oral  SpO2: 99%  Weight: 238 lb (108 kg)  Height: 5\' 7"  (1.702 m)    GENERAL: The patient is a well-nourished female, in no acute distress. The vital signs are documented above. CARDIOVASCULAR: There is a regular rate and rhythm. PULMONARY: There is good air exchange bilaterally without wheezing or rales. Her upper arm fistula has an excellent thrill. She has a palpable left radial pulse.   DATA:   Duplex of her fistula postoperatively on 07/22/2016 showed that the diameters of the  fistula ranged from 0.65-0.74 cm. Dapsone appeared reasonable.  MEDICAL ISSUES:   STATUS POST REVISION OF LEFT BRACHIOCEPHALIC AV FISTULA:  The fistula appears to be maturing nicely and should be much easier to cannulate. I think it would be safe to use the fistula in 4-6 weeks. I will see her back as needed.   [name] [facility] [place] [phone]

## 2016-08-28 ENCOUNTER — Ambulatory Visit (INDEPENDENT_AMBULATORY_CARE_PROVIDER_SITE_OTHER): Payer: Medicare Other | Admitting: Cardiovascular Disease

## 2016-08-28 ENCOUNTER — Encounter: Payer: Self-pay | Admitting: Cardiovascular Disease

## 2016-08-28 VITALS — BP 110/80 | HR 72 | Ht 67.0 in | Wt 239.0 lb

## 2016-08-28 DIAGNOSIS — E7800 Pure hypercholesterolemia, unspecified: Secondary | ICD-10-CM

## 2016-08-28 DIAGNOSIS — N186 End stage renal disease: Secondary | ICD-10-CM

## 2016-08-28 DIAGNOSIS — I471 Supraventricular tachycardia: Secondary | ICD-10-CM

## 2016-08-28 DIAGNOSIS — I151 Hypertension secondary to other renal disorders: Secondary | ICD-10-CM | POA: Diagnosis not present

## 2016-08-28 DIAGNOSIS — N2889 Other specified disorders of kidney and ureter: Secondary | ICD-10-CM | POA: Diagnosis not present

## 2016-08-28 NOTE — Patient Instructions (Signed)
Dr Croitoru recommends that you schedule a follow-up appointment in 12 months. You will receive a reminder letter in the mail two months in advance. If you don't receive a letter, please call our office to schedule the follow-up appointment.  If you need a refill on your cardiac medications before your next appointment, please call your pharmacy. 

## 2016-08-28 NOTE — Progress Notes (Signed)
Cardiology Office Note:    Date:  08/28/2016   ID:  Patricia [name], DOB 04/22/1975, MRN [id number]  PCP:  Lindley Magnus, PA-C  Cardiologist:  Thurmon Fair, MD    Referring MD: Lindley Magnus, PA-C   Chief Complaint  Patient presents with  . Follow-up  SVT  History of Present Illness:    Patricia [name] is a 41 y.o. 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  She continues to have palpitations, but this is limited to isolated "skipped" beats. A 30 day event monitor showed frequent PACs but no sustained arrhythmia. She has had to adjust the dose of metoprolol based on whether or not she has a dialysis day due to problems with low blood pressure. She is also taking lisinopril 10 mg daily, since metoprolol alone apparently did not control the pressure sufficiently.  Her echocardiogram most recently performed in 2017 shows normal left ventricular size and function with mild LVH in the absence of any significant valvular abnormalities. Her left atrium was mildly dilated (ESD 43 mm).  Her daughter, who is an Psychologist, counselling, is undergoing the workup for live related donor kidney transplant. The patient had initial AV fistula that was too deep and had to be ligated. She has a new AV fistula in the left antecubital area that is almost mature. She is currently getting dialysis through a tunneled right subclavian catheter that was placed in April. 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  She denies syncope, angina, exertional dyspnea, edema, claudication or other focal neurological complaints.  Past Medical History:  Diagnosis Date  . Acid reflux   . Anxiety   . Chronic kidney  disease (CKD), stage III (moderate)   . Headache(784.0)    "related to chemo; sometimes weekly" (09/12/2013)  . High cholesterol   . History of blood transfusion "a couple"   "related to low counts"  . Hypertension   . Iron deficiency anemia    "get epogen shots q month" (02/20/2014)  . 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 Type II diabetes mellitus (HCC)    "took me off my RX ~ 04/2013"    Past Surgical History:  Procedure Laterality Date  . ABDOMINAL HYSTERECTOMY  2010   "laparoscopic"  . ANKLE FRACTURE SURGERY Right 1994  . AV FISTULA PLACEMENT Left 04/14/2016   Procedure: ARTERIOVENOUS (AV) FISTULA CREATION;  Surgeon: Chuck Hint, MD;  Location: [facility] OR;  Service: Vascular;  Laterality: Left;  . CARDIAC CATHETERIZATION  2000's  . FISTULOGRAM Left 04/07/2016   Procedure: FISTULOGRAM;  Surgeon: Maeola Harman, MD;  Location: [facility] OR;  Service: Vascular;  Laterality: Left;  . FRACTURE SURGERY    . INSERTION OF DIALYSIS CATHETER Right 04/07/2016   Procedure: INSERTION OF DIALYSIS CATHETER;  Surgeon: Maeola Harman, MD;  Location: [facility] OR;  Service: Vascular;  Laterality: Right;  . LIGATION OF ARTERIOVENOUS  FISTULA Left 04/14/2016   Procedure: LIGATION OF ARTERIOVENOUS  FISTULA;  Surgeon: Chuck Hint, MD;  Location: [facility] OR;  Service: Vascular;  Laterality: Left;  . PATCH ANGIOPLASTY Left 06/19/2016   Procedure: PATCH ANGIOPLASTY LEFT  ARTERIOVENOUS FISTULA;  Surgeon: Angelia Mould, MD;  Location: [place];  Service: Vascular;  Laterality: Left;  . REVISON OF ARTERIOVENOUS FISTULA Left 06/19/2016   Procedure: REVISION SUPERFICIALIZATION OF BRACHIOCEPHALIC ARTERIOVENOUS FISTULA;  Surgeon: Angelia Mould, MD;  Location: [facility] OR;  Service: Vascular;  Laterality: Left;    Current Medications: Current Meds  Medication Sig  . allopurinol (ZYLOPRIM) 100 MG tablet Take 100 mg by mouth daily.    Marland Kitchen ALPRAZolam (XANAX) 0.5 MG tablet Take 1 tablet by mouth 2 (two) times daily as needed for anxiety.   Marland Kitchen aspirin 81 MG chewable tablet Chew 1 tablet (81 mg total) by mouth daily.  Marland Kitchen atorvastatin (LIPITOR) 40 MG tablet Take 40 mg by mouth daily.  . calcium acetate (PHOSLO) 667 MG capsule Take 1,334 mg by mouth 3 (three) times daily with meals.   . diphenhydramine-acetaminophen (TYLENOL PM) 25-500 MG TABS tablet Take 2 tablets by mouth at bedtime as needed (for sleep).   . metoprolol tartrate (LOPRESSOR) 25 MG tablet Take 12.5-25 mg by mouth See admin instructions. 12.5 mg on Sun Mon Wed Fri in the mornings ( Days not at Dialysis) 25 mg every evening (Sun - Sat)  . multivitamin (RENA-VIT) TABS tablet Take 1 tablet by mouth daily.  Marland Kitchen omeprazole (PRILOSEC) 40 MG capsule Take 1 capsule by mouth daily.  Marland Kitchen senna (SENOKOT) 8.6 MG TABS tablet Take 2 tablets (17.2 mg total) by mouth at bedtime.  . sertraline (ZOLOFT) 50 MG tablet Take 50 mg by mouth daily.     Allergies:   Nsaids and Tolmetin   Social History   Social History  . Marital status: Single    Spouse name: N/A  . Number of children: N/A  . Years of education: N/A   Social History Main Topics  . Smoking status: Never Smoker  . Smokeless tobacco: Never Used  . Alcohol use No  . Drug use: No  . Sexual activity: Not Currently    Birth control/ protection: Surgical   Other Topics Concern  . Not on file   Social History Narrative  . No narrative on file     Family History: The patient's family history includes Coronary artery disease in her father; Diabetes in her father; Hypertension in her father and mother; Thyroid disease in her mother. ROS:   Please see the history of present illness.     All other systems reviewed and are negative.  EKGs/Labs/Other Studies Reviewed:    The following studies were reviewed today: Echocardiogram, event monitor, notes from Dr. Lorrene Reid  EKG:  EKG is not ordered today.   Recent  Labs: 09/04/2015: B Natriuretic Peptide 8.2 05/16/2016: ALT 14; BUN 37; Creatinine, Ser 6.88; Magnesium 1.7; Platelets 298 06/19/2016: Hemoglobin 11.9; Potassium 4.4; Sodium 139  Recent Lipid Panel    Component Value Date/Time   CHOL 196 06/18/2015 0708   TRIG 265 (H) 06/18/2015 0708   HDL 34 (L) 06/18/2015 0708   CHOLHDL 5.8 06/18/2015 0708   VLDL 53 (H) 06/18/2015 0708   LDLCALC 109 (H) 06/18/2015 0708    Physical Exam:    VS:  BP 110/80   Pulse 72   Ht $R'5\' 7"'pf$  (1.702 m)   Wt 239 lb (108.4 kg)   BMI 37.43 kg/m     Wt Readings from Last 3 Encounters:  08/28/16 239 lb (108.4 kg)  08/27/16 238 lb (108 kg)  06/03/16 235 lb 6.4 oz (106.8 kg)     GEN: Moderately obese Well nourished, well developed  in no acute distress HEENT: Normal NECK: No JVD; No carotid bruits LYMPHATICS: No lymphadenopathy CARDIAC: RRR, no murmurs, rubs, gallops. Tunneled dialysis catheter in right subclavian area. Left antecubital AV fistula with excellent thrill and bruit, well-healed RESPIRATORY:  Clear to auscultation without rales, wheezing or rhonchi  ABDOMEN: Soft, non-tender, non-distended MUSCULOSKELETAL:  No edema; No deformity  SKIN: Warm and dry NEUROLOGIC:  Alert and oriented x 3 PSYCHIATRIC:  Normal affect   ASSESSMENT:    1. SVT (supraventricular tachycardia) ([place])   2. ESRD (end stage renal disease) ([place])   3. Hypertension secondary to other renal disorders   4. Hypercholesterolemia    PLAN:    In order of problems listed above:  1. SVT: Almost certainly represents AV node reentry, much less likely to be WPW or adenosine responsive atrial tachycardia. Current dose of metoprolol since providing excellent control, although she still has symptomatic PACs. AV node ablation does not appear to be justified. While the risk of heart block and need for pacemaker is low, this would have potentially devastating implications in a patient that requires dialysis. 2. ESRD: Currently on transplant  list and possibly to undergo live related donor transplant if her daughter's workup is favorable. Receiving dialysis via catheter, soon to switch to the left arm AV fistula. 3. HTN: Excellent control. If the palpitations become more of a bother I would increase the dose of metoprolol and decrease the dose of lisinopril to provide better palliation. 4. HLP: On statin, target LDL under 100 in the absence of known vascular disease.   Medication Adjustments/Labs and Tests Ordered: Current medicines are reviewed at length with the patient today.  Concerns regarding medicines are outlined above.  No orders of the defined types were placed in this encounter.  No orders of the defined types were placed in this encounter.   Signed, Sanda Klein, MD  08/28/2016 9:57 AM    [facility]

## 2016-09-01 ENCOUNTER — Emergency Department (HOSPITAL_COMMUNITY)
Admission: EM | Admit: 2016-09-01 | Discharge: 2016-09-02 | Disposition: A | Discharge status: Home / Self Care | Payer: Medicare Other | Attending: Emergency Medicine | Admitting: Emergency Medicine

## 2016-09-01 ENCOUNTER — Encounter (HOSPITAL_COMMUNITY): Payer: Self-pay | Admitting: Emergency Medicine

## 2016-09-01 ENCOUNTER — Emergency Department (HOSPITAL_COMMUNITY): Payer: Medicare Other

## 2016-09-01 DIAGNOSIS — I120 Hypertensive chronic kidney disease with stage 5 chronic kidney disease or end stage renal disease: Secondary | ICD-10-CM | POA: Insufficient documentation

## 2016-09-01 DIAGNOSIS — Z992 Dependence on renal dialysis: Secondary | ICD-10-CM | POA: Insufficient documentation

## 2016-09-01 DIAGNOSIS — R51 Headache: Secondary | ICD-10-CM | POA: Diagnosis not present

## 2016-09-01 DIAGNOSIS — M542 Cervicalgia: Secondary | ICD-10-CM | POA: Insufficient documentation

## 2016-09-01 DIAGNOSIS — E1122 Type 2 diabetes mellitus with diabetic chronic kidney disease: Secondary | ICD-10-CM | POA: Insufficient documentation

## 2016-09-01 DIAGNOSIS — F419 Anxiety disorder, unspecified: Secondary | ICD-10-CM | POA: Diagnosis not present

## 2016-09-01 DIAGNOSIS — Z7982 Long term (current) use of aspirin: Secondary | ICD-10-CM | POA: Diagnosis not present

## 2016-09-01 DIAGNOSIS — Z79899 Other long term (current) drug therapy: Secondary | ICD-10-CM | POA: Insufficient documentation

## 2016-09-01 DIAGNOSIS — N186 End stage renal disease: Secondary | ICD-10-CM | POA: Insufficient documentation

## 2016-09-01 DIAGNOSIS — R519 Headache, unspecified: Secondary | ICD-10-CM

## 2016-09-01 LAB — COMPREHENSIVE METABOLIC PANEL
ALBUMIN: 3.4 g/dL — AB (ref 3.5–5.0)
ALT: 19 U/L (ref 14–54)
ANION GAP: 11 (ref 5–15)
AST: 24 U/L (ref 15–41)
Alkaline Phosphatase: 50 U/L (ref 38–126)
BILIRUBIN TOTAL: 0.5 mg/dL (ref 0.3–1.2)
BUN: 29 mg/dL — AB (ref 6–20)
CHLORIDE: 96 mmol/L — AB (ref 101–111)
CO2: 30 mmol/L (ref 22–32)
Calcium: 8.7 mg/dL — ABNORMAL LOW (ref 8.9–10.3)
Creatinine, Ser: 6.94 mg/dL — ABNORMAL HIGH (ref 0.44–1.00)
GFR calc Af Amer: 8 mL/min — ABNORMAL LOW (ref >60)
GFR calc non Af Amer: 7 mL/min — ABNORMAL LOW (ref >60)
GLUCOSE: 98 mg/dL (ref 65–99)
POTASSIUM: 4.9 mmol/L (ref 3.5–5.1)
Sodium: 137 mmol/L (ref 135–145)
TOTAL PROTEIN: 7.1 g/dL (ref 6.5–8.1)

## 2016-09-01 LAB — CBC WITH DIFFERENTIAL/PLATELET
BASOS ABS: 0 10*3/uL (ref 0.0–0.1)
BASOS PCT: 0 %
EOS ABS: 0.1 10*3/uL (ref 0.0–0.7)
Eosinophils Relative: 1 %
HEMATOCRIT: 41 % (ref 36.0–46.0)
Hemoglobin: 13.1 g/dL (ref 12.0–15.0)
LYMPHS ABS: 2.2 10*3/uL (ref 0.7–4.0)
Lymphocytes Relative: 32 %
MCH: 25.3 pg — ABNORMAL LOW (ref 26.0–34.0)
MCHC: 32 g/dL (ref 30.0–36.0)
MCV: 79.3 fL (ref 78.0–100.0)
MONO ABS: 0.6 10*3/uL (ref 0.1–1.0)
Monocytes Relative: 9 %
NEUTROS ABS: 3.9 10*3/uL (ref 1.7–7.7)
Neutrophils Relative %: 58 %
PLATELETS: 254 10*3/uL (ref 150–400)
RBC: 5.17 MIL/uL — ABNORMAL HIGH (ref 3.87–5.11)
RDW: 18.3 % — AB (ref 11.5–15.5)
WBC: 6.8 10*3/uL (ref 4.0–10.5)

## 2016-09-01 MED ORDER — METOCLOPRAMIDE HCL 5 MG/ML IJ SOLN
10.0000 mg | Freq: Once | INTRAMUSCULAR | Status: AC
  Administered 2016-09-01: 10 mg via INTRAVENOUS
  Filled 2016-09-01: qty 2

## 2016-09-01 MED ORDER — DIPHENHYDRAMINE HCL 50 MG/ML IJ SOLN
25.0000 mg | Freq: Once | INTRAMUSCULAR | Status: AC
  Administered 2016-09-01: 25 mg via INTRAVENOUS
  Filled 2016-09-01: qty 1

## 2016-09-01 NOTE — ED Notes (Signed)
Patient transported to CT 

## 2016-09-01 NOTE — ED Triage Notes (Signed)
Pt here for generalized HA x 3 days into neck; pt dialysis pt with last treatment today; pt took imitrex and flexiril per PCP without relief

## 2016-09-02 ENCOUNTER — Emergency Department (HOSPITAL_COMMUNITY): Payer: Medicare Other

## 2016-09-02 MED ORDER — LORAZEPAM 2 MG/ML IJ SOLN
1.0000 mg | Freq: Once | INTRAMUSCULAR | Status: AC
  Administered 2016-09-02: 1 mg via INTRAVENOUS
  Filled 2016-09-02: qty 1

## 2016-09-02 MED ORDER — AMLODIPINE BESYLATE 5 MG PO TABS: 5.0000 mg | ORAL_TABLET | Freq: Every day | ORAL | 0 refills | Status: DC

## 2016-09-02 NOTE — ED Notes (Signed)
Patient transported to MRI 

## 2016-09-02 NOTE — ED Provider Notes (Signed)
I was signed out patient is pending MRI for evaluation of possible posterior circulation stroke. MRI is negative. Patient advised to follow with primary care doctor within 3 days for close management. She gives a skin incision and this plan. She appears well-developed acute distress, vital signs were within her normal limits and she is safe for discharge.   Everlene Balls, MD 09/02/16 [phone]

## 2016-09-02 NOTE — ED Provider Notes (Signed)
[facility] DEPT Provider Note   CSN: [id number] Arrival date & time: 09/01/16  1654     History   Chief Complaint Chief Complaint  Patient presents with  . Headache    HPI Patricia [name] is a 41 y.o. female.  HPI   42 year old female with extensive past medical history here with chronic kidney disease, diabetes, here with headache. The patient states that she has a history of migraines. However, over the last week, she has noticed a dull, aching, posterior headache. She has had associated neck pain. Her primary care doctor as prescribed Imitrex as well as Flexeril without significant improvement. Earlier today, her symptoms seem to worsen after dialysis so she presents for evaluation. She does note a sensation of mild unsteadiness. Denies any vision change. Denies any focal numbness or weakness.  Past Medical History:  Diagnosis Date  . Acid reflux   . Anxiety   . Chronic kidney disease (CKD), stage III (moderate)   . Headache(784.0)    "related to chemo; sometimes weekly" (09/12/2013)  . High cholesterol   . History of blood transfusion "a couple"   "related to low counts"  . Hypertension   . Iron deficiency anemia    "get epogen shots q month" (02/20/2014)  . 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 Type II diabetes mellitus ([place])    "took me off my RX ~ 04/2013"    Patient Active Problem List   Diagnosis Date Noted  . ESRD (end stage renal disease) ([place]) 04/01/2016  . Anemia due to chronic kidney disease 04/01/2016  . GERD (gastroesophageal reflux disease) 04/01/2016  . Hypomagnesemia 04/28/2015  . Acute renal failure superimposed on stage 3 chronic kidney disease ([place]) 04/12/2014  . Atypical chest pain   . Chest pain 02/19/2014  . Hypokalemia 02/19/2014  . Nephrotic syndrome 10/02/2013  . Dyslipidemia 10/02/2013  . PAT (paroxysmal atrial tachycardia) ([place]) 09/29/2013  . Symptomatic anemia 06/29/2013    . Edema 06/29/2013  . Nausea and vomiting 06/29/2013  . Fever, unspecified 01/31/2013  . Glomerulonephritis 01/28/2013  . Syncope 01/28/2013  . Chest pain with moderate risk of acute coronary syndrome 07/04/2011  . Shortness of breath 07/04/2011  . Morbid obesity ([place]) 07/04/2011  . HTN (hypertension) 07/04/2011  . DM (diabetes mellitus) ([place]) 07/04/2011  . Hypercholesterolemia 07/04/2011    Past Surgical History:  Procedure Laterality Date  . ABDOMINAL HYSTERECTOMY  2010   "laparoscopic"  . ANKLE FRACTURE SURGERY Right 1994  . AV FISTULA PLACEMENT Left 04/14/2016   Procedure: ARTERIOVENOUS (AV) FISTULA CREATION;  Surgeon: Angelia Mould, MD;  Location: [facility];  Service: Vascular;  Laterality: Left;  . CARDIAC CATHETERIZATION  2000's  . FISTULOGRAM Left 04/07/2016   Procedure: FISTULOGRAM;  Surgeon: Waynetta Sandy, MD;  Location: [facility];  Service: Vascular;  Laterality: Left;  . FRACTURE SURGERY    . INSERTION OF DIALYSIS CATHETER Right 04/07/2016   Procedure: INSERTION OF DIALYSIS CATHETER;  Surgeon: Waynetta Sandy, MD;  Location: [facility];  Service: Vascular;  Laterality: Right;  . LIGATION OF ARTERIOVENOUS  FISTULA Left 04/14/2016   Procedure: LIGATION OF ARTERIOVENOUS  FISTULA;  Surgeon: Angelia Mould, MD;  Location: [facility];  Service: Vascular;  Laterality: Left;  . PATCH ANGIOPLASTY Left 06/19/2016   Procedure: PATCH ANGIOPLASTY LEFT ARTERIOVENOUS FISTULA;  Surgeon: Angelia Mould, MD;  Location: [facility];  Service: Vascular;  Laterality: Left;  . REVISON OF ARTERIOVENOUS  FISTULA Left 06/19/2016   Procedure: REVISION SUPERFICIALIZATION OF BRACHIOCEPHALIC ARTERIOVENOUS FISTULA;  Surgeon: Chuck Hint, MD;  Location: [facility];  Service: Vascular;  Laterality: Left;    OB History    Gravida Para Term Preterm AB Living   3             SAB TAB Ectopic Multiple Live Births         3         Home Medications    Prior to Admission  medications   Medication Sig Start Date End Date Taking? Authorizing Provider  allopurinol (ZYLOPRIM) 100 MG tablet Take 100 mg by mouth daily.   Yes [provider]  ALPRAZolam Prudy Feeler) 0.5 MG tablet Take 1 tablet by mouth 2 (two) times daily as needed for anxiety.  05/22/16 05/22/17 Yes [provider]  aspirin 81 MG chewable tablet Chew 1 tablet (81 mg total) by mouth daily. 10/03/13  Yes Rodolph Bong, MD  atorvastatin (LIPITOR) 40 MG tablet Take 40 mg by mouth daily. 03/14/14  Yes [provider]  calcium acetate (PHOSLO) 667 MG capsule Take 1,334 mg by mouth 3 (three) times daily with meals.  02/24/16 02/23/17 Yes [provider]  cyclobenzaprine (FLEXERIL) 5 MG tablet Take 5 mg by mouth 2 (two) times daily as needed for muscle spasms. 08/31/16  Yes [provider]  diphenhydramine-acetaminophen (TYLENOL PM) 25-500 MG TABS tablet Take 2 tablets by mouth at bedtime as needed (for sleep).    Yes [provider]  lisinopril (PRINIVIL,ZESTRIL) 10 MG tablet Take 10 mg by mouth daily.   Yes [provider]  metoprolol tartrate (LOPRESSOR) 25 MG tablet Take 12.5-25 mg by mouth See admin instructions. 12.5 mg on Sun Mon Wed Fri in the mornings ( Days not at Dialysis) 25 mg every evening (Sun - Sat) 04/17/16  Yes [provider]  multivitamin (RENA-VIT) TABS tablet Take 1 tablet by mouth daily.   Yes [provider]  omeprazole (PRILOSEC) 40 MG capsule Take 40 mg by mouth daily.  05/31/16  Yes [provider]  ondansetron (ZOFRAN) 8 MG tablet Take 8 mg by mouth every 8 (eight) hours as needed for nausea/vomiting. 08/28/16  Yes [provider]  senna (SENOKOT) 8.6 MG TABS tablet Take 2 tablets (17.2 mg total) by mouth at bedtime. 04/15/16  Yes Rhetta Mura, MD  sertraline (ZOLOFT) 50 MG tablet Take 50 mg by mouth daily. 04/24/16  Yes [provider]  SUMAtriptan (IMITREX) 25 MG tablet Take 25 mg by  mouth 2 (two) times daily as needed for migraine. May repeat in 2 hours if headache persists or recurs.   Yes [provider]    Family History Family History  Problem Relation Age of Onset  . Hypertension Mother   . Thyroid disease Mother   . Coronary artery disease Father   . Hypertension Father   . Diabetes Father     Social History Social History  Substance Use Topics  . Smoking status: Never Smoker  . Smokeless tobacco: Never Used  . Alcohol use No     Allergies   Nsaids and Tolmetin   Review of Systems Review of Systems  Constitutional: Positive for fatigue. Negative for chills and fever.  HENT: Negative for congestion, rhinorrhea and sore throat.   Eyes: Negative for visual disturbance.  Respiratory: Negative for cough, shortness of breath and wheezing.   Cardiovascular: Negative for chest pain and leg swelling.  Gastrointestinal: Negative for abdominal pain,  diarrhea, nausea and vomiting.  Genitourinary: Negative for dysuria, flank pain, vaginal bleeding and vaginal discharge.  Musculoskeletal: Positive for neck pain.  Skin: Negative for rash.  Allergic/Immunologic: Negative for immunocompromised state.  Neurological: Positive for headaches. Negative for syncope.  Hematological: Does not bruise/bleed easily.  All other systems reviewed and are negative.    Physical Exam Updated Vital Signs BP 133/80   Pulse 68   Temp 97.8 F (36.6 C) (Oral)   Resp 20   Ht $R'5\' 7"'ze$  (1.702 m)   Wt 107.5 kg (237 lb)   SpO2 98%   BMI 37.12 kg/m   Physical Exam  Constitutional: She is oriented to person, place, and time. She appears well-developed and well-nourished. No distress.  HENT:  Head: Normocephalic and atraumatic.  Eyes: Conjunctivae are normal.  Neck: Neck supple.  Cardiovascular: Normal rate, regular rhythm and normal heart sounds.  Exam reveals no friction rub.   No murmur heard. Pulmonary/Chest: Effort normal and breath sounds normal. No  respiratory distress. She has no wheezes. She has no rales.  Abdominal: She exhibits no distension.  Musculoskeletal: She exhibits no edema.  Neurological: She is alert and oriented to person, place, and time. She exhibits normal muscle tone.  Skin: Skin is warm. Capillary refill takes less than 2 seconds.  Psychiatric: She has a normal mood and affect.  Nursing note and vitals reviewed.   Neurological Exam:  Mental Status: Alert and oriented to person, place, and time. Attention and concentration normal. Speech clear. Recent memory is intact. Cranial Nerves: Visual fields grossly intact. EOMI and PERRLA. No nystagmus noted. Facial sensation intact at forehead, maxillary cheek, and chin/mandible bilaterally. No facial asymmetry or weakness. Hearing grossly normal. Uvula is midline, and palate elevates symmetrically. Normal SCM and trapezius strength. Tongue midline without fasciculations. Motor: Muscle strength 5/5 in proximal and distal UE and LE bilaterally. No pronator drift. Muscle tone normal. Reflexes: 2+ and symmetrical in all four extremities.  Sensation: Intact to light touch in upper and lower extremities distally bilaterally.  Coordination: Mild past pointing on left, normal on right. Dysmetria with HTS on left.   ED Treatments / Results  Labs (all labs ordered are listed, but only abnormal results are displayed) Labs Reviewed  CBC WITH DIFFERENTIAL/PLATELET - Abnormal; Notable for the following:       Result Value   RBC 5.17 (*)    MCH 25.3 (*)    RDW 18.3 (*)    All other components within normal limits  COMPREHENSIVE METABOLIC PANEL - Abnormal; Notable for the following:    Chloride 96 (*)    BUN 29 (*)    Creatinine, Ser 6.94 (*)    Calcium 8.7 (*)    Albumin 3.4 (*)    GFR calc non Af Amer 7 (*)    GFR calc Af Amer 8 (*)    All other components within normal limits    EKG  EKG Interpretation None       Radiology Ct Head Wo Contrast  Result Date:  09/01/2016 CLINICAL DATA:  Headache with nausea and photosensitivity for 3 days. EXAM: CT HEAD WITHOUT CONTRAST TECHNIQUE: Contiguous axial images were obtained from the base of the skull through the vertex without intravenous contrast. COMPARISON:  03/24/2016 FINDINGS: Brain: There is no intracranial hemorrhage, mass or evidence of acute infarction. There is no extra-axial fluid collection. Gray matter and white matter appear normal. Cerebral volume is normal for age. Brainstem and posterior fossa are unremarkable. The CSF spaces appear normal.  Vascular: No hyperdense vessel or unexpected calcification. Skull: Normal. Negative for fracture or focal lesion. Sinuses/Orbits: No acute finding. Other: None. IMPRESSION: Normal brain Electronically Signed   By: Andreas Newport M.D.   On: 09/01/2016 23:35   Mr Brain Wo Contrast  Result Date: 09/02/2016 CLINICAL DATA:  Generalized headache for 3 days radiating to the neck. History of end-stage renal disease on dialysis, dialyzed today. History of hypertension, headache. EXAM: MRI HEAD WITHOUT CONTRAST TECHNIQUE: Multiplanar, multiecho pulse sequences of the brain and surrounding structures were obtained without intravenous contrast. COMPARISON:  CT HEAD September 01, 2016 FINDINGS: BRAIN: No reduced diffusion to suggest acute ischemia. No susceptibility artifact to suggest hemorrhage. The ventricles and sulci are normal for patient's age. No suspicious parenchymal signal, mass or mass effect. No abnormal extra-axial fluid collections. VASCULAR: Normal major intracranial vascular flow voids present at skull base. SKULL AND UPPER CERVICAL SPINE: No abnormal sellar expansion. No suspicious calvarial bone marrow signal. 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 Craniocervical junction maintained. SINUSES/ORBITS: The mastoid air-cells and included paranasal sinuses are well-aerated. The included ocular globes and orbital contents  are non-suspicious. OTHER: None. IMPRESSION: Normal noncontrast MRI head. Electronically Signed   By: Elon Alas M.D.   On: 09/02/2016 03:25    Procedures Procedures (including critical care time)  Medications Ordered in ED Medications  metoCLOPramide (REGLAN) injection 10 mg (10 mg Intravenous Given 09/01/16 2253)  diphenhydrAMINE (BENADRYL) injection 25 mg (25 mg Intravenous Given 09/01/16 2258)  LORazepam (ATIVAN) injection 1 mg (1 mg Intravenous Given 09/02/16 0201)     Initial Impression / Assessment and Plan / ED Course  I have reviewed the triage vital signs and the nursing notes.  Pertinent labs & imaging results that were available during my care of the patient were reviewed by me and considered in my medical decision making (see chart for details).     41 yo F with h/o ESRD here with headache. Pt has h/o chronic hA and migraines but exam does show possible ataxia/signs of cerebellar abnormalities on left. CT head is negative. Lab work is at baseline. Will check MRI to eval for underlying ischemia, mass lesions. If normal, may consider complex migraine if sx improve with migraine treatment. Reglan/Benadryl given. Pt updated and in agreement.  Final Clinical Impressions(s) / ED Diagnoses   Final diagnoses:  Acute nonintractable headache, unspecified headache type    New Prescriptions Current Discharge Medication List       Duffy Bruce, MD 09/02/16 [phone]

## 2016-12-01 ENCOUNTER — Other Ambulatory Visit: Payer: Self-pay

## 2016-12-01 ENCOUNTER — Emergency Department (HOSPITAL_COMMUNITY)
Admission: EM | Admit: 2016-12-01 | Discharge: 2016-12-01 | Disposition: A | Discharge status: Home / Self Care | Payer: Medicare Other | Attending: Emergency Medicine | Admitting: Emergency Medicine

## 2016-12-01 DIAGNOSIS — Z862 Personal history of diseases of the blood and blood-forming organs and certain disorders involving the immune mechanism: Secondary | ICD-10-CM | POA: Insufficient documentation

## 2016-12-01 DIAGNOSIS — N186 End stage renal disease: Secondary | ICD-10-CM | POA: Insufficient documentation

## 2016-12-01 DIAGNOSIS — Z992 Dependence on renal dialysis: Secondary | ICD-10-CM | POA: Diagnosis not present

## 2016-12-01 DIAGNOSIS — R42 Dizziness and giddiness: Secondary | ICD-10-CM

## 2016-12-01 DIAGNOSIS — E1122 Type 2 diabetes mellitus with diabetic chronic kidney disease: Secondary | ICD-10-CM | POA: Diagnosis not present

## 2016-12-01 DIAGNOSIS — I120 Hypertensive chronic kidney disease with stage 5 chronic kidney disease or end stage renal disease: Secondary | ICD-10-CM | POA: Insufficient documentation

## 2016-12-01 DIAGNOSIS — Z79899 Other long term (current) drug therapy: Secondary | ICD-10-CM | POA: Diagnosis not present

## 2016-12-01 DIAGNOSIS — Z7982 Long term (current) use of aspirin: Secondary | ICD-10-CM | POA: Diagnosis not present

## 2016-12-01 LAB — CBC
HCT: 31.8 % — ABNORMAL LOW (ref 36.0–46.0)
HEMOGLOBIN: 9.8 g/dL — AB (ref 12.0–15.0)
MCH: 27.1 pg (ref 26.0–34.0)
MCHC: 30.8 g/dL (ref 30.0–36.0)
MCV: 88.1 fL (ref 78.0–100.0)
PLATELETS: 280 10*3/uL (ref 150–400)
RBC: 3.61 MIL/uL — AB (ref 3.87–5.11)
RDW: 21.5 % — ABNORMAL HIGH (ref 11.5–15.5)
WBC: 4.8 10*3/uL (ref 4.0–10.5)

## 2016-12-01 LAB — COMPREHENSIVE METABOLIC PANEL
ALBUMIN: 3.7 g/dL (ref 3.5–5.0)
ALK PHOS: 56 U/L (ref 38–126)
ALT: 20 U/L (ref 14–54)
ANION GAP: 15 (ref 5–15)
AST: 30 U/L (ref 15–41)
BILIRUBIN TOTAL: 0.2 mg/dL — AB (ref 0.3–1.2)
BUN: 47 mg/dL — AB (ref 6–20)
CALCIUM: 7.5 mg/dL — AB (ref 8.9–10.3)
CO2: 26 mmol/L (ref 22–32)
Chloride: 96 mmol/L — ABNORMAL LOW (ref 101–111)
Creatinine, Ser: 8.51 mg/dL — ABNORMAL HIGH (ref 0.44–1.00)
GFR calc Af Amer: 6 mL/min — ABNORMAL LOW (ref >60)
GFR calc non Af Amer: 5 mL/min — ABNORMAL LOW (ref >60)
GLUCOSE: 132 mg/dL — AB (ref 65–99)
POTASSIUM: 3.8 mmol/L (ref 3.5–5.1)
SODIUM: 137 mmol/L (ref 135–145)
Total Protein: 7.5 g/dL (ref 6.5–8.1)

## 2016-12-01 LAB — I-STAT BETA HCG BLOOD, ED (MC, WL, AP ONLY): I-stat hCG, quantitative: <5 m[IU]/mL (ref <5)

## 2016-12-01 LAB — TYPE AND SCREEN
ABO/RH(D): O NEG
Antibody Screen: NEGATIVE

## 2016-12-01 NOTE — ED Provider Notes (Signed)
[facility] EMERGENCY DEPARTMENT Provider Note   CSN: [id number] Arrival date & time: 12/01/16  1328     History   Chief Complaint Chief Complaint  Patient presents with  . Dizziness  . Anemia    HPI Patricia [name] is a 41 y.o. female.  41 year old female with past medical history including ESRD on HD T/Th/Sat, HTN, HLD, T2DM, GERD who p/w lightheadedness.  Patient states that for the past 6 or 7 days she has had intermittent episodes of lightheadedness that she describes as feeling like she is going to pass out but she has not had any actual syncope episodes.  She will shake her head to try to resolve the symptoms once they begin.  The episodes are not related to dialysis sessions, are worse when she is moving around.  She denies any chest pain or shortness of breath with the lightheadedness episodes.  No fevers, vomiting, diarrhea, or recent illness.  No bloody or black stools.  She has chronic vision problems and follows with an ophthalmologist, is waiting to get glasses and states that the ophthalmologist is following her for some problems with her left eye.  No recent vision changes.  She reports that her grandchild bumped her head with a toy on her right forehead last week and it left a large knot.  Today she had some lightheadedness/dizziness after dialysis session and became concerned about something going on in her head.  She denies any headache.  No extremity numbness or weakness.  No recent new medications or medication changes.   The history is provided by the patient.  Dizziness  Anemia     Past Medical History:  Diagnosis Date  . Acid reflux   . Anxiety   . Chronic kidney disease (CKD), stage III (moderate)   . Headache(784.0)    "related to chemo; sometimes weekly" (09/12/2013)  . High cholesterol   . History of blood transfusion "a couple"   "related to low counts"  . Hypertension   . Iron deficiency anemia    "get epogen shots q month"  (02/20/2014)  . 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 Type II diabetes mellitus ([place])    "took me off my RX ~ 04/2013"    Patient Active Problem List   Diagnosis Date Noted  . ESRD (end stage renal disease) ([place]) 04/01/2016  . Anemia due to chronic kidney disease 04/01/2016  . GERD (gastroesophageal reflux disease) 04/01/2016  . Hypomagnesemia 04/28/2015  . Acute renal failure superimposed on stage 3 chronic kidney disease ([place]) 04/12/2014  . Atypical chest pain   . Chest pain 02/19/2014  . Hypokalemia 02/19/2014  . Nephrotic syndrome 10/02/2013  . Dyslipidemia 10/02/2013  . PAT (paroxysmal atrial tachycardia) ([place]) 09/29/2013  . Symptomatic anemia 06/29/2013  . Edema 06/29/2013  . Nausea and vomiting 06/29/2013  . Fever, unspecified 01/31/2013  . Glomerulonephritis 01/28/2013  . Syncope 01/28/2013  . Chest pain with moderate risk of acute coronary syndrome 07/04/2011  . Shortness of breath 07/04/2011  . Morbid obesity ([place]) 07/04/2011  . HTN (hypertension) 07/04/2011  . DM (diabetes mellitus) ([place]) 07/04/2011  . Hypercholesterolemia 07/04/2011    Past Surgical History:  Procedure Laterality Date  . ABDOMINAL HYSTERECTOMY  2010   "laparoscopic"  . ANKLE FRACTURE SURGERY Right 1994  . AV FISTULA PLACEMENT Left 04/14/2016   Procedure: ARTERIOVENOUS (AV) FISTULA CREATION;  Surgeon: Angelia Mould, MD;  Location: [facility];  Service: Vascular;  Laterality: Left;  . CARDIAC CATHETERIZATION  2000's  . FISTULOGRAM Left 04/07/2016   Procedure: FISTULOGRAM;  Surgeon: Waynetta Sandy, MD;  Location: [facility];  Service: Vascular;  Laterality: Left;  . FRACTURE SURGERY    . INSERTION OF DIALYSIS CATHETER Right 04/07/2016   Procedure: INSERTION OF DIALYSIS CATHETER;  Surgeon: Waynetta Sandy, MD;  Location: [facility];  Service: Vascular;  Laterality: Right;  . LIGATION OF ARTERIOVENOUS  FISTULA Left 04/14/2016    Procedure: LIGATION OF ARTERIOVENOUS  FISTULA;  Surgeon: Angelia Mould, MD;  Location: [facility];  Service: Vascular;  Laterality: Left;  . PATCH ANGIOPLASTY Left 06/19/2016   Procedure: PATCH ANGIOPLASTY LEFT ARTERIOVENOUS FISTULA;  Surgeon: Angelia Mould, MD;  Location: [facility];  Service: Vascular;  Laterality: Left;  . REVISON OF ARTERIOVENOUS FISTULA Left 06/19/2016   Procedure: REVISION SUPERFICIALIZATION OF BRACHIOCEPHALIC ARTERIOVENOUS FISTULA;  Surgeon: Angelia Mould, MD;  Location: [facility] OR;  Service: Vascular;  Laterality: Left;    OB History    Gravida Para Term Preterm AB Living   3             SAB TAB Ectopic Multiple Live Births         3         Home Medications    Prior to Admission medications   Medication Sig Start Date End Date Taking? Authorizing Provider  allopurinol (ZYLOPRIM) 100 MG tablet Take 100 mg by mouth daily.    [provider]  ALPRAZolam Duanne Moron) 0.5 MG tablet Take 1 tablet by mouth 2 (two) times daily as needed for anxiety.  05/22/16 05/22/17  [provider]  aspirin 81 MG chewable tablet Chew 1 tablet (81 mg total) by mouth daily. 10/03/13   Eugenie Filler, MD  atorvastatin (LIPITOR) 40 MG tablet Take 40 mg by mouth daily. 03/14/14   [provider]  calcium acetate (PHOSLO) 667 MG capsule Take 1,334 mg by mouth 3 (three) times daily with meals.  02/24/16 02/23/17  [provider]  cyclobenzaprine (FLEXERIL) 5 MG tablet Take 5 mg by mouth 2 (two) times daily as needed for muscle spasms. 08/31/16   [provider]  diphenhydramine-acetaminophen (TYLENOL PM) 25-500 MG TABS tablet Take 2 tablets by mouth at bedtime as needed (for sleep).     [provider]  lisinopril (PRINIVIL,ZESTRIL) 10 MG tablet Take 10 mg by mouth daily.    [provider]  metoprolol tartrate (LOPRESSOR) 25 MG tablet Take 12.5-25 mg by mouth See admin instructions. 12.5 mg on Sun Mon Wed Fri in the mornings (  Days not at Dialysis) 25 mg every evening (Sun - Sat) 04/17/16   [provider]  multivitamin (RENA-VIT) TABS tablet Take 1 tablet by mouth daily.    [provider]  omeprazole (PRILOSEC) 40 MG capsule Take 40 mg by mouth daily.  05/31/16   [provider]  ondansetron (ZOFRAN) 8 MG tablet Take 8 mg by mouth every 8 (eight) hours as needed for nausea/vomiting. 08/28/16   [provider]  senna (SENOKOT) 8.6 MG TABS tablet Take 2 tablets (17.2 mg total) by mouth at bedtime. 04/15/16   Nita Sells, MD  sertraline (ZOLOFT) 50 MG tablet Take 50 mg by mouth daily. 04/24/16   [provider]  SUMAtriptan (IMITREX) 25 MG tablet Take 25 mg by mouth 2 (two) times daily as needed for migraine. May repeat in 2 hours if headache persists or recurs.    [provider]    Family History Family History  Problem Relation Age of Onset  . Hypertension Mother   . Thyroid disease Mother   . Coronary artery disease Father   . Hypertension Father   . Diabetes Father     Social History Social History   Tobacco Use  . Smoking status: Never Smoker  . Smokeless tobacco: Never Used  Substance Use Topics  . Alcohol use: No  . Drug use: No     Allergies   Nsaids and Tolmetin   Review of Systems Review of Systems  Neurological: Positive for dizziness.   All other systems reviewed and are negative except that which was mentioned in HPI   Physical Exam Updated Vital Signs BP 104/77 (BP Location: Right Arm)   Pulse 77   Temp 98.3 F (36.8 C) (Oral)   Resp 16   SpO2 100%   Physical Exam  Constitutional: She is oriented to person, place, and time. She appears well-developed and well-nourished. No distress.  Awake, alert  HENT:  Head: Normocephalic and atraumatic.  Eyes: Conjunctivae and EOM are normal. Pupils are equal, round, and reactive to light.  Neck: Neck supple.  Cardiovascular: Normal rate, regular rhythm and normal heart  sounds.  No murmur heard. Pulmonary/Chest: Effort normal and breath sounds normal. No respiratory distress.  Abdominal: Soft. Bowel sounds are normal. She exhibits no distension. There is no tenderness.  Musculoskeletal: She exhibits no edema.  Neurological: She is alert and oriented to person, place, and time. She has normal reflexes. No cranial nerve deficit. She exhibits normal muscle tone.  Fluent speech, normal finger-to-nose testing, negative pronator drift, no clonus 5/5 strength and normal sensation x all 4 extremities  Skin: Skin is warm and dry.  Central line R upper chest; AV fistula L upper arm, no erythema or surrounding skin changes  Psychiatric: She has a normal mood and affect. Judgment and thought content normal.  Nursing note and vitals reviewed.    ED Treatments / Results  Labs (all labs ordered are listed, but only abnormal results are displayed) Labs Reviewed  CBC - Abnormal; Notable for the following components:      Result Value   RBC 3.61 (*)    Hemoglobin 9.8 (*)    HCT 31.8 (*)    RDW 21.5 (*)    All other components within normal limits  COMPREHENSIVE METABOLIC PANEL - Abnormal; Notable for the following components:   Chloride 96 (*)    Glucose, Bld 132 (*)    BUN 47 (*)    Creatinine, Ser 8.51 (*)    Calcium 7.5 (*)    Total Bilirubin 0.2 (*)    GFR calc non Af Amer 5 (*)    GFR calc Af Amer 6 (*)    All other components within normal limits  CBG MONITORING, ED  I-STAT BETA HCG BLOOD, ED ([facility], [facility], [facility] ONLY)  TYPE AND SCREEN    EKG  EKG Interpretation  Date/Time:  Tuesday December 01 2016 13:38:53 EST Ventricular Rate:  78 PR Interval:  152 QRS Duration: 90 QT Interval:  412 QTC Calculation: 469 R Axis:   62 Text Interpretation:  Normal sinus rhythm Normal ECG No significant change since last tracing Confirmed by Theotis Burrow [phone]) on 12/01/2016 3:53:34 PM       Radiology No results found.  Procedures Procedures (including  critical care time)  Medications Ordered in ED Medications - No data to display  Orthostatic VS for the past 24  hrs:  BP- Lying Pulse- Lying BP- Sitting Pulse- Sitting BP- Standing at 0 minutes Pulse- Standing at 0 minutes  12/01/16 1720 121/61 72 116/72 76 111/73 86     Initial Impression / Assessment and Plan / ED Course  I have reviewed the triage vital signs and the nursing notes.  Pertinent labs & imaging results that were available during my care of the patient were reviewed by me and considered in my medical decision making (see chart for details).     Pt w/ 1 week intermittent lightheadedness, no other associated neuro sx and no associated CP/SOB. She was ill-appearing on exam with normal vital signs, normal neurologic exam.  EKG unremarkable.  Orthostatic vital signs reassuring.  She mentioned a hemoglobin of 7 last week but her hemoglobin here is 9.8 which appears consistent with hemoglobin from several years ago.  She has had no symptoms to suggest GI bleeding.  Rest of her lab work is reassuring and I have recommended that she continue to follow with PCP.  Extensively reviewed return precautions and patient discharged in satisfactory condition.  Final Clinical Impressions(s) / ED Diagnoses   Final diagnoses:  Lightheadedness    ED Discharge Orders    None       Little, Wenda Overland, MD 12/01/16 [phone]

## 2016-12-01 NOTE — ED Triage Notes (Addendum)
Feels like she is going to pass out   X 6-7 days , had dialysis today and told them  , they got cultures  But no other labs , states her HGB was & last week, pt states she  Got bumped in heaD has knot last week by her granddaughter,

## 2016-12-01 NOTE — ED Notes (Signed)
Pt unable to urinate.

## 2017-03-22 ENCOUNTER — Ambulatory Visit (INDEPENDENT_AMBULATORY_CARE_PROVIDER_SITE_OTHER): Payer: Medicare Other | Admitting: Surgery

## 2017-03-22 ENCOUNTER — Other Ambulatory Visit: Payer: Self-pay

## 2017-03-22 ENCOUNTER — Encounter: Payer: Self-pay | Admitting: Surgery

## 2017-03-22 ENCOUNTER — Encounter: Payer: Self-pay | Admitting: *Deleted

## 2017-03-22 ENCOUNTER — Other Ambulatory Visit: Payer: Self-pay | Admitting: *Deleted

## 2017-03-22 VITALS — BP 177/97 | HR 72 | Resp 18 | Ht 67.0 in | Wt 259.0 lb

## 2017-03-22 DIAGNOSIS — Z992 Dependence on renal dialysis: Secondary | ICD-10-CM

## 2017-03-22 DIAGNOSIS — N186 End stage renal disease: Secondary | ICD-10-CM

## 2017-03-22 NOTE — H&P (View-Only) (Signed)
Vascular and Vein Specialist of [facility]  [name] MRN: [id number] DOB: 1975-11-21 Sex: female   REASON FOR VISIT:    ESRD  HISOTRY OF PRESENT ILLNESS:    Patricia [name] is a 42 y.o. female who is here today for evaluation of an infiltration in her left arm fistula.  She has a history of a left brachiocephalic fistula placed on 04/14/2016.  She underwent elevation of her fistula with patch angioplasty by Dr. Scot Dock on 06/19/2016.  She recently had a fistulogram which showed several areas of narrowing which were successfully treated with balloon angioplasty.  The dialysis nurses are telling her that her fistula is too deep, which is why she is having difficulty with cannulation   PAST MEDICAL HISTORY:   Past Medical History:  Diagnosis Date  . Acid reflux   . Anxiety   . Chronic kidney disease (CKD), stage III (moderate) (HCC)   . Headache(784.0)    "related to chemo; sometimes weekly" (09/12/2013)  . High cholesterol   . History of blood transfusion "a couple"   "related to low counts"  . Hypertension   . Iron deficiency anemia    "get epogen shots q month" (02/20/2014)  . 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 Type II diabetes mellitus ([place])    "took me off my RX ~ 04/2013"     FAMILY HISTORY:   Family History  Problem Relation Age of Onset  . Hypertension Mother   . Thyroid disease Mother   . Coronary artery disease Father   . Hypertension Father   . Diabetes Father     SOCIAL HISTORY:   Social History   Tobacco Use  . Smoking status: Never Smoker  . Smokeless tobacco: Never Used  Substance Use Topics  . Alcohol use: No     ALLERGIES:   Allergies  Allergen Reactions  . Nsaids Other (See Comments)    Cannot take due to Kidney disease  . Tolmetin Other (See Comments)    Cannot take due to Kidney Disease     CURRENT MEDICATIONS:    Current Outpatient Medications  Medication Sig Dispense Refill  . allopurinol (ZYLOPRIM) 100 MG tablet Take 100 mg by mouth daily.    Marland Kitchen ALPRAZolam (XANAX) 0.5 MG tablet Take 1 tablet by mouth 2 (two) times daily as needed for anxiety.     Marland Kitchen aspirin 81 MG chewable tablet Chew 1 tablet (81 mg total) by mouth daily.    Marland Kitchen atorvastatin (LIPITOR) 40 MG tablet Take 40 mg by mouth daily.  3  . diphenhydramine-acetaminophen (TYLENOL PM) 25-500 MG TABS tablet Take 2 tablets by mouth at bedtime as needed (for sleep).     Marland Kitchen lisinopril (PRINIVIL,ZESTRIL) 10 MG tablet Take 10 mg by mouth daily.    . metoprolol tartrate (LOPRESSOR) 25 MG tablet Take 12.5-25 mg by mouth See admin instructions. 12.5 mg on Sun Mon Wed Fri in the mornings ( Days not at Dialysis) 25 mg every evening (Sun - Sat)    . multivitamin (RENA-VIT) TABS tablet Take 1 tablet by mouth daily.    Marland Kitchen omeprazole (PRILOSEC) 40 MG capsule Take 40 mg by mouth daily.     . ondansetron (ZOFRAN) 8 MG tablet Take 8 mg by mouth every 8 (eight) hours as needed for nausea/vomiting.  0  . senna (SENOKOT) 8.6 MG TABS tablet Take 2 tablets (17.2 mg total) by mouth at bedtime. [place]  each 0  . cyclobenzaprine (FLEXERIL) 5 MG tablet Take 5 mg by mouth 2 (two) times daily as needed for muscle spasms.  0  . sertraline (ZOLOFT) 50 MG tablet Take 50 mg by mouth daily.    . SUMAtriptan (IMITREX) 25 MG tablet Take 25 mg by mouth 2 (two) times daily as needed for migraine. May repeat in 2 hours if headache persists or recurs.     No current facility-administered medications for this visit.     REVIEW OF SYSTEMS:   '[X]'$  denotes positive finding, $RemoveBeforeDEI'[ ]'[id number]$  denotes negative finding Cardiac  Comments:  Chest pain or chest pressure:    Shortness of breath upon exertion:    Short of breath when lying flat:    Irregular heart rhythm:        Vascular    Pain in calf, thigh, or hip brought on by ambulation:    Pain in feet at night that wakes you up from your sleep:      Blood clot in your veins:    Leg swelling:         Pulmonary    Oxygen at home:    Productive cough:     Wheezing:         Neurologic    Sudden weakness in arms or legs:     Sudden numbness in arms or legs:     Sudden onset of difficulty speaking or slurred speech:    Temporary loss of vision in one eye:     Problems with dizziness:         Gastrointestinal    Blood in stool:     Vomited blood:         Genitourinary    Burning when urinating:     Blood in urine:        Psychiatric    Major depression:         Hematologic    Bleeding problems:    Problems with blood clotting too easily:        Skin    Rashes or ulcers:        Constitutional    Fever or chills:      PHYSICAL EXAM:   Vitals:   03/22/17 0934  BP: (!) 177/97  Pulse: 72  Resp: 18  SpO2: 98%  Weight: 259 lb (117.5 kg)  Height: $Remove'5\' 7"'UPZxfBK$  (1.702 m)    GENERAL: The patient is a well-nourished female, in no acute distress. The vital signs are documented above. CARDIAC: There is a regular rate and rhythm.  VASCULAR: The fistula is easily palpable in the distal upper arm however it becomes very difficult to feel beginning in the mid upper arm. PULMONARY: Non-labored respirations MUSCULOSKELETAL: There are no major deformities or cyanosis. NEUROLOGIC: No focal weakness or paresthesias are detected. SKIN: There are no ulcers or rashes noted. PSYCHIATRIC: The patient has a normal affect.  STUDIES:   I evaluated her fistula with the sono site.  Beginning in the mid upper arm the vein becomes approximately 2 cm deep  MEDICAL ISSUES:   I spoke with Dr. Augustin Coupe who described his most recent intervention and that the fistula was widely patent.  There was some narrowing at the arterial venous anastomosis however this did not appear to be hemodynamically significant.  After evaluating the fistula with ultrasound and having the patient tell me that the challenges with cannulation are secondary to the vein being the,  I feel the next step is to redo the  elevation of her fistula.  I discussed with the patient that this would be difficult given her prior surgeries and the scar tissue that we would have to encounter.  Hopefully she will not need a catheter.  She also understands that she may need to have revision of her fistula which could result in placement of a graft as an interposition graft.  All of her questions were answered.  This is been scheduled on Friday, March 29    [name], MD Vascular and Vein Specialists of [facility] [phone] Pager [phone]

## 2017-03-22 NOTE — Progress Notes (Signed)
Vascular and Vein Specialist of [facility]  [name] MRN: [id number] DOB: 10-08-1975 Sex: female   REASON FOR VISIT:    ESRD  HISOTRY OF PRESENT ILLNESS:    Patricia [name] is a 42 y.o. female who is here today for evaluation of an infiltration in her left arm fistula.  She has a history of a left brachiocephalic fistula placed on 04/14/2016.  She underwent elevation of her fistula with patch angioplasty by Dr. Scot Dock on 06/19/2016.  She recently had a fistulogram which showed several areas of narrowing which were successfully treated with balloon angioplasty.  The dialysis nurses are telling her that her fistula is too deep, which is why she is having difficulty with cannulation   PAST MEDICAL HISTORY:   Past Medical History:  Diagnosis Date  . Acid reflux   . Anxiety   . Chronic kidney disease (CKD), stage III (moderate) (HCC)   . Headache(784.0)    "related to chemo; sometimes weekly" (09/12/2013)  . High cholesterol   . History of blood transfusion "a couple"   "related to low counts"  . Hypertension   . Iron deficiency anemia    "get epogen shots q month" (02/20/2014)  . 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 Type II diabetes mellitus ([place])    "took me off my RX ~ 04/2013"     FAMILY HISTORY:   Family History  Problem Relation Age of Onset  . Hypertension Mother   . Thyroid disease Mother   . Coronary artery disease Father   . Hypertension Father   . Diabetes Father     SOCIAL HISTORY:   Social History   Tobacco Use  . Smoking status: Never Smoker  . Smokeless tobacco: Never Used  Substance Use Topics  . Alcohol use: No     ALLERGIES:   Allergies  Allergen Reactions  . Nsaids Other (See Comments)    Cannot take due to Kidney disease  . Tolmetin Other (See Comments)    Cannot take due to Kidney Disease     CURRENT MEDICATIONS:    Current Outpatient Medications  Medication Sig Dispense Refill  . allopurinol (ZYLOPRIM) 100 MG tablet Take 100 mg by mouth daily.    Marland Kitchen ALPRAZolam (XANAX) 0.5 MG tablet Take 1 tablet by mouth 2 (two) times daily as needed for anxiety.     Marland Kitchen aspirin 81 MG chewable tablet Chew 1 tablet (81 mg total) by mouth daily.    Marland Kitchen atorvastatin (LIPITOR) 40 MG tablet Take 40 mg by mouth daily.  3  . diphenhydramine-acetaminophen (TYLENOL PM) 25-500 MG TABS tablet Take 2 tablets by mouth at bedtime as needed (for sleep).     Marland Kitchen lisinopril (PRINIVIL,ZESTRIL) 10 MG tablet Take 10 mg by mouth daily.    . metoprolol tartrate (LOPRESSOR) 25 MG tablet Take 12.5-25 mg by mouth See admin instructions. 12.5 mg on Sun Mon Wed Fri in the mornings ( Days not at Dialysis) 25 mg every evening (Sun - Sat)    . multivitamin (RENA-VIT) TABS tablet Take 1 tablet by mouth daily.    Marland Kitchen omeprazole (PRILOSEC) 40 MG capsule Take 40 mg by mouth daily.     . ondansetron (ZOFRAN) 8 MG tablet Take 8 mg by mouth every 8 (eight) hours as needed for nausea/vomiting.  0  . senna (SENOKOT) 8.6 MG TABS tablet Take 2 tablets (17.2 mg total) by mouth at bedtime. [place]  each 0  . cyclobenzaprine (FLEXERIL) 5 MG tablet Take 5 mg by mouth 2 (two) times daily as needed for muscle spasms.  0  . sertraline (ZOLOFT) 50 MG tablet Take 50 mg by mouth daily.    . SUMAtriptan (IMITREX) 25 MG tablet Take 25 mg by mouth 2 (two) times daily as needed for migraine. May repeat in 2 hours if headache persists or recurs.     No current facility-administered medications for this visit.     REVIEW OF SYSTEMS:   '[X]'$  denotes positive finding, $RemoveBeforeDEI'[ ]'[id number]$  denotes negative finding Cardiac  Comments:  Chest pain or chest pressure:    Shortness of breath upon exertion:    Short of breath when lying flat:    Irregular heart rhythm:        Vascular    Pain in calf, thigh, or hip brought on by ambulation:    Pain in feet at night that wakes you up from your sleep:      Blood clot in your veins:    Leg swelling:         Pulmonary    Oxygen at home:    Productive cough:     Wheezing:         Neurologic    Sudden weakness in arms or legs:     Sudden numbness in arms or legs:     Sudden onset of difficulty speaking or slurred speech:    Temporary loss of vision in one eye:     Problems with dizziness:         Gastrointestinal    Blood in stool:     Vomited blood:         Genitourinary    Burning when urinating:     Blood in urine:        Psychiatric    Major depression:         Hematologic    Bleeding problems:    Problems with blood clotting too easily:        Skin    Rashes or ulcers:        Constitutional    Fever or chills:      PHYSICAL EXAM:   Vitals:   03/22/17 0934  BP: (!) 177/97  Pulse: 72  Resp: 18  SpO2: 98%  Weight: 259 lb (117.5 kg)  Height: $Remove'5\' 7"'kaZZtsi$  (1.702 m)    GENERAL: The patient is a well-nourished female, in no acute distress. The vital signs are documented above. CARDIAC: There is a regular rate and rhythm.  VASCULAR: The fistula is easily palpable in the distal upper arm however it becomes very difficult to feel beginning in the mid upper arm. PULMONARY: Non-labored respirations MUSCULOSKELETAL: There are no major deformities or cyanosis. NEUROLOGIC: No focal weakness or paresthesias are detected. SKIN: There are no ulcers or rashes noted. PSYCHIATRIC: The patient has a normal affect.  STUDIES:   I evaluated her fistula with the sono site.  Beginning in the mid upper arm the vein becomes approximately 2 cm deep  MEDICAL ISSUES:   I spoke with Dr. Augustin Coupe who described his most recent intervention and that the fistula was widely patent.  There was some narrowing at the arterial venous anastomosis however this did not appear to be hemodynamically significant.  After evaluating the fistula with ultrasound and having the patient tell me that the challenges with cannulation are secondary to the vein being the,  I feel the next step is to redo the  elevation of her fistula.  I discussed with the patient that this would be difficult given her prior surgeries and the scar tissue that we would have to encounter.  Hopefully she will not need a catheter.  She also understands that she may need to have revision of her fistula which could result in placement of a graft as an interposition graft.  All of her questions were answered.  This is been scheduled on Friday, March 29    [name], MD Vascular and Vein Specialists of [facility] [phone] Pager [phone]

## 2017-04-01 ENCOUNTER — Encounter (HOSPITAL_COMMUNITY): Payer: Self-pay | Admitting: *Deleted

## 2017-04-02 ENCOUNTER — Ambulatory Visit (HOSPITAL_COMMUNITY): Payer: Medicare Other | Admitting: Anesthesiology

## 2017-04-02 ENCOUNTER — Ambulatory Visit (HOSPITAL_COMMUNITY)
Admission: RE | Admit: 2017-04-02 | Discharge: 2017-04-02 | Disposition: A | Discharge status: Home / Self Care | Payer: Medicare Other | Source: Ambulatory Visit | Attending: Surgery | Admitting: Surgery

## 2017-04-02 ENCOUNTER — Encounter (HOSPITAL_COMMUNITY): Payer: Self-pay

## 2017-04-02 ENCOUNTER — Encounter (HOSPITAL_COMMUNITY)
Admission: RE | Disposition: A | Discharge status: Home / Self Care | Payer: Self-pay | Source: Ambulatory Visit | Attending: Surgery

## 2017-04-02 DIAGNOSIS — Z7982 Long term (current) use of aspirin: Secondary | ICD-10-CM

## 2017-04-02 DIAGNOSIS — Z6841 Body Mass Index (BMI) 40.0 and over, adult: Secondary | ICD-10-CM | POA: Insufficient documentation

## 2017-04-02 DIAGNOSIS — T82510A Breakdown (mechanical) of surgically created arteriovenous fistula, initial encounter: Secondary | ICD-10-CM | POA: Diagnosis not present

## 2017-04-02 DIAGNOSIS — Y832 Surgical operation with anastomosis, bypass or graft as the cause of abnormal reaction of the patient, or of later complication, without mention of misadventure at the time of the procedure: Secondary | ICD-10-CM

## 2017-04-02 DIAGNOSIS — N186 End stage renal disease: Secondary | ICD-10-CM | POA: Diagnosis not present

## 2017-04-02 DIAGNOSIS — E7800 Pure hypercholesterolemia, unspecified: Secondary | ICD-10-CM

## 2017-04-02 DIAGNOSIS — Z992 Dependence on renal dialysis: Secondary | ICD-10-CM | POA: Diagnosis not present

## 2017-04-02 DIAGNOSIS — F419 Anxiety disorder, unspecified: Secondary | ICD-10-CM

## 2017-04-02 DIAGNOSIS — M79622 Pain in left upper arm: Secondary | ICD-10-CM | POA: Diagnosis not present

## 2017-04-02 DIAGNOSIS — E1122 Type 2 diabetes mellitus with diabetic chronic kidney disease: Secondary | ICD-10-CM | POA: Insufficient documentation

## 2017-04-02 DIAGNOSIS — K219 Gastro-esophageal reflux disease without esophagitis: Secondary | ICD-10-CM

## 2017-04-02 DIAGNOSIS — T82898A Other specified complication of vascular prosthetic devices, implants and grafts, initial encounter: Secondary | ICD-10-CM | POA: Diagnosis not present

## 2017-04-02 DIAGNOSIS — I120 Hypertensive chronic kidney disease with stage 5 chronic kidney disease or end stage renal disease: Secondary | ICD-10-CM | POA: Insufficient documentation

## 2017-04-02 HISTORY — PX: FISTULA SUPERFICIALIZATION: SHX6341

## 2017-04-02 HISTORY — DX: End stage renal disease: N18.6

## 2017-04-02 HISTORY — DX: Gout, unspecified: M10.9

## 2017-04-02 LAB — POCT I-STAT 4, (NA,K, GLUC, HGB,HCT)
Glucose, Bld: 86 mg/dL (ref 65–99)
HCT: 35 % — ABNORMAL LOW (ref 36.0–46.0)
Hemoglobin: 11.9 g/dL — ABNORMAL LOW (ref 12.0–15.0)
Potassium: 5.1 mmol/L (ref 3.5–5.1)
Sodium: 139 mmol/L (ref 135–145)

## 2017-04-02 LAB — HEMOGLOBIN A1C
HEMOGLOBIN A1C: 5.4 % (ref 4.8–5.6)
MEAN PLASMA GLUCOSE: 108.28 mg/dL

## 2017-04-02 LAB — GLUCOSE, CAPILLARY: GLUCOSE-CAPILLARY: 88 mg/dL (ref 65–99)

## 2017-04-02 SURGERY — FISTULA SUPERFICIALIZATION
Anesthesia: General | Site: Arm Upper | Laterality: Left

## 2017-04-02 MED ORDER — OXYCODONE-ACETAMINOPHEN 5-325 MG PO TABS: 1.0000 | ORAL_TABLET | Freq: Four times a day (QID) | ORAL | 0 refills | Status: DC | PRN

## 2017-04-02 MED ORDER — LIDOCAINE HCL (CARDIAC) 20 MG/ML IV SOLN
INTRAVENOUS | Status: AC
  Filled 2017-04-02: qty 5

## 2017-04-02 MED ORDER — EPHEDRINE 5 MG/ML INJ
INTRAVENOUS | Status: AC
Start: 2017-04-02 — End: ?
  Filled 2017-04-02: qty 10

## 2017-04-02 MED ORDER — ONDANSETRON HCL 4 MG/2ML IJ SOLN: 4.0000 mg | Freq: Once | INTRAMUSCULAR | Status: DC | PRN

## 2017-04-02 MED ORDER — OXYCODONE HCL 5 MG PO TABS
ORAL_TABLET | ORAL | Status: AC
  Administered 2017-04-02: 5 mg
  Filled 2017-04-02: qty 1

## 2017-04-02 MED ORDER — LIDOCAINE HCL (CARDIAC) 20 MG/ML IV SOLN
INTRAVENOUS | Status: AC
  Filled 2017-04-02: qty 15

## 2017-04-02 MED ORDER — CHLORHEXIDINE GLUCONATE 4 % EX LIQD: 60.0000 mL | Freq: Once | CUTANEOUS | Status: DC

## 2017-04-02 MED ORDER — CEFAZOLIN SODIUM-DEXTROSE 2-4 GM/100ML-% IV SOLN
2.0000 g | INTRAVENOUS | Status: AC
  Administered 2017-04-02: 2 g via INTRAVENOUS
  Filled 2017-04-02: qty 100

## 2017-04-02 MED ORDER — SODIUM CHLORIDE 0.9 % IV SOLN: INTRAVENOUS | Status: DC

## 2017-04-02 MED ORDER — ONDANSETRON HCL 4 MG/2ML IJ SOLN
INTRAMUSCULAR | Status: DC | PRN
  Administered 2017-04-02: 4 mg via INTRAVENOUS

## 2017-04-02 MED ORDER — NEOSTIGMINE METHYLSULFATE 10 MG/10ML IV SOLN
INTRAVENOUS | Status: DC | PRN
  Administered 2017-04-02: 3 mg via INTRAVENOUS

## 2017-04-02 MED ORDER — ROCURONIUM BROMIDE 10 MG/ML (PF) SYRINGE
PREFILLED_SYRINGE | INTRAVENOUS | Status: AC
  Filled 2017-04-02: qty 15

## 2017-04-02 MED ORDER — SODIUM CHLORIDE 0.9 % IV SOLN
INTRAVENOUS | Status: AC
  Filled 2017-04-02: qty 1.2

## 2017-04-02 MED ORDER — FENTANYL CITRATE (PF) 100 MCG/2ML IJ SOLN
INTRAMUSCULAR | Status: AC
  Filled 2017-04-02: qty 2

## 2017-04-02 MED ORDER — SODIUM CHLORIDE 0.9 % IV SOLN
INTRAVENOUS | Status: DC
  Administered 2017-04-02 (×2): via INTRAVENOUS

## 2017-04-02 MED ORDER — LIDOCAINE-EPINEPHRINE (PF) 1 %-1:200000 IJ SOLN
INTRAMUSCULAR | Status: DC | PRN
  Administered 2017-04-02: 30 mL

## 2017-04-02 MED ORDER — LIDOCAINE-EPINEPHRINE (PF) 1 %-1:200000 IJ SOLN
INTRAMUSCULAR | Status: AC
  Filled 2017-04-02: qty 30

## 2017-04-02 MED ORDER — PROPOFOL 10 MG/ML IV BOLUS
INTRAVENOUS | Status: AC
Start: 2017-04-02 — End: ?
  Filled 2017-04-02: qty 20

## 2017-04-02 MED ORDER — PROPOFOL 10 MG/ML IV BOLUS
INTRAVENOUS | Status: DC | PRN
  Administered 2017-04-02: 200 mg via INTRAVENOUS
  Administered 2017-04-02: 20 mg via INTRAVENOUS

## 2017-04-02 MED ORDER — SODIUM CHLORIDE 0.9 % IV SOLN
INTRAVENOUS | Status: DC | PRN
  Administered 2017-04-02: 16:00:00

## 2017-04-02 MED ORDER — MIDAZOLAM HCL 5 MG/5ML IJ SOLN
INTRAMUSCULAR | Status: DC | PRN
  Administered 2017-04-02: 2 mg via INTRAVENOUS

## 2017-04-02 MED ORDER — FENTANYL CITRATE (PF) 100 MCG/2ML IJ SOLN
25.0000 ug | INTRAMUSCULAR | Status: DC | PRN
  Administered 2017-04-02 (×2): 50 ug via INTRAVENOUS

## 2017-04-02 MED ORDER — ROCURONIUM BROMIDE 10 MG/ML (PF) SYRINGE
PREFILLED_SYRINGE | INTRAVENOUS | Status: AC
  Filled 2017-04-02: qty 5

## 2017-04-02 MED ORDER — PROPOFOL 500 MG/50ML IV EMUL
INTRAVENOUS | Status: DC | PRN
  Administered 2017-04-02: 50 ug/kg/min via INTRAVENOUS

## 2017-04-02 MED ORDER — GLYCOPYRROLATE 0.2 MG/ML IJ SOLN
INTRAMUSCULAR | Status: DC | PRN
  Administered 2017-04-02: 0.4 mg via INTRAVENOUS

## 2017-04-02 MED ORDER — PHENYLEPHRINE HCL 10 MG/ML IJ SOLN
INTRAVENOUS | Status: DC | PRN
  Administered 2017-04-02: 50 ug/min via INTRAVENOUS

## 2017-04-02 MED ORDER — FENTANYL CITRATE (PF) 250 MCG/5ML IJ SOLN
INTRAMUSCULAR | Status: AC
  Filled 2017-04-02: qty 5

## 2017-04-02 MED ORDER — NEOSTIGMINE METHYLSULFATE 5 MG/5ML IV SOSY
PREFILLED_SYRINGE | INTRAVENOUS | Status: AC
  Filled 2017-04-02: qty 5

## 2017-04-02 MED ORDER — ONDANSETRON HCL 4 MG/2ML IJ SOLN
INTRAMUSCULAR | Status: AC
  Filled 2017-04-02: qty 4

## 2017-04-02 MED ORDER — ROCURONIUM BROMIDE 100 MG/10ML IV SOLN
INTRAVENOUS | Status: DC | PRN
  Administered 2017-04-02: 40 mg via INTRAVENOUS

## 2017-04-02 MED ORDER — FENTANYL CITRATE (PF) 100 MCG/2ML IJ SOLN
INTRAMUSCULAR | Status: DC | PRN
  Administered 2017-04-02 (×3): 50 ug via INTRAVENOUS

## 2017-04-02 MED ORDER — ONDANSETRON HCL 4 MG/2ML IJ SOLN
INTRAMUSCULAR | Status: AC
  Filled 2017-04-02: qty 2

## 2017-04-02 MED ORDER — 0.9 % SODIUM CHLORIDE (POUR BTL) OPTIME
TOPICAL | Status: DC | PRN
  Administered 2017-04-02: 1000 mL

## 2017-04-02 MED ORDER — LIDOCAINE HCL (CARDIAC) 20 MG/ML IV SOLN
INTRAVENOUS | Status: DC | PRN
  Administered 2017-04-02: 80 mg via INTRAVENOUS

## 2017-04-02 MED ORDER — PROPOFOL 10 MG/ML IV BOLUS
INTRAVENOUS | Status: AC
  Filled 2017-04-02: qty 20

## 2017-04-02 MED ORDER — MIDAZOLAM HCL 2 MG/2ML IJ SOLN
INTRAMUSCULAR | Status: AC
  Filled 2017-04-02: qty 2

## 2017-04-02 MED ORDER — PHENYLEPHRINE 40 MCG/ML (10ML) SYRINGE FOR IV PUSH (FOR BLOOD PRESSURE SUPPORT)
PREFILLED_SYRINGE | INTRAVENOUS | Status: AC
Start: 2017-04-02 — End: ?
  Filled 2017-04-02: qty 40

## 2017-04-02 SURGICAL SUPPLY — 35 items
ARMBAND PINK RESTRICT EXTREMIT (MISCELLANEOUS) ×2 IMPLANT
CANISTER SUCT 3000ML PPV (MISCELLANEOUS) ×2 IMPLANT
CLIP VESOCCLUDE MED 6/CT (CLIP) ×2 IMPLANT
CLIP VESOCCLUDE SM WIDE 6/CT (CLIP) ×2 IMPLANT
COVER PROBE W GEL 5X96 (DRAPES) ×2 IMPLANT
DERMABOND ADVANCED (GAUZE/BANDAGES/DRESSINGS) ×3
DERMABOND ADVANCED .7 DNX12 (GAUZE/BANDAGES/DRESSINGS) ×3 IMPLANT
ELECT REM PT RETURN 9FT ADLT (ELECTROSURGICAL) ×2
ELECTRODE REM PT RTRN 9FT ADLT (ELECTROSURGICAL) ×1 IMPLANT
GLOVE BIO SURGEON STRL SZ 6 (GLOVE) ×4 IMPLANT
GLOVE BIO SURGEON STRL SZ7 (GLOVE) ×2 IMPLANT
GLOVE BIOGEL PI IND STRL 6.5 (GLOVE) ×1 IMPLANT
GLOVE BIOGEL PI IND STRL 7.0 (GLOVE) ×2 IMPLANT
GLOVE BIOGEL PI IND STRL 7.5 (GLOVE) ×1 IMPLANT
GLOVE BIOGEL PI INDICATOR 6.5 (GLOVE) ×1
GLOVE BIOGEL PI INDICATOR 7.0 (GLOVE) ×2
GLOVE BIOGEL PI INDICATOR 7.5 (GLOVE) ×1
GLOVE SURG SS PI 7.5 STRL IVOR (GLOVE) ×2 IMPLANT
GOWN STRL REUS W/ TWL LRG LVL3 (GOWN DISPOSABLE) ×2 IMPLANT
GOWN STRL REUS W/ TWL XL LVL3 (GOWN DISPOSABLE) ×1 IMPLANT
GOWN STRL REUS W/TWL LRG LVL3 (GOWN DISPOSABLE) ×2
GOWN STRL REUS W/TWL XL LVL3 (GOWN DISPOSABLE) ×1
HEMOSTAT SNOW SURGICEL 2X4 (HEMOSTASIS) IMPLANT
KIT BASIN OR (CUSTOM PROCEDURE TRAY) ×2 IMPLANT
KIT TURNOVER KIT B (KITS) ×2 IMPLANT
NS IRRIG 1000ML POUR BTL (IV SOLUTION) ×2 IMPLANT
PACK CV ACCESS (CUSTOM PROCEDURE TRAY) ×2 IMPLANT
PAD ARMBOARD 7.5X6 YLW CONV (MISCELLANEOUS) ×4 IMPLANT
SUT PROLENE 6 0 CC (SUTURE) ×2 IMPLANT
SUT VIC AB 3-0 SH 27 (SUTURE) ×4
SUT VIC AB 3-0 SH 27X BRD (SUTURE) ×4 IMPLANT
SUT VICRYL 4-0 PS2 18IN ABS (SUTURE) IMPLANT
TOWEL GREEN STERILE (TOWEL DISPOSABLE) ×2 IMPLANT
UNDERPAD 30X30 (UNDERPADS AND DIAPERS) ×2 IMPLANT
WATER STERILE IRR 1000ML POUR (IV SOLUTION) ×2 IMPLANT

## 2017-04-02 NOTE — Transfer of Care (Signed)
Immediate Anesthesia Transfer of Care Note  Patient: Patricia [name]  Procedure(s) Performed: FISTULA SUPERFICIALIZATION LEFT ARM (Left Arm Upper)  Patient Location: PACU  Anesthesia Type:General  Level of Consciousness: awake  Airway & Oxygen Therapy: Patient Spontanous Breathing and Patient connected to face mask oxygen  Post-op Assessment: Report given to RN and Post -op Vital signs reviewed and stable  Post vital signs: Reviewed and stable  Last Vitals:  Vitals Value Taken Time  BP 102/54 04/02/2017  4:51 PM  Temp    Pulse 75 04/02/2017  4:55 PM  Resp 28 04/02/2017  4:55 PM  SpO2 96 % 04/02/2017  4:55 PM  Vitals shown include unvalidated device data.  Last Pain:  Vitals:   04/02/17 1650  TempSrc:   PainSc: (P) Asleep         Complications: No apparent anesthesia complications

## 2017-04-02 NOTE — Interval H&P Note (Signed)
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 After consideration of risks, benefits and other options for treatment, the patient has consented to  Procedure(s): FISTULA SUPERFICIALIZATION LEFT ARM (Left) as a surgical intervention .  The patient's history has been reviewed, patient examined, no change in status, stable for surgery.  I have reviewed the patient's chart and labs.  Questions were answered to the patient's satisfaction.     [name]

## 2017-04-02 NOTE — Anesthesia Postprocedure Evaluation (Signed)
Anesthesia Post Note  Patient: Patricia [name]  Procedure(s) Performed: FISTULA SUPERFICIALIZATION LEFT ARM (Left Arm Upper)     Patient location during evaluation: PACU Anesthesia Type: General Level of consciousness: awake and alert and oriented Pain management: pain level controlled Vital Signs Assessment: post-procedure vital signs reviewed and stable Respiratory status: spontaneous breathing, nonlabored ventilation and respiratory function stable Cardiovascular status: blood pressure returned to baseline and stable Postop Assessment: no apparent nausea or vomiting Anesthetic complications: no    Last Vitals:  Vitals:   04/02/17 1820 04/02/17 1830  BP:    Pulse:  (!) 58  Resp:  18  Temp: 36.4 C   SpO2:  94%    Last Pain:  Vitals:   04/02/17 1807  TempSrc:   PainSc: 5                  Jahiem Franzoni A.

## 2017-04-02 NOTE — Anesthesia Procedure Notes (Signed)
Procedure Name: Intubation Date/Time: 04/02/2017 3:35 PM Performed by: Lavell Luster, CRNA Pre-anesthesia Checklist: Patient identified, Emergency Drugs available, Suction available, Patient being monitored and Timeout performed Patient Re-evaluated:Patient Re-evaluated prior to induction Oxygen Delivery Method: Circle system utilized Preoxygenation: Pre-oxygenation with 100% oxygen Induction Type: IV induction Ventilation: Mask ventilation without difficulty Laryngoscope Size: Mac and 3 Grade View: Grade I Tube type: Oral Tube size: 7.5 mm Number of attempts: 1 Airway Equipment and Method: Stylet Placement Confirmation: ETT inserted through vocal cords under direct vision,  positive ETCO2 and breath sounds checked- equal and bilateral Secured at: 20 cm Tube secured with: Tape Dental Injury: Teeth and Oropharynx as per pre-operative assessment  Comments: Attempted MAC as posted, pt did not tolerate.  LMA #4 placed with ease, not achieving tidal volumes and leak noted.  DL x 1 with MAC 3 blade, grade I view noted.  Gifford Shave and [facility] verified placement.  Henderson Cloud, CRNA

## 2017-04-02 NOTE — Op Note (Signed)
    [name] MRN: [id number] DOB: Mar 06, 1975 Sex: female  04/02/2017 Pre-operative Diagnosis: poorly functioning left BCF Post-operative diagnosis:  Same Surgeon:  [name] Assistants:  Lennie Muckle Procedure:   Elevation of left brachiocephalic fistula Anesthesia: General Blood Loss:  See anesthesia record Specimens: None  Findings: Dense scar tissue around the fistula.  Indications: The patient has previously undergone patch angioplasty of the fistula as well as elevation.  She has been having difficulty with cannulation.  When I ultrasounded her fistula in the office with the sono site, I felt that it still remained very deep.  Therefore we elected to come back in for repeat elevation  Procedure:  The patient was identified in the holding area and taken to [place] 10  The patient was then placed supine on the table. general anesthesia was administered.  The patient was prepped and draped in the usual sterile fashion.  A time out was called and antibiotics were administered.  2 incisions were made over the fistula.  Of note the fistula was not directly underneath the incision but had then tunneled medial.  Therefore I made 2 separate incisions apart from her previous incisions.  I was able to circumferentially expose her fistula.  It was surrounded by dense scar tissue and fatty tissue.  I was able to get it fully mobilized within these 2 incisions and underneath the skin bridge.  Once this was done I reapproximated the subcutaneous tissue posterior to the fistula with 3-0 Vicryl in interrupted fashion.  The skin was closed directly anterior to the fistula for Vicryl.  The patient did have more of a thrill in her fistula after it had been fully mobilized and therefore I elected not to perform a fistulogram.  She was taken to recovery room in stable condition   Disposition: To PACU stable   V. [name], M.D. Vascular and Vein Specialists of [place] Office:  [phone] Pager:  [phone]

## 2017-04-02 NOTE — Anesthesia Preprocedure Evaluation (Addendum)
Anesthesia Evaluation  Patient identified by MRN, date of birth, ID band Patient awake    Reviewed: Allergy & Precautions, NPO status , Patient's Chart, lab work & pertinent test results, reviewed documented beta blocker date and time   Airway Mallampati: II  TM Distance: >3 FB Neck ROM: Full    Dental  (+) Teeth Intact, Dental Advisory Given   Pulmonary neg pulmonary ROS,    Pulmonary exam normal breath sounds clear to auscultation       Cardiovascular hypertension, Pt. on home beta blockers and Pt. 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  Reproductive/Obstetrics                           Anesthesia Physical Anesthesia Plan  ASA: III  Anesthesia Plan: MAC   Post-op Pain Management:    Induction: Intravenous  PONV Risk Score and Plan: 2 and Propofol infusion and Treatment may vary due to age or medical condition  Airway Management Planned: Natural Airway and Simple Face Mask  Additional Equipment:   Intra-op Plan:   Post-operative Plan:   Informed Consent: I have reviewed the patients History and Physical, chart, labs and discussed the procedure including the risks, benefits and alternatives for the proposed anesthesia with the patient or authorized representative who has indicated his/her understanding and acceptance.   Dental advisory given  Plan Discussed with: CRNA and Anesthesiologist  Anesthesia Plan Comments:          Anesthesia Quick Evaluation

## 2017-04-03 ENCOUNTER — Inpatient Hospital Stay (HOSPITAL_COMMUNITY)
Admission: EM | Admit: 2017-04-03 | Discharge: 2017-04-06 | DRG: 252 | Disposition: A | Discharge status: Home / Self Care | Payer: Medicare Other | Attending: Internal Medicine | Admitting: Internal Medicine

## 2017-04-03 ENCOUNTER — Emergency Department (HOSPITAL_COMMUNITY): Payer: Medicare Other

## 2017-04-03 ENCOUNTER — Encounter (HOSPITAL_COMMUNITY): Payer: Self-pay | Admitting: Surgery

## 2017-04-03 DIAGNOSIS — D631 Anemia in chronic kidney disease: Secondary | ICD-10-CM | POA: Diagnosis present

## 2017-04-03 DIAGNOSIS — K219 Gastro-esophageal reflux disease without esophagitis: Secondary | ICD-10-CM | POA: Diagnosis present

## 2017-04-03 DIAGNOSIS — Z8249 Family history of ischemic heart disease and other diseases of the circulatory system: Secondary | ICD-10-CM

## 2017-04-03 DIAGNOSIS — Z6841 Body Mass Index (BMI) 40.0 and over, adult: Secondary | ICD-10-CM

## 2017-04-03 DIAGNOSIS — Z95828 Presence of other vascular implants and grafts: Secondary | ICD-10-CM

## 2017-04-03 DIAGNOSIS — Z09 Encounter for follow-up examination after completed treatment for conditions other than malignant neoplasm: Secondary | ICD-10-CM

## 2017-04-03 DIAGNOSIS — Z90710 Acquired absence of both cervix and uterus: Secondary | ICD-10-CM

## 2017-04-03 DIAGNOSIS — M109 Gout, unspecified: Secondary | ICD-10-CM | POA: Diagnosis present

## 2017-04-03 DIAGNOSIS — F419 Anxiety disorder, unspecified: Secondary | ICD-10-CM | POA: Diagnosis present

## 2017-04-03 DIAGNOSIS — Z7982 Long term (current) use of aspirin: Secondary | ICD-10-CM

## 2017-04-03 DIAGNOSIS — E785 Hyperlipidemia, unspecified: Secondary | ICD-10-CM | POA: Diagnosis present

## 2017-04-03 DIAGNOSIS — T82510A Breakdown (mechanical) of surgically created arteriovenous fistula, initial encounter: Principal | ICD-10-CM | POA: Diagnosis present

## 2017-04-03 DIAGNOSIS — E1122 Type 2 diabetes mellitus with diabetic chronic kidney disease: Secondary | ICD-10-CM | POA: Diagnosis present

## 2017-04-03 DIAGNOSIS — N186 End stage renal disease: Secondary | ICD-10-CM | POA: Diagnosis present

## 2017-04-03 DIAGNOSIS — N184 Chronic kidney disease, stage 4 (severe): Secondary | ICD-10-CM

## 2017-04-03 DIAGNOSIS — E119 Type 2 diabetes mellitus without complications: Secondary | ICD-10-CM

## 2017-04-03 DIAGNOSIS — Z833 Family history of diabetes mellitus: Secondary | ICD-10-CM

## 2017-04-03 DIAGNOSIS — E7800 Pure hypercholesterolemia, unspecified: Secondary | ICD-10-CM | POA: Diagnosis present

## 2017-04-03 DIAGNOSIS — E8889 Other specified metabolic disorders: Secondary | ICD-10-CM | POA: Diagnosis present

## 2017-04-03 DIAGNOSIS — I120 Hypertensive chronic kidney disease with stage 5 chronic kidney disease or end stage renal disease: Secondary | ICD-10-CM | POA: Diagnosis present

## 2017-04-03 DIAGNOSIS — Y832 Surgical operation with anastomosis, bypass or graft as the cause of abnormal reaction of the patient, or of later complication, without mention of misadventure at the time of the procedure: Secondary | ICD-10-CM | POA: Diagnosis present

## 2017-04-03 DIAGNOSIS — R0602 Shortness of breath: Secondary | ICD-10-CM

## 2017-04-03 DIAGNOSIS — T82590A Other mechanical complication of surgically created arteriovenous fistula, initial encounter: Secondary | ICD-10-CM

## 2017-04-03 DIAGNOSIS — R402142 Coma scale, eyes open, spontaneous, at arrival to emergency department: Secondary | ICD-10-CM | POA: Diagnosis present

## 2017-04-03 DIAGNOSIS — G8918 Other acute postprocedural pain: Secondary | ICD-10-CM

## 2017-04-03 DIAGNOSIS — R402252 Coma scale, best verbal response, oriented, at arrival to emergency department: Secondary | ICD-10-CM | POA: Diagnosis present

## 2017-04-03 DIAGNOSIS — Z992 Dependence on renal dialysis: Secondary | ICD-10-CM | POA: Diagnosis present

## 2017-04-03 DIAGNOSIS — R402362 Coma scale, best motor response, obeys commands, at arrival to emergency department: Secondary | ICD-10-CM | POA: Diagnosis present

## 2017-04-03 DIAGNOSIS — E8770 Fluid overload, unspecified: Secondary | ICD-10-CM | POA: Diagnosis present

## 2017-04-03 DIAGNOSIS — N2581 Secondary hyperparathyroidism of renal origin: Secondary | ICD-10-CM | POA: Diagnosis present

## 2017-04-03 LAB — BASIC METABOLIC PANEL
ANION GAP: 17 — AB (ref 5–15)
BUN: 65 mg/dL — ABNORMAL HIGH (ref 6–20)
CHLORIDE: 95 mmol/L — AB (ref 101–111)
CO2: 24 mmol/L (ref 22–32)
Calcium: 8.2 mg/dL — ABNORMAL LOW (ref 8.9–10.3)
Creatinine, Ser: 12.06 mg/dL — ABNORMAL HIGH (ref 0.44–1.00)
GFR calc Af Amer: 4 mL/min — ABNORMAL LOW (ref >60)
GFR calc non Af Amer: 3 mL/min — ABNORMAL LOW (ref >60)
Glucose, Bld: 100 mg/dL — ABNORMAL HIGH (ref 65–99)
POTASSIUM: 5 mmol/L (ref 3.5–5.1)
SODIUM: 136 mmol/L (ref 135–145)

## 2017-04-03 LAB — CBC
HEMATOCRIT: 30.9 % — AB (ref 36.0–46.0)
HEMOGLOBIN: 9.7 g/dL — AB (ref 12.0–15.0)
MCH: 27.9 pg (ref 26.0–34.0)
MCHC: 31.4 g/dL (ref 30.0–36.0)
MCV: 88.8 fL (ref 78.0–100.0)
Platelets: 229 10*3/uL (ref 150–400)
RBC: 3.48 MIL/uL — AB (ref 3.87–5.11)
RDW: 19.5 % — AB (ref 11.5–15.5)
WBC: 5.7 10*3/uL (ref 4.0–10.5)

## 2017-04-03 LAB — I-STAT BETA HCG BLOOD, ED (MC, WL, AP ONLY): I-stat hCG, quantitative: <5 m[IU]/mL (ref <5)

## 2017-04-03 MED ORDER — HYDROMORPHONE HCL 1 MG/ML IJ SOLN
1.0000 mg | Freq: Once | INTRAMUSCULAR | Status: AC
  Administered 2017-04-03: 1 mg via INTRAVENOUS
  Filled 2017-04-03: qty 1

## 2017-04-03 MED ORDER — ONDANSETRON HCL 4 MG/2ML IJ SOLN
4.0000 mg | Freq: Once | INTRAMUSCULAR | Status: AC
  Administered 2017-04-03: 4 mg via INTRAVENOUS
  Filled 2017-04-03: qty 2

## 2017-04-03 MED ORDER — FENTANYL CITRATE (PF) 100 MCG/2ML IJ SOLN
50.0000 ug | INTRAMUSCULAR | Status: DC | PRN
  Administered 2017-04-03 – 2017-04-04 (×6): 50 ug via INTRAVENOUS
  Filled 2017-04-03 (×3): qty 2

## 2017-04-03 NOTE — ED Provider Notes (Addendum)
[facility] EMERGENCY DEPARTMENT Provider Note   CSN: [id number] Arrival date & time: 04/03/17  2130     History   Chief Complaint Chief Complaint  Patient presents with  . surgical problem  . Dizziness    HPI Patricia [name] is a 42 y.o. female.  Patient presents with complaints of pain and swelling of left arm.  Patient currently undergoes hemodialysis.  She had a revision of her left upper arm fistula yesterday.  Since the surgery she has had progressively increasing pain in her arm.  She reports that the area has been consistently weeping clear fluid and bleeding since the surgery.  Patient reports that she went to dialysis this morning but they were unable to access her, did not get dialysis.  She is not experiencing any fever, chills, chest pain or shortness of breath.  She has noticed some swelling of her legs.     Past Medical History:  Diagnosis Date  . Acid reflux   . Anxiety   . ESRD (end stage renal disease) ([place])    [facility]- [place]  . Gout   . Headache(784.0)    "related to chemo; sometimes weekly" (09/12/2013)  . High cholesterol   . History of blood transfusion "a couple"   "related to low counts"  . Hypertension   . Iron deficiency anemia    "get epogen shots q month" (02/20/2014)  . 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 Type II diabetes mellitus ([place])    "took me off my RX ~ 04/2013"    Patient Active Problem List   Diagnosis Date Noted  . ESRD (end stage renal disease) ([place]) 04/01/2016  . Anemia due to chronic kidney disease 04/01/2016  . GERD (gastroesophageal reflux disease) 04/01/2016  . Hypomagnesemia 04/28/2015  . Acute renal failure superimposed on stage 3 chronic kidney disease ([place]) 04/12/2014  . Atypical chest pain   . Chest pain 02/19/2014  . Hypokalemia 02/19/2014  . Nephrotic syndrome 10/02/2013  . Dyslipidemia 10/02/2013  . PAT (paroxysmal atrial  tachycardia) ([place]) 09/29/2013  . Symptomatic anemia 06/29/2013  . Edema 06/29/2013  . Nausea and vomiting 06/29/2013  . Fever, unspecified 01/31/2013  . Glomerulonephritis 01/28/2013  . Syncope 01/28/2013  . Chest pain with moderate risk of acute coronary syndrome 07/04/2011  . Shortness of breath 07/04/2011  . Morbid obesity ([place]) 07/04/2011  . HTN (hypertension) 07/04/2011  . DM (diabetes mellitus) ([place]) 07/04/2011  . Hypercholesterolemia 07/04/2011    Past Surgical History:  Procedure Laterality Date  . ABDOMINAL HYSTERECTOMY  2010   "laparoscopic"  . ANKLE FRACTURE SURGERY Right 1994  . AV FISTULA PLACEMENT Left 04/14/2016   Procedure: ARTERIOVENOUS (AV) FISTULA CREATION;  Surgeon: Angelia Mould, MD;  Location: [facility];  Service: Vascular;  Laterality: Left;  . CARDIAC CATHETERIZATION  2000's  . ESOPHAGOGASTRODUODENOSCOPY    . FISTULA SUPERFICIALIZATION Left 04/02/2017   Procedure: FISTULA SUPERFICIALIZATION LEFT ARM;  Surgeon: Serafina Mitchell, MD;  Location: [place];  Service: Vascular;  Laterality: Left;  . FISTULOGRAM Left 04/07/2016   Procedure: FISTULOGRAM;  Surgeon: Waynetta Sandy, MD;  Location: [facility];  Service: Vascular;  Laterality: Left;  . FRACTURE SURGERY     right ankle  . INSERTION OF DIALYSIS CATHETER Right 04/07/2016   Procedure: INSERTION OF DIALYSIS CATHETER;  Surgeon: Waynetta Sandy, MD;  Location: [facility];  Service: Vascular;  Laterality: Right;  . LIGATION OF ARTERIOVENOUS  FISTULA Left 04/14/2016   Procedure: LIGATION OF ARTERIOVENOUS  FISTULA;  Surgeon: Angelia Mould, MD;  Location: [facility] [place];  Service: Vascular;  Laterality: Left;  . PATCH ANGIOPLASTY Left 06/19/2016   Procedure: PATCH ANGIOPLASTY LEFT ARTERIOVENOUS FISTULA;  Surgeon: Angelia Mould, MD;  Location: [facility];  Service: Vascular;  Laterality: Left;  . REVISON OF ARTERIOVENOUS FISTULA Left 06/19/2016   Procedure: REVISION SUPERFICIALIZATION OF BRACHIOCEPHALIC  ARTERIOVENOUS FISTULA;  Surgeon: Angelia Mould, MD;  [facility];  Service: Vascular;  Laterality: Left;     OB History    Gravida  3   Para      Term      Preterm      AB      Living        SAB      TAB      Ectopic      Multiple  3   Live Births               Home Medications    Prior to Admission medications   Medication Sig Start Date End Date Taking? Authorizing Provider  allopurinol (ZYLOPRIM) 100 MG tablet Take 100 mg by mouth daily.   Yes [provider]  ALPRAZolam Duanne Moron) 1 MG tablet Take 1 tablet by mouth 2 (two) times daily as needed for anxiety.  05/22/16 05/22/17 Yes [provider]  aspirin 81 MG chewable tablet Chew 1 tablet (81 mg total) by mouth daily. 10/03/13  Yes Eugenie Filler, MD  atorvastatin (LIPITOR) 40 MG tablet Take 40 mg by mouth daily. 03/14/14  Yes [provider]  diphenhydramine-acetaminophen (TYLENOL PM) 25-500 MG TABS tablet Take 2 tablets by mouth at bedtime as needed (for sleep).    Yes [provider]  lisinopril (PRINIVIL,ZESTRIL) 10 MG tablet Take 10 mg by mouth daily.   Yes [provider]  metoprolol tartrate (LOPRESSOR) 25 MG tablet Take 12.5-25 mg by mouth See admin instructions. 12.5 mg on Sun Mon Wed Fri in the mornings ( Days not at Dialysis) 25 mg every evening (Sun - Sat) 04/17/16  Yes [provider]  multivitamin (RENA-VIT) TABS tablet Take 1 tablet by mouth daily.   Yes [provider]  omeprazole (PRILOSEC) 40 MG capsule Take 40 mg by mouth daily.  05/31/16  Yes [provider]  ondansetron (ZOFRAN) 8 MG tablet Take 8 mg by mouth every 8 (eight) hours as needed for nausea/vomiting. 08/28/16  Yes [provider]  oxyCODONE-acetaminophen (PERCOCET/ROXICET) 5-325 MG tablet Take 1 tablet by mouth every 6 (six) hours as needed. Patient taking differently: Take 1 tablet by mouth every 6 (six) hours as needed for moderate pain.   04/02/17  Yes Ulyses Amor, PA-C  senna (SENOKOT) 8.6 MG TABS tablet Take 2 tablets (17.2 mg total) by mouth at bedtime. 04/15/16  Yes Nita Sells, MD  venlafaxine XR (EFFEXOR-XR) 37.5 MG 24 hr capsule Take 37.5 mg by mouth daily with breakfast.   Yes [provider]    Family History Family History  Problem Relation Age of Onset  . Hypertension Mother   . Thyroid disease Mother   . Coronary artery disease Father   . Hypertension Father   . Diabetes Father     Social History Social History   Tobacco Use  . Smoking status: Never Smoker  . Smokeless tobacco: Never Used  Substance Use Topics  . Alcohol use: No  . Drug use: No     Allergies  Nsaids and Tolmetin   Review of Systems Review of Systems  Constitutional: Negative for fever.  Respiratory: Negative for shortness of breath.   Skin: Positive for wound.  All other systems reviewed and are negative.    Physical Exam Updated Vital Signs BP (!) 150/82   Pulse 65   Temp 98.3 F (36.8 C) (Oral)   Resp 16   Ht $R'5\' 7"'yM$  (1.702 m)   Wt 116.1 kg (256 lb)   SpO2 99%   BMI 40.10 kg/m   Physical Exam  Constitutional: She is oriented to person, place, and time. She appears well-developed and well-nourished. No distress.  HENT:  Head: Normocephalic and atraumatic.  Right Ear: Hearing normal.  Left Ear: Hearing normal.  Nose: Nose normal.  Mouth/Throat: Oropharynx is clear and moist and mucous membranes are normal.  Eyes: Pupils are equal, round, and reactive to light. Conjunctivae and EOM are normal.  Neck: Normal range of motion. Neck supple.  Cardiovascular: Regular rhythm, S1 normal and S2 normal. Exam reveals no gallop and no friction rub.  No murmur heard. Pulmonary/Chest: Effort normal and breath sounds normal. No respiratory distress. She exhibits no tenderness.  Abdominal: Soft. Normal appearance and bowel sounds are normal. There is no hepatosplenomegaly. There is no tenderness. There is  no rebound, no guarding, no tenderness at McBurney's point and negative Murphy's sign. No hernia.  Musculoskeletal: Normal range of motion.  Linear incision left upper arm present.  Diffuse serosanguineous drainage with bleeding from the upper and lower portion of the incision.  Area is diffusely tender to the touch, no induration.  No fluctuance.  No significant erythema.  Neurological: She is alert and oriented to person, place, and time. She has normal strength. No cranial nerve deficit or sensory deficit. Coordination normal. GCS eye subscore is 4. GCS verbal subscore is 5. GCS motor subscore is 6.  Skin: Skin is warm and dry. No rash noted. No cyanosis.  Linear incision over left upper arm.  No thrill palpable.  Psychiatric: She has a normal mood and affect. Her speech is normal and behavior is normal. Thought content normal.  Nursing note and vitals reviewed.      ED Treatments / Results  Labs (all labs ordered are listed, but only abnormal results are displayed) Labs Reviewed  BASIC METABOLIC PANEL - Abnormal; Notable for the following components:      Result Value   Chloride 95 (*)    Glucose, Bld 100 (*)    BUN 65 (*)    Creatinine, Ser 12.06 (*)    Calcium 8.2 (*)    GFR calc non Af Amer 3 (*)    GFR calc Af Amer 4 (*)    Anion gap 17 (*)    All other components within normal limits  CBC - Abnormal; Notable for the following components:   RBC 3.48 (*)    Hemoglobin 9.7 (*)    HCT 30.9 (*)    RDW 19.5 (*)    All other components within normal limits  I-STAT BETA HCG BLOOD, ED (MC, WL, AP ONLY)  CBG MONITORING, ED    EKG EKG Interpretation  Date/Time:  Saturday April 03 2017 21:52:02 EDT Ventricular Rate:  75 PR Interval:    QRS Duration: 102 QT Interval:  413 QTC Calculation: 462 R Axis:   53 Text Interpretation:  Sinus rhythm Low voltage, precordial leads ST elev, probable normal early repol pattern Confirmed by Orpah Greek [phone]) on 04/03/2017  11:14:51 PM  Radiology Dg Chest Port 1 View  Result Date: 04/04/2017 CLINICAL DATA:  Surgical site drainage since yesterday. History of end-stage renal disease on dialysis. EXAM: PORTABLE CHEST 1 VIEW COMPARISON:  Chest radiograph May 11, 2016 FINDINGS: Cardiac silhouette is mildly enlarged, mediastinal silhouette is unremarkable for this low inspiratory examination with crowded vasculature markings. The lungs are clear without pleural effusions or focal consolidations. Trachea projects midline and there is no pneumothorax. Included soft tissue planes and osseous structures are non-suspicious. IMPRESSION: Mild cardiomegaly, no acute pulmonary process. Electronically Signed   By: Elon Alas M.D.   On: 04/04/2017 00:19    Procedures Procedures (including critical care time)  Medications Ordered in ED Medications  fentaNYL (SUBLIMAZE) injection 50 mcg (50 mcg Intravenous Given 04/03/17 2226)  HYDROmorphone (DILAUDID) injection 1 mg (1 mg Intravenous Given 04/03/17 2341)  ondansetron (ZOFRAN) injection 4 mg (4 mg Intravenous Given 04/03/17 2343)     Initial Impression / Assessment and Plan / ED Course  I have reviewed the triage vital signs and the nursing notes.  Pertinent labs & imaging results that were available during my care of the patient were reviewed by me and considered in my medical decision making (see chart for details).     Patient presents to the emergency department for evaluation of pain in her left arm.  Patient had surgical procedure on her left upper extremity dialysis fistula yesterday.  Since the surgery she has had progressively worsening pain, drainage of serosanguineous fluid and bleeding.  Patient went to dialysis today but they were unable to cannulate her fistula, sent her home without dialysis.  She does not appear significantly volume overloaded.  Potassium is 5.0.  She is extremely uncomfortable with pain.  I cannot palpate a thrill.  She did have a thrill  present after her procedure yesterday, per op note.  Patient does not need emergent dialysis currently, but I am concerned that she will require dialysis over the weekend, and she does not currently have access.  I discussed this with Dr. Donzetta Matters, on-call for vascular surgery.  Treatment plan was agreed upon to admit the patient to hospitalist service, will be seen by vascular surgery in the morning.  At this point she does not have a fever, white cell elevation.  Examination does not reveal induration, erythema or warmth.  I am not suspicious for infection.  Final Clinical Impressions(s) / ED Diagnoses   Final diagnoses:  Malfunction of arteriovenous dialysis fistula, initial encounter [facility])  Postoperative pain    ED Discharge Orders    None       Kamri Gotsch, Gwenyth Allegra, MD 04/04/17 8185    Orpah Greek, MD 04/04/17 [phone]

## 2017-04-03 NOTE — ED Notes (Signed)
12 in surgical incision at fistula site. Which is actively bleeding slowly with blood and plasma.

## 2017-04-03 NOTE — ED Triage Notes (Signed)
Pt had a surgery yesterday on fistula to raise the vein to access fistula. Pt has a 12 in surgical incision that has been draining blood and plasma and it has not stopped since yesterday. Pt has become dizzy today. Pt has a 10/10 pain at incision site at this time. Pt did not go to dialysis today due to surgery and no access.

## 2017-04-04 ENCOUNTER — Inpatient Hospital Stay (HOSPITAL_COMMUNITY): Payer: Medicare Other | Admitting: Anesthesiology

## 2017-04-04 ENCOUNTER — Encounter (HOSPITAL_COMMUNITY): Payer: Self-pay | Admitting: Internal Medicine

## 2017-04-04 ENCOUNTER — Encounter (HOSPITAL_COMMUNITY)
Admission: EM | Disposition: A | Discharge status: Home / Self Care | Payer: Self-pay | Source: Home / Self Care | Attending: Internal Medicine

## 2017-04-04 ENCOUNTER — Inpatient Hospital Stay (HOSPITAL_COMMUNITY): Payer: Medicare Other

## 2017-04-04 DIAGNOSIS — M109 Gout, unspecified: Secondary | ICD-10-CM | POA: Diagnosis present

## 2017-04-04 DIAGNOSIS — R402362 Coma scale, best motor response, obeys commands, at arrival to emergency department: Secondary | ICD-10-CM | POA: Diagnosis not present

## 2017-04-04 DIAGNOSIS — G8918 Other acute postprocedural pain: Secondary | ICD-10-CM | POA: Diagnosis not present

## 2017-04-04 DIAGNOSIS — Y832 Surgical operation with anastomosis, bypass or graft as the cause of abnormal reaction of the patient, or of later complication, without mention of misadventure at the time of the procedure: Secondary | ICD-10-CM | POA: Diagnosis present

## 2017-04-04 DIAGNOSIS — Z7982 Long term (current) use of aspirin: Secondary | ICD-10-CM | POA: Diagnosis not present

## 2017-04-04 DIAGNOSIS — E7800 Pure hypercholesterolemia, unspecified: Secondary | ICD-10-CM | POA: Diagnosis present

## 2017-04-04 DIAGNOSIS — Z90710 Acquired absence of both cervix and uterus: Secondary | ICD-10-CM | POA: Diagnosis not present

## 2017-04-04 DIAGNOSIS — Z6841 Body Mass Index (BMI) 40.0 and over, adult: Secondary | ICD-10-CM | POA: Diagnosis not present

## 2017-04-04 DIAGNOSIS — Z992 Dependence on renal dialysis: Secondary | ICD-10-CM

## 2017-04-04 DIAGNOSIS — K219 Gastro-esophageal reflux disease without esophagitis: Secondary | ICD-10-CM | POA: Diagnosis present

## 2017-04-04 DIAGNOSIS — M79622 Pain in left upper arm: Secondary | ICD-10-CM | POA: Diagnosis present

## 2017-04-04 DIAGNOSIS — N2581 Secondary hyperparathyroidism of renal origin: Secondary | ICD-10-CM | POA: Diagnosis not present

## 2017-04-04 DIAGNOSIS — I120 Hypertensive chronic kidney disease with stage 5 chronic kidney disease or end stage renal disease: Secondary | ICD-10-CM | POA: Diagnosis not present

## 2017-04-04 DIAGNOSIS — T82590A Other mechanical complication of surgically created arteriovenous fistula, initial encounter: Secondary | ICD-10-CM | POA: Diagnosis present

## 2017-04-04 DIAGNOSIS — E1122 Type 2 diabetes mellitus with diabetic chronic kidney disease: Secondary | ICD-10-CM | POA: Diagnosis present

## 2017-04-04 DIAGNOSIS — M7989 Other specified soft tissue disorders: Secondary | ICD-10-CM | POA: Diagnosis not present

## 2017-04-04 DIAGNOSIS — T82510A Breakdown (mechanical) of surgically created arteriovenous fistula, initial encounter: Secondary | ICD-10-CM | POA: Diagnosis not present

## 2017-04-04 DIAGNOSIS — Z8249 Family history of ischemic heart disease and other diseases of the circulatory system: Secondary | ICD-10-CM | POA: Diagnosis not present

## 2017-04-04 DIAGNOSIS — E8770 Fluid overload, unspecified: Secondary | ICD-10-CM | POA: Diagnosis present

## 2017-04-04 DIAGNOSIS — R402142 Coma scale, eyes open, spontaneous, at arrival to emergency department: Secondary | ICD-10-CM | POA: Diagnosis not present

## 2017-04-04 DIAGNOSIS — E785 Hyperlipidemia, unspecified: Secondary | ICD-10-CM | POA: Diagnosis present

## 2017-04-04 DIAGNOSIS — N184 Chronic kidney disease, stage 4 (severe): Secondary | ICD-10-CM | POA: Diagnosis not present

## 2017-04-04 DIAGNOSIS — M79609 Pain in unspecified limb: Secondary | ICD-10-CM | POA: Diagnosis not present

## 2017-04-04 DIAGNOSIS — F419 Anxiety disorder, unspecified: Secondary | ICD-10-CM | POA: Diagnosis present

## 2017-04-04 DIAGNOSIS — L03114 Cellulitis of left upper limb: Secondary | ICD-10-CM | POA: Diagnosis not present

## 2017-04-04 DIAGNOSIS — Z833 Family history of diabetes mellitus: Secondary | ICD-10-CM | POA: Diagnosis not present

## 2017-04-04 DIAGNOSIS — E8889 Other specified metabolic disorders: Secondary | ICD-10-CM | POA: Diagnosis present

## 2017-04-04 DIAGNOSIS — N186 End stage renal disease: Secondary | ICD-10-CM | POA: Diagnosis not present

## 2017-04-04 DIAGNOSIS — R402252 Coma scale, best verbal response, oriented, at arrival to emergency department: Secondary | ICD-10-CM | POA: Diagnosis not present

## 2017-04-04 DIAGNOSIS — D631 Anemia in chronic kidney disease: Secondary | ICD-10-CM | POA: Diagnosis present

## 2017-04-04 HISTORY — PX: INSERTION OF DIALYSIS CATHETER: SHX1324

## 2017-04-04 LAB — COMPREHENSIVE METABOLIC PANEL
ALBUMIN: 3.4 g/dL — AB (ref 3.5–5.0)
ALT: 6 U/L — ABNORMAL LOW (ref 14–54)
ANION GAP: 20 — AB (ref 5–15)
AST: 28 U/L (ref 15–41)
Alkaline Phosphatase: 47 U/L (ref 38–126)
BILIRUBIN TOTAL: 0.5 mg/dL (ref 0.3–1.2)
BUN: 70 mg/dL — ABNORMAL HIGH (ref 6–20)
CO2: 21 mmol/L — ABNORMAL LOW (ref 22–32)
Calcium: 8.4 mg/dL — ABNORMAL LOW (ref 8.9–10.3)
Chloride: 96 mmol/L — ABNORMAL LOW (ref 101–111)
Creatinine, Ser: 12.88 mg/dL — ABNORMAL HIGH (ref 0.44–1.00)
GFR calc Af Amer: 4 mL/min — ABNORMAL LOW (ref >60)
GFR, EST NON AFRICAN AMERICAN: 3 mL/min — AB (ref >60)
Glucose, Bld: 110 mg/dL — ABNORMAL HIGH (ref 65–99)
POTASSIUM: 5.4 mmol/L — AB (ref 3.5–5.1)
Sodium: 137 mmol/L (ref 135–145)
TOTAL PROTEIN: 6.7 g/dL (ref 6.5–8.1)

## 2017-04-04 LAB — CBG MONITORING, ED: GLUCOSE-CAPILLARY: 102 mg/dL — AB (ref 65–99)

## 2017-04-04 LAB — CBC
HEMATOCRIT: 32.2 % — AB (ref 36.0–46.0)
HEMOGLOBIN: 9.7 g/dL — AB (ref 12.0–15.0)
MCH: 26.7 pg (ref 26.0–34.0)
MCHC: 30.1 g/dL (ref 30.0–36.0)
MCV: 88.7 fL (ref 78.0–100.0)
Platelets: 213 10*3/uL (ref 150–400)
RBC: 3.63 MIL/uL — ABNORMAL LOW (ref 3.87–5.11)
RDW: 19.1 % — ABNORMAL HIGH (ref 11.5–15.5)
WBC: 7 10*3/uL (ref 4.0–10.5)

## 2017-04-04 LAB — GLUCOSE, CAPILLARY
GLUCOSE-CAPILLARY: 99 mg/dL (ref 65–99)
GLUCOSE-CAPILLARY: 111 mg/dL — AB (ref 65–99)
GLUCOSE-CAPILLARY: 133 mg/dL — AB (ref 65–99)
Glucose-Capillary: 136 mg/dL — ABNORMAL HIGH (ref 65–99)

## 2017-04-04 LAB — PHOSPHORUS: Phosphorus: 8.9 mg/dL — ABNORMAL HIGH (ref 2.5–4.6)

## 2017-04-04 SURGERY — INSERTION OF DIALYSIS CATHETER
Anesthesia: General | Site: Chest | Laterality: Right

## 2017-04-04 MED ORDER — ONDANSETRON HCL 4 MG/2ML IJ SOLN
INTRAMUSCULAR | Status: DC | PRN
  Administered 2017-04-04: 4 mg via INTRAVENOUS

## 2017-04-04 MED ORDER — MEPERIDINE HCL 50 MG/ML IJ SOLN: 6.2500 mg | INTRAMUSCULAR | Status: DC | PRN

## 2017-04-04 MED ORDER — SODIUM CHLORIDE 0.9 % IV SOLN
INTRAVENOUS | Status: AC
  Filled 2017-04-04: qty 1.2

## 2017-04-04 MED ORDER — LIDOCAINE HCL (PF) 1 % IJ SOLN
INTRAMUSCULAR | Status: AC
  Filled 2017-04-04: qty 30

## 2017-04-04 MED ORDER — ALLOPURINOL 100 MG PO TABS
100.0000 mg | ORAL_TABLET | Freq: Every day | ORAL | Status: DC
  Administered 2017-04-04 – 2017-04-06 (×3): 100 mg via ORAL
  Filled 2017-04-04 (×3): qty 1

## 2017-04-04 MED ORDER — OXYCODONE-ACETAMINOPHEN 5-325 MG PO TABS
1.0000 | ORAL_TABLET | Freq: Four times a day (QID) | ORAL | Status: DC | PRN
  Administered 2017-04-04 – 2017-04-05 (×4): 1 via ORAL
  Filled 2017-04-04 (×3): qty 1

## 2017-04-04 MED ORDER — PROMETHAZINE HCL 25 MG/ML IJ SOLN: 6.2500 mg | INTRAMUSCULAR | Status: DC | PRN

## 2017-04-04 MED ORDER — VANCOMYCIN HCL IN DEXTROSE 1-5 GM/200ML-% IV SOLN: 1000.0000 mg | INTRAVENOUS | Status: DC

## 2017-04-04 MED ORDER — SODIUM CHLORIDE 0.9% FLUSH: 3.0000 mL | INTRAVENOUS | Status: DC | PRN

## 2017-04-04 MED ORDER — ATORVASTATIN CALCIUM 40 MG PO TABS
40.0000 mg | ORAL_TABLET | Freq: Every day | ORAL | Status: DC
  Administered 2017-04-04 – 2017-04-06 (×3): 40 mg via ORAL
  Filled 2017-04-04 (×3): qty 1

## 2017-04-04 MED ORDER — HYDROMORPHONE HCL 1 MG/ML IJ SOLN
1.0000 mg | Freq: Once | INTRAMUSCULAR | Status: AC
Start: 2017-04-04 — End: 2017-04-04
  Administered 2017-04-04: 1 mg via INTRAVENOUS
  Filled 2017-04-04: qty 1

## 2017-04-04 MED ORDER — METOPROLOL TARTRATE 25 MG PO TABS
25.0000 mg | ORAL_TABLET | Freq: Every day | ORAL | Status: DC
  Administered 2017-04-04 – 2017-04-05 (×2): 25 mg via ORAL
  Filled 2017-04-04 (×2): qty 1

## 2017-04-04 MED ORDER — LACTATED RINGERS IV SOLN: INTRAVENOUS | Status: DC

## 2017-04-04 MED ORDER — FUROSEMIDE 10 MG/ML IJ SOLN
80.0000 mg | Freq: Once | INTRAMUSCULAR | Status: AC
  Administered 2017-04-04: 80 mg via INTRAVENOUS
  Filled 2017-04-04: qty 8

## 2017-04-04 MED ORDER — FENTANYL CITRATE (PF) 100 MCG/2ML IJ SOLN
25.0000 ug | INTRAMUSCULAR | Status: DC | PRN
  Administered 2017-04-04: 25 ug via INTRAVENOUS

## 2017-04-04 MED ORDER — PHENYLEPHRINE HCL 10 MG/ML IJ SOLN
INTRAMUSCULAR | Status: DC | PRN
  Administered 2017-04-04: 80 ug via INTRAVENOUS

## 2017-04-04 MED ORDER — DOXERCALCIFEROL 4 MCG/2ML IV SOLN
1.0000 ug | INTRAVENOUS | Status: DC
  Filled 2017-04-04: qty 2

## 2017-04-04 MED ORDER — PROPOFOL 10 MG/ML IV BOLUS
INTRAVENOUS | Status: DC | PRN
  Administered 2017-04-04: 160 mg via INTRAVENOUS
  Administered 2017-04-04: 40 mg via INTRAVENOUS

## 2017-04-04 MED ORDER — INSULIN ASPART 100 UNIT/ML ~~LOC~~ SOLN: 0.0000 [IU] | Freq: Three times a day (TID) | SUBCUTANEOUS | Status: DC

## 2017-04-04 MED ORDER — ASPIRIN 81 MG PO CHEW
81.0000 mg | CHEWABLE_TABLET | Freq: Every day | ORAL | Status: DC
  Administered 2017-04-04 – 2017-04-06 (×3): 81 mg via ORAL
  Filled 2017-04-04 (×3): qty 1

## 2017-04-04 MED ORDER — FENTANYL CITRATE (PF) 250 MCG/5ML IJ SOLN
INTRAMUSCULAR | Status: AC
Start: 2017-04-04 — End: 2017-04-04
  Filled 2017-04-04: qty 5

## 2017-04-04 MED ORDER — ACETAMINOPHEN 650 MG RE SUPP: 650.0000 mg | Freq: Four times a day (QID) | RECTAL | Status: DC | PRN

## 2017-04-04 MED ORDER — VENLAFAXINE HCL ER 37.5 MG PO CP24
37.5000 mg | ORAL_CAPSULE | Freq: Every day | ORAL | Status: DC
  Administered 2017-04-04 – 2017-04-06 (×3): 37.5 mg via ORAL
  Filled 2017-04-04 (×3): qty 1

## 2017-04-04 MED ORDER — SODIUM CHLORIDE 0.9 % IV SOLN: 250.0000 mL | INTRAVENOUS | Status: DC | PRN

## 2017-04-04 MED ORDER — SENNA 8.6 MG PO TABS
2.0000 | ORAL_TABLET | Freq: Every day | ORAL | Status: DC
  Administered 2017-04-04 – 2017-04-05 (×2): 17.2 mg via ORAL
  Filled 2017-04-04 (×2): qty 2

## 2017-04-04 MED ORDER — PIPERACILLIN-TAZOBACTAM 3.375 G IVPB
3.3750 g | Freq: Two times a day (BID) | INTRAVENOUS | Status: DC
  Administered 2017-04-04 – 2017-04-05 (×4): 3.375 g via INTRAVENOUS
  Filled 2017-04-04 (×5): qty 50

## 2017-04-04 MED ORDER — SODIUM CHLORIDE 0.9 % IV SOLN
INTRAVENOUS | Status: DC | PRN
  Administered 2017-04-04 (×2): via INTRAVENOUS

## 2017-04-04 MED ORDER — LIDOCAINE HCL (CARDIAC) 20 MG/ML IV SOLN
INTRAVENOUS | Status: DC | PRN
  Administered 2017-04-04: 50 mg via INTRAVENOUS

## 2017-04-04 MED ORDER — HEPARIN SODIUM (PORCINE) 5000 UNIT/ML IJ SOLN
5000.0000 [IU] | Freq: Three times a day (TID) | INTRAMUSCULAR | Status: DC
  Administered 2017-04-04 – 2017-04-06 (×6): 5000 [IU] via SUBCUTANEOUS
  Filled 2017-04-04 (×6): qty 1

## 2017-04-04 MED ORDER — VANCOMYCIN HCL 10 G IV SOLR
2000.0000 mg | Freq: Once | INTRAVENOUS | Status: AC
  Administered 2017-04-04: 2000 mg via INTRAVENOUS
  Filled 2017-04-04: qty 2000

## 2017-04-04 MED ORDER — LISINOPRIL 10 MG PO TABS
10.0000 mg | ORAL_TABLET | Freq: Every day | ORAL | Status: DC
  Administered 2017-04-04 – 2017-04-06 (×3): 10 mg via ORAL
  Filled 2017-04-04 (×3): qty 1

## 2017-04-04 MED ORDER — HEPARIN SODIUM (PORCINE) 5000 UNIT/ML IJ SOLN
INTRAMUSCULAR | Status: DC | PRN
  Administered 2017-04-04: 500 mL

## 2017-04-04 MED ORDER — MIDAZOLAM HCL 2 MG/2ML IJ SOLN
INTRAMUSCULAR | Status: AC
  Filled 2017-04-04: qty 2

## 2017-04-04 MED ORDER — PANTOPRAZOLE SODIUM 40 MG PO TBEC
40.0000 mg | DELAYED_RELEASE_TABLET | Freq: Every day | ORAL | Status: DC
  Administered 2017-04-04 – 2017-04-06 (×3): 40 mg via ORAL
  Filled 2017-04-04 (×3): qty 1

## 2017-04-04 MED ORDER — SEVELAMER CARBONATE 800 MG PO TABS
3200.0000 mg | ORAL_TABLET | Freq: Three times a day (TID) | ORAL | Status: DC
  Administered 2017-04-04 – 2017-04-06 (×5): 3200 mg via ORAL
  Filled 2017-04-04 (×5): qty 4

## 2017-04-04 MED ORDER — INSULIN ASPART 100 UNIT/ML ~~LOC~~ SOLN
0.0000 [IU] | SUBCUTANEOUS | Status: DC
Start: 2017-04-04 — End: 2017-04-04

## 2017-04-04 MED ORDER — GLYCOPYRROLATE 0.2 MG/ML IJ SOLN
INTRAMUSCULAR | Status: DC | PRN
  Administered 2017-04-04: 0.4 mg via INTRAVENOUS

## 2017-04-04 MED ORDER — METOPROLOL TARTRATE 12.5 MG HALF TABLET
12.5000 mg | ORAL_TABLET | ORAL | Status: DC
  Administered 2017-04-04 – 2017-04-05 (×2): 12.5 mg via ORAL
  Filled 2017-04-04 (×2): qty 1

## 2017-04-04 MED ORDER — HEPARIN SODIUM (PORCINE) 1000 UNIT/ML IJ SOLN
INTRAMUSCULAR | Status: AC
  Filled 2017-04-04: qty 1

## 2017-04-04 MED ORDER — SODIUM CHLORIDE 0.9% FLUSH
3.0000 mL | Freq: Two times a day (BID) | INTRAVENOUS | Status: DC
  Administered 2017-04-04 – 2017-04-06 (×4): 3 mL via INTRAVENOUS

## 2017-04-04 MED ORDER — NEPRO/CARBSTEADY PO LIQD
237.0000 mL | Freq: Two times a day (BID) | ORAL | Status: DC
  Administered 2017-04-06: 237 mL via ORAL
  Filled 2017-04-04 (×7): qty 237

## 2017-04-04 MED ORDER — SUCCINYLCHOLINE CHLORIDE 20 MG/ML IJ SOLN
INTRAMUSCULAR | Status: DC | PRN
  Administered 2017-04-04: 140 mg via INTRAVENOUS

## 2017-04-04 MED ORDER — PROPOFOL 10 MG/ML IV BOLUS
INTRAVENOUS | Status: AC
  Filled 2017-04-04: qty 20

## 2017-04-04 MED ORDER — HEPARIN SODIUM (PORCINE) 1000 UNIT/ML IJ SOLN
INTRAMUSCULAR | Status: DC | PRN
  Administered 2017-04-04: 3000 [IU] via INTRAVENOUS

## 2017-04-04 MED ORDER — NEOSTIGMINE METHYLSULFATE 10 MG/10ML IV SOLN
INTRAVENOUS | Status: DC | PRN
  Administered 2017-04-04: 2 mg via INTRAVENOUS

## 2017-04-04 MED ORDER — DIPHENHYDRAMINE HCL 50 MG/ML IJ SOLN
25.0000 mg | Freq: Four times a day (QID) | INTRAMUSCULAR | Status: DC | PRN
  Administered 2017-04-04 – 2017-04-05 (×2): 25 mg via INTRAVENOUS
  Filled 2017-04-04 (×2): qty 1

## 2017-04-04 MED ORDER — METOPROLOL TARTRATE 12.5 MG HALF TABLET: 12.5000 mg | ORAL_TABLET | ORAL | Status: DC

## 2017-04-04 MED ORDER — ROCURONIUM BROMIDE 100 MG/10ML IV SOLN
INTRAVENOUS | Status: DC | PRN
  Administered 2017-04-04: 20 mg via INTRAVENOUS

## 2017-04-04 MED ORDER — ACETAMINOPHEN 325 MG PO TABS: 650.0000 mg | ORAL_TABLET | Freq: Four times a day (QID) | ORAL | Status: DC | PRN

## 2017-04-04 MED ORDER — RENA-VITE PO TABS
1.0000 | ORAL_TABLET | Freq: Every day | ORAL | Status: DC
  Administered 2017-04-04 – 2017-04-05 (×2): 1 via ORAL
  Filled 2017-04-04 (×2): qty 1

## 2017-04-04 MED ORDER — HYDROMORPHONE HCL 1 MG/ML IJ SOLN
0.5000 mg | INTRAMUSCULAR | Status: DC | PRN
Start: 2017-04-04 — End: 2017-04-06
  Administered 2017-04-04 – 2017-04-06 (×4): 0.5 mg via INTRAVENOUS
  Filled 2017-04-04 (×4): qty 0.5

## 2017-04-04 MED ORDER — ALPRAZOLAM 0.5 MG PO TABS
1.0000 mg | ORAL_TABLET | Freq: Two times a day (BID) | ORAL | Status: DC | PRN
  Administered 2017-04-04 – 2017-04-05 (×2): 1 mg via ORAL
  Filled 2017-04-04 (×2): qty 2

## 2017-04-04 MED ORDER — DIPHENHYDRAMINE HCL 50 MG/ML IJ SOLN
25.0000 mg | Freq: Once | INTRAMUSCULAR | Status: AC
  Administered 2017-04-04: 25 mg via INTRAVENOUS
  Filled 2017-04-04: qty 1

## 2017-04-04 MED ORDER — ONDANSETRON HCL 4 MG/2ML IJ SOLN: 4.0000 mg | Freq: Four times a day (QID) | INTRAMUSCULAR | Status: DC | PRN

## 2017-04-04 MED ORDER — MIDAZOLAM HCL 5 MG/5ML IJ SOLN
INTRAMUSCULAR | Status: DC | PRN
  Administered 2017-04-04: 1 mg via INTRAVENOUS

## 2017-04-04 MED ORDER — FENTANYL CITRATE (PF) 100 MCG/2ML IJ SOLN
INTRAMUSCULAR | Status: AC
  Filled 2017-04-04: qty 2

## 2017-04-04 MED ORDER — EPHEDRINE SULFATE 50 MG/ML IJ SOLN
INTRAMUSCULAR | Status: DC | PRN
  Administered 2017-04-04 (×2): 10 mg via INTRAVENOUS

## 2017-04-04 MED ORDER — DEXAMETHASONE SODIUM PHOSPHATE 10 MG/ML IJ SOLN
INTRAMUSCULAR | Status: DC | PRN
  Administered 2017-04-04: 4 mg via INTRAVENOUS

## 2017-04-04 MED ORDER — 0.9 % SODIUM CHLORIDE (POUR BTL) OPTIME
TOPICAL | Status: DC | PRN
  Administered 2017-04-04: 1000 mL

## 2017-04-04 SURGICAL SUPPLY — 43 items
BAG DECANTER FOR FLEXI CONT (MISCELLANEOUS) ×2 IMPLANT
BIOPATCH RED 1 DISK 7.0 (GAUZE/BANDAGES/DRESSINGS) ×2 IMPLANT
BLADE SURG 11 STRL SS (BLADE) ×2 IMPLANT
CATH PALINDROME RT-P 15FX19CM (CATHETERS) ×2 IMPLANT
CATH PALINDROME RT-P 15FX23CM (CATHETERS) IMPLANT
CATH PALINDROME RT-P 15FX28CM (CATHETERS) IMPLANT
CATH PALINDROME RT-P 15FX55CM (CATHETERS) IMPLANT
COVER PROBE W GEL 5X96 (DRAPES) ×2 IMPLANT
COVER SURGICAL LIGHT HANDLE (MISCELLANEOUS) ×2 IMPLANT
DERMABOND ADVANCED (GAUZE/BANDAGES/DRESSINGS) ×1
DERMABOND ADVANCED .7 DNX12 (GAUZE/BANDAGES/DRESSINGS) ×1 IMPLANT
DRAPE C-ARM 42X72 X-RAY (DRAPES) ×2 IMPLANT
DRAPE CHEST BREAST 15X10 FENES (DRAPES) ×2 IMPLANT
DRSG TEGADERM 4X4.75 (GAUZE/BANDAGES/DRESSINGS) ×2 IMPLANT
GAUZE SPONGE 2X2 8PLY NS (GAUZE/BANDAGES/DRESSINGS) ×2 IMPLANT
GAUZE SPONGE 4X4 16PLY XRAY LF (GAUZE/BANDAGES/DRESSINGS) ×2 IMPLANT
GLOVE BIO SURGEON STRL SZ7.5 (GLOVE) ×2 IMPLANT
GLOVE BIOGEL PI IND STRL 7.5 (GLOVE) ×1 IMPLANT
GLOVE BIOGEL PI INDICATOR 7.5 (GLOVE) ×1
GOWN STRL REUS W/ TWL LRG LVL3 (GOWN DISPOSABLE) ×1 IMPLANT
GOWN STRL REUS W/ TWL XL LVL3 (GOWN DISPOSABLE) ×1 IMPLANT
GOWN STRL REUS W/TWL LRG LVL3 (GOWN DISPOSABLE) ×1
GOWN STRL REUS W/TWL XL LVL3 (GOWN DISPOSABLE) ×1
KIT BASIN OR (CUSTOM PROCEDURE TRAY) ×2 IMPLANT
KIT TURNOVER KIT B (KITS) ×2 IMPLANT
NEEDLE 18GX1X1/2 (RX/OR ONLY) (NEEDLE) ×2 IMPLANT
NEEDLE HYPO 25GX1X1/2 BEV (NEEDLE) ×2 IMPLANT
NEEDLE PERC 18GX7CM (NEEDLE) ×2 IMPLANT
NS IRRIG 1000ML POUR BTL (IV SOLUTION) ×2 IMPLANT
PACK SURGICAL SETUP 50X90 (CUSTOM PROCEDURE TRAY) ×2 IMPLANT
PAD ARMBOARD 7.5X6 YLW CONV (MISCELLANEOUS) ×4 IMPLANT
SOAP 2 % CHG 4 OZ (WOUND CARE) ×2 IMPLANT
SUT ETHILON 3 0 PS 1 (SUTURE) ×2 IMPLANT
SUT MNCRL AB 4-0 PS2 18 (SUTURE) ×2 IMPLANT
SYR 10ML LL (SYRINGE) ×2 IMPLANT
SYR 20CC LL (SYRINGE) ×4 IMPLANT
SYR 5ML LL (SYRINGE) ×2 IMPLANT
SYR CONTROL 10ML LL (SYRINGE) ×2 IMPLANT
TOWEL GREEN STERILE (TOWEL DISPOSABLE) ×2 IMPLANT
TOWEL GREEN STERILE FF (TOWEL DISPOSABLE) ×2 IMPLANT
TOWEL OR 17X26 4PK STRL BLUE (TOWEL DISPOSABLE) ×2 IMPLANT
WATER STERILE IRR 1000ML POUR (IV SOLUTION) ×2 IMPLANT
WIRE J 3MM .035X145CM (WIRE) ×2 IMPLANT

## 2017-04-04 NOTE — Op Note (Signed)
    [name] MRN: [id number] DOB: [birth year] Sex: female  04/04/2017 Pre-operative Diagnosis: esrd Post-operative diagnosis:  Same Surgeon:  Erlene Quan C. Donzetta Matters, MD Procedure Performed: Placement of right IJ 19 cm tunneled dialysis catheter with ultrasound guidance  Indications: 42 year old female with history of end-stage renal disease was previously on dialysis via left upper arm AV fistula.  This fistula was recently superficial eyes and she denies significant swelling of her left arm and was unable to be cannulated on Saturday for dialysis.  She is now indicated for catheter so she can have dialysis today.  Findings: Right IJ was patent and compressible.  This was cannulated and wire was placed into the IVC and a 19 cm catheter was placed terminating in the SVC.   Procedure:  The patient was identified in the holding area and taken to the operating room where she was placed supine on the operating room table and general anesthesia was induced.  She was sterilely prepped and draped in the neck and chest bilaterally given antibiotics and timeout called.  We used ultrasound guidance to evaluate the right IJ which was noted to be large patent and compressible and this was cannulated with 18-gauge needle and a wire was passed fluoroscopically into the IVC.  A skin nick was made at the level of the wire and a counterincision of the chest both of these were the sites of previous incisions.  A tunnel was placed between the 2.  Wire tract was serially dilated and introducer sheath was placed under fluoroscopic guidance.  A 19 cm catheter was then tunneled and introduced into the SVC.  It was then trimmed to size and assembled and flushed with heparinized saline.  Fluoroscopically it terminated in the SVC with smooth curve to the shoulder no kinks.  It was affixed to the skin with 3-0 nylon suture and the skin neck was closed with 4-0 Monocryl on the neck.  There was placed at both sites and a  sterile dressing was placed over the catheter.  Patient was Y from anesthesia having tolerated procedure without immediate complication.  All counts were correct at completion.  EBL 10 cc.   Xochilth Standish C. Donzetta Matters, MD [facility] Office: [phone] Pager: [phone]

## 2017-04-04 NOTE — Progress Notes (Signed)
Patient ID: Patricia [name], female   DOB: 06-26-1975, 42 y.o.   MRN: [id number]                                                                PROGRESS NOTE                                                                                                                                                                                                             Patient Demographics:    Patricia [name], is a 42 y.o. female, DOB - [birth year], RJJ:[id number]  Admit date - 04/03/2017   Admitting Physician Jani Gravel, MD  Outpatient Primary MD for the patient is Long, Caryl Pina, PA-C  LOS - 0  Outpatient Specialists:    Chief Complaint  Patient presents with  . surgical problem  . Dizziness       Brief Narrative  42 y.o. female, w Dm2, Hypertension, ESRD on HD (T, T, S), apparently has malfunctioning AV fistula s/p surgical intervention 3/30 but still not working. Pt states that she was having some bloody drainage from the site of recent intervention as well as arm swelling. .  ED requested admission so that vascular surgery can see the patient in the AM.   In ED,  CXR  IMPRESSION: Mild cardiomegaly, no acute pulmonary process.  Na 136, K 5.0, Bun 65, Creatinine 12.06 Wbc 5.7, Hgb 9.7, Plt 229  Pt will be admitted for malfunctioning of left AV fistula.       Subjective:    Patricia [name] today is s/p new access this am.  Awaiting dialysis.  slight dyspnea.   No headache, No chest pain, No abdominal pain - No Nausea, No new weakness tingling or numbness, No Cough - No Fever   Assessment  & Plan :    Principal Problem:   Dialysis AV fistula malfunction (HCC) Active Problems:   DM (diabetes mellitus) ([place])   ESRD (end stage renal disease) ([place])     Dialysis AV fistula malfunction S/p R IJ 19cm tunnelled dialysis catheter 04/04/2017 Servando Snare) Vascular surgery input appreciated  LUE swelling , ? Slight cellulitis Ultrasound r/o DVT vanco iv, zosyn iv pharmacy to  dose Dilaudid 0.$RemoveBeforeD'5mg'ncUIGQhxdDcrBM$  iv q4h prn severe pain  ESRD on HD, (T, T, S) Nephrology consulted for dialysis today  DM2 fsbs ac and qhs, ISS  Hypertension Cont lisinopril $RemoveBeforeD'10mg'LjMkZUfHNVyQCI$  po qday Cont lopressor    Hyperlipidemia Cont Lipitor $RemoveBefo'40mg'baIDlOAUQeG$  po qhs  Gerd Cont PPI  Gout Cont Allopurinol $RemoveBeforeDE'100mg'vgNlpbdoirjETMM$  po qday  Anxiety Cont Effexor       Code Status : FULL CODE  Family Communication  : d/w patient,   Disposition Plan  : home  Barriers For Discharge :   Consults  :   Vascular surgery Nephrology  Procedures  :  R IJ 19 cm tunnelled dialysis catheter placement 04/04/2017  DVT Prophylaxis  :  Heparin - SCDs   Lab Results  Component Value Date   PLT 229 04/03/2017    Antibiotics  :   Vanco, Zosyn 3/30=>  Anti-infectives (From admission, onward)   Start     Dose/Rate Route Frequency Ordered Stop   04/06/17 1200  vancomycin (VANCOCIN) IVPB 1000 mg/200 mL premix     1,000 mg 200 mL/hr over 60 Minutes Intravenous Every T-Th-Sa (Hemodialysis) 04/04/17 0145     04/04/17 0200  vancomycin (VANCOCIN) 2,000 mg in sodium chloride 0.9 % 500 mL IVPB     2,000 mg 250 mL/hr over 120 Minutes Intravenous  Once 04/04/17 0145 04/04/17 0442   04/04/17 0200  piperacillin-tazobactam (ZOSYN) IVPB 3.375 g     3.375 g 12.5 mL/hr over 240 Minutes Intravenous Every 12 hours 04/04/17 0145          Objective:   Vitals:   04/04/17 0515 04/04/17 0530 04/04/17 0545 04/04/17 0606  BP: (!) 142/79 (!) 160/87 140/83 (!) 153/74  Pulse: 78 73 74 70  Resp: $Remo'19 18 17 15  'BiaoC$ Temp:  97.7 F (36.5 C)  97.9 F (36.6 C)  TempSrc:      SpO2: 97% 100% 95% 99%  Weight:      Height:        Wt Readings from Last 3 Encounters:  04/03/17 116.1 kg (256 lb)  04/02/17 117.5 kg (259 lb)  03/22/17 117.5 kg (259 lb)     Intake/Output Summary (Last 24 hours) at 04/04/2017 0832 Last data filed at 04/04/2017 0600 Gross per 24 hour  Intake 0 ml  Output 5 ml  Net -5 ml     Physical Exam  Awake Alert,  Oriented X 3, No new F.N deficits, Normal affect [place].AT,PERRAL Supple Neck,No JVD, No cervical lymphadenopathy appriciated.  Symmetrical Chest wall movement, Good air movement bilaterally, CTAB RRR,No Gallops,Rubs or new Murmurs, No Parasternal Heave +ve B.Sounds, Abd Soft, No tenderness, No organomegaly appriciated, No rebound - guarding or rigidity. No Cyanosis, Clubbing, No new Rash or bruise  Slight warmth swelling of the left upper ext, slight serosanguinous drainage   Data Review:    CBC Recent Labs  Lab 04/02/17 1232 04/03/17 2201  WBC  --  5.7  HGB 11.9* 9.7*  HCT 35.0* 30.9*  PLT  --  229  MCV  --  88.8  MCH  --  27.9  MCHC  --  31.4  RDW  --  19.5*    Chemistries  Recent Labs  Lab 04/02/17 1232 04/03/17 2201  NA 139 136  K 5.1 5.0  CL  --  95*  CO2  --  24  GLUCOSE 86 100*  BUN  --  65*  CREATININE  --  12.06*  CALCIUM  --  8.2*   ------------------------------------------------------------------------------------------------------------------ No results for input(s): CHOL, HDL, LDLCALC, TRIG, CHOLHDL, LDLDIRECT in the last 72 hours.  Lab Results  Component Value Date   HGBA1C  5.4 04/02/2017   ------------------------------------------------------------------------------------------------------------------ No results for input(s): TSH, T4TOTAL, T3FREE, THYROIDAB in the last 72 hours.  Invalid input(s): FREET3 ------------------------------------------------------------------------------------------------------------------ No results for input(s): VITAMINB12, FOLATE, FERRITIN, TIBC, IRON, RETICCTPCT in the last 72 hours.  Coagulation profile No results for input(s): INR, PROTIME in the last 168 hours.  No results for input(s): DDIMER in the last 72 hours.  Cardiac Enzymes No results for input(s): CKMB, TROPONINI, MYOGLOBIN in the last 168 hours.  Invalid input(s):  CK ------------------------------------------------------------------------------------------------------------------    Component Value Date/Time   BNP 8.2 09/04/2015 2236    Inpatient Medications  Scheduled Meds: . allopurinol  100 mg Oral Daily  . aspirin  81 mg Oral Daily  . atorvastatin  40 mg Oral Daily  . fentaNYL      . heparin  5,000 Units Subcutaneous Q8H  . insulin aspart  0-9 Units Subcutaneous Q4H  . lisinopril  10 mg Oral Daily  . metoprolol tartrate  12.5 mg Oral Once per day on Sun Mon Wed Fri   And  . metoprolol tartrate  25 mg Oral QHS  . multivitamin  1 tablet Oral QHS  . pantoprazole  40 mg Oral Daily  . senna  2 tablet Oral QHS  . sodium chloride flush  3 mL Intravenous Q12H  . venlafaxine XR  37.5 mg Oral Q breakfast   Continuous Infusions: . sodium chloride    . piperacillin-tazobactam (ZOSYN)  IV 3.375 g (04/04/17 0218)  . [START ON 04/06/2017] vancomycin     PRN Meds:.sodium chloride, acetaminophen **OR** acetaminophen, ALPRAZolam, fentaNYL (SUBLIMAZE) injection, ondansetron (ZOFRAN) IV, oxyCODONE-acetaminophen, sodium chloride flush  Micro Results No results found for this or any previous visit (from the past 240 hour(s)).  Radiology Reports X-ray Chest Pa Or Ap  Result Date: 04/04/2017 CLINICAL DATA:  Status post dialysis catheter insertion. EXAM: CHEST  1 VIEW COMPARISON:  04/03/17 FINDINGS: There is a right IJ dialysis catheter with tips projecting over the cavoatrial junction. No pneumothorax visualized. Normal heart size. No pleural effusion or edema. Decreased lung volumes identified. There is mild platelike atelectasis within the right upper lobe and right midlung. IMPRESSION: 1. Right sided dialysis catheter is in place with tip projecting over the cavoatrial junction. No pneumothorax. Electronically Signed   By: Kerby Moors M.D.   On: 04/04/2017 07:41   Dg Chest Port 1 View  Result Date: 04/04/2017 CLINICAL DATA:  Surgical site drainage  since yesterday. History of end-stage renal disease on dialysis. EXAM: PORTABLE CHEST 1 VIEW COMPARISON:  Chest radiograph May 11, 2016 FINDINGS: Cardiac silhouette is mildly enlarged, mediastinal silhouette is unremarkable for this low inspiratory examination with crowded vasculature markings. The lungs are clear without pleural effusions or focal consolidations. Trachea projects midline and there is no pneumothorax. Included soft tissue planes and osseous structures are non-suspicious. IMPRESSION: Mild cardiomegaly, no acute pulmonary process. Electronically Signed   By: Elon Alas M.D.   On: 04/04/2017 00:19   Dg Fluoro Guide Cv Line-no Report  Result Date: 04/04/2017 Fluoroscopy was utilized by the requesting physician.  No radiographic interpretation.    Time Spent in minutes  30   Jani Gravel M.D on 04/04/2017 at 8:32 AM  Between 7am to 7pm - Pager - [phone]  After 7pm go to [email or web address] - password [email or web address]  [facility] -  Office  [phone]

## 2017-04-04 NOTE — Progress Notes (Signed)
Pharmacy Antibiotic Note  Patricia [name] is a 42 y.o. female  with ESRD on HD admitted on 04/03/2017 with LUE swelling/cellulitis.  Pharmacy has been consulted for Vancomycin and Zosyn  dosing.  Plan: Vancomycin 2 g IV now, then 1 g IV after each HD Zosyn 3.375 g IV q12h  F/U plan for HD  Height: $Remove'5\' 7"'ocbrLYv$  (170.2 cm) Weight: 256 lb (116.1 kg) IBW/kg (Calculated) : 61.6  Temp (24hrs), Avg:98.3 F (36.8 C), Min:98.3 F (36.8 C), Max:98.3 F (36.8 C)  Recent Labs  Lab 04/03/17 2201  WBC 5.7  CREATININE 12.06*    Estimated Creatinine Clearance: 7.9 mL/min (A) (by C-G formula based on SCr of 12.06 mg/dL (H)).    Allergies  Allergen Reactions  . Nsaids Other (See Comments)    Cannot take due to Kidney disease  . Tolmetin Other (See Comments)    Cannot take due to Kidney Disease    Caryl Pina 04/04/2017 1:43 AM

## 2017-04-04 NOTE — Consult Note (Addendum)
[facility] Renal Consultation Note    Indication for Consultation:  Management of ESRD/hemodialysis; anemia, hypertension/volume and secondary hyperparathyroidism PCP: Blair Heys [facility]  HPI: Patricia [name] is a 42 y.o. 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 PMH significant for morbid obesity, HTN, DMT2, gout, anemia of chronic kidney disease, SHPT. Patient recently started hemodialysis and is preparing to transition to PD. She is on [facility] transplant list.   Patient has recent superficialization of LUA AV fistula per Dr. Trula Slade 04/02/2017. Patient presented to OP HD clinic for scheduled treatment. Fistula was cannulated but patient was unable to stand pain and signed off HD. She presented to ED early this AM with C/O pain/swelling/bloody drainage from AVF. She was seen by Dr. Donzetta Matters this AM who placed RIJ tunneled dialysis catheter. She has been started on empiric Vanc and zosyn per primary.   Currently she is complaining of chest heaviness and SOB. She is currently 4.1 kg above OP EDW. She also C/O shooting pain in LUA AVF which is currently wrapped in ace wrap. ROS otherwise negative.   Past Medical History:  Diagnosis Date  . Acid reflux   . Anxiety   . ESRD (end stage renal disease) ([place])    TTHSAT- [place]  . Gout   . Headache(784.0)    "related to chemo; sometimes weekly" (09/12/2013)  . High cholesterol   . History of blood transfusion "a couple"   "related to low counts"  . Hypertension   . Iron deficiency anemia    "get epogen shots q month" (02/20/2014)  . 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 Type II diabetes mellitus ([place])    "took me off my RX ~ 04/2013"   Past Surgical History:  Procedure Laterality Date  . ABDOMINAL HYSTERECTOMY  2010   "laparoscopic"  . ANKLE FRACTURE SURGERY Right 1994  . AV FISTULA PLACEMENT Left 04/14/2016   Procedure: ARTERIOVENOUS (AV) FISTULA CREATION;  Surgeon:  Angelia Mould, MD;  Location: [place];  Service: Vascular;  Laterality: Left;  . CARDIAC CATHETERIZATION  2000's  . ESOPHAGOGASTRODUODENOSCOPY    . FISTULA SUPERFICIALIZATION Left 04/02/2017   Procedure: FISTULA SUPERFICIALIZATION LEFT ARM;  Surgeon: Serafina Mitchell, MD;  Location: [place];  Service: Vascular;  Laterality: Left;  . FISTULOGRAM Left 04/07/2016   Procedure: FISTULOGRAM;  Surgeon: Waynetta Sandy, MD;  Location: [place];  Service: Vascular;  Laterality: Left;  . FRACTURE SURGERY     right ankle  . INSERTION OF DIALYSIS CATHETER Right 04/07/2016   Procedure: INSERTION OF DIALYSIS CATHETER;  Surgeon: Waynetta Sandy, MD;  Location: [place];  Service: Vascular;  Laterality: Right;  . LIGATION OF ARTERIOVENOUS  FISTULA Left 04/14/2016   Procedure: LIGATION OF ARTERIOVENOUS  FISTULA;  Surgeon: Angelia Mould, MD;  Location: [place];  Service: Vascular;  Laterality: Left;  . PATCH ANGIOPLASTY Left 06/19/2016   Procedure: PATCH ANGIOPLASTY LEFT ARTERIOVENOUS FISTULA;  Surgeon: Angelia Mould, MD;  Location: [place];  Service: Vascular;  Laterality: Left;  . REVISON OF ARTERIOVENOUS FISTULA Left 06/19/2016   Procedure: REVISION SUPERFICIALIZATION OF BRACHIOCEPHALIC ARTERIOVENOUS FISTULA;  Surgeon: Angelia Mould, MD;  Location[facility];  Service: Vascular;  Laterality: Left;   Family History  Problem Relation Age of Onset  . Hypertension Mother   . Thyroid disease Mother   . Coronary artery disease Father   . Hypertension Father   . Diabetes Father  Social History:  reports that she has never smoked. She has never used smokeless tobacco. She reports that she does not drink alcohol or use drugs. Allergies  Allergen Reactions  . Nsaids Other (See Comments)    Cannot take due to Kidney disease  . Tolmetin Other (See Comments)    Cannot take due to Kidney Disease   Prior to Admission medications   Medication Sig Start Date End Date Taking?  Authorizing Provider  allopurinol (ZYLOPRIM) 100 MG tablet Take 100 mg by mouth daily.   Yes [provider]  ALPRAZolam Duanne Moron) 1 MG tablet Take 1 tablet by mouth 2 (two) times daily as needed for anxiety.  05/22/16 05/22/17 Yes [provider]  aspirin 81 MG chewable tablet Chew 1 tablet (81 mg total) by mouth daily. 10/03/13  Yes Eugenie Filler, MD  atorvastatin (LIPITOR) 40 MG tablet Take 40 mg by mouth daily. 03/14/14  Yes [provider]  diphenhydramine-acetaminophen (TYLENOL PM) 25-500 MG TABS tablet Take 2 tablets by mouth at bedtime as needed (for sleep).    Yes [provider]  lisinopril (PRINIVIL,ZESTRIL) 10 MG tablet Take 10 mg by mouth daily.   Yes [provider]  metoprolol tartrate (LOPRESSOR) 25 MG tablet Take 12.5-25 mg by mouth See admin instructions. 12.5 mg on Sun Mon Wed Fri in the mornings ( Days not at Dialysis) 25 mg every evening (Sun - Sat) 04/17/16  Yes [provider]  multivitamin (RENA-VIT) TABS tablet Take 1 tablet by mouth daily.   Yes [provider]  omeprazole (PRILOSEC) 40 MG capsule Take 40 mg by mouth daily.  05/31/16  Yes [provider]  ondansetron (ZOFRAN) 8 MG tablet Take 8 mg by mouth every 8 (eight) hours as needed for nausea/vomiting. 08/28/16  Yes [provider]  oxyCODONE-acetaminophen (PERCOCET/ROXICET) 5-325 MG tablet Take 1 tablet by mouth every 6 (six) hours as needed. Patient taking differently: Take 1 tablet by mouth every 6 (six) hours as needed for moderate pain.  04/02/17  Yes Ulyses Amor, PA-C  senna (SENOKOT) 8.6 MG TABS tablet Take 2 tablets (17.2 mg total) by mouth at bedtime. 04/15/16  Yes Nita Sells, MD  venlafaxine XR (EFFEXOR-XR) 37.5 MG 24 hr capsule Take 37.5 mg by mouth daily with breakfast.   Yes [provider]   Current Facility-Administered Medications  Medication Dose Route Frequency Provider Last Rate Last Dose  . 0.9 %   sodium chloride infusion  250 mL Intravenous PRN Jani Gravel, MD      . acetaminophen (TYLENOL) tablet 650 mg  650 mg Oral Q6H PRN Jani Gravel, MD       Or  . acetaminophen (TYLENOL) suppository 650 mg  650 mg Rectal Q6H PRN Jani Gravel, MD      . allopurinol (ZYLOPRIM) tablet 100 mg  100 mg Oral Daily Jani Gravel, MD   100 mg at 04/04/17 1108  . ALPRAZolam Duanne Moron) tablet 1 mg  1 mg Oral BID PRN Jani Gravel, MD      . aspirin chewable tablet 81 mg  81 mg Oral Daily Jani Gravel, MD   81 mg at 04/04/17 1108  . atorvastatin (LIPITOR) tablet 40 mg  40 mg Oral Daily Jani Gravel, MD   40 mg at 04/04/17 1107  . fentaNYL (SUBLIMAZE) 100 MCG/2ML injection           . fentaNYL (SUBLIMAZE) injection 50 mcg  50 mcg Intravenous Q2H PRN Jani Gravel, MD   50 mcg at  04/04/17 1255  . heparin injection 5,000 Units  5,000 Units Subcutaneous Q8H Jani Gravel, MD      . HYDROmorphone (DILAUDID) injection 0.5 mg  0.5 mg Intravenous Q4H PRN Jani Gravel, MD      . insulin aspart (novoLOG) injection 0-9 Units  0-9 Units Subcutaneous Q4H Waynetta Sandy, MD      . lisinopril (PRINIVIL,ZESTRIL) tablet 10 mg  10 mg Oral Daily Jani Gravel, MD   10 mg at 04/04/17 1107  . metoprolol tartrate (LOPRESSOR) tablet 12.5 mg  12.5 mg Oral Once per day on Sun Mon Wed Greer Pickerel, MD   12.5 mg at 04/04/17 1253   And  . metoprolol tartrate (LOPRESSOR) tablet 25 mg  25 mg Oral Loma Sousa, MD      . multivitamin (RENA-VIT) tablet 1 tablet  1 tablet Oral QHS Jani Gravel, MD      . ondansetron [facility]) injection 4 mg  4 mg Intravenous Q6H PRN Jani Gravel, MD      . oxyCODONE-acetaminophen (PERCOCET/ROXICET) 5-325 MG per tablet 1 tablet  1 tablet Oral Q6H PRN Jani Gravel, MD   1 tablet at 04/04/17 [phone]  . pantoprazole (PROTONIX) EC tablet 40 mg  40 mg Oral Daily Jani Gravel, MD   40 mg at 04/04/17 1107  . piperacillin-tazobactam (ZOSYN) IVPB 3.375 g  3.375 g Intravenous Q12H Waynetta Sandy, MD 12.5 mL/hr at 04/04/17 1106 3.375 g  at 04/04/17 1106  . senna (SENOKOT) tablet 17.2 mg  2 tablet Oral QHS Jani Gravel, MD      . sevelamer carbonate (RENVELA) tablet 3,200 mg  3,200 mg Oral TID WC Valentina Gu, NP      . sodium chloride flush (NS) 0.9 % injection 3 mL  3 mL Intravenous Q12H Jani Gravel, MD   3 mL at 04/04/17 1110  . sodium chloride flush (NS) 0.9 % injection 3 mL  3 mL Intravenous PRN Jani Gravel, MD      . Derrill Memo ON 04/06/2017] vancomycin (VANCOCIN) IVPB 1000 mg/200 mL premix  1,000 mg Intravenous Q T,Th,Sa-HD Waynetta Sandy, MD      . venlafaxine XR [facility]) 24 hr capsule 37.5 mg  37.5 mg Oral Q breakfast Jani Gravel, MD   37.5 mg at 04/04/17 1254   Labs: Basic Metabolic Panel: Recent Labs  Lab 04/02/17 1232 04/03/17 2201 04/04/17 0819  NA 139 136 137  K 5.1 5.0 5.4*  CL  --  95* 96*  CO2  --  24 21*  GLUCOSE 86 100* 110*  BUN  --  65* 70*  CREATININE  --  12.06* 12.88*  CALCIUM  --  8.2* 8.4*   Liver Function Tests: Recent Labs  Lab 04/04/17 0819  AST 28  ALT 6*  ALKPHOS 47  BILITOT 0.5  PROT 6.7  ALBUMIN 3.4*   No results for input(s): LIPASE, AMYLASE in the last 168 hours. No results for input(s): AMMONIA in the last 168 hours. CBC: Recent Labs  Lab 04/02/17 1232 04/03/17 2201 04/04/17 0819  WBC  --  5.7 7.0  HGB 11.9* 9.7* 9.7*  HCT 35.0* 30.9* 32.2*  MCV  --  88.8 88.7  PLT  --  229 213   Cardiac Enzymes: No results for input(s): CKTOTAL, CKMB, CKMBINDEX, TROPONINI in the last 168 hours. CBG: Recent Labs  Lab 04/02/17 1721 04/04/17 0210 04/04/17 0511 04/04/17 0724 04/04/17 1146  GLUCAP 88 102* 99 111* 133*   Iron Studies: No results for input(s):  IRON, TIBC, TRANSFERRIN, FERRITIN in the last 72 hours. Studies/Results: X-ray Chest Pa Or Ap  Result Date: 04/04/2017 CLINICAL DATA:  Status post dialysis catheter insertion. EXAM: CHEST  1 VIEW COMPARISON:  04/03/17 FINDINGS: There is a right IJ dialysis catheter with tips projecting over the cavoatrial  junction. No pneumothorax visualized. Normal heart size. No pleural effusion or edema. Decreased lung volumes identified. There is mild platelike atelectasis within the right upper lobe and right midlung. IMPRESSION: 1. Right sided dialysis catheter is in place with tip projecting over the cavoatrial junction. No pneumothorax. Electronically Signed   By: Kerby Moors M.D.   On: 04/04/2017 07:41   Dg Chest Port 1 View  Result Date: 04/04/2017 CLINICAL DATA:  Surgical site drainage since yesterday. History of end-stage renal disease on dialysis. EXAM: PORTABLE CHEST 1 VIEW COMPARISON:  Chest radiograph May 11, 2016 FINDINGS: Cardiac silhouette is mildly enlarged, mediastinal silhouette is unremarkable for this low inspiratory examination with crowded vasculature markings. The lungs are clear without pleural effusions or focal consolidations. Trachea projects midline and there is no pneumothorax. Included soft tissue planes and osseous structures are non-suspicious. IMPRESSION: Mild cardiomegaly, no acute pulmonary process. Electronically Signed   By: Elon Alas M.D.   On: 04/04/2017 00:19   Dg Fluoro Guide Cv Line-no Report  Result Date: 04/04/2017 Fluoroscopy was utilized by the requesting physician.  No radiographic interpretation.    ROS: As per HPI otherwise negative.   Physical Exam: Vitals:   04/04/17 0530 04/04/17 0545 04/04/17 0606 04/04/17 1000  BP: (!) 160/87 140/83 (!) 153/74 (!) 156/76  Pulse: 73 74 70 76  Resp: $Remo'18 17 15 18  'GrlYw$ Temp: 97.7 F (36.5 C)  97.9 F (36.6 C) 98 F (36.7 C)  TempSrc:    Oral  SpO2: 100% 95% 99% 98%  Weight:      Height:         General: Well developed, well nourished, in no acute distress. Head: Normocephalic, atraumatic, sclera non-icteric, mucus membranes are moist Neck: Supple. JVD difficult D/T body habitus but appears to be 1/4 to mandible.  Lungs: Bilateral breath sounds with few scattered bibasilar crackles.  Heart: HS distant, S1,S2  RRR Abdomen: Soft, non-tender, non-distended with normoactive bowel sounds. No rebound/guarding. No obvious abdominal masses. M-S:  Strength and tone appear normal for age. Lower extremities: Bilateral 1+ pedal edema, trace edema hands, pre tibial edema.  Neuro: Alert and oriented X 3. Moves all extremities spontaneously. Psych:  Responds to questions appropriately with a normal affect. Dialysis Access: LUA AVF Ace wrap in place, RIJ TDC drsg CDI.   Dialysis Orders: [place] T,Th,S 4 hrs 180 NRe 400/800 112 kg 2.0 K/ 2.0 Ca  -Heparin 6000 units IV TIW -Hectorol 1 mcg IV TIW -Mircera 225 mcg IV q 2 weeks (last dose 03/25/17)   Assessment/Plan: 1.  Swelling/Pain LUA AVF: Recent superficialization of LUA AVF 04/02/2017. Developed pain and swelling post procedure. [place] placed 04/04/17 per Dr. Donzetta Matters. Has been started on Vanc/Zosyn per primary. Will order [facility] on HD today.  2. Volume Overload D/T missed HD-missed HD D/T issues with AVF. CXR on adm OK,now with C/O heaviness breathing, has generalized edema. Wt 116.1 is currently 4.1 kgs above OP EDW.  Will have HD today. Still urinates some-give 80 mg furosemide IV to try to ameliorate SOB until she can have HD.  3.  ESRD -  T,Th, S. HD today and again 04/06/17 to get back on schedule. Plans to start PD soon has upcoming  appt with Dr. Raul Del for PD cath insertion.  4.  Hypertension- Slightly hypertensive. Home meds have been resumed. Will improve with HD.   5.  Anemia  - HGB 9.7. ESA due 04/08/17. Follow HGB.  6.  Metabolic bone disease -  Continue binders/VDRA.  7.  Nutrition - Renal/Carb mod diet, renal vits, nepro 8.  DM-per primary.   Rita H. Owens Shark, NP-C 04/04/2017, 12:57 PM  [facility] Beeper [phone]  Pt seen and examined. Agree with NP A&P. Appreciate Dr. Donzetta Matters placing a RIJ TC to rest the fistula which was just elevated. Pt continues to have pain in the left arm which is wrapped with ACE but there is a decent bruit  noted on auscultation. She is mildly dyspneic; will HD today.  Otelia Santee, MD [facility]

## 2017-04-04 NOTE — Anesthesia Procedure Notes (Signed)
Procedure Name: Intubation Date/Time: 04/04/2017 4:15 AM Performed by: Suzy Bouchard, CRNA Pre-anesthesia Checklist: Patient identified, Emergency Drugs available, Suction available and Patient being monitored Patient Re-evaluated:Patient Re-evaluated prior to induction Oxygen Delivery Method: Circle System Utilized Preoxygenation: Pre-oxygenation with 100% oxygen Induction Type: IV induction Ventilation: Mask ventilation without difficulty Laryngoscope Size: Miller and 2 Grade View: Grade I Tube type: Oral Tube size: 7.5 mm Number of attempts: 1 Airway Equipment and Method: Stylet and Oral airway Placement Confirmation: ETT inserted through vocal cords under direct vision,  positive ETCO2 and breath sounds checked- equal and bilateral Secured at: 22 cm Tube secured with: Tape Dental Injury: Teeth and Oropharynx as per pre-operative assessment

## 2017-04-04 NOTE — Anesthesia Preprocedure Evaluation (Addendum)
Anesthesia Evaluation  Patient identified by MRN, date of birth, ID band Patient awake    Reviewed: Allergy & Precautions, Patient's Chart, lab work & pertinent test results  Airway Mallampati: II  TM Distance: >3 FB Neck ROM: Full    Dental  (+) Dental Advisory Given, Teeth Intact   Pulmonary    breath sounds clear to auscultation       Cardiovascular hypertension,  Rhythm:Regular Rate:Normal     Neuro/Psych  Headaches, Anxiety    GI/Hepatic Neg liver ROS, GERD  ,  Endo/Other  diabetes, Type 2  Renal/GU Dialysis and ESRFRenal disease     Musculoskeletal negative musculoskeletal ROS (+)   Abdominal (+) + obese,   Peds  Hematology  (+) anemia ,   Anesthesia Other Findings   Reproductive/Obstetrics                            Anesthesia Physical Anesthesia Plan  ASA: III  Anesthesia Plan: General   Post-op Pain Management:    Induction: Intravenous  PONV Risk Score and Plan: Ondansetron, Dexamethasone, Treatment may vary due to age or medical condition and Midazolam  Airway Management Planned: Oral ETT  Additional Equipment: None  Intra-op Plan:   Post-operative Plan: Extubation in OR  Informed Consent: I have reviewed the patients History and Physical, chart, labs and discussed the procedure including the risks, benefits and alternatives for the proposed anesthesia with the patient or authorized representative who has indicated his/her understanding and acceptance.   Dental advisory given  Plan Discussed with: CRNA  Anesthesia Plan Comments:         Anesthesia Quick Evaluation

## 2017-04-04 NOTE — Progress Notes (Signed)
  Progress Note    04/04/2017 12:48 PM Day of Surgery  Subjective: Continues to have left upper arm pain and swelling  Vitals:   04/04/17 0606 04/04/17 1000  BP: (!) 153/74 (!) 156/76  Pulse: 70 76  Resp: 15 18  Temp: 97.9 F (36.6 C) 98 F (36.7 C)  SpO2: 99% 98%    Physical Exam: Awake alert and oriented Right chest catheter is in place without hematoma or bruising Left upper arm dressing is clean dry and intact  CBC    Component Value Date/Time   WBC 7.0 04/04/2017 0819   RBC 3.63 (L) 04/04/2017 0819   HGB 9.7 (L) 04/04/2017 0819   HCT 32.2 (L) 04/04/2017 0819   PLT 213 04/04/2017 0819   MCV 88.7 04/04/2017 0819   MCH 26.7 04/04/2017 0819   MCHC 30.1 04/04/2017 0819   RDW 19.1 (H) 04/04/2017 0819   LYMPHSABS 2.2 09/01/2016 2210   MONOABS 0.6 09/01/2016 2210   EOSABS 0.1 09/01/2016 2210   BASOSABS 0.0 09/01/2016 2210    BMET    Component Value Date/Time   NA 137 04/04/2017 0819   K 5.4 (H) 04/04/2017 0819   CL 96 (L) 04/04/2017 0819   CO2 21 (L) 04/04/2017 0819   GLUCOSE 110 (H) 04/04/2017 0819   BUN 70 (H) 04/04/2017 0819   CREATININE 12.88 (H) 04/04/2017 0819   CALCIUM 8.4 (L) 04/04/2017 0819   GFRNONAA 3 (L) 04/04/2017 0819   GFRAA 4 (L) 04/04/2017 0819    INR    Component Value Date/Time   INR 1.04 04/14/2016 0207     Intake/Output Summary (Last 24 hours) at 04/04/2017 1248 Last data filed at 04/04/2017 1100 Gross per 24 hour  Intake 240 ml  Output 5 ml  Net 235 ml     Assessment/plan:  42 y.o. female is s/p recent superficialization of left arm AV fistula.  TDC was placed last night to rest the arm.  Continue elevation of the left arm and gentle compression until the arm heals and the [facility] is okay for use   Duran Ohern C. Donzetta Matters, MD [facility] [place] Office: [phone] Pager: [phone]  04/04/2017 12:48 PM

## 2017-04-04 NOTE — Anesthesia Postprocedure Evaluation (Signed)
Anesthesia Post Note  Patient: Patricia [name]  Procedure(s) Performed: INSERTION OF DIALYSIS CATHETER (Right Chest)     Patient location during evaluation: PACU Anesthesia Type: General Level of consciousness: awake and alert Pain management: pain level controlled Vital Signs Assessment: post-procedure vital signs reviewed and stable Respiratory status: spontaneous breathing, nonlabored ventilation, respiratory function stable and patient connected to nasal cannula oxygen Cardiovascular status: blood pressure returned to baseline and stable Postop Assessment: no apparent nausea or vomiting Anesthetic complications: no    Last Vitals:  Vitals:   04/04/17 0606 04/04/17 1000  BP: (!) 153/74 (!) 156/76  Pulse: 70 76  Resp: 15 18  Temp: 36.6 C 36.7 C  SpO2: 99% 98%    Last Pain:  Vitals:   04/04/17 1000  TempSrc: Oral  PainSc:                  Effie Berkshire

## 2017-04-04 NOTE — Progress Notes (Signed)
*  Preliminary Results* Left upper extremity venous duplex completed. Study was very technically limited and difficult due to depth of vessels and significant pain, swelling, and scar tissue following basilic vein superficialization 04/02/17. Limited B-mode and color Doppler evaluation of the left upper extremity reveals a patent left internal jugular, left subclavian, left basilic, and left radial veins.  Preliminary results discussed with Dr. Donzetta Matters.  04/04/2017 4:08 PM  Maudry Mayhew, BS, RVT, RDCS, RDMS

## 2017-04-04 NOTE — Consult Note (Signed)
ED Consult    Reason for Consult:  Swelling of left arm, need for hd access Referring Physician:  Dr. Betsey Holiday MRN #:  [id number]  History of Present Illness: This is a 42 y.o. female with end-stage renal disease on dialysis via left arm AV fistula.  She had a recent fistulogram which demonstrated patency but was having difficulty with cannulation that was thought secondary to depth.  On Friday she underwent super fistulization procedure and at completion had a very strong thrill and was thought to have adequate cannulation sites for dialysis.  She now returns with significant pain swelling and drainage from her incisions in the left upper extremity.  She went to dialysis today but was unable to have dialysis.  She is now being admitted to the hospitalist for pain control and will need at least temporary dialysis access.  She is planning to transition to peritoneal dialysis on April 19 according to her.  Past Medical History:  Diagnosis Date  . Acid reflux   . Anxiety   . ESRD (end stage renal disease) ([place])    TTHSAT- [place]  . Gout   . Headache(784.0)    "related to chemo; sometimes weekly" (09/12/2013)  . High cholesterol   . History of blood transfusion "a couple"   "related to low counts"  . Hypertension   . Iron deficiency anemia    "get epogen shots q month" (02/20/2014)  . 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 Type II diabetes mellitus ([place])    "took me off my RX ~ 04/2013"    Past Surgical History:  Procedure Laterality Date  . ABDOMINAL HYSTERECTOMY  2010   "laparoscopic"  . ANKLE FRACTURE SURGERY Right 1994  . AV FISTULA PLACEMENT Left 04/14/2016   Procedure: ARTERIOVENOUS (AV) FISTULA CREATION;  Surgeon: Angelia Mould, MD;  Location: [place];  Service: Vascular;  Laterality: Left;  . CARDIAC CATHETERIZATION  2000's  . ESOPHAGOGASTRODUODENOSCOPY    . FISTULA SUPERFICIALIZATION Left 04/02/2017   Procedure: FISTULA SUPERFICIALIZATION LEFT ARM;  Surgeon: Serafina Mitchell, MD;  Location: [place];  Service: Vascular;  Laterality: Left;  . FISTULOGRAM Left 04/07/2016   Procedure: FISTULOGRAM;  Surgeon: Waynetta Sandy, MD;  Location: [place];  Service: Vascular;  Laterality: Left;  . FRACTURE SURGERY     right ankle  . INSERTION OF DIALYSIS CATHETER Right 04/07/2016   Procedure: INSERTION OF DIALYSIS CATHETER;  Surgeon: Waynetta Sandy, MD;  Location: [place];  Service: Vascular;  Laterality: Right;  . LIGATION OF ARTERIOVENOUS  FISTULA Left 04/14/2016   Procedure: LIGATION OF ARTERIOVENOUS  FISTULA;  Surgeon: Angelia Mould, MD;  Location: [place];  Service: Vascular;  Laterality: Left;  . PATCH ANGIOPLASTY Left 06/19/2016   Procedure: PATCH ANGIOPLASTY LEFT ARTERIOVENOUS FISTULA;  Surgeon: Angelia Mould, MD;  Location: [place];  Service: Vascular;  Laterality: Left;  . REVISON OF ARTERIOVENOUS FISTULA Left 06/19/2016   Procedure: REVISION SUPERFICIALIZATION OF BRACHIOCEPHALIC ARTERIOVENOUS FISTULA;  Surgeon: Angelia Mould, MD;  Location: [place];  Service: Vascular;  Laterality: Left;    Allergies  Allergen Reactions  . Nsaids Other (See Comments)    Cannot take due to Kidney disease  . Tolmetin Other (See Comments)    Cannot take due to Kidney Disease    Prior to Admission medications   Medication Sig Start Date End Date Taking? Authorizing Provider  allopurinol (ZYLOPRIM) 100 MG tablet Take 100  mg by mouth daily.   Yes [provider]  ALPRAZolam Duanne Moron) 1 MG tablet Take 1 tablet by mouth 2 (two) times daily as needed for anxiety.  05/22/16 05/22/17 Yes [provider]  aspirin 81 MG chewable tablet Chew 1 tablet (81 mg total) by mouth daily. 10/03/13  Yes Eugenie Filler, MD  atorvastatin (LIPITOR) 40 MG tablet Take 40 mg by mouth daily. 03/14/14  Yes [provider]  diphenhydramine-acetaminophen (TYLENOL PM) 25-500 MG TABS  tablet Take 2 tablets by mouth at bedtime as needed (for sleep).    Yes [provider]  lisinopril (PRINIVIL,ZESTRIL) 10 MG tablet Take 10 mg by mouth daily.   Yes [provider]  metoprolol tartrate (LOPRESSOR) 25 MG tablet Take 12.5-25 mg by mouth See admin instructions. 12.5 mg on Sun Mon Wed Fri in the mornings ( Days not at Dialysis) 25 mg every evening (Sun - Sat) 04/17/16  Yes [provider]  multivitamin (RENA-VIT) TABS tablet Take 1 tablet by mouth daily.   Yes [provider]  omeprazole (PRILOSEC) 40 MG capsule Take 40 mg by mouth daily.  05/31/16  Yes [provider]  ondansetron (ZOFRAN) 8 MG tablet Take 8 mg by mouth every 8 (eight) hours as needed for nausea/vomiting. 08/28/16  Yes [provider]  oxyCODONE-acetaminophen (PERCOCET/ROXICET) 5-325 MG tablet Take 1 tablet by mouth every 6 (six) hours as needed. Patient taking differently: Take 1 tablet by mouth every 6 (six) hours as needed for moderate pain.  04/02/17  Yes Ulyses Amor, PA-C  senna (SENOKOT) 8.6 MG TABS tablet Take 2 tablets (17.2 mg total) by mouth at bedtime. 04/15/16  Yes Nita Sells, MD  venlafaxine XR (EFFEXOR-XR) 37.5 MG 24 hr capsule Take 37.5 mg by mouth daily with breakfast.   Yes [provider]    Social History   Socioeconomic History  . Marital status: Single    Spouse name: Not on file  . Number of children: Not on file  . Years of education: Not on file  . Highest education level: Not on file  Occupational History  . Not on file  Social Needs  . Financial resource strain: Not on file  . Food insecurity:    Worry: Not on file    Inability: Not on file  . Transportation needs:    Medical: Not on file    Non-medical: Not on file  Tobacco Use  . Smoking status: Never Smoker  . Smokeless tobacco: Never Used  Substance and Sexual Activity  . Alcohol use: No  . Drug use: No  . Sexual activity: Not Currently    Birth  control/protection: Surgical  Lifestyle  . Physical activity:    Days per week: Not on file    Minutes per session: Not on file  . Stress: Not on file  Relationships  . Social connections:    Talks on phone: Not on file    Gets together: Not on file    Attends religious service: Not on file    Active member of club or organization: Not on file    Attends meetings of clubs or organizations: Not on file    Relationship status: Not on file  . Intimate partner violence:    Fear of current or ex partner: Not on file    Emotionally abused: Not on file    Physically abused: Not on file    Forced sexual activity: Not on file  Other Topics Concern  . Not  on file  Social History Narrative  . Not on file    Family History  Problem Relation Age of Onset  . Hypertension Mother   . Thyroid disease Mother   . Coronary artery disease Father   . Hypertension Father   . Diabetes Father     ROS: [x]  Positive   [ ]  Negative   [ ]  All sytems reviewed and are negative  Cardiovascular: []  chest pain/pressure []  palpitations []  SOB lying flat []  DOE []  pain in legs while walking []  pain in legs at rest []  pain in legs at night []  non-healing ulcers []  hx of DVT [x]  swelling in arm  Pulmonary: []  productive cough []  asthma/wheezing []  home O2  Neurologic: []  weakness in []  arms []  legs []  numbness in []  arms []  legs []  hx of CVA []  mini stroke [] difficulty speaking or slurred speech []  temporary loss of vision in one eye []  dizziness  Hematologic: []  hx of cancer []  bleeding problems []  problems with blood clotting easily  Endocrine:   [x]  diabetes []  thyroid disease  GI []  vomiting blood []  blood in stool  GU: [x]  CKD/renal failure []  HD--[]  M/W/F or []  T/T/S []  burning with urination []  blood in urine  Psychiatric: []  anxiety []  depression  Musculoskeletal: []  arthritis []  joint pain  Integumentary: []  rashes []  ulcers  Constitutional: []  fever []   chills   Physical Examination  Vitals:   04/04/17 0030 04/04/17 0045  BP: (!) 164/76 (!) 163/67  Pulse: 64 70  Resp: 15 16  Temp:    SpO2: 99% 98%   Body mass index is 40.1 kg/m.  General:  WDWN in NAD HENT: WNL, normocephalic Pulmonary: normal non-labored breathing Cardiac: Palpable left radial pulse Abdomen: soft, NT/ND Extremities: Left arm has significant swelling and some serosanguineous drainage from her incision sites.  She is moderately tender to palpation in the left upper arm.  There is palpable pulsatility this is hard to discern given the pain on palpation. Neurologic: A&O X 3;  SENSATION: normal; MOTOR FUNCTION:  moving all extremities equally. Speech is fluent/normal Psychiatric: Appropriate mood and affect  CBC    Component Value Date/Time   WBC 5.7 04/03/2017 2201   RBC 3.48 (L) 04/03/2017 2201   HGB 9.7 (L) 04/03/2017 2201   HCT 30.9 (L) 04/03/2017 2201   PLT 229 04/03/2017 2201   MCV 88.8 04/03/2017 2201   MCH 27.9 04/03/2017 2201   MCHC 31.4 04/03/2017 2201   RDW 19.5 (H) 04/03/2017 2201   LYMPHSABS 2.2 09/01/2016 2210   MONOABS 0.6 09/01/2016 2210   EOSABS 0.1 09/01/2016 2210   BASOSABS 0.0 09/01/2016 2210    BMET    Component Value Date/Time   NA 136 04/03/2017 2201   K 5.0 04/03/2017 2201   CL 95 (L) 04/03/2017 2201   CO2 24 04/03/2017 2201   GLUCOSE 100 (H) 04/03/2017 2201   BUN 65 (H) 04/03/2017 2201   CREATININE 12.06 (H) 04/03/2017 2201   CALCIUM 8.2 (L) 04/03/2017 2201   GFRNONAA 3 (L) 04/03/2017 2201   GFRAA 4 (L) 04/03/2017 2201    COAGS: Lab Results  Component Value Date   INR 1.04 04/14/2016   INR 0.98 06/18/2015   INR 1.03 01/18/2015   ASSESSMENT/PLAN: This is a 42 y.o. female with end-stage renal disease now status post super fistulization of her left arm fistula.  Unfortunately they cannot cannulate the fistula and she has significant pain and swelling at this time.  She is  now indicated for catheter for temporary  dialysis we will plan to get this placed this morning.  Please keep patient n.p.o.  Erlene Quan C. Donzetta Matters, MD [facility] Office: [phone] Pager: [phone]

## 2017-04-04 NOTE — H&P (Signed)
TRH H&P   Patient Demographics:    Patricia [name], is a 42 y.o. female  MRN: [id number]   DOB - [birth year]  Admit Date - 04/03/2017  Outpatient Primary MD for the patient is Blair Heys, PA-C  Referring MD/NP/PA: Mathis Fare  Outpatient Specialists: [facility]   Patient coming from: home  Chief Complaint  Patient presents with  . surgical problem  . Dizziness      HPI:    Patricia [name]  is a 42 y.o. female, w Dm2, Hypertension, ESRD on HD (T, T, S), apparently has malfunctioning AV fistula s/p surgical intervention 3/30 but still not working. Pt states that she was having some bloody drainage from the site of recent intervention as well as arm swelling. .  ED requested admission so that vascular surgery can see the patient in the AM.   In ED,  CXR  IMPRESSION: Mild cardiomegaly, no acute pulmonary process.  Na 136, K 5.0, Bun 65, Creatinine 12.06 Wbc 5.7, Hgb 9.7, Plt 229  Pt will be admitted for malfunctioning of left AV fistula.      Review of systems:    In addition to the HPI above,  No Fever-chills, No Headache, No changes with Vision or hearing, No problems swallowing food or Liquids, No Chest pain, Cough or Shortness of Breath, No Abdominal pain, No Nausea or Vommitting, Bowel movements are regular, No Blood in stool or Urine, No dysuria, No new skin rashes or bruises, No new joints pains-aches,  No new weakness, tingling, numbness in any extremity, No recent weight gain or loss, No polyuria, polydypsia or polyphagia, No significant Mental Stressors.  A full 10 point Review of Systems was done, except as stated above, all other Review of Systems were negative.   With Past History of the following :    Past Medical History:  Diagnosis Date  . Acid reflux   . Anxiety   . ESRD (end stage renal disease) ([place])    TTHSAT- [place]    . Gout   . Headache(784.0)    "related to chemo; sometimes weekly" (09/12/2013)  . High cholesterol   . History of blood transfusion "a couple"   "related to low counts"  . Hypertension   . Iron deficiency anemia    "get epogen shots q month" (02/20/2014)  . 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 Type II diabetes mellitus ([place])    "took me off my RX ~ 04/2013"      Past Surgical History:  Procedure Laterality Date  . ABDOMINAL HYSTERECTOMY  2010   "laparoscopic"  . ANKLE FRACTURE SURGERY Right 1994  . AV FISTULA PLACEMENT Left 04/14/2016   Procedure: ARTERIOVENOUS (AV) FISTULA CREATION;  Surgeon: Angelia Mould, MD;  Location: [place];  Service: Vascular;  Laterality: Left;  . CARDIAC CATHETERIZATION  2000's  . ESOPHAGOGASTRODUODENOSCOPY    . FISTULA SUPERFICIALIZATION Left 04/02/2017   Procedure: FISTULA SUPERFICIALIZATION LEFT ARM;  Surgeon: Serafina Mitchell, MD;  Location: [place];  Service: Vascular;  Laterality: Left;  . FISTULOGRAM Left 04/07/2016   Procedure: FISTULOGRAM;  Surgeon: Waynetta Sandy, MD;  Location: [place];  Service: Vascular;  Laterality: Left;  . FRACTURE SURGERY     right ankle  . INSERTION OF DIALYSIS CATHETER Right 04/07/2016   Procedure: INSERTION OF DIALYSIS CATHETER;  Surgeon: Waynetta Sandy, MD;  Location: [place];  Service: Vascular;  Laterality: Right;  . LIGATION OF ARTERIOVENOUS  FISTULA Left 04/14/2016   Procedure: LIGATION OF ARTERIOVENOUS  FISTULA;  Surgeon: Angelia Mould, MD;  Location: [place];  Service: Vascular;  Laterality: Left;  . PATCH ANGIOPLASTY Left 06/19/2016   Procedure: PATCH ANGIOPLASTY LEFT ARTERIOVENOUS FISTULA;  Surgeon: Angelia Mould, MD;  Location: [place];  Service: Vascular;  Laterality: Left;  . REVISON OF ARTERIOVENOUS FISTULA Left 06/19/2016   Procedure: REVISION SUPERFICIALIZATION OF BRACHIOCEPHALIC ARTERIOVENOUS FISTULA;  Surgeon:  Angelia Mould, MD;  Location[facility];  Service: Vascular;  Laterality: Left;      Social History:     Social History   Tobacco Use  . Smoking status: Never Smoker  . Smokeless tobacco: Never Used  Substance Use Topics  . Alcohol use: No     Lives - at home  Mobility - walks by self   Family History :     Family History  Problem Relation Age of Onset  . Hypertension Mother   . Thyroid disease Mother   . Coronary artery disease Father   . Hypertension Father   . Diabetes Father       Home Medications:   Prior to Admission medications   Medication Sig Start Date End Date Taking? Authorizing Provider  allopurinol (ZYLOPRIM) 100 MG tablet Take 100 mg by mouth daily.   Yes [provider]  ALPRAZolam Duanne Moron) 1 MG tablet Take 1 tablet by mouth 2 (two) times daily as needed for anxiety.  05/22/16 05/22/17 Yes [provider]  aspirin 81 MG chewable tablet Chew 1 tablet (81 mg total) by mouth daily. 10/03/13  Yes Eugenie Filler, MD  atorvastatin (LIPITOR) 40 MG tablet Take 40 mg by mouth daily. 03/14/14  Yes [provider]  diphenhydramine-acetaminophen (TYLENOL PM) 25-500 MG TABS tablet Take 2 tablets by mouth at bedtime as needed (for sleep).    Yes [provider]  lisinopril (PRINIVIL,ZESTRIL) 10 MG tablet Take 10 mg by mouth daily.   Yes [provider]  metoprolol tartrate (LOPRESSOR) 25 MG tablet Take 12.5-25 mg by mouth See admin instructions. 12.5 mg on Sun Mon Wed Fri in the mornings ( Days not at Dialysis) 25 mg every evening (Sun - Sat) 04/17/16  Yes [provider]  multivitamin (RENA-VIT) TABS tablet Take 1 tablet by mouth daily.   Yes [provider]  omeprazole (PRILOSEC) 40 MG capsule Take 40 mg by mouth daily.  05/31/16  Yes [provider]  ondansetron (ZOFRAN) 8 MG tablet Take 8 mg by mouth every 8 (eight) hours as needed for nausea/vomiting. 08/28/16  Yes [provider]    oxyCODONE-acetaminophen (PERCOCET/ROXICET) 5-325 MG tablet Take 1 tablet by mouth every 6 (six) hours as needed. Patient taking differently: Take 1 tablet by mouth every 6 (six) hours as needed for moderate pain.  04/02/17  Yes Ulyses Amor, PA-C  senna (SENOKOT) 8.6 MG  TABS tablet Take 2 tablets (17.2 mg total) by mouth at bedtime. 04/15/16  Yes Nita Sells, MD  venlafaxine XR (EFFEXOR-XR) 37.5 MG 24 hr capsule Take 37.5 mg by mouth daily with breakfast.   Yes [provider]     Allergies:     Allergies  Allergen Reactions  . Nsaids Other (See Comments)    Cannot take due to Kidney disease  . Tolmetin Other (See Comments)    Cannot take due to Kidney Disease     Physical Exam:   Vitals  Blood pressure (!) 163/67, pulse 70, temperature 98.3 F (36.8 C), temperature source Oral, resp. rate 16, height $RemoveBe'5\' 7"'dfiQcfQWJ$  (1.702 m), weight 116.1 kg (256 lb), SpO2 98 %.   1. General  lying in bed in NAD,   2. Normal affect and insight, Not Suicidal or Homicidal, Awake Alert, Oriented X 3.  3. No F.N deficits, ALL C.Nerves Intact, Strength 5/5 all 4 extremities, Sensation intact all 4 extremities, Plantars down going.  4. Ears and Eyes appear Normal, Conjunctivae clear, PERRLA. Moist Oral Mucosa.  5. Supple Neck, No JVD, No cervical lymphadenopathy appriciated, No Carotid Bruits.  6. Symmetrical Chest wall movement, Good air movement bilaterally, CTAB.  7. RRR, No Gallops, Rubs or Murmurs, No Parasternal Heave.  8. Positive Bowel Sounds, Abdomen Soft, No tenderness, No organomegaly appriciated,No rebound -guarding or rigidity.  9.  No Cyanosis, Normal Skin Turgor, No Skin Rash or Bruise.  10. Good muscle tone,  joints appear normal , no effusions, Normal ROM.  11. No Palpable Lymph Nodes in Neck or Axillae  L brachiocephalic fistula , no thrill, slight warmth and swelling of the left upper ext   Data Review:    CBC Recent Labs  Lab 04/02/17 1232 04/03/17 2201   WBC  --  5.7  HGB 11.9* 9.7*  HCT 35.0* 30.9*  PLT  --  229  MCV  --  88.8  MCH  --  27.9  MCHC  --  31.4  RDW  --  19.5*   ------------------------------------------------------------------------------------------------------------------  Chemistries  Recent Labs  Lab 04/02/17 1232 04/03/17 2201  NA 139 136  K 5.1 5.0  CL  --  95*  CO2  --  24  GLUCOSE 86 100*  BUN  --  65*  CREATININE  --  12.06*  CALCIUM  --  8.2*   ------------------------------------------------------------------------------------------------------------------ estimated creatinine clearance is 7.9 mL/min (A) (by C-G formula based on SCr of 12.06 mg/dL (H)). ------------------------------------------------------------------------------------------------------------------ No results for input(s): TSH, T4TOTAL, T3FREE, THYROIDAB in the last 72 hours.  Invalid input(s): FREET3  Coagulation profile No results for input(s): INR, PROTIME in the last 168 hours. ------------------------------------------------------------------------------------------------------------------- No results for input(s): DDIMER in the last 72 hours. -------------------------------------------------------------------------------------------------------------------  Cardiac Enzymes No results for input(s): CKMB, TROPONINI, MYOGLOBIN in the last 168 hours.  Invalid input(s): CK ------------------------------------------------------------------------------------------------------------------    Component Value Date/Time   BNP 8.2 09/04/2015 2236     ---------------------------------------------------------------------------------------------------------------  Urinalysis    Component Value Date/Time   COLORURINE YELLOW 04/03/2016 1626   APPEARANCEUR HAZY (A) 04/03/2016 1626   LABSPEC 1.012 04/03/2016 1626   PHURINE 6.0 04/03/2016 1626   GLUCOSEU 150 (A) 04/03/2016 1626   HGBUR NEGATIVE 04/03/2016 1626   BILIRUBINUR  NEGATIVE 04/03/2016 1626   KETONESUR NEGATIVE 04/03/2016 1626   PROTEINUR >=300 (A) 04/03/2016 1626   UROBILINOGEN 0.2 09/19/2014 1620   NITRITE NEGATIVE 04/03/2016 1626   LEUKOCYTESUR NEGATIVE 04/03/2016 1626    ----------------------------------------------------------------------------------------------------------------   Imaging Results:  Dg Chest Port 1 View  Result Date: 04/04/2017 CLINICAL DATA:  Surgical site drainage since yesterday. History of end-stage renal disease on dialysis. EXAM: PORTABLE CHEST 1 VIEW COMPARISON:  Chest radiograph May 11, 2016 FINDINGS: Cardiac silhouette is mildly enlarged, mediastinal silhouette is unremarkable for this low inspiratory examination with crowded vasculature markings. The lungs are clear without pleural effusions or focal consolidations. Trachea projects midline and there is no pneumothorax. Included soft tissue planes and osseous structures are non-suspicious. IMPRESSION: Mild cardiomegaly, no acute pulmonary process. Electronically Signed   By: Elon Alas M.D.   On: 04/04/2017 00:19       Assessment & Plan:    Principal Problem:   Dialysis AV fistula malfunction ([place]) Active Problems:   DM (diabetes mellitus) ([place])   ESRD (end stage renal disease) ([place])    Dialysis AV fistula malfunction Pt will probably need h[place] or alternative access Vascular surgery consulted by ED, appreciate input  LUE swelling Ultrasound r/o DVT vanco iv, zosyn iv pharmacy to dose  ESRD on HD, (T, T, S) Please consult nephrology in AM regarding dialysis I don't think she received dialysis on Saturday due to AV fistula malfunction  DM2 fsbs ac and qhs, ISS  Hypertension Cont lisinopril $RemoveBeforeD'10mg'KTCVeuIPNhjNVd$  po qday Cont lopressor    Hyperlipidemia Cont Lipitor $RemoveBefo'40mg'flVoanlidoA$  po qhs  Gerd Cont PPI  Gout Cont Allopurinol $RemoveBeforeDE'100mg'iWLTQzFDcShfFRF$  po qday  Anxiety Cont Effexor    DVT Prophylaxis Heparin   - SCDs  AM Labs Ordered, also please review Full Orders  Family  Communication: Admission, patients condition and plan of care including tests being ordered have been discussed with the patient who indicate understanding and agree with the plan and Code Status.  Code Status FULL CODE  Likely DC to  home  Condition GUARDED    Consults called:   Vascular by ED  Admission status: inpatient  Time spent in minutes : 45   Jani Gravel M.D on 04/04/2017 at 1:00 AM  Between 7am to 7pm - Pager - [phone]  . After 7pm go to [email or web address] - password [facility]  [facility] - Office  [phone]

## 2017-04-04 NOTE — Transfer of Care (Signed)
Immediate Anesthesia Transfer of Care Note  Patient: Patricia [name]  Procedure(s) Performed: INSERTION OF DIALYSIS CATHETER (Right Chest)  Patient Location: PACU  Anesthesia Type:General  Level of Consciousness: awake and alert   Airway & Oxygen Therapy: Patient Spontanous Breathing and Patient connected to nasal cannula oxygen  Post-op Assessment: Report given to RN and Post -op Vital signs reviewed and stable  Post vital signs: Reviewed and stable  Last Vitals:  Vitals Value Taken Time  BP 155/87 04/04/2017  5:10 AM  Temp    Pulse 78 04/04/2017  5:10 AM  Resp 22 04/04/2017  5:10 AM  SpO2 100 % 04/04/2017  5:10 AM  Vitals shown include unvalidated device data.  Last Pain:  Vitals:   04/04/17 0052  TempSrc:   PainSc: 5          Complications: No apparent anesthesia complications

## 2017-04-05 ENCOUNTER — Encounter (HOSPITAL_COMMUNITY): Payer: Self-pay | Admitting: Vascular Surgery

## 2017-04-05 DIAGNOSIS — L03114 Cellulitis of left upper limb: Secondary | ICD-10-CM

## 2017-04-05 DIAGNOSIS — G8918 Other acute postprocedural pain: Secondary | ICD-10-CM

## 2017-04-05 DIAGNOSIS — N184 Chronic kidney disease, stage 4 (severe): Secondary | ICD-10-CM

## 2017-04-05 DIAGNOSIS — T82590A Other mechanical complication of surgically created arteriovenous fistula, initial encounter: Secondary | ICD-10-CM

## 2017-04-05 DIAGNOSIS — N186 End stage renal disease: Secondary | ICD-10-CM

## 2017-04-05 DIAGNOSIS — E1122 Type 2 diabetes mellitus with diabetic chronic kidney disease: Secondary | ICD-10-CM

## 2017-04-05 LAB — CBC
HEMATOCRIT: 28.1 % — AB (ref 36.0–46.0)
Hemoglobin: 9 g/dL — ABNORMAL LOW (ref 12.0–15.0)
MCH: 27.8 pg (ref 26.0–34.0)
MCHC: 32 g/dL (ref 30.0–36.0)
MCV: 86.7 fL (ref 78.0–100.0)
Platelets: 248 10*3/uL (ref 150–400)
RBC: 3.24 MIL/uL — AB (ref 3.87–5.11)
RDW: 19.3 % — AB (ref 11.5–15.5)
WBC: 6.6 10*3/uL (ref 4.0–10.5)

## 2017-04-05 LAB — COMPREHENSIVE METABOLIC PANEL
ALT: <5 U/L — ABNORMAL LOW (ref 14–54)
AST: 28 U/L (ref 15–41)
Albumin: 2.9 g/dL — ABNORMAL LOW (ref 3.5–5.0)
Alkaline Phosphatase: 46 U/L (ref 38–126)
Anion gap: 19 — ABNORMAL HIGH (ref 5–15)
BUN: 84 mg/dL — AB (ref 6–20)
CHLORIDE: 94 mmol/L — AB (ref 101–111)
CO2: 22 mmol/L (ref 22–32)
Calcium: 8.5 mg/dL — ABNORMAL LOW (ref 8.9–10.3)
Creatinine, Ser: 14.37 mg/dL — ABNORMAL HIGH (ref 0.44–1.00)
GFR, EST AFRICAN AMERICAN: 3 mL/min — AB (ref >60)
GFR, EST NON AFRICAN AMERICAN: 3 mL/min — AB (ref >60)
Glucose, Bld: 101 mg/dL — ABNORMAL HIGH (ref 65–99)
POTASSIUM: 5.9 mmol/L — AB (ref 3.5–5.1)
Sodium: 135 mmol/L (ref 135–145)
Total Bilirubin: 0.5 mg/dL (ref 0.3–1.2)
Total Protein: 6.3 g/dL — ABNORMAL LOW (ref 6.5–8.1)

## 2017-04-05 LAB — HIV ANTIBODY (ROUTINE TESTING W REFLEX): HIV SCREEN 4TH GENERATION: NONREACTIVE

## 2017-04-05 LAB — GLUCOSE, CAPILLARY
GLUCOSE-CAPILLARY: 119 mg/dL — AB (ref 65–99)
Glucose-Capillary: 123 mg/dL — ABNORMAL HIGH (ref 65–99)

## 2017-04-05 MED ORDER — VANCOMYCIN HCL IN DEXTROSE 1-5 GM/200ML-% IV SOLN
INTRAVENOUS | Status: AC
  Administered 2017-04-05: 1000 mg via INTRAVENOUS
  Filled 2017-04-05: qty 200

## 2017-04-05 MED ORDER — VANCOMYCIN HCL IN DEXTROSE 1-5 GM/200ML-% IV SOLN
1000.0000 mg | INTRAVENOUS | Status: AC
  Administered 2017-04-05: 1000 mg via INTRAVENOUS

## 2017-04-05 MED ORDER — SODIUM CHLORIDE 0.9 % IV SOLN: 100.0000 mL | INTRAVENOUS | Status: DC | PRN

## 2017-04-05 MED ORDER — PENTAFLUOROPROP-TETRAFLUOROETH EX AERO: 1.0000 "application " | INHALATION_SPRAY | CUTANEOUS | Status: DC | PRN

## 2017-04-05 MED ORDER — ALTEPLASE 2 MG IJ SOLR: 2.0000 mg | Freq: Once | INTRAMUSCULAR | Status: DC | PRN

## 2017-04-05 MED ORDER — LIDOCAINE-PRILOCAINE 2.5-2.5 % EX CREA: 1.0000 "application " | TOPICAL_CREAM | CUTANEOUS | Status: DC | PRN

## 2017-04-05 MED ORDER — HEPARIN SODIUM (PORCINE) 1000 UNIT/ML DIALYSIS: 1000.0000 [IU] | INTRAMUSCULAR | Status: DC | PRN

## 2017-04-05 MED ORDER — HEPARIN SODIUM (PORCINE) 1000 UNIT/ML DIALYSIS: 20.0000 [IU]/kg | INTRAMUSCULAR | Status: DC | PRN

## 2017-04-05 MED ORDER — HEPARIN SODIUM (PORCINE) 1000 UNIT/ML DIALYSIS
2000.0000 [IU] | INTRAMUSCULAR | Status: DC | PRN
  Administered 2017-04-06: 2000 [IU] via INTRAVENOUS_CENTRAL

## 2017-04-05 MED ORDER — MENTHOL 3 MG MT LOZG
1.0000 | LOZENGE | OROMUCOSAL | Status: DC | PRN
  Filled 2017-04-05: qty 9

## 2017-04-05 MED ORDER — OXYCODONE-ACETAMINOPHEN 5-325 MG PO TABS
ORAL_TABLET | ORAL | Status: AC
  Filled 2017-04-05: qty 1

## 2017-04-05 NOTE — Progress Notes (Signed)
Patient ID: Patricia [name], female   DOB: 04-Nov-1975, 42 y.o.   MRN: [id number] Discussed with Dr. Trula Slade and he thinks that patient does not have cellulitis and does not need antibiotics.  Will discontinue antibiotics.

## 2017-04-05 NOTE — Progress Notes (Signed)
[facility] Progress Note  Subjective: hurts all over, on HD  Vitals:   04/04/17 1800 04/04/17 1955 04/05/17 0446 04/05/17 0745  BP: (!) 170/73 (!) 166/64 (!) 156/87 (!) 169/85  Pulse: 75 67 71 66  Resp: $Remo'18 16 16 'HYgoL$ (!) 22  Temp: 97.8 F (36.6 C) 98.6 F (37 C) 98 F (36.7 C) 97.9 F (36.6 C)  TempSrc: Oral   Oral  SpO2: 96% 100% 97% 95%  Weight:    120.4 kg (265 lb 6.9 oz)  Height:        Inpatient medications: . allopurinol  100 mg Oral Daily  . aspirin  81 mg Oral Daily  . atorvastatin  40 mg Oral Daily  . [START ON 04/06/2017] doxercalciferol  1 mcg Intravenous Q T,Th,Sa-HD  . feeding supplement (NEPRO CARB STEADY)  237 mL Oral BID BM  . heparin  5,000 Units Subcutaneous Q8H  . insulin aspart  0-9 Units Subcutaneous TID WC  . lisinopril  10 mg Oral Daily  . metoprolol tartrate  12.5 mg Oral Once per day on Sun Mon Wed Fri   And  . metoprolol tartrate  25 mg Oral QHS  . multivitamin  1 tablet Oral QHS  . pantoprazole  40 mg Oral Daily  . senna  2 tablet Oral QHS  . sevelamer carbonate  3,200 mg Oral TID WC  . sodium chloride flush  3 mL Intravenous Q12H  . venlafaxine XR  37.5 mg Oral Q breakfast   . sodium chloride    . sodium chloride    . sodium chloride    . piperacillin-tazobactam (ZOSYN)  IV Stopped (04/05/17 0150)  . [START ON 04/06/2017] vancomycin     sodium chloride, sodium chloride, sodium chloride, acetaminophen **OR** acetaminophen, ALPRAZolam, alteplase, diphenhydrAMINE, fentaNYL (SUBLIMAZE) injection, heparin, heparin, HYDROmorphone (DILAUDID) injection, ondansetron (ZOFRAN) IV, oxyCODONE-acetaminophen, sodium chloride flush  Exam: Alert, looks uncomfortable, not in distress, lying flat NO jvd Chest clear bilat  RRR no mrg Abd obese soft ntnd Ext left upper arm w dry dressing R chest HD cath tunneled in place NF, ox 3  Dialysis: TTS NW 4h   400/800   112kg  2/2 bath  Hep 6000   - hect 1 ug - mircera 225 q 2 wks, last 3/21       Impression: 1  Left upper arm pain/ swelling: sp recent superficialization AVF on 3/29. Getting IV abx for possible cellulitis 2  Vol excess: still up 8 kg by wts, some LE edema, no resp issues 3  ESRD HD TTS. Planning to start PD soon.  4  HTN on home meds, BP's up get vol down 5  Anemia - esa due 4/4 6  DM per primary     Plan - HD today off schedule. Plan HD again tomorrow if still here   Kelly Splinter MD [place] pager [phone]   04/05/2017, 10:17 AM   Recent Labs  Lab 04/03/17 2201 04/04/17 0819 04/04/17 1511 04/05/17 0437  NA 136 137  --  135  K 5.0 5.4*  --  5.9*  CL 95* 96*  --  94*  CO2 24 21*  --  22  GLUCOSE 100* 110*  --  101*  BUN 65* 70*  --  84*  CREATININE 12.06* 12.88*  --  14.37*  CALCIUM 8.2* 8.4*  --  8.5*  PHOS  --   --  8.9*  --    Recent Labs  Lab 04/04/17 0819 04/05/17 0437  AST 28 28  ALT 6* <5*  ALKPHOS 47 46  BILITOT 0.5 0.5  PROT 6.7 6.3*  ALBUMIN 3.4* 2.9*   Recent Labs  Lab 04/03/17 2201 04/04/17 0819 04/05/17 0437  WBC 5.7 7.0 6.6  HGB 9.7* 9.7* 9.0*  HCT 30.9* 32.2* 28.1*  MCV 88.8 88.7 86.7  PLT 229 213 248   Iron/TIBC/Ferritin/ %Sat    Component Value Date/Time   IRON 63 04/06/2016 1507   TIBC 206 (L) 04/06/2016 1507   FERRITIN 498 (H) 02/20/2014 0144   IRONPCTSAT 31 04/06/2016 1507

## 2017-04-05 NOTE — Progress Notes (Signed)
Tolerated HD trmt well today.  Venous chamber with clots that required system change at 1105.  Art line and half of venous line rinsed back to patient.  She received no heparin today d/t drainage form LUA AVF surgical site.  Cath dressing changed, no drainage noted.

## 2017-04-05 NOTE — Progress Notes (Signed)
Dressing changed in HD, and surgifoam placed over areas that were oozing.   The area remains tender.  There is a palpable thrill in the AVF.  Recommend fistula holiday with the use of her catheter. Keep arm elevated  Change dressing as needed to keep arm dry. IV abx   Patricia [name]

## 2017-04-05 NOTE — Progress Notes (Signed)
Patient ID: Patricia [name], female   DOB: [birth year], 42 y.o.   MRN: [id number]  PROGRESS NOTE    Patricia [name]  SNK:[id number] DOB: 05-31-1975 DOA: 04/03/2017 PCP: Blair Heys, PA-C   Brief Narrative:  42 year old female with history of diabetes mellitus type 2, hypertension, end-stage renal disease on hemodialysis who underwent elevation of her left arm fistula by vascular surgery on 04/02/2017 and presented on 04/04/2017 for pain/arm swelling and bloody drainage from the site of intervention.  Vascular surgery and nephrology were consulted.  She underwent tunneled catheter placement by vascular surgery.  She was started on broad-spectrum antibiotics for probable cellulitis.   Assessment & Plan:   Principal Problem:   Dialysis AV fistula malfunction (HCC) Active Problems:   DM (diabetes mellitus) ([place])   ESRD (end stage renal disease) ([place])  Left upper arm pain/swelling with concern for cellulitis -status post recent superficialization of AV fistula on 04/02/17. -Continue empiric antibiotics.  Follow cultures.  We will follow-up further recommendations from vascular surgery -Pain management.  Extremity elevation  End-stage renal disease on dialysis -Status post tunneled catheter placement by vascular surgery -Currently undergoing dialysis as per nephrology schedule.  Nephrology following  Diabetes mellitus type II -Continue Accu-Cheks with coverage.  Blood sugars controlled  Hypertension -Monitor blood pressure.  Continue lisinopril and Lopressor.  Blood pressure on the lower side  Hyperlipidemia -Continue Lipitor next  GERD -Continue PPI  Gout -Continue allopurinol  Anxiety -Continue Effexor  Morbid obesity -Outpatient follow-up   DVT prophylaxis: Heparin Code Status: Full Family Communication: None at bedside Disposition Plan: Home in 1-2 days once cleared by nephrology and vascular surgery  Consultants: Nephrology and vascular surgery Procedures: Right IJ  tunneled hemodialysis catheter placement on 04/04/2017 by vascular surgery  Antimicrobials: Vancomycin and Zosyn from 04/03/2017 onwards   Subjective: Patient seen and examined at bedside undergoing dialysis.  She states that she hurts all over but her left arm pain is improving.  No overnight fever or vomiting.  Objective: Vitals:   04/05/17 0930 04/05/17 0945 04/05/17 0953 04/05/17 1000  BP: 102/64 (!) 88/51 99/61 (!) 98/56  Pulse: 76 75 74 76  Resp: $Remo'20 20 18 20  'fstNf$ Temp:      TempSrc:      SpO2:      Weight:      Height:        Intake/Output Summary (Last 24 hours) at 04/05/2017 1055 Last data filed at 04/05/2017 0600 Gross per 24 hour  Intake 880 ml  Output 100 ml  Net 780 ml   Filed Weights   04/03/17 2154 04/05/17 0745  Weight: 116.1 kg (256 lb) 120.4 kg (265 lb 6.9 oz)    Examination:  General exam: Appears calm and comfortable.  No distress Respiratory system: Bilateral decreased breath sound at bases Cardiovascular system: S1 & S2 heard, rate controlled  gastrointestinal system: Abdomen is morbidly obese, nondistended, soft and nontender. Normal bowel sounds heard. Central nervous system: Alert and oriented. No focal neurological deficits. Moving extremities Extremities: No cyanosis, clubbing; trace edema  Skin: Left arm dressing present Psychiatry: Judgement and insight appear normal. Mood & affect appropriate.     Data Reviewed: I have personally reviewed following labs and imaging studies  CBC: Recent Labs  Lab 04/02/17 1232 04/03/17 2201 04/04/17 0819 04/05/17 0437  WBC  --  5.7 7.0 6.6  HGB 11.9* 9.7* 9.7* 9.0*  HCT 35.0* 30.9* 32.2* 28.1*  MCV  --  88.8 88.7 86.7  PLT  --  229 213 248  Basic Metabolic Panel: Recent Labs  Lab 04/02/17 1232 04/03/17 2201 04/04/17 0819 04/04/17 1511 04/05/17 0437  NA 139 136 137  --  135  K 5.1 5.0 5.4*  --  5.9*  CL  --  95* 96*  --  94*  CO2  --  24 21*  --  22  GLUCOSE 86 100* 110*  --  101*  BUN  --   65* 70*  --  84*  CREATININE  --  12.06* 12.88*  --  14.37*  CALCIUM  --  8.2* 8.4*  --  8.5*  PHOS  --   --   --  8.9*  --    GFR: Estimated Creatinine Clearance: 6.8 mL/min (A) (by C-G formula based on SCr of 14.37 mg/dL (H)). Liver Function Tests: Recent Labs  Lab 04/04/17 0819 04/05/17 0437  AST 28 28  ALT 6* <5*  ALKPHOS 47 46  BILITOT 0.5 0.5  PROT 6.7 6.3*  ALBUMIN 3.4* 2.9*   No results for input(s): LIPASE, AMYLASE in the last 168 hours. No results for input(s): AMMONIA in the last 168 hours. Coagulation Profile: No results for input(s): INR, PROTIME in the last 168 hours. Cardiac Enzymes: No results for input(s): CKTOTAL, CKMB, CKMBINDEX, TROPONINI in the last 168 hours. BNP (last 3 results) No results for input(s): PROBNP in the last 8760 hours. HbA1C: Recent Labs    04/02/17 1230  HGBA1C 5.4   CBG: Recent Labs  Lab 04/04/17 0210 04/04/17 0511 04/04/17 0724 04/04/17 1146 04/04/17 1953  GLUCAP 102* 99 111* 133* 136*   Lipid Profile: No results for input(s): CHOL, HDL, LDLCALC, TRIG, CHOLHDL, LDLDIRECT in the last 72 hours. Thyroid Function Tests: No results for input(s): TSH, T4TOTAL, FREET4, T3FREE, THYROIDAB in the last 72 hours. Anemia Panel: No results for input(s): VITAMINB12, FOLATE, FERRITIN, TIBC, IRON, RETICCTPCT in the last 72 hours. Sepsis Labs: No results for input(s): PROCALCITON, LATICACIDVEN in the last 168 hours.  No results found for this or any previous visit (from the past 240 hour(s)).       Radiology Studies: X-ray Chest Pa Or Ap  Result Date: 04/04/2017 CLINICAL DATA:  Status post dialysis catheter insertion. EXAM: CHEST  1 VIEW COMPARISON:  04/03/17 FINDINGS: There is a right IJ dialysis catheter with tips projecting over the cavoatrial junction. No pneumothorax visualized. Normal heart size. No pleural effusion or edema. Decreased lung volumes identified. There is mild platelike atelectasis within the right upper lobe and  right midlung. IMPRESSION: 1. Right sided dialysis catheter is in place with tip projecting over the cavoatrial junction. No pneumothorax. Electronically Signed   By: Signa Kell M.D.   On: 04/04/2017 07:41   Dg Chest Port 1 View  Result Date: 04/04/2017 CLINICAL DATA:  Surgical site drainage since yesterday. History of end-stage renal disease on dialysis. EXAM: PORTABLE CHEST 1 VIEW COMPARISON:  Chest radiograph May 11, 2016 FINDINGS: Cardiac silhouette is mildly enlarged, mediastinal silhouette is unremarkable for this low inspiratory examination with crowded vasculature markings. The lungs are clear without pleural effusions or focal consolidations. Trachea projects midline and there is no pneumothorax. Included soft tissue planes and osseous structures are non-suspicious. IMPRESSION: Mild cardiomegaly, no acute pulmonary process. Electronically Signed   By: Awilda Metro M.D.   On: 04/04/2017 00:19   Dg Fluoro Guide Cv Line-no Report  Result Date: 04/04/2017 Fluoroscopy was utilized by the requesting physician.  No radiographic interpretation.        Scheduled Meds: . allopurinol  100  mg Oral Daily  . aspirin  81 mg Oral Daily  . atorvastatin  40 mg Oral Daily  . [START ON 04/06/2017] doxercalciferol  1 mcg Intravenous Q T,Th,Sa-HD  . feeding supplement (NEPRO CARB STEADY)  237 mL Oral BID BM  . heparin  5,000 Units Subcutaneous Q8H  . insulin aspart  0-9 Units Subcutaneous TID WC  . lisinopril  10 mg Oral Daily  . metoprolol tartrate  12.5 mg Oral Once per day on Sun Mon Wed Fri   And  . metoprolol tartrate  25 mg Oral QHS  . multivitamin  1 tablet Oral QHS  . pantoprazole  40 mg Oral Daily  . senna  2 tablet Oral QHS  . sevelamer carbonate  3,200 mg Oral TID WC  . sodium chloride flush  3 mL Intravenous Q12H  . venlafaxine XR  37.5 mg Oral Q breakfast   Continuous Infusions: . sodium chloride    . sodium chloride    . sodium chloride    . piperacillin-tazobactam  (ZOSYN)  IV Stopped (04/05/17 0150)  . [START ON 04/06/2017] vancomycin       LOS: 1 day        Aline August, MD [facility] Pager [phone]  If 7PM-7AM, please contact night-coverage [email or web address] Password [email or web address] 04/05/2017, 10:55 AM

## 2017-04-06 LAB — GLUCOSE, CAPILLARY
GLUCOSE-CAPILLARY: 116 mg/dL — AB (ref 65–99)
GLUCOSE-CAPILLARY: 88 mg/dL (ref 65–99)
Glucose-Capillary: 84 mg/dL (ref 65–99)

## 2017-04-06 LAB — CBC
HCT: 27.6 % — ABNORMAL LOW (ref 36.0–46.0)
HEMOGLOBIN: 8.6 g/dL — AB (ref 12.0–15.0)
MCH: 27.6 pg (ref 26.0–34.0)
MCHC: 31.2 g/dL (ref 30.0–36.0)
MCV: 88.5 fL (ref 78.0–100.0)
Platelets: 243 10*3/uL (ref 150–400)
RBC: 3.12 MIL/uL — AB (ref 3.87–5.11)
RDW: 19.5 % — ABNORMAL HIGH (ref 11.5–15.5)
WBC: 6.8 10*3/uL (ref 4.0–10.5)

## 2017-04-06 LAB — RENAL FUNCTION PANEL
Albumin: 2.8 g/dL — ABNORMAL LOW (ref 3.5–5.0)
Anion gap: 16 — ABNORMAL HIGH (ref 5–15)
BUN: 42 mg/dL — ABNORMAL HIGH (ref 6–20)
CHLORIDE: 94 mmol/L — AB (ref 101–111)
CO2: 25 mmol/L (ref 22–32)
Calcium: 8.6 mg/dL — ABNORMAL LOW (ref 8.9–10.3)
Creatinine, Ser: 9.21 mg/dL — ABNORMAL HIGH (ref 0.44–1.00)
GFR calc Af Amer: 5 mL/min — ABNORMAL LOW (ref >60)
GFR, EST NON AFRICAN AMERICAN: 5 mL/min — AB (ref >60)
Glucose, Bld: 95 mg/dL (ref 65–99)
POTASSIUM: 4.9 mmol/L (ref 3.5–5.1)
Phosphorus: 8.2 mg/dL — ABNORMAL HIGH (ref 2.5–4.6)
Sodium: 135 mmol/L (ref 135–145)

## 2017-04-06 MED ORDER — DIPHENHYDRAMINE HCL 25 MG PO CAPS
25.0000 mg | ORAL_CAPSULE | Freq: Four times a day (QID) | ORAL | Status: DC | PRN
  Administered 2017-04-06: 25 mg via ORAL

## 2017-04-06 MED ORDER — SEVELAMER CARBONATE 800 MG PO TABS: 3200.0000 mg | ORAL_TABLET | Freq: Three times a day (TID) | ORAL | 0 refills | Status: AC

## 2017-04-06 MED ORDER — DIPHENHYDRAMINE HCL 25 MG PO CAPS
ORAL_CAPSULE | ORAL | Status: AC
  Administered 2017-04-06: 25 mg via ORAL
  Filled 2017-04-06: qty 1

## 2017-04-06 MED ORDER — RENA-VITE PO TABS: 1.0000 | ORAL_TABLET | Freq: Every day | ORAL | 0 refills | Status: DC

## 2017-04-06 NOTE — Progress Notes (Addendum)
[facility] Progress Note   Subjective: Seen in room, L arm feels much better today. Still having some discomfort in lower chest area and mild DOE. No fever or chills, no cough. Worried about her lungs and using incentive spirometer today.  Objective Vitals:   04/05/17 1631 04/05/17 2112 04/06/17 0519 04/06/17 0807  BP: (!) 99/58 (!) 113/54 131/64 128/68  Pulse: 81 71 72 74  Resp: $Remo'18 18 18 18  'UgvOv$ Temp: 98.4 F (36.9 C) 98.5 F (36.9 C) 98.6 F (37 C) 98.5 F (36.9 C)  TempSrc: Oral Oral Oral Oral  SpO2: 97% 99% 99% 98%  Weight:  114.1 kg (251 lb 8.7 oz)    Height:       Physical Exam General: Well appearing female, NAD Heart: RRR; no murmur Lungs: CTA anteriorly Extremities: Trace LE edema, LUE edema/dressing in place over AVF Dialysis Access: [facility] in R chest  Additional Objective Labs: Basic Metabolic Panel: Recent Labs  Lab 04/03/17 2201 04/04/17 0819 04/04/17 1511 04/05/17 0437  NA 136 137  --  135  K 5.0 5.4*  --  5.9*  CL 95* 96*  --  94*  CO2 24 21*  --  22  GLUCOSE 100* 110*  --  101*  BUN 65* 70*  --  84*  CREATININE 12.06* 12.88*  --  14.37*  CALCIUM 8.2* 8.4*  --  8.5*  PHOS  --   --  8.9*  --    Liver Function Tests: Recent Labs  Lab 04/04/17 0819 04/05/17 0437  AST 28 28  ALT 6* <5*  ALKPHOS 47 46  BILITOT 0.5 0.5  PROT 6.7 6.3*  ALBUMIN 3.4* 2.9*   CBC: Recent Labs  Lab 04/03/17 2201 04/04/17 0819 04/05/17 0437  WBC 5.7 7.0 6.6  HGB 9.7* 9.7* 9.0*  HCT 30.9* 32.2* 28.1*  MCV 88.8 88.7 86.7  PLT 229 213 248   Blood Culture    Component Value Date/Time   SDES BLOOD SITE NOT SPECIFIED 04/05/2017 0959   SPECREQUEST  04/05/2017 0959    BOTTLES DRAWN AEROBIC AND ANAEROBIC Blood Culture adequate volume   CULT  04/05/2017 0959    NO GROWTH < 24 HOURS Performed at [facility], [place] [address] [place], [place] [address]    REPTSTATUS PENDING 04/05/2017 4709   Medications: . sodium chloride    . sodium chloride      . allopurinol  100 mg Oral Daily  . aspirin  81 mg Oral Daily  . atorvastatin  40 mg Oral Daily  . doxercalciferol  1 mcg Intravenous Q T,Th,Sa-HD  . feeding supplement (NEPRO CARB STEADY)  237 mL Oral BID BM  . heparin  5,000 Units Subcutaneous Q8H  . insulin aspart  0-9 Units Subcutaneous TID WC  . lisinopril  10 mg Oral Daily  . metoprolol tartrate  12.5 mg Oral Once per day on Sun Mon Wed Fri   And  . metoprolol tartrate  25 mg Oral QHS  . multivitamin  1 tablet Oral QHS  . pantoprazole  40 mg Oral Daily  . senna  2 tablet Oral QHS  . sevelamer carbonate  3,200 mg Oral TID WC  . sodium chloride flush  3 mL Intravenous Q12H  . venlafaxine XR  37.5 mg Oral Q breakfast    Dialysis Orders: TTS NW 4h   400/800   112kg  2/2 bath  Hep 6000   - hect 1 ug - mircera 225 q 2 wks, last 3/21  Assessment/Plan: 1. LUE pain/edema: S/p recent AVF superficialization 3/29. VVS following, s/p TDC to rest AVF. Feels better today. S/p few days of abx for possible cellulitis, now d/c'd. 2. ESRD: Usually TTS schedule, for HD today. 3. HTN/volume: BP ok. Still with lower chest discomfort/mild SOB, will get CXR to assess. 4. Anemia: Hgb 9. Follow. 5. Secondary hyperparathyroidism:  Ca ok, Phos high. Continue home binders for now. 6. DM: per primary.  Veneta Penton, PA-C 04/06/2017, 10:16 AM  [employer] Pager: [phone]  Kelly Splinter MD [facility] pgr [phone]   04/06/2017, 12:15 PM

## 2017-04-06 NOTE — Evaluation (Signed)
Physical Therapy Evaluation Patient Details Name: Patricia [name] MRN: [id number] DOB: 1975-08-01 Today's Date: 04/06/2017   History of Present Illness  42 y.o. female admitted for malfunctioning AV fistula s/p surgical intervention 3/30 after which she experienced L UE pain, swelling, and bloody drainage from surgical site. PMH includes ESDR on HD, HTN, and DM type II.   Clinical Impression  Pt presents with overall decrease in functional mobility secondary to above including impairments listed below (see PT Problem List). Pt most limited by pain and decreased activity tolerance at this time. Bed mobility and transfers mod I and ambulation supervision with no AD. Stair negotiation min guard with mild DOE noted. Pt ascended 3 steps before becoming fatigued. DGI <19 (18/24) indicating pt is at an increased risk for falls. Pt to benefit from continued acute PT to maximize safety, functional mobility, and independence prior to d/c home.      Follow Up Recommendations No PT follow up    Equipment Recommendations  None recommended by PT    Recommendations for Other Services       Precautions / Restrictions Precautions Precautions: Fall      Mobility  Bed Mobility Overal bed mobility: Modified Independent             General bed mobility comments: increased time  Transfers Overall transfer level: Modified independent               General transfer comment: increased time  Ambulation/Gait Ambulation/Gait assistance: Supervision Ambulation Distance (Feet): 200 Feet Assistive device: None Gait Pattern/deviations: Step-through pattern;Antalgic Gait velocity: decreased Gait velocity interpretation: Below normal speed for age/gender General Gait Details: pt with slightly antalgic gait and no arm swing. VCs for increased cadence and arm swing. mildly unsteady with no LOB  Stairs Stairs: Yes Stairs assistance: Min guard Stair Management: One rail Right;One rail Left;Step  to pattern Number of Stairs: 3 General stair comments: pt able to ascend 3 steps before becoming fatigued. noted mild DOE. pt facing forwards for ascending and sideways for descending  Wheelchair Mobility    Modified Rankin (Stroke Patients Only)       Balance Overall balance assessment: Mild deficits observed, not formally tested                               Standardized Balance Assessment Standardized Balance Assessment : Dynamic Gait Index   Dynamic Gait Index Level Surface: Mild Impairment Change in Gait Speed: Normal Gait with Horizontal Head Turns: Mild Impairment Gait with Vertical Head Turns: Mild Impairment Gait and Pivot Turn: Mild Impairment Step Over Obstacle: Mild Impairment Step Around Obstacles: Normal Steps: Mild Impairment Total Score: 18       Pertinent Vitals/Pain Pain Assessment: Faces Faces Pain Scale: Hurts little more Pain Location: L UE, stomach Pain Descriptors / Indicators: Aching;Sore;Discomfort;Guarding Pain Intervention(s): Limited activity within patient's tolerance;Monitored during session;Repositioned    Home Living Family/patient expects to be discharged to:: Private residence Living Arrangements: Children(adult children) Available Help at Discharge: Family;Available PRN/intermittently Type of Home: House Home Access: Level entry     Home Layout: Two level;Bed/bath upstairs Home Equipment: None      Prior Function Level of Independence: Independent               Hand Dominance   Dominant Hand: Right    Extremity/Trunk Assessment   Upper Extremity Assessment Upper Extremity Assessment: LUE deficits/detail LUE Deficits / Details: guarding due to pain from fistula  surgery    Lower Extremity Assessment Lower Extremity Assessment: Generalized weakness       Communication   Communication: No difficulties  Cognition Arousal/Alertness: Awake/alert Behavior During Therapy: WFL for tasks  assessed/performed Overall Cognitive Status: Within Functional Limits for tasks assessed                                        General Comments      Exercises     Assessment/Plan    PT Assessment Patient needs continued PT services  PT Problem List Decreased strength;Decreased activity tolerance;Decreased mobility;Decreased balance;Pain       PT Treatment Interventions DME instruction;Gait training;Stair training;Functional mobility training;Therapeutic activities;Therapeutic exercise;Balance training;Neuromuscular re-education;Patient/family education    PT Goals (Current goals can be found in the Care Plan section)  Acute Rehab PT Goals Patient Stated Goal: to feel better PT Goal Formulation: With patient Time For Goal Achievement: 04/20/17 Potential to Achieve Goals: Good Additional Goals Additional Goal #1: DGI >19    Frequency Min 3X/week   Barriers to discharge        Co-evaluation               AM-PAC PT "6 Clicks" Daily Activity  Outcome Measure Difficulty turning over in bed (including adjusting bedclothes, sheets and blankets)?: None Difficulty moving from lying on back to sitting on the side of the bed? : None Difficulty sitting down on and standing up from a chair with arms (e.g., wheelchair, bedside commode, etc,.)?: None Help needed moving to and from a bed to chair (including a wheelchair)?: None Help needed walking in hospital room?: None Help needed climbing 3-5 steps with a railing? : A Little 6 Click Score: 23    End of Session Equipment Utilized During Treatment: Gait belt Activity Tolerance: Patient tolerated treatment well Patient left: in chair;with call bell/phone within reach Nurse Communication: Mobility status PT Visit Diagnosis: Unsteadiness on feet (R26.81);Difficulty in walking, not elsewhere classified (R26.2);Pain;Muscle weakness (generalized) (M62.81) Pain - Right/Left: Left Pain - part of body: Arm     Time: 2060-1561 PT Time Calculation (min) (ACUTE ONLY): 17 min   Charges:   PT Evaluation $PT Eval Moderate Complexity: 1 Mod     PT G Codes:        Vic Ripper, SPT  Vic Ripper 04/06/2017, 10:36 AM

## 2017-04-06 NOTE — Plan of Care (Signed)
Reviewed HD trmt orders for this trmt.  Also reviewed cath care for patient.  She was able to verbalize back all instructions.

## 2017-04-06 NOTE — Progress Notes (Signed)
Patient tolerated HD trmt fairly well.  Episode of hypotension last hour of trmt.  UF goal lowered, UF at minimum for remainder of trmt.  See flow sheet for further documentation.

## 2017-04-06 NOTE — Discharge Summary (Signed)
Physician Discharge Summary  Patricia [name] PJK:[id number] DOB: February 08, 1975 DOA: 04/03/2017  PCP: Blair Heys, PA-C  Admit date: 04/03/2017 Discharge date: 04/06/2017  Admitted From: Home Disposition: Home  Recommendations for Outpatient Follow-up:  1. Follow up with PCP in 1 week 2. All up with vascular surgery/Dr. Trula Slade at earliest convenience.  Fistula care as per vascular surgery recommendations 3. Follow-up with outpatient dialysis as scheduled   Home Health: No Equipment/Devices: None  Discharge Condition: Stable CODE STATUS: Full Diet recommendation: Heart Healthy / Carb Modified /renal hemodialysis diet  Brief/Interim Summary: 42 year old female with history of diabetes mellitus type 2, hypertension, end-stage renal disease on hemodialysis who underwent elevation of her left arm fistula by vascular surgery on 04/02/2017 and presented on 04/04/2017 for pain/arm swelling and bloody drainage from the site of intervention.  Vascular surgery and nephrology were consulted.  She underwent tunneled catheter placement by vascular surgery.  She was started on broad-spectrum antibiotics for probable cellulitis.  Antibiotics were discontinued after getting clearance from vascular surgery.  Vascular surgery has cleared the patient for discharge.  She will be discharged today after dialysis.  Outpatient follow-up with vascular surgery.     Discharge Diagnoses:  Principal Problem:   Dialysis AV fistula malfunction (HCC) Active Problems:   DM (diabetes mellitus) ([place])   ESRD (end stage renal disease) ([place])  Left upper arm pain/swelling  -status post recent superficialization of AV fistula on 04/02/17. -Patient was empirically started on broad-spectrum antibiotics.  Antibiotics were discontinued on 04/05/2017 after getting clearance from vascular surgery.  Vascular surgery did not think that patient had cellulitis.  -Fistula care as per vascular surgery.  Keep arm elevated.  Outpatient  follow-up with vascular surgery.  Vascular surgery has cleared the patient for discharge   End-stage renal disease on dialysis -Status post tunneled catheter placement by vascular surgery -Currently undergoing dialysis as per nephrology schedule.    Nephrology has cleared the patient for discharge.  Outpatient follow-up with dialysis as scheduled.  Diabetes mellitus type II -Outpatient follow-up. Blood sugars controlled  Hypertension -Monitor blood pressure.  Continue lisinopril and Lopressor.  Blood pressure on the lower side  Hyperlipidemia -Continue Lipitor   GERD -Continue PPI  Gout -Continue allopurinol  Anxiety -Continue Effexor  Obesity -Outpatient follow-up     Discharge Instructions  Discharge Instructions    Ambulatory referral to Vascular Surgery   Complete by:  As directed    Call MD for:  difficulty breathing, headache or visual disturbances   Complete by:  As directed    Call MD for:  extreme fatigue   Complete by:  As directed    Call MD for:  hives   Complete by:  As directed    Call MD for:  persistant dizziness or light-headedness   Complete by:  As directed    Call MD for:  persistant nausea and vomiting   Complete by:  As directed    Call MD for:  redness, tenderness, or signs of infection (pain, swelling, redness, odor or green/yellow discharge around incision site)   Complete by:  As directed    Call MD for:  severe uncontrolled pain   Complete by:  As directed    Call MD for:  temperature >100.4   Complete by:  As directed    Diet - low sodium heart healthy   Complete by:  As directed    Diet Carb Modified   Complete by:  As directed    Discharge instructions   Complete by:  As directed    Fistula care as per vascular surgery recommendations  Renal hemodialysis diet   Increase activity slowly   Complete by:  As directed      Allergies as of 04/06/2017      Reactions   Nsaids Other (See Comments)   Cannot take due to  Kidney disease   Tolmetin Other (See Comments)   Cannot take due to Kidney Disease      Medication List    TAKE these medications   allopurinol 100 MG tablet Commonly known as:  ZYLOPRIM Take 100 mg by mouth daily.   ALPRAZolam 1 MG tablet Commonly known as:  XANAX Take 1 tablet by mouth 2 (two) times daily as needed for anxiety.   aspirin 81 MG chewable tablet Chew 1 tablet (81 mg total) by mouth daily.   atorvastatin 40 MG tablet Commonly known as:  LIPITOR Take 40 mg by mouth daily.   diphenhydramine-acetaminophen 25-500 MG Tabs tablet Commonly known as:  TYLENOL PM Take 2 tablets by mouth at bedtime as needed (for sleep).   lisinopril 10 MG tablet Commonly known as:  PRINIVIL,ZESTRIL Take 10 mg by mouth daily.   metoprolol tartrate 25 MG tablet Commonly known as:  LOPRESSOR Take 12.5-25 mg by mouth See admin instructions. 12.5 mg on Sun Mon Wed Fri in the mornings ( Days not at Dialysis) 25 mg every evening (Sun - Sat)   multivitamin Tabs tablet Take 1 tablet by mouth daily.   omeprazole 40 MG capsule Commonly known as:  PRILOSEC Take 40 mg by mouth daily.   ondansetron 8 MG tablet Commonly known as:  ZOFRAN Take 8 mg by mouth every 8 (eight) hours as needed for nausea/vomiting.   oxyCODONE-acetaminophen 5-325 MG tablet Commonly known as:  PERCOCET/ROXICET Take 1 tablet by mouth every 6 (six) hours as needed. What changed:  reasons to take this   senna 8.6 MG Tabs tablet Commonly known as:  SENOKOT Take 2 tablets (17.2 mg total) by mouth at bedtime.   sevelamer carbonate 800 MG tablet Commonly known as:  RENVELA Take 4 tablets (3,200 mg total) by mouth 3 (three) times daily with meals.   venlafaxine XR 37.5 MG 24 hr capsule Commonly known as:  EFFEXOR-XR Take 37.5 mg by mouth daily with breakfast.      Follow-up Information    Long, Caryl Pina, PA-C. Schedule an appointment as soon as possible for a visit in 1 week(s).   Specialty:  Physician  Assistant Contact information: [place] [phone]        Serafina Mitchell, MD. Schedule an appointment as soon as possible for a visit.   Specialties:  Vascular Surgery, Cardiology Why:  At earliest convenience Contact information: [place] [address] [phone]          Allergies  Allergen Reactions  . Nsaids Other (See Comments)    Cannot take due to Kidney disease  . Tolmetin Other (See Comments)    Cannot take due to Kidney Disease    Consultations:  Vascular surgery/nephrology   Procedures/Studies: X-ray Chest Pa Or Ap  Result Date: 04/04/2017 CLINICAL DATA:  Status post dialysis catheter insertion. EXAM: CHEST  1 VIEW COMPARISON:  04/03/17 FINDINGS: There is a right IJ dialysis catheter with tips projecting over the cavoatrial junction. No pneumothorax visualized. Normal heart size. No pleural effusion or edema. Decreased lung volumes identified. There is mild platelike atelectasis within the right upper lobe and right midlung. IMPRESSION:  1. Right sided dialysis catheter is in place with tip projecting over the cavoatrial junction. No pneumothorax. Electronically Signed   By: Kerby Moors M.D.   On: 04/04/2017 07:41   Dg Chest Port 1 View  Result Date: 04/04/2017 CLINICAL DATA:  Surgical site drainage since yesterday. History of end-stage renal disease on dialysis. EXAM: PORTABLE CHEST 1 VIEW COMPARISON:  Chest radiograph May 11, 2016 FINDINGS: Cardiac silhouette is mildly enlarged, mediastinal silhouette is unremarkable for this low inspiratory examination with crowded vasculature markings. The lungs are clear without pleural effusions or focal consolidations. Trachea projects midline and there is no pneumothorax. Included soft tissue planes and osseous structures are non-suspicious. IMPRESSION: Mild cardiomegaly, no acute pulmonary process. Electronically Signed   By: Elon Alas M.D.   On: 04/04/2017 00:19    Dg Fluoro Guide Cv Line-no Report  Result Date: 04/04/2017 Fluoroscopy was utilized by the requesting physician.  No radiographic interpretation.    Right IJ tunneled hemodialysis catheter placement on 04/04/2017 by vascular surgery     Subjective: Patient seen and examined at bedside.  She feels slightly better.  No overnight fever, nausea or vomiting.  Discharge Exam: Vitals:   04/06/17 0519 04/06/17 0807  BP: 131/64 128/68  Pulse: 72 74  Resp: 18 18  Temp: 98.6 F (37 C) 98.5 F (36.9 C)  SpO2: 99% 98%   Vitals:   04/05/17 1631 04/05/17 2112 04/06/17 0519 04/06/17 0807  BP: (!) 99/58 (!) 113/54 131/64 128/68  Pulse: 81 71 72 74  Resp: $Remo'18 18 18 18  'EqdpT$ Temp: 98.4 F (36.9 C) 98.5 F (36.9 C) 98.6 F (37 C) 98.5 F (36.9 C)  TempSrc: Oral Oral Oral Oral  SpO2: 97% 99% 99% 98%  Weight:  114.1 kg (251 lb 8.7 oz)    Height:        General: Pt is alert, awake, not in acute distress Cardiovascular: Rate controlled, S1/S2 + Respiratory: Bilateral decreased breath sounds at bases Abdominal: Soft, NT, ND, bowel sounds + Extremities: Trace edema, no cyanosis; left arm dressing present    The results of significant diagnostics from this hospitalization (including imaging, microbiology, ancillary and laboratory) are listed below for reference.     Microbiology: Recent Results (from the past 240 hour(s))  Culture, blood (routine x 2)     Status: None (Preliminary result)   Collection Time: 04/05/17  8:26 AM  Result Value Ref Range Status   Specimen Description BLOOD SITE NOT SPECIFIED  Final   Special Requests   Final    BOTTLES DRAWN AEROBIC AND ANAEROBIC Blood Culture adequate volume   Culture   Final    NO GROWTH < 24 HOURS Performed at [facility], [address], [place], [place] [address]    Report Status PENDING  Incomplete  Culture, blood (routine x 2)     Status: None (Preliminary result)   Collection Time: 04/05/17  9:59 AM  Result Value Ref  Range Status   Specimen Description BLOOD SITE NOT SPECIFIED  Final   Special Requests   Final    BOTTLES DRAWN AEROBIC AND ANAEROBIC Blood Culture adequate volume   Culture   Final    NO GROWTH < 24 HOURS Performed at [facility], [place] [address] [place], [place] [address]    Report Status PENDING  Incomplete     Labs: BNP (last 3 results) No results for input(s): BNP in the last 8760 hours. Basic Metabolic Panel: Recent Labs  Lab 04/02/17 1232  04/03/17 2201 04/04/17 0819 04/04/17 1511 04/05/17 0437  NA 139 136 137  --  135  K 5.1 5.0 5.4*  --  5.9*  CL  --  95* 96*  --  94*  CO2  --  24 21*  --  22  GLUCOSE 86 100* 110*  --  101*  BUN  --  65* 70*  --  84*  CREATININE  --  12.06* 12.88*  --  14.37*  CALCIUM  --  8.2* 8.4*  --  8.5*  PHOS  --   --   --  8.9*  --    Liver Function Tests: Recent Labs  Lab 04/04/17 0819 04/05/17 0437  AST 28 28  ALT 6* <5*  ALKPHOS 47 46  BILITOT 0.5 0.5  PROT 6.7 6.3*  ALBUMIN 3.4* 2.9*   No results for input(s): LIPASE, AMYLASE in the last 168 hours. No results for input(s): AMMONIA in the last 168 hours. CBC: Recent Labs  Lab 04/02/17 1232 04/03/17 2201 04/04/17 0819 04/05/17 0437  WBC  --  5.7 7.0 6.6  HGB 11.9* 9.7* 9.7* 9.0*  HCT 35.0* 30.9* 32.2* 28.1*  MCV  --  88.8 88.7 86.7  PLT  --  229 213 248   Cardiac Enzymes: No results for input(s): CKTOTAL, CKMB, CKMBINDEX, TROPONINI in the last 168 hours. BNP: Invalid input(s): POCBNP CBG: Recent Labs  Lab 04/04/17 1953 04/05/17 1424 04/05/17 1631 04/05/17 2110 04/06/17 0753  GLUCAP 136* 123* 116* 119* 84   D-Dimer No results for input(s): DDIMER in the last 72 hours. Hgb A1c No results for input(s): HGBA1C in the last 72 hours. Lipid Profile No results for input(s): CHOL, HDL, LDLCALC, TRIG, CHOLHDL, LDLDIRECT in the last 72 hours. Thyroid function studies No results for input(s): TSH, T4TOTAL, T3FREE, THYROIDAB in the last 72 hours.  Invalid  input(s): FREET3 Anemia work up No results for input(s): VITAMINB12, FOLATE, FERRITIN, TIBC, IRON, RETICCTPCT in the last 72 hours. Urinalysis    Component Value Date/Time   COLORURINE YELLOW 04/03/2016 1626   APPEARANCEUR HAZY (A) 04/03/2016 1626   LABSPEC 1.012 04/03/2016 1626   PHURINE 6.0 04/03/2016 1626   GLUCOSEU 150 (A) 04/03/2016 1626   HGBUR NEGATIVE 04/03/2016 1626   BILIRUBINUR NEGATIVE 04/03/2016 1626   KETONESUR NEGATIVE 04/03/2016 1626   PROTEINUR >=300 (A) 04/03/2016 1626   UROBILINOGEN 0.2 09/19/2014 1620   NITRITE NEGATIVE 04/03/2016 1626   LEUKOCYTESUR NEGATIVE 04/03/2016 1626   Sepsis Labs Invalid input(s): PROCALCITONIN,  WBC,  LACTICIDVEN Microbiology Recent Results (from the past 240 hour(s))  Culture, blood (routine x 2)     Status: None (Preliminary result)   Collection Time: 04/05/17  8:26 AM  Result Value Ref Range Status   Specimen Description BLOOD SITE NOT SPECIFIED  Final   Special Requests   Final    BOTTLES DRAWN AEROBIC AND ANAEROBIC Blood Culture adequate volume   Culture   Final    NO GROWTH < 24 HOURS Performed at [facility], [place] [address], [place], [place] [address]    Report Status PENDING  Incomplete  Culture, blood (routine x 2)     Status: None (Preliminary result)   Collection Time: 04/05/17  9:59 AM  Result Value Ref Range Status   Specimen Description BLOOD SITE NOT SPECIFIED  Final   Special Requests   Final    BOTTLES DRAWN AEROBIC AND ANAEROBIC Blood Culture adequate volume   Culture   Final    NO GROWTH <  24 HOURS Performed at [facility], [place] [address], [place], [place] [address]    Report Status PENDING  Incomplete     Time coordinating discharge: 35 minutes  SIGNED:   Aline August, MD  [facility] 04/06/2017, 11:01 AM Pager: [phone]  If 7PM-7AM, please contact night-coverage [email or web address] Password [email or web address]

## 2017-04-06 NOTE — Progress Notes (Signed)
Patient discharged to home. After visit Summary reviewed. Patient capable of reverbalizing medications and follow up visits. No signs and symptoms of distress noted. Patient educated to return to the ED in the case of an emergency.McCone K Sandee Bernath

## 2017-04-06 NOTE — Progress Notes (Signed)
Paged MD regarding order of the chest x-ray. MD states if patient is asymptomatic go ahead with discharge.Aldan K Tosh Glaze

## 2017-04-07 ENCOUNTER — Telehealth: Payer: Self-pay | Admitting: Surgery

## 2017-04-07 NOTE — Telephone Encounter (Signed)
-----   Message from Mena Goes, RN sent at 04/05/2017  2:26 PM EDT ----- Regarding: 3-4 weeks in PA clinic   ----- Message ----- From: Serafina Mitchell, MD Sent: 04/02/2017   6:05 PM To: [facility] Charge Pool  04-02-2017:  Surgeon:  [name] Assistants:  Lennie Muckle Procedure:   Elevation of left brachiocephalic fistula   F/u PA clinic in 3-4 weeks

## 2017-04-07 NOTE — Telephone Encounter (Signed)
Spoke to pt for appt on 4/24 w/PA  Mld lttr  Pt also asked to speak with triage nurse RE bandage issues, trans to becky

## 2017-04-10 LAB — CULTURE, BLOOD (ROUTINE X 2)
Culture: NO GROWTH
Culture: NO GROWTH
Special Requests: ADEQUATE
Special Requests: ADEQUATE

## 2017-04-14 ENCOUNTER — Encounter (HOSPITAL_COMMUNITY): Payer: Self-pay | Admitting: Vascular Surgery

## 2017-04-28 ENCOUNTER — Other Ambulatory Visit: Payer: Self-pay

## 2017-04-28 ENCOUNTER — Encounter: Payer: Self-pay | Admitting: Physician Assistant

## 2017-04-28 ENCOUNTER — Ambulatory Visit (INDEPENDENT_AMBULATORY_CARE_PROVIDER_SITE_OTHER): Payer: Medicare Other | Admitting: Physician Assistant

## 2017-04-28 VITALS — BP 149/92 | HR 77 | Temp 97.6°F | Resp 16 | Ht 67.0 in | Wt 250.0 lb

## 2017-04-28 DIAGNOSIS — N186 End stage renal disease: Secondary | ICD-10-CM

## 2017-04-28 DIAGNOSIS — Z992 Dependence on renal dialysis: Secondary | ICD-10-CM

## 2017-04-28 NOTE — Progress Notes (Signed)
POST OPERATIVE OFFICE NOTE    CC:  F/u for surgery  HPI:  This is a 42 y.o. female who is s/p Elevation of left brachiocephalic AVF on 04/02/17 by Dr. Myra Gianotti and placement of [facility] on 04/04/17 by Dr. Randie Heinz.  She originally underwent ligation of left radial cephalic AVF and creation of left brachial cephalic AVF by Dr. Edilia Bo on 04/14/16 and subsequently underwent revision of left brachiocephalic AVF and bovine pericardial patch angioplasty by Dr. Edilia Bo on 06/19/16.  She returns today for follow up.  She states that she does have some numbness in her arm and sometimes in her fingers but she is not having any trouble with grip or moving her fingers/hand.  She dialyzes T/T/S on [address]  She is hoping to be on peritoneal dialysis at home.  She is dialyzing via her right IJ TDC.   Allergies  Allergen Reactions  . Nsaids Other (See Comments)    Cannot take due to Kidney disease  . Tolmetin Other (See Comments)    Cannot take due to Kidney Disease    Current Outpatient Medications  Medication Sig Dispense Refill  . allopurinol (ZYLOPRIM) 100 MG tablet Take 100 mg by mouth daily.    Marland Kitchen ALPRAZolam (XANAX) 1 MG tablet Take 1 tablet by mouth 2 (two) times daily as needed for anxiety.     Marland Kitchen aspirin 81 MG chewable tablet Chew 1 tablet (81 mg total) by mouth daily.    Marland Kitchen atorvastatin (LIPITOR) 40 MG tablet Take 40 mg by mouth daily.  3  . diphenhydramine-acetaminophen (TYLENOL PM) 25-500 MG TABS tablet Take 2 tablets by mouth at bedtime as needed (for sleep).     Marland Kitchen lisinopril (PRINIVIL,ZESTRIL) 10 MG tablet Take 10 mg by mouth daily.    . metoprolol tartrate (LOPRESSOR) 25 MG tablet Take 12.5-25 mg by mouth See admin instructions. 12.5 mg on Sun Mon Wed Fri in the mornings ( Days not at Dialysis) 25 mg every evening (Sun - Sat)    . multivitamin (RENA-VIT) TABS tablet Take 1 tablet by mouth daily.    Marland Kitchen omeprazole (PRILOSEC) 40 MG capsule Take 40 mg by mouth daily.     . ondansetron (ZOFRAN) 8  MG tablet Take 8 mg by mouth every 8 (eight) hours as needed for nausea/vomiting.  0  . oxyCODONE-acetaminophen (PERCOCET/ROXICET) 5-325 MG tablet Take 1 tablet by mouth every 6 (six) hours as needed. (Patient taking differently: Take 1 tablet by mouth every 6 (six) hours as needed for moderate pain. ) 12 tablet 0  . senna (SENOKOT) 8.6 MG TABS tablet Take 2 tablets (17.2 mg total) by mouth at bedtime. 120 each 0  . sevelamer carbonate (RENVELA) 800 MG tablet Take 4 tablets (3,200 mg total) by mouth 3 (three) times daily with meals. 360 tablet 0  . venlafaxine XR (EFFEXOR-XR) 37.5 MG 24 hr capsule Take 37.5 mg by mouth daily with breakfast.     No current facility-administered medications for this visit.      ROS:  See HPI  Physical Exam: Vitals:   04/28/17 1515  BP: (!) 149/92  Pulse: 77  Resp: 16  Temp: 97.6 F (36.4 C)  SpO2: 97%   Vitals:   04/28/17 1515  Weight: 250 lb (113.4 kg)  Height: 5\' 7"  (1.702 m)     Incision:  Healing nicely.  Extremities:  +thrill within fistula and is easily palpable.  Unable to palpable left radial pulse.  Hand is warm and motor and sensation  are in tact.  Right IJ TDC in place.     Assessment/Plan:  This is a 42 y.o. female who is s/p: Elevation of left brachiocephalic AVF on 1/36/43 by Dr. Trula Slade and placement of [facility] on 04/04/17 by Dr. Donzetta Matters.   -pt's incisions are healing nicely.  Her fistula has a +thrill. -her fistula can be used on 5/2.  Once her fistula has been used 2-3 times successfully, her [facility] can be removed.  She is on a daily baby aspirin and not other anticoagulation.  Discussed this with the pt.  She is hoping to be on peritoneal dialysis in the future.   -she does have some numbness in her arm and fingers but motor is grossly in tact.  The numbness could also be related to nerve irritation from surgery.  Discussed with her the symptoms of steal syndrome and if she develops these, we would need to see her sooner rather than later.   She and her family member expressed understanding. -we will see her back as needed.     Leontine Locket, PA-C [facility] [phone]  Clinic MD:  Bridgett Larsson

## 2017-05-24 DIAGNOSIS — R17 Unspecified jaundice: Secondary | ICD-10-CM | POA: Insufficient documentation

## 2017-05-24 DIAGNOSIS — R8290 Unspecified abnormal findings in urine: Secondary | ICD-10-CM | POA: Insufficient documentation

## 2017-05-24 DIAGNOSIS — E872 Acidosis, unspecified: Secondary | ICD-10-CM | POA: Insufficient documentation

## 2017-05-24 DIAGNOSIS — Z79899 Other long term (current) drug therapy: Secondary | ICD-10-CM | POA: Insufficient documentation

## 2017-05-24 DIAGNOSIS — R6883 Chills (without fever): Secondary | ICD-10-CM | POA: Insufficient documentation

## 2017-05-24 DIAGNOSIS — E878 Other disorders of electrolyte and fluid balance, not elsewhere classified: Secondary | ICD-10-CM | POA: Insufficient documentation

## 2017-05-24 DIAGNOSIS — D509 Iron deficiency anemia, unspecified: Secondary | ICD-10-CM | POA: Insufficient documentation

## 2017-05-24 DIAGNOSIS — E1129 Type 2 diabetes mellitus with other diabetic kidney complication: Secondary | ICD-10-CM | POA: Insufficient documentation

## 2017-05-24 DIAGNOSIS — N2589 Other disorders resulting from impaired renal tubular function: Secondary | ICD-10-CM | POA: Insufficient documentation

## 2017-05-24 DIAGNOSIS — N2581 Secondary hyperparathyroidism of renal origin: Secondary | ICD-10-CM | POA: Insufficient documentation

## 2017-05-24 DIAGNOSIS — K769 Liver disease, unspecified: Secondary | ICD-10-CM | POA: Insufficient documentation

## 2017-05-24 DIAGNOSIS — Z992 Dependence on renal dialysis: Secondary | ICD-10-CM | POA: Insufficient documentation

## 2017-05-24 DIAGNOSIS — E8770 Fluid overload, unspecified: Secondary | ICD-10-CM | POA: Insufficient documentation

## 2017-05-24 DIAGNOSIS — D689 Coagulation defect, unspecified: Secondary | ICD-10-CM | POA: Insufficient documentation

## 2017-05-24 DIAGNOSIS — N189 Chronic kidney disease, unspecified: Secondary | ICD-10-CM | POA: Insufficient documentation

## 2017-05-24 DIAGNOSIS — Z4901 Encounter for fitting and adjustment of extracorporeal dialysis catheter: Secondary | ICD-10-CM | POA: Insufficient documentation

## 2017-06-02 ENCOUNTER — Inpatient Hospital Stay (HOSPITAL_COMMUNITY)
Admission: EM | Admit: 2017-06-02 | Discharge: 2017-06-04 | DRG: 377 | Disposition: A | Discharge status: Home / Self Care | Payer: Medicare Other | Attending: Internal Medicine | Admitting: Internal Medicine

## 2017-06-02 ENCOUNTER — Encounter (HOSPITAL_COMMUNITY): Payer: Self-pay | Admitting: Emergency Medicine

## 2017-06-02 ENCOUNTER — Other Ambulatory Visit: Payer: Self-pay

## 2017-06-02 ENCOUNTER — Emergency Department (HOSPITAL_COMMUNITY): Payer: Medicare Other

## 2017-06-02 DIAGNOSIS — Z992 Dependence on renal dialysis: Secondary | ICD-10-CM | POA: Diagnosis present

## 2017-06-02 DIAGNOSIS — K625 Hemorrhage of anus and rectum: Secondary | ICD-10-CM | POA: Diagnosis not present

## 2017-06-02 DIAGNOSIS — I120 Hypertensive chronic kidney disease with stage 5 chronic kidney disease or end stage renal disease: Secondary | ICD-10-CM | POA: Diagnosis present

## 2017-06-02 DIAGNOSIS — Z79899 Other long term (current) drug therapy: Secondary | ICD-10-CM

## 2017-06-02 DIAGNOSIS — N2581 Secondary hyperparathyroidism of renal origin: Secondary | ICD-10-CM | POA: Diagnosis present

## 2017-06-02 DIAGNOSIS — E7800 Pure hypercholesterolemia, unspecified: Secondary | ICD-10-CM | POA: Diagnosis present

## 2017-06-02 DIAGNOSIS — R109 Unspecified abdominal pain: Secondary | ICD-10-CM | POA: Diagnosis present

## 2017-06-02 DIAGNOSIS — E8889 Other specified metabolic disorders: Secondary | ICD-10-CM | POA: Diagnosis present

## 2017-06-02 DIAGNOSIS — K219 Gastro-esophageal reflux disease without esophagitis: Secondary | ICD-10-CM | POA: Diagnosis present

## 2017-06-02 DIAGNOSIS — R609 Edema, unspecified: Secondary | ICD-10-CM | POA: Diagnosis present

## 2017-06-02 DIAGNOSIS — Z6841 Body Mass Index (BMI) 40.0 and over, adult: Secondary | ICD-10-CM

## 2017-06-02 DIAGNOSIS — R402413 Glasgow coma scale score 13-15, at hospital admission: Secondary | ICD-10-CM | POA: Diagnosis present

## 2017-06-02 DIAGNOSIS — K921 Melena: Secondary | ICD-10-CM | POA: Diagnosis present

## 2017-06-02 DIAGNOSIS — E1122 Type 2 diabetes mellitus with diabetic chronic kidney disease: Secondary | ICD-10-CM | POA: Diagnosis present

## 2017-06-02 DIAGNOSIS — M109 Gout, unspecified: Secondary | ICD-10-CM | POA: Diagnosis present

## 2017-06-02 DIAGNOSIS — D631 Anemia in chronic kidney disease: Secondary | ICD-10-CM | POA: Diagnosis present

## 2017-06-02 DIAGNOSIS — Z9221 Personal history of antineoplastic chemotherapy: Secondary | ICD-10-CM

## 2017-06-02 DIAGNOSIS — N186 End stage renal disease: Secondary | ICD-10-CM | POA: Diagnosis present

## 2017-06-02 DIAGNOSIS — D62 Acute posthemorrhagic anemia: Secondary | ICD-10-CM | POA: Diagnosis present

## 2017-06-02 DIAGNOSIS — I10 Essential (primary) hypertension: Secondary | ICD-10-CM | POA: Diagnosis present

## 2017-06-02 DIAGNOSIS — E119 Type 2 diabetes mellitus without complications: Secondary | ICD-10-CM

## 2017-06-02 DIAGNOSIS — K254 Chronic or unspecified gastric ulcer with hemorrhage: Principal | ICD-10-CM | POA: Diagnosis present

## 2017-06-02 DIAGNOSIS — K2970 Gastritis, unspecified, without bleeding: Secondary | ICD-10-CM | POA: Diagnosis present

## 2017-06-02 DIAGNOSIS — K2980 Duodenitis without bleeding: Secondary | ICD-10-CM | POA: Diagnosis present

## 2017-06-02 DIAGNOSIS — K449 Diaphragmatic hernia without obstruction or gangrene: Secondary | ICD-10-CM | POA: Diagnosis present

## 2017-06-02 DIAGNOSIS — F419 Anxiety disorder, unspecified: Secondary | ICD-10-CM | POA: Diagnosis present

## 2017-06-02 DIAGNOSIS — Z886 Allergy status to analgesic agent status: Secondary | ICD-10-CM

## 2017-06-02 DIAGNOSIS — Z7982 Long term (current) use of aspirin: Secondary | ICD-10-CM

## 2017-06-02 LAB — COMPREHENSIVE METABOLIC PANEL
ALT: 12 U/L — ABNORMAL LOW (ref 14–54)
AST: 26 U/L (ref 15–41)
Albumin: 3 g/dL — ABNORMAL LOW (ref 3.5–5.0)
Alkaline Phosphatase: 51 U/L (ref 38–126)
Anion gap: 17 — ABNORMAL HIGH (ref 5–15)
BUN: 85 mg/dL — ABNORMAL HIGH (ref 6–20)
CHLORIDE: 105 mmol/L (ref 101–111)
CO2: 20 mmol/L — ABNORMAL LOW (ref 22–32)
Calcium: 8.5 mg/dL — ABNORMAL LOW (ref 8.9–10.3)
Creatinine, Ser: 17.08 mg/dL — ABNORMAL HIGH (ref 0.44–1.00)
GFR, EST AFRICAN AMERICAN: 3 mL/min — AB (ref >60)
GFR, EST NON AFRICAN AMERICAN: 2 mL/min — AB (ref >60)
Glucose, Bld: 86 mg/dL (ref 65–99)
POTASSIUM: 5 mmol/L (ref 3.5–5.1)
Sodium: 142 mmol/L (ref 135–145)
Total Bilirubin: 0.4 mg/dL (ref 0.3–1.2)
Total Protein: 6.2 g/dL — ABNORMAL LOW (ref 6.5–8.1)

## 2017-06-02 LAB — CBC
HEMATOCRIT: 33.2 % — AB (ref 36.0–46.0)
Hemoglobin: 9.9 g/dL — ABNORMAL LOW (ref 12.0–15.0)
MCH: 27.8 pg (ref 26.0–34.0)
MCHC: 29.8 g/dL — ABNORMAL LOW (ref 30.0–36.0)
MCV: 93.3 fL (ref 78.0–100.0)
PLATELETS: 261 10*3/uL (ref 150–400)
RBC: 3.56 MIL/uL — AB (ref 3.87–5.11)
RDW: 18.8 % — ABNORMAL HIGH (ref 11.5–15.5)
WBC: 5.4 10*3/uL (ref 4.0–10.5)

## 2017-06-02 LAB — I-STAT BETA HCG BLOOD, ED (MC, WL, AP ONLY): I-stat hCG, quantitative: <5 m[IU]/mL (ref <5)

## 2017-06-02 LAB — POC OCCULT BLOOD, ED: FECAL OCCULT BLD: POSITIVE — AB

## 2017-06-02 LAB — TYPE AND SCREEN
ABO/RH(D): O NEG
Antibody Screen: NEGATIVE

## 2017-06-02 NOTE — ED Triage Notes (Signed)
Pt states she is a dialysis pt. Has a PD catheter and dialyzes daily.  Pt endorses dark tarry stools x 2 days about 4-5 occurrences a day.  Pt states it has a terrible odor as well.  Lower abdominal pain/cramping.

## 2017-06-02 NOTE — ED Provider Notes (Signed)
Patient placed in Quick Look pathway, seen and evaluated   Chief Complaint: melena and abdominal pain   HPI:   Patient presents with cc of melena  For the past 3 days. She has had pain in her lower abdomen. New to PD at home for the past 2 weeks with PD catheter in place.   ROS: melena (one)  Physical Exam:   Gen: No distress  Neuro: Awake and Alert  Skin: Warm    Focused Exam: [place] Lower abd. Digital Rectal Exam reveals sphincter with good tone. No external hemorrhoids. No masses or fissures. Stool color is dark.   Initiation of care has begun. The patient has been counseled on the process, plan, and necessity for staying for the completion/evaluation, and the remainder of the medical screening examination    Margarita Mail, PA-C 06/02/17 2016    Macarthur Critchley, MD 06/03/17 2358

## 2017-06-02 NOTE — ED Notes (Signed)
Pt back from CT, IV team bedside

## 2017-06-03 ENCOUNTER — Encounter (HOSPITAL_COMMUNITY): Payer: Self-pay | Admitting: Emergency Medicine

## 2017-06-03 ENCOUNTER — Other Ambulatory Visit: Payer: Self-pay

## 2017-06-03 DIAGNOSIS — I151 Hypertension secondary to other renal disorders: Secondary | ICD-10-CM

## 2017-06-03 DIAGNOSIS — N186 End stage renal disease: Secondary | ICD-10-CM | POA: Diagnosis present

## 2017-06-03 DIAGNOSIS — K219 Gastro-esophageal reflux disease without esophagitis: Secondary | ICD-10-CM | POA: Diagnosis present

## 2017-06-03 DIAGNOSIS — K625 Hemorrhage of anus and rectum: Secondary | ICD-10-CM | POA: Diagnosis present

## 2017-06-03 DIAGNOSIS — R109 Unspecified abdominal pain: Secondary | ICD-10-CM | POA: Diagnosis present

## 2017-06-03 DIAGNOSIS — F419 Anxiety disorder, unspecified: Secondary | ICD-10-CM | POA: Diagnosis present

## 2017-06-03 DIAGNOSIS — R402413 Glasgow coma scale score 13-15, at hospital admission: Secondary | ICD-10-CM | POA: Diagnosis present

## 2017-06-03 DIAGNOSIS — K254 Chronic or unspecified gastric ulcer with hemorrhage: Secondary | ICD-10-CM | POA: Diagnosis present

## 2017-06-03 DIAGNOSIS — N2889 Other specified disorders of kidney and ureter: Secondary | ICD-10-CM | POA: Diagnosis not present

## 2017-06-03 DIAGNOSIS — R1030 Lower abdominal pain, unspecified: Secondary | ICD-10-CM

## 2017-06-03 DIAGNOSIS — K2980 Duodenitis without bleeding: Secondary | ICD-10-CM | POA: Diagnosis present

## 2017-06-03 DIAGNOSIS — Z79899 Other long term (current) drug therapy: Secondary | ICD-10-CM | POA: Diagnosis not present

## 2017-06-03 DIAGNOSIS — E1122 Type 2 diabetes mellitus with diabetic chronic kidney disease: Secondary | ICD-10-CM | POA: Diagnosis present

## 2017-06-03 DIAGNOSIS — Z886 Allergy status to analgesic agent status: Secondary | ICD-10-CM | POA: Diagnosis not present

## 2017-06-03 DIAGNOSIS — K921 Melena: Secondary | ICD-10-CM | POA: Diagnosis not present

## 2017-06-03 DIAGNOSIS — Z992 Dependence on renal dialysis: Secondary | ICD-10-CM | POA: Diagnosis not present

## 2017-06-03 DIAGNOSIS — D631 Anemia in chronic kidney disease: Secondary | ICD-10-CM | POA: Diagnosis present

## 2017-06-03 DIAGNOSIS — Z7982 Long term (current) use of aspirin: Secondary | ICD-10-CM | POA: Diagnosis not present

## 2017-06-03 DIAGNOSIS — R609 Edema, unspecified: Secondary | ICD-10-CM | POA: Diagnosis present

## 2017-06-03 DIAGNOSIS — Z6841 Body Mass Index (BMI) 40.0 and over, adult: Secondary | ICD-10-CM | POA: Diagnosis not present

## 2017-06-03 DIAGNOSIS — K2970 Gastritis, unspecified, without bleeding: Secondary | ICD-10-CM | POA: Diagnosis present

## 2017-06-03 DIAGNOSIS — K449 Diaphragmatic hernia without obstruction or gangrene: Secondary | ICD-10-CM | POA: Diagnosis present

## 2017-06-03 DIAGNOSIS — N2581 Secondary hyperparathyroidism of renal origin: Secondary | ICD-10-CM | POA: Diagnosis present

## 2017-06-03 DIAGNOSIS — E8889 Other specified metabolic disorders: Secondary | ICD-10-CM | POA: Diagnosis present

## 2017-06-03 DIAGNOSIS — I120 Hypertensive chronic kidney disease with stage 5 chronic kidney disease or end stage renal disease: Secondary | ICD-10-CM | POA: Diagnosis present

## 2017-06-03 DIAGNOSIS — D62 Acute posthemorrhagic anemia: Secondary | ICD-10-CM | POA: Diagnosis present

## 2017-06-03 DIAGNOSIS — M109 Gout, unspecified: Secondary | ICD-10-CM | POA: Diagnosis present

## 2017-06-03 DIAGNOSIS — Z9221 Personal history of antineoplastic chemotherapy: Secondary | ICD-10-CM | POA: Diagnosis not present

## 2017-06-03 DIAGNOSIS — E7800 Pure hypercholesterolemia, unspecified: Secondary | ICD-10-CM | POA: Diagnosis present

## 2017-06-03 LAB — BASIC METABOLIC PANEL
Anion gap: 17 — ABNORMAL HIGH (ref 5–15)
BUN: 87 mg/dL — AB (ref 6–20)
CALCIUM: 8.3 mg/dL — AB (ref 8.9–10.3)
CO2: 14 mmol/L — AB (ref 22–32)
Chloride: 109 mmol/L (ref 101–111)
Creatinine, Ser: 16.89 mg/dL — ABNORMAL HIGH (ref 0.44–1.00)
GFR calc Af Amer: 3 mL/min — ABNORMAL LOW (ref >60)
GFR, EST NON AFRICAN AMERICAN: 2 mL/min — AB (ref >60)
GLUCOSE: 84 mg/dL (ref 65–99)
Potassium: 5.8 mmol/L — ABNORMAL HIGH (ref 3.5–5.1)
Sodium: 140 mmol/L (ref 135–145)

## 2017-06-03 LAB — CBC
HCT: 33.3 % — ABNORMAL LOW (ref 36.0–46.0)
HCT: 37.2 % (ref 36.0–46.0)
Hemoglobin: 9.8 g/dL — ABNORMAL LOW (ref 12.0–15.0)
Hemoglobin: 11.3 g/dL — ABNORMAL LOW (ref 12.0–15.0)
MCH: 27.5 pg (ref 26.0–34.0)
MCH: 27.5 pg (ref 26.0–34.0)
MCHC: 29.4 g/dL — ABNORMAL LOW (ref 30.0–36.0)
MCHC: 30.4 g/dL (ref 30.0–36.0)
MCV: 93.3 fL (ref 78.0–100.0)
MCV: 90.5 fL (ref 78.0–100.0)
PLATELETS: 240 10*3/uL (ref 150–400)
PLATELETS: 210 10*3/uL (ref 150–400)
RBC: 3.57 MIL/uL — ABNORMAL LOW (ref 3.87–5.11)
RBC: 4.11 MIL/uL (ref 3.87–5.11)
RDW: 18.8 % — AB (ref 11.5–15.5)
RDW: 18.7 % — ABNORMAL HIGH (ref 11.5–15.5)
WBC: 6.7 10*3/uL (ref 4.0–10.5)
WBC: 11.1 10*3/uL — ABNORMAL HIGH (ref 4.0–10.5)

## 2017-06-03 LAB — BODY FLUID CELL COUNT WITH DIFFERENTIAL
EOS FL: 4 %
LYMPHS FL: 14 %
Monocyte-Macrophage-Serous Fluid: 77 % (ref 50–90)
NEUTROPHIL FLUID: 5 % (ref 0–25)
WBC FLUID: 51 uL (ref 0–1000)

## 2017-06-03 MED ORDER — ALPRAZOLAM 0.5 MG PO TABS
1.0000 mg | ORAL_TABLET | Freq: Two times a day (BID) | ORAL | Status: DC
  Administered 2017-06-03 – 2017-06-04 (×3): 1 mg via ORAL
  Filled 2017-06-03 (×3): qty 2

## 2017-06-03 MED ORDER — ONDANSETRON HCL 4 MG/2ML IJ SOLN
4.0000 mg | Freq: Once | INTRAMUSCULAR | Status: AC
  Administered 2017-06-03: 4 mg via INTRAVENOUS
  Filled 2017-06-03: qty 2

## 2017-06-03 MED ORDER — FENTANYL CITRATE (PF) 100 MCG/2ML IJ SOLN
50.0000 ug | INTRAMUSCULAR | Status: DC | PRN
  Administered 2017-06-03 – 2017-06-04 (×5): 50 ug via INTRAVENOUS
  Filled 2017-06-03 (×5): qty 2

## 2017-06-03 MED ORDER — SEVELAMER CARBONATE 800 MG PO TABS
4000.0000 mg | ORAL_TABLET | Freq: Three times a day (TID) | ORAL | Status: DC
  Administered 2017-06-04: 4000 mg via ORAL
  Filled 2017-06-03 (×5): qty 5

## 2017-06-03 MED ORDER — FENTANYL CITRATE (PF) 100 MCG/2ML IJ SOLN
INTRAMUSCULAR | Status: AC
  Filled 2017-06-03: qty 2

## 2017-06-03 MED ORDER — ONDANSETRON HCL 4 MG PO TABS: 4.0000 mg | ORAL_TABLET | Freq: Four times a day (QID) | ORAL | Status: DC | PRN

## 2017-06-03 MED ORDER — ALLOPURINOL 100 MG PO TABS
100.0000 mg | ORAL_TABLET | Freq: Every day | ORAL | Status: DC
  Administered 2017-06-03 – 2017-06-04 (×2): 100 mg via ORAL
  Filled 2017-06-03 (×3): qty 1

## 2017-06-03 MED ORDER — LISINOPRIL 10 MG PO TABS
10.0000 mg | ORAL_TABLET | Freq: Every day | ORAL | Status: DC
  Administered 2017-06-03 – 2017-06-04 (×2): 10 mg via ORAL
  Filled 2017-06-03 (×2): qty 1

## 2017-06-03 MED ORDER — DARBEPOETIN ALFA 200 MCG/0.4ML IJ SOSY
PREFILLED_SYRINGE | INTRAMUSCULAR | Status: AC
  Administered 2017-06-03: 200 ug via INTRAVENOUS
  Filled 2017-06-03: qty 0.4

## 2017-06-03 MED ORDER — METOPROLOL TARTRATE 25 MG PO TABS
25.0000 mg | ORAL_TABLET | Freq: Every day | ORAL | Status: DC
  Administered 2017-06-03 – 2017-06-04 (×2): 25 mg via ORAL
  Filled 2017-06-03 (×2): qty 1

## 2017-06-03 MED ORDER — RENA-VITE PO TABS
1.0000 | ORAL_TABLET | Freq: Every day | ORAL | Status: DC
  Administered 2017-06-03 – 2017-06-04 (×2): 1 via ORAL
  Filled 2017-06-03 (×3): qty 1

## 2017-06-03 MED ORDER — DOXERCALCIFEROL 4 MCG/2ML IV SOLN
INTRAVENOUS | Status: AC
  Administered 2017-06-03: 4 ug via INTRAVENOUS
  Filled 2017-06-03: qty 4

## 2017-06-03 MED ORDER — PANTOPRAZOLE SODIUM 40 MG PO TBEC
80.0000 mg | DELAYED_RELEASE_TABLET | Freq: Two times a day (BID) | ORAL | Status: DC
  Administered 2017-06-03: 80 mg via ORAL
  Filled 2017-06-03: qty 2

## 2017-06-03 MED ORDER — SEVELAMER CARBONATE 800 MG PO TABS
2400.0000 mg | ORAL_TABLET | Freq: Two times a day (BID) | ORAL | Status: DC | PRN
  Filled 2017-06-03: qty 3

## 2017-06-03 MED ORDER — VENLAFAXINE HCL ER 75 MG PO CP24
75.0000 mg | ORAL_CAPSULE | Freq: Every day | ORAL | Status: DC
  Administered 2017-06-04: 75 mg via ORAL
  Filled 2017-06-03 (×3): qty 1

## 2017-06-03 MED ORDER — DOXERCALCIFEROL 4 MCG/2ML IV SOLN
4.0000 ug | INTRAVENOUS | Status: DC
  Administered 2017-06-03: 4 ug via INTRAVENOUS

## 2017-06-03 MED ORDER — DARBEPOETIN ALFA 200 MCG/0.4ML IJ SOSY
200.0000 ug | PREFILLED_SYRINGE | INTRAMUSCULAR | Status: DC
  Administered 2017-06-03: 200 ug via INTRAVENOUS
  Filled 2017-06-03: qty 0.4

## 2017-06-03 MED ORDER — PANTOPRAZOLE SODIUM 40 MG IV SOLR
40.0000 mg | Freq: Two times a day (BID) | INTRAVENOUS | Status: DC
  Administered 2017-06-03 (×2): 40 mg via INTRAVENOUS
  Filled 2017-06-03 (×4): qty 40

## 2017-06-03 MED ORDER — ACETAMINOPHEN 325 MG PO TABS
ORAL_TABLET | ORAL | Status: AC
  Administered 2017-06-03: 650 mg via ORAL
  Filled 2017-06-03: qty 2

## 2017-06-03 MED ORDER — ONDANSETRON HCL 4 MG/2ML IJ SOLN: 4.0000 mg | Freq: Four times a day (QID) | INTRAMUSCULAR | Status: DC | PRN

## 2017-06-03 MED ORDER — FENTANYL CITRATE (PF) 100 MCG/2ML IJ SOLN
50.0000 ug | Freq: Once | INTRAMUSCULAR | Status: AC
  Administered 2017-06-03: 50 ug via INTRAVENOUS

## 2017-06-03 MED ORDER — ACETAMINOPHEN 325 MG PO TABS
650.0000 mg | ORAL_TABLET | Freq: Four times a day (QID) | ORAL | Status: DC | PRN
  Administered 2017-06-03 (×3): 650 mg via ORAL
  Filled 2017-06-03 (×2): qty 2

## 2017-06-03 MED ORDER — SODIUM CHLORIDE 0.9 % IV SOLN
INTRAVENOUS | Status: DC
  Administered 2017-06-03 – 2017-06-04 (×2): via INTRAVENOUS

## 2017-06-03 MED ORDER — ATORVASTATIN CALCIUM 40 MG PO TABS
40.0000 mg | ORAL_TABLET | Freq: Every day | ORAL | Status: DC
  Administered 2017-06-03 – 2017-06-04 (×2): 40 mg via ORAL
  Filled 2017-06-03 (×3): qty 1

## 2017-06-03 MED ORDER — ACETAMINOPHEN 650 MG RE SUPP: 650.0000 mg | Freq: Four times a day (QID) | RECTAL | Status: DC | PRN

## 2017-06-03 NOTE — Consult Note (Signed)
[name] Gastroenterology Consultation Note  Referring Provider: Dr. Bonnielee [name] [facility]) Primary Care Physician:  Patricia Heys, PA-C  Reason for Consultation:  melena  HPI: Patricia [name] is a 42 y.o. female whom we've been asked to see for melena.  She has chronic GERD.  Has endstage renal disease, started peritoneal dialysis couple weeks ago.  Had endoscopy for GERD in [place] a few years ago, negative per patient recollection.  Over the past few days, patient has developed epigastric pain and black stools.  ASA, but no other NSAIDs or anticoagulants.  No prior GI bleeding.   Past Medical History:  Diagnosis Date  . Acid reflux   . Anxiety   . ESRD (end stage renal disease) ([place])    TTHSAT- [place]  . Gout   . Headache(784.0)    "related to chemo; sometimes weekly" (09/12/2013)  . High cholesterol   . History of blood transfusion "a couple"   "related to low counts"  . Hypertension   . Iron deficiency anemia    "get epogen shots q month" (02/20/2014)  . 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 Type II diabetes mellitus ([place])    "took me off my RX ~ 04/2013"    Past Surgical History:  Procedure Laterality Date  . ABDOMINAL HYSTERECTOMY  2010   "laparoscopic"  . ANKLE FRACTURE SURGERY Right 1994  . AV FISTULA PLACEMENT Left 04/14/2016   Procedure: ARTERIOVENOUS (AV) FISTULA CREATION;  Surgeon: Angelia Mould, MD;  Location: [place];  Service: Vascular;  Laterality: Left;  . CARDIAC CATHETERIZATION  2000's  . ESOPHAGOGASTRODUODENOSCOPY    . FISTULA SUPERFICIALIZATION Left 04/02/2017   Procedure: FISTULA SUPERFICIALIZATION LEFT ARM;  Surgeon: Serafina Mitchell, MD;  Location: [place];  Service: Vascular;  Laterality: Left;  . FISTULOGRAM Left 04/07/2016   Procedure: FISTULOGRAM;  Surgeon: Waynetta Sandy, MD;  Location: [place];  Service: Vascular;  Laterality: Left;  . FRACTURE SURGERY     right ankle  .  INSERTION OF DIALYSIS CATHETER Right 04/07/2016   Procedure: INSERTION OF DIALYSIS CATHETER;  Surgeon: Waynetta Sandy, MD;  Location: [place];  Service: Vascular;  Laterality: Right;  . INSERTION OF DIALYSIS CATHETER Right 04/04/2017   Procedure: INSERTION OF DIALYSIS CATHETER;  Surgeon: Waynetta Sandy, MD;  Location: [place];  Service: Vascular;  Laterality: Right;  . LIGATION OF ARTERIOVENOUS  FISTULA Left 04/14/2016   Procedure: LIGATION OF ARTERIOVENOUS  FISTULA;  Surgeon: Angelia Mould, MD;  Location: [place];  Service: Vascular;  Laterality: Left;  . PATCH ANGIOPLASTY Left 06/19/2016   Procedure: PATCH ANGIOPLASTY LEFT ARTERIOVENOUS FISTULA;  Surgeon: Angelia Mould, MD;  Location: [place];  Service: Vascular;  Laterality: Left;  . REVISON OF ARTERIOVENOUS FISTULA Left 06/19/2016   Procedure: REVISION SUPERFICIALIZATION OF BRACHIOCEPHALIC ARTERIOVENOUS FISTULA;  Surgeon: Angelia Mould, MD;  Location: [place];  Service: Vascular;  Laterality: Left;    Prior to Admission medications   Medication Sig Start Date End Date Taking? Authorizing Provider  allopurinol (ZYLOPRIM) 100 MG tablet Take 100 mg by mouth daily.   Yes [provider]  ALPRAZolam Duanne Moron) 1 MG tablet Take 1 mg by mouth 2 (two) times daily. 03/19/17  Yes [provider]  aspirin 81 MG chewable tablet Chew 1 tablet (81 mg total) by mouth daily. 10/03/13  Yes Eugenie Filler, MD  atorvastatin (LIPITOR) 40 MG tablet Take 40 mg by mouth daily.  03/14/14  Yes [provider]  lisinopril (PRINIVIL,ZESTRIL) 10 MG tablet Take 10 mg by mouth daily.   Yes [provider]  metoprolol tartrate (LOPRESSOR) 25 MG tablet Take 25 mg by mouth daily.  04/17/16  Yes [provider]  multivitamin (RENA-VIT) TABS tablet Take 1 tablet by mouth daily.   Yes [provider]  omeprazole (PRILOSEC) 40 MG capsule Take 40 mg by mouth daily.  05/31/16  Yes [provider]  senna (SENOKOT) 8.6 MG TABS tablet Take 2 tablets (17.2 mg total) by mouth at bedtime. 04/15/16  Yes Nita Sells, MD  sevelamer carbonate (RENVELA) 800 MG tablet Take 2,400-4,000 mg by mouth See admin instructions. Take 5 tablets with each meal and take 3 tablets with each snack   Yes [provider]  venlafaxine XR (EFFEXOR-XR) 75 MG 24 hr capsule Take 75 mg by mouth daily with breakfast.   Yes [provider]    Current Facility-Administered Medications  Medication Dose Route Frequency Provider Last Rate Last Dose  . acetaminophen (TYLENOL) tablet 650 mg  650 mg Oral Q6H PRN Etta Quill, DO   650 mg at 06/03/17 7619   Or  . acetaminophen (TYLENOL) suppository 650 mg  650 mg Rectal Q6H PRN Etta Quill, DO      . allopurinol (ZYLOPRIM) tablet 100 mg  100 mg Oral Daily Jennette Kettle M, DO      . ALPRAZolam Duanne Moron) tablet 1 mg  1 mg Oral BID Jennette Kettle M, DO      . atorvastatin (LIPITOR) tablet 40 mg  40 mg Oral Daily Etta Quill, DO      . Darbepoetin Alfa (ARANESP) injection 200 mcg  200 mcg Intravenous Q Thu-HD Zeyfang, David, PA-C      . doxercalciferol (HECTOROL) injection 4 mcg  4 mcg Intravenous Q T,Th,Sa-HD Zeyfang, David, PA-C      . fentaNYL (SUBLIMAZE) injection 50 mcg  50 mcg Intravenous Q2H PRN Etta Quill, DO   50 mcg at 06/03/17 0609  . lisinopril (PRINIVIL,ZESTRIL) tablet 10 mg  10 mg Oral Daily Alcario Drought, Jared M, DO      . metoprolol tartrate (LOPRESSOR) tablet 25 mg  25 mg Oral Daily Alcario Drought, Jared M, DO      . multivitamin (RENA-VIT) tablet 1 tablet  1 tablet Oral Daily Alcario Drought, Jared M, DO      . ondansetron [facility]) tablet 4 mg  4 mg Oral Q6H PRN Etta Quill, DO       Or  . ondansetron [facility]) injection 4 mg  4 mg Intravenous Q6H PRN Etta Quill, DO      . pantoprazole (PROTONIX) injection 40 mg  40 mg Intravenous Q12H Patricia Haff, MD      . sevelamer carbonate (RENVELA) tablet 2,400 mg  2,400 mg Oral BID  BM PRN Etta Quill, DO      . sevelamer carbonate (RENVELA) tablet 4,000 mg  4,000 mg Oral TID WC Etta Quill, DO   Stopped at 06/03/17 0908  . venlafaxine XR (EFFEXOR-XR) 24 hr capsule 75 mg  75 mg Oral Q breakfast Etta Quill, DO   Stopped at 06/03/17 0911   Current Outpatient Medications  Medication Sig Dispense Refill  . allopurinol (ZYLOPRIM) 100 MG tablet Take 100 mg by mouth daily.    Marland Kitchen ALPRAZolam (XANAX) 1 MG tablet Take 1 mg by mouth 2 (two) times daily.    Marland Kitchen aspirin 81 MG chewable tablet Chew  1 tablet (81 mg total) by mouth daily.    Marland Kitchen atorvastatin (LIPITOR) 40 MG tablet Take 40 mg by mouth daily.  3  . lisinopril (PRINIVIL,ZESTRIL) 10 MG tablet Take 10 mg by mouth daily.    . metoprolol tartrate (LOPRESSOR) 25 MG tablet Take 25 mg by mouth daily.     . multivitamin (RENA-VIT) TABS tablet Take 1 tablet by mouth daily.    Marland Kitchen omeprazole (PRILOSEC) 40 MG capsule Take 40 mg by mouth daily.     Marland Kitchen senna (SENOKOT) 8.6 MG TABS tablet Take 2 tablets (17.2 mg total) by mouth at bedtime. 120 each 0  . sevelamer carbonate (RENVELA) 800 MG tablet Take 2,400-4,000 mg by mouth See admin instructions. Take 5 tablets with each meal and take 3 tablets with each snack    . venlafaxine XR (EFFEXOR-XR) 75 MG 24 hr capsule Take 75 mg by mouth daily with breakfast.      Allergies as of 06/02/2017 - Review Complete 06/02/2017  Allergen Reaction Noted  . Nsaids Other (See Comments) 01/27/2013  . Tolmetin Other (See Comments) 07/20/2015    Family History  Problem Relation Age of Onset  . Hypertension Mother   . Thyroid disease Mother   . Coronary artery disease Father   . Hypertension Father   . Diabetes Father     Social History   Socioeconomic History  . Marital status: Single    Spouse name: Not on file  . Number of children: Not on file  . Years of education: Not on file  . Highest education level: Not on file  Occupational History  . Not on file  Social Needs  .  Financial resource strain: Not on file  . Food insecurity:    Worry: Not on file    Inability: Not on file  . Transportation needs:    Medical: Not on file    Non-medical: Not on file  Tobacco Use  . Smoking status: Never Smoker  . Smokeless tobacco: Never Used  Substance and Sexual Activity  . Alcohol use: No  . Drug use: No  . Sexual activity: Not Currently    Birth control/protection: Surgical  Lifestyle  . Physical activity:    Days per week: Not on file    Minutes per session: Not on file  . Stress: Not on file  Relationships  . Social connections:    Talks on phone: Not on file    Gets together: Not on file    Attends religious service: Not on file    Active member of club or organization: Not on file    Attends meetings of clubs or organizations: Not on file    Relationship status: Not on file  . Intimate partner violence:    Fear of current or ex partner: Not on file    Emotionally abused: Not on file    Physically abused: Not on file    Forced sexual activity: Not on file  Other Topics Concern  . Not on file  Social History Narrative  . Not on file    Review of Systems: as per HPI, all others negative  Physical Exam: Vital signs in last 24 hours: Temp:  [98.5 F (36.9 C)] 98.5 F (36.9 C) (05/29 1949) Pulse Rate:  [55-69] 57 (05/30 0955) Resp:  [14-23] 17 (05/30 0955) BP: (163-210)/(68-100) 179/86 (05/30 0955) SpO2:  [96 %-100 %] 100 % (05/30 0945) Weight:  [116.1 kg (256 lb)-117.7 kg (259 lb 7.7 oz)] 117.7 kg (259 lb  7.7 oz) (05/30 0955)   General:   Alert,  overweight, pleasant and cooperative in NAD Head:  Normocephalic and atraumatic. Eyes:  Sclera clear, no icterus.   Conjunctiva pink. Ears:  Normal auditory acuity. Nose:  No deformity, discharge,  or lesions. Mouth:  No deformity or lesions.  Oropharynx pink & moist. Neck:  Supple; no masses or thyromegaly. Lungs:  Clear throughout to auscultation.   No wheezes, crackles, or rhonchi. No acute  distress. Heart:  Regular rate and rhythm; no murmurs, clicks, rubs,  or gallops. Abdomen:  Soft, protuberant, mild upper abdominal tenderness, peritoneal dialysis cathether in place. No masses, hepatosplenomegaly or hernias noted. Normal bowel sounds, without guarding, and without rebound.     Msk:  Symmetrical without gross deformities. Normal posture. Pulses:  Normal pulses noted. Extremities:  Without clubbing or edema. Neurologic:  Alert and  oriented x4;  grossly normal neurologically. Skin:  Intact without significant lesions or rashes. Psych:  Alert and cooperative. Normal mood and affect.   Lab Results: Recent Labs    06/02/17 2011 06/03/17 0141  WBC 5.4 6.7  HGB 9.9* 9.8*  HCT 33.2* 33.3*  PLT 261 240   BMET Recent Labs    06/02/17 2011 06/03/17 0141  NA 142 140  K 5.0 5.8*  CL 105 109  CO2 20* 14*  GLUCOSE 86 84  BUN 85* 87*  CREATININE 17.08* 16.89*  CALCIUM 8.5* 8.3*   LFT Recent Labs    06/02/17 2011  PROT 6.2*  ALBUMIN 3.0*  AST 26  ALT 12*  ALKPHOS 51  BILITOT 0.4   PT/INR No results for input(s): LABPROT, INR in the last 72 hours.  Studies/Results: Ct Renal Stone Study  Result Date: 06/03/2017 CLINICAL DATA:  Acute onset of lower abdominal pain and melena. EXAM: CT ABDOMEN AND PELVIS WITHOUT CONTRAST TECHNIQUE: Multidetector CT imaging of the abdomen and pelvis was performed following the standard protocol without IV contrast. COMPARISON:  CT of the abdomen and pelvis from 10/01/2015, and renal ultrasound performed 04/03/2016 FINDINGS: Lower chest: The visualized lung bases are grossly clear. The visualized portions of the mediastinum are unremarkable. Hepatobiliary: Trace ascites about the liver and minimal associated free air reflects the patient's peritoneal dialysis. The liver is unremarkable in appearance. The gallbladder is within normal limits. The common bile duct is normal in caliber. Pancreas: The pancreas is within normal limits. Spleen:  The spleen is unremarkable in appearance. Trace ascites is noted about the spleen. Adrenals/Urinary Tract: The adrenal glands are unremarkable in appearance. The kidneys are within normal limits. There is no evidence of hydronephrosis. No renal or ureteral stones are identified. No perinephric stranding is seen. Stomach/Bowel: The stomach is unremarkable in appearance. The small bowel is within normal limits. The appendix is normal in caliber, without evidence of appendicitis. Scattered diverticulosis is noted along the descending and proximal sigmoid colon, without evidence of diverticulitis. Vascular/Lymphatic: The abdominal aorta is unremarkable in appearance. The inferior vena cava is grossly unremarkable. No retroperitoneal lymphadenopathy is seen. No pelvic sidewall lymphadenopathy is identified. Reproductive: The bladder is mildly distended and grossly unremarkable. The patient is status post hysterectomy. No suspicious adnexal masses are seen. The ovaries appear grossly symmetric. Other: Trace free fluid in the pelvis reflects peritoneal dialysis. The peritoneal dialysis catheter is grossly unremarkable, ending at the right hemipelvis. A small anterior abdominal wall hernia is noted superior to the umbilicus, containing only fat. Musculoskeletal: No acute osseous abnormalities are identified. The visualized musculature is unremarkable in appearance. IMPRESSION: 1.  No acute abnormality seen to explain the patient's symptoms. 2. Trace ascites within the abdomen and pelvis and minimal associated free air reflects the patient's peritoneal dialysis. 3. Scattered diverticulosis along the descending and proximal sigmoid colon, without evidence of diverticulitis. 4. Small anterior abdominal wall hernia superior to the umbilicus, containing only fat. Electronically Signed   By: Garald Balding M.D.   On: 06/03/2017 00:14   Impression:  1.  Melena. 2.  Anemia. 3.  Endstage renal disease, recently initiated  peritoneal dialysis.  Plan:  1.  Clinically doubt SBP, and don't think there's really enough fluid to tap anyway, don't think patient needs antibiotics for this reason at the present time. 2.  PPI. 3.  Clear liquid diet, NPO after midnight. 4.  Endoscopy tomorrow. 5.  Next step in management pending endoscopy findings. 6.  [facility] will follow.   LOS: 0 days   Yuliya Nova M  06/03/2017, 11:37 AM  Cell [phone] If no answer or after 5 PM call [phone]

## 2017-06-03 NOTE — ED Notes (Signed)
Requested pain medication from provider

## 2017-06-03 NOTE — ED Triage Notes (Signed)
TC to Dialysis with report of Pt admission room.on 5 [place]

## 2017-06-03 NOTE — ED Provider Notes (Signed)
[facility] EMERGENCY DEPARTMENT Provider Note   CSN: [id number] Arrival date & time: 06/02/17  1810     History   Chief Complaint Chief Complaint  Patient presents with  . GI Bleeding    HPI Patricia [name] is a 42 y.o. female.  The history is provided by the patient.  Rectal Bleeding  Quality:  Black and tarry Amount:  Moderate Timing:  Constant Chronicity:  New Context: defecation   Context: not anal fissures   Similar prior episodes: no   Relieved by:  Nothing Worsened by:  Nothing Ineffective treatments:  None tried Associated symptoms: abdominal pain   Associated symptoms: no fever and no loss of consciousness   Risk factors: no hx of IBD   Has a PD catheter that was placed in [place].  No f/c/r.    Past Medical History:  Diagnosis Date  . Acid reflux   . Anxiety   . ESRD (end stage renal disease) ([place])    [facility]- [place]  . Gout   . Headache(784.0)    "related to chemo; sometimes weekly" (09/12/2013)  . High cholesterol   . History of blood transfusion "a couple"   "related to low counts"  . Hypertension   . Iron deficiency anemia    "get epogen shots q month" (02/20/2014)  . 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 Type II diabetes mellitus ([place])    "took me off my RX ~ 04/2013"    Patient Active Problem List   Diagnosis Date Noted  . Dialysis AV fistula malfunction ([place]) 04/04/2017  . ESRD (end stage renal disease) ([place]) 04/01/2016  . Anemia due to chronic kidney disease 04/01/2016  . GERD (gastroesophageal reflux disease) 04/01/2016  . Hypomagnesemia 04/28/2015  . Acute renal failure superimposed on stage 3 chronic kidney disease ([place]) 04/12/2014  . Atypical chest pain   . Chest pain 02/19/2014  . Hypokalemia 02/19/2014  . Nephrotic syndrome 10/02/2013  . Dyslipidemia 10/02/2013  . PAT (paroxysmal atrial tachycardia) ([place]) 09/29/2013  . Symptomatic anemia  06/29/2013  . Edema 06/29/2013  . Nausea and vomiting 06/29/2013  . Fever, unspecified 01/31/2013  . Glomerulonephritis 01/28/2013  . Syncope 01/28/2013  . Chest pain with moderate risk of acute coronary syndrome 07/04/2011  . Shortness of breath 07/04/2011  . Morbid obesity ([place]) 07/04/2011  . HTN (hypertension) 07/04/2011  . DM (diabetes mellitus) ([place]) 07/04/2011  . Hypercholesterolemia 07/04/2011    Past Surgical History:  Procedure Laterality Date  . ABDOMINAL HYSTERECTOMY  2010   "laparoscopic"  . ANKLE FRACTURE SURGERY Right 1994  . AV FISTULA PLACEMENT Left 04/14/2016   Procedure: ARTERIOVENOUS (AV) FISTULA CREATION;  Surgeon: Angelia Mould, MD;  Location: [facility];  Service: Vascular;  Laterality: Left;  . CARDIAC CATHETERIZATION  2000's  . ESOPHAGOGASTRODUODENOSCOPY    . FISTULA SUPERFICIALIZATION Left 04/02/2017   Procedure: FISTULA SUPERFICIALIZATION LEFT ARM;  Surgeon: Serafina Mitchell, MD;  Location: [facility];  Service: Vascular;  Laterality: Left;  . FISTULOGRAM Left 04/07/2016   Procedure: FISTULOGRAM;  Surgeon: Waynetta Sandy, MD;  Location: [facility];  Service: Vascular;  Laterality: Left;  . FRACTURE SURGERY     right ankle  . INSERTION OF DIALYSIS CATHETER Right 04/07/2016   Procedure: INSERTION OF DIALYSIS CATHETER;  Surgeon: Waynetta Sandy, MD;  Location: [facility];  Service: Vascular;  Laterality: Right;  . INSERTION OF DIALYSIS CATHETER Right 04/04/2017   Procedure: INSERTION  OF DIALYSIS CATHETER;  Surgeon: Waynetta Sandy, MD;  Location: [facility];  Service: Vascular;  Laterality: Right;  . LIGATION OF ARTERIOVENOUS  FISTULA Left 04/14/2016   Procedure: LIGATION OF ARTERIOVENOUS  FISTULA;  Surgeon: Angelia Mould, MD;  Location: [facility];  Service: Vascular;  Laterality: Left;  . PATCH ANGIOPLASTY Left 06/19/2016   Procedure: PATCH ANGIOPLASTY LEFT ARTERIOVENOUS FISTULA;  Surgeon: Angelia Mould, MD;  Location: [facility];  Service:  Vascular;  Laterality: Left;  . REVISON OF ARTERIOVENOUS FISTULA Left 06/19/2016   Procedure: REVISION SUPERFICIALIZATION OF BRACHIOCEPHALIC ARTERIOVENOUS FISTULA;  Surgeon: Angelia Mould, MD;  Location: [facility];  Service: Vascular;  Laterality: Left;     OB History    Gravida  3   Para      Term      Preterm      AB      Living        SAB      TAB      Ectopic      Multiple  3   Live Births               Home Medications    Prior to Admission medications   Medication Sig Start Date End Date Taking? Authorizing Provider  ALPRAZolam Duanne Moron) 1 MG tablet Take 1 mg by mouth 2 (two) times daily. 03/19/17  Yes [provider]  allopurinol (ZYLOPRIM) 100 MG tablet Take 100 mg by mouth daily.    [provider]  aspirin 81 MG chewable tablet Chew 1 tablet (81 mg total) by mouth daily. 10/03/13   Eugenie Filler, MD  atorvastatin (LIPITOR) 40 MG tablet Take 40 mg by mouth daily. 03/14/14   [provider]  diphenhydramine-acetaminophen (TYLENOL PM) 25-500 MG TABS tablet Take 2 tablets by mouth at bedtime as needed (for sleep).     [provider]  HYDROcodone-acetaminophen (NORCO/VICODIN) 5-325 MG tablet Take 1 tablet by mouth every 4 (four) hours as needed for pain. Up to 5 days 05/18/17   [provider]  lisinopril (PRINIVIL,ZESTRIL) 10 MG tablet Take 10 mg by mouth daily.    [provider]  metoprolol tartrate (LOPRESSOR) 25 MG tablet Take 12.5-25 mg by mouth See admin instructions. 12.5 mg on Sun Mon Wed Fri in the mornings ( Days not at Dialysis) 25 mg every evening (Sun - Sat) 04/17/16   [provider]  multivitamin (RENA-VIT) TABS tablet Take 1 tablet by mouth daily.    [provider]  omeprazole (PRILOSEC) 40 MG capsule Take 40 mg by mouth daily.  05/31/16   [provider]  oxyCODONE-acetaminophen (PERCOCET/ROXICET) 5-325 MG tablet Take 1 tablet by mouth every 6 (six) hours as  needed. Patient taking differently: Take 1 tablet by mouth every 6 (six) hours as needed for moderate pain.  04/02/17   Ulyses Amor, PA-C  promethazine (PHENERGAN) 12.5 MG tablet Take 25 mg by mouth every 6 (six) hours as needed for nausea. 05/21/17   [provider]  senna (SENOKOT) 8.6 MG TABS tablet Take 2 tablets (17.2 mg total) by mouth at bedtime. 04/15/16   Nita Sells, MD  venlafaxine XR (EFFEXOR-XR) 37.5 MG 24 hr capsule Take 37.5 mg by mouth daily with breakfast.    [provider]    Family History Family History  Problem Relation Age of Onset  . Hypertension Mother   . Thyroid disease Mother   . Coronary artery disease Father   . Hypertension  Father   . Diabetes Father     Social History Social History   Tobacco Use  . Smoking status: Never Smoker  . Smokeless tobacco: Never Used  Substance Use Topics  . Alcohol use: No  . Drug use: No     Allergies   Nsaids and Tolmetin   Review of Systems Review of Systems  Constitutional: Negative for fever.  Respiratory: Negative for shortness of breath.   Cardiovascular: Negative for chest pain.  Gastrointestinal: Positive for abdominal pain, anal bleeding, blood in stool and hematochezia. Negative for constipation and nausea.  Genitourinary: Negative for flank pain.  Neurological: Negative for loss of consciousness.  All other systems reviewed and are negative.    Physical Exam Updated Vital Signs BP (!) 179/86   Pulse (!) 58   Temp 98.5 F (36.9 C) (Oral)   Resp (!) 21   Ht $R'5\' 7"'nr$  (1.702 m)   Wt 116.1 kg (256 lb)   SpO2 100%   BMI 40.10 kg/m   Physical Exam  Constitutional: She is oriented to person, place, and time. She appears well-developed and well-nourished.  HENT:  Head: Normocephalic and atraumatic.  Mouth/Throat: No oropharyngeal exudate.  Eyes: Pupils are equal, round, and reactive to light. Conjunctivae are normal.  Neck: Normal range of motion. Neck supple.    Cardiovascular: Normal rate, regular rhythm, normal heart sounds and intact distal pulses.  Pulmonary/Chest: Effort normal and breath sounds normal. No stridor. She has no wheezes. She has no rales.  Abdominal: Soft. Bowel sounds are normal. She exhibits no mass. There is tenderness. There is no rebound and no guarding. No hernia.  Musculoskeletal: Normal range of motion.  Neurological: She is alert and oriented to person, place, and time. She displays normal reflexes.  Skin: Skin is warm and dry. Capillary refill takes less than 2 seconds. She is not diaphoretic.  Psychiatric: She has a normal mood and affect.     ED Treatments / Results  Labs (all labs ordered are listed, but only abnormal results are displayed) Results for orders placed or performed during the hospital encounter of 06/02/17  Comprehensive metabolic panel  Result Value Ref Range   Sodium 142 135 - 145 mmol/L   Potassium 5.0 3.5 - 5.1 mmol/L   Chloride 105 101 - 111 mmol/L   CO2 20 (L) 22 - 32 mmol/L   Glucose, Bld 86 65 - 99 mg/dL   BUN 85 (H) 6 - 20 mg/dL   Creatinine, Ser 17.08 (H) 0.44 - 1.00 mg/dL   Calcium 8.5 (L) 8.9 - 10.3 mg/dL   Total Protein 6.2 (L) 6.5 - 8.1 g/dL   Albumin 3.0 (L) 3.5 - 5.0 g/dL   AST 26 15 - 41 U/L   ALT 12 (L) 14 - 54 U/L   Alkaline Phosphatase 51 38 - 126 U/L   Total Bilirubin 0.4 0.3 - 1.2 mg/dL   GFR calc non Af Amer 2 (L) >60 mL/min   GFR calc Af Amer 3 (L) >60 mL/min   Anion gap 17 (H) 5 - 15  CBC  Result Value Ref Range   WBC 5.4 4.0 - 10.5 K/uL   RBC 3.56 (L) 3.87 - 5.11 MIL/uL   Hemoglobin 9.9 (L) 12.0 - 15.0 g/dL   HCT 33.2 (L) 36.0 - 46.0 %   MCV 93.3 78.0 - 100.0 fL   MCH 27.8 26.0 - 34.0 pg   MCHC 29.8 (L) 30.0 - 36.0 g/dL   RDW 18.8 (H) 11.5 - 15.5 %  Platelets 261 150 - 400 K/uL  POC occult blood, ED  Result Value Ref Range   Fecal Occult Bld POSITIVE (A) NEGATIVE  I-Stat beta hCG blood, ED  Result Value Ref Range   I-stat hCG, quantitative <5.0 <5  mIU/mL   Comment 3          Type and screen [place]  Result Value Ref Range   ABO/RH(D) O NEG    Antibody Screen NEG    Sample Expiration      06/05/2017 Performed at [facility], [place] [address] [place], [place] [address]    Ct Renal Stone Study  Result Date: 06/03/2017 CLINICAL DATA:  Acute onset of lower abdominal pain and melena. EXAM: CT ABDOMEN AND PELVIS WITHOUT CONTRAST TECHNIQUE: Multidetector CT imaging of the abdomen and pelvis was performed following the standard protocol without IV contrast. COMPARISON:  CT of the abdomen and pelvis from 10/01/2015, and renal ultrasound performed 04/03/2016 FINDINGS: Lower chest: The visualized lung bases are grossly clear. The visualized portions of the mediastinum are unremarkable. Hepatobiliary: Trace ascites about the liver and minimal associated free air reflects the patient's peritoneal dialysis. The liver is unremarkable in appearance. The gallbladder is within normal limits. The common bile duct is normal in caliber. Pancreas: The pancreas is within normal limits. Spleen: The spleen is unremarkable in appearance. Trace ascites is noted about the spleen. Adrenals/Urinary Tract: The adrenal glands are unremarkable in appearance. The kidneys are within normal limits. There is no evidence of hydronephrosis. No renal or ureteral stones are identified. No perinephric stranding is seen. Stomach/Bowel: The stomach is unremarkable in appearance. The small bowel is within normal limits. The appendix is normal in caliber, without evidence of appendicitis. Scattered diverticulosis is noted along the descending and proximal sigmoid colon, without evidence of diverticulitis. Vascular/Lymphatic: The abdominal aorta is unremarkable in appearance. The inferior vena cava is grossly unremarkable. No retroperitoneal lymphadenopathy is seen. No pelvic sidewall lymphadenopathy is identified. Reproductive: The bladder is mildly distended and  grossly unremarkable. The patient is status post hysterectomy. No suspicious adnexal masses are seen. The ovaries appear grossly symmetric. Other: Trace free fluid in the pelvis reflects peritoneal dialysis. The peritoneal dialysis catheter is grossly unremarkable, ending at the right hemipelvis. A small anterior abdominal wall hernia is noted superior to the umbilicus, containing only fat. Musculoskeletal: No acute osseous abnormalities are identified. The visualized musculature is unremarkable in appearance. IMPRESSION: 1. No acute abnormality seen to explain the patient's symptoms. 2. Trace ascites within the abdomen and pelvis and minimal associated free air reflects the patient's peritoneal dialysis. 3. Scattered diverticulosis along the descending and proximal sigmoid colon, without evidence of diverticulitis. 4. Small anterior abdominal wall hernia superior to the umbilicus, containing only fat. Electronically Signed   By: Garald Balding M.D.   On: 06/03/2017 00:14   EKG EKG Interpretation  Date/Time:  Wednesday Jun 02 2017 20:18:02 EDT Ventricular Rate:  63 PR Interval:  152 QRS Duration: 88 QT Interval:  418 QTC Calculation: 427 R Axis:   60 Text Interpretation:  Normal sinus rhythm Confirmed by Randal Buba, Aniqua Briere ([id number]) on 06/02/2017 11:04:05 PM   Radiology Ct Renal Stone Study  Result Date: 06/03/2017 CLINICAL DATA:  Acute onset of lower abdominal pain and melena. EXAM: CT ABDOMEN AND PELVIS WITHOUT CONTRAST TECHNIQUE: Multidetector CT imaging of the abdomen and pelvis was performed following the standard protocol without IV contrast. COMPARISON:  CT of the abdomen and pelvis from 10/01/2015, and renal ultrasound performed  04/03/2016 FINDINGS: Lower chest: The visualized lung bases are grossly clear. The visualized portions of the mediastinum are unremarkable. Hepatobiliary: Trace ascites about the liver and minimal associated free air reflects the patient's peritoneal dialysis. The liver is  unremarkable in appearance. The gallbladder is within normal limits. The common bile duct is normal in caliber. Pancreas: The pancreas is within normal limits. Spleen: The spleen is unremarkable in appearance. Trace ascites is noted about the spleen. Adrenals/Urinary Tract: The adrenal glands are unremarkable in appearance. The kidneys are within normal limits. There is no evidence of hydronephrosis. No renal or ureteral stones are identified. No perinephric stranding is seen. Stomach/Bowel: The stomach is unremarkable in appearance. The small bowel is within normal limits. The appendix is normal in caliber, without evidence of appendicitis. Scattered diverticulosis is noted along the descending and proximal sigmoid colon, without evidence of diverticulitis. Vascular/Lymphatic: The abdominal aorta is unremarkable in appearance. The inferior vena cava is grossly unremarkable. No retroperitoneal lymphadenopathy is seen. No pelvic sidewall lymphadenopathy is identified. Reproductive: The bladder is mildly distended and grossly unremarkable. The patient is status post hysterectomy. No suspicious adnexal masses are seen. The ovaries appear grossly symmetric. Other: Trace free fluid in the pelvis reflects peritoneal dialysis. The peritoneal dialysis catheter is grossly unremarkable, ending at the right hemipelvis. A small anterior abdominal wall hernia is noted superior to the umbilicus, containing only fat. Musculoskeletal: No acute osseous abnormalities are identified. The visualized musculature is unremarkable in appearance. IMPRESSION: 1. No acute abnormality seen to explain the patient's symptoms. 2. Trace ascites within the abdomen and pelvis and minimal associated free air reflects the patient's peritoneal dialysis. 3. Scattered diverticulosis along the descending and proximal sigmoid colon, without evidence of diverticulitis. 4. Small anterior abdominal wall hernia superior to the umbilicus, containing only fat.  Electronically Signed   By: Garald Balding M.D.   On: 06/03/2017 00:14    Procedures Procedures (including critical care time)  Medications Ordered in ED Medications  fentaNYL (SUBLIMAZE) injection 50 mcg (has no administration in time range)       Final Clinical Impressions(s) / ED Diagnoses   Final diagnoses:  Rectal bleeding    Admit to medicine.        Dacey Milberger, MD 06/03/17 [phone]

## 2017-06-03 NOTE — H&P (Signed)
History and Physical    Patricia [name] NWG:[id number] DOB: 03-07-1975 DOA: 06/02/2017  PCP: Blair Heys, PA-C  Patient coming from: Home  I have personally briefly reviewed patient's old medical records in [place]  Chief Complaint: Melena  HPI: Patricia [name] is a 42 y.o. 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  Patient presents to the ED with c/o ~3 day history of black tarry stool associated with abd pain in lower abdomen onset around the same time.  Different than ABD pain from PD catheter she had earlier this month that resolved after revision surgery on 5/14.  ED Course: Patient with frank melena on exam as well as hemoccult positive.  HGB is actually up from prior lab values at 9.9.  No SIRS.  K 5.0, BUN 85.   Review of Systems: As per HPI otherwise 10 point review of systems negative.   Past Medical History:  Diagnosis Date  . Acid reflux   . Anxiety   . ESRD (end stage renal disease) ([place])    [facility]- [place]  . Gout   . Headache(784.0)    "related to chemo; sometimes weekly" (09/12/2013)  . High cholesterol   . History of blood transfusion "a couple"   "related to low counts"  . Hypertension   . Iron deficiency anemia    "get epogen shots q month" (02/20/2014)  . 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 Type II diabetes mellitus ([place])    "took me off my RX ~ 04/2013"    Past Surgical History:  Procedure Laterality Date  . ABDOMINAL HYSTERECTOMY  2010   "laparoscopic"  . ANKLE FRACTURE SURGERY Right 1994  . AV FISTULA PLACEMENT Left 04/14/2016   Procedure: ARTERIOVENOUS (AV) FISTULA CREATION;  Surgeon: Angelia Mould, MD;  Location: [facility];  Service: Vascular;  Laterality: Left;  . CARDIAC CATHETERIZATION  2000's  . ESOPHAGOGASTRODUODENOSCOPY    . FISTULA SUPERFICIALIZATION Left 04/02/2017     Procedure: FISTULA SUPERFICIALIZATION LEFT ARM;  Surgeon: Serafina Mitchell, MD;  Location: [facility];  Service: Vascular;  Laterality: Left;  . FISTULOGRAM Left 04/07/2016   Procedure: FISTULOGRAM;  Surgeon: Waynetta Sandy, MD;  Location: [facility];  Service: Vascular;  Laterality: Left;  . FRACTURE SURGERY     right ankle  . INSERTION OF DIALYSIS CATHETER Right 04/07/2016   Procedure: INSERTION OF DIALYSIS CATHETER;  Surgeon: Waynetta Sandy, MD;  Location: [facility];  Service: Vascular;  Laterality: Right;  . INSERTION OF DIALYSIS CATHETER Right 04/04/2017   Procedure: INSERTION OF DIALYSIS CATHETER;  Surgeon: Waynetta Sandy, MD;  Location: [facility];  Service: Vascular;  Laterality: Right;  . LIGATION OF ARTERIOVENOUS  FISTULA Left 04/14/2016   Procedure: LIGATION OF ARTERIOVENOUS  FISTULA;  Surgeon: Angelia Mould, MD;  Location: [facility];  Service: Vascular;  Laterality: Left;  . PATCH ANGIOPLASTY Left 06/19/2016   Procedure: PATCH ANGIOPLASTY LEFT ARTERIOVENOUS FISTULA;  Surgeon: Angelia Mould, MD;  Location: [facility];  Service: Vascular;  Laterality: Left;  . REVISON OF ARTERIOVENOUS FISTULA Left 06/19/2016   Procedure: REVISION SUPERFICIALIZATION OF BRACHIOCEPHALIC ARTERIOVENOUS FISTULA;  Surgeon: Angelia Mould, MD;  Location: [facility];  Service: Vascular;  Laterality: Left;     reports that she has never smoked. She has never used smokeless tobacco. She reports that she  does not drink alcohol or use drugs.  Allergies  Allergen Reactions  . Nsaids Other (See Comments)    Cannot take due to Kidney disease Kidney disease Kidney function   . Tolmetin Other (See Comments)    Cannot take due to Kidney Disease    Family History  Problem Relation Age of Onset  . Hypertension Mother   . Thyroid disease Mother   . Coronary artery disease Father   . Hypertension Father   . Diabetes Father      Prior to Admission medications   Medication Sig Start Date  End Date Taking? Authorizing Provider  allopurinol (ZYLOPRIM) 100 MG tablet Take 100 mg by mouth daily.   Yes [provider]  ALPRAZolam Duanne Moron) 1 MG tablet Take 1 mg by mouth 2 (two) times daily. 03/19/17  Yes [provider]  aspirin 81 MG chewable tablet Chew 1 tablet (81 mg total) by mouth daily. 10/03/13  Yes Eugenie Filler, MD  atorvastatin (LIPITOR) 40 MG tablet Take 40 mg by mouth daily. 03/14/14  Yes [provider]  lisinopril (PRINIVIL,ZESTRIL) 10 MG tablet Take 10 mg by mouth daily.   Yes [provider]  metoprolol tartrate (LOPRESSOR) 25 MG tablet Take 25 mg by mouth daily.  04/17/16  Yes [provider]  multivitamin (RENA-VIT) TABS tablet Take 1 tablet by mouth daily.   Yes [provider]  omeprazole (PRILOSEC) 40 MG capsule Take 40 mg by mouth daily.  05/31/16  Yes [provider]  senna (SENOKOT) 8.6 MG TABS tablet Take 2 tablets (17.2 mg total) by mouth at bedtime. 04/15/16  Yes Nita Sells, MD  sevelamer carbonate (RENVELA) 800 MG tablet Take 2,400-4,000 mg by mouth See admin instructions. Take 5 tablets with each meal and take 3 tablets with each snack   Yes [provider]  venlafaxine XR (EFFEXOR-XR) 75 MG 24 hr capsule Take 75 mg by mouth daily with breakfast.   Yes [provider]    Physical Exam: Vitals:   06/02/17 1947 06/02/17 2151 06/02/17 2245 06/02/17 2300  BP: (!) 210/100 (!) 189/76 (!) 177/91 (!) 179/86  Pulse: 69 66 (!) 57 (!) 58  Resp: $Remo'18 18 15 'xycIh$ (!) 21  Temp: 98.5 F (36.9 C)     TempSrc: Oral     SpO2: 100% 100% 98% 100%  Weight: 116.1 kg (256 lb)     Height: $Remove'5\' 7"'yKyWhTL$  (1.702 m)       Constitutional: NAD, calm, comfortable Eyes: PERRL, lids and conjunctivae normal ENMT: Mucous membranes are moist. Posterior pharynx clear of any exudate or lesions.Normal dentition.  Neck: normal, supple, no masses, no thyromegaly Respiratory: clear to auscultation bilaterally, no  wheezing, no crackles. Normal respiratory effort. No accessory muscle use.  Cardiovascular: Regular rate and rhythm, no murmurs / rubs / gallops. No extremity edema. 2+ pedal pulses. No carotid bruits.  Abdomen: Lower abdominal tenderness, no erythema or cellulitis.  No rebound, no guarding. Musculoskeletal: no clubbing / cyanosis. No joint deformity upper and lower extremities. Good ROM, no contractures. Normal muscle tone.  Skin: no rashes, lesions, ulcers. No induration Neurologic: CN 2-12 grossly intact. Sensation intact, DTR normal. Strength 5/5 in all 4.  Psychiatric: Normal judgment and insight. Alert and oriented x 3. Normal mood.    Labs on Admission: I have personally reviewed following labs and imaging studies  CBC: Recent Labs  Lab 06/02/17 2011  WBC 5.4  HGB 9.9*  HCT 33.2*  MCV 93.3  PLT 261  Basic Metabolic Panel: Recent Labs  Lab 06/02/17 2011  NA 142  K 5.0  CL 105  CO2 20*  GLUCOSE 86  BUN 85*  CREATININE 17.08*  CALCIUM 8.5*   GFR: Estimated Creatinine Clearance: 5.6 mL/min (A) (by C-G formula based on SCr of 17.08 mg/dL (H)). Liver Function Tests: Recent Labs  Lab 06/02/17 2011  AST 26  ALT 12*  ALKPHOS 51  BILITOT 0.4  PROT 6.2*  ALBUMIN 3.0*   No results for input(s): LIPASE, AMYLASE in the last 168 hours. No results for input(s): AMMONIA in the last 168 hours. Coagulation Profile: No results for input(s): INR, PROTIME in the last 168 hours. Cardiac Enzymes: No results for input(s): CKTOTAL, CKMB, CKMBINDEX, TROPONINI in the last 168 hours. BNP (last 3 results) No results for input(s): PROBNP in the last 8760 hours. HbA1C: No results for input(s): HGBA1C in the last 72 hours. CBG: No results for input(s): GLUCAP in the last 168 hours. Lipid Profile: No results for input(s): CHOL, HDL, LDLCALC, TRIG, CHOLHDL, LDLDIRECT in the last 72 hours. Thyroid Function Tests: No results for input(s): TSH, T4TOTAL, FREET4, T3FREE, THYROIDAB in  the last 72 hours. Anemia Panel: No results for input(s): VITAMINB12, FOLATE, FERRITIN, TIBC, IRON, RETICCTPCT in the last 72 hours. Urine analysis:    Component Value Date/Time   COLORURINE YELLOW 04/03/2016 1626   APPEARANCEUR HAZY (A) 04/03/2016 1626   LABSPEC 1.012 04/03/2016 1626   PHURINE 6.0 04/03/2016 1626   GLUCOSEU 150 (A) 04/03/2016 1626   HGBUR NEGATIVE 04/03/2016 1626   BILIRUBINUR NEGATIVE 04/03/2016 1626   KETONESUR NEGATIVE 04/03/2016 1626   PROTEINUR >=300 (A) 04/03/2016 1626   UROBILINOGEN 0.2 09/19/2014 1620   NITRITE NEGATIVE 04/03/2016 1626   LEUKOCYTESUR NEGATIVE 04/03/2016 1626    Radiological Exams on Admission: Ct Renal Stone Study  Result Date: 06/03/2017 CLINICAL DATA:  Acute onset of lower abdominal pain and melena. EXAM: CT ABDOMEN AND PELVIS WITHOUT CONTRAST TECHNIQUE: Multidetector CT imaging of the abdomen and pelvis was performed following the standard protocol without IV contrast. COMPARISON:  CT of the abdomen and pelvis from 10/01/2015, and renal ultrasound performed 04/03/2016 FINDINGS: Lower chest: The visualized lung bases are grossly clear. The visualized portions of the mediastinum are unremarkable. Hepatobiliary: Trace ascites about the liver and minimal associated free air reflects the patient's peritoneal dialysis. The liver is unremarkable in appearance. The gallbladder is within normal limits. The common bile duct is normal in caliber. Pancreas: The pancreas is within normal limits. Spleen: The spleen is unremarkable in appearance. Trace ascites is noted about the spleen. Adrenals/Urinary Tract: The adrenal glands are unremarkable in appearance. The kidneys are within normal limits. There is no evidence of hydronephrosis. No renal or ureteral stones are identified. No perinephric stranding is seen. Stomach/Bowel: The stomach is unremarkable in appearance. The small bowel is within normal limits. The appendix is normal in caliber, without evidence  of appendicitis. Scattered diverticulosis is noted along the descending and proximal sigmoid colon, without evidence of diverticulitis. Vascular/Lymphatic: The abdominal aorta is unremarkable in appearance. The inferior vena cava is grossly unremarkable. No retroperitoneal lymphadenopathy is seen. No pelvic sidewall lymphadenopathy is identified. Reproductive: The bladder is mildly distended and grossly unremarkable. The patient is status post hysterectomy. No suspicious adnexal masses are seen. The ovaries appear grossly symmetric. Other: Trace free fluid in the pelvis reflects peritoneal dialysis. The peritoneal dialysis catheter is grossly unremarkable, ending at the right hemipelvis. A small anterior abdominal wall hernia is noted superior to the  umbilicus, containing only fat. Musculoskeletal: No acute osseous abnormalities are identified. The visualized musculature is unremarkable in appearance. IMPRESSION: 1. No acute abnormality seen to explain the patient's symptoms. 2. Trace ascites within the abdomen and pelvis and minimal associated free air reflects the patient's peritoneal dialysis. 3. Scattered diverticulosis along the descending and proximal sigmoid colon, without evidence of diverticulitis. 4. Small anterior abdominal wall hernia superior to the umbilicus, containing only fat. Electronically Signed   By: Garald Balding M.D.   On: 06/03/2017 00:14    EKG: Independently reviewed.  Assessment/Plan Principal Problem:   Gastrointestinal hemorrhage with melena Active Problems:   HTN (hypertension)   DM (diabetes mellitus) (HCC)   ESRD (end stage renal disease) ([facility])   Abdominal pain    1. Melena - 1. Repeat CBC in AM 2. NPO except sips with meds 3. Protonix $RemoveBef'80mg'BdpahdjkUi$  PO BID first dose now 4. Call GI in AM for probable EGD 5. Hold ASA 2. ESRD on PD - 1. Spoke with Dr. Joelyn Oms on phone, nephrology will see patient in AM. 3. Abd pain - 1. Onset around same time as melena 2. Did alert Dr.  Joelyn Oms to presence of abd pain 3. No SIRS 4. SBP possible but seems less likely given the onset of melena at around the same time. 5. Holding off on ABx for now. 4. DM - 1. Listed in chart but doesn't appear to be on any specific meds for this. 5. HTN - continue home BP meds (bp RUNNING 329J-188C systolic in ED)  DVT prophylaxis: SCDs Code Status: Full Family Communication: No family in room Disposition Plan: Home after admit Consults called: Spoke with Dr. Joelyn Oms on phone, call GI in AM Admission status: Admit to inpatient   Etta Quill DO [facility] Pager [phone]  If 7AM-7PM, please contact day team taking care of patient [email or web address] Password [email or web address]  06/03/2017, 1:34 AM

## 2017-06-03 NOTE — Progress Notes (Signed)
[facility] PROGRESS NOTE  Patricia [name] NLZ:[id number] DOB: February 09, 1975 DOA: 06/02/2017  PCP: Blair Heys, PA-C  Brief History/Interval Summary: 42 year old African-American female with a past medical history of ESRD recently started on peritoneal dialysis this past week, history of hypertension and diabetes presented with a 3-day history of black stools.  Concern was for GI bleed.  She was hospitalized for further management.  Reason for Visit: Upper GI bleed  Consultants: [facility]  Procedures: None yet  Antibiotics: None  Subjective/Interval History: Patient complains of abdominal pain which is diffuse.  5 out of 10 in intensity.  No fever.  No nausea or vomiting.  Has not had any bloody emesis.  ROS: Denies any shortness of breath.  Objective:  Vital Signs  Vitals:   06/03/17 0900 06/03/17 0915 06/03/17 0945 06/03/17 0955  BP: (!) 175/87 (!) 182/88 (!) 188/89 (!) 179/86  Pulse: (!) 56 (!) 55 (!) 55 (!) 57  Resp: $Remo'15  19 17  'UTQUP$ Temp:      TempSrc:      SpO2: 100% 100% 100%   Weight:    117.7 kg (259 lb 7.7 oz)  Height:        Intake/Output Summary (Last 24 hours) at 06/03/2017 1130 Last data filed at 06/03/2017 0955 Gross per 24 hour  Intake 0 ml  Output 1000 ml  Net -1000 ml   Filed Weights   06/02/17 1947 06/03/17 0955  Weight: 116.1 kg (256 lb) 117.7 kg (259 lb 7.7 oz)    General appearance: alert, cooperative, appears stated age and no distress Head: Normocephalic, without obvious abnormality, atraumatic Resp: Diminished air entry at the bases.  No wheezing rales or rhonchi.  Normal effort at rest Cardio: regular rate and rhythm, S1, S2 normal, no murmur, click, rub or gallop GI: Abdomen is obese.  PD catheter is noted.  Diffusely tender without any rebound rigidity or guarding.  No masses or organomegaly. Extremities: extremities normal, atraumatic, no cyanosis or edema Neurologic: Alert and oriented x3.  No focal neurological  deficits.  Lab Results:  Data Reviewed: I have personally reviewed following labs and imaging studies  CBC: Recent Labs  Lab 06/02/17 2011 06/03/17 0141  WBC 5.4 6.7  HGB 9.9* 9.8*  HCT 33.2* 33.3*  MCV 93.3 93.3  PLT 261 902    Basic Metabolic Panel: Recent Labs  Lab 06/02/17 2011 06/03/17 0141  NA 142 140  K 5.0 5.8*  CL 105 109  CO2 20* 14*  GLUCOSE 86 84  BUN 85* 87*  CREATININE 17.08* 16.89*  CALCIUM 8.5* 8.3*    GFR: Estimated Creatinine Clearance: 5.7 mL/min (A) (by C-G formula based on SCr of 16.89 mg/dL (H)).  Liver Function Tests: Recent Labs  Lab 06/02/17 2011  AST 26  ALT 12*  ALKPHOS 51  BILITOT 0.4  PROT 6.2*  ALBUMIN 3.0*      Radiology Studies: Ct Renal Stone Study  Result Date: 06/03/2017 CLINICAL DATA:  Acute onset of lower abdominal pain and melena. EXAM: CT ABDOMEN AND PELVIS WITHOUT CONTRAST TECHNIQUE: Multidetector CT imaging of the abdomen and pelvis was performed following the standard protocol without IV contrast. COMPARISON:  CT of the abdomen and pelvis from 10/01/2015, and renal ultrasound performed 04/03/2016 FINDINGS: Lower chest: The visualized lung bases are grossly clear. The visualized portions of the mediastinum are unremarkable. Hepatobiliary: Trace ascites about the liver and minimal associated free air reflects the patient's peritoneal dialysis. The liver is unremarkable in appearance. The gallbladder is within normal  limits. The common bile duct is normal in caliber. Pancreas: The pancreas is within normal limits. Spleen: The spleen is unremarkable in appearance. Trace ascites is noted about the spleen. Adrenals/Urinary Tract: The adrenal glands are unremarkable in appearance. The kidneys are within normal limits. There is no evidence of hydronephrosis. No renal or ureteral stones are identified. No perinephric stranding is seen. Stomach/Bowel: The stomach is unremarkable in appearance. The small bowel is within normal  limits. The appendix is normal in caliber, without evidence of appendicitis. Scattered diverticulosis is noted along the descending and proximal sigmoid colon, without evidence of diverticulitis. Vascular/Lymphatic: The abdominal aorta is unremarkable in appearance. The inferior vena cava is grossly unremarkable. No retroperitoneal lymphadenopathy is seen. No pelvic sidewall lymphadenopathy is identified. Reproductive: The bladder is mildly distended and grossly unremarkable. The patient is status post hysterectomy. No suspicious adnexal masses are seen. The ovaries appear grossly symmetric. Other: Trace free fluid in the pelvis reflects peritoneal dialysis. The peritoneal dialysis catheter is grossly unremarkable, ending at the right hemipelvis. A small anterior abdominal wall hernia is noted superior to the umbilicus, containing only fat. Musculoskeletal: No acute osseous abnormalities are identified. The visualized musculature is unremarkable in appearance. IMPRESSION: 1. No acute abnormality seen to explain the patient's symptoms. 2. Trace ascites within the abdomen and pelvis and minimal associated free air reflects the patient's peritoneal dialysis. 3. Scattered diverticulosis along the descending and proximal sigmoid colon, without evidence of diverticulitis. 4. Small anterior abdominal wall hernia superior to the umbilicus, containing only fat. Electronically Signed   By: Garald Balding M.D.   On: 06/03/2017 00:14     Medications:  Scheduled: . allopurinol  100 mg Oral Daily  . ALPRAZolam  1 mg Oral BID  . atorvastatin  40 mg Oral Daily  . darbepoetin (ARANESP) injection - DIALYSIS  200 mcg Intravenous Q Thu-HD  . doxercalciferol  4 mcg Intravenous Q T,Th,Sa-HD  . lisinopril  10 mg Oral Daily  . metoprolol tartrate  25 mg Oral Daily  . multivitamin  1 tablet Oral Daily  . pantoprazole  40 mg Intravenous Q12H  . sevelamer carbonate  4,000 mg Oral TID WC  . venlafaxine XR  75 mg Oral Q  breakfast   Continuous:  [id number] **OR** acetaminophen, fentaNYL (SUBLIMAZE) injection, ondansetron **OR** ondansetron (ZOFRAN) IV, sevelamer carbonate  Assessment/Plan:    Melanotic stools/upper GI bleed Concern is for GI bleeding.  However patient's hemoglobin is stable.  Continue with PPI for now.  Continue n.p.o. status.  Discussed with the gastroenterology.  They will consult on this patient.  Will likely need to undergo EGD at some point in time.  Patient does mention a history of acid reflux.  Denies any NSAID use.  No alcohol use.  Abdominal pain Etiology is unclear.  No significant abnormalities noted on CT scan.  Patient is on peritoneal dialysis and so peritonitis is always a possibility.  Cell counts are pending.  End-stage renal disease on peritoneal dialysis Nephrology to follow.  Potassium noted to be elevated.  Nephrology to address.  Essential hypertension Blood pressure is suboptimally controlled.  Continue home medications.  Some of the elevation in BP could be secondary to pain.  Normocytic anemia, likely anemia of chronic disease Hemoglobin is stable.  Continue to monitor.  DVT Prophylaxis: SCDs    Code Status: Full Code Family Communication: Discussed with patient Disposition Plan: Management as outlined above.  Mobilize as tolerated.    LOS: 0 days   [employer]  [facility] Pager [phone] 06/03/2017, 11:30 AM  If 7PM-7AM, please contact night-coverage at [email or web address], password [facility]

## 2017-06-03 NOTE — Consult Note (Addendum)
[facility] Renal Consultation Note  Indication for Consultation:  Management of ESRD/hemodialysis; anemia, hypertension/volume and secondary hyperparathyroidism  HPI: Patricia [name] is a 42 y.o. 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     Came to ER  secondary history of Black tar[place] stools and abdominal pain Denies problems with PD exchanges and has clear pd fluid with exchanges Denies fevers, chills, N/V. Noted PD cath inserted  in April 2019 and needed revision 05/18/17 secondary to positional pain .The procedure resolved her PD  Cath pain and no reported problems with training just started doing CCPD at home last week. She still has a R IJ perm cath  And LUA AVF  ( 04/02/17 Superficialization  surgery /and  not using 2 needles at [place] yet .   In ER now not in distress ,K 5.8 HGB 9.8  BUN 87 Scr 16.89  Co2 14. We are consulted for ESRD/ dialysis needs.  will Plan  for HD today using Permcath and obtain PD fluid for analysis cell count . Appears slightly uremic with SCr up and co2 low / Noted HTN also .  per Home TRAINING PD RN Patient still in training at home doing exchanges with supervision.      Past Medical History:  Diagnosis Date  . Acid reflux   . Anxiety   . ESRD (end stage renal disease) ([place])    [facility]SAT- [place]  . Gout   . Headache(784.0)    "related to chemo; sometimes weekly" (09/12/2013)  . High cholesterol   . History of blood transfusion "a couple"   "related to low counts"  . Hypertension   . Iron deficiency anemia    "get epogen shots q month" (02/20/2014)  . 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 Type II diabetes mellitus ([place])    "took me off my RX ~ 04/2013"    Past Surgical History:  Procedure Laterality Date  . ABDOMINAL HYSTERECTOMY  2010   "laparoscopic"  . ANKLE FRACTURE  SURGERY Right 1994  . AV FISTULA PLACEMENT Left 04/14/2016   Procedure: ARTERIOVENOUS (AV) FISTULA CREATION;  Surgeon: Angelia Mould, MD;  Location: [facility];  Service: Vascular;  Laterality: Left;  . CARDIAC CATHETERIZATION  2000's  . ESOPHAGOGASTRODUODENOSCOPY    . FISTULA SUPERFICIALIZATION Left 04/02/2017   Procedure: FISTULA SUPERFICIALIZATION LEFT ARM;  Surgeon: Serafina Mitchell, MD;  Location: [facility];  Service: Vascular;  Laterality: Left;  . FISTULOGRAM Left 04/07/2016   Procedure: FISTULOGRAM;  Surgeon: Waynetta Sandy, MD;  Location: [facility];  Service: Vascular;  Laterality: Left;  . FRACTURE SURGERY     right ankle  . INSERTION OF DIALYSIS CATHETER Right 04/07/2016   Procedure: INSERTION OF DIALYSIS CATHETER;  Surgeon: Waynetta Sandy, MD;  Location: [facility];  Service: Vascular;  Laterality: Right;  . INSERTION OF DIALYSIS CATHETER Right 04/04/2017   Procedure: INSERTION OF DIALYSIS CATHETER;  Surgeon: Waynetta Sandy, MD;  Location: [facility];  Service: Vascular;  Laterality: Right;  . LIGATION OF ARTERIOVENOUS  FISTULA Left 04/14/2016   Procedure: LIGATION OF ARTERIOVENOUS  FISTULA;  Surgeon: Angelia Mould, MD;  Location: [facility];  Service: Vascular;  Laterality: Left;  . PATCH ANGIOPLASTY Left 06/19/2016   Procedure: PATCH ANGIOPLASTY LEFT ARTERIOVENOUS FISTULA;  Surgeon: Angelia Mould, MD;  [facility];  Service: Vascular;  Laterality: Left;  . REVISON OF ARTERIOVENOUS FISTULA Left 06/19/2016   Procedure: REVISION SUPERFICIALIZATION OF BRACHIOCEPHALIC ARTERIOVENOUS FISTULA;  Surgeon: Angelia Mould, MD;  Location: [facility] OR;  Service: Vascular;  Laterality: Left;      Family History  Problem Relation Age of Onset  . Hypertension Mother   . Thyroid disease Mother   . Coronary artery disease Father   . Hypertension Father   . Diabetes Father       reports that she has never smoked. She has never used smokeless tobacco. She reports  that she does not drink alcohol or use drugs.   Allergies  Allergen Reactions  . Nsaids Other (See Comments)    Cannot take due to Kidney disease Kidney disease Kidney function   . Tolmetin Other (See Comments)    Cannot take due to Kidney Disease    Prior to Admission medications   Medication Sig Start Date End Date Taking? Authorizing Provider  allopurinol (ZYLOPRIM) 100 MG tablet Take 100 mg by mouth daily.   Yes [provider]  ALPRAZolam Duanne Moron) 1 MG tablet Take 1 mg by mouth 2 (two) times daily. 03/19/17  Yes [provider]  aspirin 81 MG chewable tablet Chew 1 tablet (81 mg total) by mouth daily. 10/03/13  Yes Eugenie Filler, MD  atorvastatin (LIPITOR) 40 MG tablet Take 40 mg by mouth daily. 03/14/14  Yes [provider]  lisinopril (PRINIVIL,ZESTRIL) 10 MG tablet Take 10 mg by mouth daily.   Yes [provider]  metoprolol tartrate (LOPRESSOR) 25 MG tablet Take 25 mg by mouth daily.  04/17/16  Yes [provider]  multivitamin (RENA-VIT) TABS tablet Take 1 tablet by mouth daily.   Yes [provider]  omeprazole (PRILOSEC) 40 MG capsule Take 40 mg by mouth daily.  05/31/16  Yes [provider]  senna (SENOKOT) 8.6 MG TABS tablet Take 2 tablets (17.2 mg total) by mouth at bedtime. 04/15/16  Yes Nita Sells, MD  sevelamer carbonate (RENVELA) 800 MG tablet Take 2,400-4,000 mg by mouth See admin instructions. Take 5 tablets with each meal and take 3 tablets with each snack   Yes [provider]  venlafaxine XR (EFFEXOR-XR) 75 MG 24 hr capsule Take 75 mg by mouth daily with breakfast.   Yes [provider]     Anti-infectives (From admission, onward)   None      Results for orders placed or performed during the hospital encounter of 06/02/17 (from the past 48 hour(s))  POC occult blood, ED     Status: Abnormal   Collection Time: 06/02/17  8:05 PM  Result Value Ref Range   Fecal Occult Bld  POSITIVE (A) NEGATIVE  Type and screen [place]     Status: None   Collection Time: 06/02/17  8:08 PM  Result Value Ref Range   ABO/RH(D) O NEG    Antibody Screen NEG    Sample Expiration      06/05/2017 Performed at [facility], [place] [address], [place], [place] [address]   Comprehensive metabolic panel     Status: Abnormal   Collection Time: 06/02/17  8:11 PM  Result Value Ref Range   Sodium 142 135 - 145 mmol/L   Potassium 5.0 3.5 - 5.1 mmol/L   Chloride 105 101 - 111 mmol/L   CO2 20 (L) 22 - 32 mmol/L   Glucose, Bld 86 65 - 99 mg/dL  BUN 85 (H) 6 - 20 mg/dL   Creatinine, Ser 17.08 (H) 0.44 - 1.00 mg/dL   Calcium 8.5 (L) 8.9 - 10.3 mg/dL   Total Protein 6.2 (L) 6.5 - 8.1 g/dL   Albumin 3.0 (L) 3.5 - 5.0 g/dL   AST 26 15 - 41 U/L   ALT 12 (L) 14 - 54 U/L   Alkaline Phosphatase 51 38 - 126 U/L   Total Bilirubin 0.4 0.3 - 1.2 mg/dL   GFR calc non Af Amer 2 (L) >60 mL/min   GFR calc Af Amer 3 (L) >60 mL/min    Comment: (NOTE) The eGFR has been calculated using the CKD EPI equation. This calculation has not been validated in all clinical situations. eGFR's persistently <60 mL/min signify possible Chronic Kidney Disease.    Anion gap 17 (H) 5 - 15    Comment: Performed at [facility], [place] [address] [place], [place] [address]  CBC     Status: Abnormal   Collection Time: 06/02/17  8:11 PM  Result Value Ref Range   WBC 5.4 4.0 - 10.5 K/uL   RBC 3.56 (L) 3.87 - 5.11 MIL/uL   Hemoglobin 9.9 (L) 12.0 - 15.0 g/dL   HCT 33.2 (L) 36.0 - 46.0 %   MCV 93.3 78.0 - 100.0 fL   MCH 27.8 26.0 - 34.0 pg   MCHC 29.8 (L) 30.0 - 36.0 g/dL   RDW 18.8 (H) 11.5 - 15.5 %   Platelets 261 150 - 400 K/uL    Comment: Performed at [facility] [address] [place], [place] [address]  I-Stat beta hCG blood, ED     Status: None   Collection Time: 06/02/17  8:23 PM  Result Value Ref Range   I-stat hCG, quantitative <5.0 <5 mIU/mL   Comment 3             Comment:   GEST. AGE      CONC.  (mIU/mL)   <=1 WEEK        5 - 50     2 WEEKS       50 - 500     3 WEEKS       100 - 10,000     4 WEEKS     1,000 - 30,000        FEMALE AND NON-PREGNANT FEMALE:     LESS THAN 5 mIU/mL   CBC     Status: Abnormal   Collection Time: 06/03/17  1:41 AM  Result Value Ref Range   WBC 6.7 4.0 - 10.5 K/uL   RBC 3.57 (L) 3.87 - 5.11 MIL/uL   Hemoglobin 9.8 (L) 12.0 - 15.0 g/dL   HCT 33.3 (L) 36.0 - 46.0 %   MCV 93.3 78.0 - 100.0 fL   MCH 27.5 26.0 - 34.0 pg   MCHC 29.4 (L) 30.0 - 36.0 g/dL   RDW 18.8 (H) 11.5 - 15.5 %   Platelets 240 150 - 400 K/uL    Comment: Performed at [facility], [place] [address] [place], [place] [address]  Basic metabolic panel     Status: Abnormal   Collection Time: 06/03/17  1:41 AM  Result Value Ref Range   Sodium 140 135 - 145 mmol/L   Potassium 5.8 (H) 3.5 - 5.1 mmol/L   Chloride 109 101 - 111 mmol/L   CO2 14 (L) 22 - 32 mmol/L   Glucose, Bld 84 65 - 99 mg/dL   BUN  87 (H) 6 - 20 mg/dL   Creatinine, Ser 16.89 (H) 0.44 - 1.00 mg/dL   Calcium 8.3 (L) 8.9 - 10.3 mg/dL   GFR calc non Af Amer 2 (L) >60 mL/min   GFR calc Af Amer 3 (L) >60 mL/min    Comment: (NOTE) The eGFR has been calculated using the CKD EPI equation. This calculation has not been validated in all clinical situations. eGFR's persistently <60 mL/min signify possible Chronic Kidney Disease.    Anion gap 17 (H) 5 - 15    Comment: Performed at [facility], [place] [address], [place], [place] [address]    ROS: see hpi  Physical Exam: Vitals:   06/03/17 0945 06/03/17 0955  BP: (!) 188/89 (!) 179/86  Pulse: (!) 55 (!) 57  Resp: 19 17  Temp:    SpO2: 100%      General: Obes AAF NAD  WN WD  OX4 , Appropriate  HEENT: [place] , EOMI , MMM Neck: supple, no jvd Heart: RRR , no mrg Lungs: CTA   Abdomen: Obese , SOF , NT, ND Extremities: no pedal edema  Skin: no overt rash  Neuro: Alert OX4, moves all extrm to request ,no overt foacl defficts  apprecaited  Dialysis Access: PD casth  In lower abd Nontender  No dc at exist site, R IJ Perm cath , LUA AVF  Pos bruit .  Dialysis Orders:: CCPD 5 exchanges  Fill 2000 ml  Dwell 1hr 68m  Last fill vol 2.000 1 Day exchange daytime fill vol 2000 ml day dwell time 4 hours  . EDW  113kg   Mircera 05/20/17  2293m Hectorol 66m33m ( pth 554 05/24/17 )   Assessment/Plan 1. ESRD -  Plan for HD today as  K5.8  And slightly uremic values  Scr 17  / C02 14  and Hold CCPD exchanges today  2. ABd pain / Hem pos stool - hgb  9.8  / no hep hd / Wu per admit  3. Hemoccult positive stool = admit team rx  4. Hypertension/volume  -HN  On Lisinopril 98m40m and Metop  25mg56m ,UF on HD 3liter uf  5. Anemia   Of ESRD / ?GI Bld - Give Aranesp  200 today  On hd  6. Metabolic bone disease -  Iv hec on hd  renvela  "5 ac"  7. DM type 2   DavidErnest HaberC [phone]/2019, 10:24 AM   Pt seen, examined and agree w A/P as above.  Rob SKelly SplinterarolNewell Rubbermaidr [phone], 1:32 PM

## 2017-06-03 NOTE — ED Triage Notes (Signed)
TC to report Pt is cleared for dialysis. Dialysis staff reported Pt is in the next group for dialysis.

## 2017-06-03 NOTE — ED Notes (Signed)
Will take PT's  Belongings to [facility]

## 2017-06-03 NOTE — ED Triage Notes (Signed)
Happy meal given to PT ,because  Tray not arrived before Pt went to dialysis

## 2017-06-03 NOTE — ED Triage Notes (Signed)
MD at bedside and reports Pt may go to Dialysis.

## 2017-06-03 NOTE — Progress Notes (Signed)
PD fluid specimen collection completed per Dialysis RN.  Samples walked to lab.  Patient drained with 1000 mL out (initial fill per dialysis was 1L),  No complications or pain with PD drain.  Will continue to monitor.

## 2017-06-04 ENCOUNTER — Inpatient Hospital Stay (HOSPITAL_COMMUNITY): Payer: Medicare Other | Admitting: Anesthesiology

## 2017-06-04 ENCOUNTER — Encounter (HOSPITAL_COMMUNITY): Payer: Self-pay

## 2017-06-04 ENCOUNTER — Encounter (HOSPITAL_COMMUNITY)
Admission: EM | Disposition: A | Discharge status: Home / Self Care | Payer: Self-pay | Source: Home / Self Care | Attending: Internal Medicine

## 2017-06-04 HISTORY — PX: ESOPHAGOGASTRODUODENOSCOPY (EGD) WITH PROPOFOL: SHX5813

## 2017-06-04 LAB — BASIC METABOLIC PANEL
ANION GAP: 15 (ref 5–15)
BUN: 34 mg/dL — ABNORMAL HIGH (ref 6–20)
CALCIUM: 7.9 mg/dL — AB (ref 8.9–10.3)
CO2: 23 mmol/L (ref 22–32)
Chloride: 98 mmol/L — ABNORMAL LOW (ref 101–111)
Creatinine, Ser: 10.45 mg/dL — ABNORMAL HIGH (ref 0.44–1.00)
GFR calc Af Amer: 5 mL/min — ABNORMAL LOW (ref >60)
GFR, EST NON AFRICAN AMERICAN: 4 mL/min — AB (ref >60)
GLUCOSE: 91 mg/dL (ref 65–99)
Potassium: 4.1 mmol/L (ref 3.5–5.1)
Sodium: 136 mmol/L (ref 135–145)

## 2017-06-04 LAB — CBC
HEMATOCRIT: 33.5 % — AB (ref 36.0–46.0)
Hemoglobin: 10.2 g/dL — ABNORMAL LOW (ref 12.0–15.0)
MCH: 27.7 pg (ref 26.0–34.0)
MCHC: 30.4 g/dL (ref 30.0–36.0)
MCV: 91 fL (ref 78.0–100.0)
PLATELETS: 196 10*3/uL (ref 150–400)
RBC: 3.68 MIL/uL — ABNORMAL LOW (ref 3.87–5.11)
RDW: 18.6 % — AB (ref 11.5–15.5)
WBC: 7 10*3/uL (ref 4.0–10.5)

## 2017-06-04 LAB — PATHOLOGIST SMEAR REVIEW

## 2017-06-04 SURGERY — ESOPHAGOGASTRODUODENOSCOPY (EGD) WITH PROPOFOL
Anesthesia: Monitor Anesthesia Care | Laterality: Left

## 2017-06-04 MED ORDER — PROPOFOL 500 MG/50ML IV EMUL
INTRAVENOUS | Status: DC | PRN
  Administered 2017-06-04: 125 ug/kg/min via INTRAVENOUS

## 2017-06-04 MED ORDER — PANTOPRAZOLE SODIUM 40 MG PO TBEC: 40.0000 mg | DELAYED_RELEASE_TABLET | Freq: Two times a day (BID) | ORAL | 1 refills | Status: DC

## 2017-06-04 MED ORDER — BUTAMBEN-TETRACAINE-BENZOCAINE 2-2-14 % EX AERO
INHALATION_SPRAY | CUTANEOUS | Status: DC | PRN
  Administered 2017-06-04: 2 via TOPICAL

## 2017-06-04 MED ORDER — PROPOFOL 10 MG/ML IV BOLUS
INTRAVENOUS | Status: DC | PRN
  Administered 2017-06-04: 20 mg via INTRAVENOUS
  Administered 2017-06-04 (×2): 15 mg via INTRAVENOUS
  Administered 2017-06-04: 20 mg via INTRAVENOUS

## 2017-06-04 MED ORDER — CALCITRIOL 0.5 MCG PO CAPS: 0.5000 ug | ORAL_CAPSULE | Freq: Every day | ORAL | 0 refills | Status: DC

## 2017-06-04 MED ORDER — CALCITRIOL 0.25 MCG PO CAPS
0.5000 ug | ORAL_CAPSULE | Freq: Every day | ORAL | Status: DC
  Administered 2017-06-04: 0.5 ug via ORAL
  Filled 2017-06-04: qty 2

## 2017-06-04 MED ORDER — PANTOPRAZOLE SODIUM 40 MG PO TBEC: 40.0000 mg | DELAYED_RELEASE_TABLET | Freq: Two times a day (BID) | ORAL | Status: DC

## 2017-06-04 SURGICAL SUPPLY — 14 items

## 2017-06-04 NOTE — Interval H&P Note (Signed)
History and Physical Interval Note:  06/04/2017 9:24 AM  Patricia [name]  has presented today for surgery, with the diagnosis of melena, anemia, abdominal pain  The various methods of treatment have been discussed with the patient and family. After consideration of risks, benefits and other options for treatment, the patient has consented to  Procedure(s): ESOPHAGOGASTRODUODENOSCOPY (EGD) WITH PROPOFOL (Left) as a surgical intervention .  The patient's history has been reviewed, patient examined, no change in status, stable for surgery.  I have reviewed the patient's chart and labs.  Questions were answered to the patient's satisfaction.     Landry Dyke

## 2017-06-04 NOTE — Anesthesia Preprocedure Evaluation (Addendum)
Anesthesia Evaluation  Patient identified by MRN, date of birth, ID band Patient awake    Reviewed: Allergy & Precautions, NPO status , Patient's Chart, lab work & pertinent test results, reviewed documented beta blocker date and time   Airway Mallampati: II  TM Distance: >3 FB Neck ROM: Full    Dental  (+) Teeth Intact   Pulmonary    breath sounds clear to auscultation       Cardiovascular hypertension, Pt. on medications and Pt. on home beta blockers  Rhythm:Regular Rate:Normal  '17 TTE - Mild LVH. EF 55% to 60%. Mildly dilated LA. Trivial TR.    Neuro/Psych  Headaches, Anxiety    GI/Hepatic Neg liver ROS, GERD  Controlled and Medicated,  Endo/Other  diabetes, Well Controlled, Type 2Morbid obesity  Renal/GU Dialysis and ESRFRenal disease     Musculoskeletal negative musculoskeletal ROS (+)   Abdominal (+) + obese,   Peds  Hematology  (+) anemia ,   Anesthesia Other Findings   Reproductive/Obstetrics                           Anesthesia Physical  Anesthesia Plan  ASA: III  Anesthesia Plan: MAC   Post-op Pain Management:    Induction: Intravenous  PONV Risk Score and Plan: 2 and Treatment may vary due to age or medical condition and Propofol infusion  Airway Management Planned: Nasal Cannula and Natural Airway  Additional Equipment: None  Intra-op Plan:   Post-operative Plan:   Informed Consent: I have reviewed the patients History and Physical, chart, labs and discussed the procedure including the risks, benefits and alternatives for the proposed anesthesia with the patient or authorized representative who has indicated his/her understanding and acceptance.     Plan Discussed with: CRNA and Anesthesiologist  Anesthesia Plan Comments:         Anesthesia Quick Evaluation

## 2017-06-04 NOTE — Transfer of Care (Signed)
Immediate Anesthesia Transfer of Care Note  Patient: Patricia [name]  Procedure(s) Performed: ESOPHAGOGASTRODUODENOSCOPY (EGD) WITH PROPOFOL (Left )  Patient Location: Endoscopy Unit  Anesthesia Type:MAC  Level of Consciousness: awake, alert  and oriented  Airway & Oxygen Therapy: Patient Spontanous Breathing  Post-op Assessment: Report given to RN and Post -op Vital signs reviewed and stable  Post vital signs: Reviewed and stable  Last Vitals:  Vitals Value Taken Time  BP 137/81 06/04/2017  9:52 AM  Temp 37.1 C 06/04/2017  9:52 AM  Pulse 89 06/04/2017  9:54 AM  Resp 14 06/04/2017  9:54 AM  SpO2 99 % 06/04/2017  9:54 AM  Vitals shown include unvalidated device data.  Last Pain:  Vitals:   06/04/17 0952  TempSrc: Oral  PainSc: 0-No pain         Complications: No apparent anesthesia complications

## 2017-06-04 NOTE — Discharge Summary (Addendum)
[facility]  Physician Discharge Summary   Patient ID: Patricia [name] MRN: [id number] DOB/AGE: 1975-08-06 42 y.o.  Admit date: 06/02/2017 Discharge date: 06/04/2017  PCP: Blair Heys, PA-C  DISCHARGE DIAGNOSES:  Gastritis  RECOMMENDATIONS FOR OUTPATIENT FOLLOW UP: 1. Patient needs repeat CBC in 1 week 2. Consider outpatient colonoscopy if anemia persists 3. H. pylori serology is pending   DISCHARGE CONDITION: fair  Diet recommendation: As before but to begin with bland diet for 3 days  Filed Weights   06/03/17 1400 06/03/17 1930 06/03/17 2057  Weight: 117.5 kg (259 lb 0.7 oz) 113.7 kg (250 lb 10.6 oz) 113.7 kg (250 lb 10.6 oz)    INITIAL HISTORY: 42 year old African-American female with a past medical history of ESRD recently started on peritoneal dialysis this past week, history of hypertension and diabetes presented with a 3-day history of black stools.  Concern was for GI bleed.  She was hospitalized for further management.  Consultations: [facility]  Procedures:  EGD Impression:      - Medium-sized hiatal hernia. - Gastritis. - Duodenitis. - Suspect gastric erosions, gastritis, duodenitis, could lead to abdominal pain and melena and anemia.   HOSPITAL COURSE:   Gastritis Concern was for GI bleeding.  Patient's hemoglobin however remained stable.  She was placed on PPI.  She was seen by gastroenterology.  She underwent EGD which showed gastritis and duodenitis.  No active bleeding was noted.  She will be placed on twice daily PPI.  H. pylori serology has been sent and is pending.  Discussed with gastroenterology.  Cleared for discharge today.   Abdominal pain Etiology is unclear. Possibly related to above.  No significant abnormalities noted on CT scan.  Patient is on peritoneal dialysis and so peritonitis is always a possibility.    Counts were unremarkable.  Symptoms have improved.  Continue PPI.  End-stage renal disease on peritoneal  dialysis Seen by nephrology.  She underwent hemodialysis yesterday due to uremia.  Improved this morning.  She will continue with peritoneal dialysis at home.  Essential hypertension Blood pressure was suboptimally controlled.  Some of the elevation in BP could be secondary to pain.  Improved today.  Normocytic anemia, likely anemia of chronic disease Hemoglobin is stable.    Overall stable.  Okay for discharge today.   PERTINENT LABS:  The results of significant diagnostics from this hospitalization (including imaging, microbiology, ancillary and laboratory) are listed below for reference.    Microbiology: Recent Results (from the past 240 hour(s))  Blood culture (routine x 2)     Status: None (Preliminary result)   Collection Time: 06/03/17 12:55 AM  Result Value Ref Range Status   Specimen Description BLOOD RIGHT FOREARM  Final   Special Requests   Final    BOTTLES DRAWN AEROBIC AND ANAEROBIC Blood Culture adequate volume   Culture   Final    NO GROWTH 1 DAY Performed at [facility], [address], [place], [place] [address]    Report Status PENDING  Incomplete  Blood culture (routine x 2)     Status: None (Preliminary result)   Collection Time: 06/03/17  1:09 AM  Result Value Ref Range Status   Specimen Description BLOOD RIGHT WRIST  Final   Special Requests   Final    BOTTLES DRAWN AEROBIC AND ANAEROBIC Blood Culture results may not be optimal due to an inadequate volume of blood received in culture bottles   Culture   Final    NO GROWTH 1 DAY Performed  at [facility], [address] [place], [place] [address]    Report Status PENDING  Incomplete  Body fluid culture     Status: None (Preliminary result)   Collection Time: 06/03/17  2:40 AM  Result Value Ref Range Status   Specimen Description PERITONEAL DIALYSATE  Final   Special Requests NONE  Final   Gram Stain   Final    WBC PRESENT,BOTH PMN AND MONONUCLEAR NO ORGANISMS SEEN CYTOSPIN SMEAR      Culture   Final    NO GROWTH 1 DAY Performed at [facility], [address] [place], [place] [address]    Report Status PENDING  Incomplete     Labs: Basic Metabolic Panel: Recent Labs  Lab 06/02/17 2011 06/03/17 0141 06/04/17 0517  NA 142 140 136  K 5.0 5.8* 4.1  CL 105 109 98*  CO2 20* 14* 23  GLUCOSE 86 84 91  BUN 85* 87* 34*  CREATININE 17.08* 16.89* 10.45*  CALCIUM 8.5* 8.3* 7.9*   Liver Function Tests: Recent Labs  Lab 06/02/17 2011  AST 26  ALT 12*  ALKPHOS 51  BILITOT 0.4  PROT 6.2*  ALBUMIN 3.0*   CBC: Recent Labs  Lab 06/02/17 2011 06/03/17 0141 06/03/17 2114 06/04/17 0517  WBC 5.4 6.7 11.1* 7.0  HGB 9.9* 9.8* 11.3* 10.2*  HCT 33.2* 33.3* 37.2 33.5*  MCV 93.3 93.3 90.5 91.0  PLT 261 240 210 196    IMAGING STUDIES Ct Renal Stone Study  Result Date: 06/03/2017 CLINICAL DATA:  Acute onset of lower abdominal pain and melena. EXAM: CT ABDOMEN AND PELVIS WITHOUT CONTRAST TECHNIQUE: Multidetector CT imaging of the abdomen and pelvis was performed following the standard protocol without IV contrast. COMPARISON:  CT of the abdomen and pelvis from 10/01/2015, and renal ultrasound performed 04/03/2016 FINDINGS: Lower chest: The visualized lung bases are grossly clear. The visualized portions of the mediastinum are unremarkable. Hepatobiliary: Trace ascites about the liver and minimal associated free air reflects the patient's peritoneal dialysis. The liver is unremarkable in appearance. The gallbladder is within normal limits. The common bile duct is normal in caliber. Pancreas: The pancreas is within normal limits. Spleen: The spleen is unremarkable in appearance. Trace ascites is noted about the spleen. Adrenals/Urinary Tract: The adrenal glands are unremarkable in appearance. The kidneys are within normal limits. There is no evidence of hydronephrosis. No renal or ureteral stones are identified. No perinephric stranding is seen. Stomach/Bowel: The  stomach is unremarkable in appearance. The small bowel is within normal limits. The appendix is normal in caliber, without evidence of appendicitis. Scattered diverticulosis is noted along the descending and proximal sigmoid colon, without evidence of diverticulitis. Vascular/Lymphatic: The abdominal aorta is unremarkable in appearance. The inferior vena cava is grossly unremarkable. No retroperitoneal lymphadenopathy is seen. No pelvic sidewall lymphadenopathy is identified. Reproductive: The bladder is mildly distended and grossly unremarkable. The patient is status post hysterectomy. No suspicious adnexal masses are seen. The ovaries appear grossly symmetric. Other: Trace free fluid in the pelvis reflects peritoneal dialysis. The peritoneal dialysis catheter is grossly unremarkable, ending at the right hemipelvis. A small anterior abdominal wall hernia is noted superior to the umbilicus, containing only fat. Musculoskeletal: No acute osseous abnormalities are identified. The visualized musculature is unremarkable in appearance. IMPRESSION: 1. No acute abnormality seen to explain the patient's symptoms. 2. Trace ascites within the abdomen and pelvis and minimal associated free air reflects the patient's peritoneal dialysis. 3. Scattered diverticulosis along the descending  and proximal sigmoid colon, without evidence of diverticulitis. 4. Small anterior abdominal wall hernia superior to the umbilicus, containing only fat. Electronically Signed   By: Garald Balding M.D.   On: 06/03/2017 00:14    DISCHARGE EXAMINATION: Vitals:   06/04/17 0549 06/04/17 0835 06/04/17 0952 06/04/17 1002  BP: 123/78 (!) 175/100 137/81 137/67  Pulse: 79 71 91   Resp: $Remo'18 14 17 15  'WWsHR$ Temp: 98.7 F (37.1 C) 99 F (37.2 C) 98.7 F (37.1 C)   TempSrc:  Oral Oral   SpO2: 94% 98% 99% 100%  Weight:      Height:       General appearance: alert, cooperative, appears stated age and no distress Resp: clear to auscultation  bilaterally Cardio: regular rate and rhythm, S1, S2 normal, no murmur, click, rub or gallop GI: soft, non-tender; bowel sounds normal; no masses,  no organomegaly  DISPOSITION: Home  Discharge Instructions    Call MD for:  extreme fatigue   Complete by:  As directed    Call MD for:  persistant dizziness or light-headedness   Complete by:  As directed    Call MD for:  persistant nausea and vomiting   Complete by:  As directed    Call MD for:  severe uncontrolled pain   Complete by:  As directed    Call MD for:  temperature >100.4   Complete by:  As directed    Discharge instructions   Complete by:  As directed    You will neeed to have your hemoglobin checked in 1 week. If still low, please talk to your PCP about getting a colonoscopy. Please seek attention if symptoms recur. H pylori blood test will be drawn today and results will be pending at discharge. Bland diet for 3 days.  You were cared for by a hospitalist during your hospital stay. If you have any questions about your discharge medications or the care you received while you were in the hospital after you are discharged, you can call the unit and asked to speak with the hospitalist on call if the hospitalist that took care of you is not available. Once you are discharged, your primary care physician will handle any further medical issues. Please note that NO REFILLS for any discharge medications will be authorized once you are discharged, as it is imperative that you return to your primary care physician (or establish a relationship with a primary care physician if you do not have one) for your aftercare needs so that they can reassess your need for medications and monitor your lab values. If you do not have a primary care physician, you can call [phone] for a physician referral.   Increase activity slowly   Complete by:  As directed         Allergies as of 06/04/2017      Reactions   Nsaids Other (See Comments)   Cannot take  due to Kidney disease Kidney disease Kidney function   Tolmetin Other (See Comments)   Cannot take due to Kidney Disease      Medication List    STOP taking these medications   omeprazole 40 MG capsule Commonly known as:  PRILOSEC     TAKE these medications   allopurinol 100 MG tablet Commonly known as:  ZYLOPRIM Take 100 mg by mouth daily.   ALPRAZolam 1 MG tablet Commonly known as:  XANAX Take 1 mg by mouth 2 (two) times daily.   aspirin 81 MG chewable tablet Chew  1 tablet (81 mg total) by mouth daily.   atorvastatin 40 MG tablet Commonly known as:  LIPITOR Take 40 mg by mouth daily.   calcitRIOL 0.5 MCG capsule Commonly known as:  ROCALTROL Take 1 capsule (0.5 mcg total) by mouth daily with breakfast.   lisinopril 10 MG tablet Commonly known as:  PRINIVIL,ZESTRIL Take 10 mg by mouth daily.   metoprolol tartrate 25 MG tablet Commonly known as:  LOPRESSOR Take 25 mg by mouth daily.   multivitamin Tabs tablet Take 1 tablet by mouth daily.   pantoprazole 40 MG tablet Commonly known as:  PROTONIX Take 1 tablet (40 mg total) by mouth 2 (two) times daily before a meal.   senna 8.6 MG Tabs tablet Commonly known as:  SENOKOT Take 2 tablets (17.2 mg total) by mouth at bedtime.   sevelamer carbonate 800 MG tablet Commonly known as:  RENVELA Take 2,400-4,000 mg by mouth See admin instructions. Take 5 tablets with each meal and take 3 tablets with each snack   venlafaxine XR 75 MG 24 hr capsule Commonly known as:  EFFEXOR-XR Take 75 mg by mouth daily with breakfast.          TOTAL DISCHARGE TIME: 35 mins  [facility] Pager [phone]  06/04/2017, 4:27 PM

## 2017-06-04 NOTE — Op Note (Signed)
[facility] [name] Procedure Date : 06/04/2017 MRN: [id number] Attending MD: Arta Silence , MD Date of Birth: May 05, 1975 CSN: [id number] Age: 42 Admit Type: Inpatient Procedure:                Upper GI endoscopy Indications:              Upper abdominal pain, Acute post hemorrhagic                            anemia, Melena Providers:                Arta Silence, MD, Burtis Junes, RN, Charolette Child,                            Technician Referring MD:              Medicines:                Monitored Anesthesia Care Complications:            No immediate complications. Estimated Blood Loss:     Estimated blood loss: none. Procedure:                Pre-Anesthesia Assessment:                           - Prior to the procedure, a History and Physical                            was performed, and patient medications and                            allergies were reviewed. The patient's tolerance of                            previous anesthesia was also reviewed. The risks                            and benefits of the procedure and the sedation                            options and risks were discussed with the patient.                            All questions were answered, and informed consent                            was obtained. Prior Anticoagulants: The patient has                            taken aspirin. ASA Grade Assessment: III - A                            patient with severe systemic disease. After                            reviewing the  risks and benefits, the patient was                            deemed in satisfactory condition to undergo the                            procedure.                           After obtaining informed consent, the endoscope was                            passed under direct vision. Throughout the                            procedure, the patient's blood pressure, pulse, and   oxygen saturations were monitored continuously. The                            EG-[id number] ([id number]) scope was introduced through the                            mouth, and advanced to the second part of duodenum.                            The upper GI endoscopy was accomplished without                            difficulty. The patient tolerated the procedure                            well. Scope In: Scope Out: Findings:      A medium-sized hiatal hernia was present.      The exam of the esophagus was otherwise normal.      Patchy moderate inflammation characterized by congestion (edema),       erosions and shallow ulcerations was found in the prepyloric region of       the stomach.      The exam of the stomach was otherwise normal.      Localized mild inflammation characterized by congestion (edema) and       erythema was found in the duodenal bulb.      The exam of the duodenum was otherwise normal. No old or fresh blood       seen to the extent of our examination. Impression:               - Medium-sized hiatal hernia.                           - Gastritis.                           - Duodenitis.                           - Suspect gastric erosions, gastritis, duodenitis,  could lead to abdominal pain and melena and anemia. Moderate Sedation:      None Recommendation:           - Return patient to hospital ward for ongoing care.                           - Soft diet today.                           - Continue present medications, including PPI.                           - Perform an H. pylori serology today.                           - Needs to stay on PPI indefinitely so long as she                            stays on aspirin.                           - Patient should follow-up with GI docs in                            [place] (with whom she's established as an                            outpatient); will need follow-up CBC in a few                             weeks, and if still low, might need the                            [place] to consider outpatient                            colonoscopy; [facility] will sign off; thank you for                            the consultation. Procedure Code(s):        --- Professional ---                           [phone], Esophagogastroduodenoscopy, flexible,                            transoral; diagnostic, including collection of                            specimen(s) by brushing or washing, when performed                            (separate procedure) Diagnosis Code(s):        --- Professional ---  K44.9, Diaphragmatic hernia without obstruction or                            gangrene                           K29.70, Gastritis, unspecified, without bleeding                           K29.80, Duodenitis without bleeding                           R10.10, Upper abdominal pain, unspecified                           D62, Acute posthemorrhagic anemia                           K92.1, Melena (includes Hematochezia) CPT copyright 2017 [employer]. All rights reserved. The codes documented in this report are preliminary and upon coder review may  be revised to meet current compliance requirements. Arta Silence, MD 06/04/2017 10:02:39 AM This report has been signed electronically. Number of Addenda: 0

## 2017-06-04 NOTE — Anesthesia Procedure Notes (Signed)
Procedure Name: MAC Date/Time: 06/04/2017 9:34 AM Performed by: White, Amedeo Plenty, CRNA Pre-anesthesia Checklist: Patient being monitored, Suction available, Emergency Drugs available and Patient identified Patient Re-evaluated:Patient Re-evaluated prior to induction Oxygen Delivery Method: Nasal cannula

## 2017-06-04 NOTE — Anesthesia Postprocedure Evaluation (Signed)
Anesthesia Post Note  Patient: Patricia [name]  Procedure(s) Performed: ESOPHAGOGASTRODUODENOSCOPY (EGD) WITH PROPOFOL (Left )     Patient location during evaluation: PACU Anesthesia Type: MAC Level of consciousness: awake and alert Pain management: pain level controlled Vital Signs Assessment: post-procedure vital signs reviewed and stable Respiratory status: spontaneous breathing, nonlabored ventilation and respiratory function stable Cardiovascular status: stable and blood pressure returned to baseline Anesthetic complications: no    Last Vitals:  Vitals:   06/04/17 0952 06/04/17 1002  BP: 137/81 137/67  Pulse: 91   Resp: 17 15  Temp: 37.1 C   SpO2: 99% 100%    Last Pain:  Vitals:   06/04/17 1002  TempSrc:   PainSc: 0-No pain                 Audry Pili

## 2017-06-04 NOTE — Progress Notes (Addendum)
[facility] Progress Note   Dialysis Orders: CCPD 5 exchanges  Fill 2000 ml  Dwell 1hr 58mn  Last fill vol 2.000 1 Day exchange daytime fill vol 2000 ml day dwell time 4 hours  . EDW  113kg   Mircera 05/20/17  262mcg Calcitriol 0.5  ( pth 554 05/24/17 )    Assessment/Plan: 1. Abd pain - gastritis and duodenitis on EGD - to be on PPI indefinitely  2. ESRD - has just finished PD training and was to start on her own this evening - had HD for ease of fast improvement in K/volume last evening K 5.8 down to 4.1 today Cr 16.9 > 10.5 - not clear if she just wasn't getting adequate HD during the training process or not having adequate filtration with PD. If not discharged today, she would prefer CCPD tonight; have not written orders yet 3. Anemia - hgb 10.2 stable FOBT+hgb actually is higher than last outpt hgb at 9.1 -Aranesp 200 given 5/30  4. Secondary hyperparathyroidism - she was changed to calcitriol 0.5 daily now that she is on PD  -will change 5. HTN/volume - net UF 3.5 5/30 post wt 113.7 - no change in meds 6. Nutrition - renal diet/vit 7. Disp - she looks good from a renal perspective for d/c today and she could resume her home PD tonight.  She feels capable for now this.  Given stable hgb, I think she is ok- she can have follow up labs at her outpt HD unit next week  Myriam Jacobson, PA-C [place] [phone] 06/04/2017,12:33 PM  LOS: 1 day   Pt seen, examined and agree w A/P as above.  Kelly Splinter MD [facility] pager [phone]   06/04/2017, 2:42 PM    Subjective:   Really lights PD - had BP drops on HD last night   Objective Vitals:   06/04/17 0549 06/04/17 0835 06/04/17 0952 06/04/17 1002  BP: 123/78 (!) 175/100 137/81 137/67  Pulse: 79 71 91   Resp: $Remo'18 14 17 15  'FCqMr$ Temp: 98.7 F (37.1 C) 99 F (37.2 C) 98.7 F (37.1 C)   TempSrc:  Oral Oral   SpO2: 94% 98% 99% 100%  Weight:      Height:       Physical  Exam General: alert and articulate Heart: RRR Lungs: no rales Abdomen: soft NT - PD cath Extremities: no LE edema Dialysis Access:  Right IJ TDC and left upper AVF + bruit   Additional Objective Labs: Basic Metabolic Panel: Recent Labs  Lab 06/02/17 2011 06/03/17 0141 06/04/17 0517  NA 142 140 136  K 5.0 5.8* 4.1  CL 105 109 98*  CO2 20* 14* 23  GLUCOSE 86 84 91  BUN 85* 87* 34*  CREATININE 17.08* 16.89* 10.45*  CALCIUM 8.5* 8.3* 7.9*   Liver Function Tests: Recent Labs  Lab 06/02/17 2011  AST 26  ALT 12*  ALKPHOS 51  BILITOT 0.4  PROT 6.2*  ALBUMIN 3.0*   No results for input(s): LIPASE, AMYLASE in the last 168 hours. CBC: Recent Labs  Lab 06/02/17 2011 06/03/17 0141 06/03/17 2114 06/04/17 0517  WBC 5.4 6.7 11.1* 7.0  HGB 9.9* 9.8* 11.3* 10.2*  HCT 33.2* 33.3* 37.2 33.5*  MCV 93.3 93.3 90.5 91.0  PLT 261 240 210 196   Blood Culture    Component Value Date/Time   SDES PERITONEAL DIALYSATE 06/03/2017 [place] 06/03/2017 0240   CULT  06/03/2017 0240  NO GROWTH 1 DAY Performed at [facility], [place] [address], [place], [place] [address]    REPTSTATUS PENDING 06/03/2017 0240    Cardiac Enzymes: No results for input(s): CKTOTAL, CKMB, CKMBINDEX, TROPONINI in the last 168 hours. CBG: No results for input(s): GLUCAP in the last 168 hours. Iron Studies: No results for input(s): IRON, TIBC, TRANSFERRIN, FERRITIN in the last 72 hours. Lab Results  Component Value Date   INR 1.04 04/14/2016   INR 0.98 06/18/2015   INR 1.03 01/18/2015   Studies/Results: Ct Renal Stone Study  Result Date: 06/03/2017 CLINICAL DATA:  Acute onset of lower abdominal pain and melena. EXAM: CT ABDOMEN AND PELVIS WITHOUT CONTRAST TECHNIQUE: Multidetector CT imaging of the abdomen and pelvis was performed following the standard protocol without IV contrast. COMPARISON:  CT of the abdomen and pelvis from 10/01/2015, and renal ultrasound performed 04/03/2016  FINDINGS: Lower chest: The visualized lung bases are grossly clear. The visualized portions of the mediastinum are unremarkable. Hepatobiliary: Trace ascites about the liver and minimal associated free air reflects the patient's peritoneal dialysis. The liver is unremarkable in appearance. The gallbladder is within normal limits. The common bile duct is normal in caliber. Pancreas: The pancreas is within normal limits. Spleen: The spleen is unremarkable in appearance. Trace ascites is noted about the spleen. Adrenals/Urinary Tract: The adrenal glands are unremarkable in appearance. The kidneys are within normal limits. There is no evidence of hydronephrosis. No renal or ureteral stones are identified. No perinephric stranding is seen. Stomach/Bowel: The stomach is unremarkable in appearance. The small bowel is within normal limits. The appendix is normal in caliber, without evidence of appendicitis. Scattered diverticulosis is noted along the descending and proximal sigmoid colon, without evidence of diverticulitis. Vascular/Lymphatic: The abdominal aorta is unremarkable in appearance. The inferior vena cava is grossly unremarkable. No retroperitoneal lymphadenopathy is seen. No pelvic sidewall lymphadenopathy is identified. Reproductive: The bladder is mildly distended and grossly unremarkable. The patient is status post hysterectomy. No suspicious adnexal masses are seen. The ovaries appear grossly symmetric. Other: Trace free fluid in the pelvis reflects peritoneal dialysis. The peritoneal dialysis catheter is grossly unremarkable, ending at the right hemipelvis. A small anterior abdominal wall hernia is noted superior to the umbilicus, containing only fat. Musculoskeletal: No acute osseous abnormalities are identified. The visualized musculature is unremarkable in appearance. IMPRESSION: 1. No acute abnormality seen to explain the patient's symptoms. 2. Trace ascites within the abdomen and pelvis and minimal  associated free air reflects the patient's peritoneal dialysis. 3. Scattered diverticulosis along the descending and proximal sigmoid colon, without evidence of diverticulitis. 4. Small anterior abdominal wall hernia superior to the umbilicus, containing only fat. Electronically Signed   By: Garald Balding M.D.   On: 06/03/2017 00:14   Medications:  . allopurinol  100 mg Oral Daily  . ALPRAZolam  1 mg Oral BID  . atorvastatin  40 mg Oral Daily  . darbepoetin (ARANESP) injection - DIALYSIS  200 mcg Intravenous Q Thu-HD  . doxercalciferol  4 mcg Intravenous Q T,Th,Sa-HD  . lisinopril  10 mg Oral Daily  . metoprolol tartrate  25 mg Oral Daily  . multivitamin  1 tablet Oral Daily  . pantoprazole  40 mg Oral BID AC  . sevelamer carbonate  4,000 mg Oral TID WC  . venlafaxine XR  75 mg Oral Q breakfast

## 2017-06-06 ENCOUNTER — Encounter (HOSPITAL_COMMUNITY): Payer: Self-pay | Admitting: Gastroenterology

## 2017-06-06 LAB — BODY FLUID CULTURE: Culture: NO GROWTH

## 2017-06-08 LAB — CULTURE, BLOOD (ROUTINE X 2)
CULTURE: NO GROWTH
Culture: NO GROWTH
SPECIAL REQUESTS: ADEQUATE

## 2017-06-10 IMAGING — DX DG CHEST 2V
2 series · 2 of 2 positions shown · non-contrast
Comparison: Most recent radiographs 11/29/2015.

CLINICAL DATA: Chest pain and shortness of breath. Nausea and
vomiting for 2 days.

EXAM:
CHEST  2 VIEW

[chest pa]
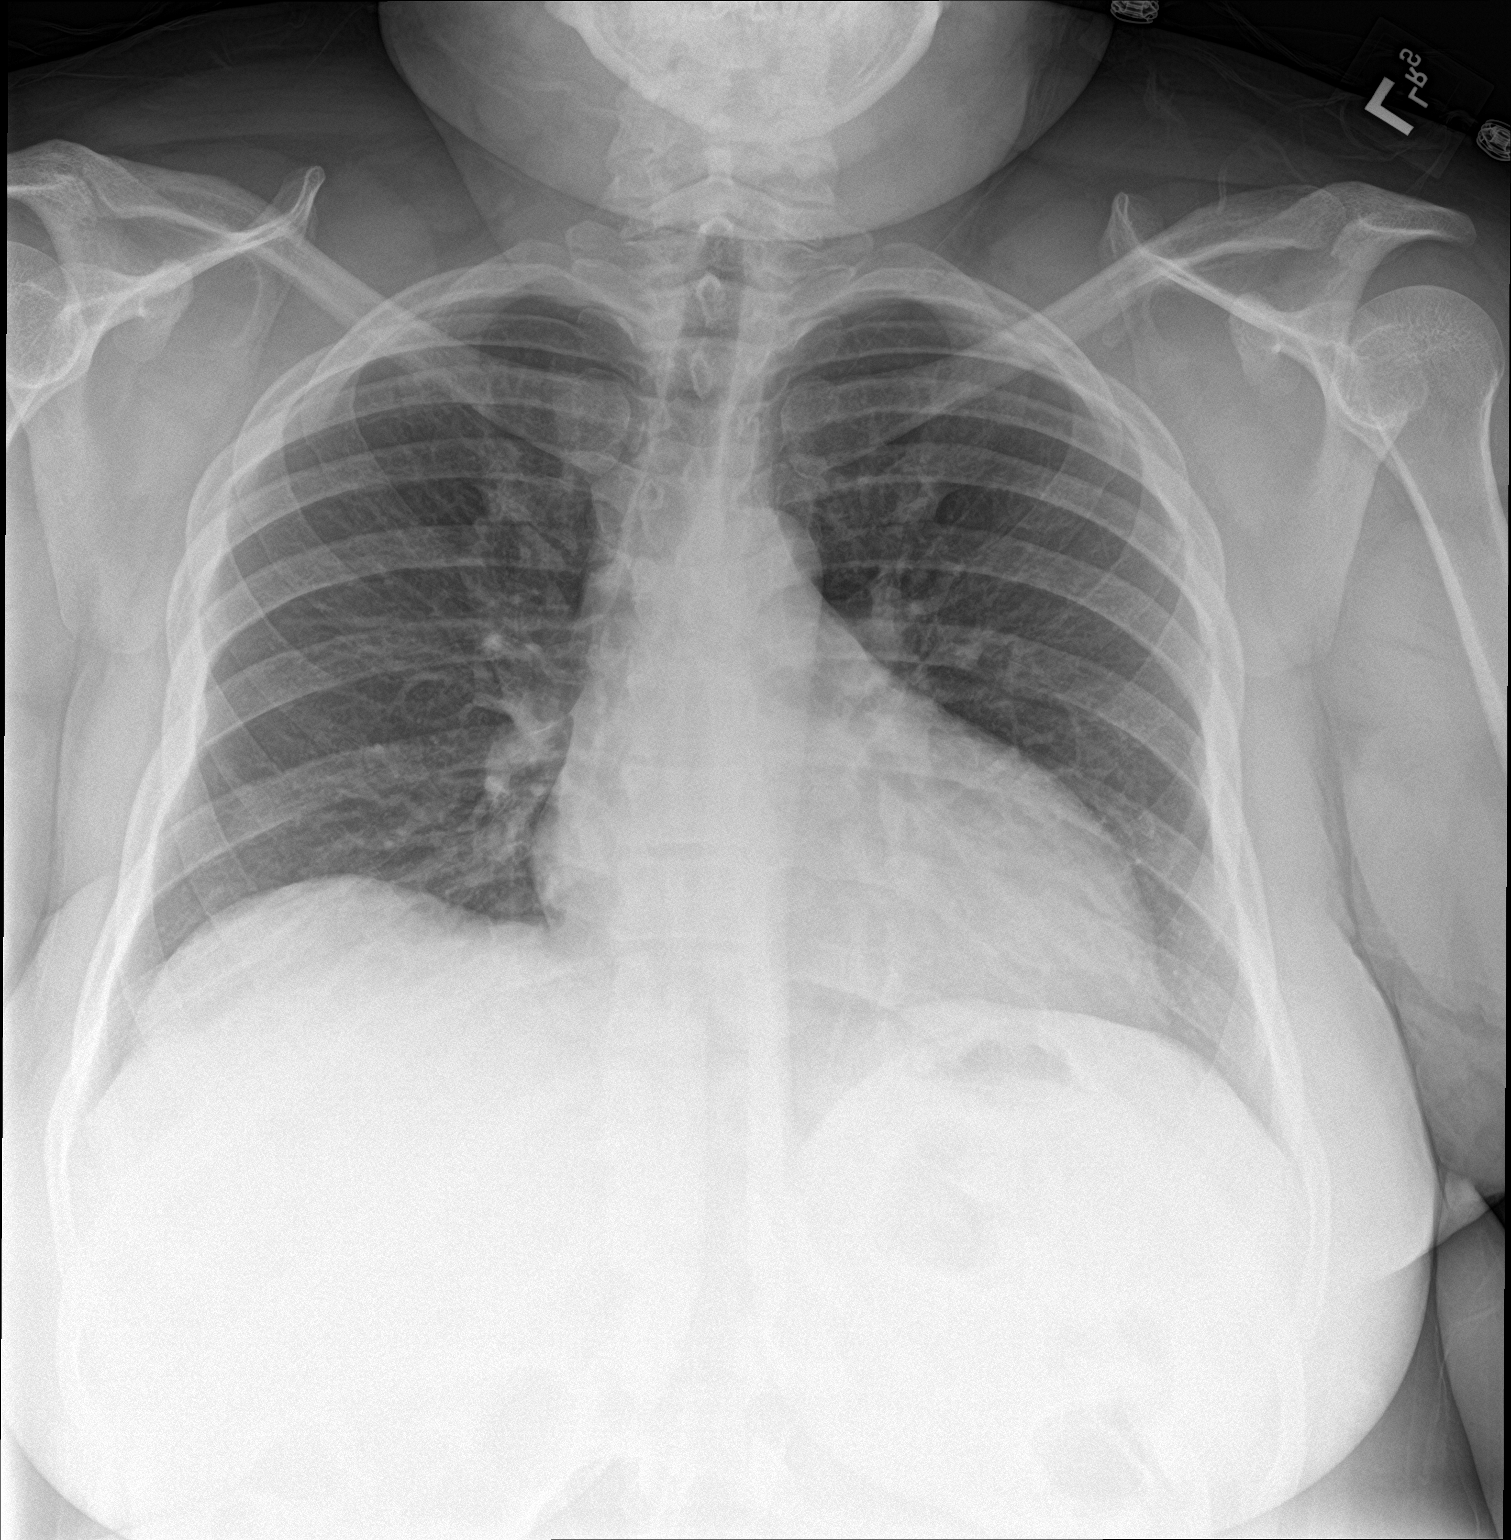

[chest lat]
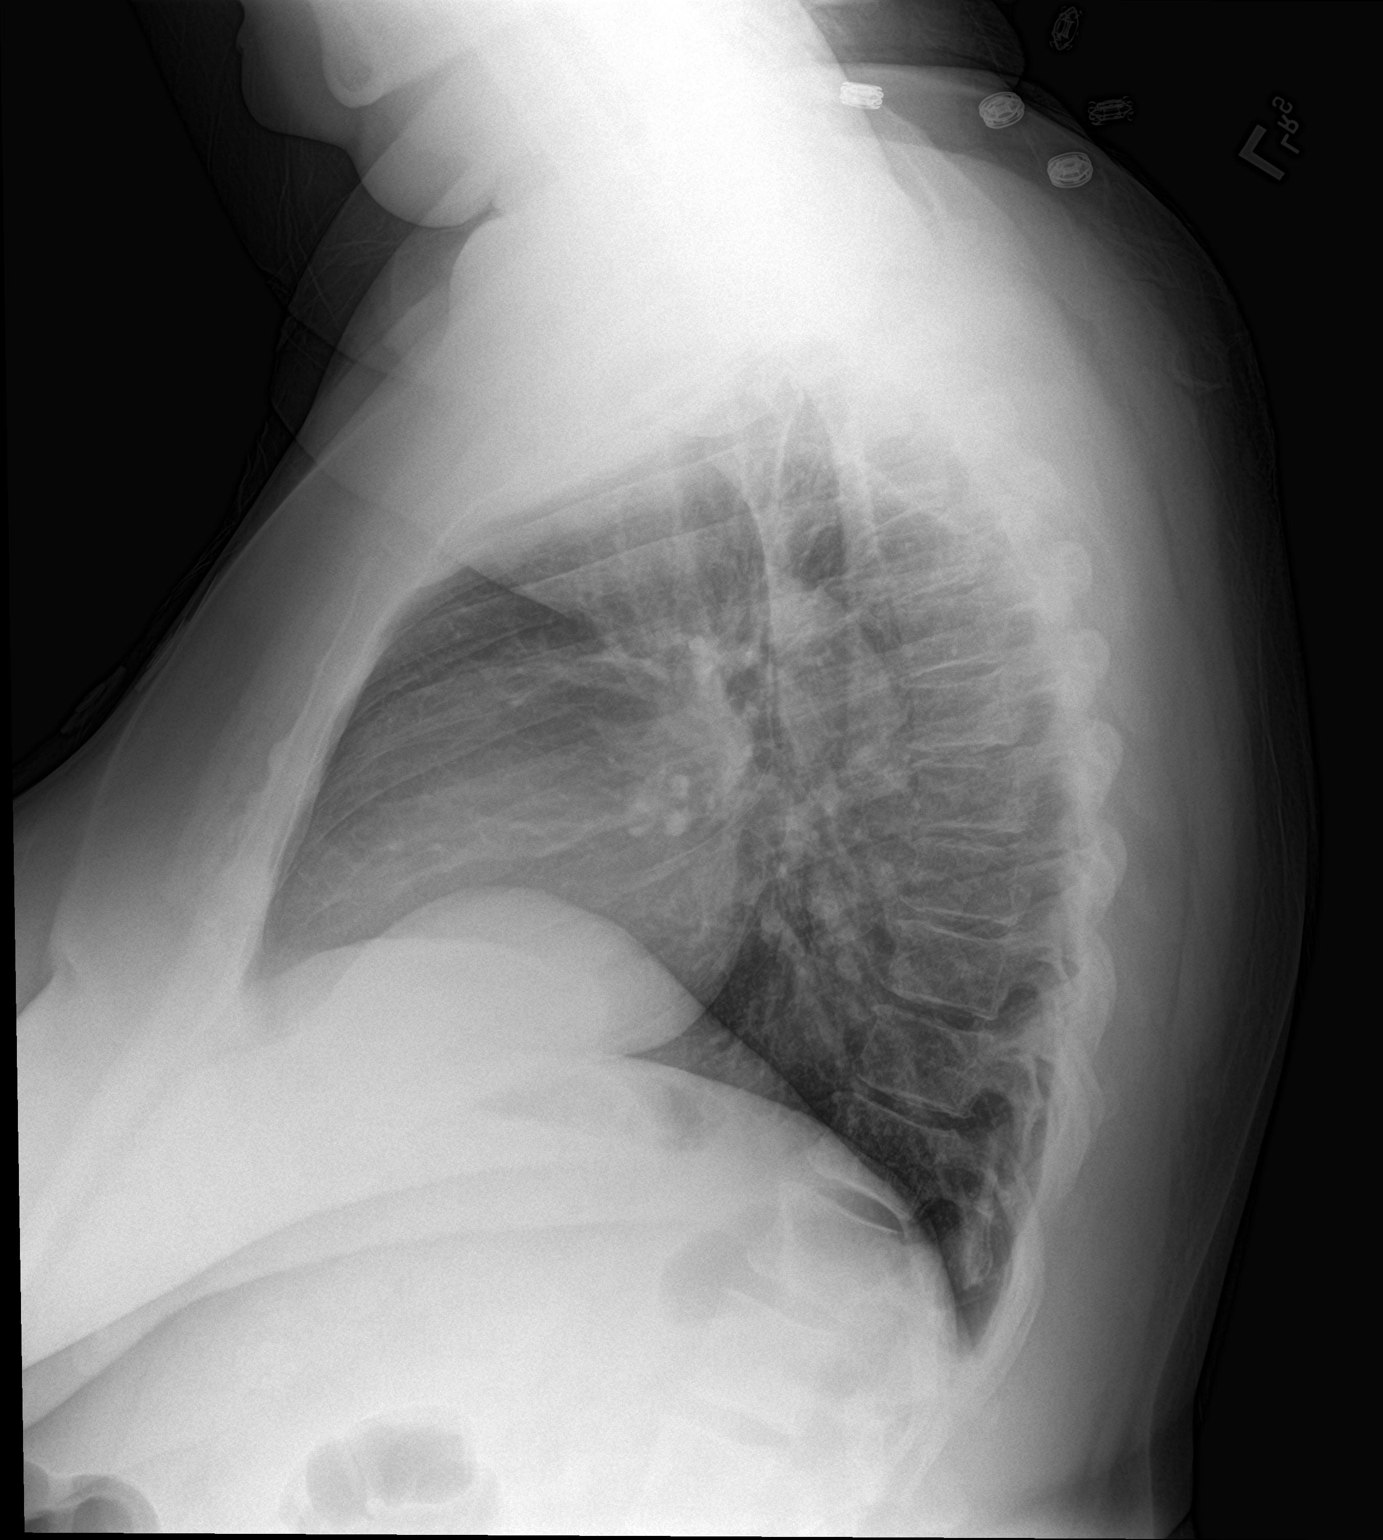

[2 of 2 positions shown; findings below may reference images not displayed]

FINDINGS: The cardiomediastinal contours are unchanged, heart at the upper
limits of normal in size. Low lung volumes again seen. Mild chronic
eventration of right hemidiaphragm. Pulmonary vasculature is normal.
No consolidation, pleural effusion, or pneumothorax. No acute
osseous abnormalities are seen.
IMPRESSION: No acute pulmonary process.

## 2017-07-15 ENCOUNTER — Other Ambulatory Visit: Payer: Self-pay

## 2017-07-15 DIAGNOSIS — T82510A Breakdown (mechanical) of surgically created arteriovenous fistula, initial encounter: Secondary | ICD-10-CM

## 2017-07-29 ENCOUNTER — Ambulatory Visit (HOSPITAL_COMMUNITY)
Admission: RE | Admit: 2017-07-29 | Discharge: 2017-07-29 | Disposition: A | Discharge status: Home / Self Care | Payer: Medicare Other | Source: Ambulatory Visit | Attending: Vascular Surgery | Admitting: Vascular Surgery

## 2017-07-29 DIAGNOSIS — T82510A Breakdown (mechanical) of surgically created arteriovenous fistula, initial encounter: Secondary | ICD-10-CM | POA: Insufficient documentation

## 2017-07-29 DIAGNOSIS — Y832 Surgical operation with anastomosis, bypass or graft as the cause of abnormal reaction of the patient, or of later complication, without mention of misadventure at the time of the procedure: Secondary | ICD-10-CM | POA: Diagnosis not present

## 2017-08-02 ENCOUNTER — Encounter: Payer: Self-pay | Admitting: Vascular Surgery

## 2017-08-02 ENCOUNTER — Ambulatory Visit (INDEPENDENT_AMBULATORY_CARE_PROVIDER_SITE_OTHER): Payer: Medicare Other | Admitting: Vascular Surgery

## 2017-08-02 ENCOUNTER — Encounter: Payer: Self-pay | Admitting: *Deleted

## 2017-08-02 ENCOUNTER — Other Ambulatory Visit: Payer: Self-pay | Admitting: *Deleted

## 2017-08-02 VITALS — BP 108/82 | HR 100 | Temp 97.3°F | Resp 18 | Ht 67.0 in | Wt 247.2 lb

## 2017-08-02 DIAGNOSIS — N186 End stage renal disease: Secondary | ICD-10-CM | POA: Diagnosis not present

## 2017-08-02 DIAGNOSIS — Z992 Dependence on renal dialysis: Secondary | ICD-10-CM | POA: Diagnosis not present

## 2017-08-02 NOTE — Progress Notes (Signed)
Patient is a 42 year old female sent for evaluation of her left arm AV access.  She has previously had a left radiocephalic and brachiocephalic AV fistula.  She has had several elevation procedures of this and angioplasties.  She thinks that the fistula stopped working about 3 weeks ago.  She currently is doing peritoneal dialysis.  She states that she did have some numbness and tingling in her left hand when the access was working.  This is still persistent a little bit in the fingertips but has mostly resolved.  She denies any anticoagulation medications.  Past Medical History:  Diagnosis Date  . Acid reflux   . Anxiety   . ESRD (end stage renal disease) ([place])    TTHSAT- [place]  . Gout   . Headache(784.0)    "related to chemo; sometimes weekly" (09/12/2013)  . High cholesterol   . History of blood transfusion "a couple"   "related to low counts"  . Hypertension   . Iron deficiency anemia    "get epogen shots q month" (02/20/2014)  . 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 Type II diabetes mellitus ([place])    "took me off my RX ~ 04/2013"    Past Surgical History:  Procedure Laterality Date  . ABDOMINAL HYSTERECTOMY  2010   "laparoscopic"  . ANKLE FRACTURE SURGERY Right 1994  . AV FISTULA PLACEMENT Left 04/14/2016   Procedure: ARTERIOVENOUS (AV) FISTULA CREATION;  Surgeon: Angelia Mould, MD;  Location: [facility];  Service: Vascular;  Laterality: Left;  . CARDIAC CATHETERIZATION  2000's  . ESOPHAGOGASTRODUODENOSCOPY    . ESOPHAGOGASTRODUODENOSCOPY (EGD) WITH PROPOFOL Left 06/04/2017   Procedure: ESOPHAGOGASTRODUODENOSCOPY (EGD) WITH PROPOFOL;  Surgeon: Arta Silence, MD;  Location: [facility];  Service: Endoscopy;  Laterality: Left;  . FISTULA SUPERFICIALIZATION Left 04/02/2017   Procedure: FISTULA SUPERFICIALIZATION LEFT ARM;  Surgeon: Serafina Mitchell, MD;  Location: [facility];  Service: Vascular;  Laterality: Left;  .  FISTULOGRAM Left 04/07/2016   Procedure: FISTULOGRAM;  Surgeon: Waynetta Sandy, MD;  Location: [facility];  Service: Vascular;  Laterality: Left;  . FRACTURE SURGERY     right ankle  . INSERTION OF DIALYSIS CATHETER Right 04/07/2016   Procedure: INSERTION OF DIALYSIS CATHETER;  Surgeon: Waynetta Sandy, MD;  Location: [facility];  Service: Vascular;  Laterality: Right;  . INSERTION OF DIALYSIS CATHETER Right 04/04/2017   Procedure: INSERTION OF DIALYSIS CATHETER;  Surgeon: Waynetta Sandy, MD;  Location: [facility];  Service: Vascular;  Laterality: Right;  . LIGATION OF ARTERIOVENOUS  FISTULA Left 04/14/2016   Procedure: LIGATION OF ARTERIOVENOUS  FISTULA;  Surgeon: Angelia Mould, MD;  Location: [facility];  Service: Vascular;  Laterality: Left;  . PATCH ANGIOPLASTY Left 06/19/2016   Procedure: PATCH ANGIOPLASTY LEFT ARTERIOVENOUS FISTULA;  Surgeon: Angelia Mould, MD;  Location: [facility];  Service: Vascular;  Laterality: Left;  . REVISON OF ARTERIOVENOUS FISTULA Left 06/19/2016   Procedure: REVISION SUPERFICIALIZATION OF BRACHIOCEPHALIC ARTERIOVENOUS FISTULA;  Surgeon: Angelia Mould, MD;  Location: [facility];  Service: Vascular;  Laterality: Left;    Current Outpatient Medications on File Prior to Visit  Medication Sig Dispense Refill  . allopurinol (ZYLOPRIM) 100 MG tablet Take 100 mg by mouth daily.    Marland Kitchen ALPRAZolam (XANAX) 1 MG tablet Take 1 mg by mouth 2 (two) times daily.    Marland Kitchen aspirin 81 MG chewable tablet Chew 1 tablet (81 mg total) by mouth  daily.    . atorvastatin (LIPITOR) 40 MG tablet Take 40 mg by mouth daily.  3  . calcitRIOL (ROCALTROL) 0.5 MCG capsule Take 1 capsule (0.5 mcg total) by mouth daily with breakfast. 30 capsule 0  . lisinopril (PRINIVIL,ZESTRIL) 10 MG tablet Take 10 mg by mouth daily.    . multivitamin (RENA-VIT) TABS tablet Take 1 tablet by mouth daily.    . pantoprazole (PROTONIX) 40 MG tablet Take 1 tablet (40 mg total) by mouth 2 (two) times  daily before a meal. 60 tablet 1  . senna (SENOKOT) 8.6 MG TABS tablet Take 2 tablets (17.2 mg total) by mouth at bedtime. 120 each 0  . sevelamer carbonate (RENVELA) 800 MG tablet Take 2,400-4,000 mg by mouth See admin instructions. Take 5 tablets with each meal and take 3 tablets with each snack    . venlafaxine XR (EFFEXOR-XR) 75 MG 24 hr capsule Take 75 mg by mouth daily with breakfast.    . metoprolol tartrate (LOPRESSOR) 25 MG tablet Take 25 mg by mouth daily.      No current facility-administered medications on file prior to visit.     Allergies  Allergen Reactions  . Nsaids Other (See Comments)    Cannot take due to Kidney disease Kidney disease Kidney function   . Tolmetin Other (See Comments)    Cannot take due to Kidney Disease    Physical exam:  Vitals:   08/02/17 1009  BP: 108/82  Pulse: 100  Resp: 18  Temp: (!) 97.3 F (36.3 C)  TempSrc: Oral  SpO2: 98%  Weight: 247 lb 3.2 oz (112.1 kg)  Height: $Remove'5\' 7"'UWfCPrR$  (1.702 m)   Left upper extremity: No palpable radial pulse thrombosed AV fistula left forearm left upper arm no bruit no thrill, obese upper arm  Right upper extremity: 2+ brachial and radial pulse  Assessment: Thrombosed AV fistula left arm multiple prior failures in this AV fistula.  Currently patient is on peritoneal dialysis.  We are requested by Dr Deterding to place a hemodialysis access as a back-up.  Plan: Left upper arm AV graft August 09, 2017.  Risk benefits possible complications and procedure details including but not limited to bleeding infection graft thrombosis ischemic steal were all discussed with patient today.  She understands and agrees to proceed.  Ruta Hinds, MD [facility] Office: [phone] Pager: [phone]

## 2017-08-02 NOTE — H&P (View-Only) (Signed)
Patient is a 42 year old female sent for evaluation of her left arm AV access.  She has previously had a left radiocephalic and brachiocephalic AV fistula.  She has had several elevation procedures of this and angioplasties.  She thinks that the fistula stopped working about 3 weeks ago.  She currently is doing peritoneal dialysis.  She states that she did have some numbness and tingling in her left hand when the access was working.  This is still persistent a little bit in the fingertips but has mostly resolved.  She denies any anticoagulation medications.  Past Medical History:  Diagnosis Date  . Acid reflux   . Anxiety   . ESRD (end stage renal disease) ([place])    TTHSAT- [place]  . Gout   . Headache(784.0)    "related to chemo; sometimes weekly" (09/12/2013)  . High cholesterol   . History of blood transfusion "a couple"   "related to low counts"  . Hypertension   . Iron deficiency anemia    "get epogen shots q month" (02/20/2014)  . 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 Type II diabetes mellitus ([place])    "took me off my RX ~ 04/2013"    Past Surgical History:  Procedure Laterality Date  . ABDOMINAL HYSTERECTOMY  2010   "laparoscopic"  . ANKLE FRACTURE SURGERY Right 1994  . AV FISTULA PLACEMENT Left 04/14/2016   Procedure: ARTERIOVENOUS (AV) FISTULA CREATION;  Surgeon: Angelia Mould, MD;  Location: [facility];  Service: Vascular;  Laterality: Left;  . CARDIAC CATHETERIZATION  2000's  . ESOPHAGOGASTRODUODENOSCOPY    . ESOPHAGOGASTRODUODENOSCOPY (EGD) WITH PROPOFOL Left 06/04/2017   Procedure: ESOPHAGOGASTRODUODENOSCOPY (EGD) WITH PROPOFOL;  Surgeon: Arta Silence, MD;  Location: [facility];  Service: Endoscopy;  Laterality: Left;  . FISTULA SUPERFICIALIZATION Left 04/02/2017   Procedure: FISTULA SUPERFICIALIZATION LEFT ARM;  Surgeon: Serafina Mitchell, MD;  Location: [facility];  Service: Vascular;  Laterality: Left;  .  FISTULOGRAM Left 04/07/2016   Procedure: FISTULOGRAM;  Surgeon: Waynetta Sandy, MD;  Location: [facility];  Service: Vascular;  Laterality: Left;  . FRACTURE SURGERY     right ankle  . INSERTION OF DIALYSIS CATHETER Right 04/07/2016   Procedure: INSERTION OF DIALYSIS CATHETER;  Surgeon: Waynetta Sandy, MD;  Location: [facility];  Service: Vascular;  Laterality: Right;  . INSERTION OF DIALYSIS CATHETER Right 04/04/2017   Procedure: INSERTION OF DIALYSIS CATHETER;  Surgeon: Waynetta Sandy, MD;  Location: [facility];  Service: Vascular;  Laterality: Right;  . LIGATION OF ARTERIOVENOUS  FISTULA Left 04/14/2016   Procedure: LIGATION OF ARTERIOVENOUS  FISTULA;  Surgeon: Angelia Mould, MD;  Location: [facility];  Service: Vascular;  Laterality: Left;  . PATCH ANGIOPLASTY Left 06/19/2016   Procedure: PATCH ANGIOPLASTY LEFT ARTERIOVENOUS FISTULA;  Surgeon: Angelia Mould, MD;  Location: [facility];  Service: Vascular;  Laterality: Left;  . REVISON OF ARTERIOVENOUS FISTULA Left 06/19/2016   Procedure: REVISION SUPERFICIALIZATION OF BRACHIOCEPHALIC ARTERIOVENOUS FISTULA;  Surgeon: Angelia Mould, MD;  Location: [facility] OR;  Service: Vascular;  Laterality: Left;    Current Outpatient Medications on File Prior to Visit  Medication Sig Dispense Refill  . allopurinol (ZYLOPRIM) 100 MG tablet Take 100 mg by mouth daily.    Marland Kitchen ALPRAZolam (XANAX) 1 MG tablet Take 1 mg by mouth 2 (two) times daily.    Marland Kitchen aspirin 81 MG chewable tablet Chew 1 tablet (81 mg total) by mouth  daily.    . atorvastatin (LIPITOR) 40 MG tablet Take 40 mg by mouth daily.  3  . calcitRIOL (ROCALTROL) 0.5 MCG capsule Take 1 capsule (0.5 mcg total) by mouth daily with breakfast. 30 capsule 0  . lisinopril (PRINIVIL,ZESTRIL) 10 MG tablet Take 10 mg by mouth daily.    . multivitamin (RENA-VIT) TABS tablet Take 1 tablet by mouth daily.    . pantoprazole (PROTONIX) 40 MG tablet Take 1 tablet (40 mg total) by mouth 2 (two) times  daily before a meal. 60 tablet 1  . senna (SENOKOT) 8.6 MG TABS tablet Take 2 tablets (17.2 mg total) by mouth at bedtime. 120 each 0  . sevelamer carbonate (RENVELA) 800 MG tablet Take 2,400-4,000 mg by mouth See admin instructions. Take 5 tablets with each meal and take 3 tablets with each snack    . venlafaxine XR (EFFEXOR-XR) 75 MG 24 hr capsule Take 75 mg by mouth daily with breakfast.    . metoprolol tartrate (LOPRESSOR) 25 MG tablet Take 25 mg by mouth daily.      No current facility-administered medications on file prior to visit.     Allergies  Allergen Reactions  . Nsaids Other (See Comments)    Cannot take due to Kidney disease Kidney disease Kidney function   . 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  Currently patient is on peritoneal dialysis.  We are requested by Dr Deterding to place a hemodialysis access as a back-up.  Plan: Left upper arm AV graft August 09, 2017.  Risk benefits possible complications and procedure details including but not limited to bleeding infection graft thrombosis ischemic steal were all discussed with patient today.  She understands and agrees to proceed.  Ruta Hinds, MD [facility] Office: [phone] Pager: [phone]

## 2017-08-05 ENCOUNTER — Encounter (HOSPITAL_COMMUNITY): Payer: Self-pay | Admitting: *Deleted

## 2017-08-05 ENCOUNTER — Other Ambulatory Visit: Payer: Self-pay

## 2017-08-05 NOTE — Progress Notes (Signed)
Spoke with pt for pre-op call. Pt states she has a hx of tachycardia. Denies any other cardiac history. Pt is a type 2 diabetic, no longer being treated with medication. Pt's last A1C was 5.4 on 04/02/17. Pt states she usually checks her blood sugar at least once a week. States it usually is between 96-108. Instructed pt to check her blood sugar the morning of surgery. If blood sugar is 70 or below, treat with 1/2 cup of clear juice (apple or cranberry) and recheck blood sugar 15 minutes after drinking juice.

## 2017-08-09 ENCOUNTER — Ambulatory Visit (HOSPITAL_COMMUNITY): Payer: Medicare Other | Admitting: Certified Registered"

## 2017-08-09 ENCOUNTER — Encounter (HOSPITAL_COMMUNITY)
Admission: RE | Disposition: A | Discharge status: Home / Self Care | Payer: Self-pay | Source: Home / Self Care | Attending: Vascular Surgery

## 2017-08-09 ENCOUNTER — Other Ambulatory Visit: Payer: Self-pay

## 2017-08-09 ENCOUNTER — Encounter (HOSPITAL_COMMUNITY): Payer: Self-pay | Admitting: Certified Registered"

## 2017-08-09 ENCOUNTER — Inpatient Hospital Stay (HOSPITAL_COMMUNITY)
Admission: RE | Admit: 2017-08-09 | Discharge: 2017-08-12 | DRG: 264 | Disposition: A | Discharge status: Home / Self Care | Payer: Medicare Other | Attending: Vascular Surgery | Admitting: Vascular Surgery

## 2017-08-09 DIAGNOSIS — Z79899 Other long term (current) drug therapy: Secondary | ICD-10-CM

## 2017-08-09 DIAGNOSIS — M109 Gout, unspecified: Secondary | ICD-10-CM | POA: Diagnosis present

## 2017-08-09 DIAGNOSIS — E7800 Pure hypercholesterolemia, unspecified: Secondary | ICD-10-CM | POA: Diagnosis present

## 2017-08-09 DIAGNOSIS — K219 Gastro-esophageal reflux disease without esophagitis: Secondary | ICD-10-CM | POA: Diagnosis present

## 2017-08-09 DIAGNOSIS — Z833 Family history of diabetes mellitus: Secondary | ICD-10-CM

## 2017-08-09 DIAGNOSIS — Z7982 Long term (current) use of aspirin: Secondary | ICD-10-CM

## 2017-08-09 DIAGNOSIS — E1122 Type 2 diabetes mellitus with diabetic chronic kidney disease: Secondary | ICD-10-CM | POA: Diagnosis present

## 2017-08-09 DIAGNOSIS — F419 Anxiety disorder, unspecified: Secondary | ICD-10-CM | POA: Diagnosis present

## 2017-08-09 DIAGNOSIS — I120 Hypertensive chronic kidney disease with stage 5 chronic kidney disease or end stage renal disease: Secondary | ICD-10-CM | POA: Diagnosis present

## 2017-08-09 DIAGNOSIS — M1990 Unspecified osteoarthritis, unspecified site: Secondary | ICD-10-CM | POA: Diagnosis present

## 2017-08-09 DIAGNOSIS — Z8349 Family history of other endocrine, nutritional and metabolic diseases: Secondary | ICD-10-CM

## 2017-08-09 DIAGNOSIS — T82898A Other specified complication of vascular prosthetic devices, implants and grafts, initial encounter: Principal | ICD-10-CM | POA: Diagnosis present

## 2017-08-09 DIAGNOSIS — N186 End stage renal disease: Secondary | ICD-10-CM | POA: Diagnosis present

## 2017-08-09 DIAGNOSIS — Y832 Surgical operation with anastomosis, bypass or graft as the cause of abnormal reaction of the patient, or of later complication, without mention of misadventure at the time of the procedure: Secondary | ICD-10-CM | POA: Diagnosis present

## 2017-08-09 DIAGNOSIS — Z8249 Family history of ischemic heart disease and other diseases of the circulatory system: Secondary | ICD-10-CM

## 2017-08-09 DIAGNOSIS — Z992 Dependence on renal dialysis: Secondary | ICD-10-CM | POA: Diagnosis present

## 2017-08-09 DIAGNOSIS — N185 Chronic kidney disease, stage 5: Secondary | ICD-10-CM

## 2017-08-09 HISTORY — DX: Cardiac arrhythmia, unspecified: I49.9

## 2017-08-09 HISTORY — PX: AV FISTULA PLACEMENT: SHX1204

## 2017-08-09 HISTORY — DX: Unspecified osteoarthritis, unspecified site: M19.90

## 2017-08-09 LAB — GLUCOSE, CAPILLARY
GLUCOSE-CAPILLARY: 81 mg/dL (ref 70–99)
GLUCOSE-CAPILLARY: 110 mg/dL — AB (ref 70–99)
GLUCOSE-CAPILLARY: 75 mg/dL (ref 70–99)
Glucose-Capillary: 100 mg/dL — ABNORMAL HIGH (ref 70–99)

## 2017-08-09 LAB — POCT I-STAT 4, (NA,K, GLUC, HGB,HCT)
GLUCOSE: 96 mg/dL (ref 70–99)
HEMATOCRIT: 33 % — AB (ref 36.0–46.0)
Hemoglobin: 11.2 g/dL — ABNORMAL LOW (ref 12.0–15.0)
Potassium: 5.4 mmol/L — ABNORMAL HIGH (ref 3.5–5.1)
SODIUM: 135 mmol/L (ref 135–145)

## 2017-08-09 SURGERY — INSERTION OF ARTERIOVENOUS (AV) GORE-TEX GRAFT ARM
Anesthesia: Monitor Anesthesia Care | Site: Arm Upper | Laterality: Left

## 2017-08-09 MED ORDER — ONDANSETRON HCL 4 MG PO TABS
8.0000 mg | ORAL_TABLET | Freq: Three times a day (TID) | ORAL | Status: DC | PRN
  Administered 2017-08-12: 8 mg via ORAL
  Filled 2017-08-09: qty 2
  Filled 2017-08-09: qty 1

## 2017-08-09 MED ORDER — LISINOPRIL 20 MG PO TABS
20.0000 mg | ORAL_TABLET | Freq: Every day | ORAL | Status: DC
  Administered 2017-08-10 – 2017-08-12 (×2): 20 mg via ORAL
  Filled 2017-08-09 (×3): qty 1

## 2017-08-09 MED ORDER — SEVELAMER CARBONATE 800 MG PO TABS
3200.0000 mg | ORAL_TABLET | Freq: Three times a day (TID) | ORAL | Status: DC
  Administered 2017-08-09 – 2017-08-12 (×5): 3200 mg via ORAL
  Filled 2017-08-09 (×7): qty 4

## 2017-08-09 MED ORDER — OXYCODONE HCL 5 MG PO TABS
5.0000 mg | ORAL_TABLET | ORAL | Status: DC | PRN
  Administered 2017-08-09: 5 mg via ORAL
  Administered 2017-08-09 – 2017-08-11 (×6): 10 mg via ORAL
  Administered 2017-08-11: 5 mg via ORAL
  Administered 2017-08-11 – 2017-08-12 (×4): 10 mg via ORAL
  Filled 2017-08-09 (×11): qty 2

## 2017-08-09 MED ORDER — SODIUM CHLORIDE 0.9 % IV SOLN
INTRAVENOUS | Status: DC | PRN
  Administered 2017-08-09: 500 mL

## 2017-08-09 MED ORDER — DIPHENHYDRAMINE HCL 25 MG PO CAPS
50.0000 mg | ORAL_CAPSULE | Freq: Every day | ORAL | Status: DC
  Administered 2017-08-09 – 2017-08-11 (×3): 50 mg via ORAL
  Filled 2017-08-09 (×3): qty 2

## 2017-08-09 MED ORDER — ROCURONIUM BROMIDE 10 MG/ML (PF) SYRINGE
PREFILLED_SYRINGE | INTRAVENOUS | Status: AC
  Filled 2017-08-09: qty 10

## 2017-08-09 MED ORDER — PANTOPRAZOLE SODIUM 40 MG PO TBEC
40.0000 mg | DELAYED_RELEASE_TABLET | Freq: Every day | ORAL | Status: DC
  Administered 2017-08-09 – 2017-08-12 (×4): 40 mg via ORAL
  Filled 2017-08-09 (×4): qty 1

## 2017-08-09 MED ORDER — HYDROMORPHONE HCL 1 MG/ML IJ SOLN
0.2500 mg | INTRAMUSCULAR | Status: DC | PRN
  Administered 2017-08-09 (×3): 0.25 mg via INTRAVENOUS

## 2017-08-09 MED ORDER — DOCUSATE SODIUM 100 MG PO CAPS
100.0000 mg | ORAL_CAPSULE | Freq: Two times a day (BID) | ORAL | Status: DC
  Administered 2017-08-09 – 2017-08-12 (×5): 100 mg via ORAL
  Filled 2017-08-09 (×5): qty 1

## 2017-08-09 MED ORDER — POTASSIUM CHLORIDE CRYS ER 20 MEQ PO TBCR: 20.0000 meq | EXTENDED_RELEASE_TABLET | Freq: Once | ORAL | Status: DC

## 2017-08-09 MED ORDER — PROPOFOL 10 MG/ML IV BOLUS
INTRAVENOUS | Status: AC
Start: 2017-08-09 — End: ?
  Filled 2017-08-09: qty 20

## 2017-08-09 MED ORDER — LABETALOL HCL 5 MG/ML IV SOLN: 10.0000 mg | INTRAVENOUS | Status: DC | PRN

## 2017-08-09 MED ORDER — CEFAZOLIN SODIUM-DEXTROSE 2-4 GM/100ML-% IV SOLN
2.0000 g | Freq: Three times a day (TID) | INTRAVENOUS | Status: DC
  Filled 2017-08-09: qty 100

## 2017-08-09 MED ORDER — CALCITRIOL 0.5 MCG PO CAPS
0.5000 ug | ORAL_CAPSULE | Freq: Every day | ORAL | Status: DC
  Administered 2017-08-10 – 2017-08-12 (×2): 0.5 ug via ORAL
  Filled 2017-08-09 (×3): qty 1

## 2017-08-09 MED ORDER — OXYCODONE HCL 5 MG PO TABS
ORAL_TABLET | ORAL | Status: AC
  Filled 2017-08-09: qty 1

## 2017-08-09 MED ORDER — SUCCINYLCHOLINE CHLORIDE 200 MG/10ML IV SOSY
PREFILLED_SYRINGE | INTRAVENOUS | Status: AC
  Filled 2017-08-09: qty 10

## 2017-08-09 MED ORDER — SENNA 8.6 MG PO TABS
2.0000 | ORAL_TABLET | Freq: Every day | ORAL | Status: DC
  Administered 2017-08-09 – 2017-08-11 (×3): 17.2 mg via ORAL
  Filled 2017-08-09 (×3): qty 2

## 2017-08-09 MED ORDER — HYDRALAZINE HCL 20 MG/ML IJ SOLN: 5.0000 mg | INTRAMUSCULAR | Status: DC | PRN

## 2017-08-09 MED ORDER — CEFAZOLIN SODIUM-DEXTROSE 2-4 GM/100ML-% IV SOLN
2.0000 g | INTRAVENOUS | Status: AC
  Administered 2017-08-09: 2 g via INTRAVENOUS

## 2017-08-09 MED ORDER — RENA-VITE PO TABS
1.0000 | ORAL_TABLET | Freq: Every day | ORAL | Status: DC
  Administered 2017-08-09 – 2017-08-12 (×4): 1 via ORAL
  Filled 2017-08-09 (×4): qty 1

## 2017-08-09 MED ORDER — OXYCODONE-ACETAMINOPHEN 5-325 MG PO TABS
ORAL_TABLET | ORAL | Status: AC
  Filled 2017-08-09: qty 1

## 2017-08-09 MED ORDER — 0.9 % SODIUM CHLORIDE (POUR BTL) OPTIME
TOPICAL | Status: DC | PRN
  Administered 2017-08-09: 1000 mL

## 2017-08-09 MED ORDER — LIDOCAINE HCL (PF) 1 % IJ SOLN
INTRAMUSCULAR | Status: DC | PRN
  Administered 2017-08-09: 21 mL
  Administered 2017-08-09: 30 mL

## 2017-08-09 MED ORDER — LIDOCAINE HCL (PF) 1 % IJ SOLN
INTRAMUSCULAR | Status: AC
  Filled 2017-08-09: qty 30

## 2017-08-09 MED ORDER — METOPROLOL TARTRATE 5 MG/5ML IV SOLN: 2.0000 mg | INTRAVENOUS | Status: DC | PRN

## 2017-08-09 MED ORDER — EPHEDRINE 5 MG/ML INJ
INTRAVENOUS | Status: AC
  Filled 2017-08-09: qty 10

## 2017-08-09 MED ORDER — PANTOPRAZOLE SODIUM 40 MG PO TBEC: 40.0000 mg | DELAYED_RELEASE_TABLET | Freq: Every day | ORAL | Status: DC

## 2017-08-09 MED ORDER — PHENOL 1.4 % MT LIQD: 1.0000 | OROMUCOSAL | Status: DC | PRN

## 2017-08-09 MED ORDER — HYDROMORPHONE HCL 1 MG/ML IJ SOLN
INTRAMUSCULAR | Status: AC
  Filled 2017-08-09: qty 1

## 2017-08-09 MED ORDER — ACETAMINOPHEN 325 MG PO TABS: 325.0000 mg | ORAL_TABLET | ORAL | Status: DC | PRN

## 2017-08-09 MED ORDER — MIDAZOLAM HCL 2 MG/2ML IJ SOLN
INTRAMUSCULAR | Status: AC
  Filled 2017-08-09: qty 2

## 2017-08-09 MED ORDER — OXYCODONE-ACETAMINOPHEN 5-325 MG PO TABS
1.0000 | ORAL_TABLET | Freq: Once | ORAL | Status: AC
Start: 2017-08-09 — End: 2017-08-09
  Administered 2017-08-09: 1 via ORAL

## 2017-08-09 MED ORDER — SODIUM CHLORIDE 0.9 % IV SOLN
INTRAVENOUS | Status: DC
  Administered 2017-08-09: 07:00:00 via INTRAVENOUS

## 2017-08-09 MED ORDER — GUAIFENESIN-DM 100-10 MG/5ML PO SYRP: 15.0000 mL | ORAL_SOLUTION | ORAL | Status: DC | PRN

## 2017-08-09 MED ORDER — CEFAZOLIN SODIUM-DEXTROSE 2-4 GM/100ML-% IV SOLN
2.0000 g | Freq: Three times a day (TID) | INTRAVENOUS | Status: AC
  Administered 2017-08-10: 2 g via INTRAVENOUS
  Filled 2017-08-09: qty 100

## 2017-08-09 MED ORDER — CEFAZOLIN SODIUM-DEXTROSE 2-4 GM/100ML-% IV SOLN
INTRAVENOUS | Status: AC
  Filled 2017-08-09: qty 100

## 2017-08-09 MED ORDER — ACETAMINOPHEN 325 MG RE SUPP
325.0000 mg | RECTAL | Status: DC | PRN
  Filled 2017-08-09: qty 2

## 2017-08-09 MED ORDER — MORPHINE SULFATE (PF) 2 MG/ML IV SOLN
2.0000 mg | INTRAVENOUS | Status: DC | PRN
  Administered 2017-08-10 – 2017-08-11 (×3): 2 mg via INTRAVENOUS
  Filled 2017-08-09 (×3): qty 1

## 2017-08-09 MED ORDER — ALPRAZOLAM 0.5 MG PO TABS
1.0000 mg | ORAL_TABLET | Freq: Two times a day (BID) | ORAL | Status: DC | PRN
  Administered 2017-08-09 – 2017-08-11 (×3): 1 mg via ORAL
  Filled 2017-08-09 (×3): qty 2

## 2017-08-09 MED ORDER — PROPOFOL 500 MG/50ML IV EMUL
INTRAVENOUS | Status: DC | PRN
  Administered 2017-08-09: 100 ug/kg/min via INTRAVENOUS

## 2017-08-09 MED ORDER — PHENYLEPHRINE 40 MCG/ML (10ML) SYRINGE FOR IV PUSH (FOR BLOOD PRESSURE SUPPORT)
PREFILLED_SYRINGE | INTRAVENOUS | Status: AC
  Filled 2017-08-09: qty 10

## 2017-08-09 MED ORDER — ALBUMIN HUMAN 5 % IV SOLN
INTRAVENOUS | Status: AC
  Filled 2017-08-09: qty 250

## 2017-08-09 MED ORDER — GENTAMICIN SULFATE 0.1 % EX CREA
1.0000 "application " | TOPICAL_CREAM | Freq: Every day | CUTANEOUS | Status: DC
  Filled 2017-08-09: qty 15

## 2017-08-09 MED ORDER — ONDANSETRON HCL 4 MG/2ML IJ SOLN: 4.0000 mg | Freq: Four times a day (QID) | INTRAMUSCULAR | Status: DC | PRN

## 2017-08-09 MED ORDER — HEPARIN SODIUM (PORCINE) 1000 UNIT/ML IJ SOLN
INTRAMUSCULAR | Status: DC | PRN
  Administered 2017-08-09: 5000 [IU] via INTRAVENOUS

## 2017-08-09 MED ORDER — FENTANYL CITRATE (PF) 250 MCG/5ML IJ SOLN
INTRAMUSCULAR | Status: AC
  Filled 2017-08-09: qty 5

## 2017-08-09 MED ORDER — ALLOPURINOL 100 MG PO TABS
100.0000 mg | ORAL_TABLET | Freq: Every day | ORAL | Status: DC
  Administered 2017-08-09 – 2017-08-12 (×4): 100 mg via ORAL
  Filled 2017-08-09 (×4): qty 1

## 2017-08-09 MED ORDER — OXYCODONE-ACETAMINOPHEN 5-325 MG PO TABS: 1.0000 | ORAL_TABLET | Freq: Four times a day (QID) | ORAL | 0 refills | Status: DC | PRN

## 2017-08-09 MED ORDER — LIDOCAINE 2% (20 MG/ML) 5 ML SYRINGE
INTRAMUSCULAR | Status: AC
  Filled 2017-08-09: qty 5

## 2017-08-09 MED ORDER — HEPARIN 1000 UNIT/ML FOR PERITONEAL DIALYSIS
INTRAPERITONEAL | Status: DC | PRN
  Filled 2017-08-09: qty 3000

## 2017-08-09 MED ORDER — ONDANSETRON HCL 4 MG/2ML IJ SOLN
INTRAMUSCULAR | Status: DC | PRN
  Administered 2017-08-09: 4 mg via INTRAVENOUS

## 2017-08-09 MED ORDER — ATORVASTATIN CALCIUM 40 MG PO TABS
40.0000 mg | ORAL_TABLET | Freq: Every day | ORAL | Status: DC
  Administered 2017-08-09 – 2017-08-12 (×4): 40 mg via ORAL
  Filled 2017-08-09 (×4): qty 1

## 2017-08-09 MED ORDER — MIDAZOLAM HCL 5 MG/5ML IJ SOLN
INTRAMUSCULAR | Status: DC | PRN
  Administered 2017-08-09: 2 mg via INTRAVENOUS

## 2017-08-09 MED ORDER — DIPHENHYDRAMINE-APAP (SLEEP) 25-500 MG PO TABS: 2.0000 | ORAL_TABLET | Freq: Every day | ORAL | Status: DC

## 2017-08-09 MED ORDER — ALBUMIN HUMAN 5 % IV SOLN
12.5000 g | Freq: Once | INTRAVENOUS | Status: AC
  Administered 2017-08-09: 12.5 g via INTRAVENOUS

## 2017-08-09 MED ORDER — SODIUM CHLORIDE 0.9 % IV SOLN
250.0000 mL | INTRAVENOUS | Status: DC | PRN
  Administered 2017-08-11: 19:00:00 via INTRAVENOUS

## 2017-08-09 MED ORDER — HEPARIN 1000 UNIT/ML FOR PERITONEAL DIALYSIS: 500.0000 [IU] | INTRAMUSCULAR | Status: DC | PRN

## 2017-08-09 MED ORDER — SODIUM CHLORIDE 0.9% FLUSH: 3.0000 mL | INTRAVENOUS | Status: DC | PRN

## 2017-08-09 MED ORDER — METOCLOPRAMIDE HCL 5 MG/ML IJ SOLN
5.0000 mg | Freq: Once | INTRAMUSCULAR | Status: AC
  Administered 2017-08-09: 5 mg via INTRAVENOUS

## 2017-08-09 MED ORDER — ASPIRIN 81 MG PO CHEW
81.0000 mg | CHEWABLE_TABLET | Freq: Every day | ORAL | Status: DC
  Administered 2017-08-09 – 2017-08-12 (×4): 81 mg via ORAL
  Filled 2017-08-09 (×4): qty 1

## 2017-08-09 MED ORDER — PHENYLEPHRINE 40 MCG/ML (10ML) SYRINGE FOR IV PUSH (FOR BLOOD PRESSURE SUPPORT)
PREFILLED_SYRINGE | INTRAVENOUS | Status: DC | PRN
  Administered 2017-08-09 (×3): 120 ug via INTRAVENOUS
  Administered 2017-08-09: 80 ug via INTRAVENOUS

## 2017-08-09 MED ORDER — VENLAFAXINE HCL ER 75 MG PO CP24
75.0000 mg | ORAL_CAPSULE | Freq: Every day | ORAL | Status: DC
  Administered 2017-08-10 – 2017-08-12 (×2): 75 mg via ORAL
  Filled 2017-08-09 (×4): qty 1

## 2017-08-09 MED ORDER — SEVELAMER CARBONATE 800 MG PO TABS: 1600.0000 mg | ORAL_TABLET | ORAL | Status: DC | PRN

## 2017-08-09 MED ORDER — LIDOCAINE 2% (20 MG/ML) 5 ML SYRINGE
INTRAMUSCULAR | Status: DC | PRN
  Administered 2017-08-09: 60 mg via INTRAVENOUS

## 2017-08-09 MED ORDER — DELFLEX-LC/1.5% DEXTROSE 344 MOSM/L IP SOLN
INTRAPERITONEAL | Status: DC
  Administered 2017-08-09: 5000 mL via INTRAPERITONEAL
  Administered 2017-08-10: 15000 mL via INTRAPERITONEAL

## 2017-08-09 MED ORDER — METOCLOPRAMIDE HCL 5 MG/ML IJ SOLN
INTRAMUSCULAR | Status: AC
  Filled 2017-08-09: qty 2

## 2017-08-09 MED ORDER — SODIUM CHLORIDE 0.9 % IV SOLN
INTRAVENOUS | Status: DC | PRN
  Administered 2017-08-09: 50 ug/min via INTRAVENOUS
  Administered 2017-08-09: 50 ug/min

## 2017-08-09 MED ORDER — FENTANYL CITRATE (PF) 250 MCG/5ML IJ SOLN
INTRAMUSCULAR | Status: DC | PRN
  Administered 2017-08-09 (×2): 25 ug via INTRAVENOUS

## 2017-08-09 MED ORDER — ALUM & MAG HYDROXIDE-SIMETH 200-200-20 MG/5ML PO SUSP
15.0000 mL | ORAL | Status: DC | PRN
  Administered 2017-08-11: 15 mL via ORAL
  Administered 2017-08-11: 30 mL via ORAL
  Filled 2017-08-09 (×2): qty 30

## 2017-08-09 MED ORDER — SEVELAMER CARBONATE 800 MG PO TABS: 1600.0000 mg | ORAL_TABLET | ORAL | Status: DC

## 2017-08-09 MED ORDER — SODIUM CHLORIDE 0.9% FLUSH
3.0000 mL | Freq: Two times a day (BID) | INTRAVENOUS | Status: DC
  Administered 2017-08-09 – 2017-08-11 (×4): 3 mL via INTRAVENOUS

## 2017-08-09 MED ORDER — METOPROLOL TARTRATE 25 MG PO TABS: 25.0000 mg | ORAL_TABLET | Freq: Every day | ORAL | Status: DC | PRN

## 2017-08-09 MED ORDER — CINACALCET HCL 30 MG PO TABS
60.0000 mg | ORAL_TABLET | Freq: Every day | ORAL | Status: DC
  Administered 2017-08-09 – 2017-08-12 (×3): 60 mg via ORAL
  Filled 2017-08-09 (×4): qty 2

## 2017-08-09 MED ORDER — PROMETHAZINE HCL 25 MG PO TABS
12.5000 mg | ORAL_TABLET | Freq: Four times a day (QID) | ORAL | Status: DC | PRN
  Filled 2017-08-09: qty 1

## 2017-08-09 SURGICAL SUPPLY — 29 items
ARMBAND PINK RESTRICT EXTREMIT (MISCELLANEOUS) ×2 IMPLANT
CANISTER SUCT 3000ML PPV (MISCELLANEOUS) ×2 IMPLANT
CANNULA VESSEL 3MM 2 BLNT TIP (CANNULA) ×2 IMPLANT
CLIP VESOCCLUDE MED 6/CT (CLIP) ×2 IMPLANT
CLIP VESOCCLUDE SM WIDE 6/CT (CLIP) ×2 IMPLANT
DECANTER SPIKE VIAL GLASS SM (MISCELLANEOUS) ×2 IMPLANT
DERMABOND ADVANCED (GAUZE/BANDAGES/DRESSINGS) ×2
DERMABOND ADVANCED .7 DNX12 (GAUZE/BANDAGES/DRESSINGS) ×2 IMPLANT
ELECT REM PT RETURN 9FT ADLT (ELECTROSURGICAL) ×2
ELECTRODE REM PT RTRN 9FT ADLT (ELECTROSURGICAL) ×1 IMPLANT
GLOVE BIO SURGEON STRL SZ7.5 (GLOVE) ×2 IMPLANT
GOWN STRL REUS W/ TWL LRG LVL3 (GOWN DISPOSABLE) ×3 IMPLANT
GOWN STRL REUS W/TWL LRG LVL3 (GOWN DISPOSABLE) ×3
GRAFT GORETEX STRT 4-7X45 (Vascular Products) ×2 IMPLANT
HEMOSTAT SPONGE AVITENE ULTRA (HEMOSTASIS) IMPLANT
KIT BASIN OR (CUSTOM PROCEDURE TRAY) ×2 IMPLANT
KIT TURNOVER KIT B (KITS) ×2 IMPLANT
LOOP VESSEL MAXI BLUE (MISCELLANEOUS) ×2 IMPLANT
NS IRRIG 1000ML POUR BTL (IV SOLUTION) ×2 IMPLANT
PACK CV ACCESS (CUSTOM PROCEDURE TRAY) ×2 IMPLANT
PAD ARMBOARD 7.5X6 YLW CONV (MISCELLANEOUS) ×4 IMPLANT
SUT PROLENE 6 0 CC (SUTURE) ×6 IMPLANT
SUT VIC AB 3-0 SH 27 (SUTURE) ×3
SUT VIC AB 3-0 SH 27X BRD (SUTURE) ×3 IMPLANT
SUT VICRYL 4-0 PS2 18IN ABS (SUTURE) ×4 IMPLANT
SYR TOOMEY 50ML (SYRINGE) IMPLANT
TOWEL GREEN STERILE (TOWEL DISPOSABLE) ×2 IMPLANT
UNDERPAD 30X30 (UNDERPADS AND DIAPERS) ×2 IMPLANT
WATER STERILE IRR 1000ML POUR (IV SOLUTION) ×2 IMPLANT

## 2017-08-09 NOTE — Anesthesia Postprocedure Evaluation (Signed)
Anesthesia Post Note  Patient: Patricia [name]  Procedure(s) Performed: INSERTION OF ARTERIOVENOUS (AV) GORE-TEX GRAFT LEFT ARM (Left Arm Upper)     Patient location during evaluation: PACU Anesthesia Type: MAC Level of consciousness: awake Pain management: pain level controlled Vital Signs Assessment: post-procedure vital signs reviewed and stable Respiratory status: spontaneous breathing Cardiovascular status: stable Postop Assessment: no headache Anesthetic complications: no    Last Vitals:  Vitals:   08/09/17 1258 08/09/17 1309  BP: 96/66 111/66  Pulse: 86 88  Resp: (!) 21 (!) 23  Temp:    SpO2: (!) 89% 99%    Last Pain:  Vitals:   08/09/17 1245  TempSrc:   PainSc: 7                  Porter Nakama

## 2017-08-09 NOTE — Interval H&P Note (Signed)
History and Physical Interval Note:  08/09/2017 7:09 AM  Patricia [name]  has presented today for surgery, with the diagnosis of end stage renal disease  The various methods of treatment have been discussed with the patient and family. After consideration of risks, benefits and other options for treatment, the patient has consented to  Procedure(s): INSERTION OF ARTERIOVENOUS (AV) GORE-TEX GRAFT ARM (Left) as a surgical intervention .  The patient's history has been reviewed, patient examined, no change in status, stable for surgery.  I have reviewed the patient's chart and labs.  Questions were answered to the patient's satisfaction.     Ruta Hinds

## 2017-08-09 NOTE — Anesthesia Preprocedure Evaluation (Addendum)
Anesthesia Evaluation  Patient identified by MRN, date of birth, ID band Patient awake    Reviewed: Allergy & Precautions, NPO status , Patient's Chart, lab work & pertinent test results  Airway Mallampati: II  TM Distance: >3 FB     Dental   Pulmonary shortness of breath,    breath sounds clear to auscultation       Cardiovascular hypertension, + dysrhythmias  Rhythm:Regular Rate:Normal     Neuro/Psych  Headaches,    GI/Hepatic GERD  ,  Endo/Other  diabetes  Renal/GU Renal disease     Musculoskeletal   Abdominal   Peds  Hematology   Anesthesia Other Findings   Reproductive/Obstetrics                             Anesthesia Physical Anesthesia Plan  ASA: III  Anesthesia Plan: MAC   Post-op Pain Management:    Induction: Intravenous  PONV Risk Score and Plan: Treatment may vary due to age or medical condition  Airway Management Planned: Simple Face Mask and Nasal Cannula  Additional Equipment:   Intra-op Plan:   Post-operative Plan:   Informed Consent: I have reviewed the patients History and Physical, chart, labs and discussed the procedure including the risks, benefits and alternatives for the proposed anesthesia with the patient or authorized representative who has indicated his/her understanding and acceptance.   Dental advisory given  Plan Discussed with: CRNA and Anesthesiologist  Anesthesia Plan Comments:         Anesthesia Quick Evaluation

## 2017-08-09 NOTE — Consult Note (Signed)
Renal Service Consult Note [facility]  Patricia [name] 08/09/2017 Sol Blazing Requesting Physician:  Dr Oneida Alar  Reason for Consult:  ESRD pt on PD w/ new LUA AVG for backup HPI: The patient is a 42 y.o. year-old with hx DM (off meds), HTN, ESRd on CCPD, gout, anemia, DJD admitted for placement of back-up graft for this PD patient.  Surgery to [place] went well but pt is c/o some numbness in the left hand post-op.  No other c/o.   ROS  denies CP  no joint pain   no HA  no blurry vision  no rash  no diarrhea  no nausea/ vomiting  no dysuria  no difficulty voiding  no change in urine color    Past Medical History  Past Medical History:  Diagnosis Date  . Acid reflux   . Anxiety   . Arthritis   . Dysrhythmia    tachycardia  . ESRD (end stage renal disease) ([place])    TTHSAT- [place]  . Gout   . Headache(784.0)    "related to chemo; sometimes weekly" (09/12/2013)  . High cholesterol   . History of blood transfusion "a couple"   "related to low counts"  . Hypertension   . Iron deficiency anemia    "get epogen shots q month" (02/20/2014)  . 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 Type II diabetes mellitus ([place])    "took me off my RX ~ 04/2013"   Past Surgical History  Past Surgical History:  Procedure Laterality Date  . ABDOMINAL HYSTERECTOMY  2010   "laparoscopic"  . ANKLE FRACTURE SURGERY Right 1994  . AV FISTULA PLACEMENT Left 04/14/2016   Procedure: ARTERIOVENOUS (AV) FISTULA CREATION;  Surgeon: Angelia Mould, MD;  Location: [place];  Service: Vascular;  Laterality: Left;  . CARDIAC CATHETERIZATION  2000's  . ESOPHAGOGASTRODUODENOSCOPY    . ESOPHAGOGASTRODUODENOSCOPY (EGD) WITH PROPOFOL Left 06/04/2017   Procedure: ESOPHAGOGASTRODUODENOSCOPY (EGD) WITH PROPOFOL;  Surgeon: Arta Silence, MD;  Location: [place];  Service: Endoscopy;  Laterality: Left;  . FISTULA SUPERFICIALIZATION  Left 04/02/2017   Procedure: FISTULA SUPERFICIALIZATION LEFT ARM;  Surgeon: Serafina Mitchell, MD;  Location: [place];  Service: Vascular;  Laterality: Left;  . FISTULOGRAM Left 04/07/2016   Procedure: FISTULOGRAM;  Surgeon: Waynetta Sandy, MD;  Location: [place];  Service: Vascular;  Laterality: Left;  . FRACTURE SURGERY     right ankle  . INSERTION OF DIALYSIS CATHETER Right 04/07/2016   Procedure: INSERTION OF DIALYSIS CATHETER;  Surgeon: Waynetta Sandy, MD;  Location: [place];  Service: Vascular;  Laterality: Right;  . INSERTION OF DIALYSIS CATHETER Right 04/04/2017   Procedure: INSERTION OF DIALYSIS CATHETER;  Surgeon: Waynetta Sandy, MD;  Location: [place];  Service: Vascular;  Laterality: Right;  . LIGATION OF ARTERIOVENOUS  FISTULA Left 04/14/2016   Procedure: LIGATION OF ARTERIOVENOUS  FISTULA;  Surgeon: Angelia Mould, MD;  Location: [place];  Service: Vascular;  Laterality: Left;  . PATCH ANGIOPLASTY Left 06/19/2016   Procedure: PATCH ANGIOPLASTY LEFT ARTERIOVENOUS FISTULA;  Surgeon: Angelia Mould, MD;  Location: [place];  Service: Vascular;  Laterality: Left;  . REVISON OF ARTERIOVENOUS FISTULA Left 06/19/2016   Procedure: REVISION SUPERFICIALIZATION OF BRACHIOCEPHALIC ARTERIOVENOUS FISTULA;  Surgeon: Angelia Mould, MD;  Location: [facility];  Service: Vascular;  Laterality: Left;   Family History  Family History  Problem Relation Age of Onset  .  Hypertension Mother   . Thyroid disease Mother   . Coronary artery disease Father   . Hypertension Father   . Diabetes Father    Social History  reports that she has never smoked. She has never used smokeless tobacco. She reports that she does not drink alcohol or use drugs. Allergies  Allergies  Allergen Reactions  . Nsaids Other (See Comments)    Cannot take due to Kidney disease, Kidney disease, Kidney function   . Tolmetin Other (See Comments)    Cannot take due to Kidney Disease   Home  medications Prior to Admission medications   Medication Sig Start Date End Date Taking? Authorizing Provider  allopurinol (ZYLOPRIM) 100 MG tablet Take 100 mg by mouth daily.   Yes [provider]  ALPRAZolam Duanne Moron) 1 MG tablet Take 1 mg by mouth 2 (two) times daily as needed for anxiety.  03/19/17  Yes [provider]  aspirin 81 MG chewable tablet Chew 1 tablet (81 mg total) by mouth daily. 10/03/13  Yes Eugenie Filler, MD  atorvastatin (LIPITOR) 40 MG tablet Take 40 mg by mouth daily. 03/14/14  Yes [provider]  calcitRIOL (ROCALTROL) 0.5 MCG capsule Take 1 capsule (0.5 mcg total) by mouth daily with breakfast. 06/04/17  Yes Bonnielee Haff, MD  cinacalcet (SENSIPAR) 60 MG tablet Take 60 mg by mouth daily.   Yes [provider]  diphenhydramine-acetaminophen (TYLENOL PM) 25-500 MG TABS tablet Take 2 tablets by mouth at bedtime.   Yes [provider]  gentamicin cream (GARAMYCIN) 0.1 % Apply 1 application topically daily.   Yes [provider]  lisinopril (PRINIVIL,ZESTRIL) 20 MG tablet Take 20 mg by mouth daily.    Yes [provider]  metoprolol tartrate (LOPRESSOR) 25 MG tablet Take 25 mg by mouth daily as needed (for high BP > 140).  04/17/16  Yes [provider]  multivitamin (RENA-VIT) TABS tablet Take 1 tablet by mouth daily.   Yes [provider]  ondansetron (ZOFRAN) 8 MG tablet Take 8 mg by mouth every 8 (eight) hours as needed for nausea or vomiting. 07/20/17  Yes [provider]  pantoprazole (PROTONIX) 40 MG tablet Take 1 tablet (40 mg total) by mouth 2 (two) times daily before a meal. Patient taking differently: Take 40 mg by mouth daily.  06/04/17  Yes Bonnielee Haff, MD  promethazine (PHENERGAN) 12.5 MG tablet Take 12.5 mg by mouth every 6 (six) hours as needed for nausea or vomiting. 07/20/17  Yes [provider]  senna (SENOKOT) 8.6 MG TABS tablet Take 2 tablets (17.2 mg total) by  mouth at bedtime. 04/15/16  Yes Nita Sells, MD  sevelamer carbonate (RENVELA) 800 MG tablet Take 1,600-3,200 mg by mouth See admin instructions. Take 3200 mg by mouth 3 times daily with meals and take 1600 mg by mouth with snacks.   Yes [provider]  venlafaxine XR (EFFEXOR-XR) 75 MG 24 hr capsule Take 75 mg by mouth daily with breakfast.   Yes [provider]  oxyCODONE-acetaminophen (PERCOCET/ROXICET) 5-325 MG tablet Take 1-2 tablets by mouth every 6 (six) hours as needed for severe pain. 08/09/17   Elam Dutch, MD   Liver Function Tests No results for input(s): AST, ALT, ALKPHOS, BILITOT, PROT, ALBUMIN in the last 168 hours. No results for input(s): LIPASE, AMYLASE in the last 168 hours. CBC Recent Labs  Lab 08/09/17 0632  HGB 11.2*  HCT 82.4*   Basic Metabolic Panel Recent Labs  Lab 08/09/17 [phone]  NA 135  K 5.4*  GLUCOSE 96   Iron/TIBC/Ferritin/ %Sat    Component Value Date/Time   IRON 63 04/06/2016 1507   TIBC 206 (L) 04/06/2016 1507   FERRITIN 498 (H) 02/20/2014 0144   IRONPCTSAT 31 04/06/2016 1507    Vitals:   08/09/17 1500 08/09/17 1515 08/09/17 1545 08/09/17 1615  BP: 128/86 119/78 116/74 110/69  Pulse: 93 96 (!) 101 95  Resp: (!) $RemoveB'21 20 16 17  'XVsppeVt$ Temp:      TempSrc:      SpO2: 98% 100% 99% 98%  Weight:      Height:       Exam Gen alert obese no distress No rash, cyanosis or gangrene Sclera anicteric, throat clear   No jvd or bruits Chest clear bilat RRR no MRG Abd soft ntnd no mass or ascites +bs GU defer MS no joint effusions or deformity Ext no LE or UE edema, no wounds or ulcers Neuro is alert, Ox 3 , nf LUA AVG +bruit    Home meds:  - lisinopril 20 qd/ lopressor 25 bid prn  - xanax prn/ ecasa 81/ lipitor/ sensipar 60/   - PPI/ senokot renvela/ effexor $RemoveBeforeDE'75mg'[id number]$    Dialysis:  5 Exchanges, 2-4 hour pause, dwell 3000 cc , 1.5 hr overnight, usually uses 2.5% , if BP's low 1.5%     Impression: 1  SP new LUA AVG -  having some L hand symptoms, per VVS 2  ESRD on CCPD 3  HTN 4  Gout 5  DJD   Plan - PD tonight per CPPD orders, all 1.5%     Kelly Splinter MD [facility] pager [phone]   08/09/2017, 4:17 PM

## 2017-08-09 NOTE — Progress Notes (Signed)
Patient came from PACU via stretcher. Patient is alert and oriented x4. Box: 11 on telemetry. Patient belongings with patient. Call bell within reach. Will continue to monitor.  Farley Ly RN

## 2017-08-09 NOTE — Transfer of Care (Signed)
Immediate Anesthesia Transfer of Care Note  Patient: Patricia [name]  Procedure(s) Performed: INSERTION OF ARTERIOVENOUS (AV) GORE-TEX GRAFT LEFT ARM (Left Arm Upper)  Patient Location: PACU  Anesthesia Type:MAC  Level of Consciousness: awake, alert , oriented and patient cooperative  Airway & Oxygen Therapy: Patient Spontanous Breathing and Patient connected to nasal cannula oxygen  Post-op Assessment: Report given to RN, Post -op Vital signs reviewed and stable and Patient moving all extremities  Post vital signs: Reviewed and stable  Last Vitals:  Vitals Value Taken Time  BP 84/55 08/09/2017  9:45 AM  Temp 36.3 C 08/09/2017  9:45 AM  Pulse 95 08/09/2017  9:48 AM  Resp 16 08/09/2017  9:48 AM  SpO2 100 % 08/09/2017  9:48 AM  Vitals shown include unvalidated device data.  Last Pain:  Vitals:   08/09/17 0639  TempSrc:   PainSc: 0-No pain      Patients Stated Pain Goal: 3 (92/11/94 1740)  Complications: No apparent anesthesia complications

## 2017-08-09 NOTE — Op Note (Signed)
Procedure: Left Upper Arm AV graft  Preop: ESRD  Postop: ESRD  Anesthesia: Local with sedation  Findings:4-7 mm PTFE end to side to axillary vein  Asst: Luanne Bras RNFA   Procedure Details: After adequate sedation, the left upper extremity was prepped and draped in usual sterile fashion. Local anesthesia was infiltrated in the antecubital crease and axilla.   A longitudinal incision was then made near the antecubital crease the left arm.  There were no suitable antecubital veins for outflow.   The incision was carried into the subcutaneous tissues down to level of the brachial artery.  Next the brachial artery was dissected free in the medial portion incision. The artery was  3 mm in diameter. The vessel loops were placed proximal and distal to the planned site of arteriotomy. At this point, a longitudinal incision was made in the axilla and carried through the subcutaneous tissues and fascia to expose the axillary vein.  The nerves were protected.  The vein was approximately 4-5 mm in diameter. There were 3 large branches that all came together simultaneously to form the axillary vein.  These were all controlled with loops.  Next, a subcutaneous tunnel was created connecting the upper arm to the lower arm incision in an arcing configuration over the biceps muscle.  A 4-7 mm PTFE graft was then brought through this subcutaneous tunnel. The patient was given 5000 units of intravenous heparin. After appropriate circulation time, the vessel loops were used to control the artery. A longitudinal opening was made in the right brachial artery.  The 4 mm end of the graft was beveled and sewn end to side to the artery using a 6 0 prolene.  At completion of the anastomosis the artery was forward bled, backbled and thoroughly flushed.  The anastomosis was secured, vessel loops were released and there was palpable pulse in the graft.  The graft was clamped just above the arterial anastomosis with a fistula  clamp. The graft was then pulled taut to length at the axillary incision.  The axillary vein was controlled with a fine bulldog clamp in the upper axilla proximally with the loops on the 3 branches distally.  The vein was opened longitudinally.  The distal end of the graft was then beveled and sewn end to side to the vein using a running 6 0 prolene.  Just prior to completion of the anastomosis, everything was forward bled, back bled and thoroughly flushed.  The anastomosis was secured and the fistula clamp removed from the proximal graft.  A thrill was immediately palpable in the graft.  After hemostasis was obtained, the subcutaneous tissues were reapproximated using a running 3-0 Vicryl suture. The skin was then closed with a 4 0 Vicryl subcuticular stitch. Dermabond was applied to the skin incisions.  The patient tolerated the procedure well and there were no complications.  Instrument sponge and needle count was correct at the end of the case.  The patient was taken to the recovery room in stable condition.   Ruta Hinds, MD [facility] [place] Office: [phone] Pager: [phone]

## 2017-08-09 NOTE — Progress Notes (Signed)
Pt complains of numbness of left hand. Dr. Oneida Alar is aware. Will continue to monitor pt

## 2017-08-10 ENCOUNTER — Encounter (HOSPITAL_COMMUNITY): Payer: Self-pay | Admitting: Vascular Surgery

## 2017-08-10 ENCOUNTER — Telehealth: Payer: Self-pay | Admitting: Vascular Surgery

## 2017-08-10 LAB — BASIC METABOLIC PANEL
Anion gap: 20 — ABNORMAL HIGH (ref 5–15)
BUN: 46 mg/dL — AB (ref 6–20)
CO2: 25 mmol/L (ref 22–32)
Calcium: 7.9 mg/dL — ABNORMAL LOW (ref 8.9–10.3)
Chloride: 91 mmol/L — ABNORMAL LOW (ref 98–111)
Creatinine, Ser: 15.94 mg/dL — ABNORMAL HIGH (ref 0.44–1.00)
GFR calc Af Amer: 3 mL/min — ABNORMAL LOW (ref >60)
GFR calc non Af Amer: 2 mL/min — ABNORMAL LOW (ref >60)
GLUCOSE: 102 mg/dL — AB (ref 70–99)
POTASSIUM: 4.1 mmol/L (ref 3.5–5.1)
Sodium: 136 mmol/L (ref 135–145)

## 2017-08-10 MED ORDER — DIPHENHYDRAMINE HCL 25 MG PO CAPS
25.0000 mg | ORAL_CAPSULE | Freq: Four times a day (QID) | ORAL | Status: DC | PRN
  Administered 2017-08-10 – 2017-08-11 (×3): 25 mg via ORAL
  Filled 2017-08-10 (×3): qty 1

## 2017-08-10 MED ORDER — GENTAMICIN SULFATE 0.1 % EX CREA
1.0000 "application " | TOPICAL_CREAM | Freq: Every day | CUTANEOUS | Status: DC
  Administered 2017-08-10 – 2017-08-12 (×2): 1 via TOPICAL
  Filled 2017-08-10: qty 15

## 2017-08-10 MED ORDER — DELFLEX-LC/1.5% DEXTROSE 344 MOSM/L IP SOLN: INTRAPERITONEAL | Status: DC

## 2017-08-10 MED ORDER — HEPARIN 1000 UNIT/ML FOR PERITONEAL DIALYSIS: 500.0000 [IU] | INTRAMUSCULAR | Status: DC | PRN

## 2017-08-10 NOTE — Progress Notes (Signed)
Still with some numbness in hand 1+ radial pulse Hand warm No hematoma + bruit in graft D/c home today Follow up with me Thursday If no improvement later  This week remove graft  Ruta Hinds, MD [facility] [place] Office: [phone] Pager: [phone]

## 2017-08-10 NOTE — Telephone Encounter (Signed)
Spoke with pt. To confirm appt. 08/12/17  CEF p/o 10:45am

## 2017-08-10 NOTE — Discharge Instructions (Signed)
° °  [facility]  Discharge Instructions  AV Fistula or Graft Surgery for Dialysis Access  Please refer to the following instructions for your post-procedure care. Your surgeon or physician assistant will discuss any changes with you.  Activity  You may drive the day following your surgery, if you are comfortable and no longer taking prescription pain medication. Resume full activity as the soreness in your incision resolves.  Bathing/Showering  You may shower after you go home. Keep your incision dry for 48 hours. Do not soak in a bathtub, hot tub, or swim until the incision heals completely. You may not shower if you have a hemodialysis catheter.  Incision Care  Clean your incision with mild soap and water after 48 hours. Pat the area dry with a clean towel. You do not need a bandage unless otherwise instructed. Do not apply any ointments or creams to your incision. You may have skin glue on your incision. Do not peel it off. It will come off on its own in about one week. Your arm may swell a bit after surgery. To reduce swelling use pillows to elevate your arm so it is above your heart. Your doctor will tell you if you need to lightly wrap your arm with an ACE bandage.  Diet  Resume your normal diet. There are not special food restrictions following this procedure. In order to heal from your surgery, it is CRITICAL to get adequate nutrition. Your body requires vitamins, minerals, and protein. Vegetables are the best source of vitamins and minerals. Vegetables also provide the perfect balance of protein. Processed food has little nutritional value, so try to avoid this.  Medications  Resume taking all of your medications. If your incision is causing pain, you may take over-the counter pain relievers such as acetaminophen (Tylenol). If you were prescribed a stronger pain medication, please be aware these medications can cause nausea and constipation. Prevent  nausea by taking the medication with a snack or meal. Avoid constipation by drinking plenty of fluids and eating foods with high amount of fiber, such as fruits, vegetables, and grains.  Do not take Tylenol if you are taking prescription pain medications.  Follow up Your surgeon may want to see you in the office following your access surgery. If so, this will be arranged at the time of your surgery.  Please call us immediately for any of the following conditions:  Increased pain, redness, drainage (pus) from your incision site Fever of 101 degrees or higher Severe or worsening pain at your incision site Hand pain or numbness.  Reduce your risk of vascular disease:  Stop smoking. If you would like help, call [facility] at 1-800-QUIT-NOW 3308280012) or [place] at [place] your cholesterol Maintain a desired weight Control your diabetes Keep your blood pressure down  Dialysis  It will take several weeks to several months for your new dialysis access to be ready for use. Your surgeon will determine when it is okay to use it. Your nephrologist will continue to direct your dialysis. You can continue to use your Permcath until your new access is ready for use.   08/10/2017 Patricia [name] [id number] 1975/05/07  Surgeon(s): Fields, Jessy Oto, MD  Procedure(s): INSERTION OF ARTERIOVENOUS (AV) GORE-TEX GRAFT LEFT ARM  x Do not stick graft for 4 weeks    If you have any questions, please call the office at [phone].

## 2017-08-10 NOTE — Progress Notes (Addendum)
Pt states that her left arm is starting to feel numb again and is painful 5/10. Hand feels cool and the fistula area feels warm to the touch. A bruit is present and a very faint thrill. The area is swollen. The pt stated that her arm was feeling better this AM but is now is feeling the same as yesterday. Paged MD Fields to notify awaiting response.    1320 Attempted second notification.    Contacted MD Donzetta Matters who is on call and he will assess the patient.   1430- MD Donzetta Matters assessed pt and cancelled DC order. Pt will be NPO past midnight for possible procedure tomorrow regarding graft.   Paulla Fore, RN

## 2017-08-10 NOTE — Progress Notes (Signed)
   Patient having significant left upper arm pain at site of AV graft.  She also continues to complain of left hand numbness.  She does have a palpable 1+ radial pulse on the left.  There is no sign of erythema or infection of the arm.  We will watch 1 more night.  Copeland Lapier C. Donzetta Matters, MD [facility] [place] Office: [phone] Pager: [phone]

## 2017-08-11 ENCOUNTER — Inpatient Hospital Stay (HOSPITAL_COMMUNITY): Payer: Medicare Other | Admitting: Anesthesiology

## 2017-08-11 ENCOUNTER — Encounter (HOSPITAL_COMMUNITY): Payer: Self-pay | Admitting: Certified Registered"

## 2017-08-11 ENCOUNTER — Encounter (HOSPITAL_COMMUNITY)
Admission: RE | Disposition: A | Discharge status: Home / Self Care | Payer: Self-pay | Source: Home / Self Care | Attending: Vascular Surgery

## 2017-08-11 DIAGNOSIS — M109 Gout, unspecified: Secondary | ICD-10-CM | POA: Diagnosis present

## 2017-08-11 DIAGNOSIS — Z79899 Other long term (current) drug therapy: Secondary | ICD-10-CM | POA: Diagnosis not present

## 2017-08-11 DIAGNOSIS — Z833 Family history of diabetes mellitus: Secondary | ICD-10-CM | POA: Diagnosis not present

## 2017-08-11 DIAGNOSIS — E1122 Type 2 diabetes mellitus with diabetic chronic kidney disease: Secondary | ICD-10-CM | POA: Diagnosis present

## 2017-08-11 DIAGNOSIS — Z7982 Long term (current) use of aspirin: Secondary | ICD-10-CM | POA: Diagnosis not present

## 2017-08-11 DIAGNOSIS — N185 Chronic kidney disease, stage 5: Secondary | ICD-10-CM | POA: Diagnosis not present

## 2017-08-11 DIAGNOSIS — K219 Gastro-esophageal reflux disease without esophagitis: Secondary | ICD-10-CM | POA: Diagnosis present

## 2017-08-11 DIAGNOSIS — I120 Hypertensive chronic kidney disease with stage 5 chronic kidney disease or end stage renal disease: Secondary | ICD-10-CM | POA: Diagnosis present

## 2017-08-11 DIAGNOSIS — T82898A Other specified complication of vascular prosthetic devices, implants and grafts, initial encounter: Secondary | ICD-10-CM | POA: Diagnosis present

## 2017-08-11 DIAGNOSIS — N186 End stage renal disease: Secondary | ICD-10-CM | POA: Diagnosis present

## 2017-08-11 DIAGNOSIS — Z8249 Family history of ischemic heart disease and other diseases of the circulatory system: Secondary | ICD-10-CM | POA: Diagnosis not present

## 2017-08-11 DIAGNOSIS — F419 Anxiety disorder, unspecified: Secondary | ICD-10-CM | POA: Diagnosis present

## 2017-08-11 DIAGNOSIS — Z992 Dependence on renal dialysis: Secondary | ICD-10-CM | POA: Diagnosis not present

## 2017-08-11 DIAGNOSIS — Y832 Surgical operation with anastomosis, bypass or graft as the cause of abnormal reaction of the patient, or of later complication, without mention of misadventure at the time of the procedure: Secondary | ICD-10-CM | POA: Diagnosis present

## 2017-08-11 DIAGNOSIS — E7800 Pure hypercholesterolemia, unspecified: Secondary | ICD-10-CM | POA: Diagnosis present

## 2017-08-11 DIAGNOSIS — M1990 Unspecified osteoarthritis, unspecified site: Secondary | ICD-10-CM | POA: Diagnosis present

## 2017-08-11 DIAGNOSIS — Z8349 Family history of other endocrine, nutritional and metabolic diseases: Secondary | ICD-10-CM | POA: Diagnosis not present

## 2017-08-11 HISTORY — PX: AVGG REMOVAL: SHX5153

## 2017-08-11 LAB — POCT I-STAT 4, (NA,K, GLUC, HGB,HCT)
GLUCOSE: 84 mg/dL (ref 70–99)
HCT: 24 % — ABNORMAL LOW (ref 36.0–46.0)
Hemoglobin: 8.2 g/dL — ABNORMAL LOW (ref 12.0–15.0)
POTASSIUM: 4.9 mmol/L (ref 3.5–5.1)
Sodium: 131 mmol/L — ABNORMAL LOW (ref 135–145)

## 2017-08-11 LAB — HEPATITIS B CORE ANTIBODY, TOTAL: Hep B Core Total Ab: NEGATIVE

## 2017-08-11 LAB — HEPATITIS B SURFACE ANTIBODY,QUALITATIVE: Hep B S Ab: NONREACTIVE

## 2017-08-11 LAB — MRSA PCR SCREENING: MRSA BY PCR: NEGATIVE

## 2017-08-11 LAB — GLUCOSE, CAPILLARY: GLUCOSE-CAPILLARY: 82 mg/dL (ref 70–99)

## 2017-08-11 LAB — HEPATITIS B SURFACE ANTIGEN: HEP B S AG: NEGATIVE

## 2017-08-11 SURGERY — REMOVAL OF ARTERIOVENOUS GORETEX GRAFT (AVGG)
Anesthesia: General | Laterality: Left

## 2017-08-11 MED ORDER — FENTANYL CITRATE (PF) 100 MCG/2ML IJ SOLN
INTRAMUSCULAR | Status: AC
  Filled 2017-08-11: qty 2

## 2017-08-11 MED ORDER — DELFLEX-LC/2.5% DEXTROSE 394 MOSM/L IP SOLN
INTRAPERITONEAL | Status: DC
  Administered 2017-08-12: 5000 mL via INTRAPERITONEAL

## 2017-08-11 MED ORDER — SODIUM CHLORIDE 0.9 % IV SOLN
INTRAVENOUS | Status: DC | PRN
  Administered 2017-08-11: 500 mL

## 2017-08-11 MED ORDER — MIDAZOLAM HCL 2 MG/2ML IJ SOLN
INTRAMUSCULAR | Status: DC | PRN
  Administered 2017-08-11 (×2): 1 mg via INTRAVENOUS

## 2017-08-11 MED ORDER — 0.9 % SODIUM CHLORIDE (POUR BTL) OPTIME
TOPICAL | Status: DC | PRN
  Administered 2017-08-11: 1000 mL

## 2017-08-11 MED ORDER — DIPHENHYDRAMINE HCL 25 MG PO CAPS: 25.0000 mg | ORAL_CAPSULE | Freq: Once | ORAL | Status: DC

## 2017-08-11 MED ORDER — ALBUTEROL SULFATE HFA 108 (90 BASE) MCG/ACT IN AERS
INHALATION_SPRAY | RESPIRATORY_TRACT | Status: AC
  Filled 2017-08-11: qty 6.7

## 2017-08-11 MED ORDER — FENTANYL CITRATE (PF) 250 MCG/5ML IJ SOLN
INTRAMUSCULAR | Status: AC
  Filled 2017-08-11: qty 5

## 2017-08-11 MED ORDER — FENTANYL CITRATE (PF) 250 MCG/5ML IJ SOLN
INTRAMUSCULAR | Status: DC | PRN
  Administered 2017-08-11 (×2): 50 ug via INTRAVENOUS
  Administered 2017-08-11: 25 ug via INTRAVENOUS

## 2017-08-11 MED ORDER — PROPOFOL 10 MG/ML IV BOLUS
INTRAVENOUS | Status: AC
  Filled 2017-08-11: qty 20

## 2017-08-11 MED ORDER — HEPARIN SODIUM (PORCINE) 1000 UNIT/ML IJ SOLN
INTRAMUSCULAR | Status: AC
  Filled 2017-08-11: qty 1

## 2017-08-11 MED ORDER — LIDOCAINE 2% (20 MG/ML) 5 ML SYRINGE
INTRAMUSCULAR | Status: AC
  Filled 2017-08-11: qty 10

## 2017-08-11 MED ORDER — MIDAZOLAM HCL 2 MG/2ML IJ SOLN
INTRAMUSCULAR | Status: AC
  Filled 2017-08-11: qty 2

## 2017-08-11 MED ORDER — HEPARIN 1000 UNIT/ML FOR PERITONEAL DIALYSIS: INTRAPERITONEAL | Status: DC | PRN

## 2017-08-11 MED ORDER — CEFAZOLIN SODIUM-DEXTROSE 2-3 GM-%(50ML) IV SOLR
INTRAVENOUS | Status: DC | PRN
  Administered 2017-08-11: 2 g via INTRAVENOUS

## 2017-08-11 MED ORDER — CEFAZOLIN SODIUM-DEXTROSE 2-4 GM/100ML-% IV SOLN
INTRAVENOUS | Status: AC
  Filled 2017-08-11: qty 100

## 2017-08-11 MED ORDER — SODIUM CHLORIDE 0.9 % IV SOLN
INTRAVENOUS | Status: AC
  Filled 2017-08-11: qty 1.2

## 2017-08-11 MED ORDER — PROPOFOL 10 MG/ML IV BOLUS
INTRAVENOUS | Status: DC | PRN
  Administered 2017-08-11: 30 mg via INTRAVENOUS
  Administered 2017-08-11: 200 mg via INTRAVENOUS

## 2017-08-11 MED ORDER — PROMETHAZINE HCL 25 MG/ML IJ SOLN: 6.2500 mg | INTRAMUSCULAR | Status: DC | PRN

## 2017-08-11 MED ORDER — FENTANYL CITRATE (PF) 100 MCG/2ML IJ SOLN
25.0000 ug | INTRAMUSCULAR | Status: DC | PRN
  Administered 2017-08-11 (×2): 50 ug via INTRAVENOUS

## 2017-08-11 MED ORDER — SODIUM CHLORIDE 0.9 % IV SOLN
INTRAVENOUS | Status: DC | PRN
  Administered 2017-08-11: 50 ug/min via INTRAVENOUS

## 2017-08-11 MED ORDER — ONDANSETRON HCL 4 MG/2ML IJ SOLN
INTRAMUSCULAR | Status: DC | PRN
  Administered 2017-08-11: 4 mg via INTRAVENOUS

## 2017-08-11 MED ORDER — LIDOCAINE HCL (CARDIAC) PF 100 MG/5ML IV SOSY
PREFILLED_SYRINGE | INTRAVENOUS | Status: DC | PRN
  Administered 2017-08-11: 60 mg via INTRATRACHEAL

## 2017-08-11 SURGICAL SUPPLY — 41 items
BANDAGE ACE 4X5 VEL STRL LF (GAUZE/BANDAGES/DRESSINGS) IMPLANT
CANISTER SUCT 3000ML PPV (MISCELLANEOUS) ×2 IMPLANT
CLIP VESOCCLUDE MED 6/CT (CLIP) ×2 IMPLANT
CLIP VESOCCLUDE SM WIDE 6/CT (CLIP) ×2 IMPLANT
DECANTER SPIKE VIAL GLASS SM (MISCELLANEOUS) ×1 IMPLANT
DERMABOND ADVANCED (GAUZE/BANDAGES/DRESSINGS) ×1
DERMABOND ADVANCED .7 DNX12 (GAUZE/BANDAGES/DRESSINGS) ×1 IMPLANT
ELECT REM PT RETURN 9FT ADLT (ELECTROSURGICAL) ×2
ELECTRODE REM PT RTRN 9FT ADLT (ELECTROSURGICAL) ×1 IMPLANT
GAUZE SPONGE 4X4 12PLY STRL (GAUZE/BANDAGES/DRESSINGS) ×2 IMPLANT
GLOVE BIO SURGEON STRL SZ7 (GLOVE) ×1 IMPLANT
GLOVE BIOGEL M 6.5 STRL (GLOVE) ×2 IMPLANT
GLOVE BIOGEL PI IND STRL 7.0 (GLOVE) IMPLANT
GLOVE BIOGEL PI IND STRL 7.5 (GLOVE) ×1 IMPLANT
GLOVE BIOGEL PI INDICATOR 7.0 (GLOVE) ×2
GLOVE BIOGEL PI INDICATOR 7.5 (GLOVE) ×2
GLOVE SURG SS PI 7.5 STRL IVOR (GLOVE) ×2 IMPLANT
GOWN STRL REUS W/ TWL LRG LVL3 (GOWN DISPOSABLE) ×2 IMPLANT
GOWN STRL REUS W/ TWL XL LVL3 (GOWN DISPOSABLE) ×1 IMPLANT
GOWN STRL REUS W/TWL LRG LVL3 (GOWN DISPOSABLE) ×2
GOWN STRL REUS W/TWL XL LVL3 (GOWN DISPOSABLE) ×1
HEMOSTAT SNOW SURGICEL 2X4 (HEMOSTASIS) IMPLANT
KIT BASIN OR (CUSTOM PROCEDURE TRAY) ×2 IMPLANT
KIT TURNOVER KIT B (KITS) ×2 IMPLANT
NDL HYPO 25GX1X1/2 BEV (NEEDLE) ×1 IMPLANT
NEEDLE HYPO 25GX1X1/2 BEV (NEEDLE) IMPLANT
NS IRRIG 1000ML POUR BTL (IV SOLUTION) ×2 IMPLANT
PACK CV ACCESS (CUSTOM PROCEDURE TRAY) ×2 IMPLANT
PAD ARMBOARD 7.5X6 YLW CONV (MISCELLANEOUS) ×4 IMPLANT
SUT ETHILON 3 0 PS 1 (SUTURE) IMPLANT
SUT PROLENE 5 0 C 1 24 (SUTURE) ×3 IMPLANT
SUT PROLENE 6 0 BV (SUTURE) ×1 IMPLANT
SUT VIC AB 2-0 CT1 27 (SUTURE) ×1
SUT VIC AB 2-0 CT1 TAPERPNT 27 (SUTURE) IMPLANT
SUT VIC AB 3-0 SH 27 (SUTURE) ×2
SUT VIC AB 3-0 SH 27X BRD (SUTURE) ×1 IMPLANT
SUT VIC AB 4-0 PS2 27 (SUTURE) ×1 IMPLANT
SUT VICRYL 4-0 PS2 18IN ABS (SUTURE) IMPLANT
TOWEL GREEN STERILE (TOWEL DISPOSABLE) ×2 IMPLANT
UNDERPAD 30X30 (UNDERPADS AND DIAPERS) ×2 IMPLANT
WATER STERILE IRR 1000ML POUR (IV SOLUTION) ×2 IMPLANT

## 2017-08-11 NOTE — Anesthesia Preprocedure Evaluation (Signed)
Anesthesia Evaluation  Patient identified by MRN, date of birth, ID band Patient awake    Reviewed: Allergy & Precautions, NPO status   Airway Mallampati: II  TM Distance: >3 FB     Dental   Pulmonary shortness of breath,    breath sounds clear to auscultation       Cardiovascular hypertension, (-) DOE  Rhythm:Regular Rate:Normal     Neuro/Psych  Headaches,    GI/Hepatic Neg liver ROS, GERD  ,  Endo/Other  diabetes  Renal/GU Renal disease     Musculoskeletal   Abdominal   Peds  Hematology   Anesthesia Other Findings   Reproductive/Obstetrics                             Anesthesia Physical Anesthesia Plan  ASA: III  Anesthesia Plan: MAC   Post-op Pain Management:    Induction: Intravenous  PONV Risk Score and Plan: Ondansetron and Midazolam  Airway Management Planned: Simple Face Mask  Additional Equipment:   Intra-op Plan:   Post-operative Plan:   Informed Consent:   Dental advisory given  Plan Discussed with: Anesthesiologist  Anesthesia Plan Comments:         Anesthesia Quick Evaluation

## 2017-08-11 NOTE — Progress Notes (Signed)
[facility] [place]  Subjective  - says hand pain and numbness is worse   Objective 104/63 96 98.6 F (37 C) (Oral) 18 100%  Intake/Output Summary (Last 24 hours) at 08/11/2017 0825 Last data filed at 08/11/2017 0538 Gross per 24 hour  Intake 1050 ml  Output 0 ml  Net 1050 ml   1+ left radial pulse Subjective numbness from wrist down  Assessment/Planning: Steal symptoms left arm Keep NPO Will remove graft later today  Ruta Hinds 08/11/2017 8:25 AM --  Laboratory Lab Results: Recent Labs    08/09/17 0632  HGB 11.2*  HCT 33.0*   BMET Recent Labs    08/09/17 0632 08/10/17 0355  NA 135 136  K 5.4* 4.1  CL  --  91*  CO2  --  25  GLUCOSE 96 102*  BUN  --  46*  CREATININE  --  15.94*  CALCIUM  --  7.9*    COAG Lab Results  Component Value Date   INR 1.04 04/14/2016   INR 0.98 06/18/2015   INR 1.03 01/18/2015   No results found for: PTT

## 2017-08-11 NOTE — Progress Notes (Signed)
Pt asked that MD be paged regarding chest pain. MD on call paged at this time

## 2017-08-11 NOTE — Anesthesia Postprocedure Evaluation (Signed)
Anesthesia Post Note  Patient: Patricia [name]  Procedure(s) Performed: REMOVAL OF ARTERIOVENOUS GORETEX GRAFT ([place]) (Left )     Patient location during evaluation: PACU Anesthesia Type: General Level of consciousness: awake and alert Pain management: pain level controlled Vital Signs Assessment: post-procedure vital signs reviewed and stable Respiratory status: spontaneous breathing, nonlabored ventilation, respiratory function stable and patient connected to nasal cannula oxygen Cardiovascular status: blood pressure returned to baseline and stable Postop Assessment: no apparent nausea or vomiting Anesthetic complications: no    Last Vitals:  Vitals:   08/11/17 2145 08/11/17 2212  BP: 111/67 113/66  Pulse: 93 92  Resp: 19 18  Temp: 36.7 C 37.2 C  SpO2: 99% 98%    Last Pain:  Vitals:   08/11/17 2226  TempSrc:   PainSc: 7                  Tiajuana Amass

## 2017-08-11 NOTE — Transfer of Care (Signed)
Immediate Anesthesia Transfer of Care Note  Patient: Patricia [name]  Procedure(s) Performed: REMOVAL OF ARTERIOVENOUS GORETEX GRAFT ([place]) (Left )  Patient Location: PACU  Anesthesia Type:General  Level of Consciousness: awake, alert  and oriented  Airway & Oxygen Therapy: Patient connected to nasal cannula oxygen  Post-op Assessment: Report given to RN and Post -op Vital signs reviewed and stable  Post vital signs: Reviewed and stable  Last Vitals:  Vitals Value Taken Time  BP 104/59 08/11/2017  8:43 PM  Temp    Pulse 102 08/11/2017  8:45 PM  Resp 20 08/11/2017  8:46 PM  SpO2 96 % 08/11/2017  8:45 PM  Vitals shown include unvalidated device data.  Last Pain:  Vitals:   08/11/17 1129  TempSrc:   PainSc: 5       Patients Stated Pain Goal: 2 (76/28/31 5176)  Complications: No apparent anesthesia complications

## 2017-08-11 NOTE — Progress Notes (Signed)
Pt reports chest pain but states that it is more like indigestion. Prn med given. Patient denies pain radiating to shoulder or neck.

## 2017-08-11 NOTE — Op Note (Signed)
    [name] MRN: [id number] DOB: 01-08-1975 Sex: female  08/11/2017 Pre-operative Diagnosis: left arm steal syndrome Post-operative diagnosis:  Same Surgeon:  [name] Assistants:  Leontine Locket Procedure:   Ligation and removal of left upper arm AVGG Anesthesia:  General Blood Loss:  Minimal Specimens:  none  Findings:  Improved radial artery doppler signal after graft removal.  A small cuff of the graft was left on the artery and vein.  Indications:  42 year old female who is 2 days s/p left upper arm AVGG, who has now developed steal syndrome.  She is here today to remove her graft  Procedure:  The patient was identified in the holding area and taken to [place] 11  The patient was then placed supine on the table. general anesthesia was administered.  The patient was prepped and draped in the usual sterile fashion.  A time out was called and antibiotics were administered.  The patient's axillary and brachial incision were opened by removing the Dermabond and using Metzenbaum scissors to cut the sutures.  I was easily able to identify the arterial and venous anastomoses.  I placed a baby Gregory clamp on the graft first at the level of the venous anastomosis and then transected the graft.  I left a cuff of graft on the vein and oversewed it with 5-0 Prolene in 2 layers.  Similarly on the artery a clamp was placed just above the anastomosis and the graft was transected.  The graft was then removed.  I then oversewed the cuff of graft on the arterial anastomosis with 2 layers of 5-0 Prolene.  The clamp was released and this was hemostatic.  The patient had a brisk radial artery Doppler signal.  The wound was irrigated.  Once hemostasis was satisfactory, both incisions were closed with multiple layers of 3-0 Vicryl followed by Dermabond.  There were no immediate complications   Disposition: To PACU stable   V. [name], M.D. [facility]  [place] Office: [phone] Pager:  [phone]

## 2017-08-11 NOTE — Progress Notes (Signed)
MD on call returned page- states that an EKG can be obtain if pt feels one is really needed. RN spoke with pt who wants to see if PRN Maalox makes a difference first.

## 2017-08-11 NOTE — H&P (View-Only) (Signed)
[facility] [place]  Subjective  - says hand pain and numbness is worse   Objective 104/63 96 98.6 F (37 C) (Oral) 18 100%  Intake/Output Summary (Last 24 hours) at 08/11/2017 0825 Last data filed at 08/11/2017 0538 Gross per 24 hour  Intake 1050 ml  Output 0 ml  Net 1050 ml   1+ left radial pulse Subjective numbness from wrist down  Assessment/Planning: Steal symptoms left arm Keep NPO Will remove graft later today  Ruta Hinds 08/11/2017 8:25 AM --  Laboratory Lab Results: Recent Labs    08/09/17 0632  HGB 11.2*  HCT 33.0*   BMET Recent Labs    08/09/17 0632 08/10/17 0355  NA 135 136  K 5.4* 4.1  CL  --  91*  CO2  --  25  GLUCOSE 96 102*  BUN  --  46*  CREATININE  --  15.94*  CALCIUM  --  7.9*    COAG Lab Results  Component Value Date   INR 1.04 04/14/2016   INR 0.98 06/18/2015   INR 1.03 01/18/2015   No results found for: PTT

## 2017-08-11 NOTE — Progress Notes (Signed)
Patient asleep when Rn went in to update her on care. Will continue to monitor

## 2017-08-11 NOTE — Interval H&P Note (Signed)
History and Physical Interval Note:  08/11/2017 5:39 PM  Patricia [name]  has presented today for surgery, with the diagnosis of ESRD  The various methods of treatment have been discussed with the patient and family. After consideration of risks, benefits and other options for treatment, the patient has consented to  Procedure(s): REMOVAL OF ARTERIOVENOUS GORETEX GRAFT ([place]) (Left) as a surgical intervention .  The patient's history has been reviewed, patient examined, no change in status, stable for surgery.  I have reviewed the patient's chart and labs.  Questions were answered to the patient's satisfaction.     [name]

## 2017-08-11 NOTE — Anesthesia Procedure Notes (Signed)
Procedure Name: LMA Insertion Date/Time: 08/11/2017 7:40 PM Performed by: Valetta Fuller, CRNA Pre-anesthesia Checklist: Patient identified, Emergency Drugs available, Suction available and Patient being monitored Patient Re-evaluated:Patient Re-evaluated prior to induction Oxygen Delivery Method: Circle system utilized Preoxygenation: Pre-oxygenation with 100% oxygen Induction Type: IV induction Ventilation: Mask ventilation without difficulty LMA: LMA inserted LMA Size: 4.0 Number of attempts: 1 Placement Confirmation: positive ETCO2 Tube secured with: Tape Dental Injury: Teeth and Oropharynx as per pre-operative assessment

## 2017-08-11 NOTE — Progress Notes (Addendum)
[facility] Progress Note   Subjective: Awake, C/O pain in L hand, LUE. Scheduled for removal of AVG today   Objective Vitals:   08/10/17 2101 08/11/17 0528 08/11/17 0538 08/11/17 0755  BP: 110/69 (!) 89/52 (!) 102/58 104/63  Pulse: 89 (!) 101 98 96  Resp: $Remo'18 18  18  'bNGGE$ Temp: 98.3 F (36.8 C) 98.9 F (37.2 C)  98.6 F (37 C)  TempSrc: Oral Oral  Oral  SpO2: 99% 99%  100%  Weight:      Height:       Physical Exam General: Pleasant NAD Heart: RRR Lungs: CTAB Abdomen: Active BS PD cath RUQ Drsg CDI Extremities: No LE edema Dialysis Access: PD cath, LUA AVG + bruit   Additional Objective Labs: Basic Metabolic Panel: Recent Labs  Lab 08/09/17 0632 08/10/17 0355  NA 135 136  K 5.4* 4.1  CL  --  91*  CO2  --  25  GLUCOSE 96 102*  BUN  --  46*  CREATININE  --  15.94*  CALCIUM  --  7.9*   Liver Function Tests: No results for input(s): AST, ALT, ALKPHOS, BILITOT, PROT, ALBUMIN in the last 168 hours. No results for input(s): LIPASE, AMYLASE in the last 168 hours. CBC: Recent Labs  Lab 08/09/17 0632  HGB 11.2*  HCT 33.0*   Blood Culture    Component Value Date/Time   SDES PERITONEAL DIALYSATE 06/03/2017 0240   SPECREQUEST NONE 06/03/2017 0240   CULT  06/03/2017 0240    NO GROWTH 3 DAYS Performed at [facility], [place] [address], [place], [place] [address]    REPTSTATUS 06/06/2017 FINAL 06/03/2017 0240    Cardiac Enzymes: No results for input(s): CKTOTAL, CKMB, CKMBINDEX, TROPONINI in the last 168 hours. CBG: Recent Labs  Lab 08/09/17 0945 08/09/17 1501 08/09/17 1525 08/09/17 1707  GLUCAP 81 75 110* 100*   Iron Studies: No results for input(s): IRON, TIBC, TRANSFERRIN, FERRITIN in the last 72 hours. $RemoveB'@lablastinr3'rLiJrEHs$ @ Studies/Results: No results found. Medications: . sodium chloride    . dialysis solution 2.5% low-MG/low-CA     . allopurinol  100 mg Oral Daily  . aspirin  81 mg Oral Daily  . atorvastatin  40 mg Oral Daily  .  calcitRIOL  0.5 mcg Oral Q breakfast  . cinacalcet  60 mg Oral Q breakfast  . diphenhydrAMINE  50 mg Oral QHS  . docusate sodium  100 mg Oral BID  . gentamicin cream  1 application Topical Daily  . lisinopril  20 mg Oral Daily  . multivitamin  1 tablet Oral Daily  . pantoprazole  40 mg Oral Daily  . senna  2 tablet Oral QHS  . sevelamer carbonate  3,200 mg Oral TID WC  . sodium chloride flush  3 mL Intravenous Q12H  . venlafaxine XR  75 mg Oral Q breakfast     Dialysis:  5 Exchanges, 2-4 hour pause, dwell 3000 cc , 1.5 hr overnight, usually uses 2.5% , if BP's low 1.5%   Impression: 1  SP new LUA AVG per Dr. Oneida Alar: Unfortunately developed steal syndrome in L hand, now plans in place to remove AVG. Per primary 2  ESRD on CCPD 3  HTN/Volume-weight is up, BP remains soft. Requesting to use 2.5% solution with PD tonight. Orders in place.  4  Gout 5  DJD  Disposition: Home post AVG removal.     [name] NP-C 08/11/2017, 10:05 AM  [facility] [phone]  Pt seen,  examined and agree w A/P as above.  Kelly Splinter MD [employer] pager [phone]   08/11/2017, 11:37 AM

## 2017-08-12 ENCOUNTER — Encounter: Payer: Medicare Other | Admitting: Vascular Surgery

## 2017-08-12 ENCOUNTER — Encounter (HOSPITAL_COMMUNITY): Payer: Self-pay | Admitting: Surgery

## 2017-08-12 ENCOUNTER — Encounter: Payer: Self-pay | Admitting: Vascular Surgery

## 2017-08-12 NOTE — Progress Notes (Signed)
[facility] [place]  Subjective  - hand feels better   Objective (!) 94/49 95 98.9 F (37.2 C) (Oral) 18 99%  Intake/Output Summary (Last 24 hours) at 08/12/2017 0817 Last data filed at 08/12/2017 0549 Gross per 24 hour  Intake 300 ml  Output 20 ml  Net 280 ml   2+ radial pulse No hematoma  Assessment/Planning: D/c home today Follow up in September after her appt with [place] to see if we place another graft or wait  Ruta Hinds 08/12/2017 8:17 AM --  Laboratory Lab Results: Recent Labs    08/11/17 1853  HGB 8.2*  HCT 24.0*   BMET Recent Labs    08/10/17 0355 08/11/17 1853  NA 136 131*  K 4.1 4.9  CL 91*  --   CO2 25  --   GLUCOSE 102* 84  BUN 46*  --   CREATININE 15.94*  --   CALCIUM 7.9*  --     COAG Lab Results  Component Value Date   INR 1.04 04/14/2016   INR 0.98 06/18/2015   INR 1.03 01/18/2015   No results found for: PTT

## 2017-08-12 NOTE — Progress Notes (Addendum)
[facility] Progress Note   Subjective: Seen S/P graft removal per Dr. Trula Slade. Hand feels better, better ROM. Home today.    Objective Vitals:   08/11/17 2212 08/12/17 0000 08/12/17 0549 08/12/17 0853  BP: 113/66 114/69 (!) 94/49 115/68  Pulse: 92 (!) 102 95 91  Resp: $Remo'18 18 18 18  'cPXUN$ Temp: 99 F (37.2 C) 99.2 F (37.3 C) 98.9 F (37.2 C) 97.6 F (36.4 C)  TempSrc: Oral Oral Oral Oral  SpO2: 98% 96% 99% 100%  Weight:  117.1 kg    Height:       Physical Exam General: Pleasant, NAD Heart: RRR Lungs: CTAB Abdomen: Active BS PD cath draining.  Extremities: No LE edema Dialysis Access: PD cath. AVG removed.    Additional Objective Labs: Basic Metabolic Panel: Recent Labs  Lab 08/09/17 0632 08/10/17 0355 08/11/17 1853  NA 135 136 131*  K 5.4* 4.1 4.9  CL  --  91*  --   CO2  --  25  --   GLUCOSE 96 102* 84  BUN  --  46*  --   CREATININE  --  15.94*  --   CALCIUM  --  7.9*  --    Liver Function Tests: No results for input(s): AST, ALT, ALKPHOS, BILITOT, PROT, ALBUMIN in the last 168 hours. No results for input(s): LIPASE, AMYLASE in the last 168 hours. CBC: Recent Labs  Lab 08/09/17 0632 08/11/17 1853  HGB 11.2* 8.2*  HCT 33.0* 24.0*   Blood Culture    Component Value Date/Time   SDES PERITONEAL DIALYSATE 06/03/2017 0240   SPECREQUEST NONE 06/03/2017 0240   CULT  06/03/2017 0240    NO GROWTH 3 DAYS Performed at [facility], [place] [address] [place], [place] [address]    REPTSTATUS 06/06/2017 FINAL 06/03/2017 0240    Cardiac Enzymes: No results for input(s): CKTOTAL, CKMB, CKMBINDEX, TROPONINI in the last 168 hours. CBG: Recent Labs  Lab 08/09/17 0945 08/09/17 1501 08/09/17 1525 08/09/17 1707 08/11/17 2051  GLUCAP 81 75 110* 100* 82   Iron Studies: No results for input(s): IRON, TIBC, TRANSFERRIN, FERRITIN in the last 72 hours. $RemoveB'@lablastinr3'vAXnxikH$ @ Studies/Results: No results found. Medications: . sodium chloride    .  dialysis solution 2.5% low-MG/low-CA     . allopurinol  100 mg Oral Daily  . aspirin  81 mg Oral Daily  . atorvastatin  40 mg Oral Daily  . calcitRIOL  0.5 mcg Oral Q breakfast  . cinacalcet  60 mg Oral Q breakfast  . diphenhydrAMINE  50 mg Oral QHS  . docusate sodium  100 mg Oral BID  . gentamicin cream  1 application Topical Daily  . lisinopril  20 mg Oral Daily  . multivitamin  1 tablet Oral Daily  . pantoprazole  40 mg Oral Daily  . senna  2 tablet Oral QHS  . sevelamer carbonate  3,200 mg Oral TID WC  . sodium chloride flush  3 mL Intravenous Q12H  . venlafaxine XR  75 mg Oral Q breakfast     Dialysis:5 Exchanges, 2-4 hour pause, dwell 3000 cc , 1.5 hr overnight, usually uses 2.5% , if BP's low 1.5%   Impression: 1SP new LUA AVG per Dr. Oneida Alar: Unfortunately developed steal syndrome acutely after placement and AVG was ligated and removed yesterday 08/11/17 per Dr. Trula Slade. To F/U with Dr. Oneida Alar in September for plans for future access placement.  2 ESRD on CCPD  3 HTN/Volume-weight is up, BP remains soft. Using 2.5%  solution with PD. WT 117.1 kg. Unclear how weight is climbing-doubt accuracy of weights.  4 Gout 5 DJD  Disposition: Home today  [name] NP-C 08/12/2017, 11:48 AM  [facility] [phone]  Pt seen, examined and agree w A/P as above.  Kelly Splinter MD [employer] pager [phone]   08/12/2017, 12:05 PM

## 2017-08-12 NOTE — Discharge Summary (Signed)
Discharge Summary    Patricia [name] 10/08/1975 42 y.o. female  [id number]  Admission Date: 08/09/2017  Discharge Date: 08/12/17  Physician: No att. providers found  Admission Diagnosis: end stage renal disease   HPI:   This is a 42 y.o. female sent for evaluation of her left arm AV access.  She has previously had a left radiocephalic and brachiocephalic AV fistula.  She has had several elevation procedures of this and angioplasties.  She thinks that the fistula stopped working about 3 weeks ago.  She currently is doing peritoneal dialysis.  She states that she did have some numbness and tingling in her left hand when the access was working.  This is still persistent a little bit in the fingertips but has mostly resolved.  She denies any anticoagulation medications.  Hospital Course:  The patient was admitted to the hospital and taken to the operating room on 08/09/2017 and underwent: LUA AVG.    The pt tolerated the procedure well and was transported to the PACU in good condition.   On POD 1, she had some numbness in the hand.  She had a 1+ radial pulse with warm hand.  She was scheduled to discharge with close follow up in 2 days with Dr. Oneida Alar, however, she was having significant LUA pain at the AV graft site.  She was continuing to c/o left hand numbness.  She was kept another night for close observation.  On POD 2, she states that her pain was worse.  She was made npo and taken to the OR later that day for graft removal.   After removal she did have an  Improved radial artery doppler signal after graft removal.  A small cuff of the graft was left on the artery and vein.  By the next morning, she had a 2+ radial pulse and she was discharged home.   Her hand was feeling better.   The remainder of the hospital course consisted of increasing mobilization and increasing intake of solids without difficulty.  CBC    Component Value Date/Time   WBC 7.0 06/04/2017 0517   RBC 3.68 (L)  06/04/2017 0517   HGB 8.2 (L) 08/11/2017 1853   HCT 24.0 (L) 08/11/2017 1853   PLT 196 06/04/2017 0517   MCV 91.0 06/04/2017 0517   MCH 27.7 06/04/2017 0517   MCHC 30.4 06/04/2017 0517   RDW 18.6 (H) 06/04/2017 0517   LYMPHSABS 2.2 09/01/2016 2210   MONOABS 0.6 09/01/2016 2210   EOSABS 0.1 09/01/2016 2210   BASOSABS 0.0 09/01/2016 2210    BMET    Component Value Date/Time   NA 131 (L) 08/11/2017 1853   K 4.9 08/11/2017 1853   CL 91 (L) 08/10/2017 0355   CO2 25 08/10/2017 0355   GLUCOSE 84 08/11/2017 1853   BUN 46 (H) 08/10/2017 0355   CREATININE 15.94 (H) 08/10/2017 0355   CALCIUM 7.9 (L) 08/10/2017 0355   GFRNONAA 2 (L) 08/10/2017 0355   GFRAA 3 (L) 08/10/2017 0355      Discharge Instructions    Call MD for:  redness, tenderness, or signs of infection (pain, swelling, bleeding, redness, odor or green/yellow discharge around incision site)   Complete by:  As directed    Call MD for:  severe or increased pain, loss or decreased feeling  in affected limb(s)   Complete by:  As directed    Call MD for:  temperature >100.5   Complete by:  As directed    Discharge patient  Complete by:  As directed    Discharge disposition:  01-Home or Self Care   Discharge patient date:  08/10/2017   Driving Restrictions   Complete by:  As directed    No driving for today   Increase activity slowly   Complete by:  As directed    Walk with assistance use walker or cane as needed   Lifting restrictions   Complete by:  As directed    No lifting for 4 weeks   Resume previous diet   Complete by:  As directed    may wash over wound with mild soap and water   Complete by:  As directed       Discharge Diagnosis:  end stage renal disease  Secondary Diagnosis: Patient Active Problem List   Diagnosis Date Noted  . Gastrointestinal hemorrhage with melena 06/03/2017  . Abdominal pain 06/03/2017  . Dialysis AV fistula malfunction ([place]) 04/04/2017  . ESRD (end stage renal disease) ([place])  04/01/2016  . Anemia due to chronic kidney disease 04/01/2016  . GERD (gastroesophageal reflux disease) 04/01/2016  . Hypomagnesemia 04/28/2015  . Acute renal failure superimposed on stage 3 chronic kidney disease ([place]) 04/12/2014  . Atypical chest pain   . Chest pain 02/19/2014  . Hypokalemia 02/19/2014  . Nephrotic syndrome 10/02/2013  . Dyslipidemia 10/02/2013  . PAT (paroxysmal atrial tachycardia) ([place]) 09/29/2013  . Symptomatic anemia 06/29/2013  . Edema 06/29/2013  . Nausea and vomiting 06/29/2013  . Fever, unspecified 01/31/2013  . Glomerulonephritis 01/28/2013  . Syncope 01/28/2013  . Chest pain with moderate risk of acute coronary syndrome 07/04/2011  . Shortness of breath 07/04/2011  . Morbid obesity ([place]) 07/04/2011  . HTN (hypertension) 07/04/2011  . DM (diabetes mellitus) ([place]) 07/04/2011  . Hypercholesterolemia 07/04/2011   Past Medical History:  Diagnosis Date  . Acid reflux   . Anxiety   . Arthritis   . Dysrhythmia    tachycardia  . ESRD (end stage renal disease) ([place])    TTHSAT- [place]  . Gout   . Headache(784.0)    "related to chemo; sometimes weekly" (09/12/2013)  . High cholesterol   . History of blood transfusion "a couple"   "related to low counts"  . Hypertension   . Iron deficiency anemia    "get epogen shots q month" (02/20/2014)  . 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 Type II diabetes mellitus ([place])    "took me off my RX ~ 04/2013"     Allergies as of 08/12/2017      Reactions   Nsaids Other (See Comments)   Cannot take due to Kidney disease, Kidney disease, Kidney function   Tolmetin Other (See Comments)   Cannot take due to Kidney Disease      Medication List    TAKE these medications   allopurinol 100 MG tablet Commonly known as:  ZYLOPRIM Take 100 mg by mouth daily.   ALPRAZolam 1 MG tablet Commonly known as:  XANAX Take 1 mg by mouth 2 (two) times daily as  needed for anxiety.   aspirin 81 MG chewable tablet Chew 1 tablet (81 mg total) by mouth daily.   atorvastatin 40 MG tablet Commonly known as:  LIPITOR Take 40 mg by mouth daily.   calcitRIOL 0.5 MCG capsule Commonly known as:  ROCALTROL Take 1 capsule (0.5 mcg total) by mouth daily with breakfast.   cinacalcet 60 MG tablet Commonly known as:  SENSIPAR Take 60 mg by mouth daily.   diphenhydramine-acetaminophen 25-500 MG Tabs tablet Commonly known as:  TYLENOL PM Take 2 tablets by mouth at bedtime.   gentamicin cream 0.1 % Commonly known as:  GARAMYCIN Apply 1 application topically daily.   lisinopril 20 MG tablet Commonly known as:  PRINIVIL,ZESTRIL Take 20 mg by mouth daily.   metoprolol tartrate 25 MG tablet Commonly known as:  LOPRESSOR Take 25 mg by mouth daily as needed (for high BP > 140).   multivitamin Tabs tablet Take 1 tablet by mouth daily.   ondansetron 8 MG tablet Commonly known as:  ZOFRAN Take 8 mg by mouth every 8 (eight) hours as needed for nausea or vomiting.   oxyCODONE-acetaminophen 5-325 MG tablet Commonly known as:  PERCOCET/ROXICET Take 1-2 tablets by mouth every 6 (six) hours as needed for severe pain.   pantoprazole 40 MG tablet Commonly known as:  PROTONIX Take 1 tablet (40 mg total) by mouth 2 (two) times daily before a meal. What changed:  when to take this   promethazine 12.5 MG tablet Commonly known as:  PHENERGAN Take 12.5 mg by mouth every 6 (six) hours as needed for nausea or vomiting.   senna 8.6 MG Tabs tablet Commonly known as:  SENOKOT Take 2 tablets (17.2 mg total) by mouth at bedtime.   sevelamer carbonate 800 MG tablet Commonly known as:  RENVELA Take 1,600-3,200 mg by mouth See admin instructions. Take 3200 mg by mouth 3 times daily with meals and take 1600 mg by mouth with snacks.   venlafaxine XR 75 MG 24 hr capsule Commonly known as:  EFFEXOR-XR Take 75 mg by mouth daily with breakfast.         Instructions: [facility] Discharge Instructions AV Fistula or Graft Surgery for Dialysis Access  Please refer to the following instructions for your post-procedure care. Your surgeon or physician assistant will discuss any changes with you.  Activity  You may drive the day following your surgery, if you are comfortable and no longer taking prescription pain medication. Resume full activity as the soreness in your incision resolves.  Bathing/Showering  You may shower after you go home. Keep your incision dry for 48 hours. Do not soak in a bathtub, hot tub, or swim until the incision heals completely. You may not shower if you have a hemodialysis catheter.  Incision Care  Clean your incision with mild soap and water after 48 hours. Pat the area dry with a clean towel. You do not need a bandage unless otherwise instructed. Do not apply any ointments or creams to your incision. You may have skin glue on your incision. Do not peel it off. It will come off on its own in about one week. Your arm may swell a bit after surgery. To reduce swelling use pillows to elevate your arm so it is above your heart. Your doctor will tell you if you need to lightly wrap your arm with an ACE bandage.  Diet  Resume your normal diet. There are not special food restrictions following this procedure. In order to heal from your surgery, it is CRITICAL to get adequate nutrition. Your body requires vitamins, minerals, and protein. Vegetables are the best source of vitamins and minerals. Vegetables also provide the perfect balance of protein. Processed food has little nutritional value, so try to avoid this.  Medications  Resume taking all of your medications. If your incision is causing pain, you may take over-the counter  pain relievers such as acetaminophen (Tylenol). If you were prescribed a stronger pain medication, please be aware these medications can cause nausea and  constipation. Prevent nausea by taking the medication with a snack or meal. Avoid constipation by drinking plenty of fluids and eating foods with high amount of fiber, such as fruits, vegetables, and grains. Do not take Tylenol if you are taking prescription pain medications.  Follow up Your surgeon may want to see you in the office following your access surgery. If so, this will be arranged at the time of your surgery.  Please call us immediately for any of the following conditions:  Increased pain, redness, drainage (pus) from your incision site Fever of 101 degrees or higher Severe or worsening pain at your incision site Hand pain or numbness.  Reduce your risk of vascular disease:  Stop smoking. If you would like help, call [facility] at 1-800-QUIT-NOW 352-619-1979) or [place] at [place] your cholesterol Maintain a desired weight Control your diabetes Keep your blood pressure down  Dialysis  It will take several weeks to several months for your new dialysis access to be ready for use. Your surgeon will determine when it is OK to use it. Your nephrologist will continue to direct your dialysis. You can continue to use your Permcath until your new access is ready for use.   08/12/2017 Korayma Hagwood [id number] 06-21-1975  Surgeon(s): Serafina Mitchell, MD  Procedure(s): REMOVAL OF ARTERIOVENOUS GORETEX GRAFT ([place])   If you have any questions, please call the office at [phone].  Prescriptions given: Roxicet #15 No Refill  Disposition: home  Patient's condition: is Good  Follow up: 1. Dr. Oneida Alar in September after her appt with Dr. Jimmy Footman to see if we place another graft or wait.     Leontine Locket, PA-C [facility] [phone] 08/12/2017  2:24 PM

## 2017-08-12 NOTE — Progress Notes (Signed)
Patient discharged to home, AVS reviewed, IV removed, telebox returned.  Patient has c/o nausea and feeling hot. Advised patient to make sure she took pain medicine with food. Patient began to feel better and was ready to go home.

## 2017-08-14 ENCOUNTER — Inpatient Hospital Stay (HOSPITAL_COMMUNITY)
Admission: EM | Admit: 2017-08-14 | Discharge: 2017-08-19 | DRG: 368 | Disposition: A | Discharge status: Home / Self Care | Payer: Medicare Other | Attending: Internal Medicine | Admitting: Internal Medicine

## 2017-08-14 ENCOUNTER — Emergency Department (HOSPITAL_COMMUNITY): Payer: Medicare Other

## 2017-08-14 ENCOUNTER — Encounter (HOSPITAL_COMMUNITY): Payer: Self-pay | Admitting: Emergency Medicine

## 2017-08-14 ENCOUNTER — Other Ambulatory Visit: Payer: Self-pay

## 2017-08-14 DIAGNOSIS — D899 Disorder involving the immune mechanism, unspecified: Secondary | ICD-10-CM | POA: Diagnosis present

## 2017-08-14 DIAGNOSIS — Z90710 Acquired absence of both cervix and uterus: Secondary | ICD-10-CM

## 2017-08-14 DIAGNOSIS — K219 Gastro-esophageal reflux disease without esophagitis: Secondary | ICD-10-CM | POA: Diagnosis present

## 2017-08-14 DIAGNOSIS — Z6839 Body mass index (BMI) 39.0-39.9, adult: Secondary | ICD-10-CM

## 2017-08-14 DIAGNOSIS — Z992 Dependence on renal dialysis: Secondary | ICD-10-CM | POA: Diagnosis present

## 2017-08-14 DIAGNOSIS — N186 End stage renal disease: Secondary | ICD-10-CM | POA: Diagnosis present

## 2017-08-14 DIAGNOSIS — B3781 Candidal esophagitis: Principal | ICD-10-CM | POA: Diagnosis present

## 2017-08-14 DIAGNOSIS — E1122 Type 2 diabetes mellitus with diabetic chronic kidney disease: Secondary | ICD-10-CM | POA: Diagnosis present

## 2017-08-14 DIAGNOSIS — F419 Anxiety disorder, unspecified: Secondary | ICD-10-CM | POA: Diagnosis present

## 2017-08-14 DIAGNOSIS — E669 Obesity, unspecified: Secondary | ICD-10-CM | POA: Diagnosis present

## 2017-08-14 DIAGNOSIS — R1310 Dysphagia, unspecified: Secondary | ICD-10-CM | POA: Diagnosis not present

## 2017-08-14 DIAGNOSIS — E7800 Pure hypercholesterolemia, unspecified: Secondary | ICD-10-CM | POA: Diagnosis present

## 2017-08-14 DIAGNOSIS — Z833 Family history of diabetes mellitus: Secondary | ICD-10-CM

## 2017-08-14 DIAGNOSIS — M109 Gout, unspecified: Secondary | ICD-10-CM | POA: Diagnosis present

## 2017-08-14 DIAGNOSIS — K209 Esophagitis, unspecified without bleeding: Secondary | ICD-10-CM

## 2017-08-14 DIAGNOSIS — N2581 Secondary hyperparathyroidism of renal origin: Secondary | ICD-10-CM | POA: Diagnosis present

## 2017-08-14 DIAGNOSIS — R0789 Other chest pain: Secondary | ICD-10-CM

## 2017-08-14 DIAGNOSIS — E119 Type 2 diabetes mellitus without complications: Secondary | ICD-10-CM

## 2017-08-14 DIAGNOSIS — E785 Hyperlipidemia, unspecified: Secondary | ICD-10-CM | POA: Diagnosis present

## 2017-08-14 DIAGNOSIS — I120 Hypertensive chronic kidney disease with stage 5 chronic kidney disease or end stage renal disease: Secondary | ICD-10-CM | POA: Diagnosis present

## 2017-08-14 DIAGNOSIS — D631 Anemia in chronic kidney disease: Secondary | ICD-10-CM | POA: Diagnosis present

## 2017-08-14 LAB — BASIC METABOLIC PANEL
ANION GAP: 22 — AB (ref 5–15)
BUN: 50 mg/dL — ABNORMAL HIGH (ref 6–20)
CALCIUM: 7.3 mg/dL — AB (ref 8.9–10.3)
CO2: 26 mmol/L (ref 22–32)
Chloride: 89 mmol/L — ABNORMAL LOW (ref 98–111)
Creatinine, Ser: 18.04 mg/dL — ABNORMAL HIGH (ref 0.44–1.00)
GFR calc Af Amer: 2 mL/min — ABNORMAL LOW (ref >60)
GFR calc non Af Amer: 2 mL/min — ABNORMAL LOW (ref >60)
GLUCOSE: 94 mg/dL (ref 70–99)
POTASSIUM: 4 mmol/L (ref 3.5–5.1)
Sodium: 137 mmol/L (ref 135–145)

## 2017-08-14 LAB — CBC
HEMATOCRIT: 26 % — AB (ref 36.0–46.0)
Hemoglobin: 8.3 g/dL — ABNORMAL LOW (ref 12.0–15.0)
MCH: 26.7 pg (ref 26.0–34.0)
MCHC: 31.9 g/dL (ref 30.0–36.0)
MCV: 83.6 fL (ref 78.0–100.0)
Platelets: 222 10*3/uL (ref 150–400)
RBC: 3.11 MIL/uL — ABNORMAL LOW (ref 3.87–5.11)
RDW: 15 % (ref 11.5–15.5)
WBC: 6.6 10*3/uL (ref 4.0–10.5)

## 2017-08-14 LAB — I-STAT TROPONIN, ED
TROPONIN I, POC: 0 ng/mL (ref 0.00–0.08)
Troponin i, poc: 0.01 ng/mL (ref 0.00–0.08)

## 2017-08-14 LAB — I-STAT BETA HCG BLOOD, ED (MC, WL, AP ONLY): I-stat hCG, quantitative: <5 m[IU]/mL (ref <5)

## 2017-08-14 MED ORDER — DIPHENHYDRAMINE HCL 50 MG/ML IJ SOLN
25.0000 mg | Freq: Once | INTRAMUSCULAR | Status: AC
  Administered 2017-08-14: 25 mg via INTRAVENOUS
  Filled 2017-08-14: qty 1

## 2017-08-14 MED ORDER — ALUM & MAG HYDROXIDE-SIMETH 200-200-20 MG/5ML PO SUSP: 30.0000 mL | Freq: Once | ORAL | Status: DC

## 2017-08-14 MED ORDER — SUCRALFATE 1 G PO TABS
1.0000 g | ORAL_TABLET | Freq: Once | ORAL | Status: AC
  Administered 2017-08-14: 1 g via ORAL
  Filled 2017-08-14: qty 1

## 2017-08-14 MED ORDER — OXYCODONE HCL 5 MG PO TABS
10.0000 mg | ORAL_TABLET | Freq: Once | ORAL | Status: AC
  Administered 2017-08-14: 10 mg via ORAL
  Filled 2017-08-14: qty 2

## 2017-08-14 MED ORDER — MORPHINE SULFATE (PF) 4 MG/ML IV SOLN
4.0000 mg | Freq: Once | INTRAVENOUS | Status: AC
  Administered 2017-08-14: 4 mg via INTRAVENOUS
  Filled 2017-08-14: qty 1

## 2017-08-14 MED ORDER — GI COCKTAIL ~~LOC~~
30.0000 mL | Freq: Once | ORAL | Status: AC
  Administered 2017-08-14: 30 mL via ORAL
  Filled 2017-08-14: qty 30

## 2017-08-14 MED ORDER — LIDOCAINE VISCOUS HCL 2 % MT SOLN: 15.0000 mL | Freq: Once | OROMUCOSAL | Status: DC

## 2017-08-14 NOTE — ED Notes (Signed)
Patient reports that she is having pain as she taps her chest and states "I need a GI cocktail."

## 2017-08-14 NOTE — ED Triage Notes (Signed)
Pt presents with CP that began yesterday and worsening today; took omeprazole, baking soda without improvement; reports discharge from hospital yesterday but for unrelated issues;

## 2017-08-14 NOTE — ED Notes (Signed)
Taken to CT at this time. 

## 2017-08-14 NOTE — ED Notes (Signed)
IV TEAM at bedside

## 2017-08-14 NOTE — ED Notes (Addendum)
IV team inserted IV but was unable to collect blood Phlebotomist consulted

## 2017-08-14 NOTE — ED Notes (Signed)
Pt requested ice chips and given same. Pt continues to complain of chest pain.

## 2017-08-14 NOTE — ED Provider Notes (Signed)
[facility] EMERGENCY DEPARTMENT Provider Note   CSN: [id number] Arrival date & time: 08/14/17  1718     History   Chief Complaint Chief Complaint  Patient presents with  . Chest Pain    HPI Patricia [name] is a 42 y.o. female.  HPI 42 year old female with history of ESRD, hypertension, hyperlipidemia, morbid obesity, diabetes, and anxiety presents to the emergency department today for evaluation of chest pain.  Patient reports that since last night she is been having episodes of chest pain that she describes as stabbing in quality.  Center of chest.  Episodes occur randomly and last approximately 15 minutes.  Family reports that she has total body tremulousness during these episodes and grimacing.  States pain resides nearly completely in between episodes.  Has had similar pain in the past but never quite this episodic or severe.  No history of cardiac disease that she is aware of.  No history of DVT or PE.  Was recently hospitalized Monday through Friday of last week for left lower extremity graft repair for left AV fistula.  Not anticoagulated at this time.  No fevers chills or cough.  No vomiting or diaphoresis.  Has had nausea.  States in the past she has gotten better with GI cocktail. Recent EGD with gastritis/esophagitis.  This was given in the ED with no significant relief. Pt tried omeprazole, baking soda, and alka seltzer at home with no significant relief.   Past Medical History:  Diagnosis Date  . Acid reflux   . Anxiety   . Arthritis   . Dysrhythmia    tachycardia  . ESRD (end stage renal disease) ([place])    TTHSAT- [place]  . Gout   . Headache(784.0)    "related to chemo; sometimes weekly" (09/12/2013)  . High cholesterol   . History of blood transfusion "a couple"   "related to low counts"  . Hypertension   . Iron deficiency anemia    "get epogen shots q month" (02/20/2014)  . MCGN (minimal change glomerulonephritis)    "using chemo to tx;   finished my last tx in 12/2013"  . Type II diabetes mellitus ([place])    "took me off my RX ~ 04/2013"    Patient Active Problem List   Diagnosis Date Noted  . Gastrointestinal hemorrhage with melena 06/03/2017  . Abdominal pain 06/03/2017  . Dialysis AV fistula malfunction ([place]) 04/04/2017  . ESRD (end stage renal disease) ([place]) 04/01/2016  . Anemia due to chronic kidney disease 04/01/2016  . GERD (gastroesophageal reflux disease) 04/01/2016  . Hypomagnesemia 04/28/2015  . Acute renal failure superimposed on stage 3 chronic kidney disease ([place]) 04/12/2014  . Atypical chest pain   . Chest pain 02/19/2014  . Hypokalemia 02/19/2014  . Nephrotic syndrome 10/02/2013  . Dyslipidemia 10/02/2013  . PAT (paroxysmal atrial tachycardia) ([place]) 09/29/2013  . Symptomatic anemia 06/29/2013  . Edema 06/29/2013  . Nausea and vomiting 06/29/2013  . Fever, unspecified 01/31/2013  . Glomerulonephritis 01/28/2013  . Syncope 01/28/2013  . Chest pain with moderate risk of acute coronary syndrome 07/04/2011  . Shortness of breath 07/04/2011  . Morbid obesity ([place]) 07/04/2011  . HTN (hypertension) 07/04/2011  . DM (diabetes mellitus) ([place]) 07/04/2011  . Hypercholesterolemia 07/04/2011    Past Surgical History:  Procedure Laterality Date  . ABDOMINAL HYSTERECTOMY  2010   "laparoscopic"  . ANKLE FRACTURE SURGERY Right 1994  . AV FISTULA PLACEMENT Left 04/14/2016   Procedure: ARTERIOVENOUS (AV) FISTULA CREATION;  Surgeon: Angelia Mould,  MD;  Location: [facility] OR;  Service: Vascular;  Laterality: Left;  . AV FISTULA PLACEMENT Left 08/09/2017   Procedure: INSERTION OF ARTERIOVENOUS (AV) GORE-TEX GRAFT LEFT ARM;  Surgeon: Elam Dutch, MD;  Location: [facility];  Service: Vascular;  Laterality: Left;  . [place] REMOVAL Left 08/11/2017   Procedure: REMOVAL OF ARTERIOVENOUS GORETEX GRAFT ([place]);  Surgeon: Serafina Mitchell, MD;  Location: [facility] OR;  Service: Vascular;  Laterality: Left;  . CARDIAC CATHETERIZATION   2000's  . ESOPHAGOGASTRODUODENOSCOPY    . ESOPHAGOGASTRODUODENOSCOPY (EGD) WITH PROPOFOL Left 06/04/2017   Procedure: ESOPHAGOGASTRODUODENOSCOPY (EGD) WITH PROPOFOL;  Surgeon: Arta Silence, MD;  Location: [facility];  Service: Endoscopy;  Laterality: Left;  . FISTULA SUPERFICIALIZATION Left 04/02/2017   Procedure: FISTULA SUPERFICIALIZATION LEFT ARM;  Surgeon: Serafina Mitchell, MD;  Location: [facility];  Service: Vascular;  Laterality: Left;  . FISTULOGRAM Left 04/07/2016   Procedure: FISTULOGRAM;  Surgeon: Waynetta Sandy, MD;  Location: [facility] [facility];  Service: Vascular;  Laterality: Left;  . FRACTURE SURGERY     right ankle  . INSERTION OF DIALYSIS CATHETER Right 04/07/2016   Procedure: INSERTION OF DIALYSIS CATHETER;  Surgeon: Waynetta Sandy, MD;  Location: [facility];  Service: Vascular;  Laterality: Right;  . INSERTION OF DIALYSIS CATHETER Right 04/04/2017   Procedure: INSERTION OF DIALYSIS CATHETER;  Surgeon: Waynetta Sandy, MD;  Location: [facility];  Service: Vascular;  Laterality: Right;  . LIGATION OF ARTERIOVENOUS  FISTULA Left 04/14/2016   Procedure: LIGATION OF ARTERIOVENOUS  FISTULA;  Surgeon: Angelia Mould, MD;  Location: [facility] [facility];  Service: Vascular;  Laterality: Left;  . PATCH ANGIOPLASTY Left 06/19/2016   Procedure: PATCH ANGIOPLASTY LEFT ARTERIOVENOUS FISTULA;  Surgeon: Angelia Mould, MD;  Location: [facility];  Service: Vascular;  Laterality: Left;  . REVISON OF ARTERIOVENOUS FISTULA Left 06/19/2016   Procedure: REVISION SUPERFICIALIZATION OF BRACHIOCEPHALIC ARTERIOVENOUS FISTULA;  Surgeon: Angelia Mould, MD;  Location: [facility] OR;  Service: Vascular;  Laterality: Left;     OB History    Gravida  3   Para      Term      Preterm      AB      Living        SAB      TAB      Ectopic      Multiple  3   Live Births               Home Medications    Prior to Admission medications   Medication Sig Start Date End Date Taking?  Authorizing Provider  allopurinol (ZYLOPRIM) 100 MG tablet Take 100 mg by mouth daily.   Yes [provider]  ALPRAZolam Duanne Moron) 1 MG tablet Take 1 mg by mouth 2 (two) times daily as needed for anxiety.  03/19/17  Yes [provider]  aspirin 81 MG chewable tablet Chew 1 tablet (81 mg total) by mouth daily. 10/03/13  Yes Eugenie Filler, MD  atorvastatin (LIPITOR) 40 MG tablet Take 40 mg by mouth daily. 03/14/14  Yes [provider]  calcitRIOL (ROCALTROL) 0.5 MCG capsule Take 1 capsule (0.5 mcg total) by mouth daily with breakfast. 06/04/17  Yes Bonnielee Haff, MD  cinacalcet (SENSIPAR) 60 MG tablet Take 60 mg by mouth daily.   Yes [provider]  diphenhydramine-acetaminophen (TYLENOL PM) 25-500 MG TABS tablet Take 2 tablets by mouth at bedtime.   Yes [provider]  gentamicin cream (GARAMYCIN) 0.1 % Apply 1  application topically daily.   Yes [provider]  lisinopril (PRINIVIL,ZESTRIL) 20 MG tablet Take 20 mg by mouth daily.    Yes [provider]  metoprolol tartrate (LOPRESSOR) 25 MG tablet Take 25 mg by mouth daily as needed (for high BP > 140).  04/17/16  Yes [provider]  multivitamin (RENA-VIT) TABS tablet Take 1 tablet by mouth daily.   Yes [provider]  ondansetron (ZOFRAN) 8 MG tablet Take 8 mg by mouth every 8 (eight) hours as needed for nausea or vomiting. 07/20/17  Yes [provider]  oxyCODONE-acetaminophen (PERCOCET/ROXICET) 5-325 MG tablet Take 1-2 tablets by mouth every 6 (six) hours as needed for severe pain. 08/09/17  Yes Fields, Janetta Hora, MD  pantoprazole (PROTONIX) 40 MG tablet Take 1 tablet (40 mg total) by mouth 2 (two) times daily before a meal. Patient taking differently: Take 40 mg by mouth daily.  06/04/17  Yes Osvaldo Shipper, MD  promethazine (PHENERGAN) 12.5 MG tablet Take 12.5 mg by mouth every 6 (six) hours as needed for nausea or vomiting. 07/20/17  Yes [provider]  senna (SENOKOT) 8.6 MG TABS tablet Take 2 tablets (17.2 mg total) by mouth at bedtime. 04/15/16  Yes Rhetta Mura, MD  sevelamer carbonate (RENVELA) 800 MG tablet Take 1,600-3,200 mg by mouth See admin instructions. Take 3200 mg by mouth 3 times daily with meals and take 1600 mg by mouth with snacks.   Yes [provider]  venlafaxine XR (EFFEXOR-XR) 75 MG 24 hr capsule Take 75 mg by mouth daily with breakfast.   Yes [provider]    Family History Family History  Problem Relation Age of Onset  . Hypertension Mother   . Thyroid disease Mother   . Coronary artery disease Father   . Hypertension Father   . Diabetes Father     Social History Social History   Tobacco Use  . Smoking status: Never Smoker  . Smokeless tobacco: Never Used  Substance Use Topics  . Alcohol use: No  . Drug use: No     Allergies   Nsaids and Tolmetin   Review of Systems Review of Systems  Constitutional: Negative for chills and fever.  HENT: Negative for congestion and sore throat.   Eyes: Negative for visual disturbance.  Respiratory: Negative for cough and shortness of breath.   Cardiovascular: Positive for chest pain. Negative for leg swelling.  Gastrointestinal: Positive for nausea. Negative for abdominal pain, diarrhea and vomiting.  Genitourinary: Negative for dysuria and hematuria.  Musculoskeletal: Negative for back pain and neck pain.  Skin: Negative for color change and rash.  Neurological: Negative for weakness and headaches.  All other systems reviewed and are negative.    Physical Exam Updated Vital Signs BP 110/67   Pulse 97   Temp 98.9 F (37.2 C) (Oral)   Resp 15   SpO2 100%   Physical Exam  Constitutional: She appears well-developed and well-nourished. No distress.  Morbidly obese  HENT:  Head: Normocephalic and atraumatic.  Eyes: Conjunctivae are normal.  Neck: Neck supple.  Cardiovascular: Regular rhythm and intact  distal pulses.  Pulmonary/Chest: Effort normal and breath sounds normal. No respiratory distress.  Abdominal: Soft. She exhibits no distension. There is no tenderness.  Musculoskeletal: She exhibits no edema.       Right lower leg: She exhibits no edema.       Left lower leg: She exhibits no edema.  Neurological: She is alert.  Skin: Skin is warm  and dry. Capillary refill takes less than 2 seconds.  Psychiatric: She has a normal mood and affect.  Nursing note and vitals reviewed.    ED Treatments / Results  Labs (all labs ordered are listed, but only abnormal results are displayed) Labs Reviewed  BASIC METABOLIC PANEL - Abnormal; Notable for the following components:      Result Value   Chloride 89 (*)    BUN 50 (*)    Creatinine, Ser 18.04 (*)    Calcium 7.3 (*)    GFR calc non Af Amer 2 (*)    GFR calc Af Amer 2 (*)    Anion gap 22 (*)    All other components within normal limits  CBC - Abnormal; Notable for the following components:   RBC 3.11 (*)    Hemoglobin 8.3 (*)    HCT 26.0 (*)    All other components within normal limits  I-STAT TROPONIN, ED  I-STAT BETA HCG BLOOD, ED ([facility], [facility], [facility] ONLY)  I-STAT TROPONIN, ED    EKG EKG Interpretation  Date/Time:  Saturday August 14 2017 17:21:19 EDT Ventricular Rate:  84 PR Interval:  150 QRS Duration: 88 QT Interval:  416 QTC Calculation: 491 R Axis:   28 Text Interpretation:  Normal sinus rhythm Cannot rule out Anterior infarct , age undetermined No significant change since last tracing Confirmed by Blanchie Dessert [phone]) on 08/14/2017 6:18:53 PM   Radiology Dg Chest 2 View  Result Date: 08/14/2017 CLINICAL DATA:  Pt complains of mid-left chest pain that comes and goes, worsening since yesterday. Pt states she is unable to fully lift L arm. Pt hx HTN. Nonsmoker EXAM: CHEST - 2 VIEW COMPARISON:  04/04/2017 FINDINGS: The heart size and mediastinal contours are within normal limits. Both lungs are clear. No pleural  effusion or pneumothorax. The visualized skeletal structures are unremarkable. IMPRESSION: No active cardiopulmonary disease. Electronically Signed   By: Lajean Manes M.D.   On: 08/14/2017 18:18   Ct Chest W Contrast  Result Date: 08/15/2017 CLINICAL DATA:  Acute onset of difficulty swallowing and epigastric abdominal pain. EXAM: CT CHEST WITH CONTRAST TECHNIQUE: Multidetector CT imaging of the chest was performed during intravenous contrast administration. CONTRAST:  54mL OMNIPAQUE IOHEXOL 300 MG/ML  SOLN COMPARISON:  Chest radiograph performed 08/14/2017, and CTA of the chest performed 07/04/2011 FINDINGS: Cardiovascular: The heart is normal in size. The thoracic aorta is unremarkable. The great vessels are grossly unremarkable. Mediastinum/Nodes: There is diffuse wall thickening along the distal esophagus, which may reflect esophagitis. Visualized mediastinal nodes remain normal in size. No pericardial effusion is identified. The visualized portions of the thyroid gland are unremarkable. No axillary lymphadenopathy is seen. Lungs/Pleura: Minimal scarring is noted at the lower lobes. The lungs are otherwise clear. No focal consolidation, pleural effusion or pneumothorax is seen. No masses are identified. Upper Abdomen: Trace free air is noted adjacent to the liver at the right upper quadrant. The visualized portions of the liver and spleen are otherwise unremarkable. The visualized portions of the pancreas, adrenal glands and right kidney are within normal limits. Musculoskeletal: No acute osseous abnormalities are identified. The visualized musculature is unremarkable in appearance. IMPRESSION: 1. Diffuse wall thickening along the distal esophagus, which may reflect esophagitis. Would correlate for associated symptoms. 2. Trace free air noted adjacent to the liver at the right upper quadrant. CT of the abdomen and pelvis is recommended for further evaluation, to assess for bowel perforation. 3. Minimal  scarring at the lower lung lobes. Lungs  otherwise clear. Critical Value/emergent results were called by telephone at the time of interpretation on 08/15/2017 at 12:55 am to Dr. Corrie Dandy, who verbally acknowledged these results. Electronically Signed   By: Garald Balding M.D.   On: 08/15/2017 00:57    Procedures Procedures (including critical care time)  Medications Ordered in ED Medications  famotidine (PEPCID) IVPB 20 mg premix (has no administration in time range)  morphine 4 MG/ML injection 4 mg (has no administration in time range)  gi cocktail (Maalox,Lidocaine,Donnatal) (30 mLs Oral Given 08/14/17 1919)  oxyCODONE (Oxy IR/ROXICODONE) immediate release tablet 10 mg (10 mg Oral Given 08/14/17 1958)  morphine 4 MG/ML injection 4 mg (4 mg Intravenous Given 08/14/17 2137)  sucralfate (CARAFATE) tablet 1 g (1 g Oral Given 08/14/17 2239)  diphenhydrAMINE (BENADRYL) injection 25 mg (25 mg Intravenous Given 08/14/17 2243)  iohexol (OMNIPAQUE) 300 MG/ML solution 75 mL (75 mLs Intravenous Contrast Given 08/15/17 0005)  diazepam (VALIUM) injection 2 mg (2 mg Intravenous Given 08/15/17 0040)     Initial Impression / Assessment and Plan / ED Course  I have reviewed the triage vital signs and the nursing notes.  Pertinent labs & imaging results that were available during my care of the patient were reviewed by me and considered in my medical decision making (see chart for details).    42 year old female with history of ESRD, hypertension, hyperlipidemia, morbid obesity, diabetes, and anxiety presents to the emergency department today for evaluation of chest pain.   Patient afebrile and hemodynamically stable in the emergency department.  Appears accountable.  Exam as above.  No significantly reproducible pain.  Has not no abdominal pain on exam.  Does receive peritoneal dialysis.  No rebound or guarding.  No findings to suggest SBP.  Pain is more in the center of her chest.  Episodic nature  suggestive of esophageal etiology.  Possibly esophageal spasms.  Cannot rule out esophageal/gastric ulceration or perforatoin. Given no abdominal pain will obtain CT chest w contrast. Very atypical for acs given description and episodic. ECG with no evidence ischemic changes.   CBC with chronic anemia similar to prior. No leukocytosis. Trop wnl x 2. BMP with no signficant electrolyte abnormalities. CT chest resulted with no proximal PE. No pneumomediastinum. Concern for RUQ pneumoperitoneum. Discussed with radiology who recommends CT abd/pelv w contrast to further eval for perforation. Could also be related to PD catheter given no perionitis or abdominal pain. Getting some better after IV valium. Morphine given for pain. Care handed off at bedside with Dr. Leonette Monarch at 0100. Pt awaiting repeat scan. Non toxic appearing.   Case and plan of care discussed with Dr. Maryan Rued.  Final Clinical Impressions(s) / ED Diagnoses   Final diagnoses:  None    ED Discharge Orders    None       Corrie Dandy, MD 08/15/17 3220    Blanchie Dessert, MD 08/15/17 [phone]

## 2017-08-15 ENCOUNTER — Emergency Department (HOSPITAL_COMMUNITY): Payer: Medicare Other

## 2017-08-15 ENCOUNTER — Encounter (HOSPITAL_COMMUNITY): Payer: Self-pay | Admitting: Internal Medicine

## 2017-08-15 DIAGNOSIS — D899 Disorder involving the immune mechanism, unspecified: Secondary | ICD-10-CM | POA: Diagnosis present

## 2017-08-15 DIAGNOSIS — N186 End stage renal disease: Secondary | ICD-10-CM | POA: Diagnosis present

## 2017-08-15 DIAGNOSIS — K219 Gastro-esophageal reflux disease without esophagitis: Secondary | ICD-10-CM | POA: Diagnosis present

## 2017-08-15 DIAGNOSIS — F419 Anxiety disorder, unspecified: Secondary | ICD-10-CM | POA: Diagnosis present

## 2017-08-15 DIAGNOSIS — N2581 Secondary hyperparathyroidism of renal origin: Secondary | ICD-10-CM | POA: Diagnosis present

## 2017-08-15 DIAGNOSIS — I639 Cerebral infarction, unspecified: Secondary | ICD-10-CM

## 2017-08-15 DIAGNOSIS — E1122 Type 2 diabetes mellitus with diabetic chronic kidney disease: Secondary | ICD-10-CM

## 2017-08-15 DIAGNOSIS — E7800 Pure hypercholesterolemia, unspecified: Secondary | ICD-10-CM | POA: Diagnosis present

## 2017-08-15 DIAGNOSIS — M109 Gout, unspecified: Secondary | ICD-10-CM | POA: Diagnosis present

## 2017-08-15 DIAGNOSIS — Z992 Dependence on renal dialysis: Secondary | ICD-10-CM | POA: Diagnosis not present

## 2017-08-15 DIAGNOSIS — E669 Obesity, unspecified: Secondary | ICD-10-CM | POA: Diagnosis present

## 2017-08-15 DIAGNOSIS — D631 Anemia in chronic kidney disease: Secondary | ICD-10-CM | POA: Diagnosis present

## 2017-08-15 DIAGNOSIS — R1310 Dysphagia, unspecified: Secondary | ICD-10-CM

## 2017-08-15 DIAGNOSIS — B3781 Candidal esophagitis: Secondary | ICD-10-CM | POA: Diagnosis present

## 2017-08-15 DIAGNOSIS — E785 Hyperlipidemia, unspecified: Secondary | ICD-10-CM | POA: Diagnosis present

## 2017-08-15 DIAGNOSIS — N184 Chronic kidney disease, stage 4 (severe): Secondary | ICD-10-CM | POA: Diagnosis not present

## 2017-08-15 DIAGNOSIS — I120 Hypertensive chronic kidney disease with stage 5 chronic kidney disease or end stage renal disease: Secondary | ICD-10-CM | POA: Diagnosis present

## 2017-08-15 DIAGNOSIS — Z90710 Acquired absence of both cervix and uterus: Secondary | ICD-10-CM | POA: Diagnosis not present

## 2017-08-15 DIAGNOSIS — Z6839 Body mass index (BMI) 39.0-39.9, adult: Secondary | ICD-10-CM | POA: Diagnosis not present

## 2017-08-15 DIAGNOSIS — K209 Esophagitis, unspecified: Secondary | ICD-10-CM | POA: Diagnosis not present

## 2017-08-15 DIAGNOSIS — Z833 Family history of diabetes mellitus: Secondary | ICD-10-CM | POA: Diagnosis not present

## 2017-08-15 HISTORY — DX: Cerebral infarction, unspecified: I63.9

## 2017-08-15 LAB — GLUCOSE, CAPILLARY
GLUCOSE-CAPILLARY: 97 mg/dL (ref 70–99)
GLUCOSE-CAPILLARY: 93 mg/dL (ref 70–99)
Glucose-Capillary: 73 mg/dL (ref 70–99)
Glucose-Capillary: 106 mg/dL — ABNORMAL HIGH (ref 70–99)

## 2017-08-15 MED ORDER — DIAZEPAM 5 MG/ML IJ SOLN
2.0000 mg | Freq: Once | INTRAMUSCULAR | Status: AC
  Administered 2017-08-15: 2 mg via INTRAVENOUS
  Filled 2017-08-15: qty 2

## 2017-08-15 MED ORDER — ALPRAZOLAM 0.25 MG PO TABS
1.0000 mg | ORAL_TABLET | Freq: Two times a day (BID) | ORAL | Status: DC | PRN
  Administered 2017-08-15 – 2017-08-18 (×4): 1 mg via ORAL
  Filled 2017-08-15 (×4): qty 4

## 2017-08-15 MED ORDER — OXYCODONE-ACETAMINOPHEN 5-325 MG PO TABS
1.0000 | ORAL_TABLET | Freq: Four times a day (QID) | ORAL | Status: DC | PRN
  Administered 2017-08-15 – 2017-08-18 (×6): 1 via ORAL
  Filled 2017-08-15 (×6): qty 1

## 2017-08-15 MED ORDER — RENA-VITE PO TABS
1.0000 | ORAL_TABLET | Freq: Every day | ORAL | Status: DC
  Administered 2017-08-15 – 2017-08-18 (×4): 1 via ORAL
  Filled 2017-08-15 (×4): qty 1

## 2017-08-15 MED ORDER — DIPHENHYDRAMINE-APAP (SLEEP) 25-500 MG PO TABS: 2.0000 | ORAL_TABLET | Freq: Every day | ORAL | Status: DC

## 2017-08-15 MED ORDER — FAMOTIDINE IN NACL 20-0.9 MG/50ML-% IV SOLN
20.0000 mg | Freq: Once | INTRAVENOUS | Status: AC
  Administered 2017-08-15: 20 mg via INTRAVENOUS
  Filled 2017-08-15: qty 50

## 2017-08-15 MED ORDER — HEPARIN 1000 UNIT/ML FOR PERITONEAL DIALYSIS: 500.0000 [IU] | INTRAMUSCULAR | Status: DC | PRN

## 2017-08-15 MED ORDER — SENNA 8.6 MG PO TABS
2.0000 | ORAL_TABLET | Freq: Every day | ORAL | Status: DC
  Administered 2017-08-15 – 2017-08-18 (×4): 17.2 mg via ORAL
  Filled 2017-08-15 (×4): qty 2

## 2017-08-15 MED ORDER — ACETAMINOPHEN 325 MG PO TABS: 650.0000 mg | ORAL_TABLET | Freq: Four times a day (QID) | ORAL | Status: DC | PRN

## 2017-08-15 MED ORDER — DIPHENHYDRAMINE HCL 25 MG PO CAPS
50.0000 mg | ORAL_CAPSULE | Freq: Every evening | ORAL | Status: DC | PRN
  Administered 2017-08-15: 50 mg via ORAL
  Filled 2017-08-15 (×2): qty 2

## 2017-08-15 MED ORDER — MORPHINE SULFATE (PF) 4 MG/ML IV SOLN: 4.0000 mg | Freq: Once | INTRAVENOUS | Status: DC

## 2017-08-15 MED ORDER — ATORVASTATIN CALCIUM 20 MG PO TABS
40.0000 mg | ORAL_TABLET | Freq: Every day | ORAL | Status: DC
  Administered 2017-08-15 – 2017-08-19 (×5): 40 mg via ORAL
  Filled 2017-08-15 (×5): qty 2

## 2017-08-15 MED ORDER — LISINOPRIL 20 MG PO TABS
20.0000 mg | ORAL_TABLET | Freq: Every day | ORAL | Status: DC
  Administered 2017-08-15 – 2017-08-19 (×5): 20 mg via ORAL
  Filled 2017-08-15 (×5): qty 1

## 2017-08-15 MED ORDER — SEVELAMER CARBONATE 800 MG PO TABS
3200.0000 mg | ORAL_TABLET | Freq: Three times a day (TID) | ORAL | Status: DC
  Administered 2017-08-16 – 2017-08-19 (×6): 3200 mg via ORAL
  Filled 2017-08-15 (×11): qty 4

## 2017-08-15 MED ORDER — GENTAMICIN SULFATE 0.1 % EX CREA
1.0000 "application " | TOPICAL_CREAM | Freq: Every day | CUTANEOUS | Status: DC
  Administered 2017-08-16: 1 via TOPICAL
  Filled 2017-08-15: qty 15

## 2017-08-15 MED ORDER — NITROGLYCERIN 0.4 MG SL SUBL
0.4000 mg | SUBLINGUAL_TABLET | Freq: Once | SUBLINGUAL | Status: AC
  Administered 2017-08-15: 0.4 mg via SUBLINGUAL
  Filled 2017-08-15: qty 1

## 2017-08-15 MED ORDER — DELFLEX-LC/1.5% DEXTROSE 344 MOSM/L IP SOLN: INTRAPERITONEAL | Status: DC

## 2017-08-15 MED ORDER — CALCITRIOL 0.5 MCG PO CAPS
0.5000 ug | ORAL_CAPSULE | Freq: Every day | ORAL | Status: DC
  Administered 2017-08-15 – 2017-08-19 (×5): 0.5 ug via ORAL
  Filled 2017-08-15 (×5): qty 1

## 2017-08-15 MED ORDER — ACETAMINOPHEN 500 MG PO TABS: 500.0000 mg | ORAL_TABLET | Freq: Every evening | ORAL | Status: DC | PRN

## 2017-08-15 MED ORDER — HEPARIN 1000 UNIT/ML FOR PERITONEAL DIALYSIS
INTRAPERITONEAL | Status: DC | PRN
  Filled 2017-08-15: qty 3000

## 2017-08-15 MED ORDER — IOPAMIDOL (ISOVUE-300) INJECTION 61%
INTRAVENOUS | Status: AC
  Filled 2017-08-15: qty 50

## 2017-08-15 MED ORDER — IOPAMIDOL (ISOVUE-300) INJECTION 61%
50.0000 mL | Freq: Once | INTRAVENOUS | Status: AC | PRN
  Administered 2017-08-15: 50 mL via INTRAVENOUS

## 2017-08-15 MED ORDER — IOHEXOL 300 MG/ML  SOLN
75.0000 mL | Freq: Once | INTRAMUSCULAR | Status: AC | PRN
  Administered 2017-08-15: 75 mL via INTRAVENOUS

## 2017-08-15 MED ORDER — ASPIRIN 81 MG PO CHEW
81.0000 mg | CHEWABLE_TABLET | Freq: Every day | ORAL | Status: DC
  Administered 2017-08-15 – 2017-08-19 (×5): 81 mg via ORAL
  Filled 2017-08-15 (×5): qty 1

## 2017-08-15 MED ORDER — INSULIN ASPART 100 UNIT/ML ~~LOC~~ SOLN
0.0000 [IU] | SUBCUTANEOUS | Status: DC
  Administered 2017-08-16 – 2017-08-18 (×2): 1 [IU] via SUBCUTANEOUS

## 2017-08-15 MED ORDER — HEPARIN 1000 UNIT/ML FOR PERITONEAL DIALYSIS
INTRAMUSCULAR | Status: DC | PRN
  Filled 2017-08-15: qty 5000

## 2017-08-15 MED ORDER — VENLAFAXINE HCL ER 75 MG PO CP24
75.0000 mg | ORAL_CAPSULE | Freq: Every day | ORAL | Status: DC
  Administered 2017-08-15 – 2017-08-19 (×5): 75 mg via ORAL
  Filled 2017-08-15 (×5): qty 1

## 2017-08-15 MED ORDER — FENTANYL CITRATE (PF) 100 MCG/2ML IJ SOLN
50.0000 ug | INTRAMUSCULAR | Status: AC | PRN
  Administered 2017-08-15: 50 ug via INTRAVENOUS
  Filled 2017-08-15: qty 2

## 2017-08-15 MED ORDER — ACETAMINOPHEN 650 MG RE SUPP: 650.0000 mg | Freq: Four times a day (QID) | RECTAL | Status: DC | PRN

## 2017-08-15 MED ORDER — FLUCONAZOLE IN SODIUM CHLORIDE 200-0.9 MG/100ML-% IV SOLN
200.0000 mg | INTRAVENOUS | Status: DC
  Administered 2017-08-15 – 2017-08-17 (×3): 200 mg via INTRAVENOUS
  Filled 2017-08-15 (×6): qty 100

## 2017-08-15 MED ORDER — ALLOPURINOL 100 MG PO TABS
100.0000 mg | ORAL_TABLET | Freq: Every day | ORAL | Status: DC
  Administered 2017-08-15 – 2017-08-19 (×5): 100 mg via ORAL
  Filled 2017-08-15 (×5): qty 1

## 2017-08-15 MED ORDER — SODIUM CHLORIDE 0.9 % IV SOLN
INTRAVENOUS | Status: AC
  Administered 2017-08-15: 10:00:00 via INTRAVENOUS

## 2017-08-15 MED ORDER — CINACALCET HCL 30 MG PO TABS
60.0000 mg | ORAL_TABLET | Freq: Every day | ORAL | Status: DC
  Administered 2017-08-15 – 2017-08-16 (×2): 60 mg via ORAL
  Filled 2017-08-15 (×2): qty 2

## 2017-08-15 MED ORDER — FAMOTIDINE IN NACL 20-0.9 MG/50ML-% IV SOLN
20.0000 mg | Freq: Every day | INTRAVENOUS | Status: DC
  Administered 2017-08-15 – 2017-08-18 (×4): 20 mg via INTRAVENOUS
  Filled 2017-08-15 (×5): qty 50

## 2017-08-15 MED ORDER — SEVELAMER CARBONATE 800 MG PO TABS: 1600.0000 mg | ORAL_TABLET | Freq: Two times a day (BID) | ORAL | Status: DC | PRN

## 2017-08-15 NOTE — H&P (Addendum)
TRH H&P   Patient Demographics:    Patricia [name], is a 42 y.o. female  MRN: [id number]   DOB - [birth year]  Admit Date - 08/14/2017  Outpatient Primary MD for the patient is Blair Heys, PA-C  Referring MD/NP/PA: Addison Lank  Outpatient Specialists:    Patient coming from:   home  Chief Complaint  Patient presents with  . Chest Pain      HPI:    Patricia [name]  is a 42 y.o. female, w hypertension, hyperlipidemia, Dm2 with ESRD on peritoneal dialysis, Gout, Gerd/ PUD, apparently c/o dysphagia to solids and liquids for the past few days.  Pt notes slight nausea.  Denies fever, chills, emesis, abd pain, diarrhea, brbpr, black stool.  Pt had prior EGD in the past , which showed PUD per pt.  Pt presented to ER due to persistence of dysphagia,   In ED,  Pt tx with nitro without relief ,  cardizem without relief.   Na 137, K 4.0, Bun 50, Creatinine 18.04 Wbc 6.6, Hgb 8.3, Plt 222  Pt will be admitted for dysphagia to solids and liquids. .      Review of systems:    In addition to the HPI above,  No Fever-chills, No Headache, No changes with Vision or hearing, No problems swallowing food or Liquids, No Chest pain, Cough or Shortness of Breath, No Abdominal pain,  No Blood in stool or Urine, No dysuria, No new skin rashes or bruises, No new joints pains-aches,  No new weakness, tingling, numbness in any extremity, No recent weight gain or loss, No polyuria, polydypsia or polyphagia, No significant Mental Stressors.  A full 10 point Review of Systems was done, except as stated above, all other Review of Systems were negative.   With Past History of the following :    Past Medical History:  Diagnosis Date  . Acid reflux   . Anxiety   . Arthritis   . Dysrhythmia    tachycardia  . ESRD (end stage renal disease) ([place])    [facility]SAT- [place]  . Gout   .  Headache(784.0)    "related to chemo; sometimes weekly" (09/12/2013)  . High cholesterol   . History of blood transfusion "a couple"   "related to low counts"  . Hypertension   . Iron deficiency anemia    "get epogen shots q month" (02/20/2014)  . 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 Type II diabetes mellitus ([place])    "took me off my RX ~ 04/2013"      Past Surgical History:  Procedure Laterality Date  . ABDOMINAL HYSTERECTOMY  2010   "laparoscopic"  . ANKLE FRACTURE SURGERY Right 1994  . AV FISTULA PLACEMENT Left 04/14/2016   Procedure: ARTERIOVENOUS (AV) FISTULA CREATION;  Surgeon: Angelia Mould, MD;  Location: [facility]  OR;  Service: Vascular;  Laterality: Left;  . AV FISTULA PLACEMENT Left 08/09/2017   Procedure: INSERTION OF ARTERIOVENOUS (AV) GORE-TEX GRAFT LEFT ARM;  Surgeon: Elam Dutch, MD;  Location: [place];  Service: Vascular;  Laterality: Left;  . [place] REMOVAL Left 08/11/2017   Procedure: REMOVAL OF ARTERIOVENOUS GORETEX GRAFT ([place]);  Surgeon: Serafina Mitchell, MD;  Location: [facility] OR;  Service: Vascular;  Laterality: Left;  . CARDIAC CATHETERIZATION  2000's  . ESOPHAGOGASTRODUODENOSCOPY    . ESOPHAGOGASTRODUODENOSCOPY (EGD) WITH PROPOFOL Left 06/04/2017   Procedure: ESOPHAGOGASTRODUODENOSCOPY (EGD) WITH PROPOFOL;  Surgeon: Arta Silence, MD;  Location: [place];  Service: Endoscopy;  Laterality: Left;  . FISTULA SUPERFICIALIZATION Left 04/02/2017   Procedure: FISTULA SUPERFICIALIZATION LEFT ARM;  Surgeon: Serafina Mitchell, MD;  Location: [place];  Service: Vascular;  Laterality: Left;  . FISTULOGRAM Left 04/07/2016   Procedure: FISTULOGRAM;  Surgeon: Waynetta Sandy, MD;  Location: [place];  Service: Vascular;  Laterality: Left;  . FRACTURE SURGERY     right ankle  . INSERTION OF DIALYSIS CATHETER Right 04/07/2016   Procedure: INSERTION OF DIALYSIS CATHETER;  Surgeon: Waynetta Sandy, MD;   Location: [place];  Service: Vascular;  Laterality: Right;  . INSERTION OF DIALYSIS CATHETER Right 04/04/2017   Procedure: INSERTION OF DIALYSIS CATHETER;  Surgeon: Waynetta Sandy, MD;  Location: [place];  Service: Vascular;  Laterality: Right;  . LIGATION OF ARTERIOVENOUS  FISTULA Left 04/14/2016   Procedure: LIGATION OF ARTERIOVENOUS  FISTULA;  Surgeon: Angelia Mould, MD;  Location: [place];  Service: Vascular;  Laterality: Left;  . PATCH ANGIOPLASTY Left 06/19/2016   Procedure: PATCH ANGIOPLASTY LEFT ARTERIOVENOUS FISTULA;  Surgeon: Angelia Mould, MD;  Location: [place];  Service: Vascular;  Laterality: Left;  . REVISON OF ARTERIOVENOUS FISTULA Left 06/19/2016   Procedure: REVISION SUPERFICIALIZATION OF BRACHIOCEPHALIC ARTERIOVENOUS FISTULA;  Surgeon: Angelia Mould, MD;  Location: [facility] OR;  Service: Vascular;  Laterality: Left;      Social History:     Social History   Tobacco Use  . Smoking status: Never Smoker  . Smokeless tobacco: Never Used  Substance Use Topics  . Alcohol use: No     Lives - lives with family   Mobility - walks by self   Family History :     Family History  Problem Relation Age of Onset  . Hypertension Mother   . Thyroid disease Mother   . Coronary artery disease Father   . Hypertension Father   . Diabetes Father       Home Medications:   Prior to Admission medications   Medication Sig Start Date End Date Taking? Authorizing Provider  allopurinol (ZYLOPRIM) 100 MG tablet Take 100 mg by mouth daily.   Yes [provider]  ALPRAZolam Duanne Moron) 1 MG tablet Take 1 mg by mouth 2 (two) times daily as needed for anxiety.  03/19/17  Yes [provider]  aspirin 81 MG chewable tablet Chew 1 tablet (81 mg total) by mouth daily. 10/03/13  Yes Eugenie Filler, MD  atorvastatin (LIPITOR) 40 MG tablet Take 40 mg by mouth daily. 03/14/14  Yes [provider]  calcitRIOL (ROCALTROL) 0.5 MCG capsule Take 1 capsule  (0.5 mcg total) by mouth daily with breakfast. 06/04/17  Yes Bonnielee Haff, MD  cinacalcet (SENSIPAR) 60 MG tablet Take 60 mg by mouth daily.   Yes [provider]  diphenhydramine-acetaminophen (TYLENOL PM) 25-500 MG TABS tablet Take 2 tablets by mouth at bedtime.  Yes [provider]  gentamicin cream (GARAMYCIN) 0.1 % Apply 1 application topically daily.   Yes [provider]  lisinopril (PRINIVIL,ZESTRIL) 20 MG tablet Take 20 mg by mouth daily.    Yes [provider]  metoprolol tartrate (LOPRESSOR) 25 MG tablet Take 25 mg by mouth daily as needed (for high BP > 140).  04/17/16  Yes [provider]  multivitamin (RENA-VIT) TABS tablet Take 1 tablet by mouth daily.   Yes [provider]  ondansetron (ZOFRAN) 8 MG tablet Take 8 mg by mouth every 8 (eight) hours as needed for nausea or vomiting. 07/20/17  Yes [provider]  oxyCODONE-acetaminophen (PERCOCET/ROXICET) 5-325 MG tablet Take 1-2 tablets by mouth every 6 (six) hours as needed for severe pain. 08/09/17  Yes Fields, Jessy Oto, MD  pantoprazole (PROTONIX) 40 MG tablet Take 1 tablet (40 mg total) by mouth 2 (two) times daily before a meal. Patient taking differently: Take 40 mg by mouth daily.  06/04/17  Yes Bonnielee Haff, MD  promethazine (PHENERGAN) 12.5 MG tablet Take 12.5 mg by mouth every 6 (six) hours as needed for nausea or vomiting. 07/20/17  Yes [provider]  senna (SENOKOT) 8.6 MG TABS tablet Take 2 tablets (17.2 mg total) by mouth at bedtime. 04/15/16  Yes Nita Sells, MD  sevelamer carbonate (RENVELA) 800 MG tablet Take 1,600-3,200 mg by mouth See admin instructions. Take 3200 mg by mouth 3 times daily with meals and take 1600 mg by mouth with snacks.   Yes [provider]  venlafaxine XR (EFFEXOR-XR) 75 MG 24 hr capsule Take 75 mg by mouth daily with breakfast.   Yes [provider]     Allergies:     Allergies  Allergen  Reactions  . Nsaids Other (See Comments)    Cannot take due to Kidney disease, Kidney disease, Kidney function   . Tolmetin Other (See Comments)    Cannot take due to Kidney Disease     Physical Exam:   Vitals  Blood pressure (!) 106/58, pulse 84, temperature 98.9 F (37.2 C), temperature source Oral, resp. rate 16, SpO2 94 %.   1. General  lying in bed in NAD,    2. Normal affect and insight, Not Suicidal or Homicidal, Awake Alert, Oriented X 3.  3. No F.N deficits, ALL C.Nerves Intact, Strength 5/5 all 4 extremities, Sensation intact all 4 extremities, Plantars down going.  4. Ears and Eyes appear Normal, Conjunctivae clear, PERRLA. Moist Oral Mucosa.  5. Supple Neck, No JVD, No cervical lymphadenopathy appriciated, No Carotid Bruits.  6. Symmetrical Chest wall movement, Good air movement bilaterally, CTAB.  7. RRR, No Gallops, Rubs or Murmurs, No Parasternal Heave.  8. Positive Bowel Sounds, Abdomen Soft, No tenderness, No organomegaly appriciated,No rebound -guarding or rigidity.  9.  No Cyanosis, Normal Skin Turgor, No Skin Rash or Bruise.  10. Good muscle tone,  joints appear normal , no effusions, Normal ROM.  11. No Palpable Lymph Nodes in Neck or Axillae     Data Review:    CBC Recent Labs  Lab 08/09/17 0632 08/11/17 1853 08/14/17 1821  WBC  --   --  6.6  HGB 11.2* 8.2* 8.3*  HCT 33.0* 24.0* 26.0*  PLT  --   --  222  MCV  --   --  83.6  MCH  --   --  26.7  MCHC  --   --  31.9  RDW  --   --  15.0   ------------------------------------------------------------------------------------------------------------------  Chemistries  Recent Labs  Lab 08/09/17 0632 08/10/17 0355 08/11/17 1853 08/14/17 1821  NA 135 136 131* 137  K 5.4* 4.1 4.9 4.0  CL  --  91*  --  89*  CO2  --  25  --  26  GLUCOSE 96 102* 84 94  BUN  --  46*  --  50*  CREATININE  --  15.94*  --  18.04*  CALCIUM  --  7.9*  --  7.3*    ------------------------------------------------------------------------------------------------------------------ estimated creatinine clearance is 5.2 mL/min (A) (by C-G formula based on SCr of 18.04 mg/dL (H)). ------------------------------------------------------------------------------------------------------------------ No results for input(s): TSH, T4TOTAL, T3FREE, THYROIDAB in the last 72 hours.  Invalid input(s): FREET3  Coagulation profile No results for input(s): INR, PROTIME in the last 168 hours. ------------------------------------------------------------------------------------------------------------------- No results for input(s): DDIMER in the last 72 hours. -------------------------------------------------------------------------------------------------------------------  Cardiac Enzymes No results for input(s): CKMB, TROPONINI, MYOGLOBIN in the last 168 hours.  Invalid input(s): CK ------------------------------------------------------------------------------------------------------------------    Component Value Date/Time   BNP 8.2 09/04/2015 2236     ---------------------------------------------------------------------------------------------------------------  Urinalysis    Component Value Date/Time   COLORURINE YELLOW 04/03/2016 1626   APPEARANCEUR HAZY (A) 04/03/2016 1626   LABSPEC 1.012 04/03/2016 1626   PHURINE 6.0 04/03/2016 1626   GLUCOSEU 150 (A) 04/03/2016 1626   HGBUR NEGATIVE 04/03/2016 1626   BILIRUBINUR NEGATIVE 04/03/2016 1626   KETONESUR NEGATIVE 04/03/2016 1626   PROTEINUR >=300 (A) 04/03/2016 1626   UROBILINOGEN 0.2 09/19/2014 1620   NITRITE NEGATIVE 04/03/2016 1626   LEUKOCYTESUR NEGATIVE 04/03/2016 1626    ----------------------------------------------------------------------------------------------------------------   Imaging Results:    Dg Chest 2 View  Result Date: 08/14/2017 CLINICAL DATA:  Pt complains of mid-left  chest pain that comes and goes, worsening since yesterday. Pt states she is unable to fully lift L arm. Pt hx HTN. Nonsmoker EXAM: CHEST - 2 VIEW COMPARISON:  04/04/2017 FINDINGS: The heart size and mediastinal contours are within normal limits. Both lungs are clear. No pleural effusion or pneumothorax. The visualized skeletal structures are unremarkable. IMPRESSION: No active cardiopulmonary disease. Electronically Signed   By: Amie Portland M.D.   On: 08/14/2017 18:18   Ct Chest W Contrast  Result Date: 08/15/2017 CLINICAL DATA:  Acute onset of difficulty swallowing and epigastric abdominal pain. EXAM: CT CHEST WITH CONTRAST TECHNIQUE: Multidetector CT imaging of the chest was performed during intravenous contrast administration. CONTRAST:  27mL OMNIPAQUE IOHEXOL 300 MG/ML  SOLN COMPARISON:  Chest radiograph performed 08/14/2017, and CTA of the chest performed 07/04/2011 FINDINGS: Cardiovascular: The heart is normal in size. The thoracic aorta is unremarkable. The great vessels are grossly unremarkable. Mediastinum/Nodes: There is diffuse wall thickening along the distal esophagus, which may reflect esophagitis. Visualized mediastinal nodes remain normal in size. No pericardial effusion is identified. The visualized portions of the thyroid gland are unremarkable. No axillary lymphadenopathy is seen. Lungs/Pleura: Minimal scarring is noted at the lower lobes. The lungs are otherwise clear. No focal consolidation, pleural effusion or pneumothorax is seen. No masses are identified. Upper Abdomen: Trace free air is noted adjacent to the liver at the right upper quadrant. The visualized portions of the liver and spleen are otherwise unremarkable. The visualized portions of the pancreas, adrenal glands and right kidney are within normal limits. Musculoskeletal: No acute osseous abnormalities are identified. The visualized musculature is unremarkable in appearance. IMPRESSION: 1. Diffuse wall thickening along the  distal esophagus, which may reflect esophagitis. Would correlate for associated symptoms. 2. Trace free air noted adjacent to  the liver at the right upper quadrant. CT of the abdomen and pelvis is recommended for further evaluation, to assess for bowel perforation. 3. Minimal scarring at the lower lung lobes. Lungs otherwise clear. Critical Value/emergent results were called by telephone at the time of interpretation on 08/15/2017 at 12:55 am to Dr. Corrie Dandy, who verbally acknowledged these results. Electronically Signed   By: Garald Balding M.D.   On: 08/15/2017 00:57   Ct Abdomen Pelvis W Contrast  Result Date: 08/15/2017 CLINICAL DATA:  Acute abdominal pain. Chest pain x2 days. History of peritoneal dialysis. EXAM: CT ABDOMEN AND PELVIS WITH CONTRAST TECHNIQUE: Multidetector CT imaging of the abdomen and pelvis was performed using the standard protocol following bolus administration of intravenous contrast. CONTRAST:  18mL ISOVUE-300 IOPAMIDOL (ISOVUE-300) INJECTION 61% COMPARISON:  Normal heart size without pericardial effusion FINDINGS: Lower chest: Heart size is normal.  Clear lung bases. Hepatobiliary: Trace perihepatic dialysate with small focus of free intraperitoneal air beneath the right diaphragm similar to 06/02/2017 exam and likely related to peritoneal dialysis. No biliary dilatation. Mild fatty infiltration along the falciform. Small subcapsular cyst in left hepatic lobe too small to further characterize measuring 8 mm.Physiologically distended gallbladder. No biliary dilatation. Faint linear density in the gallbladder may represent tiny calculus abutting what may represent some biliary sludge. Pancreas: Normal Spleen: Normal Adrenals/Urinary Tract: Normal bilateral adrenal glands. Renal mass nephrolithiasis nor obstructive uropathy. Urinary bladder is unremarkable. Stomach/Bowel: Stomach is within normal limits. Appendix appears normal. Increased colonic stool burden is noted consistent  constipation with scattered colonic diverticulosis. Trace edema along the left paracolic gutter may be secondary to peritoneal dialysis. Alternatively mild descending colonic diverticulitis might account for this appearance. Vascular/Lymphatic: No significant vascular findings are present. No enlarged abdominal or pelvic lymph nodes. Reproductive: Status post hysterectomy. No adnexal masses. Other: Peritoneal dialysis catheter from left lower quadrant approach is identified with tip coiled in the mid pelvis. Small fat containing umbilical hernia. Supraumbilical fat containing hernia is also noted measuring 2.5 x 5.2 x 3.5 cm. Musculoskeletal: No acute osseous abnormality. IMPRESSION: 1. Small umbilical and supraumbilical fat containing ventral hernias. 2. Tiny focus of free intraperitoneal gas in the right upper quadrant similar to prior CT and likely represents stigmata peritoneal dialysis. Peritoneal dialysis catheter tip is seen coiled in the mid pelvis. 3. Increased colonic stool burden. Scattered colonic diverticulosis is noted. 4. Along the descending colon is trace pericolonic edema along the left paracolic gutter that may simply reflect stigmata of peritoneal dialysis. Left-sided diverticulitis without complicating features is also possibility. 5. Faint curvilinear calcification in the gallbladder may represent a tiny stone abutting biliary sludge. Electronically Signed   By: Ashley Royalty M.D.   On: 08/15/2017 02:17     Assessment & Plan:    Principal Problem:   Dysphagia Active Problems:   DM (diabetes mellitus) ([place])   ESRD (end stage renal disease) ([place])    Dysphagia NPO Pepcid $RemoveBe'20mg'XPuPfqTUk$  iv bid Please call for GI consult this AM Consider r/o myasthenia if negative  ESRD on peritoneal dialysis Cont renavite Cont Rocaltrol Cont Sensipar Please consult nephrology in AM  DM2 fsbs ac and qhs, ISS   Hypertension Cont lisniopril Cont metoprolol $RemoveBeforeD'25mg'qPfaZqfhBMvDXO$  po bid  ANemia Check cbc in  am  Anxiety Cont Effexor Cont Xanax  Gout Cont Allopurinol    DVT Prophylaxis  -  Lovenox - SCDs  AM Labs Ordered, also please review Full Orders  Family Communication: Admission, patients condition and plan of care including tests being  ordered have been discussed with the patient  who indicate understanding and agree with the plan and Code Status.  Code Status FULL CODE  Likely DC to  home  Condition GUARDED   Consults called:  Please call GI in AM  Admission status: observation  Time spent in minutes : 60   Jani Gravel M.D on 08/15/2017 at 5:03 AM  Between 7am to 7pm - Pager - [phone]  . After 7pm go to [email or web address] - password [facility]  [facility] - Office  [phone]

## 2017-08-15 NOTE — Progress Notes (Signed)
Subjective: Patient admitted this morning, see detailed H&P by Dr Maudie Mercury 42 year old female with hypertension, hyperlipidemia, diabetes mellitus with ESRD on peritoneal dialysis, gout, GERD came with dysphagia to solids and liquids for the past few days.  Patient had EGD in the past which showed moderate hiatal hernia, duodenitis, gastritis.  Vitals:   08/15/17 0647 08/15/17 0942  BP: 104/66 91/74  Pulse: 82 80  Resp: 18 20  Temp: 98.5 F (36.9 C) 98.3 F (36.8 C)  SpO2: 95% 100%      A/P Dysphagia End-stage renal disease on peritoneal dialysis  Consult GI for dysphagia Consult nephrology for ESRD on peritoneal dialysis    [facility] Pager- [phone]

## 2017-08-15 NOTE — Progress Notes (Signed)
Admitted via stretcher. No family with patient. Tele applied.

## 2017-08-15 NOTE — ED Provider Notes (Signed)
I assumed care of this patient from Dr. Glory Rosebush and Dr. Maryan Rued at midnight.  Please see their note for further details of Hx, PE.  Briefly patient is a 42 y.o. female who presented with odynophagia.  Etiology likely GI in nature.  Patient has been given multiple medications including IV Pepcid, GI cocktail narcotics and Valium with minimal relief.  There was low suspicion for cardiac etiology.  Patient had a CT of the chest obtained which did not reveal evidence of esophagitis however there was small free air in the peritoneum.  Radiology recommended a CT of the abdomen.   Plan is to follow-up CT scan and reassess.  CT of the abdomen reassuring without acute process.  Free peritoneal air similar to prior and likely secondary to peritoneal catheter.  Abdomen benign.  Attempted to p.o. challenge the patient but she continued to be symptomatic.  Case was discussed with medicine who evaluated the patient and will admit for symptomatic relief and GI consultation.   Fatima Blank, MD 08/15/17 0630

## 2017-08-15 NOTE — Progress Notes (Signed)
[facility] Progress Note   Brief Summary: Asked to see esrd pt for dialysis, she was admitted for dysphagia.  She was just admitted here for new LUA AV graft as backup for PD. She developed an acute LUE steal and had have the graft removed which was done on 08/11/17.  Pt was dc'd on 08/12/17.    Subjective: today pt denies sig hand pain on L , feeling much better and starting to use it more.  No SOB, no CP, no recent PD problems.  BP's have been "low".  Not eating much, having pain when swallowing in the mid-upper chest.  No hx thrush, no pain in mouth.  No fevers or vomiting.   Objective Vitals:   08/15/17 0530 08/15/17 0545 08/15/17 0647 08/15/17 0942  BP: (!) 105/59 (!) 95/55 104/66 91/74  Pulse: 88  82 80  Resp: $Remo'20 14 18 20  'gLiRe$ Temp:   98.5 F (36.9 C) 98.3 F (36.8 C)  TempSrc:   Oral Oral  SpO2: 96%  95% 100%  Weight:   114.9 kg   Height:   '5\' 7"'$  (1.702 m)    Physical Exam General: Pleasant, NAD Heart: RRR Lungs: CTAB Abdomen: Active BS nontender +BS, PD cath clean and dry Extremities: No LE edema Neuro: NF, ox 3   Additional Objective Labs: Basic Metabolic Panel: Recent Labs  Lab 08/10/17 0355 08/11/17 1853 08/14/17 1821  NA 136 131* 137  K 4.1 4.9 4.0  CL 91*  --  89*  CO2 25  --  26  GLUCOSE 102* 84 94  BUN 46*  --  50*  CREATININE 15.94*  --  18.04*  CALCIUM 7.9*  --  7.3*   CBC: Recent Labs  Lab 08/09/17 0632 08/11/17 1853 08/14/17 1821  WBC  --   --  6.6  HGB 11.2* 8.2* 8.3*  HCT 33.0* 24.0* 26.0*  MCV  --   --  83.6  PLT  --   --  222   Medications: . sodium chloride 75 mL/hr at 08/15/17 1016  . famotidine (PEPCID) IV 20 mg (08/15/17 1024)   . allopurinol  100 mg Oral Daily  . aspirin  81 mg Oral Daily  . atorvastatin  40 mg Oral Daily  . calcitRIOL  0.5 mcg Oral Q breakfast  . cinacalcet  60 mg Oral Q breakfast  . insulin aspart  0-9 Units Subcutaneous Q4H  . lisinopril  20 mg Oral Daily  . multivitamin  1 tablet Oral QHS   . senna  2 tablet Oral QHS  . sevelamer carbonate  3,200 mg Oral TID WC  . venlafaxine XR  75 mg Oral Q breakfast    Home meds:  - xanax 1 bid/ percocet prn/ effexor xr 75 qd  - ecasa 81/ allopurinol 100 qd/ atorvastatin 40 qd /pantoprazole 40 bid/ renvela  - lisinopril 20 qd/ metoprolol 25 qd prn bp > 140  Dialysis:5 Exchanges, 2-4 hour pause, dwell 3000 cc , 1.5 hr overnight, usually uses 2.5% , if BP's low 1.5%   Impression/Plan: 1. Dysphagia: new symptom for this patient. W/U per primary/ GI.  2. Recent failed LUA AVG attempt: AVG removed on 8/8 for steal syndrome shortly after being placed.  3. ESRD on CCPD: will resume usual PD in the hospital, 5 exchanges overnight, 3000 cc fills.   4. HTN/Volume: no edema on exam, BP's soft. Use all 1.5%. Hold BP meds    Kelly Splinter MD [employer] pager [phone]  08/15/2017, 1:32 PM

## 2017-08-15 NOTE — Consult Note (Signed)
Referring Provider: Dr. Darrick Meigs Primary Care Physician:  Blair Heys, PA-C Primary Gastroenterologist:  Dr. Paulita Fujita  Reason for Consultation:  Dysphagia  HPI: Patricia [name] is a 42 y.o. female with ESRD on peritoneal dialysis admitted for 2 day history of dysphagia to solid and liquids stating that as soon as she swallows her first bite of food she will have chest pain and feel like the food/liquid is stuck. She will wait and the sensation will slowly resolve and she will try and finish her meal without any consistent recurrence of her dysphagia. Denies odynophagia. Denies heartburn. +N without vomiting. EGD on 06/04/17 by Dr. Paulita Fujita for melena showed gastritis, duodenitis, and a medium-sized hiatal hernia. She is hungry.  Past Medical History:  Diagnosis Date  . Acid reflux   . Anxiety   . Arthritis   . Dysrhythmia    tachycardia  . ESRD (end stage renal disease) ([place])    [facility]- [place]  . Gout   . Headache(784.0)    "related to chemo; sometimes weekly" (09/12/2013)  . High cholesterol   . History of blood transfusion "a couple"   "related to low counts"  . Hypertension   . Iron deficiency anemia    "get epogen shots q month" (02/20/2014)  . 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 Type II diabetes mellitus ([place])    "took me off my RX ~ 04/2013"    Past Surgical History:  Procedure Laterality Date  . ABDOMINAL HYSTERECTOMY  2010   "laparoscopic"  . ANKLE FRACTURE SURGERY Right 1994  . AV FISTULA PLACEMENT Left 04/14/2016   Procedure: ARTERIOVENOUS (AV) FISTULA CREATION;  Surgeon: Angelia Mould, MD;  Location: [facility] OR;  Service: Vascular;  Laterality: Left;  . AV FISTULA PLACEMENT Left 08/09/2017   Procedure: INSERTION OF ARTERIOVENOUS (AV) GORE-TEX GRAFT LEFT ARM;  Surgeon: Elam Dutch, MD;  Location: [place];  Service: Vascular;  Laterality: Left;  . [place] REMOVAL Left 08/11/2017   Procedure: REMOVAL OF  ARTERIOVENOUS GORETEX GRAFT ([place]);  Surgeon: Serafina Mitchell, MD;  Location: [facility] OR;  Service: Vascular;  Laterality: Left;  . CARDIAC CATHETERIZATION  2000's  . ESOPHAGOGASTRODUODENOSCOPY    . ESOPHAGOGASTRODUODENOSCOPY (EGD) WITH PROPOFOL Left 06/04/2017   Procedure: ESOPHAGOGASTRODUODENOSCOPY (EGD) WITH PROPOFOL;  Surgeon: Arta Silence, MD;  Location: [place];  Service: Endoscopy;  Laterality: Left;  . FISTULA SUPERFICIALIZATION Left 04/02/2017   Procedure: FISTULA SUPERFICIALIZATION LEFT ARM;  Surgeon: Serafina Mitchell, MD;  Location: [place];  Service: Vascular;  Laterality: Left;  . FISTULOGRAM Left 04/07/2016   Procedure: FISTULOGRAM;  Surgeon: Waynetta Sandy, MD;  Location: [place];  Service: Vascular;  Laterality: Left;  . FRACTURE SURGERY     right ankle  . INSERTION OF DIALYSIS CATHETER Right 04/07/2016   Procedure: INSERTION OF DIALYSIS CATHETER;  Surgeon: Waynetta Sandy, MD;  Location: [place];  Service: Vascular;  Laterality: Right;  . INSERTION OF DIALYSIS CATHETER Right 04/04/2017   Procedure: INSERTION OF DIALYSIS CATHETER;  Surgeon: Waynetta Sandy, MD;  Location: [place];  Service: Vascular;  Laterality: Right;  . LIGATION OF ARTERIOVENOUS  FISTULA Left 04/14/2016   Procedure: LIGATION OF ARTERIOVENOUS  FISTULA;  Surgeon: Angelia Mould, MD;  Location: [place];  Service: Vascular;  Laterality: Left;  . PATCH ANGIOPLASTY Left 06/19/2016   Procedure: PATCH ANGIOPLASTY LEFT ARTERIOVENOUS FISTULA;  Surgeon: Angelia Mould, MD;  Location: [place];  Service: Vascular;  Laterality: Left;  . REVISON OF ARTERIOVENOUS FISTULA Left 06/19/2016   Procedure: REVISION SUPERFICIALIZATION OF BRACHIOCEPHALIC ARTERIOVENOUS FISTULA;  Surgeon: Angelia Mould, MD;  Location: [place];  Service: Vascular;  Laterality: Left;    Prior to Admission medications   Medication Sig Start Date End Date Taking? Authorizing Provider  allopurinol (ZYLOPRIM) 100 MG tablet  Take 100 mg by mouth daily.   Yes [provider]  ALPRAZolam Duanne Moron) 1 MG tablet Take 1 mg by mouth 2 (two) times daily as needed for anxiety.  03/19/17  Yes [provider]  aspirin 81 MG chewable tablet Chew 1 tablet (81 mg total) by mouth daily. 10/03/13  Yes Eugenie Filler, MD  atorvastatin (LIPITOR) 40 MG tablet Take 40 mg by mouth daily. 03/14/14  Yes [provider]  calcitRIOL (ROCALTROL) 0.5 MCG capsule Take 1 capsule (0.5 mcg total) by mouth daily with breakfast. 06/04/17  Yes Bonnielee Haff, MD  cinacalcet (SENSIPAR) 60 MG tablet Take 60 mg by mouth daily.   Yes [provider]  diphenhydramine-acetaminophen (TYLENOL PM) 25-500 MG TABS tablet Take 2 tablets by mouth at bedtime.   Yes [provider]  gentamicin cream (GARAMYCIN) 0.1 % Apply 1 application topically daily.   Yes [provider]  lisinopril (PRINIVIL,ZESTRIL) 20 MG tablet Take 20 mg by mouth daily.    Yes [provider]  metoprolol tartrate (LOPRESSOR) 25 MG tablet Take 25 mg by mouth daily as needed (for high BP > 140).  04/17/16  Yes [provider]  multivitamin (RENA-VIT) TABS tablet Take 1 tablet by mouth daily.   Yes [provider]  ondansetron (ZOFRAN) 8 MG tablet Take 8 mg by mouth every 8 (eight) hours as needed for nausea or vomiting. 07/20/17  Yes [provider]  oxyCODONE-acetaminophen (PERCOCET/ROXICET) 5-325 MG tablet Take 1-2 tablets by mouth every 6 (six) hours as needed for severe pain. 08/09/17  Yes Fields, Jessy Oto, MD  pantoprazole (PROTONIX) 40 MG tablet Take 1 tablet (40 mg total) by mouth 2 (two) times daily before a meal. Patient taking differently: Take 40 mg by mouth daily.  06/04/17  Yes Bonnielee Haff, MD  promethazine (PHENERGAN) 12.5 MG tablet Take 12.5 mg by mouth every 6 (six) hours as needed for nausea or vomiting. 07/20/17  Yes [provider]  senna (SENOKOT) 8.6 MG TABS tablet Take 2 tablets  (17.2 mg total) by mouth at bedtime. 04/15/16  Yes Nita Sells, MD  sevelamer carbonate (RENVELA) 800 MG tablet Take 1,600-3,200 mg by mouth See admin instructions. Take 3200 mg by mouth 3 times daily with meals and take 1600 mg by mouth with snacks.   Yes [provider]  venlafaxine XR (EFFEXOR-XR) 75 MG 24 hr capsule Take 75 mg by mouth daily with breakfast.   Yes [provider]    Scheduled Meds: . allopurinol  100 mg Oral Daily  . aspirin  81 mg Oral Daily  . atorvastatin  40 mg Oral Daily  . calcitRIOL  0.5 mcg Oral Q breakfast  . cinacalcet  60 mg Oral Q breakfast  . insulin aspart  0-9 Units Subcutaneous Q4H  . iopamidol      . lisinopril  20 mg Oral Daily  . multivitamin  1 tablet Oral QHS  . senna  2 tablet Oral QHS  . sevelamer carbonate  3,200 mg Oral TID WC  . venlafaxine XR  75 mg Oral Q breakfast   Continuous Infusions: . sodium chloride 75 mL/hr at  08/15/17 1016  . famotidine (PEPCID) IV 20 mg (08/15/17 1024)   PRN Meds:.acetaminophen **OR** acetaminophen, diphenhydrAMINE **OR** acetaminophen, ALPRAZolam, oxyCODONE-acetaminophen, sevelamer carbonate  Allergies as of 08/14/2017 - Review Complete 08/14/2017  Allergen Reaction Noted  . Nsaids Other (See Comments) 01/27/2013  . Tolmetin Other (See Comments) 07/20/2015    Family History  Problem Relation Age of Onset  . Hypertension Mother   . Thyroid disease Mother   . Coronary artery disease Father   . Hypertension Father   . Diabetes Father     Social History   Socioeconomic History  . Marital status: Single    Spouse name: Not on file  . Number of children: Not on file  . Years of education: Not on file  . Highest education level: Not on file  Occupational History  . Not on file  Social Needs  . Financial resource strain: Not on file  . Food insecurity:    Worry: Not on file    Inability: Not on file  . Transportation needs:    Medical: Not on file    Non-medical: Not  on file  Tobacco Use  . Smoking status: Never Smoker  . Smokeless tobacco: Never Used  Substance and Sexual Activity  . Alcohol use: No  . Drug use: No  . Sexual activity: Not Currently    Birth control/protection: Surgical  Lifestyle  . Physical activity:    Days per week: Not on file    Minutes per session: Not on file  . Stress: Not on file  Relationships  . Social connections:    Talks on phone: Not on file    Gets together: Not on file    Attends religious service: Not on file    Active member of club or organization: Not on file    Attends meetings of clubs or organizations: Not on file    Relationship status: Not on file  . Intimate partner violence:    Fear of current or ex partner: Not on file    Emotionally abused: Not on file    Physically abused: Not on file    Forced sexual activity: Not on file  Other Topics Concern  . Not on file  Social History Narrative  . Not on file    Review of Systems: All negative except as stated above in HPI.  Physical Exam: Vital signs: Vitals:   08/15/17 0647 08/15/17 0942  BP: 104/66 91/74  Pulse: 82 80  Resp: 18 20  Temp: 98.5 F (36.9 C) 98.3 F (36.8 C)  SpO2: 95% 100%     General:   Lethargic, obese, pleasant and cooperative in NAD Head: normocephalic, atraumatic Eyes: anicteric sclera ENT: oropharynx clear Neck: supple, nontender Lungs:  Clear throughout to auscultation.   No wheezes, crackles, or rhonchi. No acute distress. Heart:  Regular rate and rhythm; no murmurs, clicks, rubs,  or gallops. Abdomen: soft, nontender, nondistended, +BS, obese  Rectal:  Deferred Ext: no edema  GI:  Lab Results: Recent Labs    08/14/17 1821  WBC 6.6  HGB 8.3*  HCT 26.0*  PLT 222   BMET Recent Labs    08/14/17 1821  NA 137  K 4.0  CL 89*  CO2 26  GLUCOSE 94  BUN 50*  CREATININE 18.04*  CALCIUM 7.3*   LFT No results for input(s): PROT, ALBUMIN, AST, ALT, ALKPHOS, BILITOT, BILIDIR, IBILI in the last 72  hours. PT/INR No results for input(s): LABPROT, INR in the last 72 hours.  Studies/Results: Dg Chest 2 View  Result Date: 08/14/2017 CLINICAL DATA:  Pt complains of mid-left chest pain that comes and goes, worsening since yesterday. Pt states she is unable to fully lift L arm. Pt hx HTN. Nonsmoker EXAM: CHEST - 2 VIEW COMPARISON:  04/04/2017 FINDINGS: The heart size and mediastinal contours are within normal limits. Both lungs are clear. No pleural effusion or pneumothorax. The visualized skeletal structures are unremarkable. IMPRESSION: No active cardiopulmonary disease. Electronically Signed   By: Lajean Manes M.D.   On: 08/14/2017 18:18   Ct Chest W Contrast  Result Date: 08/15/2017 CLINICAL DATA:  Acute onset of difficulty swallowing and epigastric abdominal pain. EXAM: CT CHEST WITH CONTRAST TECHNIQUE: Multidetector CT imaging of the chest was performed during intravenous contrast administration. CONTRAST:  7mL OMNIPAQUE IOHEXOL 300 MG/ML  SOLN COMPARISON:  Chest radiograph performed 08/14/2017, and CTA of the chest performed 07/04/2011 FINDINGS: Cardiovascular: The heart is normal in size. The thoracic aorta is unremarkable. The great vessels are grossly unremarkable. Mediastinum/Nodes: There is diffuse wall thickening along the distal esophagus, which may reflect esophagitis. Visualized mediastinal nodes remain normal in size. No pericardial effusion is identified. The visualized portions of the thyroid gland are unremarkable. No axillary lymphadenopathy is seen. Lungs/Pleura: Minimal scarring is noted at the lower lobes. The lungs are otherwise clear. No focal consolidation, pleural effusion or pneumothorax is seen. No masses are identified. Upper Abdomen: Trace free air is noted adjacent to the liver at the right upper quadrant. The visualized portions of the liver and spleen are otherwise unremarkable. The visualized portions of the pancreas, adrenal glands and right kidney are within normal  limits. Musculoskeletal: No acute osseous abnormalities are identified. The visualized musculature is unremarkable in appearance. IMPRESSION: 1. Diffuse wall thickening along the distal esophagus, which may reflect esophagitis. Would correlate for associated symptoms. 2. Trace free air noted adjacent to the liver at the right upper quadrant. CT of the abdomen and pelvis is recommended for further evaluation, to assess for bowel perforation. 3. Minimal scarring at the lower lung lobes. Lungs otherwise clear. Critical Value/emergent results were called by telephone at the time of interpretation on 08/15/2017 at 12:55 am to Dr. Corrie Dandy, who verbally acknowledged these results. Electronically Signed   By: Garald Balding M.D.   On: 08/15/2017 00:57   Ct Abdomen Pelvis W Contrast  Result Date: 08/15/2017 CLINICAL DATA:  Acute abdominal pain. Chest pain x2 days. History of peritoneal dialysis. EXAM: CT ABDOMEN AND PELVIS WITH CONTRAST TECHNIQUE: Multidetector CT imaging of the abdomen and pelvis was performed using the standard protocol following bolus administration of intravenous contrast. CONTRAST:  25mL ISOVUE-300 IOPAMIDOL (ISOVUE-300) INJECTION 61% COMPARISON:  Normal heart size without pericardial effusion FINDINGS: Lower chest: Heart size is normal.  Clear lung bases. Hepatobiliary: Trace perihepatic dialysate with small focus of free intraperitoneal air beneath the right diaphragm similar to 06/02/2017 exam and likely related to peritoneal dialysis. No biliary dilatation. Mild fatty infiltration along the falciform. Small subcapsular cyst in left hepatic lobe too small to further characterize measuring 8 mm.Physiologically distended gallbladder. No biliary dilatation. Faint linear density in the gallbladder may represent tiny calculus abutting what may represent some biliary sludge. Pancreas: Normal Spleen: Normal Adrenals/Urinary Tract: Normal bilateral adrenal glands. Renal mass nephrolithiasis nor  obstructive uropathy. Urinary bladder is unremarkable. Stomach/Bowel: Stomach is within normal limits. Appendix appears normal. Increased colonic stool burden is noted consistent constipation with scattered colonic diverticulosis. Trace edema along the left paracolic gutter may be secondary to  peritoneal dialysis. Alternatively mild descending colonic diverticulitis might account for this appearance. Vascular/Lymphatic: No significant vascular findings are present. No enlarged abdominal or pelvic lymph nodes. Reproductive: Status post hysterectomy. No adnexal masses. Other: Peritoneal dialysis catheter from left lower quadrant approach is identified with tip coiled in the mid pelvis. Small fat containing umbilical hernia. Supraumbilical fat containing hernia is also noted measuring 2.5 x 5.2 x 3.5 cm. Musculoskeletal: No acute osseous abnormality. IMPRESSION: 1. Small umbilical and supraumbilical fat containing ventral hernias. 2. Tiny focus of free intraperitoneal gas in the right upper quadrant similar to prior CT and likely represents stigmata peritoneal dialysis. Peritoneal dialysis catheter tip is seen coiled in the mid pelvis. 3. Increased colonic stool burden. Scattered colonic diverticulosis is noted. 4. Along the descending colon is trace pericolonic edema along the left paracolic gutter that may simply reflect stigmata of peritoneal dialysis. Left-sided diverticulitis without complicating features is also possibility. 5. Faint curvilinear calcification in the gallbladder may represent a tiny stone abutting biliary sludge. Electronically Signed   By: Ashley Royalty M.D.   On: 08/15/2017 02:17    Impression/Plan: Acute onset of dysphagia to solids/liquids that I suspect is due to an esophageal yeast infection (Candida). Her esophagus was negative for obstructive processes two months ago so suspect this recent onset of dysphagia is due to an infectious esophagitis. She is immunocompromised on chronic  inhalers, which will predispose her to a yeast infection. Will give IV Diflucan. Clear liquid diet and if symptoms persist over next few days then may need an updated EGD. She requested food but will start with clear liquid diet for now. Supportive care otherwise. Dr. Penelope Coop to f/u tomorrow.    LOS: 0 days   [place] C.  08/15/2017, 1:24 PM  Questions please call [phone]

## 2017-08-16 DIAGNOSIS — K209 Esophagitis, unspecified: Secondary | ICD-10-CM

## 2017-08-16 LAB — GLUCOSE, CAPILLARY
GLUCOSE-CAPILLARY: 109 mg/dL — AB (ref 70–99)
GLUCOSE-CAPILLARY: 91 mg/dL (ref 70–99)
GLUCOSE-CAPILLARY: 94 mg/dL (ref 70–99)
GLUCOSE-CAPILLARY: 98 mg/dL (ref 70–99)
Glucose-Capillary: 132 mg/dL — ABNORMAL HIGH (ref 70–99)
Glucose-Capillary: 97 mg/dL (ref 70–99)
Glucose-Capillary: 111 mg/dL — ABNORMAL HIGH (ref 70–99)

## 2017-08-16 LAB — RENAL FUNCTION PANEL
Albumin: 2.8 g/dL — ABNORMAL LOW (ref 3.5–5.0)
Anion gap: 20 — ABNORMAL HIGH (ref 5–15)
BUN: 57 mg/dL — ABNORMAL HIGH (ref 6–20)
CO2: 26 mmol/L (ref 22–32)
Calcium: 7.5 mg/dL — ABNORMAL LOW (ref 8.9–10.3)
Chloride: 88 mmol/L — ABNORMAL LOW (ref 98–111)
Creatinine, Ser: 18.31 mg/dL — ABNORMAL HIGH (ref 0.44–1.00)
GFR calc Af Amer: 2 mL/min — ABNORMAL LOW (ref >60)
GFR calc non Af Amer: 2 mL/min — ABNORMAL LOW (ref >60)
Glucose, Bld: 90 mg/dL (ref 70–99)
Phosphorus: 7.7 mg/dL — ABNORMAL HIGH (ref 2.5–4.6)
Potassium: 4.8 mmol/L (ref 3.5–5.1)
Sodium: 134 mmol/L — ABNORMAL LOW (ref 135–145)

## 2017-08-16 LAB — COMPREHENSIVE METABOLIC PANEL
ALT: <5 U/L (ref 0–44)
AST: 22 U/L (ref 15–41)
Albumin: 2.8 g/dL — ABNORMAL LOW (ref 3.5–5.0)
Alkaline Phosphatase: 44 U/L (ref 38–126)
Anion gap: 23 — ABNORMAL HIGH (ref 5–15)
BUN: 55 mg/dL — AB (ref 6–20)
CHLORIDE: 91 mmol/L — AB (ref 98–111)
CO2: 22 mmol/L (ref 22–32)
Calcium: 7.9 mg/dL — ABNORMAL LOW (ref 8.9–10.3)
Creatinine, Ser: 18.32 mg/dL — ABNORMAL HIGH (ref 0.44–1.00)
GFR calc Af Amer: 2 mL/min — ABNORMAL LOW (ref >60)
GFR, EST NON AFRICAN AMERICAN: 2 mL/min — AB (ref >60)
Glucose, Bld: 95 mg/dL (ref 70–99)
POTASSIUM: 5.6 mmol/L — AB (ref 3.5–5.1)
Sodium: 136 mmol/L (ref 135–145)
TOTAL PROTEIN: 6.1 g/dL — AB (ref 6.5–8.1)
Total Bilirubin: 0.5 mg/dL (ref 0.3–1.2)

## 2017-08-16 LAB — IRON AND TIBC
Iron: 138 ug/dL (ref 28–170)
Saturation Ratios: 68 % — ABNORMAL HIGH (ref 10.4–31.8)
TIBC: 203 ug/dL — ABNORMAL LOW (ref 250–450)
UIBC: 65 ug/dL

## 2017-08-16 LAB — CBC
HCT: 25.5 % — ABNORMAL LOW (ref 36.0–46.0)
HEMOGLOBIN: 8.3 g/dL — AB (ref 12.0–15.0)
MCH: 26.7 pg (ref 26.0–34.0)
MCHC: 32.5 g/dL (ref 30.0–36.0)
MCV: 82 fL (ref 78.0–100.0)
PLATELETS: 225 10*3/uL (ref 150–400)
RBC: 3.11 MIL/uL — ABNORMAL LOW (ref 3.87–5.11)
RDW: 15 % (ref 11.5–15.5)
WBC: 6 10*3/uL (ref 4.0–10.5)

## 2017-08-16 MED ORDER — CINACALCET HCL 30 MG PO TABS
30.0000 mg | ORAL_TABLET | Freq: Every day | ORAL | Status: DC
  Administered 2017-08-17 – 2017-08-19 (×3): 30 mg via ORAL
  Filled 2017-08-16 (×3): qty 1

## 2017-08-16 MED ORDER — DARBEPOETIN ALFA 100 MCG/0.5ML IJ SOSY: 100.0000 ug | PREFILLED_SYRINGE | INTRAMUSCULAR | Status: DC

## 2017-08-16 MED ORDER — HEPARIN 1000 UNIT/ML FOR PERITONEAL DIALYSIS: 500.0000 [IU] | INTRAMUSCULAR | Status: DC | PRN

## 2017-08-16 MED ORDER — GENTAMICIN SULFATE 0.1 % EX CREA
1.0000 "application " | TOPICAL_CREAM | Freq: Every day | CUTANEOUS | Status: DC
  Filled 2017-08-16: qty 15

## 2017-08-16 MED ORDER — DARBEPOETIN ALFA 60 MCG/0.3ML IJ SOSY
60.0000 ug | PREFILLED_SYRINGE | INTRAMUSCULAR | Status: DC
  Administered 2017-08-16: 60 ug via SUBCUTANEOUS
  Filled 2017-08-16: qty 0.3

## 2017-08-16 NOTE — Progress Notes (Signed)
[facility]  PROGRESS NOTE  Lasundra Hascall YTK:[id number] DOB: 25-Jan-1975 DOA: 08/14/2017 PCP: Blair Heys, PA-C   Brief HPI:   42 year old female with hypertension, hyperlipidemia, diabetes mellitus with ESRD on peritoneal dialysis, gout, GERD came with dysphagia to solids and liquids for the past few days.  Patient had EGD in the past which showed moderate hiatal hernia, duodenitis, gastritis.    Subjective   Patient seen and examined, seen by GI yesterday.  Started on Diflucan for possible Candida esophagitis.  She is swallowing better today.   Assessment/Plan:     1. Dysphagia-patient earlier had EGD in May 2019 which showed duodenitis and gastritis, along with moderate hiatal hernia.  EKG was consulted and patient started on IV Diflucan.  Patient is swallowing better.  We will advance diet to full liquid. 2. ESRD on peritoneal dialysis-continue Sensipar, Rocaltrol, Rena-Vite.  Nephrology has been consulted. 3. Diabetes mellitus -continue sliding scale insulin with NovoLog.  Blood glucose is well controlled. 4. Hypertension-blood pressure is controlled, continue metoprolol, lisinopril 5. History of gout-continue allopurinol 6. History of anxiety-Effexor,     DVT prophylaxis: SCDs  Code Status: Full code  Family Communication: No family at bedside  Disposition Plan: likely home when medically ready for discharge   Consultants:  GI  Procedures:  None   Antibiotics:   Anti-infectives (From admission, onward)   Start     Dose/Rate Route Frequency Ordered Stop   08/15/17 1400  fluconazole (DIFLUCAN) IVPB 200 mg     200 mg 100 mL/hr over 60 Minutes Intravenous Every 24 hours 08/15/17 1339 08/22/17 1359       Objective   Vitals:   08/16/17 0744 08/16/17 0755 08/16/17 0947 08/16/17 1631  BP: 113/77  136/67 136/72  Pulse: 95  93 78  Resp: $Remo'18  18 18  'wXmZl$ Temp: 98 F (36.7 C)  97.6 F (36.4 C) 98.8 F (37.1 C)  TempSrc: Oral  Oral Oral  SpO2: 97%  97%  96%  Weight:  116.3 kg    Height:        Intake/Output Summary (Last 24 hours) at 08/16/2017 1635 Last data filed at 08/16/2017 1500 Gross per 24 hour  Intake 1745.35 ml  Output 0 ml  Net 1745.35 ml   Filed Weights   08/15/17 0647 08/16/17 0755  Weight: 114.9 kg 116.3 kg     Physical Examination:    General: Appears in no acute distress  Cardiovascular: S1-S2, regular  Respiratory: Clear to auscultation bilaterally, no wheezing or crackles.  Abdomen: Soft, positive epigastric tenderness to palpation  Extremities: No edema of the lower extremities noted  Neurologic: Alert, oriented x3, no focal deficit.     Data Reviewed: I have personally reviewed following labs and imaging studies  CBG: Recent Labs  Lab 08/15/17 2132 08/16/17 0052 08/16/17 0513 08/16/17 0735 08/16/17 1136  GLUCAP 106* 98 91 132* 94    CBC: Recent Labs  Lab 08/11/17 1853 08/14/17 1821 08/16/17 0452  WBC  --  6.6 6.0  HGB 8.2* 8.3* 8.3*  HCT 24.0* 26.0* 25.5*  MCV  --  83.6 82.0  PLT  --  222 751    Basic Metabolic Panel: Recent Labs  Lab 08/10/17 0355 08/11/17 1853 08/14/17 1821 08/16/17 0452 08/16/17 1200  NA 136 131* 137 136 134*  K 4.1 4.9 4.0 5.6* 4.8  CL 91*  --  89* 91* 88*  CO2 25  --  $R'26 22 26  'QG$ GLUCOSE 102* 84 94 95 90  BUN 46*  --  50* 55* 57*  CREATININE 15.94*  --  18.04* 18.32* 18.31*  CALCIUM 7.9*  --  7.3* 7.9* 7.5*  PHOS  --   --   --   --  7.7*    Recent Results (from the past 240 hour(s))  MRSA PCR Screening     Status: None   Collection Time: 08/11/17 12:31 PM  Result Value Ref Range Status   MRSA by PCR NEGATIVE NEGATIVE Final    Comment:        The GeneXpert MRSA Assay (FDA approved for NASAL specimens only), is one component of a comprehensive MRSA colonization surveillance program. It is not intended to diagnose MRSA infection nor to guide or monitor treatment for MRSA infections. Performed at [facility], [address],  [place], [place] [address]      Liver Function Tests: Recent Labs  Lab 08/16/17 0452 08/16/17 1200  AST 22  --   ALT <5  --   ALKPHOS 44  --   BILITOT 0.5  --   PROT 6.1*  --   ALBUMIN 2.8* 2.8*   No results for input(s): LIPASE, AMYLASE in the last 168 hours. No results for input(s): AMMONIA in the last 168 hours.  Cardiac Enzymes: No results for input(s): CKTOTAL, CKMB, CKMBINDEX, TROPONINI in the last 168 hours. BNP (last 3 results) No results for input(s): BNP in the last 8760 hours.  ProBNP (last 3 results) No results for input(s): PROBNP in the last 8760 hours.    Studies: Dg Chest 2 View  Result Date: 08/14/2017 CLINICAL DATA:  Pt complains of mid-left chest pain that comes and goes, worsening since yesterday. Pt states she is unable to fully lift L arm. Pt hx HTN. Nonsmoker EXAM: CHEST - 2 VIEW COMPARISON:  04/04/2017 FINDINGS: The heart size and mediastinal contours are within normal limits. Both lungs are clear. No pleural effusion or pneumothorax. The visualized skeletal structures are unremarkable. IMPRESSION: No active cardiopulmonary disease. Electronically Signed   By: Amie Portland M.D.   On: 08/14/2017 18:18   Ct Chest W Contrast  Result Date: 08/15/2017 CLINICAL DATA:  Acute onset of difficulty swallowing and epigastric abdominal pain. EXAM: CT CHEST WITH CONTRAST TECHNIQUE: Multidetector CT imaging of the chest was performed during intravenous contrast administration. CONTRAST:  39mL OMNIPAQUE IOHEXOL 300 MG/ML  SOLN COMPARISON:  Chest radiograph performed 08/14/2017, and CTA of the chest performed 07/04/2011 FINDINGS: Cardiovascular: The heart is normal in size. The thoracic aorta is unremarkable. The great vessels are grossly unremarkable. Mediastinum/Nodes: There is diffuse wall thickening along the distal esophagus, which may reflect esophagitis. Visualized mediastinal nodes remain normal in size. No pericardial effusion is identified. The visualized portions of  the thyroid gland are unremarkable. No axillary lymphadenopathy is seen. Lungs/Pleura: Minimal scarring is noted at the lower lobes. The lungs are otherwise clear. No focal consolidation, pleural effusion or pneumothorax is seen. No masses are identified. Upper Abdomen: Trace free air is noted adjacent to the liver at the right upper quadrant. The visualized portions of the liver and spleen are otherwise unremarkable. The visualized portions of the pancreas, adrenal glands and right kidney are within normal limits. Musculoskeletal: No acute osseous abnormalities are identified. The visualized musculature is unremarkable in appearance. IMPRESSION: 1. Diffuse wall thickening along the distal esophagus, which may reflect esophagitis. Would correlate for associated symptoms. 2. Trace free air noted adjacent to the liver at the right upper quadrant. CT of the abdomen and pelvis is recommended for further evaluation,  to assess for bowel perforation. 3. Minimal scarring at the lower lung lobes. Lungs otherwise clear. Critical Value/emergent results were called by telephone at the time of interpretation on 08/15/2017 at 12:55 am to Dr. Corrie Dandy, who verbally acknowledged these results. Electronically Signed   By: Garald Balding M.D.   On: 08/15/2017 00:57   Ct Abdomen Pelvis W Contrast  Result Date: 08/15/2017 CLINICAL DATA:  Acute abdominal pain. Chest pain x2 days. History of peritoneal dialysis. EXAM: CT ABDOMEN AND PELVIS WITH CONTRAST TECHNIQUE: Multidetector CT imaging of the abdomen and pelvis was performed using the standard protocol following bolus administration of intravenous contrast. CONTRAST:  57mL ISOVUE-300 IOPAMIDOL (ISOVUE-300) INJECTION 61% COMPARISON:  Normal heart size without pericardial effusion FINDINGS: Lower chest: Heart size is normal.  Clear lung bases. Hepatobiliary: Trace perihepatic dialysate with small focus of free intraperitoneal air beneath the right diaphragm similar to 06/02/2017  exam and likely related to peritoneal dialysis. No biliary dilatation. Mild fatty infiltration along the falciform. Small subcapsular cyst in left hepatic lobe too small to further characterize measuring 8 mm.Physiologically distended gallbladder. No biliary dilatation. Faint linear density in the gallbladder may represent tiny calculus abutting what may represent some biliary sludge. Pancreas: Normal Spleen: Normal Adrenals/Urinary Tract: Normal bilateral adrenal glands. Renal mass nephrolithiasis nor obstructive uropathy. Urinary bladder is unremarkable. Stomach/Bowel: Stomach is within normal limits. Appendix appears normal. Increased colonic stool burden is noted consistent constipation with scattered colonic diverticulosis. Trace edema along the left paracolic gutter may be secondary to peritoneal dialysis. Alternatively mild descending colonic diverticulitis might account for this appearance. Vascular/Lymphatic: No significant vascular findings are present. No enlarged abdominal or pelvic lymph nodes. Reproductive: Status post hysterectomy. No adnexal masses. Other: Peritoneal dialysis catheter from left lower quadrant approach is identified with tip coiled in the mid pelvis. Small fat containing umbilical hernia. Supraumbilical fat containing hernia is also noted measuring 2.5 x 5.2 x 3.5 cm. Musculoskeletal: No acute osseous abnormality. IMPRESSION: 1. Small umbilical and supraumbilical fat containing ventral hernias. 2. Tiny focus of free intraperitoneal gas in the right upper quadrant similar to prior CT and likely represents stigmata peritoneal dialysis. Peritoneal dialysis catheter tip is seen coiled in the mid pelvis. 3. Increased colonic stool burden. Scattered colonic diverticulosis is noted. 4. Along the descending colon is trace pericolonic edema along the left paracolic gutter that may simply reflect stigmata of peritoneal dialysis. Left-sided diverticulitis without complicating features is also  possibility. 5. Faint curvilinear calcification in the gallbladder may represent a tiny stone abutting biliary sludge. Electronically Signed   By: Ashley Royalty M.D.   On: 08/15/2017 02:17    Scheduled Meds: . allopurinol  100 mg Oral Daily  . aspirin  81 mg Oral Daily  . atorvastatin  40 mg Oral Daily  . calcitRIOL  0.5 mcg Oral Q breakfast  . [START ON 08/17/2017] cinacalcet  30 mg Oral Q breakfast  . darbepoetin (ARANESP) injection - NON-DIALYSIS  60 mcg Subcutaneous Q Mon-1800  . gentamicin cream  1 application Topical Daily  . insulin aspart  0-9 Units Subcutaneous Q4H  . lisinopril  20 mg Oral Daily  . multivitamin  1 tablet Oral QHS  . senna  2 tablet Oral QHS  . sevelamer carbonate  3,200 mg Oral TID WC  . venlafaxine XR  75 mg Oral Q breakfast      Time spent: 25 min  [facility] Pager [phone]. If 7PM-7AM, please contact night-coverage at [email or web address], Office  [place]  08/16/2017, 4:35 PM  LOS: 1 day

## 2017-08-16 NOTE — Progress Notes (Signed)
She thinks that her swallowing is better today.  Probably has candida esophagitis. Continue the Diflucan

## 2017-08-16 NOTE — Progress Notes (Signed)
Pt. Requested 2.5% dextrose for PD when bp stable. Pt. bp 148/72. Dr. Carolin Sicks notified. Continue with 2.5% dextrose. Pt. stable

## 2017-08-16 NOTE — Progress Notes (Addendum)
[facility] Progress Note   Subjective: Talking on phone. Says she feels better, would like to try to eat.   Objective Vitals:   08/16/17 0735 08/16/17 0744 08/16/17 0755 08/16/17 0947  BP:  113/77  136/67  Pulse:  95  93  Resp:    18  Temp:  98 F (36.7 C)  97.6 F (36.4 C)  TempSrc: Oral   Oral  SpO2:  97%  97%  Weight:   116.3 kg   Height:       Physical Exam General: Obese female in NAD Heart: S1,S2, RRR Lungs: CTAB A/P Abdomen: Active BS Extremities: No LE edema.  Dialysis Access: PD cath Drsg CDI.   Additional Objective Labs: Basic Metabolic Panel: Recent Labs  Lab 08/10/17 0355 08/11/17 1853 08/14/17 1821 08/16/17 0452  NA 136 131* 137 136  K 4.1 4.9 4.0 5.6*  CL 91*  --  89* 91*  CO2 25  --  26 22  GLUCOSE 102* 84 94 95  BUN 46*  --  50* 55*  CREATININE 15.94*  --  18.04* 18.32*  CALCIUM 7.9*  --  7.3* 7.9*   Liver Function Tests: Recent Labs  Lab 08/16/17 0452  AST 22  ALT <5  ALKPHOS 44  BILITOT 0.5  PROT 6.1*  ALBUMIN 2.8*   No results for input(s): LIPASE, AMYLASE in the last 168 hours. CBC: Recent Labs  Lab 08/11/17 1853 08/14/17 1821 08/16/17 0452  WBC  --  6.6 6.0  HGB 8.2* 8.3* 8.3*  HCT 24.0* 26.0* 25.5*  MCV  --  83.6 82.0  PLT  --  222 225   Blood Culture    Component Value Date/Time   SDES PERITONEAL DIALYSATE 06/03/2017 0240   SPECREQUEST NONE 06/03/2017 0240   CULT  06/03/2017 0240    NO GROWTH 3 DAYS Performed at [facility], [place] [address] [place], [place] [address]    REPTSTATUS 06/06/2017 FINAL 06/03/2017 0240    Cardiac Enzymes: No results for input(s): CKTOTAL, CKMB, CKMBINDEX, TROPONINI in the last 168 hours. CBG: Recent Labs  Lab 08/15/17 1648 08/15/17 2132 08/16/17 0052 08/16/17 0513 08/16/17 0735  GLUCAP 73 106* 98 91 132*   Iron Studies: No results for input(s): IRON, TIBC, TRANSFERRIN, FERRITIN in the last 72 hours. $RemoveB'@lablastinr3'VJAUJrMP$ @ Studies/Results: Dg Chest 2  View  Result Date: 08/14/2017 CLINICAL DATA:  Pt complains of mid-left chest pain that comes and goes, worsening since yesterday. Pt states she is unable to fully lift L arm. Pt hx HTN. Nonsmoker EXAM: CHEST - 2 VIEW COMPARISON:  04/04/2017 FINDINGS: The heart size and mediastinal contours are within normal limits. Both lungs are clear. No pleural effusion or pneumothorax. The visualized skeletal structures are unremarkable. IMPRESSION: No active cardiopulmonary disease. Electronically Signed   By: Lajean Manes M.D.   On: 08/14/2017 18:18   Ct Chest W Contrast  Result Date: 08/15/2017 CLINICAL DATA:  Acute onset of difficulty swallowing and epigastric abdominal pain. EXAM: CT CHEST WITH CONTRAST TECHNIQUE: Multidetector CT imaging of the chest was performed during intravenous contrast administration. CONTRAST:  29mL OMNIPAQUE IOHEXOL 300 MG/ML  SOLN COMPARISON:  Chest radiograph performed 08/14/2017, and CTA of the chest performed 07/04/2011 FINDINGS: Cardiovascular: The heart is normal in size. The thoracic aorta is unremarkable. The great vessels are grossly unremarkable. Mediastinum/Nodes: There is diffuse wall thickening along the distal esophagus, which may reflect esophagitis. Visualized mediastinal nodes remain normal in size. No pericardial effusion is identified. The visualized portions of  the thyroid gland are unremarkable. No axillary lymphadenopathy is seen. Lungs/Pleura: Minimal scarring is noted at the lower lobes. The lungs are otherwise clear. No focal consolidation, pleural effusion or pneumothorax is seen. No masses are identified. Upper Abdomen: Trace free air is noted adjacent to the liver at the right upper quadrant. The visualized portions of the liver and spleen are otherwise unremarkable. The visualized portions of the pancreas, adrenal glands and right kidney are within normal limits. Musculoskeletal: No acute osseous abnormalities are identified. The visualized musculature is  unremarkable in appearance. IMPRESSION: 1. Diffuse wall thickening along the distal esophagus, which may reflect esophagitis. Would correlate for associated symptoms. 2. Trace free air noted adjacent to the liver at the right upper quadrant. CT of the abdomen and pelvis is recommended for further evaluation, to assess for bowel perforation. 3. Minimal scarring at the lower lung lobes. Lungs otherwise clear. Critical Value/emergent results were called by telephone at the time of interpretation on 08/15/2017 at 12:55 am to Dr. Corrie Dandy, who verbally acknowledged these results. Electronically Signed   By: Garald Balding M.D.   On: 08/15/2017 00:57   Ct Abdomen Pelvis W Contrast  Result Date: 08/15/2017 CLINICAL DATA:  Acute abdominal pain. Chest pain x2 days. History of peritoneal dialysis. EXAM: CT ABDOMEN AND PELVIS WITH CONTRAST TECHNIQUE: Multidetector CT imaging of the abdomen and pelvis was performed using the standard protocol following bolus administration of intravenous contrast. CONTRAST:  27mL ISOVUE-300 IOPAMIDOL (ISOVUE-300) INJECTION 61% COMPARISON:  Normal heart size without pericardial effusion FINDINGS: Lower chest: Heart size is normal.  Clear lung bases. Hepatobiliary: Trace perihepatic dialysate with small focus of free intraperitoneal air beneath the right diaphragm similar to 06/02/2017 exam and likely related to peritoneal dialysis. No biliary dilatation. Mild fatty infiltration along the falciform. Small subcapsular cyst in left hepatic lobe too small to further characterize measuring 8 mm.Physiologically distended gallbladder. No biliary dilatation. Faint linear density in the gallbladder may represent tiny calculus abutting what may represent some biliary sludge. Pancreas: Normal Spleen: Normal Adrenals/Urinary Tract: Normal bilateral adrenal glands. Renal mass nephrolithiasis nor obstructive uropathy. Urinary bladder is unremarkable. Stomach/Bowel: Stomach is within normal limits.  Appendix appears normal. Increased colonic stool burden is noted consistent constipation with scattered colonic diverticulosis. Trace edema along the left paracolic gutter may be secondary to peritoneal dialysis. Alternatively mild descending colonic diverticulitis might account for this appearance. Vascular/Lymphatic: No significant vascular findings are present. No enlarged abdominal or pelvic lymph nodes. Reproductive: Status post hysterectomy. No adnexal masses. Other: Peritoneal dialysis catheter from left lower quadrant approach is identified with tip coiled in the mid pelvis. Small fat containing umbilical hernia. Supraumbilical fat containing hernia is also noted measuring 2.5 x 5.2 x 3.5 cm. Musculoskeletal: No acute osseous abnormality. IMPRESSION: 1. Small umbilical and supraumbilical fat containing ventral hernias. 2. Tiny focus of free intraperitoneal gas in the right upper quadrant similar to prior CT and likely represents stigmata peritoneal dialysis. Peritoneal dialysis catheter tip is seen coiled in the mid pelvis. 3. Increased colonic stool burden. Scattered colonic diverticulosis is noted. 4. Along the descending colon is trace pericolonic edema along the left paracolic gutter that may simply reflect stigmata of peritoneal dialysis. Left-sided diverticulitis without complicating features is also possibility. 5. Faint curvilinear calcification in the gallbladder may represent a tiny stone abutting biliary sludge. Electronically Signed   By: Ashley Royalty M.D.   On: 08/15/2017 02:17   Medications: . famotidine (PEPCID) IV 20 mg (08/16/17 1027)  . fluconazole (DIFLUCAN) IV  Stopped (08/15/17 2229)   . allopurinol  100 mg Oral Daily  . aspirin  81 mg Oral Daily  . atorvastatin  40 mg Oral Daily  . calcitRIOL  0.5 mcg Oral Q breakfast  . cinacalcet  60 mg Oral Q breakfast  . gentamicin cream  1 application Topical Daily  . insulin aspart  0-9 Units Subcutaneous Q4H  . lisinopril  20 mg Oral  Daily  . multivitamin  1 tablet Oral QHS  . senna  2 tablet Oral QHS  . sevelamer carbonate  3,200 mg Oral TID WC  . venlafaxine XR  75 mg Oral Q breakfast   Dialysis: 5 exchanges, 2-4 hour pause, dwell 3000 cc, 1.5 hr overnight, usually 2.5%, if hypotensive, 1.5%.   Assessment/Plan: 1. Dyphagia-seen by GI. Thought to have esophageal yeast D/T immunosuppression but patient has not been on prednisone. Started on IV Diflucan. Per primary/GI.  2. 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 F/U with [facility] on discharge for plan for permanent access.  3. ESRD -on CCPD.  3. Anemia - HGB 8.3. Give Aranesp 60 mcg SQ today. Check iron panel.  4. Secondary hyperparathyroidism -Ca 7.9 C Ca 8.8 decrease sensipar to 30 mg PO daily. Resume binders when eating. Cont VDRA. Renal function panel added to today's labs.  5. HTN/volume -BP still down, not eating but not losing wt. No edema on exam. Continue 1.5% solution. Scr 18.31 not getting good clearance. Will need to adjust orders.  6. Nutrition -Albumin 2.8. Clear liquid diet. Advance when appropriate. Add prostat, renal vits.   [name] NP-C 08/16/2017, 10:36 AM  [facility] [phone]  Renal Attending: She is under dialyzed with elevated creatinine.  She has not yet had a PET test.  We will increase her dwell time and andd another exchange.  I tried to call her at 1:30 to explain it to her but there was no answer. Erling Cruz, MD

## 2017-08-17 LAB — GLUCOSE, CAPILLARY
GLUCOSE-CAPILLARY: 89 mg/dL (ref 70–99)
GLUCOSE-CAPILLARY: 83 mg/dL (ref 70–99)
Glucose-Capillary: 116 mg/dL — ABNORMAL HIGH (ref 70–99)
Glucose-Capillary: 109 mg/dL — ABNORMAL HIGH (ref 70–99)
Glucose-Capillary: 115 mg/dL — ABNORMAL HIGH (ref 70–99)

## 2017-08-17 LAB — BASIC METABOLIC PANEL
ANION GAP: 21 — AB (ref 5–15)
BUN: 51 mg/dL — ABNORMAL HIGH (ref 6–20)
CO2: 26 mmol/L (ref 22–32)
Calcium: 7.8 mg/dL — ABNORMAL LOW (ref 8.9–10.3)
Chloride: 90 mmol/L — ABNORMAL LOW (ref 98–111)
Creatinine, Ser: 18.05 mg/dL — ABNORMAL HIGH (ref 0.44–1.00)
GFR calc Af Amer: 2 mL/min — ABNORMAL LOW (ref >60)
GFR, EST NON AFRICAN AMERICAN: 2 mL/min — AB (ref >60)
GLUCOSE: 109 mg/dL — AB (ref 70–99)
POTASSIUM: 4.5 mmol/L (ref 3.5–5.1)
Sodium: 137 mmol/L (ref 135–145)

## 2017-08-17 LAB — CBC
HEMATOCRIT: 25.5 % — AB (ref 36.0–46.0)
Hemoglobin: 8.2 g/dL — ABNORMAL LOW (ref 12.0–15.0)
MCH: 26.9 pg (ref 26.0–34.0)
MCHC: 32.2 g/dL (ref 30.0–36.0)
MCV: 83.6 fL (ref 78.0–100.0)
PLATELETS: 251 10*3/uL (ref 150–400)
RBC: 3.05 MIL/uL — AB (ref 3.87–5.11)
RDW: 14.9 % (ref 11.5–15.5)
WBC: 5.5 10*3/uL (ref 4.0–10.5)

## 2017-08-17 MED ORDER — GENTAMICIN SULFATE 0.1 % EX CREA: 1.0000 "application " | TOPICAL_CREAM | Freq: Every day | CUTANEOUS | Status: DC

## 2017-08-17 MED ORDER — ORAL CARE MOUTH RINSE
15.0000 mL | Freq: Two times a day (BID) | OROMUCOSAL | Status: DC
  Administered 2017-08-17 – 2017-08-19 (×3): 15 mL via OROMUCOSAL

## 2017-08-17 MED ORDER — HEPARIN 1000 UNIT/ML FOR PERITONEAL DIALYSIS: 500.0000 [IU] | INTRAMUSCULAR | Status: DC | PRN

## 2017-08-17 NOTE — Progress Notes (Signed)
[facility]  PROGRESS NOTE  Patricia [name] YNW:[id number] DOB: 1975-10-19 DOA: 08/14/2017 PCP: Blair Heys, PA-C   Brief HPI:   42 year old female with hypertension, hyperlipidemia, diabetes mellitus with ESRD on peritoneal dialysis, gout, GERD came with dysphagia to solids and liquids for the past few days.  Patient had EGD in the past which showed moderate hiatal hernia, duodenitis, gastritis.    Subjective   Patient seen and examined, swallowing has improved after starting on IV Diflucan.   Assessment/Plan:     1. Dysphagia likely from Candida esophagitis-patient earlier had EGD in May 2019 which showed duodenitis and gastritis, along with moderate hiatal hernia.  EKG was consulted and patient started on IV Diflucan.  Patient is swallowing better.  Diet has been advanced to soft diet. 2. ESRD on peritoneal dialysis-continue Sensipar, Rocaltrol, Rena-Vite.  Nephrology has been consulted. 3. Diabetes mellitus -continue sliding scale insulin with NovoLog.  Blood glucose is well controlled. 4. Hypertension-blood pressure is controlled, continue metoprolol, lisinopril 5. History of gout-continue allopurinol 6. History of anxiety-Effexor 7. Anemia of chronic disease-secondary to renal failure, hemoglobin stable at 8.2    DVT prophylaxis: SCDs  Code Status: Full code  Family Communication: No family at bedside  Disposition Plan: likely home when medically ready for discharge   Consultants:  GI  Procedures:  None   Antibiotics:   Anti-infectives (From admission, onward)   Start     Dose/Rate Route Frequency Ordered Stop   08/15/17 1400  fluconazole (DIFLUCAN) IVPB 200 mg     200 mg 100 mL/hr over 60 Minutes Intravenous Every 24 hours 08/15/17 1339 08/22/17 1359       Objective   Vitals:   08/17/17 0426 08/17/17 0427 08/17/17 0806 08/17/17 1105  BP: 128/79 128/79 129/87 117/79  Pulse: 90 80 82 85  Resp: $Remo'18 18  18  'PuCCH$ Temp:  98 F (36.7 C) 98.6 F (37  C) 97.6 F (36.4 C)  TempSrc:  Oral Oral Oral  SpO2: 94% 100% 98% 97%  Weight:      Height:        Intake/Output Summary (Last 24 hours) at 08/17/2017 1418 Last data filed at 08/17/2017 0600 Gross per 24 hour  Intake 278 ml  Output 0 ml  Net 278 ml   Filed Weights   08/15/17 0647 08/16/17 0755 08/16/17 1818  Weight: 114.9 kg 116.3 kg 116.3 kg     Physical Examination:   Mouth: Oral mucosa is moist, no lesions on palate,  Neck: Supple, no deformities, masses, or tenderness Lungs: Normal respiratory effort, bilateral clear to auscultation, no crackles or wheezes.  Heart: Regular rate and rhythm, S1 and S2 normal, no murmurs, rubs auscultated Abdomen: BS normoactive,soft,nondistended,non-tender to palpation,no organomegaly Extremities: No pretibial edema, no erythema, no cyanosis, no clubbing Neuro : Alert and oriented to time, place and person, No focal deficits     Data Reviewed: I have personally reviewed following labs and imaging studies  CBG: Recent Labs  Lab 08/16/17 2025 08/16/17 2358 08/17/17 0424 08/17/17 0807 08/17/17 1216  GLUCAP 111* 109* 116* 109* 89    CBC: Recent Labs  Lab 08/11/17 1853 08/14/17 1821 08/16/17 0452 08/17/17 0514  WBC  --  6.6 6.0 5.5  HGB 8.2* 8.3* 8.3* 8.2*  HCT 24.0* 26.0* 25.5* 25.5*  MCV  --  83.6 82.0 83.6  PLT  --  222 225 657    Basic Metabolic Panel: Recent Labs  Lab 08/11/17 1853 08/14/17 1821 08/16/17 0452 08/16/17 1200 08/17/17 0514  NA  131* 137 136 134* 137  K 4.9 4.0 5.6* 4.8 4.5  CL  --  89* 91* 88* 90*  CO2  --  $R'26 22 26 26  'eF$ GLUCOSE 84 94 95 90 109*  BUN  --  50* 55* 57* 51*  CREATININE  --  18.04* 18.32* 18.31* 18.05*  CALCIUM  --  7.3* 7.9* 7.5* 7.8*  PHOS  --   --   --  7.7*  --     Recent Results (from the past 240 hour(s))  MRSA PCR Screening     Status: None   Collection Time: 08/11/17 12:31 PM  Result Value Ref Range Status   MRSA by PCR NEGATIVE NEGATIVE Final    Comment:         The GeneXpert MRSA Assay (FDA approved for NASAL specimens only), is one component of a comprehensive MRSA colonization surveillance program. It is not intended to diagnose MRSA infection nor to guide or monitor treatment for MRSA infections. Performed at [facility], [place] [address] [place], [place] [address]      Liver Function Tests: Recent Labs  Lab 08/16/17 0452 08/16/17 1200  AST 22  --   ALT <5  --   ALKPHOS 44  --   BILITOT 0.5  --   PROT 6.1*  --   ALBUMIN 2.8* 2.8*   No results for input(s): LIPASE, AMYLASE in the last 168 hours. No results for input(s): AMMONIA in the last 168 hours.  Cardiac Enzymes: No results for input(s): CKTOTAL, CKMB, CKMBINDEX, TROPONINI in the last 168 hours. BNP (last 3 results) No results for input(s): BNP in the last 8760 hours.  ProBNP (last 3 results) No results for input(s): PROBNP in the last 8760 hours.    Studies: No results found.  Scheduled Meds: . allopurinol  100 mg Oral Daily  . aspirin  81 mg Oral Daily  . atorvastatin  40 mg Oral Daily  . calcitRIOL  0.5 mcg Oral Q breakfast  . cinacalcet  30 mg Oral Q breakfast  . darbepoetin (ARANESP) injection - NON-DIALYSIS  60 mcg Subcutaneous Q Mon-1800  . gentamicin cream  1 application Topical Daily  . insulin aspart  0-9 Units Subcutaneous Q4H  . lisinopril  20 mg Oral Daily  . multivitamin  1 tablet Oral QHS  . senna  2 tablet Oral QHS  . sevelamer carbonate  3,200 mg Oral TID WC  . venlafaxine XR  75 mg Oral Q breakfast      Time spent: 25 min  [facility] Pager [phone]. If 7PM-7AM, please contact night-coverage at [email or web address], Office  [phone]  password [email or web address]  08/17/2017, 2:18 PM  LOS: 2 days

## 2017-08-17 NOTE — Progress Notes (Addendum)
[facility] Progress Note   Subjective: Awake, alert, no C/Os. Says she believes last bag is not draining completely-wants to try to do stat drain at end of final exchange and see if this helps. Called HD center, last Kt/V 1.84 Scr-15-18.   Objective Vitals:   08/16/17 1631 08/16/17 1818 08/16/17 2025 08/17/17 0427  BP: 136/72 (!) 142/78 (!) 120/101 128/79  Pulse: 78 87 80 80  Resp: $Remo'18 18 16 18  'vVesQ$ Temp: 98.8 F (37.1 C) 98.5 F (36.9 C) 98.6 F (37 C) 98 F (36.7 C)  TempSrc: Oral Oral Oral Oral  SpO2: 96% 98% 100% 100%  Weight:  116.3 kg    Height:       Physical Exam General: Pleasant, obese female in NAD Heart: RRR Lungs: CTAB Abdomen: S,NT, LUQ PD cath drsg CDI.  Extremities: No LE edema Dialysis Access: LUQ PD cath. Pt reports no erythema, drainage around exit site.   Additional Objective Labs: Basic Metabolic Panel: Recent Labs  Lab 08/16/17 0452 08/16/17 1200 08/17/17 0514  NA 136 134* 137  K 5.6* 4.8 4.5  CL 91* 88* 90*  CO2 $Re'22 26 26  'kjc$ GLUCOSE 95 90 109*  BUN 55* 57* 51*  CREATININE 18.32* 18.31* 18.05*  CALCIUM 7.9* 7.5* 7.8*  PHOS  --  7.7*  --    Liver Function Tests: Recent Labs  Lab 08/16/17 0452 08/16/17 1200  AST 22  --   ALT <5  --   ALKPHOS 44  --   BILITOT 0.5  --   PROT 6.1*  --   ALBUMIN 2.8* 2.8*   No results for input(s): LIPASE, AMYLASE in the last 168 hours. CBC: Recent Labs  Lab 08/14/17 1821 08/16/17 0452 08/17/17 0514  WBC 6.6 6.0 5.5  HGB 8.3* 8.3* 8.2*  HCT 26.0* 25.5* 25.5*  MCV 83.6 82.0 83.6  PLT 222 225 251   Blood Culture    Component Value Date/Time   SDES PERITONEAL DIALYSATE 06/03/2017 0240   SPECREQUEST NONE 06/03/2017 0240   CULT  06/03/2017 0240    NO GROWTH 3 DAYS Performed at [facility], [place] [address], [place], [place] [address]    REPTSTATUS 06/06/2017 FINAL 06/03/2017 0240    Cardiac Enzymes: No results for input(s): CKTOTAL, CKMB, CKMBINDEX, TROPONINI in the last  168 hours. CBG: Recent Labs  Lab 08/16/17 1634 08/16/17 2025 08/16/17 2358 08/17/17 0424 08/17/17 0807  GLUCAP 97 111* 109* 116* 109*   Iron Studies:  Recent Labs    08/16/17 1200  IRON 138  TIBC 203*   '@lablastinr3'$ @ Studies/Results: No results found. Medications: . famotidine (PEPCID) IV 20 mg (08/16/17 1027)  . fluconazole (DIFLUCAN) IV 200 mg (08/16/17 1340)   . allopurinol  100 mg Oral Daily  . aspirin  81 mg Oral Daily  . atorvastatin  40 mg Oral Daily  . calcitRIOL  0.5 mcg Oral Q breakfast  . cinacalcet  30 mg Oral Q breakfast  . darbepoetin (ARANESP) injection - NON-DIALYSIS  60 mcg Subcutaneous Q Mon-1800  . gentamicin cream  1 application Topical Daily  . insulin aspart  0-9 Units Subcutaneous Q4H  . lisinopril  20 mg Oral Daily  . multivitamin  1 tablet Oral QHS  . senna  2 tablet Oral QHS  . sevelamer carbonate  3,200 mg Oral TID WC  . venlafaxine XR  75 mg Oral Q breakfast   CCPD Orders: 7X Week, EDW 108.5 (kg) Ca 2.5 (mEq/L) Mg 0.5 (mEq/L) Dextrose 1.5; 2.5;  4.25 %, # Exchanges 5, fill vol 3000 mL, Dwell Time 1 hrs 30 min, last fill vol 3000 mL, Day Exchanges 1, daytime fill vol 3000 mL, Day Dwell Time 4 hrs   Assessment/Plan: 1. Dyphagia-seen by GI. Thought to have esophageal yeast D/T immunosuppression but patient has not been on prednisone. Regardless, pt seems to be improving. Continue IV Diflucan. Per primary/GI.  2. 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 F/U with [facility] on discharge for plan for permanent access.  3. ESRD -on CCPD. Poor clearance despite adjustments. SCr 18.05 BUN 51 K+ 4.5 3. Anemia - HGB 8.2. Rec'd Aranesp 60 mcg SQ 08/16/17. Checked iron panel-Tsat 68, no evidence of IDA. Follow HGB.  4. Secondary hyperparathyroidism -Ca 7.8 Phos 7.7 decrease sensipar to 30 mg PO daily. Resume binders when eating. Cont VDRA.   5. HTN/volume -BP stable today. Wt again unchanged. Used 2.5% solution over night. Says she believes last bag is  not draining completely-this would likely effect the wt but not clearance. Will do stat drain at end of last exchange and re-weigh pt.  Scr 18.05 after additional exchange added. Will need to adjust orders again.  6. Nutrition -Albumin 2.8. K+ higher than usual. Will need renal diet until clearance issues resolved with PD.  Add prostat, renal vits.   Patricia [name] H. Eugene Isadore NP-C 08/17/2017, 9:19 AM  [employer] [phone]

## 2017-08-17 NOTE — Progress Notes (Signed)
Patient states that she is swallowing better today.  She would like to try more of a solid diet.  We will try her on a soft diet and see how she does.  Her clinical diagnosis is Candida esophagitis and she is improving with Diflucan.  We will see how she does with a soft diet.

## 2017-08-18 DIAGNOSIS — R1310 Dysphagia, unspecified: Secondary | ICD-10-CM

## 2017-08-18 LAB — RENAL FUNCTION PANEL
Albumin: 2.7 g/dL — ABNORMAL LOW (ref 3.5–5.0)
Anion gap: 19 — ABNORMAL HIGH (ref 5–15)
BUN: 43 mg/dL — ABNORMAL HIGH (ref 6–20)
CO2: 27 mmol/L (ref 22–32)
Calcium: 7.7 mg/dL — ABNORMAL LOW (ref 8.9–10.3)
Chloride: 93 mmol/L — ABNORMAL LOW (ref 98–111)
Creatinine, Ser: 16.23 mg/dL — ABNORMAL HIGH (ref 0.44–1.00)
GFR calc Af Amer: 3 mL/min — ABNORMAL LOW (ref >60)
GFR calc non Af Amer: 2 mL/min — ABNORMAL LOW (ref >60)
Glucose, Bld: 109 mg/dL — ABNORMAL HIGH (ref 70–99)
Phosphorus: 7.4 mg/dL — ABNORMAL HIGH (ref 2.5–4.6)
Potassium: 3.9 mmol/L (ref 3.5–5.1)
Sodium: 139 mmol/L (ref 135–145)

## 2017-08-18 LAB — GLUCOSE, CAPILLARY
GLUCOSE-CAPILLARY: 103 mg/dL — AB (ref 70–99)
GLUCOSE-CAPILLARY: 120 mg/dL — AB (ref 70–99)
GLUCOSE-CAPILLARY: 91 mg/dL (ref 70–99)
Glucose-Capillary: 96 mg/dL (ref 70–99)
Glucose-Capillary: 86 mg/dL (ref 70–99)
Glucose-Capillary: 87 mg/dL (ref 70–99)
Glucose-Capillary: 130 mg/dL — ABNORMAL HIGH (ref 70–99)

## 2017-08-18 MED ORDER — FLUCONAZOLE 100 MG PO TABS
200.0000 mg | ORAL_TABLET | Freq: Every day | ORAL | Status: DC
  Administered 2017-08-18 – 2017-08-19 (×2): 200 mg via ORAL
  Filled 2017-08-18 (×2): qty 2

## 2017-08-18 NOTE — Discharge Summary (Addendum)
Physician Discharge Summary  Patricia [name] WGN:[id number] DOB: [birth year] DOA: 08/14/2017  PCP: Blair Heys, PA-C  Admit date: 08/14/2017 Discharge date: 08/19/2017  Admitted From: Home Disposition:  Home  Discharge Condition:Stable CODE STATUS:FULL Diet recommendation: Renal   Brief/Interim Summary: Patient is a 42 year old female with hypertension, hyperlipidemia, diabetes mellitus with ESRD on peritoneal dialysis, gout, GERD came with dysphagia to solids and liquids for the past few days. Patient had EGD in the past which showed moderate hiatal hernia, duodenitis, gastritis.  Gastroenterology was consulted.  Patient suspected to have esophageal candidiasis and was started on IV fluconazole.  Her dysphagia symptoms have improved.  Nephrology was also following as she was on peritoneal dialysis. Patient is currently hemodynamically stable.  She is tolerating soft diet.  She will be discharged home with oral fluconazole that will be continued for 16 more days to complete the course of 21 days.  We have recommended to take soft diet at home for the next 3 to 5 days.  Following problems were addressed during hospitalization:  1. Dysphagia likely from Candida esophagitis-suspected to be from Candida esophagitis.  Did not have any EGD here on this admission .Patient earlier had EGD in May 2019 which showed duodenitis and gastritis, along with moderate hiatal hernia. GI was consulted and patient started on IV Diflucan.  Patient is swallowing better.  Diet has been advanced to soft diet.  Continue fluconazole at home for total of 21 days.  She will follow-up with gastroenterology as an outpatient 2. ESRD on peritoneal dialysis-continue Sensipar, Rocaltrol, Rena-Vite.  Nephrology has been consulted.  Dose of Sensipar reduced to 30 mg daily on discharge. 3. Diabetes mellitus -resume home regimen  4. hypertension-blood pressure is controlled, continue metoprolol, lisinopril 5. History of gout-continue  allopurinol 6. History of anxiety-Effexor 7. Anemia of chronic disease-secondary to renal failure, hemoglobin stable     Discharge Diagnoses:  Principal Problem:   Dysphagia Active Problems:   DM (diabetes mellitus) ([place])   ESRD (end stage renal disease) [facility])    Discharge Instructions  Discharge Instructions    Diet - low sodium heart healthy   Complete by:  As directed    Take soft diet for next 3-4 days   Discharge instructions   Complete by:  As directed    1) Please take soft diet only for next 3-5 days. 2) Take prescribed medications as instructed. 3) Follow up with gastroenterology as an outpatient.  Name and number the provider has been attached.   Increase activity slowly   Complete by:  As directed      Allergies as of 08/19/2017      Reactions   Nsaids Other (See Comments)   Cannot take due to Kidney disease, Kidney disease, Kidney function   Tolmetin Other (See Comments)   Cannot take due to Kidney Disease      Medication List    TAKE these medications   allopurinol 100 MG tablet Commonly known as:  ZYLOPRIM Take 100 mg by mouth daily.   ALPRAZolam 1 MG tablet Commonly known as:  XANAX Take 1 mg by mouth 2 (two) times daily as needed for anxiety.   aspirin 81 MG chewable tablet Chew 1 tablet (81 mg total) by mouth daily.   atorvastatin 40 MG tablet Commonly known as:  LIPITOR Take 40 mg by mouth daily.   calcitRIOL 0.5 MCG capsule Commonly known as:  ROCALTROL Take 1 capsule (0.5 mcg total) by mouth daily with breakfast.   cinacalcet 30 MG tablet Commonly  known as:  SENSIPAR Take 1 tablet (30 mg total) by mouth daily. What changed:    medication strength  how much to take   diphenhydramine-acetaminophen 25-500 MG Tabs tablet Commonly known as:  TYLENOL PM Take 2 tablets by mouth at bedtime.   fluconazole 200 MG tablet Commonly known as:  DIFLUCAN Take 1 tablet (200 mg total) by mouth daily for 16 days. Start taking on:   08/20/2017   gentamicin cream 0.1 % Commonly known as:  GARAMYCIN Apply 1 application topically daily.   lisinopril 20 MG tablet Commonly known as:  PRINIVIL,ZESTRIL Take 20 mg by mouth daily.   metoprolol tartrate 25 MG tablet Commonly known as:  LOPRESSOR Take 25 mg by mouth daily as needed (for high BP > 140).   multivitamin Tabs tablet Take 1 tablet by mouth daily.   ondansetron 8 MG tablet Commonly known as:  ZOFRAN Take 8 mg by mouth every 8 (eight) hours as needed for nausea or vomiting.   oxyCODONE-acetaminophen 5-325 MG tablet Commonly known as:  PERCOCET/ROXICET Take 1-2 tablets by mouth every 6 (six) hours as needed for severe pain.   pantoprazole 40 MG tablet Commonly known as:  PROTONIX Take 1 tablet (40 mg total) by mouth 2 (two) times daily before a meal. What changed:  when to take this   promethazine 12.5 MG tablet Commonly known as:  PHENERGAN Take 12.5 mg by mouth every 6 (six) hours as needed for nausea or vomiting.   senna 8.6 MG Tabs tablet Commonly known as:  SENOKOT Take 2 tablets (17.2 mg total) by mouth at bedtime.   sevelamer carbonate 800 MG tablet Commonly known as:  RENVELA Take 1,600-3,200 mg by mouth See admin instructions. Take 3200 mg by mouth 3 times daily with meals and take 1600 mg by mouth with snacks.   venlafaxine XR 75 MG 24 hr capsule Commonly known as:  EFFEXOR-XR Take 75 mg by mouth daily with breakfast.      Follow-up Information    Long, Morrie Sheldon, PA-C. Schedule an appointment as soon as possible for a visit in 1 week(s).   Specialty:  Physician Assistant Contact information: [address] [place] [address] [phone]        Graylin Shiver, MD. Schedule an appointment as soon as possible for a visit in 4 week(s).   Specialty:  Gastroenterology Contact information: [address] [place] [address] [phone]          Allergies  Allergen Reactions  . Nsaids Other (See  Comments)    Cannot take due to Kidney disease, Kidney disease, Kidney function   . Tolmetin Other (See Comments)    Cannot take due to Kidney Disease    Consultations:  GI,Nephrology   Procedures/Studies: Dg Chest 2 View  Result Date: 08/14/2017 CLINICAL DATA:  Pt complains of mid-left chest pain that comes and goes, worsening since yesterday. Pt states she is unable to fully lift L arm. Pt hx HTN. Nonsmoker EXAM: CHEST - 2 VIEW COMPARISON:  04/04/2017 FINDINGS: The heart size and mediastinal contours are within normal limits. Both lungs are clear. No pleural effusion or pneumothorax. The visualized skeletal structures are unremarkable. IMPRESSION: No active cardiopulmonary disease. Electronically Signed   By: Amie Portland M.D.   On: 08/14/2017 18:18   Ct Chest W Contrast  Result Date: 08/15/2017 CLINICAL DATA:  Acute onset of difficulty swallowing and epigastric abdominal pain. EXAM: CT CHEST WITH CONTRAST TECHNIQUE: Multidetector CT imaging of the  chest was performed during intravenous contrast administration. CONTRAST:  89mL OMNIPAQUE IOHEXOL 300 MG/ML  SOLN COMPARISON:  Chest radiograph performed 08/14/2017, and CTA of the chest performed 07/04/2011 FINDINGS: Cardiovascular: The heart is normal in size. The thoracic aorta is unremarkable. The great vessels are grossly unremarkable. Mediastinum/Nodes: There is diffuse wall thickening along the distal esophagus, which may reflect esophagitis. Visualized mediastinal nodes remain normal in size. No pericardial effusion is identified. The visualized portions of the thyroid gland are unremarkable. No axillary lymphadenopathy is seen. Lungs/Pleura: Minimal scarring is noted at the lower lobes. The lungs are otherwise clear. No focal consolidation, pleural effusion or pneumothorax is seen. No masses are identified. Upper Abdomen: Trace free air is noted adjacent to the liver at the right upper quadrant. The visualized portions of the liver and  spleen are otherwise unremarkable. The visualized portions of the pancreas, adrenal glands and right kidney are within normal limits. Musculoskeletal: No acute osseous abnormalities are identified. The visualized musculature is unremarkable in appearance. IMPRESSION: 1. Diffuse wall thickening along the distal esophagus, which may reflect esophagitis. Would correlate for associated symptoms. 2. Trace free air noted adjacent to the liver at the right upper quadrant. CT of the abdomen and pelvis is recommended for further evaluation, to assess for bowel perforation. 3. Minimal scarring at the lower lung lobes. Lungs otherwise clear. Critical Value/emergent results were called by telephone at the time of interpretation on 08/15/2017 at 12:55 am to Dr. Corrie Dandy, who verbally acknowledged these results. Electronically Signed   By: Garald Balding M.D.   On: 08/15/2017 00:57   Ct Abdomen Pelvis W Contrast  Result Date: 08/15/2017 CLINICAL DATA:  Acute abdominal pain. Chest pain x2 days. History of peritoneal dialysis. EXAM: CT ABDOMEN AND PELVIS WITH CONTRAST TECHNIQUE: Multidetector CT imaging of the abdomen and pelvis was performed using the standard protocol following bolus administration of intravenous contrast. CONTRAST:  28mL ISOVUE-300 IOPAMIDOL (ISOVUE-300) INJECTION 61% COMPARISON:  Normal heart size without pericardial effusion FINDINGS: Lower chest: Heart size is normal.  Clear lung bases. Hepatobiliary: Trace perihepatic dialysate with small focus of free intraperitoneal air beneath the right diaphragm similar to 06/02/2017 exam and likely related to peritoneal dialysis. No biliary dilatation. Mild fatty infiltration along the falciform. Small subcapsular cyst in left hepatic lobe too small to further characterize measuring 8 mm.Physiologically distended gallbladder. No biliary dilatation. Faint linear density in the gallbladder may represent tiny calculus abutting what may represent some biliary  sludge. Pancreas: Normal Spleen: Normal Adrenals/Urinary Tract: Normal bilateral adrenal glands. Renal mass nephrolithiasis nor obstructive uropathy. Urinary bladder is unremarkable. Stomach/Bowel: Stomach is within normal limits. Appendix appears normal. Increased colonic stool burden is noted consistent constipation with scattered colonic diverticulosis. Trace edema along the left paracolic gutter may be secondary to peritoneal dialysis. Alternatively mild descending colonic diverticulitis might account for this appearance. Vascular/Lymphatic: No significant vascular findings are present. No enlarged abdominal or pelvic lymph nodes. Reproductive: Status post hysterectomy. No adnexal masses. Other: Peritoneal dialysis catheter from left lower quadrant approach is identified with tip coiled in the mid pelvis. Small fat containing umbilical hernia. Supraumbilical fat containing hernia is also noted measuring 2.5 x 5.2 x 3.5 cm. Musculoskeletal: No acute osseous abnormality. IMPRESSION: 1. Small umbilical and supraumbilical fat containing ventral hernias. 2. Tiny focus of free intraperitoneal gas in the right upper quadrant similar to prior CT and likely represents stigmata peritoneal dialysis. Peritoneal dialysis catheter tip is seen coiled in the mid pelvis. 3. Increased colonic stool burden. Scattered colonic  diverticulosis is noted. 4. Along the descending colon is trace pericolonic edema along the left paracolic gutter that may simply reflect stigmata of peritoneal dialysis. Left-sided diverticulitis without complicating features is also possibility. 5. Faint curvilinear calcification in the gallbladder may represent a tiny stone abutting biliary sludge. Electronically Signed   By: Ashley Royalty M.D.   On: 08/15/2017 02:17      Subjective:  Patient seen and examined the bedside this morning.  Hemodynamically stable.   She still complains of dysphagia but she is feeling better than yesterday. Discharge  Exam: Vitals:   08/19/17 0439 08/19/17 0739  BP: 107/66 109/77  Pulse: 86 89  Resp: 18 18  Temp: 98 F (36.7 C) 98.6 F (37 C)  SpO2: 97% 94%   Vitals:   08/18/17 1714 08/18/17 2044 08/19/17 0439 08/19/17 0739  BP: (!) 119/95 (!) 125/96 107/66 109/77  Pulse: 88 88 86 89  Resp: $Remo'19 18 18 18  'YtHYU$ Temp: 98.4 F (36.9 C) 98.1 F (36.7 C) 98 F (36.7 C) 98.6 F (37 C)  TempSrc: Oral Oral  Oral  SpO2: 100% 98% 97% 94%  Weight:      Height:        General: Pt is alert, awake, not in acute distress,obese Cardiovascular: RRR, S1/S2 +, no rubs, no gallops Respiratory: CTA bilaterally, no wheezing, no rhonchi Abdominal: Soft, NT, ND, bowel sounds +, peritoneal dialysis catheter Extremities: no edema, no cyanosis    The results of significant diagnostics from this hospitalization (including imaging, microbiology, ancillary and laboratory) are listed below for reference.     Microbiology: Recent Results (from the past 240 hour(s))  MRSA PCR Screening     Status: None   Collection Time: 08/11/17 12:31 PM  Result Value Ref Range Status   MRSA by PCR NEGATIVE NEGATIVE Final    Comment:        The GeneXpert MRSA Assay (FDA approved for NASAL specimens only), is one component of a comprehensive MRSA colonization surveillance program. It is not intended to diagnose MRSA infection nor to guide or monitor treatment for MRSA infections. Performed at [facility], [place] [address], [place], [place] [address]      Labs: BNP (last 3 results) No results for input(s): BNP in the last 8760 hours. Basic Metabolic Panel: Recent Labs  Lab 08/16/17 0452 08/16/17 1200 08/17/17 0514 08/18/17 1004 08/19/17 0348  NA 136 134* 137 139 138  K 5.6* 4.8 4.5 3.9 4.6  CL 91* 88* 90* 93* 94*  CO2 $Re'22 26 26 27 24  'wOM$ GLUCOSE 95 90 109* 109* 120*  BUN 55* 57* 51* 43* 46*  CREATININE 18.32* 18.31* 18.05* 16.23* 16.74*  CALCIUM 7.9* 7.5* 7.8* 7.7* 7.7*  PHOS  --  7.7*  --  7.4*  --    Liver  Function Tests: Recent Labs  Lab 08/16/17 0452 08/16/17 1200 08/18/17 1004  AST 22  --   --   ALT <5  --   --   ALKPHOS 44  --   --   BILITOT 0.5  --   --   PROT 6.1*  --   --   ALBUMIN 2.8* 2.8* 2.7*   No results for input(s): LIPASE, AMYLASE in the last 168 hours. No results for input(s): AMMONIA in the last 168 hours. CBC: Recent Labs  Lab 08/14/17 1821 08/16/17 0452 08/17/17 0514  WBC 6.6 6.0 5.5  HGB 8.3* 8.3* 8.2*  HCT 26.0* 25.5* 25.5*  MCV 83.6 82.0 83.6  PLT 222 225 251   Cardiac Enzymes: No results for input(s): CKTOTAL, CKMB, CKMBINDEX, TROPONINI in the last 168 hours. BNP: Invalid input(s): POCBNP CBG: Recent Labs  Lab 08/18/17 1717 08/18/17 2006 08/18/17 2341 08/19/17 0439 08/19/17 0743  GLUCAP 87 130* 120* 119* 117*   D-Dimer No results for input(s): DDIMER in the last 72 hours. Hgb A1c No results for input(s): HGBA1C in the last 72 hours. Lipid Profile No results for input(s): CHOL, HDL, LDLCALC, TRIG, CHOLHDL, LDLDIRECT in the last 72 hours. Thyroid function studies No results for input(s): TSH, T4TOTAL, T3FREE, THYROIDAB in the last 72 hours.  Invalid input(s): FREET3 Anemia work up Recent Labs    08/16/17 1200  TIBC 203*  IRON 138   Urinalysis    Component Value Date/Time   COLORURINE YELLOW 04/03/2016 1626   APPEARANCEUR HAZY (A) 04/03/2016 1626   LABSPEC 1.012 04/03/2016 1626   PHURINE 6.0 04/03/2016 1626   GLUCOSEU 150 (A) 04/03/2016 1626   HGBUR NEGATIVE 04/03/2016 1626   BILIRUBINUR NEGATIVE 04/03/2016 1626   KETONESUR NEGATIVE 04/03/2016 1626   PROTEINUR >=300 (A) 04/03/2016 1626   UROBILINOGEN 0.2 09/19/2014 1620   NITRITE NEGATIVE 04/03/2016 1626   LEUKOCYTESUR NEGATIVE 04/03/2016 1626   Sepsis Labs Invalid input(s): PROCALCITONIN,  WBC,  LACTICIDVEN Microbiology Recent Results (from the past 240 hour(s))  MRSA PCR Screening     Status: None   Collection Time: 08/11/17 12:31 PM  Result Value Ref Range Status    MRSA by PCR NEGATIVE NEGATIVE Final    Comment:        The GeneXpert MRSA Assay (FDA approved for NASAL specimens only), is one component of a comprehensive MRSA colonization surveillance program. It is not intended to diagnose MRSA infection nor to guide or monitor treatment for MRSA infections. Performed at [facility], [place] [address], [place],  [address]     Please note: You were cared for by a hospitalist during your hospital stay. Once you are discharged, your primary care physician will handle any further medical issues. Please note that NO REFILLS for any discharge medications will be authorized once you are discharged, as it is imperative that you return to your primary care physician (or establish a relationship with a primary care physician if you do not have one) for your post hospital discharge needs so that they can reassess your need for medications and monitor your lab values.    Time coordinating discharge: 40 minutes  SIGNED:   Shelly Coss, MD  [facility] 08/19/2017, 11:01 AM Pager [phone]  If 7PM-7AM, please contact night-coverage [email or web address] Password [email or web address]

## 2017-08-18 NOTE — Progress Notes (Addendum)
PROGRESS NOTE    Patricia [name]  AUQ:[id number] DOB: 1975-08-15 DOA: 08/14/2017 PCP: Blair Heys, PA-C   Brief Narrative:  Patient is a 42 year old female with hypertension, hyperlipidemia, diabetes mellitus with ESRD on peritoneal dialysis, gout, GERD came with dysphagia to solids and liquids for the past few days. Patient had EGD in the past which showed moderate hiatal hernia, duodenitis, gastritis.  Gastroenterology was consulted.  Patient suspected to have esophageal candidiasis and was started on IV fluconazole.  Her dysphagia symptoms have improved.  Nephrology was also following as she was on peritoneal dialysis. Patient is currently hemodynamically stable.  She is tolerating soft diet but still complaining of pain on swallowing. Will continue IV fluconazole today. Probably she she will be discharged home with oral fluconazole that will be continued for 10 more days.   Assessment & Plan:   Principal Problem:   Dysphagia Active Problems:   DM (diabetes mellitus) ([place])   ESRD (end stage renal disease) ([place])   1. Dysphagialikely from Candida esophagitis-patient earlier had EGD in May 2019 which showed duodenitis and gastritis, along with moderate hiatal hernia. GI was consulted and patient started on IV Diflucan. Patient is swallowing better. Still has some pain on swallowing.Diet has been advanced to soft diet.  Continue fluconazole at home. 2. ESRD on peritoneal dialysis-continue Sensipar, Rocaltrol, Rena-Vite. Nephrology has been consulted.  Dose of Sensipar reduced to 30 mg daily on discharge. 3. Diabetes mellitus-resume home regimen  4. hypertension-blood pressure is controlled, continue metoprolol, lisinopril 5. History of gout-continue allopurinol 6. History of anxiety-Effexor 7. Anemiaofchronic disease-secondary to renal failure,hemoglobin stable    DVT prophylaxis: SCD Code Status: Full Family Communication: None present at the bed side Disposition Plan:  Likely home tomorrow   Consultants: GI, nephrology  Procedures: Peritoneal dialysis  Antimicrobials: Fluconazole  Subjective: Patient seen and examined the bedside this morning.  Swallowing is better but still complains of some discomfort .  Patient says she is not ready to go home today.  Underwent peritoneal dialysis today.  Objective: Vitals:   08/17/17 2024 08/18/17 0446 08/18/17 0826 08/18/17 0949  BP: 132/90 (!) 151/81 124/70 122/63  Pulse: 85 81 83 81  Resp: $Remo'18 18 19 19  'IjEuz$ Temp: 98.1 F (36.7 C) 98.1 F (36.7 C) 98 F (36.7 C) 97.8 F (36.6 C)  TempSrc: Oral Oral Oral Oral  SpO2: 96% 100% 97% 97%  Weight:      Height:        Intake/Output Summary (Last 24 hours) at 08/18/2017 1240 Last data filed at 08/18/2017 0517 Gross per 24 hour  Intake 280 ml  Output 0 ml  Net 280 ml   Filed Weights   08/16/17 0755 08/16/17 1818 08/17/17 1819  Weight: 116.3 kg 116.3 kg 114.7 kg    Examination:  General exam: Appears calm and comfortable ,Not in distress,obese HEENT:PERRL,Oral mucosa moist, Ear/Nose normal on gross exam Respiratory system: Bilateral equal air entry, normal vesicular breath sounds, no wheezes or crackles  Cardiovascular system: S1 & S2 heard, RRR. No JVD, murmurs, rubs, gallops or clicks. No pedal edema. Gastrointestinal system: Abdomen is nondistended, soft and nontender. No organomegaly or masses felt. Normal bowel sounds heard.PD catheter Central nervous system: Alert and oriented. No focal neurological deficits. Extremities: No edema, no clubbing ,no cyanosis, distal peripheral pulses palpable. Skin: No rashes, lesions or ulcers,no icterus ,no pallor MSK: Normal muscle bulk,tone ,power Psychiatry: Judgement and insight appear normal. Mood & affect appropriate.     Data Reviewed: I have personally reviewed following  labs and imaging studies  CBC: Recent Labs  Lab 08/11/17 1853 08/14/17 1821 08/16/17 0452 08/17/17 0514  WBC  --  6.6 6.0 5.5    HGB 8.2* 8.3* 8.3* 8.2*  HCT 24.0* 26.0* 25.5* 25.5*  MCV  --  83.6 82.0 83.6  PLT  --  222 225 510   Basic Metabolic Panel: Recent Labs  Lab 08/14/17 1821 08/16/17 0452 08/16/17 1200 08/17/17 0514 08/18/17 1004  NA 137 136 134* 137 139  K 4.0 5.6* 4.8 4.5 3.9  CL 89* 91* 88* 90* 93*  CO2 $Re'26 22 26 26 27  'qPd$ GLUCOSE 94 95 90 109* 109*  BUN 50* 55* 57* 51* 43*  CREATININE 18.04* 18.32* 18.31* 18.05* 16.23*  CALCIUM 7.3* 7.9* 7.5* 7.8* 7.7*  PHOS  --   --  7.7*  --  7.4*   GFR: Estimated Creatinine Clearance: 5.8 mL/min (A) (by C-G formula based on SCr of 16.23 mg/dL (H)). Liver Function Tests: Recent Labs  Lab 08/16/17 0452 08/16/17 1200 08/18/17 1004  AST 22  --   --   ALT <5  --   --   ALKPHOS 44  --   --   BILITOT 0.5  --   --   PROT 6.1*  --   --   ALBUMIN 2.8* 2.8* 2.7*   No results for input(s): LIPASE, AMYLASE in the last 168 hours. No results for input(s): AMMONIA in the last 168 hours. Coagulation Profile: No results for input(s): INR, PROTIME in the last 168 hours. Cardiac Enzymes: No results for input(s): CKTOTAL, CKMB, CKMBINDEX, TROPONINI in the last 168 hours. BNP (last 3 results) No results for input(s): PROBNP in the last 8760 hours. HbA1C: No results for input(s): HGBA1C in the last 72 hours. CBG: Recent Labs  Lab 08/17/17 2024 08/17/17 2359 08/18/17 0445 08/18/17 0829 08/18/17 1200  GLUCAP 115* 103* 91 96 86   Lipid Profile: No results for input(s): CHOL, HDL, LDLCALC, TRIG, CHOLHDL, LDLDIRECT in the last 72 hours. Thyroid Function Tests: No results for input(s): TSH, T4TOTAL, FREET4, T3FREE, THYROIDAB in the last 72 hours. Anemia Panel: Recent Labs    08/16/17 1200  TIBC 203*  IRON 138   Sepsis Labs: No results for input(s): PROCALCITON, LATICACIDVEN in the last 168 hours.  Recent Results (from the past 240 hour(s))  MRSA PCR Screening     Status: None   Collection Time: 08/11/17 12:31 PM  Result Value Ref Range Status   MRSA  by PCR NEGATIVE NEGATIVE Final    Comment:        The GeneXpert MRSA Assay (FDA approved for NASAL specimens only), is one component of a comprehensive MRSA colonization surveillance program. It is not intended to diagnose MRSA infection nor to guide or monitor treatment for MRSA infections. Performed at [facility], [place] [address] [place], [place] [address]          Radiology Studies: No results found.      Scheduled Meds: . allopurinol  100 mg Oral Daily  . aspirin  81 mg Oral Daily  . atorvastatin  40 mg Oral Daily  . calcitRIOL  0.5 mcg Oral Q breakfast  . cinacalcet  30 mg Oral Q breakfast  . darbepoetin (ARANESP) injection - NON-DIALYSIS  60 mcg Subcutaneous Q Mon-1800  . gentamicin cream  1 application Topical Daily  . insulin aspart  0-9 Units Subcutaneous Q4H  . lisinopril  20 mg Oral Daily  . mouth rinse  15 mL Mouth  Rinse BID  . multivitamin  1 tablet Oral QHS  . senna  2 tablet Oral QHS  . sevelamer carbonate  3,200 mg Oral TID WC  . venlafaxine XR  75 mg Oral Q breakfast   Continuous Infusions: . famotidine (PEPCID) IV 20 mg (08/18/17 1023)  . fluconazole (DIFLUCAN) IV 200 mg (08/17/17 1333)     LOS: 3 days    Time spent: 25 mins.More than 50% of that time was spent in counseling and/or coordination of care.      Shelly Coss, MD [facility] Pager [phone]  If 7PM-7AM, please contact night-coverage [email or web address] Password [facility] 08/18/2017, 12:40 PM

## 2017-08-18 NOTE — Progress Notes (Signed)
[facility] Progress Note   Subjective: No C/Os. Says she feels better, swallowing improved. Feels like she is ready to go home.    Objective Vitals:   08/17/17 1819 08/17/17 2024 08/18/17 0446 08/18/17 0826  BP: 134/83 132/90 (!) 151/81 124/70  Pulse: 81 85 81 83  Resp: $Remo'18 18 18 19  'gNpZL$ Temp: 98.9 F (37.2 C) 98.1 F (36.7 C) 98.1 F (36.7 C) 98 F (36.7 C)  TempSrc: Oral Oral Oral Oral  SpO2: 99% 96% 100% 97%  Weight: 114.7 kg     Height:       Physical Exam General: Pleasant, NAD Heart: RRR Lungs: CTAB Abdomen: S,NT Extremities: No LE edema Dialysis Access: LUQ PD cath. Drsg CDI.    Additional Objective Labs: Basic Metabolic Panel: Recent Labs  Lab 08/16/17 0452 08/16/17 1200 08/17/17 0514  NA 136 134* 137  K 5.6* 4.8 4.5  CL 91* 88* 90*  CO2 $Re'22 26 26  'wks$ GLUCOSE 95 90 109*  BUN 55* 57* 51*  CREATININE 18.32* 18.31* 18.05*  CALCIUM 7.9* 7.5* 7.8*  PHOS  --  7.7*  --    Liver Function Tests: Recent Labs  Lab 08/16/17 0452 08/16/17 1200  AST 22  --   ALT <5  --   ALKPHOS 44  --   BILITOT 0.5  --   PROT 6.1*  --   ALBUMIN 2.8* 2.8*   No results for input(s): LIPASE, AMYLASE in the last 168 hours. CBC: Recent Labs  Lab 08/14/17 1821 08/16/17 0452 08/17/17 0514  WBC 6.6 6.0 5.5  HGB 8.3* 8.3* 8.2*  HCT 26.0* 25.5* 25.5*  MCV 83.6 82.0 83.6  PLT 222 225 251   Blood Culture    Component Value Date/Time   SDES PERITONEAL DIALYSATE 06/03/2017 0240   SPECREQUEST NONE 06/03/2017 0240   CULT  06/03/2017 0240    NO GROWTH 3 DAYS Performed at [facility], [place] [address], [place],  [address]    REPTSTATUS 06/06/2017 FINAL 06/03/2017 0240    Cardiac Enzymes: No results for input(s): CKTOTAL, CKMB, CKMBINDEX, TROPONINI in the last 168 hours. CBG: Recent Labs  Lab 08/17/17 1619 08/17/17 2024 08/17/17 2359 08/18/17 0445 08/18/17 0829  GLUCAP 83 115* 103* 91 96   Iron Studies:  Recent Labs    08/16/17 1200   IRON 138  TIBC 203*   '@lablastinr3'$ @ Studies/Results: No results found. Medications: . famotidine (PEPCID) IV 20 mg (08/17/17 1021)  . fluconazole (DIFLUCAN) IV 200 mg (08/17/17 1333)   . allopurinol  100 mg Oral Daily  . aspirin  81 mg Oral Daily  . atorvastatin  40 mg Oral Daily  . calcitRIOL  0.5 mcg Oral Q breakfast  . cinacalcet  30 mg Oral Q breakfast  . darbepoetin (ARANESP) injection - NON-DIALYSIS  60 mcg Subcutaneous Q Mon-1800  . gentamicin cream  1 application Topical Daily  . insulin aspart  0-9 Units Subcutaneous Q4H  . lisinopril  20 mg Oral Daily  . mouth rinse  15 mL Mouth Rinse BID  . multivitamin  1 tablet Oral QHS  . senna  2 tablet Oral QHS  . sevelamer carbonate  3,200 mg Oral TID WC  . venlafaxine XR  75 mg Oral Q breakfast     CCPD Orders: 7X Week, EDW 108.5 (kg) Ca 2.5 (mEq/L) Mg 0.5 (mEq/L) Dextrose 1.5; 2.5; 4.25 %, # Exchanges 5, fill vol 3000 mL, Dwell Time 1 hrs 30 min, last fill vol 3000 mL, Day Exchanges  1, daytime fill vol 3000 mL, Day Dwell Time 4 hrs   Assessment/Plan: 1.Dyphagia/Possible Candida Esophagitis-seen by GI. Thought to have esophageal yeast D/T immunosuppression but patient has not been on prednisone. Regardless, pt seems to be improving. Transition IV Diflucan to oral meds. Per primary/GI.  2. 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 F/U with [facility] on discharge for plan for permanent access.  3.ESRD -on CCPD.Poor clearance despite adjustments. SCr 18.05 BUN 51 K+ 4.5 3. Anemia -HGB 8.2. Rec'd Aranesp40mcg SQ 08/16/17. Checked iron panel-Tsat 68, no evidence of IDA. Follow HGB.  4. Secondary hyperparathyroidism -Ca 7.8 Phos 7.7 decrease sensipar to 30 mg PO daily. Resume binders when eating.Cont VDRA.  5. HTN/volume -BP stable today. Wt down 114.7. Used 2.5% solution over night. Can fine tune clearance issues at OP HD center.  6. Nutrition -Albumin 2.8. K+ higher than usual. Will need renal diet until clearance issues  resolved with PD.  Add prostat, renal vits.  Disposition: Home today.   Naika Noto H. Kamalei Roeder NP-C 08/18/2017, 9:39 AM  [employer] [phone]

## 2017-08-18 NOTE — Care Management Important Message (Signed)
Important Message  Patient Details  Name: Patricia [name] MRN: [id number] Date of Birth: [birth year]   Medicare Important Message Given:  Yes    [name] [name] 08/18/2017, 2:37 PM

## 2017-08-18 NOTE — Progress Notes (Signed)
Still a little discomfort with swallowing harder things like pretzels.  Other softer foods are not painful.  I would recommend giving her a full 3-week course of Diflucan for Candida esophagitis.  She can probably go home tomorrow.  We will sign off.  Call us if needed.

## 2017-08-18 NOTE — Progress Notes (Signed)
Peripheral IV infiltrated, couldn't give Diflucan IV notified Dr. Tawanna Solo, going order orally.

## 2017-08-19 ENCOUNTER — Telehealth: Payer: Self-pay | Admitting: Vascular Surgery

## 2017-08-19 LAB — BASIC METABOLIC PANEL
Anion gap: 20 — ABNORMAL HIGH (ref 5–15)
BUN: 46 mg/dL — AB (ref 6–20)
CO2: 24 mmol/L (ref 22–32)
CREATININE: 16.74 mg/dL — AB (ref 0.44–1.00)
Calcium: 7.7 mg/dL — ABNORMAL LOW (ref 8.9–10.3)
Chloride: 94 mmol/L — ABNORMAL LOW (ref 98–111)
GFR calc Af Amer: 3 mL/min — ABNORMAL LOW (ref >60)
GFR calc non Af Amer: 2 mL/min — ABNORMAL LOW (ref >60)
GLUCOSE: 120 mg/dL — AB (ref 70–99)
POTASSIUM: 4.6 mmol/L (ref 3.5–5.1)
SODIUM: 138 mmol/L (ref 135–145)

## 2017-08-19 LAB — GLUCOSE, CAPILLARY
GLUCOSE-CAPILLARY: 119 mg/dL — AB (ref 70–99)
Glucose-Capillary: 117 mg/dL — ABNORMAL HIGH (ref 70–99)
Glucose-Capillary: 85 mg/dL (ref 70–99)

## 2017-08-19 MED ORDER — CINACALCET HCL 30 MG PO TABS: 30.0000 mg | ORAL_TABLET | Freq: Every day | ORAL | 0 refills | Status: DC

## 2017-08-19 MED ORDER — FLUCONAZOLE 200 MG PO TABS: 200.0000 mg | ORAL_TABLET | Freq: Every day | ORAL | 0 refills | Status: DC

## 2017-08-19 NOTE — Progress Notes (Addendum)
[facility] Progress Note   Subjective: Says she still feels like something is stuck in her throat. Doesn't want to leave hospital until problem in resolved. Did eat more today than she has since admission. No issues with PD overnight.   Objective Vitals:   08/18/17 1714 08/18/17 2044 08/19/17 0439 08/19/17 0739  BP: (!) 119/95 (!) 125/96 107/66 109/77  Pulse: 88 88 86 89  Resp: $Remo'19 18 18 18  'Fmeac$ Temp: 98.4 F (36.9 C) 98.1 F (36.7 C) 98 F (36.7 C) 98.6 F (37 C)  TempSrc: Oral Oral  Oral  SpO2: 100% 98% 97% 94%  Weight:      Height:       Physical Exam General: Obese, pleasant, NAD Heart: RRR Lungs: CTAB Abdomen: S,NT Extremities: No LE edema Dialysis Access: LUQ PD cath, drsg CDI.    Additional Objective Labs: Basic Metabolic Panel: Recent Labs  Lab 08/16/17 1200 08/17/17 0514 08/18/17 1004 08/19/17 0348  NA 134* 137 139 138  K 4.8 4.5 3.9 4.6  CL 88* 90* 93* 94*  CO2 $Re'26 26 27 24  'miE$ GLUCOSE 90 109* 109* 120*  BUN 57* 51* 43* 46*  CREATININE 18.31* 18.05* 16.23* 16.74*  CALCIUM 7.5* 7.8* 7.7* 7.7*  PHOS 7.7*  --  7.4*  --    Liver Function Tests: Recent Labs  Lab 08/16/17 0452 08/16/17 1200 08/18/17 1004  AST 22  --   --   ALT <5  --   --   ALKPHOS 44  --   --   BILITOT 0.5  --   --   PROT 6.1*  --   --   ALBUMIN 2.8* 2.8* 2.7*   No results for input(s): LIPASE, AMYLASE in the last 168 hours. CBC: Recent Labs  Lab 08/14/17 1821 08/16/17 0452 08/17/17 0514  WBC 6.6 6.0 5.5  HGB 8.3* 8.3* 8.2*  HCT 26.0* 25.5* 25.5*  MCV 83.6 82.0 83.6  PLT 222 225 251   Blood Culture    Component Value Date/Time   SDES PERITONEAL DIALYSATE 06/03/2017 0240   SPECREQUEST NONE 06/03/2017 0240   CULT  06/03/2017 0240    NO GROWTH 3 DAYS Performed at [facility], [place] [address] [place], [place] [address]    REPTSTATUS 06/06/2017 FINAL 06/03/2017 0240    Cardiac Enzymes: No results for input(s): CKTOTAL, CKMB, CKMBINDEX, TROPONINI  in the last 168 hours. CBG: Recent Labs  Lab 08/18/17 1717 08/18/17 2006 08/18/17 2341 08/19/17 0439 08/19/17 0743  GLUCAP 87 130* 120* 119* 117*   Iron Studies:  Recent Labs    08/16/17 1200  IRON 138  TIBC 203*   '@lablastinr3'$ @ Studies/Results: No results found. Medications: . famotidine (PEPCID) IV 20 mg (08/18/17 1023)   . allopurinol  100 mg Oral Daily  . aspirin  81 mg Oral Daily  . atorvastatin  40 mg Oral Daily  . calcitRIOL  0.5 mcg Oral Q breakfast  . cinacalcet  30 mg Oral Q breakfast  . darbepoetin (ARANESP) injection - NON-DIALYSIS  60 mcg Subcutaneous Q Mon-1800  . fluconazole  200 mg Oral Daily  . gentamicin cream  1 application Topical Daily  . insulin aspart  0-9 Units Subcutaneous Q4H  . lisinopril  20 mg Oral Daily  . mouth rinse  15 mL Mouth Rinse BID  . multivitamin  1 tablet Oral QHS  . senna  2 tablet Oral QHS  . sevelamer carbonate  3,200 mg Oral TID WC  . venlafaxine XR  75  mg Oral Q breakfast     CCPD Orders:7X Week, EDW 108.5 (kg) Ca 2.5 (mEq/L) Mg 0.5 (mEq/L) Dextrose 1.5; 2.5; 4.25 %, # Exchanges  6 fills vol 3000 mL, Dwell Time  2 hours last fill vol 3000 mL, Day Exchanges 1, daytime fill vol 3000 mL, Day Dwell Time 4 hrs  Assessment/Plan: 1.Dyphagia/Possible Candida Esophagitis-seen by GI. Thought to have esophageal yeast D/T immunosuppression-possibly related to uremia? TransitionedIV Diflucan to oral meds. Per primary/GI.  2. 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 F/U with [facility] on discharge for plan for permanent access.  3.ESRD -on CCPD.Initial poor clearance. Orders adjusted, extra exchange added. Weight and SCr coming down. 3. Anemia -HGB 8.2.Rec'dAranesp87mcg SQ08/12/19. Checkediron panel-Tsat 68, no evidence of IDA.Follow HGB. 4. Secondary hyperparathyroidism -Ca 7.8 Phos 7.7decrease sensipar to 30 mg PO daily. Resume binders when eating.Cont VDRA. 5. HTN/volume -BPstable today.Wt down 114.7. Used  2.5% solution over night. Can fine tune clearance issues at OP HD center.  6. Nutrition -Albumin 2.8.K+ higher than usual. Will need renal diet until clearance issues resolved with PD.Add prostat, renal vits.  Disposition: Stable for DC from nephrology standpoint. Will adjust CCPD orders on DC.   [name] NP-C 08/19/2017, 9:00 AM  [facility] [phone]  Renal Attending: She needs more intensive dialysis with PD.  New RX includes a dwell time of 2 hours and 6 exchanges. A. Florene Glen, MD

## 2017-08-19 NOTE — Telephone Encounter (Signed)
Reshed. Appt. LVM 09/23/17 CEF 12:30pm p/o MD s/p L arm AVG removal due to steal syndrome

## 2017-08-19 NOTE — Plan of Care (Signed)
  Problem: Pain Managment: Goal: General experience of comfort will improve Outcome: Progressing   

## 2017-09-01 ENCOUNTER — Encounter (HOSPITAL_COMMUNITY): Payer: Self-pay

## 2017-09-01 ENCOUNTER — Emergency Department (HOSPITAL_COMMUNITY): Payer: Medicare Other

## 2017-09-01 ENCOUNTER — Inpatient Hospital Stay (HOSPITAL_COMMUNITY)
Admission: EM | Admit: 2017-09-01 | Discharge: 2017-09-04 | DRG: 377 | Disposition: A | Discharge status: Home / Self Care | Payer: Medicare Other | Attending: Internal Medicine | Admitting: Internal Medicine

## 2017-09-01 DIAGNOSIS — Z833 Family history of diabetes mellitus: Secondary | ICD-10-CM

## 2017-09-01 DIAGNOSIS — Z85038 Personal history of other malignant neoplasm of large intestine: Secondary | ICD-10-CM

## 2017-09-01 DIAGNOSIS — K219 Gastro-esophageal reflux disease without esophagitis: Secondary | ICD-10-CM | POA: Diagnosis present

## 2017-09-01 DIAGNOSIS — Z6838 Body mass index (BMI) 38.0-38.9, adult: Secondary | ICD-10-CM

## 2017-09-01 DIAGNOSIS — D649 Anemia, unspecified: Secondary | ICD-10-CM | POA: Diagnosis not present

## 2017-09-01 DIAGNOSIS — Z8249 Family history of ischemic heart disease and other diseases of the circulatory system: Secondary | ICD-10-CM

## 2017-09-01 DIAGNOSIS — K2980 Duodenitis without bleeding: Secondary | ICD-10-CM | POA: Diagnosis present

## 2017-09-01 DIAGNOSIS — N186 End stage renal disease: Secondary | ICD-10-CM | POA: Diagnosis present

## 2017-09-01 DIAGNOSIS — I151 Hypertension secondary to other renal disorders: Secondary | ICD-10-CM

## 2017-09-01 DIAGNOSIS — D123 Benign neoplasm of transverse colon: Secondary | ICD-10-CM | POA: Diagnosis present

## 2017-09-01 DIAGNOSIS — E1122 Type 2 diabetes mellitus with diabetic chronic kidney disease: Secondary | ICD-10-CM | POA: Diagnosis present

## 2017-09-01 DIAGNOSIS — K2970 Gastritis, unspecified, without bleeding: Secondary | ICD-10-CM | POA: Diagnosis present

## 2017-09-01 DIAGNOSIS — R1310 Dysphagia, unspecified: Secondary | ICD-10-CM

## 2017-09-01 DIAGNOSIS — M109 Gout, unspecified: Secondary | ICD-10-CM | POA: Diagnosis present

## 2017-09-01 DIAGNOSIS — Z800 Family history of malignant neoplasm of digestive organs: Secondary | ICD-10-CM

## 2017-09-01 DIAGNOSIS — K222 Esophageal obstruction: Secondary | ICD-10-CM | POA: Diagnosis present

## 2017-09-01 DIAGNOSIS — D62 Acute posthemorrhagic anemia: Secondary | ICD-10-CM | POA: Diagnosis present

## 2017-09-01 DIAGNOSIS — Z992 Dependence on renal dialysis: Secondary | ICD-10-CM | POA: Diagnosis present

## 2017-09-01 DIAGNOSIS — D631 Anemia in chronic kidney disease: Secondary | ICD-10-CM | POA: Diagnosis present

## 2017-09-01 DIAGNOSIS — E8889 Other specified metabolic disorders: Secondary | ICD-10-CM | POA: Diagnosis present

## 2017-09-01 DIAGNOSIS — I120 Hypertensive chronic kidney disease with stage 5 chronic kidney disease or end stage renal disease: Secondary | ICD-10-CM | POA: Diagnosis present

## 2017-09-01 DIAGNOSIS — I10 Essential (primary) hypertension: Secondary | ICD-10-CM | POA: Diagnosis present

## 2017-09-01 DIAGNOSIS — K921 Melena: Secondary | ICD-10-CM | POA: Diagnosis present

## 2017-09-01 DIAGNOSIS — E7800 Pure hypercholesterolemia, unspecified: Secondary | ICD-10-CM | POA: Diagnosis present

## 2017-09-01 DIAGNOSIS — N2889 Other specified disorders of kidney and ureter: Secondary | ICD-10-CM

## 2017-09-01 DIAGNOSIS — K6389 Other specified diseases of intestine: Secondary | ICD-10-CM | POA: Diagnosis present

## 2017-09-01 DIAGNOSIS — N2581 Secondary hyperparathyroidism of renal origin: Secondary | ICD-10-CM | POA: Diagnosis present

## 2017-09-01 DIAGNOSIS — Z8349 Family history of other endocrine, nutritional and metabolic diseases: Secondary | ICD-10-CM

## 2017-09-01 DIAGNOSIS — K5730 Diverticulosis of large intestine without perforation or abscess without bleeding: Secondary | ICD-10-CM | POA: Diagnosis present

## 2017-09-01 DIAGNOSIS — F419 Anxiety disorder, unspecified: Secondary | ICD-10-CM | POA: Diagnosis not present

## 2017-09-01 DIAGNOSIS — B3781 Candidal esophagitis: Secondary | ICD-10-CM | POA: Diagnosis present

## 2017-09-01 DIAGNOSIS — K449 Diaphragmatic hernia without obstruction or gangrene: Secondary | ICD-10-CM | POA: Diagnosis present

## 2017-09-01 DIAGNOSIS — Z7982 Long term (current) use of aspirin: Secondary | ICD-10-CM

## 2017-09-01 HISTORY — DX: Dyspnea, unspecified: R06.00

## 2017-09-01 LAB — CBC
HCT: 25.7 % — ABNORMAL LOW (ref 36.0–46.0)
Hemoglobin: 7.9 g/dL — ABNORMAL LOW (ref 12.0–15.0)
MCH: 26.4 pg (ref 26.0–34.0)
MCHC: 30.7 g/dL (ref 30.0–36.0)
MCV: 86 fL (ref 78.0–100.0)
PLATELETS: 315 10*3/uL (ref 150–400)
RBC: 2.99 MIL/uL — ABNORMAL LOW (ref 3.87–5.11)
RDW: 16.4 % — AB (ref 11.5–15.5)
WBC: 8.3 10*3/uL (ref 4.0–10.5)

## 2017-09-01 LAB — POC OCCULT BLOOD, ED: FECAL OCCULT BLD: POSITIVE — AB

## 2017-09-01 LAB — BASIC METABOLIC PANEL
ANION GAP: 19 — AB (ref 5–15)
BUN: 49 mg/dL — AB (ref 6–20)
CALCIUM: 7.9 mg/dL — AB (ref 8.9–10.3)
CO2: 24 mmol/L (ref 22–32)
Chloride: 95 mmol/L — ABNORMAL LOW (ref 98–111)
Creatinine, Ser: 15.18 mg/dL — ABNORMAL HIGH (ref 0.44–1.00)
GFR calc Af Amer: 3 mL/min — ABNORMAL LOW (ref >60)
GFR, EST NON AFRICAN AMERICAN: 3 mL/min — AB (ref >60)
GLUCOSE: 99 mg/dL (ref 70–99)
Potassium: 4.1 mmol/L (ref 3.5–5.1)
SODIUM: 138 mmol/L (ref 135–145)

## 2017-09-01 MED ORDER — FLUCONAZOLE IN SODIUM CHLORIDE 200-0.9 MG/100ML-% IV SOLN
200.0000 mg | Freq: Once | INTRAVENOUS | Status: AC
  Administered 2017-09-02: 200 mg via INTRAVENOUS
  Filled 2017-09-01: qty 100

## 2017-09-01 MED ORDER — ONDANSETRON HCL 4 MG/2ML IJ SOLN: 4.0000 mg | Freq: Once | INTRAMUSCULAR | Status: DC

## 2017-09-01 MED ORDER — FLUCONAZOLE 100MG IVPB
100.0000 mg | INTRAVENOUS | Status: DC
  Administered 2017-09-02: 100 mg via INTRAVENOUS
  Filled 2017-09-01: qty 50

## 2017-09-01 NOTE — Progress Notes (Signed)
Pharmacy Antibiotic Note  Vedha Mcjunkin is a 42 y.o. female admitted on 09/01/2017 with esophageal candidiasis.  Pharmacy has been consulted for fluconazole dosing. Pt her is with dysphagia. She has a h/o CKD on peritoneal HD. WBC wnl.   Plan: 1) Fluconazole 200 mg IV once, then start 100 mg q 24 hours Guadelupe Sabin et al. Am J Med 1989; 86: 825-827) 2) Monitor CBC and clinical progress 3) F/u nephrology plans  4) Consider transitioning to oral therapy when patient can swallow     Temp (24hrs), Avg:98.2 F (36.8 C), Min:98.2 F (36.8 C), Max:98.2 F (36.8 C)  Recent Labs  Lab 09/01/17 2009  WBC 8.3  CREATININE 15.18*    CrCl cannot be calculated (Unknown ideal weight.).    Allergies  Allergen Reactions  . Nsaids Other (See Comments)    Cannot take due to Kidney disease, Kidney disease, Kidney function   . Tolmetin Other (See Comments)    Cannot take due to Kidney Disease    Antimicrobials this admission: Fluconazole 8/28 >>   Dose adjustments this admission:   Microbiology results:  Thank you for allowing pharmacy to be a part of this patient's care.  Albertina Parr, PharmD., BCPS Clinical Pharmacist Clinical phone for 09/01/17: [phone]

## 2017-09-01 NOTE — ED Provider Notes (Signed)
[facility] EMERGENCY DEPARTMENT Provider Note   CSN: [id number] Arrival date & time: 09/01/17  1858     History   Chief Complaint Chief Complaint  Patient presents with  . Abnormal Lab    HPI Patricia [name] is a 42 y.o. female hx of esophageal candidiasis, high cholesterol, hypertension, ESRD on peritoneal dialysis here presenting with melena, weakness, shortness of breath, trouble swallowing.  Patient was admitted 2 weeks ago for esophageal candidiasis and finished a course of IV Diflucan and felt better.  Over the last week or so she has worsening shortness of breath and difficulty swallowing.  Patient also had some dark stools as well.  She states that he cannot tell if is darker than usual.  She had a hemoglobin drawn 2 days ago that was 7.2.  On discharge her hemoglobin was 8.2 on 8/15.   The history is provided by the patient.    Past Medical History:  Diagnosis Date  . Acid reflux   . Anxiety   . Arthritis   . Dysrhythmia    tachycardia  . ESRD (end stage renal disease) ([place])    TTHSAT- [place]  . Gout   . Headache(784.0)    "related to chemo; sometimes weekly" (09/12/2013)  . High cholesterol   . History of blood transfusion "a couple"   "related to low counts"  . Hypertension   . Iron deficiency anemia    "get epogen shots q month" (02/20/2014)  . 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 Type II diabetes mellitus ([place])    "took me off my RX ~ 04/2013"    Patient Active Problem List   Diagnosis Date Noted  . Esophageal candidiasis ([place]) 09/01/2017  . Stroke ([place]) 08/15/2017  . Dysphagia 08/15/2017  . Melena 06/03/2017  . Abdominal pain 06/03/2017  . Dialysis AV fistula malfunction ([place]) 04/04/2017  . ESRD (end stage renal disease) ([place]) 04/01/2016  . Anemia due to chronic kidney disease 04/01/2016  . GERD (gastroesophageal reflux disease) 04/01/2016  . Hypomagnesemia  04/28/2015  . Acute renal failure superimposed on stage 3 chronic kidney disease ([place]) 04/12/2014  . Atypical chest pain   . Chest pain 02/19/2014  . Hypokalemia 02/19/2014  . Nephrotic syndrome 10/02/2013  . Dyslipidemia 10/02/2013  . PAT (paroxysmal atrial tachycardia) ([place]) 09/29/2013  . Normocytic anemia 06/29/2013  . Edema 06/29/2013  . Nausea and vomiting 06/29/2013  . Fever, unspecified 01/31/2013  . Glomerulonephritis 01/28/2013  . Syncope 01/28/2013  . Shortness of breath 07/04/2011  . Morbid obesity ([place]) 07/04/2011  . HTN (hypertension) 07/04/2011  . DM (diabetes mellitus) ([employer]) 07/04/2011  . Hypercholesterolemia 07/04/2011    Past Surgical History:  Procedure Laterality Date  . ABDOMINAL HYSTERECTOMY  2010   "laparoscopic"  . ANKLE FRACTURE SURGERY Right 1994  . AV FISTULA PLACEMENT Left 04/14/2016   Procedure: ARTERIOVENOUS (AV) FISTULA CREATION;  Surgeon: Angelia Mould, MD;  Location[facility] OR;  Service: Vascular;  Laterality: Left;  . AV FISTULA PLACEMENT Left 08/09/2017   Procedure: INSERTION OF ARTERIOVENOUS (AV) GORE-TEX GRAFT LEFT ARM;  Surgeon: Elam Dutch, MD;  Location: [facility];  Service: Vascular;  Laterality: Left;  . [place] REMOVAL Left 08/11/2017   Procedure: REMOVAL OF ARTERIOVENOUS GORETEX GRAFT ([place]);  Surgeon: Serafina Mitchell, MD;  Location: [facility] OR;  Service: Vascular;  Laterality: Left;  . CARDIAC CATHETERIZATION  2000's  . ESOPHAGOGASTRODUODENOSCOPY    . ESOPHAGOGASTRODUODENOSCOPY (  EGD) WITH PROPOFOL Left 06/04/2017   Procedure: ESOPHAGOGASTRODUODENOSCOPY (EGD) WITH PROPOFOL;  Surgeon: Arta Silence, MD;  Location: [facility];  Service: Endoscopy;  Laterality: Left;  . FISTULA SUPERFICIALIZATION Left 04/02/2017   Procedure: FISTULA SUPERFICIALIZATION LEFT ARM;  Surgeon: Serafina Mitchell, MD;  Location: [facility];  Service: Vascular;  Laterality: Left;  . FISTULOGRAM Left 04/07/2016   Procedure: FISTULOGRAM;  Surgeon: Waynetta Sandy, MD;   Location: [facility];  Service: Vascular;  Laterality: Left;  . FRACTURE SURGERY     right ankle  . INSERTION OF DIALYSIS CATHETER Right 04/07/2016   Procedure: INSERTION OF DIALYSIS CATHETER;  Surgeon: Waynetta Sandy, MD;  Location: [facility];  Service: Vascular;  Laterality: Right;  . INSERTION OF DIALYSIS CATHETER Right 04/04/2017   Procedure: INSERTION OF DIALYSIS CATHETER;  Surgeon: Waynetta Sandy, MD;  Location: [facility];  Service: Vascular;  Laterality: Right;  . LIGATION OF ARTERIOVENOUS  FISTULA Left 04/14/2016   Procedure: LIGATION OF ARTERIOVENOUS  FISTULA;  Surgeon: Angelia Mould, MD;  Location: [facility];  Service: Vascular;  Laterality: Left;  . PATCH ANGIOPLASTY Left 06/19/2016   Procedure: PATCH ANGIOPLASTY LEFT ARTERIOVENOUS FISTULA;  Surgeon: Angelia Mould, MD;  Location: [facility];  Service: Vascular;  Laterality: Left;  . REVISON OF ARTERIOVENOUS FISTULA Left 06/19/2016   Procedure: REVISION SUPERFICIALIZATION OF BRACHIOCEPHALIC ARTERIOVENOUS FISTULA;  Surgeon: Angelia Mould, MD;  Location[facility] OR;  Service: Vascular;  Laterality: Left;     OB History    Gravida  3   Para      Term      Preterm      AB      Living        SAB      TAB      Ectopic      Multiple  3   Live Births               Home Medications    Prior to Admission medications   Medication Sig Start Date End Date Taking? Authorizing Provider  allopurinol (ZYLOPRIM) 100 MG tablet Take 100 mg by mouth at bedtime.    Yes [provider]  ALPRAZolam Duanne Moron) 1 MG tablet Take 1 mg by mouth 2 (two) times daily as needed for anxiety.  03/19/17  Yes [provider]  aspirin 81 MG chewable tablet Chew 1 tablet (81 mg total) by mouth daily. Patient taking differently: Chew 81 mg by mouth at bedtime.  10/03/13  Yes Eugenie Filler, MD  atorvastatin (LIPITOR) 40 MG tablet Take 40 mg by mouth at bedtime.  03/14/14  Yes [provider]  calcitRIOL  (ROCALTROL) 0.5 MCG capsule Take 1 capsule (0.5 mcg total) by mouth daily with breakfast. Patient taking differently: Take 0.5 mcg by mouth at bedtime.  06/04/17  Yes Bonnielee Haff, MD  cinacalcet (SENSIPAR) 30 MG tablet Take 1 tablet (30 mg total) by mouth daily. 08/19/17  Yes Adhikari, Tamsen Meek, MD  diphenhydramine-acetaminophen (TYLENOL PM) 25-500 MG TABS tablet Take 2 tablets by mouth at bedtime.   Yes [provider]  fluconazole (DIFLUCAN) 200 MG tablet Take 1 tablet (200 mg total) by mouth daily for 16 days. 08/20/17 09/05/17 Yes Shelly Coss, MD  fluticasone (FLONASE) 50 MCG/ACT nasal spray Place 1 spray into both nostrils daily as needed for allergies or rhinitis.   Yes [provider]  gentamicin cream (GARAMYCIN) 0.1 % Apply 1 application topically daily.   Yes [provider]  lisinopril (  PRINIVIL,ZESTRIL) 20 MG tablet Take 20 mg by mouth at bedtime.    Yes [provider]  metoprolol tartrate (LOPRESSOR) 25 MG tablet Take 25 mg by mouth daily as needed (for high BP > 140).  04/17/16  Yes [provider]  multivitamin (RENA-VIT) TABS tablet Take 1 tablet by mouth at bedtime.    Yes [provider]  mupirocin ointment (BACTROBAN) 2 % Apply 1 application topically 3 (three) times daily. 08/27/17  Yes [provider]  omeprazole (PRILOSEC) 40 MG capsule Take 40 mg by mouth daily.   Yes [provider]  ondansetron (ZOFRAN) 8 MG tablet Take 8 mg by mouth every 8 (eight) hours as needed for nausea or vomiting. 07/20/17  Yes [provider]  pantoprazole (PROTONIX) 40 MG tablet Take 1 tablet (40 mg total) by mouth 2 (two) times daily before a meal. Patient taking differently: Take 40 mg by mouth daily.  06/04/17  Yes Bonnielee Haff, MD  promethazine (PHENERGAN) 12.5 MG tablet Take 12.5 mg by mouth every 6 (six) hours as needed for nausea or vomiting. 07/20/17  Yes [provider]  senna (SENOKOT) 8.6 MG TABS  tablet Take 2 tablets (17.2 mg total) by mouth at bedtime. 04/15/16  Yes Nita Sells, MD  sucroferric oxyhydroxide (VELPHORO) 500 MG chewable tablet Chew 500 mg by mouth 3 (three) times daily with meals.   Yes [provider]  venlafaxine XR (EFFEXOR-XR) 75 MG 24 hr capsule Take 75 mg by mouth at bedtime.    Yes [provider]  oxyCODONE-acetaminophen (PERCOCET/ROXICET) 5-325 MG tablet Take 1-2 tablets by mouth every 6 (six) hours as needed for severe pain. Patient not taking: Reported on 09/01/2017 08/09/17   Elam Dutch, MD    Family History Family History  Problem Relation Age of Onset  . Hypertension Mother   . Thyroid disease Mother   . Coronary artery disease Father   . Hypertension Father   . Diabetes Father     Social History Social History   Tobacco Use  . Smoking status: Never Smoker  . Smokeless tobacco: Never Used  Substance Use Topics  . Alcohol use: No  . Drug use: No     Allergies   Nsaids and Tolmetin   Review of Systems Review of Systems  Respiratory: Positive for shortness of breath.   Gastrointestinal:       Melena   All other systems reviewed and are negative.    Physical Exam Updated Vital Signs BP 134/74   Pulse 67   Temp 98.2 F (36.8 C) (Oral)   Resp (!) 21   SpO2 98%   Physical Exam  Constitutional: She is oriented to person, place, and time.  Chronically ill   HENT:  Head: Normocephalic.  Eyes: Pupils are equal, round, and reactive to light. Conjunctivae and EOM are normal.  Neck: Normal range of motion. Neck supple.  Cardiovascular: Normal rate, regular rhythm and normal heart sounds.  Pulmonary/Chest: Effort normal and breath sounds normal. No stridor. No respiratory distress.  Abdominal: Soft. Bowel sounds are normal. She exhibits no distension. There is no tenderness.  Musculoskeletal: Normal range of motion.  Neurological: She is alert and oriented to person, place, and time. No cranial nerve  deficit. Coordination normal.  Skin: Skin is warm. Capillary refill takes less than 2 seconds.  Psychiatric: She has a normal mood and affect.  Nursing note and vitals reviewed.    ED Treatments / Results  Labs (all labs ordered are  listed, but only abnormal results are displayed) Labs Reviewed  CBC - Abnormal; Notable for the following components:      Result Value   RBC 2.99 (*)    Hemoglobin 7.9 (*)    HCT 25.7 (*)    RDW 16.4 (*)    All other components within normal limits  BASIC METABOLIC PANEL - Abnormal; Notable for the following components:   Chloride 95 (*)    BUN 49 (*)    Creatinine, Ser 15.18 (*)    Calcium 7.9 (*)    GFR calc non Af Amer 3 (*)    GFR calc Af Amer 3 (*)    Anion gap 19 (*)    All other components within normal limits  POC OCCULT BLOOD, ED - Abnormal; Notable for the following components:   Fecal Occult Bld POSITIVE (*)    All other components within normal limits  TYPE AND SCREEN    EKG EKG Interpretation  Date/Time:  Wednesday September 01 2017 20:04:06 EDT Ventricular Rate:  74 PR Interval:  154 QRS Duration: 90 QT Interval:  426 QTC Calculation: 472 R Axis:   43 Text Interpretation:  Normal sinus rhythm Cannot rule out Anterior infarct , age undetermined Abnormal ECG No significant change since last tracing Confirmed by Wandra Arthurs ([id number]) on 09/01/2017 9:38:46 PM   Radiology Dg Chest 2 View  Result Date: 09/01/2017 CLINICAL DATA:  Fatigue, dizziness, shortness of breath EXAM: CHEST - 2 VIEW COMPARISON:  08/14/2017 FINDINGS: Mild cardiomegaly. Low lung volumes. No confluent opacities or effusions. No acute bony abnormality. Lucency noted adjacent to the right hemidiaphragm. Cannot exclude small amount of free air under the right hemidiaphragm. Recommend correlation for abdominal pain. IMPRESSION: No acute cardiopulmonary disease.  Low lung volumes. Questionable pneumoperitoneum under the right hemidiaphragm. Recommend clinical  correlation. Electronically Signed   By: Rolm Baptise M.D.   On: 09/01/2017 22:41    Procedures Procedures (including critical care time)  Medications Ordered in ED Medications  ondansetron (ZOFRAN) injection 4 mg (has no administration in time range)  fluconazole (DIFLUCAN) IVPB 200 mg (has no administration in time range)  fluconazole (DIFLUCAN) IVPB 100 mg (has no administration in time range)     Initial Impression / Assessment and Plan / ED Course  I have reviewed the triage vital signs and the nursing notes.  Pertinent labs & imaging results that were available during my care of the patient were reviewed by me and considered in my medical decision making (see chart for details).     Areil Heatley is a 42 y.o. female here with dizziness, anemia. Upon review of records, GI thinks that patient likely has esophageal candidiasis causing her symptoms. She has melena currently. Will recheck cbc, chemistry.   11:29 PM Hg 7.9, up from 7.2. However, Guiac positive and still nauseated. I called Dr. Paulita Fujita regarding patient. Will start on IV fluconazole for possible esophageal candidiasis. Hospitalist to admit.     Final Clinical Impressions(s) / ED Diagnoses   Final diagnoses:  None    ED Discharge Orders    None       Drenda Freeze, MD 09/01/17 2329

## 2017-09-01 NOTE — ED Triage Notes (Signed)
Pt states that since Sunday she has been feeling fatigue, dizzy, some SOB, had lab work done on Monday with her dialysis center and told her Hbg was 7.2.

## 2017-09-02 ENCOUNTER — Other Ambulatory Visit: Payer: Self-pay

## 2017-09-02 ENCOUNTER — Encounter (HOSPITAL_COMMUNITY): Payer: Self-pay

## 2017-09-02 DIAGNOSIS — D649 Anemia, unspecified: Secondary | ICD-10-CM | POA: Diagnosis not present

## 2017-09-02 DIAGNOSIS — N186 End stage renal disease: Secondary | ICD-10-CM | POA: Diagnosis not present

## 2017-09-02 DIAGNOSIS — K921 Melena: Secondary | ICD-10-CM | POA: Diagnosis not present

## 2017-09-02 DIAGNOSIS — B3781 Candidal esophagitis: Secondary | ICD-10-CM | POA: Diagnosis not present

## 2017-09-02 LAB — CBC
HCT: 21.6 % — ABNORMAL LOW (ref 36.0–46.0)
Hemoglobin: 6.8 g/dL — CL (ref 12.0–15.0)
MCH: 26.6 pg (ref 26.0–34.0)
MCHC: 31.5 g/dL (ref 30.0–36.0)
MCV: 84.4 fL (ref 78.0–100.0)
Platelets: 236 10*3/uL (ref 150–400)
RBC: 2.56 MIL/uL — AB (ref 3.87–5.11)
RDW: 16 % — ABNORMAL HIGH (ref 11.5–15.5)
WBC: 6.7 10*3/uL (ref 4.0–10.5)

## 2017-09-02 LAB — HEMOGLOBIN AND HEMATOCRIT, BLOOD
HCT: 26.1 % — ABNORMAL LOW (ref 36.0–46.0)
HEMOGLOBIN: 8.5 g/dL — AB (ref 12.0–15.0)

## 2017-09-02 LAB — BASIC METABOLIC PANEL
ANION GAP: 16 — AB (ref 5–15)
BUN: 57 mg/dL — ABNORMAL HIGH (ref 6–20)
CO2: 24 mmol/L (ref 22–32)
Calcium: 7.3 mg/dL — ABNORMAL LOW (ref 8.9–10.3)
Chloride: 96 mmol/L — ABNORMAL LOW (ref 98–111)
Creatinine, Ser: 16.34 mg/dL — ABNORMAL HIGH (ref 0.44–1.00)
GFR calc Af Amer: 3 mL/min — ABNORMAL LOW (ref >60)
GFR calc non Af Amer: 2 mL/min — ABNORMAL LOW (ref >60)
GLUCOSE: 85 mg/dL (ref 70–99)
Potassium: 4 mmol/L (ref 3.5–5.1)
Sodium: 136 mmol/L (ref 135–145)

## 2017-09-02 LAB — PREPARE RBC (CROSSMATCH)

## 2017-09-02 MED ORDER — PROMETHAZINE HCL 25 MG PO TABS
12.5000 mg | ORAL_TABLET | Freq: Four times a day (QID) | ORAL | Status: DC | PRN
  Administered 2017-09-02 (×3): 12.5 mg via ORAL
  Filled 2017-09-02 (×3): qty 1

## 2017-09-02 MED ORDER — SODIUM CHLORIDE 0.9% FLUSH
3.0000 mL | Freq: Two times a day (BID) | INTRAVENOUS | Status: DC
  Administered 2017-09-02 – 2017-09-03 (×5): 3 mL via INTRAVENOUS

## 2017-09-02 MED ORDER — GENTAMICIN SULFATE 0.1 % EX CREA
1.0000 "application " | TOPICAL_CREAM | Freq: Every day | CUTANEOUS | Status: DC
  Filled 2017-09-02: qty 15

## 2017-09-02 MED ORDER — DARBEPOETIN ALFA 200 MCG/0.4ML IJ SOSY
200.0000 ug | PREFILLED_SYRINGE | Freq: Once | INTRAMUSCULAR | Status: AC
  Administered 2017-09-02: 200 ug via INTRAVENOUS
  Filled 2017-09-02: qty 0.4

## 2017-09-02 MED ORDER — LORAZEPAM 2 MG/ML IJ SOLN
0.5000 mg | Freq: Four times a day (QID) | INTRAMUSCULAR | Status: DC | PRN
  Administered 2017-09-02 – 2017-09-03 (×3): 0.5 mg via INTRAVENOUS
  Filled 2017-09-02 (×3): qty 1

## 2017-09-02 MED ORDER — GENTAMICIN SULFATE 0.1 % EX CREA
1.0000 "application " | TOPICAL_CREAM | Freq: Every day | CUTANEOUS | Status: DC
  Administered 2017-09-02 – 2017-09-04 (×3): 1 via TOPICAL
  Filled 2017-09-02: qty 15

## 2017-09-02 MED ORDER — SODIUM CHLORIDE 0.9% FLUSH: 3.0000 mL | INTRAVENOUS | Status: DC | PRN

## 2017-09-02 MED ORDER — ACETAMINOPHEN 325 MG PO TABS
650.0000 mg | ORAL_TABLET | Freq: Four times a day (QID) | ORAL | Status: DC | PRN
  Administered 2017-09-03 – 2017-09-04 (×2): 650 mg via ORAL
  Filled 2017-09-02 (×2): qty 2

## 2017-09-02 MED ORDER — ACETAMINOPHEN 650 MG RE SUPP: 650.0000 mg | Freq: Four times a day (QID) | RECTAL | Status: DC | PRN

## 2017-09-02 MED ORDER — DARBEPOETIN ALFA 60 MCG/0.3ML IJ SOSY
60.0000 ug | PREFILLED_SYRINGE | Freq: Once | INTRAMUSCULAR | Status: DC
  Filled 2017-09-02: qty 0.3

## 2017-09-02 MED ORDER — PANTOPRAZOLE SODIUM 40 MG IV SOLR
40.0000 mg | Freq: Two times a day (BID) | INTRAVENOUS | Status: DC
  Administered 2017-09-02 – 2017-09-03 (×5): 40 mg via INTRAVENOUS
  Filled 2017-09-02 (×6): qty 40

## 2017-09-02 MED ORDER — HYDRALAZINE HCL 20 MG/ML IJ SOLN: 10.0000 mg | INTRAMUSCULAR | Status: DC | PRN

## 2017-09-02 MED ORDER — SODIUM CHLORIDE 0.9 % IV SOLN: 250.0000 mL | INTRAVENOUS | Status: DC | PRN

## 2017-09-02 MED ORDER — HEPARIN 1000 UNIT/ML FOR PERITONEAL DIALYSIS: 500.0000 [IU] | INTRAMUSCULAR | Status: DC | PRN

## 2017-09-02 MED ORDER — SODIUM CHLORIDE 0.9% IV SOLUTION
Freq: Once | INTRAVENOUS | Status: AC
  Administered 2017-09-02: 08:00:00 via INTRAVENOUS

## 2017-09-02 NOTE — Progress Notes (Signed)
Pt still unable to provide stool sample at this time

## 2017-09-02 NOTE — H&P (Signed)
History and Physical    [name] UJW:[id number] DOB: July 11, 1975 DOA: 09/01/2017  PCP: Blair Heys, PA-C   Patient coming from: Home   Chief Complaint: Dysphagia, dark stool, low Hgb on outpatient labs   HPI: Patricia [name] is a 42 y.o. female with medical history significant for end-stage renal disease on peritoneal dialysis, anxiety, hypertension, and recent admission with dysphasia attributed to esophageal candidiasis and treated with fluconazole, now returning to the emergency department for evaluation of recurrent dysphasia, dark stools, and low hemoglobin on outpatient blood work.  Patient reports some increased fatigue over the past couple weeks, mention this to staff at her dialysis center 2 days ago, had blood work performed, and was notified today that her hemoglobin was low and that ED visit was recommended.  Patient reports that her dysphasia has improved significantly with fluconazole during the recent admission, but has recently returned.  She describes pain with swallowing liquids or solids.  She reports that stool has been dark but she had attributed this to her medications.  She denies any chest pain, cough, fevers, or chills.  No bright red blood per rectum.  ED Course: Upon arrival to the ED, patient is found to be afebrile, saturating well on room air, and with vitals otherwise normal.  EKG features normal sinus rhythm and chest x-ray is negative for acute cardiopulmonary disease.  Chemistry panel is notable for BUN of 49, normal potassium, and normal bicarbonate.  CBC features a normocytic anemia with hemoglobin of 7.9, down from 8.2 on 08/17/2017.  Fecal occult blood testing is positive.  Type and screen was performed and gastroenterology was consulted by the ED physician, recommending resumption of Diflucan, hospitalist admission, and indicating that we will plan to see the patient in the hospital.  Patient remains hemodynamically stable, and no apparent respiratory  distress, and will be observed on the medical-surgical unit for ongoing evaluation and management.  Review of Systems:  All other systems reviewed and apart from HPI, are negative.  Past Medical History:  Diagnosis Date  . Acid reflux   . Anxiety   . Arthritis   . Dysrhythmia    tachycardia  . ESRD (end stage renal disease) ([place])    TTHSAT- [place]  . Gout   . Headache(784.0)    "related to chemo; sometimes weekly" (09/12/2013)  . High cholesterol   . History of blood transfusion "a couple"   "related to low counts"  . Hypertension   . Iron deficiency anemia    "get epogen shots q month" (02/20/2014)  . 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 Type II diabetes mellitus ([place])    "took me off my RX ~ 04/2013"    Past Surgical History:  Procedure Laterality Date  . ABDOMINAL HYSTERECTOMY  2010   "laparoscopic"  . ANKLE FRACTURE SURGERY Right 1994  . AV FISTULA PLACEMENT Left 04/14/2016   Procedure: ARTERIOVENOUS (AV) FISTULA CREATION;  Surgeon: Angelia Mould, MD;  Location[facility] OR;  Service: Vascular;  Laterality: Left;  . AV FISTULA PLACEMENT Left 08/09/2017   Procedure: INSERTION OF ARTERIOVENOUS (AV) GORE-TEX GRAFT LEFT ARM;  Surgeon: Elam Dutch, MD;  Location: [place];  Service: Vascular;  Laterality: Left;  . [place] REMOVAL Left 08/11/2017   Procedure: REMOVAL OF ARTERIOVENOUS GORETEX GRAFT ([place]);  Surgeon: Serafina Mitchell, MD;  Location[facility];  Service: Vascular;  Laterality: Left;  . CARDIAC CATHETERIZATION  2000's  .  ESOPHAGOGASTRODUODENOSCOPY    . ESOPHAGOGASTRODUODENOSCOPY (EGD) WITH PROPOFOL Left 06/04/2017   Procedure: ESOPHAGOGASTRODUODENOSCOPY (EGD) WITH PROPOFOL;  Surgeon: Arta Silence, MD;  Location: [place];  Service: Endoscopy;  Laterality: Left;  . FISTULA SUPERFICIALIZATION Left 04/02/2017   Procedure: FISTULA SUPERFICIALIZATION LEFT ARM;  Surgeon: Serafina Mitchell, MD;  Location: [place];  Service: Vascular;  Laterality: Left;  . FISTULOGRAM Left 04/07/2016   Procedure: FISTULOGRAM;  Surgeon: Waynetta Sandy, MD;  Location: [place];  Service: Vascular;  Laterality: Left;  . FRACTURE SURGERY     right ankle  . INSERTION OF DIALYSIS CATHETER Right 04/07/2016   Procedure: INSERTION OF DIALYSIS CATHETER;  Surgeon: Waynetta Sandy, MD;  Location: [place];  Service: Vascular;  Laterality: Right;  . INSERTION OF DIALYSIS CATHETER Right 04/04/2017   Procedure: INSERTION OF DIALYSIS CATHETER;  Surgeon: Waynetta Sandy, MD;  Location: [place];  Service: Vascular;  Laterality: Right;  . LIGATION OF ARTERIOVENOUS  FISTULA Left 04/14/2016   Procedure: LIGATION OF ARTERIOVENOUS  FISTULA;  Surgeon: Angelia Mould, MD;  Location: [place];  Service: Vascular;  Laterality: Left;  . PATCH ANGIOPLASTY Left 06/19/2016   Procedure: PATCH ANGIOPLASTY LEFT ARTERIOVENOUS FISTULA;  Surgeon: Angelia Mould, MD;  Location: [place];  Service: Vascular;  Laterality: Left;  . REVISON OF ARTERIOVENOUS FISTULA Left 06/19/2016   Procedure: REVISION SUPERFICIALIZATION OF BRACHIOCEPHALIC ARTERIOVENOUS FISTULA;  Surgeon: Angelia Mould, MD;  Location: [place];  Service: Vascular;  Laterality: Left;     reports that she has never smoked. She has never used smokeless tobacco. She reports that she does not drink alcohol or use drugs.  Allergies  Allergen Reactions  . Nsaids Other (See Comments)    Cannot take due to Kidney disease, Kidney disease, Kidney function   . Tolmetin Other (See Comments)    Cannot take due to Kidney Disease    Family History  Problem Relation Age of Onset  . Hypertension Mother   . Thyroid disease Mother   . Coronary artery disease Father   . Hypertension Father   . Diabetes Father      Prior to Admission medications   Medication Sig Start Date End Date Taking? Authorizing Provider  allopurinol (ZYLOPRIM) 100 MG tablet Take 100 mg by mouth  at bedtime.    Yes [provider]  ALPRAZolam Duanne Moron) 1 MG tablet Take 1 mg by mouth 2 (two) times daily as needed for anxiety.  03/19/17  Yes [provider]  aspirin 81 MG chewable tablet Chew 1 tablet (81 mg total) by mouth daily. Patient taking differently: Chew 81 mg by mouth at bedtime.  10/03/13  Yes Eugenie Filler, MD  atorvastatin (LIPITOR) 40 MG tablet Take 40 mg by mouth at bedtime.  03/14/14  Yes [provider]  calcitRIOL (ROCALTROL) 0.5 MCG capsule Take 1 capsule (0.5 mcg total) by mouth daily with breakfast. Patient taking differently: Take 0.5 mcg by mouth at bedtime.  06/04/17  Yes Bonnielee Haff, MD  cinacalcet (SENSIPAR) 30 MG tablet Take 1 tablet (30 mg total) by mouth daily. 08/19/17  Yes Adhikari, Tamsen Meek, MD  diphenhydramine-acetaminophen (TYLENOL PM) 25-500 MG TABS tablet Take 2 tablets by mouth at bedtime.   Yes [provider]  fluconazole (DIFLUCAN) 200 MG tablet Take 1 tablet (200 mg total) by mouth daily for 16 days. 08/20/17 09/05/17 Yes Shelly Coss, MD  fluticasone (FLONASE) 50 MCG/ACT nasal spray Place 1 spray into both nostrils daily as needed for allergies or  rhinitis.   Yes [provider]  gentamicin cream (GARAMYCIN) 0.1 % Apply 1 application topically daily.   Yes [provider]  lisinopril (PRINIVIL,ZESTRIL) 20 MG tablet Take 20 mg by mouth at bedtime.    Yes [provider]  metoprolol tartrate (LOPRESSOR) 25 MG tablet Take 25 mg by mouth daily as needed (for high BP > 140).  04/17/16  Yes [provider]  multivitamin (RENA-VIT) TABS tablet Take 1 tablet by mouth at bedtime.    Yes [provider]  mupirocin ointment (BACTROBAN) 2 % Apply 1 application topically 3 (three) times daily. 08/27/17  Yes [provider]  omeprazole (PRILOSEC) 40 MG capsule Take 40 mg by mouth daily.   Yes [provider]  ondansetron (ZOFRAN) 8 MG tablet Take 8 mg by mouth every 8  (eight) hours as needed for nausea or vomiting. 07/20/17  Yes [provider]  pantoprazole (PROTONIX) 40 MG tablet Take 1 tablet (40 mg total) by mouth 2 (two) times daily before a meal. Patient taking differently: Take 40 mg by mouth daily.  06/04/17  Yes Bonnielee Haff, MD  promethazine (PHENERGAN) 12.5 MG tablet Take 12.5 mg by mouth every 6 (six) hours as needed for nausea or vomiting. 07/20/17  Yes [provider]  senna (SENOKOT) 8.6 MG TABS tablet Take 2 tablets (17.2 mg total) by mouth at bedtime. 04/15/16  Yes Nita Sells, MD  sucroferric oxyhydroxide (VELPHORO) 500 MG chewable tablet Chew 500 mg by mouth 3 (three) times daily with meals.   Yes [provider]  venlafaxine XR (EFFEXOR-XR) 75 MG 24 hr capsule Take 75 mg by mouth at bedtime.    Yes [provider]  oxyCODONE-acetaminophen (PERCOCET/ROXICET) 5-325 MG tablet Take 1-2 tablets by mouth every 6 (six) hours as needed for severe pain. Patient not taking: Reported on 09/01/2017 08/09/17   Elam Dutch, MD    Physical Exam: Vitals:   09/01/17 2130 09/01/17 2200 09/01/17 2222 09/01/17 2230  BP: 136/72 (!) 141/85 (!) 141/81 134/74  Pulse:   67   Resp: (!) 21 (!) 23 14 (!) 21  Temp:      TempSrc:      SpO2:   98%       Constitutional: NAD, calm  Eyes: PERTLA, lids and conjunctivae normal ENMT: Mucous membranes are moist. Posterior pharynx clear of any exudate or lesions.   Neck: normal, supple, no masses, no thyromegaly Respiratory: clear to auscultation bilaterally, no wheezing, no crackles. Normal respiratory effort.   Cardiovascular: S1 & S2 heard, regular rate and rhythm. No extremity edema.   Abdomen: No distension, no tenderness, soft. Bowel sounds normal.  Musculoskeletal: no clubbing / cyanosis. No joint deformity upper and lower extremities.  Skin: no significant rashes, lesions, ulcers. Warm, dry, well-perfused. Neurologic: No facial asymmetry. Sensation intact.  Strength 5/5 in all 4 limbs.  Psychiatric: Alert and oriented x 3. Pleasant and cooperative.     Labs on Admission: I have personally reviewed following labs and imaging studies  CBC: Recent Labs  Lab 09/01/17 2009  WBC 8.3  HGB 7.9*  HCT 25.7*  MCV 86.0  PLT 235   Basic Metabolic Panel: Recent Labs  Lab 09/01/17 2009  NA 138  K 4.1  CL 95*  CO2 24  GLUCOSE 99  BUN 49*  CREATININE 15.18*  CALCIUM 7.9*   GFR: CrCl cannot be calculated (Unknown ideal weight.). Liver Function Tests: No results for input(s): AST, ALT, ALKPHOS, BILITOT, PROT, ALBUMIN in the  last 168 hours. No results for input(s): LIPASE, AMYLASE in the last 168 hours. No results for input(s): AMMONIA in the last 168 hours. Coagulation Profile: No results for input(s): INR, PROTIME in the last 168 hours. Cardiac Enzymes: No results for input(s): CKTOTAL, CKMB, CKMBINDEX, TROPONINI in the last 168 hours. BNP (last 3 results) No results for input(s): PROBNP in the last 8760 hours. HbA1C: No results for input(s): HGBA1C in the last 72 hours. CBG: No results for input(s): GLUCAP in the last 168 hours. Lipid Profile: No results for input(s): CHOL, HDL, LDLCALC, TRIG, CHOLHDL, LDLDIRECT in the last 72 hours. Thyroid Function Tests: No results for input(s): TSH, T4TOTAL, FREET4, T3FREE, THYROIDAB in the last 72 hours. Anemia Panel: No results for input(s): VITAMINB12, FOLATE, FERRITIN, TIBC, IRON, RETICCTPCT in the last 72 hours. Urine analysis:    Component Value Date/Time   COLORURINE YELLOW 04/03/2016 1626   APPEARANCEUR HAZY (A) 04/03/2016 1626   LABSPEC 1.012 04/03/2016 1626   PHURINE 6.0 04/03/2016 1626   GLUCOSEU 150 (A) 04/03/2016 1626   HGBUR NEGATIVE 04/03/2016 1626   BILIRUBINUR NEGATIVE 04/03/2016 1626   KETONESUR NEGATIVE 04/03/2016 1626   PROTEINUR >=300 (A) 04/03/2016 1626   UROBILINOGEN 0.2 09/19/2014 1620   NITRITE NEGATIVE 04/03/2016 1626   LEUKOCYTESUR NEGATIVE 04/03/2016 1626     Sepsis Labs: $[email or web address]$ (procalcitonin:4,lacticidven:4) )No results found for this or any previous visit (from the past 240 hour(s)).   Radiological Exams on Admission: Dg Chest 2 View  Result Date: 09/01/2017 CLINICAL DATA:  Fatigue, dizziness, shortness of breath EXAM: CHEST - 2 VIEW COMPARISON:  08/14/2017 FINDINGS: Mild cardiomegaly. Low lung volumes. No confluent opacities or effusions. No acute bony abnormality. Lucency noted adjacent to the right hemidiaphragm. Cannot exclude small amount of free air under the right hemidiaphragm. Recommend correlation for abdominal pain. IMPRESSION: No acute cardiopulmonary disease.  Low lung volumes. Questionable pneumoperitoneum under the right hemidiaphragm. Recommend clinical correlation. Electronically Signed   By: Rolm Baptise M.D.   On: 09/01/2017 22:41    EKG: Independently reviewed. Normal sinus rhythm.   Assessment/Plan   1. Melena; normocytic anemia  - Presents with recurrent dysphagia, dark stool, and low Hgb on outpatient labs  - Hgb is 7.9 on admission, only slightly down from 8.2 on 08/17/17  - FOBT is positive  - She is hemodynamically stable  - GI is consulting and much appreciated  - Type and screen, hold ASA, repeat CBC in am    2. Esophageal candidiasis  - Admitted two weeks ago with dysphagia attributed to esophageal candidiasis, improved with fluconazole, but sxs now returned  - ED physician discussed with GI and resumption of IV fluconazole recommended    3. ESRD  - Does PD at home  - No hyperkalemia, uremia, acidosis, significant hypervolemia, or hypertension on admission  - SLIV, renally-dose medications   4. Hypertension  - BP at goal, use hydralazine IVP's prn while NPO    5. Anxiety  - Stable, use Ativan prn while NPO    DVT prophylaxis: SCD's  Code Status: Full  Family Communication: Discussed with patient  Consults called: Gastroenterology  Admission status: Observation     Vianne Bulls, MD [facility] Pager [phone]  If 7PM-7AM, please contact night-coverage [email or web address] Password [email or web address]  09/02/2017, 12:02 AM

## 2017-09-02 NOTE — H&P (View-Only) (Signed)
Referring Provider: [facility] Primary Care Physician:  Blair Heys, PA-C Primary Gastroenterologist:  [facility]   Reason for Consultation:  Melena   HPI: Patricia [name] is a 42 y.o. female with past medical history of end-stage renal disease on peritoneal dialysis, history of chronic anemia, history of GERD admitted to hospital for further evaluation of Stool dysphagia and drop in hemoglobin. She underwent EGD on 06/04/2017 by Dr. Paulita Fujita for evaluation of melena which showed mild gastritis and duodenitis.she was recently discharged from hospital on 08/19/2017. At that time she was treated with Diflucan for possible Candida esophagitis.  she started noticing dark stools after discharge. Subsequently she started having fatigue and weakness. Her pain while swallowing has improved. Complaining of intermittent dysphagia to certain foods which is chronic. She denies any bright red blood per rectum. Denies diarrhea or constipation.she denies any abdominal pain but complaining of intermittent abdominal cramps.  History of colon cancer in grandmother. No previous colonoscopy  Past Medical History:  Diagnosis Date  . Acid reflux   . Anxiety   . Arthritis   . Dyspnea   . Dysrhythmia    tachycardia  . ESRD (end stage renal disease) ([place])    [facility]SAT- [place]  . Gout   . Headache(784.0)    "related to chemo; sometimes weekly" (09/12/2013)  . High cholesterol   . History of blood transfusion "a couple"   "related to low counts"  . Hypertension   . Iron deficiency anemia    "get epogen shots q month" (02/20/2014)  . MCGN (minimal change glomerulonephritis)    "using chemo to tx;  finished my last tx in 12/2013"    Past Surgical History:  Procedure Laterality Date  . ABDOMINAL HYSTERECTOMY  2010   "laparoscopic"  . ANKLE FRACTURE SURGERY Right 1994  . AV FISTULA PLACEMENT Left 04/14/2016   Procedure: ARTERIOVENOUS (AV) FISTULA CREATION;  Surgeon: Angelia Mould, MD;  Location: [facility] OR;   Service: Vascular;  Laterality: Left;  . AV FISTULA PLACEMENT Left 08/09/2017   Procedure: INSERTION OF ARTERIOVENOUS (AV) GORE-TEX GRAFT LEFT ARM;  Surgeon: Elam Dutch, MD;  Location: [place];  Service: Vascular;  Laterality: Left;  . [place] REMOVAL Left 08/11/2017   Procedure: REMOVAL OF ARTERIOVENOUS GORETEX GRAFT ([place]);  Surgeon: Serafina Mitchell, MD;  Location: [facility] OR;  Service: Vascular;  Laterality: Left;  . CARDIAC CATHETERIZATION  2000's  . ESOPHAGOGASTRODUODENOSCOPY    . ESOPHAGOGASTRODUODENOSCOPY (EGD) WITH PROPOFOL Left 06/04/2017   Procedure: ESOPHAGOGASTRODUODENOSCOPY (EGD) WITH PROPOFOL;  Surgeon: Arta Silence, MD;  Location: [place];  Service: Endoscopy;  Laterality: Left;  . FISTULA SUPERFICIALIZATION Left 04/02/2017   Procedure: FISTULA SUPERFICIALIZATION LEFT ARM;  Surgeon: Serafina Mitchell, MD;  Location: [place];  Service: Vascular;  Laterality: Left;  . FISTULOGRAM Left 04/07/2016   Procedure: FISTULOGRAM;  Surgeon: Waynetta Sandy, MD;  Location: [place];  Service: Vascular;  Laterality: Left;  . FRACTURE SURGERY     right ankle  . INSERTION OF DIALYSIS CATHETER Right 04/07/2016   Procedure: INSERTION OF DIALYSIS CATHETER;  Surgeon: Waynetta Sandy, MD;  Location: [place];  Service: Vascular;  Laterality: Right;  . INSERTION OF DIALYSIS CATHETER Right 04/04/2017   Procedure: INSERTION OF DIALYSIS CATHETER;  Surgeon: Waynetta Sandy, MD;  Location: [place];  Service: Vascular;  Laterality: Right;  . LIGATION OF ARTERIOVENOUS  FISTULA Left 04/14/2016   Procedure: LIGATION OF ARTERIOVENOUS  FISTULA;  Surgeon: Angelia Mould, MD;  Location: [place];  Service: Vascular;  Laterality: Left;  . PATCH ANGIOPLASTY Left 06/19/2016   Procedure: PATCH ANGIOPLASTY LEFT ARTERIOVENOUS FISTULA;  Surgeon: Chuck Hint, MD;  Location: [facility] OR;  Service: Vascular;  Laterality: Left;  . REVISON OF ARTERIOVENOUS FISTULA Left 06/19/2016   Procedure: REVISION  SUPERFICIALIZATION OF BRACHIOCEPHALIC ARTERIOVENOUS FISTULA;  Surgeon: Chuck Hint, MD;  Location: [facility] OR;  Service: Vascular;  Laterality: Left;    Prior to Admission medications   Medication Sig Start Date End Date Taking? Authorizing Provider  allopurinol (ZYLOPRIM) 100 MG tablet Take 100 mg by mouth at bedtime.    Yes [provider]  ALPRAZolam Prudy Feeler) 1 MG tablet Take 1 mg by mouth 2 (two) times daily as needed for anxiety.  03/19/17  Yes [provider]  aspirin 81 MG chewable tablet Chew 1 tablet (81 mg total) by mouth daily. Patient taking differently: Chew 81 mg by mouth at bedtime.  10/03/13  Yes Rodolph Bong, MD  atorvastatin (LIPITOR) 40 MG tablet Take 40 mg by mouth at bedtime.  03/14/14  Yes [provider]  calcitRIOL (ROCALTROL) 0.5 MCG capsule Take 1 capsule (0.5 mcg total) by mouth daily with breakfast. Patient taking differently: Take 0.5 mcg by mouth at bedtime.  06/04/17  Yes Osvaldo Shipper, MD  cinacalcet (SENSIPAR) 30 MG tablet Take 1 tablet (30 mg total) by mouth daily. 08/19/17  Yes Adhikari, Willia Craze, MD  diphenhydramine-acetaminophen (TYLENOL PM) 25-500 MG TABS tablet Take 2 tablets by mouth at bedtime.   Yes [provider]  fluconazole (DIFLUCAN) 200 MG tablet Take 1 tablet (200 mg total) by mouth daily for 16 days. 08/20/17 09/05/17 Yes Burnadette Pop, MD  fluticasone (FLONASE) 50 MCG/ACT nasal spray Place 1 spray into both nostrils daily as needed for allergies or rhinitis.   Yes [provider]  gentamicin cream (GARAMYCIN) 0.1 % Apply 1 application topically daily.   Yes [provider]  lisinopril (PRINIVIL,ZESTRIL) 20 MG tablet Take 20 mg by mouth at bedtime.    Yes [provider]  metoprolol tartrate (LOPRESSOR) 25 MG tablet Take 25 mg by mouth daily as needed (for high BP > 140).  04/17/16  Yes [provider]  multivitamin (RENA-VIT) TABS tablet Take 1 tablet by mouth at bedtime.     Yes [provider]  mupirocin ointment (BACTROBAN) 2 % Apply 1 application topically 3 (three) times daily. 08/27/17  Yes [provider]  omeprazole (PRILOSEC) 40 MG capsule Take 40 mg by mouth daily.   Yes [provider]  ondansetron (ZOFRAN) 8 MG tablet Take 8 mg by mouth every 8 (eight) hours as needed for nausea or vomiting. 07/20/17  Yes [provider]  pantoprazole (PROTONIX) 40 MG tablet Take 1 tablet (40 mg total) by mouth 2 (two) times daily before a meal. Patient taking differently: Take 40 mg by mouth daily.  06/04/17  Yes Osvaldo Shipper, MD  promethazine (PHENERGAN) 12.5 MG tablet Take 12.5 mg by mouth every 6 (six) hours as needed for nausea or vomiting. 07/20/17  Yes [provider]  senna (SENOKOT) 8.6 MG TABS tablet Take 2 tablets (17.2 mg total) by mouth at bedtime. 04/15/16  Yes Rhetta Mura, MD  sucroferric oxyhydroxide (VELPHORO) 500 MG chewable tablet Chew 500 mg by mouth 3 (three) times daily with meals.   Yes [provider]  venlafaxine XR (EFFEXOR-XR) 75 MG 24 hr capsule Take 75 mg by mouth at bedtime.    Yes [provider]  oxyCODONE-acetaminophen (PERCOCET/ROXICET) 5-325 MG tablet Take  1-2 tablets by mouth every 6 (six) hours as needed for severe pain. Patient not taking: Reported on 09/01/2017 08/09/17   Elam Dutch, MD    Scheduled Meds: . darbepoetin (ARANESP) injection - DIALYSIS  60 mcg Intravenous Once  . gentamicin cream  1 application Topical Daily  . pantoprazole  40 mg Intravenous Q12H  . sodium chloride flush  3 mL Intravenous Q12H   Continuous Infusions: . sodium chloride    . fluconazole (DIFLUCAN) IV     PRN Meds:.sodium chloride, acetaminophen **OR** acetaminophen, hydrALAZINE, LORazepam, promethazine, sodium chloride flush  Allergies as of 09/01/2017 - Review Complete 09/01/2017  Allergen Reaction Noted  . Nsaids Other (See Comments) 01/27/2013  . Tolmetin Other (See  Comments) 07/20/2015    Family History  Problem Relation Age of Onset  . Hypertension Mother   . Thyroid disease Mother   . Coronary artery disease Father   . Hypertension Father   . Diabetes Father     Social History   Socioeconomic History  . Marital status: Single    Spouse name: Not on file  . Number of children: Not on file  . Years of education: Not on file  . Highest education level: Not on file  Occupational History  . Occupation: disabled  Social Needs  . Financial resource strain: Not on file  . Food insecurity:    Worry: Not on file    Inability: Not on file  . Transportation needs:    Medical: Not on file    Non-medical: Not on file  Tobacco Use  . Smoking status: Never Smoker  . Smokeless tobacco: Never Used  Substance and Sexual Activity  . Alcohol use: No  . Drug use: No  . Sexual activity: Not Currently    Birth control/protection: Surgical  Lifestyle  . Physical activity:    Days per week: Not on file    Minutes per session: Not on file  . Stress: Not on file  Relationships  . Social connections:    Talks on phone: Not on file    Gets together: Not on file    Attends religious service: Not on file    Active member of club or organization: Not on file    Attends meetings of clubs or organizations: Not on file    Relationship status: Not on file  . Intimate partner violence:    Fear of current or ex partner: No    Emotionally abused: No    Physically abused: No    Forced sexual activity: No  Other Topics Concern  . Not on file  Social History Narrative  . Not on file    Review of Systems: Review of Systems  Constitutional: Positive for malaise/fatigue. Negative for chills and fever.  HENT: Negative for hearing loss.   Eyes: Negative for blurred vision and double vision.  Respiratory: Negative for cough and hemoptysis.   Cardiovascular: Negative for chest pain and palpitations.  Gastrointestinal: Positive for heartburn, melena and  nausea. Negative for abdominal pain, constipation, diarrhea and vomiting.  Genitourinary: Negative for dysuria and urgency.  Musculoskeletal: Positive for myalgias.  Skin: Negative for itching and rash.  Neurological: Positive for dizziness and weakness. Negative for seizures and loss of consciousness.  Endo/Heme/Allergies: Does not bruise/bleed easily.  Psychiatric/Behavioral: Negative for hallucinations and suicidal ideas.    Physical Exam: Vital signs: Vitals:   09/02/17 0947 09/02/17 1233  BP: 131/74 137/72  Pulse: 70 71  Resp: 16 16  Temp: 98.6 F (37  C) 98.5 F (36.9 C)  SpO2: 99% 100%   Physical Exam  Constitutional: She is oriented to person, place, and time. She appears well-developed and well-nourished. No distress.  HENT:  Head: Normocephalic and atraumatic.  Eyes: EOM are normal. No scleral icterus.  Neck: Normal range of motion. Neck supple.  Cardiovascular: Normal rate, regular rhythm and normal heart sounds.  Pulmonary/Chest: Effort normal and breath sounds normal. No respiratory distress.  Abdominal: Soft. Bowel sounds are normal. She exhibits no distension. There is no tenderness. There is no rebound and no guarding.  PD dialysis catheter over left side  Musculoskeletal: Normal range of motion. She exhibits no edema.  Neurological: She is alert and oriented to person, place, and time.  Skin: Skin is warm. No erythema.  Psychiatric: She has a normal mood and affect. Thought content normal.  Vitals reviewed.  Last BM Date: 09/01/17 .pbp GI:  Lab Results: Recent Labs    09/01/17 2009 09/02/17 0524 09/02/17 1018  WBC 8.3 6.7  --   HGB 7.9* 6.8* 8.5*  HCT 25.7* 21.6* 26.1*  PLT 315 236  --    BMET Recent Labs    09/01/17 2009 09/02/17 0524  NA 138 136  K 4.1 4.0  CL 95* 96*  CO2 24 24  GLUCOSE 99 85  BUN 49* 57*  CREATININE 15.18* 16.34*  CALCIUM 7.9* 7.3*   LFT No results for input(s): PROT, ALBUMIN, AST, ALT, ALKPHOS, BILITOT, BILIDIR,  IBILI in the last 72 hours. PT/INR No results for input(s): LABPROT, INR in the last 72 hours.   Studies/Results: Dg Chest 2 View  Result Date: 09/01/2017 CLINICAL DATA:  Fatigue, dizziness, shortness of breath EXAM: CHEST - 2 VIEW COMPARISON:  08/14/2017 FINDINGS: Mild cardiomegaly. Low lung volumes. No confluent opacities or effusions. No acute bony abnormality. Lucency noted adjacent to the right hemidiaphragm. Cannot exclude small amount of free air under the right hemidiaphragm. Recommend correlation for abdominal pain. IMPRESSION: No acute cardiopulmonary disease.  Low lung volumes. Questionable pneumoperitoneum under the right hemidiaphragm. Recommend clinical correlation. Electronically Signed   By: Rolm Baptise M.D.   On: 09/01/2017 22:41    Impression/Plan: - melena. - Acute blood loss anemia - End-stage renal disease on peritoneal dialysis - Recent history of odynophagia. Was treated with Diflucan for possible Candida esophagitis - Intermittent dysphagia - History of colon cancer in a grand mother  Recommendations ------------------------- - Continue PPI - Monitor H&H. Transfuse as needed. - EGD tomorrow. If negative, may need inpatient colonoscopy.  Risks (bleeding, infection, bowel perforation that could require surgery, sedation-related changes in cardiopulmonary systems), benefits (identification and possible treatment of source of symptoms, exclusion of certain causes of symptoms), and alternatives (watchful waiting, radiographic imaging studies, empiric medical treatment)  were explained to patient in detail and patient wishes to proceed.    LOS: 0 days   Otis Brace  MD, FACP 09/02/2017, 2:27 PM  Contact #  [phone]

## 2017-09-02 NOTE — Consult Note (Addendum)
[facility] Renal Consultation Note  Indication for Consultation:  Management of ESRD/hemodialysis; anemia, hypertension/volume and secondary hyperparathyroidism  HPI: Patricia [name] is a 42 y.o. female with ESRD  On CCPD (On PD~  3 months followed by Dr Deterding ) admitted with  Symptomatic Anemia/ Occult pos stools in ER. Patient reports increasing fatigue  Past week and op labs at her PD center down to 7.2 . HGB 8.2 hgb 08/17/17 ( recent admit 8/10-8/15/19 Dysphagia with Esophageal Candidiasis, improved with Iv Diflucan and dc on PO Diflucan for 21 days  ) last dose Aranesp 60 mcg 08/16/17 at [facility]. TSAT 68 % then .    IN Er  Hgb 7.9 drop to 6.8  And transfusion  With prbcs given with GI consult  To see her . K 4. Bun 57  Scr 16.34  Co2=  24      Currently in room no  sob, abdominal pain , N/V reports stools always dark since taking Velphora binder. Denies gross bloody stools ."has not done PD exchange since Tuesday 08/31/17" and no pain or problems with pd exchanges .  Feels somewhat better  Now with HGb 8.5 after transfusion with PRBCs we are seeing for ESRD / CCPD management       Past Medical History:  Diagnosis Date  . Acid reflux   . Anxiety   . Arthritis   . Dyspnea   . Dysrhythmia    tachycardia  . ESRD (end stage renal disease) ([place])    TTHSAT- [place]  . Gout   . Headache(784.0)    "related to chemo; sometimes weekly" (09/12/2013)  . High cholesterol   . History of blood transfusion "a couple"   "related to low counts"  . Hypertension   . Iron deficiency anemia    "get epogen shots q month" (02/20/2014)  . MCGN (minimal change glomerulonephritis)    "using chemo to tx;  finished my last tx in 12/2013"    Past Surgical History:  Procedure Laterality Date  . ABDOMINAL HYSTERECTOMY  2010   "laparoscopic"  . ANKLE FRACTURE SURGERY Right 1994  . AV FISTULA PLACEMENT Left 04/14/2016   Procedure: ARTERIOVENOUS (AV) FISTULA CREATION;  Surgeon: Angelia Mould, MD;  Location: [facility] OR;  Service: Vascular;  Laterality: Left;  . AV FISTULA PLACEMENT Left 08/09/2017   Procedure: INSERTION OF ARTERIOVENOUS (AV) GORE-TEX GRAFT LEFT ARM;  Surgeon: Elam Dutch, MD;  Location: [facility];  Service: Vascular;  Laterality: Left;  . [place] REMOVAL Left 08/11/2017   Procedure: REMOVAL OF ARTERIOVENOUS GORETEX GRAFT ([place]);  Surgeon: Serafina Mitchell, MD;  Location: [facility] OR;  Service: Vascular;  Laterality: Left;  . CARDIAC CATHETERIZATION  2000's  . ESOPHAGOGASTRODUODENOSCOPY    . ESOPHAGOGASTRODUODENOSCOPY (EGD) WITH PROPOFOL Left 06/04/2017   Procedure: ESOPHAGOGASTRODUODENOSCOPY (EGD) WITH PROPOFOL;  Surgeon: Arta Silence, MD;  Location: [place];  Service: Endoscopy;  Laterality: Left;  . FISTULA SUPERFICIALIZATION Left 04/02/2017   Procedure: FISTULA SUPERFICIALIZATION LEFT ARM;  Surgeon: Serafina Mitchell, MD;  Location: [place];  Service: Vascular;  Laterality: Left;  . FISTULOGRAM Left 04/07/2016   Procedure: FISTULOGRAM;  Surgeon: Waynetta Sandy, MD;  Location: [place];  Service: Vascular;  Laterality: Left;  . FRACTURE SURGERY     right ankle  . INSERTION OF DIALYSIS CATHETER Right 04/07/2016   Procedure: INSERTION OF DIALYSIS CATHETER;  Surgeon: Waynetta Sandy, MD;  Location: [place];  Service: Vascular;  Laterality: Right;  . INSERTION OF DIALYSIS CATHETER Right 04/04/2017  Procedure: INSERTION OF DIALYSIS CATHETER;  Surgeon: Waynetta Sandy, MD;  Location: [place];  Service: Vascular;  Laterality: Right;  . LIGATION OF ARTERIOVENOUS  FISTULA Left 04/14/2016   Procedure: LIGATION OF ARTERIOVENOUS  FISTULA;  Surgeon: Angelia Mould, MD;  Location: [place];  Service: Vascular;  Laterality: Left;  . PATCH ANGIOPLASTY Left 06/19/2016   Procedure: PATCH ANGIOPLASTY LEFT ARTERIOVENOUS FISTULA;  Surgeon: Angelia Mould, MD;  Location: [place];  Service: Vascular;  Laterality: Left;  . REVISON OF ARTERIOVENOUS FISTULA Left  06/19/2016   Procedure: REVISION SUPERFICIALIZATION OF BRACHIOCEPHALIC ARTERIOVENOUS FISTULA;  Surgeon: Angelia Mould, MD;  Location: [facility] OR;  Service: Vascular;  Laterality: Left;      Family History  Problem Relation Age of Onset  . Hypertension Mother   . Thyroid disease Mother   . Coronary artery disease Father   . Hypertension Father   . Diabetes Father       reports that she has never smoked. She has never used smokeless tobacco. She reports that she does not drink alcohol or use drugs.   Allergies  Allergen Reactions  . Nsaids Other (See Comments)    Cannot take due to Kidney disease, Kidney disease, Kidney function   . Tolmetin Other (See Comments)    Cannot take due to Kidney Disease    Prior to Admission medications   Medication Sig Start Date End Date Taking? Authorizing Provider  allopurinol (ZYLOPRIM) 100 MG tablet Take 100 mg by mouth at bedtime.    Yes [provider]  ALPRAZolam Duanne Moron) 1 MG tablet Take 1 mg by mouth 2 (two) times daily as needed for anxiety.  03/19/17  Yes [provider]  aspirin 81 MG chewable tablet Chew 1 tablet (81 mg total) by mouth daily. Patient taking differently: Chew 81 mg by mouth at bedtime.  10/03/13  Yes Eugenie Filler, MD  atorvastatin (LIPITOR) 40 MG tablet Take 40 mg by mouth at bedtime.  03/14/14  Yes [provider]  calcitRIOL (ROCALTROL) 0.5 MCG capsule Take 1 capsule (0.5 mcg total) by mouth daily with breakfast. Patient taking differently: Take 0.5 mcg by mouth at bedtime.  06/04/17  Yes Bonnielee Haff, MD  cinacalcet (SENSIPAR) 30 MG tablet Take 1 tablet (30 mg total) by mouth daily. 08/19/17  Yes Adhikari, Tamsen Meek, MD  diphenhydramine-acetaminophen (TYLENOL PM) 25-500 MG TABS tablet Take 2 tablets by mouth at bedtime.   Yes [provider]  fluconazole (DIFLUCAN) 200 MG tablet Take 1 tablet (200 mg total) by mouth daily for 16 days. 08/20/17 09/05/17 Yes Shelly Coss, MD   fluticasone (FLONASE) 50 MCG/ACT nasal spray Place 1 spray into both nostrils daily as needed for allergies or rhinitis.   Yes [provider]  gentamicin cream (GARAMYCIN) 0.1 % Apply 1 application topically daily.   Yes [provider]  lisinopril (PRINIVIL,ZESTRIL) 20 MG tablet Take 20 mg by mouth at bedtime.    Yes [provider]  metoprolol tartrate (LOPRESSOR) 25 MG tablet Take 25 mg by mouth daily as needed (for high BP > 140).  04/17/16  Yes [provider]  multivitamin (RENA-VIT) TABS tablet Take 1 tablet by mouth at bedtime.    Yes [provider]  mupirocin ointment (BACTROBAN) 2 % Apply 1 application topically 3 (three) times daily. 08/27/17  Yes [provider]  omeprazole (PRILOSEC) 40 MG capsule Take 40 mg by mouth daily.   Yes [provider]  ondansetron (ZOFRAN) 8 MG tablet Take  8 mg by mouth every 8 (eight) hours as needed for nausea or vomiting. 07/20/17  Yes [provider]  pantoprazole (PROTONIX) 40 MG tablet Take 1 tablet (40 mg total) by mouth 2 (two) times daily before a meal. Patient taking differently: Take 40 mg by mouth daily.  06/04/17  Yes Bonnielee Haff, MD  promethazine (PHENERGAN) 12.5 MG tablet Take 12.5 mg by mouth every 6 (six) hours as needed for nausea or vomiting. 07/20/17  Yes [provider]  senna (SENOKOT) 8.6 MG TABS tablet Take 2 tablets (17.2 mg total) by mouth at bedtime. 04/15/16  Yes Nita Sells, MD  sucroferric oxyhydroxide (VELPHORO) 500 MG chewable tablet Chew 500 mg by mouth 3 (three) times daily with meals.   Yes [provider]  venlafaxine XR (EFFEXOR-XR) 75 MG 24 hr capsule Take 75 mg by mouth at bedtime.    Yes [provider]  oxyCODONE-acetaminophen (PERCOCET/ROXICET) 5-325 MG tablet Take 1-2 tablets by mouth every 6 (six) hours as needed for severe pain. Patient not taking: Reported on 09/01/2017 08/09/17   Elam Dutch, MD     MOL:MBEMLJ chloride, acetaminophen **OR** acetaminophen, hydrALAZINE, LORazepam, promethazine, sodium chloride flush  Results for orders placed or performed during the hospital encounter of 09/01/17 (from the past 48 hour(s))  Type and screen [place]     Status: None (Preliminary result)   Collection Time: 09/01/17  8:07 PM  Result Value Ref Range   ABO/RH(D) O NEG    Antibody Screen NEG    Sample Expiration 09/04/2017    Unit Number [id number]    Blood Component Type RED CELLS,LR    Unit division 00    Status of Unit ISSUED    Transfusion Status OK TO TRANSFUSE    Crossmatch Result      Compatible Performed at [facility], [address], [place], [place] [address]   CBC     Status: Abnormal   Collection Time: 09/01/17  8:09 PM  Result Value Ref Range   WBC 8.3 4.0 - 10.5 K/uL   RBC 2.99 (L) 3.87 - 5.11 MIL/uL   Hemoglobin 7.9 (L) 12.0 - 15.0 g/dL   HCT 25.7 (L) 36.0 - 46.0 %   MCV 86.0 78.0 - 100.0 fL   MCH 26.4 26.0 - 34.0 pg   MCHC 30.7 30.0 - 36.0 g/dL   RDW 16.4 (H) 11.5 - 15.5 %   Platelets 315 150 - 400 K/uL    Comment: Performed at [facility] [address], [place], [place] [address]  Basic metabolic panel     Status: Abnormal   Collection Time: 09/01/17  8:09 PM  Result Value Ref Range   Sodium 138 135 - 145 mmol/L   Potassium 4.1 3.5 - 5.1 mmol/L   Chloride 95 (L) 98 - 111 mmol/L   CO2 24 22 - 32 mmol/L   Glucose, Bld 99 70 - 99 mg/dL   BUN 49 (H) 6 - 20 mg/dL   Creatinine, Ser 15.18 (H) 0.44 - 1.00 mg/dL   Calcium 7.9 (L) 8.9 - 10.3 mg/dL   GFR calc non Af Amer 3 (L) >60 mL/min   GFR calc Af Amer 3 (L) >60 mL/min    Comment: (NOTE) The eGFR has been calculated using the CKD EPI equation. This calculation has not been validated in all clinical situations. eGFR's persistently <60 mL/min signify possible Chronic Kidney Disease.    Anion gap 19 (H) 5 - 15  Comment: Performed at [facility], [place] [address] [place], [place] [address]  POC occult blood, ED     Status: Abnormal   Collection Time: 09/01/17 10:51 PM  Result Value Ref Range   Fecal Occult Bld POSITIVE (A) NEGATIVE  Basic metabolic panel     Status: Abnormal   Collection Time: 09/02/17  5:24 AM  Result Value Ref Range   Sodium 136 135 - 145 mmol/L   Potassium 4.0 3.5 - 5.1 mmol/L   Chloride 96 (L) 98 - 111 mmol/L   CO2 24 22 - 32 mmol/L   Glucose, Bld 85 70 - 99 mg/dL   BUN 57 (H) 6 - 20 mg/dL   Creatinine, Ser 16.34 (H) 0.44 - 1.00 mg/dL   Calcium 7.3 (L) 8.9 - 10.3 mg/dL   GFR calc non Af Amer 2 (L) >60 mL/min   GFR calc Af Amer 3 (L) >60 mL/min    Comment: (NOTE) The eGFR has been calculated using the CKD EPI equation. This calculation has not been validated in all clinical situations. eGFR's persistently <60 mL/min signify possible Chronic Kidney Disease.    Anion gap 16 (H) 5 - 15    Comment: Performed at [facility], [place] [address], [place], [place] [address]  CBC     Status: Abnormal   Collection Time: 09/02/17  5:24 AM  Result Value Ref Range   WBC 6.7 4.0 - 10.5 K/uL   RBC 2.56 (L) 3.87 - 5.11 MIL/uL   Hemoglobin 6.8 (LL) 12.0 - 15.0 g/dL    Comment: REPEATED TO VERIFY CRITICAL RESULT CALLED TO, READ BACK BY AND VERIFIED WITH: A. MCMILLIAN,RN 0618 09/02/2017 T. TYSOR    HCT 21.6 (L) 36.0 - 46.0 %   MCV 84.4 78.0 - 100.0 fL   MCH 26.6 26.0 - 34.0 pg   MCHC 31.5 30.0 - 36.0 g/dL   RDW 16.0 (H) 11.5 - 15.5 %   Platelets 236 150 - 400 K/uL    Comment: Performed at [facility] [address], [place], [place] [address]  Prepare RBC     Status: None   Collection Time: 09/02/17  6:40 AM  Result Value Ref Range   Order Confirmation      ORDER PROCESSED BY BLOOD BANK Performed at [facility], [place] [address] [place], [place] [address]   Hemoglobin and hematocrit, blood     Status: Abnormal   Collection Time: 09/02/17 10:18 AM  Result Value Ref Range   Hemoglobin 8.5 (L) 12.0 - 15.0  g/dL   HCT 26.1 (L) 36.0 - 46.0 %    Comment: Performed at [facility], [employer] [address], [place], [place] [address]    ROS:  As in HPI  Physical Exam: Vitals:   09/02/17 0744 09/02/17 0947  BP: 131/73 131/74  Pulse: 61 70  Resp: 17 16  Temp: 98.1 F (36.7 C) 98.6 F (37 C)  SpO2: 100% 99%     General: alert obese AAF, NAD resting comfortably in her Bed   HEENT:  , non icteric, mmm Neck: supple no jvd Heart: RRR ,no m,r,g  Lungs: CTA biat, non labored breathing  Abdomen: Obese, BS pos, SOFT NT, ND  PD cath bandaged clean and dry  Extremities: no pedal edema  Skin: no overt rash or pedal ulcer  Neuro: alert OX3, no acute focal deficits appreciated , moves all extrem  Independently  Dialysis Access: Old LUA AVG sites( AVG  removed ) healed , PD cath   CCPD Orders:7X Week, EDW 108.5 (kg) Ca 2.5 (mEq/L) Mg 0.5 (mEq/L) Dextrose 1.5; 2.5; 4.25 %, 5 # Exchanges  5 fills vol 3000 mL, Dwell Time 2 hours last fill vol 3000 mL, Day Exchanges 1, daytime fill vol 3000 mL, Day Dwell Time 4 hrsDialysis   Assessment/Plan 1. ESRD -  ccpd  Tonight  2. Symptomatic Anemia  With Stools occult positive -  GI to see per admit / sp transfusion PRBCS   3. Hypertension/volume  - bp stable, home meds= Lopressor 64m  q  Day / Lisinopril 20 mg  Hs  Would hold for now monitor bp , has essentially been npo past day and "not much by mouth since Tues, Wts here  ?? 131.4kg  today and 114.7 kg yesterday use 2.5%, 1.5 % bags tonight  Follow up STANDING  wts   4. Anemia  Of ESRD- use Aranesp  60 mcg  q weekly  5. Metabolic bone disease -  On sensipar, calcitriol ,   6. HO  Recent   Esophageal candidiasi-diflucan IV while in hospital  DErnest Haber PA-C [phone]/29/2019, 11:34 AM

## 2017-09-02 NOTE — Progress Notes (Addendum)
PROGRESS NOTE    Patricia [name]  JME:[id number] DOB: March 14, 1975 DOA: 09/01/2017 PCP: Blair Heys, PA-C   Brief Narrative:  42 year old female with a history of ESRD on peritoneal dialysis, with last session on 09/01/2017, anxiety, hypertension, recent history of esophageal candidiasis treated with Diflucan, brought to the emergency department due complains of dark stools, and symptomatic anemia as well as recurrence of her dysphagia.  On outpatient blood work she was found to have a hemoglobin of 7.9.down from 8.2 on 08/17/2017.  BUN was 49.  She had normal potassium and bicarbonate.  Patient had a Hemoccult positive.  Type and screen was performed, GI was consulted, who recommended admission.     Assessment & Plan:   Principal Problem:   [place] Active Problems:   HTN (hypertension)   Normocytic anemia   ESRD (end stage renal disease) (HCC)   Dysphagia   Esophageal candidiasis (HCC)   Anxiety   Symptomatic anemia, with dark stools, and occult positive.  As mentioned above, hemoglobin on admission was 7.9, with BUN 49.  This dropped to hemoglobin of 6.8, requiring 1 unit of blood transfusion, which he tolerated well.  Baseline is between 8 and 9.  Patient continues to be symptomatic.  She denies any worsening shortness of breath.  Her main complaint is fatigue.   Repeat CBC after transfusion, and monitor closely. GI consult pending, appreciate their involvement. Continue to hold aspirin  Esophageal candidiasis.  Patient was admitted 2 weeks ago with dysphagia, attributed to esophageal candidiasis, improved with Diflucan, then with recurrent symptoms.  She was started on IV Diflucan. Continue IV Diflucan.  ESRD, on peritoneal dialysis at home.  Currently her creatinine is 16.  She has missed her dialysis last night.  Potassium is 4.  BUN 57.  GFR is 2.  The patient does report making urine still.  Spoke with nephrology, who is to consult today. Repeat BMP in the morning Appreciate  nephrology follow-up.  We will continue with peritoneal dialysis on a daily basis. Renal diet.   Anxiety Continue Ativan as needed every 6 hours   Hypertension BP 131/74   Pulse 70   Controlled Continue hydralazine 10 mg IV every 4 hours as needed after dialysis   DVT prophylaxis: SCD Code Status: Full code  Family Communication: Discussed with patient Disposition Plan: To home, when stable.  Consultants: Nephrology, 09/02/2017, for peritoneal dialysis                         GI, on 09/01/2017 by EDP  for esophageal candidiasis.   Procedures: For peritoneal dialysis today  Antimicrobials:  No antibiotics. It is on IV Diflucan for esophageal candidiasis   Subjective: Patient feels still very weak, with no energy, but denies any worsening shortness of breath, or cough.  Her dysphagia is improved.  She denies any abdominal pain, nausea or vomiting.  She denies any lower extremity swelling, or calf pain.  She feels that she needs to have her peritoneal dialysis done.  She still making urine, and she denies any dysuria or gross hematuria.  Any loose stools.  Objective: Vitals:   09/02/17 0600 09/02/17 0715 09/02/17 0744 09/02/17 0947  BP: 117/74 126/77 131/73 131/74  Pulse: 70 69 61 70  Resp: $Remo'16 18 17 16  'PtoLc$ Temp: 98 F (36.7 C) 98.5 F (36.9 C) 98.1 F (36.7 C) 98.6 F (37 C)  TempSrc: Oral Oral Oral Oral  SpO2: 95% 96% 100% 99%  Weight: 131.4 kg  Height:        Intake/Output Summary (Last 24 hours) at 09/02/2017 1052 Last data filed at 09/02/2017 0947 Gross per 24 hour  Intake 315 ml  Output -  Net 315 ml   Filed Weights   09/02/17 0233 09/02/17 0600  Weight: 114.7 kg 131.4 kg    Examination:  General exam: Appears calm , chronically ill-appearing Respiratory system: Clear to auscultation. Respiratory effort normal. Cardiovascular system: S1 & S2 heard, RRR. No JVD, murmurs, rubs, gallops or clicks. No pedal edema. Gastrointestinal system: Abdomen is  nondistended, is soft and nontender. No organomegaly or masses felt. Normal bowel sounds heard. Central nervous system: Alert and oriented. No focal neurological deficits. Extremities: Symmetric 5 x 5 power. Skin: No rashes, lesions or ulcers Psychiatry: Judgement and insight appear normal. Mood & affect appropriate.     Data Reviewed: I have personally reviewed following labs and imaging studies  CBC: Recent Labs  Lab 09/01/17 2009 09/02/17 0524  WBC 8.3 6.7  HGB 7.9* 6.8*  HCT 25.7* 21.6*  MCV 86.0 84.4  PLT 315 236   Basic Metabolic Panel: Recent Labs  Lab 09/01/17 2009 09/02/17 0524  NA 138 136  K 4.1 4.0  CL 95* 96*  CO2 24 24  GLUCOSE 99 85  BUN 49* 57*  CREATININE 15.18* 16.34*  CALCIUM 7.9* 7.3*   GFR: Estimated Creatinine Clearance: 6.2 mL/min (A) (by C-G formula based on SCr of 16.34 mg/dL (H)). Liver Function Tests: No results for input(s): AST, ALT, ALKPHOS, BILITOT, PROT, ALBUMIN in the last 168 hours. No results for input(s): LIPASE, AMYLASE in the last 168 hours. No results for input(s): AMMONIA in the last 168 hours. Coagulation Profile: No results for input(s): INR, PROTIME in the last 168 hours. Cardiac Enzymes: No results for input(s): CKTOTAL, CKMB, CKMBINDEX, TROPONINI in the last 168 hours. BNP (last 3 results) No results for input(s): PROBNP in the last 8760 hours. HbA1C: No results for input(s): HGBA1C in the last 72 hours. CBG: No results for input(s): GLUCAP in the last 168 hours. Lipid Profile: No results for input(s): CHOL, HDL, LDLCALC, TRIG, CHOLHDL, LDLDIRECT in the last 72 hours. Thyroid Function Tests: No results for input(s): TSH, T4TOTAL, FREET4, T3FREE, THYROIDAB in the last 72 hours. Anemia Panel: No results for input(s): VITAMINB12, FOLATE, FERRITIN, TIBC, IRON, RETICCTPCT in the last 72 hours. Sepsis Labs: No results for input(s): PROCALCITON, LATICACIDVEN in the last 168 hours.  No results found for this or any  previous visit (from the past 240 hour(s)).       Radiology Studies: Dg Chest 2 View  Result Date: 09/01/2017 CLINICAL DATA:  Fatigue, dizziness, shortness of breath EXAM: CHEST - 2 VIEW COMPARISON:  08/14/2017 FINDINGS: Mild cardiomegaly. Low lung volumes. No confluent opacities or effusions. No acute bony abnormality. Lucency noted adjacent to the right hemidiaphragm. Cannot exclude small amount of free air under the right hemidiaphragm. Recommend correlation for abdominal pain. IMPRESSION: No acute cardiopulmonary disease.  Low lung volumes. Questionable pneumoperitoneum under the right hemidiaphragm. Recommend clinical correlation. Electronically Signed   By: Charlett Nose M.D.   On: 09/01/2017 22:41        Scheduled Meds: . pantoprazole  40 mg Intravenous Q12H  . sodium chloride flush  3 mL Intravenous Q12H   Continuous Infusions: . sodium chloride    . fluconazole (DIFLUCAN) IV       LOS: 0 days    Time spent:    Marlowe Kays, MD [facility] Pager [phone]  If 7PM-7AM, please contact night-coverage [email or web address] Password [facility] 09/02/2017, 10:52 AM

## 2017-09-02 NOTE — Consult Note (Signed)
Referring Provider: [facility] Primary Care Physician:  Blair Heys, PA-C Primary Gastroenterologist:  [facility]   Reason for Consultation:  Melena   HPI: Patricia [name] is a 42 y.o. female with past medical history of end-stage renal disease on peritoneal dialysis, history of chronic anemia, history of GERD admitted to hospital for further evaluation of Stool dysphagia and drop in hemoglobin. She underwent EGD on 06/04/2017 by Dr. Paulita Fujita for evaluation of melena which showed mild gastritis and duodenitis.she was recently discharged from hospital on 08/19/2017. At that time she was treated with Diflucan for possible Candida esophagitis.  she started noticing dark stools after discharge. Subsequently she started having fatigue and weakness. Her pain while swallowing has improved. Complaining of intermittent dysphagia to certain foods which is chronic. She denies any bright red blood per rectum. Denies diarrhea or constipation.she denies any abdominal pain but complaining of intermittent abdominal cramps.  History of colon cancer in grandmother. No previous colonoscopy  Past Medical History:  Diagnosis Date  . Acid reflux   . Anxiety   . Arthritis   . Dyspnea   . Dysrhythmia    tachycardia  . ESRD (end stage renal disease) ([place])    [employer]- [employer]  . Gout   . Headache(784.0)    "related to chemo; sometimes weekly" (09/12/2013)  . High cholesterol   . History of blood transfusion "a couple"   "related to low counts"  . Hypertension   . Iron deficiency anemia    "get epogen shots q month" (02/20/2014)  . MCGN (minimal change glomerulonephritis)    "using chemo to tx;  finished my last tx in 12/2013"    Past Surgical History:  Procedure Laterality Date  . ABDOMINAL HYSTERECTOMY  2010   "laparoscopic"  . ANKLE FRACTURE SURGERY Right 1994  . AV FISTULA PLACEMENT Left 04/14/2016   Procedure: ARTERIOVENOUS (AV) FISTULA CREATION;  Surgeon: Angelia Mould, MD;  Location: [facility] OR;   Service: Vascular;  Laterality: Left;  . AV FISTULA PLACEMENT Left 08/09/2017   Procedure: INSERTION OF ARTERIOVENOUS (AV) GORE-TEX GRAFT LEFT ARM;  Surgeon: Elam Dutch, MD;  Location: [place];  Service: Vascular;  Laterality: Left;  . [place] REMOVAL Left 08/11/2017   Procedure: REMOVAL OF ARTERIOVENOUS GORETEX GRAFT ([place]);  Surgeon: Serafina Mitchell, MD;  Location: [facility] OR;  Service: Vascular;  Laterality: Left;  . CARDIAC CATHETERIZATION  2000's  . ESOPHAGOGASTRODUODENOSCOPY    . ESOPHAGOGASTRODUODENOSCOPY (EGD) WITH PROPOFOL Left 06/04/2017   Procedure: ESOPHAGOGASTRODUODENOSCOPY (EGD) WITH PROPOFOL;  Surgeon: Arta Silence, MD;  Location: [place];  Service: Endoscopy;  Laterality: Left;  . FISTULA SUPERFICIALIZATION Left 04/02/2017   Procedure: FISTULA SUPERFICIALIZATION LEFT ARM;  Surgeon: Serafina Mitchell, MD;  Location: [place];  Service: Vascular;  Laterality: Left;  . FISTULOGRAM Left 04/07/2016   Procedure: FISTULOGRAM;  Surgeon: Waynetta Sandy, MD;  Location: [place];  Service: Vascular;  Laterality: Left;  . FRACTURE SURGERY     right ankle  . INSERTION OF DIALYSIS CATHETER Right 04/07/2016   Procedure: INSERTION OF DIALYSIS CATHETER;  Surgeon: Waynetta Sandy, MD;  Location: [place];  Service: Vascular;  Laterality: Right;  . INSERTION OF DIALYSIS CATHETER Right 04/04/2017   Procedure: INSERTION OF DIALYSIS CATHETER;  Surgeon: Waynetta Sandy, MD;  Location: [place];  Service: Vascular;  Laterality: Right;  . LIGATION OF ARTERIOVENOUS  FISTULA Left 04/14/2016   Procedure: LIGATION OF ARTERIOVENOUS  FISTULA;  Surgeon: Angelia Mould, MD;  Location: [place];  Service: Vascular;  Laterality: Left;  . PATCH ANGIOPLASTY Left 06/19/2016   Procedure: PATCH ANGIOPLASTY LEFT ARTERIOVENOUS FISTULA;  Surgeon: Angelia Mould, MD;  Location: [place];  Service: Vascular;  Laterality: Left;  . REVISON OF ARTERIOVENOUS FISTULA Left 06/19/2016   Procedure: REVISION  SUPERFICIALIZATION OF BRACHIOCEPHALIC ARTERIOVENOUS FISTULA;  Surgeon: Angelia Mould, MD;  Location: [place];  Service: Vascular;  Laterality: Left;    Prior to Admission medications   Medication Sig Start Date End Date Taking? Authorizing Provider  allopurinol (ZYLOPRIM) 100 MG tablet Take 100 mg by mouth at bedtime.    Yes [provider]  ALPRAZolam Duanne Moron) 1 MG tablet Take 1 mg by mouth 2 (two) times daily as needed for anxiety.  03/19/17  Yes [provider]  aspirin 81 MG chewable tablet Chew 1 tablet (81 mg total) by mouth daily. Patient taking differently: Chew 81 mg by mouth at bedtime.  10/03/13  Yes Eugenie Filler, MD  atorvastatin (LIPITOR) 40 MG tablet Take 40 mg by mouth at bedtime.  03/14/14  Yes [provider]  calcitRIOL (ROCALTROL) 0.5 MCG capsule Take 1 capsule (0.5 mcg total) by mouth daily with breakfast. Patient taking differently: Take 0.5 mcg by mouth at bedtime.  06/04/17  Yes Bonnielee Haff, MD  cinacalcet (SENSIPAR) 30 MG tablet Take 1 tablet (30 mg total) by mouth daily. 08/19/17  Yes Adhikari, Tamsen Meek, MD  diphenhydramine-acetaminophen (TYLENOL PM) 25-500 MG TABS tablet Take 2 tablets by mouth at bedtime.   Yes [provider]  fluconazole (DIFLUCAN) 200 MG tablet Take 1 tablet (200 mg total) by mouth daily for 16 days. 08/20/17 09/05/17 Yes Shelly Coss, MD  fluticasone (FLONASE) 50 MCG/ACT nasal spray Place 1 spray into both nostrils daily as needed for allergies or rhinitis.   Yes [provider]  gentamicin cream (GARAMYCIN) 0.1 % Apply 1 application topically daily.   Yes [provider]  lisinopril (PRINIVIL,ZESTRIL) 20 MG tablet Take 20 mg by mouth at bedtime.    Yes [provider]  metoprolol tartrate (LOPRESSOR) 25 MG tablet Take 25 mg by mouth daily as needed (for high BP > 140).  04/17/16  Yes [provider]  multivitamin (RENA-VIT) TABS tablet Take 1 tablet by mouth at bedtime.     Yes [provider]  mupirocin ointment (BACTROBAN) 2 % Apply 1 application topically 3 (three) times daily. 08/27/17  Yes [provider]  omeprazole (PRILOSEC) 40 MG capsule Take 40 mg by mouth daily.   Yes [provider]  ondansetron (ZOFRAN) 8 MG tablet Take 8 mg by mouth every 8 (eight) hours as needed for nausea or vomiting. 07/20/17  Yes [provider]  pantoprazole (PROTONIX) 40 MG tablet Take 1 tablet (40 mg total) by mouth 2 (two) times daily before a meal. Patient taking differently: Take 40 mg by mouth daily.  06/04/17  Yes Bonnielee Haff, MD  promethazine (PHENERGAN) 12.5 MG tablet Take 12.5 mg by mouth every 6 (six) hours as needed for nausea or vomiting. 07/20/17  Yes [provider]  senna (SENOKOT) 8.6 MG TABS tablet Take 2 tablets (17.2 mg total) by mouth at bedtime. 04/15/16  Yes Nita Sells, MD  sucroferric oxyhydroxide (VELPHORO) 500 MG chewable tablet Chew 500 mg by mouth 3 (three) times daily with meals.   Yes [provider]  venlafaxine XR (EFFEXOR-XR) 75 MG 24 hr capsule Take 75 mg by mouth at bedtime.    Yes [provider]  oxyCODONE-acetaminophen (PERCOCET/ROXICET) 5-325 MG tablet Take  1-2 tablets by mouth every 6 (six) hours as needed for severe pain. Patient not taking: Reported on 09/01/2017 08/09/17   Elam Dutch, MD    Scheduled Meds: . darbepoetin (ARANESP) injection - DIALYSIS  60 mcg Intravenous Once  . gentamicin cream  1 application Topical Daily  . pantoprazole  40 mg Intravenous Q12H  . sodium chloride flush  3 mL Intravenous Q12H   Continuous Infusions: . sodium chloride    . fluconazole (DIFLUCAN) IV     PRN Meds:.sodium chloride, acetaminophen **OR** acetaminophen, hydrALAZINE, LORazepam, promethazine, sodium chloride flush  Allergies as of 09/01/2017 - Review Complete 09/01/2017  Allergen Reaction Noted  . Nsaids Other (See Comments) 01/27/2013  . Tolmetin Other (See  Comments) 07/20/2015    Family History  Problem Relation Age of Onset  . Hypertension Mother   . Thyroid disease Mother   . Coronary artery disease Father   . Hypertension Father   . Diabetes Father     Social History   Socioeconomic History  . Marital status: Single    Spouse name: Not on file  . Number of children: Not on file  . Years of education: Not on file  . Highest education level: Not on file  Occupational History  . Occupation: disabled  Social Needs  . Financial resource strain: Not on file  . Food insecurity:    Worry: Not on file    Inability: Not on file  . Transportation needs:    Medical: Not on file    Non-medical: Not on file  Tobacco Use  . Smoking status: Never Smoker  . Smokeless tobacco: Never Used  Substance and Sexual Activity  . Alcohol use: No  . Drug use: No  . Sexual activity: Not Currently    Birth control/protection: Surgical  Lifestyle  . Physical activity:    Days per week: Not on file    Minutes per session: Not on file  . Stress: Not on file  Relationships  . Social connections:    Talks on phone: Not on file    Gets together: Not on file    Attends religious service: Not on file    Active member of club or organization: Not on file    Attends meetings of clubs or organizations: Not on file    Relationship status: Not on file  . Intimate partner violence:    Fear of current or ex partner: No    Emotionally abused: No    Physically abused: No    Forced sexual activity: No  Other Topics Concern  . Not on file  Social History Narrative  . Not on file    Review of Systems: Review of Systems  Constitutional: Positive for malaise/fatigue. Negative for chills and fever.  HENT: Negative for hearing loss.   Eyes: Negative for blurred vision and double vision.  Respiratory: Negative for cough and hemoptysis.   Cardiovascular: Negative for chest pain and palpitations.  Gastrointestinal: Positive for heartburn, melena and  nausea. Negative for abdominal pain, constipation, diarrhea and vomiting.  Genitourinary: Negative for dysuria and urgency.  Musculoskeletal: Positive for myalgias.  Skin: Negative for itching and rash.  Neurological: Positive for dizziness and weakness. Negative for seizures and loss of consciousness.  Endo/Heme/Allergies: Does not bruise/bleed easily.  Psychiatric/Behavioral: Negative for hallucinations and suicidal ideas.    Physical Exam: Vital signs: Vitals:   09/02/17 0947 09/02/17 1233  BP: 131/74 137/72  Pulse: 70 71  Resp: 16 16  Temp: 98.6 F (37  C) 98.5 F (36.9 C)  SpO2: 99% 100%   Physical Exam  Constitutional: She is oriented to person, place, and time. She appears well-developed and well-nourished. No distress.  HENT:  Head: Normocephalic and atraumatic.  Eyes: EOM are normal. No scleral icterus.  Neck: Normal range of motion. Neck supple.  Cardiovascular: Normal rate, regular rhythm and normal heart sounds.  Pulmonary/Chest: Effort normal and breath sounds normal. No respiratory distress.  Abdominal: Soft. Bowel sounds are normal. She exhibits no distension. There is no tenderness. There is no rebound and no guarding.  PD dialysis catheter over left side  Musculoskeletal: Normal range of motion. She exhibits no edema.  Neurological: She is alert and oriented to person, place, and time.  Skin: Skin is warm. No erythema.  Psychiatric: She has a normal mood and affect. Thought content normal.  Vitals reviewed.  Last BM Date: 09/01/17 .pbp GI:  Lab Results: Recent Labs    09/01/17 2009 09/02/17 0524 09/02/17 1018  WBC 8.3 6.7  --   HGB 7.9* 6.8* 8.5*  HCT 25.7* 21.6* 26.1*  PLT 315 236  --    BMET Recent Labs    09/01/17 2009 09/02/17 0524  NA 138 136  K 4.1 4.0  CL 95* 96*  CO2 24 24  GLUCOSE 99 85  BUN 49* 57*  CREATININE 15.18* 16.34*  CALCIUM 7.9* 7.3*   LFT No results for input(s): PROT, ALBUMIN, AST, ALT, ALKPHOS, BILITOT, BILIDIR,  IBILI in the last 72 hours. PT/INR No results for input(s): LABPROT, INR in the last 72 hours.   Studies/Results: Dg Chest 2 View  Result Date: 09/01/2017 CLINICAL DATA:  Fatigue, dizziness, shortness of breath EXAM: CHEST - 2 VIEW COMPARISON:  08/14/2017 FINDINGS: Mild cardiomegaly. Low lung volumes. No confluent opacities or effusions. No acute bony abnormality. Lucency noted adjacent to the right hemidiaphragm. Cannot exclude small amount of free air under the right hemidiaphragm. Recommend correlation for abdominal pain. IMPRESSION: No acute cardiopulmonary disease.  Low lung volumes. Questionable pneumoperitoneum under the right hemidiaphragm. Recommend clinical correlation. Electronically Signed   By: Rolm Baptise M.D.   On: 09/01/2017 22:41    Impression/Plan: - melena. - Acute blood loss anemia - End-stage renal disease on peritoneal dialysis - Recent history of odynophagia. Was treated with Diflucan for possible Candida esophagitis - Intermittent dysphagia - History of colon cancer in a grand mother  Recommendations ------------------------- - Continue PPI - Monitor H&H. Transfuse as needed. - EGD tomorrow. If negative, may need inpatient colonoscopy.  Risks (bleeding, infection, bowel perforation that could require surgery, sedation-related changes in cardiopulmonary systems), benefits (identification and possible treatment of source of symptoms, exclusion of certain causes of symptoms), and alternatives (watchful waiting, radiographic imaging studies, empiric medical treatment)  were explained to patient in detail and patient wishes to proceed.    LOS: 0 days   Otis Brace  MD, FACP 09/02/2017, 2:27 PM  Contact #  [phone]

## 2017-09-02 NOTE — Progress Notes (Signed)
Opyd, MD paged about hemoglobin of 6.8.   Will continue to monitor.

## 2017-09-03 ENCOUNTER — Observation Stay (HOSPITAL_COMMUNITY): Payer: Medicare Other | Admitting: Certified Registered Nurse Anesthetist

## 2017-09-03 ENCOUNTER — Encounter (HOSPITAL_COMMUNITY)
Admission: EM | Disposition: A | Discharge status: Home / Self Care | Payer: Self-pay | Source: Home / Self Care | Attending: Internal Medicine

## 2017-09-03 DIAGNOSIS — F419 Anxiety disorder, unspecified: Secondary | ICD-10-CM | POA: Diagnosis present

## 2017-09-03 DIAGNOSIS — D123 Benign neoplasm of transverse colon: Secondary | ICD-10-CM | POA: Diagnosis present

## 2017-09-03 DIAGNOSIS — K449 Diaphragmatic hernia without obstruction or gangrene: Secondary | ICD-10-CM | POA: Diagnosis present

## 2017-09-03 DIAGNOSIS — Z992 Dependence on renal dialysis: Secondary | ICD-10-CM | POA: Diagnosis not present

## 2017-09-03 DIAGNOSIS — K2970 Gastritis, unspecified, without bleeding: Secondary | ICD-10-CM | POA: Diagnosis present

## 2017-09-03 DIAGNOSIS — K219 Gastro-esophageal reflux disease without esophagitis: Secondary | ICD-10-CM | POA: Diagnosis present

## 2017-09-03 DIAGNOSIS — B3781 Candidal esophagitis: Secondary | ICD-10-CM | POA: Diagnosis present

## 2017-09-03 DIAGNOSIS — Z6838 Body mass index (BMI) 38.0-38.9, adult: Secondary | ICD-10-CM | POA: Diagnosis not present

## 2017-09-03 DIAGNOSIS — E8889 Other specified metabolic disorders: Secondary | ICD-10-CM | POA: Diagnosis present

## 2017-09-03 DIAGNOSIS — N2581 Secondary hyperparathyroidism of renal origin: Secondary | ICD-10-CM | POA: Diagnosis present

## 2017-09-03 DIAGNOSIS — M109 Gout, unspecified: Secondary | ICD-10-CM | POA: Diagnosis present

## 2017-09-03 DIAGNOSIS — K5730 Diverticulosis of large intestine without perforation or abscess without bleeding: Secondary | ICD-10-CM | POA: Diagnosis present

## 2017-09-03 DIAGNOSIS — D62 Acute posthemorrhagic anemia: Secondary | ICD-10-CM | POA: Diagnosis present

## 2017-09-03 DIAGNOSIS — K2980 Duodenitis without bleeding: Secondary | ICD-10-CM | POA: Diagnosis present

## 2017-09-03 DIAGNOSIS — K6389 Other specified diseases of intestine: Secondary | ICD-10-CM | POA: Diagnosis present

## 2017-09-03 DIAGNOSIS — D649 Anemia, unspecified: Secondary | ICD-10-CM | POA: Diagnosis present

## 2017-09-03 DIAGNOSIS — K222 Esophageal obstruction: Secondary | ICD-10-CM | POA: Diagnosis present

## 2017-09-03 DIAGNOSIS — D631 Anemia in chronic kidney disease: Secondary | ICD-10-CM | POA: Diagnosis present

## 2017-09-03 DIAGNOSIS — K921 Melena: Secondary | ICD-10-CM | POA: Diagnosis present

## 2017-09-03 DIAGNOSIS — E7800 Pure hypercholesterolemia, unspecified: Secondary | ICD-10-CM | POA: Diagnosis present

## 2017-09-03 DIAGNOSIS — E1122 Type 2 diabetes mellitus with diabetic chronic kidney disease: Secondary | ICD-10-CM | POA: Diagnosis present

## 2017-09-03 DIAGNOSIS — I120 Hypertensive chronic kidney disease with stage 5 chronic kidney disease or end stage renal disease: Secondary | ICD-10-CM | POA: Diagnosis present

## 2017-09-03 DIAGNOSIS — Z85038 Personal history of other malignant neoplasm of large intestine: Secondary | ICD-10-CM | POA: Diagnosis not present

## 2017-09-03 DIAGNOSIS — N186 End stage renal disease: Secondary | ICD-10-CM | POA: Diagnosis present

## 2017-09-03 HISTORY — PX: ESOPHAGOGASTRODUODENOSCOPY (EGD) WITH PROPOFOL: SHX5813

## 2017-09-03 HISTORY — PX: BIOPSY: SHX5522

## 2017-09-03 LAB — BPAM RBC
Blood Product Expiration Date: 201909232359
ISSUE DATE / TIME: 201908290701
UNIT TYPE AND RH: 9500

## 2017-09-03 LAB — CBC
HEMATOCRIT: 25.3 % — AB (ref 36.0–46.0)
HEMOGLOBIN: 8.1 g/dL — AB (ref 12.0–15.0)
MCH: 26.9 pg (ref 26.0–34.0)
MCHC: 32 g/dL (ref 30.0–36.0)
MCV: 84.1 fL (ref 78.0–100.0)
Platelets: 220 10*3/uL (ref 150–400)
RBC: 3.01 MIL/uL — ABNORMAL LOW (ref 3.87–5.11)
RDW: 15.7 % — AB (ref 11.5–15.5)
WBC: 6.2 10*3/uL (ref 4.0–10.5)

## 2017-09-03 LAB — TYPE AND SCREEN
ABO/RH(D): O NEG
Antibody Screen: NEGATIVE
Unit division: 0

## 2017-09-03 SURGERY — ESOPHAGOGASTRODUODENOSCOPY (EGD) WITH PROPOFOL
Anesthesia: Monitor Anesthesia Care

## 2017-09-03 MED ORDER — PROPOFOL 500 MG/50ML IV EMUL
INTRAVENOUS | Status: DC | PRN
  Administered 2017-09-03: 75 ug/kg/min via INTRAVENOUS

## 2017-09-03 MED ORDER — PROPOFOL 10 MG/ML IV BOLUS
INTRAVENOUS | Status: DC | PRN
  Administered 2017-09-03: 25 mg via INTRAVENOUS
  Administered 2017-09-03: 20 mg via INTRAVENOUS

## 2017-09-03 MED ORDER — SODIUM CHLORIDE 0.9 % IV SOLN
INTRAVENOUS | Status: DC
  Administered 2017-09-03: 12:00:00 via INTRAVENOUS

## 2017-09-03 MED ORDER — PEG 3350-KCL-NA BICARB-NACL 420 G PO SOLR
4000.0000 mL | Freq: Once | ORAL | Status: AC
  Administered 2017-09-03: 4000 mL via ORAL
  Filled 2017-09-03 (×2): qty 4000

## 2017-09-03 MED ORDER — CIPROFLOXACIN HCL 500 MG PO TABS
500.0000 mg | ORAL_TABLET | Freq: Once | ORAL | Status: AC
  Administered 2017-09-04: 500 mg via ORAL
  Filled 2017-09-03: qty 1

## 2017-09-03 MED ORDER — MUPIROCIN 2 % EX OINT
1.0000 "application " | TOPICAL_OINTMENT | Freq: Three times a day (TID) | CUTANEOUS | Status: DC
  Administered 2017-09-03 – 2017-09-04 (×3): 1 via TOPICAL
  Filled 2017-09-03 (×2): qty 22

## 2017-09-03 MED ORDER — METRONIDAZOLE 500 MG PO TABS
500.0000 mg | ORAL_TABLET | Freq: Once | ORAL | Status: AC
  Administered 2017-09-04: 500 mg via ORAL
  Filled 2017-09-03: qty 1

## 2017-09-03 MED ORDER — LIDOCAINE 2% (20 MG/ML) 5 ML SYRINGE
INTRAMUSCULAR | Status: DC | PRN
  Administered 2017-09-03: 40 mg via INTRAVENOUS

## 2017-09-03 SURGICAL SUPPLY — 15 items

## 2017-09-03 NOTE — Brief Op Note (Signed)
09/01/2017 - 09/03/2017  1:42 PM  PATIENT:  Patricia [name]  42 y.o. female  PRE-OPERATIVE DIAGNOSIS:  Melena  POST-OPERATIVE DIAGNOSIS:  Gastric biopsy taken to rule out H.pylori  PROCEDURE:  Procedure(s): ESOPHAGOGASTRODUODENOSCOPY (EGD) WITH PROPOFOL (N/A) BIOPSY  SURGEON:  Surgeon(s) and Role:    * Amiree No, MD - Primary  Findings ------------- - EGD negative for acute bleeding. It showed lower esophageal ring, mild gastritis and mild duodenitis. Normal-appearing esophagus.  Recommendations ----------------------- - Colonoscopy  tomorrow - Continue to have clear liquids - DC Diflucan - Monitor H&H. Transfuse as needed. - GI will follow  Otis Brace MD, [place] 09/03/2017, 1:44 PM  Contact #  [phone]

## 2017-09-03 NOTE — Anesthesia Procedure Notes (Signed)
Procedure Name: MAC Date/Time: 09/03/2017 1:09 PM Performed by: Colin Benton, CRNA Pre-anesthesia Checklist: Patient identified, Emergency Drugs available, Patient being monitored and Suction available Patient Re-evaluated:Patient Re-evaluated prior to induction Oxygen Delivery Method: Nasal cannula Preoxygenation: Pre-oxygenation with 100% oxygen Induction Type: IV induction Placement Confirmation: positive ETCO2 Dental Injury: Teeth and Oropharynx as per pre-operative assessment

## 2017-09-03 NOTE — Anesthesia Postprocedure Evaluation (Signed)
Anesthesia Post Note  Patient: Patricia [name]  Procedure(s) Performed: ESOPHAGOGASTRODUODENOSCOPY (EGD) WITH PROPOFOL (N/A ) BIOPSY     Patient location during evaluation: PACU Anesthesia Type: MAC Level of consciousness: awake and alert Pain management: pain level controlled Vital Signs Assessment: post-procedure vital signs reviewed and stable Respiratory status: spontaneous breathing, nonlabored ventilation, respiratory function stable and patient connected to nasal cannula oxygen Cardiovascular status: stable and blood pressure returned to baseline Postop Assessment: no apparent nausea or vomiting Anesthetic complications: no    Last Vitals:  Vitals:   09/03/17 1330 09/03/17 1340  BP: (!) 144/82 (!) 164/90  Pulse: 77 69  Resp: (!) 21 19  Temp:    SpO2: 100% 100%    Last Pain:  Vitals:   09/03/17 1340  TempSrc:   PainSc: 0-No pain                 Laron Angelini S

## 2017-09-03 NOTE — Progress Notes (Signed)
PROGRESS NOTE    Patricia [name]  EXH:[id number] DOB: 10-22-1975 DOA: 09/01/2017 PCP: Blair Heys, PA-C   Brief Narrative:  42 year old female with a history of ESRD on peritoneal dialysis, with last session on 09/01/2017, anxiety, hypertension, recent history of esophageal candidiasis treated with Diflucan, brought to the emergency department due complains of dark stools, and symptomatic anemia as well as recurrence of her dysphagia.  On outpatient blood work she was found to have a hemoglobin of 7.9.down from 8.2 on 08/17/2017.  BUN was 49.  She had normal potassium and bicarbonate.  Patient had a Hemoccult positive.  Type and screen was performed, GI was consulted, who recommended admission.     Assessment & Plan:   Principal Problem:   Melena Active Problems:   HTN (hypertension)   Normocytic anemia   ESRD (end stage renal disease) (HCC)   Dysphagia   Esophageal candidiasis (HCC)   Anxiety   Symptomatic anemia, with dark stools, and occult positive.   -s/p PRBCs with improvement -EGD done: EGD negative for acute bleeding. It showed lower esophageal ring, mild gastritis and mild duodenitis. Normal-appearing esophagus. -colonoscopy in AM per GI  Esophageal candidiasis.   D/c diflucan as EGD shows clearing  ESRD, on peritoneal dialysis at home.   -getting PD Here   Anxiety Continue Ativan as needed every 6 hours  Hypertension BP -PRNs  DVT prophylaxis: SCD Code Status: Full code  Family Communication: Discussed with patient Disposition Plan: To home, when stable.  Consultants:  GI nephrology   Procedures: PD EGD 8/30 Colonoscopy 8/31   Subjective: Thinks she is feeling better but NOT back to her normal self. Awaiting EGD this AM Has walked to the bathroom with assistance  Objective: Vitals:   09/03/17 1127 09/03/17 1328 09/03/17 1330 09/03/17 1340  BP: (!) 145/85 (!) 144/82 (!) 144/82 (!) 164/90  Pulse: 70  77 69  Resp: 19  (!) 21 19  Temp: 98.3 F  (36.8 C) 98.8 F (37.1 C)    TempSrc: Oral Oral    SpO2: 100%  100% 100%  Weight:      Height:        Intake/Output Summary (Last 24 hours) at 09/03/2017 1500 Last data filed at 09/03/2017 1334 Gross per 24 hour  Intake 10450.32 ml  Output 10537 ml  Net -86.68 ml   Filed Weights   09/02/17 0233 09/02/17 0600 09/02/17 1840  Weight: 114.7 kg 131.4 kg 111 kg    Examination:  General exam: soft spoken, chronically ill appearing Respiratory system: no increased work of breathing Cardiovascular system: rrr Gastrointestinal system: getting PD Central nervous system: alert and oriented Extremities: moves all 4 ext Skin: no rash, dried scabs on right cheek     Data Reviewed: I have personally reviewed following labs and imaging studies  CBC: Recent Labs  Lab 09/01/17 2009 09/02/17 0524 09/02/17 1018 09/03/17 0340  WBC 8.3 6.7  --  6.2  HGB 7.9* 6.8* 8.5* 8.1*  HCT 25.7* 21.6* 26.1* 25.3*  MCV 86.0 84.4  --  84.1  PLT 315 236  --  381   Basic Metabolic Panel: Recent Labs  Lab 09/01/17 2009 09/02/17 0524  NA 138 136  K 4.1 4.0  CL 95* 96*  CO2 24 24  GLUCOSE 99 85  BUN 49* 57*  CREATININE 15.18* 16.34*  CALCIUM 7.9* 7.3*   GFR: Estimated Creatinine Clearance: 5.6 mL/min (A) (by C-G formula based on SCr of 16.34 mg/dL (H)). Liver Function Tests: No results for input(s):  AST, ALT, ALKPHOS, BILITOT, PROT, ALBUMIN in the last 168 hours. No results for input(s): LIPASE, AMYLASE in the last 168 hours. No results for input(s): AMMONIA in the last 168 hours. Coagulation Profile: No results for input(s): INR, PROTIME in the last 168 hours. Cardiac Enzymes: No results for input(s): CKTOTAL, CKMB, CKMBINDEX, TROPONINI in the last 168 hours. BNP (last 3 results) No results for input(s): PROBNP in the last 8760 hours. HbA1C: No results for input(s): HGBA1C in the last 72 hours. CBG: No results for input(s): GLUCAP in the last 168 hours. Lipid Profile: No results  for input(s): CHOL, HDL, LDLCALC, TRIG, CHOLHDL, LDLDIRECT in the last 72 hours. Thyroid Function Tests: No results for input(s): TSH, T4TOTAL, FREET4, T3FREE, THYROIDAB in the last 72 hours. Anemia Panel: No results for input(s): VITAMINB12, FOLATE, FERRITIN, TIBC, IRON, RETICCTPCT in the last 72 hours. Sepsis Labs: No results for input(s): PROCALCITON, LATICACIDVEN in the last 168 hours.  No results found for this or any previous visit (from the past 240 hour(s)).       Radiology Studies: Dg Chest 2 View  Result Date: 09/01/2017 CLINICAL DATA:  Fatigue, dizziness, shortness of breath EXAM: CHEST - 2 VIEW COMPARISON:  08/14/2017 FINDINGS: Mild cardiomegaly. Low lung volumes. No confluent opacities or effusions. No acute bony abnormality. Lucency noted adjacent to the right hemidiaphragm. Cannot exclude small amount of free air under the right hemidiaphragm. Recommend correlation for abdominal pain. IMPRESSION: No acute cardiopulmonary disease.  Low lung volumes. Questionable pneumoperitoneum under the right hemidiaphragm. Recommend clinical correlation. Electronically Signed   By: Rolm Baptise M.D.   On: 09/01/2017 22:41        Scheduled Meds: . gentamicin cream  1 application Topical Daily  . mupirocin ointment  1 application Topical TID  . pantoprazole  40 mg Intravenous Q12H  . sodium chloride flush  3 mL Intravenous Q12H   Continuous Infusions: . sodium chloride       LOS: 0 days    Time spent:    Geradine Girt, DO [facility]   If 7PM-7AM, please contact night-coverage [email or web address] Password [email or web address] 09/03/2017, 3:00 PM

## 2017-09-03 NOTE — Procedures (Signed)
I reviewed the patient's peritoneal dialysis session and made  appropriate changes. UF 310mL overnight.  Hold PD tonight while prepping for colonoscopy tomorrow. Prophylactic abx tomorrow AM.   Jannifer Hick MD [facility] pager [phone]   09/03/2017, 4:04 PM

## 2017-09-03 NOTE — Anesthesia Preprocedure Evaluation (Signed)
Anesthesia Evaluation  Patient identified by MRN, date of birth, ID band Patient awake    Reviewed: Allergy & Precautions, NPO status , Patient's Chart, lab work & pertinent test results  Airway Mallampati: II  TM Distance: >3 FB Neck ROM: Full    Dental no notable dental hx.    Pulmonary neg pulmonary ROS,    Pulmonary exam normal breath sounds clear to auscultation       Cardiovascular hypertension, Normal cardiovascular exam Rhythm:Regular Rate:Normal     Neuro/Psych negative neurological ROS  negative psych ROS   GI/Hepatic negative GI ROS, Neg liver ROS,   Endo/Other  Morbid obesity  Renal/GU Renal disease  negative genitourinary   Musculoskeletal negative musculoskeletal ROS (+)   Abdominal   Peds negative pediatric ROS (+)  Hematology  (+) anemia ,   Anesthesia Other Findings   Reproductive/Obstetrics negative OB ROS                             Anesthesia Physical Anesthesia Plan  ASA: IV  Anesthesia Plan: MAC   Post-op Pain Management:    Induction: Intravenous  PONV Risk Score and Plan: 0  Airway Management Planned: Simple Face Mask  Additional Equipment:   Intra-op Plan:   Post-operative Plan:   Informed Consent: I have reviewed the patients History and Physical, chart, labs and discussed the procedure including the risks, benefits and alternatives for the proposed anesthesia with the patient or authorized representative who has indicated his/her understanding and acceptance.   Dental advisory given  Plan Discussed with: CRNA and Surgeon  Anesthesia Plan Comments:         Anesthesia Quick Evaluation

## 2017-09-03 NOTE — Interval H&P Note (Signed)
History and Physical Interval Note:  09/03/2017 12:43 PM  Patricia [name]  has presented today for surgery, with the diagnosis of Melena  The various methods of treatment have been discussed with the patient and family. After consideration of risks, benefits and other options for treatment, the patient has consented to  Procedure(s): ESOPHAGOGASTRODUODENOSCOPY (EGD) WITH PROPOFOL (N/A) as a surgical intervention .  The patient's history has been reviewed, patient examined, no change in status, stable for surgery.  I have reviewed the patient's chart and labs.  Questions were answered to the patient's satisfaction.     Anthem Frazer

## 2017-09-03 NOTE — Progress Notes (Signed)
  [facility] Progress Note   Subjective:  Seen in room, finishing up with CCPD. No CP. Did have a little nausea and SOB overnight - resolved now. Plan is for EGD today. Energy improved s/p 1U PRBCs yesterday.  Objective Vitals:   09/02/17 1452 09/02/17 1840 09/03/17 0534 09/03/17 0936  BP: 132/72 (!) 149/82 130/81 (!) 146/84  Pulse: 63 68 81 68  Resp: $Remo'18 16 18 20  'SODgW$ Temp: 98.1 F (36.7 C) 98.4 F (36.9 C) 98.4 F (36.9 C) 98.5 F (36.9 C)  TempSrc: Oral Oral  Oral  SpO2: 99% 98% 99% 98%  Weight:  111 kg    Height:       Physical Exam General: Well appearing woman. NAD, but sleepy. Heart: RRR; no murmur Lungs: CTA anteriorly Abdomen: soft, in PD drain cycle currently but + distension. Extremities: No LE edema Dialysis Access: PD cath in abdomen, old LUE AVF still patent  Additional Objective Labs: Basic Metabolic Panel: Recent Labs  Lab 09/01/17 2009 09/02/17 0524  NA 138 136  K 4.1 4.0  CL 95* 96*  CO2 24 24  GLUCOSE 99 85  BUN 49* 57*  CREATININE 15.18* 16.34*  CALCIUM 7.9* 7.3*   CBC: Recent Labs  Lab 09/01/17 2009 09/02/17 0524 09/02/17 1018 09/03/17 0340  WBC 8.3 6.7  --  6.2  HGB 7.9* 6.8* 8.5* 8.1*  HCT 25.7* 21.6* 26.1* 25.3*  MCV 86.0 84.4  --  84.1  PLT 315 236  --  220   Studies/Results: Dg Chest 2 View  Result Date: 09/01/2017 CLINICAL DATA:  Fatigue, dizziness, shortness of breath EXAM: CHEST - 2 VIEW COMPARISON:  08/14/2017 FINDINGS: Mild cardiomegaly. Low lung volumes. No confluent opacities or effusions. No acute bony abnormality. Lucency noted adjacent to the right hemidiaphragm. Cannot exclude small amount of free air under the right hemidiaphragm. Recommend correlation for abdominal pain. IMPRESSION: No acute cardiopulmonary disease.  Low lung volumes. Questionable pneumoperitoneum under the right hemidiaphragm. Recommend clinical correlation. Electronically Signed   By: Rolm Baptise M.D.   On: 09/01/2017 22:41    Medications: . sodium chloride    . fluconazole (DIFLUCAN) IV 100 mg (09/02/17 2207)   . gentamicin cream  1 application Topical Daily  . mupirocin ointment  1 application Topical TID  . pantoprazole  40 mg Intravenous Q12H  . sodium chloride flush  3 mL Intravenous Q12H   Dialysis Orders: CCPD 7X Week, EDW 108.5 (kg) Ca 2.5 (mEq/L) Mg 0.5 (mEq/L) Dextrose 1.5; 2.5; 4.25 %, 5 # Exchanges  60fillsvol 3000 mL, Dwell Time 2 hourslast fill vol 3000 mL, Day Exchanges 1, daytime fill vol 3000 mL, Day Dwell Time 4 hrsDialysis   Assessment/Plan: 1. Melena/symptomatic anemia: FOBT +. On IV Protonix. GI consulted, for EGD today. S/p 1U PRBCs and Aranesp 247mcg on 8/29. 2. ESRD: Continue CCPD nightly - for EGD today so may have to skip day exchange. No symptoms of peritonitis.  3. HTN/volume: BP ok, no edema. CXR clear, no edema. Will assess weights and UF once PD concluded today and change if needed. All 1.5% PD fluid overnight, NPO for EGD today - likely will keep orders same tonight and then change to 2.5% tomorrow once eating better. 4. Secondary hyperparathyroidism: Ca low. Alb/Phos pending. Follow. 5. Recent esophageal candidiasis: Finishing course of IV Fluconazole.  Veneta Penton, PA-C 09/03/2017, 10:39 AM  [facility] Pager: [phone]

## 2017-09-03 NOTE — Transfer of Care (Signed)
Immediate Anesthesia Transfer of Care Note  Patient: Patricia [name]  Procedure(s) Performed: ESOPHAGOGASTRODUODENOSCOPY (EGD) WITH PROPOFOL (N/A ) BIOPSY  Patient Location: Endoscopy Unit  Anesthesia Type:MAC  Level of Consciousness: drowsy  Airway & Oxygen Therapy: Patient Spontanous Breathing and Patient connected to nasal cannula oxygen  Post-op Assessment: Report given to RN and Post -op Vital signs reviewed and stable  Post vital signs: Reviewed and stable  Last Vitals:  Vitals Value Taken Time  BP 144/82 09/03/2017  1:31 PM  Temp 37.1 C 09/03/2017  1:28 PM  Pulse 73 09/03/2017  1:34 PM  Resp 24 09/03/2017  1:34 PM  SpO2 100 % 09/03/2017  1:34 PM  Vitals shown include unvalidated device data.  Last Pain:  Vitals:   09/03/17 1330  TempSrc:   PainSc: 0-No pain         Complications: No apparent anesthesia complications

## 2017-09-03 NOTE — Op Note (Signed)
[facility] [name] Procedure Date : 09/03/2017 MRN: [id number] Attending MD: Otis Brace , MD Date of Birth: 1975/01/18 CSN: [id number] Age: 42 Admit Type: Inpatient Procedure:                Upper GI endoscopy Indications:              Melena, Suspected upper gastrointestinal bleeding Providers:                Otis Brace, MD, Baird Cancer, RN, Charolette Child, Technician Referring MD:              Medicines:                Sedation Administered by an Anesthesia Professional Complications:            No immediate complications. Estimated Blood Loss:     Estimated blood loss was minimal. Procedure:                Pre-Anesthesia Assessment:                           - Prior to the procedure, a History and Physical                            was performed, and patient medications and                            allergies were reviewed. The patient's tolerance of                            previous anesthesia was also reviewed. The risks                            and benefits of the procedure and the sedation                            options and risks were discussed with the patient.                            All questions were answered, and informed consent                            was obtained. Prior Anticoagulants: The patient has                            taken no previous anticoagulant or antiplatelet                            agents. ASA Grade Assessment: IV - A patient with                            severe systemic disease that is a constant threat  to life. After reviewing the risks and benefits,                            the patient was deemed in satisfactory condition to                            undergo the procedure.                           After obtaining informed consent, the endoscope was                            passed under direct vision. Throughout the                       procedure, the patient's blood pressure, pulse, and                            oxygen saturations were monitored continuously. The                            GIF-H190 ([id number]) Olympus adult EGD was introduced                            through the mouth, and advanced to the second part                            of duodenum. The upper GI endoscopy was                            accomplished without difficulty. The patient                            tolerated the procedure well. Scope In: Scope Out: Findings:      Esophagogastric landmarks were identified: the gastroesophageal junction       was found at 31 cm from the incisors.      A non-obstructing Schatzki ring was found at the gastroesophageal       junction.      A small hiatal hernia was present.      Abnormal motility was noted in the esophagus.      Localized mild inflammation characterized by congestion (edema) and       erythema was found in the prepyloric region of the stomach. Biopsies       were taken with a cold forceps for histology.      Scattered mild inflammation was found in the duodenal bulb.      The second portion of the duodenum was normal. Impression:               - Esophagogastric landmarks identified.                           - Non-obstructing Schatzki ring.                           - Small hiatal hernia.                           -  Abnormal esophageal motility.                           - Gastritis. Biopsied.                           - Duodenitis.                           - Normal second portion of the duodenum. Recommendation:           - Return patient to hospital ward for ongoing care.                           - Clear liquid diet.                           - Continue present medications.                           - Perform a colonoscopy tomorrow. Procedure Code(s):        --- Professional ---                           [phone], Esophagogastroduodenoscopy, flexible,                             transoral; with biopsy, single or multiple Diagnosis Code(s):        --- Professional ---                           K22.2, Esophageal obstruction                           K22.4, Dyskinesia of esophagus                           K29.70, Gastritis, unspecified, without bleeding                           K29.80, Duodenitis without bleeding                           K92.1, Melena (includes Hematochezia) CPT copyright 2017 [employer]. All rights reserved. The codes documented in this report are preliminary and upon coder review may  be revised to meet current compliance requirements. Otis Brace, MD Otis Brace, MD 09/03/2017 1:38:47 PM Number of Addenda: 0

## 2017-09-04 ENCOUNTER — Encounter (HOSPITAL_COMMUNITY): Payer: Self-pay

## 2017-09-04 ENCOUNTER — Inpatient Hospital Stay (HOSPITAL_COMMUNITY): Payer: Medicare Other | Admitting: Anesthesiology

## 2017-09-04 ENCOUNTER — Encounter (HOSPITAL_COMMUNITY)
Admission: EM | Disposition: A | Discharge status: Home / Self Care | Payer: Self-pay | Source: Home / Self Care | Attending: Internal Medicine

## 2017-09-04 DIAGNOSIS — K921 Melena: Principal | ICD-10-CM

## 2017-09-04 HISTORY — PX: POLYPECTOMY: SHX5525

## 2017-09-04 HISTORY — PX: COLONOSCOPY WITH PROPOFOL: SHX5780

## 2017-09-04 LAB — BASIC METABOLIC PANEL
ANION GAP: 17 — AB (ref 5–15)
BUN: 56 mg/dL — AB (ref 6–20)
CO2: 23 mmol/L (ref 22–32)
Calcium: 7.5 mg/dL — ABNORMAL LOW (ref 8.9–10.3)
Chloride: 95 mmol/L — ABNORMAL LOW (ref 98–111)
Creatinine, Ser: 15.74 mg/dL — ABNORMAL HIGH (ref 0.44–1.00)
GFR, EST AFRICAN AMERICAN: 3 mL/min — AB (ref >60)
GFR, EST NON AFRICAN AMERICAN: 2 mL/min — AB (ref >60)
Glucose, Bld: 98 mg/dL (ref 70–99)
POTASSIUM: 3.8 mmol/L (ref 3.5–5.1)
SODIUM: 135 mmol/L (ref 135–145)

## 2017-09-04 LAB — CBC
HCT: 24.9 % — ABNORMAL LOW (ref 36.0–46.0)
Hemoglobin: 8 g/dL — ABNORMAL LOW (ref 12.0–15.0)
MCH: 27.4 pg (ref 26.0–34.0)
MCHC: 32.1 g/dL (ref 30.0–36.0)
MCV: 85.3 fL (ref 78.0–100.0)
PLATELETS: 232 10*3/uL (ref 150–400)
RBC: 2.92 MIL/uL — AB (ref 3.87–5.11)
RDW: 16.1 % — ABNORMAL HIGH (ref 11.5–15.5)
WBC: 6.3 10*3/uL (ref 4.0–10.5)

## 2017-09-04 SURGERY — COLONOSCOPY WITH PROPOFOL
Anesthesia: Monitor Anesthesia Care

## 2017-09-04 MED ORDER — PANTOPRAZOLE SODIUM 40 MG PO TBEC
40.0000 mg | DELAYED_RELEASE_TABLET | Freq: Two times a day (BID) | ORAL | Status: DC
  Filled 2017-09-04: qty 1

## 2017-09-04 MED ORDER — PROPOFOL 500 MG/50ML IV EMUL
INTRAVENOUS | Status: DC | PRN
  Administered 2017-09-04: 100 ug/kg/min via INTRAVENOUS

## 2017-09-04 MED ORDER — LISINOPRIL 20 MG PO TABS: 20.0000 mg | ORAL_TABLET | Freq: Every day | ORAL | Status: DC

## 2017-09-04 MED ORDER — METOPROLOL TARTRATE 25 MG PO TABS
25.0000 mg | ORAL_TABLET | Freq: Every morning | ORAL | Status: DC
  Administered 2017-09-04: 25 mg via ORAL
  Filled 2017-09-04: qty 1

## 2017-09-04 MED ORDER — SODIUM CHLORIDE 0.9 % IV SOLN
INTRAVENOUS | Status: DC
  Administered 2017-09-04 (×2): via INTRAVENOUS

## 2017-09-04 MED ORDER — PROPOFOL 10 MG/ML IV BOLUS
INTRAVENOUS | Status: DC | PRN
  Administered 2017-09-04 (×10): 20 mg via INTRAVENOUS

## 2017-09-04 MED ORDER — PANTOPRAZOLE SODIUM 40 MG IV SOLR
40.0000 mg | Freq: Once | INTRAVENOUS | Status: AC
  Administered 2017-09-04: 40 mg via INTRAVENOUS
  Filled 2017-09-04: qty 40

## 2017-09-04 MED ORDER — PANTOPRAZOLE SODIUM 40 MG PO TBEC: 40.0000 mg | DELAYED_RELEASE_TABLET | Freq: Every day | ORAL | 0 refills | Status: DC

## 2017-09-04 SURGICAL SUPPLY — 21 items

## 2017-09-04 NOTE — Anesthesia Postprocedure Evaluation (Signed)
Anesthesia Post Note  Patient: Patricia [name]  Procedure(s) Performed: COLONOSCOPY WITH PROPOFOL (N/A ) POLYPECTOMY     Patient location during evaluation: PACU Anesthesia Type: MAC Level of consciousness: awake and alert, awake and oriented Pain management: pain level controlled Vital Signs Assessment: post-procedure vital signs reviewed and stable Respiratory status: spontaneous breathing, nonlabored ventilation and respiratory function stable Cardiovascular status: stable and blood pressure returned to baseline Postop Assessment: no apparent nausea or vomiting Anesthetic complications: no    Last Vitals:  Vitals:   09/04/17 0833 09/04/17 0856  BP: (!) 163/85 (!) 176/99  Pulse: 70 70  Resp: 18 19  Temp:  36.6 C  SpO2: 100% 100%    Last Pain:  Vitals:   09/04/17 0856  TempSrc: Oral  PainSc:                  Catalina Gravel

## 2017-09-04 NOTE — Discharge Summary (Addendum)
Physician Discharge Summary  Patricia [name] WUX:[id number] DOB: May 21, 1975 DOA: 09/01/2017  PCP: Blair Heys, PA-C  Admit date: 09/01/2017 Discharge date: 09/04/2017  Admitted From: Home Disposition:  Home  Discharge Condition:Stable CODE STATUS:FULL Diet recommendation: Renal   Brief/Interim Summary:  Patient is a 42 year old female with a history of ESRD on peritoneal dialysis, with last session on 09/01/2017, anxiety, hypertension, recent history of esophageal candidiasis treated with Diflucan, brought to the emergency department due complains of dark stools, and symptomatic anemia as well as recurrence of her dysphagia.  On outpatient blood work she was found to have a hemoglobin of 7.9.down from 8.2 on 08/17/2017.  BUN was 49.  She had normal potassium and bicarbonate.  Patient had a Hemoccult positive.  Type and screen was performed, GI was consulted, who recommended admission.  Patient's hospital course remained stable.  She was evaluated by GI and she underwent EGD and colonoscopy without any significant evidence of bleeding.  Her hemoglobin this morning is stable at 8.  Patient was cleared from GI for discharge.  She is being discharged home today.  Will recommend to check CBC in a week.  Following problems were addressed during her hospitalization:  Symptomatic anemia, with dark stools, and occult positive.   -s/p PRBCs with improvement -EGD done: EGD negative for acute bleeding. It showed lower esophageal ring, mild gastritis and mild duodenitis. Normal-appearing esophagus. -Colonoscopy showed no active bleeding, 2 polyps which were removed by biopsy polypectomy.  Esophageal candidiasis.   D/c diflucan as EGD shows clearing  ESRD, on peritoneal dialysis at home.   -was evaluated by nephrology here.   Anxiety Continue home meds  Hypertension BP Continue home meds  Discharge Diagnoses:  Principal Problem:   Melena Active Problems:   HTN (hypertension)   Normocytic  anemia   ESRD (end stage renal disease) (HCC)   Dysphagia   Esophageal candidiasis (HCC)   Anxiety    Discharge Instructions  Discharge Instructions    Diet - low sodium heart healthy   Complete by:  As directed    Discharge instructions   Complete by:  As directed    1)Follow up with your PCP in a week.  Do a CBC test during the follow-up.   Increase activity slowly   Complete by:  As directed      Allergies as of 09/04/2017      Reactions   Nsaids Other (See Comments)   Cannot take due to Kidney disease, Kidney disease, Kidney function   Tolmetin Other (See Comments)   Cannot take due to Kidney Disease      Medication List    STOP taking these medications   fluconazole 200 MG tablet Commonly known as:  DIFLUCAN   omeprazole 40 MG capsule Commonly known as:  PRILOSEC     TAKE these medications   allopurinol 100 MG tablet Commonly known as:  ZYLOPRIM Take 100 mg by mouth at bedtime.   ALPRAZolam 1 MG tablet Commonly known as:  XANAX Take 1 mg by mouth 2 (two) times daily as needed for anxiety.   aspirin 81 MG chewable tablet Chew 1 tablet (81 mg total) by mouth daily. What changed:  when to take this   atorvastatin 40 MG tablet Commonly known as:  LIPITOR Take 40 mg by mouth at bedtime.   calcitRIOL 0.5 MCG capsule Commonly known as:  ROCALTROL Take 1 capsule (0.5 mcg total) by mouth daily with breakfast. What changed:  when to take this   cinacalcet 30 MG  tablet Commonly known as:  SENSIPAR Take 1 tablet (30 mg total) by mouth daily.   diphenhydramine-acetaminophen 25-500 MG Tabs tablet Commonly known as:  TYLENOL PM Take 2 tablets by mouth at bedtime.   fluticasone 50 MCG/ACT nasal spray Commonly known as:  FLONASE Place 1 spray into both nostrils daily as needed for allergies or rhinitis.   gentamicin cream 0.1 % Commonly known as:  GARAMYCIN Apply 1 application topically daily.   lisinopril 20 MG tablet Commonly known as:   PRINIVIL,ZESTRIL Take 20 mg by mouth at bedtime.   metoprolol tartrate 25 MG tablet Commonly known as:  LOPRESSOR Take 25 mg by mouth daily as needed (for high BP > 140).   multivitamin Tabs tablet Take 1 tablet by mouth at bedtime.   mupirocin ointment 2 % Commonly known as:  BACTROBAN Apply 1 application topically 3 (three) times daily.   ondansetron 8 MG tablet Commonly known as:  ZOFRAN Take 8 mg by mouth every 8 (eight) hours as needed for nausea or vomiting.   oxyCODONE-acetaminophen 5-325 MG tablet Commonly known as:  PERCOCET/ROXICET Take 1-2 tablets by mouth every 6 (six) hours as needed for severe pain.   pantoprazole 40 MG tablet Commonly known as:  PROTONIX Take 1 tablet (40 mg total) by mouth daily.   promethazine 12.5 MG tablet Commonly known as:  PHENERGAN Take 12.5 mg by mouth every 6 (six) hours as needed for nausea or vomiting.   senna 8.6 MG Tabs tablet Commonly known as:  SENOKOT Take 2 tablets (17.2 mg total) by mouth at bedtime.   VELPHORO 500 MG chewable tablet Generic drug:  sucroferric oxyhydroxide Chew 500 mg by mouth 3 (three) times daily with meals.   venlafaxine XR 75 MG 24 hr capsule Commonly known as:  EFFEXOR-XR Take 75 mg by mouth at bedtime.      Follow-up Information    Long, Caryl Pina, [place]. Schedule an appointment as soon as possible for a visit in 1 week(s).   Specialty:  Physician Assistant Contact information: [address] [place]  [place] [address] [phone]          Allergies  Allergen Reactions  . Nsaids Other (See Comments)    Cannot take due to Kidney disease, Kidney disease, Kidney function   . Tolmetin Other (See Comments)    Cannot take due to Kidney Disease    Consultations:  Nephrology, GI   Procedures/Studies: Dg Chest 2 View  Result Date: 09/01/2017 CLINICAL DATA:  Fatigue, dizziness, shortness of breath EXAM: CHEST - 2 VIEW COMPARISON:  08/14/2017 FINDINGS: Mild cardiomegaly. Low lung  volumes. No confluent opacities or effusions. No acute bony abnormality. Lucency noted adjacent to the right hemidiaphragm. Cannot exclude small amount of free air under the right hemidiaphragm. Recommend correlation for abdominal pain. IMPRESSION: No acute cardiopulmonary disease.  Low lung volumes. Questionable pneumoperitoneum under the right hemidiaphragm. Recommend clinical correlation. Electronically Signed   By: Rolm Baptise M.D.   On: 09/01/2017 22:41   Dg Chest 2 View  Result Date: 08/14/2017 CLINICAL DATA:  Pt complains of mid-left chest pain that comes and goes, worsening since yesterday. Pt states she is unable to fully lift L arm. Pt hx HTN. Nonsmoker EXAM: CHEST - 2 VIEW COMPARISON:  04/04/2017 FINDINGS: The heart size and mediastinal contours are within normal limits. Both lungs are clear. No pleural effusion or pneumothorax. The visualized skeletal structures are unremarkable. IMPRESSION: No active cardiopulmonary disease. Electronically Signed   By: Dedra Skeens.D.  On: 08/14/2017 18:18   Ct Chest W Contrast  Result Date: 08/15/2017 CLINICAL DATA:  Acute onset of difficulty swallowing and epigastric abdominal pain. EXAM: CT CHEST WITH CONTRAST TECHNIQUE: Multidetector CT imaging of the chest was performed during intravenous contrast administration. CONTRAST:  74mL OMNIPAQUE IOHEXOL 300 MG/ML  SOLN COMPARISON:  Chest radiograph performed 08/14/2017, and CTA of the chest performed 07/04/2011 FINDINGS: Cardiovascular: The heart is normal in size. The thoracic aorta is unremarkable. The great vessels are grossly unremarkable. Mediastinum/Nodes: There is diffuse wall thickening along the distal esophagus, which may reflect esophagitis. Visualized mediastinal nodes remain normal in size. No pericardial effusion is identified. The visualized portions of the thyroid gland are unremarkable. No axillary lymphadenopathy is seen. Lungs/Pleura: Minimal scarring is noted at the lower lobes. The lungs  are otherwise clear. No focal consolidation, pleural effusion or pneumothorax is seen. No masses are identified. Upper Abdomen: Trace free air is noted adjacent to the liver at the right upper quadrant. The visualized portions of the liver and spleen are otherwise unremarkable. The visualized portions of the pancreas, adrenal glands and right kidney are within normal limits. Musculoskeletal: No acute osseous abnormalities are identified. The visualized musculature is unremarkable in appearance. IMPRESSION: 1. Diffuse wall thickening along the distal esophagus, which may reflect esophagitis. Would correlate for associated symptoms. 2. Trace free air noted adjacent to the liver at the right upper quadrant. CT of the abdomen and pelvis is recommended for further evaluation, to assess for bowel perforation. 3. Minimal scarring at the lower lung lobes. Lungs otherwise clear. Critical Value/emergent results were called by telephone at the time of interpretation on 08/15/2017 at 12:55 am to Dr. Corrie Dandy, who verbally acknowledged these results. Electronically Signed   By: Garald Balding M.D.   On: 08/15/2017 00:57   Ct Abdomen Pelvis W Contrast  Result Date: 08/15/2017 CLINICAL DATA:  Acute abdominal pain. Chest pain x2 days. History of peritoneal dialysis. EXAM: CT ABDOMEN AND PELVIS WITH CONTRAST TECHNIQUE: Multidetector CT imaging of the abdomen and pelvis was performed using the standard protocol following bolus administration of intravenous contrast. CONTRAST:  71mL ISOVUE-300 IOPAMIDOL (ISOVUE-300) INJECTION 61% COMPARISON:  Normal heart size without pericardial effusion FINDINGS: Lower chest: Heart size is normal.  Clear lung bases. Hepatobiliary: Trace perihepatic dialysate with small focus of free intraperitoneal air beneath the right diaphragm similar to 06/02/2017 exam and likely related to peritoneal dialysis. No biliary dilatation. Mild fatty infiltration along the falciform. Small subcapsular cyst in  left hepatic lobe too small to further characterize measuring 8 mm.Physiologically distended gallbladder. No biliary dilatation. Faint linear density in the gallbladder may represent tiny calculus abutting what may represent some biliary sludge. Pancreas: Normal Spleen: Normal Adrenals/Urinary Tract: Normal bilateral adrenal glands. Renal mass nephrolithiasis nor obstructive uropathy. Urinary bladder is unremarkable. Stomach/Bowel: Stomach is within normal limits. Appendix appears normal. Increased colonic stool burden is noted consistent constipation with scattered colonic diverticulosis. Trace edema along the left paracolic gutter may be secondary to peritoneal dialysis. Alternatively mild descending colonic diverticulitis might account for this appearance. Vascular/Lymphatic: No significant vascular findings are present. No enlarged abdominal or pelvic lymph nodes. Reproductive: Status post hysterectomy. No adnexal masses. Other: Peritoneal dialysis catheter from left lower quadrant approach is identified with tip coiled in the mid pelvis. Small fat containing umbilical hernia. Supraumbilical fat containing hernia is also noted measuring 2.5 x 5.2 x 3.5 cm. Musculoskeletal: No acute osseous abnormality. IMPRESSION: 1. Small umbilical and supraumbilical fat containing ventral hernias. 2. Tiny focus of  free intraperitoneal gas in the right upper quadrant similar to prior CT and likely represents stigmata peritoneal dialysis. Peritoneal dialysis catheter tip is seen coiled in the mid pelvis. 3. Increased colonic stool burden. Scattered colonic diverticulosis is noted. 4. Along the descending colon is trace pericolonic edema along the left paracolic gutter that may simply reflect stigmata of peritoneal dialysis. Left-sided diverticulitis without complicating features is also possibility. 5. Faint curvilinear calcification in the gallbladder may represent a tiny stone abutting biliary sludge. Electronically Signed    By: Ashley Royalty M.D.   On: 08/15/2017 02:17      Subjective: Patient seen and examined after colonoscopy today.  Remains comfortable.  Discharge planning explained.  Discharge Exam: Vitals:   09/04/17 0833 09/04/17 0856  BP: (!) 163/85 (!) 176/99  Pulse: 70 70  Resp: 18 19  Temp:  97.8 F (36.6 C)  SpO2: 100% 100%   Vitals:   09/04/17 0647 09/04/17 0823 09/04/17 0833 09/04/17 0856  BP: (!) 151/85 (!) 144/75 (!) 163/85 (!) 176/99  Pulse: 76 75 70 70  Resp: 19 (!) $Remo'22 18 19  'YwPTv$ Temp: 98.3 F (36.8 C) 97.8 F (36.6 C)  97.8 F (36.6 C)  TempSrc: Oral Oral  Oral  SpO2: 100% 100% 100% 100%  Weight:      Height:        General: Pt is alert, awake, not in acute distress Cardiovascular: RRR, S1/S2 +, no rubs, no gallops Respiratory: CTA bilaterally, no wheezing, no rhonchi Abdominal: Soft, NT, ND, bowel sounds + Extremities: no edema, no cyanosis    The results of significant diagnostics from this hospitalization (including imaging, microbiology, ancillary and laboratory) are listed below for reference.     Microbiology: No results found for this or any previous visit (from the past 240 hour(s)).   Labs: BNP (last 3 results) No results for input(s): BNP in the last 8760 hours. Basic Metabolic Panel: Recent Labs  Lab 09/01/17 2009 09/02/17 0524 09/04/17 0640  NA 138 136 135  K 4.1 4.0 3.8  CL 95* 96* 95*  CO2 $Re'24 24 23  'rsZ$ GLUCOSE 99 85 98  BUN 49* 57* 56*  CREATININE 15.18* 16.34* 15.74*  CALCIUM 7.9* 7.3* 7.5*   Liver Function Tests: No results for input(s): AST, ALT, ALKPHOS, BILITOT, PROT, ALBUMIN in the last 168 hours. No results for input(s): LIPASE, AMYLASE in the last 168 hours. No results for input(s): AMMONIA in the last 168 hours. CBC: Recent Labs  Lab 09/01/17 2009 09/02/17 0524 09/02/17 1018 09/03/17 0340 09/04/17 0640  WBC 8.3 6.7  --  6.2 6.3  HGB 7.9* 6.8* 8.5* 8.1* 8.0*  HCT 25.7* 21.6* 26.1* 25.3* 24.9*  MCV 86.0 84.4  --  84.1 85.3   PLT 315 236  --  220 232   Cardiac Enzymes: No results for input(s): CKTOTAL, CKMB, CKMBINDEX, TROPONINI in the last 168 hours. BNP: Invalid input(s): POCBNP CBG: No results for input(s): GLUCAP in the last 168 hours. D-Dimer No results for input(s): DDIMER in the last 72 hours. Hgb A1c No results for input(s): HGBA1C in the last 72 hours. Lipid Profile No results for input(s): CHOL, HDL, LDLCALC, TRIG, CHOLHDL, LDLDIRECT in the last 72 hours. Thyroid function studies No results for input(s): TSH, T4TOTAL, T3FREE, THYROIDAB in the last 72 hours.  Invalid input(s): FREET3 Anemia work up No results for input(s): VITAMINB12, FOLATE, FERRITIN, TIBC, IRON, RETICCTPCT in the last 72 hours. Urinalysis    Component Value Date/Time   COLORURINE YELLOW 04/03/2016 1626  APPEARANCEUR HAZY (A) 04/03/2016 1626   LABSPEC 1.012 04/03/2016 1626   PHURINE 6.0 04/03/2016 1626   GLUCOSEU 150 (A) 04/03/2016 1626   HGBUR NEGATIVE 04/03/2016 1626   BILIRUBINUR NEGATIVE 04/03/2016 1626   KETONESUR NEGATIVE 04/03/2016 1626   PROTEINUR >=300 (A) 04/03/2016 1626   UROBILINOGEN 0.2 09/19/2014 1620   NITRITE NEGATIVE 04/03/2016 1626   LEUKOCYTESUR NEGATIVE 04/03/2016 1626   Sepsis Labs Invalid input(s): PROCALCITONIN,  WBC,  LACTICIDVEN Microbiology No results found for this or any previous visit (from the past 240 hour(s)).  Please note: You were cared for by a hospitalist during your hospital stay. Once you are discharged, your primary care physician will handle any further medical issues. Please note that NO REFILLS for any discharge medications will be authorized once you are discharged, as it is imperative that you return to your primary care physician (or establish a relationship with a primary care physician if you do not have one) for your post hospital discharge needs so that they can reassess your need for medications and monitor your lab values.    Time coordinating discharge: 40  minutes  SIGNED:   Shelly Coss, MD  [facility] 09/04/2017, 3:24 PM Pager [phone]  If 7PM-7AM, please contact night-coverage [email or web address] Password [email or web address]

## 2017-09-04 NOTE — Brief Op Note (Signed)
09/01/2017 - 09/04/2017  8:23 AM  PATIENT:  Patricia [name]  42 y.o. female  PRE-OPERATIVE DIAGNOSIS:  GI bleed  POST-OPERATIVE DIAGNOSIS:  Transverse colon polyps removed via cold biopsy forceps, mucosal friability   PROCEDURE:  Procedure(s): COLONOSCOPY WITH PROPOFOL (N/A) POLYPECTOMY  SURGEON:  Surgeon(s) and Role:    Ronnette Juniper, MD - Primary  PHYSICIAN ASSISTANT:   ASSISTANTS: Baird Cancer, RN, Nevin Bloodgood, Tech ANESTHESIA:   MAC  EBL:  0 mL   BLOOD ADMINISTERED:none  DRAINS: none   LOCAL MEDICATIONS USED:  NONE  SPECIMEN:  Biopsy / Limited Resection  DISPOSITION OF SPECIMEN:  PATHOLOGY  COUNTS:  YES  TOURNIQUET:  * No tourniquets in log *  DICTATION: .Dragon Dictation  PLAN OF CARE: Admit to inpatient   PATIENT DISPOSITION:  PACU - hemodynamically stable.   Delay start of Pharmacological VTE agent (>24hrs) due to surgical blood loss or risk of bleeding: no

## 2017-09-04 NOTE — Op Note (Signed)
Colonoscopy was performed for dark stools, unremarkable EGD and anemia, history of colon cancer in her maternal grandmother in her 6s, no prior colonoscopy.  Findings: Mucosal friability and minimal amount of fresh blood was noted in cecum and ascending colon. No active bleeding on thorough lavage. 2 polyps noted in transverse colon, from 3 mm to 8 mm in size, removed via biopsy polypectomy. Few small diverticulosis noted in sigmoid and descending colon. Patchy erythema noted in sigmoid, descending, transverse and ascending colon. Normal retroflexion.   Recommendations: Resume renal diet. Okay to discharge from GI standpoint. Repeat colonoscopy in 3-5 years for surveillance depending upon pathology.   Ronnette Juniper, M.D. [phone]

## 2017-09-04 NOTE — Anesthesia Preprocedure Evaluation (Addendum)
Anesthesia Evaluation  Patient identified by MRN, date of birth, ID band Patient awake    Reviewed: Allergy & Precautions, NPO status , Patient's Chart, lab work & pertinent test results, reviewed documented beta blocker date and time   Airway Mallampati: II  TM Distance: >3 FB Neck ROM: Full    Dental  (+) Teeth Intact, Dental Advisory Given   Pulmonary shortness of breath,    Pulmonary exam normal breath sounds clear to auscultation       Cardiovascular hypertension, Pt. on home beta blockers and Pt. on medications Normal cardiovascular exam Rhythm:Regular Rate:Normal     Neuro/Psych  Headaches, PSYCHIATRIC DISORDERS Anxiety CVA    GI/Hepatic Neg liver ROS, GERD  Medicated,  Endo/Other  negative endocrine ROSObesity   Renal/GU ESRF and DialysisRenal disease (TTHSat)     Musculoskeletal  (+) Arthritis ,   Abdominal   Peds  Hematology  (+) Blood dyscrasia, anemia ,   Anesthesia Other Findings Day of surgery medications reviewed with the patient.  Reproductive/Obstetrics                             Anesthesia Physical Anesthesia Plan  ASA: III  Anesthesia Plan: MAC   Post-op Pain Management:    Induction: Intravenous  PONV Risk Score and Plan: 2 and Treatment may vary due to age or medical condition and Propofol infusion  Airway Management Planned: Nasal Cannula and Natural Airway  Additional Equipment:   Intra-op Plan:   Post-operative Plan:   Informed Consent: I have reviewed the patients History and Physical, chart, labs and discussed the procedure including the risks, benefits and alternatives for the proposed anesthesia with the patient or authorized representative who has indicated his/her understanding and acceptance.   Dental advisory given  Plan Discussed with: CRNA and Anesthesiologist  Anesthesia Plan Comments:        Anesthesia Quick Evaluation

## 2017-09-04 NOTE — Interval H&P Note (Signed)
History and Physical Interval Note: 42/female with anemia, dark stools, unrevealing EGD for a diagnostic colonoscopy. Her grandmother had colon cancer in her early 77s. No prior colonoscopy.  09/04/2017 7:29 AM  Patricia [name]  has presented today for colonoscopy, with the diagnosis of GI bleed  The various methods of treatment have been discussed with the patient and family. After consideration of risks, benefits and other options for treatment, the patient has consented to  Procedure(s): COLONOSCOPY WITH PROPOFOL (N/A) as a surgical intervention .  The patient's history has been reviewed, patient examined, no change in status, stable for surgery.  I have reviewed the patient's chart and labs.  Questions were answered to the patient's satisfaction.     Ronnette Juniper

## 2017-09-04 NOTE — Op Note (Signed)
[facility] [name] Procedure Date : 09/04/2017 MRN: [id number] Attending MD: Ronnette Juniper , MD Date of Birth: 08-16-1975 CSN: [id number] Age: 42 Admit Type: Inpatient Procedure:                Colonoscopy Indications:              This is the patient's first colonoscopy, Melena,                            unremarkable EGD Providers:                Ronnette Juniper, MD, Baird Cancer, RN, Nevin Bloodgood,                            Technician, Clearnce Sorrel, CRNA Referring MD:              Medicines:                Monitored Anesthesia Care Complications:            No immediate complications. Estimated Blood Loss:     Estimated blood loss was minimal. Procedure:                Pre-Anesthesia Assessment:                           - Prior to the procedure, a History and Physical                            was performed, and patient medications and                            allergies were reviewed. The patient's tolerance of                            previous anesthesia was also reviewed. The risks                            and benefits of the procedure and the sedation                            options and risks were discussed with the patient.                            All questions were answered, and informed consent                            was obtained. Prior Anticoagulants: The patient has                            taken no previous anticoagulant or antiplatelet                            agents. ASA Grade Assessment: III - A patient with  severe systemic disease. After reviewing the risks                            and benefits, the patient was deemed in                            satisfactory condition to undergo the procedure.                           After obtaining informed consent, the colonoscope                            was passed under direct vision. Throughout the                            procedure, the  patient's blood pressure, pulse, and                            oxygen saturations were monitored continuously. The                            [id number]-[id number] ([id number]) peds colon was introduced                            through the anus and advanced to the the cecum,                            identified by appendiceal orifice and ileocecal                            valve. The colonoscopy was performed without                            difficulty. The patient tolerated the procedure                            well. The quality of the bowel preparation was                            adequate to identify polyps 6 mm and larger in size. Scope In: 7:43:28 AM Scope Out: 1:51:76 AM Scope Withdrawal Time: 0 hours 12 minutes 15 seconds  Total Procedure Duration: 0 hours 33 minutes 18 seconds  Findings:      The perianal and digital rectal examinations were normal.      A patchy area of moderately congested and erythematous mucosa was found       in the sigmoid colon, in the transverse colon and in the ascending colon.      Mucosal friability noted in ascending colon, minimal fresh blood noted       in cecum and ascending colon, no active bleeding on thorough lavage.      A few small-mouthed diverticula were found in the sigmoid colon and       descending colon.      The retroflexed view of the distal rectum and anal verge was normal and  showed no anal or rectal abnormalities.      Two sessile polyps were found in the transverse colon. The polyps were 3       to 8 mm in size. These polyps were removed with a piecemeal technique       using a cold biopsy forceps. Resection and retrieval were complete. Impression:               - Congested and erythematous mucosa in the sigmoid                            colon, in the transverse colon and in the ascending                            colon. Mucosal friability noted in ascending colon                            and cecum, no active bleeding.                            - Diverticulosis in the sigmoid colon and in the                            descending colon.                           - The distal rectum and anal verge are normal on                            retroflexion view.                           - Two 3 to 8 mm polyps in the transverse colon,                            removed piecemeal using a cold biopsy forceps.                            Resected and retrieved. Moderate Sedation:      Patient did not receive moderate sedation for this procedure, but       instead received monitored anesthesia care. Recommendation:           - Low sodium diet.                           - Continue present medications.                           - Await pathology results.                           - Repeat colonoscopy in 3 - 5 years for                            surveillance. Procedure Code(s):        --- Professional ---  45380, Colonoscopy, flexible; with biopsy, single                            or multiple Diagnosis Code(s):        --- Professional ---                           K63.89, Other specified diseases of intestine                           D12.3, Benign neoplasm of transverse colon (hepatic                            flexure or splenic flexure)                           K92.1, Melena (includes Hematochezia)                           K57.30, Diverticulosis of large intestine without                            perforation or abscess without bleeding CPT copyright 2017 American Medical Association. All rights reserved. The codes documented in this report are preliminary and upon coder review may  be revised to meet current compliance requirements. Ronnette Juniper, MD 09/04/2017 8:23:31 AM This report has been signed electronically. Number of Addenda: 0

## 2017-09-04 NOTE — Transfer of Care (Signed)
Immediate Anesthesia Transfer of Care Note  Patient: Patricia [name]  Procedure(s) Performed: COLONOSCOPY WITH PROPOFOL (N/A ) POLYPECTOMY  Patient Location: Endoscopy Unit  Anesthesia Type:MAC  Level of Consciousness: awake, alert  and oriented  Airway & Oxygen Therapy: Patient Spontanous Breathing and Patient connected to nasal cannula oxygen  Post-op Assessment: Report given to RN and Post -op Vital signs reviewed and stable  Post vital signs: Reviewed and stable  Last Vitals:  Vitals Value Taken Time  BP    Temp    Pulse    Resp    SpO2      Last Pain:  Vitals:   09/04/17 0647  TempSrc: Oral  PainSc: 0-No pain         Complications: No apparent anesthesia complications

## 2017-09-04 NOTE — Progress Notes (Addendum)
Subjective:  No cos eating  breakfast . Thinks may go home /   Objective Vital signs in last 24 hours: Vitals:   09/04/17 0647 09/04/17 0823 09/04/17 0833 09/04/17 0856  BP: (!) 151/85 (!) 144/75 (!) 163/85 (!) 176/99  Pulse: 76 75 70 70  Resp: 19 (!) $Remo'22 18 19  'bVlzk$ Temp: 98.3 F (36.8 C) 97.8 F (36.6 C)  97.8 F (36.6 C)  TempSrc: Oral Oral  Oral  SpO2: 100% 100% 100% 100%  Weight:      Height:       Weight change:   Physical Exam General:  Alert NAD  Heart: RRR; no m. r or g  Lungs: CTA bilat.  Abdomen: soft,  MT. ND  With PD cath site benign  Extremities: No LE edema Dialysis Access: PD cath in abd. Clean /dry dressing / LUA AVG healed surgical scar from prior ligation  Removal AVGG ( 08/11/17 )  Dialysis Orders: CCPD 7X Week, EDW 108.5 (kg) Ca 2.5 (mEq/L) Mg 0.5 (mEq/L) Dextrose 1.5; 2.5; 4.25 %,5# Exchanges33fillsvol 3000 mL, Dwell Time 2 hourslast fill vol 3000 mL, Day Exchanges 1, daytime fill vol 3000 mL, Day Dwell Time 4 hrsDialysis   Problem/Plan: 1. Melena/symptomatic anemia:  This am   colonoscopy   2 polyps and diverticulosis , cecal and ascending colon noted mucosal friability and min amount fresh blood no active bleeding / am hgb 8.0 . S/p 1U PRBCs and Aranesp 237mcg on 8/29. 2. ESRD: Continue CCPD nightly - PD tonight in hosp. or at home if dc .  3. HTN/volume: BP slightly up , no edema. . All 2.5% PD fluid overnight, was NPO for EGD  yest . -  Op meds Lisinopril $RemoveBeforeD'20mg'yCbxCeUUfLOWkj$  hs  And lopressor 25 mg  q day  continue  4. Secondary hyperparathyroidism:  po calcitriol  q day , on sensipar  velphoro as binder /  Corrected ca ok  Phos 7.4  5. Recent esophageal candidiasis: Finishing course of IV Fluconazole./ po as  Sharman Cheek, PA-C [facility] [phone] 09/04/2017,9:53 AM  LOS: 1 day   Labs: Basic Metabolic Panel: Recent Labs  Lab 09/01/17 2009 09/02/17 0524 09/04/17 0640  NA 138 136 135  K 4.1 4.0 3.8  CL 95* 96* 95*  CO2 $Re'24  24 23  'DNq$ GLUCOSE 99 85 98  BUN 49* 57* 56*  CREATININE 15.18* 16.34* 15.74*  CALCIUM 7.9* 7.3* 7.5*   Liver Function Tests: No results for input(s): AST, ALT, ALKPHOS, BILITOT, PROT, ALBUMIN in the last 168 hours. No results for input(s): LIPASE, AMYLASE in the last 168 hours. No results for input(s): AMMONIA in the last 168 hours. CBC: Recent Labs  Lab 09/01/17 2009 09/02/17 0524 09/02/17 1018 09/03/17 0340 09/04/17 0640  WBC 8.3 6.7  --  6.2 6.3  HGB 7.9* 6.8* 8.5* 8.1* 8.0*  HCT 25.7* 21.6* 26.1* 25.3* 24.9*  MCV 86.0 84.4  --  84.1 85.3  PLT 315 236  --  220 232   Cardiac Enzymes: No results for input(s): CKTOTAL, CKMB, CKMBINDEX, TROPONINI in the last 168 hours. CBG: No results for input(s): GLUCAP in the last 168 hours.  Studies/Results: No results found. Medications: . sodium chloride     . ciprofloxacin  500 mg Oral Once  . gentamicin cream  1 application Topical Daily  . metroNIDAZOLE  500 mg Oral Once  . mupirocin ointment  1 application Topical TID  . pantoprazole  40 mg Intravenous Q12H  .  sodium chloride flush  3 mL Intravenous Q12H

## 2017-09-05 ENCOUNTER — Encounter (HOSPITAL_COMMUNITY): Payer: Self-pay | Admitting: Gastroenterology

## 2017-09-16 ENCOUNTER — Ambulatory Visit (INDEPENDENT_AMBULATORY_CARE_PROVIDER_SITE_OTHER): Payer: Medicare Other | Admitting: Vascular Surgery

## 2017-09-16 ENCOUNTER — Other Ambulatory Visit: Payer: Self-pay

## 2017-09-16 ENCOUNTER — Encounter: Payer: Self-pay | Admitting: Vascular Surgery

## 2017-09-16 VITALS — BP 129/87 | HR 69 | Temp 98.0°F | Resp 18 | Ht 67.0 in | Wt 241.0 lb

## 2017-09-16 DIAGNOSIS — Z992 Dependence on renal dialysis: Secondary | ICD-10-CM

## 2017-09-16 DIAGNOSIS — N186 End stage renal disease: Secondary | ICD-10-CM

## 2017-09-16 NOTE — Progress Notes (Signed)
Patient is a 42 year old female who returns for postoperative follow-up today.  She has had difficulty with left upper externally access.  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  Recently the fistula was converted to a graft due to difficulty in accessing the fistula and she developed a steal in the left hand.  She had a graft subsequently removed 2 days later.  She currently is using peritoneal dialysis.  Her nephrologist Dr. Stephania Fragmin and would like [place] to place a new hemodialysis access.  She is right-handed.  She states the numbness tingling and aching in her left upper extremity has now resolved.  Past Medical History:  Diagnosis Date  . Acid reflux   . Anxiety   . Arthritis   . Dyspnea   . Dysrhythmia    tachycardia  . ESRD (end stage renal disease) ([place])    TTHSAT- [place]  . Gout   . Headache(784.0)    "related to chemo; sometimes weekly" (09/12/2013)  . High cholesterol   . History of blood transfusion "a couple"   "related to low counts"  . Hypertension   . Iron deficiency anemia    "get epogen shots q month" (02/20/2014)  . MCGN (minimal change glomerulonephritis)    "using chemo to tx;  finished my last tx in 12/2013"    Past Surgical History:  Procedure Laterality Date  . ABDOMINAL HYSTERECTOMY  2010   "laparoscopic"  . ANKLE FRACTURE SURGERY Right 1994  . AV FISTULA PLACEMENT Left 04/14/2016   Procedure: ARTERIOVENOUS (AV) FISTULA CREATION;  Surgeon: Angelia Mould, MD;  Location: [facility] OR;  Service: Vascular;  Laterality: Left;  . AV FISTULA PLACEMENT Left 08/09/2017   Procedure: INSERTION OF ARTERIOVENOUS (AV) GORE-TEX GRAFT LEFT ARM;  Surgeon: Elam Dutch, MD;  Location: [place];  Service: Vascular;  Laterality: Left;  . [place] REMOVAL Left 08/11/2017   Procedure: REMOVAL OF ARTERIOVENOUS GORETEX GRAFT ([place]);  Surgeon: Serafina Mitchell, MD;  Location: [facility] OR;  Service: Vascular;  Laterality: Left;  . BIOPSY  09/03/2017   Procedure: BIOPSY;  Surgeon:  Otis Brace, MD;  Location: [place];  Service: Gastroenterology;;  . CARDIAC CATHETERIZATION  2000's  . COLONOSCOPY WITH PROPOFOL N/A 09/04/2017   Procedure: COLONOSCOPY WITH PROPOFOL;  Surgeon: Ronnette Juniper, MD;  Location: [place];  Service: Gastroenterology;  Laterality: N/A;  . ESOPHAGOGASTRODUODENOSCOPY    . ESOPHAGOGASTRODUODENOSCOPY (EGD) WITH PROPOFOL Left 06/04/2017   Procedure: ESOPHAGOGASTRODUODENOSCOPY (EGD) WITH PROPOFOL;  Surgeon: Arta Silence, MD;  Location: [place];  Service: Endoscopy;  Laterality: Left;  . ESOPHAGOGASTRODUODENOSCOPY (EGD) WITH PROPOFOL N/A 09/03/2017   Procedure: ESOPHAGOGASTRODUODENOSCOPY (EGD) WITH PROPOFOL;  Surgeon: Otis Brace, MD;  Location: [place];  Service: Gastroenterology;  Laterality: N/A;  . FISTULA SUPERFICIALIZATION Left 04/02/2017   Procedure: FISTULA SUPERFICIALIZATION LEFT ARM;  Surgeon: Serafina Mitchell, MD;  Location: [place];  Service: Vascular;  Laterality: Left;  . FISTULOGRAM Left 04/07/2016   Procedure: FISTULOGRAM;  Surgeon: Waynetta Sandy, MD;  Location: [place];  Service: Vascular;  Laterality: Left;  . FRACTURE SURGERY     right ankle  . INSERTION OF DIALYSIS CATHETER Right 04/07/2016   Procedure: INSERTION OF DIALYSIS CATHETER;  Surgeon: Waynetta Sandy, MD;  Location: [place];  Service: Vascular;  Laterality: Right;  . INSERTION OF DIALYSIS CATHETER Right 04/04/2017   Procedure: INSERTION OF DIALYSIS CATHETER;  Surgeon: Waynetta Sandy, MD;  Location: [place];  Service: Vascular;  Laterality: Right;  . LIGATION OF ARTERIOVENOUS  FISTULA Left 04/14/2016   Procedure: LIGATION OF ARTERIOVENOUS  FISTULA;  Surgeon: Angelia Mould, MD;  Location: [place];  Service: Vascular;  Laterality: Left;  . PATCH ANGIOPLASTY Left 06/19/2016   Procedure: PATCH ANGIOPLASTY LEFT ARTERIOVENOUS FISTULA;  Surgeon: Angelia Mould, MD;  Location: [place];  Service: Vascular;  Laterality: Left;  .  POLYPECTOMY  09/04/2017   Procedure: POLYPECTOMY;  Surgeon: Ronnette Juniper, MD;  Location: [facility] ENDOSCOPY;  Service: Gastroenterology;;  . REVISON OF ARTERIOVENOUS FISTULA Left 06/19/2016   Procedure: REVISION SUPERFICIALIZATION OF BRACHIOCEPHALIC ARTERIOVENOUS FISTULA;  Surgeon: Angelia Mould, MD;  Location: [place];  Service: Vascular;  Laterality: Left;     Current Outpatient Medications on File Prior to Visit  Medication Sig Dispense Refill  . allopurinol (ZYLOPRIM) 100 MG tablet Take 100 mg by mouth at bedtime.     . ALPRAZolam (XANAX) 1 MG tablet Take 1 mg by mouth 2 (two) times daily as needed for anxiety.     Marland Kitchen aspirin 81 MG chewable tablet Chew 1 tablet (81 mg total) by mouth daily. (Patient taking differently: Chew 81 mg by mouth at bedtime. )    . atorvastatin (LIPITOR) 40 MG tablet Take 40 mg by mouth at bedtime.   3  . calcitRIOL (ROCALTROL) 0.5 MCG capsule Take 1 capsule (0.5 mcg total) by mouth daily with breakfast. (Patient taking differently: Take 0.5 mcg by mouth at bedtime. ) 30 capsule 0  . cinacalcet (SENSIPAR) 30 MG tablet Take 1 tablet (30 mg total) by mouth daily. 30 tablet 0  . diphenhydramine-acetaminophen (TYLENOL PM) 25-500 MG TABS tablet Take 2 tablets by mouth at bedtime.    . fluticasone (FLONASE) 50 MCG/ACT nasal spray Place 1 spray into both nostrils daily as needed for allergies or rhinitis.    Marland Kitchen gentamicin cream (GARAMYCIN) 0.1 % Apply 1 application topically daily.    Marland Kitchen lisinopril (PRINIVIL,ZESTRIL) 20 MG tablet Take 20 mg by mouth at bedtime.     . metoprolol tartrate (LOPRESSOR) 25 MG tablet Take 25 mg by mouth daily as needed (for high BP > 140).     . multivitamin (RENA-VIT) TABS tablet Take 1 tablet by mouth at bedtime.     . mupirocin ointment (BACTROBAN) 2 % Apply 1 application topically 3 (three) times daily.  0  . ondansetron (ZOFRAN) 8 MG tablet Take 8 mg by mouth every 8 (eight) hours as needed for nausea or vomiting.  0  . pantoprazole (PROTONIX)  40 MG tablet Take 1 tablet (40 mg total) by mouth daily. 30 tablet 0  . promethazine (PHENERGAN) 12.5 MG tablet Take 12.5 mg by mouth every 6 (six) hours as needed for nausea or vomiting.  0  . senna (SENOKOT) 8.6 MG TABS tablet Take 2 tablets (17.2 mg total) by mouth at bedtime. 120 each 0  . sucroferric oxyhydroxide (VELPHORO) 500 MG chewable tablet Chew 500 mg by mouth 3 (three) times daily with meals.    . venlafaxine XR (EFFEXOR-XR) 75 MG 24 hr capsule Take 75 mg by mouth at bedtime.     Marland Kitchen oxyCODONE-acetaminophen (PERCOCET/ROXICET) 5-325 MG tablet Take 1-2 tablets by mouth every 6 (six) hours as needed for severe pain. (Patient not taking: Reported on 09/16/2017) 15 tablet 0   No current facility-administered medications on file prior to visit.    Physical exam:  Vitals:   09/16/17 1448  BP: 129/87  Pulse: 69  Resp: 18  Temp: 98 F (36.7 C)  TempSrc: Oral  SpO2: 100%  Weight: 241 lb (109.3 kg)  Height: $Remove'5\' 7"'uzePJZz$  (1.702 m)    Left upper extremity: Well-healed antecubital axillary incision no palpable radial pulse hand pink warm well perfused  Right upper extremity: Vaguely palpable radial pulse one plus right brachial pulse obese upper extremity  Chest: Scar right anterior chest wall from prior dialysis catheter no obvious chest wall collaterals  Assessment: Slowly improving from recent ischemic steal left upper extremity.  The patient needs a new long-term hemodialysis access.  Plan: The patient will follow-up in 2 weeks with a vein mapping ultrasound and upper extremity arterial duplex on the right side to consider a right arm access.  She prefers to wait a few weeks prior to placing a new access to let her left arm continued to recover.  I did discuss with her today that she is certainly at risk of developing a steal in the right arm but hopefully she will not.  He did inquire today about a thigh graft and I told her that she would be at high risk of infection with this and I would  prefer to stay in her upper extremities until we exhaust all of those.  Ruta Hinds, MD [facility] Office: [phone] Pager: [phone]

## 2017-09-17 ENCOUNTER — Other Ambulatory Visit: Payer: Self-pay

## 2017-09-17 DIAGNOSIS — N186 End stage renal disease: Secondary | ICD-10-CM

## 2017-09-17 DIAGNOSIS — Z992 Dependence on renal dialysis: Secondary | ICD-10-CM

## 2017-09-23 ENCOUNTER — Encounter: Payer: Medicare Other | Admitting: Vascular Surgery

## 2017-10-06 ENCOUNTER — Encounter (HOSPITAL_COMMUNITY): Payer: Medicare Other

## 2017-10-06 ENCOUNTER — Other Ambulatory Visit (HOSPITAL_COMMUNITY): Payer: Medicare Other

## 2017-10-07 ENCOUNTER — Ambulatory Visit: Payer: Medicare Other | Admitting: Vascular Surgery

## 2017-10-27 ENCOUNTER — Inpatient Hospital Stay (HOSPITAL_COMMUNITY)
Admission: EM | Admit: 2017-10-27 | Discharge: 2017-10-31 | DRG: 811 | Disposition: A | Discharge status: Home / Self Care | Payer: Medicare Other | Attending: Internal Medicine | Admitting: Internal Medicine

## 2017-10-27 ENCOUNTER — Encounter (HOSPITAL_COMMUNITY): Payer: Self-pay | Admitting: Emergency Medicine

## 2017-10-27 ENCOUNTER — Emergency Department (HOSPITAL_COMMUNITY): Payer: Medicare Other

## 2017-10-27 DIAGNOSIS — Z79899 Other long term (current) drug therapy: Secondary | ICD-10-CM

## 2017-10-27 DIAGNOSIS — D631 Anemia in chronic kidney disease: Secondary | ICD-10-CM | POA: Diagnosis present

## 2017-10-27 DIAGNOSIS — K219 Gastro-esophageal reflux disease without esophagitis: Secondary | ICD-10-CM | POA: Diagnosis present

## 2017-10-27 DIAGNOSIS — Z992 Dependence on renal dialysis: Secondary | ICD-10-CM | POA: Diagnosis present

## 2017-10-27 DIAGNOSIS — D62 Acute posthemorrhagic anemia: Secondary | ICD-10-CM | POA: Diagnosis not present

## 2017-10-27 DIAGNOSIS — K2980 Duodenitis without bleeding: Secondary | ICD-10-CM | POA: Diagnosis present

## 2017-10-27 DIAGNOSIS — E7800 Pure hypercholesterolemia, unspecified: Secondary | ICD-10-CM | POA: Diagnosis present

## 2017-10-27 DIAGNOSIS — Z7982 Long term (current) use of aspirin: Secondary | ICD-10-CM

## 2017-10-27 DIAGNOSIS — Z888 Allergy status to other drugs, medicaments and biological substances status: Secondary | ICD-10-CM

## 2017-10-27 DIAGNOSIS — N2581 Secondary hyperparathyroidism of renal origin: Secondary | ICD-10-CM | POA: Diagnosis present

## 2017-10-27 DIAGNOSIS — Z886 Allergy status to analgesic agent status: Secondary | ICD-10-CM

## 2017-10-27 DIAGNOSIS — I10 Essential (primary) hypertension: Secondary | ICD-10-CM | POA: Diagnosis present

## 2017-10-27 DIAGNOSIS — N186 End stage renal disease: Secondary | ICD-10-CM | POA: Diagnosis present

## 2017-10-27 DIAGNOSIS — D649 Anemia, unspecified: Secondary | ICD-10-CM

## 2017-10-27 DIAGNOSIS — F419 Anxiety disorder, unspecified: Secondary | ICD-10-CM | POA: Diagnosis present

## 2017-10-27 DIAGNOSIS — I120 Hypertensive chronic kidney disease with stage 5 chronic kidney disease or end stage renal disease: Secondary | ICD-10-CM | POA: Diagnosis present

## 2017-10-27 DIAGNOSIS — K2970 Gastritis, unspecified, without bleeding: Secondary | ICD-10-CM | POA: Diagnosis present

## 2017-10-27 HISTORY — DX: Dependence on renal dialysis: Z99.2

## 2017-10-27 LAB — I-STAT TROPONIN, ED: Troponin i, poc: 0.01 ng/mL (ref 0.00–0.08)

## 2017-10-27 LAB — CBC
HEMATOCRIT: 23.2 % — AB (ref 36.0–46.0)
HEMOGLOBIN: 7.3 g/dL — AB (ref 12.0–15.0)
MCH: 30.4 pg (ref 26.0–34.0)
MCHC: 31.5 g/dL (ref 30.0–36.0)
MCV: 96.7 fL (ref 80.0–100.0)
NRBC: 1.1 % — AB (ref 0.0–0.2)
Platelets: 332 10*3/uL (ref 150–400)
RBC: 2.4 MIL/uL — AB (ref 3.87–5.11)
RDW: 18.1 % — AB (ref 11.5–15.5)
WBC: 9.3 10*3/uL (ref 4.0–10.5)

## 2017-10-27 NOTE — ED Triage Notes (Signed)
Patient reports central chest pain  with mild  SOB , emesis and lightheadedness onset this week , peritoneal dialysis treatment yesterday , denies fever or chills.

## 2017-10-28 ENCOUNTER — Encounter (HOSPITAL_COMMUNITY): Payer: Self-pay

## 2017-10-28 ENCOUNTER — Other Ambulatory Visit: Payer: Self-pay

## 2017-10-28 DIAGNOSIS — N186 End stage renal disease: Secondary | ICD-10-CM

## 2017-10-28 DIAGNOSIS — K922 Gastrointestinal hemorrhage, unspecified: Secondary | ICD-10-CM

## 2017-10-28 DIAGNOSIS — I151 Hypertension secondary to other renal disorders: Secondary | ICD-10-CM

## 2017-10-28 DIAGNOSIS — D649 Anemia, unspecified: Secondary | ICD-10-CM | POA: Diagnosis not present

## 2017-10-28 DIAGNOSIS — D62 Acute posthemorrhagic anemia: Principal | ICD-10-CM

## 2017-10-28 DIAGNOSIS — F419 Anxiety disorder, unspecified: Secondary | ICD-10-CM

## 2017-10-28 DIAGNOSIS — N2889 Other specified disorders of kidney and ureter: Secondary | ICD-10-CM

## 2017-10-28 LAB — BASIC METABOLIC PANEL
Anion gap: 15 (ref 5–15)
BUN: 49 mg/dL — ABNORMAL HIGH (ref 6–20)
CALCIUM: 8.1 mg/dL — AB (ref 8.9–10.3)
CO2: 23 mmol/L (ref 22–32)
CREATININE: 13.75 mg/dL — AB (ref 0.44–1.00)
Chloride: 97 mmol/L — ABNORMAL LOW (ref 98–111)
GFR calc non Af Amer: 3 mL/min — ABNORMAL LOW (ref >60)
GFR, EST AFRICAN AMERICAN: 3 mL/min — AB (ref >60)
Glucose, Bld: 104 mg/dL — ABNORMAL HIGH (ref 70–99)
Potassium: 3.7 mmol/L (ref 3.5–5.1)
Sodium: 135 mmol/L (ref 135–145)

## 2017-10-28 LAB — HEMOGLOBIN AND HEMATOCRIT, BLOOD
HCT: 27.9 % — ABNORMAL LOW (ref 36.0–46.0)
HEMATOCRIT: 22.5 % — AB (ref 36.0–46.0)
Hemoglobin: 6.9 g/dL — CL (ref 12.0–15.0)
Hemoglobin: 8.7 g/dL — ABNORMAL LOW (ref 12.0–15.0)

## 2017-10-28 LAB — PREPARE RBC (CROSSMATCH)

## 2017-10-28 LAB — POC OCCULT BLOOD, ED: FECAL OCCULT BLD: POSITIVE — AB

## 2017-10-28 MED ORDER — RENA-VITE PO TABS
1.0000 | ORAL_TABLET | Freq: Every day | ORAL | Status: DC
  Administered 2017-10-28 – 2017-10-30 (×3): 1 via ORAL
  Filled 2017-10-28 (×3): qty 1

## 2017-10-28 MED ORDER — FUROSEMIDE 10 MG/ML IJ SOLN
20.0000 mg | Freq: Once | INTRAMUSCULAR | Status: AC
  Administered 2017-10-28: 20 mg via INTRAVENOUS
  Filled 2017-10-28: qty 2

## 2017-10-28 MED ORDER — HEPARIN 1000 UNIT/ML FOR PERITONEAL DIALYSIS
INTRAPERITONEAL | Status: DC | PRN
  Filled 2017-10-28: qty 5000

## 2017-10-28 MED ORDER — ALPRAZOLAM 0.5 MG PO TABS
1.0000 mg | ORAL_TABLET | Freq: Two times a day (BID) | ORAL | Status: DC | PRN
  Administered 2017-10-28 – 2017-10-30 (×4): 1 mg via ORAL
  Filled 2017-10-28 (×4): qty 2

## 2017-10-28 MED ORDER — SODIUM CHLORIDE 0.9 % IV SOLN: 250.0000 mL | INTRAVENOUS | Status: DC | PRN

## 2017-10-28 MED ORDER — VENLAFAXINE HCL ER 75 MG PO CP24
75.0000 mg | ORAL_CAPSULE | Freq: Every day | ORAL | Status: DC
  Administered 2017-10-28 – 2017-10-30 (×3): 75 mg via ORAL
  Filled 2017-10-28 (×3): qty 1

## 2017-10-28 MED ORDER — HEPARIN 1000 UNIT/ML FOR PERITONEAL DIALYSIS: 500.0000 [IU] | INTRAMUSCULAR | Status: DC | PRN

## 2017-10-28 MED ORDER — DELFLEX-LC/2.5% DEXTROSE 394 MOSM/L IP SOLN
INTRAPERITONEAL | Status: DC
  Administered 2017-10-28: 19:00:00 via INTRAPERITONEAL
  Administered 2017-10-29: 5000 mL via INTRAPERITONEAL
  Administered 2017-10-30: 15000 mL via INTRAPERITONEAL

## 2017-10-28 MED ORDER — DELFLEX-LC/2.5% DEXTROSE 394 MOSM/L IP SOLN: INTRAPERITONEAL | Status: DC

## 2017-10-28 MED ORDER — SODIUM CHLORIDE 0.9 % IV SOLN
10.0000 mL/h | Freq: Once | INTRAVENOUS | Status: AC
  Administered 2017-10-28: 10 mL/h via INTRAVENOUS

## 2017-10-28 MED ORDER — GENTAMICIN SULFATE 0.1 % EX CREA
1.0000 "application " | TOPICAL_CREAM | Freq: Every day | CUTANEOUS | Status: DC
  Administered 2017-10-28 – 2017-10-30 (×5): 1 via TOPICAL
  Filled 2017-10-28: qty 15

## 2017-10-28 MED ORDER — SODIUM CHLORIDE 0.9% IV SOLUTION
Freq: Once | INTRAVENOUS | Status: AC
  Administered 2017-10-28: 12:00:00 via INTRAVENOUS

## 2017-10-28 MED ORDER — ATORVASTATIN CALCIUM 40 MG PO TABS
40.0000 mg | ORAL_TABLET | Freq: Every day | ORAL | Status: DC
  Administered 2017-10-28 – 2017-10-30 (×3): 40 mg via ORAL
  Filled 2017-10-28 (×3): qty 1

## 2017-10-28 MED ORDER — ACETAMINOPHEN 650 MG RE SUPP: 650.0000 mg | Freq: Four times a day (QID) | RECTAL | Status: DC | PRN

## 2017-10-28 MED ORDER — CALCITRIOL 0.25 MCG PO CAPS
0.5000 ug | ORAL_CAPSULE | Freq: Every day | ORAL | Status: DC
  Administered 2017-10-28 – 2017-10-30 (×3): 0.5 ug via ORAL
  Filled 2017-10-28 (×3): qty 2

## 2017-10-28 MED ORDER — PANTOPRAZOLE SODIUM 40 MG PO TBEC
40.0000 mg | DELAYED_RELEASE_TABLET | Freq: Every day | ORAL | Status: DC
  Administered 2017-10-28 – 2017-10-31 (×4): 40 mg via ORAL
  Filled 2017-10-28 (×4): qty 1

## 2017-10-28 MED ORDER — SODIUM CHLORIDE 0.9% FLUSH
3.0000 mL | Freq: Two times a day (BID) | INTRAVENOUS | Status: DC
  Administered 2017-10-28 – 2017-10-30 (×5): 3 mL via INTRAVENOUS

## 2017-10-28 MED ORDER — DIPHENHYDRAMINE HCL 50 MG/ML IJ SOLN
25.0000 mg | Freq: Once | INTRAMUSCULAR | Status: AC
  Administered 2017-10-28: 25 mg via INTRAVENOUS
  Filled 2017-10-28: qty 1

## 2017-10-28 MED ORDER — CINACALCET HCL 30 MG PO TABS
30.0000 mg | ORAL_TABLET | Freq: Every day | ORAL | Status: DC
  Administered 2017-10-28 – 2017-10-31 (×3): 30 mg via ORAL
  Filled 2017-10-28 (×3): qty 1

## 2017-10-28 MED ORDER — LISINOPRIL 10 MG PO TABS
10.0000 mg | ORAL_TABLET | Freq: Every day | ORAL | Status: DC
  Administered 2017-10-28 – 2017-10-30 (×3): 10 mg via ORAL
  Filled 2017-10-28 (×3): qty 1

## 2017-10-28 MED ORDER — ONDANSETRON HCL 4 MG PO TABS
4.0000 mg | ORAL_TABLET | Freq: Four times a day (QID) | ORAL | Status: DC | PRN
  Administered 2017-10-30 (×2): 4 mg via ORAL
  Filled 2017-10-28 (×2): qty 1

## 2017-10-28 MED ORDER — ACETAMINOPHEN 325 MG PO TABS
650.0000 mg | ORAL_TABLET | Freq: Four times a day (QID) | ORAL | Status: DC | PRN
  Administered 2017-10-28 – 2017-10-29 (×2): 650 mg via ORAL
  Filled 2017-10-28 (×2): qty 2

## 2017-10-28 MED ORDER — ONDANSETRON HCL 4 MG/2ML IJ SOLN
4.0000 mg | Freq: Four times a day (QID) | INTRAMUSCULAR | Status: DC | PRN
  Administered 2017-10-29: 4 mg via INTRAVENOUS
  Filled 2017-10-28: qty 2

## 2017-10-28 MED ORDER — ACETAMINOPHEN 325 MG PO TABS
650.0000 mg | ORAL_TABLET | Freq: Once | ORAL | Status: AC
  Administered 2017-10-28: 650 mg via ORAL
  Filled 2017-10-28: qty 2

## 2017-10-28 MED ORDER — HYDROCODONE-ACETAMINOPHEN 5-325 MG PO TABS: 1.0000 | ORAL_TABLET | ORAL | Status: DC | PRN

## 2017-10-28 MED ORDER — SUCROFERRIC OXYHYDROXIDE 500 MG PO CHEW
1000.0000 mg | CHEWABLE_TABLET | Freq: Three times a day (TID) | ORAL | Status: DC
  Administered 2017-10-28 – 2017-10-31 (×8): 1000 mg via ORAL
  Filled 2017-10-28 (×11): qty 2

## 2017-10-28 MED ORDER — SODIUM CHLORIDE 0.9% FLUSH
3.0000 mL | Freq: Two times a day (BID) | INTRAVENOUS | Status: DC
  Administered 2017-10-28 – 2017-10-29 (×3): 3 mL via INTRAVENOUS

## 2017-10-28 MED ORDER — METOPROLOL TARTRATE 25 MG PO TABS
25.0000 mg | ORAL_TABLET | Freq: Every evening | ORAL | Status: DC
  Administered 2017-10-28 – 2017-10-29 (×2): 25 mg via ORAL
  Filled 2017-10-28 (×3): qty 1

## 2017-10-28 MED ORDER — SODIUM CHLORIDE 0.9% FLUSH: 3.0000 mL | INTRAVENOUS | Status: DC | PRN

## 2017-10-28 NOTE — H&P (Signed)
History and Physical    Patricia [name] RCV:[id number] DOB: [birth year] DOA: 10/27/2017  PCP: Blair Heys, PA-C   Patient coming from: Home   Chief Complaint: DOE, fatigue, lightheadedness   HPI: Patricia [name] is a 42 y.o. female with medical history significant for end-stage renal disease on peritoneal dialysis, anxiety disorder, hypertension, and chronic anemia with occult GI bleeding, now presenting to the emergency department with exertional dyspnea, lightheadedness, and fatigue.  Patient reports the insidious development of the symptoms over the past week, reports that her stool is always very dark which she attributes to her medications, but reports that the odor has been similar to her prior experiences with melena.  There has been some mild generalized abdominal discomfort, but no real pain.  She denies any vomiting indigestion.  Denies fevers, chills, or significant cough.  Reports some lightheadedness, particularly upon standing over the past couple days.  Also reports fatigue and exertional dyspnea similar to when she has required transfusions in the past.  ED Course: Upon arrival to the ED, patient is found to be afebrile, saturating well on room air, and with vitals otherwise normal.  EKG features normal sinus rhythm and chest x-ray is negative for acute cardiopulmonary disease.  Chemistry panel is notable for normal potassium, normal bicarbonate, and BUN of 49, similar to priors.  CBC features a normocytic anemia with hemoglobin of 7.3, down from 8 in August.  Fecal occult blood testing is positive.  1 unit packed red blood cells was ordered from the emergency department, patient remains hemodynamically stable, and will be observed for ongoing evaluation and management of symptomatic anemia.   Review of Systems:  All other systems reviewed and apart from HPI, are negative.  Past Medical History:  Diagnosis Date  . Acid reflux   . Anxiety   . Arthritis   . Dyspnea   .  Dysrhythmia    tachycardia  . ESRD (end stage renal disease) ([place])    TTHSAT- [place]  . Gout   . Headache(784.0)    "related to chemo; sometimes weekly" (09/12/2013)  . High cholesterol   . History of blood transfusion "a couple"   "related to low counts"  . Hypertension   . Iron deficiency anemia    "get epogen shots q month" (02/20/2014)  . 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 Peritoneal dialysis status [facility])     Past Surgical History:  Procedure Laterality Date  . ABDOMINAL HYSTERECTOMY  2010   "laparoscopic"  . ANKLE FRACTURE SURGERY Right 1994  . AV FISTULA PLACEMENT Left 04/14/2016   Procedure: ARTERIOVENOUS (AV) FISTULA CREATION;  Surgeon: Angelia Mould, MD;  Location: [facility] OR;  Service: Vascular;  Laterality: Left;  . AV FISTULA PLACEMENT Left 08/09/2017   Procedure: INSERTION OF ARTERIOVENOUS (AV) GORE-TEX GRAFT LEFT ARM;  Surgeon: Elam Dutch, MD;  Location: [place];  Service: Vascular;  Laterality: Left;  . [place] REMOVAL Left 08/11/2017   Procedure: REMOVAL OF ARTERIOVENOUS GORETEX GRAFT ([place]);  Surgeon: Serafina Mitchell, MD;  Location: [facility] OR;  Service: Vascular;  Laterality: Left;  . BIOPSY  09/03/2017   Procedure: BIOPSY;  Surgeon: Otis Brace, MD;  Location: [place];  Service: Gastroenterology;;  . CARDIAC CATHETERIZATION  2000's  . COLONOSCOPY WITH PROPOFOL N/A 09/04/2017   Procedure: COLONOSCOPY WITH PROPOFOL;  Surgeon: Ronnette Juniper, MD;  Location: [place];  Service: Gastroenterology;  Laterality: N/A;  . ESOPHAGOGASTRODUODENOSCOPY    .  ESOPHAGOGASTRODUODENOSCOPY (EGD) WITH PROPOFOL Left 06/04/2017   Procedure: ESOPHAGOGASTRODUODENOSCOPY (EGD) WITH PROPOFOL;  Surgeon: Arta Silence, MD;  Location: [place];  Service: Endoscopy;  Laterality: Left;  . ESOPHAGOGASTRODUODENOSCOPY (EGD) WITH PROPOFOL N/A 09/03/2017   Procedure: ESOPHAGOGASTRODUODENOSCOPY (EGD) WITH PROPOFOL;  Surgeon:  Otis Brace, MD;  Location: [place];  Service: Gastroenterology;  Laterality: N/A;  . FISTULA SUPERFICIALIZATION Left 04/02/2017   Procedure: FISTULA SUPERFICIALIZATION LEFT ARM;  Surgeon: Serafina Mitchell, MD;  Location: [place];  Service: Vascular;  Laterality: Left;  . FISTULOGRAM Left 04/07/2016   Procedure: FISTULOGRAM;  Surgeon: Waynetta Sandy, MD;  Location: [place];  Service: Vascular;  Laterality: Left;  . FRACTURE SURGERY     right ankle  . INSERTION OF DIALYSIS CATHETER Right 04/07/2016   Procedure: INSERTION OF DIALYSIS CATHETER;  Surgeon: Waynetta Sandy, MD;  Location: [place];  Service: Vascular;  Laterality: Right;  . INSERTION OF DIALYSIS CATHETER Right 04/04/2017   Procedure: INSERTION OF DIALYSIS CATHETER;  Surgeon: Waynetta Sandy, MD;  Location: [place];  Service: Vascular;  Laterality: Right;  . LIGATION OF ARTERIOVENOUS  FISTULA Left 04/14/2016   Procedure: LIGATION OF ARTERIOVENOUS  FISTULA;  Surgeon: Angelia Mould, MD;  Location: [place];  Service: Vascular;  Laterality: Left;  . PATCH ANGIOPLASTY Left 06/19/2016   Procedure: PATCH ANGIOPLASTY LEFT ARTERIOVENOUS FISTULA;  Surgeon: Angelia Mould, MD;  Location: [place];  Service: Vascular;  Laterality: Left;  . POLYPECTOMY  09/04/2017   Procedure: POLYPECTOMY;  Surgeon: Ronnette Juniper, MD;  Location: [facility] ENDOSCOPY;  Service: Gastroenterology;;  . REVISON OF ARTERIOVENOUS FISTULA Left 06/19/2016   Procedure: REVISION SUPERFICIALIZATION OF BRACHIOCEPHALIC ARTERIOVENOUS FISTULA;  Surgeon: Angelia Mould, MD;  Location: [place];  Service: Vascular;  Laterality: Left;     reports that she has never smoked. She has never used smokeless tobacco. She reports that she does not drink alcohol or use drugs.  Allergies  Allergen Reactions  . Nsaids Other (See Comments)    Cannot take due to Kidney disease, Kidney disease, Kidney function   . Tolmetin Other (See Comments)    Cannot take due  to Kidney Disease    Family History  Problem Relation Age of Onset  . Hypertension Mother   . Thyroid disease Mother   . Coronary artery disease Father   . Hypertension Father   . Diabetes Father      Prior to Admission medications   Medication Sig Start Date End Date Taking? Authorizing Provider  allopurinol (ZYLOPRIM) 100 MG tablet Take 100 mg by mouth at bedtime.    Yes [provider]  ALPRAZolam Duanne Moron) 1 MG tablet Take 1 mg by mouth 2 (two) times daily as needed for anxiety.  03/19/17  Yes [provider]  aspirin 81 MG chewable tablet Chew 1 tablet (81 mg total) by mouth daily. Patient taking differently: Chew 81 mg by mouth at bedtime.  10/03/13  Yes Eugenie Filler, MD  atorvastatin (LIPITOR) 40 MG tablet Take 40 mg by mouth at bedtime.  03/14/14  Yes [provider]  calcitRIOL (ROCALTROL) 0.5 MCG capsule Take 1 capsule (0.5 mcg total) by mouth daily with breakfast. Patient taking differently: Take 0.5 mcg by mouth at bedtime.  06/04/17  Yes Bonnielee Haff, MD  cinacalcet (SENSIPAR) 30 MG tablet Take 1 tablet (30 mg total) by mouth daily. 08/19/17  Yes Shelly Coss, MD  diphenhydramine-acetaminophen (TYLENOL PM) 25-500 MG TABS tablet Take 2 tablets by mouth at bedtime as needed (sleep).  Yes [provider]  fluticasone (FLONASE) 50 MCG/ACT nasal spray Place 1 spray into both nostrils daily as needed for allergies or rhinitis.   Yes [provider]  gentamicin cream (GARAMYCIN) 0.1 % Apply 1 application topically daily.   Yes [provider]  lisinopril (PRINIVIL,ZESTRIL) 20 MG tablet Take 10 mg by mouth at bedtime.    Yes [provider]  metoprolol tartrate (LOPRESSOR) 25 MG tablet Take 25 mg by mouth every evening.  04/17/16  Yes [provider]  multivitamin (RENA-VIT) TABS tablet Take 1 tablet by mouth at bedtime.    Yes [provider]  mupirocin ointment (BACTROBAN) 2 % Apply 1 application  topically 3 (three) times daily. 08/27/17  Yes [provider]  ondansetron (ZOFRAN) 8 MG tablet Take 8 mg by mouth every 8 (eight) hours as needed for nausea or vomiting. 07/20/17  Yes [provider]  pantoprazole (PROTONIX) 40 MG tablet Take 1 tablet (40 mg total) by mouth daily. 09/04/17  Yes Shelly Coss, MD  promethazine (PHENERGAN) 12.5 MG tablet Take 12.5 mg by mouth every 6 (six) hours as needed for nausea or vomiting. 07/20/17  Yes [provider]  senna (SENOKOT) 8.6 MG TABS tablet Take 2 tablets (17.2 mg total) by mouth at bedtime. 04/15/16  Yes Nita Sells, MD  sucroferric oxyhydroxide (VELPHORO) 500 MG chewable tablet Chew 1,000 mg by mouth 3 (three) times daily with meals.    Yes [provider]  venlafaxine XR (EFFEXOR-XR) 75 MG 24 hr capsule Take 75 mg by mouth at bedtime.    Yes [provider]    Physical Exam: Vitals:   10/28/17 0234 10/28/17 0236 10/28/17 0256 10/28/17 0347  BP:   132/68 139/71  Pulse: 81  83 68  Resp:   (!) 22 16  Temp:    97.7 F (36.5 C)  TempSrc:    Oral  SpO2: 99%  100% 98%  Weight:  109.8 kg    Height:  $Remove'5\' 7"'vGelyby$  (1.702 m)      Constitutional: NAD, calm, comfortable Eyes: PERTLA, lids and conjunctivae normal ENMT: Mucous membranes are moist. Posterior pharynx clear of any exudate or lesions.   Neck: normal, supple, no masses, no thyromegaly Respiratory: clear to auscultation bilaterally, no wheezing, no crackles. Normal respiratory effort.   Cardiovascular: S1 & S2 heard, regular rate and rhythm. No extremity edema.   Abdomen: Mild distension, mild generalized tenderness, no rebound pain or guarding. Bowel sounds active.  Musculoskeletal: no clubbing / cyanosis. No joint deformity upper and lower extremities.   Skin: no significant rashes, lesions, ulcers. Warm, dry, well-perfused. Neurologic: CN 2-12 grossly intact. Sensation intact. Strength 5/5 in all 4 limbs.  Psychiatric: Alert and  oriented x 3. Calm, coopearative.    Labs on Admission: I have personally reviewed following labs and imaging studies  CBC: Recent Labs  Lab 10/27/17 2309  WBC 9.3  HGB 7.3*  HCT 23.2*  MCV 96.7  PLT 542   Basic Metabolic Panel: Recent Labs  Lab 10/27/17 2309  NA 135  K 3.7  CL 97*  CO2 23  GLUCOSE 104*  BUN 49*  CREATININE 13.75*  CALCIUM 8.1*   GFR: Estimated Creatinine Clearance: 6.7 mL/min (A) (by C-G formula based on SCr of 13.75 mg/dL (H)). Liver Function Tests: No results for input(s): AST, ALT, ALKPHOS, BILITOT, PROT, ALBUMIN in the last 168 hours. No results for input(s): LIPASE, AMYLASE in the last 168 hours. No results for input(s): AMMONIA in the last  168 hours. Coagulation Profile: No results for input(s): INR, PROTIME in the last 168 hours. Cardiac Enzymes: No results for input(s): CKTOTAL, CKMB, CKMBINDEX, TROPONINI in the last 168 hours. BNP (last 3 results) No results for input(s): PROBNP in the last 8760 hours. HbA1C: No results for input(s): HGBA1C in the last 72 hours. CBG: No results for input(s): GLUCAP in the last 168 hours. Lipid Profile: No results for input(s): CHOL, HDL, LDLCALC, TRIG, CHOLHDL, LDLDIRECT in the last 72 hours. Thyroid Function Tests: No results for input(s): TSH, T4TOTAL, FREET4, T3FREE, THYROIDAB in the last 72 hours. Anemia Panel: No results for input(s): VITAMINB12, FOLATE, FERRITIN, TIBC, IRON, RETICCTPCT in the last 72 hours. Urine analysis:    Component Value Date/Time   COLORURINE YELLOW 04/03/2016 1626   APPEARANCEUR HAZY (A) 04/03/2016 1626   LABSPEC 1.012 04/03/2016 1626   PHURINE 6.0 04/03/2016 1626   GLUCOSEU 150 (A) 04/03/2016 1626   HGBUR NEGATIVE 04/03/2016 1626   BILIRUBINUR NEGATIVE 04/03/2016 1626   KETONESUR NEGATIVE 04/03/2016 1626   PROTEINUR >=300 (A) 04/03/2016 1626   UROBILINOGEN 0.2 09/19/2014 1620   NITRITE NEGATIVE 04/03/2016 1626   LEUKOCYTESUR NEGATIVE 04/03/2016 1626   Sepsis  Labs: $Remo'@LABRCNTIP'icdGn$ ([id number]:9,TOIZTIWPYKD:9) )No results found for this or any previous visit (from the past 240 hour(s)).   Radiological Exams on Admission: Dg Chest 2 View  Result Date: 10/27/2017 CLINICAL DATA:  Central chest pain, shortness of breath, vomiting and lightheadedness this week. History of end-stage renal disease on dialysis. EXAM: CHEST - 2 VIEW COMPARISON:  Chest radiograph September 01, 2017 FINDINGS: Cardiomediastinal silhouette is normal. Low inspiratory examination. No pleural effusions or focal consolidations. Trachea projects midline and there is no pneumothorax. Soft tissue planes and included osseous structures are non-suspicious. IMPRESSION: Negative. Electronically Signed   By: Elon Alas M.D.   On: 10/27/2017 23:49    EKG: Independently reviewed. Normal sinus rhythm.   Assessment/Plan   1. Symptomatic anemia; occult GI bleeding  - Presents with insidious development of fatigue, DOE, and lightheadedness over the past week, reports that stools are always dark d/t medications  - She is found to have Hgb of 7.3, down from 8.0 in August despite iron supplementation and multiple erythropoietin injections  - Upper and lower endoscopy was performed <2 mos ago with gastritis on EGD (benign path), congested and friable colonic mucosa and polyps (path benign) on colonoscopy without bleeding  - FOBT is positive in ED  - 1 unit of RBC ordered for transfusion  - Check post-transfusion H&H, continue PPI    2. ESRD  - Does PD at home  - No hyperkalemia, uremia, acidosis, significant hypervolemia, or hypertension on admission  - SLIV, renally-dose medications   3. Hypertension  - BP at goal  - Continue lisinopril and Lopressor as tolerated    4. Anxiety  - Continue Effexor and as-needed Xanax     DVT prophylaxis: SCD's  Code Status: Full  Family Communication: Discussed with patient  Consults called: none Admission status: Observation     Vianne Bulls,  MD [facility] Pager [phone]  If 7PM-7AM, please contact night-coverage [email or web address] Password [email or web address]  10/28/2017, 4:10 AM

## 2017-10-28 NOTE — Consult Note (Signed)
[facility] Nephrology Consult note; Reason for Consult: To manage dialysis and dialysis related needs Referring Physician: Dr. Sloan Leiter.  HPI:  Patricia [name] is an 42 y.o. female with ESRD on CCPD for about 5 months followed by Dr. Jimmy Footman, symptomatic anemia, admitted for symptomatic anemia.  Patient was recently admitted at the end of August for similar problem when patient was evaluated by GI.  Patient had unremarkable upper GI endoscopy and colonoscopy.  Her hemoglobin was 7.3 on admission, dropped to 6.9 today.  Receiving 2 units of blood transfusion.  Patient reported black-colored stool with smell..  She is also on Velphoro for hyperphosphatemia. She was already evaluated by GI and plan for capsule endoscopy tomorrow.  Patient was on hemodialysis for about a year before PD.  Left upper extremity AV fistula and AV graft is not functioning.  Patient denied nausea, vomiting, chest pain, shortness of breath or abdominal pain.  Her peritoneal dialysis prescription as follows. CCPD, EDW 108.5, uses 2.5 dextrose in all bags, exchanges 5, fill volume 3L, dwell time 2 hr, last FL 3 L, Day exchanges 1, volume 3 L, day dwell time 4 hours. She does day dwell from 5-9 PM.   Past Medical History:  Diagnosis Date  . Acid reflux   . Anxiety   . Arthritis   . Dyspnea   . Dysrhythmia    tachycardia  . ESRD (end stage renal disease) ([place])    TTHSAT- [place]  . Gout   . Headache(784.0)    "related to chemo; sometimes weekly" (09/12/2013)  . High cholesterol   . History of blood transfusion "a couple"   "related to low counts"  . Hypertension   . Iron deficiency anemia    "get epogen shots q month" (02/20/2014)  . 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 Peritoneal dialysis status [facility])     Past Surgical History:  Procedure Laterality Date  . ABDOMINAL HYSTERECTOMY  2010   "laparoscopic"  . ANKLE FRACTURE  SURGERY Right 1994  . AV FISTULA PLACEMENT Left 04/14/2016   Procedure: ARTERIOVENOUS (AV) FISTULA CREATION;  Surgeon: Angelia Mould, MD;  Location[facility] OR;  Service: Vascular;  Laterality: Left;  . AV FISTULA PLACEMENT Left 08/09/2017   Procedure: INSERTION OF ARTERIOVENOUS (AV) GORE-TEX GRAFT LEFT ARM;  Surgeon: Elam Dutch, MD;  Location: [place];  Service: Vascular;  Laterality: Left;  . [place] REMOVAL Left 08/11/2017   Procedure: REMOVAL OF ARTERIOVENOUS GORETEX GRAFT ([place]);  Surgeon: Serafina Mitchell, MD;  Location: [facility] OR;  Service: Vascular;  Laterality: Left;  . BIOPSY  09/03/2017   Procedure: BIOPSY;  Surgeon: Otis Brace, MD;  Location: [place];  Service: Gastroenterology;;  . CARDIAC CATHETERIZATION  2000's  . COLONOSCOPY WITH PROPOFOL N/A 09/04/2017   Procedure: COLONOSCOPY WITH PROPOFOL;  Surgeon: Ronnette Juniper, MD;  Location: [place];  Service: Gastroenterology;  Laterality: N/A;  . ESOPHAGOGASTRODUODENOSCOPY    . ESOPHAGOGASTRODUODENOSCOPY (EGD) WITH PROPOFOL Left 06/04/2017   Procedure: ESOPHAGOGASTRODUODENOSCOPY (EGD) WITH PROPOFOL;  Surgeon: Arta Silence, MD;  Location: [place];  Service: Endoscopy;  Laterality: Left;  . ESOPHAGOGASTRODUODENOSCOPY (EGD) WITH PROPOFOL N/A 09/03/2017   Procedure: ESOPHAGOGASTRODUODENOSCOPY (EGD) WITH PROPOFOL;  Surgeon: Otis Brace, MD;  Location: [place];  Service: Gastroenterology;  Laterality: N/A;  . FISTULA SUPERFICIALIZATION Left 04/02/2017   Procedure: FISTULA SUPERFICIALIZATION LEFT ARM;  Surgeon: Serafina Mitchell, MD;  Location: [place];  Service: Vascular;  Laterality: Left;  .  FISTULOGRAM Left 04/07/2016   Procedure: FISTULOGRAM;  Surgeon: Waynetta Sandy, MD;  Location: [place];  Service: Vascular;  Laterality: Left;  . FRACTURE SURGERY     right ankle  . INSERTION OF DIALYSIS CATHETER Right 04/07/2016   Procedure: INSERTION OF DIALYSIS CATHETER;  Surgeon: Waynetta Sandy, MD;  Location:  [place];  Service: Vascular;  Laterality: Right;  . INSERTION OF DIALYSIS CATHETER Right 04/04/2017   Procedure: INSERTION OF DIALYSIS CATHETER;  Surgeon: Waynetta Sandy, MD;  Location: [place];  Service: Vascular;  Laterality: Right;  . LIGATION OF ARTERIOVENOUS  FISTULA Left 04/14/2016   Procedure: LIGATION OF ARTERIOVENOUS  FISTULA;  Surgeon: Angelia Mould, MD;  Location: [place];  Service: Vascular;  Laterality: Left;  . PATCH ANGIOPLASTY Left 06/19/2016   Procedure: PATCH ANGIOPLASTY LEFT ARTERIOVENOUS FISTULA;  Surgeon: Angelia Mould, MD;  Location: [place];  Service: Vascular;  Laterality: Left;  . POLYPECTOMY  09/04/2017   Procedure: POLYPECTOMY;  Surgeon: Ronnette Juniper, MD;  Location: [facility] ENDOSCOPY;  Service: Gastroenterology;;  . REVISON OF ARTERIOVENOUS FISTULA Left 06/19/2016   Procedure: REVISION SUPERFICIALIZATION OF BRACHIOCEPHALIC ARTERIOVENOUS FISTULA;  Surgeon: Angelia Mould, MD;  Location: [facility] OR;  Service: Vascular;  Laterality: Left;    Family History  Problem Relation Age of Onset  . Hypertension Mother   . Thyroid disease Mother   . Coronary artery disease Father   . Hypertension Father   . Diabetes Father     Social History:  reports that she has never smoked. She has never used smokeless tobacco. She reports that she does not drink alcohol or use drugs.  Allergies:  Allergies  Allergen Reactions  . Nsaids Other (See Comments)    Cannot take due to Kidney disease, Kidney disease, Kidney function   . Tolmetin Other (See Comments)    Cannot take due to Kidney Disease    Medications: I have reviewed the patient's current medications.   Results for orders placed or performed during the hospital encounter of 10/27/17 (from the past 48 hour(s))  Basic metabolic panel     Status: Abnormal   Collection Time: 10/27/17 11:09 PM  Result Value Ref Range   Sodium 135 135 - 145 mmol/L   Potassium 3.7 3.5 - 5.1 mmol/L   Chloride 97 (L) 98 - 111  mmol/L   CO2 23 22 - 32 mmol/L   Glucose, Bld 104 (H) 70 - 99 mg/dL   BUN 49 (H) 6 - 20 mg/dL   Creatinine, Ser 13.75 (H) 0.44 - 1.00 mg/dL   Calcium 8.1 (L) 8.9 - 10.3 mg/dL   GFR calc non Af Amer 3 (L) >60 mL/min   GFR calc Af Amer 3 (L) >60 mL/min    Comment: (NOTE) The eGFR has been calculated using the CKD EPI equation. This calculation has not been validated in all clinical situations. eGFR's persistently <60 mL/min signify possible Chronic Kidney Disease.    Anion gap 15 5 - 15    Comment: Performed at [facility] [address] [place] [address]  CBC     Status: Abnormal   Collection Time: 10/27/17 11:09 PM  Result Value Ref Range   WBC 9.3 4.0 - 10.5 K/uL   RBC 2.40 (L) 3.87 - 5.11 MIL/uL   Hemoglobin 7.3 (L) 12.0 - 15.0 g/dL   HCT 23.2 (L) 36.0 - 46.0 %   MCV 96.7 80.0 - 100.0 fL   MCH 30.4 26.0 - 34.0 pg  MCHC 31.5 30.0 - 36.0 g/dL   RDW 18.1 (H) 11.5 - 15.5 %   Platelets 332 150 - 400 K/uL   nRBC 1.1 (H) 0.0 - 0.2 %    Comment: Performed at [facility] [address] [place], [place] [address]  I-stat troponin, ED     Status: None   Collection Time: 10/27/17 11:34 PM  Result Value Ref Range   Troponin i, poc 0.01 0.00 - 0.08 ng/mL   Comment 3            Comment: Due to the release kinetics of cTnI, a negative result within the first hours of the onset of symptoms does not rule out myocardial infarction with certainty. If myocardial infarction is still suspected, repeat the test at appropriate intervals.   POC occult blood, ED     Status: Abnormal   Collection Time: 10/28/17  2:18 AM  Result Value Ref Range   Fecal Occult Bld POSITIVE (A) NEGATIVE  Type and screen     Status: None (Preliminary result)   Collection Time: 10/28/17  2:45 AM  Result Value Ref Range   ABO/RH(D) O NEG    Antibody Screen NEG    Sample Expiration 10/31/2017    Unit Number [id number]    Blood Component Type RED CELLS,LR    Unit division 00     Status of Unit ISSUED    Transfusion Status OK TO TRANSFUSE    Crossmatch Result Compatible    Unit Number [id number]    Blood Component Type RED CELLS,LR    Unit division 00    Status of Unit ISSUED    Transfusion Status OK TO TRANSFUSE    Crossmatch Result      Compatible Performed at [facility], [place] [address] [place], [place] [address]   Prepare RBC     Status: None   Collection Time: 10/28/17  2:45 AM  Result Value Ref Range   Order Confirmation      ORDER PROCESSED BY BLOOD BANK Performed at [facility], [place] [address], [place], [place] [address]   Hemoglobin and hematocrit, blood     Status: Abnormal   Collection Time: 10/28/17  9:43 AM  Result Value Ref Range   Hemoglobin 6.9 (LL) 12.0 - 15.0 g/dL    Comment: REPEATED TO VERIFY THIS CRITICAL RESULT HAS VERIFIED AND BEEN CALLED TO SERGIO RAMIREZ,RN BY ZELDA BEECH ON 10 24 2019 AT 1104, AND HAS BEEN READ BACK. CRITICAL VALUE VERIFIED    HCT 22.5 (L) 36.0 - 46.0 %    Comment: Performed at [facility], [place] [address] [place], [place] [address]  Prepare RBC     Status: None   Collection Time: 10/28/17 11:39 AM  Result Value Ref Range   Order Confirmation      ORDER PROCESSED BY BLOOD BANK Performed at [facility], [place] [address] [place], [name] [address]     Dg Chest 2 View  Result Date: 10/27/2017 CLINICAL DATA:  Central chest pain, shortness of breath, vomiting and lightheadedness this week. History of end-stage renal disease on dialysis. EXAM: CHEST - 2 VIEW COMPARISON:  Chest radiograph September 01, 2017 FINDINGS: Cardiomediastinal silhouette is normal. Low inspiratory examination. No pleural effusions or focal consolidations. Trachea projects midline and there is no pneumothorax. Soft tissue planes and included osseous structures are non-suspicious. IMPRESSION: Negative. Electronically Signed   By: Elon Alas M.D.   On: 10/27/2017 23:49  ROS:  Blood pressure 110/65, pulse  81, temperature 98.2 F (36.8 C), temperature source Oral, resp. rate 18, height '5\' 7"'  (1.702 m), weight 109.8 kg, SpO2 100 %. General: Lying in bed comfortable, not in distress HEENT: PERRLA Cardiovascular: Regular rate rhythm, S1-S2 normal, no rubs Respiratory: Clear bilateral, no wheezing GI: Abdomen soft, nontender.  PD catheter is well dressed, no discharge or tenderness. Extremities: No edema Neuro: Alert, awake, oriented and following commands.  Assessment/Plan: 1 Anemia multifactorial etiology including GI bleed and kidney disease: iron sat 48 % on 10/07/17. As per [facility] dialysis unit, pt got Mircera 225 mcg on 10/18/2017. GI elevation ongoing, plan for capsule endoscopy tomorrow.  Patient is receiving 2 units of blood transfusion today. Please note that velphoro can also cause black colored stool.   2 ESRD: Resume CCPD tonight.  No midday exchange tomorrow since patient might go for GI procedure.  3 Hypertension: BP acceptable.  Patient is euvolemic on exam.  She uses 2.5% dextrose on PD.  4. Metabolic Bone Disease: Calcitriol, Sensipar and Velphoro.   Dron Tanna Furry 10/28/2017, 2:51 PM

## 2017-10-28 NOTE — Progress Notes (Addendum)
PD tx initiated via tenckhoff w/o problem VSS Report given to primary nurse Also, : there was a computer error that wouldn't allow me to document the dialysate fluid unless I linked it to a line. Since epic does not have a choice for tenckhoff/PD cath for [place] to choose from we normally skip the linking step. For some reason it wouldn't allow me to and the only choices are 2 PIV, spoke w/ [facility] and they couldn't figure out what was wrong or how to fix it. I was advised to link it to one of the PIV and then enter a comment on the admin screen and then a progress note explaining the fluid was linked to tenckhoff and not the PIV and why.  Also when looking up pt's hepatitis results from the clinic for my documentation I found that both the Hep BsAG and Hep BsAB are out of date since June 11, 2017, I ordered new labs drawn.

## 2017-10-28 NOTE — Consult Note (Signed)
Referring Provider: Dr. Sloan Leiter Primary Care Physician:  Blair Heys, PA-C Primary Gastroenterologist:  Dr. Alessandra Bevels  Reason for Consultation:  Anemia  HPI: Patricia [name] is a 42 y.o. female with ESRD on peritoneal dialysis who reports chronic black stools being seen for anemia and melena. Reports several days of abdominal pain for several days and nausea/vomiting. Denies hematochezia. S/P EGD/colon in August by Dr. Alessandra Bevels that showed gastritis and duodenitis and a colonoscopy where 2 sessile serrated polyps were removed and showed nonspecific colitis. Hemoccult positive. Hgb 7.3 yesterday and 6.9 today (8.0 on 09/04/17). Denies NSAIDs.   Past Medical History:  Diagnosis Date  . Acid reflux   . Anxiety   . Arthritis   . Dyspnea   . Dysrhythmia    tachycardia  . ESRD (end stage renal disease) ([place])    TTHSAT- [name]  . Gout   . Headache(784.0)    "related to chemo; sometimes weekly" (09/12/2013)  . High cholesterol   . History of blood transfusion "a couple"   "related to low counts"  . Hypertension   . Iron deficiency anemia    "get epogen shots q month" (02/20/2014)  . 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 Peritoneal dialysis status [facility])     Past Surgical History:  Procedure Laterality Date  . ABDOMINAL HYSTERECTOMY  2010   "laparoscopic"  . ANKLE FRACTURE SURGERY Right 1994  . AV FISTULA PLACEMENT Left 04/14/2016   Procedure: ARTERIOVENOUS (AV) FISTULA CREATION;  Surgeon: Angelia Mould, MD;  Location: [facility] OR;  Service: Vascular;  Laterality: Left;  . AV FISTULA PLACEMENT Left 08/09/2017   Procedure: INSERTION OF ARTERIOVENOUS (AV) GORE-TEX GRAFT LEFT ARM;  Surgeon: Elam Dutch, MD;  Location: [place];  Service: Vascular;  Laterality: Left;  . [place] REMOVAL Left 08/11/2017   Procedure: REMOVAL OF ARTERIOVENOUS GORETEX GRAFT ([place]);  Surgeon: Serafina Mitchell, MD;  Location: [facility] OR;  Service:  Vascular;  Laterality: Left;  . BIOPSY  09/03/2017   Procedure: BIOPSY;  Surgeon: Otis Brace, MD;  Location: [place];  Service: Gastroenterology;;  . CARDIAC CATHETERIZATION  2000's  . COLONOSCOPY WITH PROPOFOL N/A 09/04/2017   Procedure: COLONOSCOPY WITH PROPOFOL;  Surgeon: Ronnette Juniper, MD;  Location: [place];  Service: Gastroenterology;  Laterality: N/A;  . ESOPHAGOGASTRODUODENOSCOPY    . ESOPHAGOGASTRODUODENOSCOPY (EGD) WITH PROPOFOL Left 06/04/2017   Procedure: ESOPHAGOGASTRODUODENOSCOPY (EGD) WITH PROPOFOL;  Surgeon: Arta Silence, MD;  Location: [place];  Service: Endoscopy;  Laterality: Left;  . ESOPHAGOGASTRODUODENOSCOPY (EGD) WITH PROPOFOL N/A 09/03/2017   Procedure: ESOPHAGOGASTRODUODENOSCOPY (EGD) WITH PROPOFOL;  Surgeon: Otis Brace, MD;  Location: [place];  Service: Gastroenterology;  Laterality: N/A;  . FISTULA SUPERFICIALIZATION Left 04/02/2017   Procedure: FISTULA SUPERFICIALIZATION LEFT ARM;  Surgeon: Serafina Mitchell, MD;  Location: [place];  Service: Vascular;  Laterality: Left;  . FISTULOGRAM Left 04/07/2016   Procedure: FISTULOGRAM;  Surgeon: Waynetta Sandy, MD;  Location: [place];  Service: Vascular;  Laterality: Left;  . FRACTURE SURGERY     right ankle  . INSERTION OF DIALYSIS CATHETER Right 04/07/2016   Procedure: INSERTION OF DIALYSIS CATHETER;  Surgeon: Waynetta Sandy, MD;  Location: [place];  Service: Vascular;  Laterality: Right;  . INSERTION OF DIALYSIS CATHETER Right 04/04/2017   Procedure: INSERTION OF DIALYSIS CATHETER;  Surgeon: Waynetta Sandy, MD;  Location: [place];  Service: Vascular;  Laterality: Right;  . LIGATION OF ARTERIOVENOUS  FISTULA Left  04/14/2016   Procedure: LIGATION OF ARTERIOVENOUS  FISTULA;  Surgeon: Chuck Hint, MD;  Location: [facility] OR;  Service: Vascular;  Laterality: Left;  . PATCH ANGIOPLASTY Left 06/19/2016   Procedure: PATCH ANGIOPLASTY LEFT ARTERIOVENOUS FISTULA;  Surgeon: Chuck Hint, MD;  Location: [facility] OR;  Service: Vascular;  Laterality: Left;  . POLYPECTOMY  09/04/2017   Procedure: POLYPECTOMY;  Surgeon: Kerin Salen, MD;  Location: [facility] ENDOSCOPY;  Service: Gastroenterology;;  . REVISON OF ARTERIOVENOUS FISTULA Left 06/19/2016   Procedure: REVISION SUPERFICIALIZATION OF BRACHIOCEPHALIC ARTERIOVENOUS FISTULA;  Surgeon: Chuck Hint, MD;  Location: [facility] OR;  Service: Vascular;  Laterality: Left;    Prior to Admission medications   Medication Sig Start Date End Date Taking? Authorizing Provider  allopurinol (ZYLOPRIM) 100 MG tablet Take 100 mg by mouth at bedtime.    Yes [provider]  ALPRAZolam Prudy Feeler) 1 MG tablet Take 1 mg by mouth 2 (two) times daily as needed for anxiety.  03/19/17  Yes [provider]  aspirin 81 MG chewable tablet Chew 1 tablet (81 mg total) by mouth daily. Patient taking differently: Chew 81 mg by mouth at bedtime.  10/03/13  Yes Rodolph Bong, MD  atorvastatin (LIPITOR) 40 MG tablet Take 40 mg by mouth at bedtime.  03/14/14  Yes [provider]  calcitRIOL (ROCALTROL) 0.5 MCG capsule Take 1 capsule (0.5 mcg total) by mouth daily with breakfast. Patient taking differently: Take 0.5 mcg by mouth at bedtime.  06/04/17  Yes Osvaldo Shipper, MD  cinacalcet (SENSIPAR) 30 MG tablet Take 1 tablet (30 mg total) by mouth daily. 08/19/17  Yes Burnadette Pop, MD  diphenhydramine-acetaminophen (TYLENOL PM) 25-500 MG TABS tablet Take 2 tablets by mouth at bedtime as needed (sleep).    Yes [provider]  fluticasone (FLONASE) 50 MCG/ACT nasal spray Place 1 spray into both nostrils daily as needed for allergies or rhinitis.   Yes [provider]  gentamicin cream (GARAMYCIN) 0.1 % Apply 1 application topically daily.   Yes [provider]  lisinopril (PRINIVIL,ZESTRIL) 20 MG tablet Take 10 mg by mouth at bedtime.    Yes [provider]  metoprolol tartrate (LOPRESSOR) 25 MG tablet  Take 25 mg by mouth every evening.  04/17/16  Yes [provider]  multivitamin (RENA-VIT) TABS tablet Take 1 tablet by mouth at bedtime.    Yes [provider]  mupirocin ointment (BACTROBAN) 2 % Apply 1 application topically 3 (three) times daily. 08/27/17  Yes [provider]  ondansetron (ZOFRAN) 8 MG tablet Take 8 mg by mouth every 8 (eight) hours as needed for nausea or vomiting. 07/20/17  Yes [provider]  pantoprazole (PROTONIX) 40 MG tablet Take 1 tablet (40 mg total) by mouth daily. 09/04/17  Yes Burnadette Pop, MD  promethazine (PHENERGAN) 12.5 MG tablet Take 12.5 mg by mouth every 6 (six) hours as needed for nausea or vomiting. 07/20/17  Yes [provider]  senna (SENOKOT) 8.6 MG TABS tablet Take 2 tablets (17.2 mg total) by mouth at bedtime. 04/15/16  Yes Rhetta Mura, MD  sucroferric oxyhydroxide (VELPHORO) 500 MG chewable tablet Chew 1,000 mg by mouth 3 (three) times daily with meals.    Yes [provider]  venlafaxine XR (EFFEXOR-XR) 75 MG 24 hr capsule Take 75 mg by mouth at bedtime.    Yes [provider]    Scheduled Meds: . sodium chloride   Intravenous Once  . atorvastatin  40 mg Oral q1800  .  calcitRIOL  0.5 mcg Oral QHS  . cinacalcet  30 mg Oral Q breakfast  . lisinopril  10 mg Oral QHS  . metoprolol tartrate  25 mg Oral QPM  . multivitamin  1 tablet Oral QHS  . pantoprazole  40 mg Oral Daily  . sodium chloride flush  3 mL Intravenous Q12H  . sodium chloride flush  3 mL Intravenous Q12H  . sucroferric oxyhydroxide  1,000 mg Oral TID WC  . venlafaxine XR  75 mg Oral QHS   Continuous Infusions: . sodium chloride     PRN Meds:.sodium chloride, acetaminophen **OR** acetaminophen, ALPRAZolam, HYDROcodone-acetaminophen, ondansetron **OR** ondansetron (ZOFRAN) IV, sodium chloride flush  Allergies as of 10/27/2017 - Review Complete 10/27/2017  Allergen Reaction Noted  . Nsaids Other (See Comments)  01/27/2013  . Tolmetin Other (See Comments) 07/20/2015    Family History  Problem Relation Age of Onset  . Hypertension Mother   . Thyroid disease Mother   . Coronary artery disease Father   . Hypertension Father   . Diabetes Father     Social History   Socioeconomic History  . Marital status: Single    Spouse name: Not on file  . Number of children: Not on file  . Years of education: Not on file  . Highest education level: Not on file  Occupational History  . Occupation: disabled  Social Needs  . Financial resource strain: Not on file  . Food insecurity:    Worry: Not on file    Inability: Not on file  . Transportation needs:    Medical: Not on file    Non-medical: Not on file  Tobacco Use  . Smoking status: Never Smoker  . Smokeless tobacco: Never Used  Substance and Sexual Activity  . Alcohol use: No  . Drug use: No  . Sexual activity: Not Currently    Birth control/protection: Surgical  Lifestyle  . Physical activity:    Days per week: Not on file    Minutes per session: Not on file  . Stress: Not on file  Relationships  . Social connections:    Talks on phone: Not on file    Gets together: Not on file    Attends religious service: Not on file    Active member of club or organization: Not on file    Attends meetings of clubs or organizations: Not on file    Relationship status: Not on file  . Intimate partner violence:    Fear of current or ex partner: No    Emotionally abused: No    Physically abused: No    Forced sexual activity: No  Other Topics Concern  . Not on file  Social History Narrative  . Not on file    Review of Systems: All negative except as stated above in HPI.  Physical Exam: Vital signs: Vitals:   10/28/17 0641 10/28/17 1250  BP: 125/81 129/82  Pulse: 92 89  Resp: 17 16  Temp: 98.8 F (37.1 C) 98.2 F (36.8 C)  SpO2: 99% 99%   Last BM Date: 10/27/17 General:   Alert, obese, pleasant and cooperative in NAD Head:  normocephalic, atraumatic Eyes: anicteric sclera ENT: oropharynx clear Neck: supple, nontender Lungs:  Clear throughout to auscultation.   No wheezes, crackles, or rhonchi. No acute distress. Heart:  Regular rate and rhythm; no murmurs, clicks, rubs,  or gallops. Abdomen: diffuse tenderness with guarding, soft, nondistended, +BS, obese, peritoneal catheter dressing not removed  Rectal:  Deferred Ext: no edema  GI:  Lab Results: Recent Labs    10/27/17 2309 10/28/17 0943  WBC 9.3  --   HGB 7.3* 6.9*  HCT 23.2* 22.5*  PLT 332  --    BMET Recent Labs    10/27/17 2309  NA 135  K 3.7  CL 97*  CO2 23  GLUCOSE 104*  BUN 49*  CREATININE 13.75*  CALCIUM 8.1*   LFT No results for input(s): PROT, ALBUMIN, AST, ALT, ALKPHOS, BILITOT, BILIDIR, IBILI in the last 72 hours. PT/INR No results for input(s): LABPROT, INR in the last 72 hours.   Studies/Results: Dg Chest 2 View  Result Date: 10/27/2017 CLINICAL DATA:  Central chest pain, shortness of breath, vomiting and lightheadedness this week. History of end-stage renal disease on dialysis. EXAM: CHEST - 2 VIEW COMPARISON:  Chest radiograph September 01, 2017 FINDINGS: Cardiomediastinal silhouette is normal. Low inspiratory examination. No pleural effusions or focal consolidations. Trachea projects midline and there is no pneumothorax. Soft tissue planes and included osseous structures are non-suspicious. IMPRESSION: Negative. Electronically Signed   By: Elon Alas M.D.   On: 10/27/2017 23:49    Impression/Plan: Recurrent anemia with unrevealing colonoscopy/EGD in late August. Question small bowel AVMs and will do a capsule endoscopy tomorrow. Supportive care.    LOS: 0 days   [name]  10/28/2017, 1:20 PM  Questions please call [phone]

## 2017-10-28 NOTE — ED Provider Notes (Signed)
[place] EMERGENCY DEPARTMENT Provider Note   CSN: [id number] Arrival date & time: 10/27/17  2253     History   Chief Complaint Chief Complaint  Patient presents with  . Chest Pain    HPI Patricia [name] is a 42 y.o. female.  Patient presents to the emergency department for evaluation of weakness, dizziness, shortness of breath and exertional chest pain.  Symptoms ongoing for the last several days.  Patient reports a history of anemia, states that she felt similar when she required blood transfusion several months ago.  She has not seen any obvious bleeding in her stools.     Past Medical History:  Diagnosis Date  . Acid reflux   . Anxiety   . Arthritis   . Dyspnea   . Dysrhythmia    tachycardia  . ESRD (end stage renal disease) ([place])    [facility]SAT- [place]  . Gout   . Headache(784.0)    "related to chemo; sometimes weekly" (09/12/2013)  . High cholesterol   . History of blood transfusion "a couple"   "related to low counts"  . Hypertension   . Iron deficiency anemia    "get epogen shots q month" (02/20/2014)  . 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 Peritoneal dialysis status [facility])     Patient Active Problem List   Diagnosis Date Noted  . Esophageal candidiasis ([place]) 09/01/2017  . Anxiety 09/01/2017  . Stroke ([place]) 08/15/2017  . Dysphagia 08/15/2017  . Melena 06/03/2017  . Abdominal pain 06/03/2017  . Dialysis AV fistula malfunction ([place]) 04/04/2017  . ESRD (end stage renal disease) ([place]) 04/01/2016  . Anemia due to chronic kidney disease 04/01/2016  . GERD (gastroesophageal reflux disease) 04/01/2016  . Hypomagnesemia 04/28/2015  . Acute renal failure superimposed on stage 3 chronic kidney disease ([place]) 04/12/2014  . Atypical chest pain   . Chest pain 02/19/2014  . Hypokalemia 02/19/2014  . Nephrotic syndrome 10/02/2013  . Dyslipidemia 10/02/2013  . PAT (paroxysmal atrial  tachycardia) ([place]) 09/29/2013  . Normocytic anemia 06/29/2013  . Edema 06/29/2013  . Nausea and vomiting 06/29/2013  . Fever, unspecified 01/31/2013  . Glomerulonephritis 01/28/2013  . Syncope 01/28/2013  . Shortness of breath 07/04/2011  . Morbid obesity ([place]) 07/04/2011  . HTN (hypertension) 07/04/2011  . Hypercholesterolemia 07/04/2011    Past Surgical History:  Procedure Laterality Date  . ABDOMINAL HYSTERECTOMY  2010   "laparoscopic"  . ANKLE FRACTURE SURGERY Right 1994  . AV FISTULA PLACEMENT Left 04/14/2016   Procedure: ARTERIOVENOUS (AV) FISTULA CREATION;  Surgeon: Angelia Mould, MD;  Location[facility];  Service: Vascular;  Laterality: Left;  . AV FISTULA PLACEMENT Left 08/09/2017   Procedure: INSERTION OF ARTERIOVENOUS (AV) GORE-TEX GRAFT LEFT ARM;  Surgeon: Elam Dutch, MD;  Location: [place];  Service: Vascular;  Laterality: Left;  . [place] REMOVAL Left 08/11/2017   Procedure: REMOVAL OF ARTERIOVENOUS GORETEX GRAFT ([place]);  Surgeon: Serafina Mitchell, MD;  Location: [facility] OR;  Service: Vascular;  Laterality: Left;  . BIOPSY  09/03/2017   Procedure: BIOPSY;  Surgeon: Otis Brace, MD;  Location: [place];  Service: Gastroenterology;;  . CARDIAC CATHETERIZATION  2000's  . COLONOSCOPY WITH PROPOFOL N/A 09/04/2017   Procedure: COLONOSCOPY WITH PROPOFOL;  Surgeon: Ronnette Juniper, MD;  Location: [place];  Service: Gastroenterology;  Laterality: N/A;  . ESOPHAGOGASTRODUODENOSCOPY    . ESOPHAGOGASTRODUODENOSCOPY (EGD) WITH PROPOFOL Left 06/04/2017   Procedure: ESOPHAGOGASTRODUODENOSCOPY (  EGD) WITH PROPOFOL;  Surgeon: Arta Silence, MD;  Location: [facility];  Service: Endoscopy;  Laterality: Left;  . ESOPHAGOGASTRODUODENOSCOPY (EGD) WITH PROPOFOL N/A 09/03/2017   Procedure: ESOPHAGOGASTRODUODENOSCOPY (EGD) WITH PROPOFOL;  Surgeon: Otis Brace, MD;  Location: [place];  Service: Gastroenterology;  Laterality: N/A;  . FISTULA SUPERFICIALIZATION Left 04/02/2017    Procedure: FISTULA SUPERFICIALIZATION LEFT ARM;  Surgeon: Serafina Mitchell, MD;  Location: [place];  Service: Vascular;  Laterality: Left;  . FISTULOGRAM Left 04/07/2016   Procedure: FISTULOGRAM;  Surgeon: Waynetta Sandy, MD;  Location: [place];  Service: Vascular;  Laterality: Left;  . FRACTURE SURGERY     right ankle  . INSERTION OF DIALYSIS CATHETER Right 04/07/2016   Procedure: INSERTION OF DIALYSIS CATHETER;  Surgeon: Waynetta Sandy, MD;  Location: [place];  Service: Vascular;  Laterality: Right;  . INSERTION OF DIALYSIS CATHETER Right 04/04/2017   Procedure: INSERTION OF DIALYSIS CATHETER;  Surgeon: Waynetta Sandy, MD;  Location: [place];  Service: Vascular;  Laterality: Right;  . LIGATION OF ARTERIOVENOUS  FISTULA Left 04/14/2016   Procedure: LIGATION OF ARTERIOVENOUS  FISTULA;  Surgeon: Angelia Mould, MD;  Location: [place];  Service: Vascular;  Laterality: Left;  . PATCH ANGIOPLASTY Left 06/19/2016   Procedure: PATCH ANGIOPLASTY LEFT ARTERIOVENOUS FISTULA;  Surgeon: Angelia Mould, MD;  Location: [place];  Service: Vascular;  Laterality: Left;  . POLYPECTOMY  09/04/2017   Procedure: POLYPECTOMY;  Surgeon: Ronnette Juniper, MD;  Location: [facility] ENDOSCOPY;  Service: Gastroenterology;;  . REVISON OF ARTERIOVENOUS FISTULA Left 06/19/2016   Procedure: REVISION SUPERFICIALIZATION OF BRACHIOCEPHALIC ARTERIOVENOUS FISTULA;  Surgeon: Angelia Mould, MD;  Location: [facility] OR;  Service: Vascular;  Laterality: Left;     OB History    Gravida  3   Para      Term      Preterm      AB      Living        SAB      TAB      Ectopic      Multiple  3   Live Births               Home Medications    Prior to Admission medications   Medication Sig Start Date End Date Taking? Authorizing Provider  allopurinol (ZYLOPRIM) 100 MG tablet Take 100 mg by mouth at bedtime.     [provider]  ALPRAZolam Duanne Moron) 1 MG tablet Take 1 mg by mouth 2 (two)  times daily as needed for anxiety.  03/19/17   [provider]  aspirin 81 MG chewable tablet Chew 1 tablet (81 mg total) by mouth daily. Patient taking differently: Chew 81 mg by mouth at bedtime.  10/03/13   Eugenie Filler, MD  atorvastatin (LIPITOR) 40 MG tablet Take 40 mg by mouth at bedtime.  03/14/14   [provider]  calcitRIOL (ROCALTROL) 0.5 MCG capsule Take 1 capsule (0.5 mcg total) by mouth daily with breakfast. Patient taking differently: Take 0.5 mcg by mouth at bedtime.  06/04/17   Bonnielee Haff, MD  cinacalcet (SENSIPAR) 30 MG tablet Take 1 tablet (30 mg total) by mouth daily. 08/19/17   Shelly Coss, MD  diphenhydramine-acetaminophen (TYLENOL PM) 25-500 MG TABS tablet Take 2 tablets by mouth at bedtime.    [provider]  fluticasone (FLONASE) 50 MCG/ACT nasal spray Place 1 spray into both nostrils daily as needed for allergies or rhinitis.    [provider]  gentamicin cream (GARAMYCIN) 0.1 % Apply 1 application topically daily.    [provider]  lisinopril (PRINIVIL,ZESTRIL) 20 MG tablet Take 20 mg by mouth at bedtime.     [provider]  metoprolol tartrate (LOPRESSOR) 25 MG tablet Take 25 mg by mouth daily as needed (for high BP > 140).  04/17/16   [provider]  multivitamin (RENA-VIT) TABS tablet Take 1 tablet by mouth at bedtime.     [provider]  mupirocin ointment (BACTROBAN) 2 % Apply 1 application topically 3 (three) times daily. 08/27/17   [provider]  ondansetron (ZOFRAN) 8 MG tablet Take 8 mg by mouth every 8 (eight) hours as needed for nausea or vomiting. 07/20/17   [provider]  oxyCODONE-acetaminophen (PERCOCET/ROXICET) 5-325 MG tablet Take 1-2 tablets by mouth every 6 (six) hours as needed for severe pain. Patient not taking: Reported on 09/16/2017 08/09/17   Sherren Kerns, MD  pantoprazole (PROTONIX) 40 MG tablet Take 1 tablet (40 mg total) by mouth daily.  09/04/17   Burnadette Pop, MD  promethazine (PHENERGAN) 12.5 MG tablet Take 12.5 mg by mouth every 6 (six) hours as needed for nausea or vomiting. 07/20/17   [provider]  senna (SENOKOT) 8.6 MG TABS tablet Take 2 tablets (17.2 mg total) by mouth at bedtime. 04/15/16   Rhetta Mura, MD  sucroferric oxyhydroxide (VELPHORO) 500 MG chewable tablet Chew 500 mg by mouth 3 (three) times daily with meals.    [provider]  venlafaxine XR (EFFEXOR-XR) 75 MG 24 hr capsule Take 75 mg by mouth at bedtime.     [provider]    Family History Family History  Problem Relation Age of Onset  . Hypertension Mother   . Thyroid disease Mother   . Coronary artery disease Father   . Hypertension Father   . Diabetes Father     Social History Social History   Tobacco Use  . Smoking status: Never Smoker  . Smokeless tobacco: Never Used  Substance Use Topics  . Alcohol use: No  . Drug use: No     Allergies   Nsaids and Tolmetin   Review of Systems Review of Systems  Respiratory: Positive for shortness of breath.   Cardiovascular: Positive for chest pain.  Neurological: Positive for dizziness.  All other systems reviewed and are negative.    Physical Exam Updated Vital Signs BP 120/68 (BP Location: Right Arm)   Pulse 77   Temp 98.5 F (36.9 C) (Oral)   Resp 17   SpO2 99%   Physical Exam  Constitutional: She is oriented to person, place, and time. She appears well-developed and well-nourished. No distress.  HENT:  Head: Normocephalic and atraumatic.  Right Ear: Hearing normal.  Left Ear: Hearing normal.  Nose: Nose normal.  Mouth/Throat: Oropharynx is clear and moist and mucous membranes are normal.  Eyes: Pupils are equal, round, and reactive to light. Conjunctivae and EOM are normal.  Neck: Normal range of motion. Neck supple.  Cardiovascular: Regular rhythm, S1 normal and S2 normal. Exam reveals no gallop and no friction rub.  No murmur  heard. Pulmonary/Chest: Effort normal and breath sounds normal. No respiratory distress. She exhibits no tenderness.  Abdominal: Soft. Normal appearance and bowel sounds are normal. There is no hepatosplenomegaly. There is no tenderness. There is no rebound, no guarding, no tenderness at McBurney's point and negative Murphy's sign. No hernia.  Musculoskeletal: Normal range of motion.  Neurological: She is alert and oriented  to person, place, and time. She has normal strength. No cranial nerve deficit or sensory deficit. Coordination normal. GCS eye subscore is 4. GCS verbal subscore is 5. GCS motor subscore is 6.  Skin: Skin is warm, dry and intact. No rash noted. No cyanosis.  Psychiatric: She has a normal mood and affect. Her speech is normal and behavior is normal. Thought content normal.  Nursing note and vitals reviewed.    ED Treatments / Results  Labs (all labs ordered are listed, but only abnormal results are displayed) Labs Reviewed  BASIC METABOLIC PANEL - Abnormal; Notable for the following components:      Result Value   Chloride 97 (*)    Glucose, Bld 104 (*)    BUN 49 (*)    Creatinine, Ser 13.75 (*)    Calcium 8.1 (*)    GFR calc non Af Amer 3 (*)    GFR calc Af Amer 3 (*)    All other components within normal limits  CBC - Abnormal; Notable for the following components:   RBC 2.40 (*)    Hemoglobin 7.3 (*)    HCT 23.2 (*)    RDW 18.1 (*)    nRBC 1.1 (*)    All other components within normal limits  POC OCCULT BLOOD, ED - Abnormal; Notable for the following components:   Fecal Occult Bld POSITIVE (*)    All other components within normal limits  I-STAT TROPONIN, ED  TYPE AND SCREEN    EKG None  Radiology Dg Chest 2 View  Result Date: 10/27/2017 CLINICAL DATA:  Central chest pain, shortness of breath, vomiting and lightheadedness this week. History of end-stage renal disease on dialysis. EXAM: CHEST - 2 VIEW COMPARISON:  Chest radiograph September 01, 2017  FINDINGS: Cardiomediastinal silhouette is normal. Low inspiratory examination. No pleural effusions or focal consolidations. Trachea projects midline and there is no pneumothorax. Soft tissue planes and included osseous structures are non-suspicious. IMPRESSION: Negative. Electronically Signed   By: Elon Alas M.D.   On: 10/27/2017 23:49    Procedures Procedures (including critical care time)  Medications Ordered in ED Medications - No data to display   Initial Impression / Assessment and Plan / ED Course  I have reviewed the triage vital signs and the nursing notes.  Pertinent labs & imaging results that were available during my care of the patient were reviewed by me and considered in my medical decision making (see chart for details).     Patient with history of end-stage renal disease on peritoneal dialysis presents to the emergency department for evaluation for weakness and dizziness.  Dizziness is mainly with getting up and walking.  She also has had significant dyspnea on exertion as well as pain in the left chest when she exerts herself.  This has been ongoing for the last several days.  She reports that she takes binders for her kidney disease, stool is always black but it has "smelled like blood".  Hemoccult was positive today.  Patient was admitted in August for similar presentation and underwent colonoscopy at previous hospitalization that showed congested, erythematous, friable colon mucosa and had 2 polyps removed.  EGD showed congestion and erythema with inflammation at the pylorus.  Patient seems to have a baseline hemoglobin of 8-9.  It was 7.3 today.  She had outpatient blood drawn this past week that showed a hemoglobin of 7.7, has received 3 erythropoietin injections for her anemia, but appears to have dropped even further.  Patient will  require repeat hospitalization for blood transfusion and treatment of symptomatic anemia.  CRITICAL CARE Performed by: Orpah Greek   Total critical care time: 30 minutes  Critical care time was exclusive of separately billable procedures and treating other patients.  Critical care was necessary to treat or prevent imminent or life-threatening deterioration.  Critical care was time spent personally by me on the following activities: development of treatment plan with patient and/or surrogate as well as nursing, discussions with consultants, evaluation of patient's response to treatment, examination of patient, obtaining history from patient or surrogate, ordering and performing treatments and interventions, ordering and review of laboratory studies, ordering and review of radiographic studies, pulse oximetry and re-evaluation of patient's condition.   Final Clinical Impressions(s) / ED Diagnoses   Final diagnoses:  Symptomatic anemia    ED Discharge Orders    None       Waylon Koffler, Gwenyth Allegra, MD 10/28/17 [phone]

## 2017-10-28 NOTE — Progress Notes (Signed)
Notified pt's [place] to make NPO after midnight for capsule endoscopy tomorrow.

## 2017-10-28 NOTE — Progress Notes (Addendum)
PROGRESS NOTE    Patricia [name]  LNL:[id number] DOB: Dec 01, 1975 DOA: 10/27/2017 PCP: Blair Heys, PA-C   Brief Narrative: Patient is a 42 year old, African American female with a history of ERSD on peritoneal dialysis, hypertension, anxiety disorder and chronic anemia related to ESRD presented with acute blood loss anemia in setting of GI bleeding. She was admitted from 8/28 to 8/31 with anemia/GI bleed-and underwent EGD/Colonoscopy that did not show any major foci of bleeding. See below for further details.   Subjective: Continues to have black stools this morning.   Assessment & Plan: Acute blood loss anemia secondary GI bleeding:Etiology of GI bleeding unknown-recent EGD/Colonoscopy was neg for bleeding foci. Hb only at 6.9 after 1 unit of PRBC transfusion, continues to have black stools as well-therefore will go ahead and transfuse 2nd unit today. GI consulted-plans for capsule endoscopy.Follow CBC and transfuse accordingly  ESRD: Renal consulted to peritoneal Dialysis. Defer further to nephrology.   Hypertension:At goal. Continue lisinopril and lopressor.  Anxiety:appears stable-Continue Venlafaxine and PRN Xanax.  DVT prophylaxis:SCDs Code Status: Full Family Communication: None  Disposition Plan: May be discharged once cleared by GI and her blood counts normalize and anemia symptoms resolve.   Consultants: GI to be consulted.  Procedures:None  Objective: Vitals:   10/28/17 0347 10/28/17 0418 10/28/17 0433 10/28/17 0641  BP: 139/71 133/64 134/82 125/81  Pulse: 68 74 78 92  Resp: $Remo'16 18 18 17  'TtObq$ Temp: 97.7 F (36.5 C) (!) 97.5 F (36.4 C) 98.2 F (36.8 C) 98.8 F (37.1 C)  TempSrc: Oral Oral Oral Oral  SpO2: 98% 100% 100% 99%  Weight:      Height:        Intake/Output Summary (Last 24 hours) at 10/28/2017 0847 Last data filed at 10/28/2017 4081 Gross per 24 hour  Intake 678 ml  Output -  Net 678 ml   Filed Weights   10/28/17 0236  Weight: 109.8 kg     Examination:  General exam: Appears calm and comfortable  Eyes: Scleral icterus noted, PERRLA Respiratory system: Clear to auscultation. Respiratory effort normal. Cardiovascular system: S1 & S2 heard, RRR. No JVD, murmurs, rubs, gallops or clicks. No pedal edema. Gastrointestinal system: soft non tender/not distended Central nervous system: Alert and oriented. No focal neurological deficits. Extremities: Symmetric 5 x 5 power. Skin: No rashes, lesions or ulcers seen Psychiatry: Judgement and insight appear normal. Mood & affect appropriate.   Data Reviewed: I have personally reviewed following labs and imaging studies  CBC: Recent Labs  Lab 10/27/17 2309  WBC 9.3  HGB 7.3*  HCT 23.2*  MCV 96.7  PLT 448   Basic Metabolic Panel: Recent Labs  Lab 10/27/17 2309  NA 135  K 3.7  CL 97*  CO2 23  GLUCOSE 104*  BUN 49*  CREATININE 13.75*  CALCIUM 8.1*   GFR: Estimated Creatinine Clearance: 6.7 mL/min (A) (by C-G formula based on SCr of 13.75 mg/dL (H)). Liver Function Tests: No results for input(s): AST, ALT, ALKPHOS, BILITOT, PROT, ALBUMIN in the last 168 hours. No results for input(s): LIPASE, AMYLASE in the last 168 hours. No results for input(s): AMMONIA in the last 168 hours. Coagulation Profile: No results for input(s): INR, PROTIME in the last 168 hours. Cardiac Enzymes: No results for input(s): CKTOTAL, CKMB, CKMBINDEX, TROPONINI in the last 168 hours. BNP (last 3 results) No results for input(s): PROBNP in the last 8760 hours. HbA1C: No results for input(s): HGBA1C in the last 72 hours. CBG: No results for input(s): GLUCAP in  the last 168 hours. Lipid Profile: No results for input(s): CHOL, HDL, LDLCALC, TRIG, CHOLHDL, LDLDIRECT in the last 72 hours. Thyroid Function Tests: No results for input(s): TSH, T4TOTAL, FREET4, T3FREE, THYROIDAB in the last 72 hours. Anemia Panel: No results for input(s): VITAMINB12, FOLATE, FERRITIN, TIBC, IRON, RETICCTPCT in  the last 72 hours. Urine analysis:    Component Value Date/Time   COLORURINE YELLOW 04/03/2016 1626   APPEARANCEUR HAZY (A) 04/03/2016 1626   LABSPEC 1.012 04/03/2016 1626   PHURINE 6.0 04/03/2016 1626   GLUCOSEU 150 (A) 04/03/2016 1626   HGBUR NEGATIVE 04/03/2016 1626   BILIRUBINUR NEGATIVE 04/03/2016 1626   KETONESUR NEGATIVE 04/03/2016 1626   PROTEINUR >=300 (A) 04/03/2016 1626   UROBILINOGEN 0.2 09/19/2014 1620   NITRITE NEGATIVE 04/03/2016 1626   LEUKOCYTESUR NEGATIVE 04/03/2016 1626   Sepsis Labs: $RemoveBefo'@[id number]$ (procalcitonin:4,lacticidven:4)  )No results found for this or any previous visit (from the past 240 hour(s)).       Radiology Studies: Dg Chest 2 View  Result Date: 10/27/2017 CLINICAL DATA:  Central chest pain, shortness of breath, vomiting and lightheadedness this week. History of end-stage renal disease on dialysis. EXAM: CHEST - 2 VIEW COMPARISON:  Chest radiograph September 01, 2017 FINDINGS: Cardiomediastinal silhouette is normal. Low inspiratory examination. No pleural effusions or focal consolidations. Trachea projects midline and there is no pneumothorax. Soft tissue planes and included osseous structures are non-suspicious. IMPRESSION: Negative. Electronically Signed   By: Elon Alas M.D.   On: 10/27/2017 23:49     Scheduled Meds: . atorvastatin  40 mg Oral q1800  . calcitRIOL  0.5 mcg Oral QHS  . cinacalcet  30 mg Oral Q breakfast  . lisinopril  10 mg Oral QHS  . metoprolol tartrate  25 mg Oral QPM  . multivitamin  1 tablet Oral QHS  . pantoprazole  40 mg Oral Daily  . sodium chloride flush  3 mL Intravenous Q12H  . sodium chloride flush  3 mL Intravenous Q12H  . sucroferric oxyhydroxide  1,000 mg Oral TID WC  . venlafaxine XR  75 mg Oral QHS   Continuous Infusions: . sodium chloride       LOS: 0 days    Time spent: 25 min  S Ghimire MD  [facility]  If 7PM-7AM, please contact night-coverage [email or web address] Password  [facility] 10/28/2017, 8:47 AM

## 2017-10-29 ENCOUNTER — Encounter (HOSPITAL_COMMUNITY)
Admission: EM | Disposition: A | Discharge status: Home / Self Care | Payer: Self-pay | Source: Home / Self Care | Attending: Internal Medicine

## 2017-10-29 DIAGNOSIS — N2581 Secondary hyperparathyroidism of renal origin: Secondary | ICD-10-CM | POA: Diagnosis present

## 2017-10-29 DIAGNOSIS — K922 Gastrointestinal hemorrhage, unspecified: Secondary | ICD-10-CM | POA: Diagnosis not present

## 2017-10-29 DIAGNOSIS — I151 Hypertension secondary to other renal disorders: Secondary | ICD-10-CM | POA: Diagnosis not present

## 2017-10-29 DIAGNOSIS — F419 Anxiety disorder, unspecified: Secondary | ICD-10-CM | POA: Diagnosis present

## 2017-10-29 DIAGNOSIS — I120 Hypertensive chronic kidney disease with stage 5 chronic kidney disease or end stage renal disease: Secondary | ICD-10-CM | POA: Diagnosis present

## 2017-10-29 DIAGNOSIS — Z79899 Other long term (current) drug therapy: Secondary | ICD-10-CM | POA: Diagnosis not present

## 2017-10-29 DIAGNOSIS — K2970 Gastritis, unspecified, without bleeding: Secondary | ICD-10-CM | POA: Diagnosis present

## 2017-10-29 DIAGNOSIS — Z992 Dependence on renal dialysis: Secondary | ICD-10-CM | POA: Diagnosis not present

## 2017-10-29 DIAGNOSIS — Z886 Allergy status to analgesic agent status: Secondary | ICD-10-CM | POA: Diagnosis not present

## 2017-10-29 DIAGNOSIS — E7800 Pure hypercholesterolemia, unspecified: Secondary | ICD-10-CM | POA: Diagnosis present

## 2017-10-29 DIAGNOSIS — D62 Acute posthemorrhagic anemia: Secondary | ICD-10-CM | POA: Diagnosis present

## 2017-10-29 DIAGNOSIS — K2980 Duodenitis without bleeding: Secondary | ICD-10-CM | POA: Diagnosis present

## 2017-10-29 DIAGNOSIS — N186 End stage renal disease: Secondary | ICD-10-CM | POA: Diagnosis present

## 2017-10-29 DIAGNOSIS — D631 Anemia in chronic kidney disease: Secondary | ICD-10-CM | POA: Diagnosis present

## 2017-10-29 DIAGNOSIS — Z888 Allergy status to other drugs, medicaments and biological substances status: Secondary | ICD-10-CM | POA: Diagnosis not present

## 2017-10-29 DIAGNOSIS — D649 Anemia, unspecified: Secondary | ICD-10-CM | POA: Diagnosis not present

## 2017-10-29 DIAGNOSIS — Z7982 Long term (current) use of aspirin: Secondary | ICD-10-CM | POA: Diagnosis not present

## 2017-10-29 DIAGNOSIS — K219 Gastro-esophageal reflux disease without esophagitis: Secondary | ICD-10-CM | POA: Diagnosis present

## 2017-10-29 HISTORY — PX: GIVENS CAPSULE STUDY: SHX5432

## 2017-10-29 LAB — TYPE AND SCREEN
ABO/RH(D): O NEG
ANTIBODY SCREEN: NEGATIVE
Unit division: 0
Unit division: 0

## 2017-10-29 LAB — BPAM RBC
Blood Product Expiration Date: 201911042359
Blood Product Expiration Date: 201911162359
ISSUE DATE / TIME: 201910240411
ISSUE DATE / TIME: 201910241258
UNIT TYPE AND RH: 9500
Unit Type and Rh: 9500

## 2017-10-29 LAB — RENAL FUNCTION PANEL
Albumin: 2.9 g/dL — ABNORMAL LOW (ref 3.5–5.0)
Anion gap: 18 — ABNORMAL HIGH (ref 5–15)
BUN: 56 mg/dL — ABNORMAL HIGH (ref 6–20)
CHLORIDE: 96 mmol/L — AB (ref 98–111)
CO2: 21 mmol/L — AB (ref 22–32)
Calcium: 7.8 mg/dL — ABNORMAL LOW (ref 8.9–10.3)
Creatinine, Ser: 14.81 mg/dL — ABNORMAL HIGH (ref 0.44–1.00)
GFR calc non Af Amer: 3 mL/min — ABNORMAL LOW (ref >60)
GFR, EST AFRICAN AMERICAN: 3 mL/min — AB (ref >60)
Glucose, Bld: 99 mg/dL (ref 70–99)
Phosphorus: 5.8 mg/dL — ABNORMAL HIGH (ref 2.5–4.6)
Potassium: 3.5 mmol/L (ref 3.5–5.1)
Sodium: 135 mmol/L (ref 135–145)

## 2017-10-29 LAB — CBC
HCT: 25.7 % — ABNORMAL LOW (ref 36.0–46.0)
Hemoglobin: 8.2 g/dL — ABNORMAL LOW (ref 12.0–15.0)
MCH: 28.6 pg (ref 26.0–34.0)
MCHC: 31.9 g/dL (ref 30.0–36.0)
MCV: 89.5 fL (ref 80.0–100.0)
Platelets: 236 10*3/uL (ref 150–400)
RBC: 2.87 MIL/uL — ABNORMAL LOW (ref 3.87–5.11)
RDW: 16.6 % — ABNORMAL HIGH (ref 11.5–15.5)
WBC: 6 10*3/uL (ref 4.0–10.5)
nRBC: 0.8 % — ABNORMAL HIGH (ref 0.0–0.2)

## 2017-10-29 LAB — GLUCOSE, CAPILLARY: Glucose-Capillary: 101 mg/dL — ABNORMAL HIGH (ref 70–99)

## 2017-10-29 SURGERY — IMAGING PROCEDURE, GI TRACT, INTRALUMINAL, VIA CAPSULE
Anesthesia: LOCAL

## 2017-10-29 SURGICAL SUPPLY — 1 items: TOWEL COTTON PACK 4EA (MISCELLANEOUS) ×4 IMPLANT

## 2017-10-29 NOTE — Progress Notes (Addendum)
[facility] Progress Note   Subjective:   Seen and examined at bedside.  Feeling a little better. Reports 2 dark loose stool last night.  Plan for capsule study today.    Objective Vitals:   10/28/17 1858 10/28/17 2121 10/29/17 0609 10/29/17 1011  BP: (!) 144/86 139/86 117/68 111/69  Pulse: 82 61 65 66  Resp: $Remo'18 18 18 18  'XABsZ$ Temp: 98.7 F (37.1 C) 97.7 F (36.5 C) 98.4 F (36.9 C) 98.2 F (36.8 C)  TempSrc: Oral Oral Oral Oral  SpO2: 100% 100% 100% 99%  Weight: 116.5 kg     Height:       Physical Exam General:NAD, pleasant obese female Heart:RRR, no mrg Lungs: CTAB, nml WOB Abdomen:soft, NTND, PD cath dressed Extremities:no edema Dialysis Access: PD cath   [facility] Weights   10/28/17 0236 10/28/17 1858  Weight: 109.8 kg 116.5 kg    Intake/Output Summary (Last 24 hours) at 10/29/2017 1134 Last data filed at 10/29/2017 1012 Gross per 24 hour  Intake 15792 ml  Output 16200 ml  Net -408 ml    Additional Objective Labs: Basic Metabolic Panel: Recent Labs  Lab 10/27/17 2309 10/29/17 0513  NA 135 135  K 3.7 3.5  CL 97* 96*  CO2 23 21*  GLUCOSE 104* 99  BUN 49* 56*  CREATININE 13.75* 14.81*  CALCIUM 8.1* 7.8*  PHOS  --  5.8*   Liver Function Tests: Recent Labs  Lab 10/29/17 0513  ALBUMIN 2.9*   CBC: Recent Labs  Lab 10/27/17 2309 10/28/17 0943 10/28/17 2002 10/29/17 0513  WBC 9.3  --   --  6.0  HGB 7.3* 6.9* 8.7* 8.2*  HCT 23.2* 22.5* 27.9* 25.7*  MCV 96.7  --   --  89.5  PLT 332  --   --  236    CBG: Recent Labs  Lab 10/29/17 0746  GLUCAP 101*   Studies/Results: Dg Chest 2 View  Result Date: 10/27/2017 CLINICAL DATA:  Central chest pain, shortness of breath, vomiting and lightheadedness this week. History of end-stage renal disease on dialysis. EXAM: CHEST - 2 VIEW COMPARISON:  Chest radiograph September 01, 2017 FINDINGS: Cardiomediastinal silhouette is normal. Low inspiratory examination. No pleural effusions or focal  consolidations. Trachea projects midline and there is no pneumothorax. Soft tissue planes and included osseous structures are non-suspicious. IMPRESSION: Negative. Electronically Signed   By: Elon Alas M.D.   On: 10/27/2017 23:49    Medications: . sodium chloride    . dialysis solution 2.5% low-MG/low-CA     . atorvastatin  40 mg Oral q1800  . calcitRIOL  0.5 mcg Oral QHS  . cinacalcet  30 mg Oral Q breakfast  . gentamicin cream  1 application Topical Daily  . lisinopril  10 mg Oral QHS  . metoprolol tartrate  25 mg Oral QPM  . multivitamin  1 tablet Oral QHS  . pantoprazole  40 mg Oral Daily  . sodium chloride flush  3 mL Intravenous Q12H  . sodium chloride flush  3 mL Intravenous Q12H  . sucroferric oxyhydroxide  1,000 mg Oral TID WC  . venlafaxine XR  75 mg Oral QHS    Dialysis Orders: CCPD, EDW 108.5, uses 2.5 dextrose in all bags, exchanges 5, fill volume 3L, dwell time 2 hr, last FL 3 L, Day exchanges 1, volume 3 L, day dwell time 4 hours. She does day dwell from 5-9 PM  Assessment/Plan: 1. Acute on Chronic Anemia of CKD 2/2 GIB - Hgb 6.9>8.2 s/p  2Unit pRBC.  Upper GI endoscopy & colonoscopy in 08/2017 unremarkable. ?small bowel AVMs - capsule endoscopy planned for today.  2. ESRD - on PD.  Continue regular orders.  K 3.5. No midday exchange today due to procedure.  3. Secondary hyperparathyroidism - CCa 8.6, Phos 5.8. Continue VDRA, sensipar and binders.  4. HTN/volume - Blood pressure well controlled.  On lisinopril and metoprolol.  Appears euvolemic on exam. 1.1L removed with PD, uses 2.5% dextrose.  5. Nutrition - Alb 2.9. Renal diet w/fluid restrictions.  Nepro. Renavite 6. Anxiety - per primary  Jen Mow, PA-C [facility] Pager: [phone] 10/29/2017,11:34 AM  LOS: 0 days   I have seen and examined this patient and agree with plan and assessment in the above note with renal recommendations/intervention highlighted.  Doing well with  CCPD.  Capsule endoscopy was given earlier.  Will cont with PD while she remains an inpatient.  Repeat Hgb pending.  May need another blood transfusion if it continues to drop. Broadus John A Darnice Comrie,MD 10/29/2017 3:50 PM

## 2017-10-29 NOTE — Progress Notes (Signed)
Called HD unit requesting pt to be removed from PD. RN stated "someone would come up". Notified Pt of same.

## 2017-10-29 NOTE — Progress Notes (Signed)
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 She was admitted from 8/28 to 8/31 with anemia/GI bleed-and underwent EGD/Colonoscopy that did not show any major foci of bleeding.  She was seen by both GI and nephrology, GI plans on performing a small bowel capsule endoscopy on 10/25.  See below for further details.   Subjective: Had 2-3 melanotic looking stools overnight.  Assessment & Plan: Acute blood loss anemia secondary GI bleeding: Etiology of GI bleeding remains unknown-but suspicion for AVMs.  Recent EGD/colonoscopy in August did not show any obvious bleeding foci, GI following-with plans for capsule endoscopy today.  Has received 2 units of PRBC so far-hemoglobin stable this morning-we will repeat hemoglobin later this afternoon and follow closely.  GI following.  Will await further recommendations from GI.   ESRD: Nephrology following-on peritoneal dialysis  Hypertension: Controlled, continue lisinopril and Lopressor.    Dyslipidemia: Continue statin.  GERD: Continue PPI  Anxiety: Stable-continue as needed Xanax and venlafaxine.    DVT prophylaxis:SCDs Code Status: Full Family Communication: None  Disposition Plan: Home once a GI bleeding resolves. Consultants: GI, renal  Procedures:None  Objective: Vitals:   10/28/17 2121 10/29/17 0609 10/29/17 1011 10/29/17 1214  BP: 139/86 117/68 111/69   Pulse: 61 65 66   Resp: $Remo'18 18 18   'lAcRk$ Temp: 97.7 F (36.5 C) 98.4 F (36.9 C) 98.2 F (36.8 C)   TempSrc: Oral Oral Oral   SpO2: 100% 100% 99%   Weight:    116.5 kg  Height:    '5\' 7"'$  (1.702 m)    Intake/Output Summary (Last 24 hours) at 10/29/2017 1326 Last data filed at  10/29/2017 1012 Gross per 24 hour  Intake 15792 ml  Output 16200 ml  Net -408 ml   Filed Weights   10/28/17 0236 10/28/17 1858 10/29/17 1214  Weight: 109.8 kg 116.5 kg 116.5 kg    Examination: General appearance:Awake, alert, not in any distress.  Eyes:no scleral icterus. HEENT: Atraumatic and Normocephalic Neck: supple, no JVD. Resp:Good air entry bilaterally,no rales or rhonchi CVS: S1 S2 regular, no murmurs.  GI: Bowel sounds present, Non tender and not distended with no gaurding, rigidity or rebound. Extremities: B/L Lower Ext shows no edema, both legs are warm to touch Neurology:  Non focal Psychiatric: Normal judgment and insight. Normal mood. Musculoskeletal:No digital cyanosis Skin:No Rash, warm and dry Wounds:N/A  Data Reviewed: I have personally reviewed following labs and imaging studies  CBC: Recent Labs  Lab 10/27/17 2309 10/28/17 0943 10/28/17 2002 10/29/17 0513  WBC 9.3  --   --  6.0  HGB 7.3* 6.9* 8.7* 8.2*  HCT 23.2* 22.5* 27.9* 25.7*  MCV 96.7  --   --  89.5  PLT 332  --   --  659   Basic Metabolic Panel: Recent Labs  Lab 10/27/17 2309 10/29/17 0513  NA 135 135  K 3.7 3.5  CL 97* 96*  CO2 23 21*  GLUCOSE 104* 99  BUN 49* 56*  CREATININE 13.75* 14.81*  CALCIUM 8.1* 7.8*  PHOS  --  5.8*   GFR: Estimated Creatinine Clearance: 6.4 mL/min (A) (by C-G formula based on SCr of 14.81 mg/dL (H)). Liver Function Tests: Recent Labs  Lab 10/29/17 [phone]  ALBUMIN 2.9*   No results for input(s): LIPASE, AMYLASE in the last 168 hours. No results for input(s): AMMONIA in the last 168 hours. Coagulation Profile: No results for input(s): INR, PROTIME in the last 168 hours. Cardiac Enzymes: No results for input(s): CKTOTAL, CKMB, CKMBINDEX, TROPONINI in the last 168 hours. BNP (last 3 results) No results for input(s): PROBNP in the last 8760 hours. HbA1C: No results for input(s): HGBA1C in the last 72 hours. CBG: Recent Labs  Lab 10/29/17 0746    GLUCAP 101*   Lipid Profile: No results for input(s): CHOL, HDL, LDLCALC, TRIG, CHOLHDL, LDLDIRECT in the last 72 hours. Thyroid Function Tests: No results for input(s): TSH, T4TOTAL, FREET4, T3FREE, THYROIDAB in the last 72 hours. Anemia Panel: No results for input(s): VITAMINB12, FOLATE, FERRITIN, TIBC, IRON, RETICCTPCT in the last 72 hours. Urine analysis:    Component Value Date/Time   COLORURINE YELLOW 04/03/2016 1626   APPEARANCEUR HAZY (A) 04/03/2016 1626   LABSPEC 1.012 04/03/2016 1626   PHURINE 6.0 04/03/2016 1626   GLUCOSEU 150 (A) 04/03/2016 1626   HGBUR NEGATIVE 04/03/2016 1626   BILIRUBINUR NEGATIVE 04/03/2016 1626   KETONESUR NEGATIVE 04/03/2016 1626   PROTEINUR >=300 (A) 04/03/2016 1626   UROBILINOGEN 0.2 09/19/2014 1620   NITRITE NEGATIVE 04/03/2016 1626   LEUKOCYTESUR NEGATIVE 04/03/2016 1626   Sepsis Labs: $RemoveBefo'@[email or web address]'bddPrzJrelK$ (procalcitonin:4,lacticidven:4)  )No results found for this or any previous visit (from the past 240 hour(s)).       Radiology Studies: Dg Chest 2 View  Result Date: 10/27/2017 CLINICAL DATA:  Central chest pain, shortness of breath, vomiting and lightheadedness this week. History of end-stage renal disease on dialysis. EXAM: CHEST - 2 VIEW COMPARISON:  Chest radiograph September 01, 2017 FINDINGS: Cardiomediastinal silhouette is normal. Low inspiratory examination. No pleural effusions or focal consolidations. Trachea projects midline and there is no pneumothorax. Soft tissue planes and included osseous structures are non-suspicious. IMPRESSION: Negative. Electronically Signed   By: Elon Alas M.D.   On: 10/27/2017 23:49     Scheduled Meds: . atorvastatin  40 mg Oral q1800  . calcitRIOL  0.5 mcg Oral QHS  . cinacalcet  30 mg Oral Q breakfast  . gentamicin cream  1 application Topical Daily  . lisinopril  10 mg Oral QHS  . metoprolol tartrate  25 mg Oral QPM  . multivitamin  1 tablet Oral QHS  . pantoprazole  40 mg Oral Daily  .  sodium chloride flush  3 mL Intravenous Q12H  . sodium chloride flush  3 mL Intravenous Q12H  . sucroferric oxyhydroxide  1,000 mg Oral TID WC  . venlafaxine XR  75 mg Oral QHS   Continuous Infusions: . sodium chloride    . dialysis solution 2.5% low-MG/low-CA       LOS: 0 days    Time spent: 25 min  Nena Alexander MD  [facility]  If 7PM-7AM, please contact night-coverage [email or web address] Password [email or web address] 10/29/2017, 1:26 PM

## 2017-10-29 NOTE — Progress Notes (Signed)
Called HD unit a 2nd time requesting pt to be removed from PD. Pt notified of request made a second time. Spoke to RN and she stated she would send "someone up".

## 2017-10-30 LAB — CBC
HEMATOCRIT: 39.7 % (ref 36.0–46.0)
HEMATOCRIT: 27.5 % — AB (ref 36.0–46.0)
HEMATOCRIT: 29.8 % — AB (ref 36.0–46.0)
Hemoglobin: 12.5 g/dL (ref 12.0–15.0)
Hemoglobin: 9.1 g/dL — ABNORMAL LOW (ref 12.0–15.0)
Hemoglobin: 9.5 g/dL — ABNORMAL LOW (ref 12.0–15.0)
MCH: 28 pg (ref 26.0–34.0)
MCH: 29.6 pg (ref 26.0–34.0)
MCH: 29 pg (ref 26.0–34.0)
MCHC: 31.5 g/dL (ref 30.0–36.0)
MCHC: 33.1 g/dL (ref 30.0–36.0)
MCHC: 31.9 g/dL (ref 30.0–36.0)
MCV: 88.8 fL (ref 80.0–100.0)
MCV: 89.6 fL (ref 80.0–100.0)
MCV: 90.9 fL (ref 80.0–100.0)
NRBC: 0.6 % — AB (ref 0.0–0.2)
NRBC: 0.3 % — AB (ref 0.0–0.2)
PLATELETS: 271 10*3/uL (ref 150–400)
Platelets: 156 10*3/uL (ref 150–400)
Platelets: 269 10*3/uL (ref 150–400)
RBC: 4.47 MIL/uL (ref 3.87–5.11)
RBC: 3.07 MIL/uL — ABNORMAL LOW (ref 3.87–5.11)
RBC: 3.28 MIL/uL — AB (ref 3.87–5.11)
RDW: 17.1 % — AB (ref 11.5–15.5)
RDW: 16.5 % — ABNORMAL HIGH (ref 11.5–15.5)
RDW: 16.7 % — ABNORMAL HIGH (ref 11.5–15.5)
WBC: 5 10*3/uL (ref 4.0–10.5)
WBC: 6.7 10*3/uL (ref 4.0–10.5)
WBC: 6.3 10*3/uL (ref 4.0–10.5)
nRBC: 0.8 % — ABNORMAL HIGH (ref 0.0–0.2)

## 2017-10-30 LAB — RENAL FUNCTION PANEL
Albumin: 3.2 g/dL — ABNORMAL LOW (ref 3.5–5.0)
Anion gap: 17 — ABNORMAL HIGH (ref 5–15)
BUN: 53 mg/dL — ABNORMAL HIGH (ref 6–20)
CHLORIDE: 98 mmol/L (ref 98–111)
CO2: 22 mmol/L (ref 22–32)
Calcium: 8.5 mg/dL — ABNORMAL LOW (ref 8.9–10.3)
Creatinine, Ser: 14.53 mg/dL — ABNORMAL HIGH (ref 0.44–1.00)
GFR, EST AFRICAN AMERICAN: 3 mL/min — AB (ref >60)
GFR, EST NON AFRICAN AMERICAN: 3 mL/min — AB (ref >60)
Glucose, Bld: 112 mg/dL — ABNORMAL HIGH (ref 70–99)
POTASSIUM: 3.5 mmol/L (ref 3.5–5.1)
Phosphorus: 6 mg/dL — ABNORMAL HIGH (ref 2.5–4.6)
Sodium: 137 mmol/L (ref 135–145)

## 2017-10-30 LAB — GLUCOSE, CAPILLARY: GLUCOSE-CAPILLARY: 101 mg/dL — AB (ref 70–99)

## 2017-10-30 LAB — HEPATITIS B SURFACE ANTIBODY,QUALITATIVE: HEP B S AB: REACTIVE

## 2017-10-30 LAB — HEPATITIS B SURFACE ANTIGEN: Hepatitis B Surface Ag: NEGATIVE

## 2017-10-30 MED ORDER — PROMETHAZINE HCL 25 MG/ML IJ SOLN
12.5000 mg | Freq: Four times a day (QID) | INTRAMUSCULAR | Status: DC | PRN
  Administered 2017-10-30: 12.5 mg via INTRAVENOUS
  Filled 2017-10-30: qty 1

## 2017-10-30 MED ORDER — PROMETHAZINE HCL 25 MG/ML IJ SOLN
12.5000 mg | Freq: Once | INTRAMUSCULAR | Status: AC
  Administered 2017-10-30: 12.5 mg via INTRAVENOUS
  Filled 2017-10-30: qty 1

## 2017-10-30 NOTE — Progress Notes (Addendum)
[facility] Progress Note   Subjective:   Seen and examined at bedside.  Complains of nausea, recently given Zofran with no improvement noted yet.  Reports 2 large dark/black stool overnight with abdominal cramping.  PD went well without complications.  Objective Vitals:   10/29/17 1214 10/29/17 1359 10/29/17 1727 10/30/17 0546  BP:  124/79 134/73 (!) 96/47  Pulse:  65 85 72  Resp:  16  18  Temp:  97.7 F (36.5 C) 98.8 F (37.1 C) 98.3 F (36.8 C)  TempSrc:  Oral Oral Oral  SpO2:  100%  98%  Weight: 116.5 kg  114.4 kg   Height: $Remove'5\' 7"'wqXMOsP$  (1.702 m)      Physical Exam General:NAD, pleasant obese female Heart:RRR, no mrg Lungs:CTAB, nml WOB Abdomen:soft, PD cath dressed Extremities:no LE edema Dialysis Access: PD cath   [facility] Weights   10/28/17 1858 10/29/17 1214 10/29/17 1727  Weight: 116.5 kg 116.5 kg 114.4 kg    Intake/Output Summary (Last 24 hours) at 10/30/2017 1109 Last data filed at 10/30/2017 0654 Gross per 24 hour  Intake -  Output 1 ml  Net -1 ml    Additional Objective Labs: Basic Metabolic Panel: Recent Labs  Lab 10/27/17 2309 10/29/17 0513 10/30/17 0438  NA 135 135 137  K 3.7 3.5 3.5  CL 97* 96* 98  CO2 23 21* 22  GLUCOSE 104* 99 112*  BUN 49* 56* 53*  CREATININE 13.75* 14.81* 14.53*  CALCIUM 8.1* 7.8* 8.5*  PHOS  --  5.8* 6.0*   Liver Function Tests: Recent Labs  Lab 10/29/17 0513 10/30/17 0438  ALBUMIN 2.9* 3.2*   CBC: Recent Labs  Lab 10/27/17 2309  10/29/17 0513 10/29/17 1505 10/29/17 2239 10/30/17 0438  WBC 9.3  --  6.0 5.0 6.7 6.3  HGB 7.3*   < > 8.2* 12.5 9.1* 9.5*  HCT 23.2*   < > 25.7* 39.7 27.5* 29.8*  MCV 96.7  --  89.5 88.8 89.6 90.9  PLT 332  --  236 156 271 269   < > = values in this interval not displayed.   CBG: Recent Labs  Lab 10/29/17 0746 10/30/17 0813  GLUCAP 101* 101*   Studies/Results: No results found.  Medications: . sodium chloride    . dialysis solution 2.5% low-MG/low-CA      . atorvastatin  40 mg Oral q1800  . calcitRIOL  0.5 mcg Oral QHS  . cinacalcet  30 mg Oral Q breakfast  . gentamicin cream  1 application Topical Daily  . lisinopril  10 mg Oral QHS  . metoprolol tartrate  25 mg Oral QPM  . multivitamin  1 tablet Oral QHS  . pantoprazole  40 mg Oral Daily  . sodium chloride flush  3 mL Intravenous Q12H  . sodium chloride flush  3 mL Intravenous Q12H  . sucroferric oxyhydroxide  1,000 mg Oral TID WC  . venlafaxine XR  75 mg Oral QHS    Dialysis Orders: CCPD, EDW 108.5, uses 2.5 dextrose in all bags, exchanges 5, fill volume 3L, dwell time 2 hr, last FL 3 L, Day exchanges 1, volume 3 L, day dwell time 4 hours. She does day dwell from 5-9 PM  Assessment/Plan: 1. Acute on Chronic Anemia of CKD 2/2 GIB - Hgb 6.9>8.2>12.2>9.5 s/p 2Unit pRBC on 10/24. Upper GI endoscopy & colonoscopy in 08/2017 unremarkable. ?small bowel AVMs - capsule endoscopy pending. 2. ESRD - on PD.  Continue regular orders.  K 3.5.  3. Secondary hyperparathyroidism -  CCa 9.1, Phos 6.0. Continue VDRA, sensipar and binders.  4. HTN/volume - Blood pressure soft this AM. On lisinopril and metoprolol.  Appears euvolemic on exam although not to EDW. No post weight noted today. uses 2.5% dextrose.  5. Nutrition - Alb 3.2. Renal diet w/fluid restrictions.  Nepro. Renavite 6. Anxiety - per primary   Jen Mow, PA-C [facility] Pager: [phone] 10/30/2017,11:09 AM  LOS: 1 day   I have seen and examined this patient and agree with plan and assessment in the above note with renal recommendations/intervention highlighted.  The Hgb of 12.2 was likely spurious given 2 units of PRBC on 10/24 and 1 on 10/23.  She passed the capsule and awaiting review.  Continue with CCPD for now.  Discharge per primary svc.  Hold potassium supplements in case she needs another blood transfusion. Broadus John A Maydell Knoebel,MD 10/30/2017 1:09 PM

## 2017-10-30 NOTE — Progress Notes (Signed)
Patient complained of nausea and received Zofran 4 mg IV PRN which was ineffective according to her. Bodenheimer Np notified and received Phenergan 12. 5 mg x one dose. Will give and continue to monitor.

## 2017-10-30 NOTE — Progress Notes (Signed)
Patient ID: Patricia [name], female   DOB: [birth year], 42 y.o.   MRN: [id number]  Capsule endoscopy showed mild gastritis and duodenitis and a non-specific focal area of nodular small bowel mucosa. No source of anemia seen. [facility] consult needed but anemia likely multi-factorial in setting of ESRD. See procedure report for complete findings to be scanned in by medical records.

## 2017-10-30 NOTE — Progress Notes (Signed)
[facility] Progress Note  Patricia [name] 42 y.o. 11/19/1975   Subjective: Complaining of abdominal pain this evening on peritoneal dialysis right now.  Objective: Vital signs: Vitals:   10/30/17 1332 10/30/17 1752  BP: 121/73 116/71  Pulse: 71 67  Resp: 20 16  Temp: 98.5 F (36.9 C) 98.1 F (36.7 C)  SpO2: 98%     Physical Exam: Gen: alert, no acute distress, obese HEENT: anicteric sclera CV: RRR Chest: CTA B Abd: minimal diffuse tenderness with guarding, soft, nondistended, +BS, obese   Lab Results: Recent Labs    10/29/17 0513 10/30/17 0438  NA 135 137  K 3.5 3.5  CL 96* 98  CO2 21* 22  GLUCOSE 99 112*  BUN 56* 53*  CREATININE 14.81* 14.53*  CALCIUM 7.8* 8.5*  PHOS 5.8* 6.0*   Recent Labs    10/29/17 0513 10/30/17 0438  ALBUMIN 2.9* 3.2*   Recent Labs    10/29/17 2239 10/30/17 0438  WBC 6.7 6.3  HGB 9.1* 9.5*  HCT 27.5* 29.8*  MCV 89.6 90.9  PLT 271 269      Assessment/Plan: Obscure GI bleed - capsule endoscopy showed mild gastritis and duodenitis and no source for anemia. Recommend outpt hematology consult. Hgb 12 yesterday does not seem accurate with Hgb 8.2 immediately prior and 9.1 last night without any signs of further overt bleeding. Ok to go home tomorrow if Hgb stable. Will sign off. Call if questions.   [name] 10/30/2017, 6:55 PM  Questions please call [phone] ID: Patricia [name], female   DOB: [birth year], 41 y.o.   MRN: [id number]

## 2017-10-30 NOTE — Progress Notes (Signed)
Peritoneal dialysis completed  without any issues. Patient refused to do weight at this time, stated  the fluid is not completed out. Will pass it on for day shift to do the weight. Hbg at this time is 9.5  Patient had a dark stool and she indicated she wiped bright red blood at some point which this RN didn't  observe.

## 2017-10-30 NOTE — Progress Notes (Signed)
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 She was admitted from 8/28 to 8/31 with anemia/GI bleed-and underwent EGD/Colonoscopy that did not show any major foci of bleeding.  She was seen by both GI and nephrology, GI plans on performing a small bowel capsule endoscopy on 10/25.  See below for further details.   Subjective:  Patient in bed, appears comfortable, denies any headache, no fever, no chest pain or pressure, no shortness of breath , no abdominal pain. No focal weakness.  Assessment & Plan:  Acute blood loss anemia secondary GI bleeding: Likely upper GI bleed but source unclear, in August he had an unrevealing EGD/colonoscopy, has received 2 units of packed RBC this admission so far, currently H&H stable and will be monitored.  GI following underwent capsule endoscopy on 10/29/2017 results are pending.  Continue supportive care till then.  ESRD: Nephrology following-on peritoneal dialysis, continue.  Hypertension: Controlled, continue lisinopril and Lopressor.    Dyslipidemia: Continue statin.  GERD: Continue PPI  Anxiety: Stable-continue as needed Xanax and venlafaxine.     DVT prophylaxis:SCDs Code Status: Full Family Communication: None  Disposition Plan: Home once a GI bleeding resolves. Consultants: GI, Renal  Procedures:None  Objective: Vitals:   10/29/17 1214 10/29/17 1359 10/29/17 1727 10/30/17 0546  BP:  124/79 134/73 (!) 96/47  Pulse:  65 85 72  Resp:  16  18  Temp:  97.7 F (36.5 C) 98.8 F (37.1 C) 98.3 F (36.8 C)  TempSrc:  Oral Oral Oral  SpO2:  100%  98%  Weight: 116.5 kg  114.4 kg   Height: $Remove'5\' 7"'RvjWfkc$  (1.702 m)       Intake/Output Summary  (Last 24 hours) at 10/30/2017 1110 Last data filed at 10/30/2017 0654 Gross per 24 hour  Intake -  Output 1 ml  Net -1 ml   Filed Weights   10/28/17 1858 10/29/17 1214 10/29/17 1727  Weight: 116.5 kg 116.5 kg 114.4 kg    Examination:  Awake Alert, Oriented X 3, No new F.N deficits, Normal affect [place].AT,PERRAL Supple Neck,No JVD, No cervical lymphadenopathy appriciated.  Symmetrical Chest wall movement, Good air movement bilaterally, CTAB RRR,No Gallops, Rubs or new Murmurs, No Parasternal Heave +ve B.Sounds, Abd Soft, No tenderness, No organomegaly appriciated, No rebound - guarding or rigidity. No Cyanosis, Clubbing or edema, No new Rash or bruise    Data Reviewed: I have personally reviewed following labs and imaging studies  CBC: Recent Labs  Lab 10/27/17 2309  10/28/17 2002 10/29/17 0513 10/29/17 1505 10/29/17 2239 10/30/17 0438  WBC 9.3  --   --  6.0 5.0 6.7 6.3  HGB 7.3*   < > 8.7* 8.2* 12.5 9.1* 9.5*  HCT 23.2*   < > 27.9* 25.7* 39.7 27.5* 29.8*  MCV 96.7  --   --  89.5 88.8 89.6 90.9  PLT 332  --   --  236 156 271 269   < > = values in this interval not displayed.   Basic Metabolic Panel: Recent Labs  Lab 10/27/17 2309 10/29/17 0513 10/30/17 0438  NA 135 135 137  K 3.7 3.5 3.5  CL 97* 96* 98  CO2 23 21* 22  GLUCOSE 104* 99 112*  BUN 49* 56*  53*  CREATININE 13.75* 14.81* 14.53*  CALCIUM 8.1* 7.8* 8.5*  PHOS  --  5.8* 6.0*   GFR: Estimated Creatinine Clearance: 6.5 mL/min (A) (by C-G formula based on SCr of 14.53 mg/dL (H)). Liver Function Tests: Recent Labs  Lab 10/29/17 0513 10/30/17 0438  ALBUMIN 2.9* 3.2*   No results for input(s): LIPASE, AMYLASE in the last 168 hours. No results for input(s): AMMONIA in the last 168 hours. Coagulation Profile: No results for input(s): INR, PROTIME in the last 168 hours. Cardiac Enzymes: No results for input(s): CKTOTAL, CKMB, CKMBINDEX, TROPONINI in the last 168 hours. BNP (last 3 results) No  results for input(s): PROBNP in the last 8760 hours. HbA1C: No results for input(s): HGBA1C in the last 72 hours. CBG: Recent Labs  Lab 10/29/17 0746 10/30/17 0813  GLUCAP 101* 101*   Lipid Profile: No results for input(s): CHOL, HDL, LDLCALC, TRIG, CHOLHDL, LDLDIRECT in the last 72 hours. Thyroid Function Tests: No results for input(s): TSH, T4TOTAL, FREET4, T3FREE, THYROIDAB in the last 72 hours. Anemia Panel: No results for input(s): VITAMINB12, FOLATE, FERRITIN, TIBC, IRON, RETICCTPCT in the last 72 hours. Urine analysis:    Component Value Date/Time   COLORURINE YELLOW 04/03/2016 1626   APPEARANCEUR HAZY (A) 04/03/2016 1626   LABSPEC 1.012 04/03/2016 1626   PHURINE 6.0 04/03/2016 1626   GLUCOSEU 150 (A) 04/03/2016 1626   HGBUR NEGATIVE 04/03/2016 1626   BILIRUBINUR NEGATIVE 04/03/2016 1626   KETONESUR NEGATIVE 04/03/2016 1626   PROTEINUR >=300 (A) 04/03/2016 1626   UROBILINOGEN 0.2 09/19/2014 1620   NITRITE NEGATIVE 04/03/2016 1626   LEUKOCYTESUR NEGATIVE 04/03/2016 1626   Sepsis Labs: $RemoveBefo'@[email or web address]'[email or web address]$ (procalcitonin:4,lacticidven:4)  )No results found for this or any previous visit (from the past 240 hour(s)).   Radiology Studies: No results found.   Scheduled Meds: . atorvastatin  40 mg Oral q1800  . calcitRIOL  0.5 mcg Oral QHS  . cinacalcet  30 mg Oral Q breakfast  . gentamicin cream  1 application Topical Daily  . lisinopril  10 mg Oral QHS  . metoprolol tartrate  25 mg Oral QPM  . multivitamin  1 tablet Oral QHS  . pantoprazole  40 mg Oral Daily  . sodium chloride flush  3 mL Intravenous Q12H  . sodium chloride flush  3 mL Intravenous Q12H  . sucroferric oxyhydroxide  1,000 mg Oral TID WC  . venlafaxine XR  75 mg Oral QHS   Continuous Infusions: . sodium chloride    . dialysis solution 2.5% low-MG/low-CA       LOS: 1 day   Time spent: 25 min  Signature  Lala Lund M.D on 10/30/2017 at 11:11 AM   -  To page go to [email or web address] - password  [facility]

## 2017-10-31 LAB — GLUCOSE, CAPILLARY: Glucose-Capillary: 92 mg/dL (ref 70–99)

## 2017-10-31 NOTE — Discharge Instructions (Signed)
Follow with Primary MD Blair Heys, PA-C in 7 days   Get CBC, CMP, Iron Panel, B12, Folate, TSH-  checked  by Primary MD in 5-7 days   Activity: As tolerated with Full fall precautions use walker/cane & assistance as needed  Disposition Home    Diet: Renal.  Special Instructions: If you have smoked or chewed Tobacco  in the last 2 yrs please stop smoking, stop any regular Alcohol  and or any Recreational drug use.  On your next visit with your primary care physician please Get Medicines reviewed and adjusted.  Please request your Prim.MD to go over all Hospital Tests and Procedure/Radiological results at the follow up, please get all Hospital records sent to your Prim MD by signing hospital release before you go home.  If you experience worsening of your admission symptoms, develop shortness of breath, life threatening emergency, suicidal or homicidal thoughts you must seek medical attention immediately by calling 911 or calling your MD immediately  if symptoms less severe.  You Must read complete instructions/literature along with all the possible adverse reactions/side effects for all the Medicines you take and that have been prescribed to you. Take any new Medicines after you have completely understood and accpet all the possible adverse reactions/side effects.

## 2017-10-31 NOTE — Discharge Summary (Signed)
Patricia [name] TDS:[id number] DOB: 08-22-1975 DOA: 10/27/2017  PCP: Blair Heys, PA-C  Admit date: 10/27/2017  Discharge date: 10/31/2017  Admitted From: Home   Disposition:  Home   Recommendations for Outpatient Follow-up:   Follow up with PCP in 1-2 weeks  PCP Please obtain BMP/CBC, 2 view CXR in 1week,  (see Discharge instructions)   PCP Please follow up on the following pending results: Follow iron panel, B12, TSH levels.  Arrange for outpatient hematology follow-up.   Home Health: None Equipment/Devices: None  Consultations: Renal, GI Discharge Condition: Fair   CODE STATUS: Full   Diet Recommendation: Renal  Chief Complaint  Patient presents with  . 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 She was admitted from 8/28 to 8/31 with anemia/GI bleed-and underwent EGD/Colonoscopy that did not show any major foci of bleeding.  She was seen by both GI and nephrology, GI plans on performing a small bowel capsule endoscopy on 10/25.  See below for further details.                                                                 Hospital Course    Anemia of chronic disease versus acute blood loss anemia secondary GI bleeding: Likely upper GI bleed but source unclear, in August he had an unrevealing EGD/colonoscopy, capsule endoscopy showing mild nonspecific gastritis and esophagitis but no evidence of acute ongoing bleed, has received 2 units of packed RBC this admission so far, currently H&H stable no signs of ongoing  bleed, GI recommended outpatient hematology follow-up, will be discharged, reason for anemia not entirely clear this could be chronic anemia of chronic disease however will need close monitoring with PCP, nephrology and outpatient hematology.  She is symptom-free and will be discharged home today.  Continue PPI upon discharge.  Request PCP to monitor her iron levels, B12, folate and TSH closely.  ESRD: Nephrology following-on peritoneal dialysis, continue home schedule upon discharge and follow with primary nephrologist as needed.  Hypertension: Controlled, continue lisinopril and Lopressor.    Dyslipidemia: Continue statin.  GERD: Continue PPI  Anxiety: Stable-continue as needed Xanax and venlafaxine.     Discharge diagnosis     Principal Problem:   Symptomatic anemia Active Problems:   HTN (hypertension)   ESRD (end stage renal disease) ([place])   Anxiety    Discharge instructions    Discharge Instructions    Diet - low sodium heart healthy   Complete by:  As directed    Discharge instructions  Complete by:  As directed    Follow with Primary MD Long, Caryl Pina, PA-C in 7 days   Get CBC, CMP, Iron Panel, B12, Folate, TSH-  checked  by Primary MD in 5-7 days   Activity: As tolerated with Full fall precautions use walker/cane & assistance as needed  Disposition Home    Diet: Renal.  Special Instructions: If you have smoked or chewed Tobacco  in the last 2 yrs please stop smoking, stop any regular Alcohol  and or any Recreational drug use.  On your next visit with your primary care physician please Get Medicines reviewed and adjusted.  Please request your Prim.MD to go over all Hospital Tests and Procedure/Radiological results at the follow up, please get all Hospital records sent to your Prim MD by signing hospital release before you go home.  If you experience worsening of your admission symptoms, develop shortness of breath, life threatening emergency, suicidal or  homicidal thoughts you must seek medical attention immediately by calling 911 or calling your MD immediately  if symptoms less severe.  You Must read complete instructions/literature along with all the possible adverse reactions/side effects for all the Medicines you take and that have been prescribed to you. Take any new Medicines after you have completely understood and accpet all the possible adverse reactions/side effects.   Increase activity slowly   Complete by:  As directed       Discharge Medications   Allergies as of 10/31/2017      Reactions   Nsaids Other (See Comments)   Cannot take due to Kidney disease, Kidney disease, Kidney function   Tolmetin Other (See Comments)   Cannot take due to Kidney Disease      Medication List    TAKE these medications   allopurinol 100 MG tablet Commonly known as:  ZYLOPRIM Take 100 mg by mouth at bedtime.   ALPRAZolam 1 MG tablet Commonly known as:  XANAX Take 1 mg by mouth 2 (two) times daily as needed for anxiety.   aspirin 81 MG chewable tablet Chew 1 tablet (81 mg total) by mouth daily. What changed:  when to take this   atorvastatin 40 MG tablet Commonly known as:  LIPITOR Take 40 mg by mouth at bedtime.   calcitRIOL 0.5 MCG capsule Commonly known as:  ROCALTROL Take 1 capsule (0.5 mcg total) by mouth daily with breakfast. What changed:  when to take this   cinacalcet 30 MG tablet Commonly known as:  SENSIPAR Take 1 tablet (30 mg total) by mouth daily.   diphenhydramine-acetaminophen 25-500 MG Tabs tablet Commonly known as:  TYLENOL PM Take 2 tablets by mouth at bedtime as needed (sleep).   fluticasone 50 MCG/ACT nasal spray Commonly known as:  FLONASE Place 1 spray into both nostrils daily as needed for allergies or rhinitis.   gentamicin cream 0.1 % Commonly known as:  GARAMYCIN Apply 1 application topically daily.   lisinopril 20 MG tablet Commonly known as:  PRINIVIL,ZESTRIL Take 10 mg by mouth at  bedtime.   metoprolol tartrate 25 MG tablet Commonly known as:  LOPRESSOR Take 25 mg by mouth every evening.   multivitamin Tabs tablet Take 1 tablet by mouth at bedtime.   mupirocin ointment 2 % Commonly known as:  BACTROBAN Apply 1 application topically 3 (three) times daily.   ondansetron 8 MG tablet Commonly known as:  ZOFRAN Take 8 mg by mouth every 8 (eight) hours as needed for nausea or vomiting.   pantoprazole 40 MG tablet  Commonly known as:  PROTONIX Take 1 tablet (40 mg total) by mouth daily.   promethazine 12.5 MG tablet Commonly known as:  PHENERGAN Take 12.5 mg by mouth every 6 (six) hours as needed for nausea or vomiting.   senna 8.6 MG Tabs tablet Commonly known as:  SENOKOT Take 2 tablets (17.2 mg total) by mouth at bedtime.   VELPHORO 500 MG chewable tablet Generic drug:  sucroferric oxyhydroxide Chew 1,000 mg by mouth 3 (three) times daily with meals.   venlafaxine XR 75 MG 24 hr capsule Commonly known as:  EFFEXOR-XR Take 75 mg by mouth at bedtime.       Follow-up Information    Long, Morrie Sheldon, [place]. Schedule an appointment as soon as possible for a visit in 1 week(s).   Specialty:  Physician Assistant Contact information: [address] [place] [address] [phone]        Levert Feinstein, MD. Schedule an appointment as soon as possible for a visit in 1 week(s).   Specialty:  Oncology Why:  recurrent anemia, negative GI workup Contact information: [address] [place] [address] [phone]           Major procedures and Radiology Reports - PLEASE review detailed and final reports thoroughly  -        Dg Chest 2 View  Result Date: 10/27/2017 CLINICAL DATA:  Central chest pain, shortness of breath, vomiting and lightheadedness this week. History of end-stage renal disease on dialysis. EXAM: CHEST - 2 VIEW COMPARISON:  Chest radiograph September 01, 2017 FINDINGS: Cardiomediastinal silhouette is normal. Low  inspiratory examination. No pleural effusions or focal consolidations. Trachea projects midline and there is no pneumothorax. Soft tissue planes and included osseous structures are non-suspicious. IMPRESSION: Negative. Electronically Signed   By: Awilda Metro M.D.   On: 10/27/2017 23:49    Micro Results    No results found for this or any previous visit (from the past 240 hour(s)).  Today   Subjective    Patricia [name] today has no headache,no chest abdominal pain,no new weakness tingling or numbness, feels much better wants to go home today.    Objective   Blood pressure 108/65, pulse 87, temperature 98.2 F (36.8 C), temperature source Oral, resp. rate 16, height 5\' 7"  (1.702 m), weight 113.9 kg, SpO2 97 %.  No intake or output data in the 24 hours ending 10/31/17 0821  Exam Awake Alert, Oriented x 3, No new F.N deficits, Normal affect [place].AT,PERRAL Supple Neck,No JVD, No cervical lymphadenopathy appriciated.  Symmetrical Chest wall movement, Good air movement bilaterally, CTAB RRR,No Gallops,Rubs or new Murmurs, No Parasternal Heave +ve B.Sounds, Abd Soft, Non tender, No organomegaly appriciated, No rebound -guarding or rigidity. No Cyanosis, Clubbing or edema, No new Rash or bruise   Data Review   CBC w Diff:  Lab Results  Component Value Date   WBC 6.3 10/30/2017   HGB 9.5 (L) 10/30/2017   HCT 29.8 (L) 10/30/2017   PLT 269 10/30/2017   LYMPHOPCT 32 09/01/2016   MONOPCT 9 09/01/2016   EOSPCT 1 09/01/2016   BASOPCT 0 09/01/2016    CMP:  Lab Results  Component Value Date   NA 137 10/30/2017   K 3.5 10/30/2017   CL 98 10/30/2017   CO2 22 10/30/2017   BUN 53 (H) 10/30/2017   CREATININE 14.53 (H) 10/30/2017   PROT 6.1 (L) 08/16/2017   ALBUMIN 3.2 (L) 10/30/2017   BILITOT 0.5 08/16/2017   ALKPHOS 44  08/16/2017   AST 22 08/16/2017   ALT <5 08/16/2017  .   Total Time in preparing paper work, data evaluation and todays exam - 62 minutes  Lala Lund M.D on 10/31/2017 at [place]  [phone]

## 2017-10-31 NOTE — Progress Notes (Addendum)
[facility] Progress Note   Subjective:   Seen and examined at bedside.  No complaints.  Ready to go home.  Wants to continue taking Velphoro binders because they work well and she likes them.  Objective Vitals:   10/30/17 1332 10/30/17 1752 10/30/17 2043 10/31/17 0444  BP: 121/73 116/71 123/70 108/65  Pulse: 71 67 65 87  Resp: $Remo'20 16 16 16  'XLVyj$ Temp: 98.5 F (36.9 C) 98.1 F (36.7 C) (!) 97.3 F (36.3 C) 98.2 F (36.8 C)  TempSrc:    Oral  SpO2: 98%  100% 97%  Weight:  113.9 kg    Height:       Physical Exam General:NAD, sitting on side of bed Heart:RRR Lungs:nml WOB, CTAB Extremities:no LE edema Dialysis Access: PD cath   [facility] Weights   10/29/17 1214 10/29/17 1727 10/30/17 1752  Weight: 116.5 kg 114.4 kg 113.9 kg   No intake or output data in the 24 hours ending 10/31/17 1015  Additional Objective Labs: Basic Metabolic Panel: Recent Labs  Lab 10/27/17 2309 10/29/17 0513 10/30/17 0438  NA 135 135 137  K 3.7 3.5 3.5  CL 97* 96* 98  CO2 23 21* 22  GLUCOSE 104* 99 112*  BUN 49* 56* 53*  CREATININE 13.75* 14.81* 14.53*  CALCIUM 8.1* 7.8* 8.5*  PHOS  --  5.8* 6.0*   Liver Function Tests: Recent Labs  Lab 10/29/17 0513 10/30/17 0438  ALBUMIN 2.9* 3.2*   CBC: Recent Labs  Lab 10/27/17 2309  10/29/17 0513 10/29/17 1505 10/29/17 2239 10/30/17 0438  WBC 9.3  --  6.0 5.0 6.7 6.3  HGB 7.3*   < > 8.2* 12.5 9.1* 9.5*  HCT 23.2*   < > 25.7* 39.7 27.5* 29.8*  MCV 96.7  --  89.5 88.8 89.6 90.9  PLT 332  --  236 156 271 269   < > = values in this interval not displayed.   Blood Culture    Component Value Date/Time   SDES PERITONEAL DIALYSATE 06/03/2017 0240   SPECREQUEST NONE 06/03/2017 0240   CULT  06/03/2017 0240    NO GROWTH 3 DAYS Performed at [facility], [place] [address], [place],  [address]    REPTSTATUS 06/06/2017 FINAL 06/03/2017 0240   CBG: Recent Labs  Lab 10/29/17 0746 10/30/17 0813 10/31/17 0812  GLUCAP  101* 101* 92   Studies/Results: No results found.  Medications: . sodium chloride    . dialysis solution 2.5% low-MG/low-CA     . atorvastatin  40 mg Oral q1800  . calcitRIOL  0.5 mcg Oral QHS  . cinacalcet  30 mg Oral Q breakfast  . gentamicin cream  1 application Topical Daily  . lisinopril  10 mg Oral QHS  . metoprolol tartrate  25 mg Oral QPM  . multivitamin  1 tablet Oral QHS  . pantoprazole  40 mg Oral Daily  . sodium chloride flush  3 mL Intravenous Q12H  . sodium chloride flush  3 mL Intravenous Q12H  . sucroferric oxyhydroxide  1,000 mg Oral TID WC  . venlafaxine XR  75 mg Oral QHS    Dialysis Orders: CCPD, EDW 108.5, uses 2.5 dextrose in all bags, exchanges 5, fill volume 3L, dwell time 2 hr, last FL 3 L, Day exchanges 1, volume 3 L, day dwell time 4 hours. She does day dwell from 5-9 PM  Assessment/Plan: 1.Acute on Chronic Anemia of CKD 2/2 GIB - Hgb 6.9>8.2>12.2>9.5>9.1 s/p 2Unit pRBC on 10/24.Upper GI endoscopy &colonoscopy  in 08/2017 unremarkable. Capsule study shows gastritis/duodenitis but no evidence of bleeding. Referring to hematology for additional work up.  2. ESRD -on PD. Continue regular orders. K 3.5.  3. Secondary hyperparathyroidism -CCa 9.1, Phos 6.0. Continue VDRA, sensipar and binders.  4. HTN/volume -Blood pressure soft this AM.On lisinopril and metoprolol. Appears euvolemic on exam although not to EDW. No post weight noted.  5. Nutrition -Alb 3.2. Renal diet w/fluid restrictions. Nepro. Renavite 6. Anxiety - per primary    Jen Mow, PA-C [facility] Pager: [phone] 10/31/2017,10:15 AM  LOS: 2 days   I have seen and examined this patient and agree with plan and assessment in the above note with renal recommendations/intervention highlighted.  Will need to have Hematology evaluation as an outpatient as no source of GI blood loss was identified.  Stable for discharge.  Broadus John A Nekayla Heider,MD 10/31/2017 12:15  PM

## 2017-11-01 ENCOUNTER — Encounter (HOSPITAL_COMMUNITY): Payer: Self-pay | Admitting: Gastroenterology

## 2017-12-05 DIAGNOSIS — K922 Gastrointestinal hemorrhage, unspecified: Secondary | ICD-10-CM

## 2017-12-05 HISTORY — DX: Gastrointestinal hemorrhage, unspecified: K92.2

## 2017-12-10 ENCOUNTER — Encounter: Payer: Self-pay | Admitting: Gastroenterology

## 2017-12-17 ENCOUNTER — Ambulatory Visit: Payer: Medicare Other | Admitting: Nurse Practitioner

## 2017-12-18 ENCOUNTER — Encounter (HOSPITAL_COMMUNITY): Payer: Self-pay

## 2017-12-18 ENCOUNTER — Emergency Department (HOSPITAL_COMMUNITY): Payer: Medicare Other

## 2017-12-18 ENCOUNTER — Observation Stay (HOSPITAL_COMMUNITY)
Admission: EM | Admit: 2017-12-18 | Discharge: 2017-12-19 | Disposition: A | Discharge status: Home / Self Care | Payer: Medicare Other | Attending: Internal Medicine | Admitting: Internal Medicine

## 2017-12-18 ENCOUNTER — Other Ambulatory Visit: Payer: Self-pay

## 2017-12-18 DIAGNOSIS — D649 Anemia, unspecified: Secondary | ICD-10-CM | POA: Diagnosis present

## 2017-12-18 DIAGNOSIS — F419 Anxiety disorder, unspecified: Secondary | ICD-10-CM | POA: Diagnosis not present

## 2017-12-18 DIAGNOSIS — Z886 Allergy status to analgesic agent status: Secondary | ICD-10-CM | POA: Diagnosis not present

## 2017-12-18 DIAGNOSIS — I120 Hypertensive chronic kidney disease with stage 5 chronic kidney disease or end stage renal disease: Principal | ICD-10-CM | POA: Insufficient documentation

## 2017-12-18 DIAGNOSIS — E876 Hypokalemia: Secondary | ICD-10-CM | POA: Diagnosis not present

## 2017-12-18 DIAGNOSIS — E785 Hyperlipidemia, unspecified: Secondary | ICD-10-CM | POA: Diagnosis not present

## 2017-12-18 DIAGNOSIS — K219 Gastro-esophageal reflux disease without esophagitis: Secondary | ICD-10-CM | POA: Diagnosis not present

## 2017-12-18 DIAGNOSIS — N186 End stage renal disease: Secondary | ICD-10-CM | POA: Diagnosis not present

## 2017-12-18 DIAGNOSIS — D631 Anemia in chronic kidney disease: Secondary | ICD-10-CM | POA: Diagnosis not present

## 2017-12-18 DIAGNOSIS — F329 Major depressive disorder, single episode, unspecified: Secondary | ICD-10-CM | POA: Insufficient documentation

## 2017-12-18 DIAGNOSIS — Z79899 Other long term (current) drug therapy: Secondary | ICD-10-CM | POA: Insufficient documentation

## 2017-12-18 DIAGNOSIS — I10 Essential (primary) hypertension: Secondary | ICD-10-CM | POA: Diagnosis present

## 2017-12-18 DIAGNOSIS — Z992 Dependence on renal dialysis: Secondary | ICD-10-CM | POA: Diagnosis not present

## 2017-12-18 DIAGNOSIS — R0902 Hypoxemia: Secondary | ICD-10-CM

## 2017-12-18 DIAGNOSIS — Z7982 Long term (current) use of aspirin: Secondary | ICD-10-CM | POA: Diagnosis not present

## 2017-12-18 DIAGNOSIS — E7800 Pure hypercholesterolemia, unspecified: Secondary | ICD-10-CM | POA: Diagnosis present

## 2017-12-18 LAB — CBC
HCT: 26 % — ABNORMAL LOW (ref 36.0–46.0)
HEMOGLOBIN: 8.1 g/dL — AB (ref 12.0–15.0)
MCH: 28.8 pg (ref 26.0–34.0)
MCHC: 31.2 g/dL (ref 30.0–36.0)
MCV: 92.5 fL (ref 80.0–100.0)
Platelets: 306 10*3/uL (ref 150–400)
RBC: 2.81 MIL/uL — ABNORMAL LOW (ref 3.87–5.11)
RDW: 15.3 % (ref 11.5–15.5)
WBC: 7 10*3/uL (ref 4.0–10.5)
nRBC: 1.3 % — ABNORMAL HIGH (ref 0.0–0.2)

## 2017-12-18 LAB — I-STAT TROPONIN, ED: Troponin i, poc: 0.01 ng/mL (ref 0.00–0.08)

## 2017-12-18 LAB — RENAL FUNCTION PANEL
ANION GAP: 22 — AB (ref 5–15)
Albumin: 3.3 g/dL — ABNORMAL LOW (ref 3.5–5.0)
BUN: 64 mg/dL — ABNORMAL HIGH (ref 6–20)
CALCIUM: 7.9 mg/dL — AB (ref 8.9–10.3)
CO2: 23 mmol/L (ref 22–32)
Chloride: 94 mmol/L — ABNORMAL LOW (ref 98–111)
Creatinine, Ser: 16.3 mg/dL — ABNORMAL HIGH (ref 0.44–1.00)
GFR calc Af Amer: 3 mL/min — ABNORMAL LOW (ref >60)
GFR calc non Af Amer: 2 mL/min — ABNORMAL LOW (ref >60)
Glucose, Bld: 113 mg/dL — ABNORMAL HIGH (ref 70–99)
Phosphorus: 3.5 mg/dL (ref 2.5–4.6)
Potassium: 3.3 mmol/L — ABNORMAL LOW (ref 3.5–5.1)
SODIUM: 139 mmol/L (ref 135–145)

## 2017-12-18 LAB — PREPARE RBC (CROSSMATCH)

## 2017-12-18 MED ORDER — GENTAMICIN SULFATE 0.1 % EX CREA
1.0000 "application " | TOPICAL_CREAM | Freq: Every day | CUTANEOUS | Status: DC
  Administered 2017-12-19: 1 via TOPICAL
  Filled 2017-12-18: qty 15

## 2017-12-18 MED ORDER — SUCROFERRIC OXYHYDROXIDE 500 MG PO CHEW
1000.0000 mg | CHEWABLE_TABLET | Freq: Three times a day (TID) | ORAL | Status: DC
  Administered 2017-12-19: 1000 mg via ORAL
  Filled 2017-12-18 (×3): qty 2

## 2017-12-18 MED ORDER — PANTOPRAZOLE SODIUM 40 MG PO TBEC
40.0000 mg | DELAYED_RELEASE_TABLET | Freq: Every day | ORAL | Status: DC
  Administered 2017-12-18 – 2017-12-19 (×2): 40 mg via ORAL
  Filled 2017-12-18 (×2): qty 1

## 2017-12-18 MED ORDER — SODIUM CHLORIDE 0.9% IV SOLUTION
Freq: Once | INTRAVENOUS | Status: AC
  Administered 2017-12-18: 23:00:00 via INTRAVENOUS

## 2017-12-18 MED ORDER — ONDANSETRON HCL 4 MG PO TABS: 4.0000 mg | ORAL_TABLET | Freq: Four times a day (QID) | ORAL | Status: DC | PRN

## 2017-12-18 MED ORDER — VENLAFAXINE HCL ER 75 MG PO CP24
75.0000 mg | ORAL_CAPSULE | Freq: Every day | ORAL | Status: DC
  Administered 2017-12-18: 75 mg via ORAL
  Filled 2017-12-18: qty 1

## 2017-12-18 MED ORDER — ALPRAZOLAM 0.5 MG PO TABS
1.0000 mg | ORAL_TABLET | Freq: Two times a day (BID) | ORAL | Status: DC | PRN
  Administered 2017-12-18: 1 mg via ORAL
  Filled 2017-12-18: qty 2

## 2017-12-18 MED ORDER — DELFLEX-LC/1.5% DEXTROSE 344 MOSM/L IP SOLN
INTRAPERITONEAL | Status: DC
  Administered 2017-12-18: via INTRAPERITONEAL

## 2017-12-18 MED ORDER — CALCITRIOL 0.5 MCG PO CAPS
0.5000 ug | ORAL_CAPSULE | Freq: Every day | ORAL | Status: DC
  Administered 2017-12-18: 0.5 ug via ORAL
  Filled 2017-12-18: qty 1

## 2017-12-18 MED ORDER — DELFLEX-LC/2.5% DEXTROSE 394 MOSM/L IP SOLN
INTRAPERITONEAL | Status: DC
  Administered 2017-12-18: via INTRAPERITONEAL

## 2017-12-18 MED ORDER — CINACALCET HCL 30 MG PO TABS
30.0000 mg | ORAL_TABLET | Freq: Every day | ORAL | Status: DC
  Administered 2017-12-19: 30 mg via ORAL
  Filled 2017-12-18: qty 1

## 2017-12-18 MED ORDER — SENNA 8.6 MG PO TABS
2.0000 | ORAL_TABLET | Freq: Every day | ORAL | Status: DC
  Administered 2017-12-18: 17.2 mg via ORAL
  Filled 2017-12-18: qty 2

## 2017-12-18 MED ORDER — ONDANSETRON HCL 4 MG/2ML IJ SOLN: 4.0000 mg | Freq: Four times a day (QID) | INTRAMUSCULAR | Status: DC | PRN

## 2017-12-18 MED ORDER — ACETAMINOPHEN 325 MG PO TABS
650.0000 mg | ORAL_TABLET | Freq: Four times a day (QID) | ORAL | Status: DC | PRN
  Administered 2017-12-19: 650 mg via ORAL
  Filled 2017-12-18: qty 2

## 2017-12-18 MED ORDER — ATORVASTATIN CALCIUM 40 MG PO TABS
40.0000 mg | ORAL_TABLET | Freq: Every day | ORAL | Status: DC
  Administered 2017-12-18: 40 mg via ORAL
  Filled 2017-12-18: qty 1

## 2017-12-18 MED ORDER — ACETAMINOPHEN 650 MG RE SUPP: 650.0000 mg | Freq: Four times a day (QID) | RECTAL | Status: DC | PRN

## 2017-12-18 MED ORDER — RENA-VITE PO TABS
1.0000 | ORAL_TABLET | Freq: Every day | ORAL | Status: DC
  Administered 2017-12-18: 1 via ORAL
  Filled 2017-12-18: qty 1

## 2017-12-18 NOTE — ED Notes (Signed)
Delay in lab draw,  Pt not in room at this time. 

## 2017-12-18 NOTE — H&P (Signed)
History and Physical    [name] PFX:[id number] DOB: Nov 09, 1975 DOA: 12/18/2017  PCP: Blair Heys, PA-C  Patient coming from: Home  I have personally briefly reviewed patient's old medical records in [place]  Chief Complaint: Lightheadedness, shortness of breath  HPI: Patricia [name] is a 42 y.o. female with medical history significant for ESRD on peritoneal dialysis, hypertension, hyperlipidemia, iron deficiency anemia, GERD, anemia who presents with about 2 days of exertional lightheadedness and dyspnea.  She says she saw her nephrologist, Dr. Jimmy Footman, last week and was noted to have a hemoglobin around 7.  She was offered a blood transfusion but declined at that time.  Over the last 2 days her symptoms persisted and she is becoming more lightheaded with exertion which would resolve with rest.  She reports fatigue and frequent craving for ice chips.  She reports chronic nausea which is unchanged without emesis.  She reports fair oral intake with soft/liquid diet which is also unchanged.  She reports regular bowel movements which are dark in discoloration which she attributes to her oral iron supplementation.  She says in the past she has had blood in the stool which she can generally tell by smell, which is not present at this time.  She denies any obvious bleeding.  She denies any fevers, diaphoresis, palpitations, or peripheral edema.  Upper and lower endoscopies in August 2019 showed gastritis on EGD and congested friable colonic mucosa and polyps on colonoscopy.  She underwent a capsule endoscopy on 10/29/2017 which showed no source of anemia or active bleed.  Labs were notable for hemoglobin 8.1 (9.5 on 10/30/17 and reportedly ~7 last week at Nephrology clinic), WBC 7.0, platelets 306, K 3.3, BUN 64 (baseline ~50), Ph 3.5, i-STAT troponin 0.01.  Chest x-ray was without consolidation, infiltrate, pulmonary edema, or effusion.  EKG showed normal sinus rhythm with nonspecific  T wave changes V2-V3, unchanged from prior.  Patient was noted to have oxygen desaturation on ambulation as low as mid 70s which improved upon sitting back down in bed.  ED Course:  Initial vitals showed BP 134/84, pulse 79, RR 18, SPO2 100% on room air.  Review of Systems: As per HPI otherwise 10 point review of systems negative.    Past Medical History:  Diagnosis Date  . Acid reflux   . Anxiety   . Arthritis   . Dyspnea   . Dysrhythmia    tachycardia  . ESRD (end stage renal disease) ([place])    TTHSAT- [employer]  . Gout   . Headache(784.0)    "related to chemo; sometimes weekly" (09/12/2013)  . High cholesterol   . History of blood transfusion "a couple"   "related to low counts"  . Hypertension   . Iron deficiency anemia    "get epogen shots q month" (02/20/2014)  . 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 Peritoneal dialysis status [facility])     Past Surgical History:  Procedure Laterality Date  . ABDOMINAL HYSTERECTOMY  2010   "laparoscopic"  . ANKLE FRACTURE SURGERY Right 1994  . AV FISTULA PLACEMENT Left 04/14/2016   Procedure: ARTERIOVENOUS (AV) FISTULA CREATION;  Surgeon: Angelia Mould, MD;  Location: [facility] OR;  Service: Vascular;  Laterality: Left;  . AV FISTULA PLACEMENT Left 08/09/2017   Procedure: INSERTION OF ARTERIOVENOUS (AV) GORE-TEX GRAFT LEFT ARM;  Surgeon: Elam Dutch, MD;  Location: [place];  Service: Vascular;  Laterality: Left;  .  [place] REMOVAL Left 08/11/2017   Procedure: REMOVAL OF ARTERIOVENOUS GORETEX GRAFT ([place]);  Surgeon: Serafina Mitchell, MD;  Location: [facility] OR;  Service: Vascular;  Laterality: Left;  . BIOPSY  09/03/2017   Procedure: BIOPSY;  Surgeon: Otis Brace, MD;  Location: [place];  Service: Gastroenterology;;  . CARDIAC CATHETERIZATION  2000's  . COLONOSCOPY WITH PROPOFOL N/A 09/04/2017   Procedure: COLONOSCOPY WITH PROPOFOL;  Surgeon: Ronnette Juniper, MD;  Location: [place];  Service: Gastroenterology;  Laterality: N/A;  . ESOPHAGOGASTRODUODENOSCOPY    . ESOPHAGOGASTRODUODENOSCOPY (EGD) WITH PROPOFOL Left 06/04/2017   Procedure: ESOPHAGOGASTRODUODENOSCOPY (EGD) WITH PROPOFOL;  Surgeon: Arta Silence, MD;  Location: [place];  Service: Endoscopy;  Laterality: Left;  . ESOPHAGOGASTRODUODENOSCOPY (EGD) WITH PROPOFOL N/A 09/03/2017   Procedure: ESOPHAGOGASTRODUODENOSCOPY (EGD) WITH PROPOFOL;  Surgeon: Otis Brace, MD;  Location: [place];  Service: Gastroenterology;  Laterality: N/A;  . FISTULA SUPERFICIALIZATION Left 04/02/2017   Procedure: FISTULA SUPERFICIALIZATION LEFT ARM;  Surgeon: Serafina Mitchell, MD;  Location: [place];  Service: Vascular;  Laterality: Left;  . FISTULOGRAM Left 04/07/2016   Procedure: FISTULOGRAM;  Surgeon: Waynetta Sandy, MD;  Location: [place];  Service: Vascular;  Laterality: Left;  . FRACTURE SURGERY     right ankle  . GIVENS CAPSULE STUDY N/A 10/29/2017   Procedure: GIVENS CAPSULE STUDY;  Surgeon: Wilford Corner, MD;  Location: [place];  Service: Endoscopy;  Laterality: N/A;  . INSERTION OF DIALYSIS CATHETER Right 04/07/2016   Procedure: INSERTION OF DIALYSIS CATHETER;  Surgeon: Waynetta Sandy, MD;  Location: [place];  Service: Vascular;  Laterality: Right;  . INSERTION OF DIALYSIS CATHETER Right 04/04/2017   Procedure: INSERTION OF DIALYSIS CATHETER;  Surgeon: Waynetta Sandy, MD;  Location: [place];  Service: Vascular;  Laterality: Right;  . LIGATION OF ARTERIOVENOUS  FISTULA Left 04/14/2016   Procedure: LIGATION OF ARTERIOVENOUS  FISTULA;  Surgeon: Angelia Mould, MD;  Location: [place];  Service: Vascular;  Laterality: Left;  . PATCH ANGIOPLASTY Left 06/19/2016   Procedure: PATCH ANGIOPLASTY LEFT ARTERIOVENOUS FISTULA;  Surgeon: Angelia Mould, MD;  Location: [place];  Service: Vascular;  Laterality: Left;  . POLYPECTOMY  09/04/2017   Procedure: POLYPECTOMY;  Surgeon: Ronnette Juniper, MD;  Location: [facility] ENDOSCOPY;  Service: Gastroenterology;;  . REVISON OF ARTERIOVENOUS FISTULA Left 06/19/2016   Procedure: REVISION SUPERFICIALIZATION OF BRACHIOCEPHALIC ARTERIOVENOUS FISTULA;  Surgeon: Angelia Mould, MD;  Location: [place];  Service: Vascular;  Laterality: Left;     reports that she has never smoked. She has never used smokeless tobacco. She reports that she does not drink alcohol or use drugs.  Allergies  Allergen Reactions  . Nsaids Other (See Comments)    Cannot take due to Kidney disease, Kidney disease, Kidney function   . Tolmetin Other (See Comments)    Cannot take due to Kidney Disease    Family History  Problem Relation Age of Onset  . Hypertension Mother   . Thyroid disease Mother   . Coronary artery disease Father   . Hypertension Father   . Diabetes Father      Prior to Admission medications   Medication Sig Start Date End Date Taking? Authorizing Provider  allopurinol (ZYLOPRIM) 100 MG tablet Take 100 mg by mouth at bedtime.    Yes [provider]  ALPRAZolam Duanne Moron) 1 MG tablet Take 1 mg by mouth 2 (two) times daily as needed for anxiety.  03/19/17  Yes [provider]  aspirin 81 MG chewable tablet Chew  1 tablet (81 mg total) by mouth daily. Patient taking differently: Chew 81 mg by mouth at bedtime.  10/03/13  Yes Rodolph Bong, MD  atorvastatin (LIPITOR) 40 MG tablet Take 40 mg by mouth at bedtime.  03/14/14  Yes [provider]  calcitRIOL (ROCALTROL) 0.5 MCG capsule Take 1 capsule (0.5 mcg total) by mouth daily with breakfast. Patient taking differently: Take 0.5 mcg by mouth at bedtime.  06/04/17  Yes Osvaldo Shipper, MD  cinacalcet (SENSIPAR) 30 MG tablet Take 1 tablet (30 mg total) by mouth daily. 08/19/17  Yes Burnadette Pop, MD  diphenhydramine-acetaminophen (TYLENOL PM) 25-500 MG TABS tablet Take 2 tablets by mouth at bedtime as needed (sleep).    Yes [provider]  metoprolol tartrate  (LOPRESSOR) 25 MG tablet Take 25 mg by mouth every evening.  04/17/16  Yes [provider]  multivitamin (RENA-VIT) TABS tablet Take 1 tablet by mouth at bedtime.    Yes [provider]  pantoprazole (PROTONIX) 40 MG tablet Take 1 tablet (40 mg total) by mouth daily. 09/04/17  Yes Burnadette Pop, MD  senna (SENOKOT) 8.6 MG TABS tablet Take 2 tablets (17.2 mg total) by mouth at bedtime. 04/15/16  Yes Rhetta Mura, MD  sucroferric oxyhydroxide (VELPHORO) 500 MG chewable tablet Chew 1,000 mg by mouth 3 (three) times daily with meals.    Yes [provider]  venlafaxine XR (EFFEXOR-XR) 75 MG 24 hr capsule Take 75 mg by mouth at bedtime.    Yes [provider]    Physical Exam: Vitals:   12/18/17 1845 12/18/17 2100 12/18/17 2115  BP: 134/84 133/83 128/74  Pulse: 79 73 65  Resp: 18 (!) 24 18  SpO2: 100% 100% 100%    Constitutional: NAD, calm, comfortable Eyes: PERRL, mild conjunctival pallor ENMT: Mucous membranes are moist. Posterior pharynx clear of any exudate or lesions.Normal dentition.  Neck: normal, supple, no masses. Respiratory: clear to auscultation bilaterally, no wheezing, no crackles. Normal respiratory effort. No accessory muscle use.  Cardiovascular: Regular rate and rhythm, no murmurs / rubs / gallops. No extremity edema. 2+ pedal pulses.  Cap refill <2 secs. Abdomen: PD catheter in place, no tenderness, no masses palpated. No hepatosplenomegaly. Bowel sounds positive.  Musculoskeletal: no clubbing / cyanosis. No joint deformity upper and lower extremities. Good ROM, no contractures. Normal muscle tone.  Skin: no rashes, lesions, ulcers. No induration Neurologic: CN 2-12 grossly intact. Sensation intact. Strength 5/5 in all 4.  Psychiatric: Normal judgment and insight. Alert and oriented x 3. Normal mood.     Labs on Admission: I have personally reviewed following labs and imaging studies  CBC: Recent Labs  Lab 12/18/17 1836    WBC 7.0  HGB 8.1*  HCT 26.0*  MCV 92.5  PLT 306   Basic Metabolic Panel: Recent Labs  Lab 12/18/17 1836  NA 139  K 3.3*  CL 94*  CO2 23  GLUCOSE 113*  BUN 64*  CREATININE 16.30*  CALCIUM 7.9*  PHOS 3.5   GFR: CrCl cannot be calculated (Unknown ideal weight.). Liver Function Tests: Recent Labs  Lab 12/18/17 1836  ALBUMIN 3.3*   No results for input(s): LIPASE, AMYLASE in the last 168 hours. No results for input(s): AMMONIA in the last 168 hours. Coagulation Profile: No results for input(s): INR, PROTIME in the last 168 hours. Cardiac Enzymes: No results for input(s): CKTOTAL, CKMB, CKMBINDEX, TROPONINI in the last 168 hours. BNP (last 3 results) No results for input(s): PROBNP in the last 8760 hours.  HbA1C: No results for input(s): HGBA1C in the last 72 hours. CBG: No results for input(s): GLUCAP in the last 168 hours. Lipid Profile: No results for input(s): CHOL, HDL, LDLCALC, TRIG, CHOLHDL, LDLDIRECT in the last 72 hours. Thyroid Function Tests: No results for input(s): TSH, T4TOTAL, FREET4, T3FREE, THYROIDAB in the last 72 hours. Anemia Panel: No results for input(s): VITAMINB12, FOLATE, FERRITIN, TIBC, IRON, RETICCTPCT in the last 72 hours. Urine analysis:    Component Value Date/Time   COLORURINE YELLOW 04/03/2016 1626   APPEARANCEUR HAZY (A) 04/03/2016 1626   LABSPEC 1.012 04/03/2016 1626   PHURINE 6.0 04/03/2016 1626   GLUCOSEU 150 (A) 04/03/2016 1626   HGBUR NEGATIVE 04/03/2016 1626   BILIRUBINUR NEGATIVE 04/03/2016 1626   KETONESUR NEGATIVE 04/03/2016 1626   PROTEINUR >=300 (A) 04/03/2016 1626   UROBILINOGEN 0.2 09/19/2014 1620   NITRITE NEGATIVE 04/03/2016 1626   LEUKOCYTESUR NEGATIVE 04/03/2016 1626    Radiological Exams on Admission: Dg Chest 2 View  Result Date: 12/18/2017 CLINICAL DATA:  Anemia.  Dialysis.  Shortness of breath. EXAM: CHEST - 2 VIEW COMPARISON:  10/27/2017 FINDINGS: The heart size and mediastinal contours are within  normal limits. Both lungs are clear. The visualized skeletal structures are unremarkable. IMPRESSION: No active cardiopulmonary disease. Electronically Signed   By: Van Clines M.D.   On: 12/18/2017 20:00    EKG: Independently reviewed. normal sinus rhythm with nonspecific T wave changes V2-V3, unchanged from prior.  Assessment/Plan Principal Problem:   Symptomatic anemia Active Problems:   Morbid obesity ([place])   HTN (hypertension)   Hypercholesterolemia   Anxiety   Patricia [name] is a 42 y.o. female with medical history significant for ESRD on peritoneal dialysis, hypertension, hyperlipidemia, iron deficiency anemia, GERD, anemia who presents with about 2 days of exertional lightheadedness, dyspnea, fatigue found to be hypoxic with ambulation in the ED suspect secondary to symptomatic anemia.  Symptomatic anemia: Recurrent issue without obvious GI bleeding source on upper, lower, and capsule endoscopy.  Likely multifactorial from anemia of chronic disease and iron deficiency.  Hemoglobin is 8.1, however given her symptoms will transfuse 1 unit PRBC and monitor response. -Transfuse 1 unit PRBC, check posttransfusion H&H -Check ferritin, consider IV iron transfusion if low -Check FOBT -Continue oral iron supplement -Continue Protonix  ESRD on peritoneal dialysis: Follows with Dr. Jimmy Footman with [facility].  ED provider to discuss with nephrology in regards to need for PD overnight.  BUN increased from prior, however patient not uremic and not hyperkalemic.  Appears euvolemic.  Hypertension: Currently normotensive, will hold home Lopressor given lightheadedness on ambulation.  Hyperlipidemia: Continue home atorvastatin  Anxiety: Continue home Effexor and as needed Xanax   DVT prophylaxis: SCDs Code Status: Full code Family Communication: None present on admission Disposition Plan: Anticipate discharge tomorrow if oxygen saturation on ambulation improves and  hemoglobin stable. Consults called: EDP to discuss with nephrology Admission status: Observation   Zada Finders MD [facility] Pager [phone]  If 7PM-7AM, please contact night-coverage [email or web address] Password [facility]  12/18/2017, 9:57 PM

## 2017-12-18 NOTE — ED Triage Notes (Signed)
Per pt: She was told last week that "my blood count was 7 and I needed a blood transfusion, but I didn't really feel like. The last couple of days I have been real winded and I have been getting dizzy and light headed when I stand up". Pt does at home peritoneal dialysis. Denies fevers. Pts mouth and eyes are pale. Pt is alert and oriented 4.

## 2017-12-18 NOTE — ED Notes (Signed)
Upon standing for few mins, started at 100% spo2. After walking, began to drop and  Maintain 92-93%. Upon turning around and heading back to room, sats began dropping again, registering 72-74% for a few seconds. After getting closer to the room, sats increased to 85% and then upon sitting back down in bed, spo2 rose in few seconds to 99-100% with good pleth wave. Pt stated that she felt little dizzy during ambulation (which is not normal for her), but not any increased shortness of breath.

## 2017-12-18 NOTE — ED Provider Notes (Signed)
[facility] EMERGENCY DEPARTMENT Provider Note   CSN: [id number] Arrival date & time: 12/18/17  1820  History   Chief Complaint Chief Complaint  Patient presents with  . Shortness of Breath    HPI Patricia [name] is a 42 y.o. female w/ PMH of anxiety, ESRD on PD, HTN, iron deficiency anemia presenting with progressive SOB and DOE over the past week. She also endorses chest pain with exertion, feeling lightheaded and dizzy. She reports last week she was called by nurse and told blood counts were low and to go to ED if she had symptoms. Patient reports "I just laid around but the shortness of breath is getting worse" so she decided to come in. She denies seeing blood in her stools or having dark tarry stools. She makes minimal urine, no blood in it. She reports recent normal capsule endoscopy and a colonoscopy in August that did not illicit cause of anemia.   HPI  Past Medical History:  Diagnosis Date  . Acid reflux   . Anxiety   . Arthritis   . Dyspnea   . Dysrhythmia    tachycardia  . ESRD (end stage renal disease) ([place])    TTHSAT- [place]  . Gout   . Headache(784.0)    "related to chemo; sometimes weekly" (09/12/2013)  . High cholesterol   . History of blood transfusion "a couple"   "related to low counts"  . Hypertension   . Iron deficiency anemia    "get epogen shots q month" (02/20/2014)  . 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 Peritoneal dialysis status [facility])     Patient Active Problem List   Diagnosis Date Noted  . Symptomatic anemia 10/28/2017  . Esophageal candidiasis ([place]) 09/01/2017  . Anxiety 09/01/2017  . Stroke ([place]) 08/15/2017  . Dysphagia 08/15/2017  . Melena 06/03/2017  . Abdominal pain 06/03/2017  . Dialysis AV fistula malfunction ([place]) 04/04/2017  . ESRD (end stage renal disease) ([place]) 04/01/2016  . Anemia due to chronic kidney disease 04/01/2016  . GERD  (gastroesophageal reflux disease) 04/01/2016  . Hypomagnesemia 04/28/2015  . Acute renal failure superimposed on stage 3 chronic kidney disease ([place]) 04/12/2014  . Atypical chest pain   . Chest pain 02/19/2014  . Hypokalemia 02/19/2014  . Nephrotic syndrome 10/02/2013  . Dyslipidemia 10/02/2013  . PAT (paroxysmal atrial tachycardia) ([place]) 09/29/2013  . Normocytic anemia 06/29/2013  . Edema 06/29/2013  . Nausea and vomiting 06/29/2013  . Fever, unspecified 01/31/2013  . Glomerulonephritis 01/28/2013  . Syncope 01/28/2013  . Shortness of breath 07/04/2011  . Morbid obesity ([place]) 07/04/2011  . HTN (hypertension) 07/04/2011  . Hypercholesterolemia 07/04/2011    Past Surgical History:  Procedure Laterality Date  . ABDOMINAL HYSTERECTOMY  2010   "laparoscopic"  . ANKLE FRACTURE SURGERY Right 1994  . AV FISTULA PLACEMENT Left 04/14/2016   Procedure: ARTERIOVENOUS (AV) FISTULA CREATION;  Surgeon: Angelia Mould, MD;  Location: [facility] OR;  Service: Vascular;  Laterality: Left;  . AV FISTULA PLACEMENT Left 08/09/2017   Procedure: INSERTION OF ARTERIOVENOUS (AV) GORE-TEX GRAFT LEFT ARM;  Surgeon: Elam Dutch, MD;  Location: [place];  Service: Vascular;  Laterality: Left;  . [place] REMOVAL Left 08/11/2017   Procedure: REMOVAL OF ARTERIOVENOUS GORETEX GRAFT ([place]);  Surgeon: Serafina Mitchell, MD;  Location: [facility] OR;  Service: Vascular;  Laterality: Left;  . BIOPSY  09/03/2017   Procedure: BIOPSY;  Surgeon: Otis Brace,  MD;  Location: [place];  Service: Gastroenterology;;  . CARDIAC CATHETERIZATION  2000's  . COLONOSCOPY WITH PROPOFOL N/A 09/04/2017   Procedure: COLONOSCOPY WITH PROPOFOL;  Surgeon: Ronnette Juniper, MD;  Location: [place];  Service: Gastroenterology;  Laterality: N/A;  . ESOPHAGOGASTRODUODENOSCOPY    . ESOPHAGOGASTRODUODENOSCOPY (EGD) WITH PROPOFOL Left 06/04/2017   Procedure: ESOPHAGOGASTRODUODENOSCOPY (EGD) WITH PROPOFOL;  Surgeon: Arta Silence, MD;  Location: [place];  Service: Endoscopy;  Laterality: Left;  . ESOPHAGOGASTRODUODENOSCOPY (EGD) WITH PROPOFOL N/A 09/03/2017   Procedure: ESOPHAGOGASTRODUODENOSCOPY (EGD) WITH PROPOFOL;  Surgeon: Otis Brace, MD;  Location: [place];  Service: Gastroenterology;  Laterality: N/A;  . FISTULA SUPERFICIALIZATION Left 04/02/2017   Procedure: FISTULA SUPERFICIALIZATION LEFT ARM;  Surgeon: Serafina Mitchell, MD;  Location: [place];  Service: Vascular;  Laterality: Left;  . FISTULOGRAM Left 04/07/2016   Procedure: FISTULOGRAM;  Surgeon: Waynetta Sandy, MD;  Location: [place];  Service: Vascular;  Laterality: Left;  . FRACTURE SURGERY     right ankle  . GIVENS CAPSULE STUDY N/A 10/29/2017   Procedure: GIVENS CAPSULE STUDY;  Surgeon: Wilford Corner, MD;  Location: [place];  Service: Endoscopy;  Laterality: N/A;  . INSERTION OF DIALYSIS CATHETER Right 04/07/2016   Procedure: INSERTION OF DIALYSIS CATHETER;  Surgeon: Waynetta Sandy, MD;  Location: [place];  Service: Vascular;  Laterality: Right;  . INSERTION OF DIALYSIS CATHETER Right 04/04/2017   Procedure: INSERTION OF DIALYSIS CATHETER;  Surgeon: Waynetta Sandy, MD;  Location: [place];  Service: Vascular;  Laterality: Right;  . LIGATION OF ARTERIOVENOUS  FISTULA Left 04/14/2016   Procedure: LIGATION OF ARTERIOVENOUS  FISTULA;  Surgeon: Angelia Mould, MD;  Location: [place];  Service: Vascular;  Laterality: Left;  . PATCH ANGIOPLASTY Left 06/19/2016   Procedure: PATCH ANGIOPLASTY LEFT ARTERIOVENOUS FISTULA;  Surgeon: Angelia Mould, MD;  Location: [place];  Service: Vascular;  Laterality: Left;  . POLYPECTOMY  09/04/2017   Procedure: POLYPECTOMY;  Surgeon: Ronnette Juniper, MD;  Location: [facility] ENDOSCOPY;  Service: Gastroenterology;;  . REVISON OF ARTERIOVENOUS FISTULA Left 06/19/2016   Procedure: REVISION SUPERFICIALIZATION OF BRACHIOCEPHALIC ARTERIOVENOUS FISTULA;  Surgeon: Angelia Mould, MD;  Location[facility] OR;  Service:  Vascular;  Laterality: Left;     OB History    Gravida  3   Para      Term      Preterm      AB      Living        SAB      TAB      Ectopic      Multiple  3   Live Births               Home Medications    Prior to Admission medications   Medication Sig Start Date End Date Taking? Authorizing Provider  allopurinol (ZYLOPRIM) 100 MG tablet Take 100 mg by mouth at bedtime.    Yes [provider]  ALPRAZolam Duanne Moron) 1 MG tablet Take 1 mg by mouth 2 (two) times daily as needed for anxiety.  03/19/17  Yes [provider]  aspirin 81 MG chewable tablet Chew 1 tablet (81 mg total) by mouth daily. Patient taking differently: Chew 81 mg by mouth at bedtime.  10/03/13  Yes Eugenie Filler, MD  atorvastatin (LIPITOR) 40 MG tablet Take 40 mg by mouth at bedtime.  03/14/14  Yes [provider]  calcitRIOL (ROCALTROL) 0.5 MCG capsule Take 1 capsule (0.5 mcg total) by mouth daily with  breakfast. Patient taking differently: Take 0.5 mcg by mouth at bedtime.  06/04/17  Yes Osvaldo Shipper, MD  cinacalcet (SENSIPAR) 30 MG tablet Take 1 tablet (30 mg total) by mouth daily. 08/19/17  Yes Burnadette Pop, MD  diphenhydramine-acetaminophen (TYLENOL PM) 25-500 MG TABS tablet Take 2 tablets by mouth at bedtime as needed (sleep).    Yes [provider]  metoprolol tartrate (LOPRESSOR) 25 MG tablet Take 25 mg by mouth every evening.  04/17/16  Yes [provider]  multivitamin (RENA-VIT) TABS tablet Take 1 tablet by mouth at bedtime.    Yes [provider]  pantoprazole (PROTONIX) 40 MG tablet Take 1 tablet (40 mg total) by mouth daily. 09/04/17  Yes Burnadette Pop, MD  senna (SENOKOT) 8.6 MG TABS tablet Take 2 tablets (17.2 mg total) by mouth at bedtime. 04/15/16  Yes Rhetta Mura, MD  sucroferric oxyhydroxide (VELPHORO) 500 MG chewable tablet Chew 1,000 mg by mouth 3 (three) times daily with meals.    Yes [provider]    venlafaxine XR (EFFEXOR-XR) 75 MG 24 hr capsule Take 75 mg by mouth at bedtime.    Yes [provider]    Family History Family History  Problem Relation Age of Onset  . Hypertension Mother   . Thyroid disease Mother   . Coronary artery disease Father   . Hypertension Father   . Diabetes Father     Social History Social History   Tobacco Use  . Smoking status: Never Smoker  . Smokeless tobacco: Never Used  Substance Use Topics  . Alcohol use: No  . Drug use: No     Allergies   Nsaids and Tolmetin   Review of Systems Review of Systems  Constitutional: Positive for fatigue. Negative for activity change, appetite change, chills and fever.  HENT: Negative for congestion and sore throat.   Eyes: Negative for photophobia and visual disturbance.  Respiratory: Positive for shortness of breath. Negative for cough and chest tightness.   Cardiovascular: Positive for chest pain.  Gastrointestinal: Negative for abdominal pain, anal bleeding, blood in stool, constipation, diarrhea, nausea and vomiting.  Genitourinary: Negative for dysuria.  Musculoskeletal: Negative for gait problem.  Skin: Negative for rash.  Neurological: Positive for dizziness and light-headedness. Negative for seizures, syncope, speech difficulty, numbness and headaches.  Psychiatric/Behavioral: The patient is not nervous/anxious.      Physical Exam Updated Vital Signs BP 134/84   Pulse 79   Resp 18   SpO2 100%   Physical Exam Vitals signs and nursing note reviewed.  Constitutional:      Appearance: She is well-developed. She is obese.  HENT:     Head: Normocephalic and atraumatic.     Mouth/Throat:     Mouth: Mucous membranes are moist.     Pharynx: Oropharynx is clear.  Eyes:     Extraocular Movements: Extraocular movements intact.     Pupils: Pupils are equal, round, and reactive to light.  Neck:     Musculoskeletal: Normal range of motion and neck supple.  Cardiovascular:      Rate and Rhythm: Normal rate and regular rhythm.  Pulmonary:     Effort: Pulmonary effort is normal.     Breath sounds: Normal breath sounds.  Chest:     Chest wall: No tenderness.  Abdominal:     General: Bowel sounds are normal.     Palpations: Abdomen is soft.     Tenderness: There is no abdominal tenderness.  Musculoskeletal:     Right  lower leg: No edema.     Left lower leg: No edema.  Skin:    General: Skin is warm and dry.  Neurological:     General: No focal deficit present.     Mental Status: She is alert.  Psychiatric:        Mood and Affect: Mood normal.        Behavior: Behavior normal.      ED Treatments / Results  Labs (all labs ordered are listed, but only abnormal results are displayed) Labs Reviewed  CBC - Abnormal; Notable for the following components:      Result Value   RBC 2.81 (*)    Hemoglobin 8.1 (*)    HCT 26.0 (*)    nRBC 1.3 (*)    All other components within normal limits  RENAL FUNCTION PANEL - Abnormal; Notable for the following components:   Potassium 3.3 (*)    Chloride 94 (*)    Glucose, Bld 113 (*)    BUN 64 (*)    Creatinine, Ser 16.30 (*)    Calcium 7.9 (*)    Albumin 3.3 (*)    GFR calc non Af Amer 2 (*)    GFR calc Af Amer 3 (*)    Anion gap 22 (*)    All other components within normal limits  I-STAT TROPONIN, ED  TYPE AND SCREEN    EKG EKG Interpretation  Date/Time:  Saturday December 18 2017 18:41:31 EST Ventricular Rate:  85 PR Interval:    QRS Duration: 99 QT Interval:  397 QTC Calculation: 473 R Axis:   69 Text Interpretation:  Sinus rhythm Borderline T abnormalities, anterior leads When compard to prior, no significant changes seen.  No STEMI Confirmed by Antony Blackbird [phone]) on 12/18/2017 6:56:05 PM   Radiology Dg Chest 2 View  Result Date: 12/18/2017 CLINICAL DATA:  Anemia.  Dialysis.  Shortness of breath. EXAM: CHEST - 2 VIEW COMPARISON:  10/27/2017 FINDINGS: The heart size and mediastinal contours  are within normal limits. Both lungs are clear. The visualized skeletal structures are unremarkable. IMPRESSION: No active cardiopulmonary disease. Electronically Signed   By: Van Clines M.D.   On: 12/18/2017 20:00    Procedures Procedures (including critical care time)  Medications Ordered in ED Medications - No data to display   Initial Impression / Assessment and Plan / ED Course  I have reviewed the triage vital signs and the nursing notes.  Pertinent labs & imaging results that were available during my care of the patient were reviewed by me and considered in my medical decision making (see chart for details).    42 year old with ERSD on home PD, anxiety, iron deficiency anemia presenting with DOE and exertional chest pain. Well appearing on exam with pale conjunctiva, stable vital signs. CBC demonstrates hemoglobin of 8.1. Trop negative at 0.01. CXR negative.   Patient ambulated in ED and had SOB, chest pain, and O2 desaturation to 73%. Given this would admit her for further work up and observation. Spoke with hospitalist on call for [facility] who agrees to admission. Will also notify nephrology as patient will need PD tonight. Patient verbalized understanding and agreement with plan.    Final Clinical Impressions(s) / ED Diagnoses   Final diagnoses:  Hypoxia    ED Discharge Orders    None       Steve Rattler, DO 12/18/17 2101    Tegeler, Gwenyth Allegra, MD 12/18/17 2217

## 2017-12-19 ENCOUNTER — Encounter (HOSPITAL_COMMUNITY): Payer: Self-pay | Admitting: General Practice

## 2017-12-19 ENCOUNTER — Other Ambulatory Visit: Payer: Self-pay

## 2017-12-19 DIAGNOSIS — D649 Anemia, unspecified: Secondary | ICD-10-CM

## 2017-12-19 DIAGNOSIS — I151 Hypertension secondary to other renal disorders: Secondary | ICD-10-CM | POA: Diagnosis not present

## 2017-12-19 DIAGNOSIS — F419 Anxiety disorder, unspecified: Secondary | ICD-10-CM | POA: Diagnosis not present

## 2017-12-19 DIAGNOSIS — E7800 Pure hypercholesterolemia, unspecified: Secondary | ICD-10-CM | POA: Diagnosis not present

## 2017-12-19 DIAGNOSIS — N2889 Other specified disorders of kidney and ureter: Secondary | ICD-10-CM

## 2017-12-19 LAB — TYPE AND SCREEN
ABO/RH(D): O NEG
ANTIBODY SCREEN: NEGATIVE
Unit division: 0

## 2017-12-19 LAB — RENAL FUNCTION PANEL
ALBUMIN: 3.2 g/dL — AB (ref 3.5–5.0)
Anion gap: 21 — ABNORMAL HIGH (ref 5–15)
BUN: 62 mg/dL — ABNORMAL HIGH (ref 6–20)
CO2: 22 mmol/L (ref 22–32)
Calcium: 7.9 mg/dL — ABNORMAL LOW (ref 8.9–10.3)
Chloride: 94 mmol/L — ABNORMAL LOW (ref 98–111)
Creatinine, Ser: 15.55 mg/dL — ABNORMAL HIGH (ref 0.44–1.00)
GFR calc Af Amer: 3 mL/min — ABNORMAL LOW (ref >60)
GFR calc non Af Amer: 2 mL/min — ABNORMAL LOW (ref >60)
GLUCOSE: 95 mg/dL (ref 70–99)
PHOSPHORUS: 4.8 mg/dL — AB (ref 2.5–4.6)
Potassium: 3.4 mmol/L — ABNORMAL LOW (ref 3.5–5.1)
Sodium: 137 mmol/L (ref 135–145)

## 2017-12-19 LAB — BPAM RBC
Blood Product Expiration Date: 201912162359
ISSUE DATE / TIME: 201912142327
UNIT TYPE AND RH: 9500

## 2017-12-19 LAB — CBC
HCT: 26.1 % — ABNORMAL LOW (ref 36.0–46.0)
Hemoglobin: 8.4 g/dL — ABNORMAL LOW (ref 12.0–15.0)
MCH: 29.1 pg (ref 26.0–34.0)
MCHC: 32.2 g/dL (ref 30.0–36.0)
MCV: 90.3 fL (ref 80.0–100.0)
Platelets: 264 10*3/uL (ref 150–400)
RBC: 2.89 MIL/uL — ABNORMAL LOW (ref 3.87–5.11)
RDW: 14.6 % (ref 11.5–15.5)
WBC: 7.3 10*3/uL (ref 4.0–10.5)
nRBC: 1.2 % — ABNORMAL HIGH (ref 0.0–0.2)

## 2017-12-19 LAB — FERRITIN: Ferritin: 918 ng/mL — ABNORMAL HIGH (ref 11–307)

## 2017-12-19 MED ORDER — POTASSIUM CHLORIDE CRYS ER 20 MEQ PO TBCR
40.0000 meq | EXTENDED_RELEASE_TABLET | Freq: Once | ORAL | Status: AC
  Administered 2017-12-19: 40 meq via ORAL
  Filled 2017-12-19: qty 2

## 2017-12-19 NOTE — Discharge Summary (Signed)
Physician Discharge Summary  [name]:[id number] DOB: Apr 05, 1975 DOA: 12/18/2017  PCP: Blair Heys, PA-C  Admit date: 12/18/2017 Discharge date: 12/19/2017  Time spent: 45 minutes  Recommendations for Outpatient Follow-up:  Patient will be discharged to home.  Patient will need to follow up with primary care provider within one week of discharge.  Follow up with nephrology, Dr. Jimmy Footman. Patient should continue medications as prescribed.  Patient should follow a renal diet.   Discharge Diagnoses:  Symptomatic anemia/anemia of chronic disease ESRD Mild hypokalemia Essential hypertension Hyperlipidemia Anxiety/depression  Discharge Condition: stable  Diet recommendation: renal diet  Filed Weights   12/18/17 2324 12/19/17 0830  Weight: 119.3 kg 118.2 kg    History of present illness:  On 12/18/2017 by Dr. Zada Finders Patricia [name] is a 42 y.o. female with medical history significant for ESRD on peritoneal dialysis, hypertension, hyperlipidemia, iron deficiency anemia, GERD, anemia who presents with about 2 days of exertional lightheadedness and dyspnea.  She says she saw her nephrologist, Dr. Jimmy Footman, last week and was noted to have a hemoglobin around 7.  She was offered a blood transfusion but declined at that time.  Over the last 2 days her symptoms persisted and she is becoming more lightheaded with exertion which would resolve with rest.  She reports fatigue and frequent craving for ice chips.  She reports chronic nausea which is unchanged without emesis.  She reports fair oral intake with soft/liquid diet which is also unchanged.  She reports regular bowel movements which are dark in discoloration which she attributes to her oral iron supplementation.  She says in the past she has had blood in the stool which she can generally tell by smell, which is not present at this time.  She denies any obvious bleeding.  She denies any fevers, diaphoresis, palpitations, or  peripheral edema.  Upper and lower endoscopies in August 2019 showed gastritis on EGD and congested friable colonic mucosa and polyps on colonoscopy.  She underwent a capsule endoscopy on 10/29/2017 which showed no source of anemia or active bleed.  Labs were notable for hemoglobin 8.1 (9.5 on 10/30/17 and reportedly ~7 last week at Nephrology clinic), WBC 7.0, platelets 306, K 3.3, BUN 64 (baseline ~50), Ph 3.5, i-STAT troponin 0.01.  Chest x-ray was without consolidation, infiltrate, pulmonary edema, or effusion.  EKG showed normal sinus rhythm with nonspecific T wave changes V2-V3, unchanged from prior.  Patient was noted to have oxygen desaturation on ambulation as low as mid 70s which improved upon sitting back down in bed.  Hospital Course:  Symptomatic anemia/anemia of chronic disease -Hemoglobin on admission was 8.1.  Upon review of patient's chart, baseline hemoglobin approximately 8-9. -Patient presented with lightheadedness, dyspnea and fatigue-which have not resolved -Patient was transfused 1 unit PRBC, hemoglobin currently 8.4 -Patient has had endoscopy as well as capsule endoscopy with no obvious source of GI bleeding. -Ferritin 918; currently on iron supplementation -Repeat CBC in 1 week  ESRD -On peritoneal dialysis.  Follows up with Dr. Jimmy Footman -Nephrology consulted and appreciated -Currently appears to be euvolemic  Mild hypokalemia -potassium 3.4, replaced  Essential hypertension -Resume home medication, Lopressor  Hyperlipidemia -Continue statin  Anxiety/depression -Continue Xanax as needed as well as Effexor or  Procedures: PD  Consultations: Nephrology  Discharge Exam: Vitals:   12/19/17 0932 12/19/17 0956  BP: (!) 146/89   Pulse: 81   Resp: 18   Temp: 97.7 F (36.5 C)   SpO2: 100% 100%   Patient states her breathing has  improved.  She denies current chest pain, shortness of breath, abdominal pain, nausea or vomiting, diarrhea constipation,  dizziness or headache.   General: Well developed, well nourished, NAD, appears stated age  HEENT: NCAT, mucous membranes moist.  Neck: Supple  Cardiovascular: S1 S2 auscultated, no rubs, murmurs or gallops. Regular rate and rhythm.  Respiratory: Clear to auscultation bilaterally with equal chest rise  Abdomen: Soft, obese, nontender, nondistended, + bowel sounds, PD catheter  Extremities: warm dry without cyanosis clubbing or edema  Neuro: AAOx3, nonfocal  Skin: Without rashes exudates or nodules  Psych: Normal affect and demeanor with intact judgement and insight  Discharge Instructions Discharge Instructions    Discharge instructions   Complete by:  As directed    Patient will be discharged to home.  Patient will need to follow up with primary care provider within one week of discharge.  Follow up with nephrology, Dr. Darrick Penna. Patient should continue medications as prescribed.  Patient should follow a renal diet.     Allergies as of 12/19/2017      Reactions   Nsaids Other (See Comments)   Cannot take due to Kidney disease, Kidney disease, Kidney function   Tolmetin Other (See Comments)   Cannot take due to Kidney Disease      Medication List    TAKE these medications   allopurinol 100 MG tablet Commonly known as:  ZYLOPRIM Take 100 mg by mouth at bedtime.   ALPRAZolam 1 MG tablet Commonly known as:  XANAX Take 1 mg by mouth 2 (two) times daily as needed for anxiety.   aspirin 81 MG chewable tablet Chew 1 tablet (81 mg total) by mouth daily. What changed:  when to take this   atorvastatin 40 MG tablet Commonly known as:  LIPITOR Take 40 mg by mouth at bedtime.   calcitRIOL 0.5 MCG capsule Commonly known as:  ROCALTROL Take 1 capsule (0.5 mcg total) by mouth daily with breakfast. What changed:  when to take this   cinacalcet 30 MG tablet Commonly known as:  SENSIPAR Take 1 tablet (30 mg total) by mouth daily.   diphenhydramine-acetaminophen 25-500  MG Tabs tablet Commonly known as:  TYLENOL PM Take 2 tablets by mouth at bedtime as needed (sleep).   metoprolol tartrate 25 MG tablet Commonly known as:  LOPRESSOR Take 25 mg by mouth every evening.   multivitamin Tabs tablet Take 1 tablet by mouth at bedtime.   pantoprazole 40 MG tablet Commonly known as:  PROTONIX Take 1 tablet (40 mg total) by mouth daily.   senna 8.6 MG Tabs tablet Commonly known as:  SENOKOT Take 2 tablets (17.2 mg total) by mouth at bedtime.   VELPHORO 500 MG chewable tablet Generic drug:  sucroferric oxyhydroxide Chew 1,000 mg by mouth 3 (three) times daily with meals.   venlafaxine XR 75 MG 24 hr capsule Commonly known as:  EFFEXOR-XR Take 75 mg by mouth at bedtime.      Allergies  Allergen Reactions  . Nsaids Other (See Comments)    Cannot take due to Kidney disease, Kidney disease, Kidney function   . Tolmetin Other (See Comments)    Cannot take due to Kidney Disease   Follow-up Information    [place], Morrie Sheldon, PA-C. Schedule an appointment as soon as possible for a visit in 1 week(s).   Specialty:  Physician Assistant Why:  Hospital follow up Contact information: [address] [place] [address] [phone]        Beryle Lathe, MD.  Schedule an appointment as soon as possible for a visit in 1 week(s).   Specialty:  Nephrology Why:  Hospital follow up Contact information: [place] [address] [phone]            The results of significant diagnostics from this hospitalization (including imaging, microbiology, ancillary and laboratory) are listed below for reference.    Significant Diagnostic Studies: Dg Chest 2 View  Result Date: 12/18/2017 CLINICAL DATA:  Anemia.  Dialysis.  Shortness of breath. EXAM: CHEST - 2 VIEW COMPARISON:  10/27/2017 FINDINGS: The heart size and mediastinal contours are within normal limits. Both lungs are clear. The visualized skeletal structures are unremarkable.  IMPRESSION: No active cardiopulmonary disease. Electronically Signed   By: Van Clines M.D.   On: 12/18/2017 20:00    Microbiology: No results found for this or any previous visit (from the past 240 hour(s)).   Labs: Basic Metabolic Panel: Recent Labs  Lab 12/18/17 1836 12/19/17 0553  NA 139 137  K 3.3* 3.4*  CL 94* 94*  CO2 23 22  GLUCOSE 113* 95  BUN 64* 62*  CREATININE 16.30* 15.55*  CALCIUM 7.9* 7.9*  PHOS 3.5 4.8*   Liver Function Tests: Recent Labs  Lab 12/18/17 1836 12/19/17 0553  ALBUMIN 3.3* 3.2*   No results for input(s): LIPASE, AMYLASE in the last 168 hours. No results for input(s): AMMONIA in the last 168 hours. CBC: Recent Labs  Lab 12/18/17 1836 12/19/17 0553  WBC 7.0 7.3  HGB 8.1* 8.4*  HCT 26.0* 26.1*  MCV 92.5 90.3  PLT 306 264   Cardiac Enzymes: No results for input(s): CKTOTAL, CKMB, CKMBINDEX, TROPONINI in the last 168 hours. BNP: BNP (last 3 results) No results for input(s): BNP in the last 8760 hours.  ProBNP (last 3 results) No results for input(s): PROBNP in the last 8760 hours.  CBG: No results for input(s): GLUCAP in the last 168 hours.     Signed:  Cristal Ford  [facility] 12/19/2017, 12:28 PM

## 2017-12-19 NOTE — Discharge Instructions (Signed)
Anemia Anemia is a condition in which you do not have enough red blood cells or hemoglobin. Hemoglobin is a substance in red blood cells that carries oxygen. When you do not have enough red blood cells or hemoglobin (are anemic), your body cannot get enough oxygen and your organs may not work properly. As a result, you may feel very tired or have other problems. What are the causes? Common causes of anemia include:  Excessive bleeding. Anemia can be caused by excessive bleeding inside or outside the body, including bleeding from the intestine or from periods in women.  Poor nutrition.  Long-lasting (chronic) kidney, thyroid, and liver disease.  Bone marrow disorders.  Cancer and treatments for cancer.  HIV (human immunodeficiency virus) and AIDS (acquired immunodeficiency syndrome).  Treatments for HIV and AIDS.  Spleen problems.  Blood disorders.  Infections, medicines, and autoimmune disorders that destroy red blood cells.  What are the signs or symptoms? Symptoms of this condition include:  Minor weakness.  Dizziness.  Headache.  Feeling heartbeats that are irregular or faster than normal (palpitations).  Shortness of breath, especially with exercise.  Paleness.  Cold sensitivity.  Indigestion.  Nausea.  Difficulty sleeping.  Difficulty concentrating.  Symptoms may occur suddenly or develop slowly. If your anemia is mild, you may not have symptoms. How is this diagnosed? This condition is diagnosed based on:  Blood tests.  Your medical history.  A physical exam.  Bone marrow biopsy.  Your health care provider may also check your stool (feces) for blood and may do additional testing to look for the cause of your bleeding. You may also have other tests, including:  Imaging tests, such as a CT scan or MRI.  Endoscopy.  Colonoscopy.  How is this treated? Treatment for this condition depends on the cause. If you continue to lose a lot of blood,  you may need to be treated at a hospital. Treatment may include:  Taking supplements of iron, vitamin T02, or folic acid.  Taking a hormone medicine (erythropoietin) that can help to stimulate red blood cell growth.  Having a blood transfusion. This may be needed if you lose a lot of blood.  Making changes to your diet.  Having surgery to remove your spleen.  Follow these instructions at home:  Take over-the-counter and prescription medicines only as told by your health care provider.  Take supplements only as told by your health care provider.  Follow any diet instructions that you were given.  Keep all follow-up visits as told by your health care provider. This is important. Contact a health care provider if:  You develop new bleeding anywhere in the body. Get help right away if:  You are very weak.  You are short of breath.  You have pain in your abdomen or chest.  You are dizzy or feel faint.  You have trouble concentrating.  You have bloody or black, tarry stools.  You vomit repeatedly or you vomit up blood. Summary  Anemia is a condition in which you do not have enough red blood cells or enough of a substance in your red blood cells that carries oxygen (hemoglobin).  Symptoms may occur suddenly or develop slowly.  If your anemia is mild, you may not have symptoms.  This condition is diagnosed with blood tests as well as a medical history and physical exam. Other tests may be needed.  Treatment for this condition depends on the cause of the anemia. This information is not intended to replace advice  given to you by your health care provider. Make sure you discuss any questions you have with your health care provider. °Document Released: 01/30/2004 Document Revised: 01/24/2016 Document Reviewed: 01/24/2016 °Elsevier Interactive Patient Education © 2018 Elsevier Inc. ° °

## 2017-12-19 NOTE — Progress Notes (Signed)
Patricia [name] to be D/C'd Home per MD order.  Discussed prescriptions and follow up appointments with the patient. Prescriptions given to patient, medication list explained in detail. Pt verbalized understanding.  Allergies as of 12/19/2017      Reactions   Nsaids Other (See Comments)   Cannot take due to Kidney disease, Kidney disease, Kidney function   Tolmetin Other (See Comments)   Cannot take due to Kidney Disease      Medication List    TAKE these medications   allopurinol 100 MG tablet Commonly known as:  ZYLOPRIM Take 100 mg by mouth at bedtime.   ALPRAZolam 1 MG tablet Commonly known as:  XANAX Take 1 mg by mouth 2 (two) times daily as needed for anxiety.   aspirin 81 MG chewable tablet Chew 1 tablet (81 mg total) by mouth daily. What changed:  when to take this   atorvastatin 40 MG tablet Commonly known as:  LIPITOR Take 40 mg by mouth at bedtime.   calcitRIOL 0.5 MCG capsule Commonly known as:  ROCALTROL Take 1 capsule (0.5 mcg total) by mouth daily with breakfast. What changed:  when to take this   cinacalcet 30 MG tablet Commonly known as:  SENSIPAR Take 1 tablet (30 mg total) by mouth daily.   diphenhydramine-acetaminophen 25-500 MG Tabs tablet Commonly known as:  TYLENOL PM Take 2 tablets by mouth at bedtime as needed (sleep).   metoprolol tartrate 25 MG tablet Commonly known as:  LOPRESSOR Take 25 mg by mouth every evening.   multivitamin Tabs tablet Take 1 tablet by mouth at bedtime.   pantoprazole 40 MG tablet Commonly known as:  PROTONIX Take 1 tablet (40 mg total) by mouth daily.   senna 8.6 MG Tabs tablet Commonly known as:  SENOKOT Take 2 tablets (17.2 mg total) by mouth at bedtime.   VELPHORO 500 MG chewable tablet Generic drug:  sucroferric oxyhydroxide Chew 1,000 mg by mouth 3 (three) times daily with meals.   venlafaxine XR 75 MG 24 hr capsule Commonly known as:  EFFEXOR-XR Take 75 mg by mouth at bedtime.       Vitals:   12/19/17 0932 12/19/17 0956  BP: (!) 146/89   Pulse: 81   Resp: 18   Temp: 97.7 F (36.5 C)   SpO2: 100% 100%    Skin clean, dry and intact without evidence of skin break down, no evidence of skin tears noted. IV catheter discontinued intact. Site without signs and symptoms of complications. Dressing and pressure applied. Pt denies pain at this time. No complaints noted.  An After Visit Summary was printed and given to the patient. Patient escorted via [place], and D/C home via private auto.  Orville Govern, RN

## 2017-12-19 NOTE — Progress Notes (Signed)
Pt seen and examined.  Looks euvolemic on exam, BP's slightly high.  No indication to change PD prescription at this time, would cont same BP meds for now.  Feeling better after 1u prbc's yest.  Recently received her ESA in OP PD clinic. She has appt w/ her renal MD this week.    Kelly Splinter MD [employer]   12/19/2017, 2:46 PM

## 2017-12-19 NOTE — Progress Notes (Signed)
PD tx initiated via [place] w/o problem VSS Report given to Tina Issacs, RN 

## 2017-12-19 NOTE — Progress Notes (Signed)
Patient ambulated in hallway and no complaints of SOB, dizziness or fainting. sats 100% on room air.

## 2017-12-20 LAB — HEPATITIS B SURFACE ANTIGEN: Hepatitis B Surface Ag: NEGATIVE

## 2017-12-22 ENCOUNTER — Encounter (HOSPITAL_COMMUNITY): Payer: Self-pay | Admitting: Emergency Medicine

## 2017-12-22 ENCOUNTER — Other Ambulatory Visit: Payer: Self-pay

## 2017-12-22 ENCOUNTER — Observation Stay (HOSPITAL_COMMUNITY)
Admission: EM | Admit: 2017-12-22 | Discharge: 2017-12-25 | Disposition: A | Discharge status: Home / Self Care | Payer: Medicare Other | Attending: Internal Medicine | Admitting: Internal Medicine

## 2017-12-22 DIAGNOSIS — K228 Other specified diseases of esophagus: Secondary | ICD-10-CM | POA: Diagnosis not present

## 2017-12-22 DIAGNOSIS — E8889 Other specified metabolic disorders: Secondary | ICD-10-CM | POA: Insufficient documentation

## 2017-12-22 DIAGNOSIS — K649 Unspecified hemorrhoids: Secondary | ICD-10-CM | POA: Diagnosis present

## 2017-12-22 DIAGNOSIS — Z992 Dependence on renal dialysis: Secondary | ICD-10-CM

## 2017-12-22 DIAGNOSIS — Z886 Allergy status to analgesic agent status: Secondary | ICD-10-CM | POA: Insufficient documentation

## 2017-12-22 DIAGNOSIS — F419 Anxiety disorder, unspecified: Secondary | ICD-10-CM | POA: Insufficient documentation

## 2017-12-22 DIAGNOSIS — K219 Gastro-esophageal reflux disease without esophagitis: Secondary | ICD-10-CM | POA: Diagnosis not present

## 2017-12-22 DIAGNOSIS — E7800 Pure hypercholesterolemia, unspecified: Secondary | ICD-10-CM | POA: Insufficient documentation

## 2017-12-22 DIAGNOSIS — N186 End stage renal disease: Secondary | ICD-10-CM | POA: Diagnosis not present

## 2017-12-22 DIAGNOSIS — D631 Anemia in chronic kidney disease: Secondary | ICD-10-CM | POA: Diagnosis not present

## 2017-12-22 DIAGNOSIS — K3189 Other diseases of stomach and duodenum: Secondary | ICD-10-CM | POA: Insufficient documentation

## 2017-12-22 DIAGNOSIS — D649 Anemia, unspecified: Secondary | ICD-10-CM | POA: Diagnosis present

## 2017-12-22 DIAGNOSIS — I471 Supraventricular tachycardia: Secondary | ICD-10-CM | POA: Insufficient documentation

## 2017-12-22 DIAGNOSIS — Z79899 Other long term (current) drug therapy: Secondary | ICD-10-CM | POA: Diagnosis not present

## 2017-12-22 DIAGNOSIS — E785 Hyperlipidemia, unspecified: Secondary | ICD-10-CM | POA: Diagnosis not present

## 2017-12-22 DIAGNOSIS — K254 Chronic or unspecified gastric ulcer with hemorrhage: Secondary | ICD-10-CM | POA: Diagnosis not present

## 2017-12-22 DIAGNOSIS — I4719 Other supraventricular tachycardia: Secondary | ICD-10-CM | POA: Diagnosis present

## 2017-12-22 DIAGNOSIS — K922 Gastrointestinal hemorrhage, unspecified: Secondary | ICD-10-CM | POA: Diagnosis present

## 2017-12-22 DIAGNOSIS — Z7982 Long term (current) use of aspirin: Secondary | ICD-10-CM | POA: Diagnosis not present

## 2017-12-22 DIAGNOSIS — D62 Acute posthemorrhagic anemia: Secondary | ICD-10-CM | POA: Diagnosis not present

## 2017-12-22 DIAGNOSIS — Z6841 Body Mass Index (BMI) 40.0 and over, adult: Secondary | ICD-10-CM | POA: Insufficient documentation

## 2017-12-22 DIAGNOSIS — I10 Essential (primary) hypertension: Secondary | ICD-10-CM | POA: Diagnosis present

## 2017-12-22 DIAGNOSIS — M109 Gout, unspecified: Secondary | ICD-10-CM | POA: Insufficient documentation

## 2017-12-22 DIAGNOSIS — N2581 Secondary hyperparathyroidism of renal origin: Secondary | ICD-10-CM | POA: Diagnosis not present

## 2017-12-22 DIAGNOSIS — I120 Hypertensive chronic kidney disease with stage 5 chronic kidney disease or end stage renal disease: Secondary | ICD-10-CM | POA: Diagnosis not present

## 2017-12-22 HISTORY — DX: Gastrointestinal hemorrhage, unspecified: K92.2

## 2017-12-22 LAB — COMPREHENSIVE METABOLIC PANEL
ALT: 20 U/L (ref 0–44)
AST: 29 U/L (ref 15–41)
Albumin: 3.5 g/dL (ref 3.5–5.0)
Alkaline Phosphatase: 65 U/L (ref 38–126)
Anion gap: 22 — ABNORMAL HIGH (ref 5–15)
BUN: 66 mg/dL — ABNORMAL HIGH (ref 6–20)
CHLORIDE: 101 mmol/L (ref 98–111)
CO2: 19 mmol/L — AB (ref 22–32)
Calcium: 8 mg/dL — ABNORMAL LOW (ref 8.9–10.3)
Creatinine, Ser: 17.18 mg/dL — ABNORMAL HIGH (ref 0.44–1.00)
GFR calc Af Amer: 3 mL/min — ABNORMAL LOW (ref >60)
GFR calc non Af Amer: 2 mL/min — ABNORMAL LOW (ref >60)
Glucose, Bld: 88 mg/dL (ref 70–99)
Potassium: 4.2 mmol/L (ref 3.5–5.1)
SODIUM: 142 mmol/L (ref 135–145)
Total Bilirubin: 0.7 mg/dL (ref 0.3–1.2)
Total Protein: 7 g/dL (ref 6.5–8.1)

## 2017-12-22 LAB — CBC WITH DIFFERENTIAL/PLATELET
Abs Immature Granulocytes: 0.1 10*3/uL — ABNORMAL HIGH (ref 0.00–0.07)
Basophils Absolute: 0 10*3/uL (ref 0.0–0.1)
Basophils Relative: 1 %
Eosinophils Absolute: 0.1 10*3/uL (ref 0.0–0.5)
Eosinophils Relative: 2 %
HCT: 25.2 % — ABNORMAL LOW (ref 36.0–46.0)
HEMOGLOBIN: 8.1 g/dL — AB (ref 12.0–15.0)
Immature Granulocytes: 2 %
LYMPHS PCT: 31 %
Lymphs Abs: 1.9 10*3/uL (ref 0.7–4.0)
MCH: 29.9 pg (ref 26.0–34.0)
MCHC: 32.1 g/dL (ref 30.0–36.0)
MCV: 93 fL (ref 80.0–100.0)
Monocytes Absolute: 0.5 10*3/uL (ref 0.1–1.0)
Monocytes Relative: 8 %
Neutro Abs: 3.6 10*3/uL (ref 1.7–7.7)
Neutrophils Relative %: 56 %
Platelets: 267 10*3/uL (ref 150–400)
RBC: 2.71 MIL/uL — ABNORMAL LOW (ref 3.87–5.11)
RDW: 15.6 % — ABNORMAL HIGH (ref 11.5–15.5)
WBC: 6.3 10*3/uL (ref 4.0–10.5)
nRBC: 1.9 % — ABNORMAL HIGH (ref 0.0–0.2)

## 2017-12-22 LAB — POC OCCULT BLOOD, ED: Fecal Occult Bld: POSITIVE — AB

## 2017-12-22 LAB — PROTIME-INR
INR: 0.98
Prothrombin Time: 12.9 seconds (ref 11.4–15.2)

## 2017-12-22 LAB — I-STAT BETA HCG BLOOD, ED (MC, WL, AP ONLY): I-stat hCG, quantitative: <5 m[IU]/mL (ref <5)

## 2017-12-22 MED ORDER — PANTOPRAZOLE SODIUM 40 MG IV SOLR
40.0000 mg | Freq: Once | INTRAVENOUS | Status: AC
  Administered 2017-12-22: 40 mg via INTRAVENOUS
  Filled 2017-12-22: qty 40

## 2017-12-22 NOTE — ED Notes (Signed)
ED Provider at bedside. 

## 2017-12-22 NOTE — ED Notes (Signed)
IV team at bedside 

## 2017-12-22 NOTE — ED Provider Notes (Signed)
[facility] EMERGENCY DEPARTMENT Provider Note   CSN: [id number] Arrival date & time: 12/22/17  1918     History   Chief Complaint Chief Complaint  Patient presents with  . Rectal Bleeding    HPI Patricia [name] is a 41 y.o. female.  The history is provided by the patient.  GI Problem  This is a recurrent problem. The current episode started yesterday. The problem occurs constantly. The problem has not changed since onset.Pertinent negatives include no chest pain, no abdominal pain, no headaches and no shortness of breath. Associated symptoms comments: Patient noticed black stools the last two days. Recent admission for the same and unable to locate bleed. Patient has hx of ESRD on iron. Takes PPI. Follow with [facility]. States feels more fatigued than usual. . Nothing aggravates the symptoms. Nothing relieves the symptoms. She has tried a cold compress for the symptoms. The treatment provided no relief.    Past Medical History:  Diagnosis Date  . Acid reflux   . Anxiety   . Arthritis   . Dyspnea   . Dysrhythmia    tachycardia  . ESRD (end stage renal disease) ([place])    TTHSAT- [place]  . Gout   . Headache(784.0)    "related to chemo; sometimes weekly" (09/12/2013)  . High cholesterol   . History of blood transfusion "a couple"   "related to low counts"  . Hypertension   . Iron deficiency anemia    "get epogen shots q month" (02/20/2014)  . 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 Peritoneal dialysis status [facility])     Patient Active Problem List   Diagnosis Date Noted  . Symptomatic anemia 10/28/2017  . Esophageal candidiasis ([place]) 09/01/2017  . Anxiety 09/01/2017  . Stroke ([place]) 08/15/2017  . Dysphagia 08/15/2017  . Melena 06/03/2017  . Abdominal pain 06/03/2017  . Dialysis AV fistula malfunction ([place]) 04/04/2017  . ESRD on peritoneal dialysis ([place]) 04/01/2016  . Anemia due to  chronic kidney disease 04/01/2016  . GERD (gastroesophageal reflux disease) 04/01/2016  . Hypomagnesemia 04/28/2015  . Acute renal failure superimposed on stage 3 chronic kidney disease ([place]) 04/12/2014  . Atypical chest pain   . Chest pain 02/19/2014  . Hypokalemia 02/19/2014  . Nephrotic syndrome 10/02/2013  . Dyslipidemia 10/02/2013  . PAT (paroxysmal atrial tachycardia) ([place]) 09/29/2013  . Normocytic anemia 06/29/2013  . Edema 06/29/2013  . Nausea and vomiting 06/29/2013  . Fever, unspecified 01/31/2013  . Glomerulonephritis 01/28/2013  . Syncope 01/28/2013  . Shortness of breath 07/04/2011  . Morbid obesity ([place]) 07/04/2011  . HTN (hypertension) 07/04/2011  . Hypercholesterolemia 07/04/2011    Past Surgical History:  Procedure Laterality Date  . ABDOMINAL HYSTERECTOMY  2010   "laparoscopic"  . ANKLE FRACTURE SURGERY Right 1994  . AV FISTULA PLACEMENT Left 04/14/2016   Procedure: ARTERIOVENOUS (AV) FISTULA CREATION;  Surgeon: Angelia Mould, MD;  Location: [facility];  Service: Vascular;  Laterality: Left;  . AV FISTULA PLACEMENT Left 08/09/2017   Procedure: INSERTION OF ARTERIOVENOUS (AV) GORE-TEX GRAFT LEFT ARM;  Surgeon: Elam Dutch, MD;  Location: [place];  Service: Vascular;  Laterality: Left;  . [place] REMOVAL Left 08/11/2017   Procedure: REMOVAL OF ARTERIOVENOUS GORETEX GRAFT ([place]);  Surgeon: Serafina Mitchell, MD;  Location: [facility] OR;  Service: Vascular;  Laterality: Left;  . BIOPSY  09/03/2017   Procedure: BIOPSY;  Surgeon: Otis Brace, MD;  Location: [facility] ENDOSCOPY;  Service: Gastroenterology;;  . CARDIAC CATHETERIZATION  2000's  . COLONOSCOPY WITH PROPOFOL N/A 09/04/2017   Procedure: COLONOSCOPY WITH PROPOFOL;  Surgeon: Ronnette Juniper, MD;  Location: [place];  Service: Gastroenterology;  Laterality: N/A;  . ESOPHAGOGASTRODUODENOSCOPY    . ESOPHAGOGASTRODUODENOSCOPY (EGD) WITH PROPOFOL Left 06/04/2017   Procedure: ESOPHAGOGASTRODUODENOSCOPY (EGD) WITH PROPOFOL;   Surgeon: Arta Silence, MD;  Location: [place];  Service: Endoscopy;  Laterality: Left;  . ESOPHAGOGASTRODUODENOSCOPY (EGD) WITH PROPOFOL N/A 09/03/2017   Procedure: ESOPHAGOGASTRODUODENOSCOPY (EGD) WITH PROPOFOL;  Surgeon: Otis Brace, MD;  Location: [facility];  Service: Gastroenterology;  Laterality: N/A;  . FISTULA SUPERFICIALIZATION Left 04/02/2017   Procedure: FISTULA SUPERFICIALIZATION LEFT ARM;  Surgeon: Serafina Mitchell, MD;  Location: [place];  Service: Vascular;  Laterality: Left;  . FISTULOGRAM Left 04/07/2016   Procedure: FISTULOGRAM;  Surgeon: Waynetta Sandy, MD;  Location: [place];  Service: Vascular;  Laterality: Left;  . FRACTURE SURGERY     right ankle  . GIVENS CAPSULE STUDY N/A 10/29/2017   Procedure: GIVENS CAPSULE STUDY;  Surgeon: Wilford Corner, MD;  Location: [place];  Service: Endoscopy;  Laterality: N/A;  . INSERTION OF DIALYSIS CATHETER Right 04/07/2016   Procedure: INSERTION OF DIALYSIS CATHETER;  Surgeon: Waynetta Sandy, MD;  Location: [place];  Service: Vascular;  Laterality: Right;  . INSERTION OF DIALYSIS CATHETER Right 04/04/2017   Procedure: INSERTION OF DIALYSIS CATHETER;  Surgeon: Waynetta Sandy, MD;  Location: [place];  Service: Vascular;  Laterality: Right;  . LIGATION OF ARTERIOVENOUS  FISTULA Left 04/14/2016   Procedure: LIGATION OF ARTERIOVENOUS  FISTULA;  Surgeon: Angelia Mould, MD;  Location: [place];  Service: Vascular;  Laterality: Left;  . PATCH ANGIOPLASTY Left 06/19/2016   Procedure: PATCH ANGIOPLASTY LEFT ARTERIOVENOUS FISTULA;  Surgeon: Angelia Mould, MD;  Location: [place];  Service: Vascular;  Laterality: Left;  . POLYPECTOMY  09/04/2017   Procedure: POLYPECTOMY;  Surgeon: Ronnette Juniper, MD;  Location: [facility] ENDOSCOPY;  Service: Gastroenterology;;  . REVISON OF ARTERIOVENOUS FISTULA Left 06/19/2016   Procedure: REVISION SUPERFICIALIZATION OF BRACHIOCEPHALIC ARTERIOVENOUS FISTULA;  Surgeon: Angelia Mould, MD;  Location: [facility] OR;  Service: Vascular;  Laterality: Left;     OB History    Gravida  3   Para      Term      Preterm      AB      Living        SAB      TAB      Ectopic      Multiple  3   Live Births               Home Medications    Prior to Admission medications   Medication Sig Start Date End Date Taking? Authorizing Provider  allopurinol (ZYLOPRIM) 100 MG tablet Take 100 mg by mouth at bedtime.    Yes [provider]  ALPRAZolam Duanne Moron) 1 MG tablet Take 1 mg by mouth 2 (two) times daily as needed for anxiety.  03/19/17  Yes [provider]  aspirin 81 MG chewable tablet Chew 1 tablet (81 mg total) by mouth daily. Patient taking differently: Chew 81 mg by mouth at bedtime.  10/03/13  Yes Eugenie Filler, MD  atorvastatin (LIPITOR) 40 MG tablet Take 40 mg by mouth at bedtime.  03/14/14  Yes [provider]  calcitRIOL (ROCALTROL) 0.5 MCG capsule Take 1 capsule (0.5 mcg total) by mouth daily with breakfast. Patient  taking differently: Take 0.5 mcg by mouth at bedtime.  06/04/17  Yes Bonnielee Haff, MD  cinacalcet (SENSIPAR) 30 MG tablet Take 1 tablet (30 mg total) by mouth daily. Patient taking differently: Take 30 mg by mouth at bedtime.  08/19/17  Yes Shelly Coss, MD  diphenhydramine-acetaminophen (TYLENOL PM) 25-500 MG TABS tablet Take 2 tablets by mouth at bedtime as needed (sleep).    Yes [provider]  metoprolol tartrate (LOPRESSOR) 25 MG tablet Take 25 mg by mouth at bedtime.  04/17/16  Yes [provider]  multivitamin (RENA-VIT) TABS tablet Take 1 tablet by mouth at bedtime.    Yes [provider]  ondansetron (ZOFRAN) 8 MG tablet Take 8 mg by mouth every 8 (eight) hours as needed for nausea or vomiting.   Yes [provider]  pantoprazole (PROTONIX) 40 MG tablet Take 1 tablet (40 mg total) by mouth daily. Patient taking differently: Take 40 mg by mouth at bedtime.   09/04/17  Yes Shelly Coss, MD  promethazine (PHENERGAN) 25 MG tablet Take 25 mg by mouth every 6 (six) hours as needed for nausea or vomiting.   Yes [provider]  senna (SENOKOT) 8.6 MG TABS tablet Take 2 tablets (17.2 mg total) by mouth at bedtime. 04/15/16  Yes Nita Sells, MD  sucroferric oxyhydroxide (VELPHORO) 500 MG chewable tablet Chew 1,000 mg by mouth 3 (three) times daily with meals.    Yes [provider]  venlafaxine XR (EFFEXOR-XR) 75 MG 24 hr capsule Take 75 mg by mouth at bedtime.    Yes [provider]    Family History Family History  Problem Relation Age of Onset  . Hypertension Mother   . Thyroid disease Mother   . Coronary artery disease Father   . Hypertension Father   . Diabetes Father     Social History Social History   Tobacco Use  . Smoking status: Never Smoker  . Smokeless tobacco: Never Used  Substance Use Topics  . Alcohol use: No  . Drug use: No     Allergies   Nsaids and Tolmetin   Review of Systems Review of Systems  Constitutional: Positive for fatigue. Negative for chills and fever.  HENT: Negative for ear pain and sore throat.   Eyes: Negative for pain and visual disturbance.  Respiratory: Negative for cough and shortness of breath.   Cardiovascular: Negative for chest pain and palpitations.  Gastrointestinal: Positive for blood in stool. Negative for abdominal distention, abdominal pain, anal bleeding, constipation, diarrhea, nausea, rectal pain and vomiting.  Genitourinary: Negative for dysuria and hematuria.  Musculoskeletal: Negative for arthralgias and back pain.  Skin: Negative for color change and rash.  Neurological: Negative for seizures, syncope and headaches.  All other systems reviewed and are negative.    Physical Exam Updated Vital Signs  ED Triage Vitals  Enc Vitals Group     BP 12/22/17 1931 (!) 148/85     Pulse Rate 12/22/17 1931 78     Resp 12/22/17 1931 16     Temp  12/22/17 1931 97.8 F (36.6 C)     Temp Source 12/22/17 1931 Oral     SpO2 12/22/17 1931 99 %     Weight 12/22/17 1939 254 lb (115.2 kg)     Height 12/22/17 1939 $RemoveBefor'5\' 7"'[id number]$  (1.702 m)     Head Circumference --      Peak Flow --      Pain Score 12/22/17 1939 4     Pain Loc --  Pain Edu? --      Excl. in [place]? --     Physical Exam Vitals signs and nursing note reviewed.  Constitutional:      General: She is not in acute distress.    Appearance: She is well-developed.  HENT:     Head: Normocephalic and atraumatic.     Nose: Nose normal.     Mouth/Throat:     Mouth: Mucous membranes are moist.  Eyes:     Extraocular Movements: Extraocular movements intact.     Conjunctiva/sclera: Conjunctivae normal.     Pupils: Pupils are equal, round, and reactive to light.  Neck:     Musculoskeletal: Normal range of motion and neck supple.  Cardiovascular:     Rate and Rhythm: Normal rate and regular rhythm.     Pulses: Normal pulses.     Heart sounds: Normal heart sounds. No murmur.  Pulmonary:     Effort: Pulmonary effort is normal. No respiratory distress.     Breath sounds: Normal breath sounds.  Abdominal:     General: There is no distension.     Palpations: Abdomen is soft.     Tenderness: There is no abdominal tenderness.  Genitourinary:    Rectum: Guaiac result positive (melena on exam).     Comments: Hemorrhoid with some red blood but also melena Musculoskeletal: Normal range of motion.  Skin:    General: Skin is warm and dry.     Capillary Refill: Capillary refill takes less than 2 seconds.  Neurological:     General: No focal deficit present.     Mental Status: She is alert.  Psychiatric:        Mood and Affect: Mood normal.      ED Treatments / Results  Labs (all labs ordered are listed, but only abnormal results are displayed) Labs Reviewed  COMPREHENSIVE METABOLIC PANEL - Abnormal; Notable for the following components:      Result Value   CO2 19 (*)    BUN 66  (*)    Creatinine, Ser 17.18 (*)    Calcium 8.0 (*)    GFR calc non Af Amer 2 (*)    GFR calc Af Amer 3 (*)    Anion gap 22 (*)    All other components within normal limits  CBC WITH DIFFERENTIAL/PLATELET - Abnormal; Notable for the following components:   RBC 2.71 (*)    Hemoglobin 8.1 (*)    HCT 25.2 (*)    RDW 15.6 (*)    nRBC 1.9 (*)    Abs Immature Granulocytes 0.10 (*)    All other components within normal limits  POC OCCULT BLOOD, ED - Abnormal; Notable for the following components:   Fecal Occult Bld POSITIVE (*)    All other components within normal limits  PROTIME-INR  CBC WITH DIFFERENTIAL/PLATELET  CBC  I-STAT BETA HCG BLOOD, ED ([facility], [facility], [facility] ONLY)  TYPE AND SCREEN    EKG None  Radiology No results found.  Procedures Procedures (including critical care time)  Medications Ordered in ED Medications  pantoprazole (PROTONIX) injection 40 mg (40 mg Intravenous Given 12/22/17 2134)     Initial Impression / Assessment and Plan / ED Course  I have reviewed the triage vital signs and the nursing notes.  Pertinent labs & imaging results that were available during my care of the patient were reviewed by me and considered in my medical decision making (see chart for details).     Patricia [name] is a  42 year old female with history of anxiety, end-stage renal disease on dialysis, hypertension, anemia who presents to the ED with concern for GI bleed.  Patient with normal vitals.  No fever.  Patient states that she noticed some black stool today.  She has had some bright red blood as well.  Patient denies any abdominal pain.  She had recent admission last month where she had EGD, colonoscopy, small capsule follow-through without any obvious GI bleed.  GI notes show some duodenitis.  GI note states that patient likely with chronic anemia needs follow-up with hematologist.  Patient has not been able to follow-up with them at this point.  Patient has some melena mixed with  bright red stool, hemorrhoids.  Patient otherwise unremarkable exam.  Patient given IV Protonix.  Lab work ordered.  Will reevaluate.  Patient with hemoglobin at 8.1.  Hemoglobin 2 days ago was 8.4.  She was admitted 2 days ago with similar concern. She was transfused 1 unit of blood for symptomatic anemia.  Hemoglobin appears stable.  She does not have any chest pain, shortness of breath.  She is overall well-appearing.  Creatinine at baseline and otherwise no significant electrolyte abnormalities.  Talked with Dr. Hal Hope with hospitalist service and together we made the decision to order repeat hemoglobin at 4 hours.  Lower concern that this is due to GI bleed given recent work-ups.  Patient has multiple reasons for black stool including that patient is on iron, does have hemorrhoids.  Patient with overall stable hemoglobin.  Hemodynamically stable. If there is a significant change patient to be admitted but otherwise she can follow-up outpatient for repeat CBC with primary care doctor.  Patient was signed out to oncoming ED staff with patient pending repeat hemoglobin.  Patient is okay with this plan.  She was given return precautions if she does indeed go home.  Please see oncoming ED provider's note for further results, evaluation, disposition.  This chart was dictated using voice recognition software.  Despite best efforts to proofread,  errors can occur which can change the documentation meaning.   Final Clinical Impressions(s) / ED Diagnoses   Final diagnoses:  Hemorrhoids, unspecified hemorrhoid type    ED Discharge Orders    None       Lennice Sites, DO 12/22/17 2356

## 2017-12-22 NOTE — ED Triage Notes (Signed)
Patient hx of GI bleed, c/o dark stool yesterday with brighter blood today. Patient reports abdominal pain and weakness. Patient alert and orientated x4. Does peritoneal dialysis at home.

## 2017-12-23 ENCOUNTER — Encounter (HOSPITAL_COMMUNITY): Payer: Self-pay | Admitting: Internal Medicine

## 2017-12-23 ENCOUNTER — Observation Stay (HOSPITAL_COMMUNITY): Payer: Medicare Other

## 2017-12-23 DIAGNOSIS — D649 Anemia, unspecified: Secondary | ICD-10-CM

## 2017-12-23 DIAGNOSIS — N186 End stage renal disease: Secondary | ICD-10-CM

## 2017-12-23 DIAGNOSIS — R195 Other fecal abnormalities: Secondary | ICD-10-CM | POA: Diagnosis not present

## 2017-12-23 DIAGNOSIS — Z992 Dependence on renal dialysis: Secondary | ICD-10-CM

## 2017-12-23 DIAGNOSIS — K922 Gastrointestinal hemorrhage, unspecified: Secondary | ICD-10-CM | POA: Diagnosis not present

## 2017-12-23 LAB — BASIC METABOLIC PANEL
ANION GAP: 20 — AB (ref 5–15)
BUN: 70 mg/dL — ABNORMAL HIGH (ref 6–20)
CO2: 19 mmol/L — ABNORMAL LOW (ref 22–32)
Calcium: 7.6 mg/dL — ABNORMAL LOW (ref 8.9–10.3)
Chloride: 99 mmol/L (ref 98–111)
Creatinine, Ser: 17.52 mg/dL — ABNORMAL HIGH (ref 0.44–1.00)
GFR calc Af Amer: 2 mL/min — ABNORMAL LOW (ref >60)
GFR calc non Af Amer: 2 mL/min — ABNORMAL LOW (ref >60)
Glucose, Bld: 89 mg/dL (ref 70–99)
Potassium: 4.2 mmol/L (ref 3.5–5.1)
Sodium: 138 mmol/L (ref 135–145)

## 2017-12-23 LAB — CBC
HCT: 24.2 % — ABNORMAL LOW (ref 36.0–46.0)
HCT: 29 % — ABNORMAL LOW (ref 36.0–46.0)
Hemoglobin: 7.6 g/dL — ABNORMAL LOW (ref 12.0–15.0)
Hemoglobin: 9.2 g/dL — ABNORMAL LOW (ref 12.0–15.0)
MCH: 29 pg (ref 26.0–34.0)
MCH: 28.8 pg (ref 26.0–34.0)
MCHC: 31.4 g/dL (ref 30.0–36.0)
MCHC: 31.7 g/dL (ref 30.0–36.0)
MCV: 92.4 fL (ref 80.0–100.0)
MCV: 90.6 fL (ref 80.0–100.0)
Platelets: 230 10*3/uL (ref 150–400)
Platelets: 215 10*3/uL (ref 150–400)
RBC: 3.2 MIL/uL — AB (ref 3.87–5.11)
RBC: 2.62 MIL/uL — AB (ref 3.87–5.11)
RDW: 15.3 % (ref 11.5–15.5)
RDW: 14.9 % (ref 11.5–15.5)
WBC: 6.8 10*3/uL (ref 4.0–10.5)
WBC: 6.4 10*3/uL (ref 4.0–10.5)
nRBC: 1.3 % — ABNORMAL HIGH (ref 0.0–0.2)
nRBC: 1.6 % — ABNORMAL HIGH (ref 0.0–0.2)

## 2017-12-23 LAB — PREPARE RBC (CROSSMATCH)

## 2017-12-23 MED ORDER — DIPHENHYDRAMINE HCL 50 MG/ML IJ SOLN
12.5000 mg | Freq: Once | INTRAMUSCULAR | Status: AC
  Administered 2017-12-23: 12.5 mg via INTRAVENOUS
  Filled 2017-12-23: qty 1

## 2017-12-23 MED ORDER — ACETAMINOPHEN 650 MG RE SUPP: 650.0000 mg | Freq: Four times a day (QID) | RECTAL | Status: DC | PRN

## 2017-12-23 MED ORDER — PANTOPRAZOLE SODIUM 40 MG PO TBEC: 40.0000 mg | DELAYED_RELEASE_TABLET | Freq: Every day | ORAL | Status: DC

## 2017-12-23 MED ORDER — METOPROLOL TARTRATE 25 MG PO TABS
25.0000 mg | ORAL_TABLET | Freq: Every day | ORAL | Status: DC
  Administered 2017-12-23 – 2017-12-24 (×2): 25 mg via ORAL
  Filled 2017-12-23 (×2): qty 1

## 2017-12-23 MED ORDER — SUCROFERRIC OXYHYDROXIDE 500 MG PO CHEW
1000.0000 mg | CHEWABLE_TABLET | Freq: Three times a day (TID) | ORAL | Status: DC
  Administered 2017-12-23 – 2017-12-25 (×6): 1000 mg via ORAL
  Filled 2017-12-23 (×9): qty 2

## 2017-12-23 MED ORDER — RENA-VITE PO TABS
1.0000 | ORAL_TABLET | Freq: Every day | ORAL | Status: DC
  Administered 2017-12-23 – 2017-12-24 (×2): 1 via ORAL
  Filled 2017-12-23 (×2): qty 1

## 2017-12-23 MED ORDER — ATORVASTATIN CALCIUM 40 MG PO TABS
40.0000 mg | ORAL_TABLET | Freq: Every day | ORAL | Status: DC
  Administered 2017-12-23 – 2017-12-24 (×2): 40 mg via ORAL
  Filled 2017-12-23 (×2): qty 1

## 2017-12-23 MED ORDER — HEPARIN 1000 UNIT/ML FOR PERITONEAL DIALYSIS
INTRAPERITONEAL | Status: DC | PRN
  Filled 2017-12-23: qty 5000

## 2017-12-23 MED ORDER — DICYCLOMINE HCL 10 MG PO CAPS
10.0000 mg | ORAL_CAPSULE | Freq: Once | ORAL | Status: AC
  Administered 2017-12-23: 10 mg via ORAL
  Filled 2017-12-23: qty 1

## 2017-12-23 MED ORDER — MORPHINE SULFATE (PF) 4 MG/ML IV SOLN
4.0000 mg | Freq: Once | INTRAVENOUS | Status: AC
  Administered 2017-12-23: 4 mg via INTRAVENOUS
  Filled 2017-12-23: qty 1

## 2017-12-23 MED ORDER — CALCITRIOL 0.5 MCG PO CAPS
0.5000 ug | ORAL_CAPSULE | Freq: Every day | ORAL | Status: DC
  Administered 2017-12-23 – 2017-12-24 (×2): 0.5 ug via ORAL
  Filled 2017-12-23 (×2): qty 1

## 2017-12-23 MED ORDER — CINACALCET HCL 30 MG PO TABS
30.0000 mg | ORAL_TABLET | Freq: Every day | ORAL | Status: DC
  Administered 2017-12-23 – 2017-12-24 (×2): 30 mg via ORAL
  Filled 2017-12-23 (×2): qty 1

## 2017-12-23 MED ORDER — ALLOPURINOL 100 MG PO TABS
100.0000 mg | ORAL_TABLET | Freq: Every day | ORAL | Status: DC
  Administered 2017-12-23 – 2017-12-24 (×2): 100 mg via ORAL
  Filled 2017-12-23 (×2): qty 1

## 2017-12-23 MED ORDER — HEPARIN 1000 UNIT/ML FOR PERITONEAL DIALYSIS: 500.0000 [IU] | INTRAMUSCULAR | Status: DC | PRN

## 2017-12-23 MED ORDER — PANTOPRAZOLE SODIUM 40 MG IV SOLR
40.0000 mg | Freq: Two times a day (BID) | INTRAVENOUS | Status: DC
  Administered 2017-12-23 – 2017-12-24 (×3): 40 mg via INTRAVENOUS
  Filled 2017-12-23 (×3): qty 40

## 2017-12-23 MED ORDER — SODIUM CHLORIDE 0.9 % IV SOLN
INTRAVENOUS | Status: DC
  Administered 2017-12-24: 10:00:00 via INTRAVENOUS

## 2017-12-23 MED ORDER — IOHEXOL 300 MG/ML  SOLN
100.0000 mL | Freq: Once | INTRAMUSCULAR | Status: AC | PRN
  Administered 2017-12-23: 100 mL via INTRAVENOUS

## 2017-12-23 MED ORDER — ONDANSETRON HCL 4 MG PO TABS: 4.0000 mg | ORAL_TABLET | Freq: Four times a day (QID) | ORAL | Status: DC | PRN

## 2017-12-23 MED ORDER — ONDANSETRON HCL 4 MG/2ML IJ SOLN
4.0000 mg | Freq: Once | INTRAMUSCULAR | Status: AC
  Administered 2017-12-23: 4 mg via INTRAVENOUS
  Filled 2017-12-23: qty 2

## 2017-12-23 MED ORDER — ONDANSETRON HCL 4 MG/2ML IJ SOLN
4.0000 mg | Freq: Four times a day (QID) | INTRAMUSCULAR | Status: DC | PRN
  Administered 2017-12-24: 4 mg via INTRAVENOUS
  Filled 2017-12-23: qty 2

## 2017-12-23 MED ORDER — CAMPHOR-MENTHOL 0.5-0.5 % EX LOTN
TOPICAL_LOTION | CUTANEOUS | Status: DC | PRN
  Administered 2017-12-23: 1 via TOPICAL
  Filled 2017-12-23: qty 222

## 2017-12-23 MED ORDER — GENTAMICIN SULFATE 0.1 % EX CREA
1.0000 "application " | TOPICAL_CREAM | Freq: Every day | CUTANEOUS | Status: DC
  Administered 2017-12-24 – 2017-12-25 (×2): 1 via TOPICAL
  Filled 2017-12-23 (×2): qty 15

## 2017-12-23 MED ORDER — SODIUM CHLORIDE 0.9 % IV SOLN
10.0000 mL/h | Freq: Once | INTRAVENOUS | Status: AC
  Administered 2017-12-23: 10 mL/h via INTRAVENOUS

## 2017-12-23 MED ORDER — ACETAMINOPHEN 325 MG PO TABS
650.0000 mg | ORAL_TABLET | Freq: Four times a day (QID) | ORAL | Status: DC | PRN
  Administered 2017-12-23 – 2017-12-24 (×3): 650 mg via ORAL
  Filled 2017-12-23 (×3): qty 2

## 2017-12-23 MED ORDER — DELFLEX-LC/2.5% DEXTROSE 394 MOSM/L IP SOLN
INTRAPERITONEAL | Status: DC
  Administered 2017-12-24: 5000 mL via INTRAPERITONEAL

## 2017-12-23 MED ORDER — VENLAFAXINE HCL ER 75 MG PO CP24
75.0000 mg | ORAL_CAPSULE | Freq: Every day | ORAL | Status: DC
  Administered 2017-12-23 – 2017-12-24 (×2): 75 mg via ORAL
  Filled 2017-12-23 (×2): qty 1

## 2017-12-23 MED ORDER — ALPRAZOLAM 0.5 MG PO TABS
1.0000 mg | ORAL_TABLET | Freq: Two times a day (BID) | ORAL | Status: DC | PRN
  Administered 2017-12-23 – 2017-12-24 (×3): 1 mg via ORAL
  Filled 2017-12-23: qty 4
  Filled 2017-12-23 (×3): qty 2

## 2017-12-23 NOTE — ED Notes (Signed)
Holding all blood draws until completion of transfusion.

## 2017-12-23 NOTE — ED Notes (Signed)
ED Provider at bedside. 

## 2017-12-23 NOTE — ED Notes (Addendum)
Pt reporting itching shortly after receiving morphine. Pt denies SOB, facial swelling, hives, or general rash. No fever or change in v/s. MD notified and does not believe to be transfusion reaction. Continuing transfusion.

## 2017-12-23 NOTE — Progress Notes (Signed)
Paged equipment for [employer]

## 2017-12-23 NOTE — ED Notes (Signed)
Dr. Vann at bedside 

## 2017-12-23 NOTE — ED Notes (Signed)
Pt noted to be ambulatory to the bathroom.

## 2017-12-23 NOTE — H&P (Signed)
History and Physical    Patricia [name] LMB:[id number] DOB: November 15, 1975 DOA: 12/22/2017  PCP: Blair Heys, PA-C  Patient coming from: Home.  Chief Complaint: Dark stools.  HPI: Patricia [name] is a 42 y.o. female with history of ESRD on peritoneal dialysis was recently admitted for symptomatic anemia and discharged 4 days ago presents to the ER because of patient noticing increasing dark stools black in color concerning for GI bleed.  In addition patient states she also has been having lower abdominal pain which has been getting worse since discharge.  Denies any nausea vomiting has been having some occasional diarrhea.  Denies any fever chills or any discolored dialysate fluid from the peritoneal dialysis catheter.  Feeling weak and short of breath on walking.  ED Course: In the ER patient had hemoglobin which was initially around 8.1 following which patient had a large bowel movement and hemoglobin further dropped to 7.6.  Given the symptoms of abdominal discomfort CT abdomen and pelvis was ordered which is pending.  Also that patient has been having dropping hemoglobin with possible GI bleed 2 units of PRBC has been ordered and admitted for further management.  Review of Systems: As per HPI, rest all negative.   Past Medical History:  Diagnosis Date  . Acid reflux   . Anxiety   . Arthritis   . Dyspnea   . Dysrhythmia    tachycardia  . ESRD (end stage renal disease) ([place])    TTHSAT- [place]  . Gout   . Headache(784.0)    "related to chemo; sometimes weekly" (09/12/2013)  . High cholesterol   . History of blood transfusion "a couple"   "related to low counts"  . Hypertension   . Iron deficiency anemia    "get epogen shots q month" (02/20/2014)  . 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 Peritoneal dialysis status [facility])     Past Surgical History:  Procedure Laterality Date  . ABDOMINAL HYSTERECTOMY  2010   "laparoscopic"  . ANKLE FRACTURE SURGERY Right 1994  . AV FISTULA PLACEMENT Left 04/14/2016   Procedure: ARTERIOVENOUS (AV) FISTULA CREATION;  Surgeon: Angelia Mould, MD;  Location: [facility] OR;  Service: Vascular;  Laterality: Left;  . AV FISTULA PLACEMENT Left 08/09/2017   Procedure: INSERTION OF ARTERIOVENOUS (AV) GORE-TEX GRAFT LEFT ARM;  Surgeon: Elam Dutch, MD;  Location: [place];  Service: Vascular;  Laterality: Left;  . [place] REMOVAL Left 08/11/2017   Procedure: REMOVAL OF ARTERIOVENOUS GORETEX GRAFT ([place]);  Surgeon: Serafina Mitchell, MD;  Location[facility] OR;  Service: Vascular;  Laterality: Left;  . BIOPSY  09/03/2017   Procedure: BIOPSY;  Surgeon: Otis Brace, MD;  Location: [place];  Service: Gastroenterology;;  . CARDIAC CATHETERIZATION  2000's  . COLONOSCOPY WITH PROPOFOL N/A 09/04/2017   Procedure: COLONOSCOPY WITH PROPOFOL;  Surgeon: Ronnette Juniper, MD;  Location: [place];  Service: Gastroenterology;  Laterality: N/A;  . ESOPHAGOGASTRODUODENOSCOPY    . ESOPHAGOGASTRODUODENOSCOPY (EGD) WITH PROPOFOL Left 06/04/2017   Procedure: ESOPHAGOGASTRODUODENOSCOPY (EGD) WITH PROPOFOL;  Surgeon: Arta Silence, MD;  Location: [place];  Service: Endoscopy;  Laterality: Left;  . ESOPHAGOGASTRODUODENOSCOPY (EGD) WITH PROPOFOL N/A 09/03/2017   Procedure: ESOPHAGOGASTRODUODENOSCOPY (EGD) WITH PROPOFOL;  Surgeon: Otis Brace, MD;  Location: [place];  Service: Gastroenterology;  Laterality: N/A;  . FISTULA SUPERFICIALIZATION Left 04/02/2017   Procedure: FISTULA SUPERFICIALIZATION LEFT ARM;  Surgeon: Serafina Mitchell, MD;  Location: [place];  Service: Vascular;  Laterality: Left;  . FISTULOGRAM Left 04/07/2016   Procedure: FISTULOGRAM;  Surgeon: Maeola Harman, MD;  Location: [facility] OR;  Service: Vascular;  Laterality: Left;  . FRACTURE SURGERY     right ankle  . GIVENS CAPSULE STUDY N/A 10/29/2017   Procedure: GIVENS CAPSULE STUDY;  Surgeon: Charlott Rakes, MD;   Location: [facility] ENDOSCOPY;  Service: Endoscopy;  Laterality: N/A;  . INSERTION OF DIALYSIS CATHETER Right 04/07/2016   Procedure: INSERTION OF DIALYSIS CATHETER;  Surgeon: Maeola Harman, MD;  Location[facility] OR;  Service: Vascular;  Laterality: Right;  . INSERTION OF DIALYSIS CATHETER Right 04/04/2017   Procedure: INSERTION OF DIALYSIS CATHETER;  Surgeon: Maeola Harman, MD;  Location: [facility] OR;  Service: Vascular;  Laterality: Right;  . LIGATION OF ARTERIOVENOUS  FISTULA Left 04/14/2016   Procedure: LIGATION OF ARTERIOVENOUS  FISTULA;  Surgeon: Chuck Hint, MD;  Location: [facility] OR;  Service: Vascular;  Laterality: Left;  . PATCH ANGIOPLASTY Left 06/19/2016   Procedure: PATCH ANGIOPLASTY LEFT ARTERIOVENOUS FISTULA;  Surgeon: Chuck Hint, MD;  Location: [facility] OR;  Service: Vascular;  Laterality: Left;  . POLYPECTOMY  09/04/2017   Procedure: POLYPECTOMY;  Surgeon: Kerin Salen, MD;  Location: [facility] ENDOSCOPY;  Service: Gastroenterology;;  . REVISON OF ARTERIOVENOUS FISTULA Left 06/19/2016   Procedure: REVISION SUPERFICIALIZATION OF BRACHIOCEPHALIC ARTERIOVENOUS FISTULA;  Surgeon: Chuck Hint, MD;  Location: [facility] OR;  Service: Vascular;  Laterality: Left;     reports that she has never smoked. She has never used smokeless tobacco. She reports that she does not drink alcohol or use drugs.  Allergies  Allergen Reactions  . Nsaids Other (See Comments)    Cannot take due to Kidney disease/Kidney function   . Tolmetin Other (See Comments)    Cannot take due to Kidney Disease    Family History  Problem Relation Age of Onset  . Hypertension Mother   . Thyroid disease Mother   . Coronary artery disease Father   . Hypertension Father   . Diabetes Father     Prior to Admission medications   Medication Sig Start Date End Date Taking? Authorizing Provider  allopurinol (ZYLOPRIM) 100 MG tablet Take 100 mg by mouth at bedtime.    Yes [provider]  ALPRAZolam  Prudy Feeler) 1 MG tablet Take 1 mg by mouth 2 (two) times daily as needed for anxiety.  03/19/17  Yes [provider]  aspirin 81 MG chewable tablet Chew 1 tablet (81 mg total) by mouth daily. Patient taking differently: Chew 81 mg by mouth at bedtime.  10/03/13  Yes Rodolph Bong, MD  atorvastatin (LIPITOR) 40 MG tablet Take 40 mg by mouth at bedtime.  03/14/14  Yes [provider]  calcitRIOL (ROCALTROL) 0.5 MCG capsule Take 1 capsule (0.5 mcg total) by mouth daily with breakfast. Patient taking differently: Take 0.5 mcg by mouth at bedtime.  06/04/17  Yes Osvaldo Shipper, MD  cinacalcet (SENSIPAR) 30 MG tablet Take 1 tablet (30 mg total) by mouth daily. Patient taking differently: Take 30 mg by mouth at bedtime.  08/19/17  Yes Burnadette Pop, MD  diphenhydramine-acetaminophen (TYLENOL PM) 25-500 MG TABS tablet Take 2 tablets by mouth at bedtime as needed (sleep).    Yes [provider]  metoprolol tartrate (LOPRESSOR) 25 MG tablet Take 25 mg by mouth at bedtime.  04/17/16  Yes [provider]  multivitamin (RENA-VIT) TABS tablet Take 1 tablet by mouth at bedtime.    Yes [provider]  ondansetron [facility])  8 MG tablet Take 8 mg by mouth every 8 (eight) hours as needed for nausea or vomiting.   Yes [provider]  pantoprazole (PROTONIX) 40 MG tablet Take 1 tablet (40 mg total) by mouth daily. Patient taking differently: Take 40 mg by mouth at bedtime.  09/04/17  Yes Shelly Coss, MD  promethazine (PHENERGAN) 25 MG tablet Take 25 mg by mouth every 6 (six) hours as needed for nausea or vomiting.   Yes [provider]  senna (SENOKOT) 8.6 MG TABS tablet Take 2 tablets (17.2 mg total) by mouth at bedtime. 04/15/16  Yes Nita Sells, MD  sucroferric oxyhydroxide (VELPHORO) 500 MG chewable tablet Chew 1,000 mg by mouth 3 (three) times daily with meals.    Yes [provider]  venlafaxine XR (EFFEXOR-XR) 75 MG 24 hr capsule  Take 75 mg by mouth at bedtime.    Yes [provider]    Physical Exam: Vitals:   12/23/17 0213 12/23/17 0215 12/23/17 0230 12/23/17 0243  BP: (!) 145/81 136/86 (!) 147/83 (!) 146/90  Pulse: 81 82 86 88  Resp: $Remo'17 16 17 20  'tjQqe$ Temp: 98.2 F (36.8 C)   97.7 F (36.5 C)  TempSrc: Oral   Oral  SpO2: 100% 99% 100%   Weight:      Height:          Constitutional: Moderately built and nourished. Vitals:   12/23/17 0213 12/23/17 0215 12/23/17 0230 12/23/17 0243  BP: (!) 145/81 136/86 (!) 147/83 (!) 146/90  Pulse: 81 82 86 88  Resp: $Remo'17 16 17 20  'YeAut$ Temp: 98.2 F (36.8 C)   97.7 F (36.5 C)  TempSrc: Oral   Oral  SpO2: 100% 99% 100%   Weight:      Height:       Eyes: Anicteric no pallor. ENMT: No discharge from the ears eyes nose or mouth. Neck: No mass felt.  No neck rigidity. Respiratory: No rhonchi or crepitations. Cardiovascular: S1-S2 heard. Abdomen: Soft nontender bowel sounds present.  Dialysis catheter is seen. Musculoskeletal: No edema. Skin: No rash. Neurologic: Alert awake oriented to time place and person.  Moves all extremities. Psychiatric: Appears normal.   Labs on Admission: I have personally reviewed following labs and imaging studies  CBC: Recent Labs  Lab 12/18/17 1836 12/19/17 0553 12/22/17 2100 12/23/17 0055  WBC 7.0 7.3 6.3 6.8  NEUTROABS  --   --  3.6  --   HGB 8.1* 8.4* 8.1* 7.6*  HCT 26.0* 26.1* 25.2* 24.2*  MCV 92.5 90.3 93.0 92.4  PLT 306 264 267 161   Basic Metabolic Panel: Recent Labs  Lab 12/18/17 1836 12/19/17 0553 12/22/17 2024  NA 139 137 142  K 3.3* 3.4* 4.2  CL 94* 94* 101  CO2 23 22 19*  GLUCOSE 113* 95 88  BUN 64* 62* 66*  CREATININE 16.30* 15.55* 17.18*  CALCIUM 7.9* 7.9* 8.0*  PHOS 3.5 4.8*  --    GFR: Estimated Creatinine Clearance: 5.5 mL/min (A) (by C-G formula based on SCr of 17.18 mg/dL (H)). Liver Function Tests: Recent Labs  Lab 12/18/17 1836 12/19/17 0553 12/22/17 2024  AST  --   --  29  ALT   --   --  20  ALKPHOS  --   --  65  BILITOT  --   --  0.7  PROT  --   --  7.0  ALBUMIN 3.3* 3.2* 3.5   No results for input(s): LIPASE, AMYLASE in the last 168 hours.  No results for input(s): AMMONIA in the last 168 hours. Coagulation Profile: Recent Labs  Lab 12/22/17 2024  INR 0.98   Cardiac Enzymes: No results for input(s): CKTOTAL, CKMB, CKMBINDEX, TROPONINI in the last 168 hours. BNP (last 3 results) No results for input(s): PROBNP in the last 8760 hours. HbA1C: No results for input(s): HGBA1C in the last 72 hours. CBG: No results for input(s): GLUCAP in the last 168 hours. Lipid Profile: No results for input(s): CHOL, HDL, LDLCALC, TRIG, CHOLHDL, LDLDIRECT in the last 72 hours. Thyroid Function Tests: No results for input(s): TSH, T4TOTAL, FREET4, T3FREE, THYROIDAB in the last 72 hours. Anemia Panel: No results for input(s): VITAMINB12, FOLATE, FERRITIN, TIBC, IRON, RETICCTPCT in the last 72 hours. Urine analysis:    Component Value Date/Time   COLORURINE YELLOW 04/03/2016 1626   APPEARANCEUR HAZY (A) 04/03/2016 1626   LABSPEC 1.012 04/03/2016 1626   PHURINE 6.0 04/03/2016 1626   GLUCOSEU 150 (A) 04/03/2016 1626   HGBUR NEGATIVE 04/03/2016 1626   BILIRUBINUR NEGATIVE 04/03/2016 1626   KETONESUR NEGATIVE 04/03/2016 1626   PROTEINUR >=300 (A) 04/03/2016 1626   UROBILINOGEN 0.2 09/19/2014 1620   NITRITE NEGATIVE 04/03/2016 1626   LEUKOCYTESUR NEGATIVE 04/03/2016 1626   Sepsis Labs: $RemoveBefo'@LABRCNTIP'PgMDgBTHYpB$ (procalcitonin:4,lacticidven:4) )No results found for this or any previous visit (from the past 240 hour(s)).   Radiological Exams on Admission: No results found.  EKG: Independently reviewed.  Normal sinus rhythm.  Assessment/Plan Principal Problem:   Acute GI bleeding Active Problems:   Morbid obesity (HCC)   HTN (hypertension)   PAT (paroxysmal atrial tachycardia) (HCC)   ESRD on peritoneal dialysis (HCC)   Symptomatic anemia    1. Possible GI bleed -note that  patient is also taking iron supplements.  Patient has had EGD and colonoscopy in August 2019 about 5 months ago which showed duodenitis and gastritis and also erythematous mucosa of the ascending transverse and sigmoid colon with some diverticulosis.  Following which patient had capsule endoscopy in October which did not show any definite source of bleeding.  At this time 2 units of PRBC has been ordered.  Since patient complains of melanotic stools we will keep patient on Protonix.  Follow CBC after transfusion.  Will hold patient's aspirin and will consult gastroenterologist in the morning. 2. Abdominal pain -patient is afebrile abdomen appears benign no rigidity or any signs of peritonitis.  CT abdomen pelvis is pending.  May check cell count in dialysis fluid while having dialysis.  Presently no signs of infection.  Follow CAT scan results. 3. Acute blood loss anemia with history of ESRD follow CBC patient is receiving PRBC transfusion. 4. Hypertension on metoprolol. 5. ESRD on peritoneal dialysis please consult nephrology for dialysis needs. 6. Hyperlipidemia on statins.   DVT prophylaxis: SCDs. Code Status: Full code. Family Communication: Discussed with patient. Disposition Plan: Home. Consults called: None. Admission status: Observation   Rise Patience MD [facility] Pager [phone].  If 7PM-7AM, please contact night-coverage [email or web address] Password [email or web address]  12/23/2017, 3:09 AM

## 2017-12-23 NOTE — Consult Note (Signed)
Patricia [name]: 10:18 AM 12/23/2017  LOS: 0 days    Referring Provider: Dr. Eliseo [name] Primary Care Physician:  Patricia Heys, PA-C Primary Gastroenterologist: Patricia [name].  Has been seen by [facility] as an inpatient.    Reason for Consultation: Anemia.  FOBT positive.   HPI: Patricia [name] is a 42 y.o. female.  PMH ESRD.  She had been on hemodialysis for about a year but began peritoneal dialysis in spring 2019.  GERD.  Depression, anxiety.  Black stools and anemia with inpatient evaluation by [facility] in May, August, October 2019.  She required blood transfusions for the anemia.  Never seen by Patricia [name] as outpt and no response from their office when she left messages trying to get an appointment.  Therefore her home health nurse contact had made an appointment with Dr. Loletha [name] for/7/20. 05/2017 EGD.  By Dr. Arta [name] for eval of abdominal pain, melena, anemia.  Noted medium hiatal hernia.  Patchy, moderate inflammation with congestion/edema, erosions, shallow ulcerations in the prepyloric region focal mild inflammation/edema and erythema in the duodenal bulb.  H Pylori serologies:  09/03/2017 EGD by Dr. Otis [name].  For recurrent melena.  Nonobstructing Schatzki's ring in GE junction.  Small hiatal hernia.  Abnormal esophageal motility.  Mild gastritis in the prepylorus.  Scattered mild inflammation in the duodenal bulb.  Pathology showed benign gastric mucosa, no H. pylori. 09/04/2017 colonoscopy.  By Dr. Therisa [name].  Patchy edema/erythema in the sigmoid, transverse and descending colon.  Mucosal friability in the ascending colon with minimal fresh blood in the cecum and a sending colon.  No active bleeding during or after thorough lavage.  A few small mouth diverticula in the sigmoid and descending colon 2 sessile  polyps, 3 to 8 mm in size, removed from the transverse colon.  Polyp pathology: sessile serrated, no high-grade dysplasia or malignancy; no pathology pertaining to the erythematous mucosa.  11/02/16 Capsule endoscopy.  Findings included mild gastritis and duodenitis.  Nonspecific focal nodular area in the very distal small bowel.  "Obscure GI bleed, no source of anemia seen on capsule endoscopy; anemia source likely multifactorial from ESRD and other factors. (recommend) Hematology outpatient [name] for further evaluation of anemia"  At least 7 CT scans of the abdomen/pelvis since 2013 for evaluation of abdominal pain. 06/02/2017 CT renal stone study for abdominal pain, melena.  Trace ascites and associated with free air from PD.  Scattered diverticulosis of the colon.  Periumbilical fat-containing hernia. 08/15/17 CT abdomen pelvis with contrast for abdominal pain.  Showed small umbilical/supraumbilical fat-containing hernias small intraperitoneal gas secondary to PD.  PD catheter in place.  Increased stool burden.  Scattered colonic diverticulosis.  Trace pericolonic edema at the left paracolic gutter, may reflect stigmata of PD, possibly uncomplicated left-sided diverticulitis.  Gallbladder sludge, possible tiny gallbladder stone. Patient receives Epogen through her kidney doctors.  Hgb fluctuates a bit but baseline looks to be 8 -9 going back to October.  Hgb is low at 6.9 on 10/28/2017 Patient spent a day in the hospital 12/14 -05/19/2017 for symptomatic anemia, Hgb 8.1, she  received 1 U PRBC and Hgb 8.4 at discharge.  She was having dark stools.  After discharge she had brown stool but 2 days ago began having dark, loose stools.  No obvious blood.  Recurrent dizziness.  Came back to the ED where Hgb 8.1 but quickly dropped to 7.6 in the ED.  Abdominal pain, which has no correlation with the color of her stools and is chronic, worked up with a contrasted CT scan which is normal other than presence of  peritoneal dialysis, post hysterectomy,  Peritoneal dialysis fluid studies mentioned but not ordered. WBCs normal.  No fever.  No tachycardia, no hypotension. At admission 12/18 Hgb 8.1 >> 7.6 within 4 hours >> 9.2 this AM after 2 U PRBCs.   MCV 90.    Receives Epogen through her renal doctors.  I do not see documentation of Feraheme infusions but if these are done at dialysis it would not be listed.  She is compliant with once daily Protonix.  No aspirin or NSAIDs.  Not taking oral iron. Use NSAIDs.  Does not drink alcohol.  Patient has not been referred to hematology  Past Medical History:  Diagnosis Date  . Acid reflux   . Anxiety   . Arthritis   . Dyspnea   . Dysrhythmia    tachycardia  . ESRD (end stage renal disease) ([place])    [facility]- [place]  . GI bleeding 12/2017  . Gout   . Headache(784.0)    "related to chemo; sometimes weekly" (09/12/2013)  . High cholesterol   . History of blood transfusion "a couple"   "related to low counts"  . Hypertension   . Iron deficiency anemia    "get epogen shots q month" (02/20/2014)  . 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 Peritoneal dialysis status [facility])     Past Surgical History:  Procedure Laterality Date  . ABDOMINAL HYSTERECTOMY  2010   "laparoscopic"  . ANKLE FRACTURE SURGERY Right 1994  . AV FISTULA PLACEMENT Left 04/14/2016   Procedure: ARTERIOVENOUS (AV) FISTULA CREATION;  Surgeon: Angelia Mould, MD;  Location: [facility] OR;  Service: Vascular;  Laterality: Left;  . AV FISTULA PLACEMENT Left 08/09/2017   Procedure: INSERTION OF ARTERIOVENOUS (AV) GORE-TEX GRAFT LEFT ARM;  Surgeon: Elam Dutch, MD;  Location: [place];  Service: Vascular;  Laterality: Left;  . [place] REMOVAL Left 08/11/2017   Procedure: REMOVAL OF ARTERIOVENOUS GORETEX GRAFT ([place]);  Surgeon: Serafina Mitchell, MD;  Location: [facility] OR;  Service: Vascular;  Laterality: Left;  . BIOPSY  09/03/2017    Procedure: BIOPSY;  Surgeon: Patricia Brace, MD;  Location: [place];  Service: Gastroenterology;;  . CARDIAC CATHETERIZATION  2000's  . COLONOSCOPY WITH PROPOFOL N/A 09/04/2017   Procedure: COLONOSCOPY WITH PROPOFOL;  Surgeon: Ronnette Juniper, MD;  Location: [place];  Service: Gastroenterology;  Laterality: N/A;  . ESOPHAGOGASTRODUODENOSCOPY    . ESOPHAGOGASTRODUODENOSCOPY (EGD) WITH PROPOFOL Left 06/04/2017   Procedure: ESOPHAGOGASTRODUODENOSCOPY (EGD) WITH PROPOFOL;  Surgeon: Patricia Silence, MD;  Location: [place];  Service: Endoscopy;  Laterality: Left;  . ESOPHAGOGASTRODUODENOSCOPY (EGD) WITH PROPOFOL N/A 09/03/2017   Procedure: ESOPHAGOGASTRODUODENOSCOPY (EGD) WITH PROPOFOL;  Surgeon: Patricia Brace, MD;  Location: [place];  Service: Gastroenterology;  Laterality: N/A;  . FISTULA SUPERFICIALIZATION Left 04/02/2017   Procedure: FISTULA SUPERFICIALIZATION LEFT ARM;  Surgeon: Serafina Mitchell, MD;  Location: [place];  Service: Vascular;  Laterality: Left;  . FISTULOGRAM Left 04/07/2016  Procedure: FISTULOGRAM;  Surgeon: Waynetta Sandy, MD;  Location: [place];  Service: Vascular;  Laterality: Left;  . FRACTURE SURGERY     right ankle  . GIVENS CAPSULE STUDY N/A 10/29/2017   Procedure: GIVENS CAPSULE STUDY;  Surgeon: Wilford Corner, MD;  Location: [place];  Service: Endoscopy;  Laterality: N/A;  . INSERTION OF DIALYSIS CATHETER Right 04/07/2016   Procedure: INSERTION OF DIALYSIS CATHETER;  Surgeon: Waynetta Sandy, MD;  Location: [place];  Service: Vascular;  Laterality: Right;  . INSERTION OF DIALYSIS CATHETER Right 04/04/2017   Procedure: INSERTION OF DIALYSIS CATHETER;  Surgeon: Waynetta Sandy, MD;  Location: [place];  Service: Vascular;  Laterality: Right;  . LIGATION OF ARTERIOVENOUS  FISTULA Left 04/14/2016   Procedure: LIGATION OF ARTERIOVENOUS  FISTULA;  Surgeon: Angelia Mould, MD;  Location: [place];  Service: Vascular;  Laterality:  Left;  . PATCH ANGIOPLASTY Left 06/19/2016   Procedure: PATCH ANGIOPLASTY LEFT ARTERIOVENOUS FISTULA;  Surgeon: Angelia Mould, MD;  Location: [place];  Service: Vascular;  Laterality: Left;  . POLYPECTOMY  09/04/2017   Procedure: POLYPECTOMY;  Surgeon: Ronnette Juniper, MD;  Location[facility] ENDOSCOPY;  Service: Gastroenterology;;  . REVISON OF ARTERIOVENOUS FISTULA Left 06/19/2016   Procedure: REVISION SUPERFICIALIZATION OF BRACHIOCEPHALIC ARTERIOVENOUS FISTULA;  Surgeon: Angelia Mould, MD;  Location: [place];  Service: Vascular;  Laterality: Left;    Prior to Admission medications   Medication Sig Start Date End Date Taking? Authorizing Provider  allopurinol (ZYLOPRIM) 100 MG tablet Take 100 mg by mouth at bedtime.    Yes [provider]  ALPRAZolam Duanne Moron) 1 MG tablet Take 1 mg by mouth 2 (two) times daily as needed for anxiety.  03/19/17  Yes [provider]  aspirin 81 MG chewable tablet Chew 1 tablet (81 mg total) by mouth daily. Patient taking differently: Chew 81 mg by mouth at bedtime.  10/03/13  Yes Eugenie Filler, MD  atorvastatin (LIPITOR) 40 MG tablet Take 40 mg by mouth at bedtime.  03/14/14  Yes [provider]  calcitRIOL (ROCALTROL) 0.5 MCG capsule Take 1 capsule (0.5 mcg total) by mouth daily with breakfast. Patient taking differently: Take 0.5 mcg by mouth at bedtime.  06/04/17  Yes Bonnielee Haff, MD  cinacalcet (SENSIPAR) 30 MG tablet Take 1 tablet (30 mg total) by mouth daily. Patient taking differently: Take 30 mg by mouth at bedtime.  08/19/17  Yes Shelly Coss, MD  diphenhydramine-acetaminophen (TYLENOL PM) 25-500 MG TABS tablet Take 2 tablets by mouth at bedtime as needed (sleep).    Yes [provider]  metoprolol tartrate (LOPRESSOR) 25 MG tablet Take 25 mg by mouth at bedtime.  04/17/16  Yes [provider]  multivitamin (RENA-VIT) TABS tablet Take 1 tablet by mouth at bedtime.    Yes [provider]    ondansetron (ZOFRAN) 8 MG tablet Take 8 mg by mouth every 8 (eight) hours as needed for nausea or vomiting.   Yes [provider]  pantoprazole (PROTONIX) 40 MG tablet Take 1 tablet (40 mg total) by mouth daily. Patient taking differently: Take 40 mg by mouth at bedtime.  09/04/17  Yes Shelly Coss, MD  promethazine (PHENERGAN) 25 MG tablet Take 25 mg by mouth every 6 (six) hours as needed for nausea or vomiting.   Yes [provider]  senna (SENOKOT) 8.6 MG TABS tablet Take 2 tablets (17.2 mg total) by mouth at bedtime. 04/15/16  Yes Nita Sells, MD  sucroferric oxyhydroxide (VELPHORO) 500  MG chewable tablet Chew 1,000 mg by mouth 3 (three) times daily with meals.    Yes [provider]  venlafaxine XR (EFFEXOR-XR) 75 MG 24 hr capsule Take 75 mg by mouth at bedtime.    Yes [provider]    Scheduled Meds: . allopurinol  100 mg Oral QHS  . atorvastatin  40 mg Oral QHS  . calcitRIOL  0.5 mcg Oral QHS  . cinacalcet  30 mg Oral Q supper  . metoprolol tartrate  25 mg Oral QHS  . multivitamin  1 tablet Oral QHS  . pantoprazole (PROTONIX) IV  40 mg Intravenous Q12H  . sucroferric oxyhydroxide  1,000 mg Oral TID WC  . venlafaxine XR  75 mg Oral QHS   Infusions:  PRN Meds: acetaminophen **OR** acetaminophen, ALPRAZolam, camphor-menthol, ondansetron **OR** ondansetron (ZOFRAN) IV   Allergies as of 12/22/2017 - Review Complete 12/22/2017  Allergen Reaction Noted  . Nsaids Other (See Comments) 01/27/2013  . Tolmetin Other (See Comments) 07/20/2015    Family History  Problem Relation Age of Onset  . Hypertension Mother   . Thyroid disease Mother   . Coronary artery disease Father   . Hypertension Father   . Diabetes Father     Social History   Socioeconomic History  . Marital status: Single    Spouse name: Not on file  . Number of children: Not on file  . Years of education: Not on file  . Highest education level: Not on file   Occupational History  . Occupation: disabled  Social Needs  . Financial resource strain: Not on file  . Food insecurity:    Worry: Not on file    Inability: Not on file  . Transportation needs:    Medical: Not on file    Non-medical: Not on file  Tobacco Use  . Smoking status: Never Smoker  . Smokeless tobacco: Never Used  Substance and Sexual Activity  . Alcohol use: No  . Drug use: No  . Sexual activity: Not Currently    Birth control/protection: Surgical  Lifestyle  . Physical activity:    Days per week: Not on file    Minutes per session: Not on file  . Stress: Not on file  Relationships  . Social connections:    Talks on phone: Not on file    Gets together: Not on file    Attends religious service: Not on file    Active member of club or organization: Not on file    Attends meetings of clubs or organizations: Not on file    Relationship status: Not on file  . Intimate partner violence:    Fear of current or ex partner: No    Emotionally abused: No    Physically abused: No    Forced sexual activity: No  Other Topics Concern  . Not on file  Social History Narrative  . Not on file    REVIEW OF SYSTEMS: Constitutional: Weakness, fatigue. ENT:  No nose bleeds Pulm: No cough.  Mild dyspnea on exertion. CV:  No palpitations, no LE edema.  Chest pain GU:  No hematuria, no frequency GI: No dysphagia.  No constipation. Heme: Per HPI.  Patient denies excessive or unusual bleeding other than the black stools. Transfusions: Multiple occasions this year. Neuro:  No headaches, no peripheral tingling or numbness Derm:  No itching, no rash or sores.  Endocrine:  No sweats or chills.  No polyuria or dysuria Immunization: Not queried Travel:  None beyond local  counties in last few months.    PHYSICAL EXAM: Vital signs in last 24 hours: Vitals:   12/23/17 0800 12/23/17 0854  BP: 136/64 (!) 156/76  Pulse: 74 72  Resp: (!) 22 19  Temp:  97.8 F (36.6 C)  SpO2: 98%  99%   Wt Readings from Last 3 Encounters:  12/22/17 115.2 kg  12/19/17 118.2 kg  10/30/17 113.9 kg    General: Obese, unwell appearing but not acutely ill looking, lethargic AAF Head: No facial asymmetry or swelling.  No signs of head trauma. Eyes: Scleral icterus.  No conjunctival pallor. Ears: Not hard of hearing Nose: No congestion, no discharge. Mouth: Oral mucosa pink, moist, clear.  Tongue midline. Neck: No JVD, no masses, no thyromegaly. Lungs: Good breath sounds.  No dyspnea, no cough.  Clear bilaterally. Heart: RRR.  No MRG.  S1, S2 present Abdomen: Soft.  Nontender, nondistended.  PD access site covered with a bandage which was not removed for exam.  Hypoactive bowel sounds but no tinkling or tympanitic bowel sounds.  No bruits.  Small umbilical hernia..   Rectal: No stool in the vault, no blood, no masses. Musc/Skeltl: No joint swelling, redness, deformity Extremities: No CCE. Neurologic: Lethargic/somnolent but arousable.  Oriented x3.  Good historian.  No tremors, no limb weakness. Skin: Some sores on her face. Nodes: No cervical adenopathy Psych: Pleasant, cooperative.  Not anxious  Intake/Output from previous day: 12/18 0701 - 12/19 0700 In: 1393.5 [I.V.:448.5; Blood:945] Out: -  Intake/Output this shift: No intake/output data recorded.  LAB RESULTS: Recent Labs    12/22/17 2100 12/23/17 0055 12/23/17 0730  WBC 6.3 6.8 6.4  HGB 8.1* 7.6* 9.2*  HCT 25.2* 24.2* 29.0*  PLT 267 230 215   BMET Lab Results  Component Value Date   NA 138 12/23/2017   NA 142 12/22/2017   NA 137 12/19/2017   K 4.2 12/23/2017   K 4.2 12/22/2017   K 3.4 (L) 12/19/2017   CL 99 12/23/2017   CL 101 12/22/2017   CL 94 (L) 12/19/2017   CO2 19 (L) 12/23/2017   CO2 19 (L) 12/22/2017   CO2 22 12/19/2017   GLUCOSE 89 12/23/2017   GLUCOSE 88 12/22/2017   GLUCOSE 95 12/19/2017   BUN 70 (H) 12/23/2017   BUN 66 (H) 12/22/2017   BUN 62 (H) 12/19/2017   CREATININE 17.52 (H)  12/23/2017   CREATININE 17.18 (H) 12/22/2017   CREATININE 15.55 (H) 12/19/2017   CALCIUM 7.6 (L) 12/23/2017   CALCIUM 8.0 (L) 12/22/2017   CALCIUM 7.9 (L) 12/19/2017   LFT Recent Labs    12/22/17 2024  PROT 7.0  ALBUMIN 3.5  AST 29  ALT 20  ALKPHOS 65  BILITOT 0.7   PT/INR Lab Results  Component Value Date   INR 0.98 12/22/2017   INR 1.04 04/14/2016   INR 0.98 06/18/2015   Hepatitis Panel No results for input(s): HEPBSAG, HCVAB, HEPAIGM, HEPBIGM in the last 72 hours. C-Diff No components found for: CDIFF Lipase     Component Value Date/Time   LIPASE 30 01/31/2016 0921    Drugs of Abuse     Component Value Date/Time   LABOPIA NONE DETECTED 01/19/2015 0145   COCAINSCRNUR NONE DETECTED 01/19/2015 0145   LABBENZ NONE DETECTED 01/19/2015 0145   AMPHETMU NONE DETECTED 01/19/2015 0145   THCU NONE DETECTED 01/19/2015 0145   LABBARB NONE DETECTED 01/19/2015 0145     RADIOLOGY STUDIES: Ct Abdomen Pelvis W Contrast  Result Date: 12/23/2017 CLINICAL  DATA:  Generalized abdominal pain EXAM: CT ABDOMEN AND PELVIS WITH CONTRAST TECHNIQUE: Multidetector CT imaging of the abdomen and pelvis was performed using the standard protocol following bolus administration of intravenous contrast. CONTRAST:  165mL OMNIPAQUE IOHEXOL 300 MG/ML  SOLN COMPARISON:  CT abdomen pelvis 08/15/2017 FINDINGS: LOWER CHEST: There is no basilar pleural or apical pericardial effusion. HEPATOBILIARY: The hepatic contours and density are normal. There is no intra- or extrahepatic biliary dilatation. The gallbladder is normal. PANCREAS: The pancreatic parenchymal contours are normal and there is no ductal dilatation. There is no peripancreatic fluid collection. SPLEEN: Normal. ADRENALS/URINARY TRACT: --Adrenal glands: Normal. --Right kidney/ureter: No hydronephrosis, nephroureterolithiasis, perinephric stranding or solid renal mass. --Left kidney/ureter: No hydronephrosis, nephroureterolithiasis, perinephric  stranding or solid renal mass. --Urinary bladder: Normal for degree of distention STOMACH/BOWEL: --Stomach/Duodenum: There is no hiatal hernia or other gastric abnormality. The duodenal course and caliber are normal. --Small bowel: No dilatation or inflammation. --Colon: No focal abnormality. --Appendix: Normal. VASCULAR/LYMPHATIC: Normal course and caliber of the major abdominal vessels. No abdominal or pelvic lymphadenopathy. REPRODUCTIVE: Status post hysterectomy. No adnexal mass. MUSCULOSKELETAL. No bony spinal canal stenosis or focal osseous abnormality. OTHER: Peritoneal dialysis catheter coiled in the low pelvis. Unchanged fat containing umbilical and supraumbilical hernias. IMPRESSION: No acute abdominal or pelvic abnormality. Electronically Signed   By: Ulyses Jarred M.D.   On: 12/23/2017 04:34     IMPRESSION:   *    Anemia, metastatic.  Chronic.  *     FOBT positive, hard stools intermittent recurrent.  Multiple work-ups beginning May 2019 with findings as above including gastritis, duodenitis for which she is taking Protonix.  Colon polyps and nonspecific colonic erythema (latter not biopsied).  Nodular small bowel mucosa in the ileum on capsule endoscopy..  *     ESRD.  On peritoneal dialysis.  *      Abdominal pain.  Chronic.  Multiple CTs over the years for this and current CT unremarkable.    PLAN:     *    Mall bowel enteroscopy tomorrow.  She can eat today so I ordered a renal diet.   Azucena Freed  12/23/2017, 10:18 AM Phone [phone]

## 2017-12-23 NOTE — ED Notes (Signed)
Patient transported to CT. This Dealer.

## 2017-12-23 NOTE — Progress Notes (Addendum)
Patient admitted after midnight, please see H&P.  Here with fatigue.  Found to be heme + and have a low Hgb.  S/p 2 units PRBC with good response.  Nephrology consult for PD-- will need fluid sent off as she is having lower abdominal pain.  No fevers.  Also a GI consult (she is unassigned but has future appointment with Dr. Loletha Carrow) although it appears she has already had recent EGD/colonoscopy as well as capsule endoscopy.  Last seen in October by GI with recommendations for hematology referral but appears never happened.  Since s/p 2 units, unable to send any other labs like LDH/haptoglobin etc Eulogio [place] DO

## 2017-12-23 NOTE — H&P (View-Only) (Signed)
[facility] Consult: 10:18 AM 12/23/2017  LOS: 0 days    Referring Provider: Dr. Eliseo Squires Primary Care Physician:  Blair Heys, PA-C Primary Gastroenterologist: Althia Forts.  Has been seen by [facility] as an inpatient.    Reason for Consultation: Anemia.  FOBT positive.   HPI: Patricia [name] is a 42 y.o. female.  PMH ESRD.  She had been on hemodialysis for about a year but began peritoneal dialysis in spring 2019.  GERD.  Depression, anxiety.  Black stools and anemia with inpatient evaluation by [facility] in May, August, October 2019.  She required blood transfusions for the anemia.  Never seen by [facility] as outpt and no response from their office when she left messages trying to get an appointment.  Therefore her home health nurse contact had made an appointment with Dr. Loletha Carrow for/7/20. 05/2017 EGD.  By Dr. Arta Silence for eval of abdominal pain, melena, anemia.  Noted medium hiatal hernia.  Patchy, moderate inflammation with congestion/edema, erosions, shallow ulcerations in the prepyloric region focal mild inflammation/edema and erythema in the duodenal bulb.  H Pylori serologies:  09/03/2017 EGD by Dr. Otis Brace.  For recurrent melena.  Nonobstructing Schatzki's ring in GE junction.  Small hiatal hernia.  Abnormal esophageal motility.  Mild gastritis in the prepylorus.  Scattered mild inflammation in the duodenal bulb.  Pathology showed benign gastric mucosa, no H. pylori. 09/04/2017 colonoscopy.  By Dr. Therisa Doyne.  Patchy edema/erythema in the sigmoid, transverse and descending colon.  Mucosal friability in the ascending colon with minimal fresh blood in the cecum and a sending colon.  No active bleeding during or after thorough lavage.  A few small mouth diverticula in the sigmoid and descending colon 2 sessile  polyps, 3 to 8 mm in size, removed from the transverse colon.  Polyp pathology: sessile serrated, no high-grade dysplasia or malignancy; no pathology pertaining to the erythematous mucosa.  11/02/16 Capsule endoscopy.  Findings included mild gastritis and duodenitis.  Nonspecific focal nodular area in the very distal small bowel.  "Obscure GI bleed, no source of anemia seen on capsule endoscopy; anemia source likely multifactorial from ESRD and other factors. (recommend) Hematology outpatient consult for further evaluation of anemia"  At least 7 CT scans of the abdomen/pelvis since 2013 for evaluation of abdominal pain. 06/02/2017 CT renal stone study for abdominal pain, melena.  Trace ascites and associated with free air from PD.  Scattered diverticulosis of the colon.  Periumbilical fat-containing hernia. 08/15/17 CT abdomen pelvis with contrast for abdominal pain.  Showed small umbilical/supraumbilical fat-containing hernias small intraperitoneal gas secondary to PD.  PD catheter in place.  Increased stool burden.  Scattered colonic diverticulosis.  Trace pericolonic edema at the left paracolic gutter, may reflect stigmata of PD, possibly uncomplicated left-sided diverticulitis.  Gallbladder sludge, possible tiny gallbladder stone. Patient receives Epogen through her kidney doctors.  Hgb fluctuates a bit but baseline looks to be 8 -9 going back to October.  Hgb is low at 6.9 on 10/28/2017 Patient spent a day in the hospital 12/14 -05/19/2017 for symptomatic anemia, Hgb 8.1, she  received 1 U PRBC and Hgb 8.4 at discharge.  She was having dark stools.  After discharge she had brown stool but 2 days ago began having dark, loose stools.  No obvious blood.  Recurrent dizziness.  Came back to the ED where Hgb 8.1 but quickly dropped to 7.6 in the ED.  Abdominal pain, which has no correlation with the color of her stools and is chronic, worked up with a contrasted CT scan which is normal other than presence of  peritoneal dialysis, post hysterectomy,  Peritoneal dialysis fluid studies mentioned but not ordered. WBCs normal.  No fever.  No tachycardia, no hypotension. At admission 12/18 Hgb 8.1 >> 7.6 within 4 hours >> 9.2 this AM after 2 U PRBCs.   MCV 90.    Receives Epogen through her renal doctors.  I do not see documentation of Feraheme infusions but if these are done at dialysis it would not be listed.  She is compliant with once daily Protonix.  No aspirin or NSAIDs.  Not taking oral iron. Use NSAIDs.  Does not drink alcohol.  Patient has not been referred to hematology  Past Medical History:  Diagnosis Date  . Acid reflux   . Anxiety   . Arthritis   . Dyspnea   . Dysrhythmia    tachycardia  . ESRD (end stage renal disease) ([place])    [place]SAT- [place]  . GI bleeding 12/2017  . Gout   . Headache(784.0)    "related to chemo; sometimes weekly" (09/12/2013)  . High cholesterol   . History of blood transfusion "a couple"   "related to low counts"  . Hypertension   . Iron deficiency anemia    "get epogen shots q month" (02/20/2014)  . 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 Peritoneal dialysis status [facility])     Past Surgical History:  Procedure Laterality Date  . ABDOMINAL HYSTERECTOMY  2010   "laparoscopic"  . ANKLE FRACTURE SURGERY Right 1994  . AV FISTULA PLACEMENT Left 04/14/2016   Procedure: ARTERIOVENOUS (AV) FISTULA CREATION;  Surgeon: Angelia Mould, MD;  Location: [facility] OR;  Service: Vascular;  Laterality: Left;  . AV FISTULA PLACEMENT Left 08/09/2017   Procedure: INSERTION OF ARTERIOVENOUS (AV) GORE-TEX GRAFT LEFT ARM;  Surgeon: Elam Dutch, MD;  Location: [place];  Service: Vascular;  Laterality: Left;  . [place] REMOVAL Left 08/11/2017   Procedure: REMOVAL OF ARTERIOVENOUS GORETEX GRAFT ([place]);  Surgeon: Serafina Mitchell, MD;  Location: [facility] OR;  Service: Vascular;  Laterality: Left;  . BIOPSY  09/03/2017    Procedure: BIOPSY;  Surgeon: Otis Brace, MD;  Location: [place];  Service: Gastroenterology;;  . CARDIAC CATHETERIZATION  2000's  . COLONOSCOPY WITH PROPOFOL N/A 09/04/2017   Procedure: COLONOSCOPY WITH PROPOFOL;  Surgeon: Ronnette Juniper, MD;  Location: [place];  Service: Gastroenterology;  Laterality: N/A;  . ESOPHAGOGASTRODUODENOSCOPY    . ESOPHAGOGASTRODUODENOSCOPY (EGD) WITH PROPOFOL Left 06/04/2017   Procedure: ESOPHAGOGASTRODUODENOSCOPY (EGD) WITH PROPOFOL;  Surgeon: Arta Silence, MD;  Location: [place];  Service: Endoscopy;  Laterality: Left;  . ESOPHAGOGASTRODUODENOSCOPY (EGD) WITH PROPOFOL N/A 09/03/2017   Procedure: ESOPHAGOGASTRODUODENOSCOPY (EGD) WITH PROPOFOL;  Surgeon: Otis Brace, MD;  Location: [place];  Service: Gastroenterology;  Laterality: N/A;  . FISTULA SUPERFICIALIZATION Left 04/02/2017   Procedure: FISTULA SUPERFICIALIZATION LEFT ARM;  Surgeon: Serafina Mitchell, MD;  Location: [place];  Service: Vascular;  Laterality: Left;  . FISTULOGRAM Left 04/07/2016  Procedure: FISTULOGRAM;  Surgeon: Waynetta Sandy, MD;  Location: [place];  Service: Vascular;  Laterality: Left;  . FRACTURE SURGERY     right ankle  . GIVENS CAPSULE STUDY N/A 10/29/2017   Procedure: GIVENS CAPSULE STUDY;  Surgeon: Wilford Corner, MD;  Location: [place];  Service: Endoscopy;  Laterality: N/A;  . INSERTION OF DIALYSIS CATHETER Right 04/07/2016   Procedure: INSERTION OF DIALYSIS CATHETER;  Surgeon: Waynetta Sandy, MD;  Location: [place];  Service: Vascular;  Laterality: Right;  . INSERTION OF DIALYSIS CATHETER Right 04/04/2017   Procedure: INSERTION OF DIALYSIS CATHETER;  Surgeon: Waynetta Sandy, MD;  Location: [place];  Service: Vascular;  Laterality: Right;  . LIGATION OF ARTERIOVENOUS  FISTULA Left 04/14/2016   Procedure: LIGATION OF ARTERIOVENOUS  FISTULA;  Surgeon: Angelia Mould, MD;  Location: [place];  Service: Vascular;  Laterality:  Left;  . PATCH ANGIOPLASTY Left 06/19/2016   Procedure: PATCH ANGIOPLASTY LEFT ARTERIOVENOUS FISTULA;  Surgeon: Angelia Mould, MD;  Location: [place];  Service: Vascular;  Laterality: Left;  . POLYPECTOMY  09/04/2017   Procedure: POLYPECTOMY;  Surgeon: Ronnette Juniper, MD;  Location: [facility] ENDOSCOPY;  Service: Gastroenterology;;  . REVISON OF ARTERIOVENOUS FISTULA Left 06/19/2016   Procedure: REVISION SUPERFICIALIZATION OF BRACHIOCEPHALIC ARTERIOVENOUS FISTULA;  Surgeon: Angelia Mould, MD;  Location: [place];  Service: Vascular;  Laterality: Left;    Prior to Admission medications   Medication Sig Start Date End Date Taking? Authorizing Provider  allopurinol (ZYLOPRIM) 100 MG tablet Take 100 mg by mouth at bedtime.    Yes [provider]  ALPRAZolam Duanne Moron) 1 MG tablet Take 1 mg by mouth 2 (two) times daily as needed for anxiety.  03/19/17  Yes [provider]  aspirin 81 MG chewable tablet Chew 1 tablet (81 mg total) by mouth daily. Patient taking differently: Chew 81 mg by mouth at bedtime.  10/03/13  Yes Eugenie Filler, MD  atorvastatin (LIPITOR) 40 MG tablet Take 40 mg by mouth at bedtime.  03/14/14  Yes [provider]  calcitRIOL (ROCALTROL) 0.5 MCG capsule Take 1 capsule (0.5 mcg total) by mouth daily with breakfast. Patient taking differently: Take 0.5 mcg by mouth at bedtime.  06/04/17  Yes Bonnielee Haff, MD  cinacalcet (SENSIPAR) 30 MG tablet Take 1 tablet (30 mg total) by mouth daily. Patient taking differently: Take 30 mg by mouth at bedtime.  08/19/17  Yes Shelly Coss, MD  diphenhydramine-acetaminophen (TYLENOL PM) 25-500 MG TABS tablet Take 2 tablets by mouth at bedtime as needed (sleep).    Yes [provider]  metoprolol tartrate (LOPRESSOR) 25 MG tablet Take 25 mg by mouth at bedtime.  04/17/16  Yes [provider]  multivitamin (RENA-VIT) TABS tablet Take 1 tablet by mouth at bedtime.    Yes [provider]    ondansetron (ZOFRAN) 8 MG tablet Take 8 mg by mouth every 8 (eight) hours as needed for nausea or vomiting.   Yes [provider]  pantoprazole (PROTONIX) 40 MG tablet Take 1 tablet (40 mg total) by mouth daily. Patient taking differently: Take 40 mg by mouth at bedtime.  09/04/17  Yes Shelly Coss, MD  promethazine (PHENERGAN) 25 MG tablet Take 25 mg by mouth every 6 (six) hours as needed for nausea or vomiting.   Yes [provider]  senna (SENOKOT) 8.6 MG TABS tablet Take 2 tablets (17.2 mg total) by mouth at bedtime. 04/15/16  Yes Nita Sells, MD  sucroferric oxyhydroxide (VELPHORO) 500  MG chewable tablet Chew 1,000 mg by mouth 3 (three) times daily with meals.    Yes [provider]  venlafaxine XR (EFFEXOR-XR) 75 MG 24 hr capsule Take 75 mg by mouth at bedtime.    Yes [provider]    Scheduled Meds: . allopurinol  100 mg Oral QHS  . atorvastatin  40 mg Oral QHS  . calcitRIOL  0.5 mcg Oral QHS  . cinacalcet  30 mg Oral Q supper  . metoprolol tartrate  25 mg Oral QHS  . multivitamin  1 tablet Oral QHS  . pantoprazole (PROTONIX) IV  40 mg Intravenous Q12H  . sucroferric oxyhydroxide  1,000 mg Oral TID WC  . venlafaxine XR  75 mg Oral QHS   Infusions:  PRN Meds: acetaminophen **OR** acetaminophen, ALPRAZolam, camphor-menthol, ondansetron **OR** ondansetron (ZOFRAN) IV   Allergies as of 12/22/2017 - Review Complete 12/22/2017  Allergen Reaction Noted  . Nsaids Other (See Comments) 01/27/2013  . Tolmetin Other (See Comments) 07/20/2015    Family History  Problem Relation Age of Onset  . Hypertension Mother   . Thyroid disease Mother   . Coronary artery disease Father   . Hypertension Father   . Diabetes Father     Social History   Socioeconomic History  . Marital status: Single    Spouse name: Not on file  . Number of children: Not on file  . Years of education: Not on file  . Highest education level: Not on file   Occupational History  . Occupation: disabled  Social Needs  . Financial resource strain: Not on file  . Food insecurity:    Worry: Not on file    Inability: Not on file  . Transportation needs:    Medical: Not on file    Non-medical: Not on file  Tobacco Use  . Smoking status: Never Smoker  . Smokeless tobacco: Never Used  Substance and Sexual Activity  . Alcohol use: No  . Drug use: No  . Sexual activity: Not Currently    Birth control/protection: Surgical  Lifestyle  . Physical activity:    Days per week: Not on file    Minutes per session: Not on file  . Stress: Not on file  Relationships  . Social connections:    Talks on phone: Not on file    Gets together: Not on file    Attends religious service: Not on file    Active member of club or organization: Not on file    Attends meetings of clubs or organizations: Not on file    Relationship status: Not on file  . Intimate partner violence:    Fear of current or ex partner: No    Emotionally abused: No    Physically abused: No    Forced sexual activity: No  Other Topics Concern  . Not on file  Social History Narrative  . Not on file    REVIEW OF SYSTEMS: Constitutional: Weakness, fatigue. ENT:  No nose bleeds Pulm: No cough.  Mild dyspnea on exertion. CV:  No palpitations, no LE edema.  Chest pain GU:  No hematuria, no frequency GI: No dysphagia.  No constipation. Heme: Per HPI.  Patient denies excessive or unusual bleeding other than the black stools. Transfusions: Multiple occasions this year. Neuro:  No headaches, no peripheral tingling or numbness Derm:  No itching, no rash or sores.  Endocrine:  No sweats or chills.  No polyuria or dysuria Immunization: Not queried Travel:  None beyond local  counties in last few months.    PHYSICAL EXAM: Vital signs in last 24 hours: Vitals:   12/23/17 0800 12/23/17 0854  BP: 136/64 (!) 156/76  Pulse: 74 72  Resp: (!) 22 19  Temp:  97.8 F (36.6 C)  SpO2: 98%  99%   Wt Readings from Last 3 Encounters:  12/22/17 115.2 kg  12/19/17 118.2 kg  10/30/17 113.9 kg    General: Obese, unwell appearing but not acutely ill looking, lethargic AAF Head: No facial asymmetry or swelling.  No signs of head trauma. Eyes: Scleral icterus.  No conjunctival pallor. Ears: Not hard of hearing Nose: No congestion, no discharge. Mouth: Oral mucosa pink, moist, clear.  Tongue midline. Neck: No JVD, no masses, no thyromegaly. Lungs: Good breath sounds.  No dyspnea, no cough.  Clear bilaterally. Heart: RRR.  No MRG.  S1, S2 present Abdomen: Soft.  Nontender, nondistended.  PD access site covered with a bandage which was not removed for exam.  Hypoactive bowel sounds but no tinkling or tympanitic bowel sounds.  No bruits.  Small umbilical hernia..   Rectal: No stool in the vault, no blood, no masses. Musc/Skeltl: No joint swelling, redness, deformity Extremities: No CCE. Neurologic: Lethargic/somnolent but arousable.  Oriented x3.  Good historian.  No tremors, no limb weakness. Skin: Some sores on her face. Nodes: No cervical adenopathy Psych: Pleasant, cooperative.  Not anxious  Intake/Output from previous day: 12/18 0701 - 12/19 0700 In: 1393.5 [I.V.:448.5; Blood:945] Out: -  Intake/Output this shift: No intake/output data recorded.  LAB RESULTS: Recent Labs    12/22/17 2100 12/23/17 0055 12/23/17 0730  WBC 6.3 6.8 6.4  HGB 8.1* 7.6* 9.2*  HCT 25.2* 24.2* 29.0*  PLT 267 230 215   BMET Lab Results  Component Value Date   NA 138 12/23/2017   NA 142 12/22/2017   NA 137 12/19/2017   K 4.2 12/23/2017   K 4.2 12/22/2017   K 3.4 (L) 12/19/2017   CL 99 12/23/2017   CL 101 12/22/2017   CL 94 (L) 12/19/2017   CO2 19 (L) 12/23/2017   CO2 19 (L) 12/22/2017   CO2 22 12/19/2017   GLUCOSE 89 12/23/2017   GLUCOSE 88 12/22/2017   GLUCOSE 95 12/19/2017   BUN 70 (H) 12/23/2017   BUN 66 (H) 12/22/2017   BUN 62 (H) 12/19/2017   CREATININE 17.52 (H)  12/23/2017   CREATININE 17.18 (H) 12/22/2017   CREATININE 15.55 (H) 12/19/2017   CALCIUM 7.6 (L) 12/23/2017   CALCIUM 8.0 (L) 12/22/2017   CALCIUM 7.9 (L) 12/19/2017   LFT Recent Labs    12/22/17 2024  PROT 7.0  ALBUMIN 3.5  AST 29  ALT 20  ALKPHOS 65  BILITOT 0.7   PT/INR Lab Results  Component Value Date   INR 0.98 12/22/2017   INR 1.04 04/14/2016   INR 0.98 06/18/2015   Hepatitis Panel No results for input(s): HEPBSAG, HCVAB, HEPAIGM, HEPBIGM in the last 72 hours. C-Diff No components found for: CDIFF Lipase     Component Value Date/Time   LIPASE 30 01/31/2016 0921    Drugs of Abuse     Component Value Date/Time   LABOPIA NONE DETECTED 01/19/2015 0145   COCAINSCRNUR NONE DETECTED 01/19/2015 0145   LABBENZ NONE DETECTED 01/19/2015 0145   AMPHETMU NONE DETECTED 01/19/2015 0145   THCU NONE DETECTED 01/19/2015 0145   LABBARB NONE DETECTED 01/19/2015 0145     RADIOLOGY STUDIES: Ct Abdomen Pelvis W Contrast  Result Date: 12/23/2017 CLINICAL  DATA:  Generalized abdominal pain EXAM: CT ABDOMEN AND PELVIS WITH CONTRAST TECHNIQUE: Multidetector CT imaging of the abdomen and pelvis was performed using the standard protocol following bolus administration of intravenous contrast. CONTRAST:  125mL OMNIPAQUE IOHEXOL 300 MG/ML  SOLN COMPARISON:  CT abdomen pelvis 08/15/2017 FINDINGS: LOWER CHEST: There is no basilar pleural or apical pericardial effusion. HEPATOBILIARY: The hepatic contours and density are normal. There is no intra- or extrahepatic biliary dilatation. The gallbladder is normal. PANCREAS: The pancreatic parenchymal contours are normal and there is no ductal dilatation. There is no peripancreatic fluid collection. SPLEEN: Normal. ADRENALS/URINARY TRACT: --Adrenal glands: Normal. --Right kidney/ureter: No hydronephrosis, nephroureterolithiasis, perinephric stranding or solid renal mass. --Left kidney/ureter: No hydronephrosis, nephroureterolithiasis, perinephric  stranding or solid renal mass. --Urinary bladder: Normal for degree of distention STOMACH/BOWEL: --Stomach/Duodenum: There is no hiatal hernia or other gastric abnormality. The duodenal course and caliber are normal. --Small bowel: No dilatation or inflammation. --Colon: No focal abnormality. --Appendix: Normal. VASCULAR/LYMPHATIC: Normal course and caliber of the major abdominal vessels. No abdominal or pelvic lymphadenopathy. REPRODUCTIVE: Status post hysterectomy. No adnexal mass. MUSCULOSKELETAL. No bony spinal canal stenosis or focal osseous abnormality. OTHER: Peritoneal dialysis catheter coiled in the low pelvis. Unchanged fat containing umbilical and supraumbilical hernias. IMPRESSION: No acute abdominal or pelvic abnormality. Electronically Signed   By: Ulyses Jarred M.D.   On: 12/23/2017 04:34     IMPRESSION:   *    Anemia, metastatic.  Chronic.  *     FOBT positive, hard stools intermittent recurrent.  Multiple work-ups beginning May 2019 with findings as above including gastritis, duodenitis for which she is taking Protonix.  Colon polyps and nonspecific colonic erythema (latter not biopsied).  Nodular small bowel mucosa in the ileum on capsule endoscopy..  *     ESRD.  On peritoneal dialysis.  *      Abdominal pain.  Chronic.  Multiple CTs over the years for this and current CT unremarkable.    PLAN:     *    Mall bowel enteroscopy tomorrow.  She can eat today so I ordered a renal diet.   Azucena Freed  12/23/2017, 10:18 AM Phone [phone]

## 2017-12-23 NOTE — Progress Notes (Signed)
Peritoneal dialysis at bedside

## 2017-12-23 NOTE — Consult Note (Addendum)
[facility] Renal Consultation Note    Indication for Consultation:  Management of ESRD/hemodialysis; anemia, hypertension/volume and secondary hyperparathyroidism  Primary nephrologist: Deterding   HPI: Patricia [name] is a 42 y.o. 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 HD initiated 04/2016 then transitioned to CCPD in 05/2017.  Recurrent hospital admissions and significant GI workup since May with symptomatic anemia and melena. Most recent EGD/colonoscopy in August - mild gastritis/small diverticula/polyps. Capsule endoscopy in October with mild gastritis and duodenitis.   Admitted now under observation status for another episode of symptomatic anemia/melena. Reports weakness/fatigue x 1 week. Outpatient hemoglobin has been trending down.  On 11/5 Hgb 9.2 >> 7.4 on 12/3 with Tsat 64% Ferritin 1219. She received Mircera 225 mcg on 12/11. Presented to ED 12/14 for transfusion -got 1 U PRBC. Repeat Hgb 8.4 on 12/15.   Presented to ED this am with abd pain, fatigue, dark stools. FOBT positive. Hgb 7.6. Transfused 2 U prbcs and admitted. Abd CT with no acute findings. GI consulted - for enteroscopy tomorrow.   Seen and examined at bedside. Biggest complaint is fatigue. Has some lower abd pain. Tolerating PO. No BM today. Denies fevers, chills, vomiting, diarrhea. Doing well with PD. No cloudy effluent. Says she has outpatient heme appt in January.   Past Medical History:  Diagnosis Date  . Acid reflux   . Anxiety   . Arthritis   . Dyspnea   . Dysrhythmia    tachycardia  . ESRD (end stage renal disease) ([place])    [place]- [place]  . GI bleeding 12/2017  . Gout   . Headache(784.0)    "related to chemo; sometimes weekly" (09/12/2013)  . High cholesterol   . History of blood transfusion "a couple"   "related to low counts"  . Hypertension   . Iron deficiency anemia    "get epogen shots q month" (02/20/2014)  . MCGN (minimal change glomerulonephritis)    "using chemo to tx;  finished my  last tx in 12/2013"  . Peritoneal dialysis status [facility])    Past Surgical History:  Procedure Laterality Date  . ABDOMINAL HYSTERECTOMY  2010   "laparoscopic"  . ANKLE FRACTURE SURGERY Right 1994  . AV FISTULA PLACEMENT Left 04/14/2016   Procedure: ARTERIOVENOUS (AV) FISTULA CREATION;  Surgeon: Angelia Mould, MD;  Location: [facility] OR;  Service: Vascular;  Laterality: Left;  . AV FISTULA PLACEMENT Left 08/09/2017   Procedure: INSERTION OF ARTERIOVENOUS (AV) GORE-TEX GRAFT LEFT ARM;  Surgeon: Elam Dutch, MD;  Location: [place];  Service: Vascular;  Laterality: Left;  . [place] REMOVAL Left 08/11/2017   Procedure: REMOVAL OF ARTERIOVENOUS GORETEX GRAFT ([place]);  Surgeon: Serafina Mitchell, MD;  Location: [facility] OR;  Service: Vascular;  Laterality: Left;  . BIOPSY  09/03/2017   Procedure: BIOPSY;  Surgeon: Otis Brace, MD;  Location: [place];  Service: Gastroenterology;;  . CARDIAC CATHETERIZATION  2000's  . COLONOSCOPY WITH PROPOFOL N/A 09/04/2017   Procedure: COLONOSCOPY WITH PROPOFOL;  Surgeon: Ronnette Juniper, MD;  Location: [place];  Service: Gastroenterology;  Laterality: N/A;  . ESOPHAGOGASTRODUODENOSCOPY    . ESOPHAGOGASTRODUODENOSCOPY (EGD) WITH PROPOFOL Left 06/04/2017   Procedure: ESOPHAGOGASTRODUODENOSCOPY (EGD) WITH PROPOFOL;  Surgeon: Arta Silence, MD;  Location: [place];  Service: Endoscopy;  Laterality: Left;  . ESOPHAGOGASTRODUODENOSCOPY (EGD) WITH PROPOFOL N/A 09/03/2017   Procedure: ESOPHAGOGASTRODUODENOSCOPY (EGD) WITH PROPOFOL;  Surgeon: Otis Brace, MD;  Location: [place];  Service: Gastroenterology;  Laterality: N/A;  . FISTULA SUPERFICIALIZATION Left 04/02/2017   Procedure: FISTULA SUPERFICIALIZATION LEFT  ARM;  Surgeon: Serafina Mitchell, MD;  Location: [facility] OR;  Service: Vascular;  Laterality: Left;  . FISTULOGRAM Left 04/07/2016   Procedure: FISTULOGRAM;  Surgeon: Waynetta Sandy, MD;  Location: [place];  Service: Vascular;  Laterality: Left;  .  FRACTURE SURGERY     right ankle  . GIVENS CAPSULE STUDY N/A 10/29/2017   Procedure: GIVENS CAPSULE STUDY;  Surgeon: Wilford Corner, MD;  Location: [place];  Service: Endoscopy;  Laterality: N/A;  . INSERTION OF DIALYSIS CATHETER Right 04/07/2016   Procedure: INSERTION OF DIALYSIS CATHETER;  Surgeon: Waynetta Sandy, MD;  Location: [place];  Service: Vascular;  Laterality: Right;  . INSERTION OF DIALYSIS CATHETER Right 04/04/2017   Procedure: INSERTION OF DIALYSIS CATHETER;  Surgeon: Waynetta Sandy, MD;  Location: [place];  Service: Vascular;  Laterality: Right;  . LIGATION OF ARTERIOVENOUS  FISTULA Left 04/14/2016   Procedure: LIGATION OF ARTERIOVENOUS  FISTULA;  Surgeon: Angelia Mould, MD;  Location: [place];  Service: Vascular;  Laterality: Left;  . PATCH ANGIOPLASTY Left 06/19/2016   Procedure: PATCH ANGIOPLASTY LEFT ARTERIOVENOUS FISTULA;  Surgeon: Angelia Mould, MD;  Location: [place];  Service: Vascular;  Laterality: Left;  . POLYPECTOMY  09/04/2017   Procedure: POLYPECTOMY;  Surgeon: Ronnette Juniper, MD;  Location: [facility] ENDOSCOPY;  Service: Gastroenterology;;  . REVISON OF ARTERIOVENOUS FISTULA Left 06/19/2016   Procedure: REVISION SUPERFICIALIZATION OF BRACHIOCEPHALIC ARTERIOVENOUS FISTULA;  Surgeon: Angelia Mould, MD;  Location: [facility] OR;  Service: Vascular;  Laterality: Left;   Family History  Problem Relation Age of Onset  . Hypertension Mother   . Thyroid disease Mother   . Coronary artery disease Father   . Hypertension Father   . Diabetes Father    Social History:  reports that she has never smoked. She has never used smokeless tobacco. She reports that she does not drink alcohol or use drugs. Allergies  Allergen Reactions  . Nsaids Other (See Comments)    Cannot take due to Kidney disease/Kidney function   . Tolmetin Other (See Comments)    Cannot take due to Kidney Disease   Prior to Admission medications   Medication Sig Start Date  End Date Taking? Authorizing Provider  allopurinol (ZYLOPRIM) 100 MG tablet Take 100 mg by mouth at bedtime.    Yes [provider]  ALPRAZolam Duanne Moron) 1 MG tablet Take 1 mg by mouth 2 (two) times daily as needed for anxiety.  03/19/17  Yes [provider]  aspirin 81 MG chewable tablet Chew 1 tablet (81 mg total) by mouth daily. Patient taking differently: Chew 81 mg by mouth at bedtime.  10/03/13  Yes Eugenie Filler, MD  atorvastatin (LIPITOR) 40 MG tablet Take 40 mg by mouth at bedtime.  03/14/14  Yes [provider]  calcitRIOL (ROCALTROL) 0.5 MCG capsule Take 1 capsule (0.5 mcg total) by mouth daily with breakfast. Patient taking differently: Take 0.5 mcg by mouth at bedtime.  06/04/17  Yes Bonnielee Haff, MD  cinacalcet (SENSIPAR) 30 MG tablet Take 1 tablet (30 mg total) by mouth daily. Patient taking differently: Take 30 mg by mouth at bedtime.  08/19/17  Yes Shelly Coss, MD  diphenhydramine-acetaminophen (TYLENOL PM) 25-500 MG TABS tablet Take 2 tablets by mouth at bedtime as needed (sleep).    Yes [provider]  metoprolol tartrate (LOPRESSOR) 25 MG tablet Take 25 mg by mouth at bedtime.  04/17/16  Yes [provider]  multivitamin (RENA-VIT) TABS tablet Take 1 tablet by  mouth at bedtime.    Yes [provider]  ondansetron (ZOFRAN) 8 MG tablet Take 8 mg by mouth every 8 (eight) hours as needed for nausea or vomiting.   Yes [provider]  pantoprazole (PROTONIX) 40 MG tablet Take 1 tablet (40 mg total) by mouth daily. Patient taking differently: Take 40 mg by mouth at bedtime.  09/04/17  Yes Shelly Coss, MD  promethazine (PHENERGAN) 25 MG tablet Take 25 mg by mouth every 6 (six) hours as needed for nausea or vomiting.   Yes [provider]  senna (SENOKOT) 8.6 MG TABS tablet Take 2 tablets (17.2 mg total) by mouth at bedtime. 04/15/16  Yes Nita Sells, MD  sucroferric oxyhydroxide (VELPHORO) 500 MG  chewable tablet Chew 1,000 mg by mouth 3 (three) times daily with meals.    Yes [provider]  venlafaxine XR (EFFEXOR-XR) 75 MG 24 hr capsule Take 75 mg by mouth at bedtime.    Yes [provider]   Current Facility-Administered Medications  Medication Dose Route Frequency Provider Last Rate Last Dose  . acetaminophen (TYLENOL) tablet 650 mg  650 mg Oral Q6H PRN Rise Patience, MD   650 mg at 12/23/17 0444   Or  . acetaminophen (TYLENOL) suppository 650 mg  650 mg Rectal Q6H PRN Rise Patience, MD      . allopurinol (ZYLOPRIM) tablet 100 mg  100 mg Oral QHS Rise Patience, MD      . ALPRAZolam Duanne Moron) tablet 1 mg  1 mg Oral BID PRN Rise Patience, MD   1 mg at 12/23/17 0448  . atorvastatin (LIPITOR) tablet 40 mg  40 mg Oral QHS Rise Patience, MD      . calcitRIOL (ROCALTROL) capsule 0.5 mcg  0.5 mcg Oral QHS Rise Patience, MD      . camphor-menthol [facility]) lotion   Topical PRN Geradine Girt, DO   1 application at 83/38/25 1018  . cinacalcet (SENSIPAR) tablet 30 mg  30 mg Oral Q supper Rise Patience, MD      . dialysis solution 2.5% low-MG/low-CA dianeal solution   Intraperitoneal Q24H Vann, Jessica U, DO      . gentamicin cream (GARAMYCIN) 0.1 % 1 application  1 application Topical Daily Ejigiri, Thomos Lemons, PA-C      . heparin 2,500 Units in dialysis solution 2.5% low-MG/low-CA 5,000 mL dialysis solution   Peritoneal Dialysis PRN Eulogio Bear U, DO      . metoprolol tartrate (LOPRESSOR) tablet 25 mg  25 mg Oral QHS Rise Patience, MD      . multivitamin (RENA-VIT) tablet 1 tablet  1 tablet Oral QHS Rise Patience, MD      . ondansetron [facility]) tablet 4 mg  4 mg Oral Q6H PRN Rise Patience, MD       Or  . ondansetron [facility]) injection 4 mg  4 mg Intravenous Q6H PRN Rise Patience, MD      . pantoprazole (PROTONIX) injection 40 mg  40 mg Intravenous Q12H Rise Patience, MD   40 mg at 12/23/17  0539  . sucroferric oxyhydroxide (VELPHORO) chewable tablet 1,000 mg  1,000 mg Oral TID WC Rise Patience, MD   1,000 mg at 12/23/17 1229  . venlafaxine XR (EFFEXOR-XR) 24 hr capsule 75 mg  75 mg Oral QHS Rise Patience, MD        ROS: As per HPI otherwise negative.  Physical Exam: Vitals:  12/23/17 0742 12/23/17 0800 12/23/17 0854 12/23/17 1222  BP: (!) 141/72 136/64 (!) 156/76 (!) 151/97  Pulse: 72 74 72 65  Resp: 16 (!) $Remo'22 19 18  'DxPsw$ Temp:   97.8 F (36.6 C)   TempSrc:   Oral   SpO2: 99% 98% 99% 97%  Weight:      Height:         General: Obese female NAD  Head: NCAT sclera not icteric MMM Neck: Supple. No JVD No masses Lungs: CTA bilaterally without wheezes, rales, or rhonchi. Breathing is unlabored. Heart: RRR with S1 S2 Abdomen: mild LQ tenderness. No guarding, no rebound tenderness  Lower extremities:without edema or ischemic changes, no open wounds  Neuro: A & O  X 3. Moves all extremities spontaneously. Psych:  Responds to questions appropriately with a normal affect. Dialysis Access: PD cath; dsg clean   Labs: Basic Metabolic Panel: Recent Labs  Lab 12/18/17 1836 12/19/17 0553 12/22/17 2024 12/23/17 0730  NA 139 137 142 138  K 3.3* 3.4* 4.2 4.2  CL 94* 94* 101 99  CO2 23 22 19* 19*  GLUCOSE 113* 95 88 89  BUN 64* 62* 66* 70*  CREATININE 16.30* 15.55* 17.18* 17.52*  CALCIUM 7.9* 7.9* 8.0* 7.6*  PHOS 3.5 4.8*  --   --    Liver Function Tests: Recent Labs  Lab 12/18/17 1836 12/19/17 0553 12/22/17 2024  AST  --   --  29  ALT  --   --  20  ALKPHOS  --   --  65  BILITOT  --   --  0.7  PROT  --   --  7.0  ALBUMIN 3.3* 3.2* 3.5   No results for input(s): LIPASE, AMYLASE in the last 168 hours. No results for input(s): AMMONIA in the last 168 hours. CBC: Recent Labs  Lab 12/18/17 1836 12/19/17 0553 12/22/17 2100 12/23/17 0055 12/23/17 0730  WBC 7.0 7.3 6.3 6.8 6.4  NEUTROABS  --   --  3.6  --   --   HGB 8.1* 8.4* 8.1* 7.6* 9.2*  HCT  26.0* 26.1* 25.2* 24.2* 29.0*  MCV 92.5 90.3 93.0 92.4 90.6  PLT 306 264 267 230 215   Cardiac Enzymes: No results for input(s): CKTOTAL, CKMB, CKMBINDEX, TROPONINI in the last 168 hours. CBG: No results for input(s): GLUCAP in the last 168 hours. Iron Studies: No results for input(s): IRON, TIBC, TRANSFERRIN, FERRITIN in the last 72 hours. Studies/Results: Ct Abdomen Pelvis W Contrast  Result Date: 12/23/2017 CLINICAL DATA:  Generalized abdominal pain EXAM: CT ABDOMEN AND PELVIS WITH CONTRAST TECHNIQUE: Multidetector CT imaging of the abdomen and pelvis was performed using the standard protocol following bolus administration of intravenous contrast. CONTRAST:  156mL OMNIPAQUE IOHEXOL 300 MG/ML  SOLN COMPARISON:  CT abdomen pelvis 08/15/2017 FINDINGS: LOWER CHEST: There is no basilar pleural or apical pericardial effusion. HEPATOBILIARY: The hepatic contours and density are normal. There is no intra- or extrahepatic biliary dilatation. The gallbladder is normal. PANCREAS: The pancreatic parenchymal contours are normal and there is no ductal dilatation. There is no peripancreatic fluid collection. SPLEEN: Normal. ADRENALS/URINARY TRACT: --Adrenal glands: Normal. --Right kidney/ureter: No hydronephrosis, nephroureterolithiasis, perinephric stranding or solid renal mass. --Left kidney/ureter: No hydronephrosis, nephroureterolithiasis, perinephric stranding or solid renal mass. --Urinary bladder: Normal for degree of distention STOMACH/BOWEL: --Stomach/Duodenum: There is no hiatal hernia or other gastric abnormality. The duodenal course and caliber are normal. --Small bowel: No dilatation or inflammation. --Colon: No focal abnormality. --Appendix: Normal. VASCULAR/LYMPHATIC: Normal course and  caliber of the major abdominal vessels. No abdominal or pelvic lymphadenopathy. REPRODUCTIVE: Status post hysterectomy. No adnexal mass. MUSCULOSKELETAL. No bony spinal canal stenosis or focal osseous abnormality.  OTHER: Peritoneal dialysis catheter coiled in the low pelvis. Unchanged fat containing umbilical and supraumbilical hernias. IMPRESSION: No acute abdominal or pelvic abnormality. Electronically Signed   By: Ulyses Jarred M.D.   On: 12/23/2017 04:34    Dialysis Orders:  CCPD, 7X Week, EDW 108.5 (kg) Ca 2.5 (mEq/L) Mg 0.5 (mEq/L) Dextrose 1.5; 2.5; 4.25 %, # Exchanges 5, fill vol 3000 mL, Dwell Time 2 hrs 0 min, last fill vol 3000 mL, Day Exchanges 1, daytime fill vol 3000 mL, Day Dwell Time 4 hrs 0 min   Assessment/Plan: 1. Symptomatic anemia/melena.  Recurrent admissions with similar presentation. Previous GI workup has been unremarkable. Hgb 7.6> 9.2 S/p 2 units prbcs today. GI following. For small bowel enteroscopy tomorrow.  2. ESRD -  Continue CCPD per rx.  3. Hypertension/volume  - BP ok. Volume stable- no gross overload on exam. Using 2.5% sol on PD.   4. Anemia  - As above. Mircera 225 dosed 12/11. On Velphoro binder which can cause dark stools No Fe deficiency when last tested.  5.  Metabolic bone disease -  Continue VDRA/binders. On Sensipar. Follow labs.  6.  Nutrition - Renal diet/vitamins when eating.   Patricia Child PA-C [facility] Pager [phone] 12/23/2017, 2:14 PM   Pt seen, examined, agree w assess/plan as above with additions as indicated.  Kelly Splinter MD [employer] pager [phone]    cell [phone] 12/24/2017, 8:34 AM

## 2017-12-23 NOTE — ED Provider Notes (Addendum)
12:00 AM  Assumed care from Dr. Ronnald Nian.  Patient is a 42 year old female with history of end-stage renal disease, chronic anemia who presents to the emergency department with dark stools and hematochezia.  Was found to be guaiac positive here.  Has hemorrhoids on exam.  Recently admitted for the same and had endoscopy, colonoscopy that showed gastritis, duodenitis and diverticulosis in August and capsule endoscopy in October that only showed mild gastritis and duodenitis and no active bleeding.  Was recently transfused 1 unit of packed red blood cells (went from 8.1 to 8.4).  Today here has small amount of blood on exam but no hemorrhage.  Hemoglobin is 8.1 today.  Hospitalist consulted.  Plan is to repeat CBC at 1 AM to ensure no significant drop in hemoglobin.  If this is unremarkable, patient is comfortable with plan for discharge home with close outpatient follow-up with her PCP and gastroenterologist.  Patient on iron and protonix.   1:35 AM  Pt now complaining of diffuse lower abdominal pain and requesting a CT scan.  She is mildly tender throughout her abdomen.  Will perform CT imaging for further evaluation but she has a nonsurgical abdomen currently.  Has history of diverticulosis.  Could have diverticulitis today.  Her hemoglobin has dropped to 7.6 and she now reports she has been feeling lightheaded like she might pass out and short of breath.  No chest pain.  Will transfuse 2 units of packed red blood cells and discussed with hospitalist again for admission.  States she had a large bloody bowel movement here in the emergency department.  She states she has not seen hematology as an outpatient and may benefit from seeing them while here in the hospital.  Suspect that her anemia is multifactorial and from slow chronic GI bleed as well as chronic kidney disease.  She has been on dialysis since 2018.   1:49 AM Discussed patient's case with hospitalist, Dr. Hal Hope.  I have recommended admission  and patient (and family if present) agree with this plan. Admitting physician will place admission orders.   I reviewed all nursing notes, vitals, pertinent previous records, EKGs, lab and urine results, imaging (as available).  2:45 AM  Pt began having itching and redness in her right arm after receiving morphine.  Will give IV Benadryl.  I do not think this is from blood administration but will continue to closely monitor.   5:00 AM  CT of abdomen pelvis shows no acute abnormality.    CRITICAL CARE Performed by: Pryor Curia   Total critical care time: 45 minutes  Critical care time was exclusive of separately billable procedures and treating other patients.  Critical care was necessary to treat or prevent imminent or life-threatening deterioration.  Critical care was time spent personally by me on the following activities: development of treatment plan with patient and/or surrogate as well as nursing, discussions with consultants, evaluation of patient's response to treatment, examination of patient, obtaining history from patient or surrogate, ordering and performing treatments and interventions, ordering and review of laboratory studies, ordering and review of radiographic studies, pulse oximetry and re-evaluation of patient's condition.     Date: 12/23/2017 2:42 AM  Rate: 84  Rhythm: normal sinus rhythm  QRS Axis: normal  Intervals: normal  ST/T Wave abnormalities: normal  Conduction Disutrbances: none  Narrative Interpretation: unremarkable       Shenae Bonanno, Delice Bison, DO 12/23/17 0331    Yandriel Boening, Delice Bison, DO 12/23/17 0501

## 2017-12-24 ENCOUNTER — Observation Stay (HOSPITAL_COMMUNITY): Payer: Medicare Other | Admitting: Certified Registered"

## 2017-12-24 ENCOUNTER — Encounter (HOSPITAL_COMMUNITY)
Admission: EM | Disposition: A | Discharge status: Home / Self Care | Payer: Self-pay | Source: Home / Self Care | Attending: Emergency Medicine

## 2017-12-24 ENCOUNTER — Encounter (HOSPITAL_COMMUNITY): Payer: Self-pay

## 2017-12-24 DIAGNOSIS — K3189 Other diseases of stomach and duodenum: Secondary | ICD-10-CM

## 2017-12-24 DIAGNOSIS — K259 Gastric ulcer, unspecified as acute or chronic, without hemorrhage or perforation: Secondary | ICD-10-CM | POA: Diagnosis not present

## 2017-12-24 DIAGNOSIS — K921 Melena: Secondary | ICD-10-CM

## 2017-12-24 DIAGNOSIS — K228 Other specified diseases of esophagus: Secondary | ICD-10-CM | POA: Diagnosis not present

## 2017-12-24 DIAGNOSIS — N186 End stage renal disease: Secondary | ICD-10-CM | POA: Diagnosis not present

## 2017-12-24 DIAGNOSIS — I120 Hypertensive chronic kidney disease with stage 5 chronic kidney disease or end stage renal disease: Secondary | ICD-10-CM | POA: Diagnosis not present

## 2017-12-24 DIAGNOSIS — K922 Gastrointestinal hemorrhage, unspecified: Secondary | ICD-10-CM | POA: Diagnosis not present

## 2017-12-24 DIAGNOSIS — K254 Chronic or unspecified gastric ulcer with hemorrhage: Secondary | ICD-10-CM | POA: Diagnosis not present

## 2017-12-24 DIAGNOSIS — D649 Anemia, unspecified: Secondary | ICD-10-CM | POA: Diagnosis not present

## 2017-12-24 HISTORY — PX: BIOPSY: SHX5522

## 2017-12-24 HISTORY — PX: ENTEROSCOPY: SHX5533

## 2017-12-24 HISTORY — PX: GIVENS CAPSULE STUDY: SHX5432

## 2017-12-24 LAB — BPAM RBC
BLOOD PRODUCT EXPIRATION DATE: 202001062359
Blood Product Expiration Date: 202001072359
ISSUE DATE / TIME: 201912190220
ISSUE DATE / TIME: 201912190416
Unit Type and Rh: 9500
Unit Type and Rh: 9500

## 2017-12-24 LAB — TYPE AND SCREEN
ABO/RH(D): O NEG
Antibody Screen: NEGATIVE
Unit division: 0
Unit division: 0

## 2017-12-24 SURGERY — ENTEROSCOPY
Anesthesia: Monitor Anesthesia Care

## 2017-12-24 MED ORDER — BUTAMBEN-TETRACAINE-BENZOCAINE 2-2-14 % EX AERO
INHALATION_SPRAY | CUTANEOUS | Status: DC | PRN
  Administered 2017-12-24: 2 via TOPICAL

## 2017-12-24 MED ORDER — BIOTENE DRY MOUTH MT LIQD: 15.0000 mL | OROMUCOSAL | Status: DC | PRN

## 2017-12-24 MED ORDER — PROPOFOL 500 MG/50ML IV EMUL
INTRAVENOUS | Status: DC | PRN
  Administered 2017-12-24: 125 ug/kg/min via INTRAVENOUS
  Administered 2017-12-24: 10:00:00 via INTRAVENOUS

## 2017-12-24 MED ORDER — SPOT INK MARKER SYRINGE KIT
PACK | SUBMUCOSAL | Status: DC | PRN
  Administered 2017-12-24: 1 mL via SUBMUCOSAL

## 2017-12-24 MED ORDER — SPOT INK MARKER SYRINGE KIT
PACK | SUBMUCOSAL | Status: AC
  Filled 2017-12-24: qty 5

## 2017-12-24 MED ORDER — SODIUM CHLORIDE BACTERIOSTATIC 0.9 % IJ SOLN
INTRAMUSCULAR | Status: DC | PRN
  Administered 2017-12-24: 1 mL via SUBMUCOSAL

## 2017-12-24 MED ORDER — PROPOFOL 10 MG/ML IV BOLUS
INTRAVENOUS | Status: DC | PRN
  Administered 2017-12-24 (×3): 30 mg via INTRAVENOUS

## 2017-12-24 MED ORDER — GLYCOPYRROLATE 0.2 MG/ML IJ SOLN
INTRAMUSCULAR | Status: DC | PRN
  Administered 2017-12-24: 0.1 mg via INTRAVENOUS

## 2017-12-24 MED ORDER — PANTOPRAZOLE SODIUM 40 MG PO TBEC
40.0000 mg | DELAYED_RELEASE_TABLET | Freq: Two times a day (BID) | ORAL | Status: DC
  Administered 2017-12-24 – 2017-12-25 (×2): 40 mg via ORAL
  Filled 2017-12-24 (×2): qty 1

## 2017-12-24 NOTE — Progress Notes (Signed)
Endo called in reference to pt and wanting to bring her down for her endoscopy. Pt is on a peritoneal dialysis machine and will not be done for another hour or so. Endo advised they would change the pt's appointment time to 0900 and they would be getting the pt at 0800 this date.

## 2017-12-24 NOTE — Interval H&P Note (Signed)
History and Physical Interval Note:  12/24/2017 9:29 AM  Patricia [name]  has presented today for surgery, with the diagnosis of Upper GI bleeding  The various methods of treatment have been discussed with the patient and family. After consideration of risks, benefits and other options for treatment, the patient has consented to  Procedure(s): ENTEROSCOPY (N/A) GIVENS CAPSULE STUDY (N/A) as a surgical intervention .  The patient's history has been reviewed, patient examined, no change in status, stable for surgery.  I have reviewed the patient's chart and labs.  Questions were answered to the patient's satisfaction.     [employer]

## 2017-12-24 NOTE — Anesthesia Procedure Notes (Signed)
Procedure Name: MAC Date/Time: 12/24/2017 9:48 AM Performed by: Lance Coon, CRNA Pre-anesthesia Checklist: Patient identified, Emergency Drugs available, Suction available, Patient being monitored and Timeout performed Patient Re-evaluated:Patient Re-evaluated prior to induction Oxygen Delivery Method: Nasal cannula

## 2017-12-24 NOTE — Op Note (Signed)
[facility] [name] Procedure Date : 12/24/2017 MRN: [id number] Attending MD: Justice Britain , MD Date of Birth: Jan 27, 1975 CSN: [id number] Age: 42 Admit Type: Inpatient Procedure:                Small bowel enteroscopy Indications:              Anemia, Melena, Obscure gastrointestinal bleeding Providers:                Justice Britain, MD, Carlyn Reichert, RN, Charolette Child, Technician, Lance Coon, CRNA Referring MD:             Dr. Eliseo Squires ([facility]) Medicines:                Monitored Anesthesia Care Complications:            No immediate complications. Estimated Blood Loss:     Estimated blood loss was minimal. Procedure:                After obtaining informed consent, the endoscope was                            passed under direct vision. Throughout the                            procedure, the patient's blood pressure, pulse, and                            oxygen saturations were monitored continuously. The                            PCF-H190DL ([id number]) peds colon was introduced                            through the mouth and advanced to the proximal                            jejunum. The GIF-H190 ([id number]) Olympus Adult EGD                            was introduced through the mouth and advanced to                            the second part of duodenum. The small bowel                            enteroscopy was accomplished without difficulty.                            The patient tolerated the procedure. Scope In: Scope Out: Findings:      No gross lesions were noted in the proximal esophagus, in the mid       esophagus and in the distal esophagus.      The Z-line was irregular and was found 38 cm from the incisors.  One non-bleeding superficial gastric ulcer with a clean ulcer base       (Forrest Class III) was found in the prepyloric region of the stomach.       The lesion was 6 mm in largest  dimension.      Multiple dispersed, small non-bleeding erosions were found in the       gastric antrum and in the prepyloric region of the stomach. There were       no stigmata of recent bleeding.      No other gross lesions were noted in the entire examined stomach. This       was biopsied with a cold forceps for histology and Helicobacter pylori       testing from the antrum/incisura/greater curve/lesser curve.      Patchy erythematous mucosa without active bleeding and with no stigmata       of bleeding was found in the duodenal bulb.      Normal mucosa was found in the second portion of the duodenum, in the       third portion of the duodenum and in the fourth portion of the duodenum.      Normal mucosa was found in the jejunum. Area was tattooed with an       injection of Spot (carbon black) to demarcate the distal extent of the       Push SBE.      Using the adult endoscope, the video capsule enteroscope was advanced       into the first portion of the duodenum. Impression:               - No gross lesions in esophagus. Z-line irregular,                            38 cm from the incisors.                           - Non-bleeding gastric ulcer with a clean ulcer                            base (Forrest Class III). Non-bleeding erosive                            gastropathy. No other gross lesions in the stomach.                            Biopsied for HP.                           - Erythematous duodenopathy in bulb.                           - Normal mucosa was found in the second portion of                            the duodenum, in the third portion of the duodenum                            and in the fourth portion of the duodenum.                           -  Normal mucosa was found in the jejunum. Tattooed                            distal extent to demarcate distal extent of Push                            SBE.                           - Successful completion of the Video  Capsule                            Enteroscope placement. Recommendation:           - The patient will be observed post-procedure,                            until all discharge criteria are met.                           - Return patient to hospital ward for ongoing care.                           - NPO for 2 hours. Clear liquid diet for 2 hours.                            Full liquid diet for 2 hours. Soft diet thereafter                            to Renal Diet (orders placed).                           - Capsule will be picked up over the weekend and                            will be interpreted further by the oncoming                            Inpatient service to determine if any additional                            workup/management will be required for small bowel                            management.                           - Maintain PPI 40 mg twice daily.                           - The findings and recommendations were discussed                            with the patient.                           -  The findings and recommendations were discussed                            with the referring physician. Procedure Code(s):        --- Professional ---                           [phone], Small intestinal endoscopy, enteroscopy                            beyond second portion of duodenum, not including                            ileum; with biopsy, single or multiple                           44799, Unlisted procedure, small intestine Diagnosis Code(s):        --- Professional ---                           K22.8, Other specified diseases of esophagus                           K25.9, Gastric ulcer, unspecified as acute or                            chronic, without hemorrhage or perforation                           K31.89, Other diseases of stomach and duodenum                           D64.9, Anemia, unspecified                           K92.1, Melena (includes  Hematochezia)                           K92.2, Gastrointestinal hemorrhage, unspecified CPT copyright 2018 American Medical Association. All rights reserved. The codes documented in this report are preliminary and upon coder review may  be revised to meet current compliance requirements. Justice Britain, MD 12/24/2017 10:43:59 AM Number of Addenda: 0

## 2017-12-24 NOTE — Progress Notes (Signed)
Progress Note    Serra Younan  ZOX:[id number] DOB: 09-25-1975  DOA: 12/22/2017 PCP: Blair Heys, PA-C    Brief Narrative:     Medical records reviewed and are as summarized below:  Patricia [name] is an 42 y.o. female with history of ESRD on peritoneal dialysis was recently admitted for symptomatic anemia and discharged 4 days ago presents to the ER because of patient noticing increasing dark stools black in color concerning for GI bleed.  S/p small bowel endoscopy  Assessment/Plan:   Principal Problem:   Acute GI bleeding Active Problems:   Morbid obesity ([place])   HTN (hypertension)   PAT (paroxysmal atrial tachycardia) (HCC)   ESRD on peritoneal dialysis ([place])   Symptomatic anemia  ABLA -suspected GI source - had EGD and colonoscopy in August 2019 about 5 months ago which showed duodenitis and gastritis and also erythematous mucosa of the ascending transverse and sigmoid colon with some diverticulosis.   -capsule endoscopy in October which did not show any definite source of bleeding.   -s/p 2 units PRBC -now s/p small bowel enteroscopy with capsule placed -advance diet per protocol  Abdominal pain  -no sign of infection -CT scan unrevealing  Hypertension  -metoprolol.  ESRD  -on peritoneal dialysis  Hyperlipidemia  - statin  obesity Body mass index is 41.88 kg/m.   Family Communication/Anticipated D/C date and plan/Code Status   DVT prophylaxis: scd Code Status: Full Code.  Family Communication:  Disposition Plan: home in AM   Medical Consultants:    GI  renal   Anti-Infectives:    None  Subjective:   On way to endo finishing up PD- no SOB  Objective:    Vitals:   12/24/17 0736 12/24/17 0843 12/24/17 1032 12/24/17 1040  BP: 130/76 (!) 158/90 (!) 160/96 (!) 188/97  Pulse: 71 61 84 83  Resp: $Remo'16 13 15 15  'gagdo$ Temp: 98 F (36.7 C) 98 F (36.7 C) 97.7 F (36.5 C)   TempSrc: Oral Oral Oral   SpO2: 96% 100% 96% 95%  Weight:   121.3 kg    Height:  $Remove'5\' 7"'fZgFVXS$  (1.702 m)      Intake/Output Summary (Last 24 hours) at 12/24/2017 1545 Last data filed at 12/24/2017 1025 Gross per 24 hour  Intake 1200 ml  Output 0 ml  Net 1200 ml   Filed Weights   12/23/17 1740 12/24/17 0451 12/24/17 0843  Weight: 121.3 kg 121.3 kg 121.3 kg    Exam: In bed, NAD Obese female rrr No increased work of breathing, no wheezing A+Ox3  Data Reviewed:   I have personally reviewed following labs and imaging studies:  Labs: Labs show the following:   Basic Metabolic Panel: Recent Labs  Lab 12/18/17 1836 12/19/17 0553 12/22/17 2024 12/23/17 0730  NA 139 137 142 138  K 3.3* 3.4* 4.2 4.2  CL 94* 94* 101 99  CO2 23 22 19* 19*  GLUCOSE 113* 95 88 89  BUN 64* 62* 66* 70*  CREATININE 16.30* 15.55* 17.18* 17.52*  CALCIUM 7.9* 7.9* 8.0* 7.6*  PHOS 3.5 4.8*  --   --    GFR Estimated Creatinine Clearance: 5.5 mL/min (A) (by C-G formula based on SCr of 17.52 mg/dL (H)). Liver Function Tests: Recent Labs  Lab 12/18/17 1836 12/19/17 0553 12/22/17 2024  AST  --   --  29  ALT  --   --  20  ALKPHOS  --   --  65  BILITOT  --   --  0.7  PROT  --   --  7.0  ALBUMIN 3.3* 3.2* 3.5   No results for input(s): LIPASE, AMYLASE in the last 168 hours. No results for input(s): AMMONIA in the last 168 hours. Coagulation profile Recent Labs  Lab 12/22/17 2024  INR 0.98    CBC: Recent Labs  Lab 12/18/17 1836 12/19/17 0553 12/22/17 2100 12/23/17 0055 12/23/17 0730  WBC 7.0 7.3 6.3 6.8 6.4  NEUTROABS  --   --  3.6  --   --   HGB 8.1* 8.4* 8.1* 7.6* 9.2*  HCT 26.0* 26.1* 25.2* 24.2* 29.0*  MCV 92.5 90.3 93.0 92.4 90.6  PLT 306 264 267 230 215   Cardiac Enzymes: No results for input(s): CKTOTAL, CKMB, CKMBINDEX, TROPONINI in the last 168 hours. BNP (last 3 results) No results for input(s): PROBNP in the last 8760 hours. CBG: No results for input(s): GLUCAP in the last 168 hours. D-Dimer: No results for input(s): DDIMER in  the last 72 hours. Hgb A1c: No results for input(s): HGBA1C in the last 72 hours. Lipid Profile: No results for input(s): CHOL, HDL, LDLCALC, TRIG, CHOLHDL, LDLDIRECT in the last 72 hours. Thyroid function studies: No results for input(s): TSH, T4TOTAL, T3FREE, THYROIDAB in the last 72 hours.  Invalid input(s): FREET3 Anemia work up: No results for input(s): VITAMINB12, FOLATE, FERRITIN, TIBC, IRON, RETICCTPCT in the last 72 hours. Sepsis Labs: Recent Labs  Lab 12/19/17 0553 12/22/17 2100 12/23/17 0055 12/23/17 0730  WBC 7.3 6.3 6.8 6.4    Microbiology No results found for this or any previous visit (from the past 240 hour(s)).  Procedures and diagnostic studies:  Ct Abdomen Pelvis W Contrast  Result Date: 12/23/2017 CLINICAL DATA:  Generalized abdominal pain EXAM: CT ABDOMEN AND PELVIS WITH CONTRAST TECHNIQUE: Multidetector CT imaging of the abdomen and pelvis was performed using the standard protocol following bolus administration of intravenous contrast. CONTRAST:  170mL OMNIPAQUE IOHEXOL 300 MG/ML  SOLN COMPARISON:  CT abdomen pelvis 08/15/2017 FINDINGS: LOWER CHEST: There is no basilar pleural or apical pericardial effusion. HEPATOBILIARY: The hepatic contours and density are normal. There is no intra- or extrahepatic biliary dilatation. The gallbladder is normal. PANCREAS: The pancreatic parenchymal contours are normal and there is no ductal dilatation. There is no peripancreatic fluid collection. SPLEEN: Normal. ADRENALS/URINARY TRACT: --Adrenal glands: Normal. --Right kidney/ureter: No hydronephrosis, nephroureterolithiasis, perinephric stranding or solid renal mass. --Left kidney/ureter: No hydronephrosis, nephroureterolithiasis, perinephric stranding or solid renal mass. --Urinary bladder: Normal for degree of distention STOMACH/BOWEL: --Stomach/Duodenum: There is no hiatal hernia or other gastric abnormality. The duodenal course and caliber are normal. --Small bowel: No  dilatation or inflammation. --Colon: No focal abnormality. --Appendix: Normal. VASCULAR/LYMPHATIC: Normal course and caliber of the major abdominal vessels. No abdominal or pelvic lymphadenopathy. REPRODUCTIVE: Status post hysterectomy. No adnexal mass. MUSCULOSKELETAL. No bony spinal canal stenosis or focal osseous abnormality. OTHER: Peritoneal dialysis catheter coiled in the low pelvis. Unchanged fat containing umbilical and supraumbilical hernias. IMPRESSION: No acute abdominal or pelvic abnormality. Electronically Signed   By: Ulyses Jarred M.D.   On: 12/23/2017 04:34    Medications:   . allopurinol  100 mg Oral QHS  . atorvastatin  40 mg Oral QHS  . calcitRIOL  0.5 mcg Oral QHS  . cinacalcet  30 mg Oral Q supper  . gentamicin cream  1 application Topical Daily  . metoprolol tartrate  25 mg Oral QHS  . multivitamin  1 tablet Oral QHS  . pantoprazole  40 mg Oral BID  .  sucroferric oxyhydroxide  1,000 mg Oral TID WC  . venlafaxine XR  75 mg Oral QHS   Continuous Infusions: . dialysis solution 2.5% low-MG/low-CA Stopped (12/24/17 1315)     LOS: 0 days   Geradine Girt  [facility]   *Please refer to [email or web address], password [email or web address] to get updated schedule on who will round on this patient, as hospitalists switch teams weekly. If 7PM-7AM, please contact night-coverage at [email or web address], password [email or web address] for any overnight needs.  12/24/2017, 3:45 PM

## 2017-12-24 NOTE — Progress Notes (Addendum)
[facility] Progress Note   Subjective:  Seen in room. S/p capsule endoscopy this morning. Report pending, but she tells me that it showed some ulcerations and irritation. No CP/dyspnea this morning. PD going well.  Objective Vitals:   12/24/17 0736 12/24/17 0843 12/24/17 1032 12/24/17 1040  BP: 130/76 (!) 158/90 (!) 160/96 (!) 188/97  Pulse: 71 61 84 83  Resp: $Remo'16 13 15 15  'NkJps$ Temp: 98 F (36.7 C) 98 F (36.7 C) 97.7 F (36.5 C)   TempSrc: Oral Oral Oral   SpO2: 96% 100% 96% 95%  Weight:  121.3 kg    Height:  $Remove'5\' 7"'nxNmlaH$  (1.702 m)     Physical Exam General: Well appearing, NAD Heart: RRR; no murmur Lungs: CTAB Abdomen: soft, non-tender. PD cath without tenderness Extremities: No LE edema Dialysis Access: PD cath in L abdomen.  Additional Objective Labs: Basic Metabolic Panel: Recent Labs  Lab 12/18/17 1836 12/19/17 0553 12/22/17 2024 12/23/17 0730  NA 139 137 142 138  K 3.3* 3.4* 4.2 4.2  CL 94* 94* 101 99  CO2 23 22 19* 19*  GLUCOSE 113* 95 88 89  BUN 64* 62* 66* 70*  CREATININE 16.30* 15.55* 17.18* 17.52*  CALCIUM 7.9* 7.9* 8.0* 7.6*  PHOS 3.5 4.8*  --   --    Liver Function Tests: Recent Labs  Lab 12/18/17 1836 12/19/17 0553 12/22/17 2024  AST  --   --  29  ALT  --   --  20  ALKPHOS  --   --  65  BILITOT  --   --  0.7  PROT  --   --  7.0  ALBUMIN 3.3* 3.2* 3.5   CBC: Recent Labs  Lab 12/18/17 1836 12/19/17 0553 12/22/17 2100 12/23/17 0055 12/23/17 0730  WBC 7.0 7.3 6.3 6.8 6.4  NEUTROABS  --   --  3.6  --   --   HGB 8.1* 8.4* 8.1* 7.6* 9.2*  HCT 26.0* 26.1* 25.2* 24.2* 29.0*  MCV 92.5 90.3 93.0 92.4 90.6  PLT 306 264 267 230 215   Studies/Results: Ct Abdomen Pelvis W Contrast  Result Date: 12/23/2017 CLINICAL DATA:  Generalized abdominal pain EXAM: CT ABDOMEN AND PELVIS WITH CONTRAST TECHNIQUE: Multidetector CT imaging of the abdomen and pelvis was performed using the standard protocol following bolus administration of  intravenous contrast. CONTRAST:  164mL OMNIPAQUE IOHEXOL 300 MG/ML  SOLN COMPARISON:  CT abdomen pelvis 08/15/2017 FINDINGS: LOWER CHEST: There is no basilar pleural or apical pericardial effusion. HEPATOBILIARY: The hepatic contours and density are normal. There is no intra- or extrahepatic biliary dilatation. The gallbladder is normal. PANCREAS: The pancreatic parenchymal contours are normal and there is no ductal dilatation. There is no peripancreatic fluid collection. SPLEEN: Normal. ADRENALS/URINARY TRACT: --Adrenal glands: Normal. --Right kidney/ureter: No hydronephrosis, nephroureterolithiasis, perinephric stranding or solid renal mass. --Left kidney/ureter: No hydronephrosis, nephroureterolithiasis, perinephric stranding or solid renal mass. --Urinary bladder: Normal for degree of distention STOMACH/BOWEL: --Stomach/Duodenum: There is no hiatal hernia or other gastric abnormality. The duodenal course and caliber are normal. --Small bowel: No dilatation or inflammation. --Colon: No focal abnormality. --Appendix: Normal. VASCULAR/LYMPHATIC: Normal course and caliber of the major abdominal vessels. No abdominal or pelvic lymphadenopathy. REPRODUCTIVE: Status post hysterectomy. No adnexal mass. MUSCULOSKELETAL. No bony spinal canal stenosis or focal osseous abnormality. OTHER: Peritoneal dialysis catheter coiled in the low pelvis. Unchanged fat containing umbilical and supraumbilical hernias. IMPRESSION: No acute abdominal or pelvic abnormality. Electronically Signed   By: Cletus Gash.D.  On: 12/23/2017 04:34   Medications: . dialysis solution 2.5% low-MG/low-CA Stopped (12/24/17 1315)   . allopurinol  100 mg Oral QHS  . atorvastatin  40 mg Oral QHS  . calcitRIOL  0.5 mcg Oral QHS  . cinacalcet  30 mg Oral Q supper  . gentamicin cream  1 application Topical Daily  . metoprolol tartrate  25 mg Oral QHS  . multivitamin  1 tablet Oral QHS  . pantoprazole  40 mg Oral BID  . sucroferric oxyhydroxide   1,000 mg Oral TID WC  . venlafaxine XR  75 mg Oral QHS    Dialysis Orders: CCPD, 7X Week, EDW 108.5 (kg) Ca 2.5 (mEq/L) Mg 0.5 (mEq/L) Dextrose 1.5; 2.5; 4.25 %, # Exchanges 5, fill vol 3000 mL, Dwell Time 2 hrs 0 min, last fill vol 3000 mL, Day Exchanges 1, daytime fill vol 3000 mL, Day Dwell Time 4 hrs 0 min   Assessment/Plan: 1. Symptomatic anemia/melena.  Recurrent admissions with similar presentation. Previous GI workup has been unremarkable. S/p 2U PRBCs 12/19. GI following, s/p capsule endoscopy this morning. 2. ESRD -  Continue CCPD per rx.  3. Hypertension/volume  - BP ok. Volume stable- no gross overload on exam. Using 2.5% sol on PD.   4. Anemia  - As above. Mircera 225 dosed 12/11. On Velphoro binder which can cause dark stools. Last tsat WNL. 5.  Metabolic bone disease -  Continue VDRA/binders. On Sensipar. Ca slightly low. Follow for today. 6.  Nutrition - Renal diet/vitamins when eating.   Veneta Penton, PA-C 12/24/2017, 1:23 PM  [facility] Pager: [phone]  Pt seen, examined and agree w A/P as above.  Kelly Splinter MD [employer] pager [phone]   12/24/2017, 2:52 PM

## 2017-12-24 NOTE — Transfer of Care (Signed)
Immediate Anesthesia Transfer of Care Note  Patient: Patricia [name]  Procedure(s) Performed: ENTEROSCOPY (N/A ) GIVENS CAPSULE STUDY (N/A ) BIOPSY SUBMUCOSAL TATTOO INJECTION  Patient Location: Endoscopy Unit  Anesthesia Type:MAC  Level of Consciousness: awake and patient cooperative  Airway & Oxygen Therapy: Patient Spontanous Breathing  Post-op Assessment: Report given to RN and Post -op Vital signs reviewed and stable  Post vital signs: Reviewed and stable  Last Vitals:  Vitals Value Taken Time  BP    Temp    Pulse 84 12/24/2017 10:33 AM  Resp 15 12/24/2017 10:33 AM  SpO2 96 % 12/24/2017 10:33 AM  Vitals shown include unvalidated device data.  Last Pain:  Vitals:   12/24/17 0843  TempSrc: Oral  PainSc: 5          Complications: No apparent anesthesia complications

## 2017-12-24 NOTE — Anesthesia Preprocedure Evaluation (Signed)
Anesthesia Evaluation  Patient identified by MRN, date of birth, ID band Patient awake    Reviewed: Allergy & Precautions, NPO status , Patient's Chart, lab work & pertinent test results, reviewed documented beta blocker date and time   History of Anesthesia Complications Negative for: history of anesthetic complications  Airway Mallampati: IV  TM Distance: >3 FB Neck ROM: Full    Dental  (+) Dental Advisory Given   Pulmonary shortness of breath, neg COPD,    breath sounds clear to auscultation       Cardiovascular hypertension, Pt. on medications and Pt. on home beta blockers + dysrhythmias  Rhythm:Regular     Neuro/Psych  Headaches, Anxiety CVA    GI/Hepatic GERD  Medicated,  Endo/Other  Morbid obesity  Renal/GU ESRF and DialysisRenal disease     Musculoskeletal  (+) Arthritis ,   Abdominal   Peds  Hematology  (+) anemia ,   Anesthesia Other Findings   Reproductive/Obstetrics                             Anesthesia Physical Anesthesia Plan  ASA: III  Anesthesia Plan: MAC   Post-op Pain Management:    Induction: Intravenous  PONV Risk Score and Plan: 2 and Treatment may vary due to age or medical condition and Propofol infusion  Airway Management Planned: Nasal Cannula  Additional Equipment: None  Intra-op Plan:   Post-operative Plan:   Informed Consent: I have reviewed the patients History and Physical, chart, labs and discussed the procedure including the risks, benefits and alternatives for the proposed anesthesia with the patient or authorized representative who has indicated his/her understanding and acceptance.   Dental advisory given  Plan Discussed with: CRNA and Surgeon  Anesthesia Plan Comments:         Anesthesia Quick Evaluation

## 2017-12-25 DIAGNOSIS — K922 Gastrointestinal hemorrhage, unspecified: Secondary | ICD-10-CM | POA: Diagnosis not present

## 2017-12-25 DIAGNOSIS — N186 End stage renal disease: Secondary | ICD-10-CM | POA: Diagnosis not present

## 2017-12-25 DIAGNOSIS — Z992 Dependence on renal dialysis: Secondary | ICD-10-CM | POA: Diagnosis not present

## 2017-12-25 LAB — BASIC METABOLIC PANEL
Anion gap: 20 — ABNORMAL HIGH (ref 5–15)
BUN: 57 mg/dL — ABNORMAL HIGH (ref 6–20)
CALCIUM: 8.2 mg/dL — AB (ref 8.9–10.3)
CO2: 21 mmol/L — ABNORMAL LOW (ref 22–32)
Chloride: 94 mmol/L — ABNORMAL LOW (ref 98–111)
Creatinine, Ser: 16.56 mg/dL — ABNORMAL HIGH (ref 0.44–1.00)
GFR calc Af Amer: 3 mL/min — ABNORMAL LOW (ref >60)
GFR calc non Af Amer: 2 mL/min — ABNORMAL LOW (ref >60)
Glucose, Bld: 112 mg/dL — ABNORMAL HIGH (ref 70–99)
Potassium: 3.4 mmol/L — ABNORMAL LOW (ref 3.5–5.1)
Sodium: 135 mmol/L (ref 135–145)

## 2017-12-25 LAB — CBC
HCT: 30.5 % — ABNORMAL LOW (ref 36.0–46.0)
Hemoglobin: 9.9 g/dL — ABNORMAL LOW (ref 12.0–15.0)
MCH: 28.8 pg (ref 26.0–34.0)
MCHC: 32.5 g/dL (ref 30.0–36.0)
MCV: 88.7 fL (ref 80.0–100.0)
Platelets: 198 10*3/uL (ref 150–400)
RBC: 3.44 MIL/uL — ABNORMAL LOW (ref 3.87–5.11)
RDW: 15.1 % (ref 11.5–15.5)
WBC: 6 10*3/uL (ref 4.0–10.5)
nRBC: 1.5 % — ABNORMAL HIGH (ref 0.0–0.2)

## 2017-12-25 MED ORDER — BUTALBITAL-APAP-CAFFEINE 50-325-40 MG PO TABS
2.0000 | ORAL_TABLET | Freq: Once | ORAL | Status: AC
  Administered 2017-12-25: 2 via ORAL
  Filled 2017-12-25: qty 2

## 2017-12-25 MED ORDER — PHENOL 1.4 % MT LIQD: 1.0000 | OROMUCOSAL | Status: DC | PRN

## 2017-12-25 MED ORDER — PANTOPRAZOLE SODIUM 40 MG PO TBEC: 40.0000 mg | DELAYED_RELEASE_TABLET | Freq: Two times a day (BID) | ORAL | 0 refills | Status: DC

## 2017-12-25 MED ORDER — PHENOL 1.4 % MT LIQD: 1.0000 | OROMUCOSAL | 0 refills | Status: DC | PRN

## 2017-12-25 NOTE — Progress Notes (Addendum)
[facility] Progress Note   Subjective:  Seen in room, tired but feeling better. No melena today. No CP/dyspnea.  Objective Vitals:   12/24/17 1844 12/25/17 0010 12/25/17 0500 12/25/17 0935  BP:  (!) 148/88 (!) 142/78   Pulse:  81 70   Resp:  16 16   Temp:  98.3 F (36.8 C) 97.9 F (36.6 C)   TempSrc:  Oral Oral   SpO2:  96% 95%   Weight: 123.7 kg   127.6 kg  Height:       Physical Exam General: Well appearing, NAD Heart: RRR; no murmur Lungs: CTAB Abdomen: soft, non-tender. PD cath without tenderness Extremities: No LE edema Dialysis Access: PD cath in L abdomen.  Additional Objective Labs: Basic Metabolic Panel: Recent Labs  Lab 12/18/17 1836 12/19/17 0553 12/22/17 2024 12/23/17 0730 12/25/17 0236  NA 139 137 142 138 135  K 3.3* 3.4* 4.2 4.2 3.4*  CL 94* 94* 101 99 94*  CO2 23 22 19* 19* 21*  GLUCOSE 113* 95 88 89 112*  BUN 64* 62* 66* 70* 57*  CREATININE 16.30* 15.55* 17.18* 17.52* 16.56*  CALCIUM 7.9* 7.9* 8.0* 7.6* 8.2*  PHOS 3.5 4.8*  --   --   --    Liver Function Tests: Recent Labs  Lab 12/18/17 1836 12/19/17 0553 12/22/17 2024  AST  --   --  29  ALT  --   --  20  ALKPHOS  --   --  65  BILITOT  --   --  0.7  PROT  --   --  7.0  ALBUMIN 3.3* 3.2* 3.5   CBC: Recent Labs  Lab 12/19/17 0553 12/22/17 2100 12/23/17 0055 12/23/17 0730 12/25/17 0236  WBC 7.3 6.3 6.8 6.4 6.0  NEUTROABS  --  3.6  --   --   --   HGB 8.4* 8.1* 7.6* 9.2* 9.9*  HCT 26.1* 25.2* 24.2* 29.0* 30.5*  MCV 90.3 93.0 92.4 90.6 88.7  PLT 264 267 230 215 198   Medications: . dialysis solution 2.5% low-MG/low-CA     . allopurinol  100 mg Oral QHS  . atorvastatin  40 mg Oral QHS  . calcitRIOL  0.5 mcg Oral QHS  . cinacalcet  30 mg Oral Q supper  . gentamicin cream  1 application Topical Daily  . metoprolol tartrate  25 mg Oral QHS  . multivitamin  1 tablet Oral QHS  . pantoprazole  40 mg Oral BID  . sucroferric oxyhydroxide  1,000 mg Oral TID WC  .  venlafaxine XR  75 mg Oral QHS    Dialysis Orders: CCPD, 7X Week, EDW 108.5 (kg) Ca 2.5 (mEq/L) Mg 0.5 (mEq/L) Dextrose 1.5; 2.5; 4.25 %, # Exchanges 5, fill vol 3000 mL, Dwell Time 2 hrs 0 min, last fill vol 3000 mL, Day Exchanges 1, daytime fill vol 3000 mL, Day Dwell Time 4 hrs 0 min  Assessment/Plan: 1. Symptomatic anemia/melena. Recurrent admissions with similar presentation. Previous GI workup has been unremarkable. S/p 2U PRBCs 12/19. GI following, s/p capsule endoscopy 12/20 - non-bleeding gastric ulcer noted, final read on capsule study to come next week. GI has signed off.  2. ESRD -Continue CCPDnightly. 3. Hypertension/volume - BP ok. Volume stable- no gross overload on exam. Using 2.5% sol on PD. 4. Anemia: Hgb up to 9.9, Mircera 225 dosed 12/11. On Velphoro binder which can cause dark stools. Last tsat WNL. 5. Metabolic bone disease - Continue VDRA/binders. On Sensipar.  6. Nutrition - Renal  diet/vitamins when eating.  Veneta Penton, PA-C 12/25/2017, 10:11 AM  [facility] Pager: [phone]  Pt seen, examined and agree w A/P as above.  Kelly Splinter MD [employer] pager [phone]   12/25/2017, 1:20 PM

## 2017-12-25 NOTE — Progress Notes (Addendum)
[facility] Progress Note  CC:  I want to go home  Assessment / Plan: Unexplained anemia with dark stools    -Previously evaluated by [facility] gastroenterology 2019    -EGD 05/2017 with Dr. Paulita Fujita showed gastritis and duodenitis    -EGD 08/2017 with Dr. Alessandra Bevels revealed a nonobstructing Schatzki's ring, small hiatal hernia, mild prepyloric gastritis, and mild duodenitis.  Biopsies were negative for H. Pylori    -Colonoscopy 08/2017 with Dr. Therisa Doyne showed mild erythema in the sigmoid, transverse, and descending colon    -Capsule endoscopy 10/19 showed an area of nodularity in the distal small bowel that was unlikely to be reached from retrograde device assisted colonoscopy    - Enteroscopy 12/24/17 with Dr. Rush Landmark: non-bleeding clean-based gastric ulcer and erosive gastropathy    -Need for recurrent blood transfusions    -Anemia thought to be multifactorial in the setting of end-stage renal disease    -Outpatient hematology consult previously recommended    -Treated with Epogen, not taking oral iron End-stage renal disease on hemodialysis, on Epogen Diverticulosis seen on prior CT scan GERD  Hemoglobin overall stable with a hemoglobin of 9.9.  No overt bleeding.  Capsule endoscopy completed today.  Continue PPI twice daily.  OK for discharge from GI perspective.  Will convey results of the capsule endoscopy study directly to the patient when they are available next week.  Follow-up with Dr. Rush Landmark as an outpatient.  Please call the on-call gastroenterologist with any additional questions or concerns in the meantime  Subjective: Enteroscopy yesterday revealed a non-bleeding clean-based gastric ulcer and erosive gastropathy, due adenopathy of the duodenal bulb.  The mucosa was otherwise normal.  Capsule endoscopy placed. Study was complete at the time of my rounds.  Ms. Brim feels well being allbeit tired.  There is been no overt bleeding.  GI review of systems  is negative.  Objective:  Vital signs in last 24 hours: Temp:  [97.9 F (36.6 C)-98.3 F (36.8 C)] 98.1 F (36.7 C) (12/21 0957) Pulse Rate:  [62-81] 70 (12/21 0500) Resp:  [16-18] 16 (12/21 0500) BP: (137-160)/(75-89) 137/75 (12/21 0957) SpO2:  [95 %-96 %] 95 % (12/21 0500) Weight:  [123.7 kg-127.6 kg] 127.6 kg (12/21 0935) Last BM Date: 12/23/17 General:   Alert, in NAD Heart:  Regular rate and rhythm; no murmurs Pulm: Clear anteriorly; no wheezing Abdomen:  Soft. Nontender. Nondistended. Normal bowel sounds. No rebound or guarding.  PD catheter present in the lower abdomen.  No associated tenderness. LAD: No inguinal or umbilical LAD Extremities:  Without edema. Neurologic:  Alert and  oriented x4;  grossly normal neurologically. Psych:  Alert and cooperative. Normal mood and affect.  Lab Results: Recent Labs    12/23/17 0055 12/23/17 0730 12/25/17 0236  WBC 6.8 6.4 6.0  HGB 7.6* 9.2* 9.9*  HCT 24.2* 29.0* 30.5*  PLT 230 215 198   BMET Recent Labs    12/22/17 2024 12/23/17 0730 12/25/17 0236  NA 142 138 135  K 4.2 4.2 3.4*  CL 101 99 94*  CO2 19* 19* 21*  GLUCOSE 88 89 112*  BUN 66* 70* 57*  CREATININE 17.18* 17.52* 16.56*  CALCIUM 8.0* 7.6* 8.2*   LFT Recent Labs    12/22/17 2024  PROT 7.0  ALBUMIN 3.5  AST 29  ALT 20  ALKPHOS 65  BILITOT 0.7   PT/INR Recent Labs    12/22/17 2024  LABPROT 12.9  INR 0.98      LOS: 0 days  [place]  12/25/2017, 11:06 AM

## 2017-12-25 NOTE — Discharge Summary (Signed)
Physician Discharge Summary  Patricia [name] WUG:[id number] DOB: 10-11-1975 DOA: 12/22/2017  PCP: Blair Heys, PA-C  Admit date: 12/22/2017 Discharge date: 12/25/2017  Admitted From: home Discharge disposition: home   Recommendations for Outpatient Follow-Up:   1. Outpatient follow up for capsule results 2. PPI changed to PPI   Discharge Diagnosis:   Principal Problem:   Acute GI bleeding Active Problems:   Morbid obesity (HCC)   HTN (hypertension)   PAT (paroxysmal atrial tachycardia) (HCC)   ESRD on peritoneal dialysis ([place])   Symptomatic anemia    Discharge Condition: Improved.  Diet recommendation: renal  Wound care: None.  Code status: Full.   History of Present Illness:   Patricia [name] is a 42 y.o. female with history of ESRD on peritoneal dialysis was recently admitted for symptomatic anemia and discharged 4 days ago presents to the ER because of patient noticing increasing dark stools black in color concerning for GI bleed.  In addition patient states she also has been having lower abdominal pain which has been getting worse since discharge.  Denies any nausea vomiting has been having some occasional diarrhea.  Denies any fever chills or any discolored dialysate fluid from the peritoneal dialysis catheter.  Feeling weak and short of breath on walking.   Hospital Course by Problem:   ABLA -suspected GI source - had EGD and colonoscopy in August 2019 about 5 months ago which showed duodenitis and gastritis and also erythematous mucosa of the ascending transverse and sigmoid colon with some diverticulosis.  -capsule endoscopy in October which did not show any definite source of bleeding.  -s/p 2 units PRBC -now s/p small bowel enteroscopy with capsule placed -outpatient follow up for results: for now increased PPI in AM  Abdominal pain -no sign of infection -CT scan unrevealing -resolved  Hypertension  -metoprolol.  ESRD  -on  peritoneal dialysis  Hyperlipidemia  - statin  obesity Body mass index is 41.88 kg/m.    Medical Consultants:   GI renal  Discharge Exam:   Vitals:   12/25/17 0500 12/25/17 0957  BP: (!) 142/78 137/75  Pulse: 70   Resp: 16   Temp: 97.9 F (36.6 C) 98.1 F (36.7 C)  SpO2: 95%    Vitals:   12/25/17 0010 12/25/17 0500 12/25/17 0935 12/25/17 0957  BP: (!) 148/88 (!) 142/78  137/75  Pulse: 81 70    Resp: 16 16    Temp: 98.3 F (36.8 C) 97.9 F (36.6 C)  98.1 F (36.7 C)  TempSrc: Oral Oral    SpO2: 96% 95%    Weight:   127.6 kg   Height:        General exam: Appears calm and comfortable.     The results of significant diagnostics from this hospitalization (including imaging, microbiology, ancillary and laboratory) are listed below for reference.     Procedures and Diagnostic Studies:   Ct Abdomen Pelvis W Contrast  Result Date: 12/23/2017 CLINICAL DATA:  Generalized abdominal pain EXAM: CT ABDOMEN AND PELVIS WITH CONTRAST TECHNIQUE: Multidetector CT imaging of the abdomen and pelvis was performed using the standard protocol following bolus administration of intravenous contrast. CONTRAST:  132mL OMNIPAQUE IOHEXOL 300 MG/ML  SOLN COMPARISON:  CT abdomen pelvis 08/15/2017 FINDINGS: LOWER CHEST: There is no basilar pleural or apical pericardial effusion. HEPATOBILIARY: The hepatic contours and density are normal. There is no intra- or extrahepatic biliary dilatation. The gallbladder is normal. PANCREAS: The pancreatic parenchymal contours are normal and there is no  ductal dilatation. There is no peripancreatic fluid collection. SPLEEN: Normal. ADRENALS/URINARY TRACT: --Adrenal glands: Normal. --Right kidney/ureter: No hydronephrosis, nephroureterolithiasis, perinephric stranding or solid renal mass. --Left kidney/ureter: No hydronephrosis, nephroureterolithiasis, perinephric stranding or solid renal mass. --Urinary bladder: Normal for degree of distention  STOMACH/BOWEL: --Stomach/Duodenum: There is no hiatal hernia or other gastric abnormality. The duodenal course and caliber are normal. --Small bowel: No dilatation or inflammation. --Colon: No focal abnormality. --Appendix: Normal. VASCULAR/LYMPHATIC: Normal course and caliber of the major abdominal vessels. No abdominal or pelvic lymphadenopathy. REPRODUCTIVE: Status post hysterectomy. No adnexal mass. MUSCULOSKELETAL. No bony spinal canal stenosis or focal osseous abnormality. OTHER: Peritoneal dialysis catheter coiled in the low pelvis. Unchanged fat containing umbilical and supraumbilical hernias. IMPRESSION: No acute abdominal or pelvic abnormality. Electronically Signed   By: Ulyses Jarred M.D.   On: 12/23/2017 04:34     Labs:   Basic Metabolic Panel: Recent Labs  Lab 12/18/17 1836 12/19/17 0553 12/22/17 2024 12/23/17 0730 12/25/17 0236  NA 139 137 142 138 135  K 3.3* 3.4* 4.2 4.2 3.4*  CL 94* 94* 101 99 94*  CO2 23 22 19* 19* 21*  GLUCOSE 113* 95 88 89 112*  BUN 64* 62* 66* 70* 57*  CREATININE 16.30* 15.55* 17.18* 17.52* 16.56*  CALCIUM 7.9* 7.9* 8.0* 7.6* 8.2*  PHOS 3.5 4.8*  --   --   --    GFR Estimated Creatinine Clearance: 6 mL/min (A) (by C-G formula based on SCr of 16.56 mg/dL (H)). Liver Function Tests: Recent Labs  Lab 12/18/17 1836 12/19/17 0553 12/22/17 2024  AST  --   --  29  ALT  --   --  20  ALKPHOS  --   --  65  BILITOT  --   --  0.7  PROT  --   --  7.0  ALBUMIN 3.3* 3.2* 3.5   No results for input(s): LIPASE, AMYLASE in the last 168 hours. No results for input(s): AMMONIA in the last 168 hours. Coagulation profile Recent Labs  Lab 12/22/17 2024  INR 0.98    CBC: Recent Labs  Lab 12/19/17 0553 12/22/17 2100 12/23/17 0055 12/23/17 0730 12/25/17 0236  WBC 7.3 6.3 6.8 6.4 6.0  NEUTROABS  --  3.6  --   --   --   HGB 8.4* 8.1* 7.6* 9.2* 9.9*  HCT 26.1* 25.2* 24.2* 29.0* 30.5*  MCV 90.3 93.0 92.4 90.6 88.7  PLT 264 267 230 215 198    Cardiac Enzymes: No results for input(s): CKTOTAL, CKMB, CKMBINDEX, TROPONINI in the last 168 hours. BNP: Invalid input(s): POCBNP CBG: No results for input(s): GLUCAP in the last 168 hours. D-Dimer No results for input(s): DDIMER in the last 72 hours. Hgb A1c No results for input(s): HGBA1C in the last 72 hours. Lipid Profile No results for input(s): CHOL, HDL, LDLCALC, TRIG, CHOLHDL, LDLDIRECT in the last 72 hours. Thyroid function studies No results for input(s): TSH, T4TOTAL, T3FREE, THYROIDAB in the last 72 hours.  Invalid input(s): FREET3 Anemia work up No results for input(s): VITAMINB12, FOLATE, FERRITIN, TIBC, IRON, RETICCTPCT in the last 72 hours. Microbiology No results found for this or any previous visit (from the past 240 hour(s)).   Discharge Instructions:   Discharge Instructions    Discharge instructions   Complete by:  As directed    Continue PD GI follow up CBC in 1 week with PCP protonix has been changed to BID   Increase activity slowly   Complete by:  As directed  Allergies as of 12/25/2017      Reactions   Nsaids Other (See Comments)   Cannot take due to Kidney disease/Kidney function   Tolmetin Other (See Comments)   Cannot take due to Kidney Disease      Medication List    STOP taking these medications   aspirin 81 MG chewable tablet     TAKE these medications   allopurinol 100 MG tablet Commonly known as:  ZYLOPRIM Take 100 mg by mouth at bedtime.   ALPRAZolam 1 MG tablet Commonly known as:  XANAX Take 1 mg by mouth 2 (two) times daily as needed for anxiety.   atorvastatin 40 MG tablet Commonly known as:  LIPITOR Take 40 mg by mouth at bedtime.   calcitRIOL 0.5 MCG capsule Commonly known as:  ROCALTROL Take 1 capsule (0.5 mcg total) by mouth daily with breakfast. What changed:  when to take this   cinacalcet 30 MG tablet Commonly known as:  SENSIPAR Take 1 tablet (30 mg total) by mouth daily. What changed:  when  to take this   diphenhydramine-acetaminophen 25-500 MG Tabs tablet Commonly known as:  TYLENOL PM Take 2 tablets by mouth at bedtime as needed (sleep).   metoprolol tartrate 25 MG tablet Commonly known as:  LOPRESSOR Take 25 mg by mouth at bedtime.   multivitamin Tabs tablet Take 1 tablet by mouth at bedtime.   ondansetron 8 MG tablet Commonly known as:  ZOFRAN Take 8 mg by mouth every 8 (eight) hours as needed for nausea or vomiting.   pantoprazole 40 MG tablet Commonly known as:  PROTONIX Take 1 tablet (40 mg total) by mouth 2 (two) times daily. What changed:  when to take this   phenol 1.4 % Liqd Commonly known as:  CHLORASEPTIC Use as directed 1 spray in the mouth or throat as needed for throat irritation / pain.   promethazine 25 MG tablet Commonly known as:  PHENERGAN Take 25 mg by mouth every 6 (six) hours as needed for nausea or vomiting.   senna 8.6 MG Tabs tablet Commonly known as:  SENOKOT Take 2 tablets (17.2 mg total) by mouth at bedtime.   VELPHORO 500 MG chewable tablet Generic drug:  sucroferric oxyhydroxide Chew 1,000 mg by mouth 3 (three) times daily with meals.   venlafaxine XR 75 MG 24 hr capsule Commonly known as:  EFFEXOR-XR Take 75 mg by mouth at bedtime.      Follow-up Information    Long, Caryl Pina, PA-C Follow up in 1 week(s).   Specialty:  Physician Assistant Contact information: [id number] [place] [address] [phone]        keep Follow up.            Time coordinating discharge: 25 min  Signed:  Geradine Girt DO  [facility] 12/25/2017, 4:03 PM

## 2017-12-26 ENCOUNTER — Encounter (HOSPITAL_COMMUNITY): Payer: Self-pay | Admitting: Gastroenterology

## 2017-12-31 ENCOUNTER — Encounter: Payer: Self-pay | Admitting: Gastroenterology

## 2018-01-03 ENCOUNTER — Telehealth: Payer: Self-pay

## 2018-01-03 NOTE — Telephone Encounter (Signed)
Appt rescheduled to Dr Rush Landmark on 1/24 pt aware

## 2018-01-03 NOTE — Telephone Encounter (Signed)
-----   Message from Irving Copas., MD sent at 01/02/2018  7:34 AM EST ----- Regarding: RE: I thought it was weird as well. Xion Debruyne please get this patient rescheduled with me in later January or with one of the PAs.   Thanks. ----- Message ----- From: Doran Stabler, MD Sent: 01/02/2018   7:29 AM EST To: Timothy Lasso, RN, Irving Copas., MD  Chester Holstein,    I got your email regarding this patient.  Not sure how she got on my schedule for clinic, since I have not seen her before.  Since she is new to [place] and recently had consult/procedures with you in clinic, she is now attached to you and should follow up with you in clinic.  Mallie Mussel

## 2018-01-06 ENCOUNTER — Encounter (HOSPITAL_COMMUNITY): Payer: Self-pay | Admitting: Gastroenterology

## 2018-01-06 NOTE — Anesthesia Postprocedure Evaluation (Signed)
Anesthesia Post Note  Patient: Yazleemar Strassner  Procedure(s) Performed: ENTEROSCOPY (N/A ) GIVENS CAPSULE STUDY (N/A ) BIOPSY SUBMUCOSAL TATTOO INJECTION     Patient location during evaluation: Endoscopy Anesthesia Type: MAC Level of consciousness: awake and alert Pain management: pain level controlled Vital Signs Assessment: post-procedure vital signs reviewed and stable Respiratory status: spontaneous breathing, nonlabored ventilation, respiratory function stable and patient connected to nasal cannula oxygen Cardiovascular status: stable and blood pressure returned to baseline Postop Assessment: no apparent nausea or vomiting Anesthetic complications: no    Last Vitals:  Vitals:   12/25/17 0500 12/25/17 0957  BP: (!) 142/78 137/75  Pulse: 70   Resp: 16   Temp: 36.6 C 36.7 C  SpO2: 95%     Last Pain:  Vitals:   12/25/17 0806  TempSrc:   PainSc: 0-No pain                 Melford Tullier

## 2018-01-11 ENCOUNTER — Ambulatory Visit: Payer: Medicare Other | Admitting: Gastroenterology

## 2018-01-28 ENCOUNTER — Ambulatory Visit: Payer: Medicare Other | Admitting: Gastroenterology

## 2018-02-10 ENCOUNTER — Ambulatory Visit: Payer: Medicare Other

## 2018-02-14 ENCOUNTER — Telehealth: Payer: Self-pay

## 2018-02-14 DIAGNOSIS — T189XXS Foreign body of alimentary tract, part unspecified, sequela: Secondary | ICD-10-CM

## 2018-02-14 NOTE — Telephone Encounter (Signed)
Pt returned your call.  

## 2018-02-14 NOTE — Telephone Encounter (Signed)
-----   Message from Irving Copas., MD sent at 02/12/2018  7:47 AM EST ----- Regarding: Patient update Patricia [name],This patient was seen during a hospital course in December.  She was supposed to follow-up in January but does not look like she did.As I reviewed her video capsule endoscopy which was performed looks like the capsule never made its way to the colon.Although extremely unlikely from our standpoint to still be in her abdomen she should have a KUB 2 view to evaluate for possible retained PillCam.If the patient is going to follow-up with our group then this needs to be completed before she sees me back in clinic.If you could relay this information to the patient's PCP in case she is seeking care or just wanting to follow-up with her primary care provider in the interim.Thank you.GM

## 2018-02-14 NOTE — Telephone Encounter (Signed)
Left message on machine to call back  

## 2018-02-14 NOTE — Telephone Encounter (Signed)
The pt was advised and she has a followup appt with Dr Rush Landmark on 2/13.  She will come in for xray prior to that appt.  Order in [place]

## 2018-02-17 ENCOUNTER — Encounter: Payer: Self-pay | Admitting: Gastroenterology

## 2018-02-17 ENCOUNTER — Ambulatory Visit (INDEPENDENT_AMBULATORY_CARE_PROVIDER_SITE_OTHER)
Admission: RE | Admit: 2018-02-17 | Discharge: 2018-02-17 | Disposition: A | Discharge status: Home / Self Care | Payer: Medicare Other | Source: Ambulatory Visit | Attending: Gastroenterology | Admitting: Gastroenterology

## 2018-02-17 ENCOUNTER — Ambulatory Visit (INDEPENDENT_AMBULATORY_CARE_PROVIDER_SITE_OTHER): Payer: Medicare Other | Admitting: Gastroenterology

## 2018-02-17 ENCOUNTER — Other Ambulatory Visit (INDEPENDENT_AMBULATORY_CARE_PROVIDER_SITE_OTHER): Payer: Medicare Other

## 2018-02-17 VITALS — BP 132/90 | HR 82 | Ht 67.0 in | Wt 265.0 lb

## 2018-02-17 DIAGNOSIS — D649 Anemia, unspecified: Secondary | ICD-10-CM | POA: Diagnosis not present

## 2018-02-17 DIAGNOSIS — R110 Nausea: Secondary | ICD-10-CM

## 2018-02-17 DIAGNOSIS — R195 Other fecal abnormalities: Secondary | ICD-10-CM | POA: Diagnosis not present

## 2018-02-17 DIAGNOSIS — Q2730 Arteriovenous malformation, site unspecified: Secondary | ICD-10-CM | POA: Diagnosis not present

## 2018-02-17 DIAGNOSIS — K5520 Angiodysplasia of colon without hemorrhage: Secondary | ICD-10-CM

## 2018-02-17 DIAGNOSIS — R6881 Early satiety: Secondary | ICD-10-CM

## 2018-02-17 DIAGNOSIS — T189XXS Foreign body of alimentary tract, part unspecified, sequela: Secondary | ICD-10-CM | POA: Diagnosis not present

## 2018-02-17 DIAGNOSIS — K253 Acute gastric ulcer without hemorrhage or perforation: Secondary | ICD-10-CM | POA: Diagnosis not present

## 2018-02-17 DIAGNOSIS — K259 Gastric ulcer, unspecified as acute or chronic, without hemorrhage or perforation: Secondary | ICD-10-CM

## 2018-02-17 LAB — CBC
HCT: 26.7 % — ABNORMAL LOW (ref 36.0–46.0)
Hemoglobin: 8.8 g/dL — ABNORMAL LOW (ref 12.0–15.0)
MCHC: 33.1 g/dL (ref 30.0–36.0)
MCV: 90 fl (ref 78.0–100.0)
PLATELETS: 282 10*3/uL (ref 150.0–400.0)
RBC: 2.97 Mil/uL — ABNORMAL LOW (ref 3.87–5.11)
RDW: 17.5 % — ABNORMAL HIGH (ref 11.5–15.5)
WBC: 7.9 10*3/uL (ref 4.0–10.5)

## 2018-02-17 LAB — IBC PANEL
Iron: 149 ug/dL — ABNORMAL HIGH (ref 42–145)
Saturation Ratios: 54 % — ABNORMAL HIGH (ref 20.0–50.0)
TRANSFERRIN: 197 mg/dL — AB (ref 212.0–360.0)

## 2018-02-17 LAB — FOLATE: Folate: >24 ng/mL (ref >5.9)

## 2018-02-17 LAB — VITAMIN B12

## 2018-02-17 LAB — FERRITIN: Ferritin: 1055.8 ng/mL — ABNORMAL HIGH (ref 10.0–291.0)

## 2018-02-17 MED ORDER — PROCHLORPERAZINE MALEATE 10 MG PO TABS: 10.0000 mg | ORAL_TABLET | Freq: Three times a day (TID) | ORAL | 2 refills | Status: DC | PRN

## 2018-02-17 MED ORDER — PROCHLORPERAZINE 25 MG RE SUPP: 25.0000 mg | Freq: Two times a day (BID) | RECTAL | 1 refills | Status: DC | PRN

## 2018-02-17 MED ORDER — ESOMEPRAZOLE MAGNESIUM 40 MG PO CPDR: 40.0000 mg | DELAYED_RELEASE_CAPSULE | Freq: Two times a day (BID) | ORAL | 2 refills | Status: DC

## 2018-02-17 NOTE — Progress Notes (Signed)
[place] VISIT   Primary Care Provider Blair Heys, PA-C [id number] [place] [address] [phone]  Patient Profile: Patricia [name] is a 43 y.o. 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  The patient presents to the [facility] for an evaluation and management of problem(s) noted below:  Problem List 1. Anemia, unspecified type   2. Dark stools   3. AVM (arteriovenous malformation) of small bowel, acquired   4. Acute gastric ulcer without hemorrhage or perforation   5. Nausea without vomiting   6. Early satiety     History of Present Illness: This is a patient who was previously extensively evaluated by [facility] during an inpatient admission in 2019 however was felt to no longer be a patient for [facility] and thus transferred to [facility] during an inpatient rehospitalization.  In brief, she was evaluated in May and August and October 2019 for recurrent anemia.  The patient has undergone multiple procedures as noted below in regards to trying to evaluate for a source of GI blood loss.  She returned to the hospital in December with recurrent anemia and dark stools.  Because of the prior work-ups that she had the decision was made to proceed with an enteroscopy and possible capsule placement.  An enteroscopy was performed showing a gastric ulcer and some erythematous mucosa in the stomach but no source of GI blood loss otherwise was noted on a proximal evaluation of the small intestine.  A video capsule endoscopy was performed the next day.  Unfortunately due to computer issues the capsule was not able to be read for more than 2 weeks.  The patient to be discharged from the hospital with plan for follow-up.  The capsule study is outlined below but suggested the finding of 2 angiectasia's that were  approximately 2-1/2 hours into the small bowel.  The capsule endoscopy did not complete its way to the colon and thus complete evaluation was not found.  The patient was called in attempt of trying to be seen in clinic for follow-up however missed the first appointment.  She subsequently comes in today for scheduled follow-up.  The patient states that for the last week or so he has started to notice a change in the color of her stools once again.  Leg have become darker in quality and near black in quality.  She notes no liquid melena however.  She is been dealing with nausea and abdominal pains for years and has had multiple CT scans over the course of the last few years.  She has used Zofran and Phenergan with minimal effect on her symptoms she actually thinks that Phenergan gets her to sleep more than an antiemetic.  Weight has been stable overall.  Even though she has a decreased appetite she is still living.  She still has heartburn even on twice daily PPI.  She has had her hemoglobin checked by [facility].  Her most recent hemoglobin was in the 9.6 range on February.  Her other hemoglobins from [facility] are outlined below.  The patient has never had a deep enteroscopy.  GI Review of Systems Positive as above including mild dysphagia, bloating Negative for odynophagia, jaundice, hematochezia  Review of Systems General: Denies fevers/chills HEENT: Denies oral lesions Cardiovascular: Denies chest pain Pulmonary: Denies shortness of breath Gastroenterological: See HPI Genitourinary: Denies darkened urine or hematuria Hematological: Positive for  easy bruising/bleeding Dermatological: Denies jaundice the stools having issues again Psychological: Mood is anxious   Medications No current facility-administered medications for this visit.    Current Outpatient Medications  Medication Sig Dispense Refill  . allopurinol (ZYLOPRIM) 100 MG tablet Take 100 mg by mouth at bedtime.     .  ALPRAZolam (XANAX) 1 MG tablet Take 1 mg by mouth 2 (two) times daily as needed for anxiety.     Marland Kitchen atorvastatin (LIPITOR) 40 MG tablet Take 40 mg by mouth at bedtime.   3  . calcitRIOL (ROCALTROL) 0.5 MCG capsule Take 1 capsule (0.5 mcg total) by mouth daily with breakfast. (Patient taking differently: Take 0.5 mcg by mouth at bedtime. ) 30 capsule 0  . cinacalcet (SENSIPAR) 30 MG tablet Take 1 tablet (30 mg total) by mouth daily. (Patient taking differently: Take 30 mg by mouth at bedtime. ) 30 tablet 0  . diphenhydramine-acetaminophen (TYLENOL PM) 25-500 MG TABS tablet Take 2 tablets by mouth at bedtime as needed (sleep).     . metoprolol tartrate (LOPRESSOR) 25 MG tablet Take 25 mg by mouth at bedtime.     . multivitamin (RENA-VIT) TABS tablet Take 1 tablet by mouth at bedtime.     Marland Kitchen omeprazole (PRILOSEC) 40 MG capsule Take 40 mg by mouth 2 (two) times daily.    . ondansetron (ZOFRAN) 8 MG tablet Take 8 mg by mouth every 8 (eight) hours as needed for nausea or vomiting.    . senna (SENOKOT) 8.6 MG TABS tablet Take 2 tablets (17.2 mg total) by mouth at bedtime. (Patient not taking: Reported on 02/18/2018) 120 each 0  . sucroferric oxyhydroxide (VELPHORO) 500 MG chewable tablet Chew 1,000 mg by mouth 3 (three) times daily with meals.     . venlafaxine XR (EFFEXOR-XR) 75 MG 24 hr capsule Take 75 mg by mouth at bedtime.     Marland Kitchen esomeprazole (NEXIUM) 40 MG capsule Take 1 capsule (40 mg total) by mouth 2 (two) times daily. 60 capsule 2  . hydrOXYzine (ATARAX/VISTARIL) 25 MG tablet TK 1 T PO Q 6 H PRF ITCHING    . lisinopril (PRINIVIL,ZESTRIL) 10 MG tablet TK 1 T PO D    . prochlorperazine (COMPAZINE) 10 MG tablet Take 1 tablet (10 mg total) by mouth every 8 (eight) hours as needed for nausea or vomiting. (Patient not taking: Reported on 02/18/2018) 50 tablet 2  . prochlorperazine (COMPAZINE) 25 MG suppository Place 1 suppository (25 mg total) rectally every 12 (twelve) hours as needed for nausea or  vomiting. (Patient not taking: Reported on 02/18/2018) 12 suppository 1    Allergies Allergies  Allergen Reactions  . Nsaids Other (See Comments)    Cannot take due to Kidney disease/Kidney function   . Tolmetin Other (See Comments)    Cannot take due to Kidney Disease    Histories Past Medical History:  Diagnosis Date  . Acid reflux   . Anxiety   . Arthritis   . Dyspnea   . Dysrhythmia    tachycardia  . ESRD (end stage renal disease) ([place])    [place]SAT- [place]  . GI bleeding 12/2017  . Gout   . Headache(784.0)    "related to chemo; sometimes weekly" (09/12/2013)  . High cholesterol   . History of blood transfusion "a couple"   "related to low counts"  . Hypertension   . Iron deficiency anemia    "get epogen shots q month" (02/20/2014)  . MCGN (minimal change glomerulonephritis)    "using  chemo to tx;  finished my last tx in 12/2013"  . Peritoneal dialysis status [facility])    Past Surgical History:  Procedure Laterality Date  . ABDOMINAL HYSTERECTOMY  2010   "laparoscopic"  . ANKLE FRACTURE SURGERY Right 1994  . AV FISTULA PLACEMENT Left 04/14/2016   Procedure: ARTERIOVENOUS (AV) FISTULA CREATION;  Surgeon: Angelia Mould, MD;  Location: [facility] OR;  Service: Vascular;  Laterality: Left;  . AV FISTULA PLACEMENT Left 08/09/2017   Procedure: INSERTION OF ARTERIOVENOUS (AV) GORE-TEX GRAFT LEFT ARM;  Surgeon: Elam Dutch, MD;  Location: [place];  Service: Vascular;  Laterality: Left;  . [place] REMOVAL Left 08/11/2017   Procedure: REMOVAL OF ARTERIOVENOUS GORETEX GRAFT ([place]);  Surgeon: Serafina Mitchell, MD;  Location: [facility] OR;  Service: Vascular;  Laterality: Left;  . BIOPSY  09/03/2017   Procedure: BIOPSY;  Surgeon: Otis Brace, MD;  Location: [place];  Service: Gastroenterology;;  . BIOPSY  12/24/2017   Procedure: BIOPSY;  Surgeon: Irving Copas., MD;  Location: [place];  Service: Gastroenterology;;  . CARDIAC CATHETERIZATION  2000's  . COLONOSCOPY  WITH PROPOFOL N/A 09/04/2017   Procedure: COLONOSCOPY WITH PROPOFOL;  Surgeon: Ronnette Juniper, MD;  Location: [place];  Service: Gastroenterology;  Laterality: N/A;  . ENTEROSCOPY N/A 12/24/2017   Procedure: ENTEROSCOPY;  Surgeon: Rush Landmark Telford Nab., MD;  Location: [place];  Service: Gastroenterology;  Laterality: N/A;  . ESOPHAGOGASTRODUODENOSCOPY    . ESOPHAGOGASTRODUODENOSCOPY (EGD) WITH PROPOFOL Left 06/04/2017   Procedure: ESOPHAGOGASTRODUODENOSCOPY (EGD) WITH PROPOFOL;  Surgeon: Arta Silence, MD;  Location: [place];  Service: Endoscopy;  Laterality: Left;  . ESOPHAGOGASTRODUODENOSCOPY (EGD) WITH PROPOFOL N/A 09/03/2017   Procedure: ESOPHAGOGASTRODUODENOSCOPY (EGD) WITH PROPOFOL;  Surgeon: Otis Brace, MD;  Location: [place];  Service: Gastroenterology;  Laterality: N/A;  . FISTULA SUPERFICIALIZATION Left 04/02/2017   Procedure: FISTULA SUPERFICIALIZATION LEFT ARM;  Surgeon: Serafina Mitchell, MD;  Location: [place];  Service: Vascular;  Laterality: Left;  . FISTULOGRAM Left 04/07/2016   Procedure: FISTULOGRAM;  Surgeon: Waynetta Sandy, MD;  Location: [place];  Service: Vascular;  Laterality: Left;  . FRACTURE SURGERY     right ankle  . GIVENS CAPSULE STUDY N/A 10/29/2017   Procedure: GIVENS CAPSULE STUDY;  Surgeon: Wilford Corner, MD;  Location: [place];  Service: Endoscopy;  Laterality: N/A;  . GIVENS CAPSULE STUDY N/A 12/24/2017   Procedure: GIVENS CAPSULE STUDY;  Surgeon: Irving Copas., MD;  Location: [place];  Service: Gastroenterology;  Laterality: N/A;  . INSERTION OF DIALYSIS CATHETER Right 04/07/2016   Procedure: INSERTION OF DIALYSIS CATHETER;  Surgeon: Waynetta Sandy, MD;  Location: [place];  Service: Vascular;  Laterality: Right;  . INSERTION OF DIALYSIS CATHETER Right 04/04/2017   Procedure: INSERTION OF DIALYSIS CATHETER;  Surgeon: Waynetta Sandy, MD;  Location: [place];  Service: Vascular;  Laterality: Right;   . LIGATION OF ARTERIOVENOUS  FISTULA Left 04/14/2016   Procedure: LIGATION OF ARTERIOVENOUS  FISTULA;  Surgeon: Angelia Mould, MD;  Location: [place];  Service: Vascular;  Laterality: Left;  . PATCH ANGIOPLASTY Left 06/19/2016   Procedure: PATCH ANGIOPLASTY LEFT ARTERIOVENOUS FISTULA;  Surgeon: Angelia Mould, MD;  Location: [place];  Service: Vascular;  Laterality: Left;  . POLYPECTOMY  09/04/2017   Procedure: POLYPECTOMY;  Surgeon: Ronnette Juniper, MD;  Location: [facility] ENDOSCOPY;  Service: Gastroenterology;;  . REVISON OF ARTERIOVENOUS FISTULA Left 06/19/2016   Procedure: REVISION SUPERFICIALIZATION OF BRACHIOCEPHALIC ARTERIOVENOUS FISTULA;  Surgeon: Angelia Mould, MD;  Location: [place];  Service: Vascular;  Laterality: Left;   Social History   Socioeconomic History  . Marital status: Single    Spouse name: Not on file  . Number of children: Not on file  . Years of education: Not on file  . Highest education level: Not on file  Occupational History  . Occupation: disabled  Social Needs  . Financial resource strain: Not on file  . Food insecurity:    Worry: Not on file    Inability: Not on file  . Transportation needs:    Medical: Not on file    Non-medical: Not on file  Tobacco Use  . Smoking status: Never Smoker  . Smokeless tobacco: Never Used  Substance and Sexual Activity  . Alcohol use: No  . Drug use: No  . Sexual activity: Not Currently    Birth control/protection: Surgical  Lifestyle  . Physical activity:    Days per week: Not on file    Minutes per session: Not on file  . Stress: Not on file  Relationships  . Social connections:    Talks on phone: Not on file    Gets together: Not on file    Attends religious service: Not on file    Active member of club or organization: Not on file    Attends meetings of clubs or organizations: Not on file    Relationship status: Not on file  . Intimate partner violence:    Fear of current or ex partner: No     Emotionally abused: No    Physically abused: No    Forced sexual activity: No  Other Topics Concern  . Not on file  Social History Narrative  . Not on file   Family History  Problem Relation Age of Onset  . Hypertension Mother   . Thyroid disease Mother   . Coronary artery disease Father   . Hypertension Father   . Diabetes Father   . Colon cancer Maternal Aunt   . Esophageal cancer Maternal Uncle   . Inflammatory bowel disease Neg Hx   . Liver disease Neg Hx   . Pancreatic cancer Neg Hx   . Rectal cancer Neg Hx   . Stomach cancer Neg Hx    I have reviewed her medical, social, and family history in detail and updated the electronic medical record as necessary.    PHYSICAL EXAMINATION  BP 132/90   Pulse 82   Ht 5\' 7"  (1.702 m)   Wt 265 lb (120.2 kg)   BMI 41.50 kg/m  Wt Readings from Last 3 Encounters:  02/18/18 265 lb (120.2 kg)  02/17/18 265 lb (120.2 kg)  12/25/17 281 lb 4.9 oz (127.6 kg)  GEN: NAD, appears stated age, doesn't appear chronically ill PSYCH: Cooperative, without pressured speech EYE: Conjunctivae pink, sclerae anicteric ENT: MMM, without oral ulcers, no erythema or exudates noted CV: RR without R/Gs  RESP: CTAB posteriorly, without wheezing GI: NABS, soft, obese, rounded, TTP in the midepigastrium and LLQ, PD catheter port in place, without rebound or guarding GU: DRE deferred by patient MSK/EXT: BLE edema present SKIN: No jaundice NEURO:  Alert & Oriented x 3, no focal deficits  REVIEW OF DATA  I reviewed the following data at the time of this encounter:  GI Procedures and Studies  December 2019 SBE - No gross lesions in esophagus. Z-line irregular, 38 cm from the incisors. - Non-bleeding gastric ulcer with a clean ulcer base ([place] Class III). Non-bleeding erosive gastropathy. No other gross lesions in  the stomach. Biopsied for HP. - Erythematous duodenopathy in bulb. - Normal mucosa was found in the second portion of the duodenum, in the  third portion of the duodenum and in the fourth portion of the duodenum. - Normal mucosa was found in the jejunum. Tattooed distal extent to demarcate distal extent of Push SBE. - Successful completion of the Video Capsule Enteroscope placement.  December 2019 video capsule endoscopy Incomplete video capsule endoscopy as capsule was retained in the small bowel during last image at 13 hours There are 2-3 small, nonbleeding AVMs noted between 2 hours and 34 minutes and 3 hours and 9 minutes.  (These are more than 2 hours beyond the first duodenal image)  [facility] Procedures 05/2017 EGD.  By Dr. Willis Modena for eval of abdominal pain, melena, anemia.  Noted medium hiatal hernia.  Patchy, moderate inflammation with congestion/edema, erosions, shallow ulcerations in the prepyloric region focal mild inflammation/edema and erythema in the duodenal bulb.  H Pylori serologies:  09/03/2017 EGD by Dr. Kathi Der.  For recurrent melena.  Nonobstructing Schatzki's ring in GE junction.  Small hiatal hernia.  Abnormal esophageal motility.  Mild gastritis in the prepylorus.  Scattered mild inflammation in the duodenal bulb.  Pathology showed benign gastric mucosa, no H. pylori. 09/04/2017 colonoscopy.  By Dr. Marca Ancona.  Patchy edema/erythema in the sigmoid, transverse and descending colon.  Mucosal friability in the ascending colon with minimal fresh blood in the cecum and a sending colon.  No active bleeding during or after thorough lavage.  A few small mouth diverticula in the sigmoid and descending colon 2 sessile polyps, 3 to 8 mm in size, removed from the transverse colon.  Polyp pathology: sessile serrated, no high-grade dysplasia or malignancy; no pathology pertaining to the erythematous mucosa.  11/02/16 Capsule endoscopy.  Findings included mild gastritis and duodenitis.  Nonspecific focal nodular area in the very distal small bowel.  "Obscure GI bleed, no source of anemia seen on capsule endoscopy; anemia  source likely multifactorial from ESRD and other factors. (recommend) Hematology outpatient consult for further evaluation of anemia"  Laboratory Studies  Reviewed in epic  [facility] 02/09/18 Hgb 9.0 1/20 Hgb 10.9 12/27 Hgb 10.7 12/15 Hgb 8.4  Imaging Studies  December 2019 CT abdomen pelvis with contrast IMPRESSION: No acute abdominal or pelvic abnormality.   ASSESSMENT  Patricia [name] is a 43 y.o. 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  The patient is seen today for evaluation and management of:  1. Anemia, unspecified type   2. Dark stools   3. AVM (arteriovenous malformation) of small bowel, acquired   4. Acute gastric ulcer without hemorrhage or perforation   5. Nausea without vomiting   6. Early satiety    This is a hemodynamically stable patient who presents for hospital follow-up.  Over the course of the last week or so the patient however has experienced what she is describing recurrent dark stools similar in quality to what led her to the hospital the last few times in the last year.  She has just recently received her Epogen injection.  Her anemia is most likely multifactorial however with the recurrence of the dark stools I am worried about the possibility of bleeding angiectasia's.  Unfortunately both that were noted previously are beyond the reach of a push enteroscopy and will require a single or double balloon enteroscopy for potential reaching.  We should expedite  a endoscopic evaluation to try and understand things for her.  Would also like to understand what her iron indices and B12 and folate are as they have not been checked recently.  This would be a good time since she has not required any transfusions for over 2 months.  The etiology of her chronic abdominal pain is not clear.  However her nausea without vomiting even though she is not  diabetic should be considered the possibility of gastroparesis and we will further explore that with a solid-phase gastric emptying study.  I would like to repeat her endoscopy as well so that we can ensure that her gastric ulcer has healed.  I considered the role of repeating her colonoscopy however it has just recently been done with a good preparation per her report back and plan more for enteroscopy and consideration of repeating a capsule.  We need to also ensure that she has passed her video capsule endoscopy and we had previously put in a KUB for which she will go later today and have completed.  Further work-up based on the findings of the blood counts and her upcoming endoscopy/enteroscopy.  The risks and benefits of endoscopic evaluation were discussed with the patient; these include but are not limited to the risk of perforation, infection, bleeding, missed lesions, lack of diagnosis, severe illness requiring hospitalization, as well as anesthesia and sedation related illnesses.  The patient is agreeable to proceed.  All patient questions were answered, to the best of my ability, and the patient agrees to the aforementioned plan of action with follow-up as indicated.   PLAN  Laboratories as outlined below Proceed with scheduling endoscopy/enteroscopy and LAC at next available If dropping hemoglobin will definitely need to get endoscopy/enteroscopy moved up The ability to proceed with single balloon enteroscopy by myself (tell Elta [place] of this year thus if she has significant history is we may have to have her be transferred or evaluated at [facility] for double-balloon Solid-phase gastric emptying study to be ordered Continue Epogen shots as per Jud This Encounter  Procedures  . NM Gastric Emptying  . CBC  . IBC panel  . Ferritin  . B12  . Folate  . Ambulatory referral to Gastroenterology    New Prescriptions   ESOMEPRAZOLE (NEXIUM) 40 MG CAPSULE    Take 1 capsule  (40 mg total) by mouth 2 (two) times daily.   PROCHLORPERAZINE (COMPAZINE) 10 MG TABLET    Take 1 tablet (10 mg total) by mouth every 8 (eight) hours as needed for nausea or vomiting.   PROCHLORPERAZINE (COMPAZINE) 25 MG SUPPOSITORY    Place 1 suppository (25 mg total) rectally every 12 (twelve) hours as needed for nausea or vomiting.   Modified Medications   No medications on file    Planned Follow Up: No follow-ups on file.   Justice Britain, MD [facility] Advanced Endoscopy Office # [phone]

## 2018-02-17 NOTE — Patient Instructions (Signed)
Your provider has requested that you go to the basement level for lab work before leaving today. Press "B" on the elevator. The lab is located at the first door on the left as you exit the elevator.  Please also have KUB xray done today.  You have been scheduled for an endoscopy. Please follow written instructions given to you at your visit today. If you use inhalers (even only as needed), please bring them with you on the day of your procedure. Your physician has requested that you go to www.startemmi.com and enter the access code given to you at your visit today. This web site gives a general overview about your procedure. However, you should still follow specific instructions given to you by our office regarding your preparation for the procedure.  We have sent the following medications to your pharmacy for you to pick up at your convenience: Nexium and Compazine    Stop omeprazole.  Start Nexium $RemoveBefo'40mg'[id number]$  twice daily.  Stop Phenergan.  Start Compazine as directed.   You have been scheduled for a gastric emptying scan at [facility] on 03/03/2018 at 7:30am. Please arrive at least 15 minutes prior to your appointment for registration. Please make certain not to have anything to eat or drink after midnight the night before your test. Hold all stomach medications (ex: Zofran, phenergan, Reglan) 8 hours prior to your test. If you need to reschedule your appointment, please contact radiology scheduling at [phone]. _____________________________________________________________________ A gastric-emptying study measures how long it takes for food to move through your stomach. There are several ways to measure stomach emptying. In the most common test, you eat food that contains a small amount of radioactive material. A scanner that detects the movement of the radioactive material is placed over your abdomen to monitor the rate at which food leaves your stomach. This test normally takes  about 4 hours to complete. _____________________________________________________________________  Thank you for choosing me and [facility].  Dr. Rush Landmark

## 2018-02-18 ENCOUNTER — Encounter: Payer: Self-pay | Admitting: Gastroenterology

## 2018-02-18 ENCOUNTER — Ambulatory Visit (AMBULATORY_SURGERY_CENTER): Payer: Medicare Other | Admitting: Gastroenterology

## 2018-02-18 ENCOUNTER — Other Ambulatory Visit: Payer: Self-pay

## 2018-02-18 ENCOUNTER — Encounter (HOSPITAL_COMMUNITY): Payer: Self-pay

## 2018-02-18 ENCOUNTER — Inpatient Hospital Stay (HOSPITAL_COMMUNITY)
Admission: EM | Admit: 2018-02-18 | Discharge: 2018-02-27 | DRG: 377 | Disposition: A | Discharge status: Home Health | Payer: Medicare Other | Source: Ambulatory Visit | Attending: Internal Medicine | Admitting: Internal Medicine

## 2018-02-18 ENCOUNTER — Telehealth: Payer: Self-pay | Admitting: Gastroenterology

## 2018-02-18 ENCOUNTER — Other Ambulatory Visit (INDEPENDENT_AMBULATORY_CARE_PROVIDER_SITE_OTHER): Payer: Medicare Other

## 2018-02-18 VITALS — BP 141/76 | HR 76 | Temp 98.4°F | Resp 16 | Ht 67.0 in | Wt 265.0 lb

## 2018-02-18 DIAGNOSIS — K5900 Constipation, unspecified: Secondary | ICD-10-CM | POA: Diagnosis not present

## 2018-02-18 DIAGNOSIS — K449 Diaphragmatic hernia without obstruction or gangrene: Secondary | ICD-10-CM

## 2018-02-18 DIAGNOSIS — Z8719 Personal history of other diseases of the digestive system: Secondary | ICD-10-CM

## 2018-02-18 DIAGNOSIS — D649 Anemia, unspecified: Secondary | ICD-10-CM | POA: Diagnosis present

## 2018-02-18 DIAGNOSIS — K5520 Angiodysplasia of colon without hemorrhage: Secondary | ICD-10-CM | POA: Diagnosis present

## 2018-02-18 DIAGNOSIS — K5521 Angiodysplasia of colon with hemorrhage: Secondary | ICD-10-CM | POA: Diagnosis present

## 2018-02-18 DIAGNOSIS — Z833 Family history of diabetes mellitus: Secondary | ICD-10-CM

## 2018-02-18 DIAGNOSIS — R109 Unspecified abdominal pain: Secondary | ICD-10-CM

## 2018-02-18 DIAGNOSIS — E7800 Pure hypercholesterolemia, unspecified: Secondary | ICD-10-CM | POA: Diagnosis present

## 2018-02-18 DIAGNOSIS — E876 Hypokalemia: Secondary | ICD-10-CM | POA: Diagnosis not present

## 2018-02-18 DIAGNOSIS — Z800 Family history of malignant neoplasm of digestive organs: Secondary | ICD-10-CM

## 2018-02-18 DIAGNOSIS — K259 Gastric ulcer, unspecified as acute or chronic, without hemorrhage or perforation: Secondary | ICD-10-CM | POA: Diagnosis not present

## 2018-02-18 DIAGNOSIS — Z886 Allergy status to analgesic agent status: Secondary | ICD-10-CM

## 2018-02-18 DIAGNOSIS — I120 Hypertensive chronic kidney disease with stage 5 chronic kidney disease or end stage renal disease: Secondary | ICD-10-CM | POA: Diagnosis present

## 2018-02-18 DIAGNOSIS — N2581 Secondary hyperparathyroidism of renal origin: Secondary | ICD-10-CM | POA: Diagnosis present

## 2018-02-18 DIAGNOSIS — D631 Anemia in chronic kidney disease: Secondary | ICD-10-CM | POA: Diagnosis present

## 2018-02-18 DIAGNOSIS — K3189 Other diseases of stomach and duodenum: Secondary | ICD-10-CM | POA: Diagnosis not present

## 2018-02-18 DIAGNOSIS — D508 Other iron deficiency anemias: Secondary | ICD-10-CM | POA: Diagnosis not present

## 2018-02-18 DIAGNOSIS — Y841 Kidney dialysis as the cause of abnormal reaction of the patient, or of later complication, without mention of misadventure at the time of the procedure: Secondary | ICD-10-CM | POA: Diagnosis not present

## 2018-02-18 DIAGNOSIS — Z8349 Family history of other endocrine, nutritional and metabolic diseases: Secondary | ICD-10-CM

## 2018-02-18 DIAGNOSIS — K921 Melena: Secondary | ICD-10-CM | POA: Diagnosis present

## 2018-02-18 DIAGNOSIS — M109 Gout, unspecified: Secondary | ICD-10-CM | POA: Diagnosis present

## 2018-02-18 DIAGNOSIS — F329 Major depressive disorder, single episode, unspecified: Secondary | ICD-10-CM | POA: Diagnosis present

## 2018-02-18 DIAGNOSIS — E871 Hypo-osmolality and hyponatremia: Secondary | ICD-10-CM | POA: Diagnosis not present

## 2018-02-18 DIAGNOSIS — R1032 Left lower quadrant pain: Secondary | ICD-10-CM | POA: Diagnosis not present

## 2018-02-18 DIAGNOSIS — N186 End stage renal disease: Secondary | ICD-10-CM

## 2018-02-18 DIAGNOSIS — Z79899 Other long term (current) drug therapy: Secondary | ICD-10-CM

## 2018-02-18 DIAGNOSIS — K659 Peritonitis, unspecified: Secondary | ICD-10-CM | POA: Diagnosis not present

## 2018-02-18 DIAGNOSIS — R6881 Early satiety: Secondary | ICD-10-CM | POA: Insufficient documentation

## 2018-02-18 DIAGNOSIS — K219 Gastro-esophageal reflux disease without esophagitis: Secondary | ICD-10-CM | POA: Diagnosis present

## 2018-02-18 DIAGNOSIS — D62 Acute posthemorrhagic anemia: Secondary | ICD-10-CM

## 2018-02-18 DIAGNOSIS — Z8249 Family history of ischemic heart disease and other diseases of the circulatory system: Secondary | ICD-10-CM

## 2018-02-18 DIAGNOSIS — E785 Hyperlipidemia, unspecified: Secondary | ICD-10-CM | POA: Diagnosis present

## 2018-02-18 DIAGNOSIS — K253 Acute gastric ulcer without hemorrhage or perforation: Secondary | ICD-10-CM

## 2018-02-18 DIAGNOSIS — F419 Anxiety disorder, unspecified: Secondary | ICD-10-CM | POA: Diagnosis present

## 2018-02-18 DIAGNOSIS — Z6841 Body Mass Index (BMI) 40.0 and over, adult: Secondary | ICD-10-CM

## 2018-02-18 DIAGNOSIS — T8571XA Infection and inflammatory reaction due to peritoneal dialysis catheter, initial encounter: Secondary | ICD-10-CM | POA: Diagnosis present

## 2018-02-18 DIAGNOSIS — K650 Generalized (acute) peritonitis: Secondary | ICD-10-CM | POA: Diagnosis not present

## 2018-02-18 DIAGNOSIS — D500 Iron deficiency anemia secondary to blood loss (chronic): Secondary | ICD-10-CM | POA: Diagnosis present

## 2018-02-18 DIAGNOSIS — Z992 Dependence on renal dialysis: Secondary | ICD-10-CM

## 2018-02-18 DIAGNOSIS — Z888 Allergy status to other drugs, medicaments and biological substances status: Secondary | ICD-10-CM

## 2018-02-18 DIAGNOSIS — Z8673 Personal history of transient ischemic attack (TIA), and cerebral infarction without residual deficits: Secondary | ICD-10-CM

## 2018-02-18 DIAGNOSIS — Z8711 Personal history of peptic ulcer disease: Secondary | ICD-10-CM | POA: Diagnosis not present

## 2018-02-18 DIAGNOSIS — F339 Major depressive disorder, recurrent, unspecified: Secondary | ICD-10-CM | POA: Diagnosis not present

## 2018-02-18 DIAGNOSIS — R195 Other fecal abnormalities: Secondary | ICD-10-CM | POA: Insufficient documentation

## 2018-02-18 DIAGNOSIS — Z803 Family history of malignant neoplasm of breast: Secondary | ICD-10-CM

## 2018-02-18 LAB — COMPREHENSIVE METABOLIC PANEL
ALK PHOS: 42 U/L (ref 38–126)
ALT: 27 U/L (ref 0–44)
AST: 33 U/L (ref 15–41)
Albumin: 3.4 g/dL — ABNORMAL LOW (ref 3.5–5.0)
Anion gap: 21 — ABNORMAL HIGH (ref 5–15)
BUN: 51 mg/dL — ABNORMAL HIGH (ref 6–20)
CO2: 20 mmol/L — ABNORMAL LOW (ref 22–32)
Calcium: 8.1 mg/dL — ABNORMAL LOW (ref 8.9–10.3)
Chloride: 98 mmol/L (ref 98–111)
Creatinine, Ser: 14.47 mg/dL — ABNORMAL HIGH (ref 0.44–1.00)
GFR calc Af Amer: 3 mL/min — ABNORMAL LOW (ref >60)
GFR calc non Af Amer: 3 mL/min — ABNORMAL LOW (ref >60)
Glucose, Bld: 99 mg/dL (ref 70–99)
Potassium: 3.6 mmol/L (ref 3.5–5.1)
Sodium: 139 mmol/L (ref 135–145)
Total Bilirubin: 0.5 mg/dL (ref 0.3–1.2)
Total Protein: 6.9 g/dL (ref 6.5–8.1)

## 2018-02-18 LAB — CBC WITH DIFFERENTIAL/PLATELET
BASOS ABS: 0.1 10*3/uL (ref 0.0–0.1)
BASOS PCT: 0.9 % (ref 0.0–3.0)
EOS ABS: 0.1 10*3/uL (ref 0.0–0.7)
Eosinophils Relative: 1.5 % (ref 0.0–5.0)
HCT: 25 % — ABNORMAL LOW (ref 36.0–46.0)
Hemoglobin: 8.3 g/dL — ABNORMAL LOW (ref 12.0–15.0)
Lymphocytes Relative: 23.7 % (ref 12.0–46.0)
Lymphs Abs: 2.1 10*3/uL (ref 0.7–4.0)
MCHC: 33.2 g/dL (ref 30.0–36.0)
MCV: 90.3 fl (ref 78.0–100.0)
Monocytes Absolute: 0.7 10*3/uL (ref 0.1–1.0)
Monocytes Relative: 7.6 % (ref 3.0–12.0)
Neutro Abs: 5.8 10*3/uL (ref 1.4–7.7)
Neutrophils Relative %: 66.3 % (ref 43.0–77.0)
PLATELETS: 256 10*3/uL (ref 150.0–400.0)
RBC: 2.77 Mil/uL — ABNORMAL LOW (ref 3.87–5.11)
RDW: 17.2 % — AB (ref 11.5–15.5)
WBC: 8.7 10*3/uL (ref 4.0–10.5)

## 2018-02-18 LAB — CBC
HCT: 27.3 % — ABNORMAL LOW (ref 36.0–46.0)
Hemoglobin: 8.4 g/dL — ABNORMAL LOW (ref 12.0–15.0)
MCH: 28.7 pg (ref 26.0–34.0)
MCHC: 30.8 g/dL (ref 30.0–36.0)
MCV: 93.2 fL (ref 80.0–100.0)
Platelets: 275 10*3/uL (ref 150–400)
RBC: 2.93 MIL/uL — ABNORMAL LOW (ref 3.87–5.11)
RDW: 16.7 % — ABNORMAL HIGH (ref 11.5–15.5)
WBC: 9 10*3/uL (ref 4.0–10.5)
nRBC: 0.8 % — ABNORMAL HIGH (ref 0.0–0.2)

## 2018-02-18 LAB — TYPE AND SCREEN
ABO/RH(D): O NEG
Antibody Screen: NEGATIVE

## 2018-02-18 LAB — CREATININE, SERUM
Creatinine, Ser: 14.93 mg/dL — ABNORMAL HIGH (ref 0.44–1.00)
GFR calc Af Amer: 3 mL/min — ABNORMAL LOW (ref >60)
GFR calc non Af Amer: 3 mL/min — ABNORMAL LOW (ref >60)

## 2018-02-18 MED ORDER — SODIUM CHLORIDE 0.9 % IV SOLN: 250.0000 mL | INTRAVENOUS | Status: DC | PRN

## 2018-02-18 MED ORDER — SODIUM CHLORIDE 0.9% FLUSH
3.0000 mL | Freq: Two times a day (BID) | INTRAVENOUS | Status: DC
  Administered 2018-02-19 – 2018-02-27 (×12): 3 mL via INTRAVENOUS

## 2018-02-18 MED ORDER — CALCITRIOL 0.5 MCG PO CAPS
0.5000 ug | ORAL_CAPSULE | Freq: Every day | ORAL | Status: DC
  Administered 2018-02-18 – 2018-02-26 (×9): 0.5 ug via ORAL
  Filled 2018-02-18 (×10): qty 1

## 2018-02-18 MED ORDER — ALPRAZOLAM 0.5 MG PO TABS
1.0000 mg | ORAL_TABLET | Freq: Two times a day (BID) | ORAL | Status: DC | PRN
  Administered 2018-02-18 – 2018-02-19 (×2): 1 mg via ORAL
  Filled 2018-02-18 (×2): qty 2

## 2018-02-18 MED ORDER — METOPROLOL TARTRATE 25 MG PO TABS
25.0000 mg | ORAL_TABLET | Freq: Every day | ORAL | Status: DC
  Administered 2018-02-18 – 2018-02-26 (×9): 25 mg via ORAL
  Filled 2018-02-18 (×9): qty 1

## 2018-02-18 MED ORDER — ONDANSETRON HCL 4 MG PO TABS
4.0000 mg | ORAL_TABLET | Freq: Four times a day (QID) | ORAL | Status: DC | PRN
  Administered 2018-02-19 – 2018-02-20 (×2): 4 mg via ORAL
  Filled 2018-02-18 (×2): qty 1

## 2018-02-18 MED ORDER — GENTAMICIN SULFATE 0.1 % EX CREA
1.0000 "application " | TOPICAL_CREAM | Freq: Every day | CUTANEOUS | Status: DC
  Administered 2018-02-18 – 2018-02-26 (×10): 1 via TOPICAL
  Filled 2018-02-18 (×3): qty 15

## 2018-02-18 MED ORDER — RENA-VITE PO TABS
1.0000 | ORAL_TABLET | Freq: Every day | ORAL | Status: DC
  Administered 2018-02-18 – 2018-02-26 (×9): 1 via ORAL
  Filled 2018-02-18 (×9): qty 1

## 2018-02-18 MED ORDER — HYDROXYZINE HCL 25 MG PO TABS
25.0000 mg | ORAL_TABLET | Freq: Four times a day (QID) | ORAL | Status: DC | PRN
  Administered 2018-02-18 – 2018-02-26 (×12): 25 mg via ORAL
  Filled 2018-02-18 (×13): qty 1

## 2018-02-18 MED ORDER — ATORVASTATIN CALCIUM 40 MG PO TABS
40.0000 mg | ORAL_TABLET | Freq: Every day | ORAL | Status: DC
  Administered 2018-02-18 – 2018-02-26 (×9): 40 mg via ORAL
  Filled 2018-02-18 (×9): qty 1

## 2018-02-18 MED ORDER — ACETAMINOPHEN 650 MG RE SUPP: 650.0000 mg | Freq: Four times a day (QID) | RECTAL | Status: DC | PRN

## 2018-02-18 MED ORDER — OCTREOTIDE ACETATE 20 MG IM KIT
10.0000 mg | PACK | Freq: Once | INTRAMUSCULAR | Status: AC
  Administered 2018-02-18: 10 mg via INTRAMUSCULAR
  Filled 2018-02-18: qty 1

## 2018-02-18 MED ORDER — HEPARIN SODIUM (PORCINE) 5000 UNIT/ML IJ SOLN: 5000.0000 [IU] | Freq: Three times a day (TID) | INTRAMUSCULAR | Status: DC

## 2018-02-18 MED ORDER — ALLOPURINOL 100 MG PO TABS
100.0000 mg | ORAL_TABLET | Freq: Every day | ORAL | Status: DC
  Administered 2018-02-18 – 2018-02-26 (×9): 100 mg via ORAL
  Filled 2018-02-18 (×9): qty 1

## 2018-02-18 MED ORDER — OCTREOTIDE ACETATE 50 MCG/ML IJ SOLN
50.0000 ug | Freq: Three times a day (TID) | INTRAMUSCULAR | Status: DC
  Filled 2018-02-18: qty 1

## 2018-02-18 MED ORDER — HEPARIN 1000 UNIT/ML FOR PERITONEAL DIALYSIS: 500.0000 [IU] | INTRAMUSCULAR | Status: DC | PRN

## 2018-02-18 MED ORDER — CINACALCET HCL 30 MG PO TABS
30.0000 mg | ORAL_TABLET | Freq: Every day | ORAL | Status: DC
  Administered 2018-02-18 – 2018-02-26 (×8): 30 mg via ORAL
  Filled 2018-02-18 (×9): qty 1

## 2018-02-18 MED ORDER — ACETAMINOPHEN 325 MG PO TABS
650.0000 mg | ORAL_TABLET | Freq: Four times a day (QID) | ORAL | Status: DC | PRN
  Administered 2018-02-19 – 2018-02-21 (×3): 650 mg via ORAL
  Filled 2018-02-18 (×3): qty 2

## 2018-02-18 MED ORDER — ONDANSETRON HCL 4 MG/2ML IJ SOLN
4.0000 mg | Freq: Four times a day (QID) | INTRAMUSCULAR | Status: DC | PRN
  Administered 2018-02-19: 4 mg via INTRAVENOUS
  Filled 2018-02-18: qty 2

## 2018-02-18 MED ORDER — SODIUM CHLORIDE 0.9 % IV SOLN: 500.0000 mL | Freq: Once | INTRAVENOUS | Status: DC

## 2018-02-18 MED ORDER — VENLAFAXINE HCL ER 75 MG PO CP24
75.0000 mg | ORAL_CAPSULE | Freq: Every day | ORAL | Status: DC
  Administered 2018-02-18 – 2018-02-26 (×9): 75 mg via ORAL
  Filled 2018-02-18 (×10): qty 1

## 2018-02-18 MED ORDER — SENNOSIDES-DOCUSATE SODIUM 8.6-50 MG PO TABS: 1.0000 | ORAL_TABLET | Freq: Every evening | ORAL | Status: DC | PRN

## 2018-02-18 MED ORDER — SODIUM CHLORIDE 0.9% FLUSH
3.0000 mL | Freq: Two times a day (BID) | INTRAVENOUS | Status: DC
  Administered 2018-02-18 – 2018-02-21 (×3): 3 mL via INTRAVENOUS

## 2018-02-18 MED ORDER — HEPARIN 1000 UNIT/ML FOR PERITONEAL DIALYSIS
INTRAPERITONEAL | Status: DC | PRN
  Administered 2018-02-18: 21:00:00 via INTRAPERITONEAL
  Filled 2018-02-18 (×6): qty 5000

## 2018-02-18 MED ORDER — SODIUM CHLORIDE 0.9% FLUSH: 3.0000 mL | INTRAVENOUS | Status: DC | PRN

## 2018-02-18 MED ORDER — PHENOL 1.4 % MT LIQD
1.0000 | OROMUCOSAL | Status: DC | PRN
  Administered 2018-02-18: 1 via OROMUCOSAL
  Filled 2018-02-18: qty 177

## 2018-02-18 MED ORDER — PANTOPRAZOLE SODIUM 40 MG PO TBEC
40.0000 mg | DELAYED_RELEASE_TABLET | Freq: Two times a day (BID) | ORAL | Status: DC
  Administered 2018-02-18 – 2018-02-27 (×18): 40 mg via ORAL
  Filled 2018-02-18 (×18): qty 1

## 2018-02-18 NOTE — Progress Notes (Signed)
Report given to PACU, vss 

## 2018-02-18 NOTE — Progress Notes (Signed)
[place] LPN notified and asked to put a stat CBC in to be drawn at patient's bedside.  928- pt c/o lower inner lip discomfort- area slightly swollen, no blood noted or sign of abrasion.  Will monitor.   931- pt has been resting quietly with eyes closed while in RR.  When awakened- does c/o abdominal discomfort.  Dr. Rush Landmark here to assess pt and discomfot.  Pt back to resting with eyes closed at this time.    933- lab at bedside to draw bloodwork- asked to call me with lab results ASAP  1008- ok received per Dr. Rush Landmark to draw blood from ankle  1018- lab at bedside to get labwork from ankle/foot

## 2018-02-18 NOTE — ED Provider Notes (Signed)
[facility] EMERGENCY DEPARTMENT Provider Note   CSN: [id number] Arrival date & time: 02/18/18  1223     History   Chief Complaint Chief Complaint  Patient presents with  . Abnormal Lab    HPI Patricia [name] is a 43 y.o. female with a PMHx of ESRD on peritoneal dialysis every night (last done last night), HLD, HTN, anemia, and other conditions listed below, who presents to the ED with complaints of low hgb. Pt had endoscopy today by Dr. Rush Landmark at [facility] which was negative for UGI source of bleeding, her hgb was checked and found to be 8.3, down from 8.8 yesterday and 9.9 in December 2019. She was sent here for admission to monitor her Hgb.  Patient states that her hemoglobin when checked earlier this month was in the 9 range, and previous to that it was about 10.  She states that she had a melanotic stool this morning which was black and tarry.  She endorses feeling generally weak and fatigued, having occasional dyspnea on exertion, and intermittent generalized abdominal cramping which has been going on for quite some time.  She also reports nausea which has also been going on for some time.  Her symptoms worsen with activity, with no treatments tried or known alleviating factors.  She is not on any blood thinners.  She denies any lightheadedness, fevers, chills, chest pain, shortness of breath at rest, vomiting, diarrhea, constipation, hematochezia, numbness, tingling, focal weakness, or any other complaints at this time.  She does not make urine.  Her PCP is at [facility].  The history is provided by the patient and medical records. No language interpreter was used.  Abnormal Lab    Past Medical History:  Diagnosis Date  . Acid reflux   . Anxiety   . Arthritis   . Dyspnea   . Dysrhythmia    tachycardia  . ESRD (end stage renal disease) ([place])    [place]- [place]  . GI bleeding 12/2017  . Gout   . Headache(784.0)    "related to chemo; sometimes  weekly" (09/12/2013)  . High cholesterol   . History of blood transfusion "a couple"   "related to low counts"  . Hypertension   . Iron deficiency anemia    "get epogen shots q month" (02/20/2014)  . 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 Peritoneal dialysis status [facility])     Patient Active Problem List   Diagnosis Date Noted  . Acute GI bleeding 12/23/2017  . Symptomatic anemia 10/28/2017  . Esophageal candidiasis ([place]) 09/01/2017  . Anxiety 09/01/2017  . Stroke ([place]) 08/15/2017  . Dysphagia 08/15/2017  . Melena 06/03/2017  . Abdominal pain 06/03/2017  . Dialysis AV fistula malfunction ([place]) 04/04/2017  . ESRD on peritoneal dialysis ([place]) 04/01/2016  . Anemia due to chronic kidney disease 04/01/2016  . GERD (gastroesophageal reflux disease) 04/01/2016  . Hypomagnesemia 04/28/2015  . Acute renal failure superimposed on stage 3 chronic kidney disease ([place]) 04/12/2014  . Atypical chest pain   . Chest pain 02/19/2014  . Hypokalemia 02/19/2014  . Nephrotic syndrome 10/02/2013  . Dyslipidemia 10/02/2013  . PAT (paroxysmal atrial tachycardia) ([place]) 09/29/2013  . Normocytic anemia 06/29/2013  . Edema 06/29/2013  . Nausea and vomiting 06/29/2013  . Fever, unspecified 01/31/2013  . Glomerulonephritis 01/28/2013  . Syncope 01/28/2013  . Shortness of breath 07/04/2011  . Morbid obesity ([place]) 07/04/2011  . HTN (hypertension)  07/04/2011  . Hypercholesterolemia 07/04/2011    Past Surgical History:  Procedure Laterality Date  . ABDOMINAL HYSTERECTOMY  2010   "laparoscopic"  . ANKLE FRACTURE SURGERY Right 1994  . AV FISTULA PLACEMENT Left 04/14/2016   Procedure: ARTERIOVENOUS (AV) FISTULA CREATION;  Surgeon: Chuck Hint, MD;  Location: [facility] OR;  Service: Vascular;  Laterality: Left;  . AV FISTULA PLACEMENT Left 08/09/2017   Procedure: INSERTION OF ARTERIOVENOUS (AV) GORE-TEX GRAFT LEFT ARM;  Surgeon: Sherren Kerns, MD;  Location: [facility] OR;  Service: Vascular;  Laterality: Left;  . AVGG REMOVAL Left 08/11/2017   Procedure: REMOVAL OF ARTERIOVENOUS GORETEX GRAFT (AVGG);  Surgeon: Nada Libman, MD;  Location: [facility] OR;  Service: Vascular;  Laterality: Left;  . BIOPSY  09/03/2017   Procedure: BIOPSY;  Surgeon: Kathi Der, MD;  Location: [facility] ENDOSCOPY;  Service: Gastroenterology;;  . BIOPSY  12/24/2017   Procedure: BIOPSY;  Surgeon: Lemar Lofty., MD;  Location: [facility] ENDOSCOPY;  Service: Gastroenterology;;  . CARDIAC CATHETERIZATION  2000's  . COLONOSCOPY WITH PROPOFOL N/A 09/04/2017   Procedure: COLONOSCOPY WITH PROPOFOL;  Surgeon: Kerin Salen, MD;  [facility] END[facility]COPY;  Service: Gastroenterology;  Laterality: N/A;  . ENTEROSCOPY N/A 12/24/2017   Procedure: ENTEROSCOPY;  Surgeon: Meridee Score Netty Starring., MD;  Location: [facility] ENDOSCOPY;  Service: Gastroenterology;  Laterality: N/A;  . ESOPHAGOGASTRODUODENOSCOPY    . ESOPHAGOGASTRODUODENOSCOPY (EGD) WITH PROPOFOL Left 06/04/2017   Procedure: ESOPHAGOGASTRODUODENOSCOPY (EGD) WITH PROPOFOL;  Surgeon: Willis Modena, MD;  Location: [facility] ENDOSCOPY;  Service: Endoscopy;  Laterality: Left;  . ESOPHAGOGASTRODUODENOSCOPY (EGD) WITH PROPOFOL N/A 09/03/2017   Procedure: ESOPHAGOGASTRODUODENOSCOPY (EGD) WITH PROPOFOL;  Surgeon: Kathi Der, MD;  Location: [facility] ENDOSCOPY;  Service: Gastroenterology;  Laterality: N/A;  . FISTULA SUPERFICIALIZATION Left 04/02/2017   Procedure: FISTULA SUPERFICIALIZATION LEFT ARM;  Surgeon: Nada Libman, MD;  Location: [facility] OR;  Service: Vascular;  Laterality: Left;  . FISTULOGRAM Left 04/07/2016   Procedure: FISTULOGRAM;  Surgeon: Maeola Harman, MD;  Location: [facility] OR;  Service: Vascular;  Laterality: Left;  . FRACTURE SURGERY     right ankle  . GIVENS CAPSULE STUDY N/A 10/29/2017   Procedure: GIVENS CAPSULE STUDY;  Surgeon: Charlott Rakes, MD;  Location: [facility] ENDOSCOPY;  Service: Endoscopy;  Laterality: N/A;  .  GIVENS CAPSULE STUDY N/A 12/24/2017   Procedure: GIVENS CAPSULE STUDY;  Surgeon: Lemar Lofty., MD;  Location: [facility] ENDOSCOPY;  Service: Gastroenterology;  Laterality: N/A;  . INSERTION OF DIALYSIS CATHETER Right 04/07/2016   Procedure: INSERTION OF DIALYSIS CATHETER;  Surgeon: Maeola Harman, MD;  Location: [facility] OR;  Service: Vascular;  Laterality: Right;  . INSERTION OF DIALYSIS CATHETER Right 04/04/2017   Procedure: INSERTION OF DIALYSIS CATHETER;  Surgeon: Maeola Harman, MD;  Location: [facility] OR;  Service: Vascular;  Laterality: Right;  . LIGATION OF ARTERIOVENOUS  FISTULA Left 04/14/2016   Procedure: LIGATION OF ARTERIOVENOUS  FISTULA;  Surgeon: Chuck Hint, MD;  Location: [facility] OR;  Service: Vascular;  Laterality: Left;  . PATCH ANGIOPLASTY Left 06/19/2016   Procedure: PATCH ANGIOPLASTY LEFT ARTERIOVENOUS FISTULA;  Surgeon: Chuck Hint, MD;  Location: [facility] OR;  Service: Vascular;  Laterality: Left;  . POLYPECTOMY  09/04/2017   Procedure: POLYPECTOMY;  Surgeon: Kerin Salen, MD;  Location: [facility] ENDOSCOPY;  Service: Gastroenterology;;  . REVISON OF ARTERIOVENOUS FISTULA Left 06/19/2016   Procedure: REVISION SUPERFICIALIZATION OF BRACHIOCEPHALIC ARTERIOVENOUS FISTULA;  Surgeon: Chuck Hint, MD;  Location: [facility] OR;  Service: Vascular;  Laterality: Left;     OB  History    Gravida  3   Para      Term      Preterm      AB      Living        SAB      TAB      Ectopic      Multiple  3   Live Births               Home Medications    Prior to Admission medications   Medication Sig Start Date End Date Taking? Authorizing Provider  allopurinol (ZYLOPRIM) 100 MG tablet Take 100 mg by mouth at bedtime.     [provider]  ALPRAZolam Duanne Moron) 1 MG tablet Take 1 mg by mouth 2 (two) times daily as needed for anxiety.  03/19/17   [provider]  atorvastatin (LIPITOR) 40 MG tablet Take 40 mg by mouth at bedtime.  03/14/14    [provider]  calcitRIOL (ROCALTROL) 0.5 MCG capsule Take 1 capsule (0.5 mcg total) by mouth daily with breakfast. Patient taking differently: Take 0.5 mcg by mouth at bedtime.  06/04/17   Bonnielee Haff, MD  cinacalcet (SENSIPAR) 30 MG tablet Take 1 tablet (30 mg total) by mouth daily. Patient taking differently: Take 30 mg by mouth at bedtime.  08/19/17   Shelly Coss, MD  diphenhydramine-acetaminophen (TYLENOL PM) 25-500 MG TABS tablet Take 2 tablets by mouth at bedtime as needed (sleep).     [provider]  esomeprazole (NEXIUM) 40 MG capsule Take 1 capsule (40 mg total) by mouth 2 (two) times daily. 02/17/18   Mansouraty, Telford Nab., MD  hydrOXYzine (ATARAX/VISTARIL) 25 MG tablet TK 1 T PO Q 6 H PRF ITCHING 01/25/18   [provider]  lisinopril (PRINIVIL,ZESTRIL) 10 MG tablet TK 1 T PO D 10/27/17   [provider]  metoprolol tartrate (LOPRESSOR) 25 MG tablet Take 25 mg by mouth at bedtime.  04/17/16   [provider]  multivitamin (RENA-VIT) TABS tablet Take 1 tablet by mouth at bedtime.     [provider]  omeprazole (PRILOSEC) 40 MG capsule Take 40 mg by mouth 2 (two) times daily.    [provider]  ondansetron (ZOFRAN) 8 MG tablet Take 8 mg by mouth every 8 (eight) hours as needed for nausea or vomiting.    [provider]  prochlorperazine (COMPAZINE) 10 MG tablet Take 1 tablet (10 mg total) by mouth every 8 (eight) hours as needed for nausea or vomiting. Patient not taking: Reported on 02/18/2018 02/17/18   Mansouraty, Telford Nab., MD  prochlorperazine (COMPAZINE) 25 MG suppository Place 1 suppository (25 mg total) rectally every 12 (twelve) hours as needed for nausea or vomiting. Patient not taking: Reported on 02/18/2018 02/17/18   Mansouraty, Telford Nab., MD  senna (SENOKOT) 8.6 MG TABS tablet Take 2 tablets (17.2 mg total) by mouth at bedtime. Patient not taking: Reported on 02/18/2018 04/15/16   Nita Sells, MD  sucroferric oxyhydroxide (VELPHORO) 500 MG chewable tablet Chew 1,000 mg by mouth 3 (three) times daily with meals.     [provider]  venlafaxine XR (EFFEXOR-XR) 75 MG 24 hr capsule Take 75 mg by mouth at bedtime.     [provider]    Family History Family History  Problem Relation Age of Onset  . Hypertension Mother   . Thyroid disease Mother   . Coronary artery disease Father   . Hypertension Father   . Diabetes  Father   . Colon cancer Maternal Aunt   . Esophageal cancer Maternal Uncle     Social History Social History   Tobacco Use  . Smoking status: Never Smoker  . Smokeless tobacco: Never Used  Substance Use Topics  . Alcohol use: No  . Drug use: No     Allergies   Nsaids and Tolmetin   Review of Systems Review of Systems  Constitutional: Positive for fatigue. Negative for chills and fever.  Respiratory: Positive for shortness of breath (on exertion, none at rest).   Cardiovascular: Negative for chest pain.  Gastrointestinal: Positive for abdominal pain (intermittent), blood in stool and nausea. Negative for anal bleeding, constipation, diarrhea and vomiting.  Genitourinary:       Does not produce urine  Musculoskeletal: Negative for arthralgias and myalgias.  Skin: Negative for color change.  Allergic/Immunologic: Positive for immunocompromised state (dialysis).  Neurological: Negative for weakness, light-headedness and numbness.  Hematological: Does not bruise/bleed easily.  Psychiatric/Behavioral: Negative for confusion.   All other systems reviewed and are negative for acute change except as noted in the HPI.    Physical Exam Updated Vital Signs BP 126/82 (BP Location: Left Arm)   Pulse 80   Temp 98.4 F (36.9 C) (Oral)   Resp 16   SpO2 99%   Physical Exam Vitals signs and nursing note reviewed.  Constitutional:      General: She is not in acute distress.    Appearance: Normal appearance. She is  well-developed. She is not toxic-appearing.     Comments: Afebrile, nontoxic, NAD  HENT:     Head: Normocephalic and atraumatic.  Eyes:     General:        Right eye: No discharge.        Left eye: No discharge.     Conjunctiva/sclera: Conjunctivae normal.  Neck:     Musculoskeletal: Normal range of motion and neck supple.  Cardiovascular:     Rate and Rhythm: Normal rate and regular rhythm.     Pulses: Normal pulses.     Heart sounds: Normal heart sounds, S1 normal and S2 normal. No murmur. No friction rub. No gallop.   Pulmonary:     Effort: Pulmonary effort is normal. No respiratory distress.     Breath sounds: Normal breath sounds. No decreased breath sounds, wheezing, rhonchi or rales.  Abdominal:     General: Bowel sounds are normal. There is no distension.     Palpations: Abdomen is soft. Abdomen is not rigid.     Tenderness: There is no abdominal tenderness. There is no right CVA tenderness, left CVA tenderness, guarding or rebound. Negative signs include Murphy's sign and McBurney's sign.     Comments: Soft, NTND, +BS throughout, no r/g/r, neg murphy's, neg mcburney's, no CVA TTP   Musculoskeletal: Normal range of motion.  Skin:    General: Skin is warm and dry.     Findings: No rash.  Neurological:     Mental Status: She is alert and oriented to person, place, and time.     Sensory: Sensation is intact. No sensory deficit.     Motor: Motor function is intact.  Psychiatric:        Mood and Affect: Mood and affect normal.        Behavior: Behavior normal.      ED Treatments / Results  Labs (all labs ordered are listed, but only abnormal results are displayed) Labs Reviewed  COMPREHENSIVE METABOLIC PANEL - Abnormal; Notable for the  following components:      Result Value   CO2 20 (*)    BUN 51 (*)    Creatinine, Ser 14.47 (*)    Calcium 8.1 (*)    Albumin 3.4 (*)    GFR calc non Af Amer 3 (*)    GFR calc Af Amer 3 (*)    Anion gap 21 (*)    All other  components within normal limits  CBC - Abnormal; Notable for the following components:   RBC 2.93 (*)    Hemoglobin 8.4 (*)    HCT 27.3 (*)    RDW 16.7 (*)    nRBC 0.8 (*)    All other components within normal limits  POC OCCULT BLOOD, ED  TYPE AND SCREEN    EKG None  Radiology Dg Abd 2 Views  Result Date: 02/18/2018 CLINICAL DATA:  Endo capsule swallowed 2 weeks ago, no abdominal complaints EXAM: ABDOMEN - 2 VIEW COMPARISON:  None FINDINGS: Peritoneal dialysis catheter present. No additional radiopaque foreign bodies identified. Small pelvic phleboliths seen. Osseous structures unremarkable. Bowel gas pattern normal. No definite urinary tract calcifications. IMPRESSION: Peritoneal dialysis catheter. No additional radiopaque foreign bodies identified. Electronically Signed   By: Lavonia Dana M.D.   On: 02/18/2018 09:00    Procedures Procedures (including critical care time)  Medications Ordered in ED Medications - No data to display   Initial Impression / Assessment and Plan / ED Course  I have reviewed the triage vital signs and the nursing notes.  Pertinent labs & imaging results that were available during my care of the patient were reviewed by me and considered in my medical decision making (see chart for details).     43 y.o. female here with concern for dropping hemoglobin, was seen at her GI specialist today for an endoscopy and her hemoglobin was 8.3, down from 8.8 yesterday, which is down from the 9 range previously.  Her upper endoscopy did not reveal any source of upper GI bleeding.  She was sent here for admission for monitoring of her hemoglobin.  She complains of having a melanotic stool this morning, generalized fatigue/weakness, dyspnea on exertion, and intermittent generalized domino cramping.  She always has some nausea as well.  On exam, no abdominal tenderness, vital signs stable, in no acute distress.  Will recheck labs and likely proceed with admission for  symptomatic anemia, may need transfusion.  Discussed with my attending Dr. Eulis Foster who agrees with plan  2:19 PM CBC confirms Hgb 8.4. CMP with stable kidney function and anion gap 21. Will proceed with admission.   2:50 PM Dr. Trilby Drummer of IM residency service returning page and will admit. Holding orders to be placed by admitting team. Please see their notes for further documentation of care. I appreciate their help with this pleasant pt's care. Pt stable at time of admission.    Final Clinical Impressions(s) / ED Diagnoses   Final diagnoses:  Symptomatic anemia  Melena    ED Discharge Orders    [address] [place], [place] 02/18/18 1450    Daleen Bo, MD 02/18/18 [phone]

## 2018-02-18 NOTE — H&P (Signed)
Date: 02/18/2018               Patient Name:  Patricia [name] MRN: [id number]  DOB: Jan 31, 1975 Age / Sex: 43 y.o., female   PCP: Elayne Guerin         Medical Service: Internal Medicine Teaching Service         Attending Physician: Dr. Rebeca Alert, Raynaldo Opitz, MD    First Contact: Dr. Gilberto Better Pager: [phone]  Second Contact: Dr. 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 She had multiple admissions in 2019 for acute blood loss anemia with melena with work-up significant for small bowel AVM and non-bleeding gastric ulcer in setting of chronic anemia of chronic disease due to end stage renal failure. She has been treated with Epogen, PPi therapy and recurrent blood transfusions. For this admission, she states she has been feeling more tired than usual for the last week with intermittent black watery stool with most recent bowel movement this am. She also has been feeling cramping abdominal pain and loss of appetite but denies any nausea, vomiting, BRBPR. She went to Dr.Masouraty's office this am for Upper endoscopy where a 3cm hiatal hernia was found but no obvious source of bleeding was seen. She was transferred to the ED for admission for observation to monitor for her hemoglobin. IM was consulted for admission.  Meds:  Current Facility-Administered Medications for the 02/18/18 encounter [facility] Encounter)  Medication  . 0.9 %  sodium chloride infusion   Current Meds  Medication Sig  . allopurinol (ZYLOPRIM) 100 MG tablet Take 100 mg by mouth at bedtime.   . ALPRAZolam (XANAX) 1 MG  tablet Take 1 mg by mouth 2 (two) times daily as needed for anxiety.   Marland Kitchen atorvastatin (LIPITOR) 40 MG tablet Take 40 mg by mouth at bedtime.   . calcitRIOL (ROCALTROL) 0.5 MCG capsule Take 1 capsule (0.5 mcg total) by mouth daily with breakfast. (Patient taking differently: Take 0.5 mcg by mouth at bedtime. )  . cinacalcet (SENSIPAR) 30 MG tablet Take 1 tablet (30 mg total) by mouth daily. (Patient taking differently: Take 30 mg by mouth at bedtime. )  . diphenhydramine-acetaminophen (TYLENOL PM) 25-500 MG TABS tablet Take 2 tablets by mouth at bedtime as needed (sleep).   Marland Kitchen esomeprazole (NEXIUM) 40 MG capsule Take 1 capsule (40 mg total) by mouth 2 (two) times daily.  . hydrOXYzine (ATARAX/VISTARIL) 25 MG tablet TK 1 T PO Q 6 H PRF ITCHING  . lisinopril (PRINIVIL,ZESTRIL) 10 MG tablet TK 1 T PO D  . metoprolol tartrate (LOPRESSOR) 25 MG tablet Take 25 mg by mouth at bedtime.   . multivitamin (RENA-VIT) TABS tablet Take 1 tablet by mouth at bedtime.   . senna (SENOKOT) 8.6 MG TABS tablet Take 2 tablets (17.2 mg total) by mouth at bedtime.  . sucroferric oxyhydroxide (VELPHORO) 500 MG chewable tablet Chew 1,000 mg by mouth 3 (three) times daily with meals.   . venlafaxine XR (EFFEXOR-XR) 75 MG 24 hr capsule Take 75 mg by mouth at bedtime.    Allergies: Allergies as of 02/18/2018 - Review  Complete 02/18/2018  Allergen Reaction Noted  . Nsaids Other (See Comments) 01/27/2013  . Tolmetin Other (See Comments) 07/20/2015   Past Medical History:  Diagnosis Date  . Acid reflux   . Anxiety   . Arthritis   . Dyspnea   . Dysrhythmia    tachycardia  . ESRD (end stage renal disease) ([place])    [place]- [place]  . GI bleeding 12/2017  . Gout   . Headache(784.0)    "related to chemo; sometimes weekly" (09/12/2013)  . High cholesterol   . History of blood transfusion "a couple"   "related to low counts"  . Hypertension   . Iron deficiency anemia    "get epogen shots q month" (02/20/2014)  . MCGN  (minimal change glomerulonephritis)    "using chemo to tx;  finished my last tx in 12/2013"  . Peritoneal dialysis status ([place])    Family History:  Strong family history of uterine and ovarian cancers in maternal family side but no 1st degree relatives with diagnosis. Strong family history of breast cancer in paternal family side but no 1st degree relatives with diagnosis. Denies any bleeding disorders.  Social History:  Lives at home with daughters. Currently unemployed. Spends most of her days cooking and cleaning but if symptomatic, lays in bed all day.  Review of Systems: A complete ROS was negative except as per HPI.  Physical Exam: Blood pressure 127/72, pulse 75, temperature 98.4 F (36.9 C), temperature source Oral, resp. rate (!) 21, SpO2 98 %. Physical Exam  Constitutional: She is oriented to person, place, and time and well-developed, well-nourished, and in no distress. No distress.  Appear fatigued, morbidly obese  Eyes:  Conjunctival pallor  Neck: Normal range of motion. Neck supple.  Cardiovascular: Normal rate, regular rhythm, normal heart sounds and intact distal pulses.  Pulmonary/Chest: Effort normal and breath sounds normal. She has no wheezes. She has no rales.  Abdominal: Soft. Bowel sounds are normal. There is abdominal tenderness (diffuse tenderness to palpation).  Peritoneal dialysis site covered with bandaging  Musculoskeletal: Normal range of motion.        General: No edema.  Neurological: She is alert and oriented to person, place, and time.  Skin: Skin is warm and dry. She is not diaphoretic.   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 She has a complicated history of recurrent melena and anemia with extensive work up. This appears to be a recurrent issue. 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 Appreciate Gi and Nephro recs. Admit to inpatient service for melena.  Melena, Symptomatic Anemia 2/2 bleeding small bowel AVM vs diverticulosis Upper endoscopy this AM negative for bleeding source. Significant GI bleed hx as noted above. Hgb Trend in outpatient setting 9.9->8.8->8.3. PT/INR wnl during last admission w/ acute bleed. Not on anticoagulation or antiplatelet therapy. - Appreciate GI recs: Octreotide, PPI, tagged RBC scan if continuing to bleed - Trend CBC - Type and screen - Transfuse if hgb<7  ESRD on peritoneal dialysis Performs nightly peritoneal dialysis at home. Last session last night. K 3.6. - Nephro consult for inpatient peritoneal  dialysis - Trend renal panel - C/w home meds: cinacalcet $RemoveBeforeDE'30mg'fCEoEWfyanWEOTl$  qhs, calcitriol 0.78mcg qhs, Rena-Vit daily qhs, hydroxyzine $RemoveBeforeDE'25mg'VfoTRCpyzCuWklv$  q6hr PRN for itching  Hx of Gout - c/w home meds: allopurinol $RemoveBeforeDEI'100mg'mCCUeIUhMgpyIGml$  qhs  Hx of Depression/Anxiety - c/w home meds: alprazolam $RemoveBeforeDE'1mg'cbLOyhYFDfoMqAG$  BID PRN, venlafaxine XR $RemoveBefore'75mg'dSQyDFTALACAv$  qhs  HLD - c/w home meds: Lipitor $RemoveBefor'40mg'CeWunceXNnnO$  po qhs  DVT prophx: SCDs Diet: Clear liquid Code: Full  Dispo: Admit patient to Inpatient with expected length of stay greater than 2 midnights.  Signed: Mosetta Anis, MD 02/18/2018, 3:25 PM  Pager: [phone]

## 2018-02-18 NOTE — Op Note (Signed)
[place] [name] Procedure Date: 02/18/2018 8:30 AM MRN: [id number] Endoscopist: Justice Britain , MD Age: 43 Referring MD:  Date of Birth: Jan 23, 1975 Gender: Female Account #: [id number] Procedure:                Enteroscopy Indications:              Iron deficiency anemia secondary to chronic blood                            loss, Dysphagia, Melena, Angioectasias of the Small                            bowel on VCE in 12/19 Medicines:                Monitored Anesthesia Care Procedure:                Pre-Anesthesia Assessment:                           - Prior to the procedure, a History and Physical                            was performed, and patient medications and                            allergies were reviewed. The patient's tolerance of                            previous anesthesia was also reviewed. The risks                            and benefits of the procedure and the sedation                            options and risks were discussed with the patient.                            All questions were answered, and informed consent                            was obtained. Prior Anticoagulants: The patient has                            taken no previous anticoagulant or antiplatelet                            agents. ASA Grade Assessment: III - A patient with                            severe systemic disease. After reviewing the risks                            and benefits, the patient was deemed in  satisfactory condition to undergo the procedure.                           After obtaining informed consent, the endoscope was                            passed under direct vision. Throughout the                            procedure, the patient's blood pressure, pulse, and                            oxygen saturations were monitored continuously. The                            Model PCF-H190DL [id number])  Pediatric Colonoscope                            was introduced through the mouth, and advanced to                            the proximal jejunum. The upper GI enteroscopy was                            accomplished without difficulty. The patient                            tolerated the procedure. Scope In: Scope Out: Findings:                 No gross lesions were noted in the proximal                            esophagus, in the mid esophagus and in the distal                            esophagus. Biopsies were taken with a cold forceps                            for histology from the proximal/middle esophagus.                            Biopsies were taken with a cold forceps for                            histology from the distal esophagus.                           A 3 cm hiatal hernia was found. The proximal extent                            of the gastric folds (end of tubular esophagus) was  37 cm from the incisors. The hiatal narrowing was                            40 cm from the incisors. The Z-line was 37 cm from                            the incisors. No evidence of Cameron's lesions.                           The gastroesophageal flap valve was visualized                            endoscopically and classified as Hill Grade IV (no                            fold, wide open lumen, hiatal hernia present).                           No gross lesions were noted in the entire examined                            stomach. Previous gastric ulcer has healed.                           No gross lesions were noted in the duodenal bulb,                            in the first portion of the duodenum, in the second                            portion of the duodenum, in the third portion of                            the duodenum and in the fourth portion of the                            duodenum.                           The ampulla was prominent.                            Normal mucosa was found in the proximal jejunum.                            Previous area of tattoo was felt to be seen. Area                            of distal most extent of today's enteroscopy was                            tattooed with an injection of Spot (carbon black). Complications:  No immediate complications. Estimated Blood Loss:     Estimated blood loss was minimal. Impression:               - No gross lesions in esophagus. Biopsied.                           - 3 cm hiatal hernia. Gastroesophageal flap valve                            classified as Hill Grade IV (no fold, wide open                            lumen, hiatal hernia present).                           - No gross lesions in the stomach. Previous ulcer                            has healed.                           - No gross lesions in the duodenal bulb, in the                            first portion of the duodenum, in the second                            portion of the duodenum, in the third portion of                            the duodenum and in the fourth portion of the                            duodenum. Prominent Ampulla.                           - Normal mucosa was found in the proximal jejunum.                            Tattooed distal most extent of enteroscopy today. Recommendation:           - The patient will be observed post-procedure,                            until all discharge criteria are met.                           - Discharge patient to home vs Admission for                            monitoring of Hgb and potential further procedures..                           - Patient has a contact number available for  emergencies. The signs and symptoms of potential                            delayed complications were discussed with the                            patient. Return to normal activities tomorrow.                             Written discharge instructions were provided to the                            patient.                           - Clear liquid diet.                           - Continue present medications.                           - Await pathology results.                           - A rectal exam was performed during procedure and                            showed evidence of dark/black stool material though                            not overt melena.                           - STAT CBC in recovery to see if blood counts have                            continued to drop from yesterday into today in                            setting of patient describing large dark, looser BM                            this AM.                           - Dependent on results of CBC will need to consider                            inpatient evaluation for recurrent anemia with dark                            stools/melena. May require transfer for Balloon                            Enteroscopy if things persist.                           -  At some point in future may consider the role of                            a duodenoscope view of the prominent ampulla.                           - The findings and recommendations were discussed                            with the patient.                           - The findings and recommendations were discussed                            with the patient's family. Justice Britain, MD 02/18/2018 9:13:16 AM

## 2018-02-18 NOTE — Progress Notes (Signed)
Daily Rounding Note  02/18/2018, 4:03 PM  LOS: 0 days   SUBJECTIVE:   Chief complaint: recurrent blood loss anemia.  Black stool     Hgb 8.3 >> 8.4 today.  9.9 five weeks ago. 10.9 three weeks ago.  9 nine days ago.  8.8 yesterday.   Black stools recurrent beginning last week. This a/w dizziness, fatigue, lack of vigor.   Receives Mircera injection every 2 weeks, got this yesterday.   No Bismuth or oral iron.     Chronic abd pain, nausea +/- non-bloody emesis. sxs persit despite max PPI.  Multiple studies for sxs dating back several years.       05/2017 EGD.  By Dr. Arta Silence for eval of abdominal pain, melena, anemia.  Noted medium hiatal hernia.  Patchy, moderate inflammation with congestion/edema, erosions, shallow ulcerations in the prepyloric region focal mild inflammation/edema and erythema in the duodenal bulb.  H Pylori serologies:  09/03/2017 EGD by Dr. Otis Brace.  For recurrent melena.  Nonobstructing Schatzki's ring in GE junction.  Small hiatal hernia.  Abnormal esophageal motility.  Mild gastritis in the prepylorus.  Scattered mild inflammation in the duodenal bulb.  Pathology showed benign gastric mucosa, no H. pylori. 09/04/2017 colonoscopy.  By Dr. Therisa Doyne.  Patchy edema/erythema in the sigmoid, transverse and descending colon.  Mucosal friability in the ascending colon with minimal fresh blood in the cecum and a sending colon.  No active bleeding during or after thorough lavage.  A few small mouth diverticula in the sigmoid and descending colon 2 sessile polyps, 3 to 8 mm in size, removed from the transverse colon.  Polyp pathology: sessile serrated, no high-grade dysplasia or malignancy; no pathology pertaining to the erythematous mucosa.  11/02/17 VCE(Capsule endoscopy).  Mild gastritis and duodenitis.  Nonspecific focal nodular area in the very distal small bowel.  "Obscure GI bleed, no source of anemia seen on  capsule endoscopy; anemia source likely multifactorial from ESRD and other factors. (recommend) Hematology outpatient consult for further evaluation of anemia" 12/24/17 Enteroscopy: For anemia, melena, obscure GI bleeding.  Dr. Rush Landmark.   Nonbleeding, clean based, gastric ulcer.  Nonbleeding erosive gastropathy.  Bulbar erythema.  Normal second, third, fourth duodenum.  Normal jejunum as examined.  Video capsule placed. 12/24/17 VCE: Incomplete study, capsule remained in the small bowel.  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  These are more than 2 hours beyond first duodenal image, too distal to approach with regular enteroscope.   12/19/2018 Enteroscopy.  Enteroscopy to prox jejunum 2/14: HH, previous gastric ulcer healed, no bleeding or lesions to explain bleeding. Prominent ampulla.  Distal esophagus normal but was biopsied.    OBJECTIVE:         Vital signs in last 24 hours:    Temp:  [98.4 F (36.9 C)] 98.4 F (36.9 C) (02/14 1229) Pulse Rate:  [68-85] 75 (02/14 1515) Resp:  [15-26] 21 (02/14 1515) BP: (111-179)/(60-106) 127/72 (02/14 1515) SpO2:  [93 %-100 %] 98 % (02/14 1515) Weight:  [120.2 kg] 120.2 kg (02/14 0759)   General: pleasant, obese AAF in NAD   Heart: RRR Chest: clear bil.  No cough or dyspnea Abdomen: soft, NT, ND active BS.  PD catheter site benign  Extremities: no CCE Neuro/Psych:  Oriented x 3.  Fully alert.  No deficits.    Intake/Output from previous day: No intake/output data recorded.  Intake/Output this shift: No intake/output data  recorded.  Lab Results: Recent Labs    02/17/18 1532 02/18/18 0926 02/18/18 1301  WBC 7.9 8.7 9.0  HGB 8.8 Repeated and verified X2.* 8.3 Repeated and verified X2.* 8.4*  HCT 26.7 Repeated and verified X2.* 25.0 Repeated and verified X2.* 27.3*  PLT 282.0 256.0 275   BMET Recent Labs    02/18/18 1301  NA 139  K 3.6  CL 98  CO2 20*  GLUCOSE 99  BUN 51*  CREATININE 14.47*    CALCIUM 8.1*   LFT Recent Labs    02/18/18 1301  PROT 6.9  ALBUMIN 3.4*  AST 33  ALT 27  ALKPHOS 42  BILITOT 0.5    Studies/Results: Dg Abd 2 Views  Result Date: 02/18/2018 CLINICAL DATA:  Endo capsule swallowed 2 weeks ago, no abdominal complaints EXAM: ABDOMEN - 2 VIEW COMPARISON:  None FINDINGS: Peritoneal dialysis catheter present. No additional radiopaque foreign bodies identified. Small pelvic phleboliths seen. Osseous structures unremarkable. Bowel gas pattern normal. No definite urinary tract calcifications. IMPRESSION: Peritoneal dialysis catheter. No additional radiopaque foreign bodies identified. Electronically Signed   By: Lavonia Dana M.D.   On: 02/18/2018 09:00   Scheduled Meds: . octreotide  10 mg Intramuscular Once  . sodium chloride flush  3 mL Intravenous Q12H  . sodium chloride flush  3 mL Intravenous Q12H   Continuous Infusions: . sodium chloride    . sodium chloride     PRN Meds:.sodium chloride, acetaminophen **OR** acetaminophen, ondansetron **OR** ondansetron (ZOFRAN) IV, senna-docusate, sodium chloride flush  ASSESMENT:   *  Recurrence of chronic intermittent, obscure SB GI bleeding.    Previous workups starting 05/2017.   Enteroscopy to prox jejunum 2/14: HH, previous gastric ulcer healed, no bleeding or lesions to explain bleeding. Prominent ampulla.  Distal esophagus normal but was biopsied.   Black stool on DRE post procedure.    *   Blood loss anemia, symptomatic.  Hgb drop 0.4 gm over last 2 days, drop of 1.5 gm over 5 weeks.   Biweekly Mircira at kidney center.    *   Resolved gastric ulcer.  Chronic heartburn, nausea, abdominal pain, despite Nexium 40 mg BID.  Multiple unrevealing CTs for eval,.   Dr Jerilynn Mages ordered GES for 2/27.    *   ESRD. On PD.      PLAN   *  Dr Rush Landmark sent pt to ED for observation admission in setting of declining Hgb and black stools.  At present pt does not need transfusion and is stable.   Case d/w Dr  Havery Moros, on-call GI doc: adding Octreotide SQ per his rec.     *   Balloon enteroscopy ideally but Dr Jerilynn Mages will not be able to perform this for many days (it is Friday and he has no time next week).  If balloon study needed sooner, consider arranging this at [facility].  If acute bleeding, drop in Hgb: perform RBC blood loss scan.    *   Ok to eat her normal renal diet  *   ? Use of SQ heparin with pt GI bleeding???  Going to stop this and add PAS hose.    *   Maintain  BID PPI.      Azucena Freed  02/18/2018, 4:03 PM Phone [phone]

## 2018-02-18 NOTE — Progress Notes (Signed)
Patient presented today for EGD/Enteroscopy in setting of having dark stools return and also workup of dysphagia. Her Hgb has dropped in 1.5 weeks from 9.6 -> 8.8 on check yesterday. This AM before procedure she described a dark, loose BM that occurred. 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 She will likely need a deep enteroscopy, which I am not sure I can provide within the next week or so. We repeated her Hgb and it is now down to 8.3. I am concerned that she has ongoing oozing and being a PD patient has a lower reserve. I recommend that she be observed in regards to Hgb over the next 24-36 hours, and if counts are dropping more significantly, then she will need further GI inpatient evaluation. Updated patient and daugther and they agree to come in for observation. We will discharge patient from [facility] and then send the patient via car to the [facility] ED since she is a PD patient and can only be taken care of there. I informed the ED Charge RN at [facility] about patient and they are aware and will help expedite things as much as possible. We will hold on EMS transfer. I have relayed this information to the Inpatient GI service.  Justice Britain, MD [facility] Office # [phone]

## 2018-02-18 NOTE — Consult Note (Addendum)
Patricia [name] Renal Consultation Note    Indication for Consultation:  Management of ESRD/hemodialysis; anemia, hypertension/volume and secondary hyperparathyroidism  Primary nephrologist: Deterding   HPI: Patricia [name] is a 43 y.o. 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 HD initiated 04/2016 then transitioned to CCPD in 05/2017.  Recurrent hospital admissions and significant GI workup since May 2019 with symptomatic anemia and melena. Most recent EGD/colonoscopy in August - mild gastritis/small diverticula/polyps. Capsule endoscopy in December with nonbleeding gastric ulcer.   Outpatient Hgb was 10.9 on 01/14/18 then down to 9.0 on 02/09/18. Had outpatient upper endoscopy today per Dr. Rush Landmark with no source of bleeding found.  Hgb found to be 8.3 and sent to ED for further evaluation of symptomatic anemia.   Seen in ED. Reports passing dark stools intermittently since Thursday and again this am. Also endorses increased weakness/fatigue and some nausea.   No PD issues recently. Denies fevers, chills, abd pain, cloudy effluent. Will continue CCPD during admission    Past Medical History:  Diagnosis Date  . Acid reflux   . Anxiety   . Arthritis   . Dyspnea   . Dysrhythmia    tachycardia  . ESRD (end stage renal disease) ([place])    [facility]- [place]  . GI bleeding 12/2017  . Gout   . Headache(784.0)    "related to chemo; sometimes weekly" (09/12/2013)  . High cholesterol   . History of blood transfusion "a couple"   "related to low counts"  . Hypertension   . Iron deficiency anemia    "get epogen shots q month" (02/20/2014)  . 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 Peritoneal dialysis status [facility])    Past Surgical History:  Procedure Laterality Date  . ABDOMINAL HYSTERECTOMY  2010   "laparoscopic"  . ANKLE FRACTURE SURGERY Right 1994  . AV FISTULA PLACEMENT Left 04/14/2016   Procedure: ARTERIOVENOUS (AV) FISTULA  CREATION;  Surgeon: Angelia Mould, MD;  Location: [facility] OR;  Service: Vascular;  Laterality: Left;  . AV FISTULA PLACEMENT Left 08/09/2017   Procedure: INSERTION OF ARTERIOVENOUS (AV) GORE-TEX GRAFT LEFT ARM;  Surgeon: Elam Dutch, MD;  Location: [place];  Service: Vascular;  Laterality: Left;  . [place] REMOVAL Left 08/11/2017   Procedure: REMOVAL OF ARTERIOVENOUS GORETEX GRAFT ([place]);  Surgeon: Serafina Mitchell, MD;  Location: [facility] OR;  Service: Vascular;  Laterality: Left;  . BIOPSY  09/03/2017   Procedure: BIOPSY;  Surgeon: Otis Brace, MD;  Location: [place];  Service: Gastroenterology;;  . BIOPSY  12/24/2017   Procedure: BIOPSY;  Surgeon: Irving Copas., MD;  Location: [place];  Service: Gastroenterology;;  . CARDIAC CATHETERIZATION  2000's  . COLONOSCOPY WITH PROPOFOL N/A 09/04/2017   Procedure: COLONOSCOPY WITH PROPOFOL;  Surgeon: Ronnette Juniper, MD;  Location: [place];  Service: Gastroenterology;  Laterality: N/A;  . ENTEROSCOPY N/A 12/24/2017   Procedure: ENTEROSCOPY;  Surgeon: Rush Landmark Telford Nab., MD;  Location: [place];  Service: Gastroenterology;  Laterality: N/A;  . ESOPHAGOGASTRODUODENOSCOPY    . ESOPHAGOGASTRODUODENOSCOPY (EGD) WITH PROPOFOL Left 06/04/2017   Procedure: ESOPHAGOGASTRODUODENOSCOPY (EGD) WITH PROPOFOL;  Surgeon: Arta Silence, MD;  Location: [place];  Service: Endoscopy;  Laterality: Left;  . ESOPHAGOGASTRODUODENOSCOPY (EGD) WITH PROPOFOL N/A 09/03/2017   Procedure: ESOPHAGOGASTRODUODENOSCOPY (EGD) WITH PROPOFOL;  Surgeon: Otis Brace, MD;  Location: [place];  Service: Gastroenterology;  Laterality: N/A;  . FISTULA SUPERFICIALIZATION Left 04/02/2017   Procedure: FISTULA SUPERFICIALIZATION LEFT ARM;  Surgeon:  Serafina Mitchell, MD;  Location: [facility] OR;  Service: Vascular;  Laterality: Left;  . FISTULOGRAM Left 04/07/2016   Procedure: FISTULOGRAM;  Surgeon: Waynetta Sandy, MD;  Location: [place];  Service: Vascular;   Laterality: Left;  . FRACTURE SURGERY     right ankle  . GIVENS CAPSULE STUDY N/A 10/29/2017   Procedure: GIVENS CAPSULE STUDY;  Surgeon: Wilford Corner, MD;  Location: [place];  Service: Endoscopy;  Laterality: N/A;  . GIVENS CAPSULE STUDY N/A 12/24/2017   Procedure: GIVENS CAPSULE STUDY;  Surgeon: Irving Copas., MD;  Location: [place];  Service: Gastroenterology;  Laterality: N/A;  . INSERTION OF DIALYSIS CATHETER Right 04/07/2016   Procedure: INSERTION OF DIALYSIS CATHETER;  Surgeon: Waynetta Sandy, MD;  Location: [place];  Service: Vascular;  Laterality: Right;  . INSERTION OF DIALYSIS CATHETER Right 04/04/2017   Procedure: INSERTION OF DIALYSIS CATHETER;  Surgeon: Waynetta Sandy, MD;  Location: [place];  Service: Vascular;  Laterality: Right;  . LIGATION OF ARTERIOVENOUS  FISTULA Left 04/14/2016   Procedure: LIGATION OF ARTERIOVENOUS  FISTULA;  Surgeon: Angelia Mould, MD;  Location: [place];  Service: Vascular;  Laterality: Left;  . PATCH ANGIOPLASTY Left 06/19/2016   Procedure: PATCH ANGIOPLASTY LEFT ARTERIOVENOUS FISTULA;  Surgeon: Angelia Mould, MD;  Location: [place];  Service: Vascular;  Laterality: Left;  . POLYPECTOMY  09/04/2017   Procedure: POLYPECTOMY;  Surgeon: Ronnette Juniper, MD;  [facility] ENDOSCOPY;  Service: Gastroenterology;;  . REVISON OF ARTERIOVENOUS FISTULA Left 06/19/2016   Procedure: REVISION SUPERFICIALIZATION OF BRACHIOCEPHALIC ARTERIOVENOUS FISTULA;  Surgeon: Angelia Mould, MD;  Location: [facility] OR;  Service: Vascular;  Laterality: Left;   Family History  Problem Relation Age of Onset  . Hypertension Mother   . Thyroid disease Mother   . Coronary artery disease Father   . Hypertension Father   . Diabetes Father   . Colon cancer Maternal Aunt   . Esophageal cancer Maternal Uncle   . Inflammatory bowel disease Neg Hx   . Liver disease Neg Hx   . Pancreatic cancer Neg Hx   . Rectal cancer Neg Hx   .  Stomach cancer Neg Hx    Social History:  reports that she has never smoked. She has never used smokeless tobacco. She reports that she does not drink alcohol or use drugs. Allergies  Allergen Reactions  . Nsaids Other (See Comments)    Cannot take due to Kidney disease/Kidney function   . Tolmetin Other (See Comments)    Cannot take due to Kidney Disease   Prior to Admission medications   Medication Sig Start Date End Date Taking? Authorizing Provider  allopurinol (ZYLOPRIM) 100 MG tablet Take 100 mg by mouth at bedtime.    Yes [provider]  ALPRAZolam Duanne Moron) 1 MG tablet Take 1 mg by mouth 2 (two) times daily as needed for anxiety.  03/19/17  Yes [provider]  atorvastatin (LIPITOR) 40 MG tablet Take 40 mg by mouth at bedtime.  03/14/14  Yes [provider]  calcitRIOL (ROCALTROL) 0.5 MCG capsule Take 1 capsule (0.5 mcg total) by mouth daily with breakfast. Patient taking differently: Take 0.5 mcg by mouth at bedtime.  06/04/17  Yes Bonnielee Haff, MD  cinacalcet (SENSIPAR) 30 MG tablet Take 1 tablet (30 mg total) by mouth daily. Patient taking differently: Take 30 mg by mouth at bedtime.  08/19/17  Yes Adhikari, Tamsen Meek, MD  diphenhydramine-acetaminophen (TYLENOL PM) 25-500 MG TABS tablet Take 2 tablets by  mouth at bedtime as needed (sleep).    Yes [provider]  esomeprazole (NEXIUM) 40 MG capsule Take 1 capsule (40 mg total) by mouth 2 (two) times daily. 02/17/18  Yes Mansouraty, Telford Nab., MD  hydrOXYzine (ATARAX/VISTARIL) 25 MG tablet Take 25 mg by mouth every 6 (six) hours as needed for itching.  01/25/18  Yes [provider]  lisinopril (PRINIVIL,ZESTRIL) 10 MG tablet Take 10 mg by mouth daily.  10/27/17  Yes [provider]  metoprolol tartrate (LOPRESSOR) 25 MG tablet Take 25 mg by mouth at bedtime.  04/17/16  Yes [provider]  multivitamin (RENA-VIT) TABS tablet Take 1 tablet by mouth at bedtime.    Yes [provider]  senna (SENOKOT) 8.6 MG TABS tablet Take 2 tablets (17.2 mg total) by mouth at bedtime. Patient taking differently: Take 2 tablets by mouth at bedtime as needed for mild constipation.  04/15/16  Yes Nita Sells, MD  sucroferric oxyhydroxide (VELPHORO) 500 MG chewable tablet Chew 1,000 mg by mouth 3 (three) times daily with meals.    Yes [provider]  venlafaxine XR (EFFEXOR-XR) 75 MG 24 hr capsule Take 75 mg by mouth at bedtime.    Yes [provider]  prochlorperazine (COMPAZINE) 10 MG tablet Take 1 tablet (10 mg total) by mouth every 8 (eight) hours as needed for nausea or vomiting. 02/17/18   Mansouraty, Telford Nab., MD   Current Facility-Administered Medications  Medication Dose Route Frequency Provider Last Rate Last Dose  . 0.9 %  sodium chloride infusion  500 mL Intravenous Once Mansouraty, Telford Nab., MD      . 0.9 %  sodium chloride infusion  250 mL Intravenous PRN [name], MD      . acetaminophen (TYLENOL) tablet 650 mg  650 mg Oral Q6H PRN [name], MD       Or  . acetaminophen (TYLENOL) suppository 650 mg  650 mg Rectal Q6H PRN [name], MD      . octreotide (SANDOSTATIN) injection 50 mcg  50 mcg Subcutaneous TID [name], MD      . ondansetron [facility]) tablet 4 mg  4 mg Oral Q6H PRN [name], MD       Or  . ondansetron [facility]) injection 4 mg  4 mg Intravenous Q6H PRN [name], MD      . senna-docusate (Senokot-S) tablet 1 tablet  1 tablet Oral QHS PRN [name], MD      . sodium chloride flush (NS) 0.9 % injection 3 mL  3 mL Intravenous Q12H [name], MD      . sodium chloride flush (NS) 0.9 % injection 3 mL  3 mL Intravenous PRN [name], MD      . sodium chloride flush (NS) 0.9 % injection 3 mL  3 mL Intravenous Q12H [name], MD       Current Outpatient Medications  Medication Sig Dispense Refill  . allopurinol (ZYLOPRIM) 100 MG tablet Take  100 mg by mouth at bedtime.     . ALPRAZolam (XANAX) 1 MG tablet Take 1 mg by mouth 2 (two) times daily as needed for anxiety.     Marland Kitchen atorvastatin (LIPITOR) 40 MG tablet Take 40 mg by mouth at bedtime.   3  . calcitRIOL (ROCALTROL) 0.5 MCG capsule Take 1 capsule (0.5 mcg total) by mouth daily with breakfast. (Patient taking differently: Take 0.5 mcg by mouth at bedtime. ) 30 capsule 0  . cinacalcet (SENSIPAR) 30 MG tablet  Take 1 tablet (30 mg total) by mouth daily. (Patient taking differently: Take 30 mg by mouth at bedtime. ) 30 tablet 0  . diphenhydramine-acetaminophen (TYLENOL PM) 25-500 MG TABS tablet Take 2 tablets by mouth at bedtime as needed (sleep).     Marland Kitchen esomeprazole (NEXIUM) 40 MG capsule Take 1 capsule (40 mg total) by mouth 2 (two) times daily. 60 capsule 2  . hydrOXYzine (ATARAX/VISTARIL) 25 MG tablet Take 25 mg by mouth every 6 (six) hours as needed for itching.     Marland Kitchen lisinopril (PRINIVIL,ZESTRIL) 10 MG tablet Take 10 mg by mouth daily.     . metoprolol tartrate (LOPRESSOR) 25 MG tablet Take 25 mg by mouth at bedtime.     . multivitamin (RENA-VIT) TABS tablet Take 1 tablet by mouth at bedtime.     . senna (SENOKOT) 8.6 MG TABS tablet Take 2 tablets (17.2 mg total) by mouth at bedtime. (Patient taking differently: Take 2 tablets by mouth at bedtime as needed for mild constipation. ) 120 each 0  . sucroferric oxyhydroxide (VELPHORO) 500 MG chewable tablet Chew 1,000 mg by mouth 3 (three) times daily with meals.     . venlafaxine XR (EFFEXOR-XR) 75 MG 24 hr capsule Take 75 mg by mouth at bedtime.     . prochlorperazine (COMPAZINE) 10 MG tablet Take 1 tablet (10 mg total) by mouth every 8 (eight) hours as needed for nausea or vomiting. 50 tablet 2     ROS: As per HPI otherwise negative.  Physical Exam: Vitals:   02/18/18 1345 02/18/18 1415 02/18/18 1445 02/18/18 1515  BP: 111/62 126/76 129/80 127/72  Pulse: 76 79 81 75  Resp: 15 (!) 26 (!) 23 (!) 21  Temp:      TempSrc:       SpO2: 98% 97% 96% 98%     General: Obese female NAD  Head: NCAT sclera not icteric MMM Neck: Supple. No JVD No masses Lungs: CTA bilaterally without wheezes, rales, or rhonchi. Breathing is unlabored. Heart: RRR with S1 S2 Abdomen: soft NT + BS Lower extremities:without edema or ischemic changes, no open wounds  Neuro: A & O  X 3. Moves all extremities spontaneously. Psych:  Responds to questions appropriately with a normal affect. Dialysis Access: PD cath in place   Labs: Basic Metabolic Panel: Recent Labs  Lab 02/18/18 1301  NA 139  K 3.6  CL 98  CO2 20*  GLUCOSE 99  BUN 51*  CREATININE 14.47*  CALCIUM 8.1*   Liver Function Tests: Recent Labs  Lab 02/18/18 1301  AST 33  ALT 27  ALKPHOS 42  BILITOT 0.5  PROT 6.9  ALBUMIN 3.4*   No results for input(s): LIPASE, AMYLASE in the last 168 hours. No results for input(s): AMMONIA in the last 168 hours. CBC: Recent Labs  Lab 02/17/18 1532 02/18/18 0926 02/18/18 1301  WBC 7.9 8.7 9.0  NEUTROABS  --  5.8  --   HGB 8.8 Repeated and verified X2.* 8.3 Repeated and verified X2.* 8.4*  HCT 26.7 Repeated and verified X2.* 25.0 Repeated and verified X2.* 27.3*  MCV 90.0 90.3 93.2  PLT 282.0 256.0 275   Cardiac Enzymes: No results for input(s): CKTOTAL, CKMB, CKMBINDEX, TROPONINI in the last 168 hours. CBG: No results for input(s): GLUCAP in the last 168 hours. Iron Studies:  Recent Labs    02/17/18 1532  IRON 149*  TRANSFERRIN 197.0*  FERRITIN 1,055.8*   Studies/Results: Dg Abd 2 Views  Result Date: 02/18/2018 CLINICAL DATA:  Endo capsule swallowed 2 weeks ago, no abdominal complaints EXAM: ABDOMEN - 2 VIEW COMPARISON:  None FINDINGS: Peritoneal dialysis catheter present. No additional radiopaque foreign bodies identified. Small pelvic phleboliths seen. Osseous structures unremarkable. Bowel gas pattern normal. No definite urinary tract calcifications. IMPRESSION: Peritoneal dialysis catheter. No additional  radiopaque foreign bodies identified. Electronically Signed   By: Lavonia Dana M.D.   On: 02/18/2018 09:00    Dialysis Orders:  CCPD 7x/week 5 exchanges Fill vol 3000 ml Dwell Time 2 hours. 1 day exchange fill volume 3000L Day pause 4 hours.   Outpatient Hgb: 10.9 (01/14/18), 9.0 (02/09/18)   Assessment/Plan: 1. Symptomatic anemia/melena --Followed by [facility]. Multiple past procedures have been unrevealing to determine source of bleeding. Had Upper endoscopy today - No gross lesions seen. GI following with concern that AVMs in small bowel may be oozing. Will follow Hgb trend. May need further intervention 2. ESRD -  Will continue CCPD. 2.5% sol  3. Hypertension/volume  - BP controlled. No gross volume on exam.  4. Anemia  -Hgb 8.4  Has been trending down. Mircera 225 dosed 02/15/18 at outpatient center. Follow trend. Tsat 54% Ferritin 8719.  5. Metabolic bone disease -  Continue Calcitriol Terri Skains. Velphoro on OP med list.  (Fe based binder which can cause dark stool) Tsat/ferritin high - need to consider alternative binder. Will discuss further with patient.  6. Nutrition - Renal diet. Prot supp for low albumin   Lynnda Child PA-C [place] Pager [phone] 02/18/2018, 3:54 PM   Pt seen, examined and agree w A/P as above.  [facility] 02/19/2018, 8:21 AM

## 2018-02-18 NOTE — Telephone Encounter (Signed)
Dx code given to pharmacy.

## 2018-02-18 NOTE — ED Triage Notes (Signed)
Pt presents for evaluation of low hemoglobin. Pt had endoscopy at [place] outpatient and sent for further eval. Pt reports feeling weak. Recently admitted for same and received blood transfusions.

## 2018-02-18 NOTE — ED Notes (Signed)
ED Provider at bedside. 

## 2018-02-18 NOTE — ED Notes (Signed)
Admitting physician at bedside

## 2018-02-18 NOTE — Telephone Encounter (Signed)
[facility] pharmacy called for diagnostic code for esomeprazole.

## 2018-02-18 NOTE — ED Notes (Signed)
Peripheral IV attempted x2. Blood drawn successfully, IV TEAM order placed.

## 2018-02-18 NOTE — ED Provider Notes (Signed)
  Face-to-face evaluation   History: She is here for evaluation of anemia with suspected GI bleeding.  She had endoscopy this morning without findings, for cause of bleeding.  Her GI doctor requested hospitalization for monitoring  Physical exam: Obese, alert and cooperative.  She is nontoxic in appearance.  Abdomen soft and nontender.  No dysarthria or aphasia.  Medical screening examination/treatment/procedure(s) were conducted as a shared visit with non-physician practitioner(s) and myself.  I personally evaluated the patient during the encounter    Daleen Bo, MD 02/18/18 [phone]

## 2018-02-18 NOTE — Progress Notes (Signed)
Called to room to assist during endoscopic procedure.  Patient ID and intended procedure confirmed with present staff. Received instructions for my participation in the procedure from the performing physician.  

## 2018-02-19 ENCOUNTER — Other Ambulatory Visit: Payer: Self-pay

## 2018-02-19 ENCOUNTER — Encounter (HOSPITAL_COMMUNITY): Payer: Self-pay | Admitting: *Deleted

## 2018-02-19 DIAGNOSIS — K921 Melena: Secondary | ICD-10-CM

## 2018-02-19 DIAGNOSIS — N186 End stage renal disease: Secondary | ICD-10-CM

## 2018-02-19 DIAGNOSIS — Z992 Dependence on renal dialysis: Secondary | ICD-10-CM

## 2018-02-19 LAB — RENAL FUNCTION PANEL
Albumin: 3 g/dL — ABNORMAL LOW (ref 3.5–5.0)
Anion gap: 18 — ABNORMAL HIGH (ref 5–15)
BUN: 52 mg/dL — AB (ref 6–20)
CO2: 21 mmol/L — ABNORMAL LOW (ref 22–32)
CREATININE: 14.59 mg/dL — AB (ref 0.44–1.00)
Calcium: 7.6 mg/dL — ABNORMAL LOW (ref 8.9–10.3)
Chloride: 99 mmol/L (ref 98–111)
GFR calc Af Amer: 3 mL/min — ABNORMAL LOW (ref >60)
GFR calc non Af Amer: 3 mL/min — ABNORMAL LOW (ref >60)
Glucose, Bld: 117 mg/dL — ABNORMAL HIGH (ref 70–99)
Phosphorus: 6.4 mg/dL — ABNORMAL HIGH (ref 2.5–4.6)
Potassium: 3 mmol/L — ABNORMAL LOW (ref 3.5–5.1)
Sodium: 138 mmol/L (ref 135–145)

## 2018-02-19 LAB — CBC
HCT: 24.2 % — ABNORMAL LOW (ref 36.0–46.0)
HCT: 25.2 % — ABNORMAL LOW (ref 36.0–46.0)
Hemoglobin: 7.8 g/dL — ABNORMAL LOW (ref 12.0–15.0)
Hemoglobin: 7.9 g/dL — ABNORMAL LOW (ref 12.0–15.0)
MCH: 29.2 pg (ref 26.0–34.0)
MCH: 28.4 pg (ref 26.0–34.0)
MCHC: 32.2 g/dL (ref 30.0–36.0)
MCHC: 31.3 g/dL (ref 30.0–36.0)
MCV: 90.6 fL (ref 80.0–100.0)
MCV: 90.6 fL (ref 80.0–100.0)
NRBC: 1.6 % — AB (ref 0.0–0.2)
Platelets: 256 10*3/uL (ref 150–400)
Platelets: 254 10*3/uL (ref 150–400)
RBC: 2.67 MIL/uL — ABNORMAL LOW (ref 3.87–5.11)
RBC: 2.78 MIL/uL — ABNORMAL LOW (ref 3.87–5.11)
RDW: 16.2 % — AB (ref 11.5–15.5)
RDW: 16.3 % — ABNORMAL HIGH (ref 11.5–15.5)
WBC: 7.6 10*3/uL (ref 4.0–10.5)
WBC: 6.8 10*3/uL (ref 4.0–10.5)
nRBC: 1 % — ABNORMAL HIGH (ref 0.0–0.2)

## 2018-02-19 MED ORDER — RAMELTEON 8 MG PO TABS
8.0000 mg | ORAL_TABLET | Freq: Every day | ORAL | Status: AC
  Administered 2018-02-19: 8 mg via ORAL
  Filled 2018-02-19: qty 1

## 2018-02-19 MED ORDER — PROMETHAZINE HCL 25 MG/ML IJ SOLN
12.5000 mg | Freq: Once | INTRAMUSCULAR | Status: AC | PRN
  Administered 2018-02-19: 12.5 mg via INTRAVENOUS
  Filled 2018-02-19: qty 1

## 2018-02-19 MED ORDER — LANTHANUM CARBONATE 500 MG PO CHEW
500.0000 mg | CHEWABLE_TABLET | Freq: Three times a day (TID) | ORAL | Status: DC
  Administered 2018-02-19 – 2018-02-20 (×2): 500 mg via ORAL
  Filled 2018-02-19 (×5): qty 1

## 2018-02-19 MED ORDER — ALPRAZOLAM 0.5 MG PO TABS
1.0000 mg | ORAL_TABLET | Freq: Two times a day (BID) | ORAL | Status: DC | PRN
  Administered 2018-02-21 – 2018-02-26 (×4): 1 mg via ORAL
  Filled 2018-02-19 (×4): qty 2

## 2018-02-19 NOTE — Progress Notes (Signed)
  Date: 02/19/2018  [name]  Medical record number: [id number]  Date of birth: 1976/01/01   I have seen and evaluated this patient and I have discussed the plan of care with the house staff. Please see their note for complete details. I concur with their findings with the following additions/corrections:   Please see my separate attestation of the H&P from 02/18/2018.  Lenice Pressman, M.D., Ph.D. 02/19/2018, 2:58 PM

## 2018-02-19 NOTE — Progress Notes (Signed)
   Subjective: Ms Michetti was seen resting in her bed on rounds this morning. She feels about the same today; continues to feel fatigued. No bowl movements overnight. Informed her her Hgb did drop some overnight and we would be working with GI on next steps.  Objective:  Vital signs in last 24 hours: Vitals:   02/18/18 2100 02/18/18 2208 02/19/18 0200 02/19/18 0913  BP: 132/74 132/82  122/85  Pulse: 91 81  73  Resp: 18 (!) 22  18  Temp: 98.3 F (36.8 C) 98.2 F (36.8 C)  98.3 F (36.8 C)  TempSrc: Oral Oral  Oral  SpO2: 100% 100%  99%  Weight: 121.7 kg     Height:   '5\' 7"'$  (1.702 m)    Physical Exam Constitutional:      General: She is not in acute distress.    Appearance: Normal appearance. She is obese.  Cardiovascular:     Rate and Rhythm: Normal rate and regular rhythm.     Pulses: Normal pulses.     Heart sounds: Normal heart sounds.  Pulmonary:     Effort: Pulmonary effort is normal. No respiratory distress.     Breath sounds: Normal breath sounds.  Abdominal:     General: Bowel sounds are normal. There is no distension.     Palpations: Abdomen is soft.     Tenderness: There is no abdominal tenderness.  Musculoskeletal:        General: No swelling or deformity.  Skin:    General: Skin is warm and dry.  Neurological:     General: No focal deficit present.     Mental Status: Mental status is at baseline.    Assessment/Plan:  Active Problems:   Anemia  43 yo F w/ PMH of gout, depression, anxiety, morbid obesity, ESRD on peritoneal dialysis 2/2 minimal change glomerulonephritis, PUD and small bowel AVM admit for melena and symptomatic anemia. She has a complicated history of recurrent melena and anemia with extensive work up. This appears to be a recurrent issue. 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  Melena, Symptomatic Anemia 2/2 bleeding small bowel AVM  vs diverticulosis Upper endoscopy this AM negative for bleeding source. Significant GI bleed hx as noted above. Hgb Trend in outpatient setting 9.9->8.8->8.3. Now down to 7.8 overnight. PT/INR wnl during last admission w/ acute bleed. Not on anticoagulation or antiplatelet therapy. - Appreciate GI recs - Continue Octreotide, PPI - Possible Tagged RBC scan  - Trend CBC - Transfuse if hgb<7  ESRD on peritoneal dialysis: Performs nightly peritoneal dialysis at home. - Appreciate Nephrology consult for inpatient peritoneal dialysis - Trend renal panel - C/w home meds: cinacalcet $RemoveBeforeDE'30mg'[id number]$  qhs, calcitriol 0.36mcg qhs, Rena-Vit daily qhs, hydroxyzine $RemoveBeforeDE'25mg'[id number]$  q6hr PRN for itching  DVT prophx: SCDs Diet: Clear liquid Code: Full  Dispo: Anticipated discharge in approximately 1-2 day(s).   [name], MD 02/19/2018, 11:59 AM

## 2018-02-19 NOTE — Progress Notes (Addendum)
[facility] Progress Note   Subjective:  Seen in room. Feels tired. Hgb drifting down. No BM yet today  On cycler  Objective Vitals:   02/18/18 2100 02/18/18 2208 02/19/18 0200 02/19/18 0913  BP: 132/74 132/82  122/85  Pulse: 91 81  73  Resp: 18 (!) 22  18  Temp: 98.3 F (36.8 C) 98.2 F (36.8 C)  98.3 F (36.8 C)  TempSrc: Oral Oral  Oral  SpO2: 100% 100%  99%  Weight: 121.7 kg     Height:   '5\' 7"'$  (1.702 m)     Physical Exam General: Obese female NAD  Heart: RRR Lungs: CTAB Abdomen: soft NTND Extremities: No LE edema  Dialysis Access: PD cath in use on cycler    Weight change:    Additional Objective Labs: Basic Metabolic Panel: Recent Labs  Lab 02/18/18 1301 02/18/18 1855 02/19/18 0605  NA 139  --  138  K 3.6  --  3.0*  CL 98  --  99  CO2 20*  --  21*  GLUCOSE 99  --  117*  BUN 51*  --  52*  CREATININE 14.47* 14.93* 14.59*  CALCIUM 8.1*  --  7.6*  PHOS  --   --  6.4*   CBC: Recent Labs  Lab 02/17/18 1532 02/18/18 0926 02/18/18 1301 02/19/18 0605  WBC 7.9 8.7 9.0 7.6  NEUTROABS  --  5.8  --   --   HGB 8.8 Repeated and verified X2.* 8.3 Repeated and verified X2.* 8.4* 7.8*  HCT 26.7 Repeated and verified X2.* 25.0 Repeated and verified X2.* 27.3* 24.2*  MCV 90.0 90.3 93.2 90.6  PLT 282.0 256.0 275 256   Blood Culture    Component Value Date/Time   SDES PERITONEAL DIALYSATE 06/03/2017 0240   SPECREQUEST NONE 06/03/2017 0240   CULT  06/03/2017 0240    NO GROWTH 3 DAYS Performed at [facility] [address], [place], [place] [address]    REPTSTATUS 06/06/2017 FINAL 06/03/2017 0240     Medications: . sodium chloride     . allopurinol  100 mg Oral QHS  . atorvastatin  40 mg Oral QHS  . calcitRIOL  0.5 mcg Oral QHS  . cinacalcet  30 mg Oral Q supper  . gentamicin cream  1 application Topical Daily  . metoprolol tartrate  25 mg Oral QHS  . multivitamin  1 tablet Oral QHS  . pantoprazole  40 mg Oral BID  .  sodium chloride flush  3 mL Intravenous Q12H  . sodium chloride flush  3 mL Intravenous Q12H  . venlafaxine XR  75 mg Oral QHS     Dialysis Orders:  CCPD 7x/week 5 exchanges Fill vol 3000 ml Dwell Time 2 hours. 1 day exchange fill volume 3000L Day pause 4 hours.   Outpatient Hgb: 10.9 (01/14/18), 9.0 (02/09/18)   Assessment/Plan: 1. Symptomatic anemia/melena --Followed by [facility]. Multiple past procedures have been unrevealing to determine source of bleeding. Had Upper endoscopy today - No gross lesions seen. GI following with concern that AVMs in small bowel may be oozing. Will follow Hgb trend. May need further intervention 2. ESRD -  Will continue CCPD. 2.5% sol  3. Hypertension/volume  - BP controlled. No gross volume on exam.  4. Anemia  -Hgb 8.4>7.8  Has been trending down. Mircera 225 dosed 02/15/18 at outpatient center. Follow trend. Tsat 54% Ferritin 1025.  5. Metabolic bone disease -  Corr Ca ok/Phos elevated Continue Calcitriol Terri Skains.  Velphoro on OP med list.  (Fe based binder which can cause dark stool) Tsat/ferritin high -Will start Fosrenol Nutrition - Liberalize diet. Prot supp for low albumin   Lynnda Child PA-C [facility] Pager [phone] 02/19/2018,10:33 AM  LOS: 1 day   Pt seen, examined and agree w A/P as above.  [facility] 02/19/2018, 12:50 PM

## 2018-02-19 NOTE — Progress Notes (Addendum)
    Progress Note   Subjective  Chief Complaint: Recurrent blood loss anemia, black stool  This morning, patient tells me she has had no further bowel movements overnight, she is asking about further testing and what the plans are going forward.  Also asks if she needs a blood transfusion yet.   Objective   Vital signs in last 24 hours: Temp:  [98 F (36.7 C)-98.4 F (36.9 C)] 98.3 F (36.8 C) (02/15 0913) Pulse Rate:  [73-91] 73 (02/15 0913) Resp:  [15-26] 18 (02/15 0913) BP: (110-138)/(60-93) 122/85 (02/15 0913) SpO2:  [95 %-100 %] 99 % (02/15 0913) Weight:  [121.7 kg] 121.7 kg (02/14 2100) Last BM Date: 02/18/18 General:  Obese AA female in NAD Heart:  Regular rate and rhythm; no murmurs Lungs: Respirations even and unlabored, lungs CTA bilaterally Abdomen:  Soft, nontender and nondistended. Normal bowel sounds. Extremities:  Without edema. Neurologic:  Alert and oriented,  grossly normal neurologically. Psych:  Cooperative. Normal mood and affect.  Intake/Output from previous day: 02/14 0701 - 02/15 0700 In: 480 [P.O.:480] Out: 0  Intake/Output this shift: Total I/O In: 240 [P.O.:240] Out: 0   Lab Results: Recent Labs    02/18/18 0926 02/18/18 1301 02/19/18 0605  WBC 8.7 9.0 7.6  HGB 8.3 Repeated and verified X2.* 8.4* 7.8*  HCT 25.0 Repeated and verified X2.* 27.3* 24.2*  PLT 256.0 275 256   BMET Recent Labs    02/18/18 1301 02/18/18 1855 02/19/18 0605  NA 139  --  138  K 3.6  --  3.0*  CL 98  --  99  CO2 20*  --  21*  GLUCOSE 99  --  117*  BUN 51*  --  52*  CREATININE 14.47* 14.93* 14.59*  CALCIUM 8.1*  --  7.6*   LFT Recent Labs    02/18/18 1301 02/19/18 0605  PROT 6.9  --   ALBUMIN 3.4* 3.0*  AST 33  --   ALT 27  --   ALKPHOS 42  --   BILITOT 0.5  --     Studies/Results: Dg Abd 2 Views  Result Date: 02/18/2018 CLINICAL DATA:  Endo capsule swallowed 2 weeks ago, no abdominal complaints EXAM: ABDOMEN - 2 VIEW COMPARISON:  None  FINDINGS: Peritoneal dialysis catheter present. No additional radiopaque foreign bodies identified. Small pelvic phleboliths seen. Osseous structures unremarkable. Bowel gas pattern normal. No definite urinary tract calcifications. IMPRESSION: Peritoneal dialysis catheter. No additional radiopaque foreign bodies identified. Electronically Signed   By: Lavonia Dana M.D.   On: 02/18/2018 09:00    Assessment / Plan:   Assessment: 1.  Recurrent, chronic, intermittent obscure small bowel GI bleeding 2.  Blood loss anemia, symptomatic: Hemoglobin 8.4--> 7.8 overnight, no further melena 3.  Resolved gastric ulcer 4.  ESRD: on PD  Plan: 1.  Continue observation of hemoglobin and transfusion as needed less than 7 2.  Continue Octreotide 3.  Please await any further recommendations from Dr. Havery Moros later today  Thank you for your kind consultation, we will continue to follow.   LOS: 1 day   Levin Erp  02/19/2018, 11:21 AM

## 2018-02-20 LAB — CBC
HCT: 25.9 % — ABNORMAL LOW (ref 36.0–46.0)
Hemoglobin: 8.2 g/dL — ABNORMAL LOW (ref 12.0–15.0)
MCH: 29 pg (ref 26.0–34.0)
MCHC: 31.7 g/dL (ref 30.0–36.0)
MCV: 91.5 fL (ref 80.0–100.0)
Platelets: 281 10*3/uL (ref 150–400)
RBC: 2.83 MIL/uL — ABNORMAL LOW (ref 3.87–5.11)
RDW: 16.4 % — ABNORMAL HIGH (ref 11.5–15.5)
WBC: 7.4 10*3/uL (ref 4.0–10.5)
nRBC: 0.8 % — ABNORMAL HIGH (ref 0.0–0.2)

## 2018-02-20 LAB — RENAL FUNCTION PANEL
Albumin: 3.2 g/dL — ABNORMAL LOW (ref 3.5–5.0)
Anion gap: 16 — ABNORMAL HIGH (ref 5–15)
BUN: 51 mg/dL — ABNORMAL HIGH (ref 6–20)
CO2: 24 mmol/L (ref 22–32)
Calcium: 8.1 mg/dL — ABNORMAL LOW (ref 8.9–10.3)
Chloride: 97 mmol/L — ABNORMAL LOW (ref 98–111)
Creatinine, Ser: 14.54 mg/dL — ABNORMAL HIGH (ref 0.44–1.00)
GFR calc Af Amer: 3 mL/min — ABNORMAL LOW (ref >60)
GFR calc non Af Amer: 3 mL/min — ABNORMAL LOW (ref >60)
Glucose, Bld: 124 mg/dL — ABNORMAL HIGH (ref 70–99)
Phosphorus: 7.6 mg/dL — ABNORMAL HIGH (ref 2.5–4.6)
Potassium: 2.9 mmol/L — ABNORMAL LOW (ref 3.5–5.1)
Sodium: 137 mmol/L (ref 135–145)

## 2018-02-20 LAB — HEPATITIS B SURFACE ANTIGEN: HEP B S AG: NEGATIVE

## 2018-02-20 MED ORDER — RAMELTEON 8 MG PO TABS
8.0000 mg | ORAL_TABLET | Freq: Every day | ORAL | Status: DC
  Administered 2018-02-20 – 2018-02-26 (×7): 8 mg via ORAL
  Filled 2018-02-20 (×8): qty 1

## 2018-02-20 MED ORDER — DELFLEX-LC/2.5% DEXTROSE 394 MOSM/L IP SOLN
INTRAPERITONEAL | Status: DC
  Administered 2018-02-20: 19:00:00 via INTRAPERITONEAL
  Administered 2018-02-21: 5000 mL via INTRAPERITONEAL

## 2018-02-20 MED ORDER — POTASSIUM CHLORIDE CRYS ER 20 MEQ PO TBCR
20.0000 meq | EXTENDED_RELEASE_TABLET | Freq: Two times a day (BID) | ORAL | Status: DC
  Administered 2018-02-20 – 2018-02-21 (×3): 20 meq via ORAL
  Filled 2018-02-20 (×3): qty 1

## 2018-02-20 MED ORDER — PROMETHAZINE HCL 25 MG/ML IJ SOLN
12.5000 mg | Freq: Four times a day (QID) | INTRAMUSCULAR | Status: DC | PRN
  Administered 2018-02-20 – 2018-02-26 (×12): 12.5 mg via INTRAVENOUS
  Filled 2018-02-20 (×13): qty 1

## 2018-02-20 MED ORDER — HEPARIN 1000 UNIT/ML FOR PERITONEAL DIALYSIS: 500.0000 [IU] | INTRAMUSCULAR | Status: DC | PRN

## 2018-02-20 NOTE — Progress Notes (Signed)
PD tx initiated via tenckhoff w/o problem VSS Report given to Janine Ores, RN

## 2018-02-20 NOTE — Progress Notes (Signed)
      Progress Note   Subjective  No bowel movements overnight, thinks she is doing better today in that regard. Hgb uptrended.   Objective   Vital signs in last 24 hours: Temp:  [98.3 F (36.8 C)-98.5 F (36.9 C)] 98.3 F (36.8 C) (02/16 0510) Pulse Rate:  [66-79] 68 (02/16 0510) Resp:  [18] 18 (02/16 0510) BP: (96-123)/(57-82) 113/68 (02/16 0510) SpO2:  [96 %-100 %] 96 % (02/16 0510) Weight:  [116.8 kg-121.1 kg] 116.8 kg (02/16 0940) Last BM Date: 02/19/18 General:    AA female in NAD Heart:  Regular rate and rhythm Lungs: Respirations even and unlabored, lungs CTA bilaterally Abdomen:  Soft, nontender and nondistended.  Extremities:  Without edema. Neurologic:  Alert and oriented,  grossly normal neurologically. Psych:  Cooperative. Normal mood and affect.  Intake/Output from previous day: 02/15 0701 - 02/16 0700 In: 1240 [P.O.:1240] Out: 0  Intake/Output this shift: Total I/O In: 15213 [P.O.:240; OACZY:60630] Out: 0   Lab Results: Recent Labs    02/19/18 0605 02/19/18 1835 02/20/18 0303  WBC 7.6 6.8 7.4  HGB 7.8* 7.9* 8.2*  HCT 24.2* 25.2* 25.9*  PLT 256 254 281   BMET Recent Labs    02/18/18 1301 02/18/18 1855 02/19/18 0605 02/20/18 0303  NA 139  --  138 137  K 3.6  --  3.0* 2.9*  CL 98  --  99 97*  CO2 20*  --  21* 24  GLUCOSE 99  --  117* 124*  BUN 51*  --  52* 51*  CREATININE 14.47* 14.93* 14.59* 14.54*  CALCIUM 8.1*  --  7.6* 8.1*   LFT Recent Labs    02/18/18 1301  02/20/18 0303  PROT 6.9  --   --   ALBUMIN 3.4*   < > 3.2*  AST 33  --   --   ALT 27  --   --   ALKPHOS 42  --   --   BILITOT 0.5  --   --    < > = values in this interval not displayed.   PT/INR No results for input(s): LABPROT, INR in the last 72 hours.  Studies/Results: No results found.     Assessment / Plan:    43 y/o female on peritoneal dialysis for CKD, with intermittent obscure overt GI bleeding. Extensive evaluation previously done including  colonoscopy, enteroscopy x 2, capsule endoscopy - overall thought to have mid small bowel bleeding due to AVMs, was not reached from push enteroscopy a few days ago.  I have given her a dose of octreotide depot to treat presumed AVM bleeding (low dose of $Remov'10mg'ItyzrI$  given renal disease). Hgb stable today without further bleeding overnight. At this point she warrants a balloon enteroscopy to treat small bowel AVMs, the question is timing. If she has stable Hgb for another 24 hours without bleeding she may be able to go home and do this as outpatient in the near future with Dr. Rush Landmark / Cirigliano. If she continues to bleed intermittently will need to be done while inpatient. If she has any significant rebleeding, which she has not had to date, would do tagged RBC scan to help with localization. I don't think a repeat capsule will change management at this point.   She agreed with the plan. Tolerating diet, will monitor today.  Kirkwood Cellar, MD [facility]

## 2018-02-20 NOTE — Progress Notes (Addendum)
[facility] Progress Note   Subjective:  Seen in room. No issues with PD overnight. Eating breakfast No BM this am.   Objective Vitals:   02/19/18 1534 02/19/18 1939 02/19/18 2104 02/20/18 0510  BP: (!) 96/57 123/68 117/82 113/68  Pulse: 66 67 79 68  Resp:  $Remo'18 18 18  'NaaEI$ Temp: 98.4 F (36.9 C) 98.5 F (36.9 C) 98.4 F (36.9 C) 98.3 F (36.8 C)  TempSrc: Oral Oral Oral Oral  SpO2: 100%  99% 96%  Weight:  120.5 kg 121.1 kg   Height:        Physical Exam General: Obese female NAD  Heart: RRR Lungs: CTAB Abdomen: soft NTND Extremities: No LE edema  Dialysis Access: PD cath in place    Weight change: -1.2 kg   Additional Objective Labs: Basic Metabolic Panel: Recent Labs  Lab 02/18/18 1301 02/18/18 1855 02/19/18 0605 02/20/18 0303  NA 139  --  138 137  K 3.6  --  3.0* 2.9*  CL 98  --  99 97*  CO2 20*  --  21* 24  GLUCOSE 99  --  117* 124*  BUN 51*  --  52* 51*  CREATININE 14.47* 14.93* 14.59* 14.54*  CALCIUM 8.1*  --  7.6* 8.1*  PHOS  --   --  6.4* 7.6*   CBC: Recent Labs  Lab 02/18/18 0926 02/18/18 1301 02/19/18 0605 02/19/18 1835 02/20/18 0303  WBC 8.7 9.0 7.6 6.8 7.4  NEUTROABS 5.8  --   --   --   --   HGB 8.3 Repeated and verified X2.* 8.4* 7.8* 7.9* 8.2*  HCT 25.0 Repeated and verified X2.* 27.3* 24.2* 25.2* 25.9*  MCV 90.3 93.2 90.6 90.6 91.5  PLT 256.0 275 256 254 281   Blood Culture    Component Value Date/Time   SDES PERITONEAL DIALYSATE 06/03/2017 0240   SPECREQUEST NONE 06/03/2017 0240   CULT  06/03/2017 0240    NO GROWTH 3 DAYS Performed at [facility] [address], [place], [place] [address]    REPTSTATUS 06/06/2017 FINAL 06/03/2017 0240     Medications: . sodium chloride     . allopurinol  100 mg Oral QHS  . atorvastatin  40 mg Oral QHS  . calcitRIOL  0.5 mcg Oral QHS  . cinacalcet  30 mg Oral Q supper  . gentamicin cream  1 application Topical Daily  . lanthanum  500 mg Oral TID WC  . metoprolol  tartrate  25 mg Oral QHS  . multivitamin  1 tablet Oral QHS  . pantoprazole  40 mg Oral BID  . potassium chloride  20 mEq Oral BID  . sodium chloride flush  3 mL Intravenous Q12H  . sodium chloride flush  3 mL Intravenous Q12H  . venlafaxine XR  75 mg Oral QHS     Dialysis Orders:  CCPD 7x/week 5 exchanges Fill vol 3000 ml Dwell Time 2 hours. 1 day exchange fill volume 3000L Day pause 4 hours.   Outpatient Hgb: 10.9 (01/14/18), 9.0 (02/09/18)   Assessment/Plan: 1. Symptomatic anemia/melena --Followed by [facility]. Multiple past procedures have been unrevealing to determine source of bleeding. Had Upper endoscopy 2/14  - No gross lesions seen. GI following with concern that AVMs in small bowel may be oozing. Will follow Hgb trend. May need further intervention --per GI  2. ESRD -  Will continue CCPD. 2.5% sol Has been dong 6 exchanges with no day pause in hosp. 3. Hypokalemia - Adding  supplements.  4. Hypertension/volume  - BP controlled. Home lisinopril on hold. No gross volume on exam. ?dry wt 5. Anemia  -Hgb 8.4>7.8>8.2   Mircera 225 dosed 02/15/18 at outpatient center. Follow trend. Tsat 54% Ferritin 1055.  6. Metabolic bone disease -  Corr Ca ok/Phos elevated Continue Calcitriol Terri Skains. Velphoro on OP med list.  (Fe based binder which can cause dark stool) Tsat/ferritin high -Will start Fosrenol here.  7. Nutrition - Carb modified diet per pt request. Prot supp for low albumin   Lynnda Child PA-C [place] Pager [phone] 02/20/2018,9:33 AM  LOS: 2 days   Pt seen, examined and agree w assess/plan as above with additions as indicated.  [facility] 02/20/2018, 12:33 PM

## 2018-02-20 NOTE — Progress Notes (Signed)
   Subjective:  Patricia [name] is a 43 y.o. 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 She was observed resting comfortably in bed. She states her jaw pain has resolved with Tylenol. Has had no bowel movement since yesterday. Currently does not have much appetite. Endorsing some nausea but no vomiting. Has some abdominal cramps but no Denies any chest pain, palpitations, numbness, tingling.  Objective:  Vital signs in last 24 hours: Vitals:   02/19/18 1534 02/19/18 1939 02/19/18 2104 02/20/18 0510  BP: (!) 96/57 123/68 117/82 113/68  Pulse: 66 67 79 68  Resp:  $Remo'18 18 18  'hwedv$ Temp: 98.4 F (36.9 C) 98.5 F (36.9 C) 98.4 F (36.9 C) 98.3 F (36.8 C)  TempSrc: Oral Oral Oral Oral  SpO2: 100%  99% 96%  Weight:  120.5 kg 121.1 kg   Height:       Gen: Well-developed, obese, NAD Neck: left sided mild tenderness to palpation along mandible, supple neck, ROM intact, no JVD,  CV: RRR, S1, S2 normal, No rubs, no murmurs, no gallops Pulm: CTAB, No rales, no wheezes, no dullness to percussion  Abd: Soft, BS+, mild diffuse tenderness to palpation, No rebound, no guarding Extm: ROM intact, Peripheral pulses intact, No peripheral edema Skin: Dry, Warm, normal turgor.   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 Hemoglobin stopped downtrending. Active bleeding may have stopped. Will defer to GI for further work-up for melena. Expect further  work up as outpatient.   Melena, Symptomatic Anemia 2/2 bleeding small bowel AVM Negatie upper endoscopy on admission. Hgb trend 8.4->7.8-->7.9->8.2 - Appreciate GI recs: Possible capsule endoscopy, Balloon enteroscopy in the future - C/w pantoprazole $RemoveBeforeDE'40mg'TIaFwUiaQYngFRj$  BID, Zofran $RemoveBef'4mg'CzmAZiCvfg$  q6hr PRN - Trend CBC - Transfuse if Hgb<7  ESRD on peritoneal dialysis:  Peritoneal dialysis managed by nephro. K 2.9 this am - Appreciate Nephro recs: C/w peritoneal dialysis, K-dur 20 mEq BID - Trend renal panel - C/w home meds: cinacalcet $RemoveBeforeDE'30mg'GsekIdkXDpKuSye$  qhs, calcitriol 0.33mcg qhs, Rena-Vit daily qhs, hydroxyzine $RemoveBeforeDE'25mg'ciixJqgHahffjiL$  q6hr PRN for itching  DVT prophx: SCDs Diet: Regular Code: Full  Dispo: Anticipated discharge in approximately 1-2 day(s).   Patricia Anis, MD 02/20/2018, 9:29 AM Pager: [phone]

## 2018-02-20 NOTE — Progress Notes (Signed)
1530 Report received, pt resting in bed, NAD, patient c/o decreased appetite and nausea. No other complaints at this time. WCTM.   1730 Patient refused meds, stated she wasn't going to eat dinner.

## 2018-02-20 NOTE — Progress Notes (Addendum)
  Date: 02/20/2018  [name]  Medical record number: [id number]  Date of birth: 03-Apr-1975   I have seen and evaluated this patient and I have discussed the plan of care with the house staff. Please see their note for complete details. I concur with their findings with the following additions/corrections:   43 year old woman admitted with melena and symptomatic anemia.  Unfortunately, despite multiple previous evaluations, unable to definitively identify source of bleeding.  GI suspects small bowel AVMs which have been noted previously.  Hemoglobin has stabilized here without significant additional bleeding.  Appreciate ongoing GI consultation, plan for monitoring through tomorrow.  If hemoglobin drops again or she has evidence of ongoing bleeding, will evaluate further with possible tagged RBC scan and try to obtain balloon enteroscopy to directly visualize the small bowel AVMs if possible.  If she does well with no further evidence of bleeding, GI would recommend pursuing this evaluation as an outpatient.  She continues to report dizziness and shortness of breath with standing and going to the bathroom.  She requires assistance to be steady on her feet.  I explained to her today why we would like to avoid further transfusion for her if possible.  She already has evidence of iron overload with ferritin over 1000 and transferrin saturation over 50%.  Appreciate nephrology consultation and management of her peritoneal dialysis.  She has hypokalemia to 2.9 today on her labs, which they are addressing.  Lenice Pressman, M.D., Ph.D. 02/20/2018, 2:21 PM

## 2018-02-20 NOTE — Progress Notes (Signed)
CRITICAL VALUE ALERT  Critical Value:  Potassium 2.9  Date & Time Notied: 1/16 0822   Provider Notified: Rebeca Alert  Orders Received/Actions taken: new orders will be placed

## 2018-02-21 ENCOUNTER — Inpatient Hospital Stay (HOSPITAL_COMMUNITY): Payer: Medicare Other

## 2018-02-21 ENCOUNTER — Telehealth: Payer: Self-pay

## 2018-02-21 DIAGNOSIS — R1032 Left lower quadrant pain: Secondary | ICD-10-CM

## 2018-02-21 DIAGNOSIS — K5520 Angiodysplasia of colon without hemorrhage: Secondary | ICD-10-CM

## 2018-02-21 DIAGNOSIS — D649 Anemia, unspecified: Secondary | ICD-10-CM

## 2018-02-21 LAB — CBC
HCT: 27.5 % — ABNORMAL LOW (ref 36.0–46.0)
Hemoglobin: 8.5 g/dL — ABNORMAL LOW (ref 12.0–15.0)
MCH: 28.1 pg (ref 26.0–34.0)
MCHC: 30.9 g/dL (ref 30.0–36.0)
MCV: 91.1 fL (ref 80.0–100.0)
Platelets: 286 10*3/uL (ref 150–400)
RBC: 3.02 MIL/uL — ABNORMAL LOW (ref 3.87–5.11)
RDW: 16.4 % — ABNORMAL HIGH (ref 11.5–15.5)
WBC: 9 10*3/uL (ref 4.0–10.5)
nRBC: 1.4 % — ABNORMAL HIGH (ref 0.0–0.2)

## 2018-02-21 LAB — RENAL FUNCTION PANEL
Albumin: 3.3 g/dL — ABNORMAL LOW (ref 3.5–5.0)
Albumin: 3.3 g/dL — ABNORMAL LOW (ref 3.5–5.0)
Anion gap: 20 — ABNORMAL HIGH (ref 5–15)
Anion gap: 19 — ABNORMAL HIGH (ref 5–15)
BUN: 52 mg/dL — ABNORMAL HIGH (ref 6–20)
BUN: 51 mg/dL — ABNORMAL HIGH (ref 6–20)
CO2: 22 mmol/L (ref 22–32)
CO2: 22 mmol/L (ref 22–32)
Calcium: 8 mg/dL — ABNORMAL LOW (ref 8.9–10.3)
Calcium: 8.1 mg/dL — ABNORMAL LOW (ref 8.9–10.3)
Chloride: 97 mmol/L — ABNORMAL LOW (ref 98–111)
Chloride: 94 mmol/L — ABNORMAL LOW (ref 98–111)
Creatinine, Ser: 14.8 mg/dL — ABNORMAL HIGH (ref 0.44–1.00)
Creatinine, Ser: 14.68 mg/dL — ABNORMAL HIGH (ref 0.44–1.00)
GFR calc Af Amer: 3 mL/min — ABNORMAL LOW (ref >60)
GFR calc non Af Amer: 3 mL/min — ABNORMAL LOW (ref >60)
GFR calc non Af Amer: 3 mL/min — ABNORMAL LOW (ref >60)
GFR, EST AFRICAN AMERICAN: 3 mL/min — AB (ref >60)
GLUCOSE: 135 mg/dL — AB (ref 70–99)
Glucose, Bld: 128 mg/dL — ABNORMAL HIGH (ref 70–99)
POTASSIUM: 3 mmol/L — AB (ref 3.5–5.1)
Phosphorus: 7.6 mg/dL — ABNORMAL HIGH (ref 2.5–4.6)
Phosphorus: 6.3 mg/dL — ABNORMAL HIGH (ref 2.5–4.6)
Potassium: 3.5 mmol/L (ref 3.5–5.1)
SODIUM: 135 mmol/L (ref 135–145)
Sodium: 139 mmol/L (ref 135–145)

## 2018-02-21 LAB — BODY FLUID CELL COUNT WITH DIFFERENTIAL
Eos, Fluid: 0 %
Lymphs, Fluid: 0 %
Monocyte-Macrophage-Serous Fluid: 12 % — ABNORMAL LOW (ref 50–90)
Neutrophil Count, Fluid: 88 % — ABNORMAL HIGH (ref 0–25)
Total Nucleated Cell Count, Fluid: 2100 cu mm — ABNORMAL HIGH (ref 0–1000)

## 2018-02-21 LAB — MAGNESIUM
MAGNESIUM: 1.3 mg/dL — AB (ref 1.7–2.4)
Magnesium: 1.5 mg/dL — ABNORMAL LOW (ref 1.7–2.4)

## 2018-02-21 MED ORDER — HYDROMORPHONE HCL 2 MG PO TABS
1.0000 mg | ORAL_TABLET | Freq: Three times a day (TID) | ORAL | Status: DC | PRN
  Administered 2018-02-21 – 2018-02-22 (×2): 1 mg via ORAL
  Filled 2018-02-21 (×2): qty 1

## 2018-02-21 MED ORDER — SODIUM CHLORIDE 0.9 % IV SOLN
1.0000 g | INTRAVENOUS | Status: DC
  Administered 2018-02-21 – 2018-02-27 (×7): 1 g via INTRAVENOUS
  Filled 2018-02-21 (×8): qty 1

## 2018-02-21 MED ORDER — POTASSIUM CHLORIDE CRYS ER 20 MEQ PO TBCR
40.0000 meq | EXTENDED_RELEASE_TABLET | Freq: Two times a day (BID) | ORAL | Status: AC
  Administered 2018-02-21 – 2018-02-23 (×4): 40 meq via ORAL
  Filled 2018-02-21 (×4): qty 2

## 2018-02-21 MED ORDER — VANCOMYCIN HCL 10 G IV SOLR
2000.0000 mg | Freq: Once | INTRAVENOUS | Status: AC
  Administered 2018-02-21: 2000 mg via INTRAVENOUS
  Filled 2018-02-21: qty 2000

## 2018-02-21 MED ORDER — POTASSIUM CHLORIDE CRYS ER 20 MEQ PO TBCR: 40.0000 meq | EXTENDED_RELEASE_TABLET | Freq: Two times a day (BID) | ORAL | Status: DC

## 2018-02-21 MED ORDER — SENNA 8.6 MG PO TABS
2.0000 | ORAL_TABLET | Freq: Every day | ORAL | Status: DC | PRN
  Administered 2018-02-21: 17.2 mg via ORAL
  Filled 2018-02-21: qty 2

## 2018-02-21 MED ORDER — MAGNESIUM OXIDE 400 (241.3 MG) MG PO TABS
400.0000 mg | ORAL_TABLET | Freq: Two times a day (BID) | ORAL | Status: AC
  Administered 2018-02-21 – 2018-02-22 (×4): 400 mg via ORAL
  Filled 2018-02-21 (×4): qty 1

## 2018-02-21 MED ORDER — HEPARIN 1000 UNIT/ML FOR PERITONEAL DIALYSIS: 500.0000 [IU] | INTRAMUSCULAR | Status: DC | PRN

## 2018-02-21 MED ORDER — VANCOMYCIN VARIABLE DOSE PER UNSTABLE RENAL FUNCTION (PHARMACIST DOSING): Status: DC

## 2018-02-21 MED ORDER — LANTHANUM CARBONATE 500 MG PO CHEW
1000.0000 mg | CHEWABLE_TABLET | Freq: Three times a day (TID) | ORAL | Status: DC
  Administered 2018-02-21 – 2018-02-27 (×15): 1000 mg via ORAL
  Filled 2018-02-21 (×15): qty 2

## 2018-02-21 MED ORDER — HYDROMORPHONE HCL 2 MG PO TABS
1.0000 mg | ORAL_TABLET | ORAL | Status: DC | PRN
  Administered 2018-02-21: 1 mg via ORAL
  Filled 2018-02-21: qty 1

## 2018-02-21 MED ORDER — HYDROMORPHONE HCL 2 MG PO TABS
2.0000 mg | ORAL_TABLET | Freq: Four times a day (QID) | ORAL | Status: DC | PRN
  Administered 2018-02-21: 2 mg via ORAL
  Filled 2018-02-21: qty 1

## 2018-02-21 NOTE — Discharge Instructions (Signed)
Dear Patricia [name]  You came to [place] with bloody stool. We have determined this was caused by your small intestines. Here are our recommendations for you at discharge:  - Please continue taking your Protonix as prescribed. - Please make sure to follow up with your Gastroenterologists  Thank you for choosing [facility]   Gastrointestinal Bleeding  Gastrointestinal bleeding is bleeding somewhere along the path food travels through the body (digestive tract). This path is anywhere between the mouth and the opening of the butt (anus). You may have blood in your poop (stools) or have black poop. If you throw up (vomit), there may be blood in it. This condition can be mild, serious, or even life-threatening. If you have a lot of bleeding, you may need to stay in the hospital. Follow these instructions at home:  Take over-the-counter and prescription medicines only as told by your doctor.  Eat foods that have a lot of fiber in them. These foods include whole grains, fruits, and vegetables. You can also try eating 1-3 prunes each day.  Drink enough fluid to keep your pee (urine) clear or pale yellow.  Keep all follow-up visits as told by your doctor. This is important. Contact a doctor if:  Your symptoms do not get better. Get help right away if:  Your bleeding gets worse.  You feel dizzy or you pass out (faint).  You feel weak.  You have very bad cramps in your back or belly (abdomen).  You pass large clumps of blood (clots) in your poop.  Your symptoms are getting worse. This information is not intended to replace advice given to you by your health care provider. Make sure you discuss any questions you have with your health care provider. Document Released: 10/01/2007 Document Revised: 05/30/2015 Document Reviewed: 06/11/2014 Elsevier Interactive Patient Education  2019 [employer].

## 2018-02-21 NOTE — Progress Notes (Signed)
  Date: 02/21/2018  [name]  Medical record number: [id number]  Date of birth: 1975-10-15   I have seen and evaluated this patient and I have discussed the plan of care with the house staff. Please see their note for complete details. I concur with their findings with the following additions/corrections:   Very uncomfortable with abdominal pain this morning. Abdomen is soft, diffusely tender, but without rebound tenderness. Most likely related to constipation, but bigger concern would be for peritonitis given her PD, or less likely, perforation related to her bleeding or endoscopy. Appreciate nephrology ordering PD fluid analysis and antibiotics. CT shows small amount of free air, difficult to interpret in context of PD. Abdominal fluid should help [place] differentiate. Continue antibiotics for now, will defer to nephrology if we should use instilled peritoneal antibiotics as well. Otherwise, stable from a GI bleed perspective. GI planning balloon enteroscopy 2/27.  Lenice Pressman, M.D., Ph.D. 02/21/2018, 4:00 PM

## 2018-02-21 NOTE — Progress Notes (Signed)
Pharmacy Antibiotic Note  Patricia [name] is a 43 y.o. female to begin Vancomycin and Ceftazidime for peritonitis coverage.  ESRD, on CCPD.  Peritoneal fluid culture to be sent.  Plan:  Ceftazidime 1gm IV q24hrs.  Vancomycin 2gm IV x 1  Will check random Vanc level is 3-4 days and plan to re-dose when level <20 mcg/ml  Height: $Remove'5\' 7"'RNrrZah$  (170.2 cm) Weight: 272 lb 7.8 oz (123.6 kg) IBW/kg (Calculated) : 61.6  Temp (24hrs), Avg:98.7 F (37.1 C), Min:98 F (36.7 C), Max:99.3 F (37.4 C)  Recent Labs  Lab 02/18/18 1301 02/18/18 1855 02/19/18 0605 02/19/18 1835 02/20/18 0303 02/21/18 0555  WBC 9.0  --  7.6 6.8 7.4 9.0  CREATININE 14.47* 14.93* 14.59*  --  14.54* 14.80*      Allergies  Allergen Reactions  . Nsaids Other (See Comments)    Cannot take due to Kidney disease/Kidney function   . Tolmetin Other (See Comments)    Cannot take due to Kidney Disease    Antimicrobials this admission:  Ceftazidime 2/17>>  Vancomycin 2/17>>  Microbiology results:  2/17 PD fluid -  Thank you for allowing pharmacy to be a part of this patient's care.  Arty Baumgartner, [place] Pager: [phone] or phone: [phone] 02/21/2018 10:58 AM

## 2018-02-21 NOTE — Telephone Encounter (Signed)
  Follow up Call-  Call back number 02/18/2018  Post procedure Call Back phone  # [phone]  Permission to leave phone message Yes  Some recent data might be hidden     Patient questions:  Do you have a fever, pain , or abdominal swelling? No. Pain Score  0 *  Have you tolerated food without any problems? Yes.    Have you been able to return to your normal activities? No.  Hospitalized.  Blood count dropped.  No transfusions.  Do you have any questions about your discharge instructions: Diet   No. Medications  No. Follow up visit  No.  Do you have questions or concerns about your Care? No.  Actions: * If pain score is 4 or above: No action needed, pain <4.  HOSPITALIZED

## 2018-02-21 NOTE — Progress Notes (Signed)
   Subjective:  Kao Berkheimer is a 43 y.o. 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 She was observed currently undergoing peritoneal dialysis. Currently in significant distress with crampy abdominal pain. She states she has been having acute onset abdominal pain starting this morning without obvious etiology. She denies having any more melena. States she has not had any more bowel movement since hospitalization. Currently endorsing nausea and loss of appetite without vomiting. Denies any fevers or chills or chest pain.  Objective:  Vital signs in last 24 hours: Vitals:   02/21/18 0451 02/21/18 0623 02/21/18 0943 02/21/18 1215  BP: (!) 120/44 (!) 93/59 109/67 101/62  Pulse: 72 65 79 87  Resp: $Remo'19 20 20   'XZJzG$ Temp: 98 F (36.7 C) 98.8 F (37.1 C) 99.3 F (37.4 C) 99.1 F (37.3 C)  TempSrc: Oral Oral Oral Oral  SpO2: 100% 100% 99% 99%  Weight:    115.9 kg  Height:       Gen: Well-developed, obese, NAD Neck: left sided mild tenderness to palpation along mandible, supple neck, ROM intact, no JVD,  CV: RRR, S1, S2 normal, No rubs, no murmurs, no gallops Pulm: CTAB, No rales, no wheezes, no dullness to percussion  Abd: Soft, hypoactive bowel sounds, tenderness to palpation on LLQ, no guarding, currently undergoing peritoneal dialysis. Extm: ROM intact, Peripheral pulses intact, No peripheral edema Skin: Dry, Warm, normal turgor.   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 Her hemoglobin is stable and her bleeding appears to have stopped for now but currently having significant acute onset abdominal pain concerning for peritonitis. Also have had refractory hypokalemia with K at 3.0 leading to muscle cramps. Will f/u with additional labs and imaging  Acute onset abdominal pain 2/2 peritonitis? Afebrile, no leukocytosis, currently undergoing peritoneal dialysis. Unclear etiology of abdominal pain. Nephro suspecting peritonitis. KUB show free air expected w/ peritoneal dialysis - Appreciate nephro recs: body fluid cell count and culture ordered, start ceftazidime, vanc - Trend CBC - F/u above labs - F/u CT abd/pelvis  Melena, Symptomatic Anemia 2/2 bleeding small bowel AVM Negative upper endoscopy on admission. Hgb trend 8.4->7.8-->7.9->8.2 - Appreciate GI recs: Possible capsule endoscopy, Balloon enteroscopy in the future - C/w pantoprazole $RemoveBeforeDE'40mg'ltAUSvRQgRJAmpp$  BID, Zofran $RemoveBef'4mg'ahwfuZmmmI$  q6hr PRN - Trend CBC - Transfuse if Hgb<7  ESRD on peritoneal dialysis:  Peritoneal dialysis managed by nephro. K 2.9 this am - Appreciate Nephro recs: C/w peritoneal dialysis, K-dur 20 mEq BID - Trend renal panel - C/w home meds: cinacalcet $RemoveBeforeDE'30mg'fQEFMltEZpPDlNL$  qhs, calcitriol 0.24mcg qhs, Rena-Vit daily qhs, hydroxyzine $RemoveBeforeDE'25mg'esWbREgrcCsprVb$  q6hr PRN for itching  DVT prophx: SCDs Diet: Regular Code: Full  Dispo: Anticipated discharge in approximately 1-2 day(s).   Mosetta Anis, MD 02/21/2018, 3:21 PM Pager: [phone]

## 2018-02-21 NOTE — Progress Notes (Addendum)
[facility] Progress Note   Subjective:  Seen in room, looks uncomfortable. C/o L abdominal pains since 4am this morning. No CP/dyspnea. No N/V. No BM in 3 days per her report. Finishing up with [facility] - the outflow fluid looks cloudy (see plan below).   Objective Vitals:   02/20/18 2128 02/21/18 0451 02/21/18 0623 02/21/18 0943  BP: 128/87 (!) 120/44 (!) 93/59 109/67  Pulse: 64 72 65 79  Resp: $Remo'19 19 20 20  'MgALR$ Temp: 99 F (37.2 C) 98 F (36.7 C) 98.8 F (37.1 C) 99.3 F (37.4 C)  TempSrc: Oral Oral Oral Oral  SpO2: 98% 100% 100% 99%  Weight: 123.6 kg     Height:       Physical Exam General: Well appearing woman, in mild pain. Heart: RRR; no murmur Lungs: CTAB Abdomen: soft, tenderness to palpation in LLQ (around PD cath). Cath exit site without drainage, no erythema Extremities: No LE edema  Additional Objective Labs: Basic Metabolic Panel: Recent Labs  Lab 02/19/18 0605 02/20/18 0303 02/21/18 0555  NA 138 137 139  K 3.0* 2.9* 3.0*  CL 99 97* 97*  CO2 21* 24 22  GLUCOSE 117* 124* 135*  BUN 52* 51* 52*  CREATININE 14.59* 14.54* 14.80*  CALCIUM 7.6* 8.1* 8.0*  PHOS 6.4* 7.6* 7.6*   Liver Function Tests: Recent Labs  Lab 02/18/18 1301 02/19/18 0605 02/20/18 0303 02/21/18 0555  AST 33  --   --   --   ALT 27  --   --   --   ALKPHOS 42  --   --   --   BILITOT 0.5  --   --   --   PROT 6.9  --   --   --   ALBUMIN 3.4* 3.0* 3.2* 3.3*   CBC: Recent Labs  Lab 02/18/18 0926 02/18/18 1301 02/19/18 0605 02/19/18 1835 02/20/18 0303 02/21/18 0555  WBC 8.7 9.0 7.6 6.8 7.4 9.0  NEUTROABS 5.8  --   --   --   --   --   HGB 8.3 Repeated and verified X2.* 8.4* 7.8* 7.9* 8.2* 8.5*  HCT 25.0 Repeated and verified X2.* 27.3* 24.2* 25.2* 25.9* 27.5*  MCV 90.3 93.2 90.6 90.6 91.5 91.1  PLT 256.0 275 256 254 281 286   Studies/Results: Dg Abd Decub  Result Date: 02/21/2018 CLINICAL DATA:  Acute left-sided abdominal pain, nausea. EXAM: ABDOMEN - 1 VIEW  DECUBITUS COMPARISON:  Radiographs of same day. FINDINGS: The bowel gas pattern is normal. No radio-opaque calculi are seen. Free air is seen along the right side of the abdomen which most likely is related to the history of peritoneal dialysis. IMPRESSION: Pneumoperitoneum is noted along the right side of the lateral abdominal wall on this decubitus projection that most likely is related to peritoneal dialysis. No evidence of bowel obstruction or ileus. Electronically Signed   By: Marijo Conception, M.D.   On: 02/21/2018 11:56   Dg Abd Portable 1v  Result Date: 02/21/2018 CLINICAL DATA:  Upper abdominal pain. EXAM: PORTABLE ABDOMEN - 1 VIEW COMPARISON:  CT scan of December 23, 2017. FINDINGS: The bowel gas pattern is normal. Phleboliths are noted in the pelvis. Distal tip of peritoneal dialysis catheter is seen in the right side of the pelvis. IMPRESSION: No evidence of bowel obstruction or ileus. Distal tip of peritoneal dialysis catheter seen in right-sided pelvis. Electronically Signed   By: Marijo Conception, M.D.   On: 02/21/2018 10:34   Medications: . sodium  chloride    . cefTAZidime (FORTAZ)  IV    . dialysis solution 2.5% low-MG/low-CA    . vancomycin     . allopurinol  100 mg Oral QHS  . atorvastatin  40 mg Oral QHS  . calcitRIOL  0.5 mcg Oral QHS  . cinacalcet  30 mg Oral Q supper  . gentamicin cream  1 application Topical Daily  . lanthanum  500 mg Oral TID WC  . metoprolol tartrate  25 mg Oral QHS  . multivitamin  1 tablet Oral QHS  . pantoprazole  40 mg Oral BID  . potassium chloride  20 mEq Oral BID  . ramelteon  8 mg Oral QHS  . sodium chloride flush  3 mL Intravenous Q12H  . sodium chloride flush  3 mL Intravenous Q12H  . vancomycin variable dose per unstable renal function (pharmacist dosing)   Does not apply See admin instructions  . venlafaxine XR  75 mg Oral QHS   Dialysis Orders: [facility]CP[facility] 7x/week 5 exchanges Fill vol 3000 ml Dwell Time 2 hours. 1 day exchange fill volume  3000L, Day pause 4 hours. - Outpatient Hgb: 10.9 (01/14/18), 9.0 (02/09/18)  Assessment/Plan: 1. Symptomatic anemia/melena: Followed by [facility]. Multiplepast procedures have been unrevealing.S/p EGD 2/14 no bleeding lesions seen.GI following with concern that AVMs in small bowel may be oozing. May need further intervention per GI. 2. Probable PD cath-related peritonitis: New cloudy PD effluent and L abd pain starting 2/17 morning. Will check cell count, gram stain, Cx and do 2 rapid in/out exchanges. Pharm consult placed for Vanc/Ceftaz. Last GI procedure 2/15 - will get lateral decub xray to make sure no free air in abdomen. Getting CT abd to r/o any acute abd condition. It's unusual to get peritonitis in the hospital.  3. ESRD: Continue [facility] while here. 6 exchanges, no day dwell while inpatient.  4. Hypokalemia/hypomagnesemia: K 3, cautiously ^ KCl to 21mEq BID for 2d. Mg low as well, will give MgOx $RemoveB'400mg'eQOwZrhm$  BID x 2d. 5. Hypertension/volume: BP controlled, home lisinopril on hold. No edema.  6. Anemia: Hgb stable/low. Last Mircera 225 dosed 02/15/18, tsat 54%. transfuse prn. 7. Metabolic bone disease: Corr Ca ok/Phos high. Continue Calcitriol/Sensipar. Takes Velphoro as outpatient binder which is Fe based & can cause dark stools, changed to fosrenol here.  8. Nutrition: Alb low, continue protein supplements.  Veneta Penton, PA-C 02/21/2018, 12:01 PM  [facility] Pager: [phone]  Pt seen, examined and agree w assess/plan as above with additions as indicated.  [facility] 02/21/2018, 3:34 PM

## 2018-02-21 NOTE — Progress Notes (Addendum)
Daily Rounding Note  02/21/2018, 12:30 PM  LOS: 3 days   SUBJECTIVE:   Chief complaint: Anemia, obscure GI bleeding suspicion it is from the small bowel.  Chronic abdominal pain and nausea. Complaining of left abdominal pain and nausea.  She feels cold.  No fever.  Last bowel movement was on 2/14. Dr. Rebeca Alert ordered CT of abdomen and pelvis without contrast.  Also ordered vancomycin Also noted are orders for peritoneal fluid analysis but I do not see orders for a paracentesis. Patient tells me there is concern that she has PD associated peritoneal infection. No elevation of her WBCs.  No fevers.   OBJECTIVE:         Vital signs in last 24 hours:    Temp:  [98 F (36.7 C)-99.3 F (37.4 C)] 99.1 F (37.3 C) (02/17 1215) Pulse Rate:  [64-87] 87 (02/17 1215) Resp:  [18-22] 20 (02/17 0943) BP: (93-128)/(44-87) 101/62 (02/17 1215) SpO2:  [97 %-100 %] 99 % (02/17 1215) Weight:  [115.9 kg-123.6 kg] 115.9 kg (02/17 1215) Last BM Date: 02/20/18 Filed Weights   02/20/18 1850 02/20/18 2128 02/21/18 1215  Weight: 120.4 kg 123.6 kg 115.9 kg   General: Looks well just slightly uncomfortable.  Laying flat in bed. Heart: RRR Chest: Clear Abdomen: Obese, soft, active bowel sounds.  Minor left-sided tenderness.  Benign looking PD catheter. Extremities: No CCE. Neuro/Psych: Fully alert and oriented.  No gross deficits, moving all 4 limbs.  Intake/Output from previous day: 02/16 0701 - 02/17 0700 In: 16109 [P.O.:1378] Out: 17155   Intake/Output this shift: No intake/output data recorded.  Lab Results: Recent Labs    02/19/18 1835 02/20/18 0303 02/21/18 0555  WBC 6.8 7.4 9.0  HGB 7.9* 8.2* 8.5*  HCT 25.2* 25.9* 27.5*  PLT 254 281 286   BMET Recent Labs    02/19/18 0605 02/20/18 0303 02/21/18 0555  NA 138 137 139  K 3.0* 2.9* 3.0*  CL 99 97* 97*  CO2 21* 24 22  GLUCOSE 117* 124* 135*  BUN 52* 51* 52*    CREATININE 14.59* 14.54* 14.80*  CALCIUM 7.6* 8.1* 8.0*   LFT Recent Labs    02/18/18 1301 02/19/18 0605 02/20/18 0303 02/21/18 0555  PROT 6.9  --   --   --   ALBUMIN 3.4* 3.0* 3.2* 3.3*  AST 33  --   --   --   ALT 27  --   --   --   ALKPHOS 42  --   --   --   BILITOT 0.5  --   --   --    PT/INR No results for input(s): LABPROT, INR in the last 72 hours. Hepatitis Panel Recent Labs    02/19/18 0605  HEPBSAG Negative    Studies/Results: Dg Abd Decub  Result Date: 02/21/2018 CLINICAL DATA:  Acute left-sided abdominal pain, nausea. EXAM: ABDOMEN - 1 VIEW DECUBITUS COMPARISON:  Radiographs of same day. FINDINGS: The bowel gas pattern is normal. No radio-opaque calculi are seen. Free air is seen along the right side of the abdomen which most likely is related to the history of peritoneal dialysis. IMPRESSION: Pneumoperitoneum is noted along the right side of the lateral abdominal wall on this decubitus projection that most likely is related to peritoneal dialysis. No evidence of bowel obstruction or ileus. Electronically Signed   By: Marijo Conception, M.D.   On: 02/21/2018 11:56   Dg Abd Portable 1v  Result Date:  02/21/2018 CLINICAL DATA:  Upper abdominal pain. EXAM: PORTABLE ABDOMEN - 1 VIEW COMPARISON:  CT scan of December 23, 2017. FINDINGS: The bowel gas pattern is normal. Phleboliths are noted in the pelvis. Distal tip of peritoneal dialysis catheter is seen in the right side of the pelvis. IMPRESSION: No evidence of bowel obstruction or ileus. Distal tip of peritoneal dialysis catheter seen in right-sided pelvis. Electronically Signed   By: Marijo Conception, M.D.   On: 02/21/2018 10:34   Scheduled Meds: . allopurinol  100 mg Oral QHS  . atorvastatin  40 mg Oral QHS  . calcitRIOL  0.5 mcg Oral QHS  . cinacalcet  30 mg Oral Q supper  . gentamicin cream  1 application Topical Daily  . lanthanum  1,000 mg Oral TID WC  . metoprolol tartrate  25 mg Oral QHS  . multivitamin  1  tablet Oral QHS  . pantoprazole  40 mg Oral BID  . potassium chloride  40 mEq Oral BID  . ramelteon  8 mg Oral QHS  . sodium chloride flush  3 mL Intravenous Q12H  . sodium chloride flush  3 mL Intravenous Q12H  . vancomycin variable dose per unstable renal function (pharmacist dosing)   Does not apply See admin instructions  . venlafaxine XR  75 mg Oral QHS   Continuous Infusions: . sodium chloride    . cefTAZidime (FORTAZ)  IV 1 g (02/21/18 1205)  . dialysis solution 2.5% low-MG/low-CA    . vancomycin 2,000 mg (02/21/18 1206)   PRN Meds:.sodium chloride, acetaminophen **OR** acetaminophen, ALPRAZolam, dianeal solution for CAPD/CCPD with heparin, HYDROmorphone, hydrOXYzine, phenol, promethazine, sodium chloride flush   ASSESMENT:   *   Acute on chronic intermittent obscure GI bleeding.  Extensive recent evaluation with colonoscopy, enteroscopy's, capsule endoscopy.  Suspicion is she is bleeding from the mid small bowel due to AVMs and needs balloon enteroscopy. Received depot octreotide 3 days ago.  *    Blood loss anemia.  Hgb 8.4 >> 7.9  >> 8.5.  MCV normal at 91.  Has not required blood transfusions during this admission.  *   ESRD, on peritoneal dialysis.  *    Chronic waxing/waning abdominal pain and nausea.  Has gastric emptying study arranged for 2/27.  C/o pain, nausea today.     PLAN   *    Inpatient versus outpatient single balloon enteroscopy for 2/27 Given the stability of her Hgb, I think she can probably go home and follow-up with Dr. Rush Landmark but will clear this with the attending GI MD, Dr. Fuller Plan.  *   Insofar as her abdominal pain, nausea goes, this is not a new issue.  Await findings of CT scan and PD fluid analysis.  *   Senokot, 2 tablets now and daily prn thereafter.  Senokot usually works for her at home.   Azucena Freed  02/21/2018, 12:30 PM Phone [phone]     Attending Physician Note   I have taken an interval history, reviewed the chart  and examined the patient. I agree with the Advanced Practitioner's note, impression and recommendations. Await CT and PD fluid results. If she does not have recurrent bleeding OK for outpatient balloon enteroscopy with Drs. Mansouraty or Cirigliano.   Lucio Edward, MD FACG [phone]

## 2018-02-21 NOTE — Telephone Encounter (Signed)
Thank you for update. I have discussed with the Inpatient GI service. Will consider possible Inpatient SBE vs Outpatient SBE. Further evaluation dictated by the GI Inpatient service this week. Thanks. GM

## 2018-02-22 DIAGNOSIS — T8571XA Infection and inflammatory reaction due to peritoneal dialysis catheter, initial encounter: Secondary | ICD-10-CM | POA: Diagnosis present

## 2018-02-22 DIAGNOSIS — K659 Peritonitis, unspecified: Secondary | ICD-10-CM

## 2018-02-22 LAB — CBC WITH DIFFERENTIAL/PLATELET
Band Neutrophils: 0 %
Basophils Absolute: 0 10*3/uL (ref 0.0–0.1)
Basophils Relative: 0 %
Blasts: 0 %
Eosinophils Absolute: 0 10*3/uL (ref 0.0–0.5)
Eosinophils Relative: 0 %
HCT: 26.4 % — ABNORMAL LOW (ref 36.0–46.0)
Hemoglobin: 8.6 g/dL — ABNORMAL LOW (ref 12.0–15.0)
Lymphocytes Relative: 20 %
Lymphs Abs: 2.1 10*3/uL (ref 0.7–4.0)
MCH: 29.2 pg (ref 26.0–34.0)
MCHC: 32.6 g/dL (ref 30.0–36.0)
MCV: 89.5 fL (ref 80.0–100.0)
Metamyelocytes Relative: 0 %
Monocytes Absolute: 0.6 10*3/uL (ref 0.1–1.0)
Monocytes Relative: 6 %
Myelocytes: 0 %
Neutro Abs: 7.8 10*3/uL — ABNORMAL HIGH (ref 1.7–7.7)
Neutrophils Relative %: 74 %
Platelets: 299 10*3/uL (ref 150–400)
Promyelocytes Relative: 0 %
RBC: 2.95 MIL/uL — ABNORMAL LOW (ref 3.87–5.11)
RDW: 16.1 % — ABNORMAL HIGH (ref 11.5–15.5)
WBC: 10.5 10*3/uL (ref 4.0–10.5)
nRBC: 0 /100 WBC
nRBC: 1.5 % — ABNORMAL HIGH (ref 0.0–0.2)

## 2018-02-22 LAB — RENAL FUNCTION PANEL
Albumin: 3 g/dL — ABNORMAL LOW (ref 3.5–5.0)
Anion gap: 19 — ABNORMAL HIGH (ref 5–15)
BUN: 47 mg/dL — ABNORMAL HIGH (ref 6–20)
CO2: 20 mmol/L — ABNORMAL LOW (ref 22–32)
Calcium: 8 mg/dL — ABNORMAL LOW (ref 8.9–10.3)
Chloride: 93 mmol/L — ABNORMAL LOW (ref 98–111)
Creatinine, Ser: 14.17 mg/dL — ABNORMAL HIGH (ref 0.44–1.00)
GFR calc Af Amer: 3 mL/min — ABNORMAL LOW (ref >60)
GFR calc non Af Amer: 3 mL/min — ABNORMAL LOW (ref >60)
Glucose, Bld: 130 mg/dL — ABNORMAL HIGH (ref 70–99)
Phosphorus: 6.5 mg/dL — ABNORMAL HIGH (ref 2.5–4.6)
Potassium: 3.2 mmol/L — ABNORMAL LOW (ref 3.5–5.1)
Sodium: 132 mmol/L — ABNORMAL LOW (ref 135–145)

## 2018-02-22 LAB — MAGNESIUM: Magnesium: 1.4 mg/dL — ABNORMAL LOW (ref 1.7–2.4)

## 2018-02-22 LAB — PATHOLOGIST SMEAR REVIEW

## 2018-02-22 MED ORDER — HYDROMORPHONE HCL 1 MG/ML IJ SOLN
0.5000 mg | Freq: Four times a day (QID) | INTRAMUSCULAR | Status: DC | PRN
  Administered 2018-02-22: 0.5 mg via INTRAVENOUS
  Filled 2018-02-22: qty 1

## 2018-02-22 MED ORDER — HYDROMORPHONE HCL 2 MG PO TABS
1.0000 mg | ORAL_TABLET | Freq: Once | ORAL | Status: AC
  Administered 2018-02-22: 1 mg via ORAL
  Filled 2018-02-22: qty 1

## 2018-02-22 MED ORDER — HYDROMORPHONE HCL 1 MG/ML IJ SOLN
1.0000 mg | Freq: Four times a day (QID) | INTRAMUSCULAR | Status: DC | PRN
  Administered 2018-02-22 – 2018-02-23 (×3): 1 mg via INTRAVENOUS
  Filled 2018-02-22 (×3): qty 1

## 2018-02-22 NOTE — Care Management Important Message (Signed)
Important Message  Patient Details  Name: Patricia [name] MRN: [id number] Date of Birth: [birth year]   Medicare Important Message Given:  Yes    Jakim Drapeau P Emelie Newsom 02/22/2018, 1:03 PM

## 2018-02-22 NOTE — Progress Notes (Addendum)
Daily Rounding Note  02/22/2018, 12:53 PM  LOS: 4 days   SUBJECTIVE:   Chief complaint:  Anemia, obscure GI bleeding suspicion it is from the small bowel.  Chronic abdominal pain and nausea. Pain continues on left abdomen..  The nausea is not as bad.   OBJECTIVE:         Vital signs in last 24 hours:    Temp:  [97.8 F (36.6 C)-101.7 F (38.7 C)] 97.8 F (36.6 C) (02/18 0936) Pulse Rate:  [66-95] 66 (02/18 0930) Resp:  [18-20] 18 (02/18 0936) BP: (98-125)/(57-78) 125/78 (02/18 0936) SpO2:  [96 %-99 %] 99 % (02/18 0930) Weight:  [119.7 kg] 119.7 kg (02/17 1939) Last BM Date: 02/21/18 [facility] Weights   02/20/18 2128 02/21/18 1215 02/21/18 1939  Weight: 123.6 kg 115.9 kg 119.7 kg   General: Does not look toxic. Heart: RRR. Chest: No dyspnea, clear bilaterally. Abdomen: Soft.  Tender throughout mostly on the left side.  No guarding or rebound.  Active bowel sounds.  Not distended. Extremities: CCE. Neuro/Psych:.  Oriented x3.  Intake/Output from previous day: 02/17 0701 - 02/18 0700 In: 400 [P.O.:300; IV Piggyback:100] Out: 0   Intake/Output this shift: No intake/output data recorded.  Lab Results: Recent Labs    02/20/18 0303 02/21/18 0555 02/22/18 0357  WBC 7.4 9.0 10.5  HGB 8.2* 8.5* 8.6*  HCT 25.9* 27.5* 26.4*  PLT 281 286 299   BMET Recent Labs    02/21/18 0555 02/21/18 1305 02/22/18 0357  NA 139 135 132*  K 3.0* 3.5 3.2*  CL 97* 94* 93*  CO2 22 22 20*  GLUCOSE 135* 128* 130*  BUN 52* 51* 47*  CREATININE 14.80* 14.68* 14.17*  CALCIUM 8.0* 8.1* 8.0*   LFT Recent Labs    02/21/18 0555 02/21/18 1305 02/22/18 0357  ALBUMIN 3.3* 3.3* 3.0*   PT/INR No results for input(s): LABPROT, INR in the last 72 hours. Hepatitis Panel No results for input(s): HEPBSAG, HCVAB, HEPAIGM, HEPBIGM in the last 72 hours.  Studies/Results: Ct Abdomen Pelvis Wo Contrast  Result Date:  02/21/2018 CLINICAL DATA:  Peritonitis, possible bowel perforation EXAM: CT ABDOMEN AND PELVIS WITHOUT CONTRAST TECHNIQUE: Multidetector CT imaging of the abdomen and pelvis was performed following the standard protocol without IV contrast. COMPARISON:  Same-day abdominal radiographs, 02/21/2018, CT abdomen pelvis, 12/23/2017 FINDINGS: Lower chest: Right basilar scarring or atelectasis. Hepatobiliary: No focal liver abnormality is seen. No gallstones, gallbladder wall thickening, or biliary dilatation. Pancreas: Unremarkable. No pancreatic ductal dilatation or surrounding inflammatory changes. Spleen: Normal in size without focal abnormality. Adrenals/Urinary Tract: Adrenal glands are unremarkable. Kidneys are normal, without renal calculi, focal lesion, or hydronephrosis. Bladder is unremarkable. Stomach/Bowel: Stomach is within normal limits. Appendix appears normal. No evidence of bowel wall thickening, distention, or inflammatory changes. Occasional sigmoid diverticula with high-density stool balls in the distal colon and rectum. Vascular/Lymphatic: No significant vascular findings are present. No enlarged abdominal or pelvic lymph nodes. Reproductive: No mass or other abnormality. Status post hysterectomy. Other: No abdominal wall hernia or abnormality. Trace abdominal ascites. Small volume pneumoperitoneum in the greater and lesser peritoneal spaces. Tenckhoff type peritoneal dialysis catheter is positioned with tip coiled in the midline low abdomen. Musculoskeletal: No acute or significant osseous findings. IMPRESSION: 1. Trace abdominal ascites. Small volume pneumoperitoneum in the greater and lesser peritoneal spaces. Likely etiology of pneumoperitoneum and ascites is peritoneal dialysis. Tenckhoff type peritoneal dialysis catheter is positioned with tip coiled in the midline low  abdomen. 2. Occasional sigmoid diverticula with high-density stool balls in the distal colon and rectum. No findings to suggest  diverticulitis or other etiology of bowel perforation. Electronically Signed   By: Eddie Candle M.D.   On: 02/21/2018 15:12   Dg Abd Decub  Result Date: 02/21/2018 CLINICAL DATA:  Acute left-sided abdominal pain, nausea. EXAM: ABDOMEN - 1 VIEW DECUBITUS COMPARISON:  Radiographs of same day. FINDINGS: The bowel gas pattern is normal. No radio-opaque calculi are seen. Free air is seen along the right side of the abdomen which most likely is related to the history of peritoneal dialysis. IMPRESSION: Pneumoperitoneum is noted along the right side of the lateral abdominal wall on this decubitus projection that most likely is related to peritoneal dialysis. No evidence of bowel obstruction or ileus. Electronically Signed   By: Marijo Conception, M.D.   On: 02/21/2018 11:56   Dg Abd Portable 1v  Result Date: 02/21/2018 CLINICAL DATA:  Upper abdominal pain. EXAM: PORTABLE ABDOMEN - 1 VIEW COMPARISON:  CT scan of December 23, 2017. FINDINGS: The bowel gas pattern is normal. Phleboliths are noted in the pelvis. Distal tip of peritoneal dialysis catheter is seen in the right side of the pelvis. IMPRESSION: No evidence of bowel obstruction or ileus. Distal tip of peritoneal dialysis catheter seen in right-sided pelvis. Electronically Signed   By: Marijo Conception, M.D.   On: 02/21/2018 10:34   Scheduled Meds: . allopurinol  100 mg Oral QHS  . atorvastatin  40 mg Oral QHS  . calcitRIOL  0.5 mcg Oral QHS  . cinacalcet  30 mg Oral Q supper  . gentamicin cream  1 application Topical Daily  . lanthanum  1,000 mg Oral TID WC  . magnesium oxide  400 mg Oral BID  . metoprolol tartrate  25 mg Oral QHS  . multivitamin  1 tablet Oral QHS  . pantoprazole  40 mg Oral BID  . potassium chloride  40 mEq Oral BID  . ramelteon  8 mg Oral QHS  . sodium chloride flush  3 mL Intravenous Q12H  . vancomycin variable dose per unstable renal function (pharmacist dosing)   Does not apply See admin instructions  . venlafaxine XR  75  mg Oral QHS   Continuous Infusions: . sodium chloride    . cefTAZidime (FORTAZ)  IV Stopped (02/22/18 1156)  . dialysis solution 2.5% low-MG/low-CA     PRN Meds:.sodium chloride, acetaminophen **OR** acetaminophen, ALPRAZolam, dianeal solution for CAPD/CCPD with heparin, HYDROmorphone (DILAUDID) injection, hydrOXYzine, phenol, promethazine, senna    ASSESMENT:   *   Acute on chronic intermittent obscure GI bleeding.  Extensive recent evaluation with colonoscopy, enteroscopy's, capsule endoscopy.  Suspicion is she is bleeding from the mid small bowel due to AVMs and needs balloon enteroscopy. Received depot octreotide 3 days ago.  *    Blood loss anemia.  Hgb low but stable.  MCV normal at 91.  Has not required blood transfusions during this admission.  *   ESRD, on peritoneal dialysis.  Hyponatremia and hypokalemia.    *    Chronic waxing/waning abdominal pain and nausea.  Has gastric emptying study arranged for 2/27.   2100 WBCs in peritoneal fluid.  So this looks like peritonitis.  On Fortaz and Vancomycin Day 2.   Will be intetesting to see if abx and treating peritonitis will resolve all her GI complaints.  She was having sxs for years and no peritonitis in 05/2017.   CT abd/pelvis: other than the  coiled dialysis catheter, and it is not clear this would cause pain, nothing acute to explain acute on chronic sxs.      PLAN   *  Will sign off.  Continue tx of peritonitis, if you need guidance as to duration or tx, ask ID or renal for their guidance.    *     Dr. Donneta Romberg office will be in contact with the patient regarding follow-up of the gastric emptying study and outpatient balloon enteroscopy   Azucena Freed  02/22/2018, 12:53 PM Phone [phone]     Attending Physician Note   I have taken an interval history, reviewed the chart and examined the patient. I agree with the Advanced Practitioner's note, impression and recommendations. Treatment of PD related  peritonitis per primary service/renal service. Received depo octreotide. Balloon enteroscopy planned as outpatient with Dr. Rush Landmark. GI signing off.   Lucio Edward, MD FACG [phone]

## 2018-02-22 NOTE — Progress Notes (Signed)
  Date: 02/22/2018  [name]  Medical record number: [id number]  Date of birth: [birth year]   I have seen and evaluated this patient and I have discussed the plan of care with the house staff. Please see their note for complete details. I concur with their findings with the following additions/corrections:   Continued left-sided abdominal pain today, tender on exam. Peritoneal fluid shows elevated white count consistent with peritonitis, gram stain negative (but drawn after IV abx). Will follow culture, keep abx broad for now, would favor giving abx IV and intrapertioneally.   Lenice Pressman, M.D., Ph.D. 02/22/2018, 2:45 PM

## 2018-02-22 NOTE — Progress Notes (Addendum)
   Subjective:  Patricia [name] is a 43 y.o. 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 She was observed in significant distress due to abdominal pain. She states yesterday afternoon her abdominal pain improved after bowel movement but had re-occurrence overnight with febrile episodes. Currently continuing to endorse LLQ abdominal pain worsened by laying on left side. Endorsing no vomiting or diarrhea but has loss of appetite.  Objective:  Vital signs in last 24 hours: Vitals:   02/21/18 2151 02/22/18 0515 02/22/18 0930 02/22/18 0936  BP: 104/61 107/72 125/78 125/78  Pulse: 74 71 66   Resp: $Remo'19 19 18 18  'Dtlrj$ Temp: 100.2 F (37.9 C) 98.7 F (37.1 C) 97.8 F (36.6 C) 97.8 F (36.6 C)  TempSrc: Oral Oral Oral Oral  SpO2: 97% 96% 99%   Weight:      Height:       Physical Exam  Constitutional: She is oriented to person, place, and time. She appears distressed.  obese  Cardiovascular: Normal rate, regular rhythm, normal heart sounds and intact distal pulses.  Pulmonary/Chest: Effort normal and breath sounds normal. She has no wheezes. She has no rales.  Abdominal: Soft. Bowel sounds are normal. There is abdominal tenderness (LLQ tenderness to palpation). There is no guarding.  Musculoskeletal: Normal range of motion.        General: No edema.  Neurological: She is alert and oriented to person, place, and time.  Skin: Skin is warm and dry.   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 Her hospitalization is complicated by now-confirmed peritonitis. Currently receiving IV vanc and ceftazidme. Unfortunately culture collection was delayed til after 1st abx treatment and may not have any good data to guide abx therapy. Continuing to be febrile overnight. Will try to get blood culture if another febrile episode occurs.  Acute onset abdominal pain 2/2 peritonitis Temp peak 101.66F yesterday evening. WBC 9.0->10.5. Dialysis fluid cell count show 2100 WBC with neutrophil predominance. CT abd/pelvis show air consistent with expected pd changes and diverticulosis w/o evidence of inflammation. - Appreciate nephro recs - F/u peritoneal fluid culture - C/w ceftazidime, vanc - Trend CBC  Melena, Symptomatic Anemia 2/2 bleeding small bowel AVM Negative upper endoscopy on admission. Hgb trend 8.4->7.8-->7.9->8.2->8.6 - Appreciate GI recs: Balloon enteroscopy as outpatient on 2/27 - C/w pantoprazole $RemoveBeforeDE'40mg'[id number]$  BID, Zofran $RemoveBef'4mg'PSdtjRFBhm$  q6hr PRN - Trend CBC - Transfuse if Hgb<7  ESRD on peritoneal dialysis:  Peritoneal dialysis managed by nephro. K 3.2 - Appreciate Nephro recs: K & Mag repletion - Trend renal panel - C/w home meds: cinacalcet $RemoveBeforeDE'30mg'xxYZuWwoXdWcuhc$  qhs, calcitriol 0.34mcg qhs, Rena-Vit daily qhs, hydroxyzine $RemoveBeforeDE'25mg'kqwbRHzSztdZRRO$  q6hr PRN for itching  DVT prophx: SCDs Diet: Regular Code: Full  Dispo: Anticipated discharge in approximately 3-4 day(s).   Mosetta Anis, MD 02/22/2018, 12:04 PM Pager: [phone]

## 2018-02-22 NOTE — Progress Notes (Addendum)
[facility] Progress Note   Subjective:  Seen in room - ongoing L abdominal pain, worse overnight and today. Febrile overnight to 100.82F. PD effluent remains cloudy - cell count c/w peritonitis. PD fluid Cx pending, unfortunately abx were started before it was collected. Had "normal" BM yesterday - no melena. No CP/dyspnea.   Objective Vitals:   02/21/18 2151 02/22/18 0515 02/22/18 0930 02/22/18 0936  BP: 104/61 107/72 125/78 125/78  Pulse: 74 71 66   Resp: $Remo'19 19 18 18  'AVcyE$ Temp: 100.2 F (37.9 C) 98.7 F (37.1 C) 97.8 F (36.6 C) 97.8 F (36.6 C)  TempSrc: Oral Oral Oral Oral  SpO2: 97% 96% 99%   Weight:      Height:       Physical Exam General: Uncomfortable appearing woman. Heart: RRR; no murmur Lungs: CTAB Abdomen: soft, tenderness to palpation across whole abdomen, worse in LLQ (around PD cath). Cath exit site without drainage, no erythema Extremities: No LE edema  Additional Objective Labs: Basic Metabolic Panel: Recent Labs  Lab 02/21/18 0555 02/21/18 1305 02/22/18 0357  NA 139 135 132*  K 3.0* 3.5 3.2*  CL 97* 94* 93*  CO2 22 22 20*  GLUCOSE 135* 128* 130*  BUN 52* 51* 47*  CREATININE 14.80* 14.68* 14.17*  CALCIUM 8.0* 8.1* 8.0*  PHOS 7.6* 6.3* 6.5*   Liver Function Tests: Recent Labs  Lab 02/18/18 1301  02/21/18 0555 02/21/18 1305 02/22/18 0357  AST 33  --   --   --   --   ALT 27  --   --   --   --   ALKPHOS 42  --   --   --   --   BILITOT 0.5  --   --   --   --   PROT 6.9  --   --   --   --   ALBUMIN 3.4*   < > 3.3* 3.3* 3.0*   < > = values in this interval not displayed.   No results for input(s): LIPASE, AMYLASE in the last 168 hours. CBC: Recent Labs  Lab 02/18/18 0926  02/19/18 0605 02/19/18 1835 02/20/18 0303 02/21/18 0555 02/22/18 0357  WBC 8.7   < > 7.6 6.8 7.4 9.0 10.5  NEUTROABS 5.8  --   --   --   --   --  7.8*  HGB 8.3 Repeated and verified X2.*   < > 7.8* 7.9* 8.2* 8.5* 8.6*  HCT 25.0 Repeated and verified  X2.*   < > 24.2* 25.2* 25.9* 27.5* 26.4*  MCV 90.3   < > 90.6 90.6 91.5 91.1 89.5  PLT 256.0   < > 256 254 281 286 299   < > = values in this interval not displayed.   Blood Culture    Component Value Date/Time   SDES PERITONEAL DIALYSATE 02/21/2018 2230   SPECREQUEST NONE 02/21/2018 2230   CULT  02/21/2018 2230    NO GROWTH < 12 HOURS Performed at [facility] [address] [place], [place] [address]    REPTSTATUS PENDING 02/21/2018 2230   Studies/Results: Ct Abdomen Pelvis Wo Contrast  Result Date: 02/21/2018 CLINICAL DATA:  Peritonitis, possible bowel perforation EXAM: CT ABDOMEN AND PELVIS WITHOUT CONTRAST TECHNIQUE: Multidetector CT imaging of the abdomen and pelvis was performed following the standard protocol without IV contrast. COMPARISON:  Same-day abdominal radiographs, 02/21/2018, CT abdomen pelvis, 12/23/2017 FINDINGS: Lower chest: Right basilar scarring or atelectasis. Hepatobiliary: No focal liver abnormality is seen.  No gallstones, gallbladder wall thickening, or biliary dilatation. Pancreas: Unremarkable. No pancreatic ductal dilatation or surrounding inflammatory changes. Spleen: Normal in size without focal abnormality. Adrenals/Urinary Tract: Adrenal glands are unremarkable. Kidneys are normal, without renal calculi, focal lesion, or hydronephrosis. Bladder is unremarkable. Stomach/Bowel: Stomach is within normal limits. Appendix appears normal. No evidence of bowel wall thickening, distention, or inflammatory changes. Occasional sigmoid diverticula with high-density stool balls in the distal colon and rectum. Vascular/Lymphatic: No significant vascular findings are present. No enlarged abdominal or pelvic lymph nodes. Reproductive: No mass or other abnormality. Status post hysterectomy. Other: No abdominal wall hernia or abnormality. Trace abdominal ascites. Small volume pneumoperitoneum in the greater and lesser peritoneal spaces. Tenckhoff type peritoneal dialysis  catheter is positioned with tip coiled in the midline low abdomen. Musculoskeletal: No acute or significant osseous findings. IMPRESSION: 1. Trace abdominal ascites. Small volume pneumoperitoneum in the greater and lesser peritoneal spaces. Likely etiology of pneumoperitoneum and ascites is peritoneal dialysis. Tenckhoff type peritoneal dialysis catheter is positioned with tip coiled in the midline low abdomen. 2. Occasional sigmoid diverticula with high-density stool balls in the distal colon and rectum. No findings to suggest diverticulitis or other etiology of bowel perforation. Electronically Signed   By: Eddie Candle M.D.   On: 02/21/2018 15:12   Dg Abd Decub  Result Date: 02/21/2018 CLINICAL DATA:  Acute left-sided abdominal pain, nausea. EXAM: ABDOMEN - 1 VIEW DECUBITUS COMPARISON:  Radiographs of same day. FINDINGS: The bowel gas pattern is normal. No radio-opaque calculi are seen. Free air is seen along the right side of the abdomen which most likely is related to the history of peritoneal dialysis. IMPRESSION: Pneumoperitoneum is noted along the right side of the lateral abdominal wall on this decubitus projection that most likely is related to peritoneal dialysis. No evidence of bowel obstruction or ileus. Electronically Signed   By: Marijo Conception, M.D.   On: 02/21/2018 11:56   Dg Abd Portable 1v  Result Date: 02/21/2018 CLINICAL DATA:  Upper abdominal pain. EXAM: PORTABLE ABDOMEN - 1 VIEW COMPARISON:  CT scan of December 23, 2017. FINDINGS: The bowel gas pattern is normal. Phleboliths are noted in the pelvis. Distal tip of peritoneal dialysis catheter is seen in the right side of the pelvis. IMPRESSION: No evidence of bowel obstruction or ileus. Distal tip of peritoneal dialysis catheter seen in right-sided pelvis. Electronically Signed   By: Marijo Conception, M.D.   On: 02/21/2018 10:34   Medications: . sodium chloride    . cefTAZidime (FORTAZ)  IV 1 g (02/21/18 1205)  . dialysis solution  2.5% low-MG/low-CA     . allopurinol  100 mg Oral QHS  . atorvastatin  40 mg Oral QHS  . calcitRIOL  0.5 mcg Oral QHS  . cinacalcet  30 mg Oral Q supper  . gentamicin cream  1 application Topical Daily  . lanthanum  1,000 mg Oral TID WC  . magnesium oxide  400 mg Oral BID  . metoprolol tartrate  25 mg Oral QHS  . multivitamin  1 tablet Oral QHS  . pantoprazole  40 mg Oral BID  . potassium chloride  40 mEq Oral BID  . ramelteon  8 mg Oral QHS  . sodium chloride flush  3 mL Intravenous Q12H  . sodium chloride flush  3 mL Intravenous Q12H  . vancomycin variable dose per unstable renal function (pharmacist dosing)   Does not apply See admin instructions  . venlafaxine XR  75 mg Oral QHS  Dialysis Orders: CCPD 7x/week 5 exchanges Fill vol 3000 ml Dwell Time 2 hours. 1 day exchange fill volume 3000L, Day pause 4 hours. - Outpatient Hgb: 10.9 (01/14/18), 9.0 (02/09/18)  Assessment/Plan: 1. Symptomatic anemia/melena: Followed by [facility]. Multiplepast procedures have been unrevealing.S/p EGD 2/14no bleeding lesions seen.GI following with concern that AVMs in small bowel may be oozing. May need further interventionper GI. 2. PD cath-related peritonitis: New cloudy PD effluent and L abd pain starting 2/17 morning. Cell count Cx 2100, 88% neutrophils. Gram stain/Cx negative so far, but was given Vanc/Ceftaz prior to this collection. Last GI procedure 2/15 - s/p abd CT to make sure no significant free air concerning for perf - essentially negative. Will defer to primary team for pain control, IV abx per pharmacy (can change to IP on d/c). 3. ESRD: Continue CCPD while here. 6 exchanges, no day dwell while inpatient.  4. Hypokalemia/hypomagnesemia: K 3, cautiously ^ KCl to 25mEq BID for 2d on 2/17. Mg low as well, will give MgOx $RemoveB'400mg'AGVevGrY$  BID x 2d. 5. Hypertension/volume: BP controlled, home lisinopril on hold. No edema.  6. Anemia: Hgb stable/low. Last Mircera 225 dosed 02/15/18, tsat 54%.  transfuse prn. 7. Metabolic bone disease: Corr Ca ok/Phos high. Continue Calcitriol/Sensipar. Takes Velphoro as outpatient binder which is Fe based & can cause dark stools, changed to fosrenolhere. 8. Nutrition: Alb low, continue protein supplements.  Veneta Penton, PA-C 02/22/2018, 10:10 AM  [employer] Pager: [phone]  Pt seen, examined and agree w A/P as above.  [facility] 02/22/2018, 11:09 AM

## 2018-02-23 ENCOUNTER — Encounter: Payer: Self-pay | Admitting: Gastroenterology

## 2018-02-23 LAB — CBC
HCT: 28.8 % — ABNORMAL LOW (ref 36.0–46.0)
Hemoglobin: 9.1 g/dL — ABNORMAL LOW (ref 12.0–15.0)
MCH: 29 pg (ref 26.0–34.0)
MCHC: 31.6 g/dL (ref 30.0–36.0)
MCV: 91.7 fL (ref 80.0–100.0)
Platelets: 310 10*3/uL (ref 150–400)
RBC: 3.14 MIL/uL — ABNORMAL LOW (ref 3.87–5.11)
RDW: 16.9 % — ABNORMAL HIGH (ref 11.5–15.5)
WBC: 9.9 10*3/uL (ref 4.0–10.5)
nRBC: 2.1 % — ABNORMAL HIGH (ref 0.0–0.2)

## 2018-02-23 LAB — RENAL FUNCTION PANEL
Albumin: 2.9 g/dL — ABNORMAL LOW (ref 3.5–5.0)
Anion gap: 19 — ABNORMAL HIGH (ref 5–15)
BUN: 37 mg/dL — ABNORMAL HIGH (ref 6–20)
CO2: 20 mmol/L — ABNORMAL LOW (ref 22–32)
Calcium: 8.3 mg/dL — ABNORMAL LOW (ref 8.9–10.3)
Chloride: 96 mmol/L — ABNORMAL LOW (ref 98–111)
Creatinine, Ser: 12.55 mg/dL — ABNORMAL HIGH (ref 0.44–1.00)
GFR calc Af Amer: 4 mL/min — ABNORMAL LOW (ref >60)
GFR calc non Af Amer: 3 mL/min — ABNORMAL LOW (ref >60)
Glucose, Bld: 139 mg/dL — ABNORMAL HIGH (ref 70–99)
Phosphorus: 6.3 mg/dL — ABNORMAL HIGH (ref 2.5–4.6)
Potassium: 3.7 mmol/L (ref 3.5–5.1)
Sodium: 135 mmol/L (ref 135–145)

## 2018-02-23 LAB — MAGNESIUM: Magnesium: 1.6 mg/dL — ABNORMAL LOW (ref 1.7–2.4)

## 2018-02-23 MED ORDER — SODIUM CHLORIDE 0.9 % IV BOLUS
500.0000 mL | Freq: Once | INTRAVENOUS | Status: AC
  Administered 2018-02-23: 500 mL via INTRAVENOUS

## 2018-02-23 MED ORDER — DIPHENHYDRAMINE HCL 25 MG PO CAPS
25.0000 mg | ORAL_CAPSULE | Freq: Once | ORAL | Status: AC
  Administered 2018-02-23: 25 mg via ORAL
  Filled 2018-02-23: qty 1

## 2018-02-23 MED ORDER — DELFLEX-LC/1.5% DEXTROSE 344 MOSM/L IP SOLN
INTRAPERITONEAL | Status: DC
  Administered 2018-02-24: 5000 mL via INTRAPERITONEAL

## 2018-02-23 MED ORDER — MAGNESIUM OXIDE 400 (241.3 MG) MG PO TABS
400.0000 mg | ORAL_TABLET | Freq: Two times a day (BID) | ORAL | Status: AC
  Administered 2018-02-23 (×2): 400 mg via ORAL
  Filled 2018-02-23 (×2): qty 1

## 2018-02-23 MED ORDER — HYDROMORPHONE HCL 1 MG/ML IJ SOLN
1.0000 mg | INTRAMUSCULAR | Status: DC | PRN
  Administered 2018-02-23 – 2018-02-26 (×12): 1 mg via INTRAVENOUS
  Filled 2018-02-23 (×13): qty 1

## 2018-02-23 NOTE — Progress Notes (Addendum)
[facility] Progress Note   Subjective:   Patient seen in room. Ongoing L sided abdominal pain. Reports some ongoing nausea, no vomiting or diarrhea. No BM yesterday. Fever improved overnight. PD effluent appears more clear today. Reports some SOB with exertion when taking a bath, otherwise no SOB, dyspnea, chest pain or edema reported. On vanc and fortaz.   Objective Vitals:   02/22/18 2210 02/23/18 0504 02/23/18 0742 02/23/18 0958  BP:  95/63 122/84 96/68  Pulse:  (!) 101 90 68  Resp:  20  18  Temp: 99.9 F (37.7 C) 98.4 F (36.9 C) 98.5 F (36.9 C) 98.4 F (36.9 C)  TempSrc: Oral Oral Oral Oral  SpO2:  96% 99%   Weight:    118.2 kg  Height:       Physical Exam General: Well developed, well nourished. Appears tired. Heart: RRR, no murmurs, rubs or gallops Lungs: CTA bilaterally, no wheezing, rhonchi or rales Abdomen: Soft, non-distended. Tenderness to palpation L upper and lower quadrants, no rebound tenderness.  Extremities: No lower extremity edema. Dialysis Access: Cath exit site without drainage or erythema. Straw-colored effluent.   Additional Objective Labs: Basic Metabolic Panel: Recent Labs  Lab 02/21/18 0555 02/21/18 1305 02/22/18 0357  NA 139 135 132*  K 3.0* 3.5 3.2*  CL 97* 94* 93*  CO2 22 22 20*  GLUCOSE 135* 128* 130*  BUN 52* 51* 47*  CREATININE 14.80* 14.68* 14.17*  CALCIUM 8.0* 8.1* 8.0*  PHOS 7.6* 6.3* 6.5*   Liver Function Tests: Recent Labs  Lab 02/18/18 1301  02/21/18 0555 02/21/18 1305 02/22/18 0357  AST 33  --   --   --   --   ALT 27  --   --   --   --   ALKPHOS 42  --   --   --   --   BILITOT 0.5  --   --   --   --   PROT 6.9  --   --   --   --   ALBUMIN 3.4*   < > 3.3* 3.3* 3.0*   < > = values in this interval not displayed.   CBC: Recent Labs  Lab 02/18/18 0926  02/19/18 1835 02/20/18 0303 02/21/18 0555 02/22/18 0357 02/23/18 0428  WBC 8.7   < > 6.8 7.4 9.0 10.5 9.9  NEUTROABS 5.8  --   --   --   --   7.8*  --   HGB 8.3 Repeated and verified X2.*   < > 7.9* 8.2* 8.5* 8.6* 9.1*  HCT 25.0 Repeated and verified X2.*   < > 25.2* 25.9* 27.5* 26.4* 28.8*  MCV 90.3   < > 90.6 91.5 91.1 89.5 91.7  PLT 256.0   < > 254 281 286 299 310   < > = values in this interval not displayed.   Blood Culture    Component Value Date/Time   SDES PERITONEAL DIALYSATE 02/21/2018 2230   SPECREQUEST NONE 02/21/2018 2230   CULT  02/21/2018 2230    NO GROWTH 2 DAYS Performed at [facility], [place] [address] [place], [place] [address]    REPTSTATUS PENDING 02/21/2018 2230    Studies/Results: Ct Abdomen Pelvis Wo Contrast  Result Date: 02/21/2018 CLINICAL DATA:  Peritonitis, possible bowel perforation EXAM: CT ABDOMEN AND PELVIS WITHOUT CONTRAST TECHNIQUE: Multidetector CT imaging of the abdomen and pelvis was performed following the standard protocol without IV contrast. COMPARISON:  Same-day abdominal radiographs, 02/21/2018, CT abdomen pelvis,  12/23/2017 FINDINGS: Lower chest: Right basilar scarring or atelectasis. Hepatobiliary: No focal liver abnormality is seen. No gallstones, gallbladder wall thickening, or biliary dilatation. Pancreas: Unremarkable. No pancreatic ductal dilatation or surrounding inflammatory changes. Spleen: Normal in size without focal abnormality. Adrenals/Urinary Tract: Adrenal glands are unremarkable. Kidneys are normal, without renal calculi, focal lesion, or hydronephrosis. Bladder is unremarkable. Stomach/Bowel: Stomach is within normal limits. Appendix appears normal. No evidence of bowel wall thickening, distention, or inflammatory changes. Occasional sigmoid diverticula with high-density stool balls in the distal colon and rectum. Vascular/Lymphatic: No significant vascular findings are present. No enlarged abdominal or pelvic lymph nodes. Reproductive: No mass or other abnormality. Status post hysterectomy. Other: No abdominal wall hernia or abnormality. Trace abdominal ascites.  Small volume pneumoperitoneum in the greater and lesser peritoneal spaces. Tenckhoff type peritoneal dialysis catheter is positioned with tip coiled in the midline low abdomen. Musculoskeletal: No acute or significant osseous findings. IMPRESSION: 1. Trace abdominal ascites. Small volume pneumoperitoneum in the greater and lesser peritoneal spaces. Likely etiology of pneumoperitoneum and ascites is peritoneal dialysis. Tenckhoff type peritoneal dialysis catheter is positioned with tip coiled in the midline low abdomen. 2. Occasional sigmoid diverticula with high-density stool balls in the distal colon and rectum. No findings to suggest diverticulitis or other etiology of bowel perforation. Electronically Signed   By: Eddie Candle M.D.   On: 02/21/2018 15:12   Dg Abd Decub  Result Date: 02/21/2018 CLINICAL DATA:  Acute left-sided abdominal pain, nausea. EXAM: ABDOMEN - 1 VIEW DECUBITUS COMPARISON:  Radiographs of same day. FINDINGS: The bowel gas pattern is normal. No radio-opaque calculi are seen. Free air is seen along the right side of the abdomen which most likely is related to the history of peritoneal dialysis. IMPRESSION: Pneumoperitoneum is noted along the right side of the lateral abdominal wall on this decubitus projection that most likely is related to peritoneal dialysis. No evidence of bowel obstruction or ileus. Electronically Signed   By: Marijo Conception, M.D.   On: 02/21/2018 11:56   Medications: . sodium chloride    . cefTAZidime (FORTAZ)  IV 1 g (02/22/18 1538)  . dialysis solution 2.5% low-MG/low-CA     . allopurinol  100 mg Oral QHS  . atorvastatin  40 mg Oral QHS  . calcitRIOL  0.5 mcg Oral QHS  . cinacalcet  30 mg Oral Q supper  . gentamicin cream  1 application Topical Daily  . lanthanum  1,000 mg Oral TID WC  . magnesium oxide  400 mg Oral BID  . metoprolol tartrate  25 mg Oral QHS  . multivitamin  1 tablet Oral QHS  . pantoprazole  40 mg Oral BID  . ramelteon  8 mg Oral  QHS  . sodium chloride flush  3 mL Intravenous Q12H  . vancomycin variable dose per unstable renal function (pharmacist dosing)   Does not apply See admin instructions  . venlafaxine XR  75 mg Oral QHS    Dialysis Orders: CCPD 7x/week 5 exchanges Fill vol 3000 ml Dwell Time 2 hours. 1 day exchange fill volume 3000L,Day pause 4 hours. -Outpatient Hgb: 10.9 (01/14/18), 9.0 (02/09/18)  Assessment/Plan: 1. Symptomatic anemia/melena:Followed by [facility]. Multiplepast procedures have been unrevealing.S/p EGD2/14no bleedinglesions seen.GI following, may need balloon enteroscopy outpatient. 2. PD cath-relatedperitonitis: Continues to have abdominal pain. Effluent less cloudy today and afebrile. Cell count Cx 2100, 88% neutrophils. Gram stain/Cx no growth x2 days, but was given Vanc/Ceftaz prior to this collection. Last GI procedure 2/15 - abd CT  completed to r/o perforation and essentially negative. Will defer to primary team for pain control, IV abx per pharmacy (can change to IP on d/c). 3. ESRD: Continue CCPD while here. 6 exchanges, no day dwell while inpatient. 4. Hypokalemia/hypomagnesemia: K 3.2 on 2/18, KCl increased yesterday to 41mEq BIDfor 2d on 2/17. Mg low as well, started MgOx $RemoveBefo'400mg'yPpWKuaEwEM$  BID x 2d yesterday. Labs pending- follow.  5. Hypertension/volume:BP controlled, home lisinopril on hold. No edema. 6. Anemia: Hgb low/stable- 9.1. LastMircera 225 dosed 02/15/18, tsat 54%.  7. Metabolic bone disease:Last corrected Ca ok/Phoshigh.Continue Calcitriol/Sensipar. TakesVelphoroas outpatient binder, changed to fosrenolhere. 8. Nutrition:Alb low, continue protein supplements.  Anice Paganini, PA-C 02/23/2018, 11:05 AM  [facility] Pager: [phone]   Pt seen, examined and agree w A/P as above.  Dizzy and standing and soft BP's, will lower PD gluc conc to all 1.5% tonight and give 500 cc NS bolus. Infection starting to improve.  [facility] 02/23/2018, 3:36 PM

## 2018-02-23 NOTE — Progress Notes (Signed)
Patient's BP 95/63 HR 101 T 98.4 . Patient's MEWS score=2 Text paged MD on call. Awaiting call back. Maudean Hoffmann, Wonda Cheng, Therapist, sports

## 2018-02-23 NOTE — Progress Notes (Signed)
   Subjective:  Patricia [name] is a 43 y.o. 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 She is continuing to endorse significant left sided abdominal pain. She states the IV pain medication will 'take the edge away' but her pain returns fairly quickly afterwards. She states she had some nausea and dry heaves but denies any significant vomiting episode. No other bowel movement since 2 days ago. Denies any significant fevers, chills, respiratory distress.  Objective:  Vital signs in last 24 hours: Vitals:   02/22/18 2018 02/22/18 2210 02/23/18 0504 02/23/18 0742  BP: 117/82  95/63 122/84  Pulse: 76  (!) 101 90  Resp: 18  20   Temp: 99.1 F (37.3 C) 99.9 F (37.7 C) 98.4 F (36.9 C) 98.5 F (36.9 C)  TempSrc: Oral Oral Oral Oral  SpO2: 100%  96% 99%  Weight:      Height:        Gen: Well-developed, obese, Distressed CV: RRR, S1, S2 normal, No rubs, no murmurs, no gallops Pulm: CTAB, No rales, no wheezes, no dullness to percussion  Abd: Soft, hypoactive bowel sounds, LLQ tenderness to palpation without guarding. 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 Currently receiving IV vanc and ceftazidime for peritonitis. Current culture result no growth <12 hrs. Awaiting further results to guide antibiotic  therapy. Biggest issue is pain control and will increase frequency of pain meds.  Acute onset abdominal pain 2/2 peritonitis Afebrile overnight. WBC 9.0->10.5->9.9. Dialysis fluid cell count show 2100 WBC with neutrophil predominance. Appear to be improving consider fever free for 24 hrs and no leukocytosis despite continued abdominal pain. Will await culture results. - Appreciate nephro recs - F/u peritoneal fluid culture (NGTD <12 hrs) - C/w ceftazidime, vanc - Trend CBC  Melena, Symptomatic Anemia 2/2 bleeding small bowel AVM Negative upper endoscopy on admission. Hgb trend 7.9->8.2->8.6->9.1 No further melenic stools since admission. Bleeding appears to have resolved. - Appreciate GI recs: Balloon enteroscopy as outpatient on 2/27 - C/w pantoprazole $RemoveBeforeDE'40mg'ndfCRpjlDinGMOk$  BID, Zofran $RemoveBef'4mg'HpdvBWGsgR$  q6hr PRN - Trend CBC - Transfuse if Hgb<7  ESRD on peritoneal dialysis:  Peritoneal dialysis managed by nephro.  - Appreciate Nephro recs: K & Mag repletion - Trend renal panel - C/w home meds: cinacalcet $RemoveBeforeDE'30mg'XXIPdgLyocGSIvY$  qhs, calcitriol 0.41mcg qhs, Rena-Vit daily qhs, hydroxyzine $RemoveBeforeDE'25mg'darCUlYicxcmnKn$  q6hr PRN for itching  DVT prophx: SCDs Diet: Regular Code: Full  Dispo: Anticipated discharge in approximately 2-3 day(s).   Patricia Anis, MD 02/23/2018, 8:41 AM Pager: [phone]

## 2018-02-23 NOTE — Progress Notes (Signed)
  Date: 02/23/2018  [name]  Medical record number: [id number]  Date of birth: [birth year]   I have seen and evaluated this patient and I have discussed the plan of care with the house staff. Please see their note for complete details. I concur with their findings with the following additions/corrections:   Fever curve improving, still with significant left-sided abdominal pain and pain with movement. On vanc/ceftaz IV, per nephro will switch to intraperitoneal on discharge. Would like pain control better and afebrile before discharge. Increased frequency of IV dilaudid, will need to increase bowel regimen to avoid constipation.   No further GI bleeding, H/H stable.  Lenice Pressman, M.D., Ph.D. 02/23/2018, 5:01 PM

## 2018-02-24 LAB — CBC
HCT: 27.3 % — ABNORMAL LOW (ref 36.0–46.0)
Hemoglobin: 8.8 g/dL — ABNORMAL LOW (ref 12.0–15.0)
MCH: 29.6 pg (ref 26.0–34.0)
MCHC: 32.2 g/dL (ref 30.0–36.0)
MCV: 91.9 fL (ref 80.0–100.0)
Platelets: 284 10*3/uL (ref 150–400)
RBC: 2.97 MIL/uL — ABNORMAL LOW (ref 3.87–5.11)
RDW: 16.9 % — ABNORMAL HIGH (ref 11.5–15.5)
WBC: 7.6 10*3/uL (ref 4.0–10.5)
nRBC: 2.1 % — ABNORMAL HIGH (ref 0.0–0.2)

## 2018-02-24 LAB — VANCOMYCIN, RANDOM: Vancomycin Rm: 22

## 2018-02-24 LAB — MAGNESIUM: Magnesium: 1.6 mg/dL — ABNORMAL LOW (ref 1.7–2.4)

## 2018-02-24 MED ORDER — SENNA 8.6 MG PO TABS
2.0000 | ORAL_TABLET | Freq: Every day | ORAL | Status: DC
  Administered 2018-02-24 – 2018-02-27 (×4): 17.2 mg via ORAL
  Filled 2018-02-24 (×4): qty 2

## 2018-02-24 MED ORDER — POLYETHYLENE GLYCOL 3350 17 G PO PACK: 17.0000 g | PACK | Freq: Every day | ORAL | Status: DC | PRN

## 2018-02-24 MED ORDER — POLYETHYLENE GLYCOL 3350 17 G PO PACK
17.0000 g | PACK | Freq: Once | ORAL | Status: DC
  Filled 2018-02-24: qty 1

## 2018-02-24 MED ORDER — MAGNESIUM OXIDE 400 (241.3 MG) MG PO TABS
400.0000 mg | ORAL_TABLET | Freq: Two times a day (BID) | ORAL | Status: AC
  Administered 2018-02-24 (×2): 400 mg via ORAL
  Filled 2018-02-24 (×2): qty 1

## 2018-02-24 NOTE — Progress Notes (Addendum)
[facility] Progress Note   Subjective:   Patient seen in room. Still with ongoing L sided abdominal pain but continuing to improve. Reports some nausea but no vomiting, still with mild constipation. Bowel regimen increased by primary team. No fever reported. Effluent clear today. Denies SOB/dyspnea and chest pain. Reports some dizziness yesterday afternoon with soft blood pressure- changed to PD gluc conc 1.5% overnight and given 500 cc NS bolus.   Objective Vitals:   02/23/18 0958 02/23/18 1732 02/23/18 2045 02/24/18 0500  BP: 96/68 116/69 106/67 110/70  Pulse: 68 89 84 80  Resp: $Remo'18 18 20 19  'KLPHz$ Temp: 98.4 F (36.9 C) 98.1 F (36.7 C) 98.4 F (36.9 C) 98.9 F (37.2 C)  TempSrc: Oral Oral Oral Oral  SpO2:  100% 100% 100%  Weight: 118.2 kg 117.7 kg    Height:       Physical Exam General: Well developed, well nourished female in NAD Heart:RRR, no murmurs, rubs or gallops Lungs: Respirations even and unlabored. No wheezing, rhonchi or rales Abdomen:Soft, moderate tenderness L upper and lower quadrants. Normoactive BS Extremities: No edema or ischemic changes b/l lower extremities Dialysis Access: Cath exit site without drainage or erythema. Straw-colored effluent.   Additional Objective Labs: Basic Metabolic Panel: Recent Labs  Lab 02/21/18 1305 02/22/18 0357 02/23/18 1117  NA 135 132* 135  K 3.5 3.2* 3.7  CL 94* 93* 96*  CO2 22 20* 20*  GLUCOSE 128* 130* 139*  BUN 51* 47* 37*  CREATININE 14.68* 14.17* 12.55*  CALCIUM 8.1* 8.0* 8.3*  PHOS 6.3* 6.5* 6.3*   Liver Function Tests: Recent Labs  Lab 02/18/18 1301  02/21/18 1305 02/22/18 0357 02/23/18 1117  AST 33  --   --   --   --   ALT 27  --   --   --   --   ALKPHOS 42  --   --   --   --   BILITOT 0.5  --   --   --   --   PROT 6.9  --   --   --   --   ALBUMIN 3.4*   < > 3.3* 3.0* 2.9*   < > = values in this interval not displayed.   CBC: Recent Labs  Lab 02/18/18 0926  02/20/18 0303  02/21/18 0555 02/22/18 0357 02/23/18 0428 02/24/18 0534  WBC 8.7   < > 7.4 9.0 10.5 9.9 7.6  NEUTROABS 5.8  --   --   --  7.8*  --   --   HGB 8.3 Repeated and verified X2.*   < > 8.2* 8.5* 8.6* 9.1* 8.8*  HCT 25.0 Repeated and verified X2.*   < > 25.9* 27.5* 26.4* 28.8* 27.3*  MCV 90.3   < > 91.5 91.1 89.5 91.7 91.9  PLT 256.0   < > 281 286 299 310 284   < > = values in this interval not displayed.   Blood Culture    Component Value Date/Time   SDES PERITONEAL DIALYSATE 02/21/2018 2230   SPECREQUEST NONE 02/21/2018 2230   CULT  02/21/2018 2230    NO GROWTH 3 DAYS Performed at [facility], [place] [address] [place], [place] [address]    REPTSTATUS PENDING 02/21/2018 2230    Medications: . sodium chloride    . cefTAZidime (FORTAZ)  IV 1 g (02/23/18 1129)  . dialysis solution 1.5% low-MG/low-CA     . allopurinol  100 mg Oral QHS  . atorvastatin  40 mg Oral QHS  . calcitRIOL  0.5 mcg Oral QHS  . cinacalcet  30 mg Oral Q supper  . gentamicin cream  1 application Topical Daily  . lanthanum  1,000 mg Oral TID WC  . magnesium oxide  400 mg Oral BID  . metoprolol tartrate  25 mg Oral QHS  . multivitamin  1 tablet Oral QHS  . pantoprazole  40 mg Oral BID  . polyethylene glycol  17 g Oral Once  . ramelteon  8 mg Oral QHS  . senna  2 tablet Oral Daily  . sodium chloride flush  3 mL Intravenous Q12H  . vancomycin variable dose per unstable renal function (pharmacist dosing)   Does not apply See admin instructions  . venlafaxine XR  75 mg Oral QHS    Dialysis Orders: CCPD 7x/week 5 exchanges Fill vol 3000 ml Dwell Time 2 hours. 1 day exchange fill volume 3000L,Day pause 4 hours. -Outpatient Hgb: 10.9 (01/14/18), 9.0 (02/09/18)  Assessment/Plan: 1. Symptomatic anemia/melena:Followed by [facility]. Multiplepast procedures have been unrevealing.S/p EGD2/14no bleedinglesions seen.GI following, plan for balloon enteroscopy outpatient. 2. PD cath-relatedperitonitis:  Abdominal pain and effluent appear improved. Afebrile. Cell countCx 2100, 88% neutrophils. Gram stain/Cx no growth x2 days, but was given Vanc/Ceftaz prior to this collection.Last GI procedure 2/15 - abd CT completed to r/o perforation and essentially negative. Will defer to primary team for pain control, IV abx per pharmacy (can change to IP on d/c). 3. ESRD: Continue CCPD while here. Blood pressures improved, will continue PD gluc conc to all 1.5% tonight 6 exchanges, no day dwell while inpatient. 4. Hypokalemia/hypomagnesemia: K improved to 3.7 after 2 days of KCl, will d/c KCl and follow. Magnesium still low at 1.6, Mag Oxide has been reordered. Follow levels.  5. Hypertension/volume:BP controlled, home lisinopril on hold. No edema. 6. Anemia: Hgb low/stable- 8.8. LastMircera 225 dosed 02/15/18, tsat 54%.  7. Metabolic bone disease:Corrected Ca ok/Phoshigh.Continue Calcitriol/Sensipar. TakesVelphoroas outpatient binder, changed to fosrenolhere. 8. Nutrition:Albumin still low, continue protein supplements.  Anice Paganini, PA-C 02/24/2018, 9:49 AM  [facility] Pager: [phone]  Pt seen, examined and agree w A/P as above.  Still sig abd pain but improving daily, fevers/ ^WBC better as well.  Cont IV abx. Will get f/u TNC coming off in the morning tomorrow to be sure the infection is clearing.  [facility] 02/24/2018, 2:44 PM

## 2018-02-24 NOTE — Care Management Note (Addendum)
Case Management Note Manya Silvas, RN MSN CCM Transitions of Care [address] [place] [phone]  Patient Details  Name: Patricia [name] MRN: [id number] Date of Birth: 1975/03/29  Subjective/Objective:     Symptomatic anemia             Action/Plan: Spoke with patient at bedside. PTA home with daughter. States she is usually independent with her self care. Her daughter is her care partner for her PD, and gets her hooked up to the cycler every evening.   Patient gets her dialysis medications by mail order through [facility], and she uses [facility]. States that she can drive to appointments or run errands, and her daughter and another person are available to assist as needed.   Discussed MD plan for at least 2 weeks of antibiotics-nephrologist to make arrangements with kidney center to provide antibiotics through her PD.   Patient verbalized understanding of outpatient follow up with her GI doctor.   Noted recent orders for outpatient rehabilitation from patient's PCP-Ashley Long, PA-C. Pt states that she had to cancel initial appointment d/t her health and subsequent hospitalization. Patient is agreeable to working with PT and OT for evaluation for potential needs. Patient states that she has worked with [facility] in the past, and stated that [facility] was very good. Spoke with Dr. Truman Hayward on the telephone who agreed with PT/OT evaluation. CM will continue to follow for transition of care needs.   Expected Discharge Date:  02/22/18               Expected Discharge Plan:  [place]  In-House Referral:  NA  Discharge planning Services  CM Consult  Post Acute Care Choice:  Home Health Choice offered to:  Patient  DME Arranged:  N/A DME Agency:  NA  HH Arranged:  PT HH Agency:  [facility]  Status of Service:  In process, will continue to follow  If discussed at Long Length of Stay Meetings, dates discussed:    Additional Comments: 02/24/2018-HHPT evaluated patient.  Recommending HHPT. Referral made for [facility] per patient choice.  Referral accepted. Patient will need HH order for PT and Face to Face when ready to transition home.   Bartholomew Crews, RN 02/24/2018, 12:57 PM

## 2018-02-24 NOTE — Progress Notes (Signed)
  Date: 02/24/2018  Patient name: Mical Brun  Medical record number: [id number]  Date of birth: 10/04/1975   I have seen and evaluated this patient and I have discussed the plan of care with the house staff. Please see their note for complete details. I concur with their findings with the following additions/corrections:   Still with left-sided abdominal pain today, but overall improving.  Fever curve has improved, white count down to 7.6, spoke with Dr. Melvia Heaps from nephrology today, they will plan to check the cell count of her PD fluid again tomorrow, but the effluent looks clear.  As long as she continues to improve, we agreed no need for further imaging.  Blood loss anemia is stable, no evidence of further blood loss, GI planning balloon enteroscopy as an outpatient.  Lenice Pressman, M.D., Ph.D. 02/24/2018, 3:32 PM

## 2018-02-24 NOTE — Progress Notes (Signed)
Pharmacy Antibiotic Note  Patricia [name] is a 43 y.o. female on Vancomycin and Ceftazidime for peritonitis coverage.  ESRD, on CCPD.  Peritoneal fluid culture NGTD.   Vancomycin level 3 days post loasing dose is 22 mcg/ml. Will redose when level <20 mcg/ml  Plan:  Continue Ceftazidime 1gm IV q24hrs.  No redose of vancomycin at this time.   Will check random Vanc level in 2 days and plan to re-dose when level <20 mcg/ml  Height: $Remove'5\' 7"'HYwQsTO$  (170.2 cm) Weight: 259 lb 7.7 oz (117.7 kg) IBW/kg (Calculated) : 61.6  Temp (24hrs), Avg:98.5 F (36.9 C), Min:98.1 F (36.7 C), Max:98.9 F (37.2 C)  Recent Labs  Lab 02/20/18 0303 02/21/18 0555 02/21/18 1305 02/22/18 0357 02/23/18 0428 02/23/18 1117 02/24/18 0534  WBC 7.4 9.0  --  10.5 9.9  --  7.6  CREATININE 14.54* 14.80* 14.68* 14.17*  --  12.55*  --   VANCORANDOM  --   --   --   --   --   --  22      Allergies  Allergen Reactions  . Nsaids Other (See Comments)    Cannot take due to Kidney disease/Kidney function   . Tolmetin Other (See Comments)    Cannot take due to Kidney Disease    Antimicrobials this admission:  Ceftazidime 2/17>>  Vancomycin 2/17>>  Microbiology results:  2/17 PD fluid - NGTD  Meris Reede A. Levada Dy, PharmD, [place] Please utilize Amion for appropriate phone number to reach the unit pharmacist ([place])   02/24/2018 9:07 AM

## 2018-02-24 NOTE — Care Management Note (Signed)
Case Management Note Manya Silvas, RN MSN CCM Transitions of Care [address] [place] [phone]  Patient Details  Name: Khristi Schiller MRN: [id number] Date of Birth: 06/22/1975  Subjective/Objective:     Symptomatic anemia             Action/Plan: Spoke with patient at bedside. PTA home with daughter. States she is usually independent with her self care. Her daughter is her care partner for her PD, and gets her hooked up to the cycler every evening.   Patient gets her dialysis medications by mail order through [facility], and she uses [facility]. States that she can drive to appointments or run errands, and her daughter and another person are available to assist as needed.   Discussed MD plan for at least 2 weeks of antibiotics-nephrologist to make arrangements with kidney center to provide antibiotics through her PD.   Patient verbalized understanding of outpatient follow up with her GI doctor.   Noted recent orders for outpatient rehabilitation from patient's PCP-Ashley Long, PA-C. Pt states that she had to cancel initial appointment d/t her health and subsequent hospitalization. Patient is agreeable to working with PT and OT for evaluation for potential needs. Patient states that she has worked with [facility] in the past, and stated that [facility] was very good. Spoke with Dr. Truman Hayward on the telephone who agreed with PT/OT evaluation. CM will continue to follow for transition of care needs.   Expected Discharge Date:  02/22/18               Expected Discharge Plan:  [place]  In-House Referral:  NA  Discharge planning Services  CM Consult  Post Acute Care Choice:  Home Health Choice offered to:  Patient  DME Arranged:    DME Agency:  [facility]  HH Arranged:    [place] Agency:  [facility]  Status of Service:  In process, will continue to follow  If discussed at Long Length of Stay Meetings, dates discussed:    Additional Comments:  Bartholomew Crews,  RN 02/24/2018, 10:49 AM

## 2018-02-24 NOTE — Progress Notes (Addendum)
Subjective:  Patricia [name] is a 43 y.o. with PMH of depression, anxiety, morbid obesity, gout, ESRD on PD, Peptic ulcer disease and small bowel AVM admit for melena on hospital day 6  Mrs.Mingle was examined and evaluated at bedside this AM. She was observed lying in bed in acute distress. She states her abdominal pain has been waxing and waning and is very uncomfortable this morning. She states she was able to tolerate some salad and fruits but still continuing to endorse nausea and loss of appetite. She denies any vomiting. No bowel movements since 3 days ago. Denies any fevers or chills.  Objective:  Vital signs in last 24 hours: Vitals:   02/23/18 2045 02/24/18 0500 02/24/18 0900 02/24/18 0959  BP: 106/67 110/70  111/66  Pulse: 84 80  82  Resp: $Remo'20 19  18  'mlgrn$ Temp: 98.4 F (36.9 C) 98.9 F (37.2 C) 97.8 F (36.6 C) 98.6 F (37 C)  TempSrc: Oral Oral  Oral  SpO2: 100% 100%  97%  Weight:    117.5 kg  Height:       Physical Exam  Constitutional: She is oriented to person, place, and time.  Obese, Distressed 2/2 pain  Cardiovascular: Normal rate, normal heart sounds and intact distal pulses.  No murmur heard. Pulmonary/Chest: Effort normal and breath sounds normal. She has no wheezes. She has no rales.  Abdominal: Soft. There is abdominal tenderness (LLQ tenderness to palpation). There is no rebound and no guarding.  Hypoactive bowel sounds  Musculoskeletal: Normal range of motion.        General: No edema.  Neurological: She is alert and oriented to person, place, and time.  Skin: Skin is warm and dry.   Assessment/Plan:  Principal Problem:   Symptomatic anemia Active Problems:   Morbid obesity ([place])   ESRD on peritoneal dialysis ([place])   AVM (arteriovenous malformation) of small bowel, acquired   Peritonitis associated with peritoneal dialysis [facility])  43 yo F w/ PMH of gout, depression, anxiety, morbid obesity, ESRD on peritoneal dialysis 2/2 minimal change  glomerulonephritis, PUD and small bowel AVM admit for melena. Continuing to have significant abdominal pain. Leukocytosis improving and afebrile with stable vitals. Still no growth on fluid culture for 3 days. Nephro would like to re-check fluid cytology for decrease in white count to assess abx response.  Acute onset abdominal pain 2/2 peritonitis Afebrile overnight. WBC 9.0->10.5->9.9->7.6. Dialysis fluid cell count show 2100 WBC with neutrophil predominance. Continuing to have significant abdominal pain although went 9 hrs w/o pain control last night - Appreciate nephro recs: repeat fluid cytology, c/w current IV abx - F/u peritoneal fluid culture (NGTD for 3 days) - C/w ceftazidime, vanc - Trend CBC - May need repeat CT Abd/pelvis if pain continues despite resolution of peritonitis - C/w dilaudid $RemoveBefo'1mg'GIXUsEDeVqo$  q3hrs PRN - Scheduled senokot, miralax for constipation  Melena, Symptomatic Anemia 2/2 bleeding small bowel AVM vs diverticular bleed Negative upper endoscopy on admission. Hgb trend 7.9->8.2->8.6->9.1->8.8 No further melenic stools since admission. Bleeding appears to have resolved. - Appreciate GI recs: Balloon enteroscopy as outpatient on 2/27 - C/w pantoprazole $RemoveBeforeDE'40mg'kXRMLYszOvXnMhP$  BID, Zofran $RemoveBef'4mg'SoORloYGzo$  q6hr PRN - Trend CBC - Transfuse if Hgb<7  ESRD on peritoneal dialysis:  Peritoneal dialysis managed by nephro.  - Appreciate Nephro recs: Mag 1.6 replete w/ oral - Trend renal panel - C/w home meds: cinacalcet $RemoveBeforeDE'30mg'yQHYrUHxQSGKIxq$  qhs, calcitriol 0.58mcg qhs, Rena-Vit daily qhs, hydroxyzine $RemoveBeforeDE'25mg'oqROwcUcVdiTxNU$  q6hr PRN for itching  DVT prophx: SCDs Diet: Regular Bowel: Miralax, Senokot Code:  Full  Dispo: Anticipated discharge in approximately 2-3 day(s).   Mosetta Anis, MD 02/24/2018, 11:28 AM Pager: [phone]

## 2018-02-24 NOTE — Evaluation (Signed)
Physical Therapy Evaluation Patient Details Name: Patricia [name] MRN: [id number] DOB: 1975/09/01 Today's Date: 02/24/2018   History of Present Illness  Pt is a 43 y/o female admitted secondary to increased abdominal pain. Pt thought to have peritonitis at HD site. Pt also with symptomatic anemia s/p EGD on 2/14. PMH includes ESRD on HD and obesity.   Clinical Impression  Pt admitted secondary to problem above with deficits below. Pt requiring min guard to supervision for mobility tasks. Gait distance limited secondary to pain. Pt reaching out for handrails in hall, however, refusing to use AD at this time. Anticipate pt will progress well once pain controlled. Will continue to follow acutely to maximize functional mobility independence and safety.     Follow Up Recommendations Home health PT;Supervision for mobility/OOB    Equipment Recommendations  None recommended by PT    Recommendations for Other Services       Precautions / Restrictions Precautions Precautions: None Restrictions Weight Bearing Restrictions: No      Mobility  Bed Mobility Overal bed mobility: Needs Assistance Bed Mobility: Supine to Sit;Sit to Supine     Supine to sit: Supervision Sit to supine: Supervision   General bed mobility comments: Supervision for safety. Increased time required and reliant on bed rails.   Transfers Overall transfer level: Needs assistance Equipment used: None Transfers: Sit to/from Stand Sit to Stand: Supervision         General transfer comment: Supervision for safety. Increased time required secondary to pain.   Ambulation/Gait Ambulation/Gait assistance: Min guard Gait Distance (Feet): 50 Feet Assistive device: None Gait Pattern/deviations: Step-through pattern;Decreased stride length Gait velocity: Decreased    General Gait Details: Slow, very guarded gait. Pt reaching out for hand rails in hallway. When asked about using AD, pt adamantly refusing. Distance  limited secondary to pain.   Stairs            Wheelchair Mobility    Modified Rankin (Stroke Patients Only)       Balance Overall balance assessment: Needs assistance Sitting-balance support: No upper extremity supported;Feet supported Sitting balance-Leahy Scale: Good     Standing balance support: During functional activity;No upper extremity supported Standing balance-Leahy Scale: Fair                               Pertinent Vitals/Pain Pain Assessment: 0-10 Pain Score: 8  Pain Location: abdomen  Pain Descriptors / Indicators: Constant;Sharp Pain Intervention(s): Limited activity within patient's tolerance;Monitored during session;Repositioned    Home Living Family/patient expects to be discharged to:: Private residence Living Arrangements: Children Available Help at Discharge: Family;Available PRN/intermittently Type of Home: House Home Access: Level entry     Home Layout: Two level;Bed/bath upstairs Home Equipment: None      Prior Function Level of Independence: Independent               Hand Dominance   Dominant Hand: Right    Extremity/Trunk Assessment   Upper Extremity Assessment Upper Extremity Assessment: Defer to OT evaluation    Lower Extremity Assessment Lower Extremity Assessment: Generalized weakness    Cervical / Trunk Assessment Cervical / Trunk Assessment: Normal  Communication   Communication: No difficulties  Cognition Arousal/Alertness: Awake/alert Behavior During Therapy: WFL for tasks assessed/performed Overall Cognitive Status: Within Functional Limits for tasks assessed  General Comments      Exercises     Assessment/Plan    PT Assessment Patient needs continued PT services  PT Problem List Decreased strength;Decreased balance;Decreased mobility;Decreased activity tolerance;Pain;Decreased knowledge of precautions       PT Treatment  Interventions DME instruction;Gait training;Stair training;Functional mobility training;Therapeutic activities;Therapeutic exercise;Balance training;Patient/family education    PT Goals (Current goals can be found in the Care Plan section)  Acute Rehab PT Goals Patient Stated Goal: to decrease pain  PT Goal Formulation: With patient Time For Goal Achievement: 03/24/18 Potential to Achieve Goals: Good    Frequency Min 3X/week   Barriers to discharge        Co-evaluation               AM-PAC PT "6 Clicks" Mobility  Outcome Measure Help needed turning from your back to your side while in a flat bed without using bedrails?: A Little Help needed moving from lying on your back to sitting on the side of a flat bed without using bedrails?: A Little Help needed moving to and from a bed to a chair (including a wheelchair)?: A Little Help needed standing up from a chair using your arms (e.g., wheelchair or bedside chair)?: A Little Help needed to walk in hospital room?: A Little Help needed climbing 3-5 steps with a railing? : A Little 6 Click Score: 18    End of Session Equipment Utilized During Treatment: Gait belt Activity Tolerance: Patient limited by pain Patient left: in bed;with call bell/phone within reach Nurse Communication: Mobility status PT Visit Diagnosis: Muscle weakness (generalized) (M62.81);Other abnormalities of gait and mobility (R26.89);Pain Pain - part of body: (abdomen )    Time: 3734-2876 PT Time Calculation (min) (ACUTE ONLY): 16 min   Charges:   PT Evaluation $PT Eval Moderate Complexity: 1 Mod          Leighton Ruff, PT, DPT  Acute Rehabilitation Services  Pager: [phone] Office: [phone]   Rudean Hitt 02/24/2018, 12:30 PM

## 2018-02-25 LAB — CBC WITH DIFFERENTIAL/PLATELET
Abs Immature Granulocytes: 0.3 10*3/uL — ABNORMAL HIGH (ref 0.00–0.07)
BASOS PCT: 1 %
Basophils Absolute: 0.1 10*3/uL (ref 0.0–0.1)
EOS ABS: 0.1 10*3/uL (ref 0.0–0.5)
Eosinophils Relative: 1 %
HCT: 22.4 % — ABNORMAL LOW (ref 36.0–46.0)
Hemoglobin: 6.9 g/dL — CL (ref 12.0–15.0)
Lymphocytes Relative: 12 %
Lymphs Abs: 0.8 10*3/uL (ref 0.7–4.0)
MCH: 28.8 pg (ref 26.0–34.0)
MCHC: 30.8 g/dL (ref 30.0–36.0)
MCV: 93.3 fL (ref 80.0–100.0)
Metamyelocytes Relative: 2 %
Monocytes Absolute: 0 10*3/uL — ABNORMAL LOW (ref 0.1–1.0)
Monocytes Relative: 0 %
Myelocytes: 2 %
NEUTROS ABS: 5.4 10*3/uL (ref 1.7–7.7)
NEUTROS PCT: 82 %
NRBC: 1 /100{WBCs} — AB
Platelets: 235 10*3/uL (ref 150–400)
RBC: 2.4 MIL/uL — ABNORMAL LOW (ref 3.87–5.11)
RDW: 16.4 % — ABNORMAL HIGH (ref 11.5–15.5)
WBC: 6.6 10*3/uL (ref 4.0–10.5)
nRBC: 0.8 % — ABNORMAL HIGH (ref 0.0–0.2)

## 2018-02-25 LAB — RENAL FUNCTION PANEL
Albumin: 2.5 g/dL — ABNORMAL LOW (ref 3.5–5.0)
Anion gap: 11 (ref 5–15)
BUN: 34 mg/dL — ABNORMAL HIGH (ref 6–20)
CO2: 25 mmol/L (ref 22–32)
Calcium: 8 mg/dL — ABNORMAL LOW (ref 8.9–10.3)
Chloride: 101 mmol/L (ref 98–111)
Creatinine, Ser: 11.9 mg/dL — ABNORMAL HIGH (ref 0.44–1.00)
GFR calc Af Amer: 4 mL/min — ABNORMAL LOW (ref >60)
GFR calc non Af Amer: 3 mL/min — ABNORMAL LOW (ref >60)
GLUCOSE: 129 mg/dL — AB (ref 70–99)
PHOSPHORUS: 4.5 mg/dL (ref 2.5–4.6)
Potassium: 3.1 mmol/L — ABNORMAL LOW (ref 3.5–5.1)
Sodium: 137 mmol/L (ref 135–145)

## 2018-02-25 LAB — HEMOGLOBIN AND HEMATOCRIT, BLOOD
HCT: 27 % — ABNORMAL LOW (ref 36.0–46.0)
Hemoglobin: 8.3 g/dL — ABNORMAL LOW (ref 12.0–15.0)

## 2018-02-25 LAB — BODY FLUID CELL COUNT WITH DIFFERENTIAL
Eos, Fluid: 22 %
Lymphs, Fluid: 46 %
Monocyte-Macrophage-Serous Fluid: 17 % — ABNORMAL LOW (ref 50–90)
Neutrophil Count, Fluid: 15 % (ref 0–25)
Total Nucleated Cell Count, Fluid: 16 cu mm (ref 0–1000)

## 2018-02-25 LAB — SAVE SMEAR(SSMR), FOR PROVIDER SLIDE REVIEW

## 2018-02-25 LAB — BODY FLUID CULTURE: Culture: NO GROWTH

## 2018-02-25 LAB — PREPARE RBC (CROSSMATCH)

## 2018-02-25 LAB — VANCOMYCIN, RANDOM: Vancomycin Rm: 16

## 2018-02-25 LAB — LACTATE DEHYDROGENASE: LDH: 185 U/L (ref 98–192)

## 2018-02-25 LAB — MAGNESIUM: Magnesium: 1.7 mg/dL (ref 1.7–2.4)

## 2018-02-25 MED ORDER — SODIUM CHLORIDE 0.9% IV SOLUTION: Freq: Once | INTRAVENOUS | Status: DC

## 2018-02-25 MED ORDER — POLYETHYLENE GLYCOL 3350 17 G PO PACK: 17.0000 g | PACK | Freq: Every day | ORAL | Status: DC

## 2018-02-25 MED ORDER — MAGNESIUM SULFATE 2 GM/50ML IV SOLN
2.0000 g | Freq: Once | INTRAVENOUS | Status: AC
  Administered 2018-02-25: 2 g via INTRAVENOUS
  Filled 2018-02-25: qty 50

## 2018-02-25 MED ORDER — VANCOMYCIN HCL IN DEXTROSE 1-5 GM/200ML-% IV SOLN
1000.0000 mg | Freq: Once | INTRAVENOUS | Status: AC
  Administered 2018-02-25: 1000 mg via INTRAVENOUS
  Filled 2018-02-25: qty 200

## 2018-02-25 MED ORDER — POLYETHYLENE GLYCOL 3350 17 G PO PACK: 17.0000 g | PACK | Freq: Every day | ORAL | Status: DC | PRN

## 2018-02-25 NOTE — Progress Notes (Addendum)
  Date: 02/25/2018  Patient name: Mallerie Blok  Medical record number: [id number]  Date of birth: 24-Jan-1975   I have seen and evaluated this patient and I have discussed the plan of care with the house staff. Please see their note for complete details. I concur with their findings with the following additions/corrections:   Abdominal pain has improved this morning.  She had a bowel movement overnight, which may have helped.  She also received multiple doses of pain medication.  Overall, her peritonitis appears to be improving.  Repeat cell count today has decreased significantly.  We will continue IV antibiotics until she is ready for discharge.  Unfortunately, her hemoglobin has dropped again this morning to 6.9.  Surprisingly, schistocytes were noted on the differential.  Will obtain a smear from the lab and try to review it with Dr. Beryle Beams.  We will also check an LDH to evaluate for hemolysis.  She does not seem to have any other signs of microangiopathic hemolytic anemia, as her platelets are normal.  With her next lab draw, we will check coags as well.  She has not noted any hematochezia or melena.  We discussed the drop in hemoglobin with GI, who recommended continuing with the previous plan to pursue outpatient balloon enteroscopy.  We will transfuse her 1 unit PRBCs today and watch her hemoglobin closely.  Lenice Pressman, M.D., Ph.D. 02/25/2018, 2:42 PM   UPDATE: Reviewed smear with Dr. Beryle Beams, only a few schistocytes identified, predominant findings are spherocytes, tear drop cells, and polychromasia, platelet count confirmed normal. On labs, LDH is normal. Very low concern for hemolysis based on these findings, more likely related to recurrent blood loss, will check H/H after transfusion to ensure adequate response. Discussed with GI, plan to continue course for now, will call back if bleeding worsens.

## 2018-02-25 NOTE — Evaluation (Signed)
Occupational Therapy Evaluation Patient Details Name: Patricia [name] MRN: [id number] DOB: [birth year] Today's Date: 02/25/2018    History of Present Illness Pt is a 43 y/o female admitted secondary to increased abdominal pain. Pt thought to have peritonitis at HD site. Pt also with symptomatic anemia s/p EGD on 2/14. PMH includes ESRD on HD and obesity.    Clinical Impression   Pt is at sup level with mobility, set up/sup with ADLs, some min A with LB ADLs. Pt's will have prn assist from her daughter at home. All education completed and no further acute OT is indicated at this time    Follow Up Recommendations  No OT follow up;Supervision - Intermittent    Equipment Recommendations  None recommended by OT    Recommendations for Other Services       Precautions / Restrictions Precautions Precautions: None Restrictions Weight Bearing Restrictions: No      Mobility Bed Mobility Overal bed mobility: Modified Independent Bed Mobility: Supine to Sit     Supine to sit: Modified independent (Device/Increase time)     General bed mobility comments: pt in recliner upon arrival  Transfers Overall transfer level: Needs assistance Equipment used: None Transfers: Sit to/from Stand Sit to Stand: Supervision         General transfer comment: Supervision for safety. Increased time required secondary to pain.     Balance Overall balance assessment: Needs assistance Sitting-balance support: No upper extremity supported;Feet supported Sitting balance-Leahy Scale: Good     Standing balance support: During functional activity;No upper extremity supported Standing balance-Leahy Scale: Fair                             ADL either performed or assessed with clinical judgement   ADL                                         General ADL Comments: sup wiht set up due to abdominal pain, min A with LB bathing and dressing     Vision Patient Visual  Report: No change from baseline       Perception     Praxis      Pertinent Vitals/Pain Pain Assessment: 0-10 Pain Score: 7  Pain Location: abdomen  Pain Descriptors / Indicators: Constant;Discomfort Pain Intervention(s): Limited activity within patient's tolerance;Monitored during session;Repositioned     Hand Dominance Right   Extremity/Trunk Assessment Upper Extremity Assessment Upper Extremity Assessment: Overall WFL for tasks assessed   Lower Extremity Assessment Lower Extremity Assessment: Defer to PT evaluation   Cervical / Trunk Assessment Cervical / Trunk Assessment: Normal   Communication Communication Communication: No difficulties   Cognition Arousal/Alertness: Awake/alert Behavior During Therapy: WFL for tasks assessed/performed Overall Cognitive Status: Within Functional Limits for tasks assessed                                     General Comments  pt said pain more than it was yesterday.    Exercises     Shoulder Instructions      Home Living Family/patient expects to be discharged to:: Private residence Living Arrangements: Children Available Help at Discharge: Family;Available PRN/intermittently Type of Home: House Home Access: Level entry     Home Layout: Two level;Bed/bath upstairs Alternate Level Stairs-Number of Steps:  13 Alternate Level Stairs-Rails: Right;Left Bathroom Shower/Tub: Teacher, early years/pre: Standard     Home Equipment: None          Prior Functioning/Environment Level of Independence: Independent                 OT Problem List: Decreased activity tolerance;Pain      OT Treatment/Interventions:      OT Goals(Current goals can be found in the care plan section) Acute Rehab OT Goals Patient Stated Goal: to decrease pain  OT Goal Formulation: With patient  OT Frequency:     Barriers to D/C:    no barriers, pt's daughter can assist at home prn       Co-evaluation               AM-PAC OT "6 Clicks" Daily Activity     Outcome Measure Help from another person eating meals?: None Help from another person taking care of personal grooming?: None Help from another person toileting, which includes using toliet, bedpan, or urinal?: None Help from another person bathing (including washing, rinsing, drying)?: A Little Help from another person to put on and taking off regular upper body clothing?: None Help from another person to put on and taking off regular lower body clothing?: A Little 6 Click Score: 22   End of Session    Activity Tolerance: Patient limited by pain Patient left: in chair  OT Visit Diagnosis: Pain;Other abnormalities of gait and mobility (R26.89);Muscle weakness (generalized) (M62.81) Pain - part of body: (abdomen)                Time: 2956-2130 OT Time Calculation (min): 22 min Charges:  OT General Charges $OT Visit: 1 Visit OT Treatments $Self Care/Home Management : 8-22 mins    Britt Bottom 02/25/2018, 3:21 PM

## 2018-02-25 NOTE — Progress Notes (Addendum)
[facility] Progress Note   Subjective: Still C/O abdominal pain but says its improving. Denies fevers during night. Feeling better. Denies dizziness, sx of orthostatic hypotension.   Objective Vitals:   02/24/18 1941 02/24/18 2010 02/25/18 0501 02/25/18 0857  BP: (!) 147/77 125/70 93/62 (!) 100/52  Pulse: 88 84 80 93  Resp: 18 20 (!) 22 19  Temp: 99 F (37.2 C) 98.6 F (37 C) 98 F (36.7 C) 98.2 F (36.8 C)  TempSrc: Oral Oral Oral Oral  SpO2: 98% 99% 96% 97%  Weight: 118.2 kg   119 kg  Height:       Physical Exam General: Obese female in NAD Heart: S1,S2 RRR Lungs: CTAB A/P Abdomen: active BS. PD cath LUQ Drsg intact Extremities: Bilateral pedal edema Dialysis Access: PD cath intact.    Additional Objective Labs: Basic Metabolic Panel: Recent Labs  Lab 02/21/18 1305 02/22/18 0357 02/23/18 1117  NA 135 132* 135  K 3.5 3.2* 3.7  CL 94* 93* 96*  CO2 22 20* 20*  GLUCOSE 128* 130* 139*  BUN 51* 47* 37*  CREATININE 14.68* 14.17* 12.55*  CALCIUM 8.1* 8.0* 8.3*  PHOS 6.3* 6.5* 6.3*   Liver Function Tests: Recent Labs  Lab 02/18/18 1301  02/21/18 1305 02/22/18 0357 02/23/18 1117  AST 33  --   --   --   --   ALT 27  --   --   --   --   ALKPHOS 42  --   --   --   --   BILITOT 0.5  --   --   --   --   PROT 6.9  --   --   --   --   ALBUMIN 3.4*   < > 3.3* 3.0* 2.9*   < > = values in this interval not displayed.   No results for input(s): LIPASE, AMYLASE in the last 168 hours. CBC: Recent Labs  Lab 02/20/18 0303 02/21/18 0555 02/22/18 0357 02/23/18 0428 02/24/18 0534  WBC 7.4 9.0 10.5 9.9 7.6  NEUTROABS  --   --  7.8*  --   --   HGB 8.2* 8.5* 8.6* 9.1* 8.8*  HCT 25.9* 27.5* 26.4* 28.8* 27.3*  MCV 91.5 91.1 89.5 91.7 91.9  PLT 281 286 299 310 284   Blood Culture    Component Value Date/Time   SDES PERITONEAL DIALYSATE 02/21/2018 2230   SPECREQUEST NONE 02/21/2018 2230   CULT NO GROWTH 02/21/2018 2230   REPTSTATUS 02/25/2018  FINAL 02/21/2018 2230    Cardiac Enzymes: No results for input(s): CKTOTAL, CKMB, CKMBINDEX, TROPONINI in the last 168 hours. CBG: No results for input(s): GLUCAP in the last 168 hours. Iron Studies: No results for input(s): IRON, TIBC, TRANSFERRIN, FERRITIN in the last 72 hours. $RemoveB'@lablastinr3'JQsKzgKC$ @ Studies/Results: No results found. Medications: . cefTAZidime (FORTAZ)  IV 1 g (02/24/18 1247)  . dialysis solution 1.5% low-MG/low-CA     . allopurinol  100 mg Oral QHS  . atorvastatin  40 mg Oral QHS  . calcitRIOL  0.5 mcg Oral QHS  . cinacalcet  30 mg Oral Q supper  . gentamicin cream  1 application Topical Daily  . lanthanum  1,000 mg Oral TID WC  . metoprolol tartrate  25 mg Oral QHS  . multivitamin  1 tablet Oral QHS  . pantoprazole  40 mg Oral BID  . ramelteon  8 mg Oral QHS  . senna  2 tablet Oral Daily  . sodium chloride flush  3  mL Intravenous Q12H  . vancomycin variable dose per unstable renal function (pharmacist dosing)   Does not apply See admin instructions  . venlafaxine XR  75 mg Oral QHS     Dialysis Orders:  CCPD 7x/week 5 exchanges Fill vol 3000 ml Dwell Time 2 hours. 1 day exchange fill volume 3000L Day pause 4 hours.   Outpatient Hgb: 10.9 (01/14/18), 9.0 (02/09/18)   Assessment/Plan: 1. Symptomatic anemia/melena:Followed by [facility]. Multiplepast procedures have been unrevealing.S/p EGD2/14no bleedinglesions seen.GI following, plan for balloon enteroscopy outpatient. 2. PD cath-relatedperitonitis: Abdominal pain and effluent appear improved. Afebrile.Cell countCx 2100, 88% neutrophils. Gram stain/Cx no growth x2 days, but was given Vanc/Ceftaz prior to this collection.Last GI procedure 2/15 -abd CT completed to r/o perforation and essentially negative.Will defer to primary team for pain control, IV abx per pharmacy (can change to IP on d/c).  Cell count is down to 21, IV abx working. Will switch to IP abx when pt dc'd home.   3. ESRD: Continue CCPD  while here. Blood pressures improved, will continue PD gluc Change to1.5% X 3 exchanges and 2.5% X 3 exchanges tonight 4. Hypokalemia/hypomagnesemia: K improved to 3.7 after 2 days of KCl, will d/c KCl and follow. Magnesium still low at 1.6, Mag Oxide has been reordered. Follow levels. Mg levels sig low, will give IV MgSO4 today 5. Hypertension/volume:BP controlled, home lisinopril on hold. No edema. 6. Anemia: Hgblow/stable- 8.8. LastMircera 225 dosed 02/15/18, tsat 54%.  7. Metabolic bone disease:CorrectedCa ok/Phoshigh.Continue Calcitriol/Sensipar. TakesVelphoroas outpatient binder, changed to fosrenolhere. 8    Nutrition:Albumin still low, continue protein supplementsNutrition - Albumin 2.9 Renal diet. Prostat, renal vits  [name] NP-C 02/25/2018, 11:08 AM  [facility] [phone]  Pt seen, examined and agree w assess/plan as above with additions as indicated.  [facility] 02/25/2018, 11:39 AM

## 2018-02-25 NOTE — Progress Notes (Signed)
Physical Therapy Treatment Patient Details Name: Patricia [name] MRN: [id number] DOB: [birth year] Today's Date: 02/25/2018    History of Present Illness Pt is a 43 y/o female admitted secondary to increased abdominal pain. Pt thought to have peritonitis at HD site. Pt also with symptomatic anemia s/p EGD on 2/14. PMH includes ESRD on HD and obesity.     PT Comments    Pt increased her walking today.  Her abdominal pain is still high 8/10 -she said it is more than yesterday. It did not increase or decrease with activity.  Pt steady with walking.  I do not feel she needs skilled PT at DC but needs some assist as needed.  I talked to her about increasing her activity level while in the hospital - walking in halls with nursing.   Follow Up Recommendations  Supervision for mobility/OOB;No PT follow up     Equipment Recommendations  None recommended by PT    Recommendations for Other Services       Precautions / Restrictions Precautions Precautions: None Restrictions Weight Bearing Restrictions: No    Mobility  Bed Mobility Overal bed mobility: Modified Independent Bed Mobility: Supine to Sit     Supine to sit: Modified independent (Device/Increase time)     General bed mobility comments: pt used bed rails but had good technque getting up - went slow due to abdominal pain  Transfers Overall transfer level: Needs assistance Equipment used: None Transfers: Sit to/from Stand Sit to Stand: Supervision         General transfer comment: Supervision for safety. Increased time required secondary to pain.   Ambulation/Gait Ambulation/Gait assistance: Min guard Gait Distance (Feet): 200 Feet Assistive device: None Gait Pattern/deviations: Step-through pattern;Decreased stride length     General Gait Details: pt walked slowly but she was very steady.  no increase or decrease of pain iwht mobilty.  pt didnt want to use RW and I agree she was steady and didnt need  it.   Stairs             Wheelchair Mobility    Modified Rankin (Stroke Patients Only)       Balance                                            Cognition Arousal/Alertness: Awake/alert Behavior During Therapy: WFL for tasks assessed/performed Overall Cognitive Status: Within Functional Limits for tasks assessed                                        Exercises      General Comments General comments (skin integrity, edema, etc.): pt said pain more than it was yesterday.      Pertinent Vitals/Pain Pain Assessment: 0-10 Pain Score: 8  Pain Location: abdomen  Pain Descriptors / Indicators: Constant;Sharp Pain Intervention(s): Monitored during session;Repositioned    Home Living                      Prior Function            PT Goals (current goals can now be found in the care plan section) Progress towards PT goals: Progressing toward goals    Frequency    Min 3X/week      PT Plan Current plan remains appropriate  Co-evaluation              AM-PAC PT "6 Clicks" Mobility   Outcome Measure  Help needed turning from your back to your side while in a flat bed without using bedrails?: None Help needed moving from lying on your back to sitting on the side of a flat bed without using bedrails?: None   Help needed standing up from a chair using your arms (e.g., wheelchair or bedside chair)?: A Little Help needed to walk in hospital room?: A Little Help needed climbing 3-5 steps with a railing? : A Lot 6 Click Score: 16    End of Session   Activity Tolerance: Patient limited by pain Patient left: in bed;with call bell/phone within reach Nurse Communication: Mobility status PT Visit Diagnosis: Muscle weakness (generalized) (M62.81);Other abnormalities of gait and mobility (R26.89);Pain Pain - part of body: (abdomen)     Time: 6681-5947 PT Time Calculation (min) (ACUTE ONLY): 20 min  Charges:   $Gait Training: 8-22 mins                     02/25/2018   Rande Lawman, PT    Loyal Buba 02/25/2018, 12:45 PM

## 2018-02-25 NOTE — Progress Notes (Signed)
Pharmacy Antibiotic Note  Patricia [name] is a 43 y.o. female on Vancomycin and Ceftazidime for peritonitis coverage.  ESRD, on CCPD.  Peritoneal fluid culture NGTD.   Vancomycin level 4 days post loasing dose is 16 mcg/ml. Will redose 1gm IV x 1  Plan: Continue Ceftazidime 1gm IV q24hrs. Redose Vancomycin 1gm IV x 1.    Height: $Remove'5\' 7"'dlogGRs$  (170.2 cm) Weight: 262 lb 5.6 oz (119 kg) IBW/kg (Calculated) : 61.6  Temp (24hrs), Avg:98.6 F (37 C), Min:98 F (36.7 C), Max:99.2 F (37.3 C)  Recent Labs  Lab 02/20/18 0303 02/21/18 0555 02/21/18 1305 02/22/18 0357 02/23/18 0428 02/23/18 1117 02/24/18 0534 02/25/18 1025  WBC 7.4 9.0  --  10.5 9.9  --  7.6 6.6  CREATININE 14.54* 14.80* 14.68* 14.17*  --  12.55*  --   --   VANCORANDOM  --   --   --   --   --   --  22 16      Allergies  Allergen Reactions  . Nsaids Other (See Comments)    Cannot take due to Kidney disease/Kidney function   . Tolmetin Other (See Comments)    Cannot take due to Kidney Disease    Antimicrobials this admission:  Ceftazidime 2/17>>  Vancomycin 2/17>>  Microbiology results:  2/17 PD fluid - NGTD  Patricia [name] A. Levada Dy, PharmD, [place] Please utilize Amion for appropriate phone number to reach the unit pharmacist ([facility])   02/25/2018 12:07 PM

## 2018-02-25 NOTE — Progress Notes (Signed)
Subjective:  Patricia [name] is a 43 y.o. with PMH of depression, anxiety, morbid obesity, gout, ESRD on PD, Peptic ulcer disease and small bowel AVM admit for melena on hospital day 7  Patricia [name] was examined and evaluated at bedside this AM. She was observed resting comfortable in bed with peritoneal dialysis ongoing. She states she had a bowel movement last night with normal brown appearance and her pain has been improving. She also mentions she participated in walking with physical therapy and that appears to have improved the pain as well. She has no acute complaint at this time and is hoping that she will be discharged soon once the pain subsides. Discussed the plan to re-check her peritoneal fluid today to assess abx efficacy. She expressed understanding.  Objective:  Vital signs in last 24 hours: Vitals:   02/24/18 1941 02/24/18 2010 02/25/18 0501 02/25/18 0857  BP: (!) 147/77 125/70 93/62 (!) 100/52  Pulse: 88 84 80 93  Resp: 18 20 (!) 22 19  Temp: 99 F (37.2 C) 98.6 F (37 C) 98 F (36.7 C) 98.2 F (36.8 C)  TempSrc: Oral Oral Oral Oral  SpO2: 98% 99% 96% 97%  Weight: 118.2 kg   119 kg  Height:       Physical Exam  Constitutional: She is oriented to person, place, and time and well-developed, well-nourished, and in no distress.  Cardiovascular: Normal rate, normal heart sounds and intact distal pulses.  No murmur heard. Pulmonary/Chest: Effort normal and breath sounds normal. She has no wheezes. She has no rales.  Abdominal: Soft. Bowel sounds are normal. There is abdominal tenderness (LLQ tenderness to palpation, improved compared to yesterday). There is no rebound and no guarding.  Musculoskeletal: Normal range of motion.        General: No edema.  Neurological: She is alert and oriented to person, place, and time.  Skin: Skin is warm and dry.   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 Her abdominal pain appears to be slowly improving. However, her labs show significant drop in hemoglobin. Will transfuse and monitor.  Acute onset abdominal pain 2/2 peritonitis Afebrile overnight. WBC 10.5->9.9->7.6->6.6. Dialysis fluid cell count show 2100 WBC ->16 WBC after 4 days of IV abx. Subjective improvement in pain. - Appreciate nephro recs - C/w ceftazidime, vanc - Trend CBC - C/w dilaudid $RemoveBefo'1mg'hQGFPdCESEF$  q3hrs PRN - Scheduled senokot, miralax for constipation  Melena, Symptomatic Anemia 2/2 bleeding small bowel AVM vs diverticular bleed Negative upper endoscopy on admission. Hgb trend 8.6->9.1->8.8->6.9. Recent bowel movement without obvious melena per patient report, but sudden drop in hemoglobin concerning. Will contact GI for further recs - Appreciate GI recs: Keep stable for now. Continue plan for outpatient enteroscopy on 2/27 - Type and Screen, 1 unit pRBC now - C/w pantoprazole $RemoveBeforeDE'40mg'yCtXflOfsRoXyHy$  BID, Zofran $RemoveBef'4mg'vHmXFwHcmy$  q6hr PRN - Trend CBC - Transfuse if Hgb<7  ESRD on peritoneal dialysis:  Peritoneal dialysis managed by nephro. Mag now 1.7 - Appreciate Nephro recs - Trend renal panel - C/w home meds: cinacalcet $RemoveBeforeDE'30mg'KAoxmrAPNewjZDG$  qhs, calcitriol 0.27mcg qhs, Rena-Vit daily qhs, hydroxyzine $RemoveBeforeDE'25mg'OEAhHdUqNNSLVdh$  q6hr PRN for itching  DVT prophx: SCDs Diet: Regular Bowel: Miralax,  Senokot Code: Full  Dispo: Anticipated discharge in approximately 2-3 day(s).   Mosetta Anis, MD 02/25/2018, 12:30 PM Pager: [phone]

## 2018-02-26 LAB — PROTIME-INR
INR: 1.07
Prothrombin Time: 13.8 seconds (ref 11.4–15.2)

## 2018-02-26 LAB — CBC WITH DIFFERENTIAL/PLATELET
Abs Immature Granulocytes: 0.21 10*3/uL — ABNORMAL HIGH (ref 0.00–0.07)
Basophils Absolute: 0 10*3/uL (ref 0.0–0.1)
Basophils Relative: 0 %
Eosinophils Absolute: 0.2 10*3/uL (ref 0.0–0.5)
Eosinophils Relative: 3 %
HCT: 24.9 % — ABNORMAL LOW (ref 36.0–46.0)
Hemoglobin: 7.7 g/dL — ABNORMAL LOW (ref 12.0–15.0)
Immature Granulocytes: 3 %
Lymphocytes Relative: 23 %
Lymphs Abs: 1.7 10*3/uL (ref 0.7–4.0)
MCH: 28 pg (ref 26.0–34.0)
MCHC: 30.9 g/dL (ref 30.0–36.0)
MCV: 90.5 fL (ref 80.0–100.0)
Monocytes Absolute: 0.6 10*3/uL (ref 0.1–1.0)
Monocytes Relative: 8 %
Neutro Abs: 4.6 10*3/uL (ref 1.7–7.7)
Neutrophils Relative %: 63 %
Platelets: 224 K/uL (ref 150–400)
RBC: 2.75 MIL/uL — ABNORMAL LOW (ref 3.87–5.11)
RDW: 17.1 % — ABNORMAL HIGH (ref 11.5–15.5)
WBC: 7.3 10*3/uL (ref 4.0–10.5)
nRBC: 1.1 % — ABNORMAL HIGH (ref 0.0–0.2)

## 2018-02-26 LAB — RENAL FUNCTION PANEL
Albumin: 2.6 g/dL — ABNORMAL LOW (ref 3.5–5.0)
Anion gap: 15 (ref 5–15)
BUN: 33 mg/dL — ABNORMAL HIGH (ref 6–20)
CO2: 25 mmol/L (ref 22–32)
Calcium: 8.1 mg/dL — ABNORMAL LOW (ref 8.9–10.3)
Chloride: 95 mmol/L — ABNORMAL LOW (ref 98–111)
Creatinine, Ser: 11.81 mg/dL — ABNORMAL HIGH (ref 0.44–1.00)
GFR calc Af Amer: 4 mL/min — ABNORMAL LOW (ref >60)
GFR calc non Af Amer: 3 mL/min — ABNORMAL LOW (ref >60)
Glucose, Bld: 116 mg/dL — ABNORMAL HIGH (ref 70–99)
Phosphorus: 5 mg/dL — ABNORMAL HIGH (ref 2.5–4.6)
Potassium: 3.2 mmol/L — ABNORMAL LOW (ref 3.5–5.1)
Sodium: 135 mmol/L (ref 135–145)

## 2018-02-26 LAB — PREPARE RBC (CROSSMATCH)

## 2018-02-26 LAB — HEMOGLOBIN AND HEMATOCRIT, BLOOD
HCT: 32 % — ABNORMAL LOW (ref 36.0–46.0)
Hemoglobin: 10.1 g/dL — ABNORMAL LOW (ref 12.0–15.0)

## 2018-02-26 LAB — APTT: aPTT: 30 seconds (ref 24–36)

## 2018-02-26 LAB — FIBRINOGEN: Fibrinogen: 740 mg/dL — ABNORMAL HIGH (ref 210–475)

## 2018-02-26 LAB — MAGNESIUM: Magnesium: 2.3 mg/dL (ref 1.7–2.4)

## 2018-02-26 MED ORDER — POTASSIUM CHLORIDE CRYS ER 20 MEQ PO TBCR
40.0000 meq | EXTENDED_RELEASE_TABLET | Freq: Two times a day (BID) | ORAL | Status: AC
  Administered 2018-02-26 (×2): 40 meq via ORAL
  Filled 2018-02-26 (×2): qty 2

## 2018-02-26 MED ORDER — SODIUM CHLORIDE 0.9% IV SOLUTION: Freq: Once | INTRAVENOUS | Status: DC

## 2018-02-26 NOTE — Progress Notes (Signed)
   Subjective:  Patricia [name] is a 43 y.o. with PMH of depression, anxiety, morbid obesity, gout, ESRD on PD, Peptic ulcer disease and small bowel AVM admit for melena on hospital day 8  Mrs.Shorkey was seen on rounds this morning. She states she is feeling better today and has mild-moderate pain when she gets up and moves around. She continues to have some fatigue. She has used less pain medication in the last day. She denies BM since last night and was reminded she has Miralax if needed and she states she would like to try to go first.  Objective:  Vital signs in last 24 hours: Vitals:   02/26/18 0424 02/26/18 0904 02/26/18 1221 02/26/18 1236  BP: 116/72 127/66 121/66 115/74  Pulse: 78 77 76 83  Resp: $Remo'18 18 18 18  'hPEBe$ Temp: 98 F (36.7 C) 98.2 F (36.8 C) 97.7 F (36.5 C) 97.9 F (36.6 C)  TempSrc: Oral Oral Oral Oral  SpO2: 97% 97% 100% 98%  Weight:  119.7 kg    Height:       Physical Exam  Constitutional: She is well-developed, well-nourished, and in no distress.  Cardiovascular: Normal rate, normal heart sounds and intact distal pulses.  No murmur heard. Pulmonary/Chest: Effort normal and breath sounds normal. She has no wheezes. She has no rales.  Abdominal: Soft. Bowel sounds are normal. There is abdominal tenderness (RUQ and LUQ tender to palpation, continues to improve). There is no rebound and no guarding.  Musculoskeletal: Normal range of motion.        General: No edema.  Neurological: She is alert.  Skin: Skin is warm and dry.   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 Her abdominal pain  appears  Improving and hemoglobin appears to have stabilized at 7.7  Acute onset abdominal pain 2/2 peritonitis Afebrile overnight. WBC 10.5->9.9->7.6->6.6. Dialysis fluid cell count show 2100 WBC ->16 WBC after 4 days of IV abx. Subjective improvement in pain. - Appreciate nephro recs - C/w ceftazidime, vanc - Trend CBC - C/w dilaudid $RemoveBefo'1mg'LQBrApKOjSK$  q3hrs PRN - Scheduled senokot, PRN miralax for constipation  Melena, Symptomatic Anemia 2/2 bleeding small bowel AVM vs diverticular bleed Negative upper endoscopy on admission. Hgb trend 8.6->9.1->8.8->6.9. Recent BM 2/21 without obvious melena. GI contacted for further recs - Appreciate GI recs: Keep stable for now. Continue plan for outpatient enteroscopy on 2/27 - Nephrology has ordered repeat transfusion of 2U - C/w pantoprazole $RemoveBeforeDE'40mg'OWqvRkocazgBBVA$  BID, Zofran $RemoveBef'4mg'AreljYKhHd$  q6hr PRN - Trend CBC  ESRD on peritoneal dialysis:  Peritoneal dialysis managed by nephro. Mag now 1.7 - Appreciate Nephro recs - Trend renal panel - C/w home meds: cinacalcet $RemoveBeforeDE'30mg'jocNemGtUflGoZV$  qhs, calcitriol 0.30mcg qhs, Rena-Vit daily qhs, hydroxyzine $RemoveBeforeDE'25mg'gWojUUUAZxfpgER$  q6hr PRN for itching  DVT prophx: SCDs Diet: Regular Bowel: Miralax, Senokot Code: Full  Dispo: Anticipated discharge in approximately 1-2 day(s).   [name], MD 02/26/2018, 2:28 PM Pager: [phone]

## 2018-02-26 NOTE — Progress Notes (Addendum)
[facility] Progress Note   Subjective: Still C/O dizziness when she sits up, feels weak. Decreased abdominal tenderness today. Afebrile overnight.No issues with PD overnight.   Objective Vitals:   02/25/18 1757 02/25/18 2151 02/26/18 0424 02/26/18 0904  BP: 122/63 127/60 116/72 127/66  Pulse: 87 86 78 77  Resp: $Remo'18 19 18 18  'kpUyk$ Temp: 98.4 F (36.9 C) 98 F (36.7 C) 98 F (36.7 C) 98.2 F (36.8 C)  TempSrc: Oral Oral Oral Oral  SpO2: 100% 100% 97% 97%  Weight: 122.8 kg   119.7 kg  Height:       Physical Exam General: Obese female in NAD  Heart: S1,S2, RRR  Lungs: CTAB A/P Abdomen: Soft, minimal tenderness upper quads.  Extremities: trace pedal edema Dialysis Access: PD cath LUQ Drsg CDI. No drainage at exit site.   Additional Objective Labs: Basic Metabolic Panel: Recent Labs  Lab 02/23/18 1117 02/25/18 1025 02/26/18 0401  NA 135 137 135  K 3.7 3.1* 3.2*  CL 96* 101 95*  CO2 20* 25 25  GLUCOSE 139* 129* 116*  BUN 37* 34* 33*  CREATININE 12.55* 11.90* 11.81*  CALCIUM 8.3* 8.0* 8.1*  PHOS 6.3* 4.5 5.0*   Liver Function Tests: Recent Labs  Lab 02/23/18 1117 02/25/18 1025 02/26/18 0401  ALBUMIN 2.9* 2.5* 2.6*   No results for input(s): LIPASE, AMYLASE in the last 168 hours. CBC: Recent Labs  Lab 02/22/18 0357 02/23/18 0428 02/24/18 0534 02/25/18 1025 02/25/18 2013 02/26/18 0401  WBC 10.5 9.9 7.6 6.6  --  7.3  NEUTROABS 7.8*  --   --  5.4  --  4.6  HGB 8.6* 9.1* 8.8* 6.9* 8.3* 7.7*  HCT 26.4* 28.8* 27.3* 22.4* 27.0* 24.9*  MCV 89.5 91.7 91.9 93.3  --  90.5  PLT 299 310 284 235  --  224   Blood Culture    Component Value Date/Time   SDES PERITONEAL DIALYSATE 02/21/2018 2230   SPECREQUEST NONE 02/21/2018 2230   CULT NO GROWTH 02/21/2018 2230   REPTSTATUS 02/25/2018 FINAL 02/21/2018 2230    Cardiac Enzymes: No results for input(s): CKTOTAL, CKMB, CKMBINDEX, TROPONINI in the last 168 hours. CBG: No results for input(s): GLUCAP  in the last 168 hours. Iron Studies: No results for input(s): IRON, TIBC, TRANSFERRIN, FERRITIN in the last 72 hours. $RemoveB'@lablastinr3'DRZYpIwT$ @ Studies/Results: No results found. Medications: . cefTAZidime (FORTAZ)  IV 1 g (02/26/18 1027)  . dialysis solution 1.5% low-MG/low-CA     . sodium chloride   Intravenous Once  . sodium chloride   Intravenous Once  . allopurinol  100 mg Oral QHS  . atorvastatin  40 mg Oral QHS  . calcitRIOL  0.5 mcg Oral QHS  . cinacalcet  30 mg Oral Q supper  . gentamicin cream  1 application Topical Daily  . lanthanum  1,000 mg Oral TID WC  . metoprolol tartrate  25 mg Oral QHS  . multivitamin  1 tablet Oral QHS  . pantoprazole  40 mg Oral BID  . potassium chloride  40 mEq Oral BID  . ramelteon  8 mg Oral QHS  . senna  2 tablet Oral Daily  . sodium chloride flush  3 mL Intravenous Q12H  . vancomycin variable dose per unstable renal function (pharmacist dosing)   Does not apply See admin instructions  . venlafaxine XR  75 mg Oral QHS     Dialysis Orders: CCPD 7x/week 5 exchanges Fill vol 3000 ml Dwell Time 2 hours. 1 day exchange fill volume  3000L Day pause 4 hours.  Outpatient Hgb: 10.9 (01/14/18), 9.0 (02/09/18)  Assessment/Plan: 1. Symptomatic anemia/melena:Followed by [facility]. Multiplepast procedures have been unrevealing.S/p EGD2/14no bleedinglesions seen.GI following,plan forballoon enteroscopy outpatient. 2. PD cath-related acute peritonitis: much better now. Afebrile.Initial cell countwas 2100, 88% neutrophils. Gram stain/Cx no growth x2 days, but was given Vanc/Ceftaz prior to this collection.Last GI procedure 2/15. Abd CT was negative. Cell count is down to 21 now, IV abx working. Will switch to IP abx when pt dc'd home. Pt's pain and nausea are sig better now. Will plan prbc's today and then may be ready for dc by tomorrow.    3. ESRD: Continue CCPD while here.Changed to1.5% X 3 exchanges and 2.5% X 3 exchanges. SCr 11.81 BUN 33.   4. Hypokalemia/hypomagnesemia: K+ 3.2 K DUR 40 MEQ BID today. Magnesium 2.3. Recheck labs in AM.  5. Hypertension/volume:BP controlled, home lisinopril on hold. Changed to1.5% X 3 exchanges and 2.5% X 3 exchanges. Wt down to 119.7 kg this AM. With blood transfusion today will use 2.5% X 6 exchanges tonight.  6. Anemia: Hgblow/stable-HGB 7.7 after 1 unit PRBCs 02/25/18. Will transfuse 2 units PRBCS today. Follow HGB.   7. Metabolic bone disease:Ca 8.1 C Ca 9.2 Phos 5.0Continue Calcitriol/Sensipar. Had been using Velphoroas outpatient binder, changed to fosrenolhere D/T high Tsat. 8    Nutrition: Albumin 2.6Renal diet. Prostat, renal vits  [name] NP-C 02/26/2018, 11:05 AM  [facility] [phone]  Pt seen, examined and agree w assess/plan as above with additions as indicated.  [facility] 02/26/2018, 1:03 PM

## 2018-02-27 DIAGNOSIS — Z886 Allergy status to analgesic agent status: Secondary | ICD-10-CM

## 2018-02-27 DIAGNOSIS — Z6841 Body Mass Index (BMI) 40.0 and over, adult: Secondary | ICD-10-CM

## 2018-02-27 DIAGNOSIS — Z8711 Personal history of peptic ulcer disease: Secondary | ICD-10-CM

## 2018-02-27 DIAGNOSIS — M109 Gout, unspecified: Secondary | ICD-10-CM

## 2018-02-27 DIAGNOSIS — F419 Anxiety disorder, unspecified: Secondary | ICD-10-CM

## 2018-02-27 DIAGNOSIS — F339 Major depressive disorder, recurrent, unspecified: Secondary | ICD-10-CM

## 2018-02-27 DIAGNOSIS — Z888 Allergy status to other drugs, medicaments and biological substances status: Secondary | ICD-10-CM

## 2018-02-27 DIAGNOSIS — Z79899 Other long term (current) drug therapy: Secondary | ICD-10-CM

## 2018-02-27 LAB — TYPE AND SCREEN
ABO/RH(D): O NEG
Antibody Screen: NEGATIVE
Unit division: 0
Unit division: 0
Unit division: 0

## 2018-02-27 LAB — CBC
HCT: 32.3 % — ABNORMAL LOW (ref 36.0–46.0)
Hemoglobin: 10.1 g/dL — ABNORMAL LOW (ref 12.0–15.0)
MCH: 27.9 pg (ref 26.0–34.0)
MCHC: 31.3 g/dL (ref 30.0–36.0)
MCV: 89.2 fL (ref 80.0–100.0)
Platelets: 234 10*3/uL (ref 150–400)
RBC: 3.62 MIL/uL — ABNORMAL LOW (ref 3.87–5.11)
RDW: 17.2 % — ABNORMAL HIGH (ref 11.5–15.5)
WBC: 7 10*3/uL (ref 4.0–10.5)
nRBC: 0.9 % — ABNORMAL HIGH (ref 0.0–0.2)

## 2018-02-27 LAB — RENAL FUNCTION PANEL
Albumin: 2.7 g/dL — ABNORMAL LOW (ref 3.5–5.0)
Anion gap: 18 — ABNORMAL HIGH (ref 5–15)
BUN: 32 mg/dL — ABNORMAL HIGH (ref 6–20)
CO2: 21 mmol/L — ABNORMAL LOW (ref 22–32)
Calcium: 8.1 mg/dL — ABNORMAL LOW (ref 8.9–10.3)
Chloride: 98 mmol/L (ref 98–111)
Creatinine, Ser: 11.33 mg/dL — ABNORMAL HIGH (ref 0.44–1.00)
GFR calc Af Amer: 4 mL/min — ABNORMAL LOW (ref >60)
GFR calc non Af Amer: 4 mL/min — ABNORMAL LOW (ref >60)
Glucose, Bld: 138 mg/dL — ABNORMAL HIGH (ref 70–99)
Phosphorus: 4.1 mg/dL (ref 2.5–4.6)
Potassium: 3.2 mmol/L — ABNORMAL LOW (ref 3.5–5.1)
Sodium: 137 mmol/L (ref 135–145)

## 2018-02-27 LAB — BPAM RBC
Blood Product Expiration Date: 202003052359
Blood Product Expiration Date: 202003182359
Blood Product Expiration Date: 202003182359
ISSUE DATE / TIME: 202002211456
ISSUE DATE / TIME: 202002221217
ISSUE DATE / TIME: 202002221503
Unit Type and Rh: 9500
Unit Type and Rh: 9500
Unit Type and Rh: 9500

## 2018-02-27 LAB — MAGNESIUM: Magnesium: 2.2 mg/dL (ref 1.7–2.4)

## 2018-02-27 MED ORDER — HYDROCODONE-ACETAMINOPHEN 5-325 MG PO TABS: 1.0000 | ORAL_TABLET | Freq: Two times a day (BID) | ORAL | 0 refills | Status: AC | PRN

## 2018-02-27 MED ORDER — POTASSIUM CHLORIDE CRYS ER 20 MEQ PO TBCR
40.0000 meq | EXTENDED_RELEASE_TABLET | Freq: Two times a day (BID) | ORAL | Status: DC
  Administered 2018-02-27: 40 meq via ORAL
  Filled 2018-02-27: qty 2

## 2018-02-27 MED ORDER — HYDROCODONE-ACETAMINOPHEN 5-325 MG PO TABS: 1.0000 | ORAL_TABLET | Freq: Two times a day (BID) | ORAL | Status: DC | PRN

## 2018-02-27 NOTE — Discharge Summary (Addendum)
Name: Patricia [name] MRN: [id number] DOB: 06/11/1975 43 y.o. PCP: Elayne Guerin  Date of Admission: 02/18/2018 12:33 PM Date of Discharge: 02/27/2018 1:00 PM Attending Physician: Carole Civil MD  Discharge Diagnosis: 1. GI Bleed 2. Symptomatic Anemia 3. Peritonitis associated with Peritoneal Dialysis  Discharge Medications: Allergies as of 02/27/2018      Reactions   Nsaids Other (See Comments)   Cannot take due to Kidney disease/Kidney function   Tolmetin Other (See Comments)   Cannot take due to Kidney Disease      Medication List    STOP taking these medications   lisinopril 10 MG tablet Commonly known as:  PRINIVIL,ZESTRIL     TAKE these medications   allopurinol 100 MG tablet Commonly known as:  ZYLOPRIM Take 100 mg by mouth at bedtime.   ALPRAZolam 1 MG tablet Commonly known as:  XANAX Take 1 mg by mouth 2 (two) times daily as needed for anxiety.   atorvastatin 40 MG tablet Commonly known as:  LIPITOR Take 40 mg by mouth at bedtime.   calcitRIOL 0.5 MCG capsule Commonly known as:  ROCALTROL Take 1 capsule (0.5 mcg total) by mouth daily with breakfast. What changed:  when to take this   cinacalcet 30 MG tablet Commonly known as:  SENSIPAR Take 1 tablet (30 mg total) by mouth daily. What changed:  when to take this   diphenhydramine-acetaminophen 25-500 MG Tabs tablet Commonly known as:  TYLENOL PM Take 2 tablets by mouth at bedtime as needed (sleep).   esomeprazole 40 MG capsule Commonly known as:  NEXIUM Take 1 capsule (40 mg total) by mouth 2 (two) times daily.   HYDROcodone-acetaminophen 5-325 MG tablet Commonly known as:  NORCO/VICODIN Take 1 tablet by mouth every 12 (twelve) hours as needed for up to 5 days for severe pain.   hydrOXYzine 25 MG tablet Commonly known as:  ATARAX/VISTARIL Take 25 mg by mouth every 6 (six) hours as needed for itching.   metoprolol tartrate 25 MG tablet Commonly known as:  LOPRESSOR Take 25 mg by  mouth at bedtime.   multivitamin Tabs tablet Take 1 tablet by mouth at bedtime.   prochlorperazine 10 MG tablet Commonly known as:  COMPAZINE Take 1 tablet (10 mg total) by mouth every 8 (eight) hours as needed for nausea or vomiting.   senna 8.6 MG Tabs tablet Commonly known as:  SENOKOT Take 2 tablets (17.2 mg total) by mouth at bedtime. What changed:    when to take this  reasons to take this   VELPHORO 500 MG chewable tablet Generic drug:  sucroferric oxyhydroxide Chew 1,000 mg by mouth 3 (three) times daily with meals.   venlafaxine XR 75 MG 24 hr capsule Commonly known as:  EFFEXOR-XR Take 75 mg by mouth at bedtime.       Disposition and follow-up:   [name] was discharged from [facility] in Stable condition.  At the hospital follow up visit please address:  1. GI Bleed - Please ensure she makes her f/u appt with Dr.Mansouraty  for balloon enteroscopy  2. Symptomatic Anemia - Hemoglobin at discharge 10.1 - Please check cbc for hgb monitoring  3. Peritonitis associated with Peritoneal Dialysis - Please check her abdomen for resolution of her symptoms - Please check a bmp for resolution of her hypokalemia  2.  Labs / imaging needed at time of follow-up: cbc  3.  Pending labs/ test needing follow-up: N/A  Follow-up Appointments: Follow-up Information  [facility].   Specialty:  Home Health Services Why:  Home Health Physical Therapy Contact information: [address] [place] [address] [place] [address] [phone]           Hospital Course by problem list:  1. GI Bleed: [name] is a 43 yo F w/ PMH of gout, depression, anxiety, morbid obesity, small bowel AVM and ESRD on peritoneal dialysis presenting from Dr.Mansouraty's office after a hemoglobin drop down to 7.8 after episode of melena. She had a negative EGD day before admission. She was admitted for hemoglobin monitoring. Inpatient GI was consulted  and she was given dose of Octreotide. GI were able to set up a balloon enteroscopy with Dr.Mansouraty as outpatient. Her melena did not reoccur during her hospital stay and she had normal bowel movements. Discharged w/ recommendation to f/u with GI.  2. Symptomatic Anemia: Presented w/ hemoglobin drop from baseline of 10 to 7.8 over the last couple weeks. Hemoglobin slowly trended up to ~8.5 and stayed stable until hospital day 8 when it dropped from 8.8 to 6.9 in 1 day. Blood smear showed polychromasia and increased retic count. LDH was wnl. She was given 3 units of pRBCs. Her post transfusion hemoglobin stayed stable at 10.1 until she was discharged.  3. Peritonitis associated with Peritoneal Dialysis: Developed significant abdominal pain on hospital day 2 without obvious inciting event. Decubitus X-ray showed pneumoperitoneum expected in patient undergoing peritoneal dialysis. Peritoneal fluid was collected which showed 2100 wbc w/ 88% neutrophil predominance. Fluid culture was started. Started on fetazidme and vancomycin. Continued to endorse significant abdominal pain. Repeat peritoneal fluid analysis showed reduction in wbc count down to 16. Culture did not grow any organism. Discharged with pain meds and recommendation to f/u with nephrology to set up intra-peritoneal abx therapy to finish 14 day course.  Discharge Vitals:   BP 115/66 (BP Location: Right Arm)   Pulse 66   Temp 98.1 F (36.7 C) (Oral)   Resp 20   Ht 5\' 7"  (1.702 m)   Wt 122.2 kg   SpO2 98%   BMI 42.19 kg/m   Pertinent Labs, Studies, and Procedures:  CBC Latest Ref Rng & Units 02/27/2018 02/26/2018 02/26/2018  WBC 4.0 - 10.5 K/uL 7.0 - 7.3  Hemoglobin 12.0 - 15.0 g/dL 10.1(L) 10.1(L) 7.7(L)  Hematocrit 36.0 - 46.0 % 32.3(L) 32.0(L) 24.9(L)  Platelets 150 - 400 K/uL 234 - 224   BMP Latest Ref Rng & Units 02/27/2018 02/26/2018 02/25/2018  Glucose 70 - 99 mg/dL 02/27/2018) 461(Q) 471(H)  BUN 6 - 20 mg/dL 267(W) 22(U) 42(L)    Creatinine 0.44 - 1.00 mg/dL 48(A) 13.98(J) 73.29(D)  Sodium 135 - 145 mmol/L 137 135 137  Potassium 3.5 - 5.1 mmol/L 3.2(L) 3.2(L) 3.1(L)  Chloride 98 - 111 mmol/L 98 95(L) 101  CO2 22 - 32 mmol/L 21(L) 25 25  Calcium 8.9 - 10.3 mg/dL 8.1(L) 8.1(L) 8.0(L)   ABDOMEN - 1 VIEW DECUBITUS IMPRESSION: Pneumoperitoneum is noted along the right side of the lateral abdominal wall on this decubitus projection that most likely is related to peritoneal dialysis. No evidence of bowel obstruction or ileus.  CT ABDOMEN AND PELVIS WITHOUT CONTRAST IMPRESSION: 1. Trace abdominal ascites. Small volume pneumoperitoneum in the greater and lesser peritoneal spaces. Likely etiology of pneumoperitoneum and ascites is peritoneal dialysis. Tenckhoff type peritoneal dialysis catheter is positioned with tip coiled in the midline low abdomen.  2. Occasional sigmoid diverticula with high-density stool balls in the distal colon and  rectum. No findings to suggest diverticulitis or other etiology of bowel perforation.  Discharge Instructions: Discharge Instructions    Call MD for:  difficulty breathing, headache or visual disturbances   Complete by:  As directed    Call MD for:  difficulty breathing, headache or visual disturbances   Complete by:  As directed    Call MD for:  extreme fatigue   Complete by:  As directed    Call MD for:  persistant dizziness or light-headedness   Complete by:  As directed    Call MD for:  persistant dizziness or light-headedness   Complete by:  As directed    Call MD for:  persistant nausea and vomiting   Complete by:  As directed    Call MD for:  persistant nausea and vomiting   Complete by:  As directed    Diet - low sodium heart healthy   Complete by:  As directed    Diet - low sodium heart healthy   Complete by:  As directed    Discharge instructions   Complete by:  As directed    Thank you for allowing [place] to care for you  You have been treated for a GI  Bleed and Peritonitis (Infection of the lining of your abdomen)  We have stopped your blood pressure medicine (Lisinopril) for now as your blood pressure has been in the low normal range, please have this rechecked at your follow up with your PCP or Nephrologist.  For your GI Bleed - Your blood counts have stablized with transfusions - Follow up with your GI, Dr. Rush Landmark for further evaluation  For your Peritonitis - You will continue taking antibiotitc through your dialysis catheter, this will be arranged by nephrology - You have been prescribed a 5 day course of pain medication to be taken twice a day as need  Please follow up with your GI doctor as scheduled by them  Please follow up with your PCP in the next 1-2 weeks  Please follow up with nephrology as needed   Increase activity slowly   Complete by:  As directed    Increase activity slowly   Complete by:  As directed       Signed: Mosetta Anis, MD 03/02/2018, 6:29 AM   Pager: [phone]

## 2018-02-27 NOTE — Progress Notes (Addendum)
Patricia [name] Progress Note   Subjective: No new complaints. Still has tenderness upper quadrants of abdomin but this is minimal. No dark or bloody stools reported. No issues with PD over night.   Objective Vitals:   02/26/18 1739 02/26/18 1745 02/26/18 2109 02/27/18 0500  BP: 114/68 113/63 129/68 122/64  Pulse: 75 80 76 73  Resp: $Remo'18 18 20 18  'iUBLR$ Temp: 98.4 F (36.9 C) 98.1 F (36.7 C) 98.3 F (36.8 C) 98.7 F (37.1 C)  TempSrc: Oral Oral Oral Oral  SpO2: 100% 98% 100% 100%  Weight:  125 kg 125 kg   Height:       Physical Exam General: Obese female in NAD Heart: S1,S2 RRR Lungs: CTAB A/P Abdomen: active BS. PD cath LUQ Drsg intact Extremities: Bilateral trace pedal edema Dialysis Access: PD cath intact. Drsg CDI   Additional Objective Labs: Basic Metabolic Panel: Recent Labs  Lab 02/25/18 1025 02/26/18 0401 02/27/18 0430  NA 137 135 137  K 3.1* 3.2* 3.2*  CL 101 95* 98  CO2 25 25 21*  GLUCOSE 129* 116* 138*  BUN 34* 33* 32*  CREATININE 11.90* 11.81* 11.33*  CALCIUM 8.0* 8.1* 8.1*  PHOS 4.5 5.0* 4.1   Liver Function Tests: Recent Labs  Lab 02/25/18 1025 02/26/18 0401 02/27/18 0430  ALBUMIN 2.5* 2.6* 2.7*   No results for input(s): LIPASE, AMYLASE in the last 168 hours. CBC: Recent Labs  Lab 02/22/18 0357 02/23/18 0428 02/24/18 0534 02/25/18 1025  02/26/18 0401 02/26/18 2033 02/27/18 0430  WBC 10.5 9.9 7.6 6.6  --  7.3  --  7.0  NEUTROABS 7.8*  --   --  5.4  --  4.6  --   --   HGB 8.6* 9.1* 8.8* 6.9*   < > 7.7* 10.1* 10.1*  HCT 26.4* 28.8* 27.3* 22.4*   < > 24.9* 32.0* 32.3*  MCV 89.5 91.7 91.9 93.3  --  90.5  --  89.2  PLT 299 310 284 235  --  224  --  234   < > = values in this interval not displayed.   Blood Culture    Component Value Date/Time   SDES PERITONEAL DIALYSATE 02/21/2018 2230   SPECREQUEST NONE 02/21/2018 2230   CULT NO GROWTH 02/21/2018 2230   REPTSTATUS 02/25/2018 FINAL 02/21/2018 2230    Cardiac  Enzymes: No results for input(s): CKTOTAL, CKMB, CKMBINDEX, TROPONINI in the last 168 hours. CBG: No results for input(s): GLUCAP in the last 168 hours. Iron Studies: No results for input(s): IRON, TIBC, TRANSFERRIN, FERRITIN in the last 72 hours. $RemoveB'@lablastinr3'NLtexTBQ$ @ Studies/Results: No results found. Medications: . cefTAZidime (FORTAZ)  IV 1 g (02/26/18 1027)  . dialysis solution 1.5% low-MG/low-CA     . sodium chloride   Intravenous Once  . sodium chloride   Intravenous Once  . allopurinol  100 mg Oral QHS  . atorvastatin  40 mg Oral QHS  . calcitRIOL  0.5 mcg Oral QHS  . cinacalcet  30 mg Oral Q supper  . gentamicin cream  1 application Topical Daily  . lanthanum  1,000 mg Oral TID WC  . metoprolol tartrate  25 mg Oral QHS  . multivitamin  1 tablet Oral QHS  . pantoprazole  40 mg Oral BID  . ramelteon  8 mg Oral QHS  . senna  2 tablet Oral Daily  . sodium chloride flush  3 mL Intravenous Q12H  . vancomycin variable dose per unstable renal function (pharmacist dosing)   Does not apply  See admin instructions  . venlafaxine XR  75 mg Oral QHS     Dialysis Orders: CCPD 7x/week 5 exchanges Fill vol 3000 ml Dwell Time 2 hours. 1 day exchange fill volume 3000L Day pause 4 hours.  Outpatient Hgb: 10.9 (01/14/18), 9.0 (02/09/18)  Assessment/Plan: 1. Symptomatic anemia/melena:Followed by [facility]. Multiplepast procedures have been unrevealing.S/p EGD2/14no bleedinglesions seen.GI following,plan forballoon enteroscopy outpatient. 2. PD cath-related acute peritonitis: much better now. Afebrile.Initial cell countwas 2100, 88% neutrophils. Gram stain/Cx no growth x2 days, but was given Vanc/Ceftaz prior to the culture being sent.  Abd CT was negative. Cell count improved to 21. I spoke w/ PD RN at [facility] on call today, she will pass along to [facility] PD tomorrow that pt needs 1 more week of IP [place] and Vanc starting tomorrow.  3. ESRD: Continue CCPD while here SCr 11.33 BUN 32.  No issues filling or draining reported.  4. Hypokalemia/hypomagnesemia: K+ remains 3.2 K DUR 40 MEQ BID today. Liberalize diet.  Magnesium 2.2. Recheck labs in AM.  5. Hypertension/volume:BP controlled, home lisinopril on hold. Changed to1.5%X 3 exchanges and 2.5% X 3 exchanges. Wt down to 119.7 kg this AM. With blood transfusion 02/26/18 used 2.5% X 6 exchanges. Wt incorrect-getting standing wt. Use 3 exchanges 1.5%, 2 exchanges 2.5% and 1 exchange of 4.25% tonight.  6. Anemia: Hgblow/stable-HGB 7.7 after 1 unit PRBCs 02/25/18. Rec'd 2 units PRBCs 02/26/18 HGB 10.1 today. Denies melena. Follow HGB.  7. Metabolic bone disease:Calcium and Phos controlled.Continue Calcitriol/Sensipar. Had been using Velphoroas outpatient binder, changed to fosrenolhere D/T high Tsat. 8Nutrition:Albumin 2.6Renal diet.Prostat, renal vits  Disposition: Hopefully DC home today. Will need to arrange with home therapies of  IP ABX.   Patricia H. Brown NP-C 02/27/2018, 8:24 AM  [name] [phone]  Pt seen, examined and agree w assess/plan as above with additions as indicated.  [facility] 02/27/2018, 4:48 PM

## 2018-02-27 NOTE — Progress Notes (Addendum)
Internal Medicine Attending  Date: 02/27/2018  [name] Medical record number: [id number] Date of birth: 1975-11-07 Age: 43 y.o. Gender: female  I saw and evaluated the patient. I reviewed the resident's note by Dr. Trilby Drummer and I agree with the resident's findings and plans as documented in his progress note.  When seen on rounds this morning Patricia [name] felt improved with decreasing abdominal pain.  She had a normal bowel movement yesterday.  Her blood counts are stable.  She is stable for discharge home today.  Follow-up will be with her nephrologist and primary care provider to assure that the planned course of the intraperitoneal antibiotics have been effective at resolving her peritonitis.

## 2018-02-27 NOTE — Progress Notes (Signed)
Patricia [name] to be D/C'd Home per MD order.  Discussed prescriptions and follow up appointments with the patient. Prescriptions given to patient, medication list explained in detail. Pt verbalized understanding.  Allergies as of 02/27/2018      Reactions   Nsaids Other (See Comments)   Cannot take due to Kidney disease/Kidney function   Tolmetin Other (See Comments)   Cannot take due to Kidney Disease      Medication List    STOP taking these medications   lisinopril 10 MG tablet Commonly known as:  PRINIVIL,ZESTRIL     TAKE these medications   allopurinol 100 MG tablet Commonly known as:  ZYLOPRIM Take 100 mg by mouth at bedtime.   ALPRAZolam 1 MG tablet Commonly known as:  XANAX Take 1 mg by mouth 2 (two) times daily as needed for anxiety.   atorvastatin 40 MG tablet Commonly known as:  LIPITOR Take 40 mg by mouth at bedtime.   calcitRIOL 0.5 MCG capsule Commonly known as:  ROCALTROL Take 1 capsule (0.5 mcg total) by mouth daily with breakfast. What changed:  when to take this   cinacalcet 30 MG tablet Commonly known as:  SENSIPAR Take 1 tablet (30 mg total) by mouth daily. What changed:  when to take this   diphenhydramine-acetaminophen 25-500 MG Tabs tablet Commonly known as:  TYLENOL PM Take 2 tablets by mouth at bedtime as needed (sleep).   esomeprazole 40 MG capsule Commonly known as:  NEXIUM Take 1 capsule (40 mg total) by mouth 2 (two) times daily.   HYDROcodone-acetaminophen 5-325 MG tablet Commonly known as:  NORCO/VICODIN Take 1 tablet by mouth every 12 (twelve) hours as needed for up to 5 days for severe pain.   hydrOXYzine 25 MG tablet Commonly known as:  ATARAX/VISTARIL Take 25 mg by mouth every 6 (six) hours as needed for itching.   metoprolol tartrate 25 MG tablet Commonly known as:  LOPRESSOR Take 25 mg by mouth at bedtime.   multivitamin Tabs tablet Take 1 tablet by mouth at bedtime.   prochlorperazine 10 MG tablet Commonly known  as:  COMPAZINE Take 1 tablet (10 mg total) by mouth every 8 (eight) hours as needed for nausea or vomiting.   senna 8.6 MG Tabs tablet Commonly known as:  SENOKOT Take 2 tablets (17.2 mg total) by mouth at bedtime. What changed:    when to take this  reasons to take this   VELPHORO 500 MG chewable tablet Generic drug:  sucroferric oxyhydroxide Chew 1,000 mg by mouth 3 (three) times daily with meals.   venlafaxine XR 75 MG 24 hr capsule Commonly known as:  EFFEXOR-XR Take 75 mg by mouth at bedtime.       Vitals:   02/27/18 0500 02/27/18 0809  BP: 122/64 115/66  Pulse: 73 66  Resp: 18 20  Temp: 98.7 F (37.1 C) 98.1 F (36.7 C)  SpO2: 100% 98%    IV catheter discontinued intact. Site without signs and symptoms of complications. Dressing and pressure applied. Pt denies pain at this time. No complaints noted.  An After Visit Summary was printed and given to the patient. Patient escorted via [place], and D/C home via private auto.  Orville Govern, RN

## 2018-02-27 NOTE — Progress Notes (Addendum)
   Subjective:  Patricia [name] is a 43 y.o. with PMH of depression, anxiety, morbid obesity, gout, ESRD on PD, Peptic ulcer disease and small bowel AVM admit for melena on hospital day 9  Patricia [name] was seen on rounds this morning. She states she continues to improve and did not have to use much pain medication at all. She continues to have some pain when moving around but it is improved. She was able to have a BM yesterday and denies and blood or dark stools. She feels ready to go home and has no other questions.  Objective:  Vital signs in last 24 hours: Vitals:   02/26/18 1745 02/26/18 2109 02/27/18 0500 02/27/18 0809  BP: 113/63 129/68 122/64 115/66  Pulse: 80 76 73 66  Resp: $Remo'18 20 18 20  'dmkei$ Temp: 98.1 F (36.7 C) 98.3 F (36.8 C) 98.7 F (37.1 C) 98.1 F (36.7 C)  TempSrc: Oral Oral Oral Oral  SpO2: 98% 100% 100% 98%  Weight: 125 kg 125 kg  122.2 kg  Height:       Physical Exam  Constitutional: She is well-developed, well-nourished, and in no distress.  Cardiovascular: Normal rate, normal heart sounds and intact distal pulses.  No murmur heard. Pulmonary/Chest: Effort normal and breath sounds normal. She has no wheezes. She has no rales.  Abdominal: Soft. Bowel sounds are normal. There is abdominal tenderness (Mild tenderness to palpation of LUQ). There is no rebound and no guarding.  Musculoskeletal: Normal range of motion.        General: No edema.  Neurological: She is alert.  Skin: Skin is warm and dry.   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 Her abdominal pain has improved and Hgb has  stabilized.  Acute onset abdominal pain 2/2 peritonitis Has remained Afebrile with WBC WNL for several days. Repeat dialysis fluid cell count normalized on IV abx. Subjective improvement in pain. - Appreciate nephro recs - C/w ceftazidime, vanc - Will discharge on 2-3 of intraperitoneal (Nephrology will arrange) - D/C dilaudid, Start PO Norco PRN q12h - Scheduled senokot, PRN miralax for constipation  Melena, Symptomatic Anemia 2/2 bleeding small bowel AVM vs diverticular bleed Negative upper endoscopy on admission. Hgb stable at ~10 s/p 2U yesterday. BMs 2/21 and 2/22 without melena or hematochezia. - Appreciate GI recs: Continue plan for outpatient enteroscopy on 2/27 - C/w pantoprazole $RemoveBeforeDE'40mg'OnSANfRHIcOJAmE$  BID, Zofran $RemoveBef'4mg'vAksniFCKs$  q6hr PRN  ESRD on peritoneal dialysis:  Peritoneal dialysis managed by nephro. - Appreciate Nephro recs - Trend renal panel - C/w home meds: cinacalcet $RemoveBeforeDE'30mg'RYRNZZWLboSdMiu$  qhs, calcitriol 0.48mcg qhs, Rena-Vit daily qhs, hydroxyzine $RemoveBeforeDE'25mg'wsVJotJpBLHMZOn$  q6hr PRN for itching  DVT prophx: SCDs Diet: Regular Bowel: Miralax, Senokot Code: Full  Dispo: Anticipated discharge later today.  [name], MD 02/27/2018, 11:05 AM

## 2018-02-28 LAB — PATHOLOGIST SMEAR REVIEW

## 2018-03-03 ENCOUNTER — Encounter (HOSPITAL_COMMUNITY): Payer: Self-pay

## 2018-03-03 ENCOUNTER — Ambulatory Visit (HOSPITAL_COMMUNITY)
Admission: RE | Admit: 2018-03-03 | Discharge: 2018-03-03 | Disposition: A | Discharge status: Home / Self Care | Payer: Medicare Other | Source: Ambulatory Visit | Attending: Gastroenterology | Admitting: Gastroenterology

## 2018-03-03 DIAGNOSIS — K253 Acute gastric ulcer without hemorrhage or perforation: Secondary | ICD-10-CM | POA: Insufficient documentation

## 2018-03-03 DIAGNOSIS — R110 Nausea: Secondary | ICD-10-CM | POA: Insufficient documentation

## 2018-03-03 DIAGNOSIS — K5520 Angiodysplasia of colon without hemorrhage: Secondary | ICD-10-CM

## 2018-03-03 DIAGNOSIS — R6881 Early satiety: Secondary | ICD-10-CM | POA: Insufficient documentation

## 2018-03-03 DIAGNOSIS — D649 Anemia, unspecified: Secondary | ICD-10-CM

## 2018-03-29 ENCOUNTER — Telehealth: Payer: Self-pay | Admitting: Gastroenterology

## 2018-03-29 ENCOUNTER — Telehealth (INDEPENDENT_AMBULATORY_CARE_PROVIDER_SITE_OTHER): Payer: Medicare Other | Admitting: Gastroenterology

## 2018-03-29 ENCOUNTER — Other Ambulatory Visit: Payer: Self-pay

## 2018-03-29 DIAGNOSIS — R6881 Early satiety: Secondary | ICD-10-CM

## 2018-03-29 DIAGNOSIS — R195 Other fecal abnormalities: Secondary | ICD-10-CM | POA: Diagnosis not present

## 2018-03-29 DIAGNOSIS — K5520 Angiodysplasia of colon without hemorrhage: Secondary | ICD-10-CM

## 2018-03-29 DIAGNOSIS — Z8719 Personal history of other diseases of the digestive system: Secondary | ICD-10-CM | POA: Diagnosis not present

## 2018-03-29 DIAGNOSIS — R1084 Generalized abdominal pain: Secondary | ICD-10-CM

## 2018-03-29 DIAGNOSIS — R112 Nausea with vomiting, unspecified: Secondary | ICD-10-CM | POA: Diagnosis not present

## 2018-03-29 NOTE — Progress Notes (Signed)
I called and attempted to speak with the patient on 2 occasions at time of appointment. The phone number listed went directly to voicemail. I left a voicemail for patient call back. We will plan on rescheduling her virtual visit/telephone visit for the next 1 to 2 weeks. We will attempt to reach out to her before end of week to make sure she is doing okay.  Justice Britain, MD [facility] Advanced Endoscopy Office # [phone]

## 2018-03-29 NOTE — Telephone Encounter (Signed)
The pt was advised to complete her testing as ordered today and when results are received she will be called with further recommendations The pt has been advised of the information and verbalized understanding.  Marland Kitchen

## 2018-03-29 NOTE — Addendum Note (Signed)
Addended by: Justice Britain on: 03/29/2018 03:55 PM   Modules accepted: Orders

## 2018-03-29 NOTE — Telephone Encounter (Signed)
Pt called in wanting to speak back with the doctor to see if he can call her in some pain medication. Pt stated she do not think he has prescribe any for her but it needing some called in.

## 2018-03-29 NOTE — Patient Instructions (Addendum)
Your provider has requested that you go to the basement level for lab work tomorrow (03/30/2018). Press "B" on the elevator. The lab is located at the first door on the left as you exit the elevator.  Thank you for choosing me and [facility].  Dr. Rush Landmark

## 2018-03-29 NOTE — Addendum Note (Signed)
Addended byDebbe Mounts on: 03/29/2018 04:10 PM   Modules accepted: Orders

## 2018-03-30 ENCOUNTER — Other Ambulatory Visit (INDEPENDENT_AMBULATORY_CARE_PROVIDER_SITE_OTHER): Payer: Medicare Other

## 2018-03-30 DIAGNOSIS — Z8719 Personal history of other diseases of the digestive system: Secondary | ICD-10-CM | POA: Diagnosis not present

## 2018-03-30 DIAGNOSIS — K5520 Angiodysplasia of colon without hemorrhage: Secondary | ICD-10-CM | POA: Diagnosis not present

## 2018-03-30 DIAGNOSIS — R195 Other fecal abnormalities: Secondary | ICD-10-CM

## 2018-03-30 LAB — CBC
HCT: 35.6 % — ABNORMAL LOW (ref 36.0–46.0)
Hemoglobin: 11.6 g/dL — ABNORMAL LOW (ref 12.0–15.0)
MCHC: 32.6 g/dL (ref 30.0–36.0)
MCV: 89 fl (ref 78.0–100.0)
Platelets: 416 10*3/uL — ABNORMAL HIGH (ref 150.0–400.0)
RBC: 4 Mil/uL (ref 3.87–5.11)
RDW: 16.8 % — ABNORMAL HIGH (ref 11.5–15.5)
WBC: 12.2 10*3/uL — ABNORMAL HIGH (ref 4.0–10.5)

## 2018-03-30 NOTE — Addendum Note (Signed)
Addended byDebbe Mounts on: 03/30/2018 08:25 AM   Modules accepted: Orders

## 2018-03-30 NOTE — Progress Notes (Signed)
[place] VISIT   Primary Care Provider Blair Heys, PA-C [id number] [place] [address] [phone]  Patient Profile: Patricia [name] is a 43 y.o. 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  The patient presents to the [facility] for an evaluation and management of problem(s) noted below:  Problem List 1. AVM (arteriovenous malformation) of small bowel, acquired   2. Dark stools   3. History of peritonitis   4. Non-intractable vomiting with nausea, unspecified vomiting type   5. Early satiety     History of Present Illness: Please see initial consultation note for full details of HPI.  This service was provided via telemedicine (Telephone) as a result of COVID-19 pandemic. The patient was located at home. The provider was located in the office. The patient did consent to this telephone visit and is aware of possible charges through their insurance for this visit. The other persons participating in this telemedicine service were none. Time spent on call was 22 minutes.  Interval History This was a scheduled follow-up for the patient since her recent hospitalization.  When I had seen her for her upper endoscopy/enteroscopy that I did at the endoscopy center she had been dropping her hemoglobin.  We did not find a source of bleeding at the time and sent her to the hospital for admission in the sense of potential need of device assisted enteroscopy versus transfer to another center.  During her hospital course her hemoglobin stabilized.  A repeat capsule was not performed.  The patient subsequently had stabilization in her blood count but developed worsening abdominal pain.  She was diagnosed with peritonitis associated from her peritoneal dialysis catheter.  She was treated with  antibiotics and eventually discharged.  Today, on the telephone telemedicine visit we discussed her how she was doing.  She describes over the course of the last 3 days that she feels that she is once again having GI blood loss.  She is describing loose darker stools that smell extremely bad.  The patient describes no overt bright red blood per rectum or maroon stool.  She is also describing over the course the last 36 hours progressive worsening of abdominal discomfort that to her is very reminiscent of the pain she had when she was diagnosed with peritonitis.  She was supposed to have fluid removed and sampled by nephrology however because of the COVID-19 pandemic she has not been able to have a nurse come out to sample the fluid.  She is in the process of reaching out to them today.  The patient is still having nausea with vomiting.  She describes being attempted for gastric emptying study though I see no actual record of that but she describes trying to have the radiotracer egg substitute but was not able to tolerate that for the procedure itself.  She is describing loose bowel movements since her hospital stay and they are continuing and they are occurring up to 4 5 times per day.  The Bristol scale is at least 5 if not 6.  GI Review of Systems Positive as above including bloating, early satiety, nausea, vomiting Negative for hematemesis, coffee-ground emesis, hematochezia, jaundice odynophagia, jaundice, hematochezia  Review of Systems General: Denies fevers/chills Cardiovascular: Denies chest pain Pulmonary: Denies shortness of breath Gastroenterological: See HPI Genitourinary: Denies darkened urine Hematological: Positive for easy bruising Dermatological: Denies jaundice Psychological: Mood is  anxious and she has multiple of her children at home and she is worried about going back into the hospital because of the COVID-19 pandemic   Medications Current Outpatient Medications  Medication  Sig Dispense Refill   allopurinol (ZYLOPRIM) 100 MG tablet Take 100 mg by mouth at bedtime.      ALPRAZolam (XANAX) 1 MG tablet Take 1 mg by mouth 2 (two) times daily as needed for anxiety.      atorvastatin (LIPITOR) 40 MG tablet Take 40 mg by mouth at bedtime.   3   calcitRIOL (ROCALTROL) 0.5 MCG capsule Take 1 capsule (0.5 mcg total) by mouth daily with breakfast. (Patient taking differently: Take 0.5 mcg by mouth at bedtime. ) 30 capsule 0   cinacalcet (SENSIPAR) 30 MG tablet Take 1 tablet (30 mg total) by mouth daily. (Patient taking differently: Take 30 mg by mouth daily with supper. ) 30 tablet 0   diphenhydramine-acetaminophen (TYLENOL PM) 25-500 MG TABS tablet Take 2 tablets by mouth at bedtime as needed (sleep).      hydrOXYzine (ATARAX/VISTARIL) 25 MG tablet Take 25 mg by mouth every 6 (six) hours as needed for itching.      metoprolol tartrate (LOPRESSOR) 25 MG tablet Take 25 mg by mouth at bedtime.      multivitamin (RENA-VIT) TABS tablet Take 1 tablet by mouth at bedtime.      omeprazole (PRILOSEC) 40 MG capsule Take 40 mg by mouth 2 (two) times daily.     prochlorperazine (COMPAZINE) 10 MG tablet Take 1 tablet (10 mg total) by mouth every 8 (eight) hours as needed for nausea or vomiting. 50 tablet 2   promethazine (PHENERGAN) 25 MG tablet Take 25 mg by mouth every 8 (eight) hours as needed for nausea or vomiting.     senna (SENOKOT) 8.6 MG TABS tablet Take 2 tablets (17.2 mg total) by mouth at bedtime. (Patient taking differently: Take 2 tablets by mouth at bedtime as needed for mild constipation. ) 120 each 0   sucroferric oxyhydroxide (VELPHORO) 500 MG chewable tablet Chew 1,000 mg by mouth 3 (three) times daily with meals.      venlafaxine XR (EFFEXOR-XR) 75 MG 24 hr capsule Take 75 mg by mouth at bedtime.      Current Facility-Administered Medications  Medication Dose Route Frequency Provider Last Rate Last Dose   0.9 %  sodium chloride infusion  500 mL  Intravenous Once Mansouraty, Telford Nab., MD        Allergies Allergies  Allergen Reactions   Nsaids Other (See Comments)    Cannot take due to Kidney disease/Kidney function    Tolmetin Other (See Comments)    Cannot take due to Kidney Disease    Histories Past Medical History:  Diagnosis Date   Acid reflux    Anxiety    Arthritis    Dyspnea    Dysrhythmia    tachycardia   ESRD (end stage renal disease) ([place])    [facility] [place]   GI bleeding 12/2017   Gout    Headache(784.0)    "related to chemo; sometimes weekly" (09/12/2013)   High cholesterol    History of blood transfusion "a couple"   "related to low counts"   Hypertension    Iron deficiency anemia    "get epogen shots q month" (02/20/2014)   MCGN (minimal change glomerulonephritis)    "using chemo to tx;  finished my last tx in 12/2013"   Peritoneal dialysis status [facility])    Past Surgical History:  Procedure Laterality Date   ABDOMINAL HYSTERECTOMY  2010   "laparoscopic"   ANKLE FRACTURE SURGERY Right 1994   AV FISTULA PLACEMENT Left 04/14/2016   Procedure: ARTERIOVENOUS (AV) FISTULA CREATION;  Surgeon: Chuck Hint, MD;  Location: [facility] OR;  Service: Vascular;  Laterality: Left;   AV FISTULA PLACEMENT Left 08/09/2017   Procedure: INSERTION OF ARTERIOVENOUS (AV) GORE-TEX GRAFT LEFT ARM;  Surgeon: Sherren Kerns, MD;  Location: [facility] OR;  Service: Vascular;  Laterality: Left;   AVGG REMOVAL Left 08/11/2017   Procedure: REMOVAL OF ARTERIOVENOUS GORETEX GRAFT (AVGG);  Surgeon: Nada Libman, MD;  Location[facility] OR;  Service: Vascular;  Laterality: Left;   BIOPSY  09/03/2017   Procedure: BIOPSY;  Surgeon: Kathi Der, MD;  Location: [facility] ENDOSCOPY;  Service: Gastroenterology;;   BIOPSY  12/24/2017   Procedure: BIOPSY;  Surgeon: Lemar Lofty., MD;  Location: [facility] ENDOSCOPY;  Service: Gastroenterology;;   CARDIAC CATHETERIZATION  2000's   COLONOSCOPY WITH PROPOFOL N/A  09/04/2017   Procedure: COLONOSCOPY WITH PROPOFOL;  Surgeon: Kerin Salen, MD;  Location: [facility] ENDOSCOPY;  Service: Gastroenterology;  Laterality: N/A;   ENTEROSCOPY N/A 12/24/2017   Procedure: ENTEROSCOPY;  Surgeon: Meridee Score Netty Starring., MD;  Location: [facility] ENDOSCOPY;  Service: Gastroenterology;  Laterality: N/A;   ESOPHAGOGASTRODUODENOSCOPY     ESOPHAGOGASTRODUODENOSCOPY (EGD) WITH PROPOFOL Left 06/04/2017   Procedure: ESOPHAGOGASTRODUODENOSCOPY (EGD) WITH PROPOFOL;  Surgeon: Willis Modena, MD;  Location: [facility] ENDOSCOPY;  Service: Endoscopy;  Laterality: Left;   ESOPHAGOGASTRODUODENOSCOPY (EGD) WITH PROPOFOL N/A 09/03/2017   Procedure: ESOPHAGOGASTRODUODENOSCOPY (EGD) WITH PROPOFOL;  Surgeon: Kathi Der, MD;  Location: [facility] ENDOSCOPY;  Service: Gastroenterology;  Laterality: N/A;   FISTULA SUPERFICIALIZATION Left 04/02/2017   Procedure: FISTULA SUPERFICIALIZATION LEFT ARM;  Surgeon: Nada Libman, MD;  Location: [facility] OR;  Service: Vascular;  Laterality: Left;   FISTULOGRAM Left 04/07/2016   Procedure: FISTULOGRAM;  Surgeon: Maeola Harman, MD;  Location: [facility] OR;  Service: Vascular;  Laterality: Left;   FRACTURE SURGERY     right ankle   GIVENS CAPSULE STUDY N/A 10/29/2017   Procedure: GIVENS CAPSULE STUDY;  Surgeon: Charlott Rakes, MD;  Location: [facility] ENDOSCOPY;  Service: Endoscopy;  Laterality: N/A;   GIVENS CAPSULE STUDY N/A 12/24/2017   Procedure: GIVENS CAPSULE STUDY;  Surgeon: Lemar Lofty., MD;  Location: [facility] ENDOSCOPY;  Service: Gastroenterology;  Laterality: N/A;   INSERTION OF DIALYSIS CATHETER Right 04/07/2016   Procedure: INSERTION OF DIALYSIS CATHETER;  Surgeon: Maeola Harman, MD;  Location: [facility] OR;  Service: Vascular;  Laterality: Right;   INSERTION OF DIALYSIS CATHETER Right 04/04/2017   Procedure: INSERTION OF DIALYSIS CATHETER;  Surgeon: Maeola Harman, MD;  Location: [facility] OR;  Service: Vascular;  Laterality: Right;   LIGATION OF  ARTERIOVENOUS  FISTULA Left 04/14/2016   Procedure: LIGATION OF ARTERIOVENOUS  FISTULA;  Surgeon: Chuck Hint, MD;  Location: [facility] OR;  Service: Vascular;  Laterality: Left;   PATCH ANGIOPLASTY Left 06/19/2016   Procedure: PATCH ANGIOPLASTY LEFT ARTERIOVENOUS FISTULA;  Surgeon: Chuck Hint, MD;  Location: [facility] OR;  Service: Vascular;  Laterality: Left;   POLYPECTOMY  09/04/2017   Procedure: POLYPECTOMY;  Surgeon: Kerin Salen, MD;  Location: [facility] ENDOSCOPY;  Service: Gastroenterology;;   REVISON OF ARTERIOVENOUS FISTULA Left 06/19/2016   Procedure: REVISION SUPERFICIALIZATION OF BRACHIOCEPHALIC ARTERIOVENOUS FISTULA;  Surgeon: Chuck Hint, MD;  Location: [facility] OR;  Service: Vascular;  Laterality: Left;   Social History   Socioeconomic History   Marital status: Single    Spouse  name: Not on file   Number of children: Not on file   Years of education: Not on file   Highest education level: Not on file  Occupational History   Occupation: disabled  Social Designer, fashion/clothing strain: Not on file   Food insecurity:    Worry: Not on file    Inability: Not on file   Transportation needs:    Medical: Not on file    Non-medical: Not on file  Tobacco Use   Smoking status: Never Smoker   Smokeless tobacco: Never Used  Substance and Sexual Activity   Alcohol use: No   Drug use: No   Sexual activity: Not Currently    Birth control/protection: Surgical  Lifestyle   Physical activity:    Days per week: Not on file    Minutes per session: Not on file   Stress: Not on file  Relationships   Social connections:    Talks on phone: Not on file    Gets together: Not on file    Attends religious service: Not on file    Active member of club or organization: Not on file    Attends meetings of clubs or organizations: Not on file    Relationship status: Not on file   Intimate partner violence:    Fear of current or ex partner: No    Emotionally  abused: No    Physically abused: No    Forced sexual activity: No  Other Topics Concern   Not on file  Social History Narrative   Not on file   Family History  Problem Relation Age of Onset   Hypertension Mother    Thyroid disease Mother    Coronary artery disease Father    Hypertension Father    Diabetes Father    Colon cancer Maternal Aunt    Esophageal cancer Maternal Uncle    Inflammatory bowel disease Neg Hx    Liver disease Neg Hx    Pancreatic cancer Neg Hx    Rectal cancer Neg Hx    Stomach cancer Neg Hx    I have reviewed her medical, social, and family history in detail and updated the electronic medical record as necessary.    PHYSICAL EXAMINATION  This was a telehealth visit  REVIEW OF DATA  I reviewed the following data at the time of this encounter:  GI Procedures and Studies  February 2020 enteroscopy - No gross lesions in esophagus. Biopsied. - 3 cm hiatal hernia. Gastroesophageal flap valve classified as Hill Grade IV (no fold, wide open lumen, hiatal hernia present). - No gross lesions in the stomach. Previous ulcer has healed. - No gross lesions in the duodenal bulb, in the first portion of the duodenum, in the second portion of the duodenum, in the third portion of the duodenum and in the fourth portion of the duodenum. Prominent Ampulla. - Normal mucosa was found in the proximal jejunum. Tattooed distal most extent of enteroscopy today.  December 2019 video capsule endoscopy   Laboratory Studies  Reviewed in epic  Imaging Studies  February 2020 CT abdomen pelvis IMPRESSION: 1. Trace abdominal ascites. Small volume pneumoperitoneum in the greater and lesser peritoneal spaces. Likely etiology of pneumoperitoneum and ascites is peritoneal dialysis. Tenckhoff type peritoneal dialysis catheter is positioned with tip coiled in the midline low abdomen. 2. Occasional sigmoid diverticula with high-density stool balls in the distal  colon and rectum. No findings to suggest diverticulitis or other etiology of bowel perforation.  ASSESSMENT  Ms. Obremski is a 43 y.o. 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  The patient is seen today for evaluation and management of:  1. AVM (arteriovenous malformation) of small bowel, acquired   2. Dark stools   3. History of peritonitis   4. Non-intractable vomiting with nausea, unspecified vomiting type   5. Early satiety    The patient seems to be hemodynamically stable but overall is having abdominal discomfort reminiscent to her of her prior peritonitis.  At this point in time I would like to further evaluate the dark stools that she is reporting once again.  If she is developing a worsening anemia then we will need to consider either a repeat capsule endoscopy to try and further evaluate the rest of the small bowel that was not evaluated versus a device assisted enteroscopy from above.  This may need to be done at another facility if she is truly developing recurrent anemia.  I am most concerned about the possibility of underlying peritonitis she is in the process of trying to reach out to her nephrologist team so she can see how she can have her fluid sampled.  We will get a CBC tomorrow as long as she stays stable.  I would normally consider trying to repeat the gastric emptying study but I am concerned that she would not tolerate it as it seems she has not tolerated it already.  We may consider the role of Reglan use in the future for empiric treatment but ideally I would like to hold on that.  The risks and benefits of endoscopic evaluation were discussed with the patient; these include but are not limited to the risk of perforation, infection, bleeding, missed lesions, lack of diagnosis, severe illness requiring hospitalization, as well as anesthesia and  sedation related illnesses.  The patient is agreeable to proceed.  Follow-up will be dictated based on the labs as well as the stool studies to rule out the possibility of underlying C. difficile in the setting of recently being on antibiotics.   PLAN  CBC to be drawn Stool studies as outlined below If dropping hemoglobin will need to consider urgent capsule endoscopy versus possible transfer or inpatient evaluation  I think a repeat capsule endoscopy will be helpful in the future so that we can further dictate what we may be missing in the rest of the small bowel though the 2 angiectasia that were noted previously I think an upper device assisted enteroscopy may be helpful at least to reach those if that is the issue. Holding on his repeat solid-phase gastric emptying study for now Continue Epogen as per [facility] She is to reach out to [facility] to see how they can potentially sample her fluid in her abdomen to send off for possible recurrent peritonitis We will see what her CBC shows tomorrow in case she is having a progressive leukocytosis that may be concerning for that in addition to the evaluation for pneumonia Follow-up will be dictated by her labs and her stool studies    Orders Placed This Encounter  Procedures   Stool Culture   Ova and parasite examination   Stool C-Diff Toxin Assay   CBC   Fecal lactoferrin, quant   Clostridium difficile Toxin B, Qualitative, Real-Time PCR    New Prescriptions   No medications on file   Modified Medications   No medications on file  Planned Follow Up: No follow-ups on file.   Justice Britain, MD [facility] Advanced Endoscopy Office # [phone]

## 2018-04-03 ENCOUNTER — Other Ambulatory Visit: Payer: Self-pay

## 2018-04-03 ENCOUNTER — Inpatient Hospital Stay (HOSPITAL_COMMUNITY)
Admission: EM | Admit: 2018-04-03 | Discharge: 2018-04-07 | DRG: 377 | Disposition: A | Discharge status: Home Health | Payer: Medicare Other | Attending: Family Medicine | Admitting: Family Medicine

## 2018-04-03 ENCOUNTER — Encounter (HOSPITAL_COMMUNITY): Payer: Self-pay

## 2018-04-03 DIAGNOSIS — E785 Hyperlipidemia, unspecified: Secondary | ICD-10-CM | POA: Diagnosis present

## 2018-04-03 DIAGNOSIS — E876 Hypokalemia: Secondary | ICD-10-CM | POA: Diagnosis not present

## 2018-04-03 DIAGNOSIS — Z9221 Personal history of antineoplastic chemotherapy: Secondary | ICD-10-CM

## 2018-04-03 DIAGNOSIS — K219 Gastro-esophageal reflux disease without esophagitis: Secondary | ICD-10-CM | POA: Diagnosis present

## 2018-04-03 DIAGNOSIS — Z888 Allergy status to other drugs, medicaments and biological substances status: Secondary | ICD-10-CM

## 2018-04-03 DIAGNOSIS — R0602 Shortness of breath: Secondary | ICD-10-CM

## 2018-04-03 DIAGNOSIS — D649 Anemia, unspecified: Secondary | ICD-10-CM

## 2018-04-03 DIAGNOSIS — Z8249 Family history of ischemic heart disease and other diseases of the circulatory system: Secondary | ICD-10-CM

## 2018-04-03 DIAGNOSIS — F419 Anxiety disorder, unspecified: Secondary | ICD-10-CM | POA: Diagnosis present

## 2018-04-03 DIAGNOSIS — R197 Diarrhea, unspecified: Secondary | ICD-10-CM

## 2018-04-03 DIAGNOSIS — K659 Peritonitis, unspecified: Secondary | ICD-10-CM | POA: Diagnosis present

## 2018-04-03 DIAGNOSIS — D62 Acute posthemorrhagic anemia: Secondary | ICD-10-CM | POA: Diagnosis present

## 2018-04-03 DIAGNOSIS — I120 Hypertensive chronic kidney disease with stage 5 chronic kidney disease or end stage renal disease: Secondary | ICD-10-CM | POA: Diagnosis present

## 2018-04-03 DIAGNOSIS — M1990 Unspecified osteoarthritis, unspecified site: Secondary | ICD-10-CM | POA: Diagnosis present

## 2018-04-03 DIAGNOSIS — N186 End stage renal disease: Secondary | ICD-10-CM | POA: Diagnosis present

## 2018-04-03 DIAGNOSIS — R1084 Generalized abdominal pain: Secondary | ICD-10-CM

## 2018-04-03 DIAGNOSIS — E7800 Pure hypercholesterolemia, unspecified: Secondary | ICD-10-CM | POA: Diagnosis present

## 2018-04-03 DIAGNOSIS — E8889 Other specified metabolic disorders: Secondary | ICD-10-CM | POA: Diagnosis present

## 2018-04-03 DIAGNOSIS — R110 Nausea: Secondary | ICD-10-CM | POA: Diagnosis not present

## 2018-04-03 DIAGNOSIS — D631 Anemia in chronic kidney disease: Secondary | ICD-10-CM | POA: Diagnosis present

## 2018-04-03 DIAGNOSIS — Z6841 Body Mass Index (BMI) 40.0 and over, adult: Secondary | ICD-10-CM

## 2018-04-03 DIAGNOSIS — K5521 Angiodysplasia of colon with hemorrhage: Principal | ICD-10-CM | POA: Diagnosis present

## 2018-04-03 DIAGNOSIS — M1A9XX0 Chronic gout, unspecified, without tophus (tophi): Secondary | ICD-10-CM | POA: Diagnosis present

## 2018-04-03 DIAGNOSIS — Z992 Dependence on renal dialysis: Secondary | ICD-10-CM

## 2018-04-03 DIAGNOSIS — I330 Acute and subacute infective endocarditis: Secondary | ICD-10-CM | POA: Diagnosis present

## 2018-04-03 DIAGNOSIS — Z79899 Other long term (current) drug therapy: Secondary | ICD-10-CM

## 2018-04-03 LAB — CBC WITH DIFFERENTIAL/PLATELET
Abs Immature Granulocytes: 0.08 10*3/uL — ABNORMAL HIGH (ref 0.00–0.07)
BASOS PCT: 0 %
Basophils Absolute: 0 10*3/uL (ref 0.0–0.1)
Eosinophils Absolute: 0.2 10*3/uL (ref 0.0–0.5)
Eosinophils Relative: 2 %
HCT: 27.7 % — ABNORMAL LOW (ref 36.0–46.0)
Hemoglobin: 8.9 g/dL — ABNORMAL LOW (ref 12.0–15.0)
IMMATURE GRANULOCYTES: 1 %
Lymphocytes Relative: 17 %
Lymphs Abs: 1.5 10*3/uL (ref 0.7–4.0)
MCH: 28.5 pg (ref 26.0–34.0)
MCHC: 32.1 g/dL (ref 30.0–36.0)
MCV: 88.8 fL (ref 80.0–100.0)
Monocytes Absolute: 0.6 10*3/uL (ref 0.1–1.0)
Monocytes Relative: 7 %
NEUTROS PCT: 73 %
Neutro Abs: 6.9 10*3/uL (ref 1.7–7.7)
Platelets: 227 10*3/uL (ref 150–400)
RBC: 3.12 MIL/uL — ABNORMAL LOW (ref 3.87–5.11)
RDW: 16.5 % — AB (ref 11.5–15.5)
WBC: 9.3 10*3/uL (ref 4.0–10.5)
nRBC: 0.5 % — ABNORMAL HIGH (ref 0.0–0.2)

## 2018-04-03 LAB — COMPREHENSIVE METABOLIC PANEL
ALT: 14 U/L (ref 0–44)
AST: 17 U/L (ref 15–41)
Albumin: 2.9 g/dL — ABNORMAL LOW (ref 3.5–5.0)
Alkaline Phosphatase: 57 U/L (ref 38–126)
Anion gap: 20 — ABNORMAL HIGH (ref 5–15)
BUN: 67 mg/dL — ABNORMAL HIGH (ref 6–20)
CHLORIDE: 99 mmol/L (ref 98–111)
CO2: 18 mmol/L — ABNORMAL LOW (ref 22–32)
Calcium: 7.2 mg/dL — ABNORMAL LOW (ref 8.9–10.3)
Creatinine, Ser: 16.88 mg/dL — ABNORMAL HIGH (ref 0.44–1.00)
GFR calc Af Amer: 3 mL/min — ABNORMAL LOW (ref >60)
GFR calc non Af Amer: 2 mL/min — ABNORMAL LOW (ref >60)
Glucose, Bld: 120 mg/dL — ABNORMAL HIGH (ref 70–99)
POTASSIUM: 3.7 mmol/L (ref 3.5–5.1)
Sodium: 137 mmol/L (ref 135–145)
Total Bilirubin: 0.8 mg/dL (ref 0.3–1.2)
Total Protein: 6.7 g/dL (ref 6.5–8.1)

## 2018-04-03 LAB — URINALYSIS, ROUTINE W REFLEX MICROSCOPIC
Bacteria, UA: NONE SEEN
Bilirubin Urine: NEGATIVE
Glucose, UA: >=500 mg/dL — AB
Hgb urine dipstick: NEGATIVE
Ketones, ur: NEGATIVE mg/dL
LEUKOCYTE UA: NEGATIVE
Nitrite: NEGATIVE
PH: 8 (ref 5.0–8.0)
Protein, ur: 100 mg/dL — AB
Specific Gravity, Urine: 1.006 (ref 1.005–1.030)

## 2018-04-03 LAB — LACTIC ACID, PLASMA: Lactic Acid, Venous: 1.5 mmol/L (ref 0.5–1.9)

## 2018-04-03 LAB — LIPASE, BLOOD: Lipase: 36 U/L (ref 11–51)

## 2018-04-03 MED ORDER — DELFLEX-LC/1.5% DEXTROSE 344 MOSM/L IP SOLN: INTRAPERITONEAL | Status: DC

## 2018-04-03 MED ORDER — CINACALCET HCL 30 MG PO TABS: 30.0000 mg | ORAL_TABLET | Freq: Every day | ORAL | Status: DC

## 2018-04-03 MED ORDER — ONDANSETRON 4 MG PO TBDP
4.0000 mg | ORAL_TABLET | Freq: Once | ORAL | Status: DC
  Filled 2018-04-03: qty 1

## 2018-04-03 MED ORDER — DELFLEX-LC/2.5% DEXTROSE 394 MOSM/L IP SOLN: INTRAPERITONEAL | Status: DC

## 2018-04-03 MED ORDER — ATORVASTATIN CALCIUM 40 MG PO TABS
40.0000 mg | ORAL_TABLET | Freq: Every day | ORAL | Status: DC
  Administered 2018-04-04 – 2018-04-06 (×4): 40 mg via ORAL
  Filled 2018-04-03 (×4): qty 1

## 2018-04-03 MED ORDER — HEPARIN 1000 UNIT/ML FOR PERITONEAL DIALYSIS: 500.0000 [IU] | INTRAMUSCULAR | Status: DC | PRN

## 2018-04-03 MED ORDER — PROMETHAZINE HCL 25 MG PO TABS
25.0000 mg | ORAL_TABLET | Freq: Three times a day (TID) | ORAL | Status: DC | PRN
  Administered 2018-04-04 – 2018-04-07 (×8): 25 mg via ORAL
  Filled 2018-04-03 (×9): qty 1

## 2018-04-03 MED ORDER — CALCITRIOL 0.5 MCG PO CAPS
0.5000 ug | ORAL_CAPSULE | Freq: Every day | ORAL | Status: DC
  Administered 2018-04-04: 0.5 ug via ORAL
  Filled 2018-04-03: qty 1

## 2018-04-03 MED ORDER — HYDROXYZINE HCL 25 MG PO TABS
25.0000 mg | ORAL_TABLET | Freq: Four times a day (QID) | ORAL | Status: DC | PRN
  Administered 2018-04-05 – 2018-04-06 (×5): 25 mg via ORAL
  Filled 2018-04-03 (×6): qty 1

## 2018-04-03 MED ORDER — SUCROFERRIC OXYHYDROXIDE 500 MG PO CHEW
1000.0000 mg | CHEWABLE_TABLET | Freq: Three times a day (TID) | ORAL | Status: DC
  Administered 2018-04-04 – 2018-04-07 (×10): 1000 mg via ORAL
  Filled 2018-04-03 (×13): qty 2

## 2018-04-03 MED ORDER — HYDROMORPHONE HCL 2 MG PO TABS
1.0000 mg | ORAL_TABLET | ORAL | Status: DC | PRN
  Administered 2018-04-04 – 2018-04-05 (×6): 1 mg via ORAL
  Filled 2018-04-03 (×6): qty 1

## 2018-04-03 MED ORDER — ONDANSETRON HCL 4 MG/2ML IJ SOLN
4.0000 mg | Freq: Once | INTRAMUSCULAR | Status: AC
  Administered 2018-04-03: 4 mg via INTRAVENOUS
  Filled 2018-04-03: qty 2

## 2018-04-03 MED ORDER — GENTAMICIN SULFATE 0.1 % EX CREA
1.0000 "application " | TOPICAL_CREAM | Freq: Every day | CUTANEOUS | Status: DC
  Administered 2018-04-04 – 2018-04-07 (×5): 1 via TOPICAL
  Filled 2018-04-03: qty 15

## 2018-04-03 MED ORDER — ALPRAZOLAM 0.25 MG PO TABS
1.0000 mg | ORAL_TABLET | Freq: Two times a day (BID) | ORAL | Status: DC | PRN
  Administered 2018-04-04 – 2018-04-07 (×5): 1 mg via ORAL
  Filled 2018-04-03 (×5): qty 4

## 2018-04-03 MED ORDER — PANTOPRAZOLE SODIUM 40 MG PO TBEC
80.0000 mg | DELAYED_RELEASE_TABLET | Freq: Every day | ORAL | Status: DC
  Administered 2018-04-04 – 2018-04-07 (×4): 80 mg via ORAL
  Filled 2018-04-03 (×4): qty 2

## 2018-04-03 MED ORDER — METOPROLOL TARTRATE 25 MG PO TABS
25.0000 mg | ORAL_TABLET | Freq: Every day | ORAL | Status: DC
  Administered 2018-04-04 – 2018-04-06 (×4): 25 mg via ORAL
  Filled 2018-04-03 (×4): qty 1

## 2018-04-03 MED ORDER — HYDROMORPHONE HCL 1 MG/ML IJ SOLN
1.0000 mg | Freq: Once | INTRAMUSCULAR | Status: AC
  Administered 2018-04-03: 1 mg via INTRAVENOUS
  Filled 2018-04-03: qty 1

## 2018-04-03 MED ORDER — SODIUM CHLORIDE 0.9 % IV BOLUS
500.0000 mL | Freq: Once | INTRAVENOUS | Status: AC
  Administered 2018-04-03: 500 mL via INTRAVENOUS

## 2018-04-03 MED ORDER — RENA-VITE PO TABS
1.0000 | ORAL_TABLET | Freq: Every day | ORAL | Status: DC
  Administered 2018-04-04 – 2018-04-06 (×4): 1 via ORAL
  Filled 2018-04-03 (×4): qty 1

## 2018-04-03 MED ORDER — HEPARIN SODIUM (PORCINE) 5000 UNIT/ML IJ SOLN
5000.0000 [IU] | Freq: Three times a day (TID) | INTRAMUSCULAR | Status: DC
  Administered 2018-04-04 – 2018-04-07 (×10): 5000 [IU] via SUBCUTANEOUS
  Filled 2018-04-03 (×9): qty 1

## 2018-04-03 MED ORDER — ALLOPURINOL 100 MG PO TABS
100.0000 mg | ORAL_TABLET | Freq: Every day | ORAL | Status: DC
  Administered 2018-04-04 – 2018-04-06 (×4): 100 mg via ORAL
  Filled 2018-04-03 (×4): qty 1

## 2018-04-03 MED ORDER — VENLAFAXINE HCL ER 75 MG PO CP24
75.0000 mg | ORAL_CAPSULE | Freq: Every day | ORAL | Status: DC
  Administered 2018-04-04 – 2018-04-06 (×4): 75 mg via ORAL
  Filled 2018-04-03 (×4): qty 1

## 2018-04-03 NOTE — ED Notes (Signed)
ED TO INPATIENT HANDOFF REPORT  ED Nurse Name and Phone #: Callie Fielding [phone]  S Name/Age/Gender Patricia [name] 43 y.o. female Room/Bed: 028C/028C  Code Status   Code Status: Prior  Home/SNF/Other Home Patient oriented to: self, place, time and situation Is this baseline? Yes   Triage Complete: Triage complete  Chief Complaint stomach pain   Triage Note Pt here with 2 days worsening abdominal pain surrounding peritoneal catheter. Sees [facility] and states they told her WBC count was elevated and to come to ER for further eval. Pt also endorses fevers and diarrhea.   Allergies Allergies  Allergen Reactions  . Nsaids Other (See Comments)    Cannot take due to Kidney disease/Kidney function   . Tolmetin Other (See Comments)    Cannot take due to Kidney Disease    Level of Care/Admitting Diagnosis ED Disposition    ED Disposition Condition [place]: [place] [[id number]]  Level of Care: Med-Surg [16]  Diagnosis: Peritonitis [facility]) [[id number]]  Admitting Physician: Matilde Haymaker [[id number]]  Attending Physician: Owens Shark, CARINA Jerilynn Mages [[id number]]  PT Class (Do Not Modify): Observation [104]  PT Acc Code (Do Not Modify): Observation [10022]       B Medical/Surgery History Past Medical History:  Diagnosis Date  . Acid reflux   . Anxiety   . Arthritis   . Dyspnea   . Dysrhythmia    tachycardia  . ESRD (end stage renal disease) ([place])    [facility]S[place]- [place]  . GI bleeding 12/2017  . Gout   . Headache(784.0)    "related to chemo; sometimes weekly" (09/12/2013)  . High cholesterol   . History of blood transfusion "a couple"   "related to low counts"  . Hypertension   . Iron deficiency anemia    "get epogen shots q month" (02/20/2014)  . 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 Peritoneal dialysis status [facility])    Past Surgical History:  Procedure Laterality Date  .  ABDOMINAL HYSTERECTOMY  2010   "laparoscopic"  . ANKLE FRACTURE SURGERY Right 1994  . AV FISTULA PLACEMENT Left 04/14/2016   Procedure: ARTERIOVENOUS (AV) FISTULA CREATION;  Surgeon: Angelia Mould, MD;  Location: [facility] OR;  Service: Vascular;  Laterality: Left;  . AV FISTULA PLACEMENT Left 08/09/2017   Procedure: INSERTION OF ARTERIOVENOUS (AV) GORE-TEX GRAFT LEFT ARM;  Surgeon: Elam Dutch, MD;  Location: [place];  Service: Vascular;  Laterality: Left;  . [place] REMOVAL Left 08/11/2017   Procedure: REMOVAL OF ARTERIOVENOUS GORETEX GRAFT ([place]);  Surgeon: Serafina Mitchell, MD;  Location: [facility] OR;  Service: Vascular;  Laterality: Left;  . BIOPSY  09/03/2017   Procedure: BIOPSY;  Surgeon: Otis Brace, MD;  Location: [place];  Service: Gastroenterology;;  . BIOPSY  12/24/2017   Procedure: BIOPSY;  Surgeon: Irving Copas., MD;  Location: [place];  Service: Gastroenterology;;  . CARDIAC CATHETERIZATION  2000's  . COLONOSCOPY WITH PROPOFOL N/A 09/04/2017   Procedure: COLONOSCOPY WITH PROPOFOL;  Surgeon: Ronnette Juniper, MD;  Location: [place];  Service: Gastroenterology;  Laterality: N/A;  . ENTEROSCOPY N/A 12/24/2017   Procedure: ENTEROSCOPY;  Surgeon: Rush Landmark Telford Nab., MD;  Location: [place];  Service: Gastroenterology;  Laterality: N/A;  . ESOPHAGOGASTRODUODENOSCOPY    . ESOPHAGOGASTRODUODENOSCOPY (EGD) WITH PROPOFOL Left 06/04/2017   Procedure: ESOPHAGOGASTRODUODENOSCOPY (EGD) WITH PROPOFOL;  Surgeon: Arta Silence, MD;  Location: [place];  Service: Endoscopy;  Laterality: Left;  . ESOPHAGOGASTRODUODENOSCOPY (EGD) WITH PROPOFOL N/A 09/03/2017   Procedure: ESOPHAGOGASTRODUODENOSCOPY (EGD) WITH PROPOFOL;  Surgeon: Otis Brace, MD;  Location: [place];  Service: Gastroenterology;  Laterality: N/A;  . FISTULA SUPERFICIALIZATION Left 04/02/2017   Procedure: FISTULA SUPERFICIALIZATION LEFT ARM;  Surgeon: Serafina Mitchell, MD;  Location: [place];   Service: Vascular;  Laterality: Left;  . FISTULOGRAM Left 04/07/2016   Procedure: FISTULOGRAM;  Surgeon: Waynetta Sandy, MD;  Location: [place];  Service: Vascular;  Laterality: Left;  . FRACTURE SURGERY     right ankle  . GIVENS CAPSULE STUDY N/A 10/29/2017   Procedure: GIVENS CAPSULE STUDY;  Surgeon: Wilford Corner, MD;  Location: [place];  Service: Endoscopy;  Laterality: N/A;  . GIVENS CAPSULE STUDY N/A 12/24/2017   Procedure: GIVENS CAPSULE STUDY;  Surgeon: Irving Copas., MD;  Location: [place];  Service: Gastroenterology;  Laterality: N/A;  . INSERTION OF DIALYSIS CATHETER Right 04/07/2016   Procedure: INSERTION OF DIALYSIS CATHETER;  Surgeon: Waynetta Sandy, MD;  Location: [place];  Service: Vascular;  Laterality: Right;  . INSERTION OF DIALYSIS CATHETER Right 04/04/2017   Procedure: INSERTION OF DIALYSIS CATHETER;  Surgeon: Waynetta Sandy, MD;  Location: [place];  Service: Vascular;  Laterality: Right;  . LIGATION OF ARTERIOVENOUS  FISTULA Left 04/14/2016   Procedure: LIGATION OF ARTERIOVENOUS  FISTULA;  Surgeon: Angelia Mould, MD;  Location: [place];  Service: Vascular;  Laterality: Left;  . PATCH ANGIOPLASTY Left 06/19/2016   Procedure: PATCH ANGIOPLASTY LEFT ARTERIOVENOUS FISTULA;  Surgeon: Angelia Mould, MD;  Location: [place];  Service: Vascular;  Laterality: Left;  . POLYPECTOMY  09/04/2017   Procedure: POLYPECTOMY;  Surgeon: Ronnette Juniper, MD;  Location: [facility] ENDOSCOPY;  Service: Gastroenterology;;  . REVISON OF ARTERIOVENOUS FISTULA Left 06/19/2016   Procedure: REVISION SUPERFICIALIZATION OF BRACHIOCEPHALIC ARTERIOVENOUS FISTULA;  Surgeon: Angelia Mould, MD;  Location: [place];  Service: Vascular;  Laterality: Left;     A IV Location/Drains/Wounds Patient Lines/Drains/Airways Status   Active Line/Drains/Airways    None          Intake/Output Last 24 hours  Intake/Output Summary (Last 24 hours) at 04/03/2018  2338 Last data filed at 04/03/2018 2224 Gross per 24 hour  Intake 30 ml  Output -  Net 30 ml    Labs/Imaging Results for orders placed or performed during the hospital encounter of 04/03/18 (from the past 48 hour(s))  CBC with Differential     Status: Abnormal   Collection Time: 04/03/18  8:55 PM  Result Value Ref Range   WBC 9.3 4.0 - 10.5 K/uL   RBC 3.12 (L) 3.87 - 5.11 MIL/uL   Hemoglobin 8.9 (L) 12.0 - 15.0 g/dL   HCT 27.7 (L) 36.0 - 46.0 %   MCV 88.8 80.0 - 100.0 fL   MCH 28.5 26.0 - 34.0 pg   MCHC 32.1 30.0 - 36.0 g/dL   RDW 16.5 (H) 11.5 - 15.5 %   Platelets 227 150 - 400 K/uL   nRBC 0.5 (H) 0.0 - 0.2 %   Neutrophils Relative % 73 %   Neutro Abs 6.9 1.7 - 7.7 K/uL   Lymphocytes Relative 17 %   Lymphs Abs 1.5 0.7 - 4.0 K/uL   Monocytes Relative 7 %   Monocytes Absolute 0.6 0.1 - 1.0 K/uL   Eosinophils Relative 2 %   Eosinophils Absolute 0.2 0.0 - 0.5 K/uL   Basophils Relative 0 %   Basophils Absolute 0.0 0.0 - 0.1 K/uL  Immature Granulocytes 1 %   Abs Immature Granulocytes 0.08 (H) 0.00 - 0.07 K/uL    Comment: Performed at [facility], [place] [address], [place], [place] [address]  Comprehensive metabolic panel     Status: Abnormal   Collection Time: 04/03/18  8:55 PM  Result Value Ref Range   Sodium 137 135 - 145 mmol/L   Potassium 3.7 3.5 - 5.1 mmol/L   Chloride 99 98 - 111 mmol/L   CO2 18 (L) 22 - 32 mmol/L   Glucose, Bld 120 (H) 70 - 99 mg/dL   BUN 67 (H) 6 - 20 mg/dL   Creatinine, Ser 16.88 (H) 0.44 - 1.00 mg/dL   Calcium 7.2 (L) 8.9 - 10.3 mg/dL   Total Protein 6.7 6.5 - 8.1 g/dL   Albumin 2.9 (L) 3.5 - 5.0 g/dL   AST 17 15 - 41 U/L   ALT 14 0 - 44 U/L   Alkaline Phosphatase 57 38 - 126 U/L   Total Bilirubin 0.8 0.3 - 1.2 mg/dL   GFR calc non Af Amer 2 (L) >60 mL/min   GFR calc Af Amer 3 (L) >60 mL/min   Anion gap 20 (H) 5 - 15    Comment: Performed at [facility], [place] [address], [place], [place] [address]  Lipase, blood     Status:  None   Collection Time: 04/03/18  8:55 PM  Result Value Ref Range   Lipase 36 11 - 51 U/L    Comment: Performed at [facility] [address] [place], [place] [address]  Lactic acid, plasma     Status: None   Collection Time: 04/03/18  8:55 PM  Result Value Ref Range   Lactic Acid, Venous 1.5 0.5 - 1.9 mmol/L    Comment: Performed at [facility] [address], [place], [place] [address]  Urinalysis, Routine w reflex microscopic     Status: Abnormal   Collection Time: 04/03/18 11:19 PM  Result Value Ref Range   Color, Urine COLORLESS (A) YELLOW   APPearance CLEAR CLEAR   Specific Gravity, Urine 1.006 1.005 - 1.030   pH 8.0 5.0 - 8.0   Glucose, UA >=500 (A) NEGATIVE mg/dL   Hgb urine dipstick NEGATIVE NEGATIVE   Bilirubin Urine NEGATIVE NEGATIVE   Ketones, ur NEGATIVE NEGATIVE mg/dL   Protein, ur 100 (A) NEGATIVE mg/dL   Nitrite NEGATIVE NEGATIVE   Leukocytes,Ua NEGATIVE NEGATIVE   RBC / HPF 0-5 0 - 5 RBC/hpf   WBC, UA 0-5 0 - 5 WBC/hpf   Bacteria, UA NONE SEEN NONE SEEN    Comment: Performed at [facility] [address], [place], [place] [address]   No results found.  Pending Labs Unresulted Labs (From admission, onward)    Start     Ordered   04/03/18 2235  Body fluid culture  Once,   R    Comments:  On daytime exchange   Question:  Are there also cytology or pathology orders on this specimen?  Answer:  No   04/03/18 2234   04/03/18 2235  Body fluid cell count with differential  Once,   R    Comments:  On daytime exchange   Question:  Are there also cytology or pathology orders on this specimen?  Answer:  No   04/03/18 2234   04/03/18 2032  Blood culture (routine x 2)  BLOOD CULTURE X 2,   STAT     04/03/18 2031  04/03/18 2031  Lactic acid, plasma  Now then every 2 hours,   STAT     04/03/18 2031   04/03/18 2031  Urine culture  ONCE - STAT,   STAT     04/03/18 2031          Vitals/Pain Today's Vitals   04/03/18 2100 04/03/18 2115  04/03/18 2233 04/03/18 2245  BP: (!) 155/89 (!) 148/69 (!) 145/92 (!) 145/86  Pulse: 87 90 84 87  Resp: 17 (!) 27 19 (!) 25  Temp:      TempSrc:      SpO2: 99% 99% 100% 95%  Weight:      Height:      PainSc:        Isolation Precautions No active isolations  Medications Medications  ondansetron (ZOFRAN-ODT) disintegrating tablet 4 mg (4 mg Oral Refused 04/03/18 2235)  heparin 1000 unit/ml injection 500 Units (has no administration in time range)  gentamicin cream (GARAMYCIN) 0.1 % 1 application (has no administration in time range)  dialysis solution 1.5% low-MG/low-CA dianeal solution (has no administration in time range)  dialysis solution 2.5% low-MG/low-CA dianeal solution (has no administration in time range)  dialysis solution 1.5% low-MG/low-CA dianeal solution (has no administration in time range)  dialysis solution 2.5% low-MG/low-CA dianeal solution (has no administration in time range)  HYDROmorphone (DILAUDID) injection 1 mg (1 mg Intravenous Given 04/03/18 2159)  ondansetron (ZOFRAN) injection 4 mg (4 mg Intravenous Given 04/03/18 2159)  sodium chloride 0.9 % bolus 500 mL (0 mLs Intravenous Stopped 04/03/18 2224)    Mobility walks Low fall risk   Focused Assessments Renal Assessment Handoff:  Hemodialysis Schedule: perioneal dialysis Last Hemodialysis date and time: every day at home   Restricted appendage: left arm     R Recommendations: See Admitting Provider Note  Report given to:   Additional Notes:

## 2018-04-03 NOTE — ED Triage Notes (Signed)
Pt here with 2 days worsening abdominal pain surrounding peritoneal catheter. Sees [facility] and states they told her WBC count was elevated and to come to ER for further eval. Pt also endorses fevers and diarrhea.

## 2018-04-03 NOTE — H&P (Addendum)
[facility] Admission History and Physical Service Pager: [phone]  [name] Medical record number: [id number] Date of birth: [birth year] Age: 43 y.o. Gender: female  Primary Care Provider: Blair Heys, PA-C Consultants: Nephrology Code Status: Full  Chief Complaint: Stomach pain for 3 days  Assessment and Plan: Patricia [name] is a 43 y.o. female presenting with 3 days of abdominal pain.  Past medical history is significant for ESRD on peritoneal dialysis, recent peritonitis, hypertension, paroxysmal atrial tachycardia, arteriovenous malformation of the small bowel.  Abdominal pain She reports 3 days of sharp/crampy-like abdominal pain starting in the left lower quadrant and radiating to the center of her lower abdomen.  This pain appears to be accompanied by nausea without vomiting and diarrhea.  She has had no true fevers at home or in the ED.  On admission, her vitals were notable for tachypnea to 27, hypertension to 155/89.  Her physical exam was notable for diffuse abdominal tenderness greater on the left than the right.  Admission labs are remarkable for bicarb 18, BUN 67, creatinine 16.8, anion gap 20, albumin 2.9, WBC 9.3, UA with glucose greater than 500 and protein 100.  No imaging was performed on admission.  The differential for her abdominal pain is time includes: Peritonitis, peritoneal abscess, diverticulitis, gastroenteritis.  Nephrology has already been consulted thinks it is likely that she is experiencing peritonitis though, based on her history of recently treated peritonitis, it is prudent to ensure she is, in fact having peritonitis again.  She was discharged from the hospital on 2/23 after her last treatment for peritonitis.  Recurrence of peritonitis after such a short interval suggests there may be an abscess formation that was not adequate adequately treated. Other possibilities include diverticulitis.  Previous CT abdomen  noted diverticula with fecaliths.  CT abdomen may be helpful in determining if this is presently contributing to her illness.  Her symptoms are also consistent with a gastroenteritis though given her medical history and risk factors, the above etiologies seem unlikely at this time. -Admit to [place] [facility], attending Dr. Owens Shark -Consult nephrology, appreciate recommendations -Follow-up cell count and culture of abdominal fluid -Consider abdominal CT if cell count and culture is unremarkable or if patient worsens -Continue to assess physical exam -No antibiotics at this time per nephro recs -Phenergan for nausea -Dilaudid p.o. 1 mg every 4 hours as needed  Anemia, likely secondary to acute GI bleed She has a history of intestinal AVMs with bleeding in the past requiring transfusion.  Hemoglobin was 11.6 on 3/25.  Hemoglobin has down trended to 8.9 on 3/29.  She reports frequent loose, black stools.  Based on her history, it is likely she is experiencing another GI bleed.  She may be bleeding from an AVM although bleeding from a diverticulum is also possible. No bright red blood per rectum. -Follow-up stool occult -GI consult in a.m. -Transfuse for hemoglobin under 7 -Monitor with daily CBC  ESRD on peritoneal dialysis -Consult nephrology -Continue calcitriol 0.5 mcg daily -Continue calcium and set 30 mg daily -Continue Velphoro (sucroferric oxyhydroxide) 500 mg 3 times daily with meals  Hypertension Blood pressures on admission were notable for hypertension up to 155/89.  Home medication includes metoprolol 25 mg daily. -Continue metoprolol 25 mg daily  Gout History of gout.  No flares currently. Continue allopurinol 100 mg daily  HLD -Continue atorvastatin 40 mg daily  GERD Home medication includes omeprazole 40 mg twice daily. -Continue pantoprazole 80 mg daily  Anxiety Home medications  include venlafaxine 75 mg daily Xanax 1 mg 1-2 times daily as needed. -Continue venlafaxine Exar  75 mg daily -Continue Xanax 1 mg twice daily as needed  FEN/GI: Renal diet Prophylaxis: Subcu heparin  Disposition: 2-3 days of hospitalization anticipated before discharge.  History of Present Illness:   Patricia [name] is a 43 y.o. female presenting with 3 days of abdominal pain.  Past medical history is significant for ESRD on peritoneal dialysis, recent peritonitis, hypertension, paroxysmal atrial tachycardia, arteriovenous malformation of the small bowel.  She reports increasing abdominal pain for the past 3 days.  This pain is described as a sharp almost cramp-like pain starting in the left lower quadrant and radiating to the center of her lower abdomen.  The pain is accompanied by increasing nausea for the past 2 days without vomiting.  She reports that this feels very similar to her recent episode of peritonitis.  She has been taking her temperature at home and has measured 99.1 and 100.1.  She denies any readings over 100.4.  She decided to come to the hospital today as her pain is getting increasingly worse and she was concerned that she had a recurrence of peritonitis.  On review of systems, she reports decreased energy in the past several days, an episode of heart palpitations on 2/29, diarrhea and blood in stool for the past 3 days, 5 bowel movements today.  In the ED, blood cultures and urine cultures were drawn, nephrology was consulted and assessed the patient at bedside.  Nephrology was able to address sample of her peritoneal fluid at the bedside in the ED which was sent for a cell count and culture.  No antibiotics were administered.  Review Of Systems: Per HPI with the following additions:   Review of Systems  Constitutional: Negative for chills and fever.  HENT: Negative for congestion and sore throat.   Eyes: Negative for blurred vision and double vision.  Respiratory: Negative for cough, sputum production and shortness of breath.   Cardiovascular: Negative for chest  pain, palpitations and leg swelling.  Gastrointestinal: Positive for abdominal pain, diarrhea, melena and nausea. Negative for blood in stool, constipation, heartburn and vomiting.  Musculoskeletal: Negative for myalgias.  Skin: Negative for itching.  Neurological: Negative for tremors, weakness and headaches.    Patient Active Problem List   Diagnosis Date Noted  . Peritonitis ([place]) 04/03/2018  . History of peritonitis 03/29/2018  . Peritonitis associated with peritoneal dialysis ([place]) 02/22/2018  . Early satiety 02/18/2018  . AVM (arteriovenous malformation) of small bowel, acquired 02/18/2018  . Acute gastric ulcer without hemorrhage or perforation 02/18/2018  . Dark stools 02/18/2018  . Acute GI bleeding 12/23/2017  . Symptomatic anemia 10/28/2017  . Esophageal candidiasis ([place]) 09/01/2017  . Anxiety 09/01/2017  . Stroke ([place]) 08/15/2017  . Dysphagia 08/15/2017  . Melena 06/03/2017  . Abdominal pain 06/03/2017  . Dialysis AV fistula malfunction ([place]) 04/04/2017  . ESRD on peritoneal dialysis ([place]) 04/01/2016  . Anemia due to chronic kidney disease 04/01/2016  . GERD (gastroesophageal reflux disease) 04/01/2016  . Hypomagnesemia 04/28/2015  . Acute renal failure superimposed on stage 3 chronic kidney disease ([place]) 04/12/2014  . Atypical chest pain   . Chest pain 02/19/2014  . Hypokalemia 02/19/2014  . Nephrotic syndrome 10/02/2013  . Dyslipidemia 10/02/2013  . PAT (paroxysmal atrial tachycardia) ([place]) 09/29/2013  . Normocytic anemia 06/29/2013  . Edema 06/29/2013  . Nausea without vomiting 06/29/2013  . Fever, unspecified 01/31/2013  . Glomerulonephritis 01/28/2013  . Syncope 01/28/2013  .  Shortness of breath 07/04/2011  . Morbid obesity ([place]) 07/04/2011  . HTN (hypertension) 07/04/2011  . Hypercholesterolemia 07/04/2011    Past Medical History: Past Medical History:  Diagnosis Date  . Acid reflux   . Anxiety   . Arthritis   . Dyspnea   . Dysrhythmia     tachycardia  . ESRD (end stage renal disease) ([place])    [facility]- [place]  . GI bleeding 12/2017  . Gout   . Headache(784.0)    "related to chemo; sometimes weekly" (09/12/2013)  . High cholesterol   . History of blood transfusion "a couple"   "related to low counts"  . Hypertension   . Iron deficiency anemia    "get epogen shots q month" (02/20/2014)  . 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 Peritoneal dialysis status [facility])     Past Surgical History: Past Surgical History:  Procedure Laterality Date  . ABDOMINAL HYSTERECTOMY  2010   "laparoscopic"  . ANKLE FRACTURE SURGERY Right 1994  . AV FISTULA PLACEMENT Left 04/14/2016   Procedure: ARTERIOVENOUS (AV) FISTULA CREATION;  Surgeon: Angelia Mould, MD;  Location: [facility] OR;  Service: Vascular;  Laterality: Left;  . AV FISTULA PLACEMENT Left 08/09/2017   Procedure: INSERTION OF ARTERIOVENOUS (AV) GORE-TEX GRAFT LEFT ARM;  Surgeon: Elam Dutch, MD;  Location: [place];  Service: Vascular;  Laterality: Left;  . [place] REMOVAL Left 08/11/2017   Procedure: REMOVAL OF ARTERIOVENOUS GORETEX GRAFT ([place]);  Surgeon: Serafina Mitchell, MD;  Location: [facility] OR;  Service: Vascular;  Laterality: Left;  . BIOPSY  09/03/2017   Procedure: BIOPSY;  Surgeon: Otis Brace, MD;  Location: [place];  Service: Gastroenterology;;  . BIOPSY  12/24/2017   Procedure: BIOPSY;  Surgeon: Irving Copas., MD;  Location: [place];  Service: Gastroenterology;;  . CARDIAC CATHETERIZATION  2000's  . COLONOSCOPY WITH PROPOFOL N/A 09/04/2017   Procedure: COLONOSCOPY WITH PROPOFOL;  Surgeon: Ronnette Juniper, MD;  Location: [place];  Service: Gastroenterology;  Laterality: N/A;  . ENTEROSCOPY N/A 12/24/2017   Procedure: ENTEROSCOPY;  Surgeon: Rush Landmark Telford Nab., MD;  Location: [place];  Service: Gastroenterology;  Laterality: N/A;  . ESOPHAGOGASTRODUODENOSCOPY    .  ESOPHAGOGASTRODUODENOSCOPY (EGD) WITH PROPOFOL Left 06/04/2017   Procedure: ESOPHAGOGASTRODUODENOSCOPY (EGD) WITH PROPOFOL;  Surgeon: Arta Silence, MD;  Location: [place];  Service: Endoscopy;  Laterality: Left;  . ESOPHAGOGASTRODUODENOSCOPY (EGD) WITH PROPOFOL N/A 09/03/2017   Procedure: ESOPHAGOGASTRODUODENOSCOPY (EGD) WITH PROPOFOL;  Surgeon: Otis Brace, MD;  Location: [place];  Service: Gastroenterology;  Laterality: N/A;  . FISTULA SUPERFICIALIZATION Left 04/02/2017   Procedure: FISTULA SUPERFICIALIZATION LEFT ARM;  Surgeon: Serafina Mitchell, MD;  Location: [place];  Service: Vascular;  Laterality: Left;  . FISTULOGRAM Left 04/07/2016   Procedure: FISTULOGRAM;  Surgeon: Waynetta Sandy, MD;  Location: [place];  Service: Vascular;  Laterality: Left;  . FRACTURE SURGERY     right ankle  . GIVENS CAPSULE STUDY N/A 10/29/2017   Procedure: GIVENS CAPSULE STUDY;  Surgeon: Wilford Corner, MD;  Location: [place];  Service: Endoscopy;  Laterality: N/A;  . GIVENS CAPSULE STUDY N/A 12/24/2017   Procedure: GIVENS CAPSULE STUDY;  Surgeon: Irving Copas., MD;  Location: [place];  Service: Gastroenterology;  Laterality: N/A;  . INSERTION OF DIALYSIS CATHETER Right 04/07/2016   Procedure: INSERTION OF DIALYSIS CATHETER;  Surgeon: Waynetta Sandy, MD;  Location: [place];  Service: Vascular;  Laterality: Right;  .  INSERTION OF DIALYSIS CATHETER Right 04/04/2017   Procedure: INSERTION OF DIALYSIS CATHETER;  Surgeon: Waynetta Sandy, MD;  Location: [place];  Service: Vascular;  Laterality: Right;  . LIGATION OF ARTERIOVENOUS  FISTULA Left 04/14/2016   Procedure: LIGATION OF ARTERIOVENOUS  FISTULA;  Surgeon: Angelia Mould, MD;  Location: [place];  Service: Vascular;  Laterality: Left;  . PATCH ANGIOPLASTY Left 06/19/2016   Procedure: PATCH ANGIOPLASTY LEFT ARTERIOVENOUS FISTULA;  Surgeon: Angelia Mould, MD;  Location: [place];  Service: Vascular;   Laterality: Left;  . POLYPECTOMY  09/04/2017   Procedure: POLYPECTOMY;  Surgeon: Ronnette Juniper, MD;  Location: [facility] ENDOSCOPY;  Service: Gastroenterology;;  . REVISON OF ARTERIOVENOUS FISTULA Left 06/19/2016   Procedure: REVISION SUPERFICIALIZATION OF BRACHIOCEPHALIC ARTERIOVENOUS FISTULA;  Surgeon: Angelia Mould, MD;  Location[facility] OR;  Service: Vascular;  Laterality: Left;    Social History: Social History   Tobacco Use  . Smoking status: Never Smoker  . Smokeless tobacco: Never Used  Substance Use Topics  . Alcohol use: No  . Drug use: No   Additional social history: Lives at home with 2 daughters and a grandaugher   Family History: Family History  Problem Relation Age of Onset  . Hypertension Mother   . Thyroid disease Mother   . Coronary artery disease Father   . Hypertension Father   . Diabetes Father   . Colon cancer Maternal Aunt   . Esophageal cancer Maternal Uncle   . Inflammatory bowel disease Neg Hx   . Liver disease Neg Hx   . Pancreatic cancer Neg Hx   . Rectal cancer Neg Hx   . Stomach cancer Neg Hx     Allergies and Medications: Allergies  Allergen Reactions  . Nsaids Other (See Comments)    Cannot take due to Kidney disease/Kidney function   . Tolmetin Other (See Comments)    Cannot take due to Kidney Disease   No current facility-administered medications on file prior to encounter.    Current Outpatient Medications on File Prior to Encounter  Medication Sig Dispense Refill  . allopurinol (ZYLOPRIM) 100 MG tablet Take 100 mg by mouth at bedtime.     . ALPRAZolam (XANAX) 1 MG tablet Take 1 mg by mouth 2 (two) times daily as needed for anxiety.     Marland Kitchen atorvastatin (LIPITOR) 40 MG tablet Take 40 mg by mouth at bedtime.   3  . calcitRIOL (ROCALTROL) 0.5 MCG capsule Take 1 capsule (0.5 mcg total) by mouth daily with breakfast. (Patient taking differently: Take 0.5 mcg by mouth at bedtime. ) 30 capsule 0  . cinacalcet (SENSIPAR) 30 MG tablet Take 1  tablet (30 mg total) by mouth daily. (Patient taking differently: Take 30 mg by mouth daily with supper. ) 30 tablet 0  . diphenhydramine-acetaminophen (TYLENOL PM) 25-500 MG TABS tablet Take 2 tablets by mouth at bedtime as needed (sleep).     . hydrOXYzine (ATARAX/VISTARIL) 25 MG tablet Take 25 mg by mouth every 6 (six) hours as needed for itching.     . metoprolol tartrate (LOPRESSOR) 25 MG tablet Take 25 mg by mouth at bedtime.     . multivitamin (RENA-VIT) TABS tablet Take 1 tablet by mouth at bedtime.     Marland Kitchen omeprazole (PRILOSEC) 40 MG capsule Take 40 mg by mouth 2 (two) times daily.    . prochlorperazine (COMPAZINE) 10 MG tablet Take 1 tablet (10 mg total) by mouth every 8 (eight) hours as needed for nausea or vomiting.  50 tablet 2  . promethazine (PHENERGAN) 25 MG tablet Take 25 mg by mouth every 8 (eight) hours as needed for nausea or vomiting.    . senna (SENOKOT) 8.6 MG TABS tablet Take 2 tablets (17.2 mg total) by mouth at bedtime. (Patient taking differently: Take 2 tablets by mouth at bedtime as needed for mild constipation. ) 120 each 0  . sucroferric oxyhydroxide (VELPHORO) 500 MG chewable tablet Chew 1,000 mg by mouth 3 (three) times daily with meals.     . venlafaxine XR (EFFEXOR-XR) 75 MG 24 hr capsule Take 75 mg by mouth at bedtime.       Objective: BP (!) 142/83 (BP Location: Right Arm)   Pulse 84   Temp 98.2 F (36.8 C) (Oral)   Resp 16   Ht $R'5\' 7"'SV$  (1.702 m)   Wt 265 lb 6.4 oz (120.4 kg)   SpO2 94%   BMI 41.57 kg/m   Physical Exam Constitutional:      Appearance: She is well-developed. She is obese.     Comments: Lying in bed in clear discomfort in the course of our interview.  She would intermittently flinch from stomach pain.  Cardiovascular:     Rate and Rhythm: Normal rate and regular rhythm.     Heart sounds: Normal heart sounds.  Pulmonary:     Effort: Pulmonary effort is normal. No respiratory distress.     Breath sounds: Normal breath sounds. No wheezing  or rales.  Abdominal:     General: Abdomen is protuberant. Bowel sounds are normal.     Tenderness: There is generalized abdominal tenderness.     Comments: Peritoneal catheter in the left middle quadrant covered with bandages which were clean, dry and intact.  No evident erythema or drainage of the abdomen.  Skin:    General: Skin is warm and dry.     Findings: No rash.  Neurological:     General: No focal deficit present.     Mental Status: She is alert and oriented to person, place, and time.  Psychiatric:        Mood and Affect: Mood normal.        Behavior: Behavior normal.      Labs and Imaging: CBC BMET  Recent Labs  Lab 04/03/18 2055  WBC 9.3  HGB 8.9*  HCT 27.7*  PLT 227   Recent Labs  Lab 04/03/18 2055  NA 137  K 3.7  CL 99  CO2 18*  BUN 67*  CREATININE 16.88*  GLUCOSE 120*  CALCIUM 7.2*     No results found.   Matilde Haymaker, MD PGY-1, [place] Intern pager: [phone], text pages welcome  FPTS Upper-Level Resident Addendum   I have independently interviewed and examined the patient. I have discussed the above with the original author and agree with their documentation. My edits for correction/addition/clarification are in pink. Please see also any attending notes.    Lucila Maine, DO PGY-3, [place] Service pager: [phone] (text pages welcome through [place])

## 2018-04-03 NOTE — Consult Note (Signed)
Reason for Consult: To manage dialysis and dialysis related needs Referring Physician: Dr. Lucrezia Europe Madewell is an 43 y.o. female.   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 Gustavus Messing for evaluation and recommendations surrounding ESRD, evaluation of peritonitis, and provision of dialysis.    Pt reports 3 days of worsening abdominal pain and intermittent passing of dark stools  Recurrent issue for her.  Has had extensive GI workup in the past (unrevealing) and recently was admitted to [facility] (discharged 2/23) for GI bleeding likely 2/2 AVMs, neg upper endoscopy 2/14.  Had telehealth visit 3/24 with GI physician and it was suggested to repeat capsule endoscopy.  Balloon enteroscopy as also been considered.  Pt reports diffuse abd pain "just like peritonitis" but worse in LUQ.  Fevers up to 100 degrees F.  Had cultures drawn OP dialysis 3/26 with 1 WBC in sample, clear, colorless sample, and neg Gm stain and culture.  Completed 2 weeks vanc/ ceftaz as an outpatient.    In ED, was found to be hemodynamically stable.  Hgb 8.9 (down from 11.6 3/25), BUN 67, Cr 16.88.  Denies cough, congestion, exposure to known persons with COVID-19.  PD orders: CCPD 7 days/ week EDW 108.5 kg  5 cycler exchanges 3L 2 hr dwell time last fill 3L 1 day exchange 3L 4 hr dwell time ( 6 exchanges in total)  Past Medical History:  Diagnosis Date  . Acid reflux   . Anxiety   . Arthritis   . Dyspnea   . Dysrhythmia    tachycardia  . ESRD (end stage renal disease) ([place])    [facility]SAT- [place]  . GI bleeding 12/2017  . Gout   . Headache(784.0)    "related to chemo; sometimes weekly" (09/12/2013)  . High cholesterol   . History of blood transfusion "a couple"   "related to low counts"  . Hypertension   . Iron deficiency anemia    "get epogen shots q month"  (02/20/2014)  . 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 Peritoneal dialysis status [facility])     Past Surgical History:  Procedure Laterality Date  . ABDOMINAL HYSTERECTOMY  2010   "laparoscopic"  . ANKLE FRACTURE SURGERY Right 1994  . AV FISTULA PLACEMENT Left 04/14/2016   Procedure: ARTERIOVENOUS (AV) FISTULA CREATION;  Surgeon: Angelia Mould, MD;  Location: [facility] OR;  Service: Vascular;  Laterality: Left;  . AV FISTULA PLACEMENT Left 08/09/2017   Procedure: INSERTION OF ARTERIOVENOUS (AV) GORE-TEX GRAFT LEFT ARM;  Surgeon: Elam Dutch, MD;  Location: [place];  Service: Vascular;  Laterality: Left;  . [place] REMOVAL Left 08/11/2017   Procedure: REMOVAL OF ARTERIOVENOUS GORETEX GRAFT ([place]);  Surgeon: Serafina Mitchell, MD;  Location: [facility] OR;  Service: Vascular;  Laterality: Left;  . BIOPSY  09/03/2017   Procedure: BIOPSY;  Surgeon: Otis Brace, MD;  Location: [place];  Service: Gastroenterology;;  . BIOPSY  12/24/2017   Procedure: BIOPSY;  Surgeon: Irving Copas., MD;  Location: [place];  Service: Gastroenterology;;  . CARDIAC CATHETERIZATION  2000's  . COLONOSCOPY WITH PROPOFOL N/A 09/04/2017   Procedure: COLONOSCOPY WITH PROPOFOL;  Surgeon: Ronnette Juniper, MD;  Location: [place];  Service: Gastroenterology;  Laterality: N/A;  . ENTEROSCOPY N/A 12/24/2017  Procedure: ENTEROSCOPY;  Surgeon: Rush Landmark Telford Nab., MD;  Location: [place];  Service: Gastroenterology;  Laterality: N/A;  . ESOPHAGOGASTRODUODENOSCOPY    . ESOPHAGOGASTRODUODENOSCOPY (EGD) WITH PROPOFOL Left 06/04/2017   Procedure: ESOPHAGOGASTRODUODENOSCOPY (EGD) WITH PROPOFOL;  Surgeon: Arta Silence, MD;  Location: [place];  Service: Endoscopy;  Laterality: Left;  . ESOPHAGOGASTRODUODENOSCOPY (EGD) WITH PROPOFOL N/A 09/03/2017   Procedure: ESOPHAGOGASTRODUODENOSCOPY (EGD) WITH PROPOFOL;  Surgeon: Otis Brace, MD;  Location:  [place];  Service: Gastroenterology;  Laterality: N/A;  . FISTULA SUPERFICIALIZATION Left 04/02/2017   Procedure: FISTULA SUPERFICIALIZATION LEFT ARM;  Surgeon: Serafina Mitchell, MD;  Location: [place];  Service: Vascular;  Laterality: Left;  . FISTULOGRAM Left 04/07/2016   Procedure: FISTULOGRAM;  Surgeon: Waynetta Sandy, MD;  Location: [place];  Service: Vascular;  Laterality: Left;  . FRACTURE SURGERY     right ankle  . GIVENS CAPSULE STUDY N/A 10/29/2017   Procedure: GIVENS CAPSULE STUDY;  Surgeon: Wilford Corner, MD;  Location: [place];  Service: Endoscopy;  Laterality: N/A;  . GIVENS CAPSULE STUDY N/A 12/24/2017   Procedure: GIVENS CAPSULE STUDY;  Surgeon: Irving Copas., MD;  Location: [place];  Service: Gastroenterology;  Laterality: N/A;  . INSERTION OF DIALYSIS CATHETER Right 04/07/2016   Procedure: INSERTION OF DIALYSIS CATHETER;  Surgeon: Waynetta Sandy, MD;  Location: [place];  Service: Vascular;  Laterality: Right;  . INSERTION OF DIALYSIS CATHETER Right 04/04/2017   Procedure: INSERTION OF DIALYSIS CATHETER;  Surgeon: Waynetta Sandy, MD;  Location: [place];  Service: Vascular;  Laterality: Right;  . LIGATION OF ARTERIOVENOUS  FISTULA Left 04/14/2016   Procedure: LIGATION OF ARTERIOVENOUS  FISTULA;  Surgeon: Angelia Mould, MD;  Location: [place];  Service: Vascular;  Laterality: Left;  . PATCH ANGIOPLASTY Left 06/19/2016   Procedure: PATCH ANGIOPLASTY LEFT ARTERIOVENOUS FISTULA;  Surgeon: Angelia Mould, MD;  Location: [place];  Service: Vascular;  Laterality: Left;  . POLYPECTOMY  09/04/2017   Procedure: POLYPECTOMY;  Surgeon: Ronnette Juniper, MD;  Location: [facility] ENDOSCOPY;  Service: Gastroenterology;;  . REVISON OF ARTERIOVENOUS FISTULA Left 06/19/2016   Procedure: REVISION SUPERFICIALIZATION OF BRACHIOCEPHALIC ARTERIOVENOUS FISTULA;  Surgeon: Angelia Mould, MD;  Location: [facility] OR;  Service: Vascular;  Laterality: Left;     Family History  Problem Relation Age of Onset  . Hypertension Mother   . Thyroid disease Mother   . Coronary artery disease Father   . Hypertension Father   . Diabetes Father   . Colon cancer Maternal Aunt   . Esophageal cancer Maternal Uncle   . Inflammatory bowel disease Neg Hx   . Liver disease Neg Hx   . Pancreatic cancer Neg Hx   . Rectal cancer Neg Hx   . Stomach cancer Neg Hx     Social History:  reports that she has never smoked. She has never used smokeless tobacco. She reports that she does not drink alcohol or use drugs.  Allergies:  Allergies  Allergen Reactions  . Nsaids Other (See Comments)    Cannot take due to Kidney disease/Kidney function   . Tolmetin Other (See Comments)    Cannot take due to Kidney Disease    Medications: Scheduled: reviewed in Epic   Results for orders placed or performed during the hospital encounter of 04/03/18 (from the past 48 hour(s))  CBC with Differential     Status: Abnormal   Collection Time: 04/03/18  8:55 PM  Result Value Ref Range   WBC 9.3 4.0 - 10.5 K/uL  RBC 3.12 (L) 3.87 - 5.11 MIL/uL   Hemoglobin 8.9 (L) 12.0 - 15.0 g/dL   HCT 27.7 (L) 36.0 - 46.0 %   MCV 88.8 80.0 - 100.0 fL   MCH 28.5 26.0 - 34.0 pg   MCHC 32.1 30.0 - 36.0 g/dL   RDW 16.5 (H) 11.5 - 15.5 %   Platelets 227 150 - 400 K/uL   nRBC 0.5 (H) 0.0 - 0.2 %   Neutrophils Relative % 73 %   Neutro Abs 6.9 1.7 - 7.7 K/uL   Lymphocytes Relative 17 %   Lymphs Abs 1.5 0.7 - 4.0 K/uL   Monocytes Relative 7 %   Monocytes Absolute 0.6 0.1 - 1.0 K/uL   Eosinophils Relative 2 %   Eosinophils Absolute 0.2 0.0 - 0.5 K/uL   Basophils Relative 0 %   Basophils Absolute 0.0 0.0 - 0.1 K/uL   Immature Granulocytes 1 %   Abs Immature Granulocytes 0.08 (H) 0.00 - 0.07 K/uL    Comment: Performed at [facility], [address], [place], [place] [address]  Comprehensive metabolic panel     Status: Abnormal   Collection Time: 04/03/18  8:55 PM  Result  Value Ref Range   Sodium 137 135 - 145 mmol/L   Potassium 3.7 3.5 - 5.1 mmol/L   Chloride 99 98 - 111 mmol/L   CO2 18 (L) 22 - 32 mmol/L   Glucose, Bld 120 (H) 70 - 99 mg/dL   BUN 67 (H) 6 - 20 mg/dL   Creatinine, Ser 16.88 (H) 0.44 - 1.00 mg/dL   Calcium 7.2 (L) 8.9 - 10.3 mg/dL   Total Protein 6.7 6.5 - 8.1 g/dL   Albumin 2.9 (L) 3.5 - 5.0 g/dL   AST 17 15 - 41 U/L   ALT 14 0 - 44 U/L   Alkaline Phosphatase 57 38 - 126 U/L   Total Bilirubin 0.8 0.3 - 1.2 mg/dL   GFR calc non Af Amer 2 (L) >60 mL/min   GFR calc Af Amer 3 (L) >60 mL/min   Anion gap 20 (H) 5 - 15    Comment: Performed at [facility], [place] [address] [place], [place] [address]  Lipase, blood     Status: None   Collection Time: 04/03/18  8:55 PM  Result Value Ref Range   Lipase 36 11 - 51 U/L    Comment: Performed at [facility] [address], [place], [place] [address]  Lactic acid, plasma     Status: None   Collection Time: 04/03/18  8:55 PM  Result Value Ref Range   Lactic Acid, Venous 1.5 0.5 - 1.9 mmol/L    Comment: Performed at [facility] [address] [place], [place] [address]    No results found.  ROS: all other systems reviewed and are negative except as per HPI Blood pressure (!) 148/69, pulse 90, temperature 98.8 F (37.1 C), temperature source Oral, resp. rate (!) 27, height $RemoveBe'5\' 7"'MOONKtgfY$  (1.702 m), weight 118.4 kg, SpO2 99 %. .  GEN obese, appears uncomfortable HEENT EOMI PERRL NECK no JVD PULM clear bilaterally CV RRR no m/r/g ABD soft, PD cath in LUQ, c/d/i, diffusely tender esp in LUQ LLQ EXT trace LE edema NEURO AAO x 3 nonfocal SKIN no rashes or lesions MSK no effusions  Assessment/Plan: 1 Abd pain/ diarrhea/ dark stools: seen by GI, extensive GI workup unrevealing.  Hgb 8.9, down form 11.6 3/25.  Repeat capsule endoscopy vs  balloon enteroscopy has been considered.  Per primary and GI. 2 ESRD: CCPD.  Recent PD fluid culture taken as an OP was unrevealing--> we are  going to do another PD gram stain and culture here.  If recurrent peritonitis, may need to evaluate with CT scan for loculated fluid collection/ tunnel infection (vs other intra-abd pathology).  Antibiotics pending gram stain 3 Hypertension: on lisinopril 10 mg daily 4. 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  Will get records for when ESA last given and give as appropriate 5. Metabolic Bone Disease: Velphoro 2 tablets TID AC, calcitriol 1 mcg daily  6.  Nutrition: renal vitamin and supps 7.  Dispo: being admitted  Madelon Lips 04/03/2018, 9:58 PM

## 2018-04-03 NOTE — ED Provider Notes (Signed)
[facility] EMERGENCY DEPARTMENT Provider Note   CSN: [id number] Arrival date & time: 04/03/18  2010    History   Chief Complaint Chief Complaint  Patient presents with  . Abdominal Pain  . Fever  . Diarrhea    HPI Patricia [name] is a 43 y.o. female.     The history is provided by the patient and medical records. No language interpreter was used.  Abdominal Pain  Pain location:  Generalized Pain quality: aching and sharp   Pain radiates to:  Does not radiate Pain severity:  Severe Onset quality:  Gradual Duration:  3 days Timing:  Constant Progression:  Waxing and waning Chronicity:  Recurrent Relieved by:  Nothing Worsened by:  Nothing Associated symptoms: chills, diarrhea, fatigue, fever and nausea   Associated symptoms: no chest pain, no constipation, no cough, no shortness of breath and no vomiting   Risk factors: obesity and recent hospitalization     Past Medical History:  Diagnosis Date  . Acid reflux   . Anxiety   . Arthritis   . Dyspnea   . Dysrhythmia    tachycardia  . ESRD (end stage renal disease) ([place])    TTHSAT- [place]  . GI bleeding 12/2017  . Gout   . Headache(784.0)    "related to chemo; sometimes weekly" (09/12/2013)  . High cholesterol   . History of blood transfusion "a couple"   "related to low counts"  . Hypertension   . Iron deficiency anemia    "get epogen shots q month" (02/20/2014)  . 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 Peritoneal dialysis status [facility])     Patient Active Problem List   Diagnosis Date Noted  . History of peritonitis 03/29/2018  . Peritonitis associated with peritoneal dialysis ([place]) 02/22/2018  . Early satiety 02/18/2018  . AVM (arteriovenous malformation) of small bowel, acquired 02/18/2018  . Acute gastric ulcer without hemorrhage or perforation 02/18/2018  . Dark stools 02/18/2018  . Acute GI bleeding 12/23/2017  .  Symptomatic anemia 10/28/2017  . Esophageal candidiasis ([place]) 09/01/2017  . Anxiety 09/01/2017  . Stroke ([place]) 08/15/2017  . Dysphagia 08/15/2017  . Melena 06/03/2017  . Abdominal pain 06/03/2017  . Dialysis AV fistula malfunction ([place]) 04/04/2017  . ESRD on peritoneal dialysis ([place]) 04/01/2016  . Anemia due to chronic kidney disease 04/01/2016  . GERD (gastroesophageal reflux disease) 04/01/2016  . Hypomagnesemia 04/28/2015  . Acute renal failure superimposed on stage 3 chronic kidney disease ([place]) 04/12/2014  . Atypical chest pain   . Chest pain 02/19/2014  . Hypokalemia 02/19/2014  . Nephrotic syndrome 10/02/2013  . Dyslipidemia 10/02/2013  . PAT (paroxysmal atrial tachycardia) ([place]) 09/29/2013  . Normocytic anemia 06/29/2013  . Edema 06/29/2013  . Nausea without vomiting 06/29/2013  . Fever, unspecified 01/31/2013  . Glomerulonephritis 01/28/2013  . Syncope 01/28/2013  . Shortness of breath 07/04/2011  . Morbid obesity ([place]) 07/04/2011  . HTN (hypertension) 07/04/2011  . Hypercholesterolemia 07/04/2011    Past Surgical History:  Procedure Laterality Date  . ABDOMINAL HYSTERECTOMY  2010   "laparoscopic"  . ANKLE FRACTURE SURGERY Right 1994  . AV FISTULA PLACEMENT Left 04/14/2016   Procedure: ARTERIOVENOUS (AV) FISTULA CREATION;  Surgeon: Angelia Mould, MD;  Location: [facility] OR;  Service: Vascular;  Laterality: Left;  . AV FISTULA PLACEMENT Left 08/09/2017   Procedure: INSERTION OF ARTERIOVENOUS (AV) GORE-TEX GRAFT LEFT ARM;  Surgeon: Elam Dutch, MD;  Location: [facility] OR;  Service: Vascular;  Laterality: Left;  . AVGG REMOVAL Left 08/11/2017   Procedure: REMOVAL OF ARTERIOVENOUS GORETEX GRAFT (AVGG);  Surgeon: Nada Libman, MD;  Location: [facility] OR;  Service: Vascular;  Laterality: Left;  . BIOPSY  09/03/2017   Procedure: BIOPSY;  Surgeon: Kathi Der, MD;  Location: [facility] ENDOSCOPY;  Service: Gastroenterology;;  . BIOPSY  12/24/2017   Procedure: BIOPSY;  Surgeon:  Lemar Lofty., MD;  Location: [facility] ENDOSCOPY;  Service: Gastroenterology;;  . CARDIAC CATHETERIZATION  2000's  . COLONOSCOPY WITH PROPOFOL N/A 09/04/2017   Procedure: COLONOSCOPY WITH PROPOFOL;  Surgeon: Kerin Salen, MD;  Location: [facility] ENDOSCOPY;  Service: Gastroenterology;  Laterality: N/A;  . ENTEROSCOPY N/A 12/24/2017   Procedure: ENTEROSCOPY;  Surgeon: Meridee Score Netty Starring., MD;  Location: [facility] ENDOSCOPY;  Service: Gastroenterology;  Laterality: N/A;  . ESOPHAGOGASTRODUODENOSCOPY    . ESOPHAGOGASTRODUODENOSCOPY (EGD) WITH PROPOFOL Left 06/04/2017   Procedure: ESOPHAGOGASTRODUODENOSCOPY (EGD) WITH PROPOFOL;  Surgeon: Willis Modena, MD;  Location: [facility] ENDOSCOPY;  Service: Endoscopy;  Laterality: Left;  . ESOPHAGOGASTRODUODENOSCOPY (EGD) WITH PROPOFOL N/A 09/03/2017   Procedure: ESOPHAGOGASTRODUODENOSCOPY (EGD) WITH PROPOFOL;  Surgeon: Kathi Der, MD;  Location: [facility] ENDOSCOPY;  Service: Gastroenterology;  Laterality: N/A;  . FISTULA SUPERFICIALIZATION Left 04/02/2017   Procedure: FISTULA SUPERFICIALIZATION LEFT ARM;  Surgeon: Nada Libman, MD;  Location: [facility] OR;  Service: Vascular;  Laterality: Left;  . FISTULOGRAM Left 04/07/2016   Procedure: FISTULOGRAM;  Surgeon: Maeola Harman, MD;  Location: [facility] OR;  Service: Vascular;  Laterality: Left;  . FRACTURE SURGERY     right ankle  . GIVENS CAPSULE STUDY N/A 10/29/2017   Procedure: GIVENS CAPSULE STUDY;  Surgeon: Charlott Rakes, MD;  Location: [facility] ENDOSCOPY;  Service: Endoscopy;  Laterality: N/A;  . GIVENS CAPSULE STUDY N/A 12/24/2017   Procedure: GIVENS CAPSULE STUDY;  Surgeon: Lemar Lofty., MD;  Location: [facility] ENDOSCOPY;  Service: Gastroenterology;  Laterality: N/A;  . INSERTION OF DIALYSIS CATHETER Right 04/07/2016   Procedure: INSERTION OF DIALYSIS CATHETER;  Surgeon: Maeola Harman, MD;  Location: [facility] OR;  Service: Vascular;  Laterality: Right;  . INSERTION OF DIALYSIS CATHETER Right 04/04/2017    Procedure: INSERTION OF DIALYSIS CATHETER;  Surgeon: Maeola Harman, MD;  Location: [facility] OR;  Service: Vascular;  Laterality: Right;  . LIGATION OF ARTERIOVENOUS  FISTULA Left 04/14/2016   Procedure: LIGATION OF ARTERIOVENOUS  FISTULA;  Surgeon: Chuck Hint, MD;  Location: [facility] OR;  Service: Vascular;  Laterality: Left;  . PATCH ANGIOPLASTY Left 06/19/2016   Procedure: PATCH ANGIOPLASTY LEFT ARTERIOVENOUS FISTULA;  Surgeon: Chuck Hint, MD;  Location: [facility] OR;  Service: Vascular;  Laterality: Left;  . POLYPECTOMY  09/04/2017   Procedure: POLYPECTOMY;  Surgeon: Kerin Salen, MD;  Location: [facility] ENDOSCOPY;  Service: Gastroenterology;;  . REVISON OF ARTERIOVENOUS FISTULA Left 06/19/2016   Procedure: REVISION SUPERFICIALIZATION OF BRACHIOCEPHALIC ARTERIOVENOUS FISTULA;  Surgeon: Chuck Hint, MD;  Location: [facility] OR;  Service: Vascular;  Laterality: Left;     OB History    Gravida  3   Para      Term      Preterm      AB      Living        SAB      TAB      Ectopic      Multiple  3   Live Births               Home Medications    Prior  to Admission medications   Medication Sig Start Date End Date Taking? Authorizing Provider  allopurinol (ZYLOPRIM) 100 MG tablet Take 100 mg by mouth at bedtime.     [provider]  ALPRAZolam Duanne Moron) 1 MG tablet Take 1 mg by mouth 2 (two) times daily as needed for anxiety.  03/19/17   [provider]  atorvastatin (LIPITOR) 40 MG tablet Take 40 mg by mouth at bedtime.  03/14/14   [provider]  calcitRIOL (ROCALTROL) 0.5 MCG capsule Take 1 capsule (0.5 mcg total) by mouth daily with breakfast. Patient taking differently: Take 0.5 mcg by mouth at bedtime.  06/04/17   Bonnielee Haff, MD  cinacalcet (SENSIPAR) 30 MG tablet Take 1 tablet (30 mg total) by mouth daily. Patient taking differently: Take 30 mg by mouth daily with supper.  08/19/17   Shelly Coss, MD   diphenhydramine-acetaminophen (TYLENOL PM) 25-500 MG TABS tablet Take 2 tablets by mouth at bedtime as needed (sleep).     [provider]  hydrOXYzine (ATARAX/VISTARIL) 25 MG tablet Take 25 mg by mouth every 6 (six) hours as needed for itching.  01/25/18   [provider]  metoprolol tartrate (LOPRESSOR) 25 MG tablet Take 25 mg by mouth at bedtime.  04/17/16   [provider]  multivitamin (RENA-VIT) TABS tablet Take 1 tablet by mouth at bedtime.     [provider]  omeprazole (PRILOSEC) 40 MG capsule Take 40 mg by mouth 2 (two) times daily.    [provider]  prochlorperazine (COMPAZINE) 10 MG tablet Take 1 tablet (10 mg total) by mouth every 8 (eight) hours as needed for nausea or vomiting. 02/17/18   Mansouraty, Telford Nab., MD  promethazine (PHENERGAN) 25 MG tablet Take 25 mg by mouth every 8 (eight) hours as needed for nausea or vomiting.    [provider]  senna (SENOKOT) 8.6 MG TABS tablet Take 2 tablets (17.2 mg total) by mouth at bedtime. Patient taking differently: Take 2 tablets by mouth at bedtime as needed for mild constipation.  04/15/16   Nita Sells, MD  sucroferric oxyhydroxide (VELPHORO) 500 MG chewable tablet Chew 1,000 mg by mouth 3 (three) times daily with meals.     [provider]  venlafaxine XR (EFFEXOR-XR) 75 MG 24 hr capsule Take 75 mg by mouth at bedtime.     [provider]    Family History Family History  Problem Relation Age of Onset  . Hypertension Mother   . Thyroid disease Mother   . Coronary artery disease Father   . Hypertension Father   . Diabetes Father   . Colon cancer Maternal Aunt   . Esophageal cancer Maternal Uncle   . Inflammatory bowel disease Neg Hx   . Liver disease Neg Hx   . Pancreatic cancer Neg Hx   . Rectal cancer Neg Hx   . Stomach cancer Neg Hx     Social History Social History   Tobacco Use  . Smoking status: Never Smoker  . Smokeless tobacco:  Never Used  Substance Use Topics  . Alcohol use: No  . Drug use: No     Allergies   Nsaids and Tolmetin   Review of Systems Review of Systems  Constitutional: Positive for chills, fatigue and fever. Negative for diaphoresis.  HENT: Negative for congestion.   Eyes: Negative for visual disturbance.  Respiratory: Negative for cough, chest tightness, shortness of breath, wheezing and stridor.   Cardiovascular: Negative for chest pain, palpitations and leg swelling.  Gastrointestinal: Positive for abdominal pain, diarrhea and nausea. Negative for constipation and vomiting.  Genitourinary: Negative for flank pain and frequency.  Musculoskeletal: Negative for back pain, neck pain and neck stiffness.  Skin: Negative for rash and wound.  Neurological: Positive for light-headedness. Negative for dizziness, weakness and numbness.  Psychiatric/Behavioral: Negative for agitation.  All other systems reviewed and are negative.    Physical Exam Updated Vital Signs BP (!) 142/83 (BP Location: Right Arm)   Pulse 79   Temp 98.8 F (37.1 C) (Oral)   Resp 15   Ht $R'5\' 7"'XY$  (1.702 m)   Wt 118.4 kg   SpO2 100%   BMI 40.88 kg/m   Physical Exam Vitals signs and nursing note reviewed.  Constitutional:      General: She is not in acute distress.    Appearance: She is well-developed. She is obese. She is not ill-appearing, toxic-appearing or diaphoretic.  HENT:     Head: Normocephalic and atraumatic.     Nose: No congestion or rhinorrhea.  Eyes:     General: No scleral icterus.    Extraocular Movements: Extraocular movements intact.     Conjunctiva/sclera: Conjunctivae normal.     Pupils: Pupils are equal, round, and reactive to light.  Neck:     Musculoskeletal: Neck supple.  Cardiovascular:     Rate and Rhythm: Normal rate and regular rhythm.     Pulses: Normal pulses.     Heart sounds: No murmur.  Pulmonary:     Effort: Pulmonary effort is normal. No respiratory distress.     Breath  sounds: Normal breath sounds. No wheezing, rhonchi or rales.  Chest:     Chest wall: No tenderness.  Abdominal:     General: Abdomen is flat. Bowel sounds are normal. There is no distension.     Palpations: Abdomen is soft.     Tenderness: There is generalized abdominal tenderness. There is no right CVA tenderness, left CVA tenderness, guarding or rebound.  Musculoskeletal:        General: No tenderness.  Skin:    General: Skin is warm and dry.     Findings: No erythema.  Neurological:     Mental Status: She is alert.  Psychiatric:        Mood and Affect: Mood normal.      ED Treatments / Results  Labs (all labs ordered are listed, but only abnormal results are displayed) Labs Reviewed  CBC WITH DIFFERENTIAL/PLATELET - Abnormal; Notable for the following components:      Result Value   RBC 3.12 (*)    Hemoglobin 8.9 (*)    HCT 27.7 (*)    RDW 16.5 (*)    nRBC 0.5 (*)    Abs Immature Granulocytes 0.08 (*)    All other components within normal limits  COMPREHENSIVE METABOLIC PANEL - Abnormal; Notable for the following components:   CO2 18 (*)    Glucose, Bld 120 (*)    BUN 67 (*)    Creatinine, Ser 16.88 (*)    Calcium 7.2 (*)    Albumin 2.9 (*)    GFR calc non Af Amer 2 (*)    GFR calc Af Amer 3 (*)    Anion gap 20 (*)    All other components within normal limits  URINE CULTURE  CULTURE, BLOOD (ROUTINE X 2)  CULTURE, BLOOD (ROUTINE X 2)  BODY FLUID CULTURE  LIPASE, BLOOD  LACTIC ACID, PLASMA  LACTIC ACID, PLASMA  URINALYSIS, ROUTINE W REFLEX MICROSCOPIC  BODY FLUID CELL COUNT WITH DIFFERENTIAL    EKG None  Radiology No results found.  Procedures Procedures (including critical care time)  Medications Ordered in ED Medications  ondansetron (ZOFRAN-ODT) disintegrating tablet 4 mg (4 mg Oral Refused 04/03/18 2235)  heparin 1000 unit/ml injection 500 Units (has no administration in time range)  gentamicin cream (GARAMYCIN) 0.1 % 1 application (has no  administration in time range)  dialysis solution 1.5% low-MG/low-CA dianeal solution (has no administration in time range)  dialysis solution 2.5% low-MG/low-CA dianeal solution (has no administration in time range)  dialysis solution 1.5% low-MG/low-CA dianeal solution (has no administration in time range)  dialysis solution 2.5% low-MG/low-CA dianeal solution (has no administration in time range)  HYDROmorphone (DILAUDID) injection 1 mg (1 mg Intravenous Given 04/03/18 2159)  ondansetron (ZOFRAN) injection 4 mg (4 mg Intravenous Given 04/03/18 2159)  sodium chloride 0.9 % bolus 500 mL (0 mLs Intravenous Stopped 04/03/18 2224)     Initial Impression / Assessment and Plan / ED Course  I have reviewed the triage vital signs and the nursing notes.  Pertinent labs & imaging results that were available during my care of the patient were reviewed by me and considered in my medical decision making (see chart for details).        Annie Agent is a 43 y.o. female with a past medical history significant for hypertension, hypercholesterolemia, ESRD on peritoneal dialysis, GERD, prior peritonitis last month, recent GI bleed from small bowel AVM, and prior stroke who presents with fevers, chills, severe abdominal pain, nausea, diarrhea, and dark stools.  Patient reports that she was admitted last month for GI bleed and was started on peritoneal antibiotics for 14 days with concern for peritonitis.  She reports she completed antibiotics and has been doing well until the last 3 days.  She reports has had return of the severe abdominal pain that is now an 8 out of 10 in severity across her abdomen.  She reports nausea but no vomiting.  She reports loose stools with diarrhea.  She denies any chest pain shortness of breath or cough.  She denies any recent trauma.  She reports that she had a fever of 100 yesterday and today and then took a hydrocodone with Tylenol before coming to the emergency department.   Patient is afebrile on arrival however this may be due to the Tylenol use.  Patient reports he does still make some urine and has had no urinary symptoms.  She reports has had dark stools for the last few days as well.  Patient was told by her GI doctor and PCP to come to the emergency department for evaluation.  On exam, abdomen is diffusely tender.  Peritoneal dialysis catheter appears to be in place with no significant drainage around it.  Bowel sounds were appreciated.  Lungs were clear and chest was nontender.  Legs were nonedematous and nontender.  Patient is afebrile on arrival and was not tachycardic or tachypneic.  She reports her abdomen is not more distended than baseline.  Clinically I am concerned about recurrent peritonitis given the fever abdominal pain nausea and her symptoms being similar to prior.  With her dark stools and history of GI bleed this is also considered with her reported fatigue and lightheadedness in the setting of dark stools.  Will get labs and will touch base with nephrology for guidance as to obtaining peritoneal sample with the PD catheter.  Anticipate admission for further management.  She will be given pain medicine  nausea medicine and a small amount of fluids initially.  Nephrology came and feels the patient will need peritoneal fluid evaluation.  They will place the orders for peritoneal dialysis nurses to come get this fluid sample.  They request to be admitted for further monitoring and management.  Patient's hemoglobin was slightly downtrending from prior, this will need to be trended.  Patient had no lactic acid and elevated white count.  At this time, will hold on antibiotics.  Also discussed with nephrology CT scan due to the abdominal pain however as patient still makes some urine, they agree to wait on CT until after peritoneal fluid analysis.  Patient will be admitted for further management and will hold on antibiotics at this time.  Patient given pain  medicine to help with her symptoms.   Final Clinical Impressions(s) / ED Diagnoses   Final diagnoses:  Generalized abdominal pain  Diarrhea, unspecified type  Nausea  Anemia, unspecified type    ED Discharge Orders    None      Clinical Impression: 1. Generalized abdominal pain   2. Diarrhea, unspecified type   3. Nausea   4. Anemia, unspecified type     Disposition: Admit  This note was prepared with assistance of Dragon voice recognition software. Occasional wrong-word or sound-a-like substitutions may have occurred due to the inherent limitations of voice recognition software.     Januel Doolan, Gwenyth Allegra, MD 04/03/18 2253

## 2018-04-04 ENCOUNTER — Inpatient Hospital Stay (HOSPITAL_COMMUNITY): Payer: Medicare Other

## 2018-04-04 ENCOUNTER — Encounter (HOSPITAL_COMMUNITY): Payer: Self-pay | Admitting: *Deleted

## 2018-04-04 ENCOUNTER — Other Ambulatory Visit: Payer: Self-pay

## 2018-04-04 DIAGNOSIS — R0602 Shortness of breath: Secondary | ICD-10-CM | POA: Diagnosis not present

## 2018-04-04 DIAGNOSIS — D631 Anemia in chronic kidney disease: Secondary | ICD-10-CM | POA: Diagnosis present

## 2018-04-04 DIAGNOSIS — E876 Hypokalemia: Secondary | ICD-10-CM | POA: Diagnosis not present

## 2018-04-04 DIAGNOSIS — R110 Nausea: Secondary | ICD-10-CM | POA: Diagnosis present

## 2018-04-04 DIAGNOSIS — R1084 Generalized abdominal pain: Secondary | ICD-10-CM | POA: Diagnosis not present

## 2018-04-04 DIAGNOSIS — Z8249 Family history of ischemic heart disease and other diseases of the circulatory system: Secondary | ICD-10-CM | POA: Diagnosis not present

## 2018-04-04 DIAGNOSIS — E8889 Other specified metabolic disorders: Secondary | ICD-10-CM | POA: Diagnosis present

## 2018-04-04 DIAGNOSIS — I120 Hypertensive chronic kidney disease with stage 5 chronic kidney disease or end stage renal disease: Secondary | ICD-10-CM | POA: Diagnosis present

## 2018-04-04 DIAGNOSIS — D649 Anemia, unspecified: Secondary | ICD-10-CM | POA: Diagnosis not present

## 2018-04-04 DIAGNOSIS — N186 End stage renal disease: Secondary | ICD-10-CM

## 2018-04-04 DIAGNOSIS — K5520 Angiodysplasia of colon without hemorrhage: Secondary | ICD-10-CM

## 2018-04-04 DIAGNOSIS — K5521 Angiodysplasia of colon with hemorrhage: Secondary | ICD-10-CM | POA: Diagnosis present

## 2018-04-04 DIAGNOSIS — K219 Gastro-esophageal reflux disease without esophagitis: Secondary | ICD-10-CM | POA: Diagnosis present

## 2018-04-04 DIAGNOSIS — E785 Hyperlipidemia, unspecified: Secondary | ICD-10-CM | POA: Diagnosis present

## 2018-04-04 DIAGNOSIS — M1990 Unspecified osteoarthritis, unspecified site: Secondary | ICD-10-CM | POA: Diagnosis present

## 2018-04-04 DIAGNOSIS — R195 Other fecal abnormalities: Secondary | ICD-10-CM | POA: Diagnosis not present

## 2018-04-04 DIAGNOSIS — Z79899 Other long term (current) drug therapy: Secondary | ICD-10-CM | POA: Diagnosis not present

## 2018-04-04 DIAGNOSIS — D509 Iron deficiency anemia, unspecified: Secondary | ICD-10-CM | POA: Diagnosis not present

## 2018-04-04 DIAGNOSIS — Z992 Dependence on renal dialysis: Secondary | ICD-10-CM | POA: Diagnosis not present

## 2018-04-04 DIAGNOSIS — Z6841 Body Mass Index (BMI) 40.0 and over, adult: Secondary | ICD-10-CM | POA: Diagnosis not present

## 2018-04-04 DIAGNOSIS — D62 Acute posthemorrhagic anemia: Secondary | ICD-10-CM | POA: Diagnosis present

## 2018-04-04 DIAGNOSIS — I330 Acute and subacute infective endocarditis: Secondary | ICD-10-CM | POA: Diagnosis present

## 2018-04-04 DIAGNOSIS — K659 Peritonitis, unspecified: Secondary | ICD-10-CM | POA: Diagnosis not present

## 2018-04-04 DIAGNOSIS — R197 Diarrhea, unspecified: Secondary | ICD-10-CM | POA: Diagnosis not present

## 2018-04-04 DIAGNOSIS — E7800 Pure hypercholesterolemia, unspecified: Secondary | ICD-10-CM | POA: Diagnosis present

## 2018-04-04 DIAGNOSIS — F419 Anxiety disorder, unspecified: Secondary | ICD-10-CM | POA: Diagnosis present

## 2018-04-04 DIAGNOSIS — Z9221 Personal history of antineoplastic chemotherapy: Secondary | ICD-10-CM | POA: Diagnosis not present

## 2018-04-04 DIAGNOSIS — M1A9XX0 Chronic gout, unspecified, without tophus (tophi): Secondary | ICD-10-CM | POA: Diagnosis present

## 2018-04-04 DIAGNOSIS — Z888 Allergy status to other drugs, medicaments and biological substances status: Secondary | ICD-10-CM | POA: Diagnosis not present

## 2018-04-04 LAB — BASIC METABOLIC PANEL
Anion gap: 21 — ABNORMAL HIGH (ref 5–15)
BUN: 68 mg/dL — ABNORMAL HIGH (ref 6–20)
CO2: 20 mmol/L — ABNORMAL LOW (ref 22–32)
Calcium: 6.8 mg/dL — ABNORMAL LOW (ref 8.9–10.3)
Chloride: 96 mmol/L — ABNORMAL LOW (ref 98–111)
Creatinine, Ser: 16.14 mg/dL — ABNORMAL HIGH (ref 0.44–1.00)
GFR calc Af Amer: 3 mL/min — ABNORMAL LOW (ref >60)
GFR, EST NON AFRICAN AMERICAN: 2 mL/min — AB (ref >60)
Glucose, Bld: 121 mg/dL — ABNORMAL HIGH (ref 70–99)
POTASSIUM: 4.1 mmol/L (ref 3.5–5.1)
Sodium: 137 mmol/L (ref 135–145)

## 2018-04-04 LAB — CBC
HCT: 27.5 % — ABNORMAL LOW (ref 36.0–46.0)
Hemoglobin: 8.8 g/dL — ABNORMAL LOW (ref 12.0–15.0)
MCH: 28.2 pg (ref 26.0–34.0)
MCHC: 32 g/dL (ref 30.0–36.0)
MCV: 88.1 fL (ref 80.0–100.0)
NRBC: 0.5 % — AB (ref 0.0–0.2)
Platelets: 240 10*3/uL (ref 150–400)
RBC: 3.12 MIL/uL — ABNORMAL LOW (ref 3.87–5.11)
RDW: 16.1 % — ABNORMAL HIGH (ref 11.5–15.5)
WBC: 8.3 10*3/uL (ref 4.0–10.5)

## 2018-04-04 LAB — PHOSPHORUS: PHOSPHORUS: 8.7 mg/dL — AB (ref 2.5–4.6)

## 2018-04-04 LAB — HEMOGLOBIN AND HEMATOCRIT, BLOOD
HEMATOCRIT: 27.5 % — AB (ref 36.0–46.0)
Hemoglobin: 8.7 g/dL — ABNORMAL LOW (ref 12.0–15.0)

## 2018-04-04 LAB — MAGNESIUM: Magnesium: 1.5 mg/dL — ABNORMAL LOW (ref 1.7–2.4)

## 2018-04-04 MED ORDER — SENNOSIDES-DOCUSATE SODIUM 8.6-50 MG PO TABS
1.0000 | ORAL_TABLET | Freq: Every day | ORAL | Status: DC
  Administered 2018-04-04 – 2018-04-06 (×2): 1 via ORAL
  Filled 2018-04-04 (×3): qty 1

## 2018-04-04 MED ORDER — CALCITRIOL 0.5 MCG PO CAPS
1.0000 ug | ORAL_CAPSULE | Freq: Every day | ORAL | Status: DC
  Administered 2018-04-04 – 2018-04-06 (×3): 1 ug via ORAL
  Filled 2018-04-04 (×3): qty 2

## 2018-04-04 MED ORDER — DELFLEX-LC/1.5% DEXTROSE 344 MOSM/L IP SOLN: INTRAPERITONEAL | Status: DC

## 2018-04-04 MED ORDER — VANCOMYCIN HCL 10 G IV SOLR
1500.0000 mg | Freq: Once | INTRAVENOUS | Status: AC
  Administered 2018-04-04: 1500 mg via INTRAVENOUS
  Filled 2018-04-04: qty 1500

## 2018-04-04 MED ORDER — POLYETHYLENE GLYCOL 3350 17 G PO PACK
17.0000 g | PACK | Freq: Every day | ORAL | Status: DC
  Filled 2018-04-04 (×3): qty 1

## 2018-04-04 MED ORDER — SODIUM CHLORIDE 0.9 % IV SOLN
1.0000 g | Freq: Once | INTRAVENOUS | Status: AC
  Administered 2018-04-04: 1 g via INTRAVENOUS
  Filled 2018-04-04: qty 1

## 2018-04-04 MED ORDER — DELFLEX-LC/2.5% DEXTROSE 394 MOSM/L IP SOLN: INTRAPERITONEAL | Status: DC

## 2018-04-04 MED ORDER — HEPARIN 1000 UNIT/ML FOR PERITONEAL DIALYSIS
INTRAPERITONEAL | Status: DC | PRN
  Filled 2018-04-04: qty 5000

## 2018-04-04 MED ORDER — ACETAMINOPHEN 325 MG PO TABS
650.0000 mg | ORAL_TABLET | Freq: Four times a day (QID) | ORAL | Status: DC
  Administered 2018-04-04 (×2): 650 mg via ORAL
  Filled 2018-04-04 (×3): qty 2

## 2018-04-04 NOTE — Progress Notes (Addendum)
[facility] Progress Note   Dialysis Orders: PD orders: CCPD 7 days/ week EDW 108.5 kg  5 cycler exchanges 3L 2 hr dwell time last fill 3L 1 day exchange 3L 4 hr dwell time ( 6 exchanges in total)  Assessment/Plan: 1 Abd pain/ diarrhea/ dark stools: Had 6 watery stools prior to admisison.  Hgb 8.8, down form 11.6 3/25.  Repeat capsule endoscopy vs balloon enteroscopy has been considered.  Per primary and GI. Velphoro also contributes to dark stools - check FOBT again 2 ESRD: CCPD.  Recent PD fluid culture taken as an OP 3/26 was unrevealing--> we are going to do another PD gram stain and culture here (being obtained 3/30 am)- she said it was collected yesterday but not in computer.  If recurrent peritonitis, may need to evaluate with CT scan for loculated fluid collection/ tunnel infection (vs other intra-abd pathology).  Vanc/Fortaz given 3 Hypertension: on lisinopril 10 mg daily 4. Anemia of ESRD/ acute blood loss anemia: recurrent dark stools and worsening anemia.hgg 11.2 3/25.  Prior tsat 3/3 was 76% with ferritin 1639.   Will get records for when ESA last given and give as appropriate - repeat Fe studies with am labs. 5. Metabolic Bone Disease: Velphoro 2 tablets TID AC, calcitriol 1 mcg daily Corrected Ca low 7.8 iPTH 98 6.  Nutrition: renal vitamin and supps   Myriam Jacobson, PA-C [facility] Beeper [phone] 04/04/2018,9:59 AM  LOS: 0 days   Pt seen, examined and agree w A/P as above.  [facility] 04/04/2018, 5:09 PM    Subjective:   Hit pause last night and forgot to resume.  Finishing up PD now - < 2 hr to go. Nauseated. Only wants ice or cold food. LLQ pain feels like when she had perotinitis.  Objective Vitals:   04/03/18 2315 04/04/18 0012 04/04/18 0622 04/04/18 0748  BP: (!) 145/85 (!) 142/83 (!) 141/78 (!) 131/100  Pulse: 86 84 81 70  Resp: 16 16 (!) 7 18  Temp:  98.2 F (36.8 C) 98.4 F (36.9 C) 97.7 F (36.5  C)  TempSrc:  Oral Oral Oral  SpO2: 100% 94% 97% 97%  Weight:  120.4 kg    Height:  $Remove'5\' 7"'guVbuEe$  (1.702 m)     Physical Exam General: ill appearing Heart: RRR Lungs: no rales Abdomen: PD fluid in belly L sided and LLQ pain Extremities: no LE edema Dialysis Access:  PD cath   Additional Objective Labs: Basic Metabolic Panel: Recent Labs  Lab 04/03/18 2055 04/04/18 0524  NA 137 137  K 3.7 4.1  CL 99 96*  CO2 18* 20*  GLUCOSE 120* 121*  BUN 67* 68*  CREATININE 16.88* 16.14*  CALCIUM 7.2* 6.8*  PHOS  --  8.7*   Liver Function Tests: Recent Labs  Lab 04/03/18 2055  AST 17  ALT 14  ALKPHOS 57  BILITOT 0.8  PROT 6.7  ALBUMIN 2.9*   Recent Labs  Lab 04/03/18 2055  LIPASE 36   CBC: Recent Labs  Lab 03/30/18 0816 04/03/18 2055 04/04/18 0524  WBC 12.2* 9.3 8.3  NEUTROABS  --  6.9  --   HGB 11.6* 8.9* 8.8*  HCT 35.6* 27.7* 27.5*  MCV 89.0 88.8 88.1  PLT 416.0* 227 240   Blood Culture    Component Value Date/Time   SDES PERITONEAL DIALYSATE 02/21/2018 2230   SPECREQUEST NONE 02/21/2018 2230   CULT NO GROWTH 02/21/2018 2230   REPTSTATUS 02/25/2018 FINAL 02/21/2018 2230  Cardiac Enzymes: No results for input(s): CKTOTAL, CKMB, CKMBINDEX, TROPONINI in the last 168 hours. CBG: No results for input(s): GLUCAP in the last 168 hours. Iron Studies: No results for input(s): IRON, TIBC, TRANSFERRIN, FERRITIN in the last 72 hours. Lab Results  Component Value Date   INR 1.07 02/26/2018   INR 0.98 12/22/2017   INR 1.04 04/14/2016   Studies/Results: No results found. Medications: . cefTAZidime (FORTAZ)  IV 1 g (04/04/18 0946)  . dialysis solution 1.5% low-MG/low-CA    . dialysis solution 2.5% low-MG/low-CA    . vancomycin     . allopurinol  100 mg Oral QHS  . atorvastatin  40 mg Oral QHS  . calcitRIOL  1 mcg Oral QHS  . gentamicin cream  1 application Topical Daily  . heparin  5,000 Units Subcutaneous Q8H  . metoprolol tartrate  25 mg Oral QHS  .  multivitamin  1 tablet Oral QHS  . ondansetron  4 mg Oral Once  . pantoprazole  80 mg Oral Daily  . sucroferric oxyhydroxide  1,000 mg Oral TID WC  . venlafaxine XR  75 mg Oral QHS

## 2018-04-04 NOTE — Progress Notes (Signed)
Family Medicine Teaching Service Daily Progress Note Intern Pager: [phone]  [name] Medical record number: [id number] Date of birth: 1975-06-05 Age: 43 y.o. Gender: female  Primary Care Provider: Blair Heys, PA-C Consultants: nephrology Code Status: Full code  Pt Overview and Major Events to Date:  Hospital Day 0 Admitted: 04/03/2018  Assessment and Plan: Patricia [name] is a 43 y.o. female presenting with 3 days of abdominal pain.  Past medical history is significant for ESRD on peritoneal dialysis, recent peritonitis, hypertension, paroxysmal atrial tachycardia, arteriovenous malformation of the small bowel.  Abdominal pain concerning for recurrent peritonitis -  3 day hx of abdmonial pain.  VSS, hypertensive, afebrile, no leukocytosis, LA normal.  Electrolytes very abnormal, likely from inadequate dialysis:  Cr 16, BUN 68, Ca 6.8, bicarb 20, AG 21. Recent d/c on 2/21 for peritonitis. Possible abscess?  - consult nephro, appreciate recs - cell count/abd fluid cx pending, consider abdominal CT if nml or if pt worsens - starting vanc and ceftaz (3/30). nephro to dose with PD - phenergan for nausea - dilaudid PO $RemoveBef'1mg'KbPymMSFtJ$  q4h PRN  Anemia 2/2 acute GI bleed possibly - hx of AVMs. 11.6hgb  5 days ago 8.9 today.  Frequent loose, black stools.   - transfuse if under 7.    - CBC daily.    ESRD on PD - nephro consulted.   -Continue calcitriol 0.5 mcg daily -Continue calcium and set 30 mg daily -Continue Velphoro (sucroferric oxyhydroxide) 500 mg 3 times daily with meals  Hypertension 131/100 most recently.  141/78 before that.   Home medication includes metoprolol 25 mg daily. -Continue metoprolol 25 mg daily  Gout History of gout.  No flares currently. Continue allopurinol 100 mg daily  HLD -Continue atorvastatin 40 mg daily  GERD Home medication includes omeprazole 40 mg twice daily. -Continue pantoprazole 80 mg daily  Anxiety Home medications include  venlafaxine 75 mg daily Xanax 1 mg 1-2 times daily as needed. -Continue venlafaxine Exar 75 mg daily -Continue Xanax 1 mg twice daily as needed  Nutrition: renal diet DVT px: subQ heparin  Disposition: home   Medications: Scheduled Meds: . allopurinol  100 mg Oral QHS  . atorvastatin  40 mg Oral QHS  . calcitRIOL  1 mcg Oral QHS  . gentamicin cream  1 application Topical Daily  . heparin  5,000 Units Subcutaneous Q8H  . metoprolol tartrate  25 mg Oral QHS  . multivitamin  1 tablet Oral QHS  . ondansetron  4 mg Oral Once  . pantoprazole  80 mg Oral Daily  . sucroferric oxyhydroxide  1,000 mg Oral TID WC  . venlafaxine XR  75 mg Oral QHS   Continuous Infusions: . cefTAZidime (FORTAZ)  IV    . dialysis solution 1.5% low-MG/low-CA    . dialysis solution 2.5% low-MG/low-CA    . vancomycin     PRN Meds: ALPRAZolam, dianeal solution for CAPD/CCPD with heparin, dianeal solution for CAPD/CCPD with heparin, HYDROmorphone, hydrOXYzine, promethazine  ================================================= ================================================= Subjective:  Patient having abdominal pain still.  She doesn't think the pain medication helped her this morning. s he is having loose black stools. She has some pain when she is draining her peritoneal dialysis fluid, but it goes away on its own.   Objective: Temp:  [97.7 F (36.5 C)-98.8 F (37.1 C)] 97.7 F (36.5 C) (03/30 0748) Pulse Rate:  [70-90] 70 (03/30 0748) Resp:  [7-27] 18 (03/30 0748) BP: (131-155)/(69-100) 131/100 (03/30 0748) SpO2:  [94 %-100 %] 97 % (03/30  9381) Weight:  [118.4 kg-120.4 kg] 120.4 kg (03/30 0012) Intake/Output 03/29 0701 - 03/30 0700 In: 120 [P.O.:90; IV Piggyback:30] Out: 25 [Urine:25] Physical Exam:  Gen: NAD, alert, non-toxic, well-nourished, well-appearing, sitting comfortably  CV: Regular rate and rhythm. Radial pulses 2+ bilaterally. No bilateral lower extremity edema. Resp: Clear to  auscultation bilaterally.  No wheezing, rales, abnormal lung sounds.  No increased work of breathing appreciated. Abd: TTP mostly over the LUQ and LLQ.   Positive bowel sounds. Psych: Cooperative with exam. Pleasant. Makes eye contact.   Laboratory: Recent Labs  Lab 03/30/18 0816 04/03/18 2055 04/04/18 0524  WBC 12.2* 9.3 8.3  HGB 11.6* 8.9* 8.8*  HCT 35.6* 27.7* 27.5*  PLT 416.0* 227 240   Recent Labs  Lab 04/03/18 2055 04/04/18 0524  NA 137 137  K 3.7 4.1  CL 99 96*  CO2 18* 20*  BUN 67* 68*  CREATININE 16.88* 16.14*  CALCIUM 7.2* 6.8*  PROT 6.7  --   BILITOT 0.8  --   ALKPHOS 57  --   ALT 14  --   AST 17  --   GLUCOSE 120* 121*    Imaging/Diagnostic Tests: No results found.    Benay Pike, MD 04/04/2018, 7:59 AM PGY-1, [place] Intern pager: [phone], text pages welcome

## 2018-04-05 DIAGNOSIS — R197 Diarrhea, unspecified: Secondary | ICD-10-CM

## 2018-04-05 LAB — BODY FLUID CELL COUNT WITH DIFFERENTIAL: Total Nucleated Cell Count, Fluid: 3 cu mm (ref 0–1000)

## 2018-04-05 LAB — CBC
HCT: 27.1 % — ABNORMAL LOW (ref 36.0–46.0)
Hemoglobin: 9 g/dL — ABNORMAL LOW (ref 12.0–15.0)
MCH: 28.9 pg (ref 26.0–34.0)
MCHC: 33.2 g/dL (ref 30.0–36.0)
MCV: 87.1 fL (ref 80.0–100.0)
Platelets: 209 10*3/uL (ref 150–400)
RBC: 3.11 MIL/uL — ABNORMAL LOW (ref 3.87–5.11)
RDW: 16.1 % — ABNORMAL HIGH (ref 11.5–15.5)
WBC: 6.7 10*3/uL (ref 4.0–10.5)
nRBC: 0.5 % — ABNORMAL HIGH (ref 0.0–0.2)

## 2018-04-05 LAB — RENAL FUNCTION PANEL
Albumin: 3 g/dL — ABNORMAL LOW (ref 3.5–5.0)
Anion gap: 18 — ABNORMAL HIGH (ref 5–15)
BUN: 51 mg/dL — ABNORMAL HIGH (ref 6–20)
CO2: 22 mmol/L (ref 22–32)
Calcium: 7.9 mg/dL — ABNORMAL LOW (ref 8.9–10.3)
Chloride: 94 mmol/L — ABNORMAL LOW (ref 98–111)
Creatinine, Ser: 14.03 mg/dL — ABNORMAL HIGH (ref 0.44–1.00)
GFR calc Af Amer: 3 mL/min — ABNORMAL LOW (ref >60)
GFR calc non Af Amer: 3 mL/min — ABNORMAL LOW (ref >60)
Glucose, Bld: 123 mg/dL — ABNORMAL HIGH (ref 70–99)
Phosphorus: 7.7 mg/dL — ABNORMAL HIGH (ref 2.5–4.6)
Potassium: 3.3 mmol/L — ABNORMAL LOW (ref 3.5–5.1)
Sodium: 134 mmol/L — ABNORMAL LOW (ref 135–145)

## 2018-04-05 LAB — URINE CULTURE

## 2018-04-05 LAB — LACTATE DEHYDROGENASE: LDH: 206 U/L — ABNORMAL HIGH (ref 98–192)

## 2018-04-05 LAB — IRON AND TIBC
Iron: 57 ug/dL (ref 28–170)
Saturation Ratios: 27 % (ref 10.4–31.8)
TIBC: 210 ug/dL — ABNORMAL LOW (ref 250–450)
UIBC: 153 ug/dL

## 2018-04-05 LAB — GRAM STAIN

## 2018-04-05 LAB — FERRITIN: Ferritin: 1000 ng/mL — ABNORMAL HIGH (ref 11–307)

## 2018-04-05 LAB — OCCULT BLOOD X 1 CARD TO LAB, STOOL: Fecal Occult Bld: POSITIVE — AB

## 2018-04-05 MED ORDER — MAGNESIUM SULFATE 2 GM/50ML IV SOLN
2.0000 g | Freq: Once | INTRAVENOUS | Status: AC
  Administered 2018-04-05: 2 g via INTRAVENOUS
  Filled 2018-04-05: qty 50

## 2018-04-05 MED ORDER — ALBUTEROL SULFATE (2.5 MG/3ML) 0.083% IN NEBU: 2.5000 mg | INHALATION_SOLUTION | Freq: Once | RESPIRATORY_TRACT | Status: DC

## 2018-04-05 MED ORDER — ALBUTEROL SULFATE (2.5 MG/3ML) 0.083% IN NEBU
2.5000 mg | INHALATION_SOLUTION | RESPIRATORY_TRACT | Status: DC
  Administered 2018-04-06 (×3): 2.5 mg via RESPIRATORY_TRACT
  Filled 2018-04-05 (×3): qty 3

## 2018-04-05 MED ORDER — OXYCODONE HCL 5 MG PO TABS
5.0000 mg | ORAL_TABLET | Freq: Four times a day (QID) | ORAL | Status: DC | PRN
  Administered 2018-04-05 – 2018-04-06 (×5): 5 mg via ORAL
  Filled 2018-04-05 (×5): qty 1

## 2018-04-05 MED ORDER — POTASSIUM CHLORIDE CRYS ER 20 MEQ PO TBCR
20.0000 meq | EXTENDED_RELEASE_TABLET | Freq: Once | ORAL | Status: AC
  Administered 2018-04-05: 20 meq via ORAL
  Filled 2018-04-05: qty 1

## 2018-04-05 MED ORDER — ALBUTEROL SULFATE (2.5 MG/3ML) 0.083% IN NEBU
2.5000 mg | INHALATION_SOLUTION | RESPIRATORY_TRACT | Status: DC | PRN
  Administered 2018-04-05: 2.5 mg via RESPIRATORY_TRACT
  Filled 2018-04-05: qty 3

## 2018-04-05 NOTE — Progress Notes (Addendum)
[facility] Progress Note   Dialysis Orders: PD orders: CCPD 7 days/ week EDW 108.5 kg  5 cycler exchanges 3L 2 hr dwell time last fill 3L 1 day exchange 3L 4 hr dwell time ( 6 exchanges in total)  Assessment/Plan: 1 Abd pain/ diarrhea/ dark stools: Had 6 watery stools prior to admisison.  None since here . STill having diffuse abdominal pain. Hgb 8.8, down form 11.6 3/25.  Repeat capsule endoscopy vs balloon enteroscopy has been considered.  Per primary and GI. Velphoro also contributes to dark stools - check FOBT again Abdominal CT unrevealing. No obvious explanation for abdominal pain. 2 ESRD: CCPD.  Recent PD fluid culture taken as an OP 3/26 was unrevealing--> Repeat cell count never done yesterday - Obtained today WBC was 3.   Net UF ~330 last pm - use 2.5s tonight  3 Hypertension.volume MTP 25 at HS -  4. Anemia of ESRD/ acute blood loss anemia: recurrent dark stools and worsening anemia.hgb 11.2 3/25.  Prior tsat 3/3 was 76% with ferritin 1639.   hgb 9  Stable 27% sat ferritin 1000 5. Metabolic Bone Disease: Velphoro 2 tablets TID AC, calcitriol 1 mcg daily Corrected Ca low 7.8 iPTH 98 6.  Nutrition: renal vitamin and supps 7. Hypokalemia - K 3.3  Give K 20 x 1   Myriam Jacobson, [facility] Beeper [phone] 04/05/2018,11:54 AM  LOS: 1 day   Pt seen, examined and agree w A/P as above.  [facility] 04/05/2018, 5:38 PM    Subjective:  No more diarrhea. Eating a small amount  Objective Vitals:   04/04/18 0748 04/04/18 2017 04/05/18 0633 04/05/18 1114  BP: (!) 131/100 (!) 162/96 130/73 (!) 152/99  Pulse: 70 70 76 78  Resp: $Remo'18 18 18 18  'VobcC$ Temp: 97.7 F (36.5 C) 98.2 F (36.8 C) 98 F (36.7 C) 98.3 F (36.8 C)  TempSrc: Oral Oral  Oral  SpO2: 97% 100% 96%   Weight:  125.2 kg    Height:       Physical Exam General: ill appearing Heart: RRR Lungs: no rales Abdomen: diffuse abdominal pain  Extremities: no LE  edema Dialysis Access:  PD cath   Additional Objective Labs: Basic Metabolic Panel: Recent Labs  Lab 04/03/18 2055 04/04/18 0524 04/05/18 0943  NA 137 137 134*  K 3.7 4.1 3.3*  CL 99 96* 94*  CO2 18* 20* 22  GLUCOSE 120* 121* 123*  BUN 67* 68* 51*  CREATININE 16.88* 16.14* 14.03*  CALCIUM 7.2* 6.8* 7.9*  PHOS  --  8.7* 7.7*   Liver Function Tests: Recent Labs  Lab 04/03/18 2055 04/05/18 0943  AST 17  --   ALT 14  --   ALKPHOS 57  --   BILITOT 0.8  --   PROT 6.7  --   ALBUMIN 2.9* 3.0*   Recent Labs  Lab 04/03/18 2055  LIPASE 36   CBC: Recent Labs  Lab 03/30/18 0816 04/03/18 2055 04/04/18 0524 04/04/18 1557 04/05/18 0943  WBC 12.2* 9.3 8.3  --  6.7  NEUTROABS  --  6.9  --   --   --   HGB 11.6* 8.9* 8.8* 8.7* 9.0*  HCT 35.6* 27.7* 27.5* 27.5* 27.1*  MCV 89.0 88.8 88.1  --  87.1  PLT 416.0* 227 240  --  209   Blood Culture    Component Value Date/Time   SDES URINE, CLEAN CATCH 04/03/2018 2319   SPECREQUEST  04/03/2018 2319  NONE Performed at [facility], [place] [address], [place], [place] [address]    CULT  04/03/2018 2319    Multiple bacterial morphotypes present, none predominant. Suggest appropriate recollection if clinically indicated.   REPTSTATUS 04/05/2018 FINAL 04/03/2018 2319    Cardiac Enzymes: No results for input(s): CKTOTAL, CKMB, CKMBINDEX, TROPONINI in the last 168 hours. CBG: No results for input(s): GLUCAP in the last 168 hours. Iron Studies:  Recent Labs    04/05/18 0943  IRON 57  TIBC 210*  FERRITIN 1,000*   Lab Results  Component Value Date   INR 1.07 02/26/2018   INR 0.98 12/22/2017   INR 1.04 04/14/2016   Studies/Results: Ct Abdomen Pelvis Wo Contrast  Result Date: 04/04/2018 CLINICAL DATA:  Abdominal pain with possible peritonitis or perforation. EXAM: CT ABDOMEN AND PELVIS WITHOUT CONTRAST TECHNIQUE: Multidetector CT imaging of the abdomen and pelvis was performed following the standard protocol  without IV contrast. COMPARISON:  CT abdomen pelvis 02/21/2018 FINDINGS: LOWER CHEST: There is no basilar pleural or apical pericardial effusion. HEPATOBILIARY: The hepatic contours and density are normal. There is no intra- or extrahepatic biliary dilatation. Gallbladder is distended PANCREAS: The pancreatic parenchymal contours are normal and there is no ductal dilatation. There is no peripancreatic fluid collection. SPLEEN: Normal. ADRENALS/URINARY TRACT: --Adrenal glands: Normal. --Right kidney/ureter: No hydronephrosis, nephroureterolithiasis, perinephric stranding or solid renal mass. --Left kidney/ureter: No hydronephrosis, nephroureterolithiasis, perinephric stranding or solid renal mass. --Urinary bladder: Normal for degree of distention STOMACH/BOWEL: --Stomach/Duodenum: There is no hiatal hernia or other gastric abnormality. The duodenal course and caliber are normal. --Small bowel: No dilatation or inflammation. --Colon: No focal abnormality. --Appendix: Normal. VASCULAR/LYMPHATIC: Normal course and caliber of the major abdominal vessels. No abdominal or pelvic lymphadenopathy. REPRODUCTIVE: Status post hysterectomy. No adnexal mass. MUSCULOSKELETAL. No bony spinal canal stenosis or focal osseous abnormality. OTHER: Peritoneal dialysis catheter is coiled in the low pelvis. Small volume pneumoperitoneum. Intermediate volume of free fluid throughout the abdomen. There is a small ventral abdominal hernia containing fat and a small amount of fluid. IMPRESSION: 1. Free fluid throughout the abdomen and small volume pneumoperitoneum, both of which may be secondary to peritoneal dialysis. 2. No dilated small bowel or evidence of colonic inflammation. 3. Distended gallbladder, nonspecific. Electronically Signed   By: Ulyses Jarred M.D.   On: 04/04/2018 19:46   Medications: . dialysis solution 1.5% low-MG/low-CA    . dialysis solution 2.5% low-MG/low-CA    . magnesium sulfate 1 - 4 g bolus IVPB 2 g (04/05/18  1148)   . acetaminophen  650 mg Oral Q6H  . allopurinol  100 mg Oral QHS  . atorvastatin  40 mg Oral QHS  . calcitRIOL  1 mcg Oral QHS  . gentamicin cream  1 application Topical Daily  . heparin  5,000 Units Subcutaneous Q8H  . metoprolol tartrate  25 mg Oral QHS  . multivitamin  1 tablet Oral QHS  . ondansetron  4 mg Oral Once  . pantoprazole  80 mg Oral Daily  . polyethylene glycol  17 g Oral Daily  . senna-docusate  1 tablet Oral QHS  . sucroferric oxyhydroxide  1,000 mg Oral TID WC  . venlafaxine XR  75 mg Oral QHS

## 2018-04-05 NOTE — Progress Notes (Signed)
S) Received a page at 10:07 regarding worsening wheezing.  Patient was assessed at bedside.  She reports that she has had mildly increased work of breathing.  She reports that she breathes more comfortably following breathing treatment with albuterol.  She denies any change with her breathing status moving from sitting to lying down.  She noted a mild cough that is new since admission.  She reports feeling hot then cold cold sweats at night though no objective fever.  She also noted a large black bowel movement today.  She reports that stomach pain she is currently having is similar to stomach pain she is previously experienced with a GI bleed though not exactly the same.  O)  Blood pressure (!) 142/84, pulse 80, temperature 98.4 F (36.9 C), temperature source Oral, resp. rate 19, height $RemoveBe'5\' 7"'IdvaKGttL$  (1.702 m), weight 127.4 kg, SpO2 98 %.  General: Lying in bed comfortably in no acute distress.  Able to complete long sentences without difficulty.  Reports mild anxiety. HEENT: No appreciable JVD, generous cervical tissue Maczis difficult to assess Pulmonary: Good air movement diffusely with mild expiratory wheezing noted in upper and middle fields, no crackles appreciated. Extremities: No lower extremity edema appreciated.  A/P)  43 year old woman with ESRD on PD in addition to symptomatic anemia now with mild respiratory symptoms.  Her increase work of breathing in the setting of her recent black bowel movement is concerning for a worsening GI bleed.  Will obtain stat CBC with type and screen to assess her need for transfusion overnight.  We will also schedule breathing treatments that seems to provide symptomatic relief.  She did not appear volume overloaded at this time.  We will continue to monitor her temperature for fever that might indicate an unaddressed infection.

## 2018-04-05 NOTE — Progress Notes (Signed)
Family Medicine Teaching Service Daily Progress Note Intern Pager: [phone]  [name] Medical record number: [id number] Date of birth: [birth year] Age: 43 y.o. Gender: female  Primary Care Provider: Blair Heys, PA-C Consultants: nephrology Code Status: Full code  Pt Overview and Major Events to Date:  Hospital Day 1 Admitted: 04/03/2018  Assessment and Plan: Patricia [name] is a 43 y.o. female presenting with 3 days of abdominal pain.  Past medical history is significant for ESRD on peritoneal dialysis, recent peritonitis, hypertension, paroxysmal atrial tachycardia, arteriovenous malformation of the small bowel.  Abdominal pain concerning for recurrent peritonitis -  3 day hx of abdmonial pain.  VSS, hypertensive, afebrile, no leukocytosis, LA normal.  Electrolytes very abnormal, likely from inadequate dialysis:  Cr 16, BUN 68, Ca 6.8, bicarb 20, AG 21. Recent d/c on 2/21 for peritonitis. CT scan not showing abscess, is showing pneumoperitoneum/free fluid in abdomen, both of which could be 2/2 peritoneal dialysis. Urinalysis did not indicate UTI. nephro does not think it is peritonitis at this point.   - consulted nephro, appreciate recs - consult GI - cell count/abd fluid cx pending,  - blood/urine cx - NG24hr for blood.  - s/p one time dose of vanc/ceftaz.   - phenergan for nausea - d/c dilaudid PO $RemoveBef'1mg'liPfvTKLMh$  q4h PRN - start oxycodone PO $RemoveBefo'5mg'WGxrWucWnlP$  q6h  Anemia 2/2 acute GI bleed possibly - hx of AVMs. 11.6hgb  Now 8.9>8.7> 9.0.   Frequent loose, black stools.   - transfuse if under 7.    - CBC daily.    ESRD on PD - nephro consulted.   -Continue calcitriol 0.5 mcg daily -Continue calcium and set 30 mg daily -Continue Velphoro (sucroferric oxyhydroxide) 500 mg 3 times daily with meals  Hypertension 130/73.    Home medication includes metoprolol 25 mg daily. -Continue metoprolol 25 mg daily  Gout History of gout.  No flares currently. Continue allopurinol 100 mg  daily  HLD -Continue atorvastatin 40 mg daily  GERD Home medication includes omeprazole 40 mg twice daily. -Continue pantoprazole 80 mg daily  Anxiety Home medications include venlafaxine 75 mg daily Xanax 1 mg 1-2 times daily as needed. -Continue venlafaxine Exar 75 mg daily -Continue Xanax 1 mg twice daily as needed  Nutrition: renal diet DVT px: subQ heparin  Disposition: home   Medications: Scheduled Meds: . acetaminophen  650 mg Oral Q6H  . allopurinol  100 mg Oral QHS  . atorvastatin  40 mg Oral QHS  . calcitRIOL  1 mcg Oral QHS  . gentamicin cream  1 application Topical Daily  . heparin  5,000 Units Subcutaneous Q8H  . metoprolol tartrate  25 mg Oral QHS  . multivitamin  1 tablet Oral QHS  . ondansetron  4 mg Oral Once  . pantoprazole  80 mg Oral Daily  . polyethylene glycol  17 g Oral Daily  . senna-docusate  1 tablet Oral QHS  . sucroferric oxyhydroxide  1,000 mg Oral TID WC  . venlafaxine XR  75 mg Oral QHS   Continuous Infusions: . dialysis solution 1.5% low-MG/low-CA    . dialysis solution 2.5% low-MG/low-CA     PRN Meds: ALPRAZolam, dianeal solution for CAPD/CCPD with heparin, dianeal solution for CAPD/CCPD with heparin, HYDROmorphone, hydrOXYzine, promethazine  ================================================= ================================================= Subjective:  Patient has been having nausea, no vomiting.  Patient states her abdominal pain is the same.  The dilaudid helps somewhat, but the tylenol does not help.    Objective: Temp:  [97.7 F (36.5 C)-98.2 F (36.8  C)] 98.2 F (36.8 C) (03/30 2017) Pulse Rate:  [70] 70 (03/30 2017) Resp:  [18] 18 (03/30 2017) BP: (131-162)/(96-100) 162/96 (03/30 2017) SpO2:  [97 %-100 %] 100 % (03/30 2017) Weight:  [125.2 kg] 125.2 kg (03/30 2017) Intake/Output 03/30 0701 - 03/31 0700 In: 5631 [P.O.:1140; IV Piggyback:100] Out: 0  Physical Exam:  Gen: NAD, alert, non-toxic, getting PD CV:  Regular rate and rhythm. Radial pulses 2+ bilaterally. No bilateral lower extremity edema. Resp: minimal wheeze in LUQ.  No increased work of breathing appreciated. Pt was on 2L o2 but did not appear to be in any respiratory distress.  Abd: TTP mostly over the LLQ,  LUQ and LLQ.   Positive bowel sounds. Psych: Cooperative with exam. Pleasant. Makes eye contact.   Laboratory: Recent Labs  Lab 03/30/18 0816 04/03/18 2055 04/04/18 0524 04/04/18 1557  WBC 12.2* 9.3 8.3  --   HGB 11.6* 8.9* 8.8* 8.7*  HCT 35.6* 27.7* 27.5* 27.5*  PLT 416.0* 227 240  --    Recent Labs  Lab 04/03/18 2055 04/04/18 0524  NA 137 137  K 3.7 4.1  CL 99 96*  CO2 18* 20*  BUN 67* 68*  CREATININE 16.88* 16.14*  CALCIUM 7.2* 6.8*  PROT 6.7  --   BILITOT 0.8  --   ALKPHOS 57  --   ALT 14  --   AST 17  --   GLUCOSE 120* 121*    Imaging/Diagnostic Tests: Ct Abdomen Pelvis Wo Contrast  Result Date: 04/04/2018 CLINICAL DATA:  Abdominal pain with possible peritonitis or perforation. EXAM: CT ABDOMEN AND PELVIS WITHOUT CONTRAST TECHNIQUE: Multidetector CT imaging of the abdomen and pelvis was performed following the standard protocol without IV contrast. COMPARISON:  CT abdomen pelvis 02/21/2018 FINDINGS: LOWER CHEST: There is no basilar pleural or apical pericardial effusion. HEPATOBILIARY: The hepatic contours and density are normal. There is no intra- or extrahepatic biliary dilatation. Gallbladder is distended PANCREAS: The pancreatic parenchymal contours are normal and there is no ductal dilatation. There is no peripancreatic fluid collection. SPLEEN: Normal. ADRENALS/URINARY TRACT: --Adrenal glands: Normal. --Right kidney/ureter: No hydronephrosis, nephroureterolithiasis, perinephric stranding or solid renal mass. --Left kidney/ureter: No hydronephrosis, nephroureterolithiasis, perinephric stranding or solid renal mass. --Urinary bladder: Normal for degree of distention STOMACH/BOWEL: --Stomach/Duodenum: There  is no hiatal hernia or other gastric abnormality. The duodenal course and caliber are normal. --Small bowel: No dilatation or inflammation. --Colon: No focal abnormality. --Appendix: Normal. VASCULAR/LYMPHATIC: Normal course and caliber of the major abdominal vessels. No abdominal or pelvic lymphadenopathy. REPRODUCTIVE: Status post hysterectomy. No adnexal mass. MUSCULOSKELETAL. No bony spinal canal stenosis or focal osseous abnormality. OTHER: Peritoneal dialysis catheter is coiled in the low pelvis. Small volume pneumoperitoneum. Intermediate volume of free fluid throughout the abdomen. There is a small ventral abdominal hernia containing fat and a small amount of fluid. IMPRESSION: 1. Free fluid throughout the abdomen and small volume pneumoperitoneum, both of which may be secondary to peritoneal dialysis. 2. No dilated small bowel or evidence of colonic inflammation. 3. Distended gallbladder, nonspecific. Electronically Signed   By: Ulyses Jarred M.D.   On: 04/04/2018 19:46      Benay Pike, MD 04/05/2018, 6:29 AM PGY-1, [place] Intern pager: [phone], text pages welcome

## 2018-04-05 NOTE — Discharge Summary (Signed)
[facility] Discharge Summary  [name] Medical record number: [id number] Date of birth: 04/19/1975 Age: 43 y.o. Gender: female Date of Admission: 04/03/2018  Date of Discharge: 04/07/18 Admitting Physician: Martyn Malay, MD  Primary Care Provider: Blair Heys, PA-C Consultants: nephrology, GI  Indication for Hospitalization: abdominal pain  Discharge Diagnoses/Problem List:  Abdominal Pain Normocytic anemia SOB ESRD on PD Chronic Hypertension Gout HLD GERD Anxiety  Disposition: home  Discharge Condition: stable  Discharge Exam:  General: NAD, pleasant, lying flat in bed receiving PD Cardiovascular: RRR, no m/r/g, no LE edema Respiratory: CTA BL, normal work of breathing Gastrointestinal: soft, diffusely tender, nondistended, normoactive BS MSK: moves 4 extremities equally Derm: no rashes appreciated Neuro: CN II-XII grossly intact Psych: AOx3, appropriate affect  Brief Hospital Course:  Patient was admitted to the hospital for abdominal pain concerning for peritonitis associated with her peritoneal dialysis. She had recently been in the hospital the prior month for peritonitis.  Nephrology was consulted and she was given one dose of antibiotics with peritoneal dialysis, but was then discontinued when it was believed she did not have peritonitis due to her lack of fever, leukocytosis, and negative cell count. Nephrology recommended continuing home PD prior to d/c. Abdominal pain remained stable and unchanged throughout admission. Not improved, but back to home baseline  GI was consulted due to patient hgb dropped from >11 to ~8.5 in a span of 5 days.  She complained of black diarrhea for a few days prior to admission.  During admission her hgb was stable around 8.5-9.4. Believed that 11 hgb was spurious results. Patient noted to be at baseline and on iron supplementation so will continue outpatient work up.   Issues for  Follow Up:  1. Consider further outpatient work up given continued anemia with baseline of 8-9. Ferritin has been high and iron normal last several checks. Recommend stopping iron and searching for different sources of anemia other than iron deficiency/GI bleed.  ESRD noted, but still lower than one might expect. 2. Patient to continue home peritoneal dialysis and follow with nephrology outpatient.  3. Patient has lisinopril listed as home med for prn htn, unclear why as she is on PD. Did not continue on d/c. 4. Recommend patient being weaned off of xanax for better long term control of anxiety.   Significant Procedures: none  Significant Labs and Imaging:  Recent Labs  Lab 04/05/18 2338 04/06/18 0444 04/07/18 1150  WBC 6.9 7.8 8.0  HGB 9.0* 8.6* 9.4*  HCT 28.3* 28.2* 30.0*  PLT 258 243 263   Recent Labs  Lab 04/03/18 2055 04/04/18 0524 04/05/18 0943 04/06/18 0444 04/07/18 1150  NA 137 137 134* 134* 136  K 3.7 4.1 3.3* 2.8* 3.5  CL 99 96* 94* 92* 98  CO2 18* 20* $Remov'22 22 24  'BZLHTX$ GLUCOSE 120* 121* 123* 162* 133*  BUN 67* 68* 51* 44* 36*  CREATININE 16.88* 16.14* 14.03* 13.65* 14.27*  CALCIUM 7.2* 6.8* 7.9* 8.5* 8.5*  MG  --  1.5*  --  1.8  --   PHOS  --  8.7* 7.7*  --   --   ALKPHOS 57  --   --   --  50  AST 17  --   --   --  23  ALT 14  --   --   --  15  ALBUMIN 2.9*  --  3.0*  --  3.0*   Ct Abdomen Pelvis Wo Contrast  Result Date:  04/04/2018 CLINICAL DATA:  Abdominal pain with possible peritonitis or perforation. EXAM: CT ABDOMEN AND PELVIS WITHOUT CONTRAST TECHNIQUE: Multidetector CT imaging of the abdomen and pelvis was performed following the standard protocol without IV contrast. COMPARISON:  CT abdomen pelvis 02/21/2018 FINDINGS: LOWER CHEST: There is no basilar pleural or apical pericardial effusion. HEPATOBILIARY: The hepatic contours and density are normal. There is no intra- or extrahepatic biliary dilatation. Gallbladder is distended PANCREAS: The pancreatic parenchymal  contours are normal and there is no ductal dilatation. There is no peripancreatic fluid collection. SPLEEN: Normal. ADRENALS/URINARY TRACT: --Adrenal glands: Normal. --Right kidney/ureter: No hydronephrosis, nephroureterolithiasis, perinephric stranding or solid renal mass. --Left kidney/ureter: No hydronephrosis, nephroureterolithiasis, perinephric stranding or solid renal mass. --Urinary bladder: Normal for degree of distention STOMACH/BOWEL: --Stomach/Duodenum: There is no hiatal hernia or other gastric abnormality. The duodenal course and caliber are normal. --Small bowel: No dilatation or inflammation. --Colon: No focal abnormality. --Appendix: Normal. VASCULAR/LYMPHATIC: Normal course and caliber of the major abdominal vessels. No abdominal or pelvic lymphadenopathy. REPRODUCTIVE: Status post hysterectomy. No adnexal mass. MUSCULOSKELETAL. No bony spinal canal stenosis or focal osseous abnormality. OTHER: Peritoneal dialysis catheter is coiled in the low pelvis. Small volume pneumoperitoneum. Intermediate volume of free fluid throughout the abdomen. There is a small ventral abdominal hernia containing fat and a small amount of fluid. IMPRESSION: 1. Free fluid throughout the abdomen and small volume pneumoperitoneum, both of which may be secondary to peritoneal dialysis. 2. No dilated small bowel or evidence of colonic inflammation. 3. Distended gallbladder, nonspecific. Electronically Signed   By: Ulyses Jarred M.D.   On: 04/04/2018 19:46   Results/Tests Pending at Time of Discharge: none  Discharge Medications:  Allergies as of 04/07/2018      Reactions   Nsaids Other (See Comments)   Cannot take due to Kidney disease/Kidney function   Tolmetin Other (See Comments)   Cannot take due to Kidney Disease      Medication List    STOP taking these medications   diphenhydramine-acetaminophen 25-500 MG Tabs tablet Commonly known as:  TYLENOL PM   lisinopril 20 MG tablet Commonly known as:   PRINIVIL,ZESTRIL   methocarbamol 500 MG tablet Commonly known as:  ROBAXIN     TAKE these medications   allopurinol 100 MG tablet Commonly known as:  ZYLOPRIM Take 100 mg by mouth at bedtime.   ALPRAZolam 1 MG tablet Commonly known as:  XANAX Take 1 mg by mouth 2 (two) times daily as needed for anxiety.   atorvastatin 40 MG tablet Commonly known as:  LIPITOR Take 40 mg by mouth at bedtime.   calcitRIOL 0.5 MCG capsule Commonly known as:  ROCALTROL Take 1 capsule (0.5 mcg total) by mouth daily with breakfast. What changed:  when to take this   cinacalcet 30 MG tablet Commonly known as:  SENSIPAR Take 1 tablet (30 mg total) by mouth daily. What changed:  when to take this   diclofenac sodium 1 % Gel Commonly known as:  VOLTAREN Apply 1 application topically 4 (four) times daily as needed for pain.   gentamicin ointment 0.1 % Commonly known as:  GARAMYCIN Apply 1 application topically daily as needed (changing dressing.).   hydrOXYzine 25 MG tablet Commonly known as:  ATARAX/VISTARIL Take 25 mg by mouth every 6 (six) hours as needed for itching.   metoprolol tartrate 25 MG tablet Commonly known as:  LOPRESSOR Take 25 mg by mouth at bedtime.   multivitamin Tabs tablet Take 1 tablet by mouth at bedtime.  omeprazole 40 MG capsule Commonly known as:  PRILOSEC Take 40 mg by mouth 2 (two) times daily.   polyethylene glycol packet Commonly known as:  MIRALAX / GLYCOLAX Take 17 g by mouth daily as needed (constipation).   prochlorperazine 10 MG tablet Commonly known as:  COMPAZINE Take 1 tablet (10 mg total) by mouth every 8 (eight) hours as needed for nausea or vomiting.   prochlorperazine 25 MG suppository Commonly known as:  COMPAZINE Place 25 mg rectally 2 (two) times daily as needed for nausea/vomiting.   promethazine 25 MG tablet Commonly known as:  PHENERGAN Take 1 tablet (25 mg total) by mouth every 8 (eight) hours as needed for nausea or vomiting.    senna 8.6 MG Tabs tablet Commonly known as:  SENOKOT Take 2 tablets (17.2 mg total) by mouth at bedtime. What changed:    when to take this  reasons to take this   senna-docusate 8.6-50 MG tablet Commonly known as:  Senokot-S Take 1 tablet by mouth at bedtime.   Velphoro 500 MG chewable tablet Generic drug:  sucroferric oxyhydroxide Chew 1,000 mg by mouth 3 (three) times daily before meals.   venlafaxine XR 75 MG 24 hr capsule Commonly known as:  EFFEXOR-XR Take 75 mg by mouth at bedtime.       Discharge Instructions: Please refer to Patient Instructions section of EMR for full details.  Patient was counseled important signs and symptoms that should prompt return to medical care, changes in medications, dietary instructions, activity restrictions, and follow up appointments.   Follow-Up Appointments:   Adelynn Gipe, [place], DO 04/07/2018, 2:44 PM PGY-2, [place]

## 2018-04-05 NOTE — Progress Notes (Signed)
Per pt "wheezing got worse"; VS WDL. RT at the bedside. Breathing treatment started. Pt is very anxious. MD is notified. Will continue to monitor.

## 2018-04-06 ENCOUNTER — Inpatient Hospital Stay (HOSPITAL_COMMUNITY): Payer: Medicare Other

## 2018-04-06 LAB — CBC
HCT: 28.3 % — ABNORMAL LOW (ref 36.0–46.0)
HEMATOCRIT: 28.2 % — AB (ref 36.0–46.0)
Hemoglobin: 8.6 g/dL — ABNORMAL LOW (ref 12.0–15.0)
Hemoglobin: 9 g/dL — ABNORMAL LOW (ref 12.0–15.0)
MCH: 26.8 pg (ref 26.0–34.0)
MCH: 27.8 pg (ref 26.0–34.0)
MCHC: 30.5 g/dL (ref 30.0–36.0)
MCHC: 31.8 g/dL (ref 30.0–36.0)
MCV: 87.9 fL (ref 80.0–100.0)
MCV: 87.3 fL (ref 80.0–100.0)
Platelets: 243 10*3/uL (ref 150–400)
Platelets: 258 10*3/uL (ref 150–400)
RBC: 3.21 MIL/uL — ABNORMAL LOW (ref 3.87–5.11)
RBC: 3.24 MIL/uL — ABNORMAL LOW (ref 3.87–5.11)
RDW: 15.9 % — ABNORMAL HIGH (ref 11.5–15.5)
RDW: 16.1 % — AB (ref 11.5–15.5)
WBC: 6.9 10*3/uL (ref 4.0–10.5)
WBC: 7.8 10*3/uL (ref 4.0–10.5)
nRBC: 0.3 % — ABNORMAL HIGH (ref 0.0–0.2)
nRBC: 0.6 % — ABNORMAL HIGH (ref 0.0–0.2)

## 2018-04-06 LAB — BASIC METABOLIC PANEL
Anion gap: 20 — ABNORMAL HIGH (ref 5–15)
BUN: 44 mg/dL — ABNORMAL HIGH (ref 6–20)
CO2: 22 mmol/L (ref 22–32)
Calcium: 8.5 mg/dL — ABNORMAL LOW (ref 8.9–10.3)
Chloride: 92 mmol/L — ABNORMAL LOW (ref 98–111)
Creatinine, Ser: 13.65 mg/dL — ABNORMAL HIGH (ref 0.44–1.00)
GFR calc Af Amer: 3 mL/min — ABNORMAL LOW (ref >60)
GFR calc non Af Amer: 3 mL/min — ABNORMAL LOW (ref >60)
Glucose, Bld: 162 mg/dL — ABNORMAL HIGH (ref 70–99)
Potassium: 2.8 mmol/L — ABNORMAL LOW (ref 3.5–5.1)
Sodium: 134 mmol/L — ABNORMAL LOW (ref 135–145)

## 2018-04-06 LAB — TYPE AND SCREEN
ABO/RH(D): O NEG
Antibody Screen: NEGATIVE

## 2018-04-06 LAB — FOLATE RBC
Folate, Hemolysate: >620 ng/mL
Folate, RBC: >2255 ng/mL (ref >498)
HEMATOCRIT: 27.5 % — AB (ref 34.0–46.6)

## 2018-04-06 LAB — HAPTOGLOBIN: Haptoglobin: 393 mg/dL — ABNORMAL HIGH (ref 42–296)

## 2018-04-06 LAB — MAGNESIUM: Magnesium: 1.8 mg/dL (ref 1.7–2.4)

## 2018-04-06 MED ORDER — ALBUTEROL SULFATE (2.5 MG/3ML) 0.083% IN NEBU: 2.5000 mg | INHALATION_SOLUTION | RESPIRATORY_TRACT | Status: DC | PRN

## 2018-04-06 MED ORDER — PROMETHAZINE HCL 25 MG/ML IJ SOLN
12.5000 mg | Freq: Once | INTRAMUSCULAR | Status: AC
  Administered 2018-04-06: 12.5 mg via INTRAVENOUS
  Filled 2018-04-06: qty 1

## 2018-04-06 MED ORDER — ONDANSETRON HCL 4 MG/2ML IJ SOLN
4.0000 mg | Freq: Three times a day (TID) | INTRAMUSCULAR | Status: DC | PRN
  Administered 2018-04-06: 4 mg via INTRAVENOUS
  Filled 2018-04-06 (×2): qty 2

## 2018-04-06 MED ORDER — ALBUTEROL SULFATE (2.5 MG/3ML) 0.083% IN NEBU
2.5000 mg | INHALATION_SOLUTION | Freq: Three times a day (TID) | RESPIRATORY_TRACT | Status: DC
  Administered 2018-04-06 (×2): 2.5 mg via RESPIRATORY_TRACT
  Filled 2018-04-06: qty 3

## 2018-04-06 MED ORDER — POTASSIUM CHLORIDE CRYS ER 20 MEQ PO TBCR
40.0000 meq | EXTENDED_RELEASE_TABLET | Freq: Two times a day (BID) | ORAL | Status: DC
  Administered 2018-04-06: 40 meq via ORAL
  Filled 2018-04-06: qty 2

## 2018-04-06 NOTE — Progress Notes (Addendum)
[facility] Progress Note   Dialysis Orders: PD orders: CCPD 7 days/ week EDW ~118.5 kg  5 cycler exchanges 3L 2 hr dwell time last fill 3L 1 day exchange 3L 4 hr dwell time ( 6 exchanges in total)  Assessment/Plan: 1 Abd pain/ diarrhea/ dark stools: Had 6 watery stools prior to admisison.  None since here . STill having diffuse abdominal pain. Hgb 8.8, down form 11.6 3/25.  Repeat capsule endoscopy vs balloon enteroscopy has been considered.  Per primary and GI. Velphoro also contributes to dark stools - check FOBT again Abdominal CT unrevealing. No obvious explanation for abdominal pain. Per primary.  2 ESRD: CCPD.  Recent PD fluid culture taken as an OP 3/26 was unrevealing--> Cell count 3/31 WBC was 3.  UF 1524 3/31  Net UF 2574 not weighed this am. Have asked RN to get a standing wt pre and post HD with orthostatics -  use all 2.5s tonight 3 Hypertension/volume MTP 25 at HS - Volume significantly up by weights 4. Anemia of ESRD/ acute blood loss anemia: recurrent dark stools and worsening anemia.hgb 11.2 3/25.  Prior tsat 3/3 was 76% with ferritin 1639.   hgb 9  > 8.6 Stable 27% sat ferritin 1000 5. Metabolic Bone Disease: Velphoro 2 tablets TID AC, calcitriol 1 mcg daily Corrected [place]  iPTH 98 6.  Nutrition: renal vitamin and supps 7. Hypokalemia - K 2.8 liberalized diet to heart healthy ^ K intake KCsupp reordered 8. Deconditioned - need mobilization  9. Anxiety -    Sheffield Slider, PA-C  [facility] [phone] 04/06/2018,10:37 AM  LOS: 2 days   Pt seen, examined and agree w A/P as above. Small volume free air on CXR today is consistent w/ PD patient (seen on abd CT as well), doubt perf viscous. CXR otherwise wnl. Will UF more volume as wt's are up here.  Rob [employer] [facility] 04/06/2018, 3:38 PM    Subjective:  Nauseated with vomiting this am.  Gets DOE with exertion.  This happens at home but I just keep going. Episode with  wheezing last night - she isn't clear what happened.  Objective Vitals:   04/06/18 0505 04/06/18 0600 04/06/18 0747 04/06/18 0920  BP: 138/80   137/85  Pulse: 80   (!) 102  Resp: 18   20  Temp: 98 F (36.7 C)     TempSrc: Oral     SpO2: 98%  98% 97%  Weight:  125.2 kg    Height:       Physical Exam General: ill appearing but more engaging today Heart: tachy reg Lungs: no rales or wheezes Abdomen:  More left sided todayabdominal pain today Extremities: no LE edema Dialysis Access:  PD cath   Additional Objective Labs: Basic Metabolic Panel: Recent Labs  Lab 04/04/18 0524 04/05/18 0943 04/06/18 0444  NA 137 134* 134*  K 4.1 3.3* 2.8*  CL 96* 94* 92*  CO2 20* 22 22  GLUCOSE 121* 123* 162*  BUN 68* 51* 44*  CREATININE 16.14* 14.03* 13.65*  CALCIUM 6.8* 7.9* 8.5*  PHOS 8.7* 7.7*  --    Liver Function Tests: Recent Labs  Lab 04/03/18 2055 04/05/18 0943  AST 17  --   ALT 14  --   ALKPHOS 57  --   BILITOT 0.8  --   PROT 6.7  --   ALBUMIN 2.9* 3.0*   Recent Labs  Lab 04/03/18 2055  LIPASE 36   CBC: Recent Labs  Lab 04/03/18 2055 04/04/18 0524  04/05/18 0943 04/05/18 2338 04/06/18 0444  WBC 9.3 8.3  --  6.7 6.9 7.8  NEUTROABS 6.9  --   --   --   --   --   HGB 8.9* 8.8*   < > 9.0* 9.0* 8.6*  HCT 27.7* 27.5*   < > 27.1* 28.3* 28.2*  MCV 88.8 88.1  --  87.1 87.3 87.9  PLT 227 240  --  209 258 243   < > = values in this interval not displayed.   Blood Culture    Component Value Date/Time   SDES PERITONEAL DIALYSATE 04/05/2018 1130   SPECREQUEST NONE 04/05/2018 1130   CULT  04/03/2018 2319    Multiple bacterial morphotypes present, none predominant. Suggest appropriate recollection if clinically indicated.   REPTSTATUS 04/05/2018 FINAL 04/05/2018 1130    Iron Studies:  Recent Labs    04/05/18 0943  IRON 57  TIBC 210*  FERRITIN 1,000*   Lab Results  Component Value Date   INR 1.07 02/26/2018   INR 0.98 12/22/2017   INR 1.04 04/14/2016    Studies/Results: Ct Abdomen Pelvis Wo Contrast  Result Date: 04/04/2018 CLINICAL DATA:  Abdominal pain with possible peritonitis or perforation. EXAM: CT ABDOMEN AND PELVIS WITHOUT CONTRAST TECHNIQUE: Multidetector CT imaging of the abdomen and pelvis was performed following the standard protocol without IV contrast. COMPARISON:  CT abdomen pelvis 02/21/2018 FINDINGS: LOWER CHEST: There is no basilar pleural or apical pericardial effusion. HEPATOBILIARY: The hepatic contours and density are normal. There is no intra- or extrahepatic biliary dilatation. Gallbladder is distended PANCREAS: The pancreatic parenchymal contours are normal and there is no ductal dilatation. There is no peripancreatic fluid collection. SPLEEN: Normal. ADRENALS/URINARY TRACT: --Adrenal glands: Normal. --Right kidney/ureter: No hydronephrosis, nephroureterolithiasis, perinephric stranding or solid renal mass. --Left kidney/ureter: No hydronephrosis, nephroureterolithiasis, perinephric stranding or solid renal mass. --Urinary bladder: Normal for degree of distention STOMACH/BOWEL: --Stomach/Duodenum: There is no hiatal hernia or other gastric abnormality. The duodenal course and caliber are normal. --Small bowel: No dilatation or inflammation. --Colon: No focal abnormality. --Appendix: Normal. VASCULAR/LYMPHATIC: Normal course and caliber of the major abdominal vessels. No abdominal or pelvic lymphadenopathy. REPRODUCTIVE: Status post hysterectomy. No adnexal mass. MUSCULOSKELETAL. No bony spinal canal stenosis or focal osseous abnormality. OTHER: Peritoneal dialysis catheter is coiled in the low pelvis. Small volume pneumoperitoneum. Intermediate volume of free fluid throughout the abdomen. There is a small ventral abdominal hernia containing fat and a small amount of fluid. IMPRESSION: 1. Free fluid throughout the abdomen and small volume pneumoperitoneum, both of which may be secondary to peritoneal dialysis. 2. No dilated small  bowel or evidence of colonic inflammation. 3. Distended gallbladder, nonspecific. Electronically Signed   By: Deatra Robinson M.D.   On: 04/04/2018 19:46   Medications: . dialysis solution 1.5% low-MG/low-CA    . dialysis solution 2.5% low-MG/low-CA     . acetaminophen  650 mg Oral Q6H  . albuterol  2.5 mg Nebulization Q4H  . allopurinol  100 mg Oral QHS  . atorvastatin  40 mg Oral QHS  . calcitRIOL  1 mcg Oral QHS  . gentamicin cream  1 application Topical Daily  . heparin  5,000 Units Subcutaneous Q8H  . metoprolol tartrate  25 mg Oral QHS  . multivitamin  1 tablet Oral QHS  . ondansetron  4 mg Oral Once  . pantoprazole  80 mg Oral Daily  . polyethylene glycol  17 g Oral Daily  . potassium chloride  40  mEq Oral BID  . senna-docusate  1 tablet Oral QHS  . sucroferric oxyhydroxide  1,000 mg Oral TID WC  . venlafaxine XR  75 mg Oral QHS

## 2018-04-06 NOTE — Progress Notes (Signed)
Family Medicine Teaching Service Daily Progress Note Intern Pager: [phone]  [name] Medical record number: [id number] Date of birth: 10-14-1975 Age: 43 y.o. Gender: female  Primary Care Provider: Blair Heys, PA-C Consultants: Nephrology  Code Status: Full   Assessment and Plan: Patricia Danielsis a 43 y.o.femalepresenting with 3 days of abdominal pain. Past medical history is significant for ESRD onperitoneal dialysis,recent peritonitis, hypertension, paroxysmal atrial tachycardia, arteriovenous malformation of the small bowel.  Abdominal pain: Improving.  Improved pain this morning, still nauseous with minimal appetite. Continued melena.  Tenderness with palpation in her left quadrants near peritoneal dialysis site, however overlying skin changes.  Peritoneal fluid no growth <24 hrs with 3 WBC, while this was collected s/p short time of antibiotics, would have still expected some abnormality to be present if peritonitis.  CT abdomen unremarkable, making intra-abdominal etiology such as appendicitis, diverticulitis unlikely.  Potentially related to concerns as discussed below with concurrent melena.  - Consult GI, appreciate further recommendations - Follow-up peritoneal fluid and blood cultures - Zofran IV/p.o. as needed for nausea - F/U stool studies collected via GI outpatient - Oxycodone p.o. 5 mg every 6 as needed for severe pain  Normocytic anemia, concern for acute GI bleed with associated melena: Stable.  In setting of history of diagnosed AVMs.  Hemoglobin stable, 8.6 this morning from 9. 11.6 on admit.  One dark, formed stool yesterday, improved from previous report of loose and frequent (however is also taking oxycodone for pain).  Follows with [facility], recently had a visit with Dr. Rush Landmark who was considering potential repeat capsule endoscopy versus device assisted enteroscopy. - Consult GI, appreciate recommendations - Monitor CBC daily,  additional as needed - Transfuse <7  - Follow-up stool stool studies collected via GI office on 3/25  SOB: Acute, stable.  Episode of acute SOB with associated wheezing overnight, continues to endorse increased dyspnea on exertion. Improved with albuterol inhaler. No history of asthma/COPD, non-smoker. Differential including anxiety vs symptomatic anemia vs lung etiology vs fluid overload in ESRD patient with peritoneal dialysis. Ultimately may be multifactorial. Reassured no pleural effusions on CT scan, however weight significantly up since admission (~16 lbs).  Considered PE, however on heparin and reassured she was comfortable on exam this morning, heart rate 80s. - Obtain 2 view CXR - Albuterol PRN - Continue to monitor symptoms - Monitor daily CBC - Fluid restriction, 1.2 L  ESRD on PD: Daily dialysis. - Nephrology on board, appreciate recommendations  - Heart healthy diet, 1.2L fluid restriction -Continue calcitriol 0.5 mcg daily -Continue calcium and set 30 mg daily -ContinueVelphoro (sucroferric oxyhydroxide)500 mg 3 times daily with meals  Chronic Hypertension: Stable.  SBP 130-150.  - Continue home metoprolol 25 mg daily  Gout: Chronic, stable. No flares currently. -Continue allopurinol 100 mg daily  HLD: Stable. -Continue atorvastatin 40 mg daily  GERD: Stable -Continue pantoprazole 80 mg daily (substitute for omeprazole 40 mg twice daily)   Anxiety: Stable. -Continue venlafaxine 75 mg daily -Continue Xanax 1 mg twice daily as needed  Nutrition: renal diet DVT px: subQ heparin  Disposition: home  Subjective:  Interval events: Had an episode overnight of increased work of breathing and wheezing, improved with albuterol inhaler.  Thought to be in the setting of increased anxiety vs worsening anemia, however hemoglobin stable at that time.  This morning she is feeling slightly better.  Abdominal pain improved, however still present and nagging like,  only on the left side.  Has felt nauseous, was able  to eat half a muffin this morning. 1 BM yesterday, dark, large, formed.  Denies any emesis.  Additionally reports she continues to feel more short of breath with exertion, short distances.  Has no shortness of breath while laying flat in the bed.  Objective: Temp:  [97.7 F (36.5 C)-98.4 F (36.9 C)] 98 F (36.7 C) (04/01 0505) Pulse Rate:  [78-82] 80 (04/01 0505) Resp:  [18-20] 18 (04/01 0505) BP: (138-157)/(80-99) 138/80 (04/01 0505) SpO2:  [98 %] 98 % (04/01 0505) Weight:  [125.2 kg-127.4 kg] 125.2 kg (04/01 0600) Physical Exam: General: Alert, nontoxic appearing, laying comfortably flat in bed HEENT: NCAT, MMM Cardiac: RRR no m/g/r Lungs: Clear bilaterally, with no increased work of breathing on room air, did not appreciate any wheezing, rhonchi, rales. Abdomen: Soft large abdomen, tenderness to light palpation in left quadrants without rebounding or guarding.  Peritoneal dialysis site on left lower quadrant without surrounding erythema or warmth to touch.  Abdomen nondistended, hypoactive bowel sounds. Msk: Moves all extremities spontaneously  Ext: Warm, dry, 2+ distal pulses, no edema to bilateral lower extremity  Laboratory: Recent Labs  Lab 04/05/18 0943 04/05/18 2338 04/06/18 0444  WBC 6.7 6.9 7.8  HGB 9.0* 9.0* 8.6*  HCT 27.1* 28.3* 28.2*  PLT 209 258 243   Recent Labs  Lab 04/03/18 2055 04/04/18 0524 04/05/18 0943 04/06/18 0444  NA 137 137 134* 134*  K 3.7 4.1 3.3* 2.8*  CL 99 96* 94* 92*  CO2 18* 20* 22 22  BUN 67* 68* 51* 44*  CREATININE 16.88* 16.14* 14.03* 13.65*  CALCIUM 7.2* 6.8* 7.9* 8.5*  PROT 6.7  --   --   --   BILITOT 0.8  --   --   --   ALKPHOS 57  --   --   --   ALT 14  --   --   --   AST 17  --   --   --   GLUCOSE 120* 121* 123* 162*    Imaging/Diagnostic Tests:   [name], DO 04/06/2018, 7:48 AM PGY-1, [place] Intern pager: [phone], text pages  welcome

## 2018-04-07 DIAGNOSIS — D509 Iron deficiency anemia, unspecified: Secondary | ICD-10-CM

## 2018-04-07 DIAGNOSIS — K659 Peritonitis, unspecified: Secondary | ICD-10-CM

## 2018-04-07 DIAGNOSIS — R195 Other fecal abnormalities: Secondary | ICD-10-CM

## 2018-04-07 LAB — COMPREHENSIVE METABOLIC PANEL
ALT: 15 U/L (ref 0–44)
AST: 23 U/L (ref 15–41)
Albumin: 3 g/dL — ABNORMAL LOW (ref 3.5–5.0)
Alkaline Phosphatase: 50 U/L (ref 38–126)
Anion gap: 14 (ref 5–15)
BUN: 36 mg/dL — ABNORMAL HIGH (ref 6–20)
CO2: 24 mmol/L (ref 22–32)
Calcium: 8.5 mg/dL — ABNORMAL LOW (ref 8.9–10.3)
Chloride: 98 mmol/L (ref 98–111)
Creatinine, Ser: 14.27 mg/dL — ABNORMAL HIGH (ref 0.44–1.00)
GFR calc Af Amer: 3 mL/min — ABNORMAL LOW (ref >60)
GFR calc non Af Amer: 3 mL/min — ABNORMAL LOW (ref >60)
Glucose, Bld: 133 mg/dL — ABNORMAL HIGH (ref 70–99)
Potassium: 3.5 mmol/L (ref 3.5–5.1)
Sodium: 136 mmol/L (ref 135–145)
Total Bilirubin: 0.8 mg/dL (ref 0.3–1.2)
Total Protein: 6.7 g/dL (ref 6.5–8.1)

## 2018-04-07 LAB — CBC WITH DIFFERENTIAL/PLATELET
Abs Immature Granulocytes: 0.13 10*3/uL — ABNORMAL HIGH (ref 0.00–0.07)
Basophils Absolute: 0 10*3/uL (ref 0.0–0.1)
Basophils Relative: 0 %
Eosinophils Absolute: 0.2 10*3/uL (ref 0.0–0.5)
Eosinophils Relative: 3 %
HCT: 30 % — ABNORMAL LOW (ref 36.0–46.0)
Hemoglobin: 9.4 g/dL — ABNORMAL LOW (ref 12.0–15.0)
Immature Granulocytes: 2 %
Lymphocytes Relative: 20 %
Lymphs Abs: 1.6 10*3/uL (ref 0.7–4.0)
MCH: 27.4 pg (ref 26.0–34.0)
MCHC: 31.3 g/dL (ref 30.0–36.0)
MCV: 87.5 fL (ref 80.0–100.0)
Monocytes Absolute: 0.8 10*3/uL (ref 0.1–1.0)
Monocytes Relative: 9 %
Neutro Abs: 5.2 10*3/uL (ref 1.7–7.7)
Neutrophils Relative %: 66 %
Platelets: 263 10*3/uL (ref 150–400)
RBC: 3.43 MIL/uL — ABNORMAL LOW (ref 3.87–5.11)
RDW: 16.1 % — ABNORMAL HIGH (ref 11.5–15.5)
WBC: 8 10*3/uL (ref 4.0–10.5)
nRBC: 0.3 % — ABNORMAL HIGH (ref 0.0–0.2)

## 2018-04-07 MED ORDER — ACETAMINOPHEN 325 MG PO TABS: 650.0000 mg | ORAL_TABLET | Freq: Four times a day (QID) | ORAL | Status: DC | PRN

## 2018-04-07 MED ORDER — POLYETHYLENE GLYCOL 3350 17 G PO PACK: 17.0000 g | PACK | Freq: Every day | ORAL | 0 refills | Status: DC | PRN

## 2018-04-07 MED ORDER — POLYETHYLENE GLYCOL 3350 17 G PO PACK: 17.0000 g | PACK | Freq: Every day | ORAL | Status: DC | PRN

## 2018-04-07 MED ORDER — PROMETHAZINE HCL 25 MG PO TABS: 25.0000 mg | ORAL_TABLET | Freq: Three times a day (TID) | ORAL | 0 refills | Status: DC | PRN

## 2018-04-07 MED ORDER — SENNOSIDES-DOCUSATE SODIUM 8.6-50 MG PO TABS: 1.0000 | ORAL_TABLET | Freq: Every day | ORAL | 0 refills | Status: DC

## 2018-04-07 NOTE — TOC Transition Note (Signed)
Transition of Care [facility]) - CM/SW Discharge Note   Patient Details  Name: Patricia [name] MRN: [id number] Date of Birth: 12/10/1975  [facility] CM/SW Contact:  Bartholomew Crews, RN Phone Number:  [phone] 04/07/2018, 2:56 PM   Clinical Narrative:    Patient to transition home today. Patient was receiving PT with [facility]. MD notified for [facility] - PT order for resumption of services. [facility] notified of transition home. No other transition of care needs identified.   Final next level of care: [place] Barriers to Discharge: No Barriers Identified   Patient Goals and CMS Choice        Discharge Placement               Home        Discharge Plan and Services                DME Arranged: N/A DME Agency: NA HH Arranged: PT HH Agency: [facility]   Social Determinants of Health (SDOH) Interventions     Readmission Risk Interventions No flowsheet data found.

## 2018-04-07 NOTE — Progress Notes (Addendum)
[facility] Progress Note   Dialysis Orders: PD orders: CCPD 7 days/ week EDW ~118.5 kg  5 cycler exchanges 3L 2 hr dwell time last fill 3L 1 day exchange 3L 4 hr dwell time ( 6 exchanges in total)  Assessment/Plan: 1 Abd pain/ diarrhea/ dark stools: Had 6 watery stools prior to admisison.  None since here . STill having diffuse abdominal pain worse on the left. Hgb 8.6, down form 11.6 3/25.  Repeat capsule endoscopy vs balloon enteroscopy has been considered.  Per primary and GI. Velphoro also contributes to dark stools -  FOBT + again Abdominal CT unrevealing.  GI consulted -  2 ESRD: CCPD.  Recent PD fluid culture taken as an OP 3/26 was unrevealing--> Cell count 3/31 WBC was 3.  UF 1524 3/31  Net UF 2574  Usee all 2.5s last evening - net UF pending for today - standing wt last PM sig above EDW - labs today pending- reassess bath for tonight once we get  Net UF results and labs from today. 3 Hypertension/volume: MTP 25 at HS - Volume significantly up by weights- CXR yesterday NAD - some free air right abd likely related to PD status. 4. Anemia of ESRD/ acute blood loss anemia: recurrent dark stools and worsening anemia/ known small bowel AVMs .hgb 11.2 3/25.  Prior tsat 3/3 was 76% with ferritin 1639.   hgb 9  > 8.6 Stable 27% sat ferritin 1000 - labs today pending 5. Metabolic Bone Disease: Velphoro 2 tablets TID AC- contributing to dark stool, calcitriol 1 mcg daily Corrected [place]  iPTH 98 6.  Nutrition: renal vitamin and supps 7. Hypokalemia - K 2.8 liberalized diet to heart healthy ^ K intake repleting labs pending 8. Deconditioned - need mobilization  9.  [place], PA-C [facility] Beeper [phone] 04/07/2018,10:02 AM  LOS: 3 days   Pt seen, examined and agree w A/P as above.  [facility] 04/07/2018, 1:55 PM    Subjective:  Frustrated with lack of improvement - ate some frosted flakes this am. HAs been a  little more SOB.   Objective Vitals:   04/06/18 2014 04/06/18 2141 04/07/18 0336 04/07/18 0931  BP:  (!) 153/107 (!) 119/56 114/70  Pulse:  (!) 111 89 91  Resp:  $Remo'18 20 18  'SiwJk$ Temp:  98.9 F (37.2 C) 98.4 F (36.9 C) 98.3 F (36.8 C)  TempSrc:  Oral Oral Oral  SpO2: 94% 92% 94% 94%  Weight:      Height:       Physical Exam General: ill appearing morbidly obese female breathing easily at rest on room air Heart: RRR Lungs: no rales or wheezes Abdomen:  left sided signifcantly more tender than right abdominal pain today + BS Extremities: no LE edema Dialysis Access:  PD cath   Additional Objective Labs: Basic Metabolic Panel: Recent Labs  Lab 04/04/18 0524 04/05/18 0943 04/06/18 0444  NA 137 134* 134*  K 4.1 3.3* 2.8*  CL 96* 94* 92*  CO2 20* 22 22  GLUCOSE 121* 123* 162*  BUN 68* 51* 44*  CREATININE 16.14* 14.03* 13.65*  CALCIUM 6.8* 7.9* 8.5*  PHOS 8.7* 7.7*  --    Liver Function Tests: Recent Labs  Lab 04/03/18 2055 04/05/18 0943  AST 17  --   ALT 14  --   ALKPHOS 57  --   BILITOT 0.8  --   PROT 6.7  --   ALBUMIN 2.9*  3.0*   Recent Labs  Lab 04/03/18 2055  LIPASE 36   CBC: Recent Labs  Lab 04/03/18 2055 04/04/18 0524  04/05/18 0943 04/05/18 2338 04/06/18 0444  WBC 9.3 8.3  --  6.7 6.9 7.8  NEUTROABS 6.9  --   --   --   --   --   HGB 8.9* 8.8*   < > 9.0* 9.0* 8.6*  HCT 27.7* 27.5*   < > 27.1*  27.5* 28.3* 28.2*  MCV 88.8 88.1  --  87.1 87.3 87.9  PLT 227 240  --  209 258 243   < > = values in this interval not displayed.   Blood Culture    Component Value Date/Time   SDES PERITONEAL DIALYSATE 04/05/2018 1130   SDES PERITONEAL DIALYSATE 04/05/2018 1130   SPECREQUEST NONE 04/05/2018 1130   SPECREQUEST NONE 04/05/2018 1130   CULT  04/05/2018 1130    NO GROWTH < 24 HOURS Performed at [facility], [place] [address], [place], [place] [address]    REPTSTATUS PENDING 04/05/2018 1130   REPTSTATUS 04/05/2018 FINAL 04/05/2018 1130     Iron Studies:  Recent Labs    04/05/18 0943  IRON 57  TIBC 210*  FERRITIN 1,000*   Lab Results  Component Value Date   INR 1.07 02/26/2018   INR 0.98 12/22/2017   INR 1.04 04/14/2016   Studies/Results: Dg Chest 2 View  Result Date: 04/06/2018 CLINICAL DATA:  43 year old female with a history shortness of breath EXAM: CHEST - 2 VIEW COMPARISON:  CT 04/04/2018, plain film 12/18/2017 FINDINGS: Cardiomediastinal silhouette unchanged in size and contour. No evidence of central vascular congestion. No pneumothorax or pleural effusion. No confluent airspace disease. Similar appearance of asymmetric elevation the right hemidiaphragm. There is crescent of lucency under the right hemidiaphragm. No displaced fractures. IMPRESSION: Evidence of free air of the right abdomen, under the right hemidiaphragm. Correlation with CT imaging recommended and patient presentation, as hollow viscus perforation is not excluded. No acute cardiopulmonary disease Electronically Signed   By: Corrie Mckusick D.O.   On: 04/06/2018 12:08   Medications: . dialysis solution 1.5% low-MG/low-CA    . dialysis solution 2.5% low-MG/low-CA     . acetaminophen  650 mg Oral Q6H  . allopurinol  100 mg Oral QHS  . atorvastatin  40 mg Oral QHS  . calcitRIOL  1 mcg Oral QHS  . gentamicin cream  1 application Topical Daily  . heparin  5,000 Units Subcutaneous Q8H  . metoprolol tartrate  25 mg Oral QHS  . multivitamin  1 tablet Oral QHS  . ondansetron  4 mg Oral Once  . pantoprazole  80 mg Oral Daily  . polyethylene glycol  17 g Oral Daily  . senna-docusate  1 tablet Oral QHS  . sucroferric oxyhydroxide  1,000 mg Oral TID WC  . venlafaxine XR  75 mg Oral QHS

## 2018-04-07 NOTE — Progress Notes (Addendum)
Family Medicine Teaching Service Daily Progress Note Intern Pager: [phone]  [name] Medical record number: [id number] Date of birth: 1975/11/04 Age: 43 y.o. Gender: female  Primary Care Provider: Blair Heys, PA-C Consultants: Nephrology  Code Status: Full   Assessment and Plan: Patricia Danielsis a 43 y.o.femalepresenting with 3 days of abdominal pain. Past medical history is significant for ESRD onperitoneal dialysis-recent peritonitis, hypertension, paroxysmal atrial tachycardia, arteriovenous malformation of the small bowel.  Abdominal Pain: Stable: Pain is stable from yesterday, still nauseas. Reports one episode of non-bilious, non-bloody emesis overnight. Reports continued melena. Mild tenderness diffusely on exam, worse in left quadrant near peritoneal site with no overlying skin changes.  Peritoneal fluid with no growth <24 hrs with 3 WBC, while this was collected s/p short time of antibiotics, would have still expected some abnormality to be present if peritonitis. Potentially related to concerns as discussed below with concurrent melena, although is on iron supplementation which could cause dark stools.  Patient has had outpatient [name] work-up. - [name] consulted, appreciate recommendations - Follow-up peritoneal fluid and blood cultures - Phenergan p.o. as needed for nausea - F/U stool studies collected via [name] outpatient  - Oxycodone po 5 mg every 6 as needed for severe pain  Normocytic anemia, concern for acute [name] bleed with associated melena: Stable.  In setting of history of diagnosed AVMs.  Hemoglobin pending this a.m. 11.6 on admit, however patient's baseline appears to be between 8 and 9 on review of chart.  Patient reports having 3-4 dark stools yesterday.  Follows with Patricia [name], recently had a visit with Patricia [name] who was considering potential repeat capsule endoscopy versus device assisted enteroscopy. - [name] following, appreciate  recommendations - Monitor CBC daily, additional as needed - Transfuse <7  - Follow-up stool stool studies collected via [name] office on 3/25  SOB: Improving.  Patient with episode of acute S OB with associated wheezing.  However patient denies any S OB at this time and reports she is having some increased dyspnea on exertion which she has every time her blood count drops.  States that this is her normal baseline and it is not worse than it usually is.  Weight significantly up since admission (~16 lbs).  Considered PE, however on heparin and reassured she was comfortable on exam this morning, heart rate 80s. - CXR with no acute abnormality, free air seen likely related to PD - Albuterol PRN - Continue to monitor symptoms - Monitor daily CBC - Fluid restriction, 1.2 L  ESRD on PD: Daily dialysis. - Nephrology following, appreciate recommendations - Heart healthy diet, 1.2L fluid restriction -Continue calcitriol 0.5 mcg daily -Continue calcium and set 30 mg daily -ContinueVelphoro (sucroferric oxyhydroxide)500 mg 3 times daily with meals  Chronic Hypertension: Stable.  SBP 130-150.  - Continue home metoprolol 25 mg daily  Gout: Chronic, stable. No flares currently. -Continue allopurinol 100 mg daily  HLD: Stable. -Continue atorvastatin 40 mg daily  GERD: Stable -Continue pantoprazole 80 mg daily (substitute for omeprazole 40 mg twice daily)   Anxiety: Stable. -Continue venlafaxine 75 mg daily -Continue Xanax 1 mg twice daily as needed  Nutrition: renal diet DVT px: subQ heparin  Disposition: home  Subjective:  Patient currently receiving peritoneal dialysis morning.  States that she is still having some shortness of breath on exertion, none while at rest.  She reports that she is feeling very fatigued today and states that it is more than her baseline, however she has felt this way before  when her blood count has been low.  States that overall she thinks she is feeling  slightly better however she reports that she did have an episode of vomiting and has been feeling nauseous.  Patient reports that she had 3 dark stools overnight.   Objective: Temp:  [98.3 F (36.8 C)-98.9 F (37.2 C)] 98.3 F (36.8 C) (04/02 0931) Pulse Rate:  [89-111] 91 (04/02 0931) Resp:  [18-20] 18 (04/02 0931) BP: (114-153)/(56-107) 114/70 (04/02 0931) SpO2:  [92 %-99 %] 94 % (04/02 0931) Physical Exam: General: NAD, pleasant, lying flat in bed receiving PD Eyes: PERRL, EOMI, no conjunctival pallor or injection ENTM: Moist mucous membranes Neck: Supple, no LAD Cardiovascular: RRR, no m/r/g, no LE edema Respiratory: CTA BL, normal work of breathing Gastrointestinal: soft, diffusely tender, nondistended, normoactive BS MSK: moves 4 extremities equally Derm: no rashes appreciated Neuro: CN II-XII grossly intact Psych: AOx3, appropriate affect  Laboratory: Recent Labs  Lab 04/05/18 0943 04/05/18 2338 04/06/18 0444  WBC 6.7 6.9 7.8  HGB 9.0* 9.0* 8.6*  HCT 27.1*  27.5* 28.3* 28.2*  PLT 209 258 243   Recent Labs  Lab 04/03/18 2055 04/04/18 0524 04/05/18 0943 04/06/18 0444  NA 137 137 134* 134*  K 3.7 4.1 3.3* 2.8*  CL 99 96* 94* 92*  CO2 18* 20* 22 22  BUN 67* 68* 51* 44*  CREATININE 16.88* 16.14* 14.03* 13.65*  CALCIUM 7.2* 6.8* 7.9* 8.5*  PROT 6.7  --   --   --   BILITOT 0.8  --   --   --   ALKPHOS 57  --   --   --   ALT 14  --   --   --   AST 17  --   --   --   GLUCOSE 120* 121* 123* 162*    Imaging/Diagnostic Tests:   Akela [name], [place], DO 04/07/2018, 9:37 AM PGY-2, [place] Intern pager: [phone], text pages welcome

## 2018-04-07 NOTE — Consult Note (Addendum)
Referring Provider: Family Practice Primary Care Physician:  Blair Heys, PA-C Primary Gastroenterologist: Dr. Rush Landmark     Reason for Consultation:  Abdominal pain / anemia    ASSESSMENT / PLAN:     71. 43 year old female on peritoneal dialysis with chronic anemia. Admitted with ongoing left sided abdominal pain / melena.  She has had multiple endoscopic evaluation for anemia and melena. She has known small bowel AVMs.  Hgb 8.6 yesterday (today's not yet drawn). Hgb was 11.6 on outpatient labs 03/30/18 but ? spurious result as her baseline is 8-9.  -overall it doesn't appear hgb is really far off from baseline (yet). Today's labs are to be drawn after peritoneal dialysis.  -Depending on what hgb is doing will consider repeat VCE or SBE. CBC to be drawn 2 hours after PD.  2. Ongoing, diffuse left mid abdominal pain. Hx of peritonitis in February but no evidence for peritonitis this admission. Non-contrast CT scan this admission is unrevealing. WBC is normal. Etiology of pain not clear at this point.   3. Chronic, intermittent nausea and vomiting. There is a plan for outpatient gastric emptying study.   4. ESRD on PD   HPI:      HPI: Patricia [name] is a 43 y.o. female with PMH significant for ESRD on PD, history of peritonitis,  HTN, gout, anxiety, GERD, chronic anemia and small bowel AVMs. She has had numerous inpatient and outpatient endoscopic evaluations for anemia / melena. Had Telehealth visit with Dr. Rush Landmark 03/29/18, reported dark stools, abdominal pain. CBC done and hgb was 11.6. Plan was that if she did have evidence for GI bleed a repeat VCE or SBE would be repeated. We ordered stool studies, they weren't submitted.   Gearline came back to ED on 04/03/18 with ongoing diffuse left sided abdominal tenderness concerning for recurrent peritonitis. The black loose stool had resolved but then recurred a couple of days ago. Now stools are more formed but still black and are heme  +. Her abdominal pain is "nagging", gets sharp at times for unclear reasons. Pain not really related to meals or defecation. It is not positional. She says it is reminiscent of when she had peritonitis.   Recent Endoscopic evaluation:  EGD Feb 2020 -cm hiatal hernia, o/w unremarkable  Dec 24, 2017  Endocapsule Study- Incomplete study. Capsule remained in small bowel 13 hours , 51 min. Two non-bleeding AVMS between 2 hours 34 min and 3 hours 9 minutes.   December 2019 SBE - non-bleeding clean based gastric ulcer and erosive gastropathy, duodenopathy. VCE placed.   Oct 2019 Endocapsule study [facility]) showed nodularity in distal small bowel.   August 2019 colonoscopy - complete exam with adequate prep.  -congested and erythematous mucosa in the sigmoid colon, in the transverse colon and in the ascending colon. Mucosal friability noted in ascending colon and cecum, no active bleeding. -Diverticulosis in the sigmoid colon and in the descending colon. -The distal rectum and anal verge are normal on retroflexion view. -Two 3 to 8 mm polyps in the transverse colon, removed piecemeal using a cold biopsyforceps. Resected and retrieved.    Past Medical History:  Diagnosis Date   Acid reflux    Anxiety    Arthritis    Dyspnea    Dysrhythmia    tachycardia   ESRD (end stage renal disease) ([place])    [facility] [place]   GI bleeding 12/2017   Gout    Headache(784.0)    "related to chemo;  sometimes weekly" (09/12/2013)   High cholesterol    History of blood transfusion "a couple"   "related to low counts"   Hypertension    Iron deficiency anemia    "get epogen shots q month" (02/20/2014)   MCGN (minimal change glomerulonephritis)    "using chemo to tx;  finished my last tx in 12/2013"   Peritoneal dialysis status [facility] [place])     Past Surgical History:  Procedure Laterality Date   ABDOMINAL HYSTERECTOMY  2010   "laparoscopic"   ANKLE FRACTURE SURGERY Right 1994   AV FISTULA  PLACEMENT Left 04/14/2016   Procedure: ARTERIOVENOUS (AV) FISTULA CREATION;  Surgeon: Chuck Hint, MD;  Location[facility] OR;  Service: Vascular;  Laterality: Left;   AV FISTULA PLACEMENT Left 08/09/2017   Procedure: INSERTION OF ARTERIOVENOUS (AV) GORE-TEX GRAFT LEFT ARM;  Surgeon: Sherren Kerns, MD;  Location: [facility] OR;  Service: Vascular;  Laterality: Left;   AVGG REMOVAL Left 08/11/2017   Procedure: REMOVAL OF ARTERIOVENOUS GORETEX GRAFT (AVGG);  Surgeon: Nada Libman, MD;  Location: [facility] OR;  Service: Vascular;  Laterality: Left;   BIOPSY  09/03/2017   Procedure: BIOPSY;  Surgeon: Kathi Der, MD;  Location: [facility] ENDOSCOPY;  Service: Gastroenterology;;   BIOPSY  12/24/2017   Procedure: BIOPSY;  Surgeon: Lemar Lofty., MD;  Location: [facility] ENDOSCOPY;  Service: Gastroenterology;;   CARDIAC CATHETERIZATION  2000's   COLONOSCOPY WITH PROPOFOL N/A 09/04/2017   Procedure: COLONOSCOPY WITH PROPOFOL;  Surgeon: Kerin Salen, MD;  Location: [facility] ENDOSCOPY;  Service: Gastroenterology;  Laterality: N/A;   ENTEROSCOPY N/A 12/24/2017   Procedure: ENTEROSCOPY;  Surgeon: Meridee Score Netty Starring., MD;  Location: [facility] ENDOSCOPY;  Service: Gastroenterology;  Laterality: N/A;   ESOPHAGOGASTRODUODENOSCOPY     ESOPHAGOGASTRODUODENOSCOPY (EGD) WITH PROPOFOL Left 06/04/2017   Procedure: ESOPHAGOGASTRODUODENOSCOPY (EGD) WITH PROPOFOL;  Surgeon: Willis Modena, MD;  Location: [facility] ENDOSCOPY;  Service: Endoscopy;  Laterality: Left;   ESOPHAGOGASTRODUODENOSCOPY (EGD) WITH PROPOFOL N/A 09/03/2017   Procedure: ESOPHAGOGASTRODUODENOSCOPY (EGD) WITH PROPOFOL;  Surgeon: Kathi Der, MD;  Location: [facility] ENDOSCOPY;  Service: Gastroenterology;  Laterality: N/A;   FISTULA SUPERFICIALIZATION Left 04/02/2017   Procedure: FISTULA SUPERFICIALIZATION LEFT ARM;  Surgeon: Nada Libman, MD;  Location: [facility] OR;  Service: Vascular;  Laterality: Left;   FISTULOGRAM Left 04/07/2016   Procedure: FISTULOGRAM;  Surgeon:  Maeola Harman, MD;  Location: [facility] OR;  Service: Vascular;  Laterality: Left;   FRACTURE SURGERY     right ankle   GIVENS CAPSULE STUDY N/A 10/29/2017   Procedure: GIVENS CAPSULE STUDY;  Surgeon: Charlott Rakes, MD;  Location: [facility] ENDOSCOPY;  Service: Endoscopy;  Laterality: N/A;   GIVENS CAPSULE STUDY N/A 12/24/2017   Procedure: GIVENS CAPSULE STUDY;  Surgeon: Lemar Lofty., MD;  Location[facility] ENDOSCOPY;  Service: Gastroenterology;  Laterality: N/A;   INSERTION OF DIALYSIS CATHETER Right 04/07/2016   Procedure: INSERTION OF DIALYSIS CATHETER;  Surgeon: Maeola Harman, MD;  Location: [facility] OR;  Service: Vascular;  Laterality: Right;   INSERTION OF DIALYSIS CATHETER Right 04/04/2017   Procedure: INSERTION OF DIALYSIS CATHETER;  Surgeon: Maeola Harman, MD;  Location: [facility] OR;  Service: Vascular;  Laterality: Right;   LIGATION OF ARTERIOVENOUS  FISTULA Left 04/14/2016   Procedure: LIGATION OF ARTERIOVENOUS  FISTULA;  Surgeon: Chuck Hint, MD;  Location: [facility] OR;  Service: Vascular;  Laterality: Left;   PATCH ANGIOPLASTY Left 06/19/2016   Procedure: PATCH ANGIOPLASTY LEFT ARTERIOVENOUS FISTULA;  Surgeon: Chuck Hint, MD;  Location: [facility] OR;  Service: Vascular;  Laterality: Left;   POLYPECTOMY  09/04/2017   Procedure: POLYPECTOMY;  Surgeon: Ronnette Juniper, MD;  Location: [facility] ENDOSCOPY;  Service: Gastroenterology;;   REVISON OF ARTERIOVENOUS FISTULA Left 06/19/2016   Procedure: REVISION SUPERFICIALIZATION OF BRACHIOCEPHALIC ARTERIOVENOUS FISTULA;  Surgeon: Angelia Mould, MD;  Location: [place];  Service: Vascular;  Laterality: Left;    Prior to Admission medications   Medication Sig Start Date End Date Taking? Authorizing Provider  allopurinol (ZYLOPRIM) 100 MG tablet Take 100 mg by mouth at bedtime.    Yes [provider]  ALPRAZolam Duanne Moron) 1 MG tablet Take 1 mg by mouth 2 (two) times daily as needed for anxiety.  03/19/17  Yes  [provider]  atorvastatin (LIPITOR) 40 MG tablet Take 40 mg by mouth at bedtime.  03/14/14  Yes [provider]  calcitRIOL (ROCALTROL) 0.5 MCG capsule Take 1 capsule (0.5 mcg total) by mouth daily with breakfast. Patient taking differently: Take 0.5 mcg by mouth at bedtime.  06/04/17  Yes Bonnielee Haff, MD  cinacalcet (SENSIPAR) 30 MG tablet Take 1 tablet (30 mg total) by mouth daily. Patient taking differently: Take 30 mg by mouth daily with supper.  08/19/17  Yes Shelly Coss, MD  diclofenac sodium (VOLTAREN) 1 % GEL Apply 1 application topically 4 (four) times daily as needed for pain. 03/20/18  Yes [provider]  diphenhydramine-acetaminophen (TYLENOL PM) 25-500 MG TABS tablet Take 2 tablets by mouth at bedtime as needed (sleep).    Yes [provider]  gentamicin ointment (GARAMYCIN) 0.1 % Apply 1 application topically daily as needed (changing dressing.).   Yes [provider]  hydrOXYzine (ATARAX/VISTARIL) 25 MG tablet Take 25 mg by mouth every 6 (six) hours as needed for itching.  01/25/18  Yes [provider]  lisinopril (PRINIVIL,ZESTRIL) 20 MG tablet Take 20 mg by mouth daily as needed (elevated blood pressure greater than 140/90).  03/11/18  Yes [provider]  methocarbamol (ROBAXIN) 500 MG tablet Take 500 mg by mouth 2 (two) times daily as needed for muscle spasms.   Yes [provider]  metoprolol tartrate (LOPRESSOR) 25 MG tablet Take 25 mg by mouth at bedtime.  04/17/16  Yes [provider]  multivitamin (RENA-VIT) TABS tablet Take 1 tablet by mouth at bedtime.    Yes [provider]  omeprazole (PRILOSEC) 40 MG capsule Take 40 mg by mouth 2 (two) times daily.   Yes [provider]  prochlorperazine (COMPAZINE) 10 MG tablet Take 1 tablet (10 mg total) by mouth every 8 (eight) hours as needed for nausea or vomiting. 02/17/18  Yes Mansouraty, Telford Nab., MD  promethazine (PHENERGAN)  25 MG tablet Take 25 mg by mouth every 8 (eight) hours as needed for nausea or vomiting.   Yes [provider]  senna (SENOKOT) 8.6 MG TABS tablet Take 2 tablets (17.2 mg total) by mouth at bedtime. Patient taking differently: Take 2 tablets by mouth at bedtime as needed for mild constipation.  04/15/16  Yes Nita Sells, MD  sucroferric oxyhydroxide (VELPHORO) 500 MG chewable tablet Chew 1,000 mg by mouth 3 (three) times daily before meals.    Yes [provider]  venlafaxine XR (EFFEXOR-XR) 75 MG 24 hr capsule Take 75 mg by mouth at bedtime.    Yes [provider]  prochlorperazine (COMPAZINE) 25 MG suppository Place 25 mg rectally 2 (two) times daily as needed for nausea/vomiting. 02/18/18   [provider]    Current Facility-Administered Medications  Medication Dose Route  Frequency Provider Last Rate Last Dose   acetaminophen (TYLENOL) tablet 650 mg  650 mg Oral Q6H Rumball, Alison, DO   Stopped at 04/05/18 0012   albuterol (PROVENTIL) (2.5 MG/3ML) 0.083% nebulizer solution 2.5 mg  2.5 mg Nebulization Q4H PRN Martyn Malay, MD       allopurinol (ZYLOPRIM) tablet 100 mg  100 mg Oral QHS Matilde Haymaker, MD   100 mg at 04/06/18 2142   ALPRAZolam Duanne Moron) tablet 1 mg  1 mg Oral BID PRN Matilde Haymaker, MD   1 mg at 04/07/18 0134   atorvastatin (LIPITOR) tablet 40 mg  40 mg Oral QHS Matilde Haymaker, MD   40 mg at 04/06/18 2143   calcitRIOL (ROCALTROL) capsule 1 mcg  1 mcg Oral QHS Madelon Lips, MD   1 mcg at 04/06/18 2142   dialysis solution 1.5% low-MG/low-CA dianeal solution   Intraperitoneal Q24H Erenest Blank, [facility]       dialysis solution 2.5% low-MG/low-CA dianeal solution   Intraperitoneal Q24H Erenest Blank, RPH       gentamicin cream (GARAMYCIN) 0.1 % 1 application  1 application Topical Daily Matilde Haymaker, MD   1 application at 56/31/49 1021   heparin 2,500 Units in dialysis solution 1.5% low-MG/low-CA 5,000 mL dialysis solution    Peritoneal Dialysis PRN Erenest Blank, RPH       heparin 2,500 Units in dialysis solution 2.5% low-MG/low-CA 5,000 mL dialysis solution   Peritoneal Dialysis PRN Erenest Blank, RPH       heparin injection 5,000 Units  5,000 Units Subcutaneous Q8H Matilde Haymaker, MD   5,000 Units at 04/06/18 2143   hydrOXYzine (ATARAX/VISTARIL) tablet 25 mg  25 mg Oral Q6H PRN Matilde Haymaker, MD   25 mg at 04/06/18 2143   metoprolol tartrate (LOPRESSOR) tablet 25 mg  25 mg Oral QHS Matilde Haymaker, MD   25 mg at 04/06/18 2143   multivitamin (RENA-VIT) tablet 1 tablet  1 tablet Oral QHS Matilde Haymaker, MD   1 tablet at 04/06/18 2143   ondansetron (ZOFRAN) injection 4 mg  4 mg Intravenous Q8H PRN Darrelyn Hillock N, DO   4 mg at 04/06/18 1018   ondansetron (ZOFRAN-ODT) disintegrating tablet 4 mg  4 mg Oral Once Matilde Haymaker, MD       oxyCODONE (Oxy IR/ROXICODONE) immediate release tablet 5 mg  5 mg Oral Q6H PRN Rory Percy, DO   5 mg at 04/06/18 2143   pantoprazole (PROTONIX) EC tablet 80 mg  80 mg Oral Daily Matilde Haymaker, MD   80 mg at 04/06/18 1107   polyethylene glycol (MIRALAX / GLYCOLAX) packet 17 g  17 g Oral Daily Rory Percy, DO       promethazine (PHENERGAN) tablet 25 mg  25 mg Oral Q8H PRN Matilde Haymaker, MD   25 mg at 04/07/18 0135   senna-docusate (Senokot-S) tablet 1 tablet  1 tablet Oral QHS Rory Percy, DO   1 tablet at 04/06/18 2143   sucroferric oxyhydroxide (VELPHORO) chewable tablet 1,000 mg  1,000 mg Oral TID WC Matilde Haymaker, MD   1,000 mg at 04/06/18 1824   venlafaxine XR (EFFEXOR-XR) 24 hr capsule 75 mg  75 mg Oral QHS Matilde Haymaker, MD   75 mg at 04/06/18 2143    Allergies as of 04/03/2018 - Review Complete 04/03/2018  Allergen Reaction Noted   Nsaids Other (See Comments) 01/27/2013   Tolmetin Other (See Comments) 07/20/2015    Family History  Problem Relation Age of Onset  Hypertension Mother    Thyroid disease Mother    Coronary artery disease Father     Hypertension Father    Diabetes Father    Colon cancer Maternal Aunt    Esophageal cancer Maternal Uncle    Inflammatory bowel disease Neg Hx    Liver disease Neg Hx    Pancreatic cancer Neg Hx    Rectal cancer Neg Hx    Stomach cancer Neg Hx     Social History   Socioeconomic History   Marital status: Single    Spouse name: Not on file   Number of children: 3   Years of education: 14   Highest education level: Associate degree: academic program  Occupational History   Occupation: disabled  Scientist, product/process development strain: Not hard at all   Food insecurity:    Worry: Sometimes true    Inability: Never true   Transportation needs:    Medical: No    Non-medical: No  Tobacco Use   Smoking status: Never Smoker   Smokeless tobacco: Never Used  Substance and Sexual Activity   Alcohol use: No   Drug use: No   Sexual activity: Not Currently    Birth control/protection: Surgical  Lifestyle   Physical activity:    Days per week: 0 days    Minutes per session: 0 min   Stress: Only a little  Relationships   Social connections:    Talks on phone: More than three times a week    Gets together: More than three times a week    Attends religious service: More than 4 times per year    Active member of club or organization: No    Attends meetings of clubs or organizations: Never    Relationship status: Never married   Intimate partner violence:    Fear of current or ex partner: No    Emotionally abused: No    Physically abused: No    Forced sexual activity: No  Other Topics Concern   Not on file  Social History Narrative   Not on file    Review of Systems: All systems reviewed and negative except where noted in HPI.  Physical Exam: Vital signs in last 24 hours: Temp:  [98.4 F (36.9 C)-98.9 F (37.2 C)] 98.4 F (36.9 C) (04/02 0336) Pulse Rate:  [89-111] 89 (04/02 0336) Resp:  [18-20] 20 (04/02 0336) BP: (119-153)/(56-107)  119/56 (04/02 0336) SpO2:  [92 %-99 %] 94 % (04/02 0336) Last BM Date: 04/06/18 General:   Alert,female in NAD. Getting PD right now Psych:  Pleasant, cooperative. Normal mood and affect. Eyes:  Pupils equal, sclera clear, no icterus.   Conjunctiva pink. Ears:  Normal auditory acuity. Nose:  No deformity, discharge,  or lesions. Neck:  Supple; no masses Lungs:  Clear throughout to auscultation.   No wheezes, crackles, or rhonchi.  Heart:  Regular rate and rhythm; no murmurs, no lower extremity edema Abdomen:  Soft, non-distended, moderate left mid abdominal tenderness. BS active, no palp mass    Rectal:  Deferred  Msk:  Symmetrical without gross deformities. . Neurologic:  Alert and  oriented x4;  grossly normal neurologically. Skin:  Intact without significant lesions or rashes.   Intake/Output from previous day: 04/01 0701 - 04/02 0700 In: 18739 [P.O.:760] Out: -  Intake/Output this shift: No intake/output data recorded.  Lab Results: Recent Labs    04/05/18 0943 04/05/18 2338 04/06/18 0444  WBC 6.7 6.9 7.8  HGB 9.0* 9.0* 8.6*  HCT 27.1*   27.5* 28.3* 28.2*  PLT 209 258 243   BMET Recent Labs    04/05/18 0943 04/06/18 0444  NA 134* 134*  K 3.3* 2.8*  CL 94* 92*  CO2 22 22  GLUCOSE 123* 162*  BUN 51* 44*  CREATININE 14.03* 13.65*  CALCIUM 7.9* 8.5*   LFT Recent Labs    04/05/18 0943  ALBUMIN 3.0*   Studies/Results: Dg Chest 2 View  Result Date: 04/06/2018 CLINICAL DATA:  43 year old female with a history shortness of breath EXAM: CHEST - 2 VIEW COMPARISON:  CT 04/04/2018, plain film 12/18/2017 FINDINGS: Cardiomediastinal silhouette unchanged in size and contour. No evidence of central vascular congestion. No pneumothorax or pleural effusion. No confluent airspace disease. Similar appearance of asymmetric elevation the right hemidiaphragm. There is crescent of lucency under the right hemidiaphragm. No displaced fractures. IMPRESSION: Evidence of free air of  the right abdomen, under the right hemidiaphragm. Correlation with CT imaging recommended and patient presentation, as hollow viscus perforation is not excluded. No acute cardiopulmonary disease Electronically Signed   By: Corrie Mckusick D.O.   On: 04/06/2018 12:08     Tye Savoy, NP-C @  04/07/2018, 8:56 AM     ________________________________________________________________________  [facility] GI MD note:  I personally examined the patient, reviewed the data and agree with the assessment and plan described above.  She has chronic mutlifactorial anemia, heme +.  Very possibly has intermittent oozing from small bowel AVMs.  Her current Hb is actually not very far off her usual.  Dark stools currently could be from iron supplement she takes daily.  I am not planning any invasive procedures for her currently.  Recommend she stay on velphoro TID as she's been doing (contains iron) and blood transfusion PRN.  Her abdominal pain is chronic, not obviously explained despite CT scans (12/2017, 02/2018, 03/2018),colonoscopy 08/2017, several upper endoscopic procedures (08/2017, 12/2017,02/2018).  I cannot think of any other tests to workup her abdominal pains at this point.  I presumed the pain is either functional, related to previous peritonitis (perhaps the infection is gone but damage, inflammation is still present) or adhesive.  I recommend empiric treatment of the pain, obviously try to avoid narcotic pain meds as best as possible.  Please call or page with any further questions or concerns.    Owens Loffler, MD [facility] Pager [phone]

## 2018-04-08 LAB — CULTURE, BLOOD (ROUTINE X 2)
Culture: NO GROWTH
Culture: NO GROWTH
Special Requests: ADEQUATE

## 2018-04-10 LAB — CULTURE, BODY FLUID-BOTTLE

## 2018-04-10 LAB — CULTURE, BODY FLUID W GRAM STAIN -BOTTLE: Culture: NO GROWTH

## 2018-04-29 ENCOUNTER — Emergency Department (HOSPITAL_COMMUNITY)
Admission: EM | Admit: 2018-04-29 | Discharge: 2018-04-29 | Disposition: A | Discharge status: Home / Self Care | Payer: Medicare Other | Attending: Emergency Medicine | Admitting: Emergency Medicine

## 2018-04-29 ENCOUNTER — Emergency Department (HOSPITAL_COMMUNITY): Payer: Medicare Other

## 2018-04-29 DIAGNOSIS — D649 Anemia, unspecified: Secondary | ICD-10-CM | POA: Diagnosis not present

## 2018-04-29 DIAGNOSIS — E1122 Type 2 diabetes mellitus with diabetic chronic kidney disease: Secondary | ICD-10-CM | POA: Insufficient documentation

## 2018-04-29 DIAGNOSIS — Z79899 Other long term (current) drug therapy: Secondary | ICD-10-CM | POA: Insufficient documentation

## 2018-04-29 DIAGNOSIS — Z992 Dependence on renal dialysis: Secondary | ICD-10-CM | POA: Diagnosis not present

## 2018-04-29 DIAGNOSIS — R531 Weakness: Secondary | ICD-10-CM | POA: Insufficient documentation

## 2018-04-29 DIAGNOSIS — N186 End stage renal disease: Secondary | ICD-10-CM | POA: Diagnosis not present

## 2018-04-29 DIAGNOSIS — I120 Hypertensive chronic kidney disease with stage 5 chronic kidney disease or end stage renal disease: Secondary | ICD-10-CM | POA: Insufficient documentation

## 2018-04-29 DIAGNOSIS — R42 Dizziness and giddiness: Secondary | ICD-10-CM | POA: Diagnosis present

## 2018-04-29 LAB — COMPREHENSIVE METABOLIC PANEL
ALT: 22 U/L (ref 0–44)
AST: 26 U/L (ref 15–41)
Albumin: 3.2 g/dL — ABNORMAL LOW (ref 3.5–5.0)
Alkaline Phosphatase: 64 U/L (ref 38–126)
Anion gap: 19 — ABNORMAL HIGH (ref 5–15)
BUN: 53 mg/dL — ABNORMAL HIGH (ref 6–20)
CO2: 23 mmol/L (ref 22–32)
Calcium: 7.8 mg/dL — ABNORMAL LOW (ref 8.9–10.3)
Chloride: 94 mmol/L — ABNORMAL LOW (ref 98–111)
Creatinine, Ser: 15.58 mg/dL — ABNORMAL HIGH (ref 0.44–1.00)
GFR calc Af Amer: 3 mL/min — ABNORMAL LOW (ref >60)
GFR calc non Af Amer: 2 mL/min — ABNORMAL LOW (ref >60)
Glucose, Bld: 92 mg/dL (ref 70–99)
Potassium: 3.8 mmol/L (ref 3.5–5.1)
Sodium: 136 mmol/L (ref 135–145)
Total Bilirubin: 0.8 mg/dL (ref 0.3–1.2)
Total Protein: 6.7 g/dL (ref 6.5–8.1)

## 2018-04-29 LAB — CBC WITH DIFFERENTIAL/PLATELET
Abs Immature Granulocytes: 0.09 10*3/uL — ABNORMAL HIGH (ref 0.00–0.07)
Basophils Absolute: 0.1 10*3/uL (ref 0.0–0.1)
Basophils Relative: 1 %
Eosinophils Absolute: 0.2 10*3/uL (ref 0.0–0.5)
Eosinophils Relative: 2 %
HCT: 28.4 % — ABNORMAL LOW (ref 36.0–46.0)
Hemoglobin: 8.9 g/dL — ABNORMAL LOW (ref 12.0–15.0)
Immature Granulocytes: 1 %
Lymphocytes Relative: 23 %
Lymphs Abs: 2.3 10*3/uL (ref 0.7–4.0)
MCH: 27.8 pg (ref 26.0–34.0)
MCHC: 31.3 g/dL (ref 30.0–36.0)
MCV: 88.8 fL (ref 80.0–100.0)
Monocytes Absolute: 0.7 10*3/uL (ref 0.1–1.0)
Monocytes Relative: 7 %
Neutro Abs: 6.9 10*3/uL (ref 1.7–7.7)
Neutrophils Relative %: 66 %
Platelets: 290 10*3/uL (ref 150–400)
RBC: 3.2 MIL/uL — ABNORMAL LOW (ref 3.87–5.11)
RDW: 16.1 % — ABNORMAL HIGH (ref 11.5–15.5)
WBC: 10.2 10*3/uL (ref 4.0–10.5)
nRBC: 1.4 % — ABNORMAL HIGH (ref 0.0–0.2)

## 2018-04-29 LAB — TYPE AND SCREEN
ABO/RH(D): O NEG
Antibody Screen: NEGATIVE

## 2018-04-29 NOTE — ED Provider Notes (Signed)
[facility] EMERGENCY DEPARTMENT Provider Note   CSN: [id number] Arrival date & time: 04/29/18  1909    History   Chief Complaint Chief Complaint  Patient presents with  . Fatigue  . Dizziness    HPI Jordynne Marshman is a 43 y.o. female.     The history is provided by the patient. No language interpreter was used.  Dizziness  Quality:  Lightheadedness Severity:  Mild Onset quality:  Gradual Duration:  5 days Timing:  Constant Progression:  Worsening Chronicity:  New Relieved by:  Nothing Worsened by:  Nothing Ineffective treatments:  None tried Associated symptoms: weakness   Associated symptoms: no nausea   Risk factors: anemia   Pt is concerned that she may need a blood transfusion.  Pt reports she has a history of anemia second to renal failure.  Pt reports she feels weak like she has in the past.   Past Medical History:  Diagnosis Date  . Acid reflux   . Anxiety   . Arthritis   . Dyspnea   . Dysrhythmia    tachycardia  . ESRD (end stage renal disease) ([place])    [facility]- [place]  . GI bleeding 12/2017  . Gout   . Headache(784.0)    "related to chemo; sometimes weekly" (09/12/2013)  . High cholesterol   . History of blood transfusion "a couple"   "related to low counts"  . Hypertension   . Iron deficiency anemia    "get epogen shots q month" (02/20/2014)  . 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 Peritoneal dialysis status [facility])     Patient Active Problem List   Diagnosis Date Noted  . Peritonitis ([place]) 04/03/2018  . History of peritonitis 03/29/2018  . Peritonitis associated with peritoneal dialysis ([name]) 02/22/2018  . Early satiety 02/18/2018  . AVM (arteriovenous malformation) of small bowel, acquired 02/18/2018  . Acute gastric ulcer without hemorrhage or perforation 02/18/2018  . Dark stools 02/18/2018  . Acute GI bleeding 12/23/2017  . Symptomatic anemia  10/28/2017  . Esophageal candidiasis ([place]) 09/01/2017  . Anxiety 09/01/2017  . Stroke ([place]) 08/15/2017  . Dysphagia 08/15/2017  . Melena 06/03/2017  . Abdominal pain 06/03/2017  . Dialysis AV fistula malfunction ([place]) 04/04/2017  . ESRD on peritoneal dialysis ([place]) 04/01/2016  . Anemia due to chronic kidney disease 04/01/2016  . GERD (gastroesophageal reflux disease) 04/01/2016  . Hypomagnesemia 04/28/2015  . Acute renal failure superimposed on stage 3 chronic kidney disease ([place]) 04/12/2014  . Atypical chest pain   . Chest pain 02/19/2014  . Hypokalemia 02/19/2014  . Nephrotic syndrome 10/02/2013  . Dyslipidemia 10/02/2013  . PAT (paroxysmal atrial tachycardia) ([place]) 09/29/2013  . Normocytic anemia 06/29/2013  . Edema 06/29/2013  . Nausea without vomiting 06/29/2013  . Fever, unspecified 01/31/2013  . Glomerulonephritis 01/28/2013  . Syncope 01/28/2013  . Shortness of breath 07/04/2011  . Morbid obesity ([place]) 07/04/2011  . HTN (hypertension) 07/04/2011  . Hypercholesterolemia 07/04/2011    Past Surgical History:  Procedure Laterality Date  . ABDOMINAL HYSTERECTOMY  2010   "laparoscopic"  . ANKLE FRACTURE SURGERY Right 1994  . AV FISTULA PLACEMENT Left 04/14/2016   Procedure: ARTERIOVENOUS (AV) FISTULA CREATION;  Surgeon: Angelia Mould, MD;  Location[facility] OR;  Service: Vascular;  Laterality: Left;  . AV FISTULA PLACEMENT Left 08/09/2017   Procedure: INSERTION OF ARTERIOVENOUS (AV) GORE-TEX GRAFT LEFT ARM;  Surgeon: Elam Dutch, MD;  Location: [facility] OR;  Service: Vascular;  Laterality: Left;  . [place] REMOVAL Left 08/11/2017   Procedure: REMOVAL OF ARTERIOVENOUS GORETEX GRAFT ([place]);  Surgeon: Serafina Mitchell, MD;  Location: [facility] OR;  Service: Vascular;  Laterality: Left;  . BIOPSY  09/03/2017   Procedure: BIOPSY;  Surgeon: Otis Brace, MD;  Location: [place];  Service: Gastroenterology;;  . BIOPSY  12/24/2017   Procedure: BIOPSY;  Surgeon: Irving Copas., MD;  Location: [place];  Service: Gastroenterology;;  . CARDIAC CATHETERIZATION  2000's  . COLONOSCOPY WITH PROPOFOL N/A 09/04/2017   Procedure: COLONOSCOPY WITH PROPOFOL;  Surgeon: Ronnette Juniper, MD;  Location: [place];  Service: Gastroenterology;  Laterality: N/A;  . ENTEROSCOPY N/A 12/24/2017   Procedure: ENTEROSCOPY;  Surgeon: Rush Landmark Telford Nab., MD;  Location: [place];  Service: Gastroenterology;  Laterality: N/A;  . ESOPHAGOGASTRODUODENOSCOPY    . ESOPHAGOGASTRODUODENOSCOPY (EGD) WITH PROPOFOL Left 06/04/2017   Procedure: ESOPHAGOGASTRODUODENOSCOPY (EGD) WITH PROPOFOL;  Surgeon: Arta Silence, MD;  Location: [place];  Service: Endoscopy;  Laterality: Left;  . ESOPHAGOGASTRODUODENOSCOPY (EGD) WITH PROPOFOL N/A 09/03/2017   Procedure: ESOPHAGOGASTRODUODENOSCOPY (EGD) WITH PROPOFOL;  Surgeon: Otis Brace, MD;  Location: [facility];  Service: Gastroenterology;  Laterality: N/A;  . FISTULA SUPERFICIALIZATION Left 04/02/2017   Procedure: FISTULA SUPERFICIALIZATION LEFT ARM;  Surgeon: Serafina Mitchell, MD;  Location: [place];  Service: Vascular;  Laterality: Left;  . FISTULOGRAM Left 04/07/2016   Procedure: FISTULOGRAM;  Surgeon: Waynetta [place], MD;  Location: [place];  Service: Vascular;  Laterality: Left;  . FRACTURE SURGERY     right ankle  . GIVENS CAPSULE STUDY N/A 10/29/2017   Procedure: GIVENS CAPSULE STUDY;  Surgeon: Wilford Corner, MD;  Location: [place];  Service: Endoscopy;  Laterality: N/A;  . GIVENS CAPSULE STUDY N/A 12/24/2017   Procedure: GIVENS CAPSULE STUDY;  Surgeon: Irving Copas., MD;  Location: [place];  Service: Gastroenterology;  Laterality: N/A;  . INSERTION OF DIALYSIS CATHETER Right 04/07/2016   Procedure: INSERTION OF DIALYSIS CATHETER;  Surgeon: Waynetta [place], MD;  Location: [place];  Service: Vascular;  Laterality: Right;  . INSERTION OF DIALYSIS CATHETER Right 04/04/2017   Procedure: INSERTION OF  DIALYSIS CATHETER;  Surgeon: Waynetta [place], MD;  Location: [facility];  Service: Vascular;  Laterality: Right;  . LIGATION OF ARTERIOVENOUS  FISTULA Left 04/14/2016   Procedure: LIGATION OF ARTERIOVENOUS  FISTULA;  Surgeon: Angelia Mould, MD;  Location: [place];  Service: Vascular;  Laterality: Left;  . PATCH ANGIOPLASTY Left 06/19/2016   Procedure: PATCH ANGIOPLASTY LEFT ARTERIOVENOUS FISTULA;  Surgeon: Angelia Mould, MD;  Location: [facility];  Service: Vascular;  Laterality: Left;  . POLYPECTOMY  09/04/2017   Procedure: POLYPECTOMY;  Surgeon: Ronnette Juniper, MD;  Location: [facility];  Service: Gastroenterology;;  . REVISON OF ARTERIOVENOUS FISTULA Left 06/19/2016   Procedure: REVISION SUPERFICIALIZATION OF BRACHIOCEPHALIC ARTERIOVENOUS FISTULA;  Surgeon: Angelia Mould, MD;  Location[facility] OR;  Service: Vascular;  Laterality: Left;     OB History    Gravida  3   Para      Term      Preterm      AB      Living        SAB      TAB      Ectopic      Multiple  3   Live Births               Home Medications    Prior  to Admission medications   Medication Sig Start Date End Date Taking? Authorizing Provider  allopurinol (ZYLOPRIM) 100 MG tablet Take 100 mg by mouth at bedtime.    Yes [provider]  ALPRAZolam Prudy Feeler) 1 MG tablet Take 1 mg by mouth 2 (two) times daily as needed for anxiety.  03/19/17  Yes [provider]  atorvastatin (LIPITOR) 40 MG tablet Take 40 mg by mouth at bedtime.  03/14/14  Yes [provider]  calcitRIOL (ROCALTROL) 0.5 MCG capsule Take 1 capsule (0.5 mcg total) by mouth daily with breakfast. Patient taking differently: Take 0.5 mcg by mouth at bedtime.  06/04/17  Yes Osvaldo Shipper, MD  diclofenac sodium (VOLTAREN) 1 % GEL Apply 1 application topically 4 (four) times daily as needed for pain. 03/20/18  Yes [provider]  gentamicin ointment (GARAMYCIN) 0.1 % Apply 1 application topically  daily as needed (changing dressing.).   Yes [provider]  HYDROcodone-acetaminophen (NORCO/VICODIN) 5-325 MG tablet Take 1 tablet by mouth every 4 (four) hours as needed for pain. 03/30/18  Yes [provider]  hydrOXYzine (ATARAX/VISTARIL) 25 MG tablet Take 25 mg by mouth every 6 (six) hours as needed for itching.  01/25/18  Yes [provider]  metoprolol tartrate (LOPRESSOR) 25 MG tablet Take 25 mg by mouth at bedtime.  04/17/16  Yes [provider]  multivitamin (RENA-VIT) TABS tablet Take 1 tablet by mouth at bedtime.    Yes [provider]  omeprazole (PRILOSEC) 40 MG capsule Take 40 mg by mouth 2 (two) times daily.   Yes [provider]  prochlorperazine (COMPAZINE) 10 MG tablet Take 1 tablet (10 mg total) by mouth every 8 (eight) hours as needed for nausea or vomiting. 02/17/18  Yes Mansouraty, Netty Starring., MD  promethazine (PHENERGAN) 25 MG tablet Take 1 tablet (25 mg total) by mouth every 8 (eight) hours as needed for nausea or vomiting. 04/07/18  Yes Talbert Forest, [place], DO  senna (SENOKOT) 8.6 MG TABS tablet Take 2 tablets (17.2 mg total) by mouth at bedtime. Patient taking differently: Take 2 tablets by mouth at bedtime as needed for mild constipation.  04/15/16  Yes Rhetta Mura, MD  sucroferric oxyhydroxide (VELPHORO) 500 MG chewable tablet Chew 1,000 mg by mouth 3 (three) times daily before meals.    Yes [provider]  venlafaxine XR (EFFEXOR-XR) 75 MG 24 hr capsule Take 75 mg by mouth at bedtime.    Yes [provider]  cinacalcet (SENSIPAR) 30 MG tablet Take 1 tablet (30 mg total) by mouth daily. Patient not taking: Reported on 04/29/2018 08/19/17   Burnadette Pop, MD  polyethylene glycol (MIRALAX / GLYCOLAX) packet Take 17 g by mouth daily as needed (constipation). Patient not taking: Reported on 04/29/2018 04/07/18   Shirley, [place], DO  senna-docusate (SENOKOT-S) 8.6-50 MG tablet Take 1 tablet by mouth at  bedtime. Patient not taking: Reported on 04/29/2018 04/07/18   Shirley, [place], DO    Family History Family History  Problem Relation Age of Onset  . Hypertension Mother   . Thyroid disease Mother   . Coronary artery disease Father   . Hypertension Father   . Diabetes Father   . Colon cancer Maternal Aunt   . Esophageal cancer Maternal Uncle   . Inflammatory bowel disease Neg Hx   . Liver disease Neg Hx   . Pancreatic cancer Neg Hx   . Rectal cancer Neg Hx   . Stomach cancer Neg Hx     Social History Social  History   Tobacco Use  . Smoking status: Never Smoker  . Smokeless tobacco: Never Used  Substance Use Topics  . Alcohol use: No  . Drug use: No     Allergies   Nsaids and Tolmetin   Review of Systems Review of Systems  Gastrointestinal: Negative for nausea.  Neurological: Positive for dizziness and weakness.  All other systems reviewed and are negative.    Physical Exam Updated Vital Signs BP 139/89   Pulse 89   Temp 98.3 F (36.8 C) (Oral)   Resp 18   Ht $R'5\' 7"'zR$  (1.702 m)   Wt 116.1 kg   SpO2 100%   BMI 40.10 kg/m   Physical Exam Vitals signs and nursing note reviewed.  Constitutional:      Appearance: She is well-developed.  HENT:     Head: Normocephalic.     Right Ear: Tympanic membrane normal.     Nose: Nose normal.     Mouth/Throat:     Mouth: Mucous membranes are moist.  Neck:     Musculoskeletal: Normal range of motion.  Cardiovascular:     Rate and Rhythm: Normal rate.     Pulses: Normal pulses.  Pulmonary:     Effort: Pulmonary effort is normal.  Abdominal:     General: Abdomen is flat. There is no distension.  Musculoskeletal: Normal range of motion.  Skin:    General: Skin is warm.  Neurological:     Mental Status: She is alert and oriented to person, place, and time.  Psychiatric:        Mood and Affect: Mood normal.      ED Treatments / Results  Labs (all labs ordered are listed, but only abnormal results are  displayed) Labs Reviewed  CBC WITH DIFFERENTIAL/PLATELET - Abnormal; Notable for the following components:      Result Value   RBC 3.20 (*)    Hemoglobin 8.9 (*)    HCT 28.4 (*)    RDW 16.1 (*)    nRBC 1.4 (*)    Abs Immature Granulocytes 0.09 (*)    All other components within normal limits  COMPREHENSIVE METABOLIC PANEL - Abnormal; Notable for the following components:   Chloride 94 (*)    BUN 53 (*)    Creatinine, Ser 15.58 (*)    Calcium 7.8 (*)    Albumin 3.2 (*)    GFR calc non Af Amer 2 (*)    GFR calc Af Amer 3 (*)    Anion gap 19 (*)    All other components within normal limits  TYPE AND SCREEN    EKG EKG Interpretation  Date/Time:  Friday April 29 2018 19:21:45 EDT Ventricular Rate:  94 PR Interval:    QRS Duration: 89 QT Interval:  412 QTC Calculation: 516 R Axis:   72 Text Interpretation:  Sinus rhythm ST elev, probable normal early repol pattern Prolonged QT interval When compared to prior, longer QTC, new S1Q3 pattern.  No STEMI Confirmed by Antony Blackbird [phone]) on 04/29/2018 7:25:47 PM   Radiology Dg Chest Port 1 View  Result Date: 04/29/2018 CLINICAL DATA:  Weakness EXAM: PORTABLE CHEST 1 VIEW COMPARISON:  04/06/2018 FINDINGS: Heart and mediastinal contours are within normal limits. No focal opacities or effusions. No acute bony abnormality. IMPRESSION: No active disease. Electronically Signed   By: Rolm Baptise M.D.   On: 04/29/2018 22:53    Procedures Procedures (including critical care time)  Medications Ordered in ED Medications - No data to display  Initial Impression / Assessment and Plan / ED Course  I have reviewed the triage vital signs and the nursing notes.  Pertinent labs & imaging results that were available during my care of the patient were reviewed by me and considered in my medical decision making (see chart for details).  Clinical Course as of Apr 29 2307  Fri Apr 29, 2018  2119 CBC with Differential/Platelet(!) [LS]  2119  Hemoglobin(!): 8.9 [LS]    Clinical Course User Index [LS] Fransico Meadow, PA-C       MDM Pt's hemoglobin is 8.9.  Chest xray is normal.  Pt counseled on symptoms.  She is scheduled to have home health visit and injection of epogen on Monday.   Final Clinical Impressions(s) / ED Diagnoses   Final diagnoses:  Weakness  Anemia, unspecified type    ED Discharge Orders    None    An After Visit Summary was printed and given to the patient.    Fransico Meadow, [place] 04/29/18 2315    Tegeler, Gwenyth Allegra, MD 04/29/18 [phone]

## 2018-04-29 NOTE — ED Notes (Signed)
Patient verbalizes understanding of discharge instructions. Opportunity for questioning and answers were provided. Armband removed by staff, pt discharged from ED.  

## 2018-04-29 NOTE — ED Notes (Signed)
Nice collecting labs.

## 2018-04-29 NOTE — Discharge Instructions (Addendum)
See your Physician for recheck on Monday °

## 2018-04-29 NOTE — ED Triage Notes (Signed)
Pt brought in by [facility] for weakness since Monday. Pt reports she also started feeling dizzy last night. Per EMS patient has a pmh of needing blood transfusions in the past. Pt reports that she feels like she normally does when her hemoglobin is low. Per EMS VSS. Pt does peritoneal dialysis at home. Pt denies any pain at present time.

## 2018-05-02 ENCOUNTER — Telehealth: Payer: Medicare Other | Admitting: Physician Assistant

## 2018-05-02 ENCOUNTER — Encounter: Payer: Self-pay | Admitting: Physician Assistant

## 2018-05-02 NOTE — Progress Notes (Signed)
Based on what you shared with me, I feel your condition warrants further evaluation and I recommend that you be seen for a face to face office visit.   Patricia [name],  As per our conversation, this Evisit was intended for your PCP. Please call your doctor's office of send a message directly to them.    NOTE: If you entered your credit card information for this eVisit, you will not be charged. You may see a "hold" on your card for the $35 but that hold will drop off and you will not have a charge processed.  If you are having a true medical emergency please call 911.  If you need an urgent face to face visit, [name] has four urgent care centers for your convenience.    PLEASE NOTE: THE [facility] LOCATIONS AND URGENT CARE CLINICS DO NOT HAVE THE TESTING FOR CORONAVIRUS COVID19 AVAILABLE.  IF YOU FEEL YOU NEED THIS TEST YOU MUST GO TO A TRIAGE LOCATION AT [facility]   [email or web address] to reserve your spot online an avoid wait times  [facility] [address] [place], [place] [address] Modified hours of operation: Monday-Friday, 10 AM to 6 PM  Saturday & Sunday 10 AM to 4 PM *Across the street from [place] (New Address!) [address] [place], [place] [address] *Just off [address], across the road from [place] hours of operation: Monday-Friday, 10 AM to 5 PM  Closed Saturday & Sunday   The following sites will take your insurance:  . [facility] [place] a Provider at this Location  [address] [place], [place] [address] . 10 am to 8 pm Monday-Friday . 12 pm to 8 pm Saturday-Sunday   . [facility] at [place] a Provider at this Location  [address], [place], [place] [address] . 8 am to 8 pm Monday-Friday  . 9 am to 6 pm Saturday . 11 am to 6 pm Sunday   . [facility] Urgent Care at [place] Get Driving Directions  [address] [place], [place] [address] . 8 am to 8 pm Monday-Friday . 8 am to 4 pm Saturday-Sunday   Your e-visit answers were reviewed by a board certified advanced clinical practitioner to complete your personal care plan.  Thank you for using e-Visits. I have spent 7 min in completion and review of this note- [name] [facility]

## 2018-05-20 ENCOUNTER — Other Ambulatory Visit: Payer: Self-pay

## 2018-05-20 ENCOUNTER — Encounter (HOSPITAL_COMMUNITY): Payer: Self-pay | Admitting: Emergency Medicine

## 2018-05-20 ENCOUNTER — Telehealth: Payer: Self-pay | Admitting: Gastroenterology

## 2018-05-20 ENCOUNTER — Inpatient Hospital Stay (HOSPITAL_COMMUNITY)
Admission: EM | Admit: 2018-05-20 | Discharge: 2018-05-23 | DRG: 377 | Disposition: A | Discharge status: Home Health | Payer: Medicare Other | Attending: Student in an Organized Health Care Education/Training Program | Admitting: Student in an Organized Health Care Education/Training Program

## 2018-05-20 DIAGNOSIS — Z6841 Body Mass Index (BMI) 40.0 and over, adult: Secondary | ICD-10-CM | POA: Diagnosis not present

## 2018-05-20 DIAGNOSIS — Z992 Dependence on renal dialysis: Secondary | ICD-10-CM | POA: Diagnosis not present

## 2018-05-20 DIAGNOSIS — K219 Gastro-esophageal reflux disease without esophagitis: Secondary | ICD-10-CM | POA: Diagnosis present

## 2018-05-20 DIAGNOSIS — I120 Hypertensive chronic kidney disease with stage 5 chronic kidney disease or end stage renal disease: Secondary | ICD-10-CM | POA: Diagnosis present

## 2018-05-20 DIAGNOSIS — N2581 Secondary hyperparathyroidism of renal origin: Secondary | ICD-10-CM | POA: Diagnosis present

## 2018-05-20 DIAGNOSIS — D62 Acute posthemorrhagic anemia: Secondary | ICD-10-CM | POA: Diagnosis present

## 2018-05-20 DIAGNOSIS — F419 Anxiety disorder, unspecified: Secondary | ICD-10-CM | POA: Diagnosis present

## 2018-05-20 DIAGNOSIS — K922 Gastrointestinal hemorrhage, unspecified: Secondary | ICD-10-CM | POA: Diagnosis present

## 2018-05-20 DIAGNOSIS — Z8673 Personal history of transient ischemic attack (TIA), and cerebral infarction without residual deficits: Secondary | ICD-10-CM | POA: Diagnosis not present

## 2018-05-20 DIAGNOSIS — Z833 Family history of diabetes mellitus: Secondary | ICD-10-CM

## 2018-05-20 DIAGNOSIS — M109 Gout, unspecified: Secondary | ICD-10-CM | POA: Diagnosis present

## 2018-05-20 DIAGNOSIS — Z79899 Other long term (current) drug therapy: Secondary | ICD-10-CM

## 2018-05-20 DIAGNOSIS — K921 Melena: Secondary | ICD-10-CM

## 2018-05-20 DIAGNOSIS — Z8349 Family history of other endocrine, nutritional and metabolic diseases: Secondary | ICD-10-CM

## 2018-05-20 DIAGNOSIS — E872 Acidosis: Secondary | ICD-10-CM | POA: Diagnosis present

## 2018-05-20 DIAGNOSIS — M1990 Unspecified osteoarthritis, unspecified site: Secondary | ICD-10-CM | POA: Diagnosis present

## 2018-05-20 DIAGNOSIS — I471 Supraventricular tachycardia: Secondary | ICD-10-CM | POA: Diagnosis present

## 2018-05-20 DIAGNOSIS — Q2733 Arteriovenous malformation of digestive system vessel: Secondary | ICD-10-CM | POA: Diagnosis not present

## 2018-05-20 DIAGNOSIS — E876 Hypokalemia: Secondary | ICD-10-CM | POA: Diagnosis present

## 2018-05-20 DIAGNOSIS — Z886 Allergy status to analgesic agent status: Secondary | ICD-10-CM | POA: Diagnosis not present

## 2018-05-20 DIAGNOSIS — Z800 Family history of malignant neoplasm of digestive organs: Secondary | ICD-10-CM

## 2018-05-20 DIAGNOSIS — E7800 Pure hypercholesterolemia, unspecified: Secondary | ICD-10-CM | POA: Diagnosis present

## 2018-05-20 DIAGNOSIS — Z8249 Family history of ischemic heart disease and other diseases of the circulatory system: Secondary | ICD-10-CM

## 2018-05-20 DIAGNOSIS — K5521 Angiodysplasia of colon with hemorrhage: Principal | ICD-10-CM | POA: Diagnosis present

## 2018-05-20 DIAGNOSIS — K5790 Diverticulosis of intestine, part unspecified, without perforation or abscess without bleeding: Secondary | ICD-10-CM | POA: Diagnosis not present

## 2018-05-20 DIAGNOSIS — I959 Hypotension, unspecified: Secondary | ICD-10-CM | POA: Diagnosis present

## 2018-05-20 DIAGNOSIS — D509 Iron deficiency anemia, unspecified: Secondary | ICD-10-CM | POA: Diagnosis present

## 2018-05-20 DIAGNOSIS — F329 Major depressive disorder, single episode, unspecified: Secondary | ICD-10-CM | POA: Diagnosis present

## 2018-05-20 DIAGNOSIS — G4700 Insomnia, unspecified: Secondary | ICD-10-CM | POA: Diagnosis not present

## 2018-05-20 DIAGNOSIS — Z1159 Encounter for screening for other viral diseases: Secondary | ICD-10-CM | POA: Diagnosis not present

## 2018-05-20 DIAGNOSIS — Z7682 Awaiting organ transplant status: Secondary | ICD-10-CM

## 2018-05-20 DIAGNOSIS — N186 End stage renal disease: Secondary | ICD-10-CM | POA: Diagnosis present

## 2018-05-20 DIAGNOSIS — Z940 Kidney transplant status: Secondary | ICD-10-CM | POA: Diagnosis not present

## 2018-05-20 DIAGNOSIS — E669 Obesity, unspecified: Secondary | ICD-10-CM | POA: Diagnosis present

## 2018-05-20 DIAGNOSIS — K5520 Angiodysplasia of colon without hemorrhage: Secondary | ICD-10-CM | POA: Diagnosis not present

## 2018-05-20 DIAGNOSIS — Z90710 Acquired absence of both cervix and uterus: Secondary | ICD-10-CM

## 2018-05-20 DIAGNOSIS — E8889 Other specified metabolic disorders: Secondary | ICD-10-CM | POA: Diagnosis present

## 2018-05-20 DIAGNOSIS — Z9889 Other specified postprocedural states: Secondary | ICD-10-CM | POA: Diagnosis not present

## 2018-05-20 DIAGNOSIS — Z8711 Personal history of peptic ulcer disease: Secondary | ICD-10-CM

## 2018-05-20 LAB — COMPREHENSIVE METABOLIC PANEL
ALT: 18 U/L (ref 0–44)
AST: 28 U/L (ref 15–41)
Albumin: 3.4 g/dL — ABNORMAL LOW (ref 3.5–5.0)
Alkaline Phosphatase: 75 U/L (ref 38–126)
Anion gap: 19 — ABNORMAL HIGH (ref 5–15)
BUN: 54 mg/dL — ABNORMAL HIGH (ref 6–20)
CO2: 21 mmol/L — ABNORMAL LOW (ref 22–32)
Calcium: 7.8 mg/dL — ABNORMAL LOW (ref 8.9–10.3)
Chloride: 97 mmol/L — ABNORMAL LOW (ref 98–111)
Creatinine, Ser: 15.37 mg/dL — ABNORMAL HIGH (ref 0.44–1.00)
GFR calc Af Amer: 3 mL/min — ABNORMAL LOW (ref >60)
GFR calc non Af Amer: 3 mL/min — ABNORMAL LOW (ref >60)
Glucose, Bld: 124 mg/dL — ABNORMAL HIGH (ref 70–99)
Potassium: 3.1 mmol/L — ABNORMAL LOW (ref 3.5–5.1)
Sodium: 137 mmol/L (ref 135–145)
Total Bilirubin: 0.5 mg/dL (ref 0.3–1.2)
Total Protein: 6.5 g/dL (ref 6.5–8.1)

## 2018-05-20 LAB — PREPARE RBC (CROSSMATCH)

## 2018-05-20 LAB — CREATININE, SERUM
Creatinine, Ser: 15.78 mg/dL — ABNORMAL HIGH (ref 0.44–1.00)
GFR calc Af Amer: 3 mL/min — ABNORMAL LOW (ref >60)
GFR calc non Af Amer: 2 mL/min — ABNORMAL LOW (ref >60)

## 2018-05-20 LAB — CBC
HCT: 13.6 % — ABNORMAL LOW (ref 36.0–46.0)
HCT: 23.6 % — ABNORMAL LOW (ref 36.0–46.0)
Hemoglobin: 4.4 g/dL — CL (ref 12.0–15.0)
Hemoglobin: 7.5 g/dL — ABNORMAL LOW (ref 12.0–15.0)
MCH: 30.1 pg (ref 26.0–34.0)
MCH: 28.7 pg (ref 26.0–34.0)
MCHC: 32.4 g/dL (ref 30.0–36.0)
MCHC: 31.8 g/dL (ref 30.0–36.0)
MCV: 93.2 fL (ref 80.0–100.0)
MCV: 90.4 fL (ref 80.0–100.0)
Platelets: 385 10*3/uL (ref 150–400)
Platelets: 291 10*3/uL (ref 150–400)
RBC: 1.46 MIL/uL — ABNORMAL LOW (ref 3.87–5.11)
RBC: 2.61 MIL/uL — ABNORMAL LOW (ref 3.87–5.11)
RDW: 18.7 % — ABNORMAL HIGH (ref 11.5–15.5)
RDW: 18.6 % — ABNORMAL HIGH (ref 11.5–15.5)
WBC: 11.9 10*3/uL — ABNORMAL HIGH (ref 4.0–10.5)
WBC: 8 10*3/uL (ref 4.0–10.5)
nRBC: 1.1 % — ABNORMAL HIGH (ref 0.0–0.2)
nRBC: 2.5 % — ABNORMAL HIGH (ref 0.0–0.2)

## 2018-05-20 LAB — SARS CORONAVIRUS 2 BY RT PCR (HOSPITAL ORDER, PERFORMED IN ~~LOC~~ HOSPITAL LAB): SARS Coronavirus 2: NEGATIVE

## 2018-05-20 MED ORDER — ACETAMINOPHEN 325 MG PO TABS
650.0000 mg | ORAL_TABLET | Freq: Four times a day (QID) | ORAL | Status: DC | PRN
  Administered 2018-05-23: 650 mg via ORAL
  Filled 2018-05-20: qty 2

## 2018-05-20 MED ORDER — SODIUM CHLORIDE 0.9 % IV SOLN
8.0000 mg/h | INTRAVENOUS | Status: DC
  Administered 2018-05-20 – 2018-05-23 (×5): 8 mg/h via INTRAVENOUS
  Filled 2018-05-20 (×8): qty 80

## 2018-05-20 MED ORDER — PROMETHAZINE HCL 25 MG PO TABS
12.5000 mg | ORAL_TABLET | Freq: Four times a day (QID) | ORAL | Status: DC | PRN
  Administered 2018-05-20 – 2018-05-22 (×4): 12.5 mg via ORAL
  Filled 2018-05-20 (×4): qty 1

## 2018-05-20 MED ORDER — HYDROMORPHONE HCL 1 MG/ML IJ SOLN
0.5000 mg | Freq: Four times a day (QID) | INTRAMUSCULAR | Status: DC | PRN
  Administered 2018-05-20 – 2018-05-22 (×5): 1 mg via INTRAVENOUS
  Filled 2018-05-20 (×5): qty 1

## 2018-05-20 MED ORDER — VENLAFAXINE HCL ER 75 MG PO CP24
75.0000 mg | ORAL_CAPSULE | Freq: Every day | ORAL | Status: DC
  Administered 2018-05-20 – 2018-05-22 (×3): 75 mg via ORAL
  Filled 2018-05-20 (×4): qty 1

## 2018-05-20 MED ORDER — SUCROFERRIC OXYHYDROXIDE 500 MG PO CHEW
1000.0000 mg | CHEWABLE_TABLET | Freq: Three times a day (TID) | ORAL | Status: DC
  Administered 2018-05-22 – 2018-05-23 (×2): 1000 mg via ORAL
  Filled 2018-05-20 (×9): qty 2

## 2018-05-20 MED ORDER — PANTOPRAZOLE SODIUM 40 MG IV SOLR: 40.0000 mg | Freq: Two times a day (BID) | INTRAVENOUS | Status: DC

## 2018-05-20 MED ORDER — ZOLPIDEM TARTRATE 5 MG PO TABS
5.0000 mg | ORAL_TABLET | Freq: Every day | ORAL | Status: DC
  Administered 2018-05-20 – 2018-05-22 (×3): 5 mg via ORAL
  Filled 2018-05-20 (×3): qty 1

## 2018-05-20 MED ORDER — METOPROLOL TARTRATE 25 MG PO TABS
25.0000 mg | ORAL_TABLET | Freq: Every day | ORAL | Status: DC
  Administered 2018-05-20: 25 mg via ORAL
  Filled 2018-05-20: qty 1

## 2018-05-20 MED ORDER — ATORVASTATIN CALCIUM 40 MG PO TABS
40.0000 mg | ORAL_TABLET | Freq: Every day | ORAL | Status: DC
  Administered 2018-05-20 – 2018-05-22 (×3): 40 mg via ORAL
  Filled 2018-05-20 (×3): qty 1

## 2018-05-20 MED ORDER — ACETAMINOPHEN 650 MG RE SUPP: 650.0000 mg | Freq: Four times a day (QID) | RECTAL | Status: DC | PRN

## 2018-05-20 MED ORDER — HEPARIN SODIUM (PORCINE) 5000 UNIT/ML IJ SOLN: 5000.0000 [IU] | Freq: Three times a day (TID) | INTRAMUSCULAR | Status: DC

## 2018-05-20 MED ORDER — SENNOSIDES-DOCUSATE SODIUM 8.6-50 MG PO TABS: 1.0000 | ORAL_TABLET | Freq: Every evening | ORAL | Status: DC | PRN

## 2018-05-20 MED ORDER — CALCITRIOL 0.5 MCG PO CAPS
0.5000 ug | ORAL_CAPSULE | Freq: Every day | ORAL | Status: DC
  Administered 2018-05-23: 0.5 ug via ORAL
  Filled 2018-05-20 (×2): qty 1

## 2018-05-20 MED ORDER — ALLOPURINOL 100 MG PO TABS
100.0000 mg | ORAL_TABLET | Freq: Every day | ORAL | Status: DC
  Administered 2018-05-20 – 2018-05-22 (×3): 100 mg via ORAL
  Filled 2018-05-20 (×3): qty 1

## 2018-05-20 MED ORDER — SODIUM CHLORIDE 0.9% IV SOLUTION: Freq: Once | INTRAVENOUS | Status: DC

## 2018-05-20 MED ORDER — SODIUM CHLORIDE 0.9 % IV SOLN
80.0000 mg | Freq: Once | INTRAVENOUS | Status: AC
  Administered 2018-05-20: 80 mg via INTRAVENOUS
  Filled 2018-05-20: qty 80

## 2018-05-20 NOTE — Telephone Encounter (Signed)
Patient called and states her stool for 2 days has been mostly liquid and black. No fever or nausea or other symptoms. Please advise

## 2018-05-20 NOTE — ED Provider Notes (Signed)
[facility] EMERGENCY DEPARTMENT Provider Note   CSN: [id number] Arrival date & time: 05/20/18  1804    History   Chief Complaint Chief Complaint  Patient presents with  . GI Bleeding    HPI Patricia [name] is a 43 y.o. female.     HPI 43 yo female with a hx of home peritoneal dialysis presents with melena and weakness for the past 4 days. Reports diarrhea and a hx of small bowel AVMs followed by [name]. Hx of blood transfusion in the past. Today felt lightheaded and weak. She spoke with her GI team who recommended evaluation in the ER. No other complaints at this time   Past Medical History:  Diagnosis Date  . Acid reflux   . Anxiety   . Arthritis   . Dyspnea   . Dysrhythmia    tachycardia  . ESRD (end stage renal disease) ([place])    [place]- [place]  . GI bleeding 12/2017  . Gout   . Headache(784.0)    "related to chemo; sometimes weekly" (09/12/2013)  . High cholesterol   . History of blood transfusion "a couple"   "related to low counts"  . Hypertension   . Iron deficiency anemia    "get epogen shots q month" (02/20/2014)  . 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 Peritoneal dialysis status [facility])     Patient Active Problem List   Diagnosis Date Noted  . Peritonitis ([place]) 04/03/2018  . History of peritonitis 03/29/2018  . Peritonitis associated with peritoneal dialysis ([place]) 02/22/2018  . Early satiety 02/18/2018  . AVM (arteriovenous malformation) of small bowel, acquired 02/18/2018  . Acute gastric ulcer without hemorrhage or perforation 02/18/2018  . Dark stools 02/18/2018  . Acute GI bleeding 12/23/2017  . Symptomatic anemia 10/28/2017  . Esophageal candidiasis ([place]) 09/01/2017  . Anxiety 09/01/2017  . Stroke ([place]) 08/15/2017  . Dysphagia 08/15/2017  . Melena 06/03/2017  . Abdominal pain 06/03/2017  . Dialysis AV fistula malfunction ([place]) 04/04/2017  . ESRD on  peritoneal dialysis ([place]) 04/01/2016  . Anemia due to chronic kidney disease 04/01/2016  . GERD (gastroesophageal reflux disease) 04/01/2016  . Hypomagnesemia 04/28/2015  . Acute renal failure superimposed on stage 3 chronic kidney disease ([place]) 04/12/2014  . Atypical chest pain   . Chest pain 02/19/2014  . Hypokalemia 02/19/2014  . Nephrotic syndrome 10/02/2013  . Dyslipidemia 10/02/2013  . PAT (paroxysmal atrial tachycardia) ([place]) 09/29/2013  . Normocytic anemia 06/29/2013  . Edema 06/29/2013  . Nausea without vomiting 06/29/2013  . Fever, unspecified 01/31/2013  . Glomerulonephritis 01/28/2013  . Syncope 01/28/2013  . Shortness of breath 07/04/2011  . Morbid obesity ([place]) 07/04/2011  . HTN (hypertension) 07/04/2011  . Hypercholesterolemia 07/04/2011    Past Surgical History:  Procedure Laterality Date  . ABDOMINAL HYSTERECTOMY  2010   "laparoscopic"  . ANKLE FRACTURE SURGERY Right 1994  . AV FISTULA PLACEMENT Left 04/14/2016   Procedure: ARTERIOVENOUS (AV) FISTULA CREATION;  Surgeon: Angelia Mould, MD;  Location: [facility] OR;  Service: Vascular;  Laterality: Left;  . AV FISTULA PLACEMENT Left 08/09/2017   Procedure: INSERTION OF ARTERIOVENOUS (AV) GORE-TEX GRAFT LEFT ARM;  Surgeon: Elam Dutch, MD;  Location: [facility];  Service: Vascular;  Laterality: Left;  . [place] REMOVAL Left 08/11/2017   Procedure: REMOVAL OF ARTERIOVENOUS GORETEX GRAFT ([place]);  Surgeon: Serafina Mitchell, MD;  Location[facility] OR;  Service: Vascular;  Laterality: Left;  .  BIOPSY  09/03/2017   Procedure: BIOPSY;  Surgeon: Otis Brace, MD;  Location: [place];  Service: Gastroenterology;;  . BIOPSY  12/24/2017   Procedure: BIOPSY;  Surgeon: Irving Copas., MD;  Location: [place];  Service: Gastroenterology;;  . CARDIAC CATHETERIZATION  2000's  . COLONOSCOPY WITH PROPOFOL N/A 09/04/2017   Procedure: COLONOSCOPY WITH PROPOFOL;  Surgeon: Ronnette Juniper, MD;  Location: [place];  Service:  Gastroenterology;  Laterality: N/A;  . ENTEROSCOPY N/A 12/24/2017   Procedure: ENTEROSCOPY;  Surgeon: Rush Landmark Telford Nab., MD;  Location: [place];  Service: Gastroenterology;  Laterality: N/A;  . ESOPHAGOGASTRODUODENOSCOPY    . ESOPHAGOGASTRODUODENOSCOPY (EGD) WITH PROPOFOL Left 06/04/2017   Procedure: ESOPHAGOGASTRODUODENOSCOPY (EGD) WITH PROPOFOL;  Surgeon: Arta Silence, MD;  Location: [place];  Service: Endoscopy;  Laterality: Left;  . ESOPHAGOGASTRODUODENOSCOPY (EGD) WITH PROPOFOL N/A 09/03/2017   Procedure: ESOPHAGOGASTRODUODENOSCOPY (EGD) WITH PROPOFOL;  Surgeon: Otis Brace, MD;  Location: [place];  Service: Gastroenterology;  Laterality: N/A;  . FISTULA SUPERFICIALIZATION Left 04/02/2017   Procedure: FISTULA SUPERFICIALIZATION LEFT ARM;  Surgeon: Serafina Mitchell, MD;  Location: [place];  Service: Vascular;  Laterality: Left;  . FISTULOGRAM Left 04/07/2016   Procedure: FISTULOGRAM;  Surgeon: Waynetta Sandy, MD;  Location: [place];  Service: Vascular;  Laterality: Left;  . FRACTURE SURGERY     right ankle  . GIVENS CAPSULE STUDY N/A 10/29/2017   Procedure: GIVENS CAPSULE STUDY;  Surgeon: Wilford Corner, MD;  Location: [place];  Service: Endoscopy;  Laterality: N/A;  . GIVENS CAPSULE STUDY N/A 12/24/2017   Procedure: GIVENS CAPSULE STUDY;  Surgeon: Irving Copas., MD;  Location: [place];  Service: Gastroenterology;  Laterality: N/A;  . INSERTION OF DIALYSIS CATHETER Right 04/07/2016   Procedure: INSERTION OF DIALYSIS CATHETER;  Surgeon: Waynetta Sandy, MD;  Location: [place];  Service: Vascular;  Laterality: Right;  . INSERTION OF DIALYSIS CATHETER Right 04/04/2017   Procedure: INSERTION OF DIALYSIS CATHETER;  Surgeon: Waynetta Sandy, MD;  Location: [place];  Service: Vascular;  Laterality: Right;  . LIGATION OF ARTERIOVENOUS  FISTULA Left 04/14/2016   Procedure: LIGATION OF ARTERIOVENOUS  FISTULA;  Surgeon: Angelia Mould, MD;  Location: [place];  Service: Vascular;  Laterality: Left;  . PATCH ANGIOPLASTY Left 06/19/2016   Procedure: PATCH ANGIOPLASTY LEFT ARTERIOVENOUS FISTULA;  Surgeon: Angelia Mould, MD;  Location: [place];  Service: Vascular;  Laterality: Left;  . POLYPECTOMY  09/04/2017   Procedure: POLYPECTOMY;  Surgeon: Ronnette Juniper, MD;  Location: [facility] ENDOSCOPY;  Service: Gastroenterology;;  . REVISON OF ARTERIOVENOUS FISTULA Left 06/19/2016   Procedure: REVISION SUPERFICIALIZATION OF BRACHIOCEPHALIC ARTERIOVENOUS FISTULA;  Surgeon: Angelia Mould, MD;  Location: [facility] OR;  Service: Vascular;  Laterality: Left;     OB History    Gravida  3   Para      Term      Preterm      AB      Living        SAB      TAB      Ectopic      Multiple  3   Live Births               Home Medications    Prior to Admission medications   Medication Sig Start Date End Date Taking? Authorizing Provider  allopurinol (ZYLOPRIM) 100 MG tablet Take 100 mg by mouth at bedtime.     [provider]  ALPRAZolam Duanne Moron) 1 MG tablet Take 1  mg by mouth 2 (two) times daily as needed for anxiety.  03/19/17   [provider]  atorvastatin (LIPITOR) 40 MG tablet Take 40 mg by mouth at bedtime.  03/14/14   [provider]  calcitRIOL (ROCALTROL) 0.5 MCG capsule Take 1 capsule (0.5 mcg total) by mouth daily with breakfast. Patient taking differently: Take 0.5 mcg by mouth at bedtime.  06/04/17   Bonnielee Haff, MD  cinacalcet (SENSIPAR) 30 MG tablet Take 1 tablet (30 mg total) by mouth daily. Patient not taking: Reported on 04/29/2018 08/19/17   Shelly Coss, MD  diclofenac sodium (VOLTAREN) 1 % GEL Apply 1 application topically 4 (four) times daily as needed for pain. 03/20/18   [provider]  gentamicin ointment (GARAMYCIN) 0.1 % Apply 1 application topically daily as needed (changing dressing.).    [provider]  HYDROcodone-acetaminophen  (NORCO/VICODIN) 5-325 MG tablet Take 1 tablet by mouth every 4 (four) hours as needed for pain. 03/30/18   [provider]  hydrOXYzine (ATARAX/VISTARIL) 25 MG tablet Take 25 mg by mouth every 6 (six) hours as needed for itching.  01/25/18   [provider]  metoprolol tartrate (LOPRESSOR) 25 MG tablet Take 25 mg by mouth at bedtime.  04/17/16   [provider]  multivitamin (RENA-VIT) TABS tablet Take 1 tablet by mouth at bedtime.     [provider]  omeprazole (PRILOSEC) 40 MG capsule Take 40 mg by mouth 2 (two) times daily.    [provider]  polyethylene glycol (MIRALAX / GLYCOLAX) packet Take 17 g by mouth daily as needed (constipation). Patient not taking: Reported on 04/29/2018 04/07/18   Shirley, [place], DO  prochlorperazine (COMPAZINE) 10 MG tablet Take 1 tablet (10 mg total) by mouth every 8 (eight) hours as needed for nausea or vomiting. 02/17/18   Mansouraty, Telford Nab., MD  promethazine (PHENERGAN) 25 MG tablet Take 1 tablet (25 mg total) by mouth every 8 (eight) hours as needed for nausea or vomiting. 04/07/18   Shirley, [place], DO  senna (SENOKOT) 8.6 MG TABS tablet Take 2 tablets (17.2 mg total) by mouth at bedtime. Patient taking differently: Take 2 tablets by mouth at bedtime as needed for mild constipation.  04/15/16   Nita Sells, MD  senna-docusate (SENOKOT-S) 8.6-50 MG tablet Take 1 tablet by mouth at bedtime. Patient not taking: Reported on 04/29/2018 04/07/18   Shirley, [place], DO  sucroferric oxyhydroxide (VELPHORO) 500 MG chewable tablet Chew 1,000 mg by mouth 3 (three) times daily before meals.     [provider]  venlafaxine XR (EFFEXOR-XR) 75 MG 24 hr capsule Take 75 mg by mouth at bedtime.     [provider]    Family History Family History  Problem Relation Age of Onset  . Hypertension Mother   . Thyroid disease Mother   . Coronary artery disease Father   . Hypertension Father   . Diabetes Father    . Colon cancer Maternal Aunt   . Esophageal cancer Maternal Uncle   . Inflammatory bowel disease Neg Hx   . Liver disease Neg Hx   . Pancreatic cancer Neg Hx   . Rectal cancer Neg Hx   . Stomach cancer Neg Hx     Social History Social History   Tobacco Use  . Smoking status: Never Smoker  . Smokeless tobacco: Never Used  Substance Use Topics  . Alcohol use: No  . Drug use: No     Allergies   Nsaids and Tolmetin  Review of Systems Review of Systems  All other systems reviewed and are negative.    Physical Exam Updated Vital Signs BP 126/82 (BP Location: Right Arm)   Pulse 85   Temp 98.5 F (36.9 C) (Oral)   Resp 20   SpO2 100%   Physical Exam Vitals signs and nursing note reviewed.  Constitutional:      General: She is not in acute distress.    Appearance: She is well-developed.  HENT:     Head: Normocephalic and atraumatic.  Neck:     Musculoskeletal: Normal range of motion.  Cardiovascular:     Rate and Rhythm: Normal rate and regular rhythm.     Heart sounds: Normal heart sounds.  Pulmonary:     Effort: Pulmonary effort is normal.     Breath sounds: Normal breath sounds.  Abdominal:     General: There is no distension.     Palpations: Abdomen is soft.     Tenderness: There is no abdominal tenderness.  Musculoskeletal: Normal range of motion.  Skin:    General: Skin is warm and dry.  Neurological:     Mental Status: She is alert and oriented to person, place, and time.  Psychiatric:        Judgment: Judgment normal.      ED Treatments / Results  Labs (all labs ordered are listed, but only abnormal results are displayed) Labs Reviewed  COMPREHENSIVE METABOLIC PANEL - Abnormal; Notable for the following components:      Result Value   Potassium 3.1 (*)    Chloride 97 (*)    CO2 21 (*)    Glucose, Bld 124 (*)    BUN 54 (*)    Creatinine, Ser 15.37 (*)    Calcium 7.8 (*)    Albumin 3.4 (*)    GFR calc non Af Amer 3 (*)    GFR calc Af  Amer 3 (*)    Anion gap 19 (*)    All other components within normal limits  CBC - Abnormal; Notable for the following components:   WBC 11.9 (*)    RBC 1.46 (*)    Hemoglobin 4.4 (*)    HCT 13.6 (*)    RDW 18.7 (*)    nRBC 1.1 (*)    All other components within normal limits  I-STAT BETA HCG BLOOD, ED ([facility], [facility], [facility] ONLY)  POC OCCULT BLOOD, ED  TYPE AND SCREEN  PREPARE RBC (CROSSMATCH)    EKG None  Radiology No results found.  Procedures .Critical Care Performed by: Jola Schmidt, MD Authorized by: Jola Schmidt, MD   Critical care provider statement:    Critical care time (minutes):  35   Critical care was time spent personally by me on the following activities:  Discussions with consultants, evaluation of patient's response to treatment, examination of patient, ordering and performing treatments and interventions, ordering and review of laboratory studies, ordering and review of radiographic studies, pulse oximetry, re-evaluation of patient's condition, obtaining history from patient or surrogate and review of old charts   (including critical care time)  Medications Ordered in ED Medications  0.9 %  sodium chloride infusion (Manually program via Guardrails IV Fluids) (has no administration in time range)  pantoprazole (PROTONIX) 80 mg in sodium chloride 0.9 % 100 mL IVPB (has no administration in time range)  pantoprazole (PROTONIX) 80 mg in sodium chloride 0.9 % 250 mL (0.32 mg/mL) infusion (has no administration in time range)  pantoprazole (PROTONIX) injection 40 mg (  has no administration in time range)     Initial Impression / Assessment and Plan / ED Course  I have reviewed the triage vital signs and the nursing notes.  Pertinent labs & imaging results that were available during my care of the patient were reviewed by me and considered in my medical decision making (see chart for details).       GI bleed, melena with anemia. Blood transfusion now. GI  consultation in AM. 3 units blood transfusion. Home peritoneal dialysis pt will need nephrology consultation in hospital   Final Clinical Impressions(s) / ED Diagnoses   Final diagnoses:  Gastrointestinal hemorrhage with melena    ED Discharge Orders    None       Jola Schmidt, MD 05/20/18 2003

## 2018-05-20 NOTE — H&P (Addendum)
Date: 05/20/2018               Patient Name:  Patricia [name] MRN: [id number]  DOB: [birth year] Age / Sex: 43 y.o., female   PCP: Elayne Guerin         Medical Service: Internal Medicine Teaching Service         Attending Physician: Dr. Evette Doffing, Mallie Mussel, *    First Contact: Dr. Myrtie Hawk Pager: [phone]  Second Contact: Dr. Shan Levans Pager: [phone]       After Hours (After 5p/  First Contact Pager: [phone]  weekends / holidays): Second Contact Pager: [phone]   Chief Complaint: Melena  History of Present Illness: Patricia [name] is a 43 year old female with ESRD on peritoneal HD, HTN, paroxysmal atrial tachycardia, history of stroke, chronic anemia thought to be due to arteriovenous malformation of the small bowel, diverticulosis, GERD who presents with melena.  She states that 4 days ago she began to have multiple episodes of black-colored diarrhea and stent generalized abdominal pain (worse on the right side).  She has also had lightheadedness, weakness, nausea without vomiting, and decreased appetitie over the last several days and developed dyspnea on exertion.  She called her GI doctor's office and they recommended her come to the ED.   She has had multiple ED/hospital admissions for abdominal pain and GI bleeding over the last several months most recently being in April.  She has had multiple GI studies which have revealed arteriovenous malformations, nonbleeding gastric ulcer, and diverticulosis. She has a long history of GI bleeding she has a history of arteriovenous malformations and is followed by Dr. Rush Landmark with [facility] gastroenterology.   She denies NSAID use including Goody powders and is not on a blood thinner.  In the ED she was found to have normal vital signs.  Labs were significant for hypokalemia 3.1, elevated creatinine of 15.37 (consistent with ESRD), anion gap metabolic acidosis, normocytic anemia with hemoglobin of 4.4 (baseline 8-9.5).  She received 3  units of PRBC in the ED and started on IV Protonix.  Meds:  Current Facility-Administered Medications for the 05/20/18 encounter [facility] Encounter)  Medication  . 0.9 %  sodium chloride infusion   Current Meds  Medication Sig  . allopurinol (ZYLOPRIM) 100 MG tablet Take 100 mg by mouth at bedtime.   . ALPRAZolam (XANAX) 1 MG tablet Take 1 mg by mouth 2 (two) times daily as needed for anxiety.   Marland Kitchen atorvastatin (LIPITOR) 40 MG tablet Take 40 mg by mouth at bedtime.   . calcitRIOL (ROCALTROL) 0.5 MCG capsule Take 1 capsule (0.5 mcg total) by mouth daily with breakfast. (Patient taking differently: Take 0.5 mcg by mouth at bedtime. )  . gentamicin ointment (GARAMYCIN) 0.1 % Apply 1 application topically daily as needed (changing dressing.).  Marland Kitchen hydrOXYzine (ATARAX/VISTARIL) 25 MG tablet Take 25 mg by mouth every 6 (six) hours as needed for itching.   . metoprolol tartrate (LOPRESSOR) 25 MG tablet Take 25 mg by mouth at bedtime.   . multivitamin (RENA-VIT) TABS tablet Take 1 tablet by mouth at bedtime.   Marland Kitchen omeprazole (PRILOSEC) 40 MG capsule Take 40 mg by mouth 2 (two) times daily.  . prochlorperazine (COMPAZINE) 10 MG tablet Take 1 tablet (10 mg total) by mouth every 8 (eight) hours as needed for nausea or vomiting.  . promethazine (PHENERGAN) 25 MG tablet Take 1 tablet (25 mg total) by mouth every 8 (eight) hours as needed for nausea or vomiting.  . sucroferric  oxyhydroxide (VELPHORO) 500 MG chewable tablet Chew 1,000 mg by mouth 3 (three) times daily before meals.   . venlafaxine XR (EFFEXOR-XR) 75 MG 24 hr capsule Take 75 mg by mouth at bedtime.   Marland Kitchen zolpidem (AMBIEN) 5 MG tablet Take 5 mg by mouth at bedtime.    Allergies: Allergies as of 05/20/2018 - Review Complete 05/20/2018  Allergen Reaction Noted  . Nsaids Other (See Comments) 01/27/2013  . Tolmetin Other (See Comments) 07/20/2015   Past Medical History:  Diagnosis Date  . Acid reflux   . Anxiety   . Arthritis   . Dyspnea    . Dysrhythmia    tachycardia  . ESRD (end stage renal disease) ([place])    [place]- [place]  . GI bleeding 12/2017  . Gout   . Headache(784.0)    "related to chemo; sometimes weekly" (09/12/2013)  . High cholesterol   . History of blood transfusion "a couple"   "related to low counts"  . Hypertension   . Iron deficiency anemia    "get epogen shots q month" (02/20/2014)  . 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 Peritoneal dialysis status [facility])     Family History:  Family History  Problem Relation Age of Onset  . Hypertension Mother   . Thyroid disease Mother   . Coronary artery disease Father   . Hypertension Father   . Diabetes Father   . Colon cancer Maternal Aunt   . Esophageal cancer Maternal Uncle   . Inflammatory bowel disease Neg Hx   . Liver disease Neg Hx   . Pancreatic cancer Neg Hx   . Rectal cancer Neg Hx   . Stomach cancer Neg Hx     Social History: She currently lives with her daughters in [place].  She is unemployed.  She used to be a CNA. She is a never smoker and denies alcohol and illicit drug use.   Review of Systems: A complete ROS was negative except as per HPI.   Physical Exam: Blood pressure 126/82, pulse 85, temperature 98.5 F (36.9 C), temperature source Oral, resp. rate 20, SpO2 100 %. General: Lying in bed in no acute distress HEENT: Normocephalic and atraumatic, EOMI Cardiovascular: Normal rate, regular rhythm Respiratory: Clear to auscultation bilaterally, normal work of breathing, normal oxygen saturation on room air Abdomen: Tenderness to palpation in all 4 quadrants (worse at the right upper quadrant).  Soft.  No masses appreciated. Skin: Warm and dry MSK: No lower extremity edema, erythema, or warmth noted bilaterally Psych: Normal affect, mood, behavior  EKG: personally reviewed my interpretation is normal sinus rhythm.  Assessment & Plan by Problem: Active Problems:    GI bleed  Patricia [name] is a 43 year old female with ESRD on peritoneal HD, AVMs, gastric ulcer, and diverticulosis who presents with melena and found to have severe anemia.   GI bleed with acute blood loss anemia: Likely due to known arteriovenous malformations versus diverticulosis versus gastric ulcers. - Currently receiving 3 units PRBCs; follow-up posttransfusion CBC - Continue pantoprazole 40 mg twice daily - Consult gastroenterology in the morning - Dilaudid 0.5 to 1 mg every 6 hours PRN pain  ESRD on peritoneal dialysis: - Consult nephrology for inpatient HD  Paradoxical atrial tachycardia/HTN: Heart rate and BP within acceptable limits. -Continuous cardiac monitoring -Hold home metoprolol in the setting of active GI bleed  MDD/Insomnia: -Continue home venlafaxine 75 mg daily -Continue home zolpidem 5 mg at  bedtime  FEN/GI: N.p.o. DVT prophylaxis: SCDs CODE STATUS: Full  Dispo: Admit patient to Inpatient with expected length of stay greater than 2 midnights.  Signed: Carroll Sage, MD 05/20/2018, 8:04 PM  Pager: [phone]

## 2018-05-20 NOTE — Telephone Encounter (Signed)
Patient will have daughter take her to [facility] ED

## 2018-05-20 NOTE — Telephone Encounter (Signed)
Her symptoms are concerning for GI bleed with known history of small bowel AVMs.  Please advise patient to come to ER for evaluation.

## 2018-05-20 NOTE — Telephone Encounter (Signed)
Thank you for update. If she is admitted we will see her in consultation. GM

## 2018-05-20 NOTE — ED Notes (Signed)
ED TO INPATIENT HANDOFF REPORT  ED Nurse Name and Phone #:  Patty [phone]  S Name/Age/Gender Patricia [name] 43 y.o. female Room/Bed: 024C/024C  Code Status   Code Status: Full Code  Home/SNF/Other Home Patient oriented to: self, place, time and situation Is this baseline? Yes   Triage Complete: Triage complete  Chief Complaint SOB,Dizziness,Rectal Bleeding    Triage Note Patient reports dark stools x 2-3 days - hx GI bleeding in the past. Not on blood thinner - states she has abdominal pain and diarrhea as well. Denies fevers/chills, but states she's had dizziness and shortness of breath since bleeding started.    Allergies Allergies  Allergen Reactions  . Nsaids Other (See Comments)    Cannot take due to Kidney disease/Kidney function   . Tolmetin Other (See Comments)    Cannot take due to Kidney Disease    Level of Care/Admitting Diagnosis ED Disposition    ED Disposition Condition [place]: [place] [[id number]]  Level of Care: Telemetry Medical [104]  Covid Evaluation: N/A  Diagnosis: GI bleed [[id number]]  Admitting Physician: Axel Filler [[id number]]  Attending Physician: Axel Filler [[id number]]  Estimated length of stay: past midnight tomorrow  Certification:: I certify this patient will need inpatient services for at least 2 midnights  PT Class (Do Not Modify): Inpatient [101]  PT Acc Code (Do Not Modify): Private [1]       B Medical/Surgery History Past Medical History:  Diagnosis Date  . Acid reflux   . Anxiety   . Arthritis   . Dyspnea   . Dysrhythmia    tachycardia  . ESRD (end stage renal disease) ([place])    [facility]SAT- [place]  . GI bleeding 12/2017  . Gout   . Headache(784.0)    "related to chemo; sometimes weekly" (09/12/2013)  . High cholesterol   . History of blood transfusion "a couple"   "related to low counts"  . Hypertension   . Iron deficiency anemia    "get epogen shots q  month" (02/20/2014)  . 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 Peritoneal dialysis status [facility])    Past Surgical History:  Procedure Laterality Date  . ABDOMINAL HYSTERECTOMY  2010   "laparoscopic"  . ANKLE FRACTURE SURGERY Right 1994  . AV FISTULA PLACEMENT Left 04/14/2016   Procedure: ARTERIOVENOUS (AV) FISTULA CREATION;  Surgeon: Angelia Mould, MD;  Location: [facility] OR;  Service: Vascular;  Laterality: Left;  . AV FISTULA PLACEMENT Left 08/09/2017   Procedure: INSERTION OF ARTERIOVENOUS (AV) GORE-TEX GRAFT LEFT ARM;  Surgeon: Elam Dutch, MD;  Location: [place];  Service: Vascular;  Laterality: Left;  . [place] REMOVAL Left 08/11/2017   Procedure: REMOVAL OF ARTERIOVENOUS GORETEX GRAFT ([place]);  Surgeon: Serafina Mitchell, MD;  Location: [facility] OR;  Service: Vascular;  Laterality: Left;  . BIOPSY  09/03/2017   Procedure: BIOPSY;  Surgeon: Otis Brace, MD;  Location: [place];  Service: Gastroenterology;;  . BIOPSY  12/24/2017   Procedure: BIOPSY;  Surgeon: Irving Copas., MD;  Location: [place];  Service: Gastroenterology;;  . CARDIAC CATHETERIZATION  2000's  . COLONOSCOPY WITH PROPOFOL N/A 09/04/2017   Procedure: COLONOSCOPY WITH PROPOFOL;  Surgeon: Ronnette Juniper, MD;  Location: [place];  Service: Gastroenterology;  Laterality: N/A;  . ENTEROSCOPY N/A 12/24/2017   Procedure: ENTEROSCOPY;  Surgeon: Rush Landmark Telford Nab., MD;  Location: [place];  Service:  Gastroenterology;  Laterality: N/A;  . ESOPHAGOGASTRODUODENOSCOPY    . ESOPHAGOGASTRODUODENOSCOPY (EGD) WITH PROPOFOL Left 06/04/2017   Procedure: ESOPHAGOGASTRODUODENOSCOPY (EGD) WITH PROPOFOL;  Surgeon: Willis Modena, MD;  Location: [facility] ENDOSCOPY;  Service: Endoscopy;  Laterality: Left;  . ESOPHAGOGASTRODUODENOSCOPY (EGD) WITH PROPOFOL N/A 09/03/2017   Procedure: ESOPHAGOGASTRODUODENOSCOPY (EGD) WITH PROPOFOL;  Surgeon: Kathi Der, MD;   Location: [facility] ENDOSCOPY;  Service: Gastroenterology;  Laterality: N/A;  . FISTULA SUPERFICIALIZATION Left 04/02/2017   Procedure: FISTULA SUPERFICIALIZATION LEFT ARM;  Surgeon: Nada Libman, MD;  Location: [facility] OR;  Service: Vascular;  Laterality: Left;  . FISTULOGRAM Left 04/07/2016   Procedure: FISTULOGRAM;  Surgeon: Maeola Harman, MD;  Location[facility] OR;  Service: Vascular;  Laterality: Left;  . FRACTURE SURGERY     right ankle  . GIVENS CAPSULE STUDY N/A 10/29/2017   Procedure: GIVENS CAPSULE STUDY;  Surgeon: Charlott Rakes, MD;  Location: [facility] ENDOSCOPY;  Service: Endoscopy;  Laterality: N/A;  . GIVENS CAPSULE STUDY N/A 12/24/2017   Procedure: GIVENS CAPSULE STUDY;  Surgeon: Lemar Lofty., MD;  Location: [facility] ENDOSCOPY;  Service: Gastroenterology;  Laterality: N/A;  . INSERTION OF DIALYSIS CATHETER Right 04/07/2016   Procedure: INSERTION OF DIALYSIS CATHETER;  Surgeon: Maeola Harman, MD;  Location: [facility] OR;  Service: Vascular;  Laterality: Right;  . INSERTION OF DIALYSIS CATHETER Right 04/04/2017   Procedure: INSERTION OF DIALYSIS CATHETER;  Surgeon: Maeola Harman, MD;  Location: [facility] OR;  Service: Vascular;  Laterality: Right;  . LIGATION OF ARTERIOVENOUS  FISTULA Left 04/14/2016   Procedure: LIGATION OF ARTERIOVENOUS  FISTULA;  Surgeon: Chuck Hint, MD;  Location[facility] OR;  Service: Vascular;  Laterality: Left;  . PATCH ANGIOPLASTY Left 06/19/2016   Procedure: PATCH ANGIOPLASTY LEFT ARTERIOVENOUS FISTULA;  Surgeon: Chuck Hint, MD;  Location: [facility] OR;  Service: Vascular;  Laterality: Left;  . POLYPECTOMY  09/04/2017   Procedure: POLYPECTOMY;  Surgeon: Kerin Salen, MD;  Location: [facility] ENDOSCOPY;  Service: Gastroenterology;;  . REVISON OF ARTERIOVENOUS FISTULA Left 06/19/2016   Procedure: REVISION SUPERFICIALIZATION OF BRACHIOCEPHALIC ARTERIOVENOUS FISTULA;  Surgeon: Chuck Hint, MD;  Location: [facility] OR;  Service: Vascular;  Laterality:  Left;     A IV Location/Drains/Wounds Patient Lines/Drains/Airways Status   Active Line/Drains/Airways    Name:   Placement date:   Placement time:   Site:   Days:   Peripheral IV 05/20/18 Right Arm   05/20/18    1853    Arm   less than 1          Intake/Output Last 24 hours No intake or output data in the 24 hours ending 05/20/18 2028  Labs/Imaging Results for orders placed or performed during the hospital encounter of 05/20/18 (from the past 48 hour(s))  Comprehensive metabolic panel     Status: Abnormal   Collection Time: 05/20/18  6:30 PM  Result Value Ref Range   Sodium 137 135 - 145 mmol/L   Potassium 3.1 (L) 3.5 - 5.1 mmol/L   Chloride 97 (L) 98 - 111 mmol/L   CO2 21 (L) 22 - 32 mmol/L   Glucose, Bld 124 (H) 70 - 99 mg/dL   BUN 54 (H) 6 - 20 mg/dL   Creatinine, Ser 39.32 (H) 0.44 - 1.00 mg/dL   Calcium 7.8 (L) 8.9 - 10.3 mg/dL   Total Protein 6.5 6.5 - 8.1 g/dL   Albumin 3.4 (L) 3.5 - 5.0 g/dL   AST 28 15 - 41 U/L   ALT 18 0 - 44 U/L  Alkaline Phosphatase 75 38 - 126 U/L   Total Bilirubin 0.5 0.3 - 1.2 mg/dL   GFR calc non Af Amer 3 (L) >60 mL/min   GFR calc Af Amer 3 (L) >60 mL/min   Anion gap 19 (H) 5 - 15    Comment: Performed at [facility] [address] [place] [address]  CBC     Status: Abnormal   Collection Time: 05/20/18  6:30 PM  Result Value Ref Range   WBC 11.9 (H) 4.0 - 10.5 K/uL   RBC 1.46 (L) 3.87 - 5.11 MIL/uL   Hemoglobin 4.4 (LL) 12.0 - 15.0 g/dL    Comment: REPEATED TO VERIFY THIS CRITICAL RESULT HAS VERIFIED AND BEEN CALLED TO KELLY GIBSON,RN BY ZELDA BEECH ON 05 15 2020 AT 1919, AND HAS BEEN READ BACK.     HCT 13.6 (L) 36.0 - 46.0 %   MCV 93.2 80.0 - 100.0 fL   MCH 30.1 26.0 - 34.0 pg   MCHC 32.4 30.0 - 36.0 g/dL   RDW 18.7 (H) 11.5 - 15.5 %   Platelets 385 150 - 400 K/uL   nRBC 1.1 (H) 0.0 - 0.2 %    Comment: Performed at [facility] [address], [place], [place] [address]  Type and screen [place]     Status: None (Preliminary result)   Collection Time: 05/20/18  6:54 PM  Result Value Ref Range   ABO/RH(D) O NEG    Antibody Screen NEG    Sample Expiration      05/23/2018,2359 Performed at [facility], [place] [address] [place], [place] [address]    Unit Number [id number]    Blood Component Type RED CELLS,LR    Unit division 00    Status of Unit ALLOCATED    Transfusion Status OK TO TRANSFUSE    Crossmatch Result Compatible    Unit Number [id number]    Blood Component Type RED CELLS,LR    Unit division 00    Status of Unit ALLOCATED    Transfusion Status OK TO TRANSFUSE    Crossmatch Result Compatible    Unit Number [id number]    Blood Component Type RED CELLS,LR    Unit division 00    Status of Unit ALLOCATED    Transfusion Status OK TO TRANSFUSE    Crossmatch Result Compatible   Prepare RBC     Status: None   Collection Time: 05/20/18  8:12 PM  Result Value Ref Range   Order Confirmation      ORDER PROCESSED BY BLOOD BANK Performed at [facility], [place] [address] [place], [place] [address]    No results found.  Pending Labs Unresulted Labs (From admission, onward)    Start     Ordered   05/21/18 6270  Basic metabolic panel  Tomorrow morning,   R     05/20/18 2008   05/21/18 0500  CBC  Tomorrow morning,   R     05/20/18 2008   05/20/18 2006  HIV antibody (Routine Testing)  Once,   R     05/20/18 2008   05/20/18 2006  CBC  (heparin)  Once,   R    Comments:  Baseline for heparin therapy IF NOT ALREADY DRAWN.  Notify MD if PLT < 100 K.    05/20/18 2008   05/20/18 2006  Creatinine, serum  (heparin)  Once,   R    Comments:  Baseline for  heparin therapy IF NOT ALREADY DRAWN.    05/20/18 2008   05/20/18 2005  SARS Coronavirus 2 (CEPHEID - Performed in [place] hospital lab), Hosp Order  (Asymptomatic Patients Labs)  Once,   R    Question:  Rule Out  Answer:  Yes   05/20/18 2004          Vitals/Pain Today's Vitals    05/20/18 1823 05/20/18 1825  BP:  126/82  Pulse:  85  Resp:  20  Temp:  98.5 F (36.9 C)  TempSrc:  Oral  SpO2:  100%  PainSc: 6      Isolation Precautions No active isolations  Medications Medications  0.9 %  sodium chloride infusion (Manually program via Guardrails IV Fluids) (has no administration in time range)  pantoprazole (PROTONIX) 80 mg in sodium chloride 0.9 % 100 mL IVPB (80 mg Intravenous New Bag/Given 05/20/18 2027)  pantoprazole (PROTONIX) 80 mg in sodium chloride 0.9 % 250 mL (0.32 mg/mL) infusion (has no administration in time range)  pantoprazole (PROTONIX) injection 40 mg (has no administration in time range)  acetaminophen (TYLENOL) tablet 650 mg (has no administration in time range)    Or  acetaminophen (TYLENOL) suppository 650 mg (has no administration in time range)  senna-docusate (Senokot-S) tablet 1 tablet (has no administration in time range)  promethazine (PHENERGAN) tablet 12.5 mg (has no administration in time range)    Mobility walks Low fall risk   Focused Assessments Cardiac Assessment Handoff:  Cardiac Rhythm: Normal sinus rhythm Lab Results  Component Value Date   CKTOTAL 257 (H) 04/28/2015   CKMB 2.5 07/04/2011   TROPONINI <0.03 05/11/2016   Lab Results  Component Value Date   DDIMER 1.79 (H) 11/29/2015   Does the Patient currently have chest pain? No     R Recommendations: See Admitting Provider Note  Report given to:   Additional Notes:

## 2018-05-20 NOTE — ED Triage Notes (Signed)
Patient reports dark stools x 2-3 days - hx GI bleeding in the past. Not on blood thinner - states she has abdominal pain and diarrhea as well. Denies fevers/chills, but states she's had dizziness and shortness of breath since bleeding started.

## 2018-05-20 NOTE — Progress Notes (Signed)
Called RN for report. Room ready.

## 2018-05-21 ENCOUNTER — Encounter (HOSPITAL_COMMUNITY): Payer: Self-pay | Admitting: Student in an Organized Health Care Education/Training Program

## 2018-05-21 DIAGNOSIS — K5520 Angiodysplasia of colon without hemorrhage: Secondary | ICD-10-CM

## 2018-05-21 DIAGNOSIS — F329 Major depressive disorder, single episode, unspecified: Secondary | ICD-10-CM

## 2018-05-21 DIAGNOSIS — Z79899 Other long term (current) drug therapy: Secondary | ICD-10-CM

## 2018-05-21 DIAGNOSIS — Z8673 Personal history of transient ischemic attack (TIA), and cerebral infarction without residual deficits: Secondary | ICD-10-CM

## 2018-05-21 DIAGNOSIS — K219 Gastro-esophageal reflux disease without esophagitis: Secondary | ICD-10-CM

## 2018-05-21 DIAGNOSIS — I471 Supraventricular tachycardia: Secondary | ICD-10-CM

## 2018-05-21 DIAGNOSIS — Z940 Kidney transplant status: Secondary | ICD-10-CM

## 2018-05-21 DIAGNOSIS — K922 Gastrointestinal hemorrhage, unspecified: Secondary | ICD-10-CM

## 2018-05-21 DIAGNOSIS — N186 End stage renal disease: Secondary | ICD-10-CM

## 2018-05-21 DIAGNOSIS — I120 Hypertensive chronic kidney disease with stage 5 chronic kidney disease or end stage renal disease: Secondary | ICD-10-CM

## 2018-05-21 DIAGNOSIS — Q2733 Arteriovenous malformation of digestive system vessel: Secondary | ICD-10-CM

## 2018-05-21 DIAGNOSIS — K921 Melena: Secondary | ICD-10-CM

## 2018-05-21 DIAGNOSIS — Z992 Dependence on renal dialysis: Secondary | ICD-10-CM

## 2018-05-21 DIAGNOSIS — K5790 Diverticulosis of intestine, part unspecified, without perforation or abscess without bleeding: Secondary | ICD-10-CM

## 2018-05-21 DIAGNOSIS — D62 Acute posthemorrhagic anemia: Secondary | ICD-10-CM

## 2018-05-21 DIAGNOSIS — Z9889 Other specified postprocedural states: Secondary | ICD-10-CM

## 2018-05-21 DIAGNOSIS — G4700 Insomnia, unspecified: Secondary | ICD-10-CM

## 2018-05-21 DIAGNOSIS — Z886 Allergy status to analgesic agent status: Secondary | ICD-10-CM

## 2018-05-21 LAB — CBC
HCT: 31.6 % — ABNORMAL LOW (ref 36.0–46.0)
HCT: 31 % — ABNORMAL LOW (ref 36.0–46.0)
Hemoglobin: 10.6 g/dL — ABNORMAL LOW (ref 12.0–15.0)
Hemoglobin: 10.7 g/dL — ABNORMAL LOW (ref 12.0–15.0)
MCH: 29.4 pg (ref 26.0–34.0)
MCH: 30 pg (ref 26.0–34.0)
MCHC: 33.5 g/dL (ref 30.0–36.0)
MCHC: 34.5 g/dL (ref 30.0–36.0)
MCV: 87.8 fL (ref 80.0–100.0)
MCV: 86.8 fL (ref 80.0–100.0)
Platelets: 222 10*3/uL (ref 150–400)
Platelets: 224 10*3/uL (ref 150–400)
RBC: 3.6 MIL/uL — ABNORMAL LOW (ref 3.87–5.11)
RBC: 3.57 MIL/uL — ABNORMAL LOW (ref 3.87–5.11)
RDW: 16.2 % — ABNORMAL HIGH (ref 11.5–15.5)
RDW: 16.1 % — ABNORMAL HIGH (ref 11.5–15.5)
WBC: 7.4 10*3/uL (ref 4.0–10.5)
WBC: 7.3 10*3/uL (ref 4.0–10.5)
nRBC: 1.9 % — ABNORMAL HIGH (ref 0.0–0.2)
nRBC: 1.4 % — ABNORMAL HIGH (ref 0.0–0.2)

## 2018-05-21 LAB — BASIC METABOLIC PANEL
Anion gap: 22 — ABNORMAL HIGH (ref 5–15)
BUN: 60 mg/dL — ABNORMAL HIGH (ref 6–20)
CO2: 19 mmol/L — ABNORMAL LOW (ref 22–32)
Calcium: 7.8 mg/dL — ABNORMAL LOW (ref 8.9–10.3)
Chloride: 97 mmol/L — ABNORMAL LOW (ref 98–111)
Creatinine, Ser: 16.07 mg/dL — ABNORMAL HIGH (ref 0.44–1.00)
GFR calc Af Amer: 3 mL/min — ABNORMAL LOW (ref >60)
GFR calc non Af Amer: 2 mL/min — ABNORMAL LOW (ref >60)
Glucose, Bld: 97 mg/dL (ref 70–99)
Potassium: 3.4 mmol/L — ABNORMAL LOW (ref 3.5–5.1)
Sodium: 138 mmol/L (ref 135–145)

## 2018-05-21 LAB — HIV ANTIBODY (ROUTINE TESTING W REFLEX): HIV Screen 4th Generation wRfx: NONREACTIVE

## 2018-05-21 MED ORDER — GENTAMICIN SULFATE 0.1 % EX CREA
1.0000 "application " | TOPICAL_CREAM | Freq: Every day | CUTANEOUS | Status: DC
  Administered 2018-05-21 – 2018-05-23 (×2): 1 via TOPICAL
  Filled 2018-05-21: qty 15

## 2018-05-21 MED ORDER — ALPRAZOLAM 0.5 MG PO TABS
1.0000 mg | ORAL_TABLET | Freq: Once | ORAL | Status: AC
  Administered 2018-05-21: 1 mg via ORAL
  Filled 2018-05-21: qty 2

## 2018-05-21 MED ORDER — ALPRAZOLAM 0.5 MG PO TABS: 1.0000 mg | ORAL_TABLET | Freq: Two times a day (BID) | ORAL | Status: DC | PRN

## 2018-05-21 MED ORDER — HEPARIN 1000 UNIT/ML FOR PERITONEAL DIALYSIS: 500.0000 [IU] | INTRAMUSCULAR | Status: DC | PRN

## 2018-05-21 MED ORDER — PEG 3350-KCL-NA BICARB-NACL 420 G PO SOLR
3000.0000 mL | Freq: Once | ORAL | Status: AC
  Administered 2018-05-21: 3000 mL via ORAL
  Filled 2018-05-21: qty 4000

## 2018-05-21 MED ORDER — HEPARIN 1000 UNIT/ML FOR PERITONEAL DIALYSIS
INTRAPERITONEAL | Status: DC | PRN
  Filled 2018-05-21: qty 3000

## 2018-05-21 MED ORDER — HYDROXYZINE HCL 25 MG PO TABS
25.0000 mg | ORAL_TABLET | Freq: Four times a day (QID) | ORAL | Status: DC | PRN
  Administered 2018-05-21 – 2018-05-22 (×3): 25 mg via ORAL
  Filled 2018-05-21 (×3): qty 1

## 2018-05-21 NOTE — Progress Notes (Signed)
   Subjective: Pt reports some continued dizziness.  SOB has improved.  She denies any bowel movements since admission.    Objective:  Vital signs in last 24 hours: Vitals:   05/21/18 0221 05/21/18 0252 05/21/18 0611 05/21/18 0901  BP: 129/67 133/69 107/75 138/83  Pulse: 68 71 74 70  Resp: $Remo'18 20 18 20  'TpAFy$ Temp: 98.1 F (36.7 C) 98.1 F (36.7 C) 98.1 F (36.7 C) 98 F (36.7 C)  TempSrc: Oral Oral Oral Oral  SpO2: 99% 99% 98% 96%  Weight:      Height:       Cardiac: normal rate and rhythm, clear s1 and s2, no murmurs, rubs or gallops Pulmonary: CTAB, not in distress Abdominal: obese abdomen tender in RLQ>RUQ>epigastric.  No rebound tenderness, no guarding, normal bowel sounds.  Skin is dry and intact, port site bandage dry and intact.   Extremities: no LE edema Psych: Alert, conversant, in good spirits  CBC Latest Ref Rng & Units 05/21/2018 05/20/2018 05/20/2018  WBC 4.0 - 10.5 K/uL 7.4 8.0 11.9(H)  Hemoglobin 12.0 - 15.0 g/dL 10.6(L) 7.5(L) 4.4(LL)  Hematocrit 36.0 - 46.0 % 31.6(L) 23.6(L) 13.6(L)  Platelets 150 - 400 K/uL 222 291 385   Assessment/Plan:  Principal Problem:   GI bleed Active Problems:   ESRD on peritoneal dialysis ([place])   Acute blood loss anemia  GI bleed with acute blood loss anemia: Likely due to known arteriovenous malformations versus diverticulosis versus gastric ulcers.  Good response to transfusions, no more bleeding.  Hgb 10.6 this am --GI will see pt this am - Dilaudid 0.5 to 1 mg every 6 hours PRN pain  ESRD on peritoneal dialysis: - Consult nephrology for inpatient HD  Paradoxical atrial tachycardia/HTN: Heart rate and BP within acceptable limits. -Continuous cardiac monitoring -Hold home metoprolol in the setting of hypotension with active GI bleed  MDD/Insomnia: -Continue home venlafaxine 75 mg daily -Continue home zolpidem 5 mg at bedtime  Dispo: Anticipated discharge in approximately 1-2 day(s).   Katherine Roan, MD 05/21/2018,  10:54 AM

## 2018-05-21 NOTE — Consult Note (Signed)
Referring Provider: Dr. Lalla Brothers Primary Care Physician:  Blair Heys, PA-C Primary Gastroenterologist:  Dr. Rush Landmark   Reason for Consultation:  Anemia   HPI: Patricia [name] is a 43 y.o. female with a history of ESRD on peritoneal dialysis Q HS, anemia, multiple hospital admissions for anemia and GI bleed found to have small bowel non bleeding AVMs and a non bleeding gastric ulcers per EGD, small bowel enteroscopy and small bowel capsule endoscopy 2019-2020 (See GI procedure history below), HTN, GERD, tachycardia, MCGN and hyperparathyroidism. Past hysterectomy. She developed melena and generalized weakness for the past 4 days. She reported passing 4 to 5 black water stools daily with associated generalized abdominal pain then localized RUQ abdominal pain since 05/16/2018. No fever, sweats or chills. She felt light headed yesterday and she contacted Dr. Rush Landmark who advised she present to the ED. In the ED her Hg was 4.4. She received 3 units of PRBCs. Repeat Hg  7.6.  GI history:   06/04/2017 EGD: Patchy moderate inflammation characterized by congestion (edema), erosions and shallow ulcerations found in the prepyloric region of the stomach. The exam of the stomach was otherwise normal. Localized mild inflammation characterized by congestion (edema) and erythema was found in the duodenal bulb.The exam of the duodenum was otherwise normal.   EGD 09/03/2017: A non-obstructing Schatzki ring was found at the gastroesophageal junction.A small hiatal hernia was present. Abnormal motility was noted in the esophagus. Localized mild inflammation characterized by congestion (edema) and erythema was found in the prepyloric region of the stomach. Scattered mild inflammation was found in the duodenal bulb. The second portion of the duodenum was normal.  Colonoscopy 09/04/2017: patch area of moderately congested and erythematous mucosa in the sigmoid, transverse and ascending colon, mucosal  friability in the ascending colon, minimal fresh blood in the cecum, no active bleeding on thorough lavage. Diverticulosis sigmoid and descending colon.  Small bowel capsule endoscopy 11/02/2017: mild gastritis and duodenitis, no source of anemia identified.   12/24/2017: Small bowel endoscopy: Non-bleeding gastric ulcer with a clean ulcer base (Forrest Class III). Non-bleeding erosive gastropathy.- Erythematous duodenopathy in bulb. Normal mucosa was found in the jejunum. Tattooed distal extent to demarcate distal extent of Push SBE.-Successful completion of the Video Capsule Enteroscope placement.  02/11/2018 Small bowel capsule endoscopy: incomplete capsule study, retained capsule, 2-3 non bleeding AVMs found 2hrs and 34 minutes too distal to approach with a regular enteroscope.   02/18/2018 EGD: No gross lesions in esophagus. 3 cm hiatal hernia. Gastroesophageal flap valve classified as Hill Grade IV. Previous ulcer has healed.- No gross lesions in the duodenal bulb, in the first portion of the duodenum, in the second portion of the duodenum, in the third portion of the duodenum and in the fourth portion of the duodenum. Prominent Ampulla. - Normal mucosa was found in the proximal jejunum. Tattooed distal most extent of enteroscopy.  Past Medical History:  Diagnosis Date  . Acid reflux   . Anxiety   . Arthritis   . Dyspnea   . Dysrhythmia    tachycardia  . ESRD (end stage renal disease) ([place])    [facility]- [place]  . GI bleeding 12/2017  . Gout   . Headache(784.0)    "related to chemo; sometimes weekly" (09/12/2013)  . High cholesterol   . History of blood transfusion "a couple"   "related to low counts"  . Hypertension   . Iron deficiency anemia    "get epogen shots q month" (02/20/2014)  . MCGN (minimal change glomerulonephritis)    "  using chemo to tx;  finished my last tx in 12/2013"  . Peritoneal dialysis status ([place])   . Stroke [facility]) 08/15/2017    Past Surgical History:   Procedure Laterality Date  . ABDOMINAL HYSTERECTOMY  2010   "laparoscopic"  . ANKLE FRACTURE SURGERY Right 1994  . AV FISTULA PLACEMENT Left 04/14/2016   Procedure: ARTERIOVENOUS (AV) FISTULA CREATION;  Surgeon: Angelia Mould, MD;  Location: [facility] OR;  Service: Vascular;  Laterality: Left;  . AV FISTULA PLACEMENT Left 08/09/2017   Procedure: INSERTION OF ARTERIOVENOUS (AV) GORE-TEX GRAFT LEFT ARM;  Surgeon: Elam Dutch, MD;  Location: [place];  Service: Vascular;  Laterality: Left;  . [place] REMOVAL Left 08/11/2017   Procedure: REMOVAL OF ARTERIOVENOUS GORETEX GRAFT ([place]);  Surgeon: Serafina Mitchell, MD;  Location: [facility] OR;  Service: Vascular;  Laterality: Left;  . BIOPSY  09/03/2017   Procedure: BIOPSY;  Surgeon: Otis Brace, MD;  Location: [place];  Service: Gastroenterology;;  . BIOPSY  12/24/2017   Procedure: BIOPSY;  Surgeon: Irving Copas., MD;  Location: [place];  Service: Gastroenterology;;  . CARDIAC CATHETERIZATION  2000's  . COLONOSCOPY WITH PROPOFOL N/A 09/04/2017   Procedure: COLONOSCOPY WITH PROPOFOL;  Surgeon: Ronnette Juniper, MD;  Location: [place];  Service: Gastroenterology;  Laterality: N/A;  . ENTEROSCOPY N/A 12/24/2017   Procedure: ENTEROSCOPY;  Surgeon: Rush Landmark Telford Nab., MD;  Location: [place];  Service: Gastroenterology;  Laterality: N/A;  . ESOPHAGOGASTRODUODENOSCOPY    . ESOPHAGOGASTRODUODENOSCOPY (EGD) WITH PROPOFOL Left 06/04/2017   Procedure: ESOPHAGOGASTRODUODENOSCOPY (EGD) WITH PROPOFOL;  Surgeon: Arta Silence, MD;  Location: [place];  Service: Endoscopy;  Laterality: Left;  . ESOPHAGOGASTRODUODENOSCOPY (EGD) WITH PROPOFOL N/A 09/03/2017   Procedure: ESOPHAGOGASTRODUODENOSCOPY (EGD) WITH PROPOFOL;  Surgeon: Otis Brace, MD;  Location: [place];  Service: Gastroenterology;  Laterality: N/A;  . FISTULA SUPERFICIALIZATION Left 04/02/2017   Procedure: FISTULA SUPERFICIALIZATION LEFT ARM;  Surgeon: Serafina Mitchell, MD;  Location: [place];  Service: Vascular;  Laterality: Left;  . FISTULOGRAM Left 04/07/2016   Procedure: FISTULOGRAM;  Surgeon: Waynetta Sandy, MD;  Location: [place];  Service: Vascular;  Laterality: Left;  . FRACTURE SURGERY     right ankle  . GIVENS CAPSULE STUDY N/A 10/29/2017   Procedure: GIVENS CAPSULE STUDY;  Surgeon: Wilford Corner, MD;  Location: [place];  Service: Endoscopy;  Laterality: N/A;  . GIVENS CAPSULE STUDY N/A 12/24/2017   Procedure: GIVENS CAPSULE STUDY;  Surgeon: Irving Copas., MD;  Location: [place];  Service: Gastroenterology;  Laterality: N/A;  . INSERTION OF DIALYSIS CATHETER Right 04/07/2016   Procedure: INSERTION OF DIALYSIS CATHETER;  Surgeon: Waynetta Sandy, MD;  Location: [place];  Service: Vascular;  Laterality: Right;  . INSERTION OF DIALYSIS CATHETER Right 04/04/2017   Procedure: INSERTION OF DIALYSIS CATHETER;  Surgeon: Waynetta Sandy, MD;  Location: [place];  Service: Vascular;  Laterality: Right;  . LIGATION OF ARTERIOVENOUS  FISTULA Left 04/14/2016   Procedure: LIGATION OF ARTERIOVENOUS  FISTULA;  Surgeon: Angelia Mould, MD;  Location: [place];  Service: Vascular;  Laterality: Left;  . PATCH ANGIOPLASTY Left 06/19/2016   Procedure: PATCH ANGIOPLASTY LEFT ARTERIOVENOUS FISTULA;  Surgeon: Angelia Mould, MD;  Location: [place];  Service: Vascular;  Laterality: Left;  . POLYPECTOMY  09/04/2017   Procedure: POLYPECTOMY;  Surgeon: Ronnette Juniper, MD;  Location: [facility] ENDOSCOPY;  Service: Gastroenterology;;  . REVISON OF ARTERIOVENOUS FISTULA Left 06/19/2016   Procedure: REVISION SUPERFICIALIZATION OF BRACHIOCEPHALIC ARTERIOVENOUS FISTULA;  Surgeon: Scot Dock,  Judeth Cornfield, MD;  Location: [facility] OR;  Service: Vascular;  Laterality: Left;    Prior to Admission medications   Medication Sig Start Date End Date Taking? Authorizing Provider  allopurinol (ZYLOPRIM) 100 MG tablet Take 100 mg by mouth at bedtime.    Yes  [provider]  ALPRAZolam Duanne Moron) 1 MG tablet Take 1 mg by mouth 2 (two) times daily as needed for anxiety.  03/19/17  Yes [provider]  atorvastatin (LIPITOR) 40 MG tablet Take 40 mg by mouth at bedtime.  03/14/14  Yes [provider]  calcitRIOL (ROCALTROL) 0.5 MCG capsule Take 1 capsule (0.5 mcg total) by mouth daily with breakfast. Patient taking differently: Take 0.5 mcg by mouth at bedtime.  06/04/17  Yes Bonnielee Haff, MD  gentamicin ointment (GARAMYCIN) 0.1 % Apply 1 application topically daily as needed (changing dressing.).   Yes [provider]  hydrOXYzine (ATARAX/VISTARIL) 25 MG tablet Take 25 mg by mouth every 6 (six) hours as needed for itching.  01/25/18  Yes [provider]  metoprolol tartrate (LOPRESSOR) 25 MG tablet Take 25 mg by mouth at bedtime.  04/17/16  Yes [provider]  multivitamin (RENA-VIT) TABS tablet Take 1 tablet by mouth at bedtime.    Yes [provider]  omeprazole (PRILOSEC) 40 MG capsule Take 40 mg by mouth 2 (two) times daily.   Yes [provider]  prochlorperazine (COMPAZINE) 10 MG tablet Take 1 tablet (10 mg total) by mouth every 8 (eight) hours as needed for nausea or vomiting. 02/17/18  Yes Mansouraty, Telford Nab., MD  promethazine (PHENERGAN) 25 MG tablet Take 1 tablet (25 mg total) by mouth every 8 (eight) hours as needed for nausea or vomiting. 04/07/18  Yes Enid Derry, [place], DO  sucroferric oxyhydroxide (VELPHORO) 500 MG chewable tablet Chew 1,000 mg by mouth 3 (three) times daily before meals.    Yes [provider]  venlafaxine XR (EFFEXOR-XR) 75 MG 24 hr capsule Take 75 mg by mouth at bedtime.    Yes [provider]  zolpidem (AMBIEN) 5 MG tablet Take 5 mg by mouth at bedtime. 05/17/18 05/17/19 Yes [provider]    Current Facility-Administered Medications  Medication Dose Route Frequency Provider Last Rate Last Dose  . 0.9 %  sodium chloride infusion  (Manually program via Guardrails IV Fluids)   Intravenous Once Jola Schmidt, MD      . acetaminophen (TYLENOL) tablet 650 mg  650 mg Oral Q6H PRN Chundi, Vahini, MD       Or  . acetaminophen (TYLENOL) suppository 650 mg  650 mg Rectal Q6H PRN Chundi, Vahini, MD      . allopurinol (ZYLOPRIM) tablet 100 mg  100 mg Oral QHS Chundi, Vahini, MD   100 mg at 05/20/18 2229  . atorvastatin (LIPITOR) tablet 40 mg  40 mg Oral QHS Chundi, Vahini, MD   40 mg at 05/20/18 2229  . calcitRIOL (ROCALTROL) capsule 0.5 mcg  0.5 mcg Oral Q breakfast Chundi, Vahini, MD      . gentamicin cream (GARAMYCIN) 0.1 % 1 application  1 application Topical Daily Ejigiri, Thomos Lemons, PA-C      . heparin 1000 unit/ml injection 500 Units  500 Units Intraperitoneal PRN Lynnda Child, PA-C      . HYDROmorphone (DILAUDID) injection 0.5-1 mg  0.5-1 mg Intravenous Q6H PRN Carroll Sage, MD   1 mg at 05/21/18 0751  . hydrOXYzine (ATARAX/VISTARIL) tablet 25 mg  25 mg Oral Q6H PRN Guadlupe Spanish  B, MD   25 mg at 05/21/18 1026  . pantoprazole (PROTONIX) 80 mg in sodium chloride 0.9 % 250 mL (0.32 mg/mL) infusion  8 mg/hr Intravenous Continuous Jola Schmidt, MD 25 mL/hr at 05/20/18 2118 8 mg/hr at 05/20/18 2118  . [START ON 05/24/2018] pantoprazole (PROTONIX) injection 40 mg  40 mg Intravenous Q12H Jola Schmidt, MD      . promethazine (PHENERGAN) tablet 12.5 mg  12.5 mg Oral Q6H PRN Chundi, Vahini, MD   12.5 mg at 05/21/18 0829  . senna-docusate (Senokot-S) tablet 1 tablet  1 tablet Oral QHS PRN Chundi, Vahini, MD      . sucroferric oxyhydroxide (VELPHORO) chewable tablet 1,000 mg  1,000 mg Oral TID AC Chundi, Vahini, MD      . venlafaxine XR (EFFEXOR-XR) 24 hr capsule 75 mg  75 mg Oral QHS Chundi, Vahini, MD   75 mg at 05/20/18 2229  . zolpidem (AMBIEN) tablet 5 mg  5 mg Oral QHS Chundi, Vahini, MD   5 mg at 05/20/18 2229    Allergies as of 05/20/2018 - Review Complete 05/20/2018  Allergen Reaction Noted  . Nsaids Other  (See Comments) 01/27/2013  . Tolmetin Other (See Comments) 07/20/2015    Family History  Problem Relation Age of Onset  . Hypertension Mother   . Thyroid disease Mother   . Coronary artery disease Father   . Hypertension Father   . Diabetes Father   . Colon cancer Maternal Aunt   . Esophageal cancer Maternal Uncle   . Inflammatory bowel disease Neg Hx   . Liver disease Neg Hx   . Pancreatic cancer Neg Hx   . Rectal cancer Neg Hx   . Stomach cancer Neg Hx     Social History   Socioeconomic History  . Marital status: Single    Spouse name: Not on file  . Number of children: 3  . Years of education: 20  . Highest education level: Associate degree: academic program  Occupational History  . Occupation: disabled  Social Needs  . Financial resource strain: Not hard at all  . Food insecurity:    Worry: Sometimes true    Inability: Never true  . Transportation needs:    Medical: No    Non-medical: No  Tobacco Use  . Smoking status: Never Smoker  . Smokeless tobacco: Never Used  Substance and Sexual Activity  . Alcohol use: No  . Drug use: No  . Sexual activity: Not Currently    Birth control/protection: Surgical  Lifestyle  . Physical activity:    Days per week: 0 days    Minutes per session: 0 min  . Stress: Only a little  Relationships  . Social connections:    Talks on phone: More than three times a week    Gets together: More than three times a week    Attends religious service: More than 4 times per year    Active member of club or organization: No    Attends meetings of clubs or organizations: Never    Relationship status: Never married  . Intimate partner violence:    Fear of current or ex partner: No    Emotionally abused: No    Physically abused: No    Forced sexual activity: No  Other Topics Concern  . Not on file  Social History Narrative  . Not on file    Review of Systems: Gen: weight loss, fever, sweats or chills CV: Denies chest pain or  palpitations. Resp: Denies  cough or SOB. GI:  See HPI. Denies vomiting blood, jaundice, and fecal incontinence.   Denies dysphagia or odynophagia. GU : Denies urinary burning or hematuria. MS: Denies joint pain or muscle aches. Derm: Denies rash or skin lesions.  Psych: Denies depression or anxiety.  Heme: Denies bruising, bleeding, and enlarged lymph nodes. Neuro:  Denies any headaches, + dizziness.   Physical Exam: Vital signs in last 24 hours: Temp:  [97.6 F (36.4 C)-98.7 F (37.1 C)] 98 F (36.7 C) (05/16 0901) Pulse Rate:  [68-85] 70 (05/16 0901) Resp:  [16-24] 20 (05/16 0901) BP: (107-148)/(52-87) 138/83 (05/16 0901) SpO2:  [96 %-100 %] 96 % (05/16 0901) Weight:  [122.1 kg] 122.1 kg (05/15 2147) Last BM Date: 05/19/18 General:   Alert,  Well-developed, well-nourished, pleasant and cooperative in NAD Head:  Normocephalic and atraumatic. Eyes:  Sclera clear, no icterus.Conjunctiva pink. Ears:  Normal auditory acuity. Nose:  No deformity, discharge,  or lesions. Mouth:  No deformity or lesions.   Neck:  Supple. Lungs:  Clear throughout to auscultation. No wheezes, crackles, or rhonchi. Heart:  Regular rate and rhythm; no murmurs, clicks, rubs,  or gallops. Abdomen:  Soft, nondistended, mild generalized tenderness throughout > RUQ without rebound or guarding.  Rectal:  Deferred  Msk:  Symmetrical without gross deformities. . Pulses:  Normal pulses noted. Extremities:  Without clubbing or edema. Neurologic:  Alert and  oriented x4;  grossly normal neurologically. Skin:  Intact without significant lesions or rashes.. Psych:  Alert and cooperative. Normal mood and affect.  Intake/Output from previous day: 05/15 0701 - 05/16 0700 In: 1267.5 [P.O.:180; I.V.:217.5; Blood:770; IV Piggyback:100] Out: 0  Intake/Output this shift: Total I/O In: 60 [P.O.:60] Out: -   Lab Results: Recent Labs    05/20/18 1830 05/20/18 2006 05/21/18 0728  WBC 11.9* 8.0 7.4  HGB 4.4* 7.5*  10.6*  HCT 13.6* 23.6* 31.6*  PLT 385 291 222   BMET Recent Labs    05/20/18 1830 05/20/18 2006 05/21/18 0728  NA 137  --  138  K 3.1*  --  3.4*  CL 97*  --  97*  CO2 21*  --  19*  GLUCOSE 124*  --  97  BUN 54*  --  60*  CREATININE 15.37* 15.78* 16.07*  CALCIUM 7.8*  --  7.8*   LFT Recent Labs    05/20/18 1830  PROT 6.5  ALBUMIN 3.4*  AST 28  ALT 18  ALKPHOS 75  BILITOT 0.5    IMPRESSION/PLAN:   1. 43 y.o. female with ESRD admitted with symptomatic anemia, melena and abdominal pain. Hx of GI bleed unclear etiology, numerous EGDs, small bowel enteroscopy and small bowel capsule endoscopies showed small bowel AVMs and gastric ulcer.  Hg 4.4 on admission. Received 3 units of PRBCs. Hg up to 10.6. HCT 31.6.  -clear liquids -NPO after midnight -Small Bowel Capsule endoscopy to be placed in Endo Sunday 05/22/2018 by Dr. Rush Landmark. Patient to have Movi prep today. May require Retrograde enteroscopy at [facility] -will need nephrology input for fluid intake prior to starting bowel prep -pain management per hospitalist   2. CKD on peritoneal dialysis. Cr. 16.07. BUN 60.  Patient previously on 36 ounce fluid restriction.   Patrecia Pour Kennedy-Smith  05/21/2018, 2:31 PM

## 2018-05-21 NOTE — Consult Note (Addendum)
[facility] Renal Consultation Note    Indication for Consultation:  Management of ESRD/hemodialysis; anemia, hypertension/volume and secondary hyperparathyroidism  HPI: Patricia [name] is a 43 y.o. 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 On CCPD since 05/2017. Marland Kitchen   PMH significant for recurrent hospital admissions for GI bleeding/melena/symptomatic anemia.  Ongoing GI workup since May 2019.   Known small bowel AVMs.   Admitted now with symptomatic anemia. Hgb 4.4 on admission. Received 3 units prbcs. Hgb now improved to 10.6. GI consulted. Last outpatient Hgb 8.2 on 05/13/18.   Seen and examined at bedside. Reports 1 week of dark, tarry stools. Yesterday feeling increasingly SOB and weak so presented to ED. Also endorses abdominal pain x 2-3 days. Worse in RLQ.   CCPD 7days/week. Missed yesterday d/t hospital admission. No issues with PD recently. Denies fevers, chills, cloudy effluent.   Past Medical History:  Diagnosis Date  . Acid reflux   . Anxiety   . Arthritis   . Dyspnea   . Dysrhythmia    tachycardia  . ESRD (end stage renal disease) ([place])    [place]THSAT- [place]  . GI bleeding 12/2017  . Gout   . Headache(784.0)    "related to chemo; sometimes weekly" (09/12/2013)  . High cholesterol   . History of blood transfusion "a couple"   "related to low counts"  . Hypertension   . Iron deficiency anemia    "get epogen shots q month" (02/20/2014)  . 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 Peritoneal dialysis status ([place])   . Stroke [facility]) 08/15/2017   Past Surgical History:  Procedure Laterality Date  . ABDOMINAL HYSTERECTOMY  2010   "laparoscopic"  . ANKLE FRACTURE SURGERY Right 1994  . AV FISTULA PLACEMENT Left 04/14/2016   Procedure: ARTERIOVENOUS (AV) FISTULA CREATION;  Surgeon: Angelia Mould, MD;  Location: [facility] OR;  Service: Vascular;  Laterality: Left;  . AV FISTULA PLACEMENT Left 08/09/2017   Procedure:  INSERTION OF ARTERIOVENOUS (AV) GORE-TEX GRAFT LEFT ARM;  Surgeon: Elam Dutch, MD;  Location: [place];  Service: Vascular;  Laterality: Left;  . [place] REMOVAL Left 08/11/2017   Procedure: REMOVAL OF ARTERIOVENOUS GORETEX GRAFT ([place]);  Surgeon: Serafina Mitchell, MD;  Location: [facility] OR;  Service: Vascular;  Laterality: Left;  . BIOPSY  09/03/2017   Procedure: BIOPSY;  Surgeon: Otis Brace, MD;  Location: [place];  Service: Gastroenterology;;  . BIOPSY  12/24/2017   Procedure: BIOPSY;  Surgeon: Irving Copas., MD;  Location: [place];  Service: Gastroenterology;;  . CARDIAC CATHETERIZATION  2000's  . COLONOSCOPY WITH PROPOFOL N/A 09/04/2017   Procedure: COLONOSCOPY WITH PROPOFOL;  Surgeon: Ronnette Juniper, MD;  Location: [place];  Service: Gastroenterology;  Laterality: N/A;  . ENTEROSCOPY N/A 12/24/2017   Procedure: ENTEROSCOPY;  Surgeon: Rush Landmark Telford Nab., MD;  Location: [place];  Service: Gastroenterology;  Laterality: N/A;  . ESOPHAGOGASTRODUODENOSCOPY    . ESOPHAGOGASTRODUODENOSCOPY (EGD) WITH PROPOFOL Left 06/04/2017   Procedure: ESOPHAGOGASTRODUODENOSCOPY (EGD) WITH PROPOFOL;  Surgeon: Arta Silence, MD;  Location: [place];  Service: Endoscopy;  Laterality: Left;  . ESOPHAGOGASTRODUODENOSCOPY (EGD) WITH PROPOFOL N/A 09/03/2017   Procedure: ESOPHAGOGASTRODUODENOSCOPY (EGD) WITH PROPOFOL;  Surgeon: Otis Brace, MD;  Location: [place];  Service: Gastroenterology;  Laterality: N/A;  . FISTULA SUPERFICIALIZATION Left 04/02/2017   Procedure: FISTULA SUPERFICIALIZATION LEFT ARM;  Surgeon: Serafina Mitchell, MD;  Location: [place];  Service: Vascular;  Laterality: Left;  . FISTULOGRAM  Left 04/07/2016   Procedure: FISTULOGRAM;  Surgeon: Waynetta Sandy, MD;  Location: [place];  Service: Vascular;  Laterality: Left;  . FRACTURE SURGERY     right ankle  . GIVENS CAPSULE STUDY N/A 10/29/2017   Procedure: GIVENS CAPSULE STUDY;  Surgeon: Wilford Corner, MD;  Location: [place];  Service: Endoscopy;  Laterality: N/A;  . GIVENS CAPSULE STUDY N/A 12/24/2017   Procedure: GIVENS CAPSULE STUDY;  Surgeon: Irving Copas., MD;  Location: [place];  Service: Gastroenterology;  Laterality: N/A;  . INSERTION OF DIALYSIS CATHETER Right 04/07/2016   Procedure: INSERTION OF DIALYSIS CATHETER;  Surgeon: Waynetta Sandy, MD;  Location: [place];  Service: Vascular;  Laterality: Right;  . INSERTION OF DIALYSIS CATHETER Right 04/04/2017   Procedure: INSERTION OF DIALYSIS CATHETER;  Surgeon: Waynetta Sandy, MD;  Location: [place];  Service: Vascular;  Laterality: Right;  . LIGATION OF ARTERIOVENOUS  FISTULA Left 04/14/2016   Procedure: LIGATION OF ARTERIOVENOUS  FISTULA;  Surgeon: Angelia Mould, MD;  Location: [place];  Service: Vascular;  Laterality: Left;  . PATCH ANGIOPLASTY Left 06/19/2016   Procedure: PATCH ANGIOPLASTY LEFT ARTERIOVENOUS FISTULA;  Surgeon: Angelia Mould, MD;  Location: [place];  Service: Vascular;  Laterality: Left;  . POLYPECTOMY  09/04/2017   Procedure: POLYPECTOMY;  Surgeon: Ronnette Juniper, MD;  Location: [facility] ENDOSCOPY;  Service: Gastroenterology;;  . REVISON OF ARTERIOVENOUS FISTULA Left 06/19/2016   Procedure: REVISION SUPERFICIALIZATION OF BRACHIOCEPHALIC ARTERIOVENOUS FISTULA;  Surgeon: Angelia Mould, MD;  Location: [facility] OR;  Service: Vascular;  Laterality: Left;   Family History  Problem Relation Age of Onset  . Hypertension Mother   . Thyroid disease Mother   . Coronary artery disease Father   . Hypertension Father   . Diabetes Father   . Colon cancer Maternal Aunt   . Esophageal cancer Maternal Uncle   . Inflammatory bowel disease Neg Hx   . Liver disease Neg Hx   . Pancreatic cancer Neg Hx   . Rectal cancer Neg Hx   . Stomach cancer Neg Hx    Social History:  reports that she has never smoked. She has never used smokeless tobacco. She reports that she does not drink  alcohol or use drugs. Allergies  Allergen Reactions  . Nsaids Other (See Comments)    Cannot take due to Kidney disease/Kidney function   . Tolmetin Other (See Comments)    Cannot take due to Kidney Disease   Prior to Admission medications   Medication Sig Start Date End Date Taking? Authorizing Provider  allopurinol (ZYLOPRIM) 100 MG tablet Take 100 mg by mouth at bedtime.    Yes [provider]  ALPRAZolam Duanne Moron) 1 MG tablet Take 1 mg by mouth 2 (two) times daily as needed for anxiety.  03/19/17  Yes [provider]  atorvastatin (LIPITOR) 40 MG tablet Take 40 mg by mouth at bedtime.  03/14/14  Yes [provider]  calcitRIOL (ROCALTROL) 0.5 MCG capsule Take 1 capsule (0.5 mcg total) by mouth daily with breakfast. Patient taking differently: Take 0.5 mcg by mouth at bedtime.  06/04/17  Yes Bonnielee Haff, MD  gentamicin ointment (GARAMYCIN) 0.1 % Apply 1 application topically daily as needed (changing dressing.).   Yes [provider]  hydrOXYzine (ATARAX/VISTARIL) 25 MG tablet Take 25 mg by mouth every 6 (six) hours as needed for itching.  01/25/18  Yes [provider]  metoprolol tartrate (LOPRESSOR) 25 MG tablet Take 25 mg by mouth at bedtime.  04/17/16  Yes [provider]  multivitamin (RENA-VIT) TABS tablet Take 1 tablet by mouth at bedtime.    Yes [provider]  omeprazole (PRILOSEC) 40 MG capsule Take 40 mg by mouth 2 (two) times daily.   Yes [provider]  prochlorperazine (COMPAZINE) 10 MG tablet Take 1 tablet (10 mg total) by mouth every 8 (eight) hours as needed for nausea or vomiting. 02/17/18  Yes Mansouraty, Telford Nab., MD  promethazine (PHENERGAN) 25 MG tablet Take 1 tablet (25 mg total) by mouth every 8 (eight) hours as needed for nausea or vomiting. 04/07/18  Yes Enid Derry, [place], DO  sucroferric oxyhydroxide (VELPHORO) 500 MG chewable tablet Chew 1,000 mg by mouth 3 (three) times daily before meals.     Yes [provider]  venlafaxine XR (EFFEXOR-XR) 75 MG 24 hr capsule Take 75 mg by mouth at bedtime.    Yes [provider]  zolpidem (AMBIEN) 5 MG tablet Take 5 mg by mouth at bedtime. 05/17/18 05/17/19 Yes [provider]   Current Facility-Administered Medications  Medication Dose Route Frequency Provider Last Rate Last Dose  . 0.9 %  sodium chloride infusion (Manually program via Guardrails IV Fluids)   Intravenous Once Jola Schmidt, MD      . acetaminophen (TYLENOL) tablet 650 mg  650 mg Oral Q6H PRN Chundi, Vahini, MD       Or  . acetaminophen (TYLENOL) suppository 650 mg  650 mg Rectal Q6H PRN Chundi, Vahini, MD      . allopurinol (ZYLOPRIM) tablet 100 mg  100 mg Oral QHS Chundi, Vahini, MD   100 mg at 05/20/18 2229  . atorvastatin (LIPITOR) tablet 40 mg  40 mg Oral QHS Chundi, Vahini, MD   40 mg at 05/20/18 2229  . calcitRIOL (ROCALTROL) capsule 0.5 mcg  0.5 mcg Oral Q breakfast Chundi, Vahini, MD      . HYDROmorphone (DILAUDID) injection 0.5-1 mg  0.5-1 mg Intravenous Q6H PRN Carroll Sage, MD   1 mg at 05/21/18 0751  . hydrOXYzine (ATARAX/VISTARIL) tablet 25 mg  25 mg Oral Q6H PRN Katherine Roan, MD   25 mg at 05/21/18 1026  . pantoprazole (PROTONIX) 80 mg in sodium chloride 0.9 % 250 mL (0.32 mg/mL) infusion  8 mg/hr Intravenous Continuous Jola Schmidt, MD 25 mL/hr at 05/20/18 2118 8 mg/hr at 05/20/18 2118  . [START ON 05/24/2018] pantoprazole (PROTONIX) injection 40 mg  40 mg Intravenous Q12H Jola Schmidt, MD      . promethazine (PHENERGAN) tablet 12.5 mg  12.5 mg Oral Q6H PRN Chundi, Vahini, MD   12.5 mg at 05/21/18 0829  . senna-docusate (Senokot-S) tablet 1 tablet  1 tablet Oral QHS PRN Chundi, Vahini, MD      . sucroferric oxyhydroxide (VELPHORO) chewable tablet 1,000 mg  1,000 mg Oral TID AC Chundi, Vahini, MD      . venlafaxine XR (EFFEXOR-XR) 24 hr capsule 75 mg  75 mg Oral QHS Chundi, Vahini, MD   75 mg at 05/20/18 2229  . zolpidem (AMBIEN)  tablet 5 mg  5 mg Oral QHS Chundi, Vahini, MD   5 mg at 05/20/18 2229     ROS: As per HPI otherwise negative.  Physical Exam: Vitals:   05/21/18 0221 05/21/18 0252 05/21/18 0611 05/21/18 0901  BP: 129/67 133/69 107/75 138/83  Pulse: 68 71 74 70  Resp: $Remo'18 20 18 20  'MbcTq$ Temp: 98.1 F (36.7 C) 98.1 F (36.7 C) 98.1 F (36.7 C) 98 F (36.7 C)  TempSrc: Oral Oral Oral Oral  SpO2: 99% 99% 98% 96%  Weight:      Height:         General: Ill appearing obese female NAD  Head: NCAT sclera not icteric MMM Neck: Supple. No JVD  Lungs: CTA bilaterally without wheezes, rales, or rhonchi. Breathing is unlabored. Heart: RRR with S1 S2 Abdomen: soft obese; diffuse tenderness to palpation, worse RLQ; PD cath in LUQ Lower extremities:without edema or ischemic changes, no open wounds  Neuro: A & O  X 3. Moves all extremities spontaneously. Psych:  Responds to questions appropriately with a normal affect. Dialysis Access: PD cath   Labs: Basic Metabolic Panel: Recent Labs  Lab 05/20/18 1830 05/20/18 2006 05/21/18 0728  NA 137  --  138  K 3.1*  --  3.4*  CL 97*  --  97*  CO2 21*  --  19*  GLUCOSE 124*  --  97  BUN 54*  --  60*  CREATININE 15.37* 15.78* 16.07*  CALCIUM 7.8*  --  7.8*   Liver Function Tests: Recent Labs  Lab 05/20/18 1830  AST 28  ALT 18  ALKPHOS 75  BILITOT 0.5  PROT 6.5  ALBUMIN 3.4*   No results for input(s): LIPASE, AMYLASE in the last 168 hours. No results for input(s): AMMONIA in the last 168 hours. CBC: Recent Labs  Lab 05/20/18 1830 05/20/18 2006 05/21/18 0728  WBC 11.9* 8.0 7.4  HGB 4.4* 7.5* 10.6*  HCT 13.6* 23.6* 31.6*  MCV 93.2 90.4 87.8  PLT 385 291 222   Cardiac Enzymes: No results for input(s): CKTOTAL, CKMB, CKMBINDEX, TROPONINI in the last 168 hours. CBG: No results for input(s): GLUCAP in the last 168 hours. Iron Studies: No results for input(s): IRON, TIBC, TRANSFERRIN, FERRITIN in the last 72 hours. Studies/Results: No results  found.  Dialysis Orders:  CCPD 7 days/ week EDW 108.5 kg  5 cycler exchanges 3L 2 hr dwell time last fill 3L 1 day exchange 3L 4 hr dwell time ( 6 exchanges in total)  Assessment/Plan: 1. Symptomatic anemia/melena. Recurrent GIB with known AVMs. Extensive GI workup. Hgb 4.4 on admission. Now improved to 10.6 s/p 3 units prbcs. GI consulted.  2. ESRD -  Continue CCPD. Reports new abdominal pain this week. Hx of peritonitis. Will check PD fluid/culture while here.  3. Hypertension/volume  - BP ok. No gross volume on exam  Use 2.5% sol.  4. Anemia  - As above. Has been getting Mircera 225 q week for last 3 weeks at OP unit. Last dose 4/33.  5. Metabolic bone disease -  Corr Ca ok. On Velphoro binder. Calcitriol 0.5 qd.   Lynnda Child PA-C [facility] Pager [phone] 05/21/2018, 12:24 PM

## 2018-05-22 ENCOUNTER — Encounter (HOSPITAL_COMMUNITY): Payer: Self-pay | Admitting: Anesthesiology

## 2018-05-22 ENCOUNTER — Encounter (HOSPITAL_COMMUNITY)
Admission: EM | Disposition: A | Discharge status: Home / Self Care | Payer: Self-pay | Source: Home / Self Care | Attending: Student in an Organized Health Care Education/Training Program

## 2018-05-22 HISTORY — PX: GIVENS CAPSULE STUDY: SHX5432

## 2018-05-22 LAB — BPAM RBC
Blood Product Expiration Date: 202005192359
Blood Product Expiration Date: 202005192359
Blood Product Expiration Date: 202005212359
ISSUE DATE / TIME: 202005152043
ISSUE DATE / TIME: 202005152326
ISSUE DATE / TIME: 202005160230
Unit Type and Rh: 9500
Unit Type and Rh: 9500
Unit Type and Rh: 9500

## 2018-05-22 LAB — CBC
HCT: 29.4 % — ABNORMAL LOW (ref 36.0–46.0)
Hemoglobin: 9.8 g/dL — ABNORMAL LOW (ref 12.0–15.0)
MCH: 29.1 pg (ref 26.0–34.0)
MCHC: 33.3 g/dL (ref 30.0–36.0)
MCV: 87.2 fL (ref 80.0–100.0)
Platelets: 224 10*3/uL (ref 150–400)
RBC: 3.37 MIL/uL — ABNORMAL LOW (ref 3.87–5.11)
RDW: 16.2 % — ABNORMAL HIGH (ref 11.5–15.5)
WBC: 8.4 10*3/uL (ref 4.0–10.5)
nRBC: 1.2 % — ABNORMAL HIGH (ref 0.0–0.2)

## 2018-05-22 LAB — TYPE AND SCREEN
ABO/RH(D): O NEG
Antibody Screen: NEGATIVE
Unit division: 0
Unit division: 0
Unit division: 0

## 2018-05-22 LAB — BASIC METABOLIC PANEL
Anion gap: 22 — ABNORMAL HIGH (ref 5–15)
BUN: 67 mg/dL — ABNORMAL HIGH (ref 6–20)
CO2: 18 mmol/L — ABNORMAL LOW (ref 22–32)
Calcium: 8 mg/dL — ABNORMAL LOW (ref 8.9–10.3)
Chloride: 96 mmol/L — ABNORMAL LOW (ref 98–111)
Creatinine, Ser: 16.86 mg/dL — ABNORMAL HIGH (ref 0.44–1.00)
GFR calc Af Amer: 3 mL/min — ABNORMAL LOW (ref >60)
GFR calc non Af Amer: 2 mL/min — ABNORMAL LOW (ref >60)
Glucose, Bld: 87 mg/dL (ref 70–99)
Potassium: 3.5 mmol/L (ref 3.5–5.1)
Sodium: 136 mmol/L (ref 135–145)

## 2018-05-22 SURGERY — IMAGING PROCEDURE, GI TRACT, INTRALUMINAL, VIA CAPSULE

## 2018-05-22 MED ORDER — POLYETHYLENE GLYCOL 3350 17 G PO PACK
51.0000 g | PACK | Freq: Every day | ORAL | Status: DC
  Administered 2018-05-22: 51 g via ORAL
  Filled 2018-05-22 (×2): qty 3

## 2018-05-22 MED ORDER — BISACODYL 5 MG PO TBEC
10.0000 mg | DELAYED_RELEASE_TABLET | Freq: Once | ORAL | Status: AC
  Administered 2018-05-22: 10 mg via ORAL
  Filled 2018-05-22: qty 2

## 2018-05-22 MED ORDER — DIPHENHYDRAMINE HCL 25 MG PO CAPS
25.0000 mg | ORAL_CAPSULE | Freq: Once | ORAL | Status: AC
  Administered 2018-05-22: 25 mg via ORAL
  Filled 2018-05-22: qty 1

## 2018-05-22 NOTE — Plan of Care (Signed)
  Problem: Clinical Measurements: Goal: Ability to maintain clinical measurements within normal limits will improve Outcome: Progressing   Problem: Coping: Goal: Level of anxiety will decrease Outcome: Progressing   Problem: Safety: Goal: Ability to remain free from injury will improve Outcome: Progressing   

## 2018-05-22 NOTE — Progress Notes (Signed)
Subjective:  Plan is for capsule endoscopy today - PD disrupted overnight due to bowel prep- just wanting to get this issue settled    Objective Vital signs in last 24 hours: Vitals:   05/22/18 0030 05/22/18 0033 05/22/18 0037 05/22/18 0447  BP:   (!) 155/79 (!) 142/71  Pulse:   70 77  Resp:   18 18  Temp:  97.6 F (36.4 C)  98.2 F (36.8 C)  TempSrc:  Oral    SpO2:   99% 98%  Weight: (!) 140 kg     Height:       Weight change: -1.408 kg  Intake/Output Summary (Last 24 hours) at 05/22/2018 0855 Last data filed at 05/22/2018 0447 Gross per 24 hour  Intake 3177.39 ml  Output 600 ml  Net 2577.39 ml   Dialysis Orders:  CCPD 7 days/ week EDW 108.5 kg  5 cycler exchanges 3L 2 hr dwell time last fill 3L 1 day exchange 3L 4 hr dwell time ( 6 exchanges in total)   Assessment/ Plan: Pt is a 43 y.o. yo female with ESRD on PD who was admitted on 05/20/2018 with symptomatic GIB  1. Symptomatic anemia/melena. Recurrent GIB with known AVMs. Extensive GI workup. Hgb 4.4 on admission. Now improved to 10.6 s/p 3 units prbcs. GI consulted.  Capsule endoscopy today- pt reports possible transfer to [facility] as bleeding in a difficult spot to get to  2. ESRD -  Continue CCPD. Reports new abdominal pain this week. Hx of peritonitis. Will check PD fluid/culture while here. No fluid obtained yet, will re order and resume PD today  3. Hypertension/volume  - BP higher. Difficult to tell volume status due to obesity- has not had full treatment for 2 days-   Use 2.5% sol.  4. Anemia  - As above. Has been getting Mircera 225 q week for last 3 weeks at OP unit. Last dose 5/15. Transfused, current hgb over 10 5. Metabolic bone disease -  Corr Ca ok. On Velphoro binder. Calcitriol 0.5 qd.     Louis Meckel    Labs: Basic Metabolic Panel: Recent Labs  Lab 05/20/18 1830 05/20/18 2006 05/21/18 0728  NA 137  --  138  K 3.1*  --  3.4*  CL 97*  --  97*  CO2 21*  --  19*  GLUCOSE 124*  --  97  BUN  54*  --  60*  CREATININE 15.37* 15.78* 16.07*  CALCIUM 7.8*  --  7.8*   Liver Function Tests: Recent Labs  Lab 05/20/18 1830  AST 28  ALT 18  ALKPHOS 75  BILITOT 0.5  PROT 6.5  ALBUMIN 3.4*   No results for input(s): LIPASE, AMYLASE in the last 168 hours. No results for input(s): AMMONIA in the last 168 hours. CBC: Recent Labs  Lab 05/20/18 1830 05/20/18 2006 05/21/18 0728 05/21/18 2002 05/22/18 0759  WBC 11.9* 8.0 7.4 7.3 8.4  HGB 4.4* 7.5* 10.6* 10.7* 9.8*  HCT 13.6* 23.6* 31.6* 31.0* 29.4*  MCV 93.2 90.4 87.8 86.8 87.2  PLT 385 291 222 224 224   Cardiac Enzymes: No results for input(s): CKTOTAL, CKMB, CKMBINDEX, TROPONINI in the last 168 hours. CBG: No results for input(s): GLUCAP in the last 168 hours.  Iron Studies: No results for input(s): IRON, TIBC, TRANSFERRIN, FERRITIN in the last 72 hours. Studies/Results: No results found. Medications: Infusions: . pantoprozole (PROTONIX) infusion 8 mg/hr (05/22/18 0711)    Scheduled Medications: . sodium chloride   Intravenous Once  .  allopurinol  100 mg Oral QHS  . atorvastatin  40 mg Oral QHS  . calcitRIOL  0.5 mcg Oral Q breakfast  . gentamicin cream  1 application Topical Daily  . [START ON 05/24/2018] pantoprazole  40 mg Intravenous Q12H  . polyethylene glycol  51 g Oral Daily  . sucroferric oxyhydroxide  1,000 mg Oral TID AC  . venlafaxine XR  75 mg Oral QHS  . zolpidem  5 mg Oral QHS    have reviewed scheduled and prn medications.  Physical Exam: General: obese, pleasant, NAD Heart: RRR Lungs: mostly clear Abdomen: soft, non tender Extremities: min edema  Dialysis Access: PD cath     05/22/2018,8:55 AM  LOS: 2 days

## 2018-05-22 NOTE — Progress Notes (Signed)
   Subjective: Ms. Tofte still has some abdominal pain. Had 2 episodes of vomitings last night. She mentions that she did not pay attention to the content to see if it was bloody or not. Had one non bloody brown-green stool. She had dizziness and lightheadedness last night that is better now. RN is in the room and preparing for video endoscopy process.  Objective:  Vital signs in last 24 hours: Vitals:   05/22/18 0030 05/22/18 0033 05/22/18 0037 05/22/18 0447  BP:   (!) 155/79 (!) 142/71  Pulse:   70 77  Resp:   18 18  Temp:  97.6 F (36.4 C)  98.2 F (36.8 C)  TempSrc:  Oral    SpO2:   99% 98%  Weight: (!) 140 kg     Height:       Cardiac: RRR, nl S1S2, nomurmur Pulmonary: CTAB, not in respiratory distress Abdominal: Obese abdomen, soft, has diffuse tenderness, no rebound, no guarding, bowel sounds are nl Skin: Warm and dry Extremities: no LE edema Psych: Alert and oriented x 3   Assessment/Plan:  Principal Problem:   GI bleed Active Problems:   ESRD on peritoneal dialysis (HCC)   Acute blood loss anemia   GIbleed with acute blood loss anemia: Likely due to known arteriovenous malformationsversus diverticulosis versus gastric ulcers.  Good response to transfusions, no more bleeding.  Hb 9.8 today (was 10.6 yesterday) GI saw her yesterday and will do video endoscopy this AM.  -Continue Protonix infusion for 72 h per GI (started 5/15@ 2000) then IV Protonix  40 mg IV BID -Continue Sucroferric 1000 mg TID befor meal -Video endoscopy today -Appreciate GI recommendations -Monitor VS  -CBC daily  ESRD on peritoneal dialysis: -Continue PD   Paradoxical atrial tachycardia/HTN:Heart rate and BPwithin acceptablelimits. Rate is normal at 77 now. Tele reviewed: NSR  -Continuous cardiac monitoring -Holding home metoprolol in the setting of hypotension with active GI bleed  MDD/Insomnia: -Continue home venlafaxine 75 mg daily -Continue home zolpidem 5 mg at  bedtime   Dispo: Anticipated discharge in approximately 1-2 days  Dewayne Hatch, MD 05/22/2018, 7:02 AM Pager: [phone]

## 2018-05-22 NOTE — Progress Notes (Signed)
Pt swallowed small pill givens capsule at 0855 this morning. Pt and RN educated on instructions of device.

## 2018-05-22 NOTE — Progress Notes (Signed)
Internal Medicine Teaching Service Attending:   I saw and examined the patient. I reviewed the resident's note and I agree with the resident's findings and plan as documented in the resident's note.  Principal Problem:   GI bleed Active Problems:   ESRD on peritoneal dialysis ([place])   Acute blood loss anemia  Clinically stable today, no further melena.  Stools turned green-colored last night with the bowel prep which seems like a reassuring sign that the bleeding has stopped spontaneously.  Greatly appreciate GI consultation.  She is doing the swallow pill endoscopy today.  We will follow-up the results of that tomorrow.  Hemoglobin level stable.  Lalla Brothers, MD FACP

## 2018-05-23 ENCOUNTER — Encounter (HOSPITAL_COMMUNITY): Payer: Self-pay | Admitting: Gastroenterology

## 2018-05-23 ENCOUNTER — Encounter: Payer: Self-pay | Admitting: Internal Medicine

## 2018-05-23 LAB — BASIC METABOLIC PANEL
Anion gap: 20 — ABNORMAL HIGH (ref 5–15)
BUN: 64 mg/dL — ABNORMAL HIGH (ref 6–20)
CO2: 18 mmol/L — ABNORMAL LOW (ref 22–32)
Calcium: 8 mg/dL — ABNORMAL LOW (ref 8.9–10.3)
Chloride: 97 mmol/L — ABNORMAL LOW (ref 98–111)
Creatinine, Ser: 16.88 mg/dL — ABNORMAL HIGH (ref 0.44–1.00)
GFR calc Af Amer: 3 mL/min — ABNORMAL LOW (ref >60)
GFR calc non Af Amer: 2 mL/min — ABNORMAL LOW (ref >60)
Glucose, Bld: 136 mg/dL — ABNORMAL HIGH (ref 70–99)
Potassium: 3.2 mmol/L — ABNORMAL LOW (ref 3.5–5.1)
Sodium: 135 mmol/L (ref 135–145)

## 2018-05-23 LAB — CBC
HCT: 30.1 % — ABNORMAL LOW (ref 36.0–46.0)
Hemoglobin: 10.1 g/dL — ABNORMAL LOW (ref 12.0–15.0)
MCH: 29.2 pg (ref 26.0–34.0)
MCHC: 33.6 g/dL (ref 30.0–36.0)
MCV: 87 fL (ref 80.0–100.0)
Platelets: 229 10*3/uL (ref 150–400)
RBC: 3.46 MIL/uL — ABNORMAL LOW (ref 3.87–5.11)
RDW: 16 % — ABNORMAL HIGH (ref 11.5–15.5)
WBC: 7.1 10*3/uL (ref 4.0–10.5)
nRBC: 1 % — ABNORMAL HIGH (ref 0.0–0.2)

## 2018-05-23 LAB — HEPATITIS B SURFACE ANTIGEN: Hepatitis B Surface Ag: NEGATIVE

## 2018-05-23 LAB — BODY FLUID CELL COUNT WITH DIFFERENTIAL: Total Nucleated Cell Count, Fluid: 1 cu mm (ref 0–1000)

## 2018-05-23 MED ORDER — HEPARIN 1000 UNIT/ML FOR PERITONEAL DIALYSIS
INTRAPERITONEAL | Status: DC | PRN
  Filled 2018-05-23: qty 5000

## 2018-05-23 MED ORDER — HEPARIN 1000 UNIT/ML FOR PERITONEAL DIALYSIS: 500.0000 [IU] | INTRAMUSCULAR | Status: DC | PRN

## 2018-05-23 MED ORDER — DELFLEX-LC/2.5% DEXTROSE 394 MOSM/L IP SOLN: INTRAPERITONEAL | Status: DC

## 2018-05-23 MED ORDER — POTASSIUM CHLORIDE CRYS ER 20 MEQ PO TBCR
20.0000 meq | EXTENDED_RELEASE_TABLET | Freq: Two times a day (BID) | ORAL | Status: DC
  Administered 2018-05-23: 20 meq via ORAL
  Filled 2018-05-23: qty 1

## 2018-05-23 MED ORDER — GENTAMICIN SULFATE 0.1 % EX CREA: 1.0000 "application " | TOPICAL_CREAM | Freq: Every day | CUTANEOUS | Status: DC

## 2018-05-23 NOTE — Progress Notes (Signed)
Capsule endoscopy completed.  Impression: Small bowel AVMs likely in the mid/distal ileum.  No active bleeding.  Beyond the reach of conventional push enteroscopy.  Plan: -Will arrange for double-balloon enteroscopy as an outpatient at [facility]. -We will sign off for now.     Patient seen and examined today. No further bleeding Tolerating p.o. well The abdominal pain is also better. Hb stable 10.    Carmell Austria MD [facility].

## 2018-05-23 NOTE — Progress Notes (Signed)
Renal Navigator notified [facility] PD program manager of patient's hospital admission and negative COVID 19 result by rapid test in order to provide continuity of care and safety.  Alphonzo Cruise Renal Navigator [phone]

## 2018-05-23 NOTE — Progress Notes (Signed)
Renal Navigator notified [facility] PD program manager of patient's discharge from hospital today to provide continuity of care.  Alphonzo Cruise Renal Navigator [phone]

## 2018-05-23 NOTE — Progress Notes (Addendum)
[facility] Progress Note   Subjective:  Seen in room. No issues with PD overnight - finishing up soon. Still with RLQ abd pain, s/p capsule endo yesterday - for results today. No further blood in stool that she has noticed. Resting comfortably to my exam-  Treatment complete- UF 1900  Objective Vitals:   05/22/18 2032 05/22/18 2033 05/23/18 0500 05/23/18 0522  BP: (!) 151/83 (!) 151/83  (!) 150/77  Pulse: 83 83  86  Resp: $Remo'18 19  20  'lqODs$ Temp: 97.8 F (36.6 C) 97.8 F (36.6 C)  98.4 F (36.9 C)  TempSrc: Oral Oral  Oral  SpO2: 97% 97%  97%  Weight: 125.2 kg 125.2 kg 125.2 kg   Height:       Physical Exam General: Well appearing, NAD Heart: RRR; no murmur Lungs: CTA anteriorly Abdomen: soft, mild generalized tenderness (R>L) Extremities: No LE edema Dialysis Access: PD cath - connected currently.  Additional Objective Labs: Basic Metabolic Panel: Recent Labs  Lab 05/21/18 0728 05/22/18 0759 05/23/18 0727  NA 138 136 135  K 3.4* 3.5 3.2*  CL 97* 96* 97*  CO2 19* 18* 18*  GLUCOSE 97 87 136*  BUN 60* 67* 64*  CREATININE 16.07* 16.86* 16.88*  CALCIUM 7.8* 8.0* 8.0*   Liver Function Tests: Recent Labs  Lab 05/20/18 1830  AST 28  ALT 18  ALKPHOS 75  BILITOT 0.5  PROT 6.5  ALBUMIN 3.4*   CBC: Recent Labs  Lab 05/20/18 2006 05/21/18 0728 05/21/18 2002 05/22/18 0759 05/23/18 0727  WBC 8.0 7.4 7.3 8.4 7.1  HGB 7.5* 10.6* 10.7* 9.8* 10.1*  HCT 23.6* 31.6* 31.0* 29.4* 30.1*  MCV 90.4 87.8 86.8 87.2 87.0  PLT 291 222 224 224 229   Medications: . pantoprozole (PROTONIX) infusion 8 mg/hr (05/23/18 0531)   . sodium chloride   Intravenous Once  . allopurinol  100 mg Oral QHS  . atorvastatin  40 mg Oral QHS  . calcitRIOL  0.5 mcg Oral Q breakfast  . gentamicin cream  1 application Topical Daily  . [START ON 05/24/2018] pantoprazole  40 mg Intravenous Q12H  . polyethylene glycol  51 g Oral Daily  . sucroferric oxyhydroxide  1,000 mg Oral TID AC   . venlafaxine XR  75 mg Oral QHS  . zolpidem  5 mg Oral QHS    Dialysis Orders: CCPD 7 days/ week EDW 108.5 kg  5 cycler exchanges 3L 2 hr dwell time last fill 3L 1 day exchange 3L 4 hr dwell time ( 6 exchanges in total)  Assessment/Plan: Pt is a 43 y.o. yo female with ESRD on PD who was admitted on 05/20/2018 with symptomatic GIB.   1. Symptomatic anemia/melena. Recurrent GIB with known AVMs.Extensive GI workup.Hgb 4.4 on admission. S/p 3U PRBCs. Capsule endo 5/17. Hgb stabilizing - Hgb 10.1 today. Per GI. Hgb stable last 48 hours  2. ESRD -Continue CCPD. Reports new abdominal pain this week.Hx of peritonitis. Will check PD fluid/culture while here.No fluid obtained yet, re-ordered 5/17. PD effluent is clear. 3. Hypertension/volume: BP slightly high - using 2.5% dextrose for PD - finishing up now, will review UF and adjust if needed.  4. Anemia: As above.Has been getting Mircera 225 q week for last 3 weeks at OP unit. Last dose 5/15.Transfused, current hgb over 10 5. Metabolic bone disease -Corr Ca ok. On Velphoro binder. Continue Calcitriol 0.5 qd.  Veneta Penton, PA-C 05/23/2018, 9:47 AM  [facility] Pager: [phone]  Patient seen and  examined, agree with above note with above modifications. ESRD on PD- capsule endo is ongoing- no further obvious GIbleeding- hgb stable last 48 hours- PD went well, never did get cell count for unclear reasons but effluent grossly clear.  No changes to PD orders tonight, cont with 2.5%.  K is low, not eating, will replete  Corliss Parish, MD 05/23/2018

## 2018-05-23 NOTE — Progress Notes (Signed)
DISCHARGE NOTE HOME Minaal Bihm to be discharged Home per MD order. Discussed prescriptions and follow up appointments with the patient. Prescriptions given to patient; medication list explained in detail. Patient verbalized understanding.  Skin clean, dry and intact without evidence of skin break down, no evidence of skin tears noted. IV catheter discontinued intact. Site without signs and symptoms of complications. Dressing and pressure applied. Pt denies pain at the site currently. No complaints noted.  Patient free of lines, drains, and wounds.   An After Visit Summary (AVS) was printed and given to the patient. Patient escorted via wheelchair, and discharged home via private auto.  Arlyss Repress, RN

## 2018-05-23 NOTE — Discharge Summary (Addendum)
Name: Patricia [name] MRN: [id number] DOB: 1975-06-09 43 y.o. PCP: Elayne Guerin  Date of Admission: 05/20/2018  6:14 PM Date of Discharge: 05/23/2018 Attending Physician: Dr. Evette Doffing   Discharge Diagnosis: 1. GI Bleed 2. Small Bowel AVMs  Discharge Medications: Allergies as of 05/23/2018      Reactions   Nsaids Other (See Comments)   Cannot take due to Kidney disease/Kidney function   Tolmetin Other (See Comments)   Cannot take due to Kidney Disease      Medication List    TAKE these medications   allopurinol 100 MG tablet Commonly known as:  ZYLOPRIM Take 100 mg by mouth at bedtime.   ALPRAZolam 1 MG tablet Commonly known as:  XANAX Take 1 mg by mouth 2 (two) times daily as needed for anxiety.   atorvastatin 40 MG tablet Commonly known as:  LIPITOR Take 40 mg by mouth at bedtime.   calcitRIOL 0.5 MCG capsule Commonly known as:  ROCALTROL Take 1 capsule (0.5 mcg total) by mouth daily with breakfast. What changed:  when to take this   gentamicin ointment 0.1 % Commonly known as:  GARAMYCIN Apply 1 application topically daily as needed (changing dressing.).   hydrOXYzine 25 MG tablet Commonly known as:  ATARAX/VISTARIL Take 25 mg by mouth every 6 (six) hours as needed for itching.   metoprolol tartrate 25 MG tablet Commonly known as:  LOPRESSOR Take 25 mg by mouth at bedtime.   multivitamin Tabs tablet Take 1 tablet by mouth at bedtime.   omeprazole 40 MG capsule Commonly known as:  PRILOSEC Take 40 mg by mouth 2 (two) times daily.   prochlorperazine 10 MG tablet Commonly known as:  COMPAZINE Take 1 tablet (10 mg total) by mouth every 8 (eight) hours as needed for nausea or vomiting.   promethazine 25 MG tablet Commonly known as:  PHENERGAN Take 1 tablet (25 mg total) by mouth every 8 (eight) hours as needed for nausea or vomiting.   Velphoro 500 MG chewable tablet Generic drug:  sucroferric oxyhydroxide Chew 1,000 mg by mouth 3 (three) times  daily before meals.   venlafaxine XR 75 MG 24 hr capsule Commonly known as:  EFFEXOR-XR Take 75 mg by mouth at bedtime.   zolpidem 5 MG tablet Commonly known as:  AMBIEN Take 5 mg by mouth at bedtime.       Disposition and follow-up:   [name] was discharged from [facility] in Stable condition.  At the hospital follow up visit please address:  1.  GI Bleed: Presented with melena and hemoglobin 4.4. Has known history of AVMs with extensive prior work up. Repeat capsule endoscopy this admission showed non bleeding AVMs in the mid/distal ileum, beyond the reach of conventional push enteroscopy. Hemoglobin stabilized after 3 units of pRBCs, 10.1 on discharge. She was discharged with plans for outpatient double-balloon enteroscopy at [facility].   2.  Labs / imaging needed at time of follow-up: CBC  3.  Pending labs/ test needing follow-up: 5/18 peripheral blood smear  Follow-up Appointments: Follow-up Information    Long, Caryl Pina, [place].   Specialty:  Physician Assistant Contact information: [id number] [place] [id number] [place] [facility] Course by problem list:  1. GI Bleed: Patient is a 43 yo F with pmhx of ESRD on PD s/p kidney transplant at [facility] and obscure GI bleeding with angiectasias seen on prior incomplete capsule endoscopy in February of 2020.  Patient presented with recurrent significant GI bleed and melena, found to have hemoglobin of 4.4 on arrival to the ED. She received 3 units of pRBC with post transfusion hgb 7.5. GI was consulted. She underwent repeat capsule endoscopy this admission that showed non bleeding AVMs in the mid/distal ileum, beyond the reach of conventional push enteroscopy. Bleeding stopped and hemoglobin improved to 10.1 the following day. She was discharged with plans for double-balloon enteroscopy as an outpatient at [facility]. Prior GI work up including this admission summarized below.   Prior GI  Work-up:  06/04/2017 EGD: Patchy moderate inflammation characterized by congestion (edema), erosions and shallow ulcerations found in the prepyloric region of the stomach. The exam of the stomach was otherwise normal. Localized mild inflammation characterized by congestion (edema) and erythema was found in the duodenal bulb.The exam of the duodenum was otherwise normal.    EGD 09/03/2017: A non-obstructing Schatzki ring was found at the gastroesophageal junction.A small hiatal hernia was present. Abnormal motility was noted in the esophagus. Localized mild inflammation characterized by congestion (edema) and erythema was found in the prepyloric region of the stomach. Scattered mild inflammation was found in the duodenal bulb. The second portion of the duodenum was normal.  09/04/2017 Colonoscopy: patch area of moderately congested and erythematous mucosa in the sigmoid, transverse and ascending colon, mucosal friability in the ascending colon, minimal fresh blood in the cecum, no active bleeding on thorough lavage. Diverticulosis sigmoid and descending colon.  11/02/2017 Small bowel capsule endoscopy: mild gastritis and duodenitis, no source of anemia identified.   12/24/2017 Small bowel endoscopy: Non-bleeding gastric ulcer with a clean ulcer base (Forrest Class III). Non-bleeding erosive gastropathy.- Erythematous duodenopathy in bulb. Normal mucosa was found in the jejunum. Tattooed distal extent to demarcate distal extent of Push SBE.-Successful completion of the Video Capsule Enteroscope placement.  02/11/2018 Small bowel capsule endoscopy: incomplete capsule study, retained capsule, 2-3 non bleeding AVMs found 2hrs and 34 minutes too distal to approach with a regular enteroscope.   02/18/2018 EGD: No gross lesions in esophagus. 3 cm hiatal hernia. Gastroesophageal flap valve classified as Hill Grade IV. Previous ulcer has healed.- No gross lesions in the duodenal bulb, in the first portion  of the duodenum, in the second portion of the duodenum, in the third portion of the duodenum and in the fourth portion of the duodenum. Prominent Ampulla. - Normal mucosa was found in the proximal jejunum. Tattooed distal most extent of enteroscopy.  05/22/2018 Capsule Endoscopy: Impression: Small bowel AVMs likely in the mid/distal ileum.  No active bleeding.  Beyond the reach of conventional push enteroscopy.   Discharge Vitals:   BP (!) 144/82 (BP Location: Right Leg)   Pulse 88   Temp 98.1 F (36.7 C) (Oral)   Resp 18   Ht $R'5\' 7"'tJ$  (1.702 m)   Wt (!) 137.1 kg   SpO2 97%   BMI 47.34 kg/m   Pertinent Labs, Studies, and Procedures:   CBC  Ref. Range 05/20/2018 18:30 05/20/2018 20:06 05/21/2018 07:28 05/22/2018 07:59 05/23/2018 07:27  WBC Latest Ref Range: 4.0 - 10.5 K/uL 11.9 (H) 8.0 7.4 8.4 7.1  RBC Latest Ref Range: 3.87 - 5.11 MIL/uL 1.46 (L) 2.61 (L) 3.60 (L) 3.37 (L) 3.46 (L)  Hemoglobin Latest Ref Range: 12.0 - 15.0 g/dL 4.4 (LL) 7.5 (L) 10.6 (L) 9.8 (L) 10.1 (L)  HCT Latest Ref Range: 36.0 - 46.0 % 13.6 (L) 23.6 (L) 31.6 (L) 29.4 (L) 30.1 (L)  MCV Latest Ref Range: 80.0 - 100.0 fL  93.2 90.4 87.8 87.2 87.0  MCH Latest Ref Range: 26.0 - 34.0 pg 30.1 28.7 29.4 29.1 29.2  MCHC Latest Ref Range: 30.0 - 36.0 g/dL 32.4 31.8 33.5 33.3 33.6  RDW Latest Ref Range: 11.5 - 15.5 % 18.7 (H) 18.6 (H) 16.2 (H) 16.2 (H) 16.0 (H)  Platelets Latest Ref Range: 150 - 400 K/uL 385 291 222 224 229  nRBC Latest Ref Range: 0.0 - 0.2 % 1.1 (H) 2.5 (H) 1.9 (H) 1.2 (H) 1.0 (H)    Discharge Instructions: Discharge Instructions    Call MD for:  difficulty breathing, headache or visual disturbances   Complete by:  As directed    Call MD for:  extreme fatigue   Complete by:  As directed    Call MD for:  persistant dizziness or light-headedness   Complete by:  As directed    Call MD for:  persistant nausea and vomiting   Complete by:  As directed    Call MD for:  temperature >100.4   Complete by:  As  directed    Diet - low sodium heart healthy   Complete by:  As directed    Discharge instructions   Complete by:  As directed    [name],   It was a pleasure taking care of you. I am glad you are doing better. Please make an appointment to follow up with your primary care doctor in the next two weeks and follow up with the gastroenterologist as instructed.   Thank you!  Dr. Myrtie Hawk   Increase activity slowly   Complete by:  As directed       Signed: Velna Ochs, MD 05/24/2018, 7:10 AM   Pager: [phone]

## 2018-05-24 ENCOUNTER — Telehealth: Payer: Self-pay

## 2018-05-24 LAB — PATHOLOGIST SMEAR REVIEW

## 2018-05-24 NOTE — Telephone Encounter (Signed)
Dr Rush Landmark all information and referral has been faxed to [facility].  They should have that information today.

## 2018-05-24 NOTE — Telephone Encounter (Signed)
Mansouraty, Telford Nab., MD  Clearence Cheek; Timothy Lasso, RN; Jackquline Denmark, MD        Thank you for update Nicola Police, were you able to complete the entire note as well so that we can send the information to [facility] with the pictures?  Chong Sicilian, this is on Kanyla Omeara ([phone]).  Please send a referral to [employer] and Roselyn Reef at [facility] for consideration of Double Balloon Enteroscopy - Retrograde.  Once their office has received the fax, please let me know and I'll also reach out to my former colleagues to give them a background.  They will need the EGD/Colon and VCE from [facility].  They will need the VCE & SBEs from use.  They will need at least the report that is in Epic for now from the recent VCE.  Thank you all for the help.   Gabe   Previous Messages    ----- Message -----  From: Clearence Cheek  Sent: 05/23/2018 11:28 AM EDT  To: Timothy Lasso, RN, Irving Copas., MD  Subject: arranging balloon enteroscopy at [facility]  Pt's capsule study 5/18 showing non-bleeding SB AVMs. See note by Dr Lyndel Safe who read the study.   I suspect Dr Jerilynn Mages wants pt to under go balloon enteroscopy but notifying you so you can make arrangements if that is the plan   Thanks, Judson Roch

## 2018-05-26 LAB — BODY FLUID CULTURE
Culture: NO GROWTH
Gram Stain: NONE SEEN

## 2018-07-06 ENCOUNTER — Emergency Department (HOSPITAL_COMMUNITY): Payer: Medicare Other

## 2018-07-06 ENCOUNTER — Emergency Department (HOSPITAL_COMMUNITY)
Admission: EM | Admit: 2018-07-06 | Discharge: 2018-07-07 | Disposition: A | Discharge status: Home / Self Care | Payer: Medicare Other | Attending: Emergency Medicine | Admitting: Emergency Medicine

## 2018-07-06 ENCOUNTER — Encounter (HOSPITAL_COMMUNITY): Payer: Self-pay

## 2018-07-06 ENCOUNTER — Other Ambulatory Visit: Payer: Self-pay

## 2018-07-06 DIAGNOSIS — R079 Chest pain, unspecified: Secondary | ICD-10-CM | POA: Diagnosis present

## 2018-07-06 DIAGNOSIS — I120 Hypertensive chronic kidney disease with stage 5 chronic kidney disease or end stage renal disease: Secondary | ICD-10-CM | POA: Insufficient documentation

## 2018-07-06 DIAGNOSIS — Z992 Dependence on renal dialysis: Secondary | ICD-10-CM | POA: Insufficient documentation

## 2018-07-06 DIAGNOSIS — B349 Viral infection, unspecified: Secondary | ICD-10-CM | POA: Diagnosis not present

## 2018-07-06 DIAGNOSIS — Z79899 Other long term (current) drug therapy: Secondary | ICD-10-CM | POA: Insufficient documentation

## 2018-07-06 DIAGNOSIS — N186 End stage renal disease: Secondary | ICD-10-CM | POA: Diagnosis not present

## 2018-07-06 LAB — BASIC METABOLIC PANEL
Anion gap: 19 — ABNORMAL HIGH (ref 5–15)
BUN: 60 mg/dL — ABNORMAL HIGH (ref 6–20)
CO2: 22 mmol/L (ref 22–32)
Calcium: 8.5 mg/dL — ABNORMAL LOW (ref 8.9–10.3)
Chloride: 94 mmol/L — ABNORMAL LOW (ref 98–111)
Creatinine, Ser: 16.48 mg/dL — ABNORMAL HIGH (ref 0.44–1.00)
GFR calc Af Amer: 3 mL/min — ABNORMAL LOW (ref >60)
GFR calc non Af Amer: 2 mL/min — ABNORMAL LOW (ref >60)
Glucose, Bld: 131 mg/dL — ABNORMAL HIGH (ref 70–99)
Potassium: 3.8 mmol/L (ref 3.5–5.1)
Sodium: 135 mmol/L (ref 135–145)

## 2018-07-06 LAB — CBC
HCT: 26.9 % — ABNORMAL LOW (ref 36.0–46.0)
Hemoglobin: 8.6 g/dL — ABNORMAL LOW (ref 12.0–15.0)
MCH: 28.4 pg (ref 26.0–34.0)
MCHC: 32 g/dL (ref 30.0–36.0)
MCV: 88.8 fL (ref 80.0–100.0)
Platelets: 297 10*3/uL (ref 150–400)
RBC: 3.03 MIL/uL — ABNORMAL LOW (ref 3.87–5.11)
RDW: 14.8 % (ref 11.5–15.5)
WBC: 10.5 10*3/uL (ref 4.0–10.5)
nRBC: 0.3 % — ABNORMAL HIGH (ref 0.0–0.2)

## 2018-07-06 LAB — I-STAT BETA HCG BLOOD, ED (MC, WL, AP ONLY): I-stat hCG, quantitative: <5 m[IU]/mL (ref <5)

## 2018-07-06 LAB — TROPONIN I (HIGH SENSITIVITY): Troponin I (High Sensitivity): 14 ng/L (ref <18)

## 2018-07-06 MED ORDER — ALBUTEROL SULFATE HFA 108 (90 BASE) MCG/ACT IN AERS
2.0000 | INHALATION_SPRAY | Freq: Once | RESPIRATORY_TRACT | Status: AC
  Administered 2018-07-07: 2 via RESPIRATORY_TRACT
  Filled 2018-07-06: qty 6.7

## 2018-07-06 MED ORDER — OXYCODONE-ACETAMINOPHEN 5-325 MG PO TABS
1.0000 | ORAL_TABLET | Freq: Once | ORAL | Status: AC
  Administered 2018-07-07: 1 via ORAL
  Filled 2018-07-06: qty 1

## 2018-07-06 MED ORDER — SODIUM CHLORIDE 0.9% FLUSH: 3.0000 mL | Freq: Once | INTRAVENOUS | Status: DC

## 2018-07-06 MED ORDER — BENZONATATE 100 MG PO CAPS
100.0000 mg | ORAL_CAPSULE | Freq: Once | ORAL | Status: AC
  Administered 2018-07-07: 100 mg via ORAL
  Filled 2018-07-06: qty 1

## 2018-07-06 NOTE — ED Triage Notes (Signed)
Pt had DEA procedure done yesterday at [facility].  Onset this morning chest pain, shortness of breath, temp 100.1, leg pain, and cough.    Pt went to [facility] today, waited 6 hours, COVID negative, labs drawn.  Pt left [facility] to come back to [facility].   Peritoneal dialysis last done overnight.

## 2018-07-06 NOTE — ED Provider Notes (Signed)
Emergency Department Provider Note   I have reviewed the triage vital signs and the nursing notes.   HISTORY  Chief Complaint Chest Pain   HPI Patricia [name] is a 43 y.o. female with multiple medical problems documented below who had what sounds like an endoscopy for AVMs yesterday.  Patient reports that she is here because she feels generalized weakness, febrile with a T-max of 100.1 orally, muscle aches, cough, chest pain headache.  She was tested for coronavirus last week and it was negative when tested today was told that she was negative as well.  I reviewed the records it appears that she told her gastroenterologist that she had trouble moving her legs earlier today but she denies this at this time.  She states that she feels sore and weak all over.  States that she feels like she has the flu.  No other associated or modifying symptoms.    Past Medical History:  Diagnosis Date  . Acid reflux   . Anxiety   . Arthritis   . Dyspnea   . Dysrhythmia    tachycardia  . ESRD (end stage renal disease) ([place])    [facility]- [place]  . GI bleeding 12/2017  . Gout   . Headache(784.0)    "related to chemo; sometimes weekly" (09/12/2013)  . High cholesterol   . History of blood transfusion "a couple"   "related to low counts"  . Hypertension   . Iron deficiency anemia    "get epogen shots q month" (02/20/2014)  . 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 Peritoneal dialysis status ([place])   . Stroke [facility]) 08/15/2017    Patient Active Problem List   Diagnosis Date Noted  . Acute blood loss anemia 05/21/2018  . GI bleed 05/20/2018  . ESRD on peritoneal dialysis ([place]) 04/01/2016  . Anemia due to chronic kidney disease 04/01/2016  . GERD (gastroesophageal reflux disease) 04/01/2016  . Nephrotic syndrome 10/02/2013  . Dyslipidemia 10/02/2013  . PAT (paroxysmal atrial tachycardia) ([place]) 09/29/2013  . Normocytic anemia  06/29/2013  . Glomerulonephritis 01/28/2013  . Morbid obesity ([place]) 07/04/2011  . HTN (hypertension) 07/04/2011  . Hypercholesterolemia 07/04/2011    Past Surgical History:  Procedure Laterality Date  . ABDOMINAL HYSTERECTOMY  2010   "laparoscopic"  . ANKLE FRACTURE SURGERY Right 1994  . AV FISTULA PLACEMENT Left 04/14/2016   Procedure: ARTERIOVENOUS (AV) FISTULA CREATION;  Surgeon: Angelia Mould, MD;  Location: [facility] OR;  Service: Vascular;  Laterality: Left;  . AV FISTULA PLACEMENT Left 08/09/2017   Procedure: INSERTION OF ARTERIOVENOUS (AV) GORE-TEX GRAFT LEFT ARM;  Surgeon: Elam Dutch, MD;  Location: [place];  Service: Vascular;  Laterality: Left;  . [place] REMOVAL Left 08/11/2017   Procedure: REMOVAL OF ARTERIOVENOUS GORETEX GRAFT ([place]);  Surgeon: Serafina Mitchell, MD;  Location: [facility] OR;  Service: Vascular;  Laterality: Left;  . BIOPSY  09/03/2017   Procedure: BIOPSY;  Surgeon: Otis Brace, MD;  Location: [place];  Service: Gastroenterology;;  . BIOPSY  12/24/2017   Procedure: BIOPSY;  Surgeon: Irving Copas., MD;  Location: [place];  Service: Gastroenterology;;  . CARDIAC CATHETERIZATION  2000's  . COLONOSCOPY WITH PROPOFOL N/A 09/04/2017   Procedure: COLONOSCOPY WITH PROPOFOL;  Surgeon: Ronnette Juniper, MD;  Location: [place];  Service: Gastroenterology;  Laterality: N/A;  . ENTEROSCOPY N/A 12/24/2017   Procedure: ENTEROSCOPY;  Surgeon: Rush Landmark Telford Nab., MD;  Location: [place];  Service: Gastroenterology;  Laterality: N/A;  . ESOPHAGOGASTRODUODENOSCOPY    . ESOPHAGOGASTRODUODENOSCOPY (EGD) WITH PROPOFOL Left 06/04/2017   Procedure: ESOPHAGOGASTRODUODENOSCOPY (EGD) WITH PROPOFOL;  Surgeon: Arta Silence, MD;  Location: [place];  Service: Endoscopy;  Laterality: Left;  . ESOPHAGOGASTRODUODENOSCOPY (EGD) WITH PROPOFOL N/A 09/03/2017   Procedure: ESOPHAGOGASTRODUODENOSCOPY (EGD) WITH PROPOFOL;  Surgeon: Otis Brace, MD;  Location: [place];  Service: Gastroenterology;  Laterality: N/A;  . FISTULA SUPERFICIALIZATION Left 04/02/2017   Procedure: FISTULA SUPERFICIALIZATION LEFT ARM;  Surgeon: Serafina Mitchell, MD;  Location: [place];  Service: Vascular;  Laterality: Left;  . FISTULOGRAM Left 04/07/2016   Procedure: FISTULOGRAM;  Surgeon: Waynetta Sandy, MD;  Location: [place];  Service: Vascular;  Laterality: Left;  . FRACTURE SURGERY     right ankle  . GIVENS CAPSULE STUDY N/A 10/29/2017   Procedure: GIVENS CAPSULE STUDY;  Surgeon: Wilford Corner, MD;  Location: [place];  Service: Endoscopy;  Laterality: N/A;  . GIVENS CAPSULE STUDY N/A 12/24/2017   Procedure: GIVENS CAPSULE STUDY;  Surgeon: Irving Copas., MD;  Location: [place];  Service: Gastroenterology;  Laterality: N/A;  . GIVENS CAPSULE STUDY N/A 05/22/2018   Procedure: GIVENS CAPSULE STUDY;  Surgeon: Irving Copas., MD;  Location: [place];  Service: Gastroenterology;  Laterality: N/A;  . INSERTION OF DIALYSIS CATHETER Right 04/07/2016   Procedure: INSERTION OF DIALYSIS CATHETER;  Surgeon: Waynetta Sandy, MD;  Location: [place];  Service: Vascular;  Laterality: Right;  . INSERTION OF DIALYSIS CATHETER Right 04/04/2017   Procedure: INSERTION OF DIALYSIS CATHETER;  Surgeon: Waynetta Sandy, MD;  Location: [place];  Service: Vascular;  Laterality: Right;  . LIGATION OF ARTERIOVENOUS  FISTULA Left 04/14/2016   Procedure: LIGATION OF ARTERIOVENOUS  FISTULA;  Surgeon: Angelia Mould, MD;  Location: [place];  Service: Vascular;  Laterality: Left;  . PATCH ANGIOPLASTY Left 06/19/2016   Procedure: PATCH ANGIOPLASTY LEFT ARTERIOVENOUS FISTULA;  Surgeon: Angelia Mould, MD;  Location: [place];  Service: Vascular;  Laterality: Left;  . POLYPECTOMY  09/04/2017   Procedure: POLYPECTOMY;  Surgeon: Ronnette Juniper, MD;  Location[facility]COPY;  Service: Gastroenterology;;  . REVISON OF ARTERIOVENOUS FISTULA Left 06/19/2016    Procedure: REVISION SUPERFICIALIZATION OF BRACHIOCEPHALIC ARTERIOVENOUS FISTULA;  Surgeon: Angelia Mould, MD;  Location: [place];  Service: Vascular;  Laterality: Left;    Current Outpatient Rx  . Order #: [id number] Class: Historical Med  . Order #: [id number] Class: Historical Med  . Order #: [id number] Class: Historical Med  . Order #: [id number] Class: Print  . Order #: [id number] Class: Normal  . Order #: [id number] Class: Historical Med  . Order #: [id number] Class: Normal  . Order #: [id number] Class: Historical Med  . Order #: [id number] Class: Normal  . Order #: [id number] Class: Historical Med  . Order #: [id number] Class: Historical Med  . Order #: [id number] Class: Historical Med  . Order #: [id number] Class: Normal  . Order #: [id number] Class: Normal  . Order #: [id number] Class: Historical Med  . Order #: [id number] Class: Historical Med  . Order #: [id number] Class: Historical Med    Allergies Nsaids and Tolmetin  Family History  Problem Relation Age of [place]  . Hypertension Mother   . Thyroid disease Mother   . Coronary artery disease Father   . Hypertension Father   . Diabetes Father   . Colon cancer Maternal Aunt   . Esophageal cancer Maternal Uncle   . Inflammatory bowel disease Neg Hx   . Liver disease Neg Hx   . Pancreatic cancer Neg Hx   .  Rectal cancer Neg Hx   . Stomach cancer Neg Hx     Social History Social History   Tobacco Use  . Smoking status: Never Smoker  . Smokeless tobacco: Never Used  Substance Use Topics  . Alcohol use: No  . Drug use: No    Review of Systems  All other systems negative except as documented in the HPI. All pertinent positives and negatives as reviewed in the HPI. ____________________________________________   PHYSICAL EXAM:  VITAL SIGNS: ED Triage Vitals  Enc Vitals Group     BP 07/06/18 1932 135/82     Pulse Rate 07/06/18 1932 87     Resp 07/06/18 1932 18     Temp 07/06/18 1932 97.9 F (36.6 C)     Temp Source 07/06/18  1932 Oral     SpO2 07/06/18 1932 100 %    Constitutional: Alert and oriented. Well appearing and in no acute distress. Eyes: Conjunctivae are normal. PERRL. EOMI. Head: Atraumatic. Nose: No congestion/rhinnorhea. Mouth/Throat: Mucous membranes are moist.  Oropharynx non-erythematous. Neck: No stridor.  No meningeal signs.   Cardiovascular: Normal rate, regular rhythm. Good peripheral circulation. Grossly normal heart sounds.   Respiratory: Normal respiratory effort.  No retractions. Lungs CTAB. Gastrointestinal: Soft and nontender. No distention. PD catheter in place.  Musculoskeletal: No lower extremity tenderness nor edema. No gross deformities of extremities. Neurologic:  Normal speech and language. No gross focal neurologic deficits are appreciated.  Skin:  Skin is warm, dry and intact. No rash noted.  ____________________________________________   LABS (all labs ordered are listed, but only abnormal results are displayed)  Labs Reviewed  BASIC METABOLIC PANEL - Abnormal; Notable for the following components:      Result Value   Chloride 94 (*)    Glucose, Bld 131 (*)    BUN 60 (*)    Creatinine, Ser 16.48 (*)    Calcium 8.5 (*)    GFR calc non Af Amer 2 (*)    GFR calc Af Amer 3 (*)    Anion gap 19 (*)    All other components within normal limits  CBC - Abnormal; Notable for the following components:   RBC 3.03 (*)    Hemoglobin 8.6 (*)    HCT 26.9 (*)    nRBC 0.3 (*)    All other components within normal limits  TROPONIN I (HIGH SENSITIVITY)  TROPONIN I (HIGH SENSITIVITY)  I-STAT BETA HCG BLOOD, ED ([facility], [facility], [facility] ONLY)   ____________________________________________  EKG   EKG Interpretation  Date/Time:  Wednesday July 06 2018 19:30:55 EDT Ventricular Rate:  89 PR Interval:  148 QRS Duration: 88 QT Interval:  390 QTC Calculation: 474 R Axis:   31 Text Interpretation:  Normal sinus rhythm Cannot rule out Anterior infarct , age undetermined Abnormal ECG  similar to may 15 with some improved ST's Confirmed by Marily Memos [phone]) on 07/06/2018 11:53:48 PM       ____________________________________________  RADIOLOGY  Dg Chest 2 View  Result Date: 07/06/2018 CLINICAL DATA:  Chest pain EXAM: CHEST - 2 VIEW COMPARISON:  04/29/2018, 04/06/2018 FINDINGS: No acute airspace disease or effusion. Borderline heart size. No pneumothorax. IMPRESSION: No active cardiopulmonary disease.  Borderline cardiomegaly. Electronically Signed   By: Jasmine Pang M.D.   On: 07/06/2018 21:27    ____________________________________________   PROCEDURES  Procedure(s) performed:   Procedures   ____________________________________________   INITIAL IMPRESSION / ASSESSMENT AND PLAN / ED COURSE  Patricia [name] was evaluated in Emergency Department on  07/07/2018 for the symptoms described in the history of present illness. She was evaluated in the context of the global COVID-19 pandemic, which necessitated consideration that the patient might be at risk for infection with the SARS-CoV-2 virus that causes COVID-19. Institutional protocols and algorithms that pertain to the evaluation of patients at risk for COVID-19 are in a state of rapid change based on information released by regulatory bodies including the CDC and federal and state organizations. These policies and algorithms were followed during the patient's care in the ED.   Low risk for ACS, less likely PE, coughing on my exam likely viral induced bronchitis but is immune-compromised with dialysis so will cover for possible bacterial causes. Abdomen is benign as this time, doubt complication from procedure. Some of this could be related to being intubated as well. Will treat symptomatically for the time being.   reeval and patient states she did not have LE weakness at any point it is just painful with movement and thus she doesn't want to move. She is still having it and wants to try some robaxin to see if it  will help.  Robaxin helped. Patient shows me by moving her feet and legs stating they feel much better.   Dc on same. pcp follow up.  Pertinent labs & imaging results that were available during my care of the patient were reviewed by me and considered in my medical decision making (see chart for details).  A medical screening exam was performed and I feel the patient has had an appropriate workup for their chief complaint at this time and likelihood of emergent condition existing is low. They have been counseled on decision, discharge, follow up and which symptoms necessitate immediate return to the emergency department. They or their family verbally stated understanding and agreement with plan and discharged in stable condition.   ____________________________________________  FINAL CLINICAL IMPRESSION(S) / ED DIAGNOSES  Final diagnoses:  Viral illness     MEDICATIONS GIVEN DURING THIS VISIT:  Medications  sodium chloride flush (NS) 0.9 % injection 3 mL (has no administration in time range)  oxyCODONE-acetaminophen (PERCOCET/ROXICET) 5-325 MG per tablet 1 tablet (1 tablet Oral Given 07/07/18 0006)  albuterol (VENTOLIN HFA) 108 (90 Base) MCG/ACT inhaler 2 puff (2 puffs Inhalation Given 07/07/18 0006)  benzonatate (TESSALON) capsule 100 mg (100 mg Oral Given 07/07/18 0006)  methocarbamol (ROBAXIN) tablet 500 mg (500 mg Oral Given 07/07/18 0140)     NEW OUTPATIENT MEDICATIONS STARTED DURING THIS VISIT:  Discharge Medication List as of 07/07/2018  2:40 AM    START taking these medications   Details  doxycycline (VIBRAMYCIN) 100 MG capsule Take 1 capsule (100 mg total) by mouth 2 (two) times daily. One po bid x 7 days, Starting Thu 07/07/2018, Normal    HYDROcodone-homatropine (HYCODAN) 5-1.5 MG/5ML syrup Take 5 mLs by mouth every 6 (six) hours as needed for cough., Starting Thu 07/07/2018, Normal    methocarbamol (ROBAXIN) 500 MG tablet Take 1 tablet (500 mg total) by mouth every 8 (eight) hours  as needed for muscle spasms., Starting Thu 07/07/2018, Normal        Note:  This note was prepared with assistance of Dragon voice recognition software. Occasional wrong-word or sound-a-like substitutions may have occurred due to the inherent limitations of voice recognition software.   Copelyn Widmer, Corene Cornea, MD 07/07/18 [phone]

## 2018-07-07 LAB — TROPONIN I (HIGH SENSITIVITY): Troponin I (High Sensitivity): 13 ng/L (ref <18)

## 2018-07-07 MED ORDER — HYDROCODONE-HOMATROPINE 5-1.5 MG/5ML PO SYRP: 5.0000 mL | ORAL_SOLUTION | Freq: Four times a day (QID) | ORAL | 0 refills | Status: DC | PRN

## 2018-07-07 MED ORDER — METHOCARBAMOL 500 MG PO TABS
500.0000 mg | ORAL_TABLET | Freq: Once | ORAL | Status: AC
  Administered 2018-07-07: 500 mg via ORAL
  Filled 2018-07-07: qty 1

## 2018-07-07 MED ORDER — DOXYCYCLINE HYCLATE 100 MG PO CAPS: 100.0000 mg | ORAL_CAPSULE | Freq: Two times a day (BID) | ORAL | 0 refills | Status: DC

## 2018-07-07 MED ORDER — METHOCARBAMOL 500 MG PO TABS: 500.0000 mg | ORAL_TABLET | Freq: Three times a day (TID) | ORAL | 0 refills | Status: DC | PRN

## 2018-07-10 ENCOUNTER — Emergency Department (HOSPITAL_COMMUNITY)
Admission: EM | Admit: 2018-07-10 | Discharge: 2018-07-11 | Disposition: A | Discharge status: Home / Self Care | Payer: Medicare Other | Attending: Emergency Medicine | Admitting: Emergency Medicine

## 2018-07-10 ENCOUNTER — Ambulatory Visit (INDEPENDENT_AMBULATORY_CARE_PROVIDER_SITE_OTHER)
Admission: EM | Admit: 2018-07-10 | Discharge: 2018-07-10 | Disposition: A | Discharge status: Home / Self Care | Payer: Medicare Other | Source: Home / Self Care | Attending: Family Medicine | Admitting: Family Medicine

## 2018-07-10 ENCOUNTER — Encounter (HOSPITAL_COMMUNITY): Payer: Self-pay

## 2018-07-10 ENCOUNTER — Other Ambulatory Visit: Payer: Self-pay

## 2018-07-10 DIAGNOSIS — J209 Acute bronchitis, unspecified: Secondary | ICD-10-CM

## 2018-07-10 DIAGNOSIS — N186 End stage renal disease: Secondary | ICD-10-CM | POA: Diagnosis not present

## 2018-07-10 DIAGNOSIS — Z992 Dependence on renal dialysis: Secondary | ICD-10-CM | POA: Insufficient documentation

## 2018-07-10 DIAGNOSIS — B349 Viral infection, unspecified: Secondary | ICD-10-CM

## 2018-07-10 DIAGNOSIS — Z79899 Other long term (current) drug therapy: Secondary | ICD-10-CM | POA: Insufficient documentation

## 2018-07-10 DIAGNOSIS — R0602 Shortness of breath: Secondary | ICD-10-CM

## 2018-07-10 DIAGNOSIS — I120 Hypertensive chronic kidney disease with stage 5 chronic kidney disease or end stage renal disease: Secondary | ICD-10-CM | POA: Diagnosis not present

## 2018-07-10 NOTE — ED Provider Notes (Signed)
[place]    CSN: [id number] Arrival date & time: 07/10/18  1627      History   Chief Complaint Chief Complaint  Patient presents with  . Fever  . Chest Pain    HPI Patricia [name] is a 43 y.o. female.   HPI  Patient went to [facility] emergency room on 07/06/2018.  She felt poorly after having an endoscopy.  Fever and body aches.  They did a CT abdomen.  Some lab work.  Tested her for coronavirus, and it was negative.  They planned to give her IV fluids and medication, patient refused and left prior to completion of treatment.  She ended up at [facility] emergency department later the same day.  Spent most of the night in our emergency room got a chest x-ray, more blood work, pregnancy test (-) and EKG.  She was diagnosed with bronchitis.  She was given doxycycline 100 mg twice daily.  Hycodan cough medicine.  Told to follow-up with her PCP. She is here, 3 days later, because she still has coughing, feeling of shortness of breath, fatigue.  She is having trouble sleeping flat on her back.  Is requested to sleep in a recliner.  No sputum production.  No more fever.  No chest pain.  She states "I feel like I cannot breathe". Oxygen level is 97% She was also given an albuterol inhaler which she states she thinks is helpful  Past Medical History:  Diagnosis Date  . Acid reflux   . Anxiety   . Arthritis   . Dyspnea   . Dysrhythmia    tachycardia  . ESRD (end stage renal disease) ([place])    [place]- [place]  . GI bleeding 12/2017  . Gout   . Headache(784.0)    "related to chemo; sometimes weekly" (09/12/2013)  . High cholesterol   . History of blood transfusion "a couple"   "related to low counts"  . Hypertension   . Iron deficiency anemia    "get epogen shots q month" (02/20/2014)  . 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 Peritoneal dialysis status ([place])   . Stroke [facility]) 08/15/2017    Patient Active Problem  List   Diagnosis Date Noted  . Acute blood loss anemia 05/21/2018  . GI bleed 05/20/2018  . ESRD on peritoneal dialysis ([place]) 04/01/2016  . Anemia due to chronic kidney disease 04/01/2016  . GERD (gastroesophageal reflux disease) 04/01/2016  . Nephrotic syndrome 10/02/2013  . Dyslipidemia 10/02/2013  . PAT (paroxysmal atrial tachycardia) ([place]) 09/29/2013  . Normocytic anemia 06/29/2013  . Glomerulonephritis 01/28/2013  . Morbid obesity ([place]) 07/04/2011  . HTN (hypertension) 07/04/2011  . Hypercholesterolemia 07/04/2011    Past Surgical History:  Procedure Laterality Date  . ABDOMINAL HYSTERECTOMY  2010   "laparoscopic"  . ANKLE FRACTURE SURGERY Right 1994  . AV FISTULA PLACEMENT Left 04/14/2016   Procedure: ARTERIOVENOUS (AV) FISTULA CREATION;  Surgeon: Angelia Mould, MD;  Location: [facility];  Service: Vascular;  Laterality: Left;  . AV FISTULA PLACEMENT Left 08/09/2017   Procedure: INSERTION OF ARTERIOVENOUS (AV) GORE-TEX GRAFT LEFT ARM;  Surgeon: Elam Dutch, MD;  Location: [place];  Service: Vascular;  Laterality: Left;  . [place] REMOVAL Left 08/11/2017   Procedure: REMOVAL OF ARTERIOVENOUS GORETEX GRAFT ([place]);  Surgeon: Serafina Mitchell, MD;  Location: [facility] OR;  Service: Vascular;  Laterality: Left;  . BIOPSY  09/03/2017   Procedure:  BIOPSY;  Surgeon: Otis Brace, MD;  Location: [place];  Service: Gastroenterology;;  . BIOPSY  12/24/2017   Procedure: BIOPSY;  Surgeon: Irving Copas., MD;  Location: [place];  Service: Gastroenterology;;  . CARDIAC CATHETERIZATION  2000's  . COLONOSCOPY WITH PROPOFOL N/A 09/04/2017   Procedure: COLONOSCOPY WITH PROPOFOL;  Surgeon: Ronnette Juniper, MD;  Location: [place];  Service: Gastroenterology;  Laterality: N/A;  . ENTEROSCOPY N/A 12/24/2017   Procedure: ENTEROSCOPY;  Surgeon: Rush Landmark Telford Nab., MD;  Location: [place];  Service: Gastroenterology;  Laterality: N/A;  . ESOPHAGOGASTRODUODENOSCOPY    .  ESOPHAGOGASTRODUODENOSCOPY (EGD) WITH PROPOFOL Left 06/04/2017   Procedure: ESOPHAGOGASTRODUODENOSCOPY (EGD) WITH PROPOFOL;  Surgeon: Arta Silence, MD;  Location: [place];  Service: Endoscopy;  Laterality: Left;  . ESOPHAGOGASTRODUODENOSCOPY (EGD) WITH PROPOFOL N/A 09/03/2017   Procedure: ESOPHAGOGASTRODUODENOSCOPY (EGD) WITH PROPOFOL;  Surgeon: Otis Brace, MD;  Location: [place];  Service: Gastroenterology;  Laterality: N/A;  . FISTULA SUPERFICIALIZATION Left 04/02/2017   Procedure: FISTULA SUPERFICIALIZATION LEFT ARM;  Surgeon: Serafina Mitchell, MD;  Location: [place];  Service: Vascular;  Laterality: Left;  . FISTULOGRAM Left 04/07/2016   Procedure: FISTULOGRAM;  Surgeon: Waynetta Sandy, MD;  Location: [place];  Service: Vascular;  Laterality: Left;  . FRACTURE SURGERY     right ankle  . GIVENS CAPSULE STUDY N/A 10/29/2017   Procedure: GIVENS CAPSULE STUDY;  Surgeon: Wilford Corner, MD;  Location: [place];  Service: Endoscopy;  Laterality: N/A;  . GIVENS CAPSULE STUDY N/A 12/24/2017   Procedure: GIVENS CAPSULE STUDY;  Surgeon: Irving Copas., MD;  Location: [place];  Service: Gastroenterology;  Laterality: N/A;  . GIVENS CAPSULE STUDY N/A 05/22/2018   Procedure: GIVENS CAPSULE STUDY;  Surgeon: Irving Copas., MD;  Location: [place];  Service: Gastroenterology;  Laterality: N/A;  . INSERTION OF DIALYSIS CATHETER Right 04/07/2016   Procedure: INSERTION OF DIALYSIS CATHETER;  Surgeon: Waynetta Sandy, MD;  Location: [place];  Service: Vascular;  Laterality: Right;  . INSERTION OF DIALYSIS CATHETER Right 04/04/2017   Procedure: INSERTION OF DIALYSIS CATHETER;  Surgeon: Waynetta Sandy, MD;  Location: [place];  Service: Vascular;  Laterality: Right;  . LIGATION OF ARTERIOVENOUS  FISTULA Left 04/14/2016   Procedure: LIGATION OF ARTERIOVENOUS  FISTULA;  Surgeon: Angelia Mould, MD;  Location: [place];  Service: Vascular;   Laterality: Left;  . PATCH ANGIOPLASTY Left 06/19/2016   Procedure: PATCH ANGIOPLASTY LEFT ARTERIOVENOUS FISTULA;  Surgeon: Angelia Mould, MD;  Location: [place];  Service: Vascular;  Laterality: Left;  . POLYPECTOMY  09/04/2017   Procedure: POLYPECTOMY;  Surgeon: Ronnette Juniper, MD;  Location: [facility] ENDOSCOPY;  Service: Gastroenterology;;  . REVISON OF ARTERIOVENOUS FISTULA Left 06/19/2016   Procedure: REVISION SUPERFICIALIZATION OF BRACHIOCEPHALIC ARTERIOVENOUS FISTULA;  Surgeon: Angelia Mould, MD;  Location: [facility] OR;  Service: Vascular;  Laterality: Left;    OB History    Gravida  3   Para      Term      Preterm      AB      Living        SAB      TAB      Ectopic      Multiple  3   Live Births               Home Medications    Prior to Admission medications   Medication Sig Start Date End Date Taking? Authorizing Provider  allopurinol (ZYLOPRIM) 100 MG tablet  Take 100 mg by mouth at bedtime.     [provider]  ALPRAZolam Duanne Moron) 1 MG tablet Take 1 mg by mouth 2 (two) times daily as needed for anxiety.  03/19/17   [provider]  atorvastatin (LIPITOR) 40 MG tablet Take 40 mg by mouth at bedtime.  03/14/14   [provider]  calcitRIOL (ROCALTROL) 0.5 MCG capsule Take 1 capsule (0.5 mcg total) by mouth daily with breakfast. Patient taking differently: Take 0.5 mcg by mouth at bedtime.  06/04/17   Bonnielee Haff, MD  doxycycline (VIBRAMYCIN) 100 MG capsule Take 1 capsule (100 mg total) by mouth 2 (two) times daily. One po bid x 7 days 07/07/18   Mesner, Corene Cornea, MD  gentamicin ointment (GARAMYCIN) 0.1 % Apply 1 application topically daily as needed (changing dressing.).    [provider]  HYDROcodone-homatropine (HYCODAN) 5-1.5 MG/5ML syrup Take 5 mLs by mouth every 6 (six) hours as needed for cough. 07/07/18   Mesner, Corene Cornea, MD  hydrOXYzine (ATARAX/VISTARIL) 25 MG tablet Take 25 mg by mouth every 6 (six) hours as needed for  itching.  01/25/18   [provider]  methocarbamol (ROBAXIN) 500 MG tablet Take 1 tablet (500 mg total) by mouth every 8 (eight) hours as needed for muscle spasms. 07/07/18   Mesner, Corene Cornea, MD  metoprolol tartrate (LOPRESSOR) 25 MG tablet Take 25 mg by mouth at bedtime.  04/17/16   [provider]  multivitamin (RENA-VIT) TABS tablet Take 1 tablet by mouth at bedtime.     [provider]  omeprazole (PRILOSEC) 40 MG capsule Take 40 mg by mouth 2 (two) times daily.    [provider]  prochlorperazine (COMPAZINE) 10 MG tablet Take 1 tablet (10 mg total) by mouth every 8 (eight) hours as needed for nausea or vomiting. 02/17/18   Mansouraty, Telford Nab., MD  promethazine (PHENERGAN) 25 MG tablet Take 1 tablet (25 mg total) by mouth every 8 (eight) hours as needed for nausea or vomiting. 04/07/18   Shirley, [place], DO  sucroferric oxyhydroxide (VELPHORO) 500 MG chewable tablet Chew 1,000 mg by mouth 3 (three) times daily before meals.     [provider]  venlafaxine XR (EFFEXOR-XR) 75 MG 24 hr capsule Take 75 mg by mouth at bedtime.     [provider]  zolpidem (AMBIEN) 5 MG tablet Take 5 mg by mouth at bedtime. 05/17/18 05/17/19  [provider]    Family History Family History  Problem Relation Age of Onset  . Hypertension Mother   . Thyroid disease Mother   . Coronary artery disease Father   . Hypertension Father   . Diabetes Father   . Colon cancer Maternal Aunt   . Esophageal cancer Maternal Uncle   . Inflammatory bowel disease Neg Hx   . Liver disease Neg Hx   . Pancreatic cancer Neg Hx   . Rectal cancer Neg Hx   . Stomach cancer Neg Hx     Social History Social History   Tobacco Use  . Smoking status: Never Smoker  . Smokeless tobacco: Never Used  Substance Use Topics  . Alcohol use: No  . Drug use: No     Allergies   Nsaids and Tolmetin   Review of Systems Review of Systems  Constitutional: Negative for  chills, fatigue and fever.  HENT: Negative for ear pain and sore throat.   Eyes: Negative for pain and visual disturbance.  Respiratory: Positive for cough, chest tightness and shortness of breath.  Negative for wheezing.   Cardiovascular: Negative for chest pain and palpitations.  Gastrointestinal: Negative for abdominal pain and vomiting.  Genitourinary: Negative for dysuria and hematuria.  Musculoskeletal: Positive for myalgias. Negative for arthralgias and back pain.  Skin: Negative for color change and rash.  Neurological: Negative for seizures and syncope.  All other systems reviewed and are negative.    Physical Exam Triage Vital Signs ED Triage Vitals  Enc Vitals Group     BP 07/10/18 1644 (!) 145/101     Pulse Rate 07/10/18 1644 83     Resp 07/10/18 1644 19     Temp 07/10/18 1644 98.2 F (36.8 C)     Temp Source 07/10/18 1644 Oral     SpO2 07/10/18 1644 97 %     Weight --      Height --      Head Circumference --      Peak Flow --      Pain Score 07/10/18 1642 6     Pain Loc --      Pain Edu? --      Excl. in GC? --    No data found.  Updated Vital Signs BP (!) 145/101 (BP Location: Right Arm)   Pulse 83   Temp 98.2 F (36.8 C) (Oral)   Resp 19   SpO2 97%     Physical Exam Constitutional:      General: She is not in acute distress.    Appearance: She is well-developed. She is obese. She is not toxic-appearing or diaphoretic.     Comments: Moderately ill-appearing  HENT:     Head: Normocephalic and atraumatic.     Nose: Nose normal. No congestion.     Mouth/Throat:     Mouth: Mucous membranes are moist.     Pharynx: No posterior oropharyngeal erythema.  Eyes:     Conjunctiva/sclera: Conjunctivae normal.     Pupils: Pupils are equal, round, and reactive to light.  Neck:     Musculoskeletal: Normal range of motion.  Cardiovascular:     Rate and Rhythm: Normal rate and regular rhythm.     Heart sounds: Normal heart sounds.  Pulmonary:     Effort:  Pulmonary effort is normal. No respiratory distress.     Breath sounds: Normal breath sounds.     Comments: No wheezing, rales, rhonchi. Abdominal:     General: There is no distension.     Palpations: Abdomen is soft.  Musculoskeletal: Normal range of motion.  Skin:    General: Skin is warm and dry.  Neurological:     General: No focal deficit present.     Mental Status: She is alert.  Psychiatric:        Behavior: Behavior normal.      UC Treatments / Results  Labs (all labs ordered are listed, but only abnormal results are displayed) Labs Reviewed - No data to display  EKG   Radiology No results found.  Procedures Procedures (including critical care time)  Medications Ordered in UC Medications - No data to display  Initial Impression / Assessment and Plan / UC Course  I have reviewed the triage vital signs and the nursing notes.  Pertinent labs & imaging results that were available during my care of the patient were reviewed by me and considered in my medical decision making (see chart for details).     Explained to the patient that although she is being covered with a antibiotic, she likely has a viral  bronchitis.  These do take a while to resolve.  Her lungs are clear.  Oxygen level is good.  I feel like it safe for her to go home.  Is advised to come back to the emergency room if worse instead of better. Final Clinical Impressions(s) / UC Diagnoses   Final diagnoses:  Acute bronchitis, unspecified organism     Discharge Instructions     Your lungs sound clear.  Even though you feel like you are short of breath your oxygen level is good. Drink plenty of fluids You probably need to sleep in a recliner sleep upright for a few days until this gets better These type of bronchitis infections can take a while to improve. If you get suddenly worse instead of better, more shortness of breath, high fevers, vomiting, or dizziness then you may need to go back to the  emergency room   ED Prescriptions    None     Controlled Substance Prescriptions [place] Controlled Substance Registry consulted? Not Applicable   Raylene Everts, MD 07/10/18 [phone]

## 2018-07-10 NOTE — Discharge Instructions (Addendum)
Your lungs sound clear.  Even though you feel like you are short of breath your oxygen level is good. Drink plenty of fluids You probably need to sleep in a recliner sleep upright for a few days until this gets better These type of bronchitis infections can take a while to improve. If you get suddenly worse instead of better, more shortness of breath, high fevers, vomiting, or dizziness then you may need to go back to the emergency room

## 2018-07-10 NOTE — ED Triage Notes (Signed)
Patient presents to Urgent Care with complaints of chest pains and difficulty breathing since last week. Patient reports she was seen at the ED for same a few days ago and her COVID was negative, diagnosed with bronchitis and given meds. Pt states no improvement with meds.

## 2018-07-11 ENCOUNTER — Emergency Department (HOSPITAL_COMMUNITY): Payer: Medicare Other

## 2018-07-11 ENCOUNTER — Encounter (HOSPITAL_COMMUNITY): Payer: Self-pay | Admitting: Emergency Medicine

## 2018-07-11 LAB — CBC WITH DIFFERENTIAL/PLATELET
Abs Immature Granulocytes: 0.05 10*3/uL (ref 0.00–0.07)
Basophils Absolute: 0.1 10*3/uL (ref 0.0–0.1)
Basophils Relative: 1 %
Eosinophils Absolute: 0.2 10*3/uL (ref 0.0–0.5)
Eosinophils Relative: 2 %
HCT: 26.3 % — ABNORMAL LOW (ref 36.0–46.0)
Hemoglobin: 8.5 g/dL — ABNORMAL LOW (ref 12.0–15.0)
Immature Granulocytes: 0 %
Lymphocytes Relative: 21 %
Lymphs Abs: 2.3 10*3/uL (ref 0.7–4.0)
MCH: 28.9 pg (ref 26.0–34.0)
MCHC: 32.3 g/dL (ref 30.0–36.0)
MCV: 89.5 fL (ref 80.0–100.0)
Monocytes Absolute: 0.9 10*3/uL (ref 0.1–1.0)
Monocytes Relative: 8 %
Neutro Abs: 7.7 10*3/uL (ref 1.7–7.7)
Neutrophils Relative %: 68 %
Platelets: 275 10*3/uL (ref 150–400)
RBC: 2.94 MIL/uL — ABNORMAL LOW (ref 3.87–5.11)
RDW: 14.9 % (ref 11.5–15.5)
WBC: 11.2 10*3/uL — ABNORMAL HIGH (ref 4.0–10.5)
nRBC: 0 % (ref 0.0–0.2)

## 2018-07-11 LAB — COMPREHENSIVE METABOLIC PANEL
ALT: 29 U/L (ref 0–44)
AST: 34 U/L (ref 15–41)
Albumin: 3.3 g/dL — ABNORMAL LOW (ref 3.5–5.0)
Alkaline Phosphatase: 52 U/L (ref 38–126)
Anion gap: 22 — ABNORMAL HIGH (ref 5–15)
BUN: 71 mg/dL — ABNORMAL HIGH (ref 6–20)
CO2: 22 mmol/L (ref 22–32)
Calcium: 8.6 mg/dL — ABNORMAL LOW (ref 8.9–10.3)
Chloride: 93 mmol/L — ABNORMAL LOW (ref 98–111)
Creatinine, Ser: 17.95 mg/dL — ABNORMAL HIGH (ref 0.44–1.00)
GFR calc Af Amer: 2 mL/min — ABNORMAL LOW (ref >60)
GFR calc non Af Amer: 2 mL/min — ABNORMAL LOW (ref >60)
Glucose, Bld: 103 mg/dL — ABNORMAL HIGH (ref 70–99)
Potassium: 3.9 mmol/L (ref 3.5–5.1)
Sodium: 137 mmol/L (ref 135–145)
Total Bilirubin: 1.2 mg/dL (ref 0.3–1.2)
Total Protein: 6.4 g/dL — ABNORMAL LOW (ref 6.5–8.1)

## 2018-07-11 LAB — I-STAT BETA HCG BLOOD, ED (MC, WL, AP ONLY): I-stat hCG, quantitative: <5 m[IU]/mL (ref <5)

## 2018-07-11 LAB — TROPONIN I (HIGH SENSITIVITY)
Troponin I (High Sensitivity): 10 ng/L (ref <18)
Troponin I (High Sensitivity): 12 ng/L (ref <18)

## 2018-07-11 LAB — BRAIN NATRIURETIC PEPTIDE: B Natriuretic Peptide: 74.6 pg/mL (ref 0.0–100.0)

## 2018-07-11 MED ORDER — ALBUTEROL SULFATE HFA 108 (90 BASE) MCG/ACT IN AERS: 2.0000 | INHALATION_SPRAY | Freq: Once | RESPIRATORY_TRACT | Status: DC

## 2018-07-11 NOTE — ED Provider Notes (Signed)
[facility]ERGENCY DEPARTMENT Provider Note   CSN: [id number] Arrival date & time: 07/10/18  2349    History   Chief Complaint Chief Complaint  Patient presents with  . Shortness of Breath    HPI Jenille Rojero is a 43 y.o. female past medical history of end-stage renal disease, GI bleed, hypertension who presents for evaluation of progressively worsening shortness of breath.  Patient reports that on 07/04/18, she had what sounds like an endoscopy done at [facility].  She reports it was an outpatient procedure.  She reports that she went home and was having some shortness of breath.  She was seen in the ED and evaluated on 07/06/18 and was discharged home with antibiotics, and occasions for cough and Robaxin.  She reports that she continued to have some symptoms.  Was seen at urgent care today and was discharged.  She reports that she went home and felt like her symptoms were getting worse, prompting ED visit.  She states that shortness of breath is worse when she attempts to lay down flat.  She also reports feeling a heaviness across her chest that she states is worse when she lays down flat.  If she sits up straight, she does not feel like she has much trouble breathing.  She states she is has had a cough that has been nonproductive.  She states she has had some generalized body aches but has not noted any documented fevers.  She states she has had some decreased appetite.  She has not had any abdominal pain, nausea/vomiting.  Does report that she does peritoneal dialysis.  Her last session was earlier today prior to coming when she took 3.2 L off.  She states that is a little bit much for her.  She states that she has not had any travel and denies any known COVID-19 exposure.  She is not currently on OCPs and denies any asymmetric leg swelling, history of DVTs, recent hospitalizations.  No history of COPD or asthma.     The history is provided by the patient.    Past Medical  History:  Diagnosis Date  . Acid reflux   . Anxiety   . Arthritis   . Dyspnea   . Dysrhythmia    tachycardia  . ESRD (end stage renal disease) ([place])    [facility]- [place]  . GI bleeding 12/2017  . Gout   . Headache(784.0)    "related to chemo; sometimes weekly" (09/12/2013)  . High cholesterol   . History of blood transfusion "a couple"   "related to low counts"  . Hypertension   . Iron deficiency anemia    "get epogen shots q month" (02/20/2014)  . 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 Peritoneal dialysis status ([place])   . Stroke [facility]) 08/15/2017    Patient Active Problem List   Diagnosis Date Noted  . Acute blood loss anemia 05/21/2018  . GI bleed 05/20/2018  . ESRD on peritoneal dialysis ([place]) 04/01/2016  . Anemia due to chronic kidney disease 04/01/2016  . GERD (gastroesophageal reflux disease) 04/01/2016  . Nephrotic syndrome 10/02/2013  . Dyslipidemia 10/02/2013  . PAT (paroxysmal atrial tachycardia) ([place]) 09/29/2013  . Normocytic anemia 06/29/2013  . Glomerulonephritis 01/28/2013  . Morbid obesity ([place]) 07/04/2011  . HTN (hypertension) 07/04/2011  . Hypercholesterolemia 07/04/2011    Past Surgical History:  Procedure Laterality Date  . ABDOMINAL HYSTERECTOMY  2010   "laparoscopic"  .  ANKLE FRACTURE SURGERY Right 1994  . AV FISTULA PLACEMENT Left 04/14/2016   Procedure: ARTERIOVENOUS (AV) FISTULA CREATION;  Surgeon: Angelia Mould, MD;  Location: [facility] OR;  Service: Vascular;  Laterality: Left;  . AV FISTULA PLACEMENT Left 08/09/2017   Procedure: INSERTION OF ARTERIOVENOUS (AV) GORE-TEX GRAFT LEFT ARM;  Surgeon: Elam Dutch, MD;  Location: [place];  Service: Vascular;  Laterality: Left;  . [place] REMOVAL Left 08/11/2017   Procedure: REMOVAL OF ARTERIOVENOUS GORETEX GRAFT ([place]);  Surgeon: Serafina Mitchell, MD;  Location: [facility] OR;  Service: Vascular;  Laterality: Left;  . BIOPSY  09/03/2017   Procedure:  BIOPSY;  Surgeon: Otis Brace, MD;  Location: [place];  Service: Gastroenterology;;  . BIOPSY  12/24/2017   Procedure: BIOPSY;  Surgeon: Irving Copas., MD;  Location: [place];  Service: Gastroenterology;;  . CARDIAC CATHETERIZATION  2000's  . COLONOSCOPY WITH PROPOFOL N/A 09/04/2017   Procedure: COLONOSCOPY WITH PROPOFOL;  Surgeon: Ronnette Juniper, MD;  Location: [place];  Service: Gastroenterology;  Laterality: N/A;  . ENTEROSCOPY N/A 12/24/2017   Procedure: ENTEROSCOPY;  Surgeon: Rush Landmark Telford Nab., MD;  Location: [place];  Service: Gastroenterology;  Laterality: N/A;  . ESOPHAGOGASTRODUODENOSCOPY    . ESOPHAGOGASTRODUODENOSCOPY (EGD) WITH PROPOFOL Left 06/04/2017   Procedure: ESOPHAGOGASTRODUODENOSCOPY (EGD) WITH PROPOFOL;  Surgeon: Arta Silence, MD;  Location: [place];  Service: Endoscopy;  Laterality: Left;  . ESOPHAGOGASTRODUODENOSCOPY (EGD) WITH PROPOFOL N/A 09/03/2017   Procedure: ESOPHAGOGASTRODUODENOSCOPY (EGD) WITH PROPOFOL;  Surgeon: Otis Brace, MD;  Location: [place];  Service: Gastroenterology;  Laterality: N/A;  . FISTULA SUPERFICIALIZATION Left 04/02/2017   Procedure: FISTULA SUPERFICIALIZATION LEFT ARM;  Surgeon: Serafina Mitchell, MD;  Location: [place];  Service: Vascular;  Laterality: Left;  . FISTULOGRAM Left 04/07/2016   Procedure: FISTULOGRAM;  Surgeon: Waynetta Sandy, MD;  Location: [place];  Service: Vascular;  Laterality: Left;  . FRACTURE SURGERY     right ankle  . GIVENS CAPSULE STUDY N/A 10/29/2017   Procedure: GIVENS CAPSULE STUDY;  Surgeon: Wilford Corner, MD;  Location: [place];  Service: Endoscopy;  Laterality: N/A;  . GIVENS CAPSULE STUDY N/A 12/24/2017   Procedure: GIVENS CAPSULE STUDY;  Surgeon: Irving Copas., MD;  Location: [place];  Service: Gastroenterology;  Laterality: N/A;  . GIVENS CAPSULE STUDY N/A 05/22/2018   Procedure: GIVENS CAPSULE STUDY;  Surgeon: Irving Copas., MD;  Location: [place];  Service: Gastroenterology;  Laterality: N/A;  . INSERTION OF DIALYSIS CATHETER Right 04/07/2016   Procedure: INSERTION OF DIALYSIS CATHETER;  Surgeon: Waynetta Sandy, MD;  Location: [place];  Service: Vascular;  Laterality: Right;  . INSERTION OF DIALYSIS CATHETER Right 04/04/2017   Procedure: INSERTION OF DIALYSIS CATHETER;  Surgeon: Waynetta Sandy, MD;  Location: [place];  Service: Vascular;  Laterality: Right;  . LIGATION OF ARTERIOVENOUS  FISTULA Left 04/14/2016   Procedure: LIGATION OF ARTERIOVENOUS  FISTULA;  Surgeon: Angelia Mould, MD;  Location: [place];  Service: Vascular;  Laterality: Left;  . PATCH ANGIOPLASTY Left 06/19/2016   Procedure: PATCH ANGIOPLASTY LEFT ARTERIOVENOUS FISTULA;  Surgeon: Angelia Mould, MD;  Location: [place];  Service: Vascular;  Laterality: Left;  . POLYPECTOMY  09/04/2017   Procedure: POLYPECTOMY;  Surgeon: Ronnette Juniper, MD;  Location: [facility] ENDOSCOPY;  Service: Gastroenterology;;  . REVISON OF ARTERIOVENOUS FISTULA Left 06/19/2016   Procedure: REVISION SUPERFICIALIZATION OF BRACHIOCEPHALIC ARTERIOVENOUS FISTULA;  Surgeon: Angelia Mould, MD;  Location: [place];  Service: Vascular;  Laterality: Left;  OB History    Gravida  3   Para      Term      Preterm      AB      Living        SAB      TAB      Ectopic      Multiple  3   Live Births               Home Medications    Prior to Admission medications   Medication Sig Start Date End Date Taking? Authorizing Provider  allopurinol (ZYLOPRIM) 100 MG tablet Take 100 mg by mouth at bedtime.     [provider]  ALPRAZolam Duanne Moron) 1 MG tablet Take 1 mg by mouth 2 (two) times daily as needed for anxiety.  03/19/17   [provider]  atorvastatin (LIPITOR) 40 MG tablet Take 40 mg by mouth at bedtime.  03/14/14   [provider]  calcitRIOL (ROCALTROL) 0.5 MCG capsule Take 1 capsule (0.5 mcg total) by  mouth daily with breakfast. Patient taking differently: Take 0.5 mcg by mouth at bedtime.  06/04/17   Bonnielee Haff, MD  doxycycline (VIBRAMYCIN) 100 MG capsule Take 1 capsule (100 mg total) by mouth 2 (two) times daily. One po bid x 7 days 07/07/18   Mesner, Corene Cornea, MD  gentamicin ointment (GARAMYCIN) 0.1 % Apply 1 application topically daily as needed (changing dressing.).    [provider]  HYDROcodone-homatropine (HYCODAN) 5-1.5 MG/5ML syrup Take 5 mLs by mouth every 6 (six) hours as needed for cough. 07/07/18   Mesner, Corene Cornea, MD  hydrOXYzine (ATARAX/VISTARIL) 25 MG tablet Take 25 mg by mouth every 6 (six) hours as needed for itching.  01/25/18   [provider]  methocarbamol (ROBAXIN) 500 MG tablet Take 1 tablet (500 mg total) by mouth every 8 (eight) hours as needed for muscle spasms. 07/07/18   Mesner, Corene Cornea, MD  metoprolol tartrate (LOPRESSOR) 25 MG tablet Take 25 mg by mouth at bedtime.  04/17/16   [provider]  multivitamin (RENA-VIT) TABS tablet Take 1 tablet by mouth at bedtime.     [provider]  omeprazole (PRILOSEC) 40 MG capsule Take 40 mg by mouth 2 (two) times daily.    [provider]  prochlorperazine (COMPAZINE) 10 MG tablet Take 1 tablet (10 mg total) by mouth every 8 (eight) hours as needed for nausea or vomiting. 02/17/18   Mansouraty, Telford Nab., MD  promethazine (PHENERGAN) 25 MG tablet Take 1 tablet (25 mg total) by mouth every 8 (eight) hours as needed for nausea or vomiting. 04/07/18   Shirley, [place], DO  sucroferric oxyhydroxide (VELPHORO) 500 MG chewable tablet Chew 1,000 mg by mouth 3 (three) times daily before meals.     [provider]  venlafaxine XR (EFFEXOR-XR) 75 MG 24 hr capsule Take 75 mg by mouth at bedtime.     [provider]  zolpidem (AMBIEN) 5 MG tablet Take 5 mg by mouth at bedtime. 05/17/18 05/17/19  [provider]    Family History Family History  Problem Relation Age of Onset  .  Hypertension Mother   . Thyroid disease Mother   . Coronary artery disease Father   . Hypertension Father   . Diabetes Father   . Colon cancer Maternal Aunt   . Esophageal cancer Maternal Uncle   . Inflammatory bowel disease Neg Hx   . Liver disease Neg Hx   . Pancreatic cancer Neg Hx   .  Rectal cancer Neg Hx   . Stomach cancer Neg Hx     Social History Social History   Tobacco Use  . Smoking status: Never Smoker  . Smokeless tobacco: Never Used  Substance Use Topics  . Alcohol use: No  . Drug use: No     Allergies   Nsaids and Tolmetin   Review of Systems Review of Systems  Constitutional: Negative for fever.  Respiratory: Positive for cough and shortness of breath.   Cardiovascular: Negative for chest pain.  Gastrointestinal: Negative for abdominal pain, nausea and vomiting.  Genitourinary: Negative for dysuria and hematuria.  Musculoskeletal: Positive for myalgias.  Neurological: Negative for headaches.  All other systems reviewed and are negative.    Physical Exam Updated Vital Signs BP 129/64 (BP Location: Right Arm)   Pulse 89   Temp 98.9 F (37.2 C) (Oral)   Resp 18   SpO2 100%   Physical Exam Vitals signs and nursing note reviewed.  Constitutional:      Appearance: Normal appearance. She is well-developed.  HENT:     Head: Normocephalic and atraumatic.  Eyes:     General: Lids are normal.     Conjunctiva/sclera: Conjunctivae normal.     Pupils: Pupils are equal, round, and reactive to light.  Neck:     Musculoskeletal: Full passive range of motion without pain.  Cardiovascular:     Rate and Rhythm: Normal rate and regular rhythm.     Pulses: Normal pulses.          Radial pulses are 2+ on the right side and 2+ on the left side.       Dorsalis pedis pulses are 2+ on the right side and 2+ on the left side.     Heart sounds: Normal heart sounds. No murmur. No friction rub. No gallop.   Pulmonary:     Effort: Pulmonary effort is normal.      Breath sounds: Normal breath sounds.     Comments: Lungs clear to auscultation bilaterally.  Symmetric chest rise.  No wheezing, rales, rhonchi.  No evidence of respiratory distress. Abdominal:     Palpations: Abdomen is soft. Abdomen is not rigid.     Tenderness: There is no abdominal tenderness. There is no guarding.     Comments: Abdomen is soft, non-distended, non-tender. No rigidity, No guarding. No peritoneal signs.  Musculoskeletal: Normal range of motion.     Comments: Bilateral lower extremities are symmetric in appearance without any overlying warmth, erythema, edema.  Skin:    General: Skin is warm and dry.     Capillary Refill: Capillary refill takes less than 2 seconds.  Neurological:     Mental Status: She is alert and oriented to person, place, and time.  Psychiatric:        Speech: Speech normal.      ED Treatments / Results  Labs (all labs ordered are listed, but only abnormal results are displayed) Labs Reviewed  COMPREHENSIVE METABOLIC PANEL - Abnormal; Notable for the following components:      Result Value   Chloride 93 (*)    Glucose, Bld 103 (*)    BUN 71 (*)    Creatinine, Ser 17.95 (*)    Calcium 8.6 (*)    Total Protein 6.4 (*)    Albumin 3.3 (*)    GFR calc non Af Amer 2 (*)    GFR calc Af Amer 2 (*)    Anion gap 22 (*)    All other  components within normal limits  CBC WITH DIFFERENTIAL/PLATELET - Abnormal; Notable for the following components:   WBC 11.2 (*)    RBC 2.94 (*)    Hemoglobin 8.5 (*)    HCT 26.3 (*)    All other components within normal limits  BRAIN NATRIURETIC PEPTIDE  TROPONIN I (HIGH SENSITIVITY)  TROPONIN I (HIGH SENSITIVITY)  I-STAT BETA HCG BLOOD, ED ([facility], [facility], [facility] ONLY)    EKG EKG Interpretation  Date/Time:  $RemoveBef'Sunday July 10 2018 23:59:47 EDT Ventricular Rate:  76 PR Interval:  152 QRS Duration: 88 QT Interval:  412 QTC Calculation: 463 R Axis:   19 Text Interpretation:  Normal sinus rhythm Cannot rule out Anterior  infarct , age undetermined Abnormal ECG No significant change since last tracing Confirmed by Cardama, Pedro ([id number]) on 07/11/2018 3:08:46 AM   Radiology Dg Chest 2 View  Result Date: 07/11/2018 CLINICAL DATA:  42 y/o  F; shortness of breath. EXAM: CHEST - 2 VIEW COMPARISON:  07/06/2018 chest radiograph FINDINGS: Stable borderline enlarged cardiac silhouette. Both lungs are clear. The visualized skeletal structures are unremarkable. IMPRESSION: Stable borderline cardiomegaly. No acute pulmonary process identified. Electronically Signed   By: Lance  Furusawa-Stratton M.D.   On: 07/11/2018 01:44    Procedures Procedures (including critical care time)  Medications Ordered in ED Medications  albuterol (VENTOLIN HFA) 108 (90 Base) MCG/ACT inhaler 2 puff (has no administration in time range)     Initial Impression / Assessment and Plan / ED Course  I have reviewed the triage vital signs and the nursing notes.  Pertinent labs & imaging results that were available during my care of the patient were reviewed by me and considered in my medical decision making (see chart for details).        42'[id number]$  year old female past medical history of end-stage renal disease who is on peritoneal dialysis who presents for evaluation of shortness of breath.  Reports having an endoscopy at [facility] for sounds like AVMs.  Since then has had some shortness of breath.  Seen here on 07/06/18 for evaluation of same symptoms.  Was discharged home on doxycycline, Robaxin, cough syrup and albuterol inhaler.  Ports she has been taking those.  No fevers.  She reports that when she lays down flat, feels she feels like her shortness of breath is worse and also feels like she has some chest heaviness.  This only occurs when she lays flat.  Initial ED arrival, she is afebrile.  Vitals are stable with no signs of hypoxia or tachycardia.  She is slightly hypertensive.  On exam, lungs are clear to auscultation bilaterally.  She has good air  movement with no signs of wheezing.  No evidence of respiratory distress.  She is able to speak in full sentences without any difficulty.  Bilateral lower extremities appear symmetric in appearance without any overlying warmth, erythema, edema.  Consider infectious process versus fluid overload.  Doubt ACS etiology given her atypical presentation but a consideration.  Additionally, her history/physical exam is not concerning for PE.  Her surgery was an outpatient procedure and she had no inpatient admission.  She otherwise has no PE risk factors and is not hypoxic or tachycardic.  Additionally, low suspicion for COVID-19.  She has been tested 3 times in the last 7 days and each time has been negative.  Will plan for chest x-rays, labs.  Chest x-ray shows stable borderline cardiomegaly.  No evidence of acute infectious etiology.  Beta hCG negative.  CBC shows leukocytosis of  11.2.  Slightly bumped up from previous visit.  Hemoglobin is 8.5 which is consistent with her.  CMP shows BUN of 71 and creatinine is 17.95.  This is consistent with patient's baseline.  Potassium is 3.9.  Troponin is negative.  BNP is negative.  Evaluation.  Patient still with good lung sounds.  No evidence of wheezing or respiratory distress.  Vitals are stable.  We will plan to ambulate her and check O2 sats.  Patient able to ambulate in the ED with no signs of tachycardia or hypoxia.  Repeat evaluation.  Patient is asking to go home.  I discussed with patient that her work-up here is reassuring.  Given negative chest x-ray, do not feel repeat COVID testing is necessary.  I discussed with patient regarding continue taking those medications.  We will plan to give her albuterol inhaler to go home with. At this time, patient exhibits no emergent life-threatening condition that require further evaluation in ED or admission. Discussed patient with Dr. Leonette Monarch who is in agreement.  Patient had ample opportunity for questions and discussion.  All patient's questions were answered with full understanding. Strict return precautions discussed. Patient expresses understanding and agreement to plan.   Portions of this note were generated with Lobbyist. Dictation errors may occur despite best attempts at proofreading.     Final Clinical Impressions(s) / ED Diagnoses   Final diagnoses:  SOB (shortness of breath)  Viral illness    ED Discharge Orders    None       Desma Mcgregor 07/11/18 [id number]    Fatima Blank, MD 07/11/18 [phone]

## 2018-07-11 NOTE — ED Triage Notes (Signed)
Pt presents with worsening shob today.  Pt had procedure done Tuesday, was seen here Wednesday then at [facility] today for [facility], while attempting to do CAPD today at home pt felt overloaded with fluid and felt she needed to come get checked.  Fever 100.1 today. Covid neg x 3 in last 7 days.

## 2018-07-11 NOTE — Discharge Instructions (Signed)
As we discussed, your work-up and chest x-ray are reassuring today.  Continue taking the medications prescribed and your previous ED visit.  Return to the emergency department for any worsening difficulty breathing, fevers, chest pain or any other worsening or concerning symptoms.

## 2018-07-11 NOTE — ED Notes (Signed)
Patient ambulated in hallway without assitance

## 2018-07-11 NOTE — ED Notes (Signed)
The pt has had some breathing difficulty since last Wednesday when she came to this ed  Cough non-productive   She reports that she had a temp  And last took tylenol at 1300  No distress at present

## 2018-07-11 NOTE — ED Notes (Signed)
No respiratory distress

## 2018-07-15 ENCOUNTER — Telehealth: Payer: Self-pay | Admitting: Internal Medicine

## 2018-07-15 NOTE — Telephone Encounter (Signed)
Called patient for COVID-19 pre-screening for in office visit.  Have you recently traveled any where out of the local area in the last 2 weeks? No  Have you been in close contact with a person diagnosed with COVID-19 or someone awaiting results within the last 2 weeks? No  Do you currently have any of the following symptoms? If so, when did they start? Cough (Yes- 2 weeks)  Diarrhea              Joint Pain Fever      Muscle Pain   Red eyes Shortness of breath   Abdominal pain  Vomiting Loss of smell    Rash    Sore Throat Headache    Weakness   Bruising or bleeding    Okay to proceed with visit 07/15/2018

## 2018-07-18 ENCOUNTER — Encounter: Payer: Self-pay | Admitting: Internal Medicine

## 2018-07-18 ENCOUNTER — Ambulatory Visit (INDEPENDENT_AMBULATORY_CARE_PROVIDER_SITE_OTHER): Payer: Medicare Other | Admitting: Internal Medicine

## 2018-07-18 ENCOUNTER — Other Ambulatory Visit: Payer: Self-pay

## 2018-07-18 VITALS — BP 128/78 | HR 94 | Temp 98.1°F | Ht 67.0 in | Wt 264.8 lb

## 2018-07-18 DIAGNOSIS — G4719 Other hypersomnia: Secondary | ICD-10-CM

## 2018-07-18 NOTE — Patient Instructions (Signed)
Will send for sleep study.    Sleep Apnea    Sleep apnea is disorder that affects a person's sleep. A person with sleep apnea has abnormal pauses in their breathing when they sleep. It is hard for them to get a good sleep. This makes a person tired during the day. It also can lead to other physical problems. There are three types of sleep apnea. One type is when breathing stops for a short time because your airway is blocked (obstructive sleep apnea). Another type is when the brain sometimes fails to give the normal signal to breathe to the muscles that control your breathing (central sleep apnea). The third type is a combination of the other two types.  HOME CARE   Take all medicine as told by your doctor.  Avoid alcohol, calming medicines (sedatives), and depressant drugs.  Try to lose weight if you are overweight. Talk to your doctor about a healthy weight goal.  Your doctor may have you use a device that helps to open your airway. It can help you get the air that you need. It is called a positive airway pressure (PAP) device.   MAKE SURE YOU:   Understand these instructions.  Will watch your condition.  Will get help right away if you are not doing well or get worse.  It may take approximately 1 month for you to get used to wearing her CPAP every night.  Be sure to work with your machine to get used to it, be patient, it may take time!  If you have trouble tolerating CPAP DO NOT RETURN YOUR MACHINE; Contact our office to see if we can help you tolerate the CPAP better first!  

## 2018-07-18 NOTE — Progress Notes (Signed)
[facility] Consultation      Assessment and Plan:  Excessive daytime sleepiness - Symptoms and signs of obstructive sleep apnea, will send for sleep study.  Essential hypertension, stroke. - Above conditions can be contributed to by obstructive sleep apnea, therefore treatment of sleep apnea is an important part of their management.  Orders Placed This Encounter  Procedures  . Home sleep test   Return in about 3 months (around 10/18/2018).    Date: 07/18/2018  MRN# [id number] Patricia [name] [birth year]    Patricia [name] is a 43 y.o. old female seen in consultation for chief complaint of:    Chief Complaint  Patient presents with  . sleep consult    per [facility]- prior sleep study. neg for OSA. c/o daytime sleepiness, restless sleep, choking during sleep & loud snoring.     HPI:  Patricia [name] is a 43 y.o. old female her kids have said that she stops breathing when she is sleeping. She recently went an EGD and was told that she stopped breathing.  She is sleepy during the day.  She goes to bed 11-12, she does PD at home every night. She wakes at 10 am, does not feel rested. She can sleep all day if allowed to. She drives a car.  Denies sleep paralysis, sleep walking, denies jaw pain or TMJ, cataplexy. No dentures.   ** PSG 05/27/12>> mild OSA with AHI of 5.4    PMHX:   Past Medical History:  Diagnosis Date  . Acid reflux   . Anxiety   . Arthritis   . Dyspnea   . Dysrhythmia    tachycardia  . ESRD (end stage renal disease) ([place])    TTHSAT- [place]  . GI bleeding 12/2017  . Gout   . Headache(784.0)    "related to chemo; sometimes weekly" (09/12/2013)  . High cholesterol   . History of blood transfusion "a couple"   "related to low counts"  . Hypertension   . Iron deficiency anemia    "get epogen shots q month" (02/20/2014)  . MCGN (minimal change glomerulonephritis)    "using chemo to tx;  finished my last tx in 12/2013"   . Peritoneal dialysis status ([place])   . Stroke [facility]) 08/15/2017   Surgical Hx:  Past Surgical History:  Procedure Laterality Date  . ABDOMINAL HYSTERECTOMY  2010   "laparoscopic"  . ANKLE FRACTURE SURGERY Right 1994  . AV FISTULA PLACEMENT Left 04/14/2016   Procedure: ARTERIOVENOUS (AV) FISTULA CREATION;  Surgeon: Angelia Mould, MD;  Location: [facility] OR;  Service: Vascular;  Laterality: Left;  . AV FISTULA PLACEMENT Left 08/09/2017   Procedure: INSERTION OF ARTERIOVENOUS (AV) GORE-TEX GRAFT LEFT ARM;  Surgeon: Elam Dutch, MD;  Location: [place];  Service: Vascular;  Laterality: Left;  . [place] REMOVAL Left 08/11/2017   Procedure: REMOVAL OF ARTERIOVENOUS GORETEX GRAFT ([place]);  Surgeon: Serafina Mitchell, MD;  Location: [facility];  Service: Vascular;  Laterality: Left;  . BIOPSY  09/03/2017   Procedure: BIOPSY;  Surgeon: Otis Brace, MD;  Location: [place];  Service: Gastroenterology;;  . BIOPSY  12/24/2017   Procedure: BIOPSY;  Surgeon: Irving Copas., MD;  Location: [place];  Service: Gastroenterology;;  . CARDIAC CATHETERIZATION  2000's  . COLONOSCOPY WITH PROPOFOL N/A 09/04/2017   Procedure: COLONOSCOPY WITH PROPOFOL;  Surgeon: Ronnette Juniper, MD;  Location: [place];  Service: Gastroenterology;  Laterality: N/A;  . ENTEROSCOPY N/A 12/24/2017   Procedure: ENTEROSCOPY;  Surgeon: Rush Landmark,  Telford Nab., MD;  Location: [facility] ENDOSCOPY;  Service: Gastroenterology;  Laterality: N/A;  . ESOPHAGOGASTRODUODENOSCOPY    . ESOPHAGOGASTRODUODENOSCOPY (EGD) WITH PROPOFOL Left 06/04/2017   Procedure: ESOPHAGOGASTRODUODENOSCOPY (EGD) WITH PROPOFOL;  Surgeon: Arta Silence, MD;  Location: [place];  Service: Endoscopy;  Laterality: Left;  . ESOPHAGOGASTRODUODENOSCOPY (EGD) WITH PROPOFOL N/A 09/03/2017   Procedure: ESOPHAGOGASTRODUODENOSCOPY (EGD) WITH PROPOFOL;  Surgeon: Otis Brace, MD;  Location: [place];  Service: Gastroenterology;  Laterality: N/A;  . FISTULA  SUPERFICIALIZATION Left 04/02/2017   Procedure: FISTULA SUPERFICIALIZATION LEFT ARM;  Surgeon: Serafina Mitchell, MD;  Location: [place];  Service: Vascular;  Laterality: Left;  . FISTULOGRAM Left 04/07/2016   Procedure: FISTULOGRAM;  Surgeon: Waynetta Sandy, MD;  Location: [place];  Service: Vascular;  Laterality: Left;  . FRACTURE SURGERY     right ankle  . GIVENS CAPSULE STUDY N/A 10/29/2017   Procedure: GIVENS CAPSULE STUDY;  Surgeon: Wilford Corner, MD;  Location: [place];  Service: Endoscopy;  Laterality: N/A;  . GIVENS CAPSULE STUDY N/A 12/24/2017   Procedure: GIVENS CAPSULE STUDY;  Surgeon: Irving Copas., MD;  Location: [place];  Service: Gastroenterology;  Laterality: N/A;  . GIVENS CAPSULE STUDY N/A 05/22/2018   Procedure: GIVENS CAPSULE STUDY;  Surgeon: Irving Copas., MD;  Location: [place];  Service: Gastroenterology;  Laterality: N/A;  . INSERTION OF DIALYSIS CATHETER Right 04/07/2016   Procedure: INSERTION OF DIALYSIS CATHETER;  Surgeon: Waynetta Sandy, MD;  Location: [place];  Service: Vascular;  Laterality: Right;  . INSERTION OF DIALYSIS CATHETER Right 04/04/2017   Procedure: INSERTION OF DIALYSIS CATHETER;  Surgeon: Waynetta Sandy, MD;  Location: [place];  Service: Vascular;  Laterality: Right;  . LIGATION OF ARTERIOVENOUS  FISTULA Left 04/14/2016   Procedure: LIGATION OF ARTERIOVENOUS  FISTULA;  Surgeon: Angelia Mould, MD;  Location: [place];  Service: Vascular;  Laterality: Left;  . PATCH ANGIOPLASTY Left 06/19/2016   Procedure: PATCH ANGIOPLASTY LEFT ARTERIOVENOUS FISTULA;  Surgeon: Angelia Mould, MD;  Location: [place];  Service: Vascular;  Laterality: Left;  . POLYPECTOMY  09/04/2017   Procedure: POLYPECTOMY;  Surgeon: Ronnette Juniper, MD;  Location: [facility] ENDOSCOPY;  Service: Gastroenterology;;  . REVISON OF ARTERIOVENOUS FISTULA Left 06/19/2016   Procedure: REVISION SUPERFICIALIZATION OF BRACHIOCEPHALIC  ARTERIOVENOUS FISTULA;  Surgeon: Angelia Mould, MD;  Location: [facility] OR;  Service: Vascular;  Laterality: Left;   Family Hx:  Family History  Problem Relation Age of Onset  . Hypertension Mother   . Thyroid disease Mother   . Coronary artery disease Father   . Hypertension Father   . Diabetes Father   . Colon cancer Maternal Aunt   . Esophageal cancer Maternal Uncle   . Inflammatory bowel disease Neg Hx   . Liver disease Neg Hx   . Pancreatic cancer Neg Hx   . Rectal cancer Neg Hx   . Stomach cancer Neg Hx    Social Hx:   Social History   Tobacco Use  . Smoking status: Never Smoker  . Smokeless tobacco: Never Used  Substance Use Topics  . Alcohol use: No  . Drug use: No   Medication:    Current Outpatient Medications:  .  allopurinol (ZYLOPRIM) 100 MG tablet, Take 100 mg by mouth at bedtime. , Disp: , Rfl:  .  ALPRAZolam (XANAX) 1 MG tablet, Take 1 mg by mouth 2 (two) times daily as needed for anxiety. , Disp: , Rfl:  .  atorvastatin (LIPITOR) 40 MG tablet, Take  40 mg by mouth at bedtime. , Disp: , Rfl: 3 .  calcitRIOL (ROCALTROL) 0.5 MCG capsule, Take 1 capsule (0.5 mcg total) by mouth daily with breakfast. (Patient taking differently: Take 0.5 mcg by mouth at bedtime. ), Disp: 30 capsule, Rfl: 0 .  gentamicin ointment (GARAMYCIN) 0.1 %, Apply 1 application topically daily as needed (changing dressing.)., Disp: , Rfl:  .  HYDROcodone-homatropine (HYCODAN) 5-1.5 MG/5ML syrup, Take 5 mLs by mouth every 6 (six) hours as needed for cough., Disp: 120 mL, Rfl: 0 .  hydrOXYzine (ATARAX/VISTARIL) 25 MG tablet, Take 25 mg by mouth every 6 (six) hours as needed for itching. , Disp: , Rfl:  .  methocarbamol (ROBAXIN) 500 MG tablet, Take 1 tablet (500 mg total) by mouth every 8 (eight) hours as needed for muscle spasms., Disp: 20 tablet, Rfl: 0 .  metoprolol tartrate (LOPRESSOR) 25 MG tablet, Take 25 mg by mouth at bedtime. , Disp: , Rfl:  .  multivitamin (RENA-VIT) TABS tablet,  Take 1 tablet by mouth at bedtime. , Disp: , Rfl:  .  omeprazole (PRILOSEC) 40 MG capsule, Take 40 mg by mouth 2 (two) times daily., Disp: , Rfl:  .  prochlorperazine (COMPAZINE) 10 MG tablet, Take 1 tablet (10 mg total) by mouth every 8 (eight) hours as needed for nausea or vomiting., Disp: 50 tablet, Rfl: 2 .  promethazine (PHENERGAN) 25 MG tablet, Take 1 tablet (25 mg total) by mouth every 8 (eight) hours as needed for nausea or vomiting., Disp: 10 tablet, Rfl: 0 .  sucroferric oxyhydroxide (VELPHORO) 500 MG chewable tablet, Chew 1,000 mg by mouth 3 (three) times daily before meals. , Disp: , Rfl:  .  venlafaxine XR (EFFEXOR-XR) 75 MG 24 hr capsule, Take 75 mg by mouth at bedtime. , Disp: , Rfl:    Allergies:  Nsaids and Tolmetin  Review of Systems: Gen:  Denies  fever, sweats, chills HEENT: Denies blurred vision, double vision. bleeds, sore throat Cvc:  No dizziness, chest pain. Resp:   Denies cough or sputum production, shortness of breath Gi: Denies swallowing difficulty, stomach pain. Gu:  Denies bladder incontinence, burning urine Ext:   No Joint pain, stiffness. Skin: No skin rash,  hives  Endoc:  No polyuria, polydipsia. Psych: No depression, insomnia. Other:  All other systems were reviewed with the patient and were negative other that what is mentioned in the HPI.   Physical Examination:   VS: BP 128/78 (BP Location: Left Arm, Cuff Size: Normal)   Pulse 94   Temp 98.1 F (36.7 C) (Temporal)   Ht 5\' 7"  (1.702 m)   Wt 264 lb 12.8 oz (120.1 kg)   SpO2 96%   BMI 41.47 kg/m   General Appearance: No distress  Neuro:without focal findings,  speech normal,  HEENT: PERRLA, EOM intact.  Mallampati 4. Pulmonary: normal breath sounds, No wheezing.  CardiovascularNormal S1,S2.  No m/r/g.   Abdomen: Benign, Soft, non-tender. Renal:  No costovertebral tenderness  GU:  No performed at this time. Endoc: No evident thyromegaly, no signs of acromegaly. Skin:   warm, no rashes, no  ecchymosis  Extremities: normal, no cyanosis, clubbing.  Other findings:    LABORATORY PANEL:   CBC No results for input(s): WBC, HGB, HCT, PLT in the last 168 hours. ------------------------------------------------------------------------------------------------------------------  Chemistries  No results for input(s): NA, K, CL, CO2, GLUCOSE, BUN, CREATININE, CALCIUM, MG, AST, ALT, ALKPHOS, BILITOT in the last 168 hours.  Invalid input(s): GFRCGP ------------------------------------------------------------------------------------------------------------------  Cardiac Enzymes No results for  input(s): TROPONINI in the last 168 hours. ------------------------------------------------------------  RADIOLOGY:  No results found.     Thank  you for the consultation and for allowing [facility] to assist in the care of your patient. Our recommendations are noted above.  Please contact us if we can be of further service.   Marda Stalker, M.D., F.C.C.P.  Board Certified in Internal Medicine, Pulmonary Medicine, [place], and Sleep Medicine.  [facility] Office Number: [phone]   07/18/2018

## 2018-08-17 DIAGNOSIS — H8112 Benign paroxysmal vertigo, left ear: Secondary | ICD-10-CM | POA: Insufficient documentation

## 2018-08-23 ENCOUNTER — Inpatient Hospital Stay (HOSPITAL_COMMUNITY)
Admission: EM | Admit: 2018-08-23 | Discharge: 2018-08-26 | DRG: 682 | Disposition: A | Discharge status: Home / Self Care | Payer: Medicare Other | Attending: Internal Medicine | Admitting: Internal Medicine

## 2018-08-23 ENCOUNTER — Inpatient Hospital Stay (HOSPITAL_COMMUNITY): Payer: Medicare Other

## 2018-08-23 DIAGNOSIS — R002 Palpitations: Secondary | ICD-10-CM | POA: Diagnosis present

## 2018-08-23 DIAGNOSIS — F329 Major depressive disorder, single episode, unspecified: Secondary | ICD-10-CM | POA: Diagnosis present

## 2018-08-23 DIAGNOSIS — Z79891 Long term (current) use of opiate analgesic: Secondary | ICD-10-CM | POA: Diagnosis not present

## 2018-08-23 DIAGNOSIS — K449 Diaphragmatic hernia without obstruction or gangrene: Secondary | ICD-10-CM | POA: Diagnosis present

## 2018-08-23 DIAGNOSIS — Z9889 Other specified postprocedural states: Secondary | ICD-10-CM | POA: Diagnosis not present

## 2018-08-23 DIAGNOSIS — E7800 Pure hypercholesterolemia, unspecified: Secondary | ICD-10-CM | POA: Diagnosis present

## 2018-08-23 DIAGNOSIS — K922 Gastrointestinal hemorrhage, unspecified: Secondary | ICD-10-CM | POA: Diagnosis present

## 2018-08-23 DIAGNOSIS — D631 Anemia in chronic kidney disease: Secondary | ICD-10-CM | POA: Diagnosis present

## 2018-08-23 DIAGNOSIS — N186 End stage renal disease: Secondary | ICD-10-CM | POA: Diagnosis present

## 2018-08-23 DIAGNOSIS — Z888 Allergy status to other drugs, medicaments and biological substances status: Secondary | ICD-10-CM | POA: Diagnosis not present

## 2018-08-23 DIAGNOSIS — Z9221 Personal history of antineoplastic chemotherapy: Secondary | ICD-10-CM | POA: Diagnosis not present

## 2018-08-23 DIAGNOSIS — Z8673 Personal history of transient ischemic attack (TIA), and cerebral infarction without residual deficits: Secondary | ICD-10-CM

## 2018-08-23 DIAGNOSIS — Z6841 Body Mass Index (BMI) 40.0 and over, adult: Secondary | ICD-10-CM | POA: Diagnosis not present

## 2018-08-23 DIAGNOSIS — Z79899 Other long term (current) drug therapy: Secondary | ICD-10-CM | POA: Diagnosis not present

## 2018-08-23 DIAGNOSIS — N2581 Secondary hyperparathyroidism of renal origin: Secondary | ICD-10-CM | POA: Diagnosis present

## 2018-08-23 DIAGNOSIS — I120 Hypertensive chronic kidney disease with stage 5 chronic kidney disease or end stage renal disease: Principal | ICD-10-CM | POA: Diagnosis present

## 2018-08-23 DIAGNOSIS — F419 Anxiety disorder, unspecified: Secondary | ICD-10-CM | POA: Diagnosis present

## 2018-08-23 DIAGNOSIS — Z8249 Family history of ischemic heart disease and other diseases of the circulatory system: Secondary | ICD-10-CM | POA: Diagnosis not present

## 2018-08-23 DIAGNOSIS — R0600 Dyspnea, unspecified: Secondary | ICD-10-CM

## 2018-08-23 DIAGNOSIS — M109 Gout, unspecified: Secondary | ICD-10-CM | POA: Diagnosis present

## 2018-08-23 DIAGNOSIS — D62 Acute posthemorrhagic anemia: Secondary | ICD-10-CM | POA: Diagnosis present

## 2018-08-23 DIAGNOSIS — K219 Gastro-esophageal reflux disease without esophagitis: Secondary | ICD-10-CM | POA: Diagnosis present

## 2018-08-23 DIAGNOSIS — Z8719 Personal history of other diseases of the digestive system: Secondary | ICD-10-CM | POA: Diagnosis not present

## 2018-08-23 DIAGNOSIS — D649 Anemia, unspecified: Secondary | ICD-10-CM | POA: Diagnosis not present

## 2018-08-23 DIAGNOSIS — K5730 Diverticulosis of large intestine without perforation or abscess without bleeding: Secondary | ICD-10-CM | POA: Diagnosis not present

## 2018-08-23 DIAGNOSIS — Z886 Allergy status to analgesic agent status: Secondary | ICD-10-CM | POA: Diagnosis not present

## 2018-08-23 DIAGNOSIS — F339 Major depressive disorder, recurrent, unspecified: Secondary | ICD-10-CM | POA: Diagnosis not present

## 2018-08-23 DIAGNOSIS — Z992 Dependence on renal dialysis: Secondary | ICD-10-CM | POA: Diagnosis not present

## 2018-08-23 DIAGNOSIS — E876 Hypokalemia: Secondary | ICD-10-CM | POA: Diagnosis not present

## 2018-08-23 DIAGNOSIS — N250 Renal osteodystrophy: Secondary | ICD-10-CM | POA: Diagnosis present

## 2018-08-23 DIAGNOSIS — Z20828 Contact with and (suspected) exposure to other viral communicable diseases: Secondary | ICD-10-CM | POA: Diagnosis present

## 2018-08-23 LAB — IRON AND TIBC
Iron: 91 ug/dL (ref 28–170)
Saturation Ratios: 35 % — ABNORMAL HIGH (ref 10.4–31.8)
TIBC: 263 ug/dL (ref 250–450)
UIBC: 172 ug/dL

## 2018-08-23 LAB — COMPREHENSIVE METABOLIC PANEL
ALT: 20 U/L (ref 0–44)
AST: 30 U/L (ref 15–41)
Albumin: 3.7 g/dL (ref 3.5–5.0)
Alkaline Phosphatase: 55 U/L (ref 38–126)
Anion gap: 23 — ABNORMAL HIGH (ref 5–15)
BUN: 54 mg/dL — ABNORMAL HIGH (ref 6–20)
CO2: 19 mmol/L — ABNORMAL LOW (ref 22–32)
Calcium: 8 mg/dL — ABNORMAL LOW (ref 8.9–10.3)
Chloride: 98 mmol/L (ref 98–111)
Creatinine, Ser: 16.74 mg/dL — ABNORMAL HIGH (ref 0.44–1.00)
GFR calc Af Amer: 3 mL/min — ABNORMAL LOW (ref >60)
GFR calc non Af Amer: 2 mL/min — ABNORMAL LOW (ref >60)
Glucose, Bld: 138 mg/dL — ABNORMAL HIGH (ref 70–99)
Potassium: 3.2 mmol/L — ABNORMAL LOW (ref 3.5–5.1)
Sodium: 140 mmol/L (ref 135–145)
Total Bilirubin: 0.9 mg/dL (ref 0.3–1.2)
Total Protein: 7.6 g/dL (ref 6.5–8.1)

## 2018-08-23 LAB — CBC
HCT: 27.9 % — ABNORMAL LOW (ref 36.0–46.0)
HCT: 25.6 % — ABNORMAL LOW (ref 36.0–46.0)
Hemoglobin: 8.4 g/dL — ABNORMAL LOW (ref 12.0–15.0)
Hemoglobin: 7.9 g/dL — ABNORMAL LOW (ref 12.0–15.0)
MCH: 28.9 pg (ref 26.0–34.0)
MCH: 28.9 pg (ref 26.0–34.0)
MCHC: 30.1 g/dL (ref 30.0–36.0)
MCHC: 30.9 g/dL (ref 30.0–36.0)
MCV: 95.9 fL (ref 80.0–100.0)
MCV: 93.8 fL (ref 80.0–100.0)
Platelets: 367 10*3/uL (ref 150–400)
Platelets: 364 10*3/uL (ref 150–400)
RBC: 2.91 MIL/uL — ABNORMAL LOW (ref 3.87–5.11)
RBC: 2.73 MIL/uL — ABNORMAL LOW (ref 3.87–5.11)
RDW: 20.2 % — ABNORMAL HIGH (ref 11.5–15.5)
RDW: 19.9 % — ABNORMAL HIGH (ref 11.5–15.5)
WBC: 9.2 10*3/uL (ref 4.0–10.5)
WBC: 9.3 10*3/uL (ref 4.0–10.5)
nRBC: 2.1 % — ABNORMAL HIGH (ref 0.0–0.2)
nRBC: 1.4 % — ABNORMAL HIGH (ref 0.0–0.2)

## 2018-08-23 LAB — PREPARE RBC (CROSSMATCH)

## 2018-08-23 LAB — TSH: TSH: 0.996 u[IU]/mL (ref 0.350–4.500)

## 2018-08-23 LAB — BRAIN NATRIURETIC PEPTIDE: B Natriuretic Peptide: 43.7 pg/mL (ref 0.0–100.0)

## 2018-08-23 LAB — TROPONIN I (HIGH SENSITIVITY): Troponin I (High Sensitivity): 10 ng/L (ref <18)

## 2018-08-23 LAB — FERRITIN: Ferritin: 1320 ng/mL — ABNORMAL HIGH (ref 11–307)

## 2018-08-23 MED ORDER — PANTOPRAZOLE SODIUM 40 MG PO TBEC
40.0000 mg | DELAYED_RELEASE_TABLET | Freq: Two times a day (BID) | ORAL | Status: DC
  Administered 2018-08-23 – 2018-08-26 (×6): 40 mg via ORAL
  Filled 2018-08-23 (×6): qty 1

## 2018-08-23 MED ORDER — SODIUM CHLORIDE 0.9 % IV SOLN: 10.0000 mL/h | Freq: Once | INTRAVENOUS | Status: DC

## 2018-08-23 MED ORDER — GENTAMICIN SULFATE 0.1 % EX CREA
1.0000 "application " | TOPICAL_CREAM | Freq: Every day | CUTANEOUS | Status: DC
  Administered 2018-08-24 – 2018-08-25 (×3): 1 via TOPICAL
  Filled 2018-08-23: qty 15

## 2018-08-23 MED ORDER — DELFLEX-LC/2.5% DEXTROSE 394 MOSM/L IP SOLN
Freq: Every day | INTRAPERITONEAL | Status: DC
  Administered 2018-08-24: 5000 mL via INTRAPERITONEAL

## 2018-08-23 MED ORDER — HEPARIN 1000 UNIT/ML FOR PERITONEAL DIALYSIS
INTRAPERITONEAL | Status: DC | PRN
  Filled 2018-08-23: qty 5000

## 2018-08-23 MED ORDER — ALPRAZOLAM 0.5 MG PO TABS
1.0000 mg | ORAL_TABLET | Freq: Two times a day (BID) | ORAL | Status: DC | PRN
  Administered 2018-08-23 – 2018-08-25 (×3): 1 mg via ORAL
  Filled 2018-08-23 (×3): qty 2

## 2018-08-23 MED ORDER — PROMETHAZINE HCL 25 MG PO TABS
25.0000 mg | ORAL_TABLET | Freq: Three times a day (TID) | ORAL | Status: DC | PRN
  Administered 2018-08-24 – 2018-08-25 (×2): 25 mg via ORAL
  Filled 2018-08-23 (×2): qty 1

## 2018-08-23 MED ORDER — RENA-VITE PO TABS
1.0000 | ORAL_TABLET | Freq: Every day | ORAL | Status: DC
  Administered 2018-08-23 – 2018-08-25 (×3): 1 via ORAL
  Filled 2018-08-23 (×3): qty 1

## 2018-08-23 MED ORDER — VENLAFAXINE HCL ER 75 MG PO CP24
75.0000 mg | ORAL_CAPSULE | Freq: Every day | ORAL | Status: DC
  Administered 2018-08-23 – 2018-08-25 (×3): 75 mg via ORAL
  Filled 2018-08-23 (×5): qty 1

## 2018-08-23 MED ORDER — HEPARIN 1000 UNIT/ML FOR PERITONEAL DIALYSIS: 500.0000 [IU] | INTRAMUSCULAR | Status: DC | PRN

## 2018-08-23 MED ORDER — SUCROFERRIC OXYHYDROXIDE 500 MG PO CHEW
1000.0000 mg | CHEWABLE_TABLET | Freq: Three times a day (TID) | ORAL | Status: DC
  Administered 2018-08-24 – 2018-08-26 (×6): 1000 mg via ORAL
  Filled 2018-08-23 (×7): qty 2

## 2018-08-23 MED ORDER — PRAVASTATIN SODIUM 10 MG PO TABS
20.0000 mg | ORAL_TABLET | Freq: Every day | ORAL | Status: DC
  Administered 2018-08-23 – 2018-08-25 (×3): 20 mg via ORAL
  Filled 2018-08-23 (×3): qty 2

## 2018-08-23 MED ORDER — METOPROLOL TARTRATE 25 MG PO TABS
25.0000 mg | ORAL_TABLET | Freq: Every day | ORAL | Status: DC
  Administered 2018-08-23 – 2018-08-25 (×3): 25 mg via ORAL
  Filled 2018-08-23 (×3): qty 1

## 2018-08-23 NOTE — ED Provider Notes (Signed)
[facility] EMERGENCY DEPARTMENT Provider Note   CSN: [id number] Arrival date & time: 08/23/18  1226    History   Chief Complaint Chief Complaint  Patient presents with   Abnormal Lab    HPI Patricia [name] is a 43 y.o. female.     The history is provided by the patient.  Weakness Severity:  Mild Onset quality:  Gradual Timing:  Constant Progression:  Unchanged Chronicity:  New Context comment:  Came for lab check as Hb was 6.7 on Monday, feels symptomatic with weakness, dizziness, SOB over last few days, has needed transfusion in the past. ESRD, got EPO shot last week. Has had dark stools.  Relieved by:  Nothing Worsened by:  Nothing Associated symptoms: dizziness   Associated symptoms: no abdominal pain, no aphasia, no arthralgias, no ataxia, no chest pain, no cough, no dysuria, no fever, no seizures, no shortness of breath and no vomiting   Risk factors comment:  ESRD, ulcer issues in past   Past Medical History:  Diagnosis Date   Acid reflux    Anxiety    Arthritis    Dyspnea    Dysrhythmia    tachycardia   ESRD (end stage renal disease) ([place])    [facility] [place]   GI bleeding 12/2017   Gout    Headache(784.0)    "related to chemo; sometimes weekly" (09/12/2013)   High cholesterol    History of blood transfusion "a couple"   "related to low counts"   Hypertension    Iron deficiency anemia    "get epogen shots q month" (02/20/2014)   MCGN (minimal change glomerulonephritis)    "using chemo to tx;  finished my last tx in 12/2013"   Peritoneal dialysis status ([place])    Stroke ([place]) 08/15/2017    Patient Active Problem List   Diagnosis Date Noted   Acute blood loss anemia 05/21/2018   GI bleed 05/20/2018   ESRD on peritoneal dialysis ([place]) 04/01/2016   Anemia due to chronic kidney disease 04/01/2016   GERD (gastroesophageal reflux disease) 04/01/2016   Nephrotic syndrome 10/02/2013   Dyslipidemia 10/02/2013    PAT (paroxysmal atrial tachycardia) ([place]) 09/29/2013   Normocytic anemia 06/29/2013   Glomerulonephritis 01/28/2013   Morbid obesity ([place]) 07/04/2011   HTN (hypertension) 07/04/2011   Hypercholesterolemia 07/04/2011    Past Surgical History:  Procedure Laterality Date   ABDOMINAL HYSTERECTOMY  2010   "laparoscopic"   ANKLE FRACTURE SURGERY Right 1994   AV FISTULA PLACEMENT Left 04/14/2016   Procedure: ARTERIOVENOUS (AV) FISTULA CREATION;  Surgeon: Angelia Mould, MD;  Location: [facility] OR;  Service: Vascular;  Laterality: Left;   AV FISTULA PLACEMENT Left 08/09/2017   Procedure: INSERTION OF ARTERIOVENOUS (AV) GORE-TEX GRAFT LEFT ARM;  Surgeon: Elam Dutch, MD;  Location: [place];  Service: Vascular;  Laterality: Left;   [place] REMOVAL Left 08/11/2017   Procedure: REMOVAL OF ARTERIOVENOUS GORETEX GRAFT ([place]);  Surgeon: Serafina Mitchell, MD;  Location: [place];  Service: Vascular;  Laterality: Left;   BIOPSY  09/03/2017   Procedure: BIOPSY;  Surgeon: Otis Brace, MD;  Location: [place];  Service: Gastroenterology;;   BIOPSY  12/24/2017   Procedure: BIOPSY;  Surgeon: Irving Copas., MD;  Location: [place];  Service: Gastroenterology;;   CARDIAC CATHETERIZATION  2000's   COLONOSCOPY WITH PROPOFOL N/A 09/04/2017   Procedure: COLONOSCOPY WITH PROPOFOL;  Surgeon: Ronnette Juniper, MD;  Location: [place];  Service: Gastroenterology;  Laterality: N/A;   ENTEROSCOPY N/A 12/24/2017  Procedure: ENTEROSCOPY;  Surgeon: Rush Landmark Telford Nab., MD;  Location: [place];  Service: Gastroenterology;  Laterality: N/A;   ESOPHAGOGASTRODUODENOSCOPY     ESOPHAGOGASTRODUODENOSCOPY (EGD) WITH PROPOFOL Left 06/04/2017   Procedure: ESOPHAGOGASTRODUODENOSCOPY (EGD) WITH PROPOFOL;  Surgeon: Arta Silence, MD;  Location: [place];  Service: Endoscopy;  Laterality: Left;   ESOPHAGOGASTRODUODENOSCOPY (EGD) WITH PROPOFOL N/A 09/03/2017   Procedure:  ESOPHAGOGASTRODUODENOSCOPY (EGD) WITH PROPOFOL;  Surgeon: Otis Brace, MD;  Location: [place];  Service: Gastroenterology;  Laterality: N/A;   FISTULA SUPERFICIALIZATION Left 04/02/2017   Procedure: FISTULA SUPERFICIALIZATION LEFT ARM;  Surgeon: Serafina Mitchell, MD;  Location: [place];  Service: Vascular;  Laterality: Left;   FISTULOGRAM Left 04/07/2016   Procedure: FISTULOGRAM;  Surgeon: Waynetta Sandy, MD;  Location: [place];  Service: Vascular;  Laterality: Left;   FRACTURE SURGERY     right ankle   GIVENS CAPSULE STUDY N/A 10/29/2017   Procedure: GIVENS CAPSULE STUDY;  Surgeon: Wilford Corner, MD;  Location: [place];  Service: Endoscopy;  Laterality: N/A;   GIVENS CAPSULE STUDY N/A 12/24/2017   Procedure: GIVENS CAPSULE STUDY;  Surgeon: Irving Copas., MD;  Location: [place];  Service: Gastroenterology;  Laterality: N/A;   GIVENS CAPSULE STUDY N/A 05/22/2018   Procedure: GIVENS CAPSULE STUDY;  Surgeon: Irving Copas., MD;  Location: [place];  Service: Gastroenterology;  Laterality: N/A;   INSERTION OF DIALYSIS CATHETER Right 04/07/2016   Procedure: INSERTION OF DIALYSIS CATHETER;  Surgeon: Waynetta Sandy, MD;  Location: [place];  Service: Vascular;  Laterality: Right;   INSERTION OF DIALYSIS CATHETER Right 04/04/2017   Procedure: INSERTION OF DIALYSIS CATHETER;  Surgeon: Waynetta Sandy, MD;  Location: [place];  Service: Vascular;  Laterality: Right;   LIGATION OF ARTERIOVENOUS  FISTULA Left 04/14/2016   Procedure: LIGATION OF ARTERIOVENOUS  FISTULA;  Surgeon: Angelia Mould, MD;  Location: [place];  Service: Vascular;  Laterality: Left;   PATCH ANGIOPLASTY Left 06/19/2016   Procedure: PATCH ANGIOPLASTY LEFT ARTERIOVENOUS FISTULA;  Surgeon: Angelia Mould, MD;  Location: [place];  Service: Vascular;  Laterality: Left;   POLYPECTOMY  09/04/2017   Procedure: POLYPECTOMY;  Surgeon: Ronnette Juniper, MD;  Location[facility]  ENDOSCOPY;  Service: Gastroenterology;;   REVISON OF ARTERIOVENOUS FISTULA Left 06/19/2016   Procedure: REVISION SUPERFICIALIZATION OF BRACHIOCEPHALIC ARTERIOVENOUS FISTULA;  Surgeon: Angelia Mould, MD;  Location: [facility] OR;  Service: Vascular;  Laterality: Left;     OB History    Gravida  3   Para      Term      Preterm      AB      Living        SAB      TAB      Ectopic      Multiple  3   Live Births               Home Medications    Prior to Admission medications   Medication Sig Start Date End Date Taking? Authorizing Provider  allopurinol (ZYLOPRIM) 100 MG tablet Take 100 mg by mouth at bedtime.     [provider]  ALPRAZolam Duanne Moron) 1 MG tablet Take 1 mg by mouth 2 (two) times daily as needed for anxiety.  03/19/17   [provider]  atorvastatin (LIPITOR) 40 MG tablet Take 40 mg by mouth at bedtime.  03/14/14   [provider]  calcitRIOL (ROCALTROL) 0.5 MCG capsule Take 1 capsule (0.5 mcg total) by mouth daily  with breakfast. Patient taking differently: Take 0.5 mcg by mouth at bedtime.  06/04/17   Bonnielee Haff, MD  gentamicin ointment (GARAMYCIN) 0.1 % Apply 1 application topically daily as needed (changing dressing.).    [provider]  HYDROcodone-homatropine (HYCODAN) 5-1.5 MG/5ML syrup Take 5 mLs by mouth every 6 (six) hours as needed for cough. 07/07/18   Mesner, Corene Cornea, MD  hydrOXYzine (ATARAX/VISTARIL) 25 MG tablet Take 25 mg by mouth every 6 (six) hours as needed for itching.  01/25/18   [provider]  methocarbamol (ROBAXIN) 500 MG tablet Take 1 tablet (500 mg total) by mouth every 8 (eight) hours as needed for muscle spasms. 07/07/18   Mesner, Corene Cornea, MD  metoprolol tartrate (LOPRESSOR) 25 MG tablet Take 25 mg by mouth at bedtime.  04/17/16   [provider]  multivitamin (RENA-VIT) TABS tablet Take 1 tablet by mouth at bedtime.     [provider]  omeprazole (PRILOSEC) 40 MG capsule  Take 40 mg by mouth 2 (two) times daily.    [provider]  prochlorperazine (COMPAZINE) 10 MG tablet Take 1 tablet (10 mg total) by mouth every 8 (eight) hours as needed for nausea or vomiting. 02/17/18   Mansouraty, Telford Nab., MD  promethazine (PHENERGAN) 25 MG tablet Take 1 tablet (25 mg total) by mouth every 8 (eight) hours as needed for nausea or vomiting. 04/07/18   Shirley, [place], DO  sucroferric oxyhydroxide (VELPHORO) 500 MG chewable tablet Chew 1,000 mg by mouth 3 (three) times daily before meals.     [provider]  venlafaxine XR (EFFEXOR-XR) 75 MG 24 hr capsule Take 75 mg by mouth at bedtime.     [provider]    Family History Family History  Problem Relation Age of Onset   Hypertension Mother    Thyroid disease Mother    Coronary artery disease Father    Hypertension Father    Diabetes Father    Colon cancer Maternal Aunt    Esophageal cancer Maternal Uncle    Inflammatory bowel disease Neg Hx    Liver disease Neg Hx    Pancreatic cancer Neg Hx    Rectal cancer Neg Hx    Stomach cancer Neg Hx     Social History Social History   Tobacco Use   Smoking status: Never Smoker   Smokeless tobacco: Never Used  Substance Use Topics   Alcohol use: No   Drug use: No     Allergies   Nsaids and Tolmetin   Review of Systems Review of Systems  Constitutional: Negative for chills and fever.  HENT: Negative for ear pain and sore throat.   Eyes: Negative for pain and visual disturbance.  Respiratory: Negative for cough and shortness of breath.   Cardiovascular: Negative for chest pain and palpitations.  Gastrointestinal: Positive for blood in stool. Negative for abdominal pain and vomiting.  Genitourinary: Negative for dysuria and hematuria.  Musculoskeletal: Negative for arthralgias and back pain.  Skin: Negative for color change and rash.  Neurological: Positive for dizziness and weakness. Negative for seizures and  syncope.  All other systems reviewed and are negative.    Physical Exam Updated Vital Signs  ED Triage Vitals [08/23/18 1235]  Enc Vitals Group     BP (!) 144/86     Pulse Rate 74     Resp 16     Temp 98.6 F (37 C)     Temp Source Oral     SpO2 100 %  Weight      Height      Head Circumference      Peak Flow      Pain Score 0     Pain Loc      Pain Edu?      Excl. in GC?     Physical Exam Vitals signs and nursing note reviewed.  Constitutional:      General: She is not in acute distress.    Appearance: She is well-developed. She is not ill-appearing.  HENT:     Head: Normocephalic and atraumatic.     Mouth/Throat:     Mouth: Mucous membranes are moist.  Eyes:     Extraocular Movements: Extraocular movements intact.     Conjunctiva/sclera: Conjunctivae normal.     Pupils: Pupils are equal, round, and reactive to light.  Neck:     Musculoskeletal: Neck supple.  Cardiovascular:     Rate and Rhythm: Normal rate and regular rhythm.     Pulses: Normal pulses.     Heart sounds: Normal heart sounds. No murmur.  Pulmonary:     Effort: Pulmonary effort is normal. No respiratory distress.     Breath sounds: Normal breath sounds.  Abdominal:     Palpations: Abdomen is soft.     Tenderness: There is no abdominal tenderness.  Genitourinary:    Comments: Unable to get good sample, appeared to be a spec of black stool  Skin:    General: Skin is warm and dry.     Capillary Refill: Capillary refill takes less than 2 seconds.  Neurological:     General: No focal deficit present.     Mental Status: She is alert.      ED Treatments / Results  Labs (all labs ordered are listed, but only abnormal results are displayed) Labs Reviewed  COMPREHENSIVE METABOLIC PANEL - Abnormal; Notable for the following components:      Result Value   Potassium 3.2 (*)    CO2 19 (*)    Glucose, Bld 138 (*)    BUN 54 (*)    Creatinine, Ser 16.74 (*)    Calcium 8.0 (*)    GFR calc  non Af Amer 2 (*)    GFR calc Af Amer 3 (*)    Anion gap 23 (*)    All other components within normal limits  CBC - Abnormal; Notable for the following components:   RBC 2.91 (*)    Hemoglobin 8.4 (*)    HCT 27.9 (*)    RDW 20.2 (*)    nRBC 2.1 (*)    All other components within normal limits  CBC - Abnormal; Notable for the following components:   RBC 2.73 (*)    Hemoglobin 7.9 (*)    HCT 25.6 (*)    RDW 19.9 (*)    nRBC 1.4 (*)    All other components within normal limits  SARS CORONAVIRUS 2  POC OCCULT BLOOD, ED  TYPE AND SCREEN  PREPARE RBC (CROSSMATCH)    EKG EKG Interpretation  Date/Time:  Tuesday August 23 2018 12:34:12 EDT Ventricular Rate:  79 PR Interval:  148 QRS Duration: 94 QT Interval:  406 QTC Calculation: 465 R Axis:   58 Text Interpretation:  Normal sinus rhythm Normal ECG Confirmed by Virgina Norfolk [phone]) on 08/23/2018 3:57:10 PM   Radiology No results found.  Procedures .Critical Care Performed by: Virgina Norfolk, DO Authorized by: Virgina Norfolk, DO   Critical care provider statement:    Critical  care time (minutes):  35   Critical care was necessary to treat or prevent imminent or life-threatening deterioration of the following conditions:  Circulatory failure   Critical care was time spent personally by me on the following activities:  Blood draw for specimens, development of treatment plan with patient or surrogate, discussions with primary provider, evaluation of patient's response to treatment, examination of patient, obtaining history from patient or surrogate, ordering and performing treatments and interventions, ordering and review of laboratory studies, pulse oximetry, ordering and review of radiographic studies, re-evaluation of patient's condition and review of old charts   (including critical care time)  Medications Ordered in ED Medications  0.9 %  sodium chloride infusion (has no administration in time range)     Initial  Impression / Assessment and Plan / ED Course  I have reviewed the triage vital signs and the nursing notes.  Pertinent labs & imaging results that were available during my care of the patient were reviewed by me and considered in my medical decision making (see chart for details).     Patricia [name] is a 43 year old female history of end-stage renal disease, prior GI bleed, hypertension, high cholesterol who presents the ED with weakness, low hemoglobin.  Patient has had dark stools for the last several days.  Patient with normal vitals.  No fever.  States that she had lab work yesterday that showed a hemoglobin of 6.7.  Today's 8.4.  Patient states that she had erythropoietin shot last week.  Has had black stools over the last several days.  Intermittently has this issue.  Has had a GI bleed in the past.  She feels symptomatic with shortness of breath and dizziness with ambulation.  She denies any chest pain.  Patient does home peritoneal dialysis.  No fever.  Lab work shows stable creatinine.  Patient has been in the waiting room for several hours and will repeat hemoglobin as I was unable to get a good stool sample.  Rectal vault was empty.  Repeat hemoglobin is 7.9.  Possible GI bleed versus symptomatic anemia.  She has required blood transfusions in the past but states that she usually gets them when her hemoglobin is less than 7.  Will transfuse a unit of blood for symptomatic anemia and possible GI bleed.  Patient to be admitted to internal medicine for further care.  Hemodynamically stable throughout my care.  This chart was dictated using voice recognition software.  Despite best efforts to proofread,  errors can occur which can change the documentation meaning.    Final Clinical Impressions(s) / ED Diagnoses   Final diagnoses:  Symptomatic anemia    ED Discharge Orders    None       Lennice Sites, DO 08/23/18 1755

## 2018-08-23 NOTE — H&P (Addendum)
Date: 08/23/2018               Patient Name:  Patricia [name] MRN: [id number]  DOB: 1975-05-27 Age / Sex: 43 y.o., female   PCP: Elayne Guerin         Medical Service: Internal Medicine Teaching Service         Attending Physician: Dr. Rebeca Alert, Raynaldo Opitz, MD    First Contact: Marva Panda, MD, [place] Pager: [place] [phone])  Second Contact: Shan Levans, MD, Erlene Quan Pager: [place] [phone])       After Hours (After 5p/  First Contact Pager: [phone]  weekends / holidays): Second Contact Pager: [phone]   Chief Complaint: shortness of breath, chest pain and generalized weakness  History of Present Illness: 44 y.o. yo female w/ PMH significant for ESRD (on peritoneal dialysis), Hypertension, prior GI bleed, iron deficiency anemia presenting with 3-4 days of dizziness, shortness of breath and chest pain on exertion. She reports right sided chest pain on exertion that does not radiate and is relieved with rest. She reports tachycardia up to 130 on exertion that is improved with rest. She also reports associated fatigue, generalized weakness for this duration. She reports a decreased appetite, nausea and one episode of nonbloody, nonbilious emesis this morning with clear liquid. Patient also reports two BM's (yesterday and today) that were dark in color but does not report seeing frank blood in bowels. She denied any abdominal pain, urinary symptoms, subjective fevers. However, she does report chills and night sweats since last night. Patient reports that last peritoneal dialysis session was previous night and reports that she adjusted the balance for increasing edema over the weekend.  She reports that her current symptoms are similar to previous episodes of anemia when her hemoglobin was less than 8, requiring transfusions in the past. Las transfusion was 08/2018 for Hb 7.9. Per patient, lab work yesterday at outside clinic revealed Hb 6.7.   In the ED, labs revealed Hb 8.4 and 7.9 on repeat. Given  patient's history of GI bleed in past and dark stools for past few days, patient given 1u pRBC for symptomatic anemia vs. possible GI bleed. Patient admitted to IMTS for further management.    Lab Orders     SARS CORONAVIRUS 2 Nasal Swab Aptima Multi Swab     Culture, blood (routine x 2)     Comprehensive metabolic panel     CBC     CBC     Ferritin     Iron and TIBC     Brain natriuretic peptide     POC occult blood, ED   Meds:  Current Meds  Medication Sig  . allopurinol (ZYLOPRIM) 100 MG tablet Take 100 mg by mouth at bedtime.   . ALPRAZolam (XANAX) 1 MG tablet Take 1 mg by mouth 2 (two) times daily as needed for anxiety.   . cephALEXin (KEFLEX) 500 MG capsule Take 500 mg by mouth 2 (two) times daily.  Marland Kitchen gentamicin ointment (GARAMYCIN) 0.1 % Apply 1 application topically daily as needed (changing dressing.).  Marland Kitchen hydrOXYzine (ATARAX/VISTARIL) 25 MG tablet Take 25 mg by mouth every 6 (six) hours as needed for itching.   Marland Kitchen lisinopril (ZESTRIL) 20 MG tablet Take 20 mg by mouth at bedtime.  . meclizine (ANTIVERT) 12.5 MG tablet Take 12.5 mg by mouth 2 (two) times daily as needed for dizziness.  . metoprolol tartrate (LOPRESSOR) 25 MG tablet Take 25 mg by mouth at bedtime.   . multivitamin (RENA-VIT) TABS  tablet Take 1 tablet by mouth at bedtime.   Marland Kitchen omeprazole (PRILOSEC) 40 MG capsule Take 40 mg by mouth 2 (two) times daily.  . pravastatin (PRAVACHOL) 20 MG tablet Take 20 mg by mouth at bedtime.  . prochlorperazine (COMPAZINE) 10 MG tablet Take 1 tablet (10 mg total) by mouth every 8 (eight) hours as needed for nausea or vomiting.  . promethazine (PHENERGAN) 25 MG tablet Take 1 tablet (25 mg total) by mouth every 8 (eight) hours as needed for nausea or vomiting.  . sucroferric oxyhydroxide (VELPHORO) 500 MG chewable tablet Chew 1,000 mg by mouth 3 (three) times daily before meals.   . venlafaxine XR (EFFEXOR-XR) 75 MG 24 hr capsule Take 75 mg by mouth at bedtime.      Allergies:  Allergies as of 08/23/2018 - Review Complete 08/23/2018  Allergen Reaction Noted  . Nsaids Other (See Comments) 01/27/2013  . Tolmetin Other (See Comments) 07/20/2015   Past Medical History:  Diagnosis Date  . Acid reflux   . Anxiety   . Arthritis   . Dyspnea   . Dysrhythmia    tachycardia  . ESRD (end stage renal disease) ([place])    [place]THSAT- [place]  . GI bleeding 12/2017  . Gout   . Headache(784.0)    "related to chemo; sometimes weekly" (09/12/2013)  . High cholesterol   . History of blood transfusion "a couple"   "related to low counts"  . Hypertension   . Iron deficiency anemia    "get epogen shots q month" (02/20/2014)  . 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 Peritoneal dialysis status ([place])   . Stroke [facility]) 08/15/2017    Family History:  Family History  Problem Relation Age of Onset  . Hypertension Mother   . Thyroid disease Mother   . Coronary artery disease Father   . Hypertension Father   . Diabetes Father   . Colon cancer Maternal Aunt   . Esophageal cancer Maternal Uncle   . Inflammatory bowel disease Neg Hx   . Liver disease Neg Hx   . Pancreatic cancer Neg Hx   . Rectal cancer Neg Hx   . Stomach cancer Neg Hx      Social History:  Social History   Tobacco Use  . Smoking status: Never Smoker  . Smokeless tobacco: Never Used  Substance Use Topics  . Alcohol use: No  . Drug use: No     Review of Systems: Review of Systems  Constitutional: Positive for chills, diaphoresis and malaise/fatigue. Negative for fever and weight loss.  HENT: Negative for congestion, sinus pain and sore throat.   Eyes: Negative for blurred vision and double vision.  Respiratory: Positive for shortness of breath. Negative for cough, hemoptysis and wheezing.   Cardiovascular: Positive for chest pain and palpitations. Negative for orthopnea and claudication.  Gastrointestinal: Positive for melena, nausea and  vomiting. Negative for abdominal pain, blood in stool, constipation, diarrhea and heartburn.  Musculoskeletal: Negative for back pain, falls, joint pain and myalgias.  Skin: Negative for itching and rash.  Neurological: Positive for dizziness and weakness. Negative for sensory change, focal weakness and headaches.  Endo/Heme/Allergies: Does not bruise/bleed easily.     Physical Exam: Blood pressure 136/78, pulse 72, temperature 98 F (36.7 C), temperature source Oral, resp. rate 16, SpO2 100 %.  Physical Exam  Constitutional: She is oriented to person, place, and time and well-developed, well-nourished, and in no  distress.  HENT:  Head: Normocephalic and atraumatic.  Mouth/Throat: Mucous membranes are dry.  Eyes: Conjunctivae are normal.  Cardiovascular: Normal rate, regular rhythm, normal heart sounds and intact distal pulses. Exam reveals no gallop and no friction rub.  No murmur heard. Pulmonary/Chest: Effort normal and breath sounds normal. No respiratory distress. She has no wheezes. She has no rales.  Abdominal: Soft. Bowel sounds are normal. She exhibits no distension. There is no abdominal tenderness. There is no guarding.  Peritoneal dialysis access on LLQ   Musculoskeletal: Normal range of motion.        General: No tenderness, deformity or edema.  Neurological: She is alert and oriented to person, place, and time. No cranial nerve deficit.  Skin: Skin is warm and dry. No rash noted. No erythema. No pallor.  Psychiatric: Mood, memory, affect and judgment normal.    EKG: personally reviewed my interpretation is Normal sinus rhythm.   CXR: personally reviewed my interpretation is cardiomegaly but no active pulmonary disease noted.     Assessment & Plan by Problem: Active Problems:   GI bleed Patient with hx of GI bleeds, ESRD on peritoneal dialysis, and iron deficiency anemia presenting with generalized weakness, fatigue, dizziness, shortness of breath and chest pain on  exertion for 3-4 days. She has had two bowel movements with dark stools and one day of nausea and vomiting. She also reports chills and night sweats since last night. Patient denies subjective fevers   - Blood cx - UA, urine cx - CBC - Iron and TIBC - Renal function panel  - TSH - BNP  Hx of GI Bleeds:  Hx of small bowel AVMs s/p cauterization at [facility] in 06/2018. On prilosec $RemoveBef'40mg'REJtvlXRAb$  bid.  Patient reports two days of dark stools. She denies any frank hematochezia. Unable to get stool sample/FOBT in ED as rectal vault empty. During previous hospitalization, patient received 3u pRBCs due to symptomatic anemia.  Previous workup:  EGD 02/18/2018:No gross lesions in esophagus. 3 cm hiatal hernia. Gastroesophageal flap valve classified as Hill Grade IV.Previous ulcer has healed.- No gross lesions in the duodenal bulb, in the first portion of the duodenum, in the second portion of the duodenum, in the third portion of the duodenum and in the fourth portion of the duodenum. Prominent Ampulla. Colonoscopy 09/04/2017: patch area of moderately congested and erythematous mucosa in the sigmoid, transverse and ascending colon, mucosal friability in the ascending colon, minimal fresh blood in the cecum, no active bleeding on thorough lavage. Diverticulosis sigmoid and descending colon. Capsule endoscopy 05/22/2018: Impression:Small bowel AVMs likely in the mid/distalileum.No active bleeding. Beyond the reach of conventional push enteroscopy.  - CBC - FOBT  - Pantoprazole $RemoveBefore'40mg'HLDOfxsRUmurK$  bid   ESRD on peritoneal dialysis: Patient with Hx of ESRD on peritoneal dialysis nightly. Possible transplant candidate. Per patient, last dialysis session was last night. She reports that she adjusted the balance for increasing edema over the weekend. Dry weight is 258lbs per patient.   - Continue peritoneal dialysis per nephrology - Renal function panel  - Strict I/O and daily weights - Rena-vit qd  - Sucroferric oxyhydroxide  $RemoveBefor'1000mg'hvWgLADmWaPa$  tid   Hx of Iron deficiency anemia:  Per chart review, this is due to chronic blood loss. She is followed by nephrology and reports she has received EPO shot last week. Per patient, she had Hb 6.7 yesterday but in ED today was 8.4 and 7.9 on repeat. Patient reports that she has had symptomatic anemia in the past requiring blood transfusion.  -  CBC  - Iron and TIBC   Hypertension:  Pt on metoprolol $RemoveBefor'25mg'jBuevzwIvVNU$  and lisinopril $RemoveBefore'20mg'ASCdXUskLdvTZ$  qd.  -Continue home meds   Hypercholesterolemia:  Pt on pravastatin $RemoveBefore'20mg'ToOTvBSprUsMv$  qd. - Continue home meds  Depression/Anxiety:  Pt on xanax  $Remo'1mg'GArzZ$  bid for anxiety and venlafaxine $RemoveBeforeD'75mg'klhYadPGzyoKQn$  qd.  - Continue home meds    Dispo: Admit patient to Inpatient with expected length of stay greater than 2 midnights.  Signed: Harvie Heck, MD  Internal Medicine, PGY-1 08/23/2018, 6:36 PM  Pager: [phone]

## 2018-08-23 NOTE — ED Notes (Signed)
Attempted report 

## 2018-08-23 NOTE — ED Notes (Signed)
ED TO INPATIENT HANDOFF REPORT  ED Nurse Name and Phone #: Leatha Gilding [phone]  S Name/Age/Gender Patricia [name] 43 y.o. female Room/Bed: 018C/018C  Code Status   Code Status: Full Code  Home/SNF/Other Home Patient oriented to: self, place, time and situationself, place,time, situation Is this baseline? yes  Triage Complete: Triage complete  Chief Complaint sob,cp,fatigue  Triage Note Pt states she had a hemoglobin level of 6 on Monday and was unable to get a out patient appt for blood so had to come to ED for transfusion. Pt does home Peritoneal dialysis . Pt states she has been getting sob and cp with any exertion.     Allergies Allergies  Allergen Reactions  . Nsaids Other (See Comments)    Cannot take due to Kidney disease/Kidney function   . Tolmetin Other (See Comments)    Cannot take due to Kidney Disease    Level of Care/Admitting Diagnosis ED Disposition    ED Disposition Condition Comment   Admit  [facility]ospital Area[facility] [[id number]]  Level of Care: Telemetry Medical [104]  Covid Evaluation: Asymptomatic Screening Protocol (No Symptoms)  Diagnosis: GI bleed [861683]  Admitting Physician: Anne Shutter [[id number]]  Attending Physician: Anne Shutter [[id number]]  Estimated length of stay: past midnight tomorrow  Certification:: I certify this patient will need inpatient services for at least 2 midnights  PT Class (Do Not Modify): Inpatient [101]  PT Acc Code (Do Not Modify): Private [1]       B Medical/Surgery History Past Medical History:  Diagnosis Date  . Acid reflux   . Anxiety   . Arthritis   . Dyspnea   . Dysrhythmia    tachycardia  . ESRD (end stage renal disease) ([facility])    [place]  . GI bleeding 12/2017  . Gout   . Headache(784.0)    "related to chemo; sometimes weekly" (09/12/2013)  . High cholesterol   . History of blood transfusion "a couple"   "related to low counts"  . Hypertension   . Iron  deficiency anemia    "get epogen shots q month" (02/20/2014)  . 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 Peritoneal dialysis status (H[facility])   . Stroke [facility]) 08/15/2017   Past Surgical History:  Procedure Laterality Date  . ABDOMINAL HYSTERECTOMY  2010   "laparoscopic"  . ANKLE FRACTURE SURGERY Right 1994  . AV FISTULA PLACEMENT Left 04/14/2016   Procedure: ARTERIOVENOUS (AV) FISTULA CREATION;  Surgeon: Chuck Hint, MD;  Location: [facility] OR;  Service: Vascular;  Laterality: Left;  . AV FISTULA PLACEMENT Left 08/09/2017   Procedure: INSERTION OF ARTERIOVENOUS (AV) GORE-TEX GRAFT LEFT ARM;  Surgeon: Sherren Kerns, MD;  Location: [facility] OR;  Service: Vascular;  Laterality: Left;  . AVGG REMOVAL Left 08/11/2017   Procedure: REMOVAL OF ARTERIOVENOUS GORETEX GRAFT (AVGG);  Surgeon: Nada Libman, MD;  Location: [facility] OR;  Service: Vascular;  Laterality: Left;  . BIOPSY  09/03/2017   Procedure: BIOPSY;  Surgeon: Kathi Der, MD;  Location: [facility] ENDOSCOPY;  Service: Gastroenterology;;  . BIOPSY  12/24/2017   Procedure: BIOPSY;  Surgeon: Lemar Lofty., MD;  Location: [facility] ENDOSCOPY;  Service: Gastroenterology;;  . CARDIAC CATHETERIZATION  2000's  . COLONOSCOPY WITH PROPOFOL N/A 09/04/2017   Procedure: COLONOSCOPY WITH PROPOFOL;  Surgeon: Kerin Salen, MD;  Location: [facility] ENDOSCOPY;  Service: Gastroenterology;  Laterality: N/A;  . ENTEROSCOPY N/A 12/24/2017   Procedure:  ENTEROSCOPY;  Surgeon: Rush Landmark Telford Nab., MD;  Location: [place];  Service: Gastroenterology;  Laterality: N/A;  . ESOPHAGOGASTRODUODENOSCOPY    . ESOPHAGOGASTRODUODENOSCOPY (EGD) WITH PROPOFOL Left 06/04/2017   Procedure: ESOPHAGOGASTRODUODENOSCOPY (EGD) WITH PROPOFOL;  Surgeon: Arta Silence, MD;  Location: [place];  Service: Endoscopy;  Laterality: Left;  . ESOPHAGOGASTRODUODENOSCOPY (EGD) WITH PROPOFOL N/A 09/03/2017   Procedure:  ESOPHAGOGASTRODUODENOSCOPY (EGD) WITH PROPOFOL;  Surgeon: Otis Brace, MD;  Location: [place];  Service: Gastroenterology;  Laterality: N/A;  . FISTULA SUPERFICIALIZATION Left 04/02/2017   Procedure: FISTULA SUPERFICIALIZATION LEFT ARM;  Surgeon: Serafina Mitchell, MD;  Location: [place];  Service: Vascular;  Laterality: Left;  . FISTULOGRAM Left 04/07/2016   Procedure: FISTULOGRAM;  Surgeon: Waynetta Sandy, MD;  Location: [place];  Service: Vascular;  Laterality: Left;  . FRACTURE SURGERY     right ankle  . GIVENS CAPSULE STUDY N/A 10/29/2017   Procedure: GIVENS CAPSULE STUDY;  Surgeon: Wilford Corner, MD;  Location: [place];  Service: Endoscopy;  Laterality: N/A;  . GIVENS CAPSULE STUDY N/A 12/24/2017   Procedure: GIVENS CAPSULE STUDY;  Surgeon: Irving Copas., MD;  Location: [place];  Service: Gastroenterology;  Laterality: N/A;  . GIVENS CAPSULE STUDY N/A 05/22/2018   Procedure: GIVENS CAPSULE STUDY;  Surgeon: Irving Copas., MD;  Location: [place];  Service: Gastroenterology;  Laterality: N/A;  . INSERTION OF DIALYSIS CATHETER Right 04/07/2016   Procedure: INSERTION OF DIALYSIS CATHETER;  Surgeon: Waynetta Sandy, MD;  Location: [place];  Service: Vascular;  Laterality: Right;  . INSERTION OF DIALYSIS CATHETER Right 04/04/2017   Procedure: INSERTION OF DIALYSIS CATHETER;  Surgeon: Waynetta Sandy, MD;  Location: [place];  Service: Vascular;  Laterality: Right;  . LIGATION OF ARTERIOVENOUS  FISTULA Left 04/14/2016   Procedure: LIGATION OF ARTERIOVENOUS  FISTULA;  Surgeon: Angelia Mould, MD;  Location: [place];  Service: Vascular;  Laterality: Left;  . PATCH ANGIOPLASTY Left 06/19/2016   Procedure: PATCH ANGIOPLASTY LEFT ARTERIOVENOUS FISTULA;  Surgeon: Angelia Mould, MD;  Location: [place];  Service: Vascular;  Laterality: Left;  . POLYPECTOMY  09/04/2017   Procedure: POLYPECTOMY;  Surgeon: Ronnette Juniper, MD;  Location: [facility]  ENDOSCOPY;  Service: Gastroenterology;;  . REVISON OF ARTERIOVENOUS FISTULA Left 06/19/2016   Procedure: REVISION SUPERFICIALIZATION OF BRACHIOCEPHALIC ARTERIOVENOUS FISTULA;  Surgeon: Angelia Mould, MD;  Location: [place];  Service: Vascular;  Laterality: Left;     A IV Location/Drains/Wounds Patient Lines/Drains/Airways Status   Active Line/Drains/Airways    Name:   Placement date:   Placement time:   Site:   Days:   Peripheral IV 08/23/18 Right Antecubital   08/23/18    1650    Antecubital   less than 1          Intake/Output Last 24 hours  Intake/Output Summary (Last 24 hours) at 08/23/2018 1844 Last data filed at 08/23/2018 1823 Gross per 24 hour  Intake 326 ml  Output -  Net 326 ml    Labs/Imaging Results for orders placed or performed during the hospital encounter of 08/23/18 (from the past 48 hour(s))  Comprehensive metabolic panel     Status: Abnormal   Collection Time: 08/23/18 12:37 PM  Result Value Ref Range   Sodium 140 135 - 145 mmol/L   Potassium 3.2 (L) 3.5 - 5.1 mmol/L   Chloride 98 98 - 111 mmol/L   CO2 19 (L) 22 - 32 mmol/L   Glucose, Bld 138 (H) 70 - 99 mg/dL  BUN 54 (H) 6 - 20 mg/dL   Creatinine, Ser 16.74 (H) 0.44 - 1.00 mg/dL   Calcium 8.0 (L) 8.9 - 10.3 mg/dL   Total Protein 7.6 6.5 - 8.1 g/dL   Albumin 3.7 3.5 - 5.0 g/dL   AST 30 15 - 41 U/L   ALT 20 0 - 44 U/L   Alkaline Phosphatase 55 38 - 126 U/L   Total Bilirubin 0.9 0.3 - 1.2 mg/dL   GFR calc non Af Amer 2 (L) >60 mL/min   GFR calc Af Amer 3 (L) >60 mL/min   Anion gap 23 (H) 5 - 15    Comment: Performed at [facility] [address], [place], [place] [address]  CBC     Status: Abnormal   Collection Time: 08/23/18 12:37 PM  Result Value Ref Range   WBC 9.2 4.0 - 10.5 K/uL   RBC 2.91 (L) 3.87 - 5.11 MIL/uL   Hemoglobin 8.4 (L) 12.0 - 15.0 g/dL   HCT 27.9 (L) 36.0 - 46.0 %   MCV 95.9 80.0 - 100.0 fL   MCH 28.9 26.0 - 34.0 pg   MCHC 30.1 30.0 - 36.0 g/dL   RDW 20.2 (H)  11.5 - 15.5 %   Platelets 367 150 - 400 K/uL   nRBC 2.1 (H) 0.0 - 0.2 %    Comment: Performed at [facility] [address] [place], [place] [address]  Type and screen [place]     Status: None (Preliminary result)   Collection Time: 08/23/18 12:48 PM  Result Value Ref Range   ABO/RH(D) O NEG    Antibody Screen NEG    Sample Expiration 08/26/2018,2359    Unit Number [id number]    Blood Component Type RBC LR PHER1    Unit division 00    Status of Unit ISSUED    Transfusion Status OK TO TRANSFUSE    Crossmatch Result      Compatible Performed at [facility], [place] [address] [place], [place] [address]   CBC     Status: Abnormal   Collection Time: 08/23/18  4:26 PM  Result Value Ref Range   WBC 9.3 4.0 - 10.5 K/uL   RBC 2.73 (L) 3.87 - 5.11 MIL/uL   Hemoglobin 7.9 (L) 12.0 - 15.0 g/dL   HCT 25.6 (L) 36.0 - 46.0 %   MCV 93.8 80.0 - 100.0 fL   MCH 28.9 26.0 - 34.0 pg   MCHC 30.9 30.0 - 36.0 g/dL   RDW 19.9 (H) 11.5 - 15.5 %   Platelets 364 150 - 400 K/uL   nRBC 1.4 (H) 0.0 - 0.2 %    Comment: Performed at [facility] [address] [place], [place] [address]  Prepare RBC     Status: None   Collection Time: 08/23/18  5:52 PM  Result Value Ref Range   Order Confirmation      ORDER PROCESSED BY BLOOD BANK Performed at [facility], [place] [address], [place], [place] [address]    No results found.  Pending Labs Unresulted Labs (From admission, onward)    Start     Ordered   08/24/18 0500  CBC  Tomorrow morning,   R     08/23/18 1841   08/24/18 0500  Renal function panel  Tomorrow morning,   R     08/23/18 1841   08/23/18 1825  Brain natriuretic peptide  Once,   STAT  08/23/18 1824   08/23/18 1824  Culture, blood (routine x 2)  BLOOD CULTURE X 2,   R (with STAT occurrences)     08/23/18 1824   08/23/18 1824  Ferritin  Once,   STAT     08/23/18 1824   08/23/18 1824  Iron and TIBC  Once,   STAT     08/23/18 1824    08/23/18 1753  SARS CORONAVIRUS 2 Nasal Swab Aptima Multi Swab  (Asymptomatic/Tier 2 Patients Labs)  Once,   STAT    Question Answer Comment  Is this test for diagnosis or screening Screening   Symptomatic for COVID-19 as defined by CDC No   Hospitalized for COVID-19 No   Admitted to ICU for COVID-19 No   Previously tested for COVID-19 No   Resident in a congregate (group) care setting No   Employed in healthcare setting No   Pregnant No      08/23/18 1752          Vitals/Pain Today's Vitals   08/23/18 1700 08/23/18 1823 08/23/18 1830 08/23/18 1838  BP:  136/78 (!) 141/71 (!) 142/73  Pulse: 71 72 72 76  Resp: $Remo'15 16 16 13  'RNBQX$ Temp:  98 F (36.7 C)  97.6 F (36.4 C)  TempSrc:  Oral  Oral  SpO2: 100%  100% 100%  PainSc:        Isolation Precautions No active isolations  Medications Medications  0.9 %  sodium chloride infusion (has no administration in time range)  heparin 1000 unit/ml injection 500 Units (has no administration in time range)  gentamicin cream (GARAMYCIN) 0.1 % 1 application (has no administration in time range)  dialysis solution 2.5% low-MG/low-CA dianeal solution (has no administration in time range)    Mobility walks Low fall risk   Focused Assessments    R Recommendations: See Admitting Provider Note  Report given to:   Additional Notes:

## 2018-08-23 NOTE — ED Triage Notes (Addendum)
Pt states she had a hemoglobin level of 6 on Monday and was unable to get a out patient appt for blood so had to come to ED for transfusion. Pt does home Peritoneal dialysis . Pt states she has been getting sob and cp with any exertion.

## 2018-08-23 NOTE — Consult Note (Signed)
Reason for Consult: ESRD  Referring Physician:  Dr. Lennice Sites  Chief Complaint: Dyspnea and fatigue   Assessment/Plan: 1. ESRD - unfortunately the oncall nurse is not certified to do PD and per [facility] policy the pt is not allowed to hook herself up; pt had requested to do so. She will be put on CCPD 1st thing in the AM. 2. HTN - restart home regimen and will monitor. 3. Renal osteodystrophy - will check a phos level to manage binders. 4. Anemia - s/p 1U, transfuse as needed. Will load with IV iron if appropriate pending studies. 5. 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    HPI: Patricia [name] is an 43 y.o. female  HTN GIB iron deficiency anemia ESRD on PD followed by Dr. Jimmy Footman p/w dizziness, dyspnea, CP on exertion only especially when climbing stairs for the past 3-4 days. She has not had any syncopal episodes, falls or trauma. She has had palpitations with HR in the 130's with exertion which then improves with rest. She has had anorexia and only eats once a day and has had intermittent n/v. She denies cough, fever, chills, sore throat. She is on PD with fill volumes of 3L on CCPD at night with a 4hr dwell before being on CCPD at night. She was noted in the ED to have a Hb of 8.4 and then 7.9; last transfusion was 08/2018. S/P 1U in the ED.  ROS Pertinent items are noted in HPI.  Chemistry and CBC: Creatinine, Ser  Date/Time Value Ref Range Status  08/23/2018 12:37 PM 16.74 (H) 0.44 - 1.00 mg/dL Final  07/11/2018 03:03 AM 17.95 (H) 0.44 - 1.00 mg/dL Final  07/06/2018 07:44 PM 16.48 (H) 0.44 - 1.00 mg/dL Final  05/23/2018 07:27 AM 16.88 (H) 0.44 - 1.00 mg/dL Final  05/22/2018 07:59 AM 16.86 (H) 0.44 - 1.00 mg/dL Final  05/21/2018 07:28 AM 16.07 (H) 0.44 - 1.00 mg/dL Final  05/20/2018 08:06 PM 15.78 (H) 0.44 - 1.00 mg/dL Final  05/20/2018 06:30 PM 15.37 (H) 0.44 - 1.00 mg/dL  Final  04/29/2018 08:23 PM 15.58 (H) 0.44 - 1.00 mg/dL Final  04/07/2018 11:50 AM 14.27 (H) 0.44 - 1.00 mg/dL Final  04/06/2018 04:44 AM 13.65 (H) 0.44 - 1.00 mg/dL Final  04/05/2018 09:43 AM 14.03 (H) 0.44 - 1.00 mg/dL Final  04/04/2018 05:24 AM 16.14 (H) 0.44 - 1.00 mg/dL Final  04/03/2018 08:55 PM 16.88 (H) 0.44 - 1.00 mg/dL Final  02/27/2018 04:30 AM 11.33 (H) 0.44 - 1.00 mg/dL Final  02/26/2018 04:01 AM 11.81 (H) 0.44 - 1.00 mg/dL Final  02/25/2018 10:25 AM 11.90 (H) 0.44 - 1.00 mg/dL Final  02/23/2018 11:17 AM 12.55 (H) 0.44 - 1.00 mg/dL Final  02/22/2018 03:57 AM 14.17 (H) 0.44 - 1.00 mg/dL Final  02/21/2018 01:05 PM 14.68 (H) 0.44 - 1.00 mg/dL Final  02/21/2018 05:55 AM 14.80 (H) 0.44 - 1.00 mg/dL Final  02/20/2018 03:03 AM 14.54 (H) 0.44 - 1.00 mg/dL Final  02/19/2018 06:05 AM 14.59 (H) 0.44 - 1.00 mg/dL Final  02/18/2018 06:55 PM 14.93 (H) 0.44 - 1.00 mg/dL Final  02/18/2018 01:01 PM 14.47 (H) 0.44 - 1.00 mg/dL Final  12/25/2017 02:36 AM 16.56 (H) 0.44 - 1.00 mg/dL Final  12/23/2017 07:30 AM 17.52 (H) 0.44 - 1.00 mg/dL Final  12/22/2017 08:24 PM 17.18 (H) 0.44 - 1.00 mg/dL Final  12/19/2017 05:53 AM 15.55 (H) 0.44 -  1.00 mg/dL Final  12/18/2017 06:36 PM 16.30 (H) 0.44 - 1.00 mg/dL Final  10/30/2017 04:38 AM 14.53 (H) 0.44 - 1.00 mg/dL Final  10/29/2017 05:13 AM 14.81 (H) 0.44 - 1.00 mg/dL Final  10/27/2017 11:09 PM 13.75 (H) 0.44 - 1.00 mg/dL Final  09/04/2017 06:40 AM 15.74 (H) 0.44 - 1.00 mg/dL Final  09/02/2017 05:24 AM 16.34 (H) 0.44 - 1.00 mg/dL Final  09/01/2017 08:09 PM 15.18 (H) 0.44 - 1.00 mg/dL Final  08/19/2017 03:48 AM 16.74 (H) 0.44 - 1.00 mg/dL Final  08/18/2017 10:04 AM 16.23 (H) 0.44 - 1.00 mg/dL Final  08/17/2017 05:14 AM 18.05 (H) 0.44 - 1.00 mg/dL Final  08/16/2017 12:00 PM 18.31 (H) 0.44 - 1.00 mg/dL Final  08/16/2017 04:52 AM 18.32 (H) 0.44 - 1.00 mg/dL Final  08/14/2017 06:21 PM 18.04 (H) 0.44 - 1.00 mg/dL Final  08/10/2017 03:55 AM 15.94 (H) 0.44 -  1.00 mg/dL Final  06/04/2017 05:17 AM 10.45 (H) 0.44 - 1.00 mg/dL Final    Comment:    DELTA CHECK NOTED DIALYSIS   06/03/2017 01:41 AM 16.89 (H) 0.44 - 1.00 mg/dL Final  06/02/2017 08:11 PM 17.08 (H) 0.44 - 1.00 mg/dL Final  04/06/2017 11:00 AM 9.21 (H) 0.44 - 1.00 mg/dL Final    Comment:    DELTA CHECK NOTED DIALYSIS   04/05/2017 04:37 AM 14.37 (H) 0.44 - 1.00 mg/dL Final  04/04/2017 08:19 AM 12.88 (H) 0.44 - 1.00 mg/dL Final  04/03/2017 10:01 PM 12.06 (H) 0.44 - 1.00 mg/dL Final  12/01/2016 01:46 PM 8.51 (H) 0.44 - 1.00 mg/dL Final  09/01/2016 10:10 PM 6.94 (H) 0.44 - 1.00 mg/dL Final   Recent Labs  Lab 08/23/18 1237  NA 140  K 3.2*  CL 98  CO2 19*  GLUCOSE 138*  BUN 54*  CREATININE 16.74*  CALCIUM 8.0*   Recent Labs  Lab 08/23/18 1237 08/23/18 1626  WBC 9.2 9.3  HGB 8.4* 7.9*  HCT 27.9* 25.6*  MCV 95.9 93.8  PLT 367 364   Liver Function Tests: Recent Labs  Lab 08/23/18 1237  AST 30  ALT 20  ALKPHOS 55  BILITOT 0.9  PROT 7.6  ALBUMIN 3.7   No results for input(s): LIPASE, AMYLASE in the last 168 hours. No results for input(s): AMMONIA in the last 168 hours. Cardiac Enzymes: No results for input(s): CKTOTAL, CKMB, CKMBINDEX, TROPONINI in the last 168 hours. Iron Studies: No results for input(s): IRON, TIBC, TRANSFERRIN, FERRITIN in the last 72 hours. PT/INR: $RemoveBefore'@LABRCNTIP'AgXfPMLkNJgxv$ (inr:5)  Xrays/Other Studies: ) Results for orders placed or performed during the hospital encounter of 08/23/18 (from the past 48 hour(s))  Comprehensive metabolic panel     Status: Abnormal   Collection Time: 08/23/18 12:37 PM  Result Value Ref Range   Sodium 140 135 - 145 mmol/L   Potassium 3.2 (L) 3.5 - 5.1 mmol/L   Chloride 98 98 - 111 mmol/L   CO2 19 (L) 22 - 32 mmol/L   Glucose, Bld 138 (H) 70 - 99 mg/dL   BUN 54 (H) 6 - 20 mg/dL   Creatinine, Ser 16.74 (H) 0.44 - 1.00 mg/dL   Calcium 8.0 (L) 8.9 - 10.3 mg/dL   Total Protein 7.6 6.5 - 8.1 g/dL   Albumin 3.7 3.5 - 5.0  g/dL   AST 30 15 - 41 U/L   ALT 20 0 - 44 U/L   Alkaline Phosphatase 55 38 - 126 U/L   Total Bilirubin 0.9 0.3 - 1.2 mg/dL   GFR calc non Af  Amer 2 (L) >60 mL/min   GFR calc Af Amer 3 (L) >60 mL/min   Anion gap 23 (H) 5 - 15    Comment: Performed at [facility] [address], [place], [place] [address]  CBC     Status: Abnormal   Collection Time: 08/23/18 12:37 PM  Result Value Ref Range   WBC 9.2 4.0 - 10.5 K/uL   RBC 2.91 (L) 3.87 - 5.11 MIL/uL   Hemoglobin 8.4 (L) 12.0 - 15.0 g/dL   HCT 27.9 (L) 36.0 - 46.0 %   MCV 95.9 80.0 - 100.0 fL   MCH 28.9 26.0 - 34.0 pg   MCHC 30.1 30.0 - 36.0 g/dL   RDW 20.2 (H) 11.5 - 15.5 %   Platelets 367 150 - 400 K/uL   nRBC 2.1 (H) 0.0 - 0.2 %    Comment: Performed at [facility] [address], [place], [place] [address]  Type and screen [place]     Status: None (Preliminary result)   Collection Time: 08/23/18 12:48 PM  Result Value Ref Range   ABO/RH(D) O NEG    Antibody Screen NEG    Sample Expiration 08/26/2018,2359    Unit Number [id number]    Blood Component Type RBC LR PHER1    Unit division 00    Status of Unit ISSUED    Transfusion Status OK TO TRANSFUSE    Crossmatch Result      Compatible Performed at [facility], [place] [address] [place], [place] [address]   CBC     Status: Abnormal   Collection Time: 08/23/18  4:26 PM  Result Value Ref Range   WBC 9.3 4.0 - 10.5 K/uL   RBC 2.73 (L) 3.87 - 5.11 MIL/uL   Hemoglobin 7.9 (L) 12.0 - 15.0 g/dL   HCT 25.6 (L) 36.0 - 46.0 %   MCV 93.8 80.0 - 100.0 fL   MCH 28.9 26.0 - 34.0 pg   MCHC 30.9 30.0 - 36.0 g/dL   RDW 19.9 (H) 11.5 - 15.5 %   Platelets 364 150 - 400 K/uL   nRBC 1.4 (H) 0.0 - 0.2 %    Comment: Performed at [facility] [address], [place], [place] [address]  Prepare RBC     Status: None   Collection Time: 08/23/18  5:52 PM  Result Value Ref Range   Order Confirmation      ORDER PROCESSED BY BLOOD  BANK Performed at [facility], [place] [address], [place],  [address]    Dg Chest Port 1 View  Result Date: 08/23/2018 CLINICAL DATA:  Dyspnea EXAM: PORTABLE CHEST 1 VIEW COMPARISON:  July 11, 2018 FINDINGS: Again noted is borderline cardiomegaly. Both lungs are clear. The visualized skeletal structures are unremarkable. IMPRESSION: No active disease. Electronically Signed   By: Constance Holster M.D.   On: 08/23/2018 19:13    PMH:   Past Medical History:  Diagnosis Date  . Acid reflux   . Anxiety   . Arthritis   . Dyspnea   . Dysrhythmia    tachycardia  . ESRD (end stage renal disease) ([place])    TTHSAT- [place]  . GI bleeding 12/2017  . Gout   . Headache(784.0)    "related to chemo; sometimes weekly" (09/12/2013)  . High cholesterol   . History of blood transfusion "a couple"   "related to low counts"  . Hypertension   . Iron deficiency anemia    "  get epogen shots q month" (02/20/2014)  . 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 Peritoneal dialysis status (HCC)   . Stroke (HCC) 08/15/2017    PSH:   Past Surgical History:  Procedure Laterality Date  . ABDOMINAL HYSTERECTOMY  2010   "laparoscopic"  . ANKLE FRACTURE SURGERY Right 1994  . AV FISTULA PLACEMENT Left 04/14/2016   Procedure: ARTERIOVENOUS (AV) FISTULA CREATION;  Surgeon: Chuck Hint, MD;  Location[facility] OR;  Service: Vascular;  Laterality: Left;  . AV FISTULA PLACEMENT Left 08/09/2017   Procedure: INSERTION OF ARTERIOVENOUS (AV) GORE-TEX GRAFT LEFT ARM;  Surgeon: Sherren Kerns, MD;  Location: [facility] OR;  Service: Vascular;  Laterality: Left;  . AVGG REMOVAL Left 08/11/2017   Procedure: REMOVAL OF ARTERIOVENOUS GORETEX GRAFT (AVGG);  Surgeon: Nada Libman, MD;  Location: [facility] OR;  Service: Vascular;  Laterality: Left;  . BIOPSY  09/03/2017   Procedure: BIOPSY;  Surgeon: Kathi Der, MD;  Location: [facility] ENDOSCOPY;  Service:  Gastroenterology;;  . BIOPSY  12/24/2017   Procedure: BIOPSY;  Surgeon: Lemar Lofty., MD;  Location: [facility] ENDOSCOPY;  Service: Gastroenterology;;  . CARDIAC CATHETERIZATION  2000's  . COLONOSCOPY WITH PROPOFOL N/A 09/04/2017   Procedure: COLONOSCOPY WITH PROPOFOL;  Surgeon: Kerin Salen, MD;  Location: [facility] ENDOSCOPY;  Service: Gastroenterology;  Laterality: N/A;  . ENTEROSCOPY N/A 12/24/2017   Procedure: ENTEROSCOPY;  Surgeon: Meridee Score Netty Starring., MD;  Location: [facility] ENDOSCOPY;  Service: Gastroenterology;  Laterality: N/A;  . ESOPHAGOGASTRODUODENOSCOPY    . ESOPHAGOGASTRODUODENOSCOPY (EGD) WITH PROPOFOL Left 06/04/2017   Procedure: ESOPHAGOGASTRODUODENOSCOPY (EGD) WITH PROPOFOL;  Surgeon: Willis Modena, MD;  Location: [facility] ENDOSCOPY;  Service: Endoscopy;  Laterality: Left;  . ESOPHAGOGASTRODUODENOSCOPY (EGD) WITH PROPOFOL N/A 09/03/2017   Procedure: ESOPHAGOGASTRODUODENOSCOPY (EGD) WITH PROPOFOL;  Surgeon: Kathi Der, MD;  Location: [facility] ENDOSCOPY;  Service: Gastroenterology;  Laterality: N/A;  . FISTULA SUPERFICIALIZATION Left 04/02/2017   Procedure: FISTULA SUPERFICIALIZATION LEFT ARM;  Surgeon: Nada Libman, MD;  Location: [facility] OR;  Service: Vascular;  Laterality: Left;  . FISTULOGRAM Left 04/07/2016   Procedure: FISTULOGRAM;  Surgeon: Maeola Harman, MD;  Location: [facility] OR;  Service: Vascular;  Laterality: Left;  . FRACTURE SURGERY     right ankle  . GIVENS CAPSULE STUDY N/A 10/29/2017   Procedure: GIVENS CAPSULE STUDY;  Surgeon: Charlott Rakes, MD;  Location: [facility] ENDOSCOPY;  Service: Endoscopy;  Laterality: N/A;  . GIVENS CAPSULE STUDY N/A 12/24/2017   Procedure: GIVENS CAPSULE STUDY;  Surgeon: Lemar Lofty., MD;  Location: [facility] ENDOSCOPY;  Service: Gastroenterology;  Laterality: N/A;  . GIVENS CAPSULE STUDY N/A 05/22/2018   Procedure: GIVENS CAPSULE STUDY;  Surgeon: Lemar Lofty., MD;  Location: [facility] ENDOSCOPY;  Service: Gastroenterology;  Laterality:  N/A;  . INSERTION OF DIALYSIS CATHETER Right 04/07/2016   Procedure: INSERTION OF DIALYSIS CATHETER;  Surgeon: Maeola Harman, MD;  Location: [facility] OR;  Service: Vascular;  Laterality: Right;  . INSERTION OF DIALYSIS CATHETER Right 04/04/2017   Procedure: INSERTION OF DIALYSIS CATHETER;  Surgeon: Maeola Harman, MD;  Location: [facility] OR;  Service: Vascular;  Laterality: Right;  . LIGATION OF ARTERIOVENOUS  FISTULA Left 04/14/2016   Procedure: LIGATION OF ARTERIOVENOUS  FISTULA;  Surgeon: Chuck Hint, MD;  Location: [facility] OR;  Service: Vascular;  Laterality: Left;  . PATCH ANGIOPLASTY Left 06/19/2016   Procedure: PATCH ANGIOPLASTY LEFT ARTERIOVENOUS FISTULA;  Surgeon: Chuck Hint, MD;  Location: [facility] OR;  Service: Vascular;  Laterality: Left;  . POLYPECTOMY  09/04/2017   Procedure: POLYPECTOMY;  Surgeon: Ronnette Juniper, MD;  Location: [facility] ENDOSCOPY;  Service: Gastroenterology;;  . REVISON OF ARTERIOVENOUS FISTULA Left 06/19/2016   Procedure: REVISION SUPERFICIALIZATION OF BRACHIOCEPHALIC ARTERIOVENOUS FISTULA;  Surgeon: Angelia Mould, MD;  Location: [place];  Service: Vascular;  Laterality: Left;    Allergies:  Allergies  Allergen Reactions  . Nsaids Other (See Comments)    Cannot take due to Kidney disease/Kidney function   . Tolmetin Other (See Comments)    Cannot take due to Kidney Disease    Medications:   Prior to Admission medications   Medication Sig Start Date End Date Taking? Authorizing Provider  allopurinol (ZYLOPRIM) 100 MG tablet Take 100 mg by mouth at bedtime.    Yes [provider]  ALPRAZolam Duanne Moron) 1 MG tablet Take 1 mg by mouth 2 (two) times daily as needed for anxiety.  03/19/17  Yes [provider]  cephALEXin (KEFLEX) 500 MG capsule Take 500 mg by mouth 2 (two) times daily. 08/17/18  Yes [provider]  gentamicin ointment (GARAMYCIN) 0.1 % Apply 1 application topically daily as needed (changing dressing.).   Yes  [provider]  hydrOXYzine (ATARAX/VISTARIL) 25 MG tablet Take 25 mg by mouth every 6 (six) hours as needed for itching.  01/25/18  Yes [provider]  lisinopril (ZESTRIL) 20 MG tablet Take 20 mg by mouth at bedtime. 06/09/18  Yes [provider]  meclizine (ANTIVERT) 12.5 MG tablet Take 12.5 mg by mouth 2 (two) times daily as needed for dizziness. 08/17/18  Yes [provider]  metoprolol tartrate (LOPRESSOR) 25 MG tablet Take 25 mg by mouth at bedtime.  04/17/16  Yes [provider]  multivitamin (RENA-VIT) TABS tablet Take 1 tablet by mouth at bedtime.    Yes [provider]  Omega-3 Fatty Acids (FISH OIL PO) Take 1 tablet by mouth daily.   Yes [provider]  omeprazole (PRILOSEC) 40 MG capsule Take 40 mg by mouth 2 (two) times daily.   Yes [provider]  pravastatin (PRAVACHOL) 20 MG tablet Take 20 mg by mouth at bedtime. 06/24/18  Yes [provider]  prochlorperazine (COMPAZINE) 10 MG tablet Take 1 tablet (10 mg total) by mouth every 8 (eight) hours as needed for nausea or vomiting. 02/17/18  Yes Mansouraty, Telford Nab., MD  promethazine (PHENERGAN) 25 MG tablet Take 1 tablet (25 mg total) by mouth every 8 (eight) hours as needed for nausea or vomiting. 04/07/18  Yes Enid Derry, [place], DO  sucroferric oxyhydroxide (VELPHORO) 500 MG chewable tablet Chew 1,000 mg by mouth 3 (three) times daily before meals.    Yes [provider]  venlafaxine XR (EFFEXOR-XR) 75 MG 24 hr capsule Take 75 mg by mouth at bedtime.    Yes [provider]  calcitRIOL (ROCALTROL) 0.5 MCG capsule Take 1 capsule (0.5 mcg total) by mouth daily with breakfast. Patient not taking: Reported on 08/23/2018 06/04/17   Bonnielee Haff, MD  HYDROcodone-homatropine  [facility]) 5-1.5 MG/5ML syrup Take 5 mLs by mouth every 6 (six) hours as needed for cough. Patient not taking: Reported on 08/23/2018 07/07/18   Mesner, Corene Cornea, MD  methocarbamol  (ROBAXIN) 500 MG tablet Take 1 tablet (500 mg total) by mouth every 8 (eight) hours as needed for muscle spasms. Patient not taking: Reported on 08/23/2018 07/07/18   Mesner, Corene Cornea, MD    Discontinued Meds:   Medications Discontinued During This Encounter  Medication Reason  .  atorvastatin (LIPITOR) 40 MG tablet No longer needed (for PRN medications)    Social History:  reports that she has never smoked. She has never used smokeless tobacco. She reports that she does not drink alcohol or use drugs.  Family History:   Family History  Problem Relation Age of Onset  . Hypertension Mother   . Thyroid disease Mother   . Coronary artery disease Father   . Hypertension Father   . Diabetes Father   . Colon cancer Maternal Aunt   . Esophageal cancer Maternal Uncle   . Inflammatory bowel disease Neg Hx   . Liver disease Neg Hx   . Pancreatic cancer Neg Hx   . Rectal cancer Neg Hx   . Stomach cancer Neg Hx     Blood pressure 130/88, pulse 78, temperature 97.9 F (36.6 C), temperature source Oral, resp. rate 18, height $RemoveBe'5\' 7"'ObjaIxjua$  (1.702 m), weight 121.6 kg, SpO2 100 %. General appearance: alert, cooperative and appears stated age Head: Normocephalic, without obvious abnormality, atraumatic Eyes: Conjunctival pallor Neck: no adenopathy, no carotid bruit, supple, symmetrical, trachea midline and thyroid not enlarged, symmetric, no tenderness/mass/nodules Back: symmetric, no curvature. ROM normal. No CVA tenderness. Resp: clear to auscultation bilaterally Chest wall: no tenderness Cardio: S1, S2 normal GI: Obese, left sided Tenckhoff Extremities: extremities normal, atraumatic, no cyanosis or edema Pulses: 2+ and symmetric Skin: Skin color, texture, turgor normal. No rashes or lesions       Lezlee Gills, Hunt Oris, MD 08/23/2018, 9:05 PM

## 2018-08-24 ENCOUNTER — Other Ambulatory Visit: Payer: Self-pay

## 2018-08-24 DIAGNOSIS — F339 Major depressive disorder, recurrent, unspecified: Secondary | ICD-10-CM

## 2018-08-24 DIAGNOSIS — Z992 Dependence on renal dialysis: Secondary | ICD-10-CM

## 2018-08-24 DIAGNOSIS — K449 Diaphragmatic hernia without obstruction or gangrene: Secondary | ICD-10-CM

## 2018-08-24 DIAGNOSIS — F419 Anxiety disorder, unspecified: Secondary | ICD-10-CM

## 2018-08-24 DIAGNOSIS — Z79899 Other long term (current) drug therapy: Secondary | ICD-10-CM

## 2018-08-24 DIAGNOSIS — D649 Anemia, unspecified: Secondary | ICD-10-CM

## 2018-08-24 DIAGNOSIS — N186 End stage renal disease: Secondary | ICD-10-CM

## 2018-08-24 DIAGNOSIS — I120 Hypertensive chronic kidney disease with stage 5 chronic kidney disease or end stage renal disease: Principal | ICD-10-CM

## 2018-08-24 DIAGNOSIS — Z8719 Personal history of other diseases of the digestive system: Secondary | ICD-10-CM

## 2018-08-24 DIAGNOSIS — E876 Hypokalemia: Secondary | ICD-10-CM

## 2018-08-24 DIAGNOSIS — E7800 Pure hypercholesterolemia, unspecified: Secondary | ICD-10-CM

## 2018-08-24 DIAGNOSIS — K5730 Diverticulosis of large intestine without perforation or abscess without bleeding: Secondary | ICD-10-CM

## 2018-08-24 DIAGNOSIS — Z9889 Other specified postprocedural states: Secondary | ICD-10-CM

## 2018-08-24 LAB — RENAL FUNCTION PANEL
Albumin: 3.2 g/dL — ABNORMAL LOW (ref 3.5–5.0)
Anion gap: 20 — ABNORMAL HIGH (ref 5–15)
BUN: 61 mg/dL — ABNORMAL HIGH (ref 6–20)
CO2: 20 mmol/L — ABNORMAL LOW (ref 22–32)
Calcium: 7.8 mg/dL — ABNORMAL LOW (ref 8.9–10.3)
Chloride: 98 mmol/L (ref 98–111)
Creatinine, Ser: 17.65 mg/dL — ABNORMAL HIGH (ref 0.44–1.00)
GFR calc Af Amer: 2 mL/min — ABNORMAL LOW (ref >60)
GFR calc non Af Amer: 2 mL/min — ABNORMAL LOW (ref >60)
Glucose, Bld: 142 mg/dL — ABNORMAL HIGH (ref 70–99)
Phosphorus: 5.9 mg/dL — ABNORMAL HIGH (ref 2.5–4.6)
Potassium: 2.9 mmol/L — ABNORMAL LOW (ref 3.5–5.1)
Sodium: 138 mmol/L (ref 135–145)

## 2018-08-24 LAB — CBC
HCT: 27.1 % — ABNORMAL LOW (ref 36.0–46.0)
Hemoglobin: 8.7 g/dL — ABNORMAL LOW (ref 12.0–15.0)
MCH: 29.5 pg (ref 26.0–34.0)
MCHC: 32.1 g/dL (ref 30.0–36.0)
MCV: 91.9 fL (ref 80.0–100.0)
Platelets: 336 10*3/uL (ref 150–400)
RBC: 2.95 MIL/uL — ABNORMAL LOW (ref 3.87–5.11)
RDW: 18.5 % — ABNORMAL HIGH (ref 11.5–15.5)
WBC: 8 10*3/uL (ref 4.0–10.5)
nRBC: 0.9 % — ABNORMAL HIGH (ref 0.0–0.2)

## 2018-08-24 LAB — TYPE AND SCREEN
ABO/RH(D): O NEG
Antibody Screen: NEGATIVE
Unit division: 0

## 2018-08-24 LAB — BPAM RBC
Blood Product Expiration Date: 202008222359
ISSUE DATE / TIME: 202008181809
Unit Type and Rh: 9500

## 2018-08-24 LAB — SARS CORONAVIRUS 2 (TAT 6-24 HRS): SARS Coronavirus 2: NEGATIVE

## 2018-08-24 MED ORDER — POTASSIUM CHLORIDE CRYS ER 20 MEQ PO TBCR
30.0000 meq | EXTENDED_RELEASE_TABLET | Freq: Two times a day (BID) | ORAL | Status: DC
  Administered 2018-08-24 – 2018-08-26 (×5): 30 meq via ORAL
  Filled 2018-08-24 (×5): qty 1

## 2018-08-24 NOTE — Progress Notes (Addendum)
  Date: 08/24/2018  [name]  Medical record number: [id number]  Date of birth: [birth year]   I have seen and evaluated this patient and I have discussed the plan of care with the house staff. Please see their note for complete details. I concur with their findings with the following additions/corrections:   43 yo woman with ESRD on PD and recurrent GI bleeding with small bowel telangiectasias admitted with shortness of breath, dizziness, and exertional chest pain.  She says this felt similar to prior episodes of acute blood loss anemia.  She also noted tachycardia up to 130 on her home BP monitor.  She was seen in clinic the day before admission and had a Hgb of 6.7 (not documented in our system).  1.  Acute on chronic anemia: initial Hgb here was 7.9, and she was transfused 1 unit of PRBCs for symptomatic anemia.  Iron studies show elevated ferritin, elevated T sat, most consistent with anemia of chronic disease.  May have been a component of blood loss, but does not appear to be actively bleeding as much as she has on prior admissions. - We will discuss her case with her GI doctors, but suspect she does not need acute evaluation here unless she develops more significant bleeding.  Her telangiectasias are very difficult to access and she has required push endoscopy in the past -Will check symptoms now that she has had her transfusion, if resolved and H/H stable after transfusion, potential discharge tomorrow  2.  ESRD on PD: Appreciate nephrology consultation, was not able to get PD overnight last night, receiving PD this morning during our evaluation.  No acute issues, continue PD  3.  Hypokalemia: Repleting orally  Lenice Pressman, M.D., Ph.D. 08/24/2018, 3:07 PM

## 2018-08-24 NOTE — Plan of Care (Signed)
  Problem: Education: Goal: Knowledge of General Education information will improve Description Including pain rating scale, medication(s)/side effects and non-pharmacologic comfort measures Outcome: Progressing   Problem: Health Behavior/Discharge Planning: Goal: Ability to manage health-related needs will improve Outcome: Progressing   

## 2018-08-24 NOTE — Progress Notes (Signed)
When patient was ambulating in room, patients heart rate got up into the 170's and patient was complaining of dizziness and shortness of breath. Patient helped back to bed. Heart rate is now in the 90's and patient states that her shortness of breath and dizziness have improved. Will continue to monitor.

## 2018-08-24 NOTE — Progress Notes (Signed)
Subjective: Patient evaluated at bedside this morning. She is lying comfortably in bed, peritoneal dialysis session in progress. She reports no acute concerns at this moment and has not had a BM yet or ambulated yet.   Objective:  Vital signs in last 24 hours: Vitals:   08/23/18 2023 08/24/18 0408 08/24/18 0651 08/24/18 0931  BP: 130/88 125/73 (!) 124/58 123/82  Pulse: 78 78 71 81  Resp: 18 18  18   Temp: 97.9 F (36.6 C) 98.9 F (37.2 C) 98.4 F (36.9 C) 98.3 F (36.8 C)  TempSrc: Oral Oral Oral Oral  SpO2: 100% 98% 100% 98%  Weight: 121.6 kg  121.2 kg   Height: 5\' 7"  (1.702 m)      Physical Exam  Constitutional: She is oriented to person, place, and time and well-developed, well-nourished, and in no distress.  Eyes:  Pale conjunctiva   Cardiovascular: Normal rate, regular rhythm, normal heart sounds and intact distal pulses. Exam reveals no gallop and no friction rub.  No murmur heard. Pulmonary/Chest: Effort normal and breath sounds normal. No respiratory distress. She has no wheezes. She has no rales.  Abdominal: Soft. Bowel sounds are normal. She exhibits no distension. There is no abdominal tenderness.  Musculoskeletal: Normal range of motion.        General: No tenderness, deformity or edema.  Neurological: She is alert and oriented to person, place, and time. No cranial nerve deficit.     Assessment/Plan:  Active Problems:   GI bleed  Symptomatic anemia:  Patient  presented with generalized weakness, fatigue, dizziness, dsypnea and chest pain on exertion for 3-4 days. A few episodes of dark stools, subjective chills and night sweats also reported. Patient reported similar symptoms in past requiring blood transfusion. Per patient, Hb 6.7 at outpatient lab day prior to admission. However, Hb ~8 during admission. Patient received 1u pRBC. Hb 8.7 this AM. Patient reports improvement in symptoms.  Blood cx pending. No leukocytosis on labs and patient remains  afebrile.TSH and BNP wnl. Iron studies: Fe 91, TIBC 263, Ferritin 1320.   - Ambulation after dialysis  - Continue to monitor  Hx of GI Bleeds:  Hx of small bowel AVMs s/p cauterization at [facility] in 06/2018. On prilosec 40mg  bid.  Patient reports two days of dark stools but did not notice frank hematochezia. Unable to get stool sample/FOBT.  Per GI, patient will require close follow up as outpatient and in absence of acute GI bleed, no benefit in scoping her during this hospitalization. If anemia persists, recommend follow up with GI at [facility].  Previous workup:  EGD 02/18/2018:No gross lesions in esophagus. 3 cm hiatal hernia. Gastroesophageal flap valve classified as Hill Grade IV.Previous ulcer has healed.- No gross lesions in the duodenal bulb, in the first portion of the duodenum, in the second portion of the duodenum, in the third portion of the duodenum and in the fourth portion of the duodenum. Prominent Ampulla. Colonoscopy 09/04/2017: patch area of moderately congested and erythematous mucosa in the sigmoid, transverse and ascending colon, mucosal friability in the ascending colon, minimal fresh blood in the cecum, no active bleeding on thorough lavage. Diverticulosis sigmoid and descending colon. Capsule endoscopy 05/22/2018:Impression:Small bowel AVMs likely in the mid/distalileum.No active bleeding. Beyond the reach of conventional push enteroscopy.  - FOBT  - Continue Pantoprazole 40mg  bid  - Close f/u with Dr. Rush Landmark on d/c   ESRD on peritoneal dialysis: Patient with Hx of ESRD on peritoneal dialysis nightly. Possible transplant candidate. Dry weight is  258lbs per patient. Pt is currently 121 kg (266lb). Patient on peritoneal dialysis this AM. K 2.9 this AM, per nephrology, will do PO replacement.    - Continue peritoneal dialysis per nephrology - Strict I/O and daily weights - Continue Rena-vit qd  - Continue Sucroferric oxyhydroxide 1000mg  tid   Hx of Iron deficiency  anemia:  Per chart review, this is due to chronic blood loss. She is followed by nephrology and reports she has received EPO shot last week. Per patient, she had Hb 6.7 day prior to admission but in ED was 8.4 and 7.9 on repeat. Patient reports that she has had symptomatic anemia in the past requiring blood transfusion. Patient received 1u pRBC and Hb this AM 8.7 MCV 91.9.  Iron studies: Fe 91, TIBC 263, Ferritin 1320. Pt does not currently require IV iron.  Per GI, if her anemia persistently worsens, will recommend for follow up at [place] where she had small bowel AVMs cauterized.   - Continue to monitor   Hypertension:  Pt on metoprolol 25mg  and lisinopril 20mg  qd.  -Continue home meds   Hypercholesterolemia:  Pt on pravastatin 20mg  qd. - Continue home meds  Depression/Anxiety:  Pt on xanax  1mg  bid for anxiety and venlafaxine 75mg  qd.  - Continue home meds   Dispo: Anticipated discharge in approximately 1-2 day(s).   Harvie Heck, MD  Internal Medicine, PGY-1 08/24/2018, 11:42 AM Pager: [phone]

## 2018-08-24 NOTE — Progress Notes (Addendum)
Subjective:   Seen in room  CCPD tolerating , no new cos , slightly weak feeling   Objective Vital signs in last 24 hours: Vitals:   08/23/18 2023 08/24/18 0408 08/24/18 0651 08/24/18 0931  BP: 130/88 125/73 (!) 124/58 123/82  Pulse: 78 78 71 81  Resp: $Remo'18 18  18  'mNenF$ Temp: 97.9 F (36.6 C) 98.9 F (37.2 C) 98.4 F (36.9 C) 98.3 F (36.8 C)  TempSrc: Oral Oral Oral Oral  SpO2: 100% 98% 100% 98%  Weight: 121.6 kg  121.2 kg   Height: $Remove'5\' 7"'cWvyPpq$  (1.702 m)      Weight change:   Physical Exam: General: alert ,obese female NAD  Heart: RRR no m,r,g  Lungs: CTA, non labored breathing  Abdomen: Obese/ Left quad Perm cath  Extremities: no pedal edema    Problem/Plan: 1. ESRD - CCPD  toleratriing use 2.5 bags today / k 2.9  Po k replacement  2. GI bleed - eval  Pending/per primary but has had colonoscopy and EGD with AVM's in the ileum, friable mucosal in the [facility], diverticulosis and prior gastric ulcers. 3. Anemia - sp 1 u prbcs  HGB 8.7 / TFS 35%// noted given ESA= Mircera $RemoveBefo'225mg'TIesKfCDRox$   08/17/18  At her op PD dept . 4. HTN/volume - CXR w no active dz and no sig vol on exam   5. Secondary hyperparathyroidism - phos 5.9  Continue phos binders= velphora  when po diet    Ernest Haber, PA-C [place] [phone] 08/24/2018,1:13 PM  LOS: 1 day   Pt seen, examined and agree w A/P as above.  Kelly Splinter  MD 08/24/2018, 1:49 PM    Labs: Basic Metabolic Panel: Recent Labs  Lab 08/23/18 1237 08/24/18 0822  NA 140 138  K 3.2* 2.9*  CL 98 98  CO2 19* 20*  GLUCOSE 138* 142*  BUN 54* 61*  CREATININE 16.74* 17.65*  CALCIUM 8.0* 7.8*  PHOS  --  5.9*   Liver Function Tests: Recent Labs  Lab 08/23/18 1237 08/24/18 0822  AST 30  --   ALT 20  --   ALKPHOS 55  --   BILITOT 0.9  --   PROT 7.6  --   ALBUMIN 3.7 3.2*   No results for input(s): LIPASE, AMYLASE in the last 168 hours. No results for input(s): AMMONIA in the last 168 hours. CBC: Recent Labs  Lab  08/23/18 1237 08/23/18 1626 08/24/18 0822  WBC 9.2 9.3 8.0  HGB 8.4* 7.9* 8.7*  HCT 27.9* 25.6* 27.1*  MCV 95.9 93.8 91.9  PLT 367 364 336   Cardiac Enzymes: No results for input(s): CKTOTAL, CKMB, CKMBINDEX, TROPONINI in the last 168 hours. CBG: No results for input(s): GLUCAP in the last 168 hours.  Studies/Results: Dg Chest Port 1 View  Result Date: 08/23/2018 CLINICAL DATA:  Dyspnea EXAM: PORTABLE CHEST 1 VIEW COMPARISON:  July 11, 2018 FINDINGS: Again noted is borderline cardiomegaly. Both lungs are clear. The visualized skeletal structures are unremarkable. IMPRESSION: No active disease. Electronically Signed   By: Constance Holster M.D.   On: 08/23/2018 19:13   Medications: . sodium chloride    . dialysis solution 2.5% low-MG/low-CA     . gentamicin cream  1 application Topical Daily  . metoprolol tartrate  25 mg Oral QHS  . multivitamin  1 tablet Oral QHS  . pantoprazole  40 mg Oral BID  . potassium chloride  30 mEq Oral BID  . pravastatin  20 mg Oral QHS  .  sucroferric oxyhydroxide  1,000 mg Oral TID AC  . venlafaxine XR  75 mg Oral QHS

## 2018-08-24 NOTE — Progress Notes (Signed)
New Admission Note: ? Arrival Method: via stretcher  Mental Orientation: A/O x 4 Telemetry: Box # 19 NSR Assessment: Completed Skin: Refer to flowsheet IV: Right AC Pain: none Tubes:  Safety Measures: Safety Fall Prevention Plan discussed with patient. Admission: Completed [facility] [place] Orientation: Patient has been orientated to the room, unit and the staff. Family: Orders have been reviewed and are being implemented. Will continue to monitor the patient. Call light has been placed within reach and bed alarm has been activated.  ? [employer], [place]

## 2018-08-25 DIAGNOSIS — D62 Acute posthemorrhagic anemia: Secondary | ICD-10-CM

## 2018-08-25 LAB — BLOOD CULTURE ID PANEL (REFLEXED)

## 2018-08-25 LAB — RENAL FUNCTION PANEL
Albumin: 3.1 g/dL — ABNORMAL LOW (ref 3.5–5.0)
Anion gap: 18 — ABNORMAL HIGH (ref 5–15)
BUN: 55 mg/dL — ABNORMAL HIGH (ref 6–20)
CO2: 21 mmol/L — ABNORMAL LOW (ref 22–32)
Calcium: 8 mg/dL — ABNORMAL LOW (ref 8.9–10.3)
Chloride: 96 mmol/L — ABNORMAL LOW (ref 98–111)
Creatinine, Ser: 16.14 mg/dL — ABNORMAL HIGH (ref 0.44–1.00)
GFR calc Af Amer: 3 mL/min — ABNORMAL LOW (ref >60)
GFR calc non Af Amer: 2 mL/min — ABNORMAL LOW (ref >60)
Glucose, Bld: 102 mg/dL — ABNORMAL HIGH (ref 70–99)
Phosphorus: 5.9 mg/dL — ABNORMAL HIGH (ref 2.5–4.6)
Potassium: 3.1 mmol/L — ABNORMAL LOW (ref 3.5–5.1)
Sodium: 135 mmol/L (ref 135–145)

## 2018-08-25 LAB — CBC
HCT: 24.5 % — ABNORMAL LOW (ref 36.0–46.0)
Hemoglobin: 8 g/dL — ABNORMAL LOW (ref 12.0–15.0)
MCH: 29.4 pg (ref 26.0–34.0)
MCHC: 32.7 g/dL (ref 30.0–36.0)
MCV: 90.1 fL (ref 80.0–100.0)
Platelets: 318 10*3/uL (ref 150–400)
RBC: 2.72 MIL/uL — ABNORMAL LOW (ref 3.87–5.11)
RDW: 18.6 % — ABNORMAL HIGH (ref 11.5–15.5)
WBC: 7.1 10*3/uL (ref 4.0–10.5)
nRBC: 0.7 % — ABNORMAL HIGH (ref 0.0–0.2)

## 2018-08-25 LAB — OCCULT BLOOD X 1 CARD TO LAB, STOOL: Fecal Occult Bld: NEGATIVE

## 2018-08-25 LAB — HEPATITIS B SURFACE ANTIGEN: Hepatitis B Surface Ag: NEGATIVE

## 2018-08-25 MED ORDER — ACETAMINOPHEN 500 MG PO TABS
1000.0000 mg | ORAL_TABLET | Freq: Once | ORAL | Status: AC
  Administered 2018-08-25: 1000 mg via ORAL

## 2018-08-25 MED ORDER — SODIUM CHLORIDE 0.9 % IV SOLN
INTRAVENOUS | Status: DC
  Administered 2018-08-25 – 2018-08-26 (×2): via INTRAVENOUS

## 2018-08-25 MED ORDER — NEPRO/CARBSTEADY PO LIQD: 237.0000 mL | Freq: Three times a day (TID) | ORAL | Status: DC

## 2018-08-25 NOTE — Progress Notes (Signed)
Orthostatic Vital Signs  Lying: -B/P: 140/84  -HR: 85  Sitting: -B/P: 140/106  -HR: 95  Standing (0 minutes):  -B/P: 141/97 -HR: 97  Standing (3 minutes): -B/P: 137/89 -HR: 92

## 2018-08-25 NOTE — Progress Notes (Signed)
  Date: 08/25/2018  Patient name: Destyne Goodreau  Medical record number: [id number]  Date of birth: 14-Jan-1975   I have seen and evaluated this patient and I have discussed the plan of care with the house staff. Please see their note for complete details. I concur with their findings with the following additions/corrections:   Ms. Longo had an episode of lightheadedness and shortness of breath yesterday when getting around the room.  Although her heart rate was documented as 170 at the time, and review of telemetry shows that this was likely artifact and that her actual heart rate was likely closer to 100.  Nevertheless, she did experience palpitations at the time.  Her Hgb, which initially spotted 8.7 after transfusion, is now down to 8.0 again today.  Unclear if she is continuing to lose blood or having an inadequate hematopoietic response.  Appreciate ongoing nephrology consultation, Dr. Jonnie Finner is questioning whether her symptoms may be related to inadequate dialysis from her PD.  We will continue to check her renal panel and CBC daily and nephrology may consider switching to HD if her labs do not improve.  Lenice Pressman, M.D., Ph.D. 08/25/2018, 4:24 PM

## 2018-08-25 NOTE — Progress Notes (Signed)
PHARMACY - PHYSICIAN COMMUNICATION CRITICAL VALUE ALERT - BLOOD CULTURE IDENTIFICATION (BCID)  Patricia [name] is an 43 y.o. female who presented to [facility] on 08/23/2018 with a chief complaint of SOB, chest pain and generalized weakness. Currently being worked up for GI bleed.  Assessment:  BCID positive for coag neg staph MecA detected in 1 bottle, likely contaminant.  Name of physician (or Provider) Contacted: Dr. Rebeca Alert, MD  Current antibiotics: None  Changes to prescribed antibiotics recommended:  Recommendations accepted by provider  No antibiotics   Results for orders placed or performed during the hospital encounter of 08/23/18  Blood Culture ID Panel (Reflexed) (Collected: 08/23/2018  9:15 PM)  Result Value Ref Range   Enterococcus species NOT DETECTED NOT DETECTED   Listeria monocytogenes NOT DETECTED NOT DETECTED   Staphylococcus species DETECTED (A) NOT DETECTED   Staphylococcus aureus (BCID) NOT DETECTED NOT DETECTED   Methicillin resistance DETECTED (A) NOT DETECTED   Streptococcus species NOT DETECTED NOT DETECTED   Streptococcus agalactiae NOT DETECTED NOT DETECTED   Streptococcus pneumoniae NOT DETECTED NOT DETECTED   Streptococcus pyogenes NOT DETECTED NOT DETECTED   Acinetobacter baumannii NOT DETECTED NOT DETECTED   Enterobacteriaceae species NOT DETECTED NOT DETECTED   Enterobacter cloacae complex NOT DETECTED NOT DETECTED   Escherichia coli NOT DETECTED NOT DETECTED   Klebsiella oxytoca NOT DETECTED NOT DETECTED   Klebsiella pneumoniae NOT DETECTED NOT DETECTED   Proteus species NOT DETECTED NOT DETECTED   Serratia marcescens NOT DETECTED NOT DETECTED   Haemophilus influenzae NOT DETECTED NOT DETECTED   Neisseria meningitidis NOT DETECTED NOT DETECTED   Pseudomonas aeruginosa NOT DETECTED NOT DETECTED   Candida albicans NOT DETECTED NOT DETECTED   Candida glabrata NOT DETECTED NOT DETECTED   Candida krusei NOT DETECTED NOT DETECTED   Candida  parapsilosis NOT DETECTED NOT DETECTED   Candida tropicalis NOT DETECTED NOT DETECTED    Phillis Haggis 08/25/2018  10:42 AM

## 2018-08-25 NOTE — Progress Notes (Addendum)
Subjective:  Reports am Nausea for several weeks or months , ? component of Uremia     Objective Vital signs in last 24 hours: Vitals:   08/24/18 2253 08/24/18 2315 08/25/18 0529 08/25/18 0812  BP: 137/77 135/75 140/78 127/74  Pulse: 79 78 80 79  Resp: $Remo'17 18 18 16  'TKuWI$ Temp: 98.6 F (37 C) 98.6 F (37 C) 98.7 F (37.1 C) 97.7 F (36.5 C)  TempSrc: Oral Oral Oral Oral  SpO2: 99% 99% 100% 99%  Weight:      Height:       Weight change: -0.309 kg  Physical Exam: General: alert ,obese female NAD  Heart: RRR no m,r,g  Lungs: CTA, non labored breathing  Abdomen: Obese/ Left quad Perm cath with PD cath fluids infusing  Extremities: no pedal edema    Problem/Plan: 1. ESRD with Uremia symptoms Nausea weakness ,Dr Jonnie Finner dw Dr Deterting who follows on CCPD =if labs and symptoms don't improve with serial CCPD in hospital needs trial of HD  -   tolerating CCPD so far  No abd pain / k 3.1  Po k replacement / Scr 16.74 admit . 17.65 >16.14  This am after pd yesterday and evening   2. GI bleed - eval /per primary/ GI   noted   has had past  colonoscopy and EGD with AVM's in the ileum, friable mucosal in the [facility], diverticulosis and prior gastric ulcers. 3. Anemia - sp 1 u prbcs  HGB 8.7 >8.0 this am / TFS 35%// noted given ESA= Mircera $RemoveBefo'225mg'InSHdWCjeCT$   08/17/18   At her op PD dept . 4. HTN/volume - CXR w no active dz and no sig vol on exam  appears dry  Will give some iv fluids ,also changes to reg diet with phos binder using 1.5 % pd  5. Secondary hyperparathyroidism - phos 5.9  Continue phos binders= velphora with  po diet  Ernest Haber, PA-C [place] [phone] 08/25/2018,11:27 AM  LOS: 2 days   Pt seen, examined and agree w A/P as above.  Kelly Splinter  MD 08/25/2018, 2:18 PM    Labs: Basic Metabolic Panel: Recent Labs  Lab 08/23/18 1237 08/24/18 0822 08/25/18 0423  NA 140 138 135  K 3.2* 2.9* 3.1*  CL 98 98 96*  CO2 19* 20* 21*  GLUCOSE 138* 142* 102*  BUN 54* 61*  55*  CREATININE 16.74* 17.65* 16.14*  CALCIUM 8.0* 7.8* 8.0*  PHOS  --  5.9* 5.9*   Liver Function Tests: Recent Labs  Lab 08/23/18 1237 08/24/18 0822 08/25/18 0423  AST 30  --   --   ALT 20  --   --   ALKPHOS 55  --   --   BILITOT 0.9  --   --   PROT 7.6  --   --   ALBUMIN 3.7 3.2* 3.1*   No results for input(s): LIPASE, AMYLASE in the last 168 hours. No results for input(s): AMMONIA in the last 168 hours. CBC: Recent Labs  Lab 08/23/18 1237 08/23/18 1626 08/24/18 0822 08/25/18 0423  WBC 9.2 9.3 8.0 7.1  HGB 8.4* 7.9* 8.7* 8.0*  HCT 27.9* 25.6* 27.1* 24.5*  MCV 95.9 93.8 91.9 90.1  PLT 367 364 336 318   Cardiac Enzymes: No results for input(s): CKTOTAL, CKMB, CKMBINDEX, TROPONINI in the last 168 hours. CBG: No results for input(s): GLUCAP in the last 168 hours.  Studies/Results: Dg Chest Port 1 View  Result Date: 08/23/2018 CLINICAL DATA:  Dyspnea  EXAM: PORTABLE CHEST 1 VIEW COMPARISON:  July 11, 2018 FINDINGS: Again noted is borderline cardiomegaly. Both lungs are clear. The visualized skeletal structures are unremarkable. IMPRESSION: No active disease. Electronically Signed   By: Constance Holster M.D.   On: 08/23/2018 19:13   Medications: . sodium chloride    . sodium chloride 75 mL/hr at 08/25/18 1051  . dialysis solution 2.5% low-MG/low-CA     . feeding supplement (NEPRO CARB STEADY)  237 mL Oral TID WC  . gentamicin cream  1 application Topical Daily  . metoprolol tartrate  25 mg Oral QHS  . multivitamin  1 tablet Oral QHS  . pantoprazole  40 mg Oral BID  . potassium chloride  30 mEq Oral BID  . pravastatin  20 mg Oral QHS  . sucroferric oxyhydroxide  1,000 mg Oral TID AC  . venlafaxine XR  75 mg Oral QHS

## 2018-08-25 NOTE — Progress Notes (Signed)
Patient is asking to have a regular diet because she states that she can't eat the renal diet. Jonnie Finner, MD notified.

## 2018-08-25 NOTE — Progress Notes (Signed)
Subjective: Patient evaluated at bedside this morning. She is resting comfortably in bed on PD and does not appear to be in any acute distress. She ambulated yesterday and stated that she became dyspneic and dizzy, requiring rest. She also noted palpitations during this time.  Patient had a BM yesterday that was dark in color but no frank blood.   Objective:  Vital signs in last 24 hours: Vitals:   08/24/18 2253 08/24/18 2315 08/25/18 0529 08/25/18 0812  BP: 137/77 135/75 140/78 127/74  Pulse: 79 78 80 79  Resp: 17 18 18 16   Temp: 98.6 F (37 C) 98.6 F (37 C) 98.7 F (37.1 C) 97.7 F (36.5 C)  TempSrc: Oral Oral Oral Oral  SpO2: 99% 99% 100% 99%  Weight:      Height:       Physical Exam  Constitutional: She is oriented to person, place, and time and well-developed, well-nourished, and in no distress.  HENT:  Head: Normocephalic and atraumatic.  Cardiovascular: Normal rate, regular rhythm, normal heart sounds and intact distal pulses. Exam reveals no gallop and no friction rub.  No murmur heard. Pulmonary/Chest: Effort normal and breath sounds normal. No respiratory distress. She has no wheezes. She has no rales.  Abdominal: Soft. Bowel sounds are normal. She exhibits no distension and no mass. There is no abdominal tenderness. There is no guarding.  Musculoskeletal: Normal range of motion.        General: No tenderness, deformity or edema.  Neurological: She is alert and oriented to person, place, and time. No cranial nerve deficit.    Assessment/Plan:  Principal Problem:   Acute on chronic blood loss anemia Active Problems:   Morbid obesity ([place])   ESRD on peritoneal dialysis ([place])   Anemia due to chronic kidney disease   GI bleed  Patient is a 43yo female with PMHx of ESRD on PD, chronic anemia and small bowel AVMs presenting with dark stools, and symptomatic anemia. Patient transfused 1u pRBCs with improvement in Hb but continues to have dizziness and dyspnea on  exertion.   Acute on chronic anemia:  Patient with chronic anemia (Hb baseline 8-10) presented with dark stools, fatigue and dsypnea on exertion. Per pt, Hb 6.7 at outside lab. Initial Hb 7.9 here and pt received 1u pRBCs for symptomatic anemia. Hb 8.0 this AM. Iron studies 8/19: Fe 91, Ferritin 1320, TIBC 263, Saturation ratios 35%, consistent with ACD.  Patient continued to have dizziness and dyspnea on exertion yesterday s/p transfusion.   - Continue to monitor with CBC  - Orthostatic vitals  Hx of GI Bleeds:  Hx of small bowel AVMs s/p cauterization at [facility] in June 2020, friable mucosa in ascending colon, prior gastric ulcers, and diverticulosis. Pt presented with two days of dark stools. Patient discussed with GI, no further workup recommended at this point unless patient's anemia worsens or develops significant GI bleeding. Recommended for close GI follow up as outpatient.    - FOBT - Continue Pantoprazole 40mg  bid   ESRD on PD: Patient on peritoneal dialysis. Per nephrology, if no improvement in labs and symptoms, pt will need trial of hemodialysis. Pt does not want renal diet at this point, she is requesting regular diet.   - Continue strict I/O, daily weights - IV fluids per nephrology - Continue Velphora with PO diet    Hypokalemia:  Repleting orally with KDUR 61mEq bid. K 3.1 this AM  - Continue KDUR  Hypertension:  Pt on metoprolol 25mg  and lisinopril  $'20mg'T$  qd.  -Continue home meds   Hypercholesterolemia:  Pt on pravastatin $RemoveBefore'20mg'IIjJuTeFiCSxd$  qd. - Continue home meds  Depression/Anxiety:  Pt on xanax $Remove'1mg'fQndPJW$  bid for anxiety and venlafaxine $RemoveBeforeD'75mg'mcrGSENsdVAdJx$  qd.  - Continue home meds  Dispo: Anticipated discharge in approximately 1-2 day(s).   Harvie Heck, MD  Internal Medicine, PGY-1 08/25/2018, 10:06 AM Pager: $RemoveB'@336'OpbXGOPU$ [phone]

## 2018-08-26 DIAGNOSIS — Z886 Allergy status to analgesic agent status: Secondary | ICD-10-CM

## 2018-08-26 DIAGNOSIS — Z888 Allergy status to other drugs, medicaments and biological substances status: Secondary | ICD-10-CM

## 2018-08-26 LAB — CBC
HCT: 26.5 % — ABNORMAL LOW (ref 36.0–46.0)
Hemoglobin: 8.4 g/dL — ABNORMAL LOW (ref 12.0–15.0)
MCH: 28.8 pg (ref 26.0–34.0)
MCHC: 31.7 g/dL (ref 30.0–36.0)
MCV: 90.8 fL (ref 80.0–100.0)
Platelets: 297 10*3/uL (ref 150–400)
RBC: 2.92 MIL/uL — ABNORMAL LOW (ref 3.87–5.11)
RDW: 18.1 % — ABNORMAL HIGH (ref 11.5–15.5)
WBC: 6.7 10*3/uL (ref 4.0–10.5)
nRBC: 0.3 % — ABNORMAL HIGH (ref 0.0–0.2)

## 2018-08-26 LAB — RENAL FUNCTION PANEL
Albumin: 3.1 g/dL — ABNORMAL LOW (ref 3.5–5.0)
Anion gap: 17 — ABNORMAL HIGH (ref 5–15)
BUN: 46 mg/dL — ABNORMAL HIGH (ref 6–20)
CO2: 21 mmol/L — ABNORMAL LOW (ref 22–32)
Calcium: 8 mg/dL — ABNORMAL LOW (ref 8.9–10.3)
Chloride: 96 mmol/L — ABNORMAL LOW (ref 98–111)
Creatinine, Ser: 14.58 mg/dL — ABNORMAL HIGH (ref 0.44–1.00)
GFR calc Af Amer: 3 mL/min — ABNORMAL LOW (ref >60)
GFR calc non Af Amer: 3 mL/min — ABNORMAL LOW (ref >60)
Glucose, Bld: 102 mg/dL — ABNORMAL HIGH (ref 70–99)
Phosphorus: 5.5 mg/dL — ABNORMAL HIGH (ref 2.5–4.6)
Potassium: 3.1 mmol/L — ABNORMAL LOW (ref 3.5–5.1)
Sodium: 134 mmol/L — ABNORMAL LOW (ref 135–145)

## 2018-08-26 LAB — MAGNESIUM: Magnesium: 1.3 mg/dL — ABNORMAL LOW (ref 1.7–2.4)

## 2018-08-26 MED ORDER — MAGNESIUM SULFATE 50 % IJ SOLN
3.0000 g | Freq: Once | INTRAVENOUS | Status: AC
  Administered 2018-08-26: 3 g via INTRAVENOUS
  Filled 2018-08-26: qty 6

## 2018-08-26 MED ORDER — POTASSIUM CHLORIDE ER 10 MEQ PO TBCR: 10.0000 meq | EXTENDED_RELEASE_TABLET | Freq: Every day | ORAL | 0 refills | Status: DC

## 2018-08-26 MED ORDER — MAGNESIUM SULFATE 50 % IJ SOLN
3.0000 g | Freq: Once | INTRAMUSCULAR | Status: DC
  Filled 2018-08-26 (×2): qty 6

## 2018-08-26 MED ORDER — POTASSIUM CHLORIDE CRYS ER 10 MEQ PO TBCR: 10.0000 meq | EXTENDED_RELEASE_TABLET | Freq: Every day | ORAL | Status: AC

## 2018-08-26 MED ORDER — POTASSIUM CHLORIDE CRYS ER 15 MEQ PO TBCR: 15.0000 meq | EXTENDED_RELEASE_TABLET | Freq: Every day | ORAL | 0 refills | Status: DC

## 2018-08-26 MED FILL — POTASSIUM CHL ER M10 TABLET: 10 | 7 days supply | Qty: 7 | Fill #0

## 2018-08-26 NOTE — Progress Notes (Addendum)
Subjective: Patient seen and examined at bedside this morning. She is resting comfortably in bed. Does not appear to be in any acute distress. Patient reports her symptoms have improved. Her appetite is improving and she was able to ambulate and bathe yesterday without becoming dizzy, tachypnic or tachycardic.  Dr. Jonnie Finner discussed trial of HD with patient. However, she does not want to do HD at this time and would like to give PD more time. Patient to follow up with Dr. Archie Balboa as outpatient for further management of dialysis.   Objective:  Vital signs in last 24 hours: Vitals:   08/25/18 1800 08/25/18 2029 08/26/18 0425 08/26/18 0818  BP: 140/84 (!) 148/86 132/79 (!) 142/78  Pulse: 84 80 79 76  Resp: 16 18 16 20   Temp: 98.4 F (36.9 C) 98.6 F (37 C) 98.6 F (37 C) 98.2 F (36.8 C)  TempSrc: Oral Oral Oral Oral  SpO2: 98% 97% 98% 99%  Weight:  122.2 kg    Height:       Physical Exam  Constitutional: She is oriented to person, place, and time and well-developed, well-nourished, and in no distress.  Cardiovascular: Normal rate, regular rhythm, normal heart sounds and intact distal pulses. Exam reveals no gallop and no friction rub.  No murmur heard. Pulmonary/Chest: Effort normal and breath sounds normal. No respiratory distress. She has no wheezes. She has no rales. She exhibits no tenderness.  Abdominal: Soft. Bowel sounds are normal. She exhibits no distension. There is no abdominal tenderness. There is no guarding.  Musculoskeletal: Normal range of motion.        General: No edema.  Neurological: She is alert and oriented to person, place, and time. No cranial nerve deficit.  Skin: Skin is warm and dry.    Assessment/Plan:  Principal Problem:   Acute on chronic blood loss anemia Active Problems:   Morbid obesity ([place])   ESRD on peritoneal dialysis ([place])   Anemia due to chronic kidney disease   GI bleed  Patient is a 43yo female with PMHx of ESRD on PD, chronic  anemia and small bowel AVMs presenting with dark stools, and symptomatic anemia. Patient transfused 1u pRBCs with improvement in Hb and symptomatic improvement.   Acute on chronic anemia:  Patient with chronic anemia (Hb baseline 8-10) presented with dark stools, fatigue and dsypnea on exertion. Per pt, Hb 6.7 at outside lab. Initial Hb 7.9 here and pt received 1u pRBCs for symptomatic anemia. Iron studies 8/19: Fe 91, Ferritin 1320, TIBC 263, Saturation ratios 35%, consistent with ACD. Patient reports improvement in dizziness, dyspnea on exertion and palpitations. Hb 8.4 this AM. Orthostatic vitals wnl.   - F/u CBC with PCP  Hx of GI Bleeds:  Hx of small bowel AVMs s/p cauterization at [facility] in June 2020, friable mucosa in ascending colon, prior gastric ulcers, and diverticulosis. Pt presented with two days of dark stools. FOBT negative. Patient discussed with GI, no further workup recommended at this point unless patient's anemia worsens or develops significant GI bleeding. Recommended for close GI follow up as outpatient.    - Continue Pantoprazole 40mg  bid  ESRD on PD: Patient on peritoneal dialysis, candidate for kidney transplant. Nephrology recommendations for trial of HD given that patient's labs were not significantly improving and she was still symptomatic yesterday. However, patient wants to continue PD at this point and wants to hold off on HD as long as possible. Per Dr. Jimmy Footman, patient can continue on PD for next week with  monitoring of labs and symptoms. Patient will follow up with Dr. Archie Balboa for further management.   - Continue strict I/O, daily weights - Continue Velphora with PO diet   Hypokalemia:  Repleting orally with KDUR 82mEq bid. K 3.1 this AM. Mg 1.3. Will replete.   - Continue KDUR  Hypertension:  Pt on metoprolol $RemoveBefor'25mg'xrnipJYYLgvV$  and lisinopril $RemoveBefore'20mg'GZDEzzzkRlIfJ$  qd.  -Continue home meds   Hypercholesterolemia:  Pt on pravastatin $RemoveBefore'20mg'eOIlZTNovvtRf$  qd. - Continue home meds   Depression/Anxiety:  Pt on xanax $Remove'1mg'ItYqjNd$  bid for anxiety and venlafaxine $RemoveBeforeD'75mg'HLqKFHufuQZyQy$  qd.  - Continue home meds  Dispo: Anticipated discharge in approximately 0-1 day(s).   Harvie Heck, MD  Internal Medicine, PGY-1 08/26/2018, 9:59 AM Pager: [phone]

## 2018-08-26 NOTE — Discharge Summary (Addendum)
Name: Patricia [name] MRN: [id number] DOB: July 10, 1975 43 y.o. PCP: Elayne Guerin  Date of Admission: 08/23/2018 12:28 PM Date of Discharge: 08/26/2018 Attending Physician: Oda Kilts, MD  Discharge Diagnosis: 1. Acute on chronic anemia  2. ESRD on PD   Discharge Medications: Allergies as of 08/26/2018       Reactions   Nsaids Other (See Comments)   Cannot take due to Kidney disease/Kidney function   Tolmetin Other (See Comments)   Cannot take due to Kidney Disease        Medication List     STOP taking these medications    HYDROcodone-homatropine 5-1.5 MG/5ML syrup Commonly known as: HYCODAN   methocarbamol 500 MG tablet Commonly known as: ROBAXIN       TAKE these medications    allopurinol 100 MG tablet Commonly known as: ZYLOPRIM Take 100 mg by mouth at bedtime.   ALPRAZolam 1 MG tablet Commonly known as: XANAX Take 1 mg by mouth 2 (two) times daily as needed for anxiety.   calcitRIOL 0.5 MCG capsule Commonly known as: ROCALTROL Take 1 capsule (0.5 mcg total) by mouth daily with breakfast.   cephALEXin 500 MG capsule Commonly known as: KEFLEX Take 500 mg by mouth 2 (two) times daily.   FISH OIL PO Take 1 tablet by mouth daily.   gentamicin ointment 0.1 % Commonly known as: GARAMYCIN Apply 1 application topically daily as needed (changing dressing.).   hydrOXYzine 25 MG tablet Commonly known as: ATARAX/VISTARIL Take 25 mg by mouth every 6 (six) hours as needed for itching.   lisinopril 20 MG tablet Commonly known as: ZESTRIL Take 20 mg by mouth at bedtime.   meclizine 12.5 MG tablet Commonly known as: ANTIVERT Take 12.5 mg by mouth 2 (two) times daily as needed for dizziness.   metoprolol tartrate 25 MG tablet Commonly known as: LOPRESSOR Take 25 mg by mouth at bedtime.   multivitamin Tabs tablet Take 1 tablet by mouth at bedtime.   omeprazole 40 MG capsule Commonly known as: PRILOSEC Take 40 mg by mouth 2 (two) times  daily.   potassium chloride SA 15 MEQ tablet Commonly known as: KLOR-CON M15 Take 1 tablet (15 mEq total) by mouth daily for 5 days.   pravastatin 20 MG tablet Commonly known as: PRAVACHOL Take 20 mg by mouth at bedtime.   prochlorperazine 10 MG tablet Commonly known as: COMPAZINE Take 1 tablet (10 mg total) by mouth every 8 (eight) hours as needed for nausea or vomiting.   promethazine 25 MG tablet Commonly known as: PHENERGAN Take 1 tablet (25 mg total) by mouth every 8 (eight) hours as needed for nausea or vomiting.   Velphoro 500 MG chewable tablet Generic drug: sucroferric oxyhydroxide Chew 1,000 mg by mouth 3 (three) times daily before meals.   venlafaxine XR 75 MG 24 hr capsule Commonly known as: EFFEXOR-XR Take 75 mg by mouth at bedtime.        Disposition and follow-up:   [name] was discharged from [facility] in Stable condition.  At the hospital follow up visit please address:  1.  Acute on chronic anemia: Pt presented with symptomatic anemia. She was transfused 1u pRBC in hospital. Please follow up H/H on CBC.   ESRD on PD: Pt with worsening renal function on PD. Recommended for trial of HD but pt declined. Will follow up with Dr. Jimmy Footman in 1 week.   Hx of GI Bleed:  Pt presented with dark stools. FOBT negative.  Given hx of GI bleeds, will follow closely with GI.   2.  Labs / imaging needed at time of follow-up: CBC, Renal function panel   3.  Pending labs/ test needing follow-up: None  Follow-up Appointments: Follow-up Information     Long, Caryl Pina, [place]. Schedule an appointment as soon as possible for a visit in 1 week(s).   Specialty: Physician Assistant Contact information: [address] [place] [address] [phone]         Mauricia Area, MD. Schedule an appointment as soon as possible for a visit in 1 week(s).   Specialty: Nephrology Contact information: [place] [address]  [phone]         Mansouraty, Telford Nab., MD. Schedule an appointment as soon as possible for a visit in 1 week(s).   Specialties: Gastroenterology, Internal Medicine Contact information: [place] [address] [phone]            Hospital Course by problem list:  Patient is a 43yo female with PMHx of ESRD on PD, chronic anemia and small bowel AVMs presenting with dark stools, and symptomatic anemia. Patient transfused 1u pRBCs with improvement in Hb and symptomatic improvement.    Acute on chronic anemia:  Patient with chronic anemia (Hb baseline 8-10) presented with dark stools, fatigue and dsypnea and chest pain on exertion. Per pt, Hb 6.7 at outside lab. Initial Hb 7.9 here and pt received 1u pRBCs for symptomatic anemia. Iron studies 8/19: Fe 91, Ferritin 1320, TIBC 263, Saturation ratios 35%, consistent with ACD. Patient reports improvement in dizziness, dyspnea on exertion and palpitations. Hb 8.4 this AM. Orthostatic vitals wnl.  - F/u CBC    Hx of GI Bleeds:  Hx of small bowel AVMs s/p cauterization at [facility] in June 2020, friable mucosa in ascending colon, prior gastric ulcers, and diverticulosis. Pt presented with two days of dark stools. FOBT negative. Patient discussed with GI, no further workup recommended at this point unless patient's anemia worsens or develops significant GI bleeding. Recommended for close GI follow up as outpatient.      ESRD on PD: Patient on peritoneal dialysis, candidate for kidney transplant. BUN/Cr 54/16.74 on admission. Patient continued on peritoneal dialysis in hospital. BUN/Cr 46/14.58 on day of discharge. Nephrology recommendations for trial of HD given that patient's labs not significantly improving and she was still symptomatic. However, patient wants to continue PD at this point and wants to hold off on HD as long as possible. Per Dr. Jimmy Footman, patient can continue on PD for next week with monitoring of labs and  symptoms. Patient will follow up with Dr. Jimmy Footman for further management of dialysis.  - Continue home PD - Continue strict I/O, daily weights - Continue Velphora with PO diet    Hypokalemia:  Repleting orally with KDUR 45mEq bid. K 3.1 this AM. Mg 1.3. Repleted. Patient discharged with 12mEq/day of KDUR until she is seen by Nephrology or PCP.    Hypertension:  Continue metoprolol 25mg  and lisinopril 20mg  qd.    Hypercholesterolemia:  Continue pravastatin 20mg  qd.   Depression/Anxiety:  Continue xanax 1mg  bid for anxiety and venlafaxine 75mg  qd.   Discharge Vitals:   BP (!) 155/81 (BP Location: Right Wrist)   Pulse 75   Temp 98.2 F (36.8 C) (Axillary)   Resp 18   Ht 5\' 7"  (1.702 m)   Wt 122.2 kg   SpO2 98%   BMI 42.19 kg/m   Pertinent Labs, Studies, and Procedures:  FOBT negative  Hb 7.9 (admission), 8.0 s/p 1u pRBC transfusion, 8.4 this AM.  Iron studies: Fe 91, TIBC 263, Saturation ratio 35%, Ferritin 1320   BUN/Cr trending:  54/16.74 >> 55/16.14 >> 46/14.58  Discharge Instructions: Ms. Dorthie, Santini were admitted to the hospital for symptomatic anemia. You were given 1u pRBC which resulted in improvement of your symptoms.  Your peritoneal dialysis was continued in the hospital. Given the your symptoms and labs, you were recommended for trial of HD. At this time, please continue your nightly peritoneal dialysis at home. Please follow up with Dr. Jimmy Footman next week for further management and discussion of continuing peritoneal dialysis or starting hemodialysis.   Please schedule an appointment to see your PCP and follow up on your hemoglobin levels.  Please schedule an appointment with your GI doctor for hospital follow up.    Please contact us if you have any questions.   Thank you!   Discharge Instructions     Call MD for:  extreme fatigue   Complete by: As directed    Call MD for:  persistant dizziness or light-headedness   Complete by: As directed     Call MD for:  persistant nausea and vomiting   Complete by: As directed    Call MD for:  temperature >100.4   Complete by: As directed    Diet - low sodium heart healthy   Complete by: As directed    Increase activity slowly   Complete by: As directed        Signed: Harvie Heck, MD  Internal Medicine, PGY-1  08/26/2018, 3:45 PM   Pager: [phone]

## 2018-08-26 NOTE — Plan of Care (Signed)
DISCHARGE NOTE HOME Lucille Schoon to be discharged home per MD order. Discussed prescriptions and follow up appointments with the patient. Prescriptions given to patient; medication list explained in detail. Patient verbalized understanding.  Skin clean, dry and intact without evidence of skin break down, no evidence of skin tears noted. IV catheter discontinued intact. Site without signs and symptoms of complications. Dressing and pressure applied. Pt denies pain at the site currently. No complaints noted.  Patient free of lines, drains, and wounds.   An After Visit Summary (AVS) was printed and given to the patient. Patient escorted via wheelchair, and discharged home via private auto.  Stephan Minister, RN

## 2018-08-26 NOTE — Progress Notes (Addendum)
[facility] Progress Note   Subjective:   Patient seen and examined at bedside.  Reports improvement in nausea, SOB, dizziness and weakness.  Discussed concern for inadequate dialysis with PD and a trial of HD to see if clearance improved.  Patient resistant to this idea and wants to try PD for a little longer.  Discussed with primary OP nephrologist, Dr. Jimmy Footman, who agreed to let her continue PD for the next week and see if symptoms and labs improve.    Objective Vitals:   08/25/18 2029 08/26/18 0425 08/26/18 0818 08/26/18 1208  BP: (!) 148/86 132/79 (!) 142/78 (!) 155/81  Pulse: 80 79 76 75  Resp: $Remo'18 16 20 18  'QxHCH$ Temp: 98.6 F (37 C) 98.6 F (37 C) 98.2 F (36.8 C) 98.2 F (36.8 C)  TempSrc: Oral Oral Oral Axillary  SpO2: 97% 98% 99% 98%  Weight: 122.2 kg     Height:       Physical Exam General:NAD, pleasant, obese female, laying in bed Heart:RRR Lungs:CTAB Abdomen:soft, NTND,  Extremities:no LE edema  Dialysis Access: PD cath  [facility] Weights   08/25/18 1410 08/25/18 1750 08/25/18 2029  Weight: 122.7 kg 123.6 kg 122.2 kg    Intake/Output Summary (Last 24 hours) at 08/26/2018 1224 Last data filed at 08/26/2018 1217 Gross per 24 hour  Intake 17401.25 ml  Output 15393 ml  Net 2008.25 ml    Additional Objective Labs: Basic Metabolic Panel: Recent Labs  Lab 08/24/18 0822 08/25/18 0423 08/26/18 0715  NA 138 135 134*  K 2.9* 3.1* 3.1*  CL 98 96* 96*  CO2 20* 21* 21*  GLUCOSE 142* 102* 102*  BUN 61* 55* 46*  CREATININE 17.65* 16.14* 14.58*  CALCIUM 7.8* 8.0* 8.0*  PHOS 5.9* 5.9* 5.5*   Liver Function Tests: Recent Labs  Lab 08/23/18 1237 08/24/18 0822 08/25/18 0423 08/26/18 0715  AST 30  --   --   --   ALT 20  --   --   --   ALKPHOS 55  --   --   --   BILITOT 0.9  --   --   --   PROT 7.6  --   --   --   ALBUMIN 3.7 3.2* 3.1* 3.1*   CBC: Recent Labs  Lab 08/23/18 1237 08/23/18 1626 08/24/18 0822 08/25/18 0423 08/26/18 0715  WBC  9.2 9.3 8.0 7.1 6.7  HGB 8.4* 7.9* 8.7* 8.0* 8.4*  HCT 27.9* 25.6* 27.1* 24.5* 26.5*  MCV 95.9 93.8 91.9 90.1 90.8  PLT 367 364 336 318 297   Blood Culture    Component Value Date/Time   SDES BLOOD RIGHT HAND 08/23/2018 2120   SPECREQUEST  08/23/2018 2120    BOTTLES DRAWN AEROBIC ONLY Blood Culture adequate volume   CULT  08/23/2018 2120    NO GROWTH 3 DAYS Performed at [facility], [place] [address], [place],  [address]    REPTSTATUS PENDING 08/23/2018 2120   Iron Studies:  Recent Labs    08/23/18 2115  IRON 91  TIBC 263  FERRITIN 1,320*   Lab Results  Component Value Date   INR 1.07 02/26/2018   INR 0.98 12/22/2017   INR 1.04 04/14/2016   Studies/Results: No results found.  Medications: . sodium chloride    . sodium chloride 75 mL/hr at 08/26/18 0501  . dialysis solution 2.5% low-MG/low-CA     . feeding supplement (NEPRO CARB STEADY)  237 mL Oral TID WC  . gentamicin cream  1 application Topical Daily  . metoprolol tartrate  25 mg Oral QHS  . multivitamin  1 tablet Oral QHS  . pantoprazole  40 mg Oral BID  . potassium chloride  30 mEq Oral BID  . pravastatin  20 mg Oral QHS  . sucroferric oxyhydroxide  1,000 mg Oral TID AC  . venlafaxine XR  75 mg Oral QHS    Assessment/Plan: 1. ESRD w/Uremia Sx - nausea/weakness, some improvement in Sx.  Dr. Jonnie Finner  d/w Dr. Jimmy Footman & pt who plan to monitor Sx/labs on PD for another week prior to trial of HD.  SCr improved to 14.    2. Hypokalemia - K 3.1, getting PO K supplements.  Liberalized diet.  2. AoC Anemia - Hgb 8.0 s/p 1unit pRBC.  Given mircera 263mcg on 08/17/2018.  3. GIB: Hx small bowel AVMs s/p cauterization in 06/2018.  FOBT negative.  Per GI no additional wkup needed at this time. Need close GI f/u as OP.      4. Secondary hyperparathyroidism - Ca at goal. Phos elevated.  Continue binders. 5. HTN/volume - BP a little elevated. Given IV fluids b/c appeared dry initially. Using 1.5% bags for PD.  Continue home meds.  6. DIspo - OK for dc today   Jen Mow, PA-C [facility] Pager: [phone] 08/26/2018,12:24 PM  LOS: 3 days   Pt seen, examined and agree w A/P as above.  Kelly Splinter  MD 08/26/2018, 3:48 PM

## 2018-08-26 NOTE — Discharge Instructions (Signed)
[name], [place] were admitted to the hospital for symptomatic anemia. You were given 1u pRBC which resulted in improvement of your symptoms.  Your peritoneal dialysis was continued in the hospital. Given the your symptoms and labs, you were recommended for trial of HD. At this time, please continue your nightly peritoneal dialysis at home. Please follow up with Dr. Jimmy Footman next week for further management and discussion of continuing peritoneal dialysis or starting hemodialysis.  Please schedule an appointment to see your PCP and follow up on your hemoglobin levels.  Please schedule an appointment with your GI doctor for hospital follow up.   Please contact us if you have any questions.  Thank you!

## 2018-08-26 NOTE — Care Management Important Message (Signed)
Important Message  Patient Details  Name: Patricia [name] MRN: [id number] Date of Birth: [birth year]   Medicare Important Message Given:  Yes     Orbie Pyo 08/26/2018, 4:25 PM

## 2018-08-26 NOTE — Progress Notes (Signed)
  Date: 08/26/2018  [name]  Medical record number: [id number]  Date of birth: September 28, 1975   I have seen and evaluated this patient and I have discussed the plan of care with the house staff. Please see their note for complete details. I concur with their findings with the following additions/corrections:   Resistant to plan of HD to see if symptoms and labs improve. Discussed with Dr. Jonnie Finner, also with her nephrologist Dr. Jimmy Footman, they are agreeable, plan to follow up next week. Ok to discharge, Hgb is stable without further transfusion. No tachycardia, palpitations, or dizziness with standing today.   Lenice Pressman, M.D., Ph.D. 08/26/2018, 5:33 PM

## 2018-08-27 LAB — CULTURE, BLOOD (ROUTINE X 2): Special Requests: ADEQUATE

## 2018-08-28 LAB — CULTURE, BLOOD (ROUTINE X 2)
Culture: NO GROWTH
Special Requests: ADEQUATE

## 2018-08-29 NOTE — Care Management Important Message (Signed)
Important Message  Patient Details  Name: Patricia [name] MRN: [id number] Date of Birth: [birth year]   Medicare Important Message Given:  Yes     [name] 08/29/2018, 9:22 AM

## 2019-02-17 ENCOUNTER — Other Ambulatory Visit
Admission: RE | Admit: 2019-02-17 | Discharge: 2019-02-17 | Disposition: A | Discharge status: Home / Self Care | Payer: Medicare Other | Source: Ambulatory Visit | Attending: Nephrology | Admitting: Nephrology

## 2019-02-17 DIAGNOSIS — K650 Generalized (acute) peritonitis: Secondary | ICD-10-CM | POA: Insufficient documentation

## 2019-02-17 LAB — VANCOMYCIN, TROUGH: Vancomycin Tr: 34 ug/mL (ref 15–20)

## 2019-04-06 DIAGNOSIS — D649 Anemia, unspecified: Secondary | ICD-10-CM

## 2019-04-06 HISTORY — DX: Anemia, unspecified: D64.9

## 2019-04-17 DIAGNOSIS — A499 Bacterial infection, unspecified: Secondary | ICD-10-CM | POA: Insufficient documentation

## 2019-04-17 DIAGNOSIS — R52 Pain, unspecified: Secondary | ICD-10-CM | POA: Insufficient documentation

## 2019-04-17 DIAGNOSIS — L299 Pruritus, unspecified: Secondary | ICD-10-CM | POA: Insufficient documentation

## 2019-04-24 ENCOUNTER — Inpatient Hospital Stay (HOSPITAL_COMMUNITY)
Admission: EM | Admit: 2019-04-24 | Discharge: 2019-04-28 | DRG: 682 | Disposition: A | Discharge status: Home / Self Care | Payer: Medicare Other | Attending: Family Medicine | Admitting: Family Medicine

## 2019-04-24 DIAGNOSIS — R0789 Other chest pain: Secondary | ICD-10-CM | POA: Diagnosis not present

## 2019-04-24 DIAGNOSIS — I471 Supraventricular tachycardia, unspecified: Secondary | ICD-10-CM

## 2019-04-24 DIAGNOSIS — D649 Anemia, unspecified: Secondary | ICD-10-CM | POA: Diagnosis present

## 2019-04-24 DIAGNOSIS — E7800 Pure hypercholesterolemia, unspecified: Secondary | ICD-10-CM | POA: Diagnosis present

## 2019-04-24 DIAGNOSIS — Z90710 Acquired absence of both cervix and uterus: Secondary | ICD-10-CM

## 2019-04-24 DIAGNOSIS — E8889 Other specified metabolic disorders: Secondary | ICD-10-CM | POA: Diagnosis present

## 2019-04-24 DIAGNOSIS — N186 End stage renal disease: Secondary | ICD-10-CM

## 2019-04-24 DIAGNOSIS — Z8673 Personal history of transient ischemic attack (TIA), and cerebral infarction without residual deficits: Secondary | ICD-10-CM

## 2019-04-24 DIAGNOSIS — R778 Other specified abnormalities of plasma proteins: Secondary | ICD-10-CM

## 2019-04-24 DIAGNOSIS — M109 Gout, unspecified: Secondary | ICD-10-CM | POA: Diagnosis present

## 2019-04-24 DIAGNOSIS — I120 Hypertensive chronic kidney disease with stage 5 chronic kidney disease or end stage renal disease: Principal | ICD-10-CM | POA: Diagnosis present

## 2019-04-24 DIAGNOSIS — I160 Hypertensive urgency: Secondary | ICD-10-CM | POA: Diagnosis present

## 2019-04-24 DIAGNOSIS — E785 Hyperlipidemia, unspecified: Secondary | ICD-10-CM | POA: Diagnosis present

## 2019-04-24 DIAGNOSIS — Z9221 Personal history of antineoplastic chemotherapy: Secondary | ICD-10-CM

## 2019-04-24 DIAGNOSIS — I248 Other forms of acute ischemic heart disease: Secondary | ICD-10-CM | POA: Diagnosis present

## 2019-04-24 DIAGNOSIS — Z992 Dependence on renal dialysis: Secondary | ICD-10-CM

## 2019-04-24 DIAGNOSIS — R0600 Dyspnea, unspecified: Secondary | ICD-10-CM

## 2019-04-24 DIAGNOSIS — K219 Gastro-esophageal reflux disease without esophagitis: Secondary | ICD-10-CM | POA: Diagnosis present

## 2019-04-24 DIAGNOSIS — E669 Obesity, unspecified: Secondary | ICD-10-CM | POA: Diagnosis present

## 2019-04-24 DIAGNOSIS — D6489 Other specified anemias: Secondary | ICD-10-CM | POA: Diagnosis present

## 2019-04-24 DIAGNOSIS — Z20822 Contact with and (suspected) exposure to covid-19: Secondary | ICD-10-CM | POA: Diagnosis present

## 2019-04-24 DIAGNOSIS — K922 Gastrointestinal hemorrhage, unspecified: Secondary | ICD-10-CM | POA: Diagnosis present

## 2019-04-24 DIAGNOSIS — N2581 Secondary hyperparathyroidism of renal origin: Secondary | ICD-10-CM | POA: Diagnosis present

## 2019-04-24 DIAGNOSIS — D631 Anemia in chronic kidney disease: Secondary | ICD-10-CM | POA: Diagnosis present

## 2019-04-24 DIAGNOSIS — R7989 Other specified abnormal findings of blood chemistry: Secondary | ICD-10-CM

## 2019-04-24 DIAGNOSIS — Z6841 Body Mass Index (BMI) 40.0 and over, adult: Secondary | ICD-10-CM

## 2019-04-24 MED ORDER — SODIUM CHLORIDE 0.9% FLUSH: 3.0000 mL | Freq: Once | INTRAVENOUS | Status: DC

## 2019-04-24 NOTE — ED Triage Notes (Signed)
Pt transported from home by [facility] for reports of SVT, hx same. Pt given Adenosine $RemoveBeforeD'6mg'JqsgIEncIQxagT$  IVP, converted from 182bpm to 90bpm, heaviness in chest at this time.

## 2019-04-25 ENCOUNTER — Encounter (HOSPITAL_COMMUNITY): Payer: Self-pay | Admitting: Emergency Medicine

## 2019-04-25 ENCOUNTER — Inpatient Hospital Stay (HOSPITAL_COMMUNITY): Payer: Medicare Other

## 2019-04-25 ENCOUNTER — Emergency Department (HOSPITAL_COMMUNITY): Payer: Medicare Other

## 2019-04-25 DIAGNOSIS — I471 Supraventricular tachycardia: Secondary | ICD-10-CM | POA: Diagnosis present

## 2019-04-25 DIAGNOSIS — Z20822 Contact with and (suspected) exposure to covid-19: Secondary | ICD-10-CM | POA: Diagnosis present

## 2019-04-25 DIAGNOSIS — Z6841 Body Mass Index (BMI) 40.0 and over, adult: Secondary | ICD-10-CM | POA: Diagnosis not present

## 2019-04-25 DIAGNOSIS — I10 Essential (primary) hypertension: Secondary | ICD-10-CM

## 2019-04-25 DIAGNOSIS — Z992 Dependence on renal dialysis: Secondary | ICD-10-CM

## 2019-04-25 DIAGNOSIS — E7800 Pure hypercholesterolemia, unspecified: Secondary | ICD-10-CM | POA: Diagnosis present

## 2019-04-25 DIAGNOSIS — N186 End stage renal disease: Secondary | ICD-10-CM

## 2019-04-25 DIAGNOSIS — I248 Other forms of acute ischemic heart disease: Secondary | ICD-10-CM | POA: Diagnosis present

## 2019-04-25 DIAGNOSIS — Z8673 Personal history of transient ischemic attack (TIA), and cerebral infarction without residual deficits: Secondary | ICD-10-CM | POA: Diagnosis not present

## 2019-04-25 DIAGNOSIS — R079 Chest pain, unspecified: Secondary | ICD-10-CM | POA: Diagnosis not present

## 2019-04-25 DIAGNOSIS — D649 Anemia, unspecified: Secondary | ICD-10-CM | POA: Diagnosis not present

## 2019-04-25 DIAGNOSIS — D631 Anemia in chronic kidney disease: Secondary | ICD-10-CM | POA: Diagnosis present

## 2019-04-25 DIAGNOSIS — N2581 Secondary hyperparathyroidism of renal origin: Secondary | ICD-10-CM | POA: Diagnosis present

## 2019-04-25 DIAGNOSIS — Z90710 Acquired absence of both cervix and uterus: Secondary | ICD-10-CM | POA: Diagnosis not present

## 2019-04-25 DIAGNOSIS — E669 Obesity, unspecified: Secondary | ICD-10-CM | POA: Diagnosis present

## 2019-04-25 DIAGNOSIS — M109 Gout, unspecified: Secondary | ICD-10-CM | POA: Diagnosis present

## 2019-04-25 DIAGNOSIS — D6489 Other specified anemias: Secondary | ICD-10-CM | POA: Diagnosis present

## 2019-04-25 DIAGNOSIS — R0789 Other chest pain: Secondary | ICD-10-CM | POA: Diagnosis present

## 2019-04-25 DIAGNOSIS — E8889 Other specified metabolic disorders: Secondary | ICD-10-CM | POA: Diagnosis present

## 2019-04-25 DIAGNOSIS — I120 Hypertensive chronic kidney disease with stage 5 chronic kidney disease or end stage renal disease: Secondary | ICD-10-CM | POA: Diagnosis present

## 2019-04-25 DIAGNOSIS — I160 Hypertensive urgency: Secondary | ICD-10-CM | POA: Diagnosis present

## 2019-04-25 DIAGNOSIS — R778 Other specified abnormalities of plasma proteins: Secondary | ICD-10-CM

## 2019-04-25 DIAGNOSIS — E785 Hyperlipidemia, unspecified: Secondary | ICD-10-CM | POA: Diagnosis present

## 2019-04-25 DIAGNOSIS — Z9221 Personal history of antineoplastic chemotherapy: Secondary | ICD-10-CM | POA: Diagnosis not present

## 2019-04-25 DIAGNOSIS — R0602 Shortness of breath: Secondary | ICD-10-CM | POA: Diagnosis not present

## 2019-04-25 DIAGNOSIS — K219 Gastro-esophageal reflux disease without esophagitis: Secondary | ICD-10-CM | POA: Diagnosis present

## 2019-04-25 LAB — BASIC METABOLIC PANEL
Anion gap: 8 (ref 5–15)
Anion gap: 18 — ABNORMAL HIGH (ref 5–15)
BUN: 26 mg/dL — ABNORMAL HIGH (ref 6–20)
BUN: 35 mg/dL — ABNORMAL HIGH (ref 6–20)
CO2: 33 mmol/L — ABNORMAL HIGH (ref 22–32)
CO2: 23 mmol/L (ref 22–32)
Calcium: 8.4 mg/dL — ABNORMAL LOW (ref 8.9–10.3)
Calcium: 9.3 mg/dL (ref 8.9–10.3)
Chloride: 101 mmol/L (ref 98–111)
Chloride: 100 mmol/L (ref 98–111)
Creatinine, Ser: 8.87 mg/dL — ABNORMAL HIGH (ref 0.44–1.00)
Creatinine, Ser: 10.47 mg/dL — ABNORMAL HIGH (ref 0.44–1.00)
GFR calc Af Amer: 6 mL/min — ABNORMAL LOW (ref >60)
GFR calc Af Amer: 5 mL/min — ABNORMAL LOW (ref >60)
GFR calc non Af Amer: 5 mL/min — ABNORMAL LOW (ref >60)
GFR calc non Af Amer: 4 mL/min — ABNORMAL LOW (ref >60)
Glucose, Bld: 130 mg/dL — ABNORMAL HIGH (ref 70–99)
Glucose, Bld: 103 mg/dL — ABNORMAL HIGH (ref 70–99)
Potassium: 3.9 mmol/L (ref 3.5–5.1)
Potassium: 5.3 mmol/L — ABNORMAL HIGH (ref 3.5–5.1)
Sodium: 142 mmol/L (ref 135–145)
Sodium: 141 mmol/L (ref 135–145)

## 2019-04-25 LAB — CBC
HCT: 22.2 % — ABNORMAL LOW (ref 36.0–46.0)
HCT: 26.1 % — ABNORMAL LOW (ref 36.0–46.0)
Hemoglobin: 6.6 g/dL — CL (ref 12.0–15.0)
Hemoglobin: 8.5 g/dL — ABNORMAL LOW (ref 12.0–15.0)
MCH: 27.6 pg (ref 26.0–34.0)
MCH: 28.6 pg (ref 26.0–34.0)
MCHC: 29.7 g/dL — ABNORMAL LOW (ref 30.0–36.0)
MCHC: 32.6 g/dL (ref 30.0–36.0)
MCV: 92.9 fL (ref 80.0–100.0)
MCV: 87.9 fL (ref 80.0–100.0)
Platelets: 232 10*3/uL (ref 150–400)
Platelets: 227 10*3/uL (ref 150–400)
RBC: 2.39 MIL/uL — ABNORMAL LOW (ref 3.87–5.11)
RBC: 2.97 MIL/uL — ABNORMAL LOW (ref 3.87–5.11)
RDW: 14.3 % (ref 11.5–15.5)
RDW: 14.1 % (ref 11.5–15.5)
WBC: 7 10*3/uL (ref 4.0–10.5)
WBC: 8.8 10*3/uL (ref 4.0–10.5)
nRBC: 0 % (ref 0.0–0.2)
nRBC: 0 % (ref 0.0–0.2)

## 2019-04-25 LAB — MAGNESIUM
Magnesium: 1.5 mg/dL — ABNORMAL LOW (ref 1.7–2.4)
Magnesium: 2.1 mg/dL (ref 1.7–2.4)

## 2019-04-25 LAB — TROPONIN I (HIGH SENSITIVITY)
Troponin I (High Sensitivity): 36 ng/L — ABNORMAL HIGH (ref <18)
Troponin I (High Sensitivity): 90 ng/L — ABNORMAL HIGH (ref <18)

## 2019-04-25 LAB — IRON AND TIBC
Iron: 66 ug/dL (ref 28–170)
Saturation Ratios: 30 % (ref 10.4–31.8)
TIBC: 223 ug/dL — ABNORMAL LOW (ref 250–450)
UIBC: 157 ug/dL

## 2019-04-25 LAB — TSH: TSH: 1.702 u[IU]/mL (ref 0.350–4.500)

## 2019-04-25 LAB — RENAL FUNCTION PANEL
Albumin: 2.7 g/dL — ABNORMAL LOW (ref 3.5–5.0)
Anion gap: 13 (ref 5–15)
BUN: 35 mg/dL — ABNORMAL HIGH (ref 6–20)
CO2: 27 mmol/L (ref 22–32)
Calcium: 9.2 mg/dL (ref 8.9–10.3)
Chloride: 101 mmol/L (ref 98–111)
Creatinine, Ser: 10.49 mg/dL — ABNORMAL HIGH (ref 0.44–1.00)
GFR calc Af Amer: 5 mL/min — ABNORMAL LOW (ref >60)
GFR calc non Af Amer: 4 mL/min — ABNORMAL LOW (ref >60)
Glucose, Bld: 102 mg/dL — ABNORMAL HIGH (ref 70–99)
Phosphorus: 5.8 mg/dL — ABNORMAL HIGH (ref 2.5–4.6)
Potassium: 5.4 mmol/L — ABNORMAL HIGH (ref 3.5–5.1)
Sodium: 141 mmol/L (ref 135–145)

## 2019-04-25 LAB — RETICULOCYTES
Immature Retic Fract: 13.5 % (ref 2.3–15.9)
RBC.: 2.78 MIL/uL — ABNORMAL LOW (ref 3.87–5.11)
Retic Count, Absolute: 18.9 10*3/uL — ABNORMAL LOW (ref 19.0–186.0)
Retic Ct Pct: 0.7 % (ref 0.4–3.1)

## 2019-04-25 LAB — PHOSPHORUS: Phosphorus: 4.6 mg/dL (ref 2.5–4.6)

## 2019-04-25 LAB — VITAMIN B12: Vitamin B-12: 1416 pg/mL — ABNORMAL HIGH (ref 180–914)

## 2019-04-25 LAB — POC OCCULT BLOOD, ED: Fecal Occult Bld: NEGATIVE

## 2019-04-25 LAB — SARS CORONAVIRUS 2 (TAT 6-24 HRS): SARS Coronavirus 2: NEGATIVE

## 2019-04-25 LAB — FERRITIN: Ferritin: 1151 ng/mL — ABNORMAL HIGH (ref 11–307)

## 2019-04-25 LAB — FOLATE: Folate: 48.6 ng/mL (ref >5.9)

## 2019-04-25 LAB — HEMOGLOBIN AND HEMATOCRIT, BLOOD
HCT: 25.4 % — ABNORMAL LOW (ref 36.0–46.0)
Hemoglobin: 7.8 g/dL — ABNORMAL LOW (ref 12.0–15.0)

## 2019-04-25 LAB — PREPARE RBC (CROSSMATCH)

## 2019-04-25 MED ORDER — HEPARIN SODIUM (PORCINE) 5000 UNIT/ML IJ SOLN
5000.0000 [IU] | Freq: Three times a day (TID) | INTRAMUSCULAR | Status: DC
  Administered 2019-04-25 – 2019-04-28 (×8): 5000 [IU] via SUBCUTANEOUS
  Filled 2019-04-25 (×9): qty 1

## 2019-04-25 MED ORDER — SODIUM CHLORIDE 0.9 % IV SOLN: 10.0000 mL/h | Freq: Once | INTRAVENOUS | Status: DC

## 2019-04-25 MED ORDER — MAGNESIUM SULFATE 2 GM/50ML IV SOLN
2.0000 g | Freq: Once | INTRAVENOUS | Status: AC
  Administered 2019-04-25: 03:00:00 2 g via INTRAVENOUS
  Filled 2019-04-25: qty 50

## 2019-04-25 MED ORDER — METOPROLOL TARTRATE 25 MG PO TABS: 25.0000 mg | ORAL_TABLET | Freq: Every day | ORAL | Status: DC

## 2019-04-25 MED ORDER — METOPROLOL TARTRATE 50 MG PO TABS
50.0000 mg | ORAL_TABLET | Freq: Two times a day (BID) | ORAL | Status: DC
  Administered 2019-04-25 – 2019-04-28 (×7): 50 mg via ORAL
  Filled 2019-04-25 (×6): qty 1
  Filled 2019-04-25: qty 2

## 2019-04-25 MED ORDER — PANTOPRAZOLE SODIUM 40 MG IV SOLR
40.0000 mg | Freq: Two times a day (BID) | INTRAVENOUS | Status: DC
  Administered 2019-04-25 – 2019-04-27 (×5): 40 mg via INTRAVENOUS
  Filled 2019-04-25 (×6): qty 40

## 2019-04-25 MED ORDER — FERRIC CITRATE 1 GM 210 MG(FE) PO TABS
630.0000 mg | ORAL_TABLET | Freq: Three times a day (TID) | ORAL | Status: DC
  Administered 2019-04-25 – 2019-04-28 (×6): 630 mg via ORAL
  Filled 2019-04-25 (×10): qty 3

## 2019-04-25 MED ORDER — ALLOPURINOL 100 MG PO TABS
100.0000 mg | ORAL_TABLET | Freq: Every day | ORAL | Status: DC
  Administered 2019-04-25 – 2019-04-27 (×3): 100 mg via ORAL
  Filled 2019-04-25 (×4): qty 1

## 2019-04-25 MED ORDER — ALPRAZOLAM 0.5 MG PO TABS
1.0000 mg | ORAL_TABLET | Freq: Two times a day (BID) | ORAL | Status: DC | PRN
  Administered 2019-04-25 – 2019-04-27 (×3): 1 mg via ORAL
  Filled 2019-04-25: qty 4
  Filled 2019-04-25 (×2): qty 2

## 2019-04-25 MED ORDER — RENA-VITE PO TABS
1.0000 | ORAL_TABLET | Freq: Every day | ORAL | Status: DC
  Administered 2019-04-25 – 2019-04-27 (×3): 1 via ORAL
  Filled 2019-04-25 (×4): qty 1

## 2019-04-25 MED ORDER — PANTOPRAZOLE SODIUM 40 MG PO TBEC: 40.0000 mg | DELAYED_RELEASE_TABLET | Freq: Two times a day (BID) | ORAL | Status: DC

## 2019-04-25 MED ORDER — CALCITRIOL 0.5 MCG PO CAPS
0.5000 ug | ORAL_CAPSULE | Freq: Every day | ORAL | Status: DC
  Administered 2019-04-25 – 2019-04-27 (×3): 0.5 ug via ORAL
  Filled 2019-04-25 (×5): qty 1

## 2019-04-25 MED ORDER — CHLORHEXIDINE GLUCONATE CLOTH 2 % EX PADS
6.0000 | MEDICATED_PAD | Freq: Every day | CUTANEOUS | Status: DC
  Administered 2019-04-26 – 2019-04-28 (×3): 6 via TOPICAL

## 2019-04-25 MED ORDER — DARBEPOETIN ALFA 100 MCG/0.5ML IJ SOSY
100.0000 ug | PREFILLED_SYRINGE | INTRAMUSCULAR | Status: DC
  Filled 2019-04-25: qty 0.5

## 2019-04-25 MED ORDER — ACETAMINOPHEN 325 MG PO TABS
650.0000 mg | ORAL_TABLET | Freq: Four times a day (QID) | ORAL | Status: DC | PRN
  Administered 2019-04-25 – 2019-04-27 (×5): 650 mg via ORAL
  Filled 2019-04-25 (×5): qty 2

## 2019-04-25 MED ORDER — ACETAMINOPHEN 650 MG RE SUPP: 650.0000 mg | Freq: Four times a day (QID) | RECTAL | Status: DC | PRN

## 2019-04-25 MED ORDER — LABETALOL HCL 5 MG/ML IV SOLN
10.0000 mg | INTRAVENOUS | Status: DC | PRN
  Administered 2019-04-25 – 2019-04-28 (×7): 10 mg via INTRAVENOUS
  Filled 2019-04-25 (×7): qty 4

## 2019-04-25 MED ORDER — GABAPENTIN 100 MG PO CAPS
100.0000 mg | ORAL_CAPSULE | Freq: Every day | ORAL | Status: DC
  Administered 2019-04-25 – 2019-04-28 (×4): 100 mg via ORAL
  Filled 2019-04-25 (×4): qty 1

## 2019-04-25 MED ORDER — AMLODIPINE BESYLATE 10 MG PO TABS
10.0000 mg | ORAL_TABLET | Freq: Every day | ORAL | Status: DC
  Administered 2019-04-25 – 2019-04-28 (×4): 10 mg via ORAL
  Filled 2019-04-25 (×2): qty 1
  Filled 2019-04-25: qty 2
  Filled 2019-04-25: qty 1

## 2019-04-25 MED ORDER — PRAVASTATIN SODIUM 10 MG PO TABS
20.0000 mg | ORAL_TABLET | Freq: Every day | ORAL | Status: DC
  Administered 2019-04-25 – 2019-04-27 (×3): 20 mg via ORAL
  Filled 2019-04-25 (×3): qty 2

## 2019-04-25 NOTE — ED Notes (Signed)
Pt in x-ray at this time

## 2019-04-25 NOTE — ED Provider Notes (Signed)
TIME SEEN: 12:09 AM  CHIEF COMPLAINT: Chest pain, shortness of breath, palpitations  HPI: Patient is a 44 year old female with history of SVT, hypertension, hyperlipidemia, anemia, end-stage renal disease now on hemodialysis Monday, Wednesday and Friday he was last dialyzed today who presents to the emergency department with palpitations that started just prior to arrival.  States her heart rate was in the 160s at home but she states she felt it going higher.  She had some left-sided chest pressure, heaviness and difficulty breathing.  She is on metoprolol once a day.  Reports compliance with her medications.  No fevers, vomiting, diarrhea, cough over the past several days.  Was just recently discharged from [facility] Saturday, April 10 after she had peritonitis.  Reports her PD catheter was removed and she was switched to hemodialysis.  She has a tunneled catheter in the right chest wall.  She reports her abdominal pain has resolved.  She is having normal bowel movements.  Given 6 mg of IV adenosine with EMS and converted to a sinus rhythm.  Cardiology -[facility]  ROS: See HPI Constitutional: no fever  Eyes: no drainage  ENT: no runny nose   Cardiovascular:   chest pain  Resp:  SOB  GI: no vomiting GU: no dysuria Integumentary: no rash  Allergy: no hives  Musculoskeletal: no leg swelling  Neurological: no slurred speech ROS otherwise negative  PAST MEDICAL HISTORY/PAST SURGICAL HISTORY:  Past Medical History:  Diagnosis Date  . Acid reflux   . Anxiety   . Arthritis   . Dyspnea   . Dysrhythmia    tachycardia  . ESRD (end stage renal disease) ([place])    [facility]SAT- [place]  . GI bleeding 12/2017  . Gout   . Headache(784.0)    "related to chemo; sometimes weekly" (09/12/2013)  . High cholesterol   . History of blood transfusion "a couple"   "related to low counts"  . Hypertension   . Iron deficiency anemia    "get epogen shots q month"  (02/20/2014)  . 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 Peritoneal dialysis status ([place])   . Stroke [facility]) 08/15/2017    MEDICATIONS:  Prior to Admission medications   Medication Sig Start Date End Date Taking? Authorizing Provider  allopurinol (ZYLOPRIM) 100 MG tablet Take 100 mg by mouth at bedtime.     [provider]  ALPRAZolam Duanne Moron) 1 MG tablet Take 1 mg by mouth 2 (two) times daily as needed for anxiety.  03/19/17   [provider]  calcitRIOL (ROCALTROL) 0.5 MCG capsule Take 1 capsule (0.5 mcg total) by mouth daily with breakfast. Patient not taking: Reported on 08/23/2018 06/04/17   Bonnielee Haff, MD  cephALEXin (KEFLEX) 500 MG capsule Take 500 mg by mouth 2 (two) times daily. 08/17/18   [provider]  gentamicin ointment (GARAMYCIN) 0.1 % Apply 1 application topically daily as needed (changing dressing.).    [provider]  hydrOXYzine (ATARAX/VISTARIL) 25 MG tablet Take 25 mg by mouth every 6 (six) hours as needed for itching.  01/25/18   [provider]  lisinopril (ZESTRIL) 20 MG tablet Take 20 mg by mouth at bedtime. 06/09/18   [provider]  meclizine (ANTIVERT) 12.5 MG tablet Take 12.5 mg by mouth 2 (two) times daily as needed for dizziness. 08/17/18   [provider]  metoprolol tartrate (LOPRESSOR) 25 MG tablet Take  25 mg by mouth at bedtime.  04/17/16   [provider]  multivitamin (RENA-VIT) TABS tablet Take 1 tablet by mouth at bedtime.     [provider]  Omega-3 Fatty Acids (FISH OIL PO) Take 1 tablet by mouth daily.    [provider]  omeprazole (PRILOSEC) 40 MG capsule Take 40 mg by mouth 2 (two) times daily.    [provider]  potassium chloride (K-DUR) 10 MEQ tablet Take 1 tablet (10 mEq total) by mouth daily. 08/26/18   Oda Kilts, MD  pravastatin (PRAVACHOL) 20 MG tablet Take 20 mg by mouth at  bedtime. 06/24/18   [provider]  prochlorperazine (COMPAZINE) 10 MG tablet Take 1 tablet (10 mg total) by mouth every 8 (eight) hours as needed for nausea or vomiting. 02/17/18   Mansouraty, Telford Nab., MD  promethazine (PHENERGAN) 25 MG tablet Take 1 tablet (25 mg total) by mouth every 8 (eight) hours as needed for nausea or vomiting. 04/07/18   Shirley, [place], DO  sucroferric oxyhydroxide (VELPHORO) 500 MG chewable tablet Chew 1,000 mg by mouth 3 (three) times daily before meals.     [provider]  venlafaxine XR (EFFEXOR-XR) 75 MG 24 hr capsule Take 75 mg by mouth at bedtime.     [provider]    ALLERGIES:  Allergies  Allergen Reactions  . Nsaids Other (See Comments)    Cannot take due to Kidney disease/Kidney function   . Tolmetin Other (See Comments)    Cannot take due to Kidney Disease    SOCIAL HISTORY:  Social History   Tobacco Use  . Smoking status: Never Smoker  . Smokeless tobacco: Never Used  Substance Use Topics  . Alcohol use: No    FAMILY HISTORY: Family History  Problem Relation Age of Onset  . Hypertension Mother   . Thyroid disease Mother   . Coronary artery disease Father   . Hypertension Father   . Diabetes Father   . Colon cancer Maternal Aunt   . Esophageal cancer Maternal Uncle   . Inflammatory bowel disease Neg Hx   . Liver disease Neg Hx   . Pancreatic cancer Neg Hx   . Rectal cancer Neg Hx   . Stomach cancer Neg Hx     EXAM: BP (!) 190/85 (BP Location: Right Arm)   Pulse 86   Temp (!) 96.9 F (36.1 C) (Tympanic)   Resp 16   Ht $R'5\' 7"'Ri$  (1.702 m)   Wt 122 kg   SpO2 100%   BMI 42.13 kg/m  CONSTITUTIONAL: Alert and oriented and responds appropriately to questions. Well-appearing; well-nourished, obese HEAD: Normocephalic EYES: Conjunctivae clear, pupils appear equal, EOM appear intact ENT: normal nose; moist mucous membranes NECK: Supple, normal ROM CARD: RRR; S1 and S2 appreciated; no murmurs, no  clicks, no rubs, no gallops CHEST: Tunneled catheter in the right chest wall without surrounding redness, warmth, bleeding or drainage RESP: Normal chest excursion without splinting or tachypnea; breath sounds clear and equal bilaterally; no wheezes, no rhonchi, no rales, no hypoxia or respiratory distress, speaking full sentences ABD/GI: Normal bowel sounds; non-distended; soft, non-tender, no rebound, no guarding, no peritoneal signs, no hepatosplenomegaly, healing surgical scars to the abdomen without redness, warmth, bleeding or drainage BACK:  The back appears normal EXT: Normal ROM in all joints; no deformity noted, no edema; no cyanosis, no calf tenderness or calf swelling SKIN: Normal color for age and race; warm; no rash on exposed skin NEURO: Moves all  extremities equally PSYCH: The patient's mood and manner are appropriate.   MEDICAL DECISION MAKING: Patient here with resolved SVT.  Still having some residual chest pain and shortness of breath.  Currently in a sinus rhythm.  QT interval is slightly long at 490.  Will check electrolytes, cardiac labs, hemoglobin given her history of anemia with previous transfusions.  Will obtain chest x-ray.  She is at risk for PE given her recent hospitalization but is not currently tachycardic, hypotensive, hypoxic or tachypneic.  Will hold on D-dimer at this time and monitor to see if symptoms continue to improve.  ED PROGRESS: Labs reviewed/interpreted.  Patient's labs show hemoglobin of 6.6.  She denies bloody stools, melena.  No vaginal bleeding.  She is status post hysterectomy.  Potassium normal.  Mag is 1.5.  Will give replacement.  Troponin slightly elevated at 36 but this is in the setting of recent SVT as well as chronic kidney disease.  Second troponin pending.  Patient still having some chest pain and shortness of breath.  This could also be due to symptomatic anemia.  Have ordered 2 units of packed red blood cells.  She agrees on transfusion.   States she was recently transfused during her admission at [facility].  PCP with [facility].  Will discuss with hospitalist for admission.  Patient continues to be hemodynamically stable.   2:07 AM Discussed patient's case with hospitalist, Dr. Marlowe Sax.  I have recommended admission and patient (and family if present) agree with this plan. Admitting physician will place admission orders.   I reviewed all nursing notes, vitals, pertinent previous records and reviewed/interpreted all EKGs, lab and urine results, imaging (as available).    EKG Interpretation  Date/Time:  Monday April 24 2019 23:58:48 EDT Ventricular Rate:  89 PR Interval:    QRS Duration: 97 QT Interval:  402 QTC Calculation: 490 R Axis:   36 Text Interpretation: Sinus rhythm Borderline prolonged QT interval No significant change since last tracing Confirmed by Pryor Curia [phone]) on 04/25/2019 12:09:27 AM           CRITICAL CARE Performed by: Cyril Mourning Rockie Vawter   Total critical care time: 55 minutes  Critical care time was exclusive of separately billable procedures and treating other patients.  Critical care was necessary to treat or prevent imminent or life-threatening deterioration.  Critical care was time spent personally by me on the following activities: development of treatment plan with patient and/or surrogate as well as nursing, discussions with consultants, evaluation of patient's response to treatment, examination of patient, obtaining history from patient or surrogate, ordering and performing treatments and interventions, ordering and review of laboratory studies, ordering and review of radiographic studies, pulse oximetry and re-evaluation of patient's condition.   Emari Hout was evaluated in Emergency Department on 04/25/2019 for the symptoms described in the history of present illness. She was evaluated in the context of the global COVID-19 pandemic, which necessitated consideration that the patient  might be at risk for infection with the SARS-CoV-2 virus that causes COVID-19. Institutional protocols and algorithms that pertain to the evaluation of patients at risk for COVID-19 are in a state of rapid change based on information released by regulatory bodies including the CDC and federal and state organizations. These policies and algorithms were followed during the patient's care in the ED.      Marshea Wisher, Delice Bison, DO 04/25/19 [phone]

## 2019-04-25 NOTE — Progress Notes (Signed)
Admission from earlier this morning.  Briefly, 44 year old female with history of PAT on metoprolol, ESRD on HD MWF, GIB/IDA, CVA and anxiety presenting with chest pain, shortness of breath and palpitation and found to be anemic with Hgb down to 6.6 (previously 8.0).  Reportedly had SVT when EMS arrived but converted to NSR with adenosine.  HR has been within normal range since then.  Has mild HS trop (36> 60> 90).  EKG without acute ischemic finding.  Received 2 units of blood.  Subjective: Complaining generalized headache that she rates 6/10.  Reports getting headache whenever her blood pressure is elevated, which seems to be the case now.  Has no focal neuro symptoms.  Chest pain and shortness of breath improved.  BP markedly elevated now.  Vitals:   04/25/19 1630 04/25/19 1645 04/25/19 1700 04/25/19 1842  BP: (!) 176/106 (!) 183/99 (!) 174/91 (!) 194/100  Pulse: 76 74 76 73  Resp: $Remo'20 17 13 18  'EosAM$ Temp:    98.7 F (37.1 C)  TempSrc:    Oral  SpO2: 100% 98% 97% 97%  Weight:      Height:       GENERAL: No apparent distress.  Nontoxic. HEENT: MMM.  Vision and hearing grossly intact.  NECK: Supple.  No apparent JVD.  RESP: On room air.  No IWOB.  Fair aeration bilaterally.  No crackles or wheeze. CVS:  RRR. Heart sounds normal.  ABD/GI/GU: Bowel sounds present. Soft. Non tender.  MSK/EXT:  Moves extremities. No apparent deformity. No edema.  SKIN: no apparent skin lesion or wound NEURO: Awake, alert and oriented appropriately.  No apparent focal neuro deficit. PSYCH: Calm. Normal affect.  Assessment and plan Symptomatic anemia-refused 2 units with appropriate response.  Patient denies melena or hematochezia.  FOBT negative. -Iron infusion and ESA per nephrology.  Chest pain/dyspnea/elevated troponin-suspect this to be demand ischemia in the setting of anemia, SVT and uncontrolled hypertension.  Troponin elevation could be a combination of delayed clearance due to ESRD and demand ischemia  as above. -Optimize blood pressure control  ESRD on HD MWF-reports completing her session yesterday. -Nephrology consulted  Uncontrolled hypertension: SBP as high as 190.  DBP as high as 112. -Increase home metoprolol tartrate to 50 mg twice daily -Add amlodipine 10 mg daily -As needed labetalol with parameters  Headache: Could be due to hypertension.  No focal neuro deficit or red flags. -As needed Tylenol -Optimize BP control   PAT/SVT-HR within normal range after adenosine by EMS. -Beta-blockers as above   DVT prophylaxis: Start subcu heparin.  FOBT negative.   No charge service  Davarius Ridener T. [facility] Pager [phone]  If 7PM-7AM, please contact night-coverage [email or web address] Password [facility] 04/25/2019, 6:59 PM

## 2019-04-25 NOTE — ED Notes (Signed)
Lunch Tray Ordered @ [id number].

## 2019-04-25 NOTE — ED Notes (Signed)
Pt states that she is short of breath, with pain when laying on left side- , pulse ox = 98%, Dr. notified

## 2019-04-25 NOTE — Consult Note (Addendum)
[facility] Renal Consultation Note    Indication for Consultation:  Management of ESRD/hemodialysis; anemia, hypertension/volume and secondary hyperparathyroidism PCP: Blair Heys [facility]  HPI: Patricia [name] is a 44 y.o. female with ESRD, formerly on PD now on hemodialysis for two weeks S/P peritonitis with removal of PD catheter 2 weeks ago in [place]. PMH: Minimal change GN, HTN, morbid obesity, gout, CVA, GIB, GERD, AOCD, SHPT. H/O multiple failed AV accesses. Last HD 04/24/2019. Was noted to be hypertensive with low HGB 7.0 at OP HD center. EDW was challenged and lowered appropriately for HTN, do not see that ESA was given.   She presented to ED via EMS for C/O palpitations, chest pain and SOB. EMS reports SVT with rate 180s-rec'd adenosine, converted to SR. Na 141 K+ 3.9 CO2 33 SCr 8.87 BUN 26 HGB 6.6 WBC 7.0 PLT 232 CXR with bibasilar atelectasis. She received 2 units of PRBCs in ED, Magnesium 2 grams IV for magnesium 1.5.   Seen in ED, says she started having palpitations around last 7PM last PM. Took metoprolol without improvement. Also complains of "chest heaviness" and SOB with palpitations. Currently in SR, in NAD. No evidence of volume overload by exam. Will have HD tomorrow morning on schedule. She plans to return to PD after resting from peritonitis.   Past Medical History:  Diagnosis Date  . Acid reflux   . Anxiety   . Arthritis   . Dyspnea   . Dysrhythmia    tachycardia  . ESRD (end stage renal disease) ([place])    TTHSAT- [place]  . GI bleeding 12/2017  . Gout   . Headache(784.0)    "related to chemo; sometimes weekly" (09/12/2013)  . High cholesterol   . History of blood transfusion "a couple"   "related to low counts"  . Hypertension   . Iron deficiency anemia    "get epogen shots q month" (02/20/2014)  . 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 Peritoneal dialysis status ([place])   . Stroke  [facility]) 08/15/2017   Past Surgical History:  Procedure Laterality Date  . ABDOMINAL HYSTERECTOMY  2010   "laparoscopic"  . ANKLE FRACTURE SURGERY Right 1994  . AV FISTULA PLACEMENT Left 04/14/2016   Procedure: ARTERIOVENOUS (AV) FISTULA CREATION;  Surgeon: Angelia Mould, MD;  Location: [facility] OR;  Service: Vascular;  Laterality: Left;  . AV FISTULA PLACEMENT Left 08/09/2017   Procedure: INSERTION OF ARTERIOVENOUS (AV) GORE-TEX GRAFT LEFT ARM;  Surgeon: Elam Dutch, MD;  Location: [place];  Service: Vascular;  Laterality: Left;  . [place] REMOVAL Left 08/11/2017   Procedure: REMOVAL OF ARTERIOVENOUS GORETEX GRAFT ([place]);  Surgeon: Serafina Mitchell, MD;  Location[facility] OR;  Service: Vascular;  Laterality: Left;  . BIOPSY  09/03/2017   Procedure: BIOPSY;  Surgeon: Otis Brace, MD;  Location: [place];  Service: Gastroenterology;;  . BIOPSY  12/24/2017   Procedure: BIOPSY;  Surgeon: Irving Copas., MD;  Location: [place];  Service: Gastroenterology;;  . CARDIAC CATHETERIZATION  2000's  . COLONOSCOPY WITH PROPOFOL N/A 09/04/2017   Procedure: COLONOSCOPY WITH PROPOFOL;  Surgeon: Ronnette Juniper, MD;  Location: [place];  Service: Gastroenterology;  Laterality: N/A;  . ENTEROSCOPY N/A 12/24/2017   Procedure: ENTEROSCOPY;  Surgeon: Rush Landmark Telford Nab., MD;  Location: [place];  Service: Gastroenterology;  Laterality: N/A;  . ESOPHAGOGASTRODUODENOSCOPY    . ESOPHAGOGASTRODUODENOSCOPY (EGD) WITH PROPOFOL Left 06/04/2017   Procedure: ESOPHAGOGASTRODUODENOSCOPY (EGD)  WITH PROPOFOL;  Surgeon: Arta Silence, MD;  Location: [place];  Service: Endoscopy;  Laterality: Left;  . ESOPHAGOGASTRODUODENOSCOPY (EGD) WITH PROPOFOL N/A 09/03/2017   Procedure: ESOPHAGOGASTRODUODENOSCOPY (EGD) WITH PROPOFOL;  Surgeon: Otis Brace, MD;  Location: [place];  Service: Gastroenterology;  Laterality: N/A;  . FISTULA SUPERFICIALIZATION Left 04/02/2017   Procedure: FISTULA  SUPERFICIALIZATION LEFT ARM;  Surgeon: Serafina Mitchell, MD;  Location: [place];  Service: Vascular;  Laterality: Left;  . FISTULOGRAM Left 04/07/2016   Procedure: FISTULOGRAM;  Surgeon: Waynetta Sandy, MD;  Location: [place];  Service: Vascular;  Laterality: Left;  . FRACTURE SURGERY     right ankle  . GIVENS CAPSULE STUDY N/A 10/29/2017   Procedure: GIVENS CAPSULE STUDY;  Surgeon: Wilford Corner, MD;  Location: [place];  Service: Endoscopy;  Laterality: N/A;  . GIVENS CAPSULE STUDY N/A 12/24/2017   Procedure: GIVENS CAPSULE STUDY;  Surgeon: Irving Copas., MD;  Location: [place];  Service: Gastroenterology;  Laterality: N/A;  . GIVENS CAPSULE STUDY N/A 05/22/2018   Procedure: GIVENS CAPSULE STUDY;  Surgeon: Irving Copas., MD;  Location: [place];  Service: Gastroenterology;  Laterality: N/A;  . INSERTION OF DIALYSIS CATHETER Right 04/07/2016   Procedure: INSERTION OF DIALYSIS CATHETER;  Surgeon: Waynetta Sandy, MD;  Location: [place];  Service: Vascular;  Laterality: Right;  . INSERTION OF DIALYSIS CATHETER Right 04/04/2017   Procedure: INSERTION OF DIALYSIS CATHETER;  Surgeon: Waynetta Sandy, MD;  Location: [place];  Service: Vascular;  Laterality: Right;  . LIGATION OF ARTERIOVENOUS  FISTULA Left 04/14/2016   Procedure: LIGATION OF ARTERIOVENOUS  FISTULA;  Surgeon: Angelia Mould, MD;  Location: [place];  Service: Vascular;  Laterality: Left;  . PATCH ANGIOPLASTY Left 06/19/2016   Procedure: PATCH ANGIOPLASTY LEFT ARTERIOVENOUS FISTULA;  Surgeon: Angelia Mould, MD;  Location: [place];  Service: Vascular;  Laterality: Left;  . POLYPECTOMY  09/04/2017   Procedure: POLYPECTOMY;  Surgeon: Ronnette Juniper, MD;  Location: [facility] ENDOSCOPY;  Service: Gastroenterology;;  . REVISON OF ARTERIOVENOUS FISTULA Left 06/19/2016   Procedure: REVISION SUPERFICIALIZATION OF BRACHIOCEPHALIC ARTERIOVENOUS FISTULA;  Surgeon: Angelia Mould, MD;   Location: [facility] OR;  Service: Vascular;  Laterality: Left;   Family History  Problem Relation Age of Onset  . Hypertension Mother   . Thyroid disease Mother   . Coronary artery disease Father   . Hypertension Father   . Diabetes Father   . Colon cancer Maternal Aunt   . Esophageal cancer Maternal Uncle   . Inflammatory bowel disease Neg Hx   . Liver disease Neg Hx   . Pancreatic cancer Neg Hx   . Rectal cancer Neg Hx   . Stomach cancer Neg Hx    Social History:  reports that she has never smoked. She has never used smokeless tobacco. She reports that she does not drink alcohol or use drugs. Allergies  Allergen Reactions  . Nsaids Other (See Comments)    Cannot take due to Kidney disease/Kidney function   . Tolmetin Other (See Comments)    Cannot take due to Kidney Disease   Prior to Admission medications   Medication Sig Start Date End Date Taking? Authorizing Provider  allopurinol (ZYLOPRIM) 100 MG tablet Take 100 mg by mouth at bedtime.    Yes [provider]  ALPRAZolam Duanne Moron) 1 MG tablet Take 1 mg by mouth 2 (two) times daily as needed for anxiety.  03/19/17  Yes [provider]  calcitRIOL (ROCALTROL) 0.5 MCG capsule Take  1 capsule (0.5 mcg total) by mouth daily with breakfast. 06/04/17  Yes Bonnielee Haff, MD  gabapentin (NEURONTIN) 100 MG capsule Take 100 mg by mouth daily. 04/24/19  Yes [provider]  hydrOXYzine (ATARAX/VISTARIL) 25 MG tablet Take 25 mg by mouth every 6 (six) hours as needed for itching.  01/25/18  Yes [provider]  metoprolol tartrate (LOPRESSOR) 25 MG tablet Take 25 mg by mouth at bedtime.  04/17/16  Yes [provider]  multivitamin (RENA-VIT) TABS tablet Take 1 tablet by mouth at bedtime.    Yes [provider]  omeprazole (PRILOSEC) 40 MG capsule Take 40 mg by mouth 2 (two) times daily.   Yes [provider]  potassium chloride (K-DUR) 10 MEQ tablet Take 1 tablet (10 mEq total) by mouth  daily. 08/26/18  Yes Oda Kilts, MD  pravastatin (PRAVACHOL) 20 MG tablet Take 20 mg by mouth at bedtime. 06/24/18  Yes [provider]  promethazine (PHENERGAN) 25 MG tablet Take 1 tablet (25 mg total) by mouth every 8 (eight) hours as needed for nausea or vomiting. 04/07/18  Yes Enid Derry, [place], DO  prochlorperazine (COMPAZINE) 10 MG tablet Take 1 tablet (10 mg total) by mouth every 8 (eight) hours as needed for nausea or vomiting. Patient not taking: Reported on 04/25/2019 02/17/18   Mansouraty, Telford Nab., MD   Current Facility-Administered Medications  Medication Dose Route Frequency Provider Last Rate Last Admin  . 0.9 %  sodium chloride infusion  10 mL/hr Intravenous Once Shela Leff, MD      . acetaminophen (TYLENOL) tablet 650 mg  650 mg Oral Q6H PRN Shela Leff, MD   650 mg at 04/25/19 0910   Or  . acetaminophen (TYLENOL) suppository 650 mg  650 mg Rectal Q6H PRN Shela Leff, MD      . allopurinol (ZYLOPRIM) tablet 100 mg  100 mg Oral QHS Shela Leff, MD      . ALPRAZolam Duanne Moron) tablet 1 mg  1 mg Oral BID PRN Shela Leff, MD      . amLODipine (NORVASC) tablet 10 mg  10 mg Oral Daily Wendee Beavers T, MD   10 mg at 04/25/19 1015  . calcitRIOL (ROCALTROL) capsule 0.5 mcg  0.5 mcg Oral Q breakfast Shela Leff, MD   0.5 mcg at 04/25/19 0900  . gabapentin (NEURONTIN) capsule 100 mg  100 mg Oral Daily Shela Leff, MD   100 mg at 04/25/19 0910  . labetalol (NORMODYNE) injection 10 mg  10 mg Intravenous Q2H PRN Wendee Beavers T, MD      . metoprolol tartrate (LOPRESSOR) tablet 50 mg  50 mg Oral BID Wendee Beavers T, MD   50 mg at 04/25/19 1132  . multivitamin (RENA-VIT) tablet 1 tablet  1 tablet Oral QHS Shela Leff, MD      . pantoprazole (PROTONIX) injection 40 mg  40 mg Intravenous Q12H Shela Leff, MD   40 mg at 04/25/19 0910  . pravastatin (PRAVACHOL) tablet 20 mg  20 mg Oral QHS Shela Leff, MD      . sodium  chloride flush (NS) 0.9 % injection 3 mL  3 mL Intravenous Once Shela Leff, MD       Current Outpatient Medications  Medication Sig Dispense Refill  . allopurinol (ZYLOPRIM) 100 MG tablet Take 100 mg by mouth at bedtime.     . ALPRAZolam (XANAX) 1 MG tablet Take 1 mg by mouth 2 (two) times daily as needed for anxiety.     Marland Kitchen  calcitRIOL (ROCALTROL) 0.5 MCG capsule Take 1 capsule (0.5 mcg total) by mouth daily with breakfast. 30 capsule 0  . gabapentin (NEURONTIN) 100 MG capsule Take 100 mg by mouth daily.    . hydrOXYzine (ATARAX/VISTARIL) 25 MG tablet Take 25 mg by mouth every 6 (six) hours as needed for itching.     . metoprolol tartrate (LOPRESSOR) 25 MG tablet Take 25 mg by mouth at bedtime.     . multivitamin (RENA-VIT) TABS tablet Take 1 tablet by mouth at bedtime.     Marland Kitchen omeprazole (PRILOSEC) 40 MG capsule Take 40 mg by mouth 2 (two) times daily.    . potassium chloride (K-DUR) 10 MEQ tablet Take 1 tablet (10 mEq total) by mouth daily. 7 tablet 0  . pravastatin (PRAVACHOL) 20 MG tablet Take 20 mg by mouth at bedtime.    . promethazine (PHENERGAN) 25 MG tablet Take 1 tablet (25 mg total) by mouth every 8 (eight) hours as needed for nausea or vomiting. 10 tablet 0  . prochlorperazine (COMPAZINE) 10 MG tablet Take 1 tablet (10 mg total) by mouth every 8 (eight) hours as needed for nausea or vomiting. (Patient not taking: Reported on 04/25/2019) 50 tablet 2   Labs: Basic Metabolic Panel: Recent Labs  Lab 04/25/19 0037 04/25/19 0151 04/25/19 1225  NA 142  --  141  K 3.9  --  5.3*  CL 101  --  100  CO2 33*  --  23  GLUCOSE 130*  --  103*  BUN 26*  --  35*  CREATININE 8.87*  --  10.47*  CALCIUM 8.4*  --  9.3  PHOS  --  4.6  --    Liver Function Tests: No results for input(s): AST, ALT, ALKPHOS, BILITOT, PROT, ALBUMIN in the last 168 hours. No results for input(s): LIPASE, AMYLASE in the last 168 hours. No results for input(s): AMMONIA in the last 168 hours. CBC: Recent Labs   Lab 04/25/19 0037 04/25/19 1225  WBC 7.0  --   HGB 6.6* 7.8*  HCT 22.2* 25.4*  MCV 92.9  --   PLT 232  --    Cardiac Enzymes: No results for input(s): CKTOTAL, CKMB, CKMBINDEX, TROPONINI in the last 168 hours. CBG: No results for input(s): GLUCAP in the last 168 hours. Iron Studies:  Recent Labs    04/25/19 1222  IRON 66  TIBC 223*  FERRITIN 1,151*   Studies/Results: DG Chest 2 View  Result Date: 04/25/2019 CLINICAL DATA:  SVT EXAM: CHEST - 2 VIEW COMPARISON:  10/23/2018 FINDINGS: Right dialysis catheter in place with the tip in the right atrium. Heart is borderline in size. Bibasilar atelectasis. No effusions or edema. No acute bony abnormality. IMPRESSION: Bibasilar atelectasis. Electronically Signed   By: Rolm Baptise M.D.   On: 04/25/2019 00:33   DG Chest Port 1 View  Result Date: 04/25/2019 CLINICAL DATA:  Shortness of breath. EXAM: PORTABLE CHEST 1 VIEW COMPARISON:  Chest x-ray from same day at 00:12 hours. FINDINGS: Unchanged tunneled right internal jugular dialysis catheter. The heart size and mediastinal contours are within normal limits. Normal pulmonary vascularity. No focal consolidation, pleural effusion, or pneumothorax. No acute osseous abnormality. IMPRESSION: No active disease. Electronically Signed   By: Titus Dubin M.D.   On: 04/25/2019 14:30    ROS: As per HPI otherwise negative.   Physical Exam: Vitals:   04/25/19 1000 04/25/19 1100 04/25/19 1300 04/25/19 1400  BP: (!) 183/112 (!) 179/100  (!) 190/94  Pulse: 77 71 71  70  Resp: (!) 26 10 (!) 24 19  Temp:      TempSrc:      SpO2: 96% 94% 97% 98%  Weight:      Height:         General: Well developed, well nourished, in no acute distress. Head: Normocephalic, atraumatic, sclera non-icteric, mucus membranes are moist Neck: Supple. JVD not elevated. Lungs: Clear bilaterally to auscultation without wheezes, rales, or rhonchi. Breathing is unlabored. Heart: RRR with S1 S2. No murmurs, rubs, or  gallops appreciated. Abdomen: Soft, non-tender, non-distended with normoactive bowel sounds. No rebound/guarding. No obvious abdominal masses. M-S:  Strength and tone appear normal for age. Lower extremities:without edema or ischemic changes, no open wounds  Neuro: Alert and oriented X 3. Moves all extremities spontaneously. Psych:  Responds to questions appropriately with a normal affect. Dialysis Access: RIJ TDC drsg CDI  Dialysis Orders: [place] MWF 4 hrs 180NRe 400/800 111.5 kg 2.0 K/2.0 Ca RIJ TDC -Heparin 5000 units IV TIW -No ESA/Venofer ordered (Last HGB 7.0 04/17/19 Tsat 41 03/13/19) -No incenter VDRA (still taking Calcitriol 1 mcg PO daily from PD orders)  Assessment/Plan: 1.  Symptomatic Anemia-HGB 7.0 04/19/2019 at OP center. FOBT negative. She takes Auryxia binders so will have dark stools. No recent OP ESA. Has rec'd 2 units of PRBCs. No repeat HGB. CBC ordered. 2. SVT-converted to SR with adenosine. Per primary. Has been followed as OP by Dr. Sallyanne Kuster.  3.  Elevated troponin in setting of ESRD-per primary 4.  ESRD - MWF next HD in AM. 2.0 K bath. Hold heparin until W/U for GIB completed per primary.  5.  Hypertension/volume-No overt evidence of volume overload but SOB after receiving 2 units of PRBCS. BP elevated. EDW recently lowered. Continue home BP meds, lowering volume as tolerated.  6.  Anemia  - As noted in #1. Give Aranesp 100 mcg IV with HD tomorrow. Tsat 30% 7.  Metabolic bone disease -  Continue daily oral calcitriol, binders.  8.  Nutrition - On clear liquids. Renal diet when able to eat, renal vits, nepro  Cassity Christian H. Owens Shark, NP-C 04/25/2019, 3:19 PM  [employer] [phone]

## 2019-04-25 NOTE — Progress Notes (Signed)
Patient reports difficulty breathing. Respirations and pulse ox within normal limits. Started on 2L O2 [place] for comfort. Denies chest pain. MD notified. No new orders at this time.

## 2019-04-25 NOTE — H&P (Signed)
History and Physical    Patricia [name] DQQ:[id number] DOB: [birth year] DOA: 04/24/2019  PCP: Blair Heys, PA-C Patient coming from: Home  Chief Complaint: Chest pain, shortness of breath, palpitations  HPI: Patricia [name] is a 44 y.o. female with medical history significant of paroxysmal atrial tachycardia, ESRD on HD, history of GI bleed, iron deficiency anemia status post blood transfusion, gout, hypertension, hyperlipidemia GERD, anxiety, CVA presenting with complaints of chest pain, shortness of breath, and palpitations.  EMS reported SVT with heart rate in the 180s.  Patient was given adenosine and converted to sinus rhythm.  Patient states yesterday around 7 PM she started having heart palpitations.  She felt short of breath at that time and had left-sided chest heaviness.  Reports taking metoprolol at home and has not missed any doses.  Denies history of blood clots.  Denies melena, hematochezia, or hematemesis.  Denies fevers, chills, or cough.  She goes for dialysis every MWF and her last session was yesterday.  ED Course: Afebrile.  Not tachycardic or hypoxic.  Blood pressure elevated on initial labs, subsequently improved.  Labs showing no leukocytosis.  Hemoglobin 6.6, previously above 8.  Potassium 3.9.  Magnesium 1.5.  High-sensitivity troponin 36 >60.  EKG without acute ischemic changes.  SARS-CoV-2 PCR test pending. Chest x-ray showing bibasilar atelectasis.  Patient received IV magnesium 2 g.  2 units PRBCs ordered.  Review of Systems:  All systems reviewed and apart from history of presenting illness, are negative.  Past Medical History:  Diagnosis Date  . Acid reflux   . Anxiety   . Arthritis   . Dyspnea   . Dysrhythmia    tachycardia  . ESRD (end stage renal disease) ([place])    [facility]- [place]  . GI bleeding 12/2017  . Gout   . Headache(784.0)    "related to chemo; sometimes weekly" (09/12/2013)  . High cholesterol   . History of blood transfusion "a couple"   "related to low counts"  . Hypertension   . Iron deficiency anemia    "get epogen shots q month" (02/20/2014)  . 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 Peritoneal dialysis status ([place])   . Stroke [facility]) 08/15/2017    Past Surgical History:  Procedure Laterality Date  . ABDOMINAL HYSTERECTOMY  2010   "laparoscopic"  . ANKLE FRACTURE SURGERY Right 1994  . AV FISTULA PLACEMENT Left 04/14/2016   Procedure: ARTERIOVENOUS (AV) FISTULA CREATION;  Surgeon: Angelia Mould, MD;  [facility];  Service: Vascular;  Laterality: Left;  . AV FISTULA PLACEMENT Left 08/09/2017   Procedure: INSERTION OF ARTERIOVENOUS (AV) GORE-TEX GRAFT LEFT ARM;  Surgeon: Elam Dutch, MD;  Location: [place];  Service: Vascular;  Laterality: Left;  . [place] REMOVAL Left 08/11/2017   Procedure: REMOVAL OF ARTERIOVENOUS GORETEX GRAFT ([place]);  Surgeon: Serafina Mitchell, MD;  Location[facility] OR;  Service: Vascular;  Laterality: Left;  . BIOPSY  09/03/2017   Procedure: BIOPSY;  Surgeon: Otis Brace, MD;  Location: [place];  Service: Gastroenterology;;  . BIOPSY  12/24/2017   Procedure: BIOPSY;  Surgeon: Irving Copas., MD;  Location: [place];  Service: Gastroenterology;;  . CARDIAC CATHETERIZATION  2000's  . COLONOSCOPY WITH PROPOFOL N/A 09/04/2017   Procedure: COLONOSCOPY WITH PROPOFOL;  Surgeon: Ronnette Juniper, MD;  Location: [place];  Service: Gastroenterology;  Laterality: N/A;  . ENTEROSCOPY N/A 12/24/2017   Procedure: ENTEROSCOPY;  Surgeon: Irving Copas., MD;  Location: [facility] ENDOSCOPY;  Service: Gastroenterology;  Laterality: N/A;  . ESOPHAGOGASTRODUODENOSCOPY    . ESOPHAGOGASTRODUODENOSCOPY (EGD) WITH PROPOFOL Left 06/04/2017   Procedure: ESOPHAGOGASTRODUODENOSCOPY (EGD) WITH PROPOFOL;  Surgeon: Arta Silence, MD;  Location: [place];  Service: Endoscopy;  Laterality: Left;  . ESOPHAGOGASTRODUODENOSCOPY (EGD) WITH  PROPOFOL N/A 09/03/2017   Procedure: ESOPHAGOGASTRODUODENOSCOPY (EGD) WITH PROPOFOL;  Surgeon: Otis Brace, MD;  Location: [place];  Service: Gastroenterology;  Laterality: N/A;  . FISTULA SUPERFICIALIZATION Left 04/02/2017   Procedure: FISTULA SUPERFICIALIZATION LEFT ARM;  Surgeon: Serafina Mitchell, MD;  Location: [place];  Service: Vascular;  Laterality: Left;  . FISTULOGRAM Left 04/07/2016   Procedure: FISTULOGRAM;  Surgeon: Waynetta Sandy, MD;  Location: [place];  Service: Vascular;  Laterality: Left;  . FRACTURE SURGERY     right ankle  . GIVENS CAPSULE STUDY N/A 10/29/2017   Procedure: GIVENS CAPSULE STUDY;  Surgeon: Wilford Corner, MD;  Location: [place];  Service: Endoscopy;  Laterality: N/A;  . GIVENS CAPSULE STUDY N/A 12/24/2017   Procedure: GIVENS CAPSULE STUDY;  Surgeon: Irving Copas., MD;  Location: [place];  Service: Gastroenterology;  Laterality: N/A;  . GIVENS CAPSULE STUDY N/A 05/22/2018   Procedure: GIVENS CAPSULE STUDY;  Surgeon: Irving Copas., MD;  Location: [place];  Service: Gastroenterology;  Laterality: N/A;  . INSERTION OF DIALYSIS CATHETER Right 04/07/2016   Procedure: INSERTION OF DIALYSIS CATHETER;  Surgeon: Waynetta Sandy, MD;  Location: [place];  Service: Vascular;  Laterality: Right;  . INSERTION OF DIALYSIS CATHETER Right 04/04/2017   Procedure: INSERTION OF DIALYSIS CATHETER;  Surgeon: Waynetta Sandy, MD;  Location: [place];  Service: Vascular;  Laterality: Right;  . LIGATION OF ARTERIOVENOUS  FISTULA Left 04/14/2016   Procedure: LIGATION OF ARTERIOVENOUS  FISTULA;  Surgeon: Angelia Mould, MD;  Location: [place];  Service: Vascular;  Laterality: Left;  . PATCH ANGIOPLASTY Left 06/19/2016   Procedure: PATCH ANGIOPLASTY LEFT ARTERIOVENOUS FISTULA;  Surgeon: Angelia Mould, MD;  Location: [place];  Service: Vascular;  Laterality: Left;  . POLYPECTOMY  09/04/2017   Procedure: POLYPECTOMY;   Surgeon: Ronnette Juniper, MD;  Location: [facility] ENDOSCOPY;  Service: Gastroenterology;;  . REVISON OF ARTERIOVENOUS FISTULA Left 06/19/2016   Procedure: REVISION SUPERFICIALIZATION OF BRACHIOCEPHALIC ARTERIOVENOUS FISTULA;  Surgeon: Angelia Mould, MD;  Location: [place];  Service: Vascular;  Laterality: Left;     reports that she has never smoked. She has never used smokeless tobacco. She reports that she does not drink alcohol or use drugs.  Allergies  Allergen Reactions  . Nsaids Other (See Comments)    Cannot take due to Kidney disease/Kidney function   . Tolmetin Other (See Comments)    Cannot take due to Kidney Disease    Family History  Problem Relation Age of Onset  . Hypertension Mother   . Thyroid disease Mother   . Coronary artery disease Father   . Hypertension Father   . Diabetes Father   . Colon cancer Maternal Aunt   . Esophageal cancer Maternal Uncle   . Inflammatory bowel disease Neg Hx   . Liver disease Neg Hx   . Pancreatic cancer Neg Hx   . Rectal cancer Neg Hx   . Stomach cancer Neg Hx     Prior to Admission medications   Medication Sig Start Date End Date Taking? Authorizing Provider  allopurinol (ZYLOPRIM) 100 MG tablet Take 100 mg by mouth at bedtime.    Yes [provider]  ALPRAZolam Duanne Moron) 1  MG tablet Take 1 mg by mouth 2 (two) times daily as needed for anxiety.  03/19/17  Yes [provider]  calcitRIOL (ROCALTROL) 0.5 MCG capsule Take 1 capsule (0.5 mcg total) by mouth daily with breakfast. 06/04/17  Yes Bonnielee Haff, MD  gabapentin (NEURONTIN) 100 MG capsule Take 100 mg by mouth daily. 04/24/19  Yes [provider]  hydrOXYzine (ATARAX/VISTARIL) 25 MG tablet Take 25 mg by mouth every 6 (six) hours as needed for itching.  01/25/18  Yes [provider]  metoprolol tartrate (LOPRESSOR) 25 MG tablet Take 25 mg by mouth at bedtime.  04/17/16  Yes [provider]  multivitamin (RENA-VIT) TABS tablet Take 1 tablet  by mouth at bedtime.    Yes [provider]  omeprazole (PRILOSEC) 40 MG capsule Take 40 mg by mouth 2 (two) times daily.   Yes [provider]  potassium chloride (K-DUR) 10 MEQ tablet Take 1 tablet (10 mEq total) by mouth daily. 08/26/18  Yes Oda Kilts, MD  pravastatin (PRAVACHOL) 20 MG tablet Take 20 mg by mouth at bedtime. 06/24/18  Yes [provider]  promethazine (PHENERGAN) 25 MG tablet Take 1 tablet (25 mg total) by mouth every 8 (eight) hours as needed for nausea or vomiting. 04/07/18  Yes Enid Derry, [place], DO  prochlorperazine (COMPAZINE) 10 MG tablet Take 1 tablet (10 mg total) by mouth every 8 (eight) hours as needed for nausea or vomiting. Patient not taking: Reported on 04/25/2019 02/17/18   Mansouraty, Telford Nab., MD    Physical Exam: Vitals:   04/25/19 0148 04/25/19 0404 04/25/19 0405 04/25/19 0421  BP: (!) 156/94 (!) 163/87 (!) 141/112 (!) 151/87  Pulse: 77 80 80 78  Resp: 17 (!) $Remo'21 16 16  'IxzCN$ Temp:   (!) 97.5 F (36.4 C) (!) 97.1 F (36.2 C)  TempSrc:    Tympanic  SpO2: 98% 98%    Weight:      Height:        Physical Exam  Constitutional: She is oriented to person, place, and time. She appears well-developed and well-nourished. No distress.  HENT:  Head: Normocephalic.  Eyes: Right eye exhibits no discharge. Left eye exhibits no discharge.  Cardiovascular: Normal rate, regular rhythm and intact distal pulses.  Pulmonary/Chest: Effort normal and breath sounds normal. No respiratory distress. She has no wheezes. She has no rales.  Abdominal: Soft. Bowel sounds are normal. She exhibits no distension. There is no abdominal tenderness. There is no guarding.  Musculoskeletal:        General: No edema.     Cervical back: Neck supple.  Neurological: She is alert and oriented to person, place, and time.  Skin: Skin is warm and dry. She is not diaphoretic.     Labs on Admission: I have personally reviewed following labs and imaging  studies  CBC: Recent Labs  Lab 04/25/19 0037  WBC 7.0  HGB 6.6*  HCT 22.2*  MCV 92.9  PLT 841   Basic Metabolic Panel: Recent Labs  Lab 04/25/19 0037 04/25/19 0151  NA 142  --   K 3.9  --   CL 101  --   CO2 33*  --   GLUCOSE 130*  --   BUN 26*  --   CREATININE 8.87*  --   CALCIUM 8.4*  --   MG 1.5*  --   PHOS  --  4.6   GFR: Estimated Creatinine Clearance: 10.8 mL/min (A) (by C-G formula based on SCr of 8.87 mg/dL (H)).  Liver Function Tests: No results for input(s): AST, ALT, ALKPHOS, BILITOT, PROT, ALBUMIN in the last 168 hours. No results for input(s): LIPASE, AMYLASE in the last 168 hours. No results for input(s): AMMONIA in the last 168 hours. Coagulation Profile: No results for input(s): INR, PROTIME in the last 168 hours. Cardiac Enzymes: No results for input(s): CKTOTAL, CKMB, CKMBINDEX, TROPONINI in the last 168 hours. BNP (last 3 results) No results for input(s): PROBNP in the last 8760 hours. HbA1C: No results for input(s): HGBA1C in the last 72 hours. CBG: No results for input(s): GLUCAP in the last 168 hours. Lipid Profile: No results for input(s): CHOL, HDL, LDLCALC, TRIG, CHOLHDL, LDLDIRECT in the last 72 hours. Thyroid Function Tests: No results for input(s): TSH, T4TOTAL, FREET4, T3FREE, THYROIDAB in the last 72 hours. Anemia Panel: No results for input(s): VITAMINB12, FOLATE, FERRITIN, TIBC, IRON, RETICCTPCT in the last 72 hours. Urine analysis:    Component Value Date/Time   COLORURINE COLORLESS (A) 04/03/2018 2319   APPEARANCEUR CLEAR 04/03/2018 2319   LABSPEC 1.006 04/03/2018 2319   PHURINE 8.0 04/03/2018 2319   GLUCOSEU >=500 (A) 04/03/2018 2319   HGBUR NEGATIVE 04/03/2018 2319   BILIRUBINUR NEGATIVE 04/03/2018 2319   KETONESUR NEGATIVE 04/03/2018 2319   PROTEINUR 100 (A) 04/03/2018 2319   UROBILINOGEN 0.2 09/19/2014 1620   NITRITE NEGATIVE 04/03/2018 2319   LEUKOCYTESUR NEGATIVE 04/03/2018 2319    Radiological Exams on  Admission: DG Chest 2 View  Result Date: 04/25/2019 CLINICAL DATA:  SVT EXAM: CHEST - 2 VIEW COMPARISON:  10/23/2018 FINDINGS: Right dialysis catheter in place with the tip in the right atrium. Heart is borderline in size. Bibasilar atelectasis. No effusions or edema. No acute bony abnormality. IMPRESSION: Bibasilar atelectasis. Electronically Signed   By: Rolm Baptise M.D.   On: 04/25/2019 00:33    EKG: Independently reviewed.  Sinus rhythm, QTC 490.  No acute ischemic changes.  Assessment/Plan Principal Problem:   Anemia Active Problems:   GI bleed   SVT (supraventricular tachycardia) (HCC)   Elevated troponin   ESRD on hemodialysis (HCC)   Acute on chronic symptomatic anemia, suspected related to occult GI bleed: Anemia also likely contributing to some of the patient's symptoms.  Hemoglobin 6.6, previously above 8.  Does have a history of prior GI bleed related to small bowel AVMs status post cauterization, friable mucosa in ascending colon, prior gastric ulcers, and diverticulosis.  Patient currently denies any symptoms of GI bleed.  Hemodynamically stable. -2 units PRBCs ordered.  Follow-up posttransfusion H&H.  Check FOBT.  Start IV PPI twice daily. Order anemia panel.  Keep n.p.o. and consult GI in a.m.  SVT: Converted to sinus rhythm after adenosine.  Potassium normal.  Magnesium level low and received supplementation in the ED.  No infectious signs or symptoms. ?PE although not tachycardic or hypoxic.  Plan is check D-dimer and TSH levels.  Continue cardiac monitoring and monitor electrolytes.  Elevated troponin: Suspect related to demand ischemia from SVT and anemia. High-sensitivity troponin 36 >60.  EKG without acute ischemic changes.  Currently appears comfortable.  Continue cardiac monitoring and trend troponin.  Continue home metoprolol.  ESRD on HD MWF: No indication for urgent dialysis at this time.  Continue home renal supplements and consult nephrology in a.m.  DVT  prophylaxis: No anticoagulation at this time.  Holding SCDs as D-dimer pending. Code Status: Full code Family Communication: No family available at this time. Disposition Plan: Anticipate discharge after work-up of anemia/suspected GI bleed. Admission status: It is  my clinical opinion that admission to INPATIENT is reasonable and necessary because of the expectation that this patient will require hospital care that crosses at least 2 midnights to treat this condition based on the medical complexity of the problems presented.  Given the aforementioned information, the predictability of an adverse outcome is felt to be significant.  The medical decision making on this patient was of high complexity and the patient is at high risk for clinical deterioration, therefore this is a level 3 visit.  Shela Leff MD [facility]  If 7PM-7AM, please contact night-coverage [email or web address]  04/25/2019, 4:47 AM

## 2019-04-26 LAB — RENAL FUNCTION PANEL
Albumin: 2.8 g/dL — ABNORMAL LOW (ref 3.5–5.0)
Anion gap: 13 (ref 5–15)
BUN: 42 mg/dL — ABNORMAL HIGH (ref 6–20)
CO2: 27 mmol/L (ref 22–32)
Calcium: 9.5 mg/dL (ref 8.9–10.3)
Chloride: 98 mmol/L (ref 98–111)
Creatinine, Ser: 12.18 mg/dL — ABNORMAL HIGH (ref 0.44–1.00)
GFR calc Af Amer: 4 mL/min — ABNORMAL LOW (ref >60)
GFR calc non Af Amer: 3 mL/min — ABNORMAL LOW (ref >60)
Glucose, Bld: 98 mg/dL (ref 70–99)
Phosphorus: 6.5 mg/dL — ABNORMAL HIGH (ref 2.5–4.6)
Potassium: 5.1 mmol/L (ref 3.5–5.1)
Sodium: 138 mmol/L (ref 135–145)

## 2019-04-26 LAB — TYPE AND SCREEN
ABO/RH(D): O NEG
Antibody Screen: NEGATIVE
Unit division: 0
Unit division: 0

## 2019-04-26 LAB — BPAM RBC
Blood Product Expiration Date: 202105092359
Blood Product Expiration Date: 202105182359
ISSUE DATE / TIME: 202104200357
ISSUE DATE / TIME: 202104200603
Unit Type and Rh: 9500
Unit Type and Rh: 9500

## 2019-04-26 LAB — CBC
HCT: 26.3 % — ABNORMAL LOW (ref 36.0–46.0)
Hemoglobin: 8.9 g/dL — ABNORMAL LOW (ref 12.0–15.0)
MCH: 29.4 pg (ref 26.0–34.0)
MCHC: 33.8 g/dL (ref 30.0–36.0)
MCV: 86.8 fL (ref 80.0–100.0)
Platelets: 189 10*3/uL (ref 150–400)
RBC: 3.03 MIL/uL — ABNORMAL LOW (ref 3.87–5.11)
RDW: 13.9 % (ref 11.5–15.5)
WBC: 8 10*3/uL (ref 4.0–10.5)
nRBC: 0 % (ref 0.0–0.2)

## 2019-04-26 LAB — MRSA PCR SCREENING: MRSA by PCR: NEGATIVE

## 2019-04-26 LAB — HEPATITIS B SURFACE ANTIGEN: Hepatitis B Surface Ag: NONREACTIVE

## 2019-04-26 LAB — TROPONIN I (HIGH SENSITIVITY)
Troponin I (High Sensitivity): 60 ng/L — ABNORMAL HIGH (ref <18)
Troponin I (High Sensitivity): 29 ng/L — ABNORMAL HIGH (ref <18)

## 2019-04-26 LAB — OCCULT BLOOD X 1 CARD TO LAB, STOOL: Fecal Occult Bld: NEGATIVE

## 2019-04-26 MED ORDER — HYDRALAZINE HCL 20 MG/ML IJ SOLN
10.0000 mg | Freq: Once | INTRAMUSCULAR | Status: AC
  Administered 2019-04-26: 10 mg via INTRAVENOUS

## 2019-04-26 MED ORDER — SODIUM CHLORIDE 0.9 % IV SOLN: 100.0000 mL | INTRAVENOUS | Status: DC | PRN

## 2019-04-26 MED ORDER — PENTAFLUOROPROP-TETRAFLUOROETH EX AERO: 1.0000 "application " | INHALATION_SPRAY | CUTANEOUS | Status: DC | PRN

## 2019-04-26 MED ORDER — DARBEPOETIN ALFA 100 MCG/0.5ML IJ SOSY
PREFILLED_SYRINGE | INTRAMUSCULAR | Status: AC
  Administered 2019-04-26: 100 ug via INTRAVENOUS
  Filled 2019-04-26: qty 0.5

## 2019-04-26 MED ORDER — ALTEPLASE 2 MG IJ SOLR: 2.0000 mg | Freq: Once | INTRAMUSCULAR | Status: DC | PRN

## 2019-04-26 MED ORDER — LIDOCAINE HCL (PF) 1 % IJ SOLN: 5.0000 mL | INTRAMUSCULAR | Status: DC | PRN

## 2019-04-26 MED ORDER — LIDOCAINE-PRILOCAINE 2.5-2.5 % EX CREA: 1.0000 "application " | TOPICAL_CREAM | CUTANEOUS | Status: DC | PRN

## 2019-04-26 MED ORDER — HEPARIN SODIUM (PORCINE) 1000 UNIT/ML IJ SOLN
INTRAMUSCULAR | Status: AC
  Administered 2019-04-26: 1000 [IU] via INTRAVENOUS_CENTRAL
  Filled 2019-04-26: qty 4

## 2019-04-26 MED ORDER — HYDRALAZINE HCL 25 MG PO TABS
25.0000 mg | ORAL_TABLET | Freq: Three times a day (TID) | ORAL | Status: DC
  Administered 2019-04-26 – 2019-04-28 (×6): 25 mg via ORAL
  Filled 2019-04-26 (×6): qty 1

## 2019-04-26 MED ORDER — ONDANSETRON HCL 4 MG/2ML IJ SOLN
4.0000 mg | Freq: Four times a day (QID) | INTRAMUSCULAR | Status: DC | PRN
  Administered 2019-04-26: 4 mg via INTRAVENOUS
  Filled 2019-04-26: qty 2

## 2019-04-26 MED ORDER — OXYCODONE HCL 5 MG PO TABS
2.5000 mg | ORAL_TABLET | Freq: Once | ORAL | Status: AC | PRN
  Administered 2019-04-26: 15:00:00 2.5 mg via ORAL
  Filled 2019-04-26: qty 1

## 2019-04-26 MED ORDER — BUTALBITAL-APAP-CAFFEINE 50-325-40 MG PO TABS: 1.0000 | ORAL_TABLET | Freq: Once | ORAL | Status: DC | PRN

## 2019-04-26 MED ORDER — HYDRALAZINE HCL 20 MG/ML IJ SOLN
INTRAMUSCULAR | Status: AC
  Administered 2019-04-26: 10 mg via INTRAVENOUS
  Filled 2019-04-26: qty 1

## 2019-04-26 MED ORDER — HEPARIN SODIUM (PORCINE) 1000 UNIT/ML DIALYSIS: 1000.0000 [IU] | INTRAMUSCULAR | Status: DC | PRN

## 2019-04-26 MED ORDER — ACETAMINOPHEN 325 MG PO TABS
ORAL_TABLET | ORAL | Status: AC
  Administered 2019-04-26: 650 mg via ORAL
  Filled 2019-04-26: qty 2

## 2019-04-26 NOTE — Progress Notes (Signed)
PROGRESS NOTE    Patricia [name]  ZOX:[id number] DOB: [birth year] DOA: 04/24/2019 PCP: Blair Heys, PA-C   Chief Complaint  Patient presents with  . Tachycardia    Brief Narrative:  44 year old female with history of PAT on metoprolol, ESRD on HD MWF, GIB/IDA, CVA and anxiety presenting with chest pain, shortness of breath and palpitation and found to be anemic with Hgb down to 6.6 (previously 8.0).  Reportedly had SVT when EMS arrived but converted to NSR with adenosine.  HR has been within normal range since then.  Has mild HS trop (36> 60> 90).  EKG without acute ischemic finding.  Received 2 units of blood.  Assessment & Plan:   Principal Problem:   Anemia Active Problems:   GI bleed   SVT (supraventricular tachycardia) (HCC)   Elevated troponin   ESRD on hemodialysis (HCC)  Symptomatic anemia- transfused 2 units with appropriate response.  Patient denies melena or hematochezia.  FOBT negative. - Capsule endoscopy 05/2018 with nonbleeding AVM's -> plan for DBE at [facility] at that time -> notable for 2 angioectasias with no bleeding in the mid jejunum and distal jejunum, treated with APC -> recommended considering medical therapy with octreotide if anemia progresses -Iron infusion and ESA per nephrology -labs suggestive of AOCD -> normal iron, elevated ferritin, elevated b12, normal folate - consider discussion with GI -> pt noticed dark stool today, will discuss with nurse let me know if this is recurrent  Chest pain/dyspnea/elevated troponin-suspect this to be demand ischemia in the setting of anemia, SVT and uncontrolled hypertension.  Troponin elevation could be Keiara Sneeringer combination of delayed clearance due to ESRD and demand ischemia as above. -Optimize blood pressure control -follow repeat troponin -follow echo  Uncontrolled hypertension: SBP in the 200's this AM with dialysis, improved with PO and prn meds. -Increase home metoprolol tartrate to 50 mg twice daily, amlodipine 10 mg  daily, hydralazine added by nephrology -she's also apparently on lisinopril at home, will follow with recent addition of hydral and add this back as needed -As needed labetalol with parameters  ESRD on HD MWF -Nephrology consulted  Headache: Could be due to hypertension.  No focal neuro deficit or red flags. -As needed Tylenol -Optimize BP control   PAT/SVT-HR within normal range after adenosine by EMS. -Beta-blockers as above   DVT prophylaxis: heparin Code Status: full Family Communication: none at bedside Disposition:   Status is: Inpatient  Remains inpatient appropriate because:IV treatments appropriate due to intensity of illness or inability to take PO, Inpatient level of care appropriate due to severity of illness and significantly elevated BP's requiring IV meds and adjustment of PO meds   Dispo: The patient is from: Home              Anticipated d/c is to: Home              Anticipated d/c date is: 1 day              Patient currently is not medically stable to d/c.  Consultants:   renal  Procedures:   none  Antimicrobials: Anti-infectives (From admission, onward)   None      Subjective: C/o headache  Objective: Vitals:   04/26/19 1000 04/26/19 1030 04/26/19 1053 04/26/19 1144  BP: (!) 173/96 (!) 173/89 (!) 179/92 (!) 160/91  Pulse: 71 68 71 76  Resp:   18 20  Temp:   98.3 F (36.8 C) 98.3 F (36.8 C)  TempSrc:   Oral Oral  SpO2:   98% 100%  Weight:   112.2 kg   Height:        Intake/Output Summary (Last 24 hours) at 04/26/2019 1353 Last data filed at 04/26/2019 1053 Gross per 24 hour  Intake 360 ml  Output 3500 ml  Net -3140 ml   Filed Weights   04/26/19 0437 04/26/19 0720 04/26/19 1053  Weight: 112.4 kg 115 kg 112.2 kg    Examination:  General exam: Appears calm and comfortable  Respiratory system: Clear to auscultation. Respiratory effort normal. Cardiovascular system: S1 & S2 heard, RRR.  Gastrointestinal system: Abdomen is  nondistended, soft and nontender.  Central nervous system: Alert and oriented. No focal neurological deficits. Extremities: no LEE Skin: No rashes, lesions or ulcers Psychiatry: Judgement and insight appear normal. Mood & affect appropriate.     Data Reviewed: I have personally reviewed following labs and imaging studies  CBC: Recent Labs  Lab 04/25/19 0037 04/25/19 1225 04/25/19 1818 04/26/19 0538  WBC 7.0  --  8.8 8.0  HGB 6.6* 7.8* 8.5* 8.9*  HCT 22.2* 25.4* 26.1* 26.3*  MCV 92.9  --  87.9 86.8  PLT 232  --  227 161    Basic Metabolic Panel: Recent Labs  Lab 04/25/19 0037 04/25/19 0151 04/25/19 1225 04/25/19 1730 04/26/19 0740  NA 142  --  141 141 138  K 3.9  --  5.3* 5.4* 5.1  CL 101  --  100 101 98  CO2 33*  --  $R'23 27 27  'Rt$ GLUCOSE 130*  --  103* 102* 98  BUN 26*  --  35* 35* 42*  CREATININE 8.87*  --  10.47* 10.49* 12.18*  CALCIUM 8.4*  --  9.3 9.2 9.5  MG 1.5*  --  2.1  --   --   PHOS  --  4.6  --  5.8* 6.5*    GFR: Estimated Creatinine Clearance: 7.5 mL/min (Maanav Kassabian) (by C-G formula based on SCr of 12.18 mg/dL (H)).  Liver Function Tests: Recent Labs  Lab 04/25/19 1730 04/26/19 0740  ALBUMIN 2.7* 2.8*    CBG: No results for input(s): GLUCAP in the last 168 hours.   Recent Results (from the past 240 hour(s))  SARS CORONAVIRUS 2 (TAT 6-24 HRS) Nasopharyngeal Nasopharyngeal Swab     Status: None   Collection Time: 04/25/19  2:55 AM   Specimen: Nasopharyngeal Swab  Result Value Ref Range Status   SARS Coronavirus 2 NEGATIVE NEGATIVE Final    Comment: (NOTE) SARS-CoV-2 target nucleic acids are NOT DETECTED. The SARS-CoV-2 RNA is generally detectable in upper and lower respiratory specimens during the acute phase of infection. Negative results do not preclude SARS-CoV-2 infection, do not rule out co-infections with other pathogens, and should not be used as the sole basis for treatment or other patient management decisions. Negative results must be  combined with clinical observations, patient history, and epidemiological information. The expected result is Negative. Fact Sheet for Patients: SugarRoll.be Fact Sheet for Healthcare Providers: https://www.woods-mathews.com/ This test is not yet approved or cleared by the [place] FDA and  has been authorized for detection and/or diagnosis of SARS-CoV-2 by FDA under an Emergency Use Authorization (EUA). This EUA will remain  in effect (meaning this test can be used) for the duration of the COVID-19 declaration under Section 56 4(b)(1) of the Act, 21 U.S.C. section 360bbb-3(b)(1), unless the authorization is terminated or revoked sooner. Performed at [facility], [place] [address], [place], [place] [address]   MRSA PCR  Screening     Status: None   Collection Time: 04/25/19  6:01 AM   Specimen: Nasal Mucosa; Nasopharyngeal  Result Value Ref Range Status   MRSA by PCR NEGATIVE NEGATIVE Final    Comment:        The GeneXpert MRSA Assay (FDA approved for NASAL specimens only), is one component of Ifeoluwa Beller comprehensive MRSA colonization surveillance program. It is not intended to diagnose MRSA infection nor to guide or monitor treatment for MRSA infections. Performed at [facility], [place] [address], [place], [place] [address]          Radiology Studies: DG Chest 2 View  Result Date: 04/25/2019 CLINICAL DATA:  SVT EXAM: CHEST - 2 VIEW COMPARISON:  10/23/2018 FINDINGS: Right dialysis catheter in place with the tip in the right atrium. Heart is borderline in size. Bibasilar atelectasis. No effusions or edema. No acute bony abnormality. IMPRESSION: Bibasilar atelectasis. Electronically Signed   By: Rolm Baptise M.D.   On: 04/25/2019 00:33   DG Chest Port 1 View  Result Date: 04/25/2019 CLINICAL DATA:  Shortness of breath. EXAM: PORTABLE CHEST 1 VIEW COMPARISON:  Chest x-ray from same day at 00:12 hours. FINDINGS: Unchanged  tunneled right internal jugular dialysis catheter. The heart size and mediastinal contours are within normal limits. Normal pulmonary vascularity. No focal consolidation, pleural effusion, or pneumothorax. No acute osseous abnormality. IMPRESSION: No active disease. Electronically Signed   By: Titus Dubin M.D.   On: 04/25/2019 14:30        Scheduled Meds: . allopurinol  100 mg Oral QHS  . amLODipine  10 mg Oral Daily  . calcitRIOL  0.5 mcg Oral Q breakfast  . Chlorhexidine Gluconate Cloth  6 each Topical Q0600  . darbepoetin (ARANESP) injection - DIALYSIS  100 mcg Intravenous Q Wed-HD  . ferric citrate  630 mg Oral TID WC  . gabapentin  100 mg Oral Daily  . heparin injection (subcutaneous)  5,000 Units Subcutaneous Q8H  . hydrALAZINE  25 mg Oral Q8H  . metoprolol tartrate  50 mg Oral BID  . multivitamin  1 tablet Oral QHS  . pantoprazole (PROTONIX) IV  40 mg Intravenous Q12H  . pravastatin  20 mg Oral QHS  . sodium chloride flush  3 mL Intravenous Once   Continuous Infusions: . sodium chloride       LOS: 1 day    Time spent: over 30 min    Fayrene Helper, MD [facility]   To contact the attending provider between 7A-7P or the covering provider during after hours 7P-7A, please log into the web site [email or web address] and access using universal [place] password for that web site. If you do not have the password, please call the hospital operator.  04/26/2019, 1:53 PM

## 2019-04-26 NOTE — Plan of Care (Signed)

## 2019-04-26 NOTE — Progress Notes (Signed)
New order placed for incentive spirometry. Set up incentive spirometer and provided it to patient. Patient stated she was familiar with the device. Requested patient provide demonstration. Corrected patient's technique and encouraged no fewer than ten uses every two hour minimum. Will continue to monitor.

## 2019-04-26 NOTE — Progress Notes (Signed)
[facility] Progress Note   Subjective: Seen on HD extremely hypertensive with SOB. UFG increased, started on O2.     Objective Vitals:   04/26/19 0720 04/26/19 0725 04/26/19 0730 04/26/19 0800  BP: (!) 199/77 (!) 213/97 (!) 188/106 (!) 209/111  Pulse: 63 71 65 69  Resp: 18     Temp: 98.3 F (36.8 C)     TempSrc: Oral     SpO2: 97%     Weight: 115 kg     Height:       Physical Exam General: Obese female in NAD Heart: S1,S2 SR 60s Lungs: CTAB sl decreased in bases Abdomen: obese, NT Extremities: Trace ankle edema Dialysis Access: RIJ TDC blood lines connected   Additional Objective Labs: Basic Metabolic Panel: Recent Labs  Lab 04/25/19 0037 04/25/19 0151 04/25/19 1225 04/25/19 1730 04/26/19 0740  NA   < >  --  141 141 138  K   < >  --  5.3* 5.4* 5.1  CL   < >  --  100 101 98  CO2   < >  --  23 27 27   GLUCOSE   < >  --  103* 102* 98  BUN   < >  --  35* 35* 42*  CREATININE   < >  --  10.47* 10.49* 12.18*  CALCIUM   < >  --  9.3 9.2 9.5  PHOS  --  4.6  --  5.8* 6.5*   < > = values in this interval not displayed.   Liver Function Tests: Recent Labs  Lab 04/25/19 1730 04/26/19 0740  ALBUMIN 2.7* 2.8*   No results for input(s): LIPASE, AMYLASE in the last 168 hours. CBC: Recent Labs  Lab 04/25/19 0037 04/25/19 0037 04/25/19 1225 04/25/19 1818 04/26/19 0538  WBC 7.0  --   --  8.8 8.0  HGB 6.6*   < > 7.8* 8.5* 8.9*  HCT 22.2*   < > 25.4* 26.1* 26.3*  MCV 92.9  --   --  87.9 86.8  PLT 232  --   --  227 189   < > = values in this interval not displayed.   Blood Culture    Component Value Date/Time   SDES BLOOD RIGHT HAND 08/23/2018 2120   SPECREQUEST  08/23/2018 2120    BOTTLES DRAWN AEROBIC ONLY Blood Culture adequate volume   CULT  08/23/2018 2120    NO GROWTH 5 DAYS Performed at [facility], [address], [place], [place]    REPTSTATUS 08/28/2018 FINAL 08/23/2018 2120    Cardiac Enzymes: No results for  input(s): CKTOTAL, CKMB, CKMBINDEX, TROPONINI in the last 168 hours. CBG: No results for input(s): GLUCAP in the last 168 hours. Iron Studies:  Recent Labs    04/25/19 1222  IRON 66  TIBC 223*  FERRITIN 1,151*   @lablastinr3 @ Studies/Results: DG Chest 2 View  Result Date: 04/25/2019 CLINICAL DATA:  SVT EXAM: CHEST - 2 VIEW COMPARISON:  10/23/2018 FINDINGS: Right dialysis catheter in place with the tip in the right atrium. Heart is borderline in size. Bibasilar atelectasis. No effusions or edema. No acute bony abnormality. IMPRESSION: Bibasilar atelectasis. Electronically Signed   By: 04/27/2019 M.D.   On: 04/25/2019 00:33   DG Chest Port 1 View  Result Date: 04/25/2019 CLINICAL DATA:  Shortness of breath. EXAM: PORTABLE CHEST 1 VIEW COMPARISON:  Chest x-ray from same day at 00:12 hours. FINDINGS: Unchanged tunneled right internal jugular dialysis  catheter. The heart size and mediastinal contours are within normal limits. Normal pulmonary vascularity. No focal consolidation, pleural effusion, or pneumothorax. No acute osseous abnormality. IMPRESSION: No active disease. Electronically Signed   By: Titus Dubin M.D.   On: 04/25/2019 14:30   Medications: . sodium chloride    . sodium chloride    . sodium chloride     . allopurinol  100 mg Oral QHS  . amLODipine  10 mg Oral Daily  . calcitRIOL  0.5 mcg Oral Q breakfast  . Chlorhexidine Gluconate Cloth  6 each Topical Q0600  . darbepoetin (ARANESP) injection - DIALYSIS  100 mcg Intravenous Q Wed-HD  . ferric citrate  630 mg Oral TID WC  . gabapentin  100 mg Oral Daily  . heparin injection (subcutaneous)  5,000 Units Subcutaneous Q8H  . metoprolol tartrate  50 mg Oral BID  . multivitamin  1 tablet Oral QHS  . pantoprazole (PROTONIX) IV  40 mg Intravenous Q12H  . pravastatin  20 mg Oral QHS  . sodium chloride flush  3 mL Intravenous Once     Dialysis Orders: [facility] M[facility] 4 hrs 180NRe 400/800 111.5 kg 2.0 K/2.0 Ca RIJ TDC -Heparin  5000 units IV TIW -No ESA/Venofer ordered (Last HGB 7.0 04/17/19 Tsat 41 03/13/19) -No incenter VDRA (still taking Calcitriol 1 mcg PO daily from PD orders)  Assessment/Plan: 1.  Symptomatic Anemia-HGB 7.0 04/19/2019 at OP center. FOBT negative. She takes Auryxia binders so will have dark stools. No recent OP ESA. Has rec'd 2 units of PRBCs. HGB 8.9.  2. SVT-converted to SR with adenosine. Per primary. Has been followed as OP by Dr. Sallyanne Kuster. Metoprolol increased per primary.  3.  Elevated troponin in setting of ESRD-per primary.  4.  ESRD - MWF Today 2.0 K bath. Hold heparin until W/U for GIB completed per primary.  5.  Hypertension/volume-No overt evidence of volume overload but SOB after receiving 2 units of PRBCS. BP elevated. EDW recently lowered. She has been started on amlodipine. Increase UFG today. SBP > 200. Give hydralazine 10 mg IV now. Start hydralazine 25 mg PO TID.  6.  Anemia  - As noted in #1. Give Aranesp 100 mcg IV with HD tomorrow. Tsat 30% 7.  Metabolic bone disease -  Continue daily oral calcitriol, binders.  8.  Nutrition - On clear liquids. Renal diet when able to eat, renal vits, nepro  Adem Costlow H. Paris Hohn NP-C 04/26/2019, 8:46 AM  [employer] [phone]

## 2019-04-27 ENCOUNTER — Encounter (HOSPITAL_COMMUNITY): Payer: Self-pay | Admitting: Internal Medicine

## 2019-04-27 ENCOUNTER — Other Ambulatory Visit: Payer: Self-pay

## 2019-04-27 ENCOUNTER — Inpatient Hospital Stay (HOSPITAL_COMMUNITY): Payer: Medicare Other

## 2019-04-27 DIAGNOSIS — R079 Chest pain, unspecified: Secondary | ICD-10-CM

## 2019-04-27 DIAGNOSIS — R0602 Shortness of breath: Secondary | ICD-10-CM

## 2019-04-27 LAB — GLUCOSE, CAPILLARY: Glucose-Capillary: 97 mg/dL (ref 70–99)

## 2019-04-27 LAB — COMPREHENSIVE METABOLIC PANEL
ALT: 9 U/L (ref 0–44)
AST: 19 U/L (ref 15–41)
Albumin: 2.8 g/dL — ABNORMAL LOW (ref 3.5–5.0)
Alkaline Phosphatase: 40 U/L (ref 38–126)
Anion gap: 14 (ref 5–15)
BUN: 26 mg/dL — ABNORMAL HIGH (ref 6–20)
CO2: 26 mmol/L (ref 22–32)
Calcium: 9.7 mg/dL (ref 8.9–10.3)
Chloride: 97 mmol/L — ABNORMAL LOW (ref 98–111)
Creatinine, Ser: 9.22 mg/dL — ABNORMAL HIGH (ref 0.44–1.00)
GFR calc Af Amer: 5 mL/min — ABNORMAL LOW (ref >60)
GFR calc non Af Amer: 5 mL/min — ABNORMAL LOW (ref >60)
Glucose, Bld: 105 mg/dL — ABNORMAL HIGH (ref 70–99)
Potassium: 4.5 mmol/L (ref 3.5–5.1)
Sodium: 137 mmol/L (ref 135–145)
Total Bilirubin: 0.3 mg/dL (ref 0.3–1.2)
Total Protein: 6.5 g/dL (ref 6.5–8.1)

## 2019-04-27 LAB — CBC WITH DIFFERENTIAL/PLATELET
Abs Immature Granulocytes: 0.04 10*3/uL (ref 0.00–0.07)
Basophils Absolute: 0.1 10*3/uL (ref 0.0–0.1)
Basophils Relative: 1 %
Eosinophils Absolute: 0.2 10*3/uL (ref 0.0–0.5)
Eosinophils Relative: 3 %
HCT: 28.2 % — ABNORMAL LOW (ref 36.0–46.0)
Hemoglobin: 9.2 g/dL — ABNORMAL LOW (ref 12.0–15.0)
Immature Granulocytes: 1 %
Lymphocytes Relative: 25 %
Lymphs Abs: 1.7 10*3/uL (ref 0.7–4.0)
MCH: 28.6 pg (ref 26.0–34.0)
MCHC: 32.6 g/dL (ref 30.0–36.0)
MCV: 87.6 fL (ref 80.0–100.0)
Monocytes Absolute: 0.6 10*3/uL (ref 0.1–1.0)
Monocytes Relative: 8 %
Neutro Abs: 4.2 10*3/uL (ref 1.7–7.7)
Neutrophils Relative %: 62 %
Platelets: 230 10*3/uL (ref 150–400)
RBC: 3.22 MIL/uL — ABNORMAL LOW (ref 3.87–5.11)
RDW: 13.5 % (ref 11.5–15.5)
WBC: 6.7 10*3/uL (ref 4.0–10.5)
nRBC: 0 % (ref 0.0–0.2)

## 2019-04-27 LAB — MAGNESIUM: Magnesium: 1.7 mg/dL (ref 1.7–2.4)

## 2019-04-27 LAB — ECHOCARDIOGRAM COMPLETE
Height: 67 in
Weight: 3836.8 oz

## 2019-04-27 LAB — PHOSPHORUS: Phosphorus: 6.7 mg/dL — ABNORMAL HIGH (ref 2.5–4.6)

## 2019-04-27 MED ORDER — METOPROLOL TARTRATE 25 MG PO TABS: 50.0000 mg | ORAL_TABLET | Freq: Every day | ORAL | 0 refills | Status: DC

## 2019-04-27 MED ORDER — FERRIC CITRATE 1 GM 210 MG(FE) PO TABS: 630.0000 mg | ORAL_TABLET | Freq: Three times a day (TID) | ORAL | Status: DC

## 2019-04-27 MED ORDER — SUMATRIPTAN SUCCINATE 50 MG PO TABS
50.0000 mg | ORAL_TABLET | ORAL | Status: AC | PRN
  Administered 2019-04-27 – 2019-04-28 (×2): 50 mg via ORAL
  Filled 2019-04-27 (×3): qty 1

## 2019-04-27 MED ORDER — AMLODIPINE BESYLATE 10 MG PO TABS: 10.0000 mg | ORAL_TABLET | Freq: Every day | ORAL | 0 refills | Status: DC

## 2019-04-27 MED ORDER — HYDRALAZINE HCL 25 MG PO TABS: 25.0000 mg | ORAL_TABLET | Freq: Three times a day (TID) | ORAL | 0 refills | Status: DC

## 2019-04-27 MED ORDER — LISINOPRIL 20 MG PO TABS: 20.0000 mg | ORAL_TABLET | Freq: Every day | ORAL | 0 refills | Status: DC

## 2019-04-27 MED ORDER — LISINOPRIL 20 MG PO TABS
20.0000 mg | ORAL_TABLET | Freq: Every day | ORAL | Status: DC
  Administered 2019-04-27 – 2019-04-28 (×2): 20 mg via ORAL
  Filled 2019-04-27 (×2): qty 1

## 2019-04-27 MED ORDER — POLYVINYL ALCOHOL 1.4 % OP SOLN
1.0000 [drp] | OPHTHALMIC | Status: DC | PRN
  Administered 2019-04-27: 1 [drp] via OPHTHALMIC
  Filled 2019-04-27: qty 15

## 2019-04-27 MED ORDER — BUTALBITAL-APAP-CAFFEINE 50-325-40 MG PO TABS
1.0000 | ORAL_TABLET | Freq: Once | ORAL | Status: AC
  Administered 2019-04-27: 18:00:00 1 via ORAL
  Filled 2019-04-27: qty 1

## 2019-04-27 NOTE — Progress Notes (Signed)
 [facility] Progress Note   Subjective: seen sitting in totally dark room. Still has HA but feeling better than yesterday. BP better controlled but still high. Now on 3 antihypertensive meds plus metoprolol. No further SVT reported. HD tomorrow.   Objective Vitals:   04/27/19 0100 04/27/19 0340 04/27/19 0920 04/27/19 1157  BP: (!) 162/95 (!) 152/72 (!) 176/81 (!) 168/96  Pulse:  74 73 71  Resp:  $Remo'18 18 19  'zjqAM$ Temp:  99 F (37.2 C) 98.8 F (37.1 C) 99.6 F (37.6 C)  TempSrc:  Oral Oral Oral  SpO2:  96% 95% 97%  Weight:  108.8 kg    Height:       Physical Exam General: Obese female in NAD Heart: S1,S2 SR 60s Lungs: CTAB sl decreased in bases Abdomen: obese, NT Extremities: Trace ankle edema Dialysis Access: RIJ [facility] CDI   Additional Objective Labs: Basic Metabolic Panel: Recent Labs  Lab 04/25/19 1730 04/26/19 0740 04/27/19 0820  NA 141 138 137  K 5.4* 5.1 4.5  CL 101 98 97*  CO2 $Re'27 27 26  'cMC$ GLUCOSE 102* 98 105*  BUN 35* 42* 26*  CREATININE 10.49* 12.18* 9.22*  CALCIUM 9.2 9.5 9.7  PHOS 5.8* 6.5* 6.7*   Liver Function Tests: Recent Labs  Lab 04/25/19 1730 04/26/19 0740 04/27/19 0820  AST  --   --  19  ALT  --   --  9  ALKPHOS  --   --  40  BILITOT  --   --  0.3  PROT  --   --  6.5  ALBUMIN 2.7* 2.8* 2.8*   No results for input(s): LIPASE, AMYLASE in the last 168 hours. CBC: Recent Labs  Lab 04/25/19 0037 04/25/19 1225 04/25/19 1818 04/26/19 0538 04/27/19 0820  WBC 7.0  --  8.8 8.0 6.7  NEUTROABS  --   --   --   --  4.2  HGB 6.6*   < > 8.5* 8.9* 9.2*  HCT 22.2*   < > 26.1* 26.3* 28.2*  MCV 92.9  --  87.9 86.8 87.6  PLT 232  --  227 189 230   < > = values in this interval not displayed.   Blood Culture    Component Value Date/Time   SDES BLOOD RIGHT HAND 08/23/2018 2120   SPECREQUEST  08/23/2018 2120    BOTTLES DRAWN AEROBIC ONLY Blood Culture adequate volume   CULT  08/23/2018 2120    NO GROWTH 5 DAYS Performed at [facility], [place] [address] [place], [place] [address]    REPTSTATUS 08/28/2018 FINAL 08/23/2018 2120    Cardiac Enzymes: No results for input(s): CKTOTAL, CKMB, CKMBINDEX, TROPONINI in the last 168 hours. CBG: Recent Labs  Lab 04/27/19 0928  GLUCAP 97   Iron Studies:  Recent Labs    04/25/19 1222  IRON 66  TIBC 223*  FERRITIN 1,151*   '@lablastinr3'$ @ Studies/Results: DG Chest Port 1 View  Result Date: 04/25/2019 CLINICAL DATA:  Shortness of breath. EXAM: PORTABLE CHEST 1 VIEW COMPARISON:  Chest x-ray from same day at 00:12 hours. FINDINGS: Unchanged tunneled right internal jugular dialysis catheter. The heart size and mediastinal contours are within normal limits. Normal pulmonary vascularity. No focal consolidation, pleural effusion, or pneumothorax. No acute osseous abnormality. IMPRESSION: No active disease. Electronically Signed   By: Titus Dubin M.D.   On: 04/25/2019 14:30   Medications: . sodium chloride     . allopurinol  100 mg Oral QHS  .  amLODipine  10 mg Oral Daily  . calcitRIOL  0.5 mcg Oral Q breakfast  . Chlorhexidine Gluconate Cloth  6 each Topical Q0600  . darbepoetin (ARANESP) injection - DIALYSIS  100 mcg Intravenous Q Wed-HD  . ferric citrate  630 mg Oral TID WC  . gabapentin  100 mg Oral Daily  . heparin injection (subcutaneous)  5,000 Units Subcutaneous Q8H  . hydrALAZINE  25 mg Oral Q8H  . lisinopril  20 mg Oral Daily  . metoprolol tartrate  50 mg Oral BID  . multivitamin  1 tablet Oral QHS  . pantoprazole (PROTONIX) IV  40 mg Intravenous Q12H  . pravastatin  20 mg Oral QHS  . sodium chloride flush  3 mL Intravenous Once     Dialysis Orders:[facility] MWF 4 hrs 180NRe 400/800 111.5 kg 2.0 K/2.0 Ca RIJ TDC -Heparin 5000 units IV TIW -No ESA/Venofer ordered (Last HGB 7.0 04/17/19 Tsat 41 03/13/19) -No incenter VDRA (still taking Calcitriol 1 mcg PO daily from PD orders)  Assessment/Plan: 1. Symptomatic Anemia-HGB 7.0 04/19/2019 at OP  center.FOBT negative. She takes Auryxia binders so will have dark stools. No recent OP ESA. Has rec'd 2 units of PRBCs.HGB 9.2.  2. SVT-converted to SR with adenosine. Per primary. Has been followed as OP by Dr. Sallyanne Kuster. Metoprolol increased per primary. Metoprolol increased per primary.  3. Elevated troponin in setting of ESRD-per primary.  4. ESRD - MWF HD 04/23 on schedule. Hold heparin until W/U for GIB completed per primary. 5. Hypertension/volume-No overt evidence of volume overload but SOB after receiving 2 units of PRBCS SOB resolved with HD. BP still elevated. EDW recently lowered. Net UF with HD 3.5 L 04/22 Post wt 112.2 kg. Now on amlodipine, hydralazine, lisinopril. Continue lowering volume as tolerated.  6. Anemia - As noted in #1. Given Aranesp 100 mcg IV with HD 04/26/19. Tsat 30% 7. Metabolic bone disease - Continue daily oral calcitriol, binders. 8. Nutrition - On clear liquids. Renal diet when able to eat, renal vits, nepro  Nyah Shepherd H. Felishia Wartman NP-C 04/27/2019, 12:48 PM  [employer] [phone]

## 2019-04-27 NOTE — Discharge Summary (Signed)
Physician Discharge Summary  Patricia [name] QPY:[id number] DOB: 1975-05-24 DOA: 04/24/2019  PCP: Blair Heys, PA-C  Admit date: 04/24/2019 Discharge date: 04/28/2019  Time spent: 40 minutes  Recommendations for Outpatient Follow-up:  1. Follow outpatient CBC/CMP 2. Follow blood pressure outpatient - discharged with amlodipine and hydral in addition to metoprolol (increased dose) and lisinopril 3. Follow up with cardiology outpatient for SVT and mildly elevated troponin   Discharge Diagnoses:  Principal Problem:   Anemia Active Problems:   GI bleed   SVT (supraventricular tachycardia) (HCC)   Elevated troponin   ESRD on hemodialysis [facility] : [facility])   Discharge Condition: stable  Diet recommendation: renal  Filed Weights   04/28/19 0445 04/28/19 0650 04/28/19 1105  Weight: 109.7 kg 110 kg 105.4 kg    History of present illness:  44 year old female with history of PAT on metoprolol, ESRD on HD MWF, GIB/IDA, CVA and anxiety presenting with chest pain, shortness of breath and palpitation and found to be anemic with Hgb down to 6.6 (previously 8.0). Reportedly had SVT when EMS arrived but converted to NSR with adenosine. HR has been within normal range since then. Has mild HS trop(36>60>90).EKG without acute ischemic finding. Received 2 units of blood.  She was admitted with anemia.  She's improved after transfusion.  FOBT negative.  Hospitalization was c/b SVT with EMS and severely elevated BP's during hospitalization.  She's improved with dialysis and adjustment of her medications.  Plan for outpatient follow up.  Discharged on 4/23 with improved sx.  Hospital Course:  Symptomatic anemia- transfused 2 units with appropriate response. Patient denies melena or hematochezia. FOBT negative. 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 Troponin elevation could be [place] combination of delayed clearance due to ESRD and demand ischemia as above. -Optimize blood pressure control -follow repeat troponin (downtrending) -follow echo -> EF 12-45%, grade 1 diastolic dysfunction (see report) -follow with cards outpatient   Uncontrolled hypertension: improving SBP -Increase home metoprolol tartrate to 50 mg twice daily, amlodipine 10 mg daily, increase hydralazine 50 mg TID, lisinopril 20 mg daily -improved with dialysis and BP adjustment today -follow outpatient with dialysis   ESRD on HD MWF -Nephrology consulted  Headache: Could be due to hypertension, persistent. No focal neuro deficit or red flags. - improved, follow outpatient   PAT/SVT-HR within normal range after adenosine by EMS. -Beta-blockers as above - follow with cards outpatient   Procedures: Echo IMPRESSIONS    1. Left ventricular ejection fraction, by estimation, is 55 to 60%. The  left ventricle has normal function. The left ventricle has no regional  wall motion abnormalities. Left ventricular diastolic parameters are  consistent with Grade I diastolic  dysfunction (impaired relaxation).  2. Right ventricular systolic function is normal. The right ventricular  size is normal. Tricuspid regurgitation signal is inadequate for assessing  PA pressure.  3. The mitral valve is normal in structure. Trivial mitral valve  regurgitation. No evidence of mitral stenosis.  4. The aortic valve is tricuspid. Aortic  valve  regurgitation is not  visualized. No aortic stenosis is present.  5. The inferior vena cava is normal in size with greater than 50%  respiratory variability, suggesting right atrial pressure of 3 mmHg.   FINDINGS  Left Ventricle: Left ventricular ejection fraction, by estimation, is 55  to 60%. The left ventricle has normal function. The left ventricle has no  regional wall motion abnormalities. The left ventricular internal cavity  size was normal in size. There is  no left ventricular hypertrophy. Left ventricular diastolic parameters  are consistent with Grade I diastolic dysfunction (impaired relaxation).  Consultations:  renal  Discharge Exam: Vitals:   04/28/19 1155 04/28/19 1421  BP: (!) 143/88 (!) 154/101  Pulse: 71 70  Resp:    Temp: 99.4 F (37.4 C)   SpO2: 100% 99%   Feels better after dialysis Ready for discharge  General: No acute distress. Cardiovascular: Heart sounds show Nima Kemppainen regular rate, and rhythm Lungs: Clear to auscultation bilaterally  Abdomen: Soft, nontender, nondistended Neurological: Alert and oriented 3. Moves all extremities 4 . Cranial nerves II through XII grossly intact. Skin: Warm and dry. No rashes or lesions. Extremities: No clubbing or cyanosis. No edema. Discharge Instructions   Discharge Instructions    Call MD for:  difficulty breathing, headache or visual disturbances   Complete by: As directed    Call MD for:  difficulty breathing, headache or visual disturbances   Complete by: As directed    Call MD for:  extreme fatigue   Complete by: As directed    Call MD for:  extreme fatigue   Complete by: As directed    Call MD for:  hives   Complete by: As directed    Call MD for:  hives   Complete by: As directed    Call MD for:  persistant dizziness or light-headedness   Complete by: As directed    Call MD for:  persistant dizziness or light-headedness   Complete by: As directed    Call MD for:  persistant nausea and vomiting    Complete by: As directed    Call MD for:  persistant nausea and vomiting   Complete by: As directed    Call MD for:  redness, tenderness, or signs of infection (pain, swelling, redness, odor or green/yellow discharge around incision site)   Complete by: As directed    Call MD for:  redness, tenderness, or signs of infection (pain, swelling, redness, odor or green/yellow discharge around incision site)   Complete by: As directed    Call MD for:  severe uncontrolled pain   Complete by: As directed    Call MD for:  severe uncontrolled pain   Complete by: As directed    Call MD for:  temperature >100.4   Complete by: As directed    Call MD for:  temperature >100.4   Complete by: As directed    Diet - low sodium heart healthy   Complete by: As directed    Diet - low sodium heart healthy   Complete by: As directed    Diet - low sodium heart healthy   Complete by: As directed    Discharge instructions   Complete by: As directed    You were admitted with anemia.    You've improved after transfusion.  Please follow up with your GI doctors outpatient and follow with your PCP as well for repeat blood counts within 1 week.  Your blood pressure was significantly elevated.  We've added  new blood pressure medications (amlodipine and hydralazine).  Continue this in addition to your metoprolol and lisinopril.  Your cardiac enzymes were slightly elevated.  Your ultrasound of your heart was reassuring.  I think your elevated heart enzymes were related to your significantly elevated blood pressures.  Please follow up with your PCP regarding this.  They may need to start you on additional medications or send you to cardiology as an outpatient.  You had Matilyn Fehrman fast heart rate before coming into the hospital.  Please ask your PCP about [facility] cardiology referral.    Return for new, recurrent, or worsening symptoms.  Please ask your PCP to request records from this hospitalization so they know what was done and  what the next steps will be.   Increase activity slowly   Complete by: As directed    Increase activity slowly   Complete by: As directed    Increase activity slowly   Complete by: As directed      Allergies as of 04/28/2019      Reactions   Nsaids Other (See Comments)   Cannot take due to Kidney disease/Kidney function   Tolmetin Other (See Comments)   Cannot take due to Kidney Disease      Medication List    STOP taking these medications   hydrOXYzine 25 MG tablet Commonly known as: ATARAX/VISTARIL     TAKE these medications   allopurinol 100 MG tablet Commonly known as: ZYLOPRIM Take 100 mg by mouth at bedtime.   ALPRAZolam 1 MG tablet Commonly known as: XANAX Take 1 mg by mouth 2 (two) times daily as needed for anxiety.   amLODipine 10 MG tablet Commonly known as: NORVASC Take 1 tablet (10 mg total) by mouth daily.   calcitRIOL 0.5 MCG capsule Commonly known as: ROCALTROL Take 1 capsule (0.5 mcg total) by mouth daily with breakfast.   ferric citrate 1 GM 210 MG(Fe) tablet Commonly known as: AURYXIA Take 3 tablets (630 mg total) by mouth 3 (three) times daily with meals.   gabapentin 100 MG capsule Commonly known as: NEURONTIN Take 100 mg by mouth daily.   hydrALAZINE 25 MG tablet Commonly known as: APRESOLINE Take 2 tablets (50 mg total) by mouth every 8 (eight) hours.   lisinopril 20 MG tablet Commonly known as: ZESTRIL Take 1 tablet (20 mg total) by mouth daily.   metoprolol tartrate 25 MG tablet Commonly known as: LOPRESSOR Take 2 tablets (50 mg total) by mouth at bedtime. What changed: how much to take   multivitamin Tabs tablet Take 1 tablet by mouth at bedtime.   omeprazole 40 MG capsule Commonly known as: PRILOSEC Take 40 mg by mouth 2 (two) times daily.   potassium chloride 10 MEQ tablet Commonly known as: KLOR-CON Take 1 tablet (10 mEq total) by mouth daily.   pravastatin 20 MG tablet Commonly known as: PRAVACHOL Take 20 mg by  mouth at bedtime.   prochlorperazine 10 MG tablet Commonly known as: COMPAZINE Take 1 tablet (10 mg total) by mouth every 8 (eight) hours as needed for nausea or vomiting.   promethazine 25 MG tablet Commonly known as: PHENERGAN Take 1 tablet (25 mg total) by mouth every 8 (eight) hours as needed for nausea or vomiting.      Allergies  Allergen Reactions  . Nsaids Other (See Comments)    Cannot take due to Kidney disease/Kidney function   . Tolmetin Other (See Comments)    Cannot take due to Kidney Disease   Follow-up  Information    Long, Caryl Pina, [place]. Go on 05/01/2019.   Specialty: Physician Assistant Why: $RemoveBefore'@10'[id number]$ :30am Contact information: [address] [place] [address] [phone]            The results of significant diagnostics from this hospitalization (including imaging, microbiology, ancillary and laboratory) are listed below for reference.    Significant Diagnostic Studies: DG Chest 2 View  Result Date: 04/25/2019 CLINICAL DATA:  SVT EXAM: CHEST - 2 VIEW COMPARISON:  10/23/2018 FINDINGS: Right dialysis catheter in place with the tip in the right atrium. Heart is borderline in size. Bibasilar atelectasis. No effusions or edema. No acute bony abnormality. IMPRESSION: Bibasilar atelectasis. Electronically Signed   By: Rolm Baptise M.D.   On: 04/25/2019 00:33   DG Chest Port 1 View  Result Date: 04/25/2019 CLINICAL DATA:  Shortness of breath. EXAM: PORTABLE CHEST 1 VIEW COMPARISON:  Chest x-ray from same day at 00:12 hours. FINDINGS: Unchanged tunneled right internal jugular dialysis catheter. The heart size and mediastinal contours are within normal limits. Normal pulmonary vascularity. No focal consolidation, pleural effusion, or pneumothorax. No acute osseous abnormality. IMPRESSION: No active disease. Electronically Signed   By: Titus Dubin M.D.   On: 04/25/2019 14:30   ECHOCARDIOGRAM COMPLETE  Result Date: 04/27/2019 m  Patient Name:   Patricia [name] Date of Exam: 04/27/2019 Medical Rec #:  [id number]       Height:       67.0 in Accession #:    [id number]      Weight:       239.8 lb Date of Birth:  06-04-1975       BSA:          2.184 m Patient Age:    26 years        BP:           152/72 mmHg Patient Gender: F               HR:           71 bpm. Exam Location:  [place] Procedure: 2D Echo, Cardiac Doppler and Color Doppler Indications:    Elevated troponin  History:        Patient has prior history of Echocardiogram examinations, most                 recent 06/19/2015. Risk Factors:Hypertension, Dyslipidemia and                 Non-Smoker. GERD.  Sonographer:    Vickie Epley RDCS Referring Phys: [phone] Charisma Charlot CALDWELL [place]  1. Left ventricular ejection fraction, by estimation, is 55 to 60%. The left ventricle has normal function. The left ventricle has no regional wall motion abnormalities. Left ventricular diastolic parameters are consistent with Grade I diastolic dysfunction (impaired relaxation).  2. Right ventricular systolic function is normal. The right ventricular size is normal. Tricuspid regurgitation signal is inadequate for assessing PA pressure.  3. The mitral valve is normal in structure. Trivial mitral valve regurgitation. No evidence of mitral stenosis.  4. The aortic valve is tricuspid. Aortic valve regurgitation is not visualized. No aortic stenosis is present.  5. The inferior vena cava is normal in size with greater than 50% respiratory variability, suggesting right atrial pressure of 3 mmHg. FINDINGS  Left Ventricle: Left ventricular ejection fraction, by estimation, is 55 to 60%. The left ventricle has normal function. The left ventricle has no regional wall motion abnormalities. The left ventricular internal cavity size  was normal in size. There is  no left ventricular hypertrophy. Left ventricular diastolic parameters are consistent with Grade I diastolic dysfunction (impaired relaxation). Right Ventricle: The right  ventricular size is normal. No increase in right ventricular wall thickness. Right ventricular systolic function is normal. Tricuspid regurgitation signal is inadequate for assessing PA pressure. Left Atrium: Left atrial size was normal in size. Right Atrium: Right atrial size was normal in size. Pericardium: There is no evidence of pericardial effusion. Mitral Valve: The mitral valve is normal in structure. Mild mitral annular calcification. Trivial mitral valve regurgitation. No evidence of mitral valve stenosis. Tricuspid Valve: The tricuspid valve is normal in structure. Tricuspid valve regurgitation is not demonstrated. Aortic Valve: The aortic valve is tricuspid. Aortic valve regurgitation is not visualized. No aortic stenosis is present. Pulmonic Valve: The pulmonic valve was normal in structure. Pulmonic valve regurgitation is not visualized. Aorta: The aortic root is normal in size and structure. Venous: The inferior vena cava is normal in size with greater than 50% respiratory variability, suggesting right atrial pressure of 3 mmHg. IAS/Shunts: No atrial level shunt detected by color flow Doppler.  LEFT VENTRICLE PLAX 2D LVIDd:         4.90 cm  Diastology LVIDs:         3.20 cm  LV e' lateral:   7.40 cm/s LV PW:         0.90 cm  LV E/e' lateral: 7.2 LV IVS:        0.90 cm  LV e' medial:    5.66 cm/s LVOT diam:     2.00 cm  LV E/e' medial:  9.5 LV SV:         64 LV SV Index:   29 LVOT Area:     3.14 cm  RIGHT VENTRICLE RV S prime:     13.70 cm/s TAPSE (M-mode): 2.1 cm LEFT ATRIUM             Index       RIGHT ATRIUM           Index LA diam:        4.10 cm 1.88 cm/m  RA Area:     12.40 cm LA Vol (A2C):   48.6 ml 22.25 ml/m RA Volume:   32.10 ml  14.70 ml/m LA Vol (A4C):   43.9 ml 20.10 ml/m LA Biplane Vol: 49.2 ml 22.52 ml/m  AORTIC VALVE LVOT Vmax:   92.70 cm/s LVOT Vmean:  64.500 cm/s LVOT VTI:    0.204 m  AORTA Ao Root diam: 3.40 cm MITRAL VALVE MV Area (PHT): 3.27 cm    SHUNTS MV Decel Time: 232  msec    Systemic VTI:  0.20 m MV E velocity: 53.50 cm/s  Systemic Diam: 2.00 cm MV Corrissa Martello velocity: 60.00 cm/s MV E/Meeah Totino ratio:  0.89 Loralie Champagne MD Electronically signed by Loralie Champagne MD Signature Date/Time: 04/27/2019/2:41:17 PM    Final     Microbiology: Recent Results (from the past 240 hour(s))  SARS CORONAVIRUS 2 (TAT 6-24 HRS) Nasopharyngeal Nasopharyngeal Swab     Status: None   Collection Time: 04/25/19  2:55 AM   Specimen: Nasopharyngeal Swab  Result Value Ref Range Status   SARS Coronavirus 2 NEGATIVE NEGATIVE Final    Comment: (NOTE) SARS-CoV-2 target nucleic acids are NOT DETECTED. The SARS-CoV-2 RNA is generally detectable in upper and lower respiratory specimens during the acute phase of infection. Negative results do not preclude SARS-CoV-2 infection, do not rule out co-infections  with other pathogens, and should not be used as the sole basis for treatment or other patient management decisions. Negative results must be combined with clinical observations, patient history, and epidemiological information. The expected result is Negative. Fact Sheet for Patients: SugarRoll.be Fact Sheet for Healthcare Providers: https://www.woods-mathews.com/ This test is not yet approved or cleared by the [place] FDA and  has been authorized for detection and/or diagnosis of SARS-CoV-2 by FDA under an Emergency Use Authorization (EUA). This EUA will remain  in effect (meaning this test can be used) for the duration of the COVID-19 declaration under Section 56 4(b)(1) of the Act, 21 U.S.C. section 360bbb-3(b)(1), unless the authorization is terminated or revoked sooner. Performed at [facility], [place] [address] [place], [place] [address]   MRSA PCR Screening     Status: None   Collection Time: 04/25/19  6:01 AM   Specimen: Nasal Mucosa; Nasopharyngeal  Result Value Ref Range Status   MRSA by PCR NEGATIVE NEGATIVE Final    Comment:         The GeneXpert MRSA Assay (FDA approved for NASAL specimens only), is one component of Tevon Berhane comprehensive MRSA colonization surveillance program. It is not intended to diagnose MRSA infection nor to guide or monitor treatment for MRSA infections. Performed at [facility], [place] [address] [place], [place] [address]      Labs: Basic Metabolic Panel: Recent Labs  Lab 04/25/19 0037 04/25/19 0037 04/25/19 0151 04/25/19 1225 04/25/19 1730 04/26/19 0740 04/27/19 0820 04/28/19 0704  NA 142   < >  --  141 141 138 137 136  K 3.9   < >  --  5.3* 5.4* 5.1 4.5 4.2  CL 101   < >  --  100 101 98 97* 95*  CO2 33*   < >  --  $R'23 27 27 26 25  'dw$ GLUCOSE 130*   < >  --  103* 102* 98 105* 113*  BUN 26*   < >  --  35* 35* 42* 26* 39*  CREATININE 8.87*   < >  --  10.47* 10.49* 12.18* 9.22* 11.71*  CALCIUM 8.4*   < >  --  9.3 9.2 9.5 9.7 10.2  MG 1.5*  --   --  2.1  --   --  1.7  --   PHOS  --   --  4.6  --  5.8* 6.5* 6.7* 7.1*   < > = values in this interval not displayed.   Liver Function Tests: Recent Labs  Lab 04/25/19 1730 04/26/19 0740 04/27/19 0820 04/28/19 0704  AST  --   --  19  --   ALT  --   --  9  --   ALKPHOS  --   --  40  --   BILITOT  --   --  0.3  --   PROT  --   --  6.5  --   ALBUMIN 2.7* 2.8* 2.8* 2.8*   No results for input(s): LIPASE, AMYLASE in the last 168 hours. No results for input(s): AMMONIA in the last 168 hours. CBC: Recent Labs  Lab 04/25/19 0037 04/25/19 0037 04/25/19 1225 04/25/19 1818 04/26/19 0538 04/27/19 0820 04/28/19 0704  WBC 7.0  --   --  8.8 8.0 6.7 8.4  NEUTROABS  --   --   --   --   --  4.2  --   HGB 6.6*   < > 7.8* 8.5* 8.9* 9.2* 9.1*  HCT 22.2*   < > 25.4* 26.1* 26.3* 28.2* 28.6*  MCV 92.9  --   --  87.9 86.8 87.6 89.7  PLT 232  --   --  227 189 230 254   < > = values in this interval not displayed.   Cardiac Enzymes: No results for input(s): CKTOTAL, CKMB, CKMBINDEX, TROPONINI in the last 168 hours. BNP: BNP (last 3  results) Recent Labs    07/11/18 0242 08/23/18 2115  BNP 74.6 43.7    ProBNP (last 3 results) No results for input(s): PROBNP in the last 8760 hours.  CBG: Recent Labs  Lab 04/27/19 0928  GLUCAP 97       Signed:  Fayrene Helper MD.  [facility] 04/28/2019, 5:31 PM

## 2019-04-27 NOTE — Progress Notes (Addendum)
PROGRESS NOTE    Patricia [name]  VEL:[id number] DOB: 1975/01/24 DOA: 04/24/2019 PCP: Blair Heys, PA-C   Chief Complaint  Patient presents with  . Tachycardia    Brief Narrative:  44 year old female with history of PAT on metoprolol, ESRD on HD MWF, GIB/IDA, CVA and anxiety presenting with chest pain, shortness of breath and palpitation and found to be anemic with Hgb down to 6.6 (previously 8.0).  Reportedly had SVT when EMS arrived but converted to NSR with adenosine.  HR has been within normal range since then.  Has mild HS trop (36> 60> 90).  EKG without acute ischemic finding.  Received 2 units of blood.  Assessment & Plan:   Principal Problem:   Anemia Active Problems:   GI bleed   SVT (supraventricular tachycardia) (HCC)   Elevated troponin   ESRD on hemodialysis (HCC)  Symptomatic anemia- transfused 2 units with appropriate response.  Patient denies melena or hematochezia.  FOBT negative. 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  Troponin elevation could be Arieanna Pressey combination of delayed clearance due to ESRD and demand ischemia as above. -Optimize blood pressure control -follow repeat troponin (downtrending) -follow echo -> EF 25-85%, grade 1 diastolic dysfunction (see report)  Uncontrolled hypertension: improving SBP -Increase home metoprolol tartrate to 50 mg twice daily, amlodipine 10 mg daily,  hydralazine 25 mg TID, lisinopril 20 mg daily -As needed labetalol with parameters  ESRD on HD MWF -Nephrology consulted  Headache: Could be due to hypertension, persistent.  No focal neuro deficit or red flags. -As needed Tylenol -Optimize BP control   PAT/SVT-HR within normal range after adenosine by EMS. -Beta-blockers as above   DVT prophylaxis: heparin Code Status: full Family Communication: none at bedside Disposition:   Status is: Inpatient  Remains inpatient appropriate because:IV treatments appropriate due to intensity of illness or inability to take PO, Inpatient level of care appropriate due to severity of illness and significantly elevated BP's requiring IV meds and adjustment of PO meds   Dispo: The patient is from: Home              Anticipated d/c is to: Home              Anticipated d/c date is: 1 day              Patient currently is not medically stable to d/c. continued HA in setting of hypertensive urgency, will continue to adjust BP meds and follow with dialysis tomorrow  Consultants:   renal  Procedures:  Echo IMPRESSIONS    1. Left ventricular ejection fraction, by estimation, is 55 to 60%. The  left ventricle has normal function. The left ventricle has no regional  wall motion abnormalities. Left ventricular diastolic parameters are  consistent with Grade I diastolic  dysfunction (impaired relaxation).  2. Right ventricular systolic function is normal. The right ventricular  size is normal. Tricuspid regurgitation signal is inadequate for assessing  PA pressure.  3. The mitral valve is  normal in structure. Trivial mitral valve  regurgitation. No evidence of mitral stenosis.  4. The aortic valve is tricuspid. Aortic valve regurgitation is not  visualized. No aortic stenosis is present.  5. The inferior vena cava is normal in size with greater than 50%  respiratory variability, suggesting right atrial pressure of 3  mmHg.  Antimicrobials: Anti-infectives (From admission, onward)   None      Subjective: C/o continued headache  Objective: Vitals:   04/27/19 0340 04/27/19 0920 04/27/19 1157 04/27/19 1619  BP: (!) 152/72 (!) 176/81 (!) 168/96 (!) 173/92  Pulse: 74 73 71 71  Resp: $Remo'18 18 19 20  'vmHie$ Temp: 99 F (37.2 C) 98.8 F (37.1 C) 99.6 F (37.6 C) 98.1 F (36.7 C)  TempSrc: Oral Oral Oral Oral  SpO2: 96% 95% 97% 100%  Weight: 108.8 kg     Height:        Intake/Output Summary (Last 24 hours) at 04/27/2019 1839 Last data filed at 04/27/2019 1800 Gross per 24 hour  Intake 940 ml  Output 0 ml  Net 940 ml   Filed Weights   04/26/19 0720 04/26/19 1053 04/27/19 0340  Weight: 115 kg 112.2 kg 108.8 kg    Examination:  General: No acute distress. Cardiovascular: Heart sounds show Jacon Whetzel regular rate, and rhythm Lungs: Clear to auscultation bilaterally Abdomen: Soft, nontender, nondistended  Neurological: Alert and oriented 3. Moves all extremities 4. Cranial nerves II through XII grossly intact. Skin: Warm and dry. No rashes or lesions. Extremities: No clubbing or cyanosis. No edema.   Data Reviewed: I have personally reviewed following labs and imaging studies  CBC: Recent Labs  Lab 04/25/19 0037 04/25/19 1225 04/25/19 1818 04/26/19 0538 04/27/19 0820  WBC 7.0  --  8.8 8.0 6.7  NEUTROABS  --   --   --   --  4.2  HGB 6.6* 7.8* 8.5* 8.9* 9.2*  HCT 22.2* 25.4* 26.1* 26.3* 28.2*  MCV 92.9  --  87.9 86.8 87.6  PLT 232  --  227 189 485    Basic Metabolic Panel: Recent Labs  Lab 04/25/19 0037 04/25/19 0151 04/25/19 1225 04/25/19 1730 04/26/19 0740 04/27/19 0820  NA 142  --  141 141 138 137  K 3.9  --  5.3* 5.4* 5.1 4.5  CL 101  --  100 101 98 97*  CO2 33*  --  $R'23 27 27 26  'wQ$ GLUCOSE 130*  --  103* 102* 98 105*  BUN 26*  --  35* 35* 42* 26*  CREATININE 8.87*  --  10.47* 10.49* 12.18* 9.22*  CALCIUM 8.4*  --  9.3 9.2 9.5 9.7  MG 1.5*  --  2.1  --   --  1.7  PHOS  --  4.6   --  5.8* 6.5* 6.7*    GFR: Estimated Creatinine Clearance: 9.8 mL/min (Rosine Solecki) (by C-G formula based on SCr of 9.22 mg/dL (H)).  Liver Function Tests: Recent Labs  Lab 04/25/19 1730 04/26/19 0740 04/27/19 0820  AST  --   --  19  ALT  --   --  9  ALKPHOS  --   --  40  BILITOT  --   --  0.3  PROT  --   --  6.5  ALBUMIN 2.7* 2.8* 2.8*    CBG: Recent Labs  Lab 04/27/19 0928  GLUCAP 97     Recent Results (from the past 240 hour(s))  SARS CORONAVIRUS 2 (TAT 6-24 HRS) Nasopharyngeal Nasopharyngeal Swab  Status: None   Collection Time: 04/25/19  2:55 AM   Specimen: Nasopharyngeal Swab  Result Value Ref Range Status   SARS Coronavirus 2 NEGATIVE NEGATIVE Final    Comment: (NOTE) SARS-CoV-2 target nucleic acids are NOT DETECTED. The SARS-CoV-2 RNA is generally detectable in upper and lower respiratory specimens during the acute phase of infection. Negative results do not preclude SARS-CoV-2 infection, do not rule out co-infections with other pathogens, and should not be used as the sole basis for treatment or other patient management decisions. Negative results must be combined with clinical observations, patient history, and epidemiological information. The expected result is Negative. Fact Sheet for Patients: SugarRoll.be Fact Sheet for Healthcare Providers: https://www.woods-mathews.com/ This test is not yet approved or cleared by the [place] FDA and  has been authorized for detection and/or diagnosis of SARS-CoV-2 by FDA under an Emergency Use Authorization (EUA). This EUA will remain  in effect (meaning this test can be used) for the duration of the COVID-19 declaration under Section 56 4(b)(1) of the Act, 21 U.S.C. section 360bbb-3(b)(1), unless the authorization is terminated or revoked sooner. Performed at [facility], [place] [address], [place], [place] [address]   MRSA PCR Screening     Status: None    Collection Time: 04/25/19  6:01 AM   Specimen: Nasal Mucosa; Nasopharyngeal  Result Value Ref Range Status   MRSA by PCR NEGATIVE NEGATIVE Final    Comment:        The GeneXpert MRSA Assay (FDA approved for NASAL specimens only), is one component of Rhealyn Cullen comprehensive MRSA colonization surveillance program. It is not intended to diagnose MRSA infection nor to guide or monitor treatment for MRSA infections. Performed at [facility], [place] [address], [place], [place] [address]          Radiology Studies: ECHOCARDIOGRAM COMPLETE  Result Date: 04/27/2019 m  Patient Name:   Patricia [name] Date of Exam: 04/27/2019 Medical Rec #:  [id number]       Height:       67.0 in Accession #:    [id number]      Weight:       239.8 lb Date of Birth:  1975-03-21       BSA:          2.184 m Patient Age:    27 years        BP:           152/72 mmHg Patient Gender: F               HR:           71 bpm. Exam Location:  [facility] Procedure: 2D Echo, Cardiac Doppler and Color Doppler Indications:    Elevated troponin  History:        Patient has prior history of Echocardiogram examinations, most                 recent 06/19/2015. Risk Factors:Hypertension, Dyslipidemia and                 Non-Smoker. GERD.  Sonographer:    Vickie Epley RDCS Referring Phys: [id number] Kuper Rennels CALDWELL Dumas  1. Left ventricular ejection fraction, by estimation, is 55 to 60%. The left ventricle has normal function. The left ventricle has no regional wall motion abnormalities. Left ventricular diastolic parameters are consistent with Grade I diastolic dysfunction (impaired relaxation).  2. Right ventricular systolic function is normal. The right ventricular size is normal. Tricuspid regurgitation  signal is inadequate for assessing PA pressure.  3. The mitral valve is normal in structure. Trivial mitral valve regurgitation. No evidence of mitral stenosis.  4. The aortic valve is tricuspid. Aortic valve regurgitation is not  visualized. No aortic stenosis is present.  5. The inferior vena cava is normal in size with greater than 50% respiratory variability, suggesting right atrial pressure of 3 mmHg. FINDINGS  Left Ventricle: Left ventricular ejection fraction, by estimation, is 55 to 60%. The left ventricle has normal function. The left ventricle has no regional wall motion abnormalities. The left ventricular internal cavity size was normal in size. There is  no left ventricular hypertrophy. Left ventricular diastolic parameters are consistent with Grade I diastolic dysfunction (impaired relaxation). Right Ventricle: The right ventricular size is normal. No increase in right ventricular wall thickness. Right ventricular systolic function is normal. Tricuspid regurgitation signal is inadequate for assessing PA pressure. Left Atrium: Left atrial size was normal in size. Right Atrium: Right atrial size was normal in size. Pericardium: There is no evidence of pericardial effusion. Mitral Valve: The mitral valve is normal in structure. Mild mitral annular calcification. Trivial mitral valve regurgitation. No evidence of mitral valve stenosis. Tricuspid Valve: The tricuspid valve is normal in structure. Tricuspid valve regurgitation is not demonstrated. Aortic Valve: The aortic valve is tricuspid. Aortic valve regurgitation is not visualized. No aortic stenosis is present. Pulmonic Valve: The pulmonic valve was normal in structure. Pulmonic valve regurgitation is not visualized. Aorta: The aortic root is normal in size and structure. Venous: The inferior vena cava is normal in size with greater than 50% respiratory variability, suggesting right atrial pressure of 3 mmHg. IAS/Shunts: No atrial level shunt detected by color flow Doppler.  LEFT VENTRICLE PLAX 2D LVIDd:         4.90 cm  Diastology LVIDs:         3.20 cm  LV e' lateral:   7.40 cm/s LV PW:         0.90 cm  LV E/e' lateral: 7.2 LV IVS:        0.90 cm  LV e' medial:    5.66 cm/s  LVOT diam:     2.00 cm  LV E/e' medial:  9.5 LV SV:         64 LV SV Index:   29 LVOT Area:     3.14 cm  RIGHT VENTRICLE RV S prime:     13.70 cm/s TAPSE (M-mode): 2.1 cm LEFT ATRIUM             Index       RIGHT ATRIUM           Index LA diam:        4.10 cm 1.88 cm/m  RA Area:     12.40 cm LA Vol (A2C):   48.6 ml 22.25 ml/m RA Volume:   32.10 ml  14.70 ml/m LA Vol (A4C):   43.9 ml 20.10 ml/m LA Biplane Vol: 49.2 ml 22.52 ml/m  AORTIC VALVE LVOT Vmax:   92.70 cm/s LVOT Vmean:  64.500 cm/s LVOT VTI:    0.204 m  AORTA Ao Root diam: 3.40 cm MITRAL VALVE MV Area (PHT): 3.27 cm    SHUNTS MV Decel Time: 232 msec    Systemic VTI:  0.20 m MV E velocity: 53.50 cm/s  Systemic Diam: 2.00 cm MV Anikin Prosser velocity: 60.00 cm/s MV E/Keyuna Cuthrell ratio:  0.89 Loralie Champagne MD Electronically signed by Loralie Champagne MD Signature Date/Time: 04/27/2019/2:41:17 PM  Final         Scheduled Meds: . allopurinol  100 mg Oral QHS  . amLODipine  10 mg Oral Daily  . calcitRIOL  0.5 mcg Oral Q breakfast  . Chlorhexidine Gluconate Cloth  6 each Topical Q0600  . darbepoetin (ARANESP) injection - DIALYSIS  100 mcg Intravenous Q Wed-HD  . ferric citrate  630 mg Oral TID WC  . gabapentin  100 mg Oral Daily  . heparin injection (subcutaneous)  5,000 Units Subcutaneous Q8H  . hydrALAZINE  25 mg Oral Q8H  . lisinopril  20 mg Oral Daily  . metoprolol tartrate  50 mg Oral BID  . multivitamin  1 tablet Oral QHS  . pantoprazole (PROTONIX) IV  40 mg Intravenous Q12H  . pravastatin  20 mg Oral QHS  . sodium chloride flush  3 mL Intravenous Once   Continuous Infusions: . sodium chloride       LOS: 2 days    Time spent: over 30 min    Fayrene Helper, MD [facility]   To contact the attending provider between 7A-7P or the covering provider during after hours 7P-7A, please log into the web site [email or web address] and access using universal [place] password for that web site. If you do not have the password, please call the  hospital operator.  04/27/2019, 6:39 PM

## 2019-04-27 NOTE — Progress Notes (Signed)
  Echocardiogram 2D Echocardiogram has been performed.  Patricia [name] 04/27/2019, 8:58 AM

## 2019-04-28 LAB — CBC
HCT: 28.6 % — ABNORMAL LOW (ref 36.0–46.0)
Hemoglobin: 9.1 g/dL — ABNORMAL LOW (ref 12.0–15.0)
MCH: 28.5 pg (ref 26.0–34.0)
MCHC: 31.8 g/dL (ref 30.0–36.0)
MCV: 89.7 fL (ref 80.0–100.0)
Platelets: 254 10*3/uL (ref 150–400)
RBC: 3.19 MIL/uL — ABNORMAL LOW (ref 3.87–5.11)
RDW: 13.5 % (ref 11.5–15.5)
WBC: 8.4 10*3/uL (ref 4.0–10.5)
nRBC: 0 % (ref 0.0–0.2)

## 2019-04-28 LAB — RENAL FUNCTION PANEL
Albumin: 2.8 g/dL — ABNORMAL LOW (ref 3.5–5.0)
Anion gap: 16 — ABNORMAL HIGH (ref 5–15)
BUN: 39 mg/dL — ABNORMAL HIGH (ref 6–20)
CO2: 25 mmol/L (ref 22–32)
Calcium: 10.2 mg/dL (ref 8.9–10.3)
Chloride: 95 mmol/L — ABNORMAL LOW (ref 98–111)
Creatinine, Ser: 11.71 mg/dL — ABNORMAL HIGH (ref 0.44–1.00)
GFR calc Af Amer: 4 mL/min — ABNORMAL LOW (ref >60)
GFR calc non Af Amer: 3 mL/min — ABNORMAL LOW (ref >60)
Glucose, Bld: 113 mg/dL — ABNORMAL HIGH (ref 70–99)
Phosphorus: 7.1 mg/dL — ABNORMAL HIGH (ref 2.5–4.6)
Potassium: 4.2 mmol/L (ref 3.5–5.1)
Sodium: 136 mmol/L (ref 135–145)

## 2019-04-28 MED ORDER — SODIUM CHLORIDE 0.9 % IV SOLN: 100.0000 mL | INTRAVENOUS | Status: DC | PRN

## 2019-04-28 MED ORDER — HEPARIN SODIUM (PORCINE) 1000 UNIT/ML IJ SOLN
INTRAMUSCULAR | Status: AC
  Administered 2019-04-28: 1000 [IU]
  Filled 2019-04-28: qty 4

## 2019-04-28 MED ORDER — HYDRALAZINE HCL 50 MG PO TABS
50.0000 mg | ORAL_TABLET | Freq: Three times a day (TID) | ORAL | Status: DC
  Administered 2019-04-28: 14:00:00 50 mg via ORAL
  Filled 2019-04-28: qty 1

## 2019-04-28 MED ORDER — HYDRALAZINE HCL 25 MG PO TABS
25.0000 mg | ORAL_TABLET | ORAL | Status: DC
  Filled 2019-04-28: qty 1

## 2019-04-28 MED ORDER — PENTAFLUOROPROP-TETRAFLUOROETH EX AERO: 1.0000 "application " | INHALATION_SPRAY | CUTANEOUS | Status: DC | PRN

## 2019-04-28 MED ORDER — HYDRALAZINE HCL 25 MG PO TABS: 50.0000 mg | ORAL_TABLET | Freq: Three times a day (TID) | ORAL | 0 refills | Status: DC

## 2019-04-28 MED ORDER — PANTOPRAZOLE SODIUM 40 MG PO TBEC
40.0000 mg | DELAYED_RELEASE_TABLET | Freq: Two times a day (BID) | ORAL | Status: DC
  Administered 2019-04-28: 12:00:00 40 mg via ORAL
  Filled 2019-04-28 (×2): qty 1

## 2019-04-28 MED ORDER — ALTEPLASE 2 MG IJ SOLR: 2.0000 mg | Freq: Once | INTRAMUSCULAR | Status: DC | PRN

## 2019-04-28 MED ORDER — LIDOCAINE-PRILOCAINE 2.5-2.5 % EX CREA: 1.0000 "application " | TOPICAL_CREAM | CUTANEOUS | Status: DC | PRN

## 2019-04-28 MED ORDER — HEPARIN SODIUM (PORCINE) 1000 UNIT/ML DIALYSIS: 1000.0000 [IU] | INTRAMUSCULAR | Status: DC | PRN

## 2019-04-28 MED ORDER — LIDOCAINE HCL (PF) 1 % IJ SOLN: 5.0000 mL | INTRAMUSCULAR | Status: DC | PRN

## 2019-04-28 NOTE — TOC Initial Note (Signed)
Transition of Care [facility]) - Initial/Assessment Note    Patient Details  Name: Patricia [name] MRN: [id number] Date of Birth: [birth year]  [facility]) CM/SW Contact:    Zenon Mayo, RN Phone Number: 04/28/2019, 2:35 PM  Clinical Narrative:                 Patient from home, indep, states she has transportation home today, she has no issues getting her medications.  She has no needs.  Expected Discharge Plan: Home/Self Care Barriers to Discharge: No Barriers Identified   Patient Goals and CMS Choice Patient states their goals for this hospitalization and ongoing recovery are:: get better   Choice offered to / list presented to : NA  Expected Discharge Plan and Services Expected Discharge Plan: Home/Self Care In-house Referral: NA Discharge Planning Services: CM Consult Post Acute Care Choice: NA Living arrangements for the past 2 months: Single Family Home Expected Discharge Date: 04/28/19                 DME Agency: NA       HH Arranged: NA          Prior Living Arrangements/Services Living arrangements for the past 2 months: Single Family Home Lives with:: Adult Children Patient language and need for interpreter reviewed:: Yes Do you feel safe going back to the place where you live?: Yes      Need for Family Participation in Patient Care: Yes (Comment) Care giver support system in place?: Yes (comment)   Criminal Activity/Legal Involvement Pertinent to Current Situation/Hospitalization: No - Comment as needed  Activities of Daily Living Home Assistive Devices/Equipment: None ADL Screening (condition at time of admission) Patient's cognitive ability adequate to safely complete daily activities?: Yes Is the patient deaf or have difficulty hearing?: No Does the patient have difficulty seeing, even when wearing glasses/contacts?: No Does the patient have difficulty concentrating, remembering, or making decisions?: No Patient able to express  need for assistance with ADLs?: Yes Does the patient have difficulty dressing or bathing?: No Independently performs ADLs?: Yes (appropriate for developmental age) Does the patient have difficulty walking or climbing stairs?: No Weakness of Legs: None Weakness of Arms/Hands: None  Permission Sought/Granted                  Emotional Assessment Appearance:: Appears stated age Attitude/Demeanor/Rapport: Engaged Affect (typically observed): Appropriate Orientation: : Oriented to Self, Oriented to Place, Oriented to  Time, Oriented to Situation Alcohol / Substance Use: Not Applicable Psych Involvement: No (comment)  Admission diagnosis:  Hypomagnesemia [E83.42] SVT (supraventricular tachycardia) (HCC) [I47.1] Dyspnea [R06.00] Anemia [D64.9] Symptomatic anemia [D64.9] Patient Active Problem List   Diagnosis Date Noted  . Anemia 04/25/2019  . SVT (supraventricular tachycardia) ([place]) 04/25/2019  . Elevated troponin 04/25/2019  . ESRD on hemodialysis ([place]) 04/25/2019  . Acute on chronic blood loss anemia 05/21/2018  . GI bleed 05/20/2018  . ESRD on peritoneal dialysis ([place]) 04/01/2016  . Anemia due to chronic kidney disease 04/01/2016  . GERD (gastroesophageal reflux disease) 04/01/2016  . Nephrotic syndrome 10/02/2013  . Dyslipidemia 10/02/2013  . PAT (paroxysmal atrial tachycardia) ([place]) 09/29/2013  . Normocytic anemia 06/29/2013  . Glomerulonephritis 01/28/2013  . Morbid obesity ([place]) 07/04/2011  . HTN (hypertension) 07/04/2011  . Hypercholesterolemia 07/04/2011   PCP:  Blair Heys, PA-C Pharmacy:   [facility], [place] - [place] [address] AT [address] [place] [address]  Phone: [phone] Fax: [place], [place] [address] [place] [address] Phone: [phone] Fax: [phone]     Social Determinants of Health  (SDOH) Interventions    Readmission Risk Interventions Readmission Risk Prevention Plan 04/28/2019  Transportation Screening Complete  PCP or Specialist Appt within 3-5 Days Complete  HRI or [place] Complete  Social Work Consult for [place] Planning/Counseling Complete  Palliative Care Screening Not Applicable  Medication Review Press photographer) Complete  Some recent data might be hidden

## 2019-04-28 NOTE — Progress Notes (Signed)
[facility] Progress Note   Subjective: seen on dialysis.  Thinks she may go home today but BP high   Objective Vitals:   04/28/19 0655 04/28/19 0700 04/28/19 0730 04/28/19 0800  BP: (!) 196/100 (!) 197/98 (!) 183/104 (!) 197/105  Pulse: 70 69 68 73  Resp: $Remo'16 15 15 'dQbRi$ (!) 27  Temp:      TempSrc:      SpO2:      Weight:      Height:       Physical Exam General: Obese female in NAD supine breathing easily on room air Heart: RRR Lungs: no rales Abdomen: obese, NT Extremities:  no sig LE edema  Dialysis Access: [facility]    Additional Objective Labs: Basic Metabolic Panel: Recent Labs  Lab 04/26/19 0740 04/27/19 0820 04/28/19 0704  NA 138 137 136  K 5.1 4.5 4.2  CL 98 97* 95*  CO2 $Re'27 26 25  'Dqr$ GLUCOSE 98 105* 113*  BUN 42* 26* 39*  CREATININE 12.18* 9.22* 11.71*  CALCIUM 9.5 9.7 10.2  PHOS 6.5* 6.7* 7.1*   Liver Function Tests: Recent Labs  Lab 04/26/19 0740 04/27/19 0820 04/28/19 0704  AST  --  19  --   ALT  --  9  --   ALKPHOS  --  40  --   BILITOT  --  0.3  --   PROT  --  6.5  --   ALBUMIN 2.8* 2.8* 2.8*   No results for input(s): LIPASE, AMYLASE in the last 168 hours. CBC: Recent Labs  Lab 04/25/19 0037 04/25/19 1225 04/25/19 1818 04/25/19 1818 04/26/19 0538 04/27/19 0820 04/28/19 0704  WBC 7.0  --  8.8   < > 8.0 6.7 8.4  NEUTROABS  --   --   --   --   --  4.2  --   HGB 6.6*   < > 8.5*   < > 8.9* 9.2* 9.1*  HCT 22.2*   < > 26.1*   < > 26.3* 28.2* 28.6*  MCV 92.9  --  87.9  --  86.8 87.6 89.7  PLT 232  --  227   < > 189 230 254   < > = values in this interval not displayed.   Blood Culture    Component Value Date/Time   SDES BLOOD RIGHT HAND 08/23/2018 2120   SPECREQUEST  08/23/2018 2120    BOTTLES DRAWN AEROBIC ONLY Blood Culture adequate volume   CULT  08/23/2018 2120    NO GROWTH 5 DAYS Performed at [facility], [place] [address], [place], [place] [address]    REPTSTATUS 08/28/2018 FINAL 08/23/2018 2120    Cardiac  Enzymes: No results for input(s): CKTOTAL, CKMB, CKMBINDEX, TROPONINI in the last 168 hours. CBG: Recent Labs  Lab 04/27/19 0928  GLUCAP 97   Iron Studies:  Recent Labs    04/25/19 1222  IRON 66  TIBC 223*  FERRITIN 1,151*   '@lablastinr3'$ @ Studies/Results: ECHOCARDIOGRAM COMPLETE  Result Date: 04/27/2019 m  Patient Name:   ANAISA RADI Date of Exam: 04/27/2019 Medical Rec #:  [id number]       Height:       67.0 in Accession #:    [id number]      Weight:       239.8 lb Date of Birth:  December 18, 1975       BSA:          2.184 m Patient Age:    44 years  BP:           152/72 mmHg Patient Gender: F               HR:           71 bpm. Exam Location:   Procedure: 2D Echo, Cardiac Doppler and Color Doppler Indications:    Elevated troponin  History:        Patient has prior history of Echocardiogram examinations, most                 recent 06/19/2015. Risk Factors:Hypertension, Dyslipidemia and                 Non-Smoker. GERD.  Sonographer:    Vickie Epley RDCS Referring Phys: [phone] A CALDWELL [place]  1. Left ventricular ejection fraction, by estimation, is 55 to 60%. The left ventricle has normal function. The left ventricle has no regional wall motion abnormalities. Left ventricular diastolic parameters are consistent with Grade I diastolic dysfunction (impaired relaxation).  2. Right ventricular systolic function is normal. The right ventricular size is normal. Tricuspid regurgitation signal is inadequate for assessing PA pressure.  3. The mitral valve is normal in structure. Trivial mitral valve regurgitation. No evidence of mitral stenosis.  4. The aortic valve is tricuspid. Aortic valve regurgitation is not visualized. No aortic stenosis is present.  5. The inferior vena cava is normal in size with greater than 50% respiratory variability, suggesting right atrial pressure of 3 mmHg. FINDINGS  Left Ventricle: Left ventricular ejection fraction, by estimation, is 55 to 60%.  The left ventricle has normal function. The left ventricle has no regional wall motion abnormalities. The left ventricular internal cavity size was normal in size. There is  no left ventricular hypertrophy. Left ventricular diastolic parameters are consistent with Grade I diastolic dysfunction (impaired relaxation). Right Ventricle: The right ventricular size is normal. No increase in right ventricular wall thickness. Right ventricular systolic function is normal. Tricuspid regurgitation signal is inadequate for assessing PA pressure. Left Atrium: Left atrial size was normal in size. Right Atrium: Right atrial size was normal in size. Pericardium: There is no evidence of pericardial effusion. Mitral Valve: The mitral valve is normal in structure. Mild mitral annular calcification. Trivial mitral valve regurgitation. No evidence of mitral valve stenosis. Tricuspid Valve: The tricuspid valve is normal in structure. Tricuspid valve regurgitation is not demonstrated. Aortic Valve: The aortic valve is tricuspid. Aortic valve regurgitation is not visualized. No aortic stenosis is present. Pulmonic Valve: The pulmonic valve was normal in structure. Pulmonic valve regurgitation is not visualized. Aorta: The aortic root is normal in size and structure. Venous: The inferior vena cava is normal in size with greater than 50% respiratory variability, suggesting right atrial pressure of 3 mmHg. IAS/Shunts: No atrial level shunt detected by color flow Doppler.  LEFT VENTRICLE PLAX 2D LVIDd:         4.90 cm  Diastology LVIDs:         3.20 cm  LV e' lateral:   7.40 cm/s LV PW:         0.90 cm  LV E/e' lateral: 7.2 LV IVS:        0.90 cm  LV e' medial:    5.66 cm/s LVOT diam:     2.00 cm  LV E/e' medial:  9.5 LV SV:         64 LV SV Index:   29 LVOT Area:     3.14 cm  RIGHT  VENTRICLE RV S prime:     13.70 cm/s TAPSE (M-mode): 2.1 cm LEFT ATRIUM             Index       RIGHT ATRIUM           Index LA diam:        4.10 cm 1.88 cm/m   RA Area:     12.40 cm LA Vol (A2C):   48.6 ml 22.25 ml/m RA Volume:   32.10 ml  14.70 ml/m LA Vol (A4C):   43.9 ml 20.10 ml/m LA Biplane Vol: 49.2 ml 22.52 ml/m  AORTIC VALVE LVOT Vmax:   92.70 cm/s LVOT Vmean:  64.500 cm/s LVOT VTI:    0.204 m  AORTA Ao Root diam: 3.40 cm MITRAL VALVE MV Area (PHT): 3.27 cm    SHUNTS MV Decel Time: 232 msec    Systemic VTI:  0.20 m MV E velocity: 53.50 cm/s  Systemic Diam: 2.00 cm MV A velocity: 60.00 cm/s MV E/A ratio:  0.89 Loralie Champagne MD Electronically signed by Loralie Champagne MD Signature Date/Time: 04/27/2019/2:41:17 PM    Final    Medications: . sodium chloride    . sodium chloride    . sodium chloride     . allopurinol  100 mg Oral QHS  . amLODipine  10 mg Oral Daily  . calcitRIOL  0.5 mcg Oral Q breakfast  . Chlorhexidine Gluconate Cloth  6 each Topical Q0600  . darbepoetin (ARANESP) injection - DIALYSIS  100 mcg Intravenous Q Wed-HD  . ferric citrate  630 mg Oral TID WC  . gabapentin  100 mg Oral Daily  . heparin injection (subcutaneous)  5,000 Units Subcutaneous Q8H  . hydrALAZINE  25 mg Oral To Hemo  . hydrALAZINE  50 mg Oral Q8H  . lisinopril  20 mg Oral Daily  . metoprolol tartrate  50 mg Oral BID  . multivitamin  1 tablet Oral QHS  . pantoprazole (PROTONIX) IV  40 mg Intravenous Q12H  . pravastatin  20 mg Oral QHS  . sodium chloride flush  3 mL Intravenous Once     Dialysis Orders:[place] MWF 4 hrs 180NRe 400/800 111.5 kg 2.0 K/2.0 Ca RIJ TDC -Heparin 5000 units IV TIW -No ESA/Venofer ordered (Last HGB 7.0 04/17/19 Tsat 41 03/13/19) -No incenter VDRA (still taking Calcitriol 1 mcg PO daily from PD orders)  Assessment/Plan: 1. Symptomatic Anemia-HGB 7.0 4/14 > 9.1 stable now. FOBT negative.  Lorin Picket  will cause dark stools. No recent OP ESA. Has rec'd 2 units of PRBCs.HGB 9.2. Aranesp 100 started 4/21 tsat 30 %  2. SVT-converted to SR with adenosine. Per primary. Has been followed as OP by Dr. Sallyanne Kuster. Metoprolol increased per  primary. Metoprolol increased per primary.  3. Elevated troponin in setting of ESRD-per primary.  4. ESRD - MWF HD 04/23 on schedule. [place] for heparin - getting SQ since 4/20.2K 2 Ca bath today. Plan is to return to PD eventually 5. Hypertension/volume-BP remains high without cramping with current meds and UF  ^ goal today to 4 L - pre HD standing wt 110 kg - get standing wt post HD. Hydralazine dose ^ 6. Metabolic bone disease - Continue daily oral calcitriol, binders.Ca up a little today 7. Nutrition - Renal diet  renal vits, nepro  Myriam Jacobson, [facility] 04/28/2019, 8:18 AM  [employer] [phone]

## 2019-04-29 ENCOUNTER — Telehealth: Payer: Self-pay | Admitting: Nurse Practitioner

## 2019-04-29 NOTE — Telephone Encounter (Signed)
Transition of care contact from inpatient facility  Date of Discharge: 04/28/2019 Date of Contact: 04/29/2019 Method of contact: Phone  Attempted to contact patient to discuss transition of care from inpatient admission. Patient did not answer the phone. Message was left on the patient's voicemail with call back number [phone].

## 2019-04-30 ENCOUNTER — Telehealth: Payer: Self-pay | Admitting: Nurse Practitioner

## 2019-04-30 NOTE — Telephone Encounter (Signed)
Transition of care contact from inpatient facility  Date of discharge 04/28/2019:  Date of contact: 04/30/2019 Method: Phone Spoke to: Patient  Patient contacted to discuss transition of care from recent inpatient hospitalization. Patient was admitted to [facility] from 04/19-04/23/2021 with discharge diagnosis of Anemia, SVR, uncontrolled HTN.   Medication changes were reviewed.  Patient will follow up with his/her outpatient HD unit on: 05/01/2019

## 2019-05-10 ENCOUNTER — Inpatient Hospital Stay (HOSPITAL_COMMUNITY): Payer: Medicare Other

## 2019-05-10 ENCOUNTER — Other Ambulatory Visit: Payer: Self-pay

## 2019-05-10 ENCOUNTER — Inpatient Hospital Stay (HOSPITAL_COMMUNITY)
Admission: EM | Admit: 2019-05-10 | Discharge: 2019-05-13 | DRG: 286 | Disposition: A | Discharge status: Home / Self Care | Payer: Medicare Other | Source: Ambulatory Visit | Attending: Cardiovascular Disease | Admitting: Cardiovascular Disease

## 2019-05-10 ENCOUNTER — Emergency Department (HOSPITAL_COMMUNITY): Payer: Medicare Other

## 2019-05-10 DIAGNOSIS — R1011 Right upper quadrant pain: Secondary | ICD-10-CM

## 2019-05-10 DIAGNOSIS — Z20822 Contact with and (suspected) exposure to covid-19: Secondary | ICD-10-CM | POA: Diagnosis present

## 2019-05-10 DIAGNOSIS — R072 Precordial pain: Secondary | ICD-10-CM | POA: Diagnosis present

## 2019-05-10 DIAGNOSIS — Z992 Dependence on renal dialysis: Secondary | ICD-10-CM

## 2019-05-10 DIAGNOSIS — Z8249 Family history of ischemic heart disease and other diseases of the circulatory system: Secondary | ICD-10-CM | POA: Diagnosis not present

## 2019-05-10 DIAGNOSIS — Z79899 Other long term (current) drug therapy: Secondary | ICD-10-CM | POA: Diagnosis not present

## 2019-05-10 DIAGNOSIS — N2581 Secondary hyperparathyroidism of renal origin: Secondary | ICD-10-CM | POA: Diagnosis present

## 2019-05-10 DIAGNOSIS — Z8673 Personal history of transient ischemic attack (TIA), and cerebral infarction without residual deficits: Secondary | ICD-10-CM

## 2019-05-10 DIAGNOSIS — E875 Hyperkalemia: Secondary | ICD-10-CM | POA: Diagnosis present

## 2019-05-10 DIAGNOSIS — Z5320 Procedure and treatment not carried out because of patient's decision for unspecified reasons: Secondary | ICD-10-CM | POA: Diagnosis not present

## 2019-05-10 DIAGNOSIS — G459 Transient cerebral ischemic attack, unspecified: Secondary | ICD-10-CM | POA: Diagnosis present

## 2019-05-10 DIAGNOSIS — M109 Gout, unspecified: Secondary | ICD-10-CM | POA: Diagnosis present

## 2019-05-10 DIAGNOSIS — E785 Hyperlipidemia, unspecified: Secondary | ICD-10-CM | POA: Diagnosis present

## 2019-05-10 DIAGNOSIS — D631 Anemia in chronic kidney disease: Secondary | ICD-10-CM | POA: Diagnosis present

## 2019-05-10 DIAGNOSIS — F40240 Claustrophobia: Secondary | ICD-10-CM | POA: Diagnosis present

## 2019-05-10 DIAGNOSIS — K219 Gastro-esophageal reflux disease without esophagitis: Secondary | ICD-10-CM | POA: Diagnosis present

## 2019-05-10 DIAGNOSIS — F419 Anxiety disorder, unspecified: Secondary | ICD-10-CM | POA: Diagnosis present

## 2019-05-10 DIAGNOSIS — E669 Obesity, unspecified: Secondary | ICD-10-CM | POA: Diagnosis present

## 2019-05-10 DIAGNOSIS — K659 Peritonitis, unspecified: Secondary | ICD-10-CM | POA: Diagnosis present

## 2019-05-10 DIAGNOSIS — R079 Chest pain, unspecified: Secondary | ICD-10-CM | POA: Diagnosis present

## 2019-05-10 DIAGNOSIS — N186 End stage renal disease: Secondary | ICD-10-CM | POA: Diagnosis present

## 2019-05-10 DIAGNOSIS — R0789 Other chest pain: Principal | ICD-10-CM | POA: Diagnosis present

## 2019-05-10 DIAGNOSIS — I120 Hypertensive chronic kidney disease with stage 5 chronic kidney disease or end stage renal disease: Secondary | ICD-10-CM | POA: Diagnosis present

## 2019-05-10 DIAGNOSIS — M1990 Unspecified osteoarthritis, unspecified site: Secondary | ICD-10-CM | POA: Diagnosis present

## 2019-05-10 DIAGNOSIS — Z888 Allergy status to other drugs, medicaments and biological substances status: Secondary | ICD-10-CM

## 2019-05-10 LAB — I-STAT BETA HCG BLOOD, ED (MC, WL, AP ONLY): I-stat hCG, quantitative: <5 m[IU]/mL (ref <5)

## 2019-05-10 LAB — BASIC METABOLIC PANEL
Anion gap: 16 — ABNORMAL HIGH (ref 5–15)
BUN: 14 mg/dL (ref 6–20)
CO2: 26 mmol/L (ref 22–32)
Calcium: 9 mg/dL (ref 8.9–10.3)
Chloride: 93 mmol/L — ABNORMAL LOW (ref 98–111)
Creatinine, Ser: 5.34 mg/dL — ABNORMAL HIGH (ref 0.44–1.00)
GFR calc Af Amer: 10 mL/min — ABNORMAL LOW (ref >60)
GFR calc non Af Amer: 9 mL/min — ABNORMAL LOW (ref >60)
Glucose, Bld: 112 mg/dL — ABNORMAL HIGH (ref 70–99)
Potassium: 4.7 mmol/L (ref 3.5–5.1)
Sodium: 135 mmol/L (ref 135–145)

## 2019-05-10 LAB — TROPONIN I (HIGH SENSITIVITY)
Troponin I (High Sensitivity): 7 ng/L (ref <18)
Troponin I (High Sensitivity): 7 ng/L (ref <18)

## 2019-05-10 LAB — CBC
HCT: 31.5 % — ABNORMAL LOW (ref 36.0–46.0)
Hemoglobin: 9.7 g/dL — ABNORMAL LOW (ref 12.0–15.0)
MCH: 28.6 pg (ref 26.0–34.0)
MCHC: 30.8 g/dL (ref 30.0–36.0)
MCV: 92.9 fL (ref 80.0–100.0)
Platelets: 229 10*3/uL (ref 150–400)
RBC: 3.39 MIL/uL — ABNORMAL LOW (ref 3.87–5.11)
RDW: 15.3 % (ref 11.5–15.5)
WBC: 10 10*3/uL (ref 4.0–10.5)
nRBC: 0.2 % (ref 0.0–0.2)

## 2019-05-10 LAB — HEPATIC FUNCTION PANEL
ALT: 16 U/L (ref 0–44)
AST: 26 U/L (ref 15–41)
Albumin: 3.7 g/dL (ref 3.5–5.0)
Alkaline Phosphatase: 40 U/L (ref 38–126)
Bilirubin, Direct: <0.1 mg/dL (ref 0.0–0.2)
Total Bilirubin: 0.7 mg/dL (ref 0.3–1.2)
Total Protein: 7.5 g/dL (ref 6.5–8.1)

## 2019-05-10 LAB — D-DIMER, QUANTITATIVE: D-Dimer, Quant: 1.78 ug/mL-FEU — ABNORMAL HIGH (ref 0.00–0.50)

## 2019-05-10 MED ORDER — SODIUM CHLORIDE 0.9% FLUSH
3.0000 mL | Freq: Once | INTRAVENOUS | Status: AC
  Administered 2019-05-10: 3 mL via INTRAVENOUS

## 2019-05-10 MED ORDER — ALPRAZOLAM 0.5 MG PO TABS
1.0000 mg | ORAL_TABLET | Freq: Two times a day (BID) | ORAL | Status: DC | PRN
  Administered 2019-05-11 – 2019-05-13 (×3): 1 mg via ORAL
  Filled 2019-05-10 (×3): qty 2

## 2019-05-10 MED ORDER — SODIUM CHLORIDE 0.9 % IV SOLN: INTRAVENOUS | Status: DC

## 2019-05-10 MED ORDER — LISINOPRIL 20 MG PO TABS: 20.0000 mg | ORAL_TABLET | Freq: Every day | ORAL | Status: DC

## 2019-05-10 MED ORDER — RENA-VITE PO TABS
1.0000 | ORAL_TABLET | Freq: Every day | ORAL | Status: DC
  Filled 2019-05-10: qty 1

## 2019-05-10 MED ORDER — ONDANSETRON HCL 4 MG/2ML IJ SOLN
4.0000 mg | Freq: Once | INTRAMUSCULAR | Status: AC
  Administered 2019-05-10: 4 mg via INTRAVENOUS
  Filled 2019-05-10: qty 2

## 2019-05-10 MED ORDER — HEPARIN BOLUS VIA INFUSION
4000.0000 [IU] | Freq: Once | INTRAVENOUS | Status: AC
  Administered 2019-05-10: 4000 [IU] via INTRAVENOUS
  Filled 2019-05-10: qty 4000

## 2019-05-10 MED ORDER — AMLODIPINE BESYLATE 10 MG PO TABS
10.0000 mg | ORAL_TABLET | Freq: Every day | ORAL | Status: DC
  Administered 2019-05-12 – 2019-05-13 (×2): 10 mg via ORAL
  Filled 2019-05-10: qty 2
  Filled 2019-05-10 (×2): qty 1

## 2019-05-10 MED ORDER — PANTOPRAZOLE SODIUM 40 MG PO TBEC
40.0000 mg | DELAYED_RELEASE_TABLET | Freq: Every day | ORAL | Status: DC
  Administered 2019-05-12 – 2019-05-13 (×2): 40 mg via ORAL
  Filled 2019-05-10 (×2): qty 1

## 2019-05-10 MED ORDER — PRAVASTATIN SODIUM 10 MG PO TABS
20.0000 mg | ORAL_TABLET | Freq: Every day | ORAL | Status: DC
  Administered 2019-05-10: 20 mg via ORAL

## 2019-05-10 MED ORDER — CALCITRIOL 0.5 MCG PO CAPS
0.5000 ug | ORAL_CAPSULE | Freq: Every day | ORAL | Status: DC
  Administered 2019-05-12 – 2019-05-13 (×2): 0.5 ug via ORAL
  Filled 2019-05-10 (×3): qty 1

## 2019-05-10 MED ORDER — HEPARIN (PORCINE) 25000 UT/250ML-% IV SOLN
1250.0000 [IU]/h | INTRAVENOUS | Status: DC
  Administered 2019-05-10: 1050 [IU]/h via INTRAVENOUS
  Administered 2019-05-11: 1250 [IU]/h via INTRAVENOUS
  Filled 2019-05-10 (×2): qty 250

## 2019-05-10 MED ORDER — PROMETHAZINE HCL 25 MG PO TABS: 25.0000 mg | ORAL_TABLET | Freq: Three times a day (TID) | ORAL | Status: DC | PRN

## 2019-05-10 MED ORDER — HYDRALAZINE HCL 50 MG PO TABS
50.0000 mg | ORAL_TABLET | Freq: Three times a day (TID) | ORAL | Status: DC
  Administered 2019-05-11 – 2019-05-13 (×3): 50 mg via ORAL
  Filled 2019-05-10 (×5): qty 1
  Filled 2019-05-10: qty 2

## 2019-05-10 MED ORDER — ALLOPURINOL 100 MG PO TABS
100.0000 mg | ORAL_TABLET | Freq: Every day | ORAL | Status: DC
  Administered 2019-05-11 – 2019-05-13 (×2): 100 mg via ORAL
  Filled 2019-05-10 (×4): qty 1

## 2019-05-10 MED ORDER — MORPHINE SULFATE (PF) 4 MG/ML IV SOLN
4.0000 mg | Freq: Once | INTRAVENOUS | Status: AC
  Administered 2019-05-10: 4 mg via INTRAVENOUS
  Filled 2019-05-10: qty 1

## 2019-05-10 MED ORDER — GABAPENTIN 100 MG PO CAPS
100.0000 mg | ORAL_CAPSULE | Freq: Every day | ORAL | Status: DC
  Administered 2019-05-10 – 2019-05-13 (×3): 100 mg via ORAL
  Filled 2019-05-10 (×3): qty 1

## 2019-05-10 MED ORDER — METOPROLOL TARTRATE 50 MG PO TABS
50.0000 mg | ORAL_TABLET | Freq: Every day | ORAL | Status: DC
  Administered 2019-05-11: 50 mg via ORAL
  Filled 2019-05-10: qty 2
  Filled 2019-05-10 (×2): qty 1

## 2019-05-10 NOTE — ED Provider Notes (Signed)
Emergency Department Provider Note   I have reviewed the triage vital signs and the nursing notes.   HISTORY  Chief Complaint No chief complaint on file.   HPI Patricia [name] is a 44 y.o. female with PMH of ESRD, HTN, and HLD presents to the ED with severe chest pressure which began immediately after procedural sedation today.  Patient was having an HD catheter placed under procedural sedation.  When she awoke she had severe pressure in her left chest radiating to the low back and left shoulder.  She reports worsening pain with deep breathing.  Her pain overall has improved.  She received nitroglycerin on scene with EMS and had no improvement in her pain symptoms.  She denies any sharp pain.  She does feel somewhat short of breath but states that overall symptoms have improved.  She does not make urine.  Her next dialysis session is Friday.   Patient was admitted 2 weeks ago with SVT and symptomatic anemia.  She had elevated troponins at that time thought to be secondary to demand and decreased clearance from her ESRD.  She was referred to cardiology as an outpatient but has not seen them as of yet. No prior history of ACS. No DVT/PE history.    Past Medical History:  Diagnosis Date  . Acid reflux   . Anemia 04/2019   REQUIRING BLOOD TRANSFUSIONS  . Anxiety   . Arthritis   . Dyspnea   . Dysrhythmia    tachycardia  . ESRD (end stage renal disease) ([place])    TTHSAT- [place]  . GI bleeding 12/2017  . Gout   . Headache(784.0)    "related to chemo; sometimes weekly" (09/12/2013)  . High cholesterol   . History of blood transfusion "a couple"   "related to low counts"  . Hypertension   . Iron deficiency anemia    "get epogen shots q month" (02/20/2014)  . 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 Peritoneal dialysis status ([place])   . Stroke [facility] 08/15/2017    Patient Active Problem List   Diagnosis Date  Noted  . Anemia 04/25/2019  . SVT (supraventricular tachycardia) ([place]) 04/25/2019  . Elevated troponin 04/25/2019  . ESRD on hemodialysis ([place]) 04/25/2019  . Acute on chronic blood loss anemia 05/21/2018  . GI bleed 05/20/2018  . ESRD on peritoneal dialysis ([place]) 04/01/2016  . Anemia due to chronic kidney disease 04/01/2016  . GERD (gastroesophageal reflux disease) 04/01/2016  . Nephrotic syndrome 10/02/2013  . Dyslipidemia 10/02/2013  . PAT (paroxysmal atrial tachycardia) ([place]) 09/29/2013  . Normocytic anemia 06/29/2013  . Glomerulonephritis 01/28/2013  . Morbid obesity ([place]) 07/04/2011  . HTN (hypertension) 07/04/2011  . Hypercholesterolemia 07/04/2011    Past Surgical History:  Procedure Laterality Date  . ABDOMINAL HYSTERECTOMY  2010   "laparoscopic"  . ANKLE FRACTURE SURGERY Right 1994  . AV FISTULA PLACEMENT Left 04/14/2016   Procedure: ARTERIOVENOUS (AV) FISTULA CREATION;  Surgeon: Angelia Mould, MD;  Location: [facility];  Service: Vascular;  Laterality: Left;  . AV FISTULA PLACEMENT Left 08/09/2017   Procedure: INSERTION OF ARTERIOVENOUS (AV) GORE-TEX GRAFT LEFT ARM;  Surgeon: Elam Dutch, MD;  Location: [place];  Service: Vascular;  Laterality: Left;  . [place] REMOVAL Left 08/11/2017   Procedure: REMOVAL OF ARTERIOVENOUS GORETEX GRAFT ([place]);  Surgeon: Serafina Mitchell, MD;  Location[facility];  Service: Vascular;  Laterality: Left;  . BIOPSY  09/03/2017   Procedure: BIOPSY;  Surgeon: Otis Brace, MD;  Location: [place];  Service: Gastroenterology;;  . BIOPSY  12/24/2017   Procedure: BIOPSY;  Surgeon: Irving Copas., MD;  Location: [place];  Service: Gastroenterology;;  . CARDIAC CATHETERIZATION  2000's  . COLONOSCOPY WITH PROPOFOL N/A 09/04/2017   Procedure: COLONOSCOPY WITH PROPOFOL;  Surgeon: Ronnette Juniper, MD;  Location: [place];  Service: Gastroenterology;  Laterality: N/A;  . ENTEROSCOPY N/A 12/24/2017   Procedure: ENTEROSCOPY;  Surgeon:  Rush Landmark Telford Nab., MD;  Location: [place];  Service: Gastroenterology;  Laterality: N/A;  . ESOPHAGOGASTRODUODENOSCOPY    . ESOPHAGOGASTRODUODENOSCOPY (EGD) WITH PROPOFOL Left 06/04/2017   Procedure: ESOPHAGOGASTRODUODENOSCOPY (EGD) WITH PROPOFOL;  Surgeon: Arta Silence, MD;  Location: [place];  Service: Endoscopy;  Laterality: Left;  . ESOPHAGOGASTRODUODENOSCOPY (EGD) WITH PROPOFOL N/A 09/03/2017   Procedure: ESOPHAGOGASTRODUODENOSCOPY (EGD) WITH PROPOFOL;  Surgeon: Otis Brace, MD;  Location: [place];  Service: Gastroenterology;  Laterality: N/A;  . FISTULA SUPERFICIALIZATION Left 04/02/2017   Procedure: FISTULA SUPERFICIALIZATION LEFT ARM;  Surgeon: Serafina Mitchell, MD;  Location: [place];  Service: Vascular;  Laterality: Left;  . FISTULOGRAM Left 04/07/2016   Procedure: FISTULOGRAM;  Surgeon: Waynetta Sandy, MD;  Location: [place];  Service: Vascular;  Laterality: Left;  . FRACTURE SURGERY     right ankle  . GIVENS CAPSULE STUDY N/A 10/29/2017   Procedure: GIVENS CAPSULE STUDY;  Surgeon: Wilford Corner, MD;  Location: [place];  Service: Endoscopy;  Laterality: N/A;  . GIVENS CAPSULE STUDY N/A 12/24/2017   Procedure: GIVENS CAPSULE STUDY;  Surgeon: Irving Copas., MD;  Location: [place];  Service: Gastroenterology;  Laterality: N/A;  . GIVENS CAPSULE STUDY N/A 05/22/2018   Procedure: GIVENS CAPSULE STUDY;  Surgeon: Irving Copas., MD;  Location: [place];  Service: Gastroenterology;  Laterality: N/A;  . INSERTION OF DIALYSIS CATHETER Right 04/07/2016   Procedure: INSERTION OF DIALYSIS CATHETER;  Surgeon: Waynetta Sandy, MD;  Location: [place];  Service: Vascular;  Laterality: Right;  . INSERTION OF DIALYSIS CATHETER Right 04/04/2017   Procedure: INSERTION OF DIALYSIS CATHETER;  Surgeon: Waynetta Sandy, MD;  Location: [place];  Service: Vascular;  Laterality: Right;  . LIGATION OF ARTERIOVENOUS  FISTULA Left  04/14/2016   Procedure: LIGATION OF ARTERIOVENOUS  FISTULA;  Surgeon: Angelia Mould, MD;  Location: [place];  Service: Vascular;  Laterality: Left;  . PATCH ANGIOPLASTY Left 06/19/2016   Procedure: PATCH ANGIOPLASTY LEFT ARTERIOVENOUS FISTULA;  Surgeon: Angelia Mould, MD;  Location: [place];  Service: Vascular;  Laterality: Left;  . POLYPECTOMY  09/04/2017   Procedure: POLYPECTOMY;  Surgeon: Ronnette Juniper, MD;  Location: [facility] ENDOSCOPY;  Service: Gastroenterology;;  . REVISON OF ARTERIOVENOUS FISTULA Left 06/19/2016   Procedure: REVISION SUPERFICIALIZATION OF BRACHIOCEPHALIC ARTERIOVENOUS FISTULA;  Surgeon: Angelia Mould, MD;  Location: [place];  Service: Vascular;  Laterality: Left;    Allergies Nsaids and Tolmetin  Family History  Problem Relation Age of Onset  . Hypertension Mother   . Thyroid disease Mother   . Coronary artery disease Father   . Hypertension Father   . Diabetes Father   . Colon cancer Maternal Aunt   . Esophageal cancer Maternal Uncle   . Inflammatory bowel disease Neg Hx   . Liver disease Neg Hx   . Pancreatic cancer Neg Hx   . Rectal cancer Neg Hx   . Stomach cancer Neg Hx     Social History Social History   Tobacco Use  .  Smoking status: Never Smoker  . Smokeless tobacco: Never Used  Substance Use Topics  . Alcohol use: No  . Drug use: No    Review of Systems  Constitutional: No fever/chills Eyes: No visual changes. ENT: No sore throat. Cardiovascular: Positive chest pain/pressure. Respiratory: Mild shortness of breath. Gastrointestinal: No abdominal pain.  No nausea, no vomiting.  No diarrhea.  No constipation. Genitourinary: Negative for dysuria. Musculoskeletal: Negative for back pain. Skin: Negative for rash. Neurological: Negative for headaches, focal weakness or numbness.  10-point ROS otherwise negative.  ____________________________________________   PHYSICAL EXAM:  VITAL SIGNS: ED Triage Vitals  Enc Vitals  Group     BP 05/10/19 1536 110/75     Pulse Rate 05/10/19 1536 81     Resp 05/10/19 1536 17     Temp 05/10/19 1536 99.1 F (37.3 C)     Temp Source 05/10/19 1536 Oral     SpO2 05/10/19 1536 100 %     Weight 05/10/19 1537 239 lb (108.4 kg)     Height 05/10/19 1537 $RemoveBefor'5\' 7"'xEHwsLUkfrst$  (1.702 m)   Constitutional: Alert and oriented. Well appearing and in no acute distress. Eyes: Conjunctivae are normal.  Head: Atraumatic. Nose: No congestion/rhinnorhea. Mouth/Throat: Mucous membranes are moist. Neck: No stridor.  Cardiovascular: Normal rate, regular rhythm. Good peripheral circulation. Grossly normal heart sounds.   Respiratory: Normal respiratory effort.  No retractions. Lungs CTAB. Gastrointestinal: Soft and nontender. No distention.  Musculoskeletal: No lower extremity tenderness nor edema. No gross deformities of extremities. Neurologic:  Normal speech and language. Skin:  Skin is warm, dry and intact. No rash noted.   ____________________________________________   LABS (all labs ordered are listed, but only abnormal results are displayed)  Labs Reviewed  BASIC METABOLIC PANEL - Abnormal; Notable for the following components:      Result Value   Chloride 93 (*)    Glucose, Bld 112 (*)    Creatinine, Ser 5.34 (*)    GFR calc non Af Amer 9 (*)    GFR calc Af Amer 10 (*)    Anion gap 16 (*)    All other components within normal limits  CBC - Abnormal; Notable for the following components:   RBC 3.39 (*)    Hemoglobin 9.7 (*)    HCT 31.5 (*)    All other components within normal limits  D-DIMER, QUANTITATIVE (NOT AT [facility]) - Abnormal; Notable for the following components:   D-Dimer, Quant 1.78 (*)    All other components within normal limits  I-STAT BETA HCG BLOOD, ED ([facility], [facility], [facility] ONLY)  TROPONIN I (HIGH SENSITIVITY)  TROPONIN I (HIGH SENSITIVITY)   ____________________________________________  EKG   EKG Interpretation  Date/Time:  Wednesday May 10 2019 15:23:20  EDT Ventricular Rate:  84 PR Interval:  138 QRS Duration: 86 QT Interval:  408 QTC Calculation: 482 R Axis:   16 Text Interpretation: Normal sinus rhythm Cannot rule out Anterior infarct , age undetermined Abnormal ECG T waves changes inferiorly with some lateral flattening No STEMI Confirmed by Nanda Quinton [phone]) on 05/10/2019 5:56:08 PM       ____________________________________________  RADIOLOGY  DG Chest 2 View  Result Date: 05/10/2019 CLINICAL DATA:  Chest pain and shortness of breath EXAM: CHEST - 2 VIEW COMPARISON:  April 25, 2019 FINDINGS: Central catheter tip is in the right atrium, slightly distal to the cavoatrial junction. No pneumothorax. The lungs are clear. Heart size and pulmonary vascularity are normal. No adenopathy. No bone lesions. IMPRESSION: Central catheter  tip in right atrium. Lungs clear. Cardiac silhouette within normal limits. Electronically Signed   By: Lowella Grip III M.D.   On: 05/10/2019 16:01    ____________________________________________   PROCEDURES  Procedure(s) performed:   .Critical Care Performed by: Margette Fast, MD Authorized by: Margette Fast, MD   Critical care provider statement:    Critical care time (minutes):  35   Critical care time was exclusive of:  Separately billable procedures and treating other patients and teaching time   Critical care was necessary to treat or prevent imminent or life-threatening deterioration of the following conditions:  Cardiac failure   Critical care was time spent personally by me on the following activities:  Blood draw for specimens, development of treatment plan with patient or surrogate, discussions with consultants, evaluation of patient's response to treatment, examination of patient, ordering and performing treatments and interventions, ordering and review of laboratory studies, ordering and review of radiographic studies, pulse oximetry, re-evaluation of patient's condition, review of  old charts and obtaining history from patient or surrogate   I assumed direction of critical care for this patient from another provider in my specialty: no      ____________________________________________   INITIAL IMPRESSION / [place] / ED COURSE  Pertinent labs & imaging results that were available during my care of the patient were reviewed by me and considered in my medical decision making (see chart for details).   Patient presents emergency department evaluation of acute onset chest pressure radiating to the back in the setting of coming out of procedural sedation for HD cath placement.  Chest x-ray here confirms good location of the HD cath in the right atrium.  Patient's initial troponin is 7.  Her EKG has changed from her tracing 2 weeks ago with T wave inversions and some flattening inferior and lateral.  Plan to discuss this with cardiology and add d-dimer.   08:45 PM  Patient's initial troponin is normal.  Her D-dimer is elevated similar to prior.  I spoke with Dr. Doylene Canard regarding the case.  I did not want to send the patient for CTA if he would plan on additional imaging or cath requiring contrast.  Will start the patient on heparin and he will admit to his service.  I have placed a VQ scan order and spoken with the nuclear medicine tech.  He will order a dose of contrast and have her done first thing tomorrow morning at 6:30 - 7 AM.  ____________________________________________  FINAL CLINICAL IMPRESSION(S) / ED DIAGNOSES  Final diagnoses:  Precordial chest pain    MEDICATIONS GIVEN DURING THIS VISIT:  Medications  sodium chloride flush (NS) 0.9 % injection 3 mL (3 mLs Intravenous Given 05/10/19 1835)  morphine 4 MG/ML injection 4 mg (4 mg Intravenous Given 05/10/19 1833)  ondansetron (ZOFRAN) injection 4 mg (4 mg Intravenous Given 05/10/19 1832)     Note:  This document was prepared using Dragon voice recognition software and may include unintentional  dictation errors.  Nanda Quinton, MD, [facility] Emergency Medicine    Charise Leinbach, [facility], MD 05/10/19 2049

## 2019-05-10 NOTE — Progress Notes (Signed)
ANTICOAGULATION CONSULT NOTE - Initial Consult  Pharmacy Consult for Heparin Indication: chest pain/ACS  Allergies  Allergen Reactions  . Nsaids Other (See Comments)    Cannot take due to Kidney disease/Kidney function   . Tolmetin Other (See Comments)    Cannot take due to Kidney Disease    Patient Measurements: Height: 5\' 7"  (170.2 cm) Weight: 108.4 kg (239 lb) IBW/kg (Calculated) : 61.6 Heparin Dosing Weight: 86.4 kg  Vital Signs: Temp: 99.1 F (37.3 C) (05/05 1536) Temp Source: Oral (05/05 1536) BP: 115/75 (05/05 1919) Pulse Rate: 79 (05/05 1919)  Labs: Recent Labs    05/10/19 1536 05/10/19 1835  HGB 9.7*  --   HCT 31.5*  --   PLT 229  --   CREATININE 5.34*  --   TROPONINIHS 7 7    Estimated Creatinine Clearance: 16.9 mL/min (A) (by C-G formula based on SCr of 5.34 mg/dL (H)).   Medical History: Past Medical History:  Diagnosis Date  . Acid reflux   . Anemia 04/2019   REQUIRING BLOOD TRANSFUSIONS  . Anxiety   . Arthritis   . Dyspnea   . Dysrhythmia    tachycardia  . ESRD (end stage renal disease) ([place])    [place]  . GI bleeding 12/2017  . Gout   . Headache(784.0)    "related to chemo; sometimes weekly" (09/12/2013)  . High cholesterol   . History of blood transfusion "a couple"   "related to low counts"  . Hypertension   . Iron deficiency anemia    "get epogen shots q month" (02/20/2014)  . 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 Peritoneal dialysis status ([place])   . Stroke [facility]) 08/15/2017   Assessment: 44 year old female with ESRD on HD-MWF. Presented chest pain after HD catheter placement. Pharmacy consulted to dose heparin.  Patient not on anticoagulation prior to admission. H&H currently 9.7/31.5, PLTs 229.   Goal of Therapy:  Heparin level 0.3-0.7 units/ml Monitor platelets by anticoagulation protocol: Yes   Plan:  Give 4000 units bolus x 1 Start heparin infusion  at 1050 units/hr Continue to monitor H&H and platelets  Check heparin level at 0400 and daily heparin levels     Elanna Bert L. Devin Going, [place] PGY1 Pharmacy Resident [phone] 05/10/19      9:25 PM  Please check AMION for all [place] phone numbers After 10:00 PM, call the [place] [phone]

## 2019-05-10 NOTE — ED Triage Notes (Signed)
Pt bib ems from vascular MD office where she had conscious sedation for changing of her port. Developed anterior chest wall pain worse with inspiration. Given aspirin and NTG pta. VSS with ems.

## 2019-05-10 NOTE — H&P (Signed)
Referring Physician: Nanda Quinton  Patricia [name] is an 44 y.o. female.                       Chief Complaint: Chest pain  HPI: 44 years old black female has chest pressure radiating to back and left shoulder post HD catheter placement procedure. She denis shortness of breath or sweating spell. NTG SL by EMS did not work. She has PMH of acid reflux, ESRD, Gout, hyperlpidemia, hypertension and stroke.  Her troponin levels are normal but they were mildly elevated 2 weeks ago. Her EKG shows increased inferior ischemic changes. Chest x-ray showed normal lungs and heart silhouette. [place] of abdomen showed Gallbladder sludge without acute cholecystitis.  Past Medical History:  Diagnosis Date  . Acid reflux   . Anemia 04/2019   REQUIRING BLOOD TRANSFUSIONS  . Anxiety   . Arthritis   . Dyspnea   . Dysrhythmia    tachycardia  . ESRD (end stage renal disease) ([place])    [place]  . GI bleeding 12/2017  . Gout   . Headache(784.0)    "related to chemo; sometimes weekly" (09/12/2013)  . High cholesterol   . History of blood transfusion "a couple"   "related to low counts"  . Hypertension   . Iron deficiency anemia    "get epogen shots q month" (02/20/2014)  . 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 Peritoneal dialysis status ([place])   . Stroke [facility]) 08/15/2017      Past Surgical History:  Procedure Laterality Date  . ABDOMINAL HYSTERECTOMY  2010   "laparoscopic"  . ANKLE FRACTURE SURGERY Right 1994  . AV FISTULA PLACEMENT Left 04/14/2016   Procedure: ARTERIOVENOUS (AV) FISTULA CREATION;  Surgeon: Angelia Mould, MD;  Location[facility] OR;  Service: Vascular;  Laterality: Left;  . AV FISTULA PLACEMENT Left 08/09/2017   Procedure: INSERTION OF ARTERIOVENOUS (AV) GORE-TEX GRAFT LEFT ARM;  Surgeon: Elam Dutch, MD;  Location: [place];  Service: Vascular;  Laterality: Left;  . [place] REMOVAL Left 08/11/2017   Procedure:  REMOVAL OF ARTERIOVENOUS GORETEX GRAFT ([place]);  Surgeon: Serafina Mitchell, MD;  Location: [facility] OR;  Service: Vascular;  Laterality: Left;  . BIOPSY  09/03/2017   Procedure: BIOPSY;  Surgeon: Otis Brace, MD;  Location: [place];  Service: Gastroenterology;;  . BIOPSY  12/24/2017   Procedure: BIOPSY;  Surgeon: Irving Copas., MD;  Location: [place];  Service: Gastroenterology;;  . CARDIAC CATHETERIZATION  2000's  . COLONOSCOPY WITH PROPOFOL N/A 09/04/2017   Procedure: COLONOSCOPY WITH PROPOFOL;  Surgeon: Ronnette Juniper, MD;  Location: [place];  Service: Gastroenterology;  Laterality: N/A;  . ENTEROSCOPY N/A 12/24/2017   Procedure: ENTEROSCOPY;  Surgeon: Rush Landmark Telford Nab., MD;  Location: [place];  Service: Gastroenterology;  Laterality: N/A;  . ESOPHAGOGASTRODUODENOSCOPY    . ESOPHAGOGASTRODUODENOSCOPY (EGD) WITH PROPOFOL Left 06/04/2017   Procedure: ESOPHAGOGASTRODUODENOSCOPY (EGD) WITH PROPOFOL;  Surgeon: Arta Silence, MD;  Location: [place];  Service: Endoscopy;  Laterality: Left;  . ESOPHAGOGASTRODUODENOSCOPY (EGD) WITH PROPOFOL N/A 09/03/2017   Procedure: ESOPHAGOGASTRODUODENOSCOPY (EGD) WITH PROPOFOL;  Surgeon: Otis Brace, MD;  Location: [place];  Service: Gastroenterology;  Laterality: N/A;  . FISTULA SUPERFICIALIZATION Left 04/02/2017   Procedure: FISTULA SUPERFICIALIZATION LEFT ARM;  Surgeon: Serafina Mitchell, MD;  Location: [place];  Service: Vascular;  Laterality: Left;  . FISTULOGRAM Left 04/07/2016   Procedure: FISTULOGRAM;  Surgeon: Erlene Quan  Dione Plover, MD;  Location: [place];  Service: Vascular;  Laterality: Left;  . FRACTURE SURGERY     right ankle  . GIVENS CAPSULE STUDY N/A 10/29/2017   Procedure: GIVENS CAPSULE STUDY;  Surgeon: Wilford Corner, MD;  Location: [place];  Service: Endoscopy;  Laterality: N/A;  . GIVENS CAPSULE STUDY N/A 12/24/2017   Procedure: GIVENS CAPSULE STUDY;  Surgeon: Irving Copas., MD;   Location: [place];  Service: Gastroenterology;  Laterality: N/A;  . GIVENS CAPSULE STUDY N/A 05/22/2018   Procedure: GIVENS CAPSULE STUDY;  Surgeon: Irving Copas., MD;  Location: [place];  Service: Gastroenterology;  Laterality: N/A;  . INSERTION OF DIALYSIS CATHETER Right 04/07/2016   Procedure: INSERTION OF DIALYSIS CATHETER;  Surgeon: Waynetta Sandy, MD;  Location: [place];  Service: Vascular;  Laterality: Right;  . INSERTION OF DIALYSIS CATHETER Right 04/04/2017   Procedure: INSERTION OF DIALYSIS CATHETER;  Surgeon: Waynetta Sandy, MD;  Location: [place];  Service: Vascular;  Laterality: Right;  . LIGATION OF ARTERIOVENOUS  FISTULA Left 04/14/2016   Procedure: LIGATION OF ARTERIOVENOUS  FISTULA;  Surgeon: Angelia Mould, MD;  Location: [place];  Service: Vascular;  Laterality: Left;  . PATCH ANGIOPLASTY Left 06/19/2016   Procedure: PATCH ANGIOPLASTY LEFT ARTERIOVENOUS FISTULA;  Surgeon: Angelia Mould, MD;  Location: [place];  Service: Vascular;  Laterality: Left;  . POLYPECTOMY  09/04/2017   Procedure: POLYPECTOMY;  Surgeon: Ronnette Juniper, MD;  Location: [facility] ENDOSCOPY;  Service: Gastroenterology;;  . REVISON OF ARTERIOVENOUS FISTULA Left 06/19/2016   Procedure: REVISION SUPERFICIALIZATION OF BRACHIOCEPHALIC ARTERIOVENOUS FISTULA;  Surgeon: Angelia Mould, MD;  Location: [facility] OR;  Service: Vascular;  Laterality: Left;    Family History  Problem Relation Age of Onset  . Hypertension Mother   . Thyroid disease Mother   . Coronary artery disease Father   . Hypertension Father   . Diabetes Father   . Colon cancer Maternal Aunt   . Esophageal cancer Maternal Uncle   . Inflammatory bowel disease Neg Hx   . Liver disease Neg Hx   . Pancreatic cancer Neg Hx   . Rectal cancer Neg Hx   . Stomach cancer Neg Hx    Social History:  reports that she has never smoked. She has never used smokeless tobacco. She reports that she does not drink alcohol or  use drugs.  Allergies:  Allergies  Allergen Reactions  . Nsaids Other (See Comments)    Cannot take due to Kidney disease/Kidney function   . Tolmetin Other (See Comments)    Cannot take due to Kidney Disease    (Not in a hospital admission)   Results for orders placed or performed during the hospital encounter of 05/10/19 (from the past 48 hour(s))  Basic metabolic panel     Status: Abnormal   Collection Time: 05/10/19  3:36 PM  Result Value Ref Range   Sodium 135 135 - 145 mmol/L   Potassium 4.7 3.5 - 5.1 mmol/L   Chloride 93 (L) 98 - 111 mmol/L   CO2 26 22 - 32 mmol/L   Glucose, Bld 112 (H) 70 - 99 mg/dL    Comment: Glucose reference range applies only to samples taken after fasting for at least 8 hours.   BUN 14 6 - 20 mg/dL   Creatinine, Ser 5.34 (H) 0.44 - 1.00 mg/dL   Calcium 9.0 8.9 - 10.3 mg/dL   GFR calc non Af Amer 9 (L) >60 mL/min   GFR calc Af  Amer 10 (L) >60 mL/min   Anion gap 16 (H) 5 - 15    Comment: Performed at [facility], [place] [address] [place], [place] [address]  CBC     Status: Abnormal   Collection Time: 05/10/19  3:36 PM  Result Value Ref Range   WBC 10.0 4.0 - 10.5 K/uL   RBC 3.39 (L) 3.87 - 5.11 MIL/uL   Hemoglobin 9.7 (L) 12.0 - 15.0 g/dL   HCT 31.5 (L) 36.0 - 46.0 %   MCV 92.9 80.0 - 100.0 fL   MCH 28.6 26.0 - 34.0 pg   MCHC 30.8 30.0 - 36.0 g/dL   RDW 15.3 11.5 - 15.5 %   Platelets 229 150 - 400 K/uL   nRBC 0.2 0.0 - 0.2 %    Comment: Performed at [facility], [place] [address], [place], [place] [address]  Troponin I (High Sensitivity)     Status: None   Collection Time: 05/10/19  3:36 PM  Result Value Ref Range   Troponin I (High Sensitivity) 7 <18 ng/L    Comment: (NOTE) Elevated high sensitivity troponin I (hsTnI) values and significant  changes across serial measurements may suggest ACS but many other  chronic and acute conditions are known to elevate hsTnI results.  Refer to the "Links" section for chest pain  algorithms and additional  guidance. Performed at [facility], [place] [address], [place], [place] [address]   I-Stat beta hCG blood, ED     Status: None   Collection Time: 05/10/19  3:54 PM  Result Value Ref Range   I-stat hCG, quantitative <5.0 <5 mIU/mL   Comment 3            Comment:   GEST. AGE      CONC.  (mIU/mL)   <=1 WEEK        5 - 50     2 WEEKS       50 - 500     3 WEEKS       100 - 10,000     4 WEEKS     1,000 - 30,000        FEMALE AND NON-PREGNANT FEMALE:     LESS THAN 5 mIU/mL   Troponin I (High Sensitivity)     Status: None   Collection Time: 05/10/19  6:35 PM  Result Value Ref Range   Troponin I (High Sensitivity) 7 <18 ng/L    Comment: (NOTE) Elevated high sensitivity troponin I (hsTnI) values and significant  changes across serial measurements may suggest ACS but many other  chronic and acute conditions are known to elevate hsTnI results.  Refer to the "Links" section for chest pain algorithms and additional  guidance. Performed at [facility], [place] [address] [place], [place] [address]   D-dimer, quantitative (not at [facility])     Status: Abnormal   Collection Time: 05/10/19  7:10 PM  Result Value Ref Range   D-Dimer, Quant 1.78 (H) 0.00 - 0.50 ug/mL-FEU    Comment: (NOTE) At the manufacturer cut-off of 0.50 ug/mL FEU, this assay has been documented to exclude PE with a sensitivity and negative predictive value of 97 to 99%.  At this time, this assay has not been approved by the FDA to exclude DVT/VTE. Results should be correlated with clinical presentation. Performed at [facility], [place] [address], [place],  [address]    DG Chest 2 View  Result Date: 05/10/2019 CLINICAL  DATA:  Chest pain and shortness of breath EXAM: CHEST - 2 VIEW COMPARISON:  April 25, 2019 FINDINGS: Central catheter tip is in the right atrium, slightly distal to the cavoatrial junction. No pneumothorax. The lungs are clear. Heart size and pulmonary vascularity  are normal. No adenopathy. No bone lesions. IMPRESSION: Central catheter tip in right atrium. Lungs clear. Cardiac silhouette within normal limits. Electronically Signed   By: Lowella Grip III M.D.   On: 05/10/2019 16:01    Review Of Systems Constitutional: No fever, chills, weight loss or gain. Eyes: No vision change, wears glasses. No discharge or pain. Ears: No hearing loss, No tinnitus. Respiratory: No asthma, COPD, pneumonias. No shortness of breath. No hemoptysis. Cardiovascular: Positive chest pain, no palpitation, leg edema. Gastrointestinal: No nausea, vomiting, diarrhea, constipation. No GI bleed. No hepatitis. Genitourinary: No dysuria, hematuria, kidney stone. No incontinance. Neurological: No headache, positive stroke, seizures.  Psychiatry: No psych facility admission for anxiety, depression, suicide. No detox. Skin: No rash. Musculoskeletal: No joint pain, fibromyalgia. No neck pain, back pain. Lymphadenopathy: No lymphadenopathy. Hematology: Positive anemia or easy bruising.   Blood pressure 115/75, pulse 79, temperature 99.1 F (37.3 C), temperature source Oral, resp. rate 15, height $RemoveBe'5\' 7"'[id number]$  (1.702 m), weight 108.4 kg, SpO2 99 %. Body mass index is 37.43 kg/m. General appearance: alert, cooperative, appears stated age and no distress Head: Normocephalic, atraumatic. Eyes: Brown eyes, pink conjunctiva, corneas clear. PERRL, EOM's intact. Neck: No adenopathy, no carotid bruit, no JVD, supple, symmetrical, trachea midline and thyroid not enlarged. Resp: Clear to auscultation bilaterally. Cardio: Regular rate and rhythm, S1, S2 normal, II/VI systolic murmur, no click, rub or gallop GI: Soft, non-tender; bowel sounds normal; no organomegaly. Extremities: No edema, cyanosis or clubbing. Right IJ TDC. Skin: Warm and dry.  Neurologic: Alert and oriented X 3, normal strength.  Assessment/Plan Acute coronary syndrome HTN Hyperlipidemia Obesity S/P  stroke Gout  Admit NM myocardial perfusion stress test.  Time spent: Review of old records, Lab, x-rays, EKG, other cardiac tests, examination, discussion with patient and ER doctor over 60 minutes.  [name], MD  05/10/2019, 9:49 PM

## 2019-05-11 ENCOUNTER — Inpatient Hospital Stay (HOSPITAL_COMMUNITY): Payer: Medicare Other

## 2019-05-11 LAB — RESPIRATORY PANEL BY RT PCR (FLU A&B, COVID)
Influenza A by PCR: NEGATIVE
Influenza B by PCR: NEGATIVE
SARS Coronavirus 2 by RT PCR: NEGATIVE

## 2019-05-11 LAB — BASIC METABOLIC PANEL
Anion gap: 16 — ABNORMAL HIGH (ref 5–15)
BUN: 26 mg/dL — ABNORMAL HIGH (ref 6–20)
CO2: 26 mmol/L (ref 22–32)
Calcium: 9.5 mg/dL (ref 8.9–10.3)
Chloride: 90 mmol/L — ABNORMAL LOW (ref 98–111)
Creatinine, Ser: 7.19 mg/dL — ABNORMAL HIGH (ref 0.44–1.00)
GFR calc Af Amer: 7 mL/min — ABNORMAL LOW (ref >60)
GFR calc non Af Amer: 6 mL/min — ABNORMAL LOW (ref >60)
Glucose, Bld: 162 mg/dL — ABNORMAL HIGH (ref 70–99)
Potassium: 6.2 mmol/L — ABNORMAL HIGH (ref 3.5–5.1)
Sodium: 132 mmol/L — ABNORMAL LOW (ref 135–145)

## 2019-05-11 LAB — CBC WITH DIFFERENTIAL/PLATELET
Abs Immature Granulocytes: 0.06 10*3/uL (ref 0.00–0.07)
Basophils Absolute: 0 10*3/uL (ref 0.0–0.1)
Basophils Relative: 0 %
Eosinophils Absolute: 0 10*3/uL (ref 0.0–0.5)
Eosinophils Relative: 0 %
HCT: 29 % — ABNORMAL LOW (ref 36.0–46.0)
Hemoglobin: 9.1 g/dL — ABNORMAL LOW (ref 12.0–15.0)
Immature Granulocytes: 1 %
Lymphocytes Relative: 13 %
Lymphs Abs: 1.4 10*3/uL (ref 0.7–4.0)
MCH: 28.3 pg (ref 26.0–34.0)
MCHC: 31.4 g/dL (ref 30.0–36.0)
MCV: 90.3 fL (ref 80.0–100.0)
Monocytes Absolute: 0.6 10*3/uL (ref 0.1–1.0)
Monocytes Relative: 5 %
Neutro Abs: 8.4 10*3/uL — ABNORMAL HIGH (ref 1.7–7.7)
Neutrophils Relative %: 81 %
Platelets: 237 10*3/uL (ref 150–400)
RBC: 3.21 MIL/uL — ABNORMAL LOW (ref 3.87–5.11)
RDW: 14.8 % (ref 11.5–15.5)
WBC: 10.4 10*3/uL (ref 4.0–10.5)
nRBC: 0 % (ref 0.0–0.2)

## 2019-05-11 LAB — LIPASE, BLOOD: Lipase: 34 U/L (ref 11–51)

## 2019-05-11 LAB — PROTIME-INR
INR: 1.1 (ref 0.8–1.2)
Prothrombin Time: 13.4 seconds (ref 11.4–15.2)

## 2019-05-11 LAB — LIPID PANEL
Cholesterol: 199 mg/dL (ref 0–200)
HDL: 37 mg/dL — ABNORMAL LOW (ref >40)
LDL Cholesterol: 135 mg/dL — ABNORMAL HIGH (ref 0–99)
Total CHOL/HDL Ratio: 5.4 RATIO
Triglycerides: 136 mg/dL (ref <150)
VLDL: 27 mg/dL (ref 0–40)

## 2019-05-11 LAB — HEPARIN LEVEL (UNFRACTIONATED)
Heparin Unfractionated: 0.19 IU/mL — ABNORMAL LOW (ref 0.30–0.70)
Heparin Unfractionated: 0.37 IU/mL (ref 0.30–0.70)

## 2019-05-11 MED ORDER — REGADENOSON 0.4 MG/5ML IV SOLN
INTRAVENOUS | Status: AC
  Filled 2019-05-11: qty 5

## 2019-05-11 MED ORDER — ATORVASTATIN CALCIUM 40 MG PO TABS
40.0000 mg | ORAL_TABLET | Freq: Every day | ORAL | Status: DC
  Administered 2019-05-11 – 2019-05-12 (×2): 40 mg via ORAL
  Filled 2019-05-11 (×2): qty 1

## 2019-05-11 MED ORDER — SODIUM CHLORIDE 0.9% FLUSH
3.0000 mL | Freq: Two times a day (BID) | INTRAVENOUS | Status: DC
  Administered 2019-05-11: 3 mL via INTRAVENOUS

## 2019-05-11 MED ORDER — RENA-VITE PO TABS
1.0000 | ORAL_TABLET | Freq: Every day | ORAL | Status: DC
  Administered 2019-05-11 – 2019-05-13 (×2): 1 via ORAL
  Filled 2019-05-11 (×2): qty 1

## 2019-05-11 MED ORDER — OXYCODONE-ACETAMINOPHEN 5-325 MG PO TABS
1.0000 | ORAL_TABLET | Freq: Four times a day (QID) | ORAL | Status: DC | PRN
  Administered 2019-05-11 (×2): 1 via ORAL
  Filled 2019-05-11 (×2): qty 1

## 2019-05-11 MED ORDER — TECHNETIUM TC 99M TETROFOSMIN IV KIT
10.8100 | PACK | Freq: Once | INTRAVENOUS | Status: AC | PRN
  Administered 2019-05-11: 10.81 via INTRAVENOUS

## 2019-05-11 MED ORDER — REGADENOSON 0.4 MG/5ML IV SOLN
0.4000 mg | Freq: Once | INTRAVENOUS | Status: AC
  Administered 2019-05-11: 0.4 mg via INTRAVENOUS

## 2019-05-11 MED ORDER — DIPHENHYDRAMINE HCL 25 MG PO CAPS
25.0000 mg | ORAL_CAPSULE | Freq: Once | ORAL | Status: AC
  Administered 2019-05-11: 25 mg via ORAL
  Filled 2019-05-11: qty 1

## 2019-05-11 MED ORDER — HYDROXYZINE HCL 10 MG PO TABS
10.0000 mg | ORAL_TABLET | ORAL | Status: AC
  Administered 2019-05-11: 10 mg via ORAL
  Filled 2019-05-11: qty 1

## 2019-05-11 MED ORDER — TECHNETIUM TC 99M TETROFOSMIN IV KIT
29.6000 | PACK | Freq: Once | INTRAVENOUS | Status: AC | PRN
  Administered 2019-05-11: 29.6 via INTRAVENOUS

## 2019-05-11 MED ORDER — SODIUM ZIRCONIUM CYCLOSILICATE 10 G PO PACK
10.0000 g | PACK | Freq: Once | ORAL | Status: AC
  Administered 2019-05-11: 10 g via ORAL
  Filled 2019-05-11: qty 1

## 2019-05-11 MED ORDER — SODIUM CHLORIDE 0.9 % IV SOLN: INTRAVENOUS | Status: DC

## 2019-05-11 MED ORDER — ATORVASTATIN CALCIUM 40 MG PO TABS: 40.0000 mg | ORAL_TABLET | Freq: Every day | ORAL | Status: DC

## 2019-05-11 MED ORDER — SODIUM CHLORIDE 0.9% FLUSH: 3.0000 mL | INTRAVENOUS | Status: DC | PRN

## 2019-05-11 MED ORDER — SODIUM CHLORIDE 0.9 % IV SOLN: 250.0000 mL | INTRAVENOUS | Status: DC | PRN

## 2019-05-11 NOTE — ED Notes (Signed)
MD at bedside. 

## 2019-05-11 NOTE — ED Notes (Signed)
Paged Admitting Dr. Per RN 

## 2019-05-11 NOTE — Progress Notes (Signed)
[place] for Heparin Indication: chest pain/ACS  Allergies  Allergen Reactions  . Nsaids Other (See Comments)    Cannot take due to Kidney disease/Kidney function   . Tolmetin Other (See Comments)    Cannot take due to Kidney Disease    Patient Measurements: Height: 5\' 7"  (170.2 cm) Weight: 108.4 kg (239 lb) IBW/kg (Calculated) : 61.6 Heparin Dosing Weight: 86.4 kg  Vital Signs: BP: 118/79 (05/06 0556) Pulse Rate: 95 (05/06 0556)  Labs: Recent Labs    05/10/19 1536 05/10/19 1835 05/11/19 0515  HGB 9.7*  --  9.1*  HCT 31.5*  --  29.0*  PLT 229  --  237  LABPROT  --   --  13.4  INR  --   --  1.1  HEPARINUNFRC  --   --  0.19*  CREATININE 5.34*  --   --   TROPONINIHS 7 7  --     Estimated Creatinine Clearance: 16.9 mL/min (A) (by C-G formula based on SCr of 5.34 mg/dL (H)).   Medical History: Past Medical History:  Diagnosis Date  . Acid reflux   . Anemia 04/2019   REQUIRING BLOOD TRANSFUSIONS  . Anxiety   . Arthritis   . Dyspnea   . Dysrhythmia    tachycardia  . ESRD (end stage renal disease) ([place])    [place]  . GI bleeding 12/2017  . Gout   . Headache(784.0)    "related to chemo; sometimes weekly" (09/12/2013)  . High cholesterol   . History of blood transfusion "a couple"   "related to low counts"  . Hypertension   . Iron deficiency anemia    "get epogen shots q month" (02/20/2014)  . 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 Peritoneal dialysis status ([place])   . Stroke [facility]) 08/15/2017   Assessment: 44 year old female with ESRD on HD-MWF. Presented chest pain after HD catheter placement. Pharmacy consulted to dose heparin.  Patient not on anticoagulation prior to admission. H&H currently 9.7/31.5, PLTs 229.   5/6 AM update:  Heparin level low  Goal of Therapy:  Heparin level 0.3-0.7 units/ml Monitor platelets by anticoagulation  protocol: Yes   Plan:  Inc heparin to 1250 units/hr 1500 heparin level  Narda Bonds, PharmD, BCPS Clinical Pharmacist Phone: [phone]

## 2019-05-11 NOTE — Progress Notes (Signed)
[place] for Heparin Indication: chest pain/ACS  Allergies  Allergen Reactions  . Nsaids Other (See Comments)    Cannot take due to Kidney disease/Kidney function   . Tolmetin Other (See Comments)    Cannot take due to Kidney Disease    Patient Measurements: Height: 5\' 7"  (170.2 cm) Weight: 108.4 kg (239 lb) IBW/kg (Calculated) : 61.6 Heparin Dosing Weight: 86.4 kg  Vital Signs: Temp: 99 F (37.2 C) (05/06 1431) Temp Source: Oral (05/06 1431) BP: 134/83 (05/06 1431) Pulse Rate: 99 (05/06 1431)  Labs: Recent Labs    05/10/19 1536 05/10/19 1835 05/11/19 0515 05/11/19 1443  HGB 9.7*  --  9.1*  --   HCT 31.5*  --  29.0*  --   PLT 229  --  237  --   LABPROT  --   --  13.4  --   INR  --   --  1.1  --   HEPARINUNFRC  --   --  0.19* 0.37  CREATININE 5.34*  --  7.19*  --   TROPONINIHS 7 7  --   --     Estimated Creatinine Clearance: 12.5 mL/min (A) (by C-G formula based on SCr of 7.19 mg/dL (H)).   Medical History: Past Medical History:  Diagnosis Date  . Acid reflux   . Anemia 04/2019   REQUIRING BLOOD TRANSFUSIONS  . Anxiety   . Arthritis   . Dyspnea   . Dysrhythmia    tachycardia  . ESRD (end stage renal disease) ([place])    [place]  . GI bleeding 12/2017  . Gout   . Headache(784.0)    "related to chemo; sometimes weekly" (09/12/2013)  . High cholesterol   . History of blood transfusion "a couple"   "related to low counts"  . Hypertension   . Iron deficiency anemia    "get epogen shots q month" (02/20/2014)  . 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 Peritoneal dialysis status ([place])   . Stroke [facility]) 08/15/2017   Assessment: 44 year old female with ESRD on HD-MWF. Presented chest pain after HD catheter placement. Pharmacy consulted to dose heparin. No AC PTA.   Heparin level this afternoon came back therapeutic at 0.37, on 1250 units/hr. Hgb 9.1,  plt 237. No s/sx of bleeding. D-dimer 1.78.   Goal of Therapy:  Heparin level 0.3-0.7 units/ml Monitor platelets by anticoagulation protocol: Yes   Plan:  Continue heparin infusion at 1250 units/hr Monitor daily HL, CBC, and for s/sx of bleeding   Antonietta Jewel, PharmD, [place] Pharmacist  Phone: [phone] 05/11/2019 3:22 PM  Please check AMION for all [place] phone numbers After 10:00 PM, call [place] [phone]

## 2019-05-11 NOTE — ED Notes (Signed)
Pt back to room from nuc med

## 2019-05-11 NOTE — ED Notes (Signed)
Received call from attending MD- chest scan to be completed first- informed nuclear med. Will keep pt NPO.

## 2019-05-11 NOTE — Progress Notes (Signed)
   05/11/19 1536  Pain Assessment  Pain Scale 0-10  Pain Score 4  Patients Stated Pain Goal 2  Pain Type Acute pain  Pain Location Chest  Pain Orientation Left;Mid  Pain Descriptors / Indicators Aching;Discomfort  Pain Intervention(s) Medication (See eMAR)   Notified MD Doylene Canard. Got repeat EKG and gave PRN pain medication. I will continue to watch the patient closely.   Saddie Benders RN

## 2019-05-11 NOTE — Progress Notes (Signed)
Ref: Lindley Magnus, PA-C   Subjective:  Mild to moderate scarring in septal wall and lateral wall and inferior wall reversible ischemia on NM myocardial perfusion test but echocardiogram last month showed normal LV systolic function with 55-60 % EF. Patient has mild itching due to ESRD or Oxycodone use. VS stable.  Objective:  Vital Signs in the last 24 hours: Temp:  [98.2 F (36.8 C)-99.3 F (37.4 C)] 98.5 F (36.9 C) (05/06 1625) Pulse Rate:  [76-105] 83 (05/06 1625) Cardiac Rhythm: Normal sinus rhythm (05/06 1500) Resp:  [15-25] 18 (05/06 1339) BP: (115-149)/(69-98) 141/83 (05/06 1625) SpO2:  [93 %-100 %] 100 % (05/06 1625)  Physical Exam: BP Readings from Last 1 Encounters:  05/11/19 (!) 141/83     Wt Readings from Last 1 Encounters:  05/10/19 108.4 kg    Weight change:  Body mass index is 37.43 kg/m. HEENT: [place]/AT, Eyes-Brown, Conjunctiva-Pink, Sclera-Non-icteric Neck: No JVD, No bruit, Trachea midline. Lungs:  Clear, Bilateral. Cardiac:  Regular rhythm, normal S1 and S2, no S3. II/VI systolic murmur. Abdomen:  Soft, non-tender. BS present. Extremities:  No edema present. No cyanosis. No clubbing. 2 + bilateral radial pulses. Deep bilateral Femoral pulses CNS: AxOx3, Cranial nerves grossly intact, moves all 4 extremities.  Skin: Warm and dry.   Intake/Output from previous day: No intake/output data recorded.    Lab Results: BMET    Component Value Date/Time   NA 132 (L) 05/11/2019 0515   NA 135 05/10/2019 1536   NA 136 04/28/2019 0704   K 6.2 (H) 05/11/2019 0515   K 4.7 05/10/2019 1536   K 4.2 04/28/2019 0704   CL 90 (L) 05/11/2019 0515   CL 93 (L) 05/10/2019 1536   CL 95 (L) 04/28/2019 0704   CO2 26 05/11/2019 0515   CO2 26 05/10/2019 1536   CO2 25 04/28/2019 0704   GLUCOSE 162 (H) 05/11/2019 0515   GLUCOSE 112 (H) 05/10/2019 1536   GLUCOSE 113 (H) 04/28/2019 0704   BUN 26 (H) 05/11/2019 0515   BUN 14 05/10/2019 1536   BUN 39 (H) 04/28/2019 0704   CREATININE 7.19 (H) 05/11/2019 0515   CREATININE 5.34 (H) 05/10/2019 1536   CREATININE 11.71 (H) 04/28/2019 0704   CALCIUM 9.5 05/11/2019 0515   CALCIUM 9.0 05/10/2019 1536   CALCIUM 10.2 04/28/2019 0704   GFRNONAA 6 (L) 05/11/2019 0515   GFRNONAA 9 (L) 05/10/2019 1536   GFRNONAA 3 (L) 04/28/2019 0704   GFRAA 7 (L) 05/11/2019 0515   GFRAA 10 (L) 05/10/2019 1536   GFRAA 4 (L) 04/28/2019 0704   CBC    Component Value Date/Time   WBC 10.4 05/11/2019 0515   RBC 3.21 (L) 05/11/2019 0515   HGB 9.1 (L) 05/11/2019 0515   HCT 29.0 (L) 05/11/2019 0515   HCT 27.5 (L) 04/05/2018 0943   PLT 237 05/11/2019 0515   MCV 90.3 05/11/2019 0515   MCH 28.3 05/11/2019 0515   MCHC 31.4 05/11/2019 0515   RDW 14.8 05/11/2019 0515   LYMPHSABS 1.4 05/11/2019 0515   MONOABS 0.6 05/11/2019 0515   EOSABS 0.0 05/11/2019 0515   BASOSABS 0.0 05/11/2019 0515   HEPATIC Function Panel Recent Labs    08/23/18 1237 04/27/19 0820 05/10/19 2136  PROT 7.6 6.5 7.5   HEMOGLOBIN A1C No components found for: HGA1C,  MPG CARDIAC ENZYMES Lab Results  Component Value Date   CKTOTAL 257 (H) 04/28/2015   CKMB 2.5 07/04/2011   TROPONINI <0.03 05/11/2016   TROPONINI <0.03 04/14/2016  TROPONINI <0.03 04/13/2016   BNP No results for input(s): PROBNP in the last 8760 hours. TSH Recent Labs    08/23/18 2115 04/25/19 1222  TSH 0.996 1.702   CHOLESTEROL Recent Labs    05/11/19 0515  CHOL 199    Scheduled Meds: . allopurinol  100 mg Oral QHS  . amLODipine  10 mg Oral Daily  . atorvastatin  40 mg Oral q1800  . calcitRIOL  0.5 mcg Oral Q breakfast  . gabapentin  100 mg Oral Daily  . hydrALAZINE  50 mg Oral Q8H  . metoprolol tartrate  50 mg Oral QHS  . multivitamin  1 tablet Oral QHS  . pantoprazole  40 mg Oral Daily  . regadenoson      . sodium chloride flush  3 mL Intravenous Q12H   Continuous Infusions: . sodium chloride Stopped (05/11/19 0052)  . sodium chloride    . sodium chloride    .  heparin 1,250 Units/hr (05/11/19 0620)   PRN Meds:.sodium chloride, ALPRAZolam, oxyCODONE-acetaminophen, promethazine, sodium chloride flush  Assessment/Plan: Acute coronary syndrome HTN ESRD Hyperlipidemia Obesity Hyperkalemia S/P stroke Gout  Cardiac catheterization in AM. Patient understood procedure, risks and alternatives and agrees to the procedure in AM.   LOS: 1 day   Time spent including chart review, lab review, examination, discussion with patient, family and nurse : 49 min   Dixie Dials  MD  05/11/2019, 6:26 PM

## 2019-05-11 NOTE — ED Notes (Signed)
Reached out to nuclear med r/t two studies ordered. Informed pt must be NPO for chest scan- and must wait 48 hours in between scans, unknown which scan MD wants first at this time, will keep pt NPO- Network engineer to page attending MD.

## 2019-05-11 NOTE — ED Notes (Signed)
Pt transported to nuclear med.

## 2019-05-11 NOTE — ED Notes (Signed)
Lunch Tray Ordered @ 1042. 

## 2019-05-11 NOTE — ED Notes (Signed)
Pt currently refusing second PIV Insertion. This RN made this pt aware that if the primary PIV is compromised, another PIV will have to be placed.

## 2019-05-11 NOTE — ED Notes (Signed)
Attempted to call nursing report to [phone]

## 2019-05-11 NOTE — H&P (View-Only) (Signed)
Ref: Lindley Magnus, PA-C   Subjective:  Mild to moderate scarring in septal wall and lateral wall and inferior wall reversible ischemia on NM myocardial perfusion test but echocardiogram last month showed normal LV systolic function with 55-60 % EF. Patient has mild itching due to ESRD or Oxycodone use. VS stable.  Objective:  Vital Signs in the last 24 hours: Temp:  [98.2 F (36.8 C)-99.3 F (37.4 C)] 98.5 F (36.9 C) (05/06 1625) Pulse Rate:  [76-105] 83 (05/06 1625) Cardiac Rhythm: Normal sinus rhythm (05/06 1500) Resp:  [15-25] 18 (05/06 1339) BP: (115-149)/(69-98) 141/83 (05/06 1625) SpO2:  [93 %-100 %] 100 % (05/06 1625)  Physical Exam: BP Readings from Last 1 Encounters:  05/11/19 (!) 141/83     Wt Readings from Last 1 Encounters:  05/10/19 108.4 kg    Weight change:  Body mass index is 37.43 kg/m. HEENT: [place]/AT, Eyes-Brown, Conjunctiva-Pink, Sclera-Non-icteric Neck: No JVD, No bruit, Trachea midline. Lungs:  Clear, Bilateral. Cardiac:  Regular rhythm, normal S1 and S2, no S3. II/VI systolic murmur. Abdomen:  Soft, non-tender. BS present. Extremities:  No edema present. No cyanosis. No clubbing. 2 + bilateral radial pulses. Deep bilateral Femoral pulses CNS: AxOx3, Cranial nerves grossly intact, moves all 4 extremities.  Skin: Warm and dry.   Intake/Output from previous day: No intake/output data recorded.    Lab Results: BMET    Component Value Date/Time   NA 132 (L) 05/11/2019 0515   NA 135 05/10/2019 1536   NA 136 04/28/2019 0704   K 6.2 (H) 05/11/2019 0515   K 4.7 05/10/2019 1536   K 4.2 04/28/2019 0704   CL 90 (L) 05/11/2019 0515   CL 93 (L) 05/10/2019 1536   CL 95 (L) 04/28/2019 0704   CO2 26 05/11/2019 0515   CO2 26 05/10/2019 1536   CO2 25 04/28/2019 0704   GLUCOSE 162 (H) 05/11/2019 0515   GLUCOSE 112 (H) 05/10/2019 1536   GLUCOSE 113 (H) 04/28/2019 0704   BUN 26 (H) 05/11/2019 0515   BUN 14 05/10/2019 1536   BUN 39 (H) 04/28/2019 0704   CREATININE 7.19 (H) 05/11/2019 0515   CREATININE 5.34 (H) 05/10/2019 1536   CREATININE 11.71 (H) 04/28/2019 0704   CALCIUM 9.5 05/11/2019 0515   CALCIUM 9.0 05/10/2019 1536   CALCIUM 10.2 04/28/2019 0704   GFRNONAA 6 (L) 05/11/2019 0515   GFRNONAA 9 (L) 05/10/2019 1536   GFRNONAA 3 (L) 04/28/2019 0704   GFRAA 7 (L) 05/11/2019 0515   GFRAA 10 (L) 05/10/2019 1536   GFRAA 4 (L) 04/28/2019 0704   CBC    Component Value Date/Time   WBC 10.4 05/11/2019 0515   RBC 3.21 (L) 05/11/2019 0515   HGB 9.1 (L) 05/11/2019 0515   HCT 29.0 (L) 05/11/2019 0515   HCT 27.5 (L) 04/05/2018 0943   PLT 237 05/11/2019 0515   MCV 90.3 05/11/2019 0515   MCH 28.3 05/11/2019 0515   MCHC 31.4 05/11/2019 0515   RDW 14.8 05/11/2019 0515   LYMPHSABS 1.4 05/11/2019 0515   MONOABS 0.6 05/11/2019 0515   EOSABS 0.0 05/11/2019 0515   BASOSABS 0.0 05/11/2019 0515   HEPATIC Function Panel Recent Labs    08/23/18 1237 04/27/19 0820 05/10/19 2136  PROT 7.6 6.5 7.5   HEMOGLOBIN A1C No components found for: HGA1C,  MPG CARDIAC ENZYMES Lab Results  Component Value Date   CKTOTAL 257 (H) 04/28/2015   CKMB 2.5 07/04/2011   TROPONINI <0.03 05/11/2016   TROPONINI <0.03 04/14/2016  TROPONINI <0.03 04/13/2016   BNP No results for input(s): PROBNP in the last 8760 hours. TSH Recent Labs    08/23/18 2115 04/25/19 1222  TSH 0.996 1.702   CHOLESTEROL Recent Labs    05/11/19 0515  CHOL 199    Scheduled Meds: . allopurinol  100 mg Oral QHS  . amLODipine  10 mg Oral Daily  . atorvastatin  40 mg Oral q1800  . calcitRIOL  0.5 mcg Oral Q breakfast  . gabapentin  100 mg Oral Daily  . hydrALAZINE  50 mg Oral Q8H  . metoprolol tartrate  50 mg Oral QHS  . multivitamin  1 tablet Oral QHS  . pantoprazole  40 mg Oral Daily  . regadenoson      . sodium chloride flush  3 mL Intravenous Q12H   Continuous Infusions: . sodium chloride Stopped (05/11/19 0052)  . sodium chloride    . sodium chloride    .  heparin 1,250 Units/hr (05/11/19 0620)   PRN Meds:.sodium chloride, ALPRAZolam, oxyCODONE-acetaminophen, promethazine, sodium chloride flush  Assessment/Plan: Acute coronary syndrome HTN ESRD Hyperlipidemia Obesity Hyperkalemia S/P stroke Gout  Cardiac catheterization in AM. Patient understood procedure, risks and alternatives and agrees to the procedure in AM.   LOS: 1 day   Time spent including chart review, lab review, examination, discussion with patient, family and nurse : 38 min   Dixie Dials  MD  05/11/2019, 6:26 PM

## 2019-05-12 ENCOUNTER — Encounter (HOSPITAL_COMMUNITY)
Admission: EM | Disposition: A | Discharge status: Home / Self Care | Payer: Self-pay | Source: Ambulatory Visit | Attending: Cardiovascular Disease

## 2019-05-12 ENCOUNTER — Inpatient Hospital Stay (HOSPITAL_COMMUNITY): Payer: Medicare Other

## 2019-05-12 HISTORY — PX: LEFT HEART CATH AND CORONARY ANGIOGRAPHY: CATH118249

## 2019-05-12 LAB — BASIC METABOLIC PANEL
Anion gap: 15 (ref 5–15)
BUN: 53 mg/dL — ABNORMAL HIGH (ref 6–20)
CO2: 26 mmol/L (ref 22–32)
Calcium: 9 mg/dL (ref 8.9–10.3)
Chloride: 91 mmol/L — ABNORMAL LOW (ref 98–111)
Creatinine, Ser: 10.4 mg/dL — ABNORMAL HIGH (ref 0.44–1.00)
GFR calc Af Amer: 5 mL/min — ABNORMAL LOW (ref >60)
GFR calc non Af Amer: 4 mL/min — ABNORMAL LOW (ref >60)
Glucose, Bld: 99 mg/dL (ref 70–99)
Potassium: 5.6 mmol/L — ABNORMAL HIGH (ref 3.5–5.1)
Sodium: 132 mmol/L — ABNORMAL LOW (ref 135–145)

## 2019-05-12 LAB — CBC WITH DIFFERENTIAL/PLATELET
Abs Immature Granulocytes: 0.1 10*3/uL — ABNORMAL HIGH (ref 0.00–0.07)
Basophils Absolute: 0 10*3/uL (ref 0.0–0.1)
Basophils Relative: 0 %
Eosinophils Absolute: 0.1 10*3/uL (ref 0.0–0.5)
Eosinophils Relative: 1 %
HCT: 28 % — ABNORMAL LOW (ref 36.0–46.0)
Hemoglobin: 8.7 g/dL — ABNORMAL LOW (ref 12.0–15.0)
Immature Granulocytes: 1 %
Lymphocytes Relative: 23 %
Lymphs Abs: 3 10*3/uL (ref 0.7–4.0)
MCH: 27.9 pg (ref 26.0–34.0)
MCHC: 31.1 g/dL (ref 30.0–36.0)
MCV: 89.7 fL (ref 80.0–100.0)
Monocytes Absolute: 0.9 10*3/uL (ref 0.1–1.0)
Monocytes Relative: 7 %
Neutro Abs: 9.1 10*3/uL — ABNORMAL HIGH (ref 1.7–7.7)
Neutrophils Relative %: 68 %
Platelets: 274 10*3/uL (ref 150–400)
RBC: 3.12 MIL/uL — ABNORMAL LOW (ref 3.87–5.11)
RDW: 15.1 % (ref 11.5–15.5)
WBC: 13.3 10*3/uL — ABNORMAL HIGH (ref 4.0–10.5)
nRBC: 0.1 % (ref 0.0–0.2)

## 2019-05-12 LAB — HEPARIN LEVEL (UNFRACTIONATED): Heparin Unfractionated: 0.4 IU/mL (ref 0.30–0.70)

## 2019-05-12 SURGERY — LEFT HEART CATH AND CORONARY ANGIOGRAPHY
Anesthesia: LOCAL

## 2019-05-12 MED ORDER — SODIUM CHLORIDE 0.9 % IV SOLN: 250.0000 mL | INTRAVENOUS | Status: DC | PRN

## 2019-05-12 MED ORDER — HEPARIN (PORCINE) IN NACL 1000-0.9 UT/500ML-% IV SOLN
INTRAVENOUS | Status: DC | PRN
  Administered 2019-05-12 (×2): 500 mL

## 2019-05-12 MED ORDER — MIDAZOLAM HCL 2 MG/2ML IJ SOLN
INTRAMUSCULAR | Status: AC
  Filled 2019-05-12: qty 2

## 2019-05-12 MED ORDER — HYDRALAZINE HCL 20 MG/ML IJ SOLN: 10.0000 mg | INTRAMUSCULAR | Status: AC | PRN

## 2019-05-12 MED ORDER — CHLORHEXIDINE GLUCONATE CLOTH 2 % EX PADS: 6.0000 | MEDICATED_PAD | Freq: Every day | CUTANEOUS | Status: DC

## 2019-05-12 MED ORDER — LABETALOL HCL 5 MG/ML IV SOLN: 10.0000 mg | INTRAVENOUS | Status: AC | PRN

## 2019-05-12 MED ORDER — LIDOCAINE HCL (PF) 1 % IJ SOLN
INTRAMUSCULAR | Status: AC
  Filled 2019-05-12: qty 30

## 2019-05-12 MED ORDER — CHLORHEXIDINE GLUCONATE CLOTH 2 % EX PADS
6.0000 | MEDICATED_PAD | Freq: Every day | CUTANEOUS | Status: DC
  Administered 2019-05-13: 6 via TOPICAL

## 2019-05-12 MED ORDER — HEPARIN SODIUM (PORCINE) 1000 UNIT/ML IJ SOLN
INTRAMUSCULAR | Status: AC
  Filled 2019-05-12: qty 1

## 2019-05-12 MED ORDER — SODIUM CHLORIDE 0.9% FLUSH: 3.0000 mL | INTRAVENOUS | Status: DC | PRN

## 2019-05-12 MED ORDER — SODIUM CHLORIDE 0.9% FLUSH
3.0000 mL | Freq: Two times a day (BID) | INTRAVENOUS | Status: DC
  Administered 2019-05-13: 3 mL via INTRAVENOUS

## 2019-05-12 MED ORDER — FENTANYL CITRATE (PF) 100 MCG/2ML IJ SOLN
INTRAMUSCULAR | Status: DC | PRN
  Administered 2019-05-12: 25 ug via INTRAVENOUS

## 2019-05-12 MED ORDER — ACETAMINOPHEN 325 MG PO TABS
ORAL_TABLET | ORAL | Status: AC
  Administered 2019-05-12: 650 mg via ORAL
  Filled 2019-05-12: qty 2

## 2019-05-12 MED ORDER — ASPIRIN 81 MG PO CHEW: 81.0000 mg | CHEWABLE_TABLET | Freq: Once | ORAL | Status: DC

## 2019-05-12 MED ORDER — HEPARIN (PORCINE) IN NACL 1000-0.9 UT/500ML-% IV SOLN
INTRAVENOUS | Status: AC
  Filled 2019-05-12: qty 1000

## 2019-05-12 MED ORDER — ACETAMINOPHEN 325 MG PO TABS: 650.0000 mg | ORAL_TABLET | Freq: Once | ORAL | Status: AC

## 2019-05-12 MED ORDER — FENTANYL CITRATE (PF) 100 MCG/2ML IJ SOLN
INTRAMUSCULAR | Status: AC
  Filled 2019-05-12: qty 2

## 2019-05-12 MED ORDER — LIDOCAINE HCL (PF) 1 % IJ SOLN
INTRAMUSCULAR | Status: DC | PRN
  Administered 2019-05-12: 30 mL via INTRADERMAL

## 2019-05-12 MED ORDER — MIDAZOLAM HCL 2 MG/2ML IJ SOLN
INTRAMUSCULAR | Status: DC | PRN
  Administered 2019-05-12: 1 mg via INTRAVENOUS

## 2019-05-12 MED ORDER — LORAZEPAM 2 MG/ML IJ SOLN: 0.5000 mg | Freq: Once | INTRAMUSCULAR | Status: DC

## 2019-05-12 SURGICAL SUPPLY — 8 items
CATH INFINITI 5FR MULTPACK ANG (CATHETERS) ×1 IMPLANT
KIT HEART LEFT (KITS) ×2 IMPLANT
NDL SMART 18GX3.5CM LONG (NEEDLE) IMPLANT
NEEDLE SMART 18GX3.5CM LONG (NEEDLE) ×2 IMPLANT
PACK CARDIAC CATHETERIZATION (CUSTOM PROCEDURE TRAY) ×2 IMPLANT
SHEATH PINNACLE 5F 10CM (SHEATH) ×1 IMPLANT
TRANSDUCER W/STOPCOCK (MISCELLANEOUS) ×2 IMPLANT
WIRE EMERALD 3MM-J .035X150CM (WIRE) ×1 IMPLANT

## 2019-05-12 NOTE — Progress Notes (Addendum)
Ref: Blair Heys, PA-C   Subjective:  C/O right sided headache since 6:45 PM, jaw pain without vision problem, speech problem or weakness in extremities. Last normal 1 hour ago per patient and nurse. Afebrile. O2 sat 100 %. BP 130's /70's. Monitor: Sinus rhythm.  Objective:  Vital Signs in the last 24 hours: Temp:  [97.9 F (36.6 C)-98.2 F (36.8 C)] 97.9 F (36.6 C) (05/07 1735) Pulse Rate:  [0-92] 71 (05/07 1735) Cardiac Rhythm: Normal sinus rhythm (05/07 0856) Resp:  [0-49] 19 (05/07 1735) BP: (118-150)/(69-88) 134/78 (05/07 1735) SpO2:  [0 %-100 %] 100 % (05/07 1735) Weight:  [108.8 kg] 108.8 kg (05/07 0423)  Physical Exam: BP Readings from Last 1 Encounters:  05/12/19 134/78     Wt Readings from Last 1 Encounters:  05/12/19 108.8 kg    Weight change: 0.39 kg Body mass index is 37.57 kg/m. HEENT: [place]/AT, Eyes-Brown, Conjunctiva-Pale, Sclera-Non-icteric Neck: No JVD, No bruit, Trachea midline. Lungs:  Clear, Bilateral. Cardiac:  Regular rhythm, normal S1 and S2, no S3. II/VI systolic murmur. Abdomen:  Soft, non-tender. BS present. Extremities:  No edema present. No cyanosis. No clubbing. CNS: AxOx3, Cranial nerves grossly intact, moves all 4 extremities. Mild weakness of right hand grip. No tongue deviation. Left side facial expression sharper nasolabial fold compared to right side on smiling. Skin: Warm and dry.   Intake/Output from previous day: 05/06 0701 - 05/07 0700 In: 598.7 [P.O.:220; I.V.:378.7] Out: -     Lab Results: BMET    Component Value Date/Time   NA 132 (L) 05/12/2019 0647   NA 132 (L) 05/11/2019 0515   NA 135 05/10/2019 1536   K 5.6 (H) 05/12/2019 0647   K 6.2 (H) 05/11/2019 0515   K 4.7 05/10/2019 1536   CL 91 (L) 05/12/2019 0647   CL 90 (L) 05/11/2019 0515   CL 93 (L) 05/10/2019 1536   CO2 26 05/12/2019 0647   CO2 26 05/11/2019 0515   CO2 26 05/10/2019 1536   GLUCOSE 99 05/12/2019 0647   GLUCOSE 162 (H) 05/11/2019 0515   GLUCOSE 112  (H) 05/10/2019 1536   BUN 53 (H) 05/12/2019 0647   BUN 26 (H) 05/11/2019 0515   BUN 14 05/10/2019 1536   CREATININE 10.40 (H) 05/12/2019 0647   CREATININE 7.19 (H) 05/11/2019 0515   CREATININE 5.34 (H) 05/10/2019 1536   CALCIUM 9.0 05/12/2019 0647   CALCIUM 9.5 05/11/2019 0515   CALCIUM 9.0 05/10/2019 1536   GFRNONAA 4 (L) 05/12/2019 0647   GFRNONAA 6 (L) 05/11/2019 0515   GFRNONAA 9 (L) 05/10/2019 1536   GFRAA 5 (L) 05/12/2019 0647   GFRAA 7 (L) 05/11/2019 0515   GFRAA 10 (L) 05/10/2019 1536   CBC    Component Value Date/Time   WBC 13.3 (H) 05/12/2019 0647   RBC 3.12 (L) 05/12/2019 0647   HGB 8.7 (L) 05/12/2019 0647   HCT 28.0 (L) 05/12/2019 0647   HCT 27.5 (L) 04/05/2018 0943   PLT 274 05/12/2019 0647   MCV 89.7 05/12/2019 0647   MCH 27.9 05/12/2019 0647   MCHC 31.1 05/12/2019 0647   RDW 15.1 05/12/2019 0647   LYMPHSABS 3.0 05/12/2019 0647   MONOABS 0.9 05/12/2019 0647   EOSABS 0.1 05/12/2019 0647   BASOSABS 0.0 05/12/2019 0647   HEPATIC Function Panel Recent Labs    08/23/18 1237 04/27/19 0820 05/10/19 2136  PROT 7.6 6.5 7.5   HEMOGLOBIN A1C No components found for: HGA1C,  MPG CARDIAC ENZYMES Lab Results  Component Value  Date   SIDXFPK 441 (H) 04/28/2015   CKMB 2.5 07/04/2011   TROPONINI <0.03 05/11/2016   TROPONINI <0.03 04/14/2016   TROPONINI <0.03 04/13/2016   BNP No results for input(s): PROBNP in the last 8760 hours. TSH Recent Labs    08/23/18 2115 04/25/19 1222  TSH 0.996 1.702   CHOLESTEROL Recent Labs    05/11/19 0515  CHOL 199    Scheduled Meds: . allopurinol  100 mg Oral QHS  . amLODipine  10 mg Oral Daily  . atorvastatin  40 mg Oral q1800  . calcitRIOL  0.5 mcg Oral Q breakfast  . Chlorhexidine Gluconate Cloth  6 each Topical Daily  . Chlorhexidine Gluconate Cloth  6 each Topical Q0600  . gabapentin  100 mg Oral Daily  . hydrALAZINE  50 mg Oral Q8H  . metoprolol tartrate  50 mg Oral QHS  . multivitamin  1 tablet Oral QHS   . pantoprazole  40 mg Oral Daily  . sodium chloride flush  3 mL Intravenous Q12H   Continuous Infusions: . sodium chloride Stopped (05/11/19 0052)  . sodium chloride     PRN Meds:.sodium chloride, ALPRAZolam, hydrALAZINE, labetalol, oxyCODONE-acetaminophen, promethazine, sodium chloride flush  Assessment/Plan: Acute headache with mild neuro deficit R/O stroke Possible sedation effect ESRD  Neuro-consult-Dr. Malen Gauze notified-7:15 PM. Stat MRI of brain.   LOS: 2 days   Time spent including chart review, lab review, examination, discussion with patient : 30 min   Dixie Dials  MD  05/12/2019, 7:08 PM

## 2019-05-12 NOTE — Interval H&P Note (Signed)
History and Physical Interval Note:  05/12/2019 2:22 PM  Patricia [name]  has presented today for surgery, with the diagnosis of Coronary syndrome.  The various methods of treatment have been discussed with the patient and family. After consideration of risks, benefits and other options for treatment, the patient has consented to  Procedure(s): LEFT HEART CATH AND CORONARY ANGIOGRAPHY (N/A) as a surgical intervention.  The patient's history has been reviewed, patient examined, no change in status, stable for surgery.  I have reviewed the patient's chart and labs.  Questions were answered to the patient's satisfaction.     [name]

## 2019-05-12 NOTE — Plan of Care (Signed)
Called by Dr. Doylene Canard regarding patient post procedure reporting some neurological symptoms. Recommended getting an MRI brain as she is outside the window for IV TPA and has per report no evidence of LVO.  Please call back if imaging is suggestive of any acute neurological process and we will be happy to see her in consultation.  -- Amie Portland, MD [facility]

## 2019-05-12 NOTE — Progress Notes (Signed)
Patient complain of right side head and jaw pain. No c/o SOB or CP. BP 129/73.  MD Doylene Canard notified.

## 2019-05-12 NOTE — Consult Note (Signed)
Renal Service Consult Note [facility]  Patricia [name] 05/12/2019 [place] Requesting Physician:  Dr Doylene Canard  Reason for Consult:  ESRD w/ chest pain HPI: The patient is a 44 y.o. year-old presented to ED Wed pm w/ chest pain after outpatient replacement of her HD port by vasc surgery yesterday. Trop was neg, EKG showed inferior ischemic changes. CXR clear. Seen by cardiology and stress test was + for inferior ischemia. Last echo normal EF. Plan is for heart cath this am. Asked to see for dialysis.   Pt seen in room, she is groggy but responds appropriately. Denies active CP, no SOB, no n/v/d, no abd pain or fevers.   K+ was 6.2 yest and down to 5.6 this am.   ROS  denies CP  no joint pain   no HA  no blurry vision  no rash  no diarrhea  no nausea/ vomiting    Past Medical History  Past Medical History:  Diagnosis Date  . Acid reflux   . Anemia 04/2019   REQUIRING BLOOD TRANSFUSIONS  . Anxiety   . Arthritis   . Dyspnea   . Dysrhythmia    tachycardia  . ESRD (end stage renal disease) ([place])    [place]- [place]  . GI bleeding 12/2017  . Gout   . Headache(784.0)    "related to chemo; sometimes weekly" (09/12/2013)  . High cholesterol   . History of blood transfusion "a couple"   "related to low counts"  . Hypertension   . Iron deficiency anemia    "get epogen shots q month" (02/20/2014)  . 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 Peritoneal dialysis status ([place])   . Stroke [facility]) 08/15/2017   Past Surgical History  Past Surgical History:  Procedure Laterality Date  . ABDOMINAL HYSTERECTOMY  2010   "laparoscopic"  . ANKLE FRACTURE SURGERY Right 1994  . AV FISTULA PLACEMENT Left 04/14/2016   Procedure: ARTERIOVENOUS (AV) FISTULA CREATION;  Surgeon: Angelia Mould, MD;  [facility] OR;  Service: Vascular;  Laterality: Left;  . AV FISTULA PLACEMENT Left 08/09/2017    Procedure: INSERTION OF ARTERIOVENOUS (AV) GORE-TEX GRAFT LEFT ARM;  Surgeon: Elam Dutch, MD;  Location: [place];  Service: Vascular;  Laterality: Left;  . [place] REMOVAL Left 08/11/2017   Procedure: REMOVAL OF ARTERIOVENOUS GORETEX GRAFT ([place]);  Surgeon: Serafina Mitchell, MD;  Location: [facility] OR;  Service: Vascular;  Laterality: Left;  . BIOPSY  09/03/2017   Procedure: BIOPSY;  Surgeon: Otis Brace, MD;  Location: [place];  Service: Gastroenterology;;  . BIOPSY  12/24/2017   Procedure: BIOPSY;  Surgeon: Irving Copas., MD;  Location: [place];  Service: Gastroenterology;;  . CARDIAC CATHETERIZATION  2000's  . COLONOSCOPY WITH PROPOFOL N/A 09/04/2017   Procedure: COLONOSCOPY WITH PROPOFOL;  Surgeon: Ronnette Juniper, MD;  Location: [place];  Service: Gastroenterology;  Laterality: N/A;  . ENTEROSCOPY N/A 12/24/2017   Procedure: ENTEROSCOPY;  Surgeon: Rush Landmark Telford Nab., MD;  Location: [place];  Service: Gastroenterology;  Laterality: N/A;  . ESOPHAGOGASTRODUODENOSCOPY    . ESOPHAGOGASTRODUODENOSCOPY (EGD) WITH PROPOFOL Left 06/04/2017   Procedure: ESOPHAGOGASTRODUODENOSCOPY (EGD) WITH PROPOFOL;  Surgeon: Arta Silence, MD;  Location: [place];  Service: Endoscopy;  Laterality: Left;  . ESOPHAGOGASTRODUODENOSCOPY (EGD) WITH PROPOFOL N/A 09/03/2017   Procedure: ESOPHAGOGASTRODUODENOSCOPY (EGD) WITH PROPOFOL;  Surgeon: Otis Brace, MD;  Location: [place];  Service: Gastroenterology;  Laterality: N/A;  .  FISTULA SUPERFICIALIZATION Left 04/02/2017   Procedure: FISTULA SUPERFICIALIZATION LEFT ARM;  Surgeon: Serafina Mitchell, MD;  Location: [place];  Service: Vascular;  Laterality: Left;  . FISTULOGRAM Left 04/07/2016   Procedure: FISTULOGRAM;  Surgeon: Waynetta Sandy, MD;  Location: [place];  Service: Vascular;  Laterality: Left;  . FRACTURE SURGERY     right ankle  . GIVENS CAPSULE STUDY N/A 10/29/2017   Procedure: GIVENS CAPSULE STUDY;  Surgeon:  Wilford Corner, MD;  Location: [place];  Service: Endoscopy;  Laterality: N/A;  . GIVENS CAPSULE STUDY N/A 12/24/2017   Procedure: GIVENS CAPSULE STUDY;  Surgeon: Irving Copas., MD;  Location: [place];  Service: Gastroenterology;  Laterality: N/A;  . GIVENS CAPSULE STUDY N/A 05/22/2018   Procedure: GIVENS CAPSULE STUDY;  Surgeon: Irving Copas., MD;  Location: [place];  Service: Gastroenterology;  Laterality: N/A;  . INSERTION OF DIALYSIS CATHETER Right 04/07/2016   Procedure: INSERTION OF DIALYSIS CATHETER;  Surgeon: Waynetta Sandy, MD;  Location: [place];  Service: Vascular;  Laterality: Right;  . INSERTION OF DIALYSIS CATHETER Right 04/04/2017   Procedure: INSERTION OF DIALYSIS CATHETER;  Surgeon: Waynetta Sandy, MD;  Location: [place];  Service: Vascular;  Laterality: Right;  . LIGATION OF ARTERIOVENOUS  FISTULA Left 04/14/2016   Procedure: LIGATION OF ARTERIOVENOUS  FISTULA;  Surgeon: Angelia Mould, MD;  Location: [place];  Service: Vascular;  Laterality: Left;  . PATCH ANGIOPLASTY Left 06/19/2016   Procedure: PATCH ANGIOPLASTY LEFT ARTERIOVENOUS FISTULA;  Surgeon: Angelia Mould, MD;  Location: [place];  Service: Vascular;  Laterality: Left;  . POLYPECTOMY  09/04/2017   Procedure: POLYPECTOMY;  Surgeon: Ronnette Juniper, MD;  Location: [facility] ENDOSCOPY;  Service: Gastroenterology;;  . REVISON OF ARTERIOVENOUS FISTULA Left 06/19/2016   Procedure: REVISION SUPERFICIALIZATION OF BRACHIOCEPHALIC ARTERIOVENOUS FISTULA;  Surgeon: Angelia Mould, MD;  Location: [facility] OR;  Service: Vascular;  Laterality: Left;   Family History  Family History  Problem Relation Age of Onset  . Hypertension Mother   . Thyroid disease Mother   . Coronary artery disease Father   . Hypertension Father   . Diabetes Father   . Colon cancer Maternal Aunt   . Esophageal cancer Maternal Uncle   . Inflammatory bowel disease Neg Hx   . Liver disease Neg Hx   .  Pancreatic cancer Neg Hx   . Rectal cancer Neg Hx   . Stomach cancer Neg Hx    Social History  reports that she has never smoked. She has never used smokeless tobacco. She reports that she does not drink alcohol or use drugs. Allergies  Allergies  Allergen Reactions  . Nsaids Other (See Comments)    Cannot take due to Kidney disease/Kidney function   . Tolmetin Other (See Comments)    Cannot take due to Kidney Disease   Home medications Prior to Admission medications   Medication Sig Start Date End Date Taking? Authorizing Provider  allopurinol (ZYLOPRIM) 100 MG tablet Take 100 mg by mouth at bedtime.    Yes [provider]  ALPRAZolam Duanne Moron) 1 MG tablet Take 1 mg by mouth 2 (two) times daily as needed for anxiety.  03/19/17  Yes [provider]  amLODipine (NORVASC) 10 MG tablet Take 1 tablet (10 mg total) by mouth daily. 04/28/19 05/28/19 Yes Elodia Florence., MD  ferric citrate (AURYXIA) 1 GM 210 MG(Fe) tablet Take 3 tablets (630 mg total) by mouth 3 (three) times daily with meals. 04/28/19  Yes  Zigmund Daniel., MD  gabapentin (NEURONTIN) 100 MG capsule Take 100 mg by mouth daily. 04/24/19  Yes [provider]  hydrALAZINE (APRESOLINE) 25 MG tablet Take 2 tablets (50 mg total) by mouth every 8 (eight) hours. 04/28/19 05/28/19 Yes Zigmund Daniel., MD  lisinopril (ZESTRIL) 20 MG tablet Take 1 tablet (20 mg total) by mouth daily. 04/28/19 05/28/19 Yes Zigmund Daniel., MD  metoprolol tartrate (LOPRESSOR) 25 MG tablet Take 2 tablets (50 mg total) by mouth at bedtime. 04/27/19 05/27/19 Yes Zigmund Daniel., MD  multivitamin (RENA-VIT) TABS tablet Take 1 tablet by mouth at bedtime.    Yes [provider]  omeprazole (PRILOSEC) 40 MG capsule Take 40 mg by mouth 2 (two) times daily.   Yes [provider]  potassium chloride SA (KLOR-CON) 20 MEQ tablet Take 20 mEq by mouth daily.   Yes [provider]  pravastatin  (PRAVACHOL) 20 MG tablet Take 20 mg by mouth at bedtime. 06/24/18  Yes [provider]  prochlorperazine (COMPAZINE) 10 MG tablet Take 1 tablet (10 mg total) by mouth every 8 (eight) hours as needed for nausea or vomiting. 02/17/18  Yes Mansouraty, Netty Starring., MD  promethazine (PHENERGAN) 25 MG tablet Take 1 tablet (25 mg total) by mouth every 8 (eight) hours as needed for nausea or vomiting. 04/07/18  Yes Talbert Forest, [place], DO  calcitRIOL (ROCALTROL) 0.5 MCG capsule Take 1 capsule (0.5 mcg total) by mouth daily with breakfast. 06/04/17   Osvaldo Shipper, MD  potassium chloride (K-DUR) 10 MEQ tablet Take 1 tablet (10 mEq total) by mouth daily. Patient not taking: Reported on 05/10/2019 08/26/18   Anne Shutter, MD     Vitals:   05/11/19 2027 05/11/19 2225 05/12/19 0423 05/12/19 0650  BP: 118/75 (!) 150/75 134/88 133/72  Pulse: 83 92 66   Resp: 16  17   Temp: 98.1 F (36.7 C)  98.2 F (36.8 C)   TempSrc: Oral  Oral   SpO2: 97%  100%   Weight:   108.8 kg   Height:       Exam Gen groggy, awakens easily No rash, cyanosis or gangrene Sclera anicteric, throat clear  No jvd or bruits Chest clear bilat to bases no rales, wheezing or bronchial BS RRR no MRG Abd soft ntnd no mass or ascites +bs GU defer MS no joint effusions or deformity Ext no LE or UE edema, no wounds or ulcers Neuro is alert, Ox 3 , nf R TDC    Home meds:  - norvasc 10/ lisinopril 20/ metoprolol 50 hs/ hydralazine 50 tid/ kcl 10 qd  - rocaltrol 0.5 / rena vit/ auryxia 3 ac tid  - xanax prn/ neurontin 100 qd  - PPI/ pravachol / allopurinol  - prn's/ vitamins/ supplements     Outpt HD: [facility]  4h  106kg   R TDC   2/2 bath  Hep 5000    Assessment/ Plan: 1. Chest pain/ ACS - inf ischemia by EKG and stress test. For LHC today 2. ESRD - h/o PD, still has cath in . On HD now MWF, has not missed HD. HD later today or possibly in am tomorrow.  3. HTN/ vol - on mult bp meds, up 2-3kg, UF same w/  HD 4. Gout 5. Anemia ckd - Hb 8- 9's range, get records 6. MBD cdk - cont vdra, binder      Vinson Moselle  MD 05/12/2019, 9:24 AM  Recent Labs  Lab 05/11/19 0515 05/12/19 0647  WBC 10.4 13.3*  HGB 9.1* 8.7*   Recent Labs  Lab 05/10/19 1536 05/10/19 1536 05/11/19 0515 05/12/19 0647  K 4.7   < > 6.2* 5.6*  BUN 14   < > 26* 53*  CREATININE 5.34*   < > 7.19* 10.40*  CALCIUM 9.0  --  9.5 9.0   < > = values in this interval not displayed.

## 2019-05-12 NOTE — Progress Notes (Signed)
[place] for Heparin Indication: chest pain/ACS  Allergies  Allergen Reactions  . Nsaids Other (See Comments)    Cannot take due to Kidney disease/Kidney function   . Tolmetin Other (See Comments)    Cannot take due to Kidney Disease    Patient Measurements: Height: 5\' 7"  (170.2 cm) Weight: 108.8 kg (239 lb 13.8 oz) IBW/kg (Calculated) : 61.6 Heparin Dosing Weight: 86.4 kg  Vital Signs: Temp: 98.2 F (36.8 C) (05/07 0423) Temp Source: Oral (05/07 0423) BP: 124/69 (05/07 0944) Pulse Rate: 66 (05/07 0423)  Labs: Recent Labs    05/10/19 1536 05/10/19 1536 05/10/19 1835 05/11/19 0515 05/11/19 1443 05/12/19 0647  HGB 9.7*   < >  --  9.1*  --  8.7*  HCT 31.5*  --   --  29.0*  --  28.0*  PLT 229  --   --  237  --  274  LABPROT  --   --   --  13.4  --   --   INR  --   --   --  1.1  --   --   HEPARINUNFRC  --   --   --  0.19* 0.37 0.40  CREATININE 5.34*  --   --  7.19*  --  10.40*  TROPONINIHS 7  --  7  --   --   --    < > = values in this interval not displayed.    Estimated Creatinine Clearance: 8.7 mL/min (A) (by C-G formula based on SCr of 10.4 mg/dL (H)).   Medical History: Past Medical History:  Diagnosis Date  . Acid reflux   . Anemia 04/2019   REQUIRING BLOOD TRANSFUSIONS  . Anxiety   . Arthritis   . Dyspnea   . Dysrhythmia    tachycardia  . ESRD (end stage renal disease) ([place])    [place]- [place]  . GI bleeding 12/2017  . Gout   . Headache(784.0)    "related to chemo; sometimes weekly" (09/12/2013)  . High cholesterol   . History of blood transfusion "a couple"   "related to low counts"  . Hypertension   . Iron deficiency anemia    "get epogen shots q month" (02/20/2014)  . 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 Peritoneal dialysis status ([place])   . Stroke [facility]) 08/15/2017   Assessment: 44 year old female with ESRD on HD-MWF. Presented chest  pain after HD catheter placement. Pharmacy consulted to dose heparin. No AC PTA.   Heparin level this morning came back therapeutic at 0.40, on 1250 units/hr. Hgb 8.7, plt 274. No s/sx of bleeding. D-dimer 1.78.   Goal of Therapy:  Heparin level 0.3-0.7 units/ml Monitor platelets by anticoagulation protocol: Yes   Plan:  Continue heparin infusion at 1250 units/hr Monitor daily HL, CBC, and for s/sx of bleeding  F/u after cath  Antonietta Jewel, PharmD, [place] Pharmacist  Phone: [phone] 05/12/2019 10:52 AM  Please check AMION for all [place] phone numbers After 10:00 PM, call [place] [phone]

## 2019-05-12 NOTE — Progress Notes (Addendum)
Patient refused MRI of brain due to Claustrophobia.  Will get CT head WO contrast to r/o Bleed.  Will get MRI brain when patient is ready   Dixie Dials, MD  12 May 2019 8: 06 PM

## 2019-05-12 NOTE — Progress Notes (Signed)
Pt brought down for MRI. Pt squeezed alarm ball immediately upon [place] exiting room.  Nurse called to get meds. Pt then said she wanted to try later and wanted to go back to room, though asked multiple times if willing to try again. Nurse informed me that doctor approved meds but due to pt request to try later, we are unable to continue.

## 2019-05-12 NOTE — Progress Notes (Signed)
MD Doylene Canard came to bedside. MRI ordered.

## 2019-05-12 NOTE — Progress Notes (Signed)
Site area: right groin  Site Prior to Removal:  Level 0  Pressure Applied For 20 MINUTES    Minutes Beginning at 1500  Manual:   Yes.    Patient Status During Pull:  Stable   Post Pull Groin Site:  Level 0  Post Pull Instructions Given:  Yes.    Post Pull Pulses Present:  Yes.    Dressing Applied:  Yes.    Comments:  Bed rest started at 1520 X 4 hr.

## 2019-05-13 LAB — CBC WITH DIFFERENTIAL/PLATELET
Abs Immature Granulocytes: 0.06 10*3/uL (ref 0.00–0.07)
Basophils Absolute: 0 10*3/uL (ref 0.0–0.1)
Basophils Relative: 0 %
Eosinophils Absolute: 0.1 10*3/uL (ref 0.0–0.5)
Eosinophils Relative: 1 %
HCT: 27.9 % — ABNORMAL LOW (ref 36.0–46.0)
Hemoglobin: 8.7 g/dL — ABNORMAL LOW (ref 12.0–15.0)
Immature Granulocytes: 1 %
Lymphocytes Relative: 25 %
Lymphs Abs: 2 10*3/uL (ref 0.7–4.0)
MCH: 28.3 pg (ref 26.0–34.0)
MCHC: 31.2 g/dL (ref 30.0–36.0)
MCV: 90.9 fL (ref 80.0–100.0)
Monocytes Absolute: 0.8 10*3/uL (ref 0.1–1.0)
Monocytes Relative: 10 %
Neutro Abs: 5 10*3/uL (ref 1.7–7.7)
Neutrophils Relative %: 63 %
Platelets: 260 10*3/uL (ref 150–400)
RBC: 3.07 MIL/uL — ABNORMAL LOW (ref 3.87–5.11)
RDW: 15.5 % (ref 11.5–15.5)
WBC: 8 10*3/uL (ref 4.0–10.5)
nRBC: 0.2 % (ref 0.0–0.2)

## 2019-05-13 LAB — BASIC METABOLIC PANEL
Anion gap: 14 (ref 5–15)
BUN: 28 mg/dL — ABNORMAL HIGH (ref 6–20)
CO2: 24 mmol/L (ref 22–32)
Calcium: 9 mg/dL (ref 8.9–10.3)
Chloride: 98 mmol/L (ref 98–111)
Creatinine, Ser: 6.58 mg/dL — ABNORMAL HIGH (ref 0.44–1.00)
GFR calc Af Amer: 8 mL/min — ABNORMAL LOW (ref >60)
GFR calc non Af Amer: 7 mL/min — ABNORMAL LOW (ref >60)
Glucose, Bld: 96 mg/dL (ref 70–99)
Potassium: 5.3 mmol/L — ABNORMAL HIGH (ref 3.5–5.1)
Sodium: 136 mmol/L (ref 135–145)

## 2019-05-13 MED ORDER — SODIUM ZIRCONIUM CYCLOSILICATE 10 G PO PACK
10.0000 g | PACK | Freq: Once | ORAL | Status: AC
  Administered 2019-05-13: 10 g via ORAL
  Filled 2019-05-13: qty 1

## 2019-05-13 MED ORDER — ATORVASTATIN CALCIUM 40 MG PO TABS: 40.0000 mg | ORAL_TABLET | Freq: Every day | ORAL | 3 refills | Status: DC

## 2019-05-13 NOTE — Discharge Summary (Signed)
Physician Discharge Summary  Patient ID: Patricia [name] MRN: [id number] DOB/AGE: August 21, 1975 44 y.o.  Admit date: 05/10/2019 Discharge date: 05/13/2019  Admission Diagnoses: Acute coronary syndrome HTN Hyperlipidemia Obesity S/P stroke Gout  Discharge Diagnoses:  Principle problem: Non-cardiac chest pain Active Problems: ESRD Transient ischemic attack Hyperlipidemia Hyperkalemia, improving Adverse effect of sedative medication Obesity Gout S/P stroke  Discharged Condition: fair  Hospital Course: 44 years old black female presented with chest pain aggravated by HD catheter placement. Her nuclear stress test was abnormal. She underwent cardiac catheterization with near normal coronaries. Her LV EF was normal on echocardiogram. She had episode of right sided facial and lower extremities weakness. She refused MRI of brain due to anxiety. She had normal CT scan of head. Her right sided weakness resolved in few hours.  Her elevated potassium level decreased with 10 gm of Lokelma use and dialysis session. Her oxygen saturations remained normal. She was discharged home in stable condition with f/u by primary care in 1 week and by me in 2 weeks.  Consults: cardiology and nephrology  Significant Diagnostic Studies: labs: Near normal CBC except Hgb of 9.7 to 8.7 gm. Potassium 6.2, down to 5.3 in 48 hours. Creatinine down to 6.56 mg. On day of discharge. LDL 135 mg. and HDL of 37 mg. Troponin I levels are normal.  EKG: NSR.  Echocardiogram: Normal LV systolic function on 07/02/29  Cardiac cath: Normal coronaries.  Treatments: cardiac meds: lisinopril (generic), metoprolol, amlodipine and hydralazine and dialysis: Hemodialysis.  Discharge Exam: Blood pressure 125/80, pulse 76, temperature 98.6 F (37 C), temperature source Oral, resp. rate 16, height $RemoveBe'5\' 7"'DjqioidCI$  (1.702 m), weight 107.8 kg, SpO2 97 %. General appearance: alert, cooperative and appears stated age. Head:  Normocephalic, atraumatic. Eyes: Brown eyes, pale conjunctiva, corneas clear. PERRL, EOM's intact.  Neck: No adenopathy, no carotid bruit, no JVD, supple, symmetrical, trachea midline and thyroid not enlarged. Resp: Clear to auscultation bilaterally. Cardio: Regular rate and rhythm, S1, S2 normal, II/VI systolic murmur, no click, rub or gallop. GI: Soft, non-tender; bowel sounds normal; no organomegaly. Extremities: No edema, cyanosis or clubbing. Right IJ TDC. Skin: Warm and dry.  Neurologic: Alert and oriented X 3, normal strength and tone. Normal coordination and gait.  Disposition: Discharge disposition: 01-Home or Self Care        Allergies as of 05/13/2019      Reactions   Nsaids Other (See Comments)   Cannot take due to Kidney disease/Kidney function   Tolmetin Other (See Comments)   Cannot take due to Kidney Disease      Medication List    STOP taking these medications   potassium chloride 10 MEQ tablet Commonly known as: KLOR-CON   potassium chloride SA 20 MEQ tablet Commonly known as: KLOR-CON   pravastatin 20 MG tablet Commonly known as: PRAVACHOL     TAKE these medications   allopurinol 100 MG tablet Commonly known as: ZYLOPRIM Take 100 mg by mouth at bedtime.   ALPRAZolam 1 MG tablet Commonly known as: XANAX Take 1 mg by mouth 2 (two) times daily as needed for anxiety.   amLODipine 10 MG tablet Commonly known as: NORVASC Take 1 tablet (10 mg total) by mouth daily.   atorvastatin 40 MG tablet Commonly known as: LIPITOR Take 1 tablet (40 mg total) by mouth daily at 6 PM.   calcitRIOL 0.5 MCG capsule Commonly known as: ROCALTROL Take 1 capsule (0.5 mcg total) by mouth daily with breakfast.   ferric citrate 1 GM 210  MG(Fe) tablet Commonly known as: AURYXIA Take 3 tablets (630 mg total) by mouth 3 (three) times daily with meals.   gabapentin 100 MG capsule Commonly known as: NEURONTIN Take 100 mg by mouth daily.   hydrALAZINE 25 MG  tablet Commonly known as: APRESOLINE Take 2 tablets (50 mg total) by mouth every 8 (eight) hours.   lisinopril 20 MG tablet Commonly known as: ZESTRIL Take 1 tablet (20 mg total) by mouth daily.   metoprolol tartrate 25 MG tablet Commonly known as: LOPRESSOR Take 2 tablets (50 mg total) by mouth at bedtime.   multivitamin Tabs tablet Take 1 tablet by mouth at bedtime.   omeprazole 40 MG capsule Commonly known as: PRILOSEC Take 40 mg by mouth 2 (two) times daily.   prochlorperazine 10 MG tablet Commonly known as: COMPAZINE Take 1 tablet (10 mg total) by mouth every 8 (eight) hours as needed for nausea or vomiting.   promethazine 25 MG tablet Commonly known as: PHENERGAN Take 1 tablet (25 mg total) by mouth every 8 (eight) hours as needed for nausea or vomiting.      Follow-up Information    Long, Caryl Pina, [place]. Schedule an appointment as soon as possible for a visit in 1 week(s).   Specialty: Physician Assistant Contact information: [address] [place] [address] [phone]        Dixie Dials, MD. Schedule an appointment as soon as possible for a visit in 2 week(s).   Specialty: Cardiology Contact information: [place] [address] [phone]           Time spent: Review of old chart, current chart, lab, x-ray, cardiac tests and discussion with patient over 60 minutes.  Signed: [name] 05/13/2019, 11:51 AM

## 2019-05-13 NOTE — Progress Notes (Signed)
[name] NEPHROLOGY PROGRESS NOTE  Assessment/ Plan: Pt is a 44 y.o. yo female with ESRD on HD admitted with chest pain with EKG changes and stress test positive for inferior ischemia.  Outpt HD: [facility]  4h  106kg   R TDC   2/2 bath  Hep 5000   #Chest pain with EKG changes and positive stress test: Is status post left heart cath on 5/7 with normal coronaries arteries and required no intervention.  Cardiology is following.  # ESRD: She was on PD which is currently on hold because of peritonitis.  Receiving dialysis via right IJ TDC.  Status post HD yesterday with 3 L UF, tolerated well.  Plan for next HD on 5/10.  Ultimately the plan is to go back to PD as outpatient.  #Hyperkalemia: Improved after HD.  K5.3 today.  Order a dose of Lokelma.  # Anemia of CKD: Hemoglobin 8.7.  She received Mircera 100 mcg on 5/5.  # Secondary hyperparathyroidism: Calcium acceptable.  Check phosphorus level.  # HTN/volume: Blood pressure and volume status acceptable.  Continue amlodipine.  Subjective: Seen and examined at bedside.  No new event.  Denies nausea vomiting.  Denies chest pain or shortness of breath.  Had dialysis yesterday with 3000 cc UF.  Tolerated well.  Objective Vital signs in last 24 hours: Vitals:   05/13/19 0154 05/13/19 0420 05/13/19 0641 05/13/19 0957  BP: 130/71 121/66 131/78 125/80  Pulse: 79 76    Resp: 20 16    Temp: 98 F (36.7 C) 98.6 F (37 C)    TempSrc: Oral Oral    SpO2: 99% 97%    Weight:      Height:       Weight change: 2 kg  Intake/Output Summary (Last 24 hours) at 05/13/2019 1034 Last data filed at 05/13/2019 0120 Gross per 24 hour  Intake --  Output 3000 ml  Net -3000 ml       Labs: Basic Metabolic Panel: Recent Labs  Lab 05/11/19 0515 05/12/19 0647 05/13/19 0537  NA 132* 132* 136  K 6.2* 5.6* 5.3*  CL 90* 91* 98  CO2 $Re'26 26 24  'cMf$ GLUCOSE 162* 99 96  BUN 26* 53* 28*  CREATININE 7.19* 10.40* 6.58*  CALCIUM 9.5 9.0 9.0    Liver Function Tests: Recent Labs  Lab 05/10/19 2136  AST 26  ALT 16  ALKPHOS 40  BILITOT 0.7  PROT 7.5  ALBUMIN 3.7   Recent Labs  Lab 05/11/19 0515  LIPASE 34   No results for input(s): AMMONIA in the last 168 hours. CBC: Recent Labs  Lab 05/10/19 1536 05/10/19 1536 05/11/19 0515 05/12/19 0647 05/13/19 0537  WBC 10.0   < > 10.4 13.3* 8.0  NEUTROABS  --   --  8.4* 9.1* 5.0  HGB 9.7*   < > 9.1* 8.7* 8.7*  HCT 31.5*   < > 29.0* 28.0* 27.9*  MCV 92.9  --  90.3 89.7 90.9  PLT 229   < > 237 274 260   < > = values in this interval not displayed.   Cardiac Enzymes: No results for input(s): CKTOTAL, CKMB, CKMBINDEX, TROPONINI in the last 168 hours. CBG: No results for input(s): GLUCAP in the last 168 hours.  Iron Studies: No results for input(s): IRON, TIBC, TRANSFERRIN, FERRITIN in the last 72 hours. Studies/Results: CT HEAD WO CONTRAST  Result Date: 05/12/2019 CLINICAL DATA:  Headache EXAM: CT HEAD WITHOUT CONTRAST TECHNIQUE: Contiguous axial images were obtained from the  base of the skull through the vertex without intravenous contrast. COMPARISON:  09/01/2016 FINDINGS: Brain: No acute infarct or hemorrhage. Lateral ventricles and midline structures are unremarkable. No acute extra-axial fluid collections. No mass effect. Vascular: No hyperdense vessel or unexpected calcification. Skull: Normal. Negative for fracture or focal lesion. Sinuses/Orbits: No acute finding. Other: None. IMPRESSION: 1. Stable head CT, no acute process. Electronically Signed   By: Randa Ngo M.D.   On: 05/12/2019 20:26   CARDIAC CATHETERIZATION  Result Date: 05/12/2019 Non-cardiac chest pain. Medical therapy with life-style modification.  NM Myocar Multi W/Spect W/Wall Motion / EF  Result Date: 05/11/2019 CLINICAL DATA:  Chest pain. EXAM: MYOCARDIAL IMAGING WITH SPECT (REST AND PHARMACOLOGIC-STRESS) GATED LEFT VENTRICULAR WALL MOTION STUDY LEFT VENTRICULAR EJECTION FRACTION TECHNIQUE:  Standard myocardial SPECT imaging was performed after resting intravenous injection of 10.8 mCi Tc-60m tetrofosmin. Subsequently, intravenous infusion of Lexiscan was performed under the supervision of the Cardiology staff. At peak effect of the drug, 29.6 mCi Tc-80m tetrofosmin was injected intravenously and standard myocardial SPECT imaging was performed. Quantitative gated imaging was also performed to evaluate left ventricular wall motion, and estimate left ventricular ejection fraction. COMPARISON:  06/18/2015 FINDINGS: Perfusion: Mild scarring in the lateral wall. Moderate scarring in the muscular septum near its basilar margin. Mild inducible ischemia along the inferior wall at the cardiac base. Wall Motion: Mild septal hypokinesis. Left Ventricular Ejection Fraction: 62 % End diastolic volume 323 ml End systolic volume 46 ml IMPRESSION: 1. Small amount of inducible ischemia at the inferior base. Moderate scarring in the basilar margin of the muscular septum, and mild scarring along the lateral wall. 2. Mild septal hypokinesis. 3. Left ventricular ejection fraction 62% 4. Non invasive risk stratification*: Low *2012 Appropriate Use Criteria for Coronary Revascularization Focused Update: J Am Coll Cardiol. 5573;22(0):254-270. [email or web address] Electronically Signed   By: Van Clines M.D.   On: 05/11/2019 13:05    Medications: Infusions: . sodium chloride Stopped (05/11/19 0052)  . sodium chloride      Scheduled Medications: . allopurinol  100 mg Oral QHS  . amLODipine  10 mg Oral Daily  . atorvastatin  40 mg Oral q1800  . calcitRIOL  0.5 mcg Oral Q breakfast  . Chlorhexidine Gluconate Cloth  6 each Topical Daily  . Chlorhexidine Gluconate Cloth  6 each Topical Q0600  . gabapentin  100 mg Oral Daily  . heparin sodium (porcine)      . hydrALAZINE  50 mg Oral Q8H  . LORazepam  0.5 mg Intravenous Once  . metoprolol tartrate  50 mg Oral QHS  .  multivitamin  1 tablet Oral QHS  . pantoprazole  40 mg Oral Daily  . sodium chloride flush  3 mL Intravenous Q12H    have reviewed scheduled and prn medications.  Physical Exam: General:NAD, comfortable Heart:RRR, s1s2 nl Lungs:clear b/l, no crackle Abdomen:soft, Non-tender, non-distended Extremities:No edema Dialysis Access: Right IJ TDC.  Patricia [name] 05/13/2019,10:34 AM  LOS: 3 days  Pager: [phone]

## 2019-05-14 ENCOUNTER — Emergency Department (HOSPITAL_COMMUNITY)
Admission: EM | Admit: 2019-05-14 | Discharge: 2019-05-15 | Disposition: A | Discharge status: Home / Self Care | Payer: Medicare Other | Attending: Emergency Medicine | Admitting: Emergency Medicine

## 2019-05-14 ENCOUNTER — Other Ambulatory Visit: Payer: Self-pay

## 2019-05-14 ENCOUNTER — Encounter (HOSPITAL_COMMUNITY): Payer: Self-pay | Admitting: *Deleted

## 2019-05-14 DIAGNOSIS — I120 Hypertensive chronic kidney disease with stage 5 chronic kidney disease or end stage renal disease: Secondary | ICD-10-CM | POA: Diagnosis not present

## 2019-05-14 DIAGNOSIS — Z79899 Other long term (current) drug therapy: Secondary | ICD-10-CM | POA: Insufficient documentation

## 2019-05-14 DIAGNOSIS — Z992 Dependence on renal dialysis: Secondary | ICD-10-CM | POA: Insufficient documentation

## 2019-05-14 DIAGNOSIS — R079 Chest pain, unspecified: Secondary | ICD-10-CM | POA: Diagnosis present

## 2019-05-14 DIAGNOSIS — D631 Anemia in chronic kidney disease: Secondary | ICD-10-CM | POA: Insufficient documentation

## 2019-05-14 DIAGNOSIS — I471 Supraventricular tachycardia: Secondary | ICD-10-CM | POA: Diagnosis not present

## 2019-05-14 DIAGNOSIS — N186 End stage renal disease: Secondary | ICD-10-CM | POA: Diagnosis not present

## 2019-05-14 DIAGNOSIS — D638 Anemia in other chronic diseases classified elsewhere: Secondary | ICD-10-CM

## 2019-05-14 LAB — BASIC METABOLIC PANEL
Anion gap: 12 (ref 5–15)
BUN: 58 mg/dL — ABNORMAL HIGH (ref 6–20)
CO2: 22 mmol/L (ref 22–32)
Calcium: 9.1 mg/dL (ref 8.9–10.3)
Chloride: 104 mmol/L (ref 98–111)
Creatinine, Ser: 11.49 mg/dL — ABNORMAL HIGH (ref 0.44–1.00)
GFR calc Af Amer: 4 mL/min — ABNORMAL LOW (ref >60)
GFR calc non Af Amer: 4 mL/min — ABNORMAL LOW (ref >60)
Glucose, Bld: 116 mg/dL — ABNORMAL HIGH (ref 70–99)
Potassium: 4.6 mmol/L (ref 3.5–5.1)
Sodium: 138 mmol/L (ref 135–145)

## 2019-05-14 LAB — CBC
HCT: 25.1 % — ABNORMAL LOW (ref 36.0–46.0)
Hemoglobin: 7.6 g/dL — ABNORMAL LOW (ref 12.0–15.0)
MCH: 27.8 pg (ref 26.0–34.0)
MCHC: 30.3 g/dL (ref 30.0–36.0)
MCV: 91.9 fL (ref 80.0–100.0)
Platelets: 248 10*3/uL (ref 150–400)
RBC: 2.73 MIL/uL — ABNORMAL LOW (ref 3.87–5.11)
RDW: 15.8 % — ABNORMAL HIGH (ref 11.5–15.5)
WBC: 10.9 10*3/uL — ABNORMAL HIGH (ref 4.0–10.5)
nRBC: 0 % (ref 0.0–0.2)

## 2019-05-14 LAB — MAGNESIUM: Magnesium: 1.8 mg/dL (ref 1.7–2.4)

## 2019-05-14 NOTE — ED Provider Notes (Signed)
[facility] EMERGENCY DEPARTMENT Provider Note   CSN: [id number] Arrival date & time: 05/14/19  2157     History Chief Complaint  Patient presents with  . Chest Pain  . tachycardia    Patricia [name] is a 44 y.o. female.  Patient with history of SVT, hypertension, hyperlipidemia, anemia of chronic disease requiring blood transfusion, end-stage renal disease now on hemodialysis, discharged yesterday from hospital after having left heart cath with normal coronary arteries --presents to the emergency department with acute onset of tachycardia and palpitations around 9 PM while she was getting ready for bed.  Patient with associated lightheadedness and shortness of breath.  Symptoms were similar to previous episodes of SVT.  EMS was called.  They gave 6 mg of adenosine with conversion to normal sinus rhythm.  Symptoms have resolved.  Patient denies any recent fevers, nausea, vomiting, diarrhea.  She dialyzes through a hemodialysis catheter in right chest wall.  She was previously on peritoneal dialysis prior to developing peritonitis.  Patient recently had metoprolol increased from 25 mg daily to 50 mg daily.  She took this medication tonight around 8 PM.        Past Medical History:  Diagnosis Date  . Acid reflux   . Anemia 04/2019   REQUIRING BLOOD TRANSFUSIONS  . Anxiety   . Arthritis   . Dyspnea   . Dysrhythmia    tachycardia  . ESRD (end stage renal disease) ([place])    [facility]- [place]  . GI bleeding 12/2017  . Gout   . Headache(784.0)    "related to chemo; sometimes weekly" (09/12/2013)  . High cholesterol   . History of blood transfusion "a couple"   "related to low counts"  . Hypertension   . Iron deficiency anemia    "get epogen shots q month" (02/20/2014)  . 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 Peritoneal dialysis status ([place])   . Stroke [facility]) 08/15/2017    Patient Active  Problem List   Diagnosis Date Noted  . Chest pain 05/10/2019  . Anemia 04/25/2019  . SVT (supraventricular tachycardia) ([place]) 04/25/2019  . Elevated troponin 04/25/2019  . ESRD on hemodialysis ([place]) 04/25/2019  . Acute on chronic blood loss anemia 05/21/2018  . GI bleed 05/20/2018  . ESRD on peritoneal dialysis ([place]) 04/01/2016  . Anemia due to chronic kidney disease 04/01/2016  . GERD (gastroesophageal reflux disease) 04/01/2016  . Nephrotic syndrome 10/02/2013  . Dyslipidemia 10/02/2013  . PAT (paroxysmal atrial tachycardia) ([place]) 09/29/2013  . Normocytic anemia 06/29/2013  . Glomerulonephritis 01/28/2013  . Morbid obesity ([place]) 07/04/2011  . HTN (hypertension) 07/04/2011  . Hypercholesterolemia 07/04/2011    Past Surgical History:  Procedure Laterality Date  . ABDOMINAL HYSTERECTOMY  2010   "laparoscopic"  . ANKLE FRACTURE SURGERY Right 1994  . AV FISTULA PLACEMENT Left 04/14/2016   Procedure: ARTERIOVENOUS (AV) FISTULA CREATION;  Surgeon: Angelia Mould, MD;  Location: [facility] OR;  Service: Vascular;  Laterality: Left;  . AV FISTULA PLACEMENT Left 08/09/2017   Procedure: INSERTION OF ARTERIOVENOUS (AV) GORE-TEX GRAFT LEFT ARM;  Surgeon: Elam Dutch, MD;  Location: [place];  Service: Vascular;  Laterality: Left;  . [place] REMOVAL Left 08/11/2017   Procedure: REMOVAL OF ARTERIOVENOUS GORETEX GRAFT ([place]);  Surgeon: Serafina Mitchell, MD;  Location: [facility] OR;  Service: Vascular;  Laterality: Left;  . BIOPSY  09/03/2017   Procedure: BIOPSY;  Surgeon: Otis Brace,  MD;  Location: [place];  Service: Gastroenterology;;  . BIOPSY  12/24/2017   Procedure: BIOPSY;  Surgeon: Irving Copas., MD;  Location: [place];  Service: Gastroenterology;;  . CARDIAC CATHETERIZATION  2000's  . COLONOSCOPY WITH PROPOFOL N/A 09/04/2017   Procedure: COLONOSCOPY WITH PROPOFOL;  Surgeon: Ronnette Juniper, MD;  Location: [place];  Service: Gastroenterology;  Laterality: N/A;  .  ENTEROSCOPY N/A 12/24/2017   Procedure: ENTEROSCOPY;  Surgeon: Rush Landmark Telford Nab., MD;  Location: [place];  Service: Gastroenterology;  Laterality: N/A;  . ESOPHAGOGASTRODUODENOSCOPY    . ESOPHAGOGASTRODUODENOSCOPY (EGD) WITH PROPOFOL Left 06/04/2017   Procedure: ESOPHAGOGASTRODUODENOSCOPY (EGD) WITH PROPOFOL;  Surgeon: Arta Silence, MD;  Location: [place];  Service: Endoscopy;  Laterality: Left;  . ESOPHAGOGASTRODUODENOSCOPY (EGD) WITH PROPOFOL N/A 09/03/2017   Procedure: ESOPHAGOGASTRODUODENOSCOPY (EGD) WITH PROPOFOL;  Surgeon: Otis Brace, MD;  Location: [place];  Service: Gastroenterology;  Laterality: N/A;  . FISTULA SUPERFICIALIZATION Left 04/02/2017   Procedure: FISTULA SUPERFICIALIZATION LEFT ARM;  Surgeon: Serafina Mitchell, MD;  Location: [place];  Service: Vascular;  Laterality: Left;  . FISTULOGRAM Left 04/07/2016   Procedure: FISTULOGRAM;  Surgeon: Waynetta Sandy, MD;  Location: [place];  Service: Vascular;  Laterality: Left;  . FRACTURE SURGERY     right ankle  . GIVENS CAPSULE STUDY N/A 10/29/2017   Procedure: GIVENS CAPSULE STUDY;  Surgeon: Wilford Corner, MD;  Location: [place];  Service: Endoscopy;  Laterality: N/A;  . GIVENS CAPSULE STUDY N/A 12/24/2017   Procedure: GIVENS CAPSULE STUDY;  Surgeon: Irving Copas., MD;  Location: [place];  Service: Gastroenterology;  Laterality: N/A;  . GIVENS CAPSULE STUDY N/A 05/22/2018   Procedure: GIVENS CAPSULE STUDY;  Surgeon: Irving Copas., MD;  Location: [place];  Service: Gastroenterology;  Laterality: N/A;  . INSERTION OF DIALYSIS CATHETER Right 04/07/2016   Procedure: INSERTION OF DIALYSIS CATHETER;  Surgeon: Waynetta Sandy, MD;  Location: [place];  Service: Vascular;  Laterality: Right;  . INSERTION OF DIALYSIS CATHETER Right 04/04/2017   Procedure: INSERTION OF DIALYSIS CATHETER;  Surgeon: Waynetta Sandy, MD;  Location: [place];  Service: Vascular;   Laterality: Right;  . LIGATION OF ARTERIOVENOUS  FISTULA Left 04/14/2016   Procedure: LIGATION OF ARTERIOVENOUS  FISTULA;  Surgeon: Angelia Mould, MD;  Location: [place];  Service: Vascular;  Laterality: Left;  . PATCH ANGIOPLASTY Left 06/19/2016   Procedure: PATCH ANGIOPLASTY LEFT ARTERIOVENOUS FISTULA;  Surgeon: Angelia Mould, MD;  Location: [place];  Service: Vascular;  Laterality: Left;  . POLYPECTOMY  09/04/2017   Procedure: POLYPECTOMY;  Surgeon: Ronnette Juniper, MD;  Location: [facility] ENDOSCOPY;  Service: Gastroenterology;;  . REVISON OF ARTERIOVENOUS FISTULA Left 06/19/2016   Procedure: REVISION SUPERFICIALIZATION OF BRACHIOCEPHALIC ARTERIOVENOUS FISTULA;  Surgeon: Angelia Mould, MD;  Location: [facility] OR;  Service: Vascular;  Laterality: Left;     OB History    Gravida  3   Para      Term      Preterm      AB      Living        SAB      TAB      Ectopic      Multiple  3   Live Births              Family History  Problem Relation Age of Onset  . Hypertension Mother   . Thyroid disease Mother   . Coronary artery disease Father   . Hypertension Father   .  Diabetes Father   . Colon cancer Maternal Aunt   . Esophageal cancer Maternal Uncle   . Inflammatory bowel disease Neg Hx   . Liver disease Neg Hx   . Pancreatic cancer Neg Hx   . Rectal cancer Neg Hx   . Stomach cancer Neg Hx     Social History   Tobacco Use  . Smoking status: Never Smoker  . Smokeless tobacco: Never Used  Substance Use Topics  . Alcohol use: No  . Drug use: No    Home Medications Prior to Admission medications   Medication Sig Start Date End Date Taking? Authorizing Provider  allopurinol (ZYLOPRIM) 100 MG tablet Take 100 mg by mouth at bedtime.     [provider]  ALPRAZolam Duanne Moron) 1 MG tablet Take 1 mg by mouth 2 (two) times daily as needed for anxiety.  03/19/17   [provider]  amLODipine (NORVASC) 10 MG tablet Take 1 tablet (10 mg total) by  mouth daily. 04/28/19 05/28/19  Elodia Florence., MD  atorvastatin (LIPITOR) 40 MG tablet Take 1 tablet (40 mg total) by mouth daily at 6 PM. 05/13/19   Dixie Dials, MD  calcitRIOL (ROCALTROL) 0.5 MCG capsule Take 1 capsule (0.5 mcg total) by mouth daily with breakfast. 06/04/17   Bonnielee Haff, MD  ferric citrate (AURYXIA) 1 GM 210 MG(Fe) tablet Take 3 tablets (630 mg total) by mouth 3 (three) times daily with meals. 04/28/19   Elodia Florence., MD  gabapentin (NEURONTIN) 100 MG capsule Take 100 mg by mouth daily. 04/24/19   [provider]  hydrALAZINE (APRESOLINE) 25 MG tablet Take 2 tablets (50 mg total) by mouth every 8 (eight) hours. 04/28/19 05/28/19  Elodia Florence., MD  lisinopril (ZESTRIL) 20 MG tablet Take 1 tablet (20 mg total) by mouth daily. 04/28/19 05/28/19  Elodia Florence., MD  metoprolol tartrate (LOPRESSOR) 25 MG tablet Take 2 tablets (50 mg total) by mouth at bedtime. 04/27/19 05/27/19  Elodia Florence., MD  multivitamin (RENA-VIT) TABS tablet Take 1 tablet by mouth at bedtime.     [provider]  omeprazole (PRILOSEC) 40 MG capsule Take 40 mg by mouth 2 (two) times daily.    [provider]  prochlorperazine (COMPAZINE) 10 MG tablet Take 1 tablet (10 mg total) by mouth every 8 (eight) hours as needed for nausea or vomiting. 02/17/18   Mansouraty, Telford Nab., MD  promethazine (PHENERGAN) 25 MG tablet Take 1 tablet (25 mg total) by mouth every 8 (eight) hours as needed for nausea or vomiting. 04/07/18   Shirley, [place], DO    Allergies    Nsaids and [place]  Review of Systems   Review of Systems  Constitutional: Negative for fever.  HENT: Negative for rhinorrhea and sore throat.   Eyes: Negative for redness.  Respiratory: Positive for shortness of breath. Negative for cough.   Cardiovascular: Positive for palpitations. Negative for chest pain.  Gastrointestinal: Negative for abdominal pain, diarrhea, nausea and vomiting.   Musculoskeletal: Negative for myalgias.  Skin: Negative for rash.  Neurological: Positive for light-headedness. Negative for headaches.    Physical Exam Updated Vital Signs Pulse 97   Temp 98 F (36.7 C)   Resp 18   Ht $R'5\' 7"'vC$  (1.702 m)   Wt 107.5 kg   SpO2 100%   BMI 37.12 kg/m   Physical Exam Vitals and nursing note reviewed.  Constitutional:      Appearance: She is well-developed.  HENT:  Head: Normocephalic and atraumatic.  Eyes:     General:        Right eye: No discharge.        Left eye: No discharge.     Conjunctiva/sclera: Conjunctivae normal.  Cardiovascular:     Rate and Rhythm: Normal rate and regular rhythm.     Heart sounds: Normal heart sounds.  Pulmonary:     Effort: Pulmonary effort is normal.     Breath sounds: Normal breath sounds.  Chest:     Comments: Dialysis catheter noted in right chest wall. Abdominal:     Palpations: Abdomen is soft.     Tenderness: There is no abdominal tenderness.  Musculoskeletal:     Cervical back: Normal range of motion and neck supple.  Skin:    General: Skin is warm and dry.  Neurological:     Mental Status: She is alert.     ED Results / Procedures / Treatments   Labs (all labs ordered are listed, but only abnormal results are displayed) Labs Reviewed  CBC - Abnormal; Notable for the following components:      Result Value   WBC 10.9 (*)    RBC 2.73 (*)    Hemoglobin 7.6 (*)    HCT 25.1 (*)    RDW 15.8 (*)    All other components within normal limits  BASIC METABOLIC PANEL  MAGNESIUM    EKG EKG Interpretation  Date/Time:  Sunday May 14 2019 22:33:45 EDT Ventricular Rate:  93 PR Interval:  154 QRS Duration: 84 QT Interval:  356 QTC Calculation: 442 R Axis:   40 Text Interpretation: Normal sinus rhythm Normal ECG Confirmed by Blanchie Dessert [phone]) on 05/14/2019 10:48:05 PM   Radiology No results found.  Procedures Procedures (including critical care time)  Medications Ordered in ED  Medications - No data to display  ED Course  I have reviewed the triage vital signs and the nursing notes.  Pertinent labs & imaging results that were available during my care of the patient were reviewed by me and considered in my medical decision making (see chart for details).  Patient seen and examined.  EMS rhythm strips at bedside.  EKG reviewed from ED here -- NSR. They demonstrate supraventricular tachycardia with conversion to normal sinus rhythm after administration of adenosine.  She is in normal sinus rhythm here.  She is frustrated being back in the hospital but otherwise looks well.  Vital signs reviewed and are as follows: Pulse 97   Temp 98 F (36.7 C)   Resp 18   Ht $R'5\' 7"'ab$  (1.702 m)   Wt 107.5 kg   SpO2 100%   BMI 37.12 kg/m   Discussed patient with Dr. Maryan Rued.  Given hemodialysis status, h/o anemia -- will check labs and monitor. If work-up reassuring and patient is stable, plan for d/c to home.      Upstill PA-C aware of patient. Pt updated on results and agrees with plan.     MDM Rules/Calculators/A&P                      Patient on HD, recent normal cath, with recurrent SVT. Anemia trending worse but no indication for transfusion tonight. Awaiting electrolytes.     Final Clinical Impression(s) / ED Diagnoses Final diagnoses:  Supraventricular tachycardia ([place])  Anemia of chronic disease    Rx / DC Orders ED Discharge Orders    None       Carlisle Cater, PA-C 05/14/19  [facility], MD 05/15/19 [phone]

## 2019-05-14 NOTE — Discharge Instructions (Signed)
Please read and follow all provided instructions.  Your diagnoses today include:  1. Supraventricular tachycardia ([place])   2. Anemia of chronic disease     Tests performed today include:  An EKG of your heart - normal rhythm in the Emergency Department  Blood counts and electrolytes - your red blood cell count is getting lower again, please let your dialysis team know about this  Vital signs. See below for your results today.   Medications prescribed:   None  Take any prescribed medications only as directed.  Follow-up instructions: Please call Dr. Merrilee Jansky office tomorrow and let them know you had another episode of supraventricular tachycardia.   Return instructions:  SEEK IMMEDIATE MEDICAL ATTENTION IF:  You have severe chest pain, especially if the pain is crushing or pressure-like and spreads to the arms, back, neck, or jaw, or if you have sweating, nausea (feeling sick to your stomach), or shortness of breath. THIS IS AN EMERGENCY. Don't wait to see if the pain will go away. Get medical help at once. Call 911 or 0 (operator). DO NOT drive yourself to the hospital.   You wake from sleep with chest pain or shortness of breath.  You feel dizzy or faint.  You have chest pain not typical of your usual pain for which you originally saw your caregiver.   You have any other emergent concerns regarding your health.  Your vital signs today were: BP 105/66   Pulse 88   Temp 98 F (36.7 C)   Resp 18   Ht $R'5\' 7"'ad$  (1.702 m)   Wt 107.5 kg   SpO2 100%   BMI 37.12 kg/m  If your blood pressure (BP) was elevated above 135/85 this visit, please have this repeated by your doctor within one month. --------------

## 2019-05-14 NOTE — ED Triage Notes (Signed)
The pt arrived by gems from home where her heart starte beating fast  With chest pain and sob    She was in svt  Iv per ems and she was given $RemoveB'6mg'AYrBbfoc$  of adeocine iv  Converted to a nsr  hsitory of the same   She was just discharged from this hospital yesterday after a cardiac  Cath  And o x 4   Dialysis pt  Catheter in her rt upper chest  Due for dialysis tomorrow

## 2019-05-23 ENCOUNTER — Other Ambulatory Visit: Payer: Self-pay | Admitting: *Deleted

## 2019-05-23 DIAGNOSIS — Z992 Dependence on renal dialysis: Secondary | ICD-10-CM

## 2019-05-30 ENCOUNTER — Other Ambulatory Visit (HOSPITAL_COMMUNITY): Payer: Medicare Other

## 2019-05-30 ENCOUNTER — Ambulatory Visit: Payer: Medicare Other | Admitting: Vascular Surgery

## 2019-05-30 ENCOUNTER — Inpatient Hospital Stay (HOSPITAL_COMMUNITY): Admission: RE | Admit: 2019-05-30 | Payer: Medicare Other | Source: Ambulatory Visit

## 2019-06-06 ENCOUNTER — Inpatient Hospital Stay: Payer: Medicare Other | Admitting: Family Medicine

## 2019-07-04 ENCOUNTER — Ambulatory Visit (INDEPENDENT_AMBULATORY_CARE_PROVIDER_SITE_OTHER)
Admission: RE | Admit: 2019-07-04 | Discharge: 2019-07-04 | Disposition: A | Discharge status: Home / Self Care | Payer: Medicare Other | Source: Ambulatory Visit | Attending: Vascular Surgery | Admitting: Vascular Surgery

## 2019-07-04 ENCOUNTER — Ambulatory Visit (INDEPENDENT_AMBULATORY_CARE_PROVIDER_SITE_OTHER): Payer: Medicare Other | Admitting: Vascular Surgery

## 2019-07-04 ENCOUNTER — Other Ambulatory Visit: Payer: Self-pay

## 2019-07-04 ENCOUNTER — Ambulatory Visit (HOSPITAL_COMMUNITY)
Admission: RE | Admit: 2019-07-04 | Discharge: 2019-07-04 | Disposition: A | Discharge status: Home / Self Care | Payer: Medicare Other | Source: Ambulatory Visit | Attending: Vascular Surgery | Admitting: Vascular Surgery

## 2019-07-04 ENCOUNTER — Encounter: Payer: Self-pay | Admitting: Vascular Surgery

## 2019-07-04 VITALS — BP 155/92 | HR 62 | Temp 97.3°F | Resp 16 | Ht 67.0 in | Wt 237.0 lb

## 2019-07-04 DIAGNOSIS — N186 End stage renal disease: Secondary | ICD-10-CM | POA: Insufficient documentation

## 2019-07-04 DIAGNOSIS — Z992 Dependence on renal dialysis: Secondary | ICD-10-CM

## 2019-07-04 NOTE — Progress Notes (Signed)
[name] MRN: [id number] DOB: Dec 11, 1975 Sex: female  REASON FOR VISIT: Evaluate for permanent dialysis access  HPI: Patricia [name] is a 44 y.o. female with history of end-stage renal disease on dialysis Monday Wednesday Friday that presents for permanent hemodialysis access evaluation.  Patient is currently dialyzing via right IJ tunneled catheter that she states was placed in April of this year.  Prior to this she was on peritoneal dialysis for the last year to year and a half but states catheter got infected and had to be removed in April of this year.  She is well-known to the vascular surgery service and is previously had a left radiocephalic that had to be ligated and then a left brachiocephalic in 2018 and ultimately conversion to a left upper arm AV graft in 2019.  Unfortunately she got steal and the graft had to be removed.  She last saw Dr. Darrick Penna in 2019 who recommended vein mapping in the right arm and returning to reevaluate for access.  She states she never returned since she ended up doing peritoneal dialysis.  She's had no access in the right arm.  No numbness or tingling in the right hand at this time.  No other chest wall implants aside from right IJ tunneled catheter.  States she is going to [facility] to be evaluated for kidney transplant.  Past Medical History:  Diagnosis Date  . Acid reflux   . Anemia 04/2019   REQUIRING BLOOD TRANSFUSIONS  . Anxiety   . Arthritis   . Dyspnea   . Dysrhythmia    tachycardia  . ESRD (end stage renal disease) (HCC)    [facility]- [place]  . GI bleeding 12/2017  . Gout   . Headache(784.0)    "related to chemo; sometimes weekly" (09/12/2013)  . High cholesterol   . History of blood transfusion "a couple"   "related to low counts"  . Hypertension   . Iron deficiency anemia    "get epogen shots q month" (02/20/2014)  . MCGN (minimal change glomerulonephritis)    "using chemo to tx;  finished my last tx in  12/2013"  . Peritoneal dialysis status (HCC)   . Stroke [facility]) 08/15/2017    Past Surgical History:  Procedure Laterality Date  . ABDOMINAL HYSTERECTOMY  2010   "laparoscopic"  . ANKLE FRACTURE SURGERY Right 1994  . AV FISTULA PLACEMENT Left 04/14/2016   Procedure: ARTERIOVENOUS (AV) FISTULA CREATION;  Surgeon: Chuck Hint, MD;  Location: [facility] OR;  Service: Vascular;  Laterality: Left;  . AV FISTULA PLACEMENT Left 08/09/2017   Procedure: INSERTION OF ARTERIOVENOUS (AV) GORE-TEX GRAFT LEFT ARM;  Surgeon: Sherren Kerns, MD;  Location: [facility] OR;  Service: Vascular;  Laterality: Left;  . AVGG REMOVAL Left 08/11/2017   Procedure: REMOVAL OF ARTERIOVENOUS GORETEX GRAFT (AVGG);  Surgeon: Nada Libman, MD;  Location: [facility] OR;  Service: Vascular;  Laterality: Left;  . BIOPSY  09/03/2017   Procedure: BIOPSY;  Surgeon: Kathi Der, MD;  Location: [facility] ENDOSCOPY;  Service: Gastroenterology;;  . BIOPSY  12/24/2017   Procedure: BIOPSY;  Surgeon: Lemar Lofty., MD;  Location: [facility] ENDOSCOPY;  Service: Gastroenterology;;  . CARDIAC CATHETERIZATION  2000's  . COLONOSCOPY WITH PROPOFOL N/A 09/04/2017   Procedure: COLONOSCOPY WITH PROPOFOL;  Surgeon: Kerin Salen, MD;  Location: [facility] ENDOSCOPY;  Service: Gastroenterology;  Laterality: N/A;  . ENTEROSCOPY N/A 12/24/2017   Procedure: ENTEROSCOPY;  Surgeon: Meridee Score Netty Starring., MD;  Location: [facility] ENDOSCOPY;  Service: Gastroenterology;  Laterality: N/A;  . ESOPHAGOGASTRODUODENOSCOPY    . ESOPHAGOGASTRODUODENOSCOPY (EGD) WITH PROPOFOL Left 06/04/2017   Procedure: ESOPHAGOGASTRODUODENOSCOPY (EGD) WITH PROPOFOL;  Surgeon: Arta Silence, MD;  Location: [place];  Service: Endoscopy;  Laterality: Left;  . ESOPHAGOGASTRODUODENOSCOPY (EGD) WITH PROPOFOL N/A 09/03/2017   Procedure: ESOPHAGOGASTRODUODENOSCOPY (EGD) WITH PROPOFOL;  Surgeon: Otis Brace, MD;  Location: [place];  Service: Gastroenterology;  Laterality: N/A;  . FISTULA  SUPERFICIALIZATION Left 04/02/2017   Procedure: FISTULA SUPERFICIALIZATION LEFT ARM;  Surgeon: Serafina Mitchell, MD;  Location: [place];  Service: Vascular;  Laterality: Left;  . FISTULOGRAM Left 04/07/2016   Procedure: FISTULOGRAM;  Surgeon: Waynetta Sandy, MD;  Location: [place];  Service: Vascular;  Laterality: Left;  . FRACTURE SURGERY     right ankle  . GIVENS CAPSULE STUDY N/A 10/29/2017   Procedure: GIVENS CAPSULE STUDY;  Surgeon: Wilford Corner, MD;  Location: [place];  Service: Endoscopy;  Laterality: N/A;  . GIVENS CAPSULE STUDY N/A 12/24/2017   Procedure: GIVENS CAPSULE STUDY;  Surgeon: Irving Copas., MD;  Location: [place];  Service: Gastroenterology;  Laterality: N/A;  . GIVENS CAPSULE STUDY N/A 05/22/2018   Procedure: GIVENS CAPSULE STUDY;  Surgeon: Irving Copas., MD;  Location: [place];  Service: Gastroenterology;  Laterality: N/A;  . INSERTION OF DIALYSIS CATHETER Right 04/07/2016   Procedure: INSERTION OF DIALYSIS CATHETER;  Surgeon: Waynetta Sandy, MD;  Location: [place];  Service: Vascular;  Laterality: Right;  . INSERTION OF DIALYSIS CATHETER Right 04/04/2017   Procedure: INSERTION OF DIALYSIS CATHETER;  Surgeon: Waynetta Sandy, MD;  Location: [place];  Service: Vascular;  Laterality: Right;  . LEFT HEART CATH AND CORONARY ANGIOGRAPHY N/A 05/12/2019   Procedure: LEFT HEART CATH AND CORONARY ANGIOGRAPHY;  Surgeon: Dixie Dials, MD;  Location: [place] [facility];  Service: Cardiovascular;  Laterality: N/A;  . LIGATION OF ARTERIOVENOUS  FISTULA Left 04/14/2016   Procedure: LIGATION OF ARTERIOVENOUS  FISTULA;  Surgeon: Angelia Mould, MD;  Location: [place];  Service: Vascular;  Laterality: Left;  . PATCH ANGIOPLASTY Left 06/19/2016   Procedure: PATCH ANGIOPLASTY LEFT ARTERIOVENOUS FISTULA;  Surgeon: Angelia Mould, MD;  Location: [place];  Service: Vascular;  Laterality: Left;  . POLYPECTOMY  09/04/2017   Procedure:  POLYPECTOMY;  Surgeon: Ronnette Juniper, MD;  Location: [facility] ENDOSCOPY;  Service: Gastroenterology;;  . REVISON OF ARTERIOVENOUS FISTULA Left 06/19/2016   Procedure: REVISION SUPERFICIALIZATION OF BRACHIOCEPHALIC ARTERIOVENOUS FISTULA;  Surgeon: Angelia Mould, MD;  Location: [facility] OR;  Service: Vascular;  Laterality: Left;    Family History  Problem Relation Age of Onset  . Hypertension Mother   . Thyroid disease Mother   . Coronary artery disease Father   . Hypertension Father   . Diabetes Father   . Colon cancer Maternal Aunt   . Esophageal cancer Maternal Uncle   . Inflammatory bowel disease Neg Hx   . Liver disease Neg Hx   . Pancreatic cancer Neg Hx   . Rectal cancer Neg Hx   . Stomach cancer Neg Hx     SOCIAL HISTORY: Social History   Tobacco Use  . Smoking status: Never Smoker  . Smokeless tobacco: Never Used  Substance Use Topics  . Alcohol use: No    Allergies  Allergen Reactions  . Nsaids Other (See Comments)    Cannot take due to Kidney disease/Kidney function   . Tolmetin Other (See Comments)    Cannot take due to Kidney Disease    Current Outpatient Medications  Medication Sig Dispense Refill  . allopurinol (ZYLOPRIM) 100 MG tablet Take 100 mg by mouth at bedtime.     . ALPRAZolam (XANAX) 1 MG tablet Take 1 mg by mouth 2 (two) times daily as needed for anxiety.     Marland Kitchen atorvastatin (LIPITOR) 40 MG tablet Take 1 tablet (40 mg total) by mouth daily at 6 PM. 30 tablet 3  . calcitRIOL (ROCALTROL) 0.5 MCG capsule Take 1 capsule (0.5 mcg total) by mouth daily with breakfast. 30 capsule 0  . ferric citrate (AURYXIA) 1 GM 210 MG(Fe) tablet Take 3 tablets (630 mg total) by mouth 3 (three) times daily with meals. 270 tablet   . gabapentin (NEURONTIN) 100 MG capsule Take 100 mg by mouth daily.    . multivitamin (RENA-VIT) TABS tablet Take 1 tablet by mouth at bedtime.     Marland Kitchen omeprazole (PRILOSEC) 40 MG capsule Take 40 mg by mouth 2 (two) times daily.    .  prochlorperazine (COMPAZINE) 10 MG tablet Take 1 tablet (10 mg total) by mouth every 8 (eight) hours as needed for nausea or vomiting. 50 tablet 2  . promethazine (PHENERGAN) 25 MG tablet Take 1 tablet (25 mg total) by mouth every 8 (eight) hours as needed for nausea or vomiting. 10 tablet 0  . amLODipine (NORVASC) 10 MG tablet Take 1 tablet (10 mg total) by mouth daily. 30 tablet 0  . hydrALAZINE (APRESOLINE) 25 MG tablet Take 2 tablets (50 mg total) by mouth every 8 (eight) hours. 180 tablet 0  . lisinopril (ZESTRIL) 20 MG tablet Take 1 tablet (20 mg total) by mouth daily. 30 tablet 0  . metoprolol tartrate (LOPRESSOR) 25 MG tablet Take 2 tablets (50 mg total) by mouth at bedtime. 60 tablet 0   No current facility-administered medications for this visit.    REVIEW OF SYSTEMS:  $RemoveB'[X]'[id number]$  denotes positive finding, $RemoveBeforeDEI'[ ]'[id number]$  denotes negative finding Cardiac  Comments:  Chest pain or chest pressure:    Shortness of breath upon exertion:    Short of breath when lying flat:    Irregular heart rhythm:        Vascular    Pain in calf, thigh, or hip brought on by ambulation:    Pain in feet at night that wakes you up from your sleep:     Blood clot in your veins:    Leg swelling:         Pulmonary    Oxygen at home:    Productive cough:     Wheezing:         Neurologic    Sudden weakness in arms or legs:     Sudden numbness in arms or legs:     Sudden onset of difficulty speaking or slurred speech:    Temporary loss of vision in one eye:     Problems with dizziness:         Gastrointestinal    Blood in stool:     Vomited blood:         Genitourinary    Burning when urinating:     Blood in urine:        Psychiatric    Major depression:         Hematologic    Bleeding problems:    Problems with blood clotting too easily:        Skin    Rashes or ulcers:        Constitutional    Fever or chills:  PHYSICAL EXAM: Vitals:   07/04/19 1138  BP: (!) 155/92  Pulse: 62  Resp: 16    Temp: (!) 97.3 F (36.3 C)  TempSrc: Temporal  SpO2: 100%  Weight: 237 lb (107.5 kg)  Height: $Remove'5\' 7"'FjpjNjl$  (1.702 m)    GENERAL: The patient is a well-nourished female, in no acute distress. The vital signs are documented above. CARDIAC: There is a regular rate and rhythm.  VASCULAR:  Palpable radial pulses in bilateral upper extremities Right brachial pulse palpable Right IJ tunneled catheter Previous incisions in left arm from multiple fistula with no appreciable thrill PULMONARY: There is good air exchange bilaterally without wheezing or rales. ABDOMEN: Soft and non-tender with normal pitched bowel sounds.  MUSCULOSKELETAL: There are no major deformities or cyanosis. NEUROLOGIC: No focal weakness or paresthesias are detected. SKIN: There are no ulcers or rashes noted.   DATA:   I reviewed upper extremity vein mapping and she has a marginal cephalic and basilic vein in the right arm that may be usable although each has one segment that appears on the smaller side  Assessment/Plan:  44 year old female with end-stage renal disease on dialysis Monday Wednesday and Friday that presents for evaluation of permanent dialysis access.  Discussed given that she has had failed radiocephalic, brachiocephalic and subsequent upper arm AV graft in the left arm that had to be removedfor steal would recommend moving to the right arm.  She does have a marginal cephalic and basilic vein as noted above that I would evaluate with ultrasound in the OR and use whichever looks the best  and discussed if this is not an option would use upper arm graft.  She is very nervous about proceeding with dialysis access given multiple failed attempts in the left arm which is understandable.  Certainly discussed that there would be risk for steal and/or access failure in the right arm but certainly would be the best option to get her catheter removed.  She wants to think about this and I gave her our office contact information  and she will call back to schedule when she is ready to proceed.   Marty Heck, MD [facility] Office: [phone]

## 2019-07-05 ENCOUNTER — Other Ambulatory Visit (HOSPITAL_COMMUNITY): Payer: Self-pay

## 2019-07-06 ENCOUNTER — Ambulatory Visit (HOSPITAL_COMMUNITY)
Admission: RE | Admit: 2019-07-06 | Discharge: 2019-07-06 | Disposition: A | Discharge status: Home / Self Care | Payer: Medicare Other | Source: Ambulatory Visit | Attending: Nephrology | Admitting: Nephrology

## 2019-07-06 ENCOUNTER — Other Ambulatory Visit: Payer: Self-pay

## 2019-07-06 DIAGNOSIS — N186 End stage renal disease: Secondary | ICD-10-CM | POA: Insufficient documentation

## 2019-07-06 LAB — PREPARE RBC (CROSSMATCH)

## 2019-07-06 MED ORDER — SODIUM CHLORIDE 0.9% IV SOLUTION: Freq: Once | INTRAVENOUS | Status: DC

## 2019-07-07 LAB — BPAM RBC
Blood Product Expiration Date: 202107082359
ISSUE DATE / TIME: 202107010946
Unit Type and Rh: 9500

## 2019-07-07 LAB — TYPE AND SCREEN
ABO/RH(D): O NEG
Antibody Screen: NEGATIVE
Unit division: 0

## 2019-10-03 ENCOUNTER — Other Ambulatory Visit: Payer: Self-pay

## 2019-10-03 ENCOUNTER — Emergency Department (HOSPITAL_COMMUNITY)
Admission: EM | Admit: 2019-10-03 | Discharge: 2019-10-04 | Disposition: A | Discharge status: Home / Self Care | Payer: Medicare Other | Attending: Emergency Medicine | Admitting: Emergency Medicine

## 2019-10-03 ENCOUNTER — Encounter (HOSPITAL_COMMUNITY): Payer: Self-pay | Admitting: Emergency Medicine

## 2019-10-03 ENCOUNTER — Emergency Department (HOSPITAL_COMMUNITY): Payer: Medicare Other

## 2019-10-03 DIAGNOSIS — R0602 Shortness of breath: Secondary | ICD-10-CM | POA: Diagnosis not present

## 2019-10-03 DIAGNOSIS — Z992 Dependence on renal dialysis: Secondary | ICD-10-CM | POA: Diagnosis not present

## 2019-10-03 DIAGNOSIS — R079 Chest pain, unspecified: Secondary | ICD-10-CM | POA: Insufficient documentation

## 2019-10-03 DIAGNOSIS — N186 End stage renal disease: Secondary | ICD-10-CM | POA: Insufficient documentation

## 2019-10-03 DIAGNOSIS — R42 Dizziness and giddiness: Secondary | ICD-10-CM | POA: Diagnosis not present

## 2019-10-03 DIAGNOSIS — D649 Anemia, unspecified: Secondary | ICD-10-CM | POA: Diagnosis not present

## 2019-10-03 DIAGNOSIS — R195 Other fecal abnormalities: Secondary | ICD-10-CM | POA: Diagnosis not present

## 2019-10-03 NOTE — ED Triage Notes (Signed)
Patient reports central chest pain with SOB onset yesterday , denies emesis or diaphoresis , patient added dark/black stools today .

## 2019-10-03 NOTE — ED Notes (Signed)
Not able to get lab work in triage.

## 2019-10-04 ENCOUNTER — Other Ambulatory Visit: Payer: Self-pay

## 2019-10-04 ENCOUNTER — Ambulatory Visit (INDEPENDENT_AMBULATORY_CARE_PROVIDER_SITE_OTHER)
Admission: EM | Admit: 2019-10-04 | Discharge: 2019-10-04 | Disposition: A | Discharge status: Home / Self Care | Payer: Medicare Other | Source: Home / Self Care

## 2019-10-04 ENCOUNTER — Encounter (HOSPITAL_COMMUNITY): Payer: Self-pay | Admitting: Emergency Medicine

## 2019-10-04 DIAGNOSIS — R0602 Shortness of breath: Secondary | ICD-10-CM

## 2019-10-04 DIAGNOSIS — R42 Dizziness and giddiness: Secondary | ICD-10-CM | POA: Insufficient documentation

## 2019-10-04 DIAGNOSIS — Z992 Dependence on renal dialysis: Secondary | ICD-10-CM | POA: Insufficient documentation

## 2019-10-04 DIAGNOSIS — N186 End stage renal disease: Secondary | ICD-10-CM | POA: Insufficient documentation

## 2019-10-04 DIAGNOSIS — D649 Anemia, unspecified: Secondary | ICD-10-CM | POA: Insufficient documentation

## 2019-10-04 LAB — CBC
HCT: 27.4 % — ABNORMAL LOW (ref 36.0–46.0)
Hemoglobin: 8.2 g/dL — ABNORMAL LOW (ref 12.0–15.0)
MCH: 24.3 pg — ABNORMAL LOW (ref 26.0–34.0)
MCHC: 29.9 g/dL — ABNORMAL LOW (ref 30.0–36.0)
MCV: 81.3 fL (ref 80.0–100.0)
Platelets: 316 10*3/uL (ref 150–400)
RBC: 3.37 MIL/uL — ABNORMAL LOW (ref 3.87–5.11)
RDW: 21.2 % — ABNORMAL HIGH (ref 11.5–15.5)
WBC: 6.9 10*3/uL (ref 4.0–10.5)
nRBC: 0.7 % — ABNORMAL HIGH (ref 0.0–0.2)

## 2019-10-04 NOTE — Discharge Instructions (Signed)
You have severely decreased blood cell counts of 8.2 hemoglobin and 27.4% hematocrit. I am concerned that you will continue to decline without emergent intervention including consideration for a blood transfusion. Please report to the emergency room now for a higher level of care than we can provide in the urgent care setting.

## 2019-10-04 NOTE — ED Notes (Signed)
PT stated that she is going to be leaving and make a doctors appointment in AM

## 2019-10-04 NOTE — ED Triage Notes (Signed)
Pt c/o shortness of breath and dizziness onset Saturday. She is a dialysis patient. She states this happens when her "blood is low". Pt states last dialysis labs about 2 weeks ago.

## 2019-10-04 NOTE — ED Notes (Signed)
Patient is being discharged from the Urgent Care and sent to the Emergency Department via personal vehicle . Per Jaynee Eagles, patient is in need of higher level of care due to critical labs. Patient is aware and verbalizes understanding of plan of care.   Vitals:   10/04/19 1745 10/04/19 2002  BP: (!) 193/87 (!) 174/104  Pulse: 68 66  Temp: (!) 97.5 F (36.4 C) (!) 97.5 F (36.4 C)  SpO2: 99% 99%

## 2019-10-04 NOTE — ED Provider Notes (Signed)
[place]   MRN: [id number] DOB: 14-Jul-1975  Subjective:   Patricia [name] is a 44 y.o. female presenting for 4-day history of persistent shortness of breath, dizziness.  Patient does peritoneal dialysis at home.  Last dialysis was last night.  She was in the emergency room as well.  Has a history of GI bleed, chronic blood loss anemia.  Has had to have blood transfusions in the past, last episode was in early July.  In the emergency room they were able to get 2 consecutive EKGs and was in sinus rhythm without acute findings.  They were unable to obtain blood work including troponins which have been elevated in the past.  Currently she reports mild chest pain.  No current facility-administered medications for this encounter.  Current Outpatient Medications:  .  allopurinol (ZYLOPRIM) 100 MG tablet, Take 100 mg by mouth at bedtime. , Disp: , Rfl:  .  ALPRAZolam (XANAX) 1 MG tablet, Take 1 mg by mouth 2 (two) times daily as needed for anxiety. , Disp: , Rfl:  .  amLODipine (NORVASC) 10 MG tablet, Take 1 tablet (10 mg total) by mouth daily., Disp: 30 tablet, Rfl: 0 .  atorvastatin (LIPITOR) 40 MG tablet, Take 1 tablet (40 mg total) by mouth daily at 6 PM., Disp: 30 tablet, Rfl: 3 .  calcitRIOL (ROCALTROL) 0.5 MCG capsule, Take 1 capsule (0.5 mcg total) by mouth daily with breakfast., Disp: 30 capsule, Rfl: 0 .  ferric citrate (AURYXIA) 1 GM 210 MG(Fe) tablet, Take 3 tablets (630 mg total) by mouth 3 (three) times daily with meals., Disp: 270 tablet, Rfl:  .  gabapentin (NEURONTIN) 100 MG capsule, Take 100 mg by mouth daily., Disp: , Rfl:  .  hydrALAZINE (APRESOLINE) 25 MG tablet, Take 2 tablets (50 mg total) by mouth every 8 (eight) hours., Disp: 180 tablet, Rfl: 0 .  lisinopril (ZESTRIL) 20 MG tablet, Take 1 tablet (20 mg total) by mouth daily., Disp: 30 tablet, Rfl: 0 .  metoprolol tartrate (LOPRESSOR) 25 MG tablet, Take 2 tablets (50 mg total) by mouth at  bedtime., Disp: 60 tablet, Rfl: 0 .  multivitamin (RENA-VIT) TABS tablet, Take 1 tablet by mouth at bedtime. , Disp: , Rfl:  .  omeprazole (PRILOSEC) 40 MG capsule, Take 40 mg by mouth 2 (two) times daily., Disp: , Rfl:  .  prochlorperazine (COMPAZINE) 10 MG tablet, Take 1 tablet (10 mg total) by mouth every 8 (eight) hours as needed for nausea or vomiting., Disp: 50 tablet, Rfl: 2 .  promethazine (PHENERGAN) 25 MG tablet, Take 1 tablet (25 mg total) by mouth every 8 (eight) hours as needed for nausea or vomiting., Disp: 10 tablet, Rfl: 0   Allergies  Allergen Reactions  . Nsaids Other (See Comments)    Cannot take due to Kidney disease/Kidney function   . Tolmetin Other (See Comments)    Cannot take due to Kidney Disease    Past Medical History:  Diagnosis Date  . Acid reflux   . Anemia 04/2019   REQUIRING BLOOD TRANSFUSIONS  . Anxiety   . Arthritis   . Dyspnea   . Dysrhythmia    tachycardia  . ESRD (end stage renal disease) ([place])    [place]THSAT- [place]  . GI bleeding 12/2017  . Gout   . Headache(784.0)    "related to chemo; sometimes weekly" (09/12/2013)  . High cholesterol   . History of blood transfusion "a couple"   "related to low counts"  .  Hypertension   . Iron deficiency anemia    "get epogen shots q month" (02/20/2014)  . 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 Peritoneal dialysis status ([place])   . Stroke [facility]) 08/15/2017     Past Surgical History:  Procedure Laterality Date  . ABDOMINAL HYSTERECTOMY  2010   "laparoscopic"  . ANKLE FRACTURE SURGERY Right 1994  . AV FISTULA PLACEMENT Left 04/14/2016   Procedure: ARTERIOVENOUS (AV) FISTULA CREATION;  Surgeon: Angelia Mould, MD;  Location[facility];  Service: Vascular;  Laterality: Left;  . AV FISTULA PLACEMENT Left 08/09/2017   Procedure: INSERTION OF ARTERIOVENOUS (AV) GORE-TEX GRAFT LEFT ARM;  Surgeon: Elam Dutch, MD;  Location: [place];  Service:  Vascular;  Laterality: Left;  . [place] REMOVAL Left 08/11/2017   Procedure: REMOVAL OF ARTERIOVENOUS GORETEX GRAFT ([place]);  Surgeon: Serafina Mitchell, MD;  Location: [facility] OR;  Service: Vascular;  Laterality: Left;  . BIOPSY  09/03/2017   Procedure: BIOPSY;  Surgeon: Otis Brace, MD;  Location: [place];  Service: Gastroenterology;;  . BIOPSY  12/24/2017   Procedure: BIOPSY;  Surgeon: Irving Copas., MD;  Location: [place];  Service: Gastroenterology;;  . CARDIAC CATHETERIZATION  2000's  . COLONOSCOPY WITH PROPOFOL N/A 09/04/2017   Procedure: COLONOSCOPY WITH PROPOFOL;  Surgeon: Ronnette Juniper, MD;  Location: [place];  Service: Gastroenterology;  Laterality: N/A;  . ENTEROSCOPY N/A 12/24/2017   Procedure: ENTEROSCOPY;  Surgeon: Rush Landmark Telford Nab., MD;  Location: [place];  Service: Gastroenterology;  Laterality: N/A;  . ESOPHAGOGASTRODUODENOSCOPY    . ESOPHAGOGASTRODUODENOSCOPY (EGD) WITH PROPOFOL Left 06/04/2017   Procedure: ESOPHAGOGASTRODUODENOSCOPY (EGD) WITH PROPOFOL;  Surgeon: Arta Silence, MD;  Location: [place];  Service: Endoscopy;  Laterality: Left;  . ESOPHAGOGASTRODUODENOSCOPY (EGD) WITH PROPOFOL N/A 09/03/2017   Procedure: ESOPHAGOGASTRODUODENOSCOPY (EGD) WITH PROPOFOL;  Surgeon: Otis Brace, MD;  Location: [place];  Service: Gastroenterology;  Laterality: N/A;  . FISTULA SUPERFICIALIZATION Left 04/02/2017   Procedure: FISTULA SUPERFICIALIZATION LEFT ARM;  Surgeon: Serafina Mitchell, MD;  Location: [place];  Service: Vascular;  Laterality: Left;  . FISTULOGRAM Left 04/07/2016   Procedure: FISTULOGRAM;  Surgeon: Waynetta Sandy, MD;  Location: [place];  Service: Vascular;  Laterality: Left;  . FRACTURE SURGERY     right ankle  . GIVENS CAPSULE STUDY N/A 10/29/2017   Procedure: GIVENS CAPSULE STUDY;  Surgeon: Wilford Corner, MD;  Location: [place];  Service: Endoscopy;  Laterality: N/A;  . GIVENS CAPSULE STUDY N/A 12/24/2017    Procedure: GIVENS CAPSULE STUDY;  Surgeon: Irving Copas., MD;  Location: [place];  Service: Gastroenterology;  Laterality: N/A;  . GIVENS CAPSULE STUDY N/A 05/22/2018   Procedure: GIVENS CAPSULE STUDY;  Surgeon: Irving Copas., MD;  Location: [place];  Service: Gastroenterology;  Laterality: N/A;  . INSERTION OF DIALYSIS CATHETER Right 04/07/2016   Procedure: INSERTION OF DIALYSIS CATHETER;  Surgeon: Waynetta Sandy, MD;  Location: [place];  Service: Vascular;  Laterality: Right;  . INSERTION OF DIALYSIS CATHETER Right 04/04/2017   Procedure: INSERTION OF DIALYSIS CATHETER;  Surgeon: Waynetta Sandy, MD;  Location: [place];  Service: Vascular;  Laterality: Right;  . LEFT HEART CATH AND CORONARY ANGIOGRAPHY N/A 05/12/2019   Procedure: LEFT HEART CATH AND CORONARY ANGIOGRAPHY;  Surgeon: Dixie Dials, MD;  Location: [place] CV LAB;  Service: Cardiovascular;  Laterality: N/A;  . LIGATION OF ARTERIOVENOUS  FISTULA Left 04/14/2016   Procedure: LIGATION  OF ARTERIOVENOUS  FISTULA;  Surgeon: Angelia Mould, MD;  Location: [place];  Service: Vascular;  Laterality: Left;  . PATCH ANGIOPLASTY Left 06/19/2016   Procedure: PATCH ANGIOPLASTY LEFT ARTERIOVENOUS FISTULA;  Surgeon: Angelia Mould, MD;  Location: [place];  Service: Vascular;  Laterality: Left;  . POLYPECTOMY  09/04/2017   Procedure: POLYPECTOMY;  Surgeon: Ronnette Juniper, MD;  [facility] END[facility]COPY;  Service: Gastroenterology;;  . REVISON OF ARTERIOVENOUS FISTULA Left 06/19/2016   Procedure: REVISION SUPERFICIALIZATION OF BRACHIOCEPHALIC ARTERIOVENOUS FISTULA;  Surgeon: Angelia Mould, MD;  Location: [facility] OR;  Service: Vascular;  Laterality: Left;    Family History  Problem Relation Age of Onset  . Hypertension Mother   . Thyroid disease Mother   . Coronary artery disease Father   . Hypertension Father   . Diabetes Father   . Colon cancer Maternal Aunt   . Esophageal cancer Maternal Uncle    . Inflammatory bowel disease Neg Hx   . Liver disease Neg Hx   . Pancreatic cancer Neg Hx   . Rectal cancer Neg Hx   . Stomach cancer Neg Hx     Social History   Tobacco Use  . Smoking status: Never Smoker  . Smokeless tobacco: Never Used  Vaping Use  . Vaping Use: Never used  Substance Use Topics  . Alcohol use: No  . Drug use: No    ROS   Objective:   Vitals: BP (!) 174/104 (BP Location: Right Arm)   Pulse 66   Temp (!) 97.5 F (36.4 C) (Oral)   SpO2 99%   Wt Readings from Last 3 Encounters:  10/03/19 253 lb 8.5 oz (115 kg)  07/06/19 237 lb (107.5 kg)  07/04/19 237 lb (107.5 kg)   Temp Readings from Last 3 Encounters:  10/04/19 (!) 97.5 F (36.4 C) (Oral)  10/03/19 98.7 F (37.1 C) (Oral)  07/06/19 98.5 F (36.9 C) (Oral)   BP Readings from Last 3 Encounters:  10/04/19 (!) 193/87  10/03/19 (!) 173/86  07/06/19 (!) 150/86   Pulse Readings from Last 3 Encounters:  10/04/19 68  10/03/19 74  07/06/19 70   Physical Exam Constitutional:      General: She is not in acute distress.    Appearance: Normal appearance. She is well-developed. She is obese. She is not ill-appearing, toxic-appearing or diaphoretic.     Comments: Appears lethargic.  HENT:     Head: Normocephalic and atraumatic.     Nose: Nose normal.     Mouth/Throat:     Mouth: Mucous membranes are moist.  Eyes:     Extraocular Movements: Extraocular movements intact.     Pupils: Pupils are equal, round, and reactive to light.  Cardiovascular:     Rate and Rhythm: Normal rate and regular rhythm.     Pulses: Normal pulses.     Heart sounds: Normal heart sounds. No murmur heard.  No friction rub. No gallop.   Pulmonary:     Effort: Pulmonary effort is normal. No respiratory distress.     Breath sounds: Normal breath sounds. No stridor. No wheezing, rhonchi or rales.  Skin:    General: Skin is warm and dry.     Coloration: Skin is pale.     Findings: No rash.  Neurological:     Mental  Status: She is alert and oriented to person, place, and time.  Psychiatric:        Mood and Affect: Mood normal.  Behavior: Behavior normal.        Thought Content: Thought content normal.    ED ECG REPORT   Date: 10/04/2019  Rate: 66 bpm  Rhythm: normal sinus rhythm  QRS Axis: normal  Intervals: normal  ST/T Wave abnormalities: normal  Conduction Disutrbances:none  Narrative Interpretation: Sinus rhythm at 66 bpm, no acute findings.  Very comparable to previous EKGs from yesterday while waiting in the emergency room.  Old EKG Reviewed: unchanged  I have personally reviewed the EKG tracing and agree with the computerized printout as noted.  DG Chest 2 View  Result Date: 10/03/2019 CLINICAL DATA:  Central chest pain and shortness of breath since yesterday. EXAM: CHEST - 2 VIEW COMPARISON:  05/10/2019 FINDINGS: The heart size and mediastinal contours are within normal limits. Both lungs are clear. The visualized skeletal structures are unremarkable. IMPRESSION: No active cardiopulmonary disease. Electronically Signed   By: Lucienne Capers M.D.   On: 10/03/2019 23:03   Results for orders placed or performed during the hospital encounter of 10/04/19 (from the past 24 hour(s))  CBC     Status: Abnormal   Collection Time: 10/04/19  8:00 PM  Result Value Ref Range   WBC 6.9 4.0 - 10.5 K/uL   RBC 3.37 (L) 3.87 - 5.11 MIL/uL   Hemoglobin 8.2 (L) 12.0 - 15.0 g/dL   HCT 27.4 (L) 36 - 46 %   MCV 81.3 80.0 - 100.0 fL   MCH 24.3 (L) 26.0 - 34.0 pg   MCHC 29.9 (L) 30.0 - 36.0 g/dL   RDW 21.2 (H) 11.5 - 15.5 %   Platelets 316 150 - 400 K/uL   nRBC 0.7 (H) 0.0 - 0.2 %     Assessment and Plan :   PDMP not reviewed this encounter.  1. Acute on chronic anemia   2. Shortness of breath   3. Dizziness   4. ESRD on dialysis ([place])   5. Peritoneal dialysis status [facility])     Patient has severe acute on chronic anemia.  Patient has an increase in her blood pressure, I am concerned  about the strain that the severe anemia is having in her heart.  Her EKG was reassuring but emphasized need for evaluation in the emergency room given her severely decreased hemoglobin and hematocrit.  She has recently gotten a blood transfusion in July.  Counseled that this may be in order again.  She contracts for safety and will report to the emergency room now.   Jaynee Eagles, [place] 10/04/19 2052

## 2019-12-14 ENCOUNTER — Ambulatory Visit: Payer: Self-pay | Admitting: *Deleted

## 2019-12-14 NOTE — Telephone Encounter (Signed)
  Patient would like to speak with nurse on if grandchildern should be tested for covid after daughter has tested positive although grandchildren are showing no symptoms. Please advise  Patient reports she and her granddaughter  were expose to her daughter that was positive for covid and is now coming off of quarantine. No exposed persons are symptomatic at this time. Recommended testing for exposed persons. Testing appt. Set up for 3 patients , patient and her 2 granddaughters for tomorrow 12/15/19 at [facility] [employer] at 0900am.Patient educated on isolation precautions until testing results noted. Care advise given. Patient verbalized understanding of care advise and to call back if symptoms noted.   Reason for Disposition . [1] CLOSE CONTACT COVID-19 EXPOSURE within last 14 days AND [2] needs COVID-19 lab test to return to work AND [3] NO symptoms  Answer Assessment - Initial Assessment Questions 1. COVID-19 EXPOSURE: "Please describe how you were exposed to someone with a COVID-19 infection."     Same household  2. PLACE of CONTACT: "Where were you when you were exposed to COVID-19?" (e.g., home, school, medical waiting room; which city?)     Home  3. TYPE of CONTACT: "How much contact was there?" (e.g., sitting next to, live in same house, work in same office, same building)     Live in same house sitting next to . 4. DURATION of CONTACT: "How long were you in contact with the COVID-19 patient?" (e.g., a few seconds, passed by person, a few minutes, 15 minutes or longer, live with the patient)     Live with patient 5. MASK: "Were you wearing a mask?" "Was the other person wearing a mask?" Note: wearing a mask reduces the risk of an otherwise close contact.     no 6. DATE of CONTACT: "When did you have contact with a COVID-19 patient?" (e.g., how many days ago)     With in the past 14 days  7. COMMUNITY SPREAD: "Are there lots of cases of COVID-19 (community spread) where you live?" (See  public health department website, if unsure)       Family member positive  8. SYMPTOMS: "Do you have any symptoms?" (e.g., fever, cough, breathing difficulty, loss of taste or smell)     no 9. PREGNANCY OR POSTPARTUM: "Is there any chance you are pregnant?" "When was your last menstrual period?" "Did you deliver in the last 2 weeks?"     na 10. HIGH RISK: "Do you have any heart or lung problems?" "Do you have a weak immune system?" (e.g., heart failure, COPD, asthma, HIV positive, chemotherapy, renal failure, diabetes mellitus, sickle cell anemia, obesity)       ESRD. 11. TRAVEL: "Have you traveled out of the country recently?" If Yes, ask: "When and where?" Also ask about out-of-state travel, since the CDC has identified some high-risk cities for community spread in the [place]. Note: Travel becomes less relevant if there is widespread community transmission where the patient lives.       na  Protocols used: CORONAVIRUS (COVID-19) EXPOSURE-A-AH

## 2019-12-15 ENCOUNTER — Other Ambulatory Visit: Payer: Medicare Other

## 2019-12-15 DIAGNOSIS — Z20822 Contact with and (suspected) exposure to covid-19: Secondary | ICD-10-CM

## 2019-12-16 LAB — SARS-COV-2, NAA 2 DAY TAT

## 2019-12-16 LAB — NOVEL CORONAVIRUS, NAA: SARS-CoV-2, NAA: NOT DETECTED

## 2020-02-05 ENCOUNTER — Inpatient Hospital Stay (HOSPITAL_COMMUNITY)
Admission: EM | Admit: 2020-02-05 | Discharge: 2020-02-16 | DRG: 871 | Disposition: A | Discharge status: Home / Self Care | Payer: Medicare Other | Attending: Internal Medicine | Admitting: Internal Medicine

## 2020-02-05 ENCOUNTER — Other Ambulatory Visit: Payer: Self-pay

## 2020-02-05 DIAGNOSIS — Z886 Allergy status to analgesic agent status: Secondary | ICD-10-CM

## 2020-02-05 DIAGNOSIS — K625 Hemorrhage of anus and rectum: Secondary | ICD-10-CM | POA: Diagnosis not present

## 2020-02-05 DIAGNOSIS — E8889 Other specified metabolic disorders: Secondary | ICD-10-CM | POA: Diagnosis present

## 2020-02-05 DIAGNOSIS — Z79899 Other long term (current) drug therapy: Secondary | ICD-10-CM

## 2020-02-05 DIAGNOSIS — N186 End stage renal disease: Secondary | ICD-10-CM

## 2020-02-05 DIAGNOSIS — Z8249 Family history of ischemic heart disease and other diseases of the circulatory system: Secondary | ICD-10-CM

## 2020-02-05 DIAGNOSIS — N764 Abscess of vulva: Secondary | ICD-10-CM | POA: Diagnosis present

## 2020-02-05 DIAGNOSIS — E7800 Pure hypercholesterolemia, unspecified: Secondary | ICD-10-CM | POA: Diagnosis present

## 2020-02-05 DIAGNOSIS — K922 Gastrointestinal hemorrhage, unspecified: Secondary | ICD-10-CM | POA: Diagnosis present

## 2020-02-05 DIAGNOSIS — R7982 Elevated C-reactive protein (CRP): Secondary | ICD-10-CM | POA: Diagnosis present

## 2020-02-05 DIAGNOSIS — H579 Unspecified disorder of eye and adnexa: Secondary | ICD-10-CM

## 2020-02-05 DIAGNOSIS — D638 Anemia in other chronic diseases classified elsewhere: Secondary | ICD-10-CM | POA: Diagnosis present

## 2020-02-05 DIAGNOSIS — Z6833 Body mass index (BMI) 33.0-33.9, adult: Secondary | ICD-10-CM

## 2020-02-05 DIAGNOSIS — I120 Hypertensive chronic kidney disease with stage 5 chronic kidney disease or end stage renal disease: Secondary | ICD-10-CM | POA: Diagnosis present

## 2020-02-05 DIAGNOSIS — M109 Gout, unspecified: Secondary | ICD-10-CM | POA: Diagnosis present

## 2020-02-05 DIAGNOSIS — E669 Obesity, unspecified: Secondary | ICD-10-CM | POA: Diagnosis present

## 2020-02-05 DIAGNOSIS — Z8673 Personal history of transient ischemic attack (TIA), and cerebral infarction without residual deficits: Secondary | ICD-10-CM

## 2020-02-05 DIAGNOSIS — A419 Sepsis, unspecified organism: Principal | ICD-10-CM | POA: Diagnosis present

## 2020-02-05 DIAGNOSIS — Z20822 Contact with and (suspected) exposure to covid-19: Secondary | ICD-10-CM | POA: Diagnosis present

## 2020-02-05 DIAGNOSIS — D62 Acute posthemorrhagic anemia: Secondary | ICD-10-CM | POA: Diagnosis present

## 2020-02-05 DIAGNOSIS — D649 Anemia, unspecified: Secondary | ICD-10-CM

## 2020-02-05 DIAGNOSIS — R251 Tremor, unspecified: Secondary | ICD-10-CM | POA: Diagnosis present

## 2020-02-05 DIAGNOSIS — Z888 Allergy status to other drugs, medicaments and biological substances status: Secondary | ICD-10-CM

## 2020-02-05 DIAGNOSIS — E8770 Fluid overload, unspecified: Secondary | ICD-10-CM | POA: Diagnosis present

## 2020-02-05 DIAGNOSIS — Z992 Dependence on renal dialysis: Secondary | ICD-10-CM

## 2020-02-05 DIAGNOSIS — I10 Essential (primary) hypertension: Secondary | ICD-10-CM | POA: Diagnosis present

## 2020-02-05 DIAGNOSIS — K5520 Angiodysplasia of colon without hemorrhage: Secondary | ICD-10-CM | POA: Diagnosis present

## 2020-02-05 DIAGNOSIS — K5721 Diverticulitis of large intestine with perforation and abscess with bleeding: Secondary | ICD-10-CM | POA: Diagnosis present

## 2020-02-05 DIAGNOSIS — Z23 Encounter for immunization: Secondary | ICD-10-CM

## 2020-02-05 DIAGNOSIS — Z8711 Personal history of peptic ulcer disease: Secondary | ICD-10-CM

## 2020-02-05 DIAGNOSIS — K630 Abscess of intestine: Secondary | ICD-10-CM | POA: Diagnosis present

## 2020-02-05 DIAGNOSIS — H518 Other specified disorders of binocular movement: Secondary | ICD-10-CM | POA: Diagnosis present

## 2020-02-05 DIAGNOSIS — E876 Hypokalemia: Secondary | ICD-10-CM | POA: Diagnosis present

## 2020-02-05 DIAGNOSIS — N2581 Secondary hyperparathyroidism of renal origin: Secondary | ICD-10-CM | POA: Diagnosis present

## 2020-02-05 DIAGNOSIS — K219 Gastro-esophageal reflux disease without esophagitis: Secondary | ICD-10-CM | POA: Diagnosis present

## 2020-02-05 DIAGNOSIS — G629 Polyneuropathy, unspecified: Secondary | ICD-10-CM | POA: Diagnosis present

## 2020-02-05 LAB — COMPREHENSIVE METABOLIC PANEL
ALT: 13 U/L (ref 0–44)
AST: 18 U/L (ref 15–41)
Albumin: 2.8 g/dL — ABNORMAL LOW (ref 3.5–5.0)
Alkaline Phosphatase: 59 U/L (ref 38–126)
Anion gap: 16 — ABNORMAL HIGH (ref 5–15)
BUN: 19 mg/dL (ref 6–20)
CO2: 26 mmol/L (ref 22–32)
Calcium: 9.3 mg/dL (ref 8.9–10.3)
Chloride: 91 mmol/L — ABNORMAL LOW (ref 98–111)
Creatinine, Ser: 7.99 mg/dL — ABNORMAL HIGH (ref 0.44–1.00)
GFR, Estimated: 6 mL/min — ABNORMAL LOW (ref >60)
Glucose, Bld: 95 mg/dL (ref 70–99)
Potassium: 3.4 mmol/L — ABNORMAL LOW (ref 3.5–5.1)
Sodium: 133 mmol/L — ABNORMAL LOW (ref 135–145)
Total Bilirubin: 0.7 mg/dL (ref 0.3–1.2)
Total Protein: 7.2 g/dL (ref 6.5–8.1)

## 2020-02-05 LAB — POC SARS CORONAVIRUS 2 AG -  ED
SARS Coronavirus 2 Ag: NEGATIVE
SARS Coronavirus 2 Ag: NEGATIVE

## 2020-02-05 LAB — CBC
HCT: 23.8 % — ABNORMAL LOW (ref 36.0–46.0)
Hemoglobin: 6.9 g/dL — CL (ref 12.0–15.0)
MCH: 27.1 pg (ref 26.0–34.0)
MCHC: 29 g/dL — ABNORMAL LOW (ref 30.0–36.0)
MCV: 93.3 fL (ref 80.0–100.0)
Platelets: 277 10*3/uL (ref 150–400)
RBC: 2.55 MIL/uL — ABNORMAL LOW (ref 3.87–5.11)
RDW: 15.9 % — ABNORMAL HIGH (ref 11.5–15.5)
WBC: 14.5 10*3/uL — ABNORMAL HIGH (ref 4.0–10.5)
nRBC: 0 % (ref 0.0–0.2)

## 2020-02-05 MED ORDER — ACETAMINOPHEN 500 MG PO TABS
1000.0000 mg | ORAL_TABLET | Freq: Once | ORAL | Status: AC
  Administered 2020-02-05: 1000 mg via ORAL
  Filled 2020-02-05: qty 2

## 2020-02-05 NOTE — ED Notes (Signed)
Dr Dayna Barker notified of Hgb 6.9

## 2020-02-05 NOTE — ED Triage Notes (Signed)
Pt MWF dialysis patient, had full tx today. Having copious amounts of rectal bleeding. Endorses abdominal pain, shob, and generalized weakness. Febrile in triage.

## 2020-02-06 ENCOUNTER — Encounter (HOSPITAL_COMMUNITY): Payer: Self-pay | Admitting: Family Medicine

## 2020-02-06 ENCOUNTER — Emergency Department (HOSPITAL_COMMUNITY): Payer: Medicare Other

## 2020-02-06 DIAGNOSIS — D62 Acute posthemorrhagic anemia: Secondary | ICD-10-CM

## 2020-02-06 DIAGNOSIS — I120 Hypertensive chronic kidney disease with stage 5 chronic kidney disease or end stage renal disease: Secondary | ICD-10-CM | POA: Diagnosis present

## 2020-02-06 DIAGNOSIS — I151 Hypertension secondary to other renal disorders: Secondary | ICD-10-CM | POA: Diagnosis not present

## 2020-02-06 DIAGNOSIS — R935 Abnormal findings on diagnostic imaging of other abdominal regions, including retroperitoneum: Secondary | ICD-10-CM

## 2020-02-06 DIAGNOSIS — K921 Melena: Secondary | ICD-10-CM | POA: Diagnosis not present

## 2020-02-06 DIAGNOSIS — M109 Gout, unspecified: Secondary | ICD-10-CM | POA: Diagnosis present

## 2020-02-06 DIAGNOSIS — R7982 Elevated C-reactive protein (CRP): Secondary | ICD-10-CM | POA: Diagnosis present

## 2020-02-06 DIAGNOSIS — K625 Hemorrhage of anus and rectum: Secondary | ICD-10-CM | POA: Diagnosis present

## 2020-02-06 DIAGNOSIS — K651 Peritoneal abscess: Secondary | ICD-10-CM | POA: Diagnosis not present

## 2020-02-06 DIAGNOSIS — D649 Anemia, unspecified: Secondary | ICD-10-CM

## 2020-02-06 DIAGNOSIS — Z888 Allergy status to other drugs, medicaments and biological substances status: Secondary | ICD-10-CM | POA: Diagnosis not present

## 2020-02-06 DIAGNOSIS — R933 Abnormal findings on diagnostic imaging of other parts of digestive tract: Secondary | ICD-10-CM | POA: Diagnosis not present

## 2020-02-06 DIAGNOSIS — E669 Obesity, unspecified: Secondary | ICD-10-CM | POA: Diagnosis present

## 2020-02-06 DIAGNOSIS — Z6833 Body mass index (BMI) 33.0-33.9, adult: Secondary | ICD-10-CM | POA: Diagnosis not present

## 2020-02-06 DIAGNOSIS — E7800 Pure hypercholesterolemia, unspecified: Secondary | ICD-10-CM | POA: Diagnosis present

## 2020-02-06 DIAGNOSIS — N186 End stage renal disease: Secondary | ICD-10-CM | POA: Diagnosis present

## 2020-02-06 DIAGNOSIS — Z992 Dependence on renal dialysis: Secondary | ICD-10-CM

## 2020-02-06 DIAGNOSIS — K5791 Diverticulosis of intestine, part unspecified, without perforation or abscess with bleeding: Secondary | ICD-10-CM | POA: Diagnosis not present

## 2020-02-06 DIAGNOSIS — N2889 Other specified disorders of kidney and ureter: Secondary | ICD-10-CM

## 2020-02-06 DIAGNOSIS — Z886 Allergy status to analgesic agent status: Secondary | ICD-10-CM | POA: Diagnosis not present

## 2020-02-06 DIAGNOSIS — Z20822 Contact with and (suspected) exposure to covid-19: Secondary | ICD-10-CM | POA: Diagnosis present

## 2020-02-06 DIAGNOSIS — Z8673 Personal history of transient ischemic attack (TIA), and cerebral infarction without residual deficits: Secondary | ICD-10-CM | POA: Diagnosis not present

## 2020-02-06 DIAGNOSIS — K5790 Diverticulosis of intestine, part unspecified, without perforation or abscess without bleeding: Secondary | ICD-10-CM

## 2020-02-06 DIAGNOSIS — G629 Polyneuropathy, unspecified: Secondary | ICD-10-CM | POA: Diagnosis present

## 2020-02-06 DIAGNOSIS — K5721 Diverticulitis of large intestine with perforation and abscess with bleeding: Secondary | ICD-10-CM | POA: Diagnosis present

## 2020-02-06 DIAGNOSIS — N764 Abscess of vulva: Secondary | ICD-10-CM | POA: Diagnosis present

## 2020-02-06 DIAGNOSIS — Z8711 Personal history of peptic ulcer disease: Secondary | ICD-10-CM | POA: Diagnosis not present

## 2020-02-06 DIAGNOSIS — Z79899 Other long term (current) drug therapy: Secondary | ICD-10-CM | POA: Diagnosis not present

## 2020-02-06 DIAGNOSIS — K630 Abscess of intestine: Secondary | ICD-10-CM | POA: Diagnosis not present

## 2020-02-06 DIAGNOSIS — N2581 Secondary hyperparathyroidism of renal origin: Secondary | ICD-10-CM | POA: Diagnosis present

## 2020-02-06 DIAGNOSIS — E876 Hypokalemia: Secondary | ICD-10-CM | POA: Diagnosis present

## 2020-02-06 DIAGNOSIS — A419 Sepsis, unspecified organism: Secondary | ICD-10-CM | POA: Diagnosis present

## 2020-02-06 DIAGNOSIS — K219 Gastro-esophageal reflux disease without esophagitis: Secondary | ICD-10-CM | POA: Diagnosis present

## 2020-02-06 DIAGNOSIS — Z23 Encounter for immunization: Secondary | ICD-10-CM | POA: Diagnosis present

## 2020-02-06 DIAGNOSIS — R569 Unspecified convulsions: Secondary | ICD-10-CM | POA: Diagnosis not present

## 2020-02-06 DIAGNOSIS — Z8249 Family history of ischemic heart disease and other diseases of the circulatory system: Secondary | ICD-10-CM | POA: Diagnosis not present

## 2020-02-06 LAB — BASIC METABOLIC PANEL
Anion gap: 15 (ref 5–15)
BUN: 26 mg/dL — ABNORMAL HIGH (ref 6–20)
CO2: 25 mmol/L (ref 22–32)
Calcium: 9 mg/dL (ref 8.9–10.3)
Chloride: 93 mmol/L — ABNORMAL LOW (ref 98–111)
Creatinine, Ser: 9.08 mg/dL — ABNORMAL HIGH (ref 0.44–1.00)
GFR, Estimated: 5 mL/min — ABNORMAL LOW (ref >60)
Glucose, Bld: 80 mg/dL (ref 70–99)
Potassium: 3.6 mmol/L (ref 3.5–5.1)
Sodium: 133 mmol/L — ABNORMAL LOW (ref 135–145)

## 2020-02-06 LAB — CBC WITH DIFFERENTIAL/PLATELET
Abs Immature Granulocytes: 0.12 10*3/uL — ABNORMAL HIGH (ref 0.00–0.07)
Basophils Absolute: 0.1 10*3/uL (ref 0.0–0.1)
Basophils Relative: 0 %
Eosinophils Absolute: 0.2 10*3/uL (ref 0.0–0.5)
Eosinophils Relative: 1 %
HCT: 23.3 % — ABNORMAL LOW (ref 36.0–46.0)
Hemoglobin: 7.3 g/dL — ABNORMAL LOW (ref 12.0–15.0)
Immature Granulocytes: 1 %
Lymphocytes Relative: 12 %
Lymphs Abs: 1.6 10*3/uL (ref 0.7–4.0)
MCH: 27.5 pg (ref 26.0–34.0)
MCHC: 31.3 g/dL (ref 30.0–36.0)
MCV: 87.9 fL (ref 80.0–100.0)
Monocytes Absolute: 1.1 10*3/uL — ABNORMAL HIGH (ref 0.1–1.0)
Monocytes Relative: 8 %
Neutro Abs: 10.8 10*3/uL — ABNORMAL HIGH (ref 1.7–7.7)
Neutrophils Relative %: 78 %
Platelets: 259 10*3/uL (ref 150–400)
RBC: 2.65 MIL/uL — ABNORMAL LOW (ref 3.87–5.11)
RDW: 15 % (ref 11.5–15.5)
WBC: 13.8 10*3/uL — ABNORMAL HIGH (ref 4.0–10.5)
nRBC: 0 % (ref 0.0–0.2)

## 2020-02-06 LAB — CBC
HCT: 24.1 % — ABNORMAL LOW (ref 36.0–46.0)
Hemoglobin: 8 g/dL — ABNORMAL LOW (ref 12.0–15.0)
MCH: 28.5 pg (ref 26.0–34.0)
MCHC: 33.2 g/dL (ref 30.0–36.0)
MCV: 85.8 fL (ref 80.0–100.0)
Platelets: 289 10*3/uL (ref 150–400)
RBC: 2.81 MIL/uL — ABNORMAL LOW (ref 3.87–5.11)
RDW: 15.1 % (ref 11.5–15.5)
WBC: 14.8 10*3/uL — ABNORMAL HIGH (ref 4.0–10.5)
nRBC: 0 % (ref 0.0–0.2)

## 2020-02-06 LAB — POC OCCULT BLOOD, ED: Fecal Occult Bld: POSITIVE — AB

## 2020-02-06 LAB — HEMOGLOBIN AND HEMATOCRIT, BLOOD
HCT: 21.2 % — ABNORMAL LOW (ref 36.0–46.0)
Hemoglobin: 6.5 g/dL — CL (ref 12.0–15.0)

## 2020-02-06 LAB — PREPARE RBC (CROSSMATCH)

## 2020-02-06 LAB — HIV ANTIBODY (ROUTINE TESTING W REFLEX): HIV Screen 4th Generation wRfx: NONREACTIVE

## 2020-02-06 LAB — SARS CORONAVIRUS 2 BY RT PCR (HOSPITAL ORDER, PERFORMED IN ~~LOC~~ HOSPITAL LAB): SARS Coronavirus 2: NEGATIVE

## 2020-02-06 MED ORDER — ACETAMINOPHEN 325 MG PO TABS
650.0000 mg | ORAL_TABLET | Freq: Four times a day (QID) | ORAL | Status: DC | PRN
  Administered 2020-02-06 – 2020-02-14 (×3): 650 mg via ORAL
  Filled 2020-02-06 (×4): qty 2

## 2020-02-06 MED ORDER — PIPERACILLIN-TAZOBACTAM IN DEX 2-0.25 GM/50ML IV SOLN
2.2500 g | Freq: Three times a day (TID) | INTRAVENOUS | Status: DC
  Administered 2020-02-06 – 2020-02-16 (×30): 2.25 g via INTRAVENOUS
  Filled 2020-02-06 (×32): qty 50

## 2020-02-06 MED ORDER — PIPERACILLIN-TAZOBACTAM 3.375 G IVPB: 3.3750 g | Freq: Two times a day (BID) | INTRAVENOUS | Status: DC

## 2020-02-06 MED ORDER — ALPRAZOLAM 0.25 MG PO TABS
0.5000 mg | ORAL_TABLET | Freq: Once | ORAL | Status: AC
  Administered 2020-02-06: 0.5 mg via ORAL
  Filled 2020-02-06: qty 2

## 2020-02-06 MED ORDER — ONDANSETRON HCL 4 MG/2ML IJ SOLN
4.0000 mg | Freq: Once | INTRAMUSCULAR | Status: AC
  Administered 2020-02-06: 4 mg via INTRAVENOUS
  Filled 2020-02-06: qty 2

## 2020-02-06 MED ORDER — ALPRAZOLAM 0.5 MG PO TABS
1.0000 mg | ORAL_TABLET | Freq: Once | ORAL | Status: AC
  Administered 2020-02-07: 1 mg via ORAL
  Filled 2020-02-06: qty 2

## 2020-02-06 MED ORDER — FENTANYL CITRATE (PF) 100 MCG/2ML IJ SOLN
50.0000 ug | Freq: Once | INTRAMUSCULAR | Status: AC
  Administered 2020-02-06: 50 ug via INTRAVENOUS
  Filled 2020-02-06: qty 2

## 2020-02-06 MED ORDER — FENTANYL CITRATE (PF) 100 MCG/2ML IJ SOLN
25.0000 ug | INTRAMUSCULAR | Status: DC | PRN
  Administered 2020-02-06: 50 ug via INTRAVENOUS
  Administered 2020-02-07: 25 ug via INTRAVENOUS
  Filled 2020-02-06 (×2): qty 2

## 2020-02-06 MED ORDER — SODIUM CHLORIDE 0.9% IV SOLUTION: Freq: Once | INTRAVENOUS | Status: AC

## 2020-02-06 MED ORDER — PIPERACILLIN-TAZOBACTAM 3.375 G IVPB 30 MIN
3.3750 g | Freq: Once | INTRAVENOUS | Status: AC
  Administered 2020-02-06: 3.375 g via INTRAVENOUS
  Filled 2020-02-06: qty 50

## 2020-02-06 MED ORDER — ACETAMINOPHEN 650 MG RE SUPP: 650.0000 mg | Freq: Four times a day (QID) | RECTAL | Status: DC | PRN

## 2020-02-06 MED ORDER — ONDANSETRON HCL 4 MG PO TABS
4.0000 mg | ORAL_TABLET | Freq: Four times a day (QID) | ORAL | Status: DC | PRN
  Administered 2020-02-10 – 2020-02-14 (×7): 4 mg via ORAL
  Filled 2020-02-06 (×8): qty 1

## 2020-02-06 MED ORDER — ONDANSETRON HCL 4 MG/2ML IJ SOLN
4.0000 mg | Freq: Four times a day (QID) | INTRAMUSCULAR | Status: DC | PRN
  Administered 2020-02-06 – 2020-02-15 (×7): 4 mg via INTRAVENOUS
  Filled 2020-02-06 (×8): qty 2

## 2020-02-06 MED ORDER — CHLORHEXIDINE GLUCONATE CLOTH 2 % EX PADS
6.0000 | MEDICATED_PAD | Freq: Every day | CUTANEOUS | Status: DC
  Administered 2020-02-06 – 2020-02-08 (×3): 6 via TOPICAL

## 2020-02-06 MED ORDER — IOHEXOL 300 MG/ML  SOLN
100.0000 mL | Freq: Once | INTRAMUSCULAR | Status: AC | PRN
  Administered 2020-02-06: 100 mL via INTRAVENOUS

## 2020-02-06 MED ORDER — LABETALOL HCL 5 MG/ML IV SOLN
10.0000 mg | INTRAVENOUS | Status: DC | PRN
  Administered 2020-02-06 – 2020-02-07 (×3): 10 mg via INTRAVENOUS
  Filled 2020-02-06 (×3): qty 4

## 2020-02-06 MED ORDER — SODIUM CHLORIDE 0.9 % IV SOLN
INTRAVENOUS | Status: DC | PRN
  Administered 2020-02-06: 250 mL via INTRAVENOUS
  Administered 2020-02-12 – 2020-02-14 (×2): 500 mL via INTRAVENOUS
  Administered 2020-02-14 – 2020-02-16 (×2): 250 mL via INTRAVENOUS

## 2020-02-06 NOTE — Progress Notes (Signed)
PROGRESS NOTE    Khristi Schiller  SKA:[id number] DOB: [birth year] DOA: 02/05/2020 PCP: Doyle Askew, PA-C   Brief Narrative:  Patricia [name] is a 45 y.o. female with medical history significant for ESRD on hemodialysis MWF, hypertension, anxiety, and history of recurrent GI bleeding, now presenting to the emergency department for evaluation of abdominal pain and rectal bleeding.  Patient reports 1 day of pain in the lower abdomen, fatigue, exertional dyspnea, and rectal bleeding.  She describes having an urge to defecate and passing bright red blood and clots.  She has had several episodes of this bleeding.  The pain has been constant for the past day, was worse after she ate something, and so she has not eaten anything since yesterday.  She denies similar symptoms previously and notes that she had dark stool with her previous GI bleeds.  Patient has had multiple endoscopic evaluations with GI revealing multiple issues including distal small bowel AVMs, nonbleeding gastric ulcers, erosive gastritis, duodenitis, diverticulosis and polyps. Upon arrival to the ED fecal occult blood testing was found to be positive.  CT abdomen shows pericolonic abscess.  Surgery and GI contacted by ED physician for further evaluation and treatment, hospitalist group called for admission.  Assessment & Plan:   Principal Problem:   Pericolonic abscess Active Problems:   Hypertension   Symptomatic anemia   GI bleed   Acute on chronic blood loss anemia   Anemia   ESRD on hemodialysis (HCC)  Sepsis secondary to pericolonic abscess, POA  - 3.3 x 2.3 cm pericolonic abscess on CT  - Abscess currently not amenable to drainage, appreciate GI and surgery evaluation and treatment -Continue conservative management currently with IV Zosyn, bowel rest IV fluids and symptom control.  Likely advance diet per GI to clear liquids as tolerated.    Lower GI bleeding likely secondary to above with acute  symptomatic blood loss anemia on chronic anemia of chronic disease, POA   - Appears to be recurrent given patient's history of endoscopy as above - GI following, appreciate insight recommendations, continue conservative management transfuse if hemoglobin less than 7 or symptomatic.  ESRD MWF - She reports completing HD without incident on 02/05/20  - No indication for urgent HD on admission  - Nephrology following, appreciate insight and recommendations   Hypertension  - Labetalol as needed for now     DVT prophylaxis: SCDs Code Status: Full  Family Communication: None  Status is: Inpatient  Dispo: The patient is from: Home              Anticipated d/c is to: Home              Anticipated d/c date is: > 72 hours              Patient currently not medically stable for discharge  Consultants:   GI, general surgery, nephrology  Procedures:   None  Antimicrobials:  Zosyn, 02/05/2020, ongoing  Subjective: No acute issues or events overnight, abdominal pain resolving, no further episodes of bowel movement, unclear if still melanotic or bright red.  Patient otherwise feels quite well other than just generalized fatigue denies nausea, vomiting, headache, fevers, chills, chest pain, shortness of breath.  Objective: Vitals:   02/06/20 0200 02/06/20 0245 02/06/20 0446 02/06/20 0501  BP: (!) 194/90 (!) 188/89 (!) 167/75 (!) 170/75  Pulse: 88 87 90 94  Resp: $Remo'19 18 20 10  'KFYHw$ Temp:   99.2 F (37.3 C) 99.2 F (37.3 C)  TempSrc:   Oral Oral  SpO2: 100% 100%  94%  Weight:      Height:        Intake/Output Summary (Last 24 hours) at 02/06/2020 0757 Last data filed at 02/06/2020 0415 Gross per 24 hour  Intake 50 ml  Output -  Net 50 ml   Filed Weights   02/05/20 1831  Weight: 103.4 kg    Examination:  General exam: Appears calm and comfortable  Respiratory system: Clear to auscultation. Respiratory effort normal. Cardiovascular system: S1 & S2 heard, RRR. No JVD, murmurs,  rubs, gallops or clicks. No pedal edema. Gastrointestinal system: Abdomen is nondistended, soft and nontender. No organomegaly or masses felt. Normal bowel sounds heard. Central nervous system: Alert and oriented. No focal neurological deficits. Extremities: Symmetric 5 x 5 power. Skin: No rashes, lesions or ulcers Psychiatry: Judgement and insight appear normal. Mood & affect appropriate.     Data Reviewed: I have personally reviewed following labs and imaging studies  CBC: Recent Labs  Lab 02/05/20 1850 02/06/20 0120  WBC 14.5*  --   HGB 6.9* 6.5*  HCT 23.8* 21.2*  MCV 93.3  --   PLT 277  --    Basic Metabolic Panel: Recent Labs  Lab 02/05/20 1850  NA 133*  K 3.4*  CL 91*  CO2 26  GLUCOSE 95  BUN 19  CREATININE 7.99*  CALCIUM 9.3   GFR: Estimated Creatinine Clearance: 10.9 mL/min (A) (by C-G formula based on SCr of 7.99 mg/dL (H)). Liver Function Tests: Recent Labs  Lab 02/05/20 1850  AST 18  ALT 13  ALKPHOS 59  BILITOT 0.7  PROT 7.2  ALBUMIN 2.8*   No results for input(s): LIPASE, AMYLASE in the last 168 hours. No results for input(s): AMMONIA in the last 168 hours. Coagulation Profile: No results for input(s): INR, PROTIME in the last 168 hours. Cardiac Enzymes: No results for input(s): CKTOTAL, CKMB, CKMBINDEX, TROPONINI in the last 168 hours. BNP (last 3 results) No results for input(s): PROBNP in the last 8760 hours. HbA1C: No results for input(s): HGBA1C in the last 72 hours. CBG: No results for input(s): GLUCAP in the last 168 hours. Lipid Profile: No results for input(s): CHOL, HDL, LDLCALC, TRIG, CHOLHDL, LDLDIRECT in the last 72 hours. Thyroid Function Tests: No results for input(s): TSH, T4TOTAL, FREET4, T3FREE, THYROIDAB in the last 72 hours. Anemia Panel: No results for input(s): VITAMINB12, FOLATE, FERRITIN, TIBC, IRON, RETICCTPCT in the last 72 hours. Sepsis Labs: No results for input(s): PROCALCITON, LATICACIDVEN in the last 168  hours.  Recent Results (from the past 240 hour(s))  SARS Coronavirus 2 by RT PCR (hospital order, performed in [facility] hospital lab) Nasopharyngeal Nasopharyngeal Swab     Status: None   Collection Time: 02/06/20  1:29 AM   Specimen: Nasopharyngeal Swab  Result Value Ref Range Status   SARS Coronavirus 2 NEGATIVE NEGATIVE Final    Comment: (NOTE) SARS-CoV-2 target nucleic acids are NOT DETECTED.  The SARS-CoV-2 RNA is generally detectable in upper and lower respiratory specimens during the acute phase of infection. The lowest concentration of SARS-CoV-2 viral copies this assay can detect is 250 copies / mL. A negative result does not preclude SARS-CoV-2 infection and should not be used as the sole basis for treatment or other patient management decisions.  A negative result may occur with improper specimen collection / handling, submission of specimen other than nasopharyngeal swab, presence of viral mutation(s) within the areas targeted by this assay, and inadequate  number of viral copies (<250 copies / mL). A negative result must be combined with clinical observations, patient history, and epidemiological information.  Fact Sheet for Patients:   StrictlyIdeas.no  Fact Sheet for Healthcare Providers: BankingDealers.co.za  This test is not yet approved or  cleared by the [place] FDA and has been authorized for detection and/or diagnosis of SARS-CoV-2 by FDA under an Emergency Use Authorization (EUA).  This EUA will remain in effect (meaning this test can be used) for the duration of the COVID-19 declaration under Section 564(b)(1) of the Act, 21 U.S.C. section 360bbb-3(b)(1), unless the authorization is terminated or revoked sooner.  Performed at [facility], [place] [address], [place], [place] [address]          Radiology Studies: CT Abdomen Pelvis W Contrast  Result Date: 02/06/2020 CLINICAL DATA:  Rectal  bleeding and abdominal pain EXAM: CT ABDOMEN AND PELVIS WITH CONTRAST TECHNIQUE: Multidetector CT imaging of the abdomen and pelvis was performed using the standard protocol following bolus administration of intravenous contrast. CONTRAST:  111mL OMNIPAQUE IOHEXOL 300 MG/ML  SOLN COMPARISON:  04/04/2018 FINDINGS: Lower chest: No acute abnormality. Hepatobiliary: No focal liver abnormality is seen. No gallstones, gallbladder wall thickening, or biliary dilatation. Pancreas: Unremarkable. No pancreatic ductal dilatation or surrounding inflammatory changes. Spleen: Normal in size without focal abnormality. Adrenals/Urinary Tract: Adrenal glands are within normal limits bilaterally. Kidneys demonstrate a normal enhancement pattern with small cysts bilaterally. No renal calculi or obstructive changes are seen. The bladder is decompressed. Stomach/Bowel: Diverticular change of the colon is noted. Along the posterior wall of the sigmoid colon interposed between the sigmoid and the rectum there is a 3.3 x 2.3 cm fluid collection which was not present on the prior exam and likely represents a small pericolonic abscess. No other inflammatory changes are seen. The appendix is prominent (13 mm) with some inspissated barium within although no inflammatory changes are seen. Small bowel and stomach appear within normal limits. Vascular/Lymphatic: Aortic atherosclerosis. No enlarged abdominal or pelvic lymph nodes. Reproductive: Status post hysterectomy. No adnexal masses. Other: Peritoneal dialysis catheter is noted. No significant residual dialysate is seen. Small fat containing anterior abdominal wall hernias are noted stable from the prior study. No herniation of bowel loops is seen. Musculoskeletal: No acute or significant osseous findings. IMPRESSION: Changes of diverticulosis with a fluid collection posterior to the sigmoid and anterior to the rectum as described suspicious for small pericolonic abscess likely arising from  the posterior wall of the sigmoid colon. Prominent appendix with inspissated barium although no inflammatory changes are seen. Stable chronic changes similar to that seen on previous exams. Electronically Signed   By: Inez Catalina M.D.   On: 02/06/2020 02:28   Scheduled Meds: . sodium chloride   Intravenous Once   Continuous Infusions: . piperacillin-tazobactam (ZOSYN)  IV      LOS: 0 days   Time spent: 40 min  Little Ishikawa, DO [facility]  If 7PM-7AM, please contact night-coverage [email or web address]  02/06/2020, 7:57 AM

## 2020-02-06 NOTE — Progress Notes (Signed)
PHARMACY NOTE:  ANTIMICROBIAL RENAL DOSAGE ADJUSTMENT  Current antimicrobial regimen includes a mismatch between antimicrobial dosage and estimated renal function.  As per policy approved by the Pharmacy & Therapeutics and Medical Executive Committees, the antimicrobial dosage will be adjusted accordingly.  Current antimicrobial dosage:  Zosyn 3.375g IV q12h (4h infusion)  Indication: pericolonic abscess  Renal Function:  $RemoveBe'[x]'[id number]$      On intermittent HD, scheduled: MWF     Antimicrobial dosage has been changed to:  Zosyn 2.25g IV q8h  Additional comments:    Arturo Morton, PharmD, BCPS Please check AMION for all [place] contact numbers Clinical Pharmacist 02/06/2020 8:08 AM

## 2020-02-06 NOTE — Consult Note (Signed)
[facility] Consult: 8:46 AM 02/06/2020  LOS: 0 days    Referring Provider: EDP Primary Care Physician:  Doyle Askew, PA-C Primary Gastroenterologist:  Dr. Rush Landmark  Reason for Consultation:   Abdominal pain, fever, rectal bleeding.  Pericolonic abscess.  Anemia.      HPI: Patricia [name] is a 45 y.o. female with PMH of ESRD on HD Monday Wednesday Friday.  Obesity.  GI bleeding.  Gastric ulcer.  Small bowel AVMs.  Diverticulosis.  Extensive GI evaluation as follows:  VCE 05/2018.  Nonbleeding small bowel AVMs beyond reach of conventional push enteroscopy.  Plan was to refer for double-balloon enteroscopy at [facility] Esophageal pathology mild reactive changes, no increased intraepithelial eosinophils.  Benign squamous mucosa.  No metaplasia, dysplasia, malignancy. EGD Feb 2020 -cm hiatal hernia, o/w unremarkable.    Dec 24, 2017  Endocapsule Study- Incomplete study. Capsule remained in small bowel 13 hours , 51 min. Two non-bleeding AVMS between 2 hours 34 min and 3 hours 9 minutes.  December 2019 SBE - non-bleeding clean based gastric ulcer and erosive gastropathy, duodenopathy. VCE placed.  Oct 2019 Endocapsule study [facility]) showed nodularity in distal small bowel.  August 2019 colonoscopy - complete exam with adequate prep.  -congested and erythematous mucosa in the sigmoid colon, in the transverse colon and in the ascending colon. Mucosal friability noted in ascending colon and cecum, no active bleeding. -Diverticulosis in the sigmoid colon and in the descending colon. -The distal rectum and anal verge are normal on retroflexion view. -Two 3 to 8 mm polyps in the transverse colon, removed piecemeal using a cold biopsyforceps. Resected and retrieved.  Path: Sessile serrated adenomas.    Developed pain in the lower abdomen, rectal bleeding with red blood and clots 24 hours prior to arrival.  Several episodes of hematochezia, about 6-7 episodes.  Pain is constant and worse after eating something yesterday.  Went to dialysis yesterday without issus.  No nausea or vomiting.  Home meds include omeprazole 40 mg bid.  Ferric citrate tid.  No NSAIDs, no aspirin products, platelet inhibitors or anticoagulation.  On presentation Hgb 6.5 grams.  Previously 7.5 -8.5 grams in May thru September 2022. WBC count 13.8.  Sodium 133.  LFTs normal.  Lipase normal.  Covid 19 negative.    CTAP w contrast.  Sigmoid diverticulosis.  Fluid collection suspicious for small abscess anterior to rectum arising from posterior sigmoid colon.  Prominent appendix with inspissated barium but no inflammation.  Stable, small fat-containing anterior abdominal wall hernias   Received units 1 unit PRBCs.  Started on IV zosyn.  Hgb up to 7.3 grams this AM.  Has not passed any blood since yesterday.   Past Medical History:  Diagnosis Date  . Acid reflux   . Anemia 04/2019   REQUIRING BLOOD TRANSFUSIONS  . Anxiety   . Arthritis   . Dyspnea   . Dysrhythmia    tachycardia  . ESRD (end stage renal disease) ([place])    [facility]- [place]  . GI bleeding 12/2017  . Gout   .  Headache(784.0)    "related to chemo; sometimes weekly" (09/12/2013)  . High cholesterol   . History of blood transfusion "a couple"   "related to low counts"  . Hypertension   . Iron deficiency anemia    "get epogen shots q month" (02/20/2014)  . 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 Peritoneal dialysis status ([place])   . Stroke [facility]) 08/15/2017    Past Surgical History:  Procedure Laterality Date  . ABDOMINAL HYSTERECTOMY  2010   "laparoscopic"  . ANKLE FRACTURE SURGERY Right 1994  . AV FISTULA PLACEMENT Left 04/14/2016   Procedure: ARTERIOVENOUS (AV) FISTULA CREATION;   Surgeon: Angelia Mould, MD;  Location: [facility] OR;  Service: Vascular;  Laterality: Left;  . AV FISTULA PLACEMENT Left 08/09/2017   Procedure: INSERTION OF ARTERIOVENOUS (AV) GORE-TEX GRAFT LEFT ARM;  Surgeon: Elam Dutch, MD;  Location: [place];  Service: Vascular;  Laterality: Left;  . [place] REMOVAL Left 08/11/2017   Procedure: REMOVAL OF ARTERIOVENOUS GORETEX GRAFT ([place]);  Surgeon: Serafina Mitchell, MD;  Location[facility] OR;  Service: Vascular;  Laterality: Left;  . BIOPSY  09/03/2017   Procedure: BIOPSY;  Surgeon: Otis Brace, MD;  Location: [place];  Service: Gastroenterology;;  . BIOPSY  12/24/2017   Procedure: BIOPSY;  Surgeon: Irving Copas., MD;  Location: [place];  Service: Gastroenterology;;  . CARDIAC CATHETERIZATION  2000's  . COLONOSCOPY WITH PROPOFOL N/A 09/04/2017   Procedure: COLONOSCOPY WITH PROPOFOL;  Surgeon: Ronnette Juniper, MD;  Location: [place];  Service: Gastroenterology;  Laterality: N/A;  . ENTEROSCOPY N/A 12/24/2017   Procedure: ENTEROSCOPY;  Surgeon: Rush Landmark Telford Nab., MD;  Location: [place];  Service: Gastroenterology;  Laterality: N/A;  . ESOPHAGOGASTRODUODENOSCOPY    . ESOPHAGOGASTRODUODENOSCOPY (EGD) WITH PROPOFOL Left 06/04/2017   Procedure: ESOPHAGOGASTRODUODENOSCOPY (EGD) WITH PROPOFOL;  Surgeon: Arta Silence, MD;  Location: [place];  Service: Endoscopy;  Laterality: Left;  . ESOPHAGOGASTRODUODENOSCOPY (EGD) WITH PROPOFOL N/A 09/03/2017   Procedure: ESOPHAGOGASTRODUODENOSCOPY (EGD) WITH PROPOFOL;  Surgeon: Otis Brace, MD;  Location: [place];  Service: Gastroenterology;  Laterality: N/A;  . FISTULA SUPERFICIALIZATION Left 04/02/2017   Procedure: FISTULA SUPERFICIALIZATION LEFT ARM;  Surgeon: Serafina Mitchell, MD;  Location: [place];  Service: Vascular;  Laterality: Left;  . FISTULOGRAM Left 04/07/2016   Procedure: FISTULOGRAM;  Surgeon: Waynetta Sandy, MD;  Location: [place];  Service: Vascular;   Laterality: Left;  . FRACTURE SURGERY     right ankle  . GIVENS CAPSULE STUDY N/A 10/29/2017   Procedure: GIVENS CAPSULE STUDY;  Surgeon: Wilford Corner, MD;  Location: [place];  Service: Endoscopy;  Laterality: N/A;  . GIVENS CAPSULE STUDY N/A 12/24/2017   Procedure: GIVENS CAPSULE STUDY;  Surgeon: Irving Copas., MD;  Location: [place];  Service: Gastroenterology;  Laterality: N/A;  . GIVENS CAPSULE STUDY N/A 05/22/2018   Procedure: GIVENS CAPSULE STUDY;  Surgeon: Irving Copas., MD;  Location: [place];  Service: Gastroenterology;  Laterality: N/A;  . INSERTION OF DIALYSIS CATHETER Right 04/07/2016   Procedure: INSERTION OF DIALYSIS CATHETER;  Surgeon: Waynetta Sandy, MD;  Location: [place];  Service: Vascular;  Laterality: Right;  . INSERTION OF DIALYSIS CATHETER Right 04/04/2017   Procedure: INSERTION OF DIALYSIS CATHETER;  Surgeon: Waynetta Sandy, MD;  Location: [place];  Service: Vascular;  Laterality: Right;  . LEFT HEART CATH AND CORONARY ANGIOGRAPHY N/A 05/12/2019   Procedure: LEFT HEART CATH AND CORONARY ANGIOGRAPHY;  Surgeon: Dixie Dials, MD;  Location: [place] CV LAB;  Service: Cardiovascular;  Laterality: N/A;  . LIGATION OF ARTERIOVENOUS  FISTULA Left 04/14/2016   Procedure: LIGATION OF ARTERIOVENOUS  FISTULA;  Surgeon: Angelia Mould, MD;  Location: [place];  Service: Vascular;  Laterality: Left;  . PATCH ANGIOPLASTY Left 06/19/2016   Procedure: PATCH ANGIOPLASTY LEFT ARTERIOVENOUS FISTULA;  Surgeon: Angelia Mould, MD;  Location: [place];  Service: Vascular;  Laterality: Left;  . POLYPECTOMY  09/04/2017   Procedure: POLYPECTOMY;  Surgeon: Ronnette Juniper, MD;  Location: [facility] ENDOSCOPY;  Service: Gastroenterology;;  . REVISON OF ARTERIOVENOUS FISTULA Left 06/19/2016   Procedure: REVISION SUPERFICIALIZATION OF BRACHIOCEPHALIC ARTERIOVENOUS FISTULA;  Surgeon: Angelia Mould, MD;  Location: [place];  Service: Vascular;   Laterality: Left;    Prior to Admission medications   Medication Sig Start Date End Date Taking? Authorizing Provider  allopurinol (ZYLOPRIM) 100 MG tablet Take 100 mg by mouth at bedtime.     [provider]  ALPRAZolam Duanne Moron) 1 MG tablet Take 1 mg by mouth 2 (two) times daily as needed for anxiety.  03/19/17   [provider]  amLODipine (NORVASC) 10 MG tablet Take 1 tablet (10 mg total) by mouth daily. 04/28/19 05/28/19  Elodia Florence., MD  atorvastatin (LIPITOR) 40 MG tablet Take 1 tablet (40 mg total) by mouth daily at 6 PM. 05/13/19   Dixie Dials, MD  calcitRIOL (ROCALTROL) 0.5 MCG capsule Take 1 capsule (0.5 mcg total) by mouth daily with breakfast. 06/04/17   Bonnielee Haff, MD  ferric citrate (AURYXIA) 1 GM 210 MG(Fe) tablet Take 3 tablets (630 mg total) by mouth 3 (three) times daily with meals. 04/28/19   Elodia Florence., MD  gabapentin (NEURONTIN) 100 MG capsule Take 100 mg by mouth daily. 04/24/19   [provider]  hydrALAZINE (APRESOLINE) 25 MG tablet Take 2 tablets (50 mg total) by mouth every 8 (eight) hours. 04/28/19 05/28/19  Elodia Florence., MD  lisinopril (ZESTRIL) 20 MG tablet Take 1 tablet (20 mg total) by mouth daily. 04/28/19 05/28/19  Elodia Florence., MD  metoprolol tartrate (LOPRESSOR) 25 MG tablet Take 2 tablets (50 mg total) by mouth at bedtime. 04/27/19 05/27/19  Elodia Florence., MD  multivitamin (RENA-VIT) TABS tablet Take 1 tablet by mouth at bedtime.     [provider]  omeprazole (PRILOSEC) 40 MG capsule Take 40 mg by mouth 2 (two) times daily.    [provider]  prochlorperazine (COMPAZINE) 10 MG tablet Take 1 tablet (10 mg total) by mouth every 8 (eight) hours as needed for nausea or vomiting. 02/17/18   Mansouraty, Telford Nab., MD  promethazine (PHENERGAN) 25 MG tablet Take 1 tablet (25 mg total) by mouth every 8 (eight) hours as needed for nausea or vomiting. 04/07/18   Shirley, [place], DO     Scheduled Meds:  Infusions: . piperacillin-tazobactam (ZOSYN)  IV     PRN Meds: acetaminophen **OR** acetaminophen, fentaNYL (SUBLIMAZE) injection, labetalol, ondansetron **OR** ondansetron (ZOFRAN) IV   Allergies as of 02/05/2020 - Review Complete 02/05/2020  Allergen Reaction Noted  . Nsaids Other (See Comments) 01/27/2013  . Tolmetin Other (See Comments) 07/20/2015    Family History  Problem Relation Age of Onset  . Hypertension Mother   . Thyroid disease Mother   . Coronary artery disease Father   . Hypertension Father   . Diabetes Father   . Colon cancer Maternal Aunt   . Esophageal  cancer Maternal Uncle   . Inflammatory bowel disease Neg Hx   . Liver disease Neg Hx   . Pancreatic cancer Neg Hx   . Rectal cancer Neg Hx   . Stomach cancer Neg Hx     Social History   Socioeconomic History  . Marital status: Single    Spouse name: Not on file  . Number of children: 3  . Years of education: 35  . Highest education level: Associate degree: academic program  Occupational History  . Occupation: disabled  Tobacco Use  . Smoking status: Never Smoker  . Smokeless tobacco: Never Used  Vaping Use  . Vaping Use: Never used  Substance and Sexual Activity  . Alcohol use: No  . Drug use: No  . Sexual activity: Not Currently    Birth control/protection: Surgical  Other Topics Concern  . Not on file  Social History Narrative  . Not on file   Social Determinants of Health   Financial Resource Strain: Not on file  Food Insecurity: Not on file  Transportation Needs: Not on file  Physical Activity: Not on file  Stress: Not on file  Social Connections: Not on file  Intimate Partner Violence: Not on file   REVIEW OF SYSTEMS: ROS is O/W negative except as mentioned in HPI.   PHYSICAL EXAM: Vital signs in last 24 hours: Vitals:   02/06/20 0501 02/06/20 0740  BP: (!) 170/75 (!) 197/96  Pulse: 94 96  Resp: 10 15  Temp: 99.2 F (37.3 C) 99 F (37.2 C)   SpO2: 94% 95%   Wt Readings from Last 3 Encounters:  02/05/20 103.4 kg  10/03/19 115 kg  07/06/19 107.5 kg   General:  Alert, Well-developed, well-nourished, pleasant and cooperative in NAD Head:  Normocephalic and atraumatic. Eyes:  Sclera clear, no icterus.  Conjunctiva pink. Ears:  Normal auditory acuity. Mouth:  No deformity or lesions.   Lungs:  Clear throughout to auscultation.  No wheezes, crackles, or rhonchi.  Heart:  Regular rate and rhythm; no murmurs, clicks, rubs, or gallops. Abdomen:  Soft, non-distended.  BS present.  LLQ TTP.  Msk:  Symmetrical without gross deformities. Pulses:  Normal pulses noted. Extremities:  Without clubbing or edema. Neurologic:  Alert and oriented x 4;  grossly normal neurologically. Skin:  Intact without significant lesions or rashes. Psych:  Alert and cooperative. Normal mood and affect.  Intake/Output from previous day: 01/31 0701 - 02/01 0700 In: 50 [IV Piggyback:50] Out: -   LAB RESULTS: Recent Labs    02/05/20 1850 02/06/20 0120  WBC 14.5*  --   HGB 6.9* 6.5*  HCT 23.8* 21.2*  PLT 277  --    BMET Lab Results  Component Value Date   NA 133 (L) 02/05/2020   NA 138 05/14/2019   NA 136 05/13/2019   K 3.4 (L) 02/05/2020   K 4.6 05/14/2019   K 5.3 (H) 05/13/2019   CL 91 (L) 02/05/2020   CL 104 05/14/2019   CL 98 05/13/2019   CO2 26 02/05/2020   CO2 22 05/14/2019   CO2 24 05/13/2019   GLUCOSE 95 02/05/2020   GLUCOSE 116 (H) 05/14/2019   GLUCOSE 96 05/13/2019   BUN 19 02/05/2020   BUN 58 (H) 05/14/2019   BUN 28 (H) 05/13/2019   CREATININE 7.99 (H) 02/05/2020   CREATININE 11.49 (H) 05/14/2019   CREATININE 6.58 (H) 05/13/2019   CALCIUM 9.3 02/05/2020   CALCIUM 9.1 05/14/2019   CALCIUM 9.0 05/13/2019   LFT  Recent Labs    02/05/20 1850  PROT 7.2  ALBUMIN 2.8*  AST 18  ALT 13  ALKPHOS 59  BILITOT 0.7   PT/INR Lab Results  Component Value Date   INR 1.1 05/11/2019   INR 1.07 02/26/2018   INR 0.98  12/22/2017   Lipase     Component Value Date/Time   LIPASE 34 05/11/2019 0515    Drugs of Abuse     Component Value Date/Time   LABOPIA NONE DETECTED 01/19/2015 0145   COCAINSCRNUR NONE DETECTED 01/19/2015 0145   LABBENZ NONE DETECTED 01/19/2015 0145   AMPHETMU NONE DETECTED 01/19/2015 0145   THCU NONE DETECTED 01/19/2015 0145   LABBARB NONE DETECTED 01/19/2015 0145     RADIOLOGY STUDIES: CT Abdomen Pelvis W Contrast  Result Date: 02/06/2020 CLINICAL DATA:  Rectal bleeding and abdominal pain EXAM: CT ABDOMEN AND PELVIS WITH CONTRAST TECHNIQUE: Multidetector CT imaging of the abdomen and pelvis was performed using the standard protocol following bolus administration of intravenous contrast. CONTRAST:  117mL OMNIPAQUE IOHEXOL 300 MG/ML  SOLN COMPARISON:  04/04/2018 FINDINGS: Lower chest: No acute abnormality. Hepatobiliary: No focal liver abnormality is seen. No gallstones, gallbladder wall thickening, or biliary dilatation. Pancreas: Unremarkable. No pancreatic ductal dilatation or surrounding inflammatory changes. Spleen: Normal in size without focal abnormality. Adrenals/Urinary Tract: Adrenal glands are within normal limits bilaterally. Kidneys demonstrate a normal enhancement pattern with small cysts bilaterally. No renal calculi or obstructive changes are seen. The bladder is decompressed. Stomach/Bowel: Diverticular change of the colon is noted. Along the posterior wall of the sigmoid colon interposed between the sigmoid and the rectum there is a 3.3 x 2.3 cm fluid collection which was not present on the prior exam and likely represents a small pericolonic abscess. No other inflammatory changes are seen. The appendix is prominent (13 mm) with some inspissated barium within although no inflammatory changes are seen. Small bowel and stomach appear within normal limits. Vascular/Lymphatic: Aortic atherosclerosis. No enlarged abdominal or pelvic lymph nodes. Reproductive: Status post  hysterectomy. No adnexal masses. Other: Peritoneal dialysis catheter is noted. No significant residual dialysate is seen. Small fat containing anterior abdominal wall hernias are noted stable from the prior study. No herniation of bowel loops is seen. Musculoskeletal: No acute or significant osseous findings. IMPRESSION: Changes of diverticulosis with a fluid collection posterior to the sigmoid and anterior to the rectum as described suspicious for small pericolonic abscess likely arising from the posterior wall of the sigmoid colon. Prominent appendix with inspissated barium although no inflammatory changes are seen. Stable chronic changes similar to that seen on previous exams. Electronically Signed   By: Inez Catalina M.D.   On: 02/06/2020 02:28   ENDOSCOPIC STUDIES: VCE 05/2018.  Nonbleeding small bowel AVMs beyond reach of conventional push enteroscopy.  Plan was to refer for double-balloon enteroscopy at [facility] Esophageal pathology mild reactive changes, no increased intraepithelial eosinophils.  Benign squamous mucosa.  No metaplasia, dysplasia, malignancy. EGD Feb 2020 -cm hiatal hernia, o/w unremarkable.    Dec 24, 2017  Endocapsule Study- Incomplete study. Capsule remained in small bowel 13 hours , 51 min. Two non-bleeding AVMS between 2 hours 34 min and 3 hours 9 minutes.  December 2019 SBE - non-bleeding clean based gastric ulcer and erosive gastropathy, duodenopathy. VCE placed.  Oct 2019 Endocapsule study [facility]) showed nodularity in distal small bowel.  August 2019 colonoscopy - complete exam with adequate prep.  -congested and erythematous mucosa in the sigmoid colon, in the transverse colon and in  the ascending colon. Mucosal friability noted in ascending colon and cecum, no active bleeding. -Diverticulosis in the sigmoid colon and in the descending colon. -The distal rectum and anal verge are normal on retroflexion view. -Two 3 to 8 mm polyps in the transverse colon, removed piecemeal  using a cold biopsyforceps. Resected and retrieved.  Path: Sessile serrated adenomas.    IMPRESSION:   *   Sigmoid diverticulitis with pericolonic abscess between the rectum and sigmoid colon.   Rectal bleeding. Multiple previous endoscopic/colonoscopic evaluations, VCE with findings of non-bleeding distal small bowel AVMs, nonbleeding gastric ulcers, erosive gastritis, duodenitis, colonic diverticulosis, colon polyps. Surgery has seen the patient.  Abscess not amenable to drainage.  *    Acute on chronic anemia, acute likely due to GI blood loss:  Received one unit PRBCs for Hgb of 6.5 grams and that has come up to 7.3 grams (baseline between 7.5-8.5 grams).  Takes ferric citrate TID at home.  *    ESRD on peritoneal dialysis   PLAN:     - Agree with IV Zosyn, bowel rest, IV hydration, symptomatic management.  Likely can have clear liquids later today if patient feels up to it. -Monitor Hgb and transfuse further prn.   Alonza Bogus  02/06/2020, 8:46 AM

## 2020-02-06 NOTE — H&P (Signed)
History and Physical    Patricia [name] ZOX:[id number] DOB: 07/10/1975 DOA: 02/05/2020  PCP: Doyle Askew, PA-C   Patient coming from: Home   Chief Complaint: Abdominal pain, rectal bleeding   HPI: Patricia [name] is a 45 y.o. female with medical history significant for ESRD on hemodialysis MWF, hypertension, anxiety, and history of GI bleeding, now presenting to the emergency department for evaluation of abdominal pain and rectal bleeding.  Patient reports 1 day of pain in the lower abdomen, fatigue, exertional dyspnea, and rectal bleeding.  She describes having an urge to defecate and passing bright red blood and clots.  She has had several episodes of this bleeding.  The pain has been constant for the past day, was worse after she ate something, and so she has not eaten anything since yesterday.  She denies similar symptoms previously and notes that she had dark stool with her previous GI bleeds.    She had EGD in 2019 which demonstrated healing of a prior gastric ulcer, and no gross lesions in the esophagus, stomach, duodenum, or proximal jejunum.  Colonoscopy in 2020 revealed congested and erythematous mucosa in the sigmoid and transverse colon diverticulosis involving the sigmoid and descending colon, and polyps in the transverse colon.  Capsule endoscopy in May 2020 revealed nonbleeding small bowel AVMs beyond the reach of conventional push enteroscopy.  ED Course: Upon arrival to the ED, patient is found to be febrile to 38.3 C, saturating well on room air, and hypertensive to 190/90.  Chemistry panel is notable for sodium 133, potassium 3.4, normal bicarbonate, and normal BUN.  CBC features a leukocytosis to 14,500 and normocytic anemia with hemoglobin 6.9, down from 8.2 in September.  Covid antigen test was negative and fecal occult blood test positive.  Surgery was consulted by the ED physician and the patient was treated with acetaminophen, Xanax, fentanyl, Zofran,  and Zosyn.  Covid PCR is pending.  Repeat hemoglobin is 6.5.  Type and screen was performed.  Review of Systems:  All other systems reviewed and apart from HPI, are negative.  Past Medical History:  Diagnosis Date  . Acid reflux   . Anemia 04/2019   REQUIRING BLOOD TRANSFUSIONS  . Anxiety   . Arthritis   . Dyspnea   . Dysrhythmia    tachycardia  . ESRD (end stage renal disease) ([place])    [facility]- [place]  . GI bleeding 12/2017  . Gout   . Headache(784.0)    "related to chemo; sometimes weekly" (09/12/2013)  . High cholesterol   . History of blood transfusion "a couple"   "related to low counts"  . Hypertension   . Iron deficiency anemia    "get epogen shots q month" (02/20/2014)  . 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 Peritoneal dialysis status ([place])   . Stroke [facility]) 08/15/2017    Past Surgical History:  Procedure Laterality Date  . ABDOMINAL HYSTERECTOMY  2010   "laparoscopic"  . ANKLE FRACTURE SURGERY Right 1994  . AV FISTULA PLACEMENT Left 04/14/2016   Procedure: ARTERIOVENOUS (AV) FISTULA CREATION;  Surgeon: Angelia Mould, MD;  Location: [facility] OR;  Service: Vascular;  Laterality: Left;  . AV FISTULA PLACEMENT Left 08/09/2017   Procedure: INSERTION OF ARTERIOVENOUS (AV) GORE-TEX GRAFT LEFT ARM;  Surgeon: Elam Dutch, MD;  Location: [place];  Service: Vascular;  Laterality: Left;  . [place] REMOVAL Left 08/11/2017   Procedure: REMOVAL OF  ARTERIOVENOUS GORETEX GRAFT ([place]);  Surgeon: Serafina Mitchell, MD;  Location: [facility] OR;  Service: Vascular;  Laterality: Left;  . BIOPSY  09/03/2017   Procedure: BIOPSY;  Surgeon: Otis Brace, MD;  Location: [place];  Service: Gastroenterology;;  . BIOPSY  12/24/2017   Procedure: BIOPSY;  Surgeon: Irving Copas., MD;  Location: [place];  Service: Gastroenterology;;  . CARDIAC CATHETERIZATION  2000's  . COLONOSCOPY WITH PROPOFOL N/A 09/04/2017    Procedure: COLONOSCOPY WITH PROPOFOL;  Surgeon: Ronnette Juniper, MD;  Location: [place];  Service: Gastroenterology;  Laterality: N/A;  . ENTEROSCOPY N/A 12/24/2017   Procedure: ENTEROSCOPY;  Surgeon: Rush Landmark Telford Nab., MD;  Location: [place];  Service: Gastroenterology;  Laterality: N/A;  . ESOPHAGOGASTRODUODENOSCOPY    . ESOPHAGOGASTRODUODENOSCOPY (EGD) WITH PROPOFOL Left 06/04/2017   Procedure: ESOPHAGOGASTRODUODENOSCOPY (EGD) WITH PROPOFOL;  Surgeon: Arta Silence, MD;  Location: [place];  Service: Endoscopy;  Laterality: Left;  . ESOPHAGOGASTRODUODENOSCOPY (EGD) WITH PROPOFOL N/A 09/03/2017   Procedure: ESOPHAGOGASTRODUODENOSCOPY (EGD) WITH PROPOFOL;  Surgeon: Otis Brace, MD;  Location: [place];  Service: Gastroenterology;  Laterality: N/A;  . FISTULA SUPERFICIALIZATION Left 04/02/2017   Procedure: FISTULA SUPERFICIALIZATION LEFT ARM;  Surgeon: Serafina Mitchell, MD;  Location: [place];  Service: Vascular;  Laterality: Left;  . FISTULOGRAM Left 04/07/2016   Procedure: FISTULOGRAM;  Surgeon: Waynetta Sandy, MD;  Location: [place];  Service: Vascular;  Laterality: Left;  . FRACTURE SURGERY     right ankle  . GIVENS CAPSULE STUDY N/A 10/29/2017   Procedure: GIVENS CAPSULE STUDY;  Surgeon: Wilford Corner, MD;  Location: [place];  Service: Endoscopy;  Laterality: N/A;  . GIVENS CAPSULE STUDY N/A 12/24/2017   Procedure: GIVENS CAPSULE STUDY;  Surgeon: Irving Copas., MD;  Location: [place];  Service: Gastroenterology;  Laterality: N/A;  . GIVENS CAPSULE STUDY N/A 05/22/2018   Procedure: GIVENS CAPSULE STUDY;  Surgeon: Irving Copas., MD;  Location: [place];  Service: Gastroenterology;  Laterality: N/A;  . INSERTION OF DIALYSIS CATHETER Right 04/07/2016   Procedure: INSERTION OF DIALYSIS CATHETER;  Surgeon: Waynetta Sandy, MD;  Location: [place];  Service: Vascular;  Laterality: Right;  . INSERTION OF DIALYSIS CATHETER Right  04/04/2017   Procedure: INSERTION OF DIALYSIS CATHETER;  Surgeon: Waynetta Sandy, MD;  Location: [place];  Service: Vascular;  Laterality: Right;  . LEFT HEART CATH AND CORONARY ANGIOGRAPHY N/A 05/12/2019   Procedure: LEFT HEART CATH AND CORONARY ANGIOGRAPHY;  Surgeon: Dixie Dials, MD;  Location: [place] [facility] LAB;  Service: Cardiovascular;  Laterality: N/A;  . LIGATION OF ARTERIOVENOUS  FISTULA Left 04/14/2016   Procedure: LIGATION OF ARTERIOVENOUS  FISTULA;  Surgeon: Angelia Mould, MD;  Location: [place];  Service: Vascular;  Laterality: Left;  . PATCH ANGIOPLASTY Left 06/19/2016   Procedure: PATCH ANGIOPLASTY LEFT ARTERIOVENOUS FISTULA;  Surgeon: Angelia Mould, MD;  Location: [place];  Service: Vascular;  Laterality: Left;  . POLYPECTOMY  09/04/2017   Procedure: POLYPECTOMY;  Surgeon: Ronnette Juniper, MD;  Location: [facility] ENDOSCOPY;  Service: Gastroenterology;;  . REVISON OF ARTERIOVENOUS FISTULA Left 06/19/2016   Procedure: REVISION SUPERFICIALIZATION OF BRACHIOCEPHALIC ARTERIOVENOUS FISTULA;  Surgeon: Angelia Mould, MD;  Location: [place];  Service: Vascular;  Laterality: Left;    Social History:   reports that she has never smoked. She has never used smokeless tobacco. She reports that she does not drink alcohol and does not use drugs.  Allergies  Allergen Reactions  . Nsaids Other (See Comments)  Cannot take due to Kidney disease/Kidney function   . Tolmetin Other (See Comments)    Cannot take due to Kidney Disease    Family History  Problem Relation Age of Onset  . Hypertension Mother   . Thyroid disease Mother   . Coronary artery disease Father   . Hypertension Father   . Diabetes Father   . Colon cancer Maternal Aunt   . Esophageal cancer Maternal Uncle   . Inflammatory bowel disease Neg Hx   . Liver disease Neg Hx   . Pancreatic cancer Neg Hx   . Rectal cancer Neg Hx   . Stomach cancer Neg Hx      Prior to Admission medications   Medication  Sig Start Date End Date Taking? Authorizing Provider  allopurinol (ZYLOPRIM) 100 MG tablet Take 100 mg by mouth at bedtime.     [provider]  ALPRAZolam Duanne Moron) 1 MG tablet Take 1 mg by mouth 2 (two) times daily as needed for anxiety.  03/19/17   [provider]  amLODipine (NORVASC) 10 MG tablet Take 1 tablet (10 mg total) by mouth daily. 04/28/19 05/28/19  Elodia Florence., MD  atorvastatin (LIPITOR) 40 MG tablet Take 1 tablet (40 mg total) by mouth daily at 6 PM. 05/13/19   Dixie Dials, MD  calcitRIOL (ROCALTROL) 0.5 MCG capsule Take 1 capsule (0.5 mcg total) by mouth daily with breakfast. 06/04/17   Bonnielee Haff, MD  ferric citrate (AURYXIA) 1 GM 210 MG(Fe) tablet Take 3 tablets (630 mg total) by mouth 3 (three) times daily with meals. 04/28/19   Elodia Florence., MD  gabapentin (NEURONTIN) 100 MG capsule Take 100 mg by mouth daily. 04/24/19   [provider]  hydrALAZINE (APRESOLINE) 25 MG tablet Take 2 tablets (50 mg total) by mouth every 8 (eight) hours. 04/28/19 05/28/19  Elodia Florence., MD  lisinopril (ZESTRIL) 20 MG tablet Take 1 tablet (20 mg total) by mouth daily. 04/28/19 05/28/19  Elodia Florence., MD  metoprolol tartrate (LOPRESSOR) 25 MG tablet Take 2 tablets (50 mg total) by mouth at bedtime. 04/27/19 05/27/19  Elodia Florence., MD  multivitamin (RENA-VIT) TABS tablet Take 1 tablet by mouth at bedtime.     [provider]  omeprazole (PRILOSEC) 40 MG capsule Take 40 mg by mouth 2 (two) times daily.    [provider]  prochlorperazine (COMPAZINE) 10 MG tablet Take 1 tablet (10 mg total) by mouth every 8 (eight) hours as needed for nausea or vomiting. 02/17/18   Mansouraty, Telford Nab., MD  promethazine (PHENERGAN) 25 MG tablet Take 1 tablet (25 mg total) by mouth every 8 (eight) hours as needed for nausea or vomiting. 04/07/18   Shirley, [place], DO    Physical Exam: Vitals:   02/06/20 0045 02/06/20 0135 02/06/20  0200 02/06/20 0245  BP: (!) 147/79 (!) 164/97 (!) 194/90 (!) 188/89  Pulse: 83 84 88 87  Resp: $Remo'20 18 19 18  'yeysS$ Temp:  98.2 F (36.8 C)    TempSrc:  Oral    SpO2: 100% 98% 100% 100%  Weight:      Height:        Constitutional: NAD, calm  Eyes: PERTLA, lids and conjunctivae normal ENMT: Mucous membranes are moist. Posterior pharynx clear of any exudate or lesions.   Neck: normal, supple, no masses, no thyromegaly Respiratory: clear to auscultation bilaterally, no wheezing, no crackles. No accessory muscle use.  Cardiovascular: S1 & S2 heard, regular rate  and rhythm. No extremity edema.   Abdomen: No distension, soft, no rebound pain or guarding. Bowel sounds active.  Musculoskeletal: no clubbing / cyanosis. No joint deformity upper and lower extremities.   Skin: no significant rashes, lesions, ulcers. Warm, dry, well-perfused. Neurologic: CN 2-12 grossly intact. Blinking tic. Sensation intact. Moving all extremities.  Psychiatric: Alert and oriented to person, place, and situation. Very pleasant and cooperative.    Labs and Imaging on Admission: I have personally reviewed following labs and imaging studies  CBC: Recent Labs  Lab 02/05/20 1850 02/06/20 0120  WBC 14.5*  --   HGB 6.9* 6.5*  HCT 23.8* 21.2*  MCV 93.3  --   PLT 277  --    Basic Metabolic Panel: Recent Labs  Lab 02/05/20 1850  NA 133*  K 3.4*  CL 91*  CO2 26  GLUCOSE 95  BUN 19  CREATININE 7.99*  CALCIUM 9.3   GFR: Estimated Creatinine Clearance: 10.9 mL/min (A) (by C-G formula based on SCr of 7.99 mg/dL (H)). Liver Function Tests: Recent Labs  Lab 02/05/20 1850  AST 18  ALT 13  ALKPHOS 59  BILITOT 0.7  PROT 7.2  ALBUMIN 2.8*   No results for input(s): LIPASE, AMYLASE in the last 168 hours. No results for input(s): AMMONIA in the last 168 hours. Coagulation Profile: No results for input(s): INR, PROTIME in the last 168 hours. Cardiac Enzymes: No results for input(s): CKTOTAL, CKMB, CKMBINDEX,  TROPONINI in the last 168 hours. BNP (last 3 results) No results for input(s): PROBNP in the last 8760 hours. HbA1C: No results for input(s): HGBA1C in the last 72 hours. CBG: No results for input(s): GLUCAP in the last 168 hours. Lipid Profile: No results for input(s): CHOL, HDL, LDLCALC, TRIG, CHOLHDL, LDLDIRECT in the last 72 hours. Thyroid Function Tests: No results for input(s): TSH, T4TOTAL, FREET4, T3FREE, THYROIDAB in the last 72 hours. Anemia Panel: No results for input(s): VITAMINB12, FOLATE, FERRITIN, TIBC, IRON, RETICCTPCT in the last 72 hours. Urine analysis:    Component Value Date/Time   COLORURINE COLORLESS (A) 04/03/2018 2319   APPEARANCEUR CLEAR 04/03/2018 2319   LABSPEC 1.006 04/03/2018 2319   PHURINE 8.0 04/03/2018 2319   GLUCOSEU >=500 (A) 04/03/2018 2319   HGBUR NEGATIVE 04/03/2018 2319   BILIRUBINUR NEGATIVE 04/03/2018 2319   KETONESUR NEGATIVE 04/03/2018 2319   PROTEINUR 100 (A) 04/03/2018 2319   UROBILINOGEN 0.2 09/19/2014 1620   NITRITE NEGATIVE 04/03/2018 2319   LEUKOCYTESUR NEGATIVE 04/03/2018 2319   Sepsis Labs: $[id number]$ (procalcitonin:4,lacticidven:4) ) Recent Results (from the past 240 hour(s))  SARS Coronavirus 2 by RT PCR (hospital order, performed in [place] hospital lab) Nasopharyngeal Nasopharyngeal Swab     Status: None   Collection Time: 02/06/20  1:29 AM   Specimen: Nasopharyngeal Swab  Result Value Ref Range Status   SARS Coronavirus 2 NEGATIVE NEGATIVE Final    Comment: (NOTE) SARS-CoV-2 target nucleic acids are NOT DETECTED.  The SARS-CoV-2 RNA is generally detectable in upper and lower respiratory specimens during the acute phase of infection. The lowest concentration of SARS-CoV-2 viral copies this assay can detect is 250 copies / mL. A negative result does not preclude SARS-CoV-2 infection and should not be used as the sole basis for treatment or other patient management decisions.  A negative result may occur  with improper specimen collection / handling, submission of specimen other than nasopharyngeal swab, presence of viral mutation(s) within the areas targeted by this assay, and inadequate number of viral copies (<250 copies /  mL). A negative result must be combined with clinical observations, patient history, and epidemiological information.  Fact Sheet for Patients:   StrictlyIdeas.no  Fact Sheet for Healthcare Providers: BankingDealers.co.za  This test is not yet approved or  cleared by the [place] FDA and has been authorized for detection and/or diagnosis of SARS-CoV-2 by FDA under an Emergency Use Authorization (EUA).  This EUA will remain in effect (meaning this test can be used) for the duration of the COVID-19 declaration under Section 564(b)(1) of the Act, 21 U.S.C. section 360bbb-3(b)(1), unless the authorization is terminated or revoked sooner.  Performed at [facility], [place] [address] [place], [place] [address]      Radiological Exams on Admission: CT Abdomen Pelvis W Contrast  Result Date: 02/06/2020 CLINICAL DATA:  Rectal bleeding and abdominal pain EXAM: CT ABDOMEN AND PELVIS WITH CONTRAST TECHNIQUE: Multidetector CT imaging of the abdomen and pelvis was performed using the standard protocol following bolus administration of intravenous contrast. CONTRAST:  168mL OMNIPAQUE IOHEXOL 300 MG/ML  SOLN COMPARISON:  04/04/2018 FINDINGS: Lower chest: No acute abnormality. Hepatobiliary: No focal liver abnormality is seen. No gallstones, gallbladder wall thickening, or biliary dilatation. Pancreas: Unremarkable. No pancreatic ductal dilatation or surrounding inflammatory changes. Spleen: Normal in size without focal abnormality. Adrenals/Urinary Tract: Adrenal glands are within normal limits bilaterally. Kidneys demonstrate a normal enhancement pattern with small cysts bilaterally. No renal calculi or obstructive changes are  seen. The bladder is decompressed. Stomach/Bowel: Diverticular change of the colon is noted. Along the posterior wall of the sigmoid colon interposed between the sigmoid and the rectum there is a 3.3 x 2.3 cm fluid collection which was not present on the prior exam and likely represents a small pericolonic abscess. No other inflammatory changes are seen. The appendix is prominent (13 mm) with some inspissated barium within although no inflammatory changes are seen. Small bowel and stomach appear within normal limits. Vascular/Lymphatic: Aortic atherosclerosis. No enlarged abdominal or pelvic lymph nodes. Reproductive: Status post hysterectomy. No adnexal masses. Other: Peritoneal dialysis catheter is noted. No significant residual dialysate is seen. Small fat containing anterior abdominal wall hernias are noted stable from the prior study. No herniation of bowel loops is seen. Musculoskeletal: No acute or significant osseous findings. IMPRESSION: Changes of diverticulosis with a fluid collection posterior to the sigmoid and anterior to the rectum as described suspicious for small pericolonic abscess likely arising from the posterior wall of the sigmoid colon. Prominent appendix with inspissated barium although no inflammatory changes are seen. Stable chronic changes similar to that seen on previous exams. Electronically Signed   By: Inez Catalina M.D.   On: 02/06/2020 02:28    Assessment/Plan   1. Pericolonic abscess, sepsis  - Presents with 1 day of abdominal pain and GI bleeding and is found to have 3.3 x 2.3 cm pericolonic abscess on CT  - She meets sepsis criteria with fever and WBC 14,500 but is not tachycardic or hypotensive  - Surgery consulting and much appreciated  - Continue bowel rest and Zosyn    2. Lower GI bleeding; symptomataic anemia  - Presents with 1 day abdominal pain and GI bleeding, reports fatigue and DOE, and is found to have Hgb of 6.9, down from 8.2 in September  - She has hx of  gastric ulcer that had healed on EGD from 2019, small bowel AVMs, and diverticulosis - She has hx of GIB but reports melena in those instances and now urge to defecate followed by BRBPR and  clots  - BUN is normal, likely diverticular bleed, no overt bleeding in ED  - Transfuse 1 unit RBC, monitor CBC, plan to consult GI in am    3. ESRD  - She reports completing HD without incident on 02/05/20  - No indication for urgent HD on admission  - Restrict fluids, renally-dose medications, monitor    4. Hypertension  - Labetalol as needed for now     DVT prophylaxis: SCDs Code Status: Full  Level of Care: Level of care: Telemetry Medical Family Communication: Discussed with patient  Disposition Plan:  Patient is from: Home  Anticipated d/c is to: Home  Anticipated d/c date is: 02/10/20  Patient currently: Pending blood transfusion, treatment of abscess, likely GI consultation, will likely need inpatient HD  Consults called: Surgery  Admission status: Inpatient     Vianne Bulls, MD [facility]  02/06/2020, 4:09 AM

## 2020-02-06 NOTE — ED Notes (Signed)
Patient is getting united of blood at this time

## 2020-02-06 NOTE — ED Provider Notes (Signed)
[facility] EMERGENCY DEPARTMENT Provider Note   CSN: [id number] Arrival date & time: 02/05/20  1826     History Chief Complaint  Patient presents with  . Shortness of Breath  . GI Bleeding  . Fever    Patricia [name] is a 45 y.o. female w PMHx ESRD on HD MWF, HTN, CVA, HTN, GI bleed, diverticulitis, presenting to the ED with complaint of rectal bleeding that began this morning.  Patient states she had a bowel movement and was passing large bright red blood clots.  She states she has had about 5 bloody bowel movements throughout the day.  At one point she had dark appearing stool with bright red blood in the toilet.  She is also having abdominal pain across the lower abdomen that is sharp in nature.  She endorses history of diverticulitis and pain does feel the same.  She states she has had an episode of GI bleeding in the past though never had gross blood in her stool before.  She completed a full treatment of dialysis today.  She is feeling more weak and short of breath earlier today.  Not on anticoagulation.  The history is provided by the patient.       Past Medical History:  Diagnosis Date  . Acid reflux   . Anemia 04/2019   REQUIRING BLOOD TRANSFUSIONS  . Anxiety   . Arthritis   . Dyspnea   . Dysrhythmia    tachycardia  . ESRD (end stage renal disease) ([place])    [place]THSAT- [place]  . GI bleeding 12/2017  . Gout   . Headache(784.0)    "related to chemo; sometimes weekly" (09/12/2013)  . High cholesterol   . History of blood transfusion "a couple"   "related to low counts"  . Hypertension   . Iron deficiency anemia    "get epogen shots q month" (02/20/2014)  . 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 Peritoneal dialysis status ([place])   . Stroke [facility]) 08/15/2017    Patient Active Problem List   Diagnosis Date Noted  . Pericolonic abscess 02/06/2020  . Chest pain 05/10/2019  .  Anemia 04/25/2019  . SVT (supraventricular tachycardia) ([place]) 04/25/2019  . Elevated troponin 04/25/2019  . ESRD on hemodialysis ([place]) 04/25/2019  . Acute on chronic blood loss anemia 05/21/2018  . GI bleed 05/20/2018  . Symptomatic anemia 10/28/2017  . ESRD on peritoneal dialysis ([place]) 04/01/2016  . Anemia due to chronic kidney disease 04/01/2016  . GERD (gastroesophageal reflux disease) 04/01/2016  . Nephrotic syndrome 10/02/2013  . Dyslipidemia 10/02/2013  . PAT (paroxysmal atrial tachycardia) ([place]) 09/29/2013  . Normocytic anemia 06/29/2013  . Glomerulonephritis 01/28/2013  . Morbid obesity ([place]) 07/04/2011  . Hypertension 07/04/2011  . Hypercholesterolemia 07/04/2011    Past Surgical History:  Procedure Laterality Date  . ABDOMINAL HYSTERECTOMY  2010   "laparoscopic"  . ANKLE FRACTURE SURGERY Right 1994  . AV FISTULA PLACEMENT Left 04/14/2016   Procedure: ARTERIOVENOUS (AV) FISTULA CREATION;  Surgeon: Angelia Mould, MD;  Location: [facility] OR;  Service: Vascular;  Laterality: Left;  . AV FISTULA PLACEMENT Left 08/09/2017   Procedure: INSERTION OF ARTERIOVENOUS (AV) GORE-TEX GRAFT LEFT ARM;  Surgeon: Elam Dutch, MD;  Location: [facility];  Service: Vascular;  Laterality: Left;  . [place] REMOVAL Left 08/11/2017   Procedure: REMOVAL OF ARTERIOVENOUS GORETEX GRAFT ([place]);  Surgeon: Serafina Mitchell, MD;  Location: [facility];  Service: Vascular;  Laterality: Left;  . BIOPSY  09/03/2017   Procedure: BIOPSY;  Surgeon: Kathi Der, MD;  Location: [facility] ENDOSCOPY;  Service: Gastroenterology;;  . BIOPSY  12/24/2017   Procedure: BIOPSY;  Surgeon: Lemar Lofty., MD;  Location: [facility] ENDOSCOPY;  Service: Gastroenterology;;  . CARDIAC CATHETERIZATION  2000's  . COLONOSCOPY WITH PROPOFOL N/A 09/04/2017   Procedure: COLONOSCOPY WITH PROPOFOL;  Surgeon: Kerin Salen, MD;  Location: [facility] ENDOSCOPY;  Service: Gastroenterology;  Laterality: N/A;  . ENTEROSCOPY N/A 12/24/2017   Procedure:  ENTEROSCOPY;  Surgeon: Meridee Score Netty Starring., MD;  Location: [facility] ENDOSCOPY;  Service: Gastroenterology;  Laterality: N/A;  . ESOPHAGOGASTRODUODENOSCOPY    . ESOPHAGOGASTRODUODENOSCOPY (EGD) WITH PROPOFOL Left 06/04/2017   Procedure: ESOPHAGOGASTRODUODENOSCOPY (EGD) WITH PROPOFOL;  Surgeon: Willis Modena, MD;  Location: [facility] ENDOSCOPY;  Service: Endoscopy;  Laterality: Left;  . ESOPHAGOGASTRODUODENOSCOPY (EGD) WITH PROPOFOL N/A 09/03/2017   Procedure: ESOPHAGOGASTRODUODENOSCOPY (EGD) WITH PROPOFOL;  Surgeon: Kathi Der, MD;  Location: [facility] ENDOSCOPY;  Service: Gastroenterology;  Laterality: N/A;  . FISTULA SUPERFICIALIZATION Left 04/02/2017   Procedure: FISTULA SUPERFICIALIZATION LEFT ARM;  Surgeon: Nada Libman, MD;  Location: [facility] OR;  Service: Vascular;  Laterality: Left;  . FISTULOGRAM Left 04/07/2016   Procedure: FISTULOGRAM;  Surgeon: Maeola Harman, MD;  Location: [facility] OR;  Service: Vascular;  Laterality: Left;  . FRACTURE SURGERY     right ankle  . GIVENS CAPSULE STUDY N/A 10/29/2017   Procedure: GIVENS CAPSULE STUDY;  Surgeon: Charlott Rakes, MD;  Location: [facility] ENDOSCOPY;  Service: Endoscopy;  Laterality: N/A;  . GIVENS CAPSULE STUDY N/A 12/24/2017   Procedure: GIVENS CAPSULE STUDY;  Surgeon: Lemar Lofty., MD;  Location: [facility] ENDOSCOPY;  Service: Gastroenterology;  Laterality: N/A;  . GIVENS CAPSULE STUDY N/A 05/22/2018   Procedure: GIVENS CAPSULE STUDY;  Surgeon: Lemar Lofty., MD;  Location: [facility] ENDOSCOPY;  Service: Gastroenterology;  Laterality: N/A;  . INSERTION OF DIALYSIS CATHETER Right 04/07/2016   Procedure: INSERTION OF DIALYSIS CATHETER;  Surgeon: Maeola Harman, MD;  Location: [facility] OR;  Service: Vascular;  Laterality: Right;  . INSERTION OF DIALYSIS CATHETER Right 04/04/2017   Procedure: INSERTION OF DIALYSIS CATHETER;  Surgeon: Maeola Harman, MD;  Location: [facility] OR;  Service: Vascular;  Laterality: Right;  . LEFT HEART CATH AND  CORONARY ANGIOGRAPHY N/A 05/12/2019   Procedure: LEFT HEART CATH AND CORONARY ANGIOGRAPHY;  Surgeon: Orpah Cobb, MD;  [facility] INVASIVE CV LAB;  Service: Cardiovascular;  Laterality: N/A;  . LIGATION OF ARTERIOVENOUS  FISTULA Left 04/14/2016   Procedure: LIGATION OF ARTERIOVENOUS  FISTULA;  Surgeon: Chuck Hint, MD;  Location: [facility] OR;  Service: Vascular;  Laterality: Left;  . PATCH ANGIOPLASTY Left 06/19/2016   Procedure: PATCH ANGIOPLASTY LEFT ARTERIOVENOUS FISTULA;  Surgeon: Chuck Hint, MD;  Location: [facility] OR;  Service: Vascular;  Laterality: Left;  . POLYPECTOMY  09/04/2017   Procedure: POLYPECTOMY;  Surgeon: Kerin Salen, MD;  Location: [facility] ENDOSCOPY;  Service: Gastroenterology;;  . REVISON OF ARTERIOVENOUS FISTULA Left 06/19/2016   Procedure: REVISION SUPERFICIALIZATION OF BRACHIOCEPHALIC ARTERIOVENOUS FISTULA;  Surgeon: Chuck Hint, MD;  Location: [facility] OR;  Service: Vascular;  Laterality: Left;     OB History    Gravida  3   Para      Term      Preterm      AB      Living        SAB      IAB      Ectopic  Multiple  3   Live Births              Family History  Problem Relation Age of Onset  . Hypertension Mother   . Thyroid disease Mother   . Coronary artery disease Father   . Hypertension Father   . Diabetes Father   . Colon cancer Maternal Aunt   . Esophageal cancer Maternal Uncle   . Inflammatory bowel disease Neg Hx   . Liver disease Neg Hx   . Pancreatic cancer Neg Hx   . Rectal cancer Neg Hx   . Stomach cancer Neg Hx     Social History   Tobacco Use  . Smoking status: Never Smoker  . Smokeless tobacco: Never Used  Vaping Use  . Vaping Use: Never used  Substance Use Topics  . Alcohol use: No  . Drug use: No    Home Medications Prior to Admission medications   Medication Sig Start Date End Date Taking? Authorizing Provider  allopurinol (ZYLOPRIM) 100 MG tablet Take 100 mg by mouth at bedtime.     [provider]  ALPRAZolam Duanne Moron) 1 MG tablet Take 1 mg by mouth 2 (two) times daily as needed for anxiety.  03/19/17   [provider]  amLODipine (NORVASC) 10 MG tablet Take 1 tablet (10 mg total) by mouth daily. 04/28/19 05/28/19  Elodia Florence., MD  atorvastatin (LIPITOR) 40 MG tablet Take 1 tablet (40 mg total) by mouth daily at 6 PM. 05/13/19   Dixie Dials, MD  calcitRIOL (ROCALTROL) 0.5 MCG capsule Take 1 capsule (0.5 mcg total) by mouth daily with breakfast. 06/04/17   Bonnielee Haff, MD  ferric citrate (AURYXIA) 1 GM 210 MG(Fe) tablet Take 3 tablets (630 mg total) by mouth 3 (three) times daily with meals. 04/28/19   Elodia Florence., MD  gabapentin (NEURONTIN) 100 MG capsule Take 100 mg by mouth daily. 04/24/19   [provider]  hydrALAZINE (APRESOLINE) 25 MG tablet Take 2 tablets (50 mg total) by mouth every 8 (eight) hours. 04/28/19 05/28/19  Elodia Florence., MD  lisinopril (ZESTRIL) 20 MG tablet Take 1 tablet (20 mg total) by mouth daily. 04/28/19 05/28/19  Elodia Florence., MD  metoprolol tartrate (LOPRESSOR) 25 MG tablet Take 2 tablets (50 mg total) by mouth at bedtime. 04/27/19 05/27/19  Elodia Florence., MD  multivitamin (RENA-VIT) TABS tablet Take 1 tablet by mouth at bedtime.     [provider]  omeprazole (PRILOSEC) 40 MG capsule Take 40 mg by mouth 2 (two) times daily.    [provider]  prochlorperazine (COMPAZINE) 10 MG tablet Take 1 tablet (10 mg total) by mouth every 8 (eight) hours as needed for nausea or vomiting. 02/17/18   Mansouraty, Telford Nab., MD  promethazine (PHENERGAN) 25 MG tablet Take 1 tablet (25 mg total) by mouth every 8 (eight) hours as needed for nausea or vomiting. 04/07/18   Shirley, [place], DO    Allergies    Nsaids and Tolmetin  Review of Systems   Review of Systems  All other systems reviewed and are negative.   Physical Exam Updated Vital Signs BP (!) 170/75   Pulse 94   Temp 99.2  F (37.3 C) (Oral)   Resp 10   Ht $R'5\' 7"'Hc$  (1.702 m)   Wt 103.4 kg   SpO2 94%   BMI 35.71 kg/m   Physical Exam Vitals and nursing note reviewed. Exam conducted with a chaperone  present.  Constitutional:      Appearance: She is well-developed and well-nourished.  HENT:     Head: Normocephalic and atraumatic.  Eyes:     Conjunctiva/sclera: Conjunctivae normal.  Cardiovascular:     Rate and Rhythm: Normal rate and regular rhythm.     Comments: Right port in place Pulmonary:     Effort: Pulmonary effort is normal.     Breath sounds: Normal breath sounds.  Abdominal:     General: Bowel sounds are normal.     Palpations: Abdomen is soft.     Tenderness: There is abdominal tenderness in the suprapubic area, left upper quadrant and left lower quadrant. There is no guarding or rebound.  Genitourinary:    Comments: DRE performed with female NT chaperone present. There is some TTP on exam. Dark stool is present. No gross blood. Skin:    General: Skin is warm.  Neurological:     Mental Status: She is alert.  Psychiatric:        Mood and Affect: Mood and affect normal.        Behavior: Behavior normal.     ED Results / Procedures / Treatments   Labs (all labs ordered are listed, but only abnormal results are displayed) Labs Reviewed  COMPREHENSIVE METABOLIC PANEL - Abnormal; Notable for the following components:      Result Value   Sodium 133 (*)    Potassium 3.4 (*)    Chloride 91 (*)    Creatinine, Ser 7.99 (*)    Albumin 2.8 (*)    GFR, Estimated 6 (*)    Anion gap 16 (*)    All other components within normal limits  CBC - Abnormal; Notable for the following components:   WBC 14.5 (*)    RBC 2.55 (*)    Hemoglobin 6.9 (*)    HCT 23.8 (*)    MCHC 29.0 (*)    RDW 15.9 (*)    All other components within normal limits  HEMOGLOBIN AND HEMATOCRIT, BLOOD - Abnormal; Notable for the following components:   Hemoglobin 6.5 (*)    HCT 21.2 (*)    All other components within  normal limits  POC OCCULT BLOOD, ED - Abnormal; Notable for the following components:   Fecal Occult Bld POSITIVE (*)    All other components within normal limits  SARS CORONAVIRUS 2 BY RT PCR (HOSPITAL ORDER, [place] [facility])  HIV ANTIBODY (ROUTINE TESTING W REFLEX)  BASIC METABOLIC PANEL  CBC  CBC  POC SARS CORONAVIRUS 2 AG -  ED  POC SARS CORONAVIRUS 2 AG -  ED  TYPE AND SCREEN  PREPARE RBC (CROSSMATCH)    EKG None  Radiology CT Abdomen Pelvis W Contrast  Result Date: 02/06/2020 CLINICAL DATA:  Rectal bleeding and abdominal pain EXAM: CT ABDOMEN AND PELVIS WITH CONTRAST TECHNIQUE: Multidetector CT imaging of the abdomen and pelvis was performed using the standard protocol following bolus administration of intravenous contrast. CONTRAST:  163mL OMNIPAQUE IOHEXOL 300 MG/ML  SOLN COMPARISON:  04/04/2018 FINDINGS: Lower chest: No acute abnormality. Hepatobiliary: No focal liver abnormality is seen. No gallstones, gallbladder wall thickening, or biliary dilatation. Pancreas: Unremarkable. No pancreatic ductal dilatation or surrounding inflammatory changes. Spleen: Normal in size without focal abnormality. Adrenals/Urinary Tract: Adrenal glands are within normal limits bilaterally. Kidneys demonstrate a normal enhancement pattern with small cysts bilaterally. No renal calculi or obstructive changes are seen. The bladder is decompressed. Stomach/Bowel: Diverticular change of the colon is noted. Along  the posterior wall of the sigmoid colon interposed between the sigmoid and the rectum there is a 3.3 x 2.3 cm fluid collection which was not present on the prior exam and likely represents a small pericolonic abscess. No other inflammatory changes are seen. The appendix is prominent (13 mm) with some inspissated barium within although no inflammatory changes are seen. Small bowel and stomach appear within normal limits. Vascular/Lymphatic: Aortic atherosclerosis. No enlarged  abdominal or pelvic lymph nodes. Reproductive: Status post hysterectomy. No adnexal masses. Other: Peritoneal dialysis catheter is noted. No significant residual dialysate is seen. Small fat containing anterior abdominal wall hernias are noted stable from the prior study. No herniation of bowel loops is seen. Musculoskeletal: No acute or significant osseous findings. IMPRESSION: Changes of diverticulosis with a fluid collection posterior to the sigmoid and anterior to the rectum as described suspicious for small pericolonic abscess likely arising from the posterior wall of the sigmoid colon. Prominent appendix with inspissated barium although no inflammatory changes are seen. Stable chronic changes similar to that seen on previous exams. Electronically Signed   By: Inez Catalina M.D.   On: 02/06/2020 02:28    Procedures Procedures   Medications Ordered in ED Medications  0.9 %  sodium chloride infusion (Manually program via Guardrails IV Fluids) (has no administration in time range)  acetaminophen (TYLENOL) tablet 650 mg (has no administration in time range)    Or  acetaminophen (TYLENOL) suppository 650 mg (has no administration in time range)  fentaNYL (SUBLIMAZE) injection 25-50 mcg (has no administration in time range)  ondansetron (ZOFRAN) tablet 4 mg (has no administration in time range)    Or  ondansetron (ZOFRAN) injection 4 mg (has no administration in time range)  labetalol (NORMODYNE) injection 10 mg (has no administration in time range)  piperacillin-tazobactam (ZOSYN) IVPB 3.375 g (has no administration in time range)  acetaminophen (TYLENOL) tablet 1,000 mg (1,000 mg Oral Given 02/05/20 1843)  iohexol (OMNIPAQUE) 300 MG/ML solution 100 mL (100 mLs Intravenous Contrast Given 02/06/20 0214)  piperacillin-tazobactam (ZOSYN) IVPB 3.375 g (0 g Intravenous Stopped 02/06/20 0415)  fentaNYL (SUBLIMAZE) injection 50 mcg (50 mcg Intravenous Given 02/06/20 0356)  ondansetron (ZOFRAN) injection 4 mg  (4 mg Intravenous Given 02/06/20 0356)  ALPRAZolam Duanne Moron) tablet 0.5 mg (0.5 mg Oral Given 02/06/20 0356)    ED Course  I have reviewed the triage vital signs and the nursing notes.  Pertinent labs & imaging results that were available during my care of the patient were reviewed by me and considered in my medical decision making (see chart for details).  Clinical Course as of 02/06/20 8003  Tue Feb 06, 2020  0243 Discussed with gen surgeon Dr. Grandville Silos, recommends medical admit. Will evaluate pt. No emergent surgical intervention. [JR]  4917 Patient reporting increased pain. Will dose with medication for symptom relief. She is also requesting xanax which she is prescribed for her anxiety. [JR]  0245 Hemoglobin(!!): 6.5 Minimal change from previous [JR]    Clinical Course User Index [JR] Margretta Zamorano, [place] N, PA-C   MDM Rules/Calculators/A&P                           Presenting with rectal bleeding that began this morning.  She also has associated abdominal pain.  She is noted to be febrile on arrival to 101 F stable vital signs.  She completed her dialysis treatment today.  Notes a history of diverticulitis.  Also notes a history of GI  bleed though is unsure of the source.  She does have some to abdominal tenderness without peritoneal signs.  Her rectal exam is without gross red blood though is Hemoccult positive.  Her hemoglobin is down from baseline to 6.9 with hematocrit of 23.8.  She has leukocytosis of 14.5.  CT scan of her abdomen is ordered for evaluation of diverticulitis is suspected because of GI bleed.  Repeat H&H is also ordered considering initial hemoglobin was collected at 1850.  Vital signs remain stable.  CT scan with findings consistent with developing pericolonic abscess, no diverticulitis noted.  Discussed with general surgeon Dr. Grandville Silos.  Recommends medical admission, possible IR drain placement though agrees with medication management at this time including the antibiotic  ordered, Zosyn.  Final Clinical Impression(s) / ED Diagnoses Final diagnoses:  Pericolonic abscess  Rectal bleeding    Rx / DC Orders ED Discharge Orders    None       Ameila Weldon, [place] N, PA-C 02/06/20 4179    Maudie Flakes, MD 02/06/20 [phone]

## 2020-02-06 NOTE — Consult Note (Signed)
Reason for Consult:pericolonic abscess Referring Physician: Tim Opyd  Patricia [name] is an 45 y.o. female.  HPI: 45yo F with multiple medical problems including previous stroke, and end-stage renal disease with peritoneal dialysis catheter in place.  She is followed by Dr. Posey Pronto at [facility] and recently was placed back on hemodialysis via a dialysis catheter.  She also has a history of GI bleed in the past.  She has undergone multiple GI procedures including colonoscopy by Dr. Therisa Doyne in 2019, upper endoscopy by Dr. Alessandra Bevels, capsule endoscopy by Dr. Michail Sermon and an extended small bowel endoscopy at [facility] in June 2020.  She presents to the emergency department complaining of 24-hour history of blood per rectum and lower abdominal pain.  She also had some mild generalized abdominal pain.  She did not eat anything because she thought it would make the pain worse.  No nausea or vomiting.  She reports she has had GI bleeding requiring transfusion in the past.  Past Medical History:  Diagnosis Date  . Acid reflux   . Anemia 04/2019   REQUIRING BLOOD TRANSFUSIONS  . Anxiety   . Arthritis   . Dyspnea   . Dysrhythmia    tachycardia  . ESRD (end stage renal disease) ([place])    [facility]- [place]  . GI bleeding 12/2017  . Gout   . Headache(784.0)    "related to chemo; sometimes weekly" (09/12/2013)  . High cholesterol   . History of blood transfusion "a couple"   "related to low counts"  . Hypertension   . Iron deficiency anemia    "get epogen shots q month" (02/20/2014)  . 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 Peritoneal dialysis status ([place])   . Stroke [facility]) 08/15/2017    Past Surgical History:  Procedure Laterality Date  . ABDOMINAL HYSTERECTOMY  2010   "laparoscopic"  . ANKLE FRACTURE SURGERY Right 1994  . AV FISTULA PLACEMENT Left 04/14/2016   Procedure: ARTERIOVENOUS (AV) FISTULA CREATION;  Surgeon: Angelia Mould, MD;  Location: [facility] OR;  Service: Vascular;  Laterality: Left;  . AV FISTULA PLACEMENT Left 08/09/2017   Procedure: INSERTION OF ARTERIOVENOUS (AV) GORE-TEX GRAFT LEFT ARM;  Surgeon: Elam Dutch, MD;  Location: [place];  Service: Vascular;  Laterality: Left;  . [place] REMOVAL Left 08/11/2017   Procedure: REMOVAL OF ARTERIOVENOUS GORETEX GRAFT ([place]);  Surgeon: Serafina Mitchell, MD;  Location: [facility] OR;  Service: Vascular;  Laterality: Left;  . BIOPSY  09/03/2017   Procedure: BIOPSY;  Surgeon: Otis Brace, MD;  Location: [place];  Service: Gastroenterology;;  . BIOPSY  12/24/2017   Procedure: BIOPSY;  Surgeon: Irving Copas., MD;  Location: [place];  Service: Gastroenterology;;  . CARDIAC CATHETERIZATION  2000's  . COLONOSCOPY WITH PROPOFOL N/A 09/04/2017   Procedure: COLONOSCOPY WITH PROPOFOL;  Surgeon: Ronnette Juniper, MD;  Location: [place];  Service: Gastroenterology;  Laterality: N/A;  . ENTEROSCOPY N/A 12/24/2017   Procedure: ENTEROSCOPY;  Surgeon: Rush Landmark Telford Nab., MD;  Location: [place];  Service: Gastroenterology;  Laterality: N/A;  . ESOPHAGOGASTRODUODENOSCOPY    . ESOPHAGOGASTRODUODENOSCOPY (EGD) WITH PROPOFOL Left 06/04/2017   Procedure: ESOPHAGOGASTRODUODENOSCOPY (EGD) WITH PROPOFOL;  Surgeon: Arta Silence, MD;  Location: [place];  Service: Endoscopy;  Laterality: Left;  . ESOPHAGOGASTRODUODENOSCOPY (EGD) WITH PROPOFOL N/A 09/03/2017   Procedure: ESOPHAGOGASTRODUODENOSCOPY (EGD) WITH PROPOFOL;  Surgeon: Otis Brace, MD;  Location: [place];  Service: Gastroenterology;  Laterality: N/A;  .  FISTULA SUPERFICIALIZATION Left 04/02/2017   Procedure: FISTULA SUPERFICIALIZATION LEFT ARM;  Surgeon: Serafina Mitchell, MD;  Location: [place];  Service: Vascular;  Laterality: Left;  . FISTULOGRAM Left 04/07/2016   Procedure: FISTULOGRAM;  Surgeon: Waynetta Sandy, MD;  Location: [place];  Service: Vascular;  Laterality: Left;  . FRACTURE  SURGERY     right ankle  . GIVENS CAPSULE STUDY N/A 10/29/2017   Procedure: GIVENS CAPSULE STUDY;  Surgeon: Wilford Corner, MD;  Location: [place];  Service: Endoscopy;  Laterality: N/A;  . GIVENS CAPSULE STUDY N/A 12/24/2017   Procedure: GIVENS CAPSULE STUDY;  Surgeon: Irving Copas., MD;  Location: [place];  Service: Gastroenterology;  Laterality: N/A;  . GIVENS CAPSULE STUDY N/A 05/22/2018   Procedure: GIVENS CAPSULE STUDY;  Surgeon: Irving Copas., MD;  Location: [place];  Service: Gastroenterology;  Laterality: N/A;  . INSERTION OF DIALYSIS CATHETER Right 04/07/2016   Procedure: INSERTION OF DIALYSIS CATHETER;  Surgeon: Waynetta Sandy, MD;  Location: [place];  Service: Vascular;  Laterality: Right;  . INSERTION OF DIALYSIS CATHETER Right 04/04/2017   Procedure: INSERTION OF DIALYSIS CATHETER;  Surgeon: Waynetta Sandy, MD;  Location: [place];  Service: Vascular;  Laterality: Right;  . LEFT HEART CATH AND CORONARY ANGIOGRAPHY N/A 05/12/2019   Procedure: LEFT HEART CATH AND CORONARY ANGIOGRAPHY;  Surgeon: Dixie Dials, MD;  Location: [place] CV LAB;  Service: Cardiovascular;  Laterality: N/A;  . LIGATION OF ARTERIOVENOUS  FISTULA Left 04/14/2016   Procedure: LIGATION OF ARTERIOVENOUS  FISTULA;  Surgeon: Angelia Mould, MD;  Location: [place];  Service: Vascular;  Laterality: Left;  . PATCH ANGIOPLASTY Left 06/19/2016   Procedure: PATCH ANGIOPLASTY LEFT ARTERIOVENOUS FISTULA;  Surgeon: Angelia Mould, MD;  Location: [place];  Service: Vascular;  Laterality: Left;  . POLYPECTOMY  09/04/2017   Procedure: POLYPECTOMY;  Surgeon: Ronnette Juniper, MD;  Location: [facility];  Service: Gastroenterology;;  . REVISON OF ARTERIOVENOUS FISTULA Left 06/19/2016   Procedure: REVISION SUPERFICIALIZATION OF BRACHIOCEPHALIC ARTERIOVENOUS FISTULA;  Surgeon: Angelia Mould, MD;  [facility] OR;  Service: Vascular;  Laterality: Left;    Family  History  Problem Relation Age of Onset  . Hypertension Mother   . Thyroid disease Mother   . Coronary artery disease Father   . Hypertension Father   . Diabetes Father   . Colon cancer Maternal Aunt   . Esophageal cancer Maternal Uncle   . Inflammatory bowel disease Neg Hx   . Liver disease Neg Hx   . Pancreatic cancer Neg Hx   . Rectal cancer Neg Hx   . Stomach cancer Neg Hx     Social History:  reports that she has never smoked. She has never used smokeless tobacco. She reports that she does not drink alcohol and does not use drugs.  Allergies:  Allergies  Allergen Reactions  . Nsaids Other (See Comments)    Cannot take due to Kidney disease/Kidney function   . Tolmetin Other (See Comments)    Cannot take due to Kidney Disease    Medications: I have reviewed the patient's current medications.  Results for orders placed or performed during the hospital encounter of 02/05/20 (from the past 48 hour(s))  Comprehensive metabolic panel     Status: Abnormal   Collection Time: 02/05/20  6:50 PM  Result Value Ref Range   Sodium 133 (L) 135 - 145 mmol/L   Potassium 3.4 (L) 3.5 - 5.1 mmol/L   Chloride 91 (L) 98 -  111 mmol/L   CO2 26 22 - 32 mmol/L   Glucose, Bld 95 70 - 99 mg/dL    Comment: Glucose reference range applies only to samples taken after fasting for at least 8 hours.   BUN 19 6 - 20 mg/dL   Creatinine, Ser 7.99 (H) 0.44 - 1.00 mg/dL   Calcium 9.3 8.9 - 10.3 mg/dL   Total Protein 7.2 6.5 - 8.1 g/dL   Albumin 2.8 (L) 3.5 - 5.0 g/dL   AST 18 15 - 41 U/L   ALT 13 0 - 44 U/L   Alkaline Phosphatase 59 38 - 126 U/L   Total Bilirubin 0.7 0.3 - 1.2 mg/dL   GFR, Estimated 6 (L) >60 mL/min    Comment: (NOTE) Calculated using the CKD-EPI Creatinine Equation (2021)    Anion gap 16 (H) 5 - 15    Comment: Performed at [facility], [place] [address] [place] [address]  CBC     Status: Abnormal   Collection Time: 02/05/20  6:50 PM  Result Value Ref Range   WBC  14.5 (H) 4.0 - 10.5 K/uL   RBC 2.55 (L) 3.87 - 5.11 MIL/uL   Hemoglobin 6.9 (LL) 12.0 - 15.0 g/dL    Comment: REPEATED TO VERIFY THIS CRITICAL RESULT HAS VERIFIED AND BEEN CALLED TO K PATE RN BY KIRSTENE FORSYTH ON 01 31 2022 AT 1924, AND HAS BEEN READ BACK.     HCT 23.8 (L) 36.0 - 46.0 %    Comment: REPEATED TO VERIFY   MCV 93.3 80.0 - 100.0 fL   MCH 27.1 26.0 - 34.0 pg   MCHC 29.0 (L) 30.0 - 36.0 g/dL   RDW 15.9 (H) 11.5 - 15.5 %   Platelets 277 150 - 400 K/uL   nRBC 0.0 0.0 - 0.2 %    Comment: Performed at [facility] [address], [place], [place] [address]  Type and screen [place]     Status: None   Collection Time: 02/05/20  6:50 PM  Result Value Ref Range   ABO/RH(D) O NEG    Antibody Screen NEG    Sample Expiration      02/08/2020,2359 Performed at [facility], [place] [address], [place],  [address]   POC SARS Coronavirus 2 Ag-ED - Nasal Swab (BD Veritor Kit)     Status: None   Collection Time: 02/05/20  7:10 PM  Result Value Ref Range   SARS Coronavirus 2 Ag NEGATIVE NEGATIVE    Comment: (NOTE) SARS-CoV-2 antigen NOT DETECTED.   Negative results are presumptive.  Negative results do not preclude SARS-CoV-2 infection and should not be used as the sole basis for treatment or other patient management decisions, including infection  control decisions, particularly in the presence of clinical signs and  symptoms consistent with COVID-19, or in those who have been in contact with the virus.  Negative results must be combined with clinical observations, patient history, and epidemiological information. The expected result is Negative.  Fact Sheet for Patients: HandmadeRecipes.com.cy  Fact Sheet for Healthcare Providers: FuneralLife.at  This test is not yet approved or cleared by the [place] FDA and  has been authorized for detection and/or diagnosis of SARS-CoV-2 by FDA under  an Emergency Use Authorization (EUA).  This EUA will remain in effect (meaning this test can be used) for the duration of  the COV ID-19 declaration under Section 564(b)(1) of the Act, 21 U.S.C. section 360bbb-3(b)(1), unless the  authorization is terminated or revoked sooner.    POC SARS Coronavirus 2 Ag-ED -     Status: None   Collection Time: 02/05/20  7:28 PM  Result Value Ref Range   SARS Coronavirus 2 Ag NEGATIVE NEGATIVE    Comment: (NOTE) SARS-CoV-2 antigen NOT DETECTED.   Negative results are presumptive.  Negative results do not preclude SARS-CoV-2 infection and should not be used as the sole basis for treatment or other patient management decisions, including infection  control decisions, particularly in the presence of clinical signs and  symptoms consistent with COVID-19, or in those who have been in contact with the virus.  Negative results must be combined with clinical observations, patient history, and epidemiological information. The expected result is Negative.  Fact Sheet for Patients: HandmadeRecipes.com.cy  Fact Sheet for Healthcare Providers: FuneralLife.at  This test is not yet approved or cleared by the [place] FDA and  has been authorized for detection and/or diagnosis of SARS-CoV-2 by FDA under an Emergency Use Authorization (EUA).  This EUA will remain in effect (meaning this test can be used) for the duration of  the COV ID-19 declaration under Section 564(b)(1) of the Act, 21 U.S.C. section 360bbb-3(b)(1), unless the authorization is terminated or revoked sooner.    POC occult blood, ED     Status: Abnormal   Collection Time: 02/06/20  1:03 AM  Result Value Ref Range   Fecal Occult Bld POSITIVE (A) NEGATIVE  Hemoglobin and hematocrit, blood     Status: Abnormal   Collection Time: 02/06/20  1:20 AM  Result Value Ref Range   Hemoglobin 6.5 (LL) 12.0 - 15.0 g/dL    Comment: REPEATED TO  VERIFY CRITICAL VALUE NOTED.  VALUE IS CONSISTENT WITH PREVIOUSLY REPORTED AND CALLED VALUE.    HCT 21.2 (L) 36.0 - 46.0 %    Comment: Performed at [facility], [place] [address] [place], [place] [address]  SARS Coronavirus 2 by RT PCR (hospital order, performed in [facility] hospital lab) Nasopharyngeal Nasopharyngeal Swab     Status: None   Collection Time: 02/06/20  1:29 AM   Specimen: Nasopharyngeal Swab  Result Value Ref Range   SARS Coronavirus 2 NEGATIVE NEGATIVE    Comment: (NOTE) SARS-CoV-2 target nucleic acids are NOT DETECTED.  The SARS-CoV-2 RNA is generally detectable in upper and lower respiratory specimens during the acute phase of infection. The lowest concentration of SARS-CoV-2 viral copies this assay can detect is 250 copies / mL. A negative result does not preclude SARS-CoV-2 infection and should not be used as the sole basis for treatment or other patient management decisions.  A negative result may occur with improper specimen collection / handling, submission of specimen other than nasopharyngeal swab, presence of viral mutation(s) within the areas targeted by this assay, and inadequate number of viral copies (<250 copies / mL). A negative result must be combined with clinical observations, patient history, and epidemiological information.  Fact Sheet for Patients:   StrictlyIdeas.no  Fact Sheet for Healthcare Providers: BankingDealers.co.za  This test is not yet approved or  cleared by the [place] FDA and has been authorized for detection and/or diagnosis of SARS-CoV-2 by FDA under an Emergency Use Authorization (EUA).  This EUA will remain in effect (meaning this test can be used) for the duration of the COVID-19 declaration under Section 564(b)(1) of the Act, 21 U.S.C. section 360bbb-3(b)(1), unless the authorization is terminated or revoked sooner.  Performed at [facility], [place]  [address] [place], [place] [address]     CT Abdomen Pelvis W Contrast  Result Date: 02/06/2020 CLINICAL DATA:  Rectal bleeding and abdominal pain EXAM: CT ABDOMEN AND PELVIS WITH CONTRAST TECHNIQUE: Multidetector CT imaging of the abdomen and pelvis was performed using the standard protocol following bolus administration of intravenous contrast. CONTRAST:  133m OMNIPAQUE IOHEXOL 300 MG/ML  SOLN COMPARISON:  04/04/2018 FINDINGS: Lower chest: No acute abnormality. Hepatobiliary: No focal liver abnormality is seen. No gallstones, gallbladder wall thickening, or biliary dilatation. Pancreas: Unremarkable. No pancreatic ductal dilatation or surrounding inflammatory changes. Spleen: Normal in size without focal abnormality. Adrenals/Urinary Tract: Adrenal glands are within normal limits bilaterally. Kidneys demonstrate a normal enhancement pattern with small cysts bilaterally. No renal calculi or obstructive changes are seen. The bladder is decompressed. Stomach/Bowel: Diverticular change of the colon is noted. Along the posterior wall of the sigmoid colon interposed between the sigmoid and the rectum there is a 3.3 x 2.3 cm fluid collection which was not present on the prior exam and likely represents a small pericolonic abscess. No other inflammatory changes are seen. The appendix is prominent (13 mm) with some inspissated barium within although no inflammatory changes are seen. Small bowel and stomach appear within normal limits. Vascular/Lymphatic: Aortic atherosclerosis. No enlarged abdominal or pelvic lymph nodes. Reproductive: Status post hysterectomy. No adnexal masses. Other: Peritoneal dialysis catheter is noted. No significant residual dialysate is seen. Small fat containing anterior abdominal wall hernias are noted stable from the prior study. No herniation of bowel loops is seen. Musculoskeletal: No acute or significant osseous findings. IMPRESSION: Changes of diverticulosis with a fluid collection posterior  to the sigmoid and anterior to the rectum as described suspicious for small pericolonic abscess likely arising from the posterior wall of the sigmoid colon. Prominent appendix with inspissated barium although no inflammatory changes are seen. Stable chronic changes similar to that seen on previous exams. Electronically Signed   By: MInez CatalinaM.D.   On: 02/06/2020 02:28    Review of Systems  Constitutional: Negative.   HENT: Negative.   Eyes: Negative.   Respiratory: Negative for chest tightness and shortness of breath.   Cardiovascular: Negative for chest pain.  Gastrointestinal: Positive for abdominal pain and blood in stool. Negative for constipation, diarrhea, nausea, rectal pain and vomiting.  Endocrine: Negative.   Genitourinary:       Above  Musculoskeletal: Negative.   Skin: Negative.   Allergic/Immunologic: Negative.   Neurological: Negative.   Hematological: Negative.   Psychiatric/Behavioral: Negative.    Blood pressure (!) 188/89, pulse 87, temperature 98.2 F (36.8 C), temperature source Oral, resp. rate 18, height '5\' 7"'  (1.702 m), weight 103.4 kg, SpO2 100 %. Physical Exam Constitutional:      General: She is not in acute distress.    Appearance: She is obese.  HENT:     Head: Normocephalic.  Cardiovascular:     Rate and Rhythm: Normal rate and regular rhythm.  No extrasystoles are present. Pulmonary:     Effort: Pulmonary effort is normal. No tachypnea or respiratory distress.     Breath sounds: Normal breath sounds. No decreased breath sounds, wheezing or rhonchi.  Chest:     Chest wall: No mass or tenderness.  Abdominal:     General: Bowel sounds are normal.     Palpations: Abdomen is soft.     Tenderness: There is abdominal tenderness. There is no guarding or rebound.     Comments: Mild lower abdominal tenderness in the suprapubic region,  no generalized tenderness, no peritonitis  Musculoskeletal:     Cervical back: Neck supple.  Skin:    General: Skin  is warm and dry.     Capillary Refill: Capillary refill takes 2 to 3 seconds.  Neurological:     Mental Status: She is alert and oriented to person, place, and time.  Psychiatric:        Mood and Affect: Mood normal.     Assessment/Plan: Pericolonic abscess between the rectum and sigmoid colon -recommend bowel rest and IV antibiotics.  Likely not amenable to IR drainage but we will evaluate further.  No need for emergent surgery.  We will follow.  GI bleed -history of same, recommend GI consultation and will likely need transfusion  ESRD -Per Renal  Agree with medical admission  Zenovia Jarred 02/06/2020, 3:15 AM

## 2020-02-07 DIAGNOSIS — K651 Peritoneal abscess: Secondary | ICD-10-CM

## 2020-02-07 DIAGNOSIS — K625 Hemorrhage of anus and rectum: Secondary | ICD-10-CM

## 2020-02-07 DIAGNOSIS — K630 Abscess of intestine: Secondary | ICD-10-CM | POA: Diagnosis not present

## 2020-02-07 LAB — CBC
HCT: 20.6 % — ABNORMAL LOW (ref 36.0–46.0)
HCT: 21.9 % — ABNORMAL LOW (ref 36.0–46.0)
Hemoglobin: 7 g/dL — ABNORMAL LOW (ref 12.0–15.0)
Hemoglobin: 7.1 g/dL — ABNORMAL LOW (ref 12.0–15.0)
MCH: 28.8 pg (ref 26.0–34.0)
MCH: 28 pg (ref 26.0–34.0)
MCHC: 34 g/dL (ref 30.0–36.0)
MCHC: 32.4 g/dL (ref 30.0–36.0)
MCV: 84.8 fL (ref 80.0–100.0)
MCV: 86.2 fL (ref 80.0–100.0)
Platelets: 270 10*3/uL (ref 150–400)
Platelets: 279 10*3/uL (ref 150–400)
RBC: 2.43 MIL/uL — ABNORMAL LOW (ref 3.87–5.11)
RBC: 2.54 MIL/uL — ABNORMAL LOW (ref 3.87–5.11)
RDW: 15 % (ref 11.5–15.5)
RDW: 14.8 % (ref 11.5–15.5)
WBC: 13.5 10*3/uL — ABNORMAL HIGH (ref 4.0–10.5)
WBC: 13.3 10*3/uL — ABNORMAL HIGH (ref 4.0–10.5)
nRBC: 0 % (ref 0.0–0.2)
nRBC: 0 % (ref 0.0–0.2)

## 2020-02-07 LAB — TYPE AND SCREEN
ABO/RH(D): O NEG
Antibody Screen: NEGATIVE
Unit division: 0

## 2020-02-07 LAB — HEMOGLOBIN AND HEMATOCRIT, BLOOD
HCT: 20.5 % — ABNORMAL LOW (ref 36.0–46.0)
Hemoglobin: 7.1 g/dL — ABNORMAL LOW (ref 12.0–15.0)

## 2020-02-07 LAB — BPAM RBC
Blood Product Expiration Date: 202202032359
ISSUE DATE / TIME: 202202010434
Unit Type and Rh: 9500

## 2020-02-07 LAB — BASIC METABOLIC PANEL
Anion gap: 19 — ABNORMAL HIGH (ref 5–15)
BUN: 34 mg/dL — ABNORMAL HIGH (ref 6–20)
CO2: 24 mmol/L (ref 22–32)
Calcium: 9.3 mg/dL (ref 8.9–10.3)
Chloride: 91 mmol/L — ABNORMAL LOW (ref 98–111)
Creatinine, Ser: 11.67 mg/dL — ABNORMAL HIGH (ref 0.44–1.00)
GFR, Estimated: 4 mL/min — ABNORMAL LOW (ref >60)
Glucose, Bld: 87 mg/dL (ref 70–99)
Potassium: 3.6 mmol/L (ref 3.5–5.1)
Sodium: 134 mmol/L — ABNORMAL LOW (ref 135–145)

## 2020-02-07 LAB — MRSA PCR SCREENING: MRSA by PCR: NEGATIVE

## 2020-02-07 MED ORDER — INFLUENZA VAC SPLIT QUAD 0.5 ML IM SUSY
0.5000 mL | PREFILLED_SYRINGE | INTRAMUSCULAR | Status: AC
  Administered 2020-02-16: 0.5 mL via INTRAMUSCULAR
  Filled 2020-02-07: qty 0.5

## 2020-02-07 MED ORDER — CHLORHEXIDINE GLUCONATE CLOTH 2 % EX PADS: 6.0000 | MEDICATED_PAD | Freq: Every day | CUTANEOUS | Status: DC

## 2020-02-07 MED ORDER — PANTOPRAZOLE SODIUM 40 MG PO TBEC
40.0000 mg | DELAYED_RELEASE_TABLET | Freq: Every day | ORAL | Status: DC
  Administered 2020-02-08 – 2020-02-16 (×9): 40 mg via ORAL
  Filled 2020-02-07 (×9): qty 1

## 2020-02-07 NOTE — Progress Notes (Signed)
PROGRESS NOTE    Patricia [name]  YKZ:[id number] DOB: 11-09-1975 DOA: 02/05/2020 PCP: Doyle Askew, PA-C   Brief Narrative:  Patricia [name] is a 45 y.o. female with medical history significant for ESRD on hemodialysis MWF, hypertension, anxiety, and history of recurrent GI bleeding, now presenting to the emergency department for evaluation of abdominal pain and rectal bleeding.  Patient reports 1 day of pain in the lower abdomen, fatigue, exertional dyspnea, and rectal bleeding.  She describes having an urge to defecate and passing bright red blood and clots.  She has had several episodes of this bleeding.  The pain has been constant for the past day, was worse after she ate something, and so she has not eaten anything since yesterday.  She denies similar symptoms previously and notes that she had dark stool with her previous GI bleeds.  Patient has had multiple endoscopic evaluations with GI revealing multiple issues including distal small bowel AVMs, nonbleeding gastric ulcers, erosive gastritis, duodenitis, diverticulosis and polyps. Upon arrival to the ED fecal occult blood testing was found to be positive.  CT abdomen shows pericolonic abscess.  Surgery and GI contacted by ED physician for further evaluation and treatment, hospitalist group called for admission.  Assessment & Plan:   Principal Problem:   Pericolonic abscess Active Problems:   Hypertension   Symptomatic anemia   GI bleed   Acute on chronic blood loss anemia   Anemia   ESRD on hemodialysis (HCC)  Sepsis secondary to pericolonic abscess, POA  - 3.3 x 2.3 cm pericolonic abscess on CT  - Abscess currently not amenable to drainage, appreciate GI and surgery evaluation and expertise - Continue conservative management currently with IV Zosyn, bowel rest IV fluids and symptom control.  Likely advance diet per GI to clear liquids as tolerated  Lower GI bleeding likely secondary to above with acute  symptomatic blood loss anemia on chronic anemia of chronic disease, POA   - Appears to be recurrent given patient's history of endoscopy as above - GI following, appreciate insight recommendations, continue conservative management transfuse if hemoglobin less than 7 or symptomatic. -Defer to GI for possible endoscopy, brown stool noted today -bleeding may be resolving  ESRD MWF - Nephrology following, appreciate insight and recommendations -continue Monday Wednesday Friday schedule  Hypertension  - Labetalol as needed for now     DVT prophylaxis: SCDs Code Status: Full  Family Communication: None  Status is: Inpatient  Dispo: The patient is from: Home              Anticipated d/c is to: Home              Anticipated d/c date is: > 72 hours              Patient currently not medically stable for discharge  Consultants:   GI, general surgery, nephrology  Procedures:   None  Antimicrobials:  Zosyn, 02/05/2020, ongoing  Subjective: No acute issues or events overnight, patient reports ongoing brown stool with no melena or bright red blood per rectum.  Denies chest pain abdominal pain nausea vomiting diarrhea constipation headache fevers or chills.  Patient's only complaint at this point is hunger given she is still on ice chips only.  Objective: Vitals:   02/06/20 1747 02/06/20 1945 02/06/20 2300 02/07/20 0604  BP: (!) 180/88 (!) 186/84 (!) 180/97 (!) 174/91  Pulse:  89 90 95  Resp:  $Remo'19 20 20  'RRUpT$ Temp:  99.3 F (37.4 C) 98.9  F (37.2 C) 98.6 F (37 C)  TempSrc:  Oral Oral Oral  SpO2:   97% 98%  Weight:      Height:        Intake/Output Summary (Last 24 hours) at 02/07/2020 0812 Last data filed at 02/06/2020 1510 Gross per 24 hour  Intake 44.34 ml  Output --  Net 44.34 ml   Filed Weights   02/05/20 1831 02/06/20 1621  Weight: 103.4 kg 105.1 kg    Examination:  General exam: Appears calm and comfortable  Respiratory system: Clear to auscultation. Respiratory  effort normal. Cardiovascular system: S1 & S2 heard, RRR. No JVD, murmurs, rubs, gallops or clicks. No pedal edema. Gastrointestinal system: Abdomen is nondistended, soft and nontender. No organomegaly or masses felt. Normal bowel sounds heard. Central nervous system: Alert and oriented. No focal neurological deficits. Extremities: Symmetric 5 x 5 power. Skin: No rashes, lesions or ulcers Psychiatry: Judgement and insight appear normal. Mood & affect appropriate.     Data Reviewed: I have personally reviewed following labs and imaging studies  CBC: Recent Labs  Lab 02/05/20 1850 02/06/20 0120 02/06/20 0835 02/06/20 1627 02/07/20 0223 02/07/20 0702  WBC 14.5*  --  13.8* 14.8* 13.5* 13.3*  NEUTROABS  --   --  10.8*  --   --   --   HGB 6.9* 6.5* 7.3* 8.0* 7.0* 7.1*  HCT 23.8* 21.2* 23.3* 24.1* 20.6* 21.9*  MCV 93.3  --  87.9 85.8 84.8 86.2  PLT 277  --  259 289 270 945   Basic Metabolic Panel: Recent Labs  Lab 02/05/20 1850 02/06/20 0827 02/07/20 0702  NA 133* 133* 134*  K 3.4* 3.6 3.6  CL 91* 93* 91*  CO2 $Re'26 25 24  'Pho$ GLUCOSE 95 80 87  BUN 19 26* 34*  CREATININE 7.99* 9.08* 11.67*  CALCIUM 9.3 9.0 9.3   GFR: Estimated Creatinine Clearance: 7.5 mL/min (A) (by C-G formula based on SCr of 11.67 mg/dL (H)). Liver Function Tests: Recent Labs  Lab 02/05/20 1850  AST 18  ALT 13  ALKPHOS 59  BILITOT 0.7  PROT 7.2  ALBUMIN 2.8*   No results for input(s): LIPASE, AMYLASE in the last 168 hours. No results for input(s): AMMONIA in the last 168 hours. Coagulation Profile: No results for input(s): INR, PROTIME in the last 168 hours. Cardiac Enzymes: No results for input(s): CKTOTAL, CKMB, CKMBINDEX, TROPONINI in the last 168 hours. BNP (last 3 results) No results for input(s): PROBNP in the last 8760 hours. HbA1C: No results for input(s): HGBA1C in the last 72 hours. CBG: No results for input(s): GLUCAP in the last 168 hours. Lipid Profile: No results for input(s):  CHOL, HDL, LDLCALC, TRIG, CHOLHDL, LDLDIRECT in the last 72 hours. Thyroid Function Tests: No results for input(s): TSH, T4TOTAL, FREET4, T3FREE, THYROIDAB in the last 72 hours. Anemia Panel: No results for input(s): VITAMINB12, FOLATE, FERRITIN, TIBC, IRON, RETICCTPCT in the last 72 hours. Sepsis Labs: No results for input(s): PROCALCITON, LATICACIDVEN in the last 168 hours.  Recent Results (from the past 240 hour(s))  SARS Coronavirus 2 by RT PCR (hospital order, performed in [facility] hospital lab) Nasopharyngeal Nasopharyngeal Swab     Status: None   Collection Time: 02/06/20  1:29 AM   Specimen: Nasopharyngeal Swab  Result Value Ref Range Status   SARS Coronavirus 2 NEGATIVE NEGATIVE Final    Comment: (NOTE) SARS-CoV-2 target nucleic acids are NOT DETECTED.  The SARS-CoV-2 RNA is generally detectable in upper and lower respiratory specimens  during the acute phase of infection. The lowest concentration of SARS-CoV-2 viral copies this assay can detect is 250 copies / mL. A negative result does not preclude SARS-CoV-2 infection and should not be used as the sole basis for treatment or other patient management decisions.  A negative result may occur with improper specimen collection / handling, submission of specimen other than nasopharyngeal swab, presence of viral mutation(s) within the areas targeted by this assay, and inadequate number of viral copies (<250 copies / mL). A negative result must be combined with clinical observations, patient history, and epidemiological information.  Fact Sheet for Patients:   StrictlyIdeas.no  Fact Sheet for Healthcare Providers: BankingDealers.co.za  This test is not yet approved or  cleared by the [place] FDA and has been authorized for detection and/or diagnosis of SARS-CoV-2 by FDA under an Emergency Use Authorization (EUA).  This EUA will remain in effect (meaning this test can be  used) for the duration of the COVID-19 declaration under Section 564(b)(1) of the Act, 21 U.S.C. section 360bbb-3(b)(1), unless the authorization is terminated or revoked sooner.  Performed at [facility], [place] [address], [place], [place] [address]          Radiology Studies: CT Abdomen Pelvis W Contrast  Result Date: 02/06/2020 CLINICAL DATA:  Rectal bleeding and abdominal pain EXAM: CT ABDOMEN AND PELVIS WITH CONTRAST TECHNIQUE: Multidetector CT imaging of the abdomen and pelvis was performed using the standard protocol following bolus administration of intravenous contrast. CONTRAST:  113mL OMNIPAQUE IOHEXOL 300 MG/ML  SOLN COMPARISON:  04/04/2018 FINDINGS: Lower chest: No acute abnormality. Hepatobiliary: No focal liver abnormality is seen. No gallstones, gallbladder wall thickening, or biliary dilatation. Pancreas: Unremarkable. No pancreatic ductal dilatation or surrounding inflammatory changes. Spleen: Normal in size without focal abnormality. Adrenals/Urinary Tract: Adrenal glands are within normal limits bilaterally. Kidneys demonstrate a normal enhancement pattern with small cysts bilaterally. No renal calculi or obstructive changes are seen. The bladder is decompressed. Stomach/Bowel: Diverticular change of the colon is noted. Along the posterior wall of the sigmoid colon interposed between the sigmoid and the rectum there is a 3.3 x 2.3 cm fluid collection which was not present on the prior exam and likely represents a small pericolonic abscess. No other inflammatory changes are seen. The appendix is prominent (13 mm) with some inspissated barium within although no inflammatory changes are seen. Small bowel and stomach appear within normal limits. Vascular/Lymphatic: Aortic atherosclerosis. No enlarged abdominal or pelvic lymph nodes. Reproductive: Status post hysterectomy. No adnexal masses. Other: Peritoneal dialysis catheter is noted. No significant residual dialysate is seen.  Small fat containing anterior abdominal wall hernias are noted stable from the prior study. No herniation of bowel loops is seen. Musculoskeletal: No acute or significant osseous findings. IMPRESSION: Changes of diverticulosis with a fluid collection posterior to the sigmoid and anterior to the rectum as described suspicious for small pericolonic abscess likely arising from the posterior wall of the sigmoid colon. Prominent appendix with inspissated barium although no inflammatory changes are seen. Stable chronic changes similar to that seen on previous exams. Electronically Signed   By: Inez Catalina M.D.   On: 02/06/2020 02:28   Scheduled Meds: . Chlorhexidine Gluconate Cloth  6 each Topical Daily   Continuous Infusions: . sodium chloride 250 mL (02/06/20 2156)  . piperacillin-tazobactam (ZOSYN)  IV 2.25 g (02/07/20 0641)    LOS: 1 day   Time spent: 40 min  Little Ishikawa, DO [facility]  If 7PM-7AM, please  contact night-coverage [email or web address]  02/07/2020, 8:12 AM

## 2020-02-07 NOTE — Progress Notes (Signed)
   02/06/20 2345  Provider Notification  Provider Name/Title Shalhoun MD  Date Provider Notified 02/06/20  Time Provider Notified 2257  Notification Type Page  Notification Reason Other (Comment) (Patient requesting home meds, especially Xanax)  Response See new orders  Date of Provider Response 02/06/20  Time of Provider Response 2327

## 2020-02-07 NOTE — Progress Notes (Signed)
Subjective/Chief Complaint: No ab pain, some blood per rectum, no n/v   Objective: Vital signs in last 24 hours: Temp:  [98.6 F (37 C)-99.3 F (37.4 C)] 98.6 F (37 C) (02/02 0604) Pulse Rate:  [83-95] 95 (02/02 0604) Resp:  [14-29] 20 (02/02 0604) BP: (174-207)/(83-106) 174/91 (02/02 0604) SpO2:  [94 %-99 %] 98 % (02/02 0604) Weight:  [105.1 kg] 105.1 kg (02/01 1621) Last BM Date: 02/06/20  Intake/Output from previous day: 02/01 0701 - 02/02 0700 In: 44.3 [IV Piggyback:44.3] Out: -  Intake/Output this shift: No intake/output data recorded.  GI: obese has bs soft nontender nondistended  Lab Results:  Recent Labs    02/07/20 0223 02/07/20 0702  WBC 13.5* 13.3*  HGB 7.0* 7.1*  HCT 20.6* 21.9*  PLT 270 279   BMET Recent Labs    02/06/20 0827 02/07/20 0702  NA 133* 134*  K 3.6 3.6  CL 93* 91*  CO2 25 24  GLUCOSE 80 87  BUN 26* 34*  CREATININE 9.08* 11.67*  CALCIUM 9.0 9.3   PT/INR No results for input(s): LABPROT, INR in the last 72 hours. ABG No results for input(s): PHART, HCO3 in the last 72 hours.  Invalid input(s): PCO2, PO2  Studies/Results: CT Abdomen Pelvis W Contrast  Result Date: 02/06/2020 CLINICAL DATA:  Rectal bleeding and abdominal pain EXAM: CT ABDOMEN AND PELVIS WITH CONTRAST TECHNIQUE: Multidetector CT imaging of the abdomen and pelvis was performed using the standard protocol following bolus administration of intravenous contrast. CONTRAST:  125mL OMNIPAQUE IOHEXOL 300 MG/ML  SOLN COMPARISON:  04/04/2018 FINDINGS: Lower chest: No acute abnormality. Hepatobiliary: No focal liver abnormality is seen. No gallstones, gallbladder wall thickening, or biliary dilatation. Pancreas: Unremarkable. No pancreatic ductal dilatation or surrounding inflammatory changes. Spleen: Normal in size without focal abnormality. Adrenals/Urinary Tract: Adrenal glands are within normal limits bilaterally. Kidneys demonstrate a normal enhancement pattern with  small cysts bilaterally. No renal calculi or obstructive changes are seen. The bladder is decompressed. Stomach/Bowel: Diverticular change of the colon is noted. Along the posterior wall of the sigmoid colon interposed between the sigmoid and the rectum there is a 3.3 x 2.3 cm fluid collection which was not present on the prior exam and likely represents a small pericolonic abscess. No other inflammatory changes are seen. The appendix is prominent (13 mm) with some inspissated barium within although no inflammatory changes are seen. Small bowel and stomach appear within normal limits. Vascular/Lymphatic: Aortic atherosclerosis. No enlarged abdominal or pelvic lymph nodes. Reproductive: Status post hysterectomy. No adnexal masses. Other: Peritoneal dialysis catheter is noted. No significant residual dialysate is seen. Small fat containing anterior abdominal wall hernias are noted stable from the prior study. No herniation of bowel loops is seen. Musculoskeletal: No acute or significant osseous findings. IMPRESSION: Changes of diverticulosis with a fluid collection posterior to the sigmoid and anterior to the rectum as described suspicious for small pericolonic abscess likely arising from the posterior wall of the sigmoid colon. Prominent appendix with inspissated barium although no inflammatory changes are seen. Stable chronic changes similar to that seen on previous exams. Electronically Signed   By: Inez Catalina M.D.   On: 02/06/2020 02:28    Anti-infectives: Anti-infectives (From admission, onward)   Start     Dose/Rate Route Frequency Ordered Stop   02/06/20 2000  piperacillin-tazobactam (ZOSYN) IVPB 3.375 g  Status:  Discontinued       Note to Pharmacy: Zosyn 3.375 g IV q12h in ESRD on HD   3.375 g  12.5 mL/hr over 240 Minutes Intravenous Every 12 hours 02/06/20 0352 02/06/20 0806   02/06/20 1200  piperacillin-tazobactam (ZOSYN) IVPB 2.25 g       Note to Pharmacy: Zosyn 3.375 g IV q12h in ESRD on HD    2.25 g 100 mL/hr over 30 Minutes Intravenous Every 8 hours 02/06/20 0806     02/06/20 0245  piperacillin-tazobactam (ZOSYN) IVPB 3.375 g        3.375 g 100 mL/hr over 30 Minutes Intravenous  Once 02/06/20 0238 02/06/20 0415      Assessment/Plan: Pericolonic abscess between the rectum and sigmoid colon -recommend bowel rest and IV antibiotics.  Likely not amenable to IR drainage.  No need for emergent surgery.  consider repeat ct scan 48 hours or so  GI bleed -history of same, GI following and will likely need transfusion  ESRD -Per Renal  Rolm Bookbinder 02/07/2020

## 2020-02-07 NOTE — Consult Note (Addendum)
[facility] Renal Consultation Note    Indication for Consultation:  Management of ESRD/hemodialysis, anemia, hypertension/volume, and secondary hyperparathyroidism.  HPI: Patricia [name] is a 45 y.o. female with PMH including ESRD, GI bleed, anxiety, HTN, and CVA who presented to the ED on 02/05/20 with abdominal pain and rectal bleeding. She reported passing bright red clood clots. Reported she had GI bleeds in the past but typically had melena. On arrival to the ED, she was hypotensive and mildly hypokalemic with K 3.4. Hgb was 6.9. COVID negative. Received 1 unit PRBC. CT showed pericolonic abscess. Surgery was consulted and recommended bowel rest and zosyn. GI was consulted and recommended continuing zosyn with close monitoring.  Patient currently goes to hemodialysis on MWF schedule. Reports her last dialysis was on Monday and she rena for 4 hours with no known complications. She was on PD until last week but reports she had insufficient clearances and so she transitioned back to HD. She has a [facility] for access. There is an old fistula in her LUE which is non-functional. Today, pt reports she feels "worn out." She denies SOB, CP, palpitations, abdominal pain, nausea and vomiting. No further bleeding reported but she has not eaten yet. No fever or chills. Reports she has mild swelling in her feet.   Past Medical History:  Diagnosis Date  . Acid reflux   . Anemia 04/2019   REQUIRING BLOOD TRANSFUSIONS  . Anxiety   . Arthritis   . Dyspnea   . Dysrhythmia    tachycardia  . ESRD (end stage renal disease) ([place])    [facility]- [place]  . GI bleeding 12/2017  . Gout   . Headache(784.0)    "related to chemo; sometimes weekly" (09/12/2013)  . High cholesterol   . History of blood transfusion "a couple"   "related to low counts"  . Hypertension   . Iron deficiency anemia    "get epogen shots q month" (02/20/2014)  . MCGN (minimal change glomerulonephritis)    "using chemo  to tx;  finished my last tx in 12/2013"  . Peritoneal dialysis status ([place])   . Stroke [facility]) 08/15/2017   Past Surgical History:  Procedure Laterality Date  . ABDOMINAL HYSTERECTOMY  2010   "laparoscopic"  . ANKLE FRACTURE SURGERY Right 1994  . AV FISTULA PLACEMENT Left 04/14/2016   Procedure: ARTERIOVENOUS (AV) FISTULA CREATION;  Surgeon: Angelia Mould, MD;  Location: [facility] OR;  Service: Vascular;  Laterality: Left;  . AV FISTULA PLACEMENT Left 08/09/2017   Procedure: INSERTION OF ARTERIOVENOUS (AV) GORE-TEX GRAFT LEFT ARM;  Surgeon: Elam Dutch, MD;  Location: [place];  Service: Vascular;  Laterality: Left;  . Rew REMOVAL Left 08/11/2017   Procedure: REMOVAL OF ARTERIOVENOUS GORETEX GRAFT ([place]);  Surgeon: Serafina Mitchell, MD;  Location: [facility] OR;  Service: Vascular;  Laterality: Left;  . BIOPSY  09/03/2017   Procedure: BIOPSY;  Surgeon: Otis Brace, MD;  Location: [place];  Service: Gastroenterology;;  . BIOPSY  12/24/2017   Procedure: BIOPSY;  Surgeon: Irving Copas., MD;  Location: [place];  Service: Gastroenterology;;  . CARDIAC CATHETERIZATION  2000's  . COLONOSCOPY WITH PROPOFOL N/A 09/04/2017   Procedure: COLONOSCOPY WITH PROPOFOL;  Surgeon: Ronnette Juniper, MD;  Location: [place];  Service: Gastroenterology;  Laterality: N/A;  . ENTEROSCOPY N/A 12/24/2017   Procedure: ENTEROSCOPY;  Surgeon: Rush Landmark Telford Nab., MD;  Location: [place];  Service: Gastroenterology;  Laterality: N/A;  . ESOPHAGOGASTRODUODENOSCOPY    . ESOPHAGOGASTRODUODENOSCOPY (EGD) WITH PROPOFOL Left  06/04/2017   Procedure: ESOPHAGOGASTRODUODENOSCOPY (EGD) WITH PROPOFOL;  Surgeon: Arta Silence, MD;  Location: [facility] ENDOSCOPY;  Service: Endoscopy;  Laterality: Left;  . ESOPHAGOGASTRODUODENOSCOPY (EGD) WITH PROPOFOL N/A 09/03/2017   Procedure: ESOPHAGOGASTRODUODENOSCOPY (EGD) WITH PROPOFOL;  Surgeon: Otis Brace, MD;  Location: [place];  Service: Gastroenterology;   Laterality: N/A;  . FISTULA SUPERFICIALIZATION Left 04/02/2017   Procedure: FISTULA SUPERFICIALIZATION LEFT ARM;  Surgeon: Serafina Mitchell, MD;  Location: [place];  Service: Vascular;  Laterality: Left;  . FISTULOGRAM Left 04/07/2016   Procedure: FISTULOGRAM;  Surgeon: Waynetta Sandy, MD;  Location: [place];  Service: Vascular;  Laterality: Left;  . FRACTURE SURGERY     right ankle  . GIVENS CAPSULE STUDY N/A 10/29/2017   Procedure: GIVENS CAPSULE STUDY;  Surgeon: Wilford Corner, MD;  Location: [place];  Service: Endoscopy;  Laterality: N/A;  . GIVENS CAPSULE STUDY N/A 12/24/2017   Procedure: GIVENS CAPSULE STUDY;  Surgeon: Irving Copas., MD;  Location: [place];  Service: Gastroenterology;  Laterality: N/A;  . GIVENS CAPSULE STUDY N/A 05/22/2018   Procedure: GIVENS CAPSULE STUDY;  Surgeon: Irving Copas., MD;  Location: [place];  Service: Gastroenterology;  Laterality: N/A;  . INSERTION OF DIALYSIS CATHETER Right 04/07/2016   Procedure: INSERTION OF DIALYSIS CATHETER;  Surgeon: Waynetta Sandy, MD;  Location: [place];  Service: Vascular;  Laterality: Right;  . INSERTION OF DIALYSIS CATHETER Right 04/04/2017   Procedure: INSERTION OF DIALYSIS CATHETER;  Surgeon: Waynetta Sandy, MD;  Location: [place];  Service: Vascular;  Laterality: Right;  . LEFT HEART CATH AND CORONARY ANGIOGRAPHY N/A 05/12/2019   Procedure: LEFT HEART CATH AND CORONARY ANGIOGRAPHY;  Surgeon: Dixie Dials, MD;  Location: [place] CV LAB;  Service: Cardiovascular;  Laterality: N/A;  . LIGATION OF ARTERIOVENOUS  FISTULA Left 04/14/2016   Procedure: LIGATION OF ARTERIOVENOUS  FISTULA;  Surgeon: Angelia Mould, MD;  Location: [place];  Service: Vascular;  Laterality: Left;  . PATCH ANGIOPLASTY Left 06/19/2016   Procedure: PATCH ANGIOPLASTY LEFT ARTERIOVENOUS FISTULA;  Surgeon: Angelia Mould, MD;  Location: [place];  Service: Vascular;  Laterality: Left;  .  POLYPECTOMY  09/04/2017   Procedure: POLYPECTOMY;  Surgeon: Ronnette Juniper, MD;  Location: [facility] ENDOSCOPY;  Service: Gastroenterology;;  . REVISON OF ARTERIOVENOUS FISTULA Left 06/19/2016   Procedure: REVISION SUPERFICIALIZATION OF BRACHIOCEPHALIC ARTERIOVENOUS FISTULA;  Surgeon: Angelia Mould, MD;  Location: [facility] OR;  Service: Vascular;  Laterality: Left;   Family History  Problem Relation Age of Onset  . Hypertension Mother   . Thyroid disease Mother   . Coronary artery disease Father   . Hypertension Father   . Diabetes Father   . Colon cancer Maternal Aunt   . Esophageal cancer Maternal Uncle   . Inflammatory bowel disease Neg Hx   . Liver disease Neg Hx   . Pancreatic cancer Neg Hx   . Rectal cancer Neg Hx   . Stomach cancer Neg Hx    Social History:  reports that she has never smoked. She has never used smokeless tobacco. She reports that she does not drink alcohol and does not use drugs.  ROS: As per HPI otherwise negative.   Physical Exam: Vitals:   02/06/20 1747 02/06/20 1945 02/06/20 2300 02/07/20 0604  BP: (!) 180/88 (!) 186/84 (!) 180/97 (!) 174/91  Pulse:  89 90 95  Resp:  $Remo'19 20 20  'nDCse$ Temp:  99.3 F (37.4 C) 98.9 F (37.2 C) 98.6 F (37 C)  TempSrc:  Oral Oral Oral  SpO2:   97% 98%  Weight:      Height:         General: Well developed, well nourished, in no acute distress. Head: Normocephalic, atraumatic, sclera non-icteric, mucus membranes are moist. Neck: JVD not elevated. Lungs: Clear bilaterally to auscultation without wheezes, rales, or rhonchi. Breathing is unlabored. Heart: RRR with normal S1, S2. No murmurs, rubs, or gallops appreciated. Abdomen: Soft, non-distended with normoactive bowel sounds.  Lower extremities: Trace pedal edema bilaterally Neuro: Alert and oriented X 3. Moves all extremities spontaneously. Psych:  Responds to questions appropriately with a normal affect. Dialysis Access: R IJ TDC, PD cath in lower abdomen  Allergies   Allergen Reactions  . Nsaids Other (See Comments)    Cannot take due to Kidney disease/Kidney function   . Tolmetin Other (See Comments)    Cannot take due to Kidney Disease   Prior to Admission medications   Medication Sig Start Date End Date Taking? Authorizing Provider  allopurinol (ZYLOPRIM) 100 MG tablet Take 100 mg by mouth at bedtime.    Yes [provider]  ALPRAZolam Duanne Moron) 1 MG tablet Take 1 mg by mouth 2 (two) times daily as needed for anxiety.  03/19/17  Yes [provider]  atorvastatin (LIPITOR) 40 MG tablet Take 1 tablet (40 mg total) by mouth daily at 6 PM. 05/13/19  Yes Dixie Dials, MD  calcitRIOL (ROCALTROL) 0.5 MCG capsule Take 1 capsule (0.5 mcg total) by mouth daily with breakfast. Patient taking differently: Take 0.5 mcg by mouth 2 (two) times daily. 06/04/17  Yes Bonnielee Haff, MD  ferric citrate (AURYXIA) 1 GM 210 MG(Fe) tablet Take 3 tablets (630 mg total) by mouth 3 (three) times daily with meals. 04/28/19  Yes Elodia Florence., MD  gabapentin (NEURONTIN) 300 MG capsule Take 300 mg by mouth 2 (two) times daily. 02/01/20  Yes [provider]  hydrALAZINE (APRESOLINE) 50 MG tablet Take 50 mg by mouth 2 (two) times daily. 10/06/19  Yes [provider]  hydrOXYzine (ATARAX/VISTARIL) 25 MG tablet Take 25 mg by mouth every 6 (six) hours as needed for itching. 08/16/19  Yes [provider]  multivitamin (RENA-VIT) TABS tablet Take 1 tablet by mouth at bedtime.    Yes [provider]  omeprazole (PRILOSEC) 40 MG capsule Take 40 mg by mouth 2 (two) times daily.   Yes [provider]  promethazine (PHENERGAN) 25 MG tablet Take 1 tablet (25 mg total) by mouth every 8 (eight) hours as needed for nausea or vomiting. 04/07/18  Yes Shirley, [place], DO   Current Facility-Administered Medications  Medication Dose Route Frequency Provider Last Rate Last Admin  . 0.9 %  sodium chloride infusion   Intravenous PRN  Little Ishikawa, MD 10 mL/hr at 02/06/20 2156 250 mL at 02/06/20 2156  . acetaminophen (TYLENOL) tablet 650 mg  650 mg Oral Q6H PRN Opyd, Ilene Qua, MD   650 mg at 02/06/20 2153   Or  . acetaminophen (TYLENOL) suppository 650 mg  650 mg Rectal Q6H PRN Opyd, Ilene Qua, MD      . Chlorhexidine Gluconate Cloth 2 % PADS 6 each  6 each Topical Daily Little Ishikawa, MD   6 each at 02/06/20 2000  . fentaNYL (SUBLIMAZE) injection 25-50 mcg  25-50 mcg Intravenous Q2H PRN Opyd, Ilene Qua, MD   50 mcg at 02/06/20 1527  . labetalol (NORMODYNE) injection 10 mg  10 mg Intravenous Q2H PRN Opyd, Ilene Qua,  MD   10 mg at 02/06/20 1949  . ondansetron (ZOFRAN) tablet 4 mg  4 mg Oral Q6H PRN Opyd, Ilene Qua, MD       Or  . ondansetron (ZOFRAN) injection 4 mg  4 mg Intravenous Q6H PRN Opyd, Ilene Qua, MD   4 mg at 02/06/20 1527  . piperacillin-tazobactam (ZOSYN) IVPB 2.25 g  2.25 g Intravenous Q8H von DohlenHildred Alamin B, RPH 100 mL/hr at 02/07/20 0641 2.25 g at 02/07/20 0641   Labs: Basic Metabolic Panel: Recent Labs  Lab 02/05/20 1850 02/06/20 0827 02/07/20 0702  NA 133* 133* 134*  K 3.4* 3.6 3.6  CL 91* 93* 91*  CO2 $Re'26 25 24  'Sev$ GLUCOSE 95 80 87  BUN 19 26* 34*  CREATININE 7.99* 9.08* 11.67*  CALCIUM 9.3 9.0 9.3   Liver Function Tests: Recent Labs  Lab 02/05/20 1850  AST 18  ALT 13  ALKPHOS 59  BILITOT 0.7  PROT 7.2  ALBUMIN 2.8*   CBC: Recent Labs  Lab 02/05/20 1850 02/06/20 0120 02/06/20 0835 02/06/20 1627 02/07/20 0223 02/07/20 0702  WBC 14.5*  --  13.8* 14.8* 13.5* 13.3*  NEUTROABS  --   --  10.8*  --   --   --   HGB 6.9*   < > 7.3* 8.0* 7.0* 7.1*  HCT 23.8*   < > 23.3* 24.1* 20.6* 21.9*  MCV 93.3  --  87.9 85.8 84.8 86.2  PLT 277  --  259 289 270 279   < > = values in this interval not displayed.   Cardiac Enzymes: No results for input(s): CKTOTAL, CKMB, CKMBINDEX, TROPONINI in the last 168 hours. CBG: No results for input(s): GLUCAP in the last 168 hours. Iron  Studies: No results for input(s): IRON, TIBC, TRANSFERRIN, FERRITIN in the last 72 hours. Studies/Results: CT Abdomen Pelvis W Contrast  Result Date: 02/06/2020 CLINICAL DATA:  Rectal bleeding and abdominal pain EXAM: CT ABDOMEN AND PELVIS WITH CONTRAST TECHNIQUE: Multidetector CT imaging of the abdomen and pelvis was performed using the standard protocol following bolus administration of intravenous contrast. CONTRAST:  126mL OMNIPAQUE IOHEXOL 300 MG/ML  SOLN COMPARISON:  04/04/2018 FINDINGS: Lower chest: No acute abnormality. Hepatobiliary: No focal liver abnormality is seen. No gallstones, gallbladder wall thickening, or biliary dilatation. Pancreas: Unremarkable. No pancreatic ductal dilatation or surrounding inflammatory changes. Spleen: Normal in size without focal abnormality. Adrenals/Urinary Tract: Adrenal glands are within normal limits bilaterally. Kidneys demonstrate a normal enhancement pattern with small cysts bilaterally. No renal calculi or obstructive changes are seen. The bladder is decompressed. Stomach/Bowel: Diverticular change of the colon is noted. Along the posterior wall of the sigmoid colon interposed between the sigmoid and the rectum there is a 3.3 x 2.3 cm fluid collection which was not present on the prior exam and likely represents a small pericolonic abscess. No other inflammatory changes are seen. The appendix is prominent (13 mm) with some inspissated barium within although no inflammatory changes are seen. Small bowel and stomach appear within normal limits. Vascular/Lymphatic: Aortic atherosclerosis. No enlarged abdominal or pelvic lymph nodes. Reproductive: Status post hysterectomy. No adnexal masses. Other: Peritoneal dialysis catheter is noted. No significant residual dialysate is seen. Small fat containing anterior abdominal wall hernias are noted stable from the prior study. No herniation of bowel loops is seen. Musculoskeletal: No acute or significant osseous findings.  IMPRESSION: Changes of diverticulosis with a fluid collection posterior to the sigmoid and anterior to the rectum as described suspicious for small pericolonic abscess likely  arising from the posterior wall of the sigmoid colon. Prominent appendix with inspissated barium although no inflammatory changes are seen. Stable chronic changes similar to that seen on previous exams. Electronically Signed   By: Inez Catalina M.D.   On: 02/06/2020 02:28    Dialysis Orders:  Center: [facility] on MWF. 180NRe, 4 hours, BFR 400/DFR 800, EDW 97.5kg, 2K/2Ca, UF profile 2, TDC mircera 244mcg q 2 weeks- last dose 02/05/20  Assessment/Plan: 1.  Pericolonic abscess: On zosyn. GI following.  2. Acute lower GI bleed: Secondary to #1 above. Received 1 unit PRBC. Hgb now 7.1. Recently received max dose of ESA. Transfuse PRN 3.  ESRD:  Usually PD patient, recently transitioned to HD due to feeling poorly/insufficient clearances with PD. She is agreeable to continue HD for now. Will plan dialysis today per regular MWF schedule 4. Hypokalemia: Mild and appears to be chronic. 4K bath with HD. Will need to flush PD cath weekly to maintain patency. 5.  Hypertension/volume: BP elevated, mild volume overloaded on exam. UF with HD today as tolerated. Continue home BP meds. Please avoid additional IVF as much as possible.  6.  Metabolic bone disease: Calcium slightly elevated. Not on VDRA. Resume binders once tolerating PO 7.  Nutrition:  Currently NPO  Anice Paganini, PA-C 02/07/2020, 8:56 AM  [facility] Pager: [phone]  I have seen and examined this patient and agree with plan and assessment in the above note with renal recommendations/intervention highlighted.  Will continue with HD on MWF schedule while she remains and inpatient.  Governor Rooks Monike Bragdon,MD 02/07/2020 2:46 PM

## 2020-02-07 NOTE — Progress Notes (Addendum)
[facility] Progress Note  CC:  Pericolonic abscess and rectal bleeding  Subjective:  No longer having blooy or dark stools.  2 stools today were brown. Some nausea but she is hungry and has been only allowed ice chips.  Objective:  Vital signs in last 24 hours: Temp:  [98.3 F (36.8 C)-99.3 F (37.4 C)] 98.3 F (36.8 C) (02/02 1259) Pulse Rate:  [86-95] 86 (02/02 1259) Resp:  [19-20] 20 (02/02 1259) BP: (174-207)/(76-102) 181/91 (02/02 1434) SpO2:  [96 %-98 %] 98 % (02/02 0604) Weight:  [105.1 kg] 105.1 kg (02/01 1621) Last BM Date: 02/07/20 General:   Alert,  Well-developed,    in NAD Heart:  Regular rate and rhythm; no murmurs Pulm: Abdomen: Obese, soft, nontender.  PD catheter over on the left abdomen.  Active bowel sounds. Extremities:  Without edema. Neurologic:  Alert and  oriented x4;  grossly normal neurologically.  Repeated flickering and rolling of eyeballs, UE tremors.   Psych:  Alert and cooperative. Normal mood and affect.  Intake/Output from previous day: 02/01 0701 - 02/02 0700 In: 44.3 [IV Piggyback:44.3] Out: -   Lab Results: Recent Labs    02/06/20 1627 02/07/20 0223 02/07/20 0702  WBC 14.8* 13.5* 13.3*  HGB 8.0* 7.0* 7.1*  HCT 24.1* 20.6* 21.9*  PLT 289 270 279   BMET Recent Labs    02/05/20 1850 02/06/20 0827 02/07/20 0702  NA 133* 133* 134*  K 3.4* 3.6 3.6  CL 91* 93* 91*  CO2 $Re'26 25 24  'Teh$ GLUCOSE 95 80 87  BUN 19 26* 34*  CREATININE 7.99* 9.08* 11.67*  CALCIUM 9.3 9.0 9.3   LFT Recent Labs    02/05/20 1850  PROT 7.2  ALBUMIN 2.8*  AST 18  ALT 13  ALKPHOS 59  BILITOT 0.7   CT Abdomen Pelvis W Contrast  Result Date: 02/06/2020 CLINICAL DATA:  Rectal bleeding and abdominal pain EXAM: CT ABDOMEN AND PELVIS WITH CONTRAST TECHNIQUE: Multidetector CT imaging of the abdomen and pelvis was performed using the standard protocol following bolus administration of intravenous contrast. CONTRAST:  148mL OMNIPAQUE  IOHEXOL 300 MG/ML  SOLN COMPARISON:  04/04/2018 FINDINGS: Lower chest: No acute abnormality. Hepatobiliary: No focal liver abnormality is seen. No gallstones, gallbladder wall thickening, or biliary dilatation. Pancreas: Unremarkable. No pancreatic ductal dilatation or surrounding inflammatory changes. Spleen: Normal in size without focal abnormality. Adrenals/Urinary Tract: Adrenal glands are within normal limits bilaterally. Kidneys demonstrate a normal enhancement pattern with small cysts bilaterally. No renal calculi or obstructive changes are seen. The bladder is decompressed. Stomach/Bowel: Diverticular change of the colon is noted. Along the posterior wall of the sigmoid colon interposed between the sigmoid and the rectum there is a 3.3 x 2.3 cm fluid collection which was not present on the prior exam and likely represents a small pericolonic abscess. No other inflammatory changes are seen. The appendix is prominent (13 mm) with some inspissated barium within although no inflammatory changes are seen. Small bowel and stomach appear within normal limits. Vascular/Lymphatic: Aortic atherosclerosis. No enlarged abdominal or pelvic lymph nodes. Reproductive: Status post hysterectomy. No adnexal masses. Other: Peritoneal dialysis catheter is noted. No significant residual dialysate is seen. Small fat containing anterior abdominal wall hernias are noted stable from the prior study. No herniation of bowel loops is seen. Musculoskeletal: No acute or significant osseous findings. IMPRESSION: Changes of diverticulosis with a fluid collection posterior to the sigmoid and anterior to the rectum as described suspicious for small pericolonic abscess likely  arising from the posterior wall of the sigmoid colon. Prominent appendix with inspissated barium although no inflammatory changes are seen. Stable chronic changes similar to that seen on previous exams. Electronically Signed   By: Inez Catalina M.D.   On: 02/06/2020 02:28     Scheduled Meds: . Chlorhexidine Gluconate Cloth  6 each Topical Daily  . [START ON 02/08/2020] influenza vac split quadrivalent PF  0.5 mL Intramuscular Tomorrow-1000   Continuous Infusions: . sodium chloride 10 mL/hr at 02/07/20 1511  . piperacillin-tazobactam (ZOSYN)  IV Stopped (02/07/20 1223)   PRN Meds:.sodium chloride, acetaminophen **OR** acetaminophen, fentaNYL (SUBLIMAZE) injection, labetalol, ondansetron **OR** ondansetron (ZOFRAN) IV   Assessment / Plan: *   Sigmoid diverticulitis with pericolonic abscess between the rectum and sigmoid colon.    Multiple previous endoscopic/colonoscopic evaluations, VCE with findings of non-bleeding distal small bowel AVMs, nonbleeding gastric ulcers, erosive gastritis, duodenitis, colonic diverticulosis, colon polyps. Surgery following.  Abscess not amenable to drainage.  Continues on Zosyn Will allow clear liquids.  *    Rectal bleeding.  Resolved.  No plans at present for colonoscopy. As a precaution will start Protonix 40 mg daily  *    Acute on chronic anemia, acute likely due to GI blood loss:   Hgb 6.5 >> 1 PRBC >> 7.1.   ferric citrate TID at home.   Recheck Hgb hematocrit this evening and CBC in the morning.  *    ESRD was on peritoneal dialysis but more recently on MWF hemodialysis.  *    Ocular fasciculations and tremor. Patient says she was referred to a neurologist but she either canceled or never made an appointment.  She has not had any neurologic assessment for this.  Wonder if she could have an inpatient neuro eval?   LOS: 1 day   Laban Emperor. Zehr  02/07/2020, 2:37 PM     Attending 51 Attestation   I have taken an interval history, reviewed the chart and examined the patient.   Patient is not having further bleeding at this time.  If patient has recurrence then we will consider endoscopic evaluation while inpatient otherwise I would recommend a 4 to 6-week period of time prior to be looking within the  colon as well as repeat imaging to ensure that the abscess is healed.  I suspect this was diverticulitis related.  Appreciate surgery evaluation with consideration for repeat cross-sectional imaging in a couple of days.  I agree with the Advanced Practitioner's note, impression, and recommendations with updates and my documentation above.   Justice Britain, MD [facility] Advanced Endoscopy Office # [phone]

## 2020-02-08 DIAGNOSIS — R933 Abnormal findings on diagnostic imaging of other parts of digestive tract: Secondary | ICD-10-CM

## 2020-02-08 DIAGNOSIS — R569 Unspecified convulsions: Secondary | ICD-10-CM | POA: Diagnosis not present

## 2020-02-08 DIAGNOSIS — K630 Abscess of intestine: Secondary | ICD-10-CM | POA: Diagnosis not present

## 2020-02-08 LAB — BASIC METABOLIC PANEL
Anion gap: 16 — ABNORMAL HIGH (ref 5–15)
BUN: 15 mg/dL (ref 6–20)
CO2: 24 mmol/L (ref 22–32)
Calcium: 8.8 mg/dL — ABNORMAL LOW (ref 8.9–10.3)
Chloride: 95 mmol/L — ABNORMAL LOW (ref 98–111)
Creatinine, Ser: 6.8 mg/dL — ABNORMAL HIGH (ref 0.44–1.00)
GFR, Estimated: 7 mL/min — ABNORMAL LOW (ref >60)
Glucose, Bld: 94 mg/dL (ref 70–99)
Potassium: 3.8 mmol/L (ref 3.5–5.1)
Sodium: 135 mmol/L (ref 135–145)

## 2020-02-08 LAB — MAGNESIUM: Magnesium: 2 mg/dL (ref 1.7–2.4)

## 2020-02-08 LAB — CBC
HCT: 23.2 % — ABNORMAL LOW (ref 36.0–46.0)
Hemoglobin: 7.5 g/dL — ABNORMAL LOW (ref 12.0–15.0)
MCH: 28.4 pg (ref 26.0–34.0)
MCHC: 32.3 g/dL (ref 30.0–36.0)
MCV: 87.9 fL (ref 80.0–100.0)
Platelets: 298 10*3/uL (ref 150–400)
RBC: 2.64 MIL/uL — ABNORMAL LOW (ref 3.87–5.11)
RDW: 14.7 % (ref 11.5–15.5)
WBC: 13.2 10*3/uL — ABNORMAL HIGH (ref 4.0–10.5)
nRBC: 0 % (ref 0.0–0.2)

## 2020-02-08 LAB — IRON AND TIBC
Iron: 47 ug/dL (ref 28–170)
Saturation Ratios: 28 % (ref 10.4–31.8)
TIBC: 171 ug/dL — ABNORMAL LOW (ref 250–450)
UIBC: 124 ug/dL

## 2020-02-08 LAB — VITAMIN B12: Vitamin B-12: 6835 pg/mL — ABNORMAL HIGH (ref 180–914)

## 2020-02-08 MED ORDER — HYDRALAZINE HCL 50 MG PO TABS
50.0000 mg | ORAL_TABLET | Freq: Two times a day (BID) | ORAL | Status: DC
  Administered 2020-02-08 – 2020-02-11 (×8): 50 mg via ORAL
  Filled 2020-02-08 (×9): qty 1

## 2020-02-08 MED ORDER — LORAZEPAM 2 MG/ML IJ SOLN
1.0000 mg | INTRAMUSCULAR | Status: DC | PRN
Start: 2020-02-08 — End: 2020-02-16
  Administered 2020-02-09: 1 mg via INTRAVENOUS
  Filled 2020-02-08: qty 1

## 2020-02-08 MED ORDER — GABAPENTIN 300 MG PO CAPS
300.0000 mg | ORAL_CAPSULE | Freq: Two times a day (BID) | ORAL | Status: DC
  Administered 2020-02-08 – 2020-02-16 (×17): 300 mg via ORAL
  Filled 2020-02-08 (×18): qty 1

## 2020-02-08 MED ORDER — CHLORHEXIDINE GLUCONATE CLOTH 2 % EX PADS
6.0000 | MEDICATED_PAD | Freq: Every day | CUTANEOUS | Status: DC
  Administered 2020-02-10: 6 via TOPICAL

## 2020-02-08 MED ORDER — TRAMADOL HCL 50 MG PO TABS
50.0000 mg | ORAL_TABLET | Freq: Four times a day (QID) | ORAL | Status: DC | PRN
  Administered 2020-02-08 – 2020-02-13 (×9): 50 mg via ORAL
  Filled 2020-02-08 (×9): qty 1

## 2020-02-08 MED ORDER — ALPRAZOLAM 0.5 MG PO TABS
1.0000 mg | ORAL_TABLET | Freq: Two times a day (BID) | ORAL | Status: DC | PRN
  Administered 2020-02-08 – 2020-02-15 (×7): 1 mg via ORAL
  Filled 2020-02-08 (×7): qty 2

## 2020-02-08 MED ORDER — HYDROXYZINE HCL 25 MG PO TABS
25.0000 mg | ORAL_TABLET | Freq: Four times a day (QID) | ORAL | Status: DC | PRN
  Administered 2020-02-08 – 2020-02-14 (×9): 25 mg via ORAL
  Filled 2020-02-08 (×10): qty 1

## 2020-02-08 MED ORDER — HEPARIN SODIUM (PORCINE) 1000 UNIT/ML IJ SOLN
INTRAMUSCULAR | Status: AC
  Filled 2020-02-08: qty 4

## 2020-02-08 NOTE — Progress Notes (Signed)
PROGRESS NOTE    Patricia [name]  HMC:[id number] DOB: [birth year] DOA: 02/05/2020 PCP: Doyle Askew, PA-C   Brief Narrative:  Patricia [name] is a 45 y.o. female with medical history significant for ESRD on hemodialysis MWF, hypertension, anxiety, and history of recurrent GI bleeding, now presenting to the emergency department for evaluation of abdominal pain and rectal bleeding.  Patient reports 1 day of pain in the lower abdomen, fatigue, exertional dyspnea, and rectal bleeding.  She describes having an urge to defecate and passing bright red blood and clots.  She has had several episodes of this bleeding.  The pain has been constant for the past day, was worse after she ate something, and so she has not eaten anything since yesterday.  She denies similar symptoms previously and notes that she had dark stool with her previous GI bleeds.  Patient has had multiple endoscopic evaluations with GI revealing multiple issues including distal small bowel AVMs, nonbleeding gastric ulcers, erosive gastritis, duodenitis, diverticulosis and polyps. Upon arrival to the ED fecal occult blood testing was found to be positive.  CT abdomen shows pericolonic abscess.  Surgery and GI contacted by ED physician for further evaluation and treatment, hospitalist group called for admission.  Per imaging and evaluation with both surgery and GI patient was nonoperative, unfortunately abdominal pericolic abscess also unamenable to IR drainage.  Questionable GI bleed, acute on what appears to be a chronic recurrent issue, appears to be resolving.  No plan for colonoscopy at this point.  Patient slowly improving with IV antibiotics and supportive care.  Continue dialysis Monday Wednesday Friday per nephrology.  Assessment & Plan:   Principal Problem:   Pericolonic abscess Active Problems:   Hypertension   Symptomatic anemia   GI bleed   Acute on chronic blood loss anemia   Anemia   ESRD on  hemodialysis (HCC)   Sepsis secondary to pericolonic abscess, POA  - 3.3 x 2.3 cm pericolonic abscess on CT  - Abscess currently not amenable to drainage, appreciate GI and surgery evaluation and expertise - Continue conservative management currently with IV Zosyn, bowel rest IV fluids and symptom control.  Likely advance diet per GI to clear liquids as tolerated  Lower GI bleeding likely secondary to above with acute symptomatic blood loss anemia on chronic anemia of chronic disease, POA   - Appears to be recurrent given patient's history of endoscopy as above - GI following, appreciate insight recommendations, continue conservative management transfuse if hemoglobin less than 7 or symptomatic. -Defer to GI for possible endoscopy, brown stool noted today -bleeding may be resolving  ESRD MWF - Nephrology following, appreciate insight and recommendations -continue Monday Wednesday Friday schedule  Eye flutter/twitch  -Unclear etiology, assumed to be chronic given patient did not complain of this at admission but apparently is subacute over the past few weeks; was to be evaluated outpatient with neurology in the next few weeks -Have asked neurology to evaluate patient here, likely no acute need for treatment or imaging but defer to their expertise, appreciate their insight and recommendations Hypertension  - Labetalol as needed for now     DVT prophylaxis: SCDs Code Status: Full  Family Communication: None  Status is: Inpatient  Dispo: The patient is from: Home              Anticipated d/c is to: Home              Anticipated d/c date is: > 72 hours  Patient currently not medically stable for discharge  Consultants:   GI, general surgery, nephrology  Procedures:   None  Antimicrobials:  Zosyn, 02/05/2020, ongoing  Subjective: No acute issues or events overnight, patient reports ongoing watery stool now, diet advancing which she is more than happy about  otherwise denies nausea vomiting headache fever chills or abdominal pain.  Objective: Vitals:   02/08/20 0218 02/08/20 0230 02/08/20 0246 02/08/20 0315  BP: (!) 169/82 (!) 173/93 (!) 173/89 (!) 171/88  Pulse:      Resp: 18   18  Temp:   98.2 F (36.8 C) 98.5 F (36.9 C)  TempSrc:   Oral Oral  SpO2:      Weight:    101.6 kg  Height:        Intake/Output Summary (Last 24 hours) at 02/08/2020 0813 Last data filed at 02/08/2020 0246 Gross per 24 hour  Intake 232.55 ml  Output 2500 ml  Net -2267.45 ml   Filed Weights   02/06/20 1621 02/07/20 2357 02/08/20 0315  Weight: 105.1 kg 105.6 kg 101.6 kg    Examination:  General exam: Appears calm and comfortable  Respiratory system: Clear to auscultation. Respiratory effort normal. Cardiovascular system: S1 & S2 heard, RRR. No JVD, murmurs, rubs, gallops or clicks. No pedal edema. Gastrointestinal system: Abdomen is nondistended, soft and nontender. No organomegaly or masses felt. Normal bowel sounds heard. Central nervous system: Alert and oriented. No focal neurological deficits. Extremities: Symmetric 5 x 5 power. Skin: No rashes, lesions or ulcers Psychiatry: Judgement and insight appear normal. Mood & affect appropriate.     Data Reviewed: I have personally reviewed following labs and imaging studies  CBC: Recent Labs  Lab 02/06/20 0835 02/06/20 1627 02/07/20 0223 02/07/20 0702 02/07/20 1834 02/08/20 0454  WBC 13.8* 14.8* 13.5* 13.3*  --  13.2*  NEUTROABS 10.8*  --   --   --   --   --   HGB 7.3* 8.0* 7.0* 7.1* 7.1* 7.5*  HCT 23.3* 24.1* 20.6* 21.9* 20.5* 23.2*  MCV 87.9 85.8 84.8 86.2  --  87.9  PLT 259 289 270 279  --  638   Basic Metabolic Panel: Recent Labs  Lab 02/05/20 1850 02/06/20 0827 02/07/20 0702 02/08/20 0454  NA 133* 133* 134* 135  K 3.4* 3.6 3.6 3.8  CL 91* 93* 91* 95*  CO2 $Re'26 25 24 24  'yDF$ GLUCOSE 95 80 87 94  BUN 19 26* 34* 15  CREATININE 7.99* 9.08* 11.67* 6.80*  CALCIUM 9.3 9.0 9.3 8.8*    GFR: Estimated Creatinine Clearance: 12.7 mL/min (A) (by C-G formula based on SCr of 6.8 mg/dL (H)). Liver Function Tests: Recent Labs  Lab 02/05/20 1850  AST 18  ALT 13  ALKPHOS 59  BILITOT 0.7  PROT 7.2  ALBUMIN 2.8*   No results for input(s): LIPASE, AMYLASE in the last 168 hours. No results for input(s): AMMONIA in the last 168 hours. Coagulation Profile: No results for input(s): INR, PROTIME in the last 168 hours. Cardiac Enzymes: No results for input(s): CKTOTAL, CKMB, CKMBINDEX, TROPONINI in the last 168 hours. BNP (last 3 results) No results for input(s): PROBNP in the last 8760 hours. HbA1C: No results for input(s): HGBA1C in the last 72 hours. CBG: No results for input(s): GLUCAP in the last 168 hours. Lipid Profile: No results for input(s): CHOL, HDL, LDLCALC, TRIG, CHOLHDL, LDLDIRECT in the last 72 hours. Thyroid Function Tests: No results for input(s): TSH, T4TOTAL, FREET4, T3FREE, THYROIDAB in the last 72  hours. Anemia Panel: No results for input(s): VITAMINB12, FOLATE, FERRITIN, TIBC, IRON, RETICCTPCT in the last 72 hours. Sepsis Labs: No results for input(s): PROCALCITON, LATICACIDVEN in the last 168 hours.  Recent Results (from the past 240 hour(s))  SARS Coronavirus 2 by RT PCR (hospital order, performed in [facility] hospital lab) Nasopharyngeal Nasopharyngeal Swab     Status: None   Collection Time: 02/06/20  1:29 AM   Specimen: Nasopharyngeal Swab  Result Value Ref Range Status   SARS Coronavirus 2 NEGATIVE NEGATIVE Final    Comment: (NOTE) SARS-CoV-2 target nucleic acids are NOT DETECTED.  The SARS-CoV-2 RNA is generally detectable in upper and lower respiratory specimens during the acute phase of infection. The lowest concentration of SARS-CoV-2 viral copies this assay can detect is 250 copies / mL. A negative result does not preclude SARS-CoV-2 infection and should not be used as the sole basis for treatment or other patient management  decisions.  A negative result may occur with improper specimen collection / handling, submission of specimen other than nasopharyngeal swab, presence of viral mutation(s) within the areas targeted by this assay, and inadequate number of viral copies (<250 copies / mL). A negative result must be combined with clinical observations, patient history, and epidemiological information.  Fact Sheet for Patients:   StrictlyIdeas.no  Fact Sheet for Healthcare Providers: BankingDealers.co.za  This test is not yet approved or  cleared by the [place] FDA and has been authorized for detection and/or diagnosis of SARS-CoV-2 by FDA under an Emergency Use Authorization (EUA).  This EUA will remain in effect (meaning this test can be used) for the duration of the COVID-19 declaration under Section 564(b)(1) of the Act, 21 U.S.C. section 360bbb-3(b)(1), unless the authorization is terminated or revoked sooner.  Performed at [facility], [place] [address], [place], [place] [address]   MRSA PCR Screening     Status: None   Collection Time: 02/06/20 11:05 PM   Specimen: Nasopharyngeal  Result Value Ref Range Status   MRSA by PCR NEGATIVE NEGATIVE Final    Comment:        The GeneXpert MRSA Assay (FDA approved for NASAL specimens only), is one component of a comprehensive MRSA colonization surveillance program. It is not intended to diagnose MRSA infection nor to guide or monitor treatment for MRSA infections. Performed at [facility], [place] [address] [place], [place] [address]      Radiology Studies: No results found. Scheduled Meds: . Chlorhexidine Gluconate Cloth  6 each Topical Daily  . heparin sodium (porcine)      . influenza vac split quadrivalent PF  0.5 mL Intramuscular Tomorrow-1000  . pantoprazole  40 mg Oral Q0600   Continuous Infusions: . sodium chloride 10 mL/hr at 02/07/20 1511  . piperacillin-tazobactam  (ZOSYN)  IV 2.25 g (02/08/20 0313)    LOS: 2 days   Time spent: 25 min  Little Ishikawa, DO [facility]  If 7PM-7AM, please contact night-coverage [email or web address]  02/08/2020, 8:13 AM

## 2020-02-08 NOTE — Consult Note (Addendum)
Neurology Consultation  CC: Eye flutter/twitch  History is obtained from: Patient/ and hospitalist  HPI: Patricia [name] is a 45 y.o. female PMHx end-stage renal disease on HD (MWF), hypertension, anxiety and decline GI bleeding presented to ED for abdominal pain and rectal bleeding. She had pain in lower abdomen, fatigue, exertional dyspnea and rectal bleeding. CT scan shows pericolonic abscess. She is also diagnosed with sepsis secondary to pericolonic abscess. Abscess currently not amenable to drainage and continued on conservative management with IV Zosyn, bowel rest IV fluids and symptom control. For her end-stage renal disease, nephrology is following her and she had hemodialysis yesterday with 2.5 L UF. She complains of consistent eye rolling and mouth jerking and "chewing on the left side of her tongue" that started a few months ago and has since progressed.  ROS: A complete ROS was performed and is negative except as noted in the HPI.   Past Medical History:  Diagnosis Date  . Acid reflux   . Anemia 04/2019   REQUIRING BLOOD TRANSFUSIONS  . Anxiety   . Arthritis   . Dyspnea   . Dysrhythmia    tachycardia  . ESRD (end stage renal disease) ([place])    TTHSAT- [place]  . GI bleeding 12/2017  . Gout   . Headache(784.0)    "related to chemo; sometimes weekly" (09/12/2013)  . High cholesterol   . History of blood transfusion "a couple"   "related to low counts"  . Hypertension   . Iron deficiency anemia    "get epogen shots q month" (02/20/2014)  . 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 Peritoneal dialysis status ([place])   . Stroke [facility]) 08/15/2017   Family History  Problem Relation Age of Onset  . Hypertension Mother   . Thyroid disease Mother   . Coronary artery disease Father   . Hypertension Father   . Diabetes Father   . Colon cancer Maternal Aunt   . Esophageal cancer Maternal Uncle   . Inflammatory  bowel disease Neg Hx   . Liver disease Neg Hx   . Pancreatic cancer Neg Hx   . Rectal cancer Neg Hx   . Stomach cancer Neg Hx    Social History:  reports that she has never smoked. She has never used smokeless tobacco. She reports that she does not drink alcohol and does not use drugs.   Prior to Admission medications   Medication Sig Start Date End Date Taking? Authorizing Provider  allopurinol (ZYLOPRIM) 100 MG tablet Take 100 mg by mouth at bedtime.    Yes [provider]  ALPRAZolam Duanne Moron) 1 MG tablet Take 1 mg by mouth 2 (two) times daily as needed for anxiety.  03/19/17  Yes [provider]  atorvastatin (LIPITOR) 40 MG tablet Take 1 tablet (40 mg total) by mouth daily at 6 PM. 05/13/19  Yes Dixie Dials, MD  calcitRIOL (ROCALTROL) 0.5 MCG capsule Take 1 capsule (0.5 mcg total) by mouth daily with breakfast. Patient taking differently: Take 0.5 mcg by mouth 2 (two) times daily. 06/04/17  Yes Bonnielee Haff, MD  ferric citrate (AURYXIA) 1 GM 210 MG(Fe) tablet Take 3 tablets (630 mg total) by mouth 3 (three) times daily with meals. 04/28/19  Yes Elodia Florence., MD  gabapentin (NEURONTIN) 300 MG capsule Take 300 mg by mouth 2 (two) times daily. 02/01/20  Yes [provider]  hydrALAZINE (APRESOLINE) 50 MG tablet Take  50 mg by mouth 2 (two) times daily. 10/06/19  Yes [provider]  hydrOXYzine (ATARAX/VISTARIL) 25 MG tablet Take 25 mg by mouth every 6 (six) hours as needed for itching. 08/16/19  Yes [provider]  multivitamin (RENA-VIT) TABS tablet Take 1 tablet by mouth at bedtime.    Yes [provider]  omeprazole (PRILOSEC) 40 MG capsule Take 40 mg by mouth 2 (two) times daily.   Yes [provider]  promethazine (PHENERGAN) 25 MG tablet Take 1 tablet (25 mg total) by mouth every 8 (eight) hours as needed for nausea or vomiting. 04/07/18  Yes Shirley, [place], DO   Exam: Current vital signs: BP 113/79   Pulse 94    Temp 99.5 F (37.5 C) (Oral)   Resp (!) 24   Ht $R'5\' 7"'MD$  (1.702 m)   Wt 101.6 kg   SpO2 95%   BMI 35.07 kg/m   Physical Exam  Constitutional: Appears well-developed and well-nourished.  Psych: Affect appropriate to situation, slightly anxious due to signs/symptoms Eyes: Normal conjunctiva, constant intermittent eye rolling into the back of her head HENT: No OP obstrucion Head: Normocephalic, atraumatic Cardiovascular: Extremities warm without edema Respiratory: Effort normal, non-labored breathing, symmetric chest rise with inspiration GI: Soft.  No distension.  Skin: WDI  Neuro: Mental Status: Patient is awake, alert, oriented to person, place, month, year, and situation.She is able to give a clear and coherent history. Speech is intact without dysarthria or aphasia. Comprehension, fluency, and naming intact. No signs of neglect noted. Cranial Nerves: II: Visual Fields are full. Pupils are equal, round, and reactive to light.  3 mm/brisk. III,IV, VI: EOMI with noticeable right eye lag; without ptosis. There is intermittent rapid eyelid fluttering with rapid eye deviation up and to the right. V: Face sensation symmetric to light touch VII: Face is symmetric resting and smiling VIII: Hearing is intact to voice X: Palate elevated symmetrically, with involuntary contractions of palate that correlates with involuntary eye fluttering and mouth movement/smacking. XI: Shoulder shrug is symmetric. XII: Tongue protrudes midline with fasciculations.  Motor: Tone is normal. Bulk is normal. 5/5 strength was present in all four extremities. Antigravity movement present in all extremities without pronator drift. Tremor with intention that is not present at rest.  Sensory: Sensation is symmetric to light touch in the arms and legs. Cerebellar: FNF and HKS are intact bilaterally; tremor that increases with intention, disappears with rest.  I have reviewed labs in epic and the pertinent results  are:  Lab Results  Component Value Date/Time   CHOL 199 05/11/2019 05:15 AM    Results for orders placed or performed during the hospital encounter of 02/05/20 (from the past 48 hour(s))  MRSA PCR Screening     Status: None   Collection Time: 02/06/20 11:05 PM   Specimen: Nasopharyngeal  Result Value Ref Range   MRSA by PCR NEGATIVE NEGATIVE    Comment:        The GeneXpert MRSA Assay (FDA approved for NASAL specimens only), is one component of a comprehensive MRSA colonization surveillance program. It is not intended to diagnose MRSA infection nor to guide or monitor treatment for MRSA infections. Performed at [facility], [place] [address] [place] [address]   CBC     Status: Abnormal   Collection Time: 02/07/20  2:23 AM  Result Value Ref Range   WBC 13.5 (H) 4.0 - 10.5 K/uL   RBC 2.43 (L) 3.87 - 5.11 MIL/uL   Hemoglobin  7.0 (L) 12.0 - 15.0 g/dL   HCT 20.6 (L) 36.0 - 46.0 %   MCV 84.8 80.0 - 100.0 fL   MCH 28.8 26.0 - 34.0 pg   MCHC 34.0 30.0 - 36.0 g/dL   RDW 15.0 11.5 - 15.5 %   Platelets 270 150 - 400 K/uL   nRBC 0.0 0.0 - 0.2 %    Comment: Performed at [facility] [address] [place], [place] [address]  Basic metabolic panel     Status: Abnormal   Collection Time: 02/07/20  7:02 AM  Result Value Ref Range   Sodium 134 (L) 135 - 145 mmol/L   Potassium 3.6 3.5 - 5.1 mmol/L   Chloride 91 (L) 98 - 111 mmol/L   CO2 24 22 - 32 mmol/L   Glucose, Bld 87 70 - 99 mg/dL    Comment: Glucose reference range applies only to samples taken after fasting for at least 8 hours.   BUN 34 (H) 6 - 20 mg/dL   Creatinine, Ser 11.67 (H) 0.44 - 1.00 mg/dL   Calcium 9.3 8.9 - 10.3 mg/dL   GFR, Estimated 4 (L) >60 mL/min    Comment: (NOTE) Calculated using the CKD-EPI Creatinine Equation (2021)    Anion gap 19 (H) 5 - 15    Comment: Performed at [facility] [address], [place], [place] [address]  CBC     Status: Abnormal   Collection Time:  02/07/20  7:02 AM  Result Value Ref Range   WBC 13.3 (H) 4.0 - 10.5 K/uL   RBC 2.54 (L) 3.87 - 5.11 MIL/uL   Hemoglobin 7.1 (L) 12.0 - 15.0 g/dL   HCT 21.9 (L) 36.0 - 46.0 %   MCV 86.2 80.0 - 100.0 fL   MCH 28.0 26.0 - 34.0 pg   MCHC 32.4 30.0 - 36.0 g/dL   RDW 14.8 11.5 - 15.5 %   Platelets 279 150 - 400 K/uL   nRBC 0.0 0.0 - 0.2 %    Comment: Performed at [facility], [place] [address], [place], [place] [address]  Hemoglobin and hematocrit, blood     Status: Abnormal   Collection Time: 02/07/20  6:34 PM  Result Value Ref Range   Hemoglobin 7.1 (L) 12.0 - 15.0 g/dL   HCT 20.5 (L) 36.0 - 46.0 %    Comment: Performed at [facility], [place] [address], [place], [place] [address]  Basic metabolic panel     Status: Abnormal   Collection Time: 02/08/20  4:54 AM  Result Value Ref Range   Sodium 135 135 - 145 mmol/L   Potassium 3.8 3.5 - 5.1 mmol/L   Chloride 95 (L) 98 - 111 mmol/L   CO2 24 22 - 32 mmol/L   Glucose, Bld 94 70 - 99 mg/dL    Comment: Glucose reference range applies only to samples taken after fasting for at least 8 hours.   BUN 15 6 - 20 mg/dL   Creatinine, Ser 6.80 (H) 0.44 - 1.00 mg/dL    Comment: DIALYSIS   Calcium 8.8 (L) 8.9 - 10.3 mg/dL   GFR, Estimated 7 (L) >60 mL/min    Comment: (NOTE) Calculated using the CKD-EPI Creatinine Equation (2021)    Anion gap 16 (H) 5 - 15    Comment: Performed at [facility] [address], [place], [place] [address]  CBC     Status: Abnormal   Collection Time: 02/08/20  4:54 AM  Result Value Ref Range   WBC 13.2 (H) 4.0 - 10.5 K/uL   RBC 2.64 (L) 3.87 - 5.11 MIL/uL   Hemoglobin 7.5 (L) 12.0 - 15.0 g/dL   HCT 23.2 (L) 36.0 - 46.0 %   MCV 87.9 80.0 - 100.0 fL   MCH 28.4 26.0 - 34.0 pg   MCHC 32.3 30.0 - 36.0 g/dL   RDW 14.7 11.5 - 15.5 %   Platelets 298 150 - 400 K/uL   nRBC 0.0 0.0 - 0.2 %    Comment: Performed at [facility], [place] [address], [place], [place] [address]   Impression: 45 year old  female with PMHx of ESRD on HD, hypertension, anxiety, recurrent GI bleeding admitted to hospital for sepsis due to pericolonic abscess, lower GI bleeding and acute symptomatic blood loss anemia on chronic anemia of chronic disease. Complains of rapid eyelid fluttering with associated rapid right upward eye deviation which has progressively worsened over the past two months which is now impairing her function and vision. Differential diagnosis includes Deficiency of B12/Mg/iron, atypical eyelid myoclonia without absence seizures, eye dystonia and less likely oculopalatal myoclonus, Meige syndrome.  Recommendations: - MRI brain with and without is recommended; per patient needs sedation for MRI for severe claustrophobia.  - Lorazepam $RemoveBef'1mg'[id number]$  q15 minutes for MRI. - Labs: B12, Mg, Fe - EEG - Ordered. - Based on the results will consider starting antiseizure medication. - Neurology will continue to follow  Lynnae Sandhoff, MD Page: [phone]

## 2020-02-08 NOTE — Progress Notes (Signed)
CC: Abdominal pain, fever, rectal bleeding  Subjective: Patient is lying in bed she still has pain left lower quadrant.  It does not seem to be improved.  She is not eating or drinking anything she is afraid it will make her sick.  She is also developed a new area of swelling and tenderness right labia.  Objective: Vital signs in last 24 hours: Temp:  [98.2 F (36.8 C)-99.5 F (37.5 C)] 99.5 F (37.5 C) (02/03 0935) Pulse Rate:  [86-94] 94 (02/02 1947) Resp:  [18-24] 24 (02/03 0935) BP: (164-198)/(71-108) 164/89 (02/03 0935) SpO2:  [95 %-99 %] 95 % (02/03 0935) Weight:  [101.6 kg-105.6 kg] 101.6 kg (02/03 0315) Last BM Date: 02/07/20 Nothing p.o. recorded 232 IV recorded 2500 postdialysis recorded  no BM recorded Afebrile vital signs are stable Creatinine 6.8, potassium 3.8 WBC 13.2 H/H 7.5/23.2 Platelets 298,000 CT scan 2/1: Changes of diverticulosis with fluid collection posterior to the sigmoid and anterior to the rectum suspicious for small pericolonic abscess most likely arising from the posterior wall of the sigmoid colon.  Intake/Output from previous day: 02/02 0701 - 02/03 0700 In: 232.6 [I.V.:82.6; IV Piggyback:150] Out: 2500  Intake/Output this shift: No intake/output data recorded.  General appearance: alert, cooperative, no distress and She looks like she feels terrible. Resp: clear to auscultation bilaterally GI: Tender in the left lower quadrant.  Positive bowel sounds, no BM Skin: She has a 1.5 cm raised area right labia that is new and feels fluctuant.  She denies any vaginal drainage.  Nothing was noted on the left side.  Lab Results:  Recent Labs    02/07/20 0702 02/07/20 1834 02/08/20 0454  WBC 13.3*  --  13.2*  HGB 7.1* 7.1* 7.5*  HCT 21.9* 20.5* 23.2*  PLT 279  --  298    BMET Recent Labs    02/07/20 0702 02/08/20 0454  NA 134* 135  K 3.6 3.8  CL 91* 95*  CO2 24 24  GLUCOSE 87 94  BUN 34* 15  CREATININE 11.67* 6.80*  CALCIUM  9.3 8.8*   PT/INR No results for input(s): LABPROT, INR in the last 72 hours.  Recent Labs  Lab 02/05/20 1850  AST 18  ALT 13  ALKPHOS 59  BILITOT 0.7  PROT 7.2  ALBUMIN 2.8*     Lipase     Component Value Date/Time   LIPASE 34 05/11/2019 0515     Medications: . Chlorhexidine Gluconate Cloth  6 each Topical Daily  . Chlorhexidine Gluconate Cloth  6 each Topical Q0600  . gabapentin  300 mg Oral BID  . heparin sodium (porcine)      . hydrALAZINE  50 mg Oral BID  . influenza vac split quadrivalent PF  0.5 mL Intramuscular Tomorrow-1000  . pantoprazole  40 mg Oral Q0600   . sodium chloride 10 mL/hr at 02/07/20 1511  . piperacillin-tazobactam (ZOSYN)  IV 2.25 g (02/08/20 0313)    Assessment/Plan End-stage renal disease (PD >> HD MWF)  GI bleed -? Diverticular related Hypertension Acute on chronic anemia Possible Right labial abscess  Pericolonic abscess between the rectum and sigmoid -WBC 13.8>> 14.8>> 13.3>> 13.2  FEN: Clear liquid ID: Zosyn 2/1 >> day 3 DVT: Heparin Follow-up: TBD  Plan: We will leave her on clear liquids she is not taking anything at this point anyway.  Continue IV antibiotics.  I will order some warm compresses on the right labial, and also washing keep this area clean and dry.  Recheck  labs in the a.m. most likely obtain a repeat CT tomorrow.  Daily labs ordered.    LOS: 2 days    Dayelin Balducci 02/08/2020 Please see Amion

## 2020-02-08 NOTE — Progress Notes (Addendum)
[facility] Progress Note   Subjective:   Had HD late last night with 2.5L UF. Reports feeling tired and mildly SOB with exertion. No SOB at rest. No CP, abdominal pain, nausea or edema.   Objective Vitals:   02/08/20 0218 02/08/20 0230 02/08/20 0246 02/08/20 0315  BP: (!) 169/82 (!) 173/93 (!) 173/89 (!) 171/88  Pulse:      Resp: 18   18  Temp:   98.2 F (36.8 C) 98.5 F (36.9 C)  TempSrc:   Oral Oral  SpO2:      Weight:    101.6 kg  Height:       Physical Exam General: Well developed female, alert and in NAD Heart: RRR, no murmurs, rubs or gallops Lungs: CTA bilaterally without wheezing, rhonchi or rales Abdomen: Soft, non-tender, non-distended, +BS Extremities: No edema b/l lower extremities Dialysis Access:  [facility]  Additional Objective Labs: Basic Metabolic Panel: Recent Labs  Lab 02/06/20 0827 02/07/20 0702 02/08/20 0454  NA 133* 134* 135  K 3.6 3.6 3.8  CL 93* 91* 95*  CO2 $Re'25 24 24  'VIm$ GLUCOSE 80 87 94  BUN 26* 34* 15  CREATININE 9.08* 11.67* 6.80*  CALCIUM 9.0 9.3 8.8*   Liver Function Tests: Recent Labs  Lab 02/05/20 1850  AST 18  ALT 13  ALKPHOS 59  BILITOT 0.7  PROT 7.2  ALBUMIN 2.8*   CBC: Recent Labs  Lab 02/06/20 0835 02/06/20 1627 02/07/20 0223 02/07/20 0702 02/07/20 1834 02/08/20 0454  WBC 13.8* 14.8* 13.5* 13.3*  --  13.2*  NEUTROABS 10.8*  --   --   --   --   --   HGB 7.3* 8.0* 7.0* 7.1* 7.1* 7.5*  HCT 23.3* 24.1* 20.6* 21.9* 20.5* 23.2*  MCV 87.9 85.8 84.8 86.2  --  87.9  PLT 259 289 270 279  --  298   Blood Culture    Component Value Date/Time   SDES BLOOD RIGHT HAND 08/23/2018 2120   SPECREQUEST  08/23/2018 2120    BOTTLES DRAWN AEROBIC ONLY Blood Culture adequate volume   CULT  08/23/2018 2120    NO GROWTH 5 DAYS Performed at [facility], [address] [place], [place] [address]    REPTSTATUS 08/28/2018 FINAL 08/23/2018 2120   Medications: . sodium chloride 10 mL/hr at 02/07/20 1511  .  piperacillin-tazobactam (ZOSYN)  IV 2.25 g (02/08/20 0313)   . Chlorhexidine Gluconate Cloth  6 each Topical Daily  . gabapentin  300 mg Oral BID  . heparin sodium (porcine)      . hydrALAZINE  50 mg Oral BID  . influenza vac split quadrivalent PF  0.5 mL Intramuscular Tomorrow-1000  . pantoprazole  40 mg Oral Q0600    Dialysis Orders: Center: [facility] on MWF. 180NRe, 4 hours, BFR 400/DFR 800, EDW 97.5kg, 2K/2Ca, UF profile 2, TDC mircera 266mcg q 2 weeks- last dose 02/05/20  Assessment/Plan: 1.  Pericolonic abscess: On zosyn. No surgical intervention planned. GI following.  2. Acute lower GI bleed: Secondary to #1 above. Received 1 unit PRBC. Hgb now 7.5. Recently received max dose of ESA. Transfuse PRN 3.  ESRD:  Usually PD patient, recently transitioned to HD due to feeling poorly/insufficient clearances with PD. She is agreeable to continue HD for now. Will plan dialysis  per regular MWF schedule. Will need to flush PD cath weekly to maintain patency. 4. Hypokalemia: Resolved. Using high K+ baths with HD.   5.  Hypertension/volume: BP  elevated, no edema on exam. UF with HD as tolerated. Continue home BP meds. Please avoid additional IVF as much as possible.  6.  Metabolic bone disease: Calcium at goal. Not on VDRA. Resume binders once tolerating PO   Anice Paganini, PA-C 02/08/2020, 8:41 AM  [facility] Pager: [phone]  I have seen and examined this patient and agree with plan and assessment in the above note with renal recommendations/intervention highlighted. Surgery evaluating but no plan for surgical intervention.  Governor Rooks Jahaira Earnhart,MD 02/08/2020 12:54 PM

## 2020-02-08 NOTE — Progress Notes (Signed)
Gastroenterology Inpatient Follow-up Note   PATIENT IDENTIFICATION  Tapanga Kalah Pflum is a 45 y.o. female with a pmh significant for end-stage renal disease on hemodialysis, gout, hyperlipidemia, hypertension, arthritis, GERD, prior stroke, angioectasias/AVMs of the small bowel, diverticulosis.  The GI inpatient service was asked to evaluate in the setting of hematochezia and a potential intra-abdominal abscess. Hospital Day: 4  SUBJECTIVE  No further bleeding. No new transfusions. Hemoglobin trend remained stable. No new recommendations from surgery perspective. Appreciate neurology evaluation for patient's other symptoms occurring today.   OBJECTIVE  Scheduled Inpatient Medications:  . Chlorhexidine Gluconate Cloth  6 each Topical Daily  . Chlorhexidine Gluconate Cloth  6 each Topical Q0600  . gabapentin  300 mg Oral BID  . hydrALAZINE  50 mg Oral BID  . influenza vac split quadrivalent PF  0.5 mL Intramuscular Tomorrow-1000  . pantoprazole  40 mg Oral Q0600   Continuous Inpatient Infusions:  . sodium chloride 10 mL/hr at 02/07/20 1511  . piperacillin-tazobactam (ZOSYN)  IV 2.25 g (02/08/20 2040)   PRN Inpatient Medications: sodium chloride, acetaminophen **OR** acetaminophen, ALPRAZolam, hydrOXYzine, labetalol, LORazepam, ondansetron **OR** ondansetron (ZOFRAN) IV, traMADol   Physical Examination  Temp:  [98.2 F (36.8 C)-99.5 F (37.5 C)] 98.5 F (36.9 C) (02/03 2100) Pulse Rate:  [78] 78 (02/03 2100) Resp:  [18-24] 24 (02/03 0935) BP: (113-198)/(71-96) 155/75 (02/03 2100) SpO2:  [95 %-99 %] 95 % (02/03 0935) Weight:  [101.6 kg-105.6 kg] 101.6 kg (02/03 0315) Temp (24hrs), Avg:98.6 F (37 C), Min:98.2 F (36.8 C), Max:99.5 F (37.5 C)  Weight: 101.6 kg GEN: Chronically ill-appearing but nontoxic PSYCH: Cooperative, without pressured speech EYE: Conjunctivae pale-pink, sclerae anicteric ENT: Masked CV: Nontachycardic RESP: Decreased breath sounds at the  bases bilaterally GI: NABS, soft, mild tenderness to palpation in the left lower quadrant and midepigastrium, no rebound MSK/EXT: Lower extremity edema present SKIN: No jaundice NEURO:  Alert & Oriented x 3   Review of Data   Laboratory Studies   Recent Labs  Lab 02/08/20 0454 02/08/20 2105  NA 135  --   K 3.8  --   CL 95*  --   CO2 24  --   BUN 15  --   CREATININE 6.80*  --   GLUCOSE 94  --   CALCIUM 8.8*  --   MG  --  2.0   Recent Labs  Lab 02/05/20 1850  AST 18  ALT 13  ALKPHOS 59    Recent Labs  Lab 02/07/20 0223 02/07/20 0702 02/07/20 1834 02/08/20 0454  WBC 13.5* 13.3*  --  13.2*  HGB 7.0* 7.1*   < > 7.5*  HCT 20.6* 21.9*   < > 23.2*  PLT 270 279  --  298   < > = values in this interval not displayed.   No results for input(s): APTT, INR in the last 168 hours.  Imaging Studies  No results found.  GI Procedures and Studies  No new imaging studies to review   ASSESSMENT  Ms. Suleiman is a 45 y.o. female with a pmh significant for end-stage renal disease on hemodialysis, gout, hyperlipidemia, hypertension, arthritis, GERD, prior stroke, angioectasias/AVMs of the small bowel, diverticulosis.  The GI inpatient service was asked to evaluate in the setting of hematochezia and a potential intra-abdominal abscess.  From a GI perspective things are stable.  No overt evidence of recurrent bleeding at this time.  Patient being further worked up by medicine and neurology for other symptoms.  It is  reasonable to consider repeat cross-sectional imaging in approximately 3 to 4 weeks.  If the abscess is enlarging then percutaneous drainage may need to be performed.  If it is decreasing in size then maybe we can continue monitoring.  I defer antibiotic course to the surgical service.  I am willing to perform a colonoscopy or at minimum flexible sigmoidoscopy in approximately 6 weeks and the presumption that this intra-abdominal abscess was likely a result of a recent  diverticulitis.  We can ensure nothing else is being missed at that time.  Her last colonoscopy was done by the [facility] service during a hospitalization over 3 years ago.  The GI inpatient service will moved to standby at this time and when she is closer to discharge the inpatient service can be reached out to and follow-up can be arranged, if the patient wants to follow-up as she has not seen Sarita or myself in almost 2 years and if she wants to follow-up with [facility] then that is okay as well.   PLAN/RECOMMENDATIONS  Antibiotic course for intra-abdominal presumed abscess as per surgical service Recommend consideration of repeat cross-sectional imaging in approximately 3 to 4 weeks unless surgery feels sooner Follow-up with surgery and myself can be arranged Consideration of a colonoscopic evaluation via colonoscopy or flexible sigmoidoscopy in approximately 6 weeks is reasonable and that can be arranged if the patient wants to follow-up with GI in [place] versus GI at [facility] (patient preference and she is not sure what she wants at this time) [facility] service is signing off but if the patient wants to follow-up with [place] then please let us know as she gets closer to discharge No plan for endoscopic evaluation at this time unless recurrent hematochezia becomes an issue during hospitalization   GI will sign off.  Please page/call with questions or concerns.   Justice Britain, MD [facility] Office # [phone]

## 2020-02-09 ENCOUNTER — Inpatient Hospital Stay (HOSPITAL_COMMUNITY): Payer: Medicare Other

## 2020-02-09 ENCOUNTER — Telehealth: Payer: Self-pay

## 2020-02-09 DIAGNOSIS — K630 Abscess of intestine: Secondary | ICD-10-CM | POA: Diagnosis not present

## 2020-02-09 LAB — BASIC METABOLIC PANEL
Anion gap: 18 — ABNORMAL HIGH (ref 5–15)
BUN: 21 mg/dL — ABNORMAL HIGH (ref 6–20)
CO2: 22 mmol/L (ref 22–32)
Calcium: 9.7 mg/dL (ref 8.9–10.3)
Chloride: 98 mmol/L (ref 98–111)
Creatinine, Ser: 9.96 mg/dL — ABNORMAL HIGH (ref 0.44–1.00)
GFR, Estimated: 4 mL/min — ABNORMAL LOW (ref >60)
Glucose, Bld: 77 mg/dL (ref 70–99)
Potassium: 4 mmol/L (ref 3.5–5.1)
Sodium: 138 mmol/L (ref 135–145)

## 2020-02-09 LAB — CBC
HCT: 23.8 % — ABNORMAL LOW (ref 36.0–46.0)
Hemoglobin: 7.5 g/dL — ABNORMAL LOW (ref 12.0–15.0)
MCH: 27.9 pg (ref 26.0–34.0)
MCHC: 31.5 g/dL (ref 30.0–36.0)
MCV: 88.5 fL (ref 80.0–100.0)
Platelets: 341 10*3/uL (ref 150–400)
RBC: 2.69 MIL/uL — ABNORMAL LOW (ref 3.87–5.11)
RDW: 14.5 % (ref 11.5–15.5)
WBC: 10.8 10*3/uL — ABNORMAL HIGH (ref 4.0–10.5)
nRBC: 0.3 % — ABNORMAL HIGH (ref 0.0–0.2)

## 2020-02-09 MED ORDER — IOHEXOL 9 MG/ML PO SOLN
500.0000 mL | ORAL | Status: AC
  Administered 2020-02-09: 500 mL via ORAL

## 2020-02-09 MED ORDER — HEPARIN SODIUM (PORCINE) 1000 UNIT/ML IJ SOLN
INTRAMUSCULAR | Status: AC
  Administered 2020-02-09: 1000 [IU]
  Filled 2020-02-09: qty 4

## 2020-02-09 MED ORDER — GADOBUTROL 1 MMOL/ML IV SOLN
10.0000 mL | Freq: Once | INTRAVENOUS | Status: AC | PRN
  Administered 2020-02-09: 10 mL via INTRAVENOUS

## 2020-02-09 MED ORDER — IOHEXOL 300 MG/ML  SOLN
99.0000 mL | Freq: Once | INTRAMUSCULAR | Status: AC | PRN
  Administered 2020-02-09: 99 mL via INTRAVENOUS

## 2020-02-09 NOTE — Telephone Encounter (Signed)
-----   Message from Irving Copas., MD sent at 02/09/2020 12:33 AM EST ----- Regarding: Follow-up Maris Abascal, This patient needs follow-up with [place].  It is not clear how long she is going to be in the hospital still (I suspect a few more days) but the GI service is signing off.  Set up a follow-up in clinic in approximately 4 weeks with one of the APP's or myself.  We will hear from the medicine service when the patient is ready for discharge about potentially scheduling a repeat CT scan but we will wait until the patient is close to discharge so that we can know when to schedule it.  There is also a plan for potential colonoscopy versus flexible sigmoidoscopy in 6 weeks from now.  Thus, probably best to put this on your follow-up list for next week when hopefully the patient has been discharged so that the clinic follow-up and the endoscopic evaluation and the imaging evaluation can be timed appropriately. Thanks. GM

## 2020-02-09 NOTE — Progress Notes (Signed)
EEG in process, result pending.Marland Kitchen

## 2020-02-09 NOTE — Progress Notes (Signed)
PROGRESS NOTE    Patricia [name]  ZSM:[id number] DOB: 1975/12/22 DOA: 02/05/2020 PCP: Doyle Askew, PA-C   Brief Narrative:  Patricia [name] is a 45 y.o. female with medical history significant for ESRD on hemodialysis MWF, hypertension, anxiety, and history of recurrent GI bleeding, now presenting to the emergency department for evaluation of abdominal pain and rectal bleeding.  Patient reports 1 day of pain in the lower abdomen, fatigue, exertional dyspnea, and rectal bleeding.  She describes having an urge to defecate and passing bright red blood and clots.  She has had several episodes of this bleeding.  The pain has been constant for the past day, was worse after she ate something, and so she has not eaten anything since yesterday.  She denies similar symptoms previously and notes that she had dark stool with her previous GI bleeds.  Patient has had multiple endoscopic evaluations with GI revealing multiple issues including distal small bowel AVMs, nonbleeding gastric ulcers, erosive gastritis, duodenitis, diverticulosis and polyps. Upon arrival to the ED fecal occult blood testing was found to be positive.  CT abdomen shows pericolonic abscess.  Surgery and GI contacted by ED physician for further evaluation and treatment, hospitalist group called for admission.  Per imaging and evaluation with both surgery and GI patient was nonoperative, unfortunately abdominal pericolic abscess also unamenable to IR drainage.  Questionable GI bleed, acute on what appears to be a chronic recurrent issue, appears to be resolving.  No plan for colonoscopy at this point.  Patient slowly improving with IV antibiotics and supportive care.  Continue dialysis Monday Wednesday Friday per nephrology.  Assessment & Plan:   Principal Problem:   Pericolonic abscess Active Problems:   Hypertension   Symptomatic anemia   GI bleed   Acute on chronic blood loss anemia   Anemia   ESRD on  hemodialysis (HCC)   Sepsis secondary to pericolonic abscess, POA  - 3.3 x 2.3 cm pericolonic abscess on CT at intake - repeat CT with smaller 15x29mm abscess collection - Abscess currently not amenable to drainage, appreciate GI and surgery evaluation and expertise - Continue conservative management currently with IV Zosyn, bowel rest IV fluids and symptom control.  Likely advance diet per GI to clear liquids as tolerated  Lower GI bleeding likely secondary to above with acute symptomatic blood loss anemia on chronic anemia of chronic disease, POA   - Appears to be recurrent given patient's history of endoscopy as above - GI sign off - colonoscopy in 6 weeks; outpatient follow up in 3/4 weeks - reconsult if bleeding worsens/returns  ESRD MWF - Nephrology following, appreciate insight and recommendations -continue Monday Wednesday Friday schedule  Eye flutter/twitch  - Neurology following, appreciate insight and recommendations  - MRI with no acute intracranial abnormality; EEG pending; B12, iron panel unremarkable  Hypertension  - Labetalol as needed for now     DVT prophylaxis: SCDs Code Status: Full  Family Communication: None  Status is: Inpatient  Dispo: The patient is from: Home              Anticipated d/c is to: Home              Anticipated d/c date is: 48-72 hours              Patient currently not medically stable for discharge  Consultants:   GI, general surgery, nephrology  Procedures:   None  Antimicrobials:  Zosyn, 02/05/2020, ongoing  Subjective: No acute issues or events  overnight, patient reports ongoing watery stool now, diet advancing which she is more than happy about otherwise denies nausea vomiting headache fever chills or abdominal pain.  Objective: Vitals:   02/08/20 0935 02/08/20 1055 02/08/20 2100 02/09/20 0431  BP: (!) 164/89 113/79 (!) 155/75 (!) 168/89  Pulse:   78 82  Resp: (!) 24     Temp: 99.5 F (37.5 C)  98.5 F (36.9 C)  98.5 F (36.9 C)  TempSrc: Oral  Oral Oral  SpO2: 95%     Weight:      Height:        Intake/Output Summary (Last 24 hours) at 02/09/2020 0742 Last data filed at 02/08/2020 2129 Gross per 24 hour  Intake 120 ml  Output --  Net 120 ml   Filed Weights   02/06/20 1621 02/07/20 2357 02/08/20 0315  Weight: 105.1 kg 105.6 kg 101.6 kg    Examination:  General exam: Appears calm and comfortable  Respiratory system: Clear to auscultation. Respiratory effort normal. Cardiovascular system: S1 & S2 heard, RRR. No JVD, murmurs, rubs, gallops or clicks. No pedal edema. Gastrointestinal system: Abdomen is nondistended, soft and nontender. No organomegaly or masses felt. Normal bowel sounds heard. Central nervous system: Alert and oriented. No focal neurological deficits. Extremities: Symmetric 5 x 5 power. Skin: No rashes, lesions or ulcers Psychiatry: Judgement and insight appear normal. Mood & affect appropriate.   Data Reviewed: I have personally reviewed following labs and imaging studies  CBC: Recent Labs  Lab 02/06/20 0835 02/06/20 1627 02/07/20 0223 02/07/20 0702 02/07/20 1834 02/08/20 0454 02/09/20 0342  WBC 13.8* 14.8* 13.5* 13.3*  --  13.2* 10.8*  NEUTROABS 10.8*  --   --   --   --   --   --   HGB 7.3* 8.0* 7.0* 7.1* 7.1* 7.5* 7.5*  HCT 23.3* 24.1* 20.6* 21.9* 20.5* 23.2* 23.8*  MCV 87.9 85.8 84.8 86.2  --  87.9 88.5  PLT 259 289 270 279  --  298 546   Basic Metabolic Panel: Recent Labs  Lab 02/05/20 1850 02/06/20 0827 02/07/20 0702 02/08/20 0454 02/08/20 2105 02/09/20 0342  NA 133* 133* 134* 135  --  138  K 3.4* 3.6 3.6 3.8  --  4.0  CL 91* 93* 91* 95*  --  98  CO2 $Re'26 25 24 24  'rvk$ --  22  GLUCOSE 95 80 87 94  --  77  BUN 19 26* 34* 15  --  21*  CREATININE 7.99* 9.08* 11.67* 6.80*  --  9.96*  CALCIUM 9.3 9.0 9.3 8.8*  --  9.7  MG  --   --   --   --  2.0  --    GFR: Estimated Creatinine Clearance: 8.6 mL/min (A) (by C-G formula based on SCr of 9.96 mg/dL  (H)). Liver Function Tests: Recent Labs  Lab 02/05/20 1850  AST 18  ALT 13  ALKPHOS 59  BILITOT 0.7  PROT 7.2  ALBUMIN 2.8*   No results for input(s): LIPASE, AMYLASE in the last 168 hours. No results for input(s): AMMONIA in the last 168 hours. Coagulation Profile: No results for input(s): INR, PROTIME in the last 168 hours. Cardiac Enzymes: No results for input(s): CKTOTAL, CKMB, CKMBINDEX, TROPONINI in the last 168 hours. BNP (last 3 results) No results for input(s): PROBNP in the last 8760 hours. HbA1C: No results for input(s): HGBA1C in the last 72 hours. CBG: No results for input(s): GLUCAP in the last 168 hours. Lipid Profile: No  results for input(s): CHOL, HDL, LDLCALC, TRIG, CHOLHDL, LDLDIRECT in the last 72 hours. Thyroid Function Tests: No results for input(s): TSH, T4TOTAL, FREET4, T3FREE, THYROIDAB in the last 72 hours. Anemia Panel: Recent Labs    02/08/20 2105  VITAMINB12 6,835*  TIBC 171*  IRON 47   Sepsis Labs: No results for input(s): PROCALCITON, LATICACIDVEN in the last 168 hours.  Recent Results (from the past 240 hour(s))  SARS Coronavirus 2 by RT PCR (hospital order, performed in [facility] hospital lab) Nasopharyngeal Nasopharyngeal Swab     Status: None   Collection Time: 02/06/20  1:29 AM   Specimen: Nasopharyngeal Swab  Result Value Ref Range Status   SARS Coronavirus 2 NEGATIVE NEGATIVE Final    Comment: (NOTE) SARS-CoV-2 target nucleic acids are NOT DETECTED.  The SARS-CoV-2 RNA is generally detectable in upper and lower respiratory specimens during the acute phase of infection. The lowest concentration of SARS-CoV-2 viral copies this assay can detect is 250 copies / mL. A negative result does not preclude SARS-CoV-2 infection and should not be used as the sole basis for treatment or other patient management decisions.  A negative result may occur with improper specimen collection / handling, submission of specimen other than  nasopharyngeal swab, presence of viral mutation(s) within the areas targeted by this assay, and inadequate number of viral copies (<250 copies / mL). A negative result must be combined with clinical observations, patient history, and epidemiological information.  Fact Sheet for Patients:   StrictlyIdeas.no  Fact Sheet for Healthcare Providers: BankingDealers.co.za  This test is not yet approved or  cleared by the [place] FDA and has been authorized for detection and/or diagnosis of SARS-CoV-2 by FDA under an Emergency Use Authorization (EUA).  This EUA will remain in effect (meaning this test can be used) for the duration of the COVID-19 declaration under Section 564(b)(1) of the Act, 21 U.S.C. section 360bbb-3(b)(1), unless the authorization is terminated or revoked sooner.  Performed at [facility], [place] [address] [place], [place] [address]   MRSA PCR Screening     Status: None   Collection Time: 02/06/20 11:05 PM   Specimen: Nasopharyngeal  Result Value Ref Range Status   MRSA by PCR NEGATIVE NEGATIVE Final    Comment:        The GeneXpert MRSA Assay (FDA approved for NASAL specimens only), is one component of a comprehensive MRSA colonization surveillance program. It is not intended to diagnose MRSA infection nor to guide or monitor treatment for MRSA infections. Performed at [facility], [place] [address], [place], [place] [address]      Radiology Studies: No results found. Scheduled Meds: . Chlorhexidine Gluconate Cloth  6 each Topical Daily  . Chlorhexidine Gluconate Cloth  6 each Topical Q0600  . gabapentin  300 mg Oral BID  . hydrALAZINE  50 mg Oral BID  . influenza vac split quadrivalent PF  0.5 mL Intramuscular Tomorrow-1000  . pantoprazole  40 mg Oral Q0600   Continuous Infusions: . sodium chloride 10 mL/hr at 02/07/20 1511  . piperacillin-tazobactam (ZOSYN)  IV 2.25 g (02/09/20 0454)     LOS: 3 days   Time spent: 40 min  Little Ishikawa, DO [facility]  If 7PM-7AM, please contact night-coverage [email or web address]  02/09/2020, 7:42 AM

## 2020-02-09 NOTE — Progress Notes (Signed)
Spoke with nurse, patient at HD, will call us when she returns; will do EEG this afternoon as schedule permits.

## 2020-02-09 NOTE — Telephone Encounter (Signed)
Appt made for 03/13/20 at 150 pm with Dr Rush Landmark.  Will wait for further instruction on the need for CT scan and colon vs Flex.  The pt will be given the appt information at discharge and sent to My Chart.

## 2020-02-09 NOTE — Procedures (Signed)
I was present at this dialysis session. I have reviewed the session itself and made appropriate changes.   Vital signs in last 24 hours:  Temp:  [98.5 F (36.9 C)-99.5 F (37.5 C)] 98.8 F (37.1 C) (02/04 0753) Pulse Rate:  [78-94] 85 (02/04 0757) Resp:  [18-24] 18 (02/04 0753) BP: (113-199)/(75-114) 176/114 (02/04 0900) SpO2:  [95 %-98 %] 98 % (02/04 0753) Weight:  [104.9 kg] 104.9 kg (02/04 0748) Weight change:  Filed Weights   02/07/20 2357 02/08/20 0315 02/09/20 0748  Weight: 105.6 kg 101.6 kg 104.9 kg    Recent Labs  Lab 02/09/20 0342  NA 138  K 4.0  CL 98  CO2 22  GLUCOSE 77  BUN 21*  CREATININE 9.96*  CALCIUM 9.7    Recent Labs  Lab 02/06/20 0835 02/06/20 1627 02/07/20 0702 02/07/20 1834 02/08/20 0454 02/09/20 0342  WBC 13.8*   < > 13.3*  --  13.2* 10.8*  NEUTROABS 10.8*  --   --   --   --   --   HGB 7.3*   < > 7.1* 7.1* 7.5* 7.5*  HCT 23.3*   < > 21.9* 20.5* 23.2* 23.8*  MCV 87.9   < > 86.2  --  87.9 88.5  PLT 259   < > 279  --  298 341   < > = values in this interval not displayed.    Scheduled Meds: . Chlorhexidine Gluconate Cloth  6 each Topical Q0600  . gabapentin  300 mg Oral BID  . hydrALAZINE  50 mg Oral BID  . influenza vac split quadrivalent PF  0.5 mL Intramuscular Tomorrow-1000  . pantoprazole  40 mg Oral Q0600   Continuous Infusions: . sodium chloride 10 mL/hr at 02/07/20 1511  . piperacillin-tazobactam (ZOSYN)  IV 2.25 g (02/09/20 0454)   PRN Meds:.sodium chloride, acetaminophen **OR** acetaminophen, ALPRAZolam, hydrOXYzine, labetalol, LORazepam, ondansetron **OR** ondansetron (ZOFRAN) IV, traMADol     [facility], 4 hours, BFR 400/DFR 800, EDW 97.5kg, 2K/2Ca, UF profile 2, TDC mircera 227mcg q 2 weeks- last dose 02/05/20  Assessment/Plan: 1. Pericolonic abscess: On zosyn. No surgical intervention planned. GI following.  2. Acute lower GI bleed: Secondary to #1 above.  Received 1 unit PRBC. Hgb now 7.5. Recently received max dose of ESA. Transfuse PRN 3. ESRD:Usually PD patient, recently transitioned to HD due to feeling poorly/insufficient clearances with PD. She is agreeable to continue HD for now. Will plan dialysis  per regular MWF schedule. Will need to flush PD cath weekly to maintain patency. 4. Hypokalemia: Resolved. Using high K+ baths with HD.   5. Hypertension/volume:BP elevated, no edema on exam. UF with HD as tolerated. Continue home BP meds. Please avoid additional IVF as much as possible. 6. Metabolic bone disease:Calcium at goal. Not on VDRA. Resume binders once tolerating PO   Donetta Potts,  MD 02/09/2020, 9:30 AM

## 2020-02-09 NOTE — Progress Notes (Signed)
    CC: Abdominal pain, fever, rectal bleeding  Subjective: Seen during HD. She is somnolent but arouses to speak to me appropriately. C/o ongoing LLQ pain that is only slightly improved compared to on admission, worse with PO liquid intake. Endorses mild nausea without emesis and is having non-bloody liquid stools (last BM yesterday). States she has not received any warm compressed for right labial pain - has a history of a boil in this area.  Objective: Vital signs in last 24 hours: Temp:  [98.5 F (36.9 C)-99.5 F (37.5 C)] 98.8 F (37.1 C) (02/04 0753) Pulse Rate:  [78-94] 85 (02/04 0757) Resp:  [18-24] 18 (02/04 0753) BP: (113-199)/(75-114) 176/114 (02/04 0900) SpO2:  [95 %-98 %] 98 % (02/04 0753) Weight:  [104.9 kg] 104.9 kg (02/04 0748) Last BM Date: 02/08/20  CT scan 2/1: Changes of diverticulosis with fluid collection posterior to the sigmoid and anterior to the rectum suspicious for small pericolonic abscess most likely arising from the posterior wall of the sigmoid colon.  Intake/Output from previous day: 02/03 0701 - 02/04 0700 In: 120 [P.O.:120] Out: -  Intake/Output this shift: No intake/output data recorded.  General appearance: somnolent during HD, cooperative, no distress Resp: clear to auscultation bilaterally GI: Tender in the left lower quadrant.  Without guarding or peritnitis. Positive bowel sounds Skin: She has a 1.5 cm raised area right labia that feels fluctuant. Tender. Lab Results:  Recent Labs    02/08/20 0454 02/09/20 0342  WBC 13.2* 10.8*  HGB 7.5* 7.5*  HCT 23.2* 23.8*  PLT 298 341    BMET Recent Labs    02/08/20 0454 02/09/20 0342  NA 135 138  K 3.8 4.0  CL 95* 98  CO2 24 22  GLUCOSE 94 77  BUN 15 21*  CREATININE 6.80* 9.96*  CALCIUM 8.8* 9.7   PT/INR No results for input(s): LABPROT, INR in the last 72 hours.  Recent Labs  Lab 02/05/20 1850  AST 18  ALT 13  ALKPHOS 59  BILITOT 0.7  PROT 7.2  ALBUMIN 2.8*      Lipase     Component Value Date/Time   LIPASE 34 05/11/2019 0515     Medications: . Chlorhexidine Gluconate Cloth  6 each Topical Q0600  . gabapentin  300 mg Oral BID  . hydrALAZINE  50 mg Oral BID  . influenza vac split quadrivalent PF  0.5 mL Intramuscular Tomorrow-1000  . pantoprazole  40 mg Oral Q0600   . sodium chloride 10 mL/hr at 02/07/20 1511  . piperacillin-tazobactam (ZOSYN)  IV 2.25 g (02/09/20 0454)    Assessment/Plan End-stage renal disease (PD >> HD MWF)  GI bleed -? Diverticular related Hypertension Acute on chronic anemia Possible Right labial abscess  Pericolonic abscess between the rectum and sigmoid -WBC 13.8>> 14.8>> 13.3>> 13.2 >>10.8  FEN: Clear liquid ID: Zosyn 2/1 >> day 3 DVT: Heparin Follow-up: TBD  Plan: Repeat CT abdomen/pelvis today for follow up of known pelvic abscess with ongoing LLQ pain, nausea, poor PO intake. Would like to get IV contrast bu will discuss with renal first. Continue IV abx. Continue CLD for now.     LOS: 3 days    Jill Alexanders 02/09/2020 Please see Amion

## 2020-02-10 DIAGNOSIS — K630 Abscess of intestine: Secondary | ICD-10-CM | POA: Diagnosis not present

## 2020-02-10 DIAGNOSIS — R569 Unspecified convulsions: Secondary | ICD-10-CM | POA: Diagnosis not present

## 2020-02-10 LAB — CBC
HCT: 26.4 % — ABNORMAL LOW (ref 36.0–46.0)
Hemoglobin: 8.2 g/dL — ABNORMAL LOW (ref 12.0–15.0)
MCH: 27.4 pg (ref 26.0–34.0)
MCHC: 31.1 g/dL (ref 30.0–36.0)
MCV: 88.3 fL (ref 80.0–100.0)
Platelets: 357 10*3/uL (ref 150–400)
RBC: 2.99 MIL/uL — ABNORMAL LOW (ref 3.87–5.11)
RDW: 14.6 % (ref 11.5–15.5)
WBC: 11.8 10*3/uL — ABNORMAL HIGH (ref 4.0–10.5)
nRBC: 0.3 % — ABNORMAL HIGH (ref 0.0–0.2)

## 2020-02-10 LAB — BASIC METABOLIC PANEL
Anion gap: 17 — ABNORMAL HIGH (ref 5–15)
BUN: 13 mg/dL (ref 6–20)
CO2: 23 mmol/L (ref 22–32)
Calcium: 9.8 mg/dL (ref 8.9–10.3)
Chloride: 94 mmol/L — ABNORMAL LOW (ref 98–111)
Creatinine, Ser: 7.76 mg/dL — ABNORMAL HIGH (ref 0.44–1.00)
GFR, Estimated: 6 mL/min — ABNORMAL LOW (ref >60)
Glucose, Bld: 89 mg/dL (ref 70–99)
Potassium: 3.7 mmol/L (ref 3.5–5.1)
Sodium: 134 mmol/L — ABNORMAL LOW (ref 135–145)

## 2020-02-10 MED ORDER — CHLORHEXIDINE GLUCONATE CLOTH 2 % EX PADS
6.0000 | MEDICATED_PAD | Freq: Every day | CUTANEOUS | Status: DC
  Administered 2020-02-11 – 2020-02-15 (×5): 6 via TOPICAL

## 2020-02-10 NOTE — Progress Notes (Signed)
Subjective: CC: Patient reports that she is having some ongoing LLQ abdominal pain but feeds that this is improved from yesterday. Still endorses some nausea yesterday without emesis. She reports no nausea yesterday. Tolerating CLD and finished most of her tray last night. She is unsure of flatus. Last BM 2/3.  Objective: Vital signs in last 24 hours: Temp:  [98.5 F (36.9 C)-98.8 F (37.1 C)] 98.7 F (37.1 C) (02/05 0931) Pulse Rate:  [95-96] 96 (02/04 1153) Resp:  [18] 18 (02/04 1153) BP: (84-179)/(39-94) 135/76 (02/05 0931) SpO2:  [98 %] 98 % (02/04 1035) Weight:  [102 kg] 102 kg (02/04 1035) Last BM Date: 02/08/20  Intake/Output from previous day: 02/04 0701 - 02/05 0700 In: -  Out: 2800  Intake/Output this shift: No intake/output data recorded.  PE: General appearance: somnolent during HD, cooperative, no distress Resp: clear to auscultation bilaterally GI: Obese, soft, mild tenderness in the left lower quadrant. No guarding or peritnitis. Positive bowel sounds. PD catheter with dressing overlying.   Lab Results:  Recent Labs    02/09/20 0342 02/10/20 0228  WBC 10.8* 11.8*  HGB 7.5* 8.2*  HCT 23.8* 26.4*  PLT 341 357   BMET Recent Labs    02/09/20 0342 02/10/20 0228  NA 138 134*  K 4.0 3.7  CL 98 94*  CO2 22 23  GLUCOSE 77 89  BUN 21* 13  CREATININE 9.96* 7.76*  CALCIUM 9.7 9.8   PT/INR No results for input(s): LABPROT, INR in the last 72 hours. CMP     Component Value Date/Time   NA 134 (L) 02/10/2020 0228   K 3.7 02/10/2020 0228   CL 94 (L) 02/10/2020 0228   CO2 23 02/10/2020 0228   GLUCOSE 89 02/10/2020 0228   BUN 13 02/10/2020 0228   CREATININE 7.76 (H) 02/10/2020 0228   CALCIUM 9.8 02/10/2020 0228   PROT 7.2 02/05/2020 1850   ALBUMIN 2.8 (L) 02/05/2020 1850   AST 18 02/05/2020 1850   ALT 13 02/05/2020 1850   ALKPHOS 59 02/05/2020 1850   BILITOT 0.7 02/05/2020 1850   GFRNONAA 6 (L) 02/10/2020 0228   GFRAA 4 (L) 05/14/2019  2254   Lipase     Component Value Date/Time   LIPASE 34 05/11/2019 0515       Studies/Results: MR BRAIN W WO CONTRAST  Result Date: 02/09/2020 CLINICAL DATA:  Neuro deficit, acute, stroke suspected. EXAM: MRI HEAD WITHOUT AND WITH CONTRAST TECHNIQUE: Multiplanar, multiecho pulse sequences of the brain and surrounding structures were obtained without and with intravenous contrast. CONTRAST:  3mL GADAVIST GADOBUTROL 1 MMOL/ML IV SOLN COMPARISON:  Head CT May 12, 2019. FINDINGS: Brain: No acute infarction, hemorrhage, hydrocephalus, extra-axial collection or mass lesion. The brain parenchyma has normal morphology and signal characteristics. No focus of abnormal contrast enhancement. Vascular: Normal flow voids. Skull and upper cervical spine: Diffuse decrease of the T1 signal within the calvarium and visualized upper cervical spine may represent red marrow reconversion in the setting of anemia. Sinuses/Orbits: Negative. IMPRESSION: 1. No acute intracranial abnormality. Unremarkable MRI of the brain. 2. Diffuse decrease of the T1 signal within the calvarium and visualized upper cervical spine may represent red marrow reconversion in the setting of anemia. Electronically Signed   By: Pedro Earls M.D.   On: 02/09/2020 08:38   CT ABDOMEN PELVIS W CONTRAST  Result Date: 02/09/2020 CLINICAL DATA:  Intra-abdominal abscess, abdominal pain for 1 week; history end-stage renal disease on peritoneal dialysis, hypertension  EXAM: CT ABDOMEN AND PELVIS WITH CONTRAST TECHNIQUE: Multidetector CT imaging of the abdomen and pelvis was performed using the standard protocol following bolus administration of intravenous contrast. Sagittal and coronal MPR images reconstructed from axial data set. No oral contrast. CONTRAST:  63mL OMNIPAQUE IOHEXOL 300 MG/ML  SOLN IV COMPARISON:  02/06/2020 FINDINGS: Lower chest: Minimal subsegmental atelectasis in the lower lobes. Hepatobiliary: Mildly increased density  dependently in gallbladder likely vicarious excretion of contrast slightly increased from previous exam. Gallbladder and liver otherwise normal appearance Pancreas: Normal appearance Spleen: Normal appearance. Splenule adjacent to inferior spleen 2 cm diameter. Adrenals/Urinary Tract: Adrenal glands normal appearance. BILATERAL renal cortical thinning and small cysts. No additional mass, hydronephrosis, hydroureter, or ureteral calcification. Bladder unremarkable. Stomach/Bowel: Stomach decompressed. Appendix filled with contrast. No evidence of appendicitis. Bowel wall thickening of sigmoid colon with few scattered sigmoid diverticula seen, likely reflecting diverticulitis. Adjacent thickening of the rectal wall. Small fluid collection between the sigmoid colon and rectum 16 x 15 mm consistent with small pericolic diverticular abscess decreased from the 3.3 x 2.3 cm on the prior study. No new abscess collections. Vascular/Lymphatic: Atherosclerotic calcifications aorta and iliac arteries. Multiple pelvic phleboliths. No adenopathy. Few normal sized retroperitoneal lymph nodes noted. Reproductive: Uterus surgically absent.  Unremarkable ovaries. Other: Peritoneal dialysis catheter in pelvis. No free fluid or free air. Umbilical hernia containing fat. Small supraumbilical ventral hernia containing fat. Musculoskeletal: Unremarkable IMPRESSION: Persistent wall thickening of the sigmoid colon and rectum with surrounding infiltrative changes consistent with diverticulitis. Decreased size of a small pericolic diverticular abscess collection, now 16 x 15 mm. Peritoneal dialysis catheter in pelvis. Umbilical and supraumbilical ventral hernias containing fat. Aortic Atherosclerosis (ICD10-I70.0). Electronically Signed   By: Lavonia Dana M.D.   On: 02/09/2020 14:04   EEG adult  Result Date: 02/10/2020 Lora Havens, MD     02/10/2020  9:03 AM [name] MRN: [id number] Epilepsy Attending:  Lora Havens Referring Physician/Provider: Anibal Henderson, NP Date: 02/09/2020 Duration: 30.04 mins Patient history: 45 year old female with complains of rapid eyelid fluttering with associated rapid right upward eye deviation which has progressively worsened over the past two months which is now impairing her function and vision. EEG to evaluate for seizure Level of alertness: Awake AEDs during EEG study: GBP Technical aspects: This EEG study was done with scalp electrodes positioned according to the 10-20 International system of electrode placement. Electrical activity was acquired at a sampling rate of $Remov'500Hz'QngnyS$  and reviewed with a high frequency filter of $RemoveB'70Hz'qaKEHlAm$  and a low frequency filter of $RemoveB'1Hz'wPlLyqlM$ . EEG data were recorded continuously and digitally stored. Description: The posterior dominant rhythm consists of 8-9 Hz activity of moderate voltage (25-35 uV) seen predominantly in posterior head regions, symmetric and reactive to eye opening and eye closing. Hyperventilation and photic stimulation were not performed.   IMPRESSION: This study is within normal limits. No seizures or epileptiform discharges were seen throughout the recording. Priyanka Barbra Sarks    Anti-infectives: Anti-infectives (From admission, onward)   Start     Dose/Rate Route Frequency Ordered Stop   02/06/20 2000  piperacillin-tazobactam (ZOSYN) IVPB 3.375 g  Status:  Discontinued       Note to Pharmacy: Zosyn 3.375 g IV q12h in ESRD on HD   3.375 g 12.5 mL/hr over 240 Minutes Intravenous Every 12 hours 02/06/20 0352 02/06/20 0806   02/06/20 1200  piperacillin-tazobactam (ZOSYN) IVPB 2.25 g       Note to Pharmacy: Zosyn 3.375 g IV q12h in ESRD  on HD   2.25 g 100 mL/hr over 30 Minutes Intravenous Every 8 hours 02/06/20 0806     02/06/20 0245  piperacillin-tazobactam (ZOSYN) IVPB 3.375 g        3.375 g 100 mL/hr over 30 Minutes Intravenous  Once 02/06/20 0238 02/06/20 0415       Assessment/Plan End-stage renal disease (PD >> HD  MWF) GI bleed -? Diverticular related Hypertension Acute on chronic anemia Possible Right labial abscess - consider GYN cosult - Defer to TRH -   Suspected diverticulitis with pericolonic abscess between the rectum and sigmoid - CT 2/1 w/ Changes of diverticulosis with a fluid collection posterior to the sigmoid and anterior to the rectum as described suspicious for small pericolonic abscess likely arising from the posterior wall of the sigmoid colon. - Repeat CT 2/4 w/ persistent wall thickening of the sigmoid colon and rectum with surrounding infiltrative changes consistent with diverticulitis. Decreased size of a small pericolic diverticular abscess collection, now measuring 16 x 15 mm. Appears to small to drain - WBC 13.8>> 14.8>> 13.3>> 13.2 >>10.8>>11.8 - Cont IV abx  - Allow FLD given having less pain today and minimal tenderness on exam.  - Repeat CBC in the AM - Hopefully will improve with conservative therapies and be able to avoid surgery during admission.   FEN: FLD ID: Zosyn 2/1 >> day 4. WBC as above. Afebrile  DVT: okay for chemical prophylaxis from a general surgery standpoint  Follow-up: TBD    LOS: 4 days    Jillyn Ledger , [facility] Surgery 02/10/2020, 9:56 AM Please see Amion for pager number during day hours 7:00am-4:30pm

## 2020-02-10 NOTE — Progress Notes (Signed)
PROGRESS NOTE    Patricia [name]  WNU:[id number] DOB: June 10, 1975 DOA: 02/05/2020 PCP: Doyle Askew, PA-C   Brief Narrative:  Patricia [name] is a 45 y.o. female with medical history significant for ESRD on hemodialysis MWF, hypertension, anxiety, and history of recurrent GI bleeding, now presenting to the emergency department for evaluation of abdominal pain and rectal bleeding.  Patient reports 1 day of pain in the lower abdomen, fatigue, exertional dyspnea, and rectal bleeding.  She describes having an urge to defecate and passing bright red blood and clots.  She has had several episodes of this bleeding.  The pain has been constant for the past day, was worse after she ate something, and so she has not eaten anything since yesterday.  She denies similar symptoms previously and notes that she had dark stool with her previous GI bleeds.  Patient has had multiple endoscopic evaluations with GI revealing multiple issues including distal small bowel AVMs, nonbleeding gastric ulcers, erosive gastritis, duodenitis, diverticulosis and polyps. Upon arrival to the ED fecal occult blood testing was found to be positive.  CT abdomen shows pericolonic abscess.  Surgery and GI contacted by ED physician for further evaluation and treatment, hospitalist group called for admission.  Per imaging and evaluation with both surgery and GI patient was nonoperative, unfortunately abdominal pericolic abscess also unamenable to IR drainage.  Questionable GI bleed, acute on what appears to be a chronic recurrent issue, appears to be resolving.  No plan for colonoscopy at this point.  Patient slowly improving with IV antibiotics and supportive care.  Continue dialysis Monday Wednesday Friday per nephrology.  Repeat CT shows improving abscess, defer to surgery for disposition and transition to p.o. antibiotics for remainder of course.  Likely discharge home once surgery clears for advancement of diet and  p.o. antibiotics in the setting of pericolonic abscess/diverticulitis.  Assessment & Plan:   Principal Problem:   Pericolonic abscess Active Problems:   Hypertension   Symptomatic anemia   GI bleed   Acute on chronic blood loss anemia   Anemia   ESRD on hemodialysis (HCC)   Sepsis secondary to pericolonic abscess, POA  - 3.3 x 2.3 cm pericolonic abscess on CT at intake - repeat CT with smaller 15x76mm abscess collection - Abscess not amenable to drainage, appreciate GI and surgery evaluation and expertise - Continue conservative management currently with IV Zosyn, fluids and symptom control.  Continue advancing diet per surgery.  Lower GI bleeding likely secondary to above with acute symptomatic blood loss anemia on chronic anemia of chronic disease, POA   - Appears to be recurrent given patient's history of endoscopy as above - GI sign off - colonoscopy in 6 weeks; outpatient follow up in 3/4 weeks - reconsult if bleeding worsens/returns  ESRD MWF - Nephrology following, appreciate insight and recommendations -continue Monday Wednesday Friday schedule  Eye flutter/twitch  - Neurology following, appreciate insight and recommendations  - MRI with no acute intracranial abnormality; EEG pending; B12, iron panel unremarkable  Right labial swelling  -Per surgery, warm compress likely covered by above antibiotics -Questionably resolving abscess given previous discharge  Hypertension  - Labetalol as needed for now     DVT prophylaxis: SCDs Code Status: Full  Family Communication: None  Status is: Inpatient  Dispo: The patient is from: Home              Anticipated d/c is to: Home              Anticipated  d/c date is: 24-48 hours              Patient currently not medically stable for discharge  Consultants:   GI, general surgery, nephrology  Procedures:   None  Antimicrobials:  Zosyn, 02/05/2020, ongoing  Subjective: No acute issues or events overnight,  patient reports mild abdominal discomfort with oral intake but denies overt nausea vomiting diarrhea or constipation.  Objective: Vitals:   02/09/20 1030 02/09/20 1035 02/09/20 1153 02/09/20 2100  BP: (!) 84/39 (!) 141/93 (!) 144/92 (!) 179/94  Pulse:  95 96   Resp:  18 18   Temp:  98.5 F (36.9 C) 98.8 F (37.1 C)   TempSrc:  Oral Oral   SpO2:  98%    Weight:  102 kg    Height:        Intake/Output Summary (Last 24 hours) at 02/10/2020 0823 Last data filed at 02/09/2020 1035 Gross per 24 hour  Intake -  Output 2800 ml  Net -2800 ml   Filed Weights   02/08/20 0315 02/09/20 0748 02/09/20 1035  Weight: 101.6 kg 104.9 kg 102 kg    Examination:  General exam: Appears calm and comfortable  Respiratory system: Clear to auscultation. Respiratory effort normal. Cardiovascular system: S1 & S2 heard, RRR. No JVD, murmurs, rubs, gallops or clicks. No pedal edema. Gastrointestinal system: Abdomen is nondistended, soft and nontender. No organomegaly or masses felt. Normal bowel sounds heard. Central nervous system: Alert and oriented. No focal neurological deficits. Extremities: Symmetric 5 x 5 power. Skin: No rashes, lesions or ulcers Psychiatry: Judgement and insight appear normal. Mood & affect appropriate.   Data Reviewed: I have personally reviewed following labs and imaging studies  CBC: Recent Labs  Lab 02/06/20 0835 02/06/20 1627 02/07/20 0223 02/07/20 0702 02/07/20 1834 02/08/20 0454 02/09/20 0342 02/10/20 0228  WBC 13.8*   < > 13.5* 13.3*  --  13.2* 10.8* 11.8*  NEUTROABS 10.8*  --   --   --   --   --   --   --   HGB 7.3*   < > 7.0* 7.1* 7.1* 7.5* 7.5* 8.2*  HCT 23.3*   < > 20.6* 21.9* 20.5* 23.2* 23.8* 26.4*  MCV 87.9   < > 84.8 86.2  --  87.9 88.5 88.3  PLT 259   < > 270 279  --  298 341 357   < > = values in this interval not displayed.   Basic Metabolic Panel: Recent Labs  Lab 02/06/20 0827 02/07/20 0702 02/08/20 0454 02/08/20 2105 02/09/20 0342  02/10/20 0228  NA 133* 134* 135  --  138 134*  K 3.6 3.6 3.8  --  4.0 3.7  CL 93* 91* 95*  --  98 94*  CO2 $Re'25 24 24  'bJG$ --  22 23  GLUCOSE 80 87 94  --  77 89  BUN 26* 34* 15  --  21* 13  CREATININE 9.08* 11.67* 6.80*  --  9.96* 7.76*  CALCIUM 9.0 9.3 8.8*  --  9.7 9.8  MG  --   --   --  2.0  --   --    GFR: Estimated Creatinine Clearance: 11.1 mL/min (A) (by C-G formula based on SCr of 7.76 mg/dL (H)). Liver Function Tests: Recent Labs  Lab 02/05/20 1850  AST 18  ALT 13  ALKPHOS 59  BILITOT 0.7  PROT 7.2  ALBUMIN 2.8*   No results for input(s): LIPASE, AMYLASE in the last 168 hours. No  results for input(s): AMMONIA in the last 168 hours. Coagulation Profile: No results for input(s): INR, PROTIME in the last 168 hours. Cardiac Enzymes: No results for input(s): CKTOTAL, CKMB, CKMBINDEX, TROPONINI in the last 168 hours. BNP (last 3 results) No results for input(s): PROBNP in the last 8760 hours. HbA1C: No results for input(s): HGBA1C in the last 72 hours. CBG: No results for input(s): GLUCAP in the last 168 hours. Lipid Profile: No results for input(s): CHOL, HDL, LDLCALC, TRIG, CHOLHDL, LDLDIRECT in the last 72 hours. Thyroid Function Tests: No results for input(s): TSH, T4TOTAL, FREET4, T3FREE, THYROIDAB in the last 72 hours. Anemia Panel: Recent Labs    02/08/20 2105  VITAMINB12 6,835*  TIBC 171*  IRON 47   Sepsis Labs: No results for input(s): PROCALCITON, LATICACIDVEN in the last 168 hours.  Recent Results (from the past 240 hour(s))  SARS Coronavirus 2 by RT PCR (hospital order, performed in [facility] hospital lab) Nasopharyngeal Nasopharyngeal Swab     Status: None   Collection Time: 02/06/20  1:29 AM   Specimen: Nasopharyngeal Swab  Result Value Ref Range Status   SARS Coronavirus 2 NEGATIVE NEGATIVE Final    Comment: (NOTE) SARS-CoV-2 target nucleic acids are NOT DETECTED.  The SARS-CoV-2 RNA is generally detectable in upper and lower respiratory  specimens during the acute phase of infection. The lowest concentration of SARS-CoV-2 viral copies this assay can detect is 250 copies / mL. A negative result does not preclude SARS-CoV-2 infection and should not be used as the sole basis for treatment or other patient management decisions.  A negative result may occur with improper specimen collection / handling, submission of specimen other than nasopharyngeal swab, presence of viral mutation(s) within the areas targeted by this assay, and inadequate number of viral copies (<250 copies / mL). A negative result must be combined with clinical observations, patient history, and epidemiological information.  Fact Sheet for Patients:   StrictlyIdeas.no  Fact Sheet for Healthcare Providers: BankingDealers.co.za  This test is not yet approved or  cleared by the [place] FDA and has been authorized for detection and/or diagnosis of SARS-CoV-2 by FDA under an Emergency Use Authorization (EUA).  This EUA will remain in effect (meaning this test can be used) for the duration of the COVID-19 declaration under Section 564(b)(1) of the Act, 21 U.S.C. section 360bbb-3(b)(1), unless the authorization is terminated or revoked sooner.  Performed at [facility], [place] [address], [place], [place] [address]   MRSA PCR Screening     Status: None   Collection Time: 02/06/20 11:05 PM   Specimen: Nasopharyngeal  Result Value Ref Range Status   MRSA by PCR NEGATIVE NEGATIVE Final    Comment:        The GeneXpert MRSA Assay (FDA approved for NASAL specimens only), is one component of a comprehensive MRSA colonization surveillance program. It is not intended to diagnose MRSA infection nor to guide or monitor treatment for MRSA infections. Performed at [facility], [place] [address], [place], [place] [address]      Radiology Studies: MR BRAIN W WO CONTRAST  Result Date:  02/09/2020 CLINICAL DATA:  Neuro deficit, acute, stroke suspected. EXAM: MRI HEAD WITHOUT AND WITH CONTRAST TECHNIQUE: Multiplanar, multiecho pulse sequences of the brain and surrounding structures were obtained without and with intravenous contrast. CONTRAST:  58mL GADAVIST GADOBUTROL 1 MMOL/ML IV SOLN COMPARISON:  Head CT May 12, 2019. FINDINGS: Brain: No acute infarction, hemorrhage, hydrocephalus, extra-axial collection or mass lesion.  The brain parenchyma has normal morphology and signal characteristics. No focus of abnormal contrast enhancement. Vascular: Normal flow voids. Skull and upper cervical spine: Diffuse decrease of the T1 signal within the calvarium and visualized upper cervical spine may represent red marrow reconversion in the setting of anemia. Sinuses/Orbits: Negative. IMPRESSION: 1. No acute intracranial abnormality. Unremarkable MRI of the brain. 2. Diffuse decrease of the T1 signal within the calvarium and visualized upper cervical spine may represent red marrow reconversion in the setting of anemia. Electronically Signed   By: Pedro Earls M.D.   On: 02/09/2020 08:38   CT ABDOMEN PELVIS W CONTRAST  Result Date: 02/09/2020 CLINICAL DATA:  Intra-abdominal abscess, abdominal pain for 1 week; history end-stage renal disease on peritoneal dialysis, hypertension EXAM: CT ABDOMEN AND PELVIS WITH CONTRAST TECHNIQUE: Multidetector CT imaging of the abdomen and pelvis was performed using the standard protocol following bolus administration of intravenous contrast. Sagittal and coronal MPR images reconstructed from axial data set. No oral contrast. CONTRAST:  65mL OMNIPAQUE IOHEXOL 300 MG/ML  SOLN IV COMPARISON:  02/06/2020 FINDINGS: Lower chest: Minimal subsegmental atelectasis in the lower lobes. Hepatobiliary: Mildly increased density dependently in gallbladder likely vicarious excretion of contrast slightly increased from previous exam. Gallbladder and liver otherwise normal  appearance Pancreas: Normal appearance Spleen: Normal appearance. Splenule adjacent to inferior spleen 2 cm diameter. Adrenals/Urinary Tract: Adrenal glands normal appearance. BILATERAL renal cortical thinning and small cysts. No additional mass, hydronephrosis, hydroureter, or ureteral calcification. Bladder unremarkable. Stomach/Bowel: Stomach decompressed. Appendix filled with contrast. No evidence of appendicitis. Bowel wall thickening of sigmoid colon with few scattered sigmoid diverticula seen, likely reflecting diverticulitis. Adjacent thickening of the rectal wall. Small fluid collection between the sigmoid colon and rectum 16 x 15 mm consistent with small pericolic diverticular abscess decreased from the 3.3 x 2.3 cm on the prior study. No new abscess collections. Vascular/Lymphatic: Atherosclerotic calcifications aorta and iliac arteries. Multiple pelvic phleboliths. No adenopathy. Few normal sized retroperitoneal lymph nodes noted. Reproductive: Uterus surgically absent.  Unremarkable ovaries. Other: Peritoneal dialysis catheter in pelvis. No free fluid or free air. Umbilical hernia containing fat. Small supraumbilical ventral hernia containing fat. Musculoskeletal: Unremarkable IMPRESSION: Persistent wall thickening of the sigmoid colon and rectum with surrounding infiltrative changes consistent with diverticulitis. Decreased size of a small pericolic diverticular abscess collection, now 16 x 15 mm. Peritoneal dialysis catheter in pelvis. Umbilical and supraumbilical ventral hernias containing fat. Aortic Atherosclerosis (ICD10-I70.0). Electronically Signed   By: Lavonia Dana M.D.   On: 02/09/2020 14:04   Scheduled Meds: . Chlorhexidine Gluconate Cloth  6 each Topical Q0600  . gabapentin  300 mg Oral BID  . hydrALAZINE  50 mg Oral BID  . influenza vac split quadrivalent PF  0.5 mL Intramuscular Tomorrow-1000  . pantoprazole  40 mg Oral Q0600   Continuous Infusions: . sodium chloride 10 mL/hr at  02/07/20 1511  . piperacillin-tazobactam (ZOSYN)  IV 2.25 g (02/10/20 0433)    LOS: 4 days   Time spent: 86 min  Little Ishikawa, DO [facility]  If 7PM-7AM, please contact night-coverage [email or web address]  02/10/2020, 8:23 AM

## 2020-02-10 NOTE — Procedures (Signed)
[name]  MRN: [id number]  Epilepsy Attending: Lora Havens  Referring Physician/Provider: Anibal Henderson, NP Date: 02/09/2020 Duration: 30.04 mins  Patient history: 45 year old female with complains of rapid eyelid fluttering with associated rapid right upward eye deviation which has progressively worsened over the past two months which is now impairing her function and vision. EEG to evaluate for seizure  Level of alertness: Awake  AEDs during EEG study: GBP   Technical aspects: This EEG study was done with scalp electrodes positioned according to the 10-20 International system of electrode placement. Electrical activity was acquired at a sampling rate of $Remov'500Hz'IfvYPL$  and reviewed with a high frequency filter of $RemoveB'70Hz'DpWeEWkZ$  and a low frequency filter of $RemoveB'1Hz'dIZkeJOc$ . EEG data were recorded continuously and digitally stored.   Description: The posterior dominant rhythm consists of 8-9 Hz activity of moderate voltage (25-35 uV) seen predominantly in posterior head regions, symmetric and reactive to eye opening and eye closing.  Hyperventilation and photic stimulation were not performed.     IMPRESSION: This study is within normal limits. No seizures or epileptiform discharges were seen throughout the recording.  Mariamawit Depaoli Barbra Sarks

## 2020-02-10 NOTE — Progress Notes (Signed)
Neurology Progress Note Quaneshia Wareing MR# [id number] 02/10/2020  S: No new complaints MRI done and EEG done.  O: Current vital signs: BP 135/76 (BP Location: Right Arm)   Pulse 96   Temp 98.7 F (37.1 C) (Oral)   Resp 18   Ht $R'5\' 7"'eR$  (1.702 m)   Wt 102 kg   SpO2 98%   BMI 35.22 kg/m  Vital signs in last 24 hours: Temp:  [98.7 F (37.1 C)] 98.7 F (37.1 C) (02/05 0931) BP: (135-179)/(76-94) 135/76 (02/05 0931)  Exam remains unchanged. Mental Status: Patient is awake, alert, oriented to person, place, month, year, and situation.She is able to give a clear and coherent history. Speech is intact without dysarthria or aphasia. Comprehension, fluency, and naming intact. No signs of neglect noted. Cranial Nerves: II: Visual Fields are full. Pupils are equal, round, and reactive to light.  3-4 mm/brisk. III,IV, VI: EOMI with noticeable right eye lag; without ptosis. There is intermittent rapid eyelid fluttering with rapid eye deviation up and to the right. V: Face sensation symmetric to light touch VII: Face is symmetric resting and smiling VIII: Hearing is intact to voice X: Palate elevated symmetrically, with involuntary contractions of palate that correlates with involuntary eye fluttering and mouth movement/smacking. XI: Shoulder shrug is symmetric. XII: Tongue protrudes midline with fasciculations.  Motor: Tone is normal. Bulk is normal. 5/5 strength was present in all four extremities. Antigravity movement present in all extremities without pronator drift. Tremor with intention that is not present at rest.  Sensory: Sensation is symmetric to light touch in the arms and legs. Cerebellar: FNF and HKS are intact bilaterally; tremor that increases with intention, disappears with rest.  Medications  Current Facility-Administered Medications:  .  0.9 %  sodium chloride infusion, , Intravenous, PRN, Little Ishikawa, MD, Last Rate: 10 mL/hr at 02/07/20 1511, Infusion  Verify at 02/07/20 1511 .  acetaminophen (TYLENOL) tablet 650 mg, 650 mg, Oral, Q6H PRN, 650 mg at 02/07/20 1756 **OR** acetaminophen (TYLENOL) suppository 650 mg, 650 mg, Rectal, Q6H PRN, Opyd, Ilene Qua, MD .  ALPRAZolam Duanne Moron) tablet 1 mg, 1 mg, Oral, BID PRN, Shela Leff, MD, 1 mg at 02/09/20 1705 .  Chlorhexidine Gluconate Cloth 2 % PADS 6 each, 6 each, Topical, Q0600, Penninger, Lindsay, PA .  gabapentin (NEURONTIN) capsule 300 mg, 300 mg, Oral, BID, Little Ishikawa, MD, 300 mg at 02/10/20 0940 .  hydrALAZINE (APRESOLINE) tablet 50 mg, 50 mg, Oral, BID, Little Ishikawa, MD, 50 mg at 02/10/20 0940 .  hydrOXYzine (ATARAX/VISTARIL) tablet 25 mg, 25 mg, Oral, Q6H PRN, Little Ishikawa, MD, 25 mg at 02/09/20 2009 .  influenza vac split quadrivalent PF (FLUARIX) injection 0.5 mL, 0.5 mL, Intramuscular, Tomorrow-1000, Little Ishikawa, MD .  labetalol (NORMODYNE) injection 10 mg, 10 mg, Intravenous, Q2H PRN, Opyd, Ilene Qua, MD, 10 mg at 02/07/20 1436 .  LORazepam (ATIVAN) injection 1 mg, 1 mg, Intravenous, Q15 min PRN, Donnetta Simpers, MD, 1 mg at 02/09/20 0631 .  ondansetron (ZOFRAN) tablet 4 mg, 4 mg, Oral, Q6H PRN **OR** ondansetron (ZOFRAN) injection 4 mg, 4 mg, Intravenous, Q6H PRN, Opyd, Ilene Qua, MD, 4 mg at 02/09/20 1130 .  pantoprazole (PROTONIX) EC tablet 40 mg, 40 mg, Oral, Q0600, Vena Rua, PA-C, 40 mg at 02/10/20 0558 .  piperacillin-tazobactam (ZOSYN) IVPB 2.25 g, 2.25 g, Intravenous, Q8H, von Dohlen, Haley B, RPH, Last Rate: 100 mL/hr at 02/10/20 1143, 2.25 g at 02/10/20 1143 .  traMADol Veatrice Bourbon)  tablet 50 mg, 50 mg, Oral, Q6H PRN, Little Ishikawa, MD, 50 mg at 02/09/20 1705 Labs     Component Value Date/Time   WBC 11.8 (H) 02/10/2020 0228   RBC 2.99 (L) 02/10/2020 0228   HGB 8.2 (L) 02/10/2020 0228   HCT 26.4 (L) 02/10/2020 0228   HCT 27.5 (L) 04/05/2018 0943   PLT 357 02/10/2020 0228   MCV 88.3 02/10/2020 0228   MCH 27.4 02/10/2020  0228   MCHC 31.1 02/10/2020 0228   RDW 14.6 02/10/2020 0228   LYMPHSABS 1.6 02/06/2020 0835   MONOABS 1.1 (H) 02/06/2020 0835   EOSABS 0.2 02/06/2020 0835   BASOSABS 0.1 02/06/2020 0835       Component Value Date/Time   NA 134 (L) 02/10/2020 0228   K 3.7 02/10/2020 0228   CL 94 (L) 02/10/2020 0228   CO2 23 02/10/2020 0228   GLUCOSE 89 02/10/2020 0228   BUN 13 02/10/2020 0228   CREATININE 7.76 (H) 02/10/2020 0228   CALCIUM 9.8 02/10/2020 0228   PROT 7.2 02/05/2020 1850   ALBUMIN 2.8 (L) 02/05/2020 1850   AST 18 02/05/2020 1850   ALT 13 02/05/2020 1850   ALKPHOS 59 02/05/2020 1850   BILITOT 0.7 02/05/2020 1850   GFRNONAA 6 (L) 02/10/2020 0228   GFRAA 4 (L) 05/14/2019 2254       Component Value Date/Time   CHOL 199 05/11/2019 0515   TRIG 136 05/11/2019 0515   HDL 37 (L) 05/11/2019 0515   CHOLHDL 5.4 05/11/2019 0515   VLDL 27 05/11/2019 0515   LDLCALC 135 (H) 05/11/2019 0515   EEG results within normal limits. No seizures or epileptiform discharges were seen throughout the recording.  Imaging I have reviewed images in epic and the results pertinent to this consultation are: MRI Brain showed no acute intracranial abnormality. Unremarkable MRI of the brain. Diffuse decrease of the T1 signal within the calvarium and visualized upper cervical spine may represent red marrow reconversion in the setting of anemia.  Assessment and plan: 45 year old woman chronic rapideyelidfluttering with associated rapid right upward eye deviation which has progressively worsened over the past two months which is now impairing her daily function. MRI did not show abnormalities in the area of the inferior olivary nucleus/olivary hypertrophy. 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   The patient will need to continue workup in outpatient neurology  clinic and recommend referral.  Electronically signed by:  Lynnae Sandhoff, MD Page: [phone] 02/10/2020, 4:53 PM

## 2020-02-10 NOTE — Progress Notes (Addendum)
[facility] Progress Note   Subjective:   Patient seen and examined at bedside.  Reports nausea this AM.  Tolerated jello yesterday.  States dialysis went well.  Denies CP, SOB and v/d.  Pain mostly well controlled currently.  EEG completed yesterday results still pending.   Objective Vitals:   02/09/20 1030 02/09/20 1035 02/09/20 1153 02/09/20 2100  BP: (!) 84/39 (!) 141/93 (!) 144/92 (!) 179/94  Pulse:  95 96   Resp:  18 18   Temp:  98.5 F (36.9 C) 98.8 F (37.1 C)   TempSrc:  Oral Oral   SpO2:  98%    Weight:  102 kg    Height:       Physical Exam General:Well developed female in NAD, sleepy but opens eyes to verbal stimuli and answers questions Heart:RRR, no mrg Lungs:CTAB, nml WOB Abdomen:soft, NTND, +BS Extremities:no LE edema Dialysis Access: [facility]   Filed Weights   02/08/20 0315 02/09/20 0748 02/09/20 1035  Weight: 101.6 kg 104.9 kg 102 kg    Intake/Output Summary (Last 24 hours) at 02/10/2020 0857 Last data filed at 02/09/2020 1035 Gross per 24 hour  Intake --  Output 2800 ml  Net -2800 ml    Additional Objective Labs: Basic Metabolic Panel: Recent Labs  Lab 02/08/20 0454 02/09/20 0342 02/10/20 0228  NA 135 138 134*  K 3.8 4.0 3.7  CL 95* 98 94*  CO2 $Re'24 22 23  'dSg$ GLUCOSE 94 77 89  BUN 15 21* 13  CREATININE 6.80* 9.96* 7.76*  CALCIUM 8.8* 9.7 9.8   Liver Function Tests: Recent Labs  Lab 02/05/20 1850  AST 18  ALT 13  ALKPHOS 59  BILITOT 0.7  PROT 7.2  ALBUMIN 2.8*   CBC: Recent Labs  Lab 02/06/20 0835 02/06/20 1627 02/07/20 0223 02/07/20 0702 02/07/20 1834 02/08/20 0454 02/09/20 0342 02/10/20 0228  WBC 13.8*   < > 13.5* 13.3*  --  13.2* 10.8* 11.8*  NEUTROABS 10.8*  --   --   --   --   --   --   --   HGB 7.3*   < > 7.0* 7.1*   < > 7.5* 7.5* 8.2*  HCT 23.3*   < > 20.6* 21.9*   < > 23.2* 23.8* 26.4*  MCV 87.9   < > 84.8 86.2  --  87.9 88.5 88.3  PLT 259   < > 270 279  --  298 341 357   < > = values in this  interval not displayed.   Iron Studies:  Recent Labs    02/08/20 2105  IRON 47  TIBC 171*   Lab Results  Component Value Date   INR 1.1 05/11/2019   INR 1.07 02/26/2018   INR 0.98 12/22/2017   Studies/Results: MR BRAIN W WO CONTRAST  Result Date: 02/09/2020 CLINICAL DATA:  Neuro deficit, acute, stroke suspected. EXAM: MRI HEAD WITHOUT AND WITH CONTRAST TECHNIQUE: Multiplanar, multiecho pulse sequences of the brain and surrounding structures were obtained without and with intravenous contrast. CONTRAST:  38mL GADAVIST GADOBUTROL 1 MMOL/ML IV SOLN COMPARISON:  Head CT May 12, 2019. FINDINGS: Brain: No acute infarction, hemorrhage, hydrocephalus, extra-axial collection or mass lesion. The brain parenchyma has normal morphology and signal characteristics. No focus of abnormal contrast enhancement. Vascular: Normal flow voids. Skull and upper cervical spine: Diffuse decrease of the T1 signal within the calvarium and visualized upper cervical spine may represent red marrow reconversion in the setting of anemia. Sinuses/Orbits: Negative. IMPRESSION: 1.  No acute intracranial abnormality. Unremarkable MRI of the brain. 2. Diffuse decrease of the T1 signal within the calvarium and visualized upper cervical spine may represent red marrow reconversion in the setting of anemia. Electronically Signed   By: Pedro Earls M.D.   On: 02/09/2020 08:38   CT ABDOMEN PELVIS W CONTRAST  Result Date: 02/09/2020 CLINICAL DATA:  Intra-abdominal abscess, abdominal pain for 1 week; history end-stage renal disease on peritoneal dialysis, hypertension EXAM: CT ABDOMEN AND PELVIS WITH CONTRAST TECHNIQUE: Multidetector CT imaging of the abdomen and pelvis was performed using the standard protocol following bolus administration of intravenous contrast. Sagittal and coronal MPR images reconstructed from axial data set. No oral contrast. CONTRAST:  29mL OMNIPAQUE IOHEXOL 300 MG/ML  SOLN IV COMPARISON:  02/06/2020  FINDINGS: Lower chest: Minimal subsegmental atelectasis in the lower lobes. Hepatobiliary: Mildly increased density dependently in gallbladder likely vicarious excretion of contrast slightly increased from previous exam. Gallbladder and liver otherwise normal appearance Pancreas: Normal appearance Spleen: Normal appearance. Splenule adjacent to inferior spleen 2 cm diameter. Adrenals/Urinary Tract: Adrenal glands normal appearance. BILATERAL renal cortical thinning and small cysts. No additional mass, hydronephrosis, hydroureter, or ureteral calcification. Bladder unremarkable. Stomach/Bowel: Stomach decompressed. Appendix filled with contrast. No evidence of appendicitis. Bowel wall thickening of sigmoid colon with few scattered sigmoid diverticula seen, likely reflecting diverticulitis. Adjacent thickening of the rectal wall. Small fluid collection between the sigmoid colon and rectum 16 x 15 mm consistent with small pericolic diverticular abscess decreased from the 3.3 x 2.3 cm on the prior study. No new abscess collections. Vascular/Lymphatic: Atherosclerotic calcifications aorta and iliac arteries. Multiple pelvic phleboliths. No adenopathy. Few normal sized retroperitoneal lymph nodes noted. Reproductive: Uterus surgically absent.  Unremarkable ovaries. Other: Peritoneal dialysis catheter in pelvis. No free fluid or free air. Umbilical hernia containing fat. Small supraumbilical ventral hernia containing fat. Musculoskeletal: Unremarkable IMPRESSION: Persistent wall thickening of the sigmoid colon and rectum with surrounding infiltrative changes consistent with diverticulitis. Decreased size of a small pericolic diverticular abscess collection, now 16 x 15 mm. Peritoneal dialysis catheter in pelvis. Umbilical and supraumbilical ventral hernias containing fat. Aortic Atherosclerosis (ICD10-I70.0). Electronically Signed   By: Lavonia Dana M.D.   On: 02/09/2020 14:04    Medications: . sodium chloride 10 mL/hr  at 02/07/20 1511  . piperacillin-tazobactam (ZOSYN)  IV 2.25 g (02/10/20 0433)   . Chlorhexidine Gluconate Cloth  6 each Topical Q0600  . gabapentin  300 mg Oral BID  . hydrALAZINE  50 mg Oral BID  . influenza vac split quadrivalent PF  0.5 mL Intramuscular Tomorrow-1000  . pantoprazole  40 mg Oral Q0600    [facility]Re, 4 hours, BFR 400/DFR 800, EDW 97.5kg, 2K/2Ca, UF profile 2, TDC mircera 259mcg q 2 weeks- last dose 02/05/20  Assessment/Plan: 1. Pericolonic abscess: On zosyn. No surgical intervention planned. GI consulted.  CT yesterday showed decrease in size of abscess. Plan for colonoscopy vs flex sig in 6 weeks as OP.  2. Acute lower GI bleed: Secondary to #1 above. Received 1 unit PRBC. Hgb now 8.2. Recently received max dose of ESA. Transfuse PRN. 3. ESRD:Usually PD patient, recently transitioned to HD due to feeling poorly/insufficient clearances with PD. She is agreeable to continue HD for now. On MWF schedule. HD yesterday post MRI w/contrast, plan for HD again today if possible to continue to clear contrast.  Will need to flush PD cath weekly to maintain patency. 4. Hypokalemia: Resolved. K 3.7 today. Using high  K+ baths with HD.   5. Hypertension/volume:BP elevated, no edema on exam. UF with HD as tolerated. Continue home BP meds. Please avoid additional IVF as much as possible. 6. Metabolic bone disease:Calcium at goal. Not on VDRA. Resume binders once tolerating PO 7. Rapid eye fluttering/deviation - neruo consulted.  MRI with & w/o contrast yesterday prior to HD with no acute abnormalities.   EEG pending.   Jen Mow, PA-C [facility] 02/10/2020,8:57 AM  LOS: 4 days   I have seen and examined this patient and agree with plan and assessment in the above note with renal recommendations/intervention highlighted.  Improving on imaging, plan for IV abx and no surgery at this time.  Broadus John A  [place] Mielke,MD 02/10/2020 11:18 AM

## 2020-02-11 DIAGNOSIS — K630 Abscess of intestine: Secondary | ICD-10-CM | POA: Diagnosis not present

## 2020-02-11 LAB — CBC
HCT: 25.5 % — ABNORMAL LOW (ref 36.0–46.0)
Hemoglobin: 7.9 g/dL — ABNORMAL LOW (ref 12.0–15.0)
MCH: 27.9 pg (ref 26.0–34.0)
MCHC: 31 g/dL (ref 30.0–36.0)
MCV: 90.1 fL (ref 80.0–100.0)
Platelets: 387 10*3/uL (ref 150–400)
RBC: 2.83 MIL/uL — ABNORMAL LOW (ref 3.87–5.11)
RDW: 14.6 % (ref 11.5–15.5)
WBC: 12.1 10*3/uL — ABNORMAL HIGH (ref 4.0–10.5)
nRBC: 0.5 % — ABNORMAL HIGH (ref 0.0–0.2)

## 2020-02-11 LAB — BASIC METABOLIC PANEL
Anion gap: 18 — ABNORMAL HIGH (ref 5–15)
BUN: 21 mg/dL — ABNORMAL HIGH (ref 6–20)
CO2: 22 mmol/L (ref 22–32)
Calcium: 10.1 mg/dL (ref 8.9–10.3)
Chloride: 94 mmol/L — ABNORMAL LOW (ref 98–111)
Creatinine, Ser: 9.83 mg/dL — ABNORMAL HIGH (ref 0.44–1.00)
GFR, Estimated: 5 mL/min — ABNORMAL LOW (ref >60)
Glucose, Bld: 86 mg/dL (ref 70–99)
Potassium: 3.6 mmol/L (ref 3.5–5.1)
Sodium: 134 mmol/L — ABNORMAL LOW (ref 135–145)

## 2020-02-11 MED ORDER — HEPARIN SODIUM (PORCINE) 5000 UNIT/ML IJ SOLN
5000.0000 [IU] | Freq: Three times a day (TID) | INTRAMUSCULAR | Status: DC
  Administered 2020-02-11 – 2020-02-16 (×14): 5000 [IU] via SUBCUTANEOUS
  Filled 2020-02-11 (×14): qty 1

## 2020-02-11 MED ORDER — HEPARIN SODIUM (PORCINE) 1000 UNIT/ML IJ SOLN
INTRAMUSCULAR | Status: AC
  Filled 2020-02-11: qty 4

## 2020-02-11 NOTE — Progress Notes (Addendum)
[facility] Progress Note   Subjective:   Patient seen and examined at bedside.  Tolerated dialysis well this AM.  Reports feeling weak and dizzy when she tired to get up and go to the bathroom this morning. States she may have gotten up to fast, as her BP was 123/78.  Denies CP, SOB and edema.  Admits to n/v, unable to hold anything down.  Abdominal pain currently 5/10.    Objective Vitals:   02/11/20 0346 02/11/20 0400 02/11/20 0401 02/11/20 0718  BP: 103/69 (!) 113/59 102/71 123/78  Pulse: 90 100 (!) 103 (!) 108  Resp:  $Remo'18 16 18  'EeGLY$ Temp:  98.1 F (36.7 C)  98.2 F (36.8 C)  TempSrc:  Oral  Oral  SpO2:  98%  98%  Weight:      Height:       Physical Exam General:chronically ill appearing female.  Heart:RRR, no mrg Lungs:CTAB, nml WOB Abdomen:soft, +tenderness, ND Extremities:no LE edema Dialysis Access: R [facility], PD cath   [facility] Weights   02/08/20 0315 02/09/20 0748 02/09/20 1035  Weight: 101.6 kg 104.9 kg 102 kg    Intake/Output Summary (Last 24 hours) at 02/11/2020 7782 Last data filed at 02/11/2020 0400 Gross per 24 hour  Intake --  Output 2034 ml  Net -2034 ml    Additional Objective Labs: Basic Metabolic Panel: Recent Labs  Lab 02/09/20 0342 02/10/20 0228 02/11/20 0141  NA 138 134* 134*  K 4.0 3.7 3.6  CL 98 94* 94*  CO2 $Re'22 23 22  'uFR$ GLUCOSE 77 89 86  BUN 21* 13 21*  CREATININE 9.96* 7.76* 9.83*  CALCIUM 9.7 9.8 10.1   Liver Function Tests: Recent Labs  Lab 02/05/20 1850  AST 18  ALT 13  ALKPHOS 59  BILITOT 0.7  PROT 7.2  ALBUMIN 2.8*   CBC: Recent Labs  Lab 02/06/20 0835 02/06/20 1627 02/07/20 0702 02/07/20 1834 02/08/20 0454 02/09/20 0342 02/10/20 0228 02/11/20 0141  WBC 13.8*   < > 13.3*  --  13.2* 10.8* 11.8* 12.1*  NEUTROABS 10.8*  --   --   --   --   --   --   --   HGB 7.3*   < > 7.1*   < > 7.5* 7.5* 8.2* 7.9*  HCT 23.3*   < > 21.9*   < > 23.2* 23.8* 26.4* 25.5*  MCV 87.9   < > 86.2  --  87.9 88.5 88.3 90.1  PLT  259   < > 279  --  298 341 357 387   < > = values in this interval not displayed.   Iron Studies:  Recent Labs    02/08/20 2105  IRON 47  TIBC 171*   Lab Results  Component Value Date   INR 1.1 05/11/2019   INR 1.07 02/26/2018   INR 0.98 12/22/2017   Studies/Results: CT ABDOMEN PELVIS W CONTRAST  Result Date: 02/09/2020 CLINICAL DATA:  Intra-abdominal abscess, abdominal pain for 1 week; history end-stage renal disease on peritoneal dialysis, hypertension EXAM: CT ABDOMEN AND PELVIS WITH CONTRAST TECHNIQUE: Multidetector CT imaging of the abdomen and pelvis was performed using the standard protocol following bolus administration of intravenous contrast. Sagittal and coronal MPR images reconstructed from axial data set. No oral contrast. CONTRAST:  51mL OMNIPAQUE IOHEXOL 300 MG/ML  SOLN IV COMPARISON:  02/06/2020 FINDINGS: Lower chest: Minimal subsegmental atelectasis in the lower lobes. Hepatobiliary: Mildly increased density dependently in gallbladder likely vicarious excretion of contrast slightly increased from  previous exam. Gallbladder and liver otherwise normal appearance Pancreas: Normal appearance Spleen: Normal appearance. Splenule adjacent to inferior spleen 2 cm diameter. Adrenals/Urinary Tract: Adrenal glands normal appearance. BILATERAL renal cortical thinning and small cysts. No additional mass, hydronephrosis, hydroureter, or ureteral calcification. Bladder unremarkable. Stomach/Bowel: Stomach decompressed. Appendix filled with contrast. No evidence of appendicitis. Bowel wall thickening of sigmoid colon with few scattered sigmoid diverticula seen, likely reflecting diverticulitis. Adjacent thickening of the rectal wall. Small fluid collection between the sigmoid colon and rectum 16 x 15 mm consistent with small pericolic diverticular abscess decreased from the 3.3 x 2.3 cm on the prior study. No new abscess collections. Vascular/Lymphatic: Atherosclerotic calcifications aorta and  iliac arteries. Multiple pelvic phleboliths. No adenopathy. Few normal sized retroperitoneal lymph nodes noted. Reproductive: Uterus surgically absent.  Unremarkable ovaries. Other: Peritoneal dialysis catheter in pelvis. No free fluid or free air. Umbilical hernia containing fat. Small supraumbilical ventral hernia containing fat. Musculoskeletal: Unremarkable IMPRESSION: Persistent wall thickening of the sigmoid colon and rectum with surrounding infiltrative changes consistent with diverticulitis. Decreased size of a small pericolic diverticular abscess collection, now 16 x 15 mm. Peritoneal dialysis catheter in pelvis. Umbilical and supraumbilical ventral hernias containing fat. Aortic Atherosclerosis (ICD10-I70.0). Electronically Signed   By: Lavonia Dana M.D.   On: 02/09/2020 14:04   EEG adult  Result Date: 02/10/2020 Lora Havens, MD     02/10/2020  9:03 AM [name] MRN: [id number] Epilepsy Attending: Lora Havens Referring Physician/Provider: Anibal Henderson, NP Date: 02/09/2020 Duration: 30.04 mins Patient history: 45 year old female with complains of rapid eyelid fluttering with associated rapid right upward eye deviation which has progressively worsened over the past two months which is now impairing her function and vision. EEG to evaluate for seizure Level of alertness: Awake AEDs during EEG study: GBP Technical aspects: This EEG study was done with scalp electrodes positioned according to the 10-20 International system of electrode placement. Electrical activity was acquired at a sampling rate of $Remov'500Hz'GmPaYO$  and reviewed with a high frequency filter of $RemoveB'70Hz'drTEjbSO$  and a low frequency filter of $RemoveB'1Hz'lGeXtHqo$ . EEG data were recorded continuously and digitally stored. Description: The posterior dominant rhythm consists of 8-9 Hz activity of moderate voltage (25-35 uV) seen predominantly in posterior head regions, symmetric and reactive to eye opening and eye closing. Hyperventilation and  photic stimulation were not performed.   IMPRESSION: This study is within normal limits. No seizures or epileptiform discharges were seen throughout the recording. Priyanka Barbra Sarks    Medications: . sodium chloride 10 mL/hr at 02/07/20 1511  . piperacillin-tazobactam (ZOSYN)  IV 2.25 g (02/11/20 0450)   . Chlorhexidine Gluconate Cloth  6 each Topical Q0600  . gabapentin  300 mg Oral BID  . heparin sodium (porcine)      . hydrALAZINE  50 mg Oral BID  . influenza vac split quadrivalent PF  0.5 mL Intramuscular Tomorrow-1000  . pantoprazole  40 mg Oral Q0600    [facility]: [facility] 180[facility]Re, 4 hours, BFR 400/DFR 800, EDW 97.5kg, 2K/2Ca, UF profile 2, TDC mircera 281mcg q 2 weeks- last dose 02/05/20  Assessment/Plan: 1. Pericolonic abscess: On zosyn.No surgical intervention planned.GI consulted.  CT yesterday showed decrease in size of abscess. Plan for colonoscopy vs flex sig in 6 weeks as OP.  2. Acute lower GI bleed: Secondary to #1 above. Received 1 unit PRBC. Hgb 7.9 today. Recently received max dose of ESA. Transfuse PRN. 3. ESRD:Usually PD patient, recently transitioned to HD due to  feeling poorly/insufficient clearances with PD. She is agreeable to continue HD for now. On MWF schedule. HD Friday and Saturday post MRI with contrast.  Next HD 02/12/20.  Will need to flush PD cath weekly to maintain patency. 4. Hypokalemia:Resolved. K 3.6 today. Using high K+ baths with HD. 5. Hypertension/volume:BP improved post HD.no edema on exam.UF with HD as tolerated. Continue home BP meds. Please avoid additional IVF as much as possible. 6. Metabolic bone disease:Calciumat goal. Not on VDRA. Resume binders once tolerating PO 7. Rapid eye fluttering/deviation - neruo consulted.  MRI with & w/o contrast yesterday prior to HD with no acute abnormalities.   EEG normal.  Plan for additional work up as outpatient.    Jen Mow, PA-C [facility] 02/11/2020,9:38 AM  LOS: 5 days   I have seen and examined this patient and agree with plan and assessment in the above note with renal recommendations/intervention highlighted.  Broadus John A Christropher Gintz,MD 02/11/2020 12:04 PM

## 2020-02-11 NOTE — Progress Notes (Signed)
[facility] Progress Note  Patient: Patricia [name]    JSH:[id number]  DOA: 02/05/2020     Date of Service: the patient was seen and examined on 02/11/2020  Brief hospital course: Past medical history of ESRD on HD MWF, HTN, anxiety, recurrent GI bleed.  Presents with complaints of abdominal pain and GI bleed found to have pericolonic abscess as well as labial abscess currently on IV antibiotics. For GI bleed, GI was consulted.  Conservative measures recommended.  Currently signed off General surgery following for pericolonic abscess.  Currently on IV antibiotics. Nephrology consulted for HD. Currently plan is continue IV antibiotics monitor WBC as it is rising.  Assessment and Plan: 1.  Suspected diverticulitis with pericolonic abscess between the rectum and sigmoid. Labial abscess CT on 2/1 shows fluid collection posterior to sigmoid. With leukocytosis on admission. IR was consulted, abscess not amenable to drain. Patient was started on IV Zosyn. Diet was advanced but patient continues to have some abdominal pain. Tells me that she has BM every time she tries to eat. WBC count has a mild uptrend in the last 48 hours.  We will continue to monitor. Initiating heparin for DVT prophylaxis per general surgery. Continue IV antibiotics until improvement in leukocytosis. Repeat CT scan in 3 to 4 weeks. Colonoscopy in 6 weeks.  2.  GI bleed Appreciate GI assistance, currently signed off. Outpatient follow-up recommended. Treated conservatively.  GI bleed currently resolved. No plan for endoscopic evaluation at this time. Colonoscopy evaluation via colonoscopy or flexible sigmoidoscopy in 6 weeks for her pericolonic abscess. Patient has seen [facility], [facility] in the past.  Seen by [facility] during this admission.  3.  ESRD on MWF HD Initially was on PD.  Currently transition to HD due to poor clearance. Management of HD per nephrology. Flush PD catheter weekly to maintain  patency. Electrolyte and volume management per nephrology via HD.  4.  Rapid eye fluttering Neurology was consulted. 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 MRI did not show any acute abnormality. B12 normal. EEG showed no evidence of active seizures. Outpatient work-up recommended with neurology.  No further work-up or treatment in the hospital.  5.  Chronic neuropathy On gabapentin. Continue.  6.  HTN Blood pressure stable. Continue current regimen.  7.  HLD Continue statin.  8.  Obesity. Placing the patient at high risk of poor outcome.  Monitor. Body mass index is 35.22 kg/m.   Diet: Renal diet full liquid DVT Prophylaxis:   heparin injection 5,000 Units Start: 02/11/20 1430 SCDs Start: 02/06/20 8502    Advance goals of care discussion: Full code  Family Communication: no family was present at bedside, at the time of interview.   Disposition:  Status is: Inpatient  Remains inpatient appropriate because:Ongoing active pain requiring inpatient pain management   Dispo: The patient is from: Home              Anticipated d/c is to: Home              Anticipated d/c date is: 3 days              Patient currently is not medically stable to d/c.   Difficult to place patient         Subjective: Continues to have abdominal pain.  No nausea no vomiting.  Tells me that every time she has something to eat she has a bowel movement.  No blood in the stool reported.  Physical Exam:  General: Appear in mild distress, no Rash; Oral Mucosa Clear, moist. no Abnormal Neck Mass Or lumps, Conjunctiva normal  Cardiovascular: S1 and S2 Present, no Murmur, Respiratory: good respiratory effort, Bilateral Air entry present and CTA, no Crackles, no wheezes Abdomen: Bowel Sound present, Soft and mild diffuse tenderness Extremities: no Pedal edema Neurology: alert and oriented to time, place, and person affect appropriate. no new focal deficit Gait not checked due to  patient safety concerns    Vitals:   02/11/20 0400 02/11/20 0401 02/11/20 0718 02/11/20 1400  BP: (!) 113/59 102/71 123/78 116/83  Pulse: 100 (!) 103 (!) 108 98  Resp: $Remo'18 16 18 18  'XDdkp$ Temp: 98.1 F (36.7 C)  98.2 F (36.8 C) 98.8 F (37.1 C)  TempSrc: Oral  Oral Oral  SpO2: 98%  98% 99%  Weight:      Height:        Intake/Output Summary (Last 24 hours) at 02/11/2020 1933 Last data filed at 02/11/2020 0400 Gross per 24 hour  Intake --  Output 2034 ml  Net -2034 ml   Filed Weights   02/08/20 0315 02/09/20 0748 02/09/20 1035  Weight: 101.6 kg 104.9 kg 102 kg    Data Reviewed: I have personally reviewed and interpreted daily labs, tele strips, imaging. I reviewed all nursing notes, pharmacy notes, vitals, pertinent old records I have discussed plan of care as described above with RN and patient/family.  CBC: Recent Labs  Lab 02/06/20 0835 02/06/20 1627 02/07/20 0702 02/07/20 1834 02/08/20 0454 02/09/20 0342 02/10/20 0228 02/11/20 0141  WBC 13.8*   < > 13.3*  --  13.2* 10.8* 11.8* 12.1*  NEUTROABS 10.8*  --   --   --   --   --   --   --   HGB 7.3*   < > 7.1* 7.1* 7.5* 7.5* 8.2* 7.9*  HCT 23.3*   < > 21.9* 20.5* 23.2* 23.8* 26.4* 25.5*  MCV 87.9   < > 86.2  --  87.9 88.5 88.3 90.1  PLT 259   < > 279  --  298 341 357 387   < > = values in this interval not displayed.   Basic Metabolic Panel: Recent Labs  Lab 02/07/20 0702 02/08/20 0454 02/08/20 2105 02/09/20 0342 02/10/20 0228 02/11/20 0141  NA 134* 135  --  138 134* 134*  K 3.6 3.8  --  4.0 3.7 3.6  CL 91* 95*  --  98 94* 94*  CO2 24 24  --  $R'22 23 22  'FW$ GLUCOSE 87 94  --  77 89 86  BUN 34* 15  --  21* 13 21*  CREATININE 11.67* 6.80*  --  9.96* 7.76* 9.83*  CALCIUM 9.3 8.8*  --  9.7 9.8 10.1  MG  --   --  2.0  --   --   --     Studies: No results found.  Scheduled Meds: . Chlorhexidine Gluconate Cloth  6 each Topical Q0600  . gabapentin  300 mg Oral BID  . heparin injection (subcutaneous)  5,000 Units  Subcutaneous Q8H  . hydrALAZINE  50 mg Oral BID  . influenza vac split quadrivalent PF  0.5 mL Intramuscular Tomorrow-1000  . pantoprazole  40 mg Oral Q0600   Continuous Infusions: . sodium chloride 10 mL/hr at 02/07/20 1511  . piperacillin-tazobactam (ZOSYN)  IV 2.25 g (02/11/20 1333)   PRN Meds: sodium chloride, acetaminophen **OR** acetaminophen, ALPRAZolam, hydrOXYzine, labetalol, LORazepam, ondansetron **OR** ondansetron (ZOFRAN) IV, traMADol  Time  spent: 35 minutes  Author: Berle Mull, MD [facility] 02/11/2020 7:33 PM  To reach On-call, see care teams to locate the attending and reach out via [email or web address]. Between 7PM-7AM, please contact night-coverage If you still have difficulty reaching the attending provider, please page the [facility] (Director on Call) for [facility] on amion for assistance.

## 2020-02-11 NOTE — Progress Notes (Signed)
Pt. Pd catheter flushed and drained 1000 ml. Pt stable

## 2020-02-11 NOTE — Progress Notes (Signed)
Subjective/Chief Complaint: Still complains of some mild abd pain. Wbc 12. No fever   Objective: Vital signs in last 24 hours: Temp:  [98.1 F (36.7 C)-98.7 F (37.1 C)] 98.2 F (36.8 C) (02/06 0718) Pulse Rate:  [90-108] 108 (02/06 0718) Resp:  [16-18] 18 (02/06 0718) BP: (102-174)/(59-101) 123/78 (02/06 0718) SpO2:  [98 %-99 %] 98 % (02/06 0718) Last BM Date: 02/10/20  Intake/Output from previous day: 02/05 0701 - 02/06 0700 In: 360 [P.O.:360] Out: 2034  Intake/Output this shift: No intake/output data recorded.  General appearance: alert and cooperative Resp: clear to auscultation bilaterally Cardio: regular rate and rhythm GI: soft, mild lower abd tenderness  Lab Results:  Recent Labs    02/10/20 0228 02/11/20 0141  WBC 11.8* 12.1*  HGB 8.2* 7.9*  HCT 26.4* 25.5*  PLT 357 387   BMET Recent Labs    02/10/20 0228 02/11/20 0141  NA 134* 134*  K 3.7 3.6  CL 94* 94*  CO2 23 22  GLUCOSE 89 86  BUN 13 21*  CREATININE 7.76* 9.83*  CALCIUM 9.8 10.1   PT/INR No results for input(s): LABPROT, INR in the last 72 hours. ABG No results for input(s): PHART, HCO3 in the last 72 hours.  Invalid input(s): PCO2, PO2  Studies/Results: CT ABDOMEN PELVIS W CONTRAST  Result Date: 02/09/2020 CLINICAL DATA:  Intra-abdominal abscess, abdominal pain for 1 week; history end-stage renal disease on peritoneal dialysis, hypertension EXAM: CT ABDOMEN AND PELVIS WITH CONTRAST TECHNIQUE: Multidetector CT imaging of the abdomen and pelvis was performed using the standard protocol following bolus administration of intravenous contrast. Sagittal and coronal MPR images reconstructed from axial data set. No oral contrast. CONTRAST:  71mL OMNIPAQUE IOHEXOL 300 MG/ML  SOLN IV COMPARISON:  02/06/2020 FINDINGS: Lower chest: Minimal subsegmental atelectasis in the lower lobes. Hepatobiliary: Mildly increased density dependently in gallbladder likely vicarious excretion of contrast slightly  increased from previous exam. Gallbladder and liver otherwise normal appearance Pancreas: Normal appearance Spleen: Normal appearance. Splenule adjacent to inferior spleen 2 cm diameter. Adrenals/Urinary Tract: Adrenal glands normal appearance. BILATERAL renal cortical thinning and small cysts. No additional mass, hydronephrosis, hydroureter, or ureteral calcification. Bladder unremarkable. Stomach/Bowel: Stomach decompressed. Appendix filled with contrast. No evidence of appendicitis. Bowel wall thickening of sigmoid colon with few scattered sigmoid diverticula seen, likely reflecting diverticulitis. Adjacent thickening of the rectal wall. Small fluid collection between the sigmoid colon and rectum 16 x 15 mm consistent with small pericolic diverticular abscess decreased from the 3.3 x 2.3 cm on the prior study. No new abscess collections. Vascular/Lymphatic: Atherosclerotic calcifications aorta and iliac arteries. Multiple pelvic phleboliths. No adenopathy. Few normal sized retroperitoneal lymph nodes noted. Reproductive: Uterus surgically absent.  Unremarkable ovaries. Other: Peritoneal dialysis catheter in pelvis. No free fluid or free air. Umbilical hernia containing fat. Small supraumbilical ventral hernia containing fat. Musculoskeletal: Unremarkable IMPRESSION: Persistent wall thickening of the sigmoid colon and rectum with surrounding infiltrative changes consistent with diverticulitis. Decreased size of a small pericolic diverticular abscess collection, now 16 x 15 mm. Peritoneal dialysis catheter in pelvis. Umbilical and supraumbilical ventral hernias containing fat. Aortic Atherosclerosis (ICD10-I70.0). Electronically Signed   By: Lavonia Dana M.D.   On: 02/09/2020 14:04   EEG adult  Result Date: 02/10/2020 Lora Havens, MD     02/10/2020  9:03 AM [name] MRN: [id number] Epilepsy Attending: Lora Havens Referring Physician/Provider: Anibal Henderson, NP Date:  02/09/2020 Duration: 30.04 mins Patient history: 45 year old female with complains of rapid eyelid  fluttering with associated rapid right upward eye deviation which has progressively worsened over the past two months which is now impairing her function and vision. EEG to evaluate for seizure Level of alertness: Awake AEDs during EEG study: GBP Technical aspects: This EEG study was done with scalp electrodes positioned according to the 10-20 International system of electrode placement. Electrical activity was acquired at a sampling rate of $Remov'500Hz'tKucSX$  and reviewed with a high frequency filter of $RemoveB'70Hz'xMBIVjFq$  and a low frequency filter of $RemoveB'1Hz'TfWisdsJ$ . EEG data were recorded continuously and digitally stored. Description: The posterior dominant rhythm consists of 8-9 Hz activity of moderate voltage (25-35 uV) seen predominantly in posterior head regions, symmetric and reactive to eye opening and eye closing. Hyperventilation and photic stimulation were not performed.   IMPRESSION: This study is within normal limits. No seizures or epileptiform discharges were seen throughout the recording. Priyanka Barbra Sarks    Anti-infectives: Anti-infectives (From admission, onward)   Start     Dose/Rate Route Frequency Ordered Stop   02/06/20 2000  piperacillin-tazobactam (ZOSYN) IVPB 3.375 g  Status:  Discontinued       Note to Pharmacy: Zosyn 3.375 g IV q12h in ESRD on HD   3.375 g 12.5 mL/hr over 240 Minutes Intravenous Every 12 hours 02/06/20 0352 02/06/20 0806   02/06/20 1200  piperacillin-tazobactam (ZOSYN) IVPB 2.25 g       Note to Pharmacy: Zosyn 3.375 g IV q12h in ESRD on HD   2.25 g 100 mL/hr over 30 Minutes Intravenous Every 8 hours 02/06/20 0806     02/06/20 0245  piperacillin-tazobactam (ZOSYN) IVPB 3.375 g        3.375 g 100 mL/hr over 30 Minutes Intravenous  Once 02/06/20 0238 02/06/20 0415      Assessment/Plan: s/p * No surgery found * Continue liquids until abd pain resolves  Continue IV abx until wbc  normalizes Pericolonic abscess smaller on recent CT May need to consult GYN if labial swelling persists End-stage renal disease (PD >> HD MWF) GI bleed -? Diverticular related Hypertension Acute on chronic anemia Possible Right labial abscess - consider GYN cosult - Defer to [facility] -   Suspected diverticulitis with pericolonic abscess between the rectum and sigmoid - CT 2/1 w/ Changes of diverticulosis with a fluid collection posterior to the sigmoid and anterior to the rectum as described suspicious for small pericolonic abscess likely arising from the posterior wall of the sigmoid colon. - Repeat CT 2/4 w/ persistent wall thickening of the sigmoid colon and rectum with surrounding infiltrative changes consistent with diverticulitis. Decreased size of a small pericolic diverticular abscess collection, now measuring 16 x 15 mm. Appears to small to drain - WBC 13.8>> 14.8>> 13.3>> 13.2>>10.8>>11.8 - Cont IV abx  - Allow FLD given having less pain today and minimal tenderness on exam.  - Repeat CBC in the AM - Hopefully will improve with conservative therapies and be able to avoid surgery during admission.   FEN: FLD ID: Zosyn 2/1 >> day 4. WBC as above. Afebrile  DVT: okay for chemical prophylaxis from a general surgery standpoint  Follow-up: TBD  LOS: 5 days    Autumn Messing III 02/11/2020

## 2020-02-12 DIAGNOSIS — K630 Abscess of intestine: Secondary | ICD-10-CM | POA: Diagnosis not present

## 2020-02-12 LAB — RENAL FUNCTION PANEL
Albumin: 2.8 g/dL — ABNORMAL LOW (ref 3.5–5.0)
Anion gap: 18 — ABNORMAL HIGH (ref 5–15)
BUN: 18 mg/dL (ref 6–20)
CO2: 22 mmol/L (ref 22–32)
Calcium: 10.2 mg/dL (ref 8.9–10.3)
Chloride: 97 mmol/L — ABNORMAL LOW (ref 98–111)
Creatinine, Ser: 9.16 mg/dL — ABNORMAL HIGH (ref 0.44–1.00)
GFR, Estimated: 5 mL/min — ABNORMAL LOW (ref >60)
Glucose, Bld: 92 mg/dL (ref 70–99)
Phosphorus: 8.1 mg/dL — ABNORMAL HIGH (ref 2.5–4.6)
Potassium: 3.8 mmol/L (ref 3.5–5.1)
Sodium: 137 mmol/L (ref 135–145)

## 2020-02-12 LAB — CBC
HCT: 26.5 % — ABNORMAL LOW (ref 36.0–46.0)
Hemoglobin: 8.5 g/dL — ABNORMAL LOW (ref 12.0–15.0)
MCH: 28.4 pg (ref 26.0–34.0)
MCHC: 32.1 g/dL (ref 30.0–36.0)
MCV: 88.6 fL (ref 80.0–100.0)
Platelets: 396 10*3/uL (ref 150–400)
RBC: 2.99 MIL/uL — ABNORMAL LOW (ref 3.87–5.11)
RDW: 14.9 % (ref 11.5–15.5)
WBC: 12 10*3/uL — ABNORMAL HIGH (ref 4.0–10.5)
nRBC: 1.3 % — ABNORMAL HIGH (ref 0.0–0.2)

## 2020-02-12 MED ORDER — HEPARIN SODIUM (PORCINE) 1000 UNIT/ML IJ SOLN
INTRAMUSCULAR | Status: AC
  Administered 2020-02-12: 3200 [IU]
  Filled 2020-02-12: qty 4

## 2020-02-12 NOTE — Progress Notes (Signed)
[facility] Progress Note  Patient: Patricia [name]    ZOX:[id number]  DOA: 02/05/2020     Date of Service: the patient was seen and examined on 02/12/2020  Brief hospital course: Past medical history of ESRD on HD MWF, HTN, anxiety, recurrent GI bleed.  Presents with complaints of abdominal pain and GI bleed found to have pericolonic abscess as well as labial abscess currently on IV antibiotics. For GI bleed, GI was consulted.  Conservative measures recommended.  Currently signed off General surgery following for pericolonic abscess.  Currently on IV antibiotics. Nephrology consulted for HD. Currently plan is continue IV antibiotics.  Follow-up on GYN recommendation for labial abscess.  Assessment and Plan: 1.  Suspected diverticulitis with pericolonic abscess between the rectum and sigmoid. Labial abscess CT on 2/1 shows fluid collection posterior to sigmoid. With leukocytosis on admission. IR was consulted, abscess not amenable to drain. Patient was started on IV Zosyn. Diet was advanced but patient continues to have some abdominal pain.  So now back to clear liquid diet only. Tells me that she has BM every time she tries to eat. WBC count has a mild uptrend in the last 48 hours.  We will continue to monitor. Initiating heparin for DVT prophylaxis per general surgery. Continue IV antibiotics until improvement in leukocytosis. Repeat CT scan in 3 to 4 weeks. Colonoscopy in 6 weeks. GYN consulted for labial abscess.  2.  GI bleed Appreciate GI assistance, currently signed off. Outpatient follow-up recommended. Treated conservatively.  GI bleed currently resolved. No plan for endoscopic evaluation at this time. Colonoscopy evaluation via colonoscopy or flexible sigmoidoscopy in 6 weeks for her pericolonic abscess. Patient has seen [facility], [facility] in the past.  Seen by [facility] during this admission.  3.  ESRD on MWF HD Initially was on PD.  Currently transition  to HD due to poor clearance. Management of HD per nephrology. Flush PD catheter weekly to maintain patency. Electrolyte and volume management per nephrology via HD.  4.  Rapid eye fluttering Neurology was consulted. 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 MRI did not show any acute abnormality. B12 normal. EEG showed no evidence of active seizures. Outpatient work-up recommended with neurology.  No further work-up or treatment in the hospital.  5.  Chronic neuropathy On gabapentin. Continue.  6.  HTN Blood pressure stable. Continue current regimen.  7.  HLD Continue statin.  8.  Obesity. Placing the patient at high risk of poor outcome.  Monitor. Body mass index is 33.22 kg/m.   Diet: Clear liquid diet DVT Prophylaxis:   heparin injection 5,000 Units Start: 02/11/20 1430 SCDs Start: 02/06/20 8119   Advance goals of care discussion: Full code  Family Communication: no family was present at bedside, at the time of interview.   Disposition:  Status is: Inpatient  Remains inpatient appropriate because:Ongoing active pain requiring inpatient pain management monitor for improvement in clinical symptoms of abdominal pain and labial abscess.  Dispo: The patient is from: Home              Anticipated d/c is to: Home              Anticipated d/c date is: 3 days              Patient currently is not medically stable to d/c.   Difficult to place patient   Subjective: Continues to have abdominal pain as well as nausea.  No vomiting.  Continues to have diarrhea  with food.  No fever no chills.  Physical Exam:  General: Appear in mild distress, no Rash; Oral Mucosa Clear, moist. no Abnormal Neck Mass Or lumps, Conjunctiva normal  Cardiovascular: S1 and S2 Present, no Murmur, Respiratory: good respiratory effort, Bilateral Air entry present and CTA, no Crackles, no wheezes Abdomen: Bowel Sound present, Soft and diffuse mild tenderness Extremities: no Pedal  edema Neurology: alert and oriented to time, place, and person affect appropriate. no new focal deficit, continues bilateral rapid eye fluttering Gait not checked due to patient safety concerns  Vitals:   02/12/20 1345 02/12/20 1400 02/12/20 1406 02/12/20 1447  BP: 116/66 117/75 120/68 111/73  Pulse: 96 94 99 98  Resp:   18 18  Temp:   97.9 F (36.6 C) 98.6 F (37 C)  TempSrc:   Oral Oral  SpO2:   99%   Weight:   96.2 kg   Height:        Intake/Output Summary (Last 24 hours) at 02/12/2020 1849 Last data filed at 02/12/2020 1406 Gross per 24 hour  Intake --  Output 671 ml  Net -671 ml   Filed Weights   02/09/20 1035 02/12/20 1125 02/12/20 1406  Weight: 102 kg 97 kg 96.2 kg    Data Reviewed: I have personally reviewed and interpreted daily labs, tele strips, imaging. I reviewed all nursing notes, pharmacy notes, vitals, pertinent old records I have discussed plan of care as described above with RN and patient/family.  CBC: Recent Labs  Lab 02/06/20 0835 02/06/20 1627 02/08/20 0454 02/09/20 0342 02/10/20 0228 02/11/20 0141 02/12/20 0154  WBC 13.8*   < > 13.2* 10.8* 11.8* 12.1* 12.0*  NEUTROABS 10.8*  --   --   --   --   --   --   HGB 7.3*   < > 7.5* 7.5* 8.2* 7.9* 8.5*  HCT 23.3*   < > 23.2* 23.8* 26.4* 25.5* 26.5*  MCV 87.9   < > 87.9 88.5 88.3 90.1 88.6  PLT 259   < > 298 341 357 387 396   < > = values in this interval not displayed.   Basic Metabolic Panel: Recent Labs  Lab 02/08/20 0454 02/08/20 2105 02/09/20 0342 02/10/20 0228 02/11/20 0141 02/12/20 1140  NA 135  --  138 134* 134* 137  K 3.8  --  4.0 3.7 3.6 3.8  CL 95*  --  98 94* 94* 97*  CO2 24  --  $R'22 23 22 22  'bz$ GLUCOSE 94  --  77 89 86 92  BUN 15  --  21* 13 21* 18  CREATININE 6.80*  --  9.96* 7.76* 9.83* 9.16*  CALCIUM 8.8*  --  9.7 9.8 10.1 10.2  MG  --  2.0  --   --   --   --   PHOS  --   --   --   --   --  8.1*    Studies: No results found.  Scheduled Meds: . Chlorhexidine Gluconate  Cloth  6 each Topical Q0600  . gabapentin  300 mg Oral BID  . heparin injection (subcutaneous)  5,000 Units Subcutaneous Q8H  . influenza vac split quadrivalent PF  0.5 mL Intramuscular Tomorrow-1000  . pantoprazole  40 mg Oral Q0600   Continuous Infusions: . sodium chloride 10 mL/hr at 02/07/20 1511  . piperacillin-tazobactam (ZOSYN)  IV 2.25 g (02/12/20 1503)   PRN Meds: sodium chloride, acetaminophen **OR** acetaminophen, ALPRAZolam, hydrOXYzine, labetalol, LORazepam, ondansetron **OR** ondansetron (ZOFRAN) IV,  traMADol  Time spent: 35 minutes  Author: Berle Mull, MD [facility] 02/12/2020 6:49 PM  To reach On-call, see care teams to locate the attending and reach out via [email or web address]. Between 7PM-7AM, please contact night-coverage If you still have difficulty reaching the attending provider, please page the [facility] (Director on Call) for [facility] on amion for assistance.

## 2020-02-12 NOTE — Progress Notes (Addendum)
Subjective: CC: Patient reports that she has some suprapubic and LLQ abdominal pain that is worse anytime she tries to eat and is associated with nausea. She notes that she has loose/liquid stool shortly after drinking/eating. No flatus. Having some right labial pain. RN present to chaperone exam.   Objective: Vital signs in last 24 hours: Temp:  [97.9 F (36.6 C)-98.8 F (37.1 C)] 97.9 F (36.6 C) (02/07 1610) Pulse Rate:  [92-100] 92 (02/07 0638) Resp:  [18] 18 (02/07 0638) BP: (101-150)/(57-94) 101/57 (02/07 0638) SpO2:  [99 %-100 %] 99 % (02/07 9604) Last BM Date: 02/11/20  Intake/Output from previous day: No intake/output data recorded. Intake/Output this shift: No intake/output data recorded.  PE: Gen: Awake and alert, NAD Resp: normal rate and effort  GI: Obese, soft, tenderness in the suprapubic left lower quadrant. No guarding or rigidity.Positive bowel sounds. PD catheter with dressing overlying. There is a stitch along her suprapubic abdomen - unclear what this is from, patient believes it is from when she had the PD catheter placed?   GU: Chaperone, RN Aisha present. Patient with swelling on the right labia with a ~3cm area of fluctuance on the inferior fold of the lateral labia. There is mild surrounding induration superior. No drainage.   Lab Results:  Recent Labs    02/11/20 0141 02/12/20 0154  WBC 12.1* 12.0*  HGB 7.9* 8.5*  HCT 25.5* 26.5*  PLT 387 396   BMET Recent Labs    02/10/20 0228 02/11/20 0141  NA 134* 134*  K 3.7 3.6  CL 94* 94*  CO2 23 22  GLUCOSE 89 86  BUN 13 21*  CREATININE 7.76* 9.83*  CALCIUM 9.8 10.1   PT/INR No results for input(s): LABPROT, INR in the last 72 hours. CMP     Component Value Date/Time   NA 134 (L) 02/11/2020 0141   K 3.6 02/11/2020 0141   CL 94 (L) 02/11/2020 0141   CO2 22 02/11/2020 0141   GLUCOSE 86 02/11/2020 0141   BUN 21 (H) 02/11/2020 0141   CREATININE 9.83 (H) 02/11/2020 0141   CALCIUM  10.1 02/11/2020 0141   PROT 7.2 02/05/2020 1850   ALBUMIN 2.8 (L) 02/05/2020 1850   AST 18 02/05/2020 1850   ALT 13 02/05/2020 1850   ALKPHOS 59 02/05/2020 1850   BILITOT 0.7 02/05/2020 1850   GFRNONAA 5 (L) 02/11/2020 0141   GFRAA 4 (L) 05/14/2019 2254   Lipase     Component Value Date/Time   LIPASE 34 05/11/2019 0515       Studies/Results: No results found.  Anti-infectives: Anti-infectives (From admission, onward)   Start     Dose/Rate Route Frequency Ordered Stop   02/06/20 2000  piperacillin-tazobactam (ZOSYN) IVPB 3.375 g  Status:  Discontinued       Note to Pharmacy: Zosyn 3.375 g IV q12h in ESRD on HD   3.375 g 12.5 mL/hr over 240 Minutes Intravenous Every 12 hours 02/06/20 0352 02/06/20 0806   02/06/20 1200  piperacillin-tazobactam (ZOSYN) IVPB 2.25 g       Note to Pharmacy: Zosyn 3.375 g IV q12h in ESRD on HD   2.25 g 100 mL/hr over 30 Minutes Intravenous Every 8 hours 02/06/20 0806     02/06/20 0245  piperacillin-tazobactam (ZOSYN) IVPB 3.375 g        3.375 g 100 mL/hr over 30 Minutes Intravenous  Once 02/06/20 0238 02/06/20 0415       Assessment/Plan End-stage renal disease (PD >>  HD MWF) - ? If stitch from prior surgery. Will discuss with MD and likely remove as area is healed.  GI bleed -? Diverticular related. GI has seen Hypertension Acute on chronic anemia Right labial abscess - given location, would recommend a GYN consult - Defer to [facility] -   Suspected diverticulitis with pericolonic abscess between the rectum and sigmoid - CT 2/1 w/ Changes of diverticulosis with a fluid collection posterior to the sigmoid and anterior to the rectum as described suspicious for small pericolonic abscess likely arising from the posterior wall of the sigmoid colon. - Repeat CT 2/4 w/ persistent wall thickening of the sigmoid colon and rectum with surrounding infiltrative changes consistent with diverticulitis. Decreased size of a small pericolic diverticular abscess  collection, now measuring 16 x 15 mm. Appears to small to drain - WBC 13.8>> 14.8>> 13.3>> 13.2>>10.8>>11.8>>12.1>>12.0 - Cont IV abx  - Back down to sips with increased pain and nausea with liquids yesterday - Repeat CBC in the AM - Hopefully will improve with conservative therapies and be able to avoid surgery during admission.   FEN: Sips/chips ID: Zosyn 2/1 >> day 7. WBC as above. Afebrile  DVT: SCDs, heparin subq Follow-up: TBD   LOS: 6 days    Jillyn Ledger , [facility] 02/12/2020, 9:14 AM Please see Amion for pager number during day hours 7:00am-4:30pm

## 2020-02-12 NOTE — Consult Note (Addendum)
Reason for Consult:possible right labial abscess Referring Physician: Lavina Hamman, MD Patricia [name] is an 45 y.o. female. G3P3  Patient is on medicine service with diverticulitis, GI  Bleed, ESRD and we were asked to assess possible right labial abscess, This was described as 1.5 cm and fluctuant, no labial abscess was seen on pelvic CT. She states 3 months ago she had a larger swelling on that side that spontaneously drained. Her family doctor recommended a Gyn at [facility] but she has not been there.   Pertinent Gynecological History: Menses: post-menopausal and due to LAVH 2010 Bleeding: none Contraception: status post hysterectomy  Previous GYN Procedures: LAVH     Menstrual History: No LMP recorded. Patient has had a hysterectomy.    Past Medical History:  Diagnosis Date  . Acid reflux   . Anemia 04/2019   REQUIRING BLOOD TRANSFUSIONS  . Anxiety   . Arthritis   . Dyspnea   . Dysrhythmia    tachycardia  . ESRD (end stage renal disease) ([place])    [facility]THSAT- [place]  . GI bleeding 12/2017  . Gout   . Headache(784.0)    "related to chemo; sometimes weekly" (09/12/2013)  . High cholesterol   . History of blood transfusion "a couple"   "related to low counts"  . Hypertension   . Iron deficiency anemia    "get epogen shots q month" (02/20/2014)  . 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 Peritoneal dialysis status ([place])   . Stroke [facility]) 08/15/2017    Past Surgical History:  Procedure Laterality Date  . ABDOMINAL HYSTERECTOMY  2010   "laparoscopic"  . ANKLE FRACTURE SURGERY Right 1994  . AV FISTULA PLACEMENT Left 04/14/2016   Procedure: ARTERIOVENOUS (AV) FISTULA CREATION;  Surgeon: Angelia Mould, MD;  Location: [facility] OR;  Service: Vascular;  Laterality: Left;  . AV FISTULA PLACEMENT Left 08/09/2017   Procedure: INSERTION OF ARTERIOVENOUS (AV) GORE-TEX GRAFT LEFT ARM;  Surgeon: Elam Dutch,  MD;  Location: [place];  Service: Vascular;  Laterality: Left;  . [employer] REMOVAL Left 08/11/2017   Procedure: REMOVAL OF ARTERIOVENOUS GORETEX GRAFT ([place]);  Surgeon: Serafina Mitchell, MD;  Location: [facility] OR;  Service: Vascular;  Laterality: Left;  . BIOPSY  09/03/2017   Procedure: BIOPSY;  Surgeon: Otis Brace, MD;  Location: [place];  Service: Gastroenterology;;  . BIOPSY  12/24/2017   Procedure: BIOPSY;  Surgeon: Irving Copas., MD;  Location: [place];  Service: Gastroenterology;;  . CARDIAC CATHETERIZATION  2000's  . COLONOSCOPY WITH PROPOFOL N/A 09/04/2017   Procedure: COLONOSCOPY WITH PROPOFOL;  Surgeon: Ronnette Juniper, MD;  Location: [place];  Service: Gastroenterology;  Laterality: N/A;  . ENTEROSCOPY N/A 12/24/2017   Procedure: ENTEROSCOPY;  Surgeon: Rush Landmark Telford Nab., MD;  Location: [place];  Service: Gastroenterology;  Laterality: N/A;  . ESOPHAGOGASTRODUODENOSCOPY    . ESOPHAGOGASTRODUODENOSCOPY (EGD) WITH PROPOFOL Left 06/04/2017   Procedure: ESOPHAGOGASTRODUODENOSCOPY (EGD) WITH PROPOFOL;  Surgeon: Arta Silence, MD;  Location: [place];  Service: Endoscopy;  Laterality: Left;  . ESOPHAGOGASTRODUODENOSCOPY (EGD) WITH PROPOFOL N/A 09/03/2017   Procedure: ESOPHAGOGASTRODUODENOSCOPY (EGD) WITH PROPOFOL;  Surgeon: Otis Brace, MD;  Location: [place];  Service: Gastroenterology;  Laterality: N/A;  . FISTULA SUPERFICIALIZATION Left 04/02/2017   Procedure: FISTULA SUPERFICIALIZATION LEFT ARM;  Surgeon: Serafina Mitchell, MD;  Location: [place];  Service: Vascular;  Laterality: Left;  . FISTULOGRAM Left 04/07/2016   Procedure: FISTULOGRAM;  Surgeon:  Waynetta Sandy, MD;  Location: [place];  Service: Vascular;  Laterality: Left;  . FRACTURE SURGERY     right ankle  . GIVENS CAPSULE STUDY N/A 10/29/2017   Procedure: GIVENS CAPSULE STUDY;  Surgeon: Wilford Corner, MD;  Location: [place];  Service: Endoscopy;  Laterality: N/A;  . GIVENS  CAPSULE STUDY N/A 12/24/2017   Procedure: GIVENS CAPSULE STUDY;  Surgeon: Irving Copas., MD;  Location: [place];  Service: Gastroenterology;  Laterality: N/A;  . GIVENS CAPSULE STUDY N/A 05/22/2018   Procedure: GIVENS CAPSULE STUDY;  Surgeon: Irving Copas., MD;  Location: [place];  Service: Gastroenterology;  Laterality: N/A;  . INSERTION OF DIALYSIS CATHETER Right 04/07/2016   Procedure: INSERTION OF DIALYSIS CATHETER;  Surgeon: Waynetta Sandy, MD;  Location: [place];  Service: Vascular;  Laterality: Right;  . INSERTION OF DIALYSIS CATHETER Right 04/04/2017   Procedure: INSERTION OF DIALYSIS CATHETER;  Surgeon: Waynetta Sandy, MD;  Location: [place];  Service: Vascular;  Laterality: Right;  . LEFT HEART CATH AND CORONARY ANGIOGRAPHY N/A 05/12/2019   Procedure: LEFT HEART CATH AND CORONARY ANGIOGRAPHY;  Surgeon: Dixie Dials, MD;  Location: [place] [facility];  Service: Cardiovascular;  Laterality: N/A;  . LIGATION OF ARTERIOVENOUS  FISTULA Left 04/14/2016   Procedure: LIGATION OF ARTERIOVENOUS  FISTULA;  Surgeon: Angelia Mould, MD;  Location: [place];  Service: Vascular;  Laterality: Left;  . PATCH ANGIOPLASTY Left 06/19/2016   Procedure: PATCH ANGIOPLASTY LEFT ARTERIOVENOUS FISTULA;  Surgeon: Angelia Mould, MD;  Location: [place];  Service: Vascular;  Laterality: Left;  . POLYPECTOMY  09/04/2017   Procedure: POLYPECTOMY;  Surgeon: Ronnette Juniper, MD;  Location: [facility] ENDOSCOPY;  Service: Gastroenterology;;  . REVISON OF ARTERIOVENOUS FISTULA Left 06/19/2016   Procedure: REVISION SUPERFICIALIZATION OF BRACHIOCEPHALIC ARTERIOVENOUS FISTULA;  Surgeon: Angelia Mould, MD;  Location: [facility] OR;  Service: Vascular;  Laterality: Left;    Family History  Problem Relation Age of Onset  . Hypertension Mother   . Thyroid disease Mother   . Coronary artery disease Father   . Hypertension Father   . Diabetes Father   . Colon cancer Maternal Aunt   .  Esophageal cancer Maternal Uncle   . Inflammatory bowel disease Neg Hx   . Liver disease Neg Hx   . Pancreatic cancer Neg Hx   . Rectal cancer Neg Hx   . Stomach cancer Neg Hx     Social History:  reports that she has never smoked. She has never used smokeless tobacco. She reports that she does not drink alcohol and does not use drugs.  Allergies:  Allergies  Allergen Reactions  . Nsaids Other (See Comments)    Cannot take due to Kidney disease/Kidney function   . Tolmetin Other (See Comments)    Cannot take due to Kidney Disease    Medications: I have reviewed the patient's current medications.  Review of Systems  Gastrointestinal: Positive for abdominal pain.  Genitourinary: Positive for vaginal pain (minimal right labial soreness, swelling). Negative for vaginal bleeding and vaginal discharge.    Blood pressure 111/73, pulse 98, temperature 98.6 F (37 C), temperature source Oral, resp. rate 18, height $RemoveBe'5\' 7"'IRoTyMmLx$  (1.702 m), weight 96.2 kg, SpO2 99 %. Physical Exam Vitals and nursing note reviewed. Exam conducted with a chaperone present.  Genitourinary:    Exam position: Lithotomy position.     Labia:        Right: Tenderness (very mild) and lesion (no discrete lesion, swelling r>  L, no absess, no skin lesion) present.        Left: No tenderness or lesion.        Comments: Mild right swelling    Results for orders placed or performed during the hospital encounter of 02/05/20 (from the past 48 hour(s))  Basic metabolic panel     Status: Abnormal   Collection Time: 02/11/20  1:41 AM  Result Value Ref Range   Sodium 134 (L) 135 - 145 mmol/L   Potassium 3.6 3.5 - 5.1 mmol/L   Chloride 94 (L) 98 - 111 mmol/L   CO2 22 22 - 32 mmol/L   Glucose, Bld 86 70 - 99 mg/dL    Comment: Glucose reference range applies only to samples taken after fasting for at least 8 hours.   BUN 21 (H) 6 - 20 mg/dL   Creatinine, Ser 9.83 (H) 0.44 - 1.00 mg/dL   Calcium 10.1 8.9 - 10.3 mg/dL    GFR, Estimated 5 (L) >60 mL/min    Comment: (NOTE) Calculated using the CKD-EPI Creatinine Equation (2021)    Anion gap 18 (H) 5 - 15    Comment: Performed at [facility] [address], [name], [place] [address]  CBC     Status: Abnormal   Collection Time: 02/11/20  1:41 AM  Result Value Ref Range   WBC 12.1 (H) 4.0 - 10.5 K/uL   RBC 2.83 (L) 3.87 - 5.11 MIL/uL   Hemoglobin 7.9 (L) 12.0 - 15.0 g/dL   HCT 25.5 (L) 36.0 - 46.0 %   MCV 90.1 80.0 - 100.0 fL   MCH 27.9 26.0 - 34.0 pg   MCHC 31.0 30.0 - 36.0 g/dL   RDW 14.6 11.5 - 15.5 %   Platelets 387 150 - 400 K/uL   nRBC 0.5 (H) 0.0 - 0.2 %    Comment: Performed at [facility] [address] [place], [place] [address]  CBC     Status: Abnormal   Collection Time: 02/12/20  1:54 AM  Result Value Ref Range   WBC 12.0 (H) 4.0 - 10.5 K/uL   RBC 2.99 (L) 3.87 - 5.11 MIL/uL   Hemoglobin 8.5 (L) 12.0 - 15.0 g/dL   HCT 26.5 (L) 36.0 - 46.0 %   MCV 88.6 80.0 - 100.0 fL   MCH 28.4 26.0 - 34.0 pg   MCHC 32.1 30.0 - 36.0 g/dL   RDW 14.9 11.5 - 15.5 %   Platelets 396 150 - 400 K/uL   nRBC 1.3 (H) 0.0 - 0.2 %    Comment: Performed at [facility] [address], [place], [place] [address]  Renal function panel     Status: Abnormal   Collection Time: 02/12/20 11:40 AM  Result Value Ref Range   Sodium 137 135 - 145 mmol/L   Potassium 3.8 3.5 - 5.1 mmol/L   Chloride 97 (L) 98 - 111 mmol/L   CO2 22 22 - 32 mmol/L   Glucose, Bld 92 70 - 99 mg/dL    Comment: Glucose reference range applies only to samples taken after fasting for at least 8 hours.   BUN 18 6 - 20 mg/dL   Creatinine, Ser 9.16 (H) 0.44 - 1.00 mg/dL   Calcium 10.2 8.9 - 10.3 mg/dL   Phosphorus 8.1 (H) 2.5 - 4.6 mg/dL   Albumin 2.8 (L) 3.5 - 5.0 g/dL   GFR, Estimated 5 (L) >60 mL/min    Comment: (NOTE) Calculated using the CKD-EPI  Creatinine Equation (2021)    Anion gap 18 (H) 5 - 15    Comment: Performed at [facility], [place] [address],  [place], [place] [address]    No results found.  Assessment/Plan: Recent ruptured right labial abscess by history with some recurrent swelling but no sign of abscess. Antibiotic coverage should be good with Zosyn. We discussed f/u and I sent a message to [place] to contact her for an appointment. Thank you for the consult, you may call [phone] with questions 40 min face to face and review of results and notes, charting  Emeterio Reeve 02/12/2020

## 2020-02-12 NOTE — Progress Notes (Addendum)
Subjective: Awoken from sleep seen examined in room no complaints reported to me  Objective Vital signs in last 24 hours: Vitals:   02/11/20 0718 02/11/20 1400 02/11/20 2054 02/12/20 0638  BP: 123/78 116/83 (!) 150/94 (!) 101/57  Pulse: (!) 108 98 100 92  Resp: $Remo'18 18 18 18  'DUldB$ Temp: 98.2 F (36.8 C) 98.8 F (37.1 C) 98.4 F (36.9 C) 97.9 F (36.6 C)  TempSrc: Oral Oral Oral Oral  SpO2: 98% 99% 100% 99%  Weight:      Height:       Weight change:   Physical Exam General: Obese, chronically ill appearing female.   NAD Heart:RRR, no mrg Lungs:CTAB, unlabored breathing Abdomen:soft, + normoactive bowel sounds, tenderness minimal suprapubic left lower quadrant, ND, PD catheter dressing dry clean no discharge noted Extremities:no LE edema Dialysis Access: R IJ TDC, PD cath    [facility]Re, 4 hours, BFR 400/DFR 800, EDW 97.5kg, 2K/2Ca, UF profile 2, TDC mircera 266mcg q 2 weeks- last dose 02/05/20  Problem/Plan: 1. Pericolonic abscess: CCS and GI following, on zosyn, surgical intervention planned.GIconsulted. CT 2/05 showed decrease in size of abscess. Plan for colonoscopy vs flex sig in 6 weeks as OP.  2. Acute lower GI bleed: Secondary to #1 above. Received 1 unit PRBC. Hgb  8.5 today. Recently received max dose of ESA. Transfuse PRN.  TSAT 28 no IV iron secondary infection 3. ESRD prior PD now on HD (MWF )=usually PD , recently transitioned to HD due to feeling poorly/insufficient clearances with PD, agreeable to continue HD for now.On MWF schedule.  Has had HD Friday and Saturday post MRI with contrast.  Next HD today.Will need to flush PD cath weekly to maintain patency. 4. Hypokalemia:Resolved.K 3.6  on 02/06using high K+ baths with HD. 5. Hypertension/volume:BP improved post HD, 101/57 this a.m.Marland Kitchenno edema on exam.UF with HD as tolerated.  On home BP meds.  Hydralazine 50 mg twice daily, can DC now, follow-up BP  trend please avoid additional IVF as much as possible. 6. Metabolic bone disease:^ Corrected calcium. Not on VDRA. Resume binders once tolerating PO 7. Rapid eye fluttering/deviation - neruo consulted. MRI with &w/o contrast 02/05 prior to HD with no acute abnormalities. EEG normal.  Plan for additional work up as outpatient per neuro   Ernest Haber, PA-C [place] [phone] 02/12/2020,9:53 AM  LOS: 6 days   Pt seen, examined and agree w A/P as above.  Kelly Splinter  MD 02/12/2020, 5:17 PM    Labs: Basic Metabolic Panel: Recent Labs  Lab 02/09/20 0342 02/10/20 0228 02/11/20 0141  NA 138 134* 134*  K 4.0 3.7 3.6  CL 98 94* 94*  CO2 $Re'22 23 22  'JZc$ GLUCOSE 77 89 86  BUN 21* 13 21*  CREATININE 9.96* 7.76* 9.83*  CALCIUM 9.7 9.8 10.1   Liver Function Tests: Recent Labs  Lab 02/05/20 1850  AST 18  ALT 13  ALKPHOS 59  BILITOT 0.7  PROT 7.2  ALBUMIN 2.8*   No results for input(s): LIPASE, AMYLASE in the last 168 hours. No results for input(s): AMMONIA in the last 168 hours. CBC: Recent Labs  Lab 02/06/20 0835 02/06/20 1627 02/08/20 0454 02/09/20 0342 02/10/20 0228 02/11/20 0141 02/12/20 0154  WBC 13.8*   < > 13.2* 10.8* 11.8* 12.1* 12.0*  NEUTROABS 10.8*  --   --   --   --   --   --   HGB 7.3*   < > 7.5*  7.5* 8.2* 7.9* 8.5*  HCT 23.3*   < > 23.2* 23.8* 26.4* 25.5* 26.5*  MCV 87.9   < > 87.9 88.5 88.3 90.1 88.6  PLT 259   < > 298 341 357 387 396   < > = values in this interval not displayed.   Cardiac Enzymes: No results for input(s): CKTOTAL, CKMB, CKMBINDEX, TROPONINI in the last 168 hours. CBG: No results for input(s): GLUCAP in the last 168 hours.  Studies/Results: No results found. Medications: . sodium chloride 10 mL/hr at 02/07/20 1511  . piperacillin-tazobactam (ZOSYN)  IV 2.25 g (02/12/20 0402)   . Chlorhexidine Gluconate Cloth  6 each Topical Q0600  . gabapentin  300 mg Oral BID  . heparin injection (subcutaneous)   5,000 Units Subcutaneous Q8H  . hydrALAZINE  50 mg Oral BID  . influenza vac split quadrivalent PF  0.5 mL Intramuscular Tomorrow-1000  . pantoprazole  40 mg Oral Q0600

## 2020-02-13 DIAGNOSIS — K630 Abscess of intestine: Secondary | ICD-10-CM | POA: Diagnosis not present

## 2020-02-13 LAB — CBC WITH DIFFERENTIAL/PLATELET
Abs Immature Granulocytes: 0.49 10*3/uL — ABNORMAL HIGH (ref 0.00–0.07)
Basophils Absolute: 0.1 10*3/uL (ref 0.0–0.1)
Basophils Relative: 1 %
Eosinophils Absolute: 0.2 10*3/uL (ref 0.0–0.5)
Eosinophils Relative: 2 %
HCT: 27.8 % — ABNORMAL LOW (ref 36.0–46.0)
Hemoglobin: 8.8 g/dL — ABNORMAL LOW (ref 12.0–15.0)
Immature Granulocytes: 4 %
Lymphocytes Relative: 26 %
Lymphs Abs: 3.2 10*3/uL (ref 0.7–4.0)
MCH: 28.3 pg (ref 26.0–34.0)
MCHC: 31.7 g/dL (ref 30.0–36.0)
MCV: 89.4 fL (ref 80.0–100.0)
Monocytes Absolute: 1.1 10*3/uL — ABNORMAL HIGH (ref 0.1–1.0)
Monocytes Relative: 9 %
Neutro Abs: 7.3 10*3/uL (ref 1.7–7.7)
Neutrophils Relative %: 58 %
Platelets: 405 10*3/uL — ABNORMAL HIGH (ref 150–400)
RBC: 3.11 MIL/uL — ABNORMAL LOW (ref 3.87–5.11)
RDW: 15.4 % (ref 11.5–15.5)
WBC: 12.3 10*3/uL — ABNORMAL HIGH (ref 4.0–10.5)
nRBC: 1.8 % — ABNORMAL HIGH (ref 0.0–0.2)

## 2020-02-13 LAB — COMPREHENSIVE METABOLIC PANEL
ALT: 13 U/L (ref 0–44)
AST: 16 U/L (ref 15–41)
Albumin: 3.2 g/dL — ABNORMAL LOW (ref 3.5–5.0)
Alkaline Phosphatase: 43 U/L (ref 38–126)
Anion gap: 17 — ABNORMAL HIGH (ref 5–15)
BUN: 12 mg/dL (ref 6–20)
CO2: 23 mmol/L (ref 22–32)
Calcium: 10.4 mg/dL — ABNORMAL HIGH (ref 8.9–10.3)
Chloride: 95 mmol/L — ABNORMAL LOW (ref 98–111)
Creatinine, Ser: 6.84 mg/dL — ABNORMAL HIGH (ref 0.44–1.00)
GFR, Estimated: 7 mL/min — ABNORMAL LOW (ref >60)
Glucose, Bld: 86 mg/dL (ref 70–99)
Potassium: 3.9 mmol/L (ref 3.5–5.1)
Sodium: 135 mmol/L (ref 135–145)
Total Bilirubin: 0.8 mg/dL (ref 0.3–1.2)
Total Protein: 8.1 g/dL (ref 6.5–8.1)

## 2020-02-13 LAB — C-REACTIVE PROTEIN: CRP: 5.8 mg/dL — ABNORMAL HIGH (ref <1.0)

## 2020-02-13 LAB — MAGNESIUM: Magnesium: 2 mg/dL (ref 1.7–2.4)

## 2020-02-13 LAB — GLUCOSE, CAPILLARY: Glucose-Capillary: 90 mg/dL (ref 70–99)

## 2020-02-13 LAB — SEDIMENTATION RATE: Sed Rate: >140 mm/hr — ABNORMAL HIGH (ref 0–22)

## 2020-02-13 MED ORDER — DARBEPOETIN ALFA 200 MCG/0.4ML IJ SOSY
200.0000 ug | PREFILLED_SYRINGE | INTRAMUSCULAR | Status: DC
  Filled 2020-02-13: qty 0.4

## 2020-02-13 MED ORDER — FERRIC CITRATE 1 GM 210 MG(FE) PO TABS
420.0000 mg | ORAL_TABLET | Freq: Three times a day (TID) | ORAL | Status: DC
  Filled 2020-02-13 (×4): qty 2

## 2020-02-13 NOTE — Progress Notes (Signed)
[facility] Progress Note  Patient: Patricia [name]    HEN:[id number]  DOA: 02/05/2020     Date of Service: the patient was seen and examined on 02/13/2020  Brief hospital course: Past medical history of ESRD on HD MWF, HTN, anxiety, recurrent GI bleed.  Presents with complaints of abdominal pain and GI bleed found to have pericolonic abscess as well as labial abscess currently on IV antibiotics. For GI bleed, GI was consulted.  Conservative measures recommended.  Currently signed off General surgery following for pericolonic abscess.  Currently on IV antibiotics. Nephrology consulted for HD. Currently plan is continue IV antibiotics.  CT abdomen tomorrow.   Assessment and Plan: 1.  Suspected diverticulitis with pericolonic abscess between the rectum and sigmoid. Labial abscess CT on 2/1 shows fluid collection posterior to sigmoid. With leukocytosis on admission. IR was consulted, abscess not amenable to drain. Patient was started on IV Zosyn. Diet was advanced but patient continues to have some abdominal pain.  So now back to NPO status  Repeat CT abdomen tomorrow  WBC count has a mild uptrend.  Initiating heparin for DVT prophylaxis per general surgery. Continue IV antibiotics until improvement Colonoscopy in 6 weeks. GYN consulted for labial abscess. Recommended conservative management.  ESR >140 and CRP elevated.   2.  GI bleed Appreciate GI assistance, currently signed off. Outpatient follow-up recommended. Treated conservatively.  GI bleed currently resolved. No plan for endoscopic evaluation at this time. Colonoscopy evaluation via colonoscopy or flexible sigmoidoscopy in 6 weeks for her pericolonic abscess. Patient has seen [facility], [facility] in the past.   Seen by [facility] during this admission.  3.  ESRD on MWF HD Initially was on PD.  Currently transition to HD due to poor clearance. Management of HD per nephrology. Flush PD catheter weekly to  maintain patency. Electrolyte and volume management per nephrology via HD.  4.  Rapid eye fluttering Neurology was consulted. 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 MRI did not show any acute abnormality. B12 normal. EEG showed no evidence of active seizures. Outpatient work-up recommended with neurology.  No further work-up or treatment in the hospital.  5.  Chronic neuropathy On gabapentin. Continue.  6.  HTN Blood pressure stable. Continue current regimen.  7.  HLD Continue statin.  8.  Obesity. Placing the patient at high risk of poor outcome.  Monitor. Body mass index is 34.11 kg/m.   Diet: Clear liquid diet DVT Prophylaxis:   heparin injection 5,000 Units Start: 02/11/20 1430 SCDs Start: 02/06/20 3614   Advance goals of care discussion: Full code  Family Communication: no family was present at bedside, at the time of interview.   Disposition:  Status is: Inpatient  Remains inpatient appropriate because:Ongoing active pain requiring inpatient pain management monitor for improvement in clinical symptoms of abdominal pain and labial abscess.  Dispo: The patient is from: Home              Anticipated d/c is to: Home              Anticipated d/c date is: 3 days              Patient currently is not medically stable to d/c.   Difficult to place patient   Subjective: Continues to have abdominal pain.  No nausea or vomiting.  No fever no chills.  Diarrhea improving.  Reports some headache.  Physical Exam:  General: Appear in mild distress, no Rash; Oral Mucosa Clear, moist.  no Abnormal Neck Mass Or lumps, Conjunctiva normal  Cardiovascular: S1 and S2 Present, no Murmur, Respiratory: good respiratory effort, Bilateral Air entry present and CTA, no Crackles, no wheezes Abdomen: Bowel Sound present, Soft and mild tenderness Extremities: no Pedal edema Neurology: alert and oriented to time, place, and person affect appropriate. no new focal deficit Gait  not checked due to patient safety concerns  Vitals:   02/12/20 1447 02/12/20 2001 02/13/20 0402 02/13/20 1300  BP: 111/73 134/79 123/80 131/62  Pulse: 98 96 96 92  Resp: '18 18 18 16  ' Temp: 98.6 F (37 C) 98.3 F (36.8 C) 98.7 F (37.1 C) 98.7 F (37.1 C)  TempSrc: Oral Oral Oral Oral  SpO2:  100% 99% 100%  Weight:   98.8 kg   Height:       No intake or output data in the 24 hours ending 02/13/20 1728 Filed Weights   02/12/20 1125 02/12/20 1406 02/13/20 0402  Weight: 97 kg 96.2 kg 98.8 kg    Data Reviewed: I have personally reviewed and interpreted daily labs, tele strips, imaging. I reviewed all nursing notes, pharmacy notes, vitals, pertinent old records I have discussed plan of care as described above with RN and patient/family.  CBC: Recent Labs  Lab 02/09/20 0342 02/10/20 0228 02/11/20 0141 02/12/20 0154 02/13/20 0252  WBC 10.8* 11.8* 12.1* 12.0* 12.3*  NEUTROABS  --   --   --   --  7.3  HGB 7.5* 8.2* 7.9* 8.5* 8.8*  HCT 23.8* 26.4* 25.5* 26.5* 27.8*  MCV 88.5 88.3 90.1 88.6 89.4  PLT 341 357 387 396 794*   Basic Metabolic Panel: Recent Labs  Lab 02/08/20 2105 02/09/20 0342 02/10/20 0228 02/11/20 0141 02/12/20 1140 02/13/20 0252  NA  --  138 134* 134* 137 135  K  --  4.0 3.7 3.6 3.8 3.9  CL  --  98 94* 94* 97* 95*  CO2  --  '22 23 22 22 23  ' GLUCOSE  --  77 89 86 92 86  BUN  --  21* 13 21* 18 12  CREATININE  --  9.96* 7.76* 9.83* 9.16* 6.84*  CALCIUM  --  9.7 9.8 10.1 10.2 10.4*  MG 2.0  --   --   --   --  2.0  PHOS  --   --   --   --  8.1*  --     Studies: No results found.  Scheduled Meds: . Chlorhexidine Gluconate Cloth  6 each Topical Q0600  . darbepoetin (ARANESP) injection - DIALYSIS  200 mcg Intravenous Q Tue-HD  . ferric citrate  420 mg Oral TID WC  . gabapentin  300 mg Oral BID  . heparin injection (subcutaneous)  5,000 Units Subcutaneous Q8H  . influenza vac split quadrivalent PF  0.5 mL Intramuscular Tomorrow-1000  . pantoprazole   40 mg Oral Q0600   Continuous Infusions: . sodium chloride 500 mL (02/12/20 2010)  . piperacillin-tazobactam (ZOSYN)  IV 2.25 g (02/13/20 1051)   PRN Meds: sodium chloride, acetaminophen **OR** acetaminophen, ALPRAZolam, hydrOXYzine, labetalol, LORazepam, ondansetron **OR** ondansetron (ZOFRAN) IV, traMADol  Time spent: 35 minutes  Author: Berle Mull, MD [facility] 02/13/2020 5:28 PM  To reach On-call, see care teams to locate the attending and reach out via [email or web address]. Between 7PM-7AM, please contact night-coverage If you still have difficulty reaching the attending provider, please page the [facility] (Director on Call) for [facility] on amion for assistance.

## 2020-02-13 NOTE — Progress Notes (Signed)
[facility] Progress Note     Subjective: CC-  Feels about the same as yesterday. Continues to have some LLQ/suprapubic abdominal pain with nausea, no emesis. Tolerating few sips of liquids. 1 loose stool yesterday. WBC 12.3 from 12, afebrile. Also continues to have some right labial pain. No drainage from this area. Using warm compresses. Seen by GYN who recommended continuing antibiotics, no sign of abscess.  Objective: Vital signs in last 24 hours: Temp:  [97.9 F (36.6 C)-98.8 F (37.1 C)] 98.7 F (37.1 C) (02/08 0402) Pulse Rate:  [89-105] 96 (02/08 0402) Resp:  [16-18] 18 (02/08 0402) BP: (76-153)/(48-97) 123/80 (02/08 0402) SpO2:  [98 %-100 %] 99 % (02/08 0402) Weight:  [96.2 kg-98.8 kg] 98.8 kg (02/08 0402) Last BM Date: 02/12/20  Intake/Output from previous day: 02/07 0701 - 02/08 0700 In: -  Out: 671  Intake/Output this shift: No intake/output data recorded.  PE: Gen: Awake and alert, NAD Resp: normal rate and effort  WL:NLGXQ, soft, tendernessin the suprapubic region and left lower quadrant. Noguarding or rigidity.Positive bowel sounds. PD catheter with dressing overlying GU: swelling on the right labia with a small area area of fluctuance on the inferior fold and mild surrounding induration superior. No drainage.   Lab Results:  Recent Labs    02/12/20 0154 02/13/20 0252  WBC 12.0* 12.3*  HGB 8.5* 8.8*  HCT 26.5* 27.8*  PLT 396 405*   BMET Recent Labs    02/12/20 1140 02/13/20 0252  NA 137 135  K 3.8 3.9  CL 97* 95*  CO2 22 23  GLUCOSE 92 86  BUN 18 12  CREATININE 9.16* 6.84*  CALCIUM 10.2 10.4*   PT/INR No results for input(s): LABPROT, INR in the last 72 hours. CMP     Component Value Date/Time   NA 135 02/13/2020 0252   K 3.9 02/13/2020 0252   CL 95 (L) 02/13/2020 0252   CO2 23 02/13/2020 0252   GLUCOSE 86 02/13/2020 0252   BUN 12 02/13/2020 0252   CREATININE 6.84 (H) 02/13/2020 0252   CALCIUM 10.4 (H) 02/13/2020  0252   PROT 8.1 02/13/2020 0252   ALBUMIN 3.2 (L) 02/13/2020 0252   AST 16 02/13/2020 0252   ALT 13 02/13/2020 0252   ALKPHOS 43 02/13/2020 0252   BILITOT 0.8 02/13/2020 0252   GFRNONAA 7 (L) 02/13/2020 0252   GFRAA 4 (L) 05/14/2019 2254   Lipase     Component Value Date/Time   LIPASE 34 05/11/2019 0515       Studies/Results: No results found.  Anti-infectives: Anti-infectives (From admission, onward)   Start     Dose/Rate Route Frequency Ordered Stop   02/06/20 2000  piperacillin-tazobactam (ZOSYN) IVPB 3.375 g  Status:  Discontinued       Note to Pharmacy: Zosyn 3.375 g IV q12h in ESRD on HD   3.375 g 12.5 mL/hr over 240 Minutes Intravenous Every 12 hours 02/06/20 0352 02/06/20 0806   02/06/20 1200  piperacillin-tazobactam (ZOSYN) IVPB 2.25 g       Note to Pharmacy: Zosyn 3.375 g IV q12h in ESRD on HD   2.25 g 100 mL/hr over 30 Minutes Intravenous Every 8 hours 02/06/20 0806     02/06/20 0245  piperacillin-tazobactam (ZOSYN) IVPB 3.375 g        3.375 g 100 mL/hr over 30 Minutes Intravenous  Once 02/06/20 0238 02/06/20 0415       Assessment/Plan End-stage renal disease (PD >> HD MWF) - stitch from prior surgery  removed today GI bleed -? Diverticular related. GI has seen Hypertension Acute on chronic anemia Right labial abscess- seen by GYN 2/7, no indication for I&D, continue abx and warm compresses - Defer to TRH -  Suspected diverticulitis with pericolonic abscess between the rectum and sigmoid - CT 2/1 w/Changes of diverticulosis with a fluid collection posterior to the sigmoid and anterior to the rectum as described suspicious for small pericolonic abscess likely arising from the posterior wall of the sigmoid colon. - Repeat CT 2/4 w/ persistent wall thickening of the sigmoid colon and rectum with surrounding infiltrative changes consistent with diverticulitis. Decreased size of a small pericolic diverticular abscess collection, nowmeasuring16 x 15  mm.Appears to small to drain - WBC 13.8>> 14.8>> 13.3>> 13.2>>10.8>>11.8>>12.1>>12.0>>12.3 - Hopefully will improve with conservative therapies and be able to avoid surgery during admission.  VNR:WCHJ/SCBIP ID: Zosyn 2/1 >> day#8 JRP:ZPSU, heparin subq Follow-up: TBD  Plan: Continue IV zosyn and sips/chips today. With persistent abdominal pain/nausea and leukocytosis, plan to repeat CT abdomen/pelvis tomorrow (ie 5 days after last CT scan). Will plan to do this prior to dialysis so we can give her PO and IV contrast.   LOS: 7 days    Wellington Hampshire, [facility] 02/13/2020, 8:25 AM Please see Amion for pager number during day hours 7:00am-4:30pm

## 2020-02-13 NOTE — Progress Notes (Addendum)
Subjective: Seen in room, said tolerated dialysis yesterday this a.m. complaints of some lower abdominal discomfort but not acute  Objective Vital signs in last 24 hours: Vitals:   02/12/20 1406 02/12/20 1447 02/12/20 2001 02/13/20 0402  BP: 120/68 111/73 134/79 123/80  Pulse: 99 98 96 96  Resp: $Remo'18 18 18 18  'PZShU$ Temp: 97.9 F (36.6 C) 98.6 F (37 C) 98.3 F (36.8 C) 98.7 F (37.1 C)  TempSrc: Oral Oral Oral Oral  SpO2: 99%  100% 99%  Weight: 96.2 kg   98.8 kg  Height:       Weight change:   Physical Exam General: Obese, chronically ill appearing female.  NAD Heart:RRR, no mrg Lungs:CTAB, unlabored breathing Abdomen:soft, + normoactive bowel sounds, tenderness minimal suprapubic left lower quadrant, ND, PD catheter dressing dry clean no discharge noted Extremities:no LE edema Dialysis Access:R IJ TDC, PD cath   [facility], 4 hours, BFR 400/DFR 800, EDW 97.5kg, 2K/2Ca, UF profile 2, TDC mircera 227mcg q 2 weeks- last dose 02/05/20  Problem/Plan: 1. Pericolonic abscess: CCS and GI following, on zosyn, surgical intervention planned.GIconsulted. CT 2/05 showed decrease in size of abscess. Plan for colonoscopy vs flex sig in 6 weeks as OP.  2. Acute lower GI bleed: Secondary to #1 above. Received 1 unit PRBC. Hgb 8.8. Recently received max dose of ESA. Transfuse PRN.  TSAT 28 no IV iron secondary infection 3. ESRD prior PD now on HD (MWF )=usually PD , recently transitioned to HD due to feeling poorly/insufficient clearances with PD, agreeable to continue HD for now.On MWF schedule.  Has had HDFriday and Saturday yesterday Monday, post MRI with contrast. Will need to flush PD cath weekly to maintain patency. 4. Hypokalemia:Resolved.K 3.9 on using high K+ baths with HD. 5. Hypertension/volume:BPimproved post HD, 101/57 this a.m.Marland Kitchenno edema on exam.UF with HD as tolerated.  On home BP meds.  Hydralazine 50 mg  twice daily, can DC now, follow-up BP trend please avoid additional IVF as much as possible. 6. Metabolic bone disease:^ Corrected calcium.  Phosphorus 8.1 not on VDRA. Resume binders =Auryxia 7. Anemia of ESRD= Hgb 8.8 continue Aranesp 200, using max Mircera as an outpatient 8. Rapid eye fluttering/deviation - neruo consulted. MRI with &w/o contrast 02/05 prior to HD with no acute abnormalities. EEGnormal. Plan for additional work up as outpatient per neuro   Ernest Haber, PA-C [facility] [phone] 02/13/2020,11:04 AM  LOS: 7 days   Labs: Basic Metabolic Panel: Recent Labs  Lab 02/11/20 0141 02/12/20 1140 02/13/20 0252  NA 134* 137 135  K 3.6 3.8 3.9  CL 94* 97* 95*  CO2 $Re'22 22 23  'Ecr$ GLUCOSE 86 92 86  BUN 21* 18 12  CREATININE 9.83* 9.16* 6.84*  CALCIUM 10.1 10.2 10.4*  PHOS  --  8.1*  --    Liver Function Tests: Recent Labs  Lab 02/12/20 1140 02/13/20 0252  AST  --  16  ALT  --  13  ALKPHOS  --  43  BILITOT  --  0.8  PROT  --  8.1  ALBUMIN 2.8* 3.2*   No results for input(s): LIPASE, AMYLASE in the last 168 hours. No results for input(s): AMMONIA in the last 168 hours. CBC: Recent Labs  Lab 02/09/20 0342 02/10/20 0228 02/11/20 0141 02/12/20 0154 02/13/20 0252  WBC 10.8* 11.8* 12.1* 12.0* 12.3*  NEUTROABS  --   --   --   --  7.3  HGB 7.5* 8.2* 7.9* 8.5*  8.8*  HCT 23.8* 26.4* 25.5* 26.5* 27.8*  MCV 88.5 88.3 90.1 88.6 89.4  PLT 341 357 387 396 405*   Cardiac Enzymes: No results for input(s): CKTOTAL, CKMB, CKMBINDEX, TROPONINI in the last 168 hours. CBG: Recent Labs  Lab 02/13/20 0829  GLUCAP 90    Studies/Results: No results found. Medications: . sodium chloride 500 mL (02/12/20 2010)  . piperacillin-tazobactam (ZOSYN)  IV 2.25 g (02/13/20 1051)   . Chlorhexidine Gluconate Cloth  6 each Topical Q0600  . gabapentin  300 mg Oral BID  . heparin injection (subcutaneous)  5,000 Units Subcutaneous Q8H  . influenza vac  split quadrivalent PF  0.5 mL Intramuscular Tomorrow-1000  . pantoprazole  40 mg Oral Q0600

## 2020-02-13 NOTE — Care Management Important Message (Signed)
Important Message  Patient Details  Name: Patricia [name] MRN: [id number] Date of Birth: 1975-03-02   Medicare Important Message Given:  Yes     Shelda Altes 02/13/2020, 11:21 AM

## 2020-02-14 ENCOUNTER — Inpatient Hospital Stay (HOSPITAL_COMMUNITY): Payer: Medicare Other

## 2020-02-14 DIAGNOSIS — K630 Abscess of intestine: Secondary | ICD-10-CM | POA: Diagnosis not present

## 2020-02-14 LAB — MAGNESIUM: Magnesium: 2 mg/dL (ref 1.7–2.4)

## 2020-02-14 LAB — COMPREHENSIVE METABOLIC PANEL
ALT: 12 U/L (ref 0–44)
AST: 16 U/L (ref 15–41)
Albumin: 3.1 g/dL — ABNORMAL LOW (ref 3.5–5.0)
Alkaline Phosphatase: 37 U/L — ABNORMAL LOW (ref 38–126)
Anion gap: 19 — ABNORMAL HIGH (ref 5–15)
BUN: 21 mg/dL — ABNORMAL HIGH (ref 6–20)
CO2: 19 mmol/L — ABNORMAL LOW (ref 22–32)
Calcium: 10.4 mg/dL — ABNORMAL HIGH (ref 8.9–10.3)
Chloride: 95 mmol/L — ABNORMAL LOW (ref 98–111)
Creatinine, Ser: 9.51 mg/dL — ABNORMAL HIGH (ref 0.44–1.00)
GFR, Estimated: 5 mL/min — ABNORMAL LOW (ref >60)
Glucose, Bld: 83 mg/dL (ref 70–99)
Potassium: 3.8 mmol/L (ref 3.5–5.1)
Sodium: 133 mmol/L — ABNORMAL LOW (ref 135–145)
Total Bilirubin: 0.8 mg/dL (ref 0.3–1.2)
Total Protein: 7.6 g/dL (ref 6.5–8.1)

## 2020-02-14 LAB — CBC WITH DIFFERENTIAL/PLATELET
Abs Immature Granulocytes: 0.25 10*3/uL — ABNORMAL HIGH (ref 0.00–0.07)
Basophils Absolute: 0.1 10*3/uL (ref 0.0–0.1)
Basophils Relative: 1 %
Eosinophils Absolute: 0.2 10*3/uL (ref 0.0–0.5)
Eosinophils Relative: 2 %
HCT: 25.7 % — ABNORMAL LOW (ref 36.0–46.0)
Hemoglobin: 8.2 g/dL — ABNORMAL LOW (ref 12.0–15.0)
Immature Granulocytes: 2 %
Lymphocytes Relative: 28 %
Lymphs Abs: 3.3 10*3/uL (ref 0.7–4.0)
MCH: 28.5 pg (ref 26.0–34.0)
MCHC: 31.9 g/dL (ref 30.0–36.0)
MCV: 89.2 fL (ref 80.0–100.0)
Monocytes Absolute: 1.1 10*3/uL — ABNORMAL HIGH (ref 0.1–1.0)
Monocytes Relative: 9 %
Neutro Abs: 6.9 10*3/uL (ref 1.7–7.7)
Neutrophils Relative %: 58 %
Platelets: 357 10*3/uL (ref 150–400)
RBC: 2.88 MIL/uL — ABNORMAL LOW (ref 3.87–5.11)
RDW: 15.9 % — ABNORMAL HIGH (ref 11.5–15.5)
WBC: 11.8 10*3/uL — ABNORMAL HIGH (ref 4.0–10.5)
nRBC: 1.6 % — ABNORMAL HIGH (ref 0.0–0.2)

## 2020-02-14 LAB — C-REACTIVE PROTEIN: CRP: 4.5 mg/dL — ABNORMAL HIGH (ref <1.0)

## 2020-02-14 MED ORDER — BOOST / RESOURCE BREEZE PO LIQD CUSTOM
1.0000 | Freq: Three times a day (TID) | ORAL | Status: DC
  Administered 2020-02-14 – 2020-02-16 (×3): 1 via ORAL

## 2020-02-14 MED ORDER — DARBEPOETIN ALFA 200 MCG/0.4ML IJ SOSY
200.0000 ug | PREFILLED_SYRINGE | INTRAMUSCULAR | Status: DC
  Administered 2020-02-14: 200 ug via INTRAVENOUS

## 2020-02-14 MED ORDER — SACCHAROMYCES BOULARDII 250 MG PO CAPS
250.0000 mg | ORAL_CAPSULE | Freq: Two times a day (BID) | ORAL | Status: DC
Start: 2020-02-14 — End: 2020-02-16
  Administered 2020-02-14 – 2020-02-16 (×4): 250 mg via ORAL
  Filled 2020-02-14 (×6): qty 1

## 2020-02-14 MED ORDER — IOHEXOL 300 MG/ML  SOLN
100.0000 mL | Freq: Once | INTRAMUSCULAR | Status: AC | PRN
  Administered 2020-02-14: 100 mL via INTRAVENOUS

## 2020-02-14 MED ORDER — SEVELAMER CARBONATE 2.4 G PO PACK
2.4000 g | PACK | Freq: Three times a day (TID) | ORAL | Status: DC
  Administered 2020-02-14 – 2020-02-15 (×2): 2.4 g via ORAL
  Filled 2020-02-14 (×9): qty 1

## 2020-02-14 MED ORDER — ZOLPIDEM TARTRATE 5 MG PO TABS
5.0000 mg | ORAL_TABLET | Freq: Every evening | ORAL | Status: DC | PRN
  Administered 2020-02-14 – 2020-02-15 (×2): 5 mg via ORAL
  Filled 2020-02-14 (×2): qty 1

## 2020-02-14 MED ORDER — RENA-VITE PO TABS
1.0000 | ORAL_TABLET | Freq: Every day | ORAL | Status: DC
  Administered 2020-02-14 – 2020-02-15 (×2): 1 via ORAL
  Filled 2020-02-14 (×2): qty 1

## 2020-02-14 MED ORDER — DARBEPOETIN ALFA 200 MCG/0.4ML IJ SOSY: 200.0000 ug | PREFILLED_SYRINGE | INTRAMUSCULAR | Status: DC

## 2020-02-14 MED ORDER — DARBEPOETIN ALFA 200 MCG/0.4ML IJ SOSY
PREFILLED_SYRINGE | INTRAMUSCULAR | Status: AC
  Filled 2020-02-14: qty 0.4

## 2020-02-14 MED ORDER — HEPARIN SODIUM (PORCINE) 1000 UNIT/ML IJ SOLN
INTRAMUSCULAR | Status: AC
  Filled 2020-02-14: qty 4

## 2020-02-14 NOTE — Progress Notes (Signed)
Initial Nutrition Assessment  DOCUMENTATION CODES:   Not applicable  INTERVENTION:   Boost Breeze po TID, each supplement provides 250 kcal and 9 grams of protein  NUTRITION DIAGNOSIS:   Inadequate oral intake related to nausea,poor appetite,altered GI function as evidenced by meal completion < 50%.  GOAL:   Patient will meet greater than or equal to 90% of their needs  MONITOR:   PO intake,Supplement acceptance,Diet advancement,Labs  REASON FOR ASSESSMENT:   Malnutrition Screening Tool    ASSESSMENT:   45 yo female admitted with GI bleed, pericolonic abscess, labial abscess. PMH includes ESRD on HD, HTN, anxiety, recurrent GI bleed, iron deficiency anemia, stroke 2019.   Conservative management recommended for GI bleed and labial abscess. Patient is receiving IV antibiotics.   Patient has been either NPO or on a clear liquid diet for all but two days this admission. She was on full liquids from 2/5 to 2/7. Intake has been minimal due to abdominal pain, diarrhea, and nausea.  Advanced to clear liquids this morning.  Labs reviewed. Na 133 CBG: 90 this morning  Medications reviewed and include Aranesp, Rena-vit, Protonix, Florastor, Renvela.  Usual weights reviewed.  115 kg (10/03/19) down to 96.3 kg today 16% weight loss within 6 months is severe. Some of the weight loss could be r/t fluid shifts with hx of ESRD on PD, now on HD.   NUTRITION - FOCUSED PHYSICAL EXAM:  unable to complete  Diet Order:   Diet Order            Diet clear liquid Room service appropriate? Yes; Fluid consistency: Thin  Diet effective now                 EDUCATION NEEDS:   Not appropriate for education at this time  Skin:  Skin Assessment: Reviewed RN Assessment  Last BM:  2/8  Height:   Ht Readings from Last 1 Encounters:  02/14/20 $RemoveB'5\' 7"'yCbNuoxB$  (1.702 m)    Weight:   Wt Readings from Last 1 Encounters:  02/14/20 96.3 kg    BMI:  Body mass index is 33.25  kg/m.  Estimated Nutritional Needs:   Kcal:  2200-2400  Protein:  95-115 gm  Fluid:  1 L + UOP    Lucas Mallow, RD, LDN, CNSC Please refer to Amion for contact information.

## 2020-02-14 NOTE — Progress Notes (Signed)
Patient seen and examined this afternoon. Our team was unable to examine right labial swelling this am given she was in dialysis. The patient reports that she thinks the swelling and pain has improved in this area.  Patient also advanced to CLD this am. She notes that she has been nauseated before and after drinking cld but her level of nausea after drinking does not increase and she has not had any emesis. No increased abdominal pain.   Exam: Abd: Obese, soft, tendernessin thesuprapubicregion and left lower quadrant. [place] orrigidity. PD catheter with dressing overlying GU: Chaperone, RN present Bonnita Nasuti). There is swelling on the right labia with a small area area of fluctuance on the inferior fold and mild surrounding induration superior. No drainage.  Plan: Right labial abscess-seen by GYN 2/7, no indication for I&D, will continue abx and warm compresses  Plan for diet: Given continued nausea, will leave on CLD. Possible adv to FLD tomorrow. AM labs.   Alferd Apa, PA-C

## 2020-02-14 NOTE — Progress Notes (Signed)
Subjective: Seen on dialysis, BP 91/ has not eaten much for past 24 hours will decrease UF goal.  However this a.m. stating tolerating some fluids abdominal pain better  Objective Vital signs in last 24 hours: Vitals:   02/13/20 0402 02/13/20 1300 02/13/20 2117 02/14/20 0431  BP: 123/80 131/62 (!) 142/84 127/63  Pulse: 96 92 98 (!) 102  Resp: $Remo'18 16  16  'gJaHv$ Temp: 98.7 F (37.1 C) 98.7 F (37.1 C) 98.6 F (37 C) 98.6 F (37 C)  TempSrc: Oral Oral Oral Oral  SpO2: 99% 100% 100% 100%  Weight: 98.8 kg   99.9 kg  Height:    '5\' 7"'$  (1.702 m)   Weight change: 2.9 kg   Physical Exam General:Obese, chronically ill appearing female.NAD Heart:RRR, no mrg Lungs:CTAB,unlabored breathing Abdomen:soft, +normoactive bowel sounds,tendernessminimal suprapubic left lower quadrant, ND, PD catheter dressing dry clean no discharge noted Extremities:no LE edema Dialysis Access:R IJ TDC, PD cath   [facility]Re, 4 hours, BFR 400/DFR 800, EDW 97.5kg, 2K/2Ca, UF profile 2, TDC mircera 264mcg q 2 weeks- last dose 02/05/20  Problem/Plan: 1. Pericolonic abscess:CCS and GI following, on zosyn,surgical intervention planned.GIconsulted. CT2/05showed decrease in size of abscess.  Noted per admit CT scan today.  Plan for colonoscopy vs flex sig in 6 weeks as OP.  2. Acute lower GI bleed: A.m. Hgb 8.2 secondary to #1 above. Received 1 unit PRBC.Marland Kitchen Recently received max dose of ESA.  As outpatient continue today.  Transfuse PRN.TSAT 28 no IV iron secondary infection 3. [place] PD now on HD (MWF)/ recently transitioned to HD due to feeling poorly/insufficient clearances with PD,agreeable to continue HD for now.On MWF schedule.Will need to flush PD cath weekly to maintain patency. 4. Hypokalemia:Resolved.K 3.8pre-HD on HD. 5. Hypertension/volume:BPthis a.m. systolic 91 / UF goal decreased as not taking much p.o. no edema on  exam..Onhome BP meds DC'dhydralazine 50 mg twice daily,  follow-up BP trend please avoid additional IVF as much as possible. 6. Metabolic bone disease:^Corrected calcium.  Phosphorus 8.1 not on VDRA.  Tells me she was on Renvela powder as binder resume  7. Anemia of ESRD= Hgb 8.2 continue Aranesp 200, q. Wednesday dialysis dose today using max Mircera as an outpatient, transferrin sat 28% no IV iron with infection 8. Rapid eye fluttering/deviation -may be slightly better this a.m. neruo consulted. MRI with &w/o contrast02/05prior to HD with no acute abnormalities. EEGnormal. Plan for additional work up as outpatientper neuro  Ernest Haber, PA-C [place] [phone] 02/14/2020,8:41 AM  LOS: 8 days   Labs: Basic Metabolic Panel: Recent Labs  Lab 02/12/20 1140 02/13/20 0252 02/14/20 0201  NA 137 135 133*  K 3.8 3.9 3.8  CL 97* 95* 95*  CO2 22 23 19*  GLUCOSE 92 86 83  BUN 18 12 21*  CREATININE 9.16* 6.84* 9.51*  CALCIUM 10.2 10.4* 10.4*  PHOS 8.1*  --   --    Liver Function Tests: Recent Labs  Lab 02/12/20 1140 02/13/20 0252 02/14/20 0201  AST  --  16 16  ALT  --  13 12  ALKPHOS  --  43 37*  BILITOT  --  0.8 0.8  PROT  --  8.1 7.6  ALBUMIN 2.8* 3.2* 3.1*   No results for input(s): LIPASE, AMYLASE in the last 168 hours. No results for input(s): AMMONIA in the last 168 hours. CBC: Recent Labs  Lab 02/10/20 0228 02/11/20 0141 02/12/20 0154 02/13/20 0252 02/14/20 0201  WBC 11.8* 12.1*  12.0* 12.3* 11.8*  NEUTROABS  --   --   --  7.3 6.9  HGB 8.2* 7.9* 8.5* 8.8* 8.2*  HCT 26.4* 25.5* 26.5* 27.8* 25.7*  MCV 88.3 90.1 88.6 89.4 89.2  PLT 357 387 396 405* 357   Cardiac Enzymes: No results for input(s): CKTOTAL, CKMB, CKMBINDEX, TROPONINI in the last 168 hours. CBG: Recent Labs  Lab 02/13/20 0829  GLUCAP 90    Studies/Results: CT ABDOMEN PELVIS W CONTRAST  Result Date: 02/14/2020 CLINICAL DATA:  Follow-up diverticulitis  EXAM: CT ABDOMEN AND PELVIS WITH CONTRAST TECHNIQUE: Multidetector CT imaging of the abdomen and pelvis was performed using the standard protocol following bolus administration of intravenous contrast. CONTRAST:  159mL OMNIPAQUE IOHEXOL 300 MG/ML  SOLN COMPARISON:  02/09/2020 FINDINGS: Lower chest: No acute abnormality. Hepatobiliary: Gallbladder is well distended with vicarious excretion of contrast. The liver is within normal limits. Pancreas: Unremarkable. No pancreatic ductal dilatation or surrounding inflammatory changes. Spleen: Normal in size without focal abnormality. Adrenals/Urinary Tract: Adrenal glands are within normal limits. Kidneys demonstrate small cysts on delayed images. No obstructive changes are seen. Normal excretion of contrast is noted. Bladder is decompressed. Peritoneal dialysis catheter is noted in the pelvis stable from the prior exam. Stomach/Bowel: Changes of diverticulosis are again seen. Persistent wall thickening sigmoid colon is noted. The degree of pericolonic inflammatory change is decreased in the area of fluid collection has also decreased in the interval from the prior exam. Persistent soft tissue density remains in this region between the sigmoid and rectum. More proximal colon is within normal limits. The appendix is unremarkable. Small bowel and stomach are unremarkable. Vascular/Lymphatic: Aortic atherosclerosis. No enlarged abdominal or pelvic lymph nodes. Reproductive: Uterus is not well visualized and likely surgically removed. No adnexal mass is noted. Other: Small fat containing anterior abdominal wall hernias are noted and stable. Musculoskeletal: Degenerative changes of the sacroiliac joints are noted bilaterally. IMPRESSION: Continued improvement in the degree of sigmoid inflammatory change when compared with the prior exam. The fluid collection seen previously has decreased as well. Persistent soft tissue density is noted in this region. Continued follow-up can be  performed as clinically indicated. The remainder of the exam is stable from the prior study. Electronically Signed   By: Inez Catalina M.D.   On: 02/14/2020 08:09   Medications: . sodium chloride 500 mL (02/14/20 0438)  . piperacillin-tazobactam (ZOSYN)  IV 2.25 g (02/14/20 0438)   . Chlorhexidine Gluconate Cloth  6 each Topical Q0600  . darbepoetin (ARANESP) injection - DIALYSIS  200 mcg Intravenous Q Tue-HD  . ferric citrate  420 mg Oral TID WC  . gabapentin  300 mg Oral BID  . heparin injection (subcutaneous)  5,000 Units Subcutaneous Q8H  . influenza vac split quadrivalent PF  0.5 mL Intramuscular Tomorrow-1000  . pantoprazole  40 mg Oral Q0600

## 2020-02-14 NOTE — Progress Notes (Signed)
[facility] Progress Note     Subjective: CC-  Continues to have some suprapubic discomfort, but it is improved. Nausea also persists but is less. Tolerated some sips of broth and gingerale last night without emesis. She had 2 loose stools yesterday.  WBC slightly down 11.8 CT today shows persistent wall thickening of the sigmoid colon but the degree of pericolonic inflammatory change is decreased in the area of fluid collection and this fluid collection has also decreased from the prior exam.  States that he right labia feels a little less swollen today. It is still tender. No active drainage.  Objective: Vital signs in last 24 hours: Temp:  [98.6 F (37 C)-98.7 F (37.1 C)] 98.6 F (37 C) (02/09 0759) Pulse Rate:  [92-102] 102 (02/09 0431) Resp:  [16-19] 19 (02/09 0759) BP: (108-142)/(62-84) 108/71 (02/09 0759) SpO2:  [100 %] 100 % (02/09 0759) Weight:  [98.3 kg-99.9 kg] 98.3 kg (02/09 0759) Last BM Date: 02/13/20  Intake/Output from previous day: No intake/output data recorded. Intake/Output this shift: No intake/output data recorded.  PE: Gen: Awake and alert, NAD Resp:normal rate and effort SW:FUXNA, soft, mild tendernessin thesuprapubicregion withoutguarding orrigidity.Positive bowel sounds. PD catheter with dressing overlying GU: exam deferred as patient is in dialysis  Lab Results:  Recent Labs    02/13/20 0252 02/14/20 0201  WBC 12.3* 11.8*  HGB 8.8* 8.2*  HCT 27.8* 25.7*  PLT 405* 357   BMET Recent Labs    02/13/20 0252 02/14/20 0201  NA 135 133*  K 3.9 3.8  CL 95* 95*  CO2 23 19*  GLUCOSE 86 83  BUN 12 21*  CREATININE 6.84* 9.51*  CALCIUM 10.4* 10.4*   PT/INR No results for input(s): LABPROT, INR in the last 72 hours. CMP     Component Value Date/Time   NA 133 (L) 02/14/2020 0201   K 3.8 02/14/2020 0201   CL 95 (L) 02/14/2020 0201   CO2 19 (L) 02/14/2020 0201   GLUCOSE 83 02/14/2020 0201   BUN 21 (H) 02/14/2020  0201   CREATININE 9.51 (H) 02/14/2020 0201   CALCIUM 10.4 (H) 02/14/2020 0201   PROT 7.6 02/14/2020 0201   ALBUMIN 3.1 (L) 02/14/2020 0201   AST 16 02/14/2020 0201   ALT 12 02/14/2020 0201   ALKPHOS 37 (L) 02/14/2020 0201   BILITOT 0.8 02/14/2020 0201   GFRNONAA 5 (L) 02/14/2020 0201   GFRAA 4 (L) 05/14/2019 2254   Lipase     Component Value Date/Time   LIPASE 34 05/11/2019 0515       Studies/Results: CT ABDOMEN PELVIS W CONTRAST  Result Date: 02/14/2020 CLINICAL DATA:  Follow-up diverticulitis EXAM: CT ABDOMEN AND PELVIS WITH CONTRAST TECHNIQUE: Multidetector CT imaging of the abdomen and pelvis was performed using the standard protocol following bolus administration of intravenous contrast. CONTRAST:  138mL OMNIPAQUE IOHEXOL 300 MG/ML  SOLN COMPARISON:  02/09/2020 FINDINGS: Lower chest: No acute abnormality. Hepatobiliary: Gallbladder is well distended with vicarious excretion of contrast. The liver is within normal limits. Pancreas: Unremarkable. No pancreatic ductal dilatation or surrounding inflammatory changes. Spleen: Normal in size without focal abnormality. Adrenals/Urinary Tract: Adrenal glands are within normal limits. Kidneys demonstrate small cysts on delayed images. No obstructive changes are seen. Normal excretion of contrast is noted. Bladder is decompressed. Peritoneal dialysis catheter is noted in the pelvis stable from the prior exam. Stomach/Bowel: Changes of diverticulosis are again seen. Persistent wall thickening sigmoid colon is noted. The degree of pericolonic inflammatory change is decreased in the  area of fluid collection has also decreased in the interval from the prior exam. Persistent soft tissue density remains in this region between the sigmoid and rectum. More proximal colon is within normal limits. The appendix is unremarkable. Small bowel and stomach are unremarkable. Vascular/Lymphatic: Aortic atherosclerosis. No enlarged abdominal or pelvic lymph nodes.  Reproductive: Uterus is not well visualized and likely surgically removed. No adnexal mass is noted. Other: Small fat containing anterior abdominal wall hernias are noted and stable. Musculoskeletal: Degenerative changes of the sacroiliac joints are noted bilaterally. IMPRESSION: Continued improvement in the degree of sigmoid inflammatory change when compared with the prior exam. The fluid collection seen previously has decreased as well. Persistent soft tissue density is noted in this region. Continued follow-up can be performed as clinically indicated. The remainder of the exam is stable from the prior study. Electronically Signed   By: Inez Catalina M.D.   On: 02/14/2020 08:09    Anti-infectives: Anti-infectives (From admission, onward)   Start     Dose/Rate Route Frequency Ordered Stop   02/06/20 2000  piperacillin-tazobactam (ZOSYN) IVPB 3.375 g  Status:  Discontinued       Note to Pharmacy: Zosyn 3.375 g IV q12h in ESRD on HD   3.375 g 12.5 mL/hr over 240 Minutes Intravenous Every 12 hours 02/06/20 0352 02/06/20 0806   02/06/20 1200  piperacillin-tazobactam (ZOSYN) IVPB 2.25 g       Note to Pharmacy: Zosyn 3.375 g IV q12h in ESRD on HD   2.25 g 100 mL/hr over 30 Minutes Intravenous Every 8 hours 02/06/20 0806     02/06/20 0245  piperacillin-tazobactam (ZOSYN) IVPB 3.375 g        3.375 g 100 mL/hr over 30 Minutes Intravenous  Once 02/06/20 0238 02/06/20 0415       Assessment/Plan End-stage renal disease(PD >>HD MWF) - stitch from prior surgery removed today GI bleed -? Diverticular related. GI has seen Hypertension Acute on chronic anemia Right labial abscess-seen by GYN 2/7, no indication for I&D, continue abx and warm compresses - Defer to TRH -  Suspected diverticulitis with pericolonic abscess between the rectum and sigmoid - CT 2/1 w/Changes of diverticulosis with a fluid collection posterior to the sigmoid and anterior to the rectum as described suspicious for small  pericolonic abscess likely arising from the posterior wall of the sigmoid colon. - Repeat CT 2/4 w/ persistent wall thickening of the sigmoid colon and rectum with surrounding infiltrative changes consistent with diverticulitis. Decreased size of a small pericolic diverticular abscess collection, nowmeasuring16 x 15 mm.Appears too small to drain - Repeat CT 2/9 with persistent wall thickening of the sigmoid colon but the degree of pericolonic inflammatory change is decreased in the area of fluid collection and this fluid collection has also decreased from the prior exam - WBC 13.8>> 14.8>> 13.3>> 13.2>>10.8>>11.8>>12.1>>12.0>>12.3>>11.8 - Hopefully will improve with conservative therapies and be able to avoid surgery during admission.  FEN:CLD ID: Zosyn 2/1 >> day#9 QXI:HWTU, heparin subq Follow-up: TBD  Plan: Overall CT scan does show some improvement (as described above), she is less tender on abdominal exam, and WBC is down trending. Will try clear liquid diet tray this morning and add probiotic. Plan to check this afternoon and possibly advance to full liquids. We will also check right labial abscess at that time. Continue IV zosyn.    LOS: 8 days    Wellington Hampshire, [facility] 02/14/2020, 9:41 AM Please see Amion for pager number during day hours  7:00am-4:30pm

## 2020-02-14 NOTE — Progress Notes (Signed)
[facility] Progress Note  Patient: Patricia [name]    WEX:[id number]  DOA: 02/05/2020     Date of Service: the patient was seen and examined on 02/14/2020  Brief hospital course: Past medical history of ESRD on HD MWF, HTN, anxiety, recurrent GI bleed.  Presents with complaints of abdominal pain and GI bleed found to have pericolonic abscess as well as labial abscess currently on IV antibiotics. For GI bleed, GI was consulted.  Conservative measures recommended.  Currently signed off General surgery following for pericolonic abscess.  Currently on IV antibiotics. Nephrology consulted for HD. Currently plan is continue IV antibiotics.  CT abdomen tomorrow.   Assessment and Plan: 1.  Suspected diverticulitis with pericolonic abscess between the rectum and sigmoid. Labial abscess CT on 2/1 shows fluid collection posterior to sigmoid. With leukocytosis on admission. IR was consulted, abscess not amenable to drain. Patient was started on IV Zosyn. Repeat CT 2/9 with persistent wall thickening of the sigmoid colon but the degree of pericolonic inflammatory change is decreased in the area of fluid collection and this fluid collection has also decreased from the prior exam Continue IV antibiotics until improvement Colonoscopy in 6 weeks. GYN consulted for labial abscess. Recommended conservative management.  ESR >140 and CRP elevated.  Diet currently advanced per surgery.  2.  GI bleed Appreciate GI assistance, currently signed off. Outpatient follow-up recommended. Treated conservatively.  GI bleed currently resolved. No plan for endoscopic evaluation at this time. Colonoscopy evaluation via colonoscopy or flexible sigmoidoscopy in 6 weeks for her pericolonic abscess. Patient has seen [facility], [facility] in the past.   Seen by [facility] during this admission.  3.  ESRD on MWF HD Initially was on PD.  Currently transition to HD due to poor clearance. Management of HD per  nephrology. Flush PD catheter weekly to maintain patency. Electrolyte and volume management per nephrology via HD.  4.  Rapid eye fluttering Neurology was consulted. 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 MRI did not show any acute abnormality. B12 normal. EEG showed no evidence of active seizures. Outpatient work-up recommended with neurology.  No further work-up or treatment in the hospital.  5.  Chronic neuropathy On gabapentin. Continue.  6.  HTN Blood pressure stable. Continue current regimen.  7.  HLD Continue statin.  8.  Obesity. Placing the patient at high risk of poor outcome.  Monitor. Body mass index is 33.25 kg/m.   Diet: Clear liquid diet DVT Prophylaxis:   heparin injection 5,000 Units Start: 02/11/20 1430 SCDs Start: 02/06/20 9381   Advance goals of care discussion: Full code  Family Communication: no family was present at bedside, at the time of interview.   Disposition:  Status is: Inpatient  Remains inpatient appropriate because:Ongoing active pain requiring inpatient pain management monitor for improvement in clinical symptoms of abdominal pain and labial abscess.  Dispo: The patient is from: Home              Anticipated d/c is to: Home              Anticipated d/c date is: 3 days              Patient currently is not medically stable to d/c.   Difficult to place patient   Subjective: No nausea no vomiting.  No fever no chills no chest pain.  Physical Exam:  Vitals:   02/14/20 1000 02/14/20 1030 02/14/20 1050 02/14/20 1136  BP: 107/72 98/60 120/73 121/83  Pulse:    100  Resp:  16  18  Temp:   98 F (36.7 C) 98.1 F (36.7 C)  TempSrc:   Oral Oral  SpO2:   100% 100%  Weight:   96.3 kg   Height:       No intake or output data in the 24 hours ending 02/14/20 1654 Filed Weights   02/14/20 0431 02/14/20 0759 02/14/20 1050  Weight: 99.9 kg 98.3 kg 96.3 kg    Data Reviewed: I have personally reviewed and interpreted daily  labs, tele strips, imaging. I reviewed all nursing notes, pharmacy notes, vitals, pertinent old records I have discussed plan of care as described above with RN and patient/family.  CBC: Recent Labs  Lab 02/10/20 0228 02/11/20 0141 02/12/20 0154 02/13/20 0252 02/14/20 0201  WBC 11.8* 12.1* 12.0* 12.3* 11.8*  NEUTROABS  --   --   --  7.3 6.9  HGB 8.2* 7.9* 8.5* 8.8* 8.2*  HCT 26.4* 25.5* 26.5* 27.8* 25.7*  MCV 88.3 90.1 88.6 89.4 89.2  PLT 357 387 396 405* 144   Basic Metabolic Panel: Recent Labs  Lab 02/08/20 2105 02/09/20 0342 02/10/20 0228 02/11/20 0141 02/12/20 1140 02/13/20 0252 02/14/20 0201  NA  --    < > 134* 134* 137 135 133*  K  --    < > 3.7 3.6 3.8 3.9 3.8  CL  --    < > 94* 94* 97* 95* 95*  CO2  --    < > _0 19*  GLUCOSE  --    < > 89 86 92 86 83  BUN  --    < > 13 21* 18 12 21*  CREATININE  --    < > 7.76* 9.83* 9.16* 6.84* 9.51*  CALCIUM  --    < > 9.8 10.1 10.2 10.4* 10.4*  MG 2.0  --   --   --   --  2.0 2.0  PHOS  --   --   --   --  8.1*  --   --    < > = values in this interval not displayed.    Studies: CT ABDOMEN PELVIS W CONTRAST  Result Date: 02/14/2020 CLINICAL DATA:  Follow-up diverticulitis EXAM: CT ABDOMEN AND PELVIS WITH CONTRAST TECHNIQUE: Multidetector CT imaging of the abdomen and pelvis was performed using the standard protocol following bolus administration of intravenous contrast. CONTRAST:  176m OMNIPAQUE IOHEXOL 300 MG/ML  SOLN COMPARISON:  02/09/2020 FINDINGS: Lower chest: No acute abnormality. Hepatobiliary: Gallbladder is well distended with vicarious excretion of contrast. The liver is within normal limits. Pancreas: Unremarkable. No pancreatic ductal dilatation or surrounding inflammatory changes. Spleen: Normal in size without focal abnormality. Adrenals/Urinary Tract: Adrenal glands are within normal limits. Kidneys demonstrate small cysts on delayed images. No obstructive changes are seen. Normal excretion of contrast is  noted. Bladder is decompressed. Peritoneal dialysis catheter is noted in the pelvis stable from the prior exam. Stomach/Bowel: Changes of diverticulosis are again seen. Persistent wall thickening sigmoid colon is noted. The degree of pericolonic inflammatory change is decreased in the area of fluid collection has also decreased in the interval from the prior exam. Persistent soft tissue density remains in this region between the sigmoid and rectum. More proximal colon is within normal limits. The appendix is unremarkable. Small bowel and stomach are unremarkable. Vascular/Lymphatic: Aortic atherosclerosis. No enlarged abdominal or pelvic lymph nodes. Reproductive: Uterus is not well visualized and likely surgically removed. No adnexal mass  is noted. Other: Small fat containing anterior abdominal wall hernias are noted and stable. Musculoskeletal: Degenerative changes of the sacroiliac joints are noted bilaterally. IMPRESSION: Continued improvement in the degree of sigmoid inflammatory change when compared with the prior exam. The fluid collection seen previously has decreased as well. Persistent soft tissue density is noted in this region. Continued follow-up can be performed as clinically indicated. The remainder of the exam is stable from the prior study. Electronically Signed   By: Inez Catalina M.D.   On: 02/14/2020 08:09    Scheduled Meds: . Chlorhexidine Gluconate Cloth  6 each Topical Q0600  . Darbepoetin Alfa      . darbepoetin (ARANESP) injection - DIALYSIS  200 mcg Intravenous Q Wed-HD  . feeding supplement  1 Container Oral TID BM  . gabapentin  300 mg Oral BID  . heparin injection (subcutaneous)  5,000 Units Subcutaneous Q8H  . heparin sodium (porcine)      . influenza vac split quadrivalent PF  0.5 mL Intramuscular Tomorrow-1000  . multivitamin  1 tablet Oral QHS  . pantoprazole  40 mg Oral Q0600  . saccharomyces boulardii  250 mg Oral BID  . sevelamer carbonate  2.4 g Oral TID WC    Continuous Infusions: . sodium chloride 500 mL (02/14/20 0438)  . piperacillin-tazobactam (ZOSYN)  IV 2.25 g (02/14/20 1212)   PRN Meds: sodium chloride, acetaminophen **OR** acetaminophen, ALPRAZolam, hydrOXYzine, labetalol, LORazepam, ondansetron **OR** ondansetron (ZOFRAN) IV, traMADol  Time spent: 35 minutes  Author: Berle Mull, MD [facility] 02/14/2020 4:54 PM  To reach On-call, see care teams to locate the attending and reach out via [email or web address]. Between 7PM-7AM, please contact night-coverage If you still have difficulty reaching the attending provider, please page the [facility] (Director on Call) for [facility] on amion for assistance.

## 2020-02-15 LAB — COMPREHENSIVE METABOLIC PANEL
ALT: 12 U/L (ref 0–44)
AST: 16 U/L (ref 15–41)
Albumin: 2.9 g/dL — ABNORMAL LOW (ref 3.5–5.0)
Alkaline Phosphatase: 39 U/L (ref 38–126)
Anion gap: 17 — ABNORMAL HIGH (ref 5–15)
BUN: 9 mg/dL (ref 6–20)
CO2: 24 mmol/L (ref 22–32)
Calcium: 10 mg/dL (ref 8.9–10.3)
Chloride: 96 mmol/L — ABNORMAL LOW (ref 98–111)
Creatinine, Ser: 6.82 mg/dL — ABNORMAL HIGH (ref 0.44–1.00)
GFR, Estimated: 7 mL/min — ABNORMAL LOW (ref >60)
Glucose, Bld: 87 mg/dL (ref 70–99)
Potassium: 3.2 mmol/L — ABNORMAL LOW (ref 3.5–5.1)
Sodium: 137 mmol/L (ref 135–145)
Total Bilirubin: 0.5 mg/dL (ref 0.3–1.2)
Total Protein: 7.1 g/dL (ref 6.5–8.1)

## 2020-02-15 LAB — CBC WITH DIFFERENTIAL/PLATELET
Abs Immature Granulocytes: 0.3 10*3/uL — ABNORMAL HIGH (ref 0.00–0.07)
Basophils Absolute: 0.1 10*3/uL (ref 0.0–0.1)
Basophils Relative: 1 %
Eosinophils Absolute: 0.2 10*3/uL (ref 0.0–0.5)
Eosinophils Relative: 2 %
HCT: 26.3 % — ABNORMAL LOW (ref 36.0–46.0)
Hemoglobin: 8 g/dL — ABNORMAL LOW (ref 12.0–15.0)
Immature Granulocytes: 3 %
Lymphocytes Relative: 30 %
Lymphs Abs: 2.8 10*3/uL (ref 0.7–4.0)
MCH: 27.7 pg (ref 26.0–34.0)
MCHC: 30.4 g/dL (ref 30.0–36.0)
MCV: 91 fL (ref 80.0–100.0)
Monocytes Absolute: 1 10*3/uL (ref 0.1–1.0)
Monocytes Relative: 11 %
Neutro Abs: 5.1 10*3/uL (ref 1.7–7.7)
Neutrophils Relative %: 53 %
Platelets: 346 10*3/uL (ref 150–400)
RBC: 2.89 MIL/uL — ABNORMAL LOW (ref 3.87–5.11)
RDW: 16.3 % — ABNORMAL HIGH (ref 11.5–15.5)
WBC: 9.5 10*3/uL (ref 4.0–10.5)
nRBC: 1.2 % — ABNORMAL HIGH (ref 0.0–0.2)

## 2020-02-15 LAB — C-REACTIVE PROTEIN: CRP: 4.3 mg/dL — ABNORMAL HIGH (ref <1.0)

## 2020-02-15 LAB — MAGNESIUM: Magnesium: 1.9 mg/dL (ref 1.7–2.4)

## 2020-02-15 MED ORDER — POTASSIUM CHLORIDE CRYS ER 20 MEQ PO TBCR
40.0000 meq | EXTENDED_RELEASE_TABLET | ORAL | Status: AC
  Administered 2020-02-15 (×2): 40 meq via ORAL
  Filled 2020-02-15 (×2): qty 2

## 2020-02-15 MED ORDER — HYDRALAZINE HCL 25 MG PO TABS
25.0000 mg | ORAL_TABLET | Freq: Two times a day (BID) | ORAL | Status: DC
  Administered 2020-02-15 – 2020-02-16 (×3): 25 mg via ORAL
  Filled 2020-02-15 (×3): qty 1

## 2020-02-15 NOTE — Progress Notes (Addendum)
Subjective: Seen and examined in room, reports abdominal discomfort  resolving, said tolerated dialysis yesterday on schedule  Objective Vital signs in last 24 hours: Vitals:   02/14/20 1050 02/14/20 1136 02/14/20 2007 02/15/20 0521  BP: 120/73 121/83 (!) 143/88 (!) 160/99  Pulse:  100 96 (!) 105  Resp: $Remo'18 18  16  'ViXYM$ Temp: 98 F (36.7 C) 98.1 F (36.7 C) 98.1 F (36.7 C) 98.5 F (36.9 C)  TempSrc: Oral Oral Oral Oral  SpO2: 100% 100% 100% 100%  Weight: 96.3 kg   96.8 kg  Height:       Weight change: -1.6 kg  Physical Exam General:Obese, chronically ill appearing female.NAD Heart:RRR, no mrg Lungs:CTAB,unlabored breathing Abdomen:soft, +normoactive bowel sounds,NT, ND, PD catheter dressing dry clean no discharge noted Extremities:no LE edema Dialysis Access:R IJ TDC, PD cath   [facility], 4 hours, BFR 400/DFR 800, EDW 97.5kg, 2K/2Ca, UF profile 2, TDC mircera 286mcg q 2 weeks- last dose 02/05/20  Problem/Plan: 1. Pericolonic abscess:CCS and GI following, on zosyn,surgical intervention planned.GIconsulted. CT2/05showed decrease in size of abscess.  Noted per admit CT scan 0209= continue improvement in degree of sigmoid inflammatory change.  Plans for admit/CCS/GI Plan for colonoscopy vs flex sig in 6 weeks as OP.  2. Acute lower GI bleed: A.m. Hgb 8.2 secondary to #1 above. Received 1 unit PRBC.Marland Kitchen Recently received max dose of ESA.  As outpatient continue today.  Transfuse PRN.TSAT 28 no IV iron secondary infection 3. [place] PD now on HD (MWF)/ recently transitioned to HD due to feeling poorly/insufficient clearances with PD,agreeable to continue HD for now.On MWF schedule.Will need to flush PD cath weekly to maintain patency.  No heparin with dialysis with history of GI bleed 4. Hypokalemia:.K 3.2.Noted order for p.o. supplement, follow-up predialysis tomorrow use 4 K  bath 5. Hypertension/volume:BPnoted rising back up 160/99  / yesterday HD UF goal decreased as not taking much p.o. no edema on exam.,  Noted priorhome BP meds DC'dhydralazine 50 mg twice daily,   restart at 25 mg twice daily hold a.m. of dialysis follow-up BP trend please avoid additional IVF as much as possible. 6. Metabolic bone disease:^Corrected calcium.Phosphorus 8.1 not on VDRA.  Tells me she was on Renvela powder as binder resume  7. 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 Wednesday dialysis dose today using max Mircera as an outpatient, transferrin sat 28% no IV iron with infection 8. Rapid eye fluttering/deviation somewhat better this a.m., admit team consult neruo . MRI with &w/o contrast02/05prior to HD with no acute abnormalities. EEGnormal. Plan for additional work up as outpatientper neuro  Ernest Haber, PA-C [place] [phone] 02/15/2020,9:35 AM  LOS: 9 days   Labs: Basic Metabolic Panel: Recent Labs  Lab 02/12/20 1140 02/13/20 0252 02/14/20 0201 02/15/20 0158  NA 137 135 133* 137  K 3.8 3.9 3.8 3.2*  CL 97* 95* 95* 96*  CO2 22 23 19* 24  GLUCOSE 92 86 83 87  BUN 18 12 21* 9  CREATININE 9.16* 6.84* 9.51* 6.82*  CALCIUM 10.2 10.4* 10.4* 10.0  PHOS 8.1*  --   --   --    Liver Function Tests: Recent Labs  Lab 02/13/20 0252 02/14/20 0201 02/15/20 0158  AST $Re'16 16 16  'Vzi$ ALT $R'13 12 12  'pY$ ALKPHOS 43 37* 39  BILITOT 0.8 0.8 0.5  PROT 8.1 7.6 7.1  ALBUMIN 3.2* 3.1* 2.9*   No results for input(s): LIPASE, AMYLASE in the last 168  hours. No results for input(s): AMMONIA in the last 168 hours. CBC: Recent Labs  Lab 02/11/20 0141 02/12/20 0154 02/13/20 0252 02/14/20 0201 02/15/20 0158  WBC 12.1* 12.0* 12.3* 11.8* 9.5  NEUTROABS  --   --  7.3 6.9 5.1  HGB 7.9* 8.5* 8.8* 8.2* 8.0*  HCT 25.5* 26.5* 27.8* 25.7* 26.3*  MCV 90.1 88.6 89.4 89.2 91.0  PLT 387 396 405* 357 346   Cardiac Enzymes: No results for  input(s): CKTOTAL, CKMB, CKMBINDEX, TROPONINI in the last 168 hours. CBG: Recent Labs  Lab 02/13/20 0829  GLUCAP 90    Studies/Results: CT ABDOMEN PELVIS W CONTRAST  Result Date: 02/14/2020 CLINICAL DATA:  Follow-up diverticulitis EXAM: CT ABDOMEN AND PELVIS WITH CONTRAST TECHNIQUE: Multidetector CT imaging of the abdomen and pelvis was performed using the standard protocol following bolus administration of intravenous contrast. CONTRAST:  157mL OMNIPAQUE IOHEXOL 300 MG/ML  SOLN COMPARISON:  02/09/2020 FINDINGS: Lower chest: No acute abnormality. Hepatobiliary: Gallbladder is well distended with vicarious excretion of contrast. The liver is within normal limits. Pancreas: Unremarkable. No pancreatic ductal dilatation or surrounding inflammatory changes. Spleen: Normal in size without focal abnormality. Adrenals/Urinary Tract: Adrenal glands are within normal limits. Kidneys demonstrate small cysts on delayed images. No obstructive changes are seen. Normal excretion of contrast is noted. Bladder is decompressed. Peritoneal dialysis catheter is noted in the pelvis stable from the prior exam. Stomach/Bowel: Changes of diverticulosis are again seen. Persistent wall thickening sigmoid colon is noted. The degree of pericolonic inflammatory change is decreased in the area of fluid collection has also decreased in the interval from the prior exam. Persistent soft tissue density remains in this region between the sigmoid and rectum. More proximal colon is within normal limits. The appendix is unremarkable. Small bowel and stomach are unremarkable. Vascular/Lymphatic: Aortic atherosclerosis. No enlarged abdominal or pelvic lymph nodes. Reproductive: Uterus is not well visualized and likely surgically removed. No adnexal mass is noted. Other: Small fat containing anterior abdominal wall hernias are noted and stable. Musculoskeletal: Degenerative changes of the sacroiliac joints are noted bilaterally. IMPRESSION:  Continued improvement in the degree of sigmoid inflammatory change when compared with the prior exam. The fluid collection seen previously has decreased as well. Persistent soft tissue density is noted in this region. Continued follow-up can be performed as clinically indicated. The remainder of the exam is stable from the prior study. Electronically Signed   By: Inez Catalina M.D.   On: 02/14/2020 08:09   Medications: . sodium chloride 250 mL (02/14/20 2030)  . piperacillin-tazobactam (ZOSYN)  IV Stopped (02/15/20 0601)   . Chlorhexidine Gluconate Cloth  6 each Topical Q0600  . darbepoetin (ARANESP) injection - DIALYSIS  200 mcg Intravenous Q Wed-HD  . feeding supplement  1 Container Oral TID BM  . gabapentin  300 mg Oral BID  . heparin injection (subcutaneous)  5,000 Units Subcutaneous Q8H  . influenza vac split quadrivalent PF  0.5 mL Intramuscular Tomorrow-1000  . multivitamin  1 tablet Oral QHS  . pantoprazole  40 mg Oral Q0600  . potassium chloride  40 mEq Oral Q2H  . saccharomyces boulardii  250 mg Oral BID  . sevelamer carbonate  2.4 g Oral TID WC

## 2020-02-15 NOTE — Progress Notes (Addendum)
[facility] Progress Note  Patient: Patricia [name]    VOP:[id number]  DOA: 02/05/2020     Date of Service: the patient was seen and examined on 02/15/2020  Brief hospital course: Past medical history of ESRD on HD MWF, HTN, anxiety, recurrent GI bleed.  Presents with complaints of abdominal pain and GI bleed found to have pericolonic abscess as well as labial abscess currently on IV antibiotics. For GI bleed, GI was consulted.  Conservative measures recommended.  Currently signed off General surgery following for pericolonic abscess.  Currently on IV antibiotics. Nephrology consulted for HD. Currently plan is continue IV antibiotics.  Monitor for tolerating oral diet  Assessment and Plan: 1.  Suspected diverticulitis with pericolonic abscess between the rectum and sigmoid. Labial abscess CT on 2/1 shows fluid collection posterior to sigmoid. With leukocytosis on admission. IR was consulted, abscess not amenable to drain. Patient was started on IV Zosyn. Repeat CT 2/9 with persistent wall thickening of the sigmoid colon but the degree of pericolonic inflammatory change is decreased in the area of fluid collection and this fluid collection has also decreased from the prior exam Continue IV antibiotics until improvement Colonoscopy in 6 weeks. GYN consulted for labial abscess. Recommended conservative management.  ESR >140 and CRP elevated.  Diet currently advanced per surgery.  2.  GI bleed Appreciate GI assistance, currently signed off. Outpatient follow-up recommended. Treated conservatively.  GI bleed currently resolved. No plan for endoscopic evaluation at this time. Evaluation via colonoscopy or flexible sigmoidoscopy in 6 weeks for her pericolonic abscess. Patient has seen [facility], [facility] in the past. Seen by [facility] during this admission.  3.  ESRD on MWF HD Initially was on PD.  Currently transition to HD due to poor clearance. Management of HD per  nephrology. Flush PD catheter weekly to maintain patency. Electrolyte and volume management per nephrology via HD.  4.  Rapid eye fluttering Neurology was consulted. 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 MRI did not show any acute abnormality. B12 normal. EEG showed no evidence of active seizures. Outpatient work-up recommended with neurology.  No further work-up or treatment in the hospital.  5.  Chronic neuropathy On gabapentin. Continue.  6.  HTN Blood pressure stable. Continue current regimen.  7.  HLD Continue statin.  8.  Obesity. Placing the patient at high risk of poor outcome.  Monitor. Body mass index is 33.42 kg/m.   9.  Hypokalemia. Replaced.  Diet: Full liquid diet DVT Prophylaxis:   heparin injection 5,000 Units Start: 02/11/20 1430 SCDs Start: 02/06/20 6381   Advance goals of care discussion: Full code  Family Communication: no family was present at bedside, at the time of interview.   Disposition:  Status is: Inpatient  Remains inpatient appropriate because:Ongoing active pain requiring inpatient pain management monitor for improvement in clinical symptoms of abdominal pain and labial abscess.  Dispo: The patient is from: Home              Anticipated d/c is to: Home              Anticipated d/c date is: 3 days              Patient currently is not medically stable to d/c.   Difficult to place patient   Subjective: Abdominal pain improving.  Nausea resolved.  No diarrhea reported.  Physical Exam:  General: Appear in mild distress, no Rash; Oral Mucosa Clear, moist. no Abnormal Neck Mass Or lumps,  Conjunctiva normal  Cardiovascular: S1 and S2 Present, no Murmur, Respiratory: good respiratory effort, Bilateral Air entry present and CTA, no Crackles, no wheezes Abdomen: Bowel Sound present, Soft and mild tenderness Extremities: no Pedal edema Neurology: alert and oriented to time, place, and person affect appropriate. no new focal  deficit Gait not checked due to patient safety concerns   Vitals:   02/14/20 1050 02/14/20 1136 02/14/20 2007 02/15/20 0521  BP: 120/73 121/83 (!) 143/88 (!) 160/99  Pulse:  100 96 (!) 105  Resp: _0 Temp: 98 F (36.7 C) 98.1 F (36.7 C) 98.1 F (36.7 C) 98.5 F (36.9 C)  TempSrc: Oral Oral Oral Oral  SpO2: 100% 100% 100% 100%  Weight: 96.3 kg   96.8 kg  Height:       No intake or output data in the 24 hours ending 02/15/20 1653 Filed Weights   02/14/20 0759 02/14/20 1050 02/15/20 0521  Weight: 98.3 kg 96.3 kg 96.8 kg    Data Reviewed: I have personally reviewed and interpreted daily labs, tele strips, imaging. I reviewed all nursing notes, pharmacy notes, vitals, pertinent old records I have discussed plan of care as described above with RN and patient/family.  CBC: Recent Labs  Lab 02/11/20 0141 02/12/20 0154 02/13/20 0252 02/14/20 0201 02/15/20 0158  WBC 12.1* 12.0* 12.3* 11.8* 9.5  NEUTROABS  --   --  7.3 6.9 5.1  HGB 7.9* 8.5* 8.8* 8.2* 8.0*  HCT 25.5* 26.5* 27.8* 25.7* 26.3*  MCV 90.1 88.6 89.4 89.2 91.0  PLT 387 396 405* 357 833   Basic Metabolic Panel: Recent Labs  Lab 02/08/20 2105 02/09/20 0342 02/11/20 0141 02/12/20 1140 02/13/20 0252 02/14/20 0201 02/15/20 0158  NA  --    < > 134* 137 135 133* 137  K  --    < > 3.6 3.8 3.9 3.8 3.2*  CL  --    < > 94* 97* 95* 95* 96*  CO2  --    < > _1 19* 24  GLUCOSE  --    < > 86 92 86 83 87  BUN  --    < > 21* 18 12 21* 9  CREATININE  --    < > 9.83* 9.16* 6.84* 9.51* 6.82*  CALCIUM  --    < > 10.1 10.2 10.4* 10.4* 10.0  MG 2.0  --   --   --  2.0 2.0 1.9  PHOS  --   --   --  8.1*  --   --   --    < > = values in this interval not displayed.    Studies: No results found.  Scheduled Meds: . Chlorhexidine Gluconate Cloth  6 each Topical Q0600  . darbepoetin (ARANESP) injection - DIALYSIS  200 mcg Intravenous Q Wed-HD  . feeding supplement  1 Container Oral TID BM  . gabapentin  300 mg  Oral BID  . heparin injection (subcutaneous)  5,000 Units Subcutaneous Q8H  . hydrALAZINE  25 mg Oral BID  . influenza vac split quadrivalent PF  0.5 mL Intramuscular Tomorrow-1000  . multivitamin  1 tablet Oral QHS  . pantoprazole  40 mg Oral Q0600  . saccharomyces boulardii  250 mg Oral BID  . sevelamer carbonate  2.4 g Oral TID WC   Continuous Infusions: . sodium chloride 250 mL (02/14/20 2030)  . piperacillin-tazobactam (ZOSYN)  IV 2.25 g (02/15/20 1035)   PRN Meds: sodium chloride, acetaminophen **OR** acetaminophen,  ALPRAZolam, hydrOXYzine, labetalol, LORazepam, ondansetron **OR** ondansetron (ZOFRAN) IV, traMADol, zolpidem  Time spent: 35 minutes  Author: Berle Mull, MD [facility] 02/15/2020 4:53 PM  To reach On-call, see care teams to locate the attending and reach out via [email or web address]. Between 7PM-7AM, please contact night-coverage If you still have difficulty reaching the attending provider, please page the [facility] (Director on Call) for [facility] on amion for assistance.

## 2020-02-15 NOTE — Progress Notes (Signed)
Subjective: CC: Doing well this morning. Nausea has resolved. Tolerating clears without increased abdominal pain. BM yesterday that she reports was liquidy. Reports less pain and swelling to right labia. No drainage.   Objective: Vital signs in last 24 hours: Temp:  [98 F (36.7 C)-98.5 F (36.9 C)] 98.5 F (36.9 C) (02/10 0521) Pulse Rate:  [96-105] 105 (02/10 0521) Resp:  [16-18] 16 (02/10 0521) BP: (90-160)/(60-99) 160/99 (02/10 0521) SpO2:  [100 %] 100 % (02/10 0521) Weight:  [96.3 kg-96.8 kg] 96.8 kg (02/10 0521) Last BM Date: 02/13/20  Intake/Output from previous day: No intake/output data recorded. Intake/Output this shift: No intake/output data recorded.  PE: Gen: Awake and alert, NAD Resp:normal rate and effort Abd: Obese, soft, very mild tendernessin thesuprapubicregion andleft lower quadrant that is improved from yesterday. [place] orrigidity. PD catheter with dressing overlying GU: Chaperone, RN present Janett Billow). There is less swelling on the right labia with asmall areaarea of fluctuance on the inferior foldwithmild surrounding induration superior. Overall improved. No drainage.  Lab Results:  Recent Labs    02/14/20 0201 02/15/20 0158  WBC 11.8* 9.5  HGB 8.2* 8.0*  HCT 25.7* 26.3*  PLT 357 346   BMET Recent Labs    02/14/20 0201 02/15/20 0158  NA 133* 137  K 3.8 3.2*  CL 95* 96*  CO2 19* 24  GLUCOSE 83 87  BUN 21* 9  CREATININE 9.51* 6.82*  CALCIUM 10.4* 10.0   PT/INR No results for input(s): LABPROT, INR in the last 72 hours. CMP     Component Value Date/Time   NA 137 02/15/2020 0158   K 3.2 (L) 02/15/2020 0158   CL 96 (L) 02/15/2020 0158   CO2 24 02/15/2020 0158   GLUCOSE 87 02/15/2020 0158   BUN 9 02/15/2020 0158   CREATININE 6.82 (H) 02/15/2020 0158   CALCIUM 10.0 02/15/2020 0158   PROT 7.1 02/15/2020 0158   ALBUMIN 2.9 (L) 02/15/2020 0158   AST 16 02/15/2020 0158   ALT 12 02/15/2020 0158   ALKPHOS 39  02/15/2020 0158   BILITOT 0.5 02/15/2020 0158   GFRNONAA 7 (L) 02/15/2020 0158   GFRAA 4 (L) 05/14/2019 2254   Lipase     Component Value Date/Time   LIPASE 34 05/11/2019 0515       Studies/Results: CT ABDOMEN PELVIS W CONTRAST  Result Date: 02/14/2020 CLINICAL DATA:  Follow-up diverticulitis EXAM: CT ABDOMEN AND PELVIS WITH CONTRAST TECHNIQUE: Multidetector CT imaging of the abdomen and pelvis was performed using the standard protocol following bolus administration of intravenous contrast. CONTRAST:  178mL OMNIPAQUE IOHEXOL 300 MG/ML  SOLN COMPARISON:  02/09/2020 FINDINGS: Lower chest: No acute abnormality. Hepatobiliary: Gallbladder is well distended with vicarious excretion of contrast. The liver is within normal limits. Pancreas: Unremarkable. No pancreatic ductal dilatation or surrounding inflammatory changes. Spleen: Normal in size without focal abnormality. Adrenals/Urinary Tract: Adrenal glands are within normal limits. Kidneys demonstrate small cysts on delayed images. No obstructive changes are seen. Normal excretion of contrast is noted. Bladder is decompressed. Peritoneal dialysis catheter is noted in the pelvis stable from the prior exam. Stomach/Bowel: Changes of diverticulosis are again seen. Persistent wall thickening sigmoid colon is noted. The degree of pericolonic inflammatory change is decreased in the area of fluid collection has also decreased in the interval from the prior exam. Persistent soft tissue density remains in this region between the sigmoid and rectum. More proximal colon is within normal limits. The appendix is unremarkable. Small bowel and stomach  are unremarkable. Vascular/Lymphatic: Aortic atherosclerosis. No enlarged abdominal or pelvic lymph nodes. Reproductive: Uterus is not well visualized and likely surgically removed. No adnexal mass is noted. Other: Small fat containing anterior abdominal wall hernias are noted and stable. Musculoskeletal: Degenerative  changes of the sacroiliac joints are noted bilaterally. IMPRESSION: Continued improvement in the degree of sigmoid inflammatory change when compared with the prior exam. The fluid collection seen previously has decreased as well. Persistent soft tissue density is noted in this region. Continued follow-up can be performed as clinically indicated. The remainder of the exam is stable from the prior study. Electronically Signed   By: Inez Catalina M.D.   On: 02/14/2020 08:09    Anti-infectives: Anti-infectives (From admission, onward)   Start     Dose/Rate Route Frequency Ordered Stop   02/06/20 2000  piperacillin-tazobactam (ZOSYN) IVPB 3.375 g  Status:  Discontinued       Note to Pharmacy: Zosyn 3.375 g IV q12h in ESRD on HD   3.375 g 12.5 mL/hr over 240 Minutes Intravenous Every 12 hours 02/06/20 0352 02/06/20 0806   02/06/20 1200  piperacillin-tazobactam (ZOSYN) IVPB 2.25 g       Note to Pharmacy: Zosyn 3.375 g IV q12h in ESRD on HD   2.25 g 100 mL/hr over 30 Minutes Intravenous Every 8 hours 02/06/20 0806     02/06/20 0245  piperacillin-tazobactam (ZOSYN) IVPB 3.375 g        3.375 g 100 mL/hr over 30 Minutes Intravenous  Once 02/06/20 0238 02/06/20 0415       Assessment/Plan End-stage renal disease(PD >>HD MWF) -stitchfrom prior surgeryremoved today GI bleed -? Diverticular related. GI has seen Hypertension Acute on chronic anemia Right labial abscess-seen by GYN 2/7, no indication for I&D, continue abx and warm compresses - Defer to TRH -  Suspected diverticulitis with pericolonic abscess between the rectum and sigmoid - CT 2/1 w/Changes of diverticulosis with a fluid collection posterior to the sigmoid and anterior to the rectum as described suspicious for small pericolonic abscess likely arising from the posterior wall of the sigmoid colon. - Repeat CT 2/4 w/ persistent wall thickening of the sigmoid colon and rectum with surrounding infiltrative changes consistent with  diverticulitis. Decreased size of a small pericolic diverticular abscess collection, nowmeasuring16 x 15 mm.Appears too small to drain - Repeat CT 2/9 with persistent wall thickening of the sigmoid colon but the degree of pericolonic inflammatory change is decreased in the area of fluid collection and this fluid collection has also decreased from the prior exam - WBC 13.8>> 14.8>> 13.3>> 13.2>>10.8>>11.8>>12.1>>12.0>>12.3>>11.8 << 9.5 - Hopefully will improve with conservative therapies and be able to avoid surgery during admission.  FEN:FLD ID: Zosyn 2/1 >> day#10 OEU:MPNT, heparin subq Follow-up: TBD  Plan: Overall CT scan does show some improvement (as described above), she is less tender on abdominal exam, and WBC has normalized. She is tolerating cld and nausea has resolved. Adv to FLD and recheck in PM for possible advancement to soft diet. Continue IV zosyn.    LOS: 9 days    Patricia [name] , [facility] 02/15/2020, 8:29 AM Please see Amion for pager number during day hours 7:00am-4:30pm

## 2020-02-16 ENCOUNTER — Other Ambulatory Visit: Payer: Self-pay | Admitting: Internal Medicine

## 2020-02-16 LAB — CBC WITH DIFFERENTIAL/PLATELET
Abs Immature Granulocytes: 0.33 10*3/uL — ABNORMAL HIGH (ref 0.00–0.07)
Basophils Absolute: 0.1 10*3/uL (ref 0.0–0.1)
Basophils Relative: 1 %
Eosinophils Absolute: 0.2 10*3/uL (ref 0.0–0.5)
Eosinophils Relative: 2 %
HCT: 27 % — ABNORMAL LOW (ref 36.0–46.0)
Hemoglobin: 8.3 g/dL — ABNORMAL LOW (ref 12.0–15.0)
Immature Granulocytes: 4 %
Lymphocytes Relative: 25 %
Lymphs Abs: 2.3 10*3/uL (ref 0.7–4.0)
MCH: 27.9 pg (ref 26.0–34.0)
MCHC: 30.7 g/dL (ref 30.0–36.0)
MCV: 90.9 fL (ref 80.0–100.0)
Monocytes Absolute: 1 10*3/uL (ref 0.1–1.0)
Monocytes Relative: 11 %
Neutro Abs: 5.3 10*3/uL (ref 1.7–7.7)
Neutrophils Relative %: 57 %
Platelets: 358 10*3/uL (ref 150–400)
RBC: 2.97 MIL/uL — ABNORMAL LOW (ref 3.87–5.11)
RDW: 17 % — ABNORMAL HIGH (ref 11.5–15.5)
WBC: 9.2 10*3/uL (ref 4.0–10.5)
nRBC: 2.2 % — ABNORMAL HIGH (ref 0.0–0.2)

## 2020-02-16 LAB — COMPREHENSIVE METABOLIC PANEL
ALT: 14 U/L (ref 0–44)
AST: 17 U/L (ref 15–41)
Albumin: 3 g/dL — ABNORMAL LOW (ref 3.5–5.0)
Alkaline Phosphatase: 46 U/L (ref 38–126)
Anion gap: 16 — ABNORMAL HIGH (ref 5–15)
BUN: 13 mg/dL (ref 6–20)
CO2: 22 mmol/L (ref 22–32)
Calcium: 10.4 mg/dL — ABNORMAL HIGH (ref 8.9–10.3)
Chloride: 98 mmol/L (ref 98–111)
Creatinine, Ser: 9.56 mg/dL — ABNORMAL HIGH (ref 0.44–1.00)
GFR, Estimated: 5 mL/min — ABNORMAL LOW (ref >60)
Glucose, Bld: 96 mg/dL (ref 70–99)
Potassium: 3.9 mmol/L (ref 3.5–5.1)
Sodium: 136 mmol/L (ref 135–145)
Total Bilirubin: 0.6 mg/dL (ref 0.3–1.2)
Total Protein: 7.4 g/dL (ref 6.5–8.1)

## 2020-02-16 LAB — C-REACTIVE PROTEIN: CRP: 3.1 mg/dL — ABNORMAL HIGH (ref <1.0)

## 2020-02-16 LAB — MAGNESIUM: Magnesium: 1.8 mg/dL (ref 1.7–2.4)

## 2020-02-16 MED ORDER — SACCHAROMYCES BOULARDII 250 MG PO CAPS: 250.0000 mg | ORAL_CAPSULE | Freq: Two times a day (BID) | ORAL | 0 refills | Status: DC

## 2020-02-16 MED ORDER — AMOXICILLIN-POT CLAVULANATE 500-125 MG PO TABS: 1.0000 | ORAL_TABLET | Freq: Every evening | ORAL | 0 refills | Status: DC

## 2020-02-16 MED ORDER — ZOLPIDEM TARTRATE 5 MG PO TABS: 5.0000 mg | ORAL_TABLET | Freq: Every evening | ORAL | 0 refills | Status: DC | PRN

## 2020-02-16 MED ORDER — AMOXICILLIN-POT CLAVULANATE 500-125 MG PO TABS
1.0000 | ORAL_TABLET | Freq: Every evening | ORAL | Status: DC
  Administered 2020-02-16: 500 mg via ORAL
  Filled 2020-02-16: qty 1

## 2020-02-16 MED ORDER — HEPARIN SODIUM (PORCINE) 1000 UNIT/ML IJ SOLN
INTRAMUSCULAR | Status: AC
  Filled 2020-02-16: qty 4

## 2020-02-16 MED FILL — AMOX-CLAV 500-125 MG TABLET: 500-125 | 4 days supply | Qty: 4 | Fill #0

## 2020-02-16 MED FILL — FLORASTOR 250 MG CAPSULE: 250 | 5 days supply | Qty: 10 | Fill #0

## 2020-02-16 MED FILL — ZOLPIDEM TARTRATE 5 MG TAB: 5 | 4 days supply | Qty: 4 | Fill #0

## 2020-02-16 NOTE — Discharge Summary (Signed)
[facility] Discharge Summary   Patient: Patricia [name] KZL:[id number]  PCP: Barrie Lyme  Date of admission: 02/05/2020   Date of discharge:  02/16/2020     Discharge Diagnoses:  Principal Problem:   Pericolonic abscess Active Problems:   Hypertension   Symptomatic anemia   GI bleed   Acute on chronic blood loss anemia   Anemia   ESRD on hemodialysis ([place])   Admitted From: home Disposition:  Home   Recommendations for Outpatient Follow-up:  1. PCP: please follow up with pcp in 1 week and Gastroenterology in 6 weeks  2. Follow up LABS/TEST: Colonoscopy in 6 weeks   [place] [facility]. Schedule an appointment as soon as possible for a visit in 2 week(s).   Specialty: Obstetrics and Gynecology Why: f/u for h/o labial abscess Contact information: [place] [address] [phone]       Doyle Askew, [place]. Schedule an appointment as soon as possible for a visit in 1 week(s).   Specialty: Physician Assistant Contact information: [address] [place] [address] [phone]        Mansouraty, Telford Nab., MD. Call.   Specialties: Gastroenterology, Internal Medicine Why: for colonoscopy after 6 weeks.  Contact information: [place] [address] [phone]              Discharge Instructions    Ambulatory referral to Neurology   Complete by: As directed    An appointment is requested in approximately: 8 weeks   Diet - low sodium heart healthy   Complete by: As directed    Increase activity slowly   Complete by: As directed       Diet recommendation: Renal diet  Activity: The patient is advised to gradually reintroduce usual activities, as tolerated  Discharge Condition: stable  Code Status: Full code   History of present illness: As per the H and P dictated on admission, "Patricia  Francis [name] is a 45 y.o. female with medical history significant for ESRD on hemodialysis MWF, hypertension, anxiety, and history of GI bleeding, now presenting to the emergency department for evaluation of abdominal pain and rectal bleeding.  Patient reports 1 day of pain in the lower abdomen, fatigue, exertional dyspnea, and rectal bleeding.  She describes having an urge to defecate and passing bright red blood and clots.  She has had several episodes of this bleeding.  The pain has been constant for the past day, was worse after she ate something, and so she has not eaten anything since yesterday.  She denies similar symptoms previously and notes that she had dark stool with her previous GI bleeds.    She had EGD in 2019 which demonstrated healing of a prior gastric ulcer, and no gross lesions in the esophagus, stomach, duodenum, or proximal jejunum.  Colonoscopy in 2020 revealed congested and erythematous mucosa in the sigmoid and transverse colon diverticulosis involving the sigmoid and descending colon, and polyps in the transverse colon.  Capsule endoscopy in May 2020 revealed nonbleeding small bowel AVMs beyond the reach of conventional push enteroscopy."  Hospital Course:  Summary of her active problems in the hospital is as following.   1. Pericolonic abscess between the rectum and sigmoid. Suspected diverticulitis Labial abscess CT on 2/1 shows fluid collection posterior to sigmoid. With leukocytosis on admission. IR was consulted, abscess not amenable to drain. Patient was started on  IV Zosyn. Repeat CT 2/9 with persistent wall thickeningof thesigmoid colonbut the degree of pericolonic inflammatory change is decreased in the area of fluid collectionand this fluid collectionhas also decreased from the prior exam Tolerating renal diet. Currently on antibiotics for 4 more days. Renally adjusted. Colonoscopy in 6 weeks. GYN consulted for labial abscess. Recommended conservative  management.  ESR >140 and CRP elevated.   2.  GI bleed Appreciate GI assistance, currently signed off. Outpatient follow-up recommended. Treated conservatively.  GI bleed currently resolved. No plan for endoscopic evaluation at this time. Evaluation via colonoscopy or flexible sigmoidoscopy in 6 weeks for her pericolonic abscess. Patient has seen [facility], [facility] in the past. Seen by [facility] during this admission. Follow-up with [facility] as scheduled.  3.  ESRD on MWF HD Initially was on PD.  Currently transition to HD due to poor clearance. Management of HD per nephrology. Flush PD catheter weekly to maintain patency. Electrolyte and volume management per nephrology via HD.  4.  Rapid eye fluttering Neurology was consulted. 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 MRI did not show any acute abnormality. B12 normal. EEG showed no evidence of active seizures. Outpatient work-up recommended with neurology.  No further work-up or treatment in the hospital.  5.  Chronic neuropathy On gabapentin. Continue.  6.  HTN Blood pressure stable. Continue current regimen.  7.  HLD Continue statin.  8.  Obesity. Placing the patient at high risk of poor outcome.  Monitor. Body mass index is 33.42 kg/m.   9.  Hypokalemia. Replaced.  Patient was ambulatory without any assistance. On the day of the discharge the patient's vitals were stable, and no other acute medical condition were reported by patient. The patient was felt safe to be discharge at Home with no therapy needed on discharge.  Consultants: General surgery Gastroenterology Neurology Nephrology  Procedures: HD  DISCHARGE MEDICATION: Allergies as of 02/16/2020      Reactions   Nsaids Other (See Comments)   Cannot take due to Kidney disease/Kidney function   Tolmetin Other (See Comments)   Cannot take due to Kidney Disease      Medication List    TAKE these medications   allopurinol 100 MG  tablet Commonly known as: ZYLOPRIM Take 100 mg by mouth at bedtime.   ALPRAZolam 1 MG tablet Commonly known as: XANAX Take 1 mg by mouth 2 (two) times daily as needed for anxiety.   amoxicillin-clavulanate 500-125 MG tablet Commonly known as: AUGMENTIN Take 1 tablet (500 mg total) by mouth every evening.   atorvastatin 40 MG tablet Commonly known as: LIPITOR Take 1 tablet (40 mg total) by mouth daily at 6 PM.   calcitRIOL 0.5 MCG capsule Commonly known as: ROCALTROL Take 1 capsule (0.5 mcg total) by mouth daily with breakfast. What changed: when to take this   ferric citrate 1 GM 210 MG(Fe) tablet Commonly known as: AURYXIA Take 3 tablets (630 mg total) by mouth 3 (three) times daily with meals.   gabapentin 300 MG capsule Commonly known as: NEURONTIN Take 300 mg by mouth 2 (two) times daily.   hydrALAZINE 50 MG tablet Commonly known as: APRESOLINE Take 50 mg by mouth 2 (two) times daily.   hydrOXYzine 25 MG tablet Commonly known as: ATARAX/VISTARIL Take 25 mg by mouth every 6 (six) hours as needed for itching.   multivitamin Tabs tablet Take 1 tablet by mouth at bedtime.   omeprazole 40 MG capsule Commonly known as: PRILOSEC Take 40  mg by mouth 2 (two) times daily.   promethazine 25 MG tablet Commonly known as: PHENERGAN Take 1 tablet (25 mg total) by mouth every 8 (eight) hours as needed for nausea or vomiting.   saccharomyces boulardii 250 MG capsule Commonly known as: FLORASTOR Take 1 capsule (250 mg total) by mouth 2 (two) times daily.   zolpidem 5 MG tablet Commonly known as: AMBIEN Take 1 tablet (5 mg total) by mouth at bedtime as needed for sleep.       Discharge Exam: Filed Weights   02/16/20 0700 02/16/20 1250 02/16/20 1540  Weight: 97.7 kg 91.6 kg 90.4 kg   Vitals:   02/16/20 1540 02/16/20 1635  BP: (!) 171/96 (!) 162/103  Pulse: 91 88  Resp: 18 18  Temp: 97.8 F (36.6 C) 98.4 F (36.9 C)  SpO2: 99% 100%   General: Appear in no  distress, no Rash; Oral Mucosa Clear, moist. no Abnormal Neck Mass Or lumps, Conjunctiva normal  Cardiovascular: S1 and S2 Present, no Murmur Respiratory: good respiratory effort, Bilateral Air entry present and CTA, no Crackles, no wheezes Abdomen: Bowel Sound present, Soft and no tenderness Extremities: no Pedal edema Neurology: alert and oriented to time, place, and person affect appropriate. no new focal deficit  The results of significant diagnostics from this hospitalization (including imaging, microbiology, ancillary and laboratory) are listed below for reference.    Significant Diagnostic Studies: MR BRAIN W WO CONTRAST  Result Date: 02/09/2020 CLINICAL DATA:  Neuro deficit, acute, stroke suspected. EXAM: MRI HEAD WITHOUT AND WITH CONTRAST TECHNIQUE: Multiplanar, multiecho pulse sequences of the brain and surrounding structures were obtained without and with intravenous contrast. CONTRAST:  45mL GADAVIST GADOBUTROL 1 MMOL/ML IV SOLN COMPARISON:  Head CT May 12, 2019. FINDINGS: Brain: No acute infarction, hemorrhage, hydrocephalus, extra-axial collection or mass lesion. The brain parenchyma has normal morphology and signal characteristics. No focus of abnormal contrast enhancement. Vascular: Normal flow voids. Skull and upper cervical spine: Diffuse decrease of the T1 signal within the calvarium and visualized upper cervical spine may represent red marrow reconversion in the setting of anemia. Sinuses/Orbits: Negative. IMPRESSION: 1. No acute intracranial abnormality. Unremarkable MRI of the brain. 2. Diffuse decrease of the T1 signal within the calvarium and visualized upper cervical spine may represent red marrow reconversion in the setting of anemia. Electronically Signed   By: Pedro Earls M.D.   On: 02/09/2020 08:38   CT ABDOMEN PELVIS W CONTRAST  Result Date: 02/14/2020 CLINICAL DATA:  Follow-up diverticulitis EXAM: CT ABDOMEN AND PELVIS WITH CONTRAST TECHNIQUE:  Multidetector CT imaging of the abdomen and pelvis was performed using the standard protocol following bolus administration of intravenous contrast. CONTRAST:  120mL OMNIPAQUE IOHEXOL 300 MG/ML  SOLN COMPARISON:  02/09/2020 FINDINGS: Lower chest: No acute abnormality. Hepatobiliary: Gallbladder is well distended with vicarious excretion of contrast. The liver is within normal limits. Pancreas: Unremarkable. No pancreatic ductal dilatation or surrounding inflammatory changes. Spleen: Normal in size without focal abnormality. Adrenals/Urinary Tract: Adrenal glands are within normal limits. Kidneys demonstrate small cysts on delayed images. No obstructive changes are seen. Normal excretion of contrast is noted. Bladder is decompressed. Peritoneal dialysis catheter is noted in the pelvis stable from the prior exam. Stomach/Bowel: Changes of diverticulosis are again seen. Persistent wall thickening sigmoid colon is noted. The degree of pericolonic inflammatory change is decreased in the area of fluid collection has also decreased in the interval from the prior exam. Persistent soft tissue density remains in this region between the  sigmoid and rectum. More proximal colon is within normal limits. The appendix is unremarkable. Small bowel and stomach are unremarkable. Vascular/Lymphatic: Aortic atherosclerosis. No enlarged abdominal or pelvic lymph nodes. Reproductive: Uterus is not well visualized and likely surgically removed. No adnexal mass is noted. Other: Small fat containing anterior abdominal wall hernias are noted and stable. Musculoskeletal: Degenerative changes of the sacroiliac joints are noted bilaterally. IMPRESSION: Continued improvement in the degree of sigmoid inflammatory change when compared with the prior exam. The fluid collection seen previously has decreased as well. Persistent soft tissue density is noted in this region. Continued follow-up can be performed as clinically indicated. The remainder of  the exam is stable from the prior study. Electronically Signed   By: Inez Catalina M.D.   On: 02/14/2020 08:09   CT ABDOMEN PELVIS W CONTRAST  Result Date: 02/09/2020 CLINICAL DATA:  Intra-abdominal abscess, abdominal pain for 1 week; history end-stage renal disease on peritoneal dialysis, hypertension EXAM: CT ABDOMEN AND PELVIS WITH CONTRAST TECHNIQUE: Multidetector CT imaging of the abdomen and pelvis was performed using the standard protocol following bolus administration of intravenous contrast. Sagittal and coronal MPR images reconstructed from axial data set. No oral contrast. CONTRAST:  1m OMNIPAQUE IOHEXOL 300 MG/ML  SOLN IV COMPARISON:  02/06/2020 FINDINGS: Lower chest: Minimal subsegmental atelectasis in the lower lobes. Hepatobiliary: Mildly increased density dependently in gallbladder likely vicarious excretion of contrast slightly increased from previous exam. Gallbladder and liver otherwise normal appearance Pancreas: Normal appearance Spleen: Normal appearance. Splenule adjacent to inferior spleen 2 cm diameter. Adrenals/Urinary Tract: Adrenal glands normal appearance. BILATERAL renal cortical thinning and small cysts. No additional mass, hydronephrosis, hydroureter, or ureteral calcification. Bladder unremarkable. Stomach/Bowel: Stomach decompressed. Appendix filled with contrast. No evidence of appendicitis. Bowel wall thickening of sigmoid colon with few scattered sigmoid diverticula seen, likely reflecting diverticulitis. Adjacent thickening of the rectal wall. Small fluid collection between the sigmoid colon and rectum 16 x 15 mm consistent with small pericolic diverticular abscess decreased from the 3.3 x 2.3 cm on the prior study. No new abscess collections. Vascular/Lymphatic: Atherosclerotic calcifications aorta and iliac arteries. Multiple pelvic phleboliths. No adenopathy. Few normal sized retroperitoneal lymph nodes noted. Reproductive: Uterus surgically absent.  Unremarkable ovaries.  Other: Peritoneal dialysis catheter in pelvis. No free fluid or free air. Umbilical hernia containing fat. Small supraumbilical ventral hernia containing fat. Musculoskeletal: Unremarkable IMPRESSION: Persistent wall thickening of the sigmoid colon and rectum with surrounding infiltrative changes consistent with diverticulitis. Decreased size of a small pericolic diverticular abscess collection, now 16 x 15 mm. Peritoneal dialysis catheter in pelvis. Umbilical and supraumbilical ventral hernias containing fat. Aortic Atherosclerosis (ICD10-I70.0). Electronically Signed   By: MLavonia DanaM.D.   On: 02/09/2020 14:04   CT Abdomen Pelvis W Contrast  Result Date: 02/06/2020 CLINICAL DATA:  Rectal bleeding and abdominal pain EXAM: CT ABDOMEN AND PELVIS WITH CONTRAST TECHNIQUE: Multidetector CT imaging of the abdomen and pelvis was performed using the standard protocol following bolus administration of intravenous contrast. CONTRAST:  1062mOMNIPAQUE IOHEXOL 300 MG/ML  SOLN COMPARISON:  04/04/2018 FINDINGS: Lower chest: No acute abnormality. Hepatobiliary: No focal liver abnormality is seen. No gallstones, gallbladder wall thickening, or biliary dilatation. Pancreas: Unremarkable. No pancreatic ductal dilatation or surrounding inflammatory changes. Spleen: Normal in size without focal abnormality. Adrenals/Urinary Tract: Adrenal glands are within normal limits bilaterally. Kidneys demonstrate a normal enhancement pattern with small cysts bilaterally. No renal calculi or obstructive changes are seen. The bladder is decompressed. Stomach/Bowel: Diverticular change of the colon is  noted. Along the posterior wall of the sigmoid colon interposed between the sigmoid and the rectum there is a 3.3 x 2.3 cm fluid collection which was not present on the prior exam and likely represents a small pericolonic abscess. No other inflammatory changes are seen. The appendix is prominent (13 mm) with some inspissated barium within  although no inflammatory changes are seen. Small bowel and stomach appear within normal limits. Vascular/Lymphatic: Aortic atherosclerosis. No enlarged abdominal or pelvic lymph nodes. Reproductive: Status post hysterectomy. No adnexal masses. Other: Peritoneal dialysis catheter is noted. No significant residual dialysate is seen. Small fat containing anterior abdominal wall hernias are noted stable from the prior study. No herniation of bowel loops is seen. Musculoskeletal: No acute or significant osseous findings. IMPRESSION: Changes of diverticulosis with a fluid collection posterior to the sigmoid and anterior to the rectum as described suspicious for small pericolonic abscess likely arising from the posterior wall of the sigmoid colon. Prominent appendix with inspissated barium although no inflammatory changes are seen. Stable chronic changes similar to that seen on previous exams. Electronically Signed   By: Inez Catalina M.D.   On: 02/06/2020 02:28   EEG adult  Result Date: 02/10/2020 Lora Havens, MD     02/10/2020  9:03 AM [name] MRN: [id number] Epilepsy Attending: Lora Havens Referring Physician/Provider: Anibal Henderson, NP Date: 02/09/2020 Duration: 30.04 mins Patient history: 45 year old female with complains of rapid eyelid fluttering with associated rapid right upward eye deviation which has progressively worsened over the past two months which is now impairing her function and vision. EEG to evaluate for seizure Level of alertness: Awake AEDs during EEG study: GBP Technical aspects: This EEG study was done with scalp electrodes positioned according to the 10-20 International system of electrode placement. Electrical activity was acquired at a sampling rate of _0  and reviewed with a high frequency filter of _1  and a low frequency filter of _2 . EEG data were recorded continuously and digitally stored. Description: The posterior dominant rhythm consists of  8-9 Hz activity of moderate voltage (25-35 uV) seen predominantly in posterior head regions, symmetric and reactive to eye opening and eye closing. Hyperventilation and photic stimulation were not performed.   IMPRESSION: This study is within normal limits. No seizures or epileptiform discharges were seen throughout the recording. Lora Havens    Microbiology: Recent Results (from the past 240 hour(s))  MRSA PCR Screening     Status: None   Collection Time: 02/06/20 11:05 PM   Specimen: Nasopharyngeal  Result Value Ref Range Status   MRSA by PCR NEGATIVE NEGATIVE Final    Comment:        The GeneXpert MRSA Assay (FDA approved for NASAL specimens only), is one component of a comprehensive MRSA colonization surveillance program. It is not intended to diagnose MRSA infection nor to guide or monitor treatment for MRSA infections. Performed at [facility], [place] [address], [place], [place] [address]      Labs: CBC: Recent Labs  Lab 02/12/20 0154 02/13/20 0252 02/14/20 0201 02/15/20 0158 02/16/20 0048  WBC 12.0* 12.3* 11.8* 9.5 9.2  NEUTROABS  --  7.3 6.9 5.1 5.3  HGB 8.5* 8.8* 8.2* 8.0* 8.3*  HCT 26.5* 27.8* 25.7* 26.3* 27.0*  MCV 88.6 89.4 89.2 91.0 90.9  PLT 396 405* 357 346 629   Basic Metabolic Panel: Recent Labs  Lab 02/12/20 1140 02/13/20 0252 02/14/20 0201 02/15/20 0158 02/16/20 0048  NA 137 135 133* 137 136  K 3.8 3.9 3.8 3.2* 3.9  CL 97* 95* 95* 96* 98  CO2 22 23 19* 24 22  GLUCOSE 92 86 83 87 96  BUN 18 12 21* 9 13  CREATININE 9.16* 6.84* 9.51* 6.82* 9.56*  CALCIUM 10.2 10.4* 10.4* 10.0 10.4*  MG  --  2.0 2.0 1.9 1.8  PHOS 8.1*  --   --   --   --    Liver Function Tests: Recent Labs  Lab 02/12/20 1140 02/13/20 0252 02/14/20 0201 02/15/20 0158 02/16/20 0048  AST  --  _0 ALT  --  _1 ALKPHOS  --  43 37* 39 46  BILITOT  --  0.8 0.8 0.5 0.6  PROT  --  8.1 7.6 7.1 7.4  ALBUMIN 2.8* 3.2* 3.1* 2.9* 3.0*    CBG: Recent Labs  Lab 02/13/20 0829  GLUCAP 90    Time spent: 35 minutes  Signed:  Berle Mull  [facility]  02/16/2020 5:35 PM

## 2020-02-16 NOTE — Progress Notes (Signed)
Subjective: CC: Patient reports that her abdominal pain has improved. Minimal LLQ and suprapubic abdominal pain this morning. She had beef yesterday for dinner and got nauseated but denies any emesis. She is passing flatus and had a BM yesterday.   Objective: Vital signs in last 24 hours: Temp:  [98.4 F (36.9 C)-98.8 F (37.1 C)] 98.8 F (37.1 C) (02/11 0700) Pulse Rate:  [92-96] 92 (02/11 0700) Resp:  [16-18] 17 (02/11 0700) BP: (145-153)/(90-94) 152/92 (02/11 0700) SpO2:  [97 %-100 %] 97 % (02/11 0700) Weight:  [97.7 kg] 97.7 kg (02/11 0700) Last BM Date: 02/13/20  Intake/Output from previous day: 02/10 0701 - 02/11 0700 In: 360 [P.O.:360] Out: -  Intake/Output this shift: No intake/output data recorded.  PE: Gen: Awake and alert, NAD Resp:normal rate and effort VWP:VXYIA, soft, essentially NT w/ very mild tendernessin thesuprapubicregion andleft lower quadrant that is improved from yesterday. [place] orrigidity. PD catheter with dressing overlying. +BS XK:PVVZSMOLM, RN present Judson Roch). There isless swelling on the right labia. The prior area of fluctuance and induration appears to be resolving. Overall improved. No drainage.  Lab Results:  Recent Labs    02/15/20 0158 02/16/20 0048  WBC 9.5 9.2  HGB 8.0* 8.3*  HCT 26.3* 27.0*  PLT 346 358   BMET Recent Labs    02/15/20 0158 02/16/20 0048  NA 137 136  K 3.2* 3.9  CL 96* 98  CO2 24 22  GLUCOSE 87 96  BUN 9 13  CREATININE 6.82* 9.56*  CALCIUM 10.0 10.4*   PT/INR No results for input(s): LABPROT, INR in the last 72 hours. CMP     Component Value Date/Time   NA 136 02/16/2020 0048   K 3.9 02/16/2020 0048   CL 98 02/16/2020 0048   CO2 22 02/16/2020 0048   GLUCOSE 96 02/16/2020 0048   BUN 13 02/16/2020 0048   CREATININE 9.56 (H) 02/16/2020 0048   CALCIUM 10.4 (H) 02/16/2020 0048   PROT 7.4 02/16/2020 0048   ALBUMIN 3.0 (L) 02/16/2020 0048   AST 17 02/16/2020 0048   ALT 14  02/16/2020 0048   ALKPHOS 46 02/16/2020 0048   BILITOT 0.6 02/16/2020 0048   GFRNONAA 5 (L) 02/16/2020 0048   GFRAA 4 (L) 05/14/2019 2254   Lipase     Component Value Date/Time   LIPASE 34 05/11/2019 0515       Studies/Results: No results found.  Anti-infectives: Anti-infectives (From admission, onward)   Start     Dose/Rate Route Frequency Ordered Stop   02/06/20 2000  piperacillin-tazobactam (ZOSYN) IVPB 3.375 g  Status:  Discontinued       Note to Pharmacy: Zosyn 3.375 g IV q12h in ESRD on HD   3.375 g 12.5 mL/hr over 240 Minutes Intravenous Every 12 hours 02/06/20 0352 02/06/20 0806   02/06/20 1200  piperacillin-tazobactam (ZOSYN) IVPB 2.25 g       Note to Pharmacy: Zosyn 3.375 g IV q12h in ESRD on HD   2.25 g 100 mL/hr over 30 Minutes Intravenous Every 8 hours 02/06/20 0806     02/06/20 0245  piperacillin-tazobactam (ZOSYN) IVPB 3.375 g        3.375 g 100 mL/hr over 30 Minutes Intravenous  Once 02/06/20 0238 02/06/20 0415       Assessment/Plan End-stage renal disease(PD >>HD MWF) -stitchfrom prior surgeryremoved today GI bleed -? Diverticular related. GI has seen. Per chart review, appt made for 03/13/20 at 150 pm with Dr Rush Landmark Hypertension Acute on chronic  anemia Right labial abscess-seen by GYN 2/7, no indication for I&D, continue abx and warm compresses. They have put their information in the chart for follow up.  - [facility] to [facility] -  Suspected diverticulitis with pericolonic abscess between the rectum and sigmoid - CT 2/1 w/Changes of diverticulosis with a fluid collection posterior to the sigmoid and anterior to the rectum as described suspicious for small pericolonic abscess likely arising from the posterior wall of the sigmoid colon. - Repeat CT 2/4 w/ persistent wall thickening of the sigmoid colon and rectum with surrounding infiltrative changes consistent with diverticulitis. Decreased size of a small pericolic diverticular abscess collection,  nowmeasuring16 x 15 mm.Appears toosmall to drain - Repeat CT 2/9 with persistent wall thickeningof thesigmoid colonbut the degree of pericolonic inflammatory change is decreased in the area of fluid collectionand this fluid collectionhas also decreased from the prior exam - WBC 13.8>> 14.8>> 13.3>> 13.2>>10.8>>11.8>>12.1>>12.0>>12.3>>11.8 >> 9.5 >> 9.2 - If patient is able to tolerate diet today with nausea or increased pain she is able to be discharged from our standpoint. She is afebrile, WBC has normalized, CT w/ improvement as noted above, she is essentially NT on exam and she is having bowel function. Would recommend colonoscopy in 6 weeks. It appears she is already scheduled to see GI as outpatient. Would recommend a total of 14 days of abx (on day 11 today). She is okay to switch to PO abx from our standpoint.   FEN: renal ID: Zosyn 2/1 >> day#11. Can switch to PO abx from our standpoint.  SYV:GCYO, heparin subq    LOS: 10 days    Jillyn Ledger , [facility] Surgery 02/16/2020, 8:06 AM Please see Amion for pager number during day hours 7:00am-4:30pm

## 2020-02-16 NOTE — Progress Notes (Signed)
[facility] Progress Note   Subjective:   Reports she is tired this AM. Denies SOB, CP, abdominal pain and nausea at present.   Objective Vitals:   02/15/20 0521 02/15/20 1655 02/15/20 2026 02/16/20 0700  BP: (!) 160/99 (!) 145/94 (!) 153/90 (!) 152/92  Pulse: (!) 105 95 96 92  Resp: $Remo'16 16 18 17  'ONftN$ Temp: 98.5 F (36.9 C) 98.6 F (37 C) 98.4 F (36.9 C) 98.8 F (37.1 C)  TempSrc: Oral Oral Oral Oral  SpO2: 100% 100% 99% 97%  Weight: 96.8 kg   97.7 kg  Height:       Physical Exam General: Well developed, alert and in NAD Heart: RRR, no murmurs, rubs or gallops Lungs: CTA bilaterally, respirations unlabored on RA Abdomen: Soft, non-distended, +BS, no TTP around PD cath Extremities: No edema b/l lower extremities Dialysis Access:  R [facility]  Additional Objective Labs: Basic Metabolic Panel: Recent Labs  Lab 02/12/20 1140 02/13/20 0252 02/14/20 0201 02/15/20 0158 02/16/20 0048  NA 137   < > 133* 137 136  K 3.8   < > 3.8 3.2* 3.9  CL 97*   < > 95* 96* 98  CO2 22   < > 19* 24 22  GLUCOSE 92   < > 83 87 96  BUN 18   < > 21* 9 13  CREATININE 9.16*   < > 9.51* 6.82* 9.56*  CALCIUM 10.2   < > 10.4* 10.0 10.4*  PHOS 8.1*  --   --   --   --    < > = values in this interval not displayed.   Liver Function Tests: Recent Labs  Lab 02/14/20 0201 02/15/20 0158 02/16/20 0048  AST $Re'16 16 17  'QpV$ ALT $R'12 12 14  'lL$ ALKPHOS 37* 39 46  BILITOT 0.8 0.5 0.6  PROT 7.6 7.1 7.4  ALBUMIN 3.1* 2.9* 3.0*   No results for input(s): LIPASE, AMYLASE in the last 168 hours. CBC: Recent Labs  Lab 02/12/20 0154 02/12/20 0154 02/13/20 0252 02/14/20 0201 02/15/20 0158 02/16/20 0048  WBC 12.0*  --  12.3* 11.8* 9.5 9.2  NEUTROABS  --    < > 7.3 6.9 5.1 5.3  HGB 8.5*  --  8.8* 8.2* 8.0* 8.3*  HCT 26.5*  --  27.8* 25.7* 26.3* 27.0*  MCV 88.6  --  89.4 89.2 91.0 90.9  PLT 396  --  405* 357 346 358   < > = values in this interval not displayed.   Blood Culture    Component  Value Date/Time   SDES BLOOD RIGHT HAND 08/23/2018 2120   SPECREQUEST  08/23/2018 2120    BOTTLES DRAWN AEROBIC ONLY Blood Culture adequate volume   CULT  08/23/2018 2120    NO GROWTH 5 DAYS Performed at [facility], [place] [address], [place], [place] [address]    REPTSTATUS 08/28/2018 FINAL 08/23/2018 2120   CBG: Recent Labs  Lab 02/13/20 0829  GLUCAP 90   Medications: . sodium chloride 250 mL (02/16/20 0427)   . amoxicillin-clavulanate  1 tablet Oral QPM  . Chlorhexidine Gluconate Cloth  6 each Topical Q0600  . darbepoetin (ARANESP) injection - DIALYSIS  200 mcg Intravenous Q Wed-HD  . feeding supplement  1 Container Oral TID BM  . gabapentin  300 mg Oral BID  . heparin injection (subcutaneous)  5,000 Units Subcutaneous Q8H  . hydrALAZINE  25 mg Oral BID  . influenza vac split quadrivalent PF  0.5 mL Intramuscular Tomorrow-1000  .  multivitamin  1 tablet Oral QHS  . pantoprazole  40 mg Oral Q0600  . saccharomyces boulardii  250 mg Oral BID  . sevelamer carbonate  2.4 g Oral TID WC    [facility], 4 hours, BFR 400/DFR 800, EDW 97.5kg, 2K/2Ca, UF profile 2, TDC mircera 291mcg q 2 weeks- last dose 02/05/20  Assessment/Plan:  1. Pericolonic abscess:CCS and GI following, on zosyn,surgical intervention planned.GIconsulted. CT2/05showed decrease in size of abscess.Noted per admit CT scan showed continue improvement in degree of sigmoid inflammatory change.  General surgery recommending 14 total days of abx, cleared for discharge once she tolerates PO 2. Acute lower GI bleed:Hgb low on admission, now 8.3.  secondary to #1 above. Received 1 unit PRBC. Recently received max dose of ESA.No IV iron secondary infection 3. ESRDprior PD, recently transitioned to HD due to feeling poorly/insufficient clearances with PD,agreeable to continue HD for now.On MWF schedule.Will need to flush PD cath weekly to maintain  patency.  No heparin with dialysis with history of GI bleed 4. Hypokalemia:.K 3.9.Requiring intermittent supplementation. Using 4K bath with dialysis today.  5. Hypertension/volume:BP running high. Hydralazine was discontinued, recently restarted at lower dose. Titrate PRN. No evidence of volume overload on exam. 6. Metabolic bone disease:Corrected calcium remains elevated. Not on VDRA, using low Ca bath with HD. Last phos 8.1. Continue renvela.  7. Rapid eye fluttering/deviation  MRI with &w/o contrast02/05prior to HD with no acute abnormalities. EEGnormal. Plan for additional work up as outpatientper neuro   Anice Paganini, PA-C 02/16/2020, 10:01 AM  [place] Pager: [phone]

## 2020-02-17 ENCOUNTER — Telehealth: Payer: Self-pay | Admitting: Physician Assistant

## 2020-02-17 NOTE — Telephone Encounter (Signed)
Transition of care contact from inpatient facility  Date of Discharge: 02/16/20 Date of Contact: 02/17/20 Method of contact: Phone  Attempted to contact patient to discuss transition of care from inpatient admission. Patient did not answer the phone. Message was left on the patient's voicemail with call back instructions.  Anice Paganini, PA-C 02/17/2020, 12:08 PM  [employer]

## 2020-02-19 DIAGNOSIS — N764 Abscess of vulva: Secondary | ICD-10-CM | POA: Insufficient documentation

## 2020-02-21 ENCOUNTER — Ambulatory Visit: Payer: Medicare Other | Admitting: Obstetrics & Gynecology

## 2020-03-11 ENCOUNTER — Emergency Department (HOSPITAL_COMMUNITY): Payer: Medicare Other

## 2020-03-11 ENCOUNTER — Other Ambulatory Visit: Payer: Self-pay

## 2020-03-11 ENCOUNTER — Inpatient Hospital Stay (HOSPITAL_COMMUNITY)
Admission: EM | Admit: 2020-03-11 | Discharge: 2020-03-19 | DRG: 252 | Disposition: A | Discharge status: Home Health | Payer: Medicare Other | Attending: Internal Medicine | Admitting: Internal Medicine

## 2020-03-11 ENCOUNTER — Encounter (HOSPITAL_COMMUNITY): Payer: Self-pay | Admitting: Family Medicine

## 2020-03-11 DIAGNOSIS — E8770 Fluid overload, unspecified: Secondary | ICD-10-CM | POA: Diagnosis present

## 2020-03-11 DIAGNOSIS — E876 Hypokalemia: Secondary | ICD-10-CM | POA: Diagnosis not present

## 2020-03-11 DIAGNOSIS — I120 Hypertensive chronic kidney disease with stage 5 chronic kidney disease or end stage renal disease: Secondary | ICD-10-CM | POA: Diagnosis present

## 2020-03-11 DIAGNOSIS — D649 Anemia, unspecified: Secondary | ICD-10-CM | POA: Diagnosis not present

## 2020-03-11 DIAGNOSIS — Z90710 Acquired absence of both cervix and uterus: Secondary | ICD-10-CM

## 2020-03-11 DIAGNOSIS — H518 Other specified disorders of binocular movement: Secondary | ICD-10-CM | POA: Diagnosis present

## 2020-03-11 DIAGNOSIS — F419 Anxiety disorder, unspecified: Secondary | ICD-10-CM | POA: Diagnosis not present

## 2020-03-11 DIAGNOSIS — E785 Hyperlipidemia, unspecified: Secondary | ICD-10-CM | POA: Diagnosis present

## 2020-03-11 DIAGNOSIS — G253 Myoclonus: Secondary | ICD-10-CM | POA: Diagnosis not present

## 2020-03-11 DIAGNOSIS — Z79899 Other long term (current) drug therapy: Secondary | ICD-10-CM | POA: Diagnosis not present

## 2020-03-11 DIAGNOSIS — Z6836 Body mass index (BMI) 36.0-36.9, adult: Secondary | ICD-10-CM

## 2020-03-11 DIAGNOSIS — N185 Chronic kidney disease, stage 5: Secondary | ICD-10-CM | POA: Diagnosis not present

## 2020-03-11 DIAGNOSIS — E7800 Pure hypercholesterolemia, unspecified: Secondary | ICD-10-CM | POA: Diagnosis not present

## 2020-03-11 DIAGNOSIS — M109 Gout, unspecified: Secondary | ICD-10-CM | POA: Diagnosis present

## 2020-03-11 DIAGNOSIS — Z792 Long term (current) use of antibiotics: Secondary | ICD-10-CM

## 2020-03-11 DIAGNOSIS — I161 Hypertensive emergency: Secondary | ICD-10-CM | POA: Diagnosis not present

## 2020-03-11 DIAGNOSIS — R079 Chest pain, unspecified: Secondary | ICD-10-CM | POA: Diagnosis not present

## 2020-03-11 DIAGNOSIS — E669 Obesity, unspecified: Secondary | ICD-10-CM | POA: Diagnosis present

## 2020-03-11 DIAGNOSIS — Z888 Allergy status to other drugs, medicaments and biological substances status: Secondary | ICD-10-CM | POA: Diagnosis not present

## 2020-03-11 DIAGNOSIS — Z8249 Family history of ischemic heart disease and other diseases of the circulatory system: Secondary | ICD-10-CM

## 2020-03-11 DIAGNOSIS — Z886 Allergy status to analgesic agent status: Secondary | ICD-10-CM | POA: Diagnosis not present

## 2020-03-11 DIAGNOSIS — I169 Hypertensive crisis, unspecified: Secondary | ICD-10-CM

## 2020-03-11 DIAGNOSIS — E8889 Other specified metabolic disorders: Secondary | ICD-10-CM | POA: Diagnosis not present

## 2020-03-11 DIAGNOSIS — K219 Gastro-esophageal reflux disease without esophagitis: Secondary | ICD-10-CM | POA: Diagnosis present

## 2020-03-11 DIAGNOSIS — Z992 Dependence on renal dialysis: Secondary | ICD-10-CM | POA: Diagnosis not present

## 2020-03-11 DIAGNOSIS — Z20822 Contact with and (suspected) exposure to covid-19: Secondary | ICD-10-CM | POA: Diagnosis not present

## 2020-03-11 DIAGNOSIS — N186 End stage renal disease: Secondary | ICD-10-CM

## 2020-03-11 DIAGNOSIS — I160 Hypertensive urgency: Secondary | ICD-10-CM

## 2020-03-11 DIAGNOSIS — Z8673 Personal history of transient ischemic attack (TIA), and cerebral infarction without residual deficits: Secondary | ICD-10-CM | POA: Diagnosis not present

## 2020-03-11 DIAGNOSIS — E440 Moderate protein-calorie malnutrition: Secondary | ICD-10-CM | POA: Diagnosis not present

## 2020-03-11 DIAGNOSIS — Z9114 Patient's other noncompliance with medication regimen: Secondary | ICD-10-CM

## 2020-03-11 DIAGNOSIS — D631 Anemia in chronic kidney disease: Secondary | ICD-10-CM | POA: Diagnosis present

## 2020-03-11 LAB — BASIC METABOLIC PANEL
Anion gap: 18 — ABNORMAL HIGH (ref 5–15)
BUN: 32 mg/dL — ABNORMAL HIGH (ref 6–20)
CO2: 22 mmol/L (ref 22–32)
Calcium: 10.4 mg/dL — ABNORMAL HIGH (ref 8.9–10.3)
Chloride: 96 mmol/L — ABNORMAL LOW (ref 98–111)
Creatinine, Ser: 11.8 mg/dL — ABNORMAL HIGH (ref 0.44–1.00)
GFR, Estimated: 4 mL/min — ABNORMAL LOW (ref >60)
Glucose, Bld: 107 mg/dL — ABNORMAL HIGH (ref 70–99)
Potassium: 3.8 mmol/L (ref 3.5–5.1)
Sodium: 136 mmol/L (ref 135–145)

## 2020-03-11 LAB — CBC
HCT: 28.5 % — ABNORMAL LOW (ref 36.0–46.0)
Hemoglobin: 8.9 g/dL — ABNORMAL LOW (ref 12.0–15.0)
MCH: 28.1 pg (ref 26.0–34.0)
MCHC: 31.2 g/dL (ref 30.0–36.0)
MCV: 89.9 fL (ref 80.0–100.0)
Platelets: 303 10*3/uL (ref 150–400)
RBC: 3.17 MIL/uL — ABNORMAL LOW (ref 3.87–5.11)
RDW: 16.5 % — ABNORMAL HIGH (ref 11.5–15.5)
WBC: 7.6 10*3/uL (ref 4.0–10.5)
nRBC: 0 % (ref 0.0–0.2)

## 2020-03-11 LAB — I-STAT BETA HCG BLOOD, ED (MC, WL, AP ONLY): I-stat hCG, quantitative: 8.8 m[IU]/mL — ABNORMAL HIGH (ref <5)

## 2020-03-11 LAB — TROPONIN I (HIGH SENSITIVITY)
Troponin I (High Sensitivity): 24 ng/L — ABNORMAL HIGH (ref <18)
Troponin I (High Sensitivity): 21 ng/L — ABNORMAL HIGH (ref <18)

## 2020-03-11 MED ORDER — CLEVIDIPINE BUTYRATE 0.5 MG/ML IV EMUL: 0.0000 mg/h | INTRAVENOUS | Status: DC

## 2020-03-11 MED ORDER — NITROGLYCERIN IN D5W 200-5 MCG/ML-% IV SOLN
0.0000 ug/min | INTRAVENOUS | Status: DC
  Administered 2020-03-11: 5 ug/min via INTRAVENOUS
  Administered 2020-03-12 (×2): 200 ug/min via INTRAVENOUS
  Administered 2020-03-13: 30 ug/min via INTRAVENOUS
  Administered 2020-03-13 (×2): 200 ug/min via INTRAVENOUS
  Filled 2020-03-11 (×7): qty 250

## 2020-03-11 MED ORDER — SODIUM CHLORIDE 0.9 % IV SOLN: 250.0000 mL | INTRAVENOUS | Status: DC | PRN

## 2020-03-11 MED ORDER — ZOLPIDEM TARTRATE 5 MG PO TABS: 5.0000 mg | ORAL_TABLET | Freq: Every evening | ORAL | Status: DC | PRN

## 2020-03-11 MED ORDER — FENTANYL CITRATE (PF) 100 MCG/2ML IJ SOLN
25.0000 ug | INTRAMUSCULAR | Status: DC | PRN
  Administered 2020-03-11 – 2020-03-12 (×2): 50 ug via INTRAVENOUS
  Administered 2020-03-12 (×2): 25 ug via INTRAVENOUS
  Administered 2020-03-12: 50 ug via INTRAVENOUS
  Administered 2020-03-13 (×2): 25 ug via INTRAVENOUS
  Filled 2020-03-11 (×8): qty 2

## 2020-03-11 MED ORDER — SENNOSIDES-DOCUSATE SODIUM 8.6-50 MG PO TABS: 1.0000 | ORAL_TABLET | Freq: Every evening | ORAL | Status: DC | PRN

## 2020-03-11 MED ORDER — ALPRAZOLAM 0.5 MG PO TABS
0.5000 mg | ORAL_TABLET | Freq: Two times a day (BID) | ORAL | Status: DC | PRN
  Administered 2020-03-13 – 2020-03-18 (×7): 0.5 mg via ORAL
  Filled 2020-03-11 (×7): qty 1

## 2020-03-11 MED ORDER — ACETAMINOPHEN 650 MG RE SUPP: 650.0000 mg | Freq: Four times a day (QID) | RECTAL | Status: DC | PRN

## 2020-03-11 MED ORDER — HYDRALAZINE HCL 50 MG PO TABS
50.0000 mg | ORAL_TABLET | Freq: Three times a day (TID) | ORAL | Status: DC
Start: 2020-03-11 — End: 2020-03-14
  Administered 2020-03-11 – 2020-03-14 (×8): 50 mg via ORAL
  Filled 2020-03-11 (×5): qty 1
  Filled 2020-03-11: qty 2
  Filled 2020-03-11 (×2): qty 1

## 2020-03-11 MED ORDER — ZOLPIDEM TARTRATE 5 MG PO TABS
5.0000 mg | ORAL_TABLET | Freq: Every evening | ORAL | Status: DC | PRN
  Administered 2020-03-11 – 2020-03-18 (×7): 5 mg via ORAL
  Filled 2020-03-11 (×7): qty 1

## 2020-03-11 MED ORDER — ONDANSETRON HCL 4 MG/2ML IJ SOLN
4.0000 mg | Freq: Four times a day (QID) | INTRAMUSCULAR | Status: DC | PRN
  Administered 2020-03-12 – 2020-03-16 (×7): 4 mg via INTRAVENOUS
  Filled 2020-03-11 (×6): qty 2

## 2020-03-11 MED ORDER — ALLOPURINOL 100 MG PO TABS
100.0000 mg | ORAL_TABLET | Freq: Every day | ORAL | Status: DC
Start: 2020-03-11 — End: 2020-03-19
  Administered 2020-03-11 – 2020-03-18 (×8): 100 mg via ORAL
  Filled 2020-03-11 (×9): qty 1

## 2020-03-11 MED ORDER — ASPIRIN 81 MG PO CHEW
324.0000 mg | CHEWABLE_TABLET | Freq: Once | ORAL | Status: AC
  Administered 2020-03-11: 324 mg via ORAL
  Filled 2020-03-11: qty 4

## 2020-03-11 MED ORDER — GABAPENTIN 300 MG PO CAPS: 300.0000 mg | ORAL_CAPSULE | Freq: Every day | ORAL | Status: DC

## 2020-03-11 MED ORDER — PANTOPRAZOLE SODIUM 40 MG PO TBEC
40.0000 mg | DELAYED_RELEASE_TABLET | Freq: Every day | ORAL | Status: DC
  Administered 2020-03-11 – 2020-03-13 (×3): 40 mg via ORAL
  Filled 2020-03-11 (×3): qty 1

## 2020-03-11 MED ORDER — LABETALOL HCL 5 MG/ML IV SOLN
20.0000 mg | Freq: Once | INTRAVENOUS | Status: AC
  Administered 2020-03-11: 20 mg via INTRAVENOUS
  Filled 2020-03-11: qty 4

## 2020-03-11 MED ORDER — LABETALOL HCL 5 MG/ML IV SOLN
10.0000 mg | INTRAVENOUS | Status: DC | PRN
  Administered 2020-03-12 – 2020-03-15 (×12): 10 mg via INTRAVENOUS
  Filled 2020-03-11 (×11): qty 4

## 2020-03-11 MED ORDER — ACETAMINOPHEN 325 MG PO TABS
650.0000 mg | ORAL_TABLET | Freq: Once | ORAL | Status: AC
  Administered 2020-03-11: 650 mg via ORAL
  Filled 2020-03-11: qty 2

## 2020-03-11 MED ORDER — ATORVASTATIN CALCIUM 40 MG PO TABS
40.0000 mg | ORAL_TABLET | Freq: Every day | ORAL | Status: DC
  Administered 2020-03-12 – 2020-03-18 (×7): 40 mg via ORAL
  Filled 2020-03-11 (×7): qty 1

## 2020-03-11 MED ORDER — GABAPENTIN 100 MG PO CAPS
100.0000 mg | ORAL_CAPSULE | Freq: Every day | ORAL | Status: DC
  Administered 2020-03-11 – 2020-03-12 (×2): 100 mg via ORAL
  Filled 2020-03-11 (×2): qty 1

## 2020-03-11 MED ORDER — ALPRAZOLAM 0.25 MG PO TABS: 1.0000 mg | ORAL_TABLET | Freq: Two times a day (BID) | ORAL | Status: DC | PRN

## 2020-03-11 MED ORDER — HYDROXYZINE HCL 25 MG PO TABS
25.0000 mg | ORAL_TABLET | Freq: Four times a day (QID) | ORAL | Status: DC | PRN
  Administered 2020-03-15 – 2020-03-19 (×8): 25 mg via ORAL
  Filled 2020-03-11 (×8): qty 1

## 2020-03-11 MED ORDER — HEPARIN SODIUM (PORCINE) 5000 UNIT/ML IJ SOLN
5000.0000 [IU] | Freq: Three times a day (TID) | INTRAMUSCULAR | Status: DC
  Administered 2020-03-11 – 2020-03-18 (×19): 5000 [IU] via SUBCUTANEOUS
  Filled 2020-03-11 (×22): qty 1

## 2020-03-11 MED ORDER — LANTHANUM CARBONATE 500 MG PO CHEW
1000.0000 mg | CHEWABLE_TABLET | Freq: Three times a day (TID) | ORAL | Status: DC
  Administered 2020-03-12 – 2020-03-13 (×3): 1000 mg via ORAL
  Filled 2020-03-11 (×6): qty 2

## 2020-03-11 MED ORDER — ACETAMINOPHEN 325 MG PO TABS
650.0000 mg | ORAL_TABLET | Freq: Four times a day (QID) | ORAL | Status: DC | PRN
  Administered 2020-03-13 – 2020-03-15 (×5): 650 mg via ORAL
  Filled 2020-03-11 (×5): qty 2

## 2020-03-11 MED ORDER — ONDANSETRON HCL 4 MG PO TABS: 4.0000 mg | ORAL_TABLET | Freq: Four times a day (QID) | ORAL | Status: DC | PRN

## 2020-03-11 MED ORDER — HYDROXYZINE HCL 25 MG PO TABS: 25.0000 mg | ORAL_TABLET | Freq: Four times a day (QID) | ORAL | Status: DC | PRN

## 2020-03-11 NOTE — ED Notes (Signed)
Patient transported to CT 

## 2020-03-11 NOTE — ED Notes (Signed)
Pt's eyes keep rolling back as she is talking. Pt states she is unaware of this.

## 2020-03-11 NOTE — ED Triage Notes (Signed)
Pt reports 3-4 days of central/L sided chest pain, dizziness on standing, all over shaking, and intermittent difficulty talking. Does dialysis at home, completed tx last night.

## 2020-03-11 NOTE — ED Notes (Signed)
Patients pressure 225/99 after $RemoveBefo'20mg'[id number]$  of labetalol. Provider made aware.

## 2020-03-11 NOTE — H&P (Signed)
History and Physical    Patricia [name] GPM:[id number] DOB: January 30, 1975 DOA: 03/11/2020  PCP: Rick Duff, PA-C   Patient coming from: Home   Chief Complaint: Headache, chest pain, increased shaking   HPI: Patricia [name] is a 45 y.o. female with medical history significant for ESRD on peritoneal dialysis, hypertension, anxiety, chronic anemia, and myoclonia, now presenting to the emergency department for evaluation of headache, chest pain, and increased jerking movements.  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  Symptoms continued throughout the day and she eventually came in for evaluation.  Chest pain has been constant, nonradiating, and not associated with shortness of breath, diaphoresis, or nausea.  Headache has also been constant and without neck stiffness or focal numbness or weakness.  She has had rapid eye fluttering and myoclonus for at least several months, was evaluated by neurology during her recent admission, had unremarkable MRI brain, EEG within normal limits, normal B12, and was advised to follow-up with outpatient neurology.  She had not taken hydralazine yet today, noting that she usually only takes her medications at night.  ED Course: Upon arrival to the ED, patient is found to be afebrile, saturating well on room air, and with blood pressure as high as 225/99.  EKG features sinus rhythm with abnormal anterior Q waves.  Head CT is unremarkable.  No acute findings on chest x-ray.  Chemistry panel features normal potassium, normal bicarbonate, BUN 32, creatinine 11.8, and calcium 10.4.  CBC features a stable normocytic anemia.  High-sensitivity troponin was slightly elevated and decreasing.  Nephrology was consulted by the ED physician and the patient was given aspirin, acetaminophen, labetalol, and  started on nitroglycerin infusion.  Review of Systems:  All other systems reviewed and apart from HPI, are negative.  Past Medical History:  Diagnosis Date  . Acid reflux   . Anemia 04/2019   REQUIRING BLOOD TRANSFUSIONS  . Anxiety   . Arthritis   . Dyspnea   . Dysrhythmia    tachycardia  . ESRD (end stage renal disease) (HCC)    [facility]  . GI bleeding 12/2017  . Gout   . Headache(784.0)    "related to chemo; sometimes weekly" (09/12/2013)  . High cholesterol   . History of blood transfusion "a couple"   "related to low counts"  . Hypertension   . Iron deficiency anemia    "get epogen shots q month" (02/20/2014)  . 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 Peritoneal dialysis status (HCC)   . Stroke [facility]) 08/15/2017    Past Surgical History:  Procedure Laterality Date  . ABDOMINAL HYSTERECTOMY  2010   "laparoscopic"  . ANKLE FRACTURE SURGERY Right 1994  . AV FISTULA PLACEMENT Left 04/14/2016   Procedure: ARTERIOVENOUS (AV) FISTULA CREATION;  Surgeon: Chuck Hint, MD;  Location: [facility] OR;  Service: Vascular;  Laterality: Left;  . AV FISTULA PLACEMENT Left 08/09/2017   Procedure: INSERTION OF ARTERIOVENOUS (AV) GORE-TEX GRAFT LEFT ARM;  Surgeon: Sherren Kerns, MD;  Location: [facility] OR;  Service: Vascular;  Laterality: Left;  . AVGG REMOVAL Left 08/11/2017   Procedure: REMOVAL OF ARTERIOVENOUS GORETEX GRAFT (AVGG);  Surgeon: Nada Libman, MD;  Location: [facility] OR;  Service: Vascular;  Laterality: Left;  . BIOPSY  09/03/2017  Procedure: BIOPSY;  Surgeon: Otis Brace, MD;  Location: [place];  Service: Gastroenterology;;  . BIOPSY  12/24/2017   Procedure: BIOPSY;  Surgeon: Irving Copas., MD;  Location: [place];  Service: Gastroenterology;;  . CARDIAC CATHETERIZATION  2000's  . COLONOSCOPY WITH PROPOFOL N/A 09/04/2017   Procedure: COLONOSCOPY WITH PROPOFOL;  Surgeon: Ronnette Juniper, MD;   Location: [place];  Service: Gastroenterology;  Laterality: N/A;  . ENTEROSCOPY N/A 12/24/2017   Procedure: ENTEROSCOPY;  Surgeon: Rush Landmark Telford Nab., MD;  Location: [place];  Service: Gastroenterology;  Laterality: N/A;  . ESOPHAGOGASTRODUODENOSCOPY    . ESOPHAGOGASTRODUODENOSCOPY (EGD) WITH PROPOFOL Left 06/04/2017   Procedure: ESOPHAGOGASTRODUODENOSCOPY (EGD) WITH PROPOFOL;  Surgeon: Arta Silence, MD;  Location: [place];  Service: Endoscopy;  Laterality: Left;  . ESOPHAGOGASTRODUODENOSCOPY (EGD) WITH PROPOFOL N/A 09/03/2017   Procedure: ESOPHAGOGASTRODUODENOSCOPY (EGD) WITH PROPOFOL;  Surgeon: Otis Brace, MD;  Location: [place];  Service: Gastroenterology;  Laterality: N/A;  . FISTULA SUPERFICIALIZATION Left 04/02/2017   Procedure: FISTULA SUPERFICIALIZATION LEFT ARM;  Surgeon: Serafina Mitchell, MD;  Location: [place];  Service: Vascular;  Laterality: Left;  . FISTULOGRAM Left 04/07/2016   Procedure: FISTULOGRAM;  Surgeon: Waynetta Sandy, MD;  Location: [place];  Service: Vascular;  Laterality: Left;  . FRACTURE SURGERY     right ankle  . GIVENS CAPSULE STUDY N/A 10/29/2017   Procedure: GIVENS CAPSULE STUDY;  Surgeon: Wilford Corner, MD;  Location: [place];  Service: Endoscopy;  Laterality: N/A;  . GIVENS CAPSULE STUDY N/A 12/24/2017   Procedure: GIVENS CAPSULE STUDY;  Surgeon: Irving Copas., MD;  Location: [place];  Service: Gastroenterology;  Laterality: N/A;  . GIVENS CAPSULE STUDY N/A 05/22/2018   Procedure: GIVENS CAPSULE STUDY;  Surgeon: Irving Copas., MD;  Location: [place];  Service: Gastroenterology;  Laterality: N/A;  . INSERTION OF DIALYSIS CATHETER Right 04/07/2016   Procedure: INSERTION OF DIALYSIS CATHETER;  Surgeon: Waynetta Sandy, MD;  Location: [place];  Service: Vascular;  Laterality: Right;  . INSERTION OF DIALYSIS CATHETER Right 04/04/2017   Procedure: INSERTION OF DIALYSIS CATHETER;  Surgeon:  Waynetta Sandy, MD;  Location: [place];  Service: Vascular;  Laterality: Right;  . LEFT HEART CATH AND CORONARY ANGIOGRAPHY N/A 05/12/2019   Procedure: LEFT HEART CATH AND CORONARY ANGIOGRAPHY;  Surgeon: Dixie Dials, MD;  Location: [place] CV LAB;  Service: Cardiovascular;  Laterality: N/A;  . LIGATION OF ARTERIOVENOUS  FISTULA Left 04/14/2016   Procedure: LIGATION OF ARTERIOVENOUS  FISTULA;  Surgeon: Angelia Mould, MD;  Location: [place];  Service: Vascular;  Laterality: Left;  . PATCH ANGIOPLASTY Left 06/19/2016   Procedure: PATCH ANGIOPLASTY LEFT ARTERIOVENOUS FISTULA;  Surgeon: Angelia Mould, MD;  Location: [place];  Service: Vascular;  Laterality: Left;  . POLYPECTOMY  09/04/2017   Procedure: POLYPECTOMY;  Surgeon: Ronnette Juniper, MD;  Location: [facility] ENDOSCOPY;  Service: Gastroenterology;;  . REVISON OF ARTERIOVENOUS FISTULA Left 06/19/2016   Procedure: REVISION SUPERFICIALIZATION OF BRACHIOCEPHALIC ARTERIOVENOUS FISTULA;  Surgeon: Angelia Mould, MD;  Location: [place];  Service: Vascular;  Laterality: Left;    Social History:   reports that she has never smoked. She has never used smokeless tobacco. She reports that she does not drink alcohol and does not use drugs.  Allergies  Allergen Reactions  . Nsaids Other (See Comments)    Cannot take due to Kidney disease/Kidney function   . Tolmetin Other (See Comments)    Cannot take due to Kidney Disease    Family  History  Problem Relation Age of Onset  . Hypertension Mother   . Thyroid disease Mother   . Coronary artery disease Father   . Hypertension Father   . Diabetes Father   . Colon cancer Maternal Aunt   . Esophageal cancer Maternal Uncle   . Inflammatory bowel disease Neg Hx   . Liver disease Neg Hx   . Pancreatic cancer Neg Hx   . Rectal cancer Neg Hx   . Stomach cancer Neg Hx      Prior to Admission medications   Medication Sig Start Date End Date Taking? Authorizing Provider  allopurinol  (ZYLOPRIM) 100 MG tablet Take 100 mg by mouth at bedtime.     [provider]  ALPRAZolam Duanne Moron) 1 MG tablet Take 1 mg by mouth 2 (two) times daily as needed for anxiety.  03/19/17   [provider]  amoxicillin-clavulanate (AUGMENTIN) 500-125 MG tablet Take 1 tablet (500 mg total) by mouth every evening. 02/16/20   Lavina Hamman, MD  atorvastatin (LIPITOR) 40 MG tablet Take 1 tablet (40 mg total) by mouth daily at 6 PM. 05/13/19   Dixie Dials, MD  calcitRIOL (ROCALTROL) 0.5 MCG capsule Take 1 capsule (0.5 mcg total) by mouth daily with breakfast. Patient taking differently: Take 0.5 mcg by mouth 2 (two) times daily. 06/04/17   Bonnielee Haff, MD  ferric citrate (AURYXIA) 1 GM 210 MG(Fe) tablet Take 3 tablets (630 mg total) by mouth 3 (three) times daily with meals. 04/28/19   Elodia Florence., MD  gabapentin (NEURONTIN) 300 MG capsule Take 300 mg by mouth 2 (two) times daily. 02/01/20   [provider]  hydrALAZINE (APRESOLINE) 50 MG tablet Take 50 mg by mouth 2 (two) times daily. 10/06/19   [provider]  hydrOXYzine (ATARAX/VISTARIL) 25 MG tablet Take 25 mg by mouth every 6 (six) hours as needed for itching. 08/16/19   [provider]  multivitamin (RENA-VIT) TABS tablet Take 1 tablet by mouth at bedtime.     [provider]  omeprazole (PRILOSEC) 40 MG capsule Take 40 mg by mouth 2 (two) times daily.    [provider]  promethazine (PHENERGAN) 25 MG tablet Take 1 tablet (25 mg total) by mouth every 8 (eight) hours as needed for nausea or vomiting. 04/07/18   Shirley, [place], DO  saccharomyces boulardii (FLORASTOR) 250 MG capsule Take 1 capsule (250 mg total) by mouth 2 (two) times daily. 02/16/20   Lavina Hamman, MD  zolpidem (AMBIEN) 5 MG tablet Take 1 tablet (5 mg total) by mouth at bedtime as needed for sleep. 02/16/20   Lavina Hamman, MD    Physical Exam: Vitals:   03/11/20 1815 03/11/20 1930 03/11/20 1945 03/11/20 2105   BP: (!) 210/82 (!) 212/97 (!) 206/96 (!) 164/88  Pulse: 62 62 66 73  Resp: (!) $RemoveB'23 20 16 19  'rnIUOPkw$ Temp:      TempSrc:      SpO2: 100% 97% 97% 97%    Constitutional: NAD, calm  Eyes: PERTLA, lids and conjunctivae normal ENMT: Mucous membranes are moist. Posterior pharynx clear of any exudate or lesions.   Neck: normal, supple, no masses, no thyromegaly Respiratory: no wheezing, no crackles. No accessory muscle use.  Cardiovascular: S1 & S2 heard, regular rate and rhythm. No extremity edema.   Abdomen: No distension, no tenderness, soft. Bowel sounds active.  Musculoskeletal: no clubbing / cyanosis. No joint deformity upper and lower extremities.   Skin: no significant rashes, lesions,  ulcers. Warm, dry, well-perfused. Neurologic: Rapid periocular muscle twitching with eye deviation. Myoclonia. Sensation intact. Moving all extremities.  Psychiatric: Alert and oriented to person, place, and situation. Calm and cooperative.    Labs and Imaging on Admission: I have personally reviewed following labs and imaging studies  CBC: Recent Labs  Lab 03/11/20 1430  WBC 7.6  HGB 8.9*  HCT 28.5*  MCV 89.9  PLT 403   Basic Metabolic Panel: Recent Labs  Lab 03/11/20 1430  NA 136  K 3.8  CL 96*  CO2 22  GLUCOSE 107*  BUN 32*  CREATININE 11.80*  CALCIUM 10.4*   GFR: CrCl cannot be calculated (Unknown ideal weight.). Liver Function Tests: No results for input(s): AST, ALT, ALKPHOS, BILITOT, PROT, ALBUMIN in the last 168 hours. No results for input(s): LIPASE, AMYLASE in the last 168 hours. No results for input(s): AMMONIA in the last 168 hours. Coagulation Profile: No results for input(s): INR, PROTIME in the last 168 hours. Cardiac Enzymes: No results for input(s): CKTOTAL, CKMB, CKMBINDEX, TROPONINI in the last 168 hours. BNP (last 3 results) No results for input(s): PROBNP in the last 8760 hours. HbA1C: No results for input(s): HGBA1C in the last 72 hours. CBG: No results for  input(s): GLUCAP in the last 168 hours. Lipid Profile: No results for input(s): CHOL, HDL, LDLCALC, TRIG, CHOLHDL, LDLDIRECT in the last 72 hours. Thyroid Function Tests: No results for input(s): TSH, T4TOTAL, FREET4, T3FREE, THYROIDAB in the last 72 hours. Anemia Panel: No results for input(s): VITAMINB12, FOLATE, FERRITIN, TIBC, IRON, RETICCTPCT in the last 72 hours. Urine analysis:    Component Value Date/Time   COLORURINE COLORLESS (A) 04/03/2018 2319   APPEARANCEUR CLEAR 04/03/2018 2319   LABSPEC 1.006 04/03/2018 2319   PHURINE 8.0 04/03/2018 2319   GLUCOSEU >=500 (A) 04/03/2018 2319   HGBUR NEGATIVE 04/03/2018 2319   BILIRUBINUR NEGATIVE 04/03/2018 2319   KETONESUR NEGATIVE 04/03/2018 2319   PROTEINUR 100 (A) 04/03/2018 2319   UROBILINOGEN 0.2 09/19/2014 1620   NITRITE NEGATIVE 04/03/2018 2319   LEUKOCYTESUR NEGATIVE 04/03/2018 2319   Sepsis Labs: $RemoveBefo'@[email or web address]'[id number]$ (procalcitonin:4,lacticidven:4) )No results found for this or any previous visit (from the past 240 hour(s)).   Radiological Exams on Admission: DG Chest 2 View  Result Date: 03/11/2020 CLINICAL DATA:  Left-sided chest pain, shortness of breath, weakness, and lightheadedness for 1 week. EXAM: CHEST - 2 VIEW COMPARISON:  Radiograph 10/03/2019, additional priors FINDINGS: Mild chronic eventration of right hemidiaphragm. Heart size upper normal. The cardiomediastinal contours are normal. Prominence of the right paratracheal stripe is likely relating to overlapping vascular structures, and has been intermittently present on radiographs. The lungs are clear. Pulmonary vasculature is normal. No consolidation, pleural effusion, or pneumothorax. No acute osseous abnormalities are seen. IMPRESSION: Borderline cardiomegaly.  No acute pulmonary process. Electronically Signed   By: Keith Rake M.D.   On: 03/11/2020 15:14   CT Head Wo Contrast  Result Date: 03/11/2020 CLINICAL DATA:  Headache and dizziness for 3 days. EXAM: CT HEAD  WITHOUT CONTRAST TECHNIQUE: Contiguous axial images were obtained from the base of the skull through the vertex without intravenous contrast. COMPARISON:  Head CT 05/12/2019 and MRI 02/09/2020 FINDINGS: Brain: There is no evidence of an acute infarct, intracranial hemorrhage, mass, midline shift, or extra-axial fluid collection. The ventricles and sulci are normal. Vascular: Calcified atherosclerosis at the skull base. No hyperdense vessel. Skull: No fracture or suspicious osseous lesion. Sinuses/Orbits: Visualized paranasal sinuses and mastoid air cells are clear. Unremarkable orbits. Other: None. IMPRESSION: Unremarkable  CT appearance of the brain. Electronically Signed   By: Logan Bores M.D.   On: 03/11/2020 17:53    EKG: Independently reviewed. Sinus rhythm, anterior Q-waves.   Assessment/Plan   1. Hypertensive crisis  - Presents with multiple complaints including chest pain and headache and is found to have BP as high as 225/99  - Head CT unremarkable, HS troponin slightly elevated and decreasing  - She only takes her hydralazine once/day and had not taken today  - BP improving in ED on NTG infusion in ED and after IV labetalol  - Resume hydralazine with dose now, continue to titrate NTG infusion for goal SBP 165-180 tonight    2. Chest pain  - Presents with chest pain, HS troponin slightly elevated and decreasing, no acute findings on CXR  - She had normal coronaries on cath in May 2021  - Continue BP-control, cardiac monitoring    3. ESRD  - Patient reports completing PD 03/10/20 - Nephrology consulted by ED physician, no indication for urgent HD  - Continue phosphate binder, fluid-restriction, renally-dose medications   4. Myoclonia  - Patient reports progressive worsening over several months, now limiting her ability to perform ADLs - Evaluated by neurology in hospital last month, had unremarkable MRI brain, EEG wnl, normal B12  - Gabapentin recently reduced to renal dosing; Xanax  possibly contributing, tapering down    5. Anemia  - Appears stable, no bleeding    6. Anxiety  - Continue Xanax for now but possibly contributing to her neurologic issues, will taper down dosing    DVT prophylaxis: sq heparin  Code Status: Full  Level of Care: Level of care: Progressive Family Communication: None available  Disposition Plan:  Patient is from: Home  Anticipated d/c is to: TBD  Anticipated d/c date is: 03/13/20 Patient currently: Pending BP-control, improvement in headache and chest pain, therapy assessment   Consults called: nephrology consulted by ED physician  Admission status: Inpatient     Vianne Bulls, MD [facility]  03/11/2020, 9:27 PM

## 2020-03-11 NOTE — ED Provider Notes (Signed)
4:45 PM-checkout from Dr. Tomi Bamberger to evaluate patient, after head CT to evaluate for hypertension with headache, and rule out bleeding.  7:45 PM-brain scan no acute.  Patient persistently hypertensive, despite being now on nitro drip, at 25 mcg/min.  Will change to Cleviprex infusion to control blood pressure.  At this time she is alert and cooperative.  She has nonspecific shaking, of face, with blinking eyes, and irregular movements of her left arm and hand.  Patient also points out that her "stomach is shaking."  No respiratory distress.  No dysarthria or aphasia.  She states that last week her nephrologist told her to increase her gabapentin dose from 600 mg a day to 100 mg a day which is the renal dose.  She is currently on peritoneal dialysis.  Chest x-ray does not indicate failure.  She takes hydralazine daily.  EKG and troponin do not indicate acute MI.   EKG Interpretation  Date/Time:  Monday March 11 2020 14:18:38 EST Ventricular Rate:  62 PR Interval:  152 QRS Duration: 95 QT Interval:  448 QTC Calculation: 455 R Axis:   61 Text Interpretation: Sinus rhythm Abnormal inferior Q waves Confirmed by Dorie Rank [phone]) on 03/11/2020 2:21:40 PM       Medical decision making-patient presenting with nonspecific symptoms, including chest pain, dizziness and headache.  She has trembling, of nonspecific nature, previously evaluated by  neurology with testing including MRI and EEG.  Monitor blood pressure elevated at this time, consistent with hypertensive urgency.  Patient will require hospitalization for stabilization.  She takes peritoneal dialysis, nephrology consulted for management.  Hospitalist will be consulted for hospitalization.  Notification for intracranial bleeding or stroke.  .Critical Care Performed by: Daleen Bo, MD Authorized by: Daleen Bo, MD   Critical care provider statement:    Critical care time (minutes):  35   Critical care start time:  03/11/2020 4:45 PM    Critical care end time:  03/11/2020 8:29 PM   Critical care time was exclusive of:  Separately billable procedures and treating other patients   Critical care was time spent personally by me on the following activities:  Blood draw for specimens, development of treatment plan with patient or surrogate, discussions with consultants, evaluation of patient's response to treatment, examination of patient, obtaining history from patient or surrogate, ordering and performing treatments and interventions, ordering and review of laboratory studies, pulse oximetry, re-evaluation of patient's condition, review of old charts and ordering and review of radiographic studies  8:30 PM-Consult complete with hospital. Patient case explained and discussed.  He agrees to admit patient for further evaluation and treatment. Call ended at 8:45 PM    Daleen Bo, MD 03/11/20 [phone]

## 2020-03-11 NOTE — ED Notes (Signed)
Patient complaining of a bad headache. This RN also noted patient to be twitching and rolling her eyes. Provider made aware.

## 2020-03-11 NOTE — ED Provider Notes (Signed)
[facility] EMERGENCY DEPARTMENT Provider Note   CSN: [id number] Arrival date & time: 03/11/20  1314     History Chief Complaint  Patient presents with  . Chest Pain  . Dizziness    Patricia [name] is a 46 y.o. female.  HPI   Patient presents to the ED with complaints of chest pain, dizziness, shaking and overall feeling poor.  Patient states her symptoms started several days ago.  She has felt dizzy as if she is off balance.  She felt like at times it has been hard for her to speak.  She is feeling weak but it is diffuse and all over.  Patient also states she has been having intermittent left-sided chest pain.  Is not radiating.  Not have any fevers chills or coughing.  No shortness of breath.  No vomiting or diarrhea.  Patient has a history of chronic kidney disease and recently switch from hemodialysis to peritoneal dialysis.  She last dialyzed last evening.  Past Medical History:  Diagnosis Date  . Acid reflux   . Anemia 04/2019   REQUIRING BLOOD TRANSFUSIONS  . Anxiety   . Arthritis   . Dyspnea   . Dysrhythmia    tachycardia  . ESRD (end stage renal disease) ([place])    [facility]- [place]  . GI bleeding 12/2017  . Gout   . Headache(784.0)    "related to chemo; sometimes weekly" (09/12/2013)  . High cholesterol   . History of blood transfusion "a couple"   "related to low counts"  . Hypertension   . Iron deficiency anemia    "get epogen shots q month" (02/20/2014)  . 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 Peritoneal dialysis status ([facility])   . Stroke [facility]) 08/15/2017    Patient Active Problem List   Diagnosis Date Noted  . Pericolonic abscess 02/06/2020  . Chest pain 05/10/2019  . Anemia 04/25/2019  . SVT (supraventricular tachycardia) ([place]) 04/25/2019  . Elevated troponin 04/25/2019  . ESRD on hemodialysis ([place]) 04/25/2019  . Acute on chronic blood loss anemia 05/21/2018  . GI  bleed 05/20/2018  . Symptomatic anemia 10/28/2017  . ESRD on peritoneal dialysis ([place]) 04/01/2016  . Anemia due to chronic kidney disease 04/01/2016  . GERD (gastroesophageal reflux disease) 04/01/2016  . Nephrotic syndrome 10/02/2013  . Dyslipidemia 10/02/2013  . PAT (paroxysmal atrial tachycardia) ([place]) 09/29/2013  . Normocytic anemia 06/29/2013  . Glomerulonephritis 01/28/2013  . Morbid obesity ([place]) 07/04/2011  . Hypertension 07/04/2011  . Hypercholesterolemia 07/04/2011    Past Surgical History:  Procedure Laterality Date  . ABDOMINAL HYSTERECTOMY  2010   "laparoscopic"  . ANKLE FRACTURE SURGERY Right 1994  . AV FISTULA PLACEMENT Left 04/14/2016   Procedure: ARTERIOVENOUS (AV) FISTULA CREATION;  Surgeon: Angelia Mould, MD;  Location: [facility] OR;  Service: Vascular;  Laterality: Left;  . AV FISTULA PLACEMENT Left 08/09/2017   Procedure: INSERTION OF ARTERIOVENOUS (AV) GORE-TEX GRAFT LEFT ARM;  Surgeon: Elam Dutch, MD;  Location: [place];  Service: Vascular;  Laterality: Left;  . [place] REMOVAL Left 08/11/2017   Procedure: REMOVAL OF ARTERIOVENOUS GORETEX GRAFT ([place]);  Surgeon: Serafina Mitchell, MD;  Location: [facility] OR;  Service: Vascular;  Laterality: Left;  . BIOPSY  09/03/2017   Procedure: BIOPSY;  Surgeon: Otis Brace, MD;  Location: [place];  Service: Gastroenterology;;  . BIOPSY  12/24/2017   Procedure: BIOPSY;  Surgeon: Irving Copas., MD;  Location: [facility] ENDOSCOPY;  Service: Gastroenterology;;  . CARDIAC CATHETERIZATION  2000's  . COLONOSCOPY WITH PROPOFOL N/A 09/04/2017   Procedure: COLONOSCOPY WITH PROPOFOL;  Surgeon: Ronnette Juniper, MD;  Location: [place];  Service: Gastroenterology;  Laterality: N/A;  . ENTEROSCOPY N/A 12/24/2017   Procedure: ENTEROSCOPY;  Surgeon: Rush Landmark Telford Nab., MD;  Location: [place];  Service: Gastroenterology;  Laterality: N/A;  . ESOPHAGOGASTRODUODENOSCOPY    . ESOPHAGOGASTRODUODENOSCOPY (EGD) WITH PROPOFOL Left  06/04/2017   Procedure: ESOPHAGOGASTRODUODENOSCOPY (EGD) WITH PROPOFOL;  Surgeon: Arta Silence, MD;  Location: [place];  Service: Endoscopy;  Laterality: Left;  . ESOPHAGOGASTRODUODENOSCOPY (EGD) WITH PROPOFOL N/A 09/03/2017   Procedure: ESOPHAGOGASTRODUODENOSCOPY (EGD) WITH PROPOFOL;  Surgeon: Otis Brace, MD;  Location: [place];  Service: Gastroenterology;  Laterality: N/A;  . FISTULA SUPERFICIALIZATION Left 04/02/2017   Procedure: FISTULA SUPERFICIALIZATION LEFT ARM;  Surgeon: Serafina Mitchell, MD;  Location: [place];  Service: Vascular;  Laterality: Left;  . FISTULOGRAM Left 04/07/2016   Procedure: FISTULOGRAM;  Surgeon: Waynetta Sandy, MD;  Location: [place];  Service: Vascular;  Laterality: Left;  . FRACTURE SURGERY     right ankle  . GIVENS CAPSULE STUDY N/A 10/29/2017   Procedure: GIVENS CAPSULE STUDY;  Surgeon: Wilford Corner, MD;  Location: [place];  Service: Endoscopy;  Laterality: N/A;  . GIVENS CAPSULE STUDY N/A 12/24/2017   Procedure: GIVENS CAPSULE STUDY;  Surgeon: Irving Copas., MD;  Location: [place];  Service: Gastroenterology;  Laterality: N/A;  . GIVENS CAPSULE STUDY N/A 05/22/2018   Procedure: GIVENS CAPSULE STUDY;  Surgeon: Irving Copas., MD;  Location: [place];  Service: Gastroenterology;  Laterality: N/A;  . INSERTION OF DIALYSIS CATHETER Right 04/07/2016   Procedure: INSERTION OF DIALYSIS CATHETER;  Surgeon: Waynetta Sandy, MD;  Location: [place];  Service: Vascular;  Laterality: Right;  . INSERTION OF DIALYSIS CATHETER Right 04/04/2017   Procedure: INSERTION OF DIALYSIS CATHETER;  Surgeon: Waynetta Sandy, MD;  Location: [place];  Service: Vascular;  Laterality: Right;  . LEFT HEART CATH AND CORONARY ANGIOGRAPHY N/A 05/12/2019   Procedure: LEFT HEART CATH AND CORONARY ANGIOGRAPHY;  Surgeon: Dixie Dials, MD;  Location: [place] [facility];  Service: Cardiovascular;  Laterality: N/A;  . LIGATION OF  ARTERIOVENOUS  FISTULA Left 04/14/2016   Procedure: LIGATION OF ARTERIOVENOUS  FISTULA;  Surgeon: Angelia Mould, MD;  Location: [place];  Service: Vascular;  Laterality: Left;  . PATCH ANGIOPLASTY Left 06/19/2016   Procedure: PATCH ANGIOPLASTY LEFT ARTERIOVENOUS FISTULA;  Surgeon: Angelia Mould, MD;  Location: [place];  Service: Vascular;  Laterality: Left;  . POLYPECTOMY  09/04/2017   Procedure: POLYPECTOMY;  Surgeon: Ronnette Juniper, MD;  Location[facility];  Service: Gastroenterology;;  . REVISON OF ARTERIOVENOUS FISTULA Left 06/19/2016   Procedure: REVISION SUPERFICIALIZATION OF BRACHIOCEPHALIC ARTERIOVENOUS FISTULA;  Surgeon: Angelia Mould, MD;  Location: [facility] OR;  Service: Vascular;  Laterality: Left;     OB History    Gravida  3   Para      Term      Preterm      AB      Living        SAB      IAB      Ectopic      Multiple  3   Live Births              Family History  Problem Relation Age of Onset  . Hypertension Mother   . Thyroid disease Mother   .  Coronary artery disease Father   . Hypertension Father   . Diabetes Father   . Colon cancer Maternal Aunt   . Esophageal cancer Maternal Uncle   . Inflammatory bowel disease Neg Hx   . Liver disease Neg Hx   . Pancreatic cancer Neg Hx   . Rectal cancer Neg Hx   . Stomach cancer Neg Hx     Social History   Tobacco Use  . Smoking status: Never Smoker  . Smokeless tobacco: Never Used  Vaping Use  . Vaping Use: Never used  Substance Use Topics  . Alcohol use: No  . Drug use: No    Home Medications Prior to Admission medications   Medication Sig Start Date End Date Taking? Authorizing Provider  allopurinol (ZYLOPRIM) 100 MG tablet Take 100 mg by mouth at bedtime.     [provider]  ALPRAZolam Duanne Moron) 1 MG tablet Take 1 mg by mouth 2 (two) times daily as needed for anxiety.  03/19/17   [provider]  amoxicillin-clavulanate (AUGMENTIN) 500-125 MG tablet Take 1  tablet (500 mg total) by mouth every evening. 02/16/20   Lavina Hamman, MD  atorvastatin (LIPITOR) 40 MG tablet Take 1 tablet (40 mg total) by mouth daily at 6 PM. 05/13/19   Dixie Dials, MD  calcitRIOL (ROCALTROL) 0.5 MCG capsule Take 1 capsule (0.5 mcg total) by mouth daily with breakfast. Patient taking differently: Take 0.5 mcg by mouth 2 (two) times daily. 06/04/17   Bonnielee Haff, MD  ferric citrate (AURYXIA) 1 GM 210 MG(Fe) tablet Take 3 tablets (630 mg total) by mouth 3 (three) times daily with meals. 04/28/19   Elodia Florence., MD  gabapentin (NEURONTIN) 300 MG capsule Take 300 mg by mouth 2 (two) times daily. 02/01/20   [provider]  hydrALAZINE (APRESOLINE) 50 MG tablet Take 50 mg by mouth 2 (two) times daily. 10/06/19   [provider]  hydrOXYzine (ATARAX/VISTARIL) 25 MG tablet Take 25 mg by mouth every 6 (six) hours as needed for itching. 08/16/19   [provider]  multivitamin (RENA-VIT) TABS tablet Take 1 tablet by mouth at bedtime.     [provider]  omeprazole (PRILOSEC) 40 MG capsule Take 40 mg by mouth 2 (two) times daily.    [provider]  promethazine (PHENERGAN) 25 MG tablet Take 1 tablet (25 mg total) by mouth every 8 (eight) hours as needed for nausea or vomiting. 04/07/18   Shirley, [place], DO  saccharomyces boulardii (FLORASTOR) 250 MG capsule Take 1 capsule (250 mg total) by mouth 2 (two) times daily. 02/16/20   Lavina Hamman, MD  zolpidem (AMBIEN) 5 MG tablet Take 1 tablet (5 mg total) by mouth at bedtime as needed for sleep. 02/16/20   Lavina Hamman, MD    Allergies    Nsaids and Tolmetin  Review of Systems   Review of Systems  All other systems reviewed and are negative.   Physical Exam Updated Vital Signs BP (!) 215/101   Pulse 66   Temp 98 F (36.7 C) (Oral)   Resp 18   SpO2 100%   Physical Exam Vitals and nursing note reviewed.  Constitutional:      General: She is not in acute distress.     Appearance: She is well-developed and well-nourished.  HENT:     Head: Normocephalic and atraumatic.     Right Ear: External ear normal.     Left Ear: External ear normal.  Mouth/Throat:     Mouth: Oropharynx is clear and moist.  Eyes:     General: No scleral icterus.       Right eye: No discharge.        Left eye: No discharge.     Conjunctiva/sclera: Conjunctivae normal.  Neck:     Trachea: No tracheal deviation.  Cardiovascular:     Rate and Rhythm: Normal rate and regular rhythm.     Pulses: Intact distal pulses.  Pulmonary:     Effort: Pulmonary effort is normal. No respiratory distress.     Breath sounds: Normal breath sounds. No stridor. No wheezing or rales.  Abdominal:     General: Bowel sounds are normal. There is no distension.     Palpations: Abdomen is soft.     Tenderness: There is no abdominal tenderness. There is no guarding or rebound.  Musculoskeletal:        General: No tenderness or edema.     Cervical back: Neck supple.  Skin:    General: Skin is warm and dry.     Findings: No rash.  Neurological:     Mental Status: She is alert and oriented to person, place, and time.     Cranial Nerves: No cranial nerve deficit (No facial droop, extraocular movements intact, tongue midline ).     Sensory: No sensory deficit.     Motor: Tremor present. No abnormal muscle tone or seizure activity.     Coordination: Coordination normal.     Deep Tendon Reflexes: Strength normal.     Comments: No pronator drift bilateral upper extrem, able to hold both legs off bed for 5 seconds, sensation intact in all extremities, no visual field cuts, no left or right sided neglect,no nystagmus noted   Psychiatric:        Mood and Affect: Mood and affect normal.     ED Results / Procedures / Treatments   Labs (all labs ordered are listed, but only abnormal results are displayed) Labs Reviewed  BASIC METABOLIC PANEL - Abnormal; Notable for the following components:      Result  Value   Chloride 96 (*)    Glucose, Bld 107 (*)    BUN 32 (*)    Creatinine, Ser 11.80 (*)    Calcium 10.4 (*)    GFR, Estimated 4 (*)    Anion gap 18 (*)    All other components within normal limits  CBC - Abnormal; Notable for the following components:   RBC 3.17 (*)    Hemoglobin 8.9 (*)    HCT 28.5 (*)    RDW 16.5 (*)    All other components within normal limits  I-STAT BETA HCG BLOOD, ED ([facility], [facility], [facility] ONLY) - Abnormal; Notable for the following components:   I-stat hCG, quantitative 8.8 (*)    All other components within normal limits  TROPONIN I (HIGH SENSITIVITY) - Abnormal; Notable for the following components:   Troponin I (High Sensitivity) 24 (*)    All other components within normal limits  I-STAT VENOUS BLOOD GAS, ED  TROPONIN I (HIGH SENSITIVITY)    EKG EKG Interpretation  Date/Time:  Monday March 11 2020 14:18:38 EST Ventricular Rate:  62 PR Interval:  152 QRS Duration: 95 QT Interval:  448 QTC Calculation: 455 R Axis:   61 Text Interpretation: Sinus rhythm Abnormal inferior Q waves Confirmed by Dorie Rank [phone]) on 03/11/2020 2:21:40 PM   Radiology DG Chest 2 View  Result Date: 03/11/2020 CLINICAL DATA:  Left-sided chest pain, shortness of breath, weakness, and lightheadedness for 1 week. EXAM: CHEST - 2 VIEW COMPARISON:  Radiograph 10/03/2019, additional priors FINDINGS: Mild chronic eventration of right hemidiaphragm. Heart size upper normal. The cardiomediastinal contours are normal. Prominence of the right paratracheal stripe is likely relating to overlapping vascular structures, and has been intermittently present on radiographs. The lungs are clear. Pulmonary vasculature is normal. No consolidation, pleural effusion, or pneumothorax. No acute osseous abnormalities are seen. IMPRESSION: Borderline cardiomegaly.  No acute pulmonary process. Electronically Signed   By: Keith Rake M.D.   On: 03/11/2020 15:14    Procedures Procedures   Medications  Ordered in ED Medications  labetalol (NORMODYNE) injection 20 mg (has no administration in time range)  acetaminophen (TYLENOL) tablet 650 mg (has no administration in time range)  aspirin chewable tablet 324 mg (has no administration in time range)    ED Course  I have reviewed the triage vital signs and the nursing notes.  Pertinent labs & imaging results that were available during my care of the patient were reviewed by me and considered in my medical decision making (see chart for details).  Clinical Course as of 03/12/20 0658  Mon Mar 11, 2020  1508 White blood cell count is normal.  Hemoglobin is stable. [BJ]  6283 Blood pressure increasing now at 215/101. [TD]  1761 Labs reviewed.  Consistent with her chronic kidney disease.  I-STAT of hCG of 8.8 is not consistent with pregnancy. [JK]  1605 Troponin is elevated at 24.  Increased from previous values. [JK]  2021 I discussed the case with Dr. Justin Mend, nephrologist.  He will have patient seen tomorrow by the consulting nephrology team.  He agrees with treating for hypertensive urgency. [EW]    Clinical Course User Index [EW] Daleen Bo, MD [JK] Dorie Rank, MD   MDM Rules/Calculators/A&P                          Patient presents to the ED with complaints of dizziness and tremulousness.  On exam she has no focal deficits.  Patient is complaining of tremulousness.  Previous records have been reviewed and patient does have a history of dyskinesia and neuropathy.  Last admission, MRI, neuro consult, eeg for similar complaints.   Patient however also has significantly elevated blood pressure.  She has a slightly elevated troponin.  Concerned about the possibility of hypertensive urgency, less likely ACS.  Will start on aspirin.  Labetalol ordered.  Will need to monitor for PRESS syndrome. Plan admission to hospital for further treatment.  Final Clinical Impression(s) / ED Diagnoses Final diagnoses:  Hypertensive urgency  Stage 5  chronic kidney disease on chronic dialysis [facility])      Dorie Rank, MD 03/12/20 [id number]

## 2020-03-12 LAB — CBC
HCT: 23.8 % — ABNORMAL LOW (ref 36.0–46.0)
Hemoglobin: 7.6 g/dL — ABNORMAL LOW (ref 12.0–15.0)
MCH: 28.3 pg (ref 26.0–34.0)
MCHC: 31.9 g/dL (ref 30.0–36.0)
MCV: 88.5 fL (ref 80.0–100.0)
Platelets: 256 10*3/uL (ref 150–400)
RBC: 2.69 MIL/uL — ABNORMAL LOW (ref 3.87–5.11)
RDW: 16.4 % — ABNORMAL HIGH (ref 11.5–15.5)
WBC: 7.2 10*3/uL (ref 4.0–10.5)
nRBC: 0 % (ref 0.0–0.2)

## 2020-03-12 LAB — BASIC METABOLIC PANEL
Anion gap: 17 — ABNORMAL HIGH (ref 5–15)
BUN: 38 mg/dL — ABNORMAL HIGH (ref 6–20)
CO2: 23 mmol/L (ref 22–32)
Calcium: 10 mg/dL (ref 8.9–10.3)
Chloride: 96 mmol/L — ABNORMAL LOW (ref 98–111)
Creatinine, Ser: 12.85 mg/dL — ABNORMAL HIGH (ref 0.44–1.00)
GFR, Estimated: 3 mL/min — ABNORMAL LOW (ref >60)
Glucose, Bld: 104 mg/dL — ABNORMAL HIGH (ref 70–99)
Potassium: 3.6 mmol/L (ref 3.5–5.1)
Sodium: 136 mmol/L (ref 135–145)

## 2020-03-12 LAB — MRSA PCR SCREENING: MRSA by PCR: NEGATIVE

## 2020-03-12 LAB — SARS CORONAVIRUS 2 (TAT 6-24 HRS): SARS Coronavirus 2: NEGATIVE

## 2020-03-12 MED ORDER — CARVEDILOL 3.125 MG PO TABS: 3.1250 mg | ORAL_TABLET | Freq: Two times a day (BID) | ORAL | Status: DC

## 2020-03-12 MED ORDER — HEPARIN 1000 UNIT/ML FOR PERITONEAL DIALYSIS
5000.0000 [IU] | INTRAMUSCULAR | Status: DC | PRN
  Filled 2020-03-12: qty 5

## 2020-03-12 MED ORDER — DELFLEX-LC/2.5% DEXTROSE 394 MOSM/L IP SOLN: INTRAPERITONEAL | Status: DC

## 2020-03-12 MED ORDER — DELFLEX-LC/4.25% DEXTROSE 483 MOSM/L IP SOLN: INTRAPERITONEAL | Status: DC

## 2020-03-12 MED ORDER — LISINOPRIL 20 MG PO TABS: 20.0000 mg | ORAL_TABLET | Freq: Every day | ORAL | Status: DC

## 2020-03-12 MED ORDER — HEPARIN 1000 UNIT/ML FOR PERITONEAL DIALYSIS
INTRAPERITONEAL | Status: DC | PRN
  Filled 2020-03-12 (×2): qty 5000

## 2020-03-12 MED ORDER — GENTAMICIN SULFATE 0.1 % EX OINT
1.0000 "application " | TOPICAL_OINTMENT | Freq: Every day | CUTANEOUS | Status: DC
  Administered 2020-03-14 – 2020-03-19 (×5): 1 via TOPICAL
  Filled 2020-03-12: qty 15

## 2020-03-12 MED ORDER — METOPROLOL TARTRATE 50 MG PO TABS
50.0000 mg | ORAL_TABLET | Freq: Two times a day (BID) | ORAL | Status: DC
  Administered 2020-03-12 – 2020-03-13 (×3): 50 mg via ORAL
  Filled 2020-03-12 (×3): qty 1

## 2020-03-12 NOTE — ED Notes (Signed)
Assisted to use a bedside commode to defecate , repositioned on bed /complete linen change .

## 2020-03-12 NOTE — ED Notes (Signed)
Lunch Tray Ordered @ 1015. 

## 2020-03-12 NOTE — Consult Note (Addendum)
[facility] Renal Consultation Note    Indication for Consultation:  Management of ESRD/hemodialysis; anemia, hypertension/volume and secondary hyperparathyroidism PCP:  HPI: Patricia [name] is a 45 y.o. female with ESRD disease on CCPD at [facility]. PMH: MCGN, CVA, HTN, obesity, GIB, Gout, myoclonus,  SHPT, Anemia of ESRD. Recent admission [facility] 01/31-02/11/22 pericolonic abscess. Returned to HD D/T poor clearance. Apparently has now resumed PD.   She presented to HD 03/11/20 with C/O chest pain, HA and increased myclonus. SCr 11.8 BUN 32 on admission. CBC unremarkable. Troponin low and flat trend. CT of head negative. CXR unremarkable. She was noted to have BP 225/99 HR 78 on admission. She has been admitted for hypertensive crisis.   Seen at bedside with daughter and caregiver, Thayer Headings. She has not been taking hydralazine as directed at home. Has only been taking once a day. Per daughter, last HD 03/01/2020. She resumed PD 03/03/2020. Since resuming PD, apparently she has had exacerbation of eye fluttering movements and myoclonus. Concern is that constellation of symptoms may be driven by under dialysis. She has been told that she may have to return to HD. Patient is not happy with this option but daughter agrees that we must do what is best for patient.     Past Medical History:  Diagnosis Date  . Acid reflux   . Anemia 04/2019   REQUIRING BLOOD TRANSFUSIONS  . Anxiety   . Arthritis   . Dyspnea   . Dysrhythmia    tachycardia  . ESRD (end stage renal disease) ([place])    [facility]THSAT- [place]  . GI bleeding 12/2017  . Gout   . Headache(784.0)    "related to chemo; sometimes weekly" (09/12/2013)  . High cholesterol   . History of blood transfusion "a couple"   "related to low counts"  . Hypertension   . Iron deficiency anemia    "get epogen shots q month" (02/20/2014)  . MCGN (minimal change glomerulonephritis)    "using chemo to tx;  finished my  last tx in 12/2013"  . Peritoneal dialysis status ([place])   . Stroke [facility]) 08/15/2017   Past Surgical History:  Procedure Laterality Date  . ABDOMINAL HYSTERECTOMY  2010   "laparoscopic"  . ANKLE FRACTURE SURGERY Right 1994  . AV FISTULA PLACEMENT Left 04/14/2016   Procedure: ARTERIOVENOUS (AV) FISTULA CREATION;  Surgeon: Angelia Mould, MD;  Location[facility] OR;  Service: Vascular;  Laterality: Left;  . AV FISTULA PLACEMENT Left 08/09/2017   Procedure: INSERTION OF ARTERIOVENOUS (AV) GORE-TEX GRAFT LEFT ARM;  Surgeon: Elam Dutch, MD;  Location: [place];  Service: Vascular;  Laterality: Left;  . [place] REMOVAL Left 08/11/2017   Procedure: REMOVAL OF ARTERIOVENOUS GORETEX GRAFT ([place]);  Surgeon: Serafina Mitchell, MD;  Location: [facility] OR;  Service: Vascular;  Laterality: Left;  . BIOPSY  09/03/2017   Procedure: BIOPSY;  Surgeon: Otis Brace, MD;  Location: [place];  Service: Gastroenterology;;  . BIOPSY  12/24/2017   Procedure: BIOPSY;  Surgeon: Irving Copas., MD;  Location: [place];  Service: Gastroenterology;;  . CARDIAC CATHETERIZATION  2000's  . COLONOSCOPY WITH PROPOFOL N/A 09/04/2017   Procedure: COLONOSCOPY WITH PROPOFOL;  Surgeon: Ronnette Juniper, MD;  Location: [place];  Service: Gastroenterology;  Laterality: N/A;  . ENTEROSCOPY N/A 12/24/2017   Procedure: ENTEROSCOPY;  Surgeon: Rush Landmark Telford Nab., MD;  Location: [place];  Service: Gastroenterology;  Laterality: N/A;  . ESOPHAGOGASTRODUODENOSCOPY    . ESOPHAGOGASTRODUODENOSCOPY (EGD) WITH PROPOFOL Left 06/04/2017  Procedure: ESOPHAGOGASTRODUODENOSCOPY (EGD) WITH PROPOFOL;  Surgeon: Arta Silence, MD;  Location[facility] ENDOSCOPY;  Service: Endoscopy;  Laterality: Left;  . ESOPHAGOGASTRODUODENOSCOPY (EGD) WITH PROPOFOL N/A 09/03/2017   Procedure: ESOPHAGOGASTRODUODENOSCOPY (EGD) WITH PROPOFOL;  Surgeon: Otis Brace, MD;  Location: [place];  Service: Gastroenterology;  Laterality: N/A;  .  FISTULA SUPERFICIALIZATION Left 04/02/2017   Procedure: FISTULA SUPERFICIALIZATION LEFT ARM;  Surgeon: Serafina Mitchell, MD;  Location: [place];  Service: Vascular;  Laterality: Left;  . FISTULOGRAM Left 04/07/2016   Procedure: FISTULOGRAM;  Surgeon: Waynetta Sandy, MD;  Location: [place];  Service: Vascular;  Laterality: Left;  . FRACTURE SURGERY     right ankle  . GIVENS CAPSULE STUDY N/A 10/29/2017   Procedure: GIVENS CAPSULE STUDY;  Surgeon: Wilford Corner, MD;  Location: [place];  Service: Endoscopy;  Laterality: N/A;  . GIVENS CAPSULE STUDY N/A 12/24/2017   Procedure: GIVENS CAPSULE STUDY;  Surgeon: Irving Copas., MD;  Location: [place];  Service: Gastroenterology;  Laterality: N/A;  . GIVENS CAPSULE STUDY N/A 05/22/2018   Procedure: GIVENS CAPSULE STUDY;  Surgeon: Irving Copas., MD;  Location: [place];  Service: Gastroenterology;  Laterality: N/A;  . INSERTION OF DIALYSIS CATHETER Right 04/07/2016   Procedure: INSERTION OF DIALYSIS CATHETER;  Surgeon: Waynetta Sandy, MD;  Location: [place];  Service: Vascular;  Laterality: Right;  . INSERTION OF DIALYSIS CATHETER Right 04/04/2017   Procedure: INSERTION OF DIALYSIS CATHETER;  Surgeon: Waynetta Sandy, MD;  Location: [place];  Service: Vascular;  Laterality: Right;  . LEFT HEART CATH AND CORONARY ANGIOGRAPHY N/A 05/12/2019   Procedure: LEFT HEART CATH AND CORONARY ANGIOGRAPHY;  Surgeon: Dixie Dials, MD;  Location: [place] CV LAB;  Service: Cardiovascular;  Laterality: N/A;  . LIGATION OF ARTERIOVENOUS  FISTULA Left 04/14/2016   Procedure: LIGATION OF ARTERIOVENOUS  FISTULA;  Surgeon: Angelia Mould, MD;  Location: [place];  Service: Vascular;  Laterality: Left;  . PATCH ANGIOPLASTY Left 06/19/2016   Procedure: PATCH ANGIOPLASTY LEFT ARTERIOVENOUS FISTULA;  Surgeon: Angelia Mould, MD;  Location: [place];  Service: Vascular;  Laterality: Left;  . POLYPECTOMY  09/04/2017    Procedure: POLYPECTOMY;  Surgeon: Ronnette Juniper, MD;  Location: [facility] ENDOSCOPY;  Service: Gastroenterology;;  . REVISON OF ARTERIOVENOUS FISTULA Left 06/19/2016   Procedure: REVISION SUPERFICIALIZATION OF BRACHIOCEPHALIC ARTERIOVENOUS FISTULA;  Surgeon: Angelia Mould, MD;  Location: [facility] OR;  Service: Vascular;  Laterality: Left;   Family History  Problem Relation Age of Onset  . Hypertension Mother   . Thyroid disease Mother   . Coronary artery disease Father   . Hypertension Father   . Diabetes Father   . Colon cancer Maternal Aunt   . Esophageal cancer Maternal Uncle   . Inflammatory bowel disease Neg Hx   . Liver disease Neg Hx   . Pancreatic cancer Neg Hx   . Rectal cancer Neg Hx   . Stomach cancer Neg Hx    Social History:  reports that she has never smoked. She has never used smokeless tobacco. She reports that she does not drink alcohol and does not use drugs. Allergies  Allergen Reactions  . Nsaids Other (See Comments)    Cannot take due to Kidney disease/Kidney function   . Tolmetin Other (See Comments)    Cannot take due to Kidney Disease   Prior to Admission medications   Medication Sig Start Date End Date Taking? Authorizing Provider  allopurinol (ZYLOPRIM) 100 MG tablet Take 100 mg by mouth at  bedtime.    Yes [provider]  ALPRAZolam Duanne Moron) 1 MG tablet Take 1 mg by mouth 2 (two) times daily as needed for anxiety.  03/19/17  Yes [provider]  atorvastatin (LIPITOR) 40 MG tablet Take 1 tablet (40 mg total) by mouth daily at 6 PM. 05/13/19  Yes Dixie Dials, MD  gabapentin (NEURONTIN) 100 MG capsule Take 100 mg by mouth at bedtime. 02/28/20  Yes [provider]  hydrALAZINE (APRESOLINE) 50 MG tablet Take 50 mg by mouth at bedtime. 10/06/19  Yes [provider]  hydrOXYzine (ATARAX/VISTARIL) 25 MG tablet Take 25 mg by mouth every 6 (six) hours as needed for itching. 08/16/19  Yes [provider]  Lanthanum Carbonate  (FOSRENOL) 1000 MG PACK Take 1 packet by mouth with breakfast, with lunch, and with evening meal. 03/04/20  Yes [provider]  multivitamin (RENA-VIT) TABS tablet Take 1 tablet by mouth at bedtime.    Yes [provider]  omeprazole (PRILOSEC) 40 MG capsule Take 40 mg by mouth at bedtime.   Yes [provider]  promethazine (PHENERGAN) 25 MG tablet Take 1 tablet (25 mg total) by mouth every 8 (eight) hours as needed for nausea or vomiting. 04/07/18  Yes Enid Derry, [place], DO  zolpidem (AMBIEN) 5 MG tablet Take 1 tablet (5 mg total) by mouth at bedtime as needed for sleep. 02/16/20  Yes Lavina Hamman, MD  calcitRIOL (ROCALTROL) 0.5 MCG capsule Take 1 capsule (0.5 mcg total) by mouth daily with breakfast. Patient not taking: No sig reported 06/04/17   Bonnielee Haff, MD  ferric citrate (AURYXIA) 1 GM 210 MG(Fe) tablet Take 3 tablets (630 mg total) by mouth 3 (three) times daily with meals. Patient not taking: Reported on 03/11/2020 04/28/19   Elodia Florence., MD  saccharomyces boulardii (FLORASTOR) 250 MG capsule Take 1 capsule (250 mg total) by mouth 2 (two) times daily. Patient not taking: No sig reported 02/16/20   Lavina Hamman, MD   Current Facility-Administered Medications  Medication Dose Route Frequency Provider Last Rate Last Admin  . 0.9 %  sodium chloride infusion  250 mL Intravenous PRN Opyd, Ilene Qua, MD      . acetaminophen (TYLENOL) tablet 650 mg  650 mg Oral Q6H PRN Opyd, Ilene Qua, MD       Or  . acetaminophen (TYLENOL) suppository 650 mg  650 mg Rectal Q6H PRN Opyd, Ilene Qua, MD      . allopurinol (ZYLOPRIM) tablet 100 mg  100 mg Oral QHS Opyd, Ilene Qua, MD   100 mg at 03/11/20 2240  . ALPRAZolam (XANAX) tablet 0.5 mg  0.5 mg Oral BID PRN Opyd, Ilene Qua, MD      . atorvastatin (LIPITOR) tablet 40 mg  40 mg Oral q1800 Opyd, Ilene Qua, MD      . fentaNYL (SUBLIMAZE) injection 25-50 mcg  25-50 mcg Intravenous Q3H PRN Opyd, Ilene Qua, MD   25 mcg at  03/12/20 1150  . gabapentin (NEURONTIN) capsule 100 mg  100 mg Oral QHS Opyd, Ilene Qua, MD   100 mg at 03/11/20 2202  . heparin injection 5,000 Units  5,000 Units Subcutaneous Q8H Opyd, Ilene Qua, MD   5,000 Units at 03/12/20 0507  . hydrALAZINE (APRESOLINE) tablet 50 mg  50 mg Oral Q8H Opyd, Ilene Qua, MD   50 mg at 03/12/20 0503  . hydrOXYzine (ATARAX/VISTARIL) tablet 25 mg  25 mg Oral Q6H PRN Opyd, Ilene Qua, MD      .  labetalol (NORMODYNE) injection 10 mg  10 mg Intravenous Q2H PRN Opyd, Lavone Neri, MD   10 mg at 03/12/20 1030  . lanthanum (FOSRENOL) chewable tablet 1,000 mg  1,000 mg Oral TID with meals Opyd, Lavone Neri, MD   1,000 mg at 03/12/20 0801  . nitroGLYCERIN 50 mg in dextrose 5 % 250 mL (0.2 mg/mL) infusion  0-200 mcg/min Intravenous Continuous Opyd, Lavone Neri, MD 60 mL/hr at 03/12/20 0407 200 mcg/min at 03/12/20 0407  . ondansetron (ZOFRAN) tablet 4 mg  4 mg Oral Q6H PRN Opyd, Lavone Neri, MD       Or  . ondansetron (ZOFRAN) injection 4 mg  4 mg Intravenous Q6H PRN Opyd, Lavone Neri, MD   4 mg at 03/12/20 1149  . pantoprazole (PROTONIX) EC tablet 40 mg  40 mg Oral Daily Opyd, Lavone Neri, MD   40 mg at 03/12/20 1015  . senna-docusate (Senokot-S) tablet 1 tablet  1 tablet Oral QHS PRN Opyd, Lavone Neri, MD      . zolpidem (AMBIEN) tablet 5 mg  5 mg Oral QHS PRN Opyd, Lavone Neri, MD   5 mg at 03/11/20 2202   Labs: Basic Metabolic Panel: Recent Labs  Lab 03/11/20 1430 03/12/20 0446  NA 136 136  K 3.8 3.6  CL 96* 96*  CO2 22 23  GLUCOSE 107* 104*  BUN 32* 38*  CREATININE 11.80* 12.85*  CALCIUM 10.4* 10.0   Liver Function Tests: No results for input(s): AST, ALT, ALKPHOS, BILITOT, PROT, ALBUMIN in the last 168 hours. No results for input(s): LIPASE, AMYLASE in the last 168 hours. No results for input(s): AMMONIA in the last 168 hours. CBC: Recent Labs  Lab 03/11/20 1430 03/12/20 0446  WBC 7.6 7.2  HGB 8.9* 7.6*  HCT 28.5* 23.8*  MCV 89.9 88.5  PLT 303 256   Cardiac  Enzymes: No results for input(s): CKTOTAL, CKMB, CKMBINDEX, TROPONINI in the last 168 hours. CBG: No results for input(s): GLUCAP in the last 168 hours. Iron Studies: No results for input(s): IRON, TIBC, TRANSFERRIN, FERRITIN in the last 72 hours. Studies/Results: DG Chest 2 View  Result Date: 03/11/2020 CLINICAL DATA:  Left-sided chest pain, shortness of breath, weakness, and lightheadedness for 1 week. EXAM: CHEST - 2 VIEW COMPARISON:  Radiograph 10/03/2019, additional priors FINDINGS: Mild chronic eventration of right hemidiaphragm. Heart size upper normal. The cardiomediastinal contours are normal. Prominence of the right paratracheal stripe is likely relating to overlapping vascular structures, and has been intermittently present on radiographs. The lungs are clear. Pulmonary vasculature is normal. No consolidation, pleural effusion, or pneumothorax. No acute osseous abnormalities are seen. IMPRESSION: Borderline cardiomegaly.  No acute pulmonary process. Electronically Signed   By: Narda Rutherford M.D.   On: 03/11/2020 15:14   CT Head Wo Contrast  Result Date: 03/11/2020 CLINICAL DATA:  Headache and dizziness for 3 days. EXAM: CT HEAD WITHOUT CONTRAST TECHNIQUE: Contiguous axial images were obtained from the base of the skull through the vertex without intravenous contrast. COMPARISON:  Head CT 05/12/2019 and MRI 02/09/2020 FINDINGS: Brain: There is no evidence of an acute infarct, intracranial hemorrhage, mass, midline shift, or extra-axial fluid collection. The ventricles and sulci are normal. Vascular: Calcified atherosclerosis at the skull base. No hyperdense vessel. Skull: No fracture or suspicious osseous lesion. Sinuses/Orbits: Visualized paranasal sinuses and mastoid air cells are clear. Unremarkable orbits. Other: None. IMPRESSION: Unremarkable CT appearance of the brain. Electronically Signed   By: Sebastian Ache M.D.   On: 03/11/2020 17:53  ROS: As per HPI otherwise  negative.  Physical Exam: Vitals:   03/12/20 1115 03/12/20 1130 03/12/20 1153 03/12/20 1230  BP: (!) 192/99 (!) 173/94  (!) 171/99  Pulse: 94 84  83  Resp: (!) 25 (!) 24    Temp:   98.4 F (36.9 C)   TempSrc:   Oral   SpO2: 100% 100%  100%     General:Obese female in no acute distress. Head: Normocephalic, atraumatic, sclera non-icteric, mucus membranes are moist Neck: Supple. JVD slightly elevated.  Lungs: Clear bilaterally to auscultation without wheezes, rales, or rhonchi. Breathing is unlabored. Heart: RRR with S1 S2. No murmurs, rubs, or gallops appreciated. Abdomen: Soft, non-tender, non-distended with normoactive bowel sounds. No rebound/guarding. No obvious abdominal masses. M-S:  Strength and tone appear normal for age. Lower extremities:without edema or ischemic changes, no open wounds  Neuro: Alert and oriented X 3. Flapping movements of hands and upper extremities. Fluttering of eyelids with upward deviation of eyes.  Psych:  Responds to questions appropriately with a normal affect. Dialysis Access: PD catheter intact.   Dialysis Orders: Center: [facility].  7X Week, EDW 98 (kg) Ca 2.5 (mEq/L) Mg 0.5 (mEq/L), Dextrose 1.5; 2.5; 4.25 %, # Exchanges 5, fill vol 3000 mL cyc therapy vol 15000 mL cyc therapy time 9 hrs, Day Exchanges 1 daytime fill vol 3000, daytime exchange method Cycler  Assessment/Plan: 1.  Hypertensive Crisis: Multifactorial. Probably a combination of under dialysis, mild-moderate volume overload and medical noncompliance. Has been on hydralazine as monotherapy and only taking once daily.  Medication rec here does not match medication list at OP center. Resume metoprolol at 50 mg PO BID. Start Lisinopril 20 mg q HS IF these interventions do not successfully lower BP. Still on NTG gtt. Down-titrate and DC when possible.  Continue hydralazine at 50 mg PO Q 8 hours. Attempt to lower volume.  Will use 3 exchanges of 2.5% Dianeal solution with 2  exchanges of 4.25% Dianeal solution tonight. 2. Chest pain: Has been on hydralazine without BB. Resumed metoprolol. Vasodilator without BB may precipitate chest pain. Troponins low with flat trend. Per primary.  3.  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 Prior work up per neurology. Agree with renal dosing gabapentin, can continue benzodiazepine at current dose. .  4.  ESRD - Recent return to PD (03/03/2020) after being placed on hemodialysis for for two weeks D/T poor clearance (Last HD 02/25/20220. Unfortunately had TDC removed 03/11/2020. Low threshold for resuming HD. If labs and symptoms do not improve, will have IR place TDC and resume HD.   5.  Hypertension/volume  - See #1.  6.  Anemia  -HGB 7.6. Last ESA Mircera 225 mcg 03/11/2020. Will draw iron panel in AM. Follow HGB.  7.  Metabolic bone disease - Calcium 10.4. Not on VDRA. Continue binders. Checking RFP in AM.  8.  Nutrition - Renal diet with fld restrictions.  8.    Medical Noncompliance-ongoing history of not taking medications as directed. Discussed at length with her daughter. Needs assistance with compliance with home medications.   Matison Nuccio H. Owens Shark, NP-C 03/12/2020, 12:45 PM  [employer] [phone]

## 2020-03-12 NOTE — Progress Notes (Addendum)
PROGRESS NOTE  Patricia [name] XID:[id number] DOB: 1975/04/19 DOA: 03/11/2020 PCP: Doyle Askew, PA-C  Brief History   The patient is a 45 yr old woman who has a medical history significant for ESRD on PD, HTN, anxiety, chronic anemia, and myoclonis. She presented to [facility] ED  On 03/11/2020 with complaints of headache, chest pain, and increased shaking movements. Onset was following PD at home the prior evening.   Upon arrival to the ED, patient is found to be afebrile, saturating well on room air, and with blood pressure as high as 225/99.  EKG features sinus rhythm with abnormal anterior Q waves.  Head CT is unremarkable.  No acute findings on chest x-ray.  Chemistry panel features normal potassium, normal bicarbonate, BUN 32, creatinine 11.8, and calcium 10.4.  CBC features a stable normocytic anemia.  High-sensitivity troponin was slightly elevated and decreasing.  Nephrology was consulted by the ED physician and the patient was given aspirin, acetaminophen, labetalol, and started on nitroglycerin infusion.  [facility] were consulted to admit the patient for further evaluation and treatment. Nephrology and Cardiology were consulted.   The patient was admitted to a progressive bed. She was placed on a nitroglycerin gtt and restarted on her home antihypertensives. Her xanax dosage was reduced as it was felt to be contributing to the worsening of the patient's myoclonus.   The myoclonus has been thoroughly worked up by neurology including EEG, MRI brain, and normal B-12.  PD will be continued as inpatient.  Consultants  . Nephrology  Procedures  . Peritoneal dialysis  Antibiotics   Anti-infectives (From admission, onward)   None    .   Subjective  The patient is resting comfortably. She states that her headache is a little better. No new complaints.  Objective   Vitals:  Vitals:   03/12/20 1153 03/12/20 1230  BP:  (!) 171/99  Pulse:  83  Resp:    Temp:  98.4 F (36.9 C)   SpO2:  100%    Exam:  Constitutional:  . The patient is awake, alert, and oriented x 3. No acute distress. Eyes:  Marland Kitchen Fast eye movements, blinking. Respiratory:  . No increased work of breathing. . No wheezes, rales, or rhonchi . No tactile fremitus Cardiovascular:  . Regular rate and rhythm . No murmurs, ectopy, or gallups. . No lateral PMI. No thrills. Abdomen:  . Abdomen is soft, non-tender, non-distended . No hernias, masses, or organomegaly . Normoactive bowel sounds.  Musculoskeletal:  . No cyanosis, clubbing, or edema Skin:  . No rashes, lesions, ulcers . palpation of skin: no induration or nodules Neurologic:  . CN 2-12 intact . Sensation all 4 extremities intact . Pt has shaking movement of extremities and abnormal facial movements.  Psychiatric:  . Mental status o Mood, affect appropriate o Orientation to person, place, time  . judgment and insight appear intact  I have personally reviewed the following:   Today's Data  . Vitals, CBC, BMP  Imaging  . CT head . CXR  Cardiology Data  . EKG  Scheduled Meds: . allopurinol  100 mg Oral QHS  . atorvastatin  40 mg Oral q1800  . gabapentin  100 mg Oral QHS  . gentamicin ointment  1 application Topical Daily  . heparin  5,000 Units Subcutaneous Q8H  . hydrALAZINE  50 mg Oral Q8H  . lanthanum  1,000 mg Oral TID with meals  . metoprolol tartrate  50 mg Oral BID  . pantoprazole  40 mg  Oral Daily   Continuous Infusions: . sodium chloride    . dialysis solution 2.5% low-MG/low-CA    . dialysis solution 4.25% low-MG/low-CA    . nitroGLYCERIN 200 mcg/min (03/12/20 1418)    Principal Problem:   Hypertensive crisis without congestive heart failure Active Problems:   Normocytic anemia   ESRD on peritoneal dialysis ([place])   Chest pain   Myoclonia   LOS: 1 day   A & P  Hypertensive crisis: Blood pressure is improved by 15% in the first day. She is receiving hydralazine 50 mg Q8 hours  and NTg gtt. The patient no longer complains of chest pain, although she does still have a headache.   Chest pain: Resolved with improvement in blood pressure. Presents with chest pain, HS troponin slightly elevated and decreasing, no acute findings on CXR. She had normal coronaries on cath in May 2021. Continue BP-control, cardiac monitoring.  ESRD: PC continued as inpatient by nephrology. I appreciate their assistance. Patient reports completing PD 03/10/20. Continue phosphate binder, fluid-restriction, renally-dose medications.  Myoclonia: Patient reports progressive worsening over several months, now limiting her ability to perform ADLs. Evaluated by neurology in hospital last month, had unremarkable MRI brain, EEG wnl, normal B12. Gabapentin recently reduced to renal dosing; Xanax possibly contributing, tapering down.     Anemia: Hemoglobin appears stable. No bleeding observed.    Anxiety: Continue Xanax for now but possibly contributing to her neurologic issues, will taper down dosing.  I have seen and examined this patient myself. I have spent 34 minutes in her evaluation and care.  DVT prophylaxis: sq heparin  Code Status: Full  Level of Care: Level of care: Progressive Family Communication: None available  Disposition Plan:  Patient is from: Home  Anticipated d/c is to: Home Anticipated d/c date is: 03/13/20 Patient currently: Pending BP-control, improvement in headache, therapy assessment     Artie Mcintyre, DO [facility] Direct contact: see [email or web address]  7PM-7AM contact night coverage as above 03/12/2020, 4:22 PM  LOS: 1 day

## 2020-03-12 NOTE — Evaluation (Signed)
Physical Therapy Evaluation Patient Details Name: Patricia [name] MRN: [id number] DOB: [birth year] Today's Date: 03/12/2020   History of Present Illness  Pt is a 45 y/o female admitted 3/7 secondary to increased myoclonus, headache and chest pain. Found to be in hypertensive crisis. PMH includes ESRD on peritoneal dialysis, HTN, myoclonus, CVA, anxiety, and chronic anemia.  Clinical Impression  Pt admitted secondary to problem above with deficits below. Pt with increased myoclonic jerking throughout face, BUE and BLEs. Pt requiring mod A to sit at EOB. Further mobility unsafe to attempt with +1 from higher stretcher height. If pt progresses well and myoclonic jerking improves, may be able to progress to going home with HHPT. However, if pt does not improve, will likely require post acute rehab. Will continue to follow acutely and update recommendations based on pt progression.     Follow Up Recommendations Supervision/Assistance - 24 hour (TBD pending mobility progression)    Equipment Recommendations  Wheelchair cushion (measurements PT);Wheelchair (measurements PT)    Recommendations for Other Services       Precautions / Restrictions Precautions Precautions: Fall Precaution Comments: myoclonic Restrictions Weight Bearing Restrictions: No      Mobility  Bed Mobility Overal bed mobility: Needs Assistance Bed Mobility: Supine to Sit;Sit to Supine     Supine to sit: Mod assist Sit to supine: Mod assist   General bed mobility comments: Assist for trunk assist. Pt with increased myoclonic jerking and heavily reliant on UE support. Further mobility unsafe to attempt with +1 assist.    Transfers                    Ambulation/Gait                Stairs            Wheelchair Mobility    Modified Rankin (Stroke Patients Only)       Balance Overall balance assessment: Needs assistance Sitting-balance support: Bilateral upper extremity  supported Sitting balance-Leahy Scale: Poor Sitting balance - Comments: Heavy reliance on BUE support and at least min A.                                     Pertinent Vitals/Pain Pain Assessment: Faces Faces Pain Scale: Hurts little more Pain Location: back Pain Descriptors / Indicators: Aching Pain Intervention(s): Limited activity within patient's tolerance;Monitored during session;Repositioned    Home Living Family/patient expects to be discharged to:: Private residence Living Arrangements: Children Available Help at Discharge: Family;Available 24 hours/day Type of Home: House Home Access: Level entry     Home Layout: Two level Home Equipment: None      Prior Function Level of Independence: Independent               Hand Dominance        Extremity/Trunk Assessment   Upper Extremity Assessment Upper Extremity Assessment: Defer to OT evaluation;LUE deficits/detail;RUE deficits/detail RUE Deficits / Details: Myoclonic jerking throughout BUE with movement. Decreased coordination with finger to nose test. LUE Deficits / Details: Myoclonic jerking throughout BUE with movement. Decreased coordination with finger to nose test.    Lower Extremity Assessment Lower Extremity Assessment: RLE deficits/detail;LLE deficits/detail RLE Deficits / Details: Increased myoclonic jerking with AROM. RLE Coordination: decreased fine motor;decreased gross motor LLE Deficits / Details: Increased myoclonic jerking with AROM. LLE Coordination: decreased gross motor;decreased fine motor    Cervical / Trunk Assessment Cervical /  Trunk Assessment: Other exceptions Cervical / Trunk Exceptions: myoclonic movements of face noted throughout.  Communication   Communication: No difficulties  Cognition Arousal/Alertness: Awake/alert Behavior During Therapy: WFL for tasks assessed/performed Overall Cognitive Status: No family/caregiver present to determine baseline cognitive  functioning                                        General Comments      Exercises     Assessment/Plan    PT Assessment Patient needs continued PT services  PT Problem List Decreased strength;Decreased activity tolerance;Decreased balance;Decreased mobility;Decreased coordination;Decreased knowledge of precautions       PT Treatment Interventions DME instruction;Gait training;Functional mobility training;Therapeutic activities;Balance training;Therapeutic exercise;Stair training;Neuromuscular re-education;Patient/family education    PT Goals (Current goals can be found in the Care Plan section)  Acute Rehab PT Goals Patient Stated Goal: to feel better PT Goal Formulation: With patient Time For Goal Achievement: 03/26/20 Potential to Achieve Goals: Good    Frequency Min 3X/week   Barriers to discharge        Co-evaluation               AM-PAC PT "6 Clicks" Mobility  Outcome Measure Help needed turning from your back to your side while in a flat bed without using bedrails?: A Lot Help needed moving from lying on your back to sitting on the side of a flat bed without using bedrails?: A Lot Help needed moving to and from a bed to a chair (including a wheelchair)?: A Lot Help needed standing up from a chair using your arms (e.g., wheelchair or bedside chair)?: A Lot Help needed to walk in hospital room?: Total Help needed climbing 3-5 steps with a railing? : Total 6 Click Score: 10    End of Session   Activity Tolerance: Patient tolerated treatment well Patient left: in bed;with call bell/phone within reach (on stretcher in ED) Nurse Communication: Mobility status PT Visit Diagnosis: Unsteadiness on feet (R26.81);Muscle weakness (generalized) (M62.81);Other symptoms and signs involving the nervous system (E76.147)    Time: 0929-5747 PT Time Calculation (min) (ACUTE ONLY): 10 min   Charges:   PT Evaluation $PT Eval Moderate Complexity: 1 Mod           Patricia [name], PT, DPT  Acute Rehabilitation Services  Pager: [phone] Office: [phone]   Rudean Hitt 03/12/2020, 10:33 AM

## 2020-03-13 ENCOUNTER — Ambulatory Visit: Payer: Medicare Other | Admitting: Gastroenterology

## 2020-03-13 ENCOUNTER — Inpatient Hospital Stay (HOSPITAL_COMMUNITY): Payer: Medicare Other

## 2020-03-13 DIAGNOSIS — Z992 Dependence on renal dialysis: Secondary | ICD-10-CM | POA: Diagnosis not present

## 2020-03-13 DIAGNOSIS — E440 Moderate protein-calorie malnutrition: Secondary | ICD-10-CM

## 2020-03-13 DIAGNOSIS — N186 End stage renal disease: Secondary | ICD-10-CM | POA: Diagnosis not present

## 2020-03-13 HISTORY — PX: IR FLUORO GUIDE CV LINE LEFT: IMG2282

## 2020-03-13 HISTORY — PX: IR US GUIDE VASC ACCESS LEFT: IMG2389

## 2020-03-13 LAB — RENAL FUNCTION PANEL
Albumin: 2.9 g/dL — ABNORMAL LOW (ref 3.5–5.0)
Anion gap: 17 — ABNORMAL HIGH (ref 5–15)
BUN: 37 mg/dL — ABNORMAL HIGH (ref 6–20)
CO2: 25 mmol/L (ref 22–32)
Calcium: 9.5 mg/dL (ref 8.9–10.3)
Chloride: 92 mmol/L — ABNORMAL LOW (ref 98–111)
Creatinine, Ser: 12.28 mg/dL — ABNORMAL HIGH (ref 0.44–1.00)
GFR, Estimated: 3 mL/min — ABNORMAL LOW (ref >60)
Glucose, Bld: 137 mg/dL — ABNORMAL HIGH (ref 70–99)
Phosphorus: 7 mg/dL — ABNORMAL HIGH (ref 2.5–4.6)
Potassium: 3.2 mmol/L — ABNORMAL LOW (ref 3.5–5.1)
Sodium: 134 mmol/L — ABNORMAL LOW (ref 135–145)

## 2020-03-13 LAB — CBC
HCT: 23.5 % — ABNORMAL LOW (ref 36.0–46.0)
Hemoglobin: 7.5 g/dL — ABNORMAL LOW (ref 12.0–15.0)
MCH: 27.8 pg (ref 26.0–34.0)
MCHC: 31.9 g/dL (ref 30.0–36.0)
MCV: 87 fL (ref 80.0–100.0)
Platelets: 308 10*3/uL (ref 150–400)
RBC: 2.7 MIL/uL — ABNORMAL LOW (ref 3.87–5.11)
RDW: 15.7 % — ABNORMAL HIGH (ref 11.5–15.5)
WBC: 8 10*3/uL (ref 4.0–10.5)
nRBC: 0 % (ref 0.0–0.2)

## 2020-03-13 LAB — IRON AND TIBC
Iron: 47 ug/dL (ref 28–170)
Saturation Ratios: 21 % (ref 10.4–31.8)
TIBC: 220 ug/dL — ABNORMAL LOW (ref 250–450)
UIBC: 173 ug/dL

## 2020-03-13 LAB — FERRITIN: Ferritin: 1355 ng/mL — ABNORMAL HIGH (ref 11–307)

## 2020-03-13 MED ORDER — HEPARIN SODIUM (PORCINE) 1000 UNIT/ML IJ SOLN
INTRAMUSCULAR | Status: AC | PRN
  Administered 2020-03-13: 2.8 mL

## 2020-03-13 MED ORDER — CEFAZOLIN SODIUM-DEXTROSE 2-4 GM/100ML-% IV SOLN
INTRAVENOUS | Status: AC
  Administered 2020-03-13: 2000 mg
  Filled 2020-03-13: qty 100

## 2020-03-13 MED ORDER — SODIUM CHLORIDE 0.9 % IV SOLN: 100.0000 mg | INTRAVENOUS | Status: DC

## 2020-03-13 MED ORDER — SODIUM CHLORIDE 0.9 % IV SOLN
INTRAVENOUS | Status: AC | PRN
Start: 2020-03-13 — End: 2020-03-13
  Administered 2020-03-13: 10 mL/h via INTRAVENOUS

## 2020-03-13 MED ORDER — HEPARIN SODIUM (PORCINE) 1000 UNIT/ML IJ SOLN
INTRAMUSCULAR | Status: AC
  Filled 2020-03-13: qty 1

## 2020-03-13 MED ORDER — RENA-VITE PO TABS
1.0000 | ORAL_TABLET | Freq: Every day | ORAL | Status: DC
  Administered 2020-03-13 – 2020-03-18 (×6): 1 via ORAL
  Filled 2020-03-13 (×6): qty 1

## 2020-03-13 MED ORDER — LISINOPRIL 20 MG PO TABS
20.0000 mg | ORAL_TABLET | Freq: Every day | ORAL | Status: DC
  Administered 2020-03-13: 20 mg via ORAL
  Filled 2020-03-13: qty 1

## 2020-03-13 MED ORDER — MIDAZOLAM HCL 2 MG/2ML IJ SOLN
INTRAMUSCULAR | Status: AC
  Filled 2020-03-13: qty 2

## 2020-03-13 MED ORDER — SUCRALFATE 1 GM/10ML PO SUSP
1.0000 g | Freq: Three times a day (TID) | ORAL | Status: DC
  Administered 2020-03-13 – 2020-03-19 (×21): 1 g via ORAL
  Filled 2020-03-13 (×20): qty 10

## 2020-03-13 MED ORDER — LIDOCAINE HCL 1 % IJ SOLN
INTRAMUSCULAR | Status: AC | PRN
Start: 2020-03-13 — End: 2020-03-13
  Administered 2020-03-13: 5 mL via INTRADERMAL

## 2020-03-13 MED ORDER — CHLORHEXIDINE GLUCONATE CLOTH 2 % EX PADS
6.0000 | MEDICATED_PAD | Freq: Every day | CUTANEOUS | Status: DC
  Administered 2020-03-14 – 2020-03-19 (×5): 6 via TOPICAL

## 2020-03-13 MED ORDER — MIDAZOLAM HCL 2 MG/2ML IJ SOLN
INTRAMUSCULAR | Status: AC | PRN
  Administered 2020-03-13: 1 mg via INTRAVENOUS

## 2020-03-13 MED ORDER — PANTOPRAZOLE SODIUM 40 MG PO TBEC
40.0000 mg | DELAYED_RELEASE_TABLET | Freq: Two times a day (BID) | ORAL | Status: DC
  Administered 2020-03-13 – 2020-03-19 (×11): 40 mg via ORAL
  Filled 2020-03-13 (×13): qty 1

## 2020-03-13 MED ORDER — POTASSIUM CHLORIDE CRYS ER 20 MEQ PO TBCR
20.0000 meq | EXTENDED_RELEASE_TABLET | Freq: Once | ORAL | Status: AC
  Administered 2020-03-13: 20 meq via ORAL
  Filled 2020-03-13: qty 1

## 2020-03-13 MED ORDER — NEPRO/CARBSTEADY PO LIQD: 237.0000 mL | Freq: Two times a day (BID) | ORAL | Status: DC

## 2020-03-13 MED ORDER — METOPROLOL TARTRATE 5 MG/5ML IV SOLN
INTRAVENOUS | Status: AC | PRN
  Administered 2020-03-13: 5 mg via INTRAVENOUS

## 2020-03-13 MED ORDER — SODIUM CHLORIDE 0.9 % IV SOLN
100.0000 mg | INTRAVENOUS | Status: AC
  Administered 2020-03-13 – 2020-03-15 (×2): 100 mg via INTRAVENOUS
  Filled 2020-03-13 (×2): qty 5

## 2020-03-13 MED ORDER — LANTHANUM CARBONATE 500 MG PO CHEW
1500.0000 mg | CHEWABLE_TABLET | Freq: Three times a day (TID) | ORAL | Status: DC
  Administered 2020-03-13 – 2020-03-19 (×15): 1500 mg via ORAL
  Filled 2020-03-13 (×20): qty 3

## 2020-03-13 MED ORDER — METOPROLOL TARTRATE 5 MG/5ML IV SOLN
INTRAVENOUS | Status: AC
  Filled 2020-03-13: qty 5

## 2020-03-13 MED ORDER — MUPIROCIN 2 % EX OINT
1.0000 "application " | TOPICAL_OINTMENT | Freq: Two times a day (BID) | CUTANEOUS | Status: AC
  Administered 2020-03-14 – 2020-03-18 (×8): 1 via NASAL
  Filled 2020-03-13 (×4): qty 22

## 2020-03-13 MED ORDER — HEPARIN SODIUM (PORCINE) 1000 UNIT/ML IJ SOLN
INTRAMUSCULAR | Status: AC
  Filled 2020-03-13: qty 4

## 2020-03-13 MED ORDER — METOPROLOL TARTRATE 100 MG PO TABS
100.0000 mg | ORAL_TABLET | Freq: Two times a day (BID) | ORAL | Status: DC
  Administered 2020-03-13 – 2020-03-19 (×11): 100 mg via ORAL
  Filled 2020-03-13 (×11): qty 1
  Filled 2020-03-13: qty 2

## 2020-03-13 MED ORDER — LISINOPRIL 20 MG PO TABS: 20.0000 mg | ORAL_TABLET | Freq: Every day | ORAL | Status: DC

## 2020-03-13 MED ORDER — FENTANYL CITRATE (PF) 100 MCG/2ML IJ SOLN
INTRAMUSCULAR | Status: AC | PRN
  Administered 2020-03-13 (×2): 50 ug via INTRAVENOUS

## 2020-03-13 MED ORDER — LIDOCAINE HCL 1 % IJ SOLN
INTRAMUSCULAR | Status: AC
  Filled 2020-03-13: qty 20

## 2020-03-13 MED ORDER — LIDOCAINE HCL 1 % IJ SOLN
INTRAMUSCULAR | Status: AC | PRN
  Administered 2020-03-13: 5 mL via INTRADERMAL

## 2020-03-13 MED ORDER — FENTANYL CITRATE (PF) 100 MCG/2ML IJ SOLN
INTRAMUSCULAR | Status: AC
  Filled 2020-03-13: qty 2

## 2020-03-13 MED ORDER — NEPRO/CARBSTEADY PO LIQD
237.0000 mL | Freq: Three times a day (TID) | ORAL | Status: DC
  Administered 2020-03-14 – 2020-03-19 (×12): 237 mL via ORAL

## 2020-03-13 NOTE — Progress Notes (Signed)
Upper extremity vein mapping has been completed.   Preliminary results in CV Proc.   Patricia [name] 03/13/2020 1:22 PM

## 2020-03-13 NOTE — Procedures (Signed)
Interventional Radiology Procedure Note  Procedure: Tunneled HD catheter placement, LEFT IJ 23 cm  Complications: None  Estimated Blood Loss: None  Recommendations: - May begin dialysis - Routine line care    Signed,  Criselda Peaches, MD

## 2020-03-13 NOTE — Progress Notes (Addendum)
Initial Nutrition Assessment  DOCUMENTATION CODES:  Non-severe (moderate) malnutrition in context of chronic illness  INTERVENTION:  Continue Nepro Shake po TID, each supplement provides 425 kcal and 19 grams protein.  Continue Rena-Vite daily.  NUTRITION DIAGNOSIS:  Moderate Malnutrition related to chronic illness (ESRD) as evidenced by mild muscle depletion,mild fat depletion,energy intake < or equal to 50% for > or equal to 1 month.  GOAL:  Patient will meet greater than or equal to 90% of their needs  MONITOR:  PO intake,Supplement acceptance,Labs,Weight trends,I & O's  REASON FOR ASSESSMENT:  Malnutrition Screening Tool   ASSESSMENT:  45 yo female with a PMH of ESRD on PD, HTN, anxiety, chronic anemia, and myoclonia who presents with headache, chest pain, and increased shaking.   Patricia [name] starting iHD.  Spoke with Patricia [name] at bedside right after [facility] procedure. Patricia [name] very groggy and tired. She reports eating 1 meal per day consisting of a vegetable, rice, and baked chicken. She is not taking supplements at home, but does receive them at dialysis. She reports that she has not had much of an appetite for a while now, but the recent symptoms had started about 3-4 weeks ago. She lives with her daughter and daughter is primary grocery shopper/cook in the home.  As for weight hx, she takes off about 2-3 liters of fluid. When she went to dialysis, they were not able to pull off any fluid. Her dry weight is unknown. It is also noted that she does not get around very well at home and she reports seeing weight loss in her legs. Noted decreased muscle in legs during NFPE.  Encouraged PO, supplement, and renal MVI intake to promote caloric, protein, and vitamin/mineral intake.  Relevant Medications: heparin 5000 units TID, Rena-Vite, Protonix, KCl 20 mEq, Fosrenol TID, fentanyl, versed, zofran Labs: reviewed; Na 134, K 3.2, Glucose 137, BUN 37, Crt 12.28, Phos 7, Corrected Ca 10.5  NUTRITION - FOCUSED  PHYSICAL EXAM: Flowsheet Row Most Recent Value  Orbital Region Mild depletion  Upper Arm Region No depletion  Thoracic and Lumbar Region No depletion  Buccal Region Mild depletion  Temple Region Mild depletion  Clavicle Bone Region No depletion  Clavicle and Acromion Bone Region No depletion  Scapular Bone Region No depletion  Dorsal Hand No depletion  Patellar Region Moderate depletion  Anterior Thigh Region Moderate depletion  Posterior Calf Region Moderate depletion  Edema (RD Assessment) None  Hair Reviewed  Eyes Reviewed  Mouth Reviewed  [white film on tongue]  Skin Reviewed  Nails Reviewed     Diet Order:   Diet Order            Diet renal with fluid restriction Fluid restriction: 1200 mL Fluid; Room service appropriate? Yes; Fluid consistency: Thin  Diet effective now                EDUCATION NEEDS:  Education needs have been addressed  Skin:  Skin Assessment: Reviewed RN Assessment  Last BM:  03/12/2020  Height:  Ht Readings from Last 1 Encounters:  02/14/20 $RemoveB'5\' 7"'yFeDaHOK$  (1.702 m)   Weight:  Wt Readings from Last 1 Encounters:  03/13/20 104.5 kg   Ideal Body Weight:  61.4 kg  BMI:  Body mass index is 36.08 kg/m.  Estimated Nutritional Needs:  Kcal:  2200-2400 Protein:  95-110 grams Fluid:  1200 ml (fluid restriction)  Derrel Nip, RD, LDN Registered Dietitian After Hours/Weekend Pager # in [place]

## 2020-03-13 NOTE — Consult Note (Signed)
Patient Status: [facility] - In-pt  Assessment and Plan: History of ESRD on PD with chest pain and myoclonic jerking (?probable under dialyze with PD), in need of HD access for conversion to HD.  Plan for image-guided tunneled HD catheter placement in IR tentatively for today pending IR scheduling. Patient is NPO. Afebrile.  Risks and benefits discussed with the patient including, but not limited to bleeding, infection, vascular injury, pneumothorax which may require chest tube placement, air embolism or even death. All of the patient's questions were answered, patient is agreeable to proceed. Consent signed and in IR control room.  ______________________________________________________________________   History of Present Illness: Patricia [name] is a 45 y.o. female with a past medical history of hypertension, high cholesterol, CVA 2019, GERD, GI bleed 2019, ESRD (was on HD and transitioned to PD, on PD on admission), iron deficiency anemia, gout, arthritis, and anxiety. She presented to [facility] ED 03/11/2020 with complaints of headaches, chest pain, and body shaking. She was admitted for further management. Nephrology was consulted. Patient was started on ASA, acetaminophen, labetalol, and nitroglycerin infusion. She was continued on PD for additional night to see if improvement, however symptoms persist.  IR consulted by Juanell Fairly, NP for possible image-guided tunneled HD catheter placement. Patient awake and alert laying in bed. Accompanied by daughter via speaker phone. Denies fever, chills, chest pain, dyspnea, or abdominal pain.   Allergies and medications reviewed.   Review of Systems: A 12 point ROS discussed and pertinent positives are indicated in the HPI above.  All other systems are negative.  Review of Systems  Constitutional: Negative for chills and fever.  Respiratory: Negative for shortness of breath and wheezing.   Cardiovascular: Negative for chest pain and  palpitations.  Gastrointestinal: Negative for abdominal pain.  Psychiatric/Behavioral: Negative for behavioral problems and confusion.    Vital Signs: BP (!) 165/84   Pulse 73   Temp 98.1 F (36.7 C) (Oral)   Resp (!) 21   Wt 230 lb 6.1 oz (104.5 kg)   SpO2 91%   BMI 36.08 kg/m   Physical Exam Vitals and nursing note reviewed.  Constitutional:      General: She is not in acute distress. Cardiovascular:     Rate and Rhythm: Normal rate and regular rhythm.     Heart sounds: Normal heart sounds. No murmur heard.   Pulmonary:     Effort: Pulmonary effort is normal. No respiratory distress.     Breath sounds: Normal breath sounds. No wheezing.  Musculoskeletal:     Comments: (+) generalized myoclonus jerking.  Skin:    General: Skin is warm and dry.  Neurological:     Mental Status: She is alert and oriented to person, place, and time.      Imaging reviewed.   Labs:  COAGS: Recent Labs    05/11/19 0515  INR 1.1    BMP: Recent Labs    05/11/19 0515 05/12/19 0647 05/13/19 0537 05/14/19 2254 02/05/20 1850 02/16/20 0048 03/11/20 1430 03/12/20 0446 03/13/20 0357  NA 132* 132* 136 138   < > 136 136 136 134*  K 6.2* 5.6* 5.3* 4.6   < > 3.9 3.8 3.6 3.2*  CL 90* 91* 98 104   < > 98 96* 96* 92*  CO2 $Re'26 26 24 22   'OAl$ < > $R'22 22 23 25  'ED$ GLUCOSE 162* 99 96 116*   < > 96 107* 104* 137*  BUN 26* 53* 28* 58*   < > 13  32* 38* 37*  CALCIUM 9.5 9.0 9.0 9.1   < > 10.4* 10.4* 10.0 9.5  CREATININE 7.19* 10.40* 6.58* 11.49*   < > 9.56* 11.80* 12.85* 12.28*  GFRNONAA 6* 4* 7* 4*   < > 5* 4* 3* 3*  GFRAA 7* 5* 8* 4*  --   --   --   --   --    < > = values in this interval not displayed.       Electronically Signed: Earley Abide, PA-C 03/13/2020, 10:22 AM   I spent a total of 25 minutes in face to face in clinical consultation, greater than 50% of which was counseling/coordinating care for HD access.

## 2020-03-13 NOTE — Progress Notes (Signed)
[facility] Progress Note   Subjective: Seen in room, persistent myoclonus type movements. Still on NTG 200 mcg IV which probably explains her HA. No chest pain. SCr remains 12.28 after PD last PM. Will contact IR to place [facility] and resume HD. Consult VVS for permanent access.   Objective Vitals:   03/13/20 0800 03/13/20 0819 03/13/20 0900 03/13/20 0929  BP: (!) 167/78 (!) 167/78 (!) 165/84   Pulse:  73    Resp: (!) 22  (!) 21   Temp:  98.3 F (36.8 C)  98.1 F (36.7 C)  TempSrc:  Oral  Oral  SpO2: 95%  91%   Weight:    104.5 kg   Physical Exam General::Obese female in no acute distress Heart: S1,S2 RRR. SR on monitor.  Lungs: CTAB Abdomen: Obese, active BS. PD catheter intact.  Extremities: No LE edema  Dialysis Access: PD catheter, drsg CDI.     Additional Objective Labs: Basic Metabolic Panel: Recent Labs  Lab 03/11/20 1430 03/12/20 0446 03/13/20 0357  NA 136 136 134*  K 3.8 3.6 3.2*  CL 96* 96* 92*  CO2 $Re'22 23 25  'IID$ GLUCOSE 107* 104* 137*  BUN 32* 38* 37*  CREATININE 11.80* 12.85* 12.28*  CALCIUM 10.4* 10.0 9.5  PHOS  --   --  7.0*   Liver Function Tests: Recent Labs  Lab 03/13/20 0357  ALBUMIN 2.9*   No results for input(s): LIPASE, AMYLASE in the last 168 hours. CBC: Recent Labs  Lab 03/11/20 1430 03/12/20 0446 03/13/20 0357  WBC 7.6 7.2 8.0  HGB 8.9* 7.6* 7.5*  HCT 28.5* 23.8* 23.5*  MCV 89.9 88.5 87.0  PLT 303 256 308   Blood Culture    Component Value Date/Time   SDES BLOOD RIGHT HAND 08/23/2018 2120   SPECREQUEST  08/23/2018 2120    BOTTLES DRAWN AEROBIC ONLY Blood Culture adequate volume   CULT  08/23/2018 2120    NO GROWTH 5 DAYS Performed at [facility], [place] [address], [place], [place] [address]    REPTSTATUS 08/28/2018 FINAL 08/23/2018 2120    Cardiac Enzymes: No results for input(s): CKTOTAL, CKMB, CKMBINDEX, TROPONINI in the last 168 hours. CBG: No results for input(s): GLUCAP in the last 168  hours. Iron Studies:  Recent Labs    03/13/20 0357  IRON 47  TIBC 220*  FERRITIN 1,355*   '@lablastinr3'$ @ Studies/Results: DG Chest 2 View  Result Date: 03/11/2020 CLINICAL DATA:  Left-sided chest pain, shortness of breath, weakness, and lightheadedness for 1 week. EXAM: CHEST - 2 VIEW COMPARISON:  Radiograph 10/03/2019, additional priors FINDINGS: Mild chronic eventration of right hemidiaphragm. Heart size upper normal. The cardiomediastinal contours are normal. Prominence of the right paratracheal stripe is likely relating to overlapping vascular structures, and has been intermittently present on radiographs. The lungs are clear. Pulmonary vasculature is normal. No consolidation, pleural effusion, or pneumothorax. No acute osseous abnormalities are seen. IMPRESSION: Borderline cardiomegaly.  No acute pulmonary process. Electronically Signed   By: Keith Rake M.D.   On: 03/11/2020 15:14   CT Head Wo Contrast  Result Date: 03/11/2020 CLINICAL DATA:  Headache and dizziness for 3 days. EXAM: CT HEAD WITHOUT CONTRAST TECHNIQUE: Contiguous axial images were obtained from the base of the skull through the vertex without intravenous contrast. COMPARISON:  Head CT 05/12/2019 and MRI 02/09/2020 FINDINGS: Brain: There is no evidence of an acute infarct, intracranial hemorrhage, mass, midline shift, or extra-axial fluid collection. The ventricles and sulci are normal. Vascular: Calcified atherosclerosis  at the skull base. No hyperdense vessel. Skull: No fracture or suspicious osseous lesion. Sinuses/Orbits: Visualized paranasal sinuses and mastoid air cells are clear. Unremarkable orbits. Other: None. IMPRESSION: Unremarkable CT appearance of the brain. Electronically Signed   By: Logan Bores M.D.   On: 03/11/2020 17:53   Medications: . sodium chloride    . dialysis solution 2.5% low-MG/low-CA    . dialysis solution 4.25% low-MG/low-CA    . nitroGLYCERIN 200 mcg/min (03/13/20 0459)   . allopurinol   100 mg Oral QHS  . atorvastatin  40 mg Oral q1800  . Chlorhexidine Gluconate Cloth  6 each Topical Daily  . gentamicin ointment  1 application Topical Daily  . heparin  5,000 Units Subcutaneous Q8H  . hydrALAZINE  50 mg Oral Q8H  . lanthanum  1,000 mg Oral TID with meals  . metoprolol tartrate  50 mg Oral BID  . mupirocin ointment  1 application Nasal BID  . pantoprazole  40 mg Oral Daily     Assessment/Plan: 1.  Hypertensive Crisis: Multifactorial. Probably a combination of under dialysis, mild-moderate volume overload and medical noncompliance. Has been on hydralazine as monotherapy and only taking once daily.  Medication rec here does not match medication list at OP center. Resumed metoprolol at 50 mg PO BID 03/12/20 will increase to OP dose. Will start Lisinopril 20 mg q HS. Still on NTG gtt @ 200 mcg/min. . Down-titrate and DC when possible.  Continue hydralazine at 50 mg PO Q 8 hours. 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 SCr remains in 12 range with large volume dianeal solution. Will consult IR to place [facility] today and resume HD. Consult VVS for permanent access.  3. Chest pain: Has been on hydralazine without BB. Resumed metoprolol. Vasodilator without BB may precipitate chest pain. Troponins low with flat trend. No further chest pain today.  Per primary.  4.  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 Prior work up per neurology last month. Agree with renal dosing gabapentin, can continue benzodiazepine at current dose. Will resume HD.  5. Hypokalemia: Unfortunately no 4.0 K baths available. Give KCL 20 MEQ PO X 1 dose today.  6.  ESRD - Recent return to PD (03/03/2020) after being placed on hemodialysis for for two weeks D/T poor clearance (Last HD 02/25/20220). Resume HD as soon as TDC in place.  7.  Hypertension/volume  - See #1.  8.  Anemia  -HGB 7.5. Last ESA Mircera 225 mcg 03/11/2020. Fe 47 Tsat 21. Ferritin elevated but will load with Fe here.   Follow HGB.  9.  Metabolic bone disease - Calcium 10.4. Not on VDRA. Continue binders. PO4 elevated-7.0. Increase fosrenol to 1.5g PO TID AC.  10.  Nutrition - Renal diet with fld restrictions. Add nepro, renal vits.  11.    Medical Noncompliance-ongoing history of not taking medications as directed. Discussed at length with her daughter. Needs assistance with compliance with home medications.   Pernie Grosso H.  C[place]ack NP-C 03/13/2020, 9:52 AM  [employer] [phone]

## 2020-03-13 NOTE — Progress Notes (Signed)
PT Cancellation Note  Patient Details Name: Mellie Buccellato MRN: [id number] DOB: 05/15/1975   Cancelled Treatment:    Reason Eval/Treat Not Completed: (P) Patient declined, no reason specified (pt recently returned from [place] lab and has headache, RN gave pain meds, pt defer PT session. Plan also for HD soon so will re-attempt next date per PT POC as schedule permits.)   Carlene Coria 03/13/2020, 2:27 PM

## 2020-03-13 NOTE — Progress Notes (Signed)
OT Cancellation Note  Patient Details Name: Patricia [name] MRN: [id number] DOB: 1975/04/05   Cancelled Treatment:    Reason Eval/Treat Not Completed: Patient declined, no reason specified (Pt with headache that eased after medication, but pt going to HD soon and asked "can we do this tomorrow?" OT to continue to follow.)   Jefferey Pica, OTR/L [place] Pager: [phone] Office: [place] C 03/13/2020, 3:56 PM

## 2020-03-13 NOTE — H&P (View-Only) (Signed)
Hospital Consult    Reason for Consult:  Permanent hemodialysis access Requesting Physician:  Juanell Fairly ,NP MRN #:  [id number]   History of Present Illness: This is a 45 y.o. female presented to [facility] ED  On 03/11/2020 with complaints of headache, chest pain, and increased shaking movements. Onset was following PD at home the prior evening. She has a history of ESRD with recent return to PD (03/03/2020) after being placed on hemodialysis for for two weeks due to poor clearance (Last HD 02/25/20220).  Current issues include hypertensive crisis and myoclonus.  Arrangements are being made for placement of TDC by IR today in order to resume HD. We are asked to evaluate the patient for permanent hemodialysis access.  Currently denies pain or SOB. She is right handed. She recently had right IJ TDC removed. No history of pacemaker  Surgical history includes left radiocephalic AVF, left brachiocepahlic AVF with subsequent superficialization and ultimately thrombosed in 2018.  She had PD catheter placed and we were asked to place HD access as back-up. She underwent AVG placement in 2019 which was ligated and removed secondary to steal syndrome.  The pt is on a statin for cholesterol management.  The pt not on a daily aspirin.   Other AC:  none The pt is  on outpatient hydralazine for hypertension.  (history of non-compliance) The pt is not diabetic.   Tobacco hx: never  Past Medical History:  Diagnosis Date  . Acid reflux   . Anemia 04/2019   REQUIRING BLOOD TRANSFUSIONS  . Anxiety   . Arthritis   . Dyspnea   . Dysrhythmia    tachycardia  . ESRD (end stage renal disease) ([place])    [facility]- [place]  . GI bleeding 12/2017  . Gout   . Headache(784.0)    "related to chemo; sometimes weekly" (09/12/2013)  . High cholesterol   . History of blood transfusion "a couple"   "related to low counts"  . Hypertension   . Iron deficiency anemia    "get epogen shots q month" (02/20/2014)  . MCGN  (minimal change glomerulonephritis)    "using chemo to tx;  finished my last tx in 12/2013"  . Peritoneal dialysis status ([place])   . Stroke [facility]) 08/15/2017    Past Surgical History:  Procedure Laterality Date  . ABDOMINAL HYSTERECTOMY  2010   "laparoscopic"  . ANKLE FRACTURE SURGERY Right 1994  . AV FISTULA PLACEMENT Left 04/14/2016   Procedure: ARTERIOVENOUS (AV) FISTULA CREATION;  Surgeon: Angelia Mould, MD;  Location: [facility] OR;  Service: Vascular;  Laterality: Left;  . AV FISTULA PLACEMENT Left 08/09/2017   Procedure: INSERTION OF ARTERIOVENOUS (AV) GORE-TEX GRAFT LEFT ARM;  Surgeon: Elam Dutch, MD;  Location: [place];  Service: Vascular;  Laterality: Left;  . [place] REMOVAL Left 08/11/2017   Procedure: REMOVAL OF ARTERIOVENOUS GORETEX GRAFT ([place]);  Surgeon: Serafina Mitchell, MD;  Location: [facility] OR;  Service: Vascular;  Laterality: Left;  . BIOPSY  09/03/2017   Procedure: BIOPSY;  Surgeon: Otis Brace, MD;  Location: [place];  Service: Gastroenterology;;  . BIOPSY  12/24/2017   Procedure: BIOPSY;  Surgeon: Irving Copas., MD;  Location: [place];  Service: Gastroenterology;;  . CARDIAC CATHETERIZATION  2000's  . COLONOSCOPY WITH PROPOFOL N/A 09/04/2017   Procedure: COLONOSCOPY WITH PROPOFOL;  Surgeon: Ronnette Juniper, MD;  Location: [place];  Service: Gastroenterology;  Laterality: N/A;  . ENTEROSCOPY N/A 12/24/2017   Procedure: ENTEROSCOPY;  Surgeon: Rush Landmark Telford Nab., MD;  Location:  [place] ENDOSCOPY;  Service: Gastroenterology;  Laterality: N/A;  . ESOPHAGOGASTRODUODENOSCOPY    . ESOPHAGOGASTRODUODENOSCOPY (EGD) WITH PROPOFOL Left 06/04/2017   Procedure: ESOPHAGOGASTRODUODENOSCOPY (EGD) WITH PROPOFOL;  Surgeon: Arta Silence, MD;  Location: [place];  Service: Endoscopy;  Laterality: Left;  . ESOPHAGOGASTRODUODENOSCOPY (EGD) WITH PROPOFOL N/A 09/03/2017   Procedure: ESOPHAGOGASTRODUODENOSCOPY (EGD) WITH PROPOFOL;  Surgeon: Otis Brace, MD;   Location: [place];  Service: Gastroenterology;  Laterality: N/A;  . FISTULA SUPERFICIALIZATION Left 04/02/2017   Procedure: FISTULA SUPERFICIALIZATION LEFT ARM;  Surgeon: Serafina Mitchell, MD;  Location: [place];  Service: Vascular;  Laterality: Left;  . FISTULOGRAM Left 04/07/2016   Procedure: FISTULOGRAM;  Surgeon: Waynetta Sandy, MD;  Location: [place];  Service: Vascular;  Laterality: Left;  . FRACTURE SURGERY     right ankle  . GIVENS CAPSULE STUDY N/A 10/29/2017   Procedure: GIVENS CAPSULE STUDY;  Surgeon: Wilford Corner, MD;  Location: [place];  Service: Endoscopy;  Laterality: N/A;  . GIVENS CAPSULE STUDY N/A 12/24/2017   Procedure: GIVENS CAPSULE STUDY;  Surgeon: Irving Copas., MD;  Location: [place];  Service: Gastroenterology;  Laterality: N/A;  . GIVENS CAPSULE STUDY N/A 05/22/2018   Procedure: GIVENS CAPSULE STUDY;  Surgeon: Irving Copas., MD;  Location: [place];  Service: Gastroenterology;  Laterality: N/A;  . INSERTION OF DIALYSIS CATHETER Right 04/07/2016   Procedure: INSERTION OF DIALYSIS CATHETER;  Surgeon: Waynetta Sandy, MD;  Location: [place];  Service: Vascular;  Laterality: Right;  . INSERTION OF DIALYSIS CATHETER Right 04/04/2017   Procedure: INSERTION OF DIALYSIS CATHETER;  Surgeon: Waynetta Sandy, MD;  Location: [place];  Service: Vascular;  Laterality: Right;  . LEFT HEART CATH AND CORONARY ANGIOGRAPHY N/A 05/12/2019   Procedure: LEFT HEART CATH AND CORONARY ANGIOGRAPHY;  Surgeon: Dixie Dials, MD;  Location: [place] CV LAB;  Service: Cardiovascular;  Laterality: N/A;  . LIGATION OF ARTERIOVENOUS  FISTULA Left 04/14/2016   Procedure: LIGATION OF ARTERIOVENOUS  FISTULA;  Surgeon: Angelia Mould, MD;  Location: [place];  Service: Vascular;  Laterality: Left;  . PATCH ANGIOPLASTY Left 06/19/2016   Procedure: PATCH ANGIOPLASTY LEFT ARTERIOVENOUS FISTULA;  Surgeon: Angelia Mould, MD;  Location: [place];  Service: Vascular;  Laterality: Left;  . POLYPECTOMY  09/04/2017   Procedure: POLYPECTOMY;  Surgeon: Ronnette Juniper, MD;  Location: [facility] ENDOSCOPY;  Service: Gastroenterology;;  . REVISON OF ARTERIOVENOUS FISTULA Left 06/19/2016   Procedure: REVISION SUPERFICIALIZATION OF BRACHIOCEPHALIC ARTERIOVENOUS FISTULA;  Surgeon: Angelia Mould, MD;  Location: [place];  Service: Vascular;  Laterality: Left;    Allergies  Allergen Reactions  . Nsaids Other (See Comments)    Cannot take due to Kidney disease/Kidney function   . Tolmetin Other (See Comments)    Cannot take due to Kidney Disease    Prior to Admission medications   Medication Sig Start Date End Date Taking? Authorizing Provider  allopurinol (ZYLOPRIM) 100 MG tablet Take 100 mg by mouth at bedtime.    Yes [provider]  ALPRAZolam Duanne Moron) 1 MG tablet Take 1 mg by mouth 2 (two) times daily as needed for anxiety.  03/19/17  Yes [provider]  atorvastatin (LIPITOR) 40 MG tablet Take 1 tablet (40 mg total) by mouth daily at 6 PM. 05/13/19  Yes Dixie Dials, MD  gabapentin (NEURONTIN) 100 MG capsule Take 100 mg by mouth at bedtime. 02/28/20  Yes [provider]  hydrALAZINE (APRESOLINE) 50 MG tablet Take 50 mg by mouth at bedtime. 10/06/19  Yes [provider]  hydrOXYzine (ATARAX/VISTARIL) 25 MG tablet Take 25 mg by mouth every 6 (six) hours as needed for itching. 08/16/19  Yes [provider]  Lanthanum Carbonate (FOSRENOL) 1000 MG PACK Take 1 packet by mouth with breakfast, with lunch, and with evening meal. 03/04/20  Yes [provider]  multivitamin (RENA-VIT) TABS tablet Take 1 tablet by mouth at bedtime.    Yes [provider]  omeprazole (PRILOSEC) 40 MG capsule Take 40 mg by mouth at bedtime.   Yes [provider]  promethazine (PHENERGAN) 25 MG tablet Take 1 tablet (25 mg total) by mouth every 8 (eight) hours as needed for nausea or vomiting. 04/07/18  Yes  Enid Derry, [place], DO  zolpidem (AMBIEN) 5 MG tablet Take 1 tablet (5 mg total) by mouth at bedtime as needed for sleep. 02/16/20  Yes Lavina Hamman, MD  calcitRIOL (ROCALTROL) 0.5 MCG capsule Take 1 capsule (0.5 mcg total) by mouth daily with breakfast. Patient not taking: No sig reported 06/04/17   Bonnielee Haff, MD  ferric citrate (AURYXIA) 1 GM 210 MG(Fe) tablet Take 3 tablets (630 mg total) by mouth 3 (three) times daily with meals. Patient not taking: Reported on 03/11/2020 04/28/19   Elodia Florence., MD  saccharomyces boulardii (FLORASTOR) 250 MG capsule Take 1 capsule (250 mg total) by mouth 2 (two) times daily. Patient not taking: No sig reported 02/16/20   Lavina Hamman, MD    Social History   Socioeconomic History  . Marital status: Single    Spouse name: Not on file  . Number of children: 3  . Years of education: 17  . Highest education level: Associate degree: academic program  Occupational History  . Occupation: disabled  Tobacco Use  . Smoking status: Never Smoker  . Smokeless tobacco: Never Used  Vaping Use  . Vaping Use: Never used  Substance and Sexual Activity  . Alcohol use: No  . Drug use: No  . Sexual activity: Not Currently    Birth control/protection: Surgical  Other Topics Concern  . Not on file  Social History Narrative  . Not on file   Social Determinants of Health   Financial Resource Strain: Not on file  Food Insecurity: Not on file  Transportation Needs: Not on file  Physical Activity: Not on file  Stress: Not on file  Social Connections: Not on file  Intimate Partner Violence: Not on file     Family History  Problem Relation Age of Onset  . Hypertension Mother   . Thyroid disease Mother   . Coronary artery disease Father   . Hypertension Father   . Diabetes Father   . Colon cancer Maternal Aunt   . Esophageal cancer Maternal Uncle   . Inflammatory bowel disease Neg Hx   . Liver disease Neg Hx   . Pancreatic cancer Neg Hx    . Rectal cancer Neg Hx   . Stomach cancer Neg Hx     ROS: [x]  Positive   [ ]  Negative   [ ]  All sytems reviewed and are negative  Cardiac: []  chest pain/pressure []  palpitations []  SOB lying flat []  DOE  Vascular: []  pain in legs while walking []  pain in legs at rest []  pain in legs at night []  non-healing ulcers []  hx of DVT []  swelling in legs  Pulmonary: []  productive cough []  asthma/wheezing []  home O2  Neurologic: [x]  weakness in []  arms []  legs []  numbness in []  arms []  legs []   hx of CVA $Remo'[]'Nbdjy$  mini stroke $RemoveBef'[]'VYnGtWnsNW$ difficulty speaking or slurred speech $RemoveBefore'[]'xFTgUtKMSrJgj$  temporary loss of vision in one eye $Remov'[]'lAYudX$  dizziness  Hematologic: $RemoveBefor'[]'zcahdYTvGgOs$  hx of cancer $RemoveB'[]'AgFobPlG$  bleeding problems $RemoveBeforeDEI'[]'SAooFxXMcdRAYuMc$  problems with blood clotting easily  Endocrine:   '[]'$  diabetes $RemoveB'[]'addxiKfD$  thyroid disease  GI $R'[]'HS$  vomiting blood $RemoveBefore'[]'xFMetykhFnWPb$  blood in stool  GU: $Re'[x]'Ptr$  CKD/renal failure $RemoveBeforeDEI'[]'IArxtoOfcgTkmJHx$  HD'[]'$  M/W/F or $Remove'[]'EYEDxtW$  T/T/S $Remo'[]'cwmBu$  burning with urination $RemoveBefore'[]'eigxEnjAltqGw$  blood in urine  Psychiatric: $RemoveBefor'[]'fpxbdnzGsifC$  anxiety $Remove'[]'CNITFdr$  depression  Musculoskeletal: $RemoveBeforeDEI'[]'VKXlefurNpBtXtaM$  arthritis $RemoveBe'[]'ukgDBSpeP$  joint pain  Integumentary: $RemoveBeforeD'[]'jKLMOYkDySDAuP$  rashes $Remov'[]'KhWRvd$  ulcers  Constitutional: $RemoveBeforeDE'[]'gqsGbgWjJWVfWhV$  fever $Remo'[]'GXoNG$  chills   Physical Examination  Vitals:   03/13/20 0900 03/13/20 0929  BP: (!) 165/84   Pulse:    Resp: (!) 21   Temp:  98.1 F (36.7 C)  SpO2: 91%    Body mass index is 36.08 kg/m.  General:  WDWN in NAD Gait: Not observed HENT: WNL, normocephalic Pulmonary: normal non-labored breathing, without Rales, rhonchi,  wheezing Cardiac: RRR Abdomen:  soft, NT/ND, no masses Skin: without rashes Extremities: without ischemic changes, without Gangrene , without cellulitis; without open wounds;  RUE: 1+ radial pulse. PIV times two in place. 3/5 grip strength with myoclonus. Left radial pulse 3+. 3+ bil DP pulses Musculoskeletal: no muscle wasting or atrophy  Neurologic: A&O X 3;  No focal weakness or paresthesias are detected; speech is fluent/normal. Myoclonic muscle twitches of face and hands with intention Psychiatric:  The  pt has Normal affect.    CBC    Component Value Date/Time   WBC 8.0 03/13/2020 0357   RBC 2.70 (L) 03/13/2020 0357   HGB 7.5 (L) 03/13/2020 0357   HCT 23.5 (L) 03/13/2020 0357   HCT 27.5 (L) 04/05/2018 0943   PLT 308 03/13/2020 0357   MCV 87.0 03/13/2020 0357   MCH 27.8 03/13/2020 0357   MCHC 31.9 03/13/2020 0357   RDW 15.7 (H) 03/13/2020 0357   LYMPHSABS 2.3 02/16/2020 0048   MONOABS 1.0 02/16/2020 0048   EOSABS 0.2 02/16/2020 0048   BASOSABS 0.1 02/16/2020 0048    BMET    Component Value Date/Time   NA 134 (L) 03/13/2020 0357   K 3.2 (L) 03/13/2020 0357   CL 92 (L) 03/13/2020 0357   CO2 25 03/13/2020 0357   GLUCOSE 137 (H) 03/13/2020 0357   BUN 37 (H) 03/13/2020 0357   CREATININE 12.28 (H) 03/13/2020 0357   CALCIUM 9.5 03/13/2020 0357   GFRNONAA 3 (L) 03/13/2020 0357   GFRAA 4 (L) 05/14/2019 2254    COAGS: Lab Results  Component Value Date   INR 1.1 05/11/2019   INR 1.07 02/26/2018   INR 0.98 12/22/2017    ASSESSMENT/PLAN: This is a 45 y.o. female with poor clearance from peritoneal dialysis and needs hemodialysis. TDC planned for today. Failed multiple accesses in the past in LUE. 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 No acute blood loss. - Needs PIVs moved from RUE and extremity alert bracelet placed. Arrange right upper extremity vein mapping.  Dr. Trula Slade, on-cal vascular surgeon, will evaluate and provide further treatment plans.  Risa Grill, PA-C [facility] [phone]  I agree with the above.  I have seen and evaluated the patient.  This is a 45 year old female in need of dialysis access.  Interventional radiology has placed the catheter.  She has a history of a left arm fistula and when that failed a left upper arm graft.  She had to have her graft removed secondary to  steal.  After reviewing her vein mapping, I believe she is a candidate for a  right brachiocephalic fistula.  We will try to get this scheduled this week, likely Friday  Wells Ahad Colarusso  +-----------------+-------------+----------+--------------+  Right Cephalic  Diameter (cm)Depth (cm)  Findings    +-----------------+-------------+----------+--------------+  Shoulder       0.36     1.65           +-----------------+-------------+----------+--------------+  Prox upper arm    0.40     2.01           +-----------------+-------------+----------+--------------+  Mid upper arm    0.47     1.27           +-----------------+-------------+----------+--------------+  Dist upper arm    0.29     1.45           +-----------------+-------------+----------+--------------+  Antecubital fossa  0.44     0.85   branching    +-----------------+-------------+----------+--------------+  Prox forearm               not visualized  +-----------------+-------------+----------+--------------+  Mid forearm               not visualized  +-----------------+-------------+----------+--------------+  Dist forearm               not visualized  +-----------------+-------------+----------+--------------+  Wrist                  not visualized  +-----------------+-------------+----------+--------------+   +-----------------+-------------+----------+--------------+  Right Basilic  Diameter (cm)Depth (cm)  Findings    +-----------------+-------------+----------+--------------+  Prox upper arm    0.33     1.86           +-----------------+-------------+----------+--------------+  Mid upper arm    0.37     1.87           +-----------------+-------------+----------+--------------+  Dist upper arm    0.33     1.84            +-----------------+-------------+----------+--------------+  Antecubital fossa  0.43     1.48           +-----------------+-------------+----------+--------------+  Prox forearm     0.30     0.41           +-----------------+-------------+----------+--------------+  Mid forearm     0.27     0.35           +-----------------+-------------+----------+--------------+  Distal forearm              not visualized  +-----------------+-------------+----------+--------------+  Elbow                  not visualized  +-----------------+-------------+----------+--------------+  Wrist                  not visualized  +-----------------+-------------+----------+--------------+

## 2020-03-13 NOTE — Consult Note (Addendum)
Hospital Consult    Reason for Consult:  Permanent hemodialysis access Requesting Physician:  Juanell Fairly ,NP MRN #:  [id number]   History of Present Illness: This is a 45 y.o. female presented to [facility] ED  On 03/11/2020 with complaints of headache, chest pain, and increased shaking movements. Onset was following PD at home the prior evening. She has a history of ESRD with recent return to PD (03/03/2020) after being placed on hemodialysis for for two weeks due to poor clearance (Last HD 02/25/20220).  Current issues include hypertensive crisis and myoclonus.  Arrangements are being made for placement of TDC by IR today in order to resume HD. We are asked to evaluate the patient for permanent hemodialysis access.  Currently denies pain or SOB. She is right handed. She recently had right IJ TDC removed. No history of pacemaker  Surgical history includes left radiocephalic AVF, left brachiocepahlic AVF with subsequent superficialization and ultimately thrombosed in 2018.  She had PD catheter placed and we were asked to place HD access as back-up. She underwent AVG placement in 2019 which was ligated and removed secondary to steal syndrome.  The pt is on a statin for cholesterol management.  The pt not on a daily aspirin.   Other AC:  none The pt is  on outpatient hydralazine for hypertension.  (history of non-compliance) The pt is not diabetic.   Tobacco hx: never  Past Medical History:  Diagnosis Date  . Acid reflux   . Anemia 04/2019   REQUIRING BLOOD TRANSFUSIONS  . Anxiety   . Arthritis   . Dyspnea   . Dysrhythmia    tachycardia  . ESRD (end stage renal disease) ([place])    [facility]THSAT- [place]  . GI bleeding 12/2017  . Gout   . Headache(784.0)    "related to chemo; sometimes weekly" (09/12/2013)  . High cholesterol   . History of blood transfusion "a couple"   "related to low counts"  . Hypertension   . Iron deficiency anemia    "get epogen shots q month" (02/20/2014)  . MCGN  (minimal change glomerulonephritis)    "using chemo to tx;  finished my last tx in 12/2013"  . Peritoneal dialysis status ([place])   . Stroke [facility]) 08/15/2017    Past Surgical History:  Procedure Laterality Date  . ABDOMINAL HYSTERECTOMY  2010   "laparoscopic"  . ANKLE FRACTURE SURGERY Right 1994  . AV FISTULA PLACEMENT Left 04/14/2016   Procedure: ARTERIOVENOUS (AV) FISTULA CREATION;  Surgeon: Angelia Mould, MD;  Location: [facility] OR;  Service: Vascular;  Laterality: Left;  . AV FISTULA PLACEMENT Left 08/09/2017   Procedure: INSERTION OF ARTERIOVENOUS (AV) GORE-TEX GRAFT LEFT ARM;  Surgeon: Elam Dutch, MD;  Location: [place];  Service: Vascular;  Laterality: Left;  . [place] REMOVAL Left 08/11/2017   Procedure: REMOVAL OF ARTERIOVENOUS GORETEX GRAFT ([place]);  Surgeon: Serafina Mitchell, MD;  Location: [facility] OR;  Service: Vascular;  Laterality: Left;  . BIOPSY  09/03/2017   Procedure: BIOPSY;  Surgeon: Otis Brace, MD;  Location: [place];  Service: Gastroenterology;;  . BIOPSY  12/24/2017   Procedure: BIOPSY;  Surgeon: Irving Copas., MD;  Location: [place];  Service: Gastroenterology;;  . CARDIAC CATHETERIZATION  2000's  . COLONOSCOPY WITH PROPOFOL N/A 09/04/2017   Procedure: COLONOSCOPY WITH PROPOFOL;  Surgeon: Ronnette Juniper, MD;  Location: [place];  Service: Gastroenterology;  Laterality: N/A;  . ENTEROSCOPY N/A 12/24/2017   Procedure: ENTEROSCOPY;  Surgeon: Rush Landmark Telford Nab., MD;  Location:  [place] END[facility]COPY;  Service: Gastroenterology;  Laterality: N/A;  . ESOPHAGOGASTRODUODENOSCOPY    . ESOPHAGOGASTRODUODENOSCOPY (EGD) WITH PROPOFOL Left 06/04/2017   Procedure: ESOPHAGOGASTRODUODENOSCOPY (EGD) WITH PROPOFOL;  Surgeon: Arta Silence, MD;  Location: [place];  Service: Endoscopy;  Laterality: Left;  . ESOPHAGOGASTRODUODENOSCOPY (EGD) WITH PROPOFOL N/A 09/03/2017   Procedure: ESOPHAGOGASTRODUODENOSCOPY (EGD) WITH PROPOFOL;  Surgeon: Otis Brace, MD;   Location: [place];  Service: Gastroenterology;  Laterality: N/A;  . FISTULA SUPERFICIALIZATION Left 04/02/2017   Procedure: FISTULA SUPERFICIALIZATION LEFT ARM;  Surgeon: Serafina Mitchell, MD;  Location: [place];  Service: Vascular;  Laterality: Left;  . FISTULOGRAM Left 04/07/2016   Procedure: FISTULOGRAM;  Surgeon: Waynetta Sandy, MD;  Location: [place];  Service: Vascular;  Laterality: Left;  . FRACTURE SURGERY     right ankle  . GIVENS CAPSULE STUDY N/A 10/29/2017   Procedure: GIVENS CAPSULE STUDY;  Surgeon: Wilford Corner, MD;  Location: [place];  Service: Endoscopy;  Laterality: N/A;  . GIVENS CAPSULE STUDY N/A 12/24/2017   Procedure: GIVENS CAPSULE STUDY;  Surgeon: Irving Copas., MD;  Location: [place];  Service: Gastroenterology;  Laterality: N/A;  . GIVENS CAPSULE STUDY N/A 05/22/2018   Procedure: GIVENS CAPSULE STUDY;  Surgeon: Irving Copas., MD;  Location: [place];  Service: Gastroenterology;  Laterality: N/A;  . INSERTION OF DIALYSIS CATHETER Right 04/07/2016   Procedure: INSERTION OF DIALYSIS CATHETER;  Surgeon: Waynetta Sandy, MD;  Location: [place];  Service: Vascular;  Laterality: Right;  . INSERTION OF DIALYSIS CATHETER Right 04/04/2017   Procedure: INSERTION OF DIALYSIS CATHETER;  Surgeon: Waynetta Sandy, MD;  Location: [place];  Service: Vascular;  Laterality: Right;  . LEFT HEART CATH AND CORONARY ANGIOGRAPHY N/A 05/12/2019   Procedure: LEFT HEART CATH AND CORONARY ANGIOGRAPHY;  Surgeon: Dixie Dials, MD;  Location: [place] CV LAB;  Service: Cardiovascular;  Laterality: N/A;  . LIGATION OF ARTERIOVENOUS  FISTULA Left 04/14/2016   Procedure: LIGATION OF ARTERIOVENOUS  FISTULA;  Surgeon: Angelia Mould, MD;  Location: [place];  Service: Vascular;  Laterality: Left;  . PATCH ANGIOPLASTY Left 06/19/2016   Procedure: PATCH ANGIOPLASTY LEFT ARTERIOVENOUS FISTULA;  Surgeon: Angelia Mould, MD;  Location: [place];  Service: Vascular;  Laterality: Left;  . POLYPECTOMY  09/04/2017   Procedure: POLYPECTOMY;  Surgeon: Ronnette Juniper, MD;  Location: [facility] ENDOSCOPY;  Service: Gastroenterology;;  . REVISON OF ARTERIOVENOUS FISTULA Left 06/19/2016   Procedure: REVISION SUPERFICIALIZATION OF BRACHIOCEPHALIC ARTERIOVENOUS FISTULA;  Surgeon: Angelia Mould, MD;  Location: [place];  Service: Vascular;  Laterality: Left;    Allergies  Allergen Reactions  . Nsaids Other (See Comments)    Cannot take due to Kidney disease/Kidney function   . Tolmetin Other (See Comments)    Cannot take due to Kidney Disease    Prior to Admission medications   Medication Sig Start Date End Date Taking? Authorizing Provider  allopurinol (ZYLOPRIM) 100 MG tablet Take 100 mg by mouth at bedtime.    Yes [provider]  ALPRAZolam Duanne Moron) 1 MG tablet Take 1 mg by mouth 2 (two) times daily as needed for anxiety.  03/19/17  Yes [provider]  atorvastatin (LIPITOR) 40 MG tablet Take 1 tablet (40 mg total) by mouth daily at 6 PM. 05/13/19  Yes Dixie Dials, MD  gabapentin (NEURONTIN) 100 MG capsule Take 100 mg by mouth at bedtime. 02/28/20  Yes [provider]  hydrALAZINE (APRESOLINE) 50 MG tablet Take 50 mg by mouth at bedtime. 10/06/19  Yes [provider]  hydrOXYzine (ATARAX/VISTARIL) 25 MG tablet Take 25 mg by mouth every 6 (six) hours as needed for itching. 08/16/19  Yes [provider]  Lanthanum Carbonate (FOSRENOL) 1000 MG PACK Take 1 packet by mouth with breakfast, with lunch, and with evening meal. 03/04/20  Yes [provider]  multivitamin (RENA-VIT) TABS tablet Take 1 tablet by mouth at bedtime.    Yes [provider]  omeprazole (PRILOSEC) 40 MG capsule Take 40 mg by mouth at bedtime.   Yes [provider]  promethazine (PHENERGAN) 25 MG tablet Take 1 tablet (25 mg total) by mouth every 8 (eight) hours as needed for nausea or vomiting. 04/07/18  Yes  Enid Derry, [place], DO  zolpidem (AMBIEN) 5 MG tablet Take 1 tablet (5 mg total) by mouth at bedtime as needed for sleep. 02/16/20  Yes Lavina Hamman, MD  calcitRIOL (ROCALTROL) 0.5 MCG capsule Take 1 capsule (0.5 mcg total) by mouth daily with breakfast. Patient not taking: No sig reported 06/04/17   Bonnielee Haff, MD  ferric citrate (AURYXIA) 1 GM 210 MG(Fe) tablet Take 3 tablets (630 mg total) by mouth 3 (three) times daily with meals. Patient not taking: Reported on 03/11/2020 04/28/19   Elodia Florence., MD  saccharomyces boulardii (FLORASTOR) 250 MG capsule Take 1 capsule (250 mg total) by mouth 2 (two) times daily. Patient not taking: No sig reported 02/16/20   Lavina Hamman, MD    Social History   Socioeconomic History  . Marital status: Single    Spouse name: Not on file  . Number of children: 3  . Years of education: 66  . Highest education level: Associate degree: academic program  Occupational History  . Occupation: disabled  Tobacco Use  . Smoking status: Never Smoker  . Smokeless tobacco: Never Used  Vaping Use  . Vaping Use: Never used  Substance and Sexual Activity  . Alcohol use: No  . Drug use: No  . Sexual activity: Not Currently    Birth control/protection: Surgical  Other Topics Concern  . Not on file  Social History Narrative  . Not on file   Social Determinants of Health   Financial Resource Strain: Not on file  Food Insecurity: Not on file  Transportation Needs: Not on file  Physical Activity: Not on file  Stress: Not on file  Social Connections: Not on file  Intimate Partner Violence: Not on file     Family History  Problem Relation Age of Onset  . Hypertension Mother   . Thyroid disease Mother   . Coronary artery disease Father   . Hypertension Father   . Diabetes Father   . Colon cancer Maternal Aunt   . Esophageal cancer Maternal Uncle   . Inflammatory bowel disease Neg Hx   . Liver disease Neg Hx   . Pancreatic cancer Neg Hx    . Rectal cancer Neg Hx   . Stomach cancer Neg Hx     ROS: [x]  Positive   [ ]  Negative   [ ]  All sytems reviewed and are negative  Cardiac: []  chest pain/pressure []  palpitations []  SOB lying flat []  DOE  Vascular: []  pain in legs while walking []  pain in legs at rest []  pain in legs at night []  non-healing ulcers []  hx of DVT []  swelling in legs  Pulmonary: []  productive cough []  asthma/wheezing []  home O2  Neurologic: [x]  weakness in []  arms []  legs []  numbness in []  arms []  legs []   hx of CVA $Remo'[]'eGWEt$  mini stroke $RemoveBef'[]'hQDQQIghrt$ difficulty speaking or slurred speech $RemoveBefore'[]'WBLzOBFUpndlB$  temporary loss of vision in one eye $Remov'[]'UXhUDl$  dizziness  Hematologic: $RemoveBefor'[]'aSsuDPJECaMZ$  hx of cancer $RemoveB'[]'VIDKCIZv$  bleeding problems $RemoveBeforeDEI'[]'kMdXpgtSxaiWXHtK$  problems with blood clotting easily  Endocrine:   '[]'$  diabetes $RemoveB'[]'VemlBYms$  thyroid disease  GI $R'[]'uL$  vomiting blood $RemoveBefore'[]'aMRawnWnMfmxL$  blood in stool  GU: $Re'[x]'iyj$  CKD/renal failure $RemoveBeforeDEI'[]'RsOPbNhCWQGqhmkP$  HD'[]'$  M/W/F or $Remove'[]'vKbGgae$  T/T/S $Remo'[]'OpGZL$  burning with urination $RemoveBefore'[]'FQxayfMjRCfZq$  blood in urine  Psychiatric: $RemoveBefor'[]'wKfNiMDECOZp$  anxiety $Remove'[]'rFCTNXp$  depression  Musculoskeletal: $RemoveBeforeDEI'[]'DuZOytKZZVqfHHfW$  arthritis $RemoveBe'[]'cVYqcWHcu$  joint pain  Integumentary: $RemoveBeforeD'[]'cRTXSUkBwFiWUy$  rashes $Remov'[]'VRCgsA$  ulcers  Constitutional: $RemoveBeforeDE'[]'XOrooGzNkLcWYtm$  fever $Remo'[]'KXJfA$  chills   Physical Examination  Vitals:   03/13/20 0900 03/13/20 0929  BP: (!) 165/84   Pulse:    Resp: (!) 21   Temp:  98.1 F (36.7 C)  SpO2: 91%    Body mass index is 36.08 kg/m.  General:  WDWN in NAD Gait: Not observed HENT: WNL, normocephalic Pulmonary: normal non-labored breathing, without Rales, rhonchi,  wheezing Cardiac: RRR Abdomen:  soft, NT/ND, no masses Skin: without rashes Extremities: without ischemic changes, without Gangrene , without cellulitis; without open wounds;  RUE: 1+ radial pulse. PIV times two in place. 3/5 grip strength with myoclonus. Left radial pulse 3+. 3+ bil DP pulses Musculoskeletal: no muscle wasting or atrophy  Neurologic: A&O X 3;  No focal weakness or paresthesias are detected; speech is fluent/normal. Myoclonic muscle twitches of face and hands with intention Psychiatric:  The  pt has Normal affect.    CBC    Component Value Date/Time   WBC 8.0 03/13/2020 0357   RBC 2.70 (L) 03/13/2020 0357   HGB 7.5 (L) 03/13/2020 0357   HCT 23.5 (L) 03/13/2020 0357   HCT 27.5 (L) 04/05/2018 0943   PLT 308 03/13/2020 0357   MCV 87.0 03/13/2020 0357   MCH 27.8 03/13/2020 0357   MCHC 31.9 03/13/2020 0357   RDW 15.7 (H) 03/13/2020 0357   LYMPHSABS 2.3 02/16/2020 0048   MONOABS 1.0 02/16/2020 0048   EOSABS 0.2 02/16/2020 0048   BASOSABS 0.1 02/16/2020 0048    BMET    Component Value Date/Time   NA 134 (L) 03/13/2020 0357   K 3.2 (L) 03/13/2020 0357   CL 92 (L) 03/13/2020 0357   CO2 25 03/13/2020 0357   GLUCOSE 137 (H) 03/13/2020 0357   BUN 37 (H) 03/13/2020 0357   CREATININE 12.28 (H) 03/13/2020 0357   CALCIUM 9.5 03/13/2020 0357   GFRNONAA 3 (L) 03/13/2020 0357   GFRAA 4 (L) 05/14/2019 2254    COAGS: Lab Results  Component Value Date   INR 1.1 05/11/2019   INR 1.07 02/26/2018   INR 0.98 12/22/2017    ASSESSMENT/PLAN: This is a 45 y.o. female with poor clearance from peritoneal dialysis and needs hemodialysis. TDC planned for today. Failed multiple accesses in the past in LUE. 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 No acute blood loss. - Needs PIVs moved from RUE and extremity alert bracelet placed. Arrange right upper extremity vein mapping.  Dr. Trula Slade, on-cal vascular surgeon, will evaluate and provide further treatment plans.  Risa Grill, PA-C [facility] [phone]  I agree with the above.  I have seen and evaluated the patient.  This is a 45 year old female in need of dialysis access.  Interventional radiology has placed the catheter.  She has a history of a left arm fistula and when that failed a left upper arm graft.  She had to have her graft removed secondary to  steal.  After reviewing her vein mapping, I believe she is a candidate for a  right brachiocephalic fistula.  We will try to get this scheduled this week, likely Friday  [place]   +-----------------+-------------+----------+--------------+  Right Cephalic  Diameter (cm)Depth (cm)  Findings    +-----------------+-------------+----------+--------------+  Shoulder       0.36     1.65           +-----------------+-------------+----------+--------------+  Prox upper arm    0.40     2.01           +-----------------+-------------+----------+--------------+  Mid upper arm    0.47     1.27           +-----------------+-------------+----------+--------------+  Dist upper arm    0.29     1.45           +-----------------+-------------+----------+--------------+  Antecubital fossa  0.44     0.85   branching    +-----------------+-------------+----------+--------------+  Prox forearm               not visualized  +-----------------+-------------+----------+--------------+  Mid forearm               not visualized  +-----------------+-------------+----------+--------------+  Dist forearm               not visualized  +-----------------+-------------+----------+--------------+  Wrist                  not visualized  +-----------------+-------------+----------+--------------+   +-----------------+-------------+----------+--------------+  Right Basilic  Diameter (cm)Depth (cm)  Findings    +-----------------+-------------+----------+--------------+  Prox upper arm    0.33     1.86           +-----------------+-------------+----------+--------------+  Mid upper arm    0.37     1.87           +-----------------+-------------+----------+--------------+  Dist upper arm    0.33     1.84            +-----------------+-------------+----------+--------------+  Antecubital fossa  0.43     1.48           +-----------------+-------------+----------+--------------+  Prox forearm     0.30     0.41           +-----------------+-------------+----------+--------------+  Mid forearm     0.27     0.35           +-----------------+-------------+----------+--------------+  Distal forearm              not visualized  +-----------------+-------------+----------+--------------+  Elbow                  not visualized  +-----------------+-------------+----------+--------------+  Wrist                  not visualized  +-----------------+-------------+----------+--------------+

## 2020-03-13 NOTE — Progress Notes (Signed)
PROGRESS NOTE    Patricia [name]  PJS:[id number] DOB: 1975-02-14 DOA: 03/11/2020 PCP: Patricia Askew, PA-C    Brief Narrative:  Mrs. Hands was admitted to the hospital working diagnosis of hypertensive emergency.  45 year old female past medical history for end-stage renal disease on peritoneal dialysis, hypertension, chronic anemia, myoclonia and anxiety who presented with headache, chest pain and increased jerking movements.  At home patient developed constant chest pain, nonradiating, associated myoclonus and headaches.  On her initial physical examination blood pressure 210/82, heart rate 62, respirate 23, oxygen saturation 97%, her lungs were clear to auscultation bilaterally, heart S1-S2, present rhythmic, her abdomen was nondistended, no lower extremity edema.  She had rapid periocular muscle twitching and eye deviation.  Positive myoclonia.  Patient was placed on nitroglycerin drip with improvement of blood pressure.  Dose of alprazolam was reduced.  Peritoneal dialysis was continued during her hospital stay, but continue patient to have myoclonic movements, considered to be uremic in nature.  03/09 patient underwent tunneled HD catheter placement left IJ.  Assessment & Plan:   Principal Problem:   Hypertensive crisis without congestive heart failure Active Problems:   Normocytic anemia   ESRD on peritoneal dialysis (HCC)   Chest pain   Myoclonia   Malnutrition of moderate degree   1. Hypertensive emergency. Blood pressure 132/92 and 164/97 today.  Patient has been on nitroglycerin drip. Positive headache.   She has not received her oral blood pressure medications yet, metoprolol 100 mg po bid and lisinopril 20 mg daily. Continue with hydralazine 50 mg tid.  Plan for patient to get utlrafiltration today.  Discontinue nitroglycerin drip, give lisinopril 20 mg now. Continue with as needed labetalol.   2. ESRD on PD. Patient with persistent hypervolemia, she  continue to have myoclonic movements, that likely are exacerbated by uremia.  Patient had a HD tunneled catheter placed today and plan to resume hemodialysis.   3. Anemia of chronic renal disease. Hgb and Hct stable, continue close follow up on cell count.   4. Anxiety. Continue with alprazolam.   5. Myoclonia. Outpatient workup as outpatient has been negative.  6. Moderate calorie protein malnutrition. Continue with nutritional supplementation.   7. Dyslipidemia. Continue with atorvastatin.   Patient continue to be at high risk for worsening uremia.   Status is: Inpatient  Remains inpatient appropriate because:IV treatments appropriate due to intensity of illness or inability to take PO   Dispo: The patient is from: Home              Anticipated d/c is to: Home              Patient currently is not medically stable to d/c.   Difficult to place patient No   DVT prophylaxis: Heparin   Code Status:   full  Family Communication:  No family at the bedside      Nutrition Status: Nutrition Problem: Moderate Malnutrition Etiology: chronic illness (ESRD) Signs/Symptoms: mild muscle depletion,mild fat depletion,energy intake < or equal to 50% for > or equal to 1 month Interventions: MVI,Nepro shake     Consultants:   Nephrology   IR   Procedures:   HD cathter left IJ       Subjective: Patient continue to have headaches, severe in intensity, positive myoclonic movement, more on the face and head. No nausea or vomiting, no chest pain.   Objective: Vitals:   03/13/20 1140 03/13/20 1145 03/13/20 1150 03/13/20 1210  BP: (!) 173/84 126/84 (!) 132/92 Marland Kitchen)  164/97  Pulse: 77 84 (!) 160 82  Resp: $Remo'14 15 16 16  'LLyDC$ Temp:      TempSrc:      SpO2: 100% 96% 97% 94%  Weight:        Intake/Output Summary (Last 24 hours) at 03/13/2020 1427 Last data filed at 03/13/2020 0900 Gross per 24 hour  Intake 480 ml  Output --  Net 480 ml   Filed Weights   03/13/20 0502 03/13/20 0929   Weight: 101.4 kg 104.5 kg    Examination:   General: Not in pain or dyspnea, deconditioned  Neurology: Awake and alert, positive facial myoclonic movements,  E ENT: no pallor, no icterus, oral mucosa moist Cardiovascular: No JVD. S1-S2 present, rhythmic, no gallops, rubs, or murmurs. No lower extremity edema. Pulmonary: positive breath sounds bilaterally, no wheezing Gastrointestinal. Abdomen protuberant,  Skin. No rashes Musculoskeletal: no joint deformities     Data Reviewed: I have personally reviewed following labs and imaging studies  CBC: Recent Labs  Lab 03/11/20 1430 03/12/20 0446 03/13/20 0357  WBC 7.6 7.2 8.0  HGB 8.9* 7.6* 7.5*  HCT 28.5* 23.8* 23.5*  MCV 89.9 88.5 87.0  PLT 303 256 212   Basic Metabolic Panel: Recent Labs  Lab 03/11/20 1430 03/12/20 0446 03/13/20 0357  NA 136 136 134*  K 3.8 3.6 3.2*  CL 96* 96* 92*  CO2 $Re'22 23 25  'yyG$ GLUCOSE 107* 104* 137*  BUN 32* 38* 37*  CREATININE 11.80* 12.85* 12.28*  CALCIUM 10.4* 10.0 9.5  PHOS  --   --  7.0*   GFR: Estimated Creatinine Clearance: 7.1 mL/min (A) (by C-G formula based on SCr of 12.28 mg/dL (H)). Liver Function Tests: Recent Labs  Lab 03/13/20 0357  ALBUMIN 2.9*   No results for input(s): LIPASE, AMYLASE in the last 168 hours. No results for input(s): AMMONIA in the last 168 hours. Coagulation Profile: No results for input(s): INR, PROTIME in the last 168 hours. Cardiac Enzymes: No results for input(s): CKTOTAL, CKMB, CKMBINDEX, TROPONINI in the last 168 hours. BNP (last 3 results) No results for input(s): PROBNP in the last 8760 hours. HbA1C: No results for input(s): HGBA1C in the last 72 hours. CBG: No results for input(s): GLUCAP in the last 168 hours. Lipid Profile: No results for input(s): CHOL, HDL, LDLCALC, TRIG, CHOLHDL, LDLDIRECT in the last 72 hours. Thyroid Function Tests: No results for input(s): TSH, T4TOTAL, FREET4, T3FREE, THYROIDAB in the last 72 hours. Anemia  Panel: Recent Labs    03/13/20 0357  FERRITIN 1,355*  TIBC 220*  IRON 47      Radiology Studies: I have reviewed all of the imaging during this hospital visit personally     Scheduled Meds: . allopurinol  100 mg Oral QHS  . atorvastatin  40 mg Oral q1800  . Chlorhexidine Gluconate Cloth  6 each Topical Daily  . feeding supplement (NEPRO CARB STEADY)  237 mL Oral TID BM  . gentamicin ointment  1 application Topical Daily  . heparin  5,000 Units Subcutaneous Q8H  . heparin sodium (porcine)      . hydrALAZINE  50 mg Oral Q8H  . lanthanum  1,500 mg Oral TID with meals  . lidocaine      . lisinopril  20 mg Oral QHS  . metoprolol tartrate  100 mg Oral BID  . multivitamin  1 tablet Oral QHS  . mupirocin ointment  1 application Nasal BID  . pantoprazole  40 mg Oral Daily   Continuous Infusions: .  sodium chloride    . dialysis solution 2.5% low-MG/low-CA    . dialysis solution 4.25% low-MG/low-CA    . iron sucrose    . nitroGLYCERIN 30 mcg/min (03/13/20 1359)     LOS: 2 days        Nakota Elsen Gerome Apley, MD

## 2020-03-14 DIAGNOSIS — N185 Chronic kidney disease, stage 5: Secondary | ICD-10-CM

## 2020-03-14 LAB — RENAL FUNCTION PANEL
Albumin: 2.6 g/dL — ABNORMAL LOW (ref 3.5–5.0)
Anion gap: 9 (ref 5–15)
BUN: 19 mg/dL (ref 6–20)
CO2: 28 mmol/L (ref 22–32)
Calcium: 9.3 mg/dL (ref 8.9–10.3)
Chloride: 98 mmol/L (ref 98–111)
Creatinine, Ser: 8.19 mg/dL — ABNORMAL HIGH (ref 0.44–1.00)
GFR, Estimated: 6 mL/min — ABNORMAL LOW (ref >60)
Glucose, Bld: 95 mg/dL (ref 70–99)
Phosphorus: 4.8 mg/dL — ABNORMAL HIGH (ref 2.5–4.6)
Potassium: 3.7 mmol/L (ref 3.5–5.1)
Sodium: 135 mmol/L (ref 135–145)

## 2020-03-14 LAB — CBC
HCT: 24.9 % — ABNORMAL LOW (ref 36.0–46.0)
Hemoglobin: 7.8 g/dL — ABNORMAL LOW (ref 12.0–15.0)
MCH: 27.7 pg (ref 26.0–34.0)
MCHC: 31.3 g/dL (ref 30.0–36.0)
MCV: 88.3 fL (ref 80.0–100.0)
Platelets: 263 10*3/uL (ref 150–400)
RBC: 2.82 MIL/uL — ABNORMAL LOW (ref 3.87–5.11)
RDW: 15.9 % — ABNORMAL HIGH (ref 11.5–15.5)
WBC: 7 10*3/uL (ref 4.0–10.5)
nRBC: 0 % (ref 0.0–0.2)

## 2020-03-14 MED ORDER — SODIUM CHLORIDE 0.9 % IV SOLN: 100.0000 mL | INTRAVENOUS | Status: DC | PRN

## 2020-03-14 MED ORDER — AMLODIPINE BESYLATE 10 MG PO TABS
10.0000 mg | ORAL_TABLET | Freq: Every day | ORAL | Status: DC
  Administered 2020-03-14 – 2020-03-19 (×5): 10 mg via ORAL
  Filled 2020-03-14 (×5): qty 1

## 2020-03-14 MED ORDER — HEPARIN SODIUM (PORCINE) 1000 UNIT/ML DIALYSIS: 1000.0000 [IU] | INTRAMUSCULAR | Status: DC | PRN

## 2020-03-14 MED ORDER — PROMETHAZINE HCL 25 MG/ML IJ SOLN
12.5000 mg | INTRAMUSCULAR | Status: DC | PRN
  Administered 2020-03-15: 12.5 mg via INTRAVENOUS
  Filled 2020-03-14 (×2): qty 1

## 2020-03-14 MED ORDER — PENTAFLUOROPROP-TETRAFLUOROETH EX AERO: 1.0000 "application " | INHALATION_SPRAY | CUTANEOUS | Status: DC | PRN

## 2020-03-14 MED ORDER — HYDRALAZINE HCL 50 MG PO TABS
50.0000 mg | ORAL_TABLET | Freq: Once | ORAL | Status: AC
  Administered 2020-03-14: 50 mg via ORAL
  Filled 2020-03-14: qty 1

## 2020-03-14 MED ORDER — LIDOCAINE-PRILOCAINE 2.5-2.5 % EX CREA: 1.0000 "application " | TOPICAL_CREAM | CUTANEOUS | Status: DC | PRN

## 2020-03-14 MED ORDER — LISINOPRIL 20 MG PO TABS
20.0000 mg | ORAL_TABLET | Freq: Every day | ORAL | Status: DC
  Administered 2020-03-14 – 2020-03-18 (×5): 20 mg via ORAL
  Filled 2020-03-14 (×5): qty 1

## 2020-03-14 MED ORDER — ONDANSETRON HCL 4 MG/2ML IJ SOLN
INTRAMUSCULAR | Status: AC
  Filled 2020-03-14: qty 2

## 2020-03-14 MED ORDER — LIDOCAINE HCL (PF) 1 % IJ SOLN
5.0000 mL | INTRAMUSCULAR | Status: DC | PRN
Start: 2020-03-14 — End: 2020-03-14

## 2020-03-14 MED ORDER — PROSOURCE PLUS PO LIQD
30.0000 mL | Freq: Two times a day (BID) | ORAL | Status: DC
  Administered 2020-03-14 – 2020-03-19 (×9): 30 mL via ORAL
  Filled 2020-03-14 (×7): qty 30

## 2020-03-14 MED ORDER — HYDRALAZINE HCL 50 MG PO TABS
100.0000 mg | ORAL_TABLET | Freq: Three times a day (TID) | ORAL | Status: DC
Start: 2020-03-14 — End: 2020-03-19
  Administered 2020-03-14 – 2020-03-19 (×14): 100 mg via ORAL
  Filled 2020-03-14 (×14): qty 2

## 2020-03-14 MED ORDER — ALTEPLASE 2 MG IJ SOLR: 2.0000 mg | Freq: Once | INTRAMUSCULAR | Status: DC | PRN

## 2020-03-14 MED ORDER — HEPARIN SODIUM (PORCINE) 1000 UNIT/ML IJ SOLN
INTRAMUSCULAR | Status: AC
  Filled 2020-03-14: qty 1

## 2020-03-14 NOTE — Progress Notes (Signed)
PT Cancellation Note  Patient Details Name: Patricia [name] MRN: [id number] DOB: 05/21/1975   Cancelled Treatment:    Reason Eval/Treat Not Completed: Patient at procedure or test/unavailable. Pt off unit at HDU. PT will follow up as time allows.   Zenaida Niece 03/14/2020, 4:41 PM

## 2020-03-14 NOTE — Progress Notes (Addendum)
PROGRESS NOTE    Patricia [name]  KXF:[id number] DOB: [birth year] DOA: 03/11/2020 PCP: Doyle Askew, PA-C    Brief Narrative:  Patricia [name] was admitted to the hospital with the working diagnosis of hypertensive emergency in the setting of hypervolemia and uremia related to ESRD.  45 year old female past medical history for end-stage renal disease on peritoneal dialysis, hypertension, chronic anemia, myoclonia and anxiety who presented with headache, chest pain and increased jerking movements.  At home patient developed constant chest pain, nonradiating, associated myoclonus and headaches.  On her initial physical examination blood pressure 210/82, heart rate 62, respirate 23, oxygen saturation 97%, her lungs were clear to auscultation bilaterally, heart S1-S2, present rhythmic, her abdomen was nondistended, no lower extremity edema.  She had rapid periocular muscle twitching and eye deviation.  Positive myoclonia.  Patient was placed on nitroglycerin drip with improvement of blood pressure.  Dose of alprazolam was reduced.  Peritoneal dialysis was continued during her hospital stay, but continue patient to have myoclonic movements, considered to be uremic in nature.  03/09 patient underwent tunneled HD catheter placement left IJ.    Assessment & Plan:   Principal Problem:   Hypertensive crisis without congestive heart failure Active Problems:   Normocytic anemia   ESRD on peritoneal dialysis (HCC)   Chest pain   Myoclonia   Malnutrition of moderate degree   1. Hypertensive emergency. Blood pressure continue to be uncontrolled 194/105 mmHg.   Patient for HD and ultrafiltration today.  Hydralazine has been increased to 100 mg po tid, continue with lisinopril 20 mg po daily and metoprolol 100 mg po bd. Add amlodipine 10 mg daily and continue blood pressure montoring after HD and ultrafiltration.   2. ESRD on PD/ hypokalemia . Continue to have hypervolemia,  likely PD was not being effective in fluid removal and dialysis.  Continue nephrology recommendations for HD, today is second consecutive day of HD. BUN now down to 19 from 37.   Continue with fosrenol.   3. Anemia of chronic renal disease. Patient on IV iron therapy.  Hgb at 7.8 and hct at 24,9. No current indication for PRBC transfusion.   4. Anxiety. On alprazolam.   5. Myoclonic movements. Outpatient workup as outpatient has been negative. Clinically has improved with hemodialysis, but not yet resolved, continue close monitoring, possible uremic in nature.   6. Moderate calorie protein malnutrition. On nutritional supplementation.   7. Dyslipidemia. On atorvastatin.    Patient continue to be at high risk for worsening uremic symptoms and uncontrolled hypertension   Status is: Inpatient  Remains inpatient appropriate because:Inpatient level of care appropriate due to severity of illness   Dispo: The patient is from: Home              Anticipated d/c is to: Home              Patient currently is not medically stable to d/c.   Difficult to place patient No   DVT prophylaxis: Heparin   Code Status:   full  Family Communication:  No family at the bedside      Nutrition Status: Nutrition Problem: Moderate Malnutrition Etiology: chronic illness (ESRD) Signs/Symptoms: mild muscle depletion,mild fat depletion,energy intake < 75% for > or equal to 1 month Interventions: MVI,Nepro shake   Consultants:   Nephrology    Subjective: Patient continue to have elevated blood pressure, headache has improved, no nausea or vomiting, twitching movement have improved.   Objective: Vitals:   03/14/20 1200 03/14/20  1230 03/14/20 1241 03/14/20 1300  BP: (!) 219/105 (!) 200/102 (!) 202/104 (!) 189/110  Pulse:  71 71 70  Resp:  16    Temp:  99.3 F (37.4 C)    TempSrc:  Oral    SpO2:  98%    Weight:  101.1 kg      Intake/Output Summary (Last 24 hours) at 03/14/2020  1400 Last data filed at 03/14/2020 0914 Gross per 24 hour  Intake 1998.75 ml  Output 1963 ml  Net 35.75 ml   Filed Weights   03/13/20 2153 03/14/20 0351 03/14/20 1230  Weight: 102.1 kg 100.1 kg 101.1 kg    Examination:   General: Not in pain or dyspnea, deconditioned  Neurology: Awake and alert, non focal  E ENT: mild pallor, no icterus, oral mucosa moist Cardiovascular: No JVD. S1-S2 present, rhythmic, no gallops, rubs, or murmurs. No lower extremity edema. Pulmonary: positive breath sounds bilaterally, adequate air movement, no wheezing, rhonchi or rales. Gastrointestinal. Abdomen soft and non tender. PD catheter in place.  Skin. No rashes Musculoskeletal: no joint deformities     Data Reviewed: I have personally reviewed following labs and imaging studies  CBC: Recent Labs  Lab 03/11/20 1430 03/12/20 0446 03/13/20 0357 03/14/20 0657  WBC 7.6 7.2 8.0 7.0  HGB 8.9* 7.6* 7.5* 7.8*  HCT 28.5* 23.8* 23.5* 24.9*  MCV 89.9 88.5 87.0 88.3  PLT 303 256 308 818   Basic Metabolic Panel: Recent Labs  Lab 03/11/20 1430 03/12/20 0446 03/13/20 0357 03/14/20 0657  NA 136 136 134* 135  K 3.8 3.6 3.2* 3.7  CL 96* 96* 92* 98  CO2 $Re'22 23 25 28  'YTl$ GLUCOSE 107* 104* 137* 95  BUN 32* 38* 37* 19  CREATININE 11.80* 12.85* 12.28* 8.19*  CALCIUM 10.4* 10.0 9.5 9.3  PHOS  --   --  7.0* 4.8*   GFR: Estimated Creatinine Clearance: 10.5 mL/min (A) (by C-G formula based on SCr of 8.19 mg/dL (H)). Liver Function Tests: Recent Labs  Lab 03/13/20 0357 03/14/20 0657  ALBUMIN 2.9* 2.6*   No results for input(s): LIPASE, AMYLASE in the last 168 hours. No results for input(s): AMMONIA in the last 168 hours. Coagulation Profile: No results for input(s): INR, PROTIME in the last 168 hours. Cardiac Enzymes: No results for input(s): CKTOTAL, CKMB, CKMBINDEX, TROPONINI in the last 168 hours. BNP (last 3 results) No results for input(s): PROBNP in the last 8760 hours. HbA1C: No results  for input(s): HGBA1C in the last 72 hours. CBG: No results for input(s): GLUCAP in the last 168 hours. Lipid Profile: No results for input(s): CHOL, HDL, LDLCALC, TRIG, CHOLHDL, LDLDIRECT in the last 72 hours. Thyroid Function Tests: No results for input(s): TSH, T4TOTAL, FREET4, T3FREE, THYROIDAB in the last 72 hours. Anemia Panel: Recent Labs    03/13/20 0357  FERRITIN 1,355*  TIBC 220*  IRON 47      Radiology Studies: I have reviewed all of the imaging during this hospital visit personally     Scheduled Meds: . (feeding supplement) PROSource Plus  30 mL Oral BID BM  . allopurinol  100 mg Oral QHS  . atorvastatin  40 mg Oral q1800  . Chlorhexidine Gluconate Cloth  6 each Topical Daily  . feeding supplement (NEPRO CARB STEADY)  237 mL Oral TID BM  . gentamicin ointment  1 application Topical Daily  . heparin  5,000 Units Subcutaneous Q8H  . hydrALAZINE  100 mg Oral Q8H  . lanthanum  1,500 mg Oral  TID with meals  . lisinopril  20 mg Oral QHS  . metoprolol tartrate  100 mg Oral BID  . multivitamin  1 tablet Oral QHS  . mupirocin ointment  1 application Nasal BID  . ondansetron      . pantoprazole  40 mg Oral BID  . sucralfate  1 g Oral TID WC & HS   Continuous Infusions: . sodium chloride    . sodium chloride    . sodium chloride    . iron sucrose 100 mg (03/13/20 2023)     LOS: 3 days        Harlon Kutner Gerome Apley, MD

## 2020-03-14 NOTE — Evaluation (Signed)
Occupational Therapy Evaluation Patient Details Name: Patricia [name] MRN: [id number] DOB: 06/21/1975 Today's Date: 03/14/2020    History of Present Illness Pt is a 45 y/o female admitted 3/7 secondary to increased myoclonus, headache and chest pain. Found to be in hypertensive crisis. PMH includes ESRD on peritoneal dialysis, HTN, myoclonus, CVA, anxiety, and chronic anemia.   Clinical Impression   Patient admitted for the above diagnosis.  She is being converted from PD to HD.  PTA she lived at home with her two daughter's.  One is home all day, and can provide needed assist.  Patient states that the other daughter completes all home management and meal prep.  Daughter will also be assisting with medication management going forward.  Patient was independent with sink baths, and was able to move around her home without an AD.  Deficits impacting independence are listed below.  Currently, she is needing Min A for basic mobility with HHA to walk around the foot of her bed to the recliner.  Bed mobility has improved to largely supervision.  She is needing up to [facility] for ADL completion especially when standing.  She is open to [facility] at home, and using a RW.  OT will follow in the acute setting to maximize her functional status.      Follow Up Recommendations  Home health OT    Equipment Recommendations  Other (comment) (patient deferred [facility])    Recommendations for Other Services       Precautions / Restrictions Precautions Precautions: Fall Precaution Comments: myoclonic, improved. Restrictions Weight Bearing Restrictions: No      Mobility Bed Mobility Overal bed mobility: Needs Assistance Bed Mobility: Rolling;Supine to Sit;Sit to Supine Rolling: Modified independent (Device/Increase time)   Supine to sit: Supervision Sit to supine: Supervision        Transfers Overall transfer level: Needs assistance   Transfers: Sit to/from Stand;Stand Pivot Transfers Sit to  Stand: Min guard Stand pivot transfers: Min assist            Balance Overall balance assessment: Needs assistance Sitting-balance support: Feet supported;Single extremity supported Sitting balance-Leahy Scale: Fair     Standing balance support: Single extremity supported Standing balance-Leahy Scale: Poor Standing balance comment: needs external support                           ADL either performed or assessed with clinical judgement   ADL Overall ADL's : Needs assistance/impaired Eating/Feeding: Independent;Sitting   Grooming: Wash/dry hands;Wash/dry face;Set up;Sitting           Upper Body Dressing : Sitting;Set up   Lower Body Dressing: Min guard;Sit to/from stand               Functional mobility during ADLs: Minimal assistance General ADL Comments: HHA to walk around the foot of the bed to recliner     Vision Patient Visual Report: No change from baseline       Perception     Praxis      Pertinent Vitals/Pain Pain Assessment: Faces Faces Pain Scale: Hurts a little bit Pain Location: L shoulder Pain Descriptors / Indicators: Aching;Tender Pain Intervention(s): Monitored during session     Hand Dominance Right   Extremity/Trunk Assessment Upper Extremity Assessment Upper Extremity Assessment: Overall WFL for tasks assessed RUE Deficits / Details: improved LUE Deficits / Details: improved   Lower Extremity Assessment Lower Extremity Assessment: Defer to PT evaluation   Cervical / Trunk Assessment  Cervical / Trunk Exceptions: patient able to take meds with nursing.  Continued myoclonic movements to face/eyes   Communication Communication Communication: No difficulties   Cognition Arousal/Alertness: Awake/alert Behavior During Therapy: WFL for tasks assessed/performed Overall Cognitive Status: No family/caregiver present to determine baseline cognitive functioning                                     General  Comments   BP elevated, nursing aware and discussing with MD.  Session cleared for OOB    Exercises     Shoulder Instructions      Home Living Family/patient expects to be discharged to:: Private residence Living Arrangements: Children Available Help at Discharge: Family;Available 24 hours/day Type of Home: House Home Access: Level entry     Home Layout: Two level Alternate Level Stairs-Number of Steps: flight Alternate Level Stairs-Rails: Right Bathroom Shower/Tub: Teacher, early years/pre: Standard     Home Equipment: None   Additional Comments: patient completes sink baths      Prior Functioning/Environment Level of Independence: Independent                 OT Problem List: Decreased activity tolerance;Impaired balance (sitting and/or standing);Decreased knowledge of use of DME or AE;Pain      OT Treatment/Interventions: Self-care/ADL training;Therapeutic exercise;DME and/or AE instruction;Patient/family education;Therapeutic activities;Balance training    OT Goals(Current goals can be found in the care plan section) Acute Rehab OT Goals Patient Stated Goal: I need to be more steady. OT Goal Formulation: With patient Time For Goal Achievement: 03/28/20 Potential to Achieve Goals: Good ADL Goals Pt Will Perform Grooming: with modified independence;sitting;standing Pt Will Perform Lower Body Bathing: with modified independence;sit to/from stand Pt Will Perform Lower Body Dressing: with modified independence;sit to/from stand Pt Will Transfer to Toilet: with modified independence;ambulating Pt Will Perform Toileting - Clothing Manipulation and hygiene: with modified independence;sit to/from stand  OT Frequency: Min 2X/week   Barriers to D/C:    none noted       Co-evaluation              AM-PAC OT "6 Clicks" Daily Activity     Outcome Measure Help from another person eating meals?: None Help from another person taking care of personal  grooming?: A Little Help from another person toileting, which includes using toliet, bedpan, or urinal?: A Little Help from another person bathing (including washing, rinsing, drying)?: A Little Help from another person to put on and taking off regular upper body clothing?: A Little Help from another person to put on and taking off regular lower body clothing?: A Little 6 Click Score: 19   End of Session Equipment Utilized During Treatment: Gait belt Nurse Communication: Mobility status  Activity Tolerance: Patient tolerated treatment well Patient left: in chair;with call bell/phone within reach  OT Visit Diagnosis: Unsteadiness on feet (R26.81);Dizziness and giddiness (R42);Pain Pain - Right/Left: Left Pain - part of body: Shoulder                Time: 4076-8088 OT Time Calculation (min): 19 min Charges:  OT General Charges $OT Visit: 1 Visit OT Evaluation $OT Eval Moderate Complexity: 1 Mod  03/14/2020  Rich, OTR/L  Acute Rehabilitation Services  Office:  [phone]   Metta Clines 03/14/2020, 11:37 AM

## 2020-03-14 NOTE — Progress Notes (Signed)
Pt off unit in Hemodialysis.

## 2020-03-14 NOTE — Progress Notes (Signed)
Pt's BP 199/108. Gave scheduled PO hydralazine. Sent chat to Dr. Cathlean Sauer who ordered lisinopril. See MAR. Will continue to monitor.

## 2020-03-14 NOTE — Progress Notes (Signed)
Renal Navigator updated by Renal PA/R. Owens Shark that patient will be a permanent HD patient now, PD catheter to be removed, and will need to return to in-center. Navigator contacted [facility] Manager/B. Morris to inform and request modality change/seat schedule. Clinic Manager will provide seat schedule and Navigator will update with discharge/start date and follow up with patient.   Alphonzo Cruise, [place] Renal Navigator [phone]

## 2020-03-14 NOTE — Progress Notes (Addendum)
[facility] Progress Note   Subjective: Seen in room, myoclonus improved 50% after HD yesterday. Feels better, ate breakfast for 1st time since admission. Agrees to have PD catheter removed, permanent access placed. She understands that PD is not a viable option for her anymore.    Objective Vitals:   03/14/20 0456 03/14/20 0500 03/14/20 0515 03/14/20 0600  BP: (!) 184/85 (!) 184/78 (!) 166/85 (!) 182/96  Pulse: 79 80 79 77  Resp: _0 (!) 22  Temp: 98.6 F (37 C)     TempSrc: Oral     SpO2: 94%     Weight:       Physical Exam General::Obese femalein no acute distress Heart: S1,S2 RRR. SR on monitor.  Lungs: CTAB Abdomen: Obese, active BS. PD catheter intact.  Extremities: No LE edema  Dialysis Access: PD catheter, drsg CDI. LIJ TDC Drsg CDI.   Additional Objective Labs: Basic Metabolic Panel: Recent Labs  Lab 03/12/20 0446 03/13/20 0357 03/14/20 0657  NA 136 134* 135  K 3.6 3.2* 3.7  CL 96* 92* 98  CO2 _1 GLUCOSE 104* 137* 95  BUN 38* 37* 19  CREATININE 12.85* 12.28* 8.19*  CALCIUM 10.0 9.5 9.3  PHOS  --  7.0* 4.8*   Liver Function Tests: Recent Labs  Lab 03/13/20 0357 03/14/20 0657  ALBUMIN 2.9* 2.6*   No results for input(s): LIPASE, AMYLASE in the last 168 hours. CBC: Recent Labs  Lab 03/11/20 1430 03/12/20 0446 03/13/20 0357 03/14/20 0657  WBC 7.6 7.2 8.0 7.0  HGB 8.9* 7.6* 7.5* 7.8*  HCT 28.5* 23.8* 23.5* 24.9*  MCV 89.9 88.5 87.0 88.3  PLT 303 256 308 263   Blood Culture    Component Value Date/Time   SDES BLOOD RIGHT HAND 08/23/2018 2120   SPECREQUEST  08/23/2018 2120    BOTTLES DRAWN AEROBIC ONLY Blood Culture adequate volume   CULT  08/23/2018 2120    NO GROWTH 5 DAYS Performed at [facility], [place] [address] [place], [place] [address]    REPTSTATUS 08/28/2018 FINAL 08/23/2018 2120    Cardiac Enzymes: No results for input(s): CKTOTAL, CKMB, CKMBINDEX, TROPONINI in the last 168 hours. CBG: No  results for input(s): GLUCAP in the last 168 hours. Iron Studies:  Recent Labs    03/13/20 0357  IRON 47  TIBC 220*  FERRITIN 1,355*   _2 @ Studies/Results: IR Fluoro Guide CV Line Left  Result Date: 03/13/2020 INDICATION: 45 year old female in need of hemodialysis. She presents for placement of a tunneled hemodialysis catheter. Evaluation of the right internal jugular vein demonstrates partial thrombosis. Therefore, we will proceed with a left-sided catheter placement. EXAM: TUNNELED CENTRAL VENOUS HEMODIALYSIS CATHETER PLACEMENT WITH ULTRASOUND AND FLUOROSCOPIC GUIDANCE MEDICATIONS: 5 mg metoprolol IV ANESTHESIA/SEDATION: Moderate (conscious) sedation was employed during this procedure. A total of Versed 1 mg and Fentanyl 100 mcg was administered intravenously. Moderate Sedation Time: 19 minutes. The patient's level of consciousness and vital signs were monitored continuously by radiology nursing throughout the procedure under my direct supervision. FLUOROSCOPY TIME:  Fluoroscopy Time: 0 minutes 30 seconds (2 mGy). COMPLICATIONS: None immediate. PROCEDURE: Informed written consent was obtained from the patient after a discussion of the risks, benefits, and alternatives to treatment. Questions regarding the procedure were encouraged and answered. The left neck and chest were prepped with chlorhexidine in a sterile fashion, and a sterile drape was applied covering the operative field. Maximum barrier sterile technique with sterile gowns and gloves were used for  the procedure. A timeout was performed prior to the initiation of the procedure. After creating a small venotomy incision, a micropuncture kit was utilized to access the left internal jugular vein under direct, real-time ultrasound guidance after the overlying soft tissues were anesthetized with 1% lidocaine with epinephrine. Ultrasound image documentation was performed. The microwire was kinked to measure appropriate catheter length. A  stiff Glidewire was advanced to the level of the IVC and the micropuncture sheath was exchanged for a peel-away sheath. A palindrome tunneled hemodialysis catheter measuring 23 cm from tip to cuff was tunneled in a retrograde fashion from the anterior chest wall to the venotomy incision. The catheter was then placed through the peel-away sheath with tips ultimately positioned within the superior aspect of the right atrium. Final catheter positioning was confirmed and documented with a spot radiographic image. The catheter aspirates and flushes normally. The catheter was flushed with appropriate volume heparin dwells. The catheter exit site was secured with a 0-Prolene retention suture. The venotomy incision was closed with Dermabond. Dressings were applied. The patient tolerated the procedure well without immediate post procedural complication. IMPRESSION: Successful placement of 23 cm tip to cuff tunneled hemodialysis catheter via the left internal jugular vein with tips terminating within the superior aspect of the right atrium. The catheter is ready for immediate use. Electronically Signed   By: Jacqulynn Cadet M.D.   On: 03/13/2020 13:01   IR US Guide Vasc Access Left  Result Date: 03/13/2020 INDICATION: 45 year old female in need of hemodialysis. She presents for placement of a tunneled hemodialysis catheter. Evaluation of the right internal jugular vein demonstrates partial thrombosis. Therefore, we will proceed with a left-sided catheter placement. EXAM: TUNNELED CENTRAL VENOUS HEMODIALYSIS CATHETER PLACEMENT WITH ULTRASOUND AND FLUOROSCOPIC GUIDANCE MEDICATIONS: 5 mg metoprolol IV ANESTHESIA/SEDATION: Moderate (conscious) sedation was employed during this procedure. A total of Versed 1 mg and Fentanyl 100 mcg was administered intravenously. Moderate Sedation Time: 19 minutes. The patient's level of consciousness and vital signs were monitored continuously by radiology nursing throughout the procedure  under my direct supervision. FLUOROSCOPY TIME:  Fluoroscopy Time: 0 minutes 30 seconds (2 mGy). COMPLICATIONS: None immediate. PROCEDURE: Informed written consent was obtained from the patient after a discussion of the risks, benefits, and alternatives to treatment. Questions regarding the procedure were encouraged and answered. The left neck and chest were prepped with chlorhexidine in a sterile fashion, and a sterile drape was applied covering the operative field. Maximum barrier sterile technique with sterile gowns and gloves were used for the procedure. A timeout was performed prior to the initiation of the procedure. After creating a small venotomy incision, a micropuncture kit was utilized to access the left internal jugular vein under direct, real-time ultrasound guidance after the overlying soft tissues were anesthetized with 1% lidocaine with epinephrine. Ultrasound image documentation was performed. The microwire was kinked to measure appropriate catheter length. A stiff Glidewire was advanced to the level of the IVC and the micropuncture sheath was exchanged for a peel-away sheath. A palindrome tunneled hemodialysis catheter measuring 23 cm from tip to cuff was tunneled in a retrograde fashion from the anterior chest wall to the venotomy incision. The catheter was then placed through the peel-away sheath with tips ultimately positioned within the superior aspect of the right atrium. Final catheter positioning was confirmed and documented with a spot radiographic image. The catheter aspirates and flushes normally. The catheter was flushed with appropriate volume heparin dwells. The catheter exit site was secured with a 0-Prolene retention suture.  The venotomy incision was closed with Dermabond. Dressings were applied. The patient tolerated the procedure well without immediate post procedural complication. IMPRESSION: Successful placement of 23 cm tip to cuff tunneled hemodialysis catheter via the left  internal jugular vein with tips terminating within the superior aspect of the right atrium. The catheter is ready for immediate use. Electronically Signed   By: Jacqulynn Cadet M.D.   On: 03/13/2020 13:01   VAS [place] UPPER EXT VEIN MAPPING (PRE-OP AVF)  Result Date: 03/13/2020 UPPER EXTREMITY VEIN MAPPING  Indications: Pre-access. Comparison Study: previous 07/04/19 Performing Technologist: Abram Sander RVS  Examination Guidelines: A complete evaluation includes B-mode imaging, spectral Doppler, color Doppler, and power Doppler as needed of all accessible portions of each vessel. Bilateral testing is considered an integral part of a complete examination. Limited examinations for reoccurring indications may be performed as noted. +-----------------+-------------+----------+--------------+ Right Cephalic   Diameter (cm)Depth (cm)   Findings    +-----------------+-------------+----------+--------------+ Shoulder             0.36        1.65                  +-----------------+-------------+----------+--------------+ Prox upper arm       0.40        2.01                  +-----------------+-------------+----------+--------------+ Mid upper arm        0.47        1.27                  +-----------------+-------------+----------+--------------+ Dist upper arm       0.29        1.45                  +-----------------+-------------+----------+--------------+ Antecubital fossa    0.44        0.85     branching    +-----------------+-------------+----------+--------------+ Prox forearm                            not visualized +-----------------+-------------+----------+--------------+ Mid forearm                             not visualized +-----------------+-------------+----------+--------------+ Dist forearm                            not visualized +-----------------+-------------+----------+--------------+ Wrist                                   not visualized  +-----------------+-------------+----------+--------------+ +-----------------+-------------+----------+--------------+ Right Basilic    Diameter (cm)Depth (cm)   Findings    +-----------------+-------------+----------+--------------+ Prox upper arm       0.33        1.86                  +-----------------+-------------+----------+--------------+ Mid upper arm        0.37        1.87                  +-----------------+-------------+----------+--------------+ Dist upper arm       0.33        1.84                  +-----------------+-------------+----------+--------------+ Antecubital fossa  0.43        1.48                  +-----------------+-------------+----------+--------------+ Prox forearm         0.30        0.41                  +-----------------+-------------+----------+--------------+ Mid forearm          0.27        0.35                  +-----------------+-------------+----------+--------------+ Distal forearm                          not visualized +-----------------+-------------+----------+--------------+ Elbow                                   not visualized +-----------------+-------------+----------+--------------+ Wrist                                   not visualized +-----------------+-------------+----------+--------------+ *See table(s) above for measurements and observations.  Diagnosing physician: Harold Barban MD Electronically signed by Harold Barban MD on 03/13/2020 at 10:07:55 PM.    Final    Medications: . sodium chloride    . dialysis solution 2.5% low-MG/low-CA    . dialysis solution 4.25% low-MG/low-CA    . iron sucrose 100 mg (03/13/20 2023)   . allopurinol  100 mg Oral QHS  . atorvastatin  40 mg Oral q1800  . Chlorhexidine Gluconate Cloth  6 each Topical Daily  . feeding supplement (NEPRO CARB STEADY)  237 mL Oral TID BM  . gentamicin ointment  1 application Topical Daily  . heparin  5,000 Units Subcutaneous Q8H  .  heparin sodium (porcine)      . hydrALAZINE  50 mg Oral Q8H  . lanthanum  1,500 mg Oral TID with meals  . lisinopril  20 mg Oral QHS  . metoprolol tartrate  100 mg Oral BID  . multivitamin  1 tablet Oral QHS  . mupirocin ointment  1 application Nasal BID  . pantoprazole  40 mg Oral BID  . sucralfate  1 g Oral TID WC & HS     Assessment/Plan: 1. Hypertensive Crisis: Multifactorial. Probably a combination of under dialysis, mild-moderate volume overload and medical noncompliance. Has been on hydralazine as monotherapy and only taking once daily. Medication rec here does not match medication list at OP center. Resumed metoprolol at 50 mg PO BID 03/12/20 will increase to OP dose. Resumed Lisinopril 20 mg q HS.NTG gtt DC'd but BP not to goal. Increase hydralazine 100 mg PO Q 8 hours. 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 SCr down to 8.19 this AM. Myoclonus, appetite improved, feels better. Agrees to transition permanently to HD. Will consult [facility] for PD catheter removed. [facility] has been consulted for permanent access. Serial HD for next two days. Expect to see resolution of myoclonus. Have asked Gen Surg to eval for PD cath removal.  3. Chest pain: Has been on hydralazine without BB. Resumed metoprolol. Vasodilator without BB may precipitate chest pain. Troponins low with flat trend. No further chest pain today.  Per primary. 4. 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  Much improved this AM. Now permanent HD  patient.  Prior work up per neurology last month. Can continue benzodiazepine at current dose. Would avoid gapapentin at any dose until myoclonus completely resolved. Myoclonus is about 50% better today. Will plan daily HD until myoclonus completely resolved.  5. Hypokalemia: Unfortunately no 4.0 K baths available. K+ supplemented yesterday. K+ 3.7 today. Use 3.0 K bath with HD.  6. ESRD - Recent return to PD (03/03/2020) after being  placed on hemodialysis for for two weeks D/T poor clearance (Last HD 02/25/20220). Resume HD as soon as TDC in place. Renal Navigator notified to [facility] to [facility]. [facility] consulted for permanent access. Serial HD  7. Hypertension/volume - See #1. 8. Anemia -HGB 7.8. Last ESA Mircera 225 mcg 03/11/2020. Fe 47 Tsat 21. Ferritin elevated but will load with Fe here.  Follow HGB. 9. Metabolic bone disease - Calcium 10.4. Not on VDRA. Continue binders. PO4 elevated.  Increase fosrenol to 1.5g PO TID AC. PO4 now at goal.  10. Nutrition - Renal diet with fld restrictions. Add prosource, renal vits.  11. Medical Noncompliance-ongoing history of not taking medications as directed. Discussed at length with her daughter. Needs assistance with compliance with home medications.   [name] NP-C 03/14/2020, 9:50 AM  [facility] [phone]  Pt seen, examined and agree w assess/plan as above with additions as indicated.  [facility] 03/14/2020, 11:04 AM

## 2020-03-15 ENCOUNTER — Inpatient Hospital Stay (HOSPITAL_COMMUNITY): Payer: Medicare Other | Admitting: Anesthesiology

## 2020-03-15 ENCOUNTER — Encounter (HOSPITAL_COMMUNITY)
Admission: EM | Disposition: A | Discharge status: Home Health | Payer: Self-pay | Source: Home / Self Care | Attending: Internal Medicine

## 2020-03-15 ENCOUNTER — Encounter (HOSPITAL_COMMUNITY): Payer: Self-pay | Admitting: Family Medicine

## 2020-03-15 HISTORY — PX: AV FISTULA PLACEMENT: SHX1204

## 2020-03-15 LAB — CBC
HCT: 28.6 % — ABNORMAL LOW (ref 36.0–46.0)
Hemoglobin: 8.8 g/dL — ABNORMAL LOW (ref 12.0–15.0)
MCH: 27.4 pg (ref 26.0–34.0)
MCHC: 30.8 g/dL (ref 30.0–36.0)
MCV: 89.1 fL (ref 80.0–100.0)
Platelets: 291 10*3/uL (ref 150–400)
RBC: 3.21 MIL/uL — ABNORMAL LOW (ref 3.87–5.11)
RDW: 15.9 % — ABNORMAL HIGH (ref 11.5–15.5)
WBC: 8.9 10*3/uL (ref 4.0–10.5)
nRBC: 0 % (ref 0.0–0.2)

## 2020-03-15 LAB — POCT I-STAT, CHEM 8
BUN: 5 mg/dL — ABNORMAL LOW (ref 6–20)
Calcium, Ion: 1.15 mmol/L (ref 1.15–1.40)
Chloride: 100 mmol/L (ref 98–111)
Creatinine, Ser: 3 mg/dL — ABNORMAL HIGH (ref 0.44–1.00)
Glucose, Bld: 93 mg/dL (ref 70–99)
HCT: 32 % — ABNORMAL LOW (ref 36.0–46.0)
Hemoglobin: 10.9 g/dL — ABNORMAL LOW (ref 12.0–15.0)
Potassium: 3.5 mmol/L (ref 3.5–5.1)
Sodium: 138 mmol/L (ref 135–145)
TCO2: 29 mmol/L (ref 22–32)

## 2020-03-15 LAB — RENAL FUNCTION PANEL
Albumin: 2.9 g/dL — ABNORMAL LOW (ref 3.5–5.0)
Anion gap: 10 (ref 5–15)
BUN: 12 mg/dL (ref 6–20)
CO2: 29 mmol/L (ref 22–32)
Calcium: 10.1 mg/dL (ref 8.9–10.3)
Chloride: 98 mmol/L (ref 98–111)
Creatinine, Ser: 5.38 mg/dL — ABNORMAL HIGH (ref 0.44–1.00)
GFR, Estimated: 9 mL/min — ABNORMAL LOW (ref >60)
Glucose, Bld: 93 mg/dL (ref 70–99)
Phosphorus: 3.7 mg/dL (ref 2.5–4.6)
Potassium: 3.8 mmol/L (ref 3.5–5.1)
Sodium: 137 mmol/L (ref 135–145)

## 2020-03-15 SURGERY — ARTERIOVENOUS (AV) FISTULA CREATION
Anesthesia: Monitor Anesthesia Care | Site: Arm Upper | Laterality: Right

## 2020-03-15 MED ORDER — SODIUM CHLORIDE 0.9 % IV SOLN: INTRAVENOUS | Status: DC | PRN

## 2020-03-15 MED ORDER — CEFAZOLIN SODIUM-DEXTROSE 2-3 GM-%(50ML) IV SOLR
INTRAVENOUS | Status: DC | PRN
  Administered 2020-03-15: 2 g via INTRAVENOUS

## 2020-03-15 MED ORDER — PENTAFLUOROPROP-TETRAFLUOROETH EX AERO: 1.0000 "application " | INHALATION_SPRAY | CUTANEOUS | Status: DC | PRN

## 2020-03-15 MED ORDER — 0.9 % SODIUM CHLORIDE (POUR BTL) OPTIME
TOPICAL | Status: DC | PRN
  Administered 2020-03-15: 1000 mL

## 2020-03-15 MED ORDER — LIDOCAINE 2% (20 MG/ML) 5 ML SYRINGE
INTRAMUSCULAR | Status: DC | PRN
  Administered 2020-03-15: 60 mg via INTRAVENOUS

## 2020-03-15 MED ORDER — LIDOCAINE 2% (20 MG/ML) 5 ML SYRINGE
INTRAMUSCULAR | Status: AC
  Filled 2020-03-15: qty 5

## 2020-03-15 MED ORDER — HEPARIN SODIUM (PORCINE) 1000 UNIT/ML DIALYSIS
20.0000 [IU]/kg | INTRAMUSCULAR | Status: DC | PRN
  Filled 2020-03-15: qty 2

## 2020-03-15 MED ORDER — FENTANYL CITRATE (PF) 100 MCG/2ML IJ SOLN
25.0000 ug | INTRAMUSCULAR | Status: DC | PRN
  Administered 2020-03-15: 25 ug via INTRAVENOUS

## 2020-03-15 MED ORDER — LIDOCAINE-EPINEPHRINE (PF) 1 %-1:200000 IJ SOLN
INTRAMUSCULAR | Status: AC
  Filled 2020-03-15: qty 30

## 2020-03-15 MED ORDER — LIDOCAINE-PRILOCAINE 2.5-2.5 % EX CREA
1.0000 "application " | TOPICAL_CREAM | CUTANEOUS | Status: DC | PRN
  Filled 2020-03-15: qty 5

## 2020-03-15 MED ORDER — FENTANYL CITRATE (PF) 100 MCG/2ML IJ SOLN: 50.0000 ug | Freq: Once | INTRAMUSCULAR | Status: AC

## 2020-03-15 MED ORDER — ACETAMINOPHEN 325 MG PO TABS
ORAL_TABLET | ORAL | Status: AC
  Filled 2020-03-15: qty 2

## 2020-03-15 MED ORDER — SODIUM CHLORIDE 0.9 % IV SOLN
125.0000 mg | INTRAVENOUS | Status: DC
  Administered 2020-03-18: 125 mg via INTRAVENOUS
  Filled 2020-03-15: qty 10

## 2020-03-15 MED ORDER — CHLORHEXIDINE GLUCONATE 0.12 % MT SOLN
OROMUCOSAL | Status: AC
  Filled 2020-03-15: qty 15

## 2020-03-15 MED ORDER — ONDANSETRON HCL 4 MG/2ML IJ SOLN
INTRAMUSCULAR | Status: AC
  Filled 2020-03-15: qty 2

## 2020-03-15 MED ORDER — SODIUM CHLORIDE 0.9 % IV SOLN: 100.0000 mL | INTRAVENOUS | Status: DC | PRN

## 2020-03-15 MED ORDER — MIDAZOLAM HCL 2 MG/2ML IJ SOLN
INTRAMUSCULAR | Status: AC
  Administered 2020-03-15: 2 mg via INTRAVENOUS
  Filled 2020-03-15: qty 2

## 2020-03-15 MED ORDER — DEXAMETHASONE SODIUM PHOSPHATE 10 MG/ML IJ SOLN
INTRAMUSCULAR | Status: AC
  Filled 2020-03-15: qty 1

## 2020-03-15 MED ORDER — CEFAZOLIN SODIUM 1 G IJ SOLR
INTRAMUSCULAR | Status: AC
  Filled 2020-03-15: qty 20

## 2020-03-15 MED ORDER — PROPOFOL 500 MG/50ML IV EMUL
INTRAVENOUS | Status: DC | PRN
  Administered 2020-03-15: 150 ug/kg/min via INTRAVENOUS

## 2020-03-15 MED ORDER — MIDAZOLAM HCL 2 MG/2ML IJ SOLN: 2.0000 mg | Freq: Once | INTRAMUSCULAR | Status: AC

## 2020-03-15 MED ORDER — FENTANYL CITRATE (PF) 100 MCG/2ML IJ SOLN
INTRAMUSCULAR | Status: AC
  Filled 2020-03-15: qty 2

## 2020-03-15 MED ORDER — OXYCODONE-ACETAMINOPHEN 5-325 MG PO TABS
1.0000 | ORAL_TABLET | ORAL | Status: DC | PRN
  Administered 2020-03-15 – 2020-03-18 (×5): 1 via ORAL
  Filled 2020-03-15 (×5): qty 1

## 2020-03-15 MED ORDER — LIDOCAINE-EPINEPHRINE (PF) 1.5 %-1:200000 IJ SOLN
INTRAMUSCULAR | Status: DC | PRN
  Administered 2020-03-15: 30 mL via PERINEURAL

## 2020-03-15 MED ORDER — PROPOFOL 10 MG/ML IV BOLUS
INTRAVENOUS | Status: DC | PRN
  Administered 2020-03-15: 30 mg via INTRAVENOUS
  Administered 2020-03-15: 20 mg via INTRAVENOUS

## 2020-03-15 MED ORDER — ALTEPLASE 2 MG IJ SOLR: 2.0000 mg | Freq: Once | INTRAMUSCULAR | Status: DC | PRN

## 2020-03-15 MED ORDER — LIDOCAINE HCL (PF) 1 % IJ SOLN: 5.0000 mL | INTRAMUSCULAR | Status: DC | PRN

## 2020-03-15 MED ORDER — FENTANYL CITRATE (PF) 100 MCG/2ML IJ SOLN
INTRAMUSCULAR | Status: AC
  Administered 2020-03-15: 50 ug via INTRAVENOUS
  Filled 2020-03-15: qty 2

## 2020-03-15 MED ORDER — HEPARIN SODIUM (PORCINE) 1000 UNIT/ML DIALYSIS
1000.0000 [IU] | INTRAMUSCULAR | Status: DC | PRN
Start: 2020-03-15 — End: 2020-03-15
  Filled 2020-03-15: qty 1

## 2020-03-15 MED ORDER — SODIUM CHLORIDE 0.9 % IV SOLN
INTRAVENOUS | Status: AC
  Filled 2020-03-15: qty 1.2

## 2020-03-15 MED ORDER — PROPOFOL 1000 MG/100ML IV EMUL
INTRAVENOUS | Status: AC
  Filled 2020-03-15: qty 100

## 2020-03-15 MED ORDER — PROMETHAZINE HCL 25 MG/ML IJ SOLN: 6.2500 mg | INTRAMUSCULAR | Status: DC | PRN

## 2020-03-15 MED ORDER — SODIUM CHLORIDE 0.9 % IV SOLN
INTRAVENOUS | Status: DC | PRN
  Administered 2020-03-15: 500 mL

## 2020-03-15 SURGICAL SUPPLY — 32 items
ARMBAND PINK RESTRICT EXTREMIT (MISCELLANEOUS) ×4 IMPLANT
CANISTER SUCT 3000ML PPV (MISCELLANEOUS) ×2 IMPLANT
CLIP VESOCCLUDE MED 6/CT (CLIP) ×2 IMPLANT
CLIP VESOCCLUDE SM WIDE 6/CT (CLIP) ×2 IMPLANT
COVER MAYO STAND STRL (DRAPES) ×1 IMPLANT
COVER PROBE W GEL 5X96 (DRAPES) ×2 IMPLANT
COVER WAND RF STERILE (DRAPES) ×1 IMPLANT
DERMABOND ADVANCED (GAUZE/BANDAGES/DRESSINGS) ×1
DERMABOND ADVANCED .7 DNX12 (GAUZE/BANDAGES/DRESSINGS) ×1 IMPLANT
ELECT REM PT RETURN 9FT ADLT (ELECTROSURGICAL) ×2
ELECTRODE REM PT RTRN 9FT ADLT (ELECTROSURGICAL) ×1 IMPLANT
GLOVE BIOGEL PI IND STRL 7.5 (GLOVE) ×1 IMPLANT
GLOVE BIOGEL PI INDICATOR 7.5 (GLOVE) ×1
GLOVE SURG SS PI 7.5 STRL IVOR (GLOVE) ×2 IMPLANT
GOWN STRL REUS W/ TWL LRG LVL3 (GOWN DISPOSABLE) ×2 IMPLANT
GOWN STRL REUS W/ TWL XL LVL3 (GOWN DISPOSABLE) ×1 IMPLANT
GOWN STRL REUS W/TWL LRG LVL3 (GOWN DISPOSABLE) ×2
GOWN STRL REUS W/TWL XL LVL3 (GOWN DISPOSABLE) ×1
HEMOSTAT SNOW SURGICEL 2X4 (HEMOSTASIS) IMPLANT
KIT BASIN OR (CUSTOM PROCEDURE TRAY) ×2 IMPLANT
KIT TURNOVER KIT B (KITS) ×2 IMPLANT
NS IRRIG 1000ML POUR BTL (IV SOLUTION) ×2 IMPLANT
PACK CV ACCESS (CUSTOM PROCEDURE TRAY) ×2 IMPLANT
PAD ARMBOARD 7.5X6 YLW CONV (MISCELLANEOUS) ×4 IMPLANT
SUT PROLENE 6 0 BV (SUTURE) ×2 IMPLANT
SUT PROLENE 6 0 CC (SUTURE) ×1 IMPLANT
SUT VIC AB 3-0 SH 27 (SUTURE) ×1
SUT VIC AB 3-0 SH 27X BRD (SUTURE) ×1 IMPLANT
SUT VICRYL 4-0 PS2 18IN ABS (SUTURE) ×2 IMPLANT
TOWEL GREEN STERILE (TOWEL DISPOSABLE) ×2 IMPLANT
UNDERPAD 30X36 HEAVY ABSORB (UNDERPADS AND DIAPERS) ×2 IMPLANT
WATER STERILE IRR 1000ML POUR (IV SOLUTION) ×2 IMPLANT

## 2020-03-15 NOTE — Discharge Instructions (Signed)
° °  [facility] ° °Discharge Instructions ° °AV Fistula or Graft Surgery for Dialysis Access ° °Please refer to the following instructions for your post-procedure care. Your surgeon or physician assistant will discuss any changes with you. ° °Activity ° °You may drive the day following your surgery, if you are comfortable and no longer taking prescription pain medication. Resume full activity as the soreness in your incision resolves. ° °Bathing/Showering ° °You may shower after you go home. Keep your incision dry for 48 hours. Do not soak in a bathtub, hot tub, or swim until the incision heals completely. You may not shower if you have a hemodialysis catheter. ° °Incision Care ° °Clean your incision with mild soap and water after 48 hours. Pat the area dry with a clean towel. You do not need a bandage unless otherwise instructed. Do not apply any ointments or creams to your incision. You may have skin glue on your incision. Do not peel it off. It will come off on its own in about one week. Your arm may swell a bit after surgery. To reduce swelling use pillows to elevate your arm so it is above your heart. Your doctor will tell you if you need to lightly wrap your arm with an ACE bandage. ° °Diet ° °Resume your normal diet. There are not special food restrictions following this procedure. In order to heal from your surgery, it is CRITICAL to get adequate nutrition. Your body requires vitamins, minerals, and protein. Vegetables are the best source of vitamins and minerals. Vegetables also provide the perfect balance of protein. Processed food has little nutritional value, so try to avoid this. ° °Medications ° °Resume taking all of your medications. If your incision is causing pain, you may take over-the counter pain relievers such as acetaminophen (Tylenol). If you were prescribed a stronger pain medication, please be aware these medications can cause nausea and constipation. Prevent  nausea by taking the medication with a snack or meal. Avoid constipation by drinking plenty of fluids and eating foods with high amount of fiber, such as fruits, vegetables, and grains. Do not take Tylenol if you are taking prescription pain medications. ° ° ° ° °Follow up °Your surgeon may want to see you in the office following your access surgery. If so, this will be arranged at the time of your surgery. ° °Please call us immediately for any of the following conditions: ° °Increased pain, redness, drainage (pus) from your incision site °Fever of 101 degrees or higher °Severe or worsening pain at your incision site °Hand pain or numbness. ° °Reduce your risk of vascular disease: ° °Stop smoking. If you would like help, call [facility] at 1-800-QUIT-NOW (1-800-784-8669) or [facility] at 336-586-4000 ° °Manage your cholesterol °Maintain a desired weight °Control your diabetes °Keep your blood pressure down ° °Dialysis ° °It will take several weeks to several months for your new dialysis access to be ready for use. Your surgeon will determine when it is OK to use it. Your nephrologist will continue to direct your dialysis. You can continue to use your Permcath until your new access is ready for use. ° °If you have any questions, please call the office at [phone]. ° °

## 2020-03-15 NOTE — Anesthesia Procedure Notes (Signed)
Anesthesia Regional Block: Supraclavicular block   Pre-Anesthetic Checklist: ,, timeout performed, Correct Patient, Correct Site, Correct Laterality, Correct Procedure, Correct Position, site marked, Risks and benefits discussed,  Surgical consent,  Pre-op evaluation,  At surgeon's request and post-op pain management  Laterality: Right  Prep: Dura Prep       Needles:  Injection technique: Single-shot  Needle Type: Echogenic Stimulator Needle     Needle Length: 5cm  Needle Gauge: 20     Additional Needles:   Procedures:,,,, ultrasound used (permanent image in chart),,,,  Narrative:  Start time: 03/15/2020 10:50 AM End time: 03/15/2020 10:54 AM Injection made incrementally with aspirations every 5 mL.  Performed by: Personally  Anesthesiologist: Darral Dash, DO  Additional Notes: Patient identified. Risks/Benefits/Options discussed with patient including but not limited to bleeding, infection, nerve damage, failed block, incomplete pain control. Patient expressed understanding and wished to proceed. All questions were answered. Sterile technique was used throughout the entire procedure. Please see nursing notes for vital signs. Aspirated in 5cc intervals with injection for negative confirmation. Patient was given instructions on fall risk and not to get out of bed. All questions and concerns addressed with instructions to call with any issues or inadequate analgesia.

## 2020-03-15 NOTE — TOC Initial Note (Signed)
Transition of Care [facility]) - Initial/Assessment Note    Patient Details  Name: Cheila Wickstrom MRN: [id number] Date of Birth: 04-12-1975  Transition of Care [facility]) CM/SW Contact:    Zenon Mayo, RN Phone Number: 03/15/2020, 4:03 PM  Clinical Narrative:                 NCM spoke with patient at the bedside, offered choice, she states she does not have a preference of which agecny.  NCM made referral to [facility] with Alvis Lemmings for [name] for med management, [place], [place].  Awaiting call back to see if he can take referral.  She states she will need a rolling walker and a 3 n 1.  NCM asked if she wanted a w/chair, she states no she does not want a w/chair at this time.  NCM made referral to [facility] with [employer], most likely patient will dc on Monday.    Expected Discharge Plan: [place] Barriers to Discharge: Continued Medical Work up   Patient Goals and CMS Choice Patient states their goals for this hospitalization and ongoing recovery are:: build muscle, get back to normal CMS Medicare.gov Compare Post Acute Care list provided to:: Patient Choice offered to / list presented to : Patient  Expected Discharge Plan and Services Expected Discharge Plan: [place] In-house Referral: NA Discharge Planning Services: CM Consult Post Acute Care Choice: Durable Medical Equipment Living arrangements for the past 2 months: Single Family Home                 DME Arranged: Gilford Rile rolling,3-N-1 DME Agency: [employer] Date DME Agency Contacted: 03/15/20 Time DME Agency Contacted: [phone] Representative spoke with at DME Agency: [place]: RN,PT,OT [place] Agency: [place] Date [facility] Agency Contacted: 03/15/20 Time [place]: 1603 Representative spoke with at [place]: [place] Arrangements/Services Living arrangements for the past 2 months: [place] with:: Adult Children Patient language and need for  interpreter reviewed:: Yes Do you feel safe going back to the place where you live?: Yes      Need for Family Participation in Patient Care: Yes (Comment) Care giver support system in place?: Yes (comment)   Criminal Activity/Legal Involvement Pertinent to Current Situation/Hospitalization: No - Comment as needed  Activities of Daily Living Home Assistive Devices/Equipment: None ADL Screening (condition at time of admission) Patient's cognitive ability adequate to safely complete daily activities?: Yes Is the patient deaf or have difficulty hearing?: No Does the patient have difficulty seeing, even when wearing glasses/contacts?: No Does the patient have difficulty concentrating, remembering, or making decisions?: No Patient able to express need for assistance with ADLs?: Yes Does the patient have difficulty dressing or bathing?: No Independently performs ADLs?: Yes (appropriate for developmental age) Does the patient have difficulty walking or climbing stairs?: Yes Weakness of Legs: Both Weakness of Arms/Hands: None  Permission Sought/Granted                  Emotional Assessment Appearance:: Appears stated age Attitude/Demeanor/Rapport: Engaged Affect (typically observed): Appropriate Orientation: : Oriented to Self,Oriented to Place,Oriented to  Time,Oriented to Situation Alcohol / Substance Use: Not Applicable Psych Involvement: No (comment)  Admission diagnosis:  Hypertensive crisis [I16.9] Hypertensive urgency [I16.0] Stage 5 chronic kidney disease on chronic dialysis ([place]) [N18.6, Z99.2] Patient Active Problem List   Diagnosis Date Noted  . Malnutrition of moderate degree 03/13/2020  . Hypertensive crisis without congestive heart failure 03/11/2020  .  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 Pericolonic abscess 02/06/2020  . Chest pain 05/10/2019  . Anemia 04/25/2019  . SVT (supraventricular tachycardia) ([place]) 04/25/2019  . Elevated troponin 04/25/2019  . ESRD on hemodialysis  ([place]) 04/25/2019  . Acute on chronic blood loss anemia 05/21/2018  . GI bleed 05/20/2018  . Symptomatic anemia 10/28/2017  . ESRD on peritoneal dialysis ([place]) 04/01/2016  . Anemia due to chronic kidney disease 04/01/2016  . GERD (gastroesophageal reflux disease) 04/01/2016  . Atypical chest pain   . Nephrotic syndrome 10/02/2013  . Dyslipidemia 10/02/2013  . PAT (paroxysmal atrial tachycardia) ([place]) 09/29/2013  . Normocytic anemia 06/29/2013  . Glomerulonephritis 01/28/2013  . Morbid obesity ([place]) 07/04/2011  . Hypertension 07/04/2011  . Hypercholesterolemia 07/04/2011   PCP:  Doyle Askew, PA-C Pharmacy:   [facility] [place], [place] - [address] [name] [address] Phone: [phone] Fax: [place], [place] - [address] [place] [address] Phone: [phone] Fax: [phone]     Social Determinants of Health (SDOH) Interventions    Readmission Risk Interventions Readmission Risk Prevention Plan 03/15/2020 04/28/2019  Transportation Screening Complete Complete  PCP or Specialist Appt within 3-5 Days - Complete  HRI or [place] - Complete  Social Work Consult for [place] Planning/Counseling - Complete  Palliative Care Screening - Not Applicable  Medication Review Press photographer) Complete Complete  HRI or Home Care Consult Complete -  SW Recovery Care/Counseling Consult Complete -  Palliative Care Screening Not Applicable -  [place] Not Applicable -  Some recent data might be hidden

## 2020-03-15 NOTE — Progress Notes (Signed)
PROGRESS NOTE    Patricia [name]  AGT:[id number] DOB: 03/09/1975 DOA: 03/11/2020 PCP: Doyle Askew, PA-C    Brief Narrative:  Patricia [name] was admitted to the hospital with the working diagnosis of hypertensive emergency in the setting of hypervolemia and uremia related to ESRD.  45 year old female past medical history for end-stage renal disease on peritoneal dialysis, hypertension, chronic anemia, myoclonia and anxiety who presented with headache, chest pain and increased jerking movements. At home patient developed constant chest pain, nonradiating, associated myoclonus and headaches. On her initial physical examination blood pressure 210/82, heart rate 62, respirate 23, oxygen saturation 97%,her lungs were clear to auscultation bilaterally, heart S1-S2, present rhythmic, her abdomen was nondistended, no lower extremity edema. She had rapid periocular muscle twitching and eye deviation. Positive myoclonia.  Patient was placed on nitroglycerin drip with improvement of blood pressure. Dose of alprazolam was reduced.  Peritoneal dialysis was continued during her hospital stay,but continue patient to have myoclonic movements, considered to be uremic in nature.  03/09patient underwent tunneled HD catheter placement left IJ. Myoclonus has been improving with hemodialysis, plan to continue with HD as outpatient. Vascular surgery has been consulted for AV fistula creation.    Assessment & Plan:   Principal Problem:   Hypertensive crisis without congestive heart failure Active Problems:   Normocytic anemia   ESRD on peritoneal dialysis (HCC)   Chest pain   Myoclonia   Malnutrition of moderate degree    1. Hypertensive emergency. Patient is feeling better, but not yet back to baseline. Blood pressure today down to 175/85 and 155/90.   Continue blood pressure control with Hydralazine 100 mg po tid,  lisinopril 20 mg po daily, metoprolol 100 mg po bd and  amlodipine.  Further ultrafiltration per nephrology recommendations.   2. ESRD on PD/ hypokalemia . K is 3,5 and bicarbonate 29, BUN down to 5.  Improved volume status and blood pressure. Decreased frequency and intensity of myoclonus.  On fosrenol.   Patient will continue with HD as outpatient, now has a AV fistula in place and plan to remove PD catheter before discharge.   3. Anemia of chronic renal disease. continue with IV iron therapy.  Stable hb at 10,9 and hct at 32.0  4. Anxiety. Continue with as needed alprazolam.   5. Myoclonic movements. Outpatient workup as outpatient has been negative. Frequency and intensity of jerking movements have improved but not yet resolved.   6. Moderate calorie protein malnutrition. Continue with nutritional supplementation.   7. Dyslipidemia. Continue with atorvastatin.   Status is: Inpatient  Remains inpatient appropriate because:Inpatient level of care appropriate due to severity of illness   Dispo: The patient is from: Home              Anticipated d/c is to: Home              Patient currently is not medically stable to d/c.   Difficult to place patient No   DVT prophylaxis: Heparin   Code Status:   full  Family Communication:  No family at the bedside      Nutrition Status: Nutrition Problem: Moderate Malnutrition Etiology: chronic illness (ESRD) Signs/Symptoms: mild muscle depletion,mild fat depletion,energy intake < 75% for > or equal to 1 month Interventions: MVI,Nepro shake     Consultants:   Nephrology   Vascular surgery   Procedures:   Right upper extremity with AV fistula creation left upper extremity       Subjective: Patient is feeling better,  but not yet back to baseline, continue to have jerking facial movement, no nausea or vomiting no chest pain or dyspnea,   Objective: Vitals:   03/15/20 1300 03/15/20 1315 03/15/20 1317 03/15/20 1330  BP: (!) 163/89  (!) 177/97 (!) 175/86  Pulse: 74  71 71 69  Resp: (!) 25 (!) 24 (!) 23 15  Temp:    99.1 F (37.3 C)  TempSrc:      SpO2: 99% 97% 97% 97%  Weight:        Intake/Output Summary (Last 24 hours) at 03/15/2020 1353 Last data filed at 03/15/2020 1330 Gross per 24 hour  Intake 465 ml  Output 4862 ml  Net -4397 ml   Filed Weights   03/15/20 0407 03/15/20 0700 03/15/20 1002  Weight: 96.2 kg 98.5 kg 97.2 kg    Examination:   General: Not in pain or dyspnea, deconditioned  Neurology: Awake and alert, non focal  E ENT: no pallor, no icterus, oral mucosa moist Cardiovascular: No JVD. S1-S2 present, rhythmic, no gallops, rubs, or murmurs. No lower extremity edema. Pulmonary: positive breath sounds bilaterally, with no wheezing, rhonchi or rales. Gastrointestinal. Abdomen soft and non tender. PD catheter in place,  Skin. No rashes Musculoskeletal: no joint deformities     Data Reviewed: I have personally reviewed following labs and imaging studies  CBC: Recent Labs  Lab 03/11/20 1430 03/12/20 0446 03/13/20 0357 03/14/20 0657 03/15/20 0257 03/15/20 1025  WBC 7.6 7.2 8.0 7.0 8.9  --   HGB 8.9* 7.6* 7.5* 7.8* 8.8* 10.9*  HCT 28.5* 23.8* 23.5* 24.9* 28.6* 32.0*  MCV 89.9 88.5 87.0 88.3 89.1  --   PLT 303 256 308 263 291  --    Basic Metabolic Panel: Recent Labs  Lab 03/11/20 1430 03/12/20 0446 03/13/20 0357 03/14/20 0657 03/15/20 0257 03/15/20 1025  NA 136 136 134* 135 137 138  K 3.8 3.6 3.2* 3.7 3.8 3.5  CL 96* 96* 92* 98 98 100  CO2 $Re'22 23 25 28 29  'gZb$ --   GLUCOSE 107* 104* 137* 95 93 93  BUN 32* 38* 37* 19 12 5*  CREATININE 11.80* 12.85* 12.28* 8.19* 5.38* 3.00*  CALCIUM 10.4* 10.0 9.5 9.3 10.1  --   PHOS  --   --  7.0* 4.8* 3.7  --    GFR: Estimated Creatinine Clearance: 28 mL/min (A) (by C-G formula based on SCr of 3 mg/dL (H)). Liver Function Tests: Recent Labs  Lab 03/13/20 0357 03/14/20 0657 03/15/20 0257  ALBUMIN 2.9* 2.6* 2.9*   No results for input(s): LIPASE, AMYLASE in the last  168 hours. No results for input(s): AMMONIA in the last 168 hours. Coagulation Profile: No results for input(s): INR, PROTIME in the last 168 hours. Cardiac Enzymes: No results for input(s): CKTOTAL, CKMB, CKMBINDEX, TROPONINI in the last 168 hours. BNP (last 3 results) No results for input(s): PROBNP in the last 8760 hours. HbA1C: No results for input(s): HGBA1C in the last 72 hours. CBG: No results for input(s): GLUCAP in the last 168 hours. Lipid Profile: No results for input(s): CHOL, HDL, LDLCALC, TRIG, CHOLHDL, LDLDIRECT in the last 72 hours. Thyroid Function Tests: No results for input(s): TSH, T4TOTAL, FREET4, T3FREE, THYROIDAB in the last 72 hours. Anemia Panel: Recent Labs    03/13/20 0357  FERRITIN 1,355*  TIBC 220*  IRON 47      Radiology Studies: I have reviewed all of the imaging during this hospital visit personally     Scheduled Meds: . (feeding  supplement) PROSource Plus  30 mL Oral BID BM  . allopurinol  100 mg Oral QHS  . amLODipine  10 mg Oral Daily  . atorvastatin  40 mg Oral q1800  . chlorhexidine      . Chlorhexidine Gluconate Cloth  6 each Topical Daily  . feeding supplement (NEPRO CARB STEADY)  237 mL Oral TID BM  . fentaNYL      . gentamicin ointment  1 application Topical Daily  . heparin  5,000 Units Subcutaneous Q8H  . hydrALAZINE  100 mg Oral Q8H  . lanthanum  1,500 mg Oral TID with meals  . lisinopril  20 mg Oral QHS  . metoprolol tartrate  100 mg Oral BID  . multivitamin  1 tablet Oral QHS  . mupirocin ointment  1 application Nasal BID  . pantoprazole  40 mg Oral BID  . sucralfate  1 g Oral TID WC & HS   Continuous Infusions: . sodium chloride    . iron sucrose 100 mg (03/13/20 2023)     LOS: 4 days        Patricia [name] Gerome Apley, MD

## 2020-03-15 NOTE — Anesthesia Preprocedure Evaluation (Addendum)
Anesthesia Evaluation  Patient identified by MRN, date of birth, ID band Patient awake    Reviewed: Allergy & Precautions, NPO status , Patient's Chart, lab work & pertinent test results  Airway Mallampati: II  TM Distance: >3 FB Neck ROM: Full    Dental  (+) Teeth Intact   Pulmonary    Pulmonary exam normal        Cardiovascular hypertension, Pt. on medications and Pt. on home beta blockers + dysrhythmias  Rhythm:Regular Rate:Normal     Neuro/Psych  Headaches, Anxiety CVA    GI/Hepatic Neg liver ROS, GERD  Medicated,  Endo/Other  negative endocrine ROS  Renal/GU ESRF and DialysisRenal disease (PD)  negative genitourinary   Musculoskeletal  (+) Arthritis , Osteoarthritis,    Abdominal (+)  Abdomen: soft. Bowel sounds: normal.  Peds  Hematology  (+) anemia ,   Anesthesia Other Findings   Reproductive/Obstetrics                            Anesthesia Physical Anesthesia Plan  ASA: III  Anesthesia Plan: MAC and Regional   Post-op Pain Management:    Induction: Intravenous  PONV Risk Score and Plan: 2 and Ondansetron, Midazolam and Propofol infusion  Airway Management Planned: Simple Face Mask, Natural Airway and Nasal Cannula  Additional Equipment: None  Intra-op Plan:   Post-operative Plan:   Informed Consent: I have reviewed the patients History and Physical, chart, labs and discussed the procedure including the risks, benefits and alternatives for the proposed anesthesia with the patient or authorized representative who has indicated his/her understanding and acceptance.     Dental advisory given  Plan Discussed with: CRNA  Anesthesia Plan Comments: (Lab Results      Component                Value               Date                      WBC                      8.9                 03/15/2020                HGB                      8.8 (L)             03/15/2020                 HCT                      28.6 (L)            03/15/2020                MCV                      89.1                03/15/2020                PLT                      291  03/15/2020           Lab Results      Component                Value               Date                      NA                       137                 03/15/2020                K                        3.8                 03/15/2020                CO2                      29                  03/15/2020                GLUCOSE                  93                  03/15/2020                BUN                      12                  03/15/2020                CREATININE               5.38 (H)            03/15/2020                CALCIUM                  10.1                03/15/2020                GFRNONAA                 9 (L)               03/15/2020                GFRAA                    4 (L)               05/14/2019           ECHO 04/21: 1. Left ventricular ejection fraction, by estimation, is 55 to 60%. The  left ventricle has normal function. The left ventricle has no regional  wall motion abnormalities. Left ventricular diastolic parameters are  consistent with Grade I diastolic  dysfunction (impaired relaxation).  2. Right ventricular systolic function is normal. The right ventricular  size is normal. Tricuspid regurgitation  signal is inadequate for assessing  PA pressure.  3. The mitral valve is normal in structure. Trivial mitral valve  regurgitation. No evidence of mitral stenosis.  4. The aortic valve is tricuspid. Aortic valve regurgitation is not  visualized. No aortic stenosis is present.  5. The inferior vena cava is normal in size with greater than 50%  respiratory variability, suggesting right atrial pressure of 3 mmHg. )      Anesthesia Quick Evaluation

## 2020-03-15 NOTE — Progress Notes (Signed)
[facility] Progress Note   Subjective: Seen on HD. Myoclonus continues to improve. Not gone completely though. Vomited last night.   Objective Vitals:   03/15/20 1317 03/15/20 1330 03/15/20 1359 03/15/20 1500  BP: (!) 177/97 (!) 175/86 (!) 155/90 (!) 172/93  Pulse: 71 69 72 75  Resp: (!) $RemoveB'23 15 12 19  'YIonuTGw$ Temp:  99.1 F (37.3 C) 98.3 F (36.8 C)   TempSrc:   Oral   SpO2: 97% 97% 98% 99%  Weight:       Physical Exam General::Obese femalein no acute distress Heart: S1,S2 RRR. SR on monitor.  Lungs: CTAB Abdomen: Obese, active BS. PD catheter intact.  Extremities: No LE edema  Dialysis Access: PD catheter, drsg CDI. LIJ TDC Drsg CDI.  Neuro: mild eyelid fluttering, no asterixis  Assessment/Plan: 1. Hypertensive Crisis: improved, but not tolerating large UF w/ HD today. Prob close to dry wt.  2. Myoclonus: due to underdialysis on PD. We are transitioning to HD. Gen surg declined request to remove PD cath here,  this can be done in OP setting.  3. ESRD on PD: failing PD, needs HD for clearance of uremia. CLIP pt to OP HD. Having perm access surgery today I believe. HD today then Monday.  4. Chest pain: resolving.  5. Anemia -HGB 7.8. Last ESA Mircera 225 mcg 03/11/2020. Fe 47 Tsat 21. Ferritin elevated but will load with Fe here.  Follow HGB. 6. Metabolic bone disease - Calcium 10.4. Not on VDRA. Continue binders. PO4 elevated.  Increase fosrenol to 1.5g PO TID AC. PO4 now at goal.  7. Nutrition - Renal diet with fld restrictions. Add prosource, renal vits.  8. Medical Noncompliance-ongoing history of not taking medications as directed. Discussed at length with her daughter. Needs assistance with compliance with home medications.    Kelly Splinter, MD 03/15/2020, 3:43 PM     Additional Objective Labs: Basic Metabolic Panel: Recent Labs  Lab 03/13/20 0357 03/14/20 0657 03/15/20 0257 03/15/20 1025  NA 134* 135 137 138  K 3.2* 3.7 3.8 3.5  CL 92* 98 98  100  CO2 $Re'25 28 29  'mzo$ --   GLUCOSE 137* 95 93 93  BUN 37* 19 12 5*  CREATININE 12.28* 8.19* 5.38* 3.00*  CALCIUM 9.5 9.3 10.1  --   PHOS 7.0* 4.8* 3.7  --    Liver Function Tests: Recent Labs  Lab 03/13/20 0357 03/14/20 0657 03/15/20 0257  ALBUMIN 2.9* 2.6* 2.9*   No results for input(s): LIPASE, AMYLASE in the last 168 hours. CBC: Recent Labs  Lab 03/11/20 1430 03/12/20 0446 03/13/20 0357 03/14/20 0657 03/15/20 0257 03/15/20 1025  WBC 7.6 7.2 8.0 7.0 8.9  --   HGB 8.9* 7.6* 7.5* 7.8* 8.8* 10.9*  HCT 28.5* 23.8* 23.5* 24.9* 28.6* 32.0*  MCV 89.9 88.5 87.0 88.3 89.1  --   PLT 303 256 308 263 291  --    Blood Culture    Component Value Date/Time   SDES BLOOD RIGHT HAND 08/23/2018 2120   SPECREQUEST  08/23/2018 2120    BOTTLES DRAWN AEROBIC ONLY Blood Culture adequate volume   CULT  08/23/2018 2120    NO GROWTH 5 DAYS Performed at [facility] [address], [place], [place] [address]    REPTSTATUS 08/28/2018 FINAL 08/23/2018 2120    Cardiac Enzymes: No results for input(s): CKTOTAL, CKMB, CKMBINDEX, TROPONINI in the last 168 hours. CBG: No results for input(s): GLUCAP in the last 168 hours. Iron Studies:  Recent Labs  03/13/20 0357  IRON 47  TIBC 220*  FERRITIN 1,355*   '@lablastinr3'$ @ Studies/Results: No results found. Medications: . sodium chloride    . [START ON 03/18/2020] ferric gluconate (FERRLECIT/NULECIT) IV    . iron sucrose 100 mg (03/13/20 2023)   . (feeding supplement) PROSource Plus  30 mL Oral BID BM  . allopurinol  100 mg Oral QHS  . amLODipine  10 mg Oral Daily  . atorvastatin  40 mg Oral q1800  . Chlorhexidine Gluconate Cloth  6 each Topical Daily  . feeding supplement (NEPRO CARB STEADY)  237 mL Oral TID BM  . fentaNYL      . gentamicin ointment  1 application Topical Daily  . heparin  5,000 Units Subcutaneous Q8H  . hydrALAZINE  100 mg Oral Q8H  . lanthanum  1,500 mg Oral TID with meals  . lisinopril  20 mg Oral QHS  .  metoprolol tartrate  100 mg Oral BID  . multivitamin  1 tablet Oral QHS  . mupirocin ointment  1 application Nasal BID  . pantoprazole  40 mg Oral BID  . sucralfate  1 g Oral TID WC & HS

## 2020-03-15 NOTE — Interval H&P Note (Signed)
History and Physical Interval Note:  03/15/2020 10:25 AM  Patricia [name]  has presented today for surgery, with the diagnosis of ESRD.  The various methods of treatment have been discussed with the patient and family. After consideration of risks, benefits and other options for treatment, the patient has consented to  Procedure(s): RIGHT ARM ARTERIOVENOUS (AV) FISTULA CREATION (Right) as a surgical intervention.  The patient's history has been reviewed, patient examined, no change in status, stable for surgery.  I have reviewed the patient's chart and labs.  Questions were answered to the patient's satisfaction.     [name]

## 2020-03-15 NOTE — Progress Notes (Signed)
Physical Therapy Treatment Patient Details Name: Patricia [name] MRN: [id number] DOB: [birth year] Today's Date: 03/15/2020    History of Present Illness Pt is a 45 y/o female admitted 3/7 secondary to increased myoclonus, headache and chest pain. Found to be in hypertensive crisis. Pt underwent RUE AV fistula placement on 03/15/2020. PMH includes ESRD on peritoneal dialysis, HTN, myoclonus, CVA, anxiety, and chronic anemia.    PT Comments    Pt limited by pain in BUEs at this time after recent procedures. Pt declines ambulating away from the edge of bed and requires encouragement to participate in transfers. Pt is able to mobilize at the edge of bed without physical assistance, however does continue to demonstrate myoclonic jerking of eyes and some of LEs which could affect balance with further mobility. Pt will benefit from aggressive mobilization and PT POC to further assess gait training. PT recommends HHPT with assistance from family for all OOB mobility, as well as a RW and 3in1 commode. Pt will benefit from further gait assessment prior to discharge.  Follow Up Recommendations  Home health PT     Equipment Recommendations  Rolling walker with 5" wheels;3in1 (PT)    Recommendations for Other Services       Precautions / Restrictions Precautions Precautions: Fall Precaution Comments: myoclonic, improved. Restrictions Weight Bearing Restrictions: No    Mobility  Bed Mobility Overal bed mobility: Needs Assistance Bed Mobility: Supine to Sit;Sit to Supine     Supine to sit: Supervision Sit to supine: Supervision        Transfers Overall transfer level: Needs assistance Equipment used: None Transfers: Sit to/from Stand Sit to Stand: Min guard            Ambulation/Gait Ambulation/Gait assistance: Min guard Gait Distance (Feet): 2 Feet Assistive device: None Gait Pattern/deviations: Step-to pattern Gait velocity: reduced Gait velocity interpretation:  <1.31 ft/sec, indicative of household ambulator General Gait Details: pt side steps toward head of bed, refusing to ambulate away from bed at this time   Stairs             Wheelchair Mobility    Modified Rankin (Stroke Patients Only)       Balance Overall balance assessment: Needs assistance Sitting-balance support: No upper extremity supported;Feet supported Sitting balance-Leahy Scale: Good     Standing balance support: No upper extremity supported Standing balance-Leahy Scale: Fair                              Cognition Arousal/Alertness: Awake/alert Behavior During Therapy: Flat affect Overall Cognitive Status: Within Functional Limits for tasks assessed                                        Exercises      General Comments General comments (skin integrity, edema, etc.): VSS on RA, pt continues to demonstrate myocolonic movements of eyes and some of LEs during session which affect balance      Pertinent Vitals/Pain Pain Assessment: Faces Faces Pain Scale: Hurts even more Pain Location: soreness L shoulder and RUE Pain Descriptors / Indicators: Sore Pain Intervention(s): Monitored during session    Home Living                      Prior Function            PT Goals (current  goals can now be found in the care plan section) Acute Rehab PT Goals Patient Stated Goal: I need to be more steady. Progress towards PT goals: Progressing toward goals    Frequency    Min 3X/week      PT Plan Current plan remains appropriate    Co-evaluation              AM-PAC PT "6 Clicks" Mobility   Outcome Measure  Help needed turning from your back to your side while in a flat bed without using bedrails?: A Little Help needed moving from lying on your back to sitting on the side of a flat bed without using bedrails?: A Little Help needed moving to and from a bed to a chair (including a wheelchair)?: A Little Help  needed standing up from a chair using your arms (e.g., wheelchair or bedside chair)?: A Little Help needed to walk in hospital room?: A Lot Help needed climbing 3-5 steps with a railing? : Total 6 Click Score: 15    End of Session   Activity Tolerance: Patient limited by pain Patient left: in bed;with call bell/phone within reach;with bed alarm set Nurse Communication: Mobility status PT Visit Diagnosis: Unsteadiness on feet (R26.81);Muscle weakness (generalized) (M62.81);Other symptoms and signs involving the nervous system (R29.898)     Time: 1716-1730 PT Time Calculation (min) (ACUTE ONLY): 14 min  Charges:  $Therapeutic Activity: 8-22 mins                     Zenaida Niece, PT, DPT Acute Rehabilitation Pager: [phone]    Zenaida Niece 03/15/2020, 5:39 PM

## 2020-03-15 NOTE — Anesthesia Procedure Notes (Signed)
Procedure Name: MAC Date/Time: 03/15/2020 11:13 AM Performed by: Janace Litten, CRNA Pre-anesthesia Checklist: Patient identified, Emergency Drugs available, Suction available and Patient being monitored Patient Re-evaluated:Patient Re-evaluated prior to induction Oxygen Delivery Method: Simple face mask

## 2020-03-15 NOTE — Op Note (Signed)
    [name] MRN: [id number] DOB: [birth year] Sex: female  03/15/2020 Pre-operative Diagnosis: End-stage renal disease Post-operative diagnosis:  Same Surgeon:  [name] Assistants: Marlinda Mike Procedure:   First stage right basilic vein fistula creation Anesthesia: MAC and block Blood Loss: Minimal Specimens: None  Findings: The patient had a 4 mm basilic vein.  I did not use her cephalic vein because she came to the holding area with 2 IVs within her cephalic vein.  Indications: This is a 45 year old female in need of access for hemodialysis.  She has previously had procedures in her left arm and ultimately ended up with a steal requiring graft ligation, and so she is no longer a candidate for left arm access.  Procedure:  The patient was identified in the holding area and taken to [place] [facility]  The patient was then placed supine on the table. MAC and axillary block anesthesia was administered.  The patient was prepped and draped in the usual sterile fashion.  A time out was called and antibiotics were administered.  A PA was necessary to expedite the procedure and assist with technical details.  Ultrasound was used to evaluate the basilic vein which was widely patent and easily compressible and of adequate caliber.  I did not use her cephalic vein because she came to the holding area with 2 IVs in it and I was concerned about issues with bleeding, and the potential need to place sutures in the vein which could narrow the vein and compromise the fistula.  An oblique incision was made just proximal to the antecubital crease.  I first dissected out the brachial artery.  This was a 3 to 4 mm disease-free artery.  It was encircled proximally and distally with Vesseloops.  I then dissected out the basilic vein.  This was a healthy appearing 3 to 4 mm vein.  Side branches were ligated between silk ties.  The vein was marked for orientation and ligated distally.   The vein distended nicely with heparin saline.  Next baby Belenda Cruise clamps were placed on the brachial artery and a #11 blade was used to make an arteriotomy which was extended longitudinally with Potts scissors.  I intentionally made the arteriotomy smaller than normal because of her history of steal.  The vein was then cut the appropriate length and spatulated to fit the size of the arteriotomy.  A running anastomosis was created with 6-0 Prolene.  Prior to completion, the appropriate flushing maneuvers were performed and the anastomosis was completed.  I inspected the course of the vein to make sure there were no kinks and that it was without tension.  There was a brisk Doppler signal within the vein as well as triphasic radial and ulnar Doppler signals.  The wound was then irrigated.  Hemostasis was achieved.  The incision was closed with 2 layers of Vicryl followed by Dermabond.  There were no immediate complications.   Disposition: To PACU stable.   Theotis Burrow, M.D., [facility] [place] Office: [phone] Pager:  [phone]

## 2020-03-15 NOTE — Transfer of Care (Signed)
Immediate Anesthesia Transfer of Care Note  Patient: Patricia [name]  Procedure(s) Performed: RIGHT ARM ARTERIOVENOUS (AV) FISTULA CREATION 1st Stage Basilic Transposition (Right Arm Upper)  Patient Location: PACU  Anesthesia Type:MAC combined with regional for post-op pain  Level of Consciousness: awake, alert  and patient cooperative  Airway & Oxygen Therapy: Patient Spontanous Breathing  Post-op Assessment: Report given to RN and Post -op Vital signs reviewed and stable  Post vital signs: Reviewed and stable  Last Vitals:  Vitals Value Taken Time  BP    Temp    Pulse    Resp    SpO2      Last Pain:  Vitals:   03/15/20 1002  TempSrc: Oral  PainSc: 0-No pain      Patients Stated Pain Goal: 2 (16/58/00 6349)  Complications: No complications documented.

## 2020-03-15 NOTE — Care Management Important Message (Signed)
Important Message  Patient Details  Name: Patricia [name] MRN: [id number] Date of Birth: 08-12-1975   Medicare Important Message Given:  Yes     Shelda Altes 03/15/2020, 2:47 PM

## 2020-03-16 LAB — RENAL FUNCTION PANEL
Albumin: 2.9 g/dL — ABNORMAL LOW (ref 3.5–5.0)
Anion gap: 11 (ref 5–15)
BUN: 11 mg/dL (ref 6–20)
CO2: 26 mmol/L (ref 22–32)
Calcium: 10.2 mg/dL (ref 8.9–10.3)
Chloride: 98 mmol/L (ref 98–111)
Creatinine, Ser: 5.39 mg/dL — ABNORMAL HIGH (ref 0.44–1.00)
GFR, Estimated: 9 mL/min — ABNORMAL LOW (ref >60)
Glucose, Bld: 93 mg/dL (ref 70–99)
Phosphorus: 3.9 mg/dL (ref 2.5–4.6)
Potassium: 3.6 mmol/L (ref 3.5–5.1)
Sodium: 135 mmol/L (ref 135–145)

## 2020-03-16 MED ORDER — ISOSORBIDE DINITRATE 10 MG PO TABS
10.0000 mg | ORAL_TABLET | Freq: Three times a day (TID) | ORAL | Status: DC
  Administered 2020-03-16 – 2020-03-19 (×9): 10 mg via ORAL
  Filled 2020-03-16 (×10): qty 1

## 2020-03-16 NOTE — Progress Notes (Signed)
[name] Progress Note   Subjective: Seen in room. Myoclonus resolved.   Objective Vitals:   03/16/20 0008 03/16/20 0300 03/16/20 0442 03/16/20 0808  BP: (!) 196/90 (!) 176/84 (!) 192/90 (!) 198/94  Pulse: 79 81 79 70  Resp: $Remo'20 20 20 20  'CNYdH$ Temp: 98.5 F (36.9 C)  98.5 F (36.9 C) 98.4 F (36.9 C)  TempSrc: Oral  Oral Oral  SpO2: 97% 96% 99% 100%  Weight:   97.6 kg    Physical Exam General::Obese femalein no acute distress Heart: S1,S2 RRR. SR on monitor.  Lungs: CTAB Abdomen: Obese, active BS. PD catheter intact.  Extremities: No LE edema  Dialysis Access: PD catheter, drsg CDI. LIJ TDC Drsg CDI.  Neuro: minimal eyelid fluttering, no asterixis  Assessment/Plan: 1. Myoclonus: resolving, due to underdialysis on PD. Improved w/ transition to HD. Gen surg declined request to remove PD cath here,  this can be done in OP setting. Avoid gabapentin for 2-3 wks then can try at low dose if needed, < 300 mg / day.   2. Hypertensive Crisis/ hx severe HTN/ volume: on multiple meds including norvasc, hydralazine, metoprolol and lisinopril. Is not tolerating further UF, down 7kg from admit and no vol^ on exam. Cont medical Rx.  3. ESRD on PD: failed PD, needed HD for clearance of uremia. CLIP pt to OP HD. Next HD Monday.  4. Perm access: SP R 1st stg RUA BB AVF on 3/11.  5. Chest pain: resolved.  6. Anemia -HGB 7.8. Last ESA Mircera 225 mcg 03/11/2020. Fe 47 Tsat 21. Ferritin elevated but will load with Fe here.  Follow HGB. 7. Metabolic bone disease - Calcium 10.4. Not on VDRA. Continue binders. PO4 elevated.  Increase fosrenol to 1.5g PO TID AC. PO4 now at goal.  8. Nutrition - Renal diet with fld restrictions. Add prosource, renal vit 8. Medical Noncompliance-ongoing history of not taking meds. Needs assistance with home medications.    Kelly Splinter, MD 03/15/2020, 3:43 PM     Additional Objective Labs: Basic Metabolic Panel: Recent Labs  Lab  03/14/20 0657 03/15/20 0257 03/15/20 1025 03/16/20 0415  NA 135 137 138 135  K 3.7 3.8 3.5 3.6  CL 98 98 100 98  CO2 28 29  --  26  GLUCOSE 95 93 93 93  BUN 19 12 5* 11  CREATININE 8.19* 5.38* 3.00* 5.39*  CALCIUM 9.3 10.1  --  10.2  PHOS 4.8* 3.7  --  3.9   Liver Function Tests: Recent Labs  Lab 03/14/20 0657 03/15/20 0257 03/16/20 0415  ALBUMIN 2.6* 2.9* 2.9*   No results for input(s): LIPASE, AMYLASE in the last 168 hours. CBC: Recent Labs  Lab 03/11/20 1430 03/12/20 0446 03/13/20 0357 03/14/20 0657 03/15/20 0257 03/15/20 1025  WBC 7.6 7.2 8.0 7.0 8.9  --   HGB 8.9* 7.6* 7.5* 7.8* 8.8* 10.9*  HCT 28.5* 23.8* 23.5* 24.9* 28.6* 32.0*  MCV 89.9 88.5 87.0 88.3 89.1  --   PLT 303 256 308 263 291  --    Blood Culture    Component Value Date/Time   SDES BLOOD RIGHT HAND 08/23/2018 2120   SPECREQUEST  08/23/2018 2120    BOTTLES DRAWN AEROBIC ONLY Blood Culture adequate volume   CULT  08/23/2018 2120    NO GROWTH 5 DAYS Performed at [facility] [address] [place],  [address]    REPTSTATUS 08/28/2018 FINAL 08/23/2018 2120    Cardiac Enzymes: No results for input(s): CKTOTAL,  CKMB, CKMBINDEX, TROPONINI in the last 168 hours. CBG: No results for input(s): GLUCAP in the last 168 hours. Iron Studies:  No results for input(s): IRON, TIBC, TRANSFERRIN, FERRITIN in the last 72 hours. $RemoveB'@lablastinr3'firgxpLz$ @ Studies/Results: No results found. Medications: . sodium chloride    . [START ON 03/18/2020] ferric gluconate (FERRLECIT/NULECIT) IV     . (feeding supplement) PROSource Plus  30 mL Oral BID BM  . allopurinol  100 mg Oral QHS  . amLODipine  10 mg Oral Daily  . atorvastatin  40 mg Oral q1800  . Chlorhexidine Gluconate Cloth  6 each Topical Daily  . feeding supplement (NEPRO CARB STEADY)  237 mL Oral TID BM  . gentamicin ointment  1 application Topical Daily  . heparin  5,000 Units Subcutaneous Q8H  . hydrALAZINE  100 mg Oral Q8H  . isosorbide  dinitrate  10 mg Oral TID  . lanthanum  1,500 mg Oral TID with meals  . lisinopril  20 mg Oral QHS  . metoprolol tartrate  100 mg Oral BID  . multivitamin  1 tablet Oral QHS  . mupirocin ointment  1 application Nasal BID  . pantoprazole  40 mg Oral BID  . sucralfate  1 g Oral TID WC & HS

## 2020-03-16 NOTE — Progress Notes (Signed)
Physical Therapy Treatment Patient Details Name: Patricia [name] MRN: [id number] DOB: 1975/05/14 Today's Date: 03/16/2020    History of Present Illness Pt is a 45 y/o female admitted 3/7 secondary to increased myoclonus, headache and chest pain. Found to be in hypertensive crisis. Pt underwent RUE AV fistula placement on 03/15/2020. PMH includes ESRD on peritoneal dialysis, HTN, myoclonus, CVA, anxiety, and chronic anemia.    PT Comments    Pt with improved tolerance for ambulation this session, however remains limited by orthostatic BP and reports of dizziness. Pt is able to ambulate with UE support of RW at this time, demonstrating no LOB this session. Pt does continue to demonstrate myoclonic movements of eyes but not of extremities this session. Pt will continue to benefit from aggressive mobilization during this admission. PT encourages the patient to sit in recliner multiple times a day however patient declines at this time. PT continues to recommend discharge home with HHPT and a RW.   Follow Up Recommendations  Home health PT     Equipment Recommendations  Rolling walker with 5" wheels;3in1 (PT)    Recommendations for Other Services       Precautions / Restrictions Precautions Precautions: Fall Precaution Comments: myoclonic, monitor HTN Restrictions Weight Bearing Restrictions: No    Mobility  Bed Mobility Overal bed mobility: Modified Independent Bed Mobility: Supine to Sit;Sit to Supine     Supine to sit: Modified independent (Device/Increase time) Sit to supine: Modified independent (Device/Increase time)        Transfers Overall transfer level: Needs assistance Equipment used: None Transfers: Sit to/from Stand Sit to Stand: Supervision            Ambulation/Gait Ambulation/Gait assistance: Supervision Gait Distance (Feet): 40 Feet (10' without RW) Assistive device: Rolling walker (2 wheeled);None Gait Pattern/deviations: Step-to  pattern;Step-through pattern Gait velocity: reduced Gait velocity interpretation: <1.31 ft/sec, indicative of household ambulator General Gait Details: short step-to gait without device, steady step-through gait with use of RW   Stairs             Wheelchair Mobility    Modified Rankin (Stroke Patients Only)       Balance Overall balance assessment: Needs assistance Sitting-balance support: No upper extremity supported;Feet supported Sitting balance-Leahy Scale: Good     Standing balance support: No upper extremity supported Standing balance-Leahy Scale: Fair                              Cognition Arousal/Alertness: Awake/alert Behavior During Therapy: Flat affect Overall Cognitive Status: Within Functional Limits for tasks assessed                                        Exercises      General Comments General comments (skin integrity, edema, etc.): pt hypertensive at start of session, 208/95, BP drops to 174/88 upon completion of mobility. Pt does reports dizziness during ambulation.      Pertinent Vitals/Pain Pain Assessment: Faces Faces Pain Scale: Hurts little more Pain Location: generalized Pain Descriptors / Indicators: Grimacing Pain Intervention(s): Monitored during session    Home Living                      Prior Function            PT Goals (current goals can now be found in the  care plan section) Acute Rehab PT Goals Patient Stated Goal: I need to be more steady. Progress towards PT goals: Progressing toward goals    Frequency    Min 3X/week      PT Plan Current plan remains appropriate    Co-evaluation              AM-PAC PT "6 Clicks" Mobility   Outcome Measure  Help needed turning from your back to your side while in a flat bed without using bedrails?: A Little Help needed moving from lying on your back to sitting on the side of a flat bed without using bedrails?: A Little Help  needed moving to and from a bed to a chair (including a wheelchair)?: A Little Help needed standing up from a chair using your arms (e.g., wheelchair or bedside chair)?: A Little Help needed to walk in hospital room?: A Little Help needed climbing 3-5 steps with a railing? : A Lot 6 Click Score: 17    End of Session   Activity Tolerance: Patient limited by fatigue;Treatment limited secondary to medical complications (Comment) (orthostatic) Patient left: in bed;with call bell/phone within reach;with bed alarm set Nurse Communication: Mobility status PT Visit Diagnosis: Unsteadiness on feet (R26.81);Muscle weakness (generalized) (M62.81);Other symptoms and signs involving the nervous system (R29.898)     Time: 7322-5672 PT Time Calculation (min) (ACUTE ONLY): 16 min  Charges:  $Gait Training: 8-22 mins                     Zenaida Niece, PT, DPT Acute Rehabilitation Pager: [phone]    Zenaida Niece 03/16/2020, 11:42 AM

## 2020-03-16 NOTE — Progress Notes (Signed)
    Subjective  - POD #1, s/p right 1st stage BVT  No complaints   Physical Exam:  Palpable thrill in aff Palpable right radial pusle       Assessment/Plan:  POD #1  No signs of steal Fistula patent F/u 4-6 weeks at [facility]  [place] Aariya Ferrick 03/16/2020 9:25 AM --  Vitals:   03/16/20 0442 03/16/20 0808  BP: (!) 192/90 (!) 198/94  Pulse: 79 70  Resp: 20 20  Temp: 98.5 F (36.9 C) 98.4 F (36.9 C)  SpO2: 99% 100%    Intake/Output Summary (Last 24 hours) at 03/16/2020 0925 Last data filed at 03/15/2020 1330 Gross per 24 hour  Intake 225 ml  Output 1362 ml  Net -1137 ml     Laboratory CBC    Component Value Date/Time   WBC 8.9 03/15/2020 0257   HGB 10.9 (L) 03/15/2020 1025   HCT 32.0 (L) 03/15/2020 1025   HCT 27.5 (L) 04/05/2018 0943   PLT 291 03/15/2020 0257    BMET    Component Value Date/Time   NA 135 03/16/2020 0415   K 3.6 03/16/2020 0415   CL 98 03/16/2020 0415   CO2 26 03/16/2020 0415   GLUCOSE 93 03/16/2020 0415   BUN 11 03/16/2020 0415   CREATININE 5.39 (H) 03/16/2020 0415   CALCIUM 10.2 03/16/2020 0415   GFRNONAA 9 (L) 03/16/2020 0415   GFRAA 4 (L) 05/14/2019 2254    COAG Lab Results  Component Value Date   INR 1.1 05/11/2019   INR 1.07 02/26/2018   INR 0.98 12/22/2017   No results found for: PTT  Antibiotics Anti-infectives (From admission, onward)   Start     Dose/Rate Route Frequency Ordered Stop   03/13/20 1108  ceFAZolin (ANCEF) 2-4 GM/100ML-% IVPB       Note to Pharmacy: Barrett Henle   : cabinet override      03/13/20 1108 03/13/20 1150       V. Leia Alf, M.D., [facility] [place] Office: [phone] Pager:  [phone]

## 2020-03-16 NOTE — Progress Notes (Signed)
PROGRESS NOTE    Patricia [name]  VXB:[id number] DOB: April 18, 1975 DOA: 03/11/2020 PCP: Doyle Askew, PA-C    Brief Narrative:  Patricia [name] was admitted to the hospitalwith theworking diagnosis of hypertensive emergencyin the setting of hypervolemia and uremia related to ESRD.  45 year old female past medical history for end-stage renal disease on peritoneal dialysis, hypertension, chronic anemia, myoclonia and anxiety who presented with headache, chest pain and increased jerking movements. At home patient developed constant chest pain, nonradiating, associated myoclonus and headaches. On her initial physical examination blood pressure 210/82, heart rate 62, respirate 23, oxygen saturation 97%,her lungs were clear to auscultation bilaterally, heart S1-S2, present rhythmic, her abdomen was nondistended, no lower extremity edema. She had rapid periocular muscle twitching and eye deviation. Positive myoclonia.  Patient was placed on nitroglycerin drip with improvement of blood pressure. Dose of alprazolam was reduced.  Peritoneal dialysis was continued during her hospital stay,but continue patient to have myoclonic movements, considered to be uremic in nature.  03/09patient underwent tunneled HD catheter placement left IJ. Myoclonus has been improving with hemodialysis, plan to continue with HD as outpatient. Vascular surgery has been consulted for AV fistula creation.    Assessment & Plan:   Principal Problem:   Hypertensive crisis without congestive heart failure Active Problems:   Normocytic anemia   ESRD on peritoneal dialysis (HCC)   Chest pain   Myoclonia   Malnutrition of moderate degree    1. Hypertensive emergency.Blood pressure continue to be not well controlled. 194/94 mmHg this am.  Add isosorbide dinitrate 10 mg tid, continue with hydralazine 100 mg tid, lisinopril 20 mg daily, metoprolol 100 mg bid and amlodipine 10 mg daily.  Plan for  HD with ultrafiltration in 48 hrs on Monday.  Continue close blood pressure monitoring, out of bed to chair tid with meals.   2. ESRD on PD/ hypokalemia.clinically euvolemic, K is 3,6 and bicarbonate at 26.P 3.9 Has a new right upper extremity fistula created. PD catheter will be removed as outpatient.  Continue with fosrenol.   3. Anemia of chronic renal disease.On IV iron therapy. Call count is stable.   4. Anxiety.On PRNalprazolam.   5. Myoclonic movements. Outpatient workup as outpatient has been negative. Frequency and intensity of jerking movements have improved but not yet resolved.   Further work up as outpatient.   6. Moderate calorie protein malnutrition.On nutritional supplementation.   7. Dyslipidemia.Onatorvastatin  8. Gout. No exacerbation, continue with allopurinol   9. GERD. Continue with sucralfate.   Status is: Inpatient  Remains inpatient appropriate because:Inpatient level of care appropriate due to severity of illness   Dispo: The patient is from: Home              Anticipated d/c is to: Home              Patient currently is not medically stable to d/c.   Difficult to place patient No   DVT prophylaxis: Heparin   Code Status:   full  Family Communication:  No family at the bedside      Nutrition Status: Nutrition Problem: Moderate Malnutrition Etiology: chronic illness (ESRD) Signs/Symptoms: mild muscle depletion,mild fat depletion,energy intake < 75% for > or equal to 1 month Interventions: MVI,Nepro shake    Consultants:   Nephrology     Procedures:   HD catheter  AV fistula creation right upper extremity      Subjective: Patient is feeling better but not yet back to baseline, continue to have uncontrolled HTN,  no nausea or vomiting,   Objective: Vitals:   03/16/20 0008 03/16/20 0300 03/16/20 0442 03/16/20 0808  BP: (!) 196/90 (!) 176/84 (!) 192/90 (!) 198/94  Pulse: 79 81 79 70  Resp: $Remo'20 20 20 20  'UIwgx$ Temp:  98.5 F (36.9 C)  98.5 F (36.9 C) 98.4 F (36.9 C)  TempSrc: Oral  Oral Oral  SpO2: 97% 96% 99% 100%  Weight:   97.6 kg     Intake/Output Summary (Last 24 hours) at 03/16/2020 0943 Last data filed at 03/15/2020 1330 Gross per 24 hour  Intake 225 ml  Output 1362 ml  Net -1137 ml   Filed Weights   03/15/20 0700 03/15/20 1002 03/16/20 0442  Weight: 98.5 kg 97.2 kg 97.6 kg    Examination:   General: Not in pain or dyspnea, deconditioned  Neurology: Awake and alert, non focal. Facial twitching has improved but not yet resolved.  E ENT: no pallor, no icterus, oral mucosa moist Cardiovascular: No JVD. S1-S2 present, rhythmic, no gallops, rubs, or murmurs. trace lower extremity edema. Pulmonary: positive breath sounds bilaterally, adequate air movement, no wheezing, rhonchi or rales. Gastrointestinal. Abdomen soft and non tender Skin. No rashes Musculoskeletal: no joint deformities     Data Reviewed: I have personally reviewed following labs and imaging studies  CBC: Recent Labs  Lab 03/11/20 1430 03/12/20 0446 03/13/20 0357 03/14/20 0657 03/15/20 0257 03/15/20 1025  WBC 7.6 7.2 8.0 7.0 8.9  --   HGB 8.9* 7.6* 7.5* 7.8* 8.8* 10.9*  HCT 28.5* 23.8* 23.5* 24.9* 28.6* 32.0*  MCV 89.9 88.5 87.0 88.3 89.1  --   PLT 303 256 308 263 291  --    Basic Metabolic Panel: Recent Labs  Lab 03/12/20 0446 03/13/20 0357 03/14/20 0657 03/15/20 0257 03/15/20 1025 03/16/20 0415  NA 136 134* 135 137 138 135  K 3.6 3.2* 3.7 3.8 3.5 3.6  CL 96* 92* 98 98 100 98  CO2 $Re'23 25 28 29  'XTe$ --  26  GLUCOSE 104* 137* 95 93 93 93  BUN 38* 37* 19 12 5* 11  CREATININE 12.85* 12.28* 8.19* 5.38* 3.00* 5.39*  CALCIUM 10.0 9.5 9.3 10.1  --  10.2  PHOS  --  7.0* 4.8* 3.7  --  3.9   GFR: Estimated Creatinine Clearance: 15.6 mL/min (A) (by C-G formula based on SCr of 5.39 mg/dL (H)). Liver Function Tests: Recent Labs  Lab 03/13/20 0357 03/14/20 0657 03/15/20 0257 03/16/20 0415  ALBUMIN 2.9*  2.6* 2.9* 2.9*   No results for input(s): LIPASE, AMYLASE in the last 168 hours. No results for input(s): AMMONIA in the last 168 hours. Coagulation Profile: No results for input(s): INR, PROTIME in the last 168 hours. Cardiac Enzymes: No results for input(s): CKTOTAL, CKMB, CKMBINDEX, TROPONINI in the last 168 hours. BNP (last 3 results) No results for input(s): PROBNP in the last 8760 hours. HbA1C: No results for input(s): HGBA1C in the last 72 hours. CBG: No results for input(s): GLUCAP in the last 168 hours. Lipid Profile: No results for input(s): CHOL, HDL, LDLCALC, TRIG, CHOLHDL, LDLDIRECT in the last 72 hours. Thyroid Function Tests: No results for input(s): TSH, T4TOTAL, FREET4, T3FREE, THYROIDAB in the last 72 hours. Anemia Panel: No results for input(s): VITAMINB12, FOLATE, FERRITIN, TIBC, IRON, RETICCTPCT in the last 72 hours.    Radiology Studies: I have reviewed all of the imaging during this hospital visit personally     Scheduled Meds: . (feeding supplement) PROSource Plus  30 mL Oral BID BM  .  allopurinol  100 mg Oral QHS  . amLODipine  10 mg Oral Daily  . atorvastatin  40 mg Oral q1800  . Chlorhexidine Gluconate Cloth  6 each Topical Daily  . feeding supplement (NEPRO CARB STEADY)  237 mL Oral TID BM  . gentamicin ointment  1 application Topical Daily  . heparin  5,000 Units Subcutaneous Q8H  . hydrALAZINE  100 mg Oral Q8H  . isosorbide dinitrate  10 mg Oral TID  . lanthanum  1,500 mg Oral TID with meals  . lisinopril  20 mg Oral QHS  . metoprolol tartrate  100 mg Oral BID  . multivitamin  1 tablet Oral QHS  . mupirocin ointment  1 application Nasal BID  . pantoprazole  40 mg Oral BID  . sucralfate  1 g Oral TID WC & HS   Continuous Infusions: . sodium chloride    . [START ON 03/18/2020] ferric gluconate (FERRLECIT/NULECIT) IV       LOS: 5 days        Patricia [name] Gerome Apley, MD

## 2020-03-17 LAB — RENAL FUNCTION PANEL
Albumin: 2.8 g/dL — ABNORMAL LOW (ref 3.5–5.0)
Anion gap: 11 (ref 5–15)
BUN: 22 mg/dL — ABNORMAL HIGH (ref 6–20)
CO2: 26 mmol/L (ref 22–32)
Calcium: 10.3 mg/dL (ref 8.9–10.3)
Chloride: 99 mmol/L (ref 98–111)
Creatinine, Ser: 7.88 mg/dL — ABNORMAL HIGH (ref 0.44–1.00)
GFR, Estimated: 6 mL/min — ABNORMAL LOW (ref >60)
Glucose, Bld: 104 mg/dL — ABNORMAL HIGH (ref 70–99)
Phosphorus: 4.1 mg/dL (ref 2.5–4.6)
Potassium: 3.7 mmol/L (ref 3.5–5.1)
Sodium: 136 mmol/L (ref 135–145)

## 2020-03-17 MED ORDER — ONDANSETRON HCL 4 MG PO TABS
4.0000 mg | ORAL_TABLET | Freq: Three times a day (TID) | ORAL | Status: DC
  Administered 2020-03-17 – 2020-03-19 (×6): 4 mg via ORAL
  Filled 2020-03-17 (×6): qty 1

## 2020-03-17 MED ORDER — ASPIRIN EC 81 MG PO TBEC: 81.0000 mg | DELAYED_RELEASE_TABLET | Freq: Every day | ORAL | Status: DC

## 2020-03-17 NOTE — Progress Notes (Signed)
PROGRESS NOTE    Tanasia Budzinski  EHO:[id number] DOB: [birth year] DOA: 03/11/2020 PCP: Doyle Askew, PA-C    Brief Narrative:  Mrs. Paulick was admitted to the hospitalwith theworking diagnosis of hypertensive emergencyin the setting of hypervolemia and uremia related to ESRD.  45 year old female past medical history for end-stage renal disease on peritoneal dialysis, hypertension, chronic anemia, myoclonia and anxiety who presented with headache, chest pain and increased jerking movements. At home patient developed constant chest pain, nonradiating, associated myoclonus and headaches. On her initial physical examination blood pressure 210/82, heart rate 62, respirate 23, oxygen saturation 97%,her lungs were clear to auscultation bilaterally, heart S1-S2, present rhythmic, her abdomen was nondistended, no lower extremity edema. She had rapid periocular muscle twitching and eye deviation. Positive myoclonia.  Patient was placed on nitroglycerin drip with improvement of blood pressure. Dose of alprazolam was reduced.  Peritoneal dialysis was continued during her hospital stay,but continue patient to have myoclonic movements, considered to be uremic in nature.  03/09patient underwent tunneled HD catheter placement left IJ. Myoclonus has been improving with hemodialysis, plan to continue with HD as outpatient. Vascular surgery has been consulted for AV fistula creation.   Assessment & Plan:   Principal Problem:   Hypertensive crisis without congestive heart failure Active Problems:   Normocytic anemia   ESRD on peritoneal dialysis (HCC)   Chest pain   Myoclonia   Malnutrition of moderate degree   1. Hypertensive emergency. Improved blood pressure 164/91 this pm.   Continue blood pressure control with: isosorbide dinitrate 10 mg tid,  hydralazine 100 mg tid lisinopril 20 mg daily metoprolol 100 mg bid  amlodipine 10 mg daily.   Plan for HD with  ultrafiltration on Monday.    2. ESRD on PD/ hypokalemia. Stable volume today, K is 3,7 and bicarbonate is 26 Right upper extremity fistula created. Plan for PD catheter to be removed as outpatient.  On fosrenol.  Positive nausea today, will add zofran pre-meal.  Myoclonic movement are improving but no resolved.   3. Anemia of chronic renal disease.Continue withIV iron therapy.   4. Anxiety.Continue with PRNalprazolam.   5. Myoclonic movements. Outpatient workup as outpatient has been negative. Frequency and intensity of jerking movements have improved but not yet resolved.  Plan for follow up with Neurology as outpatient.   6. Moderate calorie protein malnutrition.Continue with nutritional supplementation.   7. Dyslipidemia.Continue withatorvastatin  8. Gout. No signs of acute exacerbation, on allopurinol   9. GERD. On sucralfate.    Status is: Inpatient  Remains inpatient appropriate because:Inpatient level of care appropriate due to severity of illness   Dispo: The patient is from: Home              Anticipated d/c is to: Home              Patient currently is not medically stable to d/c. plan for possible dc home tomorrow after HD   Difficult to place patient No   DVT prophylaxis: Heparin   Code Status:   full  Family Communication:  I spoke with patient's daughter at the bedside, we talked in detail about patient's condition, plan of care and prognosis and all questions were addressed.      Nutrition Status: Nutrition Problem: Moderate Malnutrition Etiology: chronic illness (ESRD) Signs/Symptoms: mild muscle depletion,mild fat depletion,energy intake < 75% for > or equal to 1 month Interventions: MVI,Nepro shake     Skin Documentation:     Consultants:   Nephrology  Procedures:   HD catheter tunneled  Right upper extremity AV fistula      Subjective: Patient is feeling better, twitching movement are improved but not yet  back to baseline, positive nausea with meals, no chest pain or dyspnea,   Objective: Vitals:   03/17/20 0430 03/17/20 0852 03/17/20 0907 03/17/20 1212  BP: (!) 144/85 (!) 173/96 (!) 185/83 (!) 164/91  Pulse: 76 82  75  Resp: 18   (!) 22  Temp: 98.2 F (36.8 C) 98.4 F (36.9 C)  98.8 F (37.1 C)  TempSrc: Oral Oral  Oral  SpO2: 97%   100%  Weight:        Intake/Output Summary (Last 24 hours) at 03/17/2020 1257 Last data filed at 03/16/2020 1923 Gross per 24 hour  Intake 120 ml  Output --  Net 120 ml   Filed Weights   03/15/20 1002 03/16/20 0442 03/17/20 0037  Weight: 97.2 kg 97.6 kg 97.3 kg    Examination:   General: Not in pain or dyspnea,  Neurology: Awake and alert, non focal, facial twitching has improved, but continue to have non voluntary eye movements.  E ENT: mild pallor, no icterus, oral mucosa moist Cardiovascular: No JVD. S1-S2 present, rhythmic, no gallops, rubs, or murmurs. No lower extremity edema. Pulmonary: positive breath sounds bilaterally, adequate air movement, no wheezing, rhonchi or rales. Gastrointestinal. Abdomen soft and non tender Skin. No rashes Musculoskeletal: no joint deformities     Data Reviewed: I have personally reviewed following labs and imaging studies  CBC: Recent Labs  Lab 03/11/20 1430 03/12/20 0446 03/13/20 0357 03/14/20 0657 03/15/20 0257 03/15/20 1025  WBC 7.6 7.2 8.0 7.0 8.9  --   HGB 8.9* 7.6* 7.5* 7.8* 8.8* 10.9*  HCT 28.5* 23.8* 23.5* 24.9* 28.6* 32.0*  MCV 89.9 88.5 87.0 88.3 89.1  --   PLT 303 256 308 263 291  --    Basic Metabolic Panel: Recent Labs  Lab 03/13/20 0357 03/14/20 0657 03/15/20 0257 03/15/20 1025 03/16/20 0415 03/17/20 0532  NA 134* 135 137 138 135 136  K 3.2* 3.7 3.8 3.5 3.6 3.7  CL 92* 98 98 100 98 99  CO2 $Re'25 28 29  'wRa$ --  26 26  GLUCOSE 137* 95 93 93 93 104*  BUN 37* 19 12 5* 11 22*  CREATININE 12.28* 8.19* 5.38* 3.00* 5.39* 7.88*  CALCIUM 9.5 9.3 10.1  --  10.2 10.3  PHOS 7.0*  4.8* 3.7  --  3.9 4.1   GFR: Estimated Creatinine Clearance: 10.7 mL/min (A) (by C-G formula based on SCr of 7.88 mg/dL (H)). Liver Function Tests: Recent Labs  Lab 03/13/20 0357 03/14/20 0657 03/15/20 0257 03/16/20 0415 03/17/20 0532  ALBUMIN 2.9* 2.6* 2.9* 2.9* 2.8*   No results for input(s): LIPASE, AMYLASE in the last 168 hours. No results for input(s): AMMONIA in the last 168 hours. Coagulation Profile: No results for input(s): INR, PROTIME in the last 168 hours. Cardiac Enzymes: No results for input(s): CKTOTAL, CKMB, CKMBINDEX, TROPONINI in the last 168 hours. BNP (last 3 results) No results for input(s): PROBNP in the last 8760 hours. HbA1C: No results for input(s): HGBA1C in the last 72 hours. CBG: No results for input(s): GLUCAP in the last 168 hours. Lipid Profile: No results for input(s): CHOL, HDL, LDLCALC, TRIG, CHOLHDL, LDLDIRECT in the last 72 hours. Thyroid Function Tests: No results for input(s): TSH, T4TOTAL, FREET4, T3FREE, THYROIDAB in the last 72 hours. Anemia Panel: No results for input(s): VITAMINB12, FOLATE, FERRITIN, TIBC, IRON,  RETICCTPCT in the last 72 hours.    Radiology Studies: I have reviewed all of the imaging during this hospital visit personally     Scheduled Meds: . (feeding supplement) PROSource Plus  30 mL Oral BID BM  . allopurinol  100 mg Oral QHS  . amLODipine  10 mg Oral Daily  . atorvastatin  40 mg Oral q1800  . Chlorhexidine Gluconate Cloth  6 each Topical Daily  . feeding supplement (NEPRO CARB STEADY)  237 mL Oral TID BM  . gentamicin ointment  1 application Topical Daily  . heparin  5,000 Units Subcutaneous Q8H  . hydrALAZINE  100 mg Oral Q8H  . isosorbide dinitrate  10 mg Oral TID  . lanthanum  1,500 mg Oral TID with meals  . lisinopril  20 mg Oral QHS  . metoprolol tartrate  100 mg Oral BID  . multivitamin  1 tablet Oral QHS  . mupirocin ointment  1 application Nasal BID  . pantoprazole  40 mg Oral BID  .  sucralfate  1 g Oral TID WC & HS   Continuous Infusions: . sodium chloride    . [START ON 03/18/2020] ferric gluconate (FERRLECIT/NULECIT) IV       LOS: 6 days        Mccabe Gloria Gerome Apley, MD

## 2020-03-17 NOTE — Progress Notes (Addendum)
[facility] Progress Note   Subjective: seen in room. Labs stable.   Objective Vitals:   03/17/20 0037 03/17/20 0430 03/17/20 0852 03/17/20 0907  BP: (!) 165/84 (!) 144/85 (!) 173/96 (!) 185/83  Pulse: 78 76 82   Resp: 16 18    Temp: 98.2 F (36.8 C) 98.2 F (36.8 C) 98.4 F (36.9 C)   TempSrc:  Oral Oral   SpO2: 97% 97%    Weight: 97.3 kg      Physical Exam General::Obese femalein no acute distress Heart: S1,S2 RRR. SR on monitor.  Lungs: CTAB Abdomen: Obese, active BS. PD catheter intact.  Extremities: No LE edema  Dialysis Access: PD catheter, drsg CDI. LIJ TDC Drsg CDI.  Neuro: minimal eyelid fluttering, no asterixis  Assessment/Plan: 1. 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 Resolved w/ transition to HD.  Avoid gabapentin for 2-3 wks then can try at low dose if needed, < 300 mg total per day.   2. Hypertensive Crisis/ hx severe HTN/ volume: on multiple meds including norvasc, hydralazine, metoprolol and lisinopril. Is not tolerating further UF, down 7kg from admit and no vol^ on exam. Will set new dry wt around 97.5kg. HTN not strictly vol dependent here so cont max medical Rx as well.  3. ESRD on PD: failed PD and needed HD for clearance of uremia. TDC placed by IR. Gen surg declined request to remove PD cath here,  this can be done in OP setting.  CLIP pt to OP HD pend. HD tomorrow here.  Per primary MD is ready for dc tomorrow, will consult HD SW in am and hopefully can be dc'd tomorrow.  4. Perm access: SP R 1st stg RUA BB AVF on 3/11. Per VVS.  5. Chest pain: resolved.  6. Anemia -HGB 7.8. Last ESA Mircera 225 mcg 03/11/2020. Fe 47 Tsat 21. Getting IV Fe w/ HD here.  Follow HGB. 7. Metabolic bone disease - Calcium 10.4. Not on VDRA. Continue binders. PO4 elevated.  Increase fosrenol to 1.5g PO TID AC. PO4 now at goal.  8. Nutrition - Renal diet with fld restrictions. Add prosource, renal vit 8. Medical Noncompliance-ongoing history of  not taking meds. Needs assistance with home medications.    Kelly Splinter, MD 03/15/2020, 3:43 PM     Additional Objective Labs: Basic Metabolic Panel: Recent Labs  Lab 03/15/20 0257 03/15/20 1025 03/16/20 0415 03/17/20 0532  NA 137 138 135 136  K 3.8 3.5 3.6 3.7  CL 98 100 98 99  CO2 29  --  26 26  GLUCOSE 93 93 93 104*  BUN 12 5* 11 22*  CREATININE 5.38* 3.00* 5.39* 7.88*  CALCIUM 10.1  --  10.2 10.3  PHOS 3.7  --  3.9 4.1   Liver Function Tests: Recent Labs  Lab 03/15/20 0257 03/16/20 0415 03/17/20 0532  ALBUMIN 2.9* 2.9* 2.8*   No results for input(s): LIPASE, AMYLASE in the last 168 hours. CBC: Recent Labs  Lab 03/11/20 1430 03/12/20 0446 03/13/20 0357 03/14/20 0657 03/15/20 0257 03/15/20 1025  WBC 7.6 7.2 8.0 7.0 8.9  --   HGB 8.9* 7.6* 7.5* 7.8* 8.8* 10.9*  HCT 28.5* 23.8* 23.5* 24.9* 28.6* 32.0*  MCV 89.9 88.5 87.0 88.3 89.1  --   PLT 303 256 308 263 291  --    Blood Culture    Component Value Date/Time   SDES BLOOD RIGHT HAND 08/23/2018 2120   SPECREQUEST  08/23/2018 2120    BOTTLES DRAWN AEROBIC ONLY Blood  Culture adequate volume   CULT  08/23/2018 2120    NO GROWTH 5 DAYS Performed at [facility], [place] [address] [place], [place] [address]    REPTSTATUS 08/28/2018 FINAL 08/23/2018 2120    Cardiac Enzymes: No results for input(s): CKTOTAL, CKMB, CKMBINDEX, TROPONINI in the last 168 hours. CBG: No results for input(s): GLUCAP in the last 168 hours. Iron Studies:  No results for input(s): IRON, TIBC, TRANSFERRIN, FERRITIN in the last 72 hours. $RemoveB'@lablastinr3'lWgWXYVn$ @ Studies/Results: No results found. Medications: . sodium chloride    . [START ON 03/18/2020] ferric gluconate (FERRLECIT/NULECIT) IV     . (feeding supplement) PROSource Plus  30 mL Oral BID BM  . allopurinol  100 mg Oral QHS  . amLODipine  10 mg Oral Daily  . atorvastatin  40 mg Oral q1800  . Chlorhexidine Gluconate Cloth  6 each Topical Daily  . feeding supplement  (NEPRO CARB STEADY)  237 mL Oral TID BM  . gentamicin ointment  1 application Topical Daily  . heparin  5,000 Units Subcutaneous Q8H  . hydrALAZINE  100 mg Oral Q8H  . isosorbide dinitrate  10 mg Oral TID  . lanthanum  1,500 mg Oral TID with meals  . lisinopril  20 mg Oral QHS  . metoprolol tartrate  100 mg Oral BID  . multivitamin  1 tablet Oral QHS  . mupirocin ointment  1 application Nasal BID  . pantoprazole  40 mg Oral BID  . sucralfate  1 g Oral TID WC & HS

## 2020-03-18 ENCOUNTER — Encounter (HOSPITAL_COMMUNITY): Payer: Self-pay | Admitting: Surgery

## 2020-03-18 LAB — BASIC METABOLIC PANEL
Anion gap: 14 (ref 5–15)
BUN: 30 mg/dL — ABNORMAL HIGH (ref 6–20)
CO2: 25 mmol/L (ref 22–32)
Calcium: 10.2 mg/dL (ref 8.9–10.3)
Chloride: 96 mmol/L — ABNORMAL LOW (ref 98–111)
Creatinine, Ser: 10.37 mg/dL — ABNORMAL HIGH (ref 0.44–1.00)
GFR, Estimated: 4 mL/min — ABNORMAL LOW (ref >60)
Glucose, Bld: 106 mg/dL — ABNORMAL HIGH (ref 70–99)
Potassium: 3.6 mmol/L (ref 3.5–5.1)
Sodium: 135 mmol/L (ref 135–145)

## 2020-03-18 MED ORDER — AMLODIPINE BESYLATE 10 MG PO TABS: 10.0000 mg | ORAL_TABLET | Freq: Every day | ORAL | 0 refills | Status: DC

## 2020-03-18 MED ORDER — ACETAMINOPHEN 325 MG PO TABS: 650.0000 mg | ORAL_TABLET | Freq: Four times a day (QID) | ORAL | Status: DC | PRN

## 2020-03-18 MED ORDER — NEPRO/CARBSTEADY PO LIQD: 237.0000 mL | Freq: Three times a day (TID) | ORAL | 0 refills | Status: AC

## 2020-03-18 MED ORDER — HEPARIN SODIUM (PORCINE) 1000 UNIT/ML IJ SOLN
INTRAMUSCULAR | Status: AC
  Administered 2020-03-18: 3800 [IU] via INTRAVENOUS_CENTRAL
  Filled 2020-03-18: qty 4

## 2020-03-18 MED ORDER — HYDRALAZINE HCL 100 MG PO TABS
100.0000 mg | ORAL_TABLET | Freq: Three times a day (TID) | ORAL | 0 refills | Status: DC
Start: 2020-03-18 — End: 2020-05-08

## 2020-03-18 MED ORDER — HEPARIN SODIUM (PORCINE) 1000 UNIT/ML IJ SOLN
INTRAMUSCULAR | Status: AC
  Administered 2020-03-18: 4000 [IU] via INTRAVENOUS_CENTRAL
  Filled 2020-03-18: qty 4

## 2020-03-18 MED ORDER — METOPROLOL TARTRATE 100 MG PO TABS: 100.0000 mg | ORAL_TABLET | Freq: Two times a day (BID) | ORAL | 0 refills | Status: DC

## 2020-03-18 MED ORDER — LISINOPRIL 20 MG PO TABS: 20.0000 mg | ORAL_TABLET | Freq: Every day | ORAL | 0 refills | Status: DC

## 2020-03-18 MED ORDER — ISOSORBIDE DINITRATE 10 MG PO TABS: 10.0000 mg | ORAL_TABLET | Freq: Three times a day (TID) | ORAL | 0 refills | Status: DC

## 2020-03-18 MED ORDER — PROSOURCE PLUS PO LIQD: 30.0000 mL | Freq: Two times a day (BID) | ORAL | 0 refills | Status: AC

## 2020-03-18 NOTE — Progress Notes (Signed)
Renal Navigator spoke with [facility] and [employer] Supervisor/I. Tacy Dura to inquire if Non-Medicaid transportation can transport patient to outpatient HD from [place] to [employer] ([place] to [facility]). She advised for Navigator to submit the transportation application and they will evaluate whether or not they can accommodate. Application completed with patient and submitted. Navigator spoke with patient at HD bedside to explain thought for her to have HD here today since we need to confirm transportation to [place] clinic or change her clinic to [facility] (if first shift seat is available) before allowing her to discharge. She is understanding and states she is willing to change clinics if necessary. Navigator explained the possibility of [place] transportation and asked her to speak with her daughter to see if she can transport her on Saturdays if we can arrane transportation T/Th. She agreed and thinks this will not be a problem. Navigator explained to patient that we need to confirm outpatient HD plan before she is discharged. She agreed. Navigator spoke with outpatient clinic to inform that patient will not need treatment there tomorrow (cleared by Dr. Justin Mend) and asked that patient be put on a waiting list for first shift. They agreed. Navigator has also updated attending and will continue to follow closely.   Alphonzo Cruise,  Renal Navigator [phone]

## 2020-03-18 NOTE — Progress Notes (Signed)
Patient has been given at TTS outpatient HD schedule with a seat time of 11:15am at [facility], which is where she dialyzed prior to PD. She needs to arrive at 10:55am. Attending states she can be discharged today, therefore, Navigator spoke with Nephrologist/Dr. Justin Mend, who confirmed that patient can discharge today without HD and go to outpatient seat tomorrow.  Navigator met with patient to inform of outpatient seat time and plan--she states she does not know how she is going to get to this new time. She reports her daughter took her to HD when her seat was early first shift (approximately 5:30am) the last time she did in-center HD because she was able to transport her before starting work (daughter works from home in [place]). Navigator will need to research transportation options from [facility] to [facility], however, not sure this is an option. Navigator is looking in to the possibility of changing patient's outpatient HD seat to a [place] clinic so that [place] transportation can be utilized, or there potentially could be an early seat that her daughter could transport to.  Navigator updated Attending and Nephrologist and we have decided to continue with plan for HD today as outpatient HD is currently not confirmed and we may not be able to discharge today because of this.   Alphonzo Cruise, [place] Renal Navigator [phone]

## 2020-03-18 NOTE — Anesthesia Postprocedure Evaluation (Signed)
Anesthesia Post Note  Patient: Patricia [name]  Procedure(s) Performed: RIGHT ARM ARTERIOVENOUS (AV) FISTULA CREATION 1st Stage Basilic Transposition (Right Arm Upper)     Patient location during evaluation: PACU Anesthesia Type: Regional and MAC Level of consciousness: awake and alert Pain management: pain level controlled Vital Signs Assessment: post-procedure vital signs reviewed and stable Respiratory status: spontaneous breathing, nonlabored ventilation, respiratory function stable and patient connected to nasal cannula oxygen Cardiovascular status: stable and blood pressure returned to baseline Postop Assessment: no apparent nausea or vomiting Anesthetic complications: no   No complications documented.  Last Vitals:  Vitals:   03/17/20 2023 03/18/20 0426  BP: (!) 157/93 (!) 173/92  Pulse: 86 83  Resp: 19 (!) 24  Temp: 36.8 C 37 C  SpO2: 100% 98%    Last Pain:  Vitals:   03/18/20 0426  TempSrc: Oral  PainSc:                  March Rummage [name]

## 2020-03-18 NOTE — Progress Notes (Signed)
Occupational Therapy Treatment Patient Details Name: Patricia [name] MRN: [id number] DOB: June 30, 1975 Today's Date: 03/18/2020    History of present illness Pt is a 45 y/o female admitted 3/7 secondary to increased myoclonus, headache and chest pain. Found to be in hypertensive crisis. Pt underwent RUE AV fistula placement on 03/15/2020. PMH includes ESRD on peritoneal dialysis, HTN, myoclonus, CVA(although this is in her chart, the patient reports she has never had a stroke--all MRI/CT her chart are normal), anxiety, and chronic anemia.   OT comments  This 45 yo female admitted with above presents to acute OT making progress with toileting transfers, toileting, grooming standing at sink. She did report she felt short of breath upon coming back from bathroom/standing at sink to groom--vital stable (HR91, sats 100 on RA, RR 15). Pt itching all over today--made RN aware. Pt will continue to benefit from acute OT with follow up [place].  Follow Up Recommendations  Home health OT;Supervision - Intermittent    Equipment Recommendations  None recommended by OT       Precautions / Restrictions Precautions Precautions: Fall Precaution Comments: myoclonic, monitor HTN Restrictions Weight Bearing Restrictions: No       Mobility Bed Mobility               General bed mobility comments: Pt sitting on side of bed when I arrived (having set off alarm and disconnected herself from telemetry to go to bathroom); left pt sitting EOB with bed alarm on    Transfers Overall transfer level: Needs assistance Equipment used: Rolling walker (2 wheeled) Transfers: Sit to/from Stand Sit to Stand: Min guard Stand pivot transfers: Min assist            Balance Overall balance assessment: Needs assistance Sitting-balance support: No upper extremity supported;Feet supported Sitting balance-Leahy Scale: Good     Standing balance support: No upper extremity supported;During functional  activity Standing balance-Leahy Scale: Fair Standing balance comment: standing at sink to wash hands                           ADL either performed or assessed with clinical judgement   ADL Overall ADL's : Needs assistance/impaired     Grooming: Wash/dry hands;Standing;Min guard               Lower Body Dressing: Min guard;Sit to/from stand   Toilet Transfer: Minimal assistance;Ambulation;Comfort height toilet;Grab bars   Toileting- Clothing Manipulation and Hygiene: Min guard;Sit to/from stand               Vision Patient Visual Report: No change from baseline Additional Comments: myoclonus          Cognition Arousal/Alertness: Awake/alert Behavior During Therapy: Flat affect Overall Cognitive Status: Within Functional Limits for tasks assessed                                                     Pertinent Vitals/ Pain       Pain Assessment: No/denies pain         Frequency  Min 2X/week        Progress Toward Goals  OT Goals(current goals can now be found in the care plan section)  Progress towards OT goals: Progressing toward goals  Acute Rehab OT Goals Patient Stated Goal: to not itch (called  front desk to ask them to ask RN about meds for this) OT Goal Formulation: With patient Time For Goal Achievement: 03/28/20 Potential to Achieve Goals: Good  Plan Discharge plan remains appropriate       AM-PAC OT "6 Clicks" Daily Activity     Outcome Measure   Help from another person eating meals?: None Help from another person taking care of personal grooming?: A Little Help from another person toileting, which includes using toliet, bedpan, or urinal?: A Little Help from another person bathing (including washing, rinsing, drying)?: A Little Help from another person to put on and taking off regular upper body clothing?: A Little Help from another person to put on and taking off regular lower body clothing?: A  Little 6 Click Score: 19    End of Session Equipment Utilized During Treatment: Rolling walker  OT Visit Diagnosis: Unsteadiness on feet (R26.81);Dizziness and giddiness (R42)   Activity Tolerance  (reported SOB after coming back from bathroom (vitals stable))   Patient Left in bed;with call bell/phone within reach;with bed alarm set   Nurse Communication          Time: 1042-1100 OT Time Calculation (min): 18 min  Charges: OT General Charges $OT Visit: 1 Visit OT Treatments $Self Care/Home Management : 8-22 mins  [place], OTR/L Acute [employer] Pager [phone] Office [phone]      Almon Register 03/18/2020, 11:11 AM

## 2020-03-18 NOTE — Discharge Summary (Addendum)
Physician Discharge Summary  Patricia [name] YVO:[id number] DOB: 1975-07-12 DOA: 03/11/2020  PCP: Doyle Askew, PA-C  Admit date: 03/11/2020 Discharge date: 03/19/2020  Admitted From: Home  Disposition:  Home   Recommendations for Outpatient Follow-up and new medication changes:  1. Follow up with [name] PA-C in 7 days.  2. Continue with HD, T, TH, Sat 3. Plan to remove HD catheter as outpatient   Home Health: no   Equipment/Devices: no    Discharge Condition: stable  CODE STATUS: full  Diet recommendation: heart healthy   Brief/Interim Summary: Patricia [name] was admitted to the hospitalwith theworking diagnosis of hypertensive emergencyin the setting of hypervolemia and uremia related to ESRD.  45 year old female past medical history for end-stage renal disease on peritoneal dialysis, hypertension, chronic anemia, myoclonia and anxiety who presented with headache, chest pain and increased jerking movements. At home patient developed constant chest pain, nonradiating, associated myoclonus and headaches. On her initial physical examination blood pressure 210/82, heart rate 62, respiratory rate 23, oxygen saturation 97%,her lungs were clear to auscultation bilaterally, heart S1-S2, present rhythmic, her abdomen was nondistended, no lower extremity edema. She had rapid periocular muscle twitching and eye deviation. Positive myoclonia.  Sodium 136, potassium 3.8, chloride 96, bicarb 22, glucose 107, BUN 32, creatinine 11.8, calcium 10.4, white count 7.6, hemoglobin 8.9, hematocrit 28.5, platelets 303, troponin I 24-21. SARS COVID-19 negative. Head CT negative for acute changes. Chest radiograph with right hemidiaphragm elevation, mild hilar vascular congestion.  EKG 64 bpm, normal axis, normal intervals, sinus rhythm, small Q waves lead II, lead III, aVF, V3-V6, no ST segment or T wave changes.  Patient was placed on nitroglycerin drip with improvement of  blood pressure. Dose of alprazolam was reduced and gabapentin was discontinued.  It was determined that patient was under dialyzed through peritoneal dialysis and decision was made to transition back to hemodialysis.  03/09patient underwent tunneled HD catheter placement left IJ. Myoclonus has been improving with hemodialysis, plan to continue with HD as outpatient. Vascular surgery was consulted and a right upper extremity AV fistula was created.   1.  Hypertensive emergency.  Patient underwent hemodialysis with ultrafiltration, her blood pressure remained difficult to control. She has required multiple antihypertensive agents. Currently she is on isosorbide, hydralazine, lisinopril, metoprolol and amlodipine. Follow-up as an outpatient within 7 days.  2.  End-stage renal disease on hemodialysis/hypokalemia.  It was determined that patient was under dialyzed through peritoneal dialysis.  Myoclonic/twitching movements of her face where so to be related to uremia.  IR was consulted and patient underwent tunneled HD catheter placement in the left IJ. She underwent hemodialysis with ultrafiltration with good toleration.  Her myoclonic movements have improved but not completely resolved.  Vascular surgery was consulted and patient underwent first stage right basilic vein fistula creation. Her peritoneal dialysis catheter will be removed as an outpatient.  3.  Anemia of chronic renal disease/iron deficiency.  Patient received intravenous iron with good toleration.  4.  Anxiety.  Continue as needed alprazolam.  5.  Myoclonic movements.  An outpatient work-up has been negative so far.  Her twitching movements have improved with hemodialysis but not completely resolved. Plan to follow-up as an outpatient with neurology.  6.  Moderate calorie protein malnutrition.  Continue attritional supplementation.  7.  Dyslipidemia.  Continue with atorvastatin.  8.  Gout.  No acute exacerbation,  continue allopurinol.  9.  GERD.  Continue sucralfate.   Discharge Diagnoses:  Principal Problem:   Hypertensive crisis without  congestive heart failure Active Problems:   Normocytic anemia   ESRD on peritoneal dialysis [facility])   Chest pain   Myoclonia   Malnutrition of moderate degree    Discharge Instructions  Discharge Instructions    Diet - low sodium heart healthy   Complete by: As directed    Increase activity slowly   Complete by: As directed    No wound care   Complete by: As directed      Allergies as of 03/19/2020      Reactions   Nsaids Other (See Comments)   Cannot take due to Kidney disease/Kidney function   Tolmetin Other (See Comments)   Cannot take due to Kidney Disease      Medication List    STOP taking these medications   calcitRIOL 0.5 MCG capsule Commonly known as: ROCALTROL   ferric citrate 1 GM 210 MG(Fe) tablet Commonly known as: AURYXIA   gabapentin 100 MG capsule Commonly known as: NEURONTIN   saccharomyces boulardii 250 MG capsule Commonly known as: FLORASTOR     TAKE these medications   acetaminophen 325 MG tablet Commonly known as: TYLENOL Take 2 tablets (650 mg total) by mouth every 6 (six) hours as needed for mild pain (or Fever >/= 101).   allopurinol 100 MG tablet Commonly known as: ZYLOPRIM Take 100 mg by mouth at bedtime.   ALPRAZolam 1 MG tablet Commonly known as: XANAX Take 1 mg by mouth 2 (two) times daily as needed for anxiety.   amLODipine 10 MG tablet Commonly known as: NORVASC Take 1 tablet (10 mg total) by mouth daily.   atorvastatin 40 MG tablet Commonly known as: LIPITOR Take 1 tablet (40 mg total) by mouth daily at 6 PM.   feeding supplement (NEPRO CARB STEADY) Liqd Take 237 mLs by mouth 3 (three) times daily between meals.   (feeding supplement) PROSource Plus liquid Take 30 mLs by mouth 2 (two) times daily between meals.   Fosrenol 1000 MG Pack Generic drug: Lanthanum Carbonate Take 1 packet  by mouth with breakfast, with lunch, and with evening meal.   hydrALAZINE 100 MG tablet Commonly known as: APRESOLINE Take 1 tablet (100 mg total) by mouth every 8 (eight) hours. What changed:   medication strength  how much to take  when to take this   hydrOXYzine 25 MG tablet Commonly known as: ATARAX/VISTARIL Take 25 mg by mouth every 6 (six) hours as needed for itching.   isosorbide dinitrate 10 MG tablet Commonly known as: ISORDIL Take 1 tablet (10 mg total) by mouth 3 (three) times daily.   lisinopril 20 MG tablet Commonly known as: ZESTRIL Take 1 tablet (20 mg total) by mouth at bedtime.   metoprolol tartrate 100 MG tablet Commonly known as: LOPRESSOR Take 1 tablet (100 mg total) by mouth 2 (two) times daily.   multivitamin Tabs tablet Take 1 tablet by mouth at bedtime.   omeprazole 40 MG capsule Commonly known as: PRILOSEC Take 40 mg by mouth at bedtime.   promethazine 25 MG tablet Commonly known as: PHENERGAN Take 1 tablet (25 mg total) by mouth every 8 (eight) hours as needed for nausea or vomiting.   zolpidem 5 MG tablet Commonly known as: AMBIEN Take 1 tablet (5 mg total) by mouth at bedtime as needed for sleep.            Durable Medical Equipment  (From admission, onward)         Start     Ordered  03/15/20 1557  For home use only DME Walker rolling  Once       Question Answer Comment  Walker: With 5 Inch Wheels   Patient needs a walker to treat with the following condition Weakness      03/15/20 1556   03/15/20 1557  For home use only DME 3 n 1  Once        03/15/20 1557          Follow-up Information    Serafina Mitchell, MD.   Specialties: Vascular Surgery, Cardiology Why: office will call you to arrange your appt (sent) Contact information: [address] [place] [address] [phone]              Allergies  Allergen Reactions  . Nsaids Other (See Comments)    Cannot take due to Kidney disease/Kidney function    . Tolmetin Other (See Comments)    Cannot take due to Kidney Disease    Consultations:  Nephrology  IR  Vascular surgery    Procedures/Studies: DG Chest 2 View  Result Date: 03/11/2020 CLINICAL DATA:  Left-sided chest pain, shortness of breath, weakness, and lightheadedness for 1 week. EXAM: CHEST - 2 VIEW COMPARISON:  Radiograph 10/03/2019, additional priors FINDINGS: Mild chronic eventration of right hemidiaphragm. Heart size upper normal. The cardiomediastinal contours are normal. Prominence of the right paratracheal stripe is likely relating to overlapping vascular structures, and has been intermittently present on radiographs. The lungs are clear. Pulmonary vasculature is normal. No consolidation, pleural effusion, or pneumothorax. No acute osseous abnormalities are seen. IMPRESSION: Borderline cardiomegaly.  No acute pulmonary process. Electronically Signed   By: Keith Rake M.D.   On: 03/11/2020 15:14   CT Head Wo Contrast  Result Date: 03/11/2020 CLINICAL DATA:  Headache and dizziness for 3 days. EXAM: CT HEAD WITHOUT CONTRAST TECHNIQUE: Contiguous axial images were obtained from the base of the skull through the vertex without intravenous contrast. COMPARISON:  Head CT 05/12/2019 and MRI 02/09/2020 FINDINGS: Brain: There is no evidence of an acute infarct, intracranial hemorrhage, mass, midline shift, or extra-axial fluid collection. The ventricles and sulci are normal. Vascular: Calcified atherosclerosis at the skull base. No hyperdense vessel. Skull: No fracture or suspicious osseous lesion. Sinuses/Orbits: Visualized paranasal sinuses and mastoid air cells are clear. Unremarkable orbits. Other: None. IMPRESSION: Unremarkable CT appearance of the brain. Electronically Signed   By: Logan Bores M.D.   On: 03/11/2020 17:53   IR Fluoro Guide CV Line Left  Result Date: 03/13/2020 INDICATION: 45 year old female in need of hemodialysis. She presents for placement of a tunneled  hemodialysis catheter. Evaluation of the right internal jugular vein demonstrates partial thrombosis. Therefore, we will proceed with a left-sided catheter placement. EXAM: TUNNELED CENTRAL VENOUS HEMODIALYSIS CATHETER PLACEMENT WITH ULTRASOUND AND FLUOROSCOPIC GUIDANCE MEDICATIONS: 5 mg metoprolol IV ANESTHESIA/SEDATION: Moderate (conscious) sedation was employed during this procedure. A total of Versed 1 mg and Fentanyl 100 mcg was administered intravenously. Moderate Sedation Time: 19 minutes. The patient's level of consciousness and vital signs were monitored continuously by radiology nursing throughout the procedure under my direct supervision. FLUOROSCOPY TIME:  Fluoroscopy Time: 0 minutes 30 seconds (2 mGy). COMPLICATIONS: None immediate. PROCEDURE: Informed written consent was obtained from the patient after a discussion of the risks, benefits, and alternatives to treatment. Questions regarding the procedure were encouraged and answered. The left neck and chest were prepped with chlorhexidine in a sterile fashion, and a sterile drape was applied covering the operative field. Maximum barrier sterile technique  with sterile gowns and gloves were used for the procedure. A timeout was performed prior to the initiation of the procedure. After creating a small venotomy incision, a micropuncture kit was utilized to access the left internal jugular vein under direct, real-time ultrasound guidance after the overlying soft tissues were anesthetized with 1% lidocaine with epinephrine. Ultrasound image documentation was performed. The microwire was kinked to measure appropriate catheter length. A stiff Glidewire was advanced to the level of the IVC and the micropuncture sheath was exchanged for a peel-away sheath. A palindrome tunneled hemodialysis catheter measuring 23 cm from tip to cuff was tunneled in a retrograde fashion from the anterior chest wall to the venotomy incision. The catheter was then placed through the  peel-away sheath with tips ultimately positioned within the superior aspect of the right atrium. Final catheter positioning was confirmed and documented with a spot radiographic image. The catheter aspirates and flushes normally. The catheter was flushed with appropriate volume heparin dwells. The catheter exit site was secured with a 0-Prolene retention suture. The venotomy incision was closed with Dermabond. Dressings were applied. The patient tolerated the procedure well without immediate post procedural complication. IMPRESSION: Successful placement of 23 cm tip to cuff tunneled hemodialysis catheter via the left internal jugular vein with tips terminating within the superior aspect of the right atrium. The catheter is ready for immediate use. Electronically Signed   By: Jacqulynn Cadet M.D.   On: 03/13/2020 13:01   IR US Guide Vasc Access Left  Result Date: 03/13/2020 INDICATION: 45 year old female in need of hemodialysis. She presents for placement of a tunneled hemodialysis catheter. Evaluation of the right internal jugular vein demonstrates partial thrombosis. Therefore, we will proceed with a left-sided catheter placement. EXAM: TUNNELED CENTRAL VENOUS HEMODIALYSIS CATHETER PLACEMENT WITH ULTRASOUND AND FLUOROSCOPIC GUIDANCE MEDICATIONS: 5 mg metoprolol IV ANESTHESIA/SEDATION: Moderate (conscious) sedation was employed during this procedure. A total of Versed 1 mg and Fentanyl 100 mcg was administered intravenously. Moderate Sedation Time: 19 minutes. The patient's level of consciousness and vital signs were monitored continuously by radiology nursing throughout the procedure under my direct supervision. FLUOROSCOPY TIME:  Fluoroscopy Time: 0 minutes 30 seconds (2 mGy). COMPLICATIONS: None immediate. PROCEDURE: Informed written consent was obtained from the patient after a discussion of the risks, benefits, and alternatives to treatment. Questions regarding the procedure were encouraged and answered.  The left neck and chest were prepped with chlorhexidine in a sterile fashion, and a sterile drape was applied covering the operative field. Maximum barrier sterile technique with sterile gowns and gloves were used for the procedure. A timeout was performed prior to the initiation of the procedure. After creating a small venotomy incision, a micropuncture kit was utilized to access the left internal jugular vein under direct, real-time ultrasound guidance after the overlying soft tissues were anesthetized with 1% lidocaine with epinephrine. Ultrasound image documentation was performed. The microwire was kinked to measure appropriate catheter length. A stiff Glidewire was advanced to the level of the IVC and the micropuncture sheath was exchanged for a peel-away sheath. A palindrome tunneled hemodialysis catheter measuring 23 cm from tip to cuff was tunneled in a retrograde fashion from the anterior chest wall to the venotomy incision. The catheter was then placed through the peel-away sheath with tips ultimately positioned within the superior aspect of the right atrium. Final catheter positioning was confirmed and documented with a spot radiographic image. The catheter aspirates and flushes normally. The catheter was flushed with appropriate volume heparin dwells. The catheter exit  site was secured with a 0-Prolene retention suture. The venotomy incision was closed with Dermabond. Dressings were applied. The patient tolerated the procedure well without immediate post procedural complication. IMPRESSION: Successful placement of 23 cm tip to cuff tunneled hemodialysis catheter via the left internal jugular vein with tips terminating within the superior aspect of the right atrium. The catheter is ready for immediate use. Electronically Signed   By: Jacqulynn Cadet M.D.   On: 03/13/2020 13:01   VAS [place] UPPER EXT VEIN MAPPING (PRE-OP AVF)  Result Date: 03/13/2020 UPPER EXTREMITY VEIN MAPPING  Indications: Pre-access.  Comparison Study: previous 07/04/19 Performing Technologist: Abram Sander RVS  Examination Guidelines: A complete evaluation includes B-mode imaging, spectral Doppler, color Doppler, and power Doppler as needed of all accessible portions of each vessel. Bilateral testing is considered an integral part of a complete examination. Limited examinations for reoccurring indications may be performed as noted. +-----------------+-------------+----------+--------------+ Right Cephalic   Diameter (cm)Depth (cm)   Findings    +-----------------+-------------+----------+--------------+ Shoulder             0.36        1.65                  +-----------------+-------------+----------+--------------+ Prox upper arm       0.40        2.01                  +-----------------+-------------+----------+--------------+ Mid upper arm        0.47        1.27                  +-----------------+-------------+----------+--------------+ Dist upper arm       0.29        1.45                  +-----------------+-------------+----------+--------------+ Antecubital fossa    0.44        0.85     branching    +-----------------+-------------+----------+--------------+ Prox forearm                            not visualized +-----------------+-------------+----------+--------------+ Mid forearm                             not visualized +-----------------+-------------+----------+--------------+ Dist forearm                            not visualized +-----------------+-------------+----------+--------------+ Wrist                                   not visualized +-----------------+-------------+----------+--------------+ +-----------------+-------------+----------+--------------+ Right Basilic    Diameter (cm)Depth (cm)   Findings    +-----------------+-------------+----------+--------------+ Prox upper arm       0.33        1.86                   +-----------------+-------------+----------+--------------+ Mid upper arm        0.37        1.87                  +-----------------+-------------+----------+--------------+ Dist upper arm       0.33        1.84                  +-----------------+-------------+----------+--------------+  Antecubital fossa    0.43        1.48                  +-----------------+-------------+----------+--------------+ Prox forearm         0.30        0.41                  +-----------------+-------------+----------+--------------+ Mid forearm          0.27        0.35                  +-----------------+-------------+----------+--------------+ Distal forearm                          not visualized +-----------------+-------------+----------+--------------+ Elbow                                   not visualized +-----------------+-------------+----------+--------------+ Wrist                                   not visualized +-----------------+-------------+----------+--------------+ *See table(s) above for measurements and observations.  Diagnosing physician: Harold Barban MD Electronically signed by Harold Barban MD on 03/13/2020 at 10:07:55 PM.    Final       Procedures: HD catheter AV fistula creation   Subjective: Patient reports nausea but not vomiting, no chest pain or dyspnea, has been tolerating po.   Discharge Exam: Vitals:   03/18/20 0900 03/18/20 1154  BP: (!) 163/88 (!) 200/95  Pulse: 86 82  Resp: (!) 22 18  Temp:  98.6 F (37 C)  SpO2: 100% 100%   Vitals:   03/17/20 2023 03/18/20 0426 03/18/20 0900 03/18/20 1154  BP: (!) 157/93 (!) 173/92 (!) 163/88 (!) 200/95  Pulse: 86 83 86 82  Resp: 19 (!) 24 (!) 22 18  Temp: 98.2 F (36.8 C) 98.6 F (37 C)  98.6 F (37 C)  TempSrc: Oral Oral  Oral  SpO2: 100% 98% 100% 100%  Weight:  95.9 kg      General: Not in pain or dyspnea, Neurology: Awake and alert, twitching movements of her face, involuntary.   E ENT:  mild pallor, no icterus, oral mucosa moist Cardiovascular: No JVD. S1-S2 present, rhythmic, no gallops, rubs, or murmurs. No lower extremity edema. Pulmonary: vesicular breath sounds bilaterally, adequate air movement, no wheezing, rhonchi or rales. Gastrointestinal. Abdomen soft and non tender Skin. No rashes Musculoskeletal: no joint deformities   The results of significant diagnostics from this hospitalization (including imaging, microbiology, ancillary and laboratory) are listed below for reference.     Microbiology: Recent Results (from the past 240 hour(s))  MRSA PCR Screening     Status: None   Collection Time: 03/12/20  2:32 PM   Specimen: Nasal Mucosa; Nasopharyngeal  Result Value Ref Range Status   MRSA by PCR NEGATIVE NEGATIVE Final    Comment:        The GeneXpert MRSA Assay (FDA approved for NASAL specimens only), is one component of a comprehensive MRSA colonization surveillance program. It is not intended to diagnose MRSA infection nor to guide or monitor treatment for MRSA infections. Performed at [facility], [place] [address], [place], [place] [address]   SARS CORONAVIRUS 2 (TAT 6-24 HRS) Nasopharyngeal Nasal Mucosa     Status: None  Collection Time: 03/12/20  2:32 PM   Specimen: Nasal Mucosa; Nasopharyngeal  Result Value Ref Range Status   SARS Coronavirus 2 NEGATIVE NEGATIVE Final    Comment: (NOTE) SARS-CoV-2 target nucleic acids are NOT DETECTED.  The SARS-CoV-2 RNA is generally detectable in upper and lower respiratory specimens during the acute phase of infection. Negative results do not preclude SARS-CoV-2 infection, do not rule out co-infections with other pathogens, and should not be used as the sole basis for treatment or other patient management decisions. Negative results must be combined with clinical observations, patient history, and epidemiological information. The expected result is Negative.  Fact Sheet for  Patients: SugarRoll.be  Fact Sheet for Healthcare Providers: https://www.woods-mathews.com/  This test is not yet approved or cleared by the [place] FDA and  has been authorized for detection and/or diagnosis of SARS-CoV-2 by FDA under an Emergency Use Authorization (EUA). This EUA will remain  in effect (meaning this test can be used) for the duration of the COVID-19 declaration under Se ction 564(b)(1) of the Act, 21 U.S.C. section 360bbb-3(b)(1), unless the authorization is terminated or revoked sooner.  Performed at [facility], [place] [address] [place], [place] [address]      Labs: BNP (last 3 results) No results for input(s): BNP in the last 8760 hours. Basic Metabolic Panel: Recent Labs  Lab 03/13/20 0357 03/14/20 0657 03/15/20 0257 03/15/20 1025 03/16/20 0415 03/17/20 0532 03/18/20 0353  NA 134* 135 137 138 135 136 135  K 3.2* 3.7 3.8 3.5 3.6 3.7 3.6  CL 92* 98 98 100 98 99 96*  CO2 '25 28 29  ' --  '26 26 25  ' GLUCOSE 137* 95 93 93 93 104* 106*  BUN 37* 19 12 5* 11 22* 30*  CREATININE 12.28* 8.19* 5.38* 3.00* 5.39* 7.88* 10.37*  CALCIUM 9.5 9.3 10.1  --  10.2 10.3 10.2  PHOS 7.0* 4.8* 3.7  --  3.9 4.1  --    Liver Function Tests: Recent Labs  Lab 03/13/20 0357 03/14/20 0657 03/15/20 0257 03/16/20 0415 03/17/20 0532  ALBUMIN 2.9* 2.6* 2.9* 2.9* 2.8*   No results for input(s): LIPASE, AMYLASE in the last 168 hours. No results for input(s): AMMONIA in the last 168 hours. CBC: Recent Labs  Lab 03/11/20 1430 03/12/20 0446 03/13/20 0357 03/14/20 0657 03/15/20 0257 03/15/20 1025  WBC 7.6 7.2 8.0 7.0 8.9  --   HGB 8.9* 7.6* 7.5* 7.8* 8.8* 10.9*  HCT 28.5* 23.8* 23.5* 24.9* 28.6* 32.0*  MCV 89.9 88.5 87.0 88.3 89.1  --   PLT 303 256 308 263 291  --    Cardiac Enzymes: No results for input(s): CKTOTAL, CKMB, CKMBINDEX, TROPONINI in the last 168 hours. BNP: Invalid input(s): POCBNP CBG: No results  for input(s): GLUCAP in the last 168 hours. D-Dimer No results for input(s): DDIMER in the last 72 hours. Hgb A1c No results for input(s): HGBA1C in the last 72 hours. Lipid Profile No results for input(s): CHOL, HDL, LDLCALC, TRIG, CHOLHDL, LDLDIRECT in the last 72 hours. Thyroid function studies No results for input(s): TSH, T4TOTAL, T3FREE, THYROIDAB in the last 72 hours.  Invalid input(s): FREET3 Anemia work up No results for input(s): VITAMINB12, FOLATE, FERRITIN, TIBC, IRON, RETICCTPCT in the last 72 hours. Urinalysis    Component Value Date/Time   COLORURINE COLORLESS (A) 04/03/2018 2319   APPEARANCEUR CLEAR 04/03/2018 2319   LABSPEC 1.006 04/03/2018 2319   PHURINE 8.0 04/03/2018 2319   GLUCOSEU >=500 (A) 04/03/2018 2319  HGBUR NEGATIVE 04/03/2018 [place] 04/03/2018 2319   KETONESUR NEGATIVE 04/03/2018 2319   PROTEINUR 100 (A) 04/03/2018 2319   UROBILINOGEN 0.2 09/19/2014 1620   NITRITE NEGATIVE 04/03/2018 2319   LEUKOCYTESUR NEGATIVE 04/03/2018 2319   Sepsis Labs Invalid input(s): PROCALCITONIN,  WBC,  LACTICIDVEN Microbiology Recent Results (from the past 240 hour(s))  MRSA PCR Screening     Status: None   Collection Time: 03/12/20  2:32 PM   Specimen: Nasal Mucosa; Nasopharyngeal  Result Value Ref Range Status   MRSA by PCR NEGATIVE NEGATIVE Final    Comment:        The GeneXpert MRSA Assay (FDA approved for NASAL specimens only), is one component of a comprehensive MRSA colonization surveillance program. It is not intended to diagnose MRSA infection nor to guide or monitor treatment for MRSA infections. Performed at [facility], [place] [address] [place], [place] [address]   SARS CORONAVIRUS 2 (TAT 6-24 HRS) Nasopharyngeal Nasal Mucosa     Status: None   Collection Time: 03/12/20  2:32 PM   Specimen: Nasal Mucosa; Nasopharyngeal  Result Value Ref Range Status   SARS Coronavirus 2 NEGATIVE NEGATIVE Final    Comment:  (NOTE) SARS-CoV-2 target nucleic acids are NOT DETECTED.  The SARS-CoV-2 RNA is generally detectable in upper and lower respiratory specimens during the acute phase of infection. Negative results do not preclude SARS-CoV-2 infection, do not rule out co-infections with other pathogens, and should not be used as the sole basis for treatment or other patient management decisions. Negative results must be combined with clinical observations, patient history, and epidemiological information. The expected result is Negative.  Fact Sheet for Patients: SugarRoll.be  Fact Sheet for Healthcare Providers: https://www.woods-mathews.com/  This test is not yet approved or cleared by the [place] FDA and  has been authorized for detection and/or diagnosis of SARS-CoV-2 by FDA under an Emergency Use Authorization (EUA). This EUA will remain  in effect (meaning this test can be used) for the duration of the COVID-19 declaration under Se ction 564(b)(1) of the Act, 21 U.S.C. section 360bbb-3(b)(1), unless the authorization is terminated or revoked sooner.  Performed at [facility], [place] [address], [place], [place] [address]      Time coordinating discharge: 45 minutes  SIGNED:   Tawni Millers, MD  [facility] 03/18/2020, 1:29 PM

## 2020-03-18 NOTE — Progress Notes (Signed)
Renal Navigator has [facility] to follow up on in-center seat schedule and will continue to follow.  Alphonzo Cruise, [place] Renal Navigator [phone]

## 2020-03-18 NOTE — Progress Notes (Signed)
PT Cancellation Note  Patient Details Name: Patricia [name] MRN: [id number] DOB: [birth year]   Cancelled Treatment:    Reason Eval/Treat Not Completed: Patient at procedure or test/unavailable.  Pt has been in HD most of my workday.  May not get back to see her this day and see her as able 3/15. 03/18/2020  Ginger Carne., PT Acute Rehabilitation Services [phone]  (pager) [phone]  (office)   Tessie Fass Aurther Harlin 03/18/2020, 4:07 PM

## 2020-03-18 NOTE — Progress Notes (Signed)
[facility] ROUNDING NOTE   Subjective:   Brief history: 45 year old lady with end-stage renal disease on peritoneal dialysis, hypertension chronic anemia was admitted with hypertensive urgency in setting of hyperkalemia and uremia secondary to end-stage renal disease.  She had severe myoclonus due to under dialysis and peritoneal dialysis.  She was transitioned over to hemodialysis.  Her last dialysis treatment is 03/16/2019.  There was the removal of 1.3 L.  We are waiting placement and CLIP process for outpatient dialysis.  She underwent first stage right upper arm AV fistula 03/16/2019.  PD catheter still in place will need this removed as an outpatient.  Blood pressure 173/92 pulse 82 temperature 98.6 O2 sats 97%  Sodium 135 potassium 3.6 chloride 96 CO2 25 BUN 30 creatinine 10 glucose 107, creatinine 10.37 calcium 10.2 hemoglobin 10.9 iron saturations 21%  Allopurinol 100 mg daily amlodipine 10 mg daily atorvastatin 40 mg daily, hydralazine 100 mg every 8 hours isosorbide 10 mg 3 times daily Fosrenol 1.5 g with meals, lisinopril 20 mg daily metoprolol 100 mg twice daily       Objective:  Vital signs in last 24 hours:  Temp:  [98.2 F (36.8 C)-98.8 F (37.1 C)] 98.6 F (37 C) (03/14 0426) Pulse Rate:  [75-86] 83 (03/14 0426) Resp:  [19-24] 24 (03/14 0426) BP: (157-185)/(83-96) 173/92 (03/14 0426) SpO2:  [98 %-100 %] 98 % (03/14 0426) Weight:  [95.9 kg] 95.9 kg (03/14 0426)  Weight change: -1.4 kg Filed Weights   03/16/20 0442 03/17/20 0037 03/18/20 0426  Weight: 97.6 kg 97.3 kg 95.9 kg    Intake/Output: I/O last 3 completed shifts: In: 120 [P.O.:120] Out: -    Intake/Output this shift:  No intake/output data recorded.  General::Obese femalein no acute distress Heart:S1,S2 RRR. SR on monitor. Lungs:CTAB Abdomen:Obese, active BS. PD catheter intact. Extremities:No LE edema Dialysis Access:PD catheter, drsg CDI.LIJ TDC Drsg CDI.  Neuro:  minimal eyelid fluttering, no asterixis   Basic Metabolic Panel: Recent Labs  Lab 03/13/20 0357 03/14/20 0657 03/15/20 0257 03/15/20 1025 03/16/20 0415 03/17/20 0532 03/18/20 0353  NA 134* 135 137 138 135 136 135  K 3.2* 3.7 3.8 3.5 3.6 3.7 3.6  CL 92* 98 98 100 98 99 96*  CO2 $Re'25 28 29  'XdK$ --  $R'26 26 25  'OK$ GLUCOSE 137* 95 93 93 93 104* 106*  BUN 37* 19 12 5* 11 22* 30*  CREATININE 12.28* 8.19* 5.38* 3.00* 5.39* 7.88* 10.37*  CALCIUM 9.5 9.3 10.1  --  10.2 10.3 10.2  PHOS 7.0* 4.8* 3.7  --  3.9 4.1  --     Liver Function Tests: Recent Labs  Lab 03/13/20 0357 03/14/20 0657 03/15/20 0257 03/16/20 0415 03/17/20 0532  ALBUMIN 2.9* 2.6* 2.9* 2.9* 2.8*   No results for input(s): LIPASE, AMYLASE in the last 168 hours. No results for input(s): AMMONIA in the last 168 hours.  CBC: Recent Labs  Lab 03/11/20 1430 03/12/20 0446 03/13/20 0357 03/14/20 0657 03/15/20 0257 03/15/20 1025  WBC 7.6 7.2 8.0 7.0 8.9  --   HGB 8.9* 7.6* 7.5* 7.8* 8.8* 10.9*  HCT 28.5* 23.8* 23.5* 24.9* 28.6* 32.0*  MCV 89.9 88.5 87.0 88.3 89.1  --   PLT 303 256 308 263 291  --     Cardiac Enzymes: No results for input(s): CKTOTAL, CKMB, CKMBINDEX, TROPONINI in the last 168 hours.  BNP: Invalid input(s): POCBNP  CBG: No results for input(s): GLUCAP in the last 168 hours.  Microbiology: Results for orders placed  or performed during the hospital encounter of 03/11/20  MRSA PCR Screening     Status: None   Collection Time: 03/12/20  2:32 PM   Specimen: Nasal Mucosa; Nasopharyngeal  Result Value Ref Range Status   MRSA by PCR NEGATIVE NEGATIVE Final    Comment:        The GeneXpert MRSA Assay (FDA approved for NASAL specimens only), is one component of a comprehensive MRSA colonization surveillance program. It is not intended to diagnose MRSA infection nor to guide or monitor treatment for MRSA infections. Performed at [facility], [place] [address], [place], [place] [address]    SARS CORONAVIRUS 2 (TAT 6-24 HRS) Nasopharyngeal Nasal Mucosa     Status: None   Collection Time: 03/12/20  2:32 PM   Specimen: Nasal Mucosa; Nasopharyngeal  Result Value Ref Range Status   SARS Coronavirus 2 NEGATIVE NEGATIVE Final    Comment: (NOTE) SARS-CoV-2 target nucleic acids are NOT DETECTED.  The SARS-CoV-2 RNA is generally detectable in upper and lower respiratory specimens during the acute phase of infection. Negative results do not preclude SARS-CoV-2 infection, do not rule out co-infections with other pathogens, and should not be used as the sole basis for treatment or other patient management decisions. Negative results must be combined with clinical observations, patient history, and epidemiological information. The expected result is Negative.  Fact Sheet for Patients: SugarRoll.be  Fact Sheet for Healthcare Providers: https://www.woods-mathews.com/  This test is not yet approved or cleared by the [place] FDA and  has been authorized for detection and/or diagnosis of SARS-CoV-2 by FDA under an Emergency Use Authorization (EUA). This EUA will remain  in effect (meaning this test can be used) for the duration of the COVID-19 declaration under Se ction 564(b)(1) of the Act, 21 U.S.C. section 360bbb-3(b)(1), unless the authorization is terminated or revoked sooner.  Performed at [facility], [place] [address], [place], [place] [address]     Coagulation Studies: No results for input(s): LABPROT, INR in the last 72 hours.  Urinalysis: No results for input(s): COLORURINE, LABSPEC, PHURINE, GLUCOSEU, HGBUR, BILIRUBINUR, KETONESUR, PROTEINUR, UROBILINOGEN, NITRITE, LEUKOCYTESUR in the last 72 hours.  Invalid input(s): APPERANCEUR    Imaging: No results found.   Medications:   . sodium chloride    . ferric gluconate (FERRLECIT/NULECIT) IV     . (feeding supplement) PROSource Plus  30 mL Oral BID BM  .  allopurinol  100 mg Oral QHS  . amLODipine  10 mg Oral Daily  . atorvastatin  40 mg Oral q1800  . Chlorhexidine Gluconate Cloth  6 each Topical Daily  . feeding supplement (NEPRO CARB STEADY)  237 mL Oral TID BM  . gentamicin ointment  1 application Topical Daily  . heparin  5,000 Units Subcutaneous Q8H  . hydrALAZINE  100 mg Oral Q8H  . isosorbide dinitrate  10 mg Oral TID  . lanthanum  1,500 mg Oral TID with meals  . lisinopril  20 mg Oral QHS  . metoprolol tartrate  100 mg Oral BID  . multivitamin  1 tablet Oral QHS  . mupirocin ointment  1 application Nasal BID  . ondansetron  4 mg Oral TID AC  . pantoprazole  40 mg Oral BID  . sucralfate  1 g Oral TID WC & HS   sodium chloride, acetaminophen **OR** acetaminophen, ALPRAZolam, fentaNYL (SUBLIMAZE) injection, hydrOXYzine, labetalol, ondansetron **OR** ondansetron (ZOFRAN) IV, oxyCODONE-acetaminophen, promethazine, senna-docusate, zolpidem  Assessment/ Plan:  1. Myoclonus: severe, due to underdialysis on  PD. Resolved w/ transition to HD  2. Hypertensive Crisis/ hx severe HTN/ volume: on multiple meds including norvasc, hydralazine, metoprolol and lisinopril. Is not tolerating further UF, down 7kg from admit and no vol^ on exam. Will set new dry wt around 97.5kg. HTN not strictly vol dependent   3. ESRD on PD: failed PD and needed HD for clearance of uremia. TDC placed by IR. Gen surg declined request to remove PD cath here,  this can be done in OP setting.  CLIP pt to OP HD pend.  Next dialysis 03/18/2020.  Plan for discharge once arrangements have been made 4. Perm access: SP R 1st stg RUA BB AVF on 3/11. Per VVS.  5. Chest pain: resolved.  6. Anemia -  Last ESA Mircera 225 mcg 03/11/2020.Fe 47 Tsat 21. Getting IV Fe w/ HD here.Follow HGB. 7. Metabolic bone disease - Calcium 10.4. Not on VDRA. Continue binders.PO4 elevated.  Increased fosrenol to 1.5g PO TID AC.  8. History of noncompliance    LOS: 7 Sherril Croon $RemoveBefor'@TODAY'eLXlYkzJjEct$ $Rem'@6'KXrS$ :36 AM

## 2020-03-18 NOTE — Plan of Care (Signed)
  Problem: Clinical Measurements: Goal: Will remain free from infection Outcome: Progressing   Problem: Activity: Goal: Risk for activity intolerance will decrease Outcome: Progressing   Problem: Coping: Goal: Level of anxiety will decrease Outcome: Progressing   Problem: Safety: Goal: Ability to remain free from injury will improve Outcome: Progressing   

## 2020-03-19 MED ORDER — ZOLPIDEM TARTRATE 5 MG PO TABS: 5.0000 mg | ORAL_TABLET | Freq: Every evening | ORAL | 0 refills | Status: DC | PRN

## 2020-03-19 NOTE — Plan of Care (Signed)
  Problem: Acute Rehab PT Goals(only PT should resolve) Goal: Pt Will Go Supine/Side To Sit Outcome: Adequate for Discharge Goal: Pt Will Go Sit To Supine/Side Outcome: Adequate for Discharge Goal: Patient Will Transfer Sit To/From Stand Outcome: Adequate for Discharge Goal: Pt Will Ambulate Outcome: Adequate for Discharge Goal: Pt Will Go Up/Down Stairs Outcome: Adequate for Discharge   Problem: Education: Goal: Knowledge of General Education information will improve Description: Including pain rating scale, medication(s)/side effects and non-pharmacologic comfort measures Outcome: Adequate for Discharge   Problem: Health Behavior/Discharge Planning: Goal: Ability to manage health-related needs will improve Outcome: Adequate for Discharge   Problem: Clinical Measurements: Goal: Ability to maintain clinical measurements within normal limits will improve Outcome: Adequate for Discharge Goal: Will remain free from infection Outcome: Adequate for Discharge Goal: Diagnostic test results will improve Outcome: Adequate for Discharge Goal: Respiratory complications will improve Outcome: Adequate for Discharge Goal: Cardiovascular complication will be avoided Outcome: Adequate for Discharge   Problem: Activity: Goal: Risk for activity intolerance will decrease Outcome: Adequate for Discharge   Problem: Nutrition: Goal: Adequate nutrition will be maintained Outcome: Adequate for Discharge   Problem: Coping: Goal: Level of anxiety will decrease Outcome: Adequate for Discharge   Problem: Elimination: Goal: Will not experience complications related to bowel motility Outcome: Adequate for Discharge Goal: Will not experience complications related to urinary retention Outcome: Adequate for Discharge   Problem: Pain Managment: Goal: General experience of comfort will improve Outcome: Adequate for Discharge   Problem: Safety: Goal: Ability to remain free from injury will  improve Outcome: Adequate for Discharge   Problem: Skin Integrity: Goal: Risk for impaired skin integrity will decrease Outcome: Adequate for Discharge   Problem: Malnutrition  (NI-5.2) Goal: Food and/or nutrient delivery Description: Individualized approach for food/nutrient provision. Outcome: Adequate for Discharge   Problem: Acute Rehab OT Goals (only OT should resolve) Goal: Pt. Will Perform Grooming Outcome: Adequate for Discharge Goal: Pt. Will Perform Lower Body Bathing Outcome: Adequate for Discharge Goal: Pt. Will Perform Lower Body Dressing Outcome: Adequate for Discharge Goal: Pt. Will Transfer To Toilet Outcome: Adequate for Discharge Goal: Pt. Will Perform Toileting-Clothing Manipulation Outcome: Adequate for Discharge

## 2020-03-19 NOTE — TOC Transition Note (Signed)
Transition of Care [facility]) - CM/SW Discharge Note   Patient Details  Name: Keyana Guevara MRN: [id number] Date of Birth: 1975-11-06  Transition of Care [facility]) CM/SW Contact:  Zenon Mayo, RN Phone Number: 03/19/2020, 10:58 AM   Clinical Narrative:    Patient is for dc today, NCM notified Tommi Rumps with [employer] and [employer] for walker.    Final next level of care: [place] Barriers to Discharge: No Barriers Identified   Patient Goals and CMS Choice Patient states their goals for this hospitalization and ongoing recovery are:: build muscle, get back to normal CMS Medicare.gov Compare Post Acute Care list provided to:: Patient Choice offered to / list presented to : Patient  Discharge Placement                       Discharge Plan and Services In-house Referral: NA Discharge Planning Services: CM Consult Post Acute Care Choice: Durable Medical Equipment          DME Arranged: Gilford Rile rolling DME Agency: [employer] Date DME Agency Contacted: 03/15/20 Time DME Agency Contacted: [id number] Representative spoke with at DME Agency: [place]: PT,OT [place]: [place] Date [place]: 03/15/20 Time [place]: 1603 Representative spoke with at [place]: [place] ([place]) Interventions     Readmission Risk Interventions Readmission Risk Prevention Plan 03/15/2020 04/28/2019  Transportation Screening Complete Complete  PCP or Specialist Appt within 3-5 Days - Complete  HRI or [place] - Complete  Social Work Consult for [place] Planning/Counseling - Complete  Palliative Care Screening - Not Applicable  Medication Review Press photographer) Complete Complete  HRI or Home Care Consult Complete -  SW Recovery Care/Counseling Consult Complete -  Palliative Care Screening Not Applicable -  [place] Not Applicable -  Some recent data might be hidden

## 2020-03-19 NOTE — Progress Notes (Signed)
[facility] ROUNDING NOTE   Subjective:   Brief history: 45 year old lady with end-stage renal disease on peritoneal dialysis, hypertension chronic anemia was admitted with hypertensive urgency in setting of hyperkalemia and uremia secondary to end-stage renal disease.  She had severe myoclonus due to under dialysis and peritoneal dialysis.  She was transitioned over to hemodialysis.  Her last dialysis treatment is 03/18/2020 there was the ultrafiltration of 1 L.   Appreciate assistance of nephrology coordinator, it looks like she has placement on a Tuesday Thursday Saturday dialysis schedule at [facility].  She underwent first stage right upper arm AV fistula 03/16/2019.  PD catheter still in place will need this removed as an outpatient.  Blood pressure 139/73 pulse 77 temperature 98.9  Sodium 135 potassium 3.6 chloride 96 CO2 25 BUN 30 creatinine 10.37 glucose 106 calcium 10.2 hemoglobin 10.9  Allopurinol 100 mg daily amlodipine 10 mg daily atorvastatin 40 mg daily, hydralazine 100 mg every 8 hours isosorbide 10 mg 3 times daily Fosrenol 1.5 g with meals, lisinopril 20 mg daily metoprolol 100 mg twice daily       Objective:  Vital signs in last 24 hours:  Temp:  [98.1 F (36.7 C)-99.4 F (37.4 C)] 98.9 F (37.2 C) (03/15 0537) Pulse Rate:  [82-106] 83 (03/15 0537) Resp:  [14-25] 15 (03/15 0537) BP: (139-200)/(73-106) 139/73 (03/15 0537) SpO2:  [100 %] 100 % (03/15 0537) Weight:  [93.8 kg-95.9 kg] 94.9 kg (03/15 0400)  Weight change: 0 kg Filed Weights   03/18/20 1345 03/18/20 1730 03/19/20 0400  Weight: 95.9 kg 93.8 kg 94.9 kg    Intake/Output: I/O last 3 completed shifts: In: 590 [P.O.:480; IV Piggyback:110] Out: 1000 [Other:1000]   Intake/Output this shift:  No intake/output data recorded.  General::Obese femalein no acute distress Heart:S1,S2 RRR. SR on monitor. Lungs:CTAB Abdomen:Obese, active BS. PD catheter intact. Extremities:No LE  edema Dialysis Access:PD catheter, drsg CDI.LIJ TDC Drsg CDI.  Neuro: minimal eyelid fluttering, no asterixis   Basic Metabolic Panel: Recent Labs  Lab 03/13/20 0357 03/14/20 0657 03/15/20 0257 03/15/20 1025 03/16/20 0415 03/17/20 0532 03/18/20 0353  NA 134* 135 137 138 135 136 135  K 3.2* 3.7 3.8 3.5 3.6 3.7 3.6  CL 92* 98 98 100 98 99 96*  CO2 $Re'25 28 29  'JPn$ --  $R'26 26 25  'iY$ GLUCOSE 137* 95 93 93 93 104* 106*  BUN 37* 19 12 5* 11 22* 30*  CREATININE 12.28* 8.19* 5.38* 3.00* 5.39* 7.88* 10.37*  CALCIUM 9.5 9.3 10.1  --  10.2 10.3 10.2  PHOS 7.0* 4.8* 3.7  --  3.9 4.1  --     Liver Function Tests: Recent Labs  Lab 03/13/20 0357 03/14/20 0657 03/15/20 0257 03/16/20 0415 03/17/20 0532  ALBUMIN 2.9* 2.6* 2.9* 2.9* 2.8*   No results for input(s): LIPASE, AMYLASE in the last 168 hours. No results for input(s): AMMONIA in the last 168 hours.  CBC: Recent Labs  Lab 03/13/20 0357 03/14/20 0657 03/15/20 0257 03/15/20 1025  WBC 8.0 7.0 8.9  --   HGB 7.5* 7.8* 8.8* 10.9*  HCT 23.5* 24.9* 28.6* 32.0*  MCV 87.0 88.3 89.1  --   PLT 308 263 291  --     Cardiac Enzymes: No results for input(s): CKTOTAL, CKMB, CKMBINDEX, TROPONINI in the last 168 hours.  BNP: Invalid input(s): POCBNP  CBG: No results for input(s): GLUCAP in the last 168 hours.  Microbiology: Results for orders placed or performed during the hospital encounter of 03/11/20  MRSA  PCR Screening     Status: None   Collection Time: 03/12/20  2:32 PM   Specimen: Nasal Mucosa; Nasopharyngeal  Result Value Ref Range Status   MRSA by PCR NEGATIVE NEGATIVE Final    Comment:        The GeneXpert MRSA Assay (FDA approved for NASAL specimens only), is one component of a comprehensive MRSA colonization surveillance program. It is not intended to diagnose MRSA infection nor to guide or monitor treatment for MRSA infections. Performed at [facility], [place] [address], [place], [place] [address]   SARS  CORONAVIRUS 2 (TAT 6-24 HRS) Nasopharyngeal Nasal Mucosa     Status: None   Collection Time: 03/12/20  2:32 PM   Specimen: Nasal Mucosa; Nasopharyngeal  Result Value Ref Range Status   SARS Coronavirus 2 NEGATIVE NEGATIVE Final    Comment: (NOTE) SARS-CoV-2 target nucleic acids are NOT DETECTED.  The SARS-CoV-2 RNA is generally detectable in upper and lower respiratory specimens during the acute phase of infection. Negative results do not preclude SARS-CoV-2 infection, do not rule out co-infections with other pathogens, and should not be used as the sole basis for treatment or other patient management decisions. Negative results must be combined with clinical observations, patient history, and epidemiological information. The expected result is Negative.  Fact Sheet for Patients: SugarRoll.be  Fact Sheet for Healthcare Providers: https://www.woods-mathews.com/  This test is not yet approved or cleared by the [place] FDA and  has been authorized for detection and/or diagnosis of SARS-CoV-2 by FDA under an Emergency Use Authorization (EUA). This EUA will remain  in effect (meaning this test can be used) for the duration of the COVID-19 declaration under Se ction 564(b)(1) of the Act, 21 U.S.C. section 360bbb-3(b)(1), unless the authorization is terminated or revoked sooner.  Performed at [facility], [place] [address] [place], [place] [address]     Coagulation Studies: No results for input(s): LABPROT, INR in the last 72 hours.  Urinalysis: No results for input(s): COLORURINE, LABSPEC, PHURINE, GLUCOSEU, HGBUR, BILIRUBINUR, KETONESUR, PROTEINUR, UROBILINOGEN, NITRITE, LEUKOCYTESUR in the last 72 hours.  Invalid input(s): APPERANCEUR    Imaging: No results found.   Medications:   . sodium chloride    . ferric gluconate (FERRLECIT/NULECIT) IV 125 mg (03/18/20 1630)   . (feeding supplement) PROSource Plus  30 mL Oral  BID BM  . allopurinol  100 mg Oral QHS  . amLODipine  10 mg Oral Daily  . atorvastatin  40 mg Oral q1800  . Chlorhexidine Gluconate Cloth  6 each Topical Daily  . feeding supplement (NEPRO CARB STEADY)  237 mL Oral TID BM  . gentamicin ointment  1 application Topical Daily  . heparin  5,000 Units Subcutaneous Q8H  . hydrALAZINE  100 mg Oral Q8H  . isosorbide dinitrate  10 mg Oral TID  . lanthanum  1,500 mg Oral TID with meals  . lisinopril  20 mg Oral QHS  . metoprolol tartrate  100 mg Oral BID  . multivitamin  1 tablet Oral QHS  . ondansetron  4 mg Oral TID AC  . pantoprazole  40 mg Oral BID  . sucralfate  1 g Oral TID WC & HS   sodium chloride, acetaminophen **OR** acetaminophen, ALPRAZolam, fentaNYL (SUBLIMAZE) injection, hydrOXYzine, labetalol, ondansetron **OR** ondansetron (ZOFRAN) IV, oxyCODONE-acetaminophen, promethazine, senna-docusate, zolpidem  Assessment/ Plan:  1. 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 Resolved w/ transition to HD  2. Hypertensive Crisis/ hx severe HTN/ volume: on multiple meds  including norvasc, hydralazine, metoprolol and lisinopril.  Appears to be stable on current regimen 3. ESRD on PD: failed PD and needed HD for clearance of uremia. TDC placed by IR. Gen surg declined request to remove PD cath here,  this can be done in OP setting.  CLIP pt to OP HD pend.  Next dialysis 03/21/2020.  Plan for discharge once arrangements have been made.  Outpatient dialysis [facility] Tuesday Thursday Saturday schedule 4. Perm access: SP R 1st stg RUA BB AVF on 3/11. Per VVS.  5. Chest pain: resolved.  6. Anemia -  Last ESA Mircera 225 mcg 03/11/2020.Fe 47 Tsat 21. Getting IV Fe w/ HD here.Follow HGB. 7. Metabolic bone disease - Calcium controlled. Not on VDRA. Continue binders.PO4 elevated.  Increased fosrenol to 1.5g PO TID AC.  8. History of noncompliance    LOS: 8 Patricia [name] $RemoveBefor'@TODAY'[email or web address]$ $Rem'@7'AOOa$ :06 AM

## 2020-03-19 NOTE — Progress Notes (Signed)
[facility] has been secured starting this Thursday, 3/17, however, when I called patient to inform, her RN got on the phone and stated she had spoken with patient's daughter who states they are able to take patient to HD. Navigator received permission to call her daughter (yesterday she asked that she speak with her daughter first, which Navigator respected).  Navigator called [phone] who states she and her sister, as well as patient's friends will be able to work out transportation to/from HD at patient's new seat time. She was grateful for Navigator's assistance in arranging transportation and asked if this can be kept as a back up PRN. Navigator gave [place] the phone number-[phone] for PRN purposes. Navigator informed Thayer Headings that patient needs to go to outpatient HD on Thursday for next tx since she had HD yesterday. She was very Patent attorney. Navigator updated Cabin crew.  Navigator informed Attending and Nephrologist of patient's situation being resolved and no further barriers for discharge today.  Alphonzo Cruise, [place] [facility] Navigator [phone]

## 2020-03-19 NOTE — Progress Notes (Signed)
D/C instructions given and reviewed. Tele and IV removed, tolerated well. Awaiting daughter to transport home.

## 2020-03-19 NOTE — Care Management Important Message (Signed)
Important Message  Patient Details  Name: Patricia [name] MRN: [id number] Date of Birth: Aug 13, 1975   Medicare Important Message Given:  Yes     Shelda Altes 03/19/2020, 8:28 AM

## 2020-03-19 NOTE — Progress Notes (Signed)
Patricia [name] is feeling better.  Her discharge was held yesterday due to logistics for outpatient hemodialysis.  Her vital signs include a blood pressure 155/77, heart rate 87.  Her physical examination is unchanged.  Plan to discharge patient home today.

## 2020-03-20 ENCOUNTER — Telehealth: Payer: Self-pay | Admitting: Nurse Practitioner

## 2020-03-20 NOTE — Telephone Encounter (Signed)
Transition of care contact from inpatient facility  Date of Discharge: 03/19/2020 Date of Contact: 03/20/2020 Method of contact: Phone  Attempted to contact patient to discuss transition of care from inpatient admission. Patient did not answer the phone. Message was left on the patient's voicemail with call back number [phone].

## 2020-04-05 DIAGNOSIS — G609 Hereditary and idiopathic neuropathy, unspecified: Secondary | ICD-10-CM | POA: Insufficient documentation

## 2020-04-05 DIAGNOSIS — F411 Generalized anxiety disorder: Secondary | ICD-10-CM | POA: Insufficient documentation

## 2020-04-05 DIAGNOSIS — G629 Polyneuropathy, unspecified: Secondary | ICD-10-CM | POA: Insufficient documentation

## 2020-04-05 DIAGNOSIS — F5101 Primary insomnia: Secondary | ICD-10-CM | POA: Insufficient documentation

## 2020-04-05 DIAGNOSIS — H6123 Impacted cerumen, bilateral: Secondary | ICD-10-CM | POA: Insufficient documentation

## 2020-04-05 DIAGNOSIS — Z8679 Personal history of other diseases of the circulatory system: Secondary | ICD-10-CM | POA: Insufficient documentation

## 2020-04-05 DIAGNOSIS — M1A00X0 Idiopathic chronic gout, unspecified site, without tophus (tophi): Secondary | ICD-10-CM | POA: Insufficient documentation

## 2020-04-10 ENCOUNTER — Other Ambulatory Visit: Payer: Self-pay

## 2020-04-10 DIAGNOSIS — N186 End stage renal disease: Secondary | ICD-10-CM

## 2020-04-10 DIAGNOSIS — Z992 Dependence on renal dialysis: Secondary | ICD-10-CM

## 2020-04-13 ENCOUNTER — Emergency Department (HOSPITAL_COMMUNITY)
Admission: EM | Admit: 2020-04-13 | Discharge: 2020-04-13 | Disposition: A | Discharge status: Home / Self Care | Payer: Medicare Other | Attending: Emergency Medicine | Admitting: Emergency Medicine

## 2020-04-13 DIAGNOSIS — Z992 Dependence on renal dialysis: Secondary | ICD-10-CM | POA: Insufficient documentation

## 2020-04-13 DIAGNOSIS — N186 End stage renal disease: Secondary | ICD-10-CM | POA: Diagnosis not present

## 2020-04-13 DIAGNOSIS — I10 Essential (primary) hypertension: Secondary | ICD-10-CM

## 2020-04-13 DIAGNOSIS — Z79899 Other long term (current) drug therapy: Secondary | ICD-10-CM | POA: Insufficient documentation

## 2020-04-13 DIAGNOSIS — I120 Hypertensive chronic kidney disease with stage 5 chronic kidney disease or end stage renal disease: Secondary | ICD-10-CM | POA: Diagnosis present

## 2020-04-13 NOTE — ED Triage Notes (Addendum)
Pt comes POV from Dialysis for HTN during treatment.  Pt states her BP kept climbing, reaching as high as 230/119.  PT finished approx. 3.5 hrs of her normal 4 hr treatment.   Pt complains of 6/10 HA with some blurry vision. BP is coming down at time of triage.

## 2020-04-13 NOTE — ED Notes (Signed)
Received report from [facility] at this time

## 2020-04-13 NOTE — ED Notes (Signed)
RN made aware of unsuccessful blood draw.

## 2020-04-13 NOTE — ED Provider Notes (Signed)
[facility] EMERGENCY DEPARTMENT Provider Note   CSN: [id number] Arrival date & time: 04/13/20  1638     History No chief complaint on file.   Patricia [name] is a 46 y.o. female with PMH/o dialysis (T, TH, Sat) who was at dialysis today and they noted her blood pressures to be elevated in the 200s.  They stopped her dialysis session with about 37 minutes left.  They sent her to the emergency department for further evaluation.  She does report that she went home and measured her blood pressure again to see if it was elevated.  She noted blood pressure in the 200s and came to the ED.  She does report that she has taken all but her isordil today.  She does report she has a slight headache but otherwise denies any symptoms.  She has not had any recent fevers, cough, chills.  Denies any chest pain, vomiting, vision changes, shortness of breath, numbness/weakness of arms or legs.  The history is provided by the patient.       Past Medical History:  Diagnosis Date  . Acid reflux   . Anemia 04/2019   REQUIRING BLOOD TRANSFUSIONS  . Anxiety   . Arthritis   . Dyspnea   . Dysrhythmia    tachycardia  . ESRD (end stage renal disease) ([place])    [place]THSAT- [place]  . GI bleeding 12/2017  . Gout   . Headache(784.0)    "related to chemo; sometimes weekly" (09/12/2013)  . High cholesterol   . History of blood transfusion "a couple"   "related to low counts"  . Hypertension   . Iron deficiency anemia    "get epogen shots q month" (02/20/2014)  . 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 Peritoneal dialysis status ([place])   . Stroke [facility]) 08/15/2017    Patient Active Problem List   Diagnosis Date Noted  . Malnutrition of moderate degree 03/13/2020  . Hypertensive crisis without congestive heart failure 03/11/2020  . 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 Pericolonic abscess 02/06/2020  . Chest pain 05/10/2019  . Anemia  04/25/2019  . SVT (supraventricular tachycardia) ([place]) 04/25/2019  . Elevated troponin 04/25/2019  . ESRD on hemodialysis ([place]) 04/25/2019  . Acute on chronic blood loss anemia 05/21/2018  . GI bleed 05/20/2018  . Symptomatic anemia 10/28/2017  . ESRD on peritoneal dialysis ([place]) 04/01/2016  . Anemia due to chronic kidney disease 04/01/2016  . GERD (gastroesophageal reflux disease) 04/01/2016  . Atypical chest pain   . Nephrotic syndrome 10/02/2013  . Dyslipidemia 10/02/2013  . PAT (paroxysmal atrial tachycardia) ([place]) 09/29/2013  . Normocytic anemia 06/29/2013  . Glomerulonephritis 01/28/2013  . Morbid obesity ([place]) 07/04/2011  . Hypertension 07/04/2011  . Hypercholesterolemia 07/04/2011    Past Surgical History:  Procedure Laterality Date  . ABDOMINAL HYSTERECTOMY  2010   "laparoscopic"  . ANKLE FRACTURE SURGERY Right 1994  . AV FISTULA PLACEMENT Left 04/14/2016   Procedure: ARTERIOVENOUS (AV) FISTULA CREATION;  Surgeon: Angelia Mould, MD;  [facility] OR;  Service: Vascular;  Laterality: Left;  . AV FISTULA PLACEMENT Left 08/09/2017   Procedure: INSERTION OF ARTERIOVENOUS (AV) GORE-TEX GRAFT LEFT ARM;  Surgeon: Elam Dutch, MD;  Location: [facility] OR;  Service: Vascular;  Laterality: Left;  . AV FISTULA PLACEMENT Right 03/15/2020   Procedure: RIGHT ARM ARTERIOVENOUS (AV) FISTULA CREATION 1st Stage Basilic Transposition;  Surgeon: Serafina Mitchell, MD;  Location: [facility]  OR;  Service: Vascular;  Laterality: Right;  . AVGG REMOVAL Left 08/11/2017   Procedure: REMOVAL OF ARTERIOVENOUS GORETEX GRAFT (AVGG);  Surgeon: Nada Libman, MD;  Location: [facility] OR;  Service: Vascular;  Laterality: Left;  . BIOPSY  09/03/2017   Procedure: BIOPSY;  Surgeon: Kathi Der, MD;  Location: [facility] ENDOSCOPY;  Service: Gastroenterology;;  . BIOPSY  12/24/2017   Procedure: BIOPSY;  Surgeon: Lemar Lofty., MD;  Location: [facility] ENDOSCOPY;  Service: Gastroenterology;;  . CARDIAC CATHETERIZATION   2000's  . COLONOSCOPY WITH PROPOFOL N/A 09/04/2017   Procedure: COLONOSCOPY WITH PROPOFOL;  Surgeon: Kerin Salen, MD;  Location: [facility] ENDOSCOPY;  Service: Gastroenterology;  Laterality: N/A;  . ENTEROSCOPY N/A 12/24/2017   Procedure: ENTEROSCOPY;  Surgeon: Meridee Score Netty Starring., MD;  Location: [facility] ENDOSCOPY;  Service: Gastroenterology;  Laterality: N/A;  . ESOPHAGOGASTRODUODENOSCOPY    . ESOPHAGOGASTRODUODENOSCOPY (EGD) WITH PROPOFOL Left 06/04/2017   Procedure: ESOPHAGOGASTRODUODENOSCOPY (EGD) WITH PROPOFOL;  Surgeon: Willis Modena, MD;  Location: [facility] ENDOSCOPY;  Service: Endoscopy;  Laterality: Left;  . ESOPHAGOGASTRODUODENOSCOPY (EGD) WITH PROPOFOL N/A 09/03/2017   Procedure: ESOPHAGOGASTRODUODENOSCOPY (EGD) WITH PROPOFOL;  Surgeon: Kathi Der, MD;  Location: [facility] ENDOSCOPY;  Service: Gastroenterology;  Laterality: N/A;  . FISTULA SUPERFICIALIZATION Left 04/02/2017   Procedure: FISTULA SUPERFICIALIZATION LEFT ARM;  Surgeon: Nada Libman, MD;  Location: [facility] OR;  Service: Vascular;  Laterality: Left;  . FISTULOGRAM Left 04/07/2016   Procedure: FISTULOGRAM;  Surgeon: Maeola Harman, MD;  Location: [facility] OR;  Service: Vascular;  Laterality: Left;  . FRACTURE SURGERY     right ankle  . GIVENS CAPSULE STUDY N/A 10/29/2017   Procedure: GIVENS CAPSULE STUDY;  Surgeon: Charlott Rakes, MD;  Location: [facility] ENDOSCOPY;  Service: Endoscopy;  Laterality: N/A;  . GIVENS CAPSULE STUDY N/A 12/24/2017   Procedure: GIVENS CAPSULE STUDY;  Surgeon: Lemar Lofty., MD;  Location: [facility] ENDOSCOPY;  Service: Gastroenterology;  Laterality: N/A;  . GIVENS CAPSULE STUDY N/A 05/22/2018   Procedure: GIVENS CAPSULE STUDY;  Surgeon: Lemar Lofty., MD;  Location: [facility] ENDOSCOPY;  Service: Gastroenterology;  Laterality: N/A;  . INSERTION OF DIALYSIS CATHETER Right 04/07/2016   Procedure: INSERTION OF DIALYSIS CATHETER;  Surgeon: Maeola Harman, MD;  Location: [facility] OR;  Service: Vascular;   Laterality: Right;  . INSERTION OF DIALYSIS CATHETER Right 04/04/2017   Procedure: INSERTION OF DIALYSIS CATHETER;  Surgeon: Maeola Harman, MD;  Location: [facility] OR;  Service: Vascular;  Laterality: Right;  . IR FLUORO GUIDE CV LINE LEFT  03/13/2020  . IR US GUIDE VASC ACCESS LEFT  03/13/2020  . LEFT HEART CATH AND CORONARY ANGIOGRAPHY N/A 05/12/2019   Procedure: LEFT HEART CATH AND CORONARY ANGIOGRAPHY;  Surgeon: Orpah Cobb, MD;  Location: [facility] INVASIVE CV LAB;  Service: Cardiovascular;  Laterality: N/A;  . LIGATION OF ARTERIOVENOUS  FISTULA Left 04/14/2016   Procedure: LIGATION OF ARTERIOVENOUS  FISTULA;  Surgeon: Chuck Hint, MD;  Location: [facility] OR;  Service: Vascular;  Laterality: Left;  . PATCH ANGIOPLASTY Left 06/19/2016   Procedure: PATCH ANGIOPLASTY LEFT ARTERIOVENOUS FISTULA;  Surgeon: Chuck Hint, MD;  Location: [facility] OR;  Service: Vascular;  Laterality: Left;  . POLYPECTOMY  09/04/2017   Procedure: POLYPECTOMY;  Surgeon: Kerin Salen, MD;  Location: [facility] ENDOSCOPY;  Service: Gastroenterology;;  . REVISON OF ARTERIOVENOUS FISTULA Left 06/19/2016   Procedure: REVISION SUPERFICIALIZATION OF BRACHIOCEPHALIC ARTERIOVENOUS FISTULA;  Surgeon: Chuck Hint, MD;  Location: [facility] OR;  Service: Vascular;  Laterality: Left;     OB History  Gravida  3   Para      Term      Preterm      AB      Living        SAB      IAB      Ectopic      Multiple  3   Live Births              Family History  Problem Relation Age of Onset  . Hypertension Mother   . Thyroid disease Mother   . Coronary artery disease Father   . Hypertension Father   . Diabetes Father   . Colon cancer Maternal Aunt   . Esophageal cancer Maternal Uncle   . Inflammatory bowel disease Neg Hx   . Liver disease Neg Hx   . Pancreatic cancer Neg Hx   . Rectal cancer Neg Hx   . Stomach cancer Neg Hx     Social History   Tobacco Use  . Smoking status: Never Smoker  . Smokeless  tobacco: Never Used  Vaping Use  . Vaping Use: Never used  Substance Use Topics  . Alcohol use: No  . Drug use: No    Home Medications Prior to Admission medications   Medication Sig Start Date End Date Taking? Authorizing Provider  acetaminophen (TYLENOL) 325 MG tablet Take 2 tablets (650 mg total) by mouth every 6 (six) hours as needed for mild pain (or Fever >/= 101). 03/18/20  Yes Arrien, Jimmy Picket, MD  allopurinol (ZYLOPRIM) 100 MG tablet Take 100 mg by mouth at bedtime.    Yes [provider]  ALPRAZolam Duanne Moron) 1 MG tablet Take 1 mg by mouth 2 (two) times daily as needed for anxiety.  03/19/17  Yes [provider]  amLODipine (NORVASC) 10 MG tablet Take 1 tablet (10 mg total) by mouth daily. 03/18/20 04/17/20 Yes Arrien, Jimmy Picket, MD  atorvastatin (LIPITOR) 40 MG tablet Take 1 tablet (40 mg total) by mouth daily at 6 PM. 05/13/19  Yes Dixie Dials, MD  hydrOXYzine (ATARAX/VISTARIL) 25 MG tablet Take 25 mg by mouth every 6 (six) hours as needed for itching. 08/16/19  Yes [provider]  isosorbide dinitrate (ISORDIL) 10 MG tablet Take 1 tablet (10 mg total) by mouth 3 (three) times daily. 03/18/20 04/17/20 Yes Arrien, Jimmy Picket, MD  Lanthanum Carbonate (FOSRENOL) 1000 MG PACK Take 1 packet by mouth with breakfast, with lunch, and with evening meal. 03/04/20  Yes [provider]  lisinopril (ZESTRIL) 20 MG tablet Take 1 tablet (20 mg total) by mouth at bedtime. 03/18/20 04/17/20 Yes Arrien, Jimmy Picket, MD  LYRICA 25 MG capsule Take 25 mg by mouth at bedtime. 04/11/20  Yes [provider]  metoprolol tartrate (LOPRESSOR) 100 MG tablet Take 1 tablet (100 mg total) by mouth 2 (two) times daily. 03/18/20 04/17/20 Yes Arrien, Jimmy Picket, MD  multivitamin (RENA-VIT) TABS tablet Take 1 tablet by mouth at bedtime.    Yes [provider]  omeprazole (PRILOSEC) 40 MG capsule Take 40 mg by mouth at bedtime.   Yes [provider]  promethazine (PHENERGAN) 25 MG tablet Take 1 tablet (25 mg total) by mouth every 8 (eight) hours as needed for nausea or vomiting. 04/07/18  Yes Enid Derry, [place], DO  zolpidem (AMBIEN) 5 MG tablet TAKE 1 TABLET (5 MG TOTAL) BY MOUTH AT BEDTIME AS NEEDED FOR SLEEP. Patient taking differently: Take 5 mg by mouth at bedtime as needed for sleep. 02/16/20  08/14/20 Yes Rolly Salter, MD  amoxicillin-clavulanate (AUGMENTIN) 500-125 MG tablet TAKE 1 TABLET (500 MG TOTAL) BY MOUTH EVERY EVENING. Patient not taking: No sig reported 02/16/20 02/15/21  Rolly Salter, MD  hydrALAZINE (APRESOLINE) 100 MG tablet Take 1 tablet (100 mg total) by mouth every 8 (eight) hours. Patient not taking: Reported on 04/13/2020 03/18/20 04/17/20  Arrien, York Ram, MD  Nutritional Supplements (,FEEDING SUPPLEMENT, PROSOURCE PLUS) liquid Take 30 mLs by mouth 2 (two) times daily between meals. Patient not taking: Reported on 04/13/2020 03/18/20 04/17/20  Arrien, York Ram, MD  Nutritional Supplements (FEEDING SUPPLEMENT, NEPRO CARB STEADY,) LIQD Take 237 mLs by mouth 3 (three) times daily between meals. Patient not taking: Reported on 04/13/2020 03/18/20 04/17/20  Arrien, York Ram, MD  saccharomyces boulardii (FLORASTOR) 250 MG capsule TAKE 1 CAPSULE (250 MG TOTAL) BY MOUTH TWO TIMES DAILY. Patient not taking: Reported on 04/13/2020 02/16/20 02/15/21  Rolly Salter, MD  zolpidem (AMBIEN) 5 MG tablet Take 1 tablet (5 mg total) by mouth at bedtime as needed for sleep. Patient not taking: No sig reported 03/19/20   Arrien, York Ram, MD    Allergies    Nsaids and Tolmetin  Review of Systems   Review of Systems  Constitutional: Negative for fever.  Respiratory: Negative for shortness of breath.   Cardiovascular: Negative for chest pain.  Gastrointestinal: Negative for abdominal pain, nausea and vomiting.  Genitourinary: Negative for hematuria.  Neurological: Positive for headaches. Negative for  weakness and numbness.  All other systems reviewed and are negative.   Physical Exam Updated Vital Signs BP (!) 171/84   Pulse 66   Temp 99.1 F (37.3 C) (Oral)   Resp 19   Wt 93.4 kg   SpO2 100%   BMI 32.26 kg/m   Physical Exam Vitals and nursing note reviewed.  Constitutional:      Appearance: Normal appearance. She is well-developed.  HENT:     Head: Normocephalic and atraumatic.  Eyes:     General: Lids are normal.     Conjunctiva/sclera: Conjunctivae normal.     Pupils: Pupils are equal, round, and reactive to light.     Comments: Pupils are equal and reactive bilaterally.  She does exhibit some myoclonic eye movements.   Cardiovascular:     Rate and Rhythm: Normal rate and regular rhythm.     Pulses: Normal pulses.     Heart sounds: Normal heart sounds. No murmur heard. No friction rub. No gallop.      Comments: Fistula noted in the right upper extremity.  No thrill noted.  She reports has not been accessed yet. Pulmonary:     Effort: Pulmonary effort is normal.     Breath sounds: Normal breath sounds.     Comments: Lungs clear to auscultation bilaterally.  Symmetric chest rise.  No wheezing, rales, rhonchi. Chest:     Comments: Port noted to the left anterior chest. Abdominal:     Palpations: Abdomen is soft. Abdomen is not rigid.     Tenderness: There is no abdominal tenderness. There is no guarding.  Musculoskeletal:        General: Normal range of motion.     Cervical back: Full passive range of motion without pain.  Skin:    General: Skin is warm and dry.     Capillary Refill: Capillary refill takes less than 2 seconds.  Neurological:     Mental Status: She is alert and oriented to person, place, and time.  Comments: Cranial nerves III-XII intact Follows commands, Moves all extremities  5/5 strength to BUE and BLE  Sensation intact throughout all major nerve distributions No slurred speech. No facial droop.   Psychiatric:        Speech: Speech  normal.     ED Results / Procedures / Treatments   Labs (all labs ordered are listed, but only abnormal results are displayed) Labs Reviewed  COMPREHENSIVE METABOLIC PANEL  CBC WITH DIFFERENTIAL/PLATELET    EKG EKG Interpretation  Date/Time:  Saturday April 13 2020 17:08:42 EDT Ventricular Rate:  66 PR Interval:  159 QRS Duration: 99 QT Interval:  456 QTC Calculation: 478 R Axis:   67 Text Interpretation: Sinus rhythm No significant change since last tracing Confirmed by Dorie Rank [phone]) on 04/13/2020 5:13:05 PM   Radiology No results found.  Procedures Procedures   Medications Ordered in ED Medications - No data to display  ED Course  I have reviewed the triage vital signs and the nursing notes.  Pertinent labs & imaging results that were available during my care of the patient were reviewed by me and considered in my medical decision making (see chart for details).    MDM Rules/Calculators/A&P                          45 year old female who presents for evaluation of hypertension.  She reports she was at dialysis when they noted her blood pressure be elevated in 200s.  They stopped it with about 37 minutes left and she was told to go to the emergency department.  She does report she went home first and stated it was still elevated, prompting ED visit.  On initial arrival, she is afebrile, toxic appearing.  Blood pressure is 168/84.  She she has a slight headache but otherwise denies any other symptoms.  On exam, she has no neuro deficits.  No evidence of respiratory distress.  She does exhibit some abnormal eye movements.  There seems to be distractibility component to this.  When I start talking to her, they will stop.  I reviewed her record and this has been well documented in her previous chart.  She reports that it comes and goes.  She states that sometimes happens when she does not feel well or gets sick.  Her exam is not consistent with hypertensive emergency.  Do  not suspect she is fluid overloaded as she just had dialysis and does not have any shortness of breath.  We will plan to monitor, check basic labs given her dialysis.  Patient's blood pressure is improved here in the ED without any intervention.  She is down to systolic blood pressure in the 150s.  She states she feels good.  She states that she has a slight headache but otherwise denies any other symptoms.  We discussed obtaining blood work.  She is a dialysis patient and I discussed with her checking electrolytes.  She did have dialysis today and completed 3.5 hours of it so I suspect that her electrolytes are within normal limits.  We discussed utility of continued work-up here in the ED.  Patient states she feels good and would prefer to go home.  At this time, I feel this is reasonable.  She is hemodynamically stable, able to ambulate in the ED without any difficulty.  At this time, patient exhibits no emergent life-threatening condition that require further evaluation in ED. Patient had ample opportunity for questions and discussion. All patient's  questions were answered with full understanding. Strict return precautions discussed. Patient expresses understanding and agreement to plan.   Portions of this note were generated with [employer]. Dictation errors may occur despite best attempts at proofreading.   Final Clinical Impression(s) / ED Diagnoses Final diagnoses:  Hypertension, unspecified type    Rx / DC Orders ED Discharge Orders    None       Desma Mcgregor 04/14/20 0005    Dorie Rank, MD 04/14/20 1451

## 2020-04-13 NOTE — ED Notes (Signed)
IV team to bedside. 

## 2020-04-13 NOTE — ED Notes (Signed)
Sent provider a message reference d/c pt before we get lab results back

## 2020-04-13 NOTE — Discharge Instructions (Signed)
As we discussed, it is important that you take your blood pressure medication.  Make sure not missing any doses.  Make sure you go to your next dialysis session as scheduled.  Return the emergency department immediately if you have any chest pain, difficulty breathing, vomiting, difficulty moving your arms or legs or any other worsening concerning symptoms.

## 2020-04-26 DIAGNOSIS — T85611A Breakdown (mechanical) of intraperitoneal dialysis catheter, initial encounter: Secondary | ICD-10-CM | POA: Insufficient documentation

## 2020-04-29 ENCOUNTER — Other Ambulatory Visit: Payer: Self-pay

## 2020-04-29 ENCOUNTER — Ambulatory Visit (INDEPENDENT_AMBULATORY_CARE_PROVIDER_SITE_OTHER): Payer: Medicare Other | Admitting: Physician Assistant

## 2020-04-29 ENCOUNTER — Ambulatory Visit (HOSPITAL_COMMUNITY)
Admission: RE | Admit: 2020-04-29 | Discharge: 2020-04-29 | Disposition: A | Discharge status: Home / Self Care | Payer: Medicare Other | Source: Ambulatory Visit | Attending: Surgery | Admitting: Surgery

## 2020-04-29 ENCOUNTER — Encounter: Payer: Self-pay | Admitting: Physician Assistant

## 2020-04-29 VITALS — BP 127/78 | HR 81 | Temp 98.6°F | Resp 20 | Ht 67.0 in | Wt 212.3 lb

## 2020-04-29 DIAGNOSIS — N186 End stage renal disease: Secondary | ICD-10-CM | POA: Diagnosis present

## 2020-04-29 DIAGNOSIS — Z992 Dependence on renal dialysis: Secondary | ICD-10-CM | POA: Diagnosis present

## 2020-04-29 NOTE — H&P (View-Only) (Signed)
  Postoperative Access Visit   History of Present Illness   Patricia [name] is a 45 y.o. year old female who presents for postoperative follow-up for first stage right basilic vein fistula creation by Dr. Brabham on 03/15/20. The patient's wounds are well healed.  The patient notes no steal symptoms.  The patient is able to complete their activities of daily living.   She is currently dialyzing via left IJ TDC at the [facility] in [place] on Tues/ Thurs/ Sat  Physical Examination   Vitals:   04/29/20 1045  BP: 127/78  Pulse: 81  Resp: 20  Temp: 98.6 F (37 C)  TempSrc: Temporal  SpO2: 100%  Weight: 212 lb 4.8 oz (96.3 kg)  Height: 5' 7" (1.702 m)   Body mass index is 33.25 kg/m.  right arm Incision is well healed, 2+ radial pulse, hand grip is 5/5, sensation in digits is  intact, palpable thrill, bruit can be auscultated     VAS US Duplex Dialysis Access: 04/29/20 Findings:  +--------------------+----------+-----------------+--------+  AVF         PSV (cm/s)Flow Vol (mL/min)Comments  +--------------------+----------+-----------------+--------+  Native artery inflow  232     1148          +--------------------+----------+-----------------+--------+  AVF Anastomosis     664                 +--------------------+----------+-----------------+--------+   +------------+----------+-------------+----------+------------------+  OUTFLOW VEINPSV (cm/s)Diameter (cm)Depth (cm)   Describe     +------------+----------+-------------+----------+------------------+  Prox UA     115    0.52     2.49             +------------+----------+-------------+----------+------------------+  Mid UA     168    0.62     1.99   competing branch   +------------+----------+-------------+----------+------------------+  Dist UA     449    0.27     2.10  change  in Diameter  +------------+----------+-------------+----------+------------------+  AC Fossa    826    0.27     1.07             +------------+----------+-------------+----------+------------------+   Summary:  Patent arteriovenous fistula with one competing branch observed. Change in vessel diameter (approximately 1.7 cm in length) involving the basilic outflow vein at the distal upper arm resulting in increased velocities.     Medical Decision Making    Patricia [name] is a 45 y.o. year old female who presents s/p first stage right basilic vein fistula creation by Dr. Brabham on 03/15/20. Patent is without signs or symptoms of steal syndrome. Her duplex today shows good volume flow however her fistula is both deep and has not fully matured to adequate diameter. She has some competing branches noted. I have discussed with her and her daughter that she needs a 2nd stage BV transposition and branch ligation. Risk, benefits, and alternatives to access surgery were discussed.  The patient is aware the risks include but are not limited to: bleeding, infection, steal syndrome, nerve damage, thrombosis, failure to mature, and need for additional procedures.  The patient agrees to proceed with the procedure. _  I also have encouraged her to continue to exercise her arm to help it mature.  - She dialyzes on Tues/ Thurs/ Sat and I have discussed that her surgery will try to be arranged around her dialysis schedule but that it may have to be changed to accommodate her surgery.  - I will schedule her 2nd stage BV transposition and   branch ligation with Dr. Trula Slade at next earliest availability  Patricia Caldwell, PA-C [facility] [place]: [facility] MD: Trula Slade

## 2020-04-29 NOTE — Progress Notes (Signed)
Postoperative Access Visit   History of Present Illness   Patricia [name] is a 45 y.o. year old female who presents for postoperative follow-up for first stage right basilic vein fistula creation by Dr. Trula Slade on 03/15/20. The patient's wounds are well healed.  The patient notes no steal symptoms.  The patient is able to complete their activities of daily living.   She is currently dialyzing via left IJ TDC at the [facility] in [place] on Tues/ Thurs/ Sat  Physical Examination   Vitals:   04/29/20 1045  BP: 127/78  Pulse: 81  Resp: 20  Temp: 98.6 F (37 C)  TempSrc: Temporal  SpO2: 100%  Weight: 212 lb 4.8 oz (96.3 kg)  Height: $Remove'5\' 7"'TDGrpLp$  (1.702 m)   Body mass index is 33.25 kg/m.  right arm Incision is well healed, 2+ radial pulse, hand grip is 5/5, sensation in digits is  intact, palpable thrill, bruit can be auscultated     VAS US Duplex Dialysis Access: 04/29/20 Findings:  +--------------------+----------+-----------------+--------+  AVF         PSV (cm/s)Flow Vol (mL/min)Comments  +--------------------+----------+-----------------+--------+  Native artery inflow  232     1148          +--------------------+----------+-----------------+--------+  AVF Anastomosis     664                 +--------------------+----------+-----------------+--------+   +------------+----------+-------------+----------+------------------+  OUTFLOW VEINPSV (cm/s)Diameter (cm)Depth (cm)   Describe     +------------+----------+-------------+----------+------------------+  Prox UA     115    0.52     2.49             +------------+----------+-------------+----------+------------------+  Mid UA     168    0.62     1.99   competing branch   +------------+----------+-------------+----------+------------------+  Dist UA     449    0.27     2.10  change  in Diameter  +------------+----------+-------------+----------+------------------+  AC Fossa    826    0.27     1.07             +------------+----------+-------------+----------+------------------+   Summary:  Patent arteriovenous fistula with one competing branch observed. Change in vessel diameter (approximately 1.7 cm in length) involving the basilic outflow vein at the distal upper arm resulting in increased velocities.     Medical Decision Making    Patricia [name] is a 45 y.o. year old female who presents s/p first stage right basilic vein fistula creation by Dr. Trula Slade on 03/15/20. Patent is without signs or symptoms of steal syndrome. Her duplex today shows good volume flow however her fistula is both deep and has not fully matured to adequate diameter. She has some competing branches noted. I have discussed with her and her daughter that she needs a 2nd stage BV transposition and branch ligation. Risk, benefits, and alternatives to access surgery were discussed.  The patient is aware the risks include but are not limited to: bleeding, infection, steal syndrome, nerve damage, thrombosis, failure to mature, and need for additional procedures.  The patient agrees to proceed with the procedure. _  I also have encouraged her to continue to exercise her arm to help it mature.  - She dialyzes on Tues/ Thurs/ Sat and I have discussed that her surgery will try to be arranged around her dialysis schedule but that it may have to be changed to accommodate her surgery.  - I will schedule her 2nd stage BV transposition and  branch ligation with Dr. Trula Slade at next earliest availability  Patricia Caldwell, PA-C [facility] [place]: [facility] MD: Trula Slade

## 2020-05-06 ENCOUNTER — Other Ambulatory Visit: Payer: Self-pay

## 2020-05-09 ENCOUNTER — Other Ambulatory Visit: Payer: Medicare Other

## 2020-05-15 ENCOUNTER — Other Ambulatory Visit (HOSPITAL_COMMUNITY): Payer: Self-pay

## 2020-05-15 ENCOUNTER — Other Ambulatory Visit (HOSPITAL_COMMUNITY)
Admission: RE | Admit: 2020-05-15 | Discharge: 2020-05-15 | Disposition: A | Discharge status: Home / Self Care | Payer: Medicare Other | Source: Ambulatory Visit | Attending: Surgery | Admitting: Surgery

## 2020-05-15 DIAGNOSIS — Z01812 Encounter for preprocedural laboratory examination: Secondary | ICD-10-CM | POA: Insufficient documentation

## 2020-05-15 DIAGNOSIS — Z20822 Contact with and (suspected) exposure to covid-19: Secondary | ICD-10-CM | POA: Insufficient documentation

## 2020-05-15 LAB — SARS CORONAVIRUS 2 (TAT 6-24 HRS): SARS Coronavirus 2: NEGATIVE

## 2020-05-16 ENCOUNTER — Encounter (HOSPITAL_COMMUNITY): Payer: Self-pay | Admitting: Surgery

## 2020-05-16 ENCOUNTER — Other Ambulatory Visit: Payer: Self-pay

## 2020-05-16 MED ORDER — LIDOCAINE-PRILOCAINE 2.5-2.5 % EX CREA
1.0000 "application " | TOPICAL_CREAM | CUTANEOUS | Status: DC | PRN
  Filled 2020-05-16: qty 5

## 2020-05-16 MED ORDER — SODIUM CHLORIDE 0.9 % IV SOLN: 100.0000 mL | INTRAVENOUS | Status: DC | PRN

## 2020-05-16 MED ORDER — HEPARIN SODIUM (PORCINE) 1000 UNIT/ML DIALYSIS
2000.0000 [IU] | INTRAMUSCULAR | Status: DC | PRN
  Filled 2020-05-16: qty 2

## 2020-05-16 MED ORDER — ALTEPLASE 2 MG IJ SOLR
2.0000 mg | Freq: Once | INTRAMUSCULAR | Status: DC | PRN
  Filled 2020-05-16: qty 2

## 2020-05-16 MED ORDER — HEPARIN SODIUM (PORCINE) 1000 UNIT/ML DIALYSIS
1000.0000 [IU] | INTRAMUSCULAR | Status: DC | PRN
  Filled 2020-05-16: qty 1

## 2020-05-16 MED ORDER — LIDOCAINE HCL (PF) 1 % IJ SOLN
5.0000 mL | INTRAMUSCULAR | Status: DC | PRN
  Filled 2020-05-16: qty 5

## 2020-05-16 MED ORDER — PENTAFLUOROPROP-TETRAFLUOROETH EX AERO
1.0000 "application " | INHALATION_SPRAY | CUTANEOUS | Status: DC | PRN
  Filled 2020-05-16: qty 116

## 2020-05-16 NOTE — Progress Notes (Signed)
Patricia [name] denies chest pain or shortness of breath. Patient was tested for Covid and has been in quarantine since that time.

## 2020-05-17 ENCOUNTER — Ambulatory Visit (HOSPITAL_COMMUNITY)
Admission: RE | Admit: 2020-05-17 | Discharge: 2020-05-17 | Disposition: A | Discharge status: Home / Self Care | Payer: Medicare Other | Attending: Surgery | Admitting: Surgery

## 2020-05-17 ENCOUNTER — Encounter (HOSPITAL_COMMUNITY)
Admission: RE | Disposition: A | Discharge status: Home / Self Care | Payer: Self-pay | Source: Home / Self Care | Attending: Surgery

## 2020-05-17 ENCOUNTER — Encounter (HOSPITAL_COMMUNITY): Payer: Self-pay | Admitting: Surgery

## 2020-05-17 ENCOUNTER — Ambulatory Visit (HOSPITAL_COMMUNITY): Payer: Medicare Other | Admitting: Physician Assistant

## 2020-05-17 DIAGNOSIS — Z992 Dependence on renal dialysis: Secondary | ICD-10-CM | POA: Diagnosis not present

## 2020-05-17 DIAGNOSIS — N186 End stage renal disease: Secondary | ICD-10-CM | POA: Insufficient documentation

## 2020-05-17 DIAGNOSIS — Z8673 Personal history of transient ischemic attack (TIA), and cerebral infarction without residual deficits: Secondary | ICD-10-CM | POA: Diagnosis not present

## 2020-05-17 DIAGNOSIS — E1122 Type 2 diabetes mellitus with diabetic chronic kidney disease: Secondary | ICD-10-CM | POA: Insufficient documentation

## 2020-05-17 DIAGNOSIS — K219 Gastro-esophageal reflux disease without esophagitis: Secondary | ICD-10-CM | POA: Insufficient documentation

## 2020-05-17 DIAGNOSIS — I120 Hypertensive chronic kidney disease with stage 5 chronic kidney disease or end stage renal disease: Secondary | ICD-10-CM | POA: Diagnosis not present

## 2020-05-17 DIAGNOSIS — N185 Chronic kidney disease, stage 5: Secondary | ICD-10-CM | POA: Diagnosis not present

## 2020-05-17 HISTORY — PX: LIGATION OF COMPETING BRANCHES OF ARTERIOVENOUS FISTULA: SHX5949

## 2020-05-17 HISTORY — PX: BASCILIC VEIN TRANSPOSITION: SHX5742

## 2020-05-17 HISTORY — DX: Cardiac murmur, unspecified: R01.1

## 2020-05-17 LAB — POCT I-STAT, CHEM 8
BUN: 35 mg/dL — ABNORMAL HIGH (ref 6–20)
Calcium, Ion: 1.28 mmol/L (ref 1.15–1.40)
Chloride: 101 mmol/L (ref 98–111)
Creatinine, Ser: 7 mg/dL — ABNORMAL HIGH (ref 0.44–1.00)
Glucose, Bld: 99 mg/dL (ref 70–99)
HCT: 29 % — ABNORMAL LOW (ref 36.0–46.0)
Hemoglobin: 9.9 g/dL — ABNORMAL LOW (ref 12.0–15.0)
Potassium: 5.2 mmol/L — ABNORMAL HIGH (ref 3.5–5.1)
Sodium: 137 mmol/L (ref 135–145)
TCO2: 29 mmol/L (ref 22–32)

## 2020-05-17 SURGERY — TRANSPOSITION, VEIN, BASILIC
Anesthesia: General | Site: Arm Upper | Laterality: Right

## 2020-05-17 MED ORDER — 0.9 % SODIUM CHLORIDE (POUR BTL) OPTIME
TOPICAL | Status: DC | PRN
  Administered 2020-05-17: 1000 mL

## 2020-05-17 MED ORDER — LIDOCAINE 2% (20 MG/ML) 5 ML SYRINGE
INTRAMUSCULAR | Status: AC
  Filled 2020-05-17: qty 5

## 2020-05-17 MED ORDER — PROPOFOL 10 MG/ML IV BOLUS
INTRAVENOUS | Status: DC | PRN
  Administered 2020-05-17: 40 mg via INTRAVENOUS
  Administered 2020-05-17: 160 mg via INTRAVENOUS

## 2020-05-17 MED ORDER — FENTANYL CITRATE (PF) 100 MCG/2ML IJ SOLN: 25.0000 ug | INTRAMUSCULAR | Status: DC | PRN

## 2020-05-17 MED ORDER — CEFAZOLIN SODIUM-DEXTROSE 2-4 GM/100ML-% IV SOLN
2.0000 g | INTRAVENOUS | Status: AC
  Administered 2020-05-17: 2 g via INTRAVENOUS
  Filled 2020-05-17: qty 100

## 2020-05-17 MED ORDER — CHLORHEXIDINE GLUCONATE 4 % EX LIQD: 60.0000 mL | Freq: Once | CUTANEOUS | Status: DC

## 2020-05-17 MED ORDER — OXYCODONE HCL 5 MG/5ML PO SOLN: 5.0000 mg | Freq: Once | ORAL | Status: AC | PRN

## 2020-05-17 MED ORDER — ROCURONIUM BROMIDE 10 MG/ML (PF) SYRINGE
PREFILLED_SYRINGE | INTRAVENOUS | Status: AC
  Filled 2020-05-17: qty 10

## 2020-05-17 MED ORDER — PROPOFOL 10 MG/ML IV BOLUS
INTRAVENOUS | Status: AC
  Filled 2020-05-17: qty 20

## 2020-05-17 MED ORDER — CHLORHEXIDINE GLUCONATE 0.12 % MT SOLN
OROMUCOSAL | Status: AC
  Administered 2020-05-17: 15 mL via OROMUCOSAL
  Filled 2020-05-17: qty 15

## 2020-05-17 MED ORDER — ONDANSETRON HCL 4 MG/2ML IJ SOLN
INTRAMUSCULAR | Status: DC | PRN
  Administered 2020-05-17: 4 mg via INTRAVENOUS

## 2020-05-17 MED ORDER — BUPIVACAINE HCL (PF) 0.5 % IJ SOLN
INTRAMUSCULAR | Status: AC
  Filled 2020-05-17: qty 30

## 2020-05-17 MED ORDER — SODIUM CHLORIDE 0.9 % IV SOLN: INTRAVENOUS | Status: DC

## 2020-05-17 MED ORDER — BUPIVACAINE LIPOSOME 1.3 % IJ SUSP
INTRAMUSCULAR | Status: DC | PRN
  Administered 2020-05-17: 20 mL

## 2020-05-17 MED ORDER — MIDAZOLAM HCL 2 MG/2ML IJ SOLN
INTRAMUSCULAR | Status: AC
  Filled 2020-05-17: qty 2

## 2020-05-17 MED ORDER — OXYCODONE HCL 5 MG PO TABS
5.0000 mg | ORAL_TABLET | Freq: Once | ORAL | Status: AC | PRN
  Administered 2020-05-17: 5 mg via ORAL

## 2020-05-17 MED ORDER — BUPIVACAINE HCL 0.5 % IJ SOLN
INTRAMUSCULAR | Status: DC | PRN
  Administered 2020-05-17: 30 mL

## 2020-05-17 MED ORDER — DEXAMETHASONE SODIUM PHOSPHATE 10 MG/ML IJ SOLN
INTRAMUSCULAR | Status: DC | PRN
  Administered 2020-05-17: 8 mg via INTRAVENOUS

## 2020-05-17 MED ORDER — FENTANYL CITRATE (PF) 250 MCG/5ML IJ SOLN
INTRAMUSCULAR | Status: AC
  Filled 2020-05-17: qty 5

## 2020-05-17 MED ORDER — DEXMEDETOMIDINE (PRECEDEX) IN NS 20 MCG/5ML (4 MCG/ML) IV SYRINGE
PREFILLED_SYRINGE | INTRAVENOUS | Status: DC | PRN
  Administered 2020-05-17: 8 ug via INTRAVENOUS

## 2020-05-17 MED ORDER — HYDROCODONE-ACETAMINOPHEN 5-325 MG PO TABS: 1.0000 | ORAL_TABLET | ORAL | 0 refills | Status: DC | PRN

## 2020-05-17 MED ORDER — DEXAMETHASONE SODIUM PHOSPHATE 10 MG/ML IJ SOLN
INTRAMUSCULAR | Status: AC
  Filled 2020-05-17: qty 1

## 2020-05-17 MED ORDER — BUPIVACAINE LIPOSOME 1.3 % IJ SUSP
INTRAMUSCULAR | Status: AC
  Filled 2020-05-17: qty 20

## 2020-05-17 MED ORDER — OXYCODONE HCL 5 MG PO TABS
ORAL_TABLET | ORAL | Status: AC
  Filled 2020-05-17: qty 1

## 2020-05-17 MED ORDER — FENTANYL CITRATE (PF) 100 MCG/2ML IJ SOLN
INTRAMUSCULAR | Status: DC | PRN
  Administered 2020-05-17 (×2): 50 ug via INTRAVENOUS
  Administered 2020-05-17: 25 ug via INTRAVENOUS
  Administered 2020-05-17: 50 ug via INTRAVENOUS
  Administered 2020-05-17: 25 ug via INTRAVENOUS

## 2020-05-17 MED ORDER — SODIUM CHLORIDE 0.9 % IV SOLN
INTRAVENOUS | Status: DC | PRN
  Administered 2020-05-17: 500 mL

## 2020-05-17 MED ORDER — ORAL CARE MOUTH RINSE: 15.0000 mL | Freq: Once | OROMUCOSAL | Status: AC

## 2020-05-17 MED ORDER — CHLORHEXIDINE GLUCONATE 0.12 % MT SOLN: 15.0000 mL | Freq: Once | OROMUCOSAL | Status: AC

## 2020-05-17 MED ORDER — ONDANSETRON HCL 4 MG/2ML IJ SOLN: 4.0000 mg | Freq: Four times a day (QID) | INTRAMUSCULAR | Status: DC | PRN

## 2020-05-17 MED ORDER — HEPARIN SODIUM (PORCINE) 1000 UNIT/ML IJ SOLN
INTRAMUSCULAR | Status: AC
  Filled 2020-05-17: qty 1

## 2020-05-17 MED ORDER — PHENYLEPHRINE HCL-NACL 10-0.9 MG/250ML-% IV SOLN
INTRAVENOUS | Status: DC | PRN
  Administered 2020-05-17: 50 ug/min via INTRAVENOUS

## 2020-05-17 MED ORDER — ONDANSETRON HCL 4 MG/2ML IJ SOLN
INTRAMUSCULAR | Status: AC
  Filled 2020-05-17: qty 2

## 2020-05-17 MED ORDER — LIDOCAINE 2% (20 MG/ML) 5 ML SYRINGE
INTRAMUSCULAR | Status: DC | PRN
  Administered 2020-05-17: 60 mg via INTRAVENOUS

## 2020-05-17 SURGICAL SUPPLY — 41 items
ARMBAND PINK RESTRICT EXTREMIT (MISCELLANEOUS) ×2 IMPLANT
CANISTER SUCT 3000ML PPV (MISCELLANEOUS) ×2 IMPLANT
CATH EMB 3FR 40CM (CATHETERS) ×2 IMPLANT
CLIP VESOCCLUDE MED 24/CT (CLIP) IMPLANT
CLIP VESOCCLUDE MED 6/CT (CLIP) ×2 IMPLANT
CLIP VESOCCLUDE SM WIDE 24/CT (CLIP) IMPLANT
CLIP VESOCCLUDE SM WIDE 6/CT (CLIP) ×2 IMPLANT
COVER PROBE W GEL 5X96 (DRAPES) ×2 IMPLANT
COVER WAND RF STERILE (DRAPES) ×2 IMPLANT
DERMABOND ADVANCED (GAUZE/BANDAGES/DRESSINGS) ×1
DERMABOND ADVANCED .7 DNX12 (GAUZE/BANDAGES/DRESSINGS) ×1 IMPLANT
ELECT REM PT RETURN 9FT ADLT (ELECTROSURGICAL) ×2
ELECTRODE REM PT RTRN 9FT ADLT (ELECTROSURGICAL) ×1 IMPLANT
GLOVE BIOGEL PI IND STRL 7.5 (GLOVE) ×1 IMPLANT
GLOVE BIOGEL PI INDICATOR 7.5 (GLOVE) ×1
GLOVE SURG SS PI 7.5 STRL IVOR (GLOVE) ×2 IMPLANT
GOWN STRL REUS W/ TWL LRG LVL3 (GOWN DISPOSABLE) ×2 IMPLANT
GOWN STRL REUS W/ TWL XL LVL3 (GOWN DISPOSABLE) ×1 IMPLANT
GOWN STRL REUS W/TWL LRG LVL3 (GOWN DISPOSABLE) ×2
GOWN STRL REUS W/TWL XL LVL3 (GOWN DISPOSABLE) ×1
HEMOSTAT SNOW SURGICEL 2X4 (HEMOSTASIS) IMPLANT
KIT BASIN OR (CUSTOM PROCEDURE TRAY) ×2 IMPLANT
KIT TURNOVER KIT B (KITS) ×2 IMPLANT
NEEDLE 18GX1X1/2 (RX/OR ONLY) (NEEDLE) ×2 IMPLANT
NS IRRIG 1000ML POUR BTL (IV SOLUTION) ×2 IMPLANT
PACK CV ACCESS (CUSTOM PROCEDURE TRAY) ×2 IMPLANT
PAD ARMBOARD 7.5X6 YLW CONV (MISCELLANEOUS) ×4 IMPLANT
SUT ETHILON 3 0 PS 1 (SUTURE) IMPLANT
SUT PROLENE 6 0 BV (SUTURE) IMPLANT
SUT PROLENE 6 0 CC (SUTURE) ×8 IMPLANT
SUT SILK 0 TIES 10X30 (SUTURE) ×2 IMPLANT
SUT SILK 2 0 SH (SUTURE) IMPLANT
SUT VIC AB 2-0 CT1 27 (SUTURE) ×1
SUT VIC AB 2-0 CT1 TAPERPNT 27 (SUTURE) ×1 IMPLANT
SUT VIC AB 3-0 SH 27 (SUTURE) ×2
SUT VIC AB 3-0 SH 27X BRD (SUTURE) ×2 IMPLANT
SUT VICRYL 4-0 PS2 18IN ABS (SUTURE) ×4 IMPLANT
SYR 30ML LL (SYRINGE) ×2 IMPLANT
TOWEL GREEN STERILE (TOWEL DISPOSABLE) ×2 IMPLANT
UNDERPAD 30X36 HEAVY ABSORB (UNDERPADS AND DIAPERS) ×2 IMPLANT
WATER STERILE IRR 1000ML POUR (IV SOLUTION) ×2 IMPLANT

## 2020-05-17 NOTE — Anesthesia Procedure Notes (Signed)
Procedure Name: LMA Insertion Date/Time: 05/17/2020 11:55 AM Performed by: Georgia Duff, CRNA Pre-anesthesia Checklist: Patient identified, Emergency Drugs available, Suction available and Patient being monitored Patient Re-evaluated:Patient Re-evaluated prior to induction Oxygen Delivery Method: Circle System Utilized Preoxygenation: Pre-oxygenation with 100% oxygen Induction Type: IV induction Ventilation: Mask ventilation without difficulty LMA: LMA inserted LMA Size: 3.0 Number of attempts: 1 Airway Equipment and Method: Bite block Placement Confirmation: positive ETCO2 Tube secured with: Tape Dental Injury: Teeth and Oropharynx as per pre-operative assessment

## 2020-05-17 NOTE — Discharge Instructions (Signed)
° °  [facility] [place] ° °Discharge Instructions ° °AV Fistula or Graft Surgery for Dialysis Access ° °Please refer to the following instructions for your post-procedure care. Your surgeon or physician assistant will discuss any changes with you. ° °Activity ° °You may drive the day following your surgery, if you are comfortable and no longer taking prescription pain medication. Resume full activity as the soreness in your incision resolves. ° °Bathing/Showering ° °You may shower after you go home. Keep your incision dry for 48 hours. Do not soak in a bathtub, hot tub, or swim until the incision heals completely. You may not shower if you have a hemodialysis catheter. ° °Incision Care ° °Clean your incision with mild soap and water after 48 hours. Pat the area dry with a clean towel. You do not need a bandage unless otherwise instructed. Do not apply any ointments or creams to your incision. You may have skin glue on your incision. Do not peel it off. It will come off on its own in about one week. Your arm may swell a bit after surgery. To reduce swelling use pillows to elevate your arm so it is above your heart. Your doctor will tell you if you need to lightly wrap your arm with an ACE bandage. ° °Diet ° °Resume your normal diet. There are not special food restrictions following this procedure. In order to heal from your surgery, it is CRITICAL to get adequate nutrition. Your body requires vitamins, minerals, and protein. Vegetables are the best source of vitamins and minerals. Vegetables also provide the perfect balance of protein. Processed food has little nutritional value, so try to avoid this. ° °Medications ° °Resume taking all of your medications. If your incision is causing pain, you may take over-the counter pain relievers such as acetaminophen (Tylenol). If you were prescribed a stronger pain medication, please be aware these medications can cause nausea and constipation. Prevent  nausea by taking the medication with a snack or meal. Avoid constipation by drinking plenty of fluids and eating foods with high amount of fiber, such as fruits, vegetables, and grains. Do not take Tylenol if you are taking prescription pain medications. ° ° ° ° °Follow up °Your surgeon may want to see you in the office following your access surgery. If so, this will be arranged at the time of your surgery. ° °Please call us immediately for any of the following conditions: ° °Increased pain, redness, drainage (pus) from your incision site °Fever of 101 degrees or higher °Severe or worsening pain at your incision site °Hand pain or numbness. ° °Reduce your risk of vascular disease: ° °Stop smoking. If you would like help, call [facility] at 1-800-QUIT-NOW (1-800-784-8669) or [place] at [phone] ° °Manage your cholesterol °Maintain a desired weight °Control your diabetes °Keep your blood pressure down ° °Dialysis ° °It will take several weeks to several months for your new dialysis access to be ready for use. Your surgeon will determine when it is OK to use it. Your nephrologist will continue to direct your dialysis. You can continue to use your Permcath until your new access is ready for use. ° °If you have any questions, please call the office at [phone]. ° °

## 2020-05-17 NOTE — Op Note (Signed)
    [name] MRN: [id number] DOB: 05/07/1975 Sex: female  05/17/2020 Pre-operative Diagnosis: End-stage renal disease Post-operative diagnosis:  Same Surgeon:  [name] Assistants: [name] Procedure:   Second stage basilic vein fistula creation Anesthesia: General Blood Loss: 100 cc Specimens: None  Findings: Ultrasound indicated a diameter change in the proximal portion of the fistula.  This section of the vein was actually rather long.  It did except a 4 dilator.  The distance was approximately 10 cm.  Because it excepted the dilator nicely and the vein was very healthy, I elected to use the entire vein for the fistula rather than placing a interposition graft.  There was a large branch associated with the change in vein caliber.  Indications: The patient is here today for second stage basilic vein fistula creation.  Procedure:  The patient was identified in the holding area and taken to [place] [facility]  The patient was then placed supine on the table. general anesthesia was administered.  The patient was prepped and draped in the usual sterile fashion.  A time out was called and antibiotics were administered.  A PA was necessary to expedite the procedure and assist with technical details.  Ultrasound was used to mark the course of the basilic vein in the upper arm.  Ultrasound evaluation indicated that the vein was of adequate caliber throughout.  I then made to transverse incisions over top of the vein in the upper arm.  Through these incisions the vein was circumferentially dissected free.  There was a large branch in the midportion of the fistula which was divided between silk ties.  Once the vein was fully mobilized it was marked for orientation.  Baby Gregory clamps were placed in the axilla and at the antecubital crease.  The vein was divided at the antecubital crease.  I filled the vein with heparin saline.  The vein did not dilate uniformly.   Proximal to the large branch, the vein was not quite as large as the more distal vein.  I was able to advance a 4-1/2 dilator without resistance.  Because this was such a long section, I felt I could either use it as is or place a interposition graft.  Because the vein appeared healthy I elected not to place a graft but to perform a primary fistula.  Next, Exparel was placed in the tunnel.  A tunnel was created using a curved tunneler and the vein graft was brought through the tunnel making sure to maintain orientation.  A end to end anastomosis was then performed with 6-0 Prolene.  Prior to completion the appropriate flushing maneuvers were performed.  I did have to pass a Fogarty catheter across the arterial venous anastomosis as there was some thrombus there.  Once this was performed there was excellent inflow.  The anastomosis was then completed.  There was a palpable thrill within the fistula as well as a palpable radial pulse.  The wound was then irrigated.  The incisions were closed with a deep layer 3-0 Vicryl and then subcuticular closure followed by Dermabond.  There were no immediate complications.   Disposition: To PACU stable.   Theotis Burrow, M.D., [facility] [place] Office: [phone] Pager:  [phone]

## 2020-05-17 NOTE — Anesthesia Preprocedure Evaluation (Signed)
Anesthesia Evaluation  Patient identified by MRN, date of birth, ID band Patient awake    Reviewed: Allergy & Precautions, H&P , NPO status , Patient's Chart, lab work & pertinent test results  Airway Mallampati: II   Neck ROM: full    Dental   Pulmonary shortness of breath,    breath sounds clear to auscultation       Cardiovascular hypertension,  Rhythm:regular Rate:Normal     Neuro/Psych  Headaches, PSYCHIATRIC DISORDERS Anxiety  Neuromuscular disease CVA, No Residual Symptoms    GI/Hepatic GERD  ,  Endo/Other  diabetes  Renal/GU ESRF and DialysisRenal disease     Musculoskeletal  (+) Arthritis ,   Abdominal   Peds  Hematology  (+) Blood dyscrasia, anemia ,   Anesthesia Other Findings   Reproductive/Obstetrics                             Anesthesia Physical Anesthesia Plan  ASA: III  Anesthesia Plan: General   Post-op Pain Management:    Induction: Intravenous  PONV Risk Score and Plan: 3 and Ondansetron, Dexamethasone, Treatment may vary due to age or medical condition and Midazolam  Airway Management Planned: LMA  Additional Equipment:   Intra-op Plan:   Post-operative Plan: Extubation in OR  Informed Consent: I have reviewed the patients History and Physical, chart, labs and discussed the procedure including the risks, benefits and alternatives for the proposed anesthesia with the patient or authorized representative who has indicated his/her understanding and acceptance.     Dental advisory given  Plan Discussed with: CRNA, Anesthesiologist and Surgeon  Anesthesia Plan Comments:         Anesthesia Quick Evaluation

## 2020-05-17 NOTE — Transfer of Care (Signed)
Immediate Anesthesia Transfer of Care Note  Patient: Patricia [name]  Procedure(s) Performed: RIGHT UPPER EXTREMITY SECOND STAGE BASCILIC VEIN TRANSPOSITION (Right Arm Upper) LIGATION OF COMPETING BRANCHES OF RIGHT ARM ARTERIOVENOUS FISTULA (Right Arm Upper)  Patient Location: PACU  Anesthesia Type:General  Level of Consciousness: drowsy and patient cooperative  Airway & Oxygen Therapy: Patient Spontanous Breathing and Patient connected to nasal cannula oxygen  Post-op Assessment: Report given to RN and Post -op Vital signs reviewed and stable  Post vital signs: Reviewed and stable  Last Vitals:  Vitals Value Taken Time  BP 112/71 05/17/20 1405  Temp 36.7 C 05/17/20 1405  Pulse 70 05/17/20 1407  Resp 25 05/17/20 1407  SpO2 94 % 05/17/20 1407  Vitals shown include unvalidated device data.  Last Pain:  Vitals:   05/17/20 0837  PainSc: 0-No pain         Complications: No complications documented.

## 2020-05-17 NOTE — Interval H&P Note (Signed)
History and Physical Interval Note:  05/17/2020 11:10 AM  Patricia [name]  has presented today for surgery, with the diagnosis of ESRD.  The various methods of treatment have been discussed with the patient and family. After consideration of risks, benefits and other options for treatment, the patient has consented to  Procedure(s): RIGHT UPPER EXTREMITY SECOND STAGE [place] (Right) LIGATION OF COMPETING BRANCHES OF RIGHT ARM ARTERIOVENOUS FISTULA (Right) as a surgical intervention.  The patient's history has been reviewed, patient examined, no change in status, stable for surgery.  I have reviewed the patient's chart and labs.  Questions were answered to the patient's satisfaction.     [name]

## 2020-05-18 NOTE — Anesthesia Postprocedure Evaluation (Signed)
Anesthesia Post Note  Patient: Patricia [name]  Procedure(s) Performed: RIGHT UPPER EXTREMITY SECOND STAGE BASCILIC VEIN TRANSPOSITION (Right Arm Upper) LIGATION OF COMPETING BRANCHES OF RIGHT ARM ARTERIOVENOUS FISTULA (Right Arm Upper)     Patient location during evaluation: PACU Anesthesia Type: General Level of consciousness: awake and alert Pain management: pain level controlled Vital Signs Assessment: post-procedure vital signs reviewed and stable Respiratory status: spontaneous breathing, nonlabored ventilation, respiratory function stable and patient connected to nasal cannula oxygen Cardiovascular status: blood pressure returned to baseline and stable Postop Assessment: no apparent nausea or vomiting Anesthetic complications: no   No complications documented.  Last Vitals:  Vitals:   05/17/20 1420 05/17/20 1435  BP: 137/87 132/81  Pulse: 68 67  Resp: 20 20  Temp:  36.7 C  SpO2: 95% 96%    Last Pain:  Vitals:   05/17/20 1435  PainSc: [place]

## 2020-05-19 ENCOUNTER — Encounter (HOSPITAL_COMMUNITY): Payer: Self-pay | Admitting: Surgery

## 2020-05-20 ENCOUNTER — Other Ambulatory Visit: Payer: Self-pay

## 2020-05-20 DIAGNOSIS — N186 End stage renal disease: Secondary | ICD-10-CM

## 2020-05-20 DIAGNOSIS — Z992 Dependence on renal dialysis: Secondary | ICD-10-CM

## 2020-05-29 NOTE — Interval H&P Note (Signed)
History and Physical Interval Note:  05/29/2020 9:17 AM  Patricia [name]  has presented today for surgery, with the diagnosis of End Stage Renal Disease.  The various methods of treatment have been discussed with the patient and family. After consideration of risks, benefits and other options for treatment, the patient has consented to  Procedure(s): RIGHT UPPER EXTREMITY SECOND STAGE [place] (Right) LIGATION OF COMPETING BRANCHES OF RIGHT ARM ARTERIOVENOUS FISTULA (Right) as a surgical intervention.  The patient's history has been reviewed, patient examined, no change in status, stable for surgery.  I have reviewed the patient's chart and labs.  Questions were answered to the patient's satisfaction.     [name]

## 2020-05-29 NOTE — H&P (Signed)
Postoperative Access Visit   History of Present Illness   Patricia [name] is a 45 y.o. year old female who presents for postoperative follow-up for first stage right basilic vein fistula creation by Dr. Trula Slade on 03/15/20. The patient's wounds are well healed.  The patient notes no steal symptoms.  The patient is able to complete their activities of daily living.   She is currently dialyzing via left IJ TDC at the [facility] in [place] on Tues/ Thurs/ Sat  Physical Examination      Vitals:   04/29/20 1045  BP: 127/78  Pulse: 81  Resp: 20  Temp: 98.6 F (37 C)  TempSrc: Temporal  SpO2: 100%  Weight: 212 lb 4.8 oz (96.3 kg)  Height: $Remove'5\' 7"'CatKRoD$  (1.702 m)   Body mass index is 33.25 kg/m.  right arm Incision is well healed, 2+ radial pulse, hand grip is 5/5, sensation in digits is  intact, palpable thrill, bruit can be auscultated     VAS US Duplex Dialysis Access: 04/29/20 Findings:  +--------------------+----------+-----------------+--------+  AVF         PSV (cm/s)Flow Vol (mL/min)Comments  +--------------------+----------+-----------------+--------+  Native artery inflow  232     1148          +--------------------+----------+-----------------+--------+  AVF Anastomosis     664                 +--------------------+----------+-----------------+--------+   +------------+----------+-------------+----------+------------------+  OUTFLOW VEINPSV (cm/s)Diameter (cm)Depth (cm)   Describe     +------------+----------+-------------+----------+------------------+  Prox UA     115    0.52     2.49             +------------+----------+-------------+----------+------------------+  Mid UA     168    0.62     1.99   competing branch   +------------+----------+-------------+----------+------------------+  Dist UA     449    0.27      2.10  change in Diameter  +------------+----------+-------------+----------+------------------+  AC Fossa    826    0.27     1.07             +------------+----------+-------------+----------+------------------+   Summary:  Patent arteriovenous fistula with one competing branch observed. Change in vessel diameter (approximately 1.7 cm in length) involving the basilic outflow vein at the distal upper arm resulting in increased velocities.     Medical Decision Making    Patricia [name] is a 45 y.o. year old female who presents s/p first stage right basilic vein fistula creation by Dr. Trula Slade on 03/15/20. Patent is without signs or symptoms of steal syndrome. Her duplex today shows good volume flow however her fistula is both deep and has not fully matured to adequate diameter. She has some competing branches noted. I have discussed with her and her daughter that she needs a 2nd stage BV transposition and branch ligation. Risk, benefits, and alternatives to access surgery were discussed. The patient is aware the risks include but are not limited to: bleeding, infection, steal syndrome, nerve damage, thrombosis, failure to mature, and need for additional procedures. The patient agrees to proceed with the procedure. _  I also have encouraged her to continue to exercise her arm to help it mature.  - She dialyzes on Tues/ Thurs/ Sat and I have discussed that her surgery will try to be arranged around her dialysis schedule but that it may have to be changed to accommodate her surgery.  - I will schedule her 2nd stage BV transposition  and branch ligation with Dr. Trula Slade at next earliest availability  Karoline Caldwell, PA-C [facility] [place]: [facility] MD: Trula Slade

## 2020-06-10 ENCOUNTER — Ambulatory Visit (INDEPENDENT_AMBULATORY_CARE_PROVIDER_SITE_OTHER): Payer: Medicare Other | Admitting: Physician Assistant

## 2020-06-10 ENCOUNTER — Other Ambulatory Visit: Payer: Self-pay

## 2020-06-10 ENCOUNTER — Ambulatory Visit (HOSPITAL_COMMUNITY)
Admission: RE | Admit: 2020-06-10 | Discharge: 2020-06-10 | Disposition: A | Discharge status: Home / Self Care | Payer: Medicare Other | Source: Ambulatory Visit | Attending: Surgery | Admitting: Surgery

## 2020-06-10 VITALS — BP 135/82 | HR 58 | Temp 97.6°F | Resp 16 | Ht 67.0 in | Wt 220.0 lb

## 2020-06-10 DIAGNOSIS — N186 End stage renal disease: Secondary | ICD-10-CM | POA: Insufficient documentation

## 2020-06-10 DIAGNOSIS — Z992 Dependence on renal dialysis: Secondary | ICD-10-CM | POA: Insufficient documentation

## 2020-06-10 NOTE — Progress Notes (Signed)
    Postoperative Access Visit   History of Present Illness   Muriel Shawnetta Lein is a 45 y.o. year old female who presents for postoperative follow-up for: right second stage basilic vein transposition by Dr. Trula Slade (Date: 05/17/20).  The patient's wounds are healed.  The patient denies steal symptoms.  The patient is able to complete their activities of daily living.  She is currently dialyzing via left IJ [facility] on a Tuesday Thursday Saturday schedule in [place] under the care of Dr. Posey Pronto.   Physical Examination   Vitals:   06/10/20 1454  BP: 135/82  Pulse: (!) 58  Resp: 16  Temp: 97.6 F (36.4 C)  TempSrc: Temporal  SpO2: 100%  Weight: 220 lb (99.8 kg)  Height: $Remove'5\' 7"'GHWXrkg$  (1.702 m)   Body mass index is 34.46 kg/m.  right arm Incisions are healed, palpable radial pulse, hand grip is 5/5, sensation in digits is  intact, palpable thrill, bruit can be auscultated     Medical Decision Making   Shoua Taydem Cavagnaro is a 45 y.o. year old female who presents s/p right second stage basilic vein transposition   Patent R brachiobasilic fistula without signs or symptoms of steal syndrome  The patient's access will be ready for use 06/25/20  The patient's tunneled dialysis catheter can be removed when Nephrology is comfortable with the performance of the fistula  The patient may follow up on a prn basis   Dagoberto Ligas PA-C [facility] Office: [phone]  Clinic MD: Stanford Breed on call

## 2020-06-25 DIAGNOSIS — H5589 Other irregular eye movements: Secondary | ICD-10-CM | POA: Insufficient documentation

## 2020-06-25 DIAGNOSIS — R4182 Altered mental status, unspecified: Secondary | ICD-10-CM | POA: Insufficient documentation

## 2020-08-13 DIAGNOSIS — Z20822 Contact with and (suspected) exposure to covid-19: Secondary | ICD-10-CM | POA: Insufficient documentation

## 2020-09-03 DIAGNOSIS — T82898A Other specified complication of vascular prosthetic devices, implants and grafts, initial encounter: Secondary | ICD-10-CM | POA: Insufficient documentation

## 2020-11-05 DIAGNOSIS — G514 Facial myokymia: Secondary | ICD-10-CM

## 2020-11-05 HISTORY — DX: Facial myokymia: G51.4

## 2021-01-09 ENCOUNTER — Encounter: Payer: Self-pay | Admitting: Podiatry

## 2021-01-09 ENCOUNTER — Other Ambulatory Visit: Payer: Self-pay

## 2021-01-09 ENCOUNTER — Ambulatory Visit (INDEPENDENT_AMBULATORY_CARE_PROVIDER_SITE_OTHER): Payer: Medicare Other | Admitting: Podiatry

## 2021-01-09 DIAGNOSIS — G609 Hereditary and idiopathic neuropathy, unspecified: Secondary | ICD-10-CM

## 2021-01-09 DIAGNOSIS — L97512 Non-pressure chronic ulcer of other part of right foot with fat layer exposed: Secondary | ICD-10-CM | POA: Diagnosis not present

## 2021-01-09 DIAGNOSIS — I999 Unspecified disorder of circulatory system: Secondary | ICD-10-CM

## 2021-01-10 ENCOUNTER — Encounter: Payer: Self-pay | Admitting: Podiatry

## 2021-01-10 NOTE — Progress Notes (Signed)
Subjective:  Patient ID: Patricia [name], female    DOB: [birth year],  MRN: [id number]  Chief Complaint  Patient presents with   Wound Check    "I had a sore that I picked at, now it's bleeding."    46 y.o. female presents for wound care.  Patient presents with a new complaint of right dorsal 5 ulceration with fat layer exposed.  Patient stated there was a sore that is progressive gotten worse.  She has not seen a nurse for to see me.  She would like to discuss treatment options for this.  She has not been offloading she has not been keeping covered.  Is bleeding a little bit.   Review of Systems: Negative except as noted in the HPI. Denies N/V/F/Ch.  Past Medical History:  Diagnosis Date   Acid reflux    Anemia 04/2019   REQUIRING BLOOD TRANSFUSIONS   Anxiety    Arthritis    Dyspnea    with exertion   Dysrhythmia    tachycardia   ESRD (end stage renal disease) (HCC)    [facility]   GI bleeding 12/2017   Gout    Headache(784.0)    "related to chemo; sometimes weekly" (09/12/2013)   Heart murmur    Systolic per Dr Orpah Cobb' notes   High cholesterol    History of blood transfusion "a couple"   "related to low counts"   Hypertension    Iron deficiency anemia    "get epogen shots q month" (02/20/2014)   MCGN (minimal change glomerulonephritis)    "using chemo to tx;  finished my last tx in 12/2013"   Peritoneal dialysis status (HCC)    Stroke (HCC) 08/15/2017   no residual     Current Outpatient Medications:    acetaminophen (TYLENOL) 325 MG tablet, Take 2 tablets (650 mg total) by mouth every 6 (six) hours as needed for mild pain (or Fever >/= 101)., Disp: , Rfl:    allopurinol (ZYLOPRIM) 100 MG tablet, Take 100 mg by mouth at bedtime. , Disp: , Rfl:    ALPRAZolam (XANAX) 1 MG tablet, Take 1 mg by mouth 2 (two) times daily as needed for anxiety. , Disp: , Rfl:    amLODipine (NORVASC) 10 MG tablet, Take 10 mg by mouth daily., Disp: , Rfl:     atorvastatin (LIPITOR) 40 MG tablet, Take 1 tablet (40 mg total) by mouth daily at 6 PM., Disp: 30 tablet, Rfl: 3   hydrALAZINE (APRESOLINE) 25 MG tablet, Take 25 mg by mouth in the morning and at bedtime., Disp: , Rfl:    HYDROcodone-acetaminophen (NORCO/VICODIN) 5-325 MG tablet, Take 1 tablet by mouth every 4 (four) hours as needed for severe pain., Disp: 25 tablet, Rfl: 0   hydrOXYzine (ATARAX/VISTARIL) 25 MG tablet, Take 25 mg by mouth every 6 (six) hours., Disp: , Rfl:    Lanthanum Carbonate (FOSRENOL) 1000 MG PACK, Take 1 packet by mouth with breakfast, with lunch, and with evening meal., Disp: , Rfl:    lisinopril (ZESTRIL) 20 MG tablet, Take 20 mg by mouth daily., Disp: , Rfl:    LYRICA 25 MG capsule, Take 25 mg by mouth See admin instructions. Daily on Sun Mon, Wed , Fri. 2 times daily Tues Thurs and Sat, Disp: , Rfl:    metoprolol tartrate (LOPRESSOR) 100 MG tablet, Take 100 mg by mouth daily., Disp: , Rfl:    multivitamin (RENA-VIT) TABS tablet, Take 1 tablet by mouth at bedtime. , Disp: , Rfl:  omeprazole (PRILOSEC) 40 MG capsule, Take 40 mg by mouth in the morning and at bedtime., Disp: , Rfl:    promethazine (PHENERGAN) 25 MG tablet, Take 1 tablet (25 mg total) by mouth every 8 (eight) hours as needed for nausea or vomiting., Disp: 10 tablet, Rfl: 0   zolpidem (AMBIEN) 10 MG tablet, Take 10 mg by mouth at bedtime., Disp: , Rfl:    amoxicillin-clavulanate (AUGMENTIN) 500-125 MG tablet, TAKE 1 TABLET (500 MG TOTAL) BY MOUTH EVERY EVENING. (Patient not taking: Reported on 05/08/2020), Disp: 4 tablet, Rfl: 0   isosorbide dinitrate (ISORDIL) 10 MG tablet, Take 10 mg by mouth daily. (Patient not taking: Reported on 01/09/2021), Disp: , Rfl:    saccharomyces boulardii (FLORASTOR) 250 MG capsule, TAKE 1 CAPSULE (250 MG TOTAL) BY MOUTH TWO TIMES DAILY. (Patient not taking: Reported on 05/08/2020), Disp: 10 capsule, Rfl: 0  Social History   Tobacco Use  Smoking Status Never  Smokeless Tobacco  Never    Allergies  Allergen Reactions   Nsaids Other (See Comments)    Cannot take due to Kidney disease/Kidney function    Tolmetin Other (See Comments)    Cannot take due to Kidney Disease   Objective:  There were no vitals filed for this visit. There is no height or weight on file to calculate BMI. Constitutional Well developed. Well nourished.  Vascular Dorsalis pedis pulses nonpalpable bilaterally. Posterior tibial pulses nonpalpable bilaterally. Capillary refill normal diminished to all digits No cyanosis or clubbing noted. Pedal hair growth not present  Neurologic Normal speech. Oriented to person, place, and time. Protective sensation absent  Dermatologic Wound Location: Right submetatarsal 5 with fat layer exposed.  Probing down to deep tissue not down to bone.  No purulent drainage noted.  Minimal bleeding noted.  No malodor present. Wound Base: Mixed Granular/Fibrotic Peri-wound: Calloused Exudate: Scant/small amount Serosanguinous exudate Wound Measurements: -See below  Orthopedic: No pain to palpation either foot.   Radiographs: None Assessment:   1. Vascular abnormality   2. Idiopathic peripheral neuropathy   3. Right foot ulcer, with fat layer exposed ([place])    Plan:  Patient was evaluated and treated and all questions answered.  Ulcer right submetatarsal 5 ulceration with fat layer exposed -Debridement as below. -Dressed with Betadine wet-to-dry, DSD. -Continue off-loading with surgical shoe. -Patient is a high risk of partial fifth ray amputation versus foot amputation versus below the knee amputation she states understanding.  She has peripheral neuropathy.  She is not a diabetic  Vascular abnormality -Clinically I am not able to get the tissue to bleed much at all.  Given these findings I believe patient will benefit from ABIs PVRs to assess the vascular flow to the right lower extremity.  ABIs PVRs were ordered.  If there is a need for vascular  intervention I will refer her for blood flow to be optimized  Procedure: Excisional Debridement of Wound Tool: Sharp chisel blade/tissue nipper Rationale: Removal of non-viable soft tissue from the wound to promote healing.  Anesthesia: none Pre-Debridement Wound Measurements: 0.5 cm x 0.7 cm x 0.4 cm  Post-Debridement Wound Measurements: 0.7 cm x 0.9 cm x 0.4 cm  Type of Debridement: Sharp Excisional Tissue Removed: Non-viable soft tissue Blood loss: Minimal (<50cc) Depth of Debridement: subcutaneous tissue. Technique: Sharp excisional debridement to bleeding, viable wound base.  Wound Progress: This is my initial evaluation of continue to monitor the progression of the wound Site healing conversation 7 Dressing: Dry, sterile, compression dressing. Disposition: Patient tolerated procedure  well. Patient to return in 1 week for follow-up.  No follow-ups on file.

## 2021-01-13 ENCOUNTER — Encounter (HOSPITAL_COMMUNITY): Payer: Medicare Other

## 2021-01-13 ENCOUNTER — Ambulatory Visit (HOSPITAL_COMMUNITY)
Admission: RE | Admit: 2021-01-13 | Discharge: 2021-01-13 | Disposition: A | Discharge status: Home / Self Care | Payer: Medicare Other | Source: Ambulatory Visit | Attending: Podiatry | Admitting: Podiatry

## 2021-01-13 ENCOUNTER — Ambulatory Visit (INDEPENDENT_AMBULATORY_CARE_PROVIDER_SITE_OTHER): Payer: Medicare Other | Admitting: Neurology

## 2021-01-13 ENCOUNTER — Encounter: Payer: Self-pay | Admitting: Neurology

## 2021-01-13 ENCOUNTER — Encounter: Payer: Self-pay | Admitting: *Deleted

## 2021-01-13 ENCOUNTER — Other Ambulatory Visit: Payer: Self-pay

## 2021-01-13 VITALS — BP 109/74 | HR 78 | Ht 67.0 in | Wt 236.5 lb

## 2021-01-13 DIAGNOSIS — G249 Dystonia, unspecified: Secondary | ICD-10-CM | POA: Insufficient documentation

## 2021-01-13 DIAGNOSIS — G629 Polyneuropathy, unspecified: Secondary | ICD-10-CM

## 2021-01-13 DIAGNOSIS — I999 Unspecified disorder of circulatory system: Secondary | ICD-10-CM | POA: Diagnosis present

## 2021-01-13 DIAGNOSIS — G245 Blepharospasm: Secondary | ICD-10-CM | POA: Insufficient documentation

## 2021-01-13 DIAGNOSIS — G63 Polyneuropathy in diseases classified elsewhere: Secondary | ICD-10-CM | POA: Insufficient documentation

## 2021-01-13 MED ORDER — CARBIDOPA-LEVODOPA 25-100 MG PO TABS: 1.0000 | ORAL_TABLET | Freq: Three times a day (TID) | ORAL | 3 refills | Status: DC

## 2021-01-13 MED ORDER — GABAPENTIN 100 MG PO CAPS: 100.0000 mg | ORAL_CAPSULE | Freq: Three times a day (TID) | ORAL | 11 refills | Status: DC

## 2021-01-13 MED ORDER — ALPRAZOLAM 0.5 MG PO TABS: ORAL_TABLET | ORAL | 0 refills | Status: DC

## 2021-01-13 NOTE — Progress Notes (Signed)
Chief Complaint  Patient presents with   New Patient (Initial Visit)    Rm 12. Accompanied by daughter. NP/Paper/Ashley Michaels/facial tics Pt c/o "eye rolling", mouth twitching, and neuropathy.      ASSESSMENT AND PLAN  Patricia [name] is a 46 y.o. female   Gradual onset abnormal facial movement  In the setting of end-stage renal disease, on dialysis, could indicate bilateral basal ganglion structural versus mineral deposit abnormalities  MRI of brain  Laboratory evaluation including zinc, copper level  She will likely benefit most from EMG guided xeomin injection  Give her a trial of low-dose Sinemet 25/100 mg up to 3 times a day   DIAGNOSTIC DATA (LABS, IMAGING, TESTING) - I reviewed patient records, labs, notes, testing and imaging myself where available.   MEDICAL HISTORY:  Patricia [name] is a 46 year old female, seen in request by her PA [name] for evaluation of eye facial twitching and eye rolling movement, she is accompanied by her daughter at today's visit on January 13, 2021  I reviewed and summarized the referring note. PMHx. 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 since worsening renal disease, went on dialysis, since 2018, she also was put on Trintellix for depression since November 2022, it does help her depression, but she ran out of the supply over the past few weeks, there was no significant change in her abnormal facial movement  Her abnormal facial movements started around November 2022, initially she has frequent eye rolling back movement, herself did not aware of it, was noticed by her daughter, later she also developed squinting facial movement, frequent blinking, no passing out,  She denies significant gait abnormality,   PHYSICAL EXAM:   Vitals:   01/13/21 1052  BP: 109/74  Pulse: 78  Weight: 236 lb 8 oz (107.3 kg)  Height: $Remove'5\' 7"'ulKwmNP$  (1.702 m)   Not  recorded     Body mass index is 37.04 kg/m.  PHYSICAL EXAMNIATION:  Gen: NAD, conversant, well nourised, well groomed                     Cardiovascular: Regular rate rhythm, no peripheral edema, warm, nontender. Eyes: Conjunctivae clear without exudates or hemorrhage Neck: Supple, no carotid bruits. Pulmonary: Clear to auscultation bilaterally   NEUROLOGICAL EXAM:  MENTAL STATUS: She has almost constant eye blinking, squinting movement, eye rolling backwards, Speech:    Speech is normal; fluent and spontaneous with normal comprehension.  Cognition:     Orientation to time, place and person     Normal recent and remote memory     Normal Attention span and concentration     Normal Language, naming, repeating,spontaneous speech     Fund of knowledge   CRANIAL NERVES: CN II: Visual fields are full to confrontation. Pupils are round equal and briskly reactive to light. CN III, IV, VI: extraocular movement are normal. No ptosis. CN V: Facial sensation is intact to light touch CN VII: Face is symmetric with normal eye closure  CN VIII: Hearing is normal to causal conversation. CN IX, X: Phonation is normal. CN XI: Head turning and shoulder shrug are intact  MOTOR: Normal motor strength, left chest dialysis access, no significant rigidity or bradykinesia  REFLEXES: Reflexes are 2+ and symmetric at the biceps, triceps, knees, and ankles. Plantar responses are flexor.  SENSORY: Intact to light touch, pinprick and vibratory sensation are intact in  fingers and toes.  COORDINATION: There is no trunk or limb dysmetria noted.  GAIT/STANCE: Posture is normal. Gait is steady with normal steps, base, arm swing, and turning. Heel and toe walking are normal. Tandem gait is normal.  Romberg is absent.  REVIEW OF SYSTEMS:  Full 14 system review of systems performed and notable only for as above All other review of systems were negative.   ALLERGIES: Allergies  Allergen Reactions    Nsaids Other (See Comments)    Cannot take due to Kidney disease/Kidney function    Tolmetin Other (See Comments)    Cannot take due to Kidney Disease    HOME MEDICATIONS: Current Outpatient Medications  Medication Sig Dispense Refill   acetaminophen (TYLENOL) 325 MG tablet Take 2 tablets (650 mg total) by mouth every 6 (six) hours as needed for mild pain (or Fever >/= 101).     allopurinol (ZYLOPRIM) 100 MG tablet Take 100 mg by mouth at bedtime.      ALPRAZolam (XANAX) 1 MG tablet Take 1 mg by mouth 2 (two) times daily as needed for anxiety.      atorvastatin (LIPITOR) 40 MG tablet Take 1 tablet (40 mg total) by mouth daily at 6 PM. 30 tablet 3   hydrOXYzine (ATARAX/VISTARIL) 25 MG tablet Take 25 mg by mouth every 6 (six) hours.     LYRICA 25 MG capsule Take 25 mg by mouth See admin instructions. Daily on Sun Mon, Wed , Fri. 2 times daily Tues Thurs and Sat     metoprolol tartrate (LOPRESSOR) 50 MG tablet Take 100 mg by mouth daily.     multivitamin (RENA-VIT) TABS tablet Take 1 tablet by mouth at bedtime.      omeprazole (PRILOSEC) 40 MG capsule Take 40 mg by mouth in the morning and at bedtime.     promethazine (PHENERGAN) 25 MG tablet Take 1 tablet (25 mg total) by mouth every 8 (eight) hours as needed for nausea or vomiting. 10 tablet 0   zolpidem (AMBIEN) 10 MG tablet Take 10 mg by mouth at bedtime.     No current facility-administered medications for this visit.    PAST MEDICAL HISTORY: Past Medical History:  Diagnosis Date   Acid reflux    Anemia 04/2019   REQUIRING BLOOD TRANSFUSIONS   Anxiety    Arthritis    Dyspnea    with exertion   Dysrhythmia    tachycardia   ESRD (end stage renal disease) ([place])    [facility]   GI bleeding 12/2017   Gout    Headache(784.0)    "related to chemo; sometimes weekly" (09/12/2013)   Heart murmur    Systolic per Dr Dixie Dials' notes   High cholesterol    History of blood transfusion "a couple"   "related to low counts"    Hypertension    Iron deficiency anemia    "get epogen shots q month" (02/20/2014)   MCGN (minimal change glomerulonephritis)    "using chemo to tx;  finished my last tx in 12/2013"   Peritoneal dialysis status ([place])    Stroke ([place]) 08/15/2017   no residual     PAST SURGICAL HISTORY: Past Surgical History:  Procedure Laterality Date   ABDOMINAL HYSTERECTOMY  2010   "laparoscopic"   ANKLE FRACTURE SURGERY Right 1994   AV FISTULA PLACEMENT Left 04/14/2016   Procedure: ARTERIOVENOUS (AV) FISTULA CREATION;  Surgeon: Angelia Mould, MD;  Location: [place];  Service: Vascular;  Laterality: Left;   AV FISTULA  PLACEMENT Left 08/09/2017   Procedure: INSERTION OF ARTERIOVENOUS (AV) GORE-TEX GRAFT LEFT ARM;  Surgeon: Elam Dutch, MD;  Location: [facility] OR;  Service: Vascular;  Laterality: Left;   AV FISTULA PLACEMENT Right 03/15/2020   Procedure: RIGHT ARM ARTERIOVENOUS (AV) FISTULA CREATION 1st Stage Basilic Transposition;  Surgeon: Serafina Mitchell, MD;  Location[facility] OR;  Service: Vascular;  Laterality: Right;   [place] REMOVAL Left 08/11/2017   Procedure: REMOVAL OF ARTERIOVENOUS GORETEX GRAFT ([place]);  Surgeon: Serafina Mitchell, MD;  [facility];  Service: Vascular;  Laterality: Left;   [place] Right 05/17/2020   Procedure: RIGHT UPPER EXTREMITY SECOND STAGE [place];  Surgeon: Serafina Mitchell, MD;  Location: [place];  Service: Vascular;  Laterality: Right;   BIOPSY  09/03/2017   Procedure: BIOPSY;  Surgeon: Otis Brace, MD;  Location: [place] [facility];  Service: Gastroenterology;;   BIOPSY  12/24/2017   Procedure: BIOPSY;  Surgeon: Irving Copas., MD;  Location: [place];  Service: Gastroenterology;;   CARDIAC CATHETERIZATION  2000's   COLONOSCOPY WITH PROPOFOL N/A 09/04/2017   Procedure: COLONOSCOPY WITH PROPOFOL;  Surgeon: Ronnette Juniper, MD;  Location: [place];  Service: Gastroenterology;  Laterality: N/A;   ENTEROSCOPY N/A 12/24/2017    Procedure: ENTEROSCOPY;  Surgeon: Rush Landmark Telford Nab., MD;  Location: [place];  Service: Gastroenterology;  Laterality: N/A;   ESOPHAGOGASTRODUODENOSCOPY     ESOPHAGOGASTRODUODENOSCOPY (EGD) WITH PROPOFOL Left 06/04/2017   Procedure: ESOPHAGOGASTRODUODENOSCOPY (EGD) WITH PROPOFOL;  Surgeon: Arta Silence, MD;  Location: [place];  Service: Endoscopy;  Laterality: Left;   ESOPHAGOGASTRODUODENOSCOPY (EGD) WITH PROPOFOL N/A 09/03/2017   Procedure: ESOPHAGOGASTRODUODENOSCOPY (EGD) WITH PROPOFOL;  Surgeon: Otis Brace, MD;  Location: [place];  Service: Gastroenterology;  Laterality: N/A;   FISTULA SUPERFICIALIZATION Left 04/02/2017   Procedure: FISTULA SUPERFICIALIZATION LEFT ARM;  Surgeon: Serafina Mitchell, MD;  Location: [place];  Service: Vascular;  Laterality: Left;   FISTULOGRAM Left 04/07/2016   Procedure: FISTULOGRAM;  Surgeon: Waynetta Sandy, MD;  Location: [place];  Service: Vascular;  Laterality: Left;   FRACTURE SURGERY     right ankle   GIVENS CAPSULE STUDY N/A 10/29/2017   Procedure: GIVENS CAPSULE STUDY;  Surgeon: Wilford Corner, MD;  Location: [place];  Service: Endoscopy;  Laterality: N/A;   GIVENS CAPSULE STUDY N/A 12/24/2017   Procedure: GIVENS CAPSULE STUDY;  Surgeon: Irving Copas., MD;  Location: [place];  Service: Gastroenterology;  Laterality: N/A;   GIVENS CAPSULE STUDY N/A 05/22/2018   Procedure: GIVENS CAPSULE STUDY;  Surgeon: Irving Copas., MD;  Location: [place];  Service: Gastroenterology;  Laterality: N/A;   INSERTION OF DIALYSIS CATHETER Right 04/07/2016   Procedure: INSERTION OF DIALYSIS CATHETER;  Surgeon: Waynetta Sandy, MD;  Location: [place];  Service: Vascular;  Laterality: Right;   INSERTION OF DIALYSIS CATHETER Right 04/04/2017   Procedure: INSERTION OF DIALYSIS CATHETER;  Surgeon: Waynetta Sandy, MD;  Location: [place];  Service: Vascular;  Laterality: Right;   IR FLUORO GUIDE CV  LINE LEFT  03/13/2020   IR US GUIDE VASC ACCESS LEFT  03/13/2020   LEFT HEART CATH AND CORONARY ANGIOGRAPHY N/A 05/12/2019   Procedure: LEFT HEART CATH AND CORONARY ANGIOGRAPHY;  Surgeon: Dixie Dials, MD;  Location: [place] CV LAB;  Service: Cardiovascular;  Laterality: N/A;   LIGATION OF ARTERIOVENOUS  FISTULA Left 04/14/2016   Procedure: LIGATION OF ARTERIOVENOUS  FISTULA;  Surgeon: Angelia Mould, MD;  Location: [place];  Service: Vascular;  Laterality:  Left;   LIGATION OF COMPETING BRANCHES OF ARTERIOVENOUS FISTULA Right 05/17/2020   Procedure: LIGATION OF COMPETING BRANCHES OF RIGHT ARM ARTERIOVENOUS FISTULA;  Surgeon: Serafina Mitchell, MD;  [facility];  Service: Vascular;  Laterality: Right;   PATCH ANGIOPLASTY Left 06/19/2016   Procedure: PATCH ANGIOPLASTY LEFT ARTERIOVENOUS FISTULA;  Surgeon: Angelia Mould, MD;  Location: [place];  Service: Vascular;  Laterality: Left;   POLYPECTOMY  09/04/2017   Procedure: POLYPECTOMY;  Surgeon: Ronnette Juniper, MD;  Location[facility]COPY;  Service: Gastroenterology;;   REVISON OF ARTERIOVENOUS FISTULA Left 06/19/2016   Procedure: REVISION SUPERFICIALIZATION OF BRACHIOCEPHALIC ARTERIOVENOUS FISTULA;  Surgeon: Angelia Mould, MD;  Location: [facility] OR;  Service: Vascular;  Laterality: Left;    FAMILY HISTORY: Family History  Problem Relation Age of Onset   Hypertension Mother    Thyroid disease Mother    Coronary artery disease Father    Hypertension Father    Diabetes Father    Colon cancer Maternal Aunt    Esophageal cancer Maternal Uncle    Inflammatory bowel disease Neg Hx    Liver disease Neg Hx    Pancreatic cancer Neg Hx    Rectal cancer Neg Hx    Stomach cancer Neg Hx     SOCIAL HISTORY: Social History   Socioeconomic History   Marital status: Single    Spouse name: Not on file   Number of children: 3   Years of education: 14   Highest education level: Associate degree: academic program  Occupational History    Occupation: disabled  Tobacco Use   Smoking status: Never   Smokeless tobacco: Never  Vaping Use   Vaping Use: Never used  Substance and Sexual Activity   Alcohol use: No   Drug use: No   Sexual activity: Not Currently    Birth control/protection: Surgical  Other Topics Concern   Not on file  Social History Narrative   Not on file   Social Determinants of Health   Financial Resource Strain: Not on file  Food Insecurity: Not on file  Transportation Needs: Not on file  Physical Activity: Not on file  Stress: Not on file  Social Connections: Not on file  Intimate Partner Violence: Not on file      Marcial Pacas, M.D. Ph.D.  [facility] [address] [place], [place] [address] Ph: [phone] Fax: [phone]  CC:  Barrie Lyme [address] [facility] [place],   [address]  Doyle Askew, PA-C

## 2021-01-16 LAB — COMPREHENSIVE METABOLIC PANEL
ALT: 18 IU/L (ref 0–32)
AST: 18 IU/L (ref 0–40)
Albumin/Globulin Ratio: 1.6 (ref 1.2–2.2)
Albumin: 4.6 g/dL (ref 3.8–4.8)
Alkaline Phosphatase: 120 IU/L (ref 44–121)
BUN/Creatinine Ratio: 6 — ABNORMAL LOW (ref 9–23)
BUN: 61 mg/dL — ABNORMAL HIGH (ref 6–24)
Bilirubin Total: <0.2 mg/dL (ref 0.0–1.2)
CO2: 22 mmol/L (ref 20–29)
Calcium: 9.2 mg/dL (ref 8.7–10.2)
Chloride: 97 mmol/L (ref 96–106)
Creatinine, Ser: 10.59 mg/dL — ABNORMAL HIGH (ref 0.57–1.00)
Globulin, Total: 2.9 g/dL (ref 1.5–4.5)
Glucose: 108 mg/dL — ABNORMAL HIGH (ref 70–99)
Potassium: 5.6 mmol/L — ABNORMAL HIGH (ref 3.5–5.2)
Sodium: 140 mmol/L (ref 134–144)
Total Protein: 7.5 g/dL (ref 6.0–8.5)
eGFR: 4 mL/min/{1.73_m2} — ABNORMAL LOW (ref >59)

## 2021-01-16 LAB — CERULOPLASMIN: Ceruloplasmin: 23.4 mg/dL (ref 19.0–39.0)

## 2021-01-16 LAB — FOLATE: Folate: >20 ng/mL (ref >3.0)

## 2021-01-16 LAB — IRON AND TIBC
Iron Saturation: 68 % — ABNORMAL HIGH (ref 15–55)
Iron: 176 ug/dL — ABNORMAL HIGH (ref 27–159)
Total Iron Binding Capacity: 258 ug/dL (ref 250–450)
UIBC: 82 ug/dL — ABNORMAL LOW (ref 131–425)

## 2021-01-16 LAB — HGB A1C W/O EAG: Hgb A1c MFr Bld: 5.8 % — ABNORMAL HIGH (ref 4.8–5.6)

## 2021-01-16 LAB — ANA W/REFLEX IF POSITIVE: Anti Nuclear Antibody (ANA): NEGATIVE

## 2021-01-16 LAB — VITAMIN B12: Vitamin B-12: >2000 pg/mL — ABNORMAL HIGH (ref 232–1245)

## 2021-01-16 LAB — TSH: TSH: 1.08 u[IU]/mL (ref 0.450–4.500)

## 2021-01-16 LAB — SEDIMENTATION RATE: Sed Rate: 49 mm/hr — ABNORMAL HIGH (ref 0–32)

## 2021-01-16 LAB — ZINC: Zinc: 80 ug/dL (ref 44–115)

## 2021-01-16 LAB — CK: Total CK: 74 U/L (ref 32–182)

## 2021-01-16 LAB — COPPER, SERUM: Copper: 118 ug/dL (ref 80–158)

## 2021-01-16 LAB — FERRITIN: Ferritin: 2832 ng/mL — ABNORMAL HIGH (ref 15–150)

## 2021-01-16 LAB — C-REACTIVE PROTEIN: CRP: 12 mg/L — ABNORMAL HIGH (ref 0–10)

## 2021-01-17 ENCOUNTER — Other Ambulatory Visit: Payer: Self-pay

## 2021-01-17 DIAGNOSIS — N186 End stage renal disease: Secondary | ICD-10-CM

## 2021-01-20 ENCOUNTER — Telehealth: Payer: Self-pay | Admitting: Neurology

## 2021-01-20 MED ORDER — XEOMIN 50 UNITS IM SOLR: 50.0000 [IU] | Freq: Once | INTRAMUSCULAR | 0 refills | Status: AC

## 2021-01-20 NOTE — Telephone Encounter (Signed)
I called patient and LVM regarding scheduling Xeomin injection.

## 2021-01-20 NOTE — Addendum Note (Signed)
Addended by: Noberto Retort C on: 01/20/2021 09:45 AM   Modules accepted: Orders

## 2021-01-20 NOTE — Telephone Encounter (Signed)
One vial of 50 units ordered for first treatment. Need to evaluate dosage for subsequent injections.

## 2021-01-20 NOTE — Telephone Encounter (Signed)
Received charge sheet and signed patient consent (given to medical records) requesting 100 units of Xeomin (A8902) *use 50 units at first injection per Dr. Krista Blue, for G24.5. (CPT [phone], [id number]).   Patient currently has [facility] Medicare. Obtained PA for Xeomin via [facility] portal. PA #[id number] (01/17/21- 01/17/22). Patient will need to use [place].

## 2021-01-24 ENCOUNTER — Other Ambulatory Visit: Payer: Self-pay

## 2021-01-24 ENCOUNTER — Ambulatory Visit
Admission: RE | Admit: 2021-01-24 | Discharge: 2021-01-24 | Disposition: A | Discharge status: Home / Self Care | Payer: Medicare Other | Source: Ambulatory Visit | Attending: Neurology | Admitting: Neurology

## 2021-01-24 DIAGNOSIS — G249 Dystonia, unspecified: Secondary | ICD-10-CM

## 2021-01-24 DIAGNOSIS — G629 Polyneuropathy, unspecified: Secondary | ICD-10-CM

## 2021-01-29 ENCOUNTER — Other Ambulatory Visit: Payer: Self-pay

## 2021-01-29 ENCOUNTER — Encounter: Payer: Self-pay | Admitting: Podiatry

## 2021-01-29 ENCOUNTER — Ambulatory Visit (INDEPENDENT_AMBULATORY_CARE_PROVIDER_SITE_OTHER): Payer: Medicare Other | Admitting: Podiatry

## 2021-01-29 DIAGNOSIS — I999 Unspecified disorder of circulatory system: Secondary | ICD-10-CM | POA: Diagnosis not present

## 2021-01-29 DIAGNOSIS — L97512 Non-pressure chronic ulcer of other part of right foot with fat layer exposed: Secondary | ICD-10-CM

## 2021-01-29 NOTE — Progress Notes (Signed)
Subjective:  Patient ID: Patricia [name], female    DOB: 1975/09/17,  MRN: [id number]  Chief Complaint  Patient presents with   Callouses    46 y.o. female presents for wound care.  Patient presents follow-up with a right fifth metatarsal ulceration.  She states that she has been doing Betadine wet-to-dry dressing has been doing well.  She is ambulating with surgical shoe.  She is here to go over the vascular study results as well as discuss other options.  She is not a diabetic   Review of Systems: Negative except as noted in the HPI. Denies N/V/F/Ch.  Past Medical History:  Diagnosis Date   Acid reflux    Anemia 04/2019   REQUIRING BLOOD TRANSFUSIONS   Anxiety    Arthritis    Dyspnea    with exertion   Dysrhythmia    tachycardia   ESRD (end stage renal disease) ([place])    [facility]   GI bleeding 12/2017   Gout    Headache(784.0)    "related to chemo; sometimes weekly" (09/12/2013)   Heart murmur    Systolic per Dr Dixie Dials' notes   High cholesterol    History of blood transfusion "a couple"   "related to low counts"   Hypertension    Iron deficiency anemia    "get epogen shots q month" (02/20/2014)   MCGN (minimal change glomerulonephritis)    "using chemo to tx;  finished my last tx in 12/2013"   Peritoneal dialysis status ([place])    Stroke ([place]) 08/15/2017   no residual     Current Outpatient Medications:    acetaminophen (TYLENOL) 325 MG tablet, Take 2 tablets (650 mg total) by mouth every 6 (six) hours as needed for mild pain (or Fever >/= 101)., Disp: , Rfl:    allopurinol (ZYLOPRIM) 100 MG tablet, Take 100 mg by mouth at bedtime. , Disp: , Rfl:    ALPRAZolam (XANAX) 0.5 MG tablet, Take 1-2 tablets 30 minutes prior to MRI, may repeat once as needed. Must have driver., Disp: 3 tablet, Rfl: 0   ALPRAZolam (XANAX) 1 MG tablet, Take 1 mg by mouth 2 (two) times daily as needed for anxiety. , Disp: , Rfl:    atorvastatin (LIPITOR) 40 MG tablet, Take 1  tablet (40 mg total) by mouth daily at 6 PM., Disp: 30 tablet, Rfl: 3   carbidopa-levodopa (SINEMET IR) 25-100 MG tablet, Take 1 tablet by mouth 3 (three) times daily., Disp: 90 tablet, Rfl: 3   gabapentin (NEURONTIN) 100 MG capsule, Take 1 capsule (100 mg total) by mouth 3 (three) times daily., Disp: 90 capsule, Rfl: 11   hydrOXYzine (ATARAX/VISTARIL) 25 MG tablet, Take 25 mg by mouth every 6 (six) hours., Disp: , Rfl:    metoprolol tartrate (LOPRESSOR) 50 MG tablet, Take 100 mg by mouth daily., Disp: , Rfl:    multivitamin (RENA-VIT) TABS tablet, Take 1 tablet by mouth at bedtime. , Disp: , Rfl:    omeprazole (PRILOSEC) 40 MG capsule, Take 40 mg by mouth in the morning and at bedtime., Disp: , Rfl:    promethazine (PHENERGAN) 25 MG tablet, Take 1 tablet (25 mg total) by mouth every 8 (eight) hours as needed for nausea or vomiting., Disp: 10 tablet, Rfl: 0   zolpidem (AMBIEN) 10 MG tablet, Take 10 mg by mouth at bedtime., Disp: , Rfl:   Social History   Tobacco Use  Smoking Status Never  Smokeless Tobacco Never    Allergies  Allergen Reactions  Nsaids Other (See Comments)    Cannot take due to Kidney disease/Kidney function    Tolmetin Other (See Comments)    Cannot take due to Kidney Disease   Objective:  There were no vitals filed for this visit. There is no height or weight on file to calculate BMI. Constitutional Well developed. Well nourished.  Vascular Dorsalis pedis pulses nonpalpable bilaterally. Posterior tibial pulses nonpalpable bilaterally. Capillary refill normal diminished to all digits No cyanosis or clubbing noted. Pedal hair growth not present  Neurologic Normal speech. Oriented to person, place, and time. Protective sensation absent  Dermatologic Wound Location: Right submetatarsal 5 with fat layer exposed.  Probing down to [name] tissue not down to bone.  No purulent drainage noted.  Minimal bleeding noted.  No malodor present. Wound Base: Mixed  Granular/Fibrotic Peri-wound: Calloused Exudate: Scant/small amount Serosanguinous exudate Wound Measurements: -See below  Orthopedic: No pain to palpation either foot.   Radiographs: None Assessment:   1. Vascular abnormality   2. Right foot ulcer, with fat layer exposed ([place])     Plan:  Patient was evaluated and treated and all questions answered.  Ulcer right submetatarsal 5 ulceration with fat layer exposed -Debridement as below. -Dressed with Betadine wet-to-dry, DSD. -Continue off-loading with surgical shoe. -Patient is a high risk of partial fifth ray amputation versus foot amputation versus below the knee amputation she states understanding.  She has peripheral neuropathy.  She is not a diabetic -I will discuss with our next time about a possibility of undergoing floating osteotomy to take the pressure off of the fifth metatarsal head and therefore allowing the wound to heal.  Given the nature of the wound if the bone gets infected it will need to be amputated out as this will give her the best outcome without amputation.  Vascular abnormality -ABIs PVRs were reviewed which shows within normal limits.  However she may still be having some microvessel disease  Procedure: Excisional Debridement of Wound~stagnant Tool: Sharp chisel blade/tissue nipper Rationale: Removal of non-viable soft tissue from the wound to promote healing.  Anesthesia: none Pre-Debridement Wound Measurements: 0.5 cm x 0.7 cm x 0.4 cm  Post-Debridement Wound Measurements: 0.7 cm x 0.9 cm x 0.4 cm  Type of Debridement: Sharp Excisional Tissue Removed: Non-viable soft tissue Blood loss: Minimal (<50cc) Depth of Debridement: subcutaneous tissue. Technique: Sharp excisional debridement to bleeding, viable wound base.  Wound Progress: The wound is appears to be about the same measurement. Dressing: Dry, sterile, compression dressing. Disposition: Patient tolerated procedure well. Patient to return in 1  week for follow-up.  No follow-ups on file.

## 2021-02-03 ENCOUNTER — Ambulatory Visit: Payer: Medicare Other | Admitting: Surgery

## 2021-02-03 ENCOUNTER — Encounter (HOSPITAL_COMMUNITY): Payer: Medicare Other

## 2021-02-03 ENCOUNTER — Other Ambulatory Visit (HOSPITAL_COMMUNITY): Payer: Medicare Other

## 2021-02-07 ENCOUNTER — Ambulatory Visit: Payer: Medicare Other | Admitting: Vascular Surgery

## 2021-02-07 ENCOUNTER — Inpatient Hospital Stay (HOSPITAL_COMMUNITY): Admission: RE | Admit: 2021-02-07 | Payer: Medicare Other | Source: Ambulatory Visit

## 2021-02-07 ENCOUNTER — Other Ambulatory Visit (HOSPITAL_COMMUNITY): Payer: Medicare Other

## 2021-02-14 ENCOUNTER — Other Ambulatory Visit: Payer: Self-pay

## 2021-02-14 ENCOUNTER — Encounter: Payer: Self-pay | Admitting: Vascular Surgery

## 2021-02-14 ENCOUNTER — Ambulatory Visit (INDEPENDENT_AMBULATORY_CARE_PROVIDER_SITE_OTHER): Payer: Medicare Other | Admitting: Vascular Surgery

## 2021-02-14 ENCOUNTER — Ambulatory Visit (INDEPENDENT_AMBULATORY_CARE_PROVIDER_SITE_OTHER)
Admission: RE | Admit: 2021-02-14 | Discharge: 2021-02-14 | Disposition: A | Discharge status: Home / Self Care | Payer: Medicare Other | Source: Ambulatory Visit | Attending: Surgery | Admitting: Surgery

## 2021-02-14 ENCOUNTER — Ambulatory Visit (HOSPITAL_COMMUNITY)
Admission: RE | Admit: 2021-02-14 | Discharge: 2021-02-14 | Disposition: A | Discharge status: Home / Self Care | Payer: Medicare Other | Source: Ambulatory Visit | Attending: Vascular Surgery | Admitting: Vascular Surgery

## 2021-02-14 VITALS — BP 116/79 | HR 72 | Temp 98.0°F | Resp 16 | Ht 67.0 in | Wt 235.0 lb

## 2021-02-14 DIAGNOSIS — Z992 Dependence on renal dialysis: Secondary | ICD-10-CM | POA: Diagnosis present

## 2021-02-14 DIAGNOSIS — N186 End stage renal disease: Secondary | ICD-10-CM | POA: Insufficient documentation

## 2021-02-14 NOTE — Progress Notes (Addendum)
Office Note     CC:  ESRD Requesting Provider:  Doyle Askew, PA-C  HPI: Patricia [name] is a Right handed 46 y.o. ([birth year]) female with kidney disease who presents at the request of Doyle Askew, PA-C for permanent HD access. The patient has had multiple prior access procedures including left-sided radiocephalic at [facility], left-sided brachiocephalic 1443, left-sided AV graft 2019, ligated for steal syndrome, right-sided brachiobasilic. Per pt, previous tunneled lines have been placed in bilateral internal jugular veins. Current access is left-sided IJ TDC.  Patient was has also tried peritoneal dialysis, however suffered an infection and therefore removed the catheter Dialysis days are Tuesday Thursday Saturday.   On exam, Patricia [name] was doing well.  She had no complaints.  She was aware that she would need new vascular access.  She had no questions regarding dialysis, other than if she had usable vein available for fistula creation  The pt is on a statin for cholesterol management.  The pt is not on a daily aspirin.   Other AC:   The pt is  on medications for hypertension.   The pt is not diabetic. Tobacco hx:  never  Past Medical History:  Diagnosis Date   Acid reflux    Anemia 04/2019   REQUIRING BLOOD TRANSFUSIONS   Anxiety    Arthritis    Dyspnea    with exertion   Dysrhythmia    tachycardia   ESRD (end stage renal disease) ([place])    [facility]   GI bleeding 12/2017   Gout    Headache(784.0)    "related to chemo; sometimes weekly" (09/12/2013)   Heart murmur    Systolic per Dr Dixie Dials' notes   High cholesterol    History of blood transfusion "a couple"   "related to low counts"   Hypertension    Iron deficiency anemia    "get epogen shots q month" (02/20/2014)   MCGN (minimal change glomerulonephritis)    "using chemo to tx;  finished my last tx in 12/2013"   Peritoneal dialysis status ([place])    Stroke ([place]) 08/15/2017   no residual      Past Surgical History:  Procedure Laterality Date   ABDOMINAL HYSTERECTOMY  2010   "laparoscopic"   ANKLE FRACTURE SURGERY Right 1994   AV FISTULA PLACEMENT Left 04/14/2016   Procedure: ARTERIOVENOUS (AV) FISTULA CREATION;  Surgeon: Angelia Mould, MD;  Location: [place];  Service: Vascular;  Laterality: Left;   AV FISTULA PLACEMENT Left 08/09/2017   Procedure: INSERTION OF ARTERIOVENOUS (AV) GORE-TEX GRAFT LEFT ARM;  Surgeon: Elam Dutch, MD;  Location: [place];  Service: Vascular;  Laterality: Left;   AV FISTULA PLACEMENT Right 03/15/2020   Procedure: RIGHT ARM ARTERIOVENOUS (AV) FISTULA CREATION 1st Stage Basilic Transposition;  Surgeon: Serafina Mitchell, MD;  Location: [place];  Service: Vascular;  Laterality: Right;   [place] REMOVAL Left 08/11/2017   Procedure: REMOVAL OF ARTERIOVENOUS GORETEX GRAFT ([place]);  Surgeon: Serafina Mitchell, MD;  Location: [facility] OR;  Service: Vascular;  Laterality: Left;   [place] Right 05/17/2020   Procedure: RIGHT UPPER EXTREMITY SECOND STAGE [place];  Surgeon: Serafina Mitchell, MD;  Location: [place];  Service: Vascular;  Laterality: Right;   BIOPSY  09/03/2017   Procedure: BIOPSY;  Surgeon: Otis Brace, MD;  Location: [place];  Service: Gastroenterology;;   BIOPSY  12/24/2017   Procedure: BIOPSY;  Surgeon: Irving Copas., MD;  Location: [place];  Service: Gastroenterology;;  CARDIAC CATHETERIZATION  2000's   COLONOSCOPY WITH PROPOFOL N/A 09/04/2017   Procedure: COLONOSCOPY WITH PROPOFOL;  Surgeon: Ronnette Juniper, MD;  Location: [place];  Service: Gastroenterology;  Laterality: N/A;   ENTEROSCOPY N/A 12/24/2017   Procedure: ENTEROSCOPY;  Surgeon: Rush Landmark Telford Nab., MD;  Location: [place];  Service: Gastroenterology;  Laterality: N/A;   ESOPHAGOGASTRODUODENOSCOPY     ESOPHAGOGASTRODUODENOSCOPY (EGD) WITH PROPOFOL Left 06/04/2017   Procedure: ESOPHAGOGASTRODUODENOSCOPY (EGD) WITH  PROPOFOL;  Surgeon: Arta Silence, MD;  Location: [place];  Service: Endoscopy;  Laterality: Left;   ESOPHAGOGASTRODUODENOSCOPY (EGD) WITH PROPOFOL N/A 09/03/2017   Procedure: ESOPHAGOGASTRODUODENOSCOPY (EGD) WITH PROPOFOL;  Surgeon: Otis Brace, MD;  Location: [place];  Service: Gastroenterology;  Laterality: N/A;   FISTULA SUPERFICIALIZATION Left 04/02/2017   Procedure: FISTULA SUPERFICIALIZATION LEFT ARM;  Surgeon: Serafina Mitchell, MD;  Location: [place];  Service: Vascular;  Laterality: Left;   FISTULOGRAM Left 04/07/2016   Procedure: FISTULOGRAM;  Surgeon: Waynetta Sandy, MD;  Location: [place];  Service: Vascular;  Laterality: Left;   FRACTURE SURGERY     right ankle   GIVENS CAPSULE STUDY N/A 10/29/2017   Procedure: GIVENS CAPSULE STUDY;  Surgeon: Wilford Corner, MD;  Location: [place];  Service: Endoscopy;  Laterality: N/A;   GIVENS CAPSULE STUDY N/A 12/24/2017   Procedure: GIVENS CAPSULE STUDY;  Surgeon: Irving Copas., MD;  Location: [place];  Service: Gastroenterology;  Laterality: N/A;   GIVENS CAPSULE STUDY N/A 05/22/2018   Procedure: GIVENS CAPSULE STUDY;  Surgeon: Irving Copas., MD;  Location: [place];  Service: Gastroenterology;  Laterality: N/A;   INSERTION OF DIALYSIS CATHETER Right 04/07/2016   Procedure: INSERTION OF DIALYSIS CATHETER;  Surgeon: Waynetta Sandy, MD;  Location: [place];  Service: Vascular;  Laterality: Right;   INSERTION OF DIALYSIS CATHETER Right 04/04/2017   Procedure: INSERTION OF DIALYSIS CATHETER;  Surgeon: Waynetta Sandy, MD;  Location: [place];  Service: Vascular;  Laterality: Right;   IR FLUORO GUIDE CV LINE LEFT  03/13/2020   IR US GUIDE VASC ACCESS LEFT  03/13/2020   LEFT HEART CATH AND CORONARY ANGIOGRAPHY N/A 05/12/2019   Procedure: LEFT HEART CATH AND CORONARY ANGIOGRAPHY;  Surgeon: Dixie Dials, MD;  Location: [place] CV LAB;  Service: Cardiovascular;  Laterality: N/A;    LIGATION OF ARTERIOVENOUS  FISTULA Left 04/14/2016   Procedure: LIGATION OF ARTERIOVENOUS  FISTULA;  Surgeon: Angelia Mould, MD;  Location: [place];  Service: Vascular;  Laterality: Left;   LIGATION OF COMPETING BRANCHES OF ARTERIOVENOUS FISTULA Right 05/17/2020   Procedure: LIGATION OF COMPETING BRANCHES OF RIGHT ARM ARTERIOVENOUS FISTULA;  Surgeon: Serafina Mitchell, MD;  Location: [place];  Service: Vascular;  Laterality: Right;   PATCH ANGIOPLASTY Left 06/19/2016   Procedure: PATCH ANGIOPLASTY LEFT ARTERIOVENOUS FISTULA;  Surgeon: Angelia Mould, MD;  Location: [place];  Service: Vascular;  Laterality: Left;   POLYPECTOMY  09/04/2017   Procedure: POLYPECTOMY;  Surgeon: Ronnette Juniper, MD;  Location: [place];  Service: Gastroenterology;;   REVISON OF ARTERIOVENOUS FISTULA Left 06/19/2016   Procedure: REVISION SUPERFICIALIZATION OF BRACHIOCEPHALIC ARTERIOVENOUS FISTULA;  Surgeon: Angelia Mould, MD;  [facility] OR;  Service: Vascular;  Laterality: Left;    Social History   Socioeconomic History   Marital status: Single    Spouse name: Not on file   Number of children: 3   Years of education: 14   Highest education level: Associate degree: academic program  Occupational History   Occupation: disabled  Tobacco  Use   Smoking status: Never   Smokeless tobacco: Never  Vaping Use   Vaping Use: Never used  Substance and Sexual Activity   Alcohol use: No   Drug use: No   Sexual activity: Not Currently    Birth control/protection: Surgical  Other Topics Concern   Not on file  Social History Narrative   Not on file   Social Determinants of Health   Financial Resource Strain: Not on file  Food Insecurity: Not on file  Transportation Needs: Not on file  Physical Activity: Not on file  Stress: Not on file  Social Connections: Not on file  Intimate Partner Violence: Not on file    Family History  Problem Relation Age of Onset   Hypertension Mother    Thyroid  disease Mother    Coronary artery disease Father    Hypertension Father    Diabetes Father    Colon cancer Maternal Aunt    Esophageal cancer Maternal Uncle    Inflammatory bowel disease Neg Hx    Liver disease Neg Hx    Pancreatic cancer Neg Hx    Rectal cancer Neg Hx    Stomach cancer Neg Hx     Current Outpatient Medications  Medication Sig Dispense Refill   acetaminophen (TYLENOL) 325 MG tablet Take 2 tablets (650 mg total) by mouth every 6 (six) hours as needed for mild pain (or Fever >/= 101).     allopurinol (ZYLOPRIM) 100 MG tablet Take 100 mg by mouth at bedtime.      ALPRAZolam (XANAX) 1 MG tablet Take 1 mg by mouth 2 (two) times daily as needed for anxiety.      atorvastatin (LIPITOR) 40 MG tablet Take 1 tablet (40 mg total) by mouth daily at 6 PM. 30 tablet 3   carbidopa-levodopa (SINEMET IR) 25-100 MG tablet Take 1 tablet by mouth 3 (three) times daily. 90 tablet 3   gabapentin (NEURONTIN) 100 MG capsule Take 1 capsule (100 mg total) by mouth 3 (three) times daily. 90 capsule 11   hydrOXYzine (ATARAX/VISTARIL) 25 MG tablet Take 25 mg by mouth every 6 (six) hours.     metoprolol tartrate (LOPRESSOR) 50 MG tablet Take 100 mg by mouth daily.     multivitamin (RENA-VIT) TABS tablet Take 1 tablet by mouth at bedtime.      omeprazole (PRILOSEC) 40 MG capsule Take 40 mg by mouth in the morning and at bedtime.     promethazine (PHENERGAN) 25 MG tablet Take 1 tablet (25 mg total) by mouth every 8 (eight) hours as needed for nausea or vomiting. 10 tablet 0   zolpidem (AMBIEN) 10 MG tablet Take 10 mg by mouth at bedtime.     ALPRAZolam (XANAX) 0.5 MG tablet Take 1-2 tablets 30 minutes prior to MRI, may repeat once as needed. Must have driver. (Patient not taking: Reported on 02/14/2021) 3 tablet 0   No current facility-administered medications for this visit.    Allergies  Allergen Reactions   Nsaids Other (See Comments)    Cannot take due to Kidney disease/Kidney function     Tolmetin Other (See Comments)    Cannot take due to Kidney Disease     REVIEW OF SYSTEMS:   '[X]'$  denotes positive finding, $RemoveBeforeDEI'[ ]'[id number]$  denotes negative finding Cardiac  Comments:  Chest pain or chest pressure:    Shortness of breath upon exertion:    Short of breath when lying flat:    Irregular heart rhythm:  Vascular    Pain in calf, thigh, or hip brought on by ambulation:    Pain in feet at night that wakes you up from your sleep:     Blood clot in your veins:    Leg swelling:         Pulmonary    Oxygen at home:    Productive cough:     Wheezing:         Neurologic    Sudden weakness in arms or legs:     Sudden numbness in arms or legs:     Sudden onset of difficulty speaking or slurred speech:    Temporary loss of vision in one eye:     Problems with dizziness:         Gastrointestinal    Blood in stool:     Vomited blood:         Genitourinary    Burning when urinating:     Blood in urine:        Psychiatric    Major depression:         Hematologic    Bleeding problems:    Problems with blood clotting too easily:        Skin    Rashes or ulcers:        Constitutional    Fever or chills:      PHYSICAL EXAMINATION:  Vitals:   02/14/21 1221  BP: 116/79  Pulse: 72  Resp: 16  Temp: 98 F (36.7 C)  TempSrc: Temporal  SpO2: 97%  Weight: 235 lb (106.6 kg)  Height: $Remove'5\' 7"'jmJkyig$  (1.702 m)    General:  WDWN in NAD; vital signs documented above Gait: Not observed HENT: WNL, normocephalic Pulmonary: normal non-labored breathing  Cardiac: regular HR,  Abdomen: soft, NT, no masses Skin: without rashes Vascular Exam/Pulses:  Right Left  Radial 2+ (normal) 2+ (normal)  Ulnar 2+ (normal) 2+ (normal)                   Extremities: without ischemic changes, without Gangrene , without cellulitis; without open wounds;  Musculoskeletal: no muscle wasting or atrophy  Neurologic: A&O X 3;  No focal weakness or paresthesias are detected Psychiatric:  The pt  has Normal affect.   Non-Invasive Vascular Imaging:   Demonstrates adequately sized artery bilaterally for brachial artery based fistula Demonstrates adequately sized right cephalic vein, left basilic vein for possible fistula creation    ASSESSMENT/PLAN:  Patricia [name] is a 46 y.o. female who presents with end stage renal disease. She has had multiple previous access attempts in both the left and right arms, and has had tunneled HD lines in bilateral internal jugular veins.  Patricia [name] appears to have adequate vein for right-sided brachiocephalic fistula versus left-sided brachiobasilic fistula.  I am concerned, regarding central stenosis, and therefore her and I discussed the role of bilateral upper extremity venogram in an effort to assess her central venous system.  This would allow for more educated decision regarding fistula placement, and if balloon angioplasty is needed at the time of intervention after discussing the risk and benefits, Patricia [name] elected to proceed  Of note patient has baseline neuropathy in feet and hands.   Broadus John, MD [facility] [phone]

## 2021-02-19 ENCOUNTER — Ambulatory Visit: Payer: Medicare Other | Admitting: Podiatry

## 2021-02-19 ENCOUNTER — Encounter (HOSPITAL_COMMUNITY): Payer: Self-pay | Admitting: Vascular Surgery

## 2021-02-19 ENCOUNTER — Other Ambulatory Visit: Payer: Self-pay

## 2021-02-19 ENCOUNTER — Encounter (HOSPITAL_COMMUNITY)
Admission: RE | Disposition: A | Discharge status: Home / Self Care | Payer: Self-pay | Source: Home / Self Care | Attending: Vascular Surgery

## 2021-02-19 ENCOUNTER — Ambulatory Visit (HOSPITAL_COMMUNITY)
Admission: RE | Admit: 2021-02-19 | Discharge: 2021-02-19 | Disposition: A | Discharge status: Home / Self Care | Payer: Medicare Other | Attending: Vascular Surgery | Admitting: Vascular Surgery

## 2021-02-19 DIAGNOSIS — I82612 Acute embolism and thrombosis of superficial veins of left upper extremity: Secondary | ICD-10-CM | POA: Diagnosis not present

## 2021-02-19 DIAGNOSIS — Z95828 Presence of other vascular implants and grafts: Secondary | ICD-10-CM | POA: Insufficient documentation

## 2021-02-19 DIAGNOSIS — G629 Polyneuropathy, unspecified: Secondary | ICD-10-CM | POA: Insufficient documentation

## 2021-02-19 DIAGNOSIS — I82611 Acute embolism and thrombosis of superficial veins of right upper extremity: Secondary | ICD-10-CM | POA: Diagnosis not present

## 2021-02-19 DIAGNOSIS — Z992 Dependence on renal dialysis: Secondary | ICD-10-CM | POA: Diagnosis not present

## 2021-02-19 DIAGNOSIS — I10 Essential (primary) hypertension: Secondary | ICD-10-CM | POA: Insufficient documentation

## 2021-02-19 DIAGNOSIS — Z01818 Encounter for other preprocedural examination: Secondary | ICD-10-CM | POA: Diagnosis not present

## 2021-02-19 DIAGNOSIS — N186 End stage renal disease: Secondary | ICD-10-CM

## 2021-02-19 HISTORY — PX: UPPER EXTREMITY VENOGRAPHY: CATH118272

## 2021-02-19 LAB — POCT I-STAT, CHEM 8
BUN: 60 mg/dL — ABNORMAL HIGH (ref 6–20)
Calcium, Ion: 1.13 mmol/L — ABNORMAL LOW (ref 1.15–1.40)
Chloride: 103 mmol/L (ref 98–111)
Creatinine, Ser: 12.8 mg/dL — ABNORMAL HIGH (ref 0.44–1.00)
Glucose, Bld: 104 mg/dL — ABNORMAL HIGH (ref 70–99)
HCT: 35 % — ABNORMAL LOW (ref 36.0–46.0)
Hemoglobin: 11.9 g/dL — ABNORMAL LOW (ref 12.0–15.0)
Potassium: 4.8 mmol/L (ref 3.5–5.1)
Sodium: 139 mmol/L (ref 135–145)
TCO2: 24 mmol/L (ref 22–32)

## 2021-02-19 SURGERY — UPPER EXTREMITY VENOGRAPHY
Anesthesia: LOCAL | Laterality: Bilateral

## 2021-02-19 MED ORDER — IODIXANOL 320 MG/ML IV SOLN
INTRAVENOUS | Status: DC | PRN
  Administered 2021-02-19: 48 mL

## 2021-02-19 MED ORDER — SODIUM CHLORIDE 0.9% FLUSH: 3.0000 mL | INTRAVENOUS | Status: DC | PRN

## 2021-02-19 MED ORDER — SODIUM CHLORIDE 0.9% FLUSH: 3.0000 mL | Freq: Two times a day (BID) | INTRAVENOUS | Status: DC

## 2021-02-19 MED ORDER — HEPARIN (PORCINE) IN NACL 1000-0.9 UT/500ML-% IV SOLN: INTRAVENOUS | Status: DC | PRN

## 2021-02-19 MED ORDER — SODIUM CHLORIDE 0.9 % IV SOLN: 250.0000 mL | INTRAVENOUS | Status: DC | PRN

## 2021-02-19 SURGICAL SUPPLY — 2 items
STOPCOCK MORSE 400PSI 3WAY (MISCELLANEOUS) ×2 IMPLANT
TUBING CIL FLEX 10 FLL-RA (TUBING) ×2 IMPLANT

## 2021-02-19 NOTE — Op Note (Signed)
° ° °  Patient name: [name] MRN: [id number] DOB: [birth year] Sex: female  02/19/2021 Pre-operative Diagnosis: End-stage renal disease Post-operative diagnosis:  Same Surgeon:  Broadus John, MD Procedure Performed: 1.  Bilateral upper extremity venogram 2. Central venogram  Indications:  Patricia [name] is a 46 y.o. female who presents with end stage renal disease. She has had multiple previous access attempts in both the left and right arms, and has had tunneled HD lines in bilateral internal jugular veins.  [place] appears to have adequate vein for right-sided brachiocephalic fistula versus left-sided brachiobasilic fistula.  I am concerned, regarding central stenosis, and therefore her and I discussed the role of bilateral upper extremity venogram in an effort to assess her central venous system.  This would allow for more educated decision regarding fistula placement, and if balloon angioplasty is needed at the time of intervention after discussing the risk and benefits, [place] elected to proceed Of note patient has baseline neuropathy in feet and hands.  Findings:   On the right: Patent cephalic vein, without flow-limiting stenosis.  Large lateral superficial branch appreciated.  Occluded basilic vein.  Patent brachial vein.  Patent axillary vein, patent subclavian vein, patent innominate vein without flow-limiting stenosis.  All the left: Occluded cephalic vein.  Patent basilic vein.  Patent brachial vein.  Patent axillary vein, patent subclavian vein, patent innominate vein without flow-limiting stenosis.  Tunneled HD catheter appreciated in the left internal jugular vein  Procedure: Bilateral peripheral Ivs were placed in preoperative holding.  Contrast dye was used to interrogate left and right superficial and deep venous systems, as well as venous return in the chest. Results above  Plan will be for right-sided brachiocephalic fistula creation   Cassandria Santee, MD [facility]: [phone]

## 2021-02-19 NOTE — H&P (Signed)
Office Note    Patient seen and examined in preop holding.  No complaints. No changes to medication history or physical exam since last seen in clinic. After discussing the risks and benefits of bilateral upper extremity venogram, Patricia [name] elected to proceed.    Broadus John MD     CC:  ESRD Requesting Provider:  No ref. provider found   HPI: Patricia [name] is a Right handed 46 y.o. (1975/09/05) female with kidney disease who presents at the request of No ref. provider found for permanent HD access. The patient has had multiple prior access procedures including left-sided radiocephalic at [facility], left-sided brachiocephalic 5631, left-sided AV graft 2019, ligated for steal syndrome, right-sided brachiobasilic. Per pt, previous tunneled lines have been placed in bilateral internal jugular veins. Current access is left-sided IJ TDC.  Patient was has also tried peritoneal dialysis, however suffered an infection and therefore removed the catheter Dialysis days are Tuesday Thursday Saturday.    On exam, Patricia [name] was doing well.  She had no complaints.  She was aware that she would need new vascular access.  She had no questions regarding dialysis, other than if she had usable vein available for fistula creation   The pt is on a statin for cholesterol management.  The pt is not on a daily aspirin.   Other AC:   The pt is  on medications for hypertension.   The pt is not diabetic. Tobacco hx:  never       Past Medical History:  Diagnosis Date   Acid reflux     Anemia 04/2019    REQUIRING BLOOD TRANSFUSIONS   Anxiety     Arthritis     Dyspnea      with exertion   Dysrhythmia      tachycardia   ESRD (end stage renal disease) ([place])      [facility] [place]   GI bleeding 12/2017   Gout     Headache(784.0)      "related to chemo; sometimes weekly" (09/12/2013)   Heart murmur      Systolic per Dr Dixie Dials' notes   High cholesterol     History of blood  transfusion "a couple"    "related to low counts"   Hypertension     Iron deficiency anemia      "get epogen shots q month" (02/20/2014)   MCGN (minimal change glomerulonephritis)      "using chemo to tx;  finished my last tx in 12/2013"   Peritoneal dialysis status ([place])     Stroke ([place]) 08/15/2017    no residual            Past Surgical History:  Procedure Laterality Date   ABDOMINAL HYSTERECTOMY   2010    "laparoscopic"   ANKLE FRACTURE SURGERY Right 1994   AV FISTULA PLACEMENT Left 04/14/2016    Procedure: ARTERIOVENOUS (AV) FISTULA CREATION;  Surgeon: Angelia Mould, MD;  Location: [place];  Service: Vascular;  Laterality: Left;   AV FISTULA PLACEMENT Left 08/09/2017    Procedure: INSERTION OF ARTERIOVENOUS (AV) GORE-TEX GRAFT LEFT ARM;  Surgeon: Elam Dutch, MD;  Location: [place];  Service: Vascular;  Laterality: Left;   AV FISTULA PLACEMENT Right 03/15/2020    Procedure: RIGHT ARM ARTERIOVENOUS (AV) FISTULA CREATION 1st Stage Basilic Transposition;  Surgeon: Serafina Mitchell, MD;  Location: [place];  Service: Vascular;  Laterality: Right;   [place] REMOVAL Left 08/11/2017    Procedure: REMOVAL OF ARTERIOVENOUS GORETEX  GRAFT ([place]);  Surgeon: Serafina Mitchell, MD;  Location: [facility] OR;  Service: Vascular;  Laterality: Left;   [place] Right 05/17/2020    Procedure: RIGHT UPPER EXTREMITY SECOND STAGE [place];  Surgeon: Serafina Mitchell, MD;  Location: [place];  Service: Vascular;  Laterality: Right;   BIOPSY   09/03/2017    Procedure: BIOPSY;  Surgeon: Otis Brace, MD;  Location: [place] ENDOSCOPY;  Service: Gastroenterology;;   BIOPSY   12/24/2017    Procedure: BIOPSY;  Surgeon: Irving Copas., MD;  Location: [place];  Service: Gastroenterology;;   CARDIAC CATHETERIZATION   2000's   COLONOSCOPY WITH PROPOFOL N/A 09/04/2017    Procedure: COLONOSCOPY WITH PROPOFOL;  Surgeon: Ronnette Juniper, MD;  Location: [place];  Service:  Gastroenterology;  Laterality: N/A;   ENTEROSCOPY N/A 12/24/2017    Procedure: ENTEROSCOPY;  Surgeon: Rush Landmark Telford Nab., MD;  Location: [place];  Service: Gastroenterology;  Laterality: N/A;   ESOPHAGOGASTRODUODENOSCOPY       ESOPHAGOGASTRODUODENOSCOPY (EGD) WITH PROPOFOL Left 06/04/2017    Procedure: ESOPHAGOGASTRODUODENOSCOPY (EGD) WITH PROPOFOL;  Surgeon: Arta Silence, MD;  Location: [place];  Service: Endoscopy;  Laterality: Left;   ESOPHAGOGASTRODUODENOSCOPY (EGD) WITH PROPOFOL N/A 09/03/2017    Procedure: ESOPHAGOGASTRODUODENOSCOPY (EGD) WITH PROPOFOL;  Surgeon: Otis Brace, MD;  Location: [place];  Service: Gastroenterology;  Laterality: N/A;   FISTULA SUPERFICIALIZATION Left 04/02/2017    Procedure: FISTULA SUPERFICIALIZATION LEFT ARM;  Surgeon: Serafina Mitchell, MD;  Location: [place];  Service: Vascular;  Laterality: Left;   FISTULOGRAM Left 04/07/2016    Procedure: FISTULOGRAM;  Surgeon: Waynetta Sandy, MD;  Location: [place];  Service: Vascular;  Laterality: Left;   FRACTURE SURGERY        right ankle   GIVENS CAPSULE STUDY N/A 10/29/2017    Procedure: GIVENS CAPSULE STUDY;  Surgeon: Wilford Corner, MD;  Location: [place];  Service: Endoscopy;  Laterality: N/A;   GIVENS CAPSULE STUDY N/A 12/24/2017    Procedure: GIVENS CAPSULE STUDY;  Surgeon: Irving Copas., MD;  Location: [place];  Service: Gastroenterology;  Laterality: N/A;   GIVENS CAPSULE STUDY N/A 05/22/2018    Procedure: GIVENS CAPSULE STUDY;  Surgeon: Irving Copas., MD;  Location: [place];  Service: Gastroenterology;  Laterality: N/A;   INSERTION OF DIALYSIS CATHETER Right 04/07/2016    Procedure: INSERTION OF DIALYSIS CATHETER;  Surgeon: Waynetta Sandy, MD;  Location: [place];  Service: Vascular;  Laterality: Right;   INSERTION OF DIALYSIS CATHETER Right 04/04/2017    Procedure: INSERTION OF DIALYSIS CATHETER;  Surgeon: Waynetta Sandy, MD;   Location: [place];  Service: Vascular;  Laterality: Right;   IR FLUORO GUIDE CV LINE LEFT   03/13/2020   IR US GUIDE VASC ACCESS LEFT   03/13/2020   LEFT HEART CATH AND CORONARY ANGIOGRAPHY N/A 05/12/2019    Procedure: LEFT HEART CATH AND CORONARY ANGIOGRAPHY;  Surgeon: Dixie Dials, MD;  Location: [place] CV LAB;  Service: Cardiovascular;  Laterality: N/A;   LIGATION OF ARTERIOVENOUS  FISTULA Left 04/14/2016    Procedure: LIGATION OF ARTERIOVENOUS  FISTULA;  Surgeon: Angelia Mould, MD;  Location: [place];  Service: Vascular;  Laterality: Left;   LIGATION OF COMPETING BRANCHES OF ARTERIOVENOUS FISTULA Right 05/17/2020    Procedure: LIGATION OF COMPETING BRANCHES OF RIGHT ARM ARTERIOVENOUS FISTULA;  Surgeon: Serafina Mitchell, MD;  Location: [place];  Service: Vascular;  Laterality: Right;   PATCH ANGIOPLASTY Left 06/19/2016    Procedure: PATCH ANGIOPLASTY  LEFT ARTERIOVENOUS FISTULA;  Surgeon: Angelia Mould, MD;  Location: [facility] OR;  Service: Vascular;  Laterality: Left;   POLYPECTOMY   09/04/2017    Procedure: POLYPECTOMY;  Surgeon: Ronnette Juniper, MD;  Location: [facility] ENDOSCOPY;  Service: Gastroenterology;;   REVISON OF ARTERIOVENOUS FISTULA Left 06/19/2016    Procedure: REVISION SUPERFICIALIZATION OF BRACHIOCEPHALIC ARTERIOVENOUS FISTULA;  Surgeon: Angelia Mould, MD;  Location: [facility] OR;  Service: Vascular;  Laterality: Left;      Social History         Socioeconomic History   Marital status: Single      Spouse name: Not on file   Number of children: 3   Years of education: 14   Highest education level: Associate degree: academic program  Occupational History   Occupation: disabled  Tobacco Use   Smoking status: Never   Smokeless tobacco: Never  Vaping Use   Vaping Use: Never used  Substance and Sexual Activity   Alcohol use: No   Drug use: No   Sexual activity: Not Currently      Birth control/protection: Surgical  Other Topics Concern   Not on file  Social History  Narrative   Not on file    Social Determinants of Health    Financial Resource Strain: Not on file  Food Insecurity: Not on file  Transportation Needs: Not on file  Physical Activity: Not on file  Stress: Not on file  Social Connections: Not on file  Intimate Partner Violence: Not on file           Family History  Problem Relation Age of Onset   Hypertension Mother     Thyroid disease Mother     Coronary artery disease Father     Hypertension Father     Diabetes Father     Colon cancer Maternal Aunt     Esophageal cancer Maternal Uncle     Inflammatory bowel disease Neg Hx     Liver disease Neg Hx     Pancreatic cancer Neg Hx     Rectal cancer Neg Hx     Stomach cancer Neg Hx                 Current Facility-Administered Medications  Medication Dose Route Frequency Provider Last Rate Last Admin   0.9 %  sodium chloride infusion  250 mL Intravenous PRN Broadus John, MD       sodium chloride flush (NS) 0.9 % injection 3 mL  3 mL Intravenous Q12H Broadus John, MD       sodium chloride flush (NS) 0.9 % injection 3 mL  3 mL Intravenous PRN Broadus John, MD               Allergies  Allergen Reactions   Nsaids Other (See Comments)      Cannot take due to Kidney disease/Kidney function     Tolmetin Other (See Comments)      Cannot take due to Kidney Disease        REVIEW OF SYSTEMS:    '[X]'$  denotes positive finding, $RemoveBeforeDEI'[ ]'[id number]$  denotes negative finding Cardiac   Comments:  Chest pain or chest pressure:      Shortness of breath upon exertion:      Short of breath when lying flat:      Irregular heart rhythm:             Vascular      Pain in calf, thigh, or hip brought on  by ambulation:      Pain in feet at night that wakes you up from your sleep:       Blood clot in your veins:      Leg swelling:              Pulmonary      Oxygen at home:      Productive cough:       Wheezing:              Neurologic      Sudden weakness in arms or legs:        Sudden numbness in arms or legs:       Sudden onset of difficulty speaking or slurred speech:      Temporary loss of vision in one eye:       Problems with dizziness:              Gastrointestinal      Blood in stool:       Vomited blood:              Genitourinary      Burning when urinating:       Blood in urine:             Psychiatric      Major depression:              Hematologic      Bleeding problems:      Problems with blood clotting too easily:             Skin      Rashes or ulcers:             Constitutional      Fever or chills:          PHYSICAL EXAMINATION:      Vitals:    02/19/21 0720  BP: 125/85  Pulse: 65  Temp: 97.9 F (36.6 C)  TempSrc: Oral  Weight: 104.3 kg  Height: $Remove'5\' 7"'lKxdLCw$  (1.702 m)      General:  WDWN in NAD; vital signs documented above Gait: Not observed HENT: WNL, normocephalic Pulmonary: normal non-labored breathing  Cardiac: regular HR,  Abdomen: soft, NT, no masses Skin: without rashes Vascular Exam/Pulses:   Right Left  Radial 2+ (normal) 2+ (normal)  Ulnar 2+ (normal) 2+ (normal)                                Extremities: without ischemic changes, without Gangrene , without cellulitis; without open wounds;  Musculoskeletal: no muscle wasting or atrophy       Neurologic: A&O X 3;  No focal weakness or paresthesias are detected Psychiatric:  The pt has Normal affect.     Non-Invasive Vascular Imaging:   Demonstrates adequately sized artery bilaterally for brachial artery based fistula Demonstrates adequately sized right cephalic vein, left basilic vein for possible fistula creation       ASSESSMENT/PLAN:   Patricia [name] is a 46 y.o. female who presents with end stage renal disease. She has had multiple previous access attempts in both the left and right arms, and has had tunneled HD lines in bilateral internal jugular veins.  Patricia [name] appears to have adequate vein for right-sided brachiocephalic fistula  versus left-sided brachiobasilic fistula.  I am concerned, regarding central stenosis, and therefore her and I discussed the role of bilateral upper extremity venogram in an effort to assess  her central venous system.  This would allow for more educated decision regarding fistula placement, and if balloon angioplasty is needed at the time of intervention after discussing the risk and benefits, Patricia [name] elected to proceed   Of note patient has baseline neuropathy in feet and hands.     Broadus John, MD [facility] [phone]

## 2021-02-19 NOTE — Progress Notes (Signed)
Office Note   Patient seen and examined in preop holding.  No complaints. No changes to medication history or physical exam since last seen in clinic. After discussing the risks and benefits of bilateral upper extremity venogram, Tamani Royden Purl elected to proceed.   Broadus John MD   CC:  ESRD Requesting Provider:  No ref. provider found  HPI: Patricia [name] is a Right handed 46 y.o. (01-18-1975) female with kidney disease who presents at the request of No ref. provider found for permanent HD access. The patient has had multiple prior access procedures including left-sided radiocephalic at [facility], left-sided brachiocephalic 0017, left-sided AV graft 2019, ligated for steal syndrome, right-sided brachiobasilic. Per pt, previous tunneled lines have been placed in bilateral internal jugular veins. Current access is left-sided IJ TDC.  Patient was has also tried peritoneal dialysis, however suffered an infection and therefore removed the catheter Dialysis days are Tuesday Thursday Saturday.   On exam, [name] was doing well.  She had no complaints.  She was aware that she would need new vascular access.  She had no questions regarding dialysis, other than if she had usable vein available for fistula creation  The pt is on a statin for cholesterol management.  The pt is not on a daily aspirin.   Other AC:   The pt is  on medications for hypertension.   The pt is not diabetic. Tobacco hx:  never  Past Medical History:  Diagnosis Date   Acid reflux    Anemia 04/2019   REQUIRING BLOOD TRANSFUSIONS   Anxiety    Arthritis    Dyspnea    with exertion   Dysrhythmia    tachycardia   ESRD (end stage renal disease) ([place])    [facility] [place]   GI bleeding 12/2017   Gout    Headache(784.0)    "related to chemo; sometimes weekly" (09/12/2013)   Heart murmur    Systolic per Dr Dixie Dials' notes   High cholesterol    History of blood transfusion "a couple"   "related  to low counts"   Hypertension    Iron deficiency anemia    "get epogen shots q month" (02/20/2014)   MCGN (minimal change glomerulonephritis)    "using chemo to tx;  finished my last tx in 12/2013"   Peritoneal dialysis status ([place])    Stroke ([place]) 08/15/2017   no residual     Past Surgical History:  Procedure Laterality Date   ABDOMINAL HYSTERECTOMY  2010   "laparoscopic"   ANKLE FRACTURE SURGERY Right 1994   AV FISTULA PLACEMENT Left 04/14/2016   Procedure: ARTERIOVENOUS (AV) FISTULA CREATION;  Surgeon: Angelia Mould, MD;  Location: [place];  Service: Vascular;  Laterality: Left;   AV FISTULA PLACEMENT Left 08/09/2017   Procedure: INSERTION OF ARTERIOVENOUS (AV) GORE-TEX GRAFT LEFT ARM;  Surgeon: Elam Dutch, MD;  Location: [place];  Service: Vascular;  Laterality: Left;   AV FISTULA PLACEMENT Right 03/15/2020   Procedure: RIGHT ARM ARTERIOVENOUS (AV) FISTULA CREATION 1st Stage Basilic Transposition;  Surgeon: Serafina Mitchell, MD;  Location: [place];  Service: Vascular;  Laterality: Right;   [place] REMOVAL Left 08/11/2017   Procedure: REMOVAL OF ARTERIOVENOUS GORETEX GRAFT ([place]);  Surgeon: Serafina Mitchell, MD;  Location: [facility] OR;  Service: Vascular;  Laterality: Left;   [place] Right 05/17/2020   Procedure: RIGHT UPPER EXTREMITY SECOND STAGE [place];  Surgeon: Serafina Mitchell, MD;  Location: [place];  Service: Vascular;  Laterality: Right;   BIOPSY  09/03/2017   Procedure: BIOPSY;  Surgeon: Otis Brace, MD;  Location: [place] ENDOSCOPY;  Service: Gastroenterology;;   BIOPSY  12/24/2017   Procedure: BIOPSY;  Surgeon: Irving Copas., MD;  Location: [place];  Service: Gastroenterology;;   CARDIAC CATHETERIZATION  2000's   COLONOSCOPY WITH PROPOFOL N/A 09/04/2017   Procedure: COLONOSCOPY WITH PROPOFOL;  Surgeon: Ronnette Juniper, MD;  Location: [place];  Service: Gastroenterology;  Laterality: N/A;   ENTEROSCOPY N/A 12/24/2017    Procedure: ENTEROSCOPY;  Surgeon: Rush Landmark Telford Nab., MD;  Location: [place];  Service: Gastroenterology;  Laterality: N/A;   ESOPHAGOGASTRODUODENOSCOPY     ESOPHAGOGASTRODUODENOSCOPY (EGD) WITH PROPOFOL Left 06/04/2017   Procedure: ESOPHAGOGASTRODUODENOSCOPY (EGD) WITH PROPOFOL;  Surgeon: Arta Silence, MD;  Location: [place];  Service: Endoscopy;  Laterality: Left;   ESOPHAGOGASTRODUODENOSCOPY (EGD) WITH PROPOFOL N/A 09/03/2017   Procedure: ESOPHAGOGASTRODUODENOSCOPY (EGD) WITH PROPOFOL;  Surgeon: Otis Brace, MD;  Location: [place];  Service: Gastroenterology;  Laterality: N/A;   FISTULA SUPERFICIALIZATION Left 04/02/2017   Procedure: FISTULA SUPERFICIALIZATION LEFT ARM;  Surgeon: Serafina Mitchell, MD;  Location: [place];  Service: Vascular;  Laterality: Left;   FISTULOGRAM Left 04/07/2016   Procedure: FISTULOGRAM;  Surgeon: Waynetta Sandy, MD;  Location: [place];  Service: Vascular;  Laterality: Left;   FRACTURE SURGERY     right ankle   GIVENS CAPSULE STUDY N/A 10/29/2017   Procedure: GIVENS CAPSULE STUDY;  Surgeon: Wilford Corner, MD;  Location: [place];  Service: Endoscopy;  Laterality: N/A;   GIVENS CAPSULE STUDY N/A 12/24/2017   Procedure: GIVENS CAPSULE STUDY;  Surgeon: Irving Copas., MD;  Location: [place];  Service: Gastroenterology;  Laterality: N/A;   GIVENS CAPSULE STUDY N/A 05/22/2018   Procedure: GIVENS CAPSULE STUDY;  Surgeon: Irving Copas., MD;  Location: [place];  Service: Gastroenterology;  Laterality: N/A;   INSERTION OF DIALYSIS CATHETER Right 04/07/2016   Procedure: INSERTION OF DIALYSIS CATHETER;  Surgeon: Waynetta Sandy, MD;  Location: [place];  Service: Vascular;  Laterality: Right;   INSERTION OF DIALYSIS CATHETER Right 04/04/2017   Procedure: INSERTION OF DIALYSIS CATHETER;  Surgeon: Waynetta Sandy, MD;  Location: [place];  Service: Vascular;  Laterality: Right;   IR FLUORO GUIDE CV LINE  LEFT  03/13/2020   IR US GUIDE VASC ACCESS LEFT  03/13/2020   LEFT HEART CATH AND CORONARY ANGIOGRAPHY N/A 05/12/2019   Procedure: LEFT HEART CATH AND CORONARY ANGIOGRAPHY;  Surgeon: Dixie Dials, MD;  Location: [place] CV LAB;  Service: Cardiovascular;  Laterality: N/A;   LIGATION OF ARTERIOVENOUS  FISTULA Left 04/14/2016   Procedure: LIGATION OF ARTERIOVENOUS  FISTULA;  Surgeon: Angelia Mould, MD;  Location: [place];  Service: Vascular;  Laterality: Left;   LIGATION OF COMPETING BRANCHES OF ARTERIOVENOUS FISTULA Right 05/17/2020   Procedure: LIGATION OF COMPETING BRANCHES OF RIGHT ARM ARTERIOVENOUS FISTULA;  Surgeon: Serafina Mitchell, MD;  Location: [place];  Service: Vascular;  Laterality: Right;   PATCH ANGIOPLASTY Left 06/19/2016   Procedure: PATCH ANGIOPLASTY LEFT ARTERIOVENOUS FISTULA;  Surgeon: Angelia Mould, MD;  Location: [place];  Service: Vascular;  Laterality: Left;   POLYPECTOMY  09/04/2017   Procedure: POLYPECTOMY;  Surgeon: Ronnette Juniper, MD;  Location: [facility] ENDOSCOPY;  Service: Gastroenterology;;   REVISON OF ARTERIOVENOUS FISTULA Left 06/19/2016   Procedure: REVISION SUPERFICIALIZATION OF BRACHIOCEPHALIC ARTERIOVENOUS FISTULA;  Surgeon: Angelia Mould, MD;  Location: [place];  Service: Vascular;  Laterality: Left;    Social History  Socioeconomic History   Marital status: Single    Spouse name: Not on file   Number of children: 3   Years of education: 14   Highest education level: Associate degree: academic program  Occupational History   Occupation: disabled  Tobacco Use   Smoking status: Never   Smokeless tobacco: Never  Vaping Use   Vaping Use: Never used  Substance and Sexual Activity   Alcohol use: No   Drug use: No   Sexual activity: Not Currently    Birth control/protection: Surgical  Other Topics Concern   Not on file  Social History Narrative   Not on file   Social Determinants of Health   Financial Resource Strain: Not on file  Food  Insecurity: Not on file  Transportation Needs: Not on file  Physical Activity: Not on file  Stress: Not on file  Social Connections: Not on file  Intimate Partner Violence: Not on file    Family History  Problem Relation Age of Onset   Hypertension Mother    Thyroid disease Mother    Coronary artery disease Father    Hypertension Father    Diabetes Father    Colon cancer Maternal Aunt    Esophageal cancer Maternal Uncle    Inflammatory bowel disease Neg Hx    Liver disease Neg Hx    Pancreatic cancer Neg Hx    Rectal cancer Neg Hx    Stomach cancer Neg Hx     Current Facility-Administered Medications  Medication Dose Route Frequency Provider Last Rate Last Admin   0.9 %  sodium chloride infusion  250 mL Intravenous PRN Broadus John, MD       sodium chloride flush (NS) 0.9 % injection 3 mL  3 mL Intravenous Q12H Broadus John, MD       sodium chloride flush (NS) 0.9 % injection 3 mL  3 mL Intravenous PRN Broadus John, MD        Allergies  Allergen Reactions   Nsaids Other (See Comments)    Cannot take due to Kidney disease/Kidney function    Tolmetin Other (See Comments)    Cannot take due to Kidney Disease     REVIEW OF SYSTEMS:   '[X]'$  denotes positive finding, $RemoveBeforeDEI'[ ]'[id number]$  denotes negative finding Cardiac  Comments:  Chest pain or chest pressure:    Shortness of breath upon exertion:    Short of breath when lying flat:    Irregular heart rhythm:        Vascular    Pain in calf, thigh, or hip brought on by ambulation:    Pain in feet at night that wakes you up from your sleep:     Blood clot in your veins:    Leg swelling:         Pulmonary    Oxygen at home:    Productive cough:     Wheezing:         Neurologic    Sudden weakness in arms or legs:     Sudden numbness in arms or legs:     Sudden onset of difficulty speaking or slurred speech:    Temporary loss of vision in one eye:     Problems with dizziness:         Gastrointestinal    Blood in  stool:     Vomited blood:         Genitourinary    Burning when urinating:     Blood in urine:  Psychiatric    Major depression:         Hematologic    Bleeding problems:    Problems with blood clotting too easily:        Skin    Rashes or ulcers:        Constitutional    Fever or chills:      PHYSICAL EXAMINATION:  Vitals:   02/19/21 0720  BP: 125/85  Pulse: 65  Temp: 97.9 F (36.6 C)  TempSrc: Oral  Weight: 104.3 kg  Height: $Remove'5\' 7"'McPIVUA$  (1.702 m)    General:  WDWN in NAD; vital signs documented above Gait: Not observed HENT: WNL, normocephalic Pulmonary: normal non-labored breathing  Cardiac: regular HR,  Abdomen: soft, NT, no masses Skin: without rashes Vascular Exam/Pulses:  Right Left  Radial 2+ (normal) 2+ (normal)  Ulnar 2+ (normal) 2+ (normal)                   Extremities: without ischemic changes, without Gangrene , without cellulitis; without open wounds;  Musculoskeletal: no muscle wasting or atrophy  Neurologic: A&O X 3;  No focal weakness or paresthesias are detected Psychiatric:  The pt has Normal affect.   Non-Invasive Vascular Imaging:   Demonstrates adequately sized artery bilaterally for brachial artery based fistula Demonstrates adequately sized right cephalic vein, left basilic vein for possible fistula creation    ASSESSMENT/PLAN:  Nadege Carriger is a 46 y.o. female who presents with end stage renal disease. She has had multiple previous access attempts in both the left and right arms, and has had tunneled HD lines in bilateral internal jugular veins.  [place] appears to have adequate vein for right-sided brachiocephalic fistula versus left-sided brachiobasilic fistula.  I am concerned, regarding central stenosis, and therefore her and I discussed the role of bilateral upper extremity venogram in an effort to assess her central venous system.  This would allow for more educated decision regarding fistula placement, and  if balloon angioplasty is needed at the time of intervention after discussing the risk and benefits, Jinny elected to proceed  Of note patient has baseline neuropathy in feet and hands.   Broadus John, MD [facility] [phone]

## 2021-02-20 ENCOUNTER — Encounter (HOSPITAL_COMMUNITY): Payer: Self-pay | Admitting: Vascular Surgery

## 2021-02-20 ENCOUNTER — Other Ambulatory Visit: Payer: Self-pay

## 2021-02-20 NOTE — Progress Notes (Signed)
Ms. Tsang denies chest pain or shortness of breath. Patient denies having any s/s of Covid in her household.  Patient denies any known exposure to Covid.   2021, Ms Stuller was admitted with Chest pain.  Patient had a cardiac work up. It was determined to be noncardiac. Ms Tibbits has not see a cardiologist since.  Ms Shanker saw Dr. Krista Blue on 01/13/21, neurologist after patient's daughter noted that patients mouth twitched, and eye rolled back. Ms. Shrewsbury was started on low dose Sinemet IR.  Ms Tess is followed by [facility].  I instructed Ms Coderre to shower with antibiotic soap, if it is available.  Dry off with a clean towel. Do not put lotion, powder, cologne or deodorant or makeup.No jewelry or piercings. Men may shave their face and neck. Woman should not shave. No nail polish, artificial or acrylic nails. Wear clean clothes, brush your teeth. Glasses, contact lens,dentures or partials may not be worn in the OR. If you need to wear them, please bring a case for glasses, do not wear contacts or bring a case, the hospital does not have contact cases, dentures or partials will have to be removed , make sure they are clean, we will provide a denture cup to put them in. You will need some one to drive you home and a responsible person over the age of 79 to stay with you for the first 24 hours after surgery.

## 2021-02-24 ENCOUNTER — Other Ambulatory Visit: Payer: Self-pay

## 2021-02-24 ENCOUNTER — Encounter (HOSPITAL_COMMUNITY)
Admission: RE | Disposition: A | Discharge status: Home / Self Care | Payer: Self-pay | Source: Home / Self Care | Attending: Vascular Surgery

## 2021-02-24 ENCOUNTER — Ambulatory Visit (HOSPITAL_COMMUNITY): Payer: Medicare Other | Admitting: Anesthesiology

## 2021-02-24 ENCOUNTER — Ambulatory Visit (HOSPITAL_BASED_OUTPATIENT_CLINIC_OR_DEPARTMENT_OTHER): Payer: Medicare Other | Admitting: Anesthesiology

## 2021-02-24 ENCOUNTER — Ambulatory Visit (HOSPITAL_COMMUNITY)
Admission: RE | Admit: 2021-02-24 | Discharge: 2021-02-24 | Disposition: A | Discharge status: Home / Self Care | Payer: Medicare Other | Attending: Vascular Surgery | Admitting: Vascular Surgery

## 2021-02-24 DIAGNOSIS — E1140 Type 2 diabetes mellitus with diabetic neuropathy, unspecified: Secondary | ICD-10-CM | POA: Diagnosis not present

## 2021-02-24 DIAGNOSIS — N186 End stage renal disease: Secondary | ICD-10-CM | POA: Diagnosis not present

## 2021-02-24 DIAGNOSIS — Z992 Dependence on renal dialysis: Secondary | ICD-10-CM | POA: Insufficient documentation

## 2021-02-24 DIAGNOSIS — E1122 Type 2 diabetes mellitus with diabetic chronic kidney disease: Secondary | ICD-10-CM

## 2021-02-24 DIAGNOSIS — I132 Hypertensive heart and chronic kidney disease with heart failure and with stage 5 chronic kidney disease, or end stage renal disease: Secondary | ICD-10-CM | POA: Diagnosis not present

## 2021-02-24 DIAGNOSIS — I509 Heart failure, unspecified: Secondary | ICD-10-CM | POA: Diagnosis not present

## 2021-02-24 DIAGNOSIS — I120 Hypertensive chronic kidney disease with stage 5 chronic kidney disease or end stage renal disease: Secondary | ICD-10-CM | POA: Diagnosis not present

## 2021-02-24 DIAGNOSIS — N185 Chronic kidney disease, stage 5: Secondary | ICD-10-CM | POA: Diagnosis not present

## 2021-02-24 HISTORY — PX: AV FISTULA PLACEMENT: SHX1204

## 2021-02-24 HISTORY — DX: Heart failure, unspecified: I50.9

## 2021-02-24 HISTORY — DX: Polyneuropathy, unspecified: G62.9

## 2021-02-24 LAB — GLUCOSE, CAPILLARY: Glucose-Capillary: 95 mg/dL (ref 70–99)

## 2021-02-24 LAB — POCT I-STAT, CHEM 8
BUN: 85 mg/dL — ABNORMAL HIGH (ref 6–20)
Calcium, Ion: 1.1 mmol/L — ABNORMAL LOW (ref 1.15–1.40)
Chloride: 103 mmol/L (ref 98–111)
Creatinine, Ser: 16.2 mg/dL — ABNORMAL HIGH (ref 0.44–1.00)
Glucose, Bld: 94 mg/dL (ref 70–99)
HCT: 39 % (ref 36.0–46.0)
Hemoglobin: 13.3 g/dL (ref 12.0–15.0)
Potassium: 5.1 mmol/L (ref 3.5–5.1)
Sodium: 139 mmol/L (ref 135–145)
TCO2: 25 mmol/L (ref 22–32)

## 2021-02-24 SURGERY — ARTERIOVENOUS (AV) FISTULA CREATION
Anesthesia: Regional | Site: Arm Upper | Laterality: Right

## 2021-02-24 MED ORDER — CHLORHEXIDINE GLUCONATE 0.12 % MT SOLN
OROMUCOSAL | Status: AC
  Administered 2021-02-24: 15 mL via OROMUCOSAL
  Filled 2021-02-24: qty 15

## 2021-02-24 MED ORDER — CHLORHEXIDINE GLUCONATE 4 % EX LIQD: 60.0000 mL | Freq: Once | CUTANEOUS | Status: DC

## 2021-02-24 MED ORDER — ROPIVACAINE HCL 7.5 MG/ML IJ SOLN
INTRAMUSCULAR | Status: DC | PRN
  Administered 2021-02-24: 25 mL via PERINEURAL

## 2021-02-24 MED ORDER — HEPARIN 6000 UNIT IRRIGATION SOLUTION
Status: DC | PRN
  Administered 2021-02-24: 1

## 2021-02-24 MED ORDER — HEPARIN 6000 UNIT IRRIGATION SOLUTION
Status: AC
  Filled 2021-02-24: qty 500

## 2021-02-24 MED ORDER — FENTANYL CITRATE (PF) 250 MCG/5ML IJ SOLN
INTRAMUSCULAR | Status: AC
  Filled 2021-02-24: qty 5

## 2021-02-24 MED ORDER — HEMOSTATIC AGENTS (NO CHARGE) OPTIME
TOPICAL | Status: DC | PRN
  Administered 2021-02-24: 1 via TOPICAL

## 2021-02-24 MED ORDER — LIDOCAINE 2% (20 MG/ML) 5 ML SYRINGE
INTRAMUSCULAR | Status: DC | PRN
  Administered 2021-02-24: 40 mg via INTRAVENOUS

## 2021-02-24 MED ORDER — MIDAZOLAM HCL 2 MG/2ML IJ SOLN
INTRAMUSCULAR | Status: AC
  Administered 2021-02-24: 2 mg
  Filled 2021-02-24: qty 2

## 2021-02-24 MED ORDER — STERILE WATER FOR IRRIGATION IR SOLN
Status: DC | PRN
  Administered 2021-02-24: 1000 mL

## 2021-02-24 MED ORDER — FENTANYL CITRATE (PF) 250 MCG/5ML IJ SOLN
INTRAMUSCULAR | Status: DC | PRN
  Administered 2021-02-24: 50 ug via INTRAVENOUS

## 2021-02-24 MED ORDER — PROMETHAZINE HCL 25 MG/ML IJ SOLN
6.2500 mg | INTRAMUSCULAR | Status: DC | PRN
  Administered 2021-02-24: 6.25 mg via INTRAVENOUS

## 2021-02-24 MED ORDER — ORAL CARE MOUTH RINSE: 15.0000 mL | Freq: Once | OROMUCOSAL | Status: AC

## 2021-02-24 MED ORDER — LIDOCAINE 2% (20 MG/ML) 5 ML SYRINGE
INTRAMUSCULAR | Status: AC
  Filled 2021-02-24: qty 10

## 2021-02-24 MED ORDER — FENTANYL CITRATE (PF) 100 MCG/2ML IJ SOLN: 25.0000 ug | INTRAMUSCULAR | Status: DC | PRN

## 2021-02-24 MED ORDER — PAPAVERINE HCL 30 MG/ML IJ SOLN
INTRAMUSCULAR | Status: AC
  Filled 2021-02-24: qty 2

## 2021-02-24 MED ORDER — 0.9 % SODIUM CHLORIDE (POUR BTL) OPTIME
TOPICAL | Status: DC | PRN
Start: 2021-02-24 — End: 2021-02-24
  Administered 2021-02-24 (×2): 1000 mL

## 2021-02-24 MED ORDER — ACETAMINOPHEN 500 MG PO TABS
1000.0000 mg | ORAL_TABLET | Freq: Once | ORAL | Status: AC
  Administered 2021-02-24: 1000 mg via ORAL
  Filled 2021-02-24: qty 2

## 2021-02-24 MED ORDER — CEFAZOLIN SODIUM-DEXTROSE 2-4 GM/100ML-% IV SOLN
2.0000 g | INTRAVENOUS | Status: AC
Start: 2021-02-24 — End: 2021-02-24
  Administered 2021-02-24: 2 g via INTRAVENOUS
  Filled 2021-02-24: qty 100

## 2021-02-24 MED ORDER — OXYCODONE HCL 5 MG PO TABS: 5.0000 mg | ORAL_TABLET | Freq: Once | ORAL | Status: DC | PRN

## 2021-02-24 MED ORDER — OXYCODONE-ACETAMINOPHEN 5-325 MG PO TABS: 1.0000 | ORAL_TABLET | Freq: Four times a day (QID) | ORAL | 0 refills | Status: DC | PRN

## 2021-02-24 MED ORDER — PROPOFOL 500 MG/50ML IV EMUL
INTRAVENOUS | Status: DC | PRN
  Administered 2021-02-24: 50 ug/kg/min via INTRAVENOUS

## 2021-02-24 MED ORDER — LIDOCAINE HCL (PF) 1 % IJ SOLN
INTRAMUSCULAR | Status: AC
  Filled 2021-02-24: qty 30

## 2021-02-24 MED ORDER — FENTANYL CITRATE (PF) 100 MCG/2ML IJ SOLN
INTRAMUSCULAR | Status: AC
  Filled 2021-02-24: qty 2

## 2021-02-24 MED ORDER — PROMETHAZINE HCL 25 MG/ML IJ SOLN
INTRAMUSCULAR | Status: AC
  Filled 2021-02-24: qty 1

## 2021-02-24 MED ORDER — OXYCODONE HCL 5 MG/5ML PO SOLN: 5.0000 mg | Freq: Once | ORAL | Status: DC | PRN

## 2021-02-24 MED ORDER — CHLORHEXIDINE GLUCONATE 0.12 % MT SOLN: 15.0000 mL | Freq: Once | OROMUCOSAL | Status: AC

## 2021-02-24 MED ORDER — SODIUM CHLORIDE 0.9 % IV SOLN: INTRAVENOUS | Status: DC

## 2021-02-24 SURGICAL SUPPLY — 35 items
ARMBAND PINK RESTRICT EXTREMIT (MISCELLANEOUS) ×2 IMPLANT
BAG COUNTER SPONGE SURGICOUNT (BAG) ×2 IMPLANT
BLADE CLIPPER SURG (BLADE) ×2 IMPLANT
BNDG ELASTIC 4X5.8 VLCR STR LF (GAUZE/BANDAGES/DRESSINGS) ×1 IMPLANT
CANISTER SUCT 3000ML PPV (MISCELLANEOUS) ×2 IMPLANT
CLIP LIGATING EXTRA MED SLVR (CLIP) ×3 IMPLANT
CLIP LIGATING EXTRA SM BLUE (MISCELLANEOUS) ×3 IMPLANT
COVER PROBE W GEL 5X96 (DRAPES) ×2 IMPLANT
DERMABOND ADVANCED (GAUZE/BANDAGES/DRESSINGS) ×1
DERMABOND ADVANCED .7 DNX12 (GAUZE/BANDAGES/DRESSINGS) ×1 IMPLANT
ELECT REM PT RETURN 9FT ADLT (ELECTROSURGICAL) ×2
ELECTRODE REM PT RTRN 9FT ADLT (ELECTROSURGICAL) ×1 IMPLANT
GAUZE 4X4 16PLY ~~LOC~~+RFID DBL (SPONGE) ×1 IMPLANT
GLOVE SRG 8 PF TXTR STRL LF DI (GLOVE) ×2 IMPLANT
GLOVE SURG UNDER POLY LF SZ8 (GLOVE) ×2
GOWN STRL REUS W/ TWL LRG LVL3 (GOWN DISPOSABLE) ×2 IMPLANT
GOWN STRL REUS W/TWL 2XL LVL3 (GOWN DISPOSABLE) ×2 IMPLANT
GOWN STRL REUS W/TWL LRG LVL3 (GOWN DISPOSABLE) ×2
HEMOSTAT SNOW SURGICEL 2X4 (HEMOSTASIS) ×1 IMPLANT
KIT BASIN OR (CUSTOM PROCEDURE TRAY) ×2 IMPLANT
KIT TURNOVER KIT B (KITS) ×2 IMPLANT
NS IRRIG 1000ML POUR BTL (IV SOLUTION) ×2 IMPLANT
PACK CV ACCESS (CUSTOM PROCEDURE TRAY) ×2 IMPLANT
PAD ARMBOARD 7.5X6 YLW CONV (MISCELLANEOUS) ×4 IMPLANT
SPONGE T-LAP 18X18 ~~LOC~~+RFID (SPONGE) ×1 IMPLANT
SUT MNCRL AB 4-0 PS2 18 (SUTURE) ×2 IMPLANT
SUT PROLENE 6 0 BV (SUTURE) ×3 IMPLANT
SUT PROLENE 7 0 BV 1 (SUTURE) IMPLANT
SUT SILK 2 0 SH (SUTURE) ×2 IMPLANT
SUT SILK 3 0 SH CR/8 (SUTURE) ×2 IMPLANT
SUT VIC AB 3-0 SH 27 (SUTURE) ×1
SUT VIC AB 3-0 SH 27X BRD (SUTURE) ×1 IMPLANT
TOWEL GREEN STERILE (TOWEL DISPOSABLE) ×2 IMPLANT
UNDERPAD 30X36 HEAVY ABSORB (UNDERPADS AND DIAPERS) ×2 IMPLANT
WATER STERILE IRR 1000ML POUR (IV SOLUTION) ×2 IMPLANT

## 2021-02-24 NOTE — Anesthesia Procedure Notes (Signed)
Anesthesia Regional Block: Supraclavicular block   Pre-Anesthetic Checklist: , timeout performed,  Correct Patient, Correct Site, Correct Laterality,  Correct Procedure, Correct Position, site marked,  Risks and benefits discussed,  Surgical consent,  Pre-op evaluation,  At surgeon's request and post-op pain management  Laterality: Right  Prep: chloraprep       Needles:  Injection technique: Single-shot  Needle Type: Echogenic Stimulator Needle     Needle Length: 5cm  Needle Gauge: 22     Additional Needles:   Procedures:, nerve stimulator,,, ultrasound used (permanent image in chart),,     Nerve Stimulator or Paresthesia:  Response: hand, 0.45 mA  Additional Responses:   Narrative:  Start time: 02/24/2021 12:30 PM End time: 02/24/2021 12:35 PM Injection made incrementally with aspirations every 5 mL.  Performed by: Personally  Anesthesiologist: Janeece Riggers, MD  Additional Notes: Functioning IV was confirmed and monitors were applied.  A 57mm 22ga Arrow echogenic stimulator needle was used. Sterile prep and drape,hand hygiene and sterile gloves were used. Ultrasound guidance: relevant anatomy identified, needle position confirmed, local anesthetic spread visualized around nerve(s)., vascular puncture avoided.  Image printed for medical record. Negative aspiration and negative test dose prior to incremental administration of local anesthetic. The patient tolerated the procedure well.

## 2021-02-24 NOTE — Anesthesia Postprocedure Evaluation (Signed)
Anesthesia Post Note  Patient: Patricia [name]  Procedure(s) Performed: RIGHT ARM ARTERIOVENOUS  FISTULA CREATION (Right: Arm Upper)     Patient location during evaluation: PACU Anesthesia Type: Regional and MAC Level of consciousness: awake and alert Pain management: pain level controlled Vital Signs Assessment: post-procedure vital signs reviewed and stable Respiratory status: spontaneous breathing, nonlabored ventilation, respiratory function stable and patient connected to nasal cannula oxygen Cardiovascular status: stable and blood pressure returned to baseline Postop Assessment: no apparent nausea or vomiting Anesthetic complications: no   No notable events documented.  Last Vitals:  Vitals:   02/24/21 1420 02/24/21 1433  BP: (!) 153/87 (!) 156/91  Pulse: 74 73  Resp: 19 19  Temp:  36.6 C  SpO2: 96% 96%    Last Pain:  Vitals:   02/24/21 1433  TempSrc:   PainSc: 0-No pain                 Maricela Kawahara

## 2021-02-24 NOTE — Discharge Instructions (Signed)
[facility]  Discharge Instructions  AV Fistula or Graft Surgery for Dialysis Access  Please refer to the following instructions for your post-procedure care. Your surgeon or physician assistant will discuss any changes with you.  Activity  You may drive the day following your surgery, if you are comfortable and no longer taking prescription pain medication. Resume full activity as the soreness in your incision resolves.  Bathing/Showering  You may shower after you go home. Keep your incision dry for 48 hours. Do not soak in a bathtub, hot tub, or swim until the incision heals completely. You may not shower if you have a hemodialysis catheter.  Incision Care  Clean your incision with mild soap and water after 48 hours. Pat the area dry with a clean towel. You do not need a bandage unless otherwise instructed. Do not apply any ointments or creams to your incision. You may have skin glue on your incision. Do not peel it off. It will come off on its own in about one week. Your arm may swell a bit after surgery. To reduce swelling use pillows to elevate your arm so it is above your heart. Your doctor will tell you if you need to lightly wrap your arm with an ACE bandage.  Diet  Resume your normal diet. There are not special food restrictions following this procedure. In order to heal from your surgery, it is CRITICAL to get adequate nutrition. Your body requires vitamins, minerals, and protein. Vegetables are the best source of vitamins and minerals. Vegetables also provide the perfect balance of protein. Processed food has little nutritional value, so try to avoid this.  Medications  Resume taking all of your medications. If your incision is causing pain, you may take over-the counter pain relievers such as acetaminophen (Tylenol). If you were prescribed a stronger pain medication, please be aware these medications can cause nausea and constipation. Prevent  nausea by taking the medication with a snack or meal. Avoid constipation by drinking plenty of fluids and eating foods with high amount of fiber, such as fruits, vegetables, and grains.  Do not take Tylenol if you are taking prescription pain medications.  Follow up Your surgeon may want to see you in the office following your access surgery. If so, this will be arranged at the time of your surgery.  Please call us immediately for any of the following conditions:  Increased pain, redness, drainage (pus) from your incision site Fever of 101 degrees or higher Severe or worsening pain at your incision site Hand pain or numbness.  Reduce your risk of vascular disease:  Stop smoking. If you would like help, call [facility] at 1-800-QUIT-NOW (979)246-0423) or [facility] at [place] your cholesterol Maintain a desired weight Control your diabetes Keep your blood pressure down  Dialysis  It will take several weeks to several months for your new dialysis access to be ready for use. Your surgeon will determine when it is okay to use it. Your nephrologist will continue to direct your dialysis. You can continue to use your Permcath until your new access is ready for use.   02/24/2021 Patricia [name] [id number] 21-Oct-1975  Surgeon(s): Broadus John, MD  Procedure(s): RIGHT ARM ARTERIOVENOUS  FISTULA CREATION   May stick graft immediately   May stick graft on designated area only:   X Do not stick right AV fistula for 12 weeks    If you have any questions, please call the office  at [phone].

## 2021-02-24 NOTE — Op Note (Signed)
° ° °  NAME: Patricia [name]    MRN: [id number] DOB: [birth year]    DATE OF OPERATION: 02/24/2021  PREOP DIAGNOSIS:    End-stage renal disease  POSTOP DIAGNOSIS:    Same  PROCEDURE:    Right arm brachiocephalic fistula  SURGEON: Broadus John  ASSIST: Paulo Fruit  ANESTHESIA: General  EBL: 50 mL  INDICATIONS:    Patricia [name] is a 46 y.o. female with end-stage renal disease and multiple previous access procedures.  She underwent bilateral upper extremity venogram last week demonstrating usable right-sided cephalic vein.  There was no central stenosis.  After discussing the risk and benefits of right-sided brachiocephalic fistula creation, Patricia [name] elected to proceed  FINDINGS:   Sclerotic 4.0 mm cephalic vein 3.5 mm brachial artery Redo brachial artery exposure  TECHNIQUE:   The patient was brought to the operating room and placed in supine position. The right arm was prepped and draped in standard fashion. IV antibiotics were administered. A timeout was performed.   The case began with ultrasound insonation of the brachial artery and cephalic vein, which demonstrated sufficient size at the antecubital fossa for arteriovenous fistula.   A transverse incision was made below the elbow creese in the antecubital fossa. The  cephalic vein was isolated for 3 cm in length. Next the  aponeurosis was partially released and the brachial artery secured with a vessel loop.  This was tedious, due to redo brachial artery exposure the patient was heparinized. The cephalic vein was transected and ligated distally with a 2-0 silk stick-tie. The vein was dilated with coronary dilators and flushed with heparin saline. Vascular clamps were placed proximally and distally on the brachial artery and a 5 mm arteriotomy was created on the brachial artery. This was flushed with heparin saline.  An anastomosis was created in end to side fashion on the brachial artery using running 6-0  Prolene suture.  Prior to completing the anastomosis, the vessels were flushed and the suture line was tied down. There was an excellent thrill in the cephalic vein from the anastomosis to the mid upper bicipital region. The patient had a multiphasic radial and ulnar signal. He had an excellent doppler signal in the fistula. The incision was irrigated and hemostasis achieved with cautery and suture. The deeper tissue was closed with 3-0 Vicryl and the skin closed with 4-0 Monocryl.  Dermabond was applied to the incisions. The patient was transferred to PACU in stable condition.   Given the complexity of the case a first assistant was necessary in order to expedient the procedure and safely perform the technical aspects of the operation.  Macie Burows, MD [facility]  DATE OF DICTATION:   02/24/2021

## 2021-02-24 NOTE — Transfer of Care (Signed)
Immediate Anesthesia Transfer of Care Note  Patient: Patricia [name]  Procedure(s) Performed: RIGHT ARM ARTERIOVENOUS  FISTULA CREATION (Right: Arm Upper)  Patient Location: PACU  Anesthesia Type:MAC combined with regional for post-op pain  Level of Consciousness: awake, alert  and oriented  Airway & Oxygen Therapy: Patient Spontanous Breathing and Patient connected to nasal cannula oxygen  Post-op Assessment: Report given to RN and Post -op Vital signs reviewed and stable  Post vital signs: Reviewed and stable  Last Vitals:  Vitals Value Taken Time  BP 156/91 02/24/21 1433  Temp 36.6 C 02/24/21 1433  Pulse 74 02/24/21 1433  Resp 19 02/24/21 1433  SpO2 95 % 02/24/21 1433  Vitals shown include unvalidated device data.  Last Pain:  Vitals:   02/24/21 1433  TempSrc:   PainSc: 0-No pain         Complications: No notable events documented.

## 2021-02-24 NOTE — Anesthesia Preprocedure Evaluation (Addendum)
Anesthesia Evaluation  Patient identified by MRN, date of birth, ID band Patient awake    Reviewed: Allergy & Precautions, NPO status , Patient's Chart, lab work & pertinent test results  History of Anesthesia Complications Negative for: history of anesthetic complications  Airway Mallampati: II  TM Distance: >3 FB Neck ROM: Full    Dental no notable dental hx. (+) Teeth Intact, Dental Advisory Given   Pulmonary neg pulmonary ROS,    Pulmonary exam normal breath sounds clear to auscultation       Cardiovascular hypertension, Pt. on home beta blockers and Pt. on medications +CHF  Normal cardiovascular exam+ dysrhythmias Supra Ventricular Tachycardia  Rhythm:Regular Rate:Normal   '21 TTE - EF 55 to 60%. Grade I diastolic dysfunction (impaired relaxation).  Trivial MR    Neuro/Psych  Headaches, PSYCHIATRIC DISORDERS Anxiety  Neuromuscular disease CVA negative psych ROS   GI/Hepatic Neg liver ROS, GERD  Medicated,  Endo/Other  diabetes Obesity   Renal/GU ESRF and DialysisRenal disease  negative genitourinary   Musculoskeletal  (+) Arthritis ,   Abdominal   Peds negative pediatric ROS (+)  Hematology  (+) Blood dyscrasia, anemia ,   Anesthesia Other Findings   Reproductive/Obstetrics negative OB ROS                            Anesthesia Physical Anesthesia Plan  ASA: 3  Anesthesia Plan: Regional and MAC   Post-op Pain Management: Regional block*   Induction:   PONV Risk Score and Plan: 2 and Propofol infusion and Treatment may vary due to age or medical condition  Airway Management Planned: Natural Airway and Simple Face Mask  Additional Equipment: None  Intra-op Plan:   Post-operative Plan:   Informed Consent:   Plan Discussed with: CRNA and Anesthesiologist  Anesthesia Plan Comments:        Anesthesia Quick Evaluation

## 2021-02-24 NOTE — H&P (Signed)
Office Note    Patient seen and examined in preop holding.  No complaints. No changes to medication history or physical exam since last seen in clinic. In the interim, Patricia [name] underwent bilateral upper extremity venogram in an effort to define usable superficial vein, as well as possible central stenosis.  This demonstrated usable right-sided cephalic vein, with no central stenosis on the right side. After discussing the risks and benefits of right-sided brachiocephalic fistula creation, Patricia [name] elected to proceed.   Broadus John MD      CC:  ESRD Requesting Provider:  No ref. provider found   HPI: Patricia [name] is a Right handed 46 y.o. (14-Feb-1975) female with kidney disease who presents at the request of No ref. provider found for permanent HD access. The patient has had multiple prior access procedures including left-sided radiocephalic at [facility], left-sided brachiocephalic 4650, left-sided AV graft 2019, ligated for steal syndrome, right-sided brachiobasilic. Per pt, previous tunneled lines have been placed in bilateral internal jugular veins. Current access is left-sided IJ TDC.  Patient was has also tried peritoneal dialysis, however suffered an infection and therefore removed the catheter Dialysis days are Tuesday Thursday Saturday.    On exam, Patricia [name] was doing well.  She had no complaints.  She was aware that she would need new vascular access.  She had no questions regarding dialysis, other than if she had usable vein available for fistula creation   The pt is on a statin for cholesterol management.  The pt is not on a daily aspirin.   Other AC:   The pt is  on medications for hypertension.   The pt is not diabetic. Tobacco hx:  never       Past Medical History:  Diagnosis Date   Acid reflux     Anemia 04/2019    REQUIRING BLOOD TRANSFUSIONS   Anxiety     Arthritis     Dyspnea      with exertion   Dysrhythmia      tachycardia   ESRD (end  stage renal disease) ([place])      [facility] [place]   GI bleeding 12/2017   Gout     Headache(784.0)      "related to chemo; sometimes weekly" (09/12/2013)   Heart murmur      Systolic per Dr Dixie Dials' notes   High cholesterol     History of blood transfusion "a couple"    "related to low counts"   Hypertension     Iron deficiency anemia      "get epogen shots q month" (02/20/2014)   MCGN (minimal change glomerulonephritis)      "using chemo to tx;  finished my last tx in 12/2013"   Peritoneal dialysis status ([place])     Stroke ([place]) 08/15/2017    no residual            Past Surgical History:  Procedure Laterality Date   ABDOMINAL HYSTERECTOMY   2010    "laparoscopic"   ANKLE FRACTURE SURGERY Right 1994   AV FISTULA PLACEMENT Left 04/14/2016    Procedure: ARTERIOVENOUS (AV) FISTULA CREATION;  Surgeon: Angelia Mould, MD;  Location: [place];  Service: Vascular;  Laterality: Left;   AV FISTULA PLACEMENT Left 08/09/2017    Procedure: INSERTION OF ARTERIOVENOUS (AV) GORE-TEX GRAFT LEFT ARM;  Surgeon: Elam Dutch, MD;  Location: [place];  Service: Vascular;  Laterality: Left;   AV FISTULA PLACEMENT Right 03/15/2020    Procedure: RIGHT ARM  ARTERIOVENOUS (AV) FISTULA CREATION 1st Stage Basilic Transposition;  Surgeon: Serafina Mitchell, MD;  [facility];  Service: Vascular;  Laterality: Right;   [place] Left 08/11/2017    Procedure: REMOVAL OF ARTERIOVENOUS GORETEX GRAFT ([place]);  Surgeon: Serafina Mitchell, MD;  Location: [facility] OR;  Service: Vascular;  Laterality: Left;   [place] Right 05/17/2020    Procedure: RIGHT UPPER EXTREMITY SECOND STAGE [place];  Surgeon: Serafina Mitchell, MD;  Location: [place];  Service: Vascular;  Laterality: Right;   BIOPSY   09/03/2017    Procedure: BIOPSY;  Surgeon: Otis Brace, MD;  Location: [place] [facility]COPY;  Service: Gastroenterology;;   BIOPSY   12/24/2017    Procedure: BIOPSY;  Surgeon: Irving Copas., MD;  Location: [place];  Service: Gastroenterology;;   CARDIAC CATHETERIZATION   2000's   COLONOSCOPY WITH PROPOFOL N/A 09/04/2017    Procedure: COLONOSCOPY WITH PROPOFOL;  Surgeon: Ronnette Juniper, MD;  Location: [place];  Service: Gastroenterology;  Laterality: N/A;   ENTEROSCOPY N/A 12/24/2017    Procedure: ENTEROSCOPY;  Surgeon: Rush Landmark Telford Nab., MD;  Location: [place];  Service: Gastroenterology;  Laterality: N/A;   ESOPHAGOGASTRODUODENOSCOPY       ESOPHAGOGASTRODUODENOSCOPY (EGD) WITH PROPOFOL Left 06/04/2017    Procedure: ESOPHAGOGASTRODUODENOSCOPY (EGD) WITH PROPOFOL;  Surgeon: Arta Silence, MD;  Location: [place];  Service: Endoscopy;  Laterality: Left;   ESOPHAGOGASTRODUODENOSCOPY (EGD) WITH PROPOFOL N/A 09/03/2017    Procedure: ESOPHAGOGASTRODUODENOSCOPY (EGD) WITH PROPOFOL;  Surgeon: Otis Brace, MD;  Location: [place];  Service: Gastroenterology;  Laterality: N/A;   FISTULA SUPERFICIALIZATION Left 04/02/2017    Procedure: FISTULA SUPERFICIALIZATION LEFT ARM;  Surgeon: Serafina Mitchell, MD;  Location: [place];  Service: Vascular;  Laterality: Left;   FISTULOGRAM Left 04/07/2016    Procedure: FISTULOGRAM;  Surgeon: Waynetta Sandy, MD;  Location: [place];  Service: Vascular;  Laterality: Left;   FRACTURE SURGERY        right ankle   GIVENS CAPSULE STUDY N/A 10/29/2017    Procedure: GIVENS CAPSULE STUDY;  Surgeon: Wilford Corner, MD;  Location: [place];  Service: Endoscopy;  Laterality: N/A;   GIVENS CAPSULE STUDY N/A 12/24/2017    Procedure: GIVENS CAPSULE STUDY;  Surgeon: Irving Copas., MD;  Location: [place];  Service: Gastroenterology;  Laterality: N/A;   GIVENS CAPSULE STUDY N/A 05/22/2018    Procedure: GIVENS CAPSULE STUDY;  Surgeon: Irving Copas., MD;  Location: [place];  Service: Gastroenterology;  Laterality: N/A;   INSERTION OF DIALYSIS CATHETER Right 04/07/2016    Procedure: INSERTION OF DIALYSIS  CATHETER;  Surgeon: Waynetta Sandy, MD;  Location: [place];  Service: Vascular;  Laterality: Right;   INSERTION OF DIALYSIS CATHETER Right 04/04/2017    Procedure: INSERTION OF DIALYSIS CATHETER;  Surgeon: Waynetta Sandy, MD;  Location: [place];  Service: Vascular;  Laterality: Right;   IR FLUORO GUIDE CV LINE LEFT   03/13/2020   IR US GUIDE VASC ACCESS LEFT   03/13/2020   LEFT HEART CATH AND CORONARY ANGIOGRAPHY N/A 05/12/2019    Procedure: LEFT HEART CATH AND CORONARY ANGIOGRAPHY;  Surgeon: Dixie Dials, MD;  Location: [place] CV LAB;  Service: Cardiovascular;  Laterality: N/A;   LIGATION OF ARTERIOVENOUS  FISTULA Left 04/14/2016    Procedure: LIGATION OF ARTERIOVENOUS  FISTULA;  Surgeon: Angelia Mould, MD;  Location: [place];  Service: Vascular;  Laterality: Left;   LIGATION OF COMPETING BRANCHES OF ARTERIOVENOUS FISTULA Right 05/17/2020  Procedure: LIGATION OF COMPETING BRANCHES OF RIGHT ARM ARTERIOVENOUS FISTULA;  Surgeon: Serafina Mitchell, MD;  [facility];  Service: Vascular;  Laterality: Right;   PATCH ANGIOPLASTY Left 06/19/2016    Procedure: PATCH ANGIOPLASTY LEFT ARTERIOVENOUS FISTULA;  Surgeon: Angelia Mould, MD;  [facility] OR;  Service: Vascular;  Laterality: Left;   POLYPECTOMY   09/04/2017    Procedure: POLYPECTOMY;  Surgeon: Ronnette Juniper, MD;  [facility];  Service: Gastroenterology;;   REVISON OF ARTERIOVENOUS FISTULA Left 06/19/2016    Procedure: REVISION SUPERFICIALIZATION OF BRACHIOCEPHALIC ARTERIOVENOUS FISTULA;  Surgeon: Angelia Mould, MD;  Location: [facility] OR;  Service: Vascular;  Laterality: Left;      Social History         Socioeconomic History   Marital status: Single      Spouse name: Not on file   Number of children: 3   Years of education: 14   Highest education level: Associate degree: academic program  Occupational History   Occupation: disabled  Tobacco Use   Smoking status: Never   Smokeless tobacco:  Never  Vaping Use   Vaping Use: Never used  Substance and Sexual Activity   Alcohol use: No   Drug use: No   Sexual activity: Not Currently      Birth control/protection: Surgical  Other Topics Concern   Not on file  Social History Narrative   Not on file    Social Determinants of Health    Financial Resource Strain: Not on file  Food Insecurity: Not on file  Transportation Needs: Not on file  Physical Activity: Not on file  Stress: Not on file  Social Connections: Not on file  Intimate Partner Violence: Not on file           Family History  Problem Relation Age of Onset   Hypertension Mother     Thyroid disease Mother     Coronary artery disease Father     Hypertension Father     Diabetes Father     Colon cancer Maternal Aunt     Esophageal cancer Maternal Uncle     Inflammatory bowel disease Neg Hx     Liver disease Neg Hx     Pancreatic cancer Neg Hx     Rectal cancer Neg Hx     Stomach cancer Neg Hx                 Current Facility-Administered Medications  Medication Dose Route Frequency Provider Last Rate Last Admin   0.9 %  sodium chloride infusion  250 mL Intravenous PRN Broadus John, MD       sodium chloride flush (NS) 0.9 % injection 3 mL  3 mL Intravenous Q12H Broadus John, MD       sodium chloride flush (NS) 0.9 % injection 3 mL  3 mL Intravenous PRN Broadus John, MD               Allergies  Allergen Reactions   Nsaids Other (See Comments)      Cannot take due to Kidney disease/Kidney function     Tolmetin Other (See Comments)      Cannot take due to Kidney Disease        REVIEW OF SYSTEMS:    '[X]'$  denotes positive finding, $RemoveBeforeDEI'[ ]'[id number]$  denotes negative finding Cardiac   Comments:  Chest pain or chest pressure:      Shortness of breath upon exertion:      Short of  breath when lying flat:      Irregular heart rhythm:             Vascular      Pain in calf, thigh, or hip brought on by ambulation:      Pain in feet at night that wakes  you up from your sleep:       Blood clot in your veins:      Leg swelling:              Pulmonary      Oxygen at home:      Productive cough:       Wheezing:              Neurologic      Sudden weakness in arms or legs:       Sudden numbness in arms or legs:       Sudden onset of difficulty speaking or slurred speech:      Temporary loss of vision in one eye:       Problems with dizziness:              Gastrointestinal      Blood in stool:       Vomited blood:              Genitourinary      Burning when urinating:       Blood in urine:             Psychiatric      Major depression:              Hematologic      Bleeding problems:      Problems with blood clotting too easily:             Skin      Rashes or ulcers:             Constitutional      Fever or chills:          PHYSICAL EXAMINATION:      Vitals:    02/19/21 0720  BP: 125/85  Pulse: 65  Temp: 97.9 F (36.6 C)  TempSrc: Oral  Weight: 104.3 kg  Height: $Remove'5\' 7"'XTpLPhU$  (1.702 m)      General:  WDWN in NAD; vital signs documented above Gait: Not observed HENT: WNL, normocephalic Pulmonary: normal non-labored breathing  Cardiac: regular HR,  Abdomen: soft, NT, no masses Skin: without rashes Vascular Exam/Pulses:   Right Left  Radial 2+ (normal) 2+ (normal)  Ulnar 2+ (normal) 2+ (normal)                                Extremities: without ischemic changes, without Gangrene , without cellulitis; without open wounds;  Musculoskeletal: no muscle wasting or atrophy       Neurologic: A&O X 3;  No focal weakness or paresthesias are detected Psychiatric:  The pt has Normal affect.     Non-Invasive Vascular Imaging:   Demonstrates adequately sized artery bilaterally for brachial artery based fistula Demonstrates adequately sized right cephalic vein, left basilic vein for possible fistula creation       ASSESSMENT/PLAN:   Brandolyn Shortridge is a 46 y.o. female who presents with end stage renal  disease. She has had multiple previous access attempts in both the left and right arms, and has had tunneled HD lines in bilateral internal jugular veins.  Cammie  appears to have adequate vein for right-sided brachiocephalic fistula versus left-sided brachiobasilic fistula.  I am concerned, regarding central stenosis, and therefore her and I discussed the role of bilateral upper extremity venogram in an effort to assess her central venous system.  This would allow for more educated decision regarding fistula placement, and if balloon angioplasty is needed at the time of intervention after discussing the risk and benefits, Solene elected to proceed   Of note patient has baseline neuropathy in feet and hands.     Broadus John, MD [facility] [phone]

## 2021-02-24 NOTE — Anesthesia Procedure Notes (Signed)
Procedure Name: MAC Date/Time: 02/24/2021 12:39 PM Performed by: Mariea Clonts, CRNA Pre-anesthesia Checklist: Patient identified, Emergency Drugs available, Suction available, Patient being monitored and Timeout performed Patient Re-evaluated:Patient Re-evaluated prior to induction Oxygen Delivery Method: Simple face mask

## 2021-02-25 ENCOUNTER — Encounter (HOSPITAL_COMMUNITY): Payer: Self-pay | Admitting: Vascular Surgery

## 2021-02-26 ENCOUNTER — Ambulatory Visit: Payer: Medicare Other | Admitting: Podiatry

## 2021-02-26 ENCOUNTER — Ambulatory Visit (INDEPENDENT_AMBULATORY_CARE_PROVIDER_SITE_OTHER): Payer: Medicare Other

## 2021-02-26 ENCOUNTER — Other Ambulatory Visit: Payer: Self-pay

## 2021-02-26 DIAGNOSIS — G609 Hereditary and idiopathic neuropathy, unspecified: Secondary | ICD-10-CM

## 2021-02-26 DIAGNOSIS — M216X1 Other acquired deformities of right foot: Secondary | ICD-10-CM | POA: Diagnosis not present

## 2021-02-26 DIAGNOSIS — L97512 Non-pressure chronic ulcer of other part of right foot with fat layer exposed: Secondary | ICD-10-CM

## 2021-02-26 DIAGNOSIS — Z01818 Encounter for other preprocedural examination: Secondary | ICD-10-CM

## 2021-02-28 ENCOUNTER — Telehealth: Payer: Self-pay | Admitting: Urology

## 2021-02-28 NOTE — Telephone Encounter (Signed)
DOS - 03/31/21   METATARSAL OSTEOTOMY 5TH RIGHT --- [address]308  [facility] EFFECTIVE DATE - 01/05/21  PLAN DEDUCTIBLE - $0.00 OUT OF POCKET - $4,500.00 W/ $3,981.75 REMAINING COINSURANCE - 0% COPAY - $325.00   PER [employer] WEBSITE FOR CPT CODE 35701 Notification or Prior Authorization is not required for the requested services  Decision ID #:[id number]

## 2021-02-28 NOTE — Progress Notes (Signed)
Subjective:  Patient ID: Patricia [name], female    DOB: 07-22-1975,  MRN: [id number]  Chief Complaint  Patient presents with   Callouses    46 y.o. female presents for wound care.  Patient presents follow-up with a right fifth metatarsal ulceration.  She states the wound is about the same.  Does not her pain.  She is ambulating with surgical shoe.  She is would like to discuss surgical plan to take the pressure off of this.  She denies any other acute complaints.   Review of Systems: Negative except as noted in the HPI. Denies N/V/F/Ch.  Past Medical History:  Diagnosis Date   Acid reflux    Anemia 04/2019   REQUIRING BLOOD TRANSFUSIONS   Anxiety    Arthritis    CHF (congestive heart failure) (H[facility])    Dyspnea    with exertion   Dysrhythmia    tachycardia   ESRD (end stage renal disease) ([facility])    [facility] [place]   Facial twitching 11/2020   mouth twitch, eyes roll back, neurology started patient on low dose   Sinemet IR.   GI bleeding 12/2017   Gout    Headache(784.0)    "related to chemo; sometimes weekly" (09/12/2013)   Heart murmur    Systolic per Dr Orpah Cobb' notes   High cholesterol    History of blood transfusion "a couple"   "related to low counts"   Hypertension    Iron deficiency anemia    "get epogen shots q month" (02/20/2014)   MCGN (minimal change glomerulonephritis)    "using chemo to tx;  finished my last tx in 12/2013"   Neuropathy    Peritoneal dialysis status ([facility])    Stroke ([facility]) 08/15/2017   no residual     Current Outpatient Medications:    acetaminophen (TYLENOL) 500 MG tablet, Take 500 mg by mouth every 6 (six) hours as needed for moderate pain., Disp: , Rfl:    allopurinol (ZYLOPRIM) 100 MG tablet, Take 100 mg by mouth at bedtime. , Disp: , Rfl:    ALPRAZolam (XANAX) 0.5 MG tablet, Take 1-2 tablets 30 minutes prior to MRI, may repeat once as needed. Must have driver. (Patient not taking: Reported on 02/14/2021), Disp: 3 tablet,  Rfl: 0   ALPRAZolam (XANAX) 1 MG tablet, Take 1 mg by mouth 2 (two) times daily as needed for anxiety. , Disp: , Rfl:    atorvastatin (LIPITOR) 40 MG tablet, Take 1 tablet (40 mg total) by mouth daily at 6 PM., Disp: 30 tablet, Rfl: 3   carbidopa-levodopa (SINEMET IR) 25-100 MG tablet, Take 1 tablet by mouth 3 (three) times daily., Disp: 90 tablet, Rfl: 3   gabapentin (NEURONTIN) 100 MG capsule, Take 1 capsule (100 mg total) by mouth 3 (three) times daily., Disp: 90 capsule, Rfl: 11   hydrOXYzine (ATARAX/VISTARIL) 25 MG tablet, Take 25 mg by mouth every 6 (six) hours as needed for itching., Disp: , Rfl:    lanthanum (FOSRENOL) 1000 MG chewable tablet, Chew 2,000 mg by mouth 3 (three) times daily with meals., Disp: , Rfl:    metoprolol succinate (TOPROL-XL) 50 MG 24 hr tablet, Take 50 mg by mouth every evening. Take with or immediately following a meal., Disp: , Rfl:    multivitamin (RENA-VIT) TABS tablet, Take 1 tablet by mouth at bedtime. , Disp: , Rfl:    omeprazole (PRILOSEC) 40 MG capsule, Take 40 mg by mouth in the morning and at bedtime., Disp: , Rfl:  oxyCODONE-acetaminophen (PERCOCET) 5-325 MG tablet, Take 1 tablet by mouth every 6 (six) hours as needed for severe pain., Disp: 6 tablet, Rfl: 0   promethazine (PHENERGAN) 25 MG tablet, Take 1 tablet (25 mg total) by mouth every 8 (eight) hours as needed for nausea or vomiting., Disp: 10 tablet, Rfl: 0   zolpidem (AMBIEN) 10 MG tablet, Take 10 mg by mouth at bedtime., Disp: , Rfl:   Social History   Tobacco Use  Smoking Status Never  Smokeless Tobacco Never    Allergies  Allergen Reactions   Nsaids Other (See Comments)    Cannot take due to Kidney disease/Kidney function    Tolmetin Other (See Comments)    Cannot take due to Kidney Disease   Objective:  There were no vitals filed for this visit. There is no height or weight on file to calculate BMI. Constitutional Well developed. Well nourished.  Vascular Dorsalis pedis  pulses nonpalpable bilaterally. Posterior tibial pulses nonpalpable bilaterally. Capillary refill normal diminished to all digits No cyanosis or clubbing noted. Pedal hair growth not present  Neurologic Normal speech. Oriented to person, place, and time. Protective sensation absent  Dermatologic Wound Location: Right submetatarsal 5 with fat layer exposed.  Probing down to deep tissue not down to bone.  No purulent drainage noted.  Minimal bleeding noted.  No malodor present. Wound Base: Mixed Granular/Fibrotic Peri-wound: Calloused Exudate: Scant/small amount Serosanguinous exudate Wound Measurements: -See below  Orthopedic: No pain to palpation either foot.   Radiographs: None Assessment:   1. Right foot ulcer, with fat layer exposed ([place])   2. Encounter for preoperative examination for general surgical procedure   3. Plantar flexed metatarsal, right   4. Idiopathic peripheral neuropathy    3 views of skeletally mature the right foot: No osteomyelitis noted.  No pathologic fracture noted.  Plantarflexed fifth metatarsal noted. Plan:  Patient was evaluated and treated and all questions answered.  Ulcer right submetatarsal 5 ulceration with fat layer exposed -Debridement as below. -Dressed with Betadine wet-to-dry, DSD. -Continue off-loading with surgical shoe. -Patient is a high risk of partial fifth ray amputation versus foot amputation versus below the knee amputation she states understanding.  She has peripheral neuropathy.  She is not a diabetic -I discussed with the patient in extensive detail she will benefit from floating osteotomy given that the ulcer is stagnant and not improving.  I am worried that if it regresses patient is a high risk of undergoing an amputation.  I discussed this with patient and she agrees with the plan of undergoing floating osteotomy of the fifth metatarsal to take the pressure off.  I discussed my preoperative intraoperative postoperative plan in  extensive detail she states understanding.  Her vascular studies were within normal limits so I feel comfortable proceeding with it.  She is not a diabetic.  Vascular abnormality -ABIs PVRs were reviewed which shows within normal limits.  However she may still be having some microvessel disease  Procedure: Excisional Debridement of Wound~stagnant Tool: Sharp chisel blade/tissue nipper Rationale: Removal of non-viable soft tissue from the wound to promote healing.  Anesthesia: none Pre-Debridement Wound Measurements: 0.5 cm x 0.7 cm x 0.4 cm  Post-Debridement Wound Measurements: 0.7 cm x 0.9 cm x 0.4 cm  Type of Debridement: Sharp Excisional Tissue Removed: Non-viable soft tissue Blood loss: Minimal (<50cc) Depth of Debridement: subcutaneous tissue. Technique: Sharp excisional debridement to bleeding, viable wound base.  Wound Progress: The wound is appears to be about the same measurement. Dressing: Dry,  sterile, compression dressing. Disposition: Patient tolerated procedure well. Patient to return in 1 week for follow-up.  No follow-ups on file.

## 2021-03-24 ENCOUNTER — Telehealth: Payer: Self-pay

## 2021-03-24 NOTE — Telephone Encounter (Signed)
Pearly called to cancel her surgery with Dr. Posey Pronto on 03/31/2021. She stated [facility] is requiring $250.00 up front and she doesn't have that at this time. She will call back when she is ready to reschedule. Notified Dr. Posey Pronto and Caren Griffins with [place] ?

## 2021-03-28 ENCOUNTER — Ambulatory Visit: Payer: Medicare Other | Admitting: Podiatry

## 2021-04-01 ENCOUNTER — Ambulatory Visit: Payer: Medicare Other | Admitting: Podiatry

## 2021-04-01 ENCOUNTER — Other Ambulatory Visit: Payer: Self-pay

## 2021-04-01 DIAGNOSIS — M216X1 Other acquired deformities of right foot: Secondary | ICD-10-CM

## 2021-04-01 DIAGNOSIS — L97512 Non-pressure chronic ulcer of other part of right foot with fat layer exposed: Secondary | ICD-10-CM

## 2021-04-01 DIAGNOSIS — Z01818 Encounter for other preprocedural examination: Secondary | ICD-10-CM

## 2021-04-01 NOTE — Progress Notes (Signed)
?Subjective:  ?Patient ID: Patricia [name], female    DOB: 1975/01/21,  MRN: [id number] ? ?Chief Complaint  ?Patient presents with  ? Wound Check  ? ? ?46 y.o. female presents for wound care.  Patient presents follow-up with a right fifth metatarsal ulceration.  She is not able to undergo surgery because of financial consider ability.  I discussed with her that I can do it in the office at our surgical history.  She is happy to do that. ? ? ?Review of Systems: Negative except as noted in the HPI. Denies N/V/F/Ch. ? ?Past Medical History:  ?Diagnosis Date  ? Acid reflux   ? Anemia 04/2019  ? REQUIRING BLOOD TRANSFUSIONS  ? Anxiety   ? Arthritis   ? CHF (congestive heart failure) ([place])   ? Dyspnea   ? with exertion  ? Dysrhythmia   ? tachycardia  ? ESRD (end stage renal disease) ([place])   ? [place]  ? Facial twitching 11/2020  ? mouth twitch, eyes roll back, neurology started patient on low dose   Sinemet IR.  ? GI bleeding 12/2017  ? Gout   ? Headache(784.0)   ? "related to chemo; sometimes weekly" (09/12/2013)  ? Heart murmur   ? Systolic per Dr Dixie Dials' notes  ? High cholesterol   ? History of blood transfusion "a couple"  ? "related to low counts"  ? Hypertension   ? Iron deficiency anemia   ? "get epogen shots q month" (02/20/2014)  ? MCGN (minimal change glomerulonephritis)   ? "using chemo to tx;  finished my last tx in 12/2013"  ? Neuropathy   ? Peritoneal dialysis status ([place])   ? Stroke ([place]) 08/15/2017  ? no residual   ? ? ?Current Outpatient Medications:  ?  acetaminophen (TYLENOL) 500 MG tablet, Take 500 mg by mouth every 6 (six) hours as needed for moderate pain., Disp: , Rfl:  ?  allopurinol (ZYLOPRIM) 100 MG tablet, Take 100 mg by mouth at bedtime. , Disp: , Rfl:  ?  ALPRAZolam (XANAX) 0.5 MG tablet, Take 1-2 tablets 30 minutes prior to MRI, may repeat once as needed. Must have driver. (Patient not taking: Reported on 02/14/2021), Disp: 3 tablet, Rfl: 0 ?  ALPRAZolam (XANAX) 1 MG  tablet, Take 1 mg by mouth 2 (two) times daily as needed for anxiety. , Disp: , Rfl:  ?  atorvastatin (LIPITOR) 40 MG tablet, Take 1 tablet (40 mg total) by mouth daily at 6 PM., Disp: 30 tablet, Rfl: 3 ?  carbidopa-levodopa (SINEMET IR) 25-100 MG tablet, Take 1 tablet by mouth 3 (three) times daily., Disp: 90 tablet, Rfl: 3 ?  gabapentin (NEURONTIN) 100 MG capsule, Take 1 capsule (100 mg total) by mouth 3 (three) times daily., Disp: 90 capsule, Rfl: 11 ?  hydrOXYzine (ATARAX/VISTARIL) 25 MG tablet, Take 25 mg by mouth every 6 (six) hours as needed for itching., Disp: , Rfl:  ?  lanthanum (FOSRENOL) 1000 MG chewable tablet, Chew 2,000 mg by mouth 3 (three) times daily with meals., Disp: , Rfl:  ?  metoprolol succinate (TOPROL-XL) 50 MG 24 hr tablet, Take 50 mg by mouth every evening. Take with or immediately following a meal., Disp: , Rfl:  ?  multivitamin (RENA-VIT) TABS tablet, Take 1 tablet by mouth at bedtime. , Disp: , Rfl:  ?  omeprazole (PRILOSEC) 40 MG capsule, Take 40 mg by mouth in the morning and at bedtime., Disp: , Rfl:  ?  oxyCODONE-acetaminophen (PERCOCET) 5-325 MG tablet,  Take 1 tablet by mouth every 6 (six) hours as needed for severe pain., Disp: 6 tablet, Rfl: 0 ?  promethazine (PHENERGAN) 25 MG tablet, Take 1 tablet (25 mg total) by mouth every 8 (eight) hours as needed for nausea or vomiting., Disp: 10 tablet, Rfl: 0 ?  zolpidem (AMBIEN) 10 MG tablet, Take 10 mg by mouth at bedtime., Disp: , Rfl:  ? ?Social History  ? ?Tobacco Use  ?Smoking Status Never  ?Smokeless Tobacco Never  ? ? ?Allergies  ?Allergen Reactions  ? Nsaids Other (See Comments)  ?  Cannot take due to Kidney disease/Kidney function ?  ? Tolmetin Other (See Comments)  ?  Cannot take due to Kidney Disease  ? ?Objective:  ?There were no vitals filed for this visit. ?There is no height or weight on file to calculate BMI. ?Constitutional Well developed. ?Well nourished.  ?Vascular Dorsalis pedis pulses nonpalpable  bilaterally. ?Posterior tibial pulses nonpalpable bilaterally. ?Capillary refill normal diminished to all digits ?No cyanosis or clubbing noted. ?Pedal hair growth not present  ?Neurologic Normal speech. ?Oriented to person, place, and time. ?Protective sensation absent  ?Dermatologic Wound Location: Right submetatarsal 5 with fat layer exposed.  Probing down to deep tissue not down to bone.  No purulent drainage noted.  Minimal bleeding noted.  No malodor present. ?Wound Base: Mixed Granular/Fibrotic ?Peri-wound: Calloused ?Exudate: Scant/small amount Serosanguinous exudate ?Wound Measurements: ?-See below  ?Orthopedic: No pain to palpation either foot.  ? ?Radiographs: None ?Assessment:  ? ?1. Right foot ulcer, with fat layer exposed ([place])   ?2. Encounter for preoperative examination for general surgical procedure   ?3. Plantar flexed metatarsal, right   ? ? ?3 views of skeletally mature the right foot: No osteomyelitis noted.  No pathologic fracture noted.  Plantarflexed fifth metatarsal noted. ?Plan:  ?Patient was evaluated and treated and all questions answered. ? ?Ulcer right submetatarsal 5 ulceration with fat layer exposed ?-Debridement as below. ?-Dressed with Betadine wet-to-dry, DSD. ?-Continue off-loading with surgical shoe. ?-Patient is a high risk of partial fifth ray amputation versus foot amputation versus below the knee amputation she states understanding.  She has peripheral neuropathy.  She is not a diabetic ?-I discussed with the patient in extensive detail she will benefit from floating osteotomy given that the ulcer is stagnant and not improving.  I am worried that if it regresses patient is a high risk of undergoing an amputation.  I discussed this with patient and she agrees with the plan of undergoing floating osteotomy of the fifth metatarsal to take the pressure off.  I discussed my preoperative intraoperative postoperative plan in extensive detail she states understanding.  Her vascular  studies were within normal limits so I feel comfortable proceeding with it.  She is not a diabetic. ? ?Vascular abnormality ?-ABIs PVRs were reviewed which shows within normal limits.  However she may still be having some microvessel disease ? ?Procedure: Excisional Debridement of Wound~stagnant ?Tool: Sharp chisel blade/tissue nipper ?Rationale: Removal of non-viable soft tissue from the wound to promote healing.  ?Anesthesia: none ?Pre-Debridement Wound Measurements: 0.5 cm x 0.7 cm x 0.4 cm  ?Post-Debridement Wound Measurements: 0.7 cm x 0.9 cm x 0.4 cm  ?Type of Debridement: Sharp Excisional ?Tissue Removed: Non-viable soft tissue ?Blood loss: Minimal (<50cc) ?Depth of Debridement: subcutaneous tissue. ?Technique: Sharp excisional debridement to bleeding, viable wound base.  ?Wound Progress: The wound is appears to be about the same measurement. ?Dressing: Dry, sterile, compression dressing. ?Disposition: Patient tolerated procedure well. Patient to  return in 1 week for follow-up. ? ?No follow-ups on file. ? ?  ?

## 2021-04-07 ENCOUNTER — Other Ambulatory Visit (HOSPITAL_COMMUNITY): Payer: Self-pay | Admitting: Vascular Surgery

## 2021-04-07 ENCOUNTER — Other Ambulatory Visit: Payer: Self-pay | Admitting: *Deleted

## 2021-04-07 ENCOUNTER — Ambulatory Visit (HOSPITAL_COMMUNITY)
Admission: RE | Admit: 2021-04-07 | Discharge: 2021-04-07 | Disposition: A | Discharge status: Home / Self Care | Payer: Medicare Other | Source: Ambulatory Visit | Attending: Vascular Surgery | Admitting: Vascular Surgery

## 2021-04-07 ENCOUNTER — Ambulatory Visit (INDEPENDENT_AMBULATORY_CARE_PROVIDER_SITE_OTHER): Payer: Medicare Other | Admitting: Vascular Surgery

## 2021-04-07 ENCOUNTER — Encounter: Payer: Self-pay | Admitting: Vascular Surgery

## 2021-04-07 VITALS — BP 156/93 | HR 69 | Temp 97.9°F | Resp 16 | Ht 67.0 in | Wt 235.0 lb

## 2021-04-07 DIAGNOSIS — N186 End stage renal disease: Secondary | ICD-10-CM

## 2021-04-07 DIAGNOSIS — Z992 Dependence on renal dialysis: Secondary | ICD-10-CM

## 2021-04-07 DIAGNOSIS — Z09 Encounter for follow-up examination after completed treatment for conditions other than malignant neoplasm: Secondary | ICD-10-CM

## 2021-04-07 NOTE — Progress Notes (Signed)
?Office Note  ? ? ?HPI: Patricia [name] is a Right handed 46 y.o. (11-08-1975) female with kidney disease currently being dialyzed Tuesday Thursday Saturday, who presents in follow-up status post right-sided brachiocephalic fistula creation.  The operation, Thayer Ohm has been doing well.  At the last couple of weeks, she has appreciated some numbness and tingling in the fingertips that waxes and wanes.  This is especially noted when she uses her phone or use the hand for significant amount of time.  This does not wake her up at night, there is no pain associated.  She continues to dialyze using a left-sided tunneled HD catheter.  She denies wound healing concerns. ? ?The pt is on a statin for cholesterol management.  ?The pt is not on a daily aspirin.   Other AC:   ?The pt is  on medications for hypertension.   ?The pt is not diabetic. ?Tobacco hx:  never ? ?Past Medical History:  ?Diagnosis Date  ? Acid reflux   ? Anemia 04/2019  ? REQUIRING BLOOD TRANSFUSIONS  ? Anxiety   ? Arthritis   ? CHF (congestive heart failure) ([facility])   ? Dyspnea   ? with exertion  ? Dysrhythmia   ? tachycardia  ? ESRD (end stage renal disease) ([facility])   ? [facility] [place]  ? Facial twitching 11/2020  ? mouth twitch, eyes roll back, neurology started patient on low dose   Sinemet IR.  ? GI bleeding 12/2017  ? Gout   ? Headache(784.0)   ? "related to chemo; sometimes weekly" (09/12/2013)  ? Heart murmur   ? Systolic per Dr Orpah Cobb' notes  ? High cholesterol   ? History of blood transfusion "a couple"  ? "related to low counts"  ? Hypertension   ? Iron deficiency anemia   ? "get epogen shots q month" (02/20/2014)  ? MCGN (minimal change glomerulonephritis)   ? "using chemo to tx;  finished my last tx in 12/2013"  ? Neuropathy   ? Peritoneal dialysis status ([facility])   ? Stroke ([facility]) 08/15/2017  ? no residual   ? ? ?Past Surgical History:  ?Procedure Laterality Date  ? ABDOMINAL HYSTERECTOMY  2010  ? "laparoscopic"  ? ANKLE FRACTURE SURGERY  Right 1994  ? AV FISTULA PLACEMENT Left 04/14/2016  ? Procedure: ARTERIOVENOUS (AV) FISTULA CREATION;  Surgeon: Chuck Hint, MD;  Location: [facility] OR;  Service: Vascular;  Laterality: Left;  ? AV FISTULA PLACEMENT Left 08/09/2017  ? Procedure: INSERTION OF ARTERIOVENOUS (AV) GORE-TEX GRAFT LEFT ARM;  Surgeon: Sherren Kerns, MD;  [facility] OR;  Service: Vascular;  Laterality: Left;  ? AV FISTULA PLACEMENT Right 03/15/2020  ? Procedure: RIGHT ARM ARTERIOVENOUS (AV) FISTULA CREATION 1st Stage Basilic Transposition;  Surgeon: Nada Libman, MD;  Location: [facility] OR;  Service: Vascular;  Laterality: Right;  ? AV FISTULA PLACEMENT Right 02/24/2021  ? Procedure: RIGHT ARM ARTERIOVENOUS  FISTULA CREATION;  Surgeon: Victorino Sparrow, MD;  Location: [facility] OR;  Service: Vascular;  Laterality: Right;  regional block  ? AVGG REMOVAL Left 08/11/2017  ? Procedure: REMOVAL OF ARTERIOVENOUS GORETEX GRAFT (AVGG);  Surgeon: Nada Libman, MD;  Location: [facility] OR;  Service: Vascular;  Laterality: Left;  ? BASCILIC VEIN TRANSPOSITION Right 05/17/2020  ? Procedure: RIGHT UPPER EXTREMITY SECOND STAGE BASCILIC VEIN TRANSPOSITION;  Surgeon: Nada Libman, MD;  Location: [facility] OR;  Service: Vascular;  Laterality: Right;  ? BIOPSY  09/03/2017  ? Procedure: BIOPSY;  Surgeon: Kathi Der, MD;  Location: [place] ENDOSCOPY;  Service: Gastroenterology;;  ? BIOPSY  12/24/2017  ? Procedure: BIOPSY;  Surgeon: Irving Copas., MD;  Location: [place];  Service: Gastroenterology;;  ? CARDIAC CATHETERIZATION  2000's  ? COLONOSCOPY WITH PROPOFOL N/A 09/04/2017  ? Procedure: COLONOSCOPY WITH PROPOFOL;  Surgeon: Ronnette Juniper, MD;  Location: [place];  Service: Gastroenterology;  Laterality: N/A;  ? ENTEROSCOPY N/A 12/24/2017  ? Procedure: ENTEROSCOPY;  Surgeon: Rush Landmark Telford Nab., MD;  Location: [place];  Service: Gastroenterology;  Laterality: N/A;  ? ESOPHAGOGASTRODUODENOSCOPY    ? ESOPHAGOGASTRODUODENOSCOPY (EGD) WITH PROPOFOL  Left 06/04/2017  ? Procedure: ESOPHAGOGASTRODUODENOSCOPY (EGD) WITH PROPOFOL;  Surgeon: Arta Silence, MD;  Location[facility] ENDOSCOPY;  Service: Endoscopy;  Laterality: Left;  ? ESOPHAGOGASTRODUODENOSCOPY (EGD) WITH PROPOFOL N/A 09/03/2017  ? Procedure: ESOPHAGOGASTRODUODENOSCOPY (EGD) WITH PROPOFOL;  Surgeon: Otis Brace, MD;  Location: [facility] ENDOSCOPY;  Service: Gastroenterology;  Laterality: N/A;  ? FISTULA SUPERFICIALIZATION Left 04/02/2017  ? Procedure: FISTULA SUPERFICIALIZATION LEFT ARM;  Surgeon: Serafina Mitchell, MD;  Location: [place];  Service: Vascular;  Laterality: Left;  ? FISTULOGRAM Left 04/07/2016  ? Procedure: FISTULOGRAM;  Surgeon: Waynetta Sandy, MD;  Location: [place];  Service: Vascular;  Laterality: Left;  ? FRACTURE SURGERY    ? right ankle  ? GIVENS CAPSULE STUDY N/A 10/29/2017  ? Procedure: GIVENS CAPSULE STUDY;  Surgeon: Wilford Corner, MD;  Location: [facility] ENDOSCOPY;  Service: Endoscopy;  Laterality: N/A;  ? GIVENS CAPSULE STUDY N/A 12/24/2017  ? Procedure: GIVENS CAPSULE STUDY;  Surgeon: Irving Copas., MD;  Location: [place];  Service: Gastroenterology;  Laterality: N/A;  ? GIVENS CAPSULE STUDY N/A 05/22/2018  ? Procedure: GIVENS CAPSULE STUDY;  Surgeon: Irving Copas., MD;  Location: [place];  Service: Gastroenterology;  Laterality: N/A;  ? INSERTION OF DIALYSIS CATHETER Right 04/07/2016  ? Procedure: INSERTION OF DIALYSIS CATHETER;  Surgeon: Waynetta Sandy, MD;  Location: [place];  Service: Vascular;  Laterality: Right;  ? INSERTION OF DIALYSIS CATHETER Right 04/04/2017  ? Procedure: INSERTION OF DIALYSIS CATHETER;  Surgeon: Waynetta Sandy, MD;  Location: [place];  Service: Vascular;  Laterality: Right;  ? IR FLUORO GUIDE CV LINE LEFT  03/13/2020  ? IR US GUIDE VASC ACCESS LEFT  03/13/2020  ? LEFT HEART CATH AND CORONARY ANGIOGRAPHY N/A 05/12/2019  ? Procedure: LEFT HEART CATH AND CORONARY ANGIOGRAPHY;  Surgeon: Dixie Dials, MD;  Location: [place] CV LAB;  Service: Cardiovascular;  Laterality: N/A;  ? LIGATION OF ARTERIOVENOUS  FISTULA Left 04/14/2016  ? Procedure: LIGATION OF ARTERIOVENOUS  FISTULA;  Surgeon: Angelia Mould, MD;  Location: [place];  Service: Vascular;  Laterality: Left;  ? LIGATION OF COMPETING BRANCHES OF ARTERIOVENOUS FISTULA Right 05/17/2020  ? Procedure: LIGATION OF COMPETING BRANCHES OF RIGHT ARM ARTERIOVENOUS FISTULA;  Surgeon: Serafina Mitchell, MD;  Location: [facility] OR;  Service: Vascular;  Laterality: Right;  ? PATCH ANGIOPLASTY Left 06/19/2016  ? Procedure: PATCH ANGIOPLASTY LEFT ARTERIOVENOUS FISTULA;  Surgeon: Angelia Mould, MD;  Location: [place];  Service: Vascular;  Laterality: Left;  ? POLYPECTOMY  09/04/2017  ? Procedure: POLYPECTOMY;  Surgeon: Ronnette Juniper, MD;  Location: [facility] ENDOSCOPY;  Service: Gastroenterology;;  ? REVISON OF ARTERIOVENOUS FISTULA Left 06/19/2016  ? Procedure: REVISION SUPERFICIALIZATION OF BRACHIOCEPHALIC ARTERIOVENOUS FISTULA;  Surgeon: Angelia Mould, MD;  Location: [place];  Service: Vascular;  Laterality: Left;  ? UPPER EXTREMITY VENOGRAPHY Bilateral 02/19/2021  ? Procedure: UPPER EXTREMITY VENOGRAPHY;  Surgeon: Broadus John, MD;  [facility]  INVASIVE CV LAB;  Service: Cardiovascular;  Laterality: Bilateral;  ? ? ?Social History  ? ?Socioeconomic History  ? Marital status: Single  ?  Spouse name: Not on file  ? Number of children: 3  ? Years of education: 79  ? Highest education level: Associate degree: academic program  ?Occupational History  ? Occupation: disabled  ?Tobacco Use  ? Smoking status: Never  ? Smokeless tobacco: Never  ?Vaping Use  ? Vaping Use: Never used  ?Substance and Sexual Activity  ? Alcohol use: No  ? Drug use: Never  ? Sexual activity: Not Currently  ?  Birth control/protection: Surgical  ?Other Topics Concern  ? Not on file  ?Social History Narrative  ? Not on file  ? ?Social Determinants of Health  ? ?Financial Resource Strain: Not on file  ?Food  Insecurity: Not on file  ?Transportation Needs: Not on file  ?Physical Activity: Not on file  ?Stress: Not on file  ?Social Connections: Not on file  ?Intimate Partner Violence: Not on file  ? ? ?Family History

## 2021-04-08 ENCOUNTER — Other Ambulatory Visit: Payer: Self-pay

## 2021-04-09 ENCOUNTER — Encounter: Payer: Medicare Other | Admitting: Podiatry

## 2021-04-11 ENCOUNTER — Encounter (HOSPITAL_COMMUNITY): Payer: Medicare Other

## 2021-04-11 ENCOUNTER — Other Ambulatory Visit: Payer: Self-pay

## 2021-04-11 ENCOUNTER — Encounter (HOSPITAL_COMMUNITY): Payer: Self-pay | Admitting: Vascular Surgery

## 2021-04-11 ENCOUNTER — Encounter: Payer: Medicare Other | Admitting: Vascular Surgery

## 2021-04-11 NOTE — Progress Notes (Signed)
Ms Godby denies chest pain or shortness of breath. Patient denies having any s/s of Covid in her household.  Patient denies any known exposure to Covid.  ? ?Ms Monrreal has a facial twitch, uncontrolled movement of the face.  Ms Tison saw a neurologist  who started  her on cambidopa-levodopa, patient states that it has not helped. ? ?PCP is [name], PA-C. ? ?I instructed Ms Lenhardt to shower with antibacteria soap.  Do not shave. No nail polish, artificial or acrylic nails. Wear clean clothes, brush your teeth. ?Glasses, contact lens,dentures or partials may not be worn in the OR. If you need to wear them, please bring a case for glasses, do not wear contacts or bring a case, the hospital does not have contact cases, dentures or partials will have to be removed , make sure they are clean, we will provide a denture cup to put them in. You will need some one to drive you home and a responsible person over the age of 35 to stay with you for the first 24 hours after surgery.  ?

## 2021-04-13 NOTE — Anesthesia Preprocedure Evaluation (Addendum)
Anesthesia Evaluation  ?Patient identified by MRN, date of birth, ID band ?Patient awake ? ? ? ?Reviewed: ?Allergy & Precautions, NPO status , Patient's Chart, lab work & pertinent test results, reviewed documented beta blocker date and time  ? ?History of Anesthesia Complications ?Negative for: history of anesthetic complications ? ?Airway ?Mallampati: III ? ?TM Distance: >3 FB ?Neck ROM: Full ? ? ? Dental ?no notable dental hx. ?(+) Dental Advisory Given ?  ?Pulmonary ?neg pulmonary ROS,  ?  ?Pulmonary exam normal ? ? ? ? ? ? ? Cardiovascular ?hypertension, Pt. on home beta blockers and Pt. on medications ?+CHF  ?Normal cardiovascular exam+ dysrhythmias Supra Ventricular Tachycardia  ? ? ?'21 TTE - EF 55 to 60%. Grade I diastolic dysfunction (impaired relaxation).  Trivial MR ? ?  ?Neuro/Psych ? Headaches, PSYCHIATRIC DISORDERS Anxiety  Neuromuscular disease negative psych ROS  ? GI/Hepatic ?Neg liver ROS, GERD  Medicated,  ?Endo/Other  ? ?Obesity ? ? Renal/GU ?ESRF and DialysisRenal disease  ?negative genitourinary ?  ?Musculoskeletal ? ?(+) Arthritis ,  ? Abdominal ?  ?Peds ?negative pediatric ROS ?(+)  Hematology ? ?(+) Blood dyscrasia, anemia ,   ?Anesthesia Other Findings ? ? Reproductive/Obstetrics ?negative OB ROS ? ?  ? ? ? ? ? ? ? ? ? ? ? ? ? ?  ?  ? ? ? ? ? ? ? ?Anesthesia Physical ? ?Anesthesia Plan ? ?ASA: 3 ? ?Anesthesia Plan: Regional and MAC  ? ?Post-op Pain Management: Regional block* and Tylenol PO (pre-op)*  ? ?Induction:  ? ?PONV Risk Score and Plan: 2 and Propofol infusion and Ondansetron ? ?Airway Management Planned: Natural Airway and Simple Face Mask ? ?Additional Equipment: None ? ?Intra-op Plan:  ? ?Post-operative Plan:  ? ?Informed Consent: I have reviewed the patients History and Physical, chart, labs and discussed the procedure including the risks, benefits and alternatives for the proposed anesthesia with the patient or authorized representative who  has indicated his/her understanding and acceptance.  ? ? ? ?Dental advisory given ? ?Plan Discussed with: Anesthesiologist and CRNA ? ?Anesthesia Plan Comments:   ? ? ? ? ? ?Anesthesia Quick Evaluation ? ?

## 2021-04-14 ENCOUNTER — Ambulatory Visit (HOSPITAL_COMMUNITY)
Admission: RE | Admit: 2021-04-14 | Discharge: 2021-04-14 | Disposition: A | Discharge status: Home / Self Care | Payer: Medicare Other | Attending: Vascular Surgery | Admitting: Vascular Surgery

## 2021-04-14 ENCOUNTER — Ambulatory Visit (HOSPITAL_COMMUNITY): Payer: Medicare Other | Admitting: Anesthesiology

## 2021-04-14 ENCOUNTER — Ambulatory Visit (HOSPITAL_COMMUNITY): Payer: Medicare Other

## 2021-04-14 ENCOUNTER — Encounter (HOSPITAL_COMMUNITY): Payer: Self-pay | Admitting: Vascular Surgery

## 2021-04-14 ENCOUNTER — Encounter (HOSPITAL_COMMUNITY)
Admission: RE | Disposition: A | Discharge status: Home / Self Care | Payer: Self-pay | Source: Home / Self Care | Attending: Vascular Surgery

## 2021-04-14 ENCOUNTER — Other Ambulatory Visit: Payer: Self-pay

## 2021-04-14 ENCOUNTER — Ambulatory Visit (HOSPITAL_BASED_OUTPATIENT_CLINIC_OR_DEPARTMENT_OTHER): Payer: Medicare Other | Admitting: Anesthesiology

## 2021-04-14 DIAGNOSIS — T82898A Other specified complication of vascular prosthetic devices, implants and grafts, initial encounter: Secondary | ICD-10-CM | POA: Diagnosis not present

## 2021-04-14 DIAGNOSIS — N186 End stage renal disease: Secondary | ICD-10-CM | POA: Insufficient documentation

## 2021-04-14 DIAGNOSIS — I509 Heart failure, unspecified: Secondary | ICD-10-CM | POA: Insufficient documentation

## 2021-04-14 DIAGNOSIS — Z992 Dependence on renal dialysis: Secondary | ICD-10-CM | POA: Insufficient documentation

## 2021-04-14 DIAGNOSIS — I132 Hypertensive heart and chronic kidney disease with heart failure and with stage 5 chronic kidney disease, or end stage renal disease: Secondary | ICD-10-CM | POA: Diagnosis present

## 2021-04-14 DIAGNOSIS — Z79899 Other long term (current) drug therapy: Secondary | ICD-10-CM | POA: Insufficient documentation

## 2021-04-14 DIAGNOSIS — N185 Chronic kidney disease, stage 5: Secondary | ICD-10-CM | POA: Diagnosis not present

## 2021-04-14 DIAGNOSIS — I5032 Chronic diastolic (congestive) heart failure: Secondary | ICD-10-CM | POA: Diagnosis not present

## 2021-04-14 DIAGNOSIS — E1142 Type 2 diabetes mellitus with diabetic polyneuropathy: Secondary | ICD-10-CM | POA: Diagnosis not present

## 2021-04-14 HISTORY — PX: FISTULA SUPERFICIALIZATION: SHX6341

## 2021-04-14 LAB — POCT I-STAT, CHEM 8
BUN: 57 mg/dL — ABNORMAL HIGH (ref 6–20)
Calcium, Ion: 1.13 mmol/L — ABNORMAL LOW (ref 1.15–1.40)
Chloride: 104 mmol/L (ref 98–111)
Creatinine, Ser: 13.1 mg/dL — ABNORMAL HIGH (ref 0.44–1.00)
Glucose, Bld: 108 mg/dL — ABNORMAL HIGH (ref 70–99)
HCT: 34 % — ABNORMAL LOW (ref 36.0–46.0)
Hemoglobin: 11.6 g/dL — ABNORMAL LOW (ref 12.0–15.0)
Potassium: 4.5 mmol/L (ref 3.5–5.1)
Sodium: 140 mmol/L (ref 135–145)
TCO2: 25 mmol/L (ref 22–32)

## 2021-04-14 SURGERY — FISTULA SUPERFICIALIZATION
Anesthesia: Monitor Anesthesia Care | Site: Arm Upper | Laterality: Right

## 2021-04-14 MED ORDER — MIDAZOLAM HCL 2 MG/2ML IJ SOLN
INTRAMUSCULAR | Status: AC
  Filled 2021-04-14: qty 2

## 2021-04-14 MED ORDER — FENTANYL CITRATE (PF) 250 MCG/5ML IJ SOLN
INTRAMUSCULAR | Status: DC | PRN
  Administered 2021-04-14: 50 ug via INTRAVENOUS

## 2021-04-14 MED ORDER — DIPHENHYDRAMINE HCL 50 MG/ML IJ SOLN
INTRAMUSCULAR | Status: AC
  Filled 2021-04-14: qty 1

## 2021-04-14 MED ORDER — MIDAZOLAM HCL 2 MG/2ML IJ SOLN
INTRAMUSCULAR | Status: DC | PRN
  Administered 2021-04-14: 2 mg via INTRAVENOUS

## 2021-04-14 MED ORDER — ORAL CARE MOUTH RINSE: 15.0000 mL | Freq: Once | OROMUCOSAL | Status: AC

## 2021-04-14 MED ORDER — DIPHENHYDRAMINE HCL 50 MG/ML IJ SOLN
12.5000 mg | Freq: Once | INTRAMUSCULAR | Status: AC
  Administered 2021-04-14: 12.5 mg via INTRAVENOUS

## 2021-04-14 MED ORDER — PHENYLEPHRINE HCL-NACL 20-0.9 MG/250ML-% IV SOLN
INTRAVENOUS | Status: DC | PRN
  Administered 2021-04-14: 25 ug/min via INTRAVENOUS

## 2021-04-14 MED ORDER — AMISULPRIDE (ANTIEMETIC) 5 MG/2ML IV SOLN
10.0000 mg | Freq: Once | INTRAVENOUS | Status: DC | PRN
Start: 2021-04-14 — End: 2021-04-16

## 2021-04-14 MED ORDER — HEPARIN 6000 UNIT IRRIGATION SOLUTION
Status: DC | PRN
  Administered 2021-04-14: 1

## 2021-04-14 MED ORDER — PROPOFOL 10 MG/ML IV BOLUS
INTRAVENOUS | Status: DC | PRN
  Administered 2021-04-14: 50 mg via INTRAVENOUS

## 2021-04-14 MED ORDER — FENTANYL CITRATE (PF) 100 MCG/2ML IJ SOLN: 25.0000 ug | INTRAMUSCULAR | Status: DC | PRN

## 2021-04-14 MED ORDER — FENTANYL CITRATE (PF) 250 MCG/5ML IJ SOLN
INTRAMUSCULAR | Status: AC
  Filled 2021-04-14: qty 5

## 2021-04-14 MED ORDER — LIDOCAINE 2% (20 MG/ML) 5 ML SYRINGE
INTRAMUSCULAR | Status: DC | PRN
Start: 2021-04-14 — End: 2021-04-14
  Administered 2021-04-14: 80 mg via INTRAVENOUS

## 2021-04-14 MED ORDER — SODIUM CHLORIDE 0.9 % IV SOLN: INTRAVENOUS | Status: DC

## 2021-04-14 MED ORDER — CHLORHEXIDINE GLUCONATE 4 % EX LIQD: 60.0000 mL | Freq: Once | CUTANEOUS | Status: DC

## 2021-04-14 MED ORDER — CEFAZOLIN SODIUM-DEXTROSE 2-4 GM/100ML-% IV SOLN
2.0000 g | INTRAVENOUS | Status: AC
  Administered 2021-04-14: 2 g via INTRAVENOUS
  Filled 2021-04-14: qty 100

## 2021-04-14 MED ORDER — OXYCODONE-ACETAMINOPHEN 5-325 MG PO TABS: 1.0000 | ORAL_TABLET | Freq: Four times a day (QID) | ORAL | 0 refills | Status: DC | PRN

## 2021-04-14 MED ORDER — CHLORHEXIDINE GLUCONATE 0.12 % MT SOLN
15.0000 mL | Freq: Once | OROMUCOSAL | Status: AC
  Administered 2021-04-14: 15 mL via OROMUCOSAL
  Filled 2021-04-14: qty 15

## 2021-04-14 MED ORDER — PROPOFOL 500 MG/50ML IV EMUL
INTRAVENOUS | Status: DC | PRN
  Administered 2021-04-14: 75 ug/kg/min via INTRAVENOUS

## 2021-04-14 MED ORDER — 0.9 % SODIUM CHLORIDE (POUR BTL) OPTIME
TOPICAL | Status: DC | PRN
Start: 2021-04-14 — End: 2021-04-14
  Administered 2021-04-14: 1000 mL

## 2021-04-14 MED ORDER — ONDANSETRON HCL 4 MG/2ML IJ SOLN
INTRAMUSCULAR | Status: DC | PRN
  Administered 2021-04-14: 4 mg via INTRAVENOUS

## 2021-04-14 MED ORDER — ACETAMINOPHEN 500 MG PO TABS
1000.0000 mg | ORAL_TABLET | Freq: Once | ORAL | Status: AC
  Administered 2021-04-14: 1000 mg via ORAL
  Filled 2021-04-14: qty 2

## 2021-04-14 SURGICAL SUPPLY — 35 items
ARMBAND PINK RESTRICT EXTREMIT (MISCELLANEOUS) ×2 IMPLANT
BAG COUNTER SPONGE SURGICOUNT (BAG) ×2 IMPLANT
BNDG ELASTIC 6X10 VLCR STRL LF (GAUZE/BANDAGES/DRESSINGS) ×1 IMPLANT
CANISTER SUCT 3000ML PPV (MISCELLANEOUS) ×2 IMPLANT
CLIP LIGATING EXTRA MED SLVR (CLIP) ×2 IMPLANT
CLIP LIGATING EXTRA SM BLUE (MISCELLANEOUS) ×2 IMPLANT
CLIP VESOCCLUDE MED 6/CT (CLIP) ×3 IMPLANT
COVER PROBE W GEL 5X96 (DRAPES) ×1 IMPLANT
DERMABOND ADHESIVE PROPEN (GAUZE/BANDAGES/DRESSINGS) ×1
DERMABOND ADVANCED (GAUZE/BANDAGES/DRESSINGS) ×1
DERMABOND ADVANCED .7 DNX12 (GAUZE/BANDAGES/DRESSINGS) ×1 IMPLANT
DERMABOND ADVANCED .7 DNX6 (GAUZE/BANDAGES/DRESSINGS) IMPLANT
ELECT REM PT RETURN 9FT ADLT (ELECTROSURGICAL) ×2
ELECTRODE REM PT RTRN 9FT ADLT (ELECTROSURGICAL) ×1 IMPLANT
GLOVE SRG 8 PF TXTR STRL LF DI (GLOVE) ×2 IMPLANT
GLOVE SURG POLYISO LF SZ8 (GLOVE) IMPLANT
GLOVE SURG UNDER POLY LF SZ8 (GLOVE) ×1
GOWN STRL REUS W/ TWL LRG LVL3 (GOWN DISPOSABLE) ×2 IMPLANT
GOWN STRL REUS W/TWL 2XL LVL3 (GOWN DISPOSABLE) ×2 IMPLANT
GOWN STRL REUS W/TWL LRG LVL3 (GOWN DISPOSABLE) ×2
KIT BASIN OR (CUSTOM PROCEDURE TRAY) ×2 IMPLANT
KIT TURNOVER KIT B (KITS) ×2 IMPLANT
NS IRRIG 1000ML POUR BTL (IV SOLUTION) ×2 IMPLANT
PACK CV ACCESS (CUSTOM PROCEDURE TRAY) ×2 IMPLANT
PAD ARMBOARD 7.5X6 YLW CONV (MISCELLANEOUS) ×4 IMPLANT
SLING ARM FOAM STRAP LRG (SOFTGOODS) IMPLANT
SLING ARM FOAM STRAP MED (SOFTGOODS) IMPLANT
SUT MNCRL AB 4-0 PS2 18 (SUTURE) ×3 IMPLANT
SUT PROLENE 6 0 BV (SUTURE) ×2 IMPLANT
SUT PROLENE 7 0 BV 1 (SUTURE) ×2 IMPLANT
SUT VIC AB 3-0 SH 27 (SUTURE) ×3
SUT VIC AB 3-0 SH 27X BRD (SUTURE) ×2 IMPLANT
TOWEL GREEN STERILE (TOWEL DISPOSABLE) ×2 IMPLANT
UNDERPAD 30X36 HEAVY ABSORB (UNDERPADS AND DIAPERS) ×2 IMPLANT
WATER STERILE IRR 1000ML POUR (IV SOLUTION) ×2 IMPLANT

## 2021-04-14 NOTE — Anesthesia Postprocedure Evaluation (Signed)
Anesthesia Post Note ? ?Patient: Patricia [name] ? ?Procedure(s) Performed: RIGHT ARM FISTULA SUPERFICIALIZATION (Right: Arm Upper) ? ?  ? ?Patient location during evaluation: PACU ?Anesthesia Type: Regional ?Level of consciousness: awake and alert ?Pain management: pain level controlled ?Vital Signs Assessment: post-procedure vital signs reviewed and stable ?Respiratory status: spontaneous breathing and respiratory function stable ?Cardiovascular status: stable ?Postop Assessment: no apparent nausea or vomiting ?Anesthetic complications: no ? ? ?No notable events documented. ? ?Last Vitals:  ?Vitals:  ? 04/14/21 0924 04/14/21 0939  ?BP: 134/74 135/81  ?Pulse: 75 73  ?Resp: (!) 24 (!) 25  ?Temp:  36.7 ?C  ?SpO2: 96% 96%  ?  ?Last Pain:  ?Vitals:  ? 04/14/21 0939  ?TempSrc:   ?PainSc: 0-No pain  ? ? ?  ?  ?  ?  ?  ?  ? ?Alima Naser DANIEL ? ? ? ? ?

## 2021-04-14 NOTE — Transfer of Care (Signed)
Immediate Anesthesia Transfer of Care Note ? ?Patient: Patricia [name] ? ?Procedure(s) Performed: RIGHT ARM FISTULA SUPERFICIALIZATION (Right: Arm Upper) ? ?Patient Location: PACU ? ?Anesthesia Type:MAC combined with regional for post-op pain ? ?Level of Consciousness: awake, alert  and oriented ? ?Airway & Oxygen Therapy: Patient Spontanous Breathing and Patient connected to nasal cannula oxygen ? ?Post-op Assessment: Report given to RN and Post -op Vital signs reviewed and stable ? ?Post vital signs: Reviewed and stable ? ?Last Vitals:  ?Vitals Value Taken Time  ?BP 125/78 04/14/21 0909  ?Temp    ?Pulse 77 04/14/21 0913  ?Resp 20 04/14/21 0913  ?SpO2 98 % 04/14/21 0913  ?Vitals shown include unvalidated device data. ? ?Last Pain:  ?Vitals:  ? 04/14/21 0627  ?TempSrc:   ?PainSc: 0-No pain  ?   ? ?Patients Stated Pain Goal: 1 (04/14/21 5686) ? ?Complications: No notable events documented. ?

## 2021-04-14 NOTE — Progress Notes (Signed)
Orthopedic Tech Progress Note ?Patient Details:  ?Patricia [name] ?Jul 24, 1975 ?[id number] ? ?PACU RN called requesting an ARM SLING for patient  ? ?Ortho Devices ?Type of Ortho Device: Arm sling ?Ortho Device/Splint Interventions: Ordered ?  ?Post Interventions ?Patient Tolerated: Well ?Instructions Provided: Care of device ? ?Janit Pagan ?04/14/2021, 9:24 AM ? ?

## 2021-04-14 NOTE — Discharge Instructions (Signed)
? ?[facility] [place] ? ?Discharge Instructions ? ?AV Fistula or Graft Surgery for Dialysis Access ? ?Please refer to the following instructions for your post-procedure care. Your surgeon or physician assistant will discuss any changes with you. ? ?Activity ? ?You may drive the day following your surgery, if you are comfortable and no longer taking prescription pain medication. Resume full activity as the soreness in your incision resolves. ? ?Bathing/Showering ? ?You may shower after you go home. Keep your incision dry for 48 hours. Do not soak in a bathtub, hot tub, or swim until the incision heals completely. You may not shower if you have a hemodialysis catheter. ? ?Incision Care ? ?Clean your incision with mild soap and water after 48 hours. Pat the area dry with a clean towel. You do not need a bandage unless otherwise instructed. Do not apply any ointments or creams to your incision. You may have skin glue on your incision. Do not peel it off. It will come off on its own in about one week. Your arm may swell a bit after surgery. To reduce swelling use pillows to elevate your arm so it is above your heart. Your doctor will tell you if you need to lightly wrap your arm with an ACE bandage. ? ?Diet ? ?Resume your normal diet. There are not special food restrictions following this procedure. In order to heal from your surgery, it is CRITICAL to get adequate nutrition. Your body requires vitamins, minerals, and protein. Vegetables are the best source of vitamins and minerals. Vegetables also provide the perfect balance of protein. Processed food has little nutritional value, so try to avoid this. ? ?Medications ? ?Resume taking all of your medications. If your incision is causing pain, you may take over-the counter pain relievers such as acetaminophen (Tylenol). If you were prescribed a stronger pain medication, please be aware these medications can cause nausea and constipation. Prevent  nausea by taking the medication with a snack or meal. Avoid constipation by drinking plenty of fluids and eating foods with high amount of fiber, such as fruits, vegetables, and grains.  ?Do not take Tylenol if you are taking prescription pain medications. ? ?Follow up ?Your surgeon may want to see you in the office following your access surgery. If so, this will be arranged at the time of your surgery. ? ?Please call us immediately for any of the following conditions: ? ?Increased pain, redness, drainage (pus) from your incision site ?Fever of 101 degrees or higher ?Severe or worsening pain at your incision site ?Hand pain or numbness. ? ?Reduce your risk of vascular disease: ? ?Stop smoking. If you would like help, call [facility] at 1-800-QUIT-NOW [phone]) or [place] at [phone] ? ?Manage your cholesterol ?Maintain a desired weight ?Control your diabetes ?Keep your blood pressure down ? ?Dialysis ? ?It will take several weeks to several months for your new dialysis access to be ready for use. Your surgeon will determine when it is okay to use it. Your nephrologist will continue to direct your dialysis. You can continue to use your Permcath until your new access is ready for use. ? ? ?04/14/2021 ?Patricia [name] ?[id number] ?[birth year] ? ?Surgeon(s): ?Broadus John, MD ? ?Procedure(s): ?RIGHT ARM FISTULA SUPERFICIALIZATION ? ? May stick graft immediately  ? May stick graft on designated area only:   ?X Do not stick right AV fistula for 6 weeks  ? ? ?If you have any questions, please call the office at [phone]. ?

## 2021-04-14 NOTE — H&P (Signed)
? ?Patient seen and examined in preop holding.  No complaints. ?No changes to medication history or physical exam since last seen in clinic. ?After discussing the risks and benefits of right sided fistula superficialization, Patricia [name] elected to proceed.  ? ?Broadus John MD ? ? ?Office Note  ? ? ?HPI: Patricia [name] is a Right handed 46 y.o. ([birth year]) female with kidney disease currently being dialyzed Tuesday Thursday Saturday, who presents in follow-up status post right-sided brachiocephalic fistula creation.  The operation, [name] has been doing well.  At the last couple of weeks, she has appreciated some numbness and tingling in the fingertips that waxes and wanes.  This is especially noted when she uses her phone or use the hand for significant amount of time.  This does not wake her up at night, there is no pain associated.  She continues to dialyze using a left-sided tunneled HD catheter.  She denies wound healing concerns. ? ?The pt is on a statin for cholesterol management.  ?The pt is not on a daily aspirin.   Other AC:   ?The pt is  on medications for hypertension.   ?The pt is not diabetic. ?Tobacco hx:  never ? ?Past Medical History:  ?Diagnosis Date  ? Acid reflux   ? Anemia 04/2019  ? REQUIRING BLOOD TRANSFUSIONS  ? Anxiety   ? Arthritis   ? CHF (congestive heart failure) ([place])   ? Dyspnea   ? with exertion  ? Dysrhythmia   ? tachycardia  ? ESRD (end stage renal disease) ([place])   ? [place]  ? Facial twitching 11/2020  ? mouth twitch, eyes roll back, neurology started patient on low dose   Sinemet IR.  ? GI bleeding 12/2017  ? Gout   ? Headache(784.0)   ? "related to chemo; sometimes weekly" (09/12/2013)  ? Heart murmur   ? Systolic per Dr Dixie Dials' notes  ? High cholesterol   ? History of blood transfusion "a couple"  ? "related to low counts"  ? Hypertension   ? Iron deficiency anemia   ? "get epogen shots q month" (02/20/2014)  ? MCGN (minimal change  glomerulonephritis)   ? "using chemo to tx;  finished my last tx in 12/2013"  ? Neuropathy   ? Peritoneal dialysis status ([place])   ? Stroke [facility]) 08/15/2017  ? ruled out that it was not a stroke  ? ? ?Past Surgical History:  ?Procedure Laterality Date  ? ABDOMINAL HYSTERECTOMY  2010  ? "laparoscopic"  ? ANKLE FRACTURE SURGERY Right 1994  ? AV FISTULA PLACEMENT Left 04/14/2016  ? Procedure: ARTERIOVENOUS (AV) FISTULA CREATION;  Surgeon: Angelia Mould, MD;  Location: [place];  Service: Vascular;  Laterality: Left;  ? AV FISTULA PLACEMENT Left 08/09/2017  ? Procedure: INSERTION OF ARTERIOVENOUS (AV) GORE-TEX GRAFT LEFT ARM;  Surgeon: Elam Dutch, MD;  Location: [facility] OR;  Service: Vascular;  Laterality: Left;  ? AV FISTULA PLACEMENT Right 03/15/2020  ? Procedure: RIGHT ARM ARTERIOVENOUS (AV) FISTULA CREATION 1st Stage Basilic Transposition;  Surgeon: Serafina Mitchell, MD;  [facility];  Service: Vascular;  Laterality: Right;  ? AV FISTULA PLACEMENT Right 02/24/2021  ? Procedure: RIGHT ARM ARTERIOVENOUS  FISTULA CREATION;  Surgeon: Broadus John, MD;  Location: [facility] OR;  Service: Vascular;  Laterality: Right;  regional block  ? [place] REMOVAL Left 08/11/2017  ? Procedure: REMOVAL OF ARTERIOVENOUS GORETEX GRAFT ([place]);  Surgeon: Serafina Mitchell, MD;  Location: [place];  Service: Vascular;  Laterality: Left;  ? BASCILIC VEIN TRANSPOSITION Right 05/17/2020  ? Procedure: RIGHT UPPER EXTREMITY SECOND STAGE BASCILIC VEIN TRANSPOSITION;  Surgeon: Serafina Mitchell, MD;  Location: [place];  Service: Vascular;  Laterality: Right;  ? BIOPSY  09/03/2017  ? Procedure: BIOPSY;  Surgeon: Otis Brace, MD;  Location: [facility] ENDOSCOPY;  Service: Gastroenterology;;  ? BIOPSY  12/24/2017  ? Procedure: BIOPSY;  Surgeon: Irving Copas., MD;  Location: [place];  Service: Gastroenterology;;  ? CARDIAC CATHETERIZATION  2000's  ? COLONOSCOPY WITH PROPOFOL N/A 09/04/2017  ? Procedure: COLONOSCOPY WITH PROPOFOL;  Surgeon: Ronnette Juniper, MD;  Location: [place];  Service: Gastroenterology;  Laterality: N/A;  ? ENTEROSCOPY N/A 12/24/2017  ? Procedure: ENTEROSCOPY;  Surgeon: Rush Landmark Telford Nab., MD;  Location: [place];  Service: Gastroenterology;  Laterality: N/A;  ? ESOPHAGOGASTRODUODENOSCOPY    ? ESOPHAGOGASTRODUODENOSCOPY (EGD) WITH PROPOFOL Left 06/04/2017  ? Procedure: ESOPHAGOGASTRODUODENOSCOPY (EGD) WITH PROPOFOL;  Surgeon: Arta Silence, MD;  Location: [facility] ENDOSCOPY;  Service: Endoscopy;  Laterality: Left;  ? ESOPHAGOGASTRODUODENOSCOPY (EGD) WITH PROPOFOL N/A 09/03/2017  ? Procedure: ESOPHAGOGASTRODUODENOSCOPY (EGD) WITH PROPOFOL;  Surgeon: Otis Brace, MD;  [facility] ENDOSCOPY;  Service: Gastroenterology;  Laterality: N/A;  ? FISTULA SUPERFICIALIZATION Left 04/02/2017  ? Procedure: FISTULA SUPERFICIALIZATION LEFT ARM;  Surgeon: Serafina Mitchell, MD;  Location: [place];  Service: Vascular;  Laterality: Left;  ? FISTULOGRAM Left 04/07/2016  ? Procedure: FISTULOGRAM;  Surgeon: Waynetta Sandy, MD;  Location: [place];  Service: Vascular;  Laterality: Left;  ? FRACTURE SURGERY    ? right ankle  ? GIVENS CAPSULE STUDY N/A 10/29/2017  ? Procedure: GIVENS CAPSULE STUDY;  Surgeon: Wilford Corner, MD;  Location: [facility] ENDOSCOPY;  Service: Endoscopy;  Laterality: N/A;  ? GIVENS CAPSULE STUDY N/A 12/24/2017  ? Procedure: GIVENS CAPSULE STUDY;  Surgeon: Irving Copas., MD;  Location: [place];  Service: Gastroenterology;  Laterality: N/A;  ? GIVENS CAPSULE STUDY N/A 05/22/2018  ? Procedure: GIVENS CAPSULE STUDY;  Surgeon: Irving Copas., MD;  Location: [place];  Service: Gastroenterology;  Laterality: N/A;  ? INSERTION OF DIALYSIS CATHETER Right 04/07/2016  ? Procedure: INSERTION OF DIALYSIS CATHETER;  Surgeon: Waynetta Sandy, MD;  Location: [place];  Service: Vascular;  Laterality: Right;  ? INSERTION OF DIALYSIS CATHETER Right 04/04/2017  ? Procedure: INSERTION OF DIALYSIS CATHETER;  Surgeon:  Waynetta Sandy, MD;  Location: [place];  Service: Vascular;  Laterality: Right;  ? IR FLUORO GUIDE CV LINE LEFT  03/13/2020  ? IR US GUIDE VASC ACCESS LEFT  03/13/2020  ? LEFT HEART CATH AND CORONARY ANGIOGRAPHY N/A 05/12/2019  ? Procedure: LEFT HEART CATH AND CORONARY ANGIOGRAPHY;  Surgeon: Dixie Dials, MD;  Location: [facility] CV LAB;  Service: Cardiovascular;  Laterality: N/A;  ? LIGATION OF ARTERIOVENOUS  FISTULA Left 04/14/2016  ? Procedure: LIGATION OF ARTERIOVENOUS  FISTULA;  Surgeon: Angelia Mould, MD;  Location: [place];  Service: Vascular;  Laterality: Left;  ? LIGATION OF COMPETING BRANCHES OF ARTERIOVENOUS FISTULA Right 05/17/2020  ? Procedure: LIGATION OF COMPETING BRANCHES OF RIGHT ARM ARTERIOVENOUS FISTULA;  Surgeon: Serafina Mitchell, MD;  [facility];  Service: Vascular;  Laterality: Right;  ? PATCH ANGIOPLASTY Left 06/19/2016  ? Procedure: PATCH ANGIOPLASTY LEFT ARTERIOVENOUS FISTULA;  Surgeon: Angelia Mould, MD;  Location: [place];  Service: Vascular;  Laterality: Left;  ? POLYPECTOMY  09/04/2017  ? Procedure: POLYPECTOMY;  Surgeon: Ronnette Juniper, MD;  Location: [place];  Service: Gastroenterology;;  ?  REVISON OF ARTERIOVENOUS FISTULA Left 06/19/2016  ? Procedure: REVISION SUPERFICIALIZATION OF BRACHIOCEPHALIC ARTERIOVENOUS FISTULA;  Surgeon: Angelia Mould, MD;  Location: [place];  Service: Vascular;  Laterality: Left;  ? UPPER EXTREMITY VENOGRAPHY Bilateral 02/19/2021  ? Procedure: UPPER EXTREMITY VENOGRAPHY;  Surgeon: Broadus John, MD;  Location: [place] CV LAB;  Service: Cardiovascular;  Laterality: Bilateral;  ? ? ?Social History  ? ?Socioeconomic History  ? Marital status: Single  ?  Spouse name: Not on file  ? Number of children: 3  ? Years of education: 12  ? Highest education level: Associate degree: academic program  ?Occupational History  ? Occupation: disabled  ?Tobacco Use  ? Smoking status: Never  ? Smokeless tobacco: Never  ?Vaping Use  ? Vaping  Use: Never used  ?Substance and Sexual Activity  ? Alcohol use: No  ? Drug use: Never  ? Sexual activity: Not Currently  ?  Birth control/protection: Surgical  ?Other Topics Concern  ? Not on file  ?Social History N

## 2021-04-14 NOTE — Op Note (Signed)
? ? ?  NAME: Patricia [name]    MRN: [id number] ?DOB: 09/14/1975    DATE OF OPERATION: 04/14/2021 ? ?PREOP DIAGNOSIS:   ? ?End-stage renal disease ? ?POSTOP DIAGNOSIS:   ? ?Same ? ?PROCEDURE:  ?  ?Right arm fistula revision, superficialization of brachiocephalic fistula ? ?SURGEON: Broadus John ? ?ASSIST: Paulo Fruit, PA ? ?ANESTHESIA: Moderate, block ? ?EBL: 25 mL ? ?INDICATIONS:  ? ?Patricia [name] is a 46 y.o. female who presents status post 02/24/2021 right-sided brachiocephalic fistula creation.  Patient has a palpable thrill in the right arm, with a 2+ radial pulse.  She does note symptoms consistent with mild steal symptoms, but has a history of baseline neuropathy in feet and hands.  The numbness appreciated only occurs with significant use of the hand.  She was given a stress ball today in clinic for exercises. Ultrasound was reviewed demonstrating a nicely dilated fistula with several branches. Unfortunately, due to the patient's large arm, the fistula depth is greater than 6 mm.  [name] would benefit from superficialization, branch ligation to aid in fistula access.  Discussed the risk and benefits, [name] elected to proceed.  ?  ?FINDINGS:  ? ? 69mm brachiocephalic fistula >5VV in depth ? ?TECHNIQUE:  ? ?Patient was brought to the OR and laid in supine position.  Moderate anesthesia was induced. The patient was prepped and draped in standard fashion.  A timeout was performed. ?  ?The case began with right arm ultrasound fistula mapping.  Multiple branches were noted and marked.  A longitudinal incision was made along the course of the cephalic vein with 5 cm bridge in the middle.  This was carried through the subcutaneous fat to the brachiocephalic fistula.  The fistula was mobilized and multiple branches ligated using 2-0 silk and clips.  Once mobilized, the subcutaneous tissue plane was made less than 5 mm from skin between the dermis and subcutaneous fat.  The subcutaneous fat was  opposed to the opposite side using 2.0 vicryl, creating a bedding for the fistula.  Hemostasis was achieved with the use of cautery and suture.  The subcutaneous flap was closed using 3-0 Vicryl and 4-0 Monocryl at the skin.  There was a palpable thrill in the fistula at case completion with excellent pulse in the wrist.   ?  ?Given the complexity of the case a first assistant was necessary in order to expedient the procedure and safely perform the technical aspects of the operation. ? ?Macie Burows, MD ?[facility] ? ?DATE OF DICTATION:   04/14/2021 ? ?

## 2021-04-14 NOTE — Progress Notes (Signed)
? ?Patient seen and examined in preop holding.  No complaints. ?No changes to medication history or physical exam since last seen in clinic. ?After discussing the risks and benefits of right sided fistula superficialization, Akua Debbe Odea elected to proceed.  ? ?Victorino Sparrow MD ? ? ?Office Note  ? ? ?HPI: Patricia [name] is a Right handed 46 y.o. (09-06-1975) female with kidney disease currently being dialyzed Tuesday Thursday Saturday, who presents in follow-up status post right-sided brachiocephalic fistula creation.  The operation, Thayer Ohm has been doing well.  At the last couple of weeks, she has appreciated some numbness and tingling in the fingertips that waxes and wanes.  This is especially noted when she uses her phone or use the hand for significant amount of time.  This does not wake her up at night, there is no pain associated.  She continues to dialyze using a left-sided tunneled HD catheter.  She denies wound healing concerns. ? ?The pt is on a statin for cholesterol management.  ?The pt is not on a daily aspirin.   Other AC:   ?The pt is  on medications for hypertension.   ?The pt is not diabetic. ?Tobacco hx:  never ? ?Past Medical History:  ?Diagnosis Date  ? Acid reflux   ? Anemia 04/2019  ? REQUIRING BLOOD TRANSFUSIONS  ? Anxiety   ? Arthritis   ? CHF (congestive heart failure) ([facility])   ? Dyspnea   ? with exertion  ? Dysrhythmia   ? tachycardia  ? ESRD (end stage renal disease) ([facility])   ? [facility] [place]  ? Facial twitching 11/2020  ? mouth twitch, eyes roll back, neurology started patient on low dose   Sinemet IR.  ? GI bleeding 12/2017  ? Gout   ? Headache(784.0)   ? "related to chemo; sometimes weekly" (09/12/2013)  ? Heart murmur   ? Systolic per Dr Orpah Cobb' notes  ? High cholesterol   ? History of blood transfusion "a couple"  ? "related to low counts"  ? Hypertension   ? Iron deficiency anemia   ? "get epogen shots q month" (02/20/2014)  ? MCGN (minimal change  glomerulonephritis)   ? "using chemo to tx;  finished my last tx in 12/2013"  ? Neuropathy   ? Peritoneal dialysis status ([facility])   ? Stroke [facility]) 08/15/2017  ? ruled out that it was not a stroke  ? ? ?Past Surgical History:  ?Procedure Laterality Date  ? ABDOMINAL HYSTERECTOMY  2010  ? "laparoscopic"  ? ANKLE FRACTURE SURGERY Right 1994  ? AV FISTULA PLACEMENT Left 04/14/2016  ? Procedure: ARTERIOVENOUS (AV) FISTULA CREATION;  Surgeon: Chuck Hint, MD;  Location: [facility] OR;  Service: Vascular;  Laterality: Left;  ? AV FISTULA PLACEMENT Left 08/09/2017  ? Procedure: INSERTION OF ARTERIOVENOUS (AV) GORE-TEX GRAFT LEFT ARM;  Surgeon: Sherren Kerns, MD;  Location: [facility] OR;  Service: Vascular;  Laterality: Left;  ? AV FISTULA PLACEMENT Right 03/15/2020  ? Procedure: RIGHT ARM ARTERIOVENOUS (AV) FISTULA CREATION 1st Stage Basilic Transposition;  Surgeon: Nada Libman, MD;  [facility];  Service: Vascular;  Laterality: Right;  ? AV FISTULA PLACEMENT Right 02/24/2021  ? Procedure: RIGHT ARM ARTERIOVENOUS  FISTULA CREATION;  Surgeon: Victorino Sparrow, MD;  Location: [facility] OR;  Service: Vascular;  Laterality: Right;  regional block  ? AVGG REMOVAL Left 08/11/2017  ? Procedure: REMOVAL OF ARTERIOVENOUS GORETEX GRAFT (AVGG);  Surgeon: Nada Libman, MD;  [facility];  Service: Vascular;  Laterality: Left;  ? BASCILIC VEIN TRANSPOSITION Right 05/17/2020  ? Procedure: RIGHT UPPER EXTREMITY SECOND STAGE BASCILIC VEIN TRANSPOSITION;  Surgeon: Serafina Mitchell, MD;  Location: [place];  Service: Vascular;  Laterality: Right;  ? BIOPSY  09/03/2017  ? Procedure: BIOPSY;  Surgeon: Otis Brace, MD;  Location: [facility] ENDOSCOPY;  Service: Gastroenterology;;  ? BIOPSY  12/24/2017  ? Procedure: BIOPSY;  Surgeon: Irving Copas., MD;  Location: [place];  Service: Gastroenterology;;  ? CARDIAC CATHETERIZATION  2000's  ? COLONOSCOPY WITH PROPOFOL N/A 09/04/2017  ? Procedure: COLONOSCOPY WITH PROPOFOL;  Surgeon: Ronnette Juniper, MD;  Location: [place];  Service: Gastroenterology;  Laterality: N/A;  ? ENTEROSCOPY N/A 12/24/2017  ? Procedure: ENTEROSCOPY;  Surgeon: Rush Landmark Telford Nab., MD;  Location: [place];  Service: Gastroenterology;  Laterality: N/A;  ? ESOPHAGOGASTRODUODENOSCOPY    ? ESOPHAGOGASTRODUODENOSCOPY (EGD) WITH PROPOFOL Left 06/04/2017  ? Procedure: ESOPHAGOGASTRODUODENOSCOPY (EGD) WITH PROPOFOL;  Surgeon: Arta Silence, MD;  Location: [facility] ENDOSCOPY;  Service: Endoscopy;  Laterality: Left;  ? ESOPHAGOGASTRODUODENOSCOPY (EGD) WITH PROPOFOL N/A 09/03/2017  ? Procedure: ESOPHAGOGASTRODUODENOSCOPY (EGD) WITH PROPOFOL;  Surgeon: Otis Brace, MD;  [facility] ENDOSCOPY;  Service: Gastroenterology;  Laterality: N/A;  ? FISTULA SUPERFICIALIZATION Left 04/02/2017  ? Procedure: FISTULA SUPERFICIALIZATION LEFT ARM;  Surgeon: Serafina Mitchell, MD;  Location: [place];  Service: Vascular;  Laterality: Left;  ? FISTULOGRAM Left 04/07/2016  ? Procedure: FISTULOGRAM;  Surgeon: Waynetta Sandy, MD;  Location: [place];  Service: Vascular;  Laterality: Left;  ? FRACTURE SURGERY    ? right ankle  ? GIVENS CAPSULE STUDY N/A 10/29/2017  ? Procedure: GIVENS CAPSULE STUDY;  Surgeon: Wilford Corner, MD;  Location: [facility] ENDOSCOPY;  Service: Endoscopy;  Laterality: N/A;  ? GIVENS CAPSULE STUDY N/A 12/24/2017  ? Procedure: GIVENS CAPSULE STUDY;  Surgeon: Irving Copas., MD;  Location: [place];  Service: Gastroenterology;  Laterality: N/A;  ? GIVENS CAPSULE STUDY N/A 05/22/2018  ? Procedure: GIVENS CAPSULE STUDY;  Surgeon: Irving Copas., MD;  Location: [place];  Service: Gastroenterology;  Laterality: N/A;  ? INSERTION OF DIALYSIS CATHETER Right 04/07/2016  ? Procedure: INSERTION OF DIALYSIS CATHETER;  Surgeon: Waynetta Sandy, MD;  Location: [place];  Service: Vascular;  Laterality: Right;  ? INSERTION OF DIALYSIS CATHETER Right 04/04/2017  ? Procedure: INSERTION OF DIALYSIS CATHETER;  Surgeon:  Waynetta Sandy, MD;  Location: [place];  Service: Vascular;  Laterality: Right;  ? IR FLUORO GUIDE CV LINE LEFT  03/13/2020  ? IR US GUIDE VASC ACCESS LEFT  03/13/2020  ? LEFT HEART CATH AND CORONARY ANGIOGRAPHY N/A 05/12/2019  ? Procedure: LEFT HEART CATH AND CORONARY ANGIOGRAPHY;  Surgeon: Dixie Dials, MD;  Location: [place] CV LAB;  Service: Cardiovascular;  Laterality: N/A;  ? LIGATION OF ARTERIOVENOUS  FISTULA Left 04/14/2016  ? Procedure: LIGATION OF ARTERIOVENOUS  FISTULA;  Surgeon: Angelia Mould, MD;  Location: [place];  Service: Vascular;  Laterality: Left;  ? LIGATION OF COMPETING BRANCHES OF ARTERIOVENOUS FISTULA Right 05/17/2020  ? Procedure: LIGATION OF COMPETING BRANCHES OF RIGHT ARM ARTERIOVENOUS FISTULA;  Surgeon: Serafina Mitchell, MD;  [facility];  Service: Vascular;  Laterality: Right;  ? PATCH ANGIOPLASTY Left 06/19/2016  ? Procedure: PATCH ANGIOPLASTY LEFT ARTERIOVENOUS FISTULA;  Surgeon: Angelia Mould, MD;  Location: [place];  Service: Vascular;  Laterality: Left;  ? POLYPECTOMY  09/04/2017  ? Procedure: POLYPECTOMY;  Surgeon: Ronnette Juniper, MD;  Location: [place];  Service: Gastroenterology;;  ?  REVISON OF ARTERIOVENOUS FISTULA Left 06/19/2016  ? Procedure: REVISION SUPERFICIALIZATION OF BRACHIOCEPHALIC ARTERIOVENOUS FISTULA;  Surgeon: Angelia Mould, MD;  Location: [place];  Service: Vascular;  Laterality: Left;  ? UPPER EXTREMITY VENOGRAPHY Bilateral 02/19/2021  ? Procedure: UPPER EXTREMITY VENOGRAPHY;  Surgeon: Broadus John, MD;  Location: [place] CV LAB;  Service: Cardiovascular;  Laterality: Bilateral;  ? ? ?Social History  ? ?Socioeconomic History  ? Marital status: Single  ?  Spouse name: Not on file  ? Number of children: 3  ? Years of education: 72  ? Highest education level: Associate degree: academic program  ?Occupational History  ? Occupation: disabled  ?Tobacco Use  ? Smoking status: Never  ? Smokeless tobacco: Never  ?Vaping Use  ? Vaping  Use: Never used  ?Substance and Sexual Activity  ? Alcohol use: No  ? Drug use: Never  ? Sexual activity: Not Currently  ?  Birth control/protection: Surgical  ?Other Topics Concern  ? Not on file  ?Social History N

## 2021-04-15 ENCOUNTER — Encounter (HOSPITAL_COMMUNITY): Payer: Self-pay | Admitting: Vascular Surgery

## 2021-04-16 ENCOUNTER — Encounter: Payer: Medicare Other | Admitting: Family Medicine

## 2021-04-16 ENCOUNTER — Encounter: Payer: Medicare Other | Admitting: Podiatry

## 2021-04-17 NOTE — Telephone Encounter (Signed)
Lm to get pt scheduled for Xeomin injection. ?

## 2021-04-23 ENCOUNTER — Encounter: Payer: Medicare Other | Admitting: Podiatry

## 2021-04-23 ENCOUNTER — Ambulatory Visit (INDEPENDENT_AMBULATORY_CARE_PROVIDER_SITE_OTHER): Payer: Medicare Other

## 2021-04-23 ENCOUNTER — Ambulatory Visit (INDEPENDENT_AMBULATORY_CARE_PROVIDER_SITE_OTHER): Payer: Medicare Other | Admitting: Podiatry

## 2021-04-23 DIAGNOSIS — M216X1 Other acquired deformities of right foot: Secondary | ICD-10-CM

## 2021-04-23 MED ORDER — OXYCODONE-ACETAMINOPHEN 5-325 MG PO TABS: 1.0000 | ORAL_TABLET | Freq: Four times a day (QID) | ORAL | 0 refills | Status: DC | PRN

## 2021-04-23 NOTE — Progress Notes (Signed)
Surgeon: Dr. Boneta Lucks ?Assistants: None ?Pre-operative diagnosis: Right plantarflexed fifth metatarsal ?Post-operative diagnosis: same ?Procedure: Right fifth metatarsal osteotomy ?Pathology: None ?Pertinent Intra-op findings: Plantarflexed fifth metatarsal noted ?Anesthesia: 10 cc of one-to-one mixture 1% lidocaine plain half percent Marcaine plain ?Hemostasis: Ankle tourniquet for 250 mmHg pressure for 8 minutes ?EBL: 50 cc ?Materials: 3-0 Prolene ?Injectables: None ?Complications: None ? ?Indications for surgery: ?A 46 y.o. female presents with right fifth metatarsal plantarflexed painful with underlying ulcer. Patient has failed all conservative therapy including but not limited to local wound care antibiotics shoe gear modification. She wishes to have surgical correction of the foot/deformity. It was determined that patient would benefit from right fifth metatarsal floating osteotomy. ?Informed surgical risk consent was reviewed and read aloud to the patient.  I reviewed the films.  I have discussed my findings with the patient in great detail.  I have discussed all risks including but not limited to infection, stiffness, scarring, limp, disability, deformity, damage to blood vessels and nerves, numbness, poor healing, need for braces, arthritis, chronic pain, amputation, death.  All benefits and realistic expectations discussed in great detail.  I have made no promises as to the outcome.  I have provided realistic expectations.  I have offered the patient a 2nd opinion, which they have declined and assured me they preferred to proceed despite the risks ? ? ?Procedure in detail: ?The patient was both verbally and visually identified by myself, the nursing staff, and anesthesia staff in the preoperative holding area. They were then transferred to the operating room and placed on the operative table in supine position. ? ?Attention was directed to the right dorsal lateral foot, a skin marker was used to  delineate a longitudinal incision.  Using #15 blade the incision was carried down from epidermal dermal junction down to the level of the bone.  At this time the periosteum was not reflected.  A sagittal saw was used to make a reverse Wilson cut in standard technique.  No fixation was used to allow the floating of the osteotomy.  Good reduction of pressure noted submetatarsal 5.  The wound was thoroughly irrigated.  The wound was closed with 3-0 Prolene in standard technique bony prominences were padded.  4 x 4 gauze Xeroform Kerlix and Ace bandage was used. ? ?At the conclusion of the procedure the patient was awoken from anesthesia and found to have tolerated the procedure well any complications. There were transferred to PACU with vital signs stable and vascular status intact. ? ?Boneta Lucks, DPM ? ?

## 2021-04-23 NOTE — Progress Notes (Signed)
G

## 2021-04-26 ENCOUNTER — Emergency Department (HOSPITAL_COMMUNITY)
Admission: EM | Admit: 2021-04-26 | Discharge: 2021-04-27 | Disposition: A | Discharge status: Home / Self Care | Payer: Medicare Other | Attending: Emergency Medicine | Admitting: Emergency Medicine

## 2021-04-26 ENCOUNTER — Emergency Department (HOSPITAL_COMMUNITY): Payer: Medicare Other

## 2021-04-26 DIAGNOSIS — R079 Chest pain, unspecified: Secondary | ICD-10-CM | POA: Diagnosis present

## 2021-04-26 DIAGNOSIS — Z992 Dependence on renal dialysis: Secondary | ICD-10-CM | POA: Diagnosis not present

## 2021-04-26 DIAGNOSIS — I471 Supraventricular tachycardia: Secondary | ICD-10-CM | POA: Insufficient documentation

## 2021-04-26 DIAGNOSIS — E875 Hyperkalemia: Secondary | ICD-10-CM | POA: Diagnosis not present

## 2021-04-26 DIAGNOSIS — N186 End stage renal disease: Secondary | ICD-10-CM | POA: Diagnosis not present

## 2021-04-26 DIAGNOSIS — I509 Heart failure, unspecified: Secondary | ICD-10-CM | POA: Diagnosis not present

## 2021-04-26 DIAGNOSIS — Z955 Presence of coronary angioplasty implant and graft: Secondary | ICD-10-CM | POA: Diagnosis not present

## 2021-04-26 DIAGNOSIS — I132 Hypertensive heart and chronic kidney disease with heart failure and with stage 5 chronic kidney disease, or end stage renal disease: Secondary | ICD-10-CM | POA: Diagnosis not present

## 2021-04-26 NOTE — ED Triage Notes (Signed)
Pt bib [facility] from home c/o SOB + CP. History SVT, took 50 mg metoprolol prior to EMS arrival. HR 150s with EMS, HR 80s after $RemoveB'18mg'GRXUdyJE$  adenosine. $RemoveBef'4mg'FVmxRoBjQF$  zofran given PTA. Pt went to dialysis this AM ?

## 2021-04-26 NOTE — ED Provider Notes (Signed)
?[place] DEPT ?[facility] Emergency Department ?Provider Note ?MRN:  [id number]  ?Arrival date & time: 04/27/21    ? ?Chief Complaint   ?Chest pain ?History of Present Illness   ?Kolby Alaney Witter is a 46 y.o. year-old female with a history of CHF, ESRD presenting to the ED with chief complaint of chest pain. ? ?A few hours ago patient experienced sudden onset chest pain, shortness of breath, palpitations.  Was found to be in SVT with EMS.  The heart rate has gone back to normal and patient feels a lot better.  Still having some chest pressure. ? ?Review of Systems  ?A thorough review of systems was obtained and all systems are negative except as noted in the HPI and PMH.  ? ?Patient's Health History   ? ?Past Medical History:  ?Diagnosis Date  ? Acid reflux   ? Anemia 04/2019  ? REQUIRING BLOOD TRANSFUSIONS  ? Anxiety   ? Arthritis   ? CHF (congestive heart failure) ([place])   ? Dyspnea   ? with exertion  ? Dysrhythmia   ? tachycardia  ? ESRD (end stage renal disease) ([place])   ? [place]  ? Facial twitching 11/2020  ? mouth twitch, eyes roll back, neurology started patient on low dose   Sinemet IR.  ? GI bleeding 12/2017  ? Gout   ? Headache(784.0)   ? "related to chemo; sometimes weekly" (09/12/2013)  ? Heart murmur   ? Systolic per Dr Dixie Dials' notes  ? High cholesterol   ? History of blood transfusion "a couple"  ? "related to low counts"  ? Hypertension   ? Iron deficiency anemia   ? "get epogen shots q month" (02/20/2014)  ? MCGN (minimal change glomerulonephritis)   ? "using chemo to tx;  finished my last tx in 12/2013"  ? Neuropathy   ? Peritoneal dialysis status ([place])   ? Stroke [facility]) 08/15/2017  ? ruled out that it was not a stroke  ?  ?Past Surgical History:  ?Procedure Laterality Date  ? ABDOMINAL HYSTERECTOMY  2010  ? "laparoscopic"  ? ANKLE FRACTURE SURGERY Right 1994  ? AV FISTULA PLACEMENT Left 04/14/2016  ? Procedure: ARTERIOVENOUS (AV) FISTULA CREATION;  Surgeon:  Angelia Mould, MD;  Location: [place];  Service: Vascular;  Laterality: Left;  ? AV FISTULA PLACEMENT Left 08/09/2017  ? Procedure: INSERTION OF ARTERIOVENOUS (AV) GORE-TEX GRAFT LEFT ARM;  Surgeon: Elam Dutch, MD;  Location: [facility] OR;  Service: Vascular;  Laterality: Left;  ? AV FISTULA PLACEMENT Right 03/15/2020  ? Procedure: RIGHT ARM ARTERIOVENOUS (AV) FISTULA CREATION 1st Stage Basilic Transposition;  Surgeon: Serafina Mitchell, MD;  Location: [facility] OR;  Service: Vascular;  Laterality: Right;  ? AV FISTULA PLACEMENT Right 02/24/2021  ? Procedure: RIGHT ARM ARTERIOVENOUS  FISTULA CREATION;  Surgeon: Broadus John, MD;  Location: [facility] OR;  Service: Vascular;  Laterality: Right;  regional block  ? [place] REMOVAL Left 08/11/2017  ? Procedure: REMOVAL OF ARTERIOVENOUS GORETEX GRAFT ([place]);  Surgeon: Serafina Mitchell, MD;  Location: [facility] OR;  Service: Vascular;  Laterality: Left;  ? BASCILIC VEIN TRANSPOSITION Right 05/17/2020  ? Procedure: RIGHT UPPER EXTREMITY SECOND STAGE BASCILIC VEIN TRANSPOSITION;  Surgeon: Serafina Mitchell, MD;  Location: [place];  Service: Vascular;  Laterality: Right;  ? BIOPSY  09/03/2017  ? Procedure: BIOPSY;  Surgeon: Otis Brace, MD;  Location: [facility] ENDOSCOPY;  Service: Gastroenterology;;  ? BIOPSY  12/24/2017  ? Procedure: BIOPSY;  Surgeon: Justice Britain  Brooke Bonito., MD;  Location: [place];  Service: Gastroenterology;;  ? CARDIAC CATHETERIZATION  2000's  ? COLONOSCOPY WITH PROPOFOL N/A 09/04/2017  ? Procedure: COLONOSCOPY WITH PROPOFOL;  Surgeon: Ronnette Juniper, MD;  Location: [place];  Service: Gastroenterology;  Laterality: N/A;  ? ENTEROSCOPY N/A 12/24/2017  ? Procedure: ENTEROSCOPY;  Surgeon: Rush Landmark Telford Nab., MD;  Location: [place];  Service: Gastroenterology;  Laterality: N/A;  ? ESOPHAGOGASTRODUODENOSCOPY    ? ESOPHAGOGASTRODUODENOSCOPY (EGD) WITH PROPOFOL Left 06/04/2017  ? Procedure: ESOPHAGOGASTRODUODENOSCOPY (EGD) WITH PROPOFOL;  Surgeon: Arta Silence, MD;   Location: [facility] ENDOSCOPY;  Service: Endoscopy;  Laterality: Left;  ? ESOPHAGOGASTRODUODENOSCOPY (EGD) WITH PROPOFOL N/A 09/03/2017  ? Procedure: ESOPHAGOGASTRODUODENOSCOPY (EGD) WITH PROPOFOL;  Surgeon: Otis Brace, MD;  Location: [facility] ENDOSCOPY;  Service: Gastroenterology;  Laterality: N/A;  ? FISTULA SUPERFICIALIZATION Left 04/02/2017  ? Procedure: FISTULA SUPERFICIALIZATION LEFT ARM;  Surgeon: Serafina Mitchell, MD;  Location: [place];  Service: Vascular;  Laterality: Left;  ? FISTULA SUPERFICIALIZATION Right 04/14/2021  ? Procedure: RIGHT ARM FISTULA SUPERFICIALIZATION;  Surgeon: Broadus John, MD;  Location: [facility] OR;  Service: Vascular;  Laterality: Right;  PERIPHERAL NERVE BLOCK  ? FISTULOGRAM Left 04/07/2016  ? Procedure: FISTULOGRAM;  Surgeon: Waynetta Sandy, MD;  Location: [place];  Service: Vascular;  Laterality: Left;  ? FRACTURE SURGERY    ? right ankle  ? GIVENS CAPSULE STUDY N/A 10/29/2017  ? Procedure: GIVENS CAPSULE STUDY;  Surgeon: Wilford Corner, MD;  Location: [facility] ENDOSCOPY;  Service: Endoscopy;  Laterality: N/A;  ? GIVENS CAPSULE STUDY N/A 12/24/2017  ? Procedure: GIVENS CAPSULE STUDY;  Surgeon: Irving Copas., MD;  Location: [place];  Service: Gastroenterology;  Laterality: N/A;  ? GIVENS CAPSULE STUDY N/A 05/22/2018  ? Procedure: GIVENS CAPSULE STUDY;  Surgeon: Irving Copas., MD;  Location: [place];  Service: Gastroenterology;  Laterality: N/A;  ? INSERTION OF DIALYSIS CATHETER Right 04/07/2016  ? Procedure: INSERTION OF DIALYSIS CATHETER;  Surgeon: Waynetta Sandy, MD;  Location: [place];  Service: Vascular;  Laterality: Right;  ? INSERTION OF DIALYSIS CATHETER Right 04/04/2017  ? Procedure: INSERTION OF DIALYSIS CATHETER;  Surgeon: Waynetta Sandy, MD;  Location: [place];  Service: Vascular;  Laterality: Right;  ? IR FLUORO GUIDE CV LINE LEFT  03/13/2020  ? IR US GUIDE VASC ACCESS LEFT  03/13/2020  ? LEFT HEART CATH AND CORONARY ANGIOGRAPHY N/A  05/12/2019  ? Procedure: LEFT HEART CATH AND CORONARY ANGIOGRAPHY;  Surgeon: Dixie Dials, MD;  Location: [place] CV LAB;  Service: Cardiovascular;  Laterality: N/A;  ? LIGATION OF ARTERIOVENOUS  FISTULA Left 04/14/2016  ? Procedure: LIGATION OF ARTERIOVENOUS  FISTULA;  Surgeon: Angelia Mould, MD;  Location: [place];  Service: Vascular;  Laterality: Left;  ? LIGATION OF COMPETING BRANCHES OF ARTERIOVENOUS FISTULA Right 05/17/2020  ? Procedure: LIGATION OF COMPETING BRANCHES OF RIGHT ARM ARTERIOVENOUS FISTULA;  Surgeon: Serafina Mitchell, MD;  Location: [facility] OR;  Service: Vascular;  Laterality: Right;  ? PATCH ANGIOPLASTY Left 06/19/2016  ? Procedure: PATCH ANGIOPLASTY LEFT ARTERIOVENOUS FISTULA;  Surgeon: Angelia Mould, MD;  Location: [place];  Service: Vascular;  Laterality: Left;  ? POLYPECTOMY  09/04/2017  ? Procedure: POLYPECTOMY;  Surgeon: Ronnette Juniper, MD;  Location: [facility] ENDOSCOPY;  Service: Gastroenterology;;  ? REVISON OF ARTERIOVENOUS FISTULA Left 06/19/2016  ? Procedure: REVISION SUPERFICIALIZATION OF BRACHIOCEPHALIC ARTERIOVENOUS FISTULA;  Surgeon: Angelia Mould, MD;  Location: [place];  Service: Vascular;  Laterality: Left;  ? UPPER EXTREMITY VENOGRAPHY Bilateral 02/19/2021  ?  Procedure: UPPER EXTREMITY VENOGRAPHY;  Surgeon: Broadus John, MD;  Location: [place] CV LAB;  Service: Cardiovascular;  Laterality: Bilateral;  ?  ?Family History  ?Problem Relation Age of Onset  ? Hypertension Mother   ? Thyroid disease Mother   ? Coronary artery disease Father   ? Hypertension Father   ? Diabetes Father   ? Colon cancer Maternal Aunt   ? Esophageal cancer Maternal Uncle   ? Inflammatory bowel disease Neg Hx   ? Liver disease Neg Hx   ? Pancreatic cancer Neg Hx   ? Rectal cancer Neg Hx   ? Stomach cancer Neg Hx   ?  ?Social History  ? ?Socioeconomic History  ? Marital status: Single  ?  Spouse name: Not on file  ? Number of children: 3  ? Years of education: 58  ? Highest education level:  Associate degree: academic program  ?Occupational History  ? Occupation: disabled  ?Tobacco Use  ? Smoking status: Never  ? Smokeless tobacco: Never  ?Vaping Use  ? Vaping Use: Never used  ?Substance and Sexual Activity  ?

## 2021-04-27 LAB — BASIC METABOLIC PANEL
Anion gap: 12 (ref 5–15)
BUN: 27 mg/dL — ABNORMAL HIGH (ref 6–20)
CO2: 27 mmol/L (ref 22–32)
Calcium: 9.5 mg/dL (ref 8.9–10.3)
Chloride: 96 mmol/L — ABNORMAL LOW (ref 98–111)
Creatinine, Ser: 8.59 mg/dL — ABNORMAL HIGH (ref 0.44–1.00)
GFR, Estimated: 5 mL/min — ABNORMAL LOW (ref >60)
Glucose, Bld: 114 mg/dL — ABNORMAL HIGH (ref 70–99)
Potassium: 5.4 mmol/L — ABNORMAL HIGH (ref 3.5–5.1)
Sodium: 135 mmol/L (ref 135–145)

## 2021-04-27 LAB — CBC
HCT: 36.5 % (ref 36.0–46.0)
Hemoglobin: 11 g/dL — ABNORMAL LOW (ref 12.0–15.0)
MCH: 26.7 pg (ref 26.0–34.0)
MCHC: 30.1 g/dL (ref 30.0–36.0)
MCV: 88.6 fL (ref 80.0–100.0)
Platelets: 230 10*3/uL (ref 150–400)
RBC: 4.12 MIL/uL (ref 3.87–5.11)
RDW: 17.9 % — ABNORMAL HIGH (ref 11.5–15.5)
WBC: 6.9 10*3/uL (ref 4.0–10.5)
nRBC: 1.2 % — ABNORMAL HIGH (ref 0.0–0.2)

## 2021-04-27 LAB — TROPONIN I (HIGH SENSITIVITY)
Troponin I (High Sensitivity): 7 ng/L (ref <18)
Troponin I (High Sensitivity): 7 ng/L (ref <18)

## 2021-04-27 MED ORDER — SODIUM ZIRCONIUM CYCLOSILICATE 10 G PO PACK
10.0000 g | PACK | Freq: Once | ORAL | Status: AC
  Administered 2021-04-27: 10 g via ORAL
  Filled 2021-04-27: qty 1

## 2021-04-27 NOTE — ED Notes (Signed)
RN reviewed discharge instructions with pt. Pt verbalized understanding and had no further questions. VSS upon discharge.  

## 2021-04-27 NOTE — Discharge Instructions (Signed)
You were evaluated in the Emergency Department and after careful evaluation, we did not find any emergent condition requiring admission or further testing in the hospital. ? ?Your exam/testing today is overall reassuring.  Labs do not show any signs of heart attack or any other emergencies.  Recommend follow-up with your regular doctors and your regular dialysis schedule. ? ?Please return to the Emergency Department if you experience any worsening of your condition.   Thank you for allowing [place] to be a part of your care. ?

## 2021-04-30 ENCOUNTER — Ambulatory Visit (INDEPENDENT_AMBULATORY_CARE_PROVIDER_SITE_OTHER): Payer: Medicare Other

## 2021-04-30 ENCOUNTER — Encounter: Payer: Medicare Other | Admitting: Podiatry

## 2021-04-30 ENCOUNTER — Ambulatory Visit (INDEPENDENT_AMBULATORY_CARE_PROVIDER_SITE_OTHER): Payer: Medicare Other | Admitting: Podiatry

## 2021-04-30 DIAGNOSIS — M216X1 Other acquired deformities of right foot: Secondary | ICD-10-CM | POA: Diagnosis not present

## 2021-04-30 DIAGNOSIS — Z9889 Other specified postprocedural states: Secondary | ICD-10-CM

## 2021-05-06 NOTE — Progress Notes (Signed)
?Subjective:  ?Patient ID: Patricia [name], female    DOB: January 14, 1975,  MRN: [id number] ? ?Chief Complaint  ?Patient presents with  ? Routine Post Op  ?  POV #1 DOS 04/23/2021 RT 5TH METATARSAL OSTEOTOMY FLOATING  ? ? ?DOS: 04/23/2021 ?Procedure: Right fifth metatarsal osteotomy ? ?46 y.o. female returns for post-op check.  Patient states that she is doing well.  She states the pain is minimal controllable.  She is weightbearing as tolerated in surgical shoe.  Bandages clean dry and intact ? ?Review of Systems: Negative except as noted in the HPI. Denies N/V/F/Ch. ? ?Past Medical History:  ?Diagnosis Date  ? Acid reflux   ? Anemia 04/2019  ? REQUIRING BLOOD TRANSFUSIONS  ? Anxiety   ? Arthritis   ? CHF (congestive heart failure) ([place])   ? Dyspnea   ? with exertion  ? Dysrhythmia   ? tachycardia  ? ESRD (end stage renal disease) ([place])   ? [place]  ? Facial twitching 11/2020  ? mouth twitch, eyes roll back, neurology started patient on low dose   Sinemet IR.  ? GI bleeding 12/2017  ? Gout   ? Headache(784.0)   ? "related to chemo; sometimes weekly" (09/12/2013)  ? Heart murmur   ? Systolic per Dr Dixie Dials' notes  ? High cholesterol   ? History of blood transfusion "a couple"  ? "related to low counts"  ? Hypertension   ? Iron deficiency anemia   ? "get epogen shots q month" (02/20/2014)  ? MCGN (minimal change glomerulonephritis)   ? "using chemo to tx;  finished my last tx in 12/2013"  ? Neuropathy   ? Peritoneal dialysis status ([place])   ? Stroke [facility]) 08/15/2017  ? ruled out that it was not a stroke  ? ? ?Current Outpatient Medications:  ?  acetaminophen (TYLENOL) 500 MG tablet, Take 1,000 mg by mouth every 6 (six) hours as needed for moderate pain., Disp: , Rfl:  ?  allopurinol (ZYLOPRIM) 100 MG tablet, Take 100 mg by mouth at bedtime. , Disp: , Rfl:  ?  ALPRAZolam (XANAX) 1 MG tablet, Take 1 mg by mouth daily., Disp: , Rfl:  ?  carbidopa-levodopa (SINEMET IR) 25-100 MG tablet, Take 1 tablet by  mouth 3 (three) times daily., Disp: 90 tablet, Rfl: 3 ?  ezetimibe (ZETIA) 10 MG tablet, Take 10 mg by mouth daily., Disp: , Rfl:  ?  gabapentin (NEURONTIN) 100 MG capsule, Take 1 capsule (100 mg total) by mouth 3 (three) times daily., Disp: 90 capsule, Rfl: 11 ?  hydrOXYzine (ATARAX/VISTARIL) 25 MG tablet, Take 25 mg by mouth every 6 (six) hours as needed for itching., Disp: , Rfl:  ?  lanthanum (FOSRENOL) 1000 MG chewable tablet, Chew 1,000-3,000 mg by mouth See admin instructions. Take 3000 mg with each meal and 1000-2000 mg with each snack, Disp: , Rfl:  ?  metoprolol succinate (TOPROL-XL) 50 MG 24 hr tablet, Take 50 mg by mouth every evening. Take with or immediately following a meal., Disp: , Rfl:  ?  multivitamin (RENA-VIT) TABS tablet, Take 1 tablet by mouth at bedtime. , Disp: , Rfl:  ?  omeprazole (PRILOSEC) 40 MG capsule, Take 40 mg by mouth in the morning and at bedtime., Disp: , Rfl:  ?  ondansetron (ZOFRAN-ODT) 4 MG disintegrating tablet, Take 4 mg by mouth 2 (two) times daily as needed for nausea/vomiting., Disp: , Rfl:  ?  oxyCODONE-acetaminophen (PERCOCET) 5-325 MG tablet, Take 1 tablet by mouth every  6 (six) hours as needed for severe pain., Disp: 20 tablet, Rfl: 0 ?  vortioxetine HBr (TRINTELLIX) 5 MG TABS tablet, Take 5 mg by mouth daily., Disp: , Rfl:  ?  zolpidem (AMBIEN) 5 MG tablet, Take 5 mg by mouth at bedtime., Disp: , Rfl:  ? ?Social History  ? ?Tobacco Use  ?Smoking Status Never  ?Smokeless Tobacco Never  ? ? ?Allergies  ?Allergen Reactions  ? Nsaids Other (See Comments)  ?  Cannot take due to Kidney disease/Kidney function ?  ? Tolmetin Other (See Comments)  ?  Cannot take due to Kidney Disease  ? ?Objective:  ?There were no vitals filed for this visit. ?There is no height or weight on file to calculate BMI. ?Constitutional Well developed. ?Well nourished.  ?Vascular Foot warm and well perfused. ?Capillary refill normal to all digits.   ?Neurologic Normal speech. ?Oriented to person,  place, and time. ?Epicritic sensation to light touch grossly present bilaterally.  ?Dermatologic Skin healing well without signs of infection. Skin edges well coapted without signs of infection.  ?Orthopedic: Tenderness to palpation noted about the surgical site.  ? ?Radiographs: 3 views of skeletally mature adult right foot: Osteotomy noted across the fifth metatarsal as well as the fourth metatarsal.  The stress on the fourth metatarsal may have led to a self osteotomy. ?Assessment:  ? ?1. Plantar flexed metatarsal, right   ?2. Status post foot surgery   ? ?Plan:  ?Patient was evaluated and treated and all questions answered. ? ?S/p foot surgery right ?-Progressing as expected post-operatively. ?-XR: See above weightbearing as ?-WB Status: Weightbearing as tolerated in surgical shoe ?-Sutures: Intact.  No clinical signs of [place] is noted no complication noted. ?-Medications: None ?-Foot redressed. ? ?No follow-ups on file.  ?

## 2021-05-06 NOTE — Progress Notes (Deleted)
POST OPERATIVE OFFICE NOTE    CC:  F/u for surgery  HPI:  This is a 46 y.o. female who is s/p right arm AVF revision, superficialization of BC AVF on 04/14/2021 by Dr. Virl Cagey.  He originally underwent right BC AVF on 02/24/2021 by Dr. Virl Cagey.   She has a TDC that was placed *** by ***.  Pt states ***he does *** have pain/numbness in *** hand.    The pt is on dialysis T/T/S at *** location.   Allergies  Allergen Reactions   Nsaids Other (See Comments)    Cannot take due to Kidney disease/Kidney function    Tolmetin Other (See Comments)    Cannot take due to Kidney Disease    Current Outpatient Medications  Medication Sig Dispense Refill   acetaminophen (TYLENOL) 500 MG tablet Take 1,000 mg by mouth every 6 (six) hours as needed for moderate pain.     allopurinol (ZYLOPRIM) 100 MG tablet Take 100 mg by mouth at bedtime.      ALPRAZolam (XANAX) 1 MG tablet Take 1 mg by mouth daily.     carbidopa-levodopa (SINEMET IR) 25-100 MG tablet Take 1 tablet by mouth 3 (three) times daily. 90 tablet 3   ezetimibe (ZETIA) 10 MG tablet Take 10 mg by mouth daily.     gabapentin (NEURONTIN) 100 MG capsule Take 1 capsule (100 mg total) by mouth 3 (three) times daily. 90 capsule 11   hydrOXYzine (ATARAX/VISTARIL) 25 MG tablet Take 25 mg by mouth every 6 (six) hours as needed for itching.     lanthanum (FOSRENOL) 1000 MG chewable tablet Chew 1,000-3,000 mg by mouth See admin instructions. Take 3000 mg with each meal and 1000-2000 mg with each snack     metoprolol succinate (TOPROL-XL) 50 MG 24 hr tablet Take 50 mg by mouth every evening. Take with or immediately following a meal.     multivitamin (RENA-VIT) TABS tablet Take 1 tablet by mouth at bedtime.      omeprazole (PRILOSEC) 40 MG capsule Take 40 mg by mouth in the morning and at bedtime.     ondansetron (ZOFRAN-ODT) 4 MG disintegrating tablet Take 4 mg by mouth 2 (two) times daily as needed for nausea/vomiting.     oxyCODONE-acetaminophen  (PERCOCET) 5-325 MG tablet Take 1 tablet by mouth every 6 (six) hours as needed for severe pain. 20 tablet 0   vortioxetine HBr (TRINTELLIX) 5 MG TABS tablet Take 5 mg by mouth daily.     zolpidem (AMBIEN) 5 MG tablet Take 5 mg by mouth at bedtime.     No current facility-administered medications for this visit.     ROS:  See HPI  Physical Exam:  ***  Incision:  *** Extremities:   There *** a palpable *** pulse.   Motor and sensory *** in tact.   There *** a thrill/bruit present.  The fistula/graft *** easily palpable     Assessment/Plan:  This is a 46 y.o. female who is s/p: ight arm AVF revision, superficialization of BC AVF on 04/14/2021 by Dr. Virl Cagey.  He originally underwent right BC AVF on 02/24/2021 by Dr. Virl Cagey.    -the pt does *** have evidence of steal. -the fistula/graft can be used ***. -if pt has tunneled catheter, this can be removed at the discretion of the dialysis center once the fistula/graft has been accessed to their satisfaction.  -discussed with pt that access does not last forever and will need intervention or even new access at some point.  -  the pt will follow up ***   Leontine Locket, [facility] MD:  Virl Cagey

## 2021-05-11 ENCOUNTER — Other Ambulatory Visit: Payer: Self-pay | Admitting: Neurology

## 2021-05-12 ENCOUNTER — Other Ambulatory Visit: Payer: Self-pay | Admitting: Neurology

## 2021-05-14 ENCOUNTER — Ambulatory Visit (INDEPENDENT_AMBULATORY_CARE_PROVIDER_SITE_OTHER): Payer: Medicare Other | Admitting: Podiatry

## 2021-05-14 DIAGNOSIS — M216X1 Other acquired deformities of right foot: Secondary | ICD-10-CM

## 2021-05-14 DIAGNOSIS — Z9889 Other specified postprocedural states: Secondary | ICD-10-CM

## 2021-05-14 NOTE — Progress Notes (Signed)
?Subjective:  ?Patient ID: Patricia [name], female    DOB: 02-Apr-1975,  MRN: [id number] ? ?Chief Complaint  ?Patient presents with  ? Routine Post Op  ?  POV #2 DOS 04/23/2021 RT 5TH METATARSAL OSTEOTOMY FLOATING  ? ? ?DOS: 04/23/2021 ?Procedure: Right fifth metatarsal osteotomy ? ?46 y.o. female returns for post-op check.  Patient states that she is doing well.  She states the pain is minimal controllable.  She is weightbearing as tolerated in surgical shoe.  Bandages clean dry and intact ? ?Review of Systems: Negative except as noted in the HPI. Denies N/V/F/Ch. ? ?Past Medical History:  ?Diagnosis Date  ? Acid reflux   ? Anemia 04/2019  ? REQUIRING BLOOD TRANSFUSIONS  ? Anxiety   ? Arthritis   ? CHF (congestive heart failure) ([place])   ? Dyspnea   ? with exertion  ? Dysrhythmia   ? tachycardia  ? ESRD (end stage renal disease) ([place])   ? Bells  ? Facial twitching 11/2020  ? mouth twitch, eyes roll back, neurology started patient on low dose   Sinemet IR.  ? GI bleeding 12/2017  ? Gout   ? Headache(784.0)   ? "related to chemo; sometimes weekly" (09/12/2013)  ? Heart murmur   ? Systolic per Dr Dixie Dials' notes  ? High cholesterol   ? History of blood transfusion "a couple"  ? "related to low counts"  ? Hypertension   ? Iron deficiency anemia   ? "get epogen shots q month" (02/20/2014)  ? MCGN (minimal change glomerulonephritis)   ? "using chemo to tx;  finished my last tx in 12/2013"  ? Neuropathy   ? Peritoneal dialysis status ([place])   ? Stroke [facility]) 08/15/2017  ? ruled out that it was not a stroke  ? ? ?Current Outpatient Medications:  ?  acetaminophen (TYLENOL) 500 MG tablet, Take 1,000 mg by mouth every 6 (six) hours as needed for moderate pain., Disp: , Rfl:  ?  allopurinol (ZYLOPRIM) 100 MG tablet, Take 100 mg by mouth at bedtime. , Disp: , Rfl:  ?  ALPRAZolam (XANAX) 1 MG tablet, Take 1 mg by mouth daily., Disp: , Rfl:  ?  carbidopa-levodopa (SINEMET IR) 25-100 MG tablet, TAKE 1 TABLET BY  MOUTH THREE TIMES DAILY, Disp: 90 tablet, Rfl: 1 ?  ezetimibe (ZETIA) 10 MG tablet, Take 10 mg by mouth daily., Disp: , Rfl:  ?  gabapentin (NEURONTIN) 100 MG capsule, Take 1 capsule (100 mg total) by mouth 3 (three) times daily., Disp: 90 capsule, Rfl: 11 ?  hydrOXYzine (ATARAX/VISTARIL) 25 MG tablet, Take 25 mg by mouth every 6 (six) hours as needed for itching., Disp: , Rfl:  ?  lanthanum (FOSRENOL) 1000 MG chewable tablet, Chew 1,000-3,000 mg by mouth See admin instructions. Take 3000 mg with each meal and 1000-2000 mg with each snack, Disp: , Rfl:  ?  metoprolol succinate (TOPROL-XL) 50 MG 24 hr tablet, Take 50 mg by mouth every evening. Take with or immediately following a meal., Disp: , Rfl:  ?  multivitamin (RENA-VIT) TABS tablet, Take 1 tablet by mouth at bedtime. , Disp: , Rfl:  ?  omeprazole (PRILOSEC) 40 MG capsule, Take 40 mg by mouth in the morning and at bedtime., Disp: , Rfl:  ?  ondansetron (ZOFRAN-ODT) 4 MG disintegrating tablet, Take 4 mg by mouth 2 (two) times daily as needed for nausea/vomiting., Disp: , Rfl:  ?  oxyCODONE-acetaminophen (PERCOCET) 5-325 MG tablet, Take 1 tablet by mouth every 6 (  six) hours as needed for severe pain., Disp: 20 tablet, Rfl: 0 ?  vortioxetine HBr (TRINTELLIX) 5 MG TABS tablet, Take 5 mg by mouth daily., Disp: , Rfl:  ?  zolpidem (AMBIEN) 5 MG tablet, Take 5 mg by mouth at bedtime., Disp: , Rfl:  ? ?Social History  ? ?Tobacco Use  ?Smoking Status Never  ?Smokeless Tobacco Never  ? ? ?Allergies  ?Allergen Reactions  ? Nsaids Other (See Comments)  ?  Cannot take due to Kidney disease/Kidney function ?  ? Tolmetin Other (See Comments)  ?  Cannot take due to Kidney Disease  ? ?Objective:  ?There were no vitals filed for this visit. ?There is no height or weight on file to calculate BMI. ?Constitutional Well developed. ?Well nourished.  ?Vascular Foot warm and well perfused. ?Capillary refill normal to all digits.   ?Neurologic Normal speech. ?Oriented to person, place,  and time. ?Epicritic sensation to light touch grossly present bilaterally.  ?Dermatologic Skin completely epithelialized.  No signs of Deis is noted.  Good correction alignment noted reduction of pressure noted.  ?Orthopedic: No further tenderness to palpation noted about the surgical site.  ? ?Radiographs: 3 views of skeletally mature adult right foot: Osteotomy noted across the fifth metatarsal as well as the fourth metatarsal.  The stress on the fourth metatarsal may have led to a self osteotomy. ?Assessment:  ? ?No diagnosis found. ? ?Plan:  ?Patient was evaluated and treated and all questions answered. ? ?S/p foot surgery right ?-Progressing as expected post-operatively. ?-XR: See above weightbearing as ?-WB Status: Transition to weightbearing as tolerated in regular shoes ?-Sutures: Removed no clinical signs of Deis is noted no complication noted. ?-Medications: None ?-Foot redressed. ? ?No follow-ups on file.  ?

## 2021-05-26 ENCOUNTER — Ambulatory Visit: Payer: Medicare Other | Admitting: Neurology

## 2021-05-26 ENCOUNTER — Encounter: Payer: Self-pay | Admitting: Neurology

## 2021-05-26 VITALS — BP 129/81 | HR 71 | Ht 67.0 in | Wt 245.0 lb

## 2021-05-26 DIAGNOSIS — G245 Blepharospasm: Secondary | ICD-10-CM

## 2021-05-26 DIAGNOSIS — M792 Neuralgia and neuritis, unspecified: Secondary | ICD-10-CM | POA: Diagnosis not present

## 2021-05-26 DIAGNOSIS — G63 Polyneuropathy in diseases classified elsewhere: Secondary | ICD-10-CM

## 2021-05-26 MED ORDER — DICLOFENAC SODIUM 1 % EX CREA: TOPICAL_CREAM | CUTANEOUS | 11 refills | Status: DC

## 2021-05-26 MED ORDER — DULOXETINE HCL 30 MG PO CPEP: 30.0000 mg | ORAL_CAPSULE | Freq: Every day | ORAL | 11 refills | Status: DC

## 2021-05-26 MED ORDER — LIDOCAINE-PRILOCAINE 2.5-2.5 % EX CREA
TOPICAL_CREAM | CUTANEOUS | 11 refills | Status: DC
Start: 2021-05-26 — End: 2022-12-31

## 2021-05-26 MED ORDER — GABAPENTIN 100 MG PO CAPS: 300.0000 mg | ORAL_CAPSULE | Freq: Three times a day (TID) | ORAL | 11 refills | Status: DC

## 2021-05-26 NOTE — Progress Notes (Signed)
Xeomin 50 units x 1 vial [id number] [id number] Exp-01-2022 sample

## 2021-05-26 NOTE — Patient Instructions (Signed)
Meds ordered this encounter  Medications   DULoxetine (CYMBALTA) 30 MG capsule    Sig: Take 1 capsule (30 mg total) by mouth daily.    Dispense:  30 capsule    Refill:  11   gabapentin (NEURONTIN) 100 MG capsule    Sig: Take 3 capsules (300 mg total) by mouth 3 (three) times daily.    Dispense:  270 capsule    Refill:  11   lidocaine-prilocaine (EMLA) cream    Sig: 1gram tid prn    Dispense:  30 g    Refill:  11   Diclofenac Sodium 1 % CREA    Sig: 1 gram qid prn    Dispense:  120 g    Refill:  11

## 2021-05-26 NOTE — Progress Notes (Signed)
Chief Complaint  Patient presents with   Follow-up    Rm 17. Alone. Dystonia f/u. Discuss Xeomin. C/o worsening neuropathy symptoms.      ASSESSMENT AND PLAN  Patricia [name] is a 46 y.o. 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  Gradual onset abnormal facial movement  In the setting of end-stage renal disease, on dialysis, could indicate bilateral basal ganglion structural versus mineral deposit abnormalities  Personally reviewed MRI of brain in January 2023 that was normal  Laboratory evaluation showed significantly elevated ferritin 2832, iron 176, saturation 68%,  No benefit from short trial of Sinemet 25/100 3 times daily   Today is her 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   MEDICAL HISTORY:  Patricia [name] is a 46 year old female, seen in request by her PA [name] for evaluation of eye facial twitching and eye rolling movement, she is accompanied by her daughter at today's visit on January 13, 2021  I reviewed and  summarized the referring note. PMHx. 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 since worsening renal disease, went on dialysis, since 2018, she also was put on Trintellix for depression since November 2022, it does help her depression, but she ran out of the supply over the past few weeks, there was no significant change in her abnormal facial movement  Her abnormal facial movements started around November 2022, initially she has frequent eye rolling back movement, herself did not aware of it, was noticed by her daughter, later she also developed squinting facial movement, frequent blinking, no passing out,  She denies significant gait abnormality,  UPDATE May 26 2021: She complains that her hands and feet paresthesia, burning pain, bothering her all day, especially at nighttime, tried gabapentin up to 100 mg 3 times a day, without significant side effect,   Laboratory evaluation for evaluation of peripheral neuropathy in 2023 showed normal negative TSH, CPK, folic acid, P9J was mildly elevated 5.8, mild elevated C-reactive protein 12, ESR of 49, normal copper, ANA, ceruloplasmin, zinc, creatinine was 11, potassium 5.6, ferritin level was significantly elevated 2822, with elevated iron saturation 68, CBC showed hemoglobin of 9.7, RDW was 19.3, elevated Epstein-Barr virus IgG, VZV antibody, measle antibody negative RPR HIV  This is her first pain EMG guided xeomin injection for blepharospasm  PHYSICAL EXAM:   Vitals:   05/26/21 0848  BP: 129/81  Pulse: 71  Weight: 245 lb (111.1 kg)  Height: _0  (1.702 m)   Body mass index is 38.37 kg/m.  PHYSICAL EXAMNIATION:  Gen: NAD,  conversant, well nourised, well groomed                     Cardiovascular: Regular rate rhythm, no peripheral edema, warm, nontender. Eyes: Conjunctivae clear without exudates or hemorrhage Neck: Supple, no carotid bruits. Pulmonary: Clear to  auscultation bilaterally   NEUROLOGICAL EXAM:  MENTAL STATUS: She has almost constant eye blinking, squinting movement, eye rolling backwards, Speech/cognition: Awake, alert, oriented in history taking and casual conversation   CRANIAL NERVES: CN II: Visual fields are full to confrontation. Pupils are round equal and briskly reactive to light. CN III, IV, VI: extraocular movement are normal. No ptosis. CN V: Facial sensation is intact to light touch CN VII: Face is symmetric with normal eye closure  CN VIII: Hearing is normal to causal conversation. CN IX, X: Phonation is normal. CN XI: Head turning and shoulder shrug are intact  MOTOR: Normal motor strength, left chest dialysis access, right lateral plantar surface callus, right foot swelling  REFLEXES: Reflexes are 1 and symmetric at the biceps, triceps, knees, and absent at ankles. Plantar responses are flexor.  SENSORY: Length-dependent decreased light touch, pinprick and vibratory sensation to mid shin level  COORDINATION: There is no trunk or limb dysmetria noted.  GAIT/STANCE: Need push-up to get up from sitting position, mildly antalgic,  REVIEW OF SYSTEMS:  Full 14 system review of systems performed and notable only for as above All other review of systems were negative.   ALLERGIES: Allergies  Allergen Reactions   Nsaids Other (See Comments)    Cannot take due to Kidney disease/Kidney function    Tolmetin Other (See Comments)    Cannot take due to Kidney Disease    HOME MEDICATIONS: Current Outpatient Medications  Medication Sig Dispense Refill   acetaminophen (TYLENOL) 500 MG tablet Take 1,000 mg by mouth every 6 (six) hours as needed for moderate pain.     allopurinol (ZYLOPRIM) 100 MG tablet Take 100 mg by mouth at bedtime.      ALPRAZolam (XANAX) 1 MG tablet Take 1 mg by mouth daily.     carbidopa-levodopa (SINEMET IR) 25-100 MG tablet TAKE 1 TABLET BY MOUTH THREE TIMES DAILY 90 tablet 1   ezetimibe  (ZETIA) 10 MG tablet Take 10 mg by mouth daily.     gabapentin (NEURONTIN) 100 MG capsule Take 1 capsule (100 mg total) by mouth 3 (three) times daily. 90 capsule 11   hydrOXYzine (ATARAX/VISTARIL) 25 MG tablet Take 25 mg by mouth every 6 (six) hours as needed for itching.     lanthanum (FOSRENOL) 1000 MG chewable tablet Chew 1,000-3,000 mg by mouth See admin instructions. Take 3000 mg with each meal and 1000-2000 mg with each snack     metoprolol succinate (TOPROL-XL) 50 MG 24 hr tablet Take 50 mg by mouth every evening. Take with or immediately following a meal.     multivitamin (RENA-VIT) TABS tablet Take 1 tablet by mouth at bedtime.      omeprazole (PRILOSEC) 40 MG capsule Take 40 mg by mouth in the morning and at bedtime.     ondansetron (ZOFRAN-ODT) 4 MG disintegrating tablet Take 4 mg by mouth 2 (two) times daily as needed for nausea/vomiting.     vortioxetine HBr (TRINTELLIX) 5 MG TABS tablet Take 5 mg by mouth daily.     zolpidem (AMBIEN) 5 MG tablet Take 5 mg by mouth at bedtime.     No current facility-administered medications for this visit.    PAST MEDICAL HISTORY:  Past Medical History:  Diagnosis Date   Acid reflux    Anemia 04/2019   REQUIRING BLOOD TRANSFUSIONS   Anxiety    Arthritis    CHF (congestive heart failure) ([facility])    Dyspnea    with exertion   Dysrhythmia    tachycardia   ESRD (end stage renal disease) ([place])    [facility] [place]   Facial twitching 11/2020   mouth twitch, eyes roll back, neurology started patient on low dose   Sinemet IR.   GI bleeding 12/2017   Gout    Headache(784.0)    "related to chemo; sometimes weekly" (09/12/2013)   Heart murmur    Systolic per Dr Dixie Dials' notes   High cholesterol    History of blood transfusion "a couple"   "related to low counts"   Hypertension    Iron deficiency anemia    "get epogen shots q month" (02/20/2014)   MCGN (minimal change glomerulonephritis)    "using chemo to tx;  finished my last tx in  12/2013"   Neuropathy    Peritoneal dialysis status ([place])    Stroke ([place]) 08/15/2017   ruled out that it was not a stroke    PAST SURGICAL HISTORY: Past Surgical History:  Procedure Laterality Date   ABDOMINAL HYSTERECTOMY  2010   "laparoscopic"   ANKLE FRACTURE SURGERY Right 1994   AV FISTULA PLACEMENT Left 04/14/2016   Procedure: ARTERIOVENOUS (AV) FISTULA CREATION;  Surgeon: Angelia Mould, MD;  Location: [place];  Service: Vascular;  Laterality: Left;   AV FISTULA PLACEMENT Left 08/09/2017   Procedure: INSERTION OF ARTERIOVENOUS (AV) GORE-TEX GRAFT LEFT ARM;  Surgeon: Elam Dutch, MD;  Location: [place];  Service: Vascular;  Laterality: Left;   AV FISTULA PLACEMENT Right 03/15/2020   Procedure: RIGHT ARM ARTERIOVENOUS (AV) FISTULA CREATION 1st Stage Basilic Transposition;  Surgeon: Serafina Mitchell, MD;  Location: [place];  Service: Vascular;  Laterality: Right;   AV FISTULA PLACEMENT Right 02/24/2021   Procedure: RIGHT ARM ARTERIOVENOUS  FISTULA CREATION;  Surgeon: Broadus John, MD;  Location: [place];  Service: Vascular;  Laterality: Right;  regional block   [place] Left 08/11/2017   Procedure: REMOVAL OF ARTERIOVENOUS GORETEX GRAFT ([place]);  Surgeon: Serafina Mitchell, MD;  Location: [facility] OR;  Service: Vascular;  Laterality: Left;   [place] Right 05/17/2020   Procedure: RIGHT UPPER EXTREMITY SECOND STAGE [place];  Surgeon: Serafina Mitchell, MD;  Location: [place];  Service: Vascular;  Laterality: Right;   BIOPSY  09/03/2017   Procedure: BIOPSY;  Surgeon: Otis Brace, MD;  Location: [place] ENDOSCOPY;  Service: Gastroenterology;;   BIOPSY  12/24/2017   Procedure: BIOPSY;  Surgeon: Irving Copas., MD;  Location: [place];  Service: Gastroenterology;;   CARDIAC CATHETERIZATION  2000's   COLONOSCOPY WITH PROPOFOL N/A 09/04/2017   Procedure: COLONOSCOPY WITH PROPOFOL;  Surgeon: Ronnette Juniper, MD;  Location: [place];  Service:  Gastroenterology;  Laterality: N/A;   ENTEROSCOPY N/A 12/24/2017   Procedure: ENTEROSCOPY;  Surgeon: Rush Landmark Telford Nab., MD;  Location: [place];  Service: Gastroenterology;  Laterality: N/A;   ESOPHAGOGASTRODUODENOSCOPY     ESOPHAGOGASTRODUODENOSCOPY (EGD) WITH PROPOFOL Left 06/04/2017   Procedure: ESOPHAGOGASTRODUODENOSCOPY (EGD) WITH PROPOFOL;  Surgeon: Arta Silence, MD;  Location: [place];  Service: Endoscopy;  Laterality: Left;   ESOPHAGOGASTRODUODENOSCOPY (EGD) WITH PROPOFOL N/A 09/03/2017   Procedure: ESOPHAGOGASTRODUODENOSCOPY (EGD) WITH PROPOFOL;  Surgeon: Otis Brace, MD;  Location: [place];  Service: Gastroenterology;  Laterality:  N/A;   FISTULA SUPERFICIALIZATION Left 04/02/2017   Procedure: FISTULA SUPERFICIALIZATION LEFT ARM;  Surgeon: Nada Libman, MD;  Location: [facility] OR;  Service: Vascular;  Laterality: Left;   FISTULA SUPERFICIALIZATION Right 04/14/2021   Procedure: RIGHT ARM FISTULA SUPERFICIALIZATION;  Surgeon: Victorino Sparrow, MD;  Location: [facility] OR;  Service: Vascular;  Laterality: Right;  PERIPHERAL NERVE BLOCK   FISTULOGRAM Left 04/07/2016   Procedure: FISTULOGRAM;  Surgeon: Maeola Harman, MD;  Location: [facility] OR;  Service: Vascular;  Laterality: Left;   FRACTURE SURGERY     right ankle   GIVENS CAPSULE STUDY N/A 10/29/2017   Procedure: GIVENS CAPSULE STUDY;  Surgeon: Charlott Rakes, MD;  Location: [facility] ENDOSCOPY;  Service: Endoscopy;  Laterality: N/A;   GIVENS CAPSULE STUDY N/A 12/24/2017   Procedure: GIVENS CAPSULE STUDY;  Surgeon: Lemar Lofty., MD;  Location: [facility] ENDOSCOPY;  Service: Gastroenterology;  Laterality: N/A;   GIVENS CAPSULE STUDY N/A 05/22/2018   Procedure: GIVENS CAPSULE STUDY;  Surgeon: Lemar Lofty., MD;  Location: [facility] ENDOSCOPY;  Service: Gastroenterology;  Laterality: N/A;   INSERTION OF DIALYSIS CATHETER Right 04/07/2016   Procedure: INSERTION OF DIALYSIS CATHETER;  Surgeon: Maeola Harman,  MD;  Location: [facility] OR;  Service: Vascular;  Laterality: Right;   INSERTION OF DIALYSIS CATHETER Right 04/04/2017   Procedure: INSERTION OF DIALYSIS CATHETER;  Surgeon: Maeola Harman, MD;  Location: [facility] OR;  Service: Vascular;  Laterality: Right;   IR FLUORO GUIDE CV LINE LEFT  03/13/2020   IR US GUIDE VASC ACCESS LEFT  03/13/2020   LEFT HEART CATH AND CORONARY ANGIOGRAPHY N/A 05/12/2019   Procedure: LEFT HEART CATH AND CORONARY ANGIOGRAPHY;  Surgeon: Orpah Cobb, MD;  [facility]: [facility] INVASIVE CV LAB;  Service: Cardiovascular;  Laterality: N/A;   LIGATION OF ARTERIOVENOUS  FISTULA Left 04/14/2016   Procedure: LIGATION OF ARTERIOVENOUS  FISTULA;  Surgeon: Chuck Hint, MD;  Location: [facility] OR;  Service: Vascular;  Laterality: Left;   LIGATION OF COMPETING BRANCHES OF ARTERIOVENOUS FISTULA Right 05/17/2020   Procedure: LIGATION OF COMPETING BRANCHES OF RIGHT ARM ARTERIOVENOUS FISTULA;  Surgeon: Nada Libman, MD;  Location: [facility] OR;  Service: Vascular;  Laterality: Right;   PATCH ANGIOPLASTY Left 06/19/2016   Procedure: PATCH ANGIOPLASTY LEFT ARTERIOVENOUS FISTULA;  Surgeon: Chuck Hint, MD;  Location: [facility] OR;  Service: Vascular;  Laterality: Left;   POLYPECTOMY  09/04/2017   Procedure: POLYPECTOMY;  Surgeon: Kerin Salen, MD;  Location: [facility] ENDOSCOPY;  Service: Gastroenterology;;   REVISON OF ARTERIOVENOUS FISTULA Left 06/19/2016   Procedure: REVISION SUPERFICIALIZATION OF BRACHIOCEPHALIC ARTERIOVENOUS FISTULA;  Surgeon: Chuck Hint, MD;  Location: [facility] OR;  Service: Vascular;  Laterality: Left;   UPPER EXTREMITY VENOGRAPHY Bilateral 02/19/2021   Procedure: UPPER EXTREMITY VENOGRAPHY;  Surgeon: Victorino Sparrow, MD;  Location: [facility] INVASIVE CV LAB;  Service: Cardiovascular;  Laterality: Bilateral;    FAMILY HISTORY: Family History  Problem Relation Age of Onset   Hypertension Mother    Thyroid disease Mother    Coronary artery disease Father    Hypertension Father     Diabetes Father    Colon cancer Maternal Aunt    Esophageal cancer Maternal Uncle    Inflammatory bowel disease Neg Hx    Liver disease Neg Hx    Pancreatic cancer Neg Hx    Rectal cancer Neg Hx    Stomach cancer Neg Hx     SOCIAL HISTORY: Social History   Socioeconomic History   Marital status: Single  Spouse name: Not on file   Number of children: 3   Years of education: 14   Highest education level: Associate degree: academic program  Occupational History   Occupation: disabled  Tobacco Use   Smoking status: Never   Smokeless tobacco: Never  Vaping Use   Vaping Use: Never used  Substance and Sexual Activity   Alcohol use: No   Drug use: Never   Sexual activity: Not Currently    Birth control/protection: Surgical  Other Topics Concern   Not on file  Social History Narrative   Not on file   Social Determinants of Health   Financial Resource Strain: Not on file  Food Insecurity: Not on file  Transportation Needs: Not on file  Physical Activity: Not on file  Stress: Not on file  Social Connections: Not on file  Intimate Partner Violence: Not on file      Marcial Pacas, M.D. Ph.D.  [facility] [address] [place], [place] [address] Ph: [phone] Fax: [phone]  CC:  Barrie Lyme [address] [place],  [place] [address]  Doyle Askew, PA-C

## 2021-05-27 MED ORDER — XEOMIN 50 UNITS IM SOLR: 50.0000 [IU] | INTRAMUSCULAR | 3 refills | Status: DC

## 2021-05-27 NOTE — Progress Notes (Unsigned)
Office Note    HPI: Patricia [name] is a Right handed 46 y.o. (Jan 11, 1975) female who presents status post 04/14/21 right-sided brachiocephalic fistula superficialization.    Exam, Patricia [name] was doing well.  She had no complaints.  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  This is not worsened with her fistula creation.  Continues to dialyze Tuesday Thursday Saturday through a tunneled HD line.  Denies wound healing concerns, drainage, erythema, induration.   The pt is on a statin for cholesterol management.  The pt is not on a daily aspirin.   Other AC:   The pt is  on medications for hypertension.   The pt is not diabetic. Tobacco hx:  never  Past Medical History:  Diagnosis Date   Acid reflux    Anemia 04/2019   REQUIRING BLOOD TRANSFUSIONS   Anxiety    Arthritis    CHF (congestive heart failure) (HCC)    Dyspnea    with exertion   Dysrhythmia    tachycardia   ESRD (end stage renal disease) ([place])    [facility]   Facial twitching 11/2020   mouth twitch, eyes roll back, neurology started patient on low dose   Sinemet IR.   GI bleeding 12/2017   Gout    Headache(784.0)    "related to chemo; sometimes weekly" (09/12/2013)   Heart murmur    Systolic per Dr Dixie Dials' notes   High cholesterol    History of blood transfusion "a couple"   "related to low counts"   Hypertension    Iron deficiency anemia    "get epogen shots q month" (02/20/2014)   MCGN (minimal change glomerulonephritis)    "using chemo to tx;  finished my last tx in 12/2013"   Neuropathy    Peritoneal dialysis status ([place])    Stroke ([place]) 08/15/2017   ruled out that it was not a stroke    Past Surgical History:  Procedure Laterality Date   ABDOMINAL HYSTERECTOMY  2010   "laparoscopic"   ANKLE FRACTURE SURGERY Right 1994   AV FISTULA PLACEMENT Left 04/14/2016   Procedure: ARTERIOVENOUS (AV) FISTULA CREATION;  Surgeon: Angelia Mould, MD;  Location: [place];   Service: Vascular;  Laterality: Left;   AV FISTULA PLACEMENT Left 08/09/2017   Procedure: INSERTION OF ARTERIOVENOUS (AV) GORE-TEX GRAFT LEFT ARM;  Surgeon: Elam Dutch, MD;  Location: [place];  Service: Vascular;  Laterality: Left;   AV FISTULA PLACEMENT Right 03/15/2020   Procedure: RIGHT ARM ARTERIOVENOUS (AV) FISTULA CREATION 1st Stage Basilic Transposition;  Surgeon: Serafina Mitchell, MD;  Location: [place];  Service: Vascular;  Laterality: Right;   AV FISTULA PLACEMENT Right 02/24/2021   Procedure: RIGHT ARM ARTERIOVENOUS  FISTULA CREATION;  Surgeon: Broadus John, MD;  Location: [place];  Service: Vascular;  Laterality: Right;  regional block   AVGG REMOVAL Left 08/11/2017   Procedure: REMOVAL OF ARTERIOVENOUS GORETEX GRAFT ([place]);  Surgeon: Serafina Mitchell, MD;  Location: [facility];  Service: Vascular;  Laterality: Left;   [place] Right 05/17/2020   Procedure: RIGHT UPPER EXTREMITY SECOND STAGE [place];  Surgeon: Serafina Mitchell, MD;  Location: [place];  Service: Vascular;  Laterality: Right;   BIOPSY  09/03/2017   Procedure: BIOPSY;  Surgeon: Otis Brace, MD;  Location: [place];  Service: Gastroenterology;;   BIOPSY  12/24/2017   Procedure: BIOPSY;  Surgeon: Irving Copas., MD;  Location: [place];  Service: Gastroenterology;;   CARDIAC CATHETERIZATION  2000's   COLONOSCOPY WITH PROPOFOL N/A 09/04/2017   Procedure: COLONOSCOPY WITH PROPOFOL;  Surgeon: Ronnette Juniper, MD;  Location: [place];  Service: Gastroenterology;  Laterality: N/A;   ENTEROSCOPY N/A 12/24/2017   Procedure: ENTEROSCOPY;  Surgeon: Rush Landmark Telford Nab., MD;  Location: [place];  Service: Gastroenterology;  Laterality: N/A;   ESOPHAGOGASTRODUODENOSCOPY     ESOPHAGOGASTRODUODENOSCOPY (EGD) WITH PROPOFOL Left 06/04/2017   Procedure: ESOPHAGOGASTRODUODENOSCOPY (EGD) WITH PROPOFOL;  Surgeon: Arta Silence, MD;  Location: [place];  Service: Endoscopy;   Laterality: Left;   ESOPHAGOGASTRODUODENOSCOPY (EGD) WITH PROPOFOL N/A 09/03/2017   Procedure: ESOPHAGOGASTRODUODENOSCOPY (EGD) WITH PROPOFOL;  Surgeon: Otis Brace, MD;  Location: [place];  Service: Gastroenterology;  Laterality: N/A;   FISTULA SUPERFICIALIZATION Left 04/02/2017   Procedure: FISTULA SUPERFICIALIZATION LEFT ARM;  Surgeon: Serafina Mitchell, MD;  Location: [place];  Service: Vascular;  Laterality: Left;   FISTULA SUPERFICIALIZATION Right 04/14/2021   Procedure: RIGHT ARM FISTULA SUPERFICIALIZATION;  Surgeon: Broadus John, MD;  Location: [place];  Service: Vascular;  Laterality: Right;  PERIPHERAL NERVE BLOCK   FISTULOGRAM Left 04/07/2016   Procedure: FISTULOGRAM;  Surgeon: Waynetta Sandy, MD;  Location: [place];  Service: Vascular;  Laterality: Left;   FRACTURE SURGERY     right ankle   GIVENS CAPSULE STUDY N/A 10/29/2017   Procedure: GIVENS CAPSULE STUDY;  Surgeon: Wilford Corner, MD;  Location: [place];  Service: Endoscopy;  Laterality: N/A;   GIVENS CAPSULE STUDY N/A 12/24/2017   Procedure: GIVENS CAPSULE STUDY;  Surgeon: Irving Copas., MD;  Location: [place];  Service: Gastroenterology;  Laterality: N/A;   GIVENS CAPSULE STUDY N/A 05/22/2018   Procedure: GIVENS CAPSULE STUDY;  Surgeon: Irving Copas., MD;  Location: [place];  Service: Gastroenterology;  Laterality: N/A;   INSERTION OF DIALYSIS CATHETER Right 04/07/2016   Procedure: INSERTION OF DIALYSIS CATHETER;  Surgeon: Waynetta Sandy, MD;  Location: [place];  Service: Vascular;  Laterality: Right;   INSERTION OF DIALYSIS CATHETER Right 04/04/2017   Procedure: INSERTION OF DIALYSIS CATHETER;  Surgeon: Waynetta Sandy, MD;  Location: [employer];  Service: Vascular;  Laterality: Right;   IR FLUORO GUIDE CV LINE LEFT  03/13/2020   IR US GUIDE VASC ACCESS LEFT  03/13/2020   LEFT HEART CATH AND CORONARY ANGIOGRAPHY N/A 05/12/2019   Procedure: LEFT HEART CATH AND CORONARY  ANGIOGRAPHY;  Surgeon: Dixie Dials, MD;  Location: [place] CV LAB;  Service: Cardiovascular;  Laterality: N/A;   LIGATION OF ARTERIOVENOUS  FISTULA Left 04/14/2016   Procedure: LIGATION OF ARTERIOVENOUS  FISTULA;  Surgeon: Angelia Mould, MD;  Location: [place];  Service: Vascular;  Laterality: Left;   LIGATION OF COMPETING BRANCHES OF ARTERIOVENOUS FISTULA Right 05/17/2020   Procedure: LIGATION OF COMPETING BRANCHES OF RIGHT ARM ARTERIOVENOUS FISTULA;  Surgeon: Serafina Mitchell, MD;  Location: [name];  Service: Vascular;  Laterality: Right;   PATCH ANGIOPLASTY Left 06/19/2016   Procedure: PATCH ANGIOPLASTY LEFT ARTERIOVENOUS FISTULA;  Surgeon: Angelia Mould, MD;  Location: [place];  Service: Vascular;  Laterality: Left;   POLYPECTOMY  09/04/2017   Procedure: POLYPECTOMY;  Surgeon: Ronnette Juniper, MD;  Location: [facility] ENDOSCOPY;  Service: Gastroenterology;;   REVISON OF ARTERIOVENOUS FISTULA Left 06/19/2016   Procedure: REVISION SUPERFICIALIZATION OF BRACHIOCEPHALIC ARTERIOVENOUS FISTULA;  Surgeon: Angelia Mould, MD;  Location: [place];  Service: Vascular;  Laterality: Left;   UPPER EXTREMITY VENOGRAPHY Bilateral 02/19/2021   Procedure: UPPER EXTREMITY VENOGRAPHY;  Surgeon: Broadus John, MD;  Location: [place]  INVASIVE CV LAB;  Service: Cardiovascular;  Laterality: Bilateral;    Social History   Socioeconomic History   Marital status: Single    Spouse name: Not on file   Number of children: 3   Years of education: 14   Highest education level: Associate degree: academic program  Occupational History   Occupation: disabled  Tobacco Use   Smoking status: Never   Smokeless tobacco: Never  Vaping Use   Vaping Use: Never used  Substance and Sexual Activity   Alcohol use: No   Drug use: Never   Sexual activity: Not Currently    Birth control/protection: Surgical  Other Topics Concern   Not on file  Social History Narrative   Not on file   Social Determinants of Health    Financial Resource Strain: Not on file  Food Insecurity: Not on file  Transportation Needs: Not on file  Physical Activity: Not on file  Stress: Not on file  Social Connections: Not on file  Intimate Partner Violence: Not on file    Family History  Problem Relation Age of Onset   Hypertension Mother    Thyroid disease Mother    Coronary artery disease Father    Hypertension Father    Diabetes Father    Colon cancer Maternal Aunt    Esophageal cancer Maternal Uncle    Inflammatory bowel disease Neg Hx    Liver disease Neg Hx    Pancreatic cancer Neg Hx    Rectal cancer Neg Hx    Stomach cancer Neg Hx     Current Outpatient Medications  Medication Sig Dispense Refill   acetaminophen (TYLENOL) 500 MG tablet Take 1,000 mg by mouth every 6 (six) hours as needed for moderate pain.     allopurinol (ZYLOPRIM) 100 MG tablet Take 100 mg by mouth at bedtime.      ALPRAZolam (XANAX) 1 MG tablet Take 1 mg by mouth daily.     carbidopa-levodopa (SINEMET IR) 25-100 MG tablet TAKE 1 TABLET BY MOUTH THREE TIMES DAILY 90 tablet 1   Diclofenac Sodium 1 % CREA 1 gram qid prn 120 g 11   DULoxetine (CYMBALTA) 30 MG capsule Take 1 capsule (30 mg total) by mouth daily. 30 capsule 11   ezetimibe (ZETIA) 10 MG tablet Take 10 mg by mouth daily.     gabapentin (NEURONTIN) 100 MG capsule Take 3 capsules (300 mg total) by mouth 3 (three) times daily. 270 capsule 11   hydrOXYzine (ATARAX/VISTARIL) 25 MG tablet Take 25 mg by mouth every 6 (six) hours as needed for itching.     incobotulinumtoxinA (XEOMIN) 50 units SOLR injection Inject 50 Units into the muscle every 3 (three) months. 1 each 3   lanthanum (FOSRENOL) 1000 MG chewable tablet Chew 1,000-3,000 mg by mouth See admin instructions. Take 3000 mg with each meal and 1000-2000 mg with each snack     lidocaine-prilocaine (EMLA) cream 1gram tid prn 30 g 11   metoprolol succinate (TOPROL-XL) 50 MG 24 hr tablet Take 50 mg by mouth every evening. Take  with or immediately following a meal.     multivitamin (RENA-VIT) TABS tablet Take 1 tablet by mouth at bedtime.      omeprazole (PRILOSEC) 40 MG capsule Take 40 mg by mouth in the morning and at bedtime.     ondansetron (ZOFRAN-ODT) 4 MG disintegrating tablet Take 4 mg by mouth 2 (two) times daily as needed for nausea/vomiting.     vortioxetine HBr (TRINTELLIX) 5 MG TABS tablet  Take 5 mg by mouth daily.     zolpidem (AMBIEN) 5 MG tablet Take 5 mg by mouth at bedtime.     No current facility-administered medications for this visit.    Allergies  Allergen Reactions   Nsaids Other (See Comments)    Cannot take due to Kidney disease/Kidney function    Tolmetin Other (See Comments)    Cannot take due to Kidney Disease     REVIEW OF SYSTEMS:   '[X]'$  denotes positive finding, $RemoveBeforeDEI'[ ]'[id number]$  denotes negative finding Cardiac  Comments:  Chest pain or chest pressure:    Shortness of breath upon exertion:    Short of breath when lying flat:    Irregular heart rhythm:        Vascular    Pain in calf, thigh, or hip brought on by ambulation:    Pain in feet at night that wakes you up from your sleep:     Blood clot in your veins:    Leg swelling:         Pulmonary    Oxygen at home:    Productive cough:     Wheezing:         Neurologic    Sudden weakness in arms or legs:     Sudden numbness in arms or legs:     Sudden onset of difficulty speaking or slurred speech:    Temporary loss of vision in one eye:     Problems with dizziness:         Gastrointestinal    Blood in stool:     Vomited blood:         Genitourinary    Burning when urinating:     Blood in urine:        Psychiatric    Major depression:         Hematologic    Bleeding problems:    Problems with blood clotting too easily:        Skin    Rashes or ulcers:        Constitutional    Fever or chills:      PHYSICAL EXAMINATION:  There were no vitals filed for this visit.   General:  WDWN in NAD; vital signs  documented above Gait: Not observed HENT: WNL, normocephalic Pulmonary: normal non-labored breathing  Cardiac: regular HR,  Abdomen: soft, NT, no masses Skin: without rashes Vascular Exam/Pulses:  Right Left  Radial 2+ (normal) 2+ (normal)  Ulnar 2+ (normal) 2+ (normal)                   Extremities: without ischemic changes, without Gangrene , without cellulitis; without open wounds;  Musculoskeletal: no muscle wasting or atrophy  Neurologic: A&O X 3;  No focal weakness or paresthesias are detected Psychiatric:  The pt has Normal affect.   Non-Invasive Vascular Imaging:    -   ASSESSMENT/PLAN:  Patricia [name] is a 46 y.o. female who presents status post 04/14/21 right-sided brachiocephalic fistula superficialization.     Patient has a palpable thrill in the right arm, with a 2+ radial pulse.  She does note symptoms consistent with mild steal symptoms, but has a history of baseline neuropathy in feet and hands.  The numbness has not worsened with superficialization.  Physical exam, there is a palpable thrill in the right upper extremity.   Patricia [name] would benefit from right upper extremity fistula ultrasound in an effort to ensure adequate flow, size, depth prior to access.  I will call her with these results.    Broadus John, MD [facility] [phone]

## 2021-05-27 NOTE — Telephone Encounter (Signed)
Good Morning ladies, since this patient was injected on yesterday with an office stock of Xeomin, we have got to replace that as soon as possible. I need for someone to send in her Xeomin Rx to [facility] Thank you

## 2021-05-27 NOTE — Addendum Note (Signed)
Addended by: Noberto Retort C on: 05/27/2021 10:23 AM   Modules accepted: Orders

## 2021-05-29 ENCOUNTER — Ambulatory Visit (INDEPENDENT_AMBULATORY_CARE_PROVIDER_SITE_OTHER): Payer: Medicare Other | Admitting: Vascular Surgery

## 2021-05-29 ENCOUNTER — Encounter: Payer: Self-pay | Admitting: Vascular Surgery

## 2021-05-29 VITALS — BP 148/85 | HR 70 | Temp 98.5°F | Resp 20 | Ht 67.0 in | Wt 240.0 lb

## 2021-05-29 DIAGNOSIS — Z992 Dependence on renal dialysis: Secondary | ICD-10-CM

## 2021-05-29 DIAGNOSIS — N186 End stage renal disease: Secondary | ICD-10-CM

## 2021-06-03 ENCOUNTER — Other Ambulatory Visit: Payer: Self-pay | Admitting: *Deleted

## 2021-06-03 DIAGNOSIS — N186 End stage renal disease: Secondary | ICD-10-CM

## 2021-06-04 ENCOUNTER — Other Ambulatory Visit: Payer: Self-pay

## 2021-06-04 DIAGNOSIS — N186 End stage renal disease: Secondary | ICD-10-CM

## 2021-06-09 ENCOUNTER — Ambulatory Visit (HOSPITAL_COMMUNITY)
Admission: RE | Admit: 2021-06-09 | Discharge: 2021-06-09 | Disposition: A | Discharge status: Home / Self Care | Payer: Medicare Other | Source: Ambulatory Visit | Attending: Surgery | Admitting: Surgery

## 2021-06-09 DIAGNOSIS — N186 End stage renal disease: Secondary | ICD-10-CM

## 2021-06-09 DIAGNOSIS — Z992 Dependence on renal dialysis: Secondary | ICD-10-CM | POA: Diagnosis not present

## 2021-06-10 ENCOUNTER — Ambulatory Visit (INDEPENDENT_AMBULATORY_CARE_PROVIDER_SITE_OTHER): Payer: Medicare Other | Admitting: Vascular Surgery

## 2021-06-10 DIAGNOSIS — Z992 Dependence on renal dialysis: Secondary | ICD-10-CM

## 2021-06-10 DIAGNOSIS — N186 End stage renal disease: Secondary | ICD-10-CM

## 2021-06-10 NOTE — Progress Notes (Signed)
Office Note    Patient called regarding results of recent fistula ultrasound.  Ultrasound demonstrated sufficient size, depth, flow for hemodialysis.  There is an area of focal stenosis.  No plan for intervention at this time.  Recommend access at this time.   HPI: Patricia [name] is a Right handed 46 y.o. (1975-06-26) female who presents status post 04/14/21 right-sided brachiocephalic fistula superficialization.    Exam, Patricia [name] was doing well.  Patricia [name] had no complaints.  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  This is not worsened with Patricia [name] fistula creation.  Continues to dialyze Tuesday Thursday Saturday through a tunneled HD line.  Denies wound healing concerns, drainage, erythema, induration.   Patricia [name] is on a statin for cholesterol management.  Patricia [name] is not on a daily aspirin.   Other AC:   Patricia [name] is  on medications for hypertension.   Patricia [name] is not diabetic. Tobacco hx:  never  Past Medical History:  Diagnosis Date   Acid reflux    Anemia 04/2019   REQUIRING BLOOD TRANSFUSIONS   Anxiety    Arthritis    CHF (congestive heart failure) (HCC)    Dyspnea    with exertion   Dysrhythmia    tachycardia   ESRD (end stage renal disease) ([place])    [facility]   Facial twitching 11/2020   mouth twitch, eyes roll back, neurology started patient on low dose   Sinemet IR.   GI bleeding 12/2017   Gout    Headache(784.0)    "related to chemo; sometimes weekly" (09/12/2013)   Heart murmur    Systolic per Dr Dixie Dials' notes   High cholesterol    History of blood transfusion "a couple"   "related to low counts"   Hypertension    Iron deficiency anemia    "get epogen shots q month" (02/20/2014)   MCGN (minimal change glomerulonephritis)    "using chemo to tx;  finished my last tx in 12/2013"   Neuropathy    Peritoneal dialysis status ([place])    Stroke ([place]) 08/15/2017   ruled out that it was not a stroke    Past Surgical History:  Procedure  Laterality Date   ABDOMINAL HYSTERECTOMY  2010   "laparoscopic"   ANKLE FRACTURE SURGERY Right 1994   AV FISTULA PLACEMENT Left 04/14/2016   Procedure: ARTERIOVENOUS (AV) FISTULA CREATION;  Surgeon: Angelia Mould, MD;  Location: [place];  Service: Vascular;  Laterality: Left;   AV FISTULA PLACEMENT Left 08/09/2017   Procedure: INSERTION OF ARTERIOVENOUS (AV) GORE-TEX GRAFT LEFT ARM;  Surgeon: Elam Dutch, MD;  Location: [place];  Service: Vascular;  Laterality: Left;   AV FISTULA PLACEMENT Right 03/15/2020   Procedure: RIGHT ARM ARTERIOVENOUS (AV) FISTULA CREATION 1st Stage Basilic Transposition;  Surgeon: Serafina Mitchell, MD;  Location: [place];  Service: Vascular;  Laterality: Right;   AV FISTULA PLACEMENT Right 02/24/2021   Procedure: RIGHT ARM ARTERIOVENOUS  FISTULA CREATION;  Surgeon: Broadus John, MD;  Location: [place];  Service: Vascular;  Laterality: Right;  regional block   AVGG REMOVAL Left 08/11/2017   Procedure: REMOVAL OF ARTERIOVENOUS GORETEX GRAFT ([place]);  Surgeon: Serafina Mitchell, MD;  Location: [facility] OR;  Service: Vascular;  Laterality: Left;   Oval Right 05/17/2020   Procedure: RIGHT UPPER EXTREMITY SECOND STAGE [place];  Surgeon: Serafina Mitchell, MD;  Location: [place];  Service: Vascular;  Laterality: Right;   BIOPSY  09/03/2017  Procedure: BIOPSY;  Surgeon: Otis Brace, MD;  Location: [place] ENDOSCOPY;  Service: Gastroenterology;;   BIOPSY  12/24/2017   Procedure: BIOPSY;  Surgeon: Irving Copas., MD;  Location: [place];  Service: Gastroenterology;;   CARDIAC CATHETERIZATION  2000's   COLONOSCOPY WITH PROPOFOL N/A 09/04/2017   Procedure: COLONOSCOPY WITH PROPOFOL;  Surgeon: Ronnette Juniper, MD;  Location: [place];  Service: Gastroenterology;  Laterality: N/A;   ENTEROSCOPY N/A 12/24/2017   Procedure: ENTEROSCOPY;  Surgeon: Rush Landmark Telford Nab., MD;  Location: [place];  Service: Gastroenterology;   Laterality: N/A;   ESOPHAGOGASTRODUODENOSCOPY     ESOPHAGOGASTRODUODENOSCOPY (EGD) WITH PROPOFOL Left 06/04/2017   Procedure: ESOPHAGOGASTRODUODENOSCOPY (EGD) WITH PROPOFOL;  Surgeon: Arta Silence, MD;  Location: [place];  Service: Endoscopy;  Laterality: Left;   ESOPHAGOGASTRODUODENOSCOPY (EGD) WITH PROPOFOL N/A 09/03/2017   Procedure: ESOPHAGOGASTRODUODENOSCOPY (EGD) WITH PROPOFOL;  Surgeon: Otis Brace, MD;  Location: [place];  Service: Gastroenterology;  Laterality: N/A;   FISTULA SUPERFICIALIZATION Left 04/02/2017   Procedure: FISTULA SUPERFICIALIZATION LEFT ARM;  Surgeon: Serafina Mitchell, MD;  Location: [place];  Service: Vascular;  Laterality: Left;   FISTULA SUPERFICIALIZATION Right 04/14/2021   Procedure: RIGHT ARM FISTULA SUPERFICIALIZATION;  Surgeon: Broadus John, MD;  Location: [place];  Service: Vascular;  Laterality: Right;  PERIPHERAL NERVE BLOCK   FISTULOGRAM Left 04/07/2016   Procedure: FISTULOGRAM;  Surgeon: Waynetta Sandy, MD;  Location: [place];  Service: Vascular;  Laterality: Left;   FRACTURE SURGERY     right ankle   GIVENS CAPSULE STUDY N/A 10/29/2017   Procedure: GIVENS CAPSULE STUDY;  Surgeon: Wilford Corner, MD;  Location: [place];  Service: Endoscopy;  Laterality: N/A;   GIVENS CAPSULE STUDY N/A 12/24/2017   Procedure: GIVENS CAPSULE STUDY;  Surgeon: Irving Copas., MD;  Location: [place];  Service: Gastroenterology;  Laterality: N/A;   GIVENS CAPSULE STUDY N/A 05/22/2018   Procedure: GIVENS CAPSULE STUDY;  Surgeon: Irving Copas., MD;  Location: [place];  Service: Gastroenterology;  Laterality: N/A;   INSERTION OF DIALYSIS CATHETER Right 04/07/2016   Procedure: INSERTION OF DIALYSIS CATHETER;  Surgeon: Waynetta Sandy, MD;  Location: [place];  Service: Vascular;  Laterality: Right;   INSERTION OF DIALYSIS CATHETER Right 04/04/2017   Procedure: INSERTION OF DIALYSIS CATHETER;  Surgeon: Waynetta Sandy, MD;  Location: [place];  Service: Vascular;  Laterality: Right;   IR FLUORO GUIDE CV LINE LEFT  03/13/2020   IR US GUIDE VASC ACCESS LEFT  03/13/2020   LEFT HEART CATH AND CORONARY ANGIOGRAPHY N/A 05/12/2019   Procedure: LEFT HEART CATH AND CORONARY ANGIOGRAPHY;  Surgeon: Dixie Dials, MD;  Location: [place] CV LAB;  Service: Cardiovascular;  Laterality: N/A;   LIGATION OF ARTERIOVENOUS  FISTULA Left 04/14/2016   Procedure: LIGATION OF ARTERIOVENOUS  FISTULA;  Surgeon: Angelia Mould, MD;  Location: [place];  Service: Vascular;  Laterality: Left;   LIGATION OF COMPETING BRANCHES OF ARTERIOVENOUS FISTULA Right 05/17/2020   Procedure: LIGATION OF COMPETING BRANCHES OF RIGHT ARM ARTERIOVENOUS FISTULA;  Surgeon: Serafina Mitchell, MD;  Location: [place];  Service: Vascular;  Laterality: Right;   PATCH ANGIOPLASTY Left 06/19/2016   Procedure: PATCH ANGIOPLASTY LEFT ARTERIOVENOUS FISTULA;  Surgeon: Angelia Mould, MD;  Location: [place];  Service: Vascular;  Laterality: Left;   POLYPECTOMY  09/04/2017   Procedure: POLYPECTOMY;  Surgeon: Ronnette Juniper, MD;  Location: [place];  Service: Gastroenterology;;   REVISON OF ARTERIOVENOUS FISTULA Left 06/19/2016   Procedure: REVISION SUPERFICIALIZATION OF  BRACHIOCEPHALIC ARTERIOVENOUS FISTULA;  Surgeon: Angelia Mould, MD;  Location: [place];  Service: Vascular;  Laterality: Left;   UPPER EXTREMITY VENOGRAPHY Bilateral 02/19/2021   Procedure: UPPER EXTREMITY VENOGRAPHY;  Surgeon: Broadus John, MD;  Location: [place] CV LAB;  Service: Cardiovascular;  Laterality: Bilateral;    Social History   Socioeconomic History   Marital status: Single    Spouse name: Not on file   Number of children: 3   Years of education: 14   Highest education level: Associate degree: academic program  Occupational History   Occupation: disabled  Tobacco Use   Smoking status: Never   Smokeless tobacco: Never  Vaping Use   Vaping Use: Never used   Substance and Sexual Activity   Alcohol use: No   Drug use: Never   Sexual activity: Not Currently    Birth control/protection: Surgical  Other Topics Concern   Not on file  Social History Narrative   Not on file   Social Determinants of Health   Financial Resource Strain: Not on file  Food Insecurity: Not on file  Transportation Needs: Not on file  Physical Activity: Not on file  Stress: Not on file  Social Connections: Not on file  Intimate Partner Violence: Not on file    Family History  Problem Relation Age of Onset   Hypertension Mother    Thyroid disease Mother    Coronary artery disease Father    Hypertension Father    Diabetes Father    Colon cancer Maternal Aunt    Esophageal cancer Maternal Uncle    Inflammatory bowel disease Neg Hx    Liver disease Neg Hx    Pancreatic cancer Neg Hx    Rectal cancer Neg Hx    Stomach cancer Neg Hx     Current Outpatient Medications  Medication Sig Dispense Refill   acetaminophen (TYLENOL) 500 MG tablet Take 1,000 mg by mouth every 6 (six) hours as needed for moderate pain.     allopurinol (ZYLOPRIM) 100 MG tablet Take 100 mg by mouth at bedtime.      ALPRAZolam (XANAX) 1 MG tablet Take 1 mg by mouth daily.     carbidopa-levodopa (SINEMET IR) 25-100 MG tablet TAKE 1 TABLET BY MOUTH THREE TIMES DAILY 90 tablet 1   Diclofenac Sodium 1 % CREA 1 gram qid prn 120 g 11   DULoxetine (CYMBALTA) 30 MG capsule Take 1 capsule (30 mg total) by mouth daily. 30 capsule 11   ezetimibe (ZETIA) 10 MG tablet Take 10 mg by mouth daily.     gabapentin (NEURONTIN) 100 MG capsule Take 3 capsules (300 mg total) by mouth 3 (three) times daily. 270 capsule 11   hydrOXYzine (ATARAX/VISTARIL) 25 MG tablet Take 25 mg by mouth every 6 (six) hours as needed for itching.     incobotulinumtoxinA (XEOMIN) 50 units SOLR injection Inject 50 Units into Patricia muscle every 3 (three) months. 1 each 3   lanthanum (FOSRENOL) 1000 MG chewable tablet Chew 1,000-3,000  mg by mouth See admin instructions. Take 3000 mg with each meal and 1000-2000 mg with each snack     lidocaine-prilocaine (EMLA) cream 1gram tid prn 30 g 11   metoprolol succinate (TOPROL-XL) 50 MG 24 hr tablet Take 50 mg by mouth every evening. Take with or immediately following a meal.     multivitamin (RENA-VIT) TABS tablet Take 1 tablet by mouth at bedtime.      omeprazole (PRILOSEC) 40 MG capsule Take 40 mg by  mouth in Patricia morning and at bedtime.     ondansetron (ZOFRAN-ODT) 4 MG disintegrating tablet Take 4 mg by mouth 2 (two) times daily as needed for nausea/vomiting.     vortioxetine HBr (TRINTELLIX) 5 MG TABS tablet Take 5 mg by mouth daily.     zolpidem (AMBIEN) 5 MG tablet Take 5 mg by mouth at bedtime.     No current facility-administered medications for this visit.    Allergies  Allergen Reactions   Nsaids Other (See Comments)    Cannot take due to Kidney disease/Kidney function    Tolmetin Other (See Comments)    Cannot take due to Kidney Disease     REVIEW OF SYSTEMS:   '[X]'$  denotes positive finding, $RemoveBeforeDEI'[ ]'[id number]$  denotes negative finding Cardiac  Comments:  Chest pain or chest pressure:    Shortness of breath upon exertion:    Short of breath when lying flat:    Irregular heart rhythm:        Vascular    Pain in calf, thigh, or hip brought on by ambulation:    Pain in feet at night that wakes you up from your sleep:     Blood clot in your veins:    Leg swelling:         Pulmonary    Oxygen at home:    Productive cough:     Wheezing:         Neurologic    Sudden weakness in arms or legs:     Sudden numbness in arms or legs:     Sudden onset of difficulty speaking or slurred speech:    Temporary loss of vision in one eye:     Problems with dizziness:         Gastrointestinal    Blood in stool:     Vomited blood:         Genitourinary    Burning when urinating:     Blood in urine:        Psychiatric    Major depression:         Hematologic    Bleeding  problems:    Problems with blood clotting too easily:        Skin    Rashes or ulcers:        Constitutional    Fever or chills:      PHYSICAL EXAMINATION:  There were no vitals filed for this visit.   General:  WDWN in NAD; vital signs documented above Gait: Not observed HENT: WNL, normocephalic Pulmonary: normal non-labored breathing  Cardiac: regular HR,  Abdomen: soft, NT, no masses Skin: without rashes Vascular Exam/Pulses:  Right Left  Radial 2+ (normal) 2+ (normal)  Ulnar 2+ (normal) 2+ (normal)                   Extremities: without ischemic changes, without Gangrene , without cellulitis; without open wounds;  Musculoskeletal: no muscle wasting or atrophy  Neurologic: A&O X 3;  No focal weakness or paresthesias are detected Psychiatric:  Patricia [name] has Normal affect.   Non-Invasive Vascular Imaging:    -   ASSESSMENT/PLAN:  Patricia [name] is a 46 y.o. female who presents status post 04/14/21 right-sided brachiocephalic fistula superficialization.     Patient has a palpable thrill in Patricia right arm, with a 2+ radial pulse.  Patricia [name] does note symptoms consistent with mild steal symptoms, but has a history of baseline neuropathy in feet and hands.  Patricia numbness has  not worsened with superficialization.  Physical exam, there is a palpable thrill in Patricia right upper extremity.   Patricia [name] from right upper extremity fistula ultrasound in an effort to ensure adequate flow, size, depth prior to access.  I will call Patricia [name] with these results.    Broadus John, MD [facility] [phone]

## 2021-07-11 ENCOUNTER — Other Ambulatory Visit: Payer: Self-pay | Admitting: Neurology

## 2021-08-21 ENCOUNTER — Other Ambulatory Visit: Payer: Self-pay

## 2021-08-21 ENCOUNTER — Emergency Department: Payer: Medicare Other

## 2021-08-21 ENCOUNTER — Observation Stay
Admission: EM | Admit: 2021-08-21 | Discharge: 2021-08-22 | Disposition: A | Discharge status: Home / Self Care | Payer: Medicare Other | Attending: Emergency Medicine | Admitting: Emergency Medicine

## 2021-08-21 DIAGNOSIS — M109 Gout, unspecified: Secondary | ICD-10-CM | POA: Diagnosis not present

## 2021-08-21 DIAGNOSIS — R0789 Other chest pain: Secondary | ICD-10-CM | POA: Diagnosis present

## 2021-08-21 DIAGNOSIS — Z95820 Peripheral vascular angioplasty status with implants and grafts: Secondary | ICD-10-CM | POA: Diagnosis not present

## 2021-08-21 DIAGNOSIS — Z992 Dependence on renal dialysis: Secondary | ICD-10-CM | POA: Insufficient documentation

## 2021-08-21 DIAGNOSIS — I5030 Unspecified diastolic (congestive) heart failure: Secondary | ICD-10-CM | POA: Diagnosis not present

## 2021-08-21 DIAGNOSIS — R079 Chest pain, unspecified: Secondary | ICD-10-CM | POA: Diagnosis not present

## 2021-08-21 DIAGNOSIS — K219 Gastro-esophageal reflux disease without esophagitis: Secondary | ICD-10-CM | POA: Diagnosis present

## 2021-08-21 DIAGNOSIS — F32A Depression, unspecified: Secondary | ICD-10-CM

## 2021-08-21 DIAGNOSIS — E1129 Type 2 diabetes mellitus with other diabetic kidney complication: Secondary | ICD-10-CM | POA: Diagnosis present

## 2021-08-21 DIAGNOSIS — Z79899 Other long term (current) drug therapy: Secondary | ICD-10-CM | POA: Insufficient documentation

## 2021-08-21 DIAGNOSIS — E1122 Type 2 diabetes mellitus with diabetic chronic kidney disease: Secondary | ICD-10-CM | POA: Insufficient documentation

## 2021-08-21 DIAGNOSIS — F419 Anxiety disorder, unspecified: Secondary | ICD-10-CM | POA: Diagnosis present

## 2021-08-21 DIAGNOSIS — I132 Hypertensive heart and chronic kidney disease with heart failure and with stage 5 chronic kidney disease, or end stage renal disease: Secondary | ICD-10-CM | POA: Insufficient documentation

## 2021-08-21 DIAGNOSIS — N186 End stage renal disease: Secondary | ICD-10-CM | POA: Diagnosis not present

## 2021-08-21 DIAGNOSIS — Z8673 Personal history of transient ischemic attack (TIA), and cerebral infarction without residual deficits: Secondary | ICD-10-CM | POA: Insufficient documentation

## 2021-08-21 LAB — HEPATIC FUNCTION PANEL
ALT: 9 U/L (ref 0–44)
AST: 21 U/L (ref 15–41)
Albumin: 4.1 g/dL (ref 3.5–5.0)
Alkaline Phosphatase: 113 U/L (ref 38–126)
Bilirubin, Direct: <0.1 mg/dL (ref 0.0–0.2)
Total Bilirubin: 0.7 mg/dL (ref 0.3–1.2)
Total Protein: 8.6 g/dL — ABNORMAL HIGH (ref 6.5–8.1)

## 2021-08-21 LAB — BASIC METABOLIC PANEL
Anion gap: 14 (ref 5–15)
BUN: 38 mg/dL — ABNORMAL HIGH (ref 6–20)
CO2: 29 mmol/L (ref 22–32)
Calcium: 8.7 mg/dL — ABNORMAL LOW (ref 8.9–10.3)
Chloride: 96 mmol/L — ABNORMAL LOW (ref 98–111)
Creatinine, Ser: 9.06 mg/dL — ABNORMAL HIGH (ref 0.44–1.00)
GFR, Estimated: 5 mL/min — ABNORMAL LOW (ref >60)
Glucose, Bld: 90 mg/dL (ref 70–99)
Potassium: 4.7 mmol/L (ref 3.5–5.1)
Sodium: 139 mmol/L (ref 135–145)

## 2021-08-21 LAB — CBC
HCT: 37.8 % (ref 36.0–46.0)
Hemoglobin: 11.4 g/dL — ABNORMAL LOW (ref 12.0–15.0)
MCH: 25.1 pg — ABNORMAL LOW (ref 26.0–34.0)
MCHC: 30.2 g/dL (ref 30.0–36.0)
MCV: 83.3 fL (ref 80.0–100.0)
Platelets: 246 10*3/uL (ref 150–400)
RBC: 4.54 MIL/uL (ref 3.87–5.11)
RDW: 18.4 % — ABNORMAL HIGH (ref 11.5–15.5)
WBC: 6 10*3/uL (ref 4.0–10.5)
nRBC: 1.3 % — ABNORMAL HIGH (ref 0.0–0.2)

## 2021-08-21 LAB — TROPONIN I (HIGH SENSITIVITY)
Troponin I (High Sensitivity): 5 ng/L (ref <18)
Troponin I (High Sensitivity): 4 ng/L (ref <18)
Troponin I (High Sensitivity): 5 ng/L (ref <18)
Troponin I (High Sensitivity): 5 ng/L (ref <18)

## 2021-08-21 LAB — LIPASE, BLOOD: Lipase: 49 U/L (ref 11–51)

## 2021-08-21 LAB — HIV ANTIBODY (ROUTINE TESTING W REFLEX): HIV Screen 4th Generation wRfx: NONREACTIVE

## 2021-08-21 MED ORDER — ALPRAZOLAM 0.5 MG PO TABS
1.0000 mg | ORAL_TABLET | Freq: Every day | ORAL | Status: DC
  Administered 2021-08-21 – 2021-08-22 (×2): 1 mg via ORAL
  Filled 2021-08-21 (×2): qty 2

## 2021-08-21 MED ORDER — DULOXETINE HCL 30 MG PO CPEP
30.0000 mg | ORAL_CAPSULE | Freq: Every day | ORAL | Status: DC
  Administered 2021-08-21 – 2021-08-22 (×2): 30 mg via ORAL
  Filled 2021-08-21 (×2): qty 1

## 2021-08-21 MED ORDER — ALLOPURINOL 100 MG PO TABS
100.0000 mg | ORAL_TABLET | Freq: Every day | ORAL | Status: DC
  Administered 2021-08-21: 100 mg via ORAL
  Filled 2021-08-21: qty 1

## 2021-08-21 MED ORDER — ACETAMINOPHEN 650 MG RE SUPP: 650.0000 mg | Freq: Four times a day (QID) | RECTAL | Status: DC | PRN

## 2021-08-21 MED ORDER — PANTOPRAZOLE SODIUM 40 MG PO TBEC
40.0000 mg | DELAYED_RELEASE_TABLET | Freq: Every day | ORAL | Status: DC
  Administered 2021-08-21 – 2021-08-22 (×2): 40 mg via ORAL
  Filled 2021-08-21 (×2): qty 1

## 2021-08-21 MED ORDER — ONDANSETRON HCL 4 MG/2ML IJ SOLN
4.0000 mg | Freq: Four times a day (QID) | INTRAMUSCULAR | Status: DC | PRN
  Administered 2021-08-21: 4 mg via INTRAVENOUS
  Filled 2021-08-21: qty 2

## 2021-08-21 MED ORDER — ACETAMINOPHEN 500 MG PO TABS
1000.0000 mg | ORAL_TABLET | Freq: Once | ORAL | Status: AC
  Administered 2021-08-21: 1000 mg via ORAL
  Filled 2021-08-21: qty 2

## 2021-08-21 MED ORDER — NITROGLYCERIN 0.4 MG SL SUBL
0.4000 mg | SUBLINGUAL_TABLET | SUBLINGUAL | Status: DC | PRN
  Administered 2021-08-21 – 2021-08-22 (×2): 0.4 mg via SUBLINGUAL
  Filled 2021-08-21 (×2): qty 1

## 2021-08-21 MED ORDER — NITROGLYCERIN 0.4 MG SL SUBL: 0.4000 mg | SUBLINGUAL_TABLET | SUBLINGUAL | Status: DC | PRN

## 2021-08-21 MED ORDER — ACETAMINOPHEN 325 MG PO TABS: 650.0000 mg | ORAL_TABLET | Freq: Four times a day (QID) | ORAL | Status: DC | PRN

## 2021-08-21 MED ORDER — LANTHANUM CARBONATE 500 MG PO CHEW
3000.0000 mg | CHEWABLE_TABLET | Freq: Three times a day (TID) | ORAL | Status: DC
  Administered 2021-08-22: 3000 mg via ORAL
  Filled 2021-08-21 (×3): qty 6

## 2021-08-21 MED ORDER — VORTIOXETINE HBR 5 MG PO TABS
5.0000 mg | ORAL_TABLET | Freq: Every day | ORAL | Status: DC
  Administered 2021-08-21 – 2021-08-22 (×2): 5 mg via ORAL
  Filled 2021-08-21 (×2): qty 1

## 2021-08-21 MED ORDER — ONDANSETRON 4 MG PO TBDP: 4.0000 mg | ORAL_TABLET | Freq: Two times a day (BID) | ORAL | Status: DC | PRN

## 2021-08-21 MED ORDER — MAGNESIUM HYDROXIDE 400 MG/5ML PO SUSP: 30.0000 mL | Freq: Every day | ORAL | Status: DC | PRN

## 2021-08-21 MED ORDER — ONDANSETRON HCL 4 MG PO TABS
4.0000 mg | ORAL_TABLET | Freq: Four times a day (QID) | ORAL | Status: DC | PRN
  Administered 2021-08-22: 4 mg via ORAL
  Filled 2021-08-21: qty 1

## 2021-08-21 MED ORDER — LANTHANUM CARBONATE 500 MG PO CHEW
3000.0000 mg | CHEWABLE_TABLET | Freq: Three times a day (TID) | ORAL | Status: DC | PRN
Start: 2021-08-21 — End: 2021-08-22
  Administered 2021-08-22: 3000 mg via ORAL
  Filled 2021-08-21: qty 6

## 2021-08-21 MED ORDER — MORPHINE SULFATE (PF) 2 MG/ML IV SOLN
2.0000 mg | INTRAVENOUS | Status: DC | PRN
  Filled 2021-08-21: qty 1

## 2021-08-21 MED ORDER — ENOXAPARIN SODIUM 60 MG/0.6ML IJ SOSY
55.0000 mg | PREFILLED_SYRINGE | INTRAMUSCULAR | Status: DC
  Administered 2021-08-21: 55 mg via SUBCUTANEOUS
  Filled 2021-08-21: qty 0.6

## 2021-08-21 MED ORDER — TRAZODONE HCL 50 MG PO TABS
25.0000 mg | ORAL_TABLET | Freq: Every evening | ORAL | Status: DC | PRN
  Administered 2021-08-22: 25 mg via ORAL
  Filled 2021-08-21: qty 1

## 2021-08-21 MED ORDER — RENA-VITE PO TABS
1.0000 | ORAL_TABLET | Freq: Every day | ORAL | Status: DC
  Administered 2021-08-21: 1 via ORAL
  Filled 2021-08-21: qty 1

## 2021-08-21 MED ORDER — OXYCODONE HCL 5 MG PO TABS
5.0000 mg | ORAL_TABLET | Freq: Once | ORAL | Status: AC
  Administered 2021-08-21: 5 mg via ORAL
  Filled 2021-08-21: qty 1

## 2021-08-21 MED ORDER — EZETIMIBE 10 MG PO TABS
10.0000 mg | ORAL_TABLET | Freq: Every day | ORAL | Status: DC
  Administered 2021-08-22: 10 mg via ORAL
  Filled 2021-08-21: qty 1

## 2021-08-21 MED ORDER — ASPIRIN 81 MG PO CHEW
324.0000 mg | CHEWABLE_TABLET | Freq: Once | ORAL | Status: AC
Start: 2021-08-21 — End: 2021-08-21
  Administered 2021-08-21: 324 mg via ORAL
  Filled 2021-08-21: qty 4

## 2021-08-21 MED ORDER — SODIUM CHLORIDE 0.9 % IV SOLN: INTRAVENOUS | Status: DC

## 2021-08-21 MED ORDER — MORPHINE SULFATE (PF) 2 MG/ML IV SOLN
INTRAVENOUS | Status: AC
  Administered 2021-08-21: 2 mg via INTRAVENOUS
  Filled 2021-08-21: qty 1

## 2021-08-21 MED ORDER — GABAPENTIN 300 MG PO CAPS
300.0000 mg | ORAL_CAPSULE | Freq: Three times a day (TID) | ORAL | Status: DC
  Administered 2021-08-21 – 2021-08-22 (×2): 300 mg via ORAL
  Filled 2021-08-21 (×2): qty 1

## 2021-08-21 MED ORDER — CARBIDOPA-LEVODOPA 25-100 MG PO TABS
1.0000 | ORAL_TABLET | Freq: Three times a day (TID) | ORAL | Status: DC
  Administered 2021-08-21 – 2021-08-22 (×2): 1 via ORAL
  Filled 2021-08-21 (×3): qty 1

## 2021-08-21 MED ORDER — LANTHANUM CARBONATE 500 MG PO CHEW
1000.0000 mg | CHEWABLE_TABLET | ORAL | Status: DC
Start: 2021-08-21 — End: 2021-08-22
  Filled 2021-08-21 (×4): qty 2

## 2021-08-21 MED ORDER — HYDROXYZINE HCL 25 MG PO TABS
25.0000 mg | ORAL_TABLET | Freq: Four times a day (QID) | ORAL | Status: DC | PRN
  Administered 2021-08-21 – 2021-08-22 (×2): 25 mg via ORAL
  Filled 2021-08-21 (×2): qty 1

## 2021-08-21 MED ORDER — LANTHANUM CARBONATE 500 MG PO CHEW: 1000.0000 mg | CHEWABLE_TABLET | Freq: Every day | ORAL | Status: DC | PRN

## 2021-08-21 MED ORDER — METOPROLOL SUCCINATE ER 50 MG PO TB24
50.0000 mg | ORAL_TABLET | Freq: Every evening | ORAL | Status: DC
  Administered 2021-08-21: 50 mg via ORAL
  Filled 2021-08-21: qty 1

## 2021-08-21 NOTE — Assessment & Plan Note (Signed)
-   The patient will be admitted placed on treatment coverage with NovoLog.

## 2021-08-21 NOTE — Assessment & Plan Note (Addendum)
Serial troponins unremarkable.  Case reviewed by cardiology.  Patient had a left heart catheterization in 2021 which noted normal coronaries and minimal for ostial left main calcifications.  Subsequent PET myocardial perfusion study in September 2022 noted no scar or ischemia and preserved ejection fraction.  EKG is nonacute.  In review of findings and previous history, felt to be cleared from a cardiac standpoint.  Chest heaviness not felt to be secondary to cardiac ischemia.  Chest x-ray unremarkable.  No signs of hypoxia or tachycardia so by Wells criteria, PE ruled out.  In discussion with nephrology, large-volume dialysis oftentimes can lead to fluid shifts and chest heaviness which is the likely cause of her symptoms.  Patient reassured.  Did have some relief with Ultram so given a prescription for several more pills.

## 2021-08-21 NOTE — Progress Notes (Signed)
Arterial dialysis needle removed without difficulty from right upper arm.. Pressure held for approximately 3 minutes. Positive thrill noted. Gauze and paper tape intact. No bleeding noted.

## 2021-08-21 NOTE — ED Notes (Signed)
Pt states pain started during dialysis; was 3 hours in to it; states had 1 hour left; heaviness; 7/10; center/L-sided per pt. Reports nausea at time of first pain; denies dizziness, nausea, sweatiness, and SOB currently. Family with pt. Pt's resp reg/unlabored, skin dry and resting calmly on stretcher.

## 2021-08-21 NOTE — Assessment & Plan Note (Signed)
-   We will continue PPI therapy 

## 2021-08-21 NOTE — ED Triage Notes (Addendum)
Pt. To ED via EMS from dialysis for sudden onset SOB, with chest heaviness. Pt. Had 3 of 4 hours of dialysis. Per medic, O2 sats were 100% en route. Pt states SOB has resolved somewhat. Pt is alert and oriented, speaking full sentences in triage.

## 2021-08-21 NOTE — Assessment & Plan Note (Signed)
-   We will continue Xanax, Cymbalta and Trintellix

## 2021-08-21 NOTE — ED Notes (Signed)
Pt has pain in center of chest.  Pt did not complete dialysis today .  Pt alert  nsr on monitor.

## 2021-08-21 NOTE — H&P (Signed)
[place]   [name]    MR#:  [id number]  DATE OF BIRTH:  1975-10-13  DATE OF ADMISSION:  08/21/2021  PRIMARY CARE PHYSICIAN: Doyle Askew, PA-C   Patient is coming from: Home  REQUESTING/REFERRING PHYSICIAN: Brenton Grills, MD  CHIEF COMPLAINT:   Chief Complaint  Patient presents with   Chest Pain    Pt. To ED via EMS from dialysis for sudden onset SOB, with chest heaviness. Pt. Had 3 of 4 hours of dialysis. Per medic, O2 sats were 100% en route. Pt states SOB has resolved somewhat.    HISTORY OF PRESENT ILLNESS:  Patricia [name] is a 46 y.o. African-American female with medical history significant for GERD, CHF, ESRD on hemodialysis on TTS, gout, dyslipidemia and hypertension, who presented to the ER with acute onset of midsternal chest pain felt as heaviness and pressure and graded 7/10 in severity with associated dyspnea while she was getting her hemodialysis.  She had radiation to her left shoulder with associated left arm pain.  She apparently got 3 out of 4 hours of her hemodialysis session.  Her dyspnea has somehow resolved.  No leg pain or edema or recent travels or surgeries.  No cough or wheezing or hemoptysis.  No fever or chills.  No bleeding diathesis.  No fever or chills.  No nausea or vomiting or abdominal pain.  No cough or wheezing or hemoptysis.  No leg pain or edema or recent travels or surgeries.  ED Course: When she came to the ER, vital signs were within normal.  Labs revealed a BUN of 38 and creatinine 9.06 with a calcium of 8.7.  High-sensitivity troponin was 5 and later 4.   EKG as reviewed by me :  EKG showed normal sinus rhythm with a rate of 63 with T wave inversion laterally and in V1. Imaging:  Two-view chest x-ray showed no acute cardiopulmonary disease.  The patient was given 1 g of p.o. Tylenol and aspirin.  She will be admitted to an observation cardiac telemetry bed for further evaluation and  management. PAST MEDICAL HISTORY:   Past Medical History:  Diagnosis Date   Acid reflux    Anemia 04/2019   REQUIRING BLOOD TRANSFUSIONS   Anxiety    Arthritis    CHF (congestive heart failure) (HCC)    Dyspnea    with exertion   Dysrhythmia    tachycardia   ESRD (end stage renal disease) ([place])    [facility] [place]   Facial twitching 11/2020   mouth twitch, eyes roll back, neurology started patient on low dose   Sinemet IR.   GI bleeding 12/2017   Gout    Headache(784.0)    "related to chemo; sometimes weekly" (09/12/2013)   Heart murmur    Systolic per Dr Dixie Dials' notes   High cholesterol    History of blood transfusion "a couple"   "related to low counts"   Hypertension    Iron deficiency anemia    "get epogen shots q month" (02/20/2014)   MCGN (minimal change glomerulonephritis)    "using chemo to tx;  finished my last tx in 12/2013"   Neuropathy    Peritoneal dialysis status ([place])    Stroke ([place]) 08/15/2017   ruled out that it was not a stroke    PAST SURGICAL HISTORY:   Past Surgical History:  Procedure Laterality Date   ABDOMINAL HYSTERECTOMY  2010   "laparoscopic"   ANKLE FRACTURE SURGERY Right  1994   AV FISTULA PLACEMENT Left 04/14/2016   Procedure: ARTERIOVENOUS (AV) FISTULA CREATION;  Surgeon: Chuck Hint, MD;  Location: [facility] OR;  Service: Vascular;  Laterality: Left;   AV FISTULA PLACEMENT Left 08/09/2017   Procedure: INSERTION OF ARTERIOVENOUS (AV) GORE-TEX GRAFT LEFT ARM;  Surgeon: Sherren Kerns, MD;  Location: [facility] OR;  Service: Vascular;  Laterality: Left;   AV FISTULA PLACEMENT Right 03/15/2020   Procedure: RIGHT ARM ARTERIOVENOUS (AV) FISTULA CREATION 1st Stage Basilic Transposition;  Surgeon: Nada Libman, MD;  Location: [facility] OR;  Service: Vascular;  Laterality: Right;   AV FISTULA PLACEMENT Right 02/24/2021   Procedure: RIGHT ARM ARTERIOVENOUS  FISTULA CREATION;  Surgeon: Victorino Sparrow, MD;  Location: [facility] OR;  Service: Vascular;   Laterality: Right;  regional block   AVGG REMOVAL Left 08/11/2017   Procedure: REMOVAL OF ARTERIOVENOUS GORETEX GRAFT (AVGG);  Surgeon: Nada Libman, MD;  Location: [facility] OR;  Service: Vascular;  Laterality: Left;   BASCILIC VEIN TRANSPOSITION Right 05/17/2020   Procedure: RIGHT UPPER EXTREMITY SECOND STAGE BASCILIC VEIN TRANSPOSITION;  Surgeon: Nada Libman, MD;  Location: [facility] OR;  Service: Vascular;  Laterality: Right;   BIOPSY  09/03/2017   Procedure: BIOPSY;  Surgeon: Kathi Der, MD;  Location: [facility] ENDOSCOPY;  Service: Gastroenterology;;   BIOPSY  12/24/2017   Procedure: BIOPSY;  Surgeon: Lemar Lofty., MD;  Location: [facility] ENDOSCOPY;  Service: Gastroenterology;;   CARDIAC CATHETERIZATION  2000's   COLONOSCOPY WITH PROPOFOL N/A 09/04/2017   Procedure: COLONOSCOPY WITH PROPOFOL;  Surgeon: Kerin Salen, MD;  Location: [facility] ENDOSCOPY;  Service: Gastroenterology;  Laterality: N/A;   ENTEROSCOPY N/A 12/24/2017   Procedure: ENTEROSCOPY;  Surgeon: Meridee Score Netty Starring., MD;  Location: [facility] ENDOSCOPY;  Service: Gastroenterology;  Laterality: N/A;   ESOPHAGOGASTRODUODENOSCOPY     ESOPHAGOGASTRODUODENOSCOPY (EGD) WITH PROPOFOL Left 06/04/2017   Procedure: ESOPHAGOGASTRODUODENOSCOPY (EGD) WITH PROPOFOL;  Surgeon: Willis Modena, MD;  Location: [facility] ENDOSCOPY;  Service: Endoscopy;  Laterality: Left;   ESOPHAGOGASTRODUODENOSCOPY (EGD) WITH PROPOFOL N/A 09/03/2017   Procedure: ESOPHAGOGASTRODUODENOSCOPY (EGD) WITH PROPOFOL;  Surgeon: Kathi Der, MD;  Location: [facility] ENDOSCOPY;  Service: Gastroenterology;  Laterality: N/A;   FISTULA SUPERFICIALIZATION Left 04/02/2017   Procedure: FISTULA SUPERFICIALIZATION LEFT ARM;  Surgeon: Nada Libman, MD;  Location: [facility] OR;  Service: Vascular;  Laterality: Left;   FISTULA SUPERFICIALIZATION Right 04/14/2021   Procedure: RIGHT ARM FISTULA SUPERFICIALIZATION;  Surgeon: Victorino Sparrow, MD;  Location: [facility] OR;  Service: Vascular;  Laterality: Right;   PERIPHERAL NERVE BLOCK   FISTULOGRAM Left 04/07/2016   Procedure: FISTULOGRAM;  Surgeon: Maeola Harman, MD;  Location: [facility] OR;  Service: Vascular;  Laterality: Left;   FRACTURE SURGERY     right ankle   GIVENS CAPSULE STUDY N/A 10/29/2017   Procedure: GIVENS CAPSULE STUDY;  Surgeon: Charlott Rakes, MD;  Location: [facility] ENDOSCOPY;  Service: Endoscopy;  Laterality: N/A;   GIVENS CAPSULE STUDY N/A 12/24/2017   Procedure: GIVENS CAPSULE STUDY;  Surgeon: Lemar Lofty., MD;  Location: [facility] ENDOSCOPY;  Service: Gastroenterology;  Laterality: N/A;   GIVENS CAPSULE STUDY N/A 05/22/2018   Procedure: GIVENS CAPSULE STUDY;  Surgeon: Lemar Lofty., MD;  Location: [facility] ENDOSCOPY;  Service: Gastroenterology;  Laterality: N/A;   INSERTION OF DIALYSIS CATHETER Right 04/07/2016   Procedure: INSERTION OF DIALYSIS CATHETER;  Surgeon: Maeola Harman, MD;  Location: [facility] OR;  Service: Vascular;  Laterality: Right;   INSERTION OF DIALYSIS CATHETER Right 04/04/2017   Procedure: INSERTION OF DIALYSIS CATHETER;  Surgeon: Waynetta Sandy, MD;  Location: [place];  Service: Vascular;  Laterality: Right;   IR FLUORO GUIDE CV LINE LEFT  03/13/2020   IR US GUIDE VASC ACCESS LEFT  03/13/2020   LEFT HEART CATH AND CORONARY ANGIOGRAPHY N/A 05/12/2019   Procedure: LEFT HEART CATH AND CORONARY ANGIOGRAPHY;  Surgeon: Dixie Dials, MD;  Location: [place] CV LAB;  Service: Cardiovascular;  Laterality: N/A;   LIGATION OF ARTERIOVENOUS  FISTULA Left 04/14/2016   Procedure: LIGATION OF ARTERIOVENOUS  FISTULA;  Surgeon: Angelia Mould, MD;  Location: [place];  Service: Vascular;  Laterality: Left;   LIGATION OF COMPETING BRANCHES OF ARTERIOVENOUS FISTULA Right 05/17/2020   Procedure: LIGATION OF COMPETING BRANCHES OF RIGHT ARM ARTERIOVENOUS FISTULA;  Surgeon: Serafina Mitchell, MD;  Location: [place];  Service: Vascular;  Laterality: Right;   PATCH ANGIOPLASTY Left 06/19/2016   Procedure: PATCH  ANGIOPLASTY LEFT ARTERIOVENOUS FISTULA;  Surgeon: Angelia Mould, MD;  Location: [place];  Service: Vascular;  Laterality: Left;   POLYPECTOMY  09/04/2017   Procedure: POLYPECTOMY;  Surgeon: Ronnette Juniper, MD;  Location: [facility] ENDOSCOPY;  Service: Gastroenterology;;   REVISON OF ARTERIOVENOUS FISTULA Left 06/19/2016   Procedure: REVISION SUPERFICIALIZATION OF BRACHIOCEPHALIC ARTERIOVENOUS FISTULA;  Surgeon: Angelia Mould, MD;  Location: [place];  Service: Vascular;  Laterality: Left;   UPPER EXTREMITY VENOGRAPHY Bilateral 02/19/2021   Procedure: UPPER EXTREMITY VENOGRAPHY;  Surgeon: Broadus John, MD;  Location: [place] CV LAB;  Service: Cardiovascular;  Laterality: Bilateral;    SOCIAL HISTORY:   Social History   Tobacco Use   Smoking status: Never   Smokeless tobacco: Never  Substance Use Topics   Alcohol use: No    FAMILY HISTORY:   Family History  Problem Relation Age of Onset   Hypertension Mother    Thyroid disease Mother    Coronary artery disease Father    Hypertension Father    Diabetes Father    Colon cancer Maternal Aunt    Esophageal cancer Maternal Uncle    Inflammatory bowel disease Neg Hx    Liver disease Neg Hx    Pancreatic cancer Neg Hx    Rectal cancer Neg Hx    Stomach cancer Neg Hx     DRUG ALLERGIES:   Allergies  Allergen Reactions   Nsaids Other (See Comments)    Cannot take due to Kidney disease/Kidney function    Tolmetin Other (See Comments)    Cannot take due to Kidney Disease    REVIEW OF SYSTEMS:   ROS As per history of present illness. All pertinent systems were reviewed above. Constitutional, HEENT, cardiovascular, respiratory, GI, GU, musculoskeletal, neuro, psychiatric, endocrine, integumentary and hematologic systems were reviewed and are otherwise negative/unremarkable except for positive findings mentioned above in the HPI.   MEDICATIONS AT HOME:   Prior to Admission medications   Medication Sig Start Date End  Date Taking? Authorizing Provider  allopurinol (ZYLOPRIM) 100 MG tablet Take 100 mg by mouth at bedtime.    Yes [provider]  ALPRAZolam Duanne Moron) 1 MG tablet Take 1 mg by mouth 2 (two) times daily as needed for anxiety or sleep. 03/19/17  Yes [provider]  carbidopa-levodopa (SINEMET IR) 25-100 MG tablet TAKE 1 TABLET BY MOUTH THREE TIMES DAILY 05/12/21  Yes Marcial Pacas, MD  DULoxetine (CYMBALTA) 30 MG capsule Take 1 capsule (30 mg total) by mouth daily. 05/26/21  Yes Marcial Pacas, MD  ezetimibe (ZETIA) 10 MG tablet Take 10 mg by mouth  daily. 02/21/21  Yes [provider]  gabapentin (NEURONTIN) 100 MG capsule Take 3 capsules (300 mg total) by mouth 3 (three) times daily. 05/26/21  Yes Levert Feinstein, MD  hydrOXYzine (ATARAX/VISTARIL) 25 MG tablet Take 25 mg by mouth every 6 (six) hours as needed for itching. 08/16/19  Yes [provider]  lanthanum (FOSRENOL) 1000 MG chewable tablet Chew 1,000-3,000 mg by mouth See admin instructions. Take 3000 mg with each meal and 1000-2000 mg with each snack 02/05/21  Yes [provider]  metoprolol succinate (TOPROL-XL) 50 MG 24 hr tablet Take 50 mg by mouth every evening. Take with or immediately following a meal.   Yes [provider]  multivitamin (RENA-VIT) TABS tablet Take 1 tablet by mouth at bedtime.    Yes [provider]  omeprazole (PRILOSEC) 40 MG capsule Take 40 mg by mouth in the morning and at bedtime.   Yes [provider]  vortioxetine HBr (TRINTELLIX) 5 MG TABS tablet Take 5 mg by mouth daily.   Yes [provider]  zolpidem (AMBIEN) 5 MG tablet Take 5 mg by mouth at bedtime. 04/07/21  Yes [provider]  acetaminophen (TYLENOL) 500 MG tablet Take 1,000 mg by mouth every 6 (six) hours as needed for moderate pain.    [provider]  Diclofenac Sodium 1 % CREA 1 gram qid prn 05/26/21   Levert Feinstein, MD  incobotulinumtoxinA [facility]) 50 units SOLR injection Inject  50 Units into the muscle every 3 (three) months. 05/27/21   Levert Feinstein, MD  lidocaine-prilocaine (EMLA) cream 1gram tid prn 05/26/21   Levert Feinstein, MD  ondansetron (ZOFRAN-ODT) 4 MG disintegrating tablet Take 4 mg by mouth 2 (two) times daily as needed for nausea/vomiting. 03/20/21   [provider]      VITAL SIGNS:  Blood pressure 124/65, pulse 65, temperature 97.7 F (36.5 C), temperature source Oral, resp. rate 12, height 5\' 7"  (1.702 m), weight 111 kg, SpO2 97 %.  PHYSICAL EXAMINATION:  Physical Exam  GENERAL:  46 y.o.-year-old African-American female patient lying in the bed with no acute distress.  EYES: Pupils equal, round, reactive to light and accommodation. No scleral icterus. Extraocular muscles intact.  HEENT: Head atraumatic, normocephalic. Oropharynx and nasopharynx clear.  NECK:  Supple, no jugular venous distention. No thyroid enlargement, no tenderness.  LUNGS: Normal breath sounds bilaterally, no wheezing, rales,rhonchi or crepitation. No use of accessory muscles of respiration.  CARDIOVASCULAR: Regular rate and rhythm, S1, S2 normal. No murmurs, rubs, or gallops.  ABDOMEN: Soft, nondistended, nontender. Bowel sounds present. No organomegaly or mass.  EXTREMITIES: No pedal edema, cyanosis, or clubbing.  NEUROLOGIC: Cranial nerves II through XII are intact. Muscle strength 5/5 in all extremities. Sensation intact. Gait not checked.  PSYCHIATRIC: The patient is alert and oriented x 3.  Normal affect and good eye contact. SKIN: No obvious rash, lesion, or ulcer.   LABORATORY PANEL:   CBC Recent Labs  Lab 08/21/21 1003  WBC 6.0  HGB 11.4*  HCT 37.8  PLT 246   ------------------------------------------------------------------------------------------------------------------  Chemistries  Recent Labs  Lab 08/21/21 1003  NA 139  K 4.7  CL 96*  CO2 29  GLUCOSE 90  BUN 38*  CREATININE 9.06*  CALCIUM 8.7*  AST 21  ALT 9  ALKPHOS 113  BILITOT 0.7    ------------------------------------------------------------------------------------------------------------------  Cardiac Enzymes No results for input(s): "TROPONINI" in the last 168 hours. ------------------------------------------------------------------------------------------------------------------  RADIOLOGY:  DG Chest 2 View  Result Date: 08/21/2021 CLINICAL DATA:  Chest pain EXAM: CHEST - 2 VIEW COMPARISON:  04/26/2021 FINDINGS: The heart size and mediastinal contours are within normal limits. Both lungs are clear. The visualized skeletal structures are unremarkable. IMPRESSION: Negative. Electronically Signed   By: Rolm Baptise M.D.   On: 08/21/2021 10:52      IMPRESSION AND PLAN:  Assessment and Plan: Chest pain - The patient will be admitted to a cardiac telemetry  observation bed. - We will follow her troponins. - We will place her on as needed sublingual nitroglycerin and IV morphine sulfate for pain in addition to aspirin. - We will continue beta-blocker therapy with Toprol-XL. - Cardiology consult will be obtained. - I notified Dr. Saralyn Pilar about the patient.   ESRD on dialysis [facility]) - We will obtain a follow-up nephrology consultation. - I notified Dr. Holley Raring about the patient.  Anxiety and depression - We will continue Xanax, Cymbalta and Trintellix  GERD without esophagitis - We will continue PPI therapy.  Gout - We will continue allopurinol.  Type 2 diabetes mellitus with other diabetic kidney complication (HCC) - The patient will be admitted placed on treatment coverage with NovoLog.    DVT prophylaxis: Lovenox.  Advanced Care Planning:  Code Status: full code.  Family Communication:  The plan of care was discussed in details with the patient (and family). I answered all questions. The patient agreed to proceed with the above mentioned plan. Further management will depend upon hospital course. Disposition Plan: Back to previous home  environment Consults called: Cardiology and nephrology All the records are reviewed and case discussed with ED provider.  Status is: Observation   I certify that at the time of admission, it is my clinical judgment that the patient will require hospital care extending less than 2 midnights.                            Dispo: The patient is from: Home              Anticipated d/c is to: Home              Patient currently is not medically stable to d/c.              Difficult to place patient: No  Christel Mormon M.D on 08/21/2021 at 5:26 PM  [facility]   From 7 PM-7 AM, contact night-coverage [email or web address]  CC: Primary care physician; Doyle Askew, PA-C

## 2021-08-21 NOTE — ED Provider Notes (Signed)
[facility] Provider Note    Event Date/Time   First MD Initiated Contact with Patient 08/21/21 1505     (approximate)   History   Chief Complaint: Chest Pain (Pt. To ED via EMS from dialysis for sudden onset SOB, with chest heaviness. Pt. Had 3 of 4 hours of dialysis. Per medic, O2 sats were 100% en route. Pt states SOB has resolved somewhat.)   HPI  Patricia [name] is a 46 y.o. female with a history of ESRD on hemodialysis, minimal-change glomerulonephritis, CHF, hypertension hyperlipidemia who comes to the ED complaining of shortness of breath and chest heaviness that started at about 9:00 AM today while in the midst of her routine dialysis session.  Prior to dialysis, patient was in usual state of health and asymptomatic.  She does note that over the last several days she has been getting episodes of exertional chest pain.  Heaviness persists, nonradiating, no aggravating or alleviating factors.  No vomiting or diaphoresis, no fever or cough.     Physical Exam   Triage Vital Signs: ED Triage Vitals  Enc Vitals Group     BP 08/21/21 0946 134/83     Pulse Rate 08/21/21 0945 61     Resp 08/21/21 0945 18     Temp 08/21/21 0945 97.7 F (36.5 C)     Temp Source 08/21/21 0945 Oral     SpO2 08/21/21 0945 100 %     Weight 08/21/21 0941 244 lb 11.4 oz (111 kg)     Height 08/21/21 0941 $RemoveBefor'5\' 7"'SYreIwyIvDtn$  (1.702 m)     Head Circumference --      Peak Flow --      Pain Score 08/21/21 0941 7     Pain Loc --      Pain Edu? --      Excl. in [place]? --     Most recent vital signs: Vitals:   08/21/21 1530 08/21/21 1542  BP: (!) 137/91   Pulse: (!) 58   Resp: 19   Temp:  97.7 F (36.5 C)  SpO2: 98%     General: Awake, no distress.  CV:  Good peripheral perfusion.  Regular rate and rhythm Resp:  Normal effort.  Clear to auscultation bilaterally. Abd:  No distention.  Soft and nontender Other:  No lower extremity edema.  Moist oral mucosa.   ED Results /  Procedures / Treatments   Labs (all labs ordered are listed, but only abnormal results are displayed) Labs Reviewed  BASIC METABOLIC PANEL - Abnormal; Notable for the following components:      Result Value   Chloride 96 (*)    BUN 38 (*)    Creatinine, Ser 9.06 (*)    Calcium 8.7 (*)    GFR, Estimated 5 (*)    All other components within normal limits  CBC - Abnormal; Notable for the following components:   Hemoglobin 11.4 (*)    MCH 25.1 (*)    RDW 18.4 (*)    nRBC 1.3 (*)    All other components within normal limits  POC URINE PREG, ED  TROPONIN I (HIGH SENSITIVITY)  TROPONIN I (HIGH SENSITIVITY)     EKG Interpreted by me Normal sinus rhythm, rate of 63.  Normal axis, normal intervals.  Normal QRS ST segments and T waves.   RADIOLOGY Chest x-ray interpreted by me, appears normal.  Radiology report reviewed.   PROCEDURES:  Procedures   MEDICATIONS ORDERED IN ED: Medications  nitroGLYCERIN (NITROSTAT)  SL tablet 0.4 mg (has no administration in time range)  acetaminophen (TYLENOL) tablet 1,000 mg (has no administration in time range)     IMPRESSION / MDM / ASSESSMENT AND PLAN / ED COURSE  I reviewed the triage vital signs and the nursing notes.                              Differential diagnosis includes, but is not limited to, non-STEMI, pneumothorax, pneumonia, GERD, pancreatitis  Patient's presentation is most consistent with acute presentation with potential threat to life or bodily function.  Patient presents with central chest heaviness associated with shortness of breath and recent exertional symptoms.  EKG, vital signs, labs are all unremarkable thus far.  Low suspicion for PE dissection or pericardial effusion.  We will try nitroglycerin to see if this improves her pain.  We will need to hospitalize for further cardiac work-up and telemetry monitoring.       FINAL CLINICAL IMPRESSION(S) / ED DIAGNOSES   Final diagnoses:  Chest pain with  moderate risk for cardiac etiology  ESRD on hemodialysis [facility])     Rx / DC Orders   ED Discharge Orders     None        Note:  This document was prepared using Dragon voice recognition software and may include unintentional dictation errors.   Carrie Mew, MD 08/21/21 [id number]

## 2021-08-21 NOTE — Assessment & Plan Note (Deleted)
-   We will continue Cymbalta and Trintellix

## 2021-08-21 NOTE — Assessment & Plan Note (Addendum)
Followed by nephrology.  As above.  Next dialysis session scheduled for Saturday 8/19

## 2021-08-21 NOTE — Assessment & Plan Note (Signed)
-   We will continue allopurinol 

## 2021-08-22 DIAGNOSIS — R079 Chest pain, unspecified: Secondary | ICD-10-CM | POA: Diagnosis not present

## 2021-08-22 DIAGNOSIS — K219 Gastro-esophageal reflux disease without esophagitis: Secondary | ICD-10-CM | POA: Diagnosis not present

## 2021-08-22 DIAGNOSIS — N186 End stage renal disease: Secondary | ICD-10-CM | POA: Diagnosis not present

## 2021-08-22 LAB — CBC
HCT: 32.4 % — ABNORMAL LOW (ref 36.0–46.0)
Hemoglobin: 9.8 g/dL — ABNORMAL LOW (ref 12.0–15.0)
MCH: 25.5 pg — ABNORMAL LOW (ref 26.0–34.0)
MCHC: 30.2 g/dL (ref 30.0–36.0)
MCV: 84.2 fL (ref 80.0–100.0)
Platelets: 241 10*3/uL (ref 150–400)
RBC: 3.85 MIL/uL — ABNORMAL LOW (ref 3.87–5.11)
RDW: 17.8 % — ABNORMAL HIGH (ref 11.5–15.5)
WBC: 6.3 10*3/uL (ref 4.0–10.5)
nRBC: 0.6 % — ABNORMAL HIGH (ref 0.0–0.2)

## 2021-08-22 LAB — BASIC METABOLIC PANEL
Anion gap: 12 (ref 5–15)
BUN: 57 mg/dL — ABNORMAL HIGH (ref 6–20)
CO2: 26 mmol/L (ref 22–32)
Calcium: 8.1 mg/dL — ABNORMAL LOW (ref 8.9–10.3)
Chloride: 99 mmol/L (ref 98–111)
Creatinine, Ser: 11.02 mg/dL — ABNORMAL HIGH (ref 0.44–1.00)
GFR, Estimated: 4 mL/min — ABNORMAL LOW (ref >60)
Glucose, Bld: 110 mg/dL — ABNORMAL HIGH (ref 70–99)
Potassium: 5.4 mmol/L — ABNORMAL HIGH (ref 3.5–5.1)
Sodium: 137 mmol/L (ref 135–145)

## 2021-08-22 LAB — HEPATITIS B SURFACE ANTIGEN: Hepatitis B Surface Ag: NONREACTIVE

## 2021-08-22 MED ORDER — CHLORHEXIDINE GLUCONATE CLOTH 2 % EX PADS: 6.0000 | MEDICATED_PAD | Freq: Every day | CUTANEOUS | Status: DC

## 2021-08-22 MED ORDER — LACTULOSE 10 GM/15ML PO SOLN
10.0000 g | ORAL | Status: AC
  Administered 2021-08-22: 10 g via ORAL
  Filled 2021-08-22: qty 30

## 2021-08-22 MED ORDER — DOCUSATE SODIUM 100 MG PO CAPS
200.0000 mg | ORAL_CAPSULE | Freq: Two times a day (BID) | ORAL | Status: DC
  Administered 2021-08-22: 200 mg via ORAL
  Filled 2021-08-22: qty 2

## 2021-08-22 MED ORDER — TRAMADOL HCL 50 MG PO TABS
50.0000 mg | ORAL_TABLET | Freq: Four times a day (QID) | ORAL | Status: DC | PRN
  Administered 2021-08-22: 50 mg via ORAL
  Filled 2021-08-22: qty 1

## 2021-08-22 MED ORDER — ALBUTEROL SULFATE (2.5 MG/3ML) 0.083% IN NEBU
2.5000 mg | INHALATION_SOLUTION | RESPIRATORY_TRACT | Status: AC
Start: 2021-08-22 — End: 2021-08-22
  Administered 2021-08-22: 2.5 mg via RESPIRATORY_TRACT
  Filled 2021-08-22: qty 3

## 2021-08-22 MED ORDER — TRAMADOL HCL 50 MG PO TABS: 50.0000 mg | ORAL_TABLET | Freq: Four times a day (QID) | ORAL | 0 refills | Status: DC | PRN

## 2021-08-22 MED ORDER — SODIUM ZIRCONIUM CYCLOSILICATE 10 G PO PACK
10.0000 g | PACK | Freq: Once | ORAL | Status: AC
  Administered 2021-08-22: 10 g via ORAL
  Filled 2021-08-22: qty 1

## 2021-08-22 MED ORDER — HEPARIN SODIUM (PORCINE) 5000 UNIT/ML IJ SOLN: 5000.0000 [IU] | Freq: Three times a day (TID) | INTRAMUSCULAR | Status: DC

## 2021-08-22 NOTE — Consult Note (Signed)
[place] NOTE       Patient ID: Patricia [name] MRN: [id number] DOB/AGE: [birth year] 46 y.o.  Admit date: 08/21/2021 Referring Physician Dr. Eugenie Norrie  Primary Physician [name], PA-C  Primary Cardiologist none Reason for Consultation chest pain   HPI: Patricia [name] is a 46yoF with a PMH of ESRD on TTS dialysis, abnormal NM stress test and subsequent LHC 05/2019 with normal coronaries, minimal calcification near ostium left main, paroxysmal SVT, dyslipidemia, morbid obesity who presented to [facility] ED 08/21/2021 with chest pain and heaviness associated with shortness of breath during her regular hemodialysis treatment.  Cardiology is consulted for further assistance.  The patient says about 3 hours into her 4-hour dialysis treatment yesterday she started having shortness of breath, and left-sided "chest heaviness" she rated a 6/10.  She had some nausea associated with the discomfort, but it did not radiate, she was not diaphoretic, and she cannot point to anything in particular that made it worse.  She is not sure if nitroglycerin improved the discomfort, but notes the morphine did.  The chest heaviness is nonexertional, and has been present at about a 6 out of 10 ever since dialysis the morning of 8/16.  She notes a similar episode at least a year ago of chest heaviness but does not remember much else about it.  She presented to the ED in April of this year with chest pain and palpitations, was noted to be in SVT, terminated with adenosine, was put on metoprolol thereafter without apparent recurrence.  She denies palpitations today or chest pain similar to that quality.  She denies dizziness, presyncope, or significant peripheral edema.  Notably, in 2021 she had an abnormal nuclear medicine stress test, subsequent left heart cath 05/2019 which showed normal coronaries with minimal calcifications near the ostium left main.  Risk factor modification was  recommended at that time.  She had a PET/CT myocardial perfusion study in September 2022 which was normal without evidence of significant ischemia or scar, LVEF was greater than 60%, coronary calcifications and aortic valve calcifications were noted.  This appears to have been performed as a work-up for potential kidney transplant.  Labs are notable for hyperkalemia potassium 5.4, BUN/creatinine 57/11.02, GFR 4.  Hemoglobin downtrending from 11.49.8, platelets 241.  Chest x-ray was negative for cardiopulmonary disease.  In the ED, her EKG showed sinus rhythm without acute ST or T wave changes, troponins are negative on multiple repeats at 5 - 4 - 5 - 5.  Review of systems complete and found to be negative unless listed above     Past Medical History:  Diagnosis Date   Acid reflux    Anemia 04/2019   REQUIRING BLOOD TRANSFUSIONS   Anxiety    Arthritis    CHF (congestive heart failure) (HCC)    Dyspnea    with exertion   Dysrhythmia    tachycardia   ESRD (end stage renal disease) ([place])    [facility]- [place]   Facial twitching 11/2020   mouth twitch, eyes roll back, neurology started patient on low dose   Sinemet IR.   GI bleeding 12/2017   Gout    Headache(784.0)    "related to chemo; sometimes weekly" (09/12/2013)   Heart murmur    Systolic per Dr Dixie Dials' notes   High cholesterol    History of blood transfusion "a couple"   "related to low counts"   Hypertension    Iron deficiency anemia    "get epogen shots q  month" (02/20/2014)   MCGN (minimal change glomerulonephritis)    "using chemo to tx;  finished my last tx in 12/2013"   Neuropathy    Peritoneal dialysis status ([place])    Stroke ([place]) 08/15/2017   ruled out that it was not a stroke    Past Surgical History:  Procedure Laterality Date   ABDOMINAL HYSTERECTOMY  2010   "laparoscopic"   ANKLE FRACTURE SURGERY Right 1994   AV FISTULA PLACEMENT Left 04/14/2016   Procedure: ARTERIOVENOUS (AV) FISTULA CREATION;   Surgeon: Angelia Mould, MD;  Location: [place];  Service: Vascular;  Laterality: Left;   AV FISTULA PLACEMENT Left 08/09/2017   Procedure: INSERTION OF ARTERIOVENOUS (AV) GORE-TEX GRAFT LEFT ARM;  Surgeon: Elam Dutch, MD;  Location: [place];  Service: Vascular;  Laterality: Left;   AV FISTULA PLACEMENT Right 03/15/2020   Procedure: RIGHT ARM ARTERIOVENOUS (AV) FISTULA CREATION 1st Stage Basilic Transposition;  Surgeon: Serafina Mitchell, MD;  Location: [place];  Service: Vascular;  Laterality: Right;   AV FISTULA PLACEMENT Right 02/24/2021   Procedure: RIGHT ARM ARTERIOVENOUS  FISTULA CREATION;  Surgeon: Broadus John, MD;  Location: [place];  Service: Vascular;  Laterality: Right;  regional block   AVGG REMOVAL Left 08/11/2017   Procedure: REMOVAL OF ARTERIOVENOUS GORETEX GRAFT ([place]);  Surgeon: Serafina Mitchell, MD;  Location: [facility] OR;  Service: Vascular;  Laterality: Left;   [place] Right 05/17/2020   Procedure: RIGHT UPPER EXTREMITY SECOND STAGE [place];  Surgeon: Serafina Mitchell, MD;  Location: [place];  Service: Vascular;  Laterality: Right;   BIOPSY  09/03/2017   Procedure: BIOPSY;  Surgeon: Otis Brace, MD;  Location: [place] ENDOSCOPY;  Service: Gastroenterology;;   BIOPSY  12/24/2017   Procedure: BIOPSY;  Surgeon: Irving Copas., MD;  Location: [place];  Service: Gastroenterology;;   CARDIAC CATHETERIZATION  2000's   COLONOSCOPY WITH PROPOFOL N/A 09/04/2017   Procedure: COLONOSCOPY WITH PROPOFOL;  Surgeon: Ronnette Juniper, MD;  Location: [place];  Service: Gastroenterology;  Laterality: N/A;   ENTEROSCOPY N/A 12/24/2017   Procedure: ENTEROSCOPY;  Surgeon: Rush Landmark Telford Nab., MD;  Location: [place];  Service: Gastroenterology;  Laterality: N/A;   ESOPHAGOGASTRODUODENOSCOPY     ESOPHAGOGASTRODUODENOSCOPY (EGD) WITH PROPOFOL Left 06/04/2017   Procedure: ESOPHAGOGASTRODUODENOSCOPY (EGD) WITH PROPOFOL;  Surgeon: Arta Silence,  MD;  Location: [place];  Service: Endoscopy;  Laterality: Left;   ESOPHAGOGASTRODUODENOSCOPY (EGD) WITH PROPOFOL N/A 09/03/2017   Procedure: ESOPHAGOGASTRODUODENOSCOPY (EGD) WITH PROPOFOL;  Surgeon: Otis Brace, MD;  Location: [place];  Service: Gastroenterology;  Laterality: N/A;   FISTULA SUPERFICIALIZATION Left 04/02/2017   Procedure: FISTULA SUPERFICIALIZATION LEFT ARM;  Surgeon: Serafina Mitchell, MD;  Location: [place];  Service: Vascular;  Laterality: Left;   FISTULA SUPERFICIALIZATION Right 04/14/2021   Procedure: RIGHT ARM FISTULA SUPERFICIALIZATION;  Surgeon: Broadus John, MD;  Location: [place];  Service: Vascular;  Laterality: Right;  PERIPHERAL NERVE BLOCK   FISTULOGRAM Left 04/07/2016   Procedure: FISTULOGRAM;  Surgeon: Waynetta Sandy, MD;  Location: [place];  Service: Vascular;  Laterality: Left;   FRACTURE SURGERY     right ankle   GIVENS CAPSULE STUDY N/A 10/29/2017   Procedure: GIVENS CAPSULE STUDY;  Surgeon: Wilford Corner, MD;  Location: [place];  Service: Endoscopy;  Laterality: N/A;   GIVENS CAPSULE STUDY N/A 12/24/2017   Procedure: GIVENS CAPSULE STUDY;  Surgeon: Irving Copas., MD;  Location: [place];  Service: Gastroenterology;  Laterality: N/A;   GIVENS  CAPSULE STUDY N/A 05/22/2018   Procedure: GIVENS CAPSULE STUDY;  Surgeon: Irving Copas., MD;  Location: [place];  Service: Gastroenterology;  Laterality: N/A;   INSERTION OF DIALYSIS CATHETER Right 04/07/2016   Procedure: INSERTION OF DIALYSIS CATHETER;  Surgeon: Waynetta Sandy, MD;  Location: [place];  Service: Vascular;  Laterality: Right;   INSERTION OF DIALYSIS CATHETER Right 04/04/2017   Procedure: INSERTION OF DIALYSIS CATHETER;  Surgeon: Waynetta Sandy, MD;  Location: [place];  Service: Vascular;  Laterality: Right;   IR FLUORO GUIDE CV LINE LEFT  03/13/2020   IR US GUIDE VASC ACCESS LEFT  03/13/2020   LEFT HEART CATH AND CORONARY ANGIOGRAPHY N/A  05/12/2019   Procedure: LEFT HEART CATH AND CORONARY ANGIOGRAPHY;  Surgeon: Dixie Dials, MD;  Location: [place] CV LAB;  Service: Cardiovascular;  Laterality: N/A;   LIGATION OF ARTERIOVENOUS  FISTULA Left 04/14/2016   Procedure: LIGATION OF ARTERIOVENOUS  FISTULA;  Surgeon: Angelia Mould, MD;  Location: [place];  Service: Vascular;  Laterality: Left;   LIGATION OF COMPETING BRANCHES OF ARTERIOVENOUS FISTULA Right 05/17/2020   Procedure: LIGATION OF COMPETING BRANCHES OF RIGHT ARM ARTERIOVENOUS FISTULA;  Surgeon: Serafina Mitchell, MD;  Location: [place];  Service: Vascular;  Laterality: Right;   PATCH ANGIOPLASTY Left 06/19/2016   Procedure: PATCH ANGIOPLASTY LEFT ARTERIOVENOUS FISTULA;  Surgeon: Angelia Mould, MD;  Location: [place];  Service: Vascular;  Laterality: Left;   POLYPECTOMY  09/04/2017   Procedure: POLYPECTOMY;  Surgeon: Ronnette Juniper, MD;  Location: [facility] ENDOSCOPY;  Service: Gastroenterology;;   REVISON OF ARTERIOVENOUS FISTULA Left 06/19/2016   Procedure: REVISION SUPERFICIALIZATION OF BRACHIOCEPHALIC ARTERIOVENOUS FISTULA;  Surgeon: Angelia Mould, MD;  Location: [place];  Service: Vascular;  Laterality: Left;   UPPER EXTREMITY VENOGRAPHY Bilateral 02/19/2021   Procedure: UPPER EXTREMITY VENOGRAPHY;  Surgeon: Broadus John, MD;  Location: [place] CV LAB;  Service: Cardiovascular;  Laterality: Bilateral;    (Not in a hospital admission)  Social History   Socioeconomic History   Marital status: Single    Spouse name: Not on file   Number of children: 3   Years of education: 14   Highest education level: Associate degree: academic program  Occupational History   Occupation: disabled  Tobacco Use   Smoking status: Never   Smokeless tobacco: Never  Vaping Use   Vaping Use: Never used  Substance and Sexual Activity   Alcohol use: No   Drug use: Never   Sexual activity: Not Currently    Birth control/protection: Surgical  Other Topics Concern   Not on  file  Social History Narrative   Not on file   Social Determinants of Health   Financial Resource Strain: Low Risk  (04/04/2018)   Overall Financial Resource Strain (CARDIA)    Difficulty of Paying Living Expenses: Not hard at all  Food Insecurity: Food Insecurity Present (04/04/2018)   Hunger Vital Sign    Worried About Running Out of Food in the Last Year: Sometimes true    Ran Out of Food in the Last Year: Never true  Transportation Needs: No Transportation Needs (04/04/2018)   PRAPARE - Hydrologist (Medical): No    Lack of Transportation (Non-Medical): No  Physical Activity: Inactive (04/04/2018)   Exercise Vital Sign    Days of Exercise per Week: 0 days    Minutes of Exercise per Session: 0 min  Stress: No Stress Concern Present (04/04/2018)   [employer] of  Occupational Health - Occupational Stress Questionnaire    Feeling of Stress : Only a little  Social Connections: Somewhat Isolated (04/04/2018)   Social Connection and Isolation Panel [NHANES]    Frequency of Communication with Friends and Family: More than three times a week    Frequency of Social Gatherings with Friends and Family: More than three times a week    Attends Religious Services: More than 4 times per year    Active Member of [employer] or Organizations: No    Attends Archivist Meetings: Never    Marital Status: Never married  Intimate Partner Violence: Not At Risk (09/02/2017)   Humiliation, Afraid, Rape, and Kick questionnaire    Fear of Current or Ex-Partner: No    Emotionally Abused: No    Physically Abused: No    Sexually Abused: No    Family History  Problem Relation Age of Onset   Hypertension Mother    Thyroid disease Mother    Coronary artery disease Father    Hypertension Father    Diabetes Father    Colon cancer Maternal Aunt    Esophageal cancer Maternal Uncle    Inflammatory bowel disease Neg Hx    Liver disease Neg Hx    Pancreatic cancer Neg Hx     Rectal cancer Neg Hx    Stomach cancer Neg Hx       PHYSICAL EXAM General: Middle-aged black female, well nourished, in no acute distress. HEENT:  Normocephalic and atraumatic. Neck:  No JVD.  Lungs: Normal respiratory effort on room air. Clear bilaterally to auscultation. No wheezes, crackles, rhonchi.  Heart: HRRR . Normal S1 and S2 without gallops or murmurs.  Abdomen: Obese appearing.  Msk: Normal strength and tone for age. Extremities: Warm and well perfused. No clubbing, cyanosis.  No peripheral edema.  Neuro: Alert and oriented X 3. Psych: Flat affect.  Answers questions appropriately.   Labs:   Lab Results  Component Value Date   WBC 6.3 08/22/2021   HGB 9.8 (L) 08/22/2021   HCT 32.4 (L) 08/22/2021   MCV 84.2 08/22/2021   PLT 241 08/22/2021    Recent Labs  Lab 08/21/21 1003 08/22/21 0548  NA 139 137  K 4.7 5.4*  CL 96* 99  CO2 29 26  BUN 38* 57*  CREATININE 9.06* 11.02*  CALCIUM 8.7* 8.1*  PROT 8.6*  --   BILITOT 0.7  --   ALKPHOS 113  --   ALT 9  --   AST 21  --   GLUCOSE 90 110*   Lab Results  Component Value Date   CKTOTAL 74 01/13/2021   CKMB 2.5 07/04/2011   TROPONINI <0.03 05/11/2016    Lab Results  Component Value Date   CHOL 199 05/11/2019   CHOL 196 06/18/2015   CHOL  02/26/2010    175        ATP III CLASSIFICATION:  <200     mg/dL   Desirable  200-239  mg/dL   Borderline High  >=240    mg/dL   High          Lab Results  Component Value Date   HDL 37 (L) 05/11/2019   HDL 34 (L) 06/18/2015   HDL 33 (L) 02/26/2010   Lab Results  Component Value Date   LDLCALC 135 (H) 05/11/2019   LDLCALC 109 (H) 06/18/2015   LDLCALC (H) 02/26/2010    118        Total Cholesterol/HDL:CHD Risk Coronary Heart Disease  Risk Table                     Men   Women  1/2 Average Risk   3.4   3.3  Average Risk       5.0   4.4  2 X Average Risk   9.6   7.1  3 X Average Risk  23.4   11.0        Use the calculated Patient Ratio above and the CHD  Risk Table to determine the patient's CHD Risk.        ATP III CLASSIFICATION (LDL):  <100     mg/dL   Optimal  100-129  mg/dL   Near or Above                    Optimal  130-159  mg/dL   Borderline  160-189  mg/dL   High  >190     mg/dL   Very High   Lab Results  Component Value Date   TRIG 136 05/11/2019   TRIG 265 (H) 06/18/2015   TRIG 122 02/26/2010   Lab Results  Component Value Date   CHOLHDL 5.4 05/11/2019   CHOLHDL 5.8 06/18/2015   CHOLHDL 5.3 02/26/2010   No results found for: "LDLDIRECT"    Radiology: DG Chest 2 View  Result Date: 08/21/2021 CLINICAL DATA:  Chest pain EXAM: CHEST - 2 VIEW COMPARISON:  04/26/2021 FINDINGS: The heart size and mediastinal contours are within normal limits. Both lungs are clear. The visualized skeletal structures are unremarkable. IMPRESSION: Negative. Electronically Signed   By: Rolm Baptise M.D.   On: 08/21/2021 10:52    09/30/2020 This result has an attachment that is not available.  EXAM: PET MYOCARDIAL PERFUSION MULTIPLE   IMPRESSIONS:  __________________________________________________________________  - Normal myocardial perfusion study  - No evidence of any significant ischemia or scar  - Left ventricular systolic function is normal. Post stress the ejection  fraction is > 60%.  - Coronary calcifications are noted  - Aortic valve calcifications are noted  - Also noted on attenuation CT is LIJ catheter that terminates in the  right atrium.   __________________________________________________________________   Procedure date: 2020-09-30  Attending: Sarina Ser   Fellows: Rexene Edison, Aaron Edelman P  Indication: pre-op renal transplant  Risk factors: CKD, diabetes, hypertension    Protocol: The patient was brought to the PET/CT laboratory and underwent  an attenuation CT scan. The patient was given 40 mCi of rubidium Rb-82  intravenously and had the resting images performed. The patient was given  a  0.4 mg dose of regadenoson intravenously over 15 seconds. After the IV  administration of a total of 40 mCi of rubidium Rb-82, stress images were  acquired. Following completion of stress imaging, the patient underwent an  attenuation CT scan with the PET/CT scanner.   Attenuation issues: breast   Quality issues:  Motion: mild     Adjacent gut activity: moderate      Overall quality: fair  Height: 170.2 cm (67 in)   Weight: 102.5 KG (225.5 LB)    BMI: 35.4      BSA: (2.20)  Resting BP: 131/77     HR: 65    Stress BP: 122/60    HR: 86    Max HR for  age: 10     % Max HR achieved: 50%  Resting EKG: normal sinus rhythm  Stress EKG: no significant changes over  baseline  Stress symptoms: typical regadenoson symptoms   Nuclear findings: Perfusion images demonstrate homogeneous tracer  distribution throughout the myocardium with no stress induced perfusion  abnormalities.   Ventricular findings: Left ventricular systolic function is normal. Post  stress the ejection fraction is > 60%.    I (Muhleman, Wonda Cheng) was available for the stress portion of the  study.    Record ID: [id number]   ECHO 09/30/2020  Summary    1. The left ventricle is normal in size with normal wall thickness.    2. The left ventricular systolic function is normal, LVEF is visually  estimated at > 55%.    3. The left atrium is mildly dilated in size.    4. The right ventricle is normal in size, with low normal systolic function.    Left Ventricle    The left ventricle is normal in size with normal wall thickness.    The left ventricular systolic function is normal, LVEF is visually estimated  at > 55%.    There is normal left ventricular diastolic function.   Right Ventricle    The right ventricle is normal in size, with low normal systolic function.    Left Atrium    The left atrium is mildly dilated in size.   Right Atrium    The right atrium is normal  in size.    Aortic Valve    The  aortic valve is trileaflet with normal appearing leaflets with normal  excursion.    There is no significant aortic regurgitation.    There is no evidence of a significant transvalvular gradient.   Pulmonic Valve    The pulmonic valve is normal.    There is no significant pulmonic regurgitation.    There is no evidence of a significant transvalvular gradient.   Mitral Valve    The mitral valve leaflets are normal with normal leaflet mobility.    There is no significant mitral valve regurgitation.   Tricuspid Valve    The tricuspid valve leaflets are normal, with normal leaflet mobility.    There is mild tricuspid regurgitation.    There is no pulmonary hypertension.    Pericardium/Pleural    There is no pericardial effusion.   Inferior Vena Cava    IVC size and inspiratory change suggest normal right atrial pressure. (0-5  mmHg).   Aorta    The aorta is normal in size in the visualized segments.    Pulmonic Valve  ----------------------------------------------------------------------  Name                                 Value        Normal  ----------------------------------------------------------------------   PV Doppler  ----------------------------------------------------------------------  PV Peak Velocity                   0.9 m/s   Mitral Valve  ----------------------------------------------------------------------  Name                                 Value        Normal  ----------------------------------------------------------------------   MV Diastolic Function  ----------------------------------------------------------------------  MV E Peak Velocity                 55 cm/s                MV A  Peak Velocity                 69 cm/s                MV E/A                                 0.8                 MV Annular TDI  ----------------------------------------------------------------------  MV Septal e' Velocity             5.3 cm/s         >=8.0  MV  Lateral e' Velocity           13.1 cm/s        >=10.0  MV e' Average                          9.2                MV E/e' (Average)                      7.3   Tricuspid Valve  ----------------------------------------------------------------------  Name                                 Value        Normal  ----------------------------------------------------------------------   TV Regurgitation Doppler  ----------------------------------------------------------------------  TR Peak Velocity                     2 m/s                 Estimated PAP/RSVP  ----------------------------------------------------------------------  RA Pressure                         3 mmHg           <=5  RV Systolic Pressure               27 mmHg           <36   Aorta  ----------------------------------------------------------------------  Name                                 Value        Normal  ----------------------------------------------------------------------   Ascending Aorta  ----------------------------------------------------------------------  Ao Root Diameter (2D)               2.8 cm                Ao Root Diam Index (2D)         12.5 cm/m2   Venous  ----------------------------------------------------------------------  Name                                 Value        Normal  ----------------------------------------------------------------------   IVC/SVC  ----------------------------------------------------------------------  IVC Diameter (Exp 2D)               1.5 cm         <=2.1   Aortic Valve  ----------------------------------------------------------------------  Name  Value        Normal  ----------------------------------------------------------------------   AV Doppler  ----------------------------------------------------------------------  AV Peak Velocity                   1.3 m/s   Ventricles   ----------------------------------------------------------------------  Name                                 Value        Normal  ----------------------------------------------------------------------   LV Dimensions 2D/MM  ----------------------------------------------------------------------  IVS Diastolic Thickness (2D)        0.8 cm       0.6-0.9  LVID Diastole (2D)                  4.1 cm       0.2-5.8  LVPW Diastolic Thickness  (2D)                                0.8 cm       0.6-0.9   LVID Systole (2D)                   2.9 cm       2.2-3.5   RV Dimensions 2D/MM  ----------------------------------------------------------------------  RV Basal Diastolic Dimension        4.5 cm       2.5-4.1  TAPSE                               1.7 cm         >=1.7   Atria  ----------------------------------------------------------------------  Name                                 Value        Normal  ----------------------------------------------------------------------   LA Dimensions  ----------------------------------------------------------------------  LA Dimension (2D)                   4.1 cm       2.7-3.8  LA Volume (BP MOD)                   64 ml                LA Volume Index (BP MOD)       28.68 ml/m2   16.00-34.00   RA Dimensions  ----------------------------------------------------------------------  RA Area (4C)                      12.1 cm2        <=18.0  RA Area (4C) Index              5.4 cm2/m2    Report Signatures  Finalized by Stephani Police  MD on 10/02/2020 03:12 PM  TELEMETRY reviewed by me: Sinus rhythm rate 60s to 70s  EKG reviewed by me: Sinus rhythm 63 without acute ST or T wave changes  ASSESSMENT AND PLAN:  Cortez Steelman is a 12yoF with a PMH of ESRD on TTS dialysis, abnormal NM stress test and subsequent LHC 05/2019 with normal coronaries, minimal calcification near ostium left main, paroxysmal SVT, dyslipidemia, morbid obesity who presented [facility] ED  08/21/2021 with chest pain and heaviness associated with  shortness of breath during her regular hemodialysis treatment.  Cardiology is consulted for further assistance.  #Atypical chest pain The patient presents with 6 out of 10 left-sided chest heaviness associated with shortness of breath that occurred toward the end of her dialysis treatment on 8/17.  The discomfort has been constant, nonradiating, nonexertional, and only somewhat relieved with morphine (unsure if nitroglycerin helped).  She had a left heart cath in 2021 which showed normal coronaries and minimal full ostial left main calcifications, and subsequent PET myocardial perfusion study 09/2020 which was without scar or ischemia and showed preserved LVEF.  Her troponins are negative this admission at 5 - 4 - 5 - 5, and EKG is nonacute. -Consider other noncardiac etiologies of the patient's chest heaviness -S/p 325 mg aspirin -Defer heparin drip -Continue metoprolol XL 50 mg once daily -Continue Zetia 10 mg once daily -Recommend further risk factor modification, weight loss -No further cardiac diagnostics necessary, okay for discharge from a cardiac standpoint today.  Can follow-up with Dr. Saralyn Pilar in 1 to 2 weeks.  This patient's plan of care was discussed and created with Dr. Saralyn Pilar and he is in agreement.  Signed: Tristan Schroeder , PA-C 08/22/2021, 11:10 AM [facility]

## 2021-08-22 NOTE — Hospital Course (Signed)
46 year old female with past medical history of hypertension, morbid obesity, diastolic heart failure and end-stage renal disease on hemodialysis who presented to the emergency department on 8/17 with 7/10 chest heaviness.  EKG noted T wave inversions in lateral and V1 leads.  Patient brought in for further evaluation with cardiology and nephrology consulted.

## 2021-08-22 NOTE — ED Notes (Signed)
Lilly PA at bedside

## 2021-08-22 NOTE — Consult Note (Signed)
[facility]  CONSULT NOTE    Date: 08/22/2021                  Patient Name:  Patricia [name]  MRN: [id number]  DOB: 29-Mar-1975  Age / Sex: 46 y.o., female         PCP: Doyle Askew, PA-C                 Service Requesting Consult: [place]                 Reason for Consult: End stage renal disease            History of Present Illness: Patricia [name] is a 46 y.o.  female with past medical history including CHF, GERD, hypertension, dyslipidemia, gout, and end-stage renal disease on hemodialysis, who was admitted to [facility] on 08/21/2021 for ESRD on hemodialysis ([place]) [N18.6, Z99.2] Chest pain [R07.9] Chest pain with moderate risk for cardiac etiology [R07.9]  Patient currently receives outpatient dialysis treatments at [facility] on a TTS schedule, supervised by [facility] physicians).  Patient reports being at her treatment yesterday and after 3 hours, she began to feel short of breath and experienced some chest pressure.  Patient states that they had managed to pull 4.1 L of fluid until that time.  Patient unsure of how much fluid was being pulled in all.  Patient states she continues to have this chest tightness today.  Shortness of breath has resolved.  Denies nausea denies nausea, vomiting, or diarrhea.  Denies known fever or chills.  Labs on ED arrival unremarkable for renal patient.  Troponin 5.   Medications: Outpatient medications: Medications Prior to Admission  Medication Sig Dispense Refill Last Dose   allopurinol (ZYLOPRIM) 100 MG tablet Take 100 mg by mouth at bedtime.    08/20/2021 at pm   ALPRAZolam (XANAX) 1 MG tablet Take 1 mg by mouth 2 (two) times daily as needed for anxiety or sleep.   08/20/2021   carbidopa-levodopa (SINEMET IR) 25-100 MG tablet TAKE 1 TABLET BY MOUTH THREE TIMES DAILY 90 tablet 1 08/20/2021 at 2100   DULoxetine (CYMBALTA) 30 MG capsule Take 1 capsule (30 mg total) by mouth  daily. 30 capsule 11 08/20/2021   ezetimibe (ZETIA) 10 MG tablet Take 10 mg by mouth daily.   08/20/2021   gabapentin (NEURONTIN) 100 MG capsule Take 3 capsules (300 mg total) by mouth 3 (three) times daily. 270 capsule 11 08/20/2021   hydrOXYzine (ATARAX/VISTARIL) 25 MG tablet Take 25 mg by mouth every 6 (six) hours as needed for itching.   08/20/2021   lanthanum (FOSRENOL) 1000 MG chewable tablet Chew 1,000-3,000 mg by mouth See admin instructions. Take 3000 mg with each meal and 1000-2000 mg with each snack   08/20/2021   metoprolol succinate (TOPROL-XL) 50 MG 24 hr tablet Take 50 mg by mouth every evening. Take with or immediately following a meal.   08/20/2021 at pm   multivitamin (RENA-VIT) TABS tablet Take 1 tablet by mouth at bedtime.    08/20/2021 at pm   omeprazole (PRILOSEC) 40 MG capsule Take 40 mg by mouth in the morning and at bedtime.   08/20/2021 at pm   vortioxetine HBr (TRINTELLIX) 5 MG TABS tablet Take 5 mg by mouth daily.   08/20/2021 at pm   zolpidem (AMBIEN) 5 MG tablet Take 5 mg by mouth at bedtime.   08/20/2021 at pm   acetaminophen (TYLENOL) 500 MG tablet  Take 1,000 mg by mouth every 6 (six) hours as needed for moderate pain.   prn at prn   Diclofenac Sodium 1 % CREA 1 gram qid prn 120 g 11 prn at prn   incobotulinumtoxinA (XEOMIN) 50 units SOLR injection Inject 50 Units into the muscle every 3 (three) months. 1 each 3    lidocaine-prilocaine (EMLA) cream 1gram tid prn 30 g 11 prn at prn   ondansetron (ZOFRAN-ODT) 4 MG disintegrating tablet Take 4 mg by mouth 2 (two) times daily as needed for nausea/vomiting.   prn at prn    Current medications: Current Facility-Administered Medications  Medication Dose Route Frequency Provider Last Rate Last Admin   acetaminophen (TYLENOL) tablet 650 mg  650 mg Oral Q6H PRN Mansy, Jan A, MD       Or   acetaminophen (TYLENOL) suppository 650 mg  650 mg Rectal Q6H PRN Mansy, Jan A, MD       allopurinol (ZYLOPRIM) tablet 100 mg  100 mg Oral QHS  Mansy, Jan A, MD   100 mg at 08/21/21 2208   ALPRAZolam Duanne Moron) tablet 1 mg  1 mg Oral Daily Mansy, Jan A, MD   1 mg at 08/22/21 1132   carbidopa-levodopa (SINEMET IR) 25-100 MG per tablet immediate release 1 tablet  1 tablet Oral TID Mansy, Jan A, MD   1 tablet at 08/22/21 1134   docusate sodium (COLACE) capsule 200 mg  200 mg Oral BID Annita Brod, MD   200 mg at 08/22/21 1301   DULoxetine (CYMBALTA) DR capsule 30 mg  30 mg Oral Daily Mansy, Jan A, MD   30 mg at 08/22/21 1132   ezetimibe (ZETIA) tablet 10 mg  10 mg Oral Daily Mansy, Jan A, MD   10 mg at 08/22/21 1146   gabapentin (NEURONTIN) capsule 300 mg  300 mg Oral TID Mansy, Jan A, MD   300 mg at 08/22/21 1131   heparin injection 5,000 Units  5,000 Units Subcutaneous Q8H Annita Brod, MD       hydrOXYzine (ATARAX) tablet 25 mg  25 mg Oral Q6H PRN Mansy, Jan A, MD   25 mg at 08/22/21 1301   lanthanum (FOSRENOL) chewable tablet 1,000 mg  1,000 mg Oral With snacks Beers, Shanon Brow, RPH       lanthanum (FOSRENOL) chewable tablet 3,000 mg  3,000 mg Oral TID with meals Mansy, Jan A, MD       lanthanum [facility]) chewable tablet 3,000 mg  3,000 mg Oral TID PRN Darrin Luis D, RPH   3,000 mg at 08/22/21 1133   metoprolol succinate (TOPROL-XL) 24 hr tablet 50 mg  50 mg Oral QPM Mansy, Jan A, MD   50 mg at 08/21/21 2112   morphine (PF) 2 MG/ML injection 2 mg  2 mg Intravenous Q2H PRN Mansy, Jan A, MD   2 mg at 08/21/21 2011   multivitamin (RENA-VIT) tablet 1 tablet  1 tablet Oral QHS Mansy, Jan A, MD   1 tablet at 08/21/21 2209   nitroGLYCERIN (NITROSTAT) SL tablet 0.4 mg  0.4 mg Sublingual Q5 min PRN Carrie Mew, MD   0.4 mg at 08/21/21 2106   ondansetron (ZOFRAN) tablet 4 mg  4 mg Oral Q6H PRN Mansy, Jan A, MD       Or   ondansetron [facility]) injection 4 mg  4 mg Intravenous Q6H PRN Mansy, Jan A, MD   4 mg at 08/21/21 2010   pantoprazole (PROTONIX) EC tablet 40 mg  40 mg Oral Daily Mansy, Jan A, MD   40 mg at 08/22/21 1134    traMADol (ULTRAM) tablet 50 mg  50 mg Oral Q6H PRN Hollice Espy, MD   50 mg at 08/22/21 1424   traZODone (DESYREL) tablet 25 mg  25 mg Oral QHS PRN Mansy, Jan A, MD   25 mg at 08/22/21 0321   vortioxetine HBr (TRINTELLIX) tablet 5 mg  5 mg Oral Daily Mansy, Jan A, MD   5 mg at 08/22/21 1134      Allergies: Allergies  Allergen Reactions   Nsaids Other (See Comments)    Cannot take due to Kidney disease/Kidney function    Tolmetin Other (See Comments)    Cannot take due to Kidney Disease      Past Medical History: Past Medical History:  Diagnosis Date   Acid reflux    Anemia 04/2019   REQUIRING BLOOD TRANSFUSIONS   Anxiety    Arthritis    CHF (congestive heart failure) (HCC)    Dyspnea    with exertion   Dysrhythmia    tachycardia   ESRD (end stage renal disease) (HCC)    [facility]   Facial twitching 11/2020   mouth twitch, eyes roll back, neurology started patient on low dose   Sinemet IR.   GI bleeding 12/2017   Gout    Headache(784.0)    "related to chemo; sometimes weekly" (09/12/2013)   Heart murmur    Systolic per Dr Orpah Cobb' notes   High cholesterol    History of blood transfusion "a couple"   "related to low counts"   Hypertension    Iron deficiency anemia    "get epogen shots q month" (02/20/2014)   MCGN (minimal change glomerulonephritis)    "using chemo to tx;  finished my last tx in 12/2013"   Neuropathy    Peritoneal dialysis status (HCC)    Stroke (HCC) 08/15/2017   ruled out that it was not a stroke     Past Surgical History: Past Surgical History:  Procedure Laterality Date   ABDOMINAL HYSTERECTOMY  2010   "laparoscopic"   ANKLE FRACTURE SURGERY Right 1994   AV FISTULA PLACEMENT Left 04/14/2016   Procedure: ARTERIOVENOUS (AV) FISTULA CREATION;  Surgeon: Chuck Hint, MD;  Location: [facility] OR;  Service: Vascular;  Laterality: Left;   AV FISTULA PLACEMENT Left 08/09/2017   Procedure: INSERTION OF ARTERIOVENOUS (AV) GORE-TEX  GRAFT LEFT ARM;  Surgeon: Sherren Kerns, MD;  Location: [facility] OR;  Service: Vascular;  Laterality: Left;   AV FISTULA PLACEMENT Right 03/15/2020   Procedure: RIGHT ARM ARTERIOVENOUS (AV) FISTULA CREATION 1st Stage Basilic Transposition;  Surgeon: Nada Libman, MD;  Location: [facility] OR;  Service: Vascular;  Laterality: Right;   AV FISTULA PLACEMENT Right 02/24/2021   Procedure: RIGHT ARM ARTERIOVENOUS  FISTULA CREATION;  Surgeon: Victorino Sparrow, MD;  Location: [facility] OR;  Service: Vascular;  Laterality: Right;  regional block   AVGG REMOVAL Left 08/11/2017   Procedure: REMOVAL OF ARTERIOVENOUS GORETEX GRAFT (AVGG);  Surgeon: Nada Libman, MD;  [facility] OR;  Service: Vascular;  Laterality: Left;   BASCILIC VEIN TRANSPOSITION Right 05/17/2020   Procedure: RIGHT UPPER EXTREMITY SECOND STAGE BASCILIC VEIN TRANSPOSITION;  Surgeon: Nada Libman, MD;  Location: [facility] OR;  Service: Vascular;  Laterality: Right;   BIOPSY  09/03/2017   Procedure: BIOPSY;  Surgeon: Kathi Der, MD;  Location: [facility] ENDOSCOPY;  Service: Gastroenterology;;   BIOPSY  12/24/2017  Procedure: BIOPSY;  Surgeon: Lemar Lofty., MD;  Location: [facility] ENDOSCOPY;  Service: Gastroenterology;;   CARDIAC CATHETERIZATION  2000's   COLONOSCOPY WITH PROPOFOL N/A 09/04/2017   Procedure: COLONOSCOPY WITH PROPOFOL;  Surgeon: Kerin Salen, MD;  Location: [facility] ENDOSCOPY;  Service: Gastroenterology;  Laterality: N/A;   ENTEROSCOPY N/A 12/24/2017   Procedure: ENTEROSCOPY;  Surgeon: Meridee Score Netty Starring., MD;  Location: [facility] ENDOSCOPY;  Service: Gastroenterology;  Laterality: N/A;   ESOPHAGOGASTRODUODENOSCOPY     ESOPHAGOGASTRODUODENOSCOPY (EGD) WITH PROPOFOL Left 06/04/2017   Procedure: ESOPHAGOGASTRODUODENOSCOPY (EGD) WITH PROPOFOL;  Surgeon: Willis Modena, MD;  Location[facility] ENDOSCOPY;  Service: Endoscopy;  Laterality: Left;   ESOPHAGOGASTRODUODENOSCOPY (EGD) WITH PROPOFOL N/A 09/03/2017   Procedure: ESOPHAGOGASTRODUODENOSCOPY (EGD) WITH  PROPOFOL;  Surgeon: Kathi Der, MD;  Location: [facility] ENDOSCOPY;  Service: Gastroenterology;  Laterality: N/A;   FISTULA SUPERFICIALIZATION Left 04/02/2017   Procedure: FISTULA SUPERFICIALIZATION LEFT ARM;  Surgeon: Nada Libman, MD;  Location: [facility] OR;  Service: Vascular;  Laterality: Left;   FISTULA SUPERFICIALIZATION Right 04/14/2021   Procedure: RIGHT ARM FISTULA SUPERFICIALIZATION;  Surgeon: Victorino Sparrow, MD;  Location: [facility] OR;  Service: Vascular;  Laterality: Right;  PERIPHERAL NERVE BLOCK   FISTULOGRAM Left 04/07/2016   Procedure: FISTULOGRAM;  Surgeon: Maeola Harman, MD;  Location: [facility] OR;  Service: Vascular;  Laterality: Left;   FRACTURE SURGERY     right ankle   GIVENS CAPSULE STUDY N/A 10/29/2017   Procedure: GIVENS CAPSULE STUDY;  Surgeon: Charlott Rakes, MD;  [facility] ENDOSCOPY;  Service: Endoscopy;  Laterality: N/A;   GIVENS CAPSULE STUDY N/A 12/24/2017   Procedure: GIVENS CAPSULE STUDY;  Surgeon: Lemar Lofty., MD;  Location: [facility] ENDOSCOPY;  Service: Gastroenterology;  Laterality: N/A;   GIVENS CAPSULE STUDY N/A 05/22/2018   Procedure: GIVENS CAPSULE STUDY;  Surgeon: Lemar Lofty., MD;  Location: [facility] ENDOSCOPY;  Service: Gastroenterology;  Laterality: N/A;   INSERTION OF DIALYSIS CATHETER Right 04/07/2016   Procedure: INSERTION OF DIALYSIS CATHETER;  Surgeon: Maeola Harman, MD;  Location: [facility] OR;  Service: Vascular;  Laterality: Right;   INSERTION OF DIALYSIS CATHETER Right 04/04/2017   Procedure: INSERTION OF DIALYSIS CATHETER;  Surgeon: Maeola Harman, MD;  Location: [facility] OR;  Service: Vascular;  Laterality: Right;   IR FLUORO GUIDE CV LINE LEFT  03/13/2020   IR US GUIDE VASC ACCESS LEFT  03/13/2020   LEFT HEART CATH AND CORONARY ANGIOGRAPHY N/A 05/12/2019   Procedure: LEFT HEART CATH AND CORONARY ANGIOGRAPHY;  Surgeon: Orpah Cobb, MD;  [facility] INVASIVE CV LAB;  Service: Cardiovascular;  Laterality: N/A;   LIGATION OF  ARTERIOVENOUS  FISTULA Left 04/14/2016   Procedure: LIGATION OF ARTERIOVENOUS  FISTULA;  Surgeon: Chuck Hint, MD;  Location: [facility] OR;  Service: Vascular;  Laterality: Left;   LIGATION OF COMPETING BRANCHES OF ARTERIOVENOUS FISTULA Right 05/17/2020   Procedure: LIGATION OF COMPETING BRANCHES OF RIGHT ARM ARTERIOVENOUS FISTULA;  Surgeon: Nada Libman, MD;  Location: [facility] OR;  Service: Vascular;  Laterality: Right;   PATCH ANGIOPLASTY Left 06/19/2016   Procedure: PATCH ANGIOPLASTY LEFT ARTERIOVENOUS FISTULA;  Surgeon: Chuck Hint, MD;  Location: [facility] OR;  Service: Vascular;  Laterality: Left;   POLYPECTOMY  09/04/2017   Procedure: POLYPECTOMY;  Surgeon: Kerin Salen, MD;  Location: [facility] ENDOSCOPY;  Service: Gastroenterology;;   REVISON OF ARTERIOVENOUS FISTULA Left 06/19/2016   Procedure: REVISION SUPERFICIALIZATION OF BRACHIOCEPHALIC ARTERIOVENOUS FISTULA;  Surgeon: Chuck Hint, MD;  Location: [facility] OR;  Service: Vascular;  Laterality: Left;  UPPER EXTREMITY VENOGRAPHY Bilateral 02/19/2021   Procedure: UPPER EXTREMITY VENOGRAPHY;  Surgeon: Broadus John, MD;  Location: [facility] CV LAB;  Service: Cardiovascular;  Laterality: Bilateral;     Family History: Family History  Problem Relation Age of Onset   Hypertension Mother    Thyroid disease Mother    Coronary artery disease Father    Hypertension Father    Diabetes Father    Colon cancer Maternal Aunt    Esophageal cancer Maternal Uncle    Inflammatory bowel disease Neg Hx    Liver disease Neg Hx    Pancreatic cancer Neg Hx    Rectal cancer Neg Hx    Stomach cancer Neg Hx      Social History: Social History   Socioeconomic History   Marital status: Single    Spouse name: Not on file   Number of children: 3   Years of education: 14   Highest education level: Associate degree: academic program  Occupational History   Occupation: disabled  Tobacco Use   Smoking status: Never   Smokeless tobacco: Never   Vaping Use   Vaping Use: Never used  Substance and Sexual Activity   Alcohol use: No   Drug use: Never   Sexual activity: Not Currently    Birth control/protection: Surgical  Other Topics Concern   Not on file  Social History Narrative   Not on file   Social Determinants of Health   Financial Resource Strain: Low Risk  (04/04/2018)   Overall Financial Resource Strain (CARDIA)    Difficulty of Paying Living Expenses: Not hard at all  Food Insecurity: Food Insecurity Present (04/04/2018)   Hunger Vital Sign    Worried About Running Out of Food in the Last Year: Sometimes true    Ran Out of Food in the Last Year: Never true  Transportation Needs: No Transportation Needs (04/04/2018)   PRAPARE - Hydrologist (Medical): No    Lack of Transportation (Non-Medical): No  Physical Activity: Inactive (04/04/2018)   Exercise Vital Sign    Days of Exercise per Week: 0 days    Minutes of Exercise per Session: 0 min  Stress: No Stress Concern Present (04/04/2018)   [place]    Feeling of Stress : Only a little  Social Connections: Somewhat Isolated (04/04/2018)   Social Connection and Isolation Panel [NHANES]    Frequency of Communication with Friends and Family: More than three times a week    Frequency of Social Gatherings with Friends and Family: More than three times a week    Attends Religious Services: More than 4 times per year    Active Member of [employer] or Organizations: No    Attends Archivist Meetings: Never    Marital Status: Never married  Intimate Partner Violence: Not At Risk (09/02/2017)   Humiliation, Afraid, Rape, and Kick questionnaire    Fear of Current or Ex-Partner: No    Emotionally Abused: No    Physically Abused: No    Sexually Abused: No     Review of Systems: Review of Systems  Constitutional:  Negative for chills, fever and malaise/fatigue.  HENT:   Negative for congestion, sore throat and tinnitus.   Eyes:  Negative for blurred vision and redness.  Respiratory:  Positive for shortness of breath. Negative for cough and wheezing.   Cardiovascular:  Positive for chest pain. Negative for palpitations, claudication and leg swelling.  Gastrointestinal:  Negative for abdominal pain, blood in stool, diarrhea, nausea and vomiting.  Genitourinary:  Negative for flank pain, frequency and hematuria.  Musculoskeletal:  Negative for back pain, falls and myalgias.  Skin:  Negative for rash.  Neurological:  Negative for dizziness, weakness and headaches.  Endo/Heme/Allergies:  Does not bruise/bleed easily.  Psychiatric/Behavioral:  Negative for depression. The patient is not nervous/anxious and does not have insomnia.     Vital Signs: Blood pressure (!) 147/85, pulse 68, temperature 98.2 F (36.8 C), resp. rate 18, height $RemoveBe'5\' 7"'[id number]$  (1.702 m), weight 111 kg, SpO2 100 %.  Weight trends: Filed Weights   08/21/21 0941  Weight: 111 kg    Physical Exam: General: NAD, resting comfortably  Head: Normocephalic, atraumatic. Moist oral mucosal membranes  Eyes: Anicteric  Lungs:  Clear to auscultation, normal effort, room air  Heart: Regular rate and rhythm  Abdomen:  Soft, nontender, obese  Extremities: Trace peripheral edema.  Neurologic: Nonfocal, moving all four extremities  Skin: No lesions  Access: Right AVF     Lab results: Basic Metabolic Panel: Recent Labs  Lab 08/21/21 1003 08/22/21 0548  NA 139 137  K 4.7 5.4*  CL 96* 99  CO2 29 26  GLUCOSE 90 110*  BUN 38* 57*  CREATININE 9.06* 11.02*  CALCIUM 8.7* 8.1*    Liver Function Tests: Recent Labs  Lab 08/21/21 1003  AST 21  ALT 9  ALKPHOS 113  BILITOT 0.7  PROT 8.6*  ALBUMIN 4.1   Recent Labs  Lab 08/21/21 1003  LIPASE 49   No results for input(s): "AMMONIA" in the last 168 hours.  CBC: Recent Labs  Lab 08/21/21 1003 08/22/21 0548  WBC 6.0 6.3  HGB 11.4* 9.8*   HCT 37.8 32.4*  MCV 83.3 84.2  PLT 246 241    Cardiac Enzymes: No results for input(s): "CKTOTAL", "CKMB", "CKMBINDEX", "TROPONINI" in the last 168 hours.  BNP: Invalid input(s): "POCBNP"  CBG: No results for input(s): "GLUCAP" in the last 168 hours.  Microbiology: Results for orders placed or performed during the hospital encounter of 05/15/20  SARS CORONAVIRUS 2 (TAT 6-24 HRS) Nasopharyngeal Nasopharyngeal Swab     Status: None   Collection Time: 05/15/20 10:36 AM   Specimen: Nasopharyngeal Swab  Result Value Ref Range Status   SARS Coronavirus 2 NEGATIVE NEGATIVE Final    Comment: (NOTE) SARS-CoV-2 target nucleic acids are NOT DETECTED.  The SARS-CoV-2 RNA is generally detectable in upper and lower respiratory specimens during the acute phase of infection. Negative results do not preclude SARS-CoV-2 infection, do not rule out co-infections with other pathogens, and should not be used as the sole basis for treatment or other patient management decisions. Negative results must be combined with clinical observations, patient history, and epidemiological information. The expected result is Negative.  Fact Sheet for Patients: SugarRoll.be  Fact Sheet for Healthcare Providers: https://www.woods-mathews.com/  This test is not yet approved or cleared by the [place] FDA and  has been authorized for detection and/or diagnosis of SARS-CoV-2 by FDA under an Emergency Use Authorization (EUA). This EUA will remain  in effect (meaning this test can be used) for the duration of the COVID-19 declaration under Se ction 564(b)(1) of the Act, 21 U.S.C. section 360bbb-3(b)(1), unless the authorization is terminated or revoked sooner.  Performed at [facility], [place] [address] [place], [place] [address]     Coagulation Studies: No results for input(s): "LABPROT", "INR" in the last 72 hours.  Urinalysis: No results for  input(s): "COLORURINE", "LABSPEC", "PHURINE", "GLUCOSEU", "HGBUR", "BILIRUBINUR", "KETONESUR", "PROTEINUR", "UROBILINOGEN", "NITRITE", "LEUKOCYTESUR" in the last 72 hours.  Invalid input(s): "APPERANCEUR"    Imaging: DG Chest 2 View  Result Date: 08/21/2021 CLINICAL DATA:  Chest pain EXAM: CHEST - 2 VIEW COMPARISON:  04/26/2021 FINDINGS: The heart size and mediastinal contours are within normal limits. Both lungs are clear. The visualized skeletal structures are unremarkable. IMPRESSION: Negative. Electronically Signed   By: Rolm Baptise M.D.   On: 08/21/2021 10:52     Assessment & Plan: Patricia [name] is a 46 y.o.  female with past medical history including CHF, GERD, hypertension, dyslipidemia, gout, and end-stage renal disease on hemodialysis, who was admitted to [facility] on 08/21/2021 for ESRD on hemodialysis ([place]) [N18.6, Z99.2] Chest pain [R07.9] Chest pain with moderate risk for cardiac etiology [R07.9]  Hyperkalemia with end-stage renal disease on hemodialysis.  Last treatment completed yesterday at outpatient clinic.  Treatment terminated after 3 hours due to chest pain and tightness.  This is not uncommon when removing large volumes of fluid in the outpatient setting.  Potassium 5.4 today.  Will order Lokelma 10 g once.  Next dialysis treatment scheduled for Saturday.  2. Anemia of chronic kidney disease Lab Results  Component Value Date   HGB 9.8 (L) 08/22/2021    Hemoglobin within acceptable range.  We will continue to monitor  3. Secondary Hyperparathyroidism: with outpatient labs: PTH 829, phosphorus 5.2, calcium 8.6 on 08/12/21.   Lab Results  Component Value Date   PTH 160 (H) 04/06/2016   CALCIUM 8.1 (L) 08/22/2021   CAION 1.13 (L) 04/14/2021   PHOS 4.1 03/17/2020    4.  Atypical chest pain experienced during dialysis.  Troponins negative.  EKG nonacute.  Cardiology consulted.  LOS: 0   8/18/20232:26 PM

## 2021-08-22 NOTE — Discharge Summary (Signed)
Physician Discharge Summary   Patient: Patricia [name] MRN: [id number] DOB: [birth year]  Admit date:     08/21/2021  Discharge date: {dischdate:26783}  Discharge Physician: Annita Brod   PCP: Doyle Askew, PA-C   Recommendations at discharge:  {Tip this will not be part of the note when signed- Example include specific recommendations for outpatient follow-up, pending tests to follow-up on. (Optional):26781}  ***  Discharge Diagnoses: Active Problems:   Chest pain   ESRD on dialysis [facility])   Type 2 diabetes mellitus with other diabetic kidney complication (HCC)   Gout   GERD without esophagitis   Anxiety and depression  Resolved Problems:   * No resolved hospital problems. *  Hospital Course: 46 year old female with past medical history of hypertension, morbid obesity, diastolic heart failure and end-stage renal disease on hemodialysis who presented to the emergency department on 8/17 with 7/10 chest heaviness.  EKG noted T wave inversions in lateral and V1 leads.  Patient brought in for further evaluation with cardiology and nephrology consulted.  Assessment and Plan: Chest pain - The patient will be admitted to a cardiac telemetry  observation bed. - We will follow her troponins. - We will place her on as needed sublingual nitroglycerin and IV morphine sulfate for pain in addition to aspirin. - We will continue beta-blocker therapy with Toprol-XL. - Cardiology consult will be obtained. - I notified Dr. Saralyn Pilar about the patient.   ESRD on dialysis [facility]) - We will obtain a follow-up nephrology consultation. - I notified Dr. Holley Raring about the patient.  Anxiety and depression - We will continue Xanax, Cymbalta and Trintellix  GERD without esophagitis - We will continue PPI therapy.  Gout - We will continue allopurinol.  Type 2 diabetes mellitus with other diabetic kidney complication (HCC) - The patient will be admitted placed on treatment  coverage with NovoLog.      {Tip this will not be part of the note when signed Body mass index is 38.33 kg/m. , ,  (Optional):26781}  {(NOTE) Pain control PDMP Statment (Optional):26782} Consultants: *** Procedures performed: ***  Disposition: {Plan; Disposition:26390} Diet recommendation:  Discharge Diet Orders (From admission, onward)     Start     Ordered   08/22/21 0000  Diet - low sodium heart healthy        08/22/21 1621           {Diet_Plan:26776} DISCHARGE MEDICATION: Allergies as of 08/22/2021       Reactions   Nsaids Other (See Comments)   Cannot take due to Kidney disease/Kidney function   Tolmetin Other (See Comments)   Cannot take due to Kidney Disease        Medication List     TAKE these medications    acetaminophen 500 MG tablet Commonly known as: TYLENOL Take 1,000 mg by mouth every 6 (six) hours as needed for moderate pain.   allopurinol 100 MG tablet Commonly known as: ZYLOPRIM Take 100 mg by mouth at bedtime.   ALPRAZolam 1 MG tablet Commonly known as: XANAX Take 1 mg by mouth 2 (two) times daily as needed for anxiety or sleep.   carbidopa-levodopa 25-100 MG tablet Commonly known as: SINEMET IR TAKE 1 TABLET BY MOUTH THREE TIMES DAILY   Diclofenac Sodium 1 % Crea 1 gram qid prn   DULoxetine 30 MG capsule Commonly known as: Cymbalta Take 1 capsule (30 mg total) by mouth daily.   ezetimibe 10 MG tablet Commonly known as: ZETIA Take 10 mg by  mouth daily.   gabapentin 100 MG capsule Commonly known as: Neurontin Take 3 capsules (300 mg total) by mouth 3 (three) times daily.   hydrOXYzine 25 MG tablet Commonly known as: ATARAX Take 25 mg by mouth every 6 (six) hours as needed for itching.   lanthanum 1000 MG chewable tablet Commonly known as: FOSRENOL Chew 1,000-3,000 mg by mouth See admin instructions. Take 3000 mg with each meal and 1000-2000 mg with each snack   lidocaine-prilocaine cream Commonly known as: EMLA 1gram  tid prn   metoprolol succinate 50 MG 24 hr tablet Commonly known as: TOPROL-XL Take 50 mg by mouth every evening. Take with or immediately following a meal.   multivitamin Tabs tablet Take 1 tablet by mouth at bedtime.   omeprazole 40 MG capsule Commonly known as: PRILOSEC Take 40 mg by mouth in the morning and at bedtime.   ondansetron 4 MG disintegrating tablet Commonly known as: ZOFRAN-ODT Take 4 mg by mouth 2 (two) times daily as needed for nausea/vomiting.   traMADol 50 MG tablet Commonly known as: ULTRAM Take 1 tablet (50 mg total) by mouth every 6 (six) hours as needed for moderate pain.   vortioxetine HBr 5 MG Tabs tablet Commonly known as: TRINTELLIX Take 5 mg by mouth daily.   Xeomin 50 units Solr injection Generic drug: incobotulinumtoxinA Inject 50 Units into the muscle every 3 (three) months.   zolpidem 5 MG tablet Commonly known as: AMBIEN Take 5 mg by mouth at bedtime.        Discharge Exam: Filed Weights   08/21/21 0941  Weight: 111 kg   ***  Condition at discharge: {DC Condition:26389}  The results of significant diagnostics from this hospitalization (including imaging, microbiology, ancillary and laboratory) are listed below for reference.   Imaging Studies: DG Chest 2 View  Result Date: 08/21/2021 CLINICAL DATA:  Chest pain EXAM: CHEST - 2 VIEW COMPARISON:  04/26/2021 FINDINGS: The heart size and mediastinal contours are within normal limits. Both lungs are clear. The visualized skeletal structures are unremarkable. IMPRESSION: Negative. Electronically Signed   By: Rolm Baptise M.D.   On: 08/21/2021 10:52    Microbiology: Results for orders placed or performed during the hospital encounter of 05/15/20  SARS CORONAVIRUS 2 (TAT 6-24 HRS) Nasopharyngeal Nasopharyngeal Swab     Status: None   Collection Time: 05/15/20 10:36 AM   Specimen: Nasopharyngeal Swab  Result Value Ref Range Status   SARS Coronavirus 2 NEGATIVE NEGATIVE Final     Comment: (NOTE) SARS-CoV-2 target nucleic acids are NOT DETECTED.  The SARS-CoV-2 RNA is generally detectable in upper and lower respiratory specimens during the acute phase of infection. Negative results do not preclude SARS-CoV-2 infection, do not rule out co-infections with other pathogens, and should not be used as the sole basis for treatment or other patient management decisions. Negative results must be combined with clinical observations, patient history, and epidemiological information. The expected result is Negative.  Fact Sheet for Patients: SugarRoll.be  Fact Sheet for Healthcare Providers: https://www.woods-mathews.com/  This test is not yet approved or cleared by the [place] FDA and  has been authorized for detection and/or diagnosis of SARS-CoV-2 by FDA under an Emergency Use Authorization (EUA). This EUA will remain  in effect (meaning this test can be used) for the duration of the COVID-19 declaration under Se ction 564(b)(1) of the Act, 21 U.S.C. section 360bbb-3(b)(1), unless the authorization is terminated or revoked sooner.  Performed at [facility], [place]  [address], , [place] [address]     Labs: CBC: Recent Labs  Lab 08/21/21 1003 08/22/21 0548  WBC 6.0 6.3  HGB 11.4* 9.8*  HCT 37.8 32.4*  MCV 83.3 84.2  PLT 246 125   Basic Metabolic Panel: Recent Labs  Lab 08/21/21 1003 08/22/21 0548  NA 139 137  K 4.7 5.4*  CL 96* 99  CO2 29 26  GLUCOSE 90 110*  BUN 38* 57*  CREATININE 9.06* 11.02*  CALCIUM 8.7* 8.1*   Liver Function Tests: Recent Labs  Lab 08/21/21 1003  AST 21  ALT 9  ALKPHOS 113  BILITOT 0.7  PROT 8.6*  ALBUMIN 4.1   CBG: No results for input(s): "GLUCAP" in the last 168 hours.  Discharge time spent: {LESS THAN/GREATER OKPW:34688} 30 minutes.  Signed: Annita Brod, MD [facility] 08/22/2021

## 2021-08-22 NOTE — ED Notes (Signed)
The pt was resting comfortably on her right side. Attempted to wake the pt twice with no success. Breakfast tray at bedside.

## 2021-08-22 NOTE — ED Notes (Signed)
Informed RN bed assigned 

## 2021-08-22 NOTE — Care Management Obs Status (Signed)
[facility] NOTIFICATION   Patient Details  Name: Patricia [name] MRN: [id number] Date of Birth: October 06, 1975   Medicare Observation Status Notification Given:  Yes    Candie Chroman, LCSW 08/22/2021, 4:09 PM

## 2021-08-23 LAB — HEPATITIS B SURFACE ANTIBODY, QUANTITATIVE: Hep B S AB Quant (Post): >1000 m[IU]/mL (ref >9.9)

## 2021-08-23 NOTE — Assessment & Plan Note (Signed)
Met criteria with BMI greater than 35 and additional comorbidity of hypertension

## 2021-08-25 LAB — HEPATITIS B E ANTIBODY: Hep B E Ab: NEGATIVE

## 2021-09-07 DIAGNOSIS — T782XXA Anaphylactic shock, unspecified, initial encounter: Secondary | ICD-10-CM | POA: Insufficient documentation

## 2021-10-03 ENCOUNTER — Other Ambulatory Visit: Payer: Self-pay | Admitting: Neurology

## 2021-10-16 ENCOUNTER — Emergency Department (HOSPITAL_COMMUNITY)
Admission: EM | Admit: 2021-10-16 | Discharge: 2021-10-16 | Disposition: A | Discharge status: Home / Self Care | Payer: Medicare Other | Attending: Emergency Medicine | Admitting: Emergency Medicine

## 2021-10-16 ENCOUNTER — Emergency Department (HOSPITAL_COMMUNITY): Payer: Medicare Other

## 2021-10-16 DIAGNOSIS — R0602 Shortness of breath: Secondary | ICD-10-CM | POA: Insufficient documentation

## 2021-10-16 DIAGNOSIS — Z5321 Procedure and treatment not carried out due to patient leaving prior to being seen by health care provider: Secondary | ICD-10-CM | POA: Diagnosis not present

## 2021-10-16 DIAGNOSIS — Z992 Dependence on renal dialysis: Secondary | ICD-10-CM | POA: Insufficient documentation

## 2021-10-16 DIAGNOSIS — R079 Chest pain, unspecified: Secondary | ICD-10-CM | POA: Insufficient documentation

## 2021-10-16 LAB — TROPONIN I (HIGH SENSITIVITY): Troponin I (High Sensitivity): 6 ng/L (ref <18)

## 2021-10-16 LAB — CBC WITH DIFFERENTIAL/PLATELET
Abs Immature Granulocytes: 0.03 10*3/uL (ref 0.00–0.07)
Basophils Absolute: 0 10*3/uL (ref 0.0–0.1)
Basophils Relative: 1 %
Eosinophils Absolute: 0.1 10*3/uL (ref 0.0–0.5)
Eosinophils Relative: 2 %
HCT: 38.7 % (ref 36.0–46.0)
Hemoglobin: 11.7 g/dL — ABNORMAL LOW (ref 12.0–15.0)
Immature Granulocytes: 1 %
Lymphocytes Relative: 28 %
Lymphs Abs: 1.5 10*3/uL (ref 0.7–4.0)
MCH: 26.1 pg (ref 26.0–34.0)
MCHC: 30.2 g/dL (ref 30.0–36.0)
MCV: 86.2 fL (ref 80.0–100.0)
Monocytes Absolute: 0.5 10*3/uL (ref 0.1–1.0)
Monocytes Relative: 9 %
Neutro Abs: 3.4 10*3/uL (ref 1.7–7.7)
Neutrophils Relative %: 59 %
Platelets: 278 10*3/uL (ref 150–400)
RBC: 4.49 MIL/uL (ref 3.87–5.11)
RDW: 18.9 % — ABNORMAL HIGH (ref 11.5–15.5)
WBC: 5.6 10*3/uL (ref 4.0–10.5)
nRBC: 3.7 % — ABNORMAL HIGH (ref 0.0–0.2)

## 2021-10-16 LAB — BASIC METABOLIC PANEL
Anion gap: 16 — ABNORMAL HIGH (ref 5–15)
BUN: 24 mg/dL — ABNORMAL HIGH (ref 6–20)
CO2: 26 mmol/L (ref 22–32)
Calcium: 8.4 mg/dL — ABNORMAL LOW (ref 8.9–10.3)
Chloride: 94 mmol/L — ABNORMAL LOW (ref 98–111)
Creatinine, Ser: 7.35 mg/dL — ABNORMAL HIGH (ref 0.44–1.00)
GFR, Estimated: 6 mL/min — ABNORMAL LOW (ref >60)
Glucose, Bld: 90 mg/dL (ref 70–99)
Potassium: 4.4 mmol/L (ref 3.5–5.1)
Sodium: 136 mmol/L (ref 135–145)

## 2021-10-16 LAB — I-STAT BETA HCG BLOOD, ED (MC, WL, AP ONLY): I-stat hCG, quantitative: 9.2 m[IU]/mL — ABNORMAL HIGH (ref <5)

## 2021-10-16 NOTE — ED Provider Triage Note (Signed)
Emergency Medicine Provider Triage Evaluation Note  Patricia [name] , a 46 y.o. female  was evaluated in triage.  Pt complains of pain and shortness of breath.  Patient states she started to have chest pain during dialysis this morning.  The pain is sharp, located on her left chest radiating to her left arm and back.  She never had this similar chest pain before.  She does have numbness on her left arm.  She also had shortness of breath, nausea, vomiting.  Review of Systems  Positive: See above Negative: See above  Physical Exam  Ht $R'5\' 7"'MI$  (1.702 m)   Wt 111.1 kg   BMI 38.37 kg/m  Gen:   Awake, no distress   Resp:  Normal effort, wearing oxygen. MSK:   Moves extremities without difficulty, decreased sensation and strength on left arm. Other:    Medical Decision Making  Medically screening exam initiated at 11:26 AM.  Appropriate orders placed.  Patricia [name] was informed that the remainder of the evaluation will be completed by another provider, this initial triage assessment does not replace that evaluation, and the importance of remaining in the ED until their evaluation is complete.  Work-up initiated   Rex Kras, [place] 10/16/21 1134

## 2021-10-16 NOTE — ED Triage Notes (Signed)
EMS stated, picked up the pt from dialysis and started chest pain and radiating to her arm and back. She does have some SOB.

## 2021-10-16 NOTE — ED Triage Notes (Signed)
EMS given ASA 324 mg and one Nitro

## 2021-10-16 NOTE — ED Notes (Signed)
Pt no longer wanted to wait pt left hospital.

## 2021-10-16 NOTE — ED Triage Notes (Signed)
Pt. Stated, I was sitting talking to my daughter and I started having chest pain and it was the worse Chest pain I ever had, it just felt different from the rest. I also got SOB . No SOB now and the pain not as bad.

## 2021-10-18 LAB — PATHOLOGIST SMEAR REVIEW: Path Review: NEGATIVE

## 2022-01-10 ENCOUNTER — Other Ambulatory Visit: Payer: Self-pay

## 2022-01-10 ENCOUNTER — Emergency Department
Admission: EM | Admit: 2022-01-10 | Discharge: 2022-01-10 | Disposition: A | Discharge status: Home / Self Care | Payer: Medicare Other | Attending: Student in an Organized Health Care Education/Training Program | Admitting: Student in an Organized Health Care Education/Training Program

## 2022-01-10 ENCOUNTER — Emergency Department: Payer: Medicare Other

## 2022-01-10 DIAGNOSIS — Z992 Dependence on renal dialysis: Secondary | ICD-10-CM | POA: Diagnosis not present

## 2022-01-10 DIAGNOSIS — N186 End stage renal disease: Secondary | ICD-10-CM | POA: Insufficient documentation

## 2022-01-10 DIAGNOSIS — R0789 Other chest pain: Secondary | ICD-10-CM | POA: Diagnosis present

## 2022-01-10 LAB — BASIC METABOLIC PANEL
Anion gap: 17 — ABNORMAL HIGH (ref 5–15)
BUN: 40 mg/dL — ABNORMAL HIGH (ref 6–20)
CO2: 25 mmol/L (ref 22–32)
Calcium: 8.6 mg/dL — ABNORMAL LOW (ref 8.9–10.3)
Chloride: 94 mmol/L — ABNORMAL LOW (ref 98–111)
Creatinine, Ser: 7.72 mg/dL — ABNORMAL HIGH (ref 0.44–1.00)
GFR, Estimated: 6 mL/min — ABNORMAL LOW (ref >60)
Glucose, Bld: 115 mg/dL — ABNORMAL HIGH (ref 70–99)
Potassium: 4.5 mmol/L (ref 3.5–5.1)
Sodium: 136 mmol/L (ref 135–145)

## 2022-01-10 LAB — CBC
HCT: 37 % (ref 36.0–46.0)
Hemoglobin: 11.7 g/dL — ABNORMAL LOW (ref 12.0–15.0)
MCH: 25.9 pg — ABNORMAL LOW (ref 26.0–34.0)
MCHC: 31.6 g/dL (ref 30.0–36.0)
MCV: 82 fL (ref 80.0–100.0)
Platelets: 226 10*3/uL (ref 150–400)
RBC: 4.51 MIL/uL (ref 3.87–5.11)
RDW: 16.9 % — ABNORMAL HIGH (ref 11.5–15.5)
WBC: 7.5 10*3/uL (ref 4.0–10.5)
nRBC: 0 % (ref 0.0–0.2)

## 2022-01-10 LAB — TROPONIN I (HIGH SENSITIVITY)
Troponin I (High Sensitivity): 6 ng/L (ref <18)
Troponin I (High Sensitivity): 7 ng/L (ref <18)

## 2022-01-10 MED ORDER — OXYCODONE-ACETAMINOPHEN 5-325 MG PO TABS
1.0000 | ORAL_TABLET | Freq: Once | ORAL | Status: AC
  Administered 2022-01-10: 1 via ORAL
  Filled 2022-01-10: qty 1

## 2022-01-10 NOTE — ED Notes (Addendum)
Pt came to [place] from via EMS from outpatient dialysis and had a dialysis needle left in her arm. Called inpatient dialysis and talked to [facility] to see if someone could take it out and she said "that has not been one of our patients that was outpatient we didn't leave a needle in her arm and hung up the phone."

## 2022-01-10 NOTE — ED Triage Notes (Signed)
First Nurse Note:  Pt via EMS from Dialysis. Pt was getting dialysis, and became hypotensive initially. Dialysis given 134ml of fluid. States that she then started having chest pain. EMS gave 324 ASA. EKG was unremarkable. Pt is A&Ox4 and NAD. Access in the R arm.   74/45 BP, BP came up to 108/70.  Last BP 137/58  99% on RA 76 HR  146 CBG  98.2 oral

## 2022-01-10 NOTE — ED Provider Notes (Signed)
[facility] Provider Note    Event Date/Time   First MD Initiated Contact with Patient 01/10/22 1214     (approximate)   History   Chest Pain   HPI  Patricia [name] is a 47 y.o. female history of ESRD on dialysis presents from dialysis with episode of chest pain and left arm pain that occurred 20 minutes prior to the end of her session.  States that this morning she was feeling well no discomfort.  States that they are pulling some extra fluid off today states that her blood pressure did drop during dialysis.  She currently feels improved after receiving 100 cc bolus via EMS.  She denies any discomfort or chest pain currently.  No nausea or vomiting.     Physical Exam   Triage Vital Signs: ED Triage Vitals  Enc Vitals Group     BP 01/10/22 1124 107/72     Pulse Rate 01/10/22 1123 79     Resp 01/10/22 1123 18     Temp 01/10/22 1123 98 F (36.7 C)     Temp Source 01/10/22 1123 Oral     SpO2 01/10/22 1123 97 %     Weight --      Height --      Head Circumference --      Peak Flow --      Pain Score 01/10/22 1123 7     Pain Loc --      Pain Edu? --      Excl. in [place]? --     Most recent vital signs: Vitals:   01/10/22 1123 01/10/22 1124  BP:  107/72  Pulse: 79   Resp: 18   Temp: 98 F (36.7 C)   SpO2: 97%      Constitutional: Alert  Eyes: Conjunctivae are normal.  Head: Atraumatic. Nose: No congestion/rhinnorhea. Mouth/Throat: Mucous membranes are moist.   Neck: Painless ROM.  Cardiovascular:   Good peripheral circulation. Respiratory: Normal respiratory effort.  No retractions.  Gastrointestinal: Soft and nontender.  Musculoskeletal:  no deformity Neurologic:  MAE spontaneously. No gross focal neurologic deficits are appreciated.  Skin:  Skin is warm, dry and intact. No rash noted. Psychiatric: Mood and affect are normal. Speech and behavior are normal.    ED Results / Procedures / Treatments   Labs (all labs  ordered are listed, but only abnormal results are displayed) Labs Reviewed  BASIC METABOLIC PANEL - Abnormal; Notable for the following components:      Result Value   Chloride 94 (*)    Glucose, Bld 115 (*)    BUN 40 (*)    Creatinine, Ser 7.72 (*)    Calcium 8.6 (*)    GFR, Estimated 6 (*)    Anion gap 17 (*)    All other components within normal limits  CBC - Abnormal; Notable for the following components:   Hemoglobin 11.7 (*)    MCH 25.9 (*)    RDW 16.9 (*)    All other components within normal limits  POC URINE PREG, ED  TROPONIN I (HIGH SENSITIVITY)  TROPONIN I (HIGH SENSITIVITY)     EKG  ED ECG REPORT I, Willy Eddy, the attending physician, personally viewed and interpreted this ECG.   Date: 01/10/2022  EKG Time: 11:34  Rate: 80  Rhythm: sinus  Axis: normal  Intervals: normal  ST&T Change: no stemi, no depressions    RADIOLOGY Please see ED Course for my review and interpretation.  I  personally reviewed all radiographic images ordered to evaluate for the above acute complaints and reviewed radiology reports and findings.  These findings were personally discussed with the patient.  Please see medical record for radiology report.    PROCEDURES:  Critical Care performed: no  Procedures   MEDICATIONS ORDERED IN ED: Medications  oxyCODONE-acetaminophen (PERCOCET/ROXICET) 5-325 MG per tablet 1 tablet (1 tablet Oral Given 01/10/22 1336)     IMPRESSION / MDM / ASSESSMENT AND PLAN / ED COURSE  I reviewed the triage vital signs and the nursing notes.                              Differential diagnosis includes, but is not limited to, ACS, pericarditis, esophagitis, boerhaaves, pe, dissection, pna, bronchitis, costochondritis  Patient presenting to the ER for evaluation of symptoms as described above.  Based on symptoms, risk factors and considered above differential, this presenting complaint could reflect a potentially life-threatening illness  therefore the patient will be placed on continuous pulse oximetry and telemetry for monitoring.  Laboratory evaluation will be sent to evaluate for the above complaints.  Patient currently well-appearing hemodynamically stable afebrile no hypoxia normotensive.  EKG nonischemic.  Initial troponin negative.  Exam is reassuring.  Chest x-ray on my review and interpretation does not show any evidence of infiltrate or consolidation.  Will observe monitor for repeat troponin.  Patient had similar presentation admission to the hospital for formal chest pain rule out and was subsequently discharged without any findings.    FINAL CLINICAL IMPRESSION(S) / ED DIAGNOSES   Final diagnoses:  Atypical chest pain     Rx / DC Orders   ED Discharge Orders     None        Note:  This document was prepared using Dragon voice recognition software and may include unintentional dictation errors.    Merlyn Lot, MD 01/10/22 1432

## 2022-01-25 ENCOUNTER — Emergency Department
Admission: EM | Admit: 2022-01-25 | Discharge: 2022-01-25 | Disposition: A | Discharge status: Home / Self Care | Payer: Medicare Other | Attending: Emergency Medicine | Admitting: Emergency Medicine

## 2022-01-25 ENCOUNTER — Emergency Department: Payer: Medicare Other

## 2022-01-25 ENCOUNTER — Telehealth: Payer: Self-pay | Admitting: Urgent Care

## 2022-01-25 ENCOUNTER — Ambulatory Visit
Admission: EM | Admit: 2022-01-25 | Discharge: 2022-01-25 | Disposition: A | Discharge status: Other Acute Inpatient Hospital | Payer: Medicare Other

## 2022-01-25 ENCOUNTER — Other Ambulatory Visit: Payer: Self-pay

## 2022-01-25 DIAGNOSIS — W1830XA Fall on same level, unspecified, initial encounter: Secondary | ICD-10-CM | POA: Diagnosis not present

## 2022-01-25 DIAGNOSIS — I509 Heart failure, unspecified: Secondary | ICD-10-CM | POA: Diagnosis not present

## 2022-01-25 DIAGNOSIS — S8992XA Unspecified injury of left lower leg, initial encounter: Secondary | ICD-10-CM | POA: Insufficient documentation

## 2022-01-25 DIAGNOSIS — S0990XA Unspecified injury of head, initial encounter: Secondary | ICD-10-CM | POA: Insufficient documentation

## 2022-01-25 DIAGNOSIS — Y92015 Private garage of single-family (private) house as the place of occurrence of the external cause: Secondary | ICD-10-CM | POA: Diagnosis not present

## 2022-01-25 DIAGNOSIS — W19XXXA Unspecified fall, initial encounter: Secondary | ICD-10-CM

## 2022-01-25 DIAGNOSIS — N186 End stage renal disease: Secondary | ICD-10-CM | POA: Diagnosis not present

## 2022-01-25 DIAGNOSIS — M25562 Pain in left knee: Secondary | ICD-10-CM

## 2022-01-25 DIAGNOSIS — I132 Hypertensive heart and chronic kidney disease with heart failure and with stage 5 chronic kidney disease, or end stage renal disease: Secondary | ICD-10-CM | POA: Insufficient documentation

## 2022-01-25 LAB — BASIC METABOLIC PANEL
Anion gap: 14 (ref 5–15)
BUN: 59 mg/dL — ABNORMAL HIGH (ref 6–20)
CO2: 26 mmol/L (ref 22–32)
Calcium: 8.4 mg/dL — ABNORMAL LOW (ref 8.9–10.3)
Chloride: 95 mmol/L — ABNORMAL LOW (ref 98–111)
Creatinine, Ser: 10.59 mg/dL — ABNORMAL HIGH (ref 0.44–1.00)
GFR, Estimated: 4 mL/min — ABNORMAL LOW (ref >60)
Glucose, Bld: 118 mg/dL — ABNORMAL HIGH (ref 70–99)
Potassium: 5.3 mmol/L — ABNORMAL HIGH (ref 3.5–5.1)
Sodium: 135 mmol/L (ref 135–145)

## 2022-01-25 LAB — CBC
HCT: 38.7 % (ref 36.0–46.0)
Hemoglobin: 11.6 g/dL — ABNORMAL LOW (ref 12.0–15.0)
MCH: 25.4 pg — ABNORMAL LOW (ref 26.0–34.0)
MCHC: 30 g/dL (ref 30.0–36.0)
MCV: 84.9 fL (ref 80.0–100.0)
Platelets: 225 10*3/uL (ref 150–400)
RBC: 4.56 MIL/uL (ref 3.87–5.11)
RDW: 17.6 % — ABNORMAL HIGH (ref 11.5–15.5)
WBC: 6.4 10*3/uL (ref 4.0–10.5)
nRBC: 0 % (ref 0.0–0.2)

## 2022-01-25 LAB — TROPONIN I (HIGH SENSITIVITY): Troponin I (High Sensitivity): 5 ng/L (ref <18)

## 2022-01-25 MED ORDER — OXYCODONE-ACETAMINOPHEN 5-325 MG PO TABS: 1.0000 | ORAL_TABLET | Freq: Four times a day (QID) | ORAL | 0 refills | Status: AC | PRN

## 2022-01-25 MED ORDER — OXYCODONE-ACETAMINOPHEN 5-325 MG PO TABS
1.0000 | ORAL_TABLET | Freq: Once | ORAL | Status: AC
  Administered 2022-01-25: 1 via ORAL
  Filled 2022-01-25: qty 1

## 2022-01-25 MED ORDER — ONDANSETRON 4 MG PO TBDP
4.0000 mg | ORAL_TABLET | Freq: Once | ORAL | Status: AC
  Administered 2022-01-25: 4 mg via ORAL
  Filled 2022-01-25: qty 1

## 2022-01-25 MED ORDER — ONDANSETRON 4 MG PO TBDP: 4.0000 mg | ORAL_TABLET | Freq: Three times a day (TID) | ORAL | 0 refills | Status: AC | PRN

## 2022-01-25 NOTE — Discharge Instructions (Signed)
Please keep your dialysis appointment. You have been prescribed a short course of Percocet for pain. Return to the emergency department with new or worsening symptoms.

## 2022-01-25 NOTE — ED Provider Notes (Signed)
[facility] Provider Note  Patient Contact: 7:04 PM (approximate)   History   Fall, Loss of Consciousness, and Knee Injury   HPI  Patricia [name] is a 47 y.o. female with a history of end-stage renal disease, hypertension, hypercholesterol anemia, gout and CHF, presents to the emergency department with knee pain.  Patient states that 1 day ago, she had return from dialysis.  Patient states that she sometimes passes out after dialysis and passed out in her garage and fell onto a concrete surface.  Patient unsure about loss of consciousness.  No nausea or vomiting.  Patient has next dialysis appointment on Tuesday.      Physical Exam   Triage Vital Signs: ED Triage Vitals  Enc Vitals Group     BP 01/25/22 1621 100/74     Pulse Rate 01/25/22 1621 81     Resp 01/25/22 1621 20     Temp 01/25/22 1621 99 F (37.2 C)     Temp Source 01/25/22 1621 Oral     SpO2 01/25/22 1621 94 %     Weight 01/25/22 1623 244 lb (110.7 kg)     Height 01/25/22 1623 $RemoveBefor'5\' 7"'gHYriayftQEc$  (1.702 m)     Head Circumference --      Peak Flow --      Pain Score 01/25/22 1622 8     Pain Loc --      Pain Edu? --      Excl. in [place]? --     Most recent vital signs: Vitals:   01/25/22 1621  BP: 100/74  Pulse: 81  Resp: 20  Temp: 99 F (37.2 C)  SpO2: 94%     General: Alert and in no acute distress. Eyes:  PERRL. EOMI. Head: No acute traumatic findings ENT:      Nose: No congestion/rhinnorhea.      Mouth/Throat: Mucous membranes are moist. Neck: No stridor. No cervical spine tenderness to palpation. Cardiovascular:  Good peripheral perfusion Respiratory: Normal respiratory effort without tachypnea or retractions. Lungs CTAB. Good air entry to the bases with no decreased or absent breath sounds. Gastrointestinal: Bowel sounds 4 quadrants. Soft and nontender to palpation. No guarding or rigidity. No palpable masses. No distention. No CVA tenderness. Musculoskeletal: Full range  of motion to all extremities.  Neurologic:  No gross focal neurologic deficits are appreciated.  Skin:   No rash noted    ED Results / Procedures / Treatments   Labs (all labs ordered are listed, but only abnormal results are displayed) Labs Reviewed  BASIC METABOLIC PANEL - Abnormal; Notable for the following components:      Result Value   Potassium 5.3 (*)    Chloride 95 (*)    Glucose, Bld 118 (*)    BUN 59 (*)    Creatinine, Ser 10.59 (*)    Calcium 8.4 (*)    GFR, Estimated 4 (*)    All other components within normal limits  CBC - Abnormal; Notable for the following components:   Hemoglobin 11.6 (*)    MCH 25.4 (*)    RDW 17.6 (*)    All other components within normal limits  TROPONIN I (HIGH SENSITIVITY)  TROPONIN I (HIGH SENSITIVITY)        RADIOLOGY  I personally viewed and evaluated these images as part of my medical decision making, as well as reviewing the written report by the radiologist.  ED Provider Interpretation: No acute intracranial abnormality on head and cervical spine CTs.  X-ray of the left knee shows no acute abnormality.   PROCEDURES:  Critical Care performed: No  Procedures   MEDICATIONS ORDERED IN ED: Medications  oxyCODONE-acetaminophen (PERCOCET/ROXICET) 5-325 MG per tablet 1 tablet (1 tablet Oral Given 01/25/22 1734)  ondansetron (ZOFRAN-ODT) disintegrating tablet 4 mg (4 mg Oral Given 01/25/22 1734)     IMPRESSION / MDM / ASSESSMENT AND PLAN / ED COURSE  I reviewed the triage vital signs and the nursing notes.                              Assessment plan Fall 47 year old female presents to the emergency department after patient had a mechanical fall 1 day ago.  BMP indicates slightly elevated potassium at 5.3.  Troponin within range.  EKG indicates normal sinus rhythm without ST segment elevation or other apparent arrhythmia.  CTs of the head and cervical spine unremarkable.  X-ray of the left knee shows no acute bony  abnormality.  Patient given a Percocet in the emergency department for pain and discharged with a short course of Percocet.  Return precautions were given to return to the emergency department with new or worsening symptoms.      FINAL CLINICAL IMPRESSION(S) / ED DIAGNOSES   Final diagnoses:  Fall, initial encounter  Acute pain of left knee     Rx / DC Orders   ED Discharge Orders          Ordered    oxyCODONE-acetaminophen (PERCOCET/ROXICET) 5-325 MG tablet  Every 6 hours PRN        01/25/22 1902    ondansetron (ZOFRAN-ODT) 4 MG disintegrating tablet  Every 8 hours PRN        01/25/22 1902             Note:  This document was prepared using Dragon voice recognition software and may include unintentional dictation errors.   Vallarie Mare [place], Hershal Coria 01/25/22 Pauline Aus    Lavonia Drafts, MD 01/25/22 1929

## 2022-01-25 NOTE — ED Notes (Signed)
This RN unable to get labs drawn, pt is difficult stick, lab called to collect labs

## 2022-01-25 NOTE — Telephone Encounter (Signed)
Patient presented for walk-in evaluation for injury which she sustained when she passed out after returning from dialysis treatment today.  Discussed patient's concerns with her which included a possible injury to her left extremity.  She also states she thinks she hit her head.  She does not know what caused her to pass out.  Informed patient that she would need to be evaluated in ED given the mechanism of her injury.  I expressed my concern for the reason that she suffered the syncopal event in the first place given her dialysis treatment today.  Possible hypovolemia versus electrolyte imbalance which cannot be evaluated in a timely manner in this urgent care clinic as it might be in another.  I informed her that she would need blood work with quick turnaround of results.  Also expressed my concern for possible head injury since she said she hit her head, not to mention the injury to her extremity which would require imaging and possible need for splinting which cannot be performed at this clinic.  Patient acknowledged understanding my concerns and with reluctance stated she would go to the ED.  Patient was accompanied by driver/caregiver/guardian.

## 2022-01-25 NOTE — ED Triage Notes (Signed)
Pt to ED with daughter from home, pt had dialysis yesterday and when got home got out of car fast and fell and hit cement floor of garage. Pt fell backwards and hit her head. Pt did lose consciousness. They went to UC but were told come to ER for head CT. Pt also complains of L knee and hip pain after fall.

## 2022-01-29 ENCOUNTER — Other Ambulatory Visit: Payer: Self-pay | Admitting: Nurse Practitioner

## 2022-01-29 DIAGNOSIS — N186 End stage renal disease: Secondary | ICD-10-CM

## 2022-01-29 DIAGNOSIS — R1011 Right upper quadrant pain: Secondary | ICD-10-CM

## 2022-01-29 DIAGNOSIS — R112 Nausea with vomiting, unspecified: Secondary | ICD-10-CM

## 2022-02-09 ENCOUNTER — Ambulatory Visit: Payer: Medicare Other

## 2022-02-11 ENCOUNTER — Emergency Department (HOSPITAL_COMMUNITY)
Admission: EM | Admit: 2022-02-11 | Discharge: 2022-02-12 | Disposition: A | Discharge status: Home / Self Care | Payer: Medicare Other | Attending: Emergency Medicine | Admitting: Emergency Medicine

## 2022-02-11 ENCOUNTER — Other Ambulatory Visit: Payer: Self-pay

## 2022-02-11 ENCOUNTER — Encounter: Payer: Self-pay | Admitting: Neurology

## 2022-02-11 ENCOUNTER — Emergency Department (HOSPITAL_COMMUNITY): Payer: Medicare Other

## 2022-02-11 DIAGNOSIS — D631 Anemia in chronic kidney disease: Secondary | ICD-10-CM | POA: Insufficient documentation

## 2022-02-11 DIAGNOSIS — R7989 Other specified abnormal findings of blood chemistry: Secondary | ICD-10-CM | POA: Insufficient documentation

## 2022-02-11 DIAGNOSIS — R531 Weakness: Secondary | ICD-10-CM | POA: Diagnosis present

## 2022-02-11 DIAGNOSIS — I120 Hypertensive chronic kidney disease with stage 5 chronic kidney disease or end stage renal disease: Secondary | ICD-10-CM | POA: Diagnosis not present

## 2022-02-11 DIAGNOSIS — Z79899 Other long term (current) drug therapy: Secondary | ICD-10-CM | POA: Diagnosis not present

## 2022-02-11 DIAGNOSIS — N189 Chronic kidney disease, unspecified: Secondary | ICD-10-CM

## 2022-02-11 DIAGNOSIS — Z992 Dependence on renal dialysis: Secondary | ICD-10-CM | POA: Insufficient documentation

## 2022-02-11 DIAGNOSIS — N186 End stage renal disease: Secondary | ICD-10-CM | POA: Diagnosis not present

## 2022-02-11 DIAGNOSIS — G259 Extrapyramidal and movement disorder, unspecified: Secondary | ICD-10-CM | POA: Insufficient documentation

## 2022-02-11 DIAGNOSIS — I10 Essential (primary) hypertension: Secondary | ICD-10-CM

## 2022-02-11 LAB — CBC
HCT: 33.1 % — ABNORMAL LOW (ref 36.0–46.0)
Hemoglobin: 10.5 g/dL — ABNORMAL LOW (ref 12.0–15.0)
MCH: 26.6 pg (ref 26.0–34.0)
MCHC: 31.7 g/dL (ref 30.0–36.0)
MCV: 84 fL (ref 80.0–100.0)
Platelets: 265 10*3/uL (ref 150–400)
RBC: 3.94 MIL/uL (ref 3.87–5.11)
RDW: 16.7 % — ABNORMAL HIGH (ref 11.5–15.5)
WBC: 5.9 10*3/uL (ref 4.0–10.5)
nRBC: 0.3 % — ABNORMAL HIGH (ref 0.0–0.2)

## 2022-02-11 LAB — ETHANOL: Alcohol, Ethyl (B): <10 mg/dL (ref <10)

## 2022-02-11 LAB — I-STAT CHEM 8, ED
BUN: 49 mg/dL — ABNORMAL HIGH (ref 6–20)
Calcium, Ion: 0.99 mmol/L — ABNORMAL LOW (ref 1.15–1.40)
Chloride: 101 mmol/L (ref 98–111)
Creatinine, Ser: 13.5 mg/dL — ABNORMAL HIGH (ref 0.44–1.00)
Glucose, Bld: 141 mg/dL — ABNORMAL HIGH (ref 70–99)
HCT: 35 % — ABNORMAL LOW (ref 36.0–46.0)
Hemoglobin: 11.9 g/dL — ABNORMAL LOW (ref 12.0–15.0)
Potassium: 4.5 mmol/L (ref 3.5–5.1)
Sodium: 141 mmol/L (ref 135–145)
TCO2: 27 mmol/L (ref 22–32)

## 2022-02-11 LAB — APTT: aPTT: 28 seconds (ref 24–36)

## 2022-02-11 LAB — CBG MONITORING, ED: Glucose-Capillary: 147 mg/dL — ABNORMAL HIGH (ref 70–99)

## 2022-02-11 LAB — DIFFERENTIAL
Abs Immature Granulocytes: 0.01 10*3/uL (ref 0.00–0.07)
Basophils Absolute: 0 10*3/uL (ref 0.0–0.1)
Basophils Relative: 1 %
Eosinophils Absolute: 0.2 10*3/uL (ref 0.0–0.5)
Eosinophils Relative: 3 %
Immature Granulocytes: 0 %
Lymphocytes Relative: 42 %
Lymphs Abs: 2.5 10*3/uL (ref 0.7–4.0)
Monocytes Absolute: 0.4 10*3/uL (ref 0.1–1.0)
Monocytes Relative: 8 %
Neutro Abs: 2.8 10*3/uL (ref 1.7–7.7)
Neutrophils Relative %: 46 %

## 2022-02-11 LAB — PROTIME-INR
INR: 1 (ref 0.8–1.2)
Prothrombin Time: 13.3 seconds (ref 11.4–15.2)

## 2022-02-11 LAB — I-STAT BETA HCG BLOOD, ED (MC, WL, AP ONLY): I-stat hCG, quantitative: 6.2 m[IU]/mL — ABNORMAL HIGH (ref <5)

## 2022-02-11 MED ORDER — SODIUM CHLORIDE 0.9% FLUSH
3.0000 mL | Freq: Once | INTRAVENOUS | Status: AC
  Administered 2022-02-12: 3 mL via INTRAVENOUS

## 2022-02-11 NOTE — Discharge Instructions (Addendum)
Your evaluation here did not show any serious condition.  Please follow-up with your neurologist.

## 2022-02-11 NOTE — ED Provider Notes (Signed)
[facility] Provider Note   CSN: [id number] Arrival date & time: 02/11/22  2322     History  No chief complaint on file.   Patricia [name] is a 47 y.o. female.  The history is provided by the patient.  She has history of hypertension, hyperlipidemia, end-stage renal disease on hemodialysis and comes in by ambulance as a code stroke.  She was last seen normal at 10 PM.  She has had facial twitching and EMS noted left arm weakness.  She also relates some difficulty speaking which has been waxing and waning.  She denies any new medications.  She denies any headache.   Home Medications Prior to Admission medications   Medication Sig Start Date End Date Taking? Authorizing Provider  acetaminophen (TYLENOL) 500 MG tablet Take 1,000 mg by mouth every 6 (six) hours as needed for moderate pain.    [provider]  allopurinol (ZYLOPRIM) 100 MG tablet Take 100 mg by mouth at bedtime.     [provider]  ALPRAZolam Duanne Moron) 1 MG tablet Take 1 mg by mouth 2 (two) times daily as needed for anxiety or sleep. 03/19/17   [provider]  carbidopa-levodopa (SINEMET IR) 25-100 MG tablet TAKE 1 TABLET BY MOUTH THREE TIMES DAILY 05/12/21   Marcial Pacas, MD  Diclofenac Sodium 1 % CREA 1 gram qid prn 05/26/21   Marcial Pacas, MD  DULoxetine (CYMBALTA) 30 MG capsule Take 1 capsule (30 mg total) by mouth daily. 05/26/21   Marcial Pacas, MD  ezetimibe (ZETIA) 10 MG tablet Take 10 mg by mouth daily. 02/21/21   [provider]  gabapentin (NEURONTIN) 100 MG capsule Take 3 capsules (300 mg total) by mouth 3 (three) times daily. 05/26/21   Marcial Pacas, MD  hydrOXYzine (ATARAX/VISTARIL) 25 MG tablet Take 25 mg by mouth every 6 (six) hours as needed for itching. 08/16/19   [provider]  incobotulinumtoxinA (XEOMIN) 50 units SOLR injection Inject 50 Units into the muscle every 3 (three) months. 05/27/21   Marcial Pacas, MD  lanthanum  (FOSRENOL) 1000 MG chewable tablet Chew 1,000-3,000 mg by mouth See admin instructions. Take 3000 mg with each meal and 1000-2000 mg with each snack 02/05/21   [provider]  lidocaine-prilocaine (EMLA) cream 1gram tid prn 05/26/21   Marcial Pacas, MD  metoprolol succinate (TOPROL-XL) 50 MG 24 hr tablet Take 50 mg by mouth every evening. Take with or immediately following a meal.    [provider]  multivitamin (RENA-VIT) TABS tablet Take 1 tablet by mouth at bedtime.     [provider]  omeprazole (PRILOSEC) 40 MG capsule Take 40 mg by mouth in the morning and at bedtime.    [provider]  traMADol (ULTRAM) 50 MG tablet Take 1 tablet (50 mg total) by mouth every 6 (six) hours as needed for moderate pain. 08/22/21   Annita Brod, MD  vortioxetine HBr (TRINTELLIX) 5 MG TABS tablet Take 5 mg by mouth daily.    [provider]  zolpidem (AMBIEN) 5 MG tablet Take 5 mg by mouth at bedtime. 04/07/21   [provider]      Allergies    Nsaids and Tolmetin    Review of Systems   Review of Systems  All other systems reviewed and are negative.   Physical Exam Updated Vital Signs BP (!) 176/90 (BP Location: Left Arm)   Pulse 87   Temp 98.3 F (36.8 C) (Oral)  Resp 16   Ht $R'5\' 7"'wi$  (1.702 m)   Wt (!) 207.2 kg   SpO2 99%   BMI 71.54 kg/m  Physical Exam Vitals and nursing note reviewed.   47 year old female, resting comfortably and in no acute distress. Vital signs are significant for elevated blood pressure. Oxygen saturation is 99%, which is normal. Head is normocephalic and atraumatic. PERRLA, EOMI. Oropharynx is clear. Neck is nontender and supple without adenopathy or JVD. Back is nontender and there is no CVA tenderness. Lungs are clear without rales, wheezes, or rhonchi. Chest is nontender. Heart has regular rate and rhythm without murmur. Abdomen is soft, flat, nontender without masses or hepatosplenomegaly and peristalsis is  normoactive. Extremities have no cyanosis or edema, full range of motion is present.  AV fistula present right upper arm with thrill present. Skin is warm and dry without rash. Neurologic: Awake and alert, speech is minimally dysarthric, facial twitching is noted but no facial weakness, cranial nerves are intact.  There is left arm pronator drift which, per EMS, is less prominent than it was on scene.  She has decreased sensation in left arm and left leg, decreased sensation in the distal leg consistent with history of neuropathy.  Finger-nose testing is normal.  ED Results / Procedures / Treatments   Labs (all labs ordered are listed, but only abnormal results are displayed) Labs Reviewed  CBC - Abnormal; Notable for the following components:      Result Value   Hemoglobin 10.5 (*)    HCT 33.1 (*)    RDW 16.7 (*)    nRBC 0.3 (*)    All other components within normal limits  I-STAT CHEM 8, ED - Abnormal; Notable for the following components:   BUN 49 (*)    Creatinine, Ser 13.50 (*)    Glucose, Bld 141 (*)    Calcium, Ion 0.99 (*)    Hemoglobin 11.9 (*)    HCT 35.0 (*)    All other components within normal limits  CBG MONITORING, ED - Abnormal; Notable for the following components:   Glucose-Capillary 147 (*)    All other components within normal limits  I-STAT BETA HCG BLOOD, ED ([facility], [facility], [facility] ONLY) - Abnormal; Notable for the following components:   I-stat hCG, quantitative 6.2 (*)    All other components within normal limits  PROTIME-INR  APTT  DIFFERENTIAL  ETHANOL  COMPREHENSIVE METABOLIC PANEL    EKG None  Radiology CT HEAD CODE STROKE WO CONTRAST  Result Date: 02/11/2022 CLINICAL DATA:  Code stroke. Initial evaluation for neuro deficit, stroke. And a EXAM: CT HEAD WITHOUT CONTRAST TECHNIQUE: Contiguous axial images were obtained from the base of the skull through the vertex without intravenous contrast. RADIATION DOSE REDUCTION: This exam was performed according to the  departmental dose-optimization program which includes automated exposure control, adjustment of the mA and/or kV according to patient size and/or use of iterative reconstruction technique. COMPARISON:  Prior study from 01/25/2022. FINDINGS: Brain: Cerebral volume within normal limits. No acute intracranial hemorrhage. No acute large vessel territory infarct. No mass lesion or midline shift. No hydrocephalus or extra-axial fluid collection. Vascular: No abnormal hyperdense vessel. Skull: Scalp soft tissues and calvarium within normal limits. Sinuses/Orbits: Globes orbital soft tissues demonstrate no acute finding. Paranasal sinuses are largely clear. No mastoid effusion. Other: None. ASPECTS [facility] Stroke Program Early CT Score) - Ganglionic level infarction (caudate, lentiform nuclei, internal capsule, insula, M1-M3 cortex): 7 - Supraganglionic infarction (M4-M6 cortex): 3 Total score (0-10  with 10 being normal): 10 IMPRESSION: 1. Normal head CT.  No acute intracranial abnormality. 2. ASPECTS is 10. These results were communicated to Dr. Curly Shores at 11:47 pm on 02/11/2022 by text page via the [facility] messaging system. Electronically Signed   By: Jeannine Boga M.D.   On: 02/11/2022 23:48    Procedures Procedures  Cardiac monitor shows normal sinus rhythm, per my interpretation.  Medications Ordered in ED Medications  sodium chloride flush (NS) 0.9 % injection 3 mL (has no administration in time range)    ED Course/ Medical Decision Making/ A&P                             Medical Decision Making Amount and/or Complexity of Data Reviewed Labs: ordered. Radiology: ordered.   Patient arrived as code stroke, but findings are much more consistent with either movement disorder such as "tardive dyskinesia or perhaps a supratentorial event.  On review of her records, it is noted that she is on duloxetine which has been associated with tardive dyskinesia.  On review of past records, she had a similar  presentation on 02/08/2020 with negative workup and has been followed by neurology as an outpatient.  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  No evidence of acute stroke or other acute process.  CT of head shows no acute process.  Have independently viewed the images, and agree with radiologist's interpretation.  I reviewed and interpreted her electrocardiogram, and my interpretation is normal ECG.  I have reviewed and interpreted her laboratory test, my interpretation is elevated BUN and creatinine consistent with known history of end-stage renal disease, stable anemia likely secondary to end-stage renal disease.  She will be referred back to her neurologist for further outpatient workup and treatment.  CRITICAL CARE Performed by: Delora Fuel Total critical care time: 35 minutes Critical care time was exclusive of separately billable procedures and treating other patients. Critical care was necessary to treat or prevent imminent or life-threatening deterioration. Critical care was time spent personally by me on the following activities: development of treatment plan with patient and/or surrogate as well as nursing, discussions with consultants, evaluation of patient's response to treatment, examination of patient, obtaining history from patient or surrogate, ordering and performing treatments and interventions, ordering and review of laboratory studies, ordering and review of radiographic studies, pulse oximetry and re-evaluation of patient's condition.  Final Clinical Impression(s) / ED Diagnoses Final diagnoses:  Movement disorder  End-stage renal disease on hemodialysis ([place])  Anemia associated with chronic renal failure  Elevated blood pressure reading with diagnosis of hypertension    Rx / DC Orders ED Discharge Orders     None         Delora Fuel, MD 62/94/76 2358

## 2022-02-11 NOTE — ED Triage Notes (Signed)
Pt arrives to ED c/o leftsided weakness and slurred speech. LKN 2200 per EMS. Pt reports feeling twitch and headache while at church and called EMS. Pt with renal hx

## 2022-02-11 NOTE — Consult Note (Addendum)
Neurology Consultation Reason for Consult: Code stroke Requesting Physician: Marcial Pacas  CC: Increased facial twitching  History is obtained from: Patient and chart review  HPI: Patricia [name] is a 48 y.o. female with a past medical history significant for facial spasms,  She reached out to her neurologist today regarding increased facial twitching which she associates with discontinuation of carbidopa/levodopa and need for Botox.  Due to worsening symptoms with difficulty speaking briefly and she came to the ED for further evaluation.  There was also some concern for left-sided weakness  Prior inpatient neurology evaluations included on January 19, 2015 when she had sudden collapse and abnormal movements with difficulty communicating, notable for multiple functional findings on examination, MRI brain negative, complex migraine on the differential.  In February 2022 she was seen for eye rolling mouth jerking and mouth movements of subacute duration worsening over the course of 2 months.  Again MRI and EEG were negative and she was referred for outpatient follow-up.  She was subsequently seen by Dr. Krista Blue in January 2023, noted to have almost constant eye blinking squinting movements and eyes rolling at the time, similar to today.  She was treated with a trial of Sinemet as well as Botox which she reports greatly improved her symptoms.  However she has been out of her Sinemet for a while, refills have expired,  LKW: 2 PM Thrombolytic given?: No, out of the window IA performed?: No, exam not c/w LVO Premorbid modified rankin scale:      2 - Slight disability. Able to look after own affairs without assistance, but unable to carry out all previous activities. (On disability due to general medical condition)  ROS: All other review of systems was negative except as noted in the HPI.   Past Medical History:  Diagnosis Date   Acid reflux    Anemia 04/2019   REQUIRING BLOOD TRANSFUSIONS    Anxiety    Arthritis    CHF (congestive heart failure) (HCC)    Dyspnea    with exertion   Dysrhythmia    tachycardia   ESRD (end stage renal disease) ([place])    [facility]- [place]   Facial twitching 11/2020   mouth twitch, eyes roll back, neurology started patient on low dose   Sinemet IR.   GI bleeding 12/2017   Gout    Headache(784.0)    "related to chemo; sometimes weekly" (09/12/2013)   Heart murmur    Systolic per Dr Dixie Dials' notes   High cholesterol    History of blood transfusion "a couple"   "related to low counts"   Hypertension    Iron deficiency anemia    "get epogen shots q month" (02/20/2014)   MCGN (minimal change glomerulonephritis)    "using chemo to tx;  finished my last tx in 12/2013"   Neuropathy    Peritoneal dialysis status ([place])    Stroke ([place]) 08/15/2017   ruled out that it was not a stroke   Past Surgical History:  Procedure Laterality Date   ABDOMINAL HYSTERECTOMY  2010   "laparoscopic"   ANKLE FRACTURE SURGERY Right 1994   AV FISTULA PLACEMENT Left 04/14/2016   Procedure: ARTERIOVENOUS (AV) FISTULA CREATION;  Surgeon: Angelia Mould, MD;  Location: [place];  Service: Vascular;  Laterality: Left;   AV FISTULA PLACEMENT Left 08/09/2017   Procedure: INSERTION OF ARTERIOVENOUS (AV) GORE-TEX GRAFT LEFT ARM;  Surgeon: Elam Dutch, MD;  Location: [place];  Service: Vascular;  Laterality: Left;   AV  FISTULA PLACEMENT Right 03/15/2020   Procedure: RIGHT ARM ARTERIOVENOUS (AV) FISTULA CREATION 1st Stage Basilic Transposition;  Surgeon: Serafina Mitchell, MD;  Location: [place];  Service: Vascular;  Laterality: Right;   AV FISTULA PLACEMENT Right 02/24/2021   Procedure: RIGHT ARM ARTERIOVENOUS  FISTULA CREATION;  Surgeon: Broadus John, MD;  Location: [place];  Service: Vascular;  Laterality: Right;  regional block   AVGG REMOVAL Left 08/11/2017   Procedure: REMOVAL OF ARTERIOVENOUS GORETEX GRAFT ([place]);  Surgeon: Serafina Mitchell, MD;  Location: [facility] OR;   Service: Vascular;  Laterality: Left;   [place] Right 05/17/2020   Procedure: RIGHT UPPER EXTREMITY SECOND STAGE [place];  Surgeon: Serafina Mitchell, MD;  Location: [place];  Service: Vascular;  Laterality: Right;   BIOPSY  09/03/2017   Procedure: BIOPSY;  Surgeon: Otis Brace, MD;  Location: [place] [facility];  Service: Gastroenterology;;   BIOPSY  12/24/2017   Procedure: BIOPSY;  Surgeon: Irving Copas., MD;  Location: [place];  Service: Gastroenterology;;   CARDIAC CATHETERIZATION  2000's   COLONOSCOPY WITH PROPOFOL N/A 09/04/2017   Procedure: COLONOSCOPY WITH PROPOFOL;  Surgeon: Ronnette Juniper, MD;  Location: [place];  Service: Gastroenterology;  Laterality: N/A;   ENTEROSCOPY N/A 12/24/2017   Procedure: ENTEROSCOPY;  Surgeon: Rush Landmark Telford Nab., MD;  Location: [place];  Service: Gastroenterology;  Laterality: N/A;   ESOPHAGOGASTRODUODENOSCOPY     ESOPHAGOGASTRODUODENOSCOPY (EGD) WITH PROPOFOL Left 06/04/2017   Procedure: ESOPHAGOGASTRODUODENOSCOPY (EGD) WITH PROPOFOL;  Surgeon: Arta Silence, MD;  Location: [place];  Service: Endoscopy;  Laterality: Left;   ESOPHAGOGASTRODUODENOSCOPY (EGD) WITH PROPOFOL N/A 09/03/2017   Procedure: ESOPHAGOGASTRODUODENOSCOPY (EGD) WITH PROPOFOL;  Surgeon: Otis Brace, MD;  Location: [place];  Service: Gastroenterology;  Laterality: N/A;   FISTULA SUPERFICIALIZATION Left 04/02/2017   Procedure: FISTULA SUPERFICIALIZATION LEFT ARM;  Surgeon: Serafina Mitchell, MD;  Location: [place];  Service: Vascular;  Laterality: Left;   FISTULA SUPERFICIALIZATION Right 04/14/2021   Procedure: RIGHT ARM FISTULA SUPERFICIALIZATION;  Surgeon: Broadus John, MD;  Location: [place];  Service: Vascular;  Laterality: Right;  PERIPHERAL NERVE BLOCK   FISTULOGRAM Left 04/07/2016   Procedure: FISTULOGRAM;  Surgeon: Waynetta Sandy, MD;  Location: [place];  Service: Vascular;  Laterality: Left;    FRACTURE SURGERY     right ankle   GIVENS CAPSULE STUDY N/A 10/29/2017   Procedure: GIVENS CAPSULE STUDY;  Surgeon: Wilford Corner, MD;  Location: [place];  Service: Endoscopy;  Laterality: N/A;   GIVENS CAPSULE STUDY N/A 12/24/2017   Procedure: GIVENS CAPSULE STUDY;  Surgeon: Irving Copas., MD;  Location: [place];  Service: Gastroenterology;  Laterality: N/A;   GIVENS CAPSULE STUDY N/A 05/22/2018   Procedure: GIVENS CAPSULE STUDY;  Surgeon: Irving Copas., MD;  Location: [place];  Service: Gastroenterology;  Laterality: N/A;   INSERTION OF DIALYSIS CATHETER Right 04/07/2016   Procedure: INSERTION OF DIALYSIS CATHETER;  Surgeon: Waynetta Sandy, MD;  Location: [place];  Service: Vascular;  Laterality: Right;   INSERTION OF DIALYSIS CATHETER Right 04/04/2017   Procedure: INSERTION OF DIALYSIS CATHETER;  Surgeon: Waynetta Sandy, MD;  Location: [place];  Service: Vascular;  Laterality: Right;   IR FLUORO GUIDE CV LINE LEFT  03/13/2020   IR US GUIDE VASC ACCESS LEFT  03/13/2020   LEFT HEART CATH AND CORONARY ANGIOGRAPHY N/A 05/12/2019   Procedure: LEFT HEART CATH AND CORONARY ANGIOGRAPHY;  Surgeon: Dixie Dials, MD;  Location: [place] CV LAB;  Service: Cardiovascular;  Laterality: N/A;   LIGATION OF ARTERIOVENOUS  FISTULA Left 04/14/2016   Procedure: LIGATION OF ARTERIOVENOUS  FISTULA;  Surgeon: Angelia Mould, MD;  Location: [place];  Service: Vascular;  Laterality: Left;   LIGATION OF COMPETING BRANCHES OF ARTERIOVENOUS FISTULA Right 05/17/2020   Procedure: LIGATION OF COMPETING BRANCHES OF RIGHT ARM ARTERIOVENOUS FISTULA;  Surgeon: Serafina Mitchell, MD;  Location: [place];  Service: Vascular;  Laterality: Right;   PATCH ANGIOPLASTY Left 06/19/2016   Procedure: PATCH ANGIOPLASTY LEFT ARTERIOVENOUS FISTULA;  Surgeon: Angelia Mould, MD;  Location: [place];  Service: Vascular;  Laterality: Left;   POLYPECTOMY  09/04/2017   Procedure:  POLYPECTOMY;  Surgeon: Ronnette Juniper, MD;  [facility];  Service: Gastroenterology;;   REVISON OF ARTERIOVENOUS FISTULA Left 06/19/2016   Procedure: REVISION SUPERFICIALIZATION OF BRACHIOCEPHALIC ARTERIOVENOUS FISTULA;  Surgeon: Angelia Mould, MD;  Location: [place];  Service: Vascular;  Laterality: Left;   UPPER EXTREMITY VENOGRAPHY Bilateral 02/19/2021   Procedure: UPPER EXTREMITY VENOGRAPHY;  Surgeon: Broadus John, MD;  Location: [place] CV LAB;  Service: Cardiovascular;  Laterality: Bilateral;   Current Outpatient Medications  Medication Instructions   acetaminophen (TYLENOL) 1,000 mg, Oral, Every 6 hours PRN   allopurinol (ZYLOPRIM) 100 mg, Oral, Daily at bedtime   ALPRAZolam (XANAX) 1 mg, Oral, 2 times daily PRN   carbidopa-levodopa (SINEMET IR) 25-100 MG tablet TAKE 1 TABLET BY MOUTH THREE TIMES DAILY   Diclofenac Sodium 1 % CREA 1 gram qid prn   DULoxetine (CYMBALTA) 30 mg, Oral, Daily   ezetimibe (ZETIA) 10 mg, Oral, Daily   gabapentin (NEURONTIN) 300 mg, Oral, 3 times daily   hydrOXYzine (ATARAX) 25 mg, Oral, Every 6 hours PRN   lanthanum (FOSRENOL) 1,000-3,000 mg, Oral, See admin instructions, Take 3000 mg with each meal and 1000-2000 mg with each snack   lidocaine-prilocaine (EMLA) cream 1gram tid prn   metoprolol succinate (TOPROL-XL) 50 mg, Oral, Every evening, Take with or immediately following a meal.   multivitamin (RENA-VIT) TABS tablet 1 tablet, Oral, Daily at bedtime   omeprazole (PRILOSEC) 40 mg, Oral, 2 times daily   traMADol (ULTRAM) 50 mg, Oral, Every 6 hours PRN   vortioxetine HBr (TRINTELLIX) 5 mg, Oral, Daily   Xeomin 50 Units, Intramuscular, Every 3 months   zolpidem (AMBIEN) 5 mg, Oral, Daily at bedtime     Family History  Problem Relation Age of Onset   Hypertension Mother    Thyroid disease Mother    Coronary artery disease Father    Hypertension Father    Diabetes Father    Colon cancer Maternal Aunt    Esophageal cancer  Maternal Uncle    Inflammatory bowel disease Neg Hx    Liver disease Neg Hx    Pancreatic cancer Neg Hx    Rectal cancer Neg Hx    Stomach cancer Neg Hx     Social History:  reports that she has never smoked. She has never used smokeless tobacco. She reports that she does not drink alcohol and does not use drugs.   Exam: Current vital signs: BP (!) 176/90 (BP Location: Left Arm)   Pulse 87   Temp 98.3 F (36.8 C) (Oral)   Resp 16   Ht $R'5\' 7"'zV$  (1.702 m)   Wt (!) 207.2 kg   SpO2 99%   BMI 71.54 kg/m  Vital signs in last 24 hours: Temp:  [98.3 F (36.8 C)] 98.3 F (36.8 C) (02/07 2354) Pulse Rate:  [87] 87 (02/07  2354) Resp:  [16] 16 (02/07 2354) BP: (176)/(90) 176/90 (02/07 2354) SpO2:  [99 %] 99 % (02/07 2354) Weight:  [207.2 kg] 207.2 kg (02/07 2355)   Physical Exam  Constitutional: Appears well-developed and well-nourished.  Psych: Affect mildly anxious but cooperative Eyes: No scleral injection HENT: No oropharyngeal obstruction.  MSK: no joint deformities.  Cardiovascular: Normal rate and regular rhythm.  Respiratory: Effort normal, non-labored breathing GI: Soft.  No distension. There is no tenderness.  Skin: Warm dry and intact visible skin  Neuro: Mental Status: Patient is awake, alert, oriented to person, place, month, year, and situation. Patient is able to give a clear and coherent history. No signs of aphasia or neglect Cranial Nerves: II: Visual Fields are full. Pupils are equal, round, and reactive to light.   III,IV, VI: EOMI without ptosis or diploplia.  V: Facial sensation is symmetric to temperature VII: Facial movement is symmetric.  Frequent lipsmacking, eye rolling, facial twitching movements VIII: hearing is intact to voice X: Uvula elevates symmetrically XI: Shoulder shrug is symmetric. XII: tongue is midline without atrophy or fasciculations.  Motor/sensory/gait: Tone is normal. Bulk is normal.  Poor effort on confrontational testing  evaluation.  However on casual observation using all 4 extremities equally.  Able to use her arms to help herself scoot to the side of the bed stand up.  Able to stand with her eyes closed without losing balance (Romberg negative) Sensory: Initially reports inability to feel anything in her feet at all, Cerebellar: Finger-nose intact bilaterally, heel-to-shin does not perform  Gait:  Ambulates with a shuffling gait with mild astasia-abasia   I have reviewed labs in epic and the results pertinent to this consultation are:  Basic Metabolic Panel: Recent Labs  Lab 02/11/22 2328 02/11/22 2330  NA 140 141  K 4.5 4.5  CL 97* 101  CO2 24  --   GLUCOSE 145* 141*  BUN 54* 49*  CREATININE 11.34* 13.50*  CALCIUM 8.6*  --     CBC: Recent Labs  Lab 02/11/22 2328 02/11/22 2330  WBC 5.9  --   NEUTROABS 2.8  --   HGB 10.5* 11.9*  HCT 33.1* 35.0*  MCV 84.0  --   PLT 265  --     Coagulation Studies: Recent Labs    02/11/22 2326-04-13  LABPROT 13.3  INR 1.0      I have reviewed the images obtained:  Head CT personally reviewed, agree with radiology no acute intracranial process  Impression: 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  Given patient reports that her symptoms have markedly improved during the course of my evaluation, she would prefer to avoid MRI due to her anxiety, and I think this is reasonable as she has no new objective findings on examination.  Code stroke recommendations: -Head CT to exclude acute intracranial process  Additional recommendations: -Patient can be provided literature on functional facial twitching from neurosymptoms.org, which may or may not be her diagnosis regarding facial twitching -Close outpatient follow-up with Dr. Krista Blue -Inpatient neurology will be available as needed, please reach out if any additional questions or concerns arise  Lesleigh Noe MD-PhD [facility] [phone] Available 7 PM to 7 AM, outside of these hours please call Neurologist on call as listed on Amion.  CRITICAL CARE Performed by: Lorenza Chick   Total critical care time: 30 minutes  Critical care time was exclusive of separately billable procedures and treating other  patients.  Critical care was necessary to treat or prevent imminent or life-threatening deterioration, emergent evaluation for acute changes in neurological status with potential to treat with TNK or thrombectomy  Critical care was time spent personally by me on the following activities: development of treatment plan with patient and/or surrogate as well as nursing, discussions with consultants, evaluation of patient's response to treatment, examination of patient, obtaining history from patient or surrogate, ordering and performing treatments and interventions, ordering and review of laboratory studies, ordering and review of radiographic studies, pulse oximetry and re-evaluation of patient's condition.

## 2022-02-12 ENCOUNTER — Other Ambulatory Visit: Payer: Self-pay

## 2022-02-12 ENCOUNTER — Other Ambulatory Visit: Payer: Self-pay | Admitting: Neurology

## 2022-02-12 LAB — COMPREHENSIVE METABOLIC PANEL
ALT: 16 U/L (ref 0–44)
AST: 25 U/L (ref 15–41)
Albumin: 3.5 g/dL (ref 3.5–5.0)
Alkaline Phosphatase: 111 U/L (ref 38–126)
Anion gap: 19 — ABNORMAL HIGH (ref 5–15)
BUN: 54 mg/dL — ABNORMAL HIGH (ref 6–20)
CO2: 24 mmol/L (ref 22–32)
Calcium: 8.6 mg/dL — ABNORMAL LOW (ref 8.9–10.3)
Chloride: 97 mmol/L — ABNORMAL LOW (ref 98–111)
Creatinine, Ser: 11.34 mg/dL — ABNORMAL HIGH (ref 0.44–1.00)
GFR, Estimated: 4 mL/min — ABNORMAL LOW (ref >60)
Glucose, Bld: 145 mg/dL — ABNORMAL HIGH (ref 70–99)
Potassium: 4.5 mmol/L (ref 3.5–5.1)
Sodium: 140 mmol/L (ref 135–145)
Total Bilirubin: 0.2 mg/dL — ABNORMAL LOW (ref 0.3–1.2)
Total Protein: 7.5 g/dL (ref 6.5–8.1)

## 2022-02-12 MED ORDER — CARBIDOPA-LEVODOPA 25-100 MG PO TABS: 1.0000 | ORAL_TABLET | Freq: Three times a day (TID) | ORAL | 1 refills | Status: DC

## 2022-02-20 ENCOUNTER — Ambulatory Visit: Payer: Medicare Other

## 2022-02-20 ENCOUNTER — Ambulatory Visit
Admission: RE | Admit: 2022-02-20 | Discharge: 2022-02-20 | Disposition: A | Discharge status: Home / Self Care | Payer: Medicare Other | Source: Ambulatory Visit | Attending: Nurse Practitioner | Admitting: Nurse Practitioner

## 2022-02-20 DIAGNOSIS — R1011 Right upper quadrant pain: Secondary | ICD-10-CM | POA: Insufficient documentation

## 2022-02-20 DIAGNOSIS — N186 End stage renal disease: Secondary | ICD-10-CM | POA: Diagnosis present

## 2022-02-20 DIAGNOSIS — R112 Nausea with vomiting, unspecified: Secondary | ICD-10-CM | POA: Insufficient documentation

## 2022-02-25 ENCOUNTER — Ambulatory Visit: Payer: Medicare Other | Admitting: Podiatry

## 2022-03-13 ENCOUNTER — Encounter
Admission: RE | Admit: 2022-03-13 | Discharge: 2022-03-13 | Disposition: A | Discharge status: Home / Self Care | Payer: Medicare Other | Source: Ambulatory Visit | Attending: Nurse Practitioner | Admitting: Nurse Practitioner

## 2022-03-13 DIAGNOSIS — R112 Nausea with vomiting, unspecified: Secondary | ICD-10-CM | POA: Insufficient documentation

## 2022-03-13 DIAGNOSIS — R1011 Right upper quadrant pain: Secondary | ICD-10-CM | POA: Insufficient documentation

## 2022-03-13 DIAGNOSIS — N186 End stage renal disease: Secondary | ICD-10-CM | POA: Diagnosis present

## 2022-03-13 MED ORDER — TECHNETIUM TC 99M SULFUR COLLOID
2.2600 | Freq: Once | INTRAVENOUS | Status: AC
  Administered 2022-03-13: 2.26 via ORAL

## 2022-03-16 ENCOUNTER — Ambulatory Visit (INDEPENDENT_AMBULATORY_CARE_PROVIDER_SITE_OTHER): Payer: Medicare Other | Admitting: Podiatry

## 2022-03-16 DIAGNOSIS — G609 Hereditary and idiopathic neuropathy, unspecified: Secondary | ICD-10-CM

## 2022-03-16 DIAGNOSIS — L84 Corns and callosities: Secondary | ICD-10-CM

## 2022-03-16 DIAGNOSIS — L97512 Non-pressure chronic ulcer of other part of right foot with fat layer exposed: Secondary | ICD-10-CM

## 2022-03-16 NOTE — Progress Notes (Signed)
Chief Complaint  Patient presents with   Callouses    est - right foot callus on the bottom of foot turning black    HPI: 47 y.o. female PMHx ESRD on hemodialysis presenting today for evaluation of symptomatic calluses/ulcers to the bilateral forefoot.  Patient states that she has had surgery in the past to the right foot to help alleviate pressure from these callused areas.  Presenting for further treatment evaluation.  She was last seen here in the office 05/14/2021 with Dr. Posey Pronto.  Past Medical History:  Diagnosis Date   Acid reflux    Anemia 04/2019   REQUIRING BLOOD TRANSFUSIONS   Anxiety    Arthritis    CHF (congestive heart failure) (HCC)    Dyspnea    with exertion   Dysrhythmia    tachycardia   ESRD (end stage renal disease) ([place])    [facility]AT- [facility]   Facial twitching 11/2020   mouth twitch, eyes roll back, neurology started patient on low dose   Sinemet IR.   GI bleeding 12/2017   Gout    Headache(784.0)    "related to chemo; sometimes weekly" (09/12/2013)   Heart murmur    Systolic per Dr Dixie Dials' notes   High cholesterol    History of blood transfusion "a couple"   "related to low counts"   Hypertension    Iron deficiency anemia    "get epogen shots q month" (02/20/2014)   MCGN (minimal change glomerulonephritis)    "using chemo to tx;  finished my last tx in 12/2013"   Neuropathy    Peritoneal dialysis status ([place])    Stroke ([place]) 08/15/2017   ruled out that it was not a stroke    Past Surgical History:  Procedure Laterality Date   ABDOMINAL HYSTERECTOMY  2010   "laparoscopic"   ANKLE FRACTURE SURGERY Right 1994   AV FISTULA PLACEMENT Left 04/14/2016   Procedure: ARTERIOVENOUS (AV) FISTULA CREATION;  Surgeon: Angelia Mould, MD;  Location: [place];  Service: Vascular;  Laterality: Left;   AV FISTULA PLACEMENT Left 08/09/2017   Procedure: INSERTION OF ARTERIOVENOUS (AV) GORE-TEX GRAFT LEFT ARM;  Surgeon: Elam Dutch, MD;  Location: [place];   Service: Vascular;  Laterality: Left;   AV FISTULA PLACEMENT Right 03/15/2020   Procedure: RIGHT ARM ARTERIOVENOUS (AV) FISTULA CREATION 1st Stage Basilic Transposition;  Surgeon: Serafina Mitchell, MD;  Location: [place];  Service: Vascular;  Laterality: Right;   AV FISTULA PLACEMENT Right 02/24/2021   Procedure: RIGHT ARM ARTERIOVENOUS  FISTULA CREATION;  Surgeon: Broadus John, MD;  Location: [place];  Service: Vascular;  Laterality: Right;  regional block   AVGG REMOVAL Left 08/11/2017   Procedure: REMOVAL OF ARTERIOVENOUS GORETEX GRAFT ([place]);  Surgeon: Serafina Mitchell, MD;  Location: [facility] OR;  Service: Vascular;  Laterality: Left;   [place] Right 05/17/2020   Procedure: RIGHT UPPER EXTREMITY SECOND STAGE [place];  Surgeon: Serafina Mitchell, MD;  Location: [place];  Service: Vascular;  Laterality: Right;   BIOPSY  09/03/2017   Procedure: BIOPSY;  Surgeon: Otis Brace, MD;  Location: [place] [facility]COPY;  Service: Gastroenterology;;   BIOPSY  12/24/2017   Procedure: BIOPSY;  Surgeon: Irving Copas., MD;  Location: [place];  Service: Gastroenterology;;   CARDIAC CATHETERIZATION  2000's   COLONOSCOPY WITH PROPOFOL N/A 09/04/2017   Procedure: COLONOSCOPY WITH PROPOFOL;  Surgeon: Ronnette Juniper, MD;  Location: [place];  Service: Gastroenterology;  Laterality: N/A;   ENTEROSCOPY  N/A 12/24/2017   Procedure: ENTEROSCOPY;  Surgeon: Rush Landmark Telford Nab., MD;  Location: [place];  Service: Gastroenterology;  Laterality: N/A;   ESOPHAGOGASTRODUODENOSCOPY     ESOPHAGOGASTRODUODENOSCOPY (EGD) WITH PROPOFOL Left 06/04/2017   Procedure: ESOPHAGOGASTRODUODENOSCOPY (EGD) WITH PROPOFOL;  Surgeon: Arta Silence, MD;  Location: [place];  Service: Endoscopy;  Laterality: Left;   ESOPHAGOGASTRODUODENOSCOPY (EGD) WITH PROPOFOL N/A 09/03/2017   Procedure: ESOPHAGOGASTRODUODENOSCOPY (EGD) WITH PROPOFOL;  Surgeon: Otis Brace, MD;  Location: [place];   Service: Gastroenterology;  Laterality: N/A;   FISTULA SUPERFICIALIZATION Left 04/02/2017   Procedure: FISTULA SUPERFICIALIZATION LEFT ARM;  Surgeon: Serafina Mitchell, MD;  Location[place];  Service: Vascular;  Laterality: Left;   FISTULA SUPERFICIALIZATION Right 04/14/2021   Procedure: RIGHT ARM FISTULA SUPERFICIALIZATION;  Surgeon: Broadus John, MD;  Location: [place];  Service: Vascular;  Laterality: Right;  PERIPHERAL NERVE BLOCK   FISTULOGRAM Left 04/07/2016   Procedure: FISTULOGRAM;  Surgeon: Waynetta Sandy, MD;  Location: [place];  Service: Vascular;  Laterality: Left;   FRACTURE SURGERY     right ankle   GIVENS CAPSULE STUDY N/A 10/29/2017   Procedure: GIVENS CAPSULE STUDY;  Surgeon: Wilford Corner, MD;  Location: [place];  Service: Endoscopy;  Laterality: N/A;   GIVENS CAPSULE STUDY N/A 12/24/2017   Procedure: GIVENS CAPSULE STUDY;  Surgeon: Irving Copas., MD;  Location: [place];  Service: Gastroenterology;  Laterality: N/A;   GIVENS CAPSULE STUDY N/A 05/22/2018   Procedure: GIVENS CAPSULE STUDY;  Surgeon: Irving Copas., MD;  Location: [place];  Service: Gastroenterology;  Laterality: N/A;   INSERTION OF DIALYSIS CATHETER Right 04/07/2016   Procedure: INSERTION OF DIALYSIS CATHETER;  Surgeon: Waynetta Sandy, MD;  Location: [place];  Service: Vascular;  Laterality: Right;   INSERTION OF DIALYSIS CATHETER Right 04/04/2017   Procedure: INSERTION OF DIALYSIS CATHETER;  Surgeon: Waynetta Sandy, MD;  Location: [place];  Service: Vascular;  Laterality: Right;   IR FLUORO GUIDE CV LINE LEFT  03/13/2020   IR US GUIDE VASC ACCESS LEFT  03/13/2020   LEFT HEART CATH AND CORONARY ANGIOGRAPHY N/A 05/12/2019   Procedure: LEFT HEART CATH AND CORONARY ANGIOGRAPHY;  Surgeon: Dixie Dials, MD;  Location: [place] CV LAB;  Service: Cardiovascular;  Laterality: N/A;   LIGATION OF ARTERIOVENOUS  FISTULA Left 04/14/2016   Procedure: LIGATION OF  ARTERIOVENOUS  FISTULA;  Surgeon: Angelia Mould, MD;  Location: [place];  Service: Vascular;  Laterality: Left;   LIGATION OF COMPETING BRANCHES OF ARTERIOVENOUS FISTULA Right 05/17/2020   Procedure: LIGATION OF COMPETING BRANCHES OF RIGHT ARM ARTERIOVENOUS FISTULA;  Surgeon: Serafina Mitchell, MD;  Location: [name];  Service: Vascular;  Laterality: Right;   PATCH ANGIOPLASTY Left 06/19/2016   Procedure: PATCH ANGIOPLASTY LEFT ARTERIOVENOUS FISTULA;  Surgeon: Angelia Mould, MD;  Location: [place];  Service: Vascular;  Laterality: Left;   POLYPECTOMY  09/04/2017   Procedure: POLYPECTOMY;  Surgeon: Ronnette Juniper, MD;  Location: [facility] ENDOSCOPY;  Service: Gastroenterology;;   REVISON OF ARTERIOVENOUS FISTULA Left 06/19/2016   Procedure: REVISION SUPERFICIALIZATION OF BRACHIOCEPHALIC ARTERIOVENOUS FISTULA;  Surgeon: Angelia Mould, MD;  Location: [place];  Service: Vascular;  Laterality: Left;   UPPER EXTREMITY VENOGRAPHY Bilateral 02/19/2021   Procedure: UPPER EXTREMITY VENOGRAPHY;  Surgeon: Broadus John, MD;  Location: [place] CV LAB;  Service: Cardiovascular;  Laterality: Bilateral;    Allergies  Allergen Reactions   Nsaids Other (See Comments)    Cannot take due to Kidney disease/Kidney function  Tolmetin Other (See Comments)    Cannot take due to Kidney Disease     LT foot 03/16/2022  RT foot 03/16/2022 Physical Exam: General: The patient is alert and oriented x3 in no acute distress.  Dermatology: Skin is warm, dry and supple bilateral lower extremities. Negative for open lesions or macerations.  Preulcerative callus noted to the left forefoot.  Ulcer also noted to the plantar aspect of the fifth MTP right foot measuring approximately 1.0 x 1.0 x 0.3 cm.  Granular wound base.  No exposed bone muscle tendon ligament or joint.  Periwound is callused.  Please see above noted photo  Vascular: Palpable pedal pulses bilaterally. Capillary refill within normal limits.   Negative for any significant edema or erythema  Neurological: Light touch and protective threshold diminished  Musculoskeletal Exam: No pedal deformities noted.  No prior amputations   Assessment: 1.  Ulcer right fifth plantar MTP without exposed 2.  Preulcerative callus left forefoot 3.  ESRD on dialysis.  Not diabetic   Plan of Care:  -Patient evaluated -Excisional debridement of the hyperkeratotic callus tissue to the left forefoot was performed today using a 312 scalpel without incident or bleeding. -Medically necessary excisional debridement including subcutaneous tissue was performed to the right plantar foot ulcer using a 312 scalpel.  Excisional debridement of the necrotic nonviable tissue down to healthier bleeding viable tissue was performed with postdebridement measurement same as pre- -Although the patient is not diabetic, her feet present as diabetic feet.  Patient would benefit from custom molded diabetic insoles to alleviate pressure from the forefoot.  Medically necessary. -Appointment with orthotics department for custom molded orthotic insoles -Return to clinic with me in 3 weeks for reevaluation of the bilateral foot ulcers     Edrick Kins, DPM [facility]  Dr. Edrick Kins, DPM    [address], [place] [address]                Office [phone]  Fax [phone]

## 2022-03-19 ENCOUNTER — Telehealth: Payer: Self-pay

## 2022-03-19 ENCOUNTER — Other Ambulatory Visit (HOSPITAL_COMMUNITY): Payer: Self-pay

## 2022-03-19 NOTE — Telephone Encounter (Signed)
Do you have the PA number and effective dates?

## 2022-03-19 NOTE — Telephone Encounter (Signed)
   Looks like there is still a PA on file that is good until 01/05/2023-it is ran under 100 units bc we cna not get 50 units vials-this can be filled at [facility].

## 2022-03-20 ENCOUNTER — Other Ambulatory Visit (HOSPITAL_COMMUNITY): Payer: Self-pay

## 2022-03-20 NOTE — Telephone Encounter (Signed)
    This will be Buy and Bill 

## 2022-03-21 ENCOUNTER — Other Ambulatory Visit: Payer: Self-pay

## 2022-03-21 ENCOUNTER — Emergency Department (HOSPITAL_COMMUNITY)
Admission: EM | Admit: 2022-03-21 | Discharge: 2022-03-21 | Disposition: A | Discharge status: Home / Self Care | Payer: Medicare Other | Attending: Emergency Medicine | Admitting: Emergency Medicine

## 2022-03-21 ENCOUNTER — Encounter (HOSPITAL_COMMUNITY): Payer: Self-pay

## 2022-03-21 ENCOUNTER — Emergency Department (HOSPITAL_COMMUNITY): Payer: Medicare Other

## 2022-03-21 DIAGNOSIS — I471 Supraventricular tachycardia, unspecified: Secondary | ICD-10-CM

## 2022-03-21 DIAGNOSIS — E1122 Type 2 diabetes mellitus with diabetic chronic kidney disease: Secondary | ICD-10-CM | POA: Insufficient documentation

## 2022-03-21 DIAGNOSIS — N186 End stage renal disease: Secondary | ICD-10-CM | POA: Diagnosis not present

## 2022-03-21 DIAGNOSIS — I509 Heart failure, unspecified: Secondary | ICD-10-CM | POA: Insufficient documentation

## 2022-03-21 DIAGNOSIS — R000 Tachycardia, unspecified: Secondary | ICD-10-CM | POA: Diagnosis present

## 2022-03-21 LAB — HCG, QUANTITATIVE, PREGNANCY: hCG, Beta Chain, Quant, S: 14 m[IU]/mL — ABNORMAL HIGH (ref <5)

## 2022-03-21 LAB — CBC WITH DIFFERENTIAL/PLATELET
Abs Immature Granulocytes: 0.02 10*3/uL (ref 0.00–0.07)
Basophils Absolute: 0 10*3/uL (ref 0.0–0.1)
Basophils Relative: 1 %
Eosinophils Absolute: 0.1 10*3/uL (ref 0.0–0.5)
Eosinophils Relative: 3 %
HCT: 38.3 % (ref 36.0–46.0)
Hemoglobin: 11.9 g/dL — ABNORMAL LOW (ref 12.0–15.0)
Immature Granulocytes: 0 %
Lymphocytes Relative: 41 %
Lymphs Abs: 2 10*3/uL (ref 0.7–4.0)
MCH: 26 pg (ref 26.0–34.0)
MCHC: 31.1 g/dL (ref 30.0–36.0)
MCV: 83.8 fL (ref 80.0–100.0)
Monocytes Absolute: 0.4 10*3/uL (ref 0.1–1.0)
Monocytes Relative: 9 %
Neutro Abs: 2.3 10*3/uL (ref 1.7–7.7)
Neutrophils Relative %: 46 %
Platelets: 231 10*3/uL (ref 150–400)
RBC: 4.57 MIL/uL (ref 3.87–5.11)
RDW: 16 % — ABNORMAL HIGH (ref 11.5–15.5)
WBC: 4.8 10*3/uL (ref 4.0–10.5)
nRBC: 0 % (ref 0.0–0.2)

## 2022-03-21 LAB — BASIC METABOLIC PANEL
Anion gap: 19 — ABNORMAL HIGH (ref 5–15)
BUN: 52 mg/dL — ABNORMAL HIGH (ref 6–20)
CO2: 23 mmol/L (ref 22–32)
Calcium: 8.6 mg/dL — ABNORMAL LOW (ref 8.9–10.3)
Chloride: 95 mmol/L — ABNORMAL LOW (ref 98–111)
Creatinine, Ser: 10.66 mg/dL — ABNORMAL HIGH (ref 0.44–1.00)
GFR, Estimated: 4 mL/min — ABNORMAL LOW (ref >60)
Glucose, Bld: 106 mg/dL — ABNORMAL HIGH (ref 70–99)
Potassium: 4.7 mmol/L (ref 3.5–5.1)
Sodium: 137 mmol/L (ref 135–145)

## 2022-03-21 LAB — TROPONIN I (HIGH SENSITIVITY)
Troponin I (High Sensitivity): 10 ng/L (ref <18)
Troponin I (High Sensitivity): 17 ng/L (ref <18)

## 2022-03-21 LAB — I-STAT BETA HCG BLOOD, ED (MC, WL, AP ONLY): I-stat hCG, quantitative: 9.1 m[IU]/mL — ABNORMAL HIGH (ref <5)

## 2022-03-21 LAB — TSH: TSH: 1.432 u[IU]/mL (ref 0.350–4.500)

## 2022-03-21 LAB — MAGNESIUM: Magnesium: 2.2 mg/dL (ref 1.7–2.4)

## 2022-03-21 NOTE — ED Triage Notes (Signed)
BIB EMS from home woke up feeling like heart "racing" attempted vagal reneouvers - without success.  EMS gave 6 mg adenosine then 12 mg adenosine with success.   Dialysis pt T-TH-SAT right arm restriction.

## 2022-03-21 NOTE — Discharge Instructions (Signed)
You were seen in the emergency department today for fast heart rate.  Please continue taking your metoprolol as prescribed.  Please return to the emergency department if you have recurrence of symptoms of fast heart rate.

## 2022-03-21 NOTE — ED Provider Notes (Signed)
[facility] Provider Note   CSN: [place]:[id number] Arrival date & time: 03/21/22  [id number]     History  Chief Complaint  Patient presents with   Tachycardia    Patricia [name] is a 47 y.o. female. With past medical history of stroke, CHF, ESRD on hemodialysis, type 2 diabetes, IDA who presents to the emergency department with tachycardia.  States she woke up early this morning to get ready for dialysis when she states that she felt funny.  States that she was having palpitations and shortness of breath.  She states that she called her daughter into the room with a pulse ox that she has and it showed that her heart rate was 180. She tried to relax and get her heart rate down but it only came down to 160 so she called EMS. EMS reports in SVT and gave her 6mg , 12mg  adenosine with conversion to sinus rhythm. Patient states she feels back to baseline now. Currently without chest pain, shortness of breath or palpitations. She denies ever having chest pain, syncope, nausea or diaphoresis. She has received all of her dialysis treatments. No recent illnesses. Taking medications as prescribed. States this has happened before.   On chart review, appears most recent SVT episode 04/26/21. She was seen in the ER. She was observed and discharged.   HPI     Home Medications Prior to Admission medications   Medication Sig Start Date End Date Taking? Authorizing Provider  acetaminophen (TYLENOL) 500 MG tablet Take 1,000 mg by mouth every 6 (six) hours as needed for moderate pain.    [provider]  allopurinol (ZYLOPRIM) 100 MG tablet Take 100 mg by mouth at bedtime.     [provider]  ALPRAZolam Duanne Moron) 1 MG tablet Take 1 mg by mouth 2 (two) times daily as needed for anxiety or sleep. 03/19/17   [provider]  carbidopa-levodopa (SINEMET IR) 25-100 MG tablet Take 1 tablet by mouth 3 (three) times daily. 02/12/22   Marcial Pacas, MD   Diclofenac Sodium 1 % CREA 1 gram qid prn 05/26/21   Marcial Pacas, MD  DULoxetine (CYMBALTA) 30 MG capsule Take 1 capsule (30 mg total) by mouth daily. 05/26/21   Marcial Pacas, MD  ezetimibe (ZETIA) 10 MG tablet Take 10 mg by mouth daily. 02/21/21   [provider]  gabapentin (NEURONTIN) 100 MG capsule Take 3 capsules (300 mg total) by mouth 3 (three) times daily. 05/26/21   Marcial Pacas, MD  hydrOXYzine (ATARAX/VISTARIL) 25 MG tablet Take 25 mg by mouth every 6 (six) hours as needed for itching. 08/16/19   [provider]  incobotulinumtoxinA (XEOMIN) 50 units SOLR injection Inject 50 Units into the muscle every 3 (three) months. 05/27/21   Marcial Pacas, MD  lanthanum (FOSRENOL) 1000 MG chewable tablet Chew 1,000-3,000 mg by mouth See admin instructions. Take 3000 mg with each meal and 1000-2000 mg with each snack 02/05/21   [provider]  lidocaine-prilocaine (EMLA) cream 1gram tid prn 05/26/21   Marcial Pacas, MD  metoprolol succinate (TOPROL-XL) 50 MG 24 hr tablet Take 50 mg by mouth every evening. Take with or immediately following a meal.    [provider]  multivitamin (RENA-VIT) TABS tablet Take 1 tablet by mouth at bedtime.     [provider]  omeprazole (PRILOSEC) 40 MG capsule Take 40 mg by mouth in the morning and at bedtime.    [provider]  traMADol Veatrice Bourbon) 50  MG tablet Take 1 tablet (50 mg total) by mouth every 6 (six) hours as needed for moderate pain. 08/22/21   Annita Brod, MD  vortioxetine HBr (TRINTELLIX) 5 MG TABS tablet Take 5 mg by mouth daily.    [provider]  zolpidem (AMBIEN) 5 MG tablet Take 5 mg by mouth at bedtime. 04/07/21   [provider]      Allergies    Nsaids and Tolmetin    Review of Systems   Review of Systems  Respiratory:  Positive for shortness of breath.   Cardiovascular:  Positive for palpitations.  All other systems reviewed and are negative.   Physical Exam Updated Vital  Signs BP 137/75   Pulse 82   Temp 98 F (36.7 C) (Oral)   Resp 18   SpO2 100%  Physical Exam Vitals and nursing note reviewed.  Constitutional:      General: She is not in acute distress.    Appearance: Normal appearance. She is obese. She is not ill-appearing or toxic-appearing.  HENT:     Head: Normocephalic.  Eyes:     General: No scleral icterus.    Extraocular Movements: Extraocular movements intact.  Cardiovascular:     Rate and Rhythm: Normal rate and regular rhythm.     Pulses: Normal pulses.     Heart sounds: No murmur heard. Pulmonary:     Effort: Pulmonary effort is normal. No respiratory distress.     Breath sounds: Normal breath sounds.  Abdominal:     General: Bowel sounds are normal.     Palpations: Abdomen is soft.  Skin:    General: Skin is warm and dry.     Capillary Refill: Capillary refill takes less than 2 seconds.  Neurological:     General: No focal deficit present.     Mental Status: She is alert and oriented to person, place, and time. Mental status is at baseline.  Psychiatric:        Mood and Affect: Mood normal.        Behavior: Behavior normal.        Thought Content: Thought content normal.        Judgment: Judgment normal.     ED Results / Procedures / Treatments   Labs (all labs ordered are listed, but only abnormal results are displayed) Labs Reviewed  CBC WITH DIFFERENTIAL/PLATELET - Abnormal; Notable for the following components:      Result Value   Hemoglobin 11.9 (*)    RDW 16.0 (*)    All other components within normal limits  BASIC METABOLIC PANEL - Abnormal; Notable for the following components:   Chloride 95 (*)    Glucose, Bld 106 (*)    BUN 52 (*)    Creatinine, Ser 10.66 (*)    Calcium 8.6 (*)    GFR, Estimated 4 (*)    Anion gap 19 (*)    All other components within normal limits  HCG, QUANTITATIVE, PREGNANCY - Abnormal; Notable for the following components:   hCG, Beta Chain, Quant, S 14 (*)    All other  components within normal limits  I-STAT BETA HCG BLOOD, ED ([facility], [facility], [facility] ONLY) - Abnormal; Notable for the following components:   I-stat hCG, quantitative 9.1 (*)    All other components within normal limits  MAGNESIUM  TSH  TROPONIN I (HIGH SENSITIVITY)  TROPONIN I (HIGH SENSITIVITY)    EKG EKG Interpretation  Date/Time:  Saturday March 21 2022 06:33:17 EDT Ventricular Rate:  94 PR Interval:  166 QRS Duration: 90 QT Interval:  370 QTC Calculation: 463 R Axis:   78 Text Interpretation: Sinus rhythm Low voltage, precordial leads Confirmed by Fredia Sorrow [phone]) on 03/21/2022 10:04:36 AM  Radiology DG Chest Portable 1 View  Result Date: 03/21/2022 CLINICAL DATA:  Pain. EXAM: PORTABLE CHEST 1 VIEW COMPARISON:  01/10/2022 FINDINGS: Vascular stent noted in the right axillary region. Heart size and mediastinal contours are unremarkable. Asymmetric elevation of right hemidiaphragm is unchanged. There is no pleural fluid or airspace disease. No interstitial edema. IMPRESSION: No acute cardiopulmonary abnormalities. Electronically Signed   By: Kerby Moors M.D.   On: 03/21/2022 07:14    Procedures Procedures   Medications Ordered in ED Medications - No data to display  ED Course/ Medical Decision Making/ A&P  Medical Decision Making Amount and/or Complexity of Data Reviewed Labs: ordered.   Initial Impression and Ddx 47 year old female who presents to the emergency department with tachycardia Patient PMH that increases complexity of ED encounter:  ESRD, IDA, T2DM, CHF, stroke  Interpretation of Diagnostics I independent reviewed and interpreted the labs as followed: Stable anemia, BUN, creatinine are consistent with her known end-stage renal disease.  Her potassium is normal.  Beta-hCG is always mildly elevated.  TSH and mag are normal.  - I independently visualized the following imaging with scope of interpretation limited to determining acute life threatening conditions  related to emergency care: CXR, which revealed no acute findings  Patient Reassessment and Ultimate Disposition/Management 47 year old female who presents to the emergency department with SVT episode prior to presentation now in sinus rhythm. On my evaluation she is well-appearing, nonseptic and nontoxic in appearance.  She is hemodynamically stable with a heart rate in the 80s.  Currently asymptomatic without chest pain or palpitations or lightheadedness or shortness of breath. Will obtain labs including a TSH and mag.  Will observe with hopeful discharge after laboratory workup.  Laboratory studies with stable anemia BMP with no significant electrolyte dysfunction.  She does have history of end-stage renal disease with elevated creatinine and BUN, however her potassium and sodium are normal.  Additionally I ordered a mag which is within normal limits.  Her troponin is normal as well as her TSH.  Her beta-hCG is always mildly elevated around 9.  Observed here for about 4 hours with no recurrence of SVT episode.  She continues to be in normal sinus rhythm and hemodynamically stable.  Unclear why she went into SVT this morning. However, has not recurred. Feel that she is appropriate for discharge with primary care, nephrology follow-up as regularly scheduled.  She has not taken her metoprolol yet today and I have instructed her to do so.  Given return precautions if symptoms return.  She verbalized understanding.  Otherwise feel that she is safe for discharge at this time.  The patient has been appropriately medically screened and/or stabilized in the ED. I have low suspicion for any other emergent medical condition which would require further screening, evaluation or treatment in the ED or require inpatient management. At time of discharge the patient is hemodynamically stable and in no acute distress. I have discussed work-up results and diagnosis with patient and answered all questions. Patient is  agreeable with discharge plan. We discussed strict return precautions for returning to the emergency department and they verbalized understanding.     Patient management required discussion with the following services or consulting groups:  None  Complexity of Problems Addressed Acute complicated illness or Injury  Additional Data Reviewed and Analyzed Further history obtained from: Past medical history and medications listed in the EMR, Prior ED visit notes, and Care Everywhere  Patient Encounter Risk Assessment SDOH impact on management and Consideration of hospitalization  Final Clinical Impression(s) / ED Diagnoses Final diagnoses:  SVT (supraventricular tachycardia)    Rx / DC Orders ED Discharge Orders     None         Mickie Hillier, PA-C 03/21/22 1029    Fredia Sorrow, MD 03/22/22 [phone]

## 2022-03-23 ENCOUNTER — Other Ambulatory Visit (HOSPITAL_COMMUNITY): Payer: Self-pay

## 2022-03-23 ENCOUNTER — Other Ambulatory Visit: Payer: Self-pay

## 2022-03-23 MED ORDER — XEOMIN 100 UNITS IM SOLR
100.0000 [IU] | Freq: Once | INTRAMUSCULAR | 0 refills | Status: AC
  Filled 2022-03-23 (×2): qty 1, 1d supply, fill #0

## 2022-03-23 NOTE — Addendum Note (Signed)
Addended by: Kristen Loader on: 03/23/2022 10:29 AM   Modules accepted: Orders

## 2022-03-24 NOTE — Telephone Encounter (Signed)
Changed 4/22 OV to Xeomin appt, Dr. Krista Blue did not have anything sooner. Pt is on the wait list.

## 2022-03-25 ENCOUNTER — Other Ambulatory Visit: Payer: Self-pay

## 2022-03-25 ENCOUNTER — Ambulatory Visit: Payer: Medicare Other | Admitting: Podiatry

## 2022-03-26 ENCOUNTER — Other Ambulatory Visit (HOSPITAL_COMMUNITY): Payer: Self-pay

## 2022-03-27 ENCOUNTER — Other Ambulatory Visit: Payer: Self-pay

## 2022-03-31 ENCOUNTER — Other Ambulatory Visit (HOSPITAL_COMMUNITY): Payer: Self-pay

## 2022-04-01 ENCOUNTER — Other Ambulatory Visit (HOSPITAL_COMMUNITY): Payer: Self-pay

## 2022-04-08 ENCOUNTER — Other Ambulatory Visit: Payer: Medicare Other

## 2022-04-08 ENCOUNTER — Ambulatory Visit: Payer: Medicare Other | Admitting: Podiatry

## 2022-04-08 ENCOUNTER — Other Ambulatory Visit: Payer: Self-pay | Admitting: Neurology

## 2022-04-14 ENCOUNTER — Ambulatory Visit: Payer: Medicare Other | Admitting: Podiatry

## 2022-04-14 ENCOUNTER — Encounter: Payer: Self-pay | Admitting: Podiatry

## 2022-04-14 DIAGNOSIS — L97512 Non-pressure chronic ulcer of other part of right foot with fat layer exposed: Secondary | ICD-10-CM

## 2022-04-14 NOTE — Progress Notes (Signed)
Chief Complaint  Patient presents with   Wound Check    "I think they're getting better.  Part of my toenail that turned black came off."    HPI: 47 y.o. female PMHx ESRD on hemodialysis presenting today for follow-up evaluation of ulcers to the plantar aspect of the bilateral feet.  Patient states that she is doing well.  There is only 1 appointment for custom molded diabetic insoles are pending.  Although she is not diabetic her feet present as diabetic ulcers.  Presenting for follow-up treatment and evaluation  Past Medical History:  Diagnosis Date   Acid reflux    Anemia 04/2019   REQUIRING BLOOD TRANSFUSIONS   Anxiety    Arthritis    CHF (congestive heart failure)    Dyspnea    with exertion   Dysrhythmia    tachycardia   ESRD (end stage renal disease)    [facility]- [place]   Facial twitching 11/2020   mouth twitch, eyes roll back, neurology started patient on low dose   Sinemet IR.   GI bleeding 12/2017   Gout    Headache(784.0)    "related to chemo; sometimes weekly" (09/12/2013)   Heart murmur    Systolic per Dr Orpah Cobb' notes   High cholesterol    History of blood transfusion "a couple"   "related to low counts"   Hypertension    Iron deficiency anemia    "get epogen shots q month" (02/20/2014)   MCGN (minimal change glomerulonephritis)    "using chemo to tx;  finished my last tx in 12/2013"   Neuropathy    Peritoneal dialysis status    Stroke 08/15/2017   ruled out that it was not a stroke    Past Surgical History:  Procedure Laterality Date   ABDOMINAL HYSTERECTOMY  2010   "laparoscopic"   ANKLE FRACTURE SURGERY Right 1994   AV FISTULA PLACEMENT Left 04/14/2016   Procedure: ARTERIOVENOUS (AV) FISTULA CREATION;  Surgeon: Chuck Hint, MD;  Location: [facility] OR;  Service: Vascular;  Laterality: Left;   AV FISTULA PLACEMENT Left 08/09/2017   Procedure: INSERTION OF ARTERIOVENOUS (AV) GORE-TEX GRAFT LEFT ARM;  Surgeon: Sherren Kerns, MD;  [facility];  Service: Vascular;  Laterality: Left;   AV FISTULA PLACEMENT Right 03/15/2020   Procedure: RIGHT ARM ARTERIOVENOUS (AV) FISTULA CREATION 1st Stage Basilic Transposition;  Surgeon: Nada Libman, MD;  [facility] OR;  Service: Vascular;  Laterality: Right;   AV FISTULA PLACEMENT Right 02/24/2021   Procedure: RIGHT ARM ARTERIOVENOUS  FISTULA CREATION;  Surgeon: Victorino Sparrow, MD;  Location: [facility] OR;  Service: Vascular;  Laterality: Right;  regional block   AVGG REMOVAL Left 08/11/2017   Procedure: REMOVAL OF ARTERIOVENOUS GORETEX GRAFT (AVGG);  Surgeon: Nada Libman, MD;  Location: [facility] OR;  Service: Vascular;  Laterality: Left;   BASCILIC VEIN TRANSPOSITION Right 05/17/2020   Procedure: RIGHT UPPER EXTREMITY SECOND STAGE BASCILIC VEIN TRANSPOSITION;  Surgeon: Nada Libman, MD;  [facility] OR;  Service: Vascular;  Laterality: Right;   BIOPSY  09/03/2017   Procedure: BIOPSY;  Surgeon: Kathi Der, MD;  [facility] ENDOSCOPY;  Service: Gastroenterology;;   BIOPSY  12/24/2017   Procedure: BIOPSY;  Surgeon: Lemar Lofty., MD;  Location: [facility] ENDOSCOPY;  Service: Gastroenterology;;   CARDIAC CATHETERIZATION  2000's   COLONOSCOPY WITH PROPOFOL N/A 09/04/2017   Procedure: COLONOSCOPY WITH PROPOFOL;  Surgeon: Kerin Salen, MD;  Location: [facility] ENDOSCOPY;  Service: Gastroenterology;  Laterality: N/A;  ENTEROSCOPY N/A 12/24/2017   Procedure: ENTEROSCOPY;  Surgeon: Meridee Score Netty Starring., MD;  Location: [facility] ENDOSCOPY;  Service: Gastroenterology;  Laterality: N/A;   ESOPHAGOGASTRODUODENOSCOPY     ESOPHAGOGASTRODUODENOSCOPY (EGD) WITH PROPOFOL Left 06/04/2017   Procedure: ESOPHAGOGASTRODUODENOSCOPY (EGD) WITH PROPOFOL;  Surgeon: Willis Modena, MD;  Location: [facility] ENDOSCOPY;  Service: Endoscopy;  Laterality: Left;   ESOPHAGOGASTRODUODENOSCOPY (EGD) WITH PROPOFOL N/A 09/03/2017   Procedure: ESOPHAGOGASTRODUODENOSCOPY (EGD) WITH PROPOFOL;  Surgeon: Kathi Der, MD;  [facility]  ENDOSCOPY;  Service: Gastroenterology;  Laterality: N/A;   FISTULA SUPERFICIALIZATION Left 04/02/2017   Procedure: FISTULA SUPERFICIALIZATION LEFT ARM;  Surgeon: Nada Libman, MD;  Location: [facility] OR;  Service: Vascular;  Laterality: Left;   FISTULA SUPERFICIALIZATION Right 04/14/2021   Procedure: RIGHT ARM FISTULA SUPERFICIALIZATION;  Surgeon: Victorino Sparrow, MD;  Location: [facility] OR;  Service: Vascular;  Laterality: Right;  PERIPHERAL NERVE BLOCK   FISTULOGRAM Left 04/07/2016   Procedure: FISTULOGRAM;  Surgeon: Maeola Harman, MD;  Location: [facility] OR;  Service: Vascular;  Laterality: Left;   FRACTURE SURGERY     right ankle   GIVENS CAPSULE STUDY N/A 10/29/2017   Procedure: GIVENS CAPSULE STUDY;  Surgeon: Charlott Rakes, MD;  Location: [facility] ENDOSCOPY;  Service: Endoscopy;  Laterality: N/A;   GIVENS CAPSULE STUDY N/A 12/24/2017   Procedure: GIVENS CAPSULE STUDY;  Surgeon: Lemar Lofty., MD;  Location: [facility] ENDOSCOPY;  Service: Gastroenterology;  Laterality: N/A;   GIVENS CAPSULE STUDY N/A 05/22/2018   Procedure: GIVENS CAPSULE STUDY;  Surgeon: Lemar Lofty., MD;  Location: [facility] ENDOSCOPY;  Service: Gastroenterology;  Laterality: N/A;   INSERTION OF DIALYSIS CATHETER Right 04/07/2016   Procedure: INSERTION OF DIALYSIS CATHETER;  Surgeon: Maeola Harman, MD;  Location: [facility] OR;  Service: Vascular;  Laterality: Right;   INSERTION OF DIALYSIS CATHETER Right 04/04/2017   Procedure: INSERTION OF DIALYSIS CATHETER;  Surgeon: Maeola Harman, MD;  Location: [facility] OR;  Service: Vascular;  Laterality: Right;   IR FLUORO GUIDE CV LINE LEFT  03/13/2020   IR US GUIDE VASC ACCESS LEFT  03/13/2020   LEFT HEART CATH AND CORONARY ANGIOGRAPHY N/A 05/12/2019   Procedure: LEFT HEART CATH AND CORONARY ANGIOGRAPHY;  Surgeon: Orpah Cobb, MD;  [facility] INVASIVE [facility] LAB;  Service: Cardiovascular;  Laterality: N/A;   LIGATION OF ARTERIOVENOUS  FISTULA Left 04/14/2016   Procedure: LIGATION  OF ARTERIOVENOUS  FISTULA;  Surgeon: Chuck Hint, MD;  Location: [facility] OR;  Service: Vascular;  Laterality: Left;   LIGATION OF COMPETING BRANCHES OF ARTERIOVENOUS FISTULA Right 05/17/2020   Procedure: LIGATION OF COMPETING BRANCHES OF RIGHT ARM ARTERIOVENOUS FISTULA;  Surgeon: Nada Libman, MD;  [facility] OR;  Service: Vascular;  Laterality: Right;   PATCH ANGIOPLASTY Left 06/19/2016   Procedure: PATCH ANGIOPLASTY LEFT ARTERIOVENOUS FISTULA;  Surgeon: Chuck Hint, MD;  Location: [facility] OR;  Service: Vascular;  Laterality: Left;   POLYPECTOMY  09/04/2017   Procedure: POLYPECTOMY;  Surgeon: Kerin Salen, MD;  Location: [facility] ENDOSCOPY;  Service: Gastroenterology;;   REVISON OF ARTERIOVENOUS FISTULA Left 06/19/2016   Procedure: REVISION SUPERFICIALIZATION OF BRACHIOCEPHALIC ARTERIOVENOUS FISTULA;  Surgeon: Chuck Hint, MD;  Location: [facility] OR;  Service: Vascular;  Laterality: Left;   UPPER EXTREMITY VENOGRAPHY Bilateral 02/19/2021   Procedure: UPPER EXTREMITY VENOGRAPHY;  Surgeon: Victorino Sparrow, MD;  Location: [facility] INVASIVE CV LAB;  Service: Cardiovascular;  Laterality: Bilateral;    Allergies  Allergen Reactions   Nsaids Other (See Comments)    Cannot take due to Kidney disease/Kidney  function    Tolmetin Other (See Comments)    Cannot take due to Kidney Disease     LT foot 03/16/2022  RT foot 03/16/2022   B/L feet 04/14/2022  Physical Exam: General: The patient is alert and oriented x3 in no acute distress.  Dermatology: Skin is warm, dry and supple bilateral lower extremities.  Ulcers to the bilateral feet have resolved and healed.  Complete reepithelialization has occurred.  No open wounds noted    Vascular: Palpable pedal pulses bilaterally. Capillary refill within normal limits.  Negative for any significant edema or erythema  Neurological: Light touch and protective threshold diminished  Musculoskeletal Exam: No pedal deformities noted.  No prior  amputations   Assessment: 1.  Ulcer right fifth plantar MTP without exposed 2.  Preulcerative callus left forefoot 3.  ESRD on dialysis.  Not diabetic   Plan of Care:  -Patient evaluated - Excisional debridement of the hyperkeratotic callus tissue was performed today using a 312 scalpel without incident or bleeding. -Advised against going barefoot. -Custom molded orthotics pending.  Recommend diabetic type accommodative custom insoles to offload pressure from the forefoot -Return to clinic 4 weeks   Felecia ShellingBrent M. Davonna Ertl, DPM [facility]  Dr. Felecia ShellingBrent M. Tarren Velardi, DPM    [address]                                        [place], [place] [id number]                Office [phone]  Fax [phone]

## 2022-04-20 ENCOUNTER — Encounter: Payer: Self-pay | Admitting: *Deleted

## 2022-04-20 ENCOUNTER — Telehealth: Payer: Self-pay | Admitting: Neurology

## 2022-04-20 NOTE — Telephone Encounter (Signed)
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

## 2022-04-20 NOTE — Telephone Encounter (Signed)
Pt states she has an endoscopy same morning as appt for injection. Let her know Dr. Terrace Arabia is booked out until August if she were to reschedule. Would like to know if an NP can give her the Xeomin injection, if she can be squeezed in a spot on another day or same day at a later time. Would like a call back to discuss.

## 2022-04-21 ENCOUNTER — Other Ambulatory Visit (HOSPITAL_COMMUNITY): Payer: Self-pay

## 2022-04-21 ENCOUNTER — Ambulatory Visit: Payer: Medicare Other

## 2022-04-21 DIAGNOSIS — M216X1 Other acquired deformities of right foot: Secondary | ICD-10-CM

## 2022-04-21 DIAGNOSIS — G609 Hereditary and idiopathic neuropathy, unspecified: Secondary | ICD-10-CM

## 2022-04-21 DIAGNOSIS — L97512 Non-pressure chronic ulcer of other part of right foot with fat layer exposed: Secondary | ICD-10-CM

## 2022-04-21 NOTE — Progress Notes (Unsigned)
Patient presents today to be casted for custom molded orthotics. EVANS is the treating physician.  Impression foam cast was taken. ABN signed.  Patient info-  Shoe size: ***  Shoe style: ***  Height: ***  Weight: ***  Insurance: [employer] MEDICARE   Patient will be notified once orthotics arrive in office and reappoint for fitting at that time.

## 2022-04-21 NOTE — Telephone Encounter (Signed)
Called and spoke to pt and she was agreeable to change appt

## 2022-04-23 ENCOUNTER — Ambulatory Visit: Payer: Medicare Other | Admitting: Neurology

## 2022-04-23 ENCOUNTER — Other Ambulatory Visit (HOSPITAL_COMMUNITY): Payer: Self-pay

## 2022-04-23 VITALS — BP 136/96

## 2022-04-23 DIAGNOSIS — G63 Polyneuropathy in diseases classified elsewhere: Secondary | ICD-10-CM | POA: Diagnosis not present

## 2022-04-23 DIAGNOSIS — G245 Blepharospasm: Secondary | ICD-10-CM | POA: Diagnosis not present

## 2022-04-23 DIAGNOSIS — G2581 Restless legs syndrome: Secondary | ICD-10-CM | POA: Insufficient documentation

## 2022-04-23 MED ORDER — ROPINIROLE HCL 1 MG PO TABS: 2.0000 mg | ORAL_TABLET | Freq: Three times a day (TID) | ORAL | 6 refills | Status: DC

## 2022-04-23 MED ORDER — INCOBOTULINUMTOXINA 100 UNITS IM SOLR
100.0000 [IU] | Freq: Once | INTRAMUSCULAR | Status: AC
  Administered 2022-04-29: 100 [IU] via INTRAMUSCULAR

## 2022-04-23 NOTE — Progress Notes (Signed)
Chief Complaint  Patient presents with   Procedure    Rm 14, alone Pt stated bilateral foot tingling occurring and is ready for injection  Xenomin injection      ASSESSMENT AND PLAN  Patricia [name] is a 47 y.o. female   Peripheral neuropathy Worsening neuropathic pain, restless leg symptoms,  Most likely related to her end-stage renal disease,  Tolerating gabapentin 100 mg 3 times a day, titrating up to 900 mg maximum daily  Keep  Cymbalta 30 mg daily, add on requip  qhs  Diclofenac gel and mix with EMLA Gel for symptomatic control     Significantly elevated ferritin level  Ferritin level was 2832 in Jan 23, most like related to her end stage disease.  Gradual onset abnormal facial movement  In the setting of end-stage renal disease, on dialysis, could indicate bilateral basal ganglion structural versus mineral deposit abnormalities  MRI of brain in January 2023 that was normal  Laboratory evaluation showed significantly elevated ferritin 2832, iron 176, saturation 68%,  No benefit from short trial of Sinemet 25/100 3 times daily   EMG guided xeomin injection for blepharospasm, facial grimace, (100 units of Xeomin is dissolved into 2 cc of normal saline) 2.5 units each x15 =37.5 units, discard 62.5 units      DIAGNOSTIC DATA (LABS, IMAGING, TESTING) - I reviewed patient records, labs, notes, testing and imaging myself where available.   MEDICAL HISTORY:  Patricia [name] is a 47 year old female, seen in request by her PA Betsey Holiday for evaluation of eye facial twitching and eye rolling movement, she is accompanied by her daughter at today's visit on January 13, 2021  I reviewed and summarized the referring note. PMHx. 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 since worsening renal disease, went on dialysis, since 2018, she also was put on Trintellix for depression since  November 2022, it does help her depression, but she ran out of the supply over the past few weeks, there was no significant change in her abnormal facial movement  Her abnormal facial movements started around November 2022, initially she has frequent eye rolling back movement, herself did not aware of it, was noticed by her daughter, later she also developed squinting facial movement, frequent blinking, no passing out,  She denies significant gait abnormality,  UPDATE May 26 2021: She complains that her hands and feet paresthesia, burning pain, bothering her all day, especially at nighttime, tried gabapentin up to 100 mg 3 times a day, without significant side effect,  Laboratory evaluation for evaluation of peripheral neuropathy in 2023 showed normal negative TSH, CPK, folic acid, A1c was mildly elevated 5.8, mild elevated C-reactive protein 12, ESR of 49, normal copper, ANA, ceruloplasmin, zinc, creatinine was 11, potassium 5.6, ferritin level was significantly elevated 2822, with elevated iron saturation 68, CBC showed hemoglobin of 9.7, RDW was 19.3, elevated Epstein-Barr virus IgG, VZV antibody, measle antibody negative RPR HIV  This is her first pain EMG guided xeomin injection for blepharospasm  PHYSICAL EXAM:   Vitals:   04/23/22 1523  BP: (!) 136/96   There is no height or weight on file to calculate BMI.  PHYSICAL EXAMNIATION:  Gen: NAD, conversant, well nourised, well groomed                     Cardiovascular: Regular rate rhythm, no peripheral edema, warm, nontender. Eyes: Conjunctivae clear  without exudates or hemorrhage Neck: Supple, no carotid bruits. Pulmonary: Clear to auscultation bilaterally   NEUROLOGICAL EXAM:  MENTAL STATUS: She has almost constant eye blinking, squinting movement, eye rolling backwards, Speech/cognition: Awake, alert, oriented in history taking and casual conversation   CRANIAL NERVES: CN II: Visual fields are full to confrontation. Pupils  are round equal and briskly reactive to light. CN III, IV, VI: extraocular movement are normal. No ptosis. CN V: Facial sensation is intact to light touch CN VII: Face is symmetric with normal eye closure  CN VIII: Hearing is normal to causal conversation. CN IX, X: Phonation is normal. CN XI: Head turning and shoulder shrug are intact  MOTOR: Normal motor strength, left chest dialysis access, right lateral plantar surface callus, right foot swelling  REFLEXES: Reflexes are 1 and symmetric at the biceps, triceps, knees, and absent at ankles. Plantar responses are flexor.  SENSORY: Length-dependent decreased light touch, pinprick and vibratory sensation to mid shin level  COORDINATION: There is no trunk or limb dysmetria noted.  GAIT/STANCE: Need push-up to get up from sitting position, mildly antalgic,  REVIEW OF SYSTEMS:  Full 14 system review of systems performed and notable only for as above All other review of systems were negative.   ALLERGIES: Allergies  Allergen Reactions   Nsaids Other (See Comments)    Cannot take due to Kidney disease/Kidney function    Tolmetin Other (See Comments)    Cannot take due to Kidney Disease    HOME MEDICATIONS: Current Outpatient Medications  Medication Sig Dispense Refill   acetaminophen (TYLENOL) 500 MG tablet Take 1,000 mg by mouth every 6 (six) hours as needed for moderate pain.     allopurinol (ZYLOPRIM) 100 MG tablet Take 100 mg by mouth at bedtime.      ALPRAZolam (XANAX) 1 MG tablet Take 1 mg by mouth 2 (two) times daily as needed for anxiety or sleep.     carbidopa-levodopa (SINEMET IR) 25-100 MG tablet TAKE 1 TABLET BY MOUTH THREE TIMES DAILY 90 tablet 0   Diclofenac Sodium 1 % CREA 1 gram qid prn 120 g 11   DULoxetine (CYMBALTA) 30 MG capsule Take 1 capsule (30 mg total) by mouth daily. 30 capsule 11   ezetimibe (ZETIA) 10 MG tablet Take 10 mg by mouth daily.     gabapentin (NEURONTIN) 100 MG capsule Take 3 capsules (300  mg total) by mouth 3 (three) times daily. 270 capsule 11   hydrOXYzine (ATARAX/VISTARIL) 25 MG tablet Take 25 mg by mouth every 6 (six) hours as needed for itching.     incobotulinumtoxinA (XEOMIN) 50 units SOLR injection Inject 50 Units into the muscle every 3 (three) months. 1 each 3   lanthanum (FOSRENOL) 1000 MG chewable tablet Chew 1,000-3,000 mg by mouth See admin instructions. Take 3000 mg with each meal and 1000-2000 mg with each snack     lidocaine-prilocaine (EMLA) cream 1gram tid prn 30 g 11   metoprolol succinate (TOPROL-XL) 50 MG 24 hr tablet Take 50 mg by mouth every evening. Take with or immediately following a meal.     multivitamin (RENA-VIT) TABS tablet Take 1 tablet by mouth at bedtime.      omeprazole (PRILOSEC) 40 MG capsule Take 40 mg by mouth in the morning and at bedtime.     traMADol (ULTRAM) 50 MG tablet Take 1 tablet (50 mg total) by mouth every 6 (six) hours as needed for moderate pain. 20 tablet 0   vortioxetine HBr (TRINTELLIX) 5 MG  TABS tablet Take 5 mg by mouth daily.     zolpidem (AMBIEN) 5 MG tablet Take 5 mg by mouth at bedtime.     Current Facility-Administered Medications  Medication Dose Route Frequency Provider Last Rate Last Admin   incobotulinumtoxinA (XEOMIN) 100 units injection 100 Units  100 Units Intramuscular Once Levert Feinstein, MD        PAST MEDICAL HISTORY: Past Medical History:  Diagnosis Date   Acid reflux    Anemia 04/2019   REQUIRING BLOOD TRANSFUSIONS   Anxiety    Arthritis    CHF (congestive heart failure)    Dyspnea    with exertion   Dysrhythmia    tachycardia   ESRD (end stage renal disease)    [facility]   Facial twitching 11/2020   mouth twitch, eyes roll back, neurology started patient on low dose   Sinemet IR.   GI bleeding 12/2017   Gout    Headache(784.0)    "related to chemo; sometimes weekly" (09/12/2013)   Heart murmur    Systolic per Dr Orpah Cobb' notes   High cholesterol    History of blood transfusion  "a couple"   "related to low counts"   Hypertension    Iron deficiency anemia    "get epogen shots q month" (02/20/2014)   MCGN (minimal change glomerulonephritis)    "using chemo to tx;  finished my last tx in 12/2013"   Neuropathy    Peritoneal dialysis status    Stroke 08/15/2017   ruled out that it was not a stroke    PAST SURGICAL HISTORY: Past Surgical History:  Procedure Laterality Date   ABDOMINAL HYSTERECTOMY  2010   "laparoscopic"   ANKLE FRACTURE SURGERY Right 1994   AV FISTULA PLACEMENT Left 04/14/2016   Procedure: ARTERIOVENOUS (AV) FISTULA CREATION;  Surgeon: Chuck Hint, MD;  Location: [facility] OR;  Service: Vascular;  Laterality: Left;   AV FISTULA PLACEMENT Left 08/09/2017   Procedure: INSERTION OF ARTERIOVENOUS (AV) GORE-TEX GRAFT LEFT ARM;  Surgeon: Sherren Kerns, MD;  Location: [facility] OR;  Service: Vascular;  Laterality: Left;   AV FISTULA PLACEMENT Right 03/15/2020   Procedure: RIGHT ARM ARTERIOVENOUS (AV) FISTULA CREATION 1st Stage Basilic Transposition;  Surgeon: Nada Libman, MD;  Location: [facility] OR;  Service: Vascular;  Laterality: Right;   AV FISTULA PLACEMENT Right 02/24/2021   Procedure: RIGHT ARM ARTERIOVENOUS  FISTULA CREATION;  Surgeon: Victorino Sparrow, MD;  Location: [facility] OR;  Service: Vascular;  Laterality: Right;  regional block   AVGG REMOVAL Left 08/11/2017   Procedure: REMOVAL OF ARTERIOVENOUS GORETEX GRAFT (AVGG);  Surgeon: Nada Libman, MD;  Location: [facility] OR;  Service: Vascular;  Laterality: Left;   BASCILIC VEIN TRANSPOSITION Right 05/17/2020   Procedure: RIGHT UPPER EXTREMITY SECOND STAGE BASCILIC VEIN TRANSPOSITION;  Surgeon: Nada Libman, MD;  Location: [facility] OR;  Service: Vascular;  Laterality: Right;   BIOPSY  09/03/2017   Procedure: BIOPSY;  Surgeon: Kathi Der, MD;  Location: [facility] ENDOSCOPY;  Service: Gastroenterology;;   BIOPSY  12/24/2017   Procedure: BIOPSY;  Surgeon: Lemar Lofty., MD;  Location: [facility] ENDOSCOPY;  Service:  Gastroenterology;;   CARDIAC CATHETERIZATION  2000's   COLONOSCOPY WITH PROPOFOL N/A 09/04/2017   Procedure: COLONOSCOPY WITH PROPOFOL;  Surgeon: Kerin Salen, MD;  Location: [facility] ENDOSCOPY;  Service: Gastroenterology;  Laterality: N/A;   ENTEROSCOPY N/A 12/24/2017   Procedure: ENTEROSCOPY;  Surgeon: Meridee Score Netty Starring., MD;  Location: [facility] ENDOSCOPY;  Service: Gastroenterology;  Laterality: N/A;  ESOPHAGOGASTRODUODENOSCOPY     ESOPHAGOGASTRODUODENOSCOPY (EGD) WITH PROPOFOL Left 06/04/2017   Procedure: ESOPHAGOGASTRODUODENOSCOPY (EGD) WITH PROPOFOL;  Surgeon: Willis Modena, MD;  Location: [facility] ENDOSCOPY;  Service: Endoscopy;  Laterality: Left;   ESOPHAGOGASTRODUODENOSCOPY (EGD) WITH PROPOFOL N/A 09/03/2017   Procedure: ESOPHAGOGASTRODUODENOSCOPY (EGD) WITH PROPOFOL;  Surgeon: Kathi Der, MD;  Location: [facility] ENDOSCOPY;  Service: Gastroenterology;  Laterality: N/A;   FISTULA SUPERFICIALIZATION Left 04/02/2017   Procedure: FISTULA SUPERFICIALIZATION LEFT ARM;  Surgeon: Nada Libman, MD;  Location: [facility] OR;  Service: Vascular;  Laterality: Left;   FISTULA SUPERFICIALIZATION Right 04/14/2021   Procedure: RIGHT ARM FISTULA SUPERFICIALIZATION;  Surgeon: Victorino Sparrow, MD;  Location: [facility] OR;  Service: Vascular;  Laterality: Right;  PERIPHERAL NERVE BLOCK   FISTULOGRAM Left 04/07/2016   Procedure: FISTULOGRAM;  Surgeon: Maeola Harman, MD;  Location: [facility] OR;  Service: Vascular;  Laterality: Left;   FRACTURE SURGERY     right ankle   GIVENS CAPSULE STUDY N/A 10/29/2017   Procedure: GIVENS CAPSULE STUDY;  Surgeon: Charlott Rakes, MD;  Location: [facility] ENDOSCOPY;  Service: Endoscopy;  Laterality: N/A;   GIVENS CAPSULE STUDY N/A 12/24/2017   Procedure: GIVENS CAPSULE STUDY;  Surgeon: Lemar Lofty., MD;  Location: [facility] ENDOSCOPY;  Service: Gastroenterology;  Laterality: N/A;   GIVENS CAPSULE STUDY N/A 05/22/2018   Procedure: GIVENS CAPSULE STUDY;  Surgeon: Lemar Lofty., MD;   Location: [facility] ENDOSCOPY;  Service: Gastroenterology;  Laterality: N/A;   INSERTION OF DIALYSIS CATHETER Right 04/07/2016   Procedure: INSERTION OF DIALYSIS CATHETER;  Surgeon: Maeola Harman, MD;  Location: [facility] OR;  Service: Vascular;  Laterality: Right;   INSERTION OF DIALYSIS CATHETER Right 04/04/2017   Procedure: INSERTION OF DIALYSIS CATHETER;  Surgeon: Maeola Harman, MD;  Location: [facility] OR;  Service: Vascular;  Laterality: Right;   IR FLUORO GUIDE CV LINE LEFT  03/13/2020   IR US GUIDE VASC ACCESS LEFT  03/13/2020   LEFT HEART CATH AND CORONARY ANGIOGRAPHY N/A 05/12/2019   Procedure: LEFT HEART CATH AND CORONARY ANGIOGRAPHY;  Surgeon: Orpah Cobb, MD;  Location: [facility] INVASIVE CV LAB;  Service: Cardiovascular;  Laterality: N/A;   LIGATION OF ARTERIOVENOUS  FISTULA Left 04/14/2016   Procedure: LIGATION OF ARTERIOVENOUS  FISTULA;  Surgeon: Chuck Hint, MD;  Location: [facility] OR;  Service: Vascular;  Laterality: Left;   LIGATION OF COMPETING BRANCHES OF ARTERIOVENOUS FISTULA Right 05/17/2020   Procedure: LIGATION OF COMPETING BRANCHES OF RIGHT ARM ARTERIOVENOUS FISTULA;  Surgeon: Nada Libman, MD;  Location: [facility] OR;  Service: Vascular;  Laterality: Right;   PATCH ANGIOPLASTY Left 06/19/2016   Procedure: PATCH ANGIOPLASTY LEFT ARTERIOVENOUS FISTULA;  Surgeon: Chuck Hint, MD;  Location[facility] OR;  Service: Vascular;  Laterality: Left;   POLYPECTOMY  09/04/2017   Procedure: POLYPECTOMY;  Surgeon: Kerin Salen, MD;  Location: [facility] ENDOSCOPY;  Service: Gastroenterology;;   REVISON OF ARTERIOVENOUS FISTULA Left 06/19/2016   Procedure: REVISION SUPERFICIALIZATION OF BRACHIOCEPHALIC ARTERIOVENOUS FISTULA;  Surgeon: Chuck Hint, MD;  Location: [facility] OR;  Service: Vascular;  Laterality: Left;   UPPER EXTREMITY VENOGRAPHY Bilateral 02/19/2021   Procedure: UPPER EXTREMITY VENOGRAPHY;  Surgeon: Victorino Sparrow, MD;  Location: [facility] INVASIVE CV LAB;  Service: Cardiovascular;   Laterality: Bilateral;    FAMILY HISTORY: Family History  Problem Relation Age of Onset   Hypertension Mother    Thyroid disease Mother    Coronary artery disease Father    Hypertension Father    Diabetes Father    Colon cancer Maternal Aunt  Esophageal cancer Maternal Uncle    Inflammatory bowel disease Neg Hx    Liver disease Neg Hx    Pancreatic cancer Neg Hx    Rectal cancer Neg Hx    Stomach cancer Neg Hx     SOCIAL HISTORY: Social History   Socioeconomic History   Marital status: Single    Spouse name: Not on file   Number of children: 3   Years of education: 14   Highest education level: Associate degree: academic program  Occupational History   Occupation: disabled  Tobacco Use   Smoking status: Never   Smokeless tobacco: Never  Vaping Use   Vaping Use: Never used  Substance and Sexual Activity   Alcohol use: No   Drug use: Never   Sexual activity: Not Currently    Birth control/protection: Surgical  Other Topics Concern   Not on file  Social History Narrative   Not on file   Social Determinants of Health   Financial Resource Strain: Low Risk  (04/04/2018)   Overall Financial Resource Strain (CARDIA)    Difficulty of Paying Living Expenses: Not hard at all  Food Insecurity: Food Insecurity Present (04/04/2018)   Hunger Vital Sign    Worried About Running Out of Food in the Last Year: Sometimes true    Ran Out of Food in the Last Year: Never true  Transportation Needs: No Transportation Needs (04/04/2018)   PRAPARE - Administrator, Civil Service (Medical): No    Lack of Transportation (Non-Medical): No  Physical Activity: Inactive (04/04/2018)   Exercise Vital Sign    Days of Exercise per Week: 0 days    Minutes of Exercise per Session: 0 min  Stress: No Stress Concern Present (04/04/2018)   [employer] of Occupational Health - Occupational Stress Questionnaire    Feeling of Stress : Only a little  Social Connections: Somewhat  Isolated (04/04/2018)   Social Connection and Isolation Panel [NHANES]    Frequency of Communication with Friends and Family: More than three times a week    Frequency of Social Gatherings with Friends and Family: More than three times a week    Attends Religious Services: More than 4 times per year    Active Member of [employer] or Organizations: No    Attends Banker Meetings: Never    Marital Status: Never married  Intimate Partner Violence: Not At Risk (09/02/2017)   Humiliation, Afraid, Rape, and Kick questionnaire    Fear of Current or Ex-Partner: No    Emotionally Abused: No    Physically Abused: No    Sexually Abused: No      Levert Feinstein, M.D. Ph.D.  [facility] [address], [address] [place], [place] [address] Ph: [phone] Fax: [phone] + CC:  Rick Duff, PA-C [address] [place],  [place] [address]  Rick Duff, PA-C

## 2022-04-23 NOTE — Progress Notes (Signed)
Xeomin- 100 units x 1 vials Lot: [id number] Expiration: 12.2025 NDC: 0454-0981-19  Bacteriostatic 0.9% Sodium Chloride- 4 mL total Lot: [id number] Expiration: 11.25 NDC: 62130-865-78  Dx: G24.5 B/B Witnessed by Margaree Mackintosh

## 2022-04-24 ENCOUNTER — Encounter: Payer: Self-pay | Admitting: *Deleted

## 2022-04-27 ENCOUNTER — Ambulatory Visit: Payer: Medicare Other | Admitting: General Practice

## 2022-04-27 ENCOUNTER — Encounter
Admission: RE | Disposition: A | Discharge status: Home / Self Care | Payer: Self-pay | Source: Home / Self Care | Attending: Gastroenterology

## 2022-04-27 ENCOUNTER — Ambulatory Visit
Admission: RE | Admit: 2022-04-27 | Discharge: 2022-04-27 | Disposition: A | Discharge status: Home / Self Care | Payer: Medicare Other | Attending: Gastroenterology | Admitting: Gastroenterology

## 2022-04-27 ENCOUNTER — Ambulatory Visit: Payer: Medicare Other | Admitting: Neurology

## 2022-04-27 ENCOUNTER — Encounter: Payer: Self-pay | Admitting: *Deleted

## 2022-04-27 DIAGNOSIS — F419 Anxiety disorder, unspecified: Secondary | ICD-10-CM | POA: Diagnosis not present

## 2022-04-27 DIAGNOSIS — R1310 Dysphagia, unspecified: Secondary | ICD-10-CM | POA: Diagnosis not present

## 2022-04-27 DIAGNOSIS — K3184 Gastroparesis: Secondary | ICD-10-CM | POA: Diagnosis not present

## 2022-04-27 DIAGNOSIS — K3189 Other diseases of stomach and duodenum: Secondary | ICD-10-CM | POA: Insufficient documentation

## 2022-04-27 DIAGNOSIS — R112 Nausea with vomiting, unspecified: Secondary | ICD-10-CM | POA: Insufficient documentation

## 2022-04-27 DIAGNOSIS — K219 Gastro-esophageal reflux disease without esophagitis: Secondary | ICD-10-CM | POA: Diagnosis not present

## 2022-04-27 DIAGNOSIS — I509 Heart failure, unspecified: Secondary | ICD-10-CM | POA: Insufficient documentation

## 2022-04-27 DIAGNOSIS — N186 End stage renal disease: Secondary | ICD-10-CM | POA: Diagnosis not present

## 2022-04-27 DIAGNOSIS — K2960 Other gastritis without bleeding: Secondary | ICD-10-CM | POA: Diagnosis not present

## 2022-04-27 DIAGNOSIS — K449 Diaphragmatic hernia without obstruction or gangrene: Secondary | ICD-10-CM | POA: Diagnosis not present

## 2022-04-27 DIAGNOSIS — I132 Hypertensive heart and chronic kidney disease with heart failure and with stage 5 chronic kidney disease, or end stage renal disease: Secondary | ICD-10-CM | POA: Insufficient documentation

## 2022-04-27 DIAGNOSIS — Z79899 Other long term (current) drug therapy: Secondary | ICD-10-CM | POA: Insufficient documentation

## 2022-04-27 DIAGNOSIS — I471 Supraventricular tachycardia, unspecified: Secondary | ICD-10-CM | POA: Diagnosis not present

## 2022-04-27 DIAGNOSIS — E1143 Type 2 diabetes mellitus with diabetic autonomic (poly)neuropathy: Secondary | ICD-10-CM | POA: Diagnosis not present

## 2022-04-27 DIAGNOSIS — Z992 Dependence on renal dialysis: Secondary | ICD-10-CM | POA: Diagnosis not present

## 2022-04-27 DIAGNOSIS — E1122 Type 2 diabetes mellitus with diabetic chronic kidney disease: Secondary | ICD-10-CM | POA: Diagnosis not present

## 2022-04-27 HISTORY — PX: ESOPHAGOGASTRODUODENOSCOPY (EGD) WITH PROPOFOL: SHX5813

## 2022-04-27 SURGERY — ESOPHAGOGASTRODUODENOSCOPY (EGD) WITH PROPOFOL
Anesthesia: General

## 2022-04-27 MED ORDER — PROPOFOL 10 MG/ML IV BOLUS
INTRAVENOUS | Status: AC
  Filled 2022-04-27: qty 40

## 2022-04-27 MED ORDER — LIDOCAINE HCL (CARDIAC) PF 100 MG/5ML IV SOSY
PREFILLED_SYRINGE | INTRAVENOUS | Status: DC | PRN
  Administered 2022-04-27: 100 mg via INTRAVENOUS

## 2022-04-27 MED ORDER — PROPOFOL 10 MG/ML IV BOLUS
INTRAVENOUS | Status: DC | PRN
  Administered 2022-04-27 (×2): 20 mg via INTRAVENOUS
  Administered 2022-04-27: 30 mg via INTRAVENOUS
  Administered 2022-04-27: 20 mg via INTRAVENOUS
  Administered 2022-04-27: 120 mg via INTRAVENOUS
  Administered 2022-04-27: 20 mg via INTRAVENOUS

## 2022-04-27 MED ORDER — SODIUM CHLORIDE 0.9 % IV SOLN: INTRAVENOUS | Status: DC

## 2022-04-27 NOTE — Transfer of Care (Signed)
Immediate Anesthesia Transfer of Care Note  Patient: Patricia [name]  Procedure(s) Performed: ESOPHAGOGASTRODUODENOSCOPY (EGD) WITH PROPOFOL  Patient Location: Endoscopy Unit  Anesthesia Type:General  Level of Consciousness: drowsy  Airway & Oxygen Therapy: Patient Spontanous Breathing and Patient connected to face mask oxygen  Post-op Assessment: Report given to RN and Post -op Vital signs reviewed and stable  Post vital signs: Reviewed and stable  Last Vitals:  Vitals Value Taken Time  BP 126/75 04/27/22 1157  Temp 35.8 1157  Pulse 91 04/27/22 1158  Resp 22 04/27/22 1158  SpO2 98 % 04/27/22 1158  Vitals shown include unvalidated device data.  Last Pain:  Vitals:   04/27/22 1051  TempSrc: Temporal         Complications: No notable events documented.

## 2022-04-27 NOTE — Interval H&P Note (Signed)
History and Physical Interval Note:  04/27/2022 11:37 AM  Patricia [name]  has presented today for surgery, with the diagnosis of GERD Dysphagia N/V RUQ abd pain.  The various methods of treatment have been discussed with the patient and family. After consideration of risks, benefits and other options for treatment, the patient has consented to  Procedure(s): ESOPHAGOGASTRODUODENOSCOPY (EGD) WITH PROPOFOL (N/A) as a surgical intervention.  The patient's history has been reviewed, patient examined, no change in status, stable for surgery.  I have reviewed the patient's chart and labs.  Questions were answered to the patient's satisfaction.     Regis Bill  Ok to proceed with EGD

## 2022-04-27 NOTE — H&P (Signed)
Outpatient short stay form Pre-procedure 04/27/2022  Regis Bill, MD  Primary Physician: Rick Duff, PA-C  Reason for visit:  Nausea/Vomiting  History of present illness:    47 y/o lady with ESRD on dialysis (peritoneal), obesity, and hypertension here for EGD for dyspepsia, dysphagia, and nausea/vomiting. Was recently diagnosed with gastroparesis. No blood thinners. History of hysterectomy and surgery for peritoneal dialysis catheter placement.    Current Facility-Administered Medications:    0.9 %  sodium chloride infusion, , Intravenous, Continuous, Darnise Montag, Rossie Muskrat, MD, Last Rate: 20 mL/hr at 04/27/22 1128, Continued from Pre-op at 04/27/22 1128  Facility-Administered Medications Prior to Admission  Medication Dose Route Frequency Provider Last Rate Last Admin   incobotulinumtoxinA (XEOMIN) 100 units injection 100 Units  100 Units Intramuscular Once Levert Feinstein, MD       Medications Prior to Admission  Medication Sig Dispense Refill Last Dose   ALPRAZolam (XANAX) 1 MG tablet Take 1 mg by mouth 2 (two) times daily as needed for anxiety or sleep.   04/26/2022   carbidopa-levodopa (SINEMET IR) 25-100 MG tablet TAKE 1 TABLET BY MOUTH THREE TIMES DAILY 90 tablet 0 04/26/2022   DULoxetine (CYMBALTA) 30 MG capsule Take 1 capsule (30 mg total) by mouth daily. 30 capsule 11 04/26/2022   gabapentin (NEURONTIN) 100 MG capsule Take 3 capsules (300 mg total) by mouth 3 (three) times daily. 270 capsule 11 04/26/2022   incobotulinumtoxinA (XEOMIN) 50 units SOLR injection Inject 50 Units into the muscle every 3 (three) months. 1 each 3 Past Week   metoprolol succinate (TOPROL-XL) 50 MG 24 hr tablet Take 50 mg by mouth every evening. Take with or immediately following a meal.   04/26/2022   omeprazole (PRILOSEC) 40 MG capsule Take 40 mg by mouth in the morning and at bedtime.   04/26/2022   rOPINIRole (REQUIP) 1 MG tablet Take 2 tablets (2 mg total) by mouth 3 (three) times daily. 60  tablet 6 04/26/2022   zolpidem (AMBIEN) 5 MG tablet Take 5 mg by mouth at bedtime.   04/26/2022   acetaminophen (TYLENOL) 500 MG tablet Take 1,000 mg by mouth every 6 (six) hours as needed for moderate pain.      allopurinol (ZYLOPRIM) 100 MG tablet Take 100 mg by mouth at bedtime.       Diclofenac Sodium 1 % CREA 1 gram qid prn 120 g 11    ezetimibe (ZETIA) 10 MG tablet Take 10 mg by mouth daily.      hydrOXYzine (ATARAX/VISTARIL) 25 MG tablet Take 25 mg by mouth every 6 (six) hours as needed for itching.      lanthanum (FOSRENOL) 1000 MG chewable tablet Chew 1,000-3,000 mg by mouth See admin instructions. Take 3000 mg with each meal and 1000-2000 mg with each snack      lidocaine-prilocaine (EMLA) cream 1gram tid prn 30 g 11    multivitamin (RENA-VIT) TABS tablet Take 1 tablet by mouth at bedtime.       traMADol (ULTRAM) 50 MG tablet Take 1 tablet (50 mg total) by mouth every 6 (six) hours as needed for moderate pain. (Patient not taking: Reported on 04/27/2022) 20 tablet 0 Not Taking   vortioxetine HBr (TRINTELLIX) 5 MG TABS tablet Take 5 mg by mouth daily.        Allergies  Allergen Reactions   Nsaids Other (See Comments)    Cannot take due to Kidney disease/Kidney function    Tolmetin Other (See Comments)    Cannot take due  to Kidney Disease     Past Medical History:  Diagnosis Date   Acid reflux    Anemia 04/2019   REQUIRING BLOOD TRANSFUSIONS   Anxiety    Arthritis    CHF (congestive heart failure)    Dyspnea    with exertion   Dysrhythmia    tachycardia   ESRD (end stage renal disease)    [facility]   Facial twitching 11/2020   mouth twitch, eyes roll back, neurology started patient on low dose   Sinemet IR.   GI bleeding 12/2017   Gout    Headache(784.0)    "related to chemo; sometimes weekly" (09/12/2013)   Heart murmur    Systolic per Dr Orpah Cobb' notes   High cholesterol    History of blood transfusion "a couple"   "related to low counts"    Hypertension    Iron deficiency anemia    "get epogen shots q month" (02/20/2014)   MCGN (minimal change glomerulonephritis)    "using chemo to tx;  finished my last tx in 12/2013"   Neuropathy    Peritoneal dialysis status    Stroke 08/15/2017   ruled out that it was not a stroke    Review of systems:  Otherwise negative.    Physical Exam  Gen: Alert, oriented. Appears stated age.  HEENT: PERRLA. Lungs: No respiratory distress CV: RRR Abd: soft, benign, no masses Ext: No edema    Planned procedures: Proceed with EGD. The patient understands the nature of the planned procedure, indications, risks, alternatives and potential complications including but not limited to bleeding, infection, perforation, damage to internal organs and possible oversedation/side effects from anesthesia. The patient agrees and gives consent to proceed.  Please refer to procedure notes for findings, recommendations and patient disposition/instructions.     Regis Bill, MD [facility] Gastroenterology

## 2022-04-27 NOTE — Anesthesia Postprocedure Evaluation (Signed)
Anesthesia Post Note  Patient: Patricia [name]  Procedure(s) Performed: ESOPHAGOGASTRODUODENOSCOPY (EGD) WITH PROPOFOL  Patient location during evaluation: Endoscopy Anesthesia Type: General Level of consciousness: awake and alert Pain management: pain level controlled Vital Signs Assessment: post-procedure vital signs reviewed and stable Respiratory status: spontaneous breathing, nonlabored ventilation, respiratory function stable and patient connected to nasal cannula oxygen Cardiovascular status: blood pressure returned to baseline and stable Postop Assessment: no apparent nausea or vomiting Anesthetic complications: no  No notable events documented.   Last Vitals:  Vitals:   04/27/22 1053 04/27/22 1158  BP: (!) 168/89 126/75  Pulse: 85 92  Resp:  (!) 24  Temp:  (!) 36.1 C  SpO2: 98% 98%    Last Pain:  Vitals:   04/27/22 1158  TempSrc:   PainSc: Asleep                 Stephanie Coup

## 2022-04-27 NOTE — Anesthesia Preprocedure Evaluation (Signed)
Anesthesia Evaluation  Patient identified by MRN, date of birth, ID band Patient awake    Reviewed: Allergy & Precautions, NPO status , Patient's Chart, lab work & pertinent test results, reviewed documented beta blocker date and time   History of Anesthesia Complications Negative for: history of anesthetic complications  Airway Mallampati: III  TM Distance: >3 FB Neck ROM: Full    Dental no notable dental hx. (+) Dental Advisory Given   Pulmonary neg pulmonary ROS, neg shortness of breath, neg sleep apnea   Pulmonary exam normal        Cardiovascular hypertension, Pt. on home beta blockers and Pt. on medications +CHF  Normal cardiovascular exam+ dysrhythmias Supra Ventricular Tachycardia    '21 TTE - EF 55 to 60%. Grade I diastolic dysfunction (impaired relaxation).  Trivial MR    Neuro/Psych  Headaches PSYCHIATRIC DISORDERS Anxiety      Neuromuscular disease  negative psych ROS   GI/Hepatic Neg liver ROS,GERD  Medicated,,  Endo/Other  diabetes  Morbid obesity Obesity   Renal/GU ESRF and DialysisRenal disease  negative genitourinary   Musculoskeletal  (+) Arthritis ,    Abdominal   Peds negative pediatric ROS (+)  Hematology  (+) Blood dyscrasia, anemia   Anesthesia Other Findings   Reproductive/Obstetrics negative OB ROS                             Anesthesia Physical Anesthesia Plan  ASA: 3  Anesthesia Plan: Regional and MAC   Post-op Pain Management: Regional block* and Tylenol PO (pre-op)*   Induction: Intravenous  PONV Risk Score and Plan: 2 and Propofol infusion and Ondansetron  Airway Management Planned: Nasal Cannula  Additional Equipment: None  Intra-op Plan:   Post-operative Plan:   Informed Consent: I have reviewed the patients History and Physical, chart, labs and discussed the procedure including the risks, benefits and alternatives for the proposed  anesthesia with the patient or authorized representative who has indicated his/her understanding and acceptance.     Dental advisory given  Plan Discussed with: Anesthesiologist and CRNA  Anesthesia Plan Comments: (Discussed risks of anesthesia with patient, including possibility of difficulty with spontaneous ventilation under anesthesia necessitating airway intervention, PONV, and rare risks such as cardiac or respiratory or neurological events, and allergic reactions. Discussed the role of CRNA in patient's perioperative care. Patient understands.)        Anesthesia Quick Evaluation

## 2022-04-27 NOTE — Op Note (Addendum)
[facility] Gastroenterology [name] Procedure Date: 04/27/2022 11:27 AM MRN: [id number] Account #: [id number] Date of Birth: 1975/06/14 Admit Type: Outpatient Age: 47 Room: [facility] ENDO ROOM 3 Gender: Female Note Status: Finalized Instrument Name: Laurette Schimke [id number] Procedure:             Upper GI endoscopy Indications:           Dysphagia, Nausea with vomiting Providers:             Eather Colas MD, MD Medicines:             Monitored Anesthesia Care Complications:         No immediate complications. Estimated blood loss:                         Minimal. Procedure:             Pre-Anesthesia Assessment:                        - Prior to the procedure, a History and Physical was                         performed, and patient medications and allergies were                         reviewed. The patient is competent. The risks and                         benefits of the procedure and the sedation options and                         risks were discussed with the patient. All questions                         were answered and informed consent was obtained.                         Patient identification and proposed procedure were                         verified by the physician, the nurse, the                         anesthesiologist, the anesthetist and the technician                         in the endoscopy suite. Mental Status Examination:                         alert and oriented. Airway Examination: normal                         oropharyngeal airway and neck mobility. Respiratory                         Examination: clear to auscultation. CV Examination:                         normal. Prophylactic Antibiotics: The patient does not  require prophylactic antibiotics. Prior                         Anticoagulants: The patient has taken no anticoagulant                         or antiplatelet agents. ASA Grade  Assessment: III - A                         patient with severe systemic disease. After reviewing                         the risks and benefits, the patient was deemed in                         satisfactory condition to undergo the procedure. The                         anesthesia plan was to use monitored anesthesia care                         (MAC). Immediately prior to administration of                         medications, the patient was re-assessed for adequacy                         to receive sedatives. The heart rate, respiratory                         rate, oxygen saturations, blood pressure, adequacy of                         pulmonary ventilation, and response to care were                         monitored throughout the procedure. The physical                         status of the patient was re-assessed after the                         procedure.                        After obtaining informed consent, the endoscope was                         passed under direct vision. Throughout the procedure,                         the patient's blood pressure, pulse, and oxygen                         saturations were monitored continuously. The Endoscope                         was introduced through the mouth, and advanced to the  second part of duodenum. The upper GI endoscopy was                         accomplished without difficulty. The patient tolerated                         the procedure well. Findings:      A 2 cm hiatal hernia was present.      The exam of the esophagus was otherwise normal.      Patchy mild mucosal changes characterized by granularity and altered       texture were found in the gastric fundus and in the gastric body. A       polypoid appearing lesion was removed with a cold snare. Resection and       retrieval were complete. Estimated blood loss was minimal.      Biopsies were taken with a cold forceps in the stomach for  Helicobacter       pylori testing. Estimated blood loss was minimal.      The examined duodenum was normal. Impression:            - 2 cm hiatal hernia.                        - Granular and texture changed mucosa in the gastric                         fundus and gastric body. Polypoid lesion that's                         representative of the abnormal mucosa was resected.                        - Normal examined duodenum.                        - Biopsies were taken with a cold forceps for                         Helicobacter pylori testing. Recommendation:        - Discharge patient to home.                        - Resume previous diet.                        - Continue present medications.                        - Await pathology results.                        - Return to referring physician as previously                         scheduled. Procedure Code(s):     --- Professional ---                        [phone], Esophagogastroduodenoscopy, flexible,  transoral; with removal of tumor(s), polyp(s), or                         other lesion(s) by snare technique Diagnosis Code(s):     --- Professional ---                        K44.9, Diaphragmatic hernia without obstruction or                         gangrene                        K31.89, Other diseases of stomach and duodenum                        R13.10, Dysphagia, unspecified                        R11.2, Nausea with vomiting, unspecified CPT copyright 2022 [employer]. All rights reserved. The codes documented in this report are preliminary and upon coder review may  be revised to meet current compliance requirements. Eather Colas MD, MD 04/27/2022 11:58:12 AM Number of Addenda: 0 Note Initiated On: 04/27/2022 11:27 AM Estimated Blood Loss:  Estimated blood loss was minimal.      [facility]

## 2022-04-29 ENCOUNTER — Encounter: Payer: Self-pay | Admitting: Gastroenterology

## 2022-04-29 DIAGNOSIS — G245 Blepharospasm: Secondary | ICD-10-CM | POA: Diagnosis not present

## 2022-04-29 LAB — SURGICAL PATHOLOGY

## 2022-04-29 MED ORDER — ROPINIROLE HCL 1 MG PO TABS: 1.0000 mg | ORAL_TABLET | Freq: Every day | ORAL | 6 refills | Status: DC

## 2022-05-05 ENCOUNTER — Other Ambulatory Visit: Payer: Self-pay | Admitting: Neurology

## 2022-05-06 ENCOUNTER — Other Ambulatory Visit: Payer: Self-pay

## 2022-05-06 ENCOUNTER — Other Ambulatory Visit: Payer: Self-pay | Admitting: Neurology

## 2022-05-06 MED ORDER — DULOXETINE HCL 30 MG PO CPEP: 30.0000 mg | ORAL_CAPSULE | Freq: Every day | ORAL | 3 refills | Status: DC

## 2022-05-07 ENCOUNTER — Other Ambulatory Visit (HOSPITAL_COMMUNITY): Payer: Self-pay

## 2022-05-11 ENCOUNTER — Inpatient Hospital Stay
Admission: EM | Admit: 2022-05-11 | Discharge: 2022-05-13 | DRG: 682 | Disposition: A | Discharge status: Home / Self Care | Payer: Medicare Other | Attending: Student | Admitting: Student

## 2022-05-11 ENCOUNTER — Other Ambulatory Visit: Payer: Self-pay

## 2022-05-11 ENCOUNTER — Emergency Department: Payer: Medicare Other

## 2022-05-11 ENCOUNTER — Encounter: Payer: Self-pay | Admitting: Emergency Medicine

## 2022-05-11 DIAGNOSIS — F419 Anxiety disorder, unspecified: Secondary | ICD-10-CM | POA: Diagnosis present

## 2022-05-11 DIAGNOSIS — Z8249 Family history of ischemic heart disease and other diseases of the circulatory system: Secondary | ICD-10-CM | POA: Diagnosis not present

## 2022-05-11 DIAGNOSIS — G629 Polyneuropathy, unspecified: Secondary | ICD-10-CM | POA: Diagnosis present

## 2022-05-11 DIAGNOSIS — G20A1 Parkinson's disease without dyskinesia, without mention of fluctuations: Secondary | ICD-10-CM | POA: Diagnosis present

## 2022-05-11 DIAGNOSIS — E7800 Pure hypercholesterolemia, unspecified: Secondary | ICD-10-CM | POA: Diagnosis present

## 2022-05-11 DIAGNOSIS — I160 Hypertensive urgency: Secondary | ICD-10-CM | POA: Diagnosis present

## 2022-05-11 DIAGNOSIS — E669 Obesity, unspecified: Secondary | ICD-10-CM | POA: Diagnosis present

## 2022-05-11 DIAGNOSIS — I120 Hypertensive chronic kidney disease with stage 5 chronic kidney disease or end stage renal disease: Secondary | ICD-10-CM | POA: Diagnosis present

## 2022-05-11 DIAGNOSIS — R0603 Acute respiratory distress: Principal | ICD-10-CM

## 2022-05-11 DIAGNOSIS — Z886 Allergy status to analgesic agent status: Secondary | ICD-10-CM

## 2022-05-11 DIAGNOSIS — E8770 Fluid overload, unspecified: Secondary | ICD-10-CM

## 2022-05-11 DIAGNOSIS — N2581 Secondary hyperparathyroidism of renal origin: Secondary | ICD-10-CM | POA: Diagnosis present

## 2022-05-11 DIAGNOSIS — Z992 Dependence on renal dialysis: Secondary | ICD-10-CM

## 2022-05-11 DIAGNOSIS — M109 Gout, unspecified: Secondary | ICD-10-CM | POA: Diagnosis present

## 2022-05-11 DIAGNOSIS — M1990 Unspecified osteoarthritis, unspecified site: Secondary | ICD-10-CM | POA: Diagnosis present

## 2022-05-11 DIAGNOSIS — D631 Anemia in chronic kidney disease: Secondary | ICD-10-CM | POA: Diagnosis present

## 2022-05-11 DIAGNOSIS — K219 Gastro-esophageal reflux disease without esophagitis: Secondary | ICD-10-CM | POA: Diagnosis present

## 2022-05-11 DIAGNOSIS — D649 Anemia, unspecified: Secondary | ICD-10-CM | POA: Diagnosis not present

## 2022-05-11 DIAGNOSIS — F32A Depression, unspecified: Secondary | ICD-10-CM | POA: Diagnosis present

## 2022-05-11 DIAGNOSIS — Z888 Allergy status to other drugs, medicaments and biological substances status: Secondary | ICD-10-CM | POA: Diagnosis not present

## 2022-05-11 DIAGNOSIS — N186 End stage renal disease: Secondary | ICD-10-CM | POA: Diagnosis present

## 2022-05-11 DIAGNOSIS — J9601 Acute respiratory failure with hypoxia: Secondary | ICD-10-CM | POA: Diagnosis present

## 2022-05-11 DIAGNOSIS — Z79899 Other long term (current) drug therapy: Secondary | ICD-10-CM | POA: Diagnosis not present

## 2022-05-11 DIAGNOSIS — N289 Disorder of kidney and ureter, unspecified: Secondary | ICD-10-CM

## 2022-05-11 DIAGNOSIS — E875 Hyperkalemia: Secondary | ICD-10-CM | POA: Diagnosis present

## 2022-05-11 DIAGNOSIS — Z6839 Body mass index (BMI) 39.0-39.9, adult: Secondary | ICD-10-CM

## 2022-05-11 DIAGNOSIS — Z9221 Personal history of antineoplastic chemotherapy: Secondary | ICD-10-CM

## 2022-05-11 DIAGNOSIS — Z90710 Acquired absence of both cervix and uterus: Secondary | ICD-10-CM | POA: Diagnosis not present

## 2022-05-11 LAB — BASIC METABOLIC PANEL
Anion gap: 16 — ABNORMAL HIGH (ref 5–15)
Anion gap: 15 (ref 5–15)
Anion gap: 14 (ref 5–15)
BUN: 100 mg/dL — ABNORMAL HIGH (ref 6–20)
BUN: 90 mg/dL — ABNORMAL HIGH (ref 6–20)
BUN: 48 mg/dL — ABNORMAL HIGH (ref 6–20)
CO2: 24 mmol/L (ref 22–32)
CO2: 25 mmol/L (ref 22–32)
CO2: 29 mmol/L (ref 22–32)
Calcium: 8.7 mg/dL — ABNORMAL LOW (ref 8.9–10.3)
Calcium: 8.5 mg/dL — ABNORMAL LOW (ref 8.9–10.3)
Calcium: 8.4 mg/dL — ABNORMAL LOW (ref 8.9–10.3)
Chloride: 97 mmol/L — ABNORMAL LOW (ref 98–111)
Chloride: 97 mmol/L — ABNORMAL LOW (ref 98–111)
Chloride: 91 mmol/L — ABNORMAL LOW (ref 98–111)
Creatinine, Ser: 12.72 mg/dL — ABNORMAL HIGH (ref 0.44–1.00)
Creatinine, Ser: 12.67 mg/dL — ABNORMAL HIGH (ref 0.44–1.00)
Creatinine, Ser: 7.66 mg/dL — ABNORMAL HIGH (ref 0.44–1.00)
GFR, Estimated: 3 mL/min — ABNORMAL LOW (ref >60)
GFR, Estimated: 3 mL/min — ABNORMAL LOW (ref >60)
GFR, Estimated: 6 mL/min — ABNORMAL LOW (ref >60)
Glucose, Bld: 106 mg/dL — ABNORMAL HIGH (ref 70–99)
Glucose, Bld: 136 mg/dL — ABNORMAL HIGH (ref 70–99)
Glucose, Bld: 105 mg/dL — ABNORMAL HIGH (ref 70–99)
Potassium: 5.7 mmol/L — ABNORMAL HIGH (ref 3.5–5.1)
Potassium: 6.6 mmol/L (ref 3.5–5.1)
Potassium: 4.2 mmol/L (ref 3.5–5.1)
Sodium: 137 mmol/L (ref 135–145)
Sodium: 137 mmol/L (ref 135–145)
Sodium: 134 mmol/L — ABNORMAL LOW (ref 135–145)

## 2022-05-11 LAB — TROPONIN I (HIGH SENSITIVITY)
Troponin I (High Sensitivity): 10 ng/L (ref <18)
Troponin I (High Sensitivity): 11 ng/L (ref <18)

## 2022-05-11 LAB — URIC ACID: Uric Acid, Serum: 3.4 mg/dL (ref 2.5–7.1)

## 2022-05-11 LAB — RETICULOCYTES
Immature Retic Fract: 18.7 % — ABNORMAL HIGH (ref 2.3–15.9)
RBC.: 2.98 MIL/uL — ABNORMAL LOW (ref 3.87–5.11)
Retic Count, Absolute: 46.2 10*3/uL (ref 19.0–186.0)
Retic Ct Pct: 1.6 % (ref 0.4–3.1)

## 2022-05-11 LAB — CBC
HCT: 24.9 % — ABNORMAL LOW (ref 36.0–46.0)
HCT: 24.7 % — ABNORMAL LOW (ref 36.0–46.0)
Hemoglobin: 7.8 g/dL — ABNORMAL LOW (ref 12.0–15.0)
Hemoglobin: 7.5 g/dL — ABNORMAL LOW (ref 12.0–15.0)
MCH: 26 pg (ref 26.0–34.0)
MCH: 25.4 pg — ABNORMAL LOW (ref 26.0–34.0)
MCHC: 31.3 g/dL (ref 30.0–36.0)
MCHC: 30.4 g/dL (ref 30.0–36.0)
MCV: 83 fL (ref 80.0–100.0)
MCV: 83.7 fL (ref 80.0–100.0)
Platelets: 232 10*3/uL (ref 150–400)
Platelets: 244 10*3/uL (ref 150–400)
RBC: 3 MIL/uL — ABNORMAL LOW (ref 3.87–5.11)
RBC: 2.95 MIL/uL — ABNORMAL LOW (ref 3.87–5.11)
RDW: 16 % — ABNORMAL HIGH (ref 11.5–15.5)
RDW: 16 % — ABNORMAL HIGH (ref 11.5–15.5)
WBC: 7.4 10*3/uL (ref 4.0–10.5)
WBC: 7.5 10*3/uL (ref 4.0–10.5)
nRBC: 0 % (ref 0.0–0.2)
nRBC: 0 % (ref 0.0–0.2)

## 2022-05-11 LAB — IRON AND TIBC
Iron: 71 ug/dL (ref 28–170)
Saturation Ratios: 26 % (ref 10.4–31.8)
TIBC: 269 ug/dL (ref 250–450)
UIBC: 198 ug/dL

## 2022-05-11 LAB — BLOOD GAS, VENOUS
Acid-Base Excess: 2.3 mmol/L — ABNORMAL HIGH (ref 0.0–2.0)
Bicarbonate: 29.5 mmol/L — ABNORMAL HIGH (ref 20.0–28.0)

## 2022-05-11 LAB — FOLATE: Folate: >40 ng/mL (ref >5.9)

## 2022-05-11 LAB — HEPATITIS B SURFACE ANTIGEN: Hepatitis B Surface Ag: NONREACTIVE

## 2022-05-11 LAB — GLUCOSE, CAPILLARY: Glucose-Capillary: 75 mg/dL (ref 70–99)

## 2022-05-11 LAB — MRSA NEXT GEN BY PCR, NASAL: MRSA by PCR Next Gen: NOT DETECTED

## 2022-05-11 LAB — PHOSPHORUS: Phosphorus: 6.8 mg/dL — ABNORMAL HIGH (ref 2.5–4.6)

## 2022-05-11 LAB — BRAIN NATRIURETIC PEPTIDE: B Natriuretic Peptide: 624.9 pg/mL — ABNORMAL HIGH (ref 0.0–100.0)

## 2022-05-11 LAB — VITAMIN B12: Vitamin B-12: 1851 pg/mL — ABNORMAL HIGH (ref 180–914)

## 2022-05-11 LAB — FERRITIN: Ferritin: 911 ng/mL — ABNORMAL HIGH (ref 11–307)

## 2022-05-11 LAB — PROCALCITONIN: Procalcitonin: 0.19 ng/mL

## 2022-05-11 MED ORDER — CARBIDOPA-LEVODOPA 25-100 MG PO TABS
1.0000 | ORAL_TABLET | Freq: Three times a day (TID) | ORAL | Status: DC
  Administered 2022-05-11 – 2022-05-13 (×7): 1 via ORAL
  Filled 2022-05-11 (×8): qty 1

## 2022-05-11 MED ORDER — SODIUM ZIRCONIUM CYCLOSILICATE 10 G PO PACK
10.0000 g | PACK | Freq: Once | ORAL | Status: DC
  Filled 2022-05-11: qty 1

## 2022-05-11 MED ORDER — NITROGLYCERIN IN D5W 200-5 MCG/ML-% IV SOLN
0.0000 ug/min | INTRAVENOUS | Status: DC
  Administered 2022-05-11: 60 ug/min via INTRAVENOUS
  Administered 2022-05-11: 30 ug/min via INTRAVENOUS
  Filled 2022-05-11: qty 250

## 2022-05-11 MED ORDER — HYDROXYZINE HCL 25 MG PO TABS
25.0000 mg | ORAL_TABLET | Freq: Three times a day (TID) | ORAL | Status: DC | PRN
  Administered 2022-05-11 – 2022-05-13 (×5): 25 mg via ORAL
  Filled 2022-05-11 (×5): qty 1

## 2022-05-11 MED ORDER — INSULIN ASPART 100 UNIT/ML IV SOLN
5.0000 [IU] | Freq: Once | INTRAVENOUS | Status: DC
  Filled 2022-05-11: qty 0.05

## 2022-05-11 MED ORDER — DULOXETINE HCL 30 MG PO CPEP
30.0000 mg | ORAL_CAPSULE | Freq: Every day | ORAL | Status: DC
  Administered 2022-05-11 – 2022-05-13 (×3): 30 mg via ORAL
  Filled 2022-05-11 (×3): qty 1

## 2022-05-11 MED ORDER — HYDROMORPHONE HCL 1 MG/ML IJ SOLN
0.5000 mg | INTRAMUSCULAR | Status: DC | PRN
  Administered 2022-05-11 – 2022-05-12 (×3): 0.5 mg via INTRAVENOUS
  Filled 2022-05-11 (×3): qty 1

## 2022-05-11 MED ORDER — SODIUM CHLORIDE 0.9 % IV SOLN: 250.0000 mL | INTRAVENOUS | Status: DC | PRN

## 2022-05-11 MED ORDER — METOPROLOL SUCCINATE ER 50 MG PO TB24
50.0000 mg | ORAL_TABLET | Freq: Every day | ORAL | Status: DC
  Administered 2022-05-11 – 2022-05-13 (×3): 50 mg via ORAL
  Filled 2022-05-11 (×3): qty 1

## 2022-05-11 MED ORDER — ONDANSETRON HCL 4 MG/2ML IJ SOLN: 4.0000 mg | Freq: Four times a day (QID) | INTRAMUSCULAR | Status: DC | PRN

## 2022-05-11 MED ORDER — ISOSORBIDE MONONITRATE ER 30 MG PO TB24
60.0000 mg | ORAL_TABLET | Freq: Every evening | ORAL | Status: DC
  Administered 2022-05-11 – 2022-05-12 (×2): 60 mg via ORAL
  Filled 2022-05-11 (×2): qty 2

## 2022-05-11 MED ORDER — MORPHINE SULFATE (PF) 4 MG/ML IV SOLN
4.0000 mg | Freq: Once | INTRAVENOUS | Status: AC
  Administered 2022-05-11: 4 mg via INTRAVENOUS
  Filled 2022-05-11: qty 1

## 2022-05-11 MED ORDER — RENA-VITE PO TABS
1.0000 | ORAL_TABLET | Freq: Once | ORAL | Status: AC
  Administered 2022-05-11: 1 via ORAL
  Filled 2022-05-11: qty 1

## 2022-05-11 MED ORDER — SODIUM CHLORIDE 0.9% FLUSH
3.0000 mL | Freq: Two times a day (BID) | INTRAVENOUS | Status: DC
  Administered 2022-05-11 – 2022-05-12 (×2): 3 mL via INTRAVENOUS

## 2022-05-11 MED ORDER — ORAL CARE MOUTH RINSE: 15.0000 mL | OROMUCOSAL | Status: DC | PRN

## 2022-05-11 MED ORDER — ZOLPIDEM TARTRATE 5 MG PO TABS
5.0000 mg | ORAL_TABLET | Freq: Once | ORAL | Status: AC
  Administered 2022-05-11: 5 mg via ORAL
  Filled 2022-05-11: qty 1

## 2022-05-11 MED ORDER — ORAL CARE MOUTH RINSE
15.0000 mL | OROMUCOSAL | Status: DC
  Administered 2022-05-11 – 2022-05-12 (×2): 15 mL via OROMUCOSAL

## 2022-05-11 MED ORDER — GABAPENTIN 100 MG PO CAPS
100.0000 mg | ORAL_CAPSULE | Freq: Once | ORAL | Status: AC
  Administered 2022-05-11: 100 mg via ORAL
  Filled 2022-05-11: qty 1

## 2022-05-11 MED ORDER — CHLORHEXIDINE GLUCONATE CLOTH 2 % EX PADS
6.0000 | MEDICATED_PAD | Freq: Every day | CUTANEOUS | Status: DC
  Administered 2022-05-12: 6 via TOPICAL

## 2022-05-11 MED ORDER — ALBUTEROL SULFATE (2.5 MG/3ML) 0.083% IN NEBU: 10.0000 mg | INHALATION_SOLUTION | Freq: Once | RESPIRATORY_TRACT | Status: DC

## 2022-05-11 MED ORDER — ONDANSETRON HCL 4 MG/2ML IJ SOLN
4.0000 mg | Freq: Once | INTRAMUSCULAR | Status: AC
  Administered 2022-05-11: 4 mg via INTRAVENOUS
  Filled 2022-05-11: qty 2

## 2022-05-11 MED ORDER — PANTOPRAZOLE SODIUM 40 MG PO TBEC
40.0000 mg | DELAYED_RELEASE_TABLET | Freq: Once | ORAL | Status: AC
  Administered 2022-05-11: 40 mg via ORAL
  Filled 2022-05-11: qty 1

## 2022-05-11 MED ORDER — SODIUM BICARBONATE 8.4 % IV SOLN: 50.0000 meq | Freq: Once | INTRAVENOUS | Status: DC

## 2022-05-11 MED ORDER — SODIUM CHLORIDE 0.9% FLUSH
3.0000 mL | Freq: Two times a day (BID) | INTRAVENOUS | Status: DC
  Administered 2022-05-11 – 2022-05-12 (×4): 3 mL via INTRAVENOUS

## 2022-05-11 MED ORDER — ACETAMINOPHEN 325 MG PO TABS
650.0000 mg | ORAL_TABLET | Freq: Four times a day (QID) | ORAL | Status: DC | PRN
  Administered 2022-05-11 – 2022-05-13 (×3): 650 mg via ORAL
  Filled 2022-05-11 (×3): qty 2

## 2022-05-11 MED ORDER — ONDANSETRON HCL 4 MG PO TABS: 4.0000 mg | ORAL_TABLET | Freq: Four times a day (QID) | ORAL | Status: DC | PRN

## 2022-05-11 MED ORDER — SODIUM CHLORIDE 0.9% FLUSH: 3.0000 mL | INTRAVENOUS | Status: DC | PRN

## 2022-05-11 MED ORDER — NITROGLYCERIN IN D5W 200-5 MCG/ML-% IV SOLN
0.0000 ug/min | INTRAVENOUS | Status: DC
  Administered 2022-05-11: 5 ug/min via INTRAVENOUS
  Filled 2022-05-11: qty 250

## 2022-05-11 MED ORDER — FUROSEMIDE 10 MG/ML IJ SOLN
80.0000 mg | Freq: Once | INTRAMUSCULAR | Status: AC
  Administered 2022-05-11: 80 mg via INTRAVENOUS
  Filled 2022-05-11: qty 8

## 2022-05-11 MED ORDER — ALPRAZOLAM 0.5 MG PO TABS
1.0000 mg | ORAL_TABLET | Freq: Two times a day (BID) | ORAL | Status: DC | PRN
  Administered 2022-05-11 – 2022-05-13 (×4): 1 mg via ORAL
  Filled 2022-05-11 (×4): qty 2

## 2022-05-11 MED ORDER — HEPARIN SODIUM (PORCINE) 5000 UNIT/ML IJ SOLN
5000.0000 [IU] | Freq: Three times a day (TID) | INTRAMUSCULAR | Status: DC
  Administered 2022-05-11 – 2022-05-13 (×7): 5000 [IU] via SUBCUTANEOUS
  Filled 2022-05-11 (×7): qty 1

## 2022-05-11 MED ORDER — CALCIUM GLUCONATE-NACL 1-0.675 GM/50ML-% IV SOLN
1.0000 g | Freq: Once | INTRAVENOUS | Status: DC
  Filled 2022-05-11: qty 50

## 2022-05-11 MED ORDER — ACETAMINOPHEN 650 MG RE SUPP: 650.0000 mg | Freq: Four times a day (QID) | RECTAL | Status: DC | PRN

## 2022-05-11 MED ORDER — DEXTROSE 50 % IV SOLN: 1.0000 | Freq: Once | INTRAVENOUS | Status: DC

## 2022-05-11 NOTE — ED Notes (Signed)
Report was attempted to ICU.  Number was taken for them to call back.

## 2022-05-11 NOTE — ED Triage Notes (Signed)
Pt arrived via POV with reports of chest pain worse with coughing and shortness of breath, sxs worse tonight, pt states she goes to dialysis MWF last tx was Wed due to access bleeding and pt had to get the balloon fixed per family.  Pt unable to speak in complete sentences, audible wheezing.

## 2022-05-11 NOTE — ED Provider Notes (Addendum)
[facility] Provider Note    Event Date/Time   First MD Initiated Contact with Patient 05/11/22 0050     (approximate)   History   Shortness of Breath, Chest Pain, and Cough   HPI  Patricia [name] is a 47 y.o. female   Past medical history of dialysis Monday Wednesday Friday who presents emergency department in respiratory distress after missing her Friday hemodialysis session.  She feels short of breath and has facial swelling chest tightness which is her characteristic symptoms after missing dialysis.  She reports no other acute medical complaints. Independent Historian contributed to assessment above: Family member at bedside corroborates information given above.       Physical Exam   Triage Vital Signs: ED Triage Vitals  Enc Vitals Group     BP 05/11/22 0048 (!) 168/95     Pulse Rate 05/11/22 0048 81     Resp 05/11/22 0048 18     Temp 05/11/22 0048 98.2 F (36.8 C)     Temp Source 05/11/22 0048 Oral     SpO2 05/11/22 0048 96 %     Weight 05/11/22 0047 256 lb 8 oz (116.3 kg)     Height 05/11/22 0047 5\' 7"  (1.702 m)     Head Circumference --      Peak Flow --      Pain Score 05/11/22 0047 8     Pain Loc --      Pain Edu? --      Excl. in GC? --     Most recent vital signs: Vitals:   05/11/22 0220 05/11/22 0230  BP: (!) 192/105 (!) 188/94  Pulse: 74 77  Resp: 20 20  Temp:    SpO2: 96% 96%    General: Awake, tachypneic respiratory distress. CV:  Good peripheral perfusion.  Bilateral lower extremity edema. Resp:  Respiratory distress with scant wheezing and rales in bilateral bases Abd:  No distention.     ED Results / Procedures / Treatments   Labs (all labs ordered are listed, but only abnormal results are displayed) Labs Reviewed  CBC - Abnormal; Notable for the following components:      Result Value   RBC 3.00 (*)    Hemoglobin 7.8 (*)    HCT 24.9 (*)    RDW 16.0 (*)    All other components within  normal limits  BLOOD GAS, VENOUS - Abnormal; Notable for the following components:   Bicarbonate 29.5 (*)    Acid-Base Excess 2.3 (*)    All other components within normal limits  BASIC METABOLIC PANEL  BRAIN NATRIURETIC PEPTIDE  HEPATITIS B SURFACE ANTIGEN  HEPATITIS B SURFACE ANTIBODY, QUANTITATIVE  TROPONIN I (HIGH SENSITIVITY)  TROPONIN I (HIGH SENSITIVITY)     I ordered and reviewed the above labs they are notable for normal pH on VBG after starting BiPAP.  She has a hemoglobin of 7.8 which is decreased from baseline of 12 from March 2024.  EKG  ED ECG REPORT I, Pilar Jarvis, the attending physician, personally viewed and interpreted this ECG.   Date: 05/11/2022  EKG Time: 0050  Rate: 81  Rhythm: sinus  Axis: nl  Intervals:none  ST&T Change: No STEMI, no peaked T's nor QRS widening concerning for hyperkalemia changes    RADIOLOGY I independently reviewed and interpreted chest x-ray and see some bilateral opacities concerning for pulmonary edema.   PROCEDURES:  Critical Care performed: Yes, see critical care procedure note(s)  .Critical Care  Performed  by: Pilar Jarvis, MD Authorized by: Pilar Jarvis, MD   Critical care provider statement:    Critical care time (minutes):  30   Critical care was time spent personally by me on the following activities:  Development of treatment plan with patient or surrogate, discussions with consultants, evaluation of patient's response to treatment, examination of patient, ordering and review of laboratory studies, ordering and review of radiographic studies, ordering and performing treatments and interventions, pulse oximetry, re-evaluation of patient's condition and review of old charts    MEDICATIONS ORDERED IN ED: Medications  nitroGLYCERIN 50 mg in dextrose 5 % 250 mL (0.2 mg/mL) infusion (20 mcg/min Intravenous Rate/Dose Change 05/11/22 0231)  morphine (PF) 4 MG/ML injection 4 mg (4 mg Intravenous Given 05/11/22 0148)   ondansetron (ZOFRAN) injection 4 mg (4 mg Intravenous Given 05/11/22 0146)  furosemide (LASIX) injection 80 mg (80 mg Intravenous Given 05/11/22 0149)    External physician / consultants:  I spoke with Dr Mickie Kay of renal regarding care plan for this patient.   IMPRESSION / MDM / ASSESSMENT AND PLAN / ED COURSE  I reviewed the triage vital signs and the nursing notes.                                Patient's presentation is most consistent with acute presentation with potential threat to life or bodily function.  Differential diagnosis includes, but is not limited to, acute fluid overload from missed dialysis, pulmonary edema, respiratory distress, hypercarbic respiratory failure, respiratory failure, anemia of chronic disease/kidney disease, anemia due to GI bleeding   The patient is on the cardiac monitor to evaluate for evidence of arrhythmia and/or significant heart rate changes.  MDM:   Respiratory distress - in the setting of missing dialysis appears fluid overloaded will start BiPAP and nitro drip given her hypertension.  Spoke with Dr. Wynelle Link of renal who will emergently dialyze, no signs of hyperkalemia changes on EKG, labs pending.  VBG looks normal but obtained after she started BiPAP, was a difficult access.  No respiratory infectious symptoms so I will defer antibiotics.   Anemia - She reports no GI bleeding but her H&H is decreased from prior labs obtained several months ago.  Given her respiratory distress I did not perform a rectal exam, will require repeat H&H while inpatient.   Hyperkalemia 5.7 with no EKG changes given hyperkalemic treatment in consultation with Dr. Wynelle Link  Admission.        FINAL CLINICAL IMPRESSION(S) / ED DIAGNOSES   Final diagnoses:  Hypervolemia, unspecified hypervolemia type  Respiratory distress  Anemia, unspecified type     Rx / DC Orders   ED Discharge Orders     None        Note:  This document was prepared using  Dragon voice recognition software and may include unintentional dictation errors.    Pilar Jarvis, MD 05/11/22 1610    Pilar Jarvis, MD 05/11/22 [phone]

## 2022-05-11 NOTE — H&P (Signed)
History and Physical    Patricia [name] ZOX:[id number] DOB: 08-Jun-1975 DOA: 05/11/2022  PCP: Rick Duff, PA-C   Patient coming from: Home  Chief Complaint: SOB   HPI: Patricia [name] is a 47 y.o. female with medical history significant for hypertension, ESRD on hemodialysis, neuropathy, dystonia, depression, and anxiety who presents emergency department with worsening shortness of breath.  Patient reports completing dialysis on 12/06/2022.  Her subsequent dialysis session was missed due to occlusion of her AVF which has since been declotted.  Her daughter noted that she was beginning to wheeze yesterday morning but the patient continued to feel fairly well at that time.  Throughout the course of the day, and wheezing progressed, she began developing worsening shortness of breath, and family noted that her face appeared to be edematous.  She was also experiencing some pleuritic chest pain.  She attempted to lay down last night when her dyspnea worsened acutely and she was unable to catch her breath.  Patient and her daughter state that this constellation of symptoms is consistent with her prior episodes of hypervolemia from missed dialysis sessions.  ED Course: Upon arrival to the ED, patient is found to be afebrile and saturating mid 90s on room air with elevated blood pressure.  EKG demonstrates sinus rhythm and chest x-ray notable for patchy airspace disease.  Labs are most notable for potassium 5.7, BUN 100, hemoglobin 7.8, normal troponin, and BNP 623.  Nephrology was consulted the ED physician, patient was started on BiPAP and nitroglycerin infusions, and was also treated with insulin with dextrose, nebulized albuterol, Lokelma, morphine, Zofran, and IV Lasix in the ED.    Review of Systems:  All other systems reviewed and apart from HPI, are negative.  Past Medical History:  Diagnosis Date   Acid reflux    Anemia 04/2019   REQUIRING BLOOD TRANSFUSIONS    Anxiety    Arthritis    CHF (congestive heart failure) (HCC)    Dyspnea    with exertion   Dysrhythmia    tachycardia   ESRD (end stage renal disease) (HCC)    [facility]   Facial twitching 11/2020   mouth twitch, eyes roll back, neurology started patient on low dose   Sinemet IR.   GI bleeding 12/2017   Gout    Headache(784.0)    "related to chemo; sometimes weekly" (09/12/2013)   Heart murmur    Systolic per Dr Orpah Cobb' notes   High cholesterol    History of blood transfusion "a couple"   "related to low counts"   Hypertension    Iron deficiency anemia    "get epogen shots q month" (02/20/2014)   MCGN (minimal change glomerulonephritis)    "using chemo to tx;  finished my last tx in 12/2013"   Neuropathy    Peritoneal dialysis status (HCC)    Stroke (HCC) 08/15/2017   ruled out that it was not a stroke    Past Surgical History:  Procedure Laterality Date   ABDOMINAL HYSTERECTOMY  2010   "laparoscopic"   ANKLE FRACTURE SURGERY Right 1994   AV FISTULA PLACEMENT Left 04/14/2016   Procedure: ARTERIOVENOUS (AV) FISTULA CREATION;  Surgeon: Chuck Hint, MD;  Location: [facility] OR;  Service: Vascular;  Laterality: Left;   AV FISTULA PLACEMENT Left 08/09/2017   Procedure: INSERTION OF ARTERIOVENOUS (AV) GORE-TEX GRAFT LEFT ARM;  Surgeon: Sherren Kerns, MD;  Location: [facility] OR;  Service: Vascular;  Laterality: Left;   AV FISTULA PLACEMENT Right 03/15/2020  Procedure: RIGHT ARM ARTERIOVENOUS (AV) FISTULA CREATION 1st Stage Basilic Transposition;  Surgeon: Nada Libman, MD;  Location: [facility] OR;  Service: Vascular;  Laterality: Right;   AV FISTULA PLACEMENT Right 02/24/2021   Procedure: RIGHT ARM ARTERIOVENOUS  FISTULA CREATION;  Surgeon: Victorino Sparrow, MD;  Location: [facility] OR;  Service: Vascular;  Laterality: Right;  regional block   AVGG REMOVAL Left 08/11/2017   Procedure: REMOVAL OF ARTERIOVENOUS GORETEX GRAFT (AVGG);  Surgeon: Nada Libman, MD;  Location: [facility] OR;   Service: Vascular;  Laterality: Left;   BASCILIC VEIN TRANSPOSITION Right 05/17/2020   Procedure: RIGHT UPPER EXTREMITY SECOND STAGE BASCILIC VEIN TRANSPOSITION;  Surgeon: Nada Libman, MD;  Location: [facility] OR;  Service: Vascular;  Laterality: Right;   BIOPSY  09/03/2017   Procedure: BIOPSY;  Surgeon: Kathi Der, MD;  Location: [facility] ENDOSCOPY;  Service: Gastroenterology;;   BIOPSY  12/24/2017   Procedure: BIOPSY;  Surgeon: Lemar Lofty., MD;  Location: [facility] ENDOSCOPY;  Service: Gastroenterology;;   CARDIAC CATHETERIZATION  2000's   COLONOSCOPY WITH PROPOFOL N/A 09/04/2017   Procedure: COLONOSCOPY WITH PROPOFOL;  Surgeon: Kerin Salen, MD;  Location: [facility] ENDOSCOPY;  Service: Gastroenterology;  Laterality: N/A;   ENTEROSCOPY N/A 12/24/2017   Procedure: ENTEROSCOPY;  Surgeon: Meridee Score Netty Starring., MD;  Location: [facility] ENDOSCOPY;  Service: Gastroenterology;  Laterality: N/A;   ESOPHAGOGASTRODUODENOSCOPY     ESOPHAGOGASTRODUODENOSCOPY (EGD) WITH PROPOFOL Left 06/04/2017   Procedure: ESOPHAGOGASTRODUODENOSCOPY (EGD) WITH PROPOFOL;  Surgeon: Willis Modena, MD;  Location: [facility] ENDOSCOPY;  Service: Endoscopy;  Laterality: Left;   ESOPHAGOGASTRODUODENOSCOPY (EGD) WITH PROPOFOL N/A 09/03/2017   Procedure: ESOPHAGOGASTRODUODENOSCOPY (EGD) WITH PROPOFOL;  Surgeon: Kathi Der, MD;  Location: [facility] ENDOSCOPY;  Service: Gastroenterology;  Laterality: N/A;   ESOPHAGOGASTRODUODENOSCOPY (EGD) WITH PROPOFOL N/A 04/27/2022   Procedure: ESOPHAGOGASTRODUODENOSCOPY (EGD) WITH PROPOFOL;  Surgeon: Regis Bill, MD;  Location: [facility] ENDOSCOPY;  Service: Endoscopy;  Laterality: N/A;   FISTULA SUPERFICIALIZATION Left 04/02/2017   Procedure: FISTULA SUPERFICIALIZATION LEFT ARM;  Surgeon: Nada Libman, MD;  Location: [facility] OR;  Service: Vascular;  Laterality: Left;   FISTULA SUPERFICIALIZATION Right 04/14/2021   Procedure: RIGHT ARM FISTULA SUPERFICIALIZATION;  Surgeon: Victorino Sparrow, MD;  Location: [facility] OR;   Service: Vascular;  Laterality: Right;  PERIPHERAL NERVE BLOCK   FISTULOGRAM Left 04/07/2016   Procedure: FISTULOGRAM;  Surgeon: Maeola Harman, MD;  Location: [facility] OR;  Service: Vascular;  Laterality: Left;   FRACTURE SURGERY     right ankle   GIVENS CAPSULE STUDY N/A 10/29/2017   Procedure: GIVENS CAPSULE STUDY;  Surgeon: Charlott Rakes, MD;  Location: [facility] ENDOSCOPY;  Service: Endoscopy;  Laterality: N/A;   GIVENS CAPSULE STUDY N/A 12/24/2017   Procedure: GIVENS CAPSULE STUDY;  Surgeon: Lemar Lofty., MD;  Location: [facility] ENDOSCOPY;  Service: Gastroenterology;  Laterality: N/A;   GIVENS CAPSULE STUDY N/A 05/22/2018   Procedure: GIVENS CAPSULE STUDY;  Surgeon: Lemar Lofty., MD;  Location: [facility] ENDOSCOPY;  Service: Gastroenterology;  Laterality: N/A;   INSERTION OF DIALYSIS CATHETER Right 04/07/2016   Procedure: INSERTION OF DIALYSIS CATHETER;  Surgeon: Maeola Harman, MD;  Location: [facility] OR;  Service: Vascular;  Laterality: Right;   INSERTION OF DIALYSIS CATHETER Right 04/04/2017   Procedure: INSERTION OF DIALYSIS CATHETER;  Surgeon: Maeola Harman, MD;  Location: [facility] OR;  Service: Vascular;  Laterality: Right;   IR FLUORO GUIDE CV LINE LEFT  03/13/2020   IR US GUIDE VASC ACCESS LEFT  03/13/2020   LEFT HEART CATH AND CORONARY ANGIOGRAPHY  N/A 05/12/2019   Procedure: LEFT HEART CATH AND CORONARY ANGIOGRAPHY;  Surgeon: Orpah Cobb, MD;  Location: [facility] INVASIVE CV LAB;  Service: Cardiovascular;  Laterality: N/A;   LIGATION OF ARTERIOVENOUS  FISTULA Left 04/14/2016   Procedure: LIGATION OF ARTERIOVENOUS  FISTULA;  Surgeon: Chuck Hint, MD;  Location: [facility] OR;  Service: Vascular;  Laterality: Left;   LIGATION OF COMPETING BRANCHES OF ARTERIOVENOUS FISTULA Right 05/17/2020   Procedure: LIGATION OF COMPETING BRANCHES OF RIGHT ARM ARTERIOVENOUS FISTULA;  Surgeon: Nada Libman, MD;  Location: [facility] OR;  Service: Vascular;  Laterality: Right;   PATCH ANGIOPLASTY  Left 06/19/2016   Procedure: PATCH ANGIOPLASTY LEFT ARTERIOVENOUS FISTULA;  Surgeon: Chuck Hint, MD;  Location: [facility] OR;  Service: Vascular;  Laterality: Left;   POLYPECTOMY  09/04/2017   Procedure: POLYPECTOMY;  Surgeon: Kerin Salen, MD;  Location: [facility] ENDOSCOPY;  Service: Gastroenterology;;   REVISON OF ARTERIOVENOUS FISTULA Left 06/19/2016   Procedure: REVISION SUPERFICIALIZATION OF BRACHIOCEPHALIC ARTERIOVENOUS FISTULA;  Surgeon: Chuck Hint, MD;  Location: [facility] OR;  Service: Vascular;  Laterality: Left;   UPPER EXTREMITY VENOGRAPHY Bilateral 02/19/2021   Procedure: UPPER EXTREMITY VENOGRAPHY;  Surgeon: Victorino Sparrow, MD;  Location: [facility] INVASIVE CV LAB;  Service: Cardiovascular;  Laterality: Bilateral;    Social History:   reports that she has never smoked. She has never used smokeless tobacco. She reports that she does not drink alcohol and does not use drugs.  Allergies  Allergen Reactions   Nsaids Other (See Comments)    Cannot take due to Kidney disease/Kidney function  Other Reaction(s): Unknown   Tolmetin Other (See Comments)    Cannot take due to Kidney Disease    Family History  Problem Relation Age of Onset   Hypertension Mother    Thyroid disease Mother    Coronary artery disease Father    Hypertension Father    Diabetes Father    Colon cancer Maternal Aunt    Esophageal cancer Maternal Uncle    Inflammatory bowel disease Neg Hx    Liver disease Neg Hx    Pancreatic cancer Neg Hx    Rectal cancer Neg Hx    Stomach cancer Neg Hx      Prior to Admission medications   Medication Sig Start Date End Date Taking? Authorizing Provider  acetaminophen (TYLENOL) 500 MG tablet Take 1,000 mg by mouth every 6 (six) hours as needed for moderate pain.    [provider]  allopurinol (ZYLOPRIM) 100 MG tablet Take 100 mg by mouth at bedtime.     [provider]  ALPRAZolam Prudy Feeler) 1 MG tablet Take 1 mg by mouth 2 (two) times daily as needed  for anxiety or sleep. 03/19/17   [provider]  carbidopa-levodopa (SINEMET IR) 25-100 MG tablet TAKE 1 TABLET BY MOUTH THREE TIMES DAILY 05/05/22   Levert Feinstein, MD  Diclofenac Sodium 1 % CREA 1 gram qid prn 05/26/21   Levert Feinstein, MD  DULoxetine (CYMBALTA) 30 MG capsule Take 1 capsule (30 mg total) by mouth daily. 05/06/22   Levert Feinstein, MD  ezetimibe (ZETIA) 10 MG tablet Take 10 mg by mouth daily. 02/21/21   [provider]  gabapentin (NEURONTIN) 100 MG capsule Take 3 capsules (300 mg total) by mouth 3 (three) times daily. 05/26/21   Levert Feinstein, MD  hydrOXYzine (ATARAX/VISTARIL) 25 MG tablet Take 25 mg by mouth every 6 (six) hours as needed for itching. 08/16/19   [provider]  incobotulinumtoxinA (XEOMIN) 50 units  SOLR injection Inject 50 Units into the muscle every 3 (three) months. 05/27/21   Levert Feinstein, MD  lanthanum (FOSRENOL) 1000 MG chewable tablet Chew 1,000-3,000 mg by mouth See admin instructions. Take 3000 mg with each meal and 1000-2000 mg with each snack 02/05/21   [provider]  lidocaine-prilocaine (EMLA) cream 1gram tid prn 05/26/21   Levert Feinstein, MD  metoprolol succinate (TOPROL-XL) 50 MG 24 hr tablet Take 50 mg by mouth every evening. Take with or immediately following a meal.    [provider]  multivitamin (RENA-VIT) TABS tablet Take 1 tablet by mouth at bedtime.     [provider]  omeprazole (PRILOSEC) 40 MG capsule Take 40 mg by mouth in the morning and at bedtime.    [provider]  rOPINIRole (REQUIP) 1 MG tablet Take 1 tablet (1 mg total) by mouth at bedtime. 04/29/22   Levert Feinstein, MD  traMADol (ULTRAM) 50 MG tablet Take 1 tablet (50 mg total) by mouth every 6 (six) hours as needed for moderate pain. Patient not taking: Reported on 04/27/2022 08/22/21   Hollice Espy, MD  vortioxetine HBr (TRINTELLIX) 5 MG TABS tablet Take 5 mg by mouth daily.    [provider]  zolpidem (AMBIEN) 5 MG tablet Take 5 mg  by mouth at bedtime. 04/07/21   [provider]    Physical Exam: Vitals:   05/11/22 0300 05/11/22 0310 05/11/22 0320 05/11/22 0330  BP: (!) 152/100 (!) 181/103 (!) 155/97 (!) 169/96  Pulse: 73 74 74 74  Resp: 20 19 20  (!) 21  Temp:      TempSrc:      SpO2: 96% 96% 96% 96%  Weight:      Height:        Constitutional: NAD, no pallor or diaphoresis  Eyes: PERTLA, lids and conjunctivae normal ENMT: Mucous membranes are moist. Posterior pharynx clear of any exudate or lesions.   Neck: supple, no masses  Respiratory: Dyspneic with speech, labored respirations, fine rales b/l.  Cardiovascular: S1 & S2 heard, regular rate and rhythm. JVD noted.  Abdomen: No distension, no tenderness, soft. Bowel sounds active.  Musculoskeletal: no clubbing / cyanosis. No joint deformity upper and lower extremities.   Skin: no significant rashes, lesions, ulcers. Warm, dry, well-perfused. Neurologic: CN 2-12 grossly intact. Moving all extremities. Alert and oriented.  Psychiatric: Calm. Cooperative.    Labs and Imaging on Admission: I have personally reviewed following labs and imaging studies  CBC: Recent Labs  Lab 05/11/22 0051  WBC 7.4  HGB 7.8*  HCT 24.9*  MCV 83.0  PLT 232   Basic Metabolic Panel: Recent Labs  Lab 05/11/22 0051  NA 137  K 5.7*  CL 97*  CO2 24  GLUCOSE 106*  BUN 100*  CREATININE 12.72*  CALCIUM 8.7*   GFR: Estimated Creatinine Clearance: 7.1 mL/min (A) (by C-G formula based on SCr of 12.72 mg/dL (H)). Liver Function Tests: No results for input(s): "AST", "ALT", "ALKPHOS", "BILITOT", "PROT", "ALBUMIN" in the last 168 hours. No results for input(s): "LIPASE", "AMYLASE" in the last 168 hours. No results for input(s): "AMMONIA" in the last 168 hours. Coagulation Profile: No results for input(s): "INR", "PROTIME" in the last 168 hours. Cardiac Enzymes: No results for input(s): "CKTOTAL", "CKMB", "CKMBINDEX", "TROPONINI" in the last 168 hours. BNP (last 3  results) No results for input(s): "PROBNP" in the last 8760 hours. HbA1C: No results for input(s): "HGBA1C" in the last 72 hours. CBG: No results for input(s): "  GLUCAP" in the last 168 hours. Lipid Profile: No results for input(s): "CHOL", "HDL", "LDLCALC", "TRIG", "CHOLHDL", "LDLDIRECT" in the last 72 hours. Thyroid Function Tests: No results for input(s): "TSH", "T4TOTAL", "FREET4", "T3FREE", "THYROIDAB" in the last 72 hours. Anemia Panel: No results for input(s): "VITAMINB12", "FOLATE", "FERRITIN", "TIBC", "IRON", "RETICCTPCT" in the last 72 hours. Urine analysis:    Component Value Date/Time   COLORURINE COLORLESS (A) 04/03/2018 2319   APPEARANCEUR CLEAR 04/03/2018 2319   LABSPEC 1.006 04/03/2018 2319   PHURINE 8.0 04/03/2018 2319   GLUCOSEU >=500 (A) 04/03/2018 2319   HGBUR NEGATIVE 04/03/2018 2319   BILIRUBINUR NEGATIVE 04/03/2018 2319   KETONESUR NEGATIVE 04/03/2018 2319   PROTEINUR 100 (A) 04/03/2018 2319   UROBILINOGEN 0.2 09/19/2014 1620   NITRITE NEGATIVE 04/03/2018 2319   LEUKOCYTESUR NEGATIVE 04/03/2018 2319   Sepsis Labs: @LABRCNTIP (procalcitonin:4,lacticidven:4) )No results found for this or any previous visit (from the past 240 hour(s)).   Radiological Exams on Admission: DG Chest Portable 1 View  Result Date: 05/11/2022 CLINICAL DATA:  Shortness of breath and chest pain. EXAM: PORTABLE CHEST 1 VIEW COMPARISON:  Portable chest 03/21/2022 FINDINGS: There is patchy airspace disease in the left lower lung field, most likely in the lower lobe as the left heart border remains well-defined. The remainder of the lungs are clear apart from central bronchial thickening. No pleural effusion is seen. There is mild chronic elevation of the right diaphragm. The cardiomediastinal silhouette is stable with mild cardiomegaly. No vascular congestion is seen. The thoracic cage is intact. Multiple overlying monitor wires. IMPRESSION: Patchy airspace disease in the left lower lung  field, most likely in the lower lobe, suspicious for pneumonia. Clinical correlation and radiographic follow-up recommended preferably with PA and lateral views. Electronically Signed   By: Almira Bar M.D.   On: 05/11/2022 01:27    EKG: Independently reviewed. Sinus rhythm.   Assessment/Plan   1. ESRD; hypervolemia with respiratory distress; hyperkalemia  - Appreciate nephrology, planning for urgent HD  - She was given temporizing measures for hyperkalemia in ED  - Continue cardiac monitoring, continue BiPAP for now, restrict fluids, renally-dose medications, repeat chem panel    2. Hypertensive urgency  - Secondary to hypervolemia, anticipate improvement with HD   - Continue nitroglycerin infusion for now, resume her oral antihypertensives once off of BiPAP    3. Anemia  - Hgb 7.8 on admission, down from 11.9 in March 2024  - No overt bleeding  - Check anemia panel and FOBT, trend CBCs, transfuse if needed    DVT prophylaxis: sq heparin   Code Status: Full  Level of Care: Level of care: Stepdown Family Communication: Daughter at bedside  Disposition Plan:  Patient is from: home  Anticipated d/c is to: Home  Anticipated d/c date is: 05/14/22 Patient currently: Pending stable volume & respiratory status  Consults called: Nephrology  Admission status: Inpatient     Briscoe Deutscher, MD [facility]  05/11/2022, 4:02 AM

## 2022-05-11 NOTE — Progress Notes (Signed)
RN to ICU  Rm 8 to do bedside tx   Informed consent signed and in chart.    TX duration:3.5   Hand-off given to patient's nurse. Carolyne Littles RN   Access used: Rt Upper AVF Access issues: None   Total UF removed: 2800 Post WT 111.9kg  Medication(s) given:none  Post HD VS: [facility] Kidney Dialysis Unit

## 2022-05-11 NOTE — ED Notes (Signed)
Dr. Modesto Charon sent secure chat informing him pt was in room.

## 2022-05-11 NOTE — Plan of Care (Signed)
Patient was seen and examined in the ICU.  Patient is feeling improvement after hemodialysis, respiratory failure resolved patient is off the BiPAP right now.  Still on supplemental O2 inhalation which will be weaned off gradually.  Patient was complaining of headache, RN was advised to give Tylenol.  Patient was hungry, diet was resumed.  We will continue current treatment and follow along.

## 2022-05-11 NOTE — Progress Notes (Signed)
Pt transported on Bipap from ED to ICU without incident 

## 2022-05-11 NOTE — Progress Notes (Signed)
[facility]  ROUNDING NOTE   Subjective:   [name] was admitted to [facility] on 05/11/2022 for Shortness of Breath  Last hemodialysis treatment was Wednesday May 1.   Patient went to the dialysis unit of Friday at her usual scheduled time where her AVF was found to be occluded. Patient was sent to [facility] in [place] where her AVF was accessed and declotted.   Patient was then instructed to come for treatment on Saturday at her dialysis unit. However there was no chair times available so patient decided to wait until Monday at her usual chair time.   Patient unfortunately started to have wheezing and shortness of breath and then presented to the [facility] ED.   Patient placed on BIPAP and nephrology consulted for urgent dialysis. Nitroglycerin gtt ordered for hypertension.   Daughter at bedside.    Objective:  Vital signs in last 24 hours:  Temp:  [98.2 F (36.8 C)] 98.2 F (36.8 C) (05/06 0048) Pulse Rate:  [73-81] 73 (05/06 0240) Resp:  [18-31] 24 (05/06 0240) BP: (168-217)/(93-105) 174/105 (05/06 0240) SpO2:  [96 %-100 %] 96 % (05/06 0240) FiO2 (%):  [21 %] 21 % (05/06 0115) Weight:  [116.3 kg] 116.3 kg (05/06 0047)  Weight change:  Filed Weights   05/11/22 0047  Weight: 116.3 kg    Intake/Output: No intake/output data recorded.   Intake/Output this shift:  No intake/output data recorded.  Physical Exam: General: Ill appearing  Head: BIPAP  Eyes: Anicteric, PERRL  Neck: Supple, trachea midline  Lungs:  Bilateral crackles  Heart: Regular rate and rhythm  Abdomen:  Soft, nontender, obese  Extremities:  + peripheral edema.  Neurologic: Nonfocal, moving all four extremities  Skin: No lesions  Access: Left AVF +bruit +thrill, overlying suture    Basic Metabolic Panel: Recent Labs  Lab 05/11/22 0051  NA 137  K 5.7*  CL 97*  CO2 24  GLUCOSE 106*  BUN 100*  CREATININE 12.72*  CALCIUM 8.7*    Liver Function Tests: No  results for input(s): "AST", "ALT", "ALKPHOS", "BILITOT", "PROT", "ALBUMIN" in the last 168 hours. No results for input(s): "LIPASE", "AMYLASE" in the last 168 hours. No results for input(s): "AMMONIA" in the last 168 hours.  CBC: Recent Labs  Lab 05/11/22 0051  WBC 7.4  HGB 7.8*  HCT 24.9*  MCV 83.0  PLT 232    Cardiac Enzymes: No results for input(s): "CKTOTAL", "CKMB", "CKMBINDEX", "TROPONINI" in the last 168 hours.  BNP: Invalid input(s): "POCBNP"  CBG: No results for input(s): "GLUCAP" in the last 168 hours.  Microbiology: Results for orders placed or performed during the hospital encounter of 05/15/20  SARS CORONAVIRUS 2 (TAT 6-24 HRS) Nasopharyngeal Nasopharyngeal Swab     Status: None   Collection Time: 05/15/20 10:36 AM   Specimen: Nasopharyngeal Swab  Result Value Ref Range Status   SARS Coronavirus 2 NEGATIVE NEGATIVE Final    Comment: (NOTE) SARS-CoV-2 target nucleic acids are NOT DETECTED.  The SARS-CoV-2 RNA is generally detectable in upper and lower respiratory specimens during the acute phase of infection. Negative results do not preclude SARS-CoV-2 infection, do not rule out co-infections with other pathogens, and should not be used as the sole basis for treatment or other patient management decisions. Negative results must be combined with clinical observations, patient history, and epidemiological information. The expected result is Negative.  Fact Sheet for Patients: HairSlick.no  Fact Sheet for Healthcare Providers: quierodirigir.com  This test is not yet approved  or cleared by the [place] and  has been authorized for detection and/or diagnosis of SARS-CoV-2 by FDA under an Emergency Use Authorization (EUA). This EUA will remain  in effect (meaning this test can be used) for the duration of the COVID-19 declaration under Se ction 564(b)(1) of the Act, 21 U.S.C. section  360bbb-3(b)(1), unless the authorization is terminated or revoked sooner.  Performed at [facility], [address], [place], [place] [address]     Coagulation Studies: No results for input(s): "LABPROT", "INR" in the last 72 hours.  Urinalysis: No results for input(s): "COLORURINE", "LABSPEC", "PHURINE", "GLUCOSEU", "HGBUR", "BILIRUBINUR", "KETONESUR", "PROTEINUR", "UROBILINOGEN", "NITRITE", "LEUKOCYTESUR" in the last 72 hours.  Invalid input(s): "APPERANCEUR"    Imaging: DG Chest Portable 1 View  Result Date: 05/11/2022 CLINICAL DATA:  Shortness of breath and chest pain. EXAM: PORTABLE CHEST 1 VIEW COMPARISON:  Portable chest 03/21/2022 FINDINGS: There is patchy airspace disease in the left lower lung field, most likely in the lower lobe as the left heart border remains well-defined. The remainder of the lungs are clear apart from central bronchial thickening. No pleural effusion is seen. There is mild chronic elevation of the right diaphragm. The cardiomediastinal silhouette is stable with mild cardiomegaly. No vascular congestion is seen. The thoracic cage is intact. Multiple overlying monitor wires. IMPRESSION: Patchy airspace disease in the left lower lung field, most likely in the lower lobe, suspicious for pneumonia. Clinical correlation and radiographic follow-up recommended preferably with PA and lateral views. Electronically Signed   By: Almira Bar M.D.   On: 05/11/2022 01:27     Medications:    nitroGLYCERIN 25 mcg/min (05/11/22 0244)      Assessment/ Plan:  [name] is a 47 y.o. black female with end stage renal disease on hemodialysis secondary to minimal change disease, hypertension, congestive heart failure, hyperlipidemia, CVA, peripheral neuropathy, anemia, gout, GERD and anxiety who presents to [facility] on 05/11/2022 for Shortness of Breath  [facility] [address] MWF Left AVF 111.5kg  End Stage Renal Disease  with volume overload, pulmonary edema and hyperkalemia. Last hemodialysis treatment was 5 days ago. Patient placed on noninvasive ventilation for acute respiratory distress.  - Insulin, dextrose and Lokelma ordered - hemodialysis orders placed. UF goal of 3 liters.   Hypertension with chronic kidney disease: hypertensive emergency. Home regimen of metoprolol. Placed on NTG drip.  - volume driven. Will benefit from hemodialysis.  - resume metoprolol when patient able to take PO medications.   Anemia with chronic kidney disease: hemoglobin 7.8. Holding outpatient Mircera due to hypertension.   Secondary Hyperparathyroidism:  - patient gets cinacalcet three times a week.  - Lanthanum with meals.   Critical Care time: 12:59am - 3:01am.    LOS: 0 Avett Reineck 5/6/20242:48 AM

## 2022-05-12 DIAGNOSIS — E8770 Fluid overload, unspecified: Secondary | ICD-10-CM | POA: Diagnosis not present

## 2022-05-12 DIAGNOSIS — N289 Disorder of kidney and ureter, unspecified: Secondary | ICD-10-CM | POA: Diagnosis not present

## 2022-05-12 LAB — CBC
HCT: 21.7 % — ABNORMAL LOW (ref 36.0–46.0)
HCT: 24.5 % — ABNORMAL LOW (ref 36.0–46.0)
Hemoglobin: 6.9 g/dL — ABNORMAL LOW (ref 12.0–15.0)
Hemoglobin: 7.8 g/dL — ABNORMAL LOW (ref 12.0–15.0)
MCH: 25.9 pg — ABNORMAL LOW (ref 26.0–34.0)
MCH: 26.1 pg (ref 26.0–34.0)
MCHC: 31.8 g/dL (ref 30.0–36.0)
MCHC: 31.8 g/dL (ref 30.0–36.0)
MCV: 81.6 fL (ref 80.0–100.0)
MCV: 81.9 fL (ref 80.0–100.0)
Platelets: 192 10*3/uL (ref 150–400)
Platelets: 194 10*3/uL (ref 150–400)
RBC: 2.66 MIL/uL — ABNORMAL LOW (ref 3.87–5.11)
RBC: 2.99 MIL/uL — ABNORMAL LOW (ref 3.87–5.11)
RDW: 15.3 % (ref 11.5–15.5)
RDW: 15.7 % — ABNORMAL HIGH (ref 11.5–15.5)
WBC: 5.8 10*3/uL (ref 4.0–10.5)
WBC: 6.5 10*3/uL (ref 4.0–10.5)
nRBC: 0 % (ref 0.0–0.2)
nRBC: 0 % (ref 0.0–0.2)

## 2022-05-12 LAB — MAGNESIUM: Magnesium: 1.9 mg/dL (ref 1.7–2.4)

## 2022-05-12 LAB — BASIC METABOLIC PANEL
Anion gap: 14 (ref 5–15)
BUN: 60 mg/dL — ABNORMAL HIGH (ref 6–20)
CO2: 29 mmol/L (ref 22–32)
Calcium: 8.2 mg/dL — ABNORMAL LOW (ref 8.9–10.3)
Chloride: 90 mmol/L — ABNORMAL LOW (ref 98–111)
Creatinine, Ser: 8.94 mg/dL — ABNORMAL HIGH (ref 0.44–1.00)
GFR, Estimated: 5 mL/min — ABNORMAL LOW (ref >60)
Glucose, Bld: 92 mg/dL (ref 70–99)
Potassium: 5.4 mmol/L — ABNORMAL HIGH (ref 3.5–5.1)
Sodium: 133 mmol/L — ABNORMAL LOW (ref 135–145)

## 2022-05-12 LAB — HEPATITIS B E ANTIGEN: Hep B E Ag: NEGATIVE

## 2022-05-12 LAB — BPAM RBC
Blood Product Expiration Date: 202405282359
ISSUE DATE / TIME: 202405071425

## 2022-05-12 LAB — TYPE AND SCREEN

## 2022-05-12 LAB — HEPATITIS B SURFACE ANTIBODY, QUANTITATIVE
Hep B S AB Quant (Post): 54.3 m[IU]/mL (ref >9.9)
Hep B S AB Quant (Post): 55.4 m[IU]/mL (ref >9.9)

## 2022-05-12 LAB — VITAMIN D 25 HYDROXY (VIT D DEFICIENCY, FRACTURES): Vit D, 25-Hydroxy: 34.05 ng/mL (ref 30–100)

## 2022-05-12 LAB — PREPARE RBC (CROSSMATCH)

## 2022-05-12 LAB — PHOSPHORUS: Phosphorus: 6.6 mg/dL — ABNORMAL HIGH (ref 2.5–4.6)

## 2022-05-12 MED ORDER — LANTHANUM CARBONATE 500 MG PO CHEW
3000.0000 mg | CHEWABLE_TABLET | Freq: Three times a day (TID) | ORAL | Status: DC
  Administered 2022-05-12 (×2): 3000 mg via ORAL
  Filled 2022-05-12 (×6): qty 6

## 2022-05-12 MED ORDER — SODIUM CHLORIDE 0.9% IV SOLUTION: Freq: Once | INTRAVENOUS | Status: DC

## 2022-05-12 MED ORDER — ZOLPIDEM TARTRATE 5 MG PO TABS
5.0000 mg | ORAL_TABLET | Freq: Once | ORAL | Status: AC
  Administered 2022-05-12: 5 mg via ORAL
  Filled 2022-05-12: qty 1

## 2022-05-12 MED ORDER — RENA-VITE PO TABS
1.0000 | ORAL_TABLET | Freq: Every day | ORAL | Status: DC
  Administered 2022-05-12: 1 via ORAL
  Filled 2022-05-12: qty 1

## 2022-05-12 MED ORDER — LANTHANUM CARBONATE 500 MG PO CHEW: 1000.0000 mg | CHEWABLE_TABLET | Freq: Two times a day (BID) | ORAL | Status: DC | PRN

## 2022-05-12 NOTE — TOC CM/SW Note (Signed)
Went by room for readmission prevention screen but patient is off the unit for HD. Will try again later.  Charlynn Court, CSW [phone]

## 2022-05-12 NOTE — Progress Notes (Addendum)
  Received patient in bed to unit.   Informed consent signed and in chart.    TX duration: 2 hrs 10 min.  Pts venous pressures increasing and system starting to clot.  Per NP pt rinsed back 50 min early.     Transported back to floor  Hand-off given to patient's nurse. No c/o and no distress noted   advised ICU nurse that pt was unable to receive blood transfusion d/t early takeoff  Access used: RAVF Access issues: inc. Venous pressures    Total UF removed: 1.8Kg Medication(s) given: none Post HD VS: 176/93 Post HD weight: 113.5kg     Lynann Beaver  [facility] D[facility] Unit

## 2022-05-12 NOTE — TOC Initial Note (Signed)
Transition of Care [facility]) - Initial/Assessment Note    Patient Details  Name: Patricia [name] MRN: [id number] Date of Birth: 12-Jan-1975  [facility]) CM/SW Contact:    Margarito Liner, LCSW Phone Number: 05/12/2022, 3:27 PM  Clinical Narrative:   Readmission prevention screen complete. CSW met with patient. No supports at bedside. CSW introduced role and explained that discharge planning would be discussed. PCP is Betsey Holiday, PA-C in [place]. Patient typically drives herself to appointments. Pharmacy is [facility] on [address]. No issues obtaining medications. Patient lives with her oldest and youngest daughters and granddaughter. No home health prior to admission but she is interested in this. MD consulted PT and OT to make sure she qualifies. No DME use prior to admission but she has a RW and BSC if needed. Patient is not on oxygen at home but was on 2 L during assessment. According to flowsheet, she is now on room air. No further concerns. CSW encouraged patient to contact CSW as needed. CSW will continue to follow patient for support and facilitate return home once stable. Her oldest daughter, [name], will transport her home at discharge.               Expected Discharge Plan: Home w Home Health Services Barriers to Discharge: Continued Medical Work up   Patient Goals and CMS Choice            Expected Discharge Plan and Services     Post Acute Care Choice:  (TBD) Living arrangements for the past 2 months: Single Family Home                                      Prior Living Arrangements/Services Living arrangements for the past 2 months: Single Family Home Lives with:: Adult Children, Relatives Patient language and need for interpreter reviewed:: Yes Do you feel safe going back to the place where you live?: Yes      Need for Family Participation in Patient Care: Yes (Comment) Care giver support system in place?: Yes  (comment) Current home services: DME Criminal Activity/Legal Involvement Pertinent to Current Situation/Hospitalization: No - Comment as needed  Activities of Daily Living      Permission Sought/Granted                  Emotional Assessment Appearance:: Appears stated age Attitude/Demeanor/Rapport: Engaged, Gracious Affect (typically observed): Accepting, Appropriate, Calm, Pleasant Orientation: : Oriented to Self, Oriented to Place, Oriented to  Time, Oriented to Situation Alcohol / Substance Use: Not Applicable Psych Involvement: No (comment)  Admission diagnosis:  Respiratory distress [R06.03] Hypervolemia, unspecified hypervolemia type [E87.70] Anemia, unspecified type [D64.9] Hypervolemia associated with renal insufficiency [E87.70, N28.9] Patient Active Problem List   Diagnosis Date Noted   Hypervolemia associated with renal insufficiency 05/11/2022   Hyperkalemia 05/11/2022   Restless leg syndrome 04/23/2022   Gout 08/21/2021   GERD without esophagitis 08/21/2021   Anxiety and depression 08/21/2021   Neuropathic pain 05/26/2021   Dystonia 01/13/2021   Polyneuropathy associated with underlying disease (HCC) 01/13/2021   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   Other specified complication of vascular prosthetic devices, implants and grafts, initial encounter (HCC) 09/03/2020   Contact with and (suspected) exposure to covid-19 08/13/2020   Altered mental status, unspecified 06/25/2020   Other irregular eye movements 06/25/2020   PD catheter dysfunction (HCC) 04/26/2020   Other disorders of phosphorus metabolism 04/17/2020  Hypercalcemia 04/15/2020   Impacted cerumen of both ears 04/05/2020   Chronic gouty arthritis 04/05/2020   Generalized anxiety disorder 04/05/2020   History of hypertension 04/05/2020   Idiopathic peripheral neuropathy 04/05/2020   Primary insomnia 04/05/2020   Malnutrition of moderate degree 03/13/2020   Hypertensive urgency 03/11/2020   Myoclonia  03/11/2020   Abscess of vulva 02/19/2020   Pericolonic abscess 02/06/2020   Chest pain 05/10/2019   Anemia 04/25/2019   SVT (supraventricular tachycardia) 04/25/2019   Elevated troponin 04/25/2019   ESRD on dialysis (HCC) 04/25/2019   Bacterial infection, unspecified 04/17/2019   Pain, unspecified 04/17/2019   Pruritus, unspecified 04/17/2019   Benign paroxysmal positional vertigo of left ear 08/17/2018   Acute on chronic blood loss anemia 05/21/2018   GI bleed 05/20/2018   Symptomatic anemia 10/28/2017   Acidosis 05/24/2017   Chills (without fever) 05/24/2017   Coagulation defect, unspecified (HCC) 05/24/2017   Dependence on renal dialysis (HCC) 05/24/2017   Encounter for fitting and adjustment of extracorporeal dialysis catheter (HCC) 05/24/2017   Fluid overload, unspecified 05/24/2017   Iron deficiency anemia, unspecified 05/24/2017   Liver disease, unspecified 05/24/2017   Other disorders of electrolyte and fluid balance, not elsewhere classified 05/24/2017   Other disorders resulting from impaired renal tubular function 05/24/2017   Other long term (current) drug therapy 05/24/2017   Secondary hyperparathyroidism of renal origin (HCC) 05/24/2017   Unspecified abnormal findings in urine 05/24/2017   Unspecified jaundice 05/24/2017   Chronic kidney disease 05/24/2017   Type 2 diabetes mellitus with other diabetic kidney complication (HCC) 05/24/2017   ESRD on peritoneal dialysis (HCC) 04/01/2016   Anemia due to chronic kidney disease 04/01/2016   GERD (gastroesophageal reflux disease) 04/01/2016   Atypical chest pain    Nephrotic syndrome 10/02/2013   Dyslipidemia 10/02/2013   PAT (paroxysmal atrial tachycardia) 09/29/2013   Normocytic anemia 06/29/2013   Glomerulonephritis 01/28/2013   Morbid obesity (HCC) 07/04/2011   Hypertension 07/04/2011   Hypercholesterolemia 07/04/2011   PCP:  Rick Duff, PA-C Pharmacy:   [id number] [place], [place] -  [address] AT [facility] OF [address] [place] [address] Phone: [phone] Fax: [phone]  [facility] [address] [place] [address] Phone: [phone] Fax: [phone]  [facility] [place], [place] - [address] [place] [address] Phone: [phone] Fax: [phone]  [id number] - [place], [place] - [address] AT [facility] OF [address] [place] [address] Phone: [phone] Fax: [phone]  [facility] [address] [place] [address] Phone: [phone] Fax: [phone]     Social Determinants of Health (SDOH) Social History: SDOH Screenings   Food Insecurity: Food Insecurity Present (04/04/2018)  Transportation Needs: No Transportation Needs (04/04/2018)  Financial Resource Strain: Low Risk  (04/04/2018)  Physical Activity: Inactive (04/04/2018)  Social Connections: Somewhat Isolated (04/04/2018)  Stress: No Stress Concern Present (04/04/2018)  Tobacco Use: Low Risk  (05/11/2022)   SDOH Interventions:     Readmission Risk Interventions    05/12/2022    3:25 PM 03/15/2020    3:52 PM  Readmission Risk Prevention Plan  Transportation Screening Complete Complete  Medication Review (RN Care Manager) Complete Complete  PCP or Specialist appointment within 3-5 days of discharge Complete   HRI or Home Care Consult  Complete  SW  Recovery Care/Counseling Consult Complete Complete  Palliative Care Screening Not Applicable Not Applicable  Skilled Nursing Facility Not Applicable Not Applicable

## 2022-05-12 NOTE — Progress Notes (Signed)
[facility]  ROUNDING NOTE   Subjective:   [name] was admitted to [facility] on 05/11/2022 for Respiratory distress [R06.03] Hypervolemia, unspecified hypervolemia type [E87.70] Anemia, unspecified type [D64.9] Hypervolemia associated with renal insufficiency [E87.70, N28.9]  Patient underwent hemodialysis treatment yesterday. Tolerated well. Still on supplemental oxygen but off of BiPAP.   Objective:  Vital signs in last 24 hours:  Temp:  [97.5 F (36.4 C)-99.3 F (37.4 C)] 98.1 F (36.7 C) (05/07 0701) Pulse Rate:  [29-160] 80 (05/07 0800) Resp:  [14-36] 18 (05/07 0800) BP: (53-151)/(41-94) 133/78 (05/07 0645) SpO2:  [82 %-100 %] 100 % (05/07 0800) Weight:  [111.9 kg-114.7 kg] 111.9 kg (05/06 1327)  Weight change: -1.648 kg Filed Weights   05/11/22 0417 05/11/22 1002 05/11/22 1327  Weight: 116.7 kg 114.7 kg 111.9 kg    Intake/Output: I/O last 3 completed shifts: In: 812.3 [P.O.:480; I.V.:332.3] Out: 2800 [Other:2800]   Intake/Output this shift:  No intake/output data recorded.  Physical Exam: General: No acute distress  Head: [place]/AT, hearing intact  Eyes: Anicteric  Neck: Supple, trachea midline  Lungs:  Bilateral rales  Heart: Regular rate and rhythm  Abdomen:  Soft, nontender, obese  Extremities:  + peripheral edema.  Neurologic: Nonfocal, moving all four extremities  Skin: No lesions  Access: Left AVF +bruit +thrill, overlying suture    Basic Metabolic Panel: Recent Labs  Lab 05/11/22 0051 05/11/22 0544 05/11/22 1622 05/12/22 0516  NA 137 137 134* 133*  K 5.7* 6.6* 4.2 5.4*  CL 97* 97* 91* 90*  CO2 24 25 29 29   GLUCOSE 106* 136* 105* 92  BUN 100* 90* 48* 60*  CREATININE 12.72* 12.67* 7.66* 8.94*  CALCIUM 8.7* 8.5* 8.4* 8.2*  MG  --   --   --  1.9  PHOS  --  6.8*  --  6.6*     Liver Function Tests: No results for input(s): "AST", "ALT", "ALKPHOS", "BILITOT", "PROT", "ALBUMIN" in the last 168 hours. No results  for input(s): "LIPASE", "AMYLASE" in the last 168 hours. No results for input(s): "AMMONIA" in the last 168 hours.  CBC: Recent Labs  Lab 05/11/22 0051 05/11/22 0534 05/12/22 0516  WBC 7.4 7.5 6.5  HGB 7.8* 7.5* 6.9*  HCT 24.9* 24.7* 21.7*  MCV 83.0 83.7 81.6  PLT 232 244 194     Cardiac Enzymes: No results for input(s): "CKTOTAL", "CKMB", "CKMBINDEX", "TROPONINI" in the last 168 hours.  BNP: Invalid input(s): "POCBNP"  CBG: Recent Labs  Lab 05/11/22 0416  GLUCAP 75    Microbiology: Results for orders placed or performed during the hospital encounter of 05/11/22  MRSA Next Gen by PCR, Nasal     Status: None   Collection Time: 05/11/22  5:11 AM   Specimen: Nasal Mucosa; Nasal Swab  Result Value Ref Range Status   MRSA by PCR Next Gen NOT DETECTED NOT DETECTED Final    Comment: (NOTE) The GeneXpert MRSA Assay (FDA approved for NASAL specimens only), is one component of a comprehensive MRSA colonization surveillance program. It is not intended to diagnose MRSA infection nor to guide or monitor treatment for MRSA infections. Test performance is not FDA approved in patients less than 3 years old. Performed at [facility], [address] [place], [place] [address]     Coagulation Studies: No results for input(s): "LABPROT", "INR" in the last 72 hours.  Urinalysis: No results for input(s): "COLORURINE", "LABSPEC", "PHURINE", "GLUCOSEU", "HGBUR", "BILIRUBINUR", "KETONESUR", "PROTEINUR", "UROBILINOGEN", "NITRITE", "LEUKOCYTESUR" in the last  72 hours.  Invalid input(s): "APPERANCEUR"    Imaging: DG Chest Portable 1 View  Result Date: 05/11/2022 CLINICAL DATA:  Shortness of breath and chest pain. EXAM: PORTABLE CHEST 1 VIEW COMPARISON:  Portable chest 03/21/2022 FINDINGS: There is patchy airspace disease in the left lower lung field, most likely in the lower lobe as the left heart border remains well-defined. The remainder of the lungs are clear apart from  central bronchial thickening. No pleural effusion is seen. There is mild chronic elevation of the right diaphragm. The cardiomediastinal silhouette is stable with mild cardiomegaly. No vascular congestion is seen. The thoracic cage is intact. Multiple overlying monitor wires. IMPRESSION: Patchy airspace disease in the left lower lung field, most likely in the lower lobe, suspicious for pneumonia. Clinical correlation and radiographic follow-up recommended preferably with PA and lateral views. Electronically Signed   By: Almira Bar M.D.   On: 05/11/2022 01:27     Medications:    sodium chloride     nitroGLYCERIN Stopped (05/12/22 0224)    albuterol  10 mg Nebulization Once   carbidopa-levodopa  1 tablet Oral TID   Chlorhexidine Gluconate Cloth  6 each Topical Daily   insulin aspart  5 Units Intravenous Once   And   dextrose  1 ampule Intravenous Once   DULoxetine  30 mg Oral Daily   heparin  5,000 Units Subcutaneous Q8H   isosorbide mononitrate  60 mg Oral QPM   lanthanum  3,000 mg Oral TID WC   metoprolol succinate  50 mg Oral Daily   multivitamin  1 tablet Oral QHS   mouth rinse  15 mL Mouth Rinse 4 times per day   sodium chloride flush  3 mL Intravenous Q12H   sodium chloride flush  3 mL Intravenous Q12H   sodium zirconium cyclosilicate  10 g Oral Once   sodium chloride, acetaminophen **OR** acetaminophen, ALPRAZolam, HYDROmorphone (DILAUDID) injection, hydrOXYzine, lanthanum, ondansetron **OR** ondansetron (ZOFRAN) IV, mouth rinse, sodium chloride flush  Assessment/ Plan:  [name] is a 47 y.o. black female with end stage renal disease on hemodialysis secondary to minimal change disease, hypertension, congestive heart failure, hyperlipidemia, CVA, peripheral neuropathy, anemia, gout, GERD and anxiety who presents to [facility] on 05/11/2022 for Respiratory distress [R06.03] Hypervolemia, unspecified hypervolemia type [E87.70] Anemia, unspecified type  [D64.9] Hypervolemia associated with renal insufficiency [E87.70, N28.9]  [facility] MWF Left AVF 111.5kg  End Stage Renal Disease with volume overload, pulmonary edema and hyperkalemia.  Patient underwent hemodialysis treatment yesterday.  Now off of BiPAP.  Will plan for additional treatment today for added volume removal.  Hypertension with chronic kidney disease: Blood pressure significantly improved.  Continue metoprolol and Imdur.  Anemia with chronic kidney disease: Resume Mircera as an outpatient.  Secondary Hyperparathyroidism:  - patient gets cinacalcet three times a week.  - Lanthanum with meals.    LOS: 1 Patricia [name] 5/7/20248:55 AM

## 2022-05-12 NOTE — Progress Notes (Signed)
1840-report called to 2c.

## 2022-05-12 NOTE — Progress Notes (Signed)
[facility] Progress Note  Patient: Patricia [name]    WGN:[id number]  DOA: 05/11/2022     Date of Service: the patient was seen and examined on 05/12/2022  Chief Complaint  Patient presents with   Shortness of Breath   Chest Pain   Cough   Brief hospital course: Patricia [name] is a 47 y.o. female with medical history significant for hypertension, ESRD on hemodialysis, neuropathy, dystonia, depression, and anxiety who presents emergency department with worsening shortness of breath. Patient reports completing dialysis on 12/06/2022.  Her subsequent dialysis session was missed due to occlusion of her AVF which has since been declotted. As per patient's daughter patient's breathing got worse throughout the day and she was wheezing.  Most likely due to volume overload secondary to ESRD and missed hemodialysis. ED workup: Hyperkalemia potassium 5.7, anemia Macbeth 7.8, BNP 623, CXR: Patchy airspace disease in the left lower lung field, most likely in the lower lobe, suspicious for pneumonia   Assessment and Plan:  Acute hypoxic respiratory failure secondary to volume overload due to ESRD missed hemodialysis S/p BiPAP, breathing improved after hemodialysis Continue supplemental O2 inhalation and gradually wean off   ESRD, HD MWF schedule Patient missed hemodialysis on Friday due to AV fistula clotted. Hyperkalemia due to ESRD Received urgent hemodialysis, hyperkalemia resolved Hyperphosphatemia due to ESRD, continue phosphate binders Continue hemodialysis as per nephrology   Hypertensive urgency Improved after hemodialysis S/p nitroglycerin IV infusion, gradually weaned off Started Imdur 60 mg p.o. nightly Resumed Toprol-XL 50 mg p.o. daily home dose Monitor BP and titrate medication accordingly   Anemia of chronic disease, due to ESRD Hb 7.8 on admission Hb 6.9 today, 1 unit PRBC transfused Iron profile, B12 and folic acid within normal range Patient  will benefit from Epogen, nephrology is following   Parkinson disease, continue Sinemet Depression, continue Cymbalta Body mass index is 39.19 kg/m.  Interventions:      Diet: Renal diet DVT Prophylaxis: Subcutaneous Heparin    Advance goals of care discussion: Full code  Family Communication: family was not present at bedside, at the time of interview.  The pt provided permission to discuss medical plan with the family. Opportunity was given to ask question and all questions were answered satisfactorily.   Disposition:  Pt is from Home, admitted with volume overload, ESRD, still has volume overload needs hemodialysis, which precludes a safe discharge. Discharge to home, when clinically stable, most likely tomorrow a.m.  Subjective: No significant events overnight, patient was seen during hemodialysis, patient feels improvement in the shortness of breath.  Denies any headache or dizziness, no chest pain or palpitations, no any other active issues.  Physical Exam: General: NAD, lying comfortably Appear in no distress, affect appropriate Eyes: PERRLA ENT: Oral Mucosa Clear, moist  Neck: no JVD,  Cardiovascular: S1 and S2 Present, no Murmur,  Respiratory: good respiratory effort, Bilateral Air entry equal and Decreased, no Crackles, no wheezes Abdomen: Bowel Sound present, Soft and no tenderness,  Skin: no rashes Extremities: no Pedal edema, no calf tenderness Neurologic: without any new focal findings Gait not checked due to patient safety concerns  Vitals:   05/12/22 1435 05/12/22 1440 05/12/22 1445 05/12/22 1455  BP:  125/79  (!) 143/79  Pulse:  85 85 85  Resp:  15 15   Temp: 98.8 F (37.1 C) 98.8 F (37.1 C)  98.7 F (37.1 C)  TempSrc: Axillary Axillary  Axillary  SpO2:  100% 93%   Weight:  Height:        Intake/Output Summary (Last 24 hours) at 05/12/2022 1700 Last data filed at 05/12/2022 1320 Gross per 24 hour  Intake 594.88 ml  Output 800 ml  Net -205.12  ml   Filed Weights   05/11/22 1327 05/12/22 1038 05/12/22 1423  Weight: 111.9 kg 114.3 kg 113.5 kg    Data Reviewed: I have personally reviewed and interpreted daily labs, tele strips, imagings as discussed above. I reviewed all nursing notes, pharmacy notes, vitals, pertinent old records I have discussed plan of care as described above with RN and patient/family.  CBC: Recent Labs  Lab 05/11/22 0051 05/11/22 0534 05/12/22 0516  WBC 7.4 7.5 6.5  HGB 7.8* 7.5* 6.9*  HCT 24.9* 24.7* 21.7*  MCV 83.0 83.7 81.6  PLT 232 244 194   Basic Metabolic Panel: Recent Labs  Lab 05/11/22 0051 05/11/22 0544 05/11/22 1622 05/12/22 0516  NA 137 137 134* 133*  K 5.7* 6.6* 4.2 5.4*  CL 97* 97* 91* 90*  CO2 24 25 29 29   GLUCOSE 106* 136* 105* 92  BUN 100* 90* 48* 60*  CREATININE 12.72* 12.67* 7.66* 8.94*  CALCIUM 8.7* 8.5* 8.4* 8.2*  MG  --   --   --  1.9  PHOS  --  6.8*  --  6.6*    Studies: No results found.  Scheduled Meds:  sodium chloride   Intravenous Once   albuterol  10 mg Nebulization Once   carbidopa-levodopa  1 tablet Oral TID   Chlorhexidine Gluconate Cloth  6 each Topical Daily   insulin aspart  5 Units Intravenous Once   And   dextrose  1 ampule Intravenous Once   DULoxetine  30 mg Oral Daily   heparin  5,000 Units Subcutaneous Q8H   isosorbide mononitrate  60 mg Oral QPM   lanthanum  3,000 mg Oral TID WC   metoprolol succinate  50 mg Oral Daily   multivitamin  1 tablet Oral QHS   mouth rinse  15 mL Mouth Rinse 4 times per day   sodium chloride flush  3 mL Intravenous Q12H   sodium chloride flush  3 mL Intravenous Q12H   sodium zirconium cyclosilicate  10 g Oral Once   Continuous Infusions:  sodium chloride     PRN Meds: sodium chloride, acetaminophen **OR** acetaminophen, ALPRAZolam, HYDROmorphone (DILAUDID) injection, hydrOXYzine, lanthanum, ondansetron **OR** ondansetron (ZOFRAN) IV, mouth rinse, sodium chloride flush  Time spent: 55  minutes  Author: Gillis Santa. MD [facility] 05/12/2022 5:00 PM  To reach On-call, see care teams to locate the attending and reach out to them via [email or web address]. If 7PM-7AM, please contact night-coverage If you still have difficulty reaching the attending provider, please page the [facility] (Director on Call) for [facility] on amion for assistance.

## 2022-05-12 NOTE — Progress Notes (Signed)
1950: pt transferred from ICU 08 to 2C 215 on RA, monitored, accompanied by ICU RN, via wheelchair. Skin to skin verification completed between ICU RN and 2C RN. Pt belongings sent with pt to 2C and maintained at bedside.

## 2022-05-13 DIAGNOSIS — E8770 Fluid overload, unspecified: Secondary | ICD-10-CM | POA: Diagnosis not present

## 2022-05-13 DIAGNOSIS — N289 Disorder of kidney and ureter, unspecified: Secondary | ICD-10-CM | POA: Diagnosis not present

## 2022-05-13 LAB — CBC
HCT: 23.3 % — ABNORMAL LOW (ref 36.0–46.0)
Hemoglobin: 7.4 g/dL — ABNORMAL LOW (ref 12.0–15.0)
MCH: 25.9 pg — ABNORMAL LOW (ref 26.0–34.0)
MCHC: 31.8 g/dL (ref 30.0–36.0)
MCV: 81.5 fL (ref 80.0–100.0)
Platelets: 188 10*3/uL (ref 150–400)
RBC: 2.86 MIL/uL — ABNORMAL LOW (ref 3.87–5.11)
RDW: 15.3 % (ref 11.5–15.5)
WBC: 4.5 10*3/uL (ref 4.0–10.5)
nRBC: 0 % (ref 0.0–0.2)

## 2022-05-13 LAB — BASIC METABOLIC PANEL
Anion gap: 10 (ref 5–15)
BUN: 39 mg/dL — ABNORMAL HIGH (ref 6–20)
CO2: 28 mmol/L (ref 22–32)
Calcium: 8.2 mg/dL — ABNORMAL LOW (ref 8.9–10.3)
Chloride: 97 mmol/L — ABNORMAL LOW (ref 98–111)
Creatinine, Ser: 5.47 mg/dL — ABNORMAL HIGH (ref 0.44–1.00)
GFR, Estimated: 9 mL/min — ABNORMAL LOW (ref >60)
Glucose, Bld: 97 mg/dL (ref 70–99)
Potassium: 4.3 mmol/L (ref 3.5–5.1)
Sodium: 135 mmol/L (ref 135–145)

## 2022-05-13 LAB — TYPE AND SCREEN
ABO/RH(D): O NEG
Antibody Screen: NEGATIVE
Unit division: 0

## 2022-05-13 LAB — RENAL FUNCTION PANEL
Albumin: 3.1 g/dL — ABNORMAL LOW (ref 3.5–5.0)
Anion gap: 12 (ref 5–15)
BUN: 51 mg/dL — ABNORMAL HIGH (ref 6–20)
CO2: 28 mmol/L (ref 22–32)
Calcium: 8.5 mg/dL — ABNORMAL LOW (ref 8.9–10.3)
Chloride: 95 mmol/L — ABNORMAL LOW (ref 98–111)
Creatinine, Ser: 7.32 mg/dL — ABNORMAL HIGH (ref 0.44–1.00)
GFR, Estimated: 6 mL/min — ABNORMAL LOW (ref >60)
Glucose, Bld: 109 mg/dL — ABNORMAL HIGH (ref 70–99)
Phosphorus: 5.3 mg/dL — ABNORMAL HIGH (ref 2.5–4.6)
Potassium: 4.3 mmol/L (ref 3.5–5.1)
Sodium: 135 mmol/L (ref 135–145)

## 2022-05-13 LAB — BLOOD GAS, VENOUS
O2 Saturation: 56.3 %
Patient temperature: 37
pCO2, Ven: 56 mmHg (ref 44–60)
pH, Ven: 7.33 (ref 7.25–7.43)
pO2, Ven: 38 mmHg (ref 32–45)

## 2022-05-13 LAB — BPAM RBC: Unit Type and Rh: 9500

## 2022-05-13 LAB — PHOSPHORUS: Phosphorus: 3.8 mg/dL (ref 2.5–4.6)

## 2022-05-13 LAB — MAGNESIUM: Magnesium: 1.7 mg/dL (ref 1.7–2.4)

## 2022-05-13 MED ORDER — ISOSORBIDE MONONITRATE ER 60 MG PO TB24: 60.0000 mg | ORAL_TABLET | Freq: Every evening | ORAL | 11 refills | Status: DC

## 2022-05-13 MED ORDER — PENTAFLUOROPROP-TETRAFLUOROETH EX AERO
INHALATION_SPRAY | CUTANEOUS | Status: AC
  Filled 2022-05-13: qty 30

## 2022-05-13 NOTE — Progress Notes (Signed)
[facility]  ROUNDING NOTE   Subjective:   Patricia [name] was admitted to [facility] on 05/11/2022 for Respiratory distress [R06.03] Hypervolemia, unspecified hypervolemia type [E87.70] Anemia, unspecified type [D64.9] Hypervolemia associated with renal insufficiency [E87.70, N28.9]  Patient seen and evaluated during dialysis   HEMODIALYSIS FLOWSHEET:  Blood Flow Rate (mL/min): 400 mL/min Arterial Pressure (mmHg): -110 mmHg Venous Pressure (mmHg): 260 mmHg TMP (mmHg): 11 mmHg Ultrafiltration Rate (mL/min): 686 mL/min Dialysate Flow Rate (mL/min): 300 ml/min Dialysis Fluid Bolus: Normal Saline Bolus Amount (mL): 200 mL  Tolerating treatment well  Will obtain standing weight after treatment.    Objective:  Vital signs in last 24 hours:  Temp:  [97.6 F (36.4 C)-99.5 F (37.5 C)] 98.2 F (36.8 C) (05/08 0854) Pulse Rate:  [73-88] 82 (05/08 1030) Resp:  [15-32] 15 (05/08 1030) BP: (121-186)/(71-108) 186/97 (05/08 1030) SpO2:  [93 %-100 %] 100 % (05/08 1030) Weight:  [113.5 kg-117.2 kg] 115.2 kg (05/08 0854)  Weight change: -0.4 kg Filed Weights   05/13/22 0500 05/13/22 0528 05/13/22 0854  Weight: 113.5 kg 117.2 kg 115.2 kg    Intake/Output: I/O last 3 completed shifts: In: 2108.1 [P.O.:1673.2; I.V.:114.9; Blood:320] Out: 800 [Other:800]   Intake/Output this shift:  No intake/output data recorded.  Physical Exam: General: No acute distress  Head: [place]/AT, hearing intact  Eyes: Anicteric  Lungs:  Mild bilateral rales  Heart: Regular rate and rhythm  Abdomen:  Soft, nontender, obese  Extremities:  + peripheral edema.  Neurologic: Nonfocal, moving all four extremities  Skin: No lesions  Access: Left AVF +bruit +thrill, overlying suture    Basic Metabolic Panel: Recent Labs  Lab 05/11/22 0544 05/11/22 1622 05/12/22 0516 05/12/22 2338 05/13/22 0948  NA 137 134* 133* 135 135  K 6.6* 4.2 5.4* 4.3 4.3  CL 97* 91* 90* 95* 97*  CO2 25  29 29 28 28   GLUCOSE 136* 105* 92 109* 97  BUN 90* 48* 60* 51* 39*  CREATININE 12.67* 7.66* 8.94* 7.32* 5.47*  CALCIUM 8.5* 8.4* 8.2* 8.5* 8.2*  MG  --   --  1.9  --  1.7  PHOS 6.8*  --  6.6* 5.3* 3.8     Liver Function Tests: Recent Labs  Lab 05/12/22 2338  ALBUMIN 3.1*   No results for input(s): "LIPASE", "AMYLASE" in the last 168 hours. No results for input(s): "AMMONIA" in the last 168 hours.  CBC: Recent Labs  Lab 05/11/22 0051 05/11/22 0534 05/12/22 0516 05/12/22 2338 05/13/22 0948  WBC 7.4 7.5 6.5 5.8 4.5  HGB 7.8* 7.5* 6.9* 7.8* 7.4*  HCT 24.9* 24.7* 21.7* 24.5* 23.3*  MCV 83.0 83.7 81.6 81.9 81.5  PLT 232 244 194 192 188     Cardiac Enzymes: No results for input(s): "CKTOTAL", "CKMB", "CKMBINDEX", "TROPONINI" in the last 168 hours.  BNP: Invalid input(s): "POCBNP"  CBG: Recent Labs  Lab 05/11/22 0416  GLUCAP 75     Microbiology: Results for orders placed or performed during the hospital encounter of 05/11/22  MRSA Next Gen by PCR, Nasal     Status: None   Collection Time: 05/11/22  5:11 AM   Specimen: Nasal Mucosa; Nasal Swab  Result Value Ref Range Status   MRSA by PCR Next Gen NOT DETECTED NOT DETECTED Final    Comment: (NOTE) The GeneXpert MRSA Assay (FDA approved for NASAL specimens only), is one component of a comprehensive MRSA colonization surveillance program. It is not intended to diagnose MRSA infection nor to guide or  monitor treatment for MRSA infections. Test performance is not FDA approved in patients less than 36 years old. Performed at [facility], [address] [place], [place] [address]     Coagulation Studies: No results for input(s): "LABPROT", "INR" in the last 72 hours.  Urinalysis: No results for input(s): "COLORURINE", "LABSPEC", "PHURINE", "GLUCOSEU", "HGBUR", "BILIRUBINUR", "KETONESUR", "PROTEINUR", "UROBILINOGEN", "NITRITE", "LEUKOCYTESUR" in the last 72 hours.  Invalid input(s): "APPERANCEUR"     Imaging: No results found.   Medications:    sodium chloride      sodium chloride   Intravenous Once   albuterol  10 mg Nebulization Once   carbidopa-levodopa  1 tablet Oral TID   Chlorhexidine Gluconate Cloth  6 each Topical Daily   DULoxetine  30 mg Oral Daily   heparin  5,000 Units Subcutaneous Q8H   isosorbide mononitrate  60 mg Oral QPM   lanthanum  3,000 mg Oral TID WC   metoprolol succinate  50 mg Oral Daily   multivitamin  1 tablet Oral QHS   mouth rinse  15 mL Mouth Rinse 4 times per day   sodium chloride flush  3 mL Intravenous Q12H   sodium chloride flush  3 mL Intravenous Q12H   sodium zirconium cyclosilicate  10 g Oral Once   sodium chloride, acetaminophen **OR** acetaminophen, ALPRAZolam, HYDROmorphone (DILAUDID) injection, hydrOXYzine, lanthanum, ondansetron **OR** ondansetron (ZOFRAN) IV, mouth rinse, sodium chloride flush  Assessment/ Plan:  Patricia [name] is a 47 y.o. black female with end stage renal disease on hemodialysis secondary to minimal change disease, hypertension, congestive heart failure, hyperlipidemia, CVA, peripheral neuropathy, anemia, gout, GERD and anxiety who presents to [facility] on 05/11/2022 for Respiratory distress [R06.03] Hypervolemia, unspecified hypervolemia type [E87.70] Anemia, unspecified type [D64.9] Hypervolemia associated with renal insufficiency [E87.70, N28.9]  [facility] [address] MWF Left AVF 111.5kg  End Stage Renal Disease with volume overload, pulmonary edema and hyperkalemia.  Potassium corrected with dialysis. Tolerated treatment well yesterday, UF achieved. Receiving dialysis today to maintain outpatient schedule. Will obtain standing weight after treatment. Next treatment scheduled for Friday.   Hypertension with chronic kidney disease: Blood pressure 183/92 during dialysis. Continue metoprolol and Imdur.  Anemia with chronic kidney disease: Resume Mircera as an  outpatient. Hgb 7.4, will consider ESA when blood pressure controlled.  Secondary Hyperparathyroidism:  - patient gets cinacalcet three times a week.  - Lanthanum with meals.  - Phosphorus at gaol   LOS: 2   5/8/202410:35 AM

## 2022-05-13 NOTE — Progress Notes (Signed)
OT Cancellation Note  Patient Details Name: Patricia [name] MRN: [id number] DOB: 1975/06/05   Cancelled Treatment:    Reason Eval/Treat Not Completed: Patient at procedure or test/ unavailable. OT orders received, chart reviewed. Pt currently off the floor at HD. Will re-attempt as able.  Dorene Grebe  [facility] 05/13/2022, 9:13 AM

## 2022-05-13 NOTE — TOC Transition Note (Signed)
Transition of Care [facility]) - CM/SW Discharge Note   Patient Details  Name: Patricia [name] MRN: [id number] Date of Birth: [birth year]  Transition of Care [facility]) CM/SW Contact:  Chapman Fitch, RN Phone Number: 05/13/2022, 1:58 PM   Clinical Narrative:      Patient to discharge today Daughter at bedside to transport home Therapy eval pending Spoke with patient and she states that she does not feel like home health is indicated at this time.  She is aware that if she changes her mind to follow up with her primary care MD  Dimas Chyle dialysis liaison notified of discharge    Barriers to Discharge: Continued Medical Work up   Patient Goals and CMS Choice      Discharge Placement                         Discharge Plan and Services Additional resources added to the After Visit Summary for       Post Acute Care Choice:  (TBD)                               Social Determinants of Health (SDOH) Interventions SDOH Screenings   Food Insecurity: No Food Insecurity (05/12/2022)  Housing: Low Risk  (05/12/2022)  Transportation Needs: No Transportation Needs (05/12/2022)  Utilities: Not At Risk (05/12/2022)  Financial Resource Strain: Low Risk  (04/04/2018)  Physical Activity: Inactive (04/04/2018)  Social Connections: Somewhat Isolated (04/04/2018)  Stress: No Stress Concern Present (04/04/2018)  Tobacco Use: Low Risk  (05/11/2022)     Readmission Risk Interventions    05/13/2022    1:57 PM 05/12/2022    3:25 PM 03/15/2020    3:52 PM  Readmission Risk Prevention Plan  Transportation Screening  Complete Complete  Medication Review Oceanographer)  Complete Complete  PCP or Specialist appointment within 3-5 days of discharge  Complete   HRI or Home Care Consult Not Complete  Complete  HRI or Home Care Consult Pt Refusal Comments NA    SW Recovery Care/Counseling Consult  Complete Complete  Palliative Care Screening  Not Applicable Not Applicable  Skilled  Nursing Facility  Not Applicable Not Applicable

## 2022-05-13 NOTE — TOC Transition Note (Signed)
Transition of Care [facility]) - CM/SW Discharge Note   Patient Details  Name: Shanitta Pico MRN: [id number] Date of Birth: 09-07-1975  Transition of Care [facility]) CM/SW Contact:  Chapman Fitch, RN Phone Number: 05/13/2022, 1:56 PM   Clinical Narrative:      Patient to discharge today Daughter at bedside to transport home Therapy eval pending Spoke with patient and she states that she does not feel like home health is indicated at this time.  She is aware that if she changes her mind to follow up with her primary care MD   Barriers to Discharge: Continued Medical Work up   Patient Goals and CMS Choice      Discharge Placement                         Discharge Plan and Services Additional resources added to the After Visit Summary for       Post Acute Care Choice:  (TBD)                               Social Determinants of Health (SDOH) Interventions SDOH Screenings   Food Insecurity: No Food Insecurity (05/12/2022)  Housing: Low Risk  (05/12/2022)  Transportation Needs: No Transportation Needs (05/12/2022)  Utilities: Not At Risk (05/12/2022)  Financial Resource Strain: Low Risk  (04/04/2018)  Physical Activity: Inactive (04/04/2018)  Social Connections: Somewhat Isolated (04/04/2018)  Stress: No Stress Concern Present (04/04/2018)  Tobacco Use: Low Risk  (05/11/2022)     Readmission Risk Interventions    05/12/2022    3:25 PM 03/15/2020    3:52 PM  Readmission Risk Prevention Plan  Transportation Screening Complete Complete  Medication Review Oceanographer) Complete Complete  PCP or Specialist appointment within 3-5 days of discharge Complete   HRI or Home Care Consult  Complete  SW Recovery Care/Counseling Consult Complete Complete  Palliative Care Screening Not Applicable Not Applicable  Skilled Nursing Facility Not Applicable Not Applicable

## 2022-05-13 NOTE — Progress Notes (Signed)
Hemodialysis note  Received patient in bed to unit. Alert and oriented.  Informed consent signed and in chart.  Treatment initiated: 0913 Treatment completed: 1240  Patient tolerated well. Transported back to room, alert without acute distress.  Report given to patient's RN.   Access used: LUA AVF Access issues: none  Total UF removed: 1.5L Medication(s) given:  none  Post HD weight: 114.3 kg   [name]

## 2022-05-13 NOTE — Discharge Summary (Signed)
[facility] Discharge Summary   Patient: Patricia [name] ZOX:[id number]  PCP: Exie Parody  Date of admission: 05/11/2022   Date of discharge:  05/13/2022     Discharge Diagnoses:  Principal Problem:   Hypervolemia associated with renal insufficiency Active Problems:   ESRD on dialysis (HCC)   Anxiety and depression   Anemia   Hypertensive urgency   Hyperkalemia   Admitted From: Home Disposition:  Home   Recommendations for Outpatient Follow-up:  Follow-up with PCP in 1 week, continue to monitor BP at home and follow with PCP to titrate medications accordingly.  Started Imdur, skip the dose if systolic BP less than 140 mmHg. Follow with nephrology and continue hemodialysis as per schedule. Follow up LABS/TEST:  CBC in 1 month   Diet recommendation: Renal diet  Activity: The patient is advised to gradually reintroduce usual activities, as tolerated  Discharge Condition: stable  Code Status: Full code   History of present illness: As per the H and P dictated on admission Hospital Course:  Patricia [name] is a 47 y.o. female with medical history significant for hypertension, ESRD on hemodialysis, neuropathy, dystonia, depression, and anxiety who presents emergency department with worsening shortness of breath. Patient reports completing dialysis on 12/06/2022.  Her subsequent dialysis session was missed due to occlusion of her AVF which has since been declotted. As per patient's daughter patient's breathing got worse throughout the day and she was wheezing.  Most likely due to volume overload secondary to ESRD and missed hemodialysis. ED workup: Hyperkalemia potassium 5.7, anemia Macbeth 7.8, BNP 623, CXR: Patchy airspace disease in the left lower lung field, most likely in the lower lobe, suspicious for pneumonia  Assessment and Plan: # Acute hypoxic respiratory failure secondary to volume overload due to ESRD missed hemodialysis. S/p BiPAP,  breathing improved after hemodialysis.  Supplemental O2 inhalation was weaned off gradually.  Currently patient is saturating well on room air. # ESRD, HD MWF schedule; Patient missed hemodialysis on Friday due to AV fistula clotted. Hyperkalemia due to ESRD. Received urgent hemodialysis, hyperkalemia resolved Hyperphosphatemia due to ESRD, continue phosphate binders. Continue hemodialysis as per nephrology # Hypertensive urgency, BP Improved after hemodialysis. S/p nitroglycerin IV infusion, gradually weaned off. Started Imdur 60 mg p.o. nightly. Resumed Toprol-XL 50 mg p.o. daily home dose. Monitor BP at home and f/u with PCP to titrate medication accordingly # Anemia of chronic disease, due to ESRD Hb 7.8 on admission, Hb 6.9 on 5/7, 1 unit PRBC transfused. Iron profile, B12 and folic acid within normal range. Patient will benefit from Epogen, nephrology is following.  Posttransfusion Hb 7.8, remained stable.  Repeat CBC after few weeks. # Parkinson disease, continue Sinemet # Depression, continue Cymbalta # Obesity, body mass index is 39.78 kg/m.  Nutrition Interventions: Calorie restricted diet and daily exercise advised to lose body weight.  Lifestyle modification discussed.     - Patient was instructed, not to drive, operate heavy machinery, perform activities at heights, swimming or participation in water activities or provide baby sitting services while on Pain, Sleep and Anxiety Medications; until her outpatient Physician has advised to do so again.  - Also recommended to not to take more than prescribed Pain, Sleep and Anxiety Medications.  Patient was ambulatory without any assistance. On the day of the discharge the patient's vitals were stable, and no other acute medical condition were reported by patient. the patient was felt safe to be discharge at Home.  Consultants: Nephrology Procedures: Hemodialysis  Discharge  Exam: General: Appear in no distress, no Rash; Oral Mucosa  Clear, moist. Cardiovascular: S1 and S2 Present, no Murmur, Respiratory: normal respiratory effort, Bilateral Air entry present and no Crackles, no wheezes Abdomen: Bowel Sound present, Soft and no tenderness, no hernia Extremities: no Pedal edema, no calf tenderness Neurology: alert and oriented to time, place, and person affect appropriate.  Filed Weights   05/13/22 0500 05/13/22 0528 05/13/22 0854  Weight: 113.5 kg 117.2 kg 115.2 kg   Vitals:   05/13/22 1240 05/13/22 1314  BP: (!) 161/92 (!) 155/73  Pulse: 78 79  Resp: 13 18  Temp:  98.3 F (36.8 C)  SpO2: 100% 100%    DISCHARGE MEDICATION: Allergies as of 05/13/2022       Reactions   Nsaids Other (See Comments)   Cannot take due to Kidney disease/Kidney function Other Reaction(s): Unknown   Tolmetin Other (See Comments)   Cannot take due to Kidney Disease        Medication List     STOP taking these medications    traMADol 50 MG tablet Commonly known as: ULTRAM       TAKE these medications    acetaminophen 500 MG tablet Commonly known as: TYLENOL Take 1,000 mg by mouth every 6 (six) hours as needed for moderate pain.   allopurinol 100 MG tablet Commonly known as: ZYLOPRIM Take 100 mg by mouth at bedtime.   ALPRAZolam 1 MG tablet Commonly known as: XANAX Take 1 mg by mouth 2 (two) times daily as needed for anxiety or sleep.   carbidopa-levodopa 25-100 MG tablet Commonly known as: SINEMET IR TAKE 1 TABLET BY MOUTH THREE TIMES DAILY   Diclofenac Sodium 1 % Crea 1 gram qid prn   DULoxetine 30 MG capsule Commonly known as: Cymbalta Take 1 capsule (30 mg total) by mouth daily.   ezetimibe 10 MG tablet Commonly known as: ZETIA Take 10 mg by mouth daily.   gabapentin 100 MG capsule Commonly known as: Neurontin Take 3 capsules (300 mg total) by mouth 3 (three) times daily.   hydrOXYzine 25 MG tablet Commonly known as: ATARAX Take 25 mg by mouth every 6 (six) hours as needed for itching.    isosorbide mononitrate 60 MG 24 hr tablet Commonly known as: IMDUR Take 1 tablet (60 mg total) by mouth every evening. Skip the dose if systolic BP less than 140 mmHg   lanthanum 1000 MG chewable tablet Commonly known as: FOSRENOL Chew 1,000-3,000 mg by mouth See admin instructions. Take 3000 mg with each meal and 1000-2000 mg with each snack   lidocaine-prilocaine cream Commonly known as: EMLA 1gram tid prn   metoprolol succinate 50 MG 24 hr tablet Commonly known as: TOPROL-XL Take 50 mg by mouth every evening. Take with or immediately following a meal.   multivitamin Tabs tablet Take 1 tablet by mouth at bedtime.   omeprazole 40 MG capsule Commonly known as: PRILOSEC Take 40 mg by mouth in the morning and at bedtime.   rOPINIRole 1 MG tablet Commonly known as: REQUIP Take 1 tablet (1 mg total) by mouth at bedtime.   vortioxetine HBr 10 MG Tabs tablet Commonly known as: TRINTELLIX Take 10 mg by mouth daily.   Xeomin 50 units Solr injection Generic drug: incobotulinumtoxinA Inject 50 Units into the muscle every 3 (three) months.   zolpidem 5 MG tablet Commonly known as: AMBIEN Take 5 mg by mouth at bedtime.       Allergies  Allergen Reactions   Nsaids  Other (See Comments)    Cannot take due to Kidney disease/Kidney function  Other Reaction(s): Unknown   Tolmetin Other (See Comments)    Cannot take due to Kidney Disease   Discharge Instructions     Call MD for:  difficulty breathing, headache or visual disturbances   Complete by: As directed    Call MD for:  extreme fatigue   Complete by: As directed    Call MD for:  persistant dizziness or light-headedness   Complete by: As directed    Call MD for:  persistant nausea and vomiting   Complete by: As directed    Call MD for:  severe uncontrolled pain   Complete by: As directed    Call MD for:  temperature >100.4   Complete by: As directed    Diet - low sodium heart healthy   Complete by: As directed     Discharge instructions   Complete by: As directed    Follow-up with PCP in 1 week, continue to monitor BP at home and follow with PCP to titrate medications accordingly.  Started Imdur, skip the dose if systolic BP less than 140 mmHg. Follow with nephrology and continue hemodialysis as per schedule.   Increase activity slowly   Complete by: As directed        The results of significant diagnostics from this hospitalization (including imaging, microbiology, ancillary and laboratory) are listed below for reference.    Significant Diagnostic Studies: DG Chest Portable 1 View  Result Date: 05/11/2022 CLINICAL DATA:  Shortness of breath and chest pain. EXAM: PORTABLE CHEST 1 VIEW COMPARISON:  Portable chest 03/21/2022 FINDINGS: There is patchy airspace disease in the left lower lung field, most likely in the lower lobe as the left heart border remains well-defined. The remainder of the lungs are clear apart from central bronchial thickening. No pleural effusion is seen. There is mild chronic elevation of the right diaphragm. The cardiomediastinal silhouette is stable with mild cardiomegaly. No vascular congestion is seen. The thoracic cage is intact. Multiple overlying monitor wires. IMPRESSION: Patchy airspace disease in the left lower lung field, most likely in the lower lobe, suspicious for pneumonia. Clinical correlation and radiographic follow-up recommended preferably with PA and lateral views. Electronically Signed   By: Almira Bar M.D.   On: 05/11/2022 01:27    Microbiology: Recent Results (from the past 240 hour(s))  MRSA Next Gen by PCR, Nasal     Status: None   Collection Time: 05/11/22  5:11 AM   Specimen: Nasal Mucosa; Nasal Swab  Result Value Ref Range Status   MRSA by PCR Next Gen NOT DETECTED NOT DETECTED Final    Comment: (NOTE) The GeneXpert MRSA Assay (FDA approved for NASAL specimens only), is one component of a comprehensive MRSA colonization surveillance program. It  is not intended to diagnose MRSA infection nor to guide or monitor treatment for MRSA infections. Test performance is not FDA approved in patients less than 30 years old. Performed at [facility], [address] [place], [place] [address]      Labs: CBC: Recent Labs  Lab 05/11/22 0051 05/11/22 0534 05/12/22 0516 05/12/22 2338 05/13/22 0948  WBC 7.4 7.5 6.5 5.8 4.5  HGB 7.8* 7.5* 6.9* 7.8* 7.4*  HCT 24.9* 24.7* 21.7* 24.5* 23.3*  MCV 83.0 83.7 81.6 81.9 81.5  PLT 232 244 194 192 188   Basic Metabolic Panel: Recent Labs  Lab 05/11/22 0544 05/11/22 1622 05/12/22 0516 05/12/22 2338 05/13/22 0948  NA 137  134* 133* 135 135  K 6.6* 4.2 5.4* 4.3 4.3  CL 97* 91* 90* 95* 97*  CO2 25 29 29 28 28   GLUCOSE 136* 105* 92 109* 97  BUN 90* 48* 60* 51* 39*  CREATININE 12.67* 7.66* 8.94* 7.32* 5.47*  CALCIUM 8.5* 8.4* 8.2* 8.5* 8.2*  MG  --   --  1.9  --  1.7  PHOS 6.8*  --  6.6* 5.3* 3.8   Liver Function Tests: Recent Labs  Lab 05/12/22 2338  ALBUMIN 3.1*   No results for input(s): "LIPASE", "AMYLASE" in the last 168 hours. No results for input(s): "AMMONIA" in the last 168 hours. Cardiac Enzymes: No results for input(s): "CKTOTAL", "CKMB", "CKMBINDEX", "TROPONINI" in the last 168 hours. BNP (last 3 results) Recent Labs    05/11/22 0056  BNP 624.9*   CBG: Recent Labs  Lab 05/11/22 0416  GLUCAP 75    Time spent: 35 minutes  Signed:  Gillis Santa  [facility] 05/13/2022 1:47 PM

## 2022-05-13 NOTE — Progress Notes (Signed)
PT Cancellation Note  Patient Details Name: Patricia [name] MRN: [id number] DOB: [birth year]   Cancelled Treatment:    Reason Eval/Treat Not Completed: Other (comment). Pt currently off unit for dialysis. Will re-attempt.   Trinh Sanjose 05/13/2022, 9:18 AM Elizabeth Palau, PT, DPT, GCS [phone]

## 2022-05-18 ENCOUNTER — Ambulatory Visit (INDEPENDENT_AMBULATORY_CARE_PROVIDER_SITE_OTHER): Payer: Medicare Other | Admitting: Podiatry

## 2022-05-18 DIAGNOSIS — L97512 Non-pressure chronic ulcer of other part of right foot with fat layer exposed: Secondary | ICD-10-CM

## 2022-05-18 NOTE — Progress Notes (Signed)
Patient presents today to pick up custom molded foot orthotics recommended by Dr. Evans.   Orthotics were dispensed and fit was satisfactory. Reviewed instructions for break-in and wear. Written instructions given to patient.  Patient will follow up as needed.   

## 2022-05-18 NOTE — Progress Notes (Signed)
No chief complaint on file.   HPI: 47 y.o. female PMHx ESRD on hemodialysis presenting today for follow-up evaluation of ulcers to the plantar aspect of the bilateral feet.  Patient continues to do well.  No new complaints  Past Medical History:  Diagnosis Date   Acid reflux    Anemia 04/2019   REQUIRING BLOOD TRANSFUSIONS   Anxiety    Arthritis    CHF (congestive heart failure) (HCC)    Dyspnea    with exertion   Dysrhythmia    tachycardia   ESRD (end stage renal disease) (HCC)    [facility]   Facial twitching 11/2020   mouth twitch, eyes roll back, neurology started patient on low dose   Sinemet IR.   GI bleeding 12/2017   Gout    Headache(784.0)    "related to chemo; sometimes weekly" (09/12/2013)   Heart murmur    Systolic per Dr Orpah Cobb' notes   High cholesterol    History of blood transfusion "a couple"   "related to low counts"   Hypertension    Iron deficiency anemia    "get epogen shots q month" (02/20/2014)   MCGN (minimal change glomerulonephritis)    "using chemo to tx;  finished my last tx in 12/2013"   Neuropathy    Peritoneal dialysis status (HCC)    Stroke (HCC) 08/15/2017   ruled out that it was not a stroke    Past Surgical History:  Procedure Laterality Date   ABDOMINAL HYSTERECTOMY  2010   "laparoscopic"   ANKLE FRACTURE SURGERY Right 1994   AV FISTULA PLACEMENT Left 04/14/2016   Procedure: ARTERIOVENOUS (AV) FISTULA CREATION;  Surgeon: Chuck Hint, MD;  Location: [facility] OR;  Service: Vascular;  Laterality: Left;   AV FISTULA PLACEMENT Left 08/09/2017   Procedure: INSERTION OF ARTERIOVENOUS (AV) GORE-TEX GRAFT LEFT ARM;  Surgeon: Sherren Kerns, MD;  Location: [facility] OR;  Service: Vascular;  Laterality: Left;   AV FISTULA PLACEMENT Right 03/15/2020   Procedure: RIGHT ARM ARTERIOVENOUS (AV) FISTULA CREATION 1st Stage Basilic Transposition;  Surgeon: Nada Libman, MD;  Location: [facility] OR;  Service: Vascular;  Laterality: Right;   AV  FISTULA PLACEMENT Right 02/24/2021   Procedure: RIGHT ARM ARTERIOVENOUS  FISTULA CREATION;  Surgeon: Victorino Sparrow, MD;  Location: [facility] OR;  Service: Vascular;  Laterality: Right;  regional block   AVGG REMOVAL Left 08/11/2017   Procedure: REMOVAL OF ARTERIOVENOUS GORETEX GRAFT (AVGG);  Surgeon: Nada Libman, MD;  Location: [name] OR;  Service: Vascular;  Laterality: Left;   BASCILIC VEIN TRANSPOSITION Right 05/17/2020   Procedure: RIGHT UPPER EXTREMITY SECOND STAGE BASCILIC VEIN TRANSPOSITION;  Surgeon: Nada Libman, MD;  Location: [facility] OR;  Service: Vascular;  Laterality: Right;   BIOPSY  09/03/2017   Procedure: BIOPSY;  Surgeon: Kathi Der, MD;  Location: [facility] ENDOSCOPY;  Service: Gastroenterology;;   BIOPSY  12/24/2017   Procedure: BIOPSY;  Surgeon: Lemar Lofty., MD;  Location: [facility] ENDOSCOPY;  Service: Gastroenterology;;   CARDIAC CATHETERIZATION  2000's   COLONOSCOPY WITH PROPOFOL N/A 09/04/2017   Procedure: COLONOSCOPY WITH PROPOFOL;  Surgeon: Kerin Salen, MD;  Location: [facility] ENDOSCOPY;  Service: Gastroenterology;  Laterality: N/A;   ENTEROSCOPY N/A 12/24/2017   Procedure: ENTEROSCOPY;  Surgeon: Meridee Score Netty Starring., MD;  Location: [facility] ENDOSCOPY;  Service: Gastroenterology;  Laterality: N/A;   ESOPHAGOGASTRODUODENOSCOPY     ESOPHAGOGASTRODUODENOSCOPY (EGD) WITH PROPOFOL Left 06/04/2017   Procedure: ESOPHAGOGASTRODUODENOSCOPY (EGD) WITH PROPOFOL;  Surgeon: Willis Modena, MD;  Location: [facility] ENDOSCOPY;  Service: Endoscopy;  Laterality: Left;   ESOPHAGOGASTRODUODENOSCOPY (EGD) WITH PROPOFOL N/A 09/03/2017   Procedure: ESOPHAGOGASTRODUODENOSCOPY (EGD) WITH PROPOFOL;  Surgeon: Kathi Der, MD;  Location: [facility] ENDOSCOPY;  Service: Gastroenterology;  Laterality: N/A;   ESOPHAGOGASTRODUODENOSCOPY (EGD) WITH PROPOFOL N/A 04/27/2022   Procedure: ESOPHAGOGASTRODUODENOSCOPY (EGD) WITH PROPOFOL;  Surgeon: Regis Bill, MD;  Location: [facility] ENDOSCOPY;  Service: Endoscopy;  Laterality:  N/A;   FISTULA SUPERFICIALIZATION Left 04/02/2017   Procedure: FISTULA SUPERFICIALIZATION LEFT ARM;  Surgeon: Nada Libman, MD;  Location: [facility] OR;  Service: Vascular;  Laterality: Left;   FISTULA SUPERFICIALIZATION Right 04/14/2021   Procedure: RIGHT ARM FISTULA SUPERFICIALIZATION;  Surgeon: Victorino Sparrow, MD;  Location: [facility] OR;  Service: Vascular;  Laterality: Right;  PERIPHERAL NERVE BLOCK   FISTULOGRAM Left 04/07/2016   Procedure: FISTULOGRAM;  Surgeon: Maeola Harman, MD;  Location: [facility] OR;  Service: Vascular;  Laterality: Left;   FRACTURE SURGERY     right ankle   GIVENS CAPSULE STUDY N/A 10/29/2017   Procedure: GIVENS CAPSULE STUDY;  Surgeon: Charlott Rakes, MD;  Location: [facility] ENDOSCOPY;  Service: Endoscopy;  Laterality: N/A;   GIVENS CAPSULE STUDY N/A 12/24/2017   Procedure: GIVENS CAPSULE STUDY;  Surgeon: Lemar Lofty., MD;  Location: [facility] ENDOSCOPY;  Service: Gastroenterology;  Laterality: N/A;   GIVENS CAPSULE STUDY N/A 05/22/2018   Procedure: GIVENS CAPSULE STUDY;  Surgeon: Lemar Lofty., MD;  Location: [facility] ENDOSCOPY;  Service: Gastroenterology;  Laterality: N/A;   INSERTION OF DIALYSIS CATHETER Right 04/07/2016   Procedure: INSERTION OF DIALYSIS CATHETER;  Surgeon: Maeola Harman, MD;  Location: [facility] OR;  Service: Vascular;  Laterality: Right;   INSERTION OF DIALYSIS CATHETER Right 04/04/2017   Procedure: INSERTION OF DIALYSIS CATHETER;  Surgeon: Maeola Harman, MD;  Location: [facility] OR;  Service: Vascular;  Laterality: Right;   IR FLUORO GUIDE CV LINE LEFT  03/13/2020   IR US GUIDE VASC ACCESS LEFT  03/13/2020   LEFT HEART CATH AND CORONARY ANGIOGRAPHY N/A 05/12/2019   Procedure: LEFT HEART CATH AND CORONARY ANGIOGRAPHY;  Surgeon: Orpah Cobb, MD;  [facility] INVASIVE CV LAB;  Service: Cardiovascular;  Laterality: N/A;   LIGATION OF ARTERIOVENOUS  FISTULA Left 04/14/2016   Procedure: LIGATION OF ARTERIOVENOUS  FISTULA;  Surgeon: Chuck Hint, MD;  Location: [facility] OR;  Service: Vascular;  Laterality: Left;   LIGATION OF COMPETING BRANCHES OF ARTERIOVENOUS FISTULA Right 05/17/2020   Procedure: LIGATION OF COMPETING BRANCHES OF RIGHT ARM ARTERIOVENOUS FISTULA;  Surgeon: Nada Libman, MD;  Location: [facility] OR;  Service: Vascular;  Laterality: Right;   PATCH ANGIOPLASTY Left 06/19/2016   Procedure: PATCH ANGIOPLASTY LEFT ARTERIOVENOUS FISTULA;  Surgeon: Chuck Hint, MD;  Location: [facility] OR;  Service: Vascular;  Laterality: Left;   POLYPECTOMY  09/04/2017   Procedure: POLYPECTOMY;  Surgeon: Kerin Salen, MD;  Location: [facility] ENDOSCOPY;  Service: Gastroenterology;;   REVISON OF ARTERIOVENOUS FISTULA Left 06/19/2016   Procedure: REVISION SUPERFICIALIZATION OF BRACHIOCEPHALIC ARTERIOVENOUS FISTULA;  Surgeon: Chuck Hint, MD;  Location: [facility] OR;  Service: Vascular;  Laterality: Left;   UPPER EXTREMITY VENOGRAPHY Bilateral 02/19/2021   Procedure: UPPER EXTREMITY VENOGRAPHY;  Surgeon: Victorino Sparrow, MD;  Location: [facility] INVASIVE CV LAB;  Service: Cardiovascular;  Laterality: Bilateral;    Allergies  Allergen Reactions   Nsaids Other (See Comments)    Cannot take due to Kidney disease/Kidney function  Other Reaction(s): Unknown   Tolmetin Other (See Comments)    Cannot take due to  Kidney Disease    B/L feet 04/14/2022  Physical Exam: General: The patient is alert and oriented x3 in no acute distress.  Dermatology: Skin is warm, dry and supple bilateral lower extremities.  Ulcers to the bilateral feet have resolved and healed.  There is some hyperkeratotic preulcerative callus lesions noted to the bilateral feet.  Please see above noted photo  Vascular: Palpable pedal pulses bilaterally. Capillary refill within normal limits.  Negative for any significant edema or erythema  Neurological: Light touch and protective threshold diminished  Musculoskeletal Exam: No pedal deformities noted.  No prior  amputations   Assessment: 1.  Ulcer right fifth plantar MTP without exposed 2.  Preulcerative callus left forefoot 3.  ESRD on dialysis.  Not diabetic   Plan of Care:  -Patient evaluated - Excisional debridement of the hyperkeratotic callus tissue was performed today using a 312 scalpel without incident or bleeding. -Advised against going barefoot. - Custom molded accommodative tight diabetic insole was dispensed today.  Break-in and care instructions were provided. -Return to clinic 3 months   Felecia Shelling, DPM [facility]  Dr. Felecia Shelling, DPM    [address] [place], [place] [address]                Office [phone]  Fax [phone]

## 2022-05-22 NOTE — Progress Notes (Signed)
I did not see the patient, thank you

## 2022-06-01 ENCOUNTER — Other Ambulatory Visit: Payer: Self-pay | Admitting: Neurology

## 2022-06-02 ENCOUNTER — Other Ambulatory Visit: Payer: Self-pay | Admitting: Neurology

## 2022-06-05 ENCOUNTER — Emergency Department
Admission: EM | Admit: 2022-06-05 | Discharge: 2022-06-05 | Disposition: A | Discharge status: Home / Self Care | Payer: Medicare Other | Attending: Emergency Medicine | Admitting: Emergency Medicine

## 2022-06-05 ENCOUNTER — Other Ambulatory Visit: Payer: Self-pay

## 2022-06-05 DIAGNOSIS — N186 End stage renal disease: Secondary | ICD-10-CM | POA: Insufficient documentation

## 2022-06-05 DIAGNOSIS — Z992 Dependence on renal dialysis: Secondary | ICD-10-CM | POA: Diagnosis not present

## 2022-06-05 DIAGNOSIS — K921 Melena: Secondary | ICD-10-CM | POA: Diagnosis present

## 2022-06-05 DIAGNOSIS — D649 Anemia, unspecified: Secondary | ICD-10-CM | POA: Insufficient documentation

## 2022-06-05 LAB — CBC WITH DIFFERENTIAL/PLATELET
Abs Immature Granulocytes: 0.02 10*3/uL (ref 0.00–0.07)
Basophils Absolute: 0 10*3/uL (ref 0.0–0.1)
Basophils Relative: 1 %
Eosinophils Absolute: 0.2 10*3/uL (ref 0.0–0.5)
Eosinophils Relative: 4 %
HCT: 29.5 % — ABNORMAL LOW (ref 36.0–46.0)
Hemoglobin: 8.9 g/dL — ABNORMAL LOW (ref 12.0–15.0)
Immature Granulocytes: 0 %
Lymphocytes Relative: 29 %
Lymphs Abs: 1.7 10*3/uL (ref 0.7–4.0)
MCH: 25.7 pg — ABNORMAL LOW (ref 26.0–34.0)
MCHC: 30.2 g/dL (ref 30.0–36.0)
MCV: 85.3 fL (ref 80.0–100.0)
Monocytes Absolute: 0.4 10*3/uL (ref 0.1–1.0)
Monocytes Relative: 6 %
Neutro Abs: 3.5 10*3/uL (ref 1.7–7.7)
Neutrophils Relative %: 60 %
Platelets: 302 10*3/uL (ref 150–400)
RBC: 3.46 MIL/uL — ABNORMAL LOW (ref 3.87–5.11)
RDW: 17.1 % — ABNORMAL HIGH (ref 11.5–15.5)
WBC: 5.8 10*3/uL (ref 4.0–10.5)
nRBC: 0 % (ref 0.0–0.2)

## 2022-06-05 LAB — TYPE AND SCREEN
ABO/RH(D): O NEG
Antibody Screen: NEGATIVE

## 2022-06-05 LAB — COMPREHENSIVE METABOLIC PANEL
ALT: 9 U/L (ref 0–44)
AST: 22 U/L (ref 15–41)
Albumin: 4.2 g/dL (ref 3.5–5.0)
Alkaline Phosphatase: 101 U/L (ref 38–126)
Anion gap: 13 (ref 5–15)
BUN: 17 mg/dL (ref 6–20)
CO2: 32 mmol/L (ref 22–32)
Calcium: 8 mg/dL — ABNORMAL LOW (ref 8.9–10.3)
Chloride: 91 mmol/L — ABNORMAL LOW (ref 98–111)
Creatinine, Ser: 3.99 mg/dL — ABNORMAL HIGH (ref 0.44–1.00)
GFR, Estimated: 13 mL/min — ABNORMAL LOW (ref >60)
Glucose, Bld: 89 mg/dL (ref 70–99)
Potassium: 3.5 mmol/L (ref 3.5–5.1)
Sodium: 136 mmol/L (ref 135–145)
Total Bilirubin: 0.6 mg/dL (ref 0.3–1.2)
Total Protein: 8.8 g/dL — ABNORMAL HIGH (ref 6.5–8.1)

## 2022-06-05 NOTE — ED Triage Notes (Addendum)
Pt presents to ED with c/o of rectal bleeding. Pt is dialysis pt and sent from PCP for further eval of bleeding and hGB. Pt states last time her HgB was 7.3 and is concerned about is dropping further due to rectal bleeding. Pt denies blood thinner use.   Pt states HX of blood transfusions   Pt had full dialysis TX today.

## 2022-06-05 NOTE — Discharge Instructions (Signed)
Your hemoglobin today is 8.9.  Continue with your normal dialysis.  Have your blood count rechecked next week.  In the meantime return to the ER for any new, worsening, or persistent bleeding, abdominal pain, weakness or lightheadedness, shortness of breath, or any other new or worsening symptoms that concern you.

## 2022-06-05 NOTE — ED Provider Notes (Signed)
[facility] Provider Note    Event Date/Time   First MD Initiated Contact with Patient 06/05/22 1127     (approximate)   History   Rectal Bleeding   HPI  Patricia [name] is a 47 y.o. female with a history of ESRD on dialysis and anemia of chronic disease who presents with blood in her stool 1 episode about 3 days ago which she describes as being dark red in color.  The patient states that she has a prior history of blood in the stool for a long time.  She has had colonoscopies before that did not reveal anything.  She reports that her blood count was lower last week with a hemoglobin of 7.2 and she was advised by the dialysis doctor to come to the ED to get checked out today.  She had a dialysis session this morning which went normally.  The patient also states that she has had some increased shortness of breath as well as intermittent lightheadedness since being discharged from the hospital a few weeks ago, but that has not acutely changed in the last couple of days.  The patient denies any abdominal pain.  She has no nausea or vomiting.  I reviewed the past medical records.  The patient was admitted earlier this month and discharged on 5/8.  She presented with fluid overload due to ESRD but also was more anemic than baseline with a hemoglobin of 6.9 requiring 1 unit of PRBCs.  Posttransfusion her hemoglobin was 7.8.   Physical Exam   Triage Vital Signs: ED Triage Vitals  Enc Vitals Group     BP 06/05/22 1028 136/73     Pulse Rate 06/05/22 1028 73     Resp 06/05/22 1028 17     Temp 06/05/22 1028 98.4 F (36.9 C)     Temp Source 06/05/22 1028 Oral     SpO2 06/05/22 1028 99 %     Weight 06/05/22 1128 251 lb 15.8 oz (114.3 kg)     Height 06/05/22 1128 5\' 7"  (1.702 m)     Head Circumference --      Peak Flow --      Pain Score 06/05/22 1024 0     Pain Loc --      Pain Edu? --      Excl. in GC? --     Most recent vital signs: Vitals:    06/05/22 1028  BP: 136/73  Pulse: 73  Resp: 17  Temp: 98.4 F (36.9 C)  SpO2: 99%     General: Awake, no distress.  CV:  Good peripheral perfusion.  Resp:  Normal effort.  Abd:  No distention.  Other:  No conjunctival pallor.   ED Results / Procedures / Treatments   Labs (all labs ordered are listed, but only abnormal results are displayed) Labs Reviewed  CBC WITH DIFFERENTIAL/PLATELET - Abnormal; Notable for the following components:      Result Value   RBC 3.46 (*)    Hemoglobin 8.9 (*)    HCT 29.5 (*)    MCH 25.7 (*)    RDW 17.1 (*)    All other components within normal limits  COMPREHENSIVE METABOLIC PANEL - Abnormal; Notable for the following components:   Chloride 91 (*)    Creatinine, Ser 3.99 (*)    Calcium 8.0 (*)    Total Protein 8.8 (*)    GFR, Estimated 13 (*)    All other components within normal limits  TYPE  AND SCREEN     EKG    RADIOLOGY   PROCEDURES:  Critical Care performed: No  Procedures   MEDICATIONS ORDERED IN ED: Medications - No data to display   IMPRESSION / MDM / ASSESSMENT AND PLAN / ED COURSE  I reviewed the triage vital signs and the nursing notes.  47 year old female with PMH as noted above including ESRD status post dialysis today and anemia of chronic disease presents with 1 episode of possible blood in the stool several days ago and due to recent low hemoglobin at dialysis of 7.2.  Physical exam is unremarkable.  Differential diagnosis includes, but is not limited to, lower GI bleed, less likely upper GI bleed, anemia of chronic disease.  Initial labs here are reassuring.  Hemoglobin today is 8.9 which is a substantial improvement.  CMP shows no concerning electrolyte abnormalities.  The patient is reassured.  I discussed further workup with her.  She declines rectal exam at this time.  I also offered a chest x-ray given her recent shortness of breath although she states that is not acutely changed and also declines  chest x-ray.  The patient states that she is feeling relatively well.  She does not want any further workup in the ED and would like to go home.  Given that her hemoglobin is improved, her vital signs are stable, and she is overall well-appearing I feel that this is reasonable.  I explained and the patient expressed understanding that without doing a rectal exam and checking a stool sample I cannot rule out acute GI bleed.  I also offered to observe the patient in the ED for 4 to 6 hours and check a repeat hemoglobin but she declines this as well.  Patient's presentation is most consistent with acute complicated illness / injury requiring diagnostic workup.  I counseled patient on the results of her workup.  I gave her strict return precautions and she expresses understanding.  She will have her CBC rechecked at dialysis early next week and agrees to return for any worsening symptoms.   FINAL CLINICAL IMPRESSION(S) / ED DIAGNOSES   Final diagnoses:  Blood in stool     Rx / DC Orders   ED Discharge Orders     None        Note:  This document was prepared using Dragon voice recognition software and may include unintentional dictation errors.    Dionne Bucy, MD 06/05/22 1145

## 2022-06-25 ENCOUNTER — Other Ambulatory Visit: Payer: Self-pay | Admitting: Neurology

## 2022-06-25 NOTE — Telephone Encounter (Signed)
Last seen on 04/23/22 per note "Tolerating gabapentin 100 mg 3 times a day, titrating up to 900 mg maximum daily "  No follow up scheduled

## 2022-07-01 ENCOUNTER — Emergency Department
Admission: EM | Admit: 2022-07-01 | Discharge: 2022-07-01 | Disposition: A | Discharge status: Home / Self Care | Payer: Medicare Other | Attending: Emergency Medicine | Admitting: Emergency Medicine

## 2022-07-01 ENCOUNTER — Other Ambulatory Visit: Payer: Self-pay

## 2022-07-01 ENCOUNTER — Other Ambulatory Visit: Payer: Self-pay | Admitting: Neurology

## 2022-07-01 ENCOUNTER — Emergency Department: Payer: Medicare Other

## 2022-07-01 DIAGNOSIS — K649 Unspecified hemorrhoids: Secondary | ICD-10-CM | POA: Diagnosis not present

## 2022-07-01 DIAGNOSIS — N186 End stage renal disease: Secondary | ICD-10-CM | POA: Insufficient documentation

## 2022-07-01 DIAGNOSIS — S93402A Sprain of unspecified ligament of left ankle, initial encounter: Secondary | ICD-10-CM | POA: Insufficient documentation

## 2022-07-01 DIAGNOSIS — M25572 Pain in left ankle and joints of left foot: Secondary | ICD-10-CM | POA: Diagnosis present

## 2022-07-01 DIAGNOSIS — Z992 Dependence on renal dialysis: Secondary | ICD-10-CM | POA: Insufficient documentation

## 2022-07-01 DIAGNOSIS — I509 Heart failure, unspecified: Secondary | ICD-10-CM | POA: Insufficient documentation

## 2022-07-01 DIAGNOSIS — X58XXXA Exposure to other specified factors, initial encounter: Secondary | ICD-10-CM | POA: Insufficient documentation

## 2022-07-01 LAB — BASIC METABOLIC PANEL
Anion gap: 12 (ref 5–15)
BUN: 18 mg/dL (ref 6–20)
CO2: 31 mmol/L (ref 22–32)
Calcium: 8 mg/dL — ABNORMAL LOW (ref 8.9–10.3)
Chloride: 93 mmol/L — ABNORMAL LOW (ref 98–111)
Creatinine, Ser: 4.09 mg/dL — ABNORMAL HIGH (ref 0.44–1.00)
GFR, Estimated: 13 mL/min — ABNORMAL LOW (ref >60)
Glucose, Bld: 90 mg/dL (ref 70–99)
Potassium: 4.4 mmol/L (ref 3.5–5.1)
Sodium: 136 mmol/L (ref 135–145)

## 2022-07-01 LAB — CBC WITH DIFFERENTIAL/PLATELET
Abs Immature Granulocytes: 0.01 10*3/uL (ref 0.00–0.07)
Basophils Absolute: 0 10*3/uL (ref 0.0–0.1)
Basophils Relative: 1 %
Eosinophils Absolute: 0.2 10*3/uL (ref 0.0–0.5)
Eosinophils Relative: 4 %
HCT: 29.4 % — ABNORMAL LOW (ref 36.0–46.0)
Hemoglobin: 8.8 g/dL — ABNORMAL LOW (ref 12.0–15.0)
Immature Granulocytes: 0 %
Lymphocytes Relative: 26 %
Lymphs Abs: 1.4 10*3/uL (ref 0.7–4.0)
MCH: 26.3 pg (ref 26.0–34.0)
MCHC: 29.9 g/dL — ABNORMAL LOW (ref 30.0–36.0)
MCV: 87.8 fL (ref 80.0–100.0)
Monocytes Absolute: 0.5 10*3/uL (ref 0.1–1.0)
Monocytes Relative: 8 %
Neutro Abs: 3.3 10*3/uL (ref 1.7–7.7)
Neutrophils Relative %: 61 %
Platelets: 247 10*3/uL (ref 150–400)
RBC: 3.35 MIL/uL — ABNORMAL LOW (ref 3.87–5.11)
RDW: 17.6 % — ABNORMAL HIGH (ref 11.5–15.5)
WBC: 5.5 10*3/uL (ref 4.0–10.5)
nRBC: 0.4 % — ABNORMAL HIGH (ref 0.0–0.2)

## 2022-07-01 MED ORDER — HYDROCORTISONE (PERIANAL) 2.5 % EX CREA: 1.0000 | TOPICAL_CREAM | Freq: Two times a day (BID) | CUTANEOUS | 0 refills | Status: DC

## 2022-07-01 MED ORDER — POLYETHYLENE GLYCOL 3350 17 G PO PACK: 17.0000 g | PACK | Freq: Every day | ORAL | 0 refills | Status: DC

## 2022-07-01 NOTE — ED Triage Notes (Signed)
Pt states that she has been walking different since Saturday and worse Sunday, pt has neuropathy in her feet so states that she doesn't feel pain and is uncertain if she injured it, pt also states for the past week her hemorrhoids have been hanging out and she has been tucking them back in

## 2022-07-01 NOTE — Telephone Encounter (Signed)
No benefit from short trial of Sinemet 25/100 3 times daily  based on May 2023 visit,   Will not refill Sinemet

## 2022-07-01 NOTE — Discharge Instructions (Addendum)
Use ankle brace for extra stability.  Your blood levels were normal today you do not need a transfusion.  Use hemorrhoid cream as directed and MiraLAX daily.  See your primary doctor for follow-up appointment this week.

## 2022-07-01 NOTE — ED Provider Notes (Signed)
[facility] Provider Note    Event Date/Time   First MD Initiated Contact with Patient 07/01/22 1220     (approximate)   History   Ankle Pain and Hemorrhoids   HPI  Patricia [name] is a 47 y.o. female   Past medical history of CHF and end-stage renal disease on hemodialysis Monday Wednesday Friday, neuropathy, who presents to the emergency department with some left ankle instability with unknown injury for the last several days.  She has no pain she has been ambulating well.  She denies fevers, chills, redness or swelling to the ankle.  She also states that she has some hemorrhoids that hang out after pushing and straining for bowel movements.  Some scant bleeding associated.  Itching and mild pain.  She has had anemia associated with her kidney disease in the past requiring transfusion since she wonders whether her rectal bleeding has contributed to her low hemoglobin level today.       Physical Exam   Triage Vital Signs: ED Triage Vitals [07/01/22 1021]  Enc Vitals Group     BP (!) 160/75     Pulse Rate 76     Resp 16     Temp 98.4 F (36.9 C)     Temp Source Oral     SpO2 99 %     Weight 251 lb 15.8 oz (114.3 kg)     Height 5\' 7"  (1.702 m)     Head Circumference      Peak Flow      Pain Score 0     Pain Loc      Pain Edu?      Excl. in GC?     Most recent vital signs: Vitals:   07/01/22 1021  BP: (!) 160/75  Pulse: 76  Resp: 16  Temp: 98.4 F (36.9 C)  SpO2: 99%    General: Awake, no distress.  CV:  Good peripheral perfusion.  Resp:  Normal effort.  Abd:  No distention.  Other:  Awake alert comfortable.  Hypertensive otherwise vital signs normal.  Breathing comfortably.  Ankle appears normal, she is ranging well, there is no overlying infectious changes.  Neurovascular intact.  Rectal exam shows no prolapsed hemorrhoids, no obvious bleeding.   ED Results / Procedures / Treatments   Labs (all labs ordered are  listed, but only abnormal results are displayed) Labs Reviewed  CBC WITH DIFFERENTIAL/PLATELET - Abnormal; Notable for the following components:      Result Value   RBC 3.35 (*)    Hemoglobin 8.8 (*)    HCT 29.4 (*)    MCHC 29.9 (*)    RDW 17.6 (*)    nRBC 0.4 (*)    All other components within normal limits  BASIC METABOLIC PANEL - Abnormal; Notable for the following components:   Chloride 93 (*)    Creatinine, Ser 4.09 (*)    Calcium 8.0 (*)    GFR, Estimated 13 (*)    All other components within normal limits     I ordered and reviewed the above labs they are notable for H&H is at baseline.  PROCEDURES:  Critical Care performed: No  Procedures   MEDICATIONS ORDERED IN ED: Medications - No data to display  IMPRESSION / MDM / ASSESSMENT AND PLAN / ED COURSE  I reviewed the triage vital signs and the nursing notes.  Patient's presentation is most consistent with acute presentation with potential threat to life or bodily function.  Differential diagnosis includes, but is not limited to, acute blood loss anemia, lower GI bleeding, ankle fracture or dislocation, ligamentous injury, septic joint    MDM:    In terms of her ankle instability, she is able to range, no signs of infection, and x-rays negative for fracture dislocations.  Ankle brace given for stability and she will follow-up with PMD.  In terms of her hemorrhoids, there is no active bleeding, and her H&H is stable.  I prescribed her Anusol and MiraLAX and she will follow-up with PMD.        FINAL CLINICAL IMPRESSION(S) / ED DIAGNOSES   Final diagnoses:  Sprain of left ankle, unspecified ligament, initial encounter  Hemorrhoids, unspecified hemorrhoid type     Rx / DC Orders   ED Discharge Orders          Ordered    hydrocortisone (ANUSOL-HC) 2.5 % rectal cream  2 times daily        07/01/22 1318    polyethylene glycol (MIRALAX) 17 g packet  Daily         07/01/22 1318             Note:  This document was prepared using Dragon voice recognition software and may include unintentional dictation errors.    Pilar Jarvis, MD 07/01/22 1537

## 2022-07-01 NOTE — ED Provider Triage Note (Signed)
Emergency Medicine Provider Triage Evaluation Note  Patricia [name] , a 47 y.o. female  was evaluated in triage.  Pt complains of left ankle problems and possible rectal prolapse.  Review of Systems  Positive:  Negative:   Physical Exam  BP (!) 160/75 (BP Location: Left Arm)   Pulse 76   Temp 98.4 F (36.9 C) (Oral)   Resp 16   Ht 5\' 7"  (1.702 m)   Wt 114.3 kg   SpO2 99%   BMI 39.47 kg/m  Gen:   Awake, no distress   Resp:  Normal effort  MSK:   Moves extremities without difficulty  Other:    Medical Decision Making  Medically screening exam initiated at 10:22 AM.  Appropriate orders placed.  Patricia [name] was informed that the remainder of the evaluation will be completed by another provider, this initial triage assessment does not replace that evaluation, and the importance of remaining in the ED until their evaluation is complete.  Dialysis patient   Patricia [name], Patricia [name] 07/01/22 1023

## 2022-07-15 ENCOUNTER — Emergency Department: Payer: Medicare Other

## 2022-07-15 ENCOUNTER — Other Ambulatory Visit: Payer: Self-pay

## 2022-07-15 DIAGNOSIS — N186 End stage renal disease: Secondary | ICD-10-CM | POA: Diagnosis not present

## 2022-07-15 DIAGNOSIS — D649 Anemia, unspecified: Secondary | ICD-10-CM | POA: Insufficient documentation

## 2022-07-15 DIAGNOSIS — R42 Dizziness and giddiness: Secondary | ICD-10-CM | POA: Diagnosis present

## 2022-07-15 DIAGNOSIS — Z992 Dependence on renal dialysis: Secondary | ICD-10-CM | POA: Diagnosis not present

## 2022-07-15 LAB — CBC WITH DIFFERENTIAL/PLATELET
Abs Immature Granulocytes: 0 10*3/uL (ref 0.00–0.07)
Basophils Absolute: 0 10*3/uL (ref 0.0–0.1)
Basophils Relative: 1 %
Eosinophils Absolute: 0.2 10*3/uL (ref 0.0–0.5)
Eosinophils Relative: 4 %
HCT: 24 % — ABNORMAL LOW (ref 36.0–46.0)
Hemoglobin: 7.3 g/dL — ABNORMAL LOW (ref 12.0–15.0)
Immature Granulocytes: 0 %
Lymphocytes Relative: 31 %
Lymphs Abs: 1.3 10*3/uL (ref 0.7–4.0)
MCH: 26 pg (ref 26.0–34.0)
MCHC: 30.4 g/dL (ref 30.0–36.0)
MCV: 85.4 fL (ref 80.0–100.0)
Monocytes Absolute: 0.4 10*3/uL (ref 0.1–1.0)
Monocytes Relative: 9 %
Neutro Abs: 2.4 10*3/uL (ref 1.7–7.7)
Neutrophils Relative %: 55 %
Platelets: 214 10*3/uL (ref 150–400)
RBC: 2.81 MIL/uL — ABNORMAL LOW (ref 3.87–5.11)
RDW: 17 % — ABNORMAL HIGH (ref 11.5–15.5)
WBC: 4.3 10*3/uL (ref 4.0–10.5)
nRBC: 0.5 % — ABNORMAL HIGH (ref 0.0–0.2)

## 2022-07-15 LAB — COMPREHENSIVE METABOLIC PANEL
ALT: 16 U/L (ref 0–44)
AST: 41 U/L (ref 15–41)
Albumin: 3.6 g/dL (ref 3.5–5.0)
Alkaline Phosphatase: 82 U/L (ref 38–126)
Anion gap: 13 (ref 5–15)
BUN: 25 mg/dL — ABNORMAL HIGH (ref 6–20)
CO2: 31 mmol/L (ref 22–32)
Calcium: 8.4 mg/dL — ABNORMAL LOW (ref 8.9–10.3)
Chloride: 95 mmol/L — ABNORMAL LOW (ref 98–111)
Creatinine, Ser: 5.15 mg/dL — ABNORMAL HIGH (ref 0.44–1.00)
GFR, Estimated: 10 mL/min — ABNORMAL LOW (ref >60)
Glucose, Bld: 109 mg/dL — ABNORMAL HIGH (ref 70–99)
Potassium: 3.6 mmol/L (ref 3.5–5.1)
Sodium: 139 mmol/L (ref 135–145)
Total Bilirubin: 0.4 mg/dL (ref 0.3–1.2)
Total Protein: 7.2 g/dL (ref 6.5–8.1)

## 2022-07-15 LAB — TROPONIN I (HIGH SENSITIVITY): Troponin I (High Sensitivity): 13 ng/L (ref <18)

## 2022-07-15 NOTE — ED Triage Notes (Signed)
Pt to ED via POV c/o dizziness and SOB for a few days. Pt reports this has been going on for a while bnut got worse today after dialysis. Pt goes to dialysis MWF and is pretty consistent with it. Pt feeling weaker than usual, hx of chronic anemia as well. Pt also reports having blood in stool since Sunday.

## 2022-07-16 ENCOUNTER — Emergency Department
Admission: EM | Admit: 2022-07-16 | Discharge: 2022-07-16 | Disposition: A | Discharge status: Home / Self Care | Payer: Medicare Other | Attending: Emergency Medicine | Admitting: Emergency Medicine

## 2022-07-16 DIAGNOSIS — D649 Anemia, unspecified: Secondary | ICD-10-CM

## 2022-07-16 LAB — PREPARE RBC (CROSSMATCH)

## 2022-07-16 MED ORDER — PROMETHAZINE HCL 25 MG PO TABS
12.5000 mg | ORAL_TABLET | Freq: Once | ORAL | Status: AC
  Administered 2022-07-16: 12.5 mg via ORAL
  Filled 2022-07-16: qty 1

## 2022-07-16 MED ORDER — ALPRAZOLAM 0.5 MG PO TABS
1.0000 mg | ORAL_TABLET | Freq: Once | ORAL | Status: AC
  Administered 2022-07-16: 1 mg via ORAL
  Filled 2022-07-16: qty 2

## 2022-07-16 MED ORDER — SODIUM CHLORIDE 0.9 % IV SOLN
10.0000 mL/h | Freq: Once | INTRAVENOUS | Status: AC
  Administered 2022-07-16: 10 mL/h via INTRAVENOUS

## 2022-07-16 MED ORDER — ONDANSETRON HCL 4 MG/2ML IJ SOLN
4.0000 mg | Freq: Once | INTRAMUSCULAR | Status: AC
  Administered 2022-07-16: 4 mg via INTRAVENOUS
  Filled 2022-07-16: qty 2

## 2022-07-16 MED ORDER — SODIUM CHLORIDE 0.9 % IV SOLN: 12.5000 mg | Freq: Four times a day (QID) | INTRAVENOUS | Status: DC | PRN

## 2022-07-16 MED ORDER — MORPHINE SULFATE (PF) 4 MG/ML IV SOLN
4.0000 mg | Freq: Once | INTRAVENOUS | Status: AC
  Administered 2022-07-16: 4 mg via INTRAVENOUS
  Filled 2022-07-16: qty 1

## 2022-07-16 NOTE — ED Provider Notes (Signed)
[facility] Provider Note    Event Date/Time   First MD Initiated Contact with Patient 07/16/22 0031     (approximate)   History   Dizziness and Shortness of Breath   HPI  Patricia [name] is a 47 y.o. female with a history of ESRD on dialysis and anemia of chronic disease who presents with lightheadedness over the last several days, occurring constantly but worse with exertion, and associated with some shortness of breath and generalized weakness.  The patient has also had some chest pain described as pressure.  She states she feels like this when her anemia has gotten worse.  The patient states that she was at dialysis yesterday which went normally.  She denies any cough or fever.  She has no vomiting or diarrhea.  The patient does report some bright red blood in the stool that she believes is due to a hemorrhoid.  However she denies any dark blood or black stools.  She denies any other abnormal bleeding or bruising.  I reviewed the past medical records.  The patient was admitted in May due to fluid overload and worsening anemia.  She was subsequently seen in the ED for a low hemoglobin which resolved spontaneously, and again on 6/26 for ankle pain.    Physical Exam   Triage Vital Signs: ED Triage Vitals [07/15/22 2105]  Enc Vitals Group     BP (!) 141/65     Pulse Rate 78     Resp 20     Temp 98.3 F (36.8 C)     Temp Source Oral     SpO2 100 %     Weight 233 lb (105.7 kg)     Height 5\' 7"  (1.702 m)     Head Circumference      Peak Flow      Pain Score 4     Pain Loc      Pain Edu?      Excl. in GC?     Most recent vital signs: Vitals:   07/16/22 0420 07/16/22 0700  BP: (!) 189/87 (!) 209/103  Pulse: 84 84  Resp: (!) 29   Temp: 99.4 F (37.4 C)   SpO2: 100%      General: Awake, no distress.  CV:  Good peripheral perfusion.  Resp:  Normal effort.  Abd:  No distention.  Other:  Conjunctival pallor.   ED Results /  Procedures / Treatments   Labs (all labs ordered are listed, but only abnormal results are displayed) Labs Reviewed  COMPREHENSIVE METABOLIC PANEL - Abnormal; Notable for the following components:      Result Value   Chloride 95 (*)    Glucose, Bld 109 (*)    BUN 25 (*)    Creatinine, Ser 5.15 (*)    Calcium 8.4 (*)    GFR, Estimated 10 (*)    All other components within normal limits  CBC WITH DIFFERENTIAL/PLATELET - Abnormal; Notable for the following components:   RBC 2.81 (*)    Hemoglobin 7.3 (*)    HCT 24.0 (*)    RDW 17.0 (*)    nRBC 0.5 (*)    All other components within normal limits  URINALYSIS, ROUTINE W REFLEX MICROSCOPIC  TYPE AND SCREEN  PREPARE RBC (CROSSMATCH)  TROPONIN I (HIGH SENSITIVITY)     EKG  ED ECG REPORT I, Dionne Bucy, the attending physician, personally viewed and interpreted this ECG.  Date: 07/16/2022 EKG Time: 2115 Rate: 76 Rhythm:  normal sinus rhythm QRS Axis: normal Intervals: Prolonged QTc ST/T Wave abnormalities: normal Narrative Interpretation: no evidence of acute ischemia    RADIOLOGY  Chest x-ray: I independently viewed and interpreted the images; there is no focal consolidation or edema   PROCEDURES:  Critical Care performed: No  Procedures   MEDICATIONS ORDERED IN ED: Medications  0.9 %  sodium chloride infusion (10 mL/hr Intravenous New Bag/Given 07/16/22 0414)  morphine (PF) 4 MG/ML injection 4 mg (4 mg Intravenous Given 07/16/22 0239)  ondansetron (ZOFRAN) injection 4 mg (4 mg Intravenous Given 07/16/22 0239)  promethazine (PHENERGAN) tablet 12.5 mg (12.5 mg Oral Given 07/16/22 0431)  ALPRAZolam (XANAX) tablet 1 mg (1 mg Oral Given 07/16/22 0431)     IMPRESSION / MDM / ASSESSMENT AND PLAN / ED COURSE  I reviewed the triage vital signs and the nursing notes.  47 year old female with PMH as noted above presents with generalized weakness and lightheadedness over the last several days associated with some  shortness of breath and chest discomfort.  On exam she is hypertensive with otherwise normal vital signs.  Exam is otherwise unremarkable for acute findings.  EKG is nonischemic.  Differential diagnosis includes, but is not limited to, worsening anemia, dehydration, electrolyte abnormality, other metabolic disturbance, less likely cardiac etiology.  Initial troponin is negative.  Based on the duration of the symptoms and the reassuring EKG, there is no indication for repeat.  Hemoglobin is 7.3.  The patient has run between 7 and 9 in the last couple months, but previous her baseline hemoglobin was 11.  Electrolytes are unremarkable.  Overall I suspect that the symptoms are most likely related to anemia.  Given the hemoglobin of 7.3 with acute symptoms I will give a unit of blood.  Patient's presentation is most consistent with acute presentation with potential threat to life or bodily function.  The patient is on the cardiac monitor to evaluate for evidence of arrhythmia and/or significant heart rate changes.   ----------------------------------------- 7:30 AM on 07/16/2022 -----------------------------------------  The patient is receiving blood.  I anticipate she will be stable for discharge home once the transfusion is completed.  I have signed her out to the oncoming ED physician Dr. Roxan Hockey.  FINAL CLINICAL IMPRESSION(S) / ED DIAGNOSES   Final diagnoses:  Symptomatic anemia     Rx / DC Orders   ED Discharge Orders     None        Note:  This document was prepared using Dragon voice recognition software and may include unintentional dictation errors.    Dionne Bucy, MD 07/16/22 0730

## 2022-07-16 NOTE — ED Notes (Signed)
Patient reports she has not had any BP meds since 07/14/2022

## 2022-07-17 LAB — TYPE AND SCREEN
ABO/RH(D): O NEG
Antibody Screen: NEGATIVE
Unit division: 0

## 2022-07-17 LAB — BPAM RBC
Blood Product Expiration Date: 202407172359
ISSUE DATE / TIME: 202407110352
Unit Type and Rh: 9500

## 2022-07-20 ENCOUNTER — Other Ambulatory Visit: Payer: Self-pay

## 2022-07-20 ENCOUNTER — Encounter: Payer: Self-pay | Admitting: Emergency Medicine

## 2022-07-20 ENCOUNTER — Inpatient Hospital Stay
Admission: EM | Admit: 2022-07-20 | Discharge: 2022-07-24 | DRG: 291 | Disposition: A | Discharge status: Home Health | Payer: Medicare Other | Attending: Obstetrics and Gynecology | Admitting: Obstetrics and Gynecology

## 2022-07-20 ENCOUNTER — Emergency Department: Payer: Medicare Other

## 2022-07-20 DIAGNOSIS — J810 Acute pulmonary edema: Secondary | ICD-10-CM | POA: Diagnosis present

## 2022-07-20 DIAGNOSIS — J9691 Respiratory failure, unspecified with hypoxia: Secondary | ICD-10-CM | POA: Diagnosis present

## 2022-07-20 DIAGNOSIS — G629 Polyneuropathy, unspecified: Secondary | ICD-10-CM | POA: Diagnosis present

## 2022-07-20 DIAGNOSIS — Z79899 Other long term (current) drug therapy: Secondary | ICD-10-CM

## 2022-07-20 DIAGNOSIS — E1140 Type 2 diabetes mellitus with diabetic neuropathy, unspecified: Secondary | ICD-10-CM | POA: Diagnosis present

## 2022-07-20 DIAGNOSIS — N2581 Secondary hyperparathyroidism of renal origin: Secondary | ICD-10-CM | POA: Diagnosis present

## 2022-07-20 DIAGNOSIS — E1122 Type 2 diabetes mellitus with diabetic chronic kidney disease: Secondary | ICD-10-CM | POA: Diagnosis present

## 2022-07-20 DIAGNOSIS — K219 Gastro-esophageal reflux disease without esophagitis: Secondary | ICD-10-CM | POA: Diagnosis present

## 2022-07-20 DIAGNOSIS — M1990 Unspecified osteoarthritis, unspecified site: Secondary | ICD-10-CM | POA: Diagnosis present

## 2022-07-20 DIAGNOSIS — I132 Hypertensive heart and chronic kidney disease with heart failure and with stage 5 chronic kidney disease, or end stage renal disease: Secondary | ICD-10-CM | POA: Diagnosis not present

## 2022-07-20 DIAGNOSIS — I160 Hypertensive urgency: Secondary | ICD-10-CM | POA: Diagnosis present

## 2022-07-20 DIAGNOSIS — F419 Anxiety disorder, unspecified: Secondary | ICD-10-CM | POA: Diagnosis present

## 2022-07-20 DIAGNOSIS — I151 Hypertension secondary to other renal disorders: Secondary | ICD-10-CM

## 2022-07-20 DIAGNOSIS — Z90710 Acquired absence of both cervix and uterus: Secondary | ICD-10-CM

## 2022-07-20 DIAGNOSIS — G609 Hereditary and idiopathic neuropathy, unspecified: Secondary | ICD-10-CM | POA: Diagnosis present

## 2022-07-20 DIAGNOSIS — Z8249 Family history of ischemic heart disease and other diseases of the circulatory system: Secondary | ICD-10-CM

## 2022-07-20 DIAGNOSIS — I10 Essential (primary) hypertension: Secondary | ICD-10-CM | POA: Diagnosis present

## 2022-07-20 DIAGNOSIS — Z888 Allergy status to other drugs, medicaments and biological substances status: Secondary | ICD-10-CM

## 2022-07-20 DIAGNOSIS — N186 End stage renal disease: Secondary | ICD-10-CM

## 2022-07-20 DIAGNOSIS — Z886 Allergy status to analgesic agent status: Secondary | ICD-10-CM

## 2022-07-20 DIAGNOSIS — E1129 Type 2 diabetes mellitus with other diabetic kidney complication: Secondary | ICD-10-CM

## 2022-07-20 DIAGNOSIS — E669 Obesity, unspecified: Secondary | ICD-10-CM | POA: Diagnosis present

## 2022-07-20 DIAGNOSIS — Z7682 Awaiting organ transplant status: Secondary | ICD-10-CM

## 2022-07-20 DIAGNOSIS — I5032 Chronic diastolic (congestive) heart failure: Secondary | ICD-10-CM | POA: Diagnosis present

## 2022-07-20 DIAGNOSIS — J189 Pneumonia, unspecified organism: Secondary | ICD-10-CM | POA: Diagnosis present

## 2022-07-20 DIAGNOSIS — E875 Hyperkalemia: Secondary | ICD-10-CM | POA: Diagnosis present

## 2022-07-20 DIAGNOSIS — R0603 Acute respiratory distress: Secondary | ICD-10-CM | POA: Diagnosis not present

## 2022-07-20 DIAGNOSIS — D509 Iron deficiency anemia, unspecified: Secondary | ICD-10-CM | POA: Diagnosis present

## 2022-07-20 DIAGNOSIS — M109 Gout, unspecified: Secondary | ICD-10-CM | POA: Diagnosis present

## 2022-07-20 DIAGNOSIS — Z833 Family history of diabetes mellitus: Secondary | ICD-10-CM

## 2022-07-20 DIAGNOSIS — I959 Hypotension, unspecified: Secondary | ICD-10-CM | POA: Diagnosis not present

## 2022-07-20 DIAGNOSIS — E7800 Pure hypercholesterolemia, unspecified: Secondary | ICD-10-CM | POA: Diagnosis present

## 2022-07-20 DIAGNOSIS — Z6839 Body mass index (BMI) 39.0-39.9, adult: Secondary | ICD-10-CM

## 2022-07-20 DIAGNOSIS — D631 Anemia in chronic kidney disease: Secondary | ICD-10-CM | POA: Diagnosis present

## 2022-07-20 DIAGNOSIS — Z992 Dependence on renal dialysis: Secondary | ICD-10-CM

## 2022-07-20 DIAGNOSIS — Z8673 Personal history of transient ischemic attack (TIA), and cerebral infarction without residual deficits: Secondary | ICD-10-CM

## 2022-07-20 DIAGNOSIS — Z1152 Encounter for screening for COVID-19: Secondary | ICD-10-CM

## 2022-07-20 LAB — CBC
HCT: 26.3 % — ABNORMAL LOW (ref 36.0–46.0)
Hemoglobin: 8.1 g/dL — ABNORMAL LOW (ref 12.0–15.0)
MCH: 26.6 pg (ref 26.0–34.0)
MCHC: 30.8 g/dL (ref 30.0–36.0)
MCV: 86.5 fL (ref 80.0–100.0)
Platelets: 258 10*3/uL (ref 150–400)
RBC: 3.04 MIL/uL — ABNORMAL LOW (ref 3.87–5.11)
RDW: 16.5 % — ABNORMAL HIGH (ref 11.5–15.5)
WBC: 6.8 10*3/uL (ref 4.0–10.5)
nRBC: 0.3 % — ABNORMAL HIGH (ref 0.0–0.2)

## 2022-07-20 LAB — COMPREHENSIVE METABOLIC PANEL
ALT: 9 U/L (ref 0–44)
AST: 43 U/L — ABNORMAL HIGH (ref 15–41)
Albumin: 3.5 g/dL (ref 3.5–5.0)
Alkaline Phosphatase: 80 U/L (ref 38–126)
Anion gap: 13 (ref 5–15)
BUN: 70 mg/dL — ABNORMAL HIGH (ref 6–20)
CO2: 26 mmol/L (ref 22–32)
Calcium: 9.1 mg/dL (ref 8.9–10.3)
Chloride: 102 mmol/L (ref 98–111)
Creatinine, Ser: 9.9 mg/dL — ABNORMAL HIGH (ref 0.44–1.00)
GFR, Estimated: 4 mL/min — ABNORMAL LOW (ref >60)
Glucose, Bld: 101 mg/dL — ABNORMAL HIGH (ref 70–99)
Potassium: 6.1 mmol/L — ABNORMAL HIGH (ref 3.5–5.1)
Sodium: 141 mmol/L (ref 135–145)
Total Bilirubin: 0.4 mg/dL (ref 0.3–1.2)
Total Protein: 7.3 g/dL (ref 6.5–8.1)

## 2022-07-20 LAB — SARS CORONAVIRUS 2 BY RT PCR: SARS Coronavirus 2 by RT PCR: NEGATIVE

## 2022-07-20 LAB — PROCALCITONIN: Procalcitonin: 0.3 ng/mL

## 2022-07-20 LAB — HEPATITIS B SURFACE ANTIGEN: Hepatitis B Surface Ag: NONREACTIVE

## 2022-07-20 MED ORDER — ALLOPURINOL 100 MG PO TABS
100.0000 mg | ORAL_TABLET | Freq: Every day | ORAL | Status: DC
  Administered 2022-07-20 – 2022-07-23 (×4): 100 mg via ORAL
  Filled 2022-07-20 (×5): qty 1

## 2022-07-20 MED ORDER — SODIUM CHLORIDE 0.9 % IV SOLN
500.0000 mg | INTRAVENOUS | Status: AC
  Administered 2022-07-20 – 2022-07-22 (×3): 500 mg via INTRAVENOUS
  Filled 2022-07-20 (×4): qty 5

## 2022-07-20 MED ORDER — GUAIFENESIN 100 MG/5ML PO LIQD
10.0000 mL | ORAL | Status: DC | PRN
  Administered 2022-07-20 – 2022-07-24 (×5): 10 mL via ORAL
  Filled 2022-07-20 (×5): qty 10

## 2022-07-20 MED ORDER — METOPROLOL SUCCINATE ER 50 MG PO TB24
50.0000 mg | ORAL_TABLET | Freq: Every evening | ORAL | Status: DC
  Administered 2022-07-20 – 2022-07-22 (×3): 50 mg via ORAL
  Filled 2022-07-20 (×3): qty 1

## 2022-07-20 MED ORDER — EZETIMIBE 10 MG PO TABS
10.0000 mg | ORAL_TABLET | Freq: Every day | ORAL | Status: DC
  Administered 2022-07-21 – 2022-07-24 (×4): 10 mg via ORAL
  Filled 2022-07-20 (×4): qty 1

## 2022-07-20 MED ORDER — ONDANSETRON HCL 4 MG/2ML IJ SOLN
4.0000 mg | Freq: Four times a day (QID) | INTRAMUSCULAR | Status: DC | PRN
  Administered 2022-07-21 – 2022-07-24 (×4): 4 mg via INTRAVENOUS
  Filled 2022-07-20 (×5): qty 2

## 2022-07-20 MED ORDER — ACETAMINOPHEN 650 MG RE SUPP: 650.0000 mg | Freq: Four times a day (QID) | RECTAL | Status: DC | PRN

## 2022-07-20 MED ORDER — ONDANSETRON HCL 4 MG PO TABS: 4.0000 mg | ORAL_TABLET | Freq: Four times a day (QID) | ORAL | Status: DC | PRN

## 2022-07-20 MED ORDER — BENZONATATE 100 MG PO CAPS
100.0000 mg | ORAL_CAPSULE | Freq: Once | ORAL | Status: AC
  Administered 2022-07-20: 100 mg via ORAL
  Filled 2022-07-20: qty 1

## 2022-07-20 MED ORDER — ISOSORBIDE MONONITRATE ER 30 MG PO TB24
60.0000 mg | ORAL_TABLET | Freq: Every evening | ORAL | Status: DC
  Administered 2022-07-20 – 2022-07-24 (×5): 60 mg via ORAL
  Filled 2022-07-20 (×5): qty 2

## 2022-07-20 MED ORDER — LIDOCAINE HCL (PF) 1 % IJ SOLN: 5.0000 mL | INTRAMUSCULAR | Status: DC | PRN

## 2022-07-20 MED ORDER — CARBIDOPA-LEVODOPA 25-100 MG PO TABS
1.0000 | ORAL_TABLET | Freq: Three times a day (TID) | ORAL | Status: DC
  Administered 2022-07-20 – 2022-07-24 (×11): 1 via ORAL
  Filled 2022-07-20 (×12): qty 1

## 2022-07-20 MED ORDER — IPRATROPIUM-ALBUTEROL 0.5-2.5 (3) MG/3ML IN SOLN
3.0000 mL | Freq: Once | RESPIRATORY_TRACT | Status: AC
  Administered 2022-07-20: 3 mL via RESPIRATORY_TRACT
  Filled 2022-07-20: qty 3

## 2022-07-20 MED ORDER — SODIUM CHLORIDE 0.9 % IV SOLN
2.0000 g | INTRAVENOUS | Status: AC
  Administered 2022-07-20 – 2022-07-24 (×5): 2 g via INTRAVENOUS
  Filled 2022-07-20 (×6): qty 20

## 2022-07-20 MED ORDER — VORTIOXETINE HBR 5 MG PO TABS
10.0000 mg | ORAL_TABLET | Freq: Every day | ORAL | Status: DC
  Administered 2022-07-21 – 2022-07-24 (×3): 10 mg via ORAL
  Filled 2022-07-20 (×5): qty 2

## 2022-07-20 MED ORDER — ALPRAZOLAM 0.5 MG PO TABS
1.0000 mg | ORAL_TABLET | Freq: Two times a day (BID) | ORAL | Status: DC | PRN
  Administered 2022-07-20 – 2022-07-23 (×4): 1 mg via ORAL
  Filled 2022-07-20 (×5): qty 2

## 2022-07-20 MED ORDER — MORPHINE SULFATE (PF) 2 MG/ML IV SOLN
2.0000 mg | INTRAVENOUS | Status: DC | PRN
  Administered 2022-07-20 – 2022-07-23 (×9): 2 mg via INTRAVENOUS
  Filled 2022-07-20 (×9): qty 1

## 2022-07-20 MED ORDER — SODIUM CHLORIDE 0.9% FLUSH
3.0000 mL | Freq: Two times a day (BID) | INTRAVENOUS | Status: DC
  Administered 2022-07-20 – 2022-07-24 (×8): 3 mL via INTRAVENOUS

## 2022-07-20 MED ORDER — CHLORHEXIDINE GLUCONATE CLOTH 2 % EX PADS: 6.0000 | MEDICATED_PAD | Freq: Every day | CUTANEOUS | Status: DC

## 2022-07-20 MED ORDER — HYDRALAZINE HCL 20 MG/ML IJ SOLN
10.0000 mg | Freq: Once | INTRAMUSCULAR | Status: AC
  Administered 2022-07-20: 10 mg via INTRAVENOUS
  Filled 2022-07-20: qty 0.5

## 2022-07-20 MED ORDER — LIDOCAINE-PRILOCAINE 2.5-2.5 % EX CREA: 1.0000 | TOPICAL_CREAM | CUTANEOUS | Status: DC | PRN

## 2022-07-20 MED ORDER — HEPARIN SODIUM (PORCINE) 5000 UNIT/ML IJ SOLN
5000.0000 [IU] | Freq: Three times a day (TID) | INTRAMUSCULAR | Status: DC
  Administered 2022-07-20 – 2022-07-24 (×12): 5000 [IU] via SUBCUTANEOUS
  Filled 2022-07-20 (×12): qty 1

## 2022-07-20 MED ORDER — HEPARIN SODIUM (PORCINE) 1000 UNIT/ML DIALYSIS: 1000.0000 [IU] | INTRAMUSCULAR | Status: DC | PRN

## 2022-07-20 MED ORDER — HYDROCOD POLI-CHLORPHE POLI ER 10-8 MG/5ML PO SUER
5.0000 mL | Freq: Two times a day (BID) | ORAL | Status: DC | PRN
  Administered 2022-07-20: 5 mL via ORAL
  Filled 2022-07-20: qty 5

## 2022-07-20 MED ORDER — ALTEPLASE 2 MG IJ SOLR: 2.0000 mg | Freq: Once | INTRAMUSCULAR | Status: DC | PRN

## 2022-07-20 MED ORDER — ROPINIROLE HCL 1 MG PO TABS
1.0000 mg | ORAL_TABLET | Freq: Every day | ORAL | Status: DC
  Administered 2022-07-20 – 2022-07-23 (×4): 1 mg via ORAL
  Filled 2022-07-20 (×5): qty 1

## 2022-07-20 MED ORDER — ANTICOAGULANT SODIUM CITRATE 4% (200MG/5ML) IV SOLN: 5.0000 mL | Status: DC | PRN

## 2022-07-20 MED ORDER — DULOXETINE HCL 30 MG PO CPEP
30.0000 mg | ORAL_CAPSULE | Freq: Every day | ORAL | Status: DC
  Administered 2022-07-21 – 2022-07-24 (×4): 30 mg via ORAL
  Filled 2022-07-20 (×4): qty 1

## 2022-07-20 MED ORDER — LORAZEPAM 1 MG PO TABS
0.5000 mg | ORAL_TABLET | Freq: Once | ORAL | Status: AC
  Administered 2022-07-20: 0.5 mg via ORAL
  Filled 2022-07-20: qty 1

## 2022-07-20 MED ORDER — GUAIFENESIN ER 600 MG PO TB12
600.0000 mg | ORAL_TABLET | Freq: Two times a day (BID) | ORAL | Status: DC
  Administered 2022-07-20 – 2022-07-24 (×8): 600 mg via ORAL
  Filled 2022-07-20 (×8): qty 1

## 2022-07-20 MED ORDER — ACETAMINOPHEN 325 MG PO TABS
650.0000 mg | ORAL_TABLET | Freq: Four times a day (QID) | ORAL | Status: DC | PRN
  Administered 2022-07-20 – 2022-07-21 (×2): 650 mg via ORAL
  Filled 2022-07-20: qty 2

## 2022-07-20 MED ORDER — GABAPENTIN 300 MG PO CAPS
300.0000 mg | ORAL_CAPSULE | Freq: Three times a day (TID) | ORAL | Status: DC
  Administered 2022-07-20 – 2022-07-24 (×11): 300 mg via ORAL
  Filled 2022-07-20 (×11): qty 1

## 2022-07-20 MED ORDER — PENTAFLUOROPROP-TETRAFLUOROETH EX AERO: 1.0000 | INHALATION_SPRAY | CUTANEOUS | Status: DC | PRN

## 2022-07-20 MED ORDER — HYDRALAZINE HCL 20 MG/ML IJ SOLN
10.0000 mg | Freq: Four times a day (QID) | INTRAMUSCULAR | Status: DC | PRN
  Administered 2022-07-21 – 2022-07-23 (×2): 10 mg via INTRAVENOUS
  Filled 2022-07-20: qty 0.5
  Filled 2022-07-20 (×2): qty 1

## 2022-07-20 MED ORDER — ACETAMINOPHEN 325 MG PO TABS
ORAL_TABLET | ORAL | Status: AC
  Filled 2022-07-20: qty 2

## 2022-07-20 NOTE — Assessment & Plan Note (Signed)
-   SSI, very sensitive

## 2022-07-20 NOTE — ED Notes (Signed)
RN unable to obtain blood in triage. RN notified phlebotomy.

## 2022-07-20 NOTE — ED Provider Notes (Signed)
[facility] Provider Note    Event Date/Time   First MD Initiated Contact with Patient 07/20/22 1109     (approximate)   History   Cough   HPI  Patricia [name] is a 47 y.o. female with a history of end-stage renal disease, CHF presents with cough, shortness of breath which started overnight.  She denies fevers but reports she did feel warm.  Patient was due for dialysis today     Physical Exam   Triage Vital Signs: ED Triage Vitals  Encounter Vitals Group     BP 07/20/22 1049 (!) 189/92     Systolic BP Percentile --      Diastolic BP Percentile --      Pulse Rate 07/20/22 1049 72     Resp 07/20/22 1049 20     Temp 07/20/22 1049 98.4 F (36.9 C)     Temp Source 07/20/22 1049 Oral     SpO2 07/20/22 1049 98 %     Weight 07/20/22 1047 105.7 kg (233 lb 0.4 oz)     Height 07/20/22 1113 1.702 m (5\' 7" )     Head Circumference --      Peak Flow --      Pain Score 07/20/22 1047 0     Pain Loc --      Pain Education --      Exclude from Growth Chart --     Most recent vital signs: Vitals:   07/20/22 1049  BP: (!) 189/92  Pulse: 72  Resp: 20  Temp: 98.4 F (36.9 C)  SpO2: 98%     General: Awake, no distress.  CV:  Good peripheral perfusion.  Resp:  Mild tachypnea, scattered wheezes bibasilar Rales Abd:  No distention.  Other:     ED Results / Procedures / Treatments   Labs (all labs ordered are listed, but only abnormal results are displayed) Labs Reviewed  CBC - Abnormal; Notable for the following components:      Result Value   RBC 3.04 (*)    Hemoglobin 8.1 (*)    HCT 26.3 (*)    RDW 16.5 (*)    nRBC 0.3 (*)    All other components within normal limits  SARS CORONAVIRUS 2 BY RT PCR  COMPREHENSIVE METABOLIC PANEL     EKG  ED ECG REPORT I, Jene Every, the attending physician, personally viewed and interpreted this ECG.  Date: 07/20/2022  Rhythm: normal sinus rhythm QRS Axis: normal Intervals:  normal ST/T Wave abnormalities: normal Narrative Interpretation: no evidence of acute ischemia    RADIOLOGY Chest x-ray viewed interpret by me, concerning for pulmonary edema    PROCEDURES:  Critical Care performed: no  Procedures   MEDICATIONS ORDERED IN ED: Medications  ipratropium-albuterol (DUONEB) 0.5-2.5 (3) MG/3ML nebulizer solution 3 mL (3 mLs Nebulization Given 07/20/22 1137)  ipratropium-albuterol (DUONEB) 0.5-2.5 (3) MG/3ML nebulizer solution 3 mL (3 mLs Nebulization Given 07/20/22 1136)  LORazepam (ATIVAN) tablet 0.5 mg (0.5 mg Oral Given 07/20/22 1225)     IMPRESSION / MDM / ASSESSMENT AND PLAN / ED COURSE  I reviewed the triage vital signs and the nursing notes. Patient's presentation is most consistent with acute presentation with potential threat to life or bodily function.  Patient presents with shortness of breath, mildly tachypneic with scattered wheezing and rales.  Given end-stage renal disease, differential includes pulmonary edema, pneumonia, bronchospasm  With DuoNebs with little improvement.  Lab work demonstrates normal white blood cell count, patient  is afebrile, negative COVID test  X-ray suspicious primarily for pulmonary edema, consulted with Dr. Wynelle Link of nephrology who will arrange for dialysis, have consulted the hospitalist for admission        FINAL CLINICAL IMPRESSION(S) / ED DIAGNOSES   Final diagnoses:  Acute pulmonary edema (HCC)     Rx / DC Orders   ED Discharge Orders     None        Note:  This document was prepared using Dragon voice recognition software and may include unintentional dictation errors.   Jene Every, MD 07/20/22 1309

## 2022-07-20 NOTE — Assessment & Plan Note (Signed)
-   Continue home regimen 

## 2022-07-20 NOTE — Assessment & Plan Note (Signed)
Hemoglobin is stable at this time with no evidence of bleeding on examination  - Management per nephrology

## 2022-07-20 NOTE — Assessment & Plan Note (Signed)
Patient is presenting with several day history of fever/chills, 1 day of malaise/fatigue and development of nonproductive cough and shortness of breath over the last 12 to 15 hours.  Chest x-ray with bilateral opacities concerning for pneumonia versus edema.  Given ESRD on HD, hyperkalemia, and elevated BUN, I wonder if this could be secondary to inadequate UF with last dialysis, however clinical history is worrisome for infectious etiology as well.  No history of asthma or COPD.  - Continue supplemental oxygen to maintain oxygen saturation above 88% - Wean as tolerated - RVP and procalcitonin pending - Start empiric ceftriaxone and azithromycin; discontinue if procalcitonin is negative - Dialysis this afternoon per nephrology

## 2022-07-20 NOTE — ED Notes (Signed)
See triage note  Presents with cough  States she developed this cough during the night   Denies any fever  But also has been SOB  with this cough

## 2022-07-20 NOTE — Progress Notes (Addendum)
Hemodialysis note  Received patient in bed to unit. Alert and oriented.  Informed consent signed and in chart.  Treatment initiated: 1508 Treatment completed: 1849  Patient tolerated well. Transported back to room, alert without acute distress.  Report given to patient's RN.   Access used: RUA AVF Access issues: none  Total UF removed: 2L Medication(s) given:  Hydralazine 10 mg IV x2, Robitussin PO, Tylenol 650 mg tb PO Post HD VS: BP: 164/84 Post HD weight: 188.4 kg   [name]

## 2022-07-20 NOTE — Progress Notes (Signed)
[facility]  ROUNDING NOTE   Subjective:   Patricia [name] was admitted to [facility] on 07/20/2022 for Acute respiratory distress [R06.03]  Last hemodialysis treatment was Friday, July 12.   Patient presents with complains of shortness of breath, nonproductive cough and subjective fevers.   Objective:  Vital signs in last 24 hours:  Temp:  [98.4 F (36.9 C)] 98.4 F (36.9 C) (07/15 1049) Pulse Rate:  [70-72] 70 (07/15 1336) Resp:  [20] 20 (07/15 1336) BP: (180-189)/(90-92) 180/90 (07/15 1336) SpO2:  [98 %] 98 % (07/15 1336) Weight:  [105.7 kg] 105.7 kg (07/15 1113)  Weight change:  Filed Weights   07/20/22 1047 07/20/22 1113  Weight: 105.7 kg 105.7 kg    Intake/Output: No intake/output data recorded.   Intake/Output this shift:  No intake/output data recorded.  Physical Exam: General: NAD, laying in stretcher  Head: Normocephalic, atraumatic. Moist oral mucosal membranes  Eyes: Anicteric, PERRL  Neck: Supple, trachea midline  Lungs:     Heart: Regular rate and rhythm  Abdomen:  Soft, nontender,   Extremities:  no peripheral edema.  Neurologic: Nonfocal, moving all four extremities  Skin: No lesions  Access: Left AVF    Basic Metabolic Panel: Recent Labs  Lab 07/15/22 2110 07/20/22 1227  NA 139 141  K 3.6 6.1*  CL 95* 102  CO2 31 26  GLUCOSE 109* 101*  BUN 25* 70*  CREATININE 5.15* 9.90*  CALCIUM 8.4* 9.1    Liver Function Tests: Recent Labs  Lab 07/15/22 2110 07/20/22 1227  AST 41 43*  ALT 16 9  ALKPHOS 82 80  BILITOT 0.4 0.4  PROT 7.2 7.3  ALBUMIN 3.6 3.5   No results for input(s): "LIPASE", "AMYLASE" in the last 168 hours. No results for input(s): "AMMONIA" in the last 168 hours.  CBC: Recent Labs  Lab 07/15/22 2110 07/20/22 1227  WBC 4.3 6.8  NEUTROABS 2.4  --   HGB 7.3* 8.1*  HCT 24.0* 26.3*  MCV 85.4 86.5  PLT 214 258    Cardiac Enzymes: No results for input(s): "CKTOTAL", "CKMB", "CKMBINDEX",  "TROPONINI" in the last 168 hours.  BNP: Invalid input(s): "POCBNP"  CBG: No results for input(s): "GLUCAP" in the last 168 hours.  Microbiology: Results for orders placed or performed during the hospital encounter of 07/20/22  SARS Coronavirus 2 by RT PCR (hospital order, performed in [facility] hospital lab) *cepheid single result test* Anterior Nasal Swab     Status: None   Collection Time: 07/20/22 10:53 AM   Specimen: Anterior Nasal Swab  Result Value Ref Range Status   SARS Coronavirus 2 by RT PCR NEGATIVE NEGATIVE Final    Comment: (NOTE) SARS-CoV-2 target nucleic acids are NOT DETECTED.  The SARS-CoV-2 RNA is generally detectable in upper and lower respiratory specimens during the acute phase of infection. The lowest concentration of SARS-CoV-2 viral copies this assay can detect is 250 copies / mL. A negative result does not preclude SARS-CoV-2 infection and should not be used as the sole basis for treatment or other patient management decisions.  A negative result may occur with improper specimen collection / handling, submission of specimen other than nasopharyngeal swab, presence of viral mutation(s) within the areas targeted by this assay, and inadequate number of viral copies (<250 copies / mL). A negative result must be combined with clinical observations, patient history, and epidemiological information.  Fact Sheet for Patients:   RoadLapTop.co.za  Fact Sheet for Healthcare Providers: http://kim-miller.com/  This test is  not yet approved or  cleared by the [place] and has been authorized for detection and/or diagnosis of SARS-CoV-2 by FDA under an Emergency Use Authorization (EUA).  This EUA will remain in effect (meaning this test can be used) for the duration of the COVID-19 declaration under Section 564(b)(1) of the Act, 21 U.S.C. section 360bbb-3(b)(1), unless the authorization is terminated  or revoked sooner.  Performed at [facility], [address] [place], [place] [address]     Coagulation Studies: No results for input(s): "LABPROT", "INR" in the last 72 hours.  Urinalysis: No results for input(s): "COLORURINE", "LABSPEC", "PHURINE", "GLUCOSEU", "HGBUR", "BILIRUBINUR", "KETONESUR", "PROTEINUR", "UROBILINOGEN", "NITRITE", "LEUKOCYTESUR" in the last 72 hours.  Invalid input(s): "APPERANCEUR"    Imaging: DG Chest 2 View  Result Date: 07/20/2022 CLINICAL DATA:  Provided history: Painful cough. Additional history provided: Cough. Wheezing. Chills. EXAM: CHEST - 2 VIEW COMPARISON:  Prior chest radiographs 07/15/2022 and earlier. FINDINGS: Heart size within normal limits. Patchy and hazy pulmonary opacities bilaterally, new from the prior examination of 07/15/2022. Chronic elevation of the right hemidiaphragm. No evidence of pleural effusion or pneumothorax. No acute osseous abnormality identified. Dextrocurvature of the imaged thoracolumbar spine. A vascular stent projects in the right subclavian/axillary region. Residual contrast within the colon. IMPRESSION: 1. Patchy and hazy pulmonary opacities bilaterally, new from the prior examination of 07/15/2022. Findings may reflect infection and/or edema. 2. Chronic elevation of the right hemidiaphragm. 3. Dextrocurvature of the imaged thoracolumbar spine. Electronically Signed   By: Jackey Loge D.O.   On: 07/20/2022 11:24     Medications:    anticoagulant sodium citrate     azithromycin     cefTRIAXone (ROCEPHIN)  IV      allopurinol  100 mg Oral QHS   carbidopa-levodopa  1 tablet Oral TID   [START ON 07/21/2022] Chlorhexidine Gluconate Cloth  6 each Topical Q0600   [START ON 07/21/2022] DULoxetine  30 mg Oral Daily   ezetimibe  10 mg Oral Daily   gabapentin  300 mg Oral TID   heparin  5,000 Units Subcutaneous Q8H   isosorbide mononitrate  60 mg Oral QPM   metoprolol succinate  50 mg Oral QPM   rOPINIRole  1 mg  Oral QHS   sodium chloride flush  3 mL Intravenous Q12H   vortioxetine HBr  10 mg Oral Daily   acetaminophen **OR** acetaminophen, ALPRAZolam, alteplase, anticoagulant sodium citrate, heparin, lidocaine (PF), lidocaine-prilocaine, ondansetron **OR** ondansetron (ZOFRAN) IV, pentafluoroprop-tetrafluoroeth  Assessment/ Plan:  Patricia [name] is a 47 y.o.  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   End Stage Renal Disease on hemodialysis: place on hemodialysis and continue MWF schedule.  Hypertension with chronic kidney disease: 186/97 - elevated due to coughing.  Anemia of chronic kidney disease: hemoglobin 8.1. Mircera as outpatient. Hold ESA due to elevated blood pressure.  Secondary Hyperparathyroidism: Continue Lanthanum.    LOS: 0 Carolyn Maniscalco 7/15/20242:50 PM

## 2022-07-20 NOTE — Assessment & Plan Note (Addendum)
-   Nephrology consulted; appreciate their recommendations - Dialysis planned for today

## 2022-07-20 NOTE — H&P (Signed)
History and Physical    Patient: Patricia [name] WUJ:[id number] DOB: Jun 30, 1975 DOA: 07/20/2022 DOS: the patient was seen and examined on 07/20/2022 PCP: Patricia Duff, PA-C  Patient coming from: Home  Chief Complaint:  Chief Complaint  Patient presents with   Cough   HPI: Patricia [name] is a 47 y.o. female with medical history significant of ESRD on HD 2/2 [facility] on organ transplant list, hypertension, iron deficiency anemia, anemia of chronic kidney disease, HFpEF, who presents to the ED due to cough and shortness of breath.  [name] states that for the last couple days, she has been feeling feverish with chills.  Then yesterday, she noted that she felt generalized malaise with fatigue.  Then yesterday evening into the night, she developed shortness of breath with a nonproductive cough that has been progressively worsening.  She endorses chest pain only while coughing.  She endorses nausea but denies any vomiting, abdominal pain.  She notes that her feet were swollen yesterday, however swelling did not go any higher than her feet.  She states that her shortness of breath at this time feels different than her previous episodes of pulmonary edema.  She has not missed any dialysis sessions.  ED course: On arrival to the ED, patient was hypertensive at 189/92 with heart rate of 72.  She was saturating at 98% on room air.  She was afebrile at 98.4.  Initial workup demonstrated potassium of 6.1, bicarb 26, BUN 70, AST 43, hemoglobin of 8.1 and platelets of 258.  COVID-19 PCR negative.Chest x-ray with patchy/hazy pulmonary opacities bilaterally that is new compared to 5 days prior.  Nephrology consulted.  [facility] contacted for admission.  Review of Systems: As mentioned in the history of present illness. All other systems reviewed and are negative.  Past Medical History:  Diagnosis Date   Acid reflux    Anemia 04/2019   REQUIRING BLOOD TRANSFUSIONS   Anxiety    Arthritis     CHF (congestive heart failure) (HCC)    Dyspnea    with exertion   Dysrhythmia    tachycardia   ESRD (end stage renal disease) (HCC)    [facility]   Facial twitching 11/2020   mouth twitch, eyes roll back, neurology started patient on low dose   Sinemet IR.   GI bleeding 12/2017   Gout    Headache(784.0)    "related to chemo; sometimes weekly" (09/12/2013)   Heart murmur    Systolic per Dr Orpah Cobb' notes   High cholesterol    History of blood transfusion "a couple"   "related to low counts"   Hypertension    Iron deficiency anemia    "get epogen shots q month" (02/20/2014)   MCGN (minimal change glomerulonephritis)    "using chemo to tx;  finished my last tx in 12/2013"   Neuropathy    Peritoneal dialysis status (HCC)    Stroke (HCC) 08/15/2017   ruled out that it was not a stroke   Past Surgical History:  Procedure Laterality Date   ABDOMINAL HYSTERECTOMY  2010   "laparoscopic"   ANKLE FRACTURE SURGERY Right 1994   AV FISTULA PLACEMENT Left 04/14/2016   Procedure: ARTERIOVENOUS (AV) FISTULA CREATION;  Surgeon: Chuck Hint, MD;  Location: [facility] OR;  Service: Vascular;  Laterality: Left;   AV FISTULA PLACEMENT Left 08/09/2017   Procedure: INSERTION OF ARTERIOVENOUS (AV) GORE-TEX GRAFT LEFT ARM;  Surgeon: Sherren Kerns, MD;  Location: [facility] OR;  Service: Vascular;  Laterality:  Left;   AV FISTULA PLACEMENT Right 03/15/2020   Procedure: RIGHT ARM ARTERIOVENOUS (AV) FISTULA CREATION 1st Stage Basilic Transposition;  Surgeon: Nada Libman, MD;  Location: [facility] OR;  Service: Vascular;  Laterality: Right;   AV FISTULA PLACEMENT Right 02/24/2021   Procedure: RIGHT ARM ARTERIOVENOUS  FISTULA CREATION;  Surgeon: Victorino Sparrow, MD;  Location: [facility] OR;  Service: Vascular;  Laterality: Right;  regional block   AVGG REMOVAL Left 08/11/2017   Procedure: REMOVAL OF ARTERIOVENOUS GORETEX GRAFT (AVGG);  Surgeon: Nada Libman, MD;  Location: [facility] OR;  Service: Vascular;   Laterality: Left;   BASCILIC VEIN TRANSPOSITION Right 05/17/2020   Procedure: RIGHT UPPER EXTREMITY SECOND STAGE BASCILIC VEIN TRANSPOSITION;  Surgeon: Nada Libman, MD;  Location: [facility] OR;  Service: Vascular;  Laterality: Right;   BIOPSY  09/03/2017   Procedure: BIOPSY;  Surgeon: Kathi Der, MD;  Location: [facility] ENDOSCOPY;  Service: Gastroenterology;;   BIOPSY  12/24/2017   Procedure: BIOPSY;  Surgeon: Lemar Lofty., MD;  Location: [facility] ENDOSCOPY;  Service: Gastroenterology;;   CARDIAC CATHETERIZATION  2000's   COLONOSCOPY WITH PROPOFOL N/A 09/04/2017   Procedure: COLONOSCOPY WITH PROPOFOL;  Surgeon: Kerin Salen, MD;  Location: [facility] ENDOSCOPY;  Service: Gastroenterology;  Laterality: N/A;   ENTEROSCOPY N/A 12/24/2017   Procedure: ENTEROSCOPY;  Surgeon: Meridee Score Netty Starring., MD;  Location: [facility] ENDOSCOPY;  Service: Gastroenterology;  Laterality: N/A;   ESOPHAGOGASTRODUODENOSCOPY     ESOPHAGOGASTRODUODENOSCOPY (EGD) WITH PROPOFOL Left 06/04/2017   Procedure: ESOPHAGOGASTRODUODENOSCOPY (EGD) WITH PROPOFOL;  Surgeon: Willis Modena, MD;  Location: [facility] ENDOSCOPY;  Service: Endoscopy;  Laterality: Left;   ESOPHAGOGASTRODUODENOSCOPY (EGD) WITH PROPOFOL N/A 09/03/2017   Procedure: ESOPHAGOGASTRODUODENOSCOPY (EGD) WITH PROPOFOL;  Surgeon: Kathi Der, MD;  Location: [facility] ENDOSCOPY;  Service: Gastroenterology;  Laterality: N/A;   ESOPHAGOGASTRODUODENOSCOPY (EGD) WITH PROPOFOL N/A 04/27/2022   Procedure: ESOPHAGOGASTRODUODENOSCOPY (EGD) WITH PROPOFOL;  Surgeon: Regis Bill, MD;  Location: [facility] ENDOSCOPY;  Service: Endoscopy;  Laterality: N/A;   FISTULA SUPERFICIALIZATION Left 04/02/2017   Procedure: FISTULA SUPERFICIALIZATION LEFT ARM;  Surgeon: Nada Libman, MD;  Location: [facility] OR;  Service: Vascular;  Laterality: Left;   FISTULA SUPERFICIALIZATION Right 04/14/2021   Procedure: RIGHT ARM FISTULA SUPERFICIALIZATION;  Surgeon: Victorino Sparrow, MD;  Location: [facility] OR;  Service: Vascular;   Laterality: Right;  PERIPHERAL NERVE BLOCK   FISTULOGRAM Left 04/07/2016   Procedure: FISTULOGRAM;  Surgeon: Maeola Harman, MD;  Location: [facility] OR;  Service: Vascular;  Laterality: Left;   FRACTURE SURGERY     right ankle   GIVENS CAPSULE STUDY N/A 10/29/2017   Procedure: GIVENS CAPSULE STUDY;  Surgeon: Charlott Rakes, MD;  Location: [facility] ENDOSCOPY;  Service: Endoscopy;  Laterality: N/A;   GIVENS CAPSULE STUDY N/A 12/24/2017   Procedure: GIVENS CAPSULE STUDY;  Surgeon: Lemar Lofty., MD;  Location: [facility] ENDOSCOPY;  Service: Gastroenterology;  Laterality: N/A;   GIVENS CAPSULE STUDY N/A 05/22/2018   Procedure: GIVENS CAPSULE STUDY;  Surgeon: Lemar Lofty., MD;  Location: [facility] ENDOSCOPY;  Service: Gastroenterology;  Laterality: N/A;   INSERTION OF DIALYSIS CATHETER Right 04/07/2016   Procedure: INSERTION OF DIALYSIS CATHETER;  Surgeon: Maeola Harman, MD;  Location: [facility] OR;  Service: Vascular;  Laterality: Right;   INSERTION OF DIALYSIS CATHETER Right 04/04/2017   Procedure: INSERTION OF DIALYSIS CATHETER;  Surgeon: Maeola Harman, MD;  Location: [facility] OR;  Service: Vascular;  Laterality: Right;   IR FLUORO GUIDE CV LINE LEFT  03/13/2020   IR US GUIDE VASC ACCESS LEFT  03/13/2020   LEFT HEART CATH AND CORONARY ANGIOGRAPHY N/A 05/12/2019   Procedure: LEFT HEART CATH AND CORONARY ANGIOGRAPHY;  Surgeon: Orpah Cobb, MD;  [facility]: [facility] INVASIVE CV LAB;  Service: Cardiovascular;  Laterality: N/A;   LIGATION OF ARTERIOVENOUS  FISTULA Left 04/14/2016   Procedure: LIGATION OF ARTERIOVENOUS  FISTULA;  Surgeon: Chuck Hint, MD;  Location: [facility] OR;  Service: Vascular;  Laterality: Left;   LIGATION OF COMPETING BRANCHES OF ARTERIOVENOUS FISTULA Right 05/17/2020   Procedure: LIGATION OF COMPETING BRANCHES OF RIGHT ARM ARTERIOVENOUS FISTULA;  Surgeon: Nada Libman, MD;  Location: [facility] OR;  Service: Vascular;  Laterality: Right;   PATCH ANGIOPLASTY Left 06/19/2016    Procedure: PATCH ANGIOPLASTY LEFT ARTERIOVENOUS FISTULA;  Surgeon: Chuck Hint, MD;  Location: [facility] OR;  Service: Vascular;  Laterality: Left;   POLYPECTOMY  09/04/2017   Procedure: POLYPECTOMY;  Surgeon: Kerin Salen, MD;  Location: [facility] ENDOSCOPY;  Service: Gastroenterology;;   REVISON OF ARTERIOVENOUS FISTULA Left 06/19/2016   Procedure: REVISION SUPERFICIALIZATION OF BRACHIOCEPHALIC ARTERIOVENOUS FISTULA;  Surgeon: Chuck Hint, MD;  Location: [facility] OR;  Service: Vascular;  Laterality: Left;   UPPER EXTREMITY VENOGRAPHY Bilateral 02/19/2021   Procedure: UPPER EXTREMITY VENOGRAPHY;  Surgeon: Victorino Sparrow, MD;  Location: [facility] INVASIVE CV LAB;  Service: Cardiovascular;  Laterality: Bilateral;   Social History:  reports that she has never smoked. She has never used smokeless tobacco. She reports that she does not drink alcohol and does not use drugs.  Allergies  Allergen Reactions   Nsaids Other (See Comments)    Cannot take due to Kidney disease/Kidney function  Other Reaction(s): Unknown   Tolmetin Other (See Comments)    Cannot take due to Kidney Disease    Family History  Problem Relation Age of Onset   Hypertension Mother    Thyroid disease Mother    Coronary artery disease Father    Hypertension Father    Diabetes Father    Colon cancer Maternal Aunt    Esophageal cancer Maternal Uncle    Inflammatory bowel disease Neg Hx    Liver disease Neg Hx    Pancreatic cancer Neg Hx    Rectal cancer Neg Hx    Stomach cancer Neg Hx     Prior to Admission medications   Medication Sig Start Date End Date Taking? Authorizing Provider  acetaminophen (TYLENOL) 500 MG tablet Take 1,000 mg by mouth every 6 (six) hours as needed for moderate pain.   Yes [provider]  allopurinol (ZYLOPRIM) 100 MG tablet Take 100 mg by mouth at bedtime.    Yes [provider]  ALPRAZolam Prudy Feeler) 1 MG tablet Take 1 mg by mouth 2 (two) times daily as needed for anxiety or  sleep. 03/19/17  Yes [provider]  carbidopa-levodopa (SINEMET IR) 25-100 MG tablet TAKE 1 TABLET BY MOUTH THREE TIMES DAILY 06/02/22  Yes Levert Feinstein, MD  Diclofenac Sodium 1 % CREA 1 gram qid prn 05/26/21  Yes Levert Feinstein, MD  DULoxetine (CYMBALTA) 30 MG capsule Take 1 capsule (30 mg total) by mouth daily. 05/06/22  Yes Levert Feinstein, MD  ezetimibe (ZETIA) 10 MG tablet Take 10 mg by mouth daily. 02/21/21  Yes [provider]  gabapentin (NEURONTIN) 100 MG capsule TAKE 3 CAPSULES(300 MG) BY MOUTH THREE TIMES DAILY 06/25/22  Yes Levert Feinstein, MD  hydrocortisone (ANUSOL-HC) 2.5 % rectal cream Place 1 Application rectally 2 (two) times daily. 07/01/22  Yes Pilar Jarvis, MD  hydrOXYzine (ATARAX/VISTARIL) 25 MG tablet  Take 25 mg by mouth every 6 (six) hours as needed for itching. 08/16/19  Yes [provider]  isosorbide mononitrate (IMDUR) 60 MG 24 hr tablet Take 1 tablet (60 mg total) by mouth every evening. Skip the dose if systolic BP less than 140 mmHg 05/13/22 05/13/23 Yes Gillis Santa, MD  lanthanum (FOSRENOL) 1000 MG chewable tablet Chew 1,000-3,000 mg by mouth See admin instructions. Take 3000 mg with each meal and 1000-2000 mg with each snack 02/05/21  Yes [provider]  lidocaine-prilocaine (EMLA) cream 1gram tid prn 05/26/21  Yes Levert Feinstein, MD  metoprolol succinate (TOPROL-XL) 50 MG 24 hr tablet Take 50 mg by mouth every evening. Take with or immediately following a meal.   Yes [provider]  multivitamin (RENA-VIT) TABS tablet Take 1 tablet by mouth at bedtime.    Yes [provider]  ondansetron (ZOFRAN) 4 MG tablet Take 4 mg by mouth 2 (two) times daily as needed. 06/25/22  Yes [provider]  polyethylene glycol (MIRALAX) 17 g packet Take 17 g by mouth daily. 07/01/22  Yes Pilar Jarvis, MD  promethazine (PHENERGAN) 25 MG tablet Take 25 mg by mouth 3 (three) times daily as needed. 06/25/22  Yes [provider]  rOPINIRole (REQUIP) 1 MG  tablet Take 1 tablet (1 mg total) by mouth at bedtime. 04/29/22  Yes Levert Feinstein, MD  vortioxetine HBr (TRINTELLIX) 10 MG TABS tablet Take 10 mg by mouth daily.   Yes [provider]  zolpidem (AMBIEN) 5 MG tablet Take 5 mg by mouth at bedtime. 04/07/21  Yes [provider]  incobotulinumtoxinA (XEOMIN) 50 units SOLR injection Inject 50 Units into the muscle every 3 (three) months. Patient not taking: Reported on 07/20/2022 05/27/21   Levert Feinstein, MD  omeprazole (PRILOSEC) 40 MG capsule Take 40 mg by mouth in the morning and at bedtime.    [provider]    Physical Exam: Vitals:   07/20/22 1047 07/20/22 1049 07/20/22 1113 07/20/22 1336  BP:  (!) 189/92  (!) 180/90  Pulse:  72  70  Resp:  20  20  Temp:  98.4 F (36.9 C)    TempSrc:  Oral    SpO2:  98%  98%  Weight: 105.7 kg  105.7 kg   Height:   5\' 7"  (1.702 m)    Physical Exam Vitals and nursing note reviewed.  Constitutional:      General: She is not in acute distress.    Appearance: She is obese.  HENT:     Head: Normocephalic and atraumatic.     Mouth/Throat:     Mouth: Mucous membranes are moist.     Pharynx: Oropharynx is clear.  Eyes:     Conjunctiva/sclera: Conjunctivae normal.     Pupils: Pupils are equal, round, and reactive to light.  Cardiovascular:     Rate and Rhythm: Normal rate and regular rhythm.     Heart sounds: No murmur heard. Pulmonary:     Effort: Tachypnea and respiratory distress (Mild, requiring breaths every few words) present. No accessory muscle usage.     Breath sounds: Examination of the left-lower field reveals rales. Rales present. No decreased breath sounds, wheezing or rhonchi.  Abdominal:     General: Bowel sounds are normal. There is no distension.     Palpations: Abdomen is soft.     Tenderness: There is no abdominal tenderness.  Musculoskeletal:     Right lower leg: No edema.     Left  lower leg: No edema.  Skin:    General: Skin is warm and dry.   Neurological:     General: No focal deficit present.     Mental Status: She is alert and oriented to person, place, and time.  Psychiatric:        Mood and Affect: Mood normal.        Behavior: Behavior normal.    Data Reviewed: CBC with WBC of 6.8, hemoglobin 8.1, platelets of 258 CMP with sodium of 141, potassium 6.1, bicarb 26, glucose 101, BUN 70, creatinine 9.9, AST 43, ALT 90 GFR 4 COVID-19 PCR negative  EKG personally reviewed.  Sinus rhythm with rate of 73.  No ischemic appearing ST or T wave changes.  DG Chest 2 View  Result Date: 07/20/2022 CLINICAL DATA:  Provided history: Painful cough. Additional history provided: Cough. Wheezing. Chills. EXAM: CHEST - 2 VIEW COMPARISON:  Prior chest radiographs 07/15/2022 and earlier. FINDINGS: Heart size within normal limits. Patchy and hazy pulmonary opacities bilaterally, new from the prior examination of 07/15/2022. Chronic elevation of the right hemidiaphragm. No evidence of pleural effusion or pneumothorax. No acute osseous abnormality identified. Dextrocurvature of the imaged thoracolumbar spine. A vascular stent projects in the right subclavian/axillary region. Residual contrast within the colon. IMPRESSION: 1. Patchy and hazy pulmonary opacities bilaterally, new from the prior examination of 07/15/2022. Findings may reflect infection and/or edema. 2. Chronic elevation of the right hemidiaphragm. 3. Dextrocurvature of the imaged thoracolumbar spine. Electronically Signed   By: Jackey Loge D.O.   On: 07/20/2022 11:24    Results are pending, will review when available.  Assessment and Plan:  * Acute respiratory distress Patient is presenting with several day history of fever/chills, 1 day of malaise/fatigue and development of nonproductive cough and shortness of breath over the last 12 to 15 hours.  Chest x-ray with bilateral opacities concerning for pneumonia versus edema.  Given ESRD on HD, hyperkalemia, and elevated BUN, I wonder if  this could be secondary to inadequate UF with last dialysis, however clinical history is worrisome for infectious etiology as well.  No history of asthma or COPD.  - Continue supplemental oxygen to maintain oxygen saturation above 88% - Wean as tolerated - RVP and procalcitonin pending - Start empiric ceftriaxone and azithromycin; discontinue if procalcitonin is negative - Dialysis this afternoon per nephrology  ESRD on dialysis [facility]) - Nephrology consulted; appreciate their recommendations - Dialysis planned for today   Type 2 diabetes mellitus with other diabetic kidney complication (HCC) - SSI, very sensitive  Idiopathic peripheral neuropathy - Continue home regimen  Anemia due to chronic kidney disease Hemoglobin is stable at this time with no evidence of bleeding on examination  - Management per nephrology  Hypertension Hypertensive at this time.  - Resume home antihypertensives  Advance Care Planning:   Code Status: Full Code   Consults: Nephrology  Family Communication: No family at bedside  Severity of Illness: The appropriate patient status for this patient is OBSERVATION. Observation status is judged to be reasonable and necessary in order to provide the required intensity of service to ensure the patient's safety. The patient's presenting symptoms, physical exam findings, and initial radiographic and laboratory data in the context of their medical condition is felt to place them at decreased risk for further clinical deterioration. Furthermore, it is anticipated that the patient will be medically stable for discharge from the hospital within 2 midnights of admission.   Author: Verdene Lennert, MD 07/20/2022 2:11 PM  For  on call review [email or web address].

## 2022-07-20 NOTE — ED Triage Notes (Signed)
Cough and wheezing all night.  Also c/o chills.  Symptoms started yesterday with sore throat.  Last night felt worse.  AAOx3.  Skin warm and dry. NAD

## 2022-07-20 NOTE — Assessment & Plan Note (Signed)
Hypertensive at this time.  - Resume home antihypertensives

## 2022-07-21 DIAGNOSIS — G609 Hereditary and idiopathic neuropathy, unspecified: Secondary | ICD-10-CM | POA: Diagnosis present

## 2022-07-21 DIAGNOSIS — R0603 Acute respiratory distress: Secondary | ICD-10-CM | POA: Diagnosis present

## 2022-07-21 DIAGNOSIS — K219 Gastro-esophageal reflux disease without esophagitis: Secondary | ICD-10-CM | POA: Diagnosis present

## 2022-07-21 DIAGNOSIS — D631 Anemia in chronic kidney disease: Secondary | ICD-10-CM | POA: Diagnosis present

## 2022-07-21 DIAGNOSIS — M109 Gout, unspecified: Secondary | ICD-10-CM | POA: Diagnosis present

## 2022-07-21 DIAGNOSIS — E875 Hyperkalemia: Secondary | ICD-10-CM | POA: Diagnosis present

## 2022-07-21 DIAGNOSIS — N2581 Secondary hyperparathyroidism of renal origin: Secondary | ICD-10-CM | POA: Diagnosis present

## 2022-07-21 DIAGNOSIS — N186 End stage renal disease: Secondary | ICD-10-CM | POA: Diagnosis present

## 2022-07-21 DIAGNOSIS — E1140 Type 2 diabetes mellitus with diabetic neuropathy, unspecified: Secondary | ICD-10-CM | POA: Diagnosis present

## 2022-07-21 DIAGNOSIS — Z992 Dependence on renal dialysis: Secondary | ICD-10-CM | POA: Diagnosis not present

## 2022-07-21 DIAGNOSIS — E7800 Pure hypercholesterolemia, unspecified: Secondary | ICD-10-CM | POA: Diagnosis present

## 2022-07-21 DIAGNOSIS — I5032 Chronic diastolic (congestive) heart failure: Secondary | ICD-10-CM | POA: Diagnosis present

## 2022-07-21 DIAGNOSIS — I160 Hypertensive urgency: Secondary | ICD-10-CM | POA: Diagnosis present

## 2022-07-21 DIAGNOSIS — F419 Anxiety disorder, unspecified: Secondary | ICD-10-CM | POA: Diagnosis present

## 2022-07-21 DIAGNOSIS — I132 Hypertensive heart and chronic kidney disease with heart failure and with stage 5 chronic kidney disease, or end stage renal disease: Secondary | ICD-10-CM | POA: Diagnosis present

## 2022-07-21 DIAGNOSIS — Z1152 Encounter for screening for COVID-19: Secondary | ICD-10-CM | POA: Diagnosis not present

## 2022-07-21 DIAGNOSIS — Z7682 Awaiting organ transplant status: Secondary | ICD-10-CM | POA: Diagnosis not present

## 2022-07-21 DIAGNOSIS — E669 Obesity, unspecified: Secondary | ICD-10-CM | POA: Diagnosis present

## 2022-07-21 DIAGNOSIS — E1122 Type 2 diabetes mellitus with diabetic chronic kidney disease: Secondary | ICD-10-CM | POA: Diagnosis present

## 2022-07-21 DIAGNOSIS — I959 Hypotension, unspecified: Secondary | ICD-10-CM | POA: Diagnosis not present

## 2022-07-21 DIAGNOSIS — J9691 Respiratory failure, unspecified with hypoxia: Secondary | ICD-10-CM | POA: Diagnosis present

## 2022-07-21 DIAGNOSIS — J189 Pneumonia, unspecified organism: Secondary | ICD-10-CM | POA: Diagnosis present

## 2022-07-21 DIAGNOSIS — R0609 Other forms of dyspnea: Secondary | ICD-10-CM | POA: Diagnosis not present

## 2022-07-21 DIAGNOSIS — D509 Iron deficiency anemia, unspecified: Secondary | ICD-10-CM | POA: Diagnosis present

## 2022-07-21 DIAGNOSIS — J810 Acute pulmonary edema: Secondary | ICD-10-CM | POA: Diagnosis present

## 2022-07-21 LAB — CBC
HCT: 26.8 % — ABNORMAL LOW (ref 36.0–46.0)
Hemoglobin: 8.5 g/dL — ABNORMAL LOW (ref 12.0–15.0)
MCH: 26.3 pg (ref 26.0–34.0)
MCHC: 31.7 g/dL (ref 30.0–36.0)
MCV: 83 fL (ref 80.0–100.0)
Platelets: 281 10*3/uL (ref 150–400)
RBC: 3.23 MIL/uL — ABNORMAL LOW (ref 3.87–5.11)
RDW: 16.4 % — ABNORMAL HIGH (ref 11.5–15.5)
WBC: 8 10*3/uL (ref 4.0–10.5)
nRBC: 0.6 % — ABNORMAL HIGH (ref 0.0–0.2)

## 2022-07-21 LAB — RESPIRATORY PANEL BY PCR

## 2022-07-21 LAB — BASIC METABOLIC PANEL
Anion gap: 14 (ref 5–15)
BUN: 37 mg/dL — ABNORMAL HIGH (ref 6–20)
CO2: 28 mmol/L (ref 22–32)
Calcium: 8.9 mg/dL (ref 8.9–10.3)
Chloride: 92 mmol/L — ABNORMAL LOW (ref 98–111)
Creatinine, Ser: 6.18 mg/dL — ABNORMAL HIGH (ref 0.44–1.00)
GFR, Estimated: 8 mL/min — ABNORMAL LOW (ref >60)
Glucose, Bld: 105 mg/dL — ABNORMAL HIGH (ref 70–99)
Potassium: 4.5 mmol/L (ref 3.5–5.1)
Sodium: 134 mmol/L — ABNORMAL LOW (ref 135–145)

## 2022-07-21 LAB — TROPONIN I (HIGH SENSITIVITY)
Troponin I (High Sensitivity): 25 ng/L — ABNORMAL HIGH (ref <18)
Troponin I (High Sensitivity): 30 ng/L — ABNORMAL HIGH (ref <18)

## 2022-07-21 LAB — HEPATITIS B SURFACE ANTIBODY, QUANTITATIVE: Hep B S AB Quant (Post): 52.9 m[IU]/mL

## 2022-07-21 LAB — MRSA NEXT GEN BY PCR, NASAL: MRSA by PCR Next Gen: NOT DETECTED

## 2022-07-21 MED ORDER — HYDROCOD POLI-CHLORPHE POLI ER 10-8 MG/5ML PO SUER
5.0000 mL | Freq: Two times a day (BID) | ORAL | Status: DC
  Administered 2022-07-21 – 2022-07-24 (×6): 5 mL via ORAL
  Filled 2022-07-21 (×6): qty 5

## 2022-07-21 MED ORDER — ALPRAZOLAM 0.5 MG PO TABS
0.5000 mg | ORAL_TABLET | Freq: Once | ORAL | Status: AC
  Administered 2022-07-21: 0.5 mg via ORAL
  Filled 2022-07-21: qty 1

## 2022-07-21 MED ORDER — BENZONATATE 100 MG PO CAPS
200.0000 mg | ORAL_CAPSULE | Freq: Three times a day (TID) | ORAL | Status: DC
  Administered 2022-07-21 – 2022-07-24 (×10): 200 mg via ORAL
  Filled 2022-07-21 (×10): qty 2

## 2022-07-21 MED ORDER — SODIUM CHLORIDE 0.9 % IV SOLN
12.5000 mg | Freq: Four times a day (QID) | INTRAVENOUS | Status: DC | PRN
  Administered 2022-07-21 – 2022-07-22 (×3): 12.5 mg via INTRAVENOUS
  Filled 2022-07-21 (×2): qty 0.5
  Filled 2022-07-21: qty 12.5

## 2022-07-21 MED ORDER — AMLODIPINE BESYLATE 10 MG PO TABS
10.0000 mg | ORAL_TABLET | Freq: Every day | ORAL | Status: DC
  Administered 2022-07-21 – 2022-07-24 (×4): 10 mg via ORAL
  Filled 2022-07-21 (×4): qty 1

## 2022-07-21 MED ORDER — HYDRALAZINE HCL 50 MG PO TABS
50.0000 mg | ORAL_TABLET | Freq: Three times a day (TID) | ORAL | Status: DC
  Administered 2022-07-21 – 2022-07-24 (×11): 50 mg via ORAL
  Filled 2022-07-21 (×11): qty 1

## 2022-07-21 MED ORDER — IPRATROPIUM-ALBUTEROL 0.5-2.5 (3) MG/3ML IN SOLN
3.0000 mL | RESPIRATORY_TRACT | Status: DC | PRN
  Administered 2022-07-21 – 2022-07-23 (×5): 3 mL via RESPIRATORY_TRACT
  Filled 2022-07-21 (×5): qty 3

## 2022-07-21 NOTE — Progress Notes (Signed)
PROGRESS NOTE    Patricia [name]  QIH:[id number] DOB: 09/12/1975 DOA: 07/20/2022 PCP: Rick Duff, PA-C    Brief Narrative:  47 y.o. female with medical history significant of ESRD on HD 2/2 [facility] on organ transplant list, hypertension, iron deficiency anemia, anemia of chronic kidney disease, HFpEF, who presents to the ED due to cough and shortness of breath.   Patricia [name] states that for the last couple days, she has been feeling feverish with chills.  Then yesterday, she noted that she felt generalized malaise with fatigue.  Then yesterday evening into the night, she developed shortness of breath with a nonproductive cough that has been progressively worsening.  She endorses chest pain only while coughing.  She endorses nausea but denies any vomiting, abdominal pain.  She notes that her feet were swollen yesterday, however swelling did not go any higher than her feet.  She states that her shortness of breath at this time feels different than her previous episodes of pulmonary edema.  She has not missed any dialysis sessions.  7/16: Patient underwent hemodialysis on 7/15 with 2 L fluid removal.  Remains with cough.  Blood pressure markedly elevated.   Assessment & Plan:   Principal Problem:   Acute respiratory distress Active Problems:   ESRD on dialysis (HCC)   Hypertension   Anemia due to chronic kidney disease   Idiopathic peripheral neuropathy   Type 2 diabetes mellitus with other diabetic kidney complication (HCC)  * Acute respiratory distress Community-acquired pneumonia Patient is presenting with several day history of fever/chills, 1 day of malaise/fatigue and development of nonproductive cough and shortness of breath over the last 12 to 15 hours.  Chest x-ray with bilateral opacities concerning for pneumonia versus edema.   RVP negative Procalcitonin mildly elevated Plan: Continue Rocephin Continue azithromycin DC droplet precautions Supplemental oxygen  as necessary Monitor vitals and fever curve  Hypertensive urgency Blood pressure remains elevated despite receiving home metoprolol and Imdur on 7/15.  She also received IV and p.o. hydralazine on 7/16. Plan: Continue metoprolol Continue Imdur Add hydralazine 50 mg every 8 hours Add amlodipine 10 mg daily IV hydralazine as needed Nephrology consider repeat hemodialysis If blood pressure remains elevated may need transfer to progressive unit and initiation of nitro gtt.   ESRD on dialysis [facility]) Nephrology on consult Status post HD 7/15.  2 L ultrafiltration Nephrology to follow-up regarding repeat HD    Idiopathic peripheral neuropathy Continue home regimen of gabapentin and Requip  Abnormal facial movement Patient on Sinemet.  Compliance unclear.  Indication unclear.  Will need outpatient referral to neurology at time of discharge   Anemia due to chronic kidney disease Hemoglobin is stable at this time with no evidence of bleeding on examination  Morbid obesity BMI 39.09.  Meets criteria for morbid obesity given chronic comorbidities.  This complicates overall care and prognosis.   DVT prophylaxis: SQ heparin Code Status: FULL Family Communication:None Disposition Plan: Status is: Observation The patient will require care spanning > 2 midnights and should be moved to inpatient because: Community-acquired pneumonia on IV antibiotics.  Hypertensive urgency in setting of ESRD   Level of care: Telemetry Medical  Consultants:  Nephrology  Procedures:  None  Antimicrobials: Rocephin Azithromycin   Subjective: Seen and examined.  Coughing, short of breath.  Objective: Vitals:   07/20/22 1953 07/21/22 0300 07/21/22 0741 07/21/22 0941  BP:  (!) 184/88 (!) 208/110 (!) 213/101  Pulse:  82 82 87  Resp: 20 18 18  Temp:  97.9 F (36.6 C) 98 F (36.7 C) 98.3 F (36.8 C)  TempSrc: Oral Oral  Oral  SpO2: 100% 100% 98% 99%  Weight: 113.2 kg     Height: 5\' 7"   (1.702 m)       Intake/Output Summary (Last 24 hours) at 07/21/2022 1050 Last data filed at 07/20/2022 1849 Gross per 24 hour  Intake --  Output 2000 ml  Net -2000 ml   Filed Weights   07/20/22 1113 07/20/22 1459 07/20/22 1953  Weight: 105.7 kg 120.2 kg 113.2 kg    Examination:  General exam: Appears uncomfortable Respiratory system: Coarse rhonchi bilaterally.  Normal work of breathing.  2 L Cardiovascular system: S1-S2, RRR, no murmurs, no pedal edema Gastrointestinal system: Obese, soft, NT/ND, normal bowel sounds Central nervous system: Alert and oriented. No focal neurological deficits. Extremities: Symmetric 5 x 5 power. Skin: No rashes, lesions or ulcers Psychiatry: Judgement and insight appear normal. Mood & affect appropriate.     Data Reviewed: I have personally reviewed following labs and imaging studies  CBC: Recent Labs  Lab 07/15/22 2110 07/20/22 1227 07/21/22 0413  WBC 4.3 6.8 8.0  NEUTROABS 2.4  --   --   HGB 7.3* 8.1* 8.5*  HCT 24.0* 26.3* 26.8*  MCV 85.4 86.5 83.0  PLT 214 258 281   Basic Metabolic Panel: Recent Labs  Lab 07/15/22 2110 07/20/22 1227 07/21/22 0413  NA 139 141 134*  K 3.6 6.1* 4.5  CL 95* 102 92*  CO2 31 26 28   GLUCOSE 109* 101* 105*  BUN 25* 70* 37*  CREATININE 5.15* 9.90* 6.18*  CALCIUM 8.4* 9.1 8.9   GFR: Estimated Creatinine Clearance: 14.4 mL/min (A) (by C-G formula based on SCr of 6.18 mg/dL (H)). Liver Function Tests: Recent Labs  Lab 07/15/22 2110 07/20/22 1227  AST 41 43*  ALT 16 9  ALKPHOS 82 80  BILITOT 0.4 0.4  PROT 7.2 7.3  ALBUMIN 3.6 3.5   No results for input(s): "LIPASE", "AMYLASE" in the last 168 hours. No results for input(s): "AMMONIA" in the last 168 hours. Coagulation Profile: No results for input(s): "INR", "PROTIME" in the last 168 hours. Cardiac Enzymes: No results for input(s): "CKTOTAL", "CKMB", "CKMBINDEX", "TROPONINI" in the last 168 hours. BNP (last 3 results) No results for  input(s): "PROBNP" in the last 8760 hours. HbA1C: No results for input(s): "HGBA1C" in the last 72 hours. CBG: No results for input(s): "GLUCAP" in the last 168 hours. Lipid Profile: No results for input(s): "CHOL", "HDL", "LDLCALC", "TRIG", "CHOLHDL", "LDLDIRECT" in the last 72 hours. Thyroid Function Tests: No results for input(s): "TSH", "T4TOTAL", "FREET4", "T3FREE", "THYROIDAB" in the last 72 hours. Anemia Panel: No results for input(s): "VITAMINB12", "FOLATE", "FERRITIN", "TIBC", "IRON", "RETICCTPCT" in the last 72 hours. Sepsis Labs: Recent Labs  Lab 07/20/22 1227  PROCALCITON 0.30    Recent Results (from the past 240 hour(s))  SARS Coronavirus 2 by RT PCR (hospital order, performed in [facility] hospital lab) *cepheid single result test* Anterior Nasal Swab     Status: None   Collection Time: 07/20/22 10:53 AM   Specimen: Anterior Nasal Swab  Result Value Ref Range Status   SARS Coronavirus 2 by RT PCR NEGATIVE NEGATIVE Final    Comment: (NOTE) SARS-CoV-2 target nucleic acids are NOT DETECTED.  The SARS-CoV-2 RNA is generally detectable in upper and lower respiratory specimens during the acute phase of infection. The lowest concentration of SARS-CoV-2 viral copies this assay can detect is 250 copies /  mL. A negative result does not preclude SARS-CoV-2 infection and should not be used as the sole basis for treatment or other patient management decisions.  A negative result may occur with improper specimen collection / handling, submission of specimen other than nasopharyngeal swab, presence of viral mutation(s) within the areas targeted by this assay, and inadequate number of viral copies (<250 copies / mL). A negative result must be combined with clinical observations, patient history, and epidemiological information.  Fact Sheet for Patients:   RoadLapTop.co.za  Fact Sheet for Healthcare  Providers: http://kim-miller.com/  This test is not yet approved or  cleared by the [place] FDA and has been authorized for detection and/or diagnosis of SARS-CoV-2 by FDA under an Emergency Use Authorization (EUA).  This EUA will remain in effect (meaning this test can be used) for the duration of the COVID-19 declaration under Section 564(b)(1) of the Act, 21 U.S.C. section 360bbb-3(b)(1), unless the authorization is terminated or revoked sooner.  Performed at [facility], [address] [place], [place] [address]   Respiratory (~20 pathogens) panel by PCR     Status: None   Collection Time: 07/20/22 11:48 PM   Specimen: Nasopharyngeal Swab; Respiratory  Result Value Ref Range Status   Adenovirus NOT DETECTED NOT DETECTED Final   Coronavirus 229E NOT DETECTED NOT DETECTED Final    Comment: (NOTE) The Coronavirus on the Respiratory Panel, DOES NOT test for the novel  Coronavirus (2019 nCoV)    Coronavirus HKU1 NOT DETECTED NOT DETECTED Final   Coronavirus NL63 NOT DETECTED NOT DETECTED Final   Coronavirus OC43 NOT DETECTED NOT DETECTED Final   Metapneumovirus NOT DETECTED NOT DETECTED Final   Rhinovirus / Enterovirus NOT DETECTED NOT DETECTED Final   Influenza A NOT DETECTED NOT DETECTED Final   Influenza B NOT DETECTED NOT DETECTED Final   Parainfluenza Virus 1 NOT DETECTED NOT DETECTED Final   Parainfluenza Virus 2 NOT DETECTED NOT DETECTED Final   Parainfluenza Virus 3 NOT DETECTED NOT DETECTED Final   Parainfluenza Virus 4 NOT DETECTED NOT DETECTED Final   Respiratory Syncytial Virus NOT DETECTED NOT DETECTED Final   Bordetella pertussis NOT DETECTED NOT DETECTED Final   Bordetella Parapertussis NOT DETECTED NOT DETECTED Final   Chlamydophila pneumoniae NOT DETECTED NOT DETECTED Final   Mycoplasma pneumoniae NOT DETECTED NOT DETECTED Final    Comment: Performed at [facility], [address] [place], [place] [address]  MRSA  Next Gen by PCR, Nasal     Status: None   Collection Time: 07/20/22 11:48 PM   Specimen: Nasal Mucosa; Nasal Swab  Result Value Ref Range Status   MRSA by PCR Next Gen NOT DETECTED NOT DETECTED Final    Comment: (NOTE) The GeneXpert MRSA Assay (FDA approved for NASAL specimens only), is one component of a comprehensive MRSA colonization surveillance program. It is not intended to diagnose MRSA infection nor to guide or monitor treatment for MRSA infections. Test performance is not FDA approved in patients less than 51 years old. Performed at [facility], [address], [place], [place] [address]          Radiology Studies: DG Chest 2 View  Result Date: 07/20/2022 CLINICAL DATA:  Provided history: Painful cough. Additional history provided: Cough. Wheezing. Chills. EXAM: CHEST - 2 VIEW COMPARISON:  Prior chest radiographs 07/15/2022 and earlier. FINDINGS: Heart size within normal limits. Patchy and hazy pulmonary opacities bilaterally, new from the prior examination of 07/15/2022. Chronic elevation of the right hemidiaphragm.  No evidence of pleural effusion or pneumothorax. No acute osseous abnormality identified. Dextrocurvature of the imaged thoracolumbar spine. A vascular stent projects in the right subclavian/axillary region. Residual contrast within the colon. IMPRESSION: 1. Patchy and hazy pulmonary opacities bilaterally, new from the prior examination of 07/15/2022. Findings may reflect infection and/or edema. 2. Chronic elevation of the right hemidiaphragm. 3. Dextrocurvature of the imaged thoracolumbar spine. Electronically Signed   By: Jackey Loge D.O.   On: 07/20/2022 11:24        Scheduled Meds:  allopurinol  100 mg Oral QHS   amLODipine  10 mg Oral Daily   benzonatate  200 mg Oral TID   carbidopa-levodopa  1 tablet Oral TID   Chlorhexidine Gluconate Cloth  6 each Topical Q0600   chlorpheniramine-HYDROcodone  5 mL Oral Q12H   DULoxetine  30 mg Oral Daily    ezetimibe  10 mg Oral Daily   gabapentin  300 mg Oral TID   guaiFENesin  600 mg Oral BID   heparin  5,000 Units Subcutaneous Q8H   hydrALAZINE  50 mg Oral Q8H   isosorbide mononitrate  60 mg Oral QPM   metoprolol succinate  50 mg Oral QPM   rOPINIRole  1 mg Oral QHS   sodium chloride flush  3 mL Intravenous Q12H   vortioxetine HBr  10 mg Oral Daily   Continuous Infusions:  anticoagulant sodium citrate     azithromycin 500 mg (07/20/22 2021)   cefTRIAXone (ROCEPHIN)  IV 2 g (07/20/22 2107)     LOS: 0 days   Tresa Moore, MD [facility]   If 7PM-7AM, please contact night-coverage  07/21/2022, 10:50 AM

## 2022-07-21 NOTE — Progress Notes (Signed)
EKG placed in chart and MD has been notified EKG shows normal sinus rhythm and troponin is 25. MD has reviewed chart. Patient to be monitor for now. No new orders.

## 2022-07-21 NOTE — Discharge Planning (Signed)
ESTBLISHED HEMODIALYSIS Outpatient Facility  [facility]  [address]  [place] [address] [phone]  Schedule: Monday Wednesday and Friday @ 05:25am  Dimas Chyle Dialysis Coordinator II  [facility] Cell: [phone] eFax: [phone] [email or web address]

## 2022-07-21 NOTE — Progress Notes (Signed)
Vital signs obtained as the patient complained of chest tightness. The patient has vomited of fluid. Breathing treatment has been order and administed. MD has been notfied. Verbal orders given for stat troponin and STAT 12 Lead EKG. Zofran given at this time to help control nausea. MD has ordered IV phenergan as well. The patient is to be monitored for now. MD to be notified of any further changes. The patient continues to be on one liter of oxygen.   07/21/22 1057  Vitals  Temp 98.4 F (36.9 C)  BP (!) 192/97  MAP (mmHg) 123  BP Location Left Arm  BP Method Automatic  Patient Position (if appropriate) Lying  Pulse Rate 86  Pulse Rate Source Monitor  Resp 20  MEWS COLOR  MEWS Score Color Green  Oxygen Therapy  SpO2 98 %  MEWS Score  MEWS Temp 0  MEWS Systolic 0  MEWS Pulse 0  MEWS RR 0  MEWS LOC 0  MEWS Score 0

## 2022-07-21 NOTE — Progress Notes (Signed)
Yellow MEWS score protocol initiated. MD, Nephrology, charge nurse, nurse and Nurse tech are aware. Vitals to be obtained per order. New orders have been order by MD.   07/21/22 0741  Assess: MEWS Score  Temp 98 F (36.7 C)  BP (!) 208/110  MAP (mmHg) 137  Pulse Rate 82  Resp 18  SpO2 98 %  O2 Device Room Air  Assess: MEWS Score  MEWS Temp 0  MEWS Systolic 2  MEWS Pulse 0  MEWS RR 0  MEWS LOC 0  MEWS Score 2  MEWS Score Color Yellow  Assess: if the MEWS score is Yellow or Red  Were vital signs taken at a resting state? Yes  Focused Assessment Change from prior assessment (see assessment flowsheet)  Does the patient meet 2 or more of the SIRS criteria? No  MEWS guidelines implemented  Yes, yellow  Treat  MEWS Interventions Considered administering scheduled or prn medications/treatments as ordered  Take Vital Signs  Increase Vital Sign Frequency  Yellow: Q2hr x1, continue Q4hrs until patient remains green for 12hrs  Escalate  MEWS: Escalate Yellow: Discuss with charge nurse and consider notifying provider and/or RRT  Notify: Charge Nurse/RN  Name of Charge Nurse/RN Notified Yisroel Ramming, RN  Provider Notification  Provider Name/Title Lolita Patella, MD  Date Provider Notified 07/21/22  Time Provider Notified [phone]  Method of Notification Page  Notification Reason Change in status  Provider response See new orders  Date of Provider Response 07/21/22  Time of Provider Response [phone]  Notify: Rapid Response  Name of Rapid Response RN Notified n/a  Assess: SIRS CRITERIA  SIRS Temperature  0  SIRS Pulse 0  SIRS Respirations  0  SIRS WBC 0  SIRS Score Sum  0

## 2022-07-21 NOTE — TOC CM/SW Note (Signed)
Transition of Care [facility]) - Inpatient Brief Assessment   Patient Details  Name: Patricia [name] MRN: [id number] Date of Birth: 02-Feb-1975  Transition of Care [facility]) CM/SW Contact:    Chapman Fitch, RN Phone Number: 07/21/2022, 10:02 AM   Clinical Narrative:  Patient currently on 1 L acute O2.  Please consult TOC if home O2 indicated   Transition of Care Asessment: Insurance and Status: Insurance coverage has been reviewed       Prior/Current Home Services: No current home services Social Determinants of Health Reivew: SDOH reviewed no interventions necessary Readmission risk has been reviewed:  (Patient observation so readmission score doesnt generate) Transition of care needs: transition of care needs identified, TOC will continue to follow

## 2022-07-21 NOTE — Progress Notes (Addendum)
[facility]  ROUNDING NOTE   Subjective:   [name] was admitted to [facility] on 07/20/2022 for Acute respiratory distress [R06.03] Acute pulmonary edema (HCC) [J81.0]  Dialysis received yesterday, UF 2L   Resting comfortably today  1L [place]  Objective:  Vital signs in last 24 hours:  Temp:  [97.9 F (36.6 C)-98.6 F (37 C)] 98.3 F (36.8 C) (07/16 0941) Pulse Rate:  [70-87] 87 (07/16 0941) Resp:  [16-30] 18 (07/16 0741) BP: (164-213)/(82-112) 213/101 (07/16 0941) SpO2:  [98 %-100 %] 99 % (07/16 0941) Weight:  [105.7 kg-120.2 kg] 113.2 kg (07/15 1953)  Weight change:  Filed Weights   07/20/22 1113 07/20/22 1459 07/20/22 1953  Weight: 105.7 kg 120.2 kg 113.2 kg    Intake/Output: I/O last 3 completed shifts: In: -  Out: 2000 [Other:2000]   Intake/Output this shift:  No intake/output data recorded.  Physical Exam: General: NAD, laying in stretcher  Head: Normocephalic, atraumatic. Moist oral mucosal membranes  Eyes: Anicteric  Lungs:   Normal effort  Heart: Regular rate and rhythm  Abdomen:  Soft, nontender  Extremities:  no peripheral edema.  Neurologic: Alert and oriented, moving all four extremities  Skin: No lesions  Access: Left AVF    Basic Metabolic Panel: Recent Labs  Lab 07/15/22 2110 07/20/22 1227 07/21/22 0413  NA 139 141 134*  K 3.6 6.1* 4.5  CL 95* 102 92*  CO2 31 26 28   GLUCOSE 109* 101* 105*  BUN 25* 70* 37*  CREATININE 5.15* 9.90* 6.18*  CALCIUM 8.4* 9.1 8.9    Liver Function Tests: Recent Labs  Lab 07/15/22 2110 07/20/22 1227  AST 41 43*  ALT 16 9  ALKPHOS 82 80  BILITOT 0.4 0.4  PROT 7.2 7.3  ALBUMIN 3.6 3.5   No results for input(s): "LIPASE", "AMYLASE" in the last 168 hours. No results for input(s): "AMMONIA" in the last 168 hours.  CBC: Recent Labs  Lab 07/15/22 2110 07/20/22 1227 07/21/22 0413  WBC 4.3 6.8 8.0  NEUTROABS 2.4  --   --   HGB 7.3* 8.1* 8.5*  HCT 24.0* 26.3* 26.8*   MCV 85.4 86.5 83.0  PLT 214 258 281    Cardiac Enzymes: No results for input(s): "CKTOTAL", "CKMB", "CKMBINDEX", "TROPONINI" in the last 168 hours.  BNP: Invalid input(s): "POCBNP"  CBG: No results for input(s): "GLUCAP" in the last 168 hours.  Microbiology: Results for orders placed or performed during the hospital encounter of 07/20/22  SARS Coronavirus 2 by RT PCR (hospital order, performed in [facility] hospital lab) *cepheid single result test* Anterior Nasal Swab     Status: None   Collection Time: 07/20/22 10:53 AM   Specimen: Anterior Nasal Swab  Result Value Ref Range Status   SARS Coronavirus 2 by RT PCR NEGATIVE NEGATIVE Final    Comment: (NOTE) SARS-CoV-2 target nucleic acids are NOT DETECTED.  The SARS-CoV-2 RNA is generally detectable in upper and lower respiratory specimens during the acute phase of infection. The lowest concentration of SARS-CoV-2 viral copies this assay can detect is 250 copies / mL. A negative result does not preclude SARS-CoV-2 infection and should not be used as the sole basis for treatment or other patient management decisions.  A negative result may occur with improper specimen collection / handling, submission of specimen other than nasopharyngeal swab, presence of viral mutation(s) within the areas targeted by this assay, and inadequate number of viral copies (<250 copies / mL). A negative result must be combined  with clinical observations, patient history, and epidemiological information.  Fact Sheet for Patients:   RoadLapTop.co.za  Fact Sheet for Healthcare Providers: http://kim-miller.com/  This test is not yet approved or  cleared by the [place] FDA and has been authorized for detection and/or diagnosis of SARS-CoV-2 by FDA under an Emergency Use Authorization (EUA).  This EUA will remain in effect (meaning this test can be used) for the duration of the COVID-19 declaration  under Section 564(b)(1) of the Act, 21 U.S.C. section 360bbb-3(b)(1), unless the authorization is terminated or revoked sooner.  Performed at [facility], [address] [place], [place] [address]   Respiratory (~20 pathogens) panel by PCR     Status: None   Collection Time: 07/20/22 11:48 PM   Specimen: Nasopharyngeal Swab; Respiratory  Result Value Ref Range Status   Adenovirus NOT DETECTED NOT DETECTED Final   Coronavirus 229E NOT DETECTED NOT DETECTED Final    Comment: (NOTE) The Coronavirus on the Respiratory Panel, DOES NOT test for the novel  Coronavirus (2019 nCoV)    Coronavirus HKU1 NOT DETECTED NOT DETECTED Final   Coronavirus NL63 NOT DETECTED NOT DETECTED Final   Coronavirus OC43 NOT DETECTED NOT DETECTED Final   Metapneumovirus NOT DETECTED NOT DETECTED Final   Rhinovirus / Enterovirus NOT DETECTED NOT DETECTED Final   Influenza A NOT DETECTED NOT DETECTED Final   Influenza B NOT DETECTED NOT DETECTED Final   Parainfluenza Virus 1 NOT DETECTED NOT DETECTED Final   Parainfluenza Virus 2 NOT DETECTED NOT DETECTED Final   Parainfluenza Virus 3 NOT DETECTED NOT DETECTED Final   Parainfluenza Virus 4 NOT DETECTED NOT DETECTED Final   Respiratory Syncytial Virus NOT DETECTED NOT DETECTED Final   Bordetella pertussis NOT DETECTED NOT DETECTED Final   Bordetella Parapertussis NOT DETECTED NOT DETECTED Final   Chlamydophila pneumoniae NOT DETECTED NOT DETECTED Final   Mycoplasma pneumoniae NOT DETECTED NOT DETECTED Final    Comment: Performed at [facility], [address], [place], [place] [address]  MRSA Next Gen by PCR, Nasal     Status: None   Collection Time: 07/20/22 11:48 PM   Specimen: Nasal Mucosa; Nasal Swab  Result Value Ref Range Status   MRSA by PCR Next Gen NOT DETECTED NOT DETECTED Final    Comment: (NOTE) The GeneXpert MRSA Assay (FDA approved for NASAL specimens only), is one component of a comprehensive MRSA colonization  surveillance program. It is not intended to diagnose MRSA infection nor to guide or monitor treatment for MRSA infections. Test performance is not FDA approved in patients less than 57 years old. Performed at [facility], [address] [place], [place] [address]     Coagulation Studies: No results for input(s): "LABPROT", "INR" in the last 72 hours.  Urinalysis: No results for input(s): "COLORURINE", "LABSPEC", "PHURINE", "GLUCOSEU", "HGBUR", "BILIRUBINUR", "KETONESUR", "PROTEINUR", "UROBILINOGEN", "NITRITE", "LEUKOCYTESUR" in the last 72 hours.  Invalid input(s): "APPERANCEUR"    Imaging: DG Chest 2 View  Result Date: 07/20/2022 CLINICAL DATA:  Provided history: Painful cough. Additional history provided: Cough. Wheezing. Chills. EXAM: CHEST - 2 VIEW COMPARISON:  Prior chest radiographs 07/15/2022 and earlier. FINDINGS: Heart size within normal limits. Patchy and hazy pulmonary opacities bilaterally, new from the prior examination of 07/15/2022. Chronic elevation of the right hemidiaphragm. No evidence of pleural effusion or pneumothorax. No acute osseous abnormality identified. Dextrocurvature of the imaged thoracolumbar spine. A vascular stent projects in the right subclavian/axillary region. Residual contrast within the colon. IMPRESSION: 1. Patchy  and hazy pulmonary opacities bilaterally, new from the prior examination of 07/15/2022. Findings may reflect infection and/or edema. 2. Chronic elevation of the right hemidiaphragm. 3. Dextrocurvature of the imaged thoracolumbar spine. Electronically Signed   By: Jackey Loge D.O.   On: 07/20/2022 11:24     Medications:    anticoagulant sodium citrate     azithromycin 500 mg (07/20/22 2021)   cefTRIAXone (ROCEPHIN)  IV 2 g (07/20/22 2107)    allopurinol  100 mg Oral QHS   amLODipine  10 mg Oral Daily   benzonatate  200 mg Oral TID   carbidopa-levodopa  1 tablet Oral TID   Chlorhexidine Gluconate Cloth  6 each Topical  Q0600   chlorpheniramine-HYDROcodone  5 mL Oral Q12H   DULoxetine  30 mg Oral Daily   ezetimibe  10 mg Oral Daily   gabapentin  300 mg Oral TID   guaiFENesin  600 mg Oral BID   heparin  5,000 Units Subcutaneous Q8H   hydrALAZINE  50 mg Oral Q8H   isosorbide mononitrate  60 mg Oral QPM   metoprolol succinate  50 mg Oral QPM   rOPINIRole  1 mg Oral QHS   sodium chloride flush  3 mL Intravenous Q12H   vortioxetine HBr  10 mg Oral Daily   acetaminophen **OR** acetaminophen, ALPRAZolam, alteplase, anticoagulant sodium citrate, guaiFENesin, heparin, hydrALAZINE, lidocaine (PF), lidocaine-prilocaine, morphine injection, ondansetron **OR** ondansetron (ZOFRAN) IV, pentafluoroprop-tetrafluoroeth  Assessment/ Plan:  [name] is a 47 y.o.  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 MWF [facility] Left AVF 108kg.   Hyperkalemia with End Stage Renal Disease on hemodialysis:  - Potassium corrected to 4.5.  - Successful treatment completed yesterday with 2L UF removal.  Continue MWF schedule.   Hypertension with chronic kidney disease:  - Blood pressure remains elevated, 213/101 - Receiving ezetimbe, hydralazine, isosorbide, and metoprolol.  - Added Amlodipine 10mg  daily  Anemia of chronic kidney disease: hemoglobin 8.5. Mircera as outpatient.   - ESA held due to hypertension   Secondary Hyperparathyroidism:  - Calcium within target Continue Lanthanum.    LOS: 0   7/16/202410:44 AM

## 2022-07-22 LAB — CBC
HCT: 23.9 % — ABNORMAL LOW (ref 36.0–46.0)
Hemoglobin: 7.6 g/dL — ABNORMAL LOW (ref 12.0–15.0)
MCH: 26.4 pg (ref 26.0–34.0)
MCHC: 31.8 g/dL (ref 30.0–36.0)
MCV: 83 fL (ref 80.0–100.0)
Platelets: 237 10*3/uL (ref 150–400)
RBC: 2.88 MIL/uL — ABNORMAL LOW (ref 3.87–5.11)
RDW: 16.7 % — ABNORMAL HIGH (ref 11.5–15.5)
WBC: 7.3 10*3/uL (ref 4.0–10.5)
nRBC: 0.6 % — ABNORMAL HIGH (ref 0.0–0.2)

## 2022-07-22 LAB — RENAL FUNCTION PANEL
Albumin: 3.5 g/dL (ref 3.5–5.0)
Anion gap: 13 (ref 5–15)
BUN: 52 mg/dL — ABNORMAL HIGH (ref 6–20)
CO2: 27 mmol/L (ref 22–32)
Calcium: 8.7 mg/dL — ABNORMAL LOW (ref 8.9–10.3)
Chloride: 92 mmol/L — ABNORMAL LOW (ref 98–111)
Creatinine, Ser: 8.27 mg/dL — ABNORMAL HIGH (ref 0.44–1.00)
GFR, Estimated: 6 mL/min — ABNORMAL LOW (ref >60)
Glucose, Bld: 103 mg/dL — ABNORMAL HIGH (ref 70–99)
Phosphorus: 6.4 mg/dL — ABNORMAL HIGH (ref 2.5–4.6)
Potassium: 5.3 mmol/L — ABNORMAL HIGH (ref 3.5–5.1)
Sodium: 132 mmol/L — ABNORMAL LOW (ref 135–145)

## 2022-07-22 LAB — D-DIMER, QUANTITATIVE: D-Dimer, Quant: 2.03 ug/mL-FEU — ABNORMAL HIGH (ref 0.00–0.50)

## 2022-07-22 MED ORDER — PENTAFLUOROPROP-TETRAFLUOROETH EX AERO
INHALATION_SPRAY | CUTANEOUS | Status: AC
  Filled 2022-07-22: qty 30

## 2022-07-22 MED ORDER — HYDROXYZINE HCL 25 MG PO TABS
25.0000 mg | ORAL_TABLET | Freq: Three times a day (TID) | ORAL | Status: DC | PRN
  Administered 2022-07-22 – 2022-07-24 (×5): 25 mg via ORAL
  Filled 2022-07-22 (×6): qty 1

## 2022-07-22 NOTE — Plan of Care (Signed)
   Problem: Health Behavior/Discharge Planning: Goal: Ability to manage health-related needs will improve Outcome: Progressing   Problem: Clinical Measurements: Goal: Ability to maintain clinical measurements within normal limits will improve Outcome: Progressing   Problem: Clinical Measurements: Goal: Will remain free from infection Outcome: Progressing   Problem: Clinical Measurements: Goal: Diagnostic test results will improve Outcome: Progressing   

## 2022-07-22 NOTE — Progress Notes (Signed)
Pt was placed on tx at 0810.  Within a few min, machine error of flow balance.  Has to terminate tx and rinse pt back.  Machine set back up and still unable to pt pt back on d/t more alarms and errors.  Pt moved to different bay with different machine.  Machhine set back up and pt started tx at 0905am  MD and NP made aware

## 2022-07-22 NOTE — Progress Notes (Signed)
  Received patient in bed to unit.   Informed consent signed and in chart.    TX duration:3.5hrs     Transported by  Hand-off given to patient's nurse. No c/o and no distress noted    Access used: R AVF Access issues: none   Total UF removed: 1.5L Medication(s) given: none Post HD VS: 189/79 Post HD weight: 108.0kg     Lynann Beaver  [facility]

## 2022-07-22 NOTE — Progress Notes (Signed)
PROGRESS NOTE    Patricia [name]  KVQ:[id number] DOB: 1975-12-07 DOA: 07/20/2022 PCP: Rick Duff, PA-C    Brief Narrative:  47 y.o. female with medical history significant of ESRD on HD 2/2 [facility] on organ transplant list, hypertension, iron deficiency anemia, anemia of chronic kidney disease, HFpEF, who presents to the ED due to cough and shortness of breath.   Patricia [name] states that for the last couple days, she has been feeling feverish with chills.  Then yesterday, she noted that she felt generalized malaise with fatigue.  Then yesterday evening into the night, she developed shortness of breath with a nonproductive cough that has been progressively worsening.  She endorses chest pain only while coughing.  She endorses nausea but denies any vomiting, abdominal pain.  She notes that her feet were swollen yesterday, however swelling did not go any higher than her feet.  She states that her shortness of breath at this time feels different than her previous episodes of pulmonary edema.  She has not missed any dialysis sessions.  7/16: Patient underwent hemodialysis on 7/15 with 2 L fluid removal.  Remains with cough.  Blood pressure markedly elevated.   Assessment & Plan:   Principal Problem:   Acute respiratory distress Active Problems:   ESRD on dialysis (HCC)   Hypertension   Anemia due to chronic kidney disease   Idiopathic peripheral neuropathy   Type 2 diabetes mellitus with other diabetic kidney complication (HCC)   CAP (community acquired pneumonia)  * Acute respiratory distress Community-acquired pneumonia Patient is presenting with several day history of fever/chills, 1 day of malaise/fatigue and development of nonproductive cough and shortness of breath over the last 12 to 15 hours.  Chest x-ray with bilateral opacities concerning for pneumonia versus edema.   RVP negative Procalcitonin mildly elevated Plan: Continue Rocephin Continue azithromycin DC  droplet precautions Supplemental oxygen as necessary Monitor vitals and fever curve F/u dimer  Hypoxic respiratory failure Weaned off o2 at rest - PT eval  Hypertensive urgency Blood pressure remains elevated despite receiving home metoprolol and Imdur on 7/15.  She also received IV and p.o. hydralazine on 7/16. Bp improving Plan: Continue metoprolol Continue Imdur Added hydralazine 50 mg every 8 hours Added amlodipine 10 mg daily IV hydralazine as needed  ESRD on dialysis [facility]) Nephrology on consult [facility] dialysis, received today Nephrology to follow-up regarding repeat HD   Idiopathic peripheral neuropathy Continue home regimen of gabapentin and Requip  Abnormal facial movement Patient on Sinemet.  Compliance unclear.  Indication unclear.  Will need outpatient referral to neurology at time of discharge   Anemia due to chronic kidney disease Hemoglobin is stable at this time with no evidence of bleeding on examination  Morbid obesity BMI 39.09.  Meets criteria for morbid obesity given chronic comorbidities.  This complicates overall care and prognosis.   DVT prophylaxis: SQ heparin Code Status: FULL Family Communication:None Disposition Plan: Status is: inpt   Level of care: Telemetry Medical  Consultants:  Nephrology  Procedures:  None  Antimicrobials: Rocephin Azithromycin   Subjective: Seen and examined.  Coughing, short of breath. Improving but still with cough  Objective: Vitals:   07/22/22 1241 07/22/22 1259 07/22/22 1341 07/22/22 1533  BP: (!) 189/79  (!) 177/87 (!) 159/81  Pulse: 86  90 92  Resp: 20  20 18   Temp: 97.6 F (36.4 C)  98 F (36.7 C) 98 F (36.7 C)  TempSrc: Oral     SpO2: 100%  96% 100%  Weight:  108 kg    Height:        Intake/Output Summary (Last 24 hours) at 07/22/2022 1636 Last data filed at 07/22/2022 1241 Gross per 24 hour  Intake 220 ml  Output 1500 ml  Net -1280 ml   Filed Weights   07/22/22 0500 07/22/22 0804  07/22/22 1259  Weight: 116.4 kg 109.5 kg 108 kg    Examination:  General exam: Appears uncomfortable Respiratory system: faint  rhonchi bilaterally.  Normal work of breathing.    Cardiovascular system: S1-S2, RRR, no murmurs, no pedal edema Gastrointestinal system: Obese, soft, NT/ND, normal bowel sounds Central nervous system: Alert and oriented. No focal neurological deficits. Extremities: Symmetric 5 x 5 power. Skin: No rashes, lesions or ulcers Psychiatry: Judgement and insight appear normal. Mood & affect appropriate.     Data Reviewed: I have personally reviewed following labs and imaging studies  CBC: Recent Labs  Lab 07/15/22 2110 07/20/22 1227 07/21/22 0413 07/22/22 0810  WBC 4.3 6.8 8.0 7.3  NEUTROABS 2.4  --   --   --   HGB 7.3* 8.1* 8.5* 7.6*  HCT 24.0* 26.3* 26.8* 23.9*  MCV 85.4 86.5 83.0 83.0  PLT 214 258 281 237   Basic Metabolic Panel: Recent Labs  Lab 07/15/22 2110 07/20/22 1227 07/21/22 0413 07/22/22 0810  NA 139 141 134* 132*  K 3.6 6.1* 4.5 5.3*  CL 95* 102 92* 92*  CO2 31 26 28 27   GLUCOSE 109* 101* 105* 103*  BUN 25* 70* 37* 52*  CREATININE 5.15* 9.90* 6.18* 8.27*  CALCIUM 8.4* 9.1 8.9 8.7*  PHOS  --   --   --  6.4*   GFR: Estimated Creatinine Clearance: 10.5 mL/min (A) (by C-G formula based on SCr of 8.27 mg/dL (H)). Liver Function Tests: Recent Labs  Lab 07/15/22 2110 07/20/22 1227 07/22/22 0810  AST 41 43*  --   ALT 16 9  --   ALKPHOS 82 80  --   BILITOT 0.4 0.4  --   PROT 7.2 7.3  --   ALBUMIN 3.6 3.5 3.5   No results for input(s): "LIPASE", "AMYLASE" in the last 168 hours. No results for input(s): "AMMONIA" in the last 168 hours. Coagulation Profile: No results for input(s): "INR", "PROTIME" in the last 168 hours. Cardiac Enzymes: No results for input(s): "CKTOTAL", "CKMB", "CKMBINDEX", "TROPONINI" in the last 168 hours. BNP (last 3 results) No results for input(s): "PROBNP" in the last 8760 hours. HbA1C: No results  for input(s): "HGBA1C" in the last 72 hours. CBG: No results for input(s): "GLUCAP" in the last 168 hours. Lipid Profile: No results for input(s): "CHOL", "HDL", "LDLCALC", "TRIG", "CHOLHDL", "LDLDIRECT" in the last 72 hours. Thyroid Function Tests: No results for input(s): "TSH", "T4TOTAL", "FREET4", "T3FREE", "THYROIDAB" in the last 72 hours. Anemia Panel: No results for input(s): "VITAMINB12", "FOLATE", "FERRITIN", "TIBC", "IRON", "RETICCTPCT" in the last 72 hours. Sepsis Labs: Recent Labs  Lab 07/20/22 1227  PROCALCITON 0.30    Recent Results (from the past 240 hour(s))  SARS Coronavirus 2 by RT PCR (hospital order, performed in [facility] hospital lab) *cepheid single result test* Anterior Nasal Swab     Status: None   Collection Time: 07/20/22 10:53 AM   Specimen: Anterior Nasal Swab  Result Value Ref Range Status   SARS Coronavirus 2 by RT PCR NEGATIVE NEGATIVE Final    Comment: (NOTE) SARS-CoV-2 target nucleic acids are NOT DETECTED.  The SARS-CoV-2 RNA is generally detectable in upper and lower respiratory  specimens during the acute phase of infection. The lowest concentration of SARS-CoV-2 viral copies this assay can detect is 250 copies / mL. A negative result does not preclude SARS-CoV-2 infection and should not be used as the sole basis for treatment or other patient management decisions.  A negative result may occur with improper specimen collection / handling, submission of specimen other than nasopharyngeal swab, presence of viral mutation(s) within the areas targeted by this assay, and inadequate number of viral copies (<250 copies / mL). A negative result must be combined with clinical observations, patient history, and epidemiological information.  Fact Sheet for Patients:   RoadLapTop.co.za  Fact Sheet for Healthcare Providers: http://kim-miller.com/  This test is not yet approved or  cleared by the [place] FDA and has been authorized for detection and/or diagnosis of SARS-CoV-2 by FDA under an Emergency Use Authorization (EUA).  This EUA will remain in effect (meaning this test can be used) for the duration of the COVID-19 declaration under Section 564(b)(1) of the Act, 21 U.S.C. section 360bbb-3(b)(1), unless the authorization is terminated or revoked sooner.  Performed at [facility], [address] [place], [place] [address]   Respiratory (~20 pathogens) panel by PCR     Status: None   Collection Time: 07/20/22 11:48 PM   Specimen: Nasopharyngeal Swab; Respiratory  Result Value Ref Range Status   Adenovirus NOT DETECTED NOT DETECTED Final   Coronavirus 229E NOT DETECTED NOT DETECTED Final    Comment: (NOTE) The Coronavirus on the Respiratory Panel, DOES NOT test for the novel  Coronavirus (2019 nCoV)    Coronavirus HKU1 NOT DETECTED NOT DETECTED Final   Coronavirus NL63 NOT DETECTED NOT DETECTED Final   Coronavirus OC43 NOT DETECTED NOT DETECTED Final   Metapneumovirus NOT DETECTED NOT DETECTED Final   Rhinovirus / Enterovirus NOT DETECTED NOT DETECTED Final   Influenza A NOT DETECTED NOT DETECTED Final   Influenza B NOT DETECTED NOT DETECTED Final   Parainfluenza Virus 1 NOT DETECTED NOT DETECTED Final   Parainfluenza Virus 2 NOT DETECTED NOT DETECTED Final   Parainfluenza Virus 3 NOT DETECTED NOT DETECTED Final   Parainfluenza Virus 4 NOT DETECTED NOT DETECTED Final   Respiratory Syncytial Virus NOT DETECTED NOT DETECTED Final   Bordetella pertussis NOT DETECTED NOT DETECTED Final   Bordetella Parapertussis NOT DETECTED NOT DETECTED Final   Chlamydophila pneumoniae NOT DETECTED NOT DETECTED Final   Mycoplasma pneumoniae NOT DETECTED NOT DETECTED Final    Comment: Performed at [facility], [address], [place], [place] [address]  MRSA Next Gen by PCR, Nasal     Status: None   Collection Time: 07/20/22 11:48 PM   Specimen: Nasal Mucosa; Nasal Swab   Result Value Ref Range Status   MRSA by PCR Next Gen NOT DETECTED NOT DETECTED Final    Comment: (NOTE) The GeneXpert MRSA Assay (FDA approved for NASAL specimens only), is one component of a comprehensive MRSA colonization surveillance program. It is not intended to diagnose MRSA infection nor to guide or monitor treatment for MRSA infections. Test performance is not FDA approved in patients less than 58 years old. Performed at [facility], [address], [place], [place] [address]          Radiology Studies: No results found.      Scheduled Meds:  allopurinol  100 mg Oral QHS   amLODipine  10 mg Oral Daily   benzonatate  200 mg Oral TID   carbidopa-levodopa  1 tablet Oral TID   Chlorhexidine Gluconate Cloth  6 each Topical Q0600   chlorpheniramine-HYDROcodone  5 mL Oral Q12H   DULoxetine  30 mg Oral Daily   ezetimibe  10 mg Oral Daily   gabapentin  300 mg Oral TID   guaiFENesin  600 mg Oral BID   heparin  5,000 Units Subcutaneous Q8H   hydrALAZINE  50 mg Oral Q8H   isosorbide mononitrate  60 mg Oral QPM   metoprolol succinate  50 mg Oral QPM   rOPINIRole  1 mg Oral QHS   sodium chloride flush  3 mL Intravenous Q12H   vortioxetine HBr  10 mg Oral Daily   Continuous Infusions:  anticoagulant sodium citrate     cefTRIAXone (ROCEPHIN)  IV 2 g (07/22/22 1349)   promethazine (PHENERGAN) injection (IM or IVPB) 12.5 mg (07/21/22 2213)     LOS: 1 day   Silvano Bilis, MD [facility]   If 7PM-7AM, please contact night-coverage  07/22/2022, 4:36 PM

## 2022-07-22 NOTE — Progress Notes (Signed)
[facility]  ROUNDING NOTE   Subjective:   Ms. Lyrique Hakim was admitted to [facility] on 07/20/2022 for Acute respiratory distress [R06.03] Acute pulmonary edema (HCC) [J81.0] CAP (community acquired pneumonia) [J18.9]  Patient seen and evaluated during dialysis   HEMODIALYSIS FLOWSHEET:  Blood Flow Rate (mL/min): 350 mL/min Arterial Pressure (mmHg): -80 mmHg Venous Pressure (mmHg): 250 mmHg TMP (mmHg): 20 mmHg Ultrafiltration Rate (mL/min): 686 mL/min Dialysate Flow Rate (mL/min): 300 ml/min Dialysis Fluid Bolus: Normal Saline  Resting well during treatment  Objective:  Vital signs in last 24 hours:  Temp:  [98 F (36.7 C)-98.8 F (37.1 C)] 98.1 F (36.7 C) (07/17 0756) Pulse Rate:  [79-87] 87 (07/17 1100) Resp:  [18-29] 29 (07/17 1100) BP: (131-193)/(74-139) 177/139 (07/17 1100) SpO2:  [95 %-100 %] 99 % (07/17 1100) Weight:  [109.5 kg-116.4 kg] 109.5 kg (07/17 0804)  Weight change: 10.7 kg Filed Weights   07/20/22 1953 07/22/22 0500 07/22/22 0804  Weight: 113.2 kg 116.4 kg 109.5 kg    Intake/Output: I/O last 3 completed shifts: In: 220 [P.O.:220] Out: -    Intake/Output this shift:  No intake/output data recorded.  Physical Exam: General: NAD, laying in stretcher  Head: Normocephalic, atraumatic. Moist oral mucosal membranes  Eyes: Anicteric  Lungs:   Normal effort  Heart: Regular rate and rhythm  Abdomen:  Soft, nontender  Extremities:  no peripheral edema.  Neurologic: Alert and oriented, moving all four extremities  Skin: No lesions  Access: Left AVF    Basic Metabolic Panel: Recent Labs  Lab 07/15/22 2110 07/20/22 1227 07/21/22 0413 07/22/22 0810  NA 139 141 134* 132*  K 3.6 6.1* 4.5 5.3*  CL 95* 102 92* 92*  CO2 31 26 28 27   GLUCOSE 109* 101* 105* 103*  BUN 25* 70* 37* 52*  CREATININE 5.15* 9.90* 6.18* 8.27*  CALCIUM 8.4* 9.1 8.9 8.7*  PHOS  --   --   --  6.4*    Liver Function Tests: Recent Labs  Lab  07/15/22 2110 07/20/22 1227 07/22/22 0810  AST 41 43*  --   ALT 16 9  --   ALKPHOS 82 80  --   BILITOT 0.4 0.4  --   PROT 7.2 7.3  --   ALBUMIN 3.6 3.5 3.5   No results for input(s): "LIPASE", "AMYLASE" in the last 168 hours. No results for input(s): "AMMONIA" in the last 168 hours.  CBC: Recent Labs  Lab 07/15/22 2110 07/20/22 1227 07/21/22 0413 07/22/22 0810  WBC 4.3 6.8 8.0 7.3  NEUTROABS 2.4  --   --   --   HGB 7.3* 8.1* 8.5* 7.6*  HCT 24.0* 26.3* 26.8* 23.9*  MCV 85.4 86.5 83.0 83.0  PLT 214 258 281 237    Cardiac Enzymes: No results for input(s): "CKTOTAL", "CKMB", "CKMBINDEX", "TROPONINI" in the last 168 hours.  BNP: Invalid input(s): "POCBNP"  CBG: No results for input(s): "GLUCAP" in the last 168 hours.  Microbiology: Results for orders placed or performed during the hospital encounter of 07/20/22  SARS Coronavirus 2 by RT PCR (hospital order, performed in [facility] hospital lab) *cepheid single result test* Anterior Nasal Swab     Status: None   Collection Time: 07/20/22 10:53 AM   Specimen: Anterior Nasal Swab  Result Value Ref Range Status   SARS Coronavirus 2 by RT PCR NEGATIVE NEGATIVE Final    Comment: (NOTE) SARS-CoV-2 target nucleic acids are NOT DETECTED.  The SARS-CoV-2 RNA is generally detectable in upper and  lower respiratory specimens during the acute phase of infection. The lowest concentration of SARS-CoV-2 viral copies this assay can detect is 250 copies / mL. A negative result does not preclude SARS-CoV-2 infection and should not be used as the sole basis for treatment or other patient management decisions.  A negative result may occur with improper specimen collection / handling, submission of specimen other than nasopharyngeal swab, presence of viral mutation(s) within the areas targeted by this assay, and inadequate number of viral copies (<250 copies / mL). A negative result must be combined with clinical observations, patient  history, and epidemiological information.  Fact Sheet for Patients:   RoadLapTop.co.za  Fact Sheet for Healthcare Providers: http://kim-miller.com/  This test is not yet approved or  cleared by the [place] FDA and has been authorized for detection and/or diagnosis of SARS-CoV-2 by FDA under an Emergency Use Authorization (EUA).  This EUA will remain in effect (meaning this test can be used) for the duration of the COVID-19 declaration under Section 564(b)(1) of the Act, 21 U.S.C. section 360bbb-3(b)(1), unless the authorization is terminated or revoked sooner.  Performed at [facility], [address] [place], [place] [address]   Respiratory (~20 pathogens) panel by PCR     Status: None   Collection Time: 07/20/22 11:48 PM   Specimen: Nasopharyngeal Swab; Respiratory  Result Value Ref Range Status   Adenovirus NOT DETECTED NOT DETECTED Final   Coronavirus 229E NOT DETECTED NOT DETECTED Final    Comment: (NOTE) The Coronavirus on the Respiratory Panel, DOES NOT test for the novel  Coronavirus (2019 nCoV)    Coronavirus HKU1 NOT DETECTED NOT DETECTED Final   Coronavirus NL63 NOT DETECTED NOT DETECTED Final   Coronavirus OC43 NOT DETECTED NOT DETECTED Final   Metapneumovirus NOT DETECTED NOT DETECTED Final   Rhinovirus / Enterovirus NOT DETECTED NOT DETECTED Final   Influenza A NOT DETECTED NOT DETECTED Final   Influenza B NOT DETECTED NOT DETECTED Final   Parainfluenza Virus 1 NOT DETECTED NOT DETECTED Final   Parainfluenza Virus 2 NOT DETECTED NOT DETECTED Final   Parainfluenza Virus 3 NOT DETECTED NOT DETECTED Final   Parainfluenza Virus 4 NOT DETECTED NOT DETECTED Final   Respiratory Syncytial Virus NOT DETECTED NOT DETECTED Final   Bordetella pertussis NOT DETECTED NOT DETECTED Final   Bordetella Parapertussis NOT DETECTED NOT DETECTED Final   Chlamydophila pneumoniae NOT DETECTED NOT DETECTED Final    Mycoplasma pneumoniae NOT DETECTED NOT DETECTED Final    Comment: Performed at [facility], [address] [place], [place] [address]  MRSA Next Gen by PCR, Nasal     Status: None   Collection Time: 07/20/22 11:48 PM   Specimen: Nasal Mucosa; Nasal Swab  Result Value Ref Range Status   MRSA by PCR Next Gen NOT DETECTED NOT DETECTED Final    Comment: (NOTE) The GeneXpert MRSA Assay (FDA approved for NASAL specimens only), is one component of a comprehensive MRSA colonization surveillance program. It is not intended to diagnose MRSA infection nor to guide or monitor treatment for MRSA infections. Test performance is not FDA approved in patients less than 44 years old. Performed at [facility], [address] [place], [place] [address]     Coagulation Studies: No results for input(s): "LABPROT", "INR" in the last 72 hours.  Urinalysis: No results for input(s): "COLORURINE", "LABSPEC", "PHURINE", "GLUCOSEU", "HGBUR", "BILIRUBINUR", "KETONESUR", "PROTEINUR", "UROBILINOGEN", "NITRITE", "LEUKOCYTESUR" in the last 72 hours.  Invalid input(s): "APPERANCEUR"  Imaging: No results found.   Medications:    anticoagulant sodium citrate     azithromycin 500 mg (07/21/22 1623)   cefTRIAXone (ROCEPHIN)  IV 2 g (07/21/22 1503)   promethazine (PHENERGAN) injection (IM or IVPB) 12.5 mg (07/21/22 2213)    allopurinol  100 mg Oral QHS   amLODipine  10 mg Oral Daily   benzonatate  200 mg Oral TID   carbidopa-levodopa  1 tablet Oral TID   Chlorhexidine Gluconate Cloth  6 each Topical Q0600   chlorpheniramine-HYDROcodone  5 mL Oral Q12H   DULoxetine  30 mg Oral Daily   ezetimibe  10 mg Oral Daily   gabapentin  300 mg Oral TID   guaiFENesin  600 mg Oral BID   heparin  5,000 Units Subcutaneous Q8H   hydrALAZINE  50 mg Oral Q8H   isosorbide mononitrate  60 mg Oral QPM   metoprolol succinate  50 mg Oral QPM   rOPINIRole  1 mg Oral QHS   sodium chloride flush  3 mL  Intravenous Q12H   vortioxetine HBr  10 mg Oral Daily   acetaminophen **OR** acetaminophen, ALPRAZolam, alteplase, anticoagulant sodium citrate, guaiFENesin, heparin, hydrALAZINE, ipratropium-albuterol, lidocaine (PF), lidocaine-prilocaine, morphine injection, ondansetron **OR** ondansetron (ZOFRAN) IV, pentafluoroprop-tetrafluoroeth, promethazine (PHENERGAN) injection (IM or IVPB)  Assessment/ Plan:  Ms. Inell Mimbs is a 47 y.o.  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   Hyperkalemia with End Stage Renal Disease on hemodialysis:  - Potassium corrected  - Receiving treatment today, UF 1.5L as tolerated.  - Continue MWF schedule.   Hypertension with chronic kidney disease:  - Blood pressure remains elevated, 213/101 - Receiving amlodipine, hydralazine, isosorbide, and metoprolol.  - Blood pressure 189/95 during dialysis  Anemia of chronic kidney disease: hemoglobin 7.6 Mircera as outpatient.   - Hgb below desired target  - Will hold ESA due to hypotension.   Secondary Hyperparathyroidism:  - Will continue to monitor bone minerals. - Continue Lanthanum.    LOS: 1   7/17/202411:36 AM

## 2022-07-23 ENCOUNTER — Inpatient Hospital Stay: Payer: Medicare Other

## 2022-07-23 ENCOUNTER — Inpatient Hospital Stay (HOSPITAL_COMMUNITY)
Admit: 2022-07-23 | Discharge: 2022-07-23 | Disposition: A | Discharge status: Home / Self Care | Payer: Medicare Other | Attending: Obstetrics and Gynecology | Admitting: Obstetrics and Gynecology

## 2022-07-23 DIAGNOSIS — R0609 Other forms of dyspnea: Secondary | ICD-10-CM

## 2022-07-23 LAB — BASIC METABOLIC PANEL
Anion gap: 13 (ref 5–15)
BUN: 35 mg/dL — ABNORMAL HIGH (ref 6–20)
CO2: 25 mmol/L (ref 22–32)
Calcium: 9 mg/dL (ref 8.9–10.3)
Chloride: 93 mmol/L — ABNORMAL LOW (ref 98–111)
Creatinine, Ser: 6.57 mg/dL — ABNORMAL HIGH (ref 0.44–1.00)
GFR, Estimated: 7 mL/min — ABNORMAL LOW (ref >60)
Glucose, Bld: 100 mg/dL — ABNORMAL HIGH (ref 70–99)
Potassium: 5.1 mmol/L (ref 3.5–5.1)
Sodium: 131 mmol/L — ABNORMAL LOW (ref 135–145)

## 2022-07-23 LAB — CBC
HCT: 27.8 % — ABNORMAL LOW (ref 36.0–46.0)
Hemoglobin: 8.7 g/dL — ABNORMAL LOW (ref 12.0–15.0)
MCH: 26.6 pg (ref 26.0–34.0)
MCHC: 31.3 g/dL (ref 30.0–36.0)
MCV: 85 fL (ref 80.0–100.0)
Platelets: 217 10*3/uL (ref 150–400)
RBC: 3.27 MIL/uL — ABNORMAL LOW (ref 3.87–5.11)
RDW: 16.7 % — ABNORMAL HIGH (ref 11.5–15.5)
WBC: 7 10*3/uL (ref 4.0–10.5)
nRBC: 0.6 % — ABNORMAL HIGH (ref 0.0–0.2)

## 2022-07-23 LAB — BRAIN NATRIURETIC PEPTIDE: B Natriuretic Peptide: 885.2 pg/mL — ABNORMAL HIGH (ref 0.0–100.0)

## 2022-07-23 LAB — SARS CORONAVIRUS 2 BY RT PCR: SARS Coronavirus 2 by RT PCR: NEGATIVE

## 2022-07-23 MED ORDER — SENNA 8.6 MG PO TABS
2.0000 | ORAL_TABLET | Freq: Every day | ORAL | Status: DC
  Administered 2022-07-23 – 2022-07-24 (×2): 17.2 mg via ORAL
  Filled 2022-07-23 (×2): qty 2

## 2022-07-23 MED ORDER — TECHNETIUM TO 99M ALBUMIN AGGREGATED
4.0000 | Freq: Once | INTRAVENOUS | Status: AC | PRN
  Administered 2022-07-23: 4.37 via INTRAVENOUS

## 2022-07-23 MED ORDER — PREDNISONE 20 MG PO TABS
40.0000 mg | ORAL_TABLET | Freq: Every day | ORAL | Status: DC
  Administered 2022-07-23: 40 mg via ORAL
  Filled 2022-07-23: qty 2

## 2022-07-23 MED ORDER — CARVEDILOL 25 MG PO TABS
25.0000 mg | ORAL_TABLET | Freq: Two times a day (BID) | ORAL | Status: DC
  Administered 2022-07-23 – 2022-07-24 (×2): 25 mg via ORAL
  Filled 2022-07-23 (×2): qty 1

## 2022-07-23 NOTE — Progress Notes (Signed)
[facility]  ROUNDING NOTE   Subjective:   [name] was admitted to [facility] on 07/20/2022 for Acute respiratory distress [R06.03] Acute pulmonary edema (HCC) [J81.0] CAP (community acquired pneumonia) [J18.9]  Patient resting quietly Continues to complain of shortness of breath Non-productive cough Fatigue and weakness Unable to rest at night, anxiety  Objective:  Vital signs in last 24 hours:  Temp:  [98 F (36.7 C)-98.4 F (36.9 C)] 98 F (36.7 C) (07/18 0827) Pulse Rate:  [85-95] 95 (07/18 1142) Resp:  [18-20] 20 (07/18 0846) BP: (159-202)/(79-103) 184/94 (07/18 1142) SpO2:  [96 %-100 %] 100 % (07/18 0846) Weight:  [108 kg-113.6 kg] 113.6 kg (07/18 0500)  Weight change: -6.9 kg Filed Weights   07/22/22 0804 07/22/22 1259 07/23/22 0500  Weight: 109.5 kg 108 kg 113.6 kg    Intake/Output: I/O last 3 completed shifts: In: 340 [P.O.:340] Out: 1500 [Other:1500]   Intake/Output this shift:  Total I/O In: 480 [P.O.:480] Out: -   Physical Exam: General: NAD, laying in stretcher  Head: Normocephalic, atraumatic. Moist oral mucosal membranes  Eyes: Anicteric  Lungs:   Normal effort  Heart: Regular rate and rhythm  Abdomen:  Soft, nontender  Extremities:  no peripheral edema.  Neurologic: Alert and oriented, moving all four extremities  Skin: No lesions  Access: Left AVF    Basic Metabolic Panel: Recent Labs  Lab 07/20/22 1227 07/21/22 0413 07/22/22 0810 07/23/22 1050  NA 141 134* 132* 131*  K 6.1* 4.5 5.3* 5.1  CL 102 92* 92* 93*  CO2 26 28 27 25   GLUCOSE 101* 105* 103* 100*  BUN 70* 37* 52* 35*  CREATININE 9.90* 6.18* 8.27* 6.57*  CALCIUM 9.1 8.9 8.7* 9.0  PHOS  --   --  6.4*  --     Liver Function Tests: Recent Labs  Lab 07/20/22 1227 07/22/22 0810  AST 43*  --   ALT 9  --   ALKPHOS 80  --   BILITOT 0.4  --   PROT 7.3  --   ALBUMIN 3.5 3.5   No results for input(s): "LIPASE", "AMYLASE" in the last 168  hours. No results for input(s): "AMMONIA" in the last 168 hours.  CBC: Recent Labs  Lab 07/20/22 1227 07/21/22 0413 07/22/22 0810 07/23/22 1050  WBC 6.8 8.0 7.3 7.0  HGB 8.1* 8.5* 7.6* 8.7*  HCT 26.3* 26.8* 23.9* 27.8*  MCV 86.5 83.0 83.0 85.0  PLT 258 281 237 217    Cardiac Enzymes: No results for input(s): "CKTOTAL", "CKMB", "CKMBINDEX", "TROPONINI" in the last 168 hours.  BNP: Invalid input(s): "POCBNP"  CBG: No results for input(s): "GLUCAP" in the last 168 hours.  Microbiology: Results for orders placed or performed during the hospital encounter of 07/20/22  SARS Coronavirus 2 by RT PCR (hospital order, performed in [facility] hospital lab) *cepheid single result test* Anterior Nasal Swab     Status: None   Collection Time: 07/20/22 10:53 AM   Specimen: Anterior Nasal Swab  Result Value Ref Range Status   SARS Coronavirus 2 by RT PCR NEGATIVE NEGATIVE Final    Comment: (NOTE) SARS-CoV-2 target nucleic acids are NOT DETECTED.  The SARS-CoV-2 RNA is generally detectable in upper and lower respiratory specimens during the acute phase of infection. The lowest concentration of SARS-CoV-2 viral copies this assay can detect is 250 copies / mL. A negative result does not preclude SARS-CoV-2 infection and should not be used as the sole basis for treatment or other patient  management decisions.  A negative result may occur with improper specimen collection / handling, submission of specimen other than nasopharyngeal swab, presence of viral mutation(s) within the areas targeted by this assay, and inadequate number of viral copies (<250 copies / mL). A negative result must be combined with clinical observations, patient history, and epidemiological information.  Fact Sheet for Patients:   RoadLapTop.co.za  Fact Sheet for Healthcare Providers: http://kim-miller.com/  This test is not yet approved or  cleared by the [place] FDA and has been authorized for detection and/or diagnosis of SARS-CoV-2 by FDA under an Emergency Use Authorization (EUA).  This EUA will remain in effect (meaning this test can be used) for the duration of the COVID-19 declaration under Section 564(b)(1) of the Act, 21 U.S.C. section 360bbb-3(b)(1), unless the authorization is terminated or revoked sooner.  Performed at [facility], [address] [place], [place] [address]   Respiratory (~20 pathogens) panel by PCR     Status: None   Collection Time: 07/20/22 11:48 PM   Specimen: Nasopharyngeal Swab; Respiratory  Result Value Ref Range Status   Adenovirus NOT DETECTED NOT DETECTED Final   Coronavirus 229E NOT DETECTED NOT DETECTED Final    Comment: (NOTE) The Coronavirus on the Respiratory Panel, DOES NOT test for the novel  Coronavirus (2019 nCoV)    Coronavirus HKU1 NOT DETECTED NOT DETECTED Final   Coronavirus NL63 NOT DETECTED NOT DETECTED Final   Coronavirus OC43 NOT DETECTED NOT DETECTED Final   Metapneumovirus NOT DETECTED NOT DETECTED Final   Rhinovirus / Enterovirus NOT DETECTED NOT DETECTED Final   Influenza A NOT DETECTED NOT DETECTED Final   Influenza B NOT DETECTED NOT DETECTED Final   Parainfluenza Virus 1 NOT DETECTED NOT DETECTED Final   Parainfluenza Virus 2 NOT DETECTED NOT DETECTED Final   Parainfluenza Virus 3 NOT DETECTED NOT DETECTED Final   Parainfluenza Virus 4 NOT DETECTED NOT DETECTED Final   Respiratory Syncytial Virus NOT DETECTED NOT DETECTED Final   Bordetella pertussis NOT DETECTED NOT DETECTED Final   Bordetella Parapertussis NOT DETECTED NOT DETECTED Final   Chlamydophila pneumoniae NOT DETECTED NOT DETECTED Final   Mycoplasma pneumoniae NOT DETECTED NOT DETECTED Final    Comment: Performed at [facility], [address] [place], [place] [address]  MRSA Next Gen by PCR, Nasal     Status: None   Collection Time: 07/20/22 11:48 PM   Specimen: Nasal Mucosa; Nasal Swab   Result Value Ref Range Status   MRSA by PCR Next Gen NOT DETECTED NOT DETECTED Final    Comment: (NOTE) The GeneXpert MRSA Assay (FDA approved for NASAL specimens only), is one component of a comprehensive MRSA colonization surveillance program. It is not intended to diagnose MRSA infection nor to guide or monitor treatment for MRSA infections. Test performance is not FDA approved in patients less than 51 years old. Performed at [facility], [address] [place], [place] [address]     Coagulation Studies: No results for input(s): "LABPROT", "INR" in the last 72 hours.  Urinalysis: No results for input(s): "COLORURINE", "LABSPEC", "PHURINE", "GLUCOSEU", "HGBUR", "BILIRUBINUR", "KETONESUR", "PROTEINUR", "UROBILINOGEN", "NITRITE", "LEUKOCYTESUR" in the last 72 hours.  Invalid input(s): "APPERANCEUR"    Imaging: DG Chest Port 1 View  Result Date: 07/23/2022 CLINICAL DATA:  10031 Cough 10031 EXAM: PORTABLE CHEST 1 VIEW COMPARISON:  CXR 07/20/22 FINDINGS: No pleural effusion. No pneumothorax. Interval increase in conspicuity of bilateral patchy airspace opacities. Borderline cardiomegaly. No radiographically apparent displaced rib  fractures. Visualized upper abdomen is notable for enteric contrast opacify splenic flexure. IMPRESSION: Interval increase in conspicuity of bilateral patchy airspace opacities, concerning for multifocal pneumonia. Electronically Signed   By: Lorenza Cambridge M.D.   On: 07/23/2022 10:38   NM Pulmonary Perfusion  Result Date: 07/23/2022 CLINICAL DATA:  Dyspnea, elevated D-dimer EXAM: NUCLEAR MEDICINE PERFUSION LUNG SCAN TECHNIQUE: Perfusion images were obtained in multiple projections after intravenous injection of radiopharmaceutical. Ventilation scans intentionally deferred if perfusion scan and chest x-ray adequate for interpretation during COVID 19 epidemic. RADIOPHARMACEUTICALS:  4.37 mCi Tc-36m MAA IV COMPARISON:  Same day chest radiograph FINDINGS:  Cardiomegaly. Homogeneous perfusion of the lungs. No suspicious perfusion defect. IMPRESSION: 1. Very low probability for pulmonary embolism by modified perfusion only PIOPED criteria (PE absent). 2.  Cardiomegaly. Electronically Signed   By: Jearld Lesch M.D.   On: 07/23/2022 10:36     Medications:    anticoagulant sodium citrate     cefTRIAXone (ROCEPHIN)  IV 2 g (07/22/22 1349)   promethazine (PHENERGAN) injection (IM or IVPB) 12.5 mg (07/22/22 2347)    allopurinol  100 mg Oral QHS   amLODipine  10 mg Oral Daily   benzonatate  200 mg Oral TID   carbidopa-levodopa  1 tablet Oral TID   carvedilol  25 mg Oral BID WC   Chlorhexidine Gluconate Cloth  6 each Topical Q0600   chlorpheniramine-HYDROcodone  5 mL Oral Q12H   DULoxetine  30 mg Oral Daily   ezetimibe  10 mg Oral Daily   gabapentin  300 mg Oral TID   guaiFENesin  600 mg Oral BID   heparin  5,000 Units Subcutaneous Q8H   hydrALAZINE  50 mg Oral Q8H   isosorbide mononitrate  60 mg Oral QPM   predniSONE  40 mg Oral Q breakfast   rOPINIRole  1 mg Oral QHS   senna  2 tablet Oral Daily   sodium chloride flush  3 mL Intravenous Q12H   vortioxetine HBr  10 mg Oral Daily   acetaminophen **OR** acetaminophen, ALPRAZolam, alteplase, anticoagulant sodium citrate, guaiFENesin, heparin, hydrALAZINE, hydrOXYzine, ipratropium-albuterol, lidocaine (PF), lidocaine-prilocaine, morphine injection, ondansetron **OR** ondansetron (ZOFRAN) IV, pentafluoroprop-tetrafluoroeth, promethazine (PHENERGAN) injection (IM or IVPB)  Assessment/ Plan:  [name] is a 47 y.o.  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   Hyperkalemia with End  Stage Renal Disease on hemodialysis:  - Potassium corrected .  - Continue MWF schedule.   Hypertension with chronic kidney disease:  - Blood pressure 184/94 - Receiving amlodipine, hydralazine, isosorbide, and metoprolol.    Anemia of chronic kidney disease: hemoglobin 8.7. Mircera as outpatient.   - Hgb slowly improving  - Will hold ESA due to hypotension.   Secondary Hyperparathyroidism:  - Will continue to monitor bone minerals. - Continue Lanthanum.    LOS: 2 Wendee Beavers 7/18/202412:43 PM

## 2022-07-23 NOTE — Evaluation (Addendum)
Physical Therapy Evaluation Patient Details Name: Marwa Fuhrman MRN: [id number] DOB: 1975-12-02 Today's Date: 07/23/2022  History of Present Illness  Pt is a 47 y/o F who presents with cough and SOB, also had conerns of chest pain and nausea, diagnosed with UTI and hypetensive emergency, being managed by medication. PMH significant for ESRD on dialysis, HTN, minimal change glomerulonephritis, DM, neuorpathy, CKD, facial twitch, and gout  Clinical Impression  Pt is in bed, A&Ox4, denies any pain. PTA pt was independent with mobility, required some help with ADL's on days where she feels weaker, lives with 2 daughters and a grandkid. Pt performed bed mobility c/ CGA, SPT EOB>chair c/ CGA. BP 161/112 in supine, 185/103 seated EOB, 183/149 seated EOB later, 197/107 directly after SPT in chair, 186/90 end of session supine in chair. Pt breath sounds labored, denies SOB, O2 sats >93 throughout session. Pt left in chair, needs within reach, alarm set, nursing notified in regard to elevated BP and present in room to assess pt. Pt would continue to benefit from skilled PT interventions to improve mobility, functional activity tolerance, and maximize independence.       Assistance Recommended at Discharge Frequent or constant Supervision/Assistance  If plan is discharge home, recommend the following:  Can travel by private vehicle  A lot of help with walking and/or transfers;A lot of help with bathing/dressing/bathroom;Assist for transportation;Assistance with cooking/housework;Help with stairs or ramp for entrance;Direct supervision/assist for medications management   No    Equipment Recommendations Other (comment) (TBD next venue of care)  Recommendations for Other Services       Functional Status Assessment Patient has had a recent decline in their functional status and demonstrates the ability to make significant improvements in function in a reasonable and predictable amount of  time.     Precautions / Restrictions Precautions Precautions: Fall Restrictions Weight Bearing Restrictions: No      Mobility  Bed Mobility Overal bed mobility: Needs Assistance Bed Mobility: Supine to Sit     Supine to sit: Min guard     General bed mobility comments: supine>sit CGA cueing for sequencing    Transfers Overall transfer level: Needs assistance Equipment used: None Transfers: Bed to chair/wheelchair/BSC   Stand pivot transfers: Min guard         General transfer comment: SPT EOB>chair CGA and cueing for scooting to EOB prior to transfer    Ambulation/Gait               General Gait Details: deferred  Stairs            Wheelchair Mobility     Tilt Bed    Modified Rankin (Stroke Patients Only)       Balance Overall balance assessment: Needs assistance Sitting-balance support: Feet supported, Bilateral upper extremity supported Sitting balance-Leahy Scale: Good Sitting balance - Comments: able to sit EOB ~3 mins c/o assistance   Standing balance support: No upper extremity supported Standing balance-Leahy Scale: Fair Standing balance comment: able to rise from EOB c/o UE support and SPT CGA                             Pertinent Vitals/Pain Pain Assessment Pain Assessment: No/denies pain    Home Living Family/patient expects to be discharged to:: Private residence Living Arrangements: Children Available Help at Discharge: Family Type of Home: House Home Access: Level entry     Alternate Level Stairs-Number of Steps: 14 Home Layout:  Two level Home Equipment: Grab bars - tub/shower      Prior Function Prior Level of Function : Needs assist       Physical Assist : ADLs (physical)   ADLs (physical): IADLs Mobility Comments: no AD at baseline ADLs Comments: pt reports occasionally needing assistance from daugthers for ADL's on her "bad days"     Hand Dominance        Extremity/Trunk Assessment    Upper Extremity Assessment Upper Extremity Assessment: Generalized weakness    Lower Extremity Assessment Lower Extremity Assessment: Generalized weakness       Communication   Communication: No difficulties  Cognition Arousal/Alertness: Awake/alert Behavior During Therapy: Flat affect Overall Cognitive Status: Within Functional Limits for tasks assessed                                 General Comments: A&Ox4, eager to participate, eyes are frequently close but pt is awake and responsive, reports d/t fatigue        General Comments      Exercises     Assessment/Plan    PT Assessment Patient needs continued PT services  PT Problem List Decreased strength;Decreased range of motion;Decreased knowledge of use of DME;Decreased activity tolerance;Decreased safety awareness;Decreased balance;Decreased mobility;Decreased coordination;Impaired sensation       PT Treatment Interventions DME instruction;Balance training;Gait training;Stair training;Neuromuscular re-education;Functional mobility training;Patient/family education;Therapeutic activities;Therapeutic exercise    PT Goals (Current goals can be found in the Care Plan section)  Acute Rehab PT Goals Patient Stated Goal: to return home PT Goal Formulation: With patient Time For Goal Achievement: 08/06/22 Potential to Achieve Goals: Good    Frequency Min 1X/week     Co-evaluation               AM-PAC PT "6 Clicks" Mobility  Outcome Measure Help needed turning from your back to your side while in a flat bed without using bedrails?: A Little Help needed moving from lying on your back to sitting on the side of a flat bed without using bedrails?: A Little Help needed moving to and from a bed to a chair (including a wheelchair)?: A Little Help needed standing up from a chair using your arms (e.g., wheelchair or bedside chair)?: A Little Help needed to walk in hospital room?: A Little Help needed  climbing 3-5 steps with a railing? : A Lot 6 Click Score: 17    End of Session Equipment Utilized During Treatment: Gait belt Activity Tolerance: Treatment limited secondary to medical complications (Comment)BP Patient left: in chair;with call bell/phone within reach;with chair alarm set Nurse Communication: Mobility status PT Visit Diagnosis: Unsteadiness on feet (R26.81);Muscle weakness (generalized) (M62.81);Other abnormalities of gait and mobility (R26.89)    Time: 1610-9604 PT Time Calculation (min) (ACUTE ONLY): 33 min   Charges:   PT Evaluation $PT Eval Low Complexity: 1 Low PT Treatments $Therapeutic Activity: 23-37 mins PT General Charges $$ ACUTE PT VISIT: 1 Visit         Lala Lund, PT, SPT  3:14 PM,07/23/22

## 2022-07-23 NOTE — Progress Notes (Signed)
PROGRESS NOTE    Patricia [name]  ZOX:[id number] DOB: [birth year] DOA: 07/20/2022 PCP: Rick Duff, PA-C    Brief Narrative:  47 y.o. female with medical history significant of ESRD on HD 2/2 [facility] on organ transplant list, hypertension, iron deficiency anemia, anemia of chronic kidney disease, HFpEF, who presents to the ED due to cough and shortness of breath.   Patricia [name] states that for the last couple days, she has been feeling feverish with chills.  Then yesterday, she noted that she felt generalized malaise with fatigue.  Then yesterday evening into the night, she developed shortness of breath with a nonproductive cough that has been progressively worsening.  She endorses chest pain only while coughing.  She endorses nausea but denies any vomiting, abdominal pain.  She notes that her feet were swollen yesterday, however swelling did not go any higher than her feet.  She states that her shortness of breath at this time feels different than her previous episodes of pulmonary edema.  She has not missed any dialysis sessions.  7/16: Patient underwent hemodialysis on 7/15 with 2 L fluid removal.  Remains with cough.  Blood pressure markedly elevated.   Assessment & Plan:   Principal Problem:   Acute respiratory distress Active Problems:   ESRD on dialysis (HCC)   Hypertension   Anemia due to chronic kidney disease   Idiopathic peripheral neuropathy   Type 2 diabetes mellitus with other diabetic kidney complication (HCC)   CAP (community acquired pneumonia)  * Acute respiratory distress Community-acquired pneumonia Patient is presenting with several day history of fever/chills, 1 day of malaise/fatigue and development of nonproductive cough and shortness of breath over the last 12 to 15 hours.  Chest x-ray with bilateral opacities concerning for pneumonia versus edema.   RVP negative Procalcitonin mildly elevated Vq scan negative Repeat cxr toway worsening patchy b/l  opacities concernin for pneumonia Plan: Continue Rocephin Continue azithromycin Repeat covid Add prednisone given wheezing May need CT vs tte, fluid overload? F/u bnp  Hypoxic respiratory failure Weaned off o2 at rest - PT eval  Hypertensive urgency Blood pressure remains elevated despite receiving home metoprolol and Imdur on 7/15.  She also received IV and p.o. hydralazine on 7/16. Bp still elevated Plan: Switch metop to coreg for better bp control Continue Imdur Added hydralazine 50 mg every 8 hours Added amlodipine 10 mg daily IV hydralazine as needed  ESRD on dialysis [facility]) Nephrology on consult [facility] dialysis continuing that schedule   Idiopathic peripheral neuropathy Continue home regimen of gabapentin and Requip  Abnormal facial movement Patient on Sinemet.  Compliance unclear.  Indication unclear.  Will need outpatient referral to neurology at time of discharge   Anemia due to chronic kidney disease Hemoglobin is stable at this time with no evidence of bleeding on examination  Morbid obesity BMI 39.09.  Meets criteria for morbid obesity given chronic comorbidities.  This complicates overall care and prognosis.   DVT prophylaxis: SQ heparin Code Status: FULL Family Communication:None Disposition Plan: Status is: inpt   Level of care: Telemetry Medical  Consultants:  Nephrology  Procedures:  None  Antimicrobials: Rocephin Azithromycin   Subjective: Seen and examined.  Coughing, short of breath. Improving but still with cough  Objective: Vitals:   07/23/22 0622 07/23/22 0827 07/23/22 0846 07/23/22 1142  BP: (!) 178/83 (!) 202/103 (!) 200/93 (!) 184/94  Pulse: 89 90  95  Resp:  20 20   Temp:  98 F (36.7 C)    TempSrc:  Oral    SpO2: 100% 97% 100%   Weight:      Height:        Intake/Output Summary (Last 24 hours) at 07/23/2022 1223 Last data filed at 07/23/2022 1051 Gross per 24 hour  Intake 600 ml  Output 1500 ml  Net -900 ml    Filed Weights   07/22/22 0804 07/22/22 1259 07/23/22 0500  Weight: 109.5 kg 108 kg 113.6 kg    Examination:  General exam: Appears uncomfortable Respiratory system: exp wheeze Cardiovascular system: S1-S2, RRR, no murmurs, no pedal edema Gastrointestinal system: Obese, soft, NT/ND, normal bowel sounds Central nervous system: Alert and oriented. No focal neurological deficits. Extremities: Symmetric 5 x 5 power. Skin: No rashes, lesions or ulcers Psychiatry: Judgement and insight appear normal. Mood & affect appropriate.     Data Reviewed: I have personally reviewed following labs and imaging studies  CBC: Recent Labs  Lab 07/20/22 1227 07/21/22 0413 07/22/22 0810 07/23/22 1050  WBC 6.8 8.0 7.3 7.0  HGB 8.1* 8.5* 7.6* 8.7*  HCT 26.3* 26.8* 23.9* 27.8*  MCV 86.5 83.0 83.0 85.0  PLT 258 281 237 217   Basic Metabolic Panel: Recent Labs  Lab 07/20/22 1227 07/21/22 0413 07/22/22 0810 07/23/22 1050  NA 141 134* 132* 131*  K 6.1* 4.5 5.3* 5.1  CL 102 92* 92* 93*  CO2 26 28 27 25   GLUCOSE 101* 105* 103* 100*  BUN 70* 37* 52* 35*  CREATININE 9.90* 6.18* 8.27* 6.57*  CALCIUM 9.1 8.9 8.7* 9.0  PHOS  --   --  6.4*  --    GFR: Estimated Creatinine Clearance: 13.6 mL/min (A) (by C-G formula based on SCr of 6.57 mg/dL (H)). Liver Function Tests: Recent Labs  Lab 07/20/22 1227 07/22/22 0810  AST 43*  --   ALT 9  --   ALKPHOS 80  --   BILITOT 0.4  --   PROT 7.3  --   ALBUMIN 3.5 3.5   No results for input(s): "LIPASE", "AMYLASE" in the last 168 hours. No results for input(s): "AMMONIA" in the last 168 hours. Coagulation Profile: No results for input(s): "INR", "PROTIME" in the last 168 hours. Cardiac Enzymes: No results for input(s): "CKTOTAL", "CKMB", "CKMBINDEX", "TROPONINI" in the last 168 hours. BNP (last 3 results) No results for input(s): "PROBNP" in the last 8760 hours. HbA1C: No results for input(s): "HGBA1C" in the last 72 hours. CBG: No results  for input(s): "GLUCAP" in the last 168 hours. Lipid Profile: No results for input(s): "CHOL", "HDL", "LDLCALC", "TRIG", "CHOLHDL", "LDLDIRECT" in the last 72 hours. Thyroid Function Tests: No results for input(s): "TSH", "T4TOTAL", "FREET4", "T3FREE", "THYROIDAB" in the last 72 hours. Anemia Panel: No results for input(s): "VITAMINB12", "FOLATE", "FERRITIN", "TIBC", "IRON", "RETICCTPCT" in the last 72 hours. Sepsis Labs: Recent Labs  Lab 07/20/22 1227  PROCALCITON 0.30    Recent Results (from the past 240 hour(s))  SARS Coronavirus 2 by RT PCR (hospital order, performed in [facility] hospital lab) *cepheid single result test* Anterior Nasal Swab     Status: None   Collection Time: 07/20/22 10:53 AM   Specimen: Anterior Nasal Swab  Result Value Ref Range Status   SARS Coronavirus 2 by RT PCR NEGATIVE NEGATIVE Final    Comment: (NOTE) SARS-CoV-2 target nucleic acids are NOT DETECTED.  The SARS-CoV-2 RNA is generally detectable in upper and lower respiratory specimens during the acute phase of infection. The lowest concentration of SARS-CoV-2 viral copies this assay can detect is 250 copies /  mL. A negative result does not preclude SARS-CoV-2 infection and should not be used as the sole basis for treatment or other patient management decisions.  A negative result may occur with improper specimen collection / handling, submission of specimen other than nasopharyngeal swab, presence of viral mutation(s) within the areas targeted by this assay, and inadequate number of viral copies (<250 copies / mL). A negative result must be combined with clinical observations, patient history, and epidemiological information.  Fact Sheet for Patients:   RoadLapTop.co.za  Fact Sheet for Healthcare Providers: http://kim-miller.com/  This test is not yet approved or  cleared by the [place] FDA and has been authorized for detection and/or diagnosis  of SARS-CoV-2 by FDA under an Emergency Use Authorization (EUA).  This EUA will remain in effect (meaning this test can be used) for the duration of the COVID-19 declaration under Section 564(b)(1) of the Act, 21 U.S.C. section 360bbb-3(b)(1), unless the authorization is terminated or revoked sooner.  Performed at [facility], [address] [place], [place] [address]   Respiratory (~20 pathogens) panel by PCR     Status: None   Collection Time: 07/20/22 11:48 PM   Specimen: Nasopharyngeal Swab; Respiratory  Result Value Ref Range Status   Adenovirus NOT DETECTED NOT DETECTED Final   Coronavirus 229E NOT DETECTED NOT DETECTED Final    Comment: (NOTE) The Coronavirus on the Respiratory Panel, DOES NOT test for the novel  Coronavirus (2019 nCoV)    Coronavirus HKU1 NOT DETECTED NOT DETECTED Final   Coronavirus NL63 NOT DETECTED NOT DETECTED Final   Coronavirus OC43 NOT DETECTED NOT DETECTED Final   Metapneumovirus NOT DETECTED NOT DETECTED Final   Rhinovirus / Enterovirus NOT DETECTED NOT DETECTED Final   Influenza A NOT DETECTED NOT DETECTED Final   Influenza B NOT DETECTED NOT DETECTED Final   Parainfluenza Virus 1 NOT DETECTED NOT DETECTED Final   Parainfluenza Virus 2 NOT DETECTED NOT DETECTED Final   Parainfluenza Virus 3 NOT DETECTED NOT DETECTED Final   Parainfluenza Virus 4 NOT DETECTED NOT DETECTED Final   Respiratory Syncytial Virus NOT DETECTED NOT DETECTED Final   Bordetella pertussis NOT DETECTED NOT DETECTED Final   Bordetella Parapertussis NOT DETECTED NOT DETECTED Final   Chlamydophila pneumoniae NOT DETECTED NOT DETECTED Final   Mycoplasma pneumoniae NOT DETECTED NOT DETECTED Final    Comment: Performed at [facility], [address], [place], [place] [address]  MRSA Next Gen by PCR, Nasal     Status: None   Collection Time: 07/20/22 11:48 PM   Specimen: Nasal Mucosa; Nasal Swab  Result Value Ref Range Status   MRSA by PCR Next Gen NOT  DETECTED NOT DETECTED Final    Comment: (NOTE) The GeneXpert MRSA Assay (FDA approved for NASAL specimens only), is one component of a comprehensive MRSA colonization surveillance program. It is not intended to diagnose MRSA infection nor to guide or monitor treatment for MRSA infections. Test performance is not FDA approved in patients less than 34 years old. Performed at [facility], [address] [place], [place] [address]          Radiology Studies: [facility] Chest [place] 1 View  Result Date: 07/23/2022 CLINICAL DATA:  10031 Cough 10031 EXAM: PORTABLE CHEST 1 VIEW COMPARISON:  CXR 07/20/22 FINDINGS: No pleural effusion. No pneumothorax. Interval increase in conspicuity of bilateral patchy airspace opacities. Borderline cardiomegaly. No radiographically apparent displaced rib fractures. Visualized upper abdomen is notable for enteric contrast opacify splenic flexure. IMPRESSION:  Interval increase in conspicuity of bilateral patchy airspace opacities, concerning for multifocal pneumonia. Electronically Signed   By: Lorenza Cambridge M.D.   On: 07/23/2022 10:38   NM Pulmonary Perfusion  Result Date: 07/23/2022 CLINICAL DATA:  Dyspnea, elevated D-dimer EXAM: NUCLEAR MEDICINE PERFUSION LUNG SCAN TECHNIQUE: Perfusion images were obtained in multiple projections after intravenous injection of radiopharmaceutical. Ventilation scans intentionally deferred if perfusion scan and chest x-ray adequate for interpretation during COVID 19 epidemic. RADIOPHARMACEUTICALS:  4.37 mCi Tc-69m MAA IV COMPARISON:  Same day chest radiograph FINDINGS: Cardiomegaly. Homogeneous perfusion of the lungs. No suspicious perfusion defect. IMPRESSION: 1. Very low probability for pulmonary embolism by modified perfusion only PIOPED criteria (PE absent). 2.  Cardiomegaly. Electronically Signed   By: Jearld Lesch M.D.   On: 07/23/2022 10:36        Scheduled Meds:  allopurinol  100 mg Oral QHS   amLODipine  10 mg Oral  Daily   benzonatate  200 mg Oral TID   carbidopa-levodopa  1 tablet Oral TID   Chlorhexidine Gluconate Cloth  6 each Topical Q0600   chlorpheniramine-HYDROcodone  5 mL Oral Q12H   DULoxetine  30 mg Oral Daily   ezetimibe  10 mg Oral Daily   gabapentin  300 mg Oral TID   guaiFENesin  600 mg Oral BID   heparin  5,000 Units Subcutaneous Q8H   hydrALAZINE  50 mg Oral Q8H   isosorbide mononitrate  60 mg Oral QPM   metoprolol succinate  50 mg Oral QPM   predniSONE  40 mg Oral Q breakfast   rOPINIRole  1 mg Oral QHS   senna  2 tablet Oral Daily   sodium chloride flush  3 mL Intravenous Q12H   vortioxetine HBr  10 mg Oral Daily   Continuous Infusions:  anticoagulant sodium citrate     cefTRIAXone (ROCEPHIN)  IV 2 g (07/22/22 1349)   promethazine (PHENERGAN) injection (IM or IVPB) 12.5 mg (07/22/22 2347)     LOS: 2 days   Silvano Bilis, MD [facility]   If 7PM-7AM, please contact night-coverage  07/23/2022, 12:23 PM

## 2022-07-23 NOTE — TOC Progression Note (Signed)
Transition of Care [facility]) - Progression Note    Patient Details  Name: Patricia [name] MRN: [id number] Date of Birth: 08/17/1975  [facility]) CM/SW Contact  Chapman Fitch, RN Phone Number: 07/23/2022, 4:11 PM  Clinical Narrative:     Therapy currently recommending SNF Patient states this is not something that she is interested in  Patient states that she would be agreeable to home health services. Patient states she does not have a preference of agency.  Referral made to [facility] with [facility] Furbish to review.  Patient confirms she has a rw in the home  Patient states daughter will provide transport at discharge     Expected Discharge Plan: Home/Self Care    Expected Discharge Plan and Services                                               Social Determinants of Health (SDOH) Interventions SDOH Screenings   Food Insecurity: No Food Insecurity (07/20/2022)  Housing: Low Risk  (07/20/2022)  Transportation Needs: No Transportation Needs (07/20/2022)  Utilities: Not At Risk (07/20/2022)  Financial Resource Strain: Low Risk  (09/04/2019)   Received from [facility], [facility]  Physical Activity: Insufficiently Active (09/04/2019)   Received from [facility], [facility]  Social Connections: Unknown (05/10/2021)   Received from [facility], [facility]  Stress: Stress Concern Present (09/04/2019)   Received from [facility], [facility]  Tobacco Use: Low Risk  (07/20/2022)  Health Literacy: Low Risk  (07/04/2019)   Received [facility], [facility]    Readmission Risk Interventions    07/23/2022    9:17 AM 05/13/2022    1:57 PM 05/12/2022    3:25 PM  Readmission Risk Prevention Plan  Transportation Screening Complete  Complete  Medication Review (RN Care Manager) Complete  Complete  PCP or Specialist appointment within 3-5 days of discharge  -- Complete  HRI or Home Care Consult  Not Complete   HRI or Home Care Consult  Pt Refusal Comments  NA   SW Recovery Care/Counseling Consult Complete  Complete  Palliative Care Screening Not Applicable  Not Applicable  Skilled Nursing Facility Not Applicable  Not Applicable

## 2022-07-23 NOTE — TOC Progression Note (Signed)
Transition of Care [facility]) - Progression Note    Patient Details  Name: Patricia [name] MRN: [id number] Date of Birth: 1975/06/07  Transition of Care [facility]) CM/SW Contact  Chapman Fitch, RN Phone Number: 07/23/2022, 9:18 AM  Clinical Narrative:      Admitted for: respiratory distress Admitted from: home PCP: Barth Kirks - drives her self to appointments Pharmacy: Current home health/prior home health/DME: RW  Patient has been weaned to RA PT eval pending       Expected Discharge Plan: Home/Self Care    Expected Discharge Plan and Services                                               Social Determinants of Health (SDOH) Interventions SDOH Screenings   Food Insecurity: No Food Insecurity (07/20/2022)  Housing: Low Risk  (07/20/2022)  Transportation Needs: No Transportation Needs (07/20/2022)  Utilities: Not At Risk (07/20/2022)  Financial Resource Strain: Low Risk  (09/04/2019)   Received from [facility], [facility]  Physical Activity: Insufficiently Active (09/04/2019)   Received from [facility], [facility]  Social Connections: Unknown (05/10/2021)   [facility], [facility]  Stress: Stress Concern Present (09/04/2019)   Received from [facility], [facility]  Tobacco Use: Low Risk  (07/20/2022)  Health Literacy: Low Risk  (07/04/2019)   Received from [facility], [facility]    Readmission Risk Interventions    07/23/2022    9:17 AM 05/13/2022    1:57 PM 05/12/2022    3:25 PM  Readmission Risk Prevention Plan  Transportation Screening Complete  Complete  Medication Review (RN Care Manager) Complete  Complete  PCP or Specialist appointment within 3-5 days of discharge  -- Complete  HRI or Home Care Consult  Not Complete   HRI or Home Care Consult Pt Refusal Comments  NA   SW Recovery Care/Counseling Consult Complete  Complete  Palliative Care Screening Not Applicable  Not Applicable  Skilled Nursing  Facility Not Applicable  Not Applicable

## 2022-07-23 NOTE — Care Management Important Message (Signed)
Important Message  Patient Details  Name: Patricia [name] MRN: [id number] Date of Birth: 07-29-1975   Medicare Important Message Given:  N/A - LOS <3 / Initial given by admissions     Johnell Comings 07/23/2022, 2:36 PM

## 2022-07-24 ENCOUNTER — Other Ambulatory Visit: Payer: Self-pay | Admitting: Neurology

## 2022-07-24 LAB — BASIC METABOLIC PANEL
Anion gap: 16 — ABNORMAL HIGH (ref 5–15)
BUN: 53 mg/dL — ABNORMAL HIGH (ref 6–20)
CO2: 24 mmol/L (ref 22–32)
Calcium: 9 mg/dL (ref 8.9–10.3)
Chloride: 93 mmol/L — ABNORMAL LOW (ref 98–111)
Creatinine, Ser: 7.93 mg/dL — ABNORMAL HIGH (ref 0.44–1.00)
GFR, Estimated: 6 mL/min — ABNORMAL LOW (ref >60)
Glucose, Bld: 129 mg/dL — ABNORMAL HIGH (ref 70–99)
Potassium: 5.5 mmol/L — ABNORMAL HIGH (ref 3.5–5.1)
Sodium: 133 mmol/L — ABNORMAL LOW (ref 135–145)

## 2022-07-24 LAB — CBC
HCT: 24.7 % — ABNORMAL LOW (ref 36.0–46.0)
Hemoglobin: 8.1 g/dL — ABNORMAL LOW (ref 12.0–15.0)
MCH: 26.6 pg (ref 26.0–34.0)
MCHC: 32.8 g/dL (ref 30.0–36.0)
MCV: 81 fL (ref 80.0–100.0)
Platelets: 257 10*3/uL (ref 150–400)
RBC: 3.05 MIL/uL — ABNORMAL LOW (ref 3.87–5.11)
RDW: 16.4 % — ABNORMAL HIGH (ref 11.5–15.5)
WBC: 5.4 10*3/uL (ref 4.0–10.5)
nRBC: 1.7 % — ABNORMAL HIGH (ref 0.0–0.2)

## 2022-07-24 LAB — ECHOCARDIOGRAM COMPLETE
AR max vel: 2.91 cm2
AV Peak grad: 9.4 mmHg
Ao pk vel: 1.53 m/s
Area-P 1/2: 4.06 cm2
Height: 67 in
S' Lateral: 3.1 cm
Weight: 4007.08 oz

## 2022-07-24 LAB — PHOSPHORUS: Phosphorus: 5.9 mg/dL — ABNORMAL HIGH (ref 2.5–4.6)

## 2022-07-24 MED ORDER — AMOXICILLIN-POT CLAVULANATE 500-125 MG PO TABS: 1.0000 | ORAL_TABLET | Freq: Three times a day (TID) | ORAL | 0 refills | Status: DC

## 2022-07-24 MED ORDER — CARVEDILOL 25 MG PO TABS: 25.0000 mg | ORAL_TABLET | Freq: Two times a day (BID) | ORAL | 1 refills | Status: AC

## 2022-07-24 MED ORDER — BENZONATATE 100 MG PO CAPS: 100.0000 mg | ORAL_CAPSULE | Freq: Four times a day (QID) | ORAL | 0 refills | Status: DC | PRN

## 2022-07-24 MED ORDER — HYDRALAZINE HCL 50 MG PO TABS: 50.0000 mg | ORAL_TABLET | Freq: Three times a day (TID) | ORAL | 1 refills | Status: DC

## 2022-07-24 MED ORDER — AMLODIPINE BESYLATE 10 MG PO TABS: 10.0000 mg | ORAL_TABLET | Freq: Every day | ORAL | 1 refills | Status: DC

## 2022-07-24 NOTE — Progress Notes (Signed)
  Received patient in bed to unit.   Informed consent signed and in chart.    TX duration: 3.5 hrs     Hand-off given to patient's nurse: Milta Deiters   Access used: RUA Fistula AVF Access issues: High venous pressure   Total UF removed:  1.5 L Medication(s) given: N/A Post HD VS: 167/90 BP 95 HR, 24 RR, 99 sPO2, 98.5 T Post HD weight: 107.1 kg     Marland Mcalpine  [facility] D[facility] Unit

## 2022-07-24 NOTE — Progress Notes (Signed)
OT Cancellation Note  Patient Details Name: Patricia [name] MRN: [id number] DOB: [birth year]   Cancelled Treatment:    Reason Eval/Treat Not Completed: Patient at procedure or test/ unavailable. Consult received, chart reviewed. Pt out of the room upon initial attempt. Scheduled for HD. Will re-attempt OT evaluation at later time as pt is available.   Arman Filter., MPH, MS, OTR/L ascom [phone] 07/24/22, 9:00 AM

## 2022-07-24 NOTE — TOC Progression Note (Signed)
Transition of Care [facility]) - Progression Note    Patient Details  Name: Patricia [name] MRN: [id number] Date of Birth: 02/11/1975  Transition of [facility] CM/SW Contact  Margarito Liner, LCSW Phone Number: 07/24/2022, 8:29 AM  Clinical Narrative: [facility] confirmed they can accept patient's home health therapy referral.    Expected Discharge Plan: Home/Self Care    Expected Discharge Plan and Services                                               Social Determinants of Health (SDOH) Interventions SDOH Screenings   Food Insecurity: No Food Insecurity (07/20/2022)  Housing: Low Risk  (07/20/2022)  Transportation Needs: No Transportation Needs (07/20/2022)  Utilities: Not At Risk (07/20/2022)  Financial Resource Strain: Low Risk  (09/04/2019)   Received from [facility], [facility]  Physical Activity: Insufficiently Active (09/04/2019)   Received from [facility], [facility]  Social Connections: Unknown (05/10/2021)   Received from [facility], [facility]  Stress: Stress Concern Present (09/04/2019)   Received from [facility], [facility]  Tobacco Use: Low Risk  (07/20/2022)  Health Literacy: Low Risk  (07/04/2019)   Received from [facility], [facility]    Readmission Risk Interventions    07/23/2022    9:17 AM 05/13/2022    1:57 PM 05/12/2022    3:25 PM  Readmission Risk Prevention Plan  Transportation Screening Complete  Complete  Medication Review (RN Care Manager) Complete  Complete  PCP or Specialist appointment within 3-5 days of discharge  -- Complete  HRI or Home Care Consult  Not Complete   HRI or Home Care Consult Pt Refusal Comments  NA   SW Recovery Care/Counseling Consult Complete  Complete  Palliative Care Screening Not Applicable  Not Applicable  Skilled Nursing Facility Not Applicable  Not Applicable

## 2022-07-24 NOTE — Progress Notes (Signed)
[facility]  ROUNDING NOTE   Subjective:   [name] was admitted to [facility] on 07/20/2022 for Acute respiratory distress [R06.03] Acute pulmonary edema (HCC) [J81.0] CAP (community acquired pneumonia) [J18.9]  Patient seen and evaluated it during dialysis   HEMODIALYSIS FLOWSHEET:  Blood Flow Rate (mL/min): 300 mL/min Arterial Pressure (mmHg): -30 mmHg Venous Pressure (mmHg): 250 mmHg TMP (mmHg): 25 mmHg Ultrafiltration Rate (mL/min): 715 mL/min Dialysate Flow Rate (mL/min): 300 ml/min Dialysis Fluid Bolus: Normal Saline  Resting well during treatment Will increase UF goal  Objective:  Vital signs in last 24 hours:  Temp:  [97.7 F (36.5 C)-98.8 F (37.1 C)] 98.4 F (36.9 C) (07/19 0829) Pulse Rate:  [83-96] 94 (07/19 1130) Resp:  [18-26] 24 (07/19 1130) BP: (139-196)/(62-110) 175/83 (07/19 1130) SpO2:  [93 %-100 %] 96 % (07/19 1130) Weight:  [109 kg-110.1 kg] 109 kg (07/19 0829)  Weight change: 0.6 kg Filed Weights   07/23/22 0500 07/24/22 0500 07/24/22 0829  Weight: 113.6 kg 110.1 kg 109 kg    Intake/Output: I/O last 3 completed shifts: In: 800 [P.O.:800] Out: -    Intake/Output this shift:  No intake/output data recorded.  Physical Exam: General: NAD, laying in bed  Head: Normocephalic, atraumatic. Moist oral mucosal membranes  Eyes: Anicteric  Lungs:   Normal effort  Heart: Regular rate and rhythm  Abdomen:  Soft, nontender  Extremities:  no peripheral edema.  Neurologic: Alert and oriented, moving all four extremities  Skin: No lesions  Access: Left AVF    Basic Metabolic Panel: Recent Labs  Lab 07/20/22 1227 07/21/22 0413 07/22/22 0810 07/23/22 1050 07/24/22 0834  NA 141 134* 132* 131* 133*  K 6.1* 4.5 5.3* 5.1 5.5*  CL 102 92* 92* 93* 93*  CO2 26 28 27 25 24   GLUCOSE 101* 105* 103* 100* 129*  BUN 70* 37* 52* 35* 53*  CREATININE 9.90* 6.18* 8.27* 6.57* 7.93*  CALCIUM 9.1 8.9 8.7* 9.0 9.0  PHOS  --   --   6.4*  --   --     Liver Function Tests: Recent Labs  Lab 07/20/22 1227 07/22/22 0810  AST 43*  --   ALT 9  --   ALKPHOS 80  --   BILITOT 0.4  --   PROT 7.3  --   ALBUMIN 3.5 3.5   No results for input(s): "LIPASE", "AMYLASE" in the last 168 hours. No results for input(s): "AMMONIA" in the last 168 hours.  CBC: Recent Labs  Lab 07/20/22 1227 07/21/22 0413 07/22/22 0810 07/23/22 1050 07/24/22 0834  WBC 6.8 8.0 7.3 7.0 5.4  HGB 8.1* 8.5* 7.6* 8.7* 8.1*  HCT 26.3* 26.8* 23.9* 27.8* 24.7*  MCV 86.5 83.0 83.0 85.0 81.0  PLT 258 281 237 217 257    Cardiac Enzymes: No results for input(s): "CKTOTAL", "CKMB", "CKMBINDEX", "TROPONINI" in the last 168 hours.  BNP: Invalid input(s): "POCBNP"  CBG: No results for input(s): "GLUCAP" in the last 168 hours.  Microbiology: Results for orders placed or performed during the hospital encounter of 07/20/22  SARS Coronavirus 2 by RT PCR (hospital order, performed in [facility] hospital lab) *cepheid single result test* Anterior Nasal Swab     Status: None   Collection Time: 07/20/22 10:53 AM   Specimen: Anterior Nasal Swab  Result Value Ref Range Status   SARS Coronavirus 2 by RT PCR NEGATIVE NEGATIVE Final    Comment: (NOTE) SARS-CoV-2 target nucleic acids are NOT DETECTED.  The SARS-CoV-2 RNA is  generally detectable in upper and lower respiratory specimens during the acute phase of infection. The lowest concentration of SARS-CoV-2 viral copies this assay can detect is 250 copies / mL. A negative result does not preclude SARS-CoV-2 infection and should not be used as the sole basis for treatment or other patient management decisions.  A negative result may occur with improper specimen collection / handling, submission of specimen other than nasopharyngeal swab, presence of viral mutation(s) within the areas targeted by this assay, and inadequate number of viral copies (<250 copies / mL). A negative result must be combined  with clinical observations, patient history, and epidemiological information.  Fact Sheet for Patients:   RoadLapTop.co.za  Fact Sheet for Healthcare Providers: http://kim-miller.com/  This test is not yet approved or  cleared by the [place] FDA and has been authorized for detection and/or diagnosis of SARS-CoV-2 by FDA under an Emergency Use Authorization (EUA).  This EUA will remain in effect (meaning this test can be used) for the duration of the COVID-19 declaration under Section 564(b)(1) of the Act, 21 U.S.C. section 360bbb-3(b)(1), unless the authorization is terminated or revoked sooner.  Performed at [facility], [address] [place], [place] [address]   Respiratory (~20 pathogens) panel by PCR     Status: None   Collection Time: 07/20/22 11:48 PM   Specimen: Nasopharyngeal Swab; Respiratory  Result Value Ref Range Status   Adenovirus NOT DETECTED NOT DETECTED Final   Coronavirus 229E NOT DETECTED NOT DETECTED Final    Comment: (NOTE) The Coronavirus on the Respiratory Panel, DOES NOT test for the novel  Coronavirus (2019 nCoV)    Coronavirus HKU1 NOT DETECTED NOT DETECTED Final   Coronavirus NL63 NOT DETECTED NOT DETECTED Final   Coronavirus OC43 NOT DETECTED NOT DETECTED Final   Metapneumovirus NOT DETECTED NOT DETECTED Final   Rhinovirus / Enterovirus NOT DETECTED NOT DETECTED Final   Influenza A NOT DETECTED NOT DETECTED Final   Influenza B NOT DETECTED NOT DETECTED Final   Parainfluenza Virus 1 NOT DETECTED NOT DETECTED Final   Parainfluenza Virus 2 NOT DETECTED NOT DETECTED Final   Parainfluenza Virus 3 NOT DETECTED NOT DETECTED Final   Parainfluenza Virus 4 NOT DETECTED NOT DETECTED Final   Respiratory Syncytial Virus NOT DETECTED NOT DETECTED Final   Bordetella pertussis NOT DETECTED NOT DETECTED Final   Bordetella Parapertussis NOT DETECTED NOT DETECTED Final   Chlamydophila pneumoniae NOT  DETECTED NOT DETECTED Final   Mycoplasma pneumoniae NOT DETECTED NOT DETECTED Final    Comment: Performed at [facility], [address], [place], [place] [address]  MRSA Next Gen by PCR, Nasal     Status: None   Collection Time: 07/20/22 11:48 PM   Specimen: Nasal Mucosa; Nasal Swab  Result Value Ref Range Status   MRSA by PCR Next Gen NOT DETECTED NOT DETECTED Final    Comment: (NOTE) The GeneXpert MRSA Assay (FDA approved for NASAL specimens only), is one component of a comprehensive MRSA colonization surveillance program. It is not intended to diagnose MRSA infection nor to guide or monitor treatment for MRSA infections. Test performance is not FDA approved in patients less than 90 years old. Performed at [facility], [address] [place], [place] [address]   SARS Coronavirus 2 by RT PCR (hospital order, performed in [facility] hospital lab) *cepheid single result test* Anterior Nasal Swab     Status: None   Collection Time: 07/23/22  1:30 PM   Specimen: Anterior  Nasal Swab  Result Value Ref Range Status   SARS Coronavirus 2 by RT PCR NEGATIVE NEGATIVE Final    Comment: (NOTE) SARS-CoV-2 target nucleic acids are NOT DETECTED.  The SARS-CoV-2 RNA is generally detectable in upper and lower respiratory specimens during the acute phase of infection. The lowest concentration of SARS-CoV-2 viral copies this assay can detect is 250 copies / mL. A negative result does not preclude SARS-CoV-2 infection and should not be used as the sole basis for treatment or other patient management decisions.  A negative result may occur with improper specimen collection / handling, submission of specimen other than nasopharyngeal swab, presence of viral mutation(s) within the areas targeted by this assay, and inadequate number of viral copies (<250 copies / mL). A negative result must be combined with clinical observations, patient history, and epidemiological  information.  Fact Sheet for Patients:   RoadLapTop.co.za  Fact Sheet for Healthcare Providers: http://kim-miller.com/  This test is not yet approved or  cleared by the [place] FDA and has been authorized for detection and/or diagnosis of SARS-CoV-2 by FDA under an Emergency Use Authorization (EUA).  This EUA will remain in effect (meaning this test can be used) for the duration of the COVID-19 declaration under Section 564(b)(1) of the Act, 21 U.S.C. section 360bbb-3(b)(1), unless the authorization is terminated or revoked sooner.  Performed at [facility], [address] [place], [place] [address]     Coagulation Studies: No results for input(s): "LABPROT", "INR" in the last 72 hours.  Urinalysis: No results for input(s): "COLORURINE", "LABSPEC", "PHURINE", "GLUCOSEU", "HGBUR", "BILIRUBINUR", "KETONESUR", "PROTEINUR", "UROBILINOGEN", "NITRITE", "LEUKOCYTESUR" in the last 72 hours.  Invalid input(s): "APPERANCEUR"    Imaging: CT CHEST WO CONTRAST  Result Date: 07/23/2022 CLINICAL DATA:  Shortness of breath EXAM: CT CHEST WITHOUT CONTRAST TECHNIQUE: Multidetector CT imaging of the chest was performed following the standard protocol without IV contrast. RADIATION DOSE REDUCTION: This exam was performed according to the departmental dose-optimization program which includes automated exposure control, adjustment of the mA and/or kV according to patient size and/or use of iterative reconstruction technique. COMPARISON:  None Available. FINDINGS: Cardiovascular: Cardiomegaly. Trace pericardial effusion. Normal caliber thoracic aorta with mild atherosclerotic disease. Severe coronary artery calcifications. Mediastinum/Nodes: Small hiatal hernia. Thyroid is unremarkable. Mildly enlarged mediastinal and hilar lymph nodes, likely reactive. Lungs/Pleura: Central airways are patent. Bilateral central predominant patchy ground-glass  opacities with associated interlobular septal thickening. Small right-greater-than-left pleural effusions. Upper Abdomen: No acute abnormality. Musculoskeletal: No chest wall mass or suspicious bone lesions identified. IMPRESSION: 1. Bilateral central predominant patchy ground-glass opacities with associated interlobular septal thickening, consistent with pulmonary edema. 2. Small right-greater-than-left pleural effusions. 3. Cardiomegaly. 4. Coronary artery calcifications and aortic Atherosclerosis (ICD10-I70.0). Electronically Signed   By: Allegra Lai M.D.   On: 07/23/2022 16:12   DG Chest Port 1 View  Result Date: 07/23/2022 CLINICAL DATA:  10031 Cough 10031 EXAM: PORTABLE CHEST 1 VIEW COMPARISON:  CXR 07/20/22 FINDINGS: No pleural effusion. No pneumothorax. Interval increase in conspicuity of bilateral patchy airspace opacities. Borderline cardiomegaly. No radiographically apparent displaced rib fractures. Visualized upper abdomen is notable for enteric contrast opacify splenic flexure. IMPRESSION: Interval increase in conspicuity of bilateral patchy airspace opacities, concerning for multifocal pneumonia. Electronically Signed   By: Lorenza Cambridge M.D.   On: 07/23/2022 10:38   NM Pulmonary Perfusion  Result Date: 07/23/2022 CLINICAL DATA:  Dyspnea, elevated D-dimer EXAM: NUCLEAR MEDICINE PERFUSION LUNG SCAN TECHNIQUE: Perfusion images were obtained in multiple projections after intravenous injection  of radiopharmaceutical. Ventilation scans intentionally deferred if perfusion scan and chest x-ray adequate for interpretation during COVID 19 epidemic. RADIOPHARMACEUTICALS:  4.37 mCi Tc-66m MAA IV COMPARISON:  Same day chest radiograph FINDINGS: Cardiomegaly. Homogeneous perfusion of the lungs. No suspicious perfusion defect. IMPRESSION: 1. Very low probability for pulmonary embolism by modified perfusion only PIOPED criteria (PE absent). 2.  Cardiomegaly. Electronically Signed   By: Jearld Lesch M.D.    On: 07/23/2022 10:36     Medications:    anticoagulant sodium citrate     cefTRIAXone (ROCEPHIN)  IV 2 g (07/23/22 1318)   promethazine (PHENERGAN) injection (IM or IVPB) 12.5 mg (07/22/22 2347)    allopurinol  100 mg Oral QHS   amLODipine  10 mg Oral Daily   benzonatate  200 mg Oral TID   carbidopa-levodopa  1 tablet Oral TID   carvedilol  25 mg Oral BID WC   Chlorhexidine Gluconate Cloth  6 each Topical Q0600   chlorpheniramine-HYDROcodone  5 mL Oral Q12H   DULoxetine  30 mg Oral Daily   ezetimibe  10 mg Oral Daily   gabapentin  300 mg Oral TID   guaiFENesin  600 mg Oral BID   heparin  5,000 Units Subcutaneous Q8H   hydrALAZINE  50 mg Oral Q8H   isosorbide mononitrate  60 mg Oral QPM   predniSONE  40 mg Oral Q breakfast   rOPINIRole  1 mg Oral QHS   senna  2 tablet Oral Daily   sodium chloride flush  3 mL Intravenous Q12H   vortioxetine HBr  10 mg Oral Daily   acetaminophen **OR** acetaminophen, ALPRAZolam, alteplase, anticoagulant sodium citrate, guaiFENesin, heparin, hydrALAZINE, hydrOXYzine, ipratropium-albuterol, lidocaine (PF), lidocaine-prilocaine, morphine injection, ondansetron **OR** ondansetron (ZOFRAN) IV, pentafluoroprop-tetrafluoroeth, promethazine (PHENERGAN) injection (IM or IVPB)  Assessment/ Plan:  [name] is a 47 y.o.  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   Hyperkalemia with End Stage Renal Disease on hemodialysis:  - Potassium corrected .  - Increased UF to 2.5-3L as tolerated - Continue MWF schedule.   Hypertension with chronic kidney disease:  - Blood pressure 149/82 during dialysis - Receiving amlodipine, hydralazine,  isosorbide, and metoprolol.    Anemia of chronic kidney disease: hemoglobin 8.1. Mircera as outpatient.   - Hgb decreased today.  - Will hold ESA due to hypotension.   Secondary Hyperparathyroidism:  - Calcium stable and phosphorus improving - Continue Lanthanum.    LOS: 3   7/19/202411:41 AM

## 2022-07-24 NOTE — Discharge Summary (Signed)
Sherryl Valido ZOX:[id number] DOB: [birth year] DOA: 07/20/2022  PCP: Rick Duff, PA-C  Admit date: 07/20/2022 Discharge date: 07/24/2022  Time spent: 35 minutes  Recommendations for Outpatient Follow-up:  Pcp f/u, nephrology f/u     Discharge Diagnoses:  Principal Problem:   Acute respiratory distress Active Problems:   ESRD on dialysis [facility])   Hypertension   Anemia due to chronic kidney disease   Idiopathic peripheral neuropathy   Type 2 diabetes mellitus with other diabetic kidney complication (HCC)   CAP (community acquired pneumonia)   Discharge Condition: improved  Diet recommendation: low sodium fluid restrict 1 liter  Filed Weights   07/24/22 0500 07/24/22 0829 07/24/22 1235  Weight: 110.1 kg 109 kg 107.1 kg    History of present illness:  From admission h and p Horace Josiephine Simao is a 47 y.o. female with medical history significant of ESRD on HD 2/2 [facility] on organ transplant list, hypertension, iron deficiency anemia, anemia of chronic kidney disease, HFpEF, who presents to the ED due to cough and shortness of breath.   Ms. Eisler states that for the last couple days, she has been feeling feverish with chills.  Then yesterday, she noted that she felt generalized malaise with fatigue.  Then yesterday evening into the night, she developed shortness of breath with a nonproductive cough that has been progressively worsening.  She endorses chest pain only while coughing.  She endorses nausea but denies any vomiting, abdominal pain.  She notes that her feet were swollen yesterday, however swelling did not go any higher than her feet.  She states that her shortness of breath at this time feels different than her previous episodes of pulmonary edema.  She has not missed any dialysis sessions.  Hospital Course:  Patient presented with acute respiratory distress. She reported fever and chills and there were b/l opacities on cxr so patient was started on abx  (ceftriaxone/azithromycin). Failed to make much improvement on that, VQ neg for PE, CT performed showing pulmonary edema. Patient doesn't make urine. Dialysis performed with ultrafiltration and this essentially resolved the patient's respiratory distress. Also with uncontrolled hypertension, patient says multiple home bp meds recently stopped by nephrology, volume overload also contributing. Several agents were started here. Discharged home to resume mwf hemodialysis, will need attention to volume and bp at nephrology f/u. Will finish a 5 day course of abx for possible CAP. Also reviewed low sodium diet, 1 liter fluid restriction.   Procedures: hemodialysis   Consultations: nephrology  Discharge Exam: Vitals:   07/24/22 1223 07/24/22 1230  BP: (!) 175/92 (!) 167/90  Pulse: 95 97  Resp: (!) 26 (!) 24  Temp:  98.5 F (36.9 C)  SpO2: 96% 99%    General: NAD Cardiovascular: RRR Respiratory: CTAB  Discharge Instructions   Discharge Instructions     Diet - low sodium heart healthy   Complete by: As directed    Face-to-face encounter (required for Medicare/Medicaid patients)   Complete by: As directed    I Silvano Bilis certify that this patient is under my care and that I, or a nurse practitioner or physician's assistant working with me, had a face-to-face encounter that meets the physician face-to-face encounter requirements with this patient on 07/24/2022. The encounter with the patient was in whole, or in part for the following medical condition(s) which is the primary reason for home health care (List medical condition): esrd   The encounter with the patient was in whole, or in part, for the following  medical condition, which is the primary reason for home health care: esrd   I certify that, based on my findings, the following services are medically necessary home health services: Physical therapy   Reason for Medically Necessary Home Health Services: Therapy- Therapeutic Exercises to  Increase Strength and Endurance   My clinical findings support the need for the above services: Shortness of breath with activity   Further, I certify that my clinical findings support that this patient is homebound due to: Shortness of Breath with activity   Home Health   Complete by: As directed    To provide the following care/treatments: PT   Increase activity slowly   Complete by: As directed    No wound care   Complete by: As directed       Allergies as of 07/24/2022       Reactions   Nsaids Other (See Comments)   Cannot take due to Kidney disease/Kidney function Other Reaction(s): Unknown   Tolmetin Other (See Comments)   Cannot take due to Kidney Disease        Medication List     STOP taking these medications    metoprolol succinate 50 MG 24 hr tablet Commonly known as: TOPROL-XL       TAKE these medications    acetaminophen 500 MG tablet Commonly known as: TYLENOL Take 1,000 mg by mouth every 6 (six) hours as needed for moderate pain.   allopurinol 100 MG tablet Commonly known as: ZYLOPRIM Take 100 mg by mouth at bedtime.   ALPRAZolam 1 MG tablet Commonly known as: XANAX Take 1 mg by mouth 2 (two) times daily as needed for anxiety or sleep.   amLODipine 10 MG tablet Commonly known as: NORVASC Take 1 tablet (10 mg total) by mouth daily. Start taking on: July 25, 2022   amoxicillin-clavulanate 500-125 MG tablet Commonly known as: Augmentin Take 1 tablet by mouth 3 (three) times daily.   carbidopa-levodopa 25-100 MG tablet Commonly known as: SINEMET IR TAKE 1 TABLET BY MOUTH THREE TIMES DAILY   carvedilol 25 MG tablet Commonly known as: COREG Take 1 tablet (25 mg total) by mouth 2 (two) times daily with a meal.   Diclofenac Sodium 1 % Crea 1 gram qid prn   DULoxetine 30 MG capsule Commonly known as: Cymbalta Take 1 capsule (30 mg total) by mouth daily.   ezetimibe 10 MG tablet Commonly known as: ZETIA Take 10 mg by mouth daily.    gabapentin 100 MG capsule Commonly known as: NEURONTIN TAKE 3 CAPSULES(300 MG) BY MOUTH THREE TIMES DAILY   hydrALAZINE 50 MG tablet Commonly known as: APRESOLINE Take 1 tablet (50 mg total) by mouth every 8 (eight) hours.   hydrocortisone 2.5 % rectal cream Commonly known as: Anusol-HC Place 1 Application rectally 2 (two) times daily.   hydrOXYzine 25 MG tablet Commonly known as: ATARAX Take 25 mg by mouth every 6 (six) hours as needed for itching.   isosorbide mononitrate 60 MG 24 hr tablet Commonly known as: IMDUR Take 1 tablet (60 mg total) by mouth every evening. Skip the dose if systolic BP less than 140 mmHg   lanthanum 1000 MG chewable tablet Commonly known as: FOSRENOL Chew 1,000-3,000 mg by mouth See admin instructions. Take 3000 mg with each meal and 1000-2000 mg with each snack   lidocaine-prilocaine cream Commonly known as: EMLA 1gram tid prn   multivitamin Tabs tablet Take 1 tablet by mouth at bedtime.   omeprazole 40 MG capsule Commonly known  as: PRILOSEC Take 40 mg by mouth in the morning and at bedtime.   ondansetron 4 MG tablet Commonly known as: ZOFRAN Take 4 mg by mouth 2 (two) times daily as needed.   polyethylene glycol 17 g packet Commonly known as: MiraLax Take 17 g by mouth daily.   promethazine 25 MG tablet Commonly known as: PHENERGAN Take 25 mg by mouth 3 (three) times daily as needed.   rOPINIRole 1 MG tablet Commonly known as: REQUIP Take 1 tablet (1 mg total) by mouth at bedtime.   vortioxetine HBr 10 MG Tabs tablet Commonly known as: TRINTELLIX Take 10 mg by mouth daily.   Xeomin 50 units Solr injection Generic drug: incobotulinumtoxinA Inject 50 Units into the muscle every 3 (three) months.   zolpidem 5 MG tablet Commonly known as: AMBIEN Take 5 mg by mouth at bedtime.       Allergies  Allergen Reactions   Nsaids Other (See Comments)    Cannot take due to Kidney disease/Kidney function  Other Reaction(s):  Unknown   Tolmetin Other (See Comments)    Cannot take due to Kidney Disease    Follow-up Information     [facility] Follow up.   Specialty: Home Health Services Why: They will follow up with you for your home health therapy needs. Contact information: [address] [place] [address] [phone]         Rick Duff, PA-C Follow up.   Specialty: Physician Assistant Contact information: [address] [place] [address] [phone]                  The results of significant diagnostics from this hospitalization (including imaging, microbiology, ancillary and laboratory) are listed below for reference.    Significant Diagnostic Studies: ECHOCARDIOGRAM COMPLETE  Result Date: 07/24/2022    ECHOCARDIOGRAM REPORT   Patient Name:   Dyneisha MARSHELLE Derousse Date of Exam: 07/23/2022 Medical Rec #:  [id number]                 Height:       67.0 in Accession #:    [id number]                Weight:       250.4 lb Date of Birth:  1975/04/24                 BSA:          2.225 m Patient Age:    46 years                  BP:           157/81 mmHg Patient Gender: F                         HR:           84 bpm. Exam Location:  [facility] Procedure: 2D Echo Indications:     Dyspnea R06.00  History:         Patient has prior history of Echocardiogram examinations.  Sonographer:     Elwin Sleight RDCS Referring Phys:  [id number] [facility] QQVZ Diagnosing Phys: Lorine Bears MD IMPRESSIONS  1. Left ventricular ejection fraction, by estimation, is 60 to 65%. The left ventricle has normal function. The left ventricle has no regional wall motion abnormalities. Left ventricular diastolic parameters are indeterminate.  2. Right ventricular systolic function is normal. The right  ventricular size is normal. There is severely elevated pulmonary artery systolic pressure. The estimated right ventricular systolic pressure is 60.8 mmHg.  3. Left atrial size was  moderately dilated.  4. Right atrial size was mildly dilated.  5. The mitral valve is normal in structure. Mild mitral valve regurgitation. No evidence of mitral stenosis.  6. Tricuspid valve regurgitation is mild to moderate.  7. The aortic valve is normal in structure. Aortic valve regurgitation is not visualized. No aortic stenosis is present.  8. The inferior vena cava is normal in size with greater than 50% respiratory variability, suggesting right atrial pressure of 3 mmHg. FINDINGS  Left Ventricle: Left ventricular ejection fraction, by estimation, is 60 to 65%. The left ventricle has normal function. The left ventricle has no regional wall motion abnormalities. The left ventricular internal cavity size was normal in size. There is  no left ventricular hypertrophy. Left ventricular diastolic parameters are indeterminate. Right Ventricle: The right ventricular size is normal. No increase in right ventricular wall thickness. Right ventricular systolic function is normal. There is severely elevated pulmonary artery systolic pressure. The tricuspid regurgitant velocity is 3.80 m/s, and with an assumed right atrial pressure of 3 mmHg, the estimated right ventricular systolic pressure is 60.8 mmHg. Left Atrium: Left atrial size was moderately dilated. Right Atrium: Right atrial size was mildly dilated. Pericardium: There is no evidence of pericardial effusion. Mitral Valve: The mitral valve is normal in structure. Mild mitral valve regurgitation. No evidence of mitral valve stenosis. Tricuspid Valve: The tricuspid valve is normal in structure. Tricuspid valve regurgitation is mild to moderate. No evidence of tricuspid stenosis. Aortic Valve: The aortic valve is normal in structure. Aortic valve regurgitation is not visualized. No aortic stenosis is present. Aortic valve peak gradient measures 9.4 mmHg. Pulmonic Valve: The pulmonic valve was normal in structure. Pulmonic valve regurgitation is trivial. No evidence  of pulmonic stenosis. Aorta: The aortic root is normal in size and structure. Venous: The inferior vena cava is normal in size with greater than 50% respiratory variability, suggesting right atrial pressure of 3 mmHg. IAS/Shunts: No atrial level shunt detected by color flow Doppler.  LEFT VENTRICLE PLAX 2D LVIDd:         4.90 cm   Diastology LVIDs:         3.10 cm   LV e' medial:    9.90 cm/s LV PW:         1.00 cm   LV E/e' medial:  9.2 LV IVS:        1.10 cm   LV e' lateral:   10.80 cm/s LVOT diam:     2.20 cm   LV E/e' lateral: 8.5 LV SV:         92 LV SV Index:   41 LVOT Area:     3.80 cm  RIGHT VENTRICLE RV Basal diam:  4.10 cm RV S prime:     17.30 cm/s TAPSE (M-mode): 2.6 cm LEFT ATRIUM              Index        RIGHT ATRIUM           Index LA diam:        4.80 cm  2.16 cm/m   RA Area:     20.10 cm LA Vol (A2C):   110.0 ml 49.44 ml/m  RA Volume:   63.70 ml  28.63 ml/m LA Vol (A4C):   100.0 ml 44.94 ml/m LA Biplane Vol: 106.0  ml 47.64 ml/m  AORTIC VALVE                 PULMONIC VALVE AV Area (Vmax): 2.91 cm     PV Vmax:        1.09 m/s AV Vmax:        153.00 cm/s  PV Peak grad:   4.8 mmHg AV Peak Grad:   9.4 mmHg     RVOT Peak grad: 1 mmHg LVOT Vmax:      117.00 cm/s LVOT Vmean:     80.300 cm/s LVOT VTI:       0.241 m  AORTA Ao Root diam: 3.20 cm Ao Asc diam:  3.40 cm MITRAL VALVE               TRICUSPID VALVE MV Area (PHT): 4.06 cm    TR Peak grad:   57.8 mmHg MV Decel Time: 187 msec    TR Vmax:        380.00 cm/s MV E velocity: 91.50 cm/s MV A velocity: 97.30 cm/s  SHUNTS MV E/A ratio:  0.94        Systemic VTI:  0.24 m                            Systemic Diam: 2.20 cm Lorine Bears MD Electronically signed by Lorine Bears MD Signature Date/Time: 07/24/2022/1:16:26 PM    Final    CT CHEST WO CONTRAST  Result Date: 07/23/2022 CLINICAL DATA:  Shortness of breath EXAM: CT CHEST WITHOUT CONTRAST TECHNIQUE: Multidetector CT imaging of the chest was performed following the standard protocol without  IV contrast. RADIATION DOSE REDUCTION: This exam was performed according to the departmental dose-optimization program which includes automated exposure control, adjustment of the mA and/or kV according to patient size and/or use of iterative reconstruction technique. COMPARISON:  None Available. FINDINGS: Cardiovascular: Cardiomegaly. Trace pericardial effusion. Normal caliber thoracic aorta with mild atherosclerotic disease. Severe coronary artery calcifications. Mediastinum/Nodes: Small hiatal hernia. Thyroid is unremarkable. Mildly enlarged mediastinal and hilar lymph nodes, likely reactive. Lungs/Pleura: Central airways are patent. Bilateral central predominant patchy ground-glass opacities with associated interlobular septal thickening. Small right-greater-than-left pleural effusions. Upper Abdomen: No acute abnormality. Musculoskeletal: No chest wall mass or suspicious bone lesions identified. IMPRESSION: 1. Bilateral central predominant patchy ground-glass opacities with associated interlobular septal thickening, consistent with pulmonary edema. 2. Small right-greater-than-left pleural effusions. 3. Cardiomegaly. 4. Coronary artery calcifications and aortic Atherosclerosis (ICD10-I70.0). Electronically Signed   By: Allegra Lai M.D.   On: 07/23/2022 16:12   DG Chest Port 1 View  Result Date: 07/23/2022 CLINICAL DATA:  10031 Cough 10031 EXAM: PORTABLE CHEST 1 VIEW COMPARISON:  CXR 07/20/22 FINDINGS: No pleural effusion. No pneumothorax. Interval increase in conspicuity of bilateral patchy airspace opacities. Borderline cardiomegaly. No radiographically apparent displaced rib fractures. Visualized upper abdomen is notable for enteric contrast opacify splenic flexure. IMPRESSION: Interval increase in conspicuity of bilateral patchy airspace opacities, concerning for multifocal pneumonia. Electronically Signed   By: Lorenza Cambridge M.D.   On: 07/23/2022 10:38   NM Pulmonary Perfusion  Result Date:  07/23/2022 CLINICAL DATA:  Dyspnea, elevated D-dimer EXAM: NUCLEAR MEDICINE PERFUSION LUNG SCAN TECHNIQUE: Perfusion images were obtained in multiple projections after intravenous injection of radiopharmaceutical. Ventilation scans intentionally deferred if perfusion scan and chest x-ray adequate for interpretation during COVID 19 epidemic. RADIOPHARMACEUTICALS:  4.37 mCi Tc-58m MAA IV COMPARISON:  Same day chest radiograph FINDINGS: Cardiomegaly. Homogeneous perfusion of the lungs.  No suspicious perfusion defect. IMPRESSION: 1. Very low probability for pulmonary embolism by modified perfusion only PIOPED criteria (PE absent). 2.  Cardiomegaly. Electronically Signed   By: Jearld Lesch M.D.   On: 07/23/2022 10:36   DG Chest 2 View  Result Date: 07/20/2022 CLINICAL DATA:  Provided history: Painful cough. Additional history provided: Cough. Wheezing. Chills. EXAM: CHEST - 2 VIEW COMPARISON:  Prior chest radiographs 07/15/2022 and earlier. FINDINGS: Heart size within normal limits. Patchy and hazy pulmonary opacities bilaterally, new from the prior examination of 07/15/2022. Chronic elevation of the right hemidiaphragm. No evidence of pleural effusion or pneumothorax. No acute osseous abnormality identified. Dextrocurvature of the imaged thoracolumbar spine. A vascular stent projects in the right subclavian/axillary region. Residual contrast within the colon. IMPRESSION: 1. Patchy and hazy pulmonary opacities bilaterally, new from the prior examination of 07/15/2022. Findings may reflect infection and/or edema. 2. Chronic elevation of the right hemidiaphragm. 3. Dextrocurvature of the imaged thoracolumbar spine. Electronically Signed   By: Jackey Loge D.O.   On: 07/20/2022 11:24   DG Chest 2 View  Result Date: 07/15/2022 CLINICAL DATA:  Shortness of breath, chest pain EXAM: CHEST - 2 VIEW COMPARISON:  05/11/2022 FINDINGS: The heart size and mediastinal contours are within normal limits. Both lungs are clear.  The visualized skeletal structures are unremarkable. IMPRESSION: No active cardiopulmonary disease. Electronically Signed   By: Charlett Nose M.D.   On: 07/15/2022 21:35   DG Ankle Complete Left  Result Date: 07/01/2022 CLINICAL DATA:  Abnormal gait, left ankle pain. EXAM: LEFT ANKLE COMPLETE - 3+ VIEW COMPARISON:  None Available. FINDINGS: There is no evidence of fracture, dislocation, or joint effusion. There is no evidence of arthropathy or other focal bone abnormality. Soft tissue swelling about the ankle. IMPRESSION: Soft tissue swelling about the ankle. No acute fracture or dislocation. Electronically Signed   By: Larose Hires D.O.   On: 07/01/2022 11:07    Microbiology: Recent Results (from the past 240 hour(s))  SARS Coronavirus 2 by RT PCR (hospital order, performed in [facility] hospital lab) *cepheid single result test* Anterior Nasal Swab     Status: None   Collection Time: 07/20/22 10:53 AM   Specimen: Anterior Nasal Swab  Result Value Ref Range Status   SARS Coronavirus 2 by RT PCR NEGATIVE NEGATIVE Final    Comment: (NOTE) SARS-CoV-2 target nucleic acids are NOT DETECTED.  The SARS-CoV-2 RNA is generally detectable in upper and lower respiratory specimens during the acute phase of infection. The lowest concentration of SARS-CoV-2 viral copies this assay can detect is 250 copies / mL. A negative result does not preclude SARS-CoV-2 infection and should not be used as the sole basis for treatment or other patient management decisions.  A negative result may occur with improper specimen collection / handling, submission of specimen other than nasopharyngeal swab, presence of viral mutation(s) within the areas targeted by this assay, and inadequate number of viral copies (<250 copies / mL). A negative result must be combined with clinical observations, patient history, and epidemiological information.  Fact Sheet for Patients:   RoadLapTop.co.za  Fact  Sheet for Healthcare Providers: http://kim-miller.com/  This test is not yet approved or  cleared by the [place] FDA and has been authorized for detection and/or diagnosis of SARS-CoV-2 by FDA under an Emergency Use Authorization (EUA).  This EUA will remain in effect (meaning this test can be used) for the duration of the COVID-19 declaration under Section 564(b)(1) of the Act, 21 U.S.C.  section 360bbb-3(b)(1), unless the authorization is terminated or revoked sooner.  Performed at [facility], [address] [place], [place] [address]   Respiratory (~20 pathogens) panel by PCR     Status: None   Collection Time: 07/20/22 11:48 PM   Specimen: Nasopharyngeal Swab; Respiratory  Result Value Ref Range Status   Adenovirus NOT DETECTED NOT DETECTED Final   Coronavirus 229E NOT DETECTED NOT DETECTED Final    Comment: (NOTE) The Coronavirus on the Respiratory Panel, DOES NOT test for the novel  Coronavirus (2019 nCoV)    Coronavirus HKU1 NOT DETECTED NOT DETECTED Final   Coronavirus NL63 NOT DETECTED NOT DETECTED Final   Coronavirus OC43 NOT DETECTED NOT DETECTED Final   Metapneumovirus NOT DETECTED NOT DETECTED Final   Rhinovirus / Enterovirus NOT DETECTED NOT DETECTED Final   Influenza A NOT DETECTED NOT DETECTED Final   Influenza B NOT DETECTED NOT DETECTED Final   Parainfluenza Virus 1 NOT DETECTED NOT DETECTED Final   Parainfluenza Virus 2 NOT DETECTED NOT DETECTED Final   Parainfluenza Virus 3 NOT DETECTED NOT DETECTED Final   Parainfluenza Virus 4 NOT DETECTED NOT DETECTED Final   Respiratory Syncytial Virus NOT DETECTED NOT DETECTED Final   Bordetella pertussis NOT DETECTED NOT DETECTED Final   Bordetella Parapertussis NOT DETECTED NOT DETECTED Final   Chlamydophila pneumoniae NOT DETECTED NOT DETECTED Final   Mycoplasma pneumoniae NOT DETECTED NOT DETECTED Final    Comment: Performed at [facility], [address],  [place], [place] [address]  MRSA Next Gen by PCR, Nasal     Status: None   Collection Time: 07/20/22 11:48 PM   Specimen: Nasal Mucosa; Nasal Swab  Result Value Ref Range Status   MRSA by PCR Next Gen NOT DETECTED NOT DETECTED Final    Comment: (NOTE) The GeneXpert MRSA Assay (FDA approved for NASAL specimens only), is one component of a comprehensive MRSA colonization surveillance program. It is not intended to diagnose MRSA infection nor to guide or monitor treatment for MRSA infections. Test performance is not FDA approved in patients less than 73 years old. Performed at [facility], [address] [place], [place] [address]   SARS Coronavirus 2 by RT PCR (hospital order, performed in [facility] hospital lab) *cepheid single result test* Anterior Nasal Swab     Status: None   Collection Time: 07/23/22  1:30 PM   Specimen: Anterior Nasal Swab  Result Value Ref Range Status   SARS Coronavirus 2 by RT PCR NEGATIVE NEGATIVE Final    Comment: (NOTE) SARS-CoV-2 target nucleic acids are NOT DETECTED.  The SARS-CoV-2 RNA is generally detectable in upper and lower respiratory specimens during the acute phase of infection. The lowest concentration of SARS-CoV-2 viral copies this assay can detect is 250 copies / mL. A negative result does not preclude SARS-CoV-2 infection and should not be used as the sole basis for treatment or other patient management decisions.  A negative result may occur with improper specimen collection / handling, submission of specimen other than nasopharyngeal swab, presence of viral mutation(s) within the areas targeted by this assay, and inadequate number of viral copies (<250 copies / mL). A negative result must be combined with clinical observations, patient history, and epidemiological information.  Fact Sheet for Patients:   RoadLapTop.co.za  Fact Sheet for Healthcare  Providers: http://kim-miller.com/  This test is not yet approved or  cleared by the [place] FDA and has been authorized for detection and/or diagnosis of SARS-CoV-2  by FDA under an Emergency Use Authorization (EUA).  This EUA will remain in effect (meaning this test can be used) for the duration of the COVID-19 declaration under Section 564(b)(1) of the Act, 21 U.S.C. section 360bbb-3(b)(1), unless the authorization is terminated or revoked sooner.  Performed at [facility], [address] [place], [place] [address]      Labs: Basic Metabolic Panel: Recent Labs  Lab 07/20/22 1227 07/21/22 0413 07/22/22 0810 07/23/22 1050 07/24/22 0834  NA 141 134* 132* 131* 133*  K 6.1* 4.5 5.3* 5.1 5.5*  CL 102 92* 92* 93* 93*  CO2 26 28 27 25 24   GLUCOSE 101* 105* 103* 100* 129*  BUN 70* 37* 52* 35* 53*  CREATININE 9.90* 6.18* 8.27* 6.57* 7.93*  CALCIUM 9.1 8.9 8.7* 9.0 9.0  PHOS  --   --  6.4*  --  5.9*   Liver Function Tests: Recent Labs  Lab 07/20/22 1227 07/22/22 0810  AST 43*  --   ALT 9  --   ALKPHOS 80  --   BILITOT 0.4  --   PROT 7.3  --   ALBUMIN 3.5 3.5   No results for input(s): "LIPASE", "AMYLASE" in the last 168 hours. No results for input(s): "AMMONIA" in the last 168 hours. CBC: Recent Labs  Lab 07/20/22 1227 07/21/22 0413 07/22/22 0810 07/23/22 1050 07/24/22 0834  WBC 6.8 8.0 7.3 7.0 5.4  HGB 8.1* 8.5* 7.6* 8.7* 8.1*  HCT 26.3* 26.8* 23.9* 27.8* 24.7*  MCV 86.5 83.0 83.0 85.0 81.0  PLT 258 281 237 217 257   Cardiac Enzymes: No results for input(s): "CKTOTAL", "CKMB", "CKMBINDEX", "TROPONINI" in the last 168 hours. BNP: BNP (last 3 results) Recent Labs    05/11/22 0056 07/23/22 1050  BNP 624.9* 885.2*    ProBNP (last 3 results) No results for input(s): "PROBNP" in the last 8760 hours.  CBG: No results for input(s): "GLUCAP" in the last 168 hours.     Signed:  Silvano Bilis MD.  [facility] 07/24/2022, 4:25 PM

## 2022-07-24 NOTE — Progress Notes (Signed)
PT Cancellation Note  Patient Details Name: Patricia [name] MRN: [id number] DOB: 07/04/1975   Cancelled Treatment:    Reason Eval/Treat Not Completed: Other (comment). Pt out of room at this time, PT to re-attempt as able.    Olga Coaster PT, DPT 10:55 AM,07/24/22

## 2022-07-24 NOTE — Progress Notes (Signed)
Physical Therapy Treatment Patient Details Name: Patricia [name] MRN: [id number] DOB: [birth year] Today's Date: 07/24/2022   History of Present Illness Pt is a 47 y/o F who presents with cough and SOB, also had conerns of chest pain and nausea, diagnosed with UTI and hypetensive emergency, being managed by medication. PMH significant for ESRD on dialysis, HTN, minimal change glomerulonephritis, DM, neuorpathy, CKD, facial twitch, and gout    PT Comments  Patient agreeable to PT session. Increased activity tolerance today. The patient is modified independent with bed mobility and transfers. Supervision provided for safety with ambulation in hallway. Patient walked a lap in the hallway with no dizziness reported. Mild dyspnea with exertion with Sp02 92% on room air. Energy conservation techniques encouraged for home setting. Anticipate supervision assistance required after this hospital stay.     Assistance Recommended at Discharge Set up Supervision/Assistance  If plan is discharge home, recommend the following:  Can travel by private vehicle    Assist for transportation;Help with stairs or ramp for entrance   Yes  Equipment Recommendations  None recommended by PT    Recommendations for Other Services       Precautions / Restrictions Precautions Precautions: Fall Restrictions Weight Bearing Restrictions: No     Mobility  Bed Mobility Overal bed mobility: Modified Independent                  Transfers Overall transfer level: Modified independent                      Ambulation/Gait Ambulation/Gait assistance: Supervision Gait Distance (Feet): 175 Feet Assistive device: None Gait Pattern/deviations: Step-through pattern Gait velocity: decreased     General Gait Details: patient ambulated a lap in the hallway with no loss of balance. mild dyspnea after walking with Sp02 92% on room air   Stairs             Wheelchair Mobility      Tilt Bed    Modified Rankin (Stroke Patients Only)       Balance Overall balance assessment: Needs assistance Sitting-balance support: Feet supported, Bilateral upper extremity supported Sitting balance-Leahy Scale: Good     Standing balance support: No upper extremity supported Standing balance-Leahy Scale: Fair                              Cognition Arousal/Alertness: Awake/alert Behavior During Therapy: WFL for tasks assessed/performed Overall Cognitive Status: Within Functional Limits for tasks assessed                                          Exercises      General Comments General comments (skin integrity, edema, etc.): reinforced energy conservation techniques which patient reports she uses at home      Pertinent Vitals/Pain Pain Assessment Pain Assessment: No/denies pain    Home Living                          Prior Function            PT Goals (current goals can now be found in the care plan section) Acute Rehab PT Goals Patient Stated Goal: to return home PT Goal Formulation: With patient Time For Goal Achievement: 08/06/22 Potential to Achieve Goals: Good Progress towards PT goals: Progressing  toward goals    Frequency    Min 1X/week      PT Plan Discharge plan needs to be updated    Co-evaluation              AM-PAC PT "6 Clicks" Mobility   Outcome Measure  Help needed turning from your back to your side while in a flat bed without using bedrails?: A Little Help needed moving from lying on your back to sitting on the side of a flat bed without using bedrails?: A Little Help needed moving to and from a bed to a chair (including a wheelchair)?: A Little Help needed standing up from a chair using your arms (e.g., wheelchair or bedside chair)?: A Little     6 Click Score: 12    End of Session   Activity Tolerance: Patient tolerated treatment well Patient left: with call bell/phone  within reach (seated on side of bed per patient preference) Nurse Communication: Mobility status (requesting for nausea meds) PT Visit Diagnosis: Unsteadiness on feet (R26.81);Muscle weakness (generalized) (M62.81);Other abnormalities of gait and mobility (R26.89)     Time: 9604-5409 PT Time Calculation (min) (ACUTE ONLY): 24 min  Charges:    $Therapeutic Activity: 23-37 mins PT General Charges $$ ACUTE PT VISIT: 1 Visit                     Donna Bernard, PT, MPT    Ina Homes 07/24/2022, 2:51 PM

## 2022-07-24 NOTE — Evaluation (Signed)
Occupational Therapy Evaluation Patient Details Name: Patricia [name] MRN: [id number] DOB: [birth year] Today's Date: 07/24/2022   History of Present Illness Pt is a 47 y/o F who presents with cough and SOB, also had conerns of chest pain and nausea, diagnosed with UTI and hypetensive emergency, being managed by medication. PMH significant for ESRD on dialysis, HTN, minimal change glomerulonephritis, DM, neuorpathy, CKD, facial twitch, and gout   Clinical Impression   Pt was seen for OT evaluation this date. Prior to hospital admission, pt was living with her two daughters and granddaughter in a 2 story home. She sometimes required assist for bathing/dressing and getting up to the 2nd fl bedroom from daughter if having a "bad day." Pt presents to acute OT demonstrating impaired ADL performance and functional mobility 2/2 decreased strength and activity tolerance (See OT problem list for additional functional deficits). Pt currently requires increased time/effort to complete tasks. Pt educated in energy conservation strategies and facilitated problem solving for ADL and incorporating pacing, rest breaks, AE/DME, and prioritizing meaningful occupational engagement to promote quality of life. Pt verbalized understanding. Pt would benefit from skilled OT services at next venue of care to address noted impairments and functional limitations (see below for any additional details) in order to maximize safety and independence while minimizing falls risk and caregiver burden. TOC notified.     Recommendations for follow up therapy are one component of a multi-disciplinary discharge planning process, led by the attending physician.  Recommendations may be updated based on patient status, additional functional criteria and insurance authorization.   Assistance Recommended at Discharge PRN  Patient can return home with the following A little help with bathing/dressing/bathroom;Assistance with  cooking/housework;Assist for transportation;Help with stairs or ramp for entrance    Functional Status Assessment  Patient has had a recent decline in their functional status and demonstrates the ability to make significant improvements in function in a reasonable and predictable amount of time.  Equipment Recommendations  None recommended by OT    Recommendations for Other Services       Precautions / Restrictions Precautions Precautions: Fall Restrictions Weight Bearing Restrictions: No      Mobility Bed Mobility Overal bed mobility: Modified Independent                  Transfers Overall transfer level: Modified independent Equipment used: None                      Balance Overall balance assessment: Needs assistance Sitting-balance support: Feet supported, Bilateral upper extremity supported Sitting balance-Leahy Scale: Good     Standing balance support: No upper extremity supported Standing balance-Leahy Scale: Fair                             ADL either performed or assessed with clinical judgement   ADL Overall ADL's : Modified independent                                       General ADL Comments: Increased effort/time     Vision         Perception     Praxis      Pertinent Vitals/Pain Pain Assessment Pain Assessment: No/denies pain     Hand Dominance     Extremity/Trunk Assessment Upper Extremity Assessment Upper Extremity Assessment: Generalized weakness   Lower Extremity  Assessment Lower Extremity Assessment: Generalized weakness       Communication Communication Communication: No difficulties   Cognition Arousal/Alertness: Awake/alert Behavior During Therapy: WFL for tasks assessed/performed Overall Cognitive Status: Within Functional Limits for tasks assessed                                       General Comments  reinforced energy conservation techniques which  patient reports she uses at home    Exercises Other Exercises Other Exercises: Pt educated in energy conservation strategies and facilitated problem solving for ADL and incorporating pacing, rest breaks, AE/DME, and prioritizing meaningful occupational engagement to promote quality of life.   Shoulder Instructions      Home Living Family/patient expects to be discharged to:: Private residence Living Arrangements: Children Available Help at Discharge: Family Type of Home: House Home Access: Level entry     Home Layout: Two level Alternate Level Stairs-Number of Steps: 14 Alternate Level Stairs-Rails: Right Bathroom Shower/Tub: Chief Strategy Officer: Standard Bathroom Accessibility: Yes   Home Equipment: Grab bars - tub/shower;Hand held shower head;Shower seat          Prior Functioning/Environment Prior Level of Function : Needs assist       Physical Assist : ADLs (physical)   ADLs (physical): IADLs Mobility Comments: no AD at baseline ADLs Comments: pt reports occasionally needing assistance from daugthers for ADL's on her "bad days", daughters cook and provide most transportation        OT Problem List: Decreased activity tolerance;Decreased knowledge of use of DME or AE      OT Treatment/Interventions:      OT Goals(Current goals can be found in the care plan section) Acute Rehab OT Goals Patient Stated Goal: get better and be able to play with her granddaughter OT Goal Formulation: All assessment and education complete, DC therapy  OT Frequency:      Co-evaluation              AM-PAC OT "6 Clicks" Daily Activity     Outcome Measure Help from another person eating meals?: None Help from another person taking care of personal grooming?: None Help from another person toileting, which includes using toliet, bedpan, or urinal?: None Help from another person bathing (including washing, rinsing, drying)?: A Little Help from another person to  put on and taking off regular upper body clothing?: None Help from another person to put on and taking off regular lower body clothing?: None 6 Click Score: 23   End of Session    Activity Tolerance: Patient tolerated treatment well Patient left: in chair;with call bell/phone within reach  OT Visit Diagnosis: Other abnormalities of gait and mobility (R26.89)                Time: 9604-5409 OT Time Calculation (min): 21 min Charges:  OT General Charges $OT Visit: 1 Visit OT Evaluation $OT Eval Low Complexity: 1 Low OT Treatments $Self Care/Home Management : 8-22 mins  Arman Filter., MPH, MS, OTR/L ascom [phone] 07/24/22, 3:12 PM

## 2022-07-24 NOTE — TOC Transition Note (Signed)
Transition of Care [facility]) - CM/SW Discharge Note   Patient Details  Name: Patricia [name] MRN: [id number] Date of Birth: Nov 02, 1975  [facility] CM/SW Contact:  Margarito Liner, LCSW Phone Number: 07/24/2022, 4:31 PM   Clinical Narrative:  Patient has orders to discharge home today. Left message for [facility] liaison to notify. No further concerns. CSW signing off.   Final next level of care: Home w Home Health Services Barriers to Discharge: Barriers Resolved   Patient Goals and CMS Choice      Discharge Placement                  Patient to be transferred to facility by: Daughter   Patient and family notified of of transfer: 07/24/22  Discharge Plan and Services Additional resources added to the After Visit Summary for                            [facility] Arranged: PT, OT [facility] Agency: [facility] Date [facility] Agency Contacted: 07/24/22   Representative spoke with at [facility] Agency: Lorenza Chick  Social Determinants of Health (SDOH) Interventions SDOH Screenings   Food Insecurity: No Food Insecurity (07/20/2022)  Housing: Low Risk  (07/20/2022)  Transportation Needs: No Transportation Needs (07/20/2022)  Utilities: Not At Risk (07/20/2022)  Financial Resource Strain: Low Risk  (09/04/2019)   Received from [facility], [facility]  Physical Activity: Insufficiently Active (09/04/2019)   Received from [facility], [facility]  Social Connections: Unknown (05/10/2021)   Received from [facility], [facility]  Stress: Stress Concern Present (09/04/2019)   Received from [facility], [facility]  Tobacco Use: Low Risk  (07/20/2022)  Health Literacy: Low Risk  (07/04/2019)   Received from [facility], [facility]     Readmission Risk Interventions    07/23/2022    9:17 AM 05/13/2022    1:57 PM 05/12/2022    3:25 PM  Readmission Risk Prevention Plan  Transportation Screening Complete  Complete  Medication Review (RN  Care Manager) Complete  Complete  PCP or Specialist appointment within 3-5 days of discharge  -- Complete  HRI or Home Care Consult  Not Complete   HRI or Home Care Consult Pt Refusal Comments  NA   SW Recovery Care/Counseling Consult Complete  Complete  Palliative Care Screening Not Applicable  Not Applicable  Skilled Nursing Facility Not Applicable  Not Applicable

## 2022-07-28 ENCOUNTER — Other Ambulatory Visit: Payer: Self-pay

## 2022-07-28 ENCOUNTER — Emergency Department: Payer: Medicare Other

## 2022-07-28 ENCOUNTER — Emergency Department
Admission: EM | Admit: 2022-07-28 | Discharge: 2022-07-28 | Disposition: A | Discharge status: Home / Self Care | Payer: Medicare Other | Attending: Emergency Medicine | Admitting: Emergency Medicine

## 2022-07-28 DIAGNOSIS — R059 Cough, unspecified: Secondary | ICD-10-CM | POA: Insufficient documentation

## 2022-07-28 DIAGNOSIS — R0981 Nasal congestion: Secondary | ICD-10-CM | POA: Diagnosis not present

## 2022-07-28 DIAGNOSIS — N186 End stage renal disease: Secondary | ICD-10-CM | POA: Diagnosis not present

## 2022-07-28 DIAGNOSIS — R0602 Shortness of breath: Secondary | ICD-10-CM | POA: Diagnosis not present

## 2022-07-28 DIAGNOSIS — R5383 Other fatigue: Secondary | ICD-10-CM | POA: Diagnosis not present

## 2022-07-28 DIAGNOSIS — Z992 Dependence on renal dialysis: Secondary | ICD-10-CM | POA: Insufficient documentation

## 2022-07-28 LAB — CBC WITH DIFFERENTIAL/PLATELET
Abs Immature Granulocytes: 0.03 10*3/uL (ref 0.00–0.07)
Basophils Absolute: 0 10*3/uL (ref 0.0–0.1)
Basophils Relative: 0 %
Eosinophils Absolute: 0.3 10*3/uL (ref 0.0–0.5)
Eosinophils Relative: 5 %
HCT: 26.9 % — ABNORMAL LOW (ref 36.0–46.0)
Hemoglobin: 8.3 g/dL — ABNORMAL LOW (ref 12.0–15.0)
Immature Granulocytes: 1 %
Lymphocytes Relative: 16 %
Lymphs Abs: 0.8 10*3/uL (ref 0.7–4.0)
MCH: 25.6 pg — ABNORMAL LOW (ref 26.0–34.0)
MCHC: 30.9 g/dL (ref 30.0–36.0)
MCV: 83 fL (ref 80.0–100.0)
Monocytes Absolute: 0.5 10*3/uL (ref 0.1–1.0)
Monocytes Relative: 9 %
Neutro Abs: 3.6 10*3/uL (ref 1.7–7.7)
Neutrophils Relative %: 69 %
Platelets: 208 10*3/uL (ref 150–400)
RBC: 3.24 MIL/uL — ABNORMAL LOW (ref 3.87–5.11)
RDW: 16 % — ABNORMAL HIGH (ref 11.5–15.5)
WBC: 5.1 10*3/uL (ref 4.0–10.5)
nRBC: 0 % (ref 0.0–0.2)

## 2022-07-28 LAB — COMPREHENSIVE METABOLIC PANEL
ALT: 5 U/L (ref 0–44)
AST: 29 U/L (ref 15–41)
Albumin: 3.6 g/dL (ref 3.5–5.0)
Alkaline Phosphatase: 70 U/L (ref 38–126)
Anion gap: 11 (ref 5–15)
BUN: 35 mg/dL — ABNORMAL HIGH (ref 6–20)
CO2: 27 mmol/L (ref 22–32)
Calcium: 8.4 mg/dL — ABNORMAL LOW (ref 8.9–10.3)
Chloride: 94 mmol/L — ABNORMAL LOW (ref 98–111)
Creatinine, Ser: 6.8 mg/dL — ABNORMAL HIGH (ref 0.44–1.00)
GFR, Estimated: 7 mL/min — ABNORMAL LOW (ref >60)
Glucose, Bld: 98 mg/dL (ref 70–99)
Potassium: 4.5 mmol/L (ref 3.5–5.1)
Sodium: 132 mmol/L — ABNORMAL LOW (ref 135–145)
Total Bilirubin: 0.6 mg/dL (ref 0.3–1.2)
Total Protein: 7 g/dL (ref 6.5–8.1)

## 2022-07-28 MED ORDER — ALBUTEROL SULFATE HFA 108 (90 BASE) MCG/ACT IN AERS: 2.0000 | INHALATION_SPRAY | RESPIRATORY_TRACT | Status: DC | PRN

## 2022-07-28 NOTE — ED Provider Notes (Signed)
[facility] Provider Note   Event Date/Time   First MD Initiated Contact with Patient 07/28/22 1346     (approximate) History  Shortness of Breath, Cough, and Nasal Congestion  HPI Patricia [name] is a 47 y.o. female with a past medical history of end-stage renal disease on dialysis M/W/F who presents complaining of nasal congestion, generalized fatigue, and shortness of breath that has been present over the last 4 days and has been worsening since onset.  Patient denies any recent travel or sick contacts however states that she "goes to dialysis regularly and people cough all the time".  Patient denies fevers ROS: Patient currently denies any vision changes, tinnitus, difficulty speaking, facial droop, sore throat, chest pain, abdominal pain, nausea/vomiting/diarrhea, dysuria, or weakness/numbness/paresthesias in any extremity   Physical Exam  Triage Vital Signs: ED Triage Vitals  Encounter Vitals Group     BP 07/28/22 1205 120/75     Systolic BP Percentile --      Diastolic BP Percentile --      Pulse Rate 07/28/22 1205 72     Resp 07/28/22 1209 18     Temp 07/28/22 1205 98.3 F (36.8 C)     Temp Source 07/28/22 1205 Oral     SpO2 07/28/22 1205 99 %     Weight 07/28/22 1206 237 lb (107.5 kg)     Height 07/28/22 1206 5\' 8"  (1.727 m)     Head Circumference --      Peak Flow --      Pain Score 07/28/22 1206 0     Pain Loc --      Pain Education --      Exclude from Growth Chart --    Most recent vital signs: Vitals:   07/28/22 1209 07/28/22 1335  BP:  (!) 140/76  Pulse:  75  Resp: 18 17  Temp:    SpO2:  97%   General: Awake, oriented x4. CV:  Good peripheral perfusion.  Resp:  Normal effort.  Abd:  No distention.  Other:  Middle-aged obese African-American female laying in bed in no acute distress ED Results / Procedures / Treatments  Labs (all labs ordered are listed, but only abnormal results are displayed) Labs Reviewed  CBC  WITH DIFFERENTIAL/PLATELET - Abnormal; Notable for the following components:      Result Value   RBC 3.24 (*)    Hemoglobin 8.3 (*)    HCT 26.9 (*)    MCH 25.6 (*)    RDW 16.0 (*)    All other components within normal limits  COMPREHENSIVE METABOLIC PANEL - Abnormal; Notable for the following components:   Sodium 132 (*)    Chloride 94 (*)    BUN 35 (*)    Creatinine, Ser 6.80 (*)    Calcium 8.4 (*)    GFR, Estimated 7 (*)    All other components within normal limits   EKG ED ECG REPORT I, Merwyn Katos, the attending physician, personally viewed and interpreted this ECG. Date: 07/28/2022 EKG Time: 1216 Rate: 73 Rhythm: normal sinus rhythm QRS Axis: normal Intervals: normal ST/T Wave abnormalities: normal Narrative Interpretation: no evidence of acute ischemia RADIOLOGY ED MD interpretation: 2 view chest x-ray interpreted independently by me and shows low lung volumes without evidence of acute cardiopulmonary disease -Agree with radiology assessment Official radiology report(s): DG Chest 2 View  Result Date: 07/28/2022 CLINICAL DATA:  Shortness of breath. EXAM: CHEST - 2 VIEW COMPARISON:  07/23/2022. FINDINGS: Improved  aeration of the lungs. Low lung volumes accentuate the pulmonary vasculature and cardiomediastinal silhouette. No consolidation or pulmonary edema. No pleural effusion or pneumothorax. IMPRESSION: Low lung volumes without evidence of acute cardiopulmonary disease. Electronically Signed   By: Orvan Falconer M.D.   On: 07/28/2022 13:08   PROCEDURES: Critical Care performed: No Procedures MEDICATIONS ORDERED IN ED: Medications  albuterol (VENTOLIN HFA) 108 (90 Base) MCG/ACT inhaler 2 puff (has no administration in time range)   IMPRESSION / MDM / ASSESSMENT AND PLAN / ED COURSE  I reviewed the triage vital signs and the nursing notes.                             The patient is on the cardiac monitor to evaluate for evidence of arrhythmia and/or significant  heart rate changes. Patient's presentation is most consistent with acute presentation with potential threat to life or bodily function. Otherwise healthy patient presenting with constellation of symptoms likely representing uncomplicated viral upper respiratory symptoms as characterized by mild nasal congestion, shortness of breath, and cough  Unlikely PTA/RPA: no hot potato voice, no uvular deviation, Unlikely Esophageal rupture: No history of dysphagia Unlikely deep space infection/Ludwigs Low suspicion for CNS infection bacterial sinusitis, or pneumonia given exam and history.  Unlikely Strep or EBV as centor negative and with no pharyngeal exudate, posterior LAD, or splenomegaly.  Will attempt to alleviate symptoms conservatively; no overt indications at this time for antibiotics. No respiratory distress, otherwise relatively well appearing and nontoxic. Will discuss prompt follow up with PMD and strict return precautions.   FINAL CLINICAL IMPRESSION(S) / ED DIAGNOSES   Final diagnoses:  Shortness of breath  Nasal congestion  Other fatigue   Rx / DC Orders   ED Discharge Orders     None      Note:  This document was prepared using Dragon voice recognition software and may include unintentional dictation errors.   Merwyn Katos, MD 07/28/22 1357

## 2022-07-28 NOTE — ED Triage Notes (Signed)
Pt arrives to triage via POV w/ c/o SHOB, cough, nasal congestion. Pt reports she was dcd Friday and was being treated for "fluid on lungs" and pna. Pt states since she left the hospital she has only started to feel worse. Pt denies fevers at home.

## 2022-07-28 NOTE — Discharge Instructions (Signed)
Please use Coricidin HBP for any continued cough or congestion

## 2022-07-28 NOTE — ED Notes (Signed)
Lab called for blood work

## 2022-08-09 ENCOUNTER — Inpatient Hospital Stay
Admission: EM | Admit: 2022-08-09 | Discharge: 2022-08-13 | DRG: 291 | Disposition: A | Discharge status: Home / Self Care | Payer: Medicare Other | Attending: Internal Medicine | Admitting: Internal Medicine

## 2022-08-09 ENCOUNTER — Other Ambulatory Visit: Payer: Self-pay

## 2022-08-09 ENCOUNTER — Encounter: Payer: Self-pay | Admitting: Emergency Medicine

## 2022-08-09 ENCOUNTER — Emergency Department: Payer: Medicare Other

## 2022-08-09 DIAGNOSIS — I10 Essential (primary) hypertension: Secondary | ICD-10-CM | POA: Diagnosis present

## 2022-08-09 DIAGNOSIS — N186 End stage renal disease: Secondary | ICD-10-CM | POA: Diagnosis not present

## 2022-08-09 DIAGNOSIS — Z90710 Acquired absence of both cervix and uterus: Secondary | ICD-10-CM

## 2022-08-09 DIAGNOSIS — E7800 Pure hypercholesterolemia, unspecified: Secondary | ICD-10-CM | POA: Diagnosis present

## 2022-08-09 DIAGNOSIS — G629 Polyneuropathy, unspecified: Secondary | ICD-10-CM | POA: Diagnosis present

## 2022-08-09 DIAGNOSIS — G609 Hereditary and idiopathic neuropathy, unspecified: Secondary | ICD-10-CM | POA: Diagnosis present

## 2022-08-09 DIAGNOSIS — I5033 Acute on chronic diastolic (congestive) heart failure: Secondary | ICD-10-CM | POA: Diagnosis present

## 2022-08-09 DIAGNOSIS — Z7682 Awaiting organ transplant status: Secondary | ICD-10-CM

## 2022-08-09 DIAGNOSIS — J1282 Pneumonia due to coronavirus disease 2019: Secondary | ICD-10-CM | POA: Diagnosis present

## 2022-08-09 DIAGNOSIS — I132 Hypertensive heart and chronic kidney disease with heart failure and with stage 5 chronic kidney disease, or end stage renal disease: Secondary | ICD-10-CM | POA: Diagnosis not present

## 2022-08-09 DIAGNOSIS — Z8701 Personal history of pneumonia (recurrent): Secondary | ICD-10-CM

## 2022-08-09 DIAGNOSIS — Z79899 Other long term (current) drug therapy: Secondary | ICD-10-CM

## 2022-08-09 DIAGNOSIS — D631 Anemia in chronic kidney disease: Secondary | ICD-10-CM | POA: Diagnosis present

## 2022-08-09 DIAGNOSIS — F419 Anxiety disorder, unspecified: Secondary | ICD-10-CM | POA: Diagnosis present

## 2022-08-09 DIAGNOSIS — R253 Fasciculation: Secondary | ICD-10-CM | POA: Diagnosis present

## 2022-08-09 DIAGNOSIS — Z6836 Body mass index (BMI) 36.0-36.9, adult: Secondary | ICD-10-CM

## 2022-08-09 DIAGNOSIS — E1140 Type 2 diabetes mellitus with diabetic neuropathy, unspecified: Secondary | ICD-10-CM | POA: Diagnosis present

## 2022-08-09 DIAGNOSIS — Z833 Family history of diabetes mellitus: Secondary | ICD-10-CM

## 2022-08-09 DIAGNOSIS — Z800 Family history of malignant neoplasm of digestive organs: Secondary | ICD-10-CM

## 2022-08-09 DIAGNOSIS — G514 Facial myokymia: Secondary | ICD-10-CM | POA: Diagnosis present

## 2022-08-09 DIAGNOSIS — J810 Acute pulmonary edema: Secondary | ICD-10-CM | POA: Diagnosis present

## 2022-08-09 DIAGNOSIS — E669 Obesity, unspecified: Secondary | ICD-10-CM | POA: Diagnosis present

## 2022-08-09 DIAGNOSIS — Z992 Dependence on renal dialysis: Secondary | ICD-10-CM

## 2022-08-09 DIAGNOSIS — E1122 Type 2 diabetes mellitus with diabetic chronic kidney disease: Secondary | ICD-10-CM | POA: Diagnosis present

## 2022-08-09 DIAGNOSIS — M1990 Unspecified osteoarthritis, unspecified site: Secondary | ICD-10-CM | POA: Diagnosis present

## 2022-08-09 DIAGNOSIS — R0602 Shortness of breath: Secondary | ICD-10-CM | POA: Diagnosis not present

## 2022-08-09 DIAGNOSIS — U071 COVID-19: Secondary | ICD-10-CM | POA: Diagnosis present

## 2022-08-09 DIAGNOSIS — G249 Dystonia, unspecified: Secondary | ICD-10-CM | POA: Diagnosis present

## 2022-08-09 DIAGNOSIS — Z8249 Family history of ischemic heart disease and other diseases of the circulatory system: Secondary | ICD-10-CM

## 2022-08-09 DIAGNOSIS — N2581 Secondary hyperparathyroidism of renal origin: Secondary | ICD-10-CM | POA: Diagnosis present

## 2022-08-09 DIAGNOSIS — I2510 Atherosclerotic heart disease of native coronary artery without angina pectoris: Secondary | ICD-10-CM | POA: Diagnosis present

## 2022-08-09 DIAGNOSIS — E1129 Type 2 diabetes mellitus with other diabetic kidney complication: Secondary | ICD-10-CM | POA: Diagnosis not present

## 2022-08-09 DIAGNOSIS — Z886 Allergy status to analgesic agent status: Secondary | ICD-10-CM

## 2022-08-09 DIAGNOSIS — E875 Hyperkalemia: Secondary | ICD-10-CM | POA: Diagnosis present

## 2022-08-09 DIAGNOSIS — I151 Hypertension secondary to other renal disorders: Secondary | ICD-10-CM | POA: Diagnosis not present

## 2022-08-09 DIAGNOSIS — R0600 Dyspnea, unspecified: Secondary | ICD-10-CM | POA: Diagnosis present

## 2022-08-09 DIAGNOSIS — Z8349 Family history of other endocrine, nutritional and metabolic diseases: Secondary | ICD-10-CM

## 2022-08-09 DIAGNOSIS — R0603 Acute respiratory distress: Secondary | ICD-10-CM | POA: Diagnosis present

## 2022-08-09 LAB — CBC
HCT: 22 % — ABNORMAL LOW (ref 36.0–46.0)
HCT: 20.9 % — ABNORMAL LOW (ref 36.0–46.0)
Hemoglobin: 6.7 g/dL — ABNORMAL LOW (ref 12.0–15.0)
Hemoglobin: 6.5 g/dL — ABNORMAL LOW (ref 12.0–15.0)
MCH: 25.7 pg — ABNORMAL LOW (ref 26.0–34.0)
MCH: 25.6 pg — ABNORMAL LOW (ref 26.0–34.0)
MCHC: 30.5 g/dL (ref 30.0–36.0)
MCHC: 31.1 g/dL (ref 30.0–36.0)
MCV: 84.3 fL (ref 80.0–100.0)
MCV: 82.3 fL (ref 80.0–100.0)
Platelets: 262 10*3/uL (ref 150–400)
Platelets: 243 10*3/uL (ref 150–400)
RBC: 2.61 MIL/uL — ABNORMAL LOW (ref 3.87–5.11)
RBC: 2.54 MIL/uL — ABNORMAL LOW (ref 3.87–5.11)
RDW: 19.9 % — ABNORMAL HIGH (ref 11.5–15.5)
RDW: 19.4 % — ABNORMAL HIGH (ref 11.5–15.5)
WBC: 7.2 10*3/uL (ref 4.0–10.5)
WBC: 7.5 10*3/uL (ref 4.0–10.5)
nRBC: 0.7 % — ABNORMAL HIGH (ref 0.0–0.2)
nRBC: 0.5 % — ABNORMAL HIGH (ref 0.0–0.2)

## 2022-08-09 LAB — RENAL FUNCTION PANEL
Albumin: 3.5 g/dL (ref 3.5–5.0)
Anion gap: 14 (ref 5–15)
BUN: 59 mg/dL — ABNORMAL HIGH (ref 6–20)
CO2: 28 mmol/L (ref 22–32)
Calcium: 9.4 mg/dL (ref 8.9–10.3)
Chloride: 100 mmol/L (ref 98–111)
Creatinine, Ser: 9.51 mg/dL — ABNORMAL HIGH (ref 0.44–1.00)
GFR, Estimated: 5 mL/min — ABNORMAL LOW (ref >60)
Glucose, Bld: 93 mg/dL (ref 70–99)
Phosphorus: 3.4 mg/dL (ref 2.5–4.6)
Potassium: 5.5 mmol/L — ABNORMAL HIGH (ref 3.5–5.1)
Sodium: 142 mmol/L (ref 135–145)

## 2022-08-09 LAB — HEPATITIS B SURFACE ANTIGEN: Hepatitis B Surface Ag: NONREACTIVE

## 2022-08-09 LAB — BASIC METABOLIC PANEL
Anion gap: 14 (ref 5–15)
BUN: 55 mg/dL — ABNORMAL HIGH (ref 6–20)
CO2: 28 mmol/L (ref 22–32)
Calcium: 9.2 mg/dL (ref 8.9–10.3)
Chloride: 100 mmol/L (ref 98–111)
Creatinine, Ser: 9.25 mg/dL — ABNORMAL HIGH (ref 0.44–1.00)
GFR, Estimated: 5 mL/min — ABNORMAL LOW (ref >60)
Glucose, Bld: 100 mg/dL — ABNORMAL HIGH (ref 70–99)
Potassium: 6.2 mmol/L — ABNORMAL HIGH (ref 3.5–5.1)
Sodium: 142 mmol/L (ref 135–145)

## 2022-08-09 LAB — TROPONIN I (HIGH SENSITIVITY)
Troponin I (High Sensitivity): 24 ng/L — ABNORMAL HIGH (ref <18)
Troponin I (High Sensitivity): 27 ng/L — ABNORMAL HIGH (ref <18)

## 2022-08-09 LAB — GLUCOSE, CAPILLARY
Glucose-Capillary: 88 mg/dL (ref 70–99)
Glucose-Capillary: 118 mg/dL — ABNORMAL HIGH (ref 70–99)

## 2022-08-09 LAB — SARS CORONAVIRUS 2 BY RT PCR: SARS Coronavirus 2 by RT PCR: POSITIVE — AB

## 2022-08-09 MED ORDER — AMLODIPINE BESYLATE 10 MG PO TABS
10.0000 mg | ORAL_TABLET | Freq: Every day | ORAL | Status: DC
  Administered 2022-08-10 – 2022-08-13 (×4): 10 mg via ORAL
  Filled 2022-08-09 (×5): qty 1

## 2022-08-09 MED ORDER — EZETIMIBE 10 MG PO TABS
10.0000 mg | ORAL_TABLET | Freq: Every day | ORAL | Status: DC
  Administered 2022-08-09 – 2022-08-12 (×4): 10 mg via ORAL
  Filled 2022-08-09 (×4): qty 1

## 2022-08-09 MED ORDER — ALPRAZOLAM 1 MG PO TABS
1.0000 mg | ORAL_TABLET | Freq: Three times a day (TID) | ORAL | Status: DC | PRN
  Administered 2022-08-09 – 2022-08-12 (×5): 1 mg via ORAL
  Filled 2022-08-09 (×5): qty 1

## 2022-08-09 MED ORDER — BENZONATATE 100 MG PO CAPS
200.0000 mg | ORAL_CAPSULE | Freq: Three times a day (TID) | ORAL | Status: DC | PRN
  Administered 2022-08-09 – 2022-08-13 (×5): 200 mg via ORAL
  Filled 2022-08-09 (×5): qty 2

## 2022-08-09 MED ORDER — ACETAMINOPHEN 325 MG PO TABS
650.0000 mg | ORAL_TABLET | Freq: Four times a day (QID) | ORAL | Status: DC | PRN
  Administered 2022-08-09 – 2022-08-13 (×2): 650 mg via ORAL
  Filled 2022-08-09 (×3): qty 2

## 2022-08-09 MED ORDER — SODIUM ZIRCONIUM CYCLOSILICATE 10 G PO PACK
10.0000 g | PACK | Freq: Once | ORAL | Status: DC
  Filled 2022-08-09: qty 1

## 2022-08-09 MED ORDER — ANTICOAGULANT SODIUM CITRATE 4% (200MG/5ML) IV SOLN: 5.0000 mL | Status: DC | PRN

## 2022-08-09 MED ORDER — HEPARIN SODIUM (PORCINE) 1000 UNIT/ML DIALYSIS: 1000.0000 [IU] | INTRAMUSCULAR | Status: DC | PRN

## 2022-08-09 MED ORDER — INSULIN ASPART 100 UNIT/ML IJ SOLN: 0.0000 [IU] | Freq: Three times a day (TID) | INTRAMUSCULAR | Status: DC

## 2022-08-09 MED ORDER — ALTEPLASE 2 MG IJ SOLR: 2.0000 mg | Freq: Once | INTRAMUSCULAR | Status: DC | PRN

## 2022-08-09 MED ORDER — ROPINIROLE HCL 1 MG PO TABS
1.0000 mg | ORAL_TABLET | Freq: Every day | ORAL | Status: DC
  Administered 2022-08-09 – 2022-08-12 (×4): 1 mg via ORAL
  Filled 2022-08-09 (×4): qty 1

## 2022-08-09 MED ORDER — CARVEDILOL 25 MG PO TABS
25.0000 mg | ORAL_TABLET | Freq: Two times a day (BID) | ORAL | Status: DC
  Administered 2022-08-10 – 2022-08-13 (×6): 25 mg via ORAL
  Filled 2022-08-09 (×7): qty 1

## 2022-08-09 MED ORDER — ONDANSETRON HCL 4 MG/2ML IJ SOLN
4.0000 mg | Freq: Four times a day (QID) | INTRAMUSCULAR | Status: DC | PRN
  Administered 2022-08-09 – 2022-08-11 (×4): 4 mg via INTRAVENOUS
  Filled 2022-08-09 (×4): qty 2

## 2022-08-09 MED ORDER — CARBIDOPA-LEVODOPA 25-100 MG PO TABS
1.0000 | ORAL_TABLET | Freq: Three times a day (TID) | ORAL | Status: DC
  Administered 2022-08-09 – 2022-08-13 (×11): 1 via ORAL
  Filled 2022-08-09 (×12): qty 1

## 2022-08-09 MED ORDER — GABAPENTIN 100 MG PO CAPS
100.0000 mg | ORAL_CAPSULE | Freq: Three times a day (TID) | ORAL | Status: DC
  Administered 2022-08-09 – 2022-08-13 (×11): 100 mg via ORAL
  Filled 2022-08-09 (×11): qty 1

## 2022-08-09 MED ORDER — ACETAMINOPHEN 650 MG RE SUPP: 650.0000 mg | Freq: Four times a day (QID) | RECTAL | Status: DC | PRN

## 2022-08-09 MED ORDER — ONDANSETRON HCL 4 MG PO TABS: 4.0000 mg | ORAL_TABLET | Freq: Four times a day (QID) | ORAL | Status: DC | PRN

## 2022-08-09 MED ORDER — SODIUM CHLORIDE 0.9% FLUSH
3.0000 mL | Freq: Two times a day (BID) | INTRAVENOUS | Status: DC
  Administered 2022-08-09 – 2022-08-13 (×8): 3 mL via INTRAVENOUS

## 2022-08-09 MED ORDER — HYDROMORPHONE HCL 1 MG/ML IJ SOLN
0.5000 mg | Freq: Once | INTRAMUSCULAR | Status: AC
  Administered 2022-08-09: 0.5 mg via INTRAVENOUS
  Filled 2022-08-09: qty 0.5

## 2022-08-09 MED ORDER — CHLORHEXIDINE GLUCONATE CLOTH 2 % EX PADS: 6.0000 | MEDICATED_PAD | Freq: Every day | CUTANEOUS | Status: DC

## 2022-08-09 MED ORDER — HYDROCOD POLI-CHLORPHE POLI ER 10-8 MG/5ML PO SUER
5.0000 mL | Freq: Two times a day (BID) | ORAL | Status: DC | PRN
  Administered 2022-08-09 – 2022-08-13 (×7): 5 mL via ORAL
  Filled 2022-08-09 (×8): qty 5

## 2022-08-09 MED ORDER — ALLOPURINOL 100 MG PO TABS
100.0000 mg | ORAL_TABLET | Freq: Every day | ORAL | Status: DC
  Administered 2022-08-09 – 2022-08-12 (×4): 100 mg via ORAL
  Filled 2022-08-09 (×4): qty 1

## 2022-08-09 MED ORDER — HYDRALAZINE HCL 50 MG PO TABS
50.0000 mg | ORAL_TABLET | Freq: Three times a day (TID) | ORAL | Status: DC
  Administered 2022-08-09 – 2022-08-11 (×5): 50 mg via ORAL
  Filled 2022-08-09 (×6): qty 1

## 2022-08-09 MED ORDER — DULOXETINE HCL 30 MG PO CPEP
30.0000 mg | ORAL_CAPSULE | Freq: Every day | ORAL | Status: DC
  Administered 2022-08-09 – 2022-08-13 (×5): 30 mg via ORAL
  Filled 2022-08-09 (×5): qty 1

## 2022-08-09 MED ORDER — LIDOCAINE-PRILOCAINE 2.5-2.5 % EX CREA: 1.0000 | TOPICAL_CREAM | CUTANEOUS | Status: DC | PRN

## 2022-08-09 MED ORDER — VORTIOXETINE HBR 5 MG PO TABS
10.0000 mg | ORAL_TABLET | Freq: Every day | ORAL | Status: DC
  Administered 2022-08-10 – 2022-08-13 (×4): 10 mg via ORAL
  Filled 2022-08-09 (×4): qty 2

## 2022-08-09 MED ORDER — ZOLPIDEM TARTRATE 5 MG PO TABS
5.0000 mg | ORAL_TABLET | Freq: Every day | ORAL | Status: DC
  Administered 2022-08-09 – 2022-08-12 (×4): 5 mg via ORAL
  Filled 2022-08-09 (×4): qty 1

## 2022-08-09 MED ORDER — LIDOCAINE HCL (PF) 1 % IJ SOLN: 5.0000 mL | INTRAMUSCULAR | Status: DC | PRN

## 2022-08-09 MED ORDER — PENTAFLUOROPROP-TETRAFLUOROETH EX AERO: 1.0000 | INHALATION_SPRAY | CUTANEOUS | Status: DC | PRN

## 2022-08-09 MED ORDER — HEPARIN SODIUM (PORCINE) 5000 UNIT/ML IJ SOLN
5000.0000 [IU] | Freq: Three times a day (TID) | INTRAMUSCULAR | Status: DC
  Administered 2022-08-09 – 2022-08-13 (×12): 5000 [IU] via SUBCUTANEOUS
  Filled 2022-08-09 (×12): qty 1

## 2022-08-09 MED ORDER — EZETIMIBE 10 MG PO TABS: 10.0000 mg | ORAL_TABLET | Freq: Every day | ORAL | Status: DC

## 2022-08-09 MED ORDER — ISOSORBIDE MONONITRATE ER 30 MG PO TB24
60.0000 mg | ORAL_TABLET | Freq: Every evening | ORAL | Status: DC
  Administered 2022-08-10 – 2022-08-12 (×3): 60 mg via ORAL
  Filled 2022-08-09 (×3): qty 2

## 2022-08-09 MED ORDER — HYDROXYZINE HCL 50 MG PO TABS
25.0000 mg | ORAL_TABLET | Freq: Four times a day (QID) | ORAL | Status: DC | PRN
  Administered 2022-08-09 – 2022-08-13 (×6): 25 mg via ORAL
  Filled 2022-08-09 (×6): qty 1

## 2022-08-09 NOTE — Assessment & Plan Note (Signed)
-   Continue home regimen 

## 2022-08-09 NOTE — Assessment & Plan Note (Addendum)
Continue home gabapentin ?

## 2022-08-09 NOTE — ED Notes (Signed)
Spoke with Hemodialysis RN and MD Huel Cote about Hgb 6.7. MD states will dialyze first and reassess Hgb. Relayed plan/message to Hemodialysis RN

## 2022-08-09 NOTE — Assessment & Plan Note (Signed)
In the setting of shortened dialysis recently.  - Plan for dialysis today - One-time dose of Lokelma

## 2022-08-09 NOTE — Progress Notes (Signed)
Referring Provider: No ref. provider found Primary Care Physician:  Rick Duff, PA-C Primary Nephrologist:  Dr.   Jaquita Rector for Consultation: ESRD  HPI: 47 year old obese female with a past medical history of hypertension, coronary artery disease, congestive heart failure, hyperlipidemia, end-stage renal disease on hemodialysis via right AV fistula now comes to the emergency room with worsening shortness of breath, feeling weak and tired associated with cough.  She has been on a Monday Wednesday Friday schedule for dialysis.  She had partial treatment on Friday and had some problems with the AV fistula.  She denies any fever, chest pain or headaches.  Past Medical History:  Diagnosis Date   Acid reflux    Anemia 04/2019   REQUIRING BLOOD TRANSFUSIONS   Anxiety    Arthritis    CHF (congestive heart failure) (HCC)    Dyspnea    with exertion   Dysrhythmia    tachycardia   ESRD (end stage renal disease) (HCC)    [facility] [place]   Facial twitching 11/2020   mouth twitch, eyes roll back, neurology started patient on low dose   Sinemet IR.   GI bleeding 12/2017   Gout    Headache(784.0)    "related to chemo; sometimes weekly" (09/12/2013)   Heart murmur    Systolic per Dr Orpah Cobb' notes   High cholesterol    History of blood transfusion "a couple"   "related to low counts"   Hypertension    Iron deficiency anemia    "get epogen shots q month" (02/20/2014)   MCGN (minimal change glomerulonephritis)    "using chemo to tx;  finished my last tx in 12/2013"   Neuropathy    Peritoneal dialysis status (HCC)    Stroke (HCC) 08/15/2017   ruled out that it was not a stroke    Past Surgical History:  Procedure Laterality Date   ABDOMINAL HYSTERECTOMY  2010   "laparoscopic"   ANKLE FRACTURE SURGERY Right 1994   AV FISTULA PLACEMENT Left 04/14/2016   Procedure: ARTERIOVENOUS (AV) FISTULA CREATION;  Surgeon: Chuck Hint, MD;  Location: [facility] OR;  Service: Vascular;   Laterality: Left;   AV FISTULA PLACEMENT Left 08/09/2017   Procedure: INSERTION OF ARTERIOVENOUS (AV) GORE-TEX GRAFT LEFT ARM;  Surgeon: Sherren Kerns, MD;  Location: [facility] OR;  Service: Vascular;  Laterality: Left;   AV FISTULA PLACEMENT Right 03/15/2020   Procedure: RIGHT ARM ARTERIOVENOUS (AV) FISTULA CREATION 1st Stage Basilic Transposition;  Surgeon: Nada Libman, MD;  Location: [facility] OR;  Service: Vascular;  Laterality: Right;   AV FISTULA PLACEMENT Right 02/24/2021   Procedure: RIGHT ARM ARTERIOVENOUS  FISTULA CREATION;  Surgeon: Victorino Sparrow, MD;  Location: [facility] OR;  Service: Vascular;  Laterality: Right;  regional block   AVGG REMOVAL Left 08/11/2017   Procedure: REMOVAL OF ARTERIOVENOUS GORETEX GRAFT (AVGG);  Surgeon: Nada Libman, MD;  Location: [facility] OR;  Service: Vascular;  Laterality: Left;   BASCILIC VEIN TRANSPOSITION Right 05/17/2020   Procedure: RIGHT UPPER EXTREMITY SECOND STAGE BASCILIC VEIN TRANSPOSITION;  Surgeon: Nada Libman, MD;  Location: [facility] OR;  Service: Vascular;  Laterality: Right;   BIOPSY  09/03/2017   Procedure: BIOPSY;  Surgeon: Kathi Der, MD;  Location: [facility] ENDOSCOPY;  Service: Gastroenterology;;   BIOPSY  12/24/2017   Procedure: BIOPSY;  Surgeon: Lemar Lofty., MD;  Location: [facility] ENDOSCOPY;  Service: Gastroenterology;;   CARDIAC CATHETERIZATION  2000's   COLONOSCOPY WITH PROPOFOL N/A 09/04/2017   Procedure: COLONOSCOPY WITH PROPOFOL;  Surgeon: Kerin Salen, MD;  Location: [facility] ENDOSCOPY;  Service: Gastroenterology;  Laterality: N/A;   ENTEROSCOPY N/A 12/24/2017   Procedure: ENTEROSCOPY;  Surgeon: Meridee Score Netty Starring., MD;  Location: [facility] ENDOSCOPY;  Service: Gastroenterology;  Laterality: N/A;   ESOPHAGOGASTRODUODENOSCOPY     ESOPHAGOGASTRODUODENOSCOPY (EGD) WITH PROPOFOL Left 06/04/2017   Procedure: ESOPHAGOGASTRODUODENOSCOPY (EGD) WITH PROPOFOL;  Surgeon: Willis Modena, MD;  Location: [facility] ENDOSCOPY;  Service: Endoscopy;  Laterality: Left;    ESOPHAGOGASTRODUODENOSCOPY (EGD) WITH PROPOFOL N/A 09/03/2017   Procedure: ESOPHAGOGASTRODUODENOSCOPY (EGD) WITH PROPOFOL;  Surgeon: Kathi Der, MD;  Location: [facility] ENDOSCOPY;  Service: Gastroenterology;  Laterality: N/A;   ESOPHAGOGASTRODUODENOSCOPY (EGD) WITH PROPOFOL N/A 04/27/2022   Procedure: ESOPHAGOGASTRODUODENOSCOPY (EGD) WITH PROPOFOL;  Surgeon: Regis Bill, MD;  Location: [facility] ENDOSCOPY;  Service: Endoscopy;  Laterality: N/A;   FISTULA SUPERFICIALIZATION Left 04/02/2017   Procedure: FISTULA SUPERFICIALIZATION LEFT ARM;  Surgeon: Nada Libman, MD;  Location: [facility] OR;  Service: Vascular;  Laterality: Left;   FISTULA SUPERFICIALIZATION Right 04/14/2021   Procedure: RIGHT ARM FISTULA SUPERFICIALIZATION;  Surgeon: Victorino Sparrow, MD;  Location: [facility] OR;  Service: Vascular;  Laterality: Right;  PERIPHERAL NERVE BLOCK   FISTULOGRAM Left 04/07/2016   Procedure: FISTULOGRAM;  Surgeon: Maeola Harman, MD;  Location: [facility] OR;  Service: Vascular;  Laterality: Left;   FRACTURE SURGERY     right ankle   GIVENS CAPSULE STUDY N/A 10/29/2017   Procedure: GIVENS CAPSULE STUDY;  Surgeon: Charlott Rakes, MD;  Location: [facility] ENDOSCOPY;  Service: Endoscopy;  Laterality: N/A;   GIVENS CAPSULE STUDY N/A 12/24/2017   Procedure: GIVENS CAPSULE STUDY;  Surgeon: Lemar Lofty., MD;  Location: [facility] ENDOSCOPY;  Service: Gastroenterology;  Laterality: N/A;   GIVENS CAPSULE STUDY N/A 05/22/2018   Procedure: GIVENS CAPSULE STUDY;  Surgeon: Lemar Lofty., MD;  Location: [facility] ENDOSCOPY;  Service: Gastroenterology;  Laterality: N/A;   INSERTION OF DIALYSIS CATHETER Right 04/07/2016   Procedure: INSERTION OF DIALYSIS CATHETER;  Surgeon: Maeola Harman, MD;  Location: [facility] OR;  Service: Vascular;  Laterality: Right;   INSERTION OF DIALYSIS CATHETER Right 04/04/2017   Procedure: INSERTION OF DIALYSIS CATHETER;  Surgeon: Maeola Harman, MD;  Location: [facility] OR;  Service: Vascular;   Laterality: Right;   IR FLUORO GUIDE CV LINE LEFT  03/13/2020   IR US GUIDE VASC ACCESS LEFT  03/13/2020   LEFT HEART CATH AND CORONARY ANGIOGRAPHY N/A 05/12/2019   Procedure: LEFT HEART CATH AND CORONARY ANGIOGRAPHY;  Surgeon: Orpah Cobb, MD;  [facility]: [facility] INVASIVE CV LAB;  Service: Cardiovascular;  Laterality: N/A;   LIGATION OF ARTERIOVENOUS  FISTULA Left 04/14/2016   Procedure: LIGATION OF ARTERIOVENOUS  FISTULA;  Surgeon: Chuck Hint, MD;  Location: [facility] OR;  Service: Vascular;  Laterality: Left;   LIGATION OF COMPETING BRANCHES OF ARTERIOVENOUS FISTULA Right 05/17/2020   Procedure: LIGATION OF COMPETING BRANCHES OF RIGHT ARM ARTERIOVENOUS FISTULA;  Surgeon: Nada Libman, MD;  Location: [facility] OR;  Service: Vascular;  Laterality: Right;   PATCH ANGIOPLASTY Left 06/19/2016   Procedure: PATCH ANGIOPLASTY LEFT ARTERIOVENOUS FISTULA;  Surgeon: Chuck Hint, MD;  Location: [facility] OR;  Service: Vascular;  Laterality: Left;   POLYPECTOMY  09/04/2017   Procedure: POLYPECTOMY;  Surgeon: Kerin Salen, MD;  Location: [facility] ENDOSCOPY;  Service: Gastroenterology;;   REVISON OF ARTERIOVENOUS FISTULA Left 06/19/2016   Procedure: REVISION SUPERFICIALIZATION OF BRACHIOCEPHALIC ARTERIOVENOUS FISTULA;  Surgeon: Chuck Hint, MD;  Location: [facility] OR;  Service: Vascular;  Laterality: Left;   UPPER EXTREMITY VENOGRAPHY Bilateral  02/19/2021   Procedure: UPPER EXTREMITY VENOGRAPHY;  Surgeon: Victorino Sparrow, MD;  Location: [facility] INVASIVE CV LAB;  Service: Cardiovascular;  Laterality: Bilateral;    Prior to Admission medications   Medication Sig Start Date End Date Taking? Authorizing Provider  acetaminophen (TYLENOL) 500 MG tablet Take 1,000 mg by mouth every 6 (six) hours as needed for moderate pain.    [provider]  allopurinol (ZYLOPRIM) 100 MG tablet Take 100 mg by mouth at bedtime.     [provider]  ALPRAZolam Prudy Feeler) 1 MG tablet Take 1 mg by mouth 2 (two) times daily as  needed for anxiety or sleep. 03/19/17   [provider]  amLODipine (NORVASC) 10 MG tablet Take 1 tablet (10 mg total) by mouth daily. 07/25/22   Wouk, Wilfred Curtis, MD  amoxicillin-clavulanate (AUGMENTIN) 500-125 MG tablet Take 1 tablet by mouth 3 (three) times daily. 07/24/22   Wouk, Wilfred Curtis, MD  benzonatate (TESSALON PERLES) 100 MG capsule Take 1 capsule (100 mg total) by mouth every 6 (six) hours as needed for cough. 07/24/22 07/24/23  Wouk, Wilfred Curtis, MD  carbidopa-levodopa (SINEMET IR) 25-100 MG tablet TAKE 1 TABLET BY MOUTH THREE TIMES DAILY 06/02/22   Levert Feinstein, MD  carvedilol (COREG) 25 MG tablet Take 1 tablet (25 mg total) by mouth 2 (two) times daily with a meal. 07/24/22   Wouk, Wilfred Curtis, MD  Diclofenac Sodium 1 % CREA 1 gram qid prn 05/26/21   Levert Feinstein, MD  DULoxetine (CYMBALTA) 30 MG capsule Take 1 capsule (30 mg total) by mouth daily. 05/06/22   Levert Feinstein, MD  ezetimibe (ZETIA) 10 MG tablet Take 10 mg by mouth daily. 02/21/21   [provider]  gabapentin (NEURONTIN) 100 MG capsule TAKE 3 CAPSULES(300 MG) BY MOUTH THREE TIMES DAILY 06/25/22   Levert Feinstein, MD  hydrALAZINE (APRESOLINE) 50 MG tablet Take 1 tablet (50 mg total) by mouth every 8 (eight) hours. 07/24/22   Wouk, Wilfred Curtis, MD  hydrocortisone (ANUSOL-HC) 2.5 % rectal cream Place 1 Application rectally 2 (two) times daily. 07/01/22   Pilar Jarvis, MD  hydrOXYzine (ATARAX/VISTARIL) 25 MG tablet Take 25 mg by mouth every 6 (six) hours as needed for itching. 08/16/19   [provider]  incobotulinumtoxinA (XEOMIN) 50 units SOLR injection Inject 50 Units into the muscle every 3 (three) months. Patient not taking: Reported on 07/20/2022 05/27/21   Levert Feinstein, MD  isosorbide mononitrate (IMDUR) 60 MG 24 hr tablet Take 1 tablet (60 mg total) by mouth every evening. Skip the dose if systolic BP less than 140 mmHg 05/13/22 05/13/23  Gillis Santa, MD  lanthanum (FOSRENOL) 1000 MG chewable tablet Chew 1,000-3,000  mg by mouth See admin instructions. Take 3000 mg with each meal and 1000-2000 mg with each snack 02/05/21   [provider]  lidocaine-prilocaine (EMLA) cream 1gram tid prn 05/26/21   Levert Feinstein, MD  multivitamin (RENA-VIT) TABS tablet Take 1 tablet by mouth at bedtime.     [provider]  omeprazole (PRILOSEC) 40 MG capsule Take 40 mg by mouth in the morning and at bedtime.    [provider]  ondansetron (ZOFRAN) 4 MG tablet Take 4 mg by mouth 2 (two) times daily as needed. 06/25/22   [provider]  polyethylene glycol (MIRALAX) 17 g packet Take 17 g by mouth daily. 07/01/22   Pilar Jarvis, MD  promethazine (PHENERGAN) 25 MG tablet Take 25 mg by mouth 3 (three) times daily as needed. 06/25/22  [provider]  rOPINIRole (REQUIP) 1 MG tablet Take 1 tablet (1 mg total) by mouth at bedtime. 04/29/22   Levert Feinstein, MD  vortioxetine HBr (TRINTELLIX) 10 MG TABS tablet Take 10 mg by mouth daily.    [provider]  zolpidem (AMBIEN) 5 MG tablet Take 5 mg by mouth at bedtime. 04/07/21   [provider]    No current facility-administered medications for this encounter.   Current Outpatient Medications  Medication Sig Dispense Refill   acetaminophen (TYLENOL) 500 MG tablet Take 1,000 mg by mouth every 6 (six) hours as needed for moderate pain.     allopurinol (ZYLOPRIM) 100 MG tablet Take 100 mg by mouth at bedtime.      ALPRAZolam (XANAX) 1 MG tablet Take 1 mg by mouth 2 (two) times daily as needed for anxiety or sleep.     amLODipine (NORVASC) 10 MG tablet Take 1 tablet (10 mg total) by mouth daily. 30 tablet 1   amoxicillin-clavulanate (AUGMENTIN) 500-125 MG tablet Take 1 tablet by mouth 3 (three) times daily. 1 tablet 0   benzonatate (TESSALON PERLES) 100 MG capsule Take 1 capsule (100 mg total) by mouth every 6 (six) hours as needed for cough. 30 capsule 0   carbidopa-levodopa (SINEMET IR) 25-100 MG tablet TAKE 1 TABLET BY MOUTH THREE TIMES  DAILY 90 tablet 0   carvedilol (COREG) 25 MG tablet Take 1 tablet (25 mg total) by mouth 2 (two) times daily with a meal. 60 tablet 1   Diclofenac Sodium 1 % CREA 1 gram qid prn 120 g 11   DULoxetine (CYMBALTA) 30 MG capsule Take 1 capsule (30 mg total) by mouth daily. 30 capsule 3   ezetimibe (ZETIA) 10 MG tablet Take 10 mg by mouth daily.     gabapentin (NEURONTIN) 100 MG capsule TAKE 3 CAPSULES(300 MG) BY MOUTH THREE TIMES DAILY 270 capsule 0   hydrALAZINE (APRESOLINE) 50 MG tablet Take 1 tablet (50 mg total) by mouth every 8 (eight) hours. 90 tablet 1   hydrocortisone (ANUSOL-HC) 2.5 % rectal cream Place 1 Application rectally 2 (two) times daily. 30 g 0   hydrOXYzine (ATARAX/VISTARIL) 25 MG tablet Take 25 mg by mouth every 6 (six) hours as needed for itching.     incobotulinumtoxinA (XEOMIN) 50 units SOLR injection Inject 50 Units into the muscle every 3 (three) months. (Patient not taking: Reported on 07/20/2022) 1 each 3   isosorbide mononitrate (IMDUR) 60 MG 24 hr tablet Take 1 tablet (60 mg total) by mouth every evening. Skip the dose if systolic BP less than 140 mmHg 30 tablet 11   lanthanum (FOSRENOL) 1000 MG chewable tablet Chew 1,000-3,000 mg by mouth See admin instructions. Take 3000 mg with each meal and 1000-2000 mg with each snack     lidocaine-prilocaine (EMLA) cream 1gram tid prn 30 g 11   multivitamin (RENA-VIT) TABS tablet Take 1 tablet by mouth at bedtime.      omeprazole (PRILOSEC) 40 MG capsule Take 40 mg by mouth in the morning and at bedtime.     ondansetron (ZOFRAN) 4 MG tablet Take 4 mg by mouth 2 (two) times daily as needed.     polyethylene glycol (MIRALAX) 17 g packet Take 17 g by mouth daily. 14 each 0   promethazine (PHENERGAN) 25 MG tablet Take 25 mg by mouth 3 (three) times daily as needed.     rOPINIRole (REQUIP) 1 MG tablet Take 1 tablet (1 mg total) by mouth at bedtime. 30  tablet 6   vortioxetine HBr (TRINTELLIX) 10 MG TABS tablet Take 10 mg by mouth daily.      zolpidem (AMBIEN) 5 MG tablet Take 5 mg by mouth at bedtime.      Allergies as of 08/09/2022 - Review Complete 08/09/2022  Allergen Reaction Noted   Nsaids Other (See Comments) 01/27/2013   Tolmetin Other (See Comments) 07/20/2015    Family History  Problem Relation Age of Onset   Hypertension Mother    Thyroid disease Mother    Coronary artery disease Father    Hypertension Father    Diabetes Father    Colon cancer Maternal Aunt    Esophageal cancer Maternal Uncle    Inflammatory bowel disease Neg Hx    Liver disease Neg Hx    Pancreatic cancer Neg Hx    Rectal cancer Neg Hx    Stomach cancer Neg Hx     Social History   Socioeconomic History   Marital status: Single    Spouse name: Not on file   Number of children: 3   Years of education: 14   Highest education level: Associate degree: academic program  Occupational History   Occupation: disabled  Tobacco Use   Smoking status: Never   Smokeless tobacco: Never  Vaping Use   Vaping status: Never Used  Substance and Sexual Activity   Alcohol use: No   Drug use: Never   Sexual activity: Not Currently    Birth control/protection: Surgical  Other Topics Concern   Not on file  Social History Narrative   Not on file   Social Determinants of Health   Financial Resource Strain: Low Risk  (09/04/2019)   Received from [facility], [facility]   Overall Financial Resource Strain (CARDIA)    Difficulty of Paying Living Expenses: Not hard at all  Food Insecurity: No Food Insecurity (07/20/2022)   Hunger Vital Sign    Worried About Running Out of Food in the Last Year: Never true    Ran Out of Food in the Last Year: Never true  Transportation Needs: No Transportation Needs (07/20/2022)   PRAPARE - Administrator, Civil Service (Medical): No    Lack of Transportation (Non-Medical): No  Physical Activity: Insufficiently Active (09/04/2019)   Received from [facility], [facility]   Exercise Vital  Sign    Days of Exercise per Week: 2 days    Minutes of Exercise per Session: 30 min  Stress: Stress Concern Present (09/04/2019)   Received from [facility], [facility] - Occupational Stress Questionnaire    Feeling of Stress : To some extent  Social Connections: Unknown (05/10/2021)   Received from [facility], [facility]   Social Network    Social Network: Not on file  Intimate Partner Violence: Not At Risk (07/20/2022)   Humiliation, Afraid, Rape, and Kick questionnaire    Fear of Current or Ex-Partner: No    Emotionally Abused: No    Physically Abused: No    Sexually Abused: No    Physical Exam: Vital signs in last 24 hours: Temp:  [98.1 F (36.7 C)] 98.1 F (36.7 C) (08/04 1026) Pulse Rate:  [91] 91 (08/04 1026) Resp:  [18] 18 (08/04 1026) BP: (172)/(96) 172/96 (08/04 1026) SpO2:  [97 %] 97 % (08/04 1026) Weight:  [106.6 kg] 106.6 kg (08/04 1024)   General:   Alert,  Well-developed, well-nourished, pleasant and cooperative in NAD Head:  Normocephalic and atraumatic.  Eyes:  Sclera clear, no icterus.   Conjunctiva pink. Ears:  Normal auditory acuity. Nose:  No deformity, discharge,  or lesions. Lungs:  Clear throughout to auscultation.   No wheezes, crackles, or rhonchi. No acute distress. Heart:  Regular rate and rhythm; no murmurs, clicks, rubs,  or gallops. Abdomen:  Soft, nontender and nondistended. No masses, hepatosplenomegaly or hernias noted. Normal bowel sounds, without guarding, and without rebound.   Extremities: 1+ edema.   Lab Results: Recent Labs    08/09/22 1026  WBC 7.2  HGB 6.7*  HCT 22.0*  PLT 262   BMET Recent Labs    08/09/22 1026  NA 142  K 6.2*  CL 100  CO2 28  GLUCOSE 100*  BUN 55*  CREATININE 9.25*  CALCIUM 9.2   LFT No results for input(s): "PROT", "ALBUMIN", "AST", "ALT", "ALKPHOS", "BILITOT", "BILIDIR", "IBILI" in the last 72 hours. PT/INR No results for input(s):  "LABPROT", "INR" in the last 72 hours. Hepatitis Panel No results for input(s): "HEPBSAG", "HCVAB", "HEPAIGM", "HEPBIGM" in the last 72 hours.  Studies/Results: DG Chest 2 View  Result Date: 08/09/2022 CLINICAL DATA:  Chest pain, cough, and shortness of breath. EXAM: CHEST - 2 VIEW COMPARISON:  Chest x-ray dated July 28, 2022. CT chest dated July 23, 2022. FINDINGS: Unchanged borderline cardiomegaly. New ill-defined hazy opacity in the right upper and lower lobes. Trace right pleural effusion. No pneumothorax. No acute osseous abnormality. IMPRESSION: 1. New ill-defined hazy opacity in the right upper and lower lobes, favor asymmetric pulmonary edema. 2. Trace right pleural effusion. Electronically Signed   By: Obie Dredge M.D.   On: 08/09/2022 11:17    Assessment/Plan: 47 year old obese female with a past medical history of hypertension, coronary artery disease, congestive heart failure, hyperlipidemia, end-stage renal disease on hemodialysis via right AV fistula now comes to the emergency room with worsening shortness of breath, feeling weak and tired associated with cough.  She has been on a Monday Wednesday Friday schedule for dialysis.  She had partial treatment on Friday and had some problems with the AV fistula.  She denies any fever, chest pain or headaches.  ESRD: Will dialyze her today for 3 and half hours with 3 L fluid removal as tolerated.  She will be placed on 1K bath.  ANEMIA: Anemia secondary to chronic kidney disease.  Will continue anemia protocols with iron and Epogen.  She may need to be transfused.  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  HTN/VOL: Continue present antihypertensive medications.  Hyperkalemia: Will dialyze her on low potassium bath.  Fluid overload/CHF: Patient is advised to stay on fluid restriction.  Labs and medications reviewed. Will continue to monitor closely.    LOS: 0 Lorain Childes, MD [facility] @TODAY @12 :33 PM

## 2022-08-09 NOTE — Assessment & Plan Note (Addendum)
Patient is presenting with increased shortness of breath and cough with evidence of pulmonary edema seen on chest x-ray after a shortened dialysis session yesterday.  This was due to fistula issues.  Patient also tested positive for COVID. - Continuous pulse oximetry - Supplemental oxygen as needed to maintain oxygen saturation above 88% - Hemodialysis today

## 2022-08-09 NOTE — H&P (Addendum)
History and Physical    Patient: Patricia [name] EZM:[id number] DOB: February 22, 1975 DOA: 08/09/2022 DOS: the patient was seen and examined on 08/09/2022 PCP: Rick Duff, PA-C  Patient coming from: Home  Chief Complaint:  Chief Complaint  Patient presents with   Chest Pain   Shortness of Breath   HPI: Patricia [name] is a 47 y.o. 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  [name] states that on Friday, her dialysis session was shortened due to fistula issues.  Then early this morning at approximately 1 AM, she awoke feeling very short of breath and coughing.  She is bringing up white frothy sputum that is pink occasionally.  She states she tried to get up and walk around and this made her symptoms worse.  She is endorsing bilateral chest pain that radiates around and worsened by coughing.  She endorses some lower extremity swelling, primarily in her feet.  She notes that she has been experiencing a cough since she was recently admitted for pneumonia approximately 3 weeks ago.  ED Course:  On arrival to the ED, patient was hypertensive at 172/96 with heart rate of 91.  She was saturating at 97% on room air with a respiratory rate of 18.  She was afebrile at 98.1. Initial workup notable for hemoglobin of 6.7, potassium 6.2, glucose 100, BUN 55 with GFR 5.  Troponin mildly elevated 24.  Chest x-ray was obtained that showed new ill-defined hazy opacities in the right upper and lower lobes concerning for edema.  Nephrology was consulted with plans for emergent dialysis today.  TRH contacted for admission.    Review of Systems: As mentioned in the history of present illness. All other systems reviewed and are negative.  Past Medical History:  Diagnosis Date   Acid reflux    Anemia 04/2019    REQUIRING BLOOD TRANSFUSIONS   Anxiety    Arthritis    CHF (congestive heart failure) (HCC)    Dyspnea    with exertion   Dysrhythmia    tachycardia   ESRD (end stage renal disease) (HCC)    [facility] [place]   Facial twitching 11/2020   mouth twitch, eyes roll back, neurology started patient on low dose   Sinemet IR.   GI bleeding 12/2017   Gout    Headache(784.0)    "related to chemo; sometimes weekly" (09/12/2013)   Heart murmur    Systolic per Dr Orpah Cobb' notes   High cholesterol    History of blood transfusion "a couple"   "related to low counts"   Hypertension    Iron deficiency anemia    "get epogen shots q month" (02/20/2014)   MCGN (minimal change glomerulonephritis)    "using chemo to tx;  finished my last tx in 12/2013"   Neuropathy    Peritoneal dialysis status (HCC)    Stroke (HCC) 08/15/2017   ruled out that it was not a stroke   Past Surgical History:  Procedure Laterality Date   ABDOMINAL HYSTERECTOMY  2010   "laparoscopic"   ANKLE FRACTURE SURGERY Right 1994   AV FISTULA PLACEMENT Left 04/14/2016   Procedure: ARTERIOVENOUS (AV) FISTULA CREATION;  Surgeon: Chuck Hint, MD;  Location: [facility] OR;  Service: Vascular;  Laterality: Left;   AV FISTULA PLACEMENT Left 08/09/2017   Procedure: INSERTION OF ARTERIOVENOUS (AV)  GORE-TEX GRAFT LEFT ARM;  Surgeon: Sherren Kerns, MD;  Location: [facility] OR;  Service: Vascular;  Laterality: Left;   AV FISTULA PLACEMENT Right 03/15/2020   Procedure: RIGHT ARM ARTERIOVENOUS (AV) FISTULA CREATION 1st Stage Basilic Transposition;  Surgeon: Nada Libman, MD;  Location: [facility] OR;  Service: Vascular;  Laterality: Right;   AV FISTULA PLACEMENT Right 02/24/2021   Procedure: RIGHT ARM ARTERIOVENOUS  FISTULA CREATION;  Surgeon: Victorino Sparrow, MD;  Location: [facility] OR;  Service: Vascular;  Laterality: Right;  regional block   AVGG REMOVAL Left 08/11/2017   Procedure: REMOVAL OF ARTERIOVENOUS GORETEX GRAFT (AVGG);  Surgeon: Nada Libman, MD;  Location: [facility] OR;  Service: Vascular;  Laterality: Left;   BASCILIC VEIN TRANSPOSITION Right 05/17/2020   Procedure: RIGHT UPPER EXTREMITY SECOND STAGE BASCILIC VEIN TRANSPOSITION;  Surgeon: Nada Libman, MD;  Location: [facility] OR;  Service: Vascular;  Laterality: Right;   BIOPSY  09/03/2017   Procedure: BIOPSY;  Surgeon: Kathi Der, MD;  Location: [facility] ENDOSCOPY;  Service: Gastroenterology;;   BIOPSY  12/24/2017   Procedure: BIOPSY;  Surgeon: Lemar Lofty., MD;  Location: [facility] ENDOSCOPY;  Service: Gastroenterology;;   CARDIAC CATHETERIZATION  2000's   COLONOSCOPY WITH PROPOFOL N/A 09/04/2017   Procedure: COLONOSCOPY WITH PROPOFOL;  Surgeon: Kerin Salen, MD;  Location: [facility] ENDOSCOPY;  Service: Gastroenterology;  Laterality: N/A;   ENTEROSCOPY N/A 12/24/2017   Procedure: ENTEROSCOPY;  Surgeon: Meridee Score Netty Starring., MD;  Location: [facility] ENDOSCOPY;  Service: Gastroenterology;  Laterality: N/A;   ESOPHAGOGASTRODUODENOSCOPY     ESOPHAGOGASTRODUODENOSCOPY (EGD) WITH PROPOFOL Left 06/04/2017   Procedure: ESOPHAGOGASTRODUODENOSCOPY (EGD) WITH PROPOFOL;  Surgeon: Willis Modena, MD;  Location: [facility] ENDOSCOPY;  Service: Endoscopy;  Laterality: Left;   ESOPHAGOGASTRODUODENOSCOPY (EGD) WITH PROPOFOL N/A 09/03/2017   Procedure: ESOPHAGOGASTRODUODENOSCOPY (EGD) WITH PROPOFOL;  Surgeon: Kathi Der, MD;  Location: [facility] ENDOSCOPY;  Service: Gastroenterology;  Laterality: N/A;   ESOPHAGOGASTRODUODENOSCOPY (EGD) WITH PROPOFOL N/A 04/27/2022   Procedure: ESOPHAGOGASTRODUODENOSCOPY (EGD) WITH PROPOFOL;  Surgeon: Regis Bill, MD;  Location: [facility] ENDOSCOPY;  Service: Endoscopy;  Laterality: N/A;   FISTULA SUPERFICIALIZATION Left 04/02/2017   Procedure: FISTULA SUPERFICIALIZATION LEFT ARM;  Surgeon: Nada Libman, MD;  Location: [facility] OR;  Service: Vascular;  Laterality: Left;   FISTULA SUPERFICIALIZATION Right 04/14/2021   Procedure: RIGHT ARM FISTULA SUPERFICIALIZATION;  Surgeon: Victorino Sparrow, MD;  Location: [facility] OR;  Service: Vascular;  Laterality: Right;  PERIPHERAL NERVE BLOCK   FISTULOGRAM Left 04/07/2016   Procedure: FISTULOGRAM;  Surgeon: Maeola Harman, MD;  Location: [facility] OR;  Service: Vascular;  Laterality: Left;   FRACTURE SURGERY     right ankle   GIVENS CAPSULE STUDY N/A 10/29/2017   Procedure: GIVENS CAPSULE STUDY;  Surgeon: Charlott Rakes, MD;  Location: [facility] ENDOSCOPY;  Service: Endoscopy;  Laterality: N/A;   GIVENS CAPSULE STUDY N/A 12/24/2017   Procedure: GIVENS CAPSULE STUDY;  Surgeon: Lemar Lofty., MD;  Location: [facility] ENDOSCOPY;  Service: Gastroenterology;  Laterality: N/A;   GIVENS CAPSULE STUDY N/A 05/22/2018   Procedure: GIVENS CAPSULE STUDY;  Surgeon: Lemar Lofty., MD;  Location: [facility] ENDOSCOPY;  Service: Gastroenterology;  Laterality: N/A;   INSERTION OF DIALYSIS CATHETER Right 04/07/2016   Procedure: INSERTION OF DIALYSIS CATHETER;  Surgeon: Maeola Harman, MD;  Location: [facility] OR;  Service: Vascular;  Laterality: Right;   INSERTION OF DIALYSIS CATHETER Right 04/04/2017   Procedure: INSERTION OF DIALYSIS CATHETER;  Surgeon: Maeola Harman, MD;  Location: [facility] OR;  Service: Vascular;  Laterality:  Right;   IR FLUORO GUIDE CV LINE LEFT  03/13/2020   IR US GUIDE VASC ACCESS LEFT  03/13/2020   LEFT HEART CATH AND CORONARY ANGIOGRAPHY N/A 05/12/2019   Procedure: LEFT HEART CATH AND CORONARY ANGIOGRAPHY;  Surgeon: Orpah Cobb, MD;  Location: [facility] INVASIVE CV LAB;  Service: Cardiovascular;  Laterality: N/A;   LIGATION OF ARTERIOVENOUS  FISTULA Left 04/14/2016   Procedure: LIGATION OF ARTERIOVENOUS  FISTULA;  Surgeon: Chuck Hint, MD;  Location: [facility] OR;  Service: Vascular;  Laterality: Left;   LIGATION OF COMPETING BRANCHES OF ARTERIOVENOUS FISTULA Right 05/17/2020   Procedure: LIGATION OF COMPETING BRANCHES OF RIGHT ARM ARTERIOVENOUS FISTULA;  Surgeon: Nada Libman, MD;  Location: [facility] OR;  Service: Vascular;   Laterality: Right;   PATCH ANGIOPLASTY Left 06/19/2016   Procedure: PATCH ANGIOPLASTY LEFT ARTERIOVENOUS FISTULA;  Surgeon: Chuck Hint, MD;  Location: [facility] OR;  Service: Vascular;  Laterality: Left;   POLYPECTOMY  09/04/2017   Procedure: POLYPECTOMY;  Surgeon: Kerin Salen, MD;  Location: [facility] ENDOSCOPY;  Service: Gastroenterology;;   REVISON OF ARTERIOVENOUS FISTULA Left 06/19/2016   Procedure: REVISION SUPERFICIALIZATION OF BRACHIOCEPHALIC ARTERIOVENOUS FISTULA;  Surgeon: Chuck Hint, MD;  Location: [facility] OR;  Service: Vascular;  Laterality: Left;   UPPER EXTREMITY VENOGRAPHY Bilateral 02/19/2021   Procedure: UPPER EXTREMITY VENOGRAPHY;  Surgeon: Victorino Sparrow, MD;  Location: [facility] INVASIVE CV LAB;  Service: Cardiovascular;  Laterality: Bilateral;   Social History:  reports that she has never smoked. She has never used smokeless tobacco. She reports that she does not drink alcohol and does not use drugs.  Allergies  Allergen Reactions   Nsaids Other (See Comments)    Cannot take due to Kidney disease/Kidney function  Other Reaction(s): Unknown   Tolmetin Other (See Comments)    Cannot take due to Kidney Disease    Family History  Problem Relation Age of Onset   Hypertension Mother    Thyroid disease Mother    Coronary artery disease Father    Hypertension Father    Diabetes Father    Colon cancer Maternal Aunt    Esophageal cancer Maternal Uncle    Inflammatory bowel disease Neg Hx    Liver disease Neg Hx    Pancreatic cancer Neg Hx    Rectal cancer Neg Hx    Stomach cancer Neg Hx     Prior to Admission medications   Medication Sig Start Date End Date Taking? Authorizing Provider  acetaminophen (TYLENOL) 500 MG tablet Take 1,000 mg by mouth every 6 (six) hours as needed for moderate pain.    [provider]  allopurinol (ZYLOPRIM) 100 MG tablet Take 100 mg by mouth at bedtime.     [provider]  ALPRAZolam Prudy Feeler) 1 MG tablet Take 1 mg by  mouth 2 (two) times daily as needed for anxiety or sleep. 03/19/17   [provider]  amLODipine (NORVASC) 10 MG tablet Take 1 tablet (10 mg total) by mouth daily. 07/25/22   Wouk, Wilfred Curtis, MD  amoxicillin-clavulanate (AUGMENTIN) 500-125 MG tablet Take 1 tablet by mouth 3 (three) times daily. 07/24/22   Wouk, Wilfred Curtis, MD  benzonatate (TESSALON PERLES) 100 MG capsule Take 1 capsule (100 mg total) by mouth every 6 (six) hours as needed for cough. 07/24/22 07/24/23  Kathrynn Running, MD  carbidopa-levodopa (SINEMET IR) 25-100 MG tablet TAKE 1 TABLET BY MOUTH THREE TIMES DAILY 06/02/22   Levert Feinstein, MD  carvedilol (COREG) 25 MG tablet Take 1 tablet (  25 mg total) by mouth 2 (two) times daily with a meal. 07/24/22   Wouk, Wilfred Curtis, MD  Diclofenac Sodium 1 % CREA 1 gram qid prn 05/26/21   Levert Feinstein, MD  DULoxetine (CYMBALTA) 30 MG capsule Take 1 capsule (30 mg total) by mouth daily. 05/06/22   Levert Feinstein, MD  ezetimibe (ZETIA) 10 MG tablet Take 10 mg by mouth daily. 02/21/21   [provider]  gabapentin (NEURONTIN) 100 MG capsule TAKE 3 CAPSULES(300 MG) BY MOUTH THREE TIMES DAILY 06/25/22   Levert Feinstein, MD  hydrALAZINE (APRESOLINE) 50 MG tablet Take 1 tablet (50 mg total) by mouth every 8 (eight) hours. 07/24/22   Wouk, Wilfred Curtis, MD  hydrocortisone (ANUSOL-HC) 2.5 % rectal cream Place 1 Application rectally 2 (two) times daily. 07/01/22   Pilar Jarvis, MD  hydrOXYzine (ATARAX/VISTARIL) 25 MG tablet Take 25 mg by mouth every 6 (six) hours as needed for itching. 08/16/19   [provider]  incobotulinumtoxinA (XEOMIN) 50 units SOLR injection Inject 50 Units into the muscle every 3 (three) months. Patient not taking: Reported on 07/20/2022 05/27/21   Levert Feinstein, MD  isosorbide mononitrate (IMDUR) 60 MG 24 hr tablet Take 1 tablet (60 mg total) by mouth every evening. Skip the dose if systolic BP less than 140 mmHg 05/13/22 05/13/23  Gillis Santa, MD  lanthanum (FOSRENOL) 1000 MG  chewable tablet Chew 1,000-3,000 mg by mouth See admin instructions. Take 3000 mg with each meal and 1000-2000 mg with each snack 02/05/21   [provider]  lidocaine-prilocaine (EMLA) cream 1gram tid prn 05/26/21   Levert Feinstein, MD  multivitamin (RENA-VIT) TABS tablet Take 1 tablet by mouth at bedtime.     [provider]  omeprazole (PRILOSEC) 40 MG capsule Take 40 mg by mouth in the morning and at bedtime.    [provider]  ondansetron (ZOFRAN) 4 MG tablet Take 4 mg by mouth 2 (two) times daily as needed. 06/25/22   [provider]  polyethylene glycol (MIRALAX) 17 g packet Take 17 g by mouth daily. 07/01/22   Pilar Jarvis, MD  promethazine (PHENERGAN) 25 MG tablet Take 25 mg by mouth 3 (three) times daily as needed. 06/25/22   [provider]  rOPINIRole (REQUIP) 1 MG tablet Take 1 tablet (1 mg total) by mouth at bedtime. 04/29/22   Levert Feinstein, MD  vortioxetine HBr (TRINTELLIX) 10 MG TABS tablet Take 10 mg by mouth daily.    [provider]  zolpidem (AMBIEN) 5 MG tablet Take 5 mg by mouth at bedtime. 04/07/21   [provider]    Physical Exam: Vitals:   08/09/22 1024 08/09/22 1026  BP:  (!) 172/96  Pulse:  91  Resp:  18  Temp:  98.1 F (36.7 C)  TempSrc:  Oral  SpO2:  97%  Weight: 106.6 kg   Height: 5\' 7"  (1.702 m)    Physical Exam Vitals and nursing note reviewed.  Constitutional:      General: She is in acute distress (resp).     Appearance: She is obese.  HENT:     Head: Normocephalic and atraumatic.  Cardiovascular:     Rate and Rhythm: Normal rate and regular rhythm.  Pulmonary:     Effort: Tachypnea and respiratory distress (mild) present.     Breath sounds: Wheezing (primarily right sided) present. No decreased breath sounds, rhonchi or rales.  Musculoskeletal:     Right lower leg: No edema.     Left lower  leg: No edema.  Skin:    General: Skin is warm and dry.  Neurological:     General: No focal deficit  present.     Mental Status: She is alert and oriented to person, place, and time.  Psychiatric:        Mood and Affect: Mood normal.        Behavior: Behavior normal.    Data Reviewed: CBC with WBC 7.2, hemoglobin 6.7, MCV of 84 and platelets of 262 BMP with sodium 142, potassium 6.2, bicarb 28, BUN 55, creatinine 9.25 with GFR 5 Troponin 24  EKG appears reviewed.  Sinus rhythm with rate of 90.  No ST or T wave changes concerning for ischemia.  No peaked T waves.  DG Chest 2 View  Result Date: 08/09/2022 CLINICAL DATA:  Chest pain, cough, and shortness of breath. EXAM: CHEST - 2 VIEW COMPARISON:  Chest x-ray dated July 28, 2022. CT chest dated July 23, 2022. FINDINGS: Unchanged borderline cardiomegaly. New ill-defined hazy opacity in the right upper and lower lobes. Trace right pleural effusion. No pneumothorax. No acute osseous abnormality. IMPRESSION: 1. New ill-defined hazy opacity in the right upper and lower lobes, favor asymmetric pulmonary edema. 2. Trace right pleural effusion. Electronically Signed   By: Obie Dredge M.D.   On: 08/09/2022 11:17    There are no new results to review at this time.  Assessment and Plan:  * Acute pulmonary edema (HCC) Patient is presenting with increased shortness of breath and cough with evidence of pulmonary edema seen on chest x-ray after a shortened dialysis session yesterday.  This was due to fistula issues.  Given severity of cough, will also test for COVID-19.  - Continuous pulse oximetry - Supplemental oxygen as needed to maintain oxygen saturation above 88% - Emergent dialysis planned for today - COVID-19 PCR pending - Tussionex as needed for cough  ESRD on dialysis [facility]) Patient's dialysis session yesterday was shortened causing pulmonary edema today.  - Nephrology consulted; appreciate their recommendations - Plan for dialysis today  Anemia due to chronic kidney disease Hemoglobin today 6.7 compared to her baseline around 8.   Given she is hypervolemic, I question hemodilution and so we will hold off on transfusion for now.  - Repeat CBC in the a.m.  Hypertension - Continue home regimen  Type 2 diabetes mellitus with other diabetic kidney complication (HCC) - SSI very sensitive  Hyperkalemia In the setting of shortened dialysis recently.  - Plan for dialysis today - One-time dose of Lokelma  Idiopathic peripheral neuropathy - Continue home gabapentin  Facial twitching Patient receives intermittent Xeo injections and is on Sinemet for facial twitching with dystonia.  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  - Continue home Sinemet  Advance Care Planning:   Code Status: Full Code   Consults: Nephrology  Family Communication: No family at bedside  Severity of Illness: The appropriate patient status for this patient is OBSERVATION. Observation status is judged to be reasonable and necessary in order to provide the required intensity of service to ensure the patient's safety. The patient's presenting symptoms, physical exam findings, and initial radiographic and laboratory data in the context of their medical condition is felt to place them at decreased risk for further clinical deterioration. Furthermore, it is anticipated that the patient will be medically stable for discharge from the hospital within 2 midnights of admission.   Author: Verdene Lennert, MD 08/09/2022 1:28 PM  For on call review [email or web address].

## 2022-08-09 NOTE — ED Triage Notes (Signed)
Pt via POV from home. Pt c/o mid-chest pain, SOB, and cough started this AM. Pt is dialysis pt on MWF, unable to finish treatment on Friday due to fistula issues, pt had 2 hours left. Pt states she is recovering from PNA. Pt is A&OX4 and NAD

## 2022-08-09 NOTE — Assessment & Plan Note (Addendum)
Hemoglobin today 6.7 compared to her baseline around 8.  Given she is hypervolemic, I question hemodilution and so we will hold off on transfusion for now.  - Repeat CBC with hemoglobin of 7.9 -Continue to monitor

## 2022-08-09 NOTE — Assessment & Plan Note (Signed)
Patient's dialysis session yesterday was shortened causing pulmonary edema today.  - Nephrology consulted; appreciate their recommendations - Plan for dialysis today

## 2022-08-09 NOTE — ED Provider Notes (Signed)
[facility] Provider Note    Event Date/Time   First MD Initiated Contact with Patient 08/09/22 1146     (approximate)   History   Chest Pain and Shortness of Breath   HPI  Patricia [name] is a 47 y.o. female who presents to the ER for evaluation of shortness of breath worsening over the past 24 hours.  She is dialysis patient Monday Wednesday Friday.  There is some sort of issue with the fistula on Friday and she did not get much of her dialysis session completed.  He was recently treated for pneumonia she is having persistent cough and significant exertional dyspnea to the point where she is only able to take a few steps now.     Physical Exam   Triage Vital Signs: ED Triage Vitals  Encounter Vitals Group     BP 08/09/22 1026 (!) 172/96     Systolic BP Percentile --      Diastolic BP Percentile --      Pulse Rate 08/09/22 1026 91     Resp 08/09/22 1026 18     Temp 08/09/22 1026 98.1 F (36.7 C)     Temp Source 08/09/22 1026 Oral     SpO2 08/09/22 1026 97 %     Weight 08/09/22 1024 235 lb (106.6 kg)     Height 08/09/22 1024 5\' 7"  (1.702 m)     Head Circumference --      Peak Flow --      Pain Score 08/09/22 1024 6     Pain Loc --      Pain Education --      Exclude from Growth Chart --     Most recent vital signs: Vitals:   08/09/22 1026  BP: (!) 172/96  Pulse: 91  Resp: 18  Temp: 98.1 F (36.7 C)  SpO2: 97%     Constitutional: Alert  Eyes: Conjunctivae are normal.  Head: Atraumatic. Nose: No congestion/rhinnorhea. Mouth/Throat: Mucous membranes are moist.   Neck: Painless ROM.  Cardiovascular:   Good peripheral circulation. Respiratory: Mild tachypnea. Gastrointestinal: Soft and nontender.  Musculoskeletal:  no deformity Neurologic:  MAE spontaneously. No gross focal neurologic deficits are appreciated.  Skin:  Skin is warm, dry and intact. No rash noted. Psychiatric: Mood and affect are normal. Speech and  behavior are normal.    ED Results / Procedures / Treatments   Labs (all labs ordered are listed, but only abnormal results are displayed) Labs Reviewed  BASIC METABOLIC PANEL - Abnormal; Notable for the following components:      Result Value   Potassium 6.2 (*)    Glucose, Bld 100 (*)    BUN 55 (*)    Creatinine, Ser 9.25 (*)    GFR, Estimated 5 (*)    All other components within normal limits  CBC - Abnormal; Notable for the following components:   RBC 2.61 (*)    Hemoglobin 6.7 (*)    HCT 22.0 (*)    MCH 25.7 (*)    RDW 19.9 (*)    nRBC 0.7 (*)    All other components within normal limits  TROPONIN I (HIGH SENSITIVITY) - Abnormal; Notable for the following components:   Troponin I (High Sensitivity) 24 (*)    All other components within normal limits  TROPONIN I (HIGH SENSITIVITY)     EKG  ED ECG REPORT I, Willy Eddy, the attending physician, personally viewed and interpreted this ECG.   Date: 08/09/2022  EKG Time: 10:26  Rate: 90  Rhythm: sinus  Axis: normal  Intervals: normal  ST&T Change: no stemi    RADIOLOGY Please see ED Course for my review and interpretation.  I personally reviewed all radiographic images ordered to evaluate for the above acute complaints and reviewed radiology reports and findings.  These findings were personally discussed with the patient.  Please see medical record for radiology report.    PROCEDURES:  Critical Care performed: No  Procedures   MEDICATIONS ORDERED IN ED: Medications - No data to display   IMPRESSION / MDM / ASSESSMENT AND PLAN / ED COURSE  I reviewed the triage vital signs and the nursing notes.                              Differential diagnosis includes, but is not limited to, Asthma, copd, CHF, pna, ptx, malignancy, Pe, anemia   Patient presenting to the ER for evaluation of symptoms as described above.  Based on symptoms, risk factors and considered above differential, this presenting  complaint could reflect a potentially life-threatening illness therefore the patient will be placed on continuous pulse oximetry and telemetry for monitoring.  Laboratory evaluation will be sent to evaluate for the above complaints.  Patient is tachypneic with chest x-ray on my review and interpretation with cardiomegaly exam is consistent with edema.  Potassium is mildly elevated at 6.2.  No sign of hyperacute T waves or EKG changes at this time.  I discussed the case in consultation with nephrology to arrange for urgent dialysis.  Will consult hospitalist for admission.       FINAL CLINICAL IMPRESSION(S) / ED DIAGNOSES   Final diagnoses:  Dyspnea, unspecified type  Acute pulmonary edema (HCC)  ESRD (end stage renal disease) (HCC)     Rx / DC Orders   ED Discharge Orders     None        Note:  This document was prepared using Dragon voice recognition software and may include unintentional dictation errors.    Willy Eddy, MD 08/09/22 [phone]

## 2022-08-09 NOTE — Progress Notes (Signed)
   Informed consent signed and in chart.    TX duration: 3.25 hrs     Hand-off given to patient's nurse.    Access used: R UA AVF Access issues: High Venous pressure, couldn't run higher than 300 BFR, pt complained of pain in venous needle site with higher BFR   Total UF removed: 3000 mL Medication(s) given: none Post HD VS: 98.1 T, 197/93 BP, 86 P, 96% sPO2%, 20 RR Post HD weight: 109.1 kg     K[facility] Dialysis Unit

## 2022-08-09 NOTE — Plan of Care (Signed)
  Problem: Coping: Goal: Ability to adjust to condition or change in health will improve Outcome: Progressing   Problem: Metabolic: Goal: Ability to maintain appropriate glucose levels will improve Outcome: Progressing   Problem: Nutritional: Goal: Maintenance of adequate nutrition will improve Outcome: Progressing   Problem: Tissue Perfusion: Goal: Adequacy of tissue perfusion will improve Outcome: Progressing   Problem: Education: Goal: Knowledge of General Education information will improve Description: Including pain rating scale, medication(s)/side effects and non-pharmacologic comfort measures Outcome: Progressing   Problem: Activity: Goal: Risk for activity intolerance will decrease Outcome: Progressing   Problem: Coping: Goal: Level of anxiety will decrease Outcome: Progressing   Problem: Elimination: Goal: Will not experience complications related to bowel motility Outcome: Progressing   Problem: Pain Managment: Goal: General experience of comfort will improve Outcome: Progressing   Problem: Safety: Goal: Ability to remain free from injury will improve Outcome: Progressing   Problem: Skin Integrity: Goal: Risk for impaired skin integrity will decrease Outcome: Progressing

## 2022-08-09 NOTE — Assessment & Plan Note (Signed)
Patient receives intermittent Xeo injections and is on Sinemet for facial twitching with dystonia.  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  - Continue home Sinemet

## 2022-08-09 NOTE — Assessment & Plan Note (Signed)
-   SSI very sensitive

## 2022-08-09 NOTE — ED Notes (Signed)
Advised nurse that patient has ready bed 

## 2022-08-10 DIAGNOSIS — R0600 Dyspnea, unspecified: Secondary | ICD-10-CM | POA: Diagnosis not present

## 2022-08-10 DIAGNOSIS — U071 COVID-19: Secondary | ICD-10-CM | POA: Diagnosis present

## 2022-08-10 DIAGNOSIS — J1282 Pneumonia due to coronavirus disease 2019: Secondary | ICD-10-CM | POA: Diagnosis present

## 2022-08-10 DIAGNOSIS — J810 Acute pulmonary edema: Secondary | ICD-10-CM | POA: Diagnosis not present

## 2022-08-10 DIAGNOSIS — G514 Facial myokymia: Secondary | ICD-10-CM

## 2022-08-10 DIAGNOSIS — N186 End stage renal disease: Secondary | ICD-10-CM | POA: Diagnosis not present

## 2022-08-10 LAB — CBC
HCT: 24.6 % — ABNORMAL LOW (ref 36.0–46.0)
Hemoglobin: 7.9 g/dL — ABNORMAL LOW (ref 12.0–15.0)
MCH: 25.7 pg — ABNORMAL LOW (ref 26.0–34.0)
MCHC: 32.1 g/dL (ref 30.0–36.0)
MCV: 80.1 fL (ref 80.0–100.0)
Platelets: 253 10*3/uL (ref 150–400)
RBC: 3.07 MIL/uL — ABNORMAL LOW (ref 3.87–5.11)
RDW: 19.1 % — ABNORMAL HIGH (ref 11.5–15.5)
WBC: 8.2 10*3/uL (ref 4.0–10.5)
nRBC: 0.5 % — ABNORMAL HIGH (ref 0.0–0.2)

## 2022-08-10 LAB — RENAL FUNCTION PANEL
Albumin: 3.2 g/dL — ABNORMAL LOW (ref 3.5–5.0)
Anion gap: 12 (ref 5–15)
BUN: 38 mg/dL — ABNORMAL HIGH (ref 6–20)
CO2: 28 mmol/L (ref 22–32)
Calcium: 9.2 mg/dL (ref 8.9–10.3)
Chloride: 94 mmol/L — ABNORMAL LOW (ref 98–111)
Creatinine, Ser: 7.17 mg/dL — ABNORMAL HIGH (ref 0.44–1.00)
GFR, Estimated: 6 mL/min — ABNORMAL LOW (ref >60)
Glucose, Bld: 98 mg/dL (ref 70–99)
Phosphorus: 4.4 mg/dL (ref 2.5–4.6)
Potassium: 5.4 mmol/L — ABNORMAL HIGH (ref 3.5–5.1)
Sodium: 135 mmol/L (ref 135–145)

## 2022-08-10 LAB — GLUCOSE, CAPILLARY
Glucose-Capillary: 97 mg/dL (ref 70–99)
Glucose-Capillary: 92 mg/dL (ref 70–99)

## 2022-08-10 MED ORDER — PENTAFLUOROPROP-TETRAFLUOROETH EX AERO
INHALATION_SPRAY | CUTANEOUS | Status: AC
  Filled 2022-08-10: qty 30

## 2022-08-10 MED ORDER — VITAMIN C 500 MG PO TABS
500.0000 mg | ORAL_TABLET | Freq: Two times a day (BID) | ORAL | Status: DC
  Administered 2022-08-10 – 2022-08-13 (×6): 500 mg via ORAL
  Filled 2022-08-10 (×6): qty 1

## 2022-08-10 MED ORDER — ZINC SULFATE 220 (50 ZN) MG PO CAPS
220.0000 mg | ORAL_CAPSULE | Freq: Every day | ORAL | Status: DC
  Administered 2022-08-10 – 2022-08-13 (×4): 220 mg via ORAL
  Filled 2022-08-10 (×4): qty 1

## 2022-08-10 MED ORDER — HYDRALAZINE HCL 20 MG/ML IJ SOLN
10.0000 mg | Freq: Once | INTRAMUSCULAR | Status: AC
  Administered 2022-08-10: 10 mg via INTRAVENOUS
  Filled 2022-08-10: qty 0.5

## 2022-08-10 NOTE — Assessment & Plan Note (Signed)
COVID PCR came back positive.  We will repeat chest x-ray tomorrow after dialysis to differentiate between pulmonary edema secondary to shortened dialysis versus COVID-19 pneumonia. Currently on room air. -Continue with supportive care -Check procalcitonin

## 2022-08-10 NOTE — Progress Notes (Signed)
Pt refusing to wear Tele, pt educated on importance of Telemetry, pt still refusing.  Tele informed.

## 2022-08-10 NOTE — Progress Notes (Signed)
Informed Dr, Nelson Chimes that pt did not receive Coreg 25mg  and Imdur 60mg  oral was due after pt came back from HD, current BP 172/88.

## 2022-08-10 NOTE — Hospital Course (Addendum)
Taken from H&P.  Patricia [name] is a 47 y.o. 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  On Friday her dialysis session was shortened due to fistula issues.  On arrival to ED patient was hypertensive at 172/96, pertinent labs with hemoglobin of 6.7, potassium 6.2, mildly positive troponin at 24. Chest x-ray showed new ill-defined hazy opacities in the right upper and lower lobes concerning for edema. Nephrology was consulted.  8/5: Blood pressure elevated at 198/94.  COVID-19 PCR came back positive.  Going for dialysis today.  Potassium at 5.4.  8/6: Vital stable on room air, blood pressure remained elevated.  Increasing the dose of hydralazine to 75 mg 3 times daily, potassium 4.5.  Procalcitonin at 0.24.  Started on Levaquin. Repeat chest x-ray with persistent of opacities, likely COVID-19 pneumonia. Continue to have generalized malaise and does not think that she can go home today.  8/7: Continue to complain of nausea.  Started on 2 L of oxygen overnight.  Able to wean back to room air now.  Patient is stable for discharge after dialysis but her dialysis session will be the last for today due to positive COVID status.   8/8: Patient remained stable.  On room air.  Continue to have cough.  She is being discharged on 2 more doses of levofloxacin and Tussionex prescription was also given with instructions to mostly use at night.  Patient will get benefit from repeat chest x-ray in 3 to 4 weeks which can be done by her primary care provider.  She will continue her routine dialysis and rest of her home medications.  Her home hydralazine dose was increased to 75 mg 3 times a day as blood pressure remained elevated on 50 mg 3 times a day which was her prior dose.  Patient will continue on current medications and  need to have a close follow-up with her providers for further recommendations.

## 2022-08-10 NOTE — Progress Notes (Signed)
  Received patient in bed to unit.   Informed consent signed and in chart.    TX duration: 3.25 hrs     Transported back to floor  Hand-off given to patient's nurse.  Pt with no c/o and no distress noted    Access used: R AVF  Access issues: none   Total UF removed: 0.5L Medication(s) given: 10mg  hydralazine Post HD VS: 190/94 Post HD weight: 106.4kg     Lynann Beaver  [facility]

## 2022-08-10 NOTE — Progress Notes (Signed)
Progress Note   Patient: Patricia [name] DOB: 1975-11-02 DOA: 08/09/2022     0 DOS: the patient was seen and examined on 08/10/2022   Brief hospital course: Taken from H&P.  Patricia [name] is a 47 y.o. 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  On Friday her dialysis session was shortened due to fistula issues.  On arrival to ED patient was hypertensive at 172/96, pertinent labs with hemoglobin of 6.7, potassium 6.2, mildly positive troponin at 24. Chest x-ray showed new ill-defined hazy opacities in the right upper and lower lobes concerning for edema. Nephrology was consulted.  8/5: Blood pressure elevated at 198/94.  COVID-19 PCR came back positive.  Going for dialysis today.  Potassium at 5.4.   Assessment and Plan: * Acute pulmonary edema (HCC) Patient is presenting with increased shortness of breath and cough with evidence of pulmonary edema seen on chest x-ray after a shortened dialysis session yesterday.  This was due to fistula issues.  Patient also tested positive for COVID. - Continuous pulse oximetry - Supplemental oxygen as needed to maintain oxygen saturation above 88% - Hemodialysis today   COVID-19 virus infection COVID PCR came back positive.  We will repeat chest x-ray tomorrow after dialysis to differentiate between pulmonary edema secondary to shortened dialysis versus COVID-19 pneumonia. Currently on room air. -Continue with supportive care -Check procalcitonin  ESRD on dialysis [facility]) Patient's dialysis session yesterday was shortened causing pulmonary edema today.  - Nephrology consulted; appreciate their recommendations - Plan for dialysis today  Anemia due to chronic kidney disease Hemoglobin today 6.7 compared to her baseline around 8.  Given she  is hypervolemic, I question hemodilution and so we will hold off on transfusion for now.  - Repeat CBC with hemoglobin of 7.9 -Continue to monitor  Hypertension Blood pressure elevated, patient is going for dialysis - Continue home regimen  Type 2 diabetes mellitus with other diabetic kidney complication (HCC) - SSI very sensitive  Hyperkalemia In the setting of shortened dialysis recently.  - Plan for dialysis today - One-time dose of Lokelma  Idiopathic peripheral neuropathy - Continue home gabapentin  Facial twitching Patient receives intermittent Xeo injections and is on Sinemet for facial twitching with dystonia.  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  - Continue home Sinemet   Subjective: Patient appears lethargic and somnolent.  Only told me her name, not answering any other questions.  Physical Exam: Vitals:   08/10/22 1428 08/10/22 1435 08/10/22 1443 08/10/22 1500  BP: (!) 188/111  (!) 204/100 (!) 204/98  Pulse: 92  94 94  Resp: (!) 26  (!) 28 (!) 37  Temp: 99.1 F (37.3 C)     TempSrc: Oral     SpO2: 98%  98% 99%  Weight:  107 kg    Height:       General.  Chronically ill appearing, lethargic obese lady, in no acute distress. Pulmonary.  Decreased breath sound at bases, mildly increased work of breathing CV.  Regular rate and rhythm, no JVD, rub or murmur. Abdomen.  Soft, nontender, nondistended, BS positive. CNS.  Lethargic, no apparent focal deficit Extremities.  No edema, no cyanosis, pulses intact and symmetrical.  Data Reviewed: Prior data reviewed  Family Communication: Talked with daughter on phone.  Disposition: Status is: Observation The patient will require care spanning >  2 midnights and should be moved to inpatient because: Severity of illness.  Planned Discharge Destination: Home  DVT prophylaxis: Heparin Time spent: 50 minutes  This record has been created using Conservation officer, historic buildings. Errors have been sought and corrected,but  may not always be located. Such creation errors do not reflect on the standard of care.   Author: Arnetha Courser, MD 08/10/2022 3:40 PM  For on call review [email or web address].

## 2022-08-10 NOTE — Progress Notes (Signed)
[facility]  ROUNDING NOTE   Subjective:   Patient seen laying in bed Ill appearing States she feels short of breath Room air Requesting something for anxiety   Objective:  Vital signs in last 24 hours:  Temp:  [98.1 F (36.7 C)-99.1 F (37.3 C)] 99.1 F (37.3 C) (08/05 0505) Pulse Rate:  [82-98] 98 (08/05 0746) Resp:  [20-24] 20 (08/05 0746) BP: (160-216)/(86-112) 198/94 (08/05 0746) SpO2:  [91 %-100 %] 91 % (08/05 0746) Weight:  [109.1 kg-111.9 kg] 109.6 kg (08/05 0500)  Weight change:  Filed Weights   08/09/22 1509 08/09/22 1906 08/10/22 0500  Weight: 111.9 kg 109.1 kg 109.6 kg    Intake/Output: I/O last 3 completed shifts: In: 750 [P.O.:750] Out: 3000 [Other:3000]   Intake/Output this shift:  No intake/output data recorded.  Physical Exam: General: Ill appearing, restless  Head: Normocephalic, atraumatic. Moist oral mucosal membranes  Eyes: Anicteric  Lungs:  Diminished in bases  Heart: Regular rate and rhythm  Abdomen:  Soft, nontender, obese  Extremities:  No peripheral edema.  Neurologic: Alert, oriented, moving all four extremities  Skin: No lesions  Access: Rt AVF    Basic Metabolic Panel: Recent Labs  Lab 08/09/22 1026 08/09/22 1451 08/10/22 1003  NA 142 142 135  K 6.2* 5.5* 5.4*  CL 100 100 94*  CO2 28 28 28   GLUCOSE 100* 93 98  BUN 55* 59* 38*  CREATININE 9.25* 9.51* 7.17*  CALCIUM 9.2 9.4 9.2  PHOS  --  3.4 4.4    Liver Function Tests: Recent Labs  Lab 08/09/22 1451 08/10/22 1003  ALBUMIN 3.5 3.2*   No results for input(s): "LIPASE", "AMYLASE" in the last 168 hours. No results for input(s): "AMMONIA" in the last 168 hours.  CBC: Recent Labs  Lab 08/09/22 1026 08/09/22 1451 08/10/22 1003  WBC 7.2 7.5 8.2  HGB 6.7* 6.5* 7.9*  HCT 22.0* 20.9* 24.6*  MCV 84.3 82.3 80.1  PLT 262 243 253    Cardiac Enzymes: No results for input(s): "CKTOTAL", "CKMB", "CKMBINDEX", "TROPONINI" in the last 168  hours.  BNP: Invalid input(s): "POCBNP"  CBG: Recent Labs  Lab 08/09/22 1617 08/09/22 2053 08/10/22 0802 08/10/22 1219  GLUCAP 88 118* 97 92    Microbiology: Results for orders placed or performed during the hospital encounter of 08/09/22  SARS Coronavirus 2 by RT PCR (hospital order, performed in [facility] hospital lab) *cepheid single result test* Anterior Nasal Swab     Status: Abnormal   Collection Time: 08/09/22  2:02 PM   Specimen: Anterior Nasal Swab  Result Value Ref Range Status   SARS Coronavirus 2 by RT PCR POSITIVE (A) NEGATIVE Final    Comment: (NOTE) SARS-CoV-2 target nucleic acids are DETECTED  SARS-CoV-2 RNA is generally detectable in upper respiratory specimens  during the acute phase of infection.  Positive results are indicative  of the presence of the identified virus, but do not rule out bacterial infection or co-infection with other pathogens not detected by the test.  Clinical correlation with patient history and  other diagnostic information is necessary to determine patient infection status.  The expected result is negative.  Fact Sheet for Patients:   RoadLapTop.co.za   Fact Sheet for Healthcare Providers:   http://kim-miller.com/    This test is not yet approved or cleared by the [place] FDA and  has been authorized for detection and/or diagnosis of SARS-CoV-2 by FDA under an Emergency Use Authorization (EUA).  This EUA will remain in effect (  meaning this test can be used) for the duration of  the COVID-19 declaration under Section 564(b)(1)  of the Act, 21 U.S.C. section 360-bbb-3(b)(1), unless the authorization is terminated or revoked sooner.   Performed at [facility], [address] [place], [place] [address]     Coagulation Studies: No results for input(s): "LABPROT", "INR" in the last 72 hours.  Urinalysis: No results for input(s): "COLORURINE", "LABSPEC",  "PHURINE", "GLUCOSEU", "HGBUR", "BILIRUBINUR", "KETONESUR", "PROTEINUR", "UROBILINOGEN", "NITRITE", "LEUKOCYTESUR" in the last 72 hours.  Invalid input(s): "APPERANCEUR"    Imaging: DG Chest 2 View  Result Date: 08/09/2022 CLINICAL DATA:  Chest pain, cough, and shortness of breath. EXAM: CHEST - 2 VIEW COMPARISON:  Chest x-ray dated July 28, 2022. CT chest dated July 23, 2022. FINDINGS: Unchanged borderline cardiomegaly. New ill-defined hazy opacity in the right upper and lower lobes. Trace right pleural effusion. No pneumothorax. No acute osseous abnormality. IMPRESSION: 1. New ill-defined hazy opacity in the right upper and lower lobes, favor asymmetric pulmonary edema. 2. Trace right pleural effusion. Electronically Signed   By: Obie Dredge M.D.   On: 08/09/2022 11:17     Medications:    anticoagulant sodium citrate      allopurinol  100 mg Oral QHS   amLODipine  10 mg Oral Daily   carbidopa-levodopa  1 tablet Oral TID   carvedilol  25 mg Oral BID WC   Chlorhexidine Gluconate Cloth  6 each Topical Q0600   DULoxetine  30 mg Oral Daily   ezetimibe  10 mg Oral QHS   gabapentin  100 mg Oral TID   heparin  5,000 Units Subcutaneous Q8H   hydrALAZINE  50 mg Oral Q8H   insulin aspart  0-6 Units Subcutaneous TID WC   isosorbide mononitrate  60 mg Oral QPM   rOPINIRole  1 mg Oral QHS   sodium chloride flush  3 mL Intravenous Q12H   sodium zirconium cyclosilicate  10 g Oral Once   vortioxetine HBr  10 mg Oral Daily   zolpidem  5 mg Oral QHS   acetaminophen **OR** acetaminophen, ALPRAZolam, alteplase, anticoagulant sodium citrate, benzonatate, chlorpheniramine-HYDROcodone, heparin, hydrOXYzine, lidocaine (PF), lidocaine-prilocaine, ondansetron **OR** ondansetron (ZOFRAN) IV, pentafluoroprop-tetrafluoroeth  Assessment/ Plan:  Patricia [name] is a 47 y.o.  female with a past medical history of hypertension, coronary artery disease, congestive heart failure, hyperlipidemia,  end-stage renal disease on hemodialysis via right AV fistula now comes to the emergency room with worsening shortness of breath, feeling weak and tired associated with cough. She has been on a Monday Wednesday Friday schedule for dialysis. She has been admitted under observation for Acute respiratory distress [R06.03] Acute pulmonary edema (HCC) [J81.0] ESRD (end stage renal disease) (HCC) [N18.6] Dyspnea, unspecified type [R06.00]   End stage renal disease with hyperkalemia on hemodialysis. Patient received urgent treatment on Sunday. Potassium corrected with dialysis. Will receive treatment today to maintain outpatient schedule.   2. Anemia of chronic kidney disease Lab Results  Component Value Date   HGB 7.9 (L) 08/10/2022    Hgb below desired target. Vella Kohler at outpatient clinic. Will monitor with anemia protocols.   3. Secondary Hyperparathyroidism: with outpatient labs: PTH 233, phosphorus 6.0, calcium 8.9 on 07/06/22.   Lab Results  Component Value Date   PTH 160 (H) 04/06/2016   CALCIUM 9.2 08/10/2022   CAION 0.99 (L) 02/11/2022   PHOS 4.4 08/10/2022    Bone minerals acceptable. Patient prescribed calcitriol and senispar outpatient.   4. Hypertension with chronic  kidney disease. Home regimen includes Amlodipine, carvedilol, hydralazine, and isosorbide.     LOS: 0   8/5/202412:22 PM

## 2022-08-10 NOTE — Plan of Care (Signed)
Pt alert and oriented, on Isolation precautions for positive COVID. Pt has cough and generalized pain. Pt is dialysis pt, has fistula to rt arm currently receiving dialysis.  Problem: Education: Goal: Ability to describe self-care measures that may prevent or decrease complications (Diabetes Survival Skills Education) will improve Outcome: Not Progressing Goal: Individualized Educational Video(s) Outcome: Not Progressing   Problem: Coping: Goal: Ability to adjust to condition or change in health will improve Outcome: Not Progressing   Problem: Fluid Volume: Goal: Ability to maintain a balanced intake and output will improve Outcome: Not Progressing   Problem: Health Behavior/Discharge Planning: Goal: Ability to identify and utilize available resources and services will improve Outcome: Not Progressing Goal: Ability to manage health-related needs will improve Outcome: Not Progressing   Problem: Metabolic: Goal: Ability to maintain appropriate glucose levels will improve Outcome: Not Progressing   Problem: Nutritional: Goal: Maintenance of adequate nutrition will improve Outcome: Not Progressing Goal: Progress toward achieving an optimal weight will improve Outcome: Not Progressing   Problem: Skin Integrity: Goal: Risk for impaired skin integrity will decrease Outcome: Not Progressing   Problem: Tissue Perfusion: Goal: Adequacy of tissue perfusion will improve Outcome: Not Progressing   Problem: Education: Goal: Knowledge of General Education information will improve Description: Including pain rating scale, medication(s)/side effects and non-pharmacologic comfort measures Outcome: Not Progressing   Problem: Health Behavior/Discharge Planning: Goal: Ability to manage health-related needs will improve Outcome: Not Progressing   Problem: Clinical Measurements: Goal: Ability to maintain clinical measurements within normal limits will improve Outcome: Not  Progressing Goal: Will remain free from infection Outcome: Not Progressing Goal: Diagnostic test results will improve Outcome: Not Progressing Goal: Respiratory complications will improve Outcome: Not Progressing Goal: Cardiovascular complication will be avoided Outcome: Not Progressing   Problem: Activity: Goal: Risk for activity intolerance will decrease Outcome: Not Progressing   Problem: Nutrition: Goal: Adequate nutrition will be maintained Outcome: Not Progressing   Problem: Coping: Goal: Level of anxiety will decrease Outcome: Not Progressing   Problem: Elimination: Goal: Will not experience complications related to bowel motility Outcome: Not Progressing Goal: Will not experience complications related to urinary retention Outcome: Not Progressing   Problem: Pain Managment: Goal: General experience of comfort will improve Outcome: Not Progressing   Problem: Safety: Goal: Ability to remain free from injury will improve Outcome: Not Progressing   Problem: Skin Integrity: Goal: Risk for impaired skin integrity will decrease Outcome: Not Progressing

## 2022-08-10 NOTE — Discharge Planning (Signed)
ESTBLISHED HEMODIALYSIS Outpatient Facility  [facility]  [address]  [place] [address] [phone]  Schedule: Monday Wednesday and Friday @ 05:25am  Dimas Chyle Dialysis Coordinator II  [facility] Cell: [phone] eFax: [phone] [email or web address]

## 2022-08-10 NOTE — Progress Notes (Signed)
Pt resting in bed watching TV, Zofran IVP administered for c/o nausea.

## 2022-08-11 ENCOUNTER — Observation Stay: Payer: Medicare Other

## 2022-08-11 DIAGNOSIS — Z6836 Body mass index (BMI) 36.0-36.9, adult: Secondary | ICD-10-CM | POA: Diagnosis not present

## 2022-08-11 DIAGNOSIS — R0600 Dyspnea, unspecified: Secondary | ICD-10-CM | POA: Diagnosis not present

## 2022-08-11 DIAGNOSIS — Z833 Family history of diabetes mellitus: Secondary | ICD-10-CM | POA: Diagnosis not present

## 2022-08-11 DIAGNOSIS — F419 Anxiety disorder, unspecified: Secondary | ICD-10-CM | POA: Diagnosis present

## 2022-08-11 DIAGNOSIS — E7800 Pure hypercholesterolemia, unspecified: Secondary | ICD-10-CM | POA: Diagnosis present

## 2022-08-11 DIAGNOSIS — I2510 Atherosclerotic heart disease of native coronary artery without angina pectoris: Secondary | ICD-10-CM | POA: Diagnosis present

## 2022-08-11 DIAGNOSIS — I5033 Acute on chronic diastolic (congestive) heart failure: Secondary | ICD-10-CM | POA: Diagnosis present

## 2022-08-11 DIAGNOSIS — Z7682 Awaiting organ transplant status: Secondary | ICD-10-CM | POA: Diagnosis not present

## 2022-08-11 DIAGNOSIS — Z8349 Family history of other endocrine, nutritional and metabolic diseases: Secondary | ICD-10-CM | POA: Diagnosis not present

## 2022-08-11 DIAGNOSIS — Z800 Family history of malignant neoplasm of digestive organs: Secondary | ICD-10-CM | POA: Diagnosis not present

## 2022-08-11 DIAGNOSIS — D631 Anemia in chronic kidney disease: Secondary | ICD-10-CM | POA: Diagnosis present

## 2022-08-11 DIAGNOSIS — Z992 Dependence on renal dialysis: Secondary | ICD-10-CM | POA: Diagnosis not present

## 2022-08-11 DIAGNOSIS — Z8701 Personal history of pneumonia (recurrent): Secondary | ICD-10-CM | POA: Diagnosis not present

## 2022-08-11 DIAGNOSIS — Z8249 Family history of ischemic heart disease and other diseases of the circulatory system: Secondary | ICD-10-CM | POA: Diagnosis not present

## 2022-08-11 DIAGNOSIS — R0602 Shortness of breath: Secondary | ICD-10-CM | POA: Diagnosis present

## 2022-08-11 DIAGNOSIS — G609 Hereditary and idiopathic neuropathy, unspecified: Secondary | ICD-10-CM | POA: Diagnosis present

## 2022-08-11 DIAGNOSIS — J810 Acute pulmonary edema: Secondary | ICD-10-CM | POA: Diagnosis not present

## 2022-08-11 DIAGNOSIS — J1282 Pneumonia due to coronavirus disease 2019: Secondary | ICD-10-CM | POA: Diagnosis present

## 2022-08-11 DIAGNOSIS — G249 Dystonia, unspecified: Secondary | ICD-10-CM | POA: Diagnosis present

## 2022-08-11 DIAGNOSIS — I132 Hypertensive heart and chronic kidney disease with heart failure and with stage 5 chronic kidney disease, or end stage renal disease: Secondary | ICD-10-CM | POA: Diagnosis present

## 2022-08-11 DIAGNOSIS — R0603 Acute respiratory distress: Secondary | ICD-10-CM | POA: Diagnosis present

## 2022-08-11 DIAGNOSIS — I151 Hypertension secondary to other renal disorders: Secondary | ICD-10-CM | POA: Diagnosis not present

## 2022-08-11 DIAGNOSIS — N2581 Secondary hyperparathyroidism of renal origin: Secondary | ICD-10-CM | POA: Diagnosis present

## 2022-08-11 DIAGNOSIS — E669 Obesity, unspecified: Secondary | ICD-10-CM | POA: Diagnosis present

## 2022-08-11 DIAGNOSIS — E1122 Type 2 diabetes mellitus with diabetic chronic kidney disease: Secondary | ICD-10-CM | POA: Diagnosis present

## 2022-08-11 DIAGNOSIS — E1140 Type 2 diabetes mellitus with diabetic neuropathy, unspecified: Secondary | ICD-10-CM | POA: Diagnosis present

## 2022-08-11 DIAGNOSIS — N186 End stage renal disease: Secondary | ICD-10-CM | POA: Diagnosis present

## 2022-08-11 DIAGNOSIS — U071 COVID-19: Secondary | ICD-10-CM | POA: Diagnosis present

## 2022-08-11 DIAGNOSIS — E875 Hyperkalemia: Secondary | ICD-10-CM | POA: Diagnosis present

## 2022-08-11 LAB — GLUCOSE, CAPILLARY
Glucose-Capillary: 99 mg/dL (ref 70–99)
Glucose-Capillary: 122 mg/dL — ABNORMAL HIGH (ref 70–99)
Glucose-Capillary: 130 mg/dL — ABNORMAL HIGH (ref 70–99)

## 2022-08-11 MED ORDER — PREDNISONE 50 MG PO TABS
50.0000 mg | ORAL_TABLET | Freq: Every day | ORAL | Status: DC
  Administered 2022-08-11 – 2022-08-13 (×3): 50 mg via ORAL
  Filled 2022-08-11 (×3): qty 1

## 2022-08-11 MED ORDER — LEVOFLOXACIN 500 MG PO TABS
500.0000 mg | ORAL_TABLET | ORAL | Status: DC
  Administered 2022-08-11 – 2022-08-13 (×2): 500 mg via ORAL
  Filled 2022-08-11 (×2): qty 1

## 2022-08-11 MED ORDER — HYDRALAZINE HCL 50 MG PO TABS
75.0000 mg | ORAL_TABLET | Freq: Three times a day (TID) | ORAL | Status: DC
  Administered 2022-08-11 – 2022-08-13 (×4): 75 mg via ORAL
  Filled 2022-08-11 (×5): qty 2

## 2022-08-11 MED ORDER — CHLORHEXIDINE GLUCONATE CLOTH 2 % EX PADS
6.0000 | MEDICATED_PAD | Freq: Every day | CUTANEOUS | Status: DC
  Administered 2022-08-11 – 2022-08-13 (×3): 6 via TOPICAL

## 2022-08-11 MED ORDER — PROMETHAZINE HCL 25 MG PO TABS
25.0000 mg | ORAL_TABLET | Freq: Four times a day (QID) | ORAL | Status: DC | PRN
  Administered 2022-08-12: 25 mg via ORAL
  Filled 2022-08-11 (×2): qty 1

## 2022-08-11 MED ORDER — SODIUM CHLORIDE 0.9 % IV SOLN
12.5000 mg | Freq: Four times a day (QID) | INTRAVENOUS | Status: DC | PRN
  Filled 2022-08-11: qty 0.5

## 2022-08-11 NOTE — Assessment & Plan Note (Signed)
Patient's dialysis session yesterday was shortened causing pulmonary edema today.  - Nephrology consulted; appreciate their recommendations -Continue with routine Monday, Wednesday and Friday dialysis

## 2022-08-11 NOTE — Progress Notes (Signed)
[facility]  ROUNDING NOTE   Subjective:   Patient seen laying in bed Drowsy, arousable for short periods Continues to complain of fatigue Room air Poor oral intake, denies nausea today   Objective:  Vital signs in last 24 hours:  Temp:  [97.6 F (36.4 C)-99.1 F (37.3 C)] 98 F (36.7 C) (08/06 0805) Pulse Rate:  [85-100] 97 (08/06 0805) Resp:  [18-37] 20 (08/06 0805) BP: (126-210)/(81-124) 188/87 (08/06 0805) SpO2:  [93 %-100 %] 93 % (08/06 0805) Weight:  [106.4 kg-107 kg] 107 kg (08/06 0500)  Weight change: 0.405 kg Filed Weights   08/10/22 1435 08/10/22 1816 08/11/22 0500  Weight: 107 kg 106.4 kg 107 kg    Intake/Output: I/O last 3 completed shifts: In: 450 [P.O.:450] Out: 3500 [Other:3500]   Intake/Output this shift:  No intake/output data recorded.  Physical Exam: General: Ill appearing, no acute distress  Head: Normocephalic, atraumatic. Moist oral mucosal membranes  Eyes: Anicteric  Lungs:  Diminished in bases  Heart: Regular rate and rhythm  Abdomen:  Soft, nontender, obese  Extremities:  No peripheral edema.  Neurologic: Drowsy, moving all four extremities  Skin: No lesions  Access: Rt AVF    Basic Metabolic Panel: Recent Labs  Lab 08/09/22 1026 08/09/22 1451 08/10/22 1003 08/11/22 0535  NA 142 142 135 134*  K 6.2* 5.5* 5.4* 4.5  CL 100 100 94* 94*  CO2 28 28 28 29   GLUCOSE 100* 93 98 94  BUN 55* 59* 38* 24*  CREATININE 9.25* 9.51* 7.17* 5.23*  CALCIUM 9.2 9.4 9.2 9.1  PHOS  --  3.4 4.4  --     Liver Function Tests: Recent Labs  Lab 08/09/22 1451 08/10/22 1003 08/11/22 0535  AST  --   --  27  ALT  --   --  <5  ALKPHOS  --   --  72  BILITOT  --   --  0.7  PROT  --   --  7.2  ALBUMIN 3.5 3.2* 3.5   No results for input(s): "LIPASE", "AMYLASE" in the last 168 hours. No results for input(s): "AMMONIA" in the last 168 hours.  CBC: Recent Labs  Lab 08/09/22 1026 08/09/22 1451 08/10/22 1003 08/11/22 0535  WBC  7.2 7.5 8.2 5.9  HGB 6.7* 6.5* 7.9* 7.6*  HCT 22.0* 20.9* 24.6* 24.4*  MCV 84.3 82.3 80.1 81.6  PLT 262 243 253 243    Cardiac Enzymes: No results for input(s): "CKTOTAL", "CKMB", "CKMBINDEX", "TROPONINI" in the last 168 hours.  BNP: Invalid input(s): "POCBNP"  CBG: Recent Labs  Lab 08/09/22 1617 08/09/22 2053 08/10/22 0802 08/10/22 1219 08/11/22 0802  GLUCAP 88 118* 97 92 99    Microbiology: Results for orders placed or performed during the hospital encounter of 08/09/22  SARS Coronavirus 2 by RT PCR (hospital order, performed in [facility] hospital lab) *cepheid single result test* Anterior Nasal Swab     Status: Abnormal   Collection Time: 08/09/22  2:02 PM   Specimen: Anterior Nasal Swab  Result Value Ref Range Status   SARS Coronavirus 2 by RT PCR POSITIVE (A) NEGATIVE Final    Comment: (NOTE) SARS-CoV-2 target nucleic acids are DETECTED  SARS-CoV-2 RNA is generally detectable in upper respiratory specimens  during the acute phase of infection.  Positive results are indicative  of the presence of the identified virus, but do not rule out bacterial infection or co-infection with other pathogens not detected by the test.  Clinical correlation with patient history and  other diagnostic information is necessary to determine patient infection status.  The expected result is negative.  Fact Sheet for Patients:   RoadLapTop.co.za   Fact Sheet for Healthcare Providers:   http://kim-miller.com/    This test is not yet approved or cleared by the [place] FDA and  has been authorized for detection and/or diagnosis of SARS-CoV-2 by FDA under an Emergency Use Authorization (EUA).  This EUA will remain in effect (meaning this test can be used) for the duration of  the COVID-19 declaration under Section 564(b)(1)  of the Act, 21 U.S.C. section 360-bbb-3(b)(1), unless the authorization is terminated or revoked  sooner.   Performed at [facility], [address] [place], [place] [address]     Coagulation Studies: No results for input(s): "LABPROT", "INR" in the last 72 hours.  Urinalysis: No results for input(s): "COLORURINE", "LABSPEC", "PHURINE", "GLUCOSEU", "HGBUR", "BILIRUBINUR", "KETONESUR", "PROTEINUR", "UROBILINOGEN", "NITRITE", "LEUKOCYTESUR" in the last 72 hours.  Invalid input(s): "APPERANCEUR"    Imaging: DG Chest Port 1 View  Result Date: 08/11/2022 CLINICAL DATA:  Shortness of breath EXAM: PORTABLE CHEST 1 VIEW COMPARISON:  08/09/2022 FINDINGS: Heart size remains mildly enlarged. Mild pulmonary vascular congestion. Improved aeration within the right lung compared to prior. No new focal airspace consolidation. No pleural effusion or pneumothorax. Right axillary vascular stent. IMPRESSION: 1. Mild cardiomegaly and pulmonary vascular congestion. 2. Improved aeration within the right lung compared to prior. Electronically Signed   By: Duanne Guess D.O.   On: 08/11/2022 10:15     Medications:      allopurinol  100 mg Oral QHS   amLODipine  10 mg Oral Daily   ascorbic acid  500 mg Oral BID   carbidopa-levodopa  1 tablet Oral TID   carvedilol  25 mg Oral BID WC   Chlorhexidine Gluconate Cloth  6 each Topical Q0600   DULoxetine  30 mg Oral Daily   ezetimibe  10 mg Oral QHS   gabapentin  100 mg Oral TID   heparin  5,000 Units Subcutaneous Q8H   hydrALAZINE  75 mg Oral Q8H   insulin aspart  0-6 Units Subcutaneous TID WC   isosorbide mononitrate  60 mg Oral QPM   levofloxacin  500 mg Oral Q48H   predniSONE  50 mg Oral Q breakfast   rOPINIRole  1 mg Oral QHS   sodium chloride flush  3 mL Intravenous Q12H   sodium zirconium cyclosilicate  10 g Oral Once   vortioxetine HBr  10 mg Oral Daily   zinc sulfate  220 mg Oral Daily   zolpidem  5 mg Oral QHS   acetaminophen **OR** acetaminophen, ALPRAZolam, benzonatate, chlorpheniramine-HYDROcodone, hydrOXYzine,  ondansetron **OR** ondansetron (ZOFRAN) IV  Assessment/ Plan:  [name] is a 47 y.o.  female with a past medical history of hypertension, coronary artery disease, congestive heart failure, hyperlipidemia, end-stage renal disease on hemodialysis via right AV fistula now comes to the emergency room with worsening shortness of breath, feeling weak and tired associated with cough. She has been on a Monday Wednesday Friday schedule for dialysis. She has been admitted under observation for Acute respiratory distress [R06.03] Acute pulmonary edema (HCC) [J81.0] ESRD (end stage renal disease) (HCC) [N18.6] Dyspnea, unspecified type [R06.00]   End stage renal disease with hyperkalemia on hemodialysis. Potassium corrected with dialysis. Patient tolerated treatment well yesterday, requested reduced UF. 0.5L achieved. Next treatment scheduled for Wednesday  2. Anemia of chronic kidney disease Lab Results  Component Value Date  HGB 7.6 (L) 08/11/2022    Vella Kohler at outpatient clinic. Hgb below desired range, will continue ESA outpatient.  3. Secondary Hyperparathyroidism: with outpatient labs: PTH 233, phosphorus 6.0, calcium 8.9 on 07/06/22.   Lab Results  Component Value Date   PTH 160 (H) 04/06/2016   CALCIUM 9.1 08/11/2022   CAION 0.99 (L) 02/11/2022   PHOS 4.4 08/10/2022    Patient prescribed calcitriol and senispar outpatient. Will continue to monitor bone minerals during this admission.   4. Hypertension with chronic kidney disease. Home regimen includes Amlodipine, carvedilol, hydralazine, and isosorbide.  Blood pressure elevated today, continue current regimen.     LOS: 0   8/6/202412:11 PM

## 2022-08-11 NOTE — Plan of Care (Signed)
Pt alert and oriented, VSS positive for COVID on airborne precautions. Pt has cough being treated with prn cough medication. Pt started on prednisone today. Pt gets up independently.  Problem: Education: Goal: Ability to describe self-care measures that may prevent or decrease complications (Diabetes Survival Skills Education) will improve Outcome: Not Progressing Goal: Individualized Educational Video(s) Outcome: Not Progressing   Problem: Coping: Goal: Ability to adjust to condition or change in health will improve Outcome: Not Progressing   Problem: Fluid Volume: Goal: Ability to maintain a balanced intake and output will improve Outcome: Not Progressing   Problem: Health Behavior/Discharge Planning: Goal: Ability to identify and utilize available resources and services will improve Outcome: Not Progressing Goal: Ability to manage health-related needs will improve Outcome: Not Progressing   Problem: Metabolic: Goal: Ability to maintain appropriate glucose levels will improve Outcome: Not Progressing   Problem: Nutritional: Goal: Maintenance of adequate nutrition will improve Outcome: Not Progressing Goal: Progress toward achieving an optimal weight will improve Outcome: Not Progressing   Problem: Skin Integrity: Goal: Risk for impaired skin integrity will decrease Outcome: Not Progressing   Problem: Tissue Perfusion: Goal: Adequacy of tissue perfusion will improve Outcome: Not Progressing   Problem: Education: Goal: Knowledge of General Education information will improve Description: Including pain rating scale, medication(s)/side effects and non-pharmacologic comfort measures Outcome: Not Progressing   Problem: Health Behavior/Discharge Planning: Goal: Ability to manage health-related needs will improve Outcome: Not Progressing   Problem: Clinical Measurements: Goal: Ability to maintain clinical measurements within normal limits will improve Outcome: Not  Progressing Goal: Will remain free from infection Outcome: Not Progressing Goal: Diagnostic test results will improve Outcome: Not Progressing Goal: Respiratory complications will improve Outcome: Not Progressing Goal: Cardiovascular complication will be avoided Outcome: Not Progressing   Problem: Activity: Goal: Risk for activity intolerance will decrease Outcome: Not Progressing   Problem: Nutrition: Goal: Adequate nutrition will be maintained Outcome: Not Progressing   Problem: Coping: Goal: Level of anxiety will decrease Outcome: Not Progressing   Problem: Elimination: Goal: Will not experience complications related to bowel motility Outcome: Not Progressing Goal: Will not experience complications related to urinary retention Outcome: Not Progressing   Problem: Pain Managment: Goal: General experience of comfort will improve Outcome: Not Progressing   Problem: Safety: Goal: Ability to remain free from injury will improve Outcome: Not Progressing   Problem: Skin Integrity: Goal: Risk for impaired skin integrity will decrease Outcome: Not Progressing

## 2022-08-11 NOTE — Progress Notes (Signed)
Progress Note   Patient: Patricia [name] HQI:[id number] DOB: 06/20/1975 DOA: 08/09/2022     0 DOS: the patient was seen and examined on 08/11/2022   Brief hospital course: Taken from H&P.  Nathaniel Mckenly Bencomo is a 47 y.o. 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  On Friday her dialysis session was shortened due to fistula issues.  On arrival to ED patient was hypertensive at 172/96, pertinent labs with hemoglobin of 6.7, potassium 6.2, mildly positive troponin at 24. Chest x-ray showed new ill-defined hazy opacities in the right upper and lower lobes concerning for edema. Nephrology was consulted.  8/5: Blood pressure elevated at 198/94.  COVID-19 PCR came back positive.  Going for dialysis today.  Potassium at 5.4.  8/6: Vital stable on room air, blood pressure remained elevated.  Increasing the dose of hydralazine to 75 mg 3 times daily, potassium 4.5.  Procalcitonin at 0.24.  Started on Levaquin. Repeat chest x-ray with persistent of opacities, likely COVID-19 pneumonia. Continue to have generalized malaise and does not think that she can go home today   Assessment and Plan: * Acute pulmonary edema (HCC) Patient is presenting with increased shortness of breath and cough with evidence of pulmonary edema seen on chest x-ray after a shortened dialysis session yesterday.  This was due to fistula issues.  Patient also tested positive for COVID. - Continuous pulse oximetry - Supplemental oxygen as needed to maintain oxygen saturation above 88% - Received dialysis yesterday   Pneumonia due to COVID-19 virus COVID PCR came back positive.  Mildly elevated CRP at 1.  Persistent opacities on repeat chest x-ray, concerning for viral pneumonia, procalcitonin was elevated at 0.29. -Started on Levaquin for  concern of superadded bacterial infection Currently on room air. -Continue with supportive care   ESRD (end stage renal disease) (HCC) Patient's dialysis session yesterday was shortened causing pulmonary edema today.  - Nephrology consulted; appreciate their recommendations - Received dialysis yesterday -Continue with routine Monday, Wednesday and Friday dialysis  Anemia due to chronic kidney disease Hemoglobin today 6.7 compared to her baseline around 8.  Given she is hypervolemic, I question hemodilution and so we will hold off on transfusion for now.  - Repeat CBC with hemoglobin of 7.9 -Continue to monitor  Hypertension Blood pressure elevated,  -Increasing the dose of hydralazine to 75 mg 3 times daily - Continue rest of home regimen  Type 2 diabetes mellitus with other diabetic kidney complication (HCC) - SSI very sensitive  Hyperkalemia Resolved.  Idiopathic peripheral neuropathy - Continue home gabapentin  Facial twitching Patient receives intermittent Xeo injections and is on Sinemet for facial twitching with dystonia.  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  - Continue home Sinemet   Subjective: Patient was complaining of nausea and generalized bodyaches.  She was feeling very lethargic and does not think that she can go home.  Continue to have some cough  Physical Exam: Vitals:   08/11/22 0424 08/11/22 0500 08/11/22 0805 08/11/22 1426  BP: (!) 168/82  (!) 188/87 (!) 158/81  Pulse: 93  97 90  Resp: 18  20   Temp: 98.3 F (36.8 C)  98 F (36.7 C)   TempSrc: Oral     SpO2: 97%  93%   Weight:  107 kg    Height:  General.  Ill-appearing obese lady, in no acute distress. Pulmonary.  Lungs clear bilaterally, normal respiratory effort. CV.  Regular rate and rhythm, no JVD, rub or murmur. Abdomen.  Soft, nontender, nondistended, BS positive. CNS.  Alert and oriented .  No focal neurologic deficit. Extremities.  No edema, no cyanosis, pulses intact and  symmetrical. Psychiatry.  Judgment and insight appears normal.   Data Reviewed: Prior data reviewed  Family Communication: Talked with daughter on phone.  Disposition: Status is: In patient. The patient will require care spanning > 2 midnights and should be moved to inpatient because: Severity of illness.  Planned Discharge Destination: Home  DVT prophylaxis: Heparin Time spent: 40 minutes  This record has been created using Conservation officer, historic buildings. Errors have been sought and corrected,but may not always be located. Such creation errors do not reflect on the standard of care.   Author: Arnetha Courser, MD 08/11/2022 3:02 PM  For on call review [email or web address].

## 2022-08-11 NOTE — Plan of Care (Signed)
  Problem: Coping: Goal: Ability to adjust to condition or change in health will improve Outcome: Progressing   Problem: Fluid Volume: Goal: Ability to maintain a balanced intake and output will improve Outcome: Progressing   Problem: Skin Integrity: Goal: Risk for impaired skin integrity will decrease Outcome: Progressing   Problem: Tissue Perfusion: Goal: Adequacy of tissue perfusion will improve Outcome: Progressing   Problem: Education: Goal: Knowledge of General Education information will improve Description: Including pain rating scale, medication(s)/side effects and non-pharmacologic comfort measures Outcome: Progressing   Problem: Safety: Goal: Ability to remain free from injury will improve Outcome: Progressing

## 2022-08-11 NOTE — Assessment & Plan Note (Signed)
Patient is presenting with increased shortness of breath and cough with evidence of pulmonary edema seen on chest x-ray after a shortened dialysis session yesterday.  This was due to fistula issues.  Patient also tested positive for COVID. - Continuous pulse oximetry - Supplemental oxygen as needed to maintain oxygen saturation above 88% - Received dialysis yesterday

## 2022-08-11 NOTE — Assessment & Plan Note (Signed)
Resolved

## 2022-08-11 NOTE — Progress Notes (Signed)
Mobility Specialist - Progress Note  Post-mobility: HR 101, SPO2 99   08/11/22 1519  Mobility  Activity Ambulated independently in room;Stood at bedside  Level of Assistance Independent  Assistive Device None  Distance Ambulated (ft) 12 ft  Activity Response Tolerated well  $Mobility charge 1 Mobility  Mobility Specialist Start Time (ACUTE ONLY) 1458  Mobility Specialist Stop Time (ACUTE ONLY) 1506  Mobility Specialist Time Calculation (min) (ACUTE ONLY) 8 min   Pt supine upon entry, utilizing RA. Pt agreeable to OOB amb within the room this date. Pt completed bed mob indep and sat EOB, some SOB while seated. Pt STS and amb to the door in room indep and returned EOB, while seated Pt expressed more SOB due to activity-- O2 >90%. Pt left supine with needs within reach.   Zetta Bills Mobility Specialist 08/11/22 3:24 PM

## 2022-08-11 NOTE — Assessment & Plan Note (Signed)
Blood pressure elevated,  -Increasing the dose of hydralazine to 75 mg 3 times daily - Continue rest of home regimen

## 2022-08-11 NOTE — Assessment & Plan Note (Signed)
COVID PCR came back positive.  Mildly elevated CRP at 1.  Persistent opacities on repeat chest x-ray, concerning for viral pneumonia, procalcitonin was elevated at 0.29. -Started on Levaquin for concern of superadded bacterial infection Currently on room air. -Continue with supportive care

## 2022-08-12 DIAGNOSIS — U071 COVID-19: Secondary | ICD-10-CM | POA: Diagnosis not present

## 2022-08-12 DIAGNOSIS — I151 Hypertension secondary to other renal disorders: Secondary | ICD-10-CM | POA: Diagnosis not present

## 2022-08-12 DIAGNOSIS — N186 End stage renal disease: Secondary | ICD-10-CM | POA: Diagnosis not present

## 2022-08-12 DIAGNOSIS — J810 Acute pulmonary edema: Secondary | ICD-10-CM | POA: Diagnosis not present

## 2022-08-12 LAB — POTASSIUM: Potassium: 4.6 mmol/L (ref 3.5–5.1)

## 2022-08-12 LAB — GLUCOSE, CAPILLARY
Glucose-Capillary: 122 mg/dL — ABNORMAL HIGH (ref 70–99)
Glucose-Capillary: 113 mg/dL — ABNORMAL HIGH (ref 70–99)
Glucose-Capillary: 130 mg/dL — ABNORMAL HIGH (ref 70–99)
Glucose-Capillary: 147 mg/dL — ABNORMAL HIGH (ref 70–99)

## 2022-08-12 MED ORDER — SODIUM CHLORIDE 0.9 % IV SOLN
12.5000 mg | Freq: Four times a day (QID) | INTRAVENOUS | Status: DC | PRN
  Administered 2022-08-12 – 2022-08-13 (×4): 12.5 mg via INTRAVENOUS
  Filled 2022-08-12: qty 12.5
  Filled 2022-08-12: qty 0.5
  Filled 2022-08-12: qty 12.5

## 2022-08-12 MED ORDER — LANTHANUM CARBONATE 500 MG PO CHEW
3000.0000 mg | CHEWABLE_TABLET | Freq: Three times a day (TID) | ORAL | Status: DC
  Administered 2022-08-12 – 2022-08-13 (×2): 3000 mg via ORAL
  Filled 2022-08-12 (×4): qty 6

## 2022-08-12 NOTE — Progress Notes (Signed)
Progress Note   Patient: Gwendylan Prochnow YQM:[id number] DOB: 12/10/1975 DOA: 08/09/2022     1 DOS: the patient was seen and examined on 08/12/2022   Brief hospital course: Taken from H&P.  Marinda Priscillia Langowski is a 47 y.o. 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  On Friday her dialysis session was shortened due to fistula issues.  On arrival to ED patient was hypertensive at 172/96, pertinent labs with hemoglobin of 6.7, potassium 6.2, mildly positive troponin at 24. Chest x-ray showed new ill-defined hazy opacities in the right upper and lower lobes concerning for edema. Nephrology was consulted.  8/5: Blood pressure elevated at 198/94.  COVID-19 PCR came back positive.  Going for dialysis today.  Potassium at 5.4.  8/6: Vital stable on room air, blood pressure remained elevated.  Increasing the dose of hydralazine to 75 mg 3 times daily, potassium 4.5.  Procalcitonin at 0.24.  Started on Levaquin. Repeat chest x-ray with persistent of opacities, likely COVID-19 pneumonia. Continue to have generalized malaise and does not think that she can go home today.  8/7: Continue to complain of nausea.  Started on 2 L of oxygen overnight.  Able to wean back to room air now.  Patient is stable for discharge after dialysis but her dialysis session will be the last for today due to positive COVID status.  If remains stable we will discharge tomorrow morning   Assessment and Plan: * Acute pulmonary edema (HCC) Patient is presenting with increased shortness of breath and cough with evidence of pulmonary edema seen on chest x-ray after a shortened dialysis session yesterday.  This was due to fistula issues.  Patient also tested positive for COVID. - Continuous pulse oximetry - Supplemental oxygen as needed to  maintain oxygen saturation above 88%   Pneumonia due to COVID-19 virus COVID PCR came back positive.  Mildly elevated CRP at 1.  Persistent opacities on repeat chest x-ray, concerning for viral pneumonia, procalcitonin was elevated at 0.29. -Started on Levaquin for concern of superadded bacterial infection Currently on room air. -Continue with supportive care   ESRD (end stage renal disease) (HCC) Patient's dialysis session yesterday was shortened causing pulmonary edema today.  - Nephrology consulted; appreciate their recommendations -Continue with routine Monday, Wednesday and Friday dialysis  Anemia due to chronic kidney disease Hemoglobin today 6.7 compared to her baseline around 8.  Given she is hypervolemic, I question hemodilution and so we will hold off on transfusion for now.  - Repeat CBC with hemoglobin of 7.9 -Continue to monitor  Hypertension Blood pressure elevated,  -Increasing the dose of hydralazine to 75 mg 3 times daily - Continue rest of home regimen  Type 2 diabetes mellitus with other diabetic kidney complication (HCC) - SSI very sensitive  Hyperkalemia Resolved.  Idiopathic peripheral neuropathy - Continue home gabapentin  Facial twitching Patient receives intermittent Xeo injections and is on Sinemet for facial twitching with dystonia.  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  - Continue home Sinemet   Subjective: Patient was complaining of nausea, coughing and some shortness of breath.  Physical Exam: Vitals:   08/11/22 2113 08/12/22 0524 08/12/22 0825 08/12/22 1456  BP: 133/66 (!) 142/85 (!) 145/88 133/73  Pulse: 85 94 89 81  Resp: 18 18 20 17   Temp:  99.6 F (37.6 C) 98.4 F (36.9  C)   TempSrc:      SpO2: 94% 100% 100% 100%  Weight:      Height:       General.  Ill-appearing obese lady, in no acute distress. Pulmonary.  Lungs clear bilaterally, normal respiratory effort. CV.  Regular rate and rhythm, no JVD, rub or murmur. Abdomen.  Soft,  nontender, nondistended, BS positive. CNS.  Alert and oriented .  No focal neurologic deficit. Extremities.  No edema, no cyanosis, pulses intact and symmetrical. Psychiatry.  Judgment and insight appears normal.    Data Reviewed: Prior data reviewed  Family Communication: Talked with daughter on phone.  Disposition: Status is: In patient. The patient will require care spanning > 2 midnights and should be moved to inpatient because: Severity of illness.  Planned Discharge Destination: Home  DVT prophylaxis: Heparin Time spent: 39 minutes  This record has been created using Conservation officer, historic buildings. Errors have been sought and corrected,but may not always be located. Such creation errors do not reflect on the standard of care.   Author: Arnetha Courser, MD 08/12/2022 4:03 PM  For on call review [email or web address].

## 2022-08-12 NOTE — Progress Notes (Signed)
IV consult order placed @ 0102. Patient still without IV access at 0330. Attempted x2 with no success. Made charge nurse aware. Confirmed that IV team was scheduled & order was still active.

## 2022-08-12 NOTE — Plan of Care (Signed)

## 2022-08-12 NOTE — Progress Notes (Signed)
   Informed consent signed and in chart.    TX duration: 3.25 hrs     Hand-off given to patient's nurse.    Access used: R UA AVF Access issues: Venous needle high pressure, pain in needle sites per pt   Total UF removed: 2000 mL Medication(s) given: N/A Post HD VS: WNL Post HD weight: 109.3 kg     Lynann Beaver  [facility]

## 2022-08-12 NOTE — Progress Notes (Signed)
[facility]  ROUNDING NOTE   Subjective:   Patient seen laying in bed Coughing frequently Continues to complain of weakness  Dialysis scheduled for later today   Objective:  Vital signs in last 24 hours:  Temp:  [98.4 F (36.9 C)-99.6 F (37.6 C)] 98.4 F (36.9 C) (08/07 0825) Pulse Rate:  [85-94] 89 (08/07 0825) Resp:  [18-20] 20 (08/07 0825) BP: (133-161)/(66-89) 145/88 (08/07 0825) SpO2:  [94 %-100 %] 100 % (08/07 0825)  Weight change:  Filed Weights   08/10/22 1435 08/10/22 1816 08/11/22 0500  Weight: 107 kg 106.4 kg 107 kg    Intake/Output: I/O last 3 completed shifts: In: 120 [P.O.:120] Out: -    Intake/Output this shift:  No intake/output data recorded.  Physical Exam: General: Ill appearing, no acute distress  Head: Normocephalic, atraumatic. Moist oral mucosal membranes  Eyes: Anicteric  Lungs:  Diminished in bases  Heart: Regular rate and rhythm  Abdomen:  Soft, nontender, obese  Extremities:  No peripheral edema.  Neurologic: Alert, moving all four extremities  Skin: No lesions  Access: Rt AVF    Basic Metabolic Panel: Recent Labs  Lab 08/09/22 1026 08/09/22 1451 08/10/22 1003 08/11/22 0535  NA 142 142 135 134*  K 6.2* 5.5* 5.4* 4.5  CL 100 100 94* 94*  CO2 28 28 28 29   GLUCOSE 100* 93 98 94  BUN 55* 59* 38* 24*  CREATININE 9.25* 9.51* 7.17* 5.23*  CALCIUM 9.2 9.4 9.2 9.1  PHOS  --  3.4 4.4  --     Liver Function Tests: Recent Labs  Lab 08/09/22 1451 08/10/22 1003 08/11/22 0535  AST  --   --  27  ALT  --   --  <5  ALKPHOS  --   --  72  BILITOT  --   --  0.7  PROT  --   --  7.2  ALBUMIN 3.5 3.2* 3.5   No results for input(s): "LIPASE", "AMYLASE" in the last 168 hours. No results for input(s): "AMMONIA" in the last 168 hours.  CBC: Recent Labs  Lab 08/09/22 1026 08/09/22 1451 08/10/22 1003 08/11/22 0535  WBC 7.2 7.5 8.2 5.9  HGB 6.7* 6.5* 7.9* 7.6*  HCT 22.0* 20.9* 24.6* 24.4*  MCV 84.3 82.3 80.1 81.6   PLT 262 243 253 243    Cardiac Enzymes: No results for input(s): "CKTOTAL", "CKMB", "CKMBINDEX", "TROPONINI" in the last 168 hours.  BNP: Invalid input(s): "POCBNP"  CBG: Recent Labs  Lab 08/11/22 0802 08/11/22 1603 08/11/22 2114 08/12/22 0801 08/12/22 1138  GLUCAP 99 122* 130* 122* 113*    Microbiology: Results for orders placed or performed during the hospital encounter of 08/09/22  SARS Coronavirus 2 by RT PCR (hospital order, performed in [facility] hospital lab) *cepheid single result test* Anterior Nasal Swab     Status: Abnormal   Collection Time: 08/09/22  2:02 PM   Specimen: Anterior Nasal Swab  Result Value Ref Range Status   SARS Coronavirus 2 by RT PCR POSITIVE (A) NEGATIVE Final    Comment: (NOTE) SARS-CoV-2 target nucleic acids are DETECTED  SARS-CoV-2 RNA is generally detectable in upper respiratory specimens  during the acute phase of infection.  Positive results are indicative  of the presence of the identified virus, but do not rule out bacterial infection or co-infection with other pathogens not detected by the test.  Clinical correlation with patient history and  other diagnostic information is necessary to determine patient infection status.  The expected result  is negative.  Fact Sheet for Patients:   RoadLapTop.co.za   Fact Sheet for Healthcare Providers:   http://kim-miller.com/    This test is not yet approved or cleared by the [place] FDA and  has been authorized for detection and/or diagnosis of SARS-CoV-2 by FDA under an Emergency Use Authorization (EUA).  This EUA will remain in effect (meaning this test can be used) for the duration of  the COVID-19 declaration under Section 564(b)(1)  of the Act, 21 U.S.C. section 360-bbb-3(b)(1), unless the authorization is terminated or revoked sooner.   Performed at [facility], [address] [place], [place] [address]      Coagulation Studies: No results for input(s): "LABPROT", "INR" in the last 72 hours.  Urinalysis: No results for input(s): "COLORURINE", "LABSPEC", "PHURINE", "GLUCOSEU", "HGBUR", "BILIRUBINUR", "KETONESUR", "PROTEINUR", "UROBILINOGEN", "NITRITE", "LEUKOCYTESUR" in the last 72 hours.  Invalid input(s): "APPERANCEUR"    Imaging: DG Chest Port 1 View  Result Date: 08/11/2022 CLINICAL DATA:  Shortness of breath EXAM: PORTABLE CHEST 1 VIEW COMPARISON:  08/09/2022 FINDINGS: Heart size remains mildly enlarged. Mild pulmonary vascular congestion. Improved aeration within the right lung compared to prior. No new focal airspace consolidation. No pleural effusion or pneumothorax. Right axillary vascular stent. IMPRESSION: 1. Mild cardiomegaly and pulmonary vascular congestion. 2. Improved aeration within the right lung compared to prior. Electronically Signed   By: Duanne Guess D.O.   On: 08/11/2022 10:15     Medications:    promethazine (PHENERGAN) injection (IM or IVPB) 12.5 mg (08/12/22 1051)     allopurinol  100 mg Oral QHS   amLODipine  10 mg Oral Daily   ascorbic acid  500 mg Oral BID   carbidopa-levodopa  1 tablet Oral TID   carvedilol  25 mg Oral BID WC   Chlorhexidine Gluconate Cloth  6 each Topical Q0600   DULoxetine  30 mg Oral Daily   ezetimibe  10 mg Oral QHS   gabapentin  100 mg Oral TID   heparin  5,000 Units Subcutaneous Q8H   hydrALAZINE  75 mg Oral Q8H   insulin aspart  0-6 Units Subcutaneous TID WC   isosorbide mononitrate  60 mg Oral QPM   levofloxacin  500 mg Oral Q48H   predniSONE  50 mg Oral Q breakfast   rOPINIRole  1 mg Oral QHS   sodium chloride flush  3 mL Intravenous Q12H   sodium zirconium cyclosilicate  10 g Oral Once   vortioxetine HBr  10 mg Oral Daily   zinc sulfate  220 mg Oral Daily   zolpidem  5 mg Oral QHS   acetaminophen **OR** acetaminophen, ALPRAZolam, benzonatate, chlorpheniramine-HYDROcodone, hydrOXYzine, ondansetron **OR**  ondansetron (ZOFRAN) IV, promethazine (PHENERGAN) injection (IM or IVPB), promethazine  Assessment/ Plan:  Patricia [name] is a 47 y.o.  female with a past medical history of hypertension, coronary artery disease, congestive heart failure, hyperlipidemia, end-stage renal disease on hemodialysis via right AV fistula now comes to the emergency room with worsening shortness of breath, feeling weak and tired associated with cough. She has been on a Monday Wednesday Friday schedule for dialysis. She has been admitted under observation for Acute respiratory distress [R06.03] Acute pulmonary edema (HCC) [J81.0] ESRD (end stage renal disease) (HCC) [N18.6] Dyspnea, unspecified type [R06.00]   End stage renal disease with hyperkalemia on hemodialysis. Potassium corrected with dialysis.  Dialysis scheduled for later today.  2. Anemia of chronic kidney disease Lab Results  Component Value Date   HGB 7.6 (L)  08/11/2022    Vella Kohler at outpatient clinic. Hgb not at goal, will order EPO 10000 units with dialysis.  3. Secondary Hyperparathyroidism: with outpatient labs: PTH 233, phosphorus 6.0, calcium 8.9 on 07/06/22.   Lab Results  Component Value Date   PTH 160 (H) 04/06/2016   CALCIUM 9.1 08/11/2022   CAION 0.99 (L) 02/11/2022   PHOS 4.4 08/10/2022    Patient prescribed calcitriol and senispar outpatient. Will continue to monitor bone minerals during this admission.   4. Hypertension with chronic kidney disease. Home regimen includes Amlodipine, carvedilol, hydralazine, and isosorbide.  Blood pressure stable today for this admission.    LOS: 1   8/7/202412:49 PM

## 2022-08-12 NOTE — TOC Initial Note (Addendum)
Transition of Care [facility] - Initial/Assessment Note    Patient Details  Name: Patricia [name] MRN: [id number] Date of Birth: 1975/09/21  Transition of [facility]) CM/SW Contact:    Allena Katz, LCSW Phone Number: 08/12/2022, 9:33 AM  Clinical Narrative:  Pt admitted from home with Covid AXO4 Pt has outpatient dialysis with [facility] MWF. Pt lives with her two daughters who assist with transport to appointments. Pt active with Dr. Barth Kirks for primary care. Pt has no transportation needs. Per notes, pt had [facility] CSW to confirm with corey with [facility]. No other anticipated TOC needs at this time.                        9:51am Per Denyse Amass with [facility], pt is not active with them for [facility].         Patient Goals and CMS Choice            Expected Discharge Plan and Services                                              Prior Living Arrangements/Services                       Activities of Daily Living Home Assistive Devices/Equipment: None ADL Screening (condition at time of admission) Patient's cognitive ability adequate to safely complete daily activities?: Yes Is the patient deaf or have difficulty hearing?: Yes Does the patient have difficulty seeing, even when wearing glasses/contacts?: No Does the patient have difficulty concentrating, remembering, or making decisions?: No Patient able to express need for assistance with ADLs?: Yes Does the patient have difficulty dressing or bathing?: No Independently performs ADLs?: Yes (appropriate for developmental age) Does the patient have difficulty walking or climbing stairs?: Yes (with breathing at times) Weakness of Legs: None Weakness of Arms/Hands: None  Permission Sought/Granted                  Emotional Assessment              Admission diagnosis:  Acute respiratory distress [R06.03] Acute pulmonary edema (HCC) [J81.0] ESRD (end stage renal disease) (HCC) [N18.6] Dyspnea,  unspecified type [R06.00] Patient Active Problem List   Diagnosis Date Noted   Acute respiratory distress 08/11/2022   Pneumonia due to COVID-19 virus 08/10/2022   Facial twitching 08/09/2022   CAP (community acquired pneumonia) 07/21/2022   Acute pulmonary edema (HCC) 07/20/2022   Hypervolemia associated with renal insufficiency 05/11/2022   Hyperkalemia 05/11/2022   Restless leg syndrome 04/23/2022   Anaphylactic shock, unspecified, initial encounter 09/07/2021   Gout 08/21/2021   GERD without esophagitis 08/21/2021   Anxiety and depression 08/21/2021   Neuropathic pain 05/26/2021   Dystonia 01/13/2021   Polyneuropathy associated with underlying disease (HCC) 01/13/2021   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   Other specified complication of vascular prosthetic devices, implants and grafts, initial encounter (HCC) 09/03/2020   Contact with and (suspected) exposure to covid-19 08/13/2020   Altered mental status, unspecified 06/25/2020   Other irregular eye movements 06/25/2020   PD catheter dysfunction (HCC) 04/26/2020   Other disorders of phosphorus metabolism 04/17/2020   Hypercalcemia 04/15/2020   Impacted cerumen of both ears 04/05/2020   Chronic gouty arthritis 04/05/2020   Generalized anxiety disorder 04/05/2020   History of hypertension 04/05/2020   Idiopathic peripheral neuropathy  04/05/2020   Primary insomnia 04/05/2020   Malnutrition of moderate degree 03/13/2020   Hypertensive urgency 03/11/2020   Myoclonia 03/11/2020   Abscess of vulva 02/19/2020   Pericolonic abscess 02/06/2020   Chest pain 05/10/2019   Anemia 04/25/2019   SVT (supraventricular tachycardia) 04/25/2019   Elevated troponin 04/25/2019   ESRD (end stage renal disease) (HCC) 04/25/2019   Bacterial infection, unspecified 04/17/2019   Pain, unspecified 04/17/2019   Pruritus, unspecified 04/17/2019   Benign paroxysmal positional vertigo of left ear 08/17/2018   Acute on chronic blood loss anemia 05/21/2018    GI bleed 05/20/2018   Symptomatic anemia 10/28/2017   Acidosis 05/24/2017   Chills (without fever) 05/24/2017   Coagulation defect, unspecified (HCC) 05/24/2017   Dependence on renal dialysis (HCC) 05/24/2017   Encounter for fitting and adjustment of extracorporeal dialysis catheter (HCC) 05/24/2017   Fluid overload, unspecified 05/24/2017   Iron deficiency anemia, unspecified 05/24/2017   Liver disease, unspecified 05/24/2017   Other disorders of electrolyte and fluid balance, not elsewhere classified 05/24/2017   Other disorders resulting from impaired renal tubular function 05/24/2017   Other long term (current) drug therapy 05/24/2017   Secondary hyperparathyroidism of renal origin (HCC) 05/24/2017   Unspecified abnormal findings in urine 05/24/2017   Unspecified jaundice 05/24/2017   Chronic kidney disease 05/24/2017   Type 2 diabetes mellitus with other diabetic kidney complication (HCC) 05/24/2017   ESRD on peritoneal dialysis (HCC) 04/01/2016   Anemia due to chronic kidney disease 04/01/2016   GERD (gastroesophageal reflux disease) 04/01/2016   Atypical chest pain    Nephrotic syndrome 10/02/2013   Dyslipidemia 10/02/2013   PAT (paroxysmal atrial tachycardia) 09/29/2013   Normocytic anemia 06/29/2013   Glomerulonephritis 01/28/2013   Dyspnea 07/04/2011   Morbid obesity (HCC) 07/04/2011   Hypertension 07/04/2011   Hypercholesterolemia 07/04/2011   PCP:  Rick Duff, PA-C Pharmacy:   [id number] [place], [place] - [address] AT [facility] [address] [place] [address] Phone: [phone] Fax: [phone]  [facility] [address] [place] [address] Phone: [phone] Fax: [phone]  [facility] [place], [place] - [address] [place] [address] Phone: [phone] Fax: [phone]  [id number] -  [place], [place] - [address] AT [facility] OF [address] [place] [address] Phone: [phone] Fax: [phone]  [facility] [address] [place] [address] Phone: [phone] Fax: [phone]     Social Determinants of Health (SDOH) Social History: SDOH Screenings   Food Insecurity: No Food Insecurity (08/09/2022)  Housing: Low Risk  (08/09/2022)  Transportation Needs: No Transportation Needs (08/09/2022)  Utilities: Not At Risk (08/09/2022)  Financial Resource Strain: Low Risk  (09/04/2019)   Received from [facility], [facility]  Physical Activity: Insufficiently Active (09/04/2019)   Received from [facility], [facility]  Social Connections: Unknown (05/10/2021)   Received from [facility], [facility]  Stress: Stress Concern Present (09/04/2019)   Received from [facility], [facility]  Tobacco Use: Low Risk  (08/09/2022)  Health Literacy: Low Risk  (07/04/2019)   Received from [facility], [facility]   SDOH Interventions:     Readmission Risk Interventions    08/12/2022    9:33 AM 07/23/2022    9:17 AM 05/13/2022    1:57 PM  Readmission Risk Prevention Plan  Transportation Screening Complete Complete   Medication Review Oceanographer) Complete Complete   PCP or Specialist appointment within 3-5 days of discharge Complete  --  HRI or Home Care Consult Complete  Not Complete  HRI or Home Care Consult Pt Refusal Comments   NA  SW Recovery Care/Counseling Consult  Complete   Palliative Care Screening  Not Applicable   Skilled Nursing Facility Not Applicable Not Applicable

## 2022-08-13 DIAGNOSIS — N186 End stage renal disease: Secondary | ICD-10-CM | POA: Diagnosis not present

## 2022-08-13 DIAGNOSIS — U071 COVID-19: Secondary | ICD-10-CM | POA: Diagnosis not present

## 2022-08-13 DIAGNOSIS — J810 Acute pulmonary edema: Secondary | ICD-10-CM | POA: Diagnosis not present

## 2022-08-13 DIAGNOSIS — R0600 Dyspnea, unspecified: Secondary | ICD-10-CM | POA: Diagnosis not present

## 2022-08-13 LAB — GLUCOSE, CAPILLARY: Glucose-Capillary: 119 mg/dL — ABNORMAL HIGH (ref 70–99)

## 2022-08-13 MED ORDER — LEVOFLOXACIN 500 MG PO TABS: 500.0000 mg | ORAL_TABLET | ORAL | 0 refills | Status: AC

## 2022-08-13 MED ORDER — OMEPRAZOLE 40 MG PO CPDR: 40.0000 mg | DELAYED_RELEASE_CAPSULE | Freq: Every day | ORAL | 1 refills | Status: AC

## 2022-08-13 MED ORDER — HYDROCOD POLI-CHLORPHE POLI ER 10-8 MG/5ML PO SUER: 5.0000 mL | Freq: Two times a day (BID) | ORAL | 0 refills | Status: DC | PRN

## 2022-08-13 MED ORDER — ZINC SULFATE 220 (50 ZN) MG PO CAPS: 220.0000 mg | ORAL_CAPSULE | Freq: Every day | ORAL | 0 refills | Status: DC

## 2022-08-13 MED ORDER — ASCORBIC ACID 500 MG PO TABS: 500.0000 mg | ORAL_TABLET | Freq: Two times a day (BID) | ORAL | 0 refills | Status: DC

## 2022-08-13 MED ORDER — BENZONATATE 200 MG PO CAPS: 200.0000 mg | ORAL_CAPSULE | Freq: Three times a day (TID) | ORAL | 0 refills | Status: DC | PRN

## 2022-08-13 MED ORDER — HYDRALAZINE HCL 25 MG PO TABS: 75.0000 mg | ORAL_TABLET | Freq: Three times a day (TID) | ORAL | 2 refills | Status: DC

## 2022-08-13 NOTE — Progress Notes (Signed)
[facility]  ROUNDING NOTE   Subjective:   Patient seen resting in bed, alert and oriented Patient appears much improved today Room air States she is less short of breath   Objective:  Vital signs in last 24 hours:  Temp:  [97.5 F (36.4 C)-98.8 F (37.1 C)] 97.5 F (36.4 C) (08/08 0747) Pulse Rate:  [77-93] 88 (08/08 0747) Resp:  [16-24] 18 (08/08 0747) BP: (133-171)/(68-90) 143/85 (08/08 0747) SpO2:  [98 %-100 %] 100 % (08/08 0747) Weight:  [105.5 kg-111.2 kg] 105.5 kg (08/08 0613)  Weight change:  Filed Weights   08/12/22 1626 08/12/22 2027 08/13/22 0613  Weight: 111.2 kg 109.3 kg 105.5 kg    Intake/Output: I/O last 3 completed shifts: In: 202.3 [P.O.:120; IV Piggyback:82.3] Out: 2000 [Other:2000]   Intake/Output this shift:  No intake/output data recorded.  Physical Exam: General: no acute distress  Head: Normocephalic, atraumatic. Moist oral mucosal membranes  Eyes: Anicteric  Lungs:  Diminished in bases  Heart: Regular rate and rhythm  Abdomen:  Soft, nontender, obese  Extremities:  No peripheral edema.  Neurologic: Alert, moving all four extremities  Skin: No lesions  Access: Rt AVF    Basic Metabolic Panel: Recent Labs  Lab 08/09/22 1026 08/09/22 1451 08/10/22 1003 08/11/22 0535 08/12/22 1617 08/12/22 2208  NA 142 142 135 134* 132*  --   K 6.2* 5.5* 5.4* 4.5 6.5* 4.6  CL 100 100 94* 94* 93*  --   CO2 28 28 28 29 25   --   GLUCOSE 100* 93 98 94 132*  --   BUN 55* 59* 38* 24* 50*  --   CREATININE 9.25* 9.51* 7.17* 5.23* 8.95*  --   CALCIUM 9.2 9.4 9.2 9.1 9.1  --   PHOS  --  3.4 4.4  --  5.8*  --     Liver Function Tests: Recent Labs  Lab 08/09/22 1451 08/10/22 1003 08/11/22 0535 08/12/22 1617  AST  --   --  27  --   ALT  --   --  <5  --   ALKPHOS  --   --  72  --   BILITOT  --   --  0.7  --   PROT  --   --  7.2  --   ALBUMIN 3.5 3.2* 3.5 3.7   No results for input(s): "LIPASE", "AMYLASE" in the last 168 hours. No  results for input(s): "AMMONIA" in the last 168 hours.  CBC: Recent Labs  Lab 08/09/22 1026 08/09/22 1451 08/10/22 1003 08/11/22 0535 08/12/22 1617  WBC 7.2 7.5 8.2 5.9 4.9  HGB 6.7* 6.5* 7.9* 7.6* 7.3*  HCT 22.0* 20.9* 24.6* 24.4* 23.6*  MCV 84.3 82.3 80.1 81.6 81.1  PLT 262 243 253 243 272    Cardiac Enzymes: No results for input(s): "CKTOTAL", "CKMB", "CKMBINDEX", "TROPONINI" in the last 168 hours.  BNP: Invalid input(s): "POCBNP"  CBG: Recent Labs  Lab 08/12/22 0801 08/12/22 1138 08/12/22 1610 08/12/22 2128 08/13/22 0747  GLUCAP 122* 113* 130* 147* 119*    Microbiology: Results for orders placed or performed during the hospital encounter of 08/09/22  SARS Coronavirus 2 by RT PCR (hospital order, performed in [facility] hospital lab) *cepheid single result test* Anterior Nasal Swab     Status: Abnormal   Collection Time: 08/09/22  2:02 PM   Specimen: Anterior Nasal Swab  Result Value Ref Range Status   SARS Coronavirus 2 by RT PCR POSITIVE (A) NEGATIVE Final    Comment: (  NOTE) SARS-CoV-2 target nucleic acids are DETECTED  SARS-CoV-2 RNA is generally detectable in upper respiratory specimens  during the acute phase of infection.  Positive results are indicative  of the presence of the identified virus, but do not rule out bacterial infection or co-infection with other pathogens not detected by the test.  Clinical correlation with patient history and  other diagnostic information is necessary to determine patient infection status.  The expected result is negative.  Fact Sheet for Patients:   RoadLapTop.co.za   Fact Sheet for Healthcare Providers:   http://kim-miller.com/    This test is not yet approved or cleared by the [place] FDA and  has been authorized for detection and/or diagnosis of SARS-CoV-2 by FDA under an Emergency Use Authorization (EUA).  This EUA will remain in effect (meaning this test can  be used) for the duration of  the COVID-19 declaration under Section 564(b)(1)  of the Act, 21 U.S.C. section 360-bbb-3(b)(1), unless the authorization is terminated or revoked sooner.   Performed at [facility], [address] [place], [place] [address]     Coagulation Studies: No results for input(s): "LABPROT", "INR" in the last 72 hours.  Urinalysis: No results for input(s): "COLORURINE", "LABSPEC", "PHURINE", "GLUCOSEU", "HGBUR", "BILIRUBINUR", "KETONESUR", "PROTEINUR", "UROBILINOGEN", "NITRITE", "LEUKOCYTESUR" in the last 72 hours.  Invalid input(s): "APPERANCEUR"    Imaging: No results found.   Medications:    promethazine (PHENERGAN) injection (IM or IVPB) 12.5 mg (08/13/22 1111)     allopurinol  100 mg Oral QHS   amLODipine  10 mg Oral Daily   ascorbic acid  500 mg Oral BID   carbidopa-levodopa  1 tablet Oral TID   carvedilol  25 mg Oral BID WC   Chlorhexidine Gluconate Cloth  6 each Topical Q0600   DULoxetine  30 mg Oral Daily   ezetimibe  10 mg Oral QHS   gabapentin  100 mg Oral TID   heparin  5,000 Units Subcutaneous Q8H   hydrALAZINE  75 mg Oral Q8H   insulin aspart  0-6 Units Subcutaneous TID WC   isosorbide mononitrate  60 mg Oral QPM   lanthanum  3,000 mg Oral TID WC   levofloxacin  500 mg Oral Q48H   predniSONE  50 mg Oral Q breakfast   rOPINIRole  1 mg Oral QHS   sodium chloride flush  3 mL Intravenous Q12H   sodium zirconium cyclosilicate  10 g Oral Once   vortioxetine HBr  10 mg Oral Daily   zinc sulfate  220 mg Oral Daily   zolpidem  5 mg Oral QHS   acetaminophen **OR** acetaminophen, ALPRAZolam, benzonatate, chlorpheniramine-HYDROcodone, hydrOXYzine, ondansetron **OR** ondansetron (ZOFRAN) IV, promethazine (PHENERGAN) injection (IM or IVPB), promethazine  Assessment/ Plan:  Patricia [name] is a 47 y.o.  female with a past medical history of hypertension, coronary artery disease, congestive heart failure,  hyperlipidemia, end-stage renal disease on hemodialysis via right AV fistula now comes to the emergency room with worsening shortness of breath, feeling weak and tired associated with cough. She has been on a Monday Wednesday Friday schedule for dialysis. She has been admitted under observation for Acute respiratory distress [R06.03] Acute pulmonary edema (HCC) [J81.0] ESRD (end stage renal disease) (HCC) [N18.6] Dyspnea, unspecified type [R06.00]   End stage renal disease with hyperkalemia on hemodialysis. Potassium corrected with dialysis.  Patient receive dialysis yesterday, UF 2 L achieved.  Next treatment scheduled for Friday.  Patient cleared to discharge from renal standpoint.  2. Anemia  of chronic kidney disease Lab Results  Component Value Date   HGB 7.3 (L) 08/12/2022    Vella Kohler at outpatient clinic.  Hemoglobin not at goal.  3. Secondary Hyperparathyroidism: with outpatient labs: PTH 233, phosphorus 6.0, calcium 8.9 on 07/06/22.   Lab Results  Component Value Date   PTH 160 (H) 04/06/2016   CALCIUM 9.1 08/12/2022   CAION 0.99 (L) 02/11/2022   PHOS 5.8 (H) 08/12/2022    Patient prescribed calcitriol and senispar outpatient. Will continue to monitor bone minerals during this admission.   4. Hypertension with chronic kidney disease. Home regimen includes Amlodipine, carvedilol, hydralazine, and isosorbide.  Blood pressure stable     LOS: 2   8/8/20241:48 PM

## 2022-08-13 NOTE — Plan of Care (Signed)

## 2022-08-13 NOTE — Discharge Summary (Signed)
Physician Discharge Summary   Patient: Patricia [name] MRN: [id number] DOB: 1975/02/15  Admit date:     08/09/2022  Discharge date: 08/13/22  Discharge Physician: Arnetha Courser   PCP: Rick Duff, PA-C   Recommendations at discharge:  Please obtain CBC and BMP in 1 week Please repeat chest x-ray in 3 to 4 weeks to see the resolution of current abnormalities. Patient is being given to more doses of levofloxacin Continue with routine dialysis Follow-up with PCP  Discharge Diagnoses: Principal Problem:   Acute pulmonary edema (HCC) Active Problems:   ESRD (end stage renal disease) (HCC)   Pneumonia due to COVID-19 virus   Anemia due to chronic kidney disease   Hypertension   Type 2 diabetes mellitus with other diabetic kidney complication (HCC)   Hyperkalemia   Idiopathic peripheral neuropathy   Facial twitching   Dyspnea   Acute respiratory distress   Hospital Course: Taken from H&P.  Patricia [name] is a 47 y.o. 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  On Friday her dialysis session was shortened due to fistula issues.  On arrival to ED patient was hypertensive at 172/96, pertinent labs with hemoglobin of 6.7, potassium 6.2, mildly positive troponin at 24. Chest x-ray showed new ill-defined hazy opacities in the right upper and lower lobes concerning for edema. Nephrology was consulted.  8/5: Blood pressure elevated at 198/94.  COVID-19 PCR came back positive.  Going for dialysis today.  Potassium at 5.4.  8/6: Vital stable on room air, blood pressure remained elevated.  Increasing the dose of hydralazine to 75 mg 3 times daily, potassium 4.5.  Procalcitonin at 0.24.  Started on Levaquin. Repeat chest x-ray with persistent of opacities, likely COVID-19 pneumonia. Continue  to have generalized malaise and does not think that she can go home today.  8/7: Continue to complain of nausea.  Started on 2 L of oxygen overnight.  Able to wean back to room air now.  Patient is stable for discharge after dialysis but her dialysis session will be the last for today due to positive COVID status.   8/8: Patient remained stable.  On room air.  Continue to have cough.  She is being discharged on 2 more doses of levofloxacin and Tussionex prescription was also given with instructions to mostly use at night.  Patient will get benefit from repeat chest x-ray in 3 to 4 weeks which can be done by her primary care provider.  She will continue her routine dialysis and rest of her home medications.  Her home hydralazine dose was increased to 75 mg 3 times a day as blood pressure remained elevated on 50 mg 3 times a day which was her prior dose.  Patient will continue on current medications and need to have a close follow-up with her providers for further recommendations.  Assessment and Plan: * Acute pulmonary edema (HCC) Patient is presenting with increased shortness of breath and cough with evidence of pulmonary edema seen on chest x-ray after a shortened dialysis session yesterday.  This was due to fistula issues.  Patient also tested positive for COVID. - Continuous pulse oximetry - Supplemental oxygen as needed to maintain oxygen saturation above 88%   Pneumonia due to COVID-19 virus COVID PCR came back positive.  Mildly elevated CRP at 1.  Persistent opacities on repeat  chest x-ray, concerning for viral pneumonia, procalcitonin was elevated at 0.29. -Started on Levaquin for concern of superadded bacterial infection Currently on room air. -Continue with supportive care   ESRD (end stage renal disease) (HCC) Patient's dialysis session yesterday was shortened causing pulmonary edema today.  - Nephrology consulted; appreciate their recommendations -Continue with routine Monday,  Wednesday and Friday dialysis  Anemia due to chronic kidney disease Hemoglobin today 6.7 compared to her baseline around 8.  Given she is hypervolemic, I question hemodilution and so we will hold off on transfusion for now.  - Repeat CBC with hemoglobin of 7.9 -Continue to monitor  Hypertension Blood pressure elevated,  -Increasing the dose of hydralazine to 75 mg 3 times daily - Continue rest of home regimen  Type 2 diabetes mellitus with other diabetic kidney complication (HCC) - SSI very sensitive  Hyperkalemia Resolved.  Idiopathic peripheral neuropathy - Continue home gabapentin  Facial twitching Patient receives intermittent Xeo injections and is on Sinemet for facial twitching with dystonia.  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  - Continue home Sinemet   Consultants: Nephrology Procedures performed: Hemodialysis Disposition: Home Diet recommendation:  Discharge Diet Orders (From admission, onward)     Start     Ordered   08/13/22 0000  Diet - low sodium heart healthy        08/13/22 1027           Cardiac and Carb modified diet DISCHARGE MEDICATION: Allergies as of 08/13/2022       Reactions   Nsaids Other (See Comments)   Cannot take due to Kidney disease/Kidney function Other Reaction(s): Unknown   Tolmetin Other (See Comments)   Cannot take due to Kidney Disease        Medication List     STOP taking these medications    Xeomin 50 units Solr injection Generic drug: incobotulinumtoxinA       TAKE these medications    acetaminophen 500 MG tablet Commonly known as: TYLENOL Take 1,000 mg by mouth every 6 (six) hours as needed for moderate pain.   allopurinol 100 MG tablet Commonly known as: ZYLOPRIM Take 100 mg by mouth at bedtime.   ALPRAZolam 1 MG tablet Commonly known as: XANAX Take 1 mg by mouth 3 (three) times daily as needed for anxiety or sleep.   amLODipine 10 MG tablet Commonly known as: NORVASC Take 1 tablet (10 mg total) by  mouth daily.   ascorbic acid 500 MG tablet Commonly known as: VITAMIN C Take 1 tablet (500 mg total) by mouth 2 (two) times daily.   benzonatate 200 MG capsule Commonly known as: TESSALON Take 1 capsule (200 mg total) by mouth 3 (three) times daily as needed for cough. What changed:  medication strength how much to take when to take this   carbidopa-levodopa 25-100 MG tablet Commonly known as: SINEMET IR TAKE 1 TABLET BY MOUTH THREE TIMES DAILY   carvedilol 25 MG tablet Commonly known as: COREG Take 1 tablet (25 mg total) by mouth 2 (two) times daily with a meal.   chlorpheniramine-HYDROcodone 10-8 MG/5ML Commonly known as: TUSSIONEX Take 5 mLs by mouth every 12 (twelve) hours as needed for cough.   Diclofenac Sodium 1 % Crea 1 gram qid prn   DULoxetine 30 MG capsule Commonly known as: Cymbalta Take 1 capsule (30 mg total) by mouth daily.   ezetimibe 10 MG tablet Commonly known as: ZETIA Take 10 mg by mouth daily.   gabapentin 100 MG capsule Commonly known  as: NEURONTIN TAKE 3 CAPSULES(300 MG) BY MOUTH THREE TIMES DAILY   hydrALAZINE 25 MG tablet Commonly known as: APRESOLINE Take 3 tablets (75 mg total) by mouth every 8 (eight) hours. What changed:  medication strength how much to take   hydrocortisone 2.5 % rectal cream Commonly known as: Anusol-HC Place 1 Application rectally 2 (two) times daily.   hydrOXYzine 25 MG tablet Commonly known as: ATARAX Take 25 mg by mouth every 6 (six) hours as needed for itching.   isosorbide mononitrate 60 MG 24 hr tablet Commonly known as: IMDUR Take 1 tablet (60 mg total) by mouth every evening. Skip the dose if systolic BP less than 140 mmHg   lanthanum 1000 MG chewable tablet Commonly known as: FOSRENOL Chew 1,000-2,000 mg by mouth See admin instructions. Take 2 tablets (2000mg ) by mouth three times daily with meals and take 1 tablet (1000mg ) by mouth twice daily with snacks   levofloxacin 500 MG tablet Commonly  known as: LEVAQUIN Take 1 tablet (500 mg total) by mouth every other day for 2 doses. Take 1 on 8/10 and second on 8/12   lidocaine-prilocaine cream Commonly known as: EMLA 1gram tid prn   multivitamin Tabs tablet Take 1 tablet by mouth at bedtime.   omeprazole 40 MG capsule Commonly known as: PRILOSEC Take 40 mg by mouth daily.   ondansetron 4 MG tablet Commonly known as: ZOFRAN Take 4 mg by mouth 2 (two) times daily as needed for nausea or vomiting.   polyethylene glycol 17 g packet Commonly known as: MiraLax Take 17 g by mouth daily. What changed:  when to take this reasons to take this   promethazine 25 MG tablet Commonly known as: PHENERGAN Take 25 mg by mouth 3 (three) times daily as needed for nausea or vomiting.   rOPINIRole 1 MG tablet Commonly known as: REQUIP Take 1 tablet (1 mg total) by mouth at bedtime.   vortioxetine HBr 10 MG Tabs tablet Commonly known as: TRINTELLIX Take 10 mg by mouth daily.   zinc sulfate 220 (50 Zn) MG capsule Take 1 capsule (220 mg total) by mouth daily.   zolpidem 5 MG tablet Commonly known as: AMBIEN Take 5 mg by mouth at bedtime.        Follow-up Information     Rick Duff, PA-C. Schedule an appointment as soon as possible for a visit in 1 week(s).   Specialty: Physician Assistant Contact information: [address] [place] [address] [phone]                Discharge Exam: Ceasar Mons Weights   08/12/22 1626 08/12/22 2027 08/13/22 5621  Weight: 111.2 kg 109.3 kg 105.5 kg   General.  Obese lady, in no acute distress. Pulmonary.  Lungs clear bilaterally, normal respiratory effort. CV.  Regular rate and rhythm, no JVD, rub or murmur. Abdomen.  Soft, nontender, nondistended, BS positive. CNS.  Alert and oriented .  No focal neurologic deficit. Extremities.  No edema, no cyanosis, pulses intact and symmetrical. Psychiatry.  Judgment and insight appears normal.   Condition at discharge:  stable  The results of significant diagnostics from this hospitalization (including imaging, microbiology, ancillary and laboratory) are listed below for reference.   Imaging Studies: DG Chest Port 1 View  Result Date: 08/11/2022 CLINICAL DATA:  Shortness of breath EXAM: PORTABLE CHEST 1 VIEW COMPARISON:  08/09/2022 FINDINGS: Heart size remains mildly enlarged. Mild pulmonary vascular congestion. Improved aeration within the right lung compared to prior. No  new focal airspace consolidation. No pleural effusion or pneumothorax. Right axillary vascular stent. IMPRESSION: 1. Mild cardiomegaly and pulmonary vascular congestion. 2. Improved aeration within the right lung compared to prior. Electronically Signed   By: Duanne Guess D.O.   On: 08/11/2022 10:15   DG Chest 2 View  Result Date: 08/09/2022 CLINICAL DATA:  Chest pain, cough, and shortness of breath. EXAM: CHEST - 2 VIEW COMPARISON:  Chest x-ray dated July 28, 2022. CT chest dated July 23, 2022. FINDINGS: Unchanged borderline cardiomegaly. New ill-defined hazy opacity in the right upper and lower lobes. Trace right pleural effusion. No pneumothorax. No acute osseous abnormality. IMPRESSION: 1. New ill-defined hazy opacity in the right upper and lower lobes, favor asymmetric pulmonary edema. 2. Trace right pleural effusion. Electronically Signed   By: Obie Dredge M.D.   On: 08/09/2022 11:17   DG Chest 2 View  Result Date: 07/28/2022 CLINICAL DATA:  Shortness of breath. EXAM: CHEST - 2 VIEW COMPARISON:  07/23/2022. FINDINGS: Improved aeration of the lungs. Low lung volumes accentuate the pulmonary vasculature and cardiomediastinal silhouette. No consolidation or pulmonary edema. No pleural effusion or pneumothorax. IMPRESSION: Low lung volumes without evidence of acute cardiopulmonary disease. Electronically Signed   By: Orvan Falconer M.D.   On: 07/28/2022 13:08   ECHOCARDIOGRAM COMPLETE  Result Date: 07/24/2022    ECHOCARDIOGRAM REPORT    Patient Name:   Patricia [name] Date of Exam: 07/23/2022 Medical Rec #:  [id number]                 Height:       67.0 in Accession #:    [id number]                Weight:       250.4 lb Date of Birth:  04-07-1975                 BSA:          2.225 m Patient Age:    46 years                  BP:           157/81 mmHg Patient Gender: F                         HR:           84 bpm. Exam Location:  [facility] Procedure: 2D Echo Indications:     Dyspnea R06.00  History:         Patient has prior history of Echocardiogram examinations.  Sonographer:     Elwin Sleight RDCS Referring Phys:  [id number] [facility] QIHK Diagnosing Phys: Lorine Bears MD IMPRESSIONS  1. Left ventricular ejection fraction, by estimation, is 60 to 65%. The left ventricle has normal function. The left ventricle has no regional wall motion abnormalities. Left ventricular diastolic parameters are indeterminate.  2. Right ventricular systolic function is normal. The right ventricular size is normal. There is severely elevated pulmonary artery systolic pressure. The estimated right ventricular systolic pressure is 60.8 mmHg.  3. Left atrial size was moderately dilated.  4. Right atrial size was mildly dilated.  5. The mitral valve is normal in structure. Mild mitral valve regurgitation. No evidence of mitral stenosis.  6. Tricuspid valve regurgitation is mild to moderate.  7. The aortic valve is normal in structure. Aortic valve regurgitation is not visualized. No aortic stenosis is present.  8. The inferior  vena cava is normal in size with greater than 50% respiratory variability, suggesting right atrial pressure of 3 mmHg. FINDINGS  Left Ventricle: Left ventricular ejection fraction, by estimation, is 60 to 65%. The left ventricle has normal function. The left ventricle has no regional wall motion abnormalities. The left ventricular internal cavity size was normal in size. There is  no left ventricular hypertrophy. Left ventricular diastolic  parameters are indeterminate. Right Ventricle: The right ventricular size is normal. No increase in right ventricular wall thickness. Right ventricular systolic function is normal. There is severely elevated pulmonary artery systolic pressure. The tricuspid regurgitant velocity is 3.80 m/s, and with an assumed right atrial pressure of 3 mmHg, the estimated right ventricular systolic pressure is 60.8 mmHg. Left Atrium: Left atrial size was moderately dilated. Right Atrium: Right atrial size was mildly dilated. Pericardium: There is no evidence of pericardial effusion. Mitral Valve: The mitral valve is normal in structure. Mild mitral valve regurgitation. No evidence of mitral valve stenosis. Tricuspid Valve: The tricuspid valve is normal in structure. Tricuspid valve regurgitation is mild to moderate. No evidence of tricuspid stenosis. Aortic Valve: The aortic valve is normal in structure. Aortic valve regurgitation is not visualized. No aortic stenosis is present. Aortic valve peak gradient measures 9.4 mmHg. Pulmonic Valve: The pulmonic valve was normal in structure. Pulmonic valve regurgitation is trivial. No evidence of pulmonic stenosis. Aorta: The aortic root is normal in size and structure. Venous: The inferior vena cava is normal in size with greater than 50% respiratory variability, suggesting right atrial pressure of 3 mmHg. IAS/Shunts: No atrial level shunt detected by color flow Doppler.  LEFT VENTRICLE PLAX 2D LVIDd:         4.90 cm   Diastology LVIDs:         3.10 cm   LV e' medial:    9.90 cm/s LV PW:         1.00 cm   LV E/e' medial:  9.2 LV IVS:        1.10 cm   LV e' lateral:   10.80 cm/s LVOT diam:     2.20 cm   LV E/e' lateral: 8.5 LV SV:         92 LV SV Index:   41 LVOT Area:     3.80 cm  RIGHT VENTRICLE RV Basal diam:  4.10 cm RV S prime:     17.30 cm/s TAPSE (M-mode): 2.6 cm LEFT ATRIUM              Index        RIGHT ATRIUM           Index LA diam:        4.80 cm  2.16 cm/m   RA Area:      20.10 cm LA Vol (A2C):   110.0 ml 49.44 ml/m  RA Volume:   63.70 ml  28.63 ml/m LA Vol (A4C):   100.0 ml 44.94 ml/m LA Biplane Vol: 106.0 ml 47.64 ml/m  AORTIC VALVE                 PULMONIC VALVE AV Area (Vmax): 2.91 cm     PV Vmax:        1.09 m/s AV Vmax:        153.00 cm/s  PV Peak grad:   4.8 mmHg AV Peak Grad:   9.4 mmHg     RVOT Peak grad: 1 mmHg LVOT Vmax:      117.00 cm/s  LVOT Vmean:     80.300 cm/s LVOT VTI:       0.241 m  AORTA Ao Root diam: 3.20 cm Ao Asc diam:  3.40 cm MITRAL VALVE               TRICUSPID VALVE MV Area (PHT): 4.06 cm    TR Peak grad:   57.8 mmHg MV Decel Time: 187 msec    TR Vmax:        380.00 cm/s MV E velocity: 91.50 cm/s MV A velocity: 97.30 cm/s  SHUNTS MV E/A ratio:  0.94        Systemic VTI:  0.24 m                            Systemic Diam: 2.20 cm Lorine Bears MD Electronically signed by Lorine Bears MD Signature Date/Time: 07/24/2022/1:16:26 PM    Final    CT CHEST WO CONTRAST  Result Date: 07/23/2022 CLINICAL DATA:  Shortness of breath EXAM: CT CHEST WITHOUT CONTRAST TECHNIQUE: Multidetector CT imaging of the chest was performed following the standard protocol without IV contrast. RADIATION DOSE REDUCTION: This exam was performed according to the departmental dose-optimization program which includes automated exposure control, adjustment of the mA and/or kV according to patient size and/or use of iterative reconstruction technique. COMPARISON:  None Available. FINDINGS: Cardiovascular: Cardiomegaly. Trace pericardial effusion. Normal caliber thoracic aorta with mild atherosclerotic disease. Severe coronary artery calcifications. Mediastinum/Nodes: Small hiatal hernia. Thyroid is unremarkable. Mildly enlarged mediastinal and hilar lymph nodes, likely reactive. Lungs/Pleura: Central airways are patent. Bilateral central predominant patchy ground-glass opacities with associated interlobular septal thickening. Small right-greater-than-left pleural effusions. Upper  Abdomen: No acute abnormality. Musculoskeletal: No chest wall mass or suspicious bone lesions identified. IMPRESSION: 1. Bilateral central predominant patchy ground-glass opacities with associated interlobular septal thickening, consistent with pulmonary edema. 2. Small right-greater-than-left pleural effusions. 3. Cardiomegaly. 4. Coronary artery calcifications and aortic Atherosclerosis (ICD10-I70.0). Electronically Signed   By: Allegra Lai M.D.   On: 07/23/2022 16:12   DG Chest Port 1 View  Result Date: 07/23/2022 CLINICAL DATA:  10031 Cough 10031 EXAM: PORTABLE CHEST 1 VIEW COMPARISON:  CXR 07/20/22 FINDINGS: No pleural effusion. No pneumothorax. Interval increase in conspicuity of bilateral patchy airspace opacities. Borderline cardiomegaly. No radiographically apparent displaced rib fractures. Visualized upper abdomen is notable for enteric contrast opacify splenic flexure. IMPRESSION: Interval increase in conspicuity of bilateral patchy airspace opacities, concerning for multifocal pneumonia. Electronically Signed   By: Lorenza Cambridge M.D.   On: 07/23/2022 10:38   NM Pulmonary Perfusion  Result Date: 07/23/2022 CLINICAL DATA:  Dyspnea, elevated D-dimer EXAM: NUCLEAR MEDICINE PERFUSION LUNG SCAN TECHNIQUE: Perfusion images were obtained in multiple projections after intravenous injection of radiopharmaceutical. Ventilation scans intentionally deferred if perfusion scan and chest x-ray adequate for interpretation during COVID 19 epidemic. RADIOPHARMACEUTICALS:  4.37 mCi Tc-80m MAA IV COMPARISON:  Same day chest radiograph FINDINGS: Cardiomegaly. Homogeneous perfusion of the lungs. No suspicious perfusion defect. IMPRESSION: 1. Very low probability for pulmonary embolism by modified perfusion only PIOPED criteria (PE absent). 2.  Cardiomegaly. Electronically Signed   By: Jearld Lesch M.D.   On: 07/23/2022 10:36   DG Chest 2 View  Result Date: 07/20/2022 CLINICAL DATA:  Provided history: Painful  cough. Additional history provided: Cough. Wheezing. Chills. EXAM: CHEST - 2 VIEW COMPARISON:  Prior chest radiographs 07/15/2022 and earlier. FINDINGS: Heart size within normal limits. Patchy and hazy pulmonary opacities bilaterally, new from the  prior examination of 07/15/2022. Chronic elevation of the right hemidiaphragm. No evidence of pleural effusion or pneumothorax. No acute osseous abnormality identified. Dextrocurvature of the imaged thoracolumbar spine. A vascular stent projects in the right subclavian/axillary region. Residual contrast within the colon. IMPRESSION: 1. Patchy and hazy pulmonary opacities bilaterally, new from the prior examination of 07/15/2022. Findings may reflect infection and/or edema. 2. Chronic elevation of the right hemidiaphragm. 3. Dextrocurvature of the imaged thoracolumbar spine. Electronically Signed   By: Jackey Loge D.O.   On: 07/20/2022 11:24   DG Chest 2 View  Result Date: 07/15/2022 CLINICAL DATA:  Shortness of breath, chest pain EXAM: CHEST - 2 VIEW COMPARISON:  05/11/2022 FINDINGS: The heart size and mediastinal contours are within normal limits. Both lungs are clear. The visualized skeletal structures are unremarkable. IMPRESSION: No active cardiopulmonary disease. Electronically Signed   By: Charlett Nose M.D.   On: 07/15/2022 21:35    Microbiology: Results for orders placed or performed during the hospital encounter of 08/09/22  SARS Coronavirus 2 by RT PCR (hospital order, performed in [facility] hospital lab) *cepheid single result test* Anterior Nasal Swab     Status: Abnormal   Collection Time: 08/09/22  2:02 PM   Specimen: Anterior Nasal Swab  Result Value Ref Range Status   SARS Coronavirus 2 by RT PCR POSITIVE (A) NEGATIVE Final    Comment: (NOTE) SARS-CoV-2 target nucleic acids are DETECTED  SARS-CoV-2 RNA is generally detectable in upper respiratory specimens  during the acute phase of infection.  Positive results are indicative  of the  presence of the identified virus, but do not rule out bacterial infection or co-infection with other pathogens not detected by the test.  Clinical correlation with patient history and  other diagnostic information is necessary to determine patient infection status.  The expected result is negative.  Fact Sheet for Patients:   RoadLapTop.co.za   Fact Sheet for Healthcare Providers:   http://kim-miller.com/    This test is not yet approved or cleared by the [place] FDA and  has been authorized for detection and/or diagnosis of SARS-CoV-2 by FDA under an Emergency Use Authorization (EUA).  This EUA will remain in effect (meaning this test can be used) for the duration of  the COVID-19 declaration under Section 564(b)(1)  of the Act, 21 U.S.C. section 360-bbb-3(b)(1), unless the authorization is terminated or revoked sooner.   Performed at [facility], [address] [place], [place] [address]     Labs: CBC: Recent Labs  Lab 08/09/22 1026 08/09/22 1451 08/10/22 1003 08/11/22 0535 08/12/22 1617  WBC 7.2 7.5 8.2 5.9 4.9  HGB 6.7* 6.5* 7.9* 7.6* 7.3*  HCT 22.0* 20.9* 24.6* 24.4* 23.6*  MCV 84.3 82.3 80.1 81.6 81.1  PLT 262 243 253 243 272   Basic Metabolic Panel: Recent Labs  Lab 08/09/22 1026 08/09/22 1451 08/10/22 1003 08/11/22 0535 08/12/22 1617 08/12/22 2208  NA 142 142 135 134* 132*  --   K 6.2* 5.5* 5.4* 4.5 6.5* 4.6  CL 100 100 94* 94* 93*  --   CO2 28 28 28 29 25   --   GLUCOSE 100* 93 98 94 132*  --   BUN 55* 59* 38* 24* 50*  --   CREATININE 9.25* 9.51* 7.17* 5.23* 8.95*  --   CALCIUM 9.2 9.4 9.2 9.1 9.1  --   PHOS  --  3.4 4.4  --  5.8*  --    Liver Function Tests: Recent Labs  Lab  08/09/22 1451 08/10/22 1003 08/11/22 0535 08/12/22 1617  AST  --   --  27  --   ALT  --   --  <5  --   ALKPHOS  --   --  72  --   BILITOT  --   --  0.7  --   PROT  --   --  7.2  --   ALBUMIN 3.5 3.2* 3.5  3.7   CBG: Recent Labs  Lab 08/12/22 0801 08/12/22 1138 08/12/22 1610 08/12/22 2128 08/13/22 0747  GLUCAP 122* 113* 130* 147* 119*    Discharge time spent: greater than 30 minutes.  This record has been created using Conservation officer, historic buildings. Errors have been sought and corrected,but may not always be located. Such creation errors do not reflect on the standard of care.   Signed: Arnetha Courser, MD [facility] 08/13/2022

## 2022-08-13 NOTE — TOC Transition Note (Signed)
Transition of Care [facility]) - CM/SW Discharge Note   Patient Details  Name: Patricia [name] MRN: [id number] Date of Birth: 04-30-1975  Transition of Care [facility]) CM/SW Contact:  Allena Katz, LCSW Phone Number: 08/13/2022, 11:13 AM   Clinical Narrative:   Pt discharging home pt reports she would like HH. [facility] unable to accept. [facility] can take for [facility] PT. CSW signing off.           Patient Goals and CMS Choice      Discharge Placement                         Discharge Plan and Services Additional resources added to the After Visit Summary for                                       Social Determinants of Health (SDOH) Interventions SDOH Screenings   Food Insecurity: No Food Insecurity (08/09/2022)  Housing: Low Risk  (08/09/2022)  Transportation Needs: No Transportation Needs (08/09/2022)  Utilities: Not At Risk (08/09/2022)  Financial Resource Strain: Low Risk  (09/04/2019)   Received from [facility], [facility]  Physical Activity: Insufficiently Active (09/04/2019)   Received from [facility], [facility]  Social Connections: Unknown (05/10/2021)   Received from [facility], [facility]  Stress: Stress Concern Present (09/04/2019)   Received from [facility], [facility]  Tobacco Use: Low Risk  (08/09/2022)  Health Literacy: Low Risk  (07/04/2019)   Received from [facility], [facility]     Readmission Risk Interventions    08/12/2022    9:33 AM 07/23/2022    9:17 AM 05/13/2022    1:57 PM  Readmission Risk Prevention Plan  Transportation Screening Complete Complete   Medication Review (RN Care Manager) Complete Complete   PCP or Specialist appointment within 3-5 days of discharge Complete  --  HRI or Home Care Consult Complete  Not Complete  HRI or Home Care Consult Pt Refusal Comments   NA  SW Recovery Care/Counseling Consult  Complete   Palliative Care Screening  Not Applicable   Skilled Nursing Facility Not  Applicable Not Applicable

## 2022-08-17 ENCOUNTER — Ambulatory Visit: Payer: Medicare Other | Admitting: Podiatry

## 2022-08-24 ENCOUNTER — Emergency Department: Payer: Medicare Other

## 2022-08-24 ENCOUNTER — Other Ambulatory Visit: Payer: Self-pay

## 2022-08-24 ENCOUNTER — Observation Stay: Payer: Medicare Other

## 2022-08-24 ENCOUNTER — Observation Stay
Admission: EM | Admit: 2022-08-24 | Discharge: 2022-08-26 | Disposition: A | Discharge status: Home / Self Care | Payer: Medicare Other | Attending: Internal Medicine | Admitting: Internal Medicine

## 2022-08-24 DIAGNOSIS — R0602 Shortness of breath: Secondary | ICD-10-CM | POA: Insufficient documentation

## 2022-08-24 DIAGNOSIS — R4182 Altered mental status, unspecified: Secondary | ICD-10-CM | POA: Insufficient documentation

## 2022-08-24 DIAGNOSIS — K219 Gastro-esophageal reflux disease without esophagitis: Secondary | ICD-10-CM | POA: Diagnosis present

## 2022-08-24 DIAGNOSIS — E875 Hyperkalemia: Secondary | ICD-10-CM | POA: Insufficient documentation

## 2022-08-24 DIAGNOSIS — E785 Hyperlipidemia, unspecified: Secondary | ICD-10-CM | POA: Diagnosis present

## 2022-08-24 DIAGNOSIS — Z992 Dependence on renal dialysis: Secondary | ICD-10-CM | POA: Insufficient documentation

## 2022-08-24 DIAGNOSIS — M1A00X0 Idiopathic chronic gout, unspecified site, without tophus (tophi): Secondary | ICD-10-CM | POA: Diagnosis present

## 2022-08-24 DIAGNOSIS — I5030 Unspecified diastolic (congestive) heart failure: Secondary | ICD-10-CM | POA: Diagnosis not present

## 2022-08-24 DIAGNOSIS — R2990 Unspecified symptoms and signs involving the nervous system: Secondary | ICD-10-CM

## 2022-08-24 DIAGNOSIS — Z1152 Encounter for screening for COVID-19: Secondary | ICD-10-CM | POA: Insufficient documentation

## 2022-08-24 DIAGNOSIS — E1129 Type 2 diabetes mellitus with other diabetic kidney complication: Secondary | ICD-10-CM | POA: Diagnosis present

## 2022-08-24 DIAGNOSIS — Z79899 Other long term (current) drug therapy: Secondary | ICD-10-CM | POA: Insufficient documentation

## 2022-08-24 DIAGNOSIS — M1A9XX0 Chronic gout, unspecified, without tophus (tophi): Secondary | ICD-10-CM | POA: Diagnosis present

## 2022-08-24 DIAGNOSIS — R0789 Other chest pain: Secondary | ICD-10-CM | POA: Diagnosis present

## 2022-08-24 DIAGNOSIS — D638 Anemia in other chronic diseases classified elsewhere: Secondary | ICD-10-CM

## 2022-08-24 DIAGNOSIS — R079 Chest pain, unspecified: Secondary | ICD-10-CM | POA: Diagnosis present

## 2022-08-24 DIAGNOSIS — N186 End stage renal disease: Secondary | ICD-10-CM | POA: Diagnosis not present

## 2022-08-24 DIAGNOSIS — I5032 Chronic diastolic (congestive) heart failure: Secondary | ICD-10-CM | POA: Diagnosis not present

## 2022-08-24 DIAGNOSIS — E1169 Type 2 diabetes mellitus with other specified complication: Secondary | ICD-10-CM | POA: Diagnosis not present

## 2022-08-24 DIAGNOSIS — G9340 Encephalopathy, unspecified: Principal | ICD-10-CM | POA: Diagnosis present

## 2022-08-24 DIAGNOSIS — E1122 Type 2 diabetes mellitus with diabetic chronic kidney disease: Secondary | ICD-10-CM | POA: Insufficient documentation

## 2022-08-24 DIAGNOSIS — R4781 Slurred speech: Secondary | ICD-10-CM | POA: Diagnosis not present

## 2022-08-24 DIAGNOSIS — Z8673 Personal history of transient ischemic attack (TIA), and cerebral infarction without residual deficits: Secondary | ICD-10-CM | POA: Diagnosis not present

## 2022-08-24 DIAGNOSIS — I10 Essential (primary) hypertension: Secondary | ICD-10-CM | POA: Diagnosis present

## 2022-08-24 DIAGNOSIS — D649 Anemia, unspecified: Secondary | ICD-10-CM | POA: Diagnosis present

## 2022-08-24 DIAGNOSIS — E669 Obesity, unspecified: Secondary | ICD-10-CM | POA: Insufficient documentation

## 2022-08-24 LAB — CBC
HCT: 27.1 % — ABNORMAL LOW (ref 36.0–46.0)
Hemoglobin: 8.4 g/dL — ABNORMAL LOW (ref 12.0–15.0)
MCH: 24.8 pg — ABNORMAL LOW (ref 26.0–34.0)
MCHC: 31 g/dL (ref 30.0–36.0)
MCV: 79.9 fL — ABNORMAL LOW (ref 80.0–100.0)
Platelets: 309 10*3/uL (ref 150–400)
RBC: 3.39 MIL/uL — ABNORMAL LOW (ref 3.87–5.11)
RDW: 18.9 % — ABNORMAL HIGH (ref 11.5–15.5)
WBC: 7.4 10*3/uL (ref 4.0–10.5)
nRBC: 0.7 % — ABNORMAL HIGH (ref 0.0–0.2)

## 2022-08-24 LAB — COMPREHENSIVE METABOLIC PANEL
ALT: 11 U/L (ref 0–44)
AST: 26 U/L (ref 15–41)
Albumin: 3.3 g/dL — ABNORMAL LOW (ref 3.5–5.0)
Alkaline Phosphatase: 72 U/L (ref 38–126)
Anion gap: 17 — ABNORMAL HIGH (ref 5–15)
BUN: 42 mg/dL — ABNORMAL HIGH (ref 6–20)
CO2: 23 mmol/L (ref 22–32)
Calcium: 8.3 mg/dL — ABNORMAL LOW (ref 8.9–10.3)
Chloride: 96 mmol/L — ABNORMAL LOW (ref 98–111)
Creatinine, Ser: 5.55 mg/dL — ABNORMAL HIGH (ref 0.44–1.00)
GFR, Estimated: 9 mL/min — ABNORMAL LOW (ref >60)
Glucose, Bld: 87 mg/dL (ref 70–99)
Potassium: 4.4 mmol/L (ref 3.5–5.1)
Sodium: 136 mmol/L (ref 135–145)
Total Bilirubin: 0.4 mg/dL (ref 0.3–1.2)
Total Protein: 7.1 g/dL (ref 6.5–8.1)

## 2022-08-24 LAB — RESP PANEL BY RT-PCR (RSV, FLU A&B, COVID)  RVPGX2
Influenza A by PCR: NEGATIVE
Influenza B by PCR: NEGATIVE
Resp Syncytial Virus by PCR: NEGATIVE
SARS Coronavirus 2 by RT PCR: NEGATIVE

## 2022-08-24 LAB — PROTIME-INR
INR: 1.1 (ref 0.8–1.2)
Prothrombin Time: 14.6 seconds (ref 11.4–15.2)

## 2022-08-24 LAB — BASIC METABOLIC PANEL
Anion gap: 16 — ABNORMAL HIGH (ref 5–15)
BUN: 42 mg/dL — ABNORMAL HIGH (ref 6–20)
CO2: 25 mmol/L (ref 22–32)
Calcium: 8.2 mg/dL — ABNORMAL LOW (ref 8.9–10.3)
Chloride: 95 mmol/L — ABNORMAL LOW (ref 98–111)
Creatinine, Ser: 5.52 mg/dL — ABNORMAL HIGH (ref 0.44–1.00)
GFR, Estimated: 9 mL/min — ABNORMAL LOW (ref >60)
Glucose, Bld: 87 mg/dL (ref 70–99)
Potassium: 4.4 mmol/L (ref 3.5–5.1)
Sodium: 136 mmol/L (ref 135–145)

## 2022-08-24 LAB — CBG MONITORING, ED: Glucose-Capillary: 77 mg/dL (ref 70–99)

## 2022-08-24 LAB — BLOOD GAS, VENOUS
Acid-Base Excess: 7.3 mmol/L — ABNORMAL HIGH (ref 0.0–2.0)
Bicarbonate: 34.3 mmol/L — ABNORMAL HIGH (ref 20.0–28.0)
O2 Saturation: 80.6 %
Patient temperature: 37
pCO2, Ven: 58 mmHg (ref 44–60)
pH, Ven: 7.38 (ref 7.25–7.43)
pO2, Ven: 49 mmHg — ABNORMAL HIGH (ref 32–45)

## 2022-08-24 LAB — CBC WITH DIFFERENTIAL/PLATELET
Abs Immature Granulocytes: 0.03 10*3/uL (ref 0.00–0.07)
Basophils Absolute: 0.1 10*3/uL (ref 0.0–0.1)
Basophils Relative: 1 %
Eosinophils Absolute: 0.3 10*3/uL (ref 0.0–0.5)
Eosinophils Relative: 4 %
HCT: 28.4 % — ABNORMAL LOW (ref 36.0–46.0)
Hemoglobin: 8.5 g/dL — ABNORMAL LOW (ref 12.0–15.0)
Immature Granulocytes: 0 %
Lymphocytes Relative: 21 %
Lymphs Abs: 1.5 10*3/uL (ref 0.7–4.0)
MCH: 24.7 pg — ABNORMAL LOW (ref 26.0–34.0)
MCHC: 29.9 g/dL — ABNORMAL LOW (ref 30.0–36.0)
MCV: 82.6 fL (ref 80.0–100.0)
Monocytes Absolute: 0.4 10*3/uL (ref 0.1–1.0)
Monocytes Relative: 6 %
Neutro Abs: 4.8 10*3/uL (ref 1.7–7.7)
Neutrophils Relative %: 68 %
Platelets: 330 10*3/uL (ref 150–400)
RBC: 3.44 MIL/uL — ABNORMAL LOW (ref 3.87–5.11)
RDW: 18.9 % — ABNORMAL HIGH (ref 11.5–15.5)
WBC: 7.1 10*3/uL (ref 4.0–10.5)
nRBC: 0.7 % — ABNORMAL HIGH (ref 0.0–0.2)

## 2022-08-24 LAB — APTT: aPTT: 36 seconds (ref 24–36)

## 2022-08-24 LAB — TROPONIN I (HIGH SENSITIVITY)
Troponin I (High Sensitivity): 8 ng/L (ref <18)
Troponin I (High Sensitivity): 8 ng/L (ref <18)

## 2022-08-24 LAB — ETHANOL: Alcohol, Ethyl (B): <10 mg/dL (ref <10)

## 2022-08-24 LAB — GLUCOSE, CAPILLARY: Glucose-Capillary: 128 mg/dL — ABNORMAL HIGH (ref 70–99)

## 2022-08-24 MED ORDER — VORTIOXETINE HBR 5 MG PO TABS
10.0000 mg | ORAL_TABLET | Freq: Every day | ORAL | Status: DC
  Administered 2022-08-25 – 2022-08-26 (×2): 10 mg via ORAL
  Filled 2022-08-24 (×2): qty 2

## 2022-08-24 MED ORDER — BENZONATATE 100 MG PO CAPS: 200.0000 mg | ORAL_CAPSULE | Freq: Three times a day (TID) | ORAL | Status: DC | PRN

## 2022-08-24 MED ORDER — SODIUM CHLORIDE 0.9% FLUSH: 3.0000 mL | INTRAVENOUS | Status: DC | PRN

## 2022-08-24 MED ORDER — INSULIN ASPART 100 UNIT/ML IJ SOLN: 0.0000 [IU] | Freq: Three times a day (TID) | INTRAMUSCULAR | Status: DC

## 2022-08-24 MED ORDER — ONDANSETRON HCL 4 MG PO TABS
4.0000 mg | ORAL_TABLET | Freq: Four times a day (QID) | ORAL | Status: DC | PRN
  Administered 2022-08-25: 4 mg via ORAL
  Filled 2022-08-24: qty 1

## 2022-08-24 MED ORDER — IOHEXOL 350 MG/ML SOLN
100.0000 mL | Freq: Once | INTRAVENOUS | Status: AC | PRN
  Administered 2022-08-24: 100 mL via INTRAVENOUS

## 2022-08-24 MED ORDER — HEPARIN SODIUM (PORCINE) 5000 UNIT/ML IJ SOLN
5000.0000 [IU] | Freq: Three times a day (TID) | INTRAMUSCULAR | Status: DC
  Administered 2022-08-24 – 2022-08-26 (×6): 5000 [IU] via SUBCUTANEOUS
  Filled 2022-08-24 (×6): qty 1

## 2022-08-24 MED ORDER — ONDANSETRON HCL 4 MG/2ML IJ SOLN
4.0000 mg | Freq: Four times a day (QID) | INTRAMUSCULAR | Status: DC | PRN
  Administered 2022-08-24 – 2022-08-26 (×3): 4 mg via INTRAVENOUS
  Filled 2022-08-24: qty 2

## 2022-08-24 MED ORDER — LANTHANUM CARBONATE 500 MG PO CHEW
1000.0000 mg | CHEWABLE_TABLET | Freq: Two times a day (BID) | ORAL | Status: DC | PRN
  Filled 2022-08-24: qty 2

## 2022-08-24 MED ORDER — AMLODIPINE BESYLATE 10 MG PO TABS
10.0000 mg | ORAL_TABLET | Freq: Every day | ORAL | Status: DC
  Administered 2022-08-25 – 2022-08-26 (×2): 10 mg via ORAL
  Filled 2022-08-24 (×2): qty 1

## 2022-08-24 MED ORDER — LORAZEPAM 2 MG/ML IJ SOLN
2.0000 mg | Freq: Once | INTRAMUSCULAR | Status: AC | PRN
  Administered 2022-08-24: 2 mg via INTRAVENOUS
  Filled 2022-08-24: qty 1

## 2022-08-24 MED ORDER — CARVEDILOL 25 MG PO TABS
25.0000 mg | ORAL_TABLET | Freq: Two times a day (BID) | ORAL | Status: DC
  Administered 2022-08-24 – 2022-08-26 (×4): 25 mg via ORAL
  Filled 2022-08-24: qty 4
  Filled 2022-08-24 (×3): qty 1

## 2022-08-24 MED ORDER — HYDRALAZINE HCL 50 MG PO TABS
75.0000 mg | ORAL_TABLET | Freq: Three times a day (TID) | ORAL | Status: DC
  Administered 2022-08-24 – 2022-08-26 (×5): 75 mg via ORAL
  Filled 2022-08-24 (×5): qty 2

## 2022-08-24 MED ORDER — SODIUM CHLORIDE 0.9 % IV SOLN: 250.0000 mL | INTRAVENOUS | Status: DC | PRN

## 2022-08-24 MED ORDER — ALLOPURINOL 100 MG PO TABS
100.0000 mg | ORAL_TABLET | Freq: Every day | ORAL | Status: DC
  Administered 2022-08-24 – 2022-08-25 (×2): 100 mg via ORAL
  Filled 2022-08-24 (×3): qty 1

## 2022-08-24 MED ORDER — SODIUM CHLORIDE 0.9% FLUSH
3.0000 mL | Freq: Two times a day (BID) | INTRAVENOUS | Status: DC
  Administered 2022-08-24 – 2022-08-26 (×4): 3 mL via INTRAVENOUS

## 2022-08-24 MED ORDER — LANTHANUM CARBONATE 500 MG PO CHEW
2000.0000 mg | CHEWABLE_TABLET | Freq: Three times a day (TID) | ORAL | Status: DC
  Administered 2022-08-25 – 2022-08-26 (×3): 2000 mg via ORAL
  Filled 2022-08-24 (×6): qty 4

## 2022-08-24 MED ORDER — ALPRAZOLAM 0.5 MG PO TABS
0.5000 mg | ORAL_TABLET | Freq: Every evening | ORAL | Status: DC | PRN
  Administered 2022-08-25 (×2): 0.5 mg via ORAL
  Filled 2022-08-24 (×2): qty 1

## 2022-08-24 MED ORDER — LANTHANUM CARBONATE 500 MG PO CHEW: 1000.0000 mg | CHEWABLE_TABLET | ORAL | Status: DC

## 2022-08-24 MED ORDER — ISOSORBIDE MONONITRATE ER 30 MG PO TB24
60.0000 mg | ORAL_TABLET | Freq: Every evening | ORAL | Status: DC
  Administered 2022-08-25: 60 mg via ORAL
  Filled 2022-08-24: qty 2

## 2022-08-24 MED ORDER — RENA-VITE PO TABS
1.0000 | ORAL_TABLET | Freq: Every day | ORAL | Status: DC
  Administered 2022-08-24 – 2022-08-25 (×2): 1 via ORAL
  Filled 2022-08-24 (×2): qty 1

## 2022-08-24 MED ORDER — PANTOPRAZOLE SODIUM 40 MG PO TBEC
40.0000 mg | DELAYED_RELEASE_TABLET | Freq: Every day | ORAL | Status: DC
  Administered 2022-08-25 – 2022-08-26 (×2): 40 mg via ORAL
  Filled 2022-08-24 (×2): qty 1

## 2022-08-24 MED ORDER — DULOXETINE HCL 30 MG PO CPEP
30.0000 mg | ORAL_CAPSULE | Freq: Every day | ORAL | Status: DC
  Administered 2022-08-25 – 2022-08-26 (×2): 30 mg via ORAL
  Filled 2022-08-24 (×2): qty 1

## 2022-08-24 NOTE — Consult Note (Signed)
Neurology Consultation Reason for Consult: Code stroke Requesting Physician: Willy Eddy   CC: generalized weakness, vomiting, veering to the right, speech difficulty  History is obtained from: Patient and chart review  HPI: Patricia [name] is a 47 y.o. female with a past medical history significant for ESRD on IHD, hypertension, hyperlipidemia, type 2 diabetes, prior GI bleed in 2020, anxiety/depression, dystonia, blepharospasm, restless leg syndrome, painful neuropathy.  She is somewhat inconsistent in her history reporting.  Initially reported all her speech difficulty started during dialysis but then when asked when she was sure she was left speaking normally this was reported as last night when going to bed.    She additionally noted she has been having a lot of nausea and vomiting intermittently and she did vomit this morning at 8 AM.    She feels that her generalized weakness after dialysis is improving at the time of my evaluation in the CT scanner, but that she is continuing to have some intermittent speech difficulty.    She thinks she may have had some left upper extremity numbness for some time but generally seems unsure of the time course of her symptoms.    She notes that she is extremely claustrophobic and cannot tolerate MRI without anxiety medication.  To the stroke response nurse she additionally noted she started with veering towards the right at 2 or 3 PM yesterday and that this initially improved but never fully returned to baseline  She follows with Dr. Terrace Arabia at [facility] for blepharospasm with frequent involuntary eye pulling and blinking movements that started around November 2022.  She had EMG guided Botox injection on 04/23/2022 for this condition  Of note there are multiple psychoactive medications on her outpatient medication list including Zolpidem 5 mg nightly Ropinirole 1 mg nightly Promethazine 25 mg every 8 hours as  needed Pregabalin 75 mg nightly Hydroxyzine 25 mg every 6 hours as needed Gabapentin 100 mg 3 times daily Duloxetine 30 mg daily Carbidopa levodopa 25-100 3 times daily Xanax 1 mg 3 times daily as needed   LKW: Initially reported as 12:07 PM but on clarification started having symptoms at 2 PM yesterday Thrombolytic given?: No, out of the window IA performed?: No, exam not consistent with LVO Premorbid modified rankin scale: 0-2     0 - No symptoms.     1 - No significant disability. Able to carry out all usual activities, despite some symptoms.     2 - Slight disability. Able to look after own affairs without assistance, but unable to carry out all previous activities.   ROS: Unable to obtain due to slow/stuttering speech followed by sleepiness due to Ativan for MRI  Past Medical History:  Diagnosis Date   Acid reflux    Anemia 04/2019   REQUIRING BLOOD TRANSFUSIONS   Anxiety    Arthritis    CHF (congestive heart failure) (HCC)    Dyspnea    with exertion   Dysrhythmia    tachycardia   ESRD (end stage renal disease) (HCC)    [facility] [place]   Facial twitching 11/2020   mouth twitch, eyes roll back, neurology started patient on low dose   Sinemet IR.   GI bleeding 12/2017   Gout    Headache(784.0)    "related to chemo; sometimes weekly" (09/12/2013)   Heart murmur    Systolic per Dr Orpah Cobb' notes   High cholesterol    History of blood transfusion "a couple"   "related to low  counts"   Hypertension    Iron deficiency anemia    "get epogen shots q month" (02/20/2014)   MCGN (minimal change glomerulonephritis)    "using chemo to tx;  finished my last tx in 12/2013"   Neuropathy    Peritoneal dialysis status (HCC)    Stroke (HCC) 08/15/2017   ruled out that it was not a stroke   Past Surgical History:  Procedure Laterality Date   ABDOMINAL HYSTERECTOMY  2010   "laparoscopic"   ANKLE FRACTURE SURGERY Right 1994   AV FISTULA PLACEMENT Left 04/14/2016    Procedure: ARTERIOVENOUS (AV) FISTULA CREATION;  Surgeon: Chuck Hint, MD;  Location: [facility] OR;  Service: Vascular;  Laterality: Left;   AV FISTULA PLACEMENT Left 08/09/2017   Procedure: INSERTION OF ARTERIOVENOUS (AV) GORE-TEX GRAFT LEFT ARM;  Surgeon: Sherren Kerns, MD;  Location: [facility] OR;  Service: Vascular;  Laterality: Left;   AV FISTULA PLACEMENT Right 03/15/2020   Procedure: RIGHT ARM ARTERIOVENOUS (AV) FISTULA CREATION 1st Stage Basilic Transposition;  Surgeon: Nada Libman, MD;  Location: [facility] OR;  Service: Vascular;  Laterality: Right;   AV FISTULA PLACEMENT Right 02/24/2021   Procedure: RIGHT ARM ARTERIOVENOUS  FISTULA CREATION;  Surgeon: Victorino Sparrow, MD;  Location: [facility] OR;  Service: Vascular;  Laterality: Right;  regional block   AVGG REMOVAL Left 08/11/2017   Procedure: REMOVAL OF ARTERIOVENOUS GORETEX GRAFT (AVGG);  Surgeon: Nada Libman, MD;  Location: [facility] OR;  Service: Vascular;  Laterality: Left;   BASCILIC VEIN TRANSPOSITION Right 05/17/2020   Procedure: RIGHT UPPER EXTREMITY SECOND STAGE BASCILIC VEIN TRANSPOSITION;  Surgeon: Nada Libman, MD;  Location: [facility] OR;  Service: Vascular;  Laterality: Right;   BIOPSY  09/03/2017   Procedure: BIOPSY;  Surgeon: Kathi Der, MD;  Location: [facility] ENDOSCOPY;  Service: Gastroenterology;;   BIOPSY  12/24/2017   Procedure: BIOPSY;  Surgeon: Lemar Lofty., MD;  Location: [facility] ENDOSCOPY;  Service: Gastroenterology;;   CARDIAC CATHETERIZATION  2000's   COLONOSCOPY WITH PROPOFOL N/A 09/04/2017   Procedure: COLONOSCOPY WITH PROPOFOL;  Surgeon: Kerin Salen, MD;  Location: [facility] ENDOSCOPY;  Service: Gastroenterology;  Laterality: N/A;   ENTEROSCOPY N/A 12/24/2017   Procedure: ENTEROSCOPY;  Surgeon: Meridee Score Netty Starring., MD;  Location: [facility] ENDOSCOPY;  Service: Gastroenterology;  Laterality: N/A;   ESOPHAGOGASTRODUODENOSCOPY     ESOPHAGOGASTRODUODENOSCOPY (EGD) WITH PROPOFOL Left 06/04/2017   Procedure:  ESOPHAGOGASTRODUODENOSCOPY (EGD) WITH PROPOFOL;  Surgeon: Willis Modena, MD;  Location: [facility] ENDOSCOPY;  Service: Endoscopy;  Laterality: Left;   ESOPHAGOGASTRODUODENOSCOPY (EGD) WITH PROPOFOL N/A 09/03/2017   Procedure: ESOPHAGOGASTRODUODENOSCOPY (EGD) WITH PROPOFOL;  Surgeon: Kathi Der, MD;  [facility]: [facility] ENDOSCOPY;  Service: Gastroenterology;  Laterality: N/A;   ESOPHAGOGASTRODUODENOSCOPY (EGD) WITH PROPOFOL N/A 04/27/2022   Procedure: ESOPHAGOGASTRODUODENOSCOPY (EGD) WITH PROPOFOL;  Surgeon: Regis Bill, MD;  Location: [facility] ENDOSCOPY;  Service: Endoscopy;  Laterality: N/A;   FISTULA SUPERFICIALIZATION Left 04/02/2017   Procedure: FISTULA SUPERFICIALIZATION LEFT ARM;  Surgeon: Nada Libman, MD;  Location: [facility] OR;  Service: Vascular;  Laterality: Left;   FISTULA SUPERFICIALIZATION Right 04/14/2021   Procedure: RIGHT ARM FISTULA SUPERFICIALIZATION;  Surgeon: Victorino Sparrow, MD;  Location: [facility] OR;  Service: Vascular;  Laterality: Right;  PERIPHERAL NERVE BLOCK   FISTULOGRAM Left 04/07/2016   Procedure: FISTULOGRAM;  Surgeon: Maeola Harman, MD;  Location: [facility] OR;  Service: Vascular;  Laterality: Left;   FRACTURE SURGERY     right ankle   GIVENS CAPSULE STUDY N/A 10/29/2017   Procedure: GIVENS CAPSULE STUDY;  Surgeon: Charlott Rakes, MD;  Location: [facility] ENDOSCOPY;  Service: Endoscopy;  Laterality: N/A;   GIVENS CAPSULE STUDY N/A 12/24/2017   Procedure: GIVENS CAPSULE STUDY;  Surgeon: Lemar Lofty., MD;  Location: [facility] ENDOSCOPY;  Service: Gastroenterology;  Laterality: N/A;   GIVENS CAPSULE STUDY N/A 05/22/2018   Procedure: GIVENS CAPSULE STUDY;  Surgeon: Lemar Lofty., MD;  Location: [facility] ENDOSCOPY;  Service: Gastroenterology;  Laterality: N/A;   INSERTION OF DIALYSIS CATHETER Right 04/07/2016   Procedure: INSERTION OF DIALYSIS CATHETER;  Surgeon: Maeola Harman, MD;  Location: [facility] OR;  Service: Vascular;  Laterality: Right;   INSERTION OF DIALYSIS  CATHETER Right 04/04/2017   Procedure: INSERTION OF DIALYSIS CATHETER;  Surgeon: Maeola Harman, MD;  Location: [facility] OR;  Service: Vascular;  Laterality: Right;   IR FLUORO GUIDE CV LINE LEFT  03/13/2020   IR US GUIDE VASC ACCESS LEFT  03/13/2020   LEFT HEART CATH AND CORONARY ANGIOGRAPHY N/A 05/12/2019   Procedure: LEFT HEART CATH AND CORONARY ANGIOGRAPHY;  Surgeon: Orpah Cobb, MD;  [facility]: [facility] INVASIVE CV LAB;  Service: Cardiovascular;  Laterality: N/A;   LIGATION OF ARTERIOVENOUS  FISTULA Left 04/14/2016   Procedure: LIGATION OF ARTERIOVENOUS  FISTULA;  Surgeon: Chuck Hint, MD;  Location: [facility] OR;  Service: Vascular;  Laterality: Left;   LIGATION OF COMPETING BRANCHES OF ARTERIOVENOUS FISTULA Right 05/17/2020   Procedure: LIGATION OF COMPETING BRANCHES OF RIGHT ARM ARTERIOVENOUS FISTULA;  Surgeon: Nada Libman, MD;  Location: [facility] OR;  Service: Vascular;  Laterality: Right;   PATCH ANGIOPLASTY Left 06/19/2016   Procedure: PATCH ANGIOPLASTY LEFT ARTERIOVENOUS FISTULA;  Surgeon: Chuck Hint, MD;  Location: [facility] OR;  Service: Vascular;  Laterality: Left;   POLYPECTOMY  09/04/2017   Procedure: POLYPECTOMY;  Surgeon: Kerin Salen, MD;  Location: [facility] ENDOSCOPY;  Service: Gastroenterology;;   REVISON OF ARTERIOVENOUS FISTULA Left 06/19/2016   Procedure: REVISION SUPERFICIALIZATION OF BRACHIOCEPHALIC ARTERIOVENOUS FISTULA;  Surgeon: Chuck Hint, MD;  Location: [facility] OR;  Service: Vascular;  Laterality: Left;   UPPER EXTREMITY VENOGRAPHY Bilateral 02/19/2021   Procedure: UPPER EXTREMITY VENOGRAPHY;  Surgeon: Victorino Sparrow, MD;  Location: [facility] INVASIVE CV LAB;  Service: Cardiovascular;  Laterality: Bilateral;   Current Outpatient Medications  Medication Instructions   acetaminophen (TYLENOL) 1,000 mg, Oral, Every 6 hours PRN   allopurinol (ZYLOPRIM) 100 mg, Oral, Daily at bedtime   ALPRAZolam (XANAX) 1 mg, Oral, 3 times daily PRN   amLODipine (NORVASC) 10 mg, Oral, Daily    ascorbic acid (VITAMIN C) 500 mg, Oral, 2 times daily   benzonatate (TESSALON) 200 mg, Oral, 3 times daily PRN   carbidopa-levodopa (SINEMET IR) 25-100 MG tablet 1 tablet, Oral, 3 times daily   carvedilol (COREG) 25 mg, Oral, 2 times daily with meals   chlorpheniramine-HYDROcodone (TUSSIONEX) 10-8 MG/5ML 5 mLs, Oral, Every 12 hours PRN   Diclofenac Sodium 1 % CREA 1 gram qid prn   DULoxetine (CYMBALTA) 30 mg, Oral, Daily   ezetimibe (ZETIA) 10 mg, Oral, Daily   gabapentin (NEURONTIN) 100 MG capsule TAKE 3 CAPSULES(300 MG) BY MOUTH THREE TIMES DAILY   hydrALAZINE (APRESOLINE) 75 mg, Oral, Every 8 hours   hydrocortisone (ANUSOL-HC) 2.5 % rectal cream 1 Application, Rectal, 2 times daily   hydrOXYzine (ATARAX) 25 mg, Oral, Every 6 hours PRN   isosorbide mononitrate (IMDUR) 60 mg, Oral, Every evening, Skip the dose if systolic BP less than 140 mmHg   lanthanum (FOSRENOL) 1,000-2,000 mg, Oral, See admin instructions, Take 2 tablets (  2000mg ) by mouth three times daily with meals and take 1 tablet (1000mg ) by mouth twice daily with snacks   lidocaine-prilocaine (EMLA) cream 1gram tid prn   multivitamin (RENA-VIT) TABS tablet 1 tablet, Oral, Daily at bedtime   omeprazole (PRILOSEC) 40 mg, Oral, Daily   ondansetron (ZOFRAN) 4 mg, Oral, 2 times daily PRN   polyethylene glycol (MIRALAX) 17 g, Oral, Daily   promethazine (PHENERGAN) 25 mg, Oral, 3 times daily PRN   rOPINIRole (REQUIP) 1 mg, Oral, Daily at bedtime   vortioxetine HBr (TRINTELLIX) 10 mg, Oral, Daily   zinc sulfate 220 mg, Oral, Daily   zolpidem (AMBIEN) 5 mg, Oral, Daily at bedtime     Family History  Problem Relation Age of Onset   Hypertension Mother    Thyroid disease Mother    Coronary artery disease Father    Hypertension Father    Diabetes Father    Colon cancer Maternal Aunt    Esophageal cancer Maternal Uncle    Inflammatory bowel disease Neg Hx    Liver disease Neg Hx    Pancreatic cancer Neg Hx    Rectal cancer  Neg Hx    Stomach cancer Neg Hx    Social History:  reports that she has never smoked. She has never used smokeless tobacco. She reports that she does not drink alcohol and does not use drugs.  Exam: Current vital signs: BP 128/71   Pulse 82   Temp 98 F (36.7 C)   Resp 18   SpO2 100%  Vital signs in last 24 hours: Temp:  [98 F (36.7 C)] 98 F (36.7 C) (08/19 1014) Pulse Rate:  [82] 82 (08/19 1014) Resp:  [18] 18 (08/19 1014) BP: (128)/(71) 128/71 (08/19 1014) SpO2:  [100 %] 100 % (08/19 1014)   Physical Exam  Constitutional: Appears well-developed and well-nourished.  Psych: Affect calm and cooperative Eyes: No scleral injection, frequent linking and eye rolling movements as previously described HENT: No oropharyngeal obstruction.  MSK: no major joint deformities.  Cardiovascular: Perfusing extremities well Respiratory: Effort normal, non-labored breathing GI: Soft.  No distension. There is no tenderness.  Skin: Warm dry and intact visible skin  Neuro: Mental Status: Patient is awake, alert, oriented to person, month, age, and general situation but inconsistent in details No signs of aphasia or neglect Cranial Nerves: II: Visual Fields are full. Pupils are equal, round, and reactive to light.   III,IV, VI: EOMI without ptosis or diploplia (she reports intermittent diplopia at times but none currently, pursuits are somewhat difficult to test due to her frequent involuntary eye movements).  V: Facial sensation is symmetric to eyelash brush VII: Facial movement is symmetric.  VIII: hearing is intact to voice Motor: Slight drift in all 4 extremities without lateralizing weakness Sensory: Reports some reduced sensation in the left upper extremity but unclear on the chronicity of this Cerebellar: FNF and HKS are intact bilaterally Gait:  Deferred   NIHSS total 6 Score breakdown: 1 point for weakness in each of the 4 limbs, 1 point for left upper extremity sensory  loss, 1 point for mildly stuttering speech   I have reviewed labs in epic and the results pertinent to this consultation are:  Basic Metabolic Panel: Recent Labs  Lab 08/24/22 1240 08/24/22 1245  NA 136 136  K 4.4 4.4  CL 95* 96*  CO2 25 23  GLUCOSE 87 87  BUN 42* 42*  CREATININE 5.52* 5.55*  CALCIUM 8.2* 8.3*    CBC: Recent  Labs  Lab 08/24/22 1020  WBC 7.1  7.4  NEUTROABS 4.8  HGB 8.5*  8.4*  HCT 28.4*  27.1*  MCV 82.6  79.9*  PLT 330  309    Coagulation Studies: No results for input(s): "LABPROT", "INR" in the last 72 hours.    I have reviewed the images obtained: Head CT personally reviewed, agree with radiology no acute intracranial process MRI brain personally reviewed, agree with radiology no acute intracranial process  Impression: Overall favor a toxic/metabolic etiology to her generalized weakness and slurred speech.  When there was lack of clarity on her last known well CTA was considered, however after clarification that her symptoms had been ongoing for almost 24 hours and exam inconsistent with LVO, decision was made to pursue MRI brain to exclude any small strokes contributing to her symptoms given her significant stroke risk factors  Recommendations: -MRI brain without contrast; -Toxic/metabolic workup/management per ED/Primary -Recommend outpatient sleep study for concern for obstructive sleep apnea based on breathing pattern after Ativan -Inpatient neurology will be available as needed  Brooke Dare MD-PhD [facility] [phone] Available 7 AM to 7 PM, outside these hours please contact Neurologist on call listed on AMION   Total critical care time: 35 minutes   Critical care time was exclusive of separately billable procedures and treating other patients.   Critical care was necessary to treat or prevent imminent or life-threatening deterioration; emergent evaluation for consideration of thrombectomy or thrombolytic.    Critical care was time spent personally by me on the following activities: development of treatment plan with patient and/or surrogate as well as nursing, discussions with consultants/primary team, evaluation of patient's response to treatment, examination of patient, obtaining history from patient or surrogate, ordering and performing treatments and interventions, ordering and review of laboratory studies, ordering and review of radiographic studies, and re-evaluation of patient's condition as needed, as documented above.

## 2022-08-24 NOTE — ED Notes (Addendum)
This Rn went to get pt for IV team to deaccess her dialysis cath. Pt very sleep but woke up to this RN and said she was just tired. Pt taken to the back. IV team RN found and states pt needs to be in bed or recliner just in case. This RN searched for bed and couln't find one but was able to get a recliner. IV team nurse stated that would be fine. Pt still sleeping. Pt awaken to move to recliner. Pt assisted with this RN and  was able to stand to sat down in recliner in subwait area. This RN left to get tape for IV team nurse and returned and pt mumbling words and still very sleepy. This RN called Charge to inform of mental status change in pt and need for room. Pt taken to ED 18h and Pt was hooked to monitor and pulse ox.  Pt did have some labored breathing noted. BP and O2 WNL.

## 2022-08-24 NOTE — Assessment & Plan Note (Signed)
Cont zetia.  

## 2022-08-24 NOTE — H&P (Addendum)
History and Physical    Patient: Patricia [name] DOB: 1975-02-19 DOA: 08/24/2022 DOS: the patient was seen and examined on 08/24/2022 PCP: Rick Duff, PA-C  Patient coming from:  Dialysis Center   Chief Complaint:  Chief Complaint  Patient presents with   Chest Pain   HPI: Patricia [name] is a 47 y.o. female with medical history significant of ESRD hemodialysis Monday Wednesday Friday, anemia of chronic disease, hypertension, type 2 diabetes, peripheral neuropathy, HFpEF presenting with encephalopathy, chest pain.  Limited history in setting of encephalopathy.  Per report, patient episode of chest pain approximately 2 hours into dialysis.  Was sent to the ER for further evaluation.  Upon arrival patient with noted lethargy at the bedside.  Was formally evaluated by neurology with concern for stroke.  CT head as well as MRI grossly within normal limits.  Patient currently sleeping but denies any active chest pain or shortness of breath on questioning.  No reported abdominal pain nausea vomiting. Presented to the ER afebrile, hemodynamically stable.  Satting well on room air.  White count 7.4, hemoglobin 8.4, platelets 309, creatinine 5.55.  Chest x-ray, CT head, MRI grossly within normal limits. Neurology formally consulted.   Review of Systems: As mentioned in the history of present illness. All other systems reviewed and are negative. Past Medical History:  Diagnosis Date   Acid reflux    Anemia 04/2019   REQUIRING BLOOD TRANSFUSIONS   Anxiety    Arthritis    CHF (congestive heart failure) (HCC)    Dyspnea    with exertion   Dysrhythmia    tachycardia   ESRD (end stage renal disease) (HCC)    [facility] [place]   Facial twitching 11/2020   mouth twitch, eyes roll back, neurology started patient on low dose   Sinemet IR.   GI bleeding 12/2017   Gout    Headache(784.0)    "related to chemo; sometimes weekly" (09/12/2013)   Heart murmur     Systolic per Dr Orpah Cobb' notes   High cholesterol    History of blood transfusion "a couple"   "related to low counts"   Hypertension    Iron deficiency anemia    "get epogen shots q month" (02/20/2014)   MCGN (minimal change glomerulonephritis)    "using chemo to tx;  finished my last tx in 12/2013"   Neuropathy    Peritoneal dialysis status (HCC)    Stroke (HCC) 08/15/2017   ruled out that it was not a stroke   Past Surgical History:  Procedure Laterality Date   ABDOMINAL HYSTERECTOMY  2010   "laparoscopic"   ANKLE FRACTURE SURGERY Right 1994   AV FISTULA PLACEMENT Left 04/14/2016   Procedure: ARTERIOVENOUS (AV) FISTULA CREATION;  Surgeon: Chuck Hint, MD;  Location: [facility] OR;  Service: Vascular;  Laterality: Left;   AV FISTULA PLACEMENT Left 08/09/2017   Procedure: INSERTION OF ARTERIOVENOUS (AV) GORE-TEX GRAFT LEFT ARM;  Surgeon: Sherren Kerns, MD;  Location: [facility] OR;  Service: Vascular;  Laterality: Left;   AV FISTULA PLACEMENT Right 03/15/2020   Procedure: RIGHT ARM ARTERIOVENOUS (AV) FISTULA CREATION 1st Stage Basilic Transposition;  Surgeon: Nada Libman, MD;  Location: [facility] OR;  Service: Vascular;  Laterality: Right;   AV FISTULA PLACEMENT Right 02/24/2021   Procedure: RIGHT ARM ARTERIOVENOUS  FISTULA CREATION;  Surgeon: Victorino Sparrow, MD;  Location: [facility] OR;  Service: Vascular;  Laterality: Right;  regional block   AVGG REMOVAL Left 08/11/2017   Procedure:  REMOVAL OF ARTERIOVENOUS GORETEX GRAFT (AVGG);  Surgeon: Nada Libman, MD;  Location: [facility] OR;  Service: Vascular;  Laterality: Left;   BASCILIC VEIN TRANSPOSITION Right 05/17/2020   Procedure: RIGHT UPPER EXTREMITY SECOND STAGE BASCILIC VEIN TRANSPOSITION;  Surgeon: Nada Libman, MD;  Location: [facility] OR;  Service: Vascular;  Laterality: Right;   BIOPSY  09/03/2017   Procedure: BIOPSY;  Surgeon: Kathi Der, MD;  Location: [facility] ENDOSCOPY;  Service: Gastroenterology;;   BIOPSY  12/24/2017   Procedure:  BIOPSY;  Surgeon: Lemar Lofty., MD;  Location: [facility] ENDOSCOPY;  Service: Gastroenterology;;   CARDIAC CATHETERIZATION  2000's   COLONOSCOPY WITH PROPOFOL N/A 09/04/2017   Procedure: COLONOSCOPY WITH PROPOFOL;  Surgeon: Kerin Salen, MD;  Location: [facility] ENDOSCOPY;  Service: Gastroenterology;  Laterality: N/A;   ENTEROSCOPY N/A 12/24/2017   Procedure: ENTEROSCOPY;  Surgeon: Meridee Score Netty Starring., MD;  Location: [facility] ENDOSCOPY;  Service: Gastroenterology;  Laterality: N/A;   ESOPHAGOGASTRODUODENOSCOPY     ESOPHAGOGASTRODUODENOSCOPY (EGD) WITH PROPOFOL Left 06/04/2017   Procedure: ESOPHAGOGASTRODUODENOSCOPY (EGD) WITH PROPOFOL;  Surgeon: Willis Modena, MD;  Location: [facility] ENDOSCOPY;  Service: Endoscopy;  Laterality: Left;   ESOPHAGOGASTRODUODENOSCOPY (EGD) WITH PROPOFOL N/A 09/03/2017   Procedure: ESOPHAGOGASTRODUODENOSCOPY (EGD) WITH PROPOFOL;  Surgeon: Kathi Der, MD;  Location: [facility] ENDOSCOPY;  Service: Gastroenterology;  Laterality: N/A;   ESOPHAGOGASTRODUODENOSCOPY (EGD) WITH PROPOFOL N/A 04/27/2022   Procedure: ESOPHAGOGASTRODUODENOSCOPY (EGD) WITH PROPOFOL;  Surgeon: Regis Bill, MD;  Location: [facility] ENDOSCOPY;  Service: Endoscopy;  Laterality: N/A;   FISTULA SUPERFICIALIZATION Left 04/02/2017   Procedure: FISTULA SUPERFICIALIZATION LEFT ARM;  Surgeon: Nada Libman, MD;  Location[facility] OR;  Service: Vascular;  Laterality: Left;   FISTULA SUPERFICIALIZATION Right 04/14/2021   Procedure: RIGHT ARM FISTULA SUPERFICIALIZATION;  Surgeon: Victorino Sparrow, MD;  Location: [facility] OR;  Service: Vascular;  Laterality: Right;  PERIPHERAL NERVE BLOCK   FISTULOGRAM Left 04/07/2016   Procedure: FISTULOGRAM;  Surgeon: Maeola Harman, MD;  Location: [facility] OR;  Service: Vascular;  Laterality: Left;   FRACTURE SURGERY     right ankle   GIVENS CAPSULE STUDY N/A 10/29/2017   Procedure: GIVENS CAPSULE STUDY;  Surgeon: Charlott Rakes, MD;  Location: [facility] ENDOSCOPY;  Service: Endoscopy;   Laterality: N/A;   GIVENS CAPSULE STUDY N/A 12/24/2017   Procedure: GIVENS CAPSULE STUDY;  Surgeon: Lemar Lofty., MD;  Location: [facility] ENDOSCOPY;  Service: Gastroenterology;  Laterality: N/A;   GIVENS CAPSULE STUDY N/A 05/22/2018   Procedure: GIVENS CAPSULE STUDY;  Surgeon: Lemar Lofty., MD;  Location: [facility] ENDOSCOPY;  Service: Gastroenterology;  Laterality: N/A;   INSERTION OF DIALYSIS CATHETER Right 04/07/2016   Procedure: INSERTION OF DIALYSIS CATHETER;  Surgeon: Maeola Harman, MD;  Location: [facility] OR;  Service: Vascular;  Laterality: Right;   INSERTION OF DIALYSIS CATHETER Right 04/04/2017   Procedure: INSERTION OF DIALYSIS CATHETER;  Surgeon: Maeola Harman, MD;  Location: [facility] OR;  Service: Vascular;  Laterality: Right;   IR FLUORO GUIDE CV LINE LEFT  03/13/2020   IR US GUIDE VASC ACCESS LEFT  03/13/2020   LEFT HEART CATH AND CORONARY ANGIOGRAPHY N/A 05/12/2019   Procedure: LEFT HEART CATH AND CORONARY ANGIOGRAPHY;  Surgeon: Orpah Cobb, MD;  Location: [facility] INVASIVE CV LAB;  Service: Cardiovascular;  Laterality: N/A;   LIGATION OF ARTERIOVENOUS  FISTULA Left 04/14/2016   Procedure: LIGATION OF ARTERIOVENOUS  FISTULA;  Surgeon: Chuck Hint, MD;  Location: [facility] OR;  Service: Vascular;  Laterality: Left;   LIGATION OF COMPETING BRANCHES OF ARTERIOVENOUS FISTULA Right  05/17/2020   Procedure: LIGATION OF COMPETING BRANCHES OF RIGHT ARM ARTERIOVENOUS FISTULA;  Surgeon: Nada Libman, MD;  Location: [facility] OR;  Service: Vascular;  Laterality: Right;   PATCH ANGIOPLASTY Left 06/19/2016   Procedure: PATCH ANGIOPLASTY LEFT ARTERIOVENOUS FISTULA;  Surgeon: Chuck Hint, MD;  Location: [facility] OR;  Service: Vascular;  Laterality: Left;   POLYPECTOMY  09/04/2017   Procedure: POLYPECTOMY;  Surgeon: Kerin Salen, MD;  Location: [facility] ENDOSCOPY;  Service: Gastroenterology;;   REVISON OF ARTERIOVENOUS FISTULA Left 06/19/2016   Procedure: REVISION SUPERFICIALIZATION OF  BRACHIOCEPHALIC ARTERIOVENOUS FISTULA;  Surgeon: Chuck Hint, MD;  Location: [facility] OR;  Service: Vascular;  Laterality: Left;   UPPER EXTREMITY VENOGRAPHY Bilateral 02/19/2021   Procedure: UPPER EXTREMITY VENOGRAPHY;  Surgeon: Victorino Sparrow, MD;  Location: [facility] INVASIVE CV LAB;  Service: Cardiovascular;  Laterality: Bilateral;   Social History:  reports that she has never smoked. She has never used smokeless tobacco. She reports that she does not drink alcohol and does not use drugs.  Allergies  Allergen Reactions   Nsaids Other (See Comments)    Cannot take due to Kidney disease/Kidney function  Other Reaction(s): Unknown   Tolmetin Other (See Comments)    Cannot take due to Kidney Disease    Family History  Problem Relation Age of Onset   Hypertension Mother    Thyroid disease Mother    Coronary artery disease Father    Hypertension Father    Diabetes Father    Colon cancer Maternal Aunt    Esophageal cancer Maternal Uncle    Inflammatory bowel disease Neg Hx    Liver disease Neg Hx    Pancreatic cancer Neg Hx    Rectal cancer Neg Hx    Stomach cancer Neg Hx     Prior to Admission medications   Medication Sig Start Date End Date Taking? Authorizing Provider  acetaminophen (TYLENOL) 500 MG tablet Take 1,000 mg by mouth every 6 (six) hours as needed for moderate pain.   Yes [provider]  allopurinol (ZYLOPRIM) 100 MG tablet Take 100 mg by mouth at bedtime.    Yes [provider]  ALPRAZolam Prudy Feeler) 1 MG tablet Take 1 mg by mouth 3 (three) times daily as needed for anxiety or sleep.   Yes [provider]  amLODipine (NORVASC) 10 MG tablet Take 1 tablet (10 mg total) by mouth daily. 07/25/22  Yes Wouk, Wilfred Curtis, MD  ascorbic acid (VITAMIN C) 500 MG tablet Take 1 tablet (500 mg total) by mouth 2 (two) times daily. 08/13/22  Yes Arnetha Courser, MD  benzonatate (TESSALON) 200 MG capsule Take 1 capsule (200 mg total) by mouth 3 (three) times  daily as needed for cough. 08/13/22  Yes Arnetha Courser, MD  carbidopa-levodopa (SINEMET IR) 25-100 MG tablet TAKE 1 TABLET BY MOUTH THREE TIMES DAILY 06/02/22  Yes Levert Feinstein, MD  carvedilol (COREG) 25 MG tablet Take 1 tablet (25 mg total) by mouth 2 (two) times daily with a meal. 07/24/22  Yes Wouk, Wilfred Curtis, MD  chlorpheniramine-HYDROcodone (TUSSIONEX) 10-8 MG/5ML Take 5 mLs by mouth every 12 (twelve) hours as needed for cough. 08/13/22  Yes Arnetha Courser, MD  Diclofenac Sodium 1 % CREA 1 gram qid prn 05/26/21  Yes Levert Feinstein, MD  DULoxetine (CYMBALTA) 30 MG capsule Take 1 capsule (30 mg total) by mouth daily. 05/06/22  Yes Levert Feinstein, MD  ezetimibe (ZETIA) 10 MG tablet Take 10 mg by mouth daily.   Yes [provider]  gabapentin (NEURONTIN) 100 MG capsule TAKE 3 CAPSULES(300 MG) BY MOUTH THREE TIMES DAILY 06/25/22  Yes Levert Feinstein, MD  hydrALAZINE (APRESOLINE) 25 MG tablet Take 3 tablets (75 mg total) by mouth every 8 (eight) hours. 08/13/22  Yes Arnetha Courser, MD  hydrOXYzine (ATARAX/VISTARIL) 25 MG tablet Take 25 mg by mouth every 6 (six) hours as needed for itching.   Yes [provider]  isosorbide mononitrate (IMDUR) 60 MG 24 hr tablet Take 1 tablet (60 mg total) by mouth every evening. Skip the dose if systolic BP less than 140 mmHg 05/13/22 05/13/23 Yes Gillis Santa, MD  lanthanum (FOSRENOL) 1000 MG chewable tablet Chew 1,000-2,000 mg by mouth See admin instructions. Take 2 tablets (2000mg ) by mouth three times daily with meals and take 1 tablet (1000mg ) by mouth twice daily with snacks   Yes [provider]  lidocaine-prilocaine (EMLA) cream 1gram tid prn 05/26/21  Yes Levert Feinstein, MD  multivitamin (RENA-VIT) TABS tablet Take 1 tablet by mouth at bedtime.    Yes [provider]  omeprazole (PRILOSEC) 40 MG capsule Take 1 capsule (40 mg total) by mouth daily. 08/13/22  Yes Arnetha Courser, MD  ondansetron (ZOFRAN) 4 MG tablet Take 4 mg by mouth 2 (two) times daily as needed  for nausea or vomiting.   Yes [provider]  polyethylene glycol (MIRALAX) 17 g packet Take 17 g by mouth daily. Patient taking differently: Take 17 g by mouth daily as needed for mild constipation or moderate constipation. 07/01/22  Yes Pilar Jarvis, MD  pregabalin (LYRICA) 75 MG capsule Take 75 mg by mouth at bedtime. 08/13/22  Yes [provider]  promethazine (PHENERGAN) 25 MG tablet Take 25 mg by mouth 3 (three) times daily as needed for nausea or vomiting.   Yes [provider]  rOPINIRole (REQUIP) 1 MG tablet Take 1 tablet (1 mg total) by mouth at bedtime. 04/29/22  Yes Levert Feinstein, MD  vortioxetine HBr (TRINTELLIX) 10 MG TABS tablet Take 10 mg by mouth daily.   Yes [provider]  zinc sulfate 220 (50 Zn) MG capsule Take 1 capsule (220 mg total) by mouth daily. 08/13/22  Yes Arnetha Courser, MD  zolpidem (AMBIEN) 5 MG tablet Take 5 mg by mouth at bedtime. 04/07/21  Yes [provider]  hydrocortisone (ANUSOL-HC) 2.5 % rectal cream Place 1 Application rectally 2 (two) times daily. Patient not taking: Reported on 08/09/2022 07/01/22   Pilar Jarvis, MD    Physical Exam: Vitals:   08/24/22 1600 08/24/22 1650 08/24/22 1650 08/24/22 1700  BP: (!) 155/80  132/77   Pulse: 89  86   Resp: (!) 27 10 16    Temp:      SpO2: 100%  100%   Weight:    110 kg  Height:    5\' 7"  (1.702 m)   Physical Exam Constitutional:      Comments: Morbidly obese  Snoring    HENT:     Head: Normocephalic and atraumatic.     Nose: Nose normal.     Mouth/Throat:     Mouth: Mucous membranes are moist.  Cardiovascular:     Rate and Rhythm: Normal rate and regular rhythm.  Pulmonary:     Effort: Pulmonary effort is normal.  Abdominal:     Comments: Obese abdomen  Nontender   Skin:    General: Skin is warm.  Neurological:     General: No focal deficit present.  Psychiatric:        Mood  and Affect: Mood normal.     Data Reviewed:  There are no new results to review  at this time. MR BRAIN WO CONTRAST CLINICAL DATA:  Neuro deficit, acute, stroke suspected  EXAM: MRI HEAD WITHOUT CONTRAST  TECHNIQUE: Multiplanar, multiecho pulse sequences of the brain and surrounding structures were obtained without intravenous contrast.  COMPARISON:  CT head from today  FINDINGS: Motion limited study. Brain: No acute infarction, hemorrhage, hydrocephalus, extra-axial collection or mass lesion.  Vascular: Major arterial flow voids are maintained at the skull base.  Skull and upper cervical spine: Normal marrow signal.  Sinuses/Orbits: Clear sinuses.  No acute orbital findings.  Other: No mastoid effusions.  IMPRESSION: Motion limited study without evidence of acute intracranial abnormality.  Electronically Signed   By: Feliberto Harts M.D.   On: 08/24/2022 14:07 DG Chest 2 View CLINICAL DATA:  47 year old female with chest pain  EXAM: CHEST - 2 VIEW  COMPARISON:  08/11/2022, 08/09/2022, 07/28/2022, chest CT 07/23/2022  FINDINGS: Cardiomediastinal silhouette unchanged.  No pneumothorax.  No pleural effusion.  No interlobular septal thickening.  Low lung volumes.  No new airspace disease. Improved aeration compared to the plain film of 08/11/2022 and 08/09/2022  Stent of the right upper extremity.  IMPRESSION: Low lung volumes, without evidence of acute cardiopulmonary disease  Electronically Signed   By: Gilmer Mor D.O.   On: 08/24/2022 13:13 CT HEAD CODE STROKE WO CONTRAST` CLINICAL DATA:  Code stroke.  Mental status change, unknown cause  EXAM: CT HEAD WITHOUT CONTRAST  TECHNIQUE: Contiguous axial images were obtained from the base of the skull through the vertex without intravenous contrast.  RADIATION DOSE REDUCTION: This exam was performed according to the departmental dose-optimization program which includes automated exposure control, adjustment of the mA and/or kV according to patient size and/or use of  iterative reconstruction technique.  COMPARISON:  CT head February 7, 24.  FINDINGS: Brain: No evidence of acute infarction, hemorrhage, hydrocephalus, extra-axial collection or mass lesion/mass effect.  Vascular: No hyperdense vessel.  Skull: No acute fracture.  Sinuses/Orbits: Clear sinuses.  No acute orbital findings  Other: No mastoid effusions.  ASPECTS [facility] Stroke Program Early CT Score)  - Ganglionic level infarction (caudate, lentiform nuclei, internal capsule, insula, M1-M3 cortex): 10.  IMPRESSION: 1. No evidence of acute intracranial abnormality. 2. ASPECTS is 10.  Code stroke imaging results were communicated on 08/24/2022 at 12:58 pm to provider Bhagat via secure text paging.  Electronically Signed   By: Feliberto Harts M.D.   On: 08/24/2022 12:59  Lab Results  Component Value Date   WBC 7.4 08/24/2022   WBC 7.1 08/24/2022   HGB 8.4 (L) 08/24/2022   HGB 8.5 (L) 08/24/2022   HCT 27.1 (L) 08/24/2022   HCT 28.4 (L) 08/24/2022   MCV 79.9 (L) 08/24/2022   MCV 82.6 08/24/2022   PLT 309 08/24/2022   PLT 330 08/24/2022   Last metabolic panel Lab Results  Component Value Date   GLUCOSE 87 08/24/2022   NA 136 08/24/2022   K 4.4 08/24/2022   CL 96 (L) 08/24/2022   CO2 23 08/24/2022   BUN 42 (H) 08/24/2022   CREATININE 5.55 (H) 08/24/2022   GFRNONAA 9 (L) 08/24/2022   CALCIUM 8.3 (L) 08/24/2022   PHOS 5.8 (H) 08/12/2022   PROT 7.1 08/24/2022   ALBUMIN 3.3 (L) 08/24/2022   LABGLOB 2.9 01/13/2021   AGRATIO 1.6 01/13/2021   BILITOT 0.4 08/24/2022   ALKPHOS 72 08/24/2022   AST 26 08/24/2022  ALT 11 08/24/2022   ANIONGAP 17 (H) 08/24/2022    Assessment and Plan: * Acute encephalopathy Noted progressive lethargy after dialysis today Suspect toxic metabolic encephalopathy with component of polypharmacy and OHS Patient is snoring at the bedside Fairly, to have significant lethargy after extended dialysis sessions CT head stable MRI of the  brain also within normal limits Status post formal neuro evaluation Nonfocal neuroexam Will monitor for now Hold sedating medications Check blood gas to rule out hypercarbia  Consider outpatient sleep study versus trial of CPAP  Chest pain Positive transient chest pain during hemodialysis No reported active chest pain at present Troponin negative x 2 EKG normal sinus rhythm Baby aspirin Monitor    ESRD (end stage renal disease) (HCC) Baseline ESRD on hemodialysis Monday Wednesday Friday Status post hemodialysis today Nephrology consult as clinically indicated  Hypertension BP stable  Titrate home regimen    Type 2 diabetes mellitus with other diabetic kidney complication (HCC) SSI    (HFpEF) heart failure with preserved ejection fraction (HCC) 2D ECHO 07/2022 w/ EF 60-65%  Fairly euvolemic s/p partial HD  Monitor volume status closely   Chronic gouty arthritis Allopurinol   Anemia Baseline anemia of chronic disease Hemoglobin 8.5 today with baseline hemoglobin between 7 and 9 Monitor  GERD (gastroesophageal reflux disease) PPI   Dyslipidemia Cont zetia    Greater than 50% was spent in counseling and coordination of care with patient Total encounter time 80 minutes or more    Advance Care Planning:   Code Status: Full Code   Consults: Nephrology as clinically indicated   Family Communication: No family at the bedside   Severity of Illness: The appropriate patient status for this patient is OBSERVATION. Observation status is judged to be reasonable and necessary in order to provide the required intensity of service to ensure the patient's safety. The patient's presenting symptoms, physical exam findings, and initial radiographic and laboratory data in the context of their medical condition is felt to place them at decreased risk for further clinical deterioration. Furthermore, it is anticipated that the patient will be medically stable for discharge from the  hospital within 2 midnights of admission.   Author: Floydene Flock, MD 08/24/2022 5:07 PM  For on call review [email or web address].

## 2022-08-24 NOTE — Progress Notes (Signed)
CODE STROKE- PHARMACY COMMUNICATION   Time CODE STROKE called/page received: 1251  Time response to CODE STROKE was made (in person or via phone): in-person  Time Stroke Kit retrieved from [employer] (only if needed): N/A - TNK not given d/t patient's symptoms starting outside time window  Name of Provider/Nurse contacted: Dr. Iver Nestle  Past Medical History:  Diagnosis Date   Acid reflux    Anemia 04/2019   REQUIRING BLOOD TRANSFUSIONS   Anxiety    Arthritis    CHF (congestive heart failure) (HCC)    Dyspnea    with exertion   Dysrhythmia    tachycardia   ESRD (end stage renal disease) (HCC)    [facility]   Facial twitching 11/2020   mouth twitch, eyes roll back, neurology started patient on low dose   Sinemet IR.   GI bleeding 12/2017   Gout    Headache(784.0)    "related to chemo; sometimes weekly" (09/12/2013)   Heart murmur    Systolic per Dr Orpah Cobb' notes   High cholesterol    History of blood transfusion "a couple"   "related to low counts"   Hypertension    Iron deficiency anemia    "get epogen shots q month" (02/20/2014)   MCGN (minimal change glomerulonephritis)    "using chemo to tx;  finished my last tx in 12/2013"   Neuropathy    Peritoneal dialysis status (HCC)    Stroke (HCC) 08/15/2017   ruled out that it was not a stroke   Prior to Admission medications   Medication Sig Start Date End Date Taking? Authorizing Provider  acetaminophen (TYLENOL) 500 MG tablet Take 1,000 mg by mouth every 6 (six) hours as needed for moderate pain.   Yes [provider]  allopurinol (ZYLOPRIM) 100 MG tablet Take 100 mg by mouth at bedtime.    Yes [provider]  ALPRAZolam Prudy Feeler) 1 MG tablet Take 1 mg by mouth 3 (three) times daily as needed for anxiety or sleep.   Yes [provider]  amLODipine (NORVASC) 10 MG tablet Take 1 tablet (10 mg total) by mouth daily. 07/25/22  Yes Wouk, Wilfred Curtis, MD  ascorbic acid (VITAMIN C) 500 MG tablet  Take 1 tablet (500 mg total) by mouth 2 (two) times daily. 08/13/22  Yes Arnetha Courser, MD  benzonatate (TESSALON) 200 MG capsule Take 1 capsule (200 mg total) by mouth 3 (three) times daily as needed for cough. 08/13/22  Yes Arnetha Courser, MD  carbidopa-levodopa (SINEMET IR) 25-100 MG tablet TAKE 1 TABLET BY MOUTH THREE TIMES DAILY 06/02/22  Yes Levert Feinstein, MD  carvedilol (COREG) 25 MG tablet Take 1 tablet (25 mg total) by mouth 2 (two) times daily with a meal. 07/24/22  Yes Wouk, Wilfred Curtis, MD  chlorpheniramine-HYDROcodone (TUSSIONEX) 10-8 MG/5ML Take 5 mLs by mouth every 12 (twelve) hours as needed for cough. 08/13/22  Yes Arnetha Courser, MD  Diclofenac Sodium 1 % CREA 1 gram qid prn 05/26/21  Yes Levert Feinstein, MD  DULoxetine (CYMBALTA) 30 MG capsule Take 1 capsule (30 mg total) by mouth daily. 05/06/22  Yes Levert Feinstein, MD  ezetimibe (ZETIA) 10 MG tablet Take 10 mg by mouth daily.   Yes [provider]  gabapentin (NEURONTIN) 100 MG capsule TAKE 3 CAPSULES(300 MG) BY MOUTH THREE TIMES DAILY 06/25/22  Yes Levert Feinstein, MD  hydrALAZINE (APRESOLINE) 25 MG tablet Take 3 tablets (75 mg total) by mouth every 8 (eight) hours. 08/13/22  Yes Arnetha Courser, MD  hydrOXYzine (ATARAX/VISTARIL) 25 MG tablet Take 25 mg by mouth every 6 (six) hours as needed for itching.   Yes [provider]  isosorbide mononitrate (IMDUR) 60 MG 24 hr tablet Take 1 tablet (60 mg total) by mouth every evening. Skip the dose if systolic BP less than 140 mmHg 05/13/22 05/13/23 Yes Gillis Santa, MD  lanthanum (FOSRENOL) 1000 MG chewable tablet Chew 1,000-2,000 mg by mouth See admin instructions. Take 2 tablets (2000mg ) by mouth three times daily with meals and take 1 tablet (1000mg ) by mouth twice daily with snacks   Yes [provider]  lidocaine-prilocaine (EMLA) cream 1gram tid prn 05/26/21  Yes Levert Feinstein, MD  multivitamin (RENA-VIT) TABS tablet Take 1 tablet by mouth at bedtime.    Yes [provider]  omeprazole  (PRILOSEC) 40 MG capsule Take 1 capsule (40 mg total) by mouth daily. 08/13/22  Yes Arnetha Courser, MD  ondansetron (ZOFRAN) 4 MG tablet Take 4 mg by mouth 2 (two) times daily as needed for nausea or vomiting.   Yes [provider]  polyethylene glycol (MIRALAX) 17 g packet Take 17 g by mouth daily. Patient taking differently: Take 17 g by mouth daily as needed for mild constipation or moderate constipation. 07/01/22  Yes Pilar Jarvis, MD  pregabalin (LYRICA) 75 MG capsule Take 75 mg by mouth at bedtime. 08/13/22  Yes [provider]  promethazine (PHENERGAN) 25 MG tablet Take 25 mg by mouth 3 (three) times daily as needed for nausea or vomiting.   Yes [provider]  rOPINIRole (REQUIP) 1 MG tablet Take 1 tablet (1 mg total) by mouth at bedtime. 04/29/22  Yes Levert Feinstein, MD  vortioxetine HBr (TRINTELLIX) 10 MG TABS tablet Take 10 mg by mouth daily.   Yes [provider]  zinc sulfate 220 (50 Zn) MG capsule Take 1 capsule (220 mg total) by mouth daily. 08/13/22  Yes Arnetha Courser, MD  zolpidem (AMBIEN) 5 MG tablet Take 5 mg by mouth at bedtime. 04/07/21  Yes [provider]  hydrocortisone (ANUSOL-HC) 2.5 % rectal cream Place 1 Application rectally 2 (two) times daily. Patient not taking: Reported on 08/09/2022 07/01/22   Pilar Jarvis, MD    Rockwell Alexandria ,PharmD Clinical Pharmacist  08/24/2022  3:06 PM

## 2022-08-24 NOTE — ED Triage Notes (Addendum)
Pt comes via EMS from dialysis with c/o cp. Pt was only 2 hours into treatment before this started. Pt does still have her dialysis needles in  Pt states cp and sob. Pt states some vomiting and HTN in 200s.  BP-134/81  324 aspirin 22 g left hand.  Pt states dizziness and lightheadedness that started yesterday. Pt states left arm pain as well.

## 2022-08-24 NOTE — Code Documentation (Signed)
Stroke Response Nurse Documentation Code Documentation  Patricia [name] is a 47 y.o. female arriving to [facility] via  EMS  on 08/24/2022 with past medical hx of HTN, HLD, GI bleeding , heart murmur, CHF, ESRD. On No antithrombotic. Code stroke was activated by ED.   Patient from dialysis where she was LKW and now complaining of AMS and slurred speech. Patient arrived from dialysis today complaining of chest pain. IV team arrived to de-access patient's dialysis cath and was found with slurred speech and AMS, at which time a code stroke was called. Initial LKW thought to be 1207. Patient reports that she was walking yesterday around 1400-1500 and explains that she felt like she kept veering to the right. Endorsed left sided weakness yesterday and today that she says has improved slightly but not fully resolved. Endorsed intermittent speech difficulties, but unable to confirm time onset.   Stroke team at the bedside on patient arrival. Labs drawn and patient cleared for CT by Dr. Roxan Hockey. Patient to CT with team. NIHSS 7, see documentation for details and code stroke times. Patient with decreased LOC, bilateral arm weakness, bilateral leg weakness, left decreased sensation, and dysarthria  on exam. The following imaging was completed:  CT Head and MRI. Patient is not a candidate for IV Thrombolytic due to outside window, per MD. Patient is not a candidate for IR due to exam not consistent with LVO, per MD.   Care Plan: 1520 discussed with MD Bhagat, confirmed cancel code stroke. No follow up NIHSS/stroke orders needed. Notified EDP Roxan Hockey and RN Abygail.   Bedside handoff with ED RN Abygail.    Wille Glaser  Stroke Response RN

## 2022-08-24 NOTE — Assessment & Plan Note (Addendum)
Noted progressive lethargy after dialysis today Suspect toxic metabolic encephalopathy with component of polypharmacy and OHS Patient is snoring at the bedside Fairly, to have significant lethargy after extended dialysis sessions CT head stable MRI of the brain also within normal limits Status post formal neuro evaluation Nonfocal neuroexam Will monitor for now Hold sedating medications Check blood gas to rule out hypercarbia  Consider outpatient sleep study versus trial of CPAP

## 2022-08-24 NOTE — Assessment & Plan Note (Signed)
Positive transient chest pain during hemodialysis No reported active chest pain at present Troponin negative x 2 EKG normal sinus rhythm Baby aspirin Monitor

## 2022-08-24 NOTE — Assessment & Plan Note (Signed)
BP stable Titrate home regimen 

## 2022-08-24 NOTE — Assessment & Plan Note (Signed)
Baseline ESRD on hemodialysis Monday Wednesday Friday Status post hemodialysis today Nephrology consult as clinically indicated

## 2022-08-24 NOTE — ED Notes (Signed)
Code  stroke  called  to  [facility] 

## 2022-08-24 NOTE — ED Notes (Signed)
Pt reports she can not make urine.  Dialysis pt.

## 2022-08-24 NOTE — Assessment & Plan Note (Signed)
SSI

## 2022-08-24 NOTE — Assessment & Plan Note (Signed)
Baseline anemia of chronic disease Hemoglobin 8.5 today with baseline hemoglobin between 7 and 9 Monitor

## 2022-08-24 NOTE — ED Notes (Incomplete)
12*/07

## 2022-08-24 NOTE — Assessment & Plan Note (Signed)
Allopurinol  

## 2022-08-24 NOTE — ED Notes (Signed)
Resumed care from lauren.  Pt sleeping  nsr on monitor.  Siderails up x 2.  Oxygen in place at 2 liters [place].

## 2022-08-24 NOTE — ED Notes (Signed)
Messaged provider regarding patient's concern for blood clot in left leg.

## 2022-08-24 NOTE — Progress Notes (Signed)
This Chap responded to [employer]. Pt in CT Scan and no family at the moment. Kindly page the [place] as need arise.   08/24/22 1400  Spiritual Encounters  Type of Visit Initial;Attempt (pt unavailable)  Care provided to: Pt not available  Conversation partners present during encounter Nurse  Referral source Code page  Reason for visit Code  OnCall Visit Yes

## 2022-08-24 NOTE — ED Notes (Signed)
MD at bedside. 

## 2022-08-24 NOTE — ED Notes (Signed)
Called lab for blood draw on patient.   Pt called dietary to place dinner order.

## 2022-08-24 NOTE — ED Provider Notes (Signed)
[facility] Provider Note    Event Date/Time   First MD Initiated Contact with Patient 08/24/22 1236     (approximate)   History   Chest Pain   HPI  Patricia [name] is a 47 y.o. female ESRD on intermittent hemodialysis presents to the ER reporting for an episode of chest pain that occurred while at dialysis.  Upon being brought back to ER room patient found to be ER room patient found to be quite drowsy with slurred speech quickly falling asleep.  She is on quite a few medications that can cause drowsiness.  She denies any chest pain or shortness of breath.  Last seen normal was around 1207 in the waiting room.     Physical Exam   Triage Vital Signs: ED Triage Vitals  Encounter Vitals Group     BP 08/24/22 1014 128/71     Systolic BP Percentile --      Diastolic BP Percentile --      Pulse Rate 08/24/22 1014 82     Resp 08/24/22 1014 18     Temp 08/24/22 1014 98 F (36.7 C)     Temp src --      SpO2 08/24/22 1014 100 %     Weight --      Height --      Head Circumference --      Peak Flow --      Pain Score 08/24/22 1013 6     Pain Loc --      Pain Education --      Exclude from Growth Chart --     Most recent vital signs: Vitals:   08/24/22 1412 08/24/22 1430  BP: (!) 149/97 (!) 166/94  Pulse: 90 90  Resp: 17 18  Temp:    SpO2: 100% 100%     Constitutional: Alert  Eyes: Conjunctivae are normal.  Head: Atraumatic. Nose: No congestion/rhinnorhea. Mouth/Throat: Mucous membranes are moist.   Neck: Painless ROM.  Cardiovascular:   Good peripheral circulation. Respiratory: Normal respiratory effort.  No retractions.  Gastrointestinal: Soft and nontender.  Musculoskeletal:  no deformity Neurologic:  MAE spontaneously.  Slurred speech.  Quick to fall asleep. Skin:  Skin is warm, dry and intact. No rash noted.     ED Results / Procedures / Treatments   Labs (all labs ordered are listed, but only abnormal results are  displayed) Labs Reviewed  CBC - Abnormal; Notable for the following components:      Result Value   RBC 3.39 (*)    Hemoglobin 8.4 (*)    HCT 27.1 (*)    MCV 79.9 (*)    MCH 24.8 (*)    RDW 18.9 (*)    nRBC 0.7 (*)    All other components within normal limits  BASIC METABOLIC PANEL - Abnormal; Notable for the following components:   Chloride 95 (*)    BUN 42 (*)    Creatinine, Ser 5.52 (*)    Calcium 8.2 (*)    GFR, Estimated 9 (*)    Anion gap 16 (*)    All other components within normal limits  COMPREHENSIVE METABOLIC PANEL - Abnormal; Notable for the following components:   Chloride 96 (*)    BUN 42 (*)    Creatinine, Ser 5.55 (*)    Calcium 8.3 (*)    Albumin 3.3 (*)    GFR, Estimated 9 (*)    Anion gap 17 (*)    All other  components within normal limits  CBC WITH DIFFERENTIAL/PLATELET - Abnormal; Notable for the following components:   RBC 3.44 (*)    Hemoglobin 8.5 (*)    HCT 28.4 (*)    MCH 24.7 (*)    MCHC 29.9 (*)    RDW 18.9 (*)    nRBC 0.7 (*)    All other components within normal limits  ETHANOL  PROTIME-INR  APTT  URINE DRUG SCREEN, QUALITATIVE ([facility] ONLY)  BLOOD GAS, VENOUS  CBG MONITORING, ED  TROPONIN I (HIGH SENSITIVITY)  TROPONIN I (HIGH SENSITIVITY)     EKG  ED ECG REPORT I, Willy Eddy, the attending physician, personally viewed and interpreted this ECG.   Date: 08/24/2022  EKG Time: 10:13  Rate: 80  Rhythm: sinus  Axis: normal  Intervals: normal  ST&T Change:  no stemi, no depressions    RADIOLOGY Please see ED Course for my review and interpretation.  I personally reviewed all radiographic images ordered to evaluate for the above acute complaints and reviewed radiology reports and findings.  These findings were personally discussed with the patient.  Please see medical record for radiology report.    PROCEDURES:  Critical Care performed:   Procedures   MEDICATIONS ORDERED IN ED: Medications  LORazepam (ATIVAN)  injection 2 mg (2 mg Intravenous Given 08/24/22 1322)  iohexol (OMNIPAQUE) 350 MG/ML injection 100 mL (100 mLs Intravenous Contrast Given 08/24/22 1316)     IMPRESSION / MDM / ASSESSMENT AND PLAN / ED COURSE  I reviewed the triage vital signs and the nursing notes.                              Differential diagnosis includes, but is not limited to, cva, tia, hypoglycemia, dehydration, electrolyte abnormality, dissection, sepsis  Patient presenting to the ER for evaluation of symptoms as described above.  Based on symptoms, risk factors and considered above differential, this presenting complaint could reflect a potentially life-threatening illness therefore the patient will be placed on continuous pulse oximetry and telemetry for monitoring.  Laboratory evaluation will be sent to evaluate for the above complaints.      Clinical Course as of 08/24/22 1518  Mon Aug 24, 2022  1249 Confirm last known well was around 1207.  Will call code stroke. [PR]  1249 Chest x-ray on my review and interpretation without evidence of consolidation. [PR]  1509 MRI limited due to motion artifact.  I do suspect this is likely polypharmacy but given her presentation risk factors with inconclusive MRI will consult hospitalist for admission. [PR]    Clinical Course User Index [PR] Willy Eddy, MD     FINAL CLINICAL IMPRESSION(S) / ED DIAGNOSES   Final diagnoses:  Altered mental status, unspecified altered mental status type     Rx / DC Orders   ED Discharge Orders     None        Note:  This document was prepared using Dragon voice recognition software and may include unintentional dictation errors.    Willy Eddy, MD 08/24/22 [phone]

## 2022-08-24 NOTE — Assessment & Plan Note (Signed)
PPI ?

## 2022-08-24 NOTE — Assessment & Plan Note (Signed)
2D ECHO 07/2022 w/ EF 60-65%  Fairly euvolemic s/p partial HD  Monitor volume status closely

## 2022-08-25 ENCOUNTER — Observation Stay: Payer: Medicare Other

## 2022-08-25 DIAGNOSIS — M1A00X0 Idiopathic chronic gout, unspecified site, without tophus (tophi): Secondary | ICD-10-CM | POA: Diagnosis not present

## 2022-08-25 DIAGNOSIS — E669 Obesity, unspecified: Secondary | ICD-10-CM

## 2022-08-25 DIAGNOSIS — I10 Essential (primary) hypertension: Secondary | ICD-10-CM

## 2022-08-25 DIAGNOSIS — E1129 Type 2 diabetes mellitus with other diabetic kidney complication: Secondary | ICD-10-CM

## 2022-08-25 DIAGNOSIS — E875 Hyperkalemia: Secondary | ICD-10-CM

## 2022-08-25 DIAGNOSIS — E785 Hyperlipidemia, unspecified: Secondary | ICD-10-CM

## 2022-08-25 DIAGNOSIS — D631 Anemia in chronic kidney disease: Secondary | ICD-10-CM

## 2022-08-25 DIAGNOSIS — N185 Chronic kidney disease, stage 5: Secondary | ICD-10-CM | POA: Diagnosis not present

## 2022-08-25 DIAGNOSIS — R079 Chest pain, unspecified: Secondary | ICD-10-CM | POA: Diagnosis not present

## 2022-08-25 DIAGNOSIS — G9340 Encephalopathy, unspecified: Secondary | ICD-10-CM | POA: Diagnosis not present

## 2022-08-25 LAB — COMPREHENSIVE METABOLIC PANEL
ALT: 9 U/L (ref 0–44)
AST: 23 U/L (ref 15–41)
Albumin: 3.1 g/dL — ABNORMAL LOW (ref 3.5–5.0)
Alkaline Phosphatase: 74 U/L (ref 38–126)
Anion gap: 12 (ref 5–15)
BUN: 60 mg/dL — ABNORMAL HIGH (ref 6–20)
CO2: 27 mmol/L (ref 22–32)
Calcium: 8.7 mg/dL — ABNORMAL LOW (ref 8.9–10.3)
Chloride: 94 mmol/L — ABNORMAL LOW (ref 98–111)
Creatinine, Ser: 7.63 mg/dL — ABNORMAL HIGH (ref 0.44–1.00)
GFR, Estimated: 6 mL/min — ABNORMAL LOW (ref >60)
Glucose, Bld: 94 mg/dL (ref 70–99)
Potassium: 6 mmol/L — ABNORMAL HIGH (ref 3.5–5.1)
Sodium: 133 mmol/L — ABNORMAL LOW (ref 135–145)
Total Bilirubin: 0.5 mg/dL (ref 0.3–1.2)
Total Protein: 6.9 g/dL (ref 6.5–8.1)

## 2022-08-25 LAB — CBC
HCT: 25.7 % — ABNORMAL LOW (ref 36.0–46.0)
Hemoglobin: 7.8 g/dL — ABNORMAL LOW (ref 12.0–15.0)
MCH: 24.5 pg — ABNORMAL LOW (ref 26.0–34.0)
MCHC: 30.4 g/dL (ref 30.0–36.0)
MCV: 80.8 fL (ref 80.0–100.0)
Platelets: 250 10*3/uL (ref 150–400)
RBC: 3.18 MIL/uL — ABNORMAL LOW (ref 3.87–5.11)
RDW: 18.6 % — ABNORMAL HIGH (ref 11.5–15.5)
WBC: 6.3 10*3/uL (ref 4.0–10.5)
nRBC: 0.6 % — ABNORMAL HIGH (ref 0.0–0.2)

## 2022-08-25 LAB — HEPATITIS B SURFACE ANTIGEN: Hepatitis B Surface Ag: NONREACTIVE

## 2022-08-25 LAB — GLUCOSE, CAPILLARY
Glucose-Capillary: 91 mg/dL (ref 70–99)
Glucose-Capillary: 100 mg/dL — ABNORMAL HIGH (ref 70–99)

## 2022-08-25 LAB — HIV ANTIBODY (ROUTINE TESTING W REFLEX): HIV Screen 4th Generation wRfx: NONREACTIVE

## 2022-08-25 LAB — HEMOGLOBIN A1C
Hgb A1c MFr Bld: 4.7 % — ABNORMAL LOW (ref 4.8–5.6)
Mean Plasma Glucose: 88.19 mg/dL

## 2022-08-25 MED ORDER — GABAPENTIN 100 MG PO CAPS
100.0000 mg | ORAL_CAPSULE | Freq: Three times a day (TID) | ORAL | Status: DC
  Administered 2022-08-25 – 2022-08-26 (×4): 100 mg via ORAL
  Filled 2022-08-25 (×4): qty 1

## 2022-08-25 MED ORDER — ACETAMINOPHEN 325 MG PO TABS
650.0000 mg | ORAL_TABLET | Freq: Four times a day (QID) | ORAL | Status: DC | PRN
  Administered 2022-08-25 – 2022-08-26 (×3): 650 mg via ORAL
  Filled 2022-08-25: qty 2

## 2022-08-25 MED ORDER — HEPARIN SODIUM (PORCINE) 1000 UNIT/ML DIALYSIS: 1000.0000 [IU] | INTRAMUSCULAR | Status: DC | PRN

## 2022-08-25 MED ORDER — LIDOCAINE-PRILOCAINE 2.5-2.5 % EX CREA: 1.0000 | TOPICAL_CREAM | CUTANEOUS | Status: DC | PRN

## 2022-08-25 MED ORDER — ONDANSETRON HCL 4 MG/2ML IJ SOLN
INTRAMUSCULAR | Status: AC
  Filled 2022-08-25: qty 2

## 2022-08-25 MED ORDER — ROPINIROLE HCL 1 MG PO TABS
1.0000 mg | ORAL_TABLET | Freq: Every day | ORAL | Status: DC
  Administered 2022-08-25: 1 mg via ORAL
  Filled 2022-08-25: qty 1

## 2022-08-25 MED ORDER — HEPARIN SODIUM (PORCINE) 1000 UNIT/ML DIALYSIS
25.0000 [IU]/kg | INTRAMUSCULAR | Status: DC | PRN
  Administered 2022-08-25: 2800 [IU] via INTRAVENOUS_CENTRAL

## 2022-08-25 MED ORDER — PENTAFLUOROPROP-TETRAFLUOROETH EX AERO: 1.0000 | INHALATION_SPRAY | CUTANEOUS | Status: DC | PRN

## 2022-08-25 MED ORDER — CHLORHEXIDINE GLUCONATE CLOTH 2 % EX PADS
6.0000 | MEDICATED_PAD | Freq: Every day | CUTANEOUS | Status: DC
  Administered 2022-08-25 – 2022-08-26 (×2): 6 via TOPICAL

## 2022-08-25 MED ORDER — HEPARIN SODIUM (PORCINE) 1000 UNIT/ML IJ SOLN
INTRAMUSCULAR | Status: AC
  Filled 2022-08-25: qty 10

## 2022-08-25 MED ORDER — ACETAMINOPHEN 325 MG PO TABS
ORAL_TABLET | ORAL | Status: AC
  Filled 2022-08-25: qty 2

## 2022-08-25 MED ORDER — CARBIDOPA-LEVODOPA 25-100 MG PO TABS
1.0000 | ORAL_TABLET | Freq: Three times a day (TID) | ORAL | Status: DC
  Administered 2022-08-25 – 2022-08-26 (×4): 1 via ORAL
  Filled 2022-08-25 (×4): qty 1

## 2022-08-25 NOTE — Progress Notes (Signed)
  Received patient in bed to unit.   Informed consent signed and in chart.    TX duration: 3 hrs     Hand-off given to patient's nurse.    Access used: L UA AVF  Access issues: none   Total UF removed: 3000  Medication(s) given: tylenol Post HD VS: WDL Post HD weight: 107.4 kg     [facility]

## 2022-08-25 NOTE — Progress Notes (Addendum)
Progress Note   Patient: Patricia [name] DOB: 01/18/1975 DOA: 08/24/2022     0 DOS: the patient was seen and examined on 08/25/2022   Brief hospital course: Patricia [name] is a 47 year old obese female with a past medical history of hypertension, coronary artery disease, congestive heart failure, hyperlipidemia, and end-stage renal disease on hemodialysis. Patient presents to ED with altered mental status. She has been admitted under observation for Encephalopathy   Assessment and Plan: * Acute encephalopathy Possible multifactorial toxic metabolic encephalopathy with component of polypharmacy and OHS, electrolyte abnormality. MRI of the brain also within normal limits Status post formal neuro evaluation, continue to correct metabolic issues.  No infectious etiology. Continue neuro cheks. Hold sedating medications  Chest pain Positive transient chest pain during hemodialysis No reported active chest pain at present Troponin negative x 2 EKG normal sinus rhythm Continue aspirin  ESRD (end stage renal disease) (HCC) Baseline ESRD on hemodialysis Monday Wednesday Friday Tolerating well HD Nephrology evaluation appreciated.  Hyperkalemia- Will repeat K level. HD session will help. Continue telemetry.  Hypertension BP stable  Titrate home regimen   Type 2 diabetes mellitus with other diabetic kidney complication (HCC) Continue accucheks, sliding scale,   (HFpEF) heart failure with preserved ejection fraction (HCC) 2D ECHO 07/2022 w/ EF 60-65%  Fairly euvolemic s/p partial HD  Monitor volume status closely  Obesity with BMI 37.08 Adversely affecting her current health status. Diet, exercise and weight reduction advised.  Chronic gouty arthritis Allopurinol   Anemia Baseline anemia of chronic disease Hemoglobin 8.5 today with baseline hemoglobin between 7 and 9 Monitor  GERD (gastroesophageal reflux disease) PPI    Dyslipidemia Cont zetia       Subjective: Patient is seen and examined today morning after HD. She is lying in bed, asks for her pain meds, requip. She has been having nausea every morning. Denies chest pain.  Physical Exam: Vitals:   08/25/22 1335 08/25/22 1400 08/25/22 1423 08/25/22 1625  BP: 133/74 (!) 144/82  (!) 145/68  Pulse: 87 87  88  Resp: 16 19  17   Temp:  98.1 F (36.7 C)    TempSrc:  Oral    SpO2: 99% 98%  100%  Weight:   107.4 kg   Height:       General - Elderly 47 obese African American female, no apparent distress HEENT - PERRLA, EOMI, atraumatic head, non tender sinuses. Lung - Clear, rales, rhonchi, wheezes. Heart - S1, S2 heard, no murmurs, rubs, trace pedal edema. Abdomen-soft, non distended, mild tenderness over right lower quadrant. Neuro - Alert, awake and oriented x 3, non focal exam. Skin - Warm and dry. Data Reviewed:     Latest Ref Rng & Units 08/25/2022    7:57 AM 08/24/2022   10:20 AM 08/12/2022    4:17 PM  CBC  WBC 4.0 - 10.5 K/uL 6.3  7.4    7.1  4.9   Hemoglobin 12.0 - 15.0 g/dL 7.8  8.4    8.5  7.3   Hematocrit 36.0 - 46.0 % 25.7  27.1    28.4  23.6   Platelets 150 - 400 K/uL 250  309    330  272        Latest Ref Rng & Units 08/25/2022    7:57 AM 08/24/2022   12:45 PM 08/24/2022   12:40 PM  BMP  Glucose 70 - 99 mg/dL 94  87  87   BUN 6 - 20 mg/dL 60  42  42   Creatinine 0.44 - 1.00 mg/dL 1.61  0.96  0.45   Sodium 135 - 145 mmol/L 133  136  136   Potassium 3.5 - 5.1 mmol/L 6.0  4.4  4.4   Chloride 98 - 111 mmol/L 94  96  95   CO2 22 - 32 mmol/L 27  23  25    Calcium 8.9 - 10.3 mg/dL 8.7  8.3  8.2     DG Knee 1-2 Views Left  Result Date: 08/25/2022 CLINICAL DATA:  Left knee pain and swelling EXAM: LEFT KNEE - 1-2 VIEW COMPARISON:  None Available. FINDINGS: No evidence of fracture, dislocation, or joint effusion. No evidence of arthropathy or other focal bone abnormality. Soft tissues are unremarkable. IMPRESSION: Negative.  Electronically Signed   By: Helyn Numbers M.D.   On: 08/25/2022 02:19   US Venous Img Lower Bilateral (DVT)  Result Date: 08/25/2022 CLINICAL DATA:  Bilateral lower extremity pain EXAM: BILATERAL LOWER EXTREMITY VENOUS DOPPLER ULTRASOUND TECHNIQUE: Gray-scale sonography with compression, as well as color and duplex ultrasound, were performed to evaluate the deep venous system(s) from the level of the common femoral vein through the popliteal and proximal calf veins. COMPARISON:  None Available. FINDINGS: VENOUS Normal compressibility of the common femoral, superficial femoral, and popliteal veins, as well as the visualized calf veins. Visualized portions of profunda femoral vein and great saphenous vein unremarkable. No filling defects to suggest DVT on grayscale or color Doppler imaging. Doppler waveforms show normal direction of venous flow, normal respiratory plasticity and response to augmentation. OTHER None. Limitations: none IMPRESSION: Negative. Electronically Signed   By: Helyn Numbers M.D.   On: 08/25/2022 02:18   MR BRAIN WO CONTRAST  Result Date: 08/24/2022 CLINICAL DATA:  Neuro deficit, acute, stroke suspected EXAM: MRI HEAD WITHOUT CONTRAST TECHNIQUE: Multiplanar, multiecho pulse sequences of the brain and surrounding structures were obtained without intravenous contrast. COMPARISON:  CT head from today FINDINGS: Motion limited study. Brain: No acute infarction, hemorrhage, hydrocephalus, extra-axial collection or mass lesion. Vascular: Major arterial flow voids are maintained at the skull base. Skull and upper cervical spine: Normal marrow signal. Sinuses/Orbits: Clear sinuses.  No acute orbital findings. Other: No mastoid effusions. IMPRESSION: Motion limited study without evidence of acute intracranial abnormality. Electronically Signed   By: Feliberto Harts M.D.   On: 08/24/2022 14:07   DG Chest 2 View  Result Date: 08/24/2022 CLINICAL DATA:  47 year old female with chest pain EXAM:  CHEST - 2 VIEW COMPARISON:  08/11/2022, 08/09/2022, 07/28/2022, chest CT 07/23/2022 FINDINGS: Cardiomediastinal silhouette unchanged. No pneumothorax.  No pleural effusion. No interlobular septal thickening. Low lung volumes. No new airspace disease. Improved aeration compared to the plain film of 08/11/2022 and 08/09/2022 Stent of the right upper extremity. IMPRESSION: Low lung volumes, without evidence of acute cardiopulmonary disease Electronically Signed   By: Gilmer Mor D.O.   On: 08/24/2022 13:13   CT HEAD CODE STROKE WO CONTRAST`  Result Date: 08/24/2022 CLINICAL DATA:  Code stroke.  Mental status change, unknown cause EXAM: CT HEAD WITHOUT CONTRAST TECHNIQUE: Contiguous axial images were obtained from the base of the skull through the vertex without intravenous contrast. RADIATION DOSE REDUCTION: This exam was performed according to the departmental dose-optimization program which includes automated exposure control, adjustment of the mA and/or kV according to patient size and/or use of iterative reconstruction technique. COMPARISON:  CT head February 7, 24. FINDINGS: Brain: No evidence of acute infarction, hemorrhage, hydrocephalus, extra-axial collection or mass lesion/mass effect. Vascular: No hyperdense  vessel. Skull: No acute fracture. Sinuses/Orbits: Clear sinuses.  No acute orbital findings Other: No mastoid effusions. ASPECTS [facility] Stroke Program Early CT Score) - Ganglionic level infarction (caudate, lentiform nuclei, internal capsule, insula, M1-M3 cortex): 10. IMPRESSION: 1. No evidence of acute intracranial abnormality. 2. ASPECTS is 10. Code stroke imaging results were communicated on 08/24/2022 at 12:58 pm to provider Bhagat via secure text paging. Electronically Signed   By: Feliberto Harts M.D.   On: 08/24/2022 12:59    Family Communication: patient and family at bedside understand and agree with current plan.  Disposition: Status is: Observation The patient remains OBS  appropriate and will d/c before 2 midnights.  Planned Discharge Destination: Home    Time spent: 44 minutes  Author: Marcelino Duster, MD 08/25/2022 6:22 PM  For on call review [email or web address].

## 2022-08-25 NOTE — Care Management Obs Status (Signed)
MEDICARE OBSERVATION STATUS NOTIFICATION   Patient Details  Name: Patricia [name] MRN: [id number] Date of Birth: [birth year]   Medicare Observation Status Notification Given:  Yes    Berlyn Malina, LCSW 08/25/2022, 3:29 PM

## 2022-08-25 NOTE — Progress Notes (Signed)
[facility]  ROUNDING NOTE   Subjective:   Patricia [name] is a 47 year old obese female with a past medical history of hypertension, coronary artery disease, congestive heart failure, hyperlipidemia, and end-stage renal disease on hemodialysis. Patient presents to ED with altered mental status. She has been admitted under observation for Encephalopathy [G93.40] Altered mental status, unspecified altered mental status type [R41.82]  Patient is known to our practice from previous admissions and receives outpatient dialysis treatments at [facility] on a MWF schedule, supervised by [facility] physicians). She received about 2 hours of her treatment when she began having chest discomfort. Also complains of shortness of breath during that time. When she presented to the ED, she was found to be lethargic and difficult to arouse. Code stroke called imaging negative.   Labs on ED arrival unremarkable for renal patient. However this morning, potassium 6.0. Imaging negative for CVA.  We have been consulted to continue dialysis during this admission.    Objective:  Vital signs in last 24 hours:  Temp:  [97.9 F (36.6 C)-99.2 F (37.3 C)] 97.9 F (36.6 C) (08/20 1014) Pulse Rate:  [78-96] 87 (08/20 1400) Resp:  [10-26] 19 (08/20 1400) BP: (119-184)/(59-99) 144/82 (08/20 1400) SpO2:  [91 %-100 %] 98 % (08/20 1400) Weight:  [107.4 kg-110.4 kg] 107.4 kg (08/20 1423)  Weight change:  Filed Weights   08/24/22 1700 08/25/22 1022 08/25/22 1423  Weight: 110 kg 110.4 kg 107.4 kg    Intake/Output: No intake/output data recorded.   Intake/Output this shift:  No intake/output data recorded.  Physical Exam: General: NAD  Head: Normocephalic, atraumatic. Moist oral mucosal membranes  Eyes: Anicteric  Lungs:  Clear to auscultation  Heart: Regular rate and rhythm  Abdomen:  Soft, nontender,   Extremities:  No  peripheral edema.  Neurologic:  Nonfocal, moving all four extremities  Skin: No lesions  Access: Rt AVF    Basic Metabolic Panel: Recent Labs  Lab 08/24/22 1240 08/24/22 1245 08/25/22 0757  NA 136 136 133*  K 4.4 4.4 6.0*  CL 95* 96* 94*  CO2 25 23 27   GLUCOSE 87 87 94  BUN 42* 42* 60*  CREATININE 5.52* 5.55* 7.63*  CALCIUM 8.2* 8.3* 8.7*    Liver Function Tests: Recent Labs  Lab 08/24/22 1245 08/25/22 0757  AST 26 23  ALT 11 9  ALKPHOS 72 74  BILITOT 0.4 0.5  PROT 7.1 6.9  ALBUMIN 3.3* 3.1*   No results for input(s): "LIPASE", "AMYLASE" in the last 168 hours. No results for input(s): "AMMONIA" in the last 168 hours.  CBC: Recent Labs  Lab 08/24/22 1020 08/25/22 0757  WBC 7.1  7.4 6.3  NEUTROABS 4.8  --   HGB 8.5*  8.4* 7.8*  HCT 28.4*  27.1* 25.7*  MCV 82.6  79.9* 80.8  PLT 330  309 250    Cardiac Enzymes: No results for input(s): "CKTOTAL", "CKMB", "CKMBINDEX", "TROPONINI" in the last 168 hours.  BNP: Invalid input(s): "POCBNP"  CBG: Recent Labs  Lab 08/24/22 1257 08/24/22 2115 08/25/22 0836  GLUCAP 77 128* 91    Microbiology: Results for orders placed or performed during the hospital encounter of 08/24/22  Resp panel by RT-PCR (RSV, Flu A&B, Covid) Anterior Nasal Swab     Status: None   Collection Time: 08/24/22  6:16 PM   Specimen: Anterior Nasal Swab  Result Value Ref Range Status   SARS Coronavirus 2 by RT PCR NEGATIVE NEGATIVE Final    Comment: (NOTE)  SARS-CoV-2 target nucleic acids are NOT DETECTED.  The SARS-CoV-2 RNA is generally detectable in upper respiratory specimens during the acute phase of infection. The lowest concentration of SARS-CoV-2 viral copies this assay can detect is 138 copies/mL. A negative result does not preclude SARS-Cov-2 infection and should not be used as the sole basis for treatment or other patient management decisions. A negative result may occur with  improper specimen collection/handling, submission of specimen other than  nasopharyngeal swab, presence of viral mutation(s) within the areas targeted by this assay, and inadequate number of viral copies(<138 copies/mL). A negative result must be combined with clinical observations, patient history, and epidemiological information. The expected result is Negative.  Fact Sheet for Patients:  BloggerCourse.com  Fact Sheet for Healthcare Providers:  SeriousBroker.it  This test is no t yet approved or cleared by the [place] FDA and  has been authorized for detection and/or diagnosis of SARS-CoV-2 by FDA under an Emergency Use Authorization (EUA). This EUA will remain  in effect (meaning this test can be used) for the duration of the COVID-19 declaration under Section 564(b)(1) of the Act, 21 U.S.C.section 360bbb-3(b)(1), unless the authorization is terminated  or revoked sooner.       Influenza A by PCR NEGATIVE NEGATIVE Final   Influenza B by PCR NEGATIVE NEGATIVE Final    Comment: (NOTE) The Xpert Xpress SARS-CoV-2/FLU/RSV plus assay is intended as an aid in the diagnosis of influenza from Nasopharyngeal swab specimens and should not be used as a sole basis for treatment. Nasal washings and aspirates are unacceptable for Xpert Xpress SARS-CoV-2/FLU/RSV testing.  Fact Sheet for Patients: BloggerCourse.com  Fact Sheet for Healthcare Providers: SeriousBroker.it  This test is not yet approved or cleared by the [place] FDA and has been authorized for detection and/or diagnosis of SARS-CoV-2 by FDA under an Emergency Use Authorization (EUA). This EUA will remain in effect (meaning this test can be used) for the duration of the COVID-19 declaration under Section 564(b)(1) of the Act, 21 U.S.C. section 360bbb-3(b)(1), unless the authorization is terminated or revoked.     Resp Syncytial Virus by PCR NEGATIVE NEGATIVE Final    Comment:  (NOTE) Fact Sheet for Patients: BloggerCourse.com  Fact Sheet for Healthcare Providers: SeriousBroker.it  This test is not yet approved or cleared by the [place] FDA and has been authorized for detection and/or diagnosis of SARS-CoV-2 by FDA under an Emergency Use Authorization (EUA). This EUA will remain in effect (meaning this test can be used) for the duration of the COVID-19 declaration under Section 564(b)(1) of the Act, 21 U.S.C. section 360bbb-3(b)(1), unless the authorization is terminated or revoked.  Performed at [facility], [address] [place], [place] [address]     Coagulation Studies: Recent Labs    08/24/22 1245  LABPROT 14.6  INR 1.1    Urinalysis: No results for input(s): "COLORURINE", "LABSPEC", "PHURINE", "GLUCOSEU", "HGBUR", "BILIRUBINUR", "KETONESUR", "PROTEINUR", "UROBILINOGEN", "NITRITE", "LEUKOCYTESUR" in the last 72 hours.  Invalid input(s): "APPERANCEUR"    Imaging: DG Knee 1-2 Views Left  Result Date: 08/25/2022 CLINICAL DATA:  Left knee pain and swelling EXAM: LEFT KNEE - 1-2 VIEW COMPARISON:  None Available. FINDINGS: No evidence of fracture, dislocation, or joint effusion. No evidence of arthropathy or other focal bone abnormality. Soft tissues are unremarkable. IMPRESSION: Negative. Electronically Signed   By: Helyn Numbers M.D.   On: 08/25/2022 02:19   US Venous Img Lower Bilateral (DVT)  Result Date: 08/25/2022 CLINICAL DATA:  Bilateral lower extremity pain  EXAM: BILATERAL LOWER EXTREMITY VENOUS DOPPLER ULTRASOUND TECHNIQUE: Gray-scale sonography with compression, as well as color and duplex ultrasound, were performed to evaluate the deep venous system(s) from the level of the common femoral vein through the popliteal and proximal calf veins. COMPARISON:  None Available. FINDINGS: VENOUS Normal compressibility of the common femoral, superficial femoral, and popliteal veins,  as well as the visualized calf veins. Visualized portions of profunda femoral vein and great saphenous vein unremarkable. No filling defects to suggest DVT on grayscale or color Doppler imaging. Doppler waveforms show normal direction of venous flow, normal respiratory plasticity and response to augmentation. OTHER None. Limitations: none IMPRESSION: Negative. Electronically Signed   By: Helyn Numbers M.D.   On: 08/25/2022 02:18   MR BRAIN WO CONTRAST  Result Date: 08/24/2022 CLINICAL DATA:  Neuro deficit, acute, stroke suspected EXAM: MRI HEAD WITHOUT CONTRAST TECHNIQUE: Multiplanar, multiecho pulse sequences of the brain and surrounding structures were obtained without intravenous contrast. COMPARISON:  CT head from today FINDINGS: Motion limited study. Brain: No acute infarction, hemorrhage, hydrocephalus, extra-axial collection or mass lesion. Vascular: Major arterial flow voids are maintained at the skull base. Skull and upper cervical spine: Normal marrow signal. Sinuses/Orbits: Clear sinuses.  No acute orbital findings. Other: No mastoid effusions. IMPRESSION: Motion limited study without evidence of acute intracranial abnormality. Electronically Signed   By: Feliberto Harts M.D.   On: 08/24/2022 14:07   DG Chest 2 View  Result Date: 08/24/2022 CLINICAL DATA:  47 year old female with chest pain EXAM: CHEST - 2 VIEW COMPARISON:  08/11/2022, 08/09/2022, 07/28/2022, chest CT 07/23/2022 FINDINGS: Cardiomediastinal silhouette unchanged. No pneumothorax.  No pleural effusion. No interlobular septal thickening. Low lung volumes. No new airspace disease. Improved aeration compared to the plain film of 08/11/2022 and 08/09/2022 Stent of the right upper extremity. IMPRESSION: Low lung volumes, without evidence of acute cardiopulmonary disease Electronically Signed   By: Gilmer Mor D.O.   On: 08/24/2022 13:13   CT HEAD CODE STROKE WO CONTRAST`  Result Date: 08/24/2022 CLINICAL DATA:  Code stroke.   Mental status change, unknown cause EXAM: CT HEAD WITHOUT CONTRAST TECHNIQUE: Contiguous axial images were obtained from the base of the skull through the vertex without intravenous contrast. RADIATION DOSE REDUCTION: This exam was performed according to the departmental dose-optimization program which includes automated exposure control, adjustment of the mA and/or kV according to patient size and/or use of iterative reconstruction technique. COMPARISON:  CT head February 7, 24. FINDINGS: Brain: No evidence of acute infarction, hemorrhage, hydrocephalus, extra-axial collection or mass lesion/mass effect. Vascular: No hyperdense vessel. Skull: No acute fracture. Sinuses/Orbits: Clear sinuses.  No acute orbital findings Other: No mastoid effusions. ASPECTS [facility] Stroke Program Early CT Score) - Ganglionic level infarction (caudate, lentiform nuclei, internal capsule, insula, M1-M3 cortex): 10. IMPRESSION: 1. No evidence of acute intracranial abnormality. 2. ASPECTS is 10. Code stroke imaging results were communicated on 08/24/2022 at 12:58 pm to provider Bhagat via secure text paging. Electronically Signed   By: Feliberto Harts M.D.   On: 08/24/2022 12:59     Medications:    sodium chloride      allopurinol  100 mg Oral QHS   amLODipine  10 mg Oral Daily   carbidopa-levodopa  1 tablet Oral TID   carvedilol  25 mg Oral BID WC   Chlorhexidine Gluconate Cloth  6 each Topical Q0600   DULoxetine  30 mg Oral Daily   gabapentin  100 mg Oral TID   heparin  5,000 Units Subcutaneous Q8H  hydrALAZINE  75 mg Oral Q8H   insulin aspart  0-6 Units Subcutaneous TID WC   isosorbide mononitrate  60 mg Oral QPM   lanthanum  2,000 mg Oral TID WC   multivitamin  1 tablet Oral QHS   pantoprazole  40 mg Oral Daily   sodium chloride flush  3 mL Intravenous Q12H   vortioxetine HBr  10 mg Oral Daily   sodium chloride, acetaminophen, ALPRAZolam, benzonatate, lanthanum **AND** lanthanum, ondansetron **OR**  ondansetron (ZOFRAN) IV, sodium chloride flush  Assessment/ Plan:  Patricia [name] is a 47 y.o.  female with a past medical history of hypertension, coronary artery disease, congestive heart failure, hyperlipidemia, and end-stage renal disease on hemodialysis. Patient presents to ED with altered mental status. She has been admitted under observation for Encephalopathy [G93.40] Altered mental status, unspecified altered mental status type [R41.82]  [facility]/MWF/Rt AVF  Hyperkalemia with end stage renal disease on hemodialysis. Completed 2 hours of treatment prior to ED arrival. Potassium 6.0, will perform shortened dialysis today on 1K bath to manage. Next treatment scheduled for Wednesday.   2. Anemia of chronic kidney disease  Lab Results  Component Value Date   HGB 7.8 (L) 08/25/2022    Hgb below desired target. Patient receives Mircera at outpatient clinic.  3. Secondary Hyperparathyroidism: with outpatient labs: None available Lab Results  Component Value Date   PTH 160 (H) 04/06/2016   CALCIUM 8.7 (L) 08/25/2022   CAION 0.99 (L) 02/11/2022   PHOS 5.8 (H) 08/12/2022    Calcium acceptable, phosphorus slightly elevated. No binders prescribed.   4. Hypertension with chronic kidney disease. Home regimen includes Amlodipine, carvedilol, hydralazine, and isosorbide.     LOS: 0   8/20/20244:15 PM

## 2022-08-26 DIAGNOSIS — R0789 Other chest pain: Secondary | ICD-10-CM | POA: Diagnosis not present

## 2022-08-26 DIAGNOSIS — I10 Essential (primary) hypertension: Secondary | ICD-10-CM | POA: Diagnosis not present

## 2022-08-26 DIAGNOSIS — G9340 Encephalopathy, unspecified: Secondary | ICD-10-CM | POA: Diagnosis not present

## 2022-08-26 DIAGNOSIS — E1129 Type 2 diabetes mellitus with other diabetic kidney complication: Secondary | ICD-10-CM | POA: Diagnosis not present

## 2022-08-26 LAB — CBC
HCT: 24.4 % — ABNORMAL LOW (ref 36.0–46.0)
Hemoglobin: 7.5 g/dL — ABNORMAL LOW (ref 12.0–15.0)
MCH: 24.5 pg — ABNORMAL LOW (ref 26.0–34.0)
MCHC: 30.7 g/dL (ref 30.0–36.0)
MCV: 79.7 fL — ABNORMAL LOW (ref 80.0–100.0)
Platelets: 212 10*3/uL (ref 150–400)
RBC: 3.06 MIL/uL — ABNORMAL LOW (ref 3.87–5.11)
RDW: 18.5 % — ABNORMAL HIGH (ref 11.5–15.5)
WBC: 6.7 10*3/uL (ref 4.0–10.5)
nRBC: 0.3 % — ABNORMAL HIGH (ref 0.0–0.2)

## 2022-08-26 LAB — BASIC METABOLIC PANEL
Anion gap: 8 (ref 5–15)
BUN: 44 mg/dL — ABNORMAL HIGH (ref 6–20)
CO2: 26 mmol/L (ref 22–32)
Calcium: 8.8 mg/dL — ABNORMAL LOW (ref 8.9–10.3)
Chloride: 99 mmol/L (ref 98–111)
Creatinine, Ser: 6.01 mg/dL — ABNORMAL HIGH (ref 0.44–1.00)
GFR, Estimated: 8 mL/min — ABNORMAL LOW (ref >60)
Glucose, Bld: 94 mg/dL (ref 70–99)
Potassium: 5.2 mmol/L — ABNORMAL HIGH (ref 3.5–5.1)
Sodium: 133 mmol/L — ABNORMAL LOW (ref 135–145)

## 2022-08-26 LAB — HEPATITIS B SURFACE ANTIBODY, QUANTITATIVE: Hep B S AB Quant (Post): 44.9 m[IU]/mL

## 2022-08-26 MED ORDER — PENTAFLUOROPROP-TETRAFLUOROETH EX AERO
INHALATION_SPRAY | CUTANEOUS | Status: AC
  Filled 2022-08-26: qty 30

## 2022-08-26 MED ORDER — ACETAMINOPHEN 325 MG PO TABS
ORAL_TABLET | ORAL | Status: AC
  Filled 2022-08-26: qty 2

## 2022-08-26 MED ORDER — ONDANSETRON HCL 4 MG/2ML IJ SOLN
INTRAMUSCULAR | Status: AC
  Filled 2022-08-26: qty 2

## 2022-08-26 MED ORDER — GABAPENTIN 100 MG PO CAPS
100.0000 mg | ORAL_CAPSULE | Freq: Every day | ORAL | Status: DC
  Administered 2022-08-26: 100 mg via ORAL
  Filled 2022-08-26: qty 1

## 2022-08-26 NOTE — Discharge Summary (Signed)
Physician Discharge Summary   Patient: Patricia [name] MRN: [id number] DOB: 1975-05-12  Admit date:     08/24/2022  Discharge date: 08/26/22  Discharge Physician: Marcelino Duster   PCP: Rick Duff, PA-C   Recommendations at discharge:    PCP follow up in 1 week  Discharge Diagnoses: Principal Problem:   Acute encephalopathy Active Problems:   Chest pain   ESRD (end stage renal disease) (HCC)   Hypertension   Type 2 diabetes mellitus with other diabetic kidney complication (HCC)   Dyslipidemia   GERD (gastroesophageal reflux disease)   Anemia   Chronic gouty arthritis   (HFpEF) heart failure with preserved ejection fraction (HCC)   Obesity (BMI 30-39.9)  Resolved Problems:   * No resolved hospital problems. [facility] Course: As per HPI - Patricia [name] is a 47 y.o. female with medical history significant of ESRD hemodialysis Monday Wednesday Friday, anemia of chronic disease, hypertension, type 2 diabetes, peripheral neuropathy, HFpEF presenting with encephalopathy, chest pain.  Limited history in setting of encephalopathy.  Per report, patient episode of chest pain approximately 2 hours into dialysis.  Was sent to the ER for further evaluation.  Upon arrival patient with noted lethargy at the bedside.  Was formally evaluated by neurology with concern for stroke.  CT head as well as MRI grossly within normal limits.  Patient currently sleeping but denies any active chest pain or shortness of breath on questioning.  No reported abdominal pain nausea vomiting. Presented to the ER afebrile, hemodynamically stable.  Saturation well on room air.  White count 7.4, hemoglobin 8.4, platelets 309, creatinine 5.55.  Chest x-ray, CT head, MRI grossly within normal limits. Neurology formally consulted, who advised MRI brain without contrast, metabolic workup and outpatient sleep study for concern of obstructive sleep apnea.  Patient is admitted to hospitalist  service for further management evaluation of acute toxic metabolic encephalopathy in the setting of hemodialysis, multiple pain medications, muscle relaxants.  Patient did not have any chest discomfort while in the hospital, troponins remain negative, EKG and telemetry unremarkable.  Patient had hemodialysis without any issues.  She does have nausea which is nothing new for her, advised home remedies, Zofran and Phenergan.  She is hemodynamically stable to be discharged home.  Advised her to follow-up with PCP, continue to be compliant with hemodialysis sessions.  No new medication changes made.  Patient understands and agrees with the discharge plan.  Assessment and Plan: * Acute encephalopathy Possible multifactorial toxic metabolic encephalopathy with component of polypharmacy, electrolyte abnormality. MRI of the brain also within normal limits seen by neurology team. No infectious etiology. Advised to be cautious with pain meds, gabapentin.    Chest pain - atypical Positive transient chest pain during hemodialysis No reported active chest pain at present Troponin negative x 2 EKG normal sinus rhythm, tele unremarkable. Continue aspirin   ESRD (end stage renal disease) (HCC) Baseline ESRD on hemodialysis Monday Wednesday Friday Tolerating well HD Nephrology evaluation appreciated.   Hyperkalemia- K improved with HD. HD sessions as scheduled, follow up BMP outpatient.   Hypertension BP stable  Continue current home medications.   Type 2 diabetes mellitus with other diabetic kidney complication (HCC) Continue accucheks, sliding scale,    (HFpEF) heart failure with preserved ejection fraction (HCC) 2D ECHO 07/2022 w/ EF 60-65%  Fairly euvolemic s/p partial HD  Monitor volume status closely   Obesity with BMI 37.08 Adversely affecting her current health status. Diet, exercise and weight reduction advised.  Chronic gouty arthritis Allopurinol    Anemia of chronic  disease Baseline anemia of chronic disease Baseline hemoglobin between 7 and 9 Outpatient follow up suggested.   GERD (gastroesophageal reflux disease) PPI    Dyslipidemia Cont zetia        Consultants: Nephrology Procedures performed: none  Disposition: Home Diet recommendation:  Discharge Diet Orders (From admission, onward)     Start     Ordered   08/26/22 0000  Diet - low sodium heart healthy        08/26/22 1453           Renal diet DISCHARGE MEDICATION: Allergies as of 08/26/2022       Reactions   Nsaids Other (See Comments)   Cannot take due to Kidney disease/Kidney function Other Reaction(s): Unknown   Tolmetin Other (See Comments)   Cannot take due to Kidney Disease        Medication List     STOP taking these medications    ezetimibe 10 MG tablet Commonly known as: ZETIA       TAKE these medications    acetaminophen 500 MG tablet Commonly known as: TYLENOL Take 1,000 mg by mouth every 6 (six) hours as needed for moderate pain.   allopurinol 100 MG tablet Commonly known as: ZYLOPRIM Take 100 mg by mouth at bedtime.   ALPRAZolam 1 MG tablet Commonly known as: XANAX Take 1 mg by mouth 3 (three) times daily as needed for anxiety or sleep.   amLODipine 10 MG tablet Commonly known as: NORVASC Take 1 tablet (10 mg total) by mouth daily.   ascorbic acid 500 MG tablet Commonly known as: VITAMIN C Take 1 tablet (500 mg total) by mouth 2 (two) times daily.   benzonatate 200 MG capsule Commonly known as: TESSALON Take 1 capsule (200 mg total) by mouth 3 (three) times daily as needed for cough.   carbidopa-levodopa 25-100 MG tablet Commonly known as: SINEMET IR TAKE 1 TABLET BY MOUTH THREE TIMES DAILY   carvedilol 25 MG tablet Commonly known as: COREG Take 1 tablet (25 mg total) by mouth 2 (two) times daily with a meal.   chlorpheniramine-HYDROcodone 10-8 MG/5ML Commonly known as: TUSSIONEX Take 5 mLs by mouth every 12 (twelve)  hours as needed for cough.   Diclofenac Sodium 1 % Crea 1 gram qid prn   DULoxetine 30 MG capsule Commonly known as: Cymbalta Take 1 capsule (30 mg total) by mouth daily.   gabapentin 100 MG capsule Commonly known as: NEURONTIN TAKE 3 CAPSULES(300 MG) BY MOUTH THREE TIMES DAILY   hydrALAZINE 25 MG tablet Commonly known as: APRESOLINE Take 3 tablets (75 mg total) by mouth every 8 (eight) hours.   hydrocortisone 2.5 % rectal cream Commonly known as: Anusol-HC Place 1 Application rectally 2 (two) times daily.   hydrOXYzine 25 MG tablet Commonly known as: ATARAX Take 25 mg by mouth every 6 (six) hours as needed for itching.   isosorbide mononitrate 60 MG 24 hr tablet Commonly known as: IMDUR Take 1 tablet (60 mg total) by mouth every evening. Skip the dose if systolic BP less than 140 mmHg   lanthanum 1000 MG chewable tablet Commonly known as: FOSRENOL Chew 1,000-2,000 mg by mouth See admin instructions. Take 2 tablets (2000mg ) by mouth three times daily with meals and take 1 tablet (1000mg ) by mouth twice daily with snacks   lidocaine-prilocaine cream Commonly known as: EMLA 1gram tid prn   multivitamin Tabs tablet Take 1 tablet by mouth at  bedtime.   omeprazole 40 MG capsule Commonly known as: PRILOSEC Take 1 capsule (40 mg total) by mouth daily.   ondansetron 4 MG tablet Commonly known as: ZOFRAN Take 4 mg by mouth 2 (two) times daily as needed for nausea or vomiting.   polyethylene glycol 17 g packet Commonly known as: MiraLax Take 17 g by mouth daily. What changed:  when to take this reasons to take this   pregabalin 75 MG capsule Commonly known as: LYRICA Take 75 mg by mouth at bedtime.   promethazine 25 MG tablet Commonly known as: PHENERGAN Take 25 mg by mouth 3 (three) times daily as needed for nausea or vomiting.   rOPINIRole 1 MG tablet Commonly known as: REQUIP Take 1 tablet (1 mg total) by mouth at bedtime.   vortioxetine HBr 10 MG Tabs  tablet Commonly known as: TRINTELLIX Take 10 mg by mouth daily.   zinc sulfate 220 (50 Zn) MG capsule Take 1 capsule (220 mg total) by mouth daily.   zolpidem 5 MG tablet Commonly known as: AMBIEN Take 5 mg by mouth at bedtime.        Discharge Exam: Filed Weights   08/25/22 1022 08/25/22 1423 08/26/22 1005  Weight: 110.4 kg 107.4 kg 108.5 kg   General - Elderly obese African American female, no apparent distress HEENT - PERRLA, EOMI, atraumatic head, non tender sinuses. Lung - Clear, rales, rhonchi, wheezes. Heart - S1, S2 heard, no murmurs, rubs, trace pedal edema. Abdomen-soft, non distended, mild tenderness over right lower quadrant. Neuro - Alert, awake and oriented x 3, non focal exam. Skin - Warm and dry.  Condition at discharge: stable  The results of significant diagnostics from this hospitalization (including imaging, microbiology, ancillary and laboratory) are listed below for reference.   Imaging Studies: DG Knee 1-2 Views Left  Result Date: 08/25/2022 CLINICAL DATA:  Left knee pain and swelling EXAM: LEFT KNEE - 1-2 VIEW COMPARISON:  None Available. FINDINGS: No evidence of fracture, dislocation, or joint effusion. No evidence of arthropathy or other focal bone abnormality. Soft tissues are unremarkable. IMPRESSION: Negative. Electronically Signed   By: Helyn Numbers M.D.   On: 08/25/2022 02:19   US Venous Img Lower Bilateral (DVT)  Result Date: 08/25/2022 CLINICAL DATA:  Bilateral lower extremity pain EXAM: BILATERAL LOWER EXTREMITY VENOUS DOPPLER ULTRASOUND TECHNIQUE: Gray-scale sonography with compression, as well as color and duplex ultrasound, were performed to evaluate the deep venous system(s) from the level of the common femoral vein through the popliteal and proximal calf veins. COMPARISON:  None Available. FINDINGS: VENOUS Normal compressibility of the common femoral, superficial femoral, and popliteal veins, as well as the visualized calf veins.  Visualized portions of profunda femoral vein and great saphenous vein unremarkable. No filling defects to suggest DVT on grayscale or color Doppler imaging. Doppler waveforms show normal direction of venous flow, normal respiratory plasticity and response to augmentation. OTHER None. Limitations: none IMPRESSION: Negative. Electronically Signed   By: Helyn Numbers M.D.   On: 08/25/2022 02:18   MR BRAIN WO CONTRAST  Result Date: 08/24/2022 CLINICAL DATA:  Neuro deficit, acute, stroke suspected EXAM: MRI HEAD WITHOUT CONTRAST TECHNIQUE: Multiplanar, multiecho pulse sequences of the brain and surrounding structures were obtained without intravenous contrast. COMPARISON:  CT head from today FINDINGS: Motion limited study. Brain: No acute infarction, hemorrhage, hydrocephalus, extra-axial collection or mass lesion. Vascular: Major arterial flow voids are maintained at the skull base. Skull and upper cervical spine: Normal marrow signal. Sinuses/Orbits: Clear sinuses.  No acute orbital findings. Other: No mastoid effusions. IMPRESSION: Motion limited study without evidence of acute intracranial abnormality. Electronically Signed   By: Feliberto Harts M.D.   On: 08/24/2022 14:07   DG Chest 2 View  Result Date: 08/24/2022 CLINICAL DATA:  47 year old female with chest pain EXAM: CHEST - 2 VIEW COMPARISON:  08/11/2022, 08/09/2022, 07/28/2022, chest CT 07/23/2022 FINDINGS: Cardiomediastinal silhouette unchanged. No pneumothorax.  No pleural effusion. No interlobular septal thickening. Low lung volumes. No new airspace disease. Improved aeration compared to the plain film of 08/11/2022 and 08/09/2022 Stent of the right upper extremity. IMPRESSION: Low lung volumes, without evidence of acute cardiopulmonary disease Electronically Signed   By: Gilmer Mor D.O.   On: 08/24/2022 13:13   CT HEAD CODE STROKE WO CONTRAST`  Result Date: 08/24/2022 CLINICAL DATA:  Code stroke.  Mental status change, unknown cause EXAM:  CT HEAD WITHOUT CONTRAST TECHNIQUE: Contiguous axial images were obtained from the base of the skull through the vertex without intravenous contrast. RADIATION DOSE REDUCTION: This exam was performed according to the departmental dose-optimization program which includes automated exposure control, adjustment of the mA and/or kV according to patient size and/or use of iterative reconstruction technique. COMPARISON:  CT head February 7, 24. FINDINGS: Brain: No evidence of acute infarction, hemorrhage, hydrocephalus, extra-axial collection or mass lesion/mass effect. Vascular: No hyperdense vessel. Skull: No acute fracture. Sinuses/Orbits: Clear sinuses.  No acute orbital findings Other: No mastoid effusions. ASPECTS [facility] Stroke Program Early CT Score) - Ganglionic level infarction (caudate, lentiform nuclei, internal capsule, insula, M1-M3 cortex): 10. IMPRESSION: 1. No evidence of acute intracranial abnormality. 2. ASPECTS is 10. Code stroke imaging results were communicated on 08/24/2022 at 12:58 pm to provider Bhagat via secure text paging. Electronically Signed   By: Feliberto Harts M.D.   On: 08/24/2022 12:59   DG Chest Port 1 View  Result Date: 08/11/2022 CLINICAL DATA:  Shortness of breath EXAM: PORTABLE CHEST 1 VIEW COMPARISON:  08/09/2022 FINDINGS: Heart size remains mildly enlarged. Mild pulmonary vascular congestion. Improved aeration within the right lung compared to prior. No new focal airspace consolidation. No pleural effusion or pneumothorax. Right axillary vascular stent. IMPRESSION: 1. Mild cardiomegaly and pulmonary vascular congestion. 2. Improved aeration within the right lung compared to prior. Electronically Signed   By: Duanne Guess D.O.   On: 08/11/2022 10:15   DG Chest 2 View  Result Date: 08/09/2022 CLINICAL DATA:  Chest pain, cough, and shortness of breath. EXAM: CHEST - 2 VIEW COMPARISON:  Chest x-ray dated July 28, 2022. CT chest dated July 23, 2022. FINDINGS: Unchanged  borderline cardiomegaly. New ill-defined hazy opacity in the right upper and lower lobes. Trace right pleural effusion. No pneumothorax. No acute osseous abnormality. IMPRESSION: 1. New ill-defined hazy opacity in the right upper and lower lobes, favor asymmetric pulmonary edema. 2. Trace right pleural effusion. Electronically Signed   By: Obie Dredge M.D.   On: 08/09/2022 11:17   DG Chest 2 View  Result Date: 07/28/2022 CLINICAL DATA:  Shortness of breath. EXAM: CHEST - 2 VIEW COMPARISON:  07/23/2022. FINDINGS: Improved aeration of the lungs. Low lung volumes accentuate the pulmonary vasculature and cardiomediastinal silhouette. No consolidation or pulmonary edema. No pleural effusion or pneumothorax. IMPRESSION: Low lung volumes without evidence of acute cardiopulmonary disease. Electronically Signed   By: Orvan Falconer M.D.   On: 07/28/2022 13:08    Microbiology: Results for orders placed or performed during the hospital encounter of 08/24/22  Resp panel by RT-PCR (RSV, Flu  A&B, Covid) Anterior Nasal Swab     Status: None   Collection Time: 08/24/22  6:16 PM   Specimen: Anterior Nasal Swab  Result Value Ref Range Status   SARS Coronavirus 2 by RT PCR NEGATIVE NEGATIVE Final    Comment: (NOTE) SARS-CoV-2 target nucleic acids are NOT DETECTED.  The SARS-CoV-2 RNA is generally detectable in upper respiratory specimens during the acute phase of infection. The lowest concentration of SARS-CoV-2 viral copies this assay can detect is 138 copies/mL. A negative result does not preclude SARS-Cov-2 infection and should not be used as the sole basis for treatment or other patient management decisions. A negative result may occur with  improper specimen collection/handling, submission of specimen other than nasopharyngeal swab, presence of viral mutation(s) within the areas targeted by this assay, and inadequate number of viral copies(<138 copies/mL). A negative result must be combined  with clinical observations, patient history, and epidemiological information. The expected result is Negative.  Fact Sheet for Patients:  BloggerCourse.com  Fact Sheet for Healthcare Providers:  SeriousBroker.it  This test is no t yet approved or cleared by the [place] FDA and  has been authorized for detection and/or diagnosis of SARS-CoV-2 by FDA under an Emergency Use Authorization (EUA). This EUA will remain  in effect (meaning this test can be used) for the duration of the COVID-19 declaration under Section 564(b)(1) of the Act, 21 U.S.C.section 360bbb-3(b)(1), unless the authorization is terminated  or revoked sooner.       Influenza A by PCR NEGATIVE NEGATIVE Final   Influenza B by PCR NEGATIVE NEGATIVE Final    Comment: (NOTE) The Xpert Xpress SARS-CoV-2/FLU/RSV plus assay is intended as an aid in the diagnosis of influenza from Nasopharyngeal swab specimens and should not be used as a sole basis for treatment. Nasal washings and aspirates are unacceptable for Xpert Xpress SARS-CoV-2/FLU/RSV testing.  Fact Sheet for Patients: BloggerCourse.com  Fact Sheet for Healthcare Providers: SeriousBroker.it  This test is not yet approved or cleared by the [place] FDA and has been authorized for detection and/or diagnosis of SARS-CoV-2 by FDA under an Emergency Use Authorization (EUA). This EUA will remain in effect (meaning this test can be used) for the duration of the COVID-19 declaration under Section 564(b)(1) of the Act, 21 U.S.C. section 360bbb-3(b)(1), unless the authorization is terminated or revoked.     Resp Syncytial Virus by PCR NEGATIVE NEGATIVE Final    Comment: (NOTE) Fact Sheet for Patients: BloggerCourse.com  Fact Sheet for Healthcare Providers: SeriousBroker.it  This test is not yet approved  or cleared by the [place] FDA and has been authorized for detection and/or diagnosis of SARS-CoV-2 by FDA under an Emergency Use Authorization (EUA). This EUA will remain in effect (meaning this test can be used) for the duration of the COVID-19 declaration under Section 564(b)(1) of the Act, 21 U.S.C. section 360bbb-3(b)(1), unless the authorization is terminated or revoked.  Performed at [facility], [address] [place], [place] [address]     Labs: CBC: Recent Labs  Lab 08/24/22 1020 08/25/22 0757 08/26/22 0637  WBC 7.1  7.4 6.3 6.7  NEUTROABS 4.8  --   --   HGB 8.5*  8.4* 7.8* 7.5*  HCT 28.4*  27.1* 25.7* 24.4*  MCV 82.6  79.9* 80.8 79.7*  PLT 330  309 250 212   Basic Metabolic Panel: Recent Labs  Lab 08/24/22 1240 08/24/22 1245 08/25/22 0757 08/26/22 0637  NA 136 136 133* 133*  K 4.4 4.4 6.0*  5.2*  CL 95* 96* 94* 99  CO2 25 23 27 26   GLUCOSE 87 87 94 94  BUN 42* 42* 60* 44*  CREATININE 5.52* 5.55* 7.63* 6.01*  CALCIUM 8.2* 8.3* 8.7* 8.8*   Liver Function Tests: Recent Labs  Lab 08/24/22 1245 08/25/22 0757  AST 26 23  ALT 11 9  ALKPHOS 72 74  BILITOT 0.4 0.5  PROT 7.1 6.9  ALBUMIN 3.3* 3.1*   CBG: Recent Labs  Lab 08/24/22 1257 08/24/22 2115 08/25/22 0836 08/25/22 1626  GLUCAP 77 128* 91 100*    Discharge time spent: greater than 30 minutes.  Signed: Marcelino Duster, MD [facility] 08/26/2022

## 2022-08-26 NOTE — Progress Notes (Signed)
[facility]  ROUNDING NOTE   Subjective:   Patricia [name] is a 47 year old obese female with a past medical history of hypertension, coronary artery disease, congestive heart failure, hyperlipidemia, and end-stage renal disease on hemodialysis. Patient presents to ED with altered mental status. She has been admitted under observation for Encephalopathy [G93.40] Altered mental status, unspecified altered mental status type [R41.82]  Patient is known to our practice from previous admissions and receives outpatient dialysis treatments at [facility] on a MWF schedule, supervised by [facility] physicians).  Update Patient seen and evaluated during dialysis   HEMODIALYSIS FLOWSHEET:  Blood Flow Rate (mL/min): 400 mL/min Arterial Pressure (mmHg): -90 mmHg Venous Pressure (mmHg): 190 mmHg TMP (mmHg): 14 mmHg Ultrafiltration Rate (mL/min): 829 mL/min Dialysate Flow Rate (mL/min): 300 ml/min  Tolerating treatment well Denies pain or discomfort States she is ready for discharge   Objective:  Vital signs in last 24 hours:  Temp:  [97.7 F (36.5 C)-98.4 F (36.9 C)] 98.2 F (36.8 C) (08/21 0959) Pulse Rate:  [78-89] 80 (08/21 1030) Resp:  [12-26] 13 (08/21 1030) BP: (119-184)/(59-114) 169/88 (08/21 1030) SpO2:  [91 %-100 %] 99 % (08/21 1030) Weight:  [107.4 kg-108.5 kg] 108.5 kg (08/21 1005)  Weight change: 0.4 kg Filed Weights   08/25/22 1022 08/25/22 1423 08/26/22 1005  Weight: 110.4 kg 107.4 kg 108.5 kg    Intake/Output: No intake/output data recorded.   Intake/Output this shift:  No intake/output data recorded.  Physical Exam: General: NAD  Head: Normocephalic, atraumatic. Moist oral mucosal membranes  Eyes: Anicteric  Lungs:  Clear to auscultation  Heart: Regular rate and rhythm  Abdomen:  Soft, nontender,   Extremities:  No  peripheral edema.  Neurologic: Nonfocal, moving all four extremities  Skin: No lesions   Access: Rt AVF    Basic Metabolic Panel: Recent Labs  Lab 08/24/22 1240 08/24/22 1245 08/25/22 0757 08/26/22 0637  NA 136 136 133* 133*  K 4.4 4.4 6.0* 5.2*  CL 95* 96* 94* 99  CO2 25 23 27 26   GLUCOSE 87 87 94 94  BUN 42* 42* 60* 44*  CREATININE 5.52* 5.55* 7.63* 6.01*  CALCIUM 8.2* 8.3* 8.7* 8.8*    Liver Function Tests: Recent Labs  Lab 08/24/22 1245 08/25/22 0757  AST 26 23  ALT 11 9  ALKPHOS 72 74  BILITOT 0.4 0.5  PROT 7.1 6.9  ALBUMIN 3.3* 3.1*   No results for input(s): "LIPASE", "AMYLASE" in the last 168 hours. No results for input(s): "AMMONIA" in the last 168 hours.  CBC: Recent Labs  Lab 08/24/22 1020 08/25/22 0757 08/26/22 0637  WBC 7.1  7.4 6.3 6.7  NEUTROABS 4.8  --   --   HGB 8.5*  8.4* 7.8* 7.5*  HCT 28.4*  27.1* 25.7* 24.4*  MCV 82.6  79.9* 80.8 79.7*  PLT 330  309 250 212    Cardiac Enzymes: No results for input(s): "CKTOTAL", "CKMB", "CKMBINDEX", "TROPONINI" in the last 168 hours.  BNP: Invalid input(s): "POCBNP"  CBG: Recent Labs  Lab 08/24/22 1257 08/24/22 2115 08/25/22 0836 08/25/22 1626  GLUCAP 77 128* 91 100*    Microbiology: Results for orders placed or performed during the hospital encounter of 08/24/22  Resp panel by RT-PCR (RSV, Flu A&B, Covid) Anterior Nasal Swab     Status: None   Collection Time: 08/24/22  6:16 PM   Specimen: Anterior Nasal Swab  Result Value Ref Range Status   SARS Coronavirus 2 by RT PCR NEGATIVE  NEGATIVE Final    Comment: (NOTE) SARS-CoV-2 target nucleic acids are NOT DETECTED.  The SARS-CoV-2 RNA is generally detectable in upper respiratory specimens during the acute phase of infection. The lowest concentration of SARS-CoV-2 viral copies this assay can detect is 138 copies/mL. A negative result does not preclude SARS-Cov-2 infection and should not be used as the sole basis for treatment or other patient management decisions. A negative result may occur with  improper specimen  collection/handling, submission of specimen other than nasopharyngeal swab, presence of viral mutation(s) within the areas targeted by this assay, and inadequate number of viral copies(<138 copies/mL). A negative result must be combined with clinical observations, patient history, and epidemiological information. The expected result is Negative.  Fact Sheet for Patients:  BloggerCourse.com  Fact Sheet for Healthcare Providers:  SeriousBroker.it  This test is no t yet approved or cleared by the [place] FDA and  has been authorized for detection and/or diagnosis of SARS-CoV-2 by FDA under an Emergency Use Authorization (EUA). This EUA will remain  in effect (meaning this test can be used) for the duration of the COVID-19 declaration under Section 564(b)(1) of the Act, 21 U.S.C.section 360bbb-3(b)(1), unless the authorization is terminated  or revoked sooner.       Influenza A by PCR NEGATIVE NEGATIVE Final   Influenza B by PCR NEGATIVE NEGATIVE Final    Comment: (NOTE) The Xpert Xpress SARS-CoV-2/FLU/RSV plus assay is intended as an aid in the diagnosis of influenza from Nasopharyngeal swab specimens and should not be used as a sole basis for treatment. Nasal washings and aspirates are unacceptable for Xpert Xpress SARS-CoV-2/FLU/RSV testing.  Fact Sheet for Patients: BloggerCourse.com  Fact Sheet for Healthcare Providers: SeriousBroker.it  This test is not yet approved or cleared by the [place] FDA and has been authorized for detection and/or diagnosis of SARS-CoV-2 by FDA under an Emergency Use Authorization (EUA). This EUA will remain in effect (meaning this test can be used) for the duration of the COVID-19 declaration under Section 564(b)(1) of the Act, 21 U.S.C. section 360bbb-3(b)(1), unless the authorization is terminated or revoked.     Resp Syncytial  Virus by PCR NEGATIVE NEGATIVE Final    Comment: (NOTE) Fact Sheet for Patients: BloggerCourse.com  Fact Sheet for Healthcare Providers: SeriousBroker.it  This test is not yet approved or cleared by the [place] FDA and has been authorized for detection and/or diagnosis of SARS-CoV-2 by FDA under an Emergency Use Authorization (EUA). This EUA will remain in effect (meaning this test can be used) for the duration of the COVID-19 declaration under Section 564(b)(1) of the Act, 21 U.S.C. section 360bbb-3(b)(1), unless the authorization is terminated or revoked.  Performed at [facility], [address] [place], [place] [address]     Coagulation Studies: Recent Labs    08/24/22 1245  LABPROT 14.6  INR 1.1    Urinalysis: No results for input(s): "COLORURINE", "LABSPEC", "PHURINE", "GLUCOSEU", "HGBUR", "BILIRUBINUR", "KETONESUR", "PROTEINUR", "UROBILINOGEN", "NITRITE", "LEUKOCYTESUR" in the last 72 hours.  Invalid input(s): "APPERANCEUR"    Imaging: DG Knee 1-2 Views Left  Result Date: 08/25/2022 CLINICAL DATA:  Left knee pain and swelling EXAM: LEFT KNEE - 1-2 VIEW COMPARISON:  None Available. FINDINGS: No evidence of fracture, dislocation, or joint effusion. No evidence of arthropathy or other focal bone abnormality. Soft tissues are unremarkable. IMPRESSION: Negative. Electronically Signed   By: Helyn Numbers M.D.   On: 08/25/2022 02:19   US Venous Img Lower Bilateral (DVT)  Result Date: 08/25/2022  CLINICAL DATA:  Bilateral lower extremity pain EXAM: BILATERAL LOWER EXTREMITY VENOUS DOPPLER ULTRASOUND TECHNIQUE: Gray-scale sonography with compression, as well as color and duplex ultrasound, were performed to evaluate the deep venous system(s) from the level of the common femoral vein through the popliteal and proximal calf veins. COMPARISON:  None Available. FINDINGS: VENOUS Normal compressibility of the common  femoral, superficial femoral, and popliteal veins, as well as the visualized calf veins. Visualized portions of profunda femoral vein and great saphenous vein unremarkable. No filling defects to suggest DVT on grayscale or color Doppler imaging. Doppler waveforms show normal direction of venous flow, normal respiratory plasticity and response to augmentation. OTHER None. Limitations: none IMPRESSION: Negative. Electronically Signed   By: Helyn Numbers M.D.   On: 08/25/2022 02:18   MR BRAIN WO CONTRAST  Result Date: 08/24/2022 CLINICAL DATA:  Neuro deficit, acute, stroke suspected EXAM: MRI HEAD WITHOUT CONTRAST TECHNIQUE: Multiplanar, multiecho pulse sequences of the brain and surrounding structures were obtained without intravenous contrast. COMPARISON:  CT head from today FINDINGS: Motion limited study. Brain: No acute infarction, hemorrhage, hydrocephalus, extra-axial collection or mass lesion. Vascular: Major arterial flow voids are maintained at the skull base. Skull and upper cervical spine: Normal marrow signal. Sinuses/Orbits: Clear sinuses.  No acute orbital findings. Other: No mastoid effusions. IMPRESSION: Motion limited study without evidence of acute intracranial abnormality. Electronically Signed   By: Feliberto Harts M.D.   On: 08/24/2022 14:07   DG Chest 2 View  Result Date: 08/24/2022 CLINICAL DATA:  47 year old female with chest pain EXAM: CHEST - 2 VIEW COMPARISON:  08/11/2022, 08/09/2022, 07/28/2022, chest CT 07/23/2022 FINDINGS: Cardiomediastinal silhouette unchanged. No pneumothorax.  No pleural effusion. No interlobular septal thickening. Low lung volumes. No new airspace disease. Improved aeration compared to the plain film of 08/11/2022 and 08/09/2022 Stent of the right upper extremity. IMPRESSION: Low lung volumes, without evidence of acute cardiopulmonary disease Electronically Signed   By: Gilmer Mor D.O.   On: 08/24/2022 13:13   CT HEAD CODE STROKE WO CONTRAST`  Result  Date: 08/24/2022 CLINICAL DATA:  Code stroke.  Mental status change, unknown cause EXAM: CT HEAD WITHOUT CONTRAST TECHNIQUE: Contiguous axial images were obtained from the base of the skull through the vertex without intravenous contrast. RADIATION DOSE REDUCTION: This exam was performed according to the departmental dose-optimization program which includes automated exposure control, adjustment of the mA and/or kV according to patient size and/or use of iterative reconstruction technique. COMPARISON:  CT head February 7, 24. FINDINGS: Brain: No evidence of acute infarction, hemorrhage, hydrocephalus, extra-axial collection or mass lesion/mass effect. Vascular: No hyperdense vessel. Skull: No acute fracture. Sinuses/Orbits: Clear sinuses.  No acute orbital findings Other: No mastoid effusions. ASPECTS [facility] Stroke Program Early CT Score) - Ganglionic level infarction (caudate, lentiform nuclei, internal capsule, insula, M1-M3 cortex): 10. IMPRESSION: 1. No evidence of acute intracranial abnormality. 2. ASPECTS is 10. Code stroke imaging results were communicated on 08/24/2022 at 12:58 pm to provider Bhagat via secure text paging. Electronically Signed   By: Feliberto Harts M.D.   On: 08/24/2022 12:59     Medications:    sodium chloride      allopurinol  100 mg Oral QHS   amLODipine  10 mg Oral Daily   carbidopa-levodopa  1 tablet Oral TID   carvedilol  25 mg Oral BID WC   Chlorhexidine Gluconate Cloth  6 each Topical Q0600   DULoxetine  30 mg Oral Daily   gabapentin  100 mg Oral TID  gabapentin  100 mg Oral QHS   heparin  5,000 Units Subcutaneous Q8H   hydrALAZINE  75 mg Oral Q8H   insulin aspart  0-6 Units Subcutaneous TID WC   isosorbide mononitrate  60 mg Oral QPM   lanthanum  2,000 mg Oral TID WC   multivitamin  1 tablet Oral QHS   pantoprazole  40 mg Oral Daily   rOPINIRole  1 mg Oral QHS   sodium chloride flush  3 mL Intravenous Q12H   vortioxetine HBr  10 mg Oral Daily   sodium  chloride, acetaminophen, ALPRAZolam, benzonatate, lanthanum **AND** lanthanum, ondansetron **OR** ondansetron (ZOFRAN) IV, sodium chloride flush  Assessment/ Plan:  Patricia [name] is a 47 y.o.  female with a past medical history of hypertension, coronary artery disease, congestive heart failure, hyperlipidemia, and end-stage renal disease on hemodialysis. Patient presents to ED with altered mental status. She has been admitted under observation for Encephalopathy [G93.40] Altered mental status, unspecified altered mental status type [R41.82]  [facility]/MWF/Rt AVF  Hyperkalemia with end stage renal disease on hemodialysis. Received treatment yesterday due to elevated potassium. Potassium 5.2  Will dialyze today on 2K bath with UF 1.5-2L as tolerated. Next treatment scheduled for Friday  2. Anemia of chronic kidney disease  Lab Results  Component Value Date   HGB 7.5 (L) 08/26/2022    Patient receives Mircera at outpatient clinic. Will continue to monitor for now  3. Secondary Hyperparathyroidism: with outpatient labs: None available Lab Results  Component Value Date   PTH 160 (H) 04/06/2016   CALCIUM 8.8 (L) 08/26/2022   CAION 0.99 (L) 02/11/2022   PHOS 5.8 (H) 08/12/2022    Will continue to monitor bone minerals. No binders prescribed.   4. Hypertension with chronic kidney disease. Home regimen includes Amlodipine, carvedilol, hydralazine, and isosorbide.     LOS: 0   8/21/202410:38 AM

## 2022-08-26 NOTE — Discharge Planning (Signed)
 ESTBLISHED HEMODIALYSIS Outpatient Facility  [facility]  [address]  [place] [address] [phone]  Schedule: Monday Wednesday and Friday @ 05:25am  Dimas Chyle Dialysis Coordinator II  [facility] Cell: [phone] eFax: [phone] [email or web address]

## 2022-08-26 NOTE — Progress Notes (Signed)
  Received patient in bed to unit.   Informed consent signed and in chart.    TX duration:3.5 hrs     Transported back to floor Hand-off given to patient's nurse. No distress noted  no c/o   Access used: R AVF Access issues: nmone   Total UF removed: 2.0L Medication(s) given: zofran, tylenol Post HD VS: 1052/67 Post HD weight: 106.5     Lynann Beaver  [facility]

## 2022-09-02 ENCOUNTER — Telehealth: Payer: Self-pay | Admitting: Physician Assistant

## 2022-09-02 NOTE — Telephone Encounter (Signed)
Patient called in to see if she can get an earlier appointment she have been vomiting a lot the pills will work for a few then they no longer works. She wants to know what to due.

## 2022-09-02 NOTE — Telephone Encounter (Signed)
The patient call back she wanted to speak to a nurse.

## 2022-09-02 NOTE — Telephone Encounter (Signed)
This pt is established and has already seen [facility]... She saw [facility] 01/2022 they have performed procedures and imaging studies... She will need to continue her care there

## 2022-09-02 NOTE — Telephone Encounter (Signed)
Pt is aware that she will need to reach out to [facility] as she is already established... Pt stated that she thought we were the same office... Pt understood that her appt with [facility] will be cancelled.... Pt expressed understanding

## 2022-09-05 ENCOUNTER — Telehealth: Payer: Medicare Other | Admitting: Nurse Practitioner

## 2022-09-05 DIAGNOSIS — I10 Essential (primary) hypertension: Secondary | ICD-10-CM

## 2022-09-05 NOTE — Progress Notes (Signed)
Virtual Visit Consent   Patricia [name], you are scheduled for a virtual visit with a [facility] provider today. Just as with appointments in the office, your consent must be obtained to participate. Your consent will be active for this visit and any virtual visit you may have with one of our providers in the next 365 days. If you have a MyChart account, a copy of this consent can be sent to you electronically.  As this is a virtual visit, video technology does not allow for your provider to perform a traditional examination. This may limit your provider's ability to fully assess your condition. If your provider identifies any concerns that need to be evaluated in person or the need to arrange testing (such as labs, EKG, etc.), we will make arrangements to do so. Although advances in technology are sophisticated, we cannot ensure that it will always work on either your end or our end. If the connection with a video visit is poor, the visit may have to be switched to a telephone visit. With either a video or telephone visit, we are not always able to ensure that we have a secure connection.  By engaging in this virtual visit, you consent to the provision of healthcare and authorize for your insurance to be billed (if applicable) for the services provided during this visit. Depending on your insurance coverage, you may receive a charge related to this service.  I need to obtain your verbal consent now. Are you willing to proceed with your visit today? Patricia [name] has provided verbal consent on 09/05/2022 for a virtual visit (video or telephone). Patricia Rigg, NP  Date: 09/05/2022 6:45 PM  Virtual Visit via Video Note   I, Patricia [name], connected with  Patricia [name]  ([id number], July 28, 1975) on 09/05/22 at  6:00 PM EDT by a video-enabled telemedicine application and verified that I am speaking with the correct person using two identifiers.  Location: Patient:  Virtual Visit Location Patient: Home Provider: Virtual Visit Location Provider: Home Office   I discussed the limitations of evaluation and management by telemedicine and the availability of in person appointments. The patient expressed understanding and agreed to proceed.    History of Present Illness: Patricia [name] is a 47 y.o. who identifies as a female who was assigned female at birth, and is being seen today for hypertension.   [name] states her blood pressures have been elevated today. She reports the following average readings 140-180/70-90s.  Associated symptoms include: dizziness and headaches. She is taking tylenol for her headaches and the dizziness she reports is chronic.   She has not seen her primary recently and does not usually monitor her blood pressures so she is unsure how long her blood pressures have been elevated.   She does not endorse acute visual disturbance, chest pain, shortness of breath of BLE edema  Problems:  Patient Active Problem List   Diagnosis Date Noted   Obesity (BMI 30-39.9) 08/25/2022   Acute encephalopathy 08/24/2022   (HFpEF) heart failure with preserved ejection fraction (HCC) 08/24/2022   Acute respiratory distress 08/11/2022   Pneumonia due to COVID-19 virus 08/10/2022   Facial twitching 08/09/2022   CAP (community acquired pneumonia) 07/21/2022   Acute pulmonary edema (HCC) 07/20/2022   Hypervolemia associated with renal insufficiency 05/11/2022   Hyperkalemia 05/11/2022   Restless leg syndrome 04/23/2022   Anaphylactic shock, unspecified, initial encounter 09/07/2021   Gout 08/21/2021   GERD without esophagitis 08/21/2021  Anxiety and depression 08/21/2021   Neuropathic pain 05/26/2021   Dystonia 01/13/2021   Polyneuropathy associated with underlying disease (HCC) 01/13/2021   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   Other specified complication of vascular prosthetic devices, implants and grafts, initial encounter (HCC)  09/03/2020   Contact with and (suspected) exposure to covid-19 08/13/2020   Altered mental status, unspecified 06/25/2020   Other irregular eye movements 06/25/2020   PD catheter dysfunction (HCC) 04/26/2020   Other disorders of phosphorus metabolism 04/17/2020   Hypercalcemia 04/15/2020   Impacted cerumen of both ears 04/05/2020   Chronic gouty arthritis 04/05/2020   Generalized anxiety disorder 04/05/2020   History of hypertension 04/05/2020   Idiopathic peripheral neuropathy 04/05/2020   Primary insomnia 04/05/2020   Malnutrition of moderate degree 03/13/2020   Hypertensive urgency 03/11/2020   Myoclonia 03/11/2020   Abscess of vulva 02/19/2020   Pericolonic abscess 02/06/2020   Chest pain 05/10/2019   Anemia 04/25/2019   SVT (supraventricular tachycardia) 04/25/2019   Elevated troponin 04/25/2019   ESRD (end stage renal disease) (HCC) 04/25/2019   Bacterial infection, unspecified 04/17/2019   Pain, unspecified 04/17/2019   Pruritus, unspecified 04/17/2019   Benign paroxysmal positional vertigo of left ear 08/17/2018   Acute on chronic blood loss anemia 05/21/2018   GI bleed 05/20/2018   Symptomatic anemia 10/28/2017   Acidosis 05/24/2017   Chills (without fever) 05/24/2017   Coagulation defect, unspecified (HCC) 05/24/2017   Dependence on renal dialysis (HCC) 05/24/2017   Encounter for fitting and adjustment of extracorporeal dialysis catheter (HCC) 05/24/2017   Fluid overload, unspecified 05/24/2017   Iron deficiency anemia, unspecified 05/24/2017   Liver disease, unspecified 05/24/2017   Other disorders of electrolyte and fluid balance, not elsewhere classified 05/24/2017   Other disorders resulting from impaired renal tubular function 05/24/2017   Other long term (current) drug therapy 05/24/2017   Secondary hyperparathyroidism of renal origin (HCC) 05/24/2017   Unspecified abnormal findings in urine 05/24/2017   Unspecified jaundice 05/24/2017   Chronic kidney  disease 05/24/2017   Type 2 diabetes mellitus with other diabetic kidney complication (HCC) 05/24/2017   ESRD on peritoneal dialysis (HCC) 04/01/2016   Anemia due to chronic kidney disease 04/01/2016   GERD (gastroesophageal reflux disease) 04/01/2016   Atypical chest pain    Nephrotic syndrome 10/02/2013   Dyslipidemia 10/02/2013   PAT (paroxysmal atrial tachycardia) 09/29/2013   Normocytic anemia 06/29/2013   Glomerulonephritis 01/28/2013   Dyspnea 07/04/2011   Morbid obesity (HCC) 07/04/2011   Hypertension 07/04/2011   Hypercholesterolemia 07/04/2011    Allergies:  Allergies  Allergen Reactions   Nsaids Other (See Comments)    Cannot take due to Kidney disease/Kidney function  Other Reaction(s): Unknown   Tolmetin Other (See Comments)    Cannot take due to Kidney Disease   Medications:  Current Outpatient Medications:    acetaminophen (TYLENOL) 500 MG tablet, Take 1,000 mg by mouth every 6 (six) hours as needed for moderate pain., Disp: , Rfl:    allopurinol (ZYLOPRIM) 100 MG tablet, Take 100 mg by mouth at bedtime. , Disp: , Rfl:    ALPRAZolam (XANAX) 1 MG tablet, Take 1 mg by mouth 3 (three) times daily as needed for anxiety or sleep., Disp: , Rfl:    amLODipine (NORVASC) 10 MG tablet, Take 1 tablet (10 mg total) by mouth daily., Disp: 30 tablet, Rfl: 1   ascorbic acid (VITAMIN C) 500 MG tablet, Take 1 tablet (500 mg total) by mouth 2 (two) times daily., Disp: 60 tablet, Rfl: 0  benzonatate (TESSALON) 200 MG capsule, Take 1 capsule (200 mg total) by mouth 3 (three) times daily as needed for cough., Disp: 30 capsule, Rfl: 0   carbidopa-levodopa (SINEMET IR) 25-100 MG tablet, TAKE 1 TABLET BY MOUTH THREE TIMES DAILY, Disp: 90 tablet, Rfl: 0   carvedilol (COREG) 25 MG tablet, Take 1 tablet (25 mg total) by mouth 2 (two) times daily with a meal., Disp: 60 tablet, Rfl: 1   chlorpheniramine-HYDROcodone (TUSSIONEX) 10-8 MG/5ML, Take 5 mLs by mouth every 12 (twelve) hours as needed  for cough., Disp: 120 mL, Rfl: 0   Diclofenac Sodium 1 % CREA, 1 gram qid prn, Disp: 120 g, Rfl: 11   DULoxetine (CYMBALTA) 30 MG capsule, Take 1 capsule (30 mg total) by mouth daily., Disp: 30 capsule, Rfl: 3   gabapentin (NEURONTIN) 100 MG capsule, TAKE 3 CAPSULES(300 MG) BY MOUTH THREE TIMES DAILY, Disp: 270 capsule, Rfl: 0   hydrALAZINE (APRESOLINE) 25 MG tablet, Take 3 tablets (75 mg total) by mouth every 8 (eight) hours., Disp: 270 tablet, Rfl: 2   hydrocortisone (ANUSOL-HC) 2.5 % rectal cream, Place 1 Application rectally 2 (two) times daily. (Patient not taking: Reported on 08/09/2022), Disp: 30 g, Rfl: 0   hydrOXYzine (ATARAX/VISTARIL) 25 MG tablet, Take 25 mg by mouth every 6 (six) hours as needed for itching., Disp: , Rfl:    isosorbide mononitrate (IMDUR) 60 MG 24 hr tablet, Take 1 tablet (60 mg total) by mouth every evening. Skip the dose if systolic BP less than 140 mmHg, Disp: 30 tablet, Rfl: 11   lanthanum (FOSRENOL) 1000 MG chewable tablet, Chew 1,000-2,000 mg by mouth See admin instructions. Take 2 tablets (2000mg ) by mouth three times daily with meals and take 1 tablet (1000mg ) by mouth twice daily with snacks, Disp: , Rfl:    lidocaine-prilocaine (EMLA) cream, 1gram tid prn, Disp: 30 g, Rfl: 11   multivitamin (RENA-VIT) TABS tablet, Take 1 tablet by mouth at bedtime. , Disp: , Rfl:    omeprazole (PRILOSEC) 40 MG capsule, Take 1 capsule (40 mg total) by mouth daily., Disp: 30 capsule, Rfl: 1   ondansetron (ZOFRAN) 4 MG tablet, Take 4 mg by mouth 2 (two) times daily as needed for nausea or vomiting., Disp: , Rfl:    polyethylene glycol (MIRALAX) 17 g packet, Take 17 g by mouth daily. (Patient taking differently: Take 17 g by mouth daily as needed for mild constipation or moderate constipation.), Disp: 14 each, Rfl: 0   pregabalin (LYRICA) 75 MG capsule, Take 75 mg by mouth at bedtime., Disp: , Rfl:    promethazine (PHENERGAN) 25 MG tablet, Take 25 mg by mouth 3 (three) times daily as  needed for nausea or vomiting., Disp: , Rfl:    rOPINIRole (REQUIP) 1 MG tablet, Take 1 tablet (1 mg total) by mouth at bedtime., Disp: 30 tablet, Rfl: 6   vortioxetine HBr (TRINTELLIX) 10 MG TABS tablet, Take 10 mg by mouth daily., Disp: , Rfl:    zinc sulfate 220 (50 Zn) MG capsule, Take 1 capsule (220 mg total) by mouth daily., Disp: 30 capsule, Rfl: 0   zolpidem (AMBIEN) 5 MG tablet, Take 5 mg by mouth at bedtime., Disp: , Rfl:   Observations/Objective: Patient is well-developed, well-nourished in no acute distress.  Resting comfortably  at home.  Head is normocephalic, atraumatic.  No labored breathing.  Speech is clear and coherent with logical content.  Patient is alert and oriented at baseline.    Assessment and Plan: 1. Primary  hypertension Take evening dose of carvedilol and imdur If no improvement go to urgent care Follow up with PCP for office visit, sleep study, cardiology referral  Follow Up Instructions: I discussed the assessment and treatment plan with the patient. The patient was provided an opportunity to ask questions and all were answered. The patient agreed with the plan and demonstrated an understanding of the instructions.  A copy of instructions were sent to the patient via MyChart unless otherwise noted below.     The patient was advised to call back or seek an in-person evaluation if the symptoms worsen or if the condition fails to improve as anticipated.  Time:  I spent 15 minutes with the patient via telehealth technology discussing the above problems/concerns.    Patricia Rigg, NP

## 2022-09-05 NOTE — Patient Instructions (Signed)
Patricia [name], thank you for joining Claiborne Rigg, NP for today's virtual visit.  While this provider is not your primary care provider (PCP), if your PCP is located in our provider database this encounter information will be shared with them immediately following your visit.   A [facility] MyChart account gives you access to today's visit and all your visits, tests, and labs performed at [facility] " click here if you don't have a [facility] MyChart account or go to [email or web address]  Consent: (Patient) Patricia [name] provided verbal consent for this virtual visit at the beginning of the encounter.  Current Medications:  Current Outpatient Medications:    acetaminophen (TYLENOL) 500 MG tablet, Take 1,000 mg by mouth every 6 (six) hours as needed for moderate pain., Disp: , Rfl:    allopurinol (ZYLOPRIM) 100 MG tablet, Take 100 mg by mouth at bedtime. , Disp: , Rfl:    ALPRAZolam (XANAX) 1 MG tablet, Take 1 mg by mouth 3 (three) times daily as needed for anxiety or sleep., Disp: , Rfl:    amLODipine (NORVASC) 10 MG tablet, Take 1 tablet (10 mg total) by mouth daily., Disp: 30 tablet, Rfl: 1   ascorbic acid (VITAMIN C) 500 MG tablet, Take 1 tablet (500 mg total) by mouth 2 (two) times daily., Disp: 60 tablet, Rfl: 0   benzonatate (TESSALON) 200 MG capsule, Take 1 capsule (200 mg total) by mouth 3 (three) times daily as needed for cough., Disp: 30 capsule, Rfl: 0   carbidopa-levodopa (SINEMET IR) 25-100 MG tablet, TAKE 1 TABLET BY MOUTH THREE TIMES DAILY, Disp: 90 tablet, Rfl: 0   carvedilol (COREG) 25 MG tablet, Take 1 tablet (25 mg total) by mouth 2 (two) times daily with a meal., Disp: 60 tablet, Rfl: 1   chlorpheniramine-HYDROcodone (TUSSIONEX) 10-8 MG/5ML, Take 5 mLs by mouth every 12 (twelve) hours as needed for cough., Disp: 120 mL, Rfl: 0   Diclofenac Sodium 1 % CREA, 1 gram qid prn, Disp: 120 g, Rfl: 11   DULoxetine (CYMBALTA) 30 MG  capsule, Take 1 capsule (30 mg total) by mouth daily., Disp: 30 capsule, Rfl: 3   gabapentin (NEURONTIN) 100 MG capsule, TAKE 3 CAPSULES(300 MG) BY MOUTH THREE TIMES DAILY, Disp: 270 capsule, Rfl: 0   hydrALAZINE (APRESOLINE) 25 MG tablet, Take 3 tablets (75 mg total) by mouth every 8 (eight) hours., Disp: 270 tablet, Rfl: 2   hydrocortisone (ANUSOL-HC) 2.5 % rectal cream, Place 1 Application rectally 2 (two) times daily. (Patient not taking: Reported on 08/09/2022), Disp: 30 g, Rfl: 0   hydrOXYzine (ATARAX/VISTARIL) 25 MG tablet, Take 25 mg by mouth every 6 (six) hours as needed for itching., Disp: , Rfl:    isosorbide mononitrate (IMDUR) 60 MG 24 hr tablet, Take 1 tablet (60 mg total) by mouth every evening. Skip the dose if systolic BP less than 140 mmHg, Disp: 30 tablet, Rfl: 11   lanthanum (FOSRENOL) 1000 MG chewable tablet, Chew 1,000-2,000 mg by mouth See admin instructions. Take 2 tablets (2000mg ) by mouth three times daily with meals and take 1 tablet (1000mg ) by mouth twice daily with snacks, Disp: , Rfl:    lidocaine-prilocaine (EMLA) cream, 1gram tid prn, Disp: 30 g, Rfl: 11   multivitamin (RENA-VIT) TABS tablet, Take 1 tablet by mouth at bedtime. , Disp: , Rfl:    omeprazole (PRILOSEC) 40 MG capsule, Take 1 capsule (40 mg total) by mouth daily., Disp: 30 capsule, Rfl: 1   ondansetron (ZOFRAN) 4 MG  tablet, Take 4 mg by mouth 2 (two) times daily as needed for nausea or vomiting., Disp: , Rfl:    polyethylene glycol (MIRALAX) 17 g packet, Take 17 g by mouth daily. (Patient taking differently: Take 17 g by mouth daily as needed for mild constipation or moderate constipation.), Disp: 14 each, Rfl: 0   pregabalin (LYRICA) 75 MG capsule, Take 75 mg by mouth at bedtime., Disp: , Rfl:    promethazine (PHENERGAN) 25 MG tablet, Take 25 mg by mouth 3 (three) times daily as needed for nausea or vomiting., Disp: , Rfl:    rOPINIRole (REQUIP) 1 MG tablet, Take 1 tablet (1 mg total) by mouth at bedtime.,  Disp: 30 tablet, Rfl: 6   vortioxetine HBr (TRINTELLIX) 10 MG TABS tablet, Take 10 mg by mouth daily., Disp: , Rfl:    zinc sulfate 220 (50 Zn) MG capsule, Take 1 capsule (220 mg total) by mouth daily., Disp: 30 capsule, Rfl: 0   zolpidem (AMBIEN) 5 MG tablet, Take 5 mg by mouth at bedtime., Disp: , Rfl:    Medications ordered in this encounter:  No orders of the defined types were placed in this encounter.    *If you need refills on other medications prior to your next appointment, please contact your pharmacy*  Follow-Up: Call back or seek an in-person evaluation if the symptoms worsen or if the condition fails to improve as anticipated.  [facility] [phone]  Other Instructions Take evening dose of carvedilol and imdur If no improvement go to urgent care Follow up with PCP for office visit, sleep study, cardiology referral   If you have been instructed to have an in-person evaluation today at a local Urgent Care facility, please use the link below. It will take you to a list of all of our available [facility], including address, phone number and hours of operation. Please do not delay care.  [facility]  If you or a family member do not have a primary care provider, use the link below to schedule a visit and establish care. When you choose a [facility] primary care physician or advanced practice provider, you gain a long-term partner in health. Find a Primary Care Provider  Learn more about [facility] in-office and virtual care options: [facility] - Get Care Now

## 2022-09-10 ENCOUNTER — Other Ambulatory Visit: Payer: Self-pay | Admitting: Neurology

## 2022-09-11 ENCOUNTER — Other Ambulatory Visit: Payer: Self-pay | Admitting: Nurse Practitioner

## 2022-09-11 DIAGNOSIS — R112 Nausea with vomiting, unspecified: Secondary | ICD-10-CM

## 2022-09-15 ENCOUNTER — Encounter: Payer: Self-pay | Admitting: Podiatry

## 2022-09-15 ENCOUNTER — Ambulatory Visit (INDEPENDENT_AMBULATORY_CARE_PROVIDER_SITE_OTHER): Payer: Medicare Other | Admitting: Podiatry

## 2022-09-15 DIAGNOSIS — L97522 Non-pressure chronic ulcer of other part of left foot with fat layer exposed: Secondary | ICD-10-CM | POA: Diagnosis not present

## 2022-09-15 MED ORDER — GENTAMICIN SULFATE 0.1 % EX CREA: 1.0000 | TOPICAL_CREAM | Freq: Two times a day (BID) | CUTANEOUS | 1 refills | Status: DC

## 2022-09-15 NOTE — Progress Notes (Signed)
Chief Complaint  Patient presents with   Wound Check    "This left one is not doing good at all.  I'm kind of scared.  It had some drainage and an odor to it."    HPI: 47 y.o. female PMHx ESRD on hemodialysis presenting today for follow-up evaluation of ulcers to the plantar aspect of the bilateral feet.  Patient last seen in the office 05/18/2022.  Patient has been somewhat nervous because she had to cancel or reschedule her last few appointments due to COVID and she has noticed some drainage coming from the callus lesions to the left foot.  Past Medical History:  Diagnosis Date   Acid reflux    Anemia 04/2019   REQUIRING BLOOD TRANSFUSIONS   Anxiety    Arthritis    CHF (congestive heart failure) (HCC)    Dyspnea    with exertion   Dysrhythmia    tachycardia   ESRD (end stage renal disease) (HCC)    [facility]   Facial twitching 11/2020   mouth twitch, eyes roll back, neurology started patient on low dose   Sinemet IR.   GI bleeding 12/2017   Gout    Headache(784.0)    "related to chemo; sometimes weekly" (09/12/2013)   Heart murmur    Systolic per Dr Orpah Cobb' notes   High cholesterol    History of blood transfusion "a couple"   "related to low counts"   Hypertension    Iron deficiency anemia    "get epogen shots q month" (02/20/2014)   MCGN (minimal change glomerulonephritis)    "using chemo to tx;  finished my last tx in 12/2013"   Neuropathy    Peritoneal dialysis status (HCC)    Stroke (HCC) 08/15/2017   ruled out that it was not a stroke    Past Surgical History:  Procedure Laterality Date   ABDOMINAL HYSTERECTOMY  2010   "laparoscopic"   ANKLE FRACTURE SURGERY Right 1994   AV FISTULA PLACEMENT Left 04/14/2016   Procedure: ARTERIOVENOUS (AV) FISTULA CREATION;  Surgeon: Chuck Hint, MD;  Location: [facility] OR;  Service: Vascular;  Laterality: Left;   AV FISTULA PLACEMENT Left 08/09/2017   Procedure: INSERTION OF ARTERIOVENOUS (AV) GORE-TEX GRAFT  LEFT ARM;  Surgeon: Sherren Kerns, MD;  [facility];  Service: Vascular;  Laterality: Left;   AV FISTULA PLACEMENT Right 03/15/2020   Procedure: RIGHT ARM ARTERIOVENOUS (AV) FISTULA CREATION 1st Stage Basilic Transposition;  Surgeon: Nada Libman, MD;  [facility];  Service: Vascular;  Laterality: Right;   AV FISTULA PLACEMENT Right 02/24/2021   Procedure: RIGHT ARM ARTERIOVENOUS  FISTULA CREATION;  Surgeon: Victorino Sparrow, MD;  Location: [facility] OR;  Service: Vascular;  Laterality: Right;  regional block   AVGG REMOVAL Left 08/11/2017   Procedure: REMOVAL OF ARTERIOVENOUS GORETEX GRAFT (AVGG);  Surgeon: Nada Libman, MD;  Location: [facility] OR;  Service: Vascular;  Laterality: Left;   BASCILIC VEIN TRANSPOSITION Right 05/17/2020   Procedure: RIGHT UPPER EXTREMITY SECOND STAGE BASCILIC VEIN TRANSPOSITION;  Surgeon: Nada Libman, MD;  [facility];  Service: Vascular;  Laterality: Right;   BIOPSY  09/03/2017   Procedure: BIOPSY;  Surgeon: Kathi Der, MD;  [facility] ENDOSCOPY;  Service: Gastroenterology;;   BIOPSY  12/24/2017   Procedure: BIOPSY;  Surgeon: Lemar Lofty., MD;  Location: [facility] ENDOSCOPY;  Service: Gastroenterology;;   CARDIAC CATHETERIZATION  2000's   COLONOSCOPY WITH PROPOFOL N/A 09/04/2017   Procedure: COLONOSCOPY WITH PROPOFOL;  Surgeon: Kerin Salen, MD;  Location: [facility] ENDOSCOPY;  Service: Gastroenterology;  Laterality: N/A;   ENTEROSCOPY N/A 12/24/2017   Procedure: ENTEROSCOPY;  Surgeon: Meridee Score Netty Starring., MD;  Location: [facility] ENDOSCOPY;  Service: Gastroenterology;  Laterality: N/A;   ESOPHAGOGASTRODUODENOSCOPY     ESOPHAGOGASTRODUODENOSCOPY (EGD) WITH PROPOFOL Left 06/04/2017   Procedure: ESOPHAGOGASTRODUODENOSCOPY (EGD) WITH PROPOFOL;  Surgeon: Willis Modena, MD;  Location: [facility] ENDOSCOPY;  Service: Endoscopy;  Laterality: Left;   ESOPHAGOGASTRODUODENOSCOPY (EGD) WITH PROPOFOL N/A 09/03/2017   Procedure: ESOPHAGOGASTRODUODENOSCOPY (EGD) WITH  PROPOFOL;  Surgeon: Kathi Der, MD;  [facility] ENDOS[facility];  Service: Gastroenterology;  Laterality: N/A;   ESOPHAGOGASTRODUODENOSCOPY (EGD) WITH PROPOFOL N/A 04/27/2022   Procedure: ESOPHAGOGASTRODUODENOSCOPY (EGD) WITH PROPOFOL;  Surgeon: Regis Bill, MD;  Location: [facility] ENDOSCOPY;  Service: Endoscopy;  Laterality: N/A;   FISTULA SUPERFICIALIZATION Left 04/02/2017   Procedure: FISTULA SUPERFICIALIZATION LEFT ARM;  Surgeon: Nada Libman, MD;  Location: [facility] OR;  Service: Vascular;  Laterality: Left;   FISTULA SUPERFICIALIZATION Right 04/14/2021   Procedure: RIGHT ARM FISTULA SUPERFICIALIZATION;  Surgeon: Victorino Sparrow, MD;  Location: [facility] OR;  Service: Vascular;  Laterality: Right;  PERIPHERAL NERVE BLOCK   FISTULOGRAM Left 04/07/2016   Procedure: FISTULOGRAM;  Surgeon: Maeola Harman, MD;  Location: [facility] OR;  Service: Vascular;  Laterality: Left;   FRACTURE SURGERY     right ankle   GIVENS CAPSULE STUDY N/A 10/29/2017   Procedure: GIVENS CAPSULE STUDY;  Surgeon: Charlott Rakes, MD;  Location: [facility] ENDOSCOPY;  Service: Endoscopy;  Laterality: N/A;   GIVENS CAPSULE STUDY N/A 12/24/2017   Procedure: GIVENS CAPSULE STUDY;  Surgeon: Lemar Lofty., MD;  Location: [facility] ENDOSCOPY;  Service: Gastroenterology;  Laterality: N/A;   GIVENS CAPSULE STUDY N/A 05/22/2018   Procedure: GIVENS CAPSULE STUDY;  Surgeon: Lemar Lofty., MD;  Location: [facility] ENDOSCOPY;  Service: Gastroenterology;  Laterality: N/A;   INSERTION OF DIALYSIS CATHETER Right 04/07/2016   Procedure: INSERTION OF DIALYSIS CATHETER;  Surgeon: Maeola Harman, MD;  Location: [facility] OR;  Service: Vascular;  Laterality: Right;   INSERTION OF DIALYSIS CATHETER Right 04/04/2017   Procedure: INSERTION OF DIALYSIS CATHETER;  Surgeon: Maeola Harman, MD;  Location: [facility] OR;  Service: Vascular;  Laterality: Right;   IR FLUORO GUIDE CV LINE LEFT  03/13/2020   IR US GUIDE VASC ACCESS LEFT  03/13/2020   LEFT  HEART CATH AND CORONARY ANGIOGRAPHY N/A 05/12/2019   Procedure: LEFT HEART CATH AND CORONARY ANGIOGRAPHY;  Surgeon: Orpah Cobb, MD;  [facility] INVASIVE CV LAB;  Service: Cardiovascular;  Laterality: N/A;   LIGATION OF ARTERIOVENOUS  FISTULA Left 04/14/2016   Procedure: LIGATION OF ARTERIOVENOUS  FISTULA;  Surgeon: Chuck Hint, MD;  Location: [facility] OR;  Service: Vascular;  Laterality: Left;   LIGATION OF COMPETING BRANCHES OF ARTERIOVENOUS FISTULA Right 05/17/2020   Procedure: LIGATION OF COMPETING BRANCHES OF RIGHT ARM ARTERIOVENOUS FISTULA;  Surgeon: Nada Libman, MD;  [facility];  Service: Vascular;  Laterality: Right;   PATCH ANGIOPLASTY Left 06/19/2016   Procedure: PATCH ANGIOPLASTY LEFT ARTERIOVENOUS FISTULA;  Surgeon: Chuck Hint, MD;  Location: [facility] OR;  Service: Vascular;  Laterality: Left;   POLYPECTOMY  09/04/2017   Procedure: POLYPECTOMY;  Surgeon: Kerin Salen, MD;  Location: [facility] ENDOSCOPY;  Service: Gastroenterology;;   REVISON OF ARTERIOVENOUS FISTULA Left 06/19/2016   Procedure: REVISION SUPERFICIALIZATION OF BRACHIOCEPHALIC ARTERIOVENOUS FISTULA;  Surgeon: Chuck Hint, MD;  Location: [facility] OR;  Service: Vascular;  Laterality: Left;   UPPER EXTREMITY VENOGRAPHY Bilateral  02/19/2021   Procedure: UPPER EXTREMITY VENOGRAPHY;  Surgeon: Victorino Sparrow, MD;  Location: [facility] INVASIVE CV LAB;  Service: Cardiovascular;  Laterality: Bilateral;    Allergies  Allergen Reactions   Nsaids Other (See Comments)    Cannot take due to Kidney disease/Kidney function  Other Reaction(s): Unknown   Tolmetin Other (See Comments)    Cannot take due to Kidney Disease    B/L feet 04/14/2022  Physical Exam: General: The patient is alert and oriented x3 in no acute distress.  Dermatology: Skin is warm, dry and supple bilateral lower extremities.  Hyperkeratotic callus lesions noted to the same area since last visit.  Please see above noted photo.  With debridement there is  a superficial ulcer extending into the subcutaneous tissue to the left foot.  The ulcer measures approximately 0.5 x 0.5 x 0.1 cm.  There is no malodor.  Granular wound base.  No exposed bone.  Clinically no concern for infection.  Vascular: Palpable pedal pulses bilaterally. Capillary refill within normal limits.  Negative for any significant edema or erythema  Neurological: Light touch and protective threshold diminished  Musculoskeletal Exam: No pedal deformities noted.  No prior amputations   Assessment: 1.  Ulcer left forefoot with fat layer exposed 2.  Preulcerative callus right plantar fifth MTP 3.  ESRD on dialysis.  Not diabetic   Plan of Care:  -Patient evaluated.  Comprehensive foot exam performed today - Excisional debridement of the hyperkeratotic callus tissue was performed today using a 312 scalpel without incident or bleeding. - Patient states that she has not been going barefoot.  She wears custom molded accommodative diabetic insoles.  Dispensed 05/18/2022 -Medically necessary excisional debridement including subcutaneous tissue was performed today using a 312 scalpel and tissue nipper.  Excisional debridement of the necrotic healthier bleeding viable tissue was performed with postdebridement measurement same as pre- -Prescription for gentamicin cream.  Apply twice daily with a light dressing -Return to clinic 4 weeks   Felecia Shelling, DPM [facility]  Dr. Felecia Shelling, DPM    [address] [place], [place] [address]                Office [phone]  Fax [phone]

## 2022-09-25 ENCOUNTER — Other Ambulatory Visit: Payer: Self-pay | Admitting: Obstetrics and Gynecology

## 2022-10-02 ENCOUNTER — Other Ambulatory Visit: Payer: Self-pay | Admitting: Physician Assistant

## 2022-10-02 DIAGNOSIS — Z1231 Encounter for screening mammogram for malignant neoplasm of breast: Secondary | ICD-10-CM

## 2022-10-06 ENCOUNTER — Ambulatory Visit
Admission: RE | Admit: 2022-10-06 | Discharge: 2022-10-06 | Disposition: A | Discharge status: Home / Self Care | Payer: Medicare Other | Source: Ambulatory Visit

## 2022-10-06 ENCOUNTER — Other Ambulatory Visit: Payer: Self-pay | Admitting: Neurology

## 2022-10-06 DIAGNOSIS — Z1231 Encounter for screening mammogram for malignant neoplasm of breast: Secondary | ICD-10-CM

## 2022-10-08 ENCOUNTER — Other Ambulatory Visit: Payer: Self-pay | Admitting: Neurology

## 2022-10-08 ENCOUNTER — Ambulatory Visit: Payer: Medicare Other | Admitting: Physician Assistant

## 2022-10-08 NOTE — Telephone Encounter (Signed)
SHOULD WE DC LYRICA?

## 2022-10-13 ENCOUNTER — Encounter: Payer: Self-pay | Admitting: Podiatry

## 2022-10-13 ENCOUNTER — Ambulatory Visit (INDEPENDENT_AMBULATORY_CARE_PROVIDER_SITE_OTHER): Payer: 59 | Admitting: Podiatry

## 2022-10-13 VITALS — BP 151/81 | HR 83

## 2022-10-13 DIAGNOSIS — L97522 Non-pressure chronic ulcer of other part of left foot with fat layer exposed: Secondary | ICD-10-CM

## 2022-10-13 NOTE — Progress Notes (Signed)
No chief complaint on file.   HPI: 47 y.o. female PMHx ESRD on hemodialysis presenting today for follow-up evaluation of ulcers to the plantar aspect of the bilateral feet.  Patient has neuropathy and cannot feel her feet.  She is doing very well.  No new complaints  Past Medical History:  Diagnosis Date   Acid reflux    Anemia 04/2019   REQUIRING BLOOD TRANSFUSIONS   Anxiety    Arthritis    CHF (congestive heart failure) (HCC)    Dyspnea    with exertion   Dysrhythmia    tachycardia   ESRD (end stage renal disease) (HCC)    [facility]   Facial twitching 11/2020   mouth twitch, eyes roll back, neurology started patient on low dose   Sinemet IR.   GI bleeding 12/2017   Gout    Headache(784.0)    "related to chemo; sometimes weekly" (09/12/2013)   Heart murmur    Systolic per Dr Orpah Cobb' notes   High cholesterol    History of blood transfusion "a couple"   "related to low counts"   Hypertension    Iron deficiency anemia    "get epogen shots q month" (02/20/2014)   MCGN (minimal change glomerulonephritis)    "using chemo to tx;  finished my last tx in 12/2013"   Neuropathy    Peritoneal dialysis status (HCC)    Stroke (HCC) 08/15/2017   ruled out that it was not a stroke    Past Surgical History:  Procedure Laterality Date   ABDOMINAL HYSTERECTOMY  2010   "laparoscopic"   ANKLE FRACTURE SURGERY Right 1994   AV FISTULA PLACEMENT Left 04/14/2016   Procedure: ARTERIOVENOUS (AV) FISTULA CREATION;  Surgeon: Chuck Hint, MD;  Location: [facility] OR;  Service: Vascular;  Laterality: Left;   AV FISTULA PLACEMENT Left 08/09/2017   Procedure: INSERTION OF ARTERIOVENOUS (AV) GORE-TEX GRAFT LEFT ARM;  Surgeon: Sherren Kerns, MD;  [facility] OR;  Service: Vascular;  Laterality: Left;   AV FISTULA PLACEMENT Right 03/15/2020   Procedure: RIGHT ARM ARTERIOVENOUS (AV) FISTULA CREATION 1st Stage Basilic Transposition;  Surgeon: Nada Libman, MD;  [facility] OR;   Service: Vascular;  Laterality: Right;   AV FISTULA PLACEMENT Right 02/24/2021   Procedure: RIGHT ARM ARTERIOVENOUS  FISTULA CREATION;  Surgeon: Victorino Sparrow, MD;  Location: [facility] OR;  Service: Vascular;  Laterality: Right;  regional block   AVGG REMOVAL Left 08/11/2017   Procedure: REMOVAL OF ARTERIOVENOUS GORETEX GRAFT (AVGG);  Surgeon: Nada Libman, MD;  Location: [facility] OR;  Service: Vascular;  Laterality: Left;   BASCILIC VEIN TRANSPOSITION Right 05/17/2020   Procedure: RIGHT UPPER EXTREMITY SECOND STAGE BASCILIC VEIN TRANSPOSITION;  Surgeon: Nada Libman, MD;  [facility] OR;  Service: Vascular;  Laterality: Right;   BIOPSY  09/03/2017   Procedure: BIOPSY;  Surgeon: Kathi Der, MD;  [facility] ENDOSCOPY;  Service: Gastroenterology;;   BIOPSY  12/24/2017   Procedure: BIOPSY;  Surgeon: Lemar Lofty., MD;  Location: [facility] ENDOSCOPY;  Service: Gastroenterology;;   CARDIAC CATHETERIZATION  2000's   COLONOSCOPY WITH PROPOFOL N/A 09/04/2017   Procedure: COLONOSCOPY WITH PROPOFOL;  Surgeon: Kerin Salen, MD;  Location: [facility] ENDOSCOPY;  Service: Gastroenterology;  Laterality: N/A;   ENTEROSCOPY N/A 12/24/2017   Procedure: ENTEROSCOPY;  Surgeon: Meridee Score Netty Starring., MD;  Location: [facility] ENDOSCOPY;  Service: Gastroenterology;  Laterality: N/A;   ESOPHAGOGASTRODUODENOSCOPY     ESOPHAGOGASTRODUODENOSCOPY (EGD) WITH PROPOFOL Left 06/04/2017   Procedure: ESOPHAGOGASTRODUODENOSCOPY (  EGD) WITH PROPOFOL;  Surgeon: Willis Modena, MD;  Location: [facility] ENDOSCOPY;  Service: Endoscopy;  Laterality: Left;   ESOPHAGOGASTRODUODENOSCOPY (EGD) WITH PROPOFOL N/A 09/03/2017   Procedure: ESOPHAGOGASTRODUODENOSCOPY (EGD) WITH PROPOFOL;  Surgeon: Kathi Der, MD;  [facility] ENDOSCOPY;  Service: Gastroenterology;  Laterality: N/A;   ESOPHAGOGASTRODUODENOSCOPY (EGD) WITH PROPOFOL N/A 04/27/2022   Procedure: ESOPHAGOGASTRODUODENOSCOPY (EGD) WITH PROPOFOL;  Surgeon: Regis Bill, MD;  Location: [facility]  ENDOSCOPY;  Service: Endoscopy;  Laterality: N/A;   FISTULA SUPERFICIALIZATION Left 04/02/2017   Procedure: FISTULA SUPERFICIALIZATION LEFT ARM;  Surgeon: Nada Libman, MD;  Location: [facility] OR;  Service: Vascular;  Laterality: Left;   FISTULA SUPERFICIALIZATION Right 04/14/2021   Procedure: RIGHT ARM FISTULA SUPERFICIALIZATION;  Surgeon: Victorino Sparrow, MD;  Location: [facility] OR;  Service: Vascular;  Laterality: Right;  PERIPHERAL NERVE BLOCK   FISTULOGRAM Left 04/07/2016   Procedure: FISTULOGRAM;  Surgeon: Maeola Harman, MD;  Location: [facility] OR;  Service: Vascular;  Laterality: Left;   FRACTURE SURGERY     right ankle   GIVENS CAPSULE STUDY N/A 10/29/2017   Procedure: GIVENS CAPSULE STUDY;  Surgeon: Charlott Rakes, MD;  Location: [facility] ENDOSCOPY;  Service: Endoscopy;  Laterality: N/A;   GIVENS CAPSULE STUDY N/A 12/24/2017   Procedure: GIVENS CAPSULE STUDY;  Surgeon: Lemar Lofty., MD;  Location: [facility] ENDOSCOPY;  Service: Gastroenterology;  Laterality: N/A;   GIVENS CAPSULE STUDY N/A 05/22/2018   Procedure: GIVENS CAPSULE STUDY;  Surgeon: Lemar Lofty., MD;  Location: [facility] ENDOSCOPY;  Service: Gastroenterology;  Laterality: N/A;   INSERTION OF DIALYSIS CATHETER Right 04/07/2016   Procedure: INSERTION OF DIALYSIS CATHETER;  Surgeon: Maeola Harman, MD;  Location: [facility] OR;  Service: Vascular;  Laterality: Right;   INSERTION OF DIALYSIS CATHETER Right 04/04/2017   Procedure: INSERTION OF DIALYSIS CATHETER;  Surgeon: Maeola Harman, MD;  Location: [facility] OR;  Service: Vascular;  Laterality: Right;   IR FLUORO GUIDE CV LINE LEFT  03/13/2020   IR US GUIDE VASC ACCESS LEFT  03/13/2020   LEFT HEART CATH AND CORONARY ANGIOGRAPHY N/A 05/12/2019   Procedure: LEFT HEART CATH AND CORONARY ANGIOGRAPHY;  Surgeon: Orpah Cobb, MD;  [facility] INVASIVE CV LAB;  Service: Cardiovascular;  Laterality: N/A;   LIGATION OF ARTERIOVENOUS  FISTULA Left 04/14/2016   Procedure: LIGATION OF  ARTERIOVENOUS  FISTULA;  Surgeon: Chuck Hint, MD;  Location: [facility] OR;  Service: Vascular;  Laterality: Left;   LIGATION OF COMPETING BRANCHES OF ARTERIOVENOUS FISTULA Right 05/17/2020   Procedure: LIGATION OF COMPETING BRANCHES OF RIGHT ARM ARTERIOVENOUS FISTULA;  Surgeon: Nada Libman, MD;  [facility] OR;  Service: Vascular;  Laterality: Right;   PATCH ANGIOPLASTY Left 06/19/2016   Procedure: PATCH ANGIOPLASTY LEFT ARTERIOVENOUS FISTULA;  Surgeon: Chuck Hint, MD;  Location: [facility] OR;  Service: Vascular;  Laterality: Left;   POLYPECTOMY  09/04/2017   Procedure: POLYPECTOMY;  Surgeon: Kerin Salen, MD;  Location: [facility] ENDOSCOPY;  Service: Gastroenterology;;   REVISON OF ARTERIOVENOUS FISTULA Left 06/19/2016   Procedure: REVISION SUPERFICIALIZATION OF BRACHIOCEPHALIC ARTERIOVENOUS FISTULA;  Surgeon: Chuck Hint, MD;  Location: [facility] OR;  Service: Vascular;  Laterality: Left;   UPPER EXTREMITY VENOGRAPHY Bilateral 02/19/2021   Procedure: UPPER EXTREMITY VENOGRAPHY;  Surgeon: Victorino Sparrow, MD;  Location: [facility] INVASIVE CV LAB;  Service: Cardiovascular;  Laterality: Bilateral;    Allergies  Allergen Reactions   Nsaids Other (See Comments)    Cannot take due to Kidney disease/Kidney function  Other Reaction(s): Unknown   Tolmetin Other (  See Comments)    Cannot take due to Kidney Disease    B/L feet 04/14/2022  Physical Exam: General: The patient is alert and oriented x3 in no acute distress.  Dermatology: Callus lesions noted to the weightbearing surfaces of the bilateral feet.  No open wounds.  Vascular: Palpable pedal pulses bilaterally. Capillary refill within normal limits.  Negative for any significant edema or erythema  Neurological: Light touch and protective threshold absent  Musculoskeletal Exam: No pedal deformities noted.  No prior amputations   Assessment: 1.  Ulcer left forefoot with fat layer exposed 2.  Preulcerative callus right plantar fifth  MTP 3.  ESRD on dialysis.  Not diabetic   Plan of Care:  -Patient evaluated.  -Continue custom molded accommodative insoles -Patient wears house slippers around the house.  I would recommend a more supportive house shoe to equally distribute pressure throughout the foot and alleviate the callus formations -Return to clinic 3 months  Felecia Shelling, DPM [facility]  Dr. Felecia Shelling, DPM    [address] [place], [place] [address]                Office [phone]  Fax [phone]

## 2022-10-14 ENCOUNTER — Other Ambulatory Visit: Payer: Self-pay

## 2022-10-14 ENCOUNTER — Emergency Department
Admission: EM | Admit: 2022-10-14 | Discharge: 2022-10-14 | Disposition: A | Discharge status: Home / Self Care | Payer: 59 | Attending: Emergency Medicine | Admitting: Emergency Medicine

## 2022-10-14 DIAGNOSIS — I120 Hypertensive chronic kidney disease with stage 5 chronic kidney disease or end stage renal disease: Secondary | ICD-10-CM | POA: Diagnosis not present

## 2022-10-14 DIAGNOSIS — N186 End stage renal disease: Secondary | ICD-10-CM | POA: Diagnosis not present

## 2022-10-14 DIAGNOSIS — K649 Unspecified hemorrhoids: Secondary | ICD-10-CM

## 2022-10-14 DIAGNOSIS — D631 Anemia in chronic kidney disease: Secondary | ICD-10-CM | POA: Insufficient documentation

## 2022-10-14 DIAGNOSIS — K644 Residual hemorrhoidal skin tags: Secondary | ICD-10-CM | POA: Diagnosis not present

## 2022-10-14 DIAGNOSIS — Z992 Dependence on renal dialysis: Secondary | ICD-10-CM | POA: Diagnosis not present

## 2022-10-14 LAB — CBC WITH DIFFERENTIAL/PLATELET
Abs Immature Granulocytes: 0.04 10*3/uL (ref 0.00–0.07)
Basophils Absolute: 0 10*3/uL (ref 0.0–0.1)
Basophils Relative: 1 %
Eosinophils Absolute: 0.2 10*3/uL (ref 0.0–0.5)
Eosinophils Relative: 2 %
HCT: 31 % — ABNORMAL LOW (ref 36.0–46.0)
Hemoglobin: 9 g/dL — ABNORMAL LOW (ref 12.0–15.0)
Immature Granulocytes: 1 %
Lymphocytes Relative: 16 %
Lymphs Abs: 1.1 10*3/uL (ref 0.7–4.0)
MCH: 24.4 pg — ABNORMAL LOW (ref 26.0–34.0)
MCHC: 29 g/dL — ABNORMAL LOW (ref 30.0–36.0)
MCV: 84 fL (ref 80.0–100.0)
Monocytes Absolute: 0.7 10*3/uL (ref 0.1–1.0)
Monocytes Relative: 10 %
Neutro Abs: 5.2 10*3/uL (ref 1.7–7.7)
Neutrophils Relative %: 70 %
Platelets: 353 10*3/uL (ref 150–400)
RBC: 3.69 MIL/uL — ABNORMAL LOW (ref 3.87–5.11)
RDW: 19.5 % — ABNORMAL HIGH (ref 11.5–15.5)
WBC: 7.5 10*3/uL (ref 4.0–10.5)
nRBC: 1.6 % — ABNORMAL HIGH (ref 0.0–0.2)

## 2022-10-14 LAB — BASIC METABOLIC PANEL
Anion gap: 16 — ABNORMAL HIGH (ref 5–15)
BUN: 20 mg/dL (ref 6–20)
CO2: 26 mmol/L (ref 22–32)
Calcium: 8.4 mg/dL — ABNORMAL LOW (ref 8.9–10.3)
Chloride: 92 mmol/L — ABNORMAL LOW (ref 98–111)
Creatinine, Ser: 4.45 mg/dL — ABNORMAL HIGH (ref 0.44–1.00)
GFR, Estimated: 12 mL/min — ABNORMAL LOW (ref >60)
Glucose, Bld: 86 mg/dL (ref 70–99)
Potassium: 3.8 mmol/L (ref 3.5–5.1)
Sodium: 134 mmol/L — ABNORMAL LOW (ref 135–145)

## 2022-10-14 NOTE — ED Provider Notes (Signed)
[facility] Provider Note    Event Date/Time   First MD Initiated Contact with Patient 10/14/22 1217     (approximate)   History   Melena   HPI Patricia [name] is a 47 y.o. female with ESRD on dialysis, anemia due to chronic disease, HTN, HLD, prior history of internal hemorrhoids presenting today for bright red blood per rectum.  Patient states she had some lower abdominal pain earlier this morning and then had a bowel movement where she noticed bright red blood in the toilet.  She has not had any similar instances recently with bright red blood and denies any repeat symptoms since.  She denies any melanotic stools.  Does note prior history of internal hemorrhoids which look similar in the past.  No other history of GI bleeding.  Not on any blood thinners.  Otherwise, asymptomatic at this time.  Prior chart review performed with evidence of hemorrhoids documented in the past.     Physical Exam   Triage Vital Signs: ED Triage Vitals [10/14/22 1130]  Encounter Vitals Group     BP (!) 135/94     Systolic BP Percentile      Diastolic BP Percentile      Pulse Rate 96     Resp 19     Temp 98.7 F (37.1 C)     Temp Source Oral     SpO2 100 %     Weight 238 lb 5.1 oz (108.1 kg)     Height 5\' 7"  (1.702 m)     Head Circumference      Peak Flow      Pain Score 0     Pain Loc      Pain Education      Exclude from Growth Chart     Most recent vital signs: Vitals:   10/14/22 1130  BP: (!) 135/94  Pulse: 96  Resp: 19  Temp: 98.7 F (37.1 C)  SpO2: 100%   Physical Exam: I have reviewed the vital signs and nursing notes. General: Awake, alert, no acute distress.  Nontoxic appearing. Head:  Atraumatic, normocephalic.   ENT:  EOM intact, PERRL. Oral mucosa is pink and moist with no lesions. Neck: Neck is supple with full range of motion, No meningeal signs. Cardiovascular:  RRR, No murmurs. Peripheral pulses palpable and equal  bilaterally. Respiratory:  Symmetrical chest wall expansion.  No rhonchi, rales, or wheezes.  Good air movement throughout.  No use of accessory muscles.   Musculoskeletal:  No cyanosis or edema. Moving extremities with full ROM Abdomen:  Soft, nontender, nondistended. Rectal: Signs of external hemorrhoids present on exam.  No melanotic stools or bright red blood per rectum noted. Neuro:  GCS 15, moving all four extremities, interacting appropriately. Speech clear. Psych:  Calm, appropriate.   Skin:  Warm, dry, no rash.    ED Results / Procedures / Treatments   Labs (all labs ordered are listed, but only abnormal results are displayed) Labs Reviewed  CBC WITH DIFFERENTIAL/PLATELET - Abnormal; Notable for the following components:      Result Value   RBC 3.69 (*)    Hemoglobin 9.0 (*)    HCT 31.0 (*)    MCH 24.4 (*)    MCHC 29.0 (*)    RDW 19.5 (*)    nRBC 1.6 (*)    All other components within normal limits  BASIC METABOLIC PANEL - Abnormal; Notable for the following components:   Sodium 134 (*)  Chloride 92 (*)    Creatinine, Ser 4.45 (*)    Calcium 8.4 (*)    GFR, Estimated 12 (*)    Anion gap 16 (*)    All other components within normal limits     EKG    RADIOLOGY    PROCEDURES:  Critical Care performed: No  Procedures   MEDICATIONS ORDERED IN ED: Medications - No data to display   IMPRESSION / MDM / ASSESSMENT AND PLAN / ED COURSE  I reviewed the triage vital signs and the nursing notes.                              Differential diagnosis includes, but is not limited to, internal hemorrhoid, external hemorrhoid, anal fissure, less likely acute GI bleed.  Patient's presentation is most consistent with acute complicated illness / injury requiring diagnostic workup.  Patient is a 47 year old female presenting today for 1 episode of bright red blood per rectum.  Initially had some lower abdominal pain then have the bowel movement.  Abdominal pain has  resolved at this point.  No repeat episodes of bright red blood per rectum.  No recent melanotic stools.  Vital signs are stable.  Hemoglobin improved from recent baseline labs.  Rectal exam performed shows evidence of external hemorrhoids but no obvious other internal hemorrhoids and no melanotic or gross blood per rectum.  Patient safe for discharge for likely treatment for hemorrhoids at this time.  Given strict return precautions and told to follow-up with PCP.  She also has planned colonoscopy later this year.  Clinical Course as of 10/14/22 1320  Wed Oct 14, 2022  1258 Evidence of external hemorrhoid present.  Rectal exam performed with no melanotic stools or bright red blood noted. [DW]  1308 Hemoglobin(!): 9.0 Hemoglobin improved from prior baseline [DW]  1312 Basic metabolic panel(!) Dialysis today.  Blood work otherwise reassuring right now [DW]    Clinical Course User Index [DW] Janith Lima, MD     FINAL CLINICAL IMPRESSION(S) / ED DIAGNOSES   Final diagnoses:  Hemorrhoids, unspecified hemorrhoid type     Rx / DC Orders   ED Discharge Orders     None        Note:  This document was prepared using Dragon voice recognition software and may include unintentional dictation errors.   Janith Lima, MD 10/14/22 1320

## 2022-10-14 NOTE — Discharge Instructions (Signed)
Exam seems most consistent with a hemorrhoid today.  I have attached instructions for treating hemorrhoids at home.  Please increase fiber and make sure you are having daily stools.  Please follow-up with your regular doctor for ongoing management.  Please follow-up for your planned colonoscopy later this year.

## 2022-10-14 NOTE — ED Triage Notes (Signed)
Pt presents to ED with c/o of bright red rectal bleeding x1 occurrence, pt states HX of hemorrhoids. NAD noted.

## 2022-10-19 ENCOUNTER — Other Ambulatory Visit: Payer: Self-pay

## 2022-10-19 ENCOUNTER — Inpatient Hospital Stay
Admission: EM | Admit: 2022-10-19 | Discharge: 2022-10-23 | DRG: 377 | Disposition: A | Discharge status: Home / Self Care | Payer: 59 | Attending: Internal Medicine | Admitting: Internal Medicine

## 2022-10-19 ENCOUNTER — Emergency Department: Payer: 59

## 2022-10-19 DIAGNOSIS — E1142 Type 2 diabetes mellitus with diabetic polyneuropathy: Secondary | ICD-10-CM | POA: Diagnosis present

## 2022-10-19 DIAGNOSIS — K5909 Other constipation: Secondary | ICD-10-CM | POA: Diagnosis present

## 2022-10-19 DIAGNOSIS — Z90710 Acquired absence of both cervix and uterus: Secondary | ICD-10-CM

## 2022-10-19 DIAGNOSIS — D631 Anemia in chronic kidney disease: Secondary | ICD-10-CM | POA: Diagnosis present

## 2022-10-19 DIAGNOSIS — L299 Pruritus, unspecified: Secondary | ICD-10-CM | POA: Diagnosis present

## 2022-10-19 DIAGNOSIS — E875 Hyperkalemia: Secondary | ICD-10-CM | POA: Diagnosis not present

## 2022-10-19 DIAGNOSIS — Z9221 Personal history of antineoplastic chemotherapy: Secondary | ICD-10-CM

## 2022-10-19 DIAGNOSIS — N186 End stage renal disease: Secondary | ICD-10-CM | POA: Diagnosis present

## 2022-10-19 DIAGNOSIS — K3184 Gastroparesis: Secondary | ICD-10-CM | POA: Diagnosis present

## 2022-10-19 DIAGNOSIS — G2581 Restless legs syndrome: Secondary | ICD-10-CM | POA: Diagnosis present

## 2022-10-19 DIAGNOSIS — K625 Hemorrhage of anus and rectum: Secondary | ICD-10-CM | POA: Diagnosis present

## 2022-10-19 DIAGNOSIS — F32A Depression, unspecified: Secondary | ICD-10-CM | POA: Diagnosis present

## 2022-10-19 DIAGNOSIS — E1143 Type 2 diabetes mellitus with diabetic autonomic (poly)neuropathy: Secondary | ICD-10-CM | POA: Diagnosis present

## 2022-10-19 DIAGNOSIS — M109 Gout, unspecified: Secondary | ICD-10-CM | POA: Diagnosis present

## 2022-10-19 DIAGNOSIS — N2581 Secondary hyperparathyroidism of renal origin: Secondary | ICD-10-CM | POA: Diagnosis present

## 2022-10-19 DIAGNOSIS — I10 Essential (primary) hypertension: Secondary | ICD-10-CM | POA: Diagnosis present

## 2022-10-19 DIAGNOSIS — K641 Second degree hemorrhoids: Secondary | ICD-10-CM | POA: Diagnosis present

## 2022-10-19 DIAGNOSIS — K219 Gastro-esophageal reflux disease without esophagitis: Secondary | ICD-10-CM | POA: Diagnosis present

## 2022-10-19 DIAGNOSIS — E1129 Type 2 diabetes mellitus with other diabetic kidney complication: Secondary | ICD-10-CM | POA: Diagnosis not present

## 2022-10-19 DIAGNOSIS — E871 Hypo-osmolality and hyponatremia: Secondary | ICD-10-CM | POA: Diagnosis present

## 2022-10-19 DIAGNOSIS — D649 Anemia, unspecified: Secondary | ICD-10-CM | POA: Diagnosis not present

## 2022-10-19 DIAGNOSIS — Z6837 Body mass index (BMI) 37.0-37.9, adult: Secondary | ICD-10-CM

## 2022-10-19 DIAGNOSIS — I5032 Chronic diastolic (congestive) heart failure: Secondary | ICD-10-CM | POA: Diagnosis present

## 2022-10-19 DIAGNOSIS — F419 Anxiety disorder, unspecified: Secondary | ICD-10-CM | POA: Diagnosis present

## 2022-10-19 DIAGNOSIS — I2510 Atherosclerotic heart disease of native coronary artery without angina pectoris: Secondary | ICD-10-CM | POA: Diagnosis present

## 2022-10-19 DIAGNOSIS — E7800 Pure hypercholesterolemia, unspecified: Secondary | ICD-10-CM | POA: Diagnosis present

## 2022-10-19 DIAGNOSIS — N200 Calculus of kidney: Secondary | ICD-10-CM | POA: Diagnosis present

## 2022-10-19 DIAGNOSIS — Z800 Family history of malignant neoplasm of digestive organs: Secondary | ICD-10-CM

## 2022-10-19 DIAGNOSIS — D500 Iron deficiency anemia secondary to blood loss (chronic): Secondary | ICD-10-CM | POA: Diagnosis present

## 2022-10-19 DIAGNOSIS — K5731 Diverticulosis of large intestine without perforation or abscess with bleeding: Principal | ICD-10-CM | POA: Diagnosis present

## 2022-10-19 DIAGNOSIS — E1122 Type 2 diabetes mellitus with diabetic chronic kidney disease: Secondary | ICD-10-CM | POA: Diagnosis present

## 2022-10-19 DIAGNOSIS — Z79899 Other long term (current) drug therapy: Secondary | ICD-10-CM

## 2022-10-19 DIAGNOSIS — G63 Polyneuropathy in diseases classified elsewhere: Secondary | ICD-10-CM | POA: Diagnosis present

## 2022-10-19 DIAGNOSIS — K922 Gastrointestinal hemorrhage, unspecified: Secondary | ICD-10-CM | POA: Diagnosis present

## 2022-10-19 DIAGNOSIS — M792 Neuralgia and neuritis, unspecified: Secondary | ICD-10-CM | POA: Diagnosis present

## 2022-10-19 DIAGNOSIS — Z992 Dependence on renal dialysis: Secondary | ICD-10-CM | POA: Diagnosis not present

## 2022-10-19 DIAGNOSIS — Z833 Family history of diabetes mellitus: Secondary | ICD-10-CM

## 2022-10-19 DIAGNOSIS — Z8249 Family history of ischemic heart disease and other diseases of the circulatory system: Secondary | ICD-10-CM

## 2022-10-19 DIAGNOSIS — Z8349 Family history of other endocrine, nutritional and metabolic diseases: Secondary | ICD-10-CM

## 2022-10-19 DIAGNOSIS — I132 Hypertensive heart and chronic kidney disease with heart failure and with stage 5 chronic kidney disease, or end stage renal disease: Secondary | ICD-10-CM | POA: Diagnosis present

## 2022-10-19 DIAGNOSIS — Z886 Allergy status to analgesic agent status: Secondary | ICD-10-CM

## 2022-10-19 LAB — COMPREHENSIVE METABOLIC PANEL WITH GFR
ALT: 8 U/L (ref 0–44)
AST: 19 U/L (ref 15–41)
Albumin: 3.6 g/dL (ref 3.5–5.0)
Alkaline Phosphatase: 83 U/L (ref 38–126)
Anion gap: 15 (ref 5–15)
BUN: 39 mg/dL — ABNORMAL HIGH (ref 6–20)
CO2: 29 mmol/L (ref 22–32)
Calcium: 8.2 mg/dL — ABNORMAL LOW (ref 8.9–10.3)
Chloride: 93 mmol/L — ABNORMAL LOW (ref 98–111)
Creatinine, Ser: 5.78 mg/dL — ABNORMAL HIGH (ref 0.44–1.00)
GFR, Estimated: 8 mL/min — ABNORMAL LOW
Glucose, Bld: 115 mg/dL — ABNORMAL HIGH (ref 70–99)
Potassium: 4.2 mmol/L (ref 3.5–5.1)
Sodium: 137 mmol/L (ref 135–145)
Total Bilirubin: 0.6 mg/dL (ref 0.3–1.2)
Total Protein: 7.6 g/dL (ref 6.5–8.1)

## 2022-10-19 LAB — CBC
HCT: 26.6 % — ABNORMAL LOW (ref 36.0–46.0)
Hemoglobin: 8.1 g/dL — ABNORMAL LOW (ref 12.0–15.0)
MCH: 24.8 pg — ABNORMAL LOW (ref 26.0–34.0)
MCHC: 30.5 g/dL (ref 30.0–36.0)
MCV: 81.3 fL (ref 80.0–100.0)
Platelets: 228 10*3/uL (ref 150–400)
RBC: 3.27 MIL/uL — ABNORMAL LOW (ref 3.87–5.11)
RDW: 19.5 % — ABNORMAL HIGH (ref 11.5–15.5)
WBC: 5.4 10*3/uL (ref 4.0–10.5)
nRBC: 0.6 % — ABNORMAL HIGH (ref 0.0–0.2)

## 2022-10-19 LAB — PROTIME-INR
INR: 1.2 (ref 0.8–1.2)
Prothrombin Time: 15 s (ref 11.4–15.2)

## 2022-10-19 LAB — LIPASE, BLOOD: Lipase: 39 U/L (ref 11–51)

## 2022-10-19 MED ORDER — ONDANSETRON HCL 4 MG PO TABS: 4.0000 mg | ORAL_TABLET | Freq: Four times a day (QID) | ORAL | Status: DC | PRN

## 2022-10-19 MED ORDER — HYDRALAZINE HCL 10 MG PO TABS: 10.0000 mg | ORAL_TABLET | Freq: Four times a day (QID) | ORAL | Status: DC | PRN

## 2022-10-19 MED ORDER — GABAPENTIN 300 MG PO CAPS
300.0000 mg | ORAL_CAPSULE | Freq: Three times a day (TID) | ORAL | Status: DC
  Administered 2022-10-20 – 2022-10-23 (×11): 300 mg via ORAL
  Filled 2022-10-19 (×11): qty 1

## 2022-10-19 MED ORDER — ONDANSETRON HCL 4 MG/2ML IJ SOLN: 4.0000 mg | Freq: Four times a day (QID) | INTRAMUSCULAR | Status: DC | PRN

## 2022-10-19 MED ORDER — DULOXETINE HCL 30 MG PO CPEP
30.0000 mg | ORAL_CAPSULE | Freq: Every day | ORAL | Status: DC
  Administered 2022-10-20 – 2022-10-23 (×4): 30 mg via ORAL
  Filled 2022-10-19 (×4): qty 1

## 2022-10-19 MED ORDER — HYDROMORPHONE HCL 1 MG/ML IJ SOLN
0.5000 mg | INTRAMUSCULAR | Status: AC | PRN
  Administered 2022-10-20 (×2): 0.5 mg via INTRAVENOUS
  Filled 2022-10-19 (×2): qty 0.5

## 2022-10-19 MED ORDER — ROPINIROLE HCL 1 MG PO TABS
1.0000 mg | ORAL_TABLET | Freq: Every day | ORAL | Status: DC
  Administered 2022-10-20 – 2022-10-22 (×4): 1 mg via ORAL
  Filled 2022-10-19 (×4): qty 1

## 2022-10-19 MED ORDER — HYDROMORPHONE HCL 1 MG/ML IJ SOLN
0.5000 mg | Freq: Once | INTRAMUSCULAR | Status: AC
  Administered 2022-10-19: 0.5 mg via INTRAVENOUS
  Filled 2022-10-19: qty 0.5

## 2022-10-19 MED ORDER — PANTOPRAZOLE SODIUM 40 MG PO TBEC
80.0000 mg | DELAYED_RELEASE_TABLET | Freq: Two times a day (BID) | ORAL | Status: DC
  Administered 2022-10-20 – 2022-10-23 (×8): 80 mg via ORAL
  Filled 2022-10-19 (×8): qty 2

## 2022-10-19 MED ORDER — POLYETHYLENE GLYCOL 3350 17 G PO PACK: 17.0000 g | PACK | Freq: Every day | ORAL | Status: DC | PRN

## 2022-10-19 MED ORDER — TRAZODONE HCL 50 MG PO TABS
50.0000 mg | ORAL_TABLET | Freq: Every evening | ORAL | Status: DC | PRN
  Administered 2022-10-21 – 2022-10-22 (×2): 50 mg via ORAL
  Filled 2022-10-19 (×2): qty 1

## 2022-10-19 MED ORDER — CARBIDOPA-LEVODOPA 25-100 MG PO TABS
1.0000 | ORAL_TABLET | Freq: Three times a day (TID) | ORAL | Status: DC
  Administered 2022-10-20 – 2022-10-23 (×11): 1 via ORAL
  Filled 2022-10-19 (×12): qty 1

## 2022-10-19 MED ORDER — VITAMIN C 500 MG PO TABS
500.0000 mg | ORAL_TABLET | Freq: Two times a day (BID) | ORAL | Status: DC
  Administered 2022-10-20 – 2022-10-23 (×7): 500 mg via ORAL
  Filled 2022-10-19 (×7): qty 1

## 2022-10-19 MED ORDER — SODIUM CHLORIDE 0.9 % IV SOLN
25.0000 mg | Freq: Four times a day (QID) | INTRAVENOUS | Status: DC | PRN
  Filled 2022-10-19: qty 1

## 2022-10-19 MED ORDER — ACETAMINOPHEN 325 MG PO TABS
650.0000 mg | ORAL_TABLET | Freq: Four times a day (QID) | ORAL | Status: DC | PRN
  Administered 2022-10-21 – 2022-10-23 (×2): 650 mg via ORAL
  Filled 2022-10-19: qty 2

## 2022-10-19 MED ORDER — RENA-VITE PO TABS
1.0000 | ORAL_TABLET | Freq: Every day | ORAL | Status: DC
  Administered 2022-10-20 – 2022-10-22 (×3): 1 via ORAL
  Filled 2022-10-19 (×3): qty 1

## 2022-10-19 MED ORDER — SENNOSIDES-DOCUSATE SODIUM 8.6-50 MG PO TABS: 1.0000 | ORAL_TABLET | Freq: Every evening | ORAL | Status: DC | PRN

## 2022-10-19 MED ORDER — AMLODIPINE BESYLATE 10 MG PO TABS
10.0000 mg | ORAL_TABLET | Freq: Every day | ORAL | Status: DC
  Administered 2022-10-20 – 2022-10-23 (×4): 10 mg via ORAL
  Filled 2022-10-19 (×2): qty 1
  Filled 2022-10-19: qty 2
  Filled 2022-10-19: qty 1

## 2022-10-19 MED ORDER — HYDROCOD POLI-CHLORPHE POLI ER 10-8 MG/5ML PO SUER: 5.0000 mL | Freq: Two times a day (BID) | ORAL | Status: DC | PRN

## 2022-10-19 MED ORDER — ISOSORBIDE MONONITRATE ER 60 MG PO TB24
60.0000 mg | ORAL_TABLET | Freq: Every evening | ORAL | Status: DC
  Administered 2022-10-20 – 2022-10-22 (×2): 60 mg via ORAL
  Filled 2022-10-19 (×4): qty 1

## 2022-10-19 MED ORDER — ALPRAZOLAM 0.5 MG PO TABS: 1.0000 mg | ORAL_TABLET | Freq: Three times a day (TID) | ORAL | Status: DC | PRN

## 2022-10-19 MED ORDER — LANTHANUM CARBONATE 500 MG PO CHEW: 1000.0000 mg | CHEWABLE_TABLET | ORAL | Status: DC

## 2022-10-19 MED ORDER — PREGABALIN 75 MG PO CAPS: 75.0000 mg | ORAL_CAPSULE | Freq: Every day | ORAL | Status: DC

## 2022-10-19 MED ORDER — HYDRALAZINE HCL 50 MG PO TABS
75.0000 mg | ORAL_TABLET | Freq: Three times a day (TID) | ORAL | Status: DC
  Administered 2022-10-20 – 2022-10-23 (×11): 75 mg via ORAL
  Filled 2022-10-19: qty 2
  Filled 2022-10-19 (×5): qty 1
  Filled 2022-10-19: qty 2
  Filled 2022-10-19 (×4): qty 1

## 2022-10-19 MED ORDER — ALPRAZOLAM 0.5 MG PO TABS
1.0000 mg | ORAL_TABLET | Freq: Three times a day (TID) | ORAL | Status: DC | PRN
  Administered 2022-10-21 – 2022-10-22 (×2): 1 mg via ORAL
  Filled 2022-10-19 (×2): qty 2

## 2022-10-19 MED ORDER — HYDROXYZINE HCL 25 MG PO TABS
25.0000 mg | ORAL_TABLET | Freq: Four times a day (QID) | ORAL | Status: DC | PRN
  Administered 2022-10-20 – 2022-10-23 (×4): 25 mg via ORAL
  Filled 2022-10-19 (×4): qty 1

## 2022-10-19 MED ORDER — ALLOPURINOL 100 MG PO TABS
100.0000 mg | ORAL_TABLET | Freq: Every day | ORAL | Status: DC
  Administered 2022-10-20 – 2022-10-22 (×4): 100 mg via ORAL
  Filled 2022-10-19 (×4): qty 1

## 2022-10-19 MED ORDER — ONDANSETRON HCL 4 MG/2ML IJ SOLN
4.0000 mg | Freq: Once | INTRAMUSCULAR | Status: AC
  Administered 2022-10-19: 4 mg via INTRAVENOUS
  Filled 2022-10-19: qty 2

## 2022-10-19 MED ORDER — PROMETHAZINE HCL 25 MG PO TABS
25.0000 mg | ORAL_TABLET | Freq: Three times a day (TID) | ORAL | Status: DC | PRN
  Administered 2022-10-20 – 2022-10-21 (×2): 25 mg via ORAL
  Filled 2022-10-19 (×3): qty 1

## 2022-10-19 MED ORDER — SODIUM CHLORIDE 0.9 % IV SOLN: 12.5000 mg | Freq: Four times a day (QID) | INTRAVENOUS | Status: DC | PRN

## 2022-10-19 MED ORDER — CARVEDILOL 25 MG PO TABS
25.0000 mg | ORAL_TABLET | Freq: Two times a day (BID) | ORAL | Status: DC
  Administered 2022-10-20 – 2022-10-23 (×7): 25 mg via ORAL
  Filled 2022-10-19 (×4): qty 1
  Filled 2022-10-19: qty 4
  Filled 2022-10-19 (×2): qty 1

## 2022-10-19 MED ORDER — ACETAMINOPHEN 650 MG RE SUPP: 650.0000 mg | Freq: Four times a day (QID) | RECTAL | Status: DC | PRN

## 2022-10-19 NOTE — ED Provider Notes (Signed)
[facility] Provider Note    Event Date/Time   First MD Initiated Contact with Patient 10/19/22 2006     (approximate)   History   Rectal Bleeding   HPI  Patricia [name] is a 47 y.o. female  with h/o CHF, HTN, ESRD on PD, h/o GIB,  here with rectal bleeding. Pt reports that throughout the day today she has had bright red, bloody bowel movements. She noticed an urge to have a BM quickly earlier today, and thought she had diarrhea. When she looked, she had grossly bloody stool with clots. She has had 3 additional episodes since then. Reports she has felt weak, but not lightheaded. No CP. She has a reported h/o GIB but is not sure what caused it. She is not on anticoagulation but is on dialysis.      Physical Exam   Triage Vital Signs: ED Triage Vitals  Encounter Vitals Group     BP 10/19/22 1823 (!) 142/95     Systolic BP Percentile --      Diastolic BP Percentile --      Pulse Rate 10/19/22 1823 99     Resp 10/19/22 1823 18     Temp 10/19/22 1823 97.9 F (36.6 C)     Temp src --      SpO2 10/19/22 1823 100 %     Weight 10/19/22 1826 238 lb 5.1 oz (108.1 kg)     Height 10/19/22 1826 5\' 7"  (1.702 m)     Head Circumference --      Peak Flow --      Pain Score 10/19/22 1823 6     Pain Loc --      Pain Education --      Exclude from Growth Chart --     Most recent vital signs: Vitals:   10/19/22 2130 10/19/22 2200  BP: 129/73 (!) 141/85  Pulse: 96 86  Resp: 15 18  Temp:  98.4 F (36.9 C)  SpO2: 99% 99%     General: Awake, no distress.  CV:  Good peripheral perfusion. RRR. Resp:  Normal work of breathing. Lungs clear. Abd:  No distention. No tenderness. No rebound or guarding. Grossly bloody stool noted. Other:  No LE edema   ED Results / Procedures / Treatments   Labs (all labs ordered are listed, but only abnormal results are displayed) Labs Reviewed  COMPREHENSIVE METABOLIC PANEL - Abnormal; Notable for the following  components:      Result Value   Chloride 93 (*)    Glucose, Bld 115 (*)    BUN 39 (*)    Creatinine, Ser 5.78 (*)    Calcium 8.2 (*)    GFR, Estimated 8 (*)    All other components within normal limits  CBC - Abnormal; Notable for the following components:   RBC 3.27 (*)    Hemoglobin 8.1 (*)    HCT 26.6 (*)    MCH 24.8 (*)    RDW 19.5 (*)    nRBC 0.6 (*)    All other components within normal limits  LIPASE, BLOOD  PROTIME-INR  URINALYSIS, ROUTINE W REFLEX MICROSCOPIC  BASIC METABOLIC PANEL  CBC  TYPE AND SCREEN     EKG    RADIOLOGY CT A/P: diverticulosis noted, no apparent free air or surgical abnormality on my prelim review   I also independently reviewed and agree with radiologist interpretations.   PROCEDURES:  Critical Care performed: No   MEDICATIONS ORDERED IN  ED: Medications  isosorbide mononitrate (IMDUR) 24 hr tablet 60 mg (60 mg Oral Not Given 10/19/22 2240)  acetaminophen (TYLENOL) tablet 650 mg (has no administration in time range)    Or  acetaminophen (TYLENOL) suppository 650 mg (has no administration in time range)  hydrALAZINE (APRESOLINE) tablet 10 mg (has no administration in time range)  HYDROmorphone (DILAUDID) injection 0.5 mg (has no administration in time range)  allopurinol (ZYLOPRIM) tablet 100 mg (has no administration in time range)  amLODipine (NORVASC) tablet 10 mg (has no administration in time range)  carvedilol (COREG) tablet 25 mg (has no administration in time range)  hydrALAZINE (APRESOLINE) tablet 75 mg (has no administration in time range)  DULoxetine (CYMBALTA) DR capsule 30 mg (has no administration in time range)  hydrOXYzine (ATARAX) tablet 25 mg (has no administration in time range)  lanthanum (FOSRENOL) chewable tablet 1,000-2,000 mg (has no administration in time range)  pantoprazole (PROTONIX) EC tablet 80 mg (has no administration in time range)  polyethylene glycol (MIRALAX / GLYCOLAX) packet 17 g (has no  administration in time range)  carbidopa-levodopa (SINEMET IR) 25-100 MG per tablet immediate release 1 tablet (has no administration in time range)  gabapentin (NEURONTIN) capsule 300 mg (has no administration in time range)  pregabalin (LYRICA) capsule 75 mg (has no administration in time range)  rOPINIRole (REQUIP) tablet 1 mg (has no administration in time range)  ascorbic acid (VITAMIN C) tablet 500 mg (has no administration in time range)  multivitamin (RENA-VIT) tablet 1 tablet (has no administration in time range)  chlorpheniramine-HYDROcodone (TUSSIONEX) 10-8 MG/5ML suspension 5 mL (has no administration in time range)  promethazine (PHENERGAN) tablet 25 mg (has no administration in time range)  promethazine (PHENERGAN) 25 mg in sodium chloride 0.9 % 50 mL IVPB (has no administration in time range)  traZODone (DESYREL) tablet 50 mg (has no administration in time range)  ALPRAZolam (XANAX) tablet 1 mg (has no administration in time range)  HYDROmorphone (DILAUDID) injection 0.5 mg (0.5 mg Intravenous Given 10/19/22 2127)  ondansetron (ZOFRAN) injection 4 mg (4 mg Intravenous Given 10/19/22 2126)     IMPRESSION / MDM / ASSESSMENT AND PLAN / ED COURSE  I reviewed the triage vital signs and the nursing notes.                              Differential diagnosis includes, but is not limited to, LGIB, diverticulosis, AVM, gastritis, PUD.  Patient's presentation is most consistent with acute presentation with potential threat to life or bodily function.  The patient is on the cardiac monitor to evaluate for evidence of arrhythmia and/or significant heart rate changes  47 yo F with PMHx HTN, depression, GERD, ESRD, here with rectal bleeding. Hgb is at baseline. Pt has gross blood per rectum on exam, however. Reviewed her prior colonoscopy which notes diverticulosis. CT scan pending, will admit to medicine. Hgb 8.1, WBC 5.4. CMP with lytes acceptable for ESRD.   FINAL CLINICAL  IMPRESSION(S) / ED DIAGNOSES   Final diagnoses:  Rectal bleeding  Lower GI bleed     Rx / DC Orders   ED Discharge Orders     None        Note:  This document was prepared using Dragon voice recognition software and may include unintentional dictation errors.   Shaune Pollack, MD 10/20/22 [phone]

## 2022-10-19 NOTE — Assessment & Plan Note (Signed)
Home gabapentin 300 mg 3 times daily resumed

## 2022-10-19 NOTE — ED Notes (Signed)
Patient states she is unable to void.  No longer makes urine due to kidney disease/ dialysis. Unable to obtain UA at this time.

## 2022-10-19 NOTE — Assessment & Plan Note (Addendum)
Not on home diabetic medication, presumed secondary to end-stage renal disease Patient had A1c of 4.7 on 08/24/2022 Insulin SSI with agents coverage not ordered on admission due to needlestick sparing for patient A.m. can order insulin SSI if patient's blood glucose continues to elevate

## 2022-10-19 NOTE — H&P (Addendum)
History and Physical   Patricia [name] EXB:[id number] DOB: 1975-03-30 DOA: 10/19/2022  PCP: Rick Duff, PA-C  Outpatient Specialists: Dr. Marijean Niemann cardiology Patient coming from: Home  I have personally briefly reviewed patient's old medical records in [facility] EMR.  Chief Concern: Rectal bleed  HPI: [name] is a 47 year old female with history of hypertension, depression, anxiety, restless leg syndrome, GERD, end-stage renal disease on hemodialysis, gastroparesis, who presents emergency department for chief concerns of rectal bleed.  Vitals in the ED showed temperature of 97.9, respiration rate of 18, heart rate of 99, blood pressure 142/95, SpO2 100% on room air.  Serum sodium is 137, potassium 4.2, chloride 93, bicarb 29, BUN of 39, serum creatinine of 5.78, EGFR of 8, WBC 5.4, hemoglobin 8.1, platelets of 228.  CT abdomen pelvis without contrast: pending read.  ED treatment: Dilaudid 0.5 mg IV, ondansetron 4 mg IV ------------------------------- At bedside, she is able to tell me her name, age, current location, current year.  She reports last week, she developed rectal bleeding on Wednesday, and was told it was hemorrhoids.  She was sent home from the ED.  She reports the bleeding improved and then she developed bleeding with clots today before HD. She was able to complete HD.  The bleeding and large clot continued, prompting her to present to the emergency department for further evaluation.  She endorses nausea and denies vomiting. She reports she does not get menses anymore since 2010 after hysterectomy.  Social history: She lives at home with her daughters and granddaughter. She denies tobacco, etoh, recreational drug use. She is disabled.  ROS: Constitutional: no weight change, no fever ENT/Mouth: no sore throat, no rhinorrhea Eyes: no eye pain, no vision changes Cardiovascular: no chest pain, no dyspnea,  no edema, no  palpitations Respiratory: no cough, no sputum, no wheezing Gastrointestinal: + nausea, no vomiting, no diarrhea, no constipation, + rectal bleed Genitourinary: no urinary incontinence, no dysuria, no hematuria Musculoskeletal: no arthralgias, no myalgias Skin: no skin lesions, no pruritus, Neuro: + weakness, no loss of consciousness, no syncope Psych: no anxiety, no depression, + decrease appetite Heme/Lymph: no bruising, no bleeding  ED Course: Discussed with EDP, patient requiring hospitalization for chief concerns of rectal bleed.  Assessment/Plan  Principal Problem:   Acute on chronic anemia Active Problems:   ESRD (end stage renal disease) (HCC)   Hypertension   Type 2 diabetes mellitus with other diabetic kidney complication (HCC)   Gout   Morbid obesity (HCC)   Hypercholesterolemia   Polyneuropathy associated with underlying disease (HCC)   Neuropathic pain   Restless leg syndrome   Assessment and Plan:  * Acute on chronic anemia Suspect secondary to diverticular bleed Baseline hemoglobin is 7.5-9.0 Patient's hemoglobin is within range, no indication for transfusion at this time Recheck CBC in the a.m., if patient's hemoglobin continues to decline, would recommend a.m. team to consult GI Check CBC in a.m., goal Hgb > 7.5  ESRD (end stage renal disease) [facility]) Nephrology has been consulted, Dr. Thedore Mins via staff message and epic order for continuation of dialysis as appropriate Home lanthanum 1000 to 2000 mg p.o. resume home dosing  Hypertension Amlodipine 10 mg daily, Coreg 25 mg p.o. twice daily with meals, hydralazine 75 mg every 8 hours, Imdur 60 mg every evening resumed Hydralazine 10 mg p.o. every 6 hours as needed for SBP greater than 175, 4 days ordered  Type 2 diabetes mellitus with other diabetic kidney complication (HCC) Not on home  diabetic medication, presumed secondary to end-stage renal disease Patient had A1c of 4.7 on 08/24/2022 Insulin SSI with  agents coverage not ordered on admission due to needlestick sparing for patient A.m. can order insulin SSI if patient's blood glucose continues to elevate  Morbid obesity (HCC) This complicates overall care and prognosis.   Restless leg syndrome Home ropinirole 1 mg nightly resumed  Neuropathic pain Home gabapentin 300 mg 3 times daily resumed  Chart reviewed.   DVT prophylaxis: TED hose; AM team to initiate pharmacologic DVT prophylaxis when the benefits outweigh the risk Code Status: Full code Diet: Renal/carb modified diet ordered Family Communication: No Disposition Plan: Pending clinical course Consults called: Nephrology Admission status: Telemetry cardiac, inpatient  Past Medical History:  Diagnosis Date   Acid reflux    Anemia 04/2019   REQUIRING BLOOD TRANSFUSIONS   Anxiety    Arthritis    CHF (congestive heart failure) (HCC)    Dyspnea    with exertion   Dysrhythmia    tachycardia   ESRD (end stage renal disease) (HCC)    [facility] [place]   Facial twitching 11/2020   mouth twitch, eyes roll back, neurology started patient on low dose   Sinemet IR.   GI bleeding 12/2017   Gout    Headache(784.0)    "related to chemo; sometimes weekly" (09/12/2013)   Heart murmur    Systolic per Dr Orpah Cobb' notes   High cholesterol    History of blood transfusion "a couple"   "related to low counts"   Hypertension    Iron deficiency anemia    "get epogen shots q month" (02/20/2014)   MCGN (minimal change glomerulonephritis)    "using chemo to tx;  finished my last tx in 12/2013"   Neuropathy    Peritoneal dialysis status (HCC)    Stroke (HCC) 08/15/2017   ruled out that it was not a stroke   Past Surgical History:  Procedure Laterality Date   ABDOMINAL HYSTERECTOMY  2010   "laparoscopic"   ANKLE FRACTURE SURGERY Right 1994   AV FISTULA PLACEMENT Left 04/14/2016   Procedure: ARTERIOVENOUS (AV) FISTULA CREATION;  Surgeon: Chuck Hint, MD;  Location: [facility]  OR;  Service: Vascular;  Laterality: Left;   AV FISTULA PLACEMENT Left 08/09/2017   Procedure: INSERTION OF ARTERIOVENOUS (AV) GORE-TEX GRAFT LEFT ARM;  Surgeon: Sherren Kerns, MD;  Location: [facility] OR;  Service: Vascular;  Laterality: Left;   AV FISTULA PLACEMENT Right 03/15/2020   Procedure: RIGHT ARM ARTERIOVENOUS (AV) FISTULA CREATION 1st Stage Basilic Transposition;  Surgeon: Nada Libman, MD;  Location: [facility] OR;  Service: Vascular;  Laterality: Right;   AV FISTULA PLACEMENT Right 02/24/2021   Procedure: RIGHT ARM ARTERIOVENOUS  FISTULA CREATION;  Surgeon: Victorino Sparrow, MD;  Location[facility] OR;  Service: Vascular;  Laterality: Right;  regional block   AVGG REMOVAL Left 08/11/2017   Procedure: REMOVAL OF ARTERIOVENOUS GORETEX GRAFT (AVGG);  Surgeon: Nada Libman, MD;  Location: [facility] OR;  Service: Vascular;  Laterality: Left;   BASCILIC VEIN TRANSPOSITION Right 05/17/2020   Procedure: RIGHT UPPER EXTREMITY SECOND STAGE BASCILIC VEIN TRANSPOSITION;  Surgeon: Nada Libman, MD;  Location: [facility] OR;  Service: Vascular;  Laterality: Right;   BIOPSY  09/03/2017   Procedure: BIOPSY;  Surgeon: Kathi Der, MD;  Location: [facility] ENDOSCOPY;  Service: Gastroenterology;;   BIOPSY  12/24/2017   Procedure: BIOPSY;  Surgeon: Lemar Lofty., MD;  Location: [facility] ENDOSCOPY;  Service: Gastroenterology;;   CARDIAC CATHETERIZATION  2000's   COLONOSCOPY WITH PROPOFOL N/A 09/04/2017   Procedure: COLONOSCOPY WITH PROPOFOL;  Surgeon: Kerin Salen, MD;  Location: [facility] ENDOSCOPY;  Service: Gastroenterology;  Laterality: N/A;   ENTEROSCOPY N/A 12/24/2017   Procedure: ENTEROSCOPY;  Surgeon: Meridee Score Netty Starring., MD;  Location: [facility] ENDOSCOPY;  Service: Gastroenterology;  Laterality: N/A;   ESOPHAGOGASTRODUODENOSCOPY     ESOPHAGOGASTRODUODENOSCOPY (EGD) WITH PROPOFOL Left 06/04/2017   Procedure: ESOPHAGOGASTRODUODENOSCOPY (EGD) WITH PROPOFOL;  Surgeon: Willis Modena, MD;  Location: [facility] ENDOSCOPY;  Service: Endoscopy;   Laterality: Left;   ESOPHAGOGASTRODUODENOSCOPY (EGD) WITH PROPOFOL N/A 09/03/2017   Procedure: ESOPHAGOGASTRODUODENOSCOPY (EGD) WITH PROPOFOL;  Surgeon: Kathi Der, MD;  Location: [facility] ENDOSCOPY;  Service: Gastroenterology;  Laterality: N/A;   ESOPHAGOGASTRODUODENOSCOPY (EGD) WITH PROPOFOL N/A 04/27/2022   Procedure: ESOPHAGOGASTRODUODENOSCOPY (EGD) WITH PROPOFOL;  Surgeon: Regis Bill, MD;  Location: [facility] ENDOSCOPY;  Service: Endoscopy;  Laterality: N/A;   FISTULA SUPERFICIALIZATION Left 04/02/2017   Procedure: FISTULA SUPERFICIALIZATION LEFT ARM;  Surgeon: Nada Libman, MD;  Location: [facility] OR;  Service: Vascular;  Laterality: Left;   FISTULA SUPERFICIALIZATION Right 04/14/2021   Procedure: RIGHT ARM FISTULA SUPERFICIALIZATION;  Surgeon: Victorino Sparrow, MD;  Location: [facility] OR;  Service: Vascular;  Laterality: Right;  PERIPHERAL NERVE BLOCK   FISTULOGRAM Left 04/07/2016   Procedure: FISTULOGRAM;  Surgeon: Maeola Harman, MD;  Location: [facility] OR;  Service: Vascular;  Laterality: Left;   FRACTURE SURGERY     right ankle   GIVENS CAPSULE STUDY N/A 10/29/2017   Procedure: GIVENS CAPSULE STUDY;  Surgeon: Charlott Rakes, MD;  Location: [facility] ENDOSCOPY;  Service: Endoscopy;  Laterality: N/A;   GIVENS CAPSULE STUDY N/A 12/24/2017   Procedure: GIVENS CAPSULE STUDY;  Surgeon: Lemar Lofty., MD;  Location: [facility] ENDOSCOPY;  Service: Gastroenterology;  Laterality: N/A;   GIVENS CAPSULE STUDY N/A 05/22/2018   Procedure: GIVENS CAPSULE STUDY;  Surgeon: Lemar Lofty., MD;  Location: [facility] ENDOSCOPY;  Service: Gastroenterology;  Laterality: N/A;   INSERTION OF DIALYSIS CATHETER Right 04/07/2016   Procedure: INSERTION OF DIALYSIS CATHETER;  Surgeon: Maeola Harman, MD;  Location: [facility] OR;  Service: Vascular;  Laterality: Right;   INSERTION OF DIALYSIS CATHETER Right 04/04/2017   Procedure: INSERTION OF DIALYSIS CATHETER;  Surgeon: Maeola Harman, MD;  Location: [facility]  OR;  Service: Vascular;  Laterality: Right;   IR FLUORO GUIDE CV LINE LEFT  03/13/2020   IR US GUIDE VASC ACCESS LEFT  03/13/2020   LEFT HEART CATH AND CORONARY ANGIOGRAPHY N/A 05/12/2019   Procedure: LEFT HEART CATH AND CORONARY ANGIOGRAPHY;  Surgeon: Orpah Cobb, MD;  [facility]: [facility] INVASIVE CV LAB;  Service: Cardiovascular;  Laterality: N/A;   LIGATION OF ARTERIOVENOUS  FISTULA Left 04/14/2016   Procedure: LIGATION OF ARTERIOVENOUS  FISTULA;  Surgeon: Chuck Hint, MD;  Location: [facility] OR;  Service: Vascular;  Laterality: Left;   LIGATION OF COMPETING BRANCHES OF ARTERIOVENOUS FISTULA Right 05/17/2020   Procedure: LIGATION OF COMPETING BRANCHES OF RIGHT ARM ARTERIOVENOUS FISTULA;  Surgeon: Nada Libman, MD;  Location: [facility] OR;  Service: Vascular;  Laterality: Right;   PATCH ANGIOPLASTY Left 06/19/2016   Procedure: PATCH ANGIOPLASTY LEFT ARTERIOVENOUS FISTULA;  Surgeon: Chuck Hint, MD;  Location: [facility] OR;  Service: Vascular;  Laterality: Left;   POLYPECTOMY  09/04/2017   Procedure: POLYPECTOMY;  Surgeon: Kerin Salen, MD;  Location: [facility] ENDOSCOPY;  Service: Gastroenterology;;   REVISON OF ARTERIOVENOUS FISTULA Left 06/19/2016   Procedure: REVISION SUPERFICIALIZATION OF BRACHIOCEPHALIC ARTERIOVENOUS FISTULA;  Surgeon: Chuck Hint, MD;  Location: [facility] OR;  Service: Vascular;  Laterality: Left;   UPPER EXTREMITY VENOGRAPHY Bilateral 02/19/2021   Procedure: UPPER EXTREMITY VENOGRAPHY;  Surgeon: Victorino Sparrow, MD;  Location: [facility] INVASIVE CV LAB;  Service: Cardiovascular;  Laterality: Bilateral;   Social History:  reports that she has never smoked. She has never used smokeless tobacco. She reports that she does not drink alcohol and does not use drugs.  Allergies  Allergen Reactions   Nsaids Other (See Comments)    Cannot take due to Kidney disease/Kidney function  Other Reaction(s): Unknown   Tolmetin Other (See Comments)    Cannot take due to Kidney Disease   Family History   Problem Relation Age of Onset   Hypertension Mother    Thyroid disease Mother    Coronary artery disease Father    Hypertension Father    Diabetes Father    Colon cancer Maternal Aunt    Esophageal cancer Maternal Uncle    Inflammatory bowel disease Neg Hx    Liver disease Neg Hx    Pancreatic cancer Neg Hx    Rectal cancer Neg Hx    Stomach cancer Neg Hx    Family history: Family history reviewed and not pertinent.  Prior to Admission medications   Medication Sig Start Date End Date Taking? Authorizing Provider  acetaminophen (TYLENOL) 500 MG tablet Take 1,000 mg by mouth every 6 (six) hours as needed for moderate pain.    [provider]  allopurinol (ZYLOPRIM) 100 MG tablet Take 100 mg by mouth at bedtime.     [provider]  ALPRAZolam Prudy Feeler) 1 MG tablet Take 1 mg by mouth 3 (three) times daily as needed for anxiety or sleep.    [provider]  amLODipine (NORVASC) 10 MG tablet Take 1 tablet (10 mg total) by mouth daily. 07/25/22   Wouk, Wilfred Curtis, MD  ascorbic acid (VITAMIN C) 500 MG tablet Take 1 tablet (500 mg total) by mouth 2 (two) times daily. 08/13/22   Arnetha Courser, MD  benzonatate (TESSALON) 200 MG capsule Take 1 capsule (200 mg total) by mouth 3 (three) times daily as needed for cough. 08/13/22   Arnetha Courser, MD  carbidopa-levodopa (SINEMET IR) 25-100 MG tablet TAKE 1 TABLET BY MOUTH THREE TIMES DAILY 10/06/22   Levert Feinstein, MD  carvedilol (COREG) 25 MG tablet Take 1 tablet (25 mg total) by mouth 2 (two) times daily with a meal. 07/24/22   Wouk, Wilfred Curtis, MD  chlorpheniramine-HYDROcodone (TUSSIONEX) 10-8 MG/5ML Take 5 mLs by mouth every 12 (twelve) hours as needed for cough. 08/13/22   Arnetha Courser, MD  Diclofenac Sodium 1 % CREA 1 gram qid prn 05/26/21   Levert Feinstein, MD  DULoxetine (CYMBALTA) 30 MG capsule Take 1 capsule (30 mg total) by mouth daily. 05/06/22   Levert Feinstein, MD  gabapentin (NEURONTIN) 100 MG capsule TAKE 3 CAPSULES(300 MG) BY  MOUTH THREE TIMES DAILY 10/08/22   Levert Feinstein, MD  gentamicin cream (GARAMYCIN) 0.1 % Apply 1 Application topically 2 (two) times daily. 09/15/22   Felecia Shelling, DPM  hydrALAZINE (APRESOLINE) 25 MG tablet Take 3 tablets (75 mg total) by mouth every 8 (eight) hours. 08/13/22   Arnetha Courser, MD  hydrocortisone (ANUSOL-HC) 2.5 % rectal cream Place 1 Application rectally 2 (two) times daily. 07/01/22   Pilar Jarvis, MD  hydrOXYzine (ATARAX/VISTARIL) 25 MG tablet Take 25 mg by mouth every 6 (six) hours as needed for itching.    [provider]  isosorbide mononitrate (  IMDUR) 60 MG 24 hr tablet Take 1 tablet (60 mg total) by mouth every evening. Skip the dose if systolic BP less than 140 mmHg 05/13/22 05/13/23  Gillis Santa, MD  lanthanum (FOSRENOL) 1000 MG chewable tablet Chew 1,000-2,000 mg by mouth See admin instructions. Take 2 tablets (2000mg ) by mouth three times daily with meals and take 1 tablet (1000mg ) by mouth twice daily with snacks    [provider]  lidocaine-prilocaine (EMLA) cream 1gram tid prn 05/26/21   Levert Feinstein, MD  multivitamin (RENA-VIT) TABS tablet Take 1 tablet by mouth at bedtime.     [provider]  omeprazole (PRILOSEC) 40 MG capsule Take 1 capsule (40 mg total) by mouth daily. 08/13/22   Arnetha Courser, MD  ondansetron (ZOFRAN) 4 MG tablet Take 4 mg by mouth 2 (two) times daily as needed for nausea or vomiting.    [provider]  polyethylene glycol (MIRALAX) 17 g packet Take 17 g by mouth daily. Patient taking differently: Take 17 g by mouth daily as needed for mild constipation or moderate constipation. 07/01/22   Pilar Jarvis, MD  pregabalin (LYRICA) 75 MG capsule Take 75 mg by mouth at bedtime. 08/13/22   [provider]  promethazine (PHENERGAN) 25 MG tablet Take 25 mg by mouth 3 (three) times daily as needed for nausea or vomiting.    [provider]  rOPINIRole (REQUIP) 1 MG tablet Take 1 tablet (1 mg total) by mouth at bedtime.  04/29/22   Levert Feinstein, MD  vortioxetine HBr (TRINTELLIX) 10 MG TABS tablet Take 10 mg by mouth daily.    [provider]  zinc sulfate 220 (50 Zn) MG capsule Take 1 capsule (220 mg total) by mouth daily. 08/13/22   Arnetha Courser, MD  zolpidem (AMBIEN) 5 MG tablet Take 5 mg by mouth at bedtime. 04/07/21   [provider]   Physical Exam: Vitals:   10/19/22 2230 10/19/22 2240 10/19/22 2300 10/19/22 2330  BP: 135/71 129/68 (!) 147/85 138/85  Pulse: 97 93 90 88  Resp:  18  11  Temp:      TempSrc:      SpO2: 98% 98% 100% 100%  Weight:      Height:       Constitutional: appears older than chronological age, NAD, calm Eyes: PERRL, lids and conjunctivae normal ENMT: Mucous membranes are moist. Posterior pharynx clear of any exudate or lesions. Age-appropriate dentition. Hearing appropriate Neck: normal, supple, no masses, no thyromegaly Respiratory: clear to auscultation bilaterally, no wheezing, no crackles. Normal respiratory effort. No accessory muscle use.  Cardiovascular: Regular rate and rhythm, no murmurs / rubs / gallops. No extremity edema. 2+ pedal pulses. No carotid bruits.  Abdomen: obese abdomen, + generalized tenderness, no masses palpated, no hepatosplenomegaly. Bowel sounds positive.  Musculoskeletal: no clubbing / cyanosis. No joint deformity upper and lower extremities. Good ROM, no contractures, no atrophy. Normal muscle tone.  Skin: no rashes, lesions, ulcers. No induration Neurologic: Sensation intact. Strength 5/5 in all 4.  Psychiatric: Normal judgment and insight. Alert and oriented x 3. Normal mood.   EKG: Not indicated at this  Chest x-ray on Admission: Not indicated at this time  CT abdomen and pelvis without contrast has been completed and pending read.  No results found.  Labs on Admission: I have personally reviewed following labs CBC: Recent Labs  Lab 10/14/22 1232 10/19/22 1850  WBC 7.5 5.4  NEUTROABS 5.2  --   HGB 9.0* 8.1*  HCT  31.0*  26.6*  MCV 84.0 81.3  PLT 353 228   Basic Metabolic Panel: Recent Labs  Lab 10/14/22 1232 10/19/22 1850  NA 134* 137  K 3.8 4.2  CL 92* 93*  CO2 26 29  GLUCOSE 86 115*  BUN 20 39*  CREATININE 4.45* 5.78*  CALCIUM 8.4* 8.2*   GFR: Estimated Creatinine Clearance: 14.9 mL/min (A) (by C-G formula based on SCr of 5.78 mg/dL (H)).  Liver Function Tests: Recent Labs  Lab 10/19/22 1850  AST 19  ALT 8  ALKPHOS 83  BILITOT 0.6  PROT 7.6  ALBUMIN 3.6   Recent Labs  Lab 10/19/22 1850  LIPASE 39   Coagulation Profile: Recent Labs  Lab 10/19/22 2049  INR 1.2   Urine analysis:    Component Value Date/Time   COLORURINE COLORLESS (A) 04/03/2018 2319   APPEARANCEUR CLEAR 04/03/2018 2319   LABSPEC 1.006 04/03/2018 2319   PHURINE 8.0 04/03/2018 2319   GLUCOSEU >=500 (A) 04/03/2018 2319   HGBUR NEGATIVE 04/03/2018 2319   BILIRUBINUR NEGATIVE 04/03/2018 2319   KETONESUR NEGATIVE 04/03/2018 2319   PROTEINUR 100 (A) 04/03/2018 2319   UROBILINOGEN 0.2 09/19/2014 1620   NITRITE NEGATIVE 04/03/2018 2319   LEUKOCYTESUR NEGATIVE 04/03/2018 2319   This document was prepared using Dragon Voice Recognition software and may include unintentional dictation errors.  Dr. Sedalia Muta [facility]  If 7PM-7AM, please contact overnight-coverage provider If 7AM-7PM, please contact day attending provider [email or web address]  10/19/2022, 11:57 PM

## 2022-10-19 NOTE — Hospital Course (Addendum)
[name] is a 47 year old female with history of hypertension, depression, anxiety, restless leg syndrome, GERD, end-stage renal disease on hemodialysis, gastroparesis, who presents emergency department for chief concerns of rectal bleed.  Vitals in the ED showed temperature of 97.9, respiration rate of 18, heart rate of 99, blood pressure 142/95, SpO2 100% on room air.  Serum sodium is 137, potassium 4.2, chloride 93, bicarb 29, BUN of 39, serum creatinine of 5.78, EGFR of 8, WBC 5.4, hemoglobin 8.1, platelets of 228.  CT abdomen pelvis without contrast: 1. 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 Question fistulous communication between the rectum and sigmoid colon. 2. Cholelithiasis. 3. Atrophic kidneys with bilateral kidney stones. 4. Aortic atherosclerosis.   GI was consulted and patient underwent colonoscopy on 10/17, find to have diverticulosis, no active bleeding, no specimen taken.  Bleeding likely hemorrhoidal versus diverticular. Patient did received 1 unit of PRBC when hemoglobin dropped to 7.1.  Currently stable at 8. Patient was also started on iron supplement and will continue with Aranesp with dialysis.  Patient received dialysis before discharge and will continue with her routine dialysis sessions.  She will continue with rest of her home medications and need to have a follow-up with her providers for further recommendations.

## 2022-10-19 NOTE — ED Triage Notes (Signed)
Pt comes with c/o rectal bleeding. Pt states this started this morning with large amounts of clots and bright red bleeding. Pt states she was seen here few days ago for same but it was hemorrhoids and nothing like this.  Pt states abdominal pain.  Pt states nausea and no vomiting.

## 2022-10-19 NOTE — Assessment & Plan Note (Addendum)
Suspect secondary to diverticular bleed Baseline hemoglobin is 7.5-9.0 Patient's hemoglobin is within range, no indication for transfusion at this time Recheck CBC in the a.m., if patient's hemoglobin continues to decline, would recommend a.m. team to consult GI Check CBC in a.m., goal Hgb > 7.5

## 2022-10-19 NOTE — Assessment & Plan Note (Addendum)
Nephrology has been consulted, Dr. Thedore Mins via staff message and epic order for continuation of dialysis as appropriate Home lanthanum 1000 to 2000 mg p.o. resume home dosing

## 2022-10-19 NOTE — Assessment & Plan Note (Signed)
Amlodipine 10 mg daily, Coreg 25 mg p.o. twice daily with meals, hydralazine 75 mg every 8 hours, Imdur 60 mg every evening resumed Hydralazine 10 mg p.o. every 6 hours as needed for SBP greater than 175, 4 days ordered

## 2022-10-19 NOTE — ED Notes (Signed)
Pt informed this RN that she just went to the bathroom and had a large amount of blood present again.

## 2022-10-19 NOTE — ED Notes (Signed)
Lab called to come and get blood from pt.

## 2022-10-19 NOTE — Assessment & Plan Note (Signed)
-  This complicates overall care and prognosis.

## 2022-10-19 NOTE — Assessment & Plan Note (Signed)
Home ropinirole 1 mg nightly resumed

## 2022-10-20 ENCOUNTER — Encounter: Payer: Self-pay | Admitting: Internal Medicine

## 2022-10-20 DIAGNOSIS — D649 Anemia, unspecified: Secondary | ICD-10-CM | POA: Diagnosis not present

## 2022-10-20 LAB — HEMOGLOBIN AND HEMATOCRIT, BLOOD
HCT: 24.2 % — ABNORMAL LOW (ref 36.0–46.0)
Hemoglobin: 7.5 g/dL — ABNORMAL LOW (ref 12.0–15.0)

## 2022-10-20 LAB — BASIC METABOLIC PANEL
Anion gap: 15 (ref 5–15)
BUN: 46 mg/dL — ABNORMAL HIGH (ref 6–20)
CO2: 28 mmol/L (ref 22–32)
Calcium: 8.3 mg/dL — ABNORMAL LOW (ref 8.9–10.3)
Chloride: 92 mmol/L — ABNORMAL LOW (ref 98–111)
Creatinine, Ser: 6.66 mg/dL — ABNORMAL HIGH (ref 0.44–1.00)
GFR, Estimated: 7 mL/min — ABNORMAL LOW (ref >60)
Glucose, Bld: 104 mg/dL — ABNORMAL HIGH (ref 70–99)
Potassium: 4.9 mmol/L (ref 3.5–5.1)
Sodium: 135 mmol/L (ref 135–145)

## 2022-10-20 LAB — CBC
HCT: 24.7 % — ABNORMAL LOW (ref 36.0–46.0)
Hemoglobin: 7.5 g/dL — ABNORMAL LOW (ref 12.0–15.0)
MCH: 24.3 pg — ABNORMAL LOW (ref 26.0–34.0)
MCHC: 30.4 g/dL (ref 30.0–36.0)
MCV: 79.9 fL — ABNORMAL LOW (ref 80.0–100.0)
Platelets: 237 10*3/uL (ref 150–400)
RBC: 3.09 MIL/uL — ABNORMAL LOW (ref 3.87–5.11)
RDW: 19.5 % — ABNORMAL HIGH (ref 11.5–15.5)
WBC: 5.3 10*3/uL (ref 4.0–10.5)
nRBC: 0 % (ref 0.0–0.2)

## 2022-10-20 LAB — MRSA NEXT GEN BY PCR, NASAL: MRSA by PCR Next Gen: NOT DETECTED

## 2022-10-20 MED ORDER — PEG 3350-KCL-NA BICARB-NACL 420 G PO SOLR
4000.0000 mL | Freq: Once | ORAL | Status: AC
  Administered 2022-10-20: 4000 mL via ORAL
  Filled 2022-10-20: qty 4000

## 2022-10-20 MED ORDER — SODIUM CHLORIDE 0.9 % IV SOLN: 12.5000 mg | Freq: Four times a day (QID) | INTRAVENOUS | Status: AC | PRN

## 2022-10-20 MED ORDER — PENTAFLUOROPROP-TETRAFLUOROETH EX AERO: 1.0000 | INHALATION_SPRAY | CUTANEOUS | Status: DC | PRN

## 2022-10-20 MED ORDER — HEPARIN SODIUM (PORCINE) 1000 UNIT/ML DIALYSIS: 1000.0000 [IU] | INTRAMUSCULAR | Status: DC | PRN

## 2022-10-20 MED ORDER — INFLUENZA VIRUS VACC SPLIT PF (FLUZONE) 0.5 ML IM SUSY
0.5000 mL | PREFILLED_SYRINGE | INTRAMUSCULAR | Status: DC
  Filled 2022-10-20: qty 0.5

## 2022-10-20 MED ORDER — SODIUM CHLORIDE 0.9 % IV SOLN
12.5000 mg | Freq: Four times a day (QID) | INTRAVENOUS | Status: DC | PRN
  Filled 2022-10-20: qty 0.5

## 2022-10-20 MED ORDER — HYDROMORPHONE HCL 1 MG/ML IJ SOLN
0.5000 mg | INTRAMUSCULAR | Status: DC | PRN
  Administered 2022-10-20: 0.5 mg via INTRAVENOUS
  Filled 2022-10-20: qty 0.5

## 2022-10-20 MED ORDER — SODIUM CHLORIDE 0.9 % IV SOLN
12.5000 mg | Freq: Four times a day (QID) | INTRAVENOUS | Status: AC | PRN
  Administered 2022-10-20: 12.5 mg via INTRAVENOUS

## 2022-10-20 MED ORDER — LANTHANUM CARBONATE 500 MG PO CHEW
2000.0000 mg | CHEWABLE_TABLET | Freq: Three times a day (TID) | ORAL | Status: DC
  Administered 2022-10-20 – 2022-10-23 (×9): 2000 mg via ORAL
  Filled 2022-10-20 (×13): qty 4

## 2022-10-20 MED ORDER — OXYCODONE HCL 5 MG PO TABS
5.0000 mg | ORAL_TABLET | Freq: Four times a day (QID) | ORAL | Status: AC | PRN
  Administered 2022-10-20 – 2022-10-21 (×2): 5 mg via ORAL
  Filled 2022-10-20 (×2): qty 1

## 2022-10-20 MED ORDER — LIDOCAINE-PRILOCAINE 2.5-2.5 % EX CREA: 1.0000 | TOPICAL_CREAM | CUTANEOUS | Status: DC | PRN

## 2022-10-20 MED ORDER — LANTHANUM CARBONATE 500 MG PO CHEW: 1000.0000 mg | CHEWABLE_TABLET | ORAL | Status: DC | PRN

## 2022-10-20 NOTE — Consult Note (Signed)
Consultation  Referring Provider:     Dr Sedalia Muta Admit date 10/19/22 Consult date  10/20/22       Reason for Consultation:     rectal bleeding         HPI:   Patricia [name] is a 47 y.o. 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 She was seen at [facility] last month to arrange pre-transplant colonoscopy - and to follow up on chronic constipation/gerd/gastroparesis. There was no rectal bleeding at that time. Motegrity was considered but decided against due to her esrd/dialysis after discussion with her transplant team- so was to use miralax/senna. Colonoscopy arranged for next month.  Patient reports she had some bright red material per rectum in small amount without any significant symptoms last week- seen in ed and attributed to hemorrhoids- treated/released for follow up care with PCP. States yesterday she woke up feeling well and while she was getting ready to go to dialysis she had a significant urge to defecate- went to commode and passed a large amount of darker red/maroonish material. Attributed this to hemorrhoids. Went to dialysis, developed some significant suprapubic lower abdominal pain that diffused to her whole abdomen. Completed dialysis, went home and had a few more dark red/marronish stools that had some clotty material and filled the toilet. Came to ed. Had 2 more similar stools- last was late last night. States her abdominal pain ha lessened but still present. Hgb 7.5- this is somewhat decreased from admission but within her range over the last couple of months. She endorses some mild nausea but no vomiting/heartburn, other GI symptoms. Denies tobacco/etoh/recreational substances. Has been taking stool softeners and ppi  since she was seen last month at [facility] and notes some improvement states she had a large bowel movement Sunday that was without signs of bleeding. Labs reviewed as below. She denies any history of dvt/pe/vascular event. Had CT A/P as below. Note a prior history of friable/congested colonic mucosa 2019 and avms in the mid/distal ileum 2020. She had an omelet this morning but no solid food since. There was a single grandparent with colon cancer but no one else.    PREVIOUS ENDOSCOPIES:             06/04/2017 EGD: Patchy moderate inflammation characterized by congestion (edema), erosions and shallow ulcerations found in the prepyloric region of the stomach. The exam of the stomach was otherwise normal. Localized mild inflammation characterized by congestion (edema) and erythema was found in the duodenal bulb.The exam of the duodenum was otherwise normal.   - 09/03/2017: EGD: A non-obstructing Schatzki ring was found at the gastroesophageal junction.A small hiatal hernia was present.Abnormal motility was noted in the esophagus.Localized mild inflammation characterized by congestion (edema) and erythema was found in the prepyloric region of the stomach. Scattered mild inflammation was found in the duodenal bulb.The second portion of the duodenum was normal.  09/04/2017 Colonoscopy: patch area of moderately congested and erythematous mucosa in the sigmoid, transverse and ascending colon, mucosal friability in the ascending colon, minimal fresh blood in the cecum, no active bleeding on thorough lavage. Diverticulosis sigmoid and descending colon. Two 3 to 8 mm polyps in the transverse colon- repeat 3-5 years.  11/02/2017 Small bowel capsule endoscopy: mild gastritis and duodenitis, no source of anemia identified.  12/24/2017 Small bowel endoscopy: Non-bleeding gastric ulcer with a clean ulcer base (Forrest Class III). Non-bleeding erosive gastropathy.- Erythematous duodenopathy in bulb. Normal mucosa was found  in the jejunum. Tattooed distal extent to demarcate distal extent of Push SBE.-Successful completion of the Video Capsule Enteroscope placement.  02/11/2018 Small bowel capsule endoscopy: incomplete capsule study, retained capsule, 2-3 non bleeding AVMs found 2 hrs and 34 minutes too distal to approach with a regular enteroscope.   02/18/2018 EGD: No gross lesions in esophagus. 3 cm hiatal hernia. Gastroesophageal flap valve classified as Hill Grade IV. Previous ulcer has healed.- No gross lesions in the duodenal bulb, in the first portion of the duodenum, in the second portion of the duodenum, in the third portion of the duodenum and in the fourth portion of the duodenum. Prominent Ampulla.- Normal mucosa was found in the proximal jejunum. Tattooed distal most extent of enteroscopy.  05/22/2018 Capsule Endoscopy: Impression: Small bowel AVMs likely in the mid/distal ileum. No active bleeding. Beyond the reach of conventional push enteroscopy.  07/05/2018: Antegrade double balloon enteroscopy: Occasion: Abnormal video capsule endoscopy showing small AVMs in proximal third of the small bowel, IDA requiring blood transfusion times 03/25/2018: Impression normal esophagus, large amount of food residue in the stomach, normal examined duodenum, 2 nonbleeding angiectasia's in the jejunum. Treated with APC. The examined portion of the ileum was normal. Recommendation continue local monitoring blood counts and iron stores. If anemia progresses, will consider medical therapy with octreotide given underlying renal disease as retrograde exam would be challenging with habitus and low yield given proximal predominance of capsule findings.  - 04/27/2022: EGD: Dysphagia, Nausea with vomiting Impression: - 2 cm hiatal hernia. - Granular and texture changed mucosa in the gastric fundus and gastric body. Polypoid lesion that's  representative of the abnormal mucosa was resected.- Normal examined duodenum.Normal EGD biopsies.  Gastritis- Inflammation, especially mucosal, of the stomach.Negative for H. Pylori, Barrett's Esophagus, dysplasia and malignancy. No further repeat is needed at this time.   -09/30/2020: CT A/P WO - Mild atherosclerotic calcifications, with minimal calcifications of the common and external iliac arteries. Chronic and incidental findings, as described above.   10/19/22- CT w/o- IMPRESSION: 1. 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 Question fistulous communication between the rectum and sigmoid colon. 2. Cholelithiasis. 3. Atrophic kidneys with bilateral kidney stones. 4. Aortic atherosclerosis.  GES 3/24- gastroparesis RUQ [place] 2/24- IMPRESSION: Gallbladder sludge without evidence of acute cholecystitis.   Past Medical History:  Diagnosis Date   Acid reflux    Anemia 04/2019   REQUIRING BLOOD TRANSFUSIONS   Anxiety    Arthritis    CHF (congestive heart failure) (HCC)    Dyspnea    with exertion   Dysrhythmia    tachycardia   ESRD (end stage renal disease) (HCC)    [facility]   Facial twitching 11/2020   mouth twitch, eyes roll back, neurology started patient on low dose   Sinemet IR.   GI bleeding 12/2017   Gout    Headache(784.0)    "related to chemo; sometimes weekly" (09/12/2013)   Heart murmur    Systolic per Dr Orpah Cobb' notes   High cholesterol    History of blood transfusion "a couple"   "related to low counts"   Hypertension    Iron deficiency anemia    "get epogen shots q month" (02/20/2014)   MCGN (minimal change glomerulonephritis)    "using chemo to tx;  finished my last tx in  12/2013"   Neuropathy    Peritoneal dialysis status (HCC)    Stroke (HCC) 08/15/2017   ruled out that it was not a stroke    Past Surgical History:  Procedure Laterality Date   ABDOMINAL HYSTERECTOMY  2010   "laparoscopic"   ANKLE FRACTURE SURGERY Right 1994   AV FISTULA PLACEMENT Left 04/14/2016   Procedure:  ARTERIOVENOUS (AV) FISTULA CREATION;  Surgeon: Chuck Hint, MD;  Location: [facility] OR;  Service: Vascular;  Laterality: Left;   AV FISTULA PLACEMENT Left 08/09/2017   Procedure: INSERTION OF ARTERIOVENOUS (AV) GORE-TEX GRAFT LEFT ARM;  Surgeon: Sherren Kerns, MD;  Location: [facility] OR;  Service: Vascular;  Laterality: Left;   AV FISTULA PLACEMENT Right 03/15/2020   Procedure: RIGHT ARM ARTERIOVENOUS (AV) FISTULA CREATION 1st Stage Basilic Transposition;  Surgeon: Nada Libman, MD;  Location: [facility] OR;  Service: Vascular;  Laterality: Right;   AV FISTULA PLACEMENT Right 02/24/2021   Procedure: RIGHT ARM ARTERIOVENOUS  FISTULA CREATION;  Surgeon: Victorino Sparrow, MD;  [facility] OR;  Service: Vascular;  Laterality: Right;  regional block   AVGG REMOVAL Left 08/11/2017   Procedure: REMOVAL OF ARTERIOVENOUS GORETEX GRAFT (AVGG);  Surgeon: Nada Libman, MD;  Location: [facility] OR;  Service: Vascular;  Laterality: Left;   BASCILIC VEIN TRANSPOSITION Right 05/17/2020   Procedure: RIGHT UPPER EXTREMITY SECOND STAGE BASCILIC VEIN TRANSPOSITION;  Surgeon: Nada Libman, MD;  Location: [facility] OR;  Service: Vascular;  Laterality: Right;   BIOPSY  09/03/2017   Procedure: BIOPSY;  Surgeon: Kathi Der, MD;  Location: [facility] ENDOSCOPY;  Service: Gastroenterology;;   BIOPSY  12/24/2017   Procedure: BIOPSY;  Surgeon: Lemar Lofty., MD;  Location: [facility] ENDOSCOPY;  Service: Gastroenterology;;   CARDIAC CATHETERIZATION  2000's   COLONOSCOPY WITH PROPOFOL N/A 09/04/2017   Procedure: COLONOSCOPY WITH PROPOFOL;  Surgeon: Kerin Salen, MD;  Location: [facility] ENDOSCOPY;  Service: Gastroenterology;  Laterality: N/A;   ENTEROSCOPY N/A 12/24/2017   Procedure: ENTEROSCOPY;  Surgeon: Meridee Score Netty Starring., MD;  Location: [facility] ENDOSCOPY;  Service: Gastroenterology;  Laterality: N/A;   ESOPHAGOGASTRODUODENOSCOPY     ESOPHAGOGASTRODUODENOSCOPY (EGD) WITH PROPOFOL Left 06/04/2017   Procedure: ESOPHAGOGASTRODUODENOSCOPY (EGD)  WITH PROPOFOL;  Surgeon: Willis Modena, MD;  Location: [facility] ENDOSCOPY;  Service: Endoscopy;  Laterality: Left;   ESOPHAGOGASTRODUODENOSCOPY (EGD) WITH PROPOFOL N/A 09/03/2017   Procedure: ESOPHAGOGASTRODUODENOSCOPY (EGD) WITH PROPOFOL;  Surgeon: Kathi Der, MD;  Location: [facility] ENDOSCOPY;  Service: Gastroenterology;  Laterality: N/A;   ESOPHAGOGASTRODUODENOSCOPY (EGD) WITH PROPOFOL N/A 04/27/2022   Procedure: ESOPHAGOGASTRODUODENOSCOPY (EGD) WITH PROPOFOL;  Surgeon: Regis Bill, MD;  Location: [facility] ENDOSCOPY;  Service: Endoscopy;  Laterality: N/A;   FISTULA SUPERFICIALIZATION Left 04/02/2017   Procedure: FISTULA SUPERFICIALIZATION LEFT ARM;  Surgeon: Nada Libman, MD;  Location: [facility] OR;  Service: Vascular;  Laterality: Left;   FISTULA SUPERFICIALIZATION Right 04/14/2021   Procedure: RIGHT ARM FISTULA SUPERFICIALIZATION;  Surgeon: Victorino Sparrow, MD;  Location: [facility] OR;  Service: Vascular;  Laterality: Right;  PERIPHERAL NERVE BLOCK   FISTULOGRAM Left 04/07/2016   Procedure: FISTULOGRAM;  Surgeon: Maeola Harman, MD;  Location: [facility] OR;  Service: Vascular;  Laterality: Left;   FRACTURE SURGERY     right ankle   GIVENS CAPSULE STUDY N/A 10/29/2017   Procedure: GIVENS CAPSULE STUDY;  Surgeon: Charlott Rakes, MD;  Location: [facility] ENDOSCOPY;  Service: Endoscopy;  Laterality: N/A;   GIVENS CAPSULE STUDY N/A 12/24/2017   Procedure: GIVENS CAPSULE STUDY;  Surgeon: Meridee Score Netty Starring., MD;  Location: [facility]  ENDOSCOPY;  Service: Gastroenterology;  Laterality: N/A;   GIVENS CAPSULE STUDY N/A 05/22/2018   Procedure: GIVENS CAPSULE STUDY;  Surgeon: Lemar Lofty., MD;  Location: [facility] ENDOSCOPY;  Service: Gastroenterology;  Laterality: N/A;   INSERTION OF DIALYSIS CATHETER Right 04/07/2016   Procedure: INSERTION OF DIALYSIS CATHETER;  Surgeon: Maeola Harman, MD;  Location: [facility] OR;  Service: Vascular;  Laterality: Right;   INSERTION OF DIALYSIS CATHETER Right 04/04/2017    Procedure: INSERTION OF DIALYSIS CATHETER;  Surgeon: Maeola Harman, MD;  Location: [facility] OR;  Service: Vascular;  Laterality: Right;   IR FLUORO GUIDE CV LINE LEFT  03/13/2020   IR US GUIDE VASC ACCESS LEFT  03/13/2020   LEFT HEART CATH AND CORONARY ANGIOGRAPHY N/A 05/12/2019   Procedure: LEFT HEART CATH AND CORONARY ANGIOGRAPHY;  Surgeon: Orpah Cobb, MD;  Location: [facility] INVASIVE CV LAB;  Service: Cardiovascular;  Laterality: N/A;   LIGATION OF ARTERIOVENOUS  FISTULA Left 04/14/2016   Procedure: LIGATION OF ARTERIOVENOUS  FISTULA;  Surgeon: Chuck Hint, MD;  Location: [facility] OR;  Service: Vascular;  Laterality: Left;   LIGATION OF COMPETING BRANCHES OF ARTERIOVENOUS FISTULA Right 05/17/2020   Procedure: LIGATION OF COMPETING BRANCHES OF RIGHT ARM ARTERIOVENOUS FISTULA;  Surgeon: Nada Libman, MD;  Location: [facility] OR;  Service: Vascular;  Laterality: Right;   PATCH ANGIOPLASTY Left 06/19/2016   Procedure: PATCH ANGIOPLASTY LEFT ARTERIOVENOUS FISTULA;  Surgeon: Chuck Hint, MD;  Location: [facility] OR;  Service: Vascular;  Laterality: Left;   POLYPECTOMY  09/04/2017   Procedure: POLYPECTOMY;  Surgeon: Kerin Salen, MD;  Location: [facility] ENDOSCOPY;  Service: Gastroenterology;;   REVISON OF ARTERIOVENOUS FISTULA Left 06/19/2016   Procedure: REVISION SUPERFICIALIZATION OF BRACHIOCEPHALIC ARTERIOVENOUS FISTULA;  Surgeon: Chuck Hint, MD;  Location: [facility] OR;  Service: Vascular;  Laterality: Left;   UPPER EXTREMITY VENOGRAPHY Bilateral 02/19/2021   Procedure: UPPER EXTREMITY VENOGRAPHY;  Surgeon: Victorino Sparrow, MD;  Location: [facility] INVASIVE CV LAB;  Service: Cardiovascular;  Laterality: Bilateral;    Family History  Problem Relation Age of Onset   Hypertension Mother    Thyroid disease Mother    Coronary artery disease Father    Hypertension Father    Diabetes Father    Colon cancer Maternal Aunt    Esophageal cancer Maternal Uncle    Inflammatory bowel disease Neg Hx    Liver  disease Neg Hx    Pancreatic cancer Neg Hx    Rectal cancer Neg Hx    Stomach cancer Neg Hx      Social History   Tobacco Use   Smoking status: Never   Smokeless tobacco: Never  Vaping Use   Vaping status: Never Used  Substance Use Topics   Alcohol use: No   Drug use: Never    Prior to Admission medications   Medication Sig Start Date End Date Taking? Authorizing Provider  acetaminophen (TYLENOL) 500 MG tablet Take 1,000 mg by mouth every 6 (six) hours as needed for moderate pain.   Yes [provider]  allopurinol (ZYLOPRIM) 100 MG tablet Take 100 mg by mouth at bedtime.    Yes [provider]  ALPRAZolam Prudy Feeler) 1 MG tablet Take 1 mg by mouth 3 (three) times daily as needed for anxiety or sleep.   Yes [provider]  amLODipine (NORVASC) 10 MG tablet Take 1 tablet (10 mg total) by mouth daily. 07/25/22  Yes Wouk, Wilfred Curtis, MD  ascorbic acid (VITAMIN C) 500 MG tablet Take 1 tablet (500  mg total) by mouth 2 (two) times daily. 08/13/22  Yes Arnetha Courser, MD  carbidopa-levodopa (SINEMET IR) 25-100 MG tablet TAKE 1 TABLET BY MOUTH THREE TIMES DAILY 10/06/22  Yes Levert Feinstein, MD  carvedilol (COREG) 25 MG tablet Take 1 tablet (25 mg total) by mouth 2 (two) times daily with a meal. 07/24/22  Yes Wouk, Wilfred Curtis, MD  Diclofenac Sodium 1 % CREA 1 gram qid prn 05/26/21  Yes Levert Feinstein, MD  DULoxetine (CYMBALTA) 30 MG capsule Take 1 capsule (30 mg total) by mouth daily. 05/06/22  Yes Levert Feinstein, MD  eszopiclone (LUNESTA) 1 MG TABS tablet Take 1 mg by mouth at bedtime. 09/17/22  Yes [provider]  gabapentin (NEURONTIN) 100 MG capsule TAKE 3 CAPSULES(300 MG) BY MOUTH THREE TIMES DAILY 10/08/22  Yes Levert Feinstein, MD  gentamicin cream (GARAMYCIN) 0.1 % Apply 1 Application topically 2 (two) times daily. 09/15/22  Yes Felecia Shelling, DPM  hydrALAZINE (APRESOLINE) 25 MG tablet Take 3 tablets (75 mg total) by mouth every 8 (eight) hours. 08/13/22  Yes Arnetha Courser, MD   hydrocortisone (ANUSOL-HC) 2.5 % rectal cream Place 1 Application rectally 2 (two) times daily. 07/01/22  Yes Pilar Jarvis, MD  hydrOXYzine (ATARAX/VISTARIL) 25 MG tablet Take 25 mg by mouth every 6 (six) hours as needed for itching.   Yes [provider]  isosorbide mononitrate (IMDUR) 60 MG 24 hr tablet Take 1 tablet (60 mg total) by mouth every evening. Skip the dose if systolic BP less than 140 mmHg 05/13/22 05/13/23 Yes Gillis Santa, MD  lanthanum (FOSRENOL) 1000 MG chewable tablet Chew 1,000-2,000 mg by mouth See admin instructions. Take 2 tablets (2000mg ) by mouth three times daily with meals and take 1 tablet (1000mg ) by mouth twice daily with snacks   Yes [provider]  lidocaine-prilocaine (EMLA) cream 1gram tid prn 05/26/21  Yes Levert Feinstein, MD  multivitamin (RENA-VIT) TABS tablet Take 1 tablet by mouth at bedtime.    Yes [provider]  omeprazole (PRILOSEC) 40 MG capsule Take 1 capsule (40 mg total) by mouth daily. Patient taking differently: Take 40 mg by mouth 2 (two) times daily. 08/13/22  Yes Arnetha Courser, MD  polyethylene glycol (MIRALAX) 17 g packet Take 17 g by mouth daily. Patient taking differently: Take 17 g by mouth daily as needed for mild constipation or moderate constipation. 07/01/22  Yes Pilar Jarvis, MD  promethazine (PHENERGAN) 25 MG tablet Take 25 mg by mouth 3 (three) times daily as needed for nausea or vomiting.   Yes [provider]  rOPINIRole (REQUIP) 1 MG tablet Take 1 tablet (1 mg total) by mouth at bedtime. 04/29/22  Yes Levert Feinstein, MD  Vilazodone HCl (VIIBRYD) 10 MG TABS Take 10 mg by mouth daily.   Yes [provider]  zinc sulfate 220 (50 Zn) MG capsule Take 1 capsule (220 mg total) by mouth daily. 08/13/22  Yes Arnetha Courser, MD  benzonatate (TESSALON) 200 MG capsule Take 1 capsule (200 mg total) by mouth 3 (three) times daily as needed for cough. Patient not taking: Reported on 10/20/2022 08/13/22   Arnetha Courser, MD   chlorpheniramine-HYDROcodone (TUSSIONEX) 10-8 MG/5ML Take 5 mLs by mouth every 12 (twelve) hours as needed for cough. Patient not taking: Reported on 10/20/2022 08/13/22   Arnetha Courser, MD  Multiple Vitamin (MULTI-VITAMIN) tablet Take 1 tablet by mouth daily. Patient not taking: Reported on 10/20/2022    [provider]  pregabalin (LYRICA) 75 MG capsule Take 75 mg  by mouth at bedtime. Patient not taking: Reported on 10/20/2022 08/13/22   [provider]    Current Facility-Administered Medications  Medication Dose Route Frequency Provider Last Rate Last Admin   acetaminophen (TYLENOL) tablet 650 mg  650 mg Oral Q6H PRN Cox, Amy N, DO       Or   acetaminophen (TYLENOL) suppository 650 mg  650 mg Rectal Q6H PRN Cox, Amy N, DO       allopurinol (ZYLOPRIM) tablet 100 mg  100 mg Oral QHS Cox, Amy N, DO   100 mg at 10/20/22 0053   ALPRAZolam (XANAX) tablet 1 mg  1 mg Oral TID PRN Cox, Amy N, DO       amLODipine (NORVASC) tablet 10 mg  10 mg Oral Daily Cox, Amy N, DO   10 mg at 10/20/22 1016   ascorbic acid (VITAMIN C) tablet 500 mg  500 mg Oral BID Cox, Amy N, DO   500 mg at 10/20/22 1016   carbidopa-levodopa (SINEMET IR) 25-100 MG per tablet immediate release 1 tablet  1 tablet Oral TID Cox, Amy N, DO   1 tablet at 10/20/22 1016   carvedilol (COREG) tablet 25 mg  25 mg Oral BID WC Cox, Amy N, DO   25 mg at 10/20/22 0800   chlorpheniramine-HYDROcodone (TUSSIONEX) 10-8 MG/5ML suspension 5 mL  5 mL Oral Q12H PRN Cox, Amy N, DO       DULoxetine (CYMBALTA) DR capsule 30 mg  30 mg Oral Daily Cox, Amy N, DO   30 mg at 10/20/22 1016   gabapentin (NEURONTIN) capsule 300 mg  300 mg Oral TID Cox, Amy N, DO   300 mg at 10/20/22 1016   hydrALAZINE (APRESOLINE) tablet 10 mg  10 mg Oral Q6H PRN Cox, Amy N, DO       hydrALAZINE (APRESOLINE) tablet 75 mg  75 mg Oral Q8H Cox, Amy N, DO   75 mg at 10/20/22 1610   hydrOXYzine (ATARAX) tablet 25 mg  25 mg Oral Q6H PRN Cox, Amy N, DO   25 mg at  10/20/22 0103   isosorbide mononitrate (IMDUR) 24 hr tablet 60 mg  60 mg Oral QPM Cox, Amy N, DO       lanthanum (FOSRENOL) chewable tablet 2,000 mg  2,000 mg Oral TID WC Otelia Sergeant, RPH   2,000 mg at 10/20/22 9604   And   lanthanum (FOSRENOL) chewable tablet 1,000 mg  1,000 mg Oral PRN Otelia Sergeant, RPH       multivitamin (RENA-VIT) tablet 1 tablet  1 tablet Oral QHS Cox, Amy N, DO       pantoprazole (PROTONIX) EC tablet 80 mg  80 mg Oral BID Cox, Amy N, DO   80 mg at 10/20/22 1016   polyethylene glycol (MIRALAX / GLYCOLAX) packet 17 g  17 g Oral Daily PRN Cox, Amy N, DO       promethazine (PHENERGAN) 25 mg in sodium chloride 0.9 % 50 mL IVPB  25 mg Intravenous Q6H PRN Cox, Amy N, DO       promethazine (PHENERGAN) tablet 25 mg  25 mg Oral TID PRN Cox, Amy N, DO   25 mg at 10/20/22 0759   rOPINIRole (REQUIP) tablet 1 mg  1 mg Oral QHS Cox, Amy N, DO   1 mg at 10/20/22 0133   traZODone (DESYREL) tablet 50 mg  50 mg Oral QHS PRN Cox, Amy N, DO  Allergies as of 10/19/2022 - Review Complete 10/19/2022  Allergen Reaction Noted   Nsaids Other (See Comments) 01/27/2013   Tolmetin Other (See Comments) 07/20/2015     Review of Systems:    All systems reviewed and negative except where noted in HPI.      Physical Exam:  Vital signs in last 24 hours: Temp:  [97.9 F (36.6 C)-98.4 F (36.9 C)] 98.3 F (36.8 C) (10/15 1235) Pulse Rate:  [81-104] 83 (10/15 1235) Resp:  [11-23] 18 (10/15 1235) BP: (129-181)/(68-95) 129/85 (10/15 1235) SpO2:  [89 %-100 %] 99 % (10/15 1235) Weight:  [108.1 kg] 108.1 kg (10/14 1826) Last BM Date : 10/19/22 General:   Pleasant woman in NAD Head:  Normocephalic and atraumatic. Eyes:   No icterus.   Conjunctiva pink. Ears:  Normal auditory acuity. Mouth: Mucosa pink moist, no lesions. Neck:  Supple; no masses felt Lungs:  Respirations even and unlabored. Lungs clear to auscultation bilaterally.   No wheezes, crackles, or rhonchi.  Heart:  S1S2,  RRR, no MRG. No edema. Abdomen:   Flat, soft, nondistended, diffusely tender moreso in the suprapubic area. Normal bowel sounds. No appreciable masses or hepatomegaly. No rebound signs or other peritoneal signs. Rectal:  Not performed.  Msk:  MAEW x4, No clubbing or cyanosis. Strength 5/5. Symmetrical without gross deformities. Neurologic:  Alert and  oriented x4;  Cranial nerves II-XII intact.  Skin:  Warm, dry, pink without significant lesions or rashes. Psych:  Alert and cooperative. Normal affect.  LAB RESULTS: Recent Labs    10/19/22 1850 10/20/22 0606  WBC 5.4 5.3  HGB 8.1* 7.5*  HCT 26.6* 24.7*  PLT 228 237   BMET Recent Labs    10/19/22 1850 10/20/22 0606  NA 137 135  K 4.2 4.9  CL 93* 92*  CO2 29 28  GLUCOSE 115* 104*  BUN 39* 46*  CREATININE 5.78* 6.66*  CALCIUM 8.2* 8.3*   LFT Recent Labs    10/19/22 1850  PROT 7.6  ALBUMIN 3.6  AST 19  ALT 8  ALKPHOS 83  BILITOT 0.6   PT/INR Recent Labs    10/19/22 2049  LABPROT 15.0  INR 1.2    STUDIES: CT ABDOMEN PELVIS WO CONTRAST  Result Date: 10/20/2022 CLINICAL DATA:  Rectal bleeding EXAM: CT ABDOMEN AND PELVIS WITHOUT CONTRAST TECHNIQUE: Multidetector CT imaging of the abdomen and pelvis was performed following the standard protocol without IV contrast. RADIATION DOSE REDUCTION: This exam was performed according to the departmental dose-optimization program which includes automated exposure control, adjustment of the mA and/or kV according to patient size and/or use of iterative reconstruction technique. COMPARISON:  CT 02/14/2020 FINDINGS: Lower chest: Lung bases demonstrate no acute airspace disease. Hepatobiliary: Small gallstones. No biliary dilatation. No focal hepatic abnormality prior Pancreas: Unremarkable. No pancreatic ductal dilatation or surrounding inflammatory changes. Spleen: Normal in size without focal abnormality. Adrenals/Urinary Tract: Adrenal glands are normal. Atrophic kidneys without  hydronephrosis. Cysts and subcentimeter hypodensities too small to further characterize, no imaging follow-up is recommended. Multiple kidney stones, measuring up to 5 mm on the left and 6 mm on the right. The bladder is unremarkable Stomach/Bowel: The stomach is nonenlarged. No dilated small bowel. 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 Question fistulous communication between the rectum and sigmoid colon, series 2 image 73 Vascular/Lymphatic: Moderate aortic atherosclerosis. No aneurysm. No suspicious lymph nodes Reproductive: Status post hysterectomy. No adnexal masses. Other: Negative for pelvic effusion or free air Musculoskeletal: No  acute or suspicious osseous abnormality IMPRESSION: 1. 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 Question fistulous communication between the rectum and sigmoid colon. 2. Cholelithiasis. 3. Atrophic kidneys with bilateral kidney stones. 4. Aortic atherosclerosis. Aortic Atherosclerosis (ICD10-I70.0). Electronically Signed   By: Jasmine Pang M.D.   On: 10/20/2022 00:02       Impression / Plan:    Rectal bleeding- broad ddx- has history of distal/mid avms, colon polyps, anf congested friable colon on prior GI studies. Unsure this is diverticular given her pain/tenderness. Recommending colonoscopy for luminal evaluation - this was discussed with patient and she is agreeable. CLiq diet for now. Would continue to follow hgn and recommend transfusing to keep hgb >7. Note pt/inr normal.  Thank you very much for this consult. These services were provided by Vevelyn Pat, NP-C, in collaboration with Regis Bill, MD, with whom I have discussed this patient in full.   Vevelyn Pat, NP-C

## 2022-10-20 NOTE — Plan of Care (Signed)

## 2022-10-20 NOTE — Progress Notes (Signed)
[facility] Progress Note  Patient: Patricia [name]    QMV:[id number]  DOA: 10/19/2022     Date of Service: the patient was seen and examined on 10/20/2022  Chief Complaint  Patient presents with   Rectal Bleeding   Brief hospital course: Patricia [name] is a 47 year old female with history of hypertension, depression, anxiety, restless leg syndrome, GERD, end-stage renal disease on hemodialysis, gastroparesis, who presents emergency department for chief concerns of rectal bleed.   Vitals in the ED showed temperature of 97.9, respiration rate of 18, heart rate of 99, blood pressure 142/95, SpO2 100% on room air.   Serum sodium is 137, potassium 4.2, chloride 93, bicarb 29, BUN of 39, serum creatinine of 5.78, EGFR of 8, WBC 5.4, hemoglobin 8.1, platelets of 228.   CT abdomen pelvis without contrast: 1. 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 Question fistulous communication between the rectum and sigmoid colon. 2. Cholelithiasis. 3. Atrophic kidneys with bilateral kidney stones. 4. Aortic atherosclerosis.  Assessment and Plan:  Acute on chronic anemia due to lower GI bleeding Suspect secondary to diverticular bleed Baseline hemoglobin is 7.5-9.0 Patient's hemoglobin is within range, no indication for transfusion at this time Check CBC in a.m., goal Hgb > 7.5 GI consulted, started on clear liquid diet, patient agreed for colonoscopy    ESRD (end stage renal disease) (HCC) Home lanthanum 1000 to 2000 mg p.o. resume home dosing Nephrology consulted for dialysis, MWF schedule   Hypertension Amlodipine 10 mg daily, Coreg 25 mg p.o. twice daily with meals, hydralazine 75 mg every 8 hours, Imdur 60 mg every evening resumed Hydralazine 10 mg p.o. every 6 hours as needed for SBP greater than 175, 4 days ordered   Type 2 diabetes mellitus with other diabetic kidney complication (HCC) Not on home diabetic  medication, presumed secondary to end-stage renal disease Patient had A1c of 4.7 on 08/24/2022 Insulin SSI with agents coverage not ordered on admission due to needlestick sparing for patient A.m. can order insulin SSI if patient's blood glucose continues to elevate   Morbid obesity (HCC) This complicates overall care and prognosis.    Restless leg syndrome Home ropinirole 1 mg nightly resumed   Neuropathic pain Home gabapentin 300 mg 3 times daily resumed   Body mass index is 37.33 kg/m.  Interventions:  Diet: CLD  DVT Prophylaxis: SCD, pharmacological prophylaxis contraindicated due to Rectal bleeding    Advance goals of care discussion: Full code  Family Communication: family was not present at bedside, at the time of interview.  The pt provided permission to discuss medical plan with the family. Opportunity was given to ask question and all questions were answered satisfactorily.   Disposition:  Pt is from home, admitted with rectal bleeding, still has low hemoglobin, may need colonoscopy, which precludes a safe discharge. Discharge to home, when cleared by GI.  Subjective: No significant events overnight, patient still complaining of generalized abdominal pain, no rectal bleeding today morning.  Denied any chest pain or palpitation no shortness of breath.  Physical Exam: General: NAD, lying comfortably Appear in no distress, affect appropriate Eyes: PERRLA ENT: Oral Mucosa Clear, moist  Neck: no JVD,  Cardiovascular: S1 and S2 Present, no Murmur,  Respiratory: good respiratory effort, Bilateral Air entry equal and Decreased, no Crackles, no wheezes Abdomen: Bowel Sound present, Soft and mild generalized abd tenderness,  Skin: no rashes Extremities: no Pedal edema, no calf tenderness Neurologic: without any new focal findings Gait not  checked due to patient safety concerns  Vitals:   10/20/22 1016 10/20/22 1020 10/20/22 1235 10/20/22 1540  BP: (!) 150/82 (!) 150/82  129/85 (!) 142/75  Pulse:  81 83 79  Resp:  20 18 19   Temp:   98.3 F (36.8 C) 98.5 F (36.9 C)  TempSrc:    Oral  SpO2:  95% 99% 100%  Weight:      Height:       No intake or output data in the 24 hours ending 10/20/22 1545 Filed Weights   10/19/22 1826  Weight: 108.1 kg    Data Reviewed: I have personally reviewed and interpreted daily labs, tele strips, imagings as discussed above. I reviewed all nursing notes, pharmacy notes, vitals, pertinent old records I have discussed plan of care as described above with RN and patient/family.  CBC: Recent Labs  Lab 10/14/22 1232 10/19/22 1850 10/20/22 0606 10/20/22 1351  WBC 7.5 5.4 5.3  --   NEUTROABS 5.2  --   --   --   HGB 9.0* 8.1* 7.5* 7.5*  HCT 31.0* 26.6* 24.7* 24.2*  MCV 84.0 81.3 79.9*  --   PLT 353 228 237  --    Basic Metabolic Panel: Recent Labs  Lab 10/14/22 1232 10/19/22 1850 10/20/22 0606  NA 134* 137 135  K 3.8 4.2 4.9  CL 92* 93* 92*  CO2 26 29 28   GLUCOSE 86 115* 104*  BUN 20 39* 46*  CREATININE 4.45* 5.78* 6.66*  CALCIUM 8.4* 8.2* 8.3*    Studies: CT ABDOMEN PELVIS WO CONTRAST  Result Date: 10/20/2022 CLINICAL DATA:  Rectal bleeding EXAM: CT ABDOMEN AND PELVIS WITHOUT CONTRAST TECHNIQUE: Multidetector CT imaging of the abdomen and pelvis was performed following the standard protocol without IV contrast. RADIATION DOSE REDUCTION: This exam was performed according to the departmental dose-optimization program which includes automated exposure control, adjustment of the mA and/or kV according to patient size and/or use of iterative reconstruction technique. COMPARISON:  CT 02/14/2020 FINDINGS: Lower chest: Lung bases demonstrate no acute airspace disease. Hepatobiliary: Small gallstones. No biliary dilatation. No focal hepatic abnormality prior Pancreas: Unremarkable. No pancreatic ductal dilatation or surrounding inflammatory changes. Spleen: Normal in size without focal abnormality. Adrenals/Urinary  Tract: Adrenal glands are normal. Atrophic kidneys without hydronephrosis. Cysts and subcentimeter hypodensities too small to further characterize, no imaging follow-up is recommended. Multiple kidney stones, measuring up to 5 mm on the left and 6 mm on the right. The bladder is unremarkable Stomach/Bowel: The stomach is nonenlarged. No dilated small bowel. 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 Question fistulous communication between the rectum and sigmoid colon, series 2 image 75 Vascular/Lymphatic: Moderate aortic atherosclerosis. No aneurysm. No suspicious lymph nodes Reproductive: Status post hysterectomy. No adnexal masses. Other: Negative for pelvic effusion or free air Musculoskeletal: No acute or suspicious osseous abnormality IMPRESSION: 1. 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 Question fistulous communication between the rectum and sigmoid colon. 2. Cholelithiasis. 3. Atrophic kidneys with bilateral kidney stones. 4. Aortic atherosclerosis. Aortic Atherosclerosis (ICD10-I70.0). Electronically Signed   By: Jasmine Pang M.D.   On: 10/20/2022 00:02    Scheduled Meds:  allopurinol  100 mg Oral QHS   amLODipine  10 mg Oral Daily   ascorbic acid  500 mg Oral BID   carbidopa-levodopa  1 tablet Oral TID   carvedilol  25 mg Oral BID WC   DULoxetine  30 mg Oral Daily   gabapentin  300 mg Oral TID   hydrALAZINE  75 mg Oral Q8H   [START ON 10/21/2022] influenza vac split trivalent PF  0.5 mL Intramuscular Tomorrow-1000   isosorbide mononitrate  60 mg Oral QPM   lanthanum  2,000 mg Oral TID WC   multivitamin  1 tablet Oral QHS   pantoprazole  80 mg Oral BID   polyethylene glycol-electrolytes  4,000 mL Oral Once   rOPINIRole  1 mg Oral QHS   Continuous Infusions:  promethazine (PHENERGAN) injection (IM or IVPB)     PRN Meds: acetaminophen **OR** acetaminophen, ALPRAZolam,  chlorpheniramine-HYDROcodone, hydrALAZINE, HYDROmorphone (DILAUDID) injection, hydrOXYzine, lanthanum **AND** lanthanum, polyethylene glycol, promethazine (PHENERGAN) injection (IM or IVPB), promethazine, traZODone  Time spent: 55 minutes  Author: Gillis Santa. MD [facility] 10/20/2022 3:45 PM  To reach On-call, see care teams to locate the attending and reach out to them via [email or web address]. If 7PM-7AM, please contact night-coverage If you still have difficulty reaching the attending provider, please page the [facility] (Director on Call) for [facility] on amion for assistance.

## 2022-10-20 NOTE — Progress Notes (Signed)
[facility]  ROUNDING NOTE   Subjective:   Patricia [name] is a 47 year old female with past medical history of hypertension, coronary artery disease, congestive heart failure, hyperlipidemia, end-stage renal disease on hemodialysis via right AV fistula now comes to the emergency room with rectal bleeding. She has been admitted for Rectal bleeding [K62.5] Lower GI bleed [K92.2] Acute on chronic anemia [D64.9]   Patient is known to our practice from previous admissions and receives outpatient dialysis treatments at [facility] on a MWF schedule, supervised by [facility] physicians).  Patient received a full dialysis treatment on Monday prior to ED arrival.  Patient states she has been having rectal bleeding since last week.  Reports as dark red blood with clots.  Has had colonoscopy in the past, states next is scheduled for November.  Does endorse history of hemorrhoids.  Denies abdominal pain or discomfort.  Aside from rectal bleeding, complains of generalized pruritus.  Labs on ED arrival include glucose 115, BUN 39, creatinine 5.78 with GFR 8, calcium 8.2, and hemoglobin 8.1.  CT abdomen pelvis shows diverticular disease without inflammatory process, cholelithiasis, and atrophic kidneys with bilateral kidney stones.  We have been consulted to manage dialysis needs during this admission.   Objective:  Vital signs in last 24 hours:  Temp:  [97.9 F (36.6 C)-98.4 F (36.9 C)] 98.3 F (36.8 C) (10/15 1235) Pulse Rate:  [81-104] 83 (10/15 1235) Resp:  [11-23] 18 (10/15 1235) BP: (129-181)/(68-95) 129/85 (10/15 1235) SpO2:  [89 %-100 %] 99 % (10/15 1235) Weight:  [108.1 kg] 108.1 kg (10/14 1826)  Weight change:  Filed Weights   10/19/22 1826  Weight: 108.1 kg    Intake/Output: No intake/output data recorded.   Intake/Output this shift:  No intake/output data recorded.  Physical Exam: General: NAD, resting comfortably  Head:  Normocephalic, atraumatic. Moist oral mucosal membranes  Eyes: Anicteric  Lungs:  Clear to auscultation, normal effort  Heart: Regular rate and rhythm  Abdomen:  Soft, nontender, obese  Extremities: No peripheral edema.  Neurologic: Alert and oriented, moving all four extremities  Skin: No lesions  Access: Right AVF    Basic Metabolic Panel: Recent Labs  Lab 10/14/22 1232 10/19/22 1850 10/20/22 0606  NA 134* 137 135  K 3.8 4.2 4.9  CL 92* 93* 92*  CO2 26 29 28   GLUCOSE 86 115* 104*  BUN 20 39* 46*  CREATININE 4.45* 5.78* 6.66*  CALCIUM 8.4* 8.2* 8.3*    Liver Function Tests: Recent Labs  Lab 10/19/22 1850  AST 19  ALT 8  ALKPHOS 83  BILITOT 0.6  PROT 7.6  ALBUMIN 3.6   Recent Labs  Lab 10/19/22 1850  LIPASE 39   No results for input(s): "AMMONIA" in the last 168 hours.  CBC: Recent Labs  Lab 10/14/22 1232 10/19/22 1850 10/20/22 0606 10/20/22 1351  WBC 7.5 5.4 5.3  --   NEUTROABS 5.2  --   --   --   HGB 9.0* 8.1* 7.5* 7.5*  HCT 31.0* 26.6* 24.7* 24.2*  MCV 84.0 81.3 79.9*  --   PLT 353 228 237  --     Cardiac Enzymes: No results for input(s): "CKTOTAL", "CKMB", "CKMBINDEX", "TROPONINI" in the last 168 hours.  BNP: Invalid input(s): "POCBNP"  CBG: No results for input(s): "GLUCAP" in the last 168 hours.  Microbiology: Results for orders placed or performed during the hospital encounter of 10/19/22  MRSA Next Gen by PCR, Nasal     Status: None  Collection Time: 10/20/22  8:32 AM   Specimen: Nasal Mucosa; Nasal Swab  Result Value Ref Range Status   MRSA by PCR Next Gen NOT DETECTED NOT DETECTED Final    Comment: (NOTE) The GeneXpert MRSA Assay (FDA approved for NASAL specimens only), is one component of a comprehensive MRSA colonization surveillance program. It is not intended to diagnose MRSA infection nor to guide or monitor treatment for MRSA infections. Test performance is not FDA approved in patients less than 4 years old. Performed  at [facility], [address] [place], [place] [address]     Coagulation Studies: Recent Labs    10/19/22 2047-11-18  LABPROT 15.0  INR 1.2    Urinalysis: No results for input(s): "COLORURINE", "LABSPEC", "PHURINE", "GLUCOSEU", "HGBUR", "BILIRUBINUR", "KETONESUR", "PROTEINUR", "UROBILINOGEN", "NITRITE", "LEUKOCYTESUR" in the last 72 hours.  Invalid input(s): "APPERANCEUR"    Imaging: CT ABDOMEN PELVIS WO CONTRAST  Result Date: 10/20/2022 CLINICAL DATA:  Rectal bleeding EXAM: CT ABDOMEN AND PELVIS WITHOUT CONTRAST TECHNIQUE: Multidetector CT imaging of the abdomen and pelvis was performed following the standard protocol without IV contrast. RADIATION DOSE REDUCTION: This exam was performed according to the departmental dose-optimization program which includes automated exposure control, adjustment of the mA and/or kV according to patient size and/or use of iterative reconstruction technique. COMPARISON:  CT 02/14/2020 FINDINGS: Lower chest: Lung bases demonstrate no acute airspace disease. Hepatobiliary: Small gallstones. No biliary dilatation. No focal hepatic abnormality prior Pancreas: Unremarkable. No pancreatic ductal dilatation or surrounding inflammatory changes. Spleen: Normal in size without focal abnormality. Adrenals/Urinary Tract: Adrenal glands are normal. Atrophic kidneys without hydronephrosis. Cysts and subcentimeter hypodensities too small to further characterize, no imaging follow-up is recommended. Multiple kidney stones, measuring up to 5 mm on the left and 6 mm on the right. The bladder is unremarkable Stomach/Bowel: The stomach is nonenlarged. No dilated small bowel. 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 Question fistulous communication between the rectum and sigmoid colon, series 2 image 75 Vascular/Lymphatic: Moderate aortic atherosclerosis. No aneurysm. No suspicious lymph nodes Reproductive: Status post  hysterectomy. No adnexal masses. Other: Negative for pelvic effusion or free air Musculoskeletal: No acute or suspicious osseous abnormality IMPRESSION: 1. 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 Question fistulous communication between the rectum and sigmoid colon. 2. Cholelithiasis. 3. Atrophic kidneys with bilateral kidney stones. 4. Aortic atherosclerosis. Aortic Atherosclerosis (ICD10-I70.0). Electronically Signed   By: Jasmine Pang M.D.   On: 10/20/2022 00:02     Medications:    promethazine (PHENERGAN) injection (IM or IVPB)      allopurinol  100 mg Oral QHS   amLODipine  10 mg Oral Daily   ascorbic acid  500 mg Oral BID   carbidopa-levodopa  1 tablet Oral TID   carvedilol  25 mg Oral BID WC   DULoxetine  30 mg Oral Daily   gabapentin  300 mg Oral TID   hydrALAZINE  75 mg Oral Q8H   [START ON 10/21/2022] influenza vac split trivalent PF  0.5 mL Intramuscular Tomorrow-1000   isosorbide mononitrate  60 mg Oral QPM   lanthanum  2,000 mg Oral TID WC   multivitamin  1 tablet Oral QHS   pantoprazole  80 mg Oral BID   rOPINIRole  1 mg Oral QHS   acetaminophen **OR** acetaminophen, ALPRAZolam, chlorpheniramine-HYDROcodone, hydrALAZINE, HYDROmorphone (DILAUDID) injection, hydrOXYzine, lanthanum **AND** lanthanum, polyethylene glycol, promethazine (PHENERGAN) injection (IM or IVPB), promethazine, traZODone  Assessment/ Plan:  Ms. Leahanna  Liddy Deam is a 47 y.o.  female  with a past medical history of hypertension, coronary artery disease, congestive heart failure, hyperlipidemia, end-stage renal disease on hemodialysis via right AV fistula now comes to the emergency room with rectal bleeding and has been admitted for Rectal bleeding [K62.5] Lower GI bleed [K92.2] Acute on chronic anemia [D64.9]  [facility] [place]/MWF/right AVF  End-stage renal disease on hemodialysis.  Last treatment received on Monday.  Patient scheduled to  receive dialysis tomorrow.  2. Anemia of chronic kidney disease and acute blood loss: microcytic. Lab Results  Component Value Date   HGB 7.5 (L) 10/20/2022    Patient receives Mircera at outpatient clinic.  Chief complaint involves rectal bleeding, GI consulted.  3. Secondary Hyperparathyroidism: with outpatient labs: PTH 413, phosphorus 7.5, calcium 9.0 on 10/12/22.   Lab Results  Component Value Date   PTH 160 (H) 04/06/2016   CALCIUM 8.3 (L) 10/20/2022   CAION 0.99 (L) 02/11/2022   PHOS 5.8 (H) 08/12/2022    Will continue to monitor bone minerals during this admission.  Patient prescribed Cinacalcet and calcitriol outpatient.  4.  Hypertension with chronic kidney disease.  Home regimen includes amlodipine, carvedilol, hydralazine, and metoprolol.   LOS: 1   10/15/20242:52 PM

## 2022-10-21 DIAGNOSIS — D649 Anemia, unspecified: Secondary | ICD-10-CM | POA: Diagnosis not present

## 2022-10-21 LAB — CBC
HCT: 22.7 % — ABNORMAL LOW (ref 36.0–46.0)
Hemoglobin: 7.1 g/dL — ABNORMAL LOW (ref 12.0–15.0)
MCH: 24.2 pg — ABNORMAL LOW (ref 26.0–34.0)
MCHC: 31.3 g/dL (ref 30.0–36.0)
MCV: 77.5 fL — ABNORMAL LOW (ref 80.0–100.0)
Platelets: 234 10*3/uL (ref 150–400)
RBC: 2.93 MIL/uL — ABNORMAL LOW (ref 3.87–5.11)
RDW: 18.7 % — ABNORMAL HIGH (ref 11.5–15.5)
WBC: 5.7 10*3/uL (ref 4.0–10.5)
nRBC: 0.5 % — ABNORMAL HIGH (ref 0.0–0.2)

## 2022-10-21 LAB — BASIC METABOLIC PANEL
Anion gap: 16 — ABNORMAL HIGH (ref 5–15)
BUN: 56 mg/dL — ABNORMAL HIGH (ref 6–20)
CO2: 24 mmol/L (ref 22–32)
Calcium: 8.6 mg/dL — ABNORMAL LOW (ref 8.9–10.3)
Chloride: 91 mmol/L — ABNORMAL LOW (ref 98–111)
Creatinine, Ser: 8.56 mg/dL — ABNORMAL HIGH (ref 0.44–1.00)
GFR, Estimated: 5 mL/min — ABNORMAL LOW (ref >60)
Glucose, Bld: 101 mg/dL — ABNORMAL HIGH (ref 70–99)
Potassium: 5.8 mmol/L — ABNORMAL HIGH (ref 3.5–5.1)
Sodium: 131 mmol/L — ABNORMAL LOW (ref 135–145)

## 2022-10-21 LAB — POTASSIUM: Potassium: 3.9 mmol/L (ref 3.5–5.1)

## 2022-10-21 LAB — PHOSPHORUS: Phosphorus: 8.9 mg/dL — ABNORMAL HIGH (ref 2.5–4.6)

## 2022-10-21 LAB — MAGNESIUM: Magnesium: 2.2 mg/dL (ref 1.7–2.4)

## 2022-10-21 LAB — PREPARE RBC (CROSSMATCH)

## 2022-10-21 LAB — HEPATITIS B SURFACE ANTIGEN: Hepatitis B Surface Ag: NONREACTIVE

## 2022-10-21 MED ORDER — ALTEPLASE 2 MG IJ SOLR: 2.0000 mg | Freq: Once | INTRAMUSCULAR | Status: DC | PRN

## 2022-10-21 MED ORDER — ACETAMINOPHEN 325 MG PO TABS
ORAL_TABLET | ORAL | Status: AC
  Filled 2022-10-21: qty 2

## 2022-10-21 MED ORDER — HEPARIN SODIUM (PORCINE) 1000 UNIT/ML DIALYSIS: 25.0000 [IU]/kg | INTRAMUSCULAR | Status: DC | PRN

## 2022-10-21 MED ORDER — HEPARIN SODIUM (PORCINE) 1000 UNIT/ML DIALYSIS: 1000.0000 [IU] | INTRAMUSCULAR | Status: DC | PRN

## 2022-10-21 MED ORDER — SODIUM CHLORIDE 0.9% IV SOLUTION: Freq: Once | INTRAVENOUS | Status: AC

## 2022-10-21 MED ORDER — PEG 3350-KCL-NA BICARB-NACL 420 G PO SOLR
4000.0000 mL | Freq: Once | ORAL | Status: AC
  Administered 2022-10-21: 4000 mL via ORAL
  Filled 2022-10-21 (×2): qty 4000

## 2022-10-21 MED ORDER — PENTAFLUOROPROP-TETRAFLUOROETH EX AERO
INHALATION_SPRAY | CUTANEOUS | Status: AC
  Filled 2022-10-21: qty 30

## 2022-10-21 NOTE — Progress Notes (Signed)
[facility]  ROUNDING NOTE   Subjective:   Patricia [name] is a 47 year old female with past medical history of hypertension, coronary artery disease, congestive heart failure, hyperlipidemia, end-stage renal disease on hemodialysis via right AV fistula now comes to the emergency room with rectal bleeding. She has been admitted for Rectal bleeding [K62.5] Lower GI bleed [K92.2] Acute on chronic anemia [D64.9]   Patient is known to our practice from previous admissions and receives outpatient dialysis treatments at [facility] on a MWF schedule, [facility] physicians).  Patient received a full dialysis treatment on Monday prior to ED arrival.  Patient states she has been having rectal bleeding since last week.  Reports as dark red blood with clots.  Has had colonoscopy in the past, states next is scheduled for November.  Does endorse history of hemorrhoids.  Denies abdominal pain or discomfort.  Aside from rectal bleeding, complains of generalized pruritus.  Labs on ED arrival include glucose 115, BUN 39, creatinine 5.78 with GFR 8, calcium 8.2, and hemoglobin 8.1.  CT abdomen pelvis shows diverticular disease without inflammatory process, cholelithiasis, and atrophic kidneys with bilateral kidney stones.  Urgent dialysis this morning due to hyperkalemia.  Patient did not complete her prep because of nausea.  She states her stool is not clear yet.  Her colonoscopy has been postponed to tomorrow.   Objective:  Vital signs in last 24 hours:  Temp:  [97.8 F (36.6 C)-98.7 F (37.1 C)] 98.2 F (36.8 C) (10/16 1311) Pulse Rate:  [72-87] 82 (10/16 1311) Resp:  [13-25] 18 (10/16 1311) BP: (139-185)/(65-139) 151/72 (10/16 1311) SpO2:  [96 %-100 %] 100 % (10/16 1311) Weight:  [100 kg-101.1 kg] 100 kg (10/16 1251)  Weight change:  Filed Weights   10/19/22 1826 10/21/22 0923 10/21/22 1251  Weight: 108.1 kg 101.1 kg 100 kg     Intake/Output: I/O last 3 completed shifts: In: 27 [IV Piggyback:50] Out: -    Intake/Output this shift:  Total I/O In: -  Out: 1000 [Other:1000]  Physical Exam: General: NAD, resting comfortably  Head: Normocephalic, atraumatic. Moist oral mucosal membranes  Eyes: Anicteric  Lungs:  Clear to auscultation, normal effort  Heart: Regular rate and rhythm  Abdomen:  Soft, nontender, obese  Extremities: No peripheral edema.  Neurologic: Alert and oriented, moving all four extremities  Skin: No lesions  Access: Right AVF    Basic Metabolic Panel: Recent Labs  Lab 10/19/22 1850 10/20/22 0606 10/21/22 0508 10/21/22 1140  NA 137 135 131*  --   K 4.2 4.9 5.8* 3.9  CL 93* 92* 91*  --   CO2 29 28 24   --   GLUCOSE 115* 104* 101*  --   BUN 39* 46* 56*  --   CREATININE 5.78* 6.66* 8.56*  --   CALCIUM 8.2* 8.3* 8.6*  --   MG  --   --  2.2  --   PHOS  --   --  8.9*  --     Liver Function Tests: Recent Labs  Lab 10/19/22 1850  AST 19  ALT 8  ALKPHOS 83  BILITOT 0.6  PROT 7.6  ALBUMIN 3.6   Recent Labs  Lab 10/19/22 1850  LIPASE 39   No results for input(s): "AMMONIA" in the last 168 hours.  CBC: Recent Labs  Lab 10/19/22 1850 10/20/22 0606 10/20/22 1351 10/21/22 0508  WBC 5.4 5.3  --  5.7  HGB 8.1* 7.5* 7.5* 7.1*  HCT 26.6* 24.7* 24.2*  22.7*  MCV 81.3 79.9*  --  77.5*  PLT 228 237  --  234    Cardiac Enzymes: No results for input(s): "CKTOTAL", "CKMB", "CKMBINDEX", "TROPONINI" in the last 168 hours.  BNP: Invalid input(s): "POCBNP"  CBG: No results for input(s): "GLUCAP" in the last 168 hours.  Microbiology: Results for orders placed or performed during the hospital encounter of 10/19/22  MRSA Next Gen by PCR, Nasal     Status: None   Collection Time: 10/20/22  8:32 AM   Specimen: Nasal Mucosa; Nasal Swab  Result Value Ref Range Status   MRSA by PCR Next Gen NOT DETECTED NOT DETECTED Final    Comment: (NOTE) The GeneXpert MRSA Assay (FDA  approved for NASAL specimens only), is one component of a comprehensive MRSA colonization surveillance program. It is not intended to diagnose MRSA infection nor to guide or monitor treatment for MRSA infections. Test performance is not FDA approved in patients less than 31 years old. Performed at [facility], [address] [place], [place] [address]     Coagulation Studies: Recent Labs    10/19/22 16-Nov-2047  LABPROT 15.0  INR 1.2    Urinalysis: No results for input(s): "COLORURINE", "LABSPEC", "PHURINE", "GLUCOSEU", "HGBUR", "BILIRUBINUR", "KETONESUR", "PROTEINUR", "UROBILINOGEN", "NITRITE", "LEUKOCYTESUR" in the last 72 hours.  Invalid input(s): "APPERANCEUR"    Imaging: CT ABDOMEN PELVIS WO CONTRAST  Result Date: 10/20/2022 CLINICAL DATA:  Rectal bleeding EXAM: CT ABDOMEN AND PELVIS WITHOUT CONTRAST TECHNIQUE: Multidetector CT imaging of the abdomen and pelvis was performed following the standard protocol without IV contrast. RADIATION DOSE REDUCTION: This exam was performed according to the departmental dose-optimization program which includes automated exposure control, adjustment of the mA and/or kV according to patient size and/or use of iterative reconstruction technique. COMPARISON:  CT 02/14/2020 FINDINGS: Lower chest: Lung bases demonstrate no acute airspace disease. Hepatobiliary: Small gallstones. No biliary dilatation. No focal hepatic abnormality prior Pancreas: Unremarkable. No pancreatic ductal dilatation or surrounding inflammatory changes. Spleen: Normal in size without focal abnormality. Adrenals/Urinary Tract: Adrenal glands are normal. Atrophic kidneys without hydronephrosis. Cysts and subcentimeter hypodensities too small to further characterize, no imaging follow-up is recommended. Multiple kidney stones, measuring up to 5 mm on the left and 6 mm on the right. The bladder is unremarkable Stomach/Bowel: The stomach is nonenlarged. No dilated small bowel.  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 Question fistulous communication between the rectum and sigmoid colon, series 2 image 75 Vascular/Lymphatic: Moderate aortic atherosclerosis. No aneurysm. No suspicious lymph nodes Reproductive: Status post hysterectomy. No adnexal masses. Other: Negative for pelvic effusion or free air Musculoskeletal: No acute or suspicious osseous abnormality IMPRESSION: 1. 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 Question fistulous communication between the rectum and sigmoid colon. 2. Cholelithiasis. 3. Atrophic kidneys with bilateral kidney stones. 4. Aortic atherosclerosis. Aortic Atherosclerosis (ICD10-I70.0). Electronically Signed   By: Jasmine Pang M.D.   On: 10/20/2022 00:02     Medications:      sodium chloride   Intravenous Once   allopurinol  100 mg Oral QHS   amLODipine  10 mg Oral Daily   ascorbic acid  500 mg Oral BID   carbidopa-levodopa  1 tablet Oral TID   carvedilol  25 mg Oral BID WC   DULoxetine  30 mg Oral Daily   gabapentin  300 mg Oral TID   hydrALAZINE  75 mg Oral Q8H   influenza vac split trivalent PF  0.5 mL Intramuscular Tomorrow-1000   isosorbide mononitrate  60 mg Oral QPM   lanthanum  2,000 mg Oral TID WC   multivitamin  1 tablet Oral QHS   pantoprazole  80 mg Oral BID   polyethylene glycol-electrolytes  4,000 mL Oral Once   rOPINIRole  1 mg Oral QHS   acetaminophen **OR** acetaminophen, ALPRAZolam, alteplase, chlorpheniramine-HYDROcodone, heparin, heparin, heparin, hydrALAZINE, HYDROmorphone (DILAUDID) injection, hydrOXYzine, lanthanum **AND** lanthanum, lidocaine-prilocaine, pentafluoroprop-tetrafluoroeth, polyethylene glycol, promethazine, traZODone  Assessment/ Plan:  [name] is a 47 y.o.  female  with a past medical history of hypertension, coronary artery disease, congestive heart failure,  hyperlipidemia, end-stage renal disease on hemodialysis via right AV fistula now comes to the emergency room with rectal bleeding and has been admitted for Rectal bleeding [K62.5] Lower GI bleed [K92.2] Acute on chronic anemia [D64.9]  [facility]/MWF/right AVF  End-stage renal disease on hemodialysis with hyperkalemia.  -Hemodialysis completed today.  Potassium corrected to 3.9.  2. Anemia of chronic kidney disease and acute blood loss: microcytic. Lab Results  Component Value Date   HGB 7.1 (L) 10/21/2022    Patient receives Mircera at outpatient clinic.  Chief complaint involves rectal bleeding, GI evaluation in progress. Colonoscopy planned for tomorrow.  3. Secondary Hyperparathyroidism: with outpatient labs: PTH 413, phosphorus 7.5, calcium 9.0 on 10/12/22.   Lab Results  Component Value Date   PTH 160 (H) 04/06/2016   CALCIUM 8.6 (L) 10/21/2022   CAION 0.99 (L) 02/11/2022   PHOS 8.9 (H) 10/21/2022    Will continue to monitor bone minerals during this admission.  Patient prescribed Cinacalcet and calcitriol outpatient.  4.  Hypertension with chronic kidney disease.  Home regimen includes amlodipine, carvedilol, hydralazine, and metoprolol.   LOS: 2 Erek Kowal 10/16/20241:20 PM

## 2022-10-21 NOTE — Care Plan (Signed)
Patient not clean for colonoscopy, will push to tomorrow. Ordered golytely for tonight. She was instructed to take it slow and to be finished by 10 am tomorrow if possible.  Merlyn Lot MD, MPH

## 2022-10-21 NOTE — TOC Initial Note (Signed)
Transition of Care [facility]) - Initial/Assessment Note    Patient Details  Name: Patricia [name] MRN: [id number] Date of Birth: October 05, 1975  [facility] CM/SW Contact:    Darolyn Rua, LCSW Phone Number: 10/21/2022, 9:31 AM  Clinical Narrative:                  Patient presents from home with rectal bleeding.  Pt has outpatient dialysis with [facility] MWF. Pt lives with her two daughters who assist with transport to appointments. Pt active with Dr. Barth Kirks for primary care. Pt has no transportation needs.   Per notes, pt had [facility] CSW to confirm with Elnita Maxwell with [facility]. No other anticipated TOC needs at this time.                  Expected Discharge Plan: Home w Home Health Services Barriers to Discharge: Continued Medical Work up   Patient Goals and CMS Choice Patient states their goals for this hospitalization and ongoing recovery are:: to go home CMS Medicare.gov Compare Post Acute Care list provided to:: Patient Choice offered to / list presented to : Patient      Expected Discharge Plan and Services       Living arrangements for the past 2 months: Single Family Home                                      Prior Living Arrangements/Services Living arrangements for the past 2 months: Single Family Home Lives with:: Self                   Activities of Daily Living   ADL Screening (condition at time of admission) Independently performs ADLs?: Yes (appropriate for developmental age) Is the patient deaf or have difficulty hearing?: No Does the patient have difficulty seeing, even when wearing glasses/contacts?: No Does the patient have difficulty concentrating, remembering, or making decisions?: No  Permission Sought/Granted                  Emotional Assessment              Admission diagnosis:  Rectal bleeding [K62.5] Lower GI bleed [K92.2] Acute on chronic anemia [D64.9] Patient Active Problem List    Diagnosis Date Noted   Acute on chronic anemia 10/19/2022   Obesity (BMI 30-39.9) 08/25/2022   Acute encephalopathy 08/24/2022   (HFpEF) heart failure with preserved ejection fraction (HCC) 08/24/2022   Acute respiratory distress 08/11/2022   Pneumonia due to COVID-19 virus 08/10/2022   Facial twitching 08/09/2022   CAP (community acquired pneumonia) 07/21/2022   Acute pulmonary edema (HCC) 07/20/2022   Hypervolemia associated with renal insufficiency 05/11/2022   Hyperkalemia 05/11/2022   Restless leg syndrome 04/23/2022   Anaphylactic shock, unspecified, initial encounter 09/07/2021   Gout 08/21/2021   GERD without esophagitis 08/21/2021   Anxiety and depression 08/21/2021   Neuropathic pain 05/26/2021   Dystonia 01/13/2021   Polyneuropathy associated with underlying disease (HCC) 01/13/2021   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   Other specified complication of vascular prosthetic devices, implants and grafts, initial encounter (HCC) 09/03/2020   Contact with and (suspected) exposure to covid-19 08/13/2020   Altered mental status, unspecified 06/25/2020   Other irregular eye movements 06/25/2020   PD catheter dysfunction (HCC) 04/26/2020   Other disorders of phosphorus metabolism 04/17/2020   Hypercalcemia 04/15/2020   Impacted cerumen of both ears 04/05/2020  Chronic gouty arthritis 04/05/2020   Generalized anxiety disorder 04/05/2020   History of hypertension 04/05/2020   Idiopathic peripheral neuropathy 04/05/2020   Primary insomnia 04/05/2020   Malnutrition of moderate degree 03/13/2020   Hypertensive urgency 03/11/2020   Myoclonia 03/11/2020   Abscess of vulva 02/19/2020   Pericolonic abscess 02/06/2020   Chest pain 05/10/2019   Anemia 04/25/2019   SVT (supraventricular tachycardia) (HCC) 04/25/2019   Elevated troponin 04/25/2019   ESRD (end stage renal disease) (HCC) 04/25/2019   Bacterial infection, unspecified 04/17/2019   Pain, unspecified 04/17/2019   Pruritus,  unspecified 04/17/2019   Benign paroxysmal positional vertigo of left ear 08/17/2018   Acute on chronic blood loss anemia 05/21/2018   GI bleed 05/20/2018   Symptomatic anemia 10/28/2017   Acidosis 05/24/2017   Chills (without fever) 05/24/2017   Coagulation defect, unspecified (HCC) 05/24/2017   Dependence on renal dialysis (HCC) 05/24/2017   Encounter for fitting and adjustment of extracorporeal dialysis catheter (HCC) 05/24/2017   Fluid overload, unspecified 05/24/2017   Iron deficiency anemia, unspecified 05/24/2017   Liver disease, unspecified 05/24/2017   Other disorders of electrolyte and fluid balance, not elsewhere classified 05/24/2017   Other disorders resulting from impaired renal tubular function 05/24/2017   Other long term (current) drug therapy 05/24/2017   Secondary hyperparathyroidism of renal origin (HCC) 05/24/2017   Unspecified abnormal findings in urine 05/24/2017   Unspecified jaundice 05/24/2017   Chronic kidney disease 05/24/2017   Type 2 diabetes mellitus with other diabetic kidney complication (HCC) 05/24/2017   ESRD on peritoneal dialysis (HCC) 04/01/2016   Anemia due to chronic kidney disease 04/01/2016   GERD (gastroesophageal reflux disease) 04/01/2016   Atypical chest pain    Nephrotic syndrome 10/02/2013   Dyslipidemia 10/02/2013   PAT (paroxysmal atrial tachycardia) (HCC) 09/29/2013   Normocytic anemia 06/29/2013   Glomerulonephritis 01/28/2013   Dyspnea 07/04/2011   Morbid obesity (HCC) 07/04/2011   Hypertension 07/04/2011   Hypercholesterolemia 07/04/2011   PCP:  Rick Duff, PA-C Pharmacy:   [id number] [place], [place] - [address] AT [facility] OF [address] [place] [address] Phone: [phone] Fax: [phone]  [facility] [address] [place] [address] Phone: [phone] Fax: [phone]  [facility] - [place], [place] -  [address] [place] [address] Phone: [phone] Fax: [phone]  [facility] #[id number] - [place], [place] - [address] AT [facility] OF [address] [place] [address] Phone: [phone] Fax: [phone]  [facility] [address] [place] [address] Phone: [phone] Fax: [phone]     Social Determinants of Health (SDOH) Social History: SDOH Screenings   Food Insecurity: No Food Insecurity (10/20/2022)  Housing: Low Risk  (10/20/2022)  Transportation Needs: No Transportation Needs (10/20/2022)  Utilities: Not At Risk (10/20/2022)  Financial Resource Strain: Low Risk  (09/04/2019)   Received from [facility]  Physical Activity: Insufficiently Active (09/04/2019)   Received from [facility], [facility]  Social Connections: Unknown (05/10/2021)   Received from [facility], [facility]  Stress: Stress Concern Present (09/04/2019)   Received from [facility], [facility]  Tobacco Use: Low Risk  (10/20/2022)  Health Literacy: Low Risk  (07/04/2019)   Received from [facility], [name]   SDOH Interventions:     Readmission Risk  Interventions    10/21/2022    9:30 AM 08/12/2022    9:33 AM 07/23/2022    9:17 AM  Readmission Risk Prevention Plan  Transportation Screening Complete Complete Complete  Medication Review Oceanographer) Complete Complete Complete  PCP or Specialist appointment within 3-5 days of discharge  Complete   HRI or Home Care Consult Complete Complete   SW Recovery Care/Counseling Consult Complete  Complete  Palliative Care Screening Not Applicable  Not Applicable  Skilled Nursing Facility Not Applicable Not Applicable Not Applicable

## 2022-10-21 NOTE — Plan of Care (Signed)

## 2022-10-21 NOTE — Progress Notes (Signed)
  Received patient in bed to unit.   Informed consent signed and in chart.    TX duration 2.5hrs     Transported back to floor  Hand-off given to patient's nurse. No distreess noted    Access used: R AVF Access issues: pain towards end of tx    Total UF removed: 1.0L Medication(s) given: none Post HD VS: 153/73 Post HD weight 100.0     Lynann Beaver  [facility]

## 2022-10-21 NOTE — Progress Notes (Signed)
Pt. Running on 1k bath from beginning of tx per Georgeanna Harrison, NP; stat Potassium collected at 1130 per Dr. Thedore Mins.

## 2022-10-21 NOTE — Plan of Care (Signed)
  Problem: Education: Goal: Knowledge of General Education information will improve Description: Including pain rating scale, medication(s)/side effects and non-pharmacologic comfort measures Outcome: Progressing   Problem: Clinical Measurements: Goal: Ability to maintain clinical measurements within normal limits will improve Outcome: Progressing   Problem: Bowel/Gastric: Goal: Will show no signs and symptoms of gastrointestinal bleeding Outcome: Progressing   Problem: Clinical Measurements: Goal: Complications related to the disease process, condition or treatment will be avoided or minimized Outcome: Progressing

## 2022-10-21 NOTE — Progress Notes (Signed)
[facility] Progress Note  Patient: Patricia [name]    ZOX:[id number]  DOA: 10/19/2022     Date of Service: the patient was seen and examined on 10/21/2022  Chief Complaint  Patient presents with   Rectal Bleeding   Brief hospital course: Patricia [name] is a 47 year old female with history of hypertension, depression, anxiety, restless leg syndrome, GERD, end-stage renal disease on hemodialysis, gastroparesis, who presents emergency department for chief concerns of rectal bleed.   Vitals in the ED showed temperature of 97.9, respiration rate of 18, heart rate of 99, blood pressure 142/95, SpO2 100% on room air.   Serum sodium is 137, potassium 4.2, chloride 93, bicarb 29, BUN of 39, serum creatinine of 5.78, EGFR of 8, WBC 5.4, hemoglobin 8.1, platelets of 228.   CT abdomen pelvis without contrast: 1. 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 Question fistulous communication between the rectum and sigmoid colon. 2. Cholelithiasis. 3. Atrophic kidneys with bilateral kidney stones. 4. Aortic atherosclerosis.  Assessment and Plan:  Acute on chronic anemia due to lower GI bleeding Suspect secondary to diverticular bleed Baseline hemoglobin is 7.5-9.0 Patient's hemoglobin is within range, no indication for transfusion at this time GI consulted, started on clear liquid diet, patient agreed for colonoscopy 10/16 Hb 7.1, transfuse 1 unit of PRBC Check CBC in a.m., goal Hgb > 7.5   ESRD (end stage renal disease) (HCC) Home lanthanum 1000 to 2000 mg p.o. resume home dosing Nephrology consulted for dialysis, MWF schedule   Hypertension Amlodipine 10 mg daily, Coreg 25 mg p.o. twice daily with meals, hydralazine 75 mg every 8 hours, Imdur 60 mg every evening resumed Hydralazine 10 mg p.o. every 6 hours as needed for SBP greater than 175, 4 days ordered   Type 2 diabetes mellitus with other diabetic kidney complication  (HCC) Not on home diabetic medication, presumed secondary to end-stage renal disease Patient had A1c of 4.7 on 08/24/2022 Insulin SSI with agents coverage not ordered on admission due to needlestick sparing for patient A.m. can order insulin SSI if patient's blood glucose continues to elevate   Morbid obesity (HCC) This complicates overall care and prognosis.    Restless leg syndrome Home ropinirole 1 mg nightly resumed   Neuropathic pain Home gabapentin 300 mg 3 times daily resumed   Body mass index is 34.53 kg/m.  Interventions:  Diet: CLD  DVT Prophylaxis: SCD, pharmacological prophylaxis contraindicated due to Rectal bleeding    Advance goals of care discussion: Full code  Family Communication: family was not present at bedside, at the time of interview.  The pt provided permission to discuss medical plan with the family. Opportunity was given to ask question and all questions were answered satisfactorily.   Disposition:  Pt is from home, admitted with rectal bleeding, still has low hemoglobin, may need colonoscopy, which precludes a safe discharge. Discharge to home, when cleared by GI.  Subjective: No significant events overnight, patient had prep for colonoscopy, noticed dark-colored BM but no active bleeding.  Still has off-and-on abdominal pain and neuropathy in bilateral lower extremities, patient is asking for neurology follow-up, recommended to follow outpatient.  Patient understands that she is not well-prepped today, colonoscopy will be done tomorrow a.m. Patient was seen during hemodialysis, tolerated procedure well.   Physical Exam: General: NAD, lying comfortably Appear in no distress, affect appropriate Eyes: PERRLA ENT: Oral Mucosa Clear, moist  Neck: no JVD,  Cardiovascular: S1 and S2 Present, no Murmur,  Respiratory:  good respiratory effort, Bilateral Air entry equal and Decreased, no Crackles, no wheezes Abdomen: Bowel Sound present, Soft and mild  generalized abd tenderness,  Skin: no rashes Extremities: no Pedal edema, no calf tenderness Neurologic: without any new focal findings Gait not checked due to patient safety concerns  Vitals:   10/21/22 1251 10/21/22 1311 10/21/22 1339 10/21/22 1355  BP:  (!) 151/72 (!) 152/62 (!) 150/67  Pulse:  82    Resp:  18 18 18   Temp:  98.2 F (36.8 C) 98.2 F (36.8 C) 98.8 F (37.1 C)  TempSrc:  Oral Oral Oral  SpO2:  100% 100% 100%  Weight: 100 kg     Height:        Intake/Output Summary (Last 24 hours) at 10/21/2022 1446 Last data filed at 10/21/2022 1211 Gross per 24 hour  Intake 50 ml  Output 1000 ml  Net -950 ml   Filed Weights   10/19/22 1826 10/21/22 0923 10/21/22 1251  Weight: 108.1 kg 101.1 kg 100 kg    Data Reviewed: I have personally reviewed and interpreted daily labs, tele strips, imagings as discussed above. I reviewed all nursing notes, pharmacy notes, vitals, pertinent old records I have discussed plan of care as described above with RN and patient/family.  CBC: Recent Labs  Lab 10/19/22 1850 10/20/22 0606 10/20/22 1351 10/21/22 0508  WBC 5.4 5.3  --  5.7  HGB 8.1* 7.5* 7.5* 7.1*  HCT 26.6* 24.7* 24.2* 22.7*  MCV 81.3 79.9*  --  77.5*  PLT 228 237  --  234   Basic Metabolic Panel: Recent Labs  Lab 10/19/22 1850 10/20/22 0606 10/21/22 0508 10/21/22 1140  NA 137 135 131*  --   K 4.2 4.9 5.8* 3.9  CL 93* 92* 91*  --   CO2 29 28 24   --   GLUCOSE 115* 104* 101*  --   BUN 39* 46* 56*  --   CREATININE 5.78* 6.66* 8.56*  --   CALCIUM 8.2* 8.3* 8.6*  --   MG  --   --  2.2  --   PHOS  --   --  8.9*  --     Studies: No results found.  Scheduled Meds:  allopurinol  100 mg Oral QHS   amLODipine  10 mg Oral Daily   ascorbic acid  500 mg Oral BID   carbidopa-levodopa  1 tablet Oral TID   carvedilol  25 mg Oral BID WC   DULoxetine  30 mg Oral Daily   gabapentin  300 mg Oral TID   hydrALAZINE  75 mg Oral Q8H   influenza vac split trivalent PF   0.5 mL Intramuscular Tomorrow-1000   isosorbide mononitrate  60 mg Oral QPM   lanthanum  2,000 mg Oral TID WC   multivitamin  1 tablet Oral QHS   pantoprazole  80 mg Oral BID   polyethylene glycol-electrolytes  4,000 mL Oral Once   rOPINIRole  1 mg Oral QHS   Continuous Infusions:   PRN Meds: acetaminophen **OR** acetaminophen, ALPRAZolam, chlorpheniramine-HYDROcodone, hydrALAZINE, HYDROmorphone (DILAUDID) injection, hydrOXYzine, lanthanum **AND** lanthanum, polyethylene glycol, promethazine, traZODone  Time spent: 55 minutes  Author: Gillis Santa. MD [facility] 10/21/2022 2:46 PM  To reach On-call, see care teams to locate the attending and reach out to them via [email or web address]. If 7PM-7AM, please contact night-coverage If you still have difficulty reaching the attending provider, please page the [facility] (Director on Call) for [facility] on amion for assistance.

## 2022-10-22 ENCOUNTER — Inpatient Hospital Stay: Payer: 59 | Admitting: General Practice

## 2022-10-22 ENCOUNTER — Encounter: Payer: Self-pay | Admitting: Internal Medicine

## 2022-10-22 ENCOUNTER — Encounter
Admission: EM | Disposition: A | Discharge status: Home / Self Care | Payer: Self-pay | Source: Home / Self Care | Attending: Student

## 2022-10-22 DIAGNOSIS — D649 Anemia, unspecified: Secondary | ICD-10-CM | POA: Diagnosis not present

## 2022-10-22 HISTORY — PX: COLONOSCOPY: SHX5424

## 2022-10-22 LAB — TYPE AND SCREEN
ABO/RH(D): O NEG
Antibody Screen: NEGATIVE
Unit division: 0

## 2022-10-22 LAB — BASIC METABOLIC PANEL
Anion gap: 12 (ref 5–15)
BUN: 37 mg/dL — ABNORMAL HIGH (ref 6–20)
CO2: 28 mmol/L (ref 22–32)
Calcium: 8.8 mg/dL — ABNORMAL LOW (ref 8.9–10.3)
Chloride: 92 mmol/L — ABNORMAL LOW (ref 98–111)
Creatinine, Ser: 7.09 mg/dL — ABNORMAL HIGH (ref 0.44–1.00)
GFR, Estimated: 7 mL/min — ABNORMAL LOW (ref >60)
Glucose, Bld: 108 mg/dL — ABNORMAL HIGH (ref 70–99)
Potassium: 4.7 mmol/L (ref 3.5–5.1)
Sodium: 132 mmol/L — ABNORMAL LOW (ref 135–145)

## 2022-10-22 LAB — BPAM RBC
Blood Product Expiration Date: 202410222359
ISSUE DATE / TIME: 202410161335
Unit Type and Rh: 9500

## 2022-10-22 LAB — CBC
HCT: 27 % — ABNORMAL LOW (ref 36.0–46.0)
Hemoglobin: 8.5 g/dL — ABNORMAL LOW (ref 12.0–15.0)
MCH: 24.8 pg — ABNORMAL LOW (ref 26.0–34.0)
MCHC: 31.5 g/dL (ref 30.0–36.0)
MCV: 78.7 fL — ABNORMAL LOW (ref 80.0–100.0)
Platelets: 196 10*3/uL (ref 150–400)
RBC: 3.43 MIL/uL — ABNORMAL LOW (ref 3.87–5.11)
RDW: 17.9 % — ABNORMAL HIGH (ref 11.5–15.5)
WBC: 4.8 10*3/uL (ref 4.0–10.5)
nRBC: 0 % (ref 0.0–0.2)

## 2022-10-22 LAB — PHOSPHORUS: Phosphorus: 7.8 mg/dL — ABNORMAL HIGH (ref 2.5–4.6)

## 2022-10-22 LAB — MAGNESIUM: Magnesium: 2.1 mg/dL (ref 1.7–2.4)

## 2022-10-22 SURGERY — COLONOSCOPY
Anesthesia: General

## 2022-10-22 MED ORDER — PROPOFOL 500 MG/50ML IV EMUL
INTRAVENOUS | Status: DC | PRN
  Administered 2022-10-22: 140 ug/kg/min via INTRAVENOUS

## 2022-10-22 MED ORDER — SODIUM CHLORIDE 0.9 % IV SOLN
INTRAVENOUS | Status: DC | PRN
Start: 2022-10-22 — End: 2022-10-22

## 2022-10-22 MED ORDER — PROPOFOL 10 MG/ML IV BOLUS
INTRAVENOUS | Status: AC
  Filled 2022-10-22: qty 40

## 2022-10-22 MED ORDER — GLYCOPYRROLATE 0.2 MG/ML IJ SOLN
INTRAMUSCULAR | Status: AC
  Filled 2022-10-22: qty 2

## 2022-10-22 MED ORDER — LIDOCAINE HCL (CARDIAC) PF 100 MG/5ML IV SOSY
PREFILLED_SYRINGE | INTRAVENOUS | Status: DC | PRN
  Administered 2022-10-22: 50 mg via INTRAVENOUS

## 2022-10-22 MED ORDER — PROPOFOL 10 MG/ML IV BOLUS
INTRAVENOUS | Status: DC | PRN
  Administered 2022-10-22: 70 mg via INTRAVENOUS

## 2022-10-22 MED ORDER — LIDOCAINE HCL (PF) 2 % IJ SOLN
INTRAMUSCULAR | Status: AC
  Filled 2022-10-22: qty 10

## 2022-10-22 NOTE — TOC Progression Note (Signed)
Transition of Care [facility]) - Progression Note    Patient Details  Name: Patricia [name] MRN: [id number] Date of Birth: December 19, 1975  Transition of [facility]) CM/SW Contact  Truddie Hidden, RN Phone Number: 10/22/2022, 3:46 PM  Clinical Narrative:    Patient is active with [facility] per Elnita Maxwell with [facility].   Expected Discharge Plan: Home w Home Health Services Barriers to Discharge: Continued Medical Work up  Expected Discharge Plan and Services       Living arrangements for the past 2 months: Single Family Home                                       Social Determinants of Health (SDOH) Interventions SDOH Screenings   Food Insecurity: No Food Insecurity (10/20/2022)  Housing: Low Risk  (10/20/2022)  Transportation Needs: No Transportation Needs (10/20/2022)  Utilities: Not At Risk (10/20/2022)  Financial Resource Strain: Low Risk  (09/04/2019)   Received from [facility], [facility]  Physical Activity: Insufficiently Active (09/04/2019)   Received from [facility], [facility]  Social Connections: Unknown (05/10/2021)   Received from [facility], [facility]  Stress: Stress Concern Present (09/04/2019)   Received from [facility], [facility]  Tobacco Use: Low Risk  (10/22/2022)  Health Literacy: Low Risk  (07/04/2019)   Received from [facility], [facility]    Readmission Risk Interventions    10/21/2022    9:30 AM 08/12/2022    9:33 AM 07/23/2022    9:17 AM  Readmission Risk Prevention Plan  Transportation Screening Complete Complete Complete  Medication Review (RN Care Manager) Complete Complete Complete  PCP or Specialist appointment within 3-5 days of discharge  Complete   HRI or Home Care Consult Complete Complete   SW Recovery Care/Counseling Consult Complete  Complete  Palliative Care Screening Not Applicable  Not Applicable  Skilled Nursing Facility Not Applicable Not Applicable Not Applicable

## 2022-10-22 NOTE — Anesthesia Preprocedure Evaluation (Addendum)
Anesthesia Evaluation  Patient identified by MRN, date of birth, ID band Patient awake    Reviewed: Allergy & Precautions, NPO status , Patient's Chart, lab work & pertinent test results  Airway Mallampati: III  TM Distance: >3 FB Neck ROM: full    Dental  (+) Teeth Intact   Pulmonary neg pulmonary ROS, shortness of breath and with exertion, sleep apnea    Pulmonary exam normal  + decreased breath sounds      Cardiovascular Exercise Tolerance: Poor hypertension, +CHF  Normal cardiovascular exam+ dysrhythmias III Rhythm:Regular Rate:Normal  Severely elevated pulmonary artery pressures   Neuro/Psych  Headaches   Depression    negative neurological ROS  negative psych ROS   GI/Hepatic negative GI ROS, Neg liver ROS,GERD  Medicated,,  Endo/Other  negative endocrine ROS  Morbid obesity  Renal/GU ESRF and DialysisRenal diseasenegative Renal ROS  negative genitourinary   Musculoskeletal   Abdominal  (+) + obese  Peds negative pediatric ROS (+)  Hematology negative hematology ROS (+)   Anesthesia Other Findings Past Medical History: No date: Acid reflux 04/2019: Anemia     Comment:  REQUIRING BLOOD TRANSFUSIONS No date: Anxiety No date: Arthritis No date: CHF (congestive heart failure) (HCC) No date: Dyspnea     Comment:  with exertion No date: Dysrhythmia     Comment:  tachycardia No date: ESRD (end stage renal disease) (HCC)     Comment:  [facility]- [facility] 11/2020: Facial twitching     Comment:  mouth twitch, eyes roll back, neurology started patient               on low dose   Sinemet IR. 12/2017: GI bleeding No date: Gout No date: Headache(784.0)     Comment:  "related to chemo; sometimes weekly" (09/12/2013) No date: Heart murmur     Comment:  Systolic per Dr Orpah Cobb' notes No date: High cholesterol "a couple": History of blood transfusion     Comment:  "related to low counts" No date:  Hypertension No date: Iron deficiency anemia     Comment:  "get epogen shots q month" (02/20/2014) No date: MCGN (minimal change glomerulonephritis)     Comment:  "using chemo to tx;  finished my last tx in 12/2013" No date: Neuropathy No date: Peritoneal dialysis status (HCC) 08/15/2017: Stroke (HCC)     Comment:  ruled out that it was not a stroke  Past Surgical History: 2010: ABDOMINAL HYSTERECTOMY     Comment:  "laparoscopic" 1994: ANKLE FRACTURE SURGERY; Right 04/14/2016: AV FISTULA PLACEMENT; Left     Comment:  Procedure: ARTERIOVENOUS (AV) FISTULA CREATION;                Surgeon: Chuck Hint, MD;  Location: [facility] OR;                Service: Vascular;  Laterality: Left; 08/09/2017: AV FISTULA PLACEMENT; Left     Comment:  Procedure: INSERTION OF ARTERIOVENOUS (AV) GORE-TEX               GRAFT LEFT ARM;  Surgeon: Sherren Kerns, MD;                Location: [facility] OR;  Service: Vascular;  Laterality: Left; 03/15/2020: AV FISTULA PLACEMENT; Right     Comment:  Procedure: RIGHT ARM ARTERIOVENOUS (AV) FISTULA CREATION              1st Stage Basilic Transposition;  Surgeon: Coral Else  W, MD;  Location: [facility] OR;  Service: Vascular;  Laterality:              Right; 02/24/2021: AV FISTULA PLACEMENT; Right     Comment:  Procedure: RIGHT ARM ARTERIOVENOUS  FISTULA CREATION;                Surgeon: Victorino Sparrow, MD;  Location: [facility] OR;                Service: Vascular;  Laterality: Right;  regional block 08/11/2017: AVGG REMOVAL; Left     Comment:  Procedure: REMOVAL OF ARTERIOVENOUS GORETEX GRAFT               (AVGG);  Surgeon: Nada Libman, MD;  Location: [facility] OR;              Service: Vascular;  Laterality: Left; 05/17/2020: BASCILIC VEIN TRANSPOSITION; Right     Comment:  Procedure: RIGHT UPPER EXTREMITY SECOND STAGE BASCILIC               VEIN TRANSPOSITION;  Surgeon: Nada Libman, MD;                Location: [facility] OR;  Service: Vascular;  Laterality:  Right; 09/03/2017: BIOPSY     Comment:  Procedure: BIOPSY;  Surgeon: Kathi Der, MD;                Location: [facility] ENDOSCOPY;  Service: Gastroenterology;; 12/24/2017: BIOPSY     Comment:  Procedure: BIOPSY;  Surgeon: Lemar Lofty.,               MD;  Location: [facility] ENDOSCOPY;  Service: Gastroenterology;; 2000's: CARDIAC CATHETERIZATION 09/04/2017: COLONOSCOPY WITH PROPOFOL; N/A     Comment:  Procedure: COLONOSCOPY WITH PROPOFOL;  Surgeon: Kerin Salen, MD;  Location: [facility] ENDOSCOPY;  Service:               Gastroenterology;  Laterality: N/A; 12/24/2017: ENTEROSCOPY; N/A     Comment:  Procedure: ENTEROSCOPY;  Surgeon: Lemar Lofty., MD;  Location: [facility] ENDOSCOPY;  Service:               Gastroenterology;  Laterality: N/A; No date: ESOPHAGOGASTRODUODENOSCOPY 06/04/2017: ESOPHAGOGASTRODUODENOSCOPY (EGD) WITH PROPOFOL; Left     Comment:  Procedure: ESOPHAGOGASTRODUODENOSCOPY (EGD) WITH               PROPOFOL;  Surgeon: Willis Modena, MD;  Location: [facility]               ENDOSCOPY;  Service: Endoscopy;  Laterality: Left; 09/03/2017: ESOPHAGOGASTRODUODENOSCOPY (EGD) WITH PROPOFOL; N/A     Comment:  Procedure: ESOPHAGOGASTRODUODENOSCOPY (EGD) WITH               PROPOFOL;  Surgeon: Kathi Der, MD;  Location: [facility]               ENDOSCOPY;  Service: Gastroenterology;  Laterality: N/A; 04/27/2022: ESOPHAGOGASTRODUODENOSCOPY (EGD) WITH PROPOFOL; N/A     Comment:  Procedure: ESOPHAGOGASTRODUODENOSCOPY (EGD) WITH               PROPOFOL;  Surgeon: Regis Bill, MD;  Location:               [facility] ENDOSCOPY;  Service: Endoscopy;  Laterality: N/A; 04/02/2017: FISTULA SUPERFICIALIZATION; Left     Comment:  Procedure: FISTULA SUPERFICIALIZATION  LEFT ARM;                Surgeon: Nada Libman, MD;  Location: [facility] OR;                Service: Vascular;  Laterality: Left; 04/14/2021: FISTULA SUPERFICIALIZATION; Right     Comment:  Procedure: RIGHT ARM FISTULA  SUPERFICIALIZATION;                Surgeon: Victorino Sparrow, MD;  Location: [facility] OR;                Service: Vascular;  Laterality: Right;  PERIPHERAL NERVE               BLOCK 04/07/2016: FISTULOGRAM; Left     Comment:  Procedure: FISTULOGRAM;  Surgeon: Maeola Harman, MD;  Location: [facility] OR;  Service: Vascular;                Laterality: Left; No date: FRACTURE SURGERY     Comment:  right ankle 10/29/2017: GIVENS CAPSULE STUDY; N/A     Comment:  Procedure: GIVENS CAPSULE STUDY;  Surgeon: Charlott Rakes, MD;  Location: [facility] ENDOSCOPY;  Service:               Endoscopy;  Laterality: N/A; 12/24/2017: GIVENS CAPSULE STUDY; N/A     Comment:  Procedure: GIVENS CAPSULE STUDY;  Surgeon: Lemar Lofty., MD;  Location: [facility] ENDOSCOPY;  Service:               Gastroenterology;  Laterality: N/A; 05/22/2018: GIVENS CAPSULE STUDY; N/A     Comment:  Procedure: GIVENS CAPSULE STUDY;  Surgeon: Lemar Lofty., MD;  Location: [facility] ENDOSCOPY;  Service:               Gastroenterology;  Laterality: N/A; 04/07/2016: INSERTION OF DIALYSIS CATHETER; Right     Comment:  Procedure: INSERTION OF DIALYSIS CATHETER;  Surgeon:               Maeola Harman, MD;  Location: [facility] OR;  Service:              Vascular;  Laterality: Right; 04/04/2017: INSERTION OF DIALYSIS CATHETER; Right     Comment:  Procedure: INSERTION OF DIALYSIS CATHETER;  Surgeon:               Maeola Harman, MD;  Location: [facility] OR;                Service: Vascular;  Laterality: Right; 03/13/2020: IR FLUORO GUIDE CV LINE LEFT 03/13/2020: IR US GUIDE VASC ACCESS LEFT 05/12/2019: LEFT HEART CATH AND CORONARY ANGIOGRAPHY; N/A     Comment:  Procedure: LEFT HEART CATH AND CORONARY ANGIOGRAPHY;                Surgeon: Orpah Cobb, MD;  [facility]: [facility] INVASIVE CV               LAB;  Service: Cardiovascular;  Laterality: N/A; 04/14/2016: LIGATION OF ARTERIOVENOUS   FISTULA; Left     Comment:  Procedure: LIGATION OF ARTERIOVENOUS  FISTULA;  Surgeon:  Chuck Hint, MD;  Location: [facility] OR;  Service:               Vascular;  Laterality: Left; 05/17/2020: LIGATION OF COMPETING BRANCHES OF ARTERIOVENOUS FISTULA;  Right     Comment:  Procedure: LIGATION OF COMPETING BRANCHES OF RIGHT ARM               ARTERIOVENOUS FISTULA;  Surgeon: Nada Libman, MD;                Location: [facility] OR;  Service: Vascular;  Laterality: Right; 06/19/2016: PATCH ANGIOPLASTY; Left     Comment:  Procedure: PATCH ANGIOPLASTY LEFT ARTERIOVENOUS FISTULA;              Surgeon: Chuck Hint, MD;  Location: [facility] OR;                Service: Vascular;  Laterality: Left; 09/04/2017: POLYPECTOMY     Comment:  Procedure: POLYPECTOMY;  Surgeon: Kerin Salen, MD;                Location: [facility] ENDOSCOPY;  Service: Gastroenterology;; 06/19/2016: REVISON OF ARTERIOVENOUS FISTULA; Left     Comment:  Procedure: REVISION SUPERFICIALIZATION OF               BRACHIOCEPHALIC ARTERIOVENOUS FISTULA;  Surgeon: Chuck Hint, MD;  Location: [facility] OR;  Service: Vascular;               Laterality: Left; 02/19/2021: UPPER EXTREMITY VENOGRAPHY; Bilateral     Comment:  Procedure: UPPER EXTREMITY VENOGRAPHY;  Surgeon: Victorino Sparrow, MD;  Location: [facility] INVASIVE CV LAB;  Service:               Cardiovascular;  Laterality: Bilateral;  BMI    Body Mass Index: 34.53 kg/m      Reproductive/Obstetrics negative OB ROS                              Anesthesia Physical Anesthesia Plan  ASA: 3  Anesthesia Plan: General   Post-op Pain Management:    Induction: Intravenous  PONV Risk Score and Plan: Propofol infusion and TIVA  Airway Management Planned: Natural Airway and Nasal Cannula  Additional Equipment:   Intra-op Plan:   Post-operative Plan:   Informed Consent: I have reviewed the patients History and Physical,  chart, labs and discussed the procedure including the risks, benefits and alternatives for the proposed anesthesia with the patient or authorized representative who has indicated his/her understanding and acceptance.     Dental Advisory Given  Plan Discussed with: CRNA and Surgeon  Anesthesia Plan Comments:         Anesthesia Quick Evaluation

## 2022-10-22 NOTE — Care Management Important Message (Signed)
Important Message  Patient Details  Name: Patricia [name] MRN: [id number] Date of Birth: 01-19-1975   Important Message Given:  Yes - Medicare IM     Olegario Messier A Jamika Sadek 10/22/2022, 3:30 PM

## 2022-10-22 NOTE — Discharge Planning (Signed)
ESTBLISHED HEMODIALYSIS Outpatient Facility  [facility]  [address]  [place] [address] [phone]  Schedule: Monday Wednesday and Friday @ 05:25am  Dimas Chyle Dialysis Coordinator II  [facility] Cell: [phone] eFax: [phone] [email or web address]

## 2022-10-22 NOTE — Transfer of Care (Signed)
Immediate Anesthesia Transfer of Care Note  Patient: Patricia [name]  Procedure(s) Performed: COLONOSCOPY  Patient Location: Endoscopy Unit  Anesthesia Type:General  Level of Consciousness: awake, alert , and oriented  Airway & Oxygen Therapy: Patient Spontanous Breathing  Post-op Assessment: Report given to RN and Post -op Vital signs reviewed and stable  Post vital signs: Reviewed and stable  Last Vitals:  Vitals Value Taken Time  BP 135/60 10/22/22 1433  Temp    Pulse 82 10/22/22 1433  Resp 12   SpO2 100 % 10/22/22 1433  Vitals shown include unfiled device data.  Last Pain:  Vitals:   10/22/22 1344  TempSrc: Temporal  PainSc:       Patients Stated Pain Goal: 2 (10/20/22 2057)  Complications: No notable events documented.

## 2022-10-22 NOTE — Plan of Care (Signed)
  Problem: Education: Goal: Knowledge of General Education information will improve Description: Including pain rating scale, medication(s)/side effects and non-pharmacologic comfort measures Outcome: Progressing   Problem: Health Behavior/Discharge Planning: Goal: Ability to manage health-related needs will improve Outcome: Progressing   Problem: Clinical Measurements: Goal: Ability to maintain clinical measurements within normal limits will improve Outcome: Progressing Goal: Will remain free from infection Outcome: Progressing Goal: Diagnostic test results will improve Outcome: Progressing Goal: Respiratory complications will improve Outcome: Progressing Goal: Cardiovascular complication will be avoided Outcome: Progressing   Problem: Activity: Goal: Risk for activity intolerance will decrease Outcome: Progressing   Problem: Nutrition: Goal: Adequate nutrition will be maintained Outcome: Progressing   Problem: Coping: Goal: Level of anxiety will decrease Outcome: Progressing   Problem: Elimination: Goal: Will not experience complications related to bowel motility Outcome: Progressing Goal: Will not experience complications related to urinary retention Outcome: Progressing   Problem: Pain Managment: Goal: General experience of comfort will improve Outcome: Progressing   Problem: Safety: Goal: Ability to remain free from injury will improve Outcome: Progressing   Problem: Skin Integrity: Goal: Risk for impaired skin integrity will decrease Outcome: Progressing   Problem: Education: Goal: Ability to identify signs and symptoms of gastrointestinal bleeding will improve Outcome: Progressing   Problem: Bowel/Gastric: Goal: Will show no signs and symptoms of gastrointestinal bleeding Outcome: Progressing   Problem: Fluid Volume: Goal: Will show no signs and symptoms of excessive bleeding Outcome: Progressing   Problem: Clinical Measurements: Goal:  Complications related to the disease process, condition or treatment will be avoided or minimized Outcome: Progressing   Problem: Education: Goal: Understanding of post-operative needs will improve Outcome: Progressing Goal: Individualized Educational Video(s) Outcome: Progressing   Problem: Clinical Measurements: Goal: Postoperative complications will be avoided or minimized Outcome: Progressing   Problem: Respiratory: Goal: Will regain and/or maintain adequate ventilation Outcome: Progressing

## 2022-10-22 NOTE — Plan of Care (Signed)
  Problem: Pain Managment: Goal: General experience of comfort will improve Outcome: Progressing   Problem: Elimination: Goal: Will not experience complications related to bowel motility Outcome: Progressing   Problem: Respiratory: Goal: Will regain and/or maintain adequate ventilation Outcome: Progressing

## 2022-10-22 NOTE — Op Note (Signed)
[facility] [name] Procedure Date: 10/22/2022 1:52 PM MRN: [id number] Account #: [id number] Date of Birth: 25-Mar-1975 Admit Type: Inpatient Age: 47 Room: [facility] ENDO ROOM 1 Gender: Female Note Status: Finalized Instrument Name: Prentice Docker [id number] Procedure:             Colonoscopy Indications:           Rectal bleeding Providers:             Eather Colas MD, MD Referring MD:          No Local Md, MD (Referring MD) Medicines:             Monitored Anesthesia Care Complications:         No immediate complications. Procedure:             Pre-Anesthesia Assessment:                        - Prior to the procedure, a History and Physical was                         performed, and patient medications and allergies were                         reviewed. The patient is competent. The risks and                         benefits of the procedure and the sedation options and                         risks were discussed with the patient. All questions                         were answered and informed consent was obtained.                         Patient identification and proposed procedure were                         verified by the physician, the nurse, the                         anesthesiologist and the technician in the endoscopy                         suite. Mental Status Examination: alert and oriented.                         Airway Examination: normal oropharyngeal airway and                         neck mobility. Respiratory Examination: clear to                         auscultation. CV Examination: normal. Prophylactic                         Antibiotics: The patient does not require prophylactic  antibiotics. Prior Anticoagulants: The patient has                         taken no anticoagulant or antiplatelet agents. ASA                         Grade Assessment: III - A patient with severe systemic                          disease. After reviewing the risks and benefits, the                         patient was deemed in satisfactory condition to                         undergo the procedure. The anesthesia plan was to use                         monitored anesthesia care (MAC). Immediately prior to                         administration of medications, the patient was                         re-assessed for adequacy to receive sedatives. The                         heart rate, respiratory rate, oxygen saturations,                         blood pressure, adequacy of pulmonary ventilation, and                         response to care were monitored throughout the                         procedure. The physical status of the patient was                         re-assessed after the procedure.                        After obtaining informed consent, the colonoscope was                         passed under direct vision. Throughout the procedure,                         the patient's blood pressure, pulse, and oxygen                         saturations were monitored continuously. The                         Colonoscope was introduced through the anus and                         advanced to the the cecum, identified by appendiceal  orifice and ileocecal valve. The colonoscopy was                         performed without difficulty. The patient tolerated                         the procedure well. The quality of the bowel                         preparation was fair. The ileocecal valve, appendiceal                         orifice, and rectum were photographed. Findings:      The perianal and digital rectal examinations were normal.      Multiple small-mouthed diverticula were found in the sigmoid colon,       descending colon and transverse colon.      Internal hemorrhoids were found during retroflexion. The hemorrhoids       were Grade II (internal hemorrhoids  that prolapse but reduce       spontaneously).      The exam was otherwise without abnormality on direct and retroflexion       views. Impression:            - Preparation of the colon was fair.                        - Diverticulosis in the sigmoid colon, in the                         descending colon and in the transverse colon.                        - Internal hemorrhoids.                        - The examination was otherwise normal on direct and                         retroflexion views.                        - No specimens collected. Recommendation:        - Return patient to hospital ward for ongoing care.                         Bleeding likely hemorrhoidal vs diverticular.                        - Advance diet as tolerated.                        - Continue present medications.                        - Return to GI clinic as previously scheduled. Procedure Code(s):     --- Professional ---                        [phone], Colonoscopy, flexible; diagnostic, including  collection of specimen(s) by brushing or washing, when                         performed (separate procedure) Diagnosis Code(s):     --- Professional ---                        K64.1, Second degree hemorrhoids                        K62.5, Hemorrhage of anus and rectum                        K57.30, Diverticulosis of large intestine without                         perforation or abscess without bleeding CPT copyright 2022 [employer]. All rights reserved. The codes documented in this report are preliminary and upon coder review may  be revised to meet current compliance requirements. Eather Colas MD, MD 10/22/2022 2:32:44 PM Number of Addenda: 0 Note Initiated On: 10/22/2022 1:52 PM Scope Withdrawal Time: 0 hours 5 minutes 9 seconds  Total Procedure Duration: 0 hours 26 minutes 11 seconds  Estimated Blood Loss:  Estimated blood loss: none.      [facility]

## 2022-10-22 NOTE — Progress Notes (Addendum)
[facility] Progress Note  Patient: Patricia [name]    ZOX:[id number]  DOA: 10/19/2022     Date of Service: the patient was seen and examined on 10/22/2022  Chief Complaint  Patient presents with   Rectal Bleeding   Brief hospital course: [name] is a 47 year old female with history of hypertension, depression, anxiety, restless leg syndrome, GERD, end-stage renal disease on hemodialysis, gastroparesis, who presents emergency department for chief concerns of rectal bleed.   Vitals in the ED showed temperature of 97.9, respiration rate of 18, heart rate of 99, blood pressure 142/95, SpO2 100% on room air.   Serum sodium is 137, potassium 4.2, chloride 93, bicarb 29, BUN of 39, serum creatinine of 5.78, EGFR of 8, WBC 5.4, hemoglobin 8.1, platelets of 228.   CT abdomen pelvis without contrast: 1. 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 Question fistulous communication between the rectum and sigmoid colon. 2. Cholelithiasis. 3. Atrophic kidneys with bilateral kidney stones. 4. Aortic atherosclerosis.  Assessment and Plan:  Acute on chronic anemia due to lower GI bleeding Suspect secondary to diverticular bleed Baseline hemoglobin is 7.5-9.0 Patient's hemoglobin is within range, no indication for transfusion at this time GI consulted, s/p colonoscopy done on 10/17, found to have diverticulosis, no active bleeding, no specimen grated.  Bleeding likely hemorrhoidal versus diverticular. 10/16 Hb 7.1, transfuse 1 unit of PRBC 10/17 Hb 8.5, posttransfusion. Check CBC in a.m., goal Hgb > 7.5   ESRD (end stage renal disease) (HCC) Home lanthanum 1000 to 2000 mg p.o. resume home dosing Nephrology consulted for dialysis, MWF schedule   Hypertension Amlodipine 10 mg daily, Coreg 25 mg p.o. twice daily with meals, hydralazine 75 mg every 8 hours, Imdur 60 mg every evening resumed Hydralazine 10 mg p.o. every 6  hours as needed for SBP greater than 175, 4 days ordered   Type 2 diabetes mellitus with other diabetic kidney complication (HCC) Not on home diabetic medication, presumed secondary to end-stage renal disease Patient had A1c of 4.7 on 08/24/2022 Insulin SSI with agents coverage not ordered on admission due to needlestick sparing for patient A.m. can order insulin SSI if patient's blood glucose continues to elevate   Morbid obesity (HCC) This complicates overall care and prognosis.    Restless leg syndrome Home ropinirole 1 mg nightly resumed   Neuropathic pain Home gabapentin 300 mg 3 times daily resumed   Body mass index is 34.53 kg/m.  Interventions:  Diet: Renal Diet DVT Prophylaxis: SCD, pharmacological prophylaxis contraindicated due to Rectal bleeding    Advance goals of care discussion: Full code  Family Communication: family was not present at bedside, at the time of interview.  The pt provided permission to discuss medical plan with the family. Opportunity was given to ask question and all questions were answered satisfactorily.   Disposition:  Pt is from home, admitted with rectal bleeding, still has low hemoglobin, may need colonoscopy, which precludes a safe discharge. Discharge to home, when cleared by GI.  Most likely discharge tomorrow a.m. after hemodialysis if H&H remained stable and no bleeding.  Subjective: Patient was seen and examined at bedside in the morning. No significant events overnight, patient had a good prep for the colonoscopy, seen before the procedure.  No bleeding noticed.  Still has lower abdominal cramping pain off and on 5/10, no any other active issues.    Physical Exam: General: NAD, lying comfortably Appear in no distress, affect appropriate Eyes: PERRLA ENT: Oral  Mucosa Clear, moist  Neck: no JVD,  Cardiovascular: S1 and S2 Present, no Murmur,  Respiratory: good respiratory effort, Bilateral Air entry equal and Decreased, no  Crackles, no wheezes Abdomen: Bowel Sound present, obese, soft and mild lower abdominal tenderness  Skin: no rashes Extremities: no Pedal edema, no calf tenderness Neurologic: without any new focal findings Gait not checked due to patient safety concerns  Vitals:   10/22/22 0401 10/22/22 0822 10/22/22 1246 10/22/22 1344  BP: (!) 167/81 (!) 189/88 (!) 158/77 (!) 154/98  Pulse: 88 88 72 77  Resp: 18 16 16 18   Temp: 98.1 F (36.7 C) (!) 97 F (36.1 C) 98.3 F (36.8 C) (!) 97.3 F (36.3 C)  TempSrc: Oral  Oral Temporal  SpO2: 98% 98% 100% 100%  Weight:      Height:        Intake/Output Summary (Last 24 hours) at 10/22/2022 1418 Last data filed at 10/21/2022 1946 Gross per 24 hour  Intake 360 ml  Output --  Net 360 ml   Filed Weights   10/19/22 1826 10/21/22 0923 10/21/22 1251  Weight: 108.1 kg 101.1 kg 100 kg    Data Reviewed: I have personally reviewed and interpreted daily labs, tele strips, imagings as discussed above. I reviewed all nursing notes, pharmacy notes, vitals, pertinent old records I have discussed plan of care as described above with RN and patient/family.  CBC: Recent Labs  Lab 10/19/22 1850 10/20/22 0606 10/20/22 1351 10/21/22 0508 10/22/22 0510  WBC 5.4 5.3  --  5.7 4.8  HGB 8.1* 7.5* 7.5* 7.1* 8.5*  HCT 26.6* 24.7* 24.2* 22.7* 27.0*  MCV 81.3 79.9*  --  77.5* 78.7*  PLT 228 237  --  234 196   Basic Metabolic Panel: Recent Labs  Lab 10/19/22 1850 10/20/22 0606 10/21/22 0508 10/21/22 1140 10/22/22 0510  NA 137 135 131*  --  132*  K 4.2 4.9 5.8* 3.9 4.7  CL 93* 92* 91*  --  92*  CO2 29 28 24   --  28  GLUCOSE 115* 104* 101*  --  108*  BUN 39* 46* 56*  --  37*  CREATININE 5.78* 6.66* 8.56*  --  7.09*  CALCIUM 8.2* 8.3* 8.6*  --  8.8*  MG  --   --  2.2  --  2.1  PHOS  --   --  8.9*  --  7.8*    Studies: No results found.  Scheduled Meds:  [MAR Hold] allopurinol  100 mg Oral QHS   [MAR Hold] amLODipine  10 mg Oral Daily   [MAR  Hold] ascorbic acid  500 mg Oral BID   [MAR Hold] carbidopa-levodopa  1 tablet Oral TID   [MAR Hold] carvedilol  25 mg Oral BID WC   [MAR Hold] DULoxetine  30 mg Oral Daily   [MAR Hold] gabapentin  300 mg Oral TID   [MAR Hold] hydrALAZINE  75 mg Oral Q8H   [MAR Hold] influenza vac split trivalent PF  0.5 mL Intramuscular Tomorrow-1000   [MAR Hold] isosorbide mononitrate  60 mg Oral QPM   [MAR Hold] lanthanum  2,000 mg Oral TID WC   [MAR Hold] multivitamin  1 tablet Oral QHS   [MAR Hold] pantoprazole  80 mg Oral BID   [MAR Hold] rOPINIRole  1 mg Oral QHS   Continuous Infusions:   PRN Meds: [MAR Hold] acetaminophen **OR** [MAR Hold] acetaminophen, [MAR Hold] ALPRAZolam, [MAR Hold] chlorpheniramine-HYDROcodone, [MAR Hold] hydrALAZINE, [MAR Hold]  HYDROmorphone (DILAUDID) injection, [  MAR Hold] hydrOXYzine, [MAR Hold] lanthanum **AND** [MAR Hold] lanthanum, [MAR Hold] polyethylene glycol, [MAR Hold] promethazine, [MAR Hold] traZODone  Time spent: 40 minutes  Author: Gillis Santa. MD [facility] 10/22/2022 2:18 PM  To reach On-call, see care teams to locate the attending and reach out to them via [email or web address]. If 7PM-7AM, please contact night-coverage If you still have difficulty reaching the attending provider, please page the [facility] (Director on Call) for [facility] on amion for assistance.

## 2022-10-22 NOTE — Progress Notes (Signed)
[facility]  ROUNDING NOTE   Subjective:   Patricia [name] is a 47 year old female with past medical history of hypertension, coronary artery disease, congestive heart failure, hyperlipidemia, end-stage renal disease on hemodialysis via right AV fistula now comes to the emergency room with rectal bleeding. She has been admitted for Rectal bleeding [K62.5] Lower GI bleed [K92.2] Acute on chronic anemia [D64.9]   Patient is known to our practice from previous admissions and receives outpatient dialysis treatments at [facility] on a MWF schedule, supervised by [facility] physicians).  Patient received a full dialysis treatment on Monday prior to ED arrival.  Patient states she has been having rectal bleeding since last week.  Reports as dark red blood with clots.  Has had colonoscopy in the past, states next is scheduled for November.  Does endorse history of hemorrhoids.  Denies abdominal pain or discomfort.  Aside from rectal bleeding, complains of generalized pruritus.  Labs on ED arrival include glucose 115, BUN 39, creatinine 5.78 with GFR 8, calcium 8.2, and hemoglobin 8.1.  CT abdomen pelvis shows diverticular disease without inflammatory process, cholelithiasis, and atrophic kidneys with bilateral kidney stones.  Patient seen earlier today prior to her procedure.  Doing fair.  Denies any acute shortness of breath. Colonoscopy later today showed diverticulosis of the colon, internal hemorrhoids.  Bleeding was likely viral versus diverticular.   Objective:  Vital signs in last 24 hours:  Temp:  [97 F (36.1 C)-98.5 F (36.9 C)] 97.2 F (36.2 C) (10/17 1432) Pulse Rate:  [72-88] 80 (10/17 1454) Resp:  [16-19] 18 (10/17 1521) BP: (135-189)/(60-98) 142/81 (10/17 1521) SpO2:  [98 %-100 %] 100 % (10/17 1521)  Weight change:  Filed Weights   10/19/22 1826 10/21/22 0923 10/21/22 1251  Weight: 108.1 kg 101.1 kg 100 kg     Intake/Output: I/O last 3 completed shifts: In: 410 [P.O.:60; Blood:300; IV Piggyback:50] Out: 1000 [Other:1000]   Intake/Output this shift:  Total I/O In: 400 [I.V.:400] Out: -   Physical Exam: General: NAD, resting comfortably  Head: Normocephalic, atraumatic. Moist oral mucosal membranes  Eyes: Anicteric  Lungs:  Clear to auscultation, normal effort  Heart: Regular rate and rhythm  Abdomen:  Soft, nontender, obese  Extremities: No peripheral edema.  Neurologic: Alert and oriented, moving all four extremities  Skin: No lesions  Access: Right AVF    Basic Metabolic Panel: Recent Labs  Lab 10/19/22 1850 10/20/22 0606 10/21/22 0508 10/21/22 1140 10/22/22 0510  NA 137 135 131*  --  132*  K 4.2 4.9 5.8* 3.9 4.7  CL 93* 92* 91*  --  92*  CO2 29 28 24   --  28  GLUCOSE 115* 104* 101*  --  108*  BUN 39* 46* 56*  --  37*  CREATININE 5.78* 6.66* 8.56*  --  7.09*  CALCIUM 8.2* 8.3* 8.6*  --  8.8*  MG  --   --  2.2  --  2.1  PHOS  --   --  8.9*  --  7.8*    Liver Function Tests: Recent Labs  Lab 10/19/22 1850  AST 19  ALT 8  ALKPHOS 83  BILITOT 0.6  PROT 7.6  ALBUMIN 3.6   Recent Labs  Lab 10/19/22 1850  LIPASE 39   No results for input(s): "AMMONIA" in the last 168 hours.  CBC: Recent Labs  Lab 10/19/22 1850 10/20/22 0606 10/20/22 1351 10/21/22 0508 10/22/22 0510  WBC 5.4 5.3  --  5.7 4.8  HGB  8.1* 7.5* 7.5* 7.1* 8.5*  HCT 26.6* 24.7* 24.2* 22.7* 27.0*  MCV 81.3 79.9*  --  77.5* 78.7*  PLT 228 237  --  234 196    Cardiac Enzymes: No results for input(s): "CKTOTAL", "CKMB", "CKMBINDEX", "TROPONINI" in the last 168 hours.  BNP: Invalid input(s): "POCBNP"  CBG: No results for input(s): "GLUCAP" in the last 168 hours.  Microbiology: Results for orders placed or performed during the hospital encounter of 10/19/22  MRSA Next Gen by PCR, Nasal     Status: None   Collection Time: 10/20/22  8:32 AM   Specimen: Nasal Mucosa; Nasal Swab   Result Value Ref Range Status   MRSA by PCR Next Gen NOT DETECTED NOT DETECTED Final    Comment: (NOTE) The GeneXpert MRSA Assay (FDA approved for NASAL specimens only), is one component of a comprehensive MRSA colonization surveillance program. It is not intended to diagnose MRSA infection nor to guide or monitor treatment for MRSA infections. Test performance is not FDA approved in patients less than 74 years old. Performed at [facility], [address] [place], [place] [address]     Coagulation Studies: Recent Labs    10/19/22 07-Nov-2047  LABPROT 15.0  INR 1.2    Urinalysis: No results for input(s): "COLORURINE", "LABSPEC", "PHURINE", "GLUCOSEU", "HGBUR", "BILIRUBINUR", "KETONESUR", "PROTEINUR", "UROBILINOGEN", "NITRITE", "LEUKOCYTESUR" in the last 72 hours.  Invalid input(s): "APPERANCEUR"    Imaging: No results found.   Medications:      allopurinol  100 mg Oral QHS   amLODipine  10 mg Oral Daily   ascorbic acid  500 mg Oral BID   carbidopa-levodopa  1 tablet Oral TID   carvedilol  25 mg Oral BID WC   DULoxetine  30 mg Oral Daily   gabapentin  300 mg Oral TID   hydrALAZINE  75 mg Oral Q8H   influenza vac split trivalent PF  0.5 mL Intramuscular Tomorrow-1000   isosorbide mononitrate  60 mg Oral QPM   lanthanum  2,000 mg Oral TID WC   multivitamin  1 tablet Oral QHS   pantoprazole  80 mg Oral BID   rOPINIRole  1 mg Oral QHS   acetaminophen **OR** acetaminophen, ALPRAZolam, chlorpheniramine-HYDROcodone, hydrALAZINE, HYDROmorphone (DILAUDID) injection, hydrOXYzine, lanthanum **AND** lanthanum, polyethylene glycol, promethazine, traZODone  Assessment/ Plan:  [name] is a 47 y.o.  female  with a past medical history of hypertension, coronary artery disease, congestive heart failure, hyperlipidemia, end-stage renal disease on hemodialysis via right AV fistula now comes to the emergency room with rectal bleeding and has been admitted  for Rectal bleeding [K62.5] Lower GI bleed [K92.2] Acute on chronic anemia [D64.9]  [facility]/MWF/right AVF  End-stage renal disease on hemodialysis with hyperkalemia.  -Potassium corrected after hemodialysis yesterday. -Plan for next hemodialysis tomorrow/Friday.  2. Anemia of chronic kidney disease and acute blood loss: microcytic. Lab Results  Component Value Date   HGB 8.5 (L) 10/22/2022    Patient receives Mircera at outpatient clinic.  Chief complaint involves rectal bleeding, GI evaluation in progress. Colonoscopy completed on 10/22/2022-diverticular disease and internal hemorrhoid  3. Secondary Hyperparathyroidism: with outpatient labs: PTH 413, phosphorus 7.5, calcium 9.0 on 10/12/22.   Lab Results  Component Value Date   PTH 160 (H) 04/06/2016   CALCIUM 8.8 (L) 10/22/2022   CAION 0.99 (L) 02/11/2022   PHOS 7.8 (H) 10/22/2022    Will continue to monitor bone minerals during this admission.  Patient prescribed Cinacalcet and calcitriol outpatient.  4.  Hypertension with chronic kidney disease.  Home regimen includes amlodipine, carvedilol, hydralazine, and metoprolol. Current regimen-pain, carvedilol hydralazine, isosorbide.   LOS: 3 Patricia [name] 10/17/20244:57 PM

## 2022-10-23 ENCOUNTER — Encounter: Payer: Self-pay | Admitting: Gastroenterology

## 2022-10-23 DIAGNOSIS — E1129 Type 2 diabetes mellitus with other diabetic kidney complication: Secondary | ICD-10-CM

## 2022-10-23 DIAGNOSIS — I10 Essential (primary) hypertension: Secondary | ICD-10-CM

## 2022-10-23 DIAGNOSIS — K625 Hemorrhage of anus and rectum: Secondary | ICD-10-CM | POA: Diagnosis not present

## 2022-10-23 DIAGNOSIS — G2581 Restless legs syndrome: Secondary | ICD-10-CM

## 2022-10-23 DIAGNOSIS — N186 End stage renal disease: Secondary | ICD-10-CM

## 2022-10-23 DIAGNOSIS — G63 Polyneuropathy in diseases classified elsewhere: Secondary | ICD-10-CM

## 2022-10-23 DIAGNOSIS — D649 Anemia, unspecified: Secondary | ICD-10-CM | POA: Diagnosis not present

## 2022-10-23 LAB — BASIC METABOLIC PANEL
Anion gap: 15 (ref 5–15)
BUN: 50 mg/dL — ABNORMAL HIGH (ref 6–20)
CO2: 25 mmol/L (ref 22–32)
Calcium: 8.6 mg/dL — ABNORMAL LOW (ref 8.9–10.3)
Chloride: 93 mmol/L — ABNORMAL LOW (ref 98–111)
Creatinine, Ser: 8.79 mg/dL — ABNORMAL HIGH (ref 0.44–1.00)
GFR, Estimated: 5 mL/min — ABNORMAL LOW (ref >60)
Glucose, Bld: 111 mg/dL — ABNORMAL HIGH (ref 70–99)
Potassium: 5 mmol/L (ref 3.5–5.1)
Sodium: 133 mmol/L — ABNORMAL LOW (ref 135–145)

## 2022-10-23 LAB — CBC
HCT: 25 % — ABNORMAL LOW (ref 36.0–46.0)
Hemoglobin: 8 g/dL — ABNORMAL LOW (ref 12.0–15.0)
MCH: 24.5 pg — ABNORMAL LOW (ref 26.0–34.0)
MCHC: 32 g/dL (ref 30.0–36.0)
MCV: 76.7 fL — ABNORMAL LOW (ref 80.0–100.0)
Platelets: 206 10*3/uL (ref 150–400)
RBC: 3.26 MIL/uL — ABNORMAL LOW (ref 3.87–5.11)
RDW: 17.6 % — ABNORMAL HIGH (ref 11.5–15.5)
WBC: 6.6 10*3/uL (ref 4.0–10.5)
nRBC: 0 % (ref 0.0–0.2)

## 2022-10-23 LAB — MAGNESIUM: Magnesium: 2.1 mg/dL (ref 1.7–2.4)

## 2022-10-23 LAB — HEPATITIS B SURFACE ANTIBODY, QUANTITATIVE: Hep B S AB Quant (Post): 43.8 m[IU]/mL

## 2022-10-23 LAB — PHOSPHORUS: Phosphorus: 10.1 mg/dL — ABNORMAL HIGH (ref 2.5–4.6)

## 2022-10-23 MED ORDER — LABETALOL HCL 5 MG/ML IV SOLN
10.0000 mg | Freq: Once | INTRAVENOUS | Status: AC
  Administered 2022-10-23: 10 mg via INTRAVENOUS

## 2022-10-23 MED ORDER — PENTAFLUOROPROP-TETRAFLUOROETH EX AERO
INHALATION_SPRAY | CUTANEOUS | Status: AC
  Filled 2022-10-23: qty 30

## 2022-10-23 MED ORDER — ACETAMINOPHEN 650 MG RE SUPP: 650.0000 mg | Freq: Four times a day (QID) | RECTAL | Status: DC | PRN

## 2022-10-23 MED ORDER — ACETAMINOPHEN 325 MG PO TABS
ORAL_TABLET | ORAL | Status: AC
  Filled 2022-10-23: qty 2

## 2022-10-23 MED ORDER — ACETAMINOPHEN 325 MG PO TABS
650.0000 mg | ORAL_TABLET | Freq: Four times a day (QID) | ORAL | Status: DC | PRN
  Administered 2022-10-23: 650 mg via ORAL

## 2022-10-23 MED ORDER — FERROUS SULFATE 325 (65 FE) MG PO TBEC
325.0000 mg | DELAYED_RELEASE_TABLET | Freq: Two times a day (BID) | ORAL | 3 refills | Status: AC
Start: 2022-10-23 — End: 2023-10-23

## 2022-10-23 MED ORDER — LABETALOL HCL 5 MG/ML IV SOLN
INTRAVENOUS | Status: AC
  Filled 2022-10-23: qty 4

## 2022-10-23 NOTE — Progress Notes (Signed)
1105: Tx paused due to low water pressure on the machine. Awaiting new set up.

## 2022-10-23 NOTE — Anesthesia Postprocedure Evaluation (Signed)
Anesthesia Post Note  Patient: Patricia [name]  Procedure(s) Performed: COLONOSCOPY  Patient location during evaluation: PACU Anesthesia Type: General Level of consciousness: awake Pain management: pain level controlled Vital Signs Assessment: post-procedure vital signs reviewed and stable Respiratory status: spontaneous breathing Cardiovascular status: stable Anesthetic complications: no   No notable events documented.   Last Vitals:  Vitals:   10/22/22 2307 10/23/22 0320  BP: (!) 150/75 (!) 152/76  Pulse: 76 92  Resp: 18 17  Temp: 36.9 C 37.1 C  SpO2: 100% 98%    Last Pain:  Vitals:   10/23/22 0405  TempSrc:   PainSc: 0-No pain                 VAN STAVEREN,Moana Munford

## 2022-10-23 NOTE — Discharge Summary (Signed)
Physician Discharge Summary   Patient: Patricia [name] MRN: [id number] DOB: [birth year]  Admit date:     10/19/2022  Discharge date: 10/23/22  Discharge Physician: Arnetha Courser   PCP: Rick Duff, PA-C   Recommendations at discharge:  Please obtain CBC and BMP on follow-up Patient likely had hemorrhoidal or diverticular bleed which has been stopped.  Hemoglobin stable. Continue with routine dialysis  Discharge Diagnoses: Principal Problem:   Acute on chronic anemia Active Problems:   ESRD (end stage renal disease) (HCC)   Hypertension   Type 2 diabetes mellitus with other diabetic kidney complication (HCC)   Gout   Morbid obesity (HCC)   Hypercholesterolemia   Lower GI bleed   Polyneuropathy associated with underlying disease (HCC)   Neuropathic pain   Restless leg syndrome   Rectal bleeding  Resolved Problems:   * No resolved hospital problems. [facility] Course: Patricia [name] is a 47 year old female with history of hypertension, depression, anxiety, restless leg syndrome, GERD, end-stage renal disease on hemodialysis, gastroparesis, who presents emergency department for chief concerns of rectal bleed.  Vitals in the ED showed temperature of 97.9, respiration rate of 18, heart rate of 99, blood pressure 142/95, SpO2 100% on room air.  Serum sodium is 137, potassium 4.2, chloride 93, bicarb 29, BUN of 39, serum creatinine of 5.78, EGFR of 8, WBC 5.4, hemoglobin 8.1, platelets of 228.  CT abdomen pelvis without contrast: 1. 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 Question fistulous communication between the rectum and sigmoid colon. 2. Cholelithiasis. 3. Atrophic kidneys with bilateral kidney stones. 4. Aortic atherosclerosis.   GI was consulted and patient underwent colonoscopy on 10/17, find to have diverticulosis, no active bleeding, no specimen taken.  Bleeding likely hemorrhoidal versus  diverticular. Patient did received 1 unit of PRBC when hemoglobin dropped to 7.1.  Currently stable at 8. Patient was also started on iron supplement and will continue with Aranesp with dialysis.  Patient received dialysis before discharge and will continue with her routine dialysis sessions.  She will continue with rest of her home medications and need to have a follow-up with her providers for further recommendations.   Consultants: Gastroenterology.  Nephrology Procedures performed: Colonoscopy Disposition: Home Diet recommendation:  Discharge Diet Orders (From admission, onward)     Start     Ordered   10/23/22 0000  Diet - low sodium heart healthy        10/23/22 1355           Cardiac and Carb modified diet DISCHARGE MEDICATION: Allergies as of 10/23/2022       Reactions   Nsaids Other (See Comments)   Cannot take due to Kidney disease/Kidney function Other Reaction(s): Unknown   Tolmetin Other (See Comments)   Cannot take due to Kidney Disease        Medication List     STOP taking these medications    benzonatate 200 MG capsule Commonly known as: TESSALON   chlorpheniramine-HYDROcodone 10-8 MG/5ML Commonly known as: TUSSIONEX   Multi-Vitamin tablet   pregabalin 75 MG capsule Commonly known as: LYRICA       TAKE these medications    acetaminophen 500 MG tablet Commonly known as: TYLENOL Take 1,000 mg by mouth every 6 (six) hours as needed for moderate pain.   allopurinol 100 MG tablet Commonly known as: ZYLOPRIM Take 100 mg by mouth at bedtime.   ALPRAZolam 1 MG tablet Commonly known as: XANAX Take 1  mg by mouth 3 (three) times daily as needed for anxiety or sleep.   amLODipine 10 MG tablet Commonly known as: NORVASC Take 1 tablet (10 mg total) by mouth daily.   ascorbic acid 500 MG tablet Commonly known as: VITAMIN C Take 1 tablet (500 mg total) by mouth 2 (two) times daily.   carbidopa-levodopa 25-100 MG tablet Commonly known  as: SINEMET IR TAKE 1 TABLET BY MOUTH THREE TIMES DAILY   carvedilol 25 MG tablet Commonly known as: COREG Take 1 tablet (25 mg total) by mouth 2 (two) times daily with a meal.   Diclofenac Sodium 1 % Crea 1 gram qid prn   DULoxetine 30 MG capsule Commonly known as: Cymbalta Take 1 capsule (30 mg total) by mouth daily.   eszopiclone 1 MG Tabs tablet Commonly known as: LUNESTA Take 1 mg by mouth at bedtime.   ferrous sulfate 325 (65 FE) MG EC tablet Take 1 tablet (325 mg total) by mouth 2 (two) times daily.   gabapentin 100 MG capsule Commonly known as: NEURONTIN TAKE 3 CAPSULES(300 MG) BY MOUTH THREE TIMES DAILY   gentamicin cream 0.1 % Commonly known as: GARAMYCIN Apply 1 Application topically 2 (two) times daily.   hydrALAZINE 25 MG tablet Commonly known as: APRESOLINE Take 3 tablets (75 mg total) by mouth every 8 (eight) hours.   hydrocortisone 2.5 % rectal cream Commonly known as: Anusol-HC Place 1 Application rectally 2 (two) times daily.   hydrOXYzine 25 MG tablet Commonly known as: ATARAX Take 25 mg by mouth every 6 (six) hours as needed for itching.   isosorbide mononitrate 60 MG 24 hr tablet Commonly known as: IMDUR Take 1 tablet (60 mg total) by mouth every evening. Skip the dose if systolic BP less than 140 mmHg   lanthanum 1000 MG chewable tablet Commonly known as: FOSRENOL Chew 1,000-2,000 mg by mouth See admin instructions. Take 2 tablets (2000mg ) by mouth three times daily with meals and take 1 tablet (1000mg ) by mouth twice daily with snacks   lidocaine-prilocaine cream Commonly known as: EMLA 1gram tid prn   multivitamin Tabs tablet Take 1 tablet by mouth at bedtime.   omeprazole 40 MG capsule Commonly known as: PRILOSEC Take 1 capsule (40 mg total) by mouth daily. What changed: when to take this   polyethylene glycol 17 g packet Commonly known as: MiraLax Take 17 g by mouth daily. What changed:  when to take this reasons to take  this   promethazine 25 MG tablet Commonly known as: PHENERGAN Take 25 mg by mouth 3 (three) times daily as needed for nausea or vomiting.   rOPINIRole 1 MG tablet Commonly known as: REQUIP Take 1 tablet (1 mg total) by mouth at bedtime.   Vilazodone HCl 10 MG Tabs Commonly known as: VIIBRYD Take 10 mg by mouth daily.   zinc sulfate 220 (50 Zn) MG capsule Take 1 capsule (220 mg total) by mouth daily.        Follow-up Information     Rick Duff, PA-C. Schedule an appointment as soon as possible for a visit in 1 week(s).   Specialty: Physician Assistant Contact information: [address] [place] [address] [phone]                Discharge Exam: Ceasar Mons Weights   10/19/22 1826 10/21/22 9147 10/21/22 1251  Weight: 108.1 kg 101.1 kg 100 kg   General.  Obese lady, in no acute distress. Pulmonary.  Lungs clear bilaterally, normal  respiratory effort. CV.  Regular rate and rhythm, no JVD, rub or murmur. Abdomen.  Soft, nontender, nondistended, BS positive. CNS.  Alert and oriented .  No focal neurologic deficit. Extremities.  No edema, no cyanosis, pulses intact and symmetrical. Psychiatry.  Judgment and insight appears normal.   Condition at discharge: stable  The results of significant diagnostics from this hospitalization (including imaging, microbiology, ancillary and laboratory) are listed below for reference.   Imaging Studies: CT ABDOMEN PELVIS WO CONTRAST  Result Date: 10/20/2022 CLINICAL DATA:  Rectal bleeding EXAM: CT ABDOMEN AND PELVIS WITHOUT CONTRAST TECHNIQUE: Multidetector CT imaging of the abdomen and pelvis was performed following the standard protocol without IV contrast. RADIATION DOSE REDUCTION: This exam was performed according to the departmental dose-optimization program which includes automated exposure control, adjustment of the mA and/or kV according to patient size and/or use of iterative reconstruction technique.  COMPARISON:  CT 02/14/2020 FINDINGS: Lower chest: Lung bases demonstrate no acute airspace disease. Hepatobiliary: Small gallstones. No biliary dilatation. No focal hepatic abnormality prior Pancreas: Unremarkable. No pancreatic ductal dilatation or surrounding inflammatory changes. Spleen: Normal in size without focal abnormality. Adrenals/Urinary Tract: Adrenal glands are normal. Atrophic kidneys without hydronephrosis. Cysts and subcentimeter hypodensities too small to further characterize, no imaging follow-up is recommended. Multiple kidney stones, measuring up to 5 mm on the left and 6 mm on the right. The bladder is unremarkable Stomach/Bowel: The stomach is nonenlarged. No dilated small bowel. 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 Question fistulous communication between the rectum and sigmoid colon, series 2 image 75 Vascular/Lymphatic: Moderate aortic atherosclerosis. No aneurysm. No suspicious lymph nodes Reproductive: Status post hysterectomy. No adnexal masses. Other: Negative for pelvic effusion or free air Musculoskeletal: No acute or suspicious osseous abnormality IMPRESSION: 1. 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 Question fistulous communication between the rectum and sigmoid colon. 2. Cholelithiasis. 3. Atrophic kidneys with bilateral kidney stones. 4. Aortic atherosclerosis. Aortic Atherosclerosis (ICD10-I70.0). Electronically Signed   By: Jasmine Pang M.D.   On: 10/20/2022 00:02   MM 3D SCREENING MAMMOGRAM BILATERAL BREAST  Result Date: 10/07/2022 CLINICAL DATA:  Screening. EXAM: DIGITAL SCREENING BILATERAL MAMMOGRAM WITH TOMOSYNTHESIS AND CAD TECHNIQUE: Bilateral screening digital craniocaudal and mediolateral oblique mammograms were obtained. Bilateral screening digital breast tomosynthesis was performed. The images were evaluated with computer-aided detection. COMPARISON:   None available. ACR Breast Density Category a: The breasts are almost entirely fatty. FINDINGS: There are no findings suspicious for malignancy. IMPRESSION: No mammographic evidence of malignancy. A result letter of this screening mammogram will be mailed directly to the patient. RECOMMENDATION: Screening mammogram in one year. (Code:[id number]-B-01Y) BI-RADS CATEGORY  1: Negative. Electronically Signed   By: Edwin Cap M.D.   On: 10/07/2022 15:50    Microbiology: Results for orders placed or performed during the hospital encounter of 10/19/22  MRSA Next Gen by PCR, Nasal     Status: None   Collection Time: 10/20/22  8:32 AM   Specimen: Nasal Mucosa; Nasal Swab  Result Value Ref Range Status   MRSA by PCR Next Gen NOT DETECTED NOT DETECTED Final    Comment: (NOTE) The GeneXpert MRSA Assay (FDA approved for NASAL specimens only), is one component of a comprehensive MRSA colonization surveillance program. It is not intended to diagnose MRSA infection nor to guide or monitor treatment for MRSA infections. Test performance is not FDA approved in patients less than 76 years old. Performed at [facility], [phone]  [address] [place], [place] [address]     Labs: CBC: Recent Labs  Lab 10/19/22 1850 10/20/22 0606 10/20/22 1351 10/21/22 0508 10/22/22 0510 10/23/22 0517  WBC 5.4 5.3  --  5.7 4.8 6.6  HGB 8.1* 7.5* 7.5* 7.1* 8.5* 8.0*  HCT 26.6* 24.7* 24.2* 22.7* 27.0* 25.0*  MCV 81.3 79.9*  --  77.5* 78.7* 76.7*  PLT 228 237  --  234 196 206   Basic Metabolic Panel: Recent Labs  Lab 10/19/22 1850 10/20/22 0606 10/21/22 0508 10/21/22 1140 10/22/22 0510 10/23/22 0517  NA 137 135 131*  --  132* 133*  K 4.2 4.9 5.8* 3.9 4.7 5.0  CL 93* 92* 91*  --  92* 93*  CO2 29 28 24   --  28 25  GLUCOSE 115* 104* 101*  --  108* 111*  BUN 39* 46* 56*  --  37* 50*  CREATININE 5.78* 6.66* 8.56*  --  7.09* 8.79*  CALCIUM 8.2* 8.3* 8.6*  --  8.8* 8.6*  MG  --   --  2.2  --  2.1 2.1  PHOS  --    --  8.9*  --  7.8* 10.1*   Liver Function Tests: Recent Labs  Lab 10/19/22 1850  AST 19  ALT 8  ALKPHOS 83  BILITOT 0.6  PROT 7.6  ALBUMIN 3.6   CBG: No results for input(s): "GLUCAP" in the last 168 hours.  Discharge time spent: greater than 30 minutes.  This record has been created using Conservation officer, historic buildings. Errors have been sought and corrected,but may not always be located. Such creation errors do not reflect on the standard of care.   Signed: Arnetha Courser, MD [facility] 10/23/2022

## 2022-10-23 NOTE — TOC Progression Note (Signed)
Transition of Care [facility]) - Progression Note    Patient Details  Name: Patricia [name] MRN: [id number] Date of Birth: 03-22-1975  [facility] CM/SW Contact  Truddie Hidden, RN Phone Number: 10/23/2022, 12:28 PM  Clinical Narrative:    Elnita Maxwell from [facility] notified of likely d/c today.   Expected Discharge Plan: Home w Home Health Services Barriers to Discharge: Continued Medical Work up  Expected Discharge Plan and Services       Living arrangements for the past 2 months: Single Family Home                                       Social Determinants of Health (SDOH) Interventions SDOH Screenings   Food Insecurity: No Food Insecurity (10/20/2022)  Housing: Low Risk  (10/20/2022)  Transportation Needs: No Transportation Needs (10/20/2022)  Utilities: Not At Risk (10/20/2022)  Financial Resource Strain: Low Risk  (09/04/2019)   Received from [facility], [facility]  Physical Activity: Insufficiently Active (09/04/2019)   Received from [facility], [facility]  Social Connections: Unknown (05/10/2021)   Received from [facility], [facility]  Stress: Stress Concern Present (09/04/2019)   Received from [facility], [facility]  Tobacco Use: Low Risk  (10/22/2022)  Health Literacy: Low Risk  (07/04/2019)   Received from [facility], [facility], [facility]    Readmission Risk Interventions    10/21/2022    9:30 AM 08/12/2022    9:33 AM 07/23/2022    9:17 AM  Readmission Risk Prevention Plan  Transportation Screening Complete Complete Complete  Medication Review (RN Care Manager) Complete Complete Complete  PCP or Specialist appointment within 3-5 days of discharge  Complete   HRI or Home Care Consult Complete Complete   SW Recovery Care/Counseling Consult Complete  Complete  Palliative Care Screening Not Applicable  Not Applicable  Skilled Nursing Facility Not Applicable Not Applicable Not Applicable

## 2022-10-23 NOTE — Plan of Care (Signed)
[facility] Tonsillectomy/Adenoidectomy, Postoperative PEDS care plan entered in error.

## 2022-10-23 NOTE — Progress Notes (Signed)
[facility]  ROUNDING NOTE   Subjective:   Patricia [name] is a 47 year old female with past medical history of hypertension, coronary artery disease, congestive heart failure, hyperlipidemia, end-stage renal disease on hemodialysis via right AV fistula now comes to the emergency room with rectal bleeding. She has been admitted for Rectal bleeding [K62.5] Lower GI bleed [K92.2] Acute on chronic anemia [D64.9]   Patient is known to our practice from previous admissions and receives outpatient dialysis treatments at [facility] on a MWF schedule, supervised by [facility] physicians).    Patient seen and evaluated during dialysis   HEMODIALYSIS FLOWSHEET:  Blood Flow Rate (mL/min): 400 mL/min Arterial Pressure (mmHg): -160 mmHg Venous Pressure (mmHg): 260 mmHg TMP (mmHg): 8 mmHg Ultrafiltration Rate (mL/min): 471 mL/min Dialysate Flow Rate (mL/min): 299 ml/min  Resting comfortably during treatment   Objective:  Vital signs in last 24 hours:  Temp:  [97.2 F (36.2 C)-98.8 F (37.1 C)] 98.3 F (36.8 C) (10/18 0905) Pulse Rate:  [72-93] 74 (10/18 1200) Resp:  [12-25] 12 (10/18 1200) BP: (135-208)/(60-102) 171/80 (10/18 1200) SpO2:  [95 %-100 %] 100 % (10/18 1200)  Weight change:  Filed Weights   10/19/22 1826 10/21/22 0923 10/21/22 1251  Weight: 108.1 kg 101.1 kg 100 kg    Intake/Output: I/O last 3 completed shifts: In: 460 [P.O.:60; I.V.:400] Out: -    Intake/Output this shift:  Total I/O In: -  Out: 100 [Other:100]  Physical Exam: General: NAD, resting comfortably  Head: Normocephalic, atraumatic. Moist oral mucosal membranes  Eyes: Anicteric  Lungs:  Clear to auscultation, normal effort  Heart: Regular rate and rhythm  Abdomen:  Soft, nontender, obese  Extremities: No peripheral edema.  Neurologic: Alert and oriented, moving all four extremities  Skin: No lesions  Access: Right AVF    Basic Metabolic  Panel: Recent Labs  Lab 10/19/22 1850 10/20/22 0606 10/21/22 0508 10/21/22 1140 10/22/22 0510 10/23/22 0517  NA 137 135 131*  --  132* 133*  K 4.2 4.9 5.8* 3.9 4.7 5.0  CL 93* 92* 91*  --  92* 93*  CO2 29 28 24   --  28 25  GLUCOSE 115* 104* 101*  --  108* 111*  BUN 39* 46* 56*  --  37* 50*  CREATININE 5.78* 6.66* 8.56*  --  7.09* 8.79*  CALCIUM 8.2* 8.3* 8.6*  --  8.8* 8.6*  MG  --   --  2.2  --  2.1 2.1  PHOS  --   --  8.9*  --  7.8* 10.1*    Liver Function Tests: Recent Labs  Lab 10/19/22 1850  AST 19  ALT 8  ALKPHOS 83  BILITOT 0.6  PROT 7.6  ALBUMIN 3.6   Recent Labs  Lab 10/19/22 1850  LIPASE 39   No results for input(s): "AMMONIA" in the last 168 hours.  CBC: Recent Labs  Lab 10/19/22 1850 10/20/22 0606 10/20/22 1351 10/21/22 0508 10/22/22 0510 10/23/22 0517  WBC 5.4 5.3  --  5.7 4.8 6.6  HGB 8.1* 7.5* 7.5* 7.1* 8.5* 8.0*  HCT 26.6* 24.7* 24.2* 22.7* 27.0* 25.0*  MCV 81.3 79.9*  --  77.5* 78.7* 76.7*  PLT 228 237  --  234 196 206    Cardiac Enzymes: No results for input(s): "CKTOTAL", "CKMB", "CKMBINDEX", "TROPONINI" in the last 168 hours.  BNP: Invalid input(s): "POCBNP"  CBG: No results for input(s): "GLUCAP" in the last 168 hours.  Microbiology: Results for orders placed or performed during  the hospital encounter of 10/19/22  MRSA Next Gen by PCR, Nasal     Status: None   Collection Time: 10/20/22  8:32 AM   Specimen: Nasal Mucosa; Nasal Swab  Result Value Ref Range Status   MRSA by PCR Next Gen NOT DETECTED NOT DETECTED Final    Comment: (NOTE) The GeneXpert MRSA Assay (FDA approved for NASAL specimens only), is one component of a comprehensive MRSA colonization surveillance program. It is not intended to diagnose MRSA infection nor to guide or monitor treatment for MRSA infections. Test performance is not FDA approved in patients less than 12 years old. Performed at [facility], [address] [place], [place]  [address]     Coagulation Studies: No results for input(s): "LABPROT", "INR" in the last 72 hours.   Urinalysis: No results for input(s): "COLORURINE", "LABSPEC", "PHURINE", "GLUCOSEU", "HGBUR", "BILIRUBINUR", "KETONESUR", "PROTEINUR", "UROBILINOGEN", "NITRITE", "LEUKOCYTESUR" in the last 72 hours.  Invalid input(s): "APPERANCEUR"    Imaging: No results found.   Medications:      allopurinol  100 mg Oral QHS   amLODipine  10 mg Oral Daily   ascorbic acid  500 mg Oral BID   carbidopa-levodopa  1 tablet Oral TID   carvedilol  25 mg Oral BID WC   DULoxetine  30 mg Oral Daily   gabapentin  300 mg Oral TID   hydrALAZINE  75 mg Oral Q8H   influenza vac split trivalent PF  0.5 mL Intramuscular Tomorrow-1000   isosorbide mononitrate  60 mg Oral QPM   lanthanum  2,000 mg Oral TID WC   multivitamin  1 tablet Oral QHS   pantoprazole  80 mg Oral BID   rOPINIRole  1 mg Oral QHS   acetaminophen **OR** acetaminophen, ALPRAZolam, chlorpheniramine-HYDROcodone, hydrALAZINE, HYDROmorphone (DILAUDID) injection, hydrOXYzine, lanthanum **AND** lanthanum, polyethylene glycol, promethazine, traZODone  Assessment/ Plan:  [name] is a 47 y.o.  female  with a past medical history of hypertension, coronary artery disease, congestive heart failure, hyperlipidemia, end-stage renal disease on hemodialysis via right AV fistula now comes to the emergency room with rectal bleeding and has been admitted for Rectal bleeding [K62.5] Lower GI bleed [K92.2] Acute on chronic anemia [D64.9]  [facility]/MWF/right AVF  End-stage renal disease on hemodialysis with hyperkalemia.  - Receiving dialysis today, UF 0.5L as tolerated.   - Next treatment scheduled for Monday  2. Anemia of chronic kidney disease and acute blood loss: microcytic. Lab Results  Component Value Date   HGB 8.0 (L) 10/23/2022    Patient receives Mircera at outpatient clinic.  Chief complaint involves rectal  bleeding, GI evaluation in progress. Colonoscopy completed on 10/22/2022-diverticular disease and internal hemorrhoid Slight drop in Hgb, will continue to monitor.  3. Secondary Hyperparathyroidism: with outpatient labs: PTH 413, phosphorus 7.5, calcium 9.0 on 10/12/22.   Lab Results  Component Value Date   PTH 160 (H) 04/06/2016   CALCIUM 8.6 (L) 10/23/2022   CAION 0.99 (L) 02/11/2022   PHOS 10.1 (H) 10/23/2022    Will continue to monitor bone minerals during this admission.  Patient prescribed Cinacalcet and calcitriol outpatient.  4.  Hypertension with chronic kidney disease.  Home regimen includes amlodipine, carvedilol, hydralazine, and metoprolol. Current regimen includes amlodipine, carvedilol hydralazine, and isosorbide.   LOS: 4 Demarrion Meiklejohn 10/18/202412:15 PM

## 2022-10-23 NOTE — Plan of Care (Signed)
  Problem: Education: Goal: Knowledge of General Education information will improve Description: Including pain rating scale, medication(s)/side effects and non-pharmacologic comfort measures Outcome: Adequate for Discharge   Problem: Health Behavior/Discharge Planning: Goal: Ability to manage health-related needs will improve Outcome: Adequate for Discharge   Problem: Clinical Measurements: Goal: Ability to maintain clinical measurements within normal limits will improve Outcome: Adequate for Discharge Goal: Will remain free from infection Outcome: Adequate for Discharge Goal: Diagnostic test results will improve Outcome: Adequate for Discharge Goal: Respiratory complications will improve Outcome: Adequate for Discharge Goal: Cardiovascular complication will be avoided Outcome: Adequate for Discharge   Problem: Activity: Goal: Risk for activity intolerance will decrease Outcome: Adequate for Discharge   Problem: Nutrition: Goal: Adequate nutrition will be maintained Outcome: Adequate for Discharge   Problem: Coping: Goal: Level of anxiety will decrease Outcome: Adequate for Discharge   Problem: Elimination: Goal: Will not experience complications related to bowel motility Outcome: Adequate for Discharge Goal: Will not experience complications related to urinary retention Outcome: Adequate for Discharge   Problem: Pain Managment: Goal: General experience of comfort will improve Outcome: Adequate for Discharge   Problem: Safety: Goal: Ability to remain free from injury will improve Outcome: Adequate for Discharge   Problem: Skin Integrity: Goal: Risk for impaired skin integrity will decrease Outcome: Adequate for Discharge   Problem: Education: Goal: Ability to identify signs and symptoms of gastrointestinal bleeding will improve Outcome: Adequate for Discharge   Problem: Bowel/Gastric: Goal: Will show no signs and symptoms of gastrointestinal bleeding Outcome:  Adequate for Discharge   Problem: Fluid Volume: Goal: Will show no signs and symptoms of excessive bleeding Outcome: Adequate for Discharge   Problem: Clinical Measurements: Goal: Complications related to the disease process, condition or treatment will be avoided or minimized Outcome: Adequate for Discharge   Problem: Education: Goal: Understanding of post-operative needs will improve Outcome: Adequate for Discharge Goal: Individualized Educational Video(s) Outcome: Adequate for Discharge   Problem: Clinical Measurements: Goal: Postoperative complications will be avoided or minimized Outcome: Adequate for Discharge   Problem: Respiratory: Goal: Will regain and/or maintain adequate ventilation Outcome: Adequate for Discharge

## 2022-10-25 ENCOUNTER — Other Ambulatory Visit: Payer: Self-pay | Admitting: Neurology

## 2022-11-14 ENCOUNTER — Other Ambulatory Visit: Payer: Self-pay | Admitting: Neurology

## 2022-11-17 ENCOUNTER — Other Ambulatory Visit: Payer: Self-pay | Admitting: Neurology

## 2022-11-24 ENCOUNTER — Encounter: Admission: RE | Payer: Self-pay | Source: Home / Self Care

## 2022-11-24 ENCOUNTER — Ambulatory Visit: Admission: RE | Admit: 2022-11-24 | Payer: 59 | Source: Home / Self Care

## 2022-11-24 SURGERY — COLONOSCOPY WITH PROPOFOL
Anesthesia: General

## 2022-12-09 ENCOUNTER — Telehealth: Payer: Self-pay | Admitting: Neurology

## 2022-12-09 NOTE — Telephone Encounter (Signed)
Pt has bad debt, sent MyChart msg asking her to call billing to pay before we can schedule.  Once balance has been paid, are we ok to restart injections? She has not had one since 04/2022.

## 2022-12-09 NOTE — Telephone Encounter (Signed)
Patient called stating she would like a Botox appt.  She also stated she would like a prescription to help with her Neuropathy?

## 2022-12-09 NOTE — Telephone Encounter (Signed)
Patricia [name], [place] Nurse Tech SignedYesterday   Addend   Copy   Pt has bad debt, sent MyChart msg asking her to call billing to pay before we can schedule.   Once balance has been paid, are we ok to restart injections? She has not had one since 04/2022.       Is okay to resume xeomin injection after her balance is paid off  She was getting prescription of gabapentin 100 mg 3 tablets 3 times a day in November 2024, if gabapentin helps her, she may try higher dose, such as 4 to 600 mg 3 times a day

## 2022-12-10 ENCOUNTER — Encounter: Payer: Self-pay | Admitting: Neurology

## 2022-12-16 ENCOUNTER — Other Ambulatory Visit: Payer: Self-pay

## 2022-12-16 ENCOUNTER — Emergency Department
Admission: EM | Admit: 2022-12-16 | Discharge: 2022-12-16 | Disposition: A | Discharge status: Home / Self Care | Payer: 59 | Attending: Emergency Medicine | Admitting: Emergency Medicine

## 2022-12-16 DIAGNOSIS — D649 Anemia, unspecified: Secondary | ICD-10-CM | POA: Insufficient documentation

## 2022-12-16 DIAGNOSIS — R0600 Dyspnea, unspecified: Secondary | ICD-10-CM | POA: Diagnosis present

## 2022-12-16 DIAGNOSIS — N186 End stage renal disease: Secondary | ICD-10-CM | POA: Diagnosis not present

## 2022-12-16 DIAGNOSIS — Z992 Dependence on renal dialysis: Secondary | ICD-10-CM | POA: Insufficient documentation

## 2022-12-16 LAB — COMPREHENSIVE METABOLIC PANEL
ALT: 9 U/L (ref 0–44)
AST: 23 U/L (ref 15–41)
Albumin: 3.8 g/dL (ref 3.5–5.0)
Alkaline Phosphatase: 51 U/L (ref 38–126)
Anion gap: 15 (ref 5–15)
BUN: 17 mg/dL (ref 6–20)
CO2: 28 mmol/L (ref 22–32)
Calcium: 7.9 mg/dL — ABNORMAL LOW (ref 8.9–10.3)
Chloride: 91 mmol/L — ABNORMAL LOW (ref 98–111)
Creatinine, Ser: 3.42 mg/dL — ABNORMAL HIGH (ref 0.44–1.00)
GFR, Estimated: 16 mL/min — ABNORMAL LOW (ref >60)
Glucose, Bld: 93 mg/dL (ref 70–99)
Potassium: 3.2 mmol/L — ABNORMAL LOW (ref 3.5–5.1)
Sodium: 134 mmol/L — ABNORMAL LOW (ref 135–145)
Total Bilirubin: 0.5 mg/dL (ref <1.2)
Total Protein: 7.8 g/dL (ref 6.5–8.1)

## 2022-12-16 LAB — CBC
HCT: 17.4 % — ABNORMAL LOW (ref 36.0–46.0)
Hemoglobin: 5.5 g/dL — ABNORMAL LOW (ref 12.0–15.0)
MCH: 24.7 pg — ABNORMAL LOW (ref 26.0–34.0)
MCHC: 31.6 g/dL (ref 30.0–36.0)
MCV: 78 fL — ABNORMAL LOW (ref 80.0–100.0)
Platelets: 317 10*3/uL (ref 150–400)
RBC: 2.23 MIL/uL — ABNORMAL LOW (ref 3.87–5.11)
RDW: 19.9 % — ABNORMAL HIGH (ref 11.5–15.5)
WBC: 5.9 10*3/uL (ref 4.0–10.5)
nRBC: 0 % (ref 0.0–0.2)

## 2022-12-16 LAB — PREPARE RBC (CROSSMATCH)

## 2022-12-16 MED ORDER — GABAPENTIN 300 MG PO CAPS
300.0000 mg | ORAL_CAPSULE | Freq: Once | ORAL | Status: AC
Start: 2022-12-16 — End: 2022-12-16
  Administered 2022-12-16: 300 mg via ORAL
  Filled 2022-12-16: qty 1

## 2022-12-16 MED ORDER — SODIUM CHLORIDE 0.9 % IV SOLN
10.0000 mL/h | Freq: Once | INTRAVENOUS | Status: AC
  Administered 2022-12-16: 10 mL/h via INTRAVENOUS

## 2022-12-16 NOTE — ED Triage Notes (Signed)
Pt to ED sent from doctor for hgb <6. Not visible in chart. Denies dark stool or any bleeding.

## 2022-12-16 NOTE — ED Provider Notes (Signed)
[facility] Provider Note   Event Date/Time   First MD Initiated Contact with Patient 12/16/22 1429     (approximate) History  abnormal labs  HPI Patricia [name] is a 47 y.o. female with a past medical history of end-stage renal disease on dialysis M/W/F who presents from dialysis today after being told that her hemoglobin was 5.5.  Patient does endorse mild dyspnea on exertion but denies any other complaints at this time.  Patient states that she has had multiple transfusions in the past and request that she not be admitted and rather get her transfusions to be discharged. ROS: Patient currently denies any vision changes, tinnitus, difficulty speaking, facial droop, sore throat, chest pain, shortness of breath, abdominal pain, nausea/vomiting/diarrhea, dysuria, or weakness/numbness/paresthesias in any extremity   Physical Exam  Triage Vital Signs: ED Triage Vitals  Encounter Vitals Group     BP 12/16/22 1141 (!) 142/77     Systolic BP Percentile --      Diastolic BP Percentile --      Pulse Rate 12/16/22 1141 96     Resp 12/16/22 1141 20     Temp 12/16/22 1141 98.3 F (36.8 C)     Temp src --      SpO2 12/16/22 1141 100 %     Weight 12/16/22 1142 240 lb (108.9 kg)     Height 12/16/22 1142 5\' 7"  (1.702 m)     Head Circumference --      Peak Flow --      Pain Score 12/16/22 1142 0     Pain Loc --      Pain Education --      Exclude from Growth Chart --    Most recent vital signs: Vitals:   12/16/22 1141  BP: (!) 142/77  Pulse: 96  Resp: 20  Temp: 98.3 F (36.8 C)  SpO2: 100%   General: Awake, oriented x4. CV:  Good peripheral perfusion.  Resp:  Normal effort.  Abd:  No distention.  Other:  Middle-aged obese African-American female resting comfortably in no acute distress ED Results / Procedures / Treatments  Labs (all labs ordered are listed, but only abnormal results are displayed) Labs Reviewed  CBC - Abnormal; Notable for the  following components:      Result Value   RBC 2.23 (*)    Hemoglobin 5.5 (*)    HCT 17.4 (*)    MCV 78.0 (*)    MCH 24.7 (*)    RDW 19.9 (*)    All other components within normal limits  COMPREHENSIVE METABOLIC PANEL - Abnormal; Notable for the following components:   Sodium 134 (*)    Potassium 3.2 (*)    Chloride 91 (*)    Creatinine, Ser 3.42 (*)    Calcium 7.9 (*)    GFR, Estimated 16 (*)    All other components within normal limits  TYPE AND SCREEN  PREPARE RBC (CROSSMATCH)   PROCEDURES: Critical Care performed: Yes, see critical care procedure note(s) .1-3 Lead EKG Interpretation  Performed by: Merwyn Katos, MD Authorized by: Merwyn Katos, MD     Interpretation: normal     ECG rate:  91   ECG rate assessment: normal     Rhythm: sinus rhythm     Ectopy: none     Conduction: normal   CRITICAL CARE Performed by: Merwyn Katos  Total critical care time: 31 minutes  Critical care time was exclusive of separately billable  procedures and treating other patients.  Critical care was necessary to treat or prevent imminent or life-threatening deterioration.  Critical care was time spent personally by me on the following activities: development of treatment plan with patient and/or surrogate as well as nursing, discussions with consultants, evaluation of patient's response to treatment, examination of patient, obtaining history from patient or surrogate, ordering and performing treatments and interventions, ordering and review of laboratory studies, ordering and review of radiographic studies, pulse oximetry and re-evaluation of patient's condition.  MEDICATIONS ORDERED IN ED: Medications  0.9 %  sodium chloride infusion (has no administration in time range)   IMPRESSION / MDM / ASSESSMENT AND PLAN / ED COURSE  I reviewed the triage vital signs and the nursing notes.                             The patient is on the cardiac monitor to evaluate for evidence of  arrhythmia and/or significant heart rate changes. Patient's presentation is most consistent with acute presentation with potential threat to life or bodily function. Patient presents for symptomatic anemia secondary to anemia of chronic renal disease. Patient with known cause of anemia and follow up scheduled.  Plan to transfuse 2 units prior to discharge.  Care of this patient will be signed out to the oncoming physician at the end of my shift.  All pertinent patient information conveyed and all questions answered.  All further care and disposition decisions will be made by the oncoming physician.   FINAL CLINICAL IMPRESSION(S) / ED DIAGNOSES   Final diagnoses:  Symptomatic anemia   Rx / DC Orders   ED Discharge Orders     None      Note:  This document was prepared using Dragon voice recognition software and may include unintentional dictation errors.   Merwyn Katos, MD 12/16/22 [phone]

## 2022-12-16 NOTE — ED Provider Notes (Signed)
Infusion completed, hemodynamics stable, patient stable, discharged with follow-up.   Pilar Jarvis, MD 12/16/22 2133

## 2022-12-17 LAB — BPAM RBC
Blood Product Expiration Date: 202412192359
Blood Product Expiration Date: 202412192359
ISSUE DATE / TIME: 202412111555
ISSUE DATE / TIME: 202412111826
Unit Type and Rh: 9500
Unit Type and Rh: 9500

## 2022-12-17 LAB — TYPE AND SCREEN
ABO/RH(D): O NEG
Antibody Screen: NEGATIVE
Unit division: 0
Unit division: 0

## 2022-12-24 ENCOUNTER — Other Ambulatory Visit: Payer: Self-pay | Admitting: Neurology

## 2022-12-31 ENCOUNTER — Other Ambulatory Visit: Payer: Self-pay

## 2022-12-31 ENCOUNTER — Observation Stay (HOSPITAL_COMMUNITY)
Admission: EM | Admit: 2022-12-31 | Discharge: 2023-01-01 | Disposition: A | Discharge status: Home / Self Care | Payer: 59 | Attending: Internal Medicine | Admitting: Internal Medicine

## 2022-12-31 ENCOUNTER — Encounter (HOSPITAL_COMMUNITY): Payer: Self-pay | Admitting: Internal Medicine

## 2022-12-31 ENCOUNTER — Inpatient Hospital Stay (HOSPITAL_COMMUNITY): Payer: 59

## 2022-12-31 DIAGNOSIS — E1122 Type 2 diabetes mellitus with diabetic chronic kidney disease: Secondary | ICD-10-CM | POA: Insufficient documentation

## 2022-12-31 DIAGNOSIS — Z8673 Personal history of transient ischemic attack (TIA), and cerebral infarction without residual deficits: Secondary | ICD-10-CM | POA: Insufficient documentation

## 2022-12-31 DIAGNOSIS — E1140 Type 2 diabetes mellitus with diabetic neuropathy, unspecified: Secondary | ICD-10-CM | POA: Diagnosis not present

## 2022-12-31 DIAGNOSIS — F411 Generalized anxiety disorder: Secondary | ICD-10-CM | POA: Diagnosis not present

## 2022-12-31 DIAGNOSIS — G2581 Restless legs syndrome: Secondary | ICD-10-CM | POA: Diagnosis present

## 2022-12-31 DIAGNOSIS — D649 Anemia, unspecified: Secondary | ICD-10-CM | POA: Diagnosis present

## 2022-12-31 DIAGNOSIS — I10 Essential (primary) hypertension: Secondary | ICD-10-CM | POA: Diagnosis present

## 2022-12-31 DIAGNOSIS — Z9889 Other specified postprocedural states: Secondary | ICD-10-CM | POA: Diagnosis not present

## 2022-12-31 DIAGNOSIS — E785 Hyperlipidemia, unspecified: Secondary | ICD-10-CM | POA: Insufficient documentation

## 2022-12-31 DIAGNOSIS — I509 Heart failure, unspecified: Secondary | ICD-10-CM | POA: Diagnosis not present

## 2022-12-31 DIAGNOSIS — Z79899 Other long term (current) drug therapy: Secondary | ICD-10-CM | POA: Diagnosis not present

## 2022-12-31 DIAGNOSIS — K219 Gastro-esophageal reflux disease without esophagitis: Secondary | ICD-10-CM | POA: Diagnosis not present

## 2022-12-31 DIAGNOSIS — N186 End stage renal disease: Secondary | ICD-10-CM | POA: Insufficient documentation

## 2022-12-31 DIAGNOSIS — I132 Hypertensive heart and chronic kidney disease with heart failure and with stage 5 chronic kidney disease, or end stage renal disease: Secondary | ICD-10-CM | POA: Insufficient documentation

## 2022-12-31 DIAGNOSIS — K922 Gastrointestinal hemorrhage, unspecified: Secondary | ICD-10-CM | POA: Diagnosis not present

## 2022-12-31 DIAGNOSIS — E119 Type 2 diabetes mellitus without complications: Secondary | ICD-10-CM

## 2022-12-31 DIAGNOSIS — G629 Polyneuropathy, unspecified: Secondary | ICD-10-CM

## 2022-12-31 DIAGNOSIS — K625 Hemorrhage of anus and rectum: Secondary | ICD-10-CM | POA: Diagnosis present

## 2022-12-31 DIAGNOSIS — Z992 Dependence on renal dialysis: Secondary | ICD-10-CM | POA: Diagnosis not present

## 2022-12-31 DIAGNOSIS — K3184 Gastroparesis: Secondary | ICD-10-CM | POA: Insufficient documentation

## 2022-12-31 DIAGNOSIS — G4733 Obstructive sleep apnea (adult) (pediatric): Secondary | ICD-10-CM

## 2022-12-31 DIAGNOSIS — G609 Hereditary and idiopathic neuropathy, unspecified: Secondary | ICD-10-CM

## 2022-12-31 DIAGNOSIS — K921 Melena: Secondary | ICD-10-CM

## 2022-12-31 LAB — COMPREHENSIVE METABOLIC PANEL
ALT: <5 U/L (ref 0–44)
AST: 34 U/L (ref 15–41)
Albumin: 3.5 g/dL (ref 3.5–5.0)
Alkaline Phosphatase: 66 U/L (ref 38–126)
Anion gap: 17 — ABNORMAL HIGH (ref 5–15)
BUN: 49 mg/dL — ABNORMAL HIGH (ref 6–20)
CO2: 24 mmol/L (ref 22–32)
Calcium: 9.3 mg/dL (ref 8.9–10.3)
Chloride: 95 mmol/L — ABNORMAL LOW (ref 98–111)
Creatinine, Ser: 9.28 mg/dL — ABNORMAL HIGH (ref 0.44–1.00)
GFR, Estimated: 5 mL/min — ABNORMAL LOW (ref >60)
Glucose, Bld: 96 mg/dL (ref 70–99)
Potassium: 4.7 mmol/L (ref 3.5–5.1)
Sodium: 136 mmol/L (ref 135–145)
Total Bilirubin: 0.3 mg/dL (ref <1.2)
Total Protein: 7.3 g/dL (ref 6.5–8.1)

## 2022-12-31 LAB — CBC
HCT: 24.7 % — ABNORMAL LOW (ref 36.0–46.0)
Hemoglobin: 7.3 g/dL — ABNORMAL LOW (ref 12.0–15.0)
MCH: 25.6 pg — ABNORMAL LOW (ref 26.0–34.0)
MCHC: 29.6 g/dL — ABNORMAL LOW (ref 30.0–36.0)
MCV: 86.7 fL (ref 80.0–100.0)
Platelets: 298 10*3/uL (ref 150–400)
RBC: 2.85 MIL/uL — ABNORMAL LOW (ref 3.87–5.11)
RDW: 20.1 % — ABNORMAL HIGH (ref 11.5–15.5)
WBC: 7.7 10*3/uL (ref 4.0–10.5)
nRBC: 0.3 % — ABNORMAL HIGH (ref 0.0–0.2)

## 2022-12-31 LAB — PREPARE RBC (CROSSMATCH)

## 2022-12-31 MED ORDER — ROPINIROLE HCL 1 MG PO TABS
1.0000 mg | ORAL_TABLET | Freq: Every day | ORAL | Status: DC
  Administered 2023-01-01: 1 mg via ORAL
  Filled 2022-12-31: qty 1

## 2022-12-31 MED ORDER — INSULIN ASPART 100 UNIT/ML IJ SOLN: 0.0000 [IU] | Freq: Every day | INTRAMUSCULAR | Status: DC

## 2022-12-31 MED ORDER — INSULIN ASPART 100 UNIT/ML IJ SOLN: 0.0000 [IU] | Freq: Three times a day (TID) | INTRAMUSCULAR | Status: DC

## 2022-12-31 MED ORDER — ZOLPIDEM TARTRATE 5 MG PO TABS
5.0000 mg | ORAL_TABLET | Freq: Every day | ORAL | Status: DC
Start: 2022-12-31 — End: 2023-01-01

## 2022-12-31 MED ORDER — ISOSORBIDE MONONITRATE ER 30 MG PO TB24: 60.0000 mg | ORAL_TABLET | Freq: Every evening | ORAL | Status: DC

## 2022-12-31 MED ORDER — SODIUM CHLORIDE 0.9% FLUSH
3.0000 mL | INTRAVENOUS | Status: DC | PRN
Start: 2022-12-31 — End: 2023-01-01

## 2022-12-31 MED ORDER — SODIUM CHLORIDE 0.9 % IV SOLN: 250.0000 mL | INTRAVENOUS | Status: DC | PRN

## 2022-12-31 MED ORDER — FERROUS SULFATE 325 (65 FE) MG PO TABS
325.0000 mg | ORAL_TABLET | Freq: Two times a day (BID) | ORAL | Status: DC
  Administered 2022-12-31 – 2023-01-01 (×2): 325 mg via ORAL
  Filled 2022-12-31 (×2): qty 1

## 2022-12-31 MED ORDER — PANTOPRAZOLE SODIUM 40 MG IV SOLR
40.0000 mg | Freq: Two times a day (BID) | INTRAVENOUS | Status: DC
  Administered 2023-01-01 (×2): 40 mg via INTRAVENOUS
  Filled 2022-12-31: qty 10

## 2022-12-31 MED ORDER — LANTHANUM CARBONATE 500 MG PO CHEW
2000.0000 mg | CHEWABLE_TABLET | Freq: Three times a day (TID) | ORAL | Status: DC
Start: 2023-01-01 — End: 2023-01-01
  Filled 2022-12-31 (×3): qty 4

## 2022-12-31 MED ORDER — HYDRALAZINE HCL 25 MG PO TABS
75.0000 mg | ORAL_TABLET | Freq: Three times a day (TID) | ORAL | Status: DC
  Administered 2022-12-31: 75 mg via ORAL
  Filled 2022-12-31: qty 3

## 2022-12-31 MED ORDER — CARVEDILOL 12.5 MG PO TABS
25.0000 mg | ORAL_TABLET | Freq: Two times a day (BID) | ORAL | Status: DC
  Administered 2023-01-01: 25 mg via ORAL
  Filled 2022-12-31: qty 2

## 2022-12-31 MED ORDER — SODIUM CHLORIDE 0.9% IV SOLUTION: Freq: Once | INTRAVENOUS | Status: DC

## 2022-12-31 MED ORDER — SENNOSIDES-DOCUSATE SODIUM 8.6-50 MG PO TABS: 1.0000 | ORAL_TABLET | Freq: Every evening | ORAL | Status: DC | PRN

## 2022-12-31 MED ORDER — ALLOPURINOL 100 MG PO TABS: 100.0000 mg | ORAL_TABLET | Freq: Every day | ORAL | Status: DC

## 2022-12-31 MED ORDER — ONDANSETRON HCL 4 MG/2ML IJ SOLN: 4.0000 mg | Freq: Four times a day (QID) | INTRAMUSCULAR | Status: DC | PRN

## 2022-12-31 MED ORDER — SODIUM CHLORIDE 0.9% FLUSH
3.0000 mL | Freq: Two times a day (BID) | INTRAVENOUS | Status: DC
  Administered 2023-01-01: 3 mL via INTRAVENOUS

## 2022-12-31 MED ORDER — AMLODIPINE BESYLATE 5 MG PO TABS
10.0000 mg | ORAL_TABLET | Freq: Every day | ORAL | Status: DC
  Administered 2023-01-01: 10 mg via ORAL
  Filled 2022-12-31: qty 2

## 2022-12-31 MED ORDER — ALPRAZOLAM 0.25 MG PO TABS
1.0000 mg | ORAL_TABLET | Freq: Three times a day (TID) | ORAL | Status: DC | PRN
  Administered 2023-01-01 (×2): 1 mg via ORAL
  Filled 2022-12-31 (×2): qty 4

## 2022-12-31 MED ORDER — LANTHANUM CARBONATE 500 MG PO CHEW
1000.0000 mg | CHEWABLE_TABLET | ORAL | Status: DC
  Administered 2023-01-01 (×2): 1000 mg via ORAL
  Filled 2022-12-31 (×3): qty 2

## 2022-12-31 MED ORDER — ACETAMINOPHEN 325 MG PO TABS: 650.0000 mg | ORAL_TABLET | Freq: Four times a day (QID) | ORAL | Status: DC | PRN

## 2022-12-31 MED ORDER — SODIUM CHLORIDE 0.9% IV SOLUTION: Freq: Once | INTRAVENOUS | Status: AC

## 2022-12-31 MED ORDER — HYDROXYZINE HCL 25 MG PO TABS
25.0000 mg | ORAL_TABLET | Freq: Four times a day (QID) | ORAL | Status: DC | PRN
  Administered 2023-01-01: 25 mg via ORAL
  Filled 2022-12-31: qty 1

## 2022-12-31 MED ORDER — CARBIDOPA-LEVODOPA 25-100 MG PO TABS
1.0000 | ORAL_TABLET | Freq: Three times a day (TID) | ORAL | Status: DC
  Administered 2022-12-31 – 2023-01-01 (×2): 1 via ORAL
  Filled 2022-12-31 (×2): qty 1

## 2022-12-31 MED ORDER — ACETAMINOPHEN 650 MG RE SUPP: 650.0000 mg | Freq: Four times a day (QID) | RECTAL | Status: DC | PRN

## 2022-12-31 MED ORDER — LANTHANUM CARBONATE 500 MG PO CHEW: 1000.0000 mg | CHEWABLE_TABLET | ORAL | Status: DC

## 2022-12-31 MED ORDER — DULOXETINE HCL 30 MG PO CPEP
30.0000 mg | ORAL_CAPSULE | Freq: Every day | ORAL | Status: DC
Start: 2023-01-01 — End: 2023-01-01
  Administered 2023-01-01: 30 mg via ORAL
  Filled 2022-12-31: qty 1

## 2022-12-31 MED ORDER — GABAPENTIN 300 MG PO CAPS
300.0000 mg | ORAL_CAPSULE | Freq: Three times a day (TID) | ORAL | Status: DC
  Administered 2022-12-31: 300 mg via ORAL
  Filled 2022-12-31: qty 1

## 2022-12-31 MED ORDER — ONDANSETRON HCL 4 MG PO TABS
4.0000 mg | ORAL_TABLET | Freq: Four times a day (QID) | ORAL | Status: DC | PRN
Start: 2022-12-31 — End: 2023-01-01
  Administered 2023-01-01: 4 mg via ORAL
  Filled 2022-12-31: qty 1

## 2022-12-31 NOTE — ED Provider Notes (Signed)
[place] EMERGENCY DEPARTMENT AT [facility] Provider Note   CSN: [id number] Arrival date & time: 12/31/22  1429     History  Chief Complaint  Patient presents with   Hematochezia    Patricia [name] is a 47 y.o. female.  HPI    47 year old female with history of hypertension, depression, anxiety, restless leg syndrome, GERD, end-stage renal disease on hemodialysis, gastroparesis, who presents emergency department for chief concerns of rectal bleed.   Patient was admitted earlier this year for bloody stools at [facility]  She was found to have internal hemorrhoids and diverticulosis.  Patient also has history of anemia of chronic disease and has required blood transfusion in the past, including 2 units earlier this month.  Patient states that yesterday she started having bloody stools.  She has had a total of 5 bloody stools and, including for today which has had blood in it.  The bleeding has slowed down, but not stopped.  Home Medications Prior to Admission medications   Medication Sig Start Date End Date Taking? Authorizing Provider  acetaminophen (TYLENOL) 500 MG tablet Take 1,000 mg by mouth every 6 (six) hours as needed for moderate pain.    [provider]  allopurinol (ZYLOPRIM) 100 MG tablet Take 100 mg by mouth at bedtime.     [provider]  ALPRAZolam Prudy Feeler) 1 MG tablet Take 1 mg by mouth 3 (three) times daily as needed for anxiety or sleep.    [provider]  amLODipine (NORVASC) 10 MG tablet Take 1 tablet (10 mg total) by mouth daily. 07/25/22   Wouk, Wilfred Curtis, MD  ascorbic acid (VITAMIN C) 500 MG tablet Take 1 tablet (500 mg total) by mouth 2 (two) times daily. 08/13/22   Arnetha Courser, MD  carbidopa-levodopa (SINEMET IR) 25-100 MG tablet TAKE 1 TABLET BY MOUTH THREE TIMES DAILY 12/24/22   Levert Feinstein, MD  carvedilol (COREG) 25 MG tablet Take 1 tablet (25 mg total) by mouth 2 (two) times daily with a meal.  07/24/22   Wouk, Wilfred Curtis, MD  Diclofenac Sodium 1 % CREA 1 gram qid prn 05/26/21   Levert Feinstein, MD  DULoxetine (CYMBALTA) 30 MG capsule TAKE 1 CAPSULE(30 MG) BY MOUTH DAILY 10/26/22   Levert Feinstein, MD  eszopiclone (LUNESTA) 1 MG TABS tablet Take 1 mg by mouth at bedtime. 09/17/22   [provider]  ferrous sulfate 325 (65 FE) MG EC tablet Take 1 tablet (325 mg total) by mouth 2 (two) times daily. 10/23/22 10/23/23  Arnetha Courser, MD  gabapentin (NEURONTIN) 100 MG capsule TAKE 3 CAPSULES(300 MG) BY MOUTH THREE TIMES DAILY 11/17/22   Levert Feinstein, MD  gentamicin cream (GARAMYCIN) 0.1 % Apply 1 Application topically 2 (two) times daily. 09/15/22   Felecia Shelling, DPM  hydrALAZINE (APRESOLINE) 25 MG tablet Take 3 tablets (75 mg total) by mouth every 8 (eight) hours. 08/13/22   Arnetha Courser, MD  hydrocortisone (ANUSOL-HC) 2.5 % rectal cream Place 1 Application rectally 2 (two) times daily. 07/01/22   Pilar Jarvis, MD  hydrOXYzine (ATARAX/VISTARIL) 25 MG tablet Take 25 mg by mouth every 6 (six) hours as needed for itching.    [provider]  isosorbide mononitrate (IMDUR) 60 MG 24 hr tablet Take 1 tablet (60 mg total) by mouth every evening. Skip the dose if systolic BP less than 140 mmHg 05/13/22 05/13/23  Gillis Santa, MD  lanthanum (FOSRENOL) 1000 MG chewable tablet Chew 1,000-2,000 mg by mouth See admin instructions.  Take 2 tablets (2000mg ) by mouth three times daily with meals and take 1 tablet (1000mg ) by mouth twice daily with snacks    [provider]  lidocaine-prilocaine (EMLA) cream 1gram tid prn 05/26/21   Levert Feinstein, MD  multivitamin (RENA-VIT) TABS tablet Take 1 tablet by mouth at bedtime.     [provider]  omeprazole (PRILOSEC) 40 MG capsule Take 1 capsule (40 mg total) by mouth daily. Patient taking differently: Take 40 mg by mouth 2 (two) times daily. 08/13/22   Arnetha Courser, MD  polyethylene glycol (MIRALAX) 17 g packet Take 17 g by mouth daily. Patient taking  differently: Take 17 g by mouth daily as needed for mild constipation or moderate constipation. 07/01/22   Pilar Jarvis, MD  promethazine (PHENERGAN) 25 MG tablet Take 25 mg by mouth 3 (three) times daily as needed for nausea or vomiting.    [provider]  rOPINIRole (REQUIP) 1 MG tablet TAKE 1 TABLET(1 MG) BY MOUTH AT BEDTIME 10/26/22   Levert Feinstein, MD  Vilazodone HCl (VIIBRYD) 10 MG TABS Take 10 mg by mouth daily.    [provider]  zinc sulfate 220 (50 Zn) MG capsule Take 1 capsule (220 mg total) by mouth daily. 08/13/22   Arnetha Courser, MD      Allergies    Nsaids and Tolmetin    Review of Systems   Review of Systems  All other systems reviewed and are negative.   Physical Exam Updated Vital Signs BP (!) 133/100   Pulse 74   Temp 98 F (36.7 C)   Resp 14   SpO2 100%  Physical Exam Vitals and nursing note reviewed.  Constitutional:      Appearance: She is well-developed.  HENT:     Head: Atraumatic.  Cardiovascular:     Rate and Rhythm: Normal rate.  Pulmonary:     Effort: Pulmonary effort is normal.  Musculoskeletal:     Cervical back: Normal range of motion and neck supple.  Skin:    General: Skin is warm and dry.  Neurological:     Mental Status: She is alert and oriented to person, place, and time.     ED Results / Procedures / Treatments   Labs (all labs ordered are listed, but only abnormal results are displayed) Labs Reviewed  COMPREHENSIVE METABOLIC PANEL - Abnormal; Notable for the following components:      Result Value   Chloride 95 (*)    BUN 49 (*)    Creatinine, Ser 9.28 (*)    GFR, Estimated 5 (*)    Anion gap 17 (*)    All other components within normal limits  CBC - Abnormal; Notable for the following components:   RBC 2.85 (*)    Hemoglobin 7.3 (*)    HCT 24.7 (*)    MCH 25.6 (*)    MCHC 29.6 (*)    RDW 20.1 (*)    nRBC 0.3 (*)    All other components within normal limits  POC OCCULT BLOOD, ED  TYPE AND SCREEN   PREPARE RBC (CROSSMATCH)    EKG None  Radiology No results found.  Procedures .Critical Care  Performed by: Derwood Kaplan, MD Authorized by: Derwood Kaplan, MD   Critical care provider statement:    Critical care time (minutes):  41   Critical care time was exclusive of:  Separately billable procedures and treating other patients   Critical care was time spent personally by me on the following activities:  Development of treatment plan with patient or surrogate, discussions with consultants, evaluation of patient's response to treatment, examination of patient, ordering and review of laboratory studies, ordering and review of radiographic studies, ordering and performing treatments and interventions, pulse oximetry, re-evaluation of patient's condition, review of old charts and obtaining history from patient or surrogate     Medications Ordered in ED Medications  0.9 %  sodium chloride infusion (Manually program via Guardrails IV Fluids) (has no administration in time range)    ED Course/ Medical Decision Making/ A&P                                 Medical Decision Making Amount and/or Complexity of Data Reviewed Labs: ordered.  Risk Prescription drug management. Decision regarding hospitalization.   47 year old patient comes in with chief complaint of bloody stools.  She has previous history of lower GI bleed, when she was admitted to [facility] about 2 months ago.  Patient also has anemia of chronic disease requiring blood transfusion earlier this month.  Differential diagnosis for this painless episode of abdominal pain includes: Diverticular bleed Colon cancer Rectal bleed Internal hemorrhoids External hemorrhoids  I reviewed patient's previous records including colonoscopy and discharge summary from 10, 2024.  Patient at that time was found to have internal hemorrhoids and diverticulosis.  Basic labs ordered.  Patient noted to have hemoglobin less  than 7.5.  We will proceed with admission request.  Case discussed with medicine team.  They are recommending that we get 1 unit of blood transfused now.  I agree, especially since patient does not have significant reserves.  Medicine team will consult GI and also get CT abdomen pelvis.  Final Clinical Impression(s) / ED Diagnoses Final diagnoses:  Lower GI bleed  Hematochezia    Rx / DC Orders ED Discharge Orders     None         Derwood Kaplan, MD 12/31/22 2027

## 2022-12-31 NOTE — ED Notes (Signed)
Pt was stuck twice. Wasn't successful.

## 2022-12-31 NOTE — ED Triage Notes (Signed)
Pt c/o bright red blood from her rectum since yesterday. Pt reports hx of anemia, ESRD on dialysis (MWF with last tx on Tu d/t holiday). Pt states that she was seen for similar episode recently and had colonoscopy that revealed diverticuli and internal hemorrhoids. Pt endorses DOE.

## 2022-12-31 NOTE — H&P (Addendum)
History and Physical    Patricia [name] JXB:[id number] DOB: 09-27-1975 DOA: 12/31/2022  PCP: Rick Duff, PA-C   Patient coming from: Home   Chief Complaint:  Chief Complaint  Patient presents with   Hematochezia   ED TRIAGE note:  Pt c/o bright red blood from her rectum since yesterday. Pt reports hx of anemia, ESRD on dialysis (MWF with last tx on Tu d/t holiday). Pt states that she was seen for similar episode recently and had colonoscopy that revealed diverticuli and internal hemorrhoids. Pt endorses DOE.       HPI:  Patricia [name] is a 47 y.o. female with medical history significant of ESRD on dialysis, gastroparesis, non-insulin-dependent DM type II, anemia of chronic disease, peripheral neuropathy, essential hypertension, depression, anxiety, restless leg syndrome, GERD, history of previous GI bleed due to internal hemorrhoid and chronic shakiness of bilateral upper extremity (per patient neurology working on diagnosis of Parkinson disease and patient is currently on Sinemet) Presented to emergency department complaining of noticing bright red blood per rectum since yesterday 12/25. Patient reported that she has 5 episodes of bright red blood mixed with her stool and blood clots since yesterday.  Currently in the ED patient does not have any active GI bleed.  Hemodynamically stable.  Patient is complaining about suprapubic/lower abdominal pain.  Denies any generalized abdominal pain, nausea, vomiting and diarrhea.  Denies any fever and chill.  Patient reported her symptoms seems like she had the GI bleed in October 2024. Patient is also endorsing generalized fatigue..  Denies any palpitation, chest pain, headache, diaphoresis, vomiting, nausea, generalized abdominal pain, constipation and diarrhea.  Patient was hospitalized October 2024 due to GI bleed. CT abdomen pelvis without contrast: 1. Diverticular disease of the colon without convincing evidence  for active inflammatory process. Question fistulous communication between the rectum and sigmoid colon. 2. Cholelithiasis. 3. Atrophic kidneys with bilateral kidney stones. 4. Aortic atherosclerosis. GI was consulted and patient underwent colonoscopy on 10/17, find to have diverticulosis, no active bleeding, no specimen taken.  Bleeding likely hemorrhoidal versus diverticular. Patient did received 1 unit of PRBC.  Patient was also started on iron supplement and will continue with Aranesp with dialysis.  ED Course:  At presentation to ED patient is hemodynamically stable Pending fecal occult blood test. CMP unremarkable except evidence of ESRD and anion gap 17. CBC showing low hemoglobin 7.3, hematocrit 24 normal MCV 86.  WBC count 7.7 and platelet 298.  Baseline hemoglobin around 7.5-8.  Given patient's hemoglobin is 7.3 and baseline around 8 informed ED physician to transfuse 1 unit of blood tonight. Hospitalist has been contacted for further evaluation management of lower GI bleed.  Significant labs in the ED: Lab Orders         Comprehensive metabolic panel         CBC         Hemoglobin and hematocrit, blood         Comprehensive metabolic panel         CBC         POC occult blood, ED         CBG monitoring, ED       Review of Systems:  Review of Systems  Constitutional:  Positive for malaise/fatigue. Negative for chills, fever and weight loss.  Respiratory:  Negative for cough and sputum production.   Cardiovascular:  Negative for chest pain and leg swelling.  Gastrointestinal:  Positive for blood in stool. Negative for abdominal pain, constipation,  diarrhea, heartburn, melena, nausea and vomiting.  Neurological:  Negative for dizziness, weakness and headaches.  Psychiatric/Behavioral:  The patient is not nervous/anxious.     Past Medical History:  Diagnosis Date   Acid reflux    Anemia 04/2019   REQUIRING BLOOD TRANSFUSIONS   Anxiety    Arthritis    CHF (congestive  heart failure) (HCC)    Dyspnea    with exertion   Dysrhythmia    tachycardia   ESRD (end stage renal disease) (HCC)    [facility]   Facial twitching 11/2020   mouth twitch, eyes roll back, neurology started patient on low dose   Sinemet IR.   GI bleeding 12/2017   Gout    Headache(784.0)    "related to chemo; sometimes weekly" (09/12/2013)   Heart murmur    Systolic per Dr Orpah Cobb' notes   High cholesterol    History of blood transfusion "a couple"   "related to low counts"   Hypertension    Iron deficiency anemia    "get epogen shots q month" (02/20/2014)   MCGN (minimal change glomerulonephritis)    "using chemo to tx;  finished my last tx in 12/2013"   Neuropathy    Peritoneal dialysis status (HCC)    Stroke (HCC) 08/15/2017   ruled out that it was not a stroke    Past Surgical History:  Procedure Laterality Date   ABDOMINAL HYSTERECTOMY  2010   "laparoscopic"   ANKLE FRACTURE SURGERY Right 1994   AV FISTULA PLACEMENT Left 04/14/2016   Procedure: ARTERIOVENOUS (AV) FISTULA CREATION;  Surgeon: Chuck Hint, MD;  Location: [facility] OR;  Service: Vascular;  Laterality: Left;   AV FISTULA PLACEMENT Left 08/09/2017   Procedure: INSERTION OF ARTERIOVENOUS (AV) GORE-TEX GRAFT LEFT ARM;  Surgeon: Sherren Kerns, MD;  Location: [facility] OR;  Service: Vascular;  Laterality: Left;   AV FISTULA PLACEMENT Right 03/15/2020   Procedure: RIGHT ARM ARTERIOVENOUS (AV) FISTULA CREATION 1st Stage Basilic Transposition;  Surgeon: Nada Libman, MD;  Location: [facility] OR;  Service: Vascular;  Laterality: Right;   AV FISTULA PLACEMENT Right 02/24/2021   Procedure: RIGHT ARM ARTERIOVENOUS  FISTULA CREATION;  Surgeon: Victorino Sparrow, MD;  Location: [facility] OR;  Service: Vascular;  Laterality: Right;  regional block   AVGG REMOVAL Left 08/11/2017   Procedure: REMOVAL OF ARTERIOVENOUS GORETEX GRAFT (AVGG);  Surgeon: Nada Libman, MD;  Location: [facility] OR;  Service: Vascular;  Laterality: Left;    BASCILIC VEIN TRANSPOSITION Right 05/17/2020   Procedure: RIGHT UPPER EXTREMITY SECOND STAGE BASCILIC VEIN TRANSPOSITION;  Surgeon: Nada Libman, MD;  Location: [facility] OR;  Service: Vascular;  Laterality: Right;   BIOPSY  09/03/2017   Procedure: BIOPSY;  Surgeon: Kathi Der, MD;  Location: [facility] ENDOSCOPY;  Service: Gastroenterology;;   BIOPSY  12/24/2017   Procedure: BIOPSY;  Surgeon: Lemar Lofty., MD;  Location: [facility] ENDOSCOPY;  Service: Gastroenterology;;   CARDIAC CATHETERIZATION  2000's   COLONOSCOPY N/A 10/22/2022   Procedure: COLONOSCOPY;  Surgeon: Regis Bill, MD;  Location: [facility] ENDOSCOPY;  Service: Endoscopy;  Laterality: N/A;   COLONOSCOPY WITH PROPOFOL N/A 09/04/2017   Procedure: COLONOSCOPY WITH PROPOFOL;  Surgeon: Kerin Salen, MD;  Location: [facility] ENDOSCOPY;  Service: Gastroenterology;  Laterality: N/A;   ENTEROSCOPY N/A 12/24/2017   Procedure: ENTEROSCOPY;  Surgeon: Meridee Score Netty Starring., MD;  Location: [facility] ENDOSCOPY;  Service: Gastroenterology;  Laterality: N/A;   ESOPHAGOGASTRODUODENOSCOPY     ESOPHAGOGASTRODUODENOSCOPY (EGD) WITH PROPOFOL Left 06/04/2017  Procedure: ESOPHAGOGASTRODUODENOSCOPY (EGD) WITH PROPOFOL;  Surgeon: Willis Modena, MD;  Location: [facility] ENDOSCOPY;  Service: Endoscopy;  Laterality: Left;   ESOPHAGOGASTRODUODENOSCOPY (EGD) WITH PROPOFOL N/A 09/03/2017   Procedure: ESOPHAGOGASTRODUODENOSCOPY (EGD) WITH PROPOFOL;  Surgeon: Kathi Der, MD;  Location: [facility] ENDOSCOPY;  Service: Gastroenterology;  Laterality: N/A;   ESOPHAGOGASTRODUODENOSCOPY (EGD) WITH PROPOFOL N/A 04/27/2022   Procedure: ESOPHAGOGASTRODUODENOSCOPY (EGD) WITH PROPOFOL;  Surgeon: Regis Bill, MD;  Location: [facility] ENDOSCOPY;  Service: Endoscopy;  Laterality: N/A;   FISTULA SUPERFICIALIZATION Left 04/02/2017   Procedure: FISTULA SUPERFICIALIZATION LEFT ARM;  Surgeon: Nada Libman, MD;  Location: [facility] OR;  Service: Vascular;  Laterality: Left;   FISTULA SUPERFICIALIZATION  Right 04/14/2021   Procedure: RIGHT ARM FISTULA SUPERFICIALIZATION;  Surgeon: Victorino Sparrow, MD;  Location: [facility] OR;  Service: Vascular;  Laterality: Right;  PERIPHERAL NERVE BLOCK   FISTULOGRAM Left 04/07/2016   Procedure: FISTULOGRAM;  Surgeon: Maeola Harman, MD;  Location: [facility] OR;  Service: Vascular;  Laterality: Left;   FRACTURE SURGERY     right ankle   GIVENS CAPSULE STUDY N/A 10/29/2017   Procedure: GIVENS CAPSULE STUDY;  Surgeon: Charlott Rakes, MD;  Location: [facility] ENDOSCOPY;  Service: Endoscopy;  Laterality: N/A;   GIVENS CAPSULE STUDY N/A 12/24/2017   Procedure: GIVENS CAPSULE STUDY;  Surgeon: Lemar Lofty., MD;  Location: [facility] ENDOSCOPY;  Service: Gastroenterology;  Laterality: N/A;   GIVENS CAPSULE STUDY N/A 05/22/2018   Procedure: GIVENS CAPSULE STUDY;  Surgeon: Lemar Lofty., MD;  Location: [facility] ENDOSCOPY;  Service: Gastroenterology;  Laterality: N/A;   INSERTION OF DIALYSIS CATHETER Right 04/07/2016   Procedure: INSERTION OF DIALYSIS CATHETER;  Surgeon: Maeola Harman, MD;  Location: [facility] OR;  Service: Vascular;  Laterality: Right;   INSERTION OF DIALYSIS CATHETER Right 04/04/2017   Procedure: INSERTION OF DIALYSIS CATHETER;  Surgeon: Maeola Harman, MD;  Location: [facility] OR;  Service: Vascular;  Laterality: Right;   IR FLUORO GUIDE CV LINE LEFT  03/13/2020   IR US GUIDE VASC ACCESS LEFT  03/13/2020   LEFT HEART CATH AND CORONARY ANGIOGRAPHY N/A 05/12/2019   Procedure: LEFT HEART CATH AND CORONARY ANGIOGRAPHY;  Surgeon: Orpah Cobb, MD;  [facility] INVASIVE CV LAB;  Service: Cardiovascular;  Laterality: N/A;   LIGATION OF ARTERIOVENOUS  FISTULA Left 04/14/2016   Procedure: LIGATION OF ARTERIOVENOUS  FISTULA;  Surgeon: Chuck Hint, MD;  Location: [facility] OR;  Service: Vascular;  Laterality: Left;   LIGATION OF COMPETING BRANCHES OF ARTERIOVENOUS FISTULA Right 05/17/2020   Procedure: LIGATION OF COMPETING BRANCHES OF RIGHT ARM ARTERIOVENOUS  FISTULA;  Surgeon: Nada Libman, MD;  Location: [facility] OR;  Service: Vascular;  Laterality: Right;   PATCH ANGIOPLASTY Left 06/19/2016   Procedure: PATCH ANGIOPLASTY LEFT ARTERIOVENOUS FISTULA;  Surgeon: Chuck Hint, MD;  Location: [facility] OR;  Service: Vascular;  Laterality: Left;   POLYPECTOMY  09/04/2017   Procedure: POLYPECTOMY;  Surgeon: Kerin Salen, MD;  Location: [facility] ENDOSCOPY;  Service: Gastroenterology;;   REVISON OF ARTERIOVENOUS FISTULA Left 06/19/2016   Procedure: REVISION SUPERFICIALIZATION OF BRACHIOCEPHALIC ARTERIOVENOUS FISTULA;  Surgeon: Chuck Hint, MD;  Location: [facility] OR;  Service: Vascular;  Laterality: Left;   UPPER EXTREMITY VENOGRAPHY Bilateral 02/19/2021   Procedure: UPPER EXTREMITY VENOGRAPHY;  Surgeon: Victorino Sparrow, MD;  Location: [facility] INVASIVE CV LAB;  Service: Cardiovascular;  Laterality: Bilateral;     reports that she has never smoked. She has never used smokeless tobacco. She reports that she does not drink alcohol and does not use drugs.  Allergies  Allergen Reactions   Nsaids Other (See Comments)    Cannot take due to Kidney disease/Kidney function  Other Reaction(s): Unknown   Tolmetin Other (See Comments)    Cannot take due to Kidney Disease    Family History  Problem Relation Age of Onset   Hypertension Mother    Thyroid disease Mother    Coronary artery disease Father    Hypertension Father    Diabetes Father    Colon cancer Maternal Aunt    Esophageal cancer Maternal Uncle    Inflammatory bowel disease Neg Hx    Liver disease Neg Hx    Pancreatic cancer Neg Hx    Rectal cancer Neg Hx    Stomach cancer Neg Hx     Prior to Admission medications   Medication Sig Start Date End Date Taking? Authorizing Provider  acetaminophen (TYLENOL) 500 MG tablet Take 1,000 mg by mouth every 6 (six) hours as needed for moderate pain.    [provider]  allopurinol (ZYLOPRIM) 100 MG tablet Take 100 mg by mouth at bedtime.      [provider]  ALPRAZolam Prudy Feeler) 1 MG tablet Take 1 mg by mouth 3 (three) times daily as needed for anxiety or sleep.    [provider]  amLODipine (NORVASC) 10 MG tablet Take 1 tablet (10 mg total) by mouth daily. 07/25/22   Wouk, Wilfred Curtis, MD  ascorbic acid (VITAMIN C) 500 MG tablet Take 1 tablet (500 mg total) by mouth 2 (two) times daily. 08/13/22   Arnetha Courser, MD  carbidopa-levodopa (SINEMET IR) 25-100 MG tablet TAKE 1 TABLET BY MOUTH THREE TIMES DAILY 12/24/22   Levert Feinstein, MD  carvedilol (COREG) 25 MG tablet Take 1 tablet (25 mg total) by mouth 2 (two) times daily with a meal. 07/24/22   Wouk, Wilfred Curtis, MD  Diclofenac Sodium 1 % CREA 1 gram qid prn 05/26/21   Levert Feinstein, MD  DULoxetine (CYMBALTA) 30 MG capsule TAKE 1 CAPSULE(30 MG) BY MOUTH DAILY 10/26/22   Levert Feinstein, MD  eszopiclone (LUNESTA) 1 MG TABS tablet Take 1 mg by mouth at bedtime. 09/17/22   [provider]  ferrous sulfate 325 (65 FE) MG EC tablet Take 1 tablet (325 mg total) by mouth 2 (two) times daily. 10/23/22 10/23/23  Arnetha Courser, MD  gabapentin (NEURONTIN) 100 MG capsule TAKE 3 CAPSULES(300 MG) BY MOUTH THREE TIMES DAILY 11/17/22   Levert Feinstein, MD  gentamicin cream (GARAMYCIN) 0.1 % Apply 1 Application topically 2 (two) times daily. 09/15/22   Felecia Shelling, DPM  hydrALAZINE (APRESOLINE) 25 MG tablet Take 3 tablets (75 mg total) by mouth every 8 (eight) hours. 08/13/22   Arnetha Courser, MD  hydrocortisone (ANUSOL-HC) 2.5 % rectal cream Place 1 Application rectally 2 (two) times daily. 07/01/22   Pilar Jarvis, MD  hydrOXYzine (ATARAX/VISTARIL) 25 MG tablet Take 25 mg by mouth every 6 (six) hours as needed for itching.    [provider]  isosorbide mononitrate (IMDUR) 60 MG 24 hr tablet Take 1 tablet (60 mg total) by mouth every evening. Skip the dose if systolic BP less than 140 mmHg 05/13/22 05/13/23  Gillis Santa, MD  lanthanum (FOSRENOL) 1000 MG chewable tablet Chew 1,000-2,000 mg by  mouth See admin instructions. Take 2 tablets (2000mg ) by mouth three times daily with meals and take 1 tablet (1000mg ) by mouth twice daily with snacks    [provider]  lidocaine-prilocaine (EMLA) cream 1gram tid prn 05/26/21  Levert Feinstein, MD  multivitamin (RENA-VIT) TABS tablet Take 1 tablet by mouth at bedtime.     [provider]  omeprazole (PRILOSEC) 40 MG capsule Take 1 capsule (40 mg total) by mouth daily. Patient taking differently: Take 40 mg by mouth 2 (two) times daily. 08/13/22   Arnetha Courser, MD  polyethylene glycol (MIRALAX) 17 g packet Take 17 g by mouth daily. Patient taking differently: Take 17 g by mouth daily as needed for mild constipation or moderate constipation. 07/01/22   Pilar Jarvis, MD  promethazine (PHENERGAN) 25 MG tablet Take 25 mg by mouth 3 (three) times daily as needed for nausea or vomiting.    [provider]  rOPINIRole (REQUIP) 1 MG tablet TAKE 1 TABLET(1 MG) BY MOUTH AT BEDTIME 10/26/22   Levert Feinstein, MD  Vilazodone HCl (VIIBRYD) 10 MG TABS Take 10 mg by mouth daily.    [provider]  zinc sulfate 220 (50 Zn) MG capsule Take 1 capsule (220 mg total) by mouth daily. 08/13/22   Arnetha Courser, MD     Physical Exam: Vitals:   12/31/22 2201 01/01/23 0201 01/01/23 0330 01/01/23 0530  BP: 138/77 135/64 130/61 (!) 128/105  Pulse: 78 81 90 86  Resp: (!) 23 19 (!) 21 17  Temp: 98.4 F (36.9 C) 98.4 F (36.9 C)  97.8 F (36.6 C)  TempSrc: Oral Oral    SpO2: 100% 99% 98% 97%    Physical Exam Constitutional:      General: She is not in acute distress.    Appearance: She is obese. She is not ill-appearing.  HENT:     Mouth/Throat:     Mouth: Mucous membranes are moist.  Eyes:     Conjunctiva/sclera: Conjunctivae normal.  Cardiovascular:     Rate and Rhythm: Normal rate and regular rhythm.     Pulses: Normal pulses.     Heart sounds: Normal heart sounds.  Pulmonary:     Effort: Pulmonary effort is normal.     Breath  sounds: Normal breath sounds.  Abdominal:     General: Bowel sounds are normal. There is no distension.     Tenderness: There is no abdominal tenderness. There is no guarding or rebound.  Musculoskeletal:     Cervical back: Neck supple.     Right lower leg: No edema.     Left lower leg: No edema.  Skin:    Capillary Refill: Capillary refill takes less than 2 seconds.     Coloration: Skin is pale.  Neurological:     Mental Status: She is oriented to person, place, and time.  Psychiatric:        Attention and Perception: Attention normal.        Mood and Affect: Mood is anxious.        Speech: Speech normal.      Labs on Admission: I have personally reviewed following labs and imaging studies  CBC: Recent Labs  Lab 12/31/22 1652 01/01/23 0537  WBC 7.7 7.9  HGB 7.3* 7.8*  HCT 24.7* 25.1*  MCV 86.7 86.9  PLT 298 255   Basic Metabolic Panel: Recent Labs  Lab 12/31/22 1652 01/01/23 0537  NA 136 138  K 4.7 4.6  CL 95* 100  CO2 24 22  GLUCOSE 96 101*  BUN 49* 59*  CREATININE 9.28* 10.50*  CALCIUM 9.3 9.0   GFR: CrCl cannot be calculated (Unknown ideal weight.). Liver Function Tests: Recent Labs  Lab 12/31/22 1652 01/01/23 0537  AST 34  14*  ALT <5 6  ALKPHOS 66 59  BILITOT 0.3 0.5  PROT 7.3 6.5  ALBUMIN 3.5 3.0*   No results for input(s): "LIPASE", "AMYLASE" in the last 168 hours. No results for input(s): "AMMONIA" in the last 168 hours. Coagulation Profile: No results for input(s): "INR", "PROTIME" in the last 168 hours. Cardiac Enzymes: No results for input(s): "CKTOTAL", "CKMB", "CKMBINDEX", "TROPONINI", "TROPONINIHS" in the last 168 hours. BNP (last 3 results) Recent Labs    05/11/22 0056 07/23/22 1050  BNP 624.9* 885.2*   HbA1C: No results for input(s): "HGBA1C" in the last 72 hours. CBG: Recent Labs  Lab 01/01/23 0320  GLUCAP 110*   Lipid Profile: No results for input(s): "CHOL", "HDL", "LDLCALC", "TRIG", "CHOLHDL", "LDLDIRECT" in the  last 72 hours. Thyroid Function Tests: No results for input(s): "TSH", "T4TOTAL", "FREET4", "T3FREE", "THYROIDAB" in the last 72 hours. Anemia Panel: No results for input(s): "VITAMINB12", "FOLATE", "FERRITIN", "TIBC", "IRON", "RETICCTPCT" in the last 72 hours. Urine analysis:    Component Value Date/Time   COLORURINE COLORLESS (A) 04/03/2018 2319   APPEARANCEUR CLEAR 04/03/2018 2319   LABSPEC 1.006 04/03/2018 2319   PHURINE 8.0 04/03/2018 2319   GLUCOSEU >=500 (A) 04/03/2018 2319   HGBUR NEGATIVE 04/03/2018 2319   BILIRUBINUR NEGATIVE 04/03/2018 2319   KETONESUR NEGATIVE 04/03/2018 2319   PROTEINUR 100 (A) 04/03/2018 2319   UROBILINOGEN 0.2 09/19/2014 1620   NITRITE NEGATIVE 04/03/2018 2319   LEUKOCYTESUR NEGATIVE 04/03/2018 2319    Radiological Exams on Admission: I have personally reviewed images CT ABDOMEN PELVIS WO CONTRAST Result Date: 01/01/2023 CLINICAL DATA:  Rectal bleeding EXAM: CT ABDOMEN AND PELVIS WITHOUT CONTRAST TECHNIQUE: Multidetector CT imaging of the abdomen and pelvis was performed following the standard protocol without IV contrast. RADIATION DOSE REDUCTION: This exam was performed according to the departmental dose-optimization program which includes automated exposure control, adjustment of the mA and/or kV according to patient size and/or use of iterative reconstruction technique. COMPARISON:  10/19/2022 FINDINGS: Lower chest: Small hiatal hernia.  No acute findings. Hepatobiliary: Small layering gallstones. No biliary ductal dilatation or focal hepatic abnormality. Pancreas: No focal abnormality or ductal dilatation. Spleen: No focal abnormality.  Normal size. Adrenals/Urinary Tract: Bilateral nonobstructing renal stones. No ureteral stones or hydronephrosis. Adrenal glands and urinary bladder unremarkable. Stomach/Bowel: Colonic diverticulosis no active diverticulitis. Again noted is possible fistula between the rectum and mid sigmoid colon. This is best seen on  series 3, image 74 and is unchanged since prior study. Normal appendix. Stomach and small bowel decompressed, unremarkable. Vascular/Lymphatic: Aortic atherosclerosis. No evidence of aneurysm or adenopathy. Reproductive: Prior hysterectomy.  No adnexal masses. Other: No free fluid or free air. Calcification within the anterior abdomen is unchanged since prior study. Musculoskeletal: No acute bony abnormality. IMPRESSION: Diffuse colonic diverticulosis. No active diverticulitis. 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 Cholelithiasis. Aortic atherosclerosis. Bilateral nephrolithiasis. No acute findings. Electronically Signed   By: Charlett Nose M.D.   On: 01/01/2023 01:58     EKG: My personal interpretation of EKG shows: Normal sinus rhythm heart rate 62.    Assessment/Plan: Principal Problem:   Lower GI bleed Active Problems:   ESRD on dialysis (HCC)   Acute on chronic anemia   Essential hypertension   Peripheral neuropathy   Non-insulin dependent type 2 diabetes mellitus (HCC)   GERD (gastroesophageal reflux disease)   GAD (generalized anxiety disorder)   Restless leg syndrome   Gastroparesis   OSA on CPAP  Assessment and Plan: Lower GI bleed Bright red blood per rectum History of hemorrhoidal bleed in October 2024 > Patient presenting with complaining of blood mixed in her stool having 5 episodes in last 24 hours.  In the ED patient does not have any bleeding per rectum.  Hemodynamically stable - Hemoglobin 7.3.  Baseline hemoglobin around 8-8.5. -Previous CT scan from 10/24 showed diverticular disease without any active inflammatory process.  Questionable fistulous communication between rectum and sigmoid colon. - Checking CT abdomen without contrast for looking for any bleeding source.  CT abdomen pelvis with contrast will be more helpful given ESRD patient will avoid any contrast exposure. - Transfusing 1 unit of  blood tonight.  Checking H&H 3 times daily and goal to keep hemoglobin above 8. -Based on the history seems like that bleeding is from lower GI sources still treating patient with IV Protonix 40 mg twice daily until GI will be evaluated. - Per chart review patient has episodes of GI bleed in October 2024 underwent colonoscopy 10/27 found to have diverticulosis and no active bleeding.  Concern for bleed secondary to hemorrhoids versus diverticular. - Unassigned patient.  Sent secure chat message to [facility] Dr. Leonides Schanz to be evaluated by GI in the daytime. -Continue cardiac monitoring and admitting patient to progressive unit. Addendum - CT abdomen pelvis showed Diffuse colonic diverticulosis. No active diverticulitis. 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 -CT abdomen pelvis fine unchanged from October 2024. -The more appropriate would be CT angiogram GI bleed of the abdomen to look for bleeding source given ESRD will avoid contrast exposure. -If patient does not get colonoscopy probably will need a capsule endoscopy.   Acute on chronic anemia- 2nd lower GI bleeding Anemia of chronic disease-secondary to ESRD - Patient reported she gets intermittent blood transfusion every 2 to 3 weeks due to underlying ESRD. - Presenting with above complaint. - Hemoglobin 7.3.  Baseline hemoglobin around 8-8.5. - Transfusing 1 unit of blood tonight.  Checking H&H 3 times daily and goal to keep hemoglobin above 8. -Continue oral iron supplement twice daily.   ESRD on dialysis MWF schedule - Patient reported she has last dialysis on Monday and Tuesday.  Patient is not volume overloaded.  No urgent need for dialysis tonight. -Consulted nephrology Dr. Allena Katz, plan for dialysis tomorrow.   Essential hypertension -Blood pressure is well-controlled - Continue amlodipine 10 mg daily, Coreg to 25 mg twice daily and hydralazine 75 mg 3 times  daily.  Non-insulin-dependent DM type II-diet controlled -Hemoglobin A1c 4.7 in August 2024. -At home patient is not any antidiabetic agent.  Diabetes diet controlled now.  Holding any insulin regimen while in the hospital.  GERD -In the setting of GI bleed giving IV Protonix 40 mg twice daily.  Gastroparesis -Patient denies any nausea vomiting.  Continue to monitor.  Restless leg syndrome -Continue ropinirole 1 mg at bedtime  Peripheral neuropathy -Continue gabapentin 300 mg 3 times daily  History of upper lower extremity shakiness -Patient follows neurology working up for diagnosis of Parkinson disease.  And currently treating with Sinemet. - Continue Sinemet.  History of OSA on CPAP - Ordered CPAP at bedtime  Insomnia - Our facility does not carry lusenta and patient is requesting for sleep medication at bedtime.  Ordered Ambien 5 mg at bedtime.  DVT prophylaxis:  SCDs.  Deferring pharmacological prophylaxis in the setting of GI bleed Code Status:  Full Code Diet:  Family Communication:  Family was present at bedside, at the time of interview.  Opportunity was given to ask question and all questions were answered satisfactorily.  Disposition Plan: Tentative discharge to home next 2 to 3 days Consults:   Gastroenterology and nephrology Admission status:   Inpatient, Step Down Unit  Severity of Illness: The appropriate patient status for this patient is INPATIENT. Inpatient status is judged to be reasonable and necessary in order to provide the required intensity of service to ensure the patient's safety. The patient's presenting symptoms, physical exam findings, and initial radiographic and laboratory data in the context of their chronic comorbidities is felt to place them at high risk for further clinical deterioration. Furthermore, it is not anticipated that the patient will be medically stable for discharge from the hospital within 2 midnights of admission.   * I certify  that at the point of admission it is my clinical judgment that the patient will require inpatient hospital care spanning beyond 2 midnights from the point of admission due to high intensity of service, high risk for further deterioration and high frequency of surveillance required.Marland Kitchen    Tereasa Coop, MD [facility]  How to contact the [facility] Attending or Consulting provider 7A - 7P or covering provider during after hours 7P -7A, for this patient.  Check the care team in [facility] and look for a) attending/consulting [facility] provider listed and b) the [facility] team listed Log into [email or web address] and use [facility]'s universal password to access. If you do not have the password, please contact the hospital operator. Locate the [facility] provider you are looking for under [facility] and page to a number that you can be directly reached. If you still have difficulty reaching the provider, please page the [facility] (Director on Call) for the Hospitalists listed on amion for assistance.  01/01/2023, 6:15 AM

## 2023-01-01 ENCOUNTER — Inpatient Hospital Stay (HOSPITAL_COMMUNITY): Payer: 59

## 2023-01-01 DIAGNOSIS — K922 Gastrointestinal hemorrhage, unspecified: Secondary | ICD-10-CM | POA: Diagnosis not present

## 2023-01-01 DIAGNOSIS — Z992 Dependence on renal dialysis: Secondary | ICD-10-CM | POA: Diagnosis not present

## 2023-01-01 DIAGNOSIS — K625 Hemorrhage of anus and rectum: Principal | ICD-10-CM

## 2023-01-01 DIAGNOSIS — N186 End stage renal disease: Secondary | ICD-10-CM | POA: Diagnosis not present

## 2023-01-01 DIAGNOSIS — D500 Iron deficiency anemia secondary to blood loss (chronic): Secondary | ICD-10-CM

## 2023-01-01 DIAGNOSIS — R933 Abnormal findings on diagnostic imaging of other parts of digestive tract: Secondary | ICD-10-CM

## 2023-01-01 LAB — COMPREHENSIVE METABOLIC PANEL
ALT: 6 U/L (ref 0–44)
AST: 14 U/L — ABNORMAL LOW (ref 15–41)
Albumin: 3 g/dL — ABNORMAL LOW (ref 3.5–5.0)
Alkaline Phosphatase: 59 U/L (ref 38–126)
Anion gap: 16 — ABNORMAL HIGH (ref 5–15)
BUN: 59 mg/dL — ABNORMAL HIGH (ref 6–20)
CO2: 22 mmol/L (ref 22–32)
Calcium: 9 mg/dL (ref 8.9–10.3)
Chloride: 100 mmol/L (ref 98–111)
Creatinine, Ser: 10.5 mg/dL — ABNORMAL HIGH (ref 0.44–1.00)
GFR, Estimated: 4 mL/min — ABNORMAL LOW (ref >60)
Glucose, Bld: 101 mg/dL — ABNORMAL HIGH (ref 70–99)
Potassium: 4.6 mmol/L (ref 3.5–5.1)
Sodium: 138 mmol/L (ref 135–145)
Total Bilirubin: 0.5 mg/dL (ref <1.2)
Total Protein: 6.5 g/dL (ref 6.5–8.1)

## 2023-01-01 LAB — CBG MONITORING, ED
Glucose-Capillary: 110 mg/dL — ABNORMAL HIGH (ref 70–99)
Glucose-Capillary: 102 mg/dL — ABNORMAL HIGH (ref 70–99)

## 2023-01-01 LAB — CBC
HCT: 25.1 % — ABNORMAL LOW (ref 36.0–46.0)
Hemoglobin: 7.8 g/dL — ABNORMAL LOW (ref 12.0–15.0)
MCH: 27 pg (ref 26.0–34.0)
MCHC: 31.1 g/dL (ref 30.0–36.0)
MCV: 86.9 fL (ref 80.0–100.0)
Platelets: 255 10*3/uL (ref 150–400)
RBC: 2.89 MIL/uL — ABNORMAL LOW (ref 3.87–5.11)
RDW: 19.2 % — ABNORMAL HIGH (ref 11.5–15.5)
WBC: 7.9 10*3/uL (ref 4.0–10.5)
nRBC: 0 % (ref 0.0–0.2)

## 2023-01-01 MED ORDER — METAMUCIL SMOOTH TEXTURE 58.6 % PO POWD: 1.0000 | Freq: Three times a day (TID) | ORAL | 12 refills | Status: DC

## 2023-01-01 MED ORDER — POLYETHYLENE GLYCOL 3350 17 G PO PACK: 17.0000 g | PACK | Freq: Every day | ORAL | Status: AC | PRN

## 2023-01-01 MED ORDER — PREGABALIN 25 MG PO CAPS
75.0000 mg | ORAL_CAPSULE | Freq: Every day | ORAL | Status: DC
Start: 2023-01-01 — End: 2023-01-01
  Administered 2023-01-01: 75 mg via ORAL
  Filled 2023-01-01: qty 3

## 2023-01-01 MED ORDER — PREGABALIN 25 MG PO CAPS
25.0000 mg | ORAL_CAPSULE | Freq: Three times a day (TID) | ORAL | Status: DC
  Administered 2023-01-01: 25 mg via ORAL
  Filled 2023-01-01: qty 1

## 2023-01-01 MED ORDER — PREGABALIN 75 MG PO CAPS: 75.0000 mg | ORAL_CAPSULE | Freq: Every day | ORAL | 0 refills | Status: DC

## 2023-01-01 MED ORDER — HYDRALAZINE HCL 25 MG PO TABS
25.0000 mg | ORAL_TABLET | Freq: Three times a day (TID) | ORAL | Status: DC
  Administered 2023-01-01 (×2): 25 mg via ORAL
  Filled 2023-01-01 (×2): qty 1

## 2023-01-01 NOTE — Progress Notes (Addendum)
Contacted by nephrologist with request for pt to receive out-pt HD later today or tomorrow. Contacted [facility] to inquire about options. Clinic cannot treat pt today and clinic staff  will contact navigator later this afternoon once they know if they have an appt available for tomorrow. Update provided to nephrologist and team. Will assist as needed.   Olivia Canter Renal Navigator [phone]  Addendum at 2:09 pm: Received a call from [facility] to say that clinic can treat pt tomorrow. Pt will need to arrive at 9:20 am for 9:40 am chair time. Spoke to pt via phone to make pt aware of this info. Pt agreeable to appt tomorrow. Update provided to attending, nephrologist, and other team members. Appt added to AVS as well.

## 2023-01-01 NOTE — Progress Notes (Addendum)
GI NP at bedside NPO until further orders.

## 2023-01-01 NOTE — Care Management CC44 (Signed)
Condition Code 44 Documentation Completed  Patient Details  Name: Patricia [name] MRN: [id number] Date of Birth: [birth year]   Condition Code 44 given:  Yes Patient signature on Condition Code 44 notice:  Yes Documentation of 2 MD's agreement:  Yes Code 44 added to claim:  Yes    Lavenia Atlas, RN 01/01/2023, 1:31 PM

## 2023-01-01 NOTE — ED Notes (Signed)
PT declines CBG

## 2023-01-01 NOTE — Consult Note (Signed)
Referring Provider: Dr. Burnadette Pop Primary Care Physician:  Rick Duff, PA-C Primary Gastroenterologist:  Dr. Eather Colas  Reason for Consultation:  GI bleed   HPI: Patricia [name] is a 47 y.o. female with a past medical history of anxiety, hypertension, hyperlipidemia, CHF, ESRD on HD every M-W-F, chronic iron deficiency anemia, restless leg syndrome, gallstones, GERD, hiatal hernia, small bowel AVMs, diverticulosis, colon polyps and GI bleed 10/2022.  She was previously admitted to the hospital 10/14 - 10/23/2022 with hematochezia.  Admission hemoglobin 8.1.  CTAP showed diverticular disease without diverticulitis with a questionable fistulous communication between the rectum and sigmoid colon.  She underwent a colonoscopy 10/22/2022 which showed diverticulosis and internal hemorrhoids without active bleeding.  Lower GI bleed was thought to be diverticular versus hemorrhoidal.  Her hemoglobin level dropped to 7.1 and she received 1 unit of PRBCs during this admission.  Her hemoglobin level notably dropped during dialysis 12/16/2022.  She was sent to [facility] ED which showed hemoglobin level of 5.5 and she was transfused 2 units of PRBCs and discharged home.  She was asymptomatic without active GI bleeding at that time.  She had recurrent bright red hematochezia 12/31/2022 therefore she presented to [facility] ED for further evaluation.  Labs in the ED showed a hemoglobin level of 7.3 with a baseline hemoglobin level 7.5-8.0. Hematocrit 24.7.  MCV 86.7.  Platelet 298.  Sodium 136.  Potassium 4.7.  BUN 49 up from 17 two weeks ago.  Creatinine 9.28.  Total bili 0.3.  Alk phos 66.  AST 34.  ALT < 5.  FOBT ordered.  Transfused 1 unit of PRBCs.  Posttransfusion hemoglobin 7.8.  CTAP without contrast showed diffuse colonic diverticulosis without active diverticulitis with a possible fistula between the rectum and mid sigmoid colon, unchanged when compared to prior CT as  noted above.  CT also noted small layering gallstones without biliary ductal dilatation.  She endorsed having constipation and mild lower abdominal pain for 2 to 3 days then on Christmas evening around 6 PM she went to the bathroom and passed a normal bowel movement and shortly after went back to the bathroom and passed a moderate amount of bright red blood with blood clots.  She continued to pass 2-3 normal formed brown stools on 12/26 with intermittent episodes of passing only bright red blood per the rectum with intermittent clots.  No rectal pain.  No NSAID use.  She is not on oral anticoagulation.  No GERD symptoms or dysphagia.  No alcohol use.  No further hematochezia since arriving to the ED.  She passed a normal brown bowel movement earlier this morning.  She has undergone an extensive GI evaluation in 20 19 through 20 20 due to having iron deficiency anemia, see summary of GI procedures below.  GI PROCEDURES:  Colonoscopy 10/22/2022 as an inpatient at [facility] by Dr. Eather Colas: - Preparation of the colon was fair.  - Diverticulosis in the sigmoid colon, in the descending colon and in the transverse colon.  - Internal hemorrhoids.  - The examination was otherwise normal on direct and retroflexion views.  - No specimens collected.  EGD secondary to dysphagia, N/V 04/27/2022 as outpatient at [facility]: - 2 cm hiatal hernia.  - Granular and texture changed mucosa in the gastric fundus and gastric body. Polypoid lesion that's representative of the abnormal mucosa was resected.  - Normal examined duodenum. - Biopsies were taken with a cold forceps for Helicobacter pylori testing.  Small bowel enteroscopy  07/05/2018: A small area of superficial bleeding at tongue tip was noted prior to endoscope insertion Normal esophagus A large amount of food residue in the stomach Normal examined duodenum 2 nonbleeding angiectasia's in the jejunum treated with APC The examination portion of the ileum was  normal No specimens collected  EGD 02/18/2018 by Dr. Meridee Score: - No gross lesions in esophagus. Biopsied.  - 3 cm hiatal hernia. Gastroesophageal flap valve classified as Hill Grade IV (no fold, wide open lumen, hiatal hernia present).  - No gross lesions in the stomach. Previous ulcer has healed.  - No gross lesions in the duodenal bulb, in the first portion of the duodenum, in the second portion of the duodenum, in the third portion of the duodenum and in the fourth portion of the duodenum. Prominent Ampulla.  - Normal mucosa was found in the proximal jejunum. Tattooed distal most extent of enteroscopy today. - At some point in future may consider the role of a duodenoscope view of the prominent ampulla  Small bowel endoscopy 12/24/2017: - No gross lesions in esophagus. Z-line irregular, 38 cm from the incisors. - Non-bleeding gastric ulcer with a clean ulcer base (Forrest Class III). Non-bleeding erosive gastropathy. No other gross lesions in the stomach. Biopsied for HP. - Erythematous duodenopathy in bulb. - Normal mucosa was found in the second portion of the duodenum, in the third portion of the duodenum and in the fourth portion of the duodenum. - Normal mucosa was found in the jejunum. Tattooed distal extent to demarcate distal extent of Push SBE. - Successful completion of the Video Capsule Enteroscope placement  Small bowel capsule endoscopy 12/24/2017: Incomplete capsule endoscopy, capsule remained in small bowel at last image 13 hours 51 minutes There are 2-3 small nonbleeding AVMs noted between 2 hours 34 minutes and 3 hours 9 minutes these are more than 2 hours beyond first duodenal image these are too distal to approach with regular enteroscopy  Colonoscopy 09/04/2017: - Congested and erythematous mucosa in the sigmoid colon, in the transverse colon and in the ascending colon. Mucosal friability noted in ascending colon and cecum, no active bleeding. - Diverticulosis in the sigmoid  colon and in the descending colon. - The distal rectum and anal verge are normal on retroflexion view. - Two 3 to 8 mm polyps in the transverse colon, removed piecemeal using a cold biopsy forceps. Resected and retrieved.  EGD 09/03/2017: - Esophagogastric landmarks identified.  - Non-obstructing Schatzki ring.  - Small hiatal hernia.  - Abnormal esophageal motility.  - Gastritis. Biopsied.  - Duodenitis.  - Normal second portion of the duodenum.  Upper endoscopy 06/04/2017: - Medium sized hiatal hernia - Gastritis.  - Duodenitis.  - Suspect gastric erosions, gastritis, duodenitis, could lead to abdominal pain and melena and anemia.    ECHO 07/23/2022: IMPRESSIONS Left ventricular ejection fraction, by estimation, is 60 to 65%. The left ventricle has normal function. The left ventricle has no regional wall motion abnormalities. Left ventricular diastolic parameters are indeterminate. 1. Right ventricular systolic function is normal. The right ventricular size is normal. There is severely elevated pulmonary artery systolic pressure. The estimated right ventricular systolic pressure is 60.8 mmHg. 2. 3. Left atrial size was moderately dilated. 4. Right atrial size was mildly dilated. The mitral valve is normal in structure. Mild mitral valve regurgitation. No evidence of mitral stenosis. 5. 6. Tricuspid valve regurgitation is mild to moderate. The aortic valve is normal in structure. Aortic valve regurgitation is not visualized. No aortic stenosis is  present. 7. The inferior vena cava is normal in size with greater than 50% respiratory variability, suggesting right atrial pressure of 3 mmHg.  Past Medical History:  Diagnosis Date   Acid reflux    Anemia 04/2019   REQUIRING BLOOD TRANSFUSIONS   Anxiety    Arthritis    CHF (congestive heart failure) (HCC)    Dyspnea    with exertion   Dysrhythmia    tachycardia   ESRD (end stage renal disease) (HCC)    [facility] [place]   Facial twitching  11/2020   mouth twitch, eyes roll back, neurology started patient on low dose   Sinemet IR.   GI bleeding 12/2017   Gout    Headache(784.0)    "related to chemo; sometimes weekly" (09/12/2013)   Heart murmur    Systolic per Dr Orpah Cobb' notes   High cholesterol    History of blood transfusion "a couple"   "related to low counts"   Hypertension    Iron deficiency anemia    "get epogen shots q month" (02/20/2014)   MCGN (minimal change glomerulonephritis)    "using chemo to tx;  finished my last tx in 12/2013"   Neuropathy    Peritoneal dialysis status (HCC)    Stroke (HCC) 08/15/2017   ruled out that it was not a stroke    Past Surgical History:  Procedure Laterality Date   ABDOMINAL HYSTERECTOMY  2010   "laparoscopic"   ANKLE FRACTURE SURGERY Right 1994   AV FISTULA PLACEMENT Left 04/14/2016   Procedure: ARTERIOVENOUS (AV) FISTULA CREATION;  Surgeon: Chuck Hint, MD;  Location: [facility] OR;  Service: Vascular;  Laterality: Left;   AV FISTULA PLACEMENT Left 08/09/2017   Procedure: INSERTION OF ARTERIOVENOUS (AV) GORE-TEX GRAFT LEFT ARM;  Surgeon: Sherren Kerns, MD;  Location: [facility] OR;  Service: Vascular;  Laterality: Left;   AV FISTULA PLACEMENT Right 03/15/2020   Procedure: RIGHT ARM ARTERIOVENOUS (AV) FISTULA CREATION 1st Stage Basilic Transposition;  Surgeon: Nada Libman, MD;  Location: [facility] OR;  Service: Vascular;  Laterality: Right;   AV FISTULA PLACEMENT Right 02/24/2021   Procedure: RIGHT ARM ARTERIOVENOUS  FISTULA CREATION;  Surgeon: Victorino Sparrow, MD;  Location: [facility] OR;  Service: Vascular;  Laterality: Right;  regional block   AVGG REMOVAL Left 08/11/2017   Procedure: REMOVAL OF ARTERIOVENOUS GORETEX GRAFT (AVGG);  Surgeon: Nada Libman, MD;  Location: [facility] OR;  Service: Vascular;  Laterality: Left;   BASCILIC VEIN TRANSPOSITION Right 05/17/2020   Procedure: RIGHT UPPER EXTREMITY SECOND STAGE BASCILIC VEIN TRANSPOSITION;  Surgeon: Nada Libman, MD;  Location:  [facility] OR;  Service: Vascular;  Laterality: Right;   BIOPSY  09/03/2017   Procedure: BIOPSY;  Surgeon: Kathi Der, MD;  Location: [facility] ENDOSCOPY;  Service: Gastroenterology;;   BIOPSY  12/24/2017   Procedure: BIOPSY;  Surgeon: Lemar Lofty., MD;  Location: [facility] ENDOSCOPY;  Service: Gastroenterology;;   CARDIAC CATHETERIZATION  2000's   COLONOSCOPY N/A 10/22/2022   Procedure: COLONOSCOPY;  Surgeon: Regis Bill, MD;  Location: [facility] ENDOSCOPY;  Service: Endoscopy;  Laterality: N/A;   COLONOSCOPY WITH PROPOFOL N/A 09/04/2017   Procedure: COLONOSCOPY WITH PROPOFOL;  Surgeon: Kerin Salen, MD;  Location: [facility] ENDOSCOPY;  Service: Gastroenterology;  Laterality: N/A;   ENTEROSCOPY N/A 12/24/2017   Procedure: ENTEROSCOPY;  Surgeon: Meridee Score Netty Starring., MD;  Location: [facility] ENDOSCOPY;  Service: Gastroenterology;  Laterality: N/A;   ESOPHAGOGASTRODUODENOSCOPY     ESOPHAGOGASTRODUODENOSCOPY (EGD) WITH PROPOFOL Left 06/04/2017   Procedure: ESOPHAGOGASTRODUODENOSCOPY (EGD)  WITH PROPOFOL;  Surgeon: Willis Modena, MD;  Location: [facility] ENDOSCOPY;  Service: Endoscopy;  Laterality: Left;   ESOPHAGOGASTRODUODENOSCOPY (EGD) WITH PROPOFOL N/A 09/03/2017   Procedure: ESOPHAGOGASTRODUODENOSCOPY (EGD) WITH PROPOFOL;  Surgeon: Kathi Der, MD;  Location: [facility] ENDOSCOPY;  Service: Gastroenterology;  Laterality: N/A;   ESOPHAGOGASTRODUODENOSCOPY (EGD) WITH PROPOFOL N/A 04/27/2022   Procedure: ESOPHAGOGASTRODUODENOSCOPY (EGD) WITH PROPOFOL;  Surgeon: Regis Bill, MD;  Location: [facility] ENDOSCOPY;  Service: Endoscopy;  Laterality: N/A;   FISTULA SUPERFICIALIZATION Left 04/02/2017   Procedure: FISTULA SUPERFICIALIZATION LEFT ARM;  Surgeon: Nada Libman, MD;  Location: [facility] OR;  Service: Vascular;  Laterality: Left;   FISTULA SUPERFICIALIZATION Right 04/14/2021   Procedure: RIGHT ARM FISTULA SUPERFICIALIZATION;  Surgeon: Victorino Sparrow, MD;  Location: [facility] OR;  Service: Vascular;  Laterality: Right;   PERIPHERAL NERVE BLOCK   FISTULOGRAM Left 04/07/2016   Procedure: FISTULOGRAM;  Surgeon: Maeola Harman, MD;  Location: [facility] OR;  Service: Vascular;  Laterality: Left;   FRACTURE SURGERY     right ankle   GIVENS CAPSULE STUDY N/A 10/29/2017   Procedure: GIVENS CAPSULE STUDY;  Surgeon: Charlott Rakes, MD;  Location: [facility] ENDOSCOPY;  Service: Endoscopy;  Laterality: N/A;   GIVENS CAPSULE STUDY N/A 12/24/2017   Procedure: GIVENS CAPSULE STUDY;  Surgeon: Lemar Lofty., MD;  Location: [facility] ENDOSCOPY;  Service: Gastroenterology;  Laterality: N/A;   GIVENS CAPSULE STUDY N/A 05/22/2018   Procedure: GIVENS CAPSULE STUDY;  Surgeon: Lemar Lofty., MD;  Location: [facility] ENDOSCOPY;  Service: Gastroenterology;  Laterality: N/A;   INSERTION OF DIALYSIS CATHETER Right 04/07/2016   Procedure: INSERTION OF DIALYSIS CATHETER;  Surgeon: Maeola Harman, MD;  Location: [facility] OR;  Service: Vascular;  Laterality: Right;   INSERTION OF DIALYSIS CATHETER Right 04/04/2017   Procedure: INSERTION OF DIALYSIS CATHETER;  Surgeon: Maeola Harman, MD;  Location: [facility] OR;  Service: Vascular;  Laterality: Right;   IR FLUORO GUIDE CV LINE LEFT  03/13/2020   IR US GUIDE VASC ACCESS LEFT  03/13/2020   LEFT HEART CATH AND CORONARY ANGIOGRAPHY N/A 05/12/2019   Procedure: LEFT HEART CATH AND CORONARY ANGIOGRAPHY;  Surgeon: Orpah Cobb, MD;  [facility]: [facility] INVASIVE CV LAB;  Service: Cardiovascular;  Laterality: N/A;   LIGATION OF ARTERIOVENOUS  FISTULA Left 04/14/2016   Procedure: LIGATION OF ARTERIOVENOUS  FISTULA;  Surgeon: Chuck Hint, MD;  Location: [facility] OR;  Service: Vascular;  Laterality: Left;   LIGATION OF COMPETING BRANCHES OF ARTERIOVENOUS FISTULA Right 05/17/2020   Procedure: LIGATION OF COMPETING BRANCHES OF RIGHT ARM ARTERIOVENOUS FISTULA;  Surgeon: Nada Libman, MD;  Location: [facility] OR;  Service: Vascular;  Laterality: Right;   PATCH ANGIOPLASTY Left 06/19/2016   Procedure: PATCH  ANGIOPLASTY LEFT ARTERIOVENOUS FISTULA;  Surgeon: Chuck Hint, MD;  Location: [facility] OR;  Service: Vascular;  Laterality: Left;   POLYPECTOMY  09/04/2017   Procedure: POLYPECTOMY;  Surgeon: Kerin Salen, MD;  Location: [facility] ENDOSCOPY;  Service: Gastroenterology;;   REVISON OF ARTERIOVENOUS FISTULA Left 06/19/2016   Procedure: REVISION SUPERFICIALIZATION OF BRACHIOCEPHALIC ARTERIOVENOUS FISTULA;  Surgeon: Chuck Hint, MD;  Location: [facility] OR;  Service: Vascular;  Laterality: Left;   UPPER EXTREMITY VENOGRAPHY Bilateral 02/19/2021   Procedure: UPPER EXTREMITY VENOGRAPHY;  Surgeon: Victorino Sparrow, MD;  Location: [facility] INVASIVE CV LAB;  Service: Cardiovascular;  Laterality: Bilateral;    Prior to Admission medications   Medication Sig Start Date End Date Taking? Authorizing Provider  acetaminophen (TYLENOL) 500 MG tablet Take 1,000 mg by mouth every 6 (six)  hours as needed for moderate pain.   Yes [provider]  allopurinol (ZYLOPRIM) 100 MG tablet Take 100 mg by mouth at bedtime.    Yes [provider]  ALPRAZolam Prudy Feeler) 1 MG tablet Take 1 mg by mouth 3 (three) times daily as needed for anxiety or sleep.   Yes [provider]  amLODipine (NORVASC) 10 MG tablet Take 1 tablet (10 mg total) by mouth daily. Patient taking differently: Take 10 mg by mouth at bedtime. 07/25/22  Yes Wouk, Wilfred Curtis, MD  ascorbic acid (VITAMIN C) 500 MG tablet Take 1 tablet (500 mg total) by mouth 2 (two) times daily. Patient taking differently: Take 500 mg by mouth daily. 08/13/22  Yes Arnetha Courser, MD  carbidopa-levodopa (SINEMET IR) 25-100 MG tablet TAKE 1 TABLET BY MOUTH THREE TIMES DAILY 12/24/22  Yes Levert Feinstein, MD  carvedilol (COREG) 25 MG tablet Take 1 tablet (25 mg total) by mouth 2 (two) times daily with a meal. 07/24/22  Yes Wouk, Wilfred Curtis, MD  eszopiclone (LUNESTA) 1 MG TABS tablet Take 1 mg by mouth at bedtime. 09/17/22  Yes [provider]  gabapentin  (NEURONTIN) 100 MG capsule TAKE 3 CAPSULES(300 MG) BY MOUTH THREE TIMES DAILY 11/17/22  Yes Levert Feinstein, MD  hydrALAZINE (APRESOLINE) 25 MG tablet Take 3 tablets (75 mg total) by mouth every 8 (eight) hours. 08/13/22  Yes Arnetha Courser, MD  hydrOXYzine (ATARAX/VISTARIL) 25 MG tablet Take 25 mg by mouth 3 (three) times daily.   Yes [provider]  isosorbide mononitrate (IMDUR) 60 MG 24 hr tablet Take 1 tablet (60 mg total) by mouth every evening. Skip the dose if systolic BP less than 140 mmHg 05/13/22 05/13/23 Yes Gillis Santa, MD  lanthanum (FOSRENOL) 1000 MG chewable tablet Chew 1,000-2,000 mg by mouth See admin instructions. Take 2 tablets (2000mg ) by mouth three times daily with meals and take 1 tablet (1000mg ) by mouth twice daily with snacks   Yes [provider]  Lidocaine-Menthol (NERVIVE ROLL-ON EX) Apply 1 Application topically daily. Apply to bottom of feet for neuropathy related pain   Yes [provider]  ondansetron (ZOFRAN) 4 MG tablet Take 4 mg by mouth every 8 (eight) hours as needed for vomiting or nausea. 11/17/22  Yes [provider]  polyethylene glycol (MIRALAX) 17 g packet Take 17 g by mouth daily. Patient taking differently: Take 17 g by mouth daily as needed for mild constipation or moderate constipation. 07/01/22  Yes Pilar Jarvis, MD  promethazine (PHENERGAN) 25 MG tablet Take 25 mg by mouth 3 (three) times daily as needed for nausea or vomiting.   Yes [provider]  rOPINIRole (REQUIP) 1 MG tablet TAKE 1 TABLET(1 MG) BY MOUTH AT BEDTIME 10/26/22  Yes Levert Feinstein, MD  Vilazodone HCl (VIIBRYD) 10 MG TABS Take 10 mg by mouth daily.   Yes [provider]  DULoxetine (CYMBALTA) 30 MG capsule TAKE 1 CAPSULE(30 MG) BY MOUTH DAILY Patient not taking: Reported on 12/31/2022 10/26/22   Levert Feinstein, MD  ferrous sulfate 325 (65 FE) MG EC tablet Take 1 tablet (325 mg total) by mouth 2 (two) times daily. Patient taking differently: Take 325  mg by mouth daily with breakfast. 10/23/22 10/23/23  Arnetha Courser, MD  multivitamin (RENA-VIT) TABS tablet Take 1 tablet by mouth at bedtime.     [provider]  omeprazole (PRILOSEC) 40 MG capsule Take 1 capsule (40 mg total) by mouth daily. Patient taking differently: Take 40 mg by mouth  2 (two) times daily. 08/13/22   Arnetha Courser, MD    Current Facility-Administered Medications  Medication Dose Route Frequency Provider Last Rate Last Admin   0.9 %  sodium chloride infusion (Manually program via Guardrails IV Fluids)   Intravenous Once Janalyn Shy, Subrina, MD   Held at 01/01/23 0152   0.9 %  sodium chloride infusion  250 mL Intravenous PRN Janalyn Shy, Subrina, MD       acetaminophen (TYLENOL) tablet 650 mg  650 mg Oral Q6H PRN Janalyn Shy, Subrina, MD       Or   acetaminophen (TYLENOL) suppository 650 mg  650 mg Rectal Q6H PRN Janalyn Shy, Subrina, MD       allopurinol (ZYLOPRIM) tablet 100 mg  100 mg Oral QHS Sundil, Subrina, MD       ALPRAZolam Prudy Feeler) tablet 1 mg  1 mg Oral TID PRN Janalyn Shy, Subrina, MD   1 mg at 01/01/23 0041   amLODipine (NORVASC) tablet 10 mg  10 mg Oral Daily Sundil, Subrina, MD       carbidopa-levodopa (SINEMET IR) 25-100 MG per tablet immediate release 1 tablet  1 tablet Oral TID Janalyn Shy, Subrina, MD   1 tablet at 12/31/22 2216   carvedilol (COREG) tablet 25 mg  25 mg Oral BID WC Sundil, Subrina, MD       DULoxetine (CYMBALTA) DR capsule 30 mg  30 mg Oral Daily Sundil, Subrina, MD       ferrous sulfate tablet 325 mg  325 mg Oral BID Sundil, Subrina, MD   325 mg at 12/31/22 2216   hydrALAZINE (APRESOLINE) tablet 25 mg  25 mg Oral Q8H Sundil, Subrina, MD   25 mg at 01/01/23 0536   hydrOXYzine (ATARAX) tablet 25 mg  25 mg Oral Q6H PRN Janalyn Shy, Subrina, MD   25 mg at 01/01/23 0021   isosorbide mononitrate (IMDUR) 24 hr tablet 60 mg  60 mg Oral QPM Sundil, Subrina, MD       lanthanum Kennis Carina) chewable tablet 1,000 mg  1,000 mg Oral With snacks Sundil, Subrina, MD       lanthanum  [facility]) chewable tablet 2,000 mg  2,000 mg Oral TID WC Sundil, Subrina, MD       ondansetron [facility]) tablet 4 mg  4 mg Oral Q6H PRN Janalyn Shy, Subrina, MD       Or   ondansetron [facility]) injection 4 mg  4 mg Intravenous Q6H PRN Janalyn Shy, Subrina, MD       pantoprazole (PROTONIX) injection 40 mg  40 mg Intravenous Q12H Sundil, Subrina, MD   40 mg at 01/01/23 0201   pregabalin (LYRICA) capsule 25 mg  25 mg Oral TID Tereasa Coop, MD   25 mg at 01/01/23 6578   rOPINIRole (REQUIP) tablet 1 mg  1 mg Oral QHS Sundil, Subrina, MD   1 mg at 01/01/23 0022   senna-docusate (Senokot-S) tablet 1 tablet  1 tablet Oral QHS PRN Janalyn Shy, Subrina, MD       sodium chloride flush (NS) 0.9 % injection 3 mL  3 mL Intravenous Q12H Sundil, Subrina, MD       sodium chloride flush (NS) 0.9 % injection 3 mL  3 mL Intravenous PRN Janalyn Shy, Subrina, MD       zolpidem (AMBIEN) tablet 5 mg  5 mg Oral QHS Sundil, Subrina, MD       Current Outpatient Medications  Medication Sig Dispense Refill   acetaminophen (TYLENOL) 500 MG tablet Take 1,000 mg by mouth every 6 (six) hours as needed for  moderate pain.     allopurinol (ZYLOPRIM) 100 MG tablet Take 100 mg by mouth at bedtime.      ALPRAZolam (XANAX) 1 MG tablet Take 1 mg by mouth 3 (three) times daily as needed for anxiety or sleep.     amLODipine (NORVASC) 10 MG tablet Take 1 tablet (10 mg total) by mouth daily. (Patient taking differently: Take 10 mg by mouth at bedtime.) 30 tablet 1   ascorbic acid (VITAMIN C) 500 MG tablet Take 1 tablet (500 mg total) by mouth 2 (two) times daily. (Patient taking differently: Take 500 mg by mouth daily.) 60 tablet 0   carbidopa-levodopa (SINEMET IR) 25-100 MG tablet TAKE 1 TABLET BY MOUTH THREE TIMES DAILY 90 tablet 0   carvedilol (COREG) 25 MG tablet Take 1 tablet (25 mg total) by mouth 2 (two) times daily with a meal. 60 tablet 1   eszopiclone (LUNESTA) 1 MG TABS tablet Take 1 mg by mouth at bedtime.     gabapentin (NEURONTIN) 100 MG  capsule TAKE 3 CAPSULES(300 MG) BY MOUTH THREE TIMES DAILY 270 capsule 0   hydrALAZINE (APRESOLINE) 25 MG tablet Take 3 tablets (75 mg total) by mouth every 8 (eight) hours. 270 tablet 2   hydrOXYzine (ATARAX/VISTARIL) 25 MG tablet Take 25 mg by mouth 3 (three) times daily.     isosorbide mononitrate (IMDUR) 60 MG 24 hr tablet Take 1 tablet (60 mg total) by mouth every evening. Skip the dose if systolic BP less than 140 mmHg 30 tablet 11   lanthanum (FOSRENOL) 1000 MG chewable tablet Chew 1,000-2,000 mg by mouth See admin instructions. Take 2 tablets (2000mg ) by mouth three times daily with meals and take 1 tablet (1000mg ) by mouth twice daily with snacks     Lidocaine-Menthol (NERVIVE ROLL-ON EX) Apply 1 Application topically daily. Apply to bottom of feet for neuropathy related pain     ondansetron (ZOFRAN) 4 MG tablet Take 4 mg by mouth every 8 (eight) hours as needed for vomiting or nausea.     polyethylene glycol (MIRALAX) 17 g packet Take 17 g by mouth daily. (Patient taking differently: Take 17 g by mouth daily as needed for mild constipation or moderate constipation.) 14 each 0   promethazine (PHENERGAN) 25 MG tablet Take 25 mg by mouth 3 (three) times daily as needed for nausea or vomiting.     rOPINIRole (REQUIP) 1 MG tablet TAKE 1 TABLET(1 MG) BY MOUTH AT BEDTIME 30 tablet 6   Vilazodone HCl (VIIBRYD) 10 MG TABS Take 10 mg by mouth daily.     DULoxetine (CYMBALTA) 30 MG capsule TAKE 1 CAPSULE(30 MG) BY MOUTH DAILY (Patient not taking: Reported on 12/31/2022) 30 capsule 3   ferrous sulfate 325 (65 FE) MG EC tablet Take 1 tablet (325 mg total) by mouth 2 (two) times daily. (Patient taking differently: Take 325 mg by mouth daily with breakfast.) 60 tablet 3   multivitamin (RENA-VIT) TABS tablet Take 1 tablet by mouth at bedtime.      omeprazole (PRILOSEC) 40 MG capsule Take 1 capsule (40 mg total) by mouth daily. (Patient taking differently: Take 40 mg by mouth 2 (two) times daily.) 30 capsule  1    Allergies as of 12/31/2022 - Review Complete 12/31/2022  Allergen Reaction Noted   Nsaids Other (See Comments) 01/27/2013   Tolmetin Other (See Comments) 07/20/2015    Family History  Problem Relation Age of Onset   Hypertension Mother    Thyroid disease Mother    Coronary  artery disease Father    Hypertension Father    Diabetes Father    Colon cancer Maternal Aunt    Esophageal cancer Maternal Uncle    Inflammatory bowel disease Neg Hx    Liver disease Neg Hx    Pancreatic cancer Neg Hx    Rectal cancer Neg Hx    Stomach cancer Neg Hx     Social History   Socioeconomic History   Marital status: Single    Spouse name: Not on file   Number of children: 3   Years of education: 14   Highest education level: Associate degree: academic program  Occupational History   Occupation: disabled  Tobacco Use   Smoking status: Never   Smokeless tobacco: Never  Vaping Use   Vaping status: Never Used  Substance and Sexual Activity   Alcohol use: No   Drug use: Never   Sexual activity: Not Currently    Birth control/protection: Surgical  Other Topics Concern   Not on file  Social History Narrative   Not on file   Social Drivers of Health   Financial Resource Strain: Low Risk  (09/04/2019)   Received from [facility], [facility]   Overall Financial Resource Strain (CARDIA)    Difficulty of Paying Living Expenses: Not hard at all  Food Insecurity: No Food Insecurity (12/31/2022)   Hunger Vital Sign    Worried About Running Out of Food in the Last Year: Never true    Ran Out of Food in the Last Year: Never true  Transportation Needs: No Transportation Needs (12/31/2022)   PRAPARE - Administrator, Civil Service (Medical): No    Lack of Transportation (Non-Medical): No  Physical Activity: Insufficiently Active (09/04/2019)   Received from [facility], [facility]   Exercise Vital Sign    Days of Exercise per Week: 2 days    Minutes of Exercise per  Session: 30 min  Stress: Stress Concern Present (09/04/2019)   Received from [facility], [facility] - Occupational Stress Questionnaire    Feeling of Stress : To some extent  Social Connections: Unknown (05/10/2021)   Received from [facility], [facility]   Social Network    Social Network: Not on file  Intimate Partner Violence: Not At Risk (12/31/2022)   Humiliation, Afraid, Rape, and Kick questionnaire    Fear of Current or Ex-Partner: No    Emotionally Abused: No    Physically Abused: No    Sexually Abused: No    Review of Systems: Gen: Denies fever, sweats or chills. No weight loss.  CV: Denies chest pain, palpitations or edema. Resp: Denies cough, shortness of breath of hemoptysis.  GI: See HPI.   GU : Anuric. MS: Denies joint pain, muscles aches or weakness. Derm: Denies rash, itchiness, skin lesions or unhealing ulcers. Psych: Denies depression, anxiety, memory loss or confusion. Heme: Denies easy bruising, bleeding. Neuro:  Denies headaches, dizziness or paresthesias. Endo:  Denies any problems with DM, thyroid or adrenal function.  Physical Exam: Vital signs in last 24 hours: Temp:  [97.8 F (36.6 C)-98.6 F (37 C)] 97.8 F (36.6 C) (12/27 0530) Pulse Rate:  [67-90] 86 (12/27 0530) Resp:  [14-23] 17 (12/27 0530) BP: (120-150)/(49-105) 128/105 (12/27 0530) SpO2:  [95 %-100 %] 97 % (12/27 0530)   General: 47 year old obese female in no acute distress. Head:  Normocephalic and atraumatic. Eyes:  No scleral icterus. Conjunctiva pink. Ears:  Normal auditory acuity.  Nose:  No deformity, discharge or lesions. Mouth:  Dentition intact. No ulcers or lesions.  Neck:  Supple. No lymphadenopathy or thyromegaly.  Lungs: Breath sounds clear throughout. No wheezes, rhonchi or crackles.  Heart: Rate and rhythm, no murmurs. Abdomen: Soft, nondistended.  Tender.  Positive bowel sounds all 4 quadrants.  No palpable mass.   Scattered keloid scars.  Rectal: An anterior external anal folds.  Internal hemorrhoids without prolapse or bleeding.  Rectal vault was empty, no stool or blood.  ED RN at the bedside at time of exam. Musculoskeletal:  Symmetrical without gross deformities.  Pulses:  Normal pulses noted. 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  No lower extremity edema. Neurologic:  Alert and  oriented x 4. No focal deficits.  Skin:  Intact without significant lesions or rashes. Psych:  Alert and cooperative. Normal mood and affect.  Intake/Output from previous day: 12/26 0701 - 12/27 0700 In: 350 [Blood:350] Out: -  Intake/Output this shift: No intake/output data recorded.  Lab Results: Recent Labs    12/31/22 1652 01/01/23 0537  WBC 7.7 7.9  HGB 7.3* 7.8*  HCT 24.7* 25.1*  PLT 298 255   BMET Recent Labs    12/31/22 1652 01/01/23 0537  NA 136 138  K 4.7 4.6  CL 95* 100  CO2 24 22  GLUCOSE 96 101*  BUN 49* 59*  CREATININE 9.28* 10.50*  CALCIUM 9.3 9.0   LFT Recent Labs    01/01/23 0537  PROT 6.5  ALBUMIN 3.0*  AST 14*  ALT 6  ALKPHOS 59  BILITOT 0.5   PT/INR No results for input(s): "LABPROT", "INR" in the last 72 hours. Hepatitis Panel No results for input(s): "HEPBSAG", "HCVAB", "HEPAIGM", "HEPBIGM" in the last 72 hours.    Studies/Results: CT ABDOMEN PELVIS WO CONTRAST Result Date: 01/01/2023 CLINICAL DATA:  Rectal bleeding EXAM: CT ABDOMEN AND PELVIS WITHOUT CONTRAST TECHNIQUE: Multidetector CT imaging of the abdomen and pelvis was performed following the standard protocol without IV contrast. RADIATION DOSE REDUCTION: This exam was performed according to the departmental dose-optimization program which includes automated exposure control, adjustment of the mA and/or kV according to patient size and/or use of iterative reconstruction technique. COMPARISON:  10/19/2022 FINDINGS: Lower chest: Small hiatal hernia.  No acute findings.  Hepatobiliary: Small layering gallstones. No biliary ductal dilatation or focal hepatic abnormality. Pancreas: No focal abnormality or ductal dilatation. Spleen: No focal abnormality.  Normal size. Adrenals/Urinary Tract: Bilateral nonobstructing renal stones. No ureteral stones or hydronephrosis. Adrenal glands and urinary bladder unremarkable. Stomach/Bowel: Colonic diverticulosis no active diverticulitis. Again noted is possible fistula between the rectum and mid sigmoid colon. This is best seen on series 3, image 74 and is unchanged since prior study. Normal appendix. Stomach and small bowel decompressed, unremarkable. Vascular/Lymphatic: Aortic atherosclerosis. No evidence of aneurysm or adenopathy. Reproductive: Prior hysterectomy.  No adnexal masses. Other: No free fluid or free air. Calcification within the anterior abdomen is unchanged since prior study. Musculoskeletal: No acute bony abnormality. IMPRESSION: Diffuse colonic diverticulosis. No active diverticulitis. 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 Cholelithiasis. Aortic atherosclerosis. Bilateral nephrolithiasis. No acute findings. Electronically Signed   By: Charlett Nose M.D.   On: 01/01/2023 01:58    IMPRESSION/PLAN:  47 year old female presents with recurrent bright red hematochezia with blood clots, suspect diverticular bleed.  Hg 7.3.  Transfuse 1 unit PRBCs.  Posttransfusion hemoglobin level 7.8.  No active hematochezia since arriving to the  ED.  CTAP without contrast showed diffuse colonic diverticulosis without active diverticulitis and a possible fistula between the rectum and mid sigmoid colon. -Renal diet as tolerated -Continue to monitor patient for active GI bleeding -Consider CTA with contrast if active GI  bleeding occurs, contrast ok in setting of ESRD on HD -No plans for endoscopic evaluation as colonoscopy 10/22/2022 showed diverticulosis, internal hemorrhoids  without evidence of fistula/IBD or malignancy. -Await further recommendations per Dr. Tomasa Rand  Acute on chronic iron deficiency anemia secondary ESRD and GI bleeding  History of GERD, hiatal hernia and  gastric ulcer per EGD 2019 -Pantoprazole 40 mg p.o. daily  History of colon polyps.  Colon polyps per colonoscopy 10/2022.  CHF LVEF 60 to 65%.       Malachi Carl Kennedy-Smith  01/01/2023, 09:09 AM

## 2023-01-01 NOTE — Discharge Summary (Signed)
Physician Discharge Summary  Audi Higuchi ZOX:[id number] DOB: 08-28-1975 DOA: 12/31/2022  PCP: Rick Duff, PA-C  Admit date: 12/31/2022 Discharge date: 01/01/2023  Admitted From: Home Disposition:  Home  Discharge Condition:Stable CODE STATUS:FULL Diet recommendation: Renal  Brief/Interim Summary: Patient is a 47 year old female with history of ESRD on dialysis, anemia, non-insulin-dependent diabetes type 2, anemia, internal hemorrhoids, GI bleeding, depression/anxiety, hypertension who presented with complaint of bright red blood per rectum, total of 5 episodes.  On patient seen, she was hemodynamically stable.  Also complaining of suprapubic/lower abdominal pain.  On presentation, hemoglobin was 7.3.  Given a unit of blood transfusion.  GI, nephrology consulted .  Hemoglobin stable this morning.  She is having brown-colored stools.  Her lower GI bleed is suspected from hemorrhoids and it has resolved.  GI cleared for discharge.  Following problems were addressed during the hospitalization:  Lower GI bleed/history of internal hemorrhoids: Presented with several episodes of bright blood red blood per rectum.  Baseline hemoglobin around 8-9.  Presented with hemoglobin in the range of 7.3.  Given a unit of blood transfusion.   Last colonoscopy in October 24 showed diverticulosis, no active bleeding.  GI consulted and following. 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 She is passing brown-colored stool now.  Hemoglobin stable this morning.  Lower GI bleed suspected to be from internal hemorrhoids.  GI cleared for discharge.  Recommended Metamucil. We recommend to follow-up with PCP in a week and do CBC test to check hemoglobin   ESRD on dialysis: Dialyzed on MWF schedule.  Could not get a slot for dialysis here.  She looks euvolemic.  Renal navigator  communicated with her outpatient dialysis center, dialysis scheduled for tomorrow morning   Hypertension: Currently blood pressure well-controlled.  Takes amlodipine, Coreg, hydralazine at home.Continue    Non-insulin-dependent diabetes type 2: Currently not on antidiabetic medication   GERD: Continue PPI.   Restless leg syndrome: Currently on ropinirole   Peripheral neuropathy: On gabapentin.  She says it does not work, changed to pregabalin   History of OSA: On CPAP   Discharge Diagnoses:  Principal Problem:   Lower GI bleed Active Problems:   ESRD on dialysis (HCC)   Acute on chronic anemia   Essential hypertension   Peripheral neuropathy   Non-insulin dependent type 2 diabetes mellitus (HCC)   GERD (gastroesophageal reflux disease)   GAD (generalized anxiety disorder)   Restless leg syndrome   Gastroparesis   OSA on CPAP    Discharge Instructions  Discharge Instructions     Diet - low sodium heart healthy   Complete by: As directed    Renal diet   Discharge instructions   Complete by: As directed    1)Follow up with your PCP in a week and do a CBC test to check your hemoglobin. 2)Follow up at your dialysis center tomorrow for dialysis session   Increase activity slowly   Complete by: As directed       Allergies as of 01/01/2023       Reactions   Nsaids Other (See Comments)   Cannot take due to Kidney disease/Kidney function Other Reaction(s): Unknown   Tolmetin Other (See Comments)   Cannot take due to Kidney Disease        Medication List     STOP taking these medications    gabapentin 100 MG capsule Commonly known as: NEURONTIN  TAKE these medications    acetaminophen 500 MG tablet Commonly known as: TYLENOL Take 1,000 mg by mouth every 6 (six) hours as needed for moderate pain.   allopurinol 100 MG tablet Commonly known as: ZYLOPRIM Take 100 mg by mouth at bedtime.   ALPRAZolam 1 MG tablet Commonly known as: XANAX Take 1 mg  by mouth 3 (three) times daily as needed for anxiety or sleep.   amLODipine 10 MG tablet Commonly known as: NORVASC Take 1 tablet (10 mg total) by mouth daily. What changed: when to take this   ascorbic acid 500 MG tablet Commonly known as: VITAMIN C Take 1 tablet (500 mg total) by mouth 2 (two) times daily. What changed: when to take this   carbidopa-levodopa 25-100 MG tablet Commonly known as: SINEMET IR TAKE 1 TABLET BY MOUTH THREE TIMES DAILY   carvedilol 25 MG tablet Commonly known as: COREG Take 1 tablet (25 mg total) by mouth 2 (two) times daily with a meal.   DULoxetine 30 MG capsule Commonly known as: CYMBALTA TAKE 1 CAPSULE(30 MG) BY MOUTH DAILY   eszopiclone 1 MG Tabs tablet Commonly known as: LUNESTA Take 1 mg by mouth at bedtime.   ferrous sulfate 325 (65 FE) MG EC tablet Take 1 tablet (325 mg total) by mouth 2 (two) times daily.   hydrALAZINE 25 MG tablet Commonly known as: APRESOLINE Take 3 tablets (75 mg total) by mouth every 8 (eight) hours.   hydrOXYzine 25 MG tablet Commonly known as: ATARAX Take 25 mg by mouth 3 (three) times daily.   isosorbide mononitrate 60 MG 24 hr tablet Commonly known as: IMDUR Take 1 tablet (60 mg total) by mouth every evening. Skip the dose if systolic BP less than 140 mmHg   lanthanum 1000 MG chewable tablet Commonly known as: FOSRENOL Chew 1,000-2,000 mg by mouth See admin instructions. Take 2 tablets (2000mg ) by mouth three times daily with meals and take 1 tablet (1000mg ) by mouth twice daily with snacks   Metamucil Smooth Texture 58.6 % powder Generic drug: psyllium Take 1 packet by mouth 3 (three) times daily.   multivitamin Tabs tablet Take 1 tablet by mouth at bedtime.   NERVIVE ROLL-ON EX Apply 1 Application topically daily. Apply to bottom of feet for neuropathy related pain   omeprazole 40 MG capsule Commonly known as: PRILOSEC Take 1 capsule (40 mg total) by mouth daily.   ondansetron 4 MG  tablet Commonly known as: ZOFRAN Take 4 mg by mouth every 8 (eight) hours as needed for vomiting or nausea.   polyethylene glycol 17 g packet Commonly known as: MiraLax Take 17 g by mouth daily as needed for mild constipation or moderate constipation.   pregabalin 75 MG capsule Commonly known as: LYRICA Take 1 capsule (75 mg total) by mouth daily. Start taking on: January 02, 2023   promethazine 25 MG tablet Commonly known as: PHENERGAN Take 25 mg by mouth 3 (three) times daily as needed for nausea or vomiting.   rOPINIRole 1 MG tablet Commonly known as: REQUIP TAKE 1 TABLET(1 MG) BY MOUTH AT BEDTIME   Vilazodone HCl 10 MG Tabs Commonly known as: VIIBRYD Take 10 mg by mouth daily.        Follow-up Information     [facility]. Go on 01/02/2023.   Why: Arrive at 9:20 am for 9:40 am chair time. Contact information: [address] [place] [address] [phone]  Allergies  Allergen Reactions   Nsaids Other (See Comments)    Cannot take due to Kidney disease/Kidney function  Other Reaction(s): Unknown   Tolmetin Other (See Comments)    Cannot take due to Kidney Disease    Consultations: Nephrology, GI   Procedures/Studies: CT ABDOMEN PELVIS WO CONTRAST Result Date: 01/01/2023 CLINICAL DATA:  Rectal bleeding EXAM: CT ABDOMEN AND PELVIS WITHOUT CONTRAST TECHNIQUE: Multidetector CT imaging of the abdomen and pelvis was performed following the standard protocol without IV contrast. RADIATION DOSE REDUCTION: This exam was performed according to the departmental dose-optimization program which includes automated exposure control, adjustment of the mA and/or kV according to patient size and/or use of iterative reconstruction technique. COMPARISON:  10/19/2022 FINDINGS: Lower chest: Small hiatal hernia.  No acute findings. Hepatobiliary: Small layering gallstones. No biliary ductal dilatation or focal hepatic abnormality. Pancreas: No  focal abnormality or ductal dilatation. Spleen: No focal abnormality.  Normal size. Adrenals/Urinary Tract: Bilateral nonobstructing renal stones. No ureteral stones or hydronephrosis. Adrenal glands and urinary bladder unremarkable. Stomach/Bowel: Colonic diverticulosis no active diverticulitis. Again noted is possible fistula between the rectum and mid sigmoid colon. This is best seen on series 3, image 74 and is unchanged since prior study. Normal appendix. Stomach and small bowel decompressed, unremarkable. Vascular/Lymphatic: Aortic atherosclerosis. No evidence of aneurysm or adenopathy. Reproductive: Prior hysterectomy.  No adnexal masses. Other: No free fluid or free air. Calcification within the anterior abdomen is unchanged since prior study. Musculoskeletal: No acute bony abnormality. IMPRESSION: Diffuse colonic diverticulosis. No active diverticulitis. 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 Cholelithiasis. Aortic atherosclerosis. Bilateral nephrolithiasis. No acute findings. Electronically Signed   By: Charlett Nose M.D.   On: 01/01/2023 01:58      Subjective: Patient seen and examined at bedside today.  Hemodynamically stable.  Comfortable without any complaints.  No bloody bowel movement.  No abdominal pain.  Eager to go home  Discharge Exam: Vitals:   01/01/23 1130 01/01/23 1131  BP: 128/88   Pulse: 71   Resp: 14   Temp:  97.8 F (36.6 C)  SpO2: 100%    Vitals:   01/01/23 0530 01/01/23 0744 01/01/23 1130 01/01/23 1131  BP: (!) 128/105 131/71 128/88   Pulse: 86 84 71   Resp: 17 16 14    Temp: 97.8 F (36.6 C) 98.5 F (36.9 C)  97.8 F (36.6 C)  TempSrc:  Oral  Oral  SpO2: 97% 98% 100%   Weight:  111.1 kg    Height:  5\' 7"  (1.702 m)      General: Pt is alert, awake, not in acute distress, morbidly obese Cardiovascular: RRR, S1/S2 +, no rubs, no gallops Respiratory: CTA bilaterally, no wheezing, no  rhonchi Abdominal: Soft, NT, ND, bowel sounds + Extremities: no edema, no cyanosis    The results of significant diagnostics from this hospitalization (including imaging, microbiology, ancillary and laboratory) are listed below for reference.     Microbiology: No results found for this or any previous visit (from the past 240 hours).   Labs: BNP (last 3 results) Recent Labs    05/11/22 0056 07/23/22 1050  BNP 624.9* 885.2*   Basic Metabolic Panel: Recent Labs  Lab 12/31/22 1652 01/01/23 0537  NA 136 138  K 4.7 4.6  CL 95* 100  CO2 24 22  GLUCOSE 96 101*  BUN 49* 59*  CREATININE 9.28* 10.50*  CALCIUM 9.3 9.0   Liver Function Tests: Recent Labs  Lab 12/31/22 1652 01/01/23 0537  AST 34 14*  ALT <5 6  ALKPHOS 66 59  BILITOT 0.3 0.5  PROT 7.3 6.5  ALBUMIN 3.5 3.0*   No results for input(s): "LIPASE", "AMYLASE" in the last 168 hours. No results for input(s): "AMMONIA" in the last 168 hours. CBC: Recent Labs  Lab 12/31/22 1652 01/01/23 0537  WBC 7.7 7.9  HGB 7.3* 7.8*  HCT 24.7* 25.1*  MCV 86.7 86.9  PLT 298 255   Cardiac Enzymes: No results for input(s): "CKTOTAL", "CKMB", "CKMBINDEX", "TROPONINI" in the last 168 hours. BNP: Invalid input(s): "POCBNP" CBG: Recent Labs  Lab 01/01/23 0320 01/01/23 0915  GLUCAP 110* 102*   D-Dimer No results for input(s): "DDIMER" in the last 72 hours. Hgb A1c No results for input(s): "HGBA1C" in the last 72 hours. Lipid Profile No results for input(s): "CHOL", "HDL", "LDLCALC", "TRIG", "CHOLHDL", "LDLDIRECT" in the last 72 hours. Thyroid function studies No results for input(s): "TSH", "T4TOTAL", "T3FREE", "THYROIDAB" in the last 72 hours.  Invalid input(s): "FREET3" Anemia work up No results for input(s): "VITAMINB12", "FOLATE", "FERRITIN", "TIBC", "IRON", "RETICCTPCT" in the last 72 hours. Urinalysis    Component Value Date/Time   COLORURINE COLORLESS (A) 04/03/2018 2319   APPEARANCEUR CLEAR 04/03/2018  2319   LABSPEC 1.006 04/03/2018 2319   PHURINE 8.0 04/03/2018 2319   GLUCOSEU >=500 (A) 04/03/2018 2319   HGBUR NEGATIVE 04/03/2018 2319   BILIRUBINUR NEGATIVE 04/03/2018 2319   KETONESUR NEGATIVE 04/03/2018 2319   PROTEINUR 100 (A) 04/03/2018 2319   UROBILINOGEN 0.2 09/19/2014 1620   NITRITE NEGATIVE 04/03/2018 2319   LEUKOCYTESUR NEGATIVE 04/03/2018 2319   Sepsis Labs Recent Labs  Lab 12/31/22 1652 01/01/23 0537  WBC 7.7 7.9   Microbiology No results found for this or any previous visit (from the past 240 hours).  Please note: You were cared for by a hospitalist during your hospital stay. Once you are discharged, your primary care physician will handle any further medical issues. Please note that NO REFILLS for any discharge medications will be authorized once you are discharged, as it is imperative that you return to your primary care physician (or establish a relationship with a primary care physician if you do not have one) for your post hospital discharge needs so that they can reassess your need for medications and monitor your lab values.    Time coordinating discharge: 40 minutes  SIGNED:   Burnadette Pop, MD  [facility] 01/01/2023, 2:18 PM Pager [phone]  If 7PM-7AM, please contact night-coverage [email or web address] Password [email or web address]

## 2023-01-01 NOTE — Care Management Obs Status (Signed)
MEDICARE OBSERVATION STATUS NOTIFICATION   Patient Details  Name: Patricia [name] MRN: [id number] Date of Birth: [birth year]   Medicare Observation Status Notification Given:  Yes    Lavenia Atlas, RN 01/01/2023, 1:31 PM

## 2023-01-01 NOTE — Progress Notes (Signed)
Neuropathic pain: Even though patient takes gabapentin at home she does not want to take gabapentin while in the hospital and states she is requesting for Lyrica which she used to take before.  I have changed gabapentin to Lyrica.

## 2023-01-03 LAB — TYPE AND SCREEN
ABO/RH(D): O NEG
Antibody Screen: NEGATIVE
Unit division: 0
Unit division: 0

## 2023-01-03 LAB — BPAM RBC
Blood Product Expiration Date: 202501042359
Blood Product Expiration Date: 202501052359
ISSUE DATE / TIME: 202412262041
ISSUE DATE / TIME: 202412262041
Unit Type and Rh: 9500
Unit Type and Rh: 9500

## 2023-01-23 ENCOUNTER — Other Ambulatory Visit: Payer: Self-pay | Admitting: Neurology

## 2023-02-04 ENCOUNTER — Other Ambulatory Visit
Admission: RE | Admit: 2023-02-04 | Discharge: 2023-02-04 | Disposition: A | Discharge status: Home / Self Care | Payer: 59 | Attending: Nephrology | Admitting: Nephrology

## 2023-02-04 DIAGNOSIS — B259 Cytomegaloviral disease, unspecified: Secondary | ICD-10-CM | POA: Diagnosis not present

## 2023-02-04 DIAGNOSIS — Z09 Encounter for follow-up examination after completed treatment for conditions other than malignant neoplasm: Secondary | ICD-10-CM | POA: Insufficient documentation

## 2023-02-04 DIAGNOSIS — E559 Vitamin D deficiency, unspecified: Secondary | ICD-10-CM | POA: Diagnosis not present

## 2023-02-04 DIAGNOSIS — Z9483 Pancreas transplant status: Secondary | ICD-10-CM | POA: Insufficient documentation

## 2023-02-04 DIAGNOSIS — Z940 Kidney transplant status: Secondary | ICD-10-CM | POA: Insufficient documentation

## 2023-02-04 DIAGNOSIS — Z79899 Other long term (current) drug therapy: Secondary | ICD-10-CM | POA: Diagnosis not present

## 2023-02-04 DIAGNOSIS — Z789 Other specified health status: Secondary | ICD-10-CM | POA: Diagnosis not present

## 2023-02-04 DIAGNOSIS — D899 Disorder involving the immune mechanism, unspecified: Secondary | ICD-10-CM | POA: Insufficient documentation

## 2023-02-04 DIAGNOSIS — Z114 Encounter for screening for human immunodeficiency virus [HIV]: Secondary | ICD-10-CM | POA: Diagnosis not present

## 2023-02-04 DIAGNOSIS — D631 Anemia in chronic kidney disease: Secondary | ICD-10-CM | POA: Insufficient documentation

## 2023-02-04 DIAGNOSIS — E1129 Type 2 diabetes mellitus with other diabetic kidney complication: Secondary | ICD-10-CM | POA: Diagnosis not present

## 2023-02-04 DIAGNOSIS — N390 Urinary tract infection, site not specified: Secondary | ICD-10-CM | POA: Diagnosis not present

## 2023-02-04 LAB — CBC WITH DIFFERENTIAL/PLATELET
Abs Immature Granulocytes: 0.11 10*3/uL — ABNORMAL HIGH (ref 0.00–0.07)
Basophils Absolute: 0 10*3/uL (ref 0.0–0.1)
Basophils Relative: 0 %
Eosinophils Absolute: 0 10*3/uL (ref 0.0–0.5)
Eosinophils Relative: 0 %
HCT: 23.4 % — ABNORMAL LOW (ref 36.0–46.0)
Hemoglobin: 7.5 g/dL — ABNORMAL LOW (ref 12.0–15.0)
Immature Granulocytes: 1 %
Lymphocytes Relative: 5 %
Lymphs Abs: 0.4 10*3/uL — ABNORMAL LOW (ref 0.7–4.0)
MCH: 25.2 pg — ABNORMAL LOW (ref 26.0–34.0)
MCHC: 32.1 g/dL (ref 30.0–36.0)
MCV: 78.5 fL — ABNORMAL LOW (ref 80.0–100.0)
Monocytes Absolute: 0.6 10*3/uL (ref 0.1–1.0)
Monocytes Relative: 7 %
Neutro Abs: 6.9 10*3/uL (ref 1.7–7.7)
Neutrophils Relative %: 87 %
Platelets: 167 10*3/uL (ref 150–400)
RBC: 2.98 MIL/uL — ABNORMAL LOW (ref 3.87–5.11)
RDW: 19.3 % — ABNORMAL HIGH (ref 11.5–15.5)
WBC: 8 10*3/uL (ref 4.0–10.5)
nRBC: 1.1 % — ABNORMAL HIGH (ref 0.0–0.2)

## 2023-02-04 LAB — BASIC METABOLIC PANEL
Anion gap: 14 (ref 5–15)
BUN: 41 mg/dL — ABNORMAL HIGH (ref 6–20)
CO2: 28 mmol/L (ref 22–32)
Calcium: 9.3 mg/dL (ref 8.9–10.3)
Chloride: 95 mmol/L — ABNORMAL LOW (ref 98–111)
Creatinine, Ser: 5.86 mg/dL — ABNORMAL HIGH (ref 0.44–1.00)
GFR, Estimated: 8 mL/min — ABNORMAL LOW (ref >60)
Glucose, Bld: 155 mg/dL — ABNORMAL HIGH (ref 70–99)
Potassium: 3.2 mmol/L — ABNORMAL LOW (ref 3.5–5.1)
Sodium: 137 mmol/L (ref 135–145)

## 2023-02-04 LAB — MAGNESIUM: Magnesium: 2.3 mg/dL (ref 1.7–2.4)

## 2023-02-04 LAB — PHOSPHORUS: Phosphorus: 5.4 mg/dL — ABNORMAL HIGH (ref 2.5–4.6)

## 2023-02-07 LAB — TACROLIMUS LEVEL: Tacrolimus (FK506) - LabCorp: 13.1 ng/mL (ref 2.0–20.0)

## 2023-02-08 ENCOUNTER — Other Ambulatory Visit
Admission: RE | Admit: 2023-02-08 | Discharge: 2023-02-08 | Disposition: A | Discharge status: Home / Self Care | Payer: 59 | Attending: Nephrology | Admitting: Nephrology

## 2023-02-08 DIAGNOSIS — E559 Vitamin D deficiency, unspecified: Secondary | ICD-10-CM | POA: Insufficient documentation

## 2023-02-08 DIAGNOSIS — D631 Anemia in chronic kidney disease: Secondary | ICD-10-CM | POA: Insufficient documentation

## 2023-02-08 DIAGNOSIS — D899 Disorder involving the immune mechanism, unspecified: Secondary | ICD-10-CM | POA: Insufficient documentation

## 2023-02-08 DIAGNOSIS — Z09 Encounter for follow-up examination after completed treatment for conditions other than malignant neoplasm: Secondary | ICD-10-CM | POA: Diagnosis not present

## 2023-02-08 DIAGNOSIS — Z114 Encounter for screening for human immunodeficiency virus [HIV]: Secondary | ICD-10-CM | POA: Insufficient documentation

## 2023-02-08 DIAGNOSIS — Z9483 Pancreas transplant status: Secondary | ICD-10-CM | POA: Insufficient documentation

## 2023-02-08 DIAGNOSIS — Z79899 Other long term (current) drug therapy: Secondary | ICD-10-CM | POA: Diagnosis present

## 2023-02-08 DIAGNOSIS — E1122 Type 2 diabetes mellitus with diabetic chronic kidney disease: Secondary | ICD-10-CM | POA: Diagnosis not present

## 2023-02-08 DIAGNOSIS — B259 Cytomegaloviral disease, unspecified: Secondary | ICD-10-CM | POA: Diagnosis not present

## 2023-02-08 DIAGNOSIS — N189 Chronic kidney disease, unspecified: Secondary | ICD-10-CM | POA: Diagnosis not present

## 2023-02-08 DIAGNOSIS — Z789 Other specified health status: Secondary | ICD-10-CM | POA: Diagnosis not present

## 2023-02-08 DIAGNOSIS — Z940 Kidney transplant status: Secondary | ICD-10-CM | POA: Insufficient documentation

## 2023-02-08 DIAGNOSIS — N390 Urinary tract infection, site not specified: Secondary | ICD-10-CM | POA: Insufficient documentation

## 2023-02-08 LAB — CBC WITH DIFFERENTIAL/PLATELET
Abs Immature Granulocytes: 0.09 10*3/uL — ABNORMAL HIGH (ref 0.00–0.07)
Basophils Absolute: 0 10*3/uL (ref 0.0–0.1)
Basophils Relative: 0 %
Eosinophils Absolute: 0.2 10*3/uL (ref 0.0–0.5)
Eosinophils Relative: 3 %
HCT: 23.4 % — ABNORMAL LOW (ref 36.0–46.0)
Hemoglobin: 7.3 g/dL — ABNORMAL LOW (ref 12.0–15.0)
Immature Granulocytes: 1 %
Lymphocytes Relative: 4 %
Lymphs Abs: 0.3 10*3/uL — ABNORMAL LOW (ref 0.7–4.0)
MCH: 24.5 pg — ABNORMAL LOW (ref 26.0–34.0)
MCHC: 31.2 g/dL (ref 30.0–36.0)
MCV: 78.5 fL — ABNORMAL LOW (ref 80.0–100.0)
Monocytes Absolute: 0.5 10*3/uL (ref 0.1–1.0)
Monocytes Relative: 8 %
Neutro Abs: 5.3 10*3/uL (ref 1.7–7.7)
Neutrophils Relative %: 84 %
Platelets: 196 10*3/uL (ref 150–400)
RBC: 2.98 MIL/uL — ABNORMAL LOW (ref 3.87–5.11)
RDW: 20.4 % — ABNORMAL HIGH (ref 11.5–15.5)
WBC: 6.4 10*3/uL (ref 4.0–10.5)
nRBC: 0 % (ref 0.0–0.2)

## 2023-02-08 LAB — BASIC METABOLIC PANEL
Anion gap: 18 — ABNORMAL HIGH (ref 5–15)
BUN: 78 mg/dL — ABNORMAL HIGH (ref 6–20)
CO2: 26 mmol/L (ref 22–32)
Calcium: 9 mg/dL (ref 8.9–10.3)
Chloride: 94 mmol/L — ABNORMAL LOW (ref 98–111)
Creatinine, Ser: 9.83 mg/dL — ABNORMAL HIGH (ref 0.44–1.00)
GFR, Estimated: 4 mL/min — ABNORMAL LOW (ref >60)
Glucose, Bld: 112 mg/dL — ABNORMAL HIGH (ref 70–99)
Potassium: 3.8 mmol/L (ref 3.5–5.1)
Sodium: 138 mmol/L (ref 135–145)

## 2023-02-08 LAB — PHOSPHORUS: Phosphorus: 10.1 mg/dL — ABNORMAL HIGH (ref 2.5–4.6)

## 2023-02-08 LAB — MAGNESIUM: Magnesium: 2.3 mg/dL (ref 1.7–2.4)

## 2023-02-09 LAB — TACROLIMUS LEVEL: Tacrolimus (FK506) - LabCorp: 10.5 ng/mL (ref 5.0–20.0)

## 2023-02-20 ENCOUNTER — Other Ambulatory Visit: Payer: Self-pay | Admitting: Neurology

## 2023-02-22 NOTE — Telephone Encounter (Signed)
 Dr. Terrace Arabia,  I accidentally refilled the gabapentin but I saw pt taking lyrica also so I cancelled gabapentin and wanted to check with you first. Looks like Gabapentin discontinued on 01/01/2023 by Burnadette Pop, M. And placed on lyrica. Is this what you would want them to continue? Thanks,  Production assistant, radio

## 2023-02-25 NOTE — Telephone Encounter (Signed)
 Please clarify her current med list. I saw a refill for sinemet, per note, it is not helping her, should stop.

## 2023-03-02 ENCOUNTER — Ambulatory Visit (INDEPENDENT_AMBULATORY_CARE_PROVIDER_SITE_OTHER): Payer: 59 | Admitting: Podiatry

## 2023-03-02 ENCOUNTER — Encounter: Payer: Self-pay | Admitting: Podiatry

## 2023-03-02 DIAGNOSIS — L97522 Non-pressure chronic ulcer of other part of left foot with fat layer exposed: Secondary | ICD-10-CM | POA: Diagnosis not present

## 2023-03-02 DIAGNOSIS — M216X1 Other acquired deformities of right foot: Secondary | ICD-10-CM

## 2023-03-02 NOTE — Progress Notes (Signed)
 Chief Complaint  Patient presents with   Diabetes    "I need to get this callus taken off."  (She got her Kidney transplant in January.)    HPI: 48 y.o. female PMHx ESRD on hemodialysis presenting today for follow-up evaluation of ulcers to the plantar aspect of the bilateral feet.  Patient has neuropathy and cannot feel her feet.  She is doing very well.  No new complaints  Past Medical History:  Diagnosis Date   Acid reflux    Anemia 04/2019   REQUIRING BLOOD TRANSFUSIONS   Anxiety    Arthritis    CHF (congestive heart failure) (HCC)    Dyspnea    with exertion   Dysrhythmia    tachycardia   ESRD (end stage renal disease) (HCC)    [facility]   Facial twitching 11/2020   mouth twitch, eyes roll back, neurology started patient on low dose   Sinemet IR.   GI bleeding 12/2017   Gout    Headache(784.0)    "related to chemo; sometimes weekly" (09/12/2013)   Heart murmur    Systolic per Dr Orpah Cobb' notes   High cholesterol    History of blood transfusion "a couple"   "related to low counts"   Hypertension    Iron deficiency anemia    "get epogen shots q month" (02/20/2014)   MCGN (minimal change glomerulonephritis)    "using chemo to tx;  finished my last tx in 12/2013"   Neuropathy    Peritoneal dialysis status (HCC)    Stroke (HCC) 08/15/2017   ruled out that it was not a stroke    Past Surgical History:  Procedure Laterality Date   ABDOMINAL HYSTERECTOMY  2010   "laparoscopic"   ANKLE FRACTURE SURGERY Right 1994   AV FISTULA PLACEMENT Left 04/14/2016   Procedure: ARTERIOVENOUS (AV) FISTULA CREATION;  Surgeon: Chuck Hint, MD;  Location: [facility] OR;  Service: Vascular;  Laterality: Left;   AV FISTULA PLACEMENT Left 08/09/2017   Procedure: INSERTION OF ARTERIOVENOUS (AV) GORE-TEX GRAFT LEFT ARM;  Surgeon: Sherren Kerns, MD;  [facility];  Service: Vascular;  Laterality: Left;   AV FISTULA PLACEMENT Right 03/15/2020   Procedure: RIGHT ARM  ARTERIOVENOUS (AV) FISTULA CREATION 1st Stage Basilic Transposition;  Surgeon: Nada Libman, MD;  [facility];  Service: Vascular;  Laterality: Right;   AV FISTULA PLACEMENT Right 02/24/2021   Procedure: RIGHT ARM ARTERIOVENOUS  FISTULA CREATION;  Surgeon: Victorino Sparrow, MD;  Location: [facility] OR;  Service: Vascular;  Laterality: Right;  regional block   AVGG REMOVAL Left 08/11/2017   Procedure: REMOVAL OF ARTERIOVENOUS GORETEX GRAFT (AVGG);  Surgeon: Nada Libman, MD;  Location: [facility] OR;  Service: Vascular;  Laterality: Left;   BASCILIC VEIN TRANSPOSITION Right 05/17/2020   Procedure: RIGHT UPPER EXTREMITY SECOND STAGE BASCILIC VEIN TRANSPOSITION;  Surgeon: Nada Libman, MD;  [facility];  Service: Vascular;  Laterality: Right;   BIOPSY  09/03/2017   Procedure: BIOPSY;  Surgeon: Kathi Der, MD;  [facility] ENDOSCOPY;  Service: Gastroenterology;;   BIOPSY  12/24/2017   Procedure: BIOPSY;  Surgeon: Lemar Lofty., MD;  Location: [facility] ENDOSCOPY;  Service: Gastroenterology;;   CARDIAC CATHETERIZATION  2000's   COLONOSCOPY N/A 10/22/2022   Procedure: COLONOSCOPY;  Surgeon: Regis Bill, MD;  Location: [facility] ENDOSCOPY;  Service: Endoscopy;  Laterality: N/A;   COLONOSCOPY WITH PROPOFOL N/A 09/04/2017   Procedure: COLONOSCOPY WITH PROPOFOL;  Surgeon: Kerin Salen, MD;  Location: [facility]  ENDOSCOPY;  Service: Gastroenterology;  Laterality: N/A;   ENTEROSCOPY N/A 12/24/2017   Procedure: ENTEROSCOPY;  Surgeon: Meridee Score Netty Starring., MD;  Location: [facility] ENDOSCOPY;  Service: Gastroenterology;  Laterality: N/A;   ESOPHAGOGASTRODUODENOSCOPY     ESOPHAGOGASTRODUODENOSCOPY (EGD) WITH PROPOFOL Left 06/04/2017   Procedure: ESOPHAGOGASTRODUODENOSCOPY (EGD) WITH PROPOFOL;  Surgeon: Willis Modena, MD;  Location: [facility] ENDOSCOPY;  Service: Endoscopy;  Laterality: Left;   ESOPHAGOGASTRODUODENOSCOPY (EGD) WITH PROPOFOL N/A 09/03/2017   Procedure: ESOPHAGOGASTRODUODENOSCOPY (EGD) WITH PROPOFOL;   Surgeon: Kathi Der, MD;  [facility] ENDOS[facility];  Service: Gastroenterology;  Laterality: N/A;   ESOPHAGOGASTRODUODENOSCOPY (EGD) WITH PROPOFOL N/A 04/27/2022   Procedure: ESOPHAGOGASTRODUODENOSCOPY (EGD) WITH PROPOFOL;  Surgeon: Regis Bill, MD;  Location: [facility] ENDOSCOPY;  Service: Endoscopy;  Laterality: N/A;   FISTULA SUPERFICIALIZATION Left 04/02/2017   Procedure: FISTULA SUPERFICIALIZATION LEFT ARM;  Surgeon: Nada Libman, MD;  Location: [facility] OR;  Service: Vascular;  Laterality: Left;   FISTULA SUPERFICIALIZATION Right 04/14/2021   Procedure: RIGHT ARM FISTULA SUPERFICIALIZATION;  Surgeon: Victorino Sparrow, MD;  Location: [facility] OR;  Service: Vascular;  Laterality: Right;  PERIPHERAL NERVE BLOCK   FISTULOGRAM Left 04/07/2016   Procedure: FISTULOGRAM;  Surgeon: Maeola Harman, MD;  Location: [facility] OR;  Service: Vascular;  Laterality: Left;   FRACTURE SURGERY     right ankle   GIVENS CAPSULE STUDY N/A 10/29/2017   Procedure: GIVENS CAPSULE STUDY;  Surgeon: Charlott Rakes, MD;  Location: [facility] ENDOSCOPY;  Service: Endoscopy;  Laterality: N/A;   GIVENS CAPSULE STUDY N/A 12/24/2017   Procedure: GIVENS CAPSULE STUDY;  Surgeon: Lemar Lofty., MD;  Location: [facility] ENDOSCOPY;  Service: Gastroenterology;  Laterality: N/A;   GIVENS CAPSULE STUDY N/A 05/22/2018   Procedure: GIVENS CAPSULE STUDY;  Surgeon: Lemar Lofty., MD;  Location: [facility] ENDOSCOPY;  Service: Gastroenterology;  Laterality: N/A;   INSERTION OF DIALYSIS CATHETER Right 04/07/2016   Procedure: INSERTION OF DIALYSIS CATHETER;  Surgeon: Maeola Harman, MD;  Location: [facility] OR;  Service: Vascular;  Laterality: Right;   INSERTION OF DIALYSIS CATHETER Right 04/04/2017   Procedure: INSERTION OF DIALYSIS CATHETER;  Surgeon: Maeola Harman, MD;  Location: [facility] OR;  Service: Vascular;  Laterality: Right;   IR FLUORO GUIDE CV LINE LEFT  03/13/2020   IR US GUIDE VASC ACCESS LEFT  03/13/2020   LEFT HEART CATH  AND CORONARY ANGIOGRAPHY N/A 05/12/2019   Procedure: LEFT HEART CATH AND CORONARY ANGIOGRAPHY;  Surgeon: Orpah Cobb, MD;  [facility] INVASIVE CV LAB;  Service: Cardiovascular;  Laterality: N/A;   LIGATION OF ARTERIOVENOUS  FISTULA Left 04/14/2016   Procedure: LIGATION OF ARTERIOVENOUS  FISTULA;  Surgeon: Chuck Hint, MD;  Location: [facility] OR;  Service: Vascular;  Laterality: Left;   LIGATION OF COMPETING BRANCHES OF ARTERIOVENOUS FISTULA Right 05/17/2020   Procedure: LIGATION OF COMPETING BRANCHES OF RIGHT ARM ARTERIOVENOUS FISTULA;  Surgeon: Nada Libman, MD;  [facility];  Service: Vascular;  Laterality: Right;   PATCH ANGIOPLASTY Left 06/19/2016   Procedure: PATCH ANGIOPLASTY LEFT ARTERIOVENOUS FISTULA;  Surgeon: Chuck Hint, MD;  Location: [facility] OR;  Service: Vascular;  Laterality: Left;   POLYPECTOMY  09/04/2017   Procedure: POLYPECTOMY;  Surgeon: Kerin Salen, MD;  Location: [facility] ENDOSCOPY;  Service: Gastroenterology;;   REVISON OF ARTERIOVENOUS FISTULA Left 06/19/2016   Procedure: REVISION SUPERFICIALIZATION OF BRACHIOCEPHALIC ARTERIOVENOUS FISTULA;  Surgeon: Chuck Hint, MD;  Location: [facility] OR;  Service: Vascular;  Laterality: Left;   UPPER EXTREMITY VENOGRAPHY Bilateral 02/19/2021   Procedure: UPPER EXTREMITY VENOGRAPHY;  Surgeon: Victorino Sparrow, MD;  Location: [facility] INVASIVE CV LAB;  Service: Cardiovascular;  Laterality: Bilateral;    Allergies  Allergen Reactions   Nsaids Other (See Comments)    Cannot take due to Kidney disease/Kidney function  Other Reaction(s): Unknown   Tolmetin Other (See Comments)    Cannot take due to Kidney Disease    B/L feet 04/14/2022  Physical Exam: General: The patient is alert and oriented x3 in no acute distress.  Dermatology: Callus lesions noted to the weightbearing surfaces of the bilateral feet.  No open wounds.  After debridement today there is an ulcer to the plantar aspect of the left forefoot during approximately  0.6 from 0.6 from 0.2 cm.  Granular wound base.  No exposed bone.  Good potential for healing  Vascular: Palpable pedal pulses bilaterally. Capillary refill within normal limits.  Negative for any significant edema or erythema  Neurological: Light touch and protective threshold absent  Musculoskeletal Exam: No pedal deformities noted.  No prior amputations.   Assessment: 1.  Ulcer left forefoot with fat layer exposed 2.  Preulcerative callus right plantar fifth MTP 3.  Recent kidney transplant recipient.  January 2025 4.  Plantarflexed metatarsals bilateral   Plan of Care:  -Patient evaluated.  -Continue custom molded accommodative insoles - Continue wearing good supportive shoes and slippers even around the house -Medically necessary excisional debridement including subcutaneous tissue was performed to the left foot ulcer.  Excisional debridement of the necrotic nonviable tissue down to healthier bleeding viable tissue was performed with postdebridement measurement same as pre-.  Recommend triple antibiotic and a Band-Aid daily -Excisional debridement of the hyperkeratotic preulcerative callus lesion to the right foot was also performed today with 312 scalpel without incident or bleeding -Ultimately I do believe the patient would benefit from surgery however for the moment she just went through surgery as a kidney transplant recipient.  We will continue conservative care until she is healthy and recovered enough to possibly pursue surgery to the feet to alleviate the chronic ulcers to the bilateral feet -Return to clinic 3 months  Felecia Shelling, DPM [facility]  Dr. Felecia Shelling, DPM    [address] [place], [place] [address]                Office [phone]  Fax [phone]

## 2023-03-08 ENCOUNTER — Other Ambulatory Visit
Admission: RE | Admit: 2023-03-08 | Discharge: 2023-03-08 | Disposition: A | Discharge status: Home / Self Care | Attending: Nephrology | Admitting: Nephrology

## 2023-03-08 DIAGNOSIS — Z79899 Other long term (current) drug therapy: Secondary | ICD-10-CM | POA: Diagnosis not present

## 2023-03-08 DIAGNOSIS — Z114 Encounter for screening for human immunodeficiency virus [HIV]: Secondary | ICD-10-CM | POA: Diagnosis not present

## 2023-03-08 DIAGNOSIS — N390 Urinary tract infection, site not specified: Secondary | ICD-10-CM | POA: Insufficient documentation

## 2023-03-08 DIAGNOSIS — E559 Vitamin D deficiency, unspecified: Secondary | ICD-10-CM | POA: Insufficient documentation

## 2023-03-08 DIAGNOSIS — Z09 Encounter for follow-up examination after completed treatment for conditions other than malignant neoplasm: Secondary | ICD-10-CM | POA: Diagnosis not present

## 2023-03-08 DIAGNOSIS — Z9483 Pancreas transplant status: Secondary | ICD-10-CM | POA: Insufficient documentation

## 2023-03-08 DIAGNOSIS — B259 Cytomegaloviral disease, unspecified: Secondary | ICD-10-CM | POA: Insufficient documentation

## 2023-03-08 DIAGNOSIS — Z789 Other specified health status: Secondary | ICD-10-CM | POA: Diagnosis not present

## 2023-03-08 DIAGNOSIS — D631 Anemia in chronic kidney disease: Secondary | ICD-10-CM | POA: Insufficient documentation

## 2023-03-08 DIAGNOSIS — D899 Disorder involving the immune mechanism, unspecified: Secondary | ICD-10-CM | POA: Insufficient documentation

## 2023-03-08 DIAGNOSIS — E1129 Type 2 diabetes mellitus with other diabetic kidney complication: Secondary | ICD-10-CM | POA: Insufficient documentation

## 2023-03-08 DIAGNOSIS — Z940 Kidney transplant status: Secondary | ICD-10-CM | POA: Diagnosis present

## 2023-03-08 LAB — CBC WITH DIFFERENTIAL/PLATELET
Abs Immature Granulocytes: 0.02 10*3/uL (ref 0.00–0.07)
Basophils Absolute: 0 10*3/uL (ref 0.0–0.1)
Basophils Relative: 1 %
Eosinophils Absolute: 0.1 10*3/uL (ref 0.0–0.5)
Eosinophils Relative: 2 %
HCT: 31.4 % — ABNORMAL LOW (ref 36.0–46.0)
Hemoglobin: 9.8 g/dL — ABNORMAL LOW (ref 12.0–15.0)
Immature Granulocytes: 1 %
Lymphocytes Relative: 12 %
Lymphs Abs: 0.4 10*3/uL — ABNORMAL LOW (ref 0.7–4.0)
MCH: 24.4 pg — ABNORMAL LOW (ref 26.0–34.0)
MCHC: 31.2 g/dL (ref 30.0–36.0)
MCV: 78.1 fL — ABNORMAL LOW (ref 80.0–100.0)
Monocytes Absolute: 0.3 10*3/uL (ref 0.1–1.0)
Monocytes Relative: 10 %
Neutro Abs: 2.7 10*3/uL (ref 1.7–7.7)
Neutrophils Relative %: 74 %
Platelets: 272 10*3/uL (ref 150–400)
RBC: 4.02 MIL/uL (ref 3.87–5.11)
RDW: 18.4 % — ABNORMAL HIGH (ref 11.5–15.5)
WBC: 3.5 10*3/uL — ABNORMAL LOW (ref 4.0–10.5)
nRBC: 0 % (ref 0.0–0.2)

## 2023-03-08 LAB — BASIC METABOLIC PANEL
Anion gap: 6 (ref 5–15)
BUN: 42 mg/dL — ABNORMAL HIGH (ref 6–20)
CO2: 20 mmol/L — ABNORMAL LOW (ref 22–32)
Calcium: 9.8 mg/dL (ref 8.9–10.3)
Chloride: 113 mmol/L — ABNORMAL HIGH (ref 98–111)
Creatinine, Ser: 1.48 mg/dL — ABNORMAL HIGH (ref 0.44–1.00)
GFR, Estimated: 43 mL/min — ABNORMAL LOW (ref >60)
Glucose, Bld: 126 mg/dL — ABNORMAL HIGH (ref 70–99)
Potassium: 4.6 mmol/L (ref 3.5–5.1)
Sodium: 139 mmol/L (ref 135–145)

## 2023-03-08 LAB — MAGNESIUM: Magnesium: 1.2 mg/dL — ABNORMAL LOW (ref 1.7–2.4)

## 2023-03-08 LAB — PHOSPHORUS: Phosphorus: 1.4 mg/dL — ABNORMAL LOW (ref 2.5–4.6)

## 2023-03-10 ENCOUNTER — Other Ambulatory Visit
Admission: RE | Admit: 2023-03-10 | Discharge: 2023-03-10 | Disposition: A | Discharge status: Home / Self Care | Attending: Nephrology | Admitting: Nephrology

## 2023-03-10 DIAGNOSIS — D899 Disorder involving the immune mechanism, unspecified: Secondary | ICD-10-CM | POA: Diagnosis present

## 2023-03-10 DIAGNOSIS — Z9483 Pancreas transplant status: Secondary | ICD-10-CM | POA: Diagnosis not present

## 2023-03-10 DIAGNOSIS — T8610 Unspecified complication of kidney transplant: Secondary | ICD-10-CM | POA: Diagnosis not present

## 2023-03-10 DIAGNOSIS — Z940 Kidney transplant status: Secondary | ICD-10-CM | POA: Diagnosis not present

## 2023-03-10 DIAGNOSIS — Z09 Encounter for follow-up examination after completed treatment for conditions other than malignant neoplasm: Secondary | ICD-10-CM | POA: Insufficient documentation

## 2023-03-10 DIAGNOSIS — E1129 Type 2 diabetes mellitus with other diabetic kidney complication: Secondary | ICD-10-CM | POA: Insufficient documentation

## 2023-03-10 DIAGNOSIS — Z789 Other specified health status: Secondary | ICD-10-CM | POA: Insufficient documentation

## 2023-03-10 DIAGNOSIS — Z114 Encounter for screening for human immunodeficiency virus [HIV]: Secondary | ICD-10-CM | POA: Insufficient documentation

## 2023-03-10 DIAGNOSIS — B259 Cytomegaloviral disease, unspecified: Secondary | ICD-10-CM | POA: Insufficient documentation

## 2023-03-10 DIAGNOSIS — Z79899 Other long term (current) drug therapy: Secondary | ICD-10-CM | POA: Insufficient documentation

## 2023-03-10 DIAGNOSIS — N390 Urinary tract infection, site not specified: Secondary | ICD-10-CM | POA: Insufficient documentation

## 2023-03-10 DIAGNOSIS — D631 Anemia in chronic kidney disease: Secondary | ICD-10-CM | POA: Diagnosis not present

## 2023-03-10 DIAGNOSIS — E559 Vitamin D deficiency, unspecified: Secondary | ICD-10-CM | POA: Diagnosis not present

## 2023-03-10 LAB — BASIC METABOLIC PANEL
Anion gap: 8 (ref 5–15)
BUN: 41 mg/dL — ABNORMAL HIGH (ref 6–20)
CO2: 19 mmol/L — ABNORMAL LOW (ref 22–32)
Calcium: 9.6 mg/dL (ref 8.9–10.3)
Chloride: 112 mmol/L — ABNORMAL HIGH (ref 98–111)
Creatinine, Ser: 1.47 mg/dL — ABNORMAL HIGH (ref 0.44–1.00)
GFR, Estimated: 43 mL/min — ABNORMAL LOW (ref >60)
Glucose, Bld: 114 mg/dL — ABNORMAL HIGH (ref 70–99)
Potassium: 4.4 mmol/L (ref 3.5–5.1)
Sodium: 139 mmol/L (ref 135–145)

## 2023-03-10 LAB — CBC WITH DIFFERENTIAL/PLATELET
Abs Immature Granulocytes: 0.02 10*3/uL (ref 0.00–0.07)
Basophils Absolute: 0 10*3/uL (ref 0.0–0.1)
Basophils Relative: 1 %
Eosinophils Absolute: 0.1 10*3/uL (ref 0.0–0.5)
Eosinophils Relative: 2 %
HCT: 32.9 % — ABNORMAL LOW (ref 36.0–46.0)
Hemoglobin: 10 g/dL — ABNORMAL LOW (ref 12.0–15.0)
Immature Granulocytes: 1 %
Lymphocytes Relative: 13 %
Lymphs Abs: 0.6 10*3/uL — ABNORMAL LOW (ref 0.7–4.0)
MCH: 23.9 pg — ABNORMAL LOW (ref 26.0–34.0)
MCHC: 30.4 g/dL (ref 30.0–36.0)
MCV: 78.7 fL — ABNORMAL LOW (ref 80.0–100.0)
Monocytes Absolute: 0.4 10*3/uL (ref 0.1–1.0)
Monocytes Relative: 8 %
Neutro Abs: 3.2 10*3/uL (ref 1.7–7.7)
Neutrophils Relative %: 75 %
Platelets: 247 10*3/uL (ref 150–400)
RBC: 4.18 MIL/uL (ref 3.87–5.11)
RDW: 18.4 % — ABNORMAL HIGH (ref 11.5–15.5)
WBC: 4.3 10*3/uL (ref 4.0–10.5)
nRBC: 0 % (ref 0.0–0.2)

## 2023-03-10 LAB — TACROLIMUS LEVEL: Tacrolimus (FK506) - LabCorp: 5.8 ng/mL (ref 5.0–20.0)

## 2023-03-10 LAB — MAGNESIUM: Magnesium: 1.4 mg/dL — ABNORMAL LOW (ref 1.7–2.4)

## 2023-03-10 LAB — PHOSPHORUS: Phosphorus: 1.6 mg/dL — ABNORMAL LOW (ref 2.5–4.6)

## 2023-03-12 LAB — TACROLIMUS LEVEL: Tacrolimus (FK506) - LabCorp: 6.5 ng/mL (ref 5.0–20.0)

## 2023-03-15 ENCOUNTER — Other Ambulatory Visit
Admission: RE | Admit: 2023-03-15 | Discharge: 2023-03-15 | Disposition: A | Discharge status: Home / Self Care | Attending: Nephrology | Admitting: Nephrology

## 2023-03-15 DIAGNOSIS — N390 Urinary tract infection, site not specified: Secondary | ICD-10-CM | POA: Insufficient documentation

## 2023-03-15 DIAGNOSIS — B259 Cytomegaloviral disease, unspecified: Secondary | ICD-10-CM | POA: Insufficient documentation

## 2023-03-15 DIAGNOSIS — D631 Anemia in chronic kidney disease: Secondary | ICD-10-CM | POA: Insufficient documentation

## 2023-03-15 DIAGNOSIS — Z9483 Pancreas transplant status: Secondary | ICD-10-CM | POA: Insufficient documentation

## 2023-03-15 DIAGNOSIS — Z789 Other specified health status: Secondary | ICD-10-CM | POA: Insufficient documentation

## 2023-03-15 DIAGNOSIS — E1129 Type 2 diabetes mellitus with other diabetic kidney complication: Secondary | ICD-10-CM | POA: Diagnosis not present

## 2023-03-15 DIAGNOSIS — Z79899 Other long term (current) drug therapy: Secondary | ICD-10-CM | POA: Diagnosis not present

## 2023-03-15 DIAGNOSIS — T8610 Unspecified complication of kidney transplant: Secondary | ICD-10-CM | POA: Diagnosis not present

## 2023-03-15 DIAGNOSIS — D899 Disorder involving the immune mechanism, unspecified: Secondary | ICD-10-CM | POA: Diagnosis present

## 2023-03-15 DIAGNOSIS — Z114 Encounter for screening for human immunodeficiency virus [HIV]: Secondary | ICD-10-CM | POA: Insufficient documentation

## 2023-03-15 DIAGNOSIS — Z940 Kidney transplant status: Secondary | ICD-10-CM | POA: Insufficient documentation

## 2023-03-15 DIAGNOSIS — E559 Vitamin D deficiency, unspecified: Secondary | ICD-10-CM | POA: Diagnosis not present

## 2023-03-15 DIAGNOSIS — Z09 Encounter for follow-up examination after completed treatment for conditions other than malignant neoplasm: Secondary | ICD-10-CM | POA: Diagnosis not present

## 2023-03-15 LAB — CBC WITH DIFFERENTIAL/PLATELET
Abs Immature Granulocytes: 0.03 10*3/uL (ref 0.00–0.07)
Basophils Absolute: 0 10*3/uL (ref 0.0–0.1)
Basophils Relative: 0 %
Eosinophils Absolute: 0.1 10*3/uL (ref 0.0–0.5)
Eosinophils Relative: 2 %
HCT: 31.6 % — ABNORMAL LOW (ref 36.0–46.0)
Hemoglobin: 9.6 g/dL — ABNORMAL LOW (ref 12.0–15.0)
Immature Granulocytes: 1 %
Lymphocytes Relative: 11 %
Lymphs Abs: 0.5 10*3/uL — ABNORMAL LOW (ref 0.7–4.0)
MCH: 24.4 pg — ABNORMAL LOW (ref 26.0–34.0)
MCHC: 30.4 g/dL (ref 30.0–36.0)
MCV: 80.2 fL (ref 80.0–100.0)
Monocytes Absolute: 0.3 10*3/uL (ref 0.1–1.0)
Monocytes Relative: 6 %
Neutro Abs: 3.8 10*3/uL (ref 1.7–7.7)
Neutrophils Relative %: 80 %
Platelets: 252 10*3/uL (ref 150–400)
RBC: 3.94 MIL/uL (ref 3.87–5.11)
RDW: 18.6 % — ABNORMAL HIGH (ref 11.5–15.5)
WBC: 4.6 10*3/uL (ref 4.0–10.5)
nRBC: 0 % (ref 0.0–0.2)

## 2023-03-15 LAB — BASIC METABOLIC PANEL
Anion gap: 7 (ref 5–15)
BUN: 30 mg/dL — ABNORMAL HIGH (ref 6–20)
CO2: 24 mmol/L (ref 22–32)
Calcium: 9.7 mg/dL (ref 8.9–10.3)
Chloride: 108 mmol/L (ref 98–111)
Creatinine, Ser: 1.59 mg/dL — ABNORMAL HIGH (ref 0.44–1.00)
GFR, Estimated: 40 mL/min — ABNORMAL LOW (ref >60)
Glucose, Bld: 97 mg/dL (ref 70–99)
Potassium: 5.6 mmol/L — ABNORMAL HIGH (ref 3.5–5.1)
Sodium: 139 mmol/L (ref 135–145)

## 2023-03-15 LAB — MAGNESIUM: Magnesium: 1.4 mg/dL — ABNORMAL LOW (ref 1.7–2.4)

## 2023-03-15 LAB — PHOSPHORUS: Phosphorus: 2 mg/dL — ABNORMAL LOW (ref 2.5–4.6)

## 2023-03-16 LAB — TACROLIMUS LEVEL: Tacrolimus (FK506) - LabCorp: 8.1 ng/mL (ref 5.0–20.0)

## 2023-03-18 ENCOUNTER — Other Ambulatory Visit
Admission: RE | Admit: 2023-03-18 | Discharge: 2023-03-18 | Disposition: A | Discharge status: Home / Self Care | Attending: Nephrology | Admitting: Nephrology

## 2023-03-18 DIAGNOSIS — D631 Anemia in chronic kidney disease: Secondary | ICD-10-CM | POA: Insufficient documentation

## 2023-03-18 DIAGNOSIS — Z79899 Other long term (current) drug therapy: Secondary | ICD-10-CM | POA: Insufficient documentation

## 2023-03-18 DIAGNOSIS — B9689 Other specified bacterial agents as the cause of diseases classified elsewhere: Secondary | ICD-10-CM | POA: Insufficient documentation

## 2023-03-18 DIAGNOSIS — Z789 Other specified health status: Secondary | ICD-10-CM | POA: Diagnosis not present

## 2023-03-18 DIAGNOSIS — B259 Cytomegaloviral disease, unspecified: Secondary | ICD-10-CM | POA: Insufficient documentation

## 2023-03-18 DIAGNOSIS — E559 Vitamin D deficiency, unspecified: Secondary | ICD-10-CM | POA: Insufficient documentation

## 2023-03-18 DIAGNOSIS — Z9483 Pancreas transplant status: Secondary | ICD-10-CM | POA: Insufficient documentation

## 2023-03-18 DIAGNOSIS — D899 Disorder involving the immune mechanism, unspecified: Secondary | ICD-10-CM | POA: Diagnosis not present

## 2023-03-18 DIAGNOSIS — N390 Urinary tract infection, site not specified: Secondary | ICD-10-CM | POA: Diagnosis not present

## 2023-03-18 DIAGNOSIS — Z940 Kidney transplant status: Secondary | ICD-10-CM | POA: Insufficient documentation

## 2023-03-18 DIAGNOSIS — Z09 Encounter for follow-up examination after completed treatment for conditions other than malignant neoplasm: Secondary | ICD-10-CM | POA: Diagnosis present

## 2023-03-18 DIAGNOSIS — E1129 Type 2 diabetes mellitus with other diabetic kidney complication: Secondary | ICD-10-CM | POA: Insufficient documentation

## 2023-03-18 DIAGNOSIS — Z114 Encounter for screening for human immunodeficiency virus [HIV]: Secondary | ICD-10-CM | POA: Insufficient documentation

## 2023-03-18 DIAGNOSIS — N189 Chronic kidney disease, unspecified: Secondary | ICD-10-CM | POA: Insufficient documentation

## 2023-03-18 LAB — CBC WITH DIFFERENTIAL/PLATELET
Abs Immature Granulocytes: 0.02 10*3/uL (ref 0.00–0.07)
Basophils Absolute: 0 10*3/uL (ref 0.0–0.1)
Basophils Relative: 1 %
Eosinophils Absolute: 0.1 10*3/uL (ref 0.0–0.5)
Eosinophils Relative: 1 %
HCT: 31.6 % — ABNORMAL LOW (ref 36.0–46.0)
Hemoglobin: 9.7 g/dL — ABNORMAL LOW (ref 12.0–15.0)
Immature Granulocytes: 0 %
Lymphocytes Relative: 10 %
Lymphs Abs: 0.5 10*3/uL — ABNORMAL LOW (ref 0.7–4.0)
MCH: 24.1 pg — ABNORMAL LOW (ref 26.0–34.0)
MCHC: 30.7 g/dL (ref 30.0–36.0)
MCV: 78.4 fL — ABNORMAL LOW (ref 80.0–100.0)
Monocytes Absolute: 0.3 10*3/uL (ref 0.1–1.0)
Monocytes Relative: 7 %
Neutro Abs: 4.1 10*3/uL (ref 1.7–7.7)
Neutrophils Relative %: 81 %
Platelets: 253 10*3/uL (ref 150–400)
RBC: 4.03 MIL/uL (ref 3.87–5.11)
RDW: 18.9 % — ABNORMAL HIGH (ref 11.5–15.5)
WBC: 5 10*3/uL (ref 4.0–10.5)
nRBC: 0 % (ref 0.0–0.2)

## 2023-03-18 LAB — MAGNESIUM: Magnesium: 1.5 mg/dL — ABNORMAL LOW (ref 1.7–2.4)

## 2023-03-18 LAB — BASIC METABOLIC PANEL
Anion gap: 11 (ref 5–15)
BUN: 33 mg/dL — ABNORMAL HIGH (ref 6–20)
CO2: 23 mmol/L (ref 22–32)
Calcium: 9.7 mg/dL (ref 8.9–10.3)
Chloride: 103 mmol/L (ref 98–111)
Creatinine, Ser: 1.57 mg/dL — ABNORMAL HIGH (ref 0.44–1.00)
GFR, Estimated: 40 mL/min — ABNORMAL LOW (ref >60)
Glucose, Bld: 99 mg/dL (ref 70–99)
Potassium: 5.6 mmol/L — ABNORMAL HIGH (ref 3.5–5.1)
Sodium: 137 mmol/L (ref 135–145)

## 2023-03-18 LAB — PHOSPHORUS: Phosphorus: 2.4 mg/dL — ABNORMAL LOW (ref 2.5–4.6)

## 2023-03-20 LAB — TACROLIMUS LEVEL: Tacrolimus (FK506) - LabCorp: 5.5 ng/mL (ref 5.0–20.0)

## 2023-03-22 ENCOUNTER — Other Ambulatory Visit: Payer: Self-pay | Admitting: Neurology

## 2023-03-22 ENCOUNTER — Other Ambulatory Visit
Admission: RE | Admit: 2023-03-22 | Discharge: 2023-03-22 | Disposition: A | Discharge status: Home / Self Care | Attending: Nephrology | Admitting: Nephrology

## 2023-03-22 DIAGNOSIS — Z940 Kidney transplant status: Secondary | ICD-10-CM | POA: Diagnosis not present

## 2023-03-22 DIAGNOSIS — Z9483 Pancreas transplant status: Secondary | ICD-10-CM | POA: Diagnosis not present

## 2023-03-22 DIAGNOSIS — D899 Disorder involving the immune mechanism, unspecified: Secondary | ICD-10-CM | POA: Insufficient documentation

## 2023-03-22 DIAGNOSIS — B9689 Other specified bacterial agents as the cause of diseases classified elsewhere: Secondary | ICD-10-CM | POA: Diagnosis not present

## 2023-03-22 DIAGNOSIS — Z789 Other specified health status: Secondary | ICD-10-CM | POA: Insufficient documentation

## 2023-03-22 DIAGNOSIS — E1129 Type 2 diabetes mellitus with other diabetic kidney complication: Secondary | ICD-10-CM | POA: Insufficient documentation

## 2023-03-22 DIAGNOSIS — Z114 Encounter for screening for human immunodeficiency virus [HIV]: Secondary | ICD-10-CM | POA: Diagnosis not present

## 2023-03-22 DIAGNOSIS — N390 Urinary tract infection, site not specified: Secondary | ICD-10-CM | POA: Diagnosis not present

## 2023-03-22 DIAGNOSIS — B259 Cytomegaloviral disease, unspecified: Secondary | ICD-10-CM | POA: Diagnosis not present

## 2023-03-22 DIAGNOSIS — Z09 Encounter for follow-up examination after completed treatment for conditions other than malignant neoplasm: Secondary | ICD-10-CM | POA: Insufficient documentation

## 2023-03-22 DIAGNOSIS — N189 Chronic kidney disease, unspecified: Secondary | ICD-10-CM | POA: Insufficient documentation

## 2023-03-22 DIAGNOSIS — Z79899 Other long term (current) drug therapy: Secondary | ICD-10-CM | POA: Insufficient documentation

## 2023-03-22 DIAGNOSIS — E559 Vitamin D deficiency, unspecified: Secondary | ICD-10-CM | POA: Diagnosis not present

## 2023-03-22 DIAGNOSIS — D631 Anemia in chronic kidney disease: Secondary | ICD-10-CM | POA: Diagnosis not present

## 2023-03-22 LAB — CBC WITH DIFFERENTIAL/PLATELET
Abs Immature Granulocytes: 0.02 10*3/uL (ref 0.00–0.07)
Basophils Absolute: 0 10*3/uL (ref 0.0–0.1)
Basophils Relative: 0 %
Eosinophils Absolute: 0.1 10*3/uL (ref 0.0–0.5)
Eosinophils Relative: 2 %
HCT: 30.6 % — ABNORMAL LOW (ref 36.0–46.0)
Hemoglobin: 9.4 g/dL — ABNORMAL LOW (ref 12.0–15.0)
Immature Granulocytes: 0 %
Lymphocytes Relative: 12 %
Lymphs Abs: 0.5 10*3/uL — ABNORMAL LOW (ref 0.7–4.0)
MCH: 23.7 pg — ABNORMAL LOW (ref 26.0–34.0)
MCHC: 30.7 g/dL (ref 30.0–36.0)
MCV: 77.3 fL — ABNORMAL LOW (ref 80.0–100.0)
Monocytes Absolute: 0.2 10*3/uL (ref 0.1–1.0)
Monocytes Relative: 5 %
Neutro Abs: 3.6 10*3/uL (ref 1.7–7.7)
Neutrophils Relative %: 81 %
Platelets: 262 10*3/uL (ref 150–400)
RBC: 3.96 MIL/uL (ref 3.87–5.11)
RDW: 18.8 % — ABNORMAL HIGH (ref 11.5–15.5)
WBC: 4.5 10*3/uL (ref 4.0–10.5)
nRBC: 0 % (ref 0.0–0.2)

## 2023-03-22 LAB — BASIC METABOLIC PANEL
Anion gap: 8 (ref 5–15)
BUN: 44 mg/dL — ABNORMAL HIGH (ref 6–20)
CO2: 21 mmol/L — ABNORMAL LOW (ref 22–32)
Calcium: 9.3 mg/dL (ref 8.9–10.3)
Chloride: 111 mmol/L (ref 98–111)
Creatinine, Ser: 1.44 mg/dL — ABNORMAL HIGH (ref 0.44–1.00)
GFR, Estimated: 45 mL/min — ABNORMAL LOW (ref >60)
Glucose, Bld: 97 mg/dL (ref 70–99)
Potassium: 5.4 mmol/L — ABNORMAL HIGH (ref 3.5–5.1)
Sodium: 140 mmol/L (ref 135–145)

## 2023-03-22 LAB — PHOSPHORUS: Phosphorus: 2.2 mg/dL — ABNORMAL LOW (ref 2.5–4.6)

## 2023-03-22 LAB — MAGNESIUM: Magnesium: 1.4 mg/dL — ABNORMAL LOW (ref 1.7–2.4)

## 2023-03-23 LAB — TACROLIMUS LEVEL: Tacrolimus (FK506) - LabCorp: 5.9 ng/mL (ref 5.0–20.0)

## 2023-03-25 ENCOUNTER — Other Ambulatory Visit
Admission: RE | Admit: 2023-03-25 | Discharge: 2023-03-25 | Disposition: A | Discharge status: Home / Self Care | Attending: Nephrology | Admitting: Nephrology

## 2023-03-25 DIAGNOSIS — Z09 Encounter for follow-up examination after completed treatment for conditions other than malignant neoplasm: Secondary | ICD-10-CM | POA: Insufficient documentation

## 2023-03-25 DIAGNOSIS — N390 Urinary tract infection, site not specified: Secondary | ICD-10-CM | POA: Insufficient documentation

## 2023-03-25 DIAGNOSIS — Z9483 Pancreas transplant status: Secondary | ICD-10-CM | POA: Diagnosis not present

## 2023-03-25 DIAGNOSIS — Z940 Kidney transplant status: Secondary | ICD-10-CM | POA: Insufficient documentation

## 2023-03-25 DIAGNOSIS — Z789 Other specified health status: Secondary | ICD-10-CM | POA: Insufficient documentation

## 2023-03-25 DIAGNOSIS — Z79899 Other long term (current) drug therapy: Secondary | ICD-10-CM | POA: Diagnosis not present

## 2023-03-25 DIAGNOSIS — D899 Disorder involving the immune mechanism, unspecified: Secondary | ICD-10-CM | POA: Diagnosis not present

## 2023-03-25 DIAGNOSIS — D631 Anemia in chronic kidney disease: Secondary | ICD-10-CM | POA: Insufficient documentation

## 2023-03-25 DIAGNOSIS — E559 Vitamin D deficiency, unspecified: Secondary | ICD-10-CM | POA: Diagnosis not present

## 2023-03-25 DIAGNOSIS — B259 Cytomegaloviral disease, unspecified: Secondary | ICD-10-CM | POA: Insufficient documentation

## 2023-03-25 LAB — BASIC METABOLIC PANEL
Anion gap: 10 (ref 5–15)
BUN: 35 mg/dL — ABNORMAL HIGH (ref 6–20)
CO2: 20 mmol/L — ABNORMAL LOW (ref 22–32)
Calcium: 8.9 mg/dL (ref 8.9–10.3)
Chloride: 108 mmol/L (ref 98–111)
Creatinine, Ser: 1.63 mg/dL — ABNORMAL HIGH (ref 0.44–1.00)
GFR, Estimated: 38 mL/min — ABNORMAL LOW (ref >60)
Glucose, Bld: 115 mg/dL — ABNORMAL HIGH (ref 70–99)
Potassium: 5.2 mmol/L — ABNORMAL HIGH (ref 3.5–5.1)
Sodium: 138 mmol/L (ref 135–145)

## 2023-03-25 LAB — CBC WITH DIFFERENTIAL/PLATELET
Abs Immature Granulocytes: 0.03 10*3/uL (ref 0.00–0.07)
Basophils Absolute: 0 10*3/uL (ref 0.0–0.1)
Basophils Relative: 1 %
Eosinophils Absolute: 0.1 10*3/uL (ref 0.0–0.5)
Eosinophils Relative: 3 %
HCT: 31.9 % — ABNORMAL LOW (ref 36.0–46.0)
Hemoglobin: 9.7 g/dL — ABNORMAL LOW (ref 12.0–15.0)
Immature Granulocytes: 1 %
Lymphocytes Relative: 11 %
Lymphs Abs: 0.4 10*3/uL — ABNORMAL LOW (ref 0.7–4.0)
MCH: 24.6 pg — ABNORMAL LOW (ref 26.0–34.0)
MCHC: 30.4 g/dL (ref 30.0–36.0)
MCV: 80.8 fL (ref 80.0–100.0)
Monocytes Absolute: 0.2 10*3/uL (ref 0.1–1.0)
Monocytes Relative: 5 %
Neutro Abs: 3.1 10*3/uL (ref 1.7–7.7)
Neutrophils Relative %: 79 %
Platelets: 270 10*3/uL (ref 150–400)
RBC: 3.95 MIL/uL (ref 3.87–5.11)
RDW: 19.1 % — ABNORMAL HIGH (ref 11.5–15.5)
WBC: 3.9 10*3/uL — ABNORMAL LOW (ref 4.0–10.5)
nRBC: 0 % (ref 0.0–0.2)

## 2023-03-25 LAB — MAGNESIUM: Magnesium: 1.5 mg/dL — ABNORMAL LOW (ref 1.7–2.4)

## 2023-03-25 LAB — ALKALINE PHOSPHATASE: Alkaline Phosphatase: 111 U/L (ref 38–126)

## 2023-03-25 LAB — BILIRUBIN, TOTAL: Total Bilirubin: 0.3 mg/dL (ref 0.0–1.2)

## 2023-03-25 LAB — GAMMA GT: GGT: 34 U/L (ref 7–50)

## 2023-03-25 LAB — AST: AST: 18 U/L (ref 15–41)

## 2023-03-25 LAB — PHOSPHORUS: Phosphorus: 2.5 mg/dL (ref 2.5–4.6)

## 2023-03-25 LAB — ALBUMIN: Albumin: 4.2 g/dL (ref 3.5–5.0)

## 2023-03-25 LAB — ALT: ALT: 14 U/L (ref 0–44)

## 2023-03-26 LAB — HCV RNA QUANT: HCV Quantitative: NOT DETECTED [IU]/mL (ref >50)

## 2023-03-26 LAB — HCV RNA QUANT RFLX ULTRA OR GENOTYP
HCV RNA Qnt(log copy/mL): UNDETERMINED {Log_IU}/mL
HepC Qn: <15 [IU]/mL

## 2023-03-27 LAB — TACROLIMUS LEVEL: Tacrolimus (FK506) - LabCorp: 7.4 ng/mL (ref 5.0–20.0)

## 2023-03-30 ENCOUNTER — Ambulatory Visit: Payer: 59 | Admitting: Podiatry

## 2023-03-30 ENCOUNTER — Other Ambulatory Visit
Admission: RE | Admit: 2023-03-30 | Discharge: 2023-03-30 | Disposition: A | Discharge status: Home / Self Care | Source: Ambulatory Visit | Attending: Nephrology | Admitting: Nephrology

## 2023-03-30 DIAGNOSIS — Z940 Kidney transplant status: Secondary | ICD-10-CM | POA: Diagnosis not present

## 2023-03-30 DIAGNOSIS — E1129 Type 2 diabetes mellitus with other diabetic kidney complication: Secondary | ICD-10-CM | POA: Insufficient documentation

## 2023-03-30 DIAGNOSIS — B9689 Other specified bacterial agents as the cause of diseases classified elsewhere: Secondary | ICD-10-CM | POA: Insufficient documentation

## 2023-03-30 DIAGNOSIS — D899 Disorder involving the immune mechanism, unspecified: Secondary | ICD-10-CM | POA: Insufficient documentation

## 2023-03-30 DIAGNOSIS — N390 Urinary tract infection, site not specified: Secondary | ICD-10-CM | POA: Insufficient documentation

## 2023-03-30 DIAGNOSIS — D631 Anemia in chronic kidney disease: Secondary | ICD-10-CM | POA: Diagnosis not present

## 2023-03-30 DIAGNOSIS — Z114 Encounter for screening for human immunodeficiency virus [HIV]: Secondary | ICD-10-CM | POA: Insufficient documentation

## 2023-03-30 DIAGNOSIS — Z79899 Other long term (current) drug therapy: Secondary | ICD-10-CM | POA: Diagnosis not present

## 2023-03-30 DIAGNOSIS — Z9483 Pancreas transplant status: Secondary | ICD-10-CM | POA: Diagnosis not present

## 2023-03-30 DIAGNOSIS — Z789 Other specified health status: Secondary | ICD-10-CM | POA: Insufficient documentation

## 2023-03-30 DIAGNOSIS — N189 Chronic kidney disease, unspecified: Secondary | ICD-10-CM | POA: Diagnosis not present

## 2023-03-30 DIAGNOSIS — E559 Vitamin D deficiency, unspecified: Secondary | ICD-10-CM | POA: Insufficient documentation

## 2023-03-30 DIAGNOSIS — Z09 Encounter for follow-up examination after completed treatment for conditions other than malignant neoplasm: Secondary | ICD-10-CM | POA: Insufficient documentation

## 2023-03-30 LAB — CBC WITH DIFFERENTIAL/PLATELET
Abs Immature Granulocytes: 0.02 10*3/uL (ref 0.00–0.07)
Basophils Absolute: 0 10*3/uL (ref 0.0–0.1)
Basophils Relative: 1 %
Eosinophils Absolute: 0.1 10*3/uL (ref 0.0–0.5)
Eosinophils Relative: 2 %
HCT: 31.1 % — ABNORMAL LOW (ref 36.0–46.0)
Hemoglobin: 9.5 g/dL — ABNORMAL LOW (ref 12.0–15.0)
Immature Granulocytes: 1 %
Lymphocytes Relative: 13 %
Lymphs Abs: 0.5 10*3/uL — ABNORMAL LOW (ref 0.7–4.0)
MCH: 23.9 pg — ABNORMAL LOW (ref 26.0–34.0)
MCHC: 30.5 g/dL (ref 30.0–36.0)
MCV: 78.1 fL — ABNORMAL LOW (ref 80.0–100.0)
Monocytes Absolute: 0.2 10*3/uL (ref 0.1–1.0)
Monocytes Relative: 5 %
Neutro Abs: 2.9 10*3/uL (ref 1.7–7.7)
Neutrophils Relative %: 78 %
Platelets: 266 10*3/uL (ref 150–400)
RBC: 3.98 MIL/uL (ref 3.87–5.11)
RDW: 19 % — ABNORMAL HIGH (ref 11.5–15.5)
WBC: 3.7 10*3/uL — ABNORMAL LOW (ref 4.0–10.5)
nRBC: 0 % (ref 0.0–0.2)

## 2023-03-30 LAB — BASIC METABOLIC PANEL
Anion gap: 8 (ref 5–15)
BUN: 33 mg/dL — ABNORMAL HIGH (ref 6–20)
CO2: 24 mmol/L (ref 22–32)
Calcium: 9.6 mg/dL (ref 8.9–10.3)
Chloride: 107 mmol/L (ref 98–111)
Creatinine, Ser: 1.77 mg/dL — ABNORMAL HIGH (ref 0.44–1.00)
GFR, Estimated: 35 mL/min — ABNORMAL LOW (ref >60)
Glucose, Bld: 102 mg/dL — ABNORMAL HIGH (ref 70–99)
Potassium: 6.1 mmol/L — ABNORMAL HIGH (ref 3.5–5.1)
Sodium: 139 mmol/L (ref 135–145)

## 2023-03-30 LAB — AST: AST: 22 U/L (ref 15–41)

## 2023-03-30 LAB — MAGNESIUM: Magnesium: 1.6 mg/dL — ABNORMAL LOW (ref 1.7–2.4)

## 2023-03-30 LAB — PHOSPHORUS: Phosphorus: 2.2 mg/dL — ABNORMAL LOW (ref 2.5–4.6)

## 2023-03-30 LAB — ALKALINE PHOSPHATASE: Alkaline Phosphatase: 81 U/L (ref 38–126)

## 2023-03-30 LAB — BILIRUBIN, TOTAL: Total Bilirubin: 0.4 mg/dL (ref 0.0–1.2)

## 2023-03-30 LAB — ALBUMIN: Albumin: 3.7 g/dL (ref 3.5–5.0)

## 2023-03-30 LAB — ALT: ALT: 17 U/L (ref 0–44)

## 2023-03-30 LAB — GAMMA GT: GGT: 36 U/L (ref 7–50)

## 2023-03-31 LAB — TACROLIMUS LEVEL: Tacrolimus (FK506) - LabCorp: 9.1 ng/mL (ref 5.0–20.0)

## 2023-03-31 LAB — HCV RNA QUANT: HCV Quantitative: NOT DETECTED [IU]/mL (ref >50)

## 2023-04-02 ENCOUNTER — Other Ambulatory Visit
Admission: RE | Admit: 2023-04-02 | Discharge: 2023-04-02 | Disposition: A | Discharge status: Home / Self Care | Source: Ambulatory Visit | Attending: Nephrology | Admitting: Nephrology

## 2023-04-02 DIAGNOSIS — T8610 Unspecified complication of kidney transplant: Secondary | ICD-10-CM | POA: Diagnosis not present

## 2023-04-02 DIAGNOSIS — E1129 Type 2 diabetes mellitus with other diabetic kidney complication: Secondary | ICD-10-CM | POA: Diagnosis not present

## 2023-04-02 DIAGNOSIS — N390 Urinary tract infection, site not specified: Secondary | ICD-10-CM | POA: Diagnosis not present

## 2023-04-02 DIAGNOSIS — B259 Cytomegaloviral disease, unspecified: Secondary | ICD-10-CM | POA: Diagnosis not present

## 2023-04-02 DIAGNOSIS — D631 Anemia in chronic kidney disease: Secondary | ICD-10-CM | POA: Insufficient documentation

## 2023-04-02 DIAGNOSIS — Z09 Encounter for follow-up examination after completed treatment for conditions other than malignant neoplasm: Secondary | ICD-10-CM | POA: Insufficient documentation

## 2023-04-02 DIAGNOSIS — N189 Chronic kidney disease, unspecified: Secondary | ICD-10-CM | POA: Insufficient documentation

## 2023-04-02 DIAGNOSIS — Z789 Other specified health status: Secondary | ICD-10-CM | POA: Diagnosis not present

## 2023-04-02 DIAGNOSIS — E559 Vitamin D deficiency, unspecified: Secondary | ICD-10-CM | POA: Diagnosis not present

## 2023-04-02 DIAGNOSIS — D899 Disorder involving the immune mechanism, unspecified: Secondary | ICD-10-CM | POA: Diagnosis present

## 2023-04-02 DIAGNOSIS — Z114 Encounter for screening for human immunodeficiency virus [HIV]: Secondary | ICD-10-CM | POA: Insufficient documentation

## 2023-04-02 DIAGNOSIS — Z79899 Other long term (current) drug therapy: Secondary | ICD-10-CM | POA: Insufficient documentation

## 2023-04-02 DIAGNOSIS — Z9483 Pancreas transplant status: Secondary | ICD-10-CM | POA: Insufficient documentation

## 2023-04-02 LAB — CBC WITH DIFFERENTIAL/PLATELET
Abs Immature Granulocytes: 0.01 10*3/uL (ref 0.00–0.07)
Basophils Absolute: 0 10*3/uL (ref 0.0–0.1)
Basophils Relative: 1 %
Eosinophils Absolute: 0.1 10*3/uL (ref 0.0–0.5)
Eosinophils Relative: 2 %
HCT: 30 % — ABNORMAL LOW (ref 36.0–46.0)
Hemoglobin: 9.1 g/dL — ABNORMAL LOW (ref 12.0–15.0)
Immature Granulocytes: 0 %
Lymphocytes Relative: 11 %
Lymphs Abs: 0.4 10*3/uL — ABNORMAL LOW (ref 0.7–4.0)
MCH: 24 pg — ABNORMAL LOW (ref 26.0–34.0)
MCHC: 30.3 g/dL (ref 30.0–36.0)
MCV: 79.2 fL — ABNORMAL LOW (ref 80.0–100.0)
Monocytes Absolute: 0.2 10*3/uL (ref 0.1–1.0)
Monocytes Relative: 5 %
Neutro Abs: 2.9 10*3/uL (ref 1.7–7.7)
Neutrophils Relative %: 81 %
Platelets: 222 10*3/uL (ref 150–400)
RBC: 3.79 MIL/uL — ABNORMAL LOW (ref 3.87–5.11)
RDW: 18.5 % — ABNORMAL HIGH (ref 11.5–15.5)
WBC: 3.6 10*3/uL — ABNORMAL LOW (ref 4.0–10.5)
nRBC: 0 % (ref 0.0–0.2)

## 2023-04-02 LAB — BASIC METABOLIC PANEL WITH GFR
Anion gap: 8 (ref 5–15)
BUN: 40 mg/dL — ABNORMAL HIGH (ref 6–20)
CO2: 25 mmol/L (ref 22–32)
Calcium: 8.9 mg/dL (ref 8.9–10.3)
Chloride: 106 mmol/L (ref 98–111)
Creatinine, Ser: 1.95 mg/dL — ABNORMAL HIGH (ref 0.44–1.00)
GFR, Estimated: 31 mL/min — ABNORMAL LOW (ref >60)
Glucose, Bld: 81 mg/dL (ref 70–99)
Potassium: 5.3 mmol/L — ABNORMAL HIGH (ref 3.5–5.1)
Sodium: 139 mmol/L (ref 135–145)

## 2023-04-02 LAB — PHOSPHORUS: Phosphorus: 2.8 mg/dL (ref 2.5–4.6)

## 2023-04-02 LAB — MAGNESIUM: Magnesium: 1.5 mg/dL — ABNORMAL LOW (ref 1.7–2.4)

## 2023-04-03 LAB — TACROLIMUS LEVEL: Tacrolimus (FK506) - LabCorp: 9.2 ng/mL (ref 5.0–20.0)

## 2023-04-06 ENCOUNTER — Other Ambulatory Visit
Admission: RE | Admit: 2023-04-06 | Discharge: 2023-04-06 | Disposition: A | Discharge status: Home / Self Care | Attending: Nephrology | Admitting: Nephrology

## 2023-04-06 ENCOUNTER — Ambulatory Visit: Admitting: Podiatry

## 2023-04-06 DIAGNOSIS — E559 Vitamin D deficiency, unspecified: Secondary | ICD-10-CM | POA: Insufficient documentation

## 2023-04-06 DIAGNOSIS — D899 Disorder involving the immune mechanism, unspecified: Secondary | ICD-10-CM | POA: Insufficient documentation

## 2023-04-06 DIAGNOSIS — N390 Urinary tract infection, site not specified: Secondary | ICD-10-CM | POA: Insufficient documentation

## 2023-04-06 DIAGNOSIS — B259 Cytomegaloviral disease, unspecified: Secondary | ICD-10-CM | POA: Insufficient documentation

## 2023-04-06 DIAGNOSIS — E1129 Type 2 diabetes mellitus with other diabetic kidney complication: Secondary | ICD-10-CM | POA: Insufficient documentation

## 2023-04-06 DIAGNOSIS — Z09 Encounter for follow-up examination after completed treatment for conditions other than malignant neoplasm: Secondary | ICD-10-CM | POA: Diagnosis not present

## 2023-04-06 DIAGNOSIS — Z9483 Pancreas transplant status: Secondary | ICD-10-CM | POA: Diagnosis not present

## 2023-04-06 DIAGNOSIS — N189 Chronic kidney disease, unspecified: Secondary | ICD-10-CM | POA: Diagnosis not present

## 2023-04-06 DIAGNOSIS — Z940 Kidney transplant status: Secondary | ICD-10-CM | POA: Diagnosis not present

## 2023-04-06 DIAGNOSIS — Z114 Encounter for screening for human immunodeficiency virus [HIV]: Secondary | ICD-10-CM | POA: Insufficient documentation

## 2023-04-06 DIAGNOSIS — D631 Anemia in chronic kidney disease: Secondary | ICD-10-CM | POA: Insufficient documentation

## 2023-04-06 DIAGNOSIS — Z79899 Other long term (current) drug therapy: Secondary | ICD-10-CM | POA: Insufficient documentation

## 2023-04-06 DIAGNOSIS — Z789 Other specified health status: Secondary | ICD-10-CM | POA: Insufficient documentation

## 2023-04-06 LAB — CBC WITH DIFFERENTIAL/PLATELET
Abs Immature Granulocytes: 0.06 K/uL (ref 0.00–0.07)
Basophils Absolute: 0 K/uL (ref 0.0–0.1)
Basophils Relative: 1 %
Eosinophils Absolute: 0.1 K/uL (ref 0.0–0.5)
Eosinophils Relative: 4 %
HCT: 29.4 % — ABNORMAL LOW (ref 36.0–46.0)
Hemoglobin: 9.2 g/dL — ABNORMAL LOW (ref 12.0–15.0)
Immature Granulocytes: 2 %
Lymphocytes Relative: 14 %
Lymphs Abs: 0.3 K/uL — ABNORMAL LOW (ref 0.7–4.0)
MCH: 23.9 pg — ABNORMAL LOW (ref 26.0–34.0)
MCHC: 31.3 g/dL (ref 30.0–36.0)
MCV: 76.4 fL — ABNORMAL LOW (ref 80.0–100.0)
Monocytes Absolute: 0.3 K/uL (ref 0.1–1.0)
Monocytes Relative: 11 %
Neutro Abs: 1.7 K/uL (ref 1.7–7.7)
Neutrophils Relative %: 68 %
Platelets: 275 K/uL (ref 150–400)
RBC: 3.85 MIL/uL — ABNORMAL LOW (ref 3.87–5.11)
RDW: 18.1 % — ABNORMAL HIGH (ref 11.5–15.5)
WBC: 2.5 K/uL — ABNORMAL LOW (ref 4.0–10.5)
nRBC: 0 % (ref 0.0–0.2)

## 2023-04-06 LAB — PHOSPHORUS: Phosphorus: 2.7 mg/dL (ref 2.5–4.6)

## 2023-04-06 LAB — BASIC METABOLIC PANEL WITH GFR
Anion gap: 8 (ref 5–15)
BUN: 33 mg/dL — ABNORMAL HIGH (ref 6–20)
CO2: 25 mmol/L (ref 22–32)
Calcium: 8.7 mg/dL — ABNORMAL LOW (ref 8.9–10.3)
Chloride: 103 mmol/L (ref 98–111)
Creatinine, Ser: 1.62 mg/dL — ABNORMAL HIGH (ref 0.44–1.00)
GFR, Estimated: 39 mL/min — ABNORMAL LOW (ref >60)
Glucose, Bld: 106 mg/dL — ABNORMAL HIGH (ref 70–99)
Potassium: 5.3 mmol/L — ABNORMAL HIGH (ref 3.5–5.1)
Sodium: 136 mmol/L (ref 135–145)

## 2023-04-06 LAB — BILIRUBIN, TOTAL: Total Bilirubin: 0.6 mg/dL (ref 0.0–1.2)

## 2023-04-06 LAB — GAMMA GT: GGT: 35 U/L (ref 7–50)

## 2023-04-06 LAB — AST: AST: 21 U/L (ref 15–41)

## 2023-04-06 LAB — ALKALINE PHOSPHATASE: Alkaline Phosphatase: 86 U/L (ref 38–126)

## 2023-04-06 LAB — ALBUMIN: Albumin: 3.8 g/dL (ref 3.5–5.0)

## 2023-04-06 LAB — MAGNESIUM: Magnesium: 1.2 mg/dL — ABNORMAL LOW (ref 1.7–2.4)

## 2023-04-06 LAB — ALT: ALT: 11 U/L (ref 0–44)

## 2023-04-07 LAB — HCV RNA QUANT
HCV Quantitative Log: 1.477 {Log_IU}/mL — ABNORMAL LOW (ref >1.70)
HCV Quantitative: 30 [IU]/mL — ABNORMAL LOW (ref >50)

## 2023-04-07 LAB — HCV RNA QUANT RFLX ULTRA OR GENOTYP
HCV RNA Qnt(log copy/mL): UNDETERMINED {Log_IU}/mL
HepC Qn: NOT DETECTED [IU]/mL

## 2023-04-07 LAB — TACROLIMUS LEVEL: Tacrolimus (FK506) - LabCorp: 6.1 ng/mL (ref 5.0–20.0)

## 2023-04-13 ENCOUNTER — Other Ambulatory Visit
Admission: RE | Admit: 2023-04-13 | Discharge: 2023-04-13 | Disposition: A | Discharge status: Home / Self Care | Attending: Nephrology | Admitting: Nephrology

## 2023-04-13 DIAGNOSIS — B259 Cytomegaloviral disease, unspecified: Secondary | ICD-10-CM | POA: Insufficient documentation

## 2023-04-13 DIAGNOSIS — D631 Anemia in chronic kidney disease: Secondary | ICD-10-CM | POA: Diagnosis not present

## 2023-04-13 DIAGNOSIS — Z79899 Other long term (current) drug therapy: Secondary | ICD-10-CM | POA: Insufficient documentation

## 2023-04-13 DIAGNOSIS — D899 Disorder involving the immune mechanism, unspecified: Secondary | ICD-10-CM | POA: Insufficient documentation

## 2023-04-13 DIAGNOSIS — N390 Urinary tract infection, site not specified: Secondary | ICD-10-CM | POA: Insufficient documentation

## 2023-04-13 DIAGNOSIS — Z9483 Pancreas transplant status: Secondary | ICD-10-CM | POA: Diagnosis not present

## 2023-04-13 DIAGNOSIS — Z789 Other specified health status: Secondary | ICD-10-CM | POA: Diagnosis not present

## 2023-04-13 DIAGNOSIS — E559 Vitamin D deficiency, unspecified: Secondary | ICD-10-CM | POA: Insufficient documentation

## 2023-04-13 DIAGNOSIS — Z114 Encounter for screening for human immunodeficiency virus [HIV]: Secondary | ICD-10-CM | POA: Insufficient documentation

## 2023-04-13 DIAGNOSIS — Z940 Kidney transplant status: Secondary | ICD-10-CM | POA: Insufficient documentation

## 2023-04-13 DIAGNOSIS — E1129 Type 2 diabetes mellitus with other diabetic kidney complication: Secondary | ICD-10-CM | POA: Insufficient documentation

## 2023-04-13 DIAGNOSIS — Z09 Encounter for follow-up examination after completed treatment for conditions other than malignant neoplasm: Secondary | ICD-10-CM | POA: Insufficient documentation

## 2023-04-13 LAB — CBC WITH DIFFERENTIAL/PLATELET
Abs Immature Granulocytes: 0.19 10*3/uL — ABNORMAL HIGH (ref 0.00–0.07)
Basophils Absolute: 0 10*3/uL (ref 0.0–0.1)
Basophils Relative: 1 %
Eosinophils Absolute: 0.1 10*3/uL (ref 0.0–0.5)
Eosinophils Relative: 2 %
HCT: 30.8 % — ABNORMAL LOW (ref 36.0–46.0)
Hemoglobin: 9.6 g/dL — ABNORMAL LOW (ref 12.0–15.0)
Immature Granulocytes: 5 %
Lymphocytes Relative: 15 %
Lymphs Abs: 0.6 10*3/uL — ABNORMAL LOW (ref 0.7–4.0)
MCH: 23.7 pg — ABNORMAL LOW (ref 26.0–34.0)
MCHC: 31.2 g/dL (ref 30.0–36.0)
MCV: 76 fL — ABNORMAL LOW (ref 80.0–100.0)
Monocytes Absolute: 0.5 10*3/uL (ref 0.1–1.0)
Monocytes Relative: 11 %
Neutro Abs: 2.6 10*3/uL (ref 1.7–7.7)
Neutrophils Relative %: 66 %
Platelets: 337 10*3/uL (ref 150–400)
RBC: 4.05 MIL/uL (ref 3.87–5.11)
RDW: 17.2 % — ABNORMAL HIGH (ref 11.5–15.5)
WBC: 4 10*3/uL (ref 4.0–10.5)
nRBC: 0 % (ref 0.0–0.2)

## 2023-04-13 LAB — BASIC METABOLIC PANEL WITH GFR
Anion gap: 10 (ref 5–15)
BUN: 37 mg/dL — ABNORMAL HIGH (ref 6–20)
CO2: 22 mmol/L (ref 22–32)
Calcium: 9.5 mg/dL (ref 8.9–10.3)
Chloride: 100 mmol/L (ref 98–111)
Creatinine, Ser: 2.06 mg/dL — ABNORMAL HIGH (ref 0.44–1.00)
GFR, Estimated: 29 mL/min — ABNORMAL LOW (ref >60)
Glucose, Bld: 79 mg/dL (ref 70–99)
Potassium: 4.5 mmol/L (ref 3.5–5.1)
Sodium: 132 mmol/L — ABNORMAL LOW (ref 135–145)

## 2023-04-13 LAB — PHOSPHORUS: Phosphorus: 2.8 mg/dL (ref 2.5–4.6)

## 2023-04-13 LAB — MAGNESIUM: Magnesium: 1.5 mg/dL — ABNORMAL LOW (ref 1.7–2.4)

## 2023-04-13 LAB — ALT: ALT: 13 U/L (ref 0–44)

## 2023-04-13 LAB — ALKALINE PHOSPHATASE: Alkaline Phosphatase: 91 U/L (ref 38–126)

## 2023-04-13 LAB — ALBUMIN: Albumin: 4 g/dL (ref 3.5–5.0)

## 2023-04-13 LAB — AST: AST: 20 U/L (ref 15–41)

## 2023-04-13 LAB — BILIRUBIN, TOTAL: Total Bilirubin: 0.5 mg/dL (ref 0.0–1.2)

## 2023-04-13 LAB — GAMMA GT: GGT: 46 U/L (ref 7–50)

## 2023-04-14 LAB — HCV RNA QUANT RFLX ULTRA OR GENOTYP
HCV RNA Qnt(log copy/mL): UNDETERMINED {Log_IU}/mL
HepC Qn: NOT DETECTED [IU]/mL

## 2023-04-14 LAB — TACROLIMUS LEVEL: Tacrolimus (FK506) - LabCorp: 8.5 ng/mL (ref 5.0–20.0)

## 2023-04-14 LAB — HCV RNA QUANT: HCV Quantitative: NOT DETECTED [IU]/mL (ref >50)

## 2023-04-15 ENCOUNTER — Other Ambulatory Visit
Admission: RE | Admit: 2023-04-15 | Discharge: 2023-04-15 | Disposition: A | Discharge status: Home / Self Care | Attending: Nephrology | Admitting: Nephrology

## 2023-04-15 DIAGNOSIS — N390 Urinary tract infection, site not specified: Secondary | ICD-10-CM | POA: Insufficient documentation

## 2023-04-15 DIAGNOSIS — Z09 Encounter for follow-up examination after completed treatment for conditions other than malignant neoplasm: Secondary | ICD-10-CM | POA: Diagnosis not present

## 2023-04-15 DIAGNOSIS — B259 Cytomegaloviral disease, unspecified: Secondary | ICD-10-CM | POA: Insufficient documentation

## 2023-04-15 DIAGNOSIS — Z940 Kidney transplant status: Secondary | ICD-10-CM | POA: Insufficient documentation

## 2023-04-15 DIAGNOSIS — E1129 Type 2 diabetes mellitus with other diabetic kidney complication: Secondary | ICD-10-CM | POA: Insufficient documentation

## 2023-04-15 DIAGNOSIS — E559 Vitamin D deficiency, unspecified: Secondary | ICD-10-CM | POA: Insufficient documentation

## 2023-04-15 DIAGNOSIS — Z79899 Other long term (current) drug therapy: Secondary | ICD-10-CM | POA: Diagnosis not present

## 2023-04-15 DIAGNOSIS — T8610 Unspecified complication of kidney transplant: Secondary | ICD-10-CM | POA: Diagnosis not present

## 2023-04-15 DIAGNOSIS — Z789 Other specified health status: Secondary | ICD-10-CM | POA: Diagnosis not present

## 2023-04-15 DIAGNOSIS — D899 Disorder involving the immune mechanism, unspecified: Secondary | ICD-10-CM | POA: Insufficient documentation

## 2023-04-15 DIAGNOSIS — I10 Essential (primary) hypertension: Secondary | ICD-10-CM | POA: Diagnosis not present

## 2023-04-15 DIAGNOSIS — Z114 Encounter for screening for human immunodeficiency virus [HIV]: Secondary | ICD-10-CM | POA: Insufficient documentation

## 2023-04-15 DIAGNOSIS — I471 Supraventricular tachycardia, unspecified: Secondary | ICD-10-CM | POA: Diagnosis not present

## 2023-04-15 DIAGNOSIS — Z9483 Pancreas transplant status: Secondary | ICD-10-CM | POA: Diagnosis not present

## 2023-04-15 DIAGNOSIS — E7800 Pure hypercholesterolemia, unspecified: Secondary | ICD-10-CM | POA: Diagnosis not present

## 2023-04-17 LAB — URINALYSIS, ROUTINE W REFLEX MICROSCOPIC
Bilirubin Urine: NEGATIVE
Glucose, UA: NEGATIVE mg/dL
Hgb urine dipstick: NEGATIVE
Ketones, ur: NEGATIVE mg/dL
Leukocytes,Ua: NEGATIVE
Nitrite: NEGATIVE
Protein, ur: NEGATIVE mg/dL
Specific Gravity, Urine: 1.01 (ref 1.005–1.030)
pH: 5 (ref 5.0–8.0)

## 2023-04-17 LAB — URINE CULTURE: Culture: <10000 — AB

## 2023-04-17 LAB — PROTEIN / CREATININE RATIO, URINE
Creatinine, Urine: 62 mg/dL
Total Protein, Urine: <6 mg/dL

## 2023-04-19 LAB — MICROALBUMIN / CREATININE URINE RATIO
Creatinine, Urine: 55 mg/dL
Microalb Creat Ratio: 52 mg/g{creat} — ABNORMAL HIGH (ref 0–29)
Microalb, Ur: 28.5 ug/mL — ABNORMAL HIGH

## 2023-04-20 ENCOUNTER — Other Ambulatory Visit
Admission: RE | Admit: 2023-04-20 | Discharge: 2023-04-20 | Disposition: A | Discharge status: Home / Self Care | Attending: Nephrology | Admitting: Nephrology

## 2023-04-20 DIAGNOSIS — Z79899 Other long term (current) drug therapy: Secondary | ICD-10-CM | POA: Diagnosis not present

## 2023-04-20 DIAGNOSIS — L0291 Cutaneous abscess, unspecified: Secondary | ICD-10-CM | POA: Diagnosis not present

## 2023-04-20 DIAGNOSIS — D631 Anemia in chronic kidney disease: Secondary | ICD-10-CM | POA: Diagnosis not present

## 2023-04-20 DIAGNOSIS — E1129 Type 2 diabetes mellitus with other diabetic kidney complication: Secondary | ICD-10-CM | POA: Diagnosis not present

## 2023-04-20 DIAGNOSIS — Z940 Kidney transplant status: Secondary | ICD-10-CM | POA: Diagnosis not present

## 2023-04-20 DIAGNOSIS — B259 Cytomegaloviral disease, unspecified: Secondary | ICD-10-CM | POA: Diagnosis not present

## 2023-04-20 DIAGNOSIS — Z9483 Pancreas transplant status: Secondary | ICD-10-CM | POA: Insufficient documentation

## 2023-04-20 DIAGNOSIS — B9689 Other specified bacterial agents as the cause of diseases classified elsewhere: Secondary | ICD-10-CM | POA: Diagnosis not present

## 2023-04-20 DIAGNOSIS — N189 Chronic kidney disease, unspecified: Secondary | ICD-10-CM | POA: Insufficient documentation

## 2023-04-20 DIAGNOSIS — N390 Urinary tract infection, site not specified: Secondary | ICD-10-CM | POA: Insufficient documentation

## 2023-04-20 DIAGNOSIS — E559 Vitamin D deficiency, unspecified: Secondary | ICD-10-CM | POA: Insufficient documentation

## 2023-04-20 DIAGNOSIS — Z789 Other specified health status: Secondary | ICD-10-CM | POA: Diagnosis not present

## 2023-04-20 DIAGNOSIS — D899 Disorder involving the immune mechanism, unspecified: Secondary | ICD-10-CM | POA: Diagnosis not present

## 2023-04-20 DIAGNOSIS — Z114 Encounter for screening for human immunodeficiency virus [HIV]: Secondary | ICD-10-CM | POA: Diagnosis not present

## 2023-04-20 DIAGNOSIS — L03111 Cellulitis of right axilla: Secondary | ICD-10-CM | POA: Diagnosis not present

## 2023-04-20 DIAGNOSIS — L02411 Cutaneous abscess of right axilla: Secondary | ICD-10-CM | POA: Diagnosis not present

## 2023-04-20 DIAGNOSIS — Z09 Encounter for follow-up examination after completed treatment for conditions other than malignant neoplasm: Secondary | ICD-10-CM | POA: Insufficient documentation

## 2023-04-20 LAB — URINALYSIS, ROUTINE W REFLEX MICROSCOPIC
Glucose, UA: NEGATIVE mg/dL
Hgb urine dipstick: NEGATIVE
Ketones, ur: NEGATIVE mg/dL
Leukocytes,Ua: NEGATIVE
Nitrite: NEGATIVE
Protein, ur: 30 mg/dL — AB
Specific Gravity, Urine: 1.019 (ref 1.005–1.030)
pH: 5 (ref 5.0–8.0)

## 2023-04-20 LAB — CBC WITH DIFFERENTIAL/PLATELET
Abs Immature Granulocytes: 0.04 10*3/uL (ref 0.00–0.07)
Basophils Absolute: 0 10*3/uL (ref 0.0–0.1)
Basophils Relative: 1 %
Eosinophils Absolute: 0.1 10*3/uL (ref 0.0–0.5)
Eosinophils Relative: 1 %
HCT: 34.7 % — ABNORMAL LOW (ref 36.0–46.0)
Hemoglobin: 10.6 g/dL — ABNORMAL LOW (ref 12.0–15.0)
Immature Granulocytes: 1 %
Lymphocytes Relative: 10 %
Lymphs Abs: 0.6 10*3/uL — ABNORMAL LOW (ref 0.7–4.0)
MCH: 23.6 pg — ABNORMAL LOW (ref 26.0–34.0)
MCHC: 30.5 g/dL (ref 30.0–36.0)
MCV: 77.3 fL — ABNORMAL LOW (ref 80.0–100.0)
Monocytes Absolute: 0.3 10*3/uL (ref 0.1–1.0)
Monocytes Relative: 5 %
Neutro Abs: 4.8 10*3/uL (ref 1.7–7.7)
Neutrophils Relative %: 82 %
Platelets: 363 10*3/uL (ref 150–400)
RBC: 4.49 MIL/uL (ref 3.87–5.11)
RDW: 17.2 % — ABNORMAL HIGH (ref 11.5–15.5)
WBC: 5.8 10*3/uL (ref 4.0–10.5)
nRBC: 0 % (ref 0.0–0.2)

## 2023-04-20 LAB — BASIC METABOLIC PANEL WITH GFR
Anion gap: 8 (ref 5–15)
BUN: 40 mg/dL — ABNORMAL HIGH (ref 6–20)
CO2: 21 mmol/L — ABNORMAL LOW (ref 22–32)
Calcium: 9 mg/dL (ref 8.9–10.3)
Chloride: 105 mmol/L (ref 98–111)
Creatinine, Ser: 2.09 mg/dL — ABNORMAL HIGH (ref 0.44–1.00)
GFR, Estimated: 29 mL/min — ABNORMAL LOW (ref >60)
Glucose, Bld: 91 mg/dL (ref 70–99)
Potassium: 5.1 mmol/L (ref 3.5–5.1)
Sodium: 134 mmol/L — ABNORMAL LOW (ref 135–145)

## 2023-04-20 LAB — MAGNESIUM: Magnesium: 1.4 mg/dL — ABNORMAL LOW (ref 1.7–2.4)

## 2023-04-20 LAB — PHOSPHORUS: Phosphorus: 3 mg/dL (ref 2.5–4.6)

## 2023-04-20 LAB — PROTEIN / CREATININE RATIO, URINE
Creatinine, Urine: 171 mg/dL
Protein Creatinine Ratio: 0.2 mg/mg{creat} — ABNORMAL HIGH (ref 0.00–0.15)
Total Protein, Urine: 34 mg/dL

## 2023-04-21 LAB — URINE CULTURE: Culture: <10000 — AB

## 2023-04-21 LAB — MICROALBUMIN / CREATININE URINE RATIO
Creatinine, Urine: 152.4 mg/dL
Microalb Creat Ratio: 114 mg/g{creat} — ABNORMAL HIGH (ref 0–29)
Microalb, Ur: 173.9 ug/mL — ABNORMAL HIGH

## 2023-04-21 LAB — TACROLIMUS LEVEL: Tacrolimus (FK506) - LabCorp: 8.8 ng/mL (ref 5.0–20.0)

## 2023-04-22 DIAGNOSIS — L02411 Cutaneous abscess of right axilla: Secondary | ICD-10-CM | POA: Diagnosis not present

## 2023-04-22 DIAGNOSIS — Z4800 Encounter for change or removal of nonsurgical wound dressing: Secondary | ICD-10-CM | POA: Diagnosis not present

## 2023-04-23 DIAGNOSIS — G63 Polyneuropathy in diseases classified elsewhere: Secondary | ICD-10-CM | POA: Diagnosis not present

## 2023-04-23 DIAGNOSIS — N186 End stage renal disease: Secondary | ICD-10-CM | POA: Diagnosis not present

## 2023-04-23 DIAGNOSIS — F5101 Primary insomnia: Secondary | ICD-10-CM | POA: Diagnosis not present

## 2023-04-27 ENCOUNTER — Other Ambulatory Visit
Admission: RE | Admit: 2023-04-27 | Discharge: 2023-04-27 | Disposition: A | Discharge status: Home / Self Care | Attending: Nephrology | Admitting: Nephrology

## 2023-04-27 DIAGNOSIS — Z114 Encounter for screening for human immunodeficiency virus [HIV]: Secondary | ICD-10-CM | POA: Insufficient documentation

## 2023-04-27 DIAGNOSIS — E1129 Type 2 diabetes mellitus with other diabetic kidney complication: Secondary | ICD-10-CM | POA: Insufficient documentation

## 2023-04-27 DIAGNOSIS — D631 Anemia in chronic kidney disease: Secondary | ICD-10-CM | POA: Insufficient documentation

## 2023-04-27 DIAGNOSIS — Z789 Other specified health status: Secondary | ICD-10-CM | POA: Diagnosis not present

## 2023-04-27 DIAGNOSIS — Z9483 Pancreas transplant status: Secondary | ICD-10-CM | POA: Diagnosis not present

## 2023-04-27 DIAGNOSIS — R600 Localized edema: Secondary | ICD-10-CM | POA: Diagnosis not present

## 2023-04-27 DIAGNOSIS — M62551 Muscle wasting and atrophy, not elsewhere classified, right thigh: Secondary | ICD-10-CM | POA: Diagnosis not present

## 2023-04-27 DIAGNOSIS — B390 Acute pulmonary histoplasmosis capsulati: Secondary | ICD-10-CM | POA: Insufficient documentation

## 2023-04-27 DIAGNOSIS — Z09 Encounter for follow-up examination after completed treatment for conditions other than malignant neoplasm: Secondary | ICD-10-CM | POA: Diagnosis not present

## 2023-04-27 DIAGNOSIS — E559 Vitamin D deficiency, unspecified: Secondary | ICD-10-CM | POA: Insufficient documentation

## 2023-04-27 DIAGNOSIS — D899 Disorder involving the immune mechanism, unspecified: Secondary | ICD-10-CM | POA: Diagnosis not present

## 2023-04-27 DIAGNOSIS — Z940 Kidney transplant status: Secondary | ICD-10-CM | POA: Insufficient documentation

## 2023-04-27 DIAGNOSIS — Z79899 Other long term (current) drug therapy: Secondary | ICD-10-CM | POA: Insufficient documentation

## 2023-04-27 DIAGNOSIS — R4182 Altered mental status, unspecified: Secondary | ICD-10-CM | POA: Insufficient documentation

## 2023-04-27 DIAGNOSIS — M76891 Other specified enthesopathies of right lower limb, excluding foot: Secondary | ICD-10-CM | POA: Diagnosis not present

## 2023-04-27 DIAGNOSIS — B259 Cytomegaloviral disease, unspecified: Secondary | ICD-10-CM | POA: Insufficient documentation

## 2023-04-27 DIAGNOSIS — T8610 Unspecified complication of kidney transplant: Secondary | ICD-10-CM | POA: Insufficient documentation

## 2023-04-27 LAB — URINALYSIS, COMPLETE (UACMP) WITH MICROSCOPIC
Glucose, UA: NEGATIVE mg/dL
Hgb urine dipstick: NEGATIVE
Ketones, ur: NEGATIVE mg/dL
Leukocytes,Ua: NEGATIVE
Nitrite: NEGATIVE
Protein, ur: 100 mg/dL — AB
Specific Gravity, Urine: 1.029 (ref 1.005–1.030)
pH: 5 (ref 5.0–8.0)

## 2023-04-27 LAB — PROTEIN / CREATININE RATIO, URINE
Creatinine, Urine: 351 mg/dL
Protein Creatinine Ratio: 0.15 mg/mg{creat} (ref 0.00–0.15)
Total Protein, Urine: 52 mg/dL

## 2023-04-27 LAB — CBC WITH DIFFERENTIAL/PLATELET
Abs Immature Granulocytes: 0.02 10*3/uL (ref 0.00–0.07)
Basophils Absolute: 0 10*3/uL (ref 0.0–0.1)
Basophils Relative: 1 %
Eosinophils Absolute: 0.1 10*3/uL (ref 0.0–0.5)
Eosinophils Relative: 2 %
HCT: 34.1 % — ABNORMAL LOW (ref 36.0–46.0)
Hemoglobin: 10.7 g/dL — ABNORMAL LOW (ref 12.0–15.0)
Immature Granulocytes: 1 %
Lymphocytes Relative: 20 %
Lymphs Abs: 0.7 10*3/uL (ref 0.7–4.0)
MCH: 23.9 pg — ABNORMAL LOW (ref 26.0–34.0)
MCHC: 31.4 g/dL (ref 30.0–36.0)
MCV: 76.3 fL — ABNORMAL LOW (ref 80.0–100.0)
Monocytes Absolute: 0.4 10*3/uL (ref 0.1–1.0)
Monocytes Relative: 11 %
Neutro Abs: 2.4 10*3/uL (ref 1.7–7.7)
Neutrophils Relative %: 65 %
Platelets: 327 10*3/uL (ref 150–400)
RBC: 4.47 MIL/uL (ref 3.87–5.11)
RDW: 17.5 % — ABNORMAL HIGH (ref 11.5–15.5)
WBC: 3.6 10*3/uL — ABNORMAL LOW (ref 4.0–10.5)
nRBC: 0 % (ref 0.0–0.2)

## 2023-04-27 LAB — PHOSPHORUS: Phosphorus: 2.7 mg/dL (ref 2.5–4.6)

## 2023-04-27 LAB — BASIC METABOLIC PANEL WITH GFR
Anion gap: 9 (ref 5–15)
BUN: 43 mg/dL — ABNORMAL HIGH (ref 6–20)
CO2: 20 mmol/L — ABNORMAL LOW (ref 22–32)
Calcium: 9.8 mg/dL (ref 8.9–10.3)
Chloride: 108 mmol/L (ref 98–111)
Creatinine, Ser: 2.05 mg/dL — ABNORMAL HIGH (ref 0.44–1.00)
GFR, Estimated: 29 mL/min — ABNORMAL LOW (ref >60)
Glucose, Bld: 114 mg/dL — ABNORMAL HIGH (ref 70–99)
Potassium: 5.7 mmol/L — ABNORMAL HIGH (ref 3.5–5.1)
Sodium: 137 mmol/L (ref 135–145)

## 2023-04-27 LAB — MAGNESIUM: Magnesium: 1.4 mg/dL — ABNORMAL LOW (ref 1.7–2.4)

## 2023-04-28 ENCOUNTER — Encounter: Payer: Self-pay | Admitting: Podiatry

## 2023-04-28 ENCOUNTER — Ambulatory Visit (INDEPENDENT_AMBULATORY_CARE_PROVIDER_SITE_OTHER): Admitting: Podiatry

## 2023-04-28 VITALS — Ht 67.0 in | Wt 245.0 lb

## 2023-04-28 DIAGNOSIS — M216X1 Other acquired deformities of right foot: Secondary | ICD-10-CM | POA: Diagnosis not present

## 2023-04-28 DIAGNOSIS — M79674 Pain in right toe(s): Secondary | ICD-10-CM | POA: Diagnosis not present

## 2023-04-28 DIAGNOSIS — M79675 Pain in left toe(s): Secondary | ICD-10-CM | POA: Diagnosis not present

## 2023-04-28 DIAGNOSIS — B351 Tinea unguium: Secondary | ICD-10-CM | POA: Diagnosis not present

## 2023-04-28 DIAGNOSIS — G629 Polyneuropathy, unspecified: Secondary | ICD-10-CM

## 2023-04-28 LAB — URINE CULTURE: Culture: <10000 — AB

## 2023-04-28 LAB — MICROALBUMIN / CREATININE URINE RATIO
Creatinine, Urine: 319.4 mg/dL
Microalb Creat Ratio: 66 mg/g{creat} — ABNORMAL HIGH (ref 0–29)
Microalb, Ur: 209.4 ug/mL — ABNORMAL HIGH

## 2023-04-28 NOTE — Progress Notes (Signed)
 Chief Complaint  Patient presents with   Callouses    Patient is here for bilateral foot callous F/U    HPI: 48 y.o. female PMHx ESRD on hemodialysis presenting today for follow-up evaluation of ulcers to the plantar aspect of the bilateral feet.  Patient has neuropathy and cannot feel her feet.  She is doing very well.  No new complaints  Past Medical History:  Diagnosis Date   Acid reflux    Anemia 04/2019   REQUIRING BLOOD TRANSFUSIONS   Anxiety    Arthritis    CHF (congestive heart failure) (HCC)    Dyspnea    with exertion   Dysrhythmia    tachycardia   ESRD (end stage renal disease) (HCC)    [facility]   Facial twitching 11/2020   mouth twitch, eyes roll back, neurology started patient on low dose   Sinemet  IR.   GI bleeding 12/2017   Gout    Headache(784.0)    "related to chemo; sometimes weekly" (09/12/2013)   Heart murmur    Systolic per Dr Pasqual Bone' notes   High cholesterol    History of blood transfusion "a couple"   "related to low counts"   Hypertension    Iron  deficiency anemia    "get epogen shots q month" (02/20/2014)   MCGN (minimal change glomerulonephritis)    "using chemo to tx;  finished my last tx in 12/2013"   Neuropathy    Peritoneal dialysis status (HCC)    Stroke (HCC) 08/15/2017   ruled out that it was not a stroke    Past Surgical History:  Procedure Laterality Date   ABDOMINAL HYSTERECTOMY  2010   "laparoscopic"   ANKLE FRACTURE SURGERY Right 1994   AV FISTULA PLACEMENT Left 04/14/2016   Procedure: ARTERIOVENOUS (AV) FISTULA CREATION;  Surgeon: Dannis Dy, MD;  Location: [facility] OR;  Service: Vascular;  Laterality: Left;   AV FISTULA PLACEMENT Left 08/09/2017   Procedure: INSERTION OF ARTERIOVENOUS (AV) GORE-TEX GRAFT LEFT ARM;  Surgeon: Richrd Char, MD;  [facility] OR;  Service: Vascular;  Laterality: Left;   AV FISTULA PLACEMENT Right 03/15/2020   Procedure: RIGHT ARM ARTERIOVENOUS (AV) FISTULA CREATION 1st  Stage Basilic Transposition;  Surgeon: Margherita Shell, MD;  [facility] OR;  Service: Vascular;  Laterality: Right;   AV FISTULA PLACEMENT Right 02/24/2021   Procedure: RIGHT ARM ARTERIOVENOUS  FISTULA CREATION;  Surgeon: Kayla Part, MD;  Location: [facility] OR;  Service: Vascular;  Laterality: Right;  regional block   AVGG REMOVAL Left 08/11/2017   Procedure: REMOVAL OF ARTERIOVENOUS GORETEX GRAFT (AVGG);  Surgeon: Margherita Shell, MD;  Location: [facility] OR;  Service: Vascular;  Laterality: Left;   BASCILIC VEIN TRANSPOSITION Right 05/17/2020   Procedure: RIGHT UPPER EXTREMITY SECOND STAGE BASCILIC VEIN TRANSPOSITION;  Surgeon: Margherita Shell, MD;  [facility];  Service: Vascular;  Laterality: Right;   BIOPSY  09/03/2017   Procedure: BIOPSY;  Surgeon: Felecia Hopper, MD;  [facility] ENDOSCOPY;  Service: Gastroenterology;;   BIOPSY  12/24/2017   Procedure: BIOPSY;  Surgeon: Normie Becton., MD;  Location: [facility] ENDOSCOPY;  Service: Gastroenterology;;   CARDIAC CATHETERIZATION  2000's   COLONOSCOPY N/A 10/22/2022   Procedure: COLONOSCOPY;  Surgeon: Shane Darling, MD;  Location: [facility] ENDOSCOPY;  Service: Endoscopy;  Laterality: N/A;   COLONOSCOPY WITH PROPOFOL  N/A 09/04/2017   Procedure: COLONOSCOPY WITH PROPOFOL ;  Surgeon: Genell Ken, MD;  Location: [facility] ENDOSCOPY;  Service: Gastroenterology;  Laterality: N/A;  ENTEROSCOPY N/A 12/24/2017   Procedure: ENTEROSCOPY;  Surgeon: Brice Campi Albino Alu., MD;  Location: [facility] ENDOSCOPY;  Service: Gastroenterology;  Laterality: N/A;   ESOPHAGOGASTRODUODENOSCOPY     ESOPHAGOGASTRODUODENOSCOPY (EGD) WITH PROPOFOL  Left 06/04/2017   Procedure: ESOPHAGOGASTRODUODENOSCOPY (EGD) WITH PROPOFOL ;  Surgeon: Evangeline Hilts, MD;  Location: [facility] ENDOSCOPY;  Service: Endoscopy;  Laterality: Left;   ESOPHAGOGASTRODUODENOSCOPY (EGD) WITH PROPOFOL  N/A 09/03/2017   Procedure: ESOPHAGOGASTRODUODENOSCOPY (EGD) WITH PROPOFOL ;  Surgeon: Felecia Hopper, MD;   [facility] ENDOSCOPY;  Service: Gastroenterology;  Laterality: N/A;   ESOPHAGOGASTRODUODENOSCOPY (EGD) WITH PROPOFOL  N/A 04/27/2022   Procedure: ESOPHAGOGASTRODUODENOSCOPY (EGD) WITH PROPOFOL ;  Surgeon: Shane Darling, MD;  Location: [facility] ENDOSCOPY;  Service: Endoscopy;  Laterality: N/A;   FISTULA SUPERFICIALIZATION Left 04/02/2017   Procedure: FISTULA SUPERFICIALIZATION LEFT ARM;  Surgeon: Margherita Shell, MD;  Location: [facility] OR;  Service: Vascular;  Laterality: Left;   FISTULA SUPERFICIALIZATION Right 04/14/2021   Procedure: RIGHT ARM FISTULA SUPERFICIALIZATION;  Surgeon: Kayla Part, MD;  Location: [facility] OR;  Service: Vascular;  Laterality: Right;  PERIPHERAL NERVE BLOCK   FISTULOGRAM Left 04/07/2016   Procedure: FISTULOGRAM;  Surgeon: Adine Hoof, MD;  Location: [facility] OR;  Service: Vascular;  Laterality: Left;   FRACTURE SURGERY     right ankle   GIVENS CAPSULE STUDY N/A 10/29/2017   Procedure: GIVENS CAPSULE STUDY;  Surgeon: Baldo Bonds, MD;  Location: [facility] ENDOSCOPY;  Service: Endoscopy;  Laterality: N/A;   GIVENS CAPSULE STUDY N/A 12/24/2017   Procedure: GIVENS CAPSULE STUDY;  Surgeon: Normie Becton., MD;  Location: [facility] ENDOSCOPY;  Service: Gastroenterology;  Laterality: N/A;   GIVENS CAPSULE STUDY N/A 05/22/2018   Procedure: GIVENS CAPSULE STUDY;  Surgeon: Normie Becton., MD;  Location: [facility] ENDOSCOPY;  Service: Gastroenterology;  Laterality: N/A;   INSERTION OF DIALYSIS CATHETER Right 04/07/2016   Procedure: INSERTION OF DIALYSIS CATHETER;  Surgeon: Adine Hoof, MD;  Location: [facility] OR;  Service: Vascular;  Laterality: Right;   INSERTION OF DIALYSIS CATHETER Right 04/04/2017   Procedure: INSERTION OF DIALYSIS CATHETER;  Surgeon: Adine Hoof, MD;  Location: [facility] OR;  Service: Vascular;  Laterality: Right;   IR FLUORO GUIDE CV LINE LEFT  03/13/2020   IR US  GUIDE VASC ACCESS LEFT  03/13/2020   LEFT HEART CATH AND CORONARY ANGIOGRAPHY N/A  05/12/2019   Procedure: LEFT HEART CATH AND CORONARY ANGIOGRAPHY;  Surgeon: Pasqual Bone, MD;  [facility] INVASIVE CV LAB;  Service: Cardiovascular;  Laterality: N/A;   LIGATION OF ARTERIOVENOUS  FISTULA Left 04/14/2016   Procedure: LIGATION OF ARTERIOVENOUS  FISTULA;  Surgeon: Dannis Dy, MD;  Location: [facility] OR;  Service: Vascular;  Laterality: Left;   LIGATION OF COMPETING BRANCHES OF ARTERIOVENOUS FISTULA Right 05/17/2020   Procedure: LIGATION OF COMPETING BRANCHES OF RIGHT ARM ARTERIOVENOUS FISTULA;  Surgeon: Margherita Shell, MD;  Location[facility] OR;  Service: Vascular;  Laterality: Right;   PATCH ANGIOPLASTY Left 06/19/2016   Procedure: PATCH ANGIOPLASTY LEFT ARTERIOVENOUS FISTULA;  Surgeon: Dannis Dy, MD;  Location: [facility] OR;  Service: Vascular;  Laterality: Left;   POLYPECTOMY  09/04/2017   Procedure: POLYPECTOMY;  Surgeon: Genell Ken, MD;  Location: [facility] ENDOSCOPY;  Service: Gastroenterology;;   REVISON OF ARTERIOVENOUS FISTULA Left 06/19/2016   Procedure: REVISION SUPERFICIALIZATION OF BRACHIOCEPHALIC ARTERIOVENOUS FISTULA;  Surgeon: Dannis Dy, MD;  Location: [facility] OR;  Service: Vascular;  Laterality: Left;   UPPER EXTREMITY VENOGRAPHY Bilateral 02/19/2021   Procedure: UPPER EXTREMITY VENOGRAPHY;  Surgeon: Kayla Part, MD;  Location: [facility]  INVASIVE CV LAB;  Service: Cardiovascular;  Laterality: Bilateral;    Allergies  Allergen Reactions   Nsaids Other (See Comments)    Cannot take due to Kidney disease/Kidney function  Other Reaction(s): Unknown   Tolmetin Other (See Comments)    Cannot take due to Kidney Disease    B/L feet 04/14/2022  Physical Exam: General: The patient is alert and oriented x3 in no acute distress.  Dermatology: Callus lesions noted to the weightbearing surfaces of the bilateral feet.  No open wounds.  After debridement today there is an ulcer to the plantar aspect of the left forefoot during approximately 0.6 from 0.6 from 0.2 cm.   Granular wound base.  No exposed bone.  Good potential for healing  Vascular: Palpable pedal pulses bilaterally. Capillary refill within normal limits.  Negative for any significant edema or erythema  Neurological: Light touch and protective threshold absent  Musculoskeletal Exam: No pedal deformities noted.  No prior amputations.   Assessment: 1.  Progressive callus lesions bilateral feet  2.  Preulcerative callus right plantar fifth MTP 3.  Recent kidney transplant recipient.  January 2025 4.  Plantarflexed metatarsals bilateral 5.  Chronic severe peripheral diabetic polyneuropathy 6.  Pain due to onychomycosis of toenails both  Plan of Care:  -Patient evaluated.  -Mechanical debridement of nails 1-5 bilateral performed using a nail nipper without incident or bleeding -Continue custom molded accommodative insoles - Continue wearing good supportive shoes and slippers even around the house -Excisional debridement of the hyperkeratotic preulcerative callus lesion to the right foot was also performed today with 312 scalpel without incident or bleeding - The patient is tried multiple medications to address her painful neuropathy to bilateral feet.  Currently on gabapentin  which helps minimally.  She has painful neuropathy on a daily basis.  Been ongoing for several years.  I do believe she would be a good candidate for [employer] -Authorization request for [employer] application -Return to clinic 3 months routine footcare  Dot Gazella, DPM [facility]  Dr. Dot Gazella, DPM    [address] [place], [place] [address]                Office [phone]  Fax [phone]

## 2023-04-29 LAB — TACROLIMUS LEVEL: Tacrolimus (FK506) - LabCorp: 12.5 ng/mL (ref 5.0–20.0)

## 2023-05-03 ENCOUNTER — Emergency Department

## 2023-05-03 ENCOUNTER — Other Ambulatory Visit: Payer: Self-pay

## 2023-05-03 ENCOUNTER — Emergency Department
Admission: EM | Admit: 2023-05-03 | Discharge: 2023-05-05 | Disposition: A | Discharge status: Home / Self Care | Attending: Emergency Medicine | Admitting: Emergency Medicine

## 2023-05-03 DIAGNOSIS — R079 Chest pain, unspecified: Secondary | ICD-10-CM | POA: Diagnosis not present

## 2023-05-03 DIAGNOSIS — Z940 Kidney transplant status: Secondary | ICD-10-CM | POA: Insufficient documentation

## 2023-05-03 DIAGNOSIS — R0789 Other chest pain: Secondary | ICD-10-CM | POA: Diagnosis not present

## 2023-05-03 DIAGNOSIS — I509 Heart failure, unspecified: Secondary | ICD-10-CM | POA: Diagnosis not present

## 2023-05-03 DIAGNOSIS — G4733 Obstructive sleep apnea (adult) (pediatric): Secondary | ICD-10-CM | POA: Diagnosis not present

## 2023-05-03 DIAGNOSIS — N179 Acute kidney failure, unspecified: Secondary | ICD-10-CM | POA: Insufficient documentation

## 2023-05-03 DIAGNOSIS — I110 Hypertensive heart disease with heart failure: Secondary | ICD-10-CM | POA: Diagnosis not present

## 2023-05-03 DIAGNOSIS — R0602 Shortness of breath: Secondary | ICD-10-CM | POA: Diagnosis not present

## 2023-05-03 LAB — URINALYSIS, W/ REFLEX TO CULTURE (INFECTION SUSPECTED)
Glucose, UA: NEGATIVE mg/dL
Hgb urine dipstick: NEGATIVE
Ketones, ur: NEGATIVE mg/dL
Leukocytes,Ua: NEGATIVE
Nitrite: NEGATIVE
Protein, ur: NEGATIVE mg/dL
Specific Gravity, Urine: 1.018 (ref 1.005–1.030)
pH: 5 (ref 5.0–8.0)

## 2023-05-03 LAB — CBC
HCT: 35.3 % — ABNORMAL LOW (ref 36.0–46.0)
Hemoglobin: 10.4 g/dL — ABNORMAL LOW (ref 12.0–15.0)
MCH: 23.6 pg — ABNORMAL LOW (ref 26.0–34.0)
MCHC: 29.5 g/dL — ABNORMAL LOW (ref 30.0–36.0)
MCV: 80 fL (ref 80.0–100.0)
Platelets: 235 10*3/uL (ref 150–400)
RBC: 4.41 MIL/uL (ref 3.87–5.11)
RDW: 17.9 % — ABNORMAL HIGH (ref 11.5–15.5)
WBC: 3.8 10*3/uL — ABNORMAL LOW (ref 4.0–10.5)
nRBC: 0 % (ref 0.0–0.2)

## 2023-05-03 LAB — BASIC METABOLIC PANEL WITH GFR
Anion gap: 10 (ref 5–15)
BUN: 49 mg/dL — ABNORMAL HIGH (ref 6–20)
CO2: 16 mmol/L — ABNORMAL LOW (ref 22–32)
Calcium: 9.7 mg/dL (ref 8.9–10.3)
Chloride: 106 mmol/L (ref 98–111)
Creatinine, Ser: 2.33 mg/dL — ABNORMAL HIGH (ref 0.44–1.00)
GFR, Estimated: 25 mL/min — ABNORMAL LOW (ref >60)
Glucose, Bld: 102 mg/dL — ABNORMAL HIGH (ref 70–99)
Potassium: 5 mmol/L (ref 3.5–5.1)
Sodium: 132 mmol/L — ABNORMAL LOW (ref 135–145)

## 2023-05-03 LAB — HEPATIC FUNCTION PANEL
ALT: 6 U/L (ref 0–44)
AST: 16 U/L (ref 15–41)
Albumin: 3.7 g/dL (ref 3.5–5.0)
Alkaline Phosphatase: 102 U/L (ref 38–126)
Bilirubin, Direct: <0.1 mg/dL (ref 0.0–0.2)
Total Bilirubin: 0.6 mg/dL (ref 0.0–1.2)
Total Protein: 6.6 g/dL (ref 6.5–8.1)

## 2023-05-03 LAB — TROPONIN I (HIGH SENSITIVITY)
Troponin I (High Sensitivity): 4 ng/L (ref <18)
Troponin I (High Sensitivity): 4 ng/L (ref <18)

## 2023-05-03 LAB — LIPASE, BLOOD: Lipase: 27 U/L (ref 11–51)

## 2023-05-03 MED ORDER — MYCOPHENOLATE MOFETIL 250 MG PO CAPS
750.0000 mg | ORAL_CAPSULE | Freq: Two times a day (BID) | ORAL | Status: DC
  Administered 2023-05-03 – 2023-05-05 (×4): 750 mg via ORAL
  Filled 2023-05-03 (×4): qty 3

## 2023-05-03 MED ORDER — PANTOPRAZOLE SODIUM 40 MG IV SOLR
40.0000 mg | Freq: Once | INTRAVENOUS | Status: AC
  Administered 2023-05-03: 40 mg via INTRAVENOUS
  Filled 2023-05-03: qty 10

## 2023-05-03 MED ORDER — VALGANCICLOVIR HCL 450 MG PO TABS
450.0000 mg | ORAL_TABLET | Freq: Every day | ORAL | Status: DC
Start: 2023-05-03 — End: 2023-05-04
  Filled 2023-05-03 (×2): qty 1

## 2023-05-03 MED ORDER — ALPRAZOLAM 0.5 MG PO TABS
1.0000 mg | ORAL_TABLET | Freq: Three times a day (TID) | ORAL | Status: DC | PRN
  Administered 2023-05-04 (×2): 1 mg via ORAL
  Filled 2023-05-03 (×2): qty 2

## 2023-05-03 MED ORDER — PANTOPRAZOLE SODIUM 40 MG PO TBEC
40.0000 mg | DELAYED_RELEASE_TABLET | Freq: Two times a day (BID) | ORAL | Status: DC
Start: 2023-05-03 — End: 2023-05-04

## 2023-05-03 MED ORDER — GABAPENTIN 100 MG PO CAPS
200.0000 mg | ORAL_CAPSULE | Freq: Once | ORAL | Status: AC
  Administered 2023-05-04: 200 mg via ORAL
  Filled 2023-05-03: qty 2

## 2023-05-03 MED ORDER — GABAPENTIN 100 MG PO CAPS
100.0000 mg | ORAL_CAPSULE | Freq: Every day | ORAL | Status: DC
  Administered 2023-05-03: 100 mg via ORAL
  Filled 2023-05-03: qty 1

## 2023-05-03 MED ORDER — CARVEDILOL 25 MG PO TABS
25.0000 mg | ORAL_TABLET | Freq: Two times a day (BID) | ORAL | Status: DC
  Administered 2023-05-04 – 2023-05-05 (×3): 25 mg via ORAL
  Filled 2023-05-03 (×3): qty 1

## 2023-05-03 MED ORDER — CARBIDOPA-LEVODOPA 25-100 MG PO TABS
1.0000 | ORAL_TABLET | Freq: Three times a day (TID) | ORAL | Status: DC
  Administered 2023-05-03 – 2023-05-05 (×5): 1 via ORAL
  Filled 2023-05-03 (×6): qty 1

## 2023-05-03 MED ORDER — GABAPENTIN 300 MG PO CAPS: 300.0000 mg | ORAL_CAPSULE | Freq: Every day | ORAL | Status: DC

## 2023-05-03 MED ORDER — FERROUS SULFATE 325 (65 FE) MG PO TABS
325.0000 mg | ORAL_TABLET | Freq: Two times a day (BID) | ORAL | Status: DC
  Administered 2023-05-04 – 2023-05-05 (×4): 325 mg via ORAL
  Filled 2023-05-03 (×4): qty 1

## 2023-05-03 MED ORDER — ROPINIROLE HCL 1 MG PO TABS
1.0000 mg | ORAL_TABLET | Freq: Every day | ORAL | Status: DC
  Administered 2023-05-04 (×2): 1 mg via ORAL
  Filled 2023-05-03 (×2): qty 1

## 2023-05-03 MED ORDER — FENTANYL CITRATE PF 50 MCG/ML IJ SOSY
50.0000 ug | PREFILLED_SYRINGE | INTRAMUSCULAR | Status: DC | PRN
  Administered 2023-05-04 (×4): 50 ug via INTRAVENOUS
  Filled 2023-05-03 (×4): qty 1

## 2023-05-03 MED ORDER — ACETAMINOPHEN 500 MG PO TABS
1000.0000 mg | ORAL_TABLET | Freq: Four times a day (QID) | ORAL | Status: DC | PRN
  Administered 2023-05-05: 1000 mg via ORAL
  Filled 2023-05-03: qty 2

## 2023-05-03 MED ORDER — DULOXETINE HCL 60 MG PO CPEP
60.0000 mg | ORAL_CAPSULE | Freq: Every day | ORAL | Status: DC
  Administered 2023-05-03 – 2023-05-05 (×3): 60 mg via ORAL
  Filled 2023-05-03 (×3): qty 1

## 2023-05-03 MED ORDER — FENTANYL CITRATE PF 50 MCG/ML IJ SOSY
50.0000 ug | PREFILLED_SYRINGE | Freq: Once | INTRAMUSCULAR | Status: AC
  Administered 2023-05-03: 50 ug via INTRAVENOUS
  Filled 2023-05-03: qty 1

## 2023-05-03 MED ORDER — ALLOPURINOL 100 MG PO TABS
100.0000 mg | ORAL_TABLET | Freq: Every day | ORAL | Status: DC
  Administered 2023-05-05: 100 mg via ORAL
  Filled 2023-05-03 (×2): qty 1

## 2023-05-03 MED ORDER — CHLORTHALIDONE 25 MG PO TABS
12.5000 mg | ORAL_TABLET | Freq: Every day | ORAL | Status: DC
  Administered 2023-05-04 – 2023-05-05 (×2): 12.5 mg via ORAL
  Filled 2023-05-03 (×2): qty 0.5

## 2023-05-03 MED ORDER — TACROLIMUS 1 MG PO CAPS
4.0000 mg | ORAL_CAPSULE | Freq: Two times a day (BID) | ORAL | Status: DC
  Administered 2023-05-03 – 2023-05-05 (×4): 4 mg via ORAL
  Filled 2023-05-03 (×4): qty 4

## 2023-05-03 MED ORDER — SODIUM CHLORIDE 0.9 % IV BOLUS
1000.0000 mL | Freq: Once | INTRAVENOUS | Status: AC
  Administered 2023-05-03: 1000 mL via INTRAVENOUS

## 2023-05-03 MED ORDER — GABAPENTIN 100 MG PO CAPS: 100.0000 mg | ORAL_CAPSULE | Freq: Two times a day (BID) | ORAL | Status: DC

## 2023-05-03 NOTE — ED Notes (Signed)
 Called to [facility] per MD Mumma @826PM Eugenia Hess [employer] Faxed/Rep Leith

## 2023-05-03 NOTE — ED Provider Notes (Signed)
 [facility] Provider Note    Event Date/Time   First MD Initiated Contact with Patient 05/03/23 1948     (approximate)   History   Chest Pain   HPI  Patricia [name] is a 48 y.o. female past medical history significant for renal transplant, presents to the emergency department with chest pain.  Patient states that she developed chest pain to her left shoulder and chest.  Described as a sharp pain.  Feels similar to prior episodes of chest pain that she had while in the hospital at [facility].  Denies any shortness of breath.  No pain worse with deep inspiration.  Denies cough.  Denies abdominal pain at this time.  Does complain of some mild back pain and right sided pain.  To review patient currently follows with [facility] transplant team and received a renal transplant 01/31/2023.  Patient has been having uptrending creatinine over the past 10 days.     Physical Exam   Triage Vital Signs: ED Triage Vitals  Encounter Vitals Group     BP 05/03/23 1844 114/76     Systolic BP Percentile --      Diastolic BP Percentile --      Pulse Rate 05/03/23 1844 71     Resp 05/03/23 1844 18     Temp 05/03/23 1844 98.1 F (36.7 C)     Temp src --      SpO2 05/03/23 1844 100 %     Weight 05/03/23 1843 230 lb (104.3 kg)     Height 05/03/23 1843 5\' 7"  (1.702 m)     Head Circumference --      Peak Flow --      Pain Score 05/03/23 1843 8     Pain Loc --      Pain Education --      Exclude from Growth Chart --     Most recent vital signs: Vitals:   05/03/23 2200 05/03/23 2228  BP: (!) 150/83   Pulse: 64   Resp:    Temp:  98 F (36.7 C)  SpO2: 100%     Physical Exam Constitutional:      Appearance: She is well-developed.  HENT:     Head: Atraumatic.  Eyes:     Conjunctiva/sclera: Conjunctivae normal.  Cardiovascular:     Rate and Rhythm: Regular rhythm.     Heart sounds: Normal heart sounds.  Pulmonary:     Effort: No respiratory distress.  Abdominal:      General: There is no distension.     Palpations: Abdomen is soft.     Comments: Mild right-sided tenderness to palpation and mild CVA tenderness to palpation bilaterally.  No rebound or guarding.  Negative Murphy sign.  Musculoskeletal:        General: Normal range of motion.     Cervical back: Normal range of motion.     Right lower leg: No edema.     Left lower leg: No edema.  Skin:    General: Skin is warm.     Capillary Refill: Capillary refill takes less than 2 seconds.  Neurological:     General: No focal deficit present.     Mental Status: She is alert. Mental status is at baseline.     IMPRESSION / MDM / ASSESSMENT AND PLAN / ED COURSE  I reviewed the triage vital signs and the nursing notes.  Differential diagnosis including ACS, referred pain from her transplanted kidney, gastritis/PUD, pneumonia, pericarditis  Lower  suspicion for pulmonary embolism, no low risk Wells criteria, no shortness of breath and 100% on room air, no pleuritic nature of her chest pain.  No tearing chest pain and pulses are equal and symmetric of the low suspicion for dissection.   EKG  I, Viviano Ground, the attending physician, personally viewed and interpreted this ECG.   Rate: Normal  Rhythm: Normal sinus  Axis: Normal  Intervals: Normal  ST&T Change: None No significant change when compared to prior EKG  No tachycardic or bradycardic dysrhythmias while on cardiac telemetry.  RADIOLOGY I independently reviewed imaging, my interpretation of imaging: Chest x-ray no signs of pneumonia  LABS (all labs ordered are listed, but only abnormal results are displayed) Labs interpreted as -    Labs Reviewed  BASIC METABOLIC PANEL WITH GFR - Abnormal; Notable for the following components:      Result Value   Sodium 132 (*)    CO2 16 (*)    Glucose, Bld 102 (*)    BUN 49 (*)    Creatinine, Ser 2.33 (*)    GFR, Estimated 25 (*)    All other components within normal limits  CBC -  Abnormal; Notable for the following components:   WBC 3.8 (*)    Hemoglobin 10.4 (*)    HCT 35.3 (*)    MCH 23.6 (*)    MCHC 29.5 (*)    RDW 17.9 (*)    All other components within normal limits  URINALYSIS, W/ REFLEX TO CULTURE (INFECTION SUSPECTED) - Abnormal; Notable for the following components:   Color, Urine YELLOW (*)    APPearance CLEAR (*)    Bilirubin Urine SMALL (*)    Bacteria, UA RARE (*)    All other components within normal limits  HEPATIC FUNCTION PANEL  LIPASE, BLOOD  TACROLIMUS  LEVEL  TROPONIN I (HIGH SENSITIVITY)  TROPONIN I (HIGH SENSITIVITY)     MDM   Clinical Course as of 05/03/23 2337  Mon May 03, 2023  2029 [facility] transplant team given recent renal transplant with back pain and pain to her right abdomen.  Creatinine appears to be close to her baseline but does have a CO2 that is low at 16 with a normal anion gap. [SM]  2244 Reordered home medications for nighttime doses.  Waiting on medication will rec to reorder all home medications. [SM]    Clinical Course User Index [SM] Viviano Ground, MD   When discussing with the team at [facility] stated that she had a recent hospitalization for chest pain and they felt that it was secondary to gastritis/PUD from steroid use.  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  Had recommended her transfer over to [facility] for further evaluation and placed her on the waiting list.  Patient's home medications were reordered.  Give another dose of IV fentanyl  for pain control.  Care transferred to incoming provider.  PROCEDURES:  Critical Care performed: No  Procedures  Patient's presentation is most consistent with acute presentation with potential threat to life or bodily function.   MEDICATIONS ORDERED IN ED: Medications  mycophenolate (CELLCEPT) capsule 750 mg (750 mg Oral Given 05/03/23 2308)  tacrolimus  (PROGRAF ) capsule 4 mg (4 mg Oral Given 05/03/23 2308)   carbidopa -levodopa  (SINEMET  IR) 25-100 MG per tablet immediate release 1 tablet (1 tablet Oral Given 05/03/23 2308)  DULoxetine  (CYMBALTA ) DR capsule 60 mg (60 mg Oral Given 05/03/23 2309)  valGANciclovir (VALCYTE) 450 MG tablet TABS  450 mg (450 mg Oral Not Given 05/03/23 2308)  carvedilol  (COREG ) tablet 25 mg (has no administration in time range)  ferrous sulfate  tablet 325 mg (has no administration in time range)  pantoprazole  (PROTONIX ) EC tablet 40 mg (has no administration in time range)  rOPINIRole  (REQUIP ) tablet 1 mg (has no administration in time range)  allopurinol  (ZYLOPRIM ) tablet 100 mg (has no administration in time range)  ALPRAZolam  (XANAX ) tablet 1 mg (has no administration in time range)  chlorthalidone (HYGROTON) tablet 12.5 mg (has no administration in time range)  fentaNYL  (SUBLIMAZE ) injection 50 mcg (has no administration in time range)  acetaminophen  (TYLENOL ) tablet 1,000 mg (has no administration in time range)  gabapentin  (NEURONTIN ) capsule 100 mg (has no administration in time range)    And  gabapentin  (NEURONTIN ) capsule 300 mg (has no administration in time range)  gabapentin  (NEURONTIN ) capsule 200 mg (has no administration in time range)  sodium chloride  0.9 % bolus 1,000 mL (1,000 mLs Intravenous New Bag/Given 05/03/23 2227)  fentaNYL  (SUBLIMAZE ) injection 50 mcg (50 mcg Intravenous Given 05/03/23 2217)  pantoprazole  (PROTONIX ) injection 40 mg (40 mg Intravenous Given 05/03/23 2223)    FINAL CLINICAL IMPRESSION(S) / ED DIAGNOSES   Final diagnoses:  Chest pain, unspecified type  AKI (acute kidney injury) (HCC)  Renal transplant, status post     Rx / DC Orders   ED Discharge Orders     None        Note:  This document was prepared using Dragon voice recognition software and may include unintentional dictation errors.   Viviano Ground, MD 05/03/23 2337

## 2023-05-03 NOTE — ED Triage Notes (Signed)
 Pt comes with cp that started earlier today. Pt states she tried to ignore it but then it moved into her left shoulder blade. Pt recent kidney transplant in Jan. Pt states sob with exertion.

## 2023-05-04 DIAGNOSIS — G4733 Obstructive sleep apnea (adult) (pediatric): Secondary | ICD-10-CM | POA: Diagnosis not present

## 2023-05-04 MED ORDER — HYDROXYZINE HCL 25 MG PO TABS
25.0000 mg | ORAL_TABLET | Freq: Four times a day (QID) | ORAL | Status: DC | PRN
  Administered 2023-05-04: 25 mg via ORAL
  Filled 2023-05-04: qty 1

## 2023-05-04 MED ORDER — PANTOPRAZOLE SODIUM 40 MG PO TBEC
40.0000 mg | DELAYED_RELEASE_TABLET | Freq: Two times a day (BID) | ORAL | Status: DC
  Administered 2023-05-04 – 2023-05-05 (×3): 40 mg via ORAL
  Filled 2023-05-04 (×3): qty 1

## 2023-05-04 MED ORDER — GABAPENTIN 300 MG PO CAPS
300.0000 mg | ORAL_CAPSULE | Freq: Every day | ORAL | Status: DC
  Administered 2023-05-04: 300 mg via ORAL
  Filled 2023-05-04: qty 1

## 2023-05-04 MED ORDER — GABAPENTIN 100 MG PO CAPS
100.0000 mg | ORAL_CAPSULE | Freq: Two times a day (BID) | ORAL | Status: DC
  Administered 2023-05-04 – 2023-05-05 (×3): 100 mg via ORAL
  Filled 2023-05-04 (×3): qty 1

## 2023-05-04 MED ORDER — ZOLPIDEM TARTRATE 5 MG PO TABS
5.0000 mg | ORAL_TABLET | Freq: Every evening | ORAL | Status: DC | PRN
  Administered 2023-05-04: 5 mg via ORAL
  Filled 2023-05-04 (×2): qty 1

## 2023-05-04 MED ORDER — HYDROXYZINE HCL 25 MG PO TABS
25.0000 mg | ORAL_TABLET | Freq: Once | ORAL | Status: AC
  Administered 2023-05-04: 25 mg via ORAL
  Filled 2023-05-04: qty 1

## 2023-05-04 NOTE — ED Notes (Signed)
 Called to [facility] @ 5:30 for update on a bed ,they are still at capacity / no bed available at this time. Spoke with Athena Bland

## 2023-05-04 NOTE — ED Notes (Signed)
 Called to [facility] for pt update on acceptance to waitlist @651am  / still no bed at this time per Rep Arcadio Knuckles.

## 2023-05-04 NOTE — ED Notes (Addendum)
 Updates given to [facility], no beds available at this time per transfer center. Pt notified

## 2023-05-04 NOTE — ED Notes (Signed)
Pt requesting anxiety medication

## 2023-05-04 NOTE — ED Provider Notes (Signed)
-----------------------------------------   6:08 AM on 05/04/2023 -----------------------------------------   No events overnight night.  Patient remains in the emergency department pending bed availability at [facility].   Norlene Beavers, MD 05/04/23 [phone]

## 2023-05-05 ENCOUNTER — Other Ambulatory Visit: Payer: Self-pay

## 2023-05-05 LAB — BASIC METABOLIC PANEL WITH GFR
Anion gap: 7 (ref 5–15)
BUN: 34 mg/dL — ABNORMAL HIGH (ref 6–20)
CO2: 18 mmol/L — ABNORMAL LOW (ref 22–32)
Calcium: 9.4 mg/dL (ref 8.9–10.3)
Chloride: 112 mmol/L — ABNORMAL HIGH (ref 98–111)
Creatinine, Ser: 1.53 mg/dL — ABNORMAL HIGH (ref 0.44–1.00)
GFR, Estimated: 41 mL/min — ABNORMAL LOW (ref >60)
Glucose, Bld: 102 mg/dL — ABNORMAL HIGH (ref 70–99)
Potassium: 5.7 mmol/L — ABNORMAL HIGH (ref 3.5–5.1)
Sodium: 137 mmol/L (ref 135–145)

## 2023-05-05 LAB — CBC WITH DIFFERENTIAL/PLATELET
Abs Immature Granulocytes: 0.02 10*3/uL (ref 0.00–0.07)
Basophils Absolute: 0 10*3/uL (ref 0.0–0.1)
Basophils Relative: 1 %
Eosinophils Absolute: 0.1 10*3/uL (ref 0.0–0.5)
Eosinophils Relative: 3 %
HCT: 31.4 % — ABNORMAL LOW (ref 36.0–46.0)
Hemoglobin: 9.2 g/dL — ABNORMAL LOW (ref 12.0–15.0)
Immature Granulocytes: 1 %
Lymphocytes Relative: 20 %
Lymphs Abs: 0.8 10*3/uL (ref 0.7–4.0)
MCH: 23.7 pg — ABNORMAL LOW (ref 26.0–34.0)
MCHC: 29.3 g/dL — ABNORMAL LOW (ref 30.0–36.0)
MCV: 80.7 fL (ref 80.0–100.0)
Monocytes Absolute: 0.4 10*3/uL (ref 0.1–1.0)
Monocytes Relative: 10 %
Neutro Abs: 2.5 10*3/uL (ref 1.7–7.7)
Neutrophils Relative %: 65 %
Platelets: 199 10*3/uL (ref 150–400)
RBC: 3.89 MIL/uL (ref 3.87–5.11)
RDW: 17.3 % — ABNORMAL HIGH (ref 11.5–15.5)
WBC: 3.8 10*3/uL — ABNORMAL LOW (ref 4.0–10.5)
nRBC: 0 % (ref 0.0–0.2)

## 2023-05-05 MED ORDER — SODIUM ZIRCONIUM CYCLOSILICATE 10 G PO PACK
10.0000 g | PACK | Freq: Once | ORAL | Status: DC
  Filled 2023-05-05: qty 1

## 2023-05-05 NOTE — ED Notes (Signed)
 Called to [facility] per RN Bridgette Campus for pt update @1230am / Rep Amy stated still at capacity no bed assignment at this time.

## 2023-05-05 NOTE — ED Provider Notes (Addendum)
 Emergency Medicine Observation Re-evaluation Note  Patricia [name] is a 48 y.o. female, seen on rounds today.  Pt initially presented to the ED for complaints of Chest Pain  Patient resting in bed.  Reports mild ongoing intermittent chest pain, improved.  No other complaints.  Physical Exam  BP (!) 177/89 (BP Location: Left Wrist)   Pulse 76   Temp 98.7 F (37.1 C) (Axillary)   Resp (!) 25   Ht 5\' 7"  (1.702 m)   Wt 104.3 kg   SpO2 100%   BMI 36.02 kg/m  Physical Exam General: Resting in bed Resp: Unlabored Cardiac: Regular rate, normal peripheral perfusion  ED Course / MDM   Patient currently awaiting transfer to [facility] in the setting of recent renal transplant with increased creatinine.  Repeat labs were ordered this morning with stable anemia, improvement in her renal function with a creatinine of 1.53 down from 2.33 2 days ago, does have mild hyperkalemia at 5.7.   Plan   Patient without any acute complaints.  With her downtrending creatinine, rule would review with [facility] transplant team to see if she still requires transfer.  Does report that she has follow-up with her nephrologist, Dr. Ferman Houston, tomorrow.  9:29 AM Case was discussed with Dr. Jana Mcgregor with the [facility] transplant team.  He agrees that in the setting of her improved creatinine and symptoms, he does feel that the patient is appropriate for discharge.  He does recommend giving her a dose of Lokelma  prior to discharge and discharging her out on 10 g every other day for the next week, can further discuss treatment of her hyperkalemia with her nephrologist.   10:15 AM Patient reevaluated and updated on plan for discharge.  She is comfortable with this.  In discussing her hyperkalemia, she notes that she actually takes Lokelma  daily at home, but did not note this during her med rec, so she has not been receiving it while in our ER.  In the setting of this, we will direct her to resume taking her Lokelma  as previously  directed and discuss this further with her visit with her nephrologist tomorrow.  Strict return precautions provided.  Patient discharged stable condition.   Claria Crofts, MD 05/05/23 1016  11:34 AM Awaiting Lokelma  from pharmacy.  Patient reports she has this at home and does not wish to wait longer in her ER to receive this.  Given that this is really accessible to her at home and she does not have a critically elevated potassium, do that this is reasonable. Continue plan for discharge.     Claria Crofts, MD 05/05/23 1136

## 2023-05-05 NOTE — Discharge Instructions (Addendum)
 Keep your scheduled follow-up with your nephrologist tomorrow.  Discuss with them that your potassium was slightly high on your blood work today (5.7), but you were not taking the Lokelma  while in our ER.  Return to the ER for any new or worsening symptoms.

## 2023-05-05 NOTE — ED Notes (Signed)
 Called [facility] spoke with Amy for update on Transfer no capacity at this time maybe later today

## 2023-05-06 DIAGNOSIS — R079 Chest pain, unspecified: Secondary | ICD-10-CM | POA: Diagnosis not present

## 2023-05-06 DIAGNOSIS — R799 Abnormal finding of blood chemistry, unspecified: Secondary | ICD-10-CM | POA: Diagnosis not present

## 2023-05-06 DIAGNOSIS — D849 Immunodeficiency, unspecified: Secondary | ICD-10-CM | POA: Diagnosis not present

## 2023-05-06 DIAGNOSIS — N186 End stage renal disease: Secondary | ICD-10-CM | POA: Diagnosis not present

## 2023-05-06 DIAGNOSIS — I471 Supraventricular tachycardia, unspecified: Secondary | ICD-10-CM | POA: Diagnosis not present

## 2023-05-06 DIAGNOSIS — I10 Essential (primary) hypertension: Secondary | ICD-10-CM | POA: Diagnosis not present

## 2023-05-06 DIAGNOSIS — Z940 Kidney transplant status: Secondary | ICD-10-CM | POA: Diagnosis not present

## 2023-05-06 LAB — TACROLIMUS LEVEL: Tacrolimus (FK506) - LabCorp: 11.6 ng/mL (ref 5.0–20.0)

## 2023-05-13 ENCOUNTER — Other Ambulatory Visit
Admission: RE | Admit: 2023-05-13 | Discharge: 2023-05-13 | Disposition: A | Discharge status: Home / Self Care | Source: Ambulatory Visit | Attending: Nephrology | Admitting: Nephrology

## 2023-05-13 DIAGNOSIS — B259 Cytomegaloviral disease, unspecified: Secondary | ICD-10-CM | POA: Insufficient documentation

## 2023-05-13 DIAGNOSIS — D631 Anemia in chronic kidney disease: Secondary | ICD-10-CM | POA: Insufficient documentation

## 2023-05-13 DIAGNOSIS — Z9483 Pancreas transplant status: Secondary | ICD-10-CM | POA: Insufficient documentation

## 2023-05-13 DIAGNOSIS — Z789 Other specified health status: Secondary | ICD-10-CM | POA: Insufficient documentation

## 2023-05-13 DIAGNOSIS — E559 Vitamin D deficiency, unspecified: Secondary | ICD-10-CM | POA: Diagnosis not present

## 2023-05-13 DIAGNOSIS — N390 Urinary tract infection, site not specified: Secondary | ICD-10-CM | POA: Diagnosis not present

## 2023-05-13 DIAGNOSIS — D890 Polyclonal hypergammaglobulinemia: Secondary | ICD-10-CM | POA: Diagnosis not present

## 2023-05-13 DIAGNOSIS — Z09 Encounter for follow-up examination after completed treatment for conditions other than malignant neoplasm: Secondary | ICD-10-CM | POA: Diagnosis not present

## 2023-05-13 DIAGNOSIS — T8610 Unspecified complication of kidney transplant: Secondary | ICD-10-CM | POA: Insufficient documentation

## 2023-05-13 DIAGNOSIS — Z114 Encounter for screening for human immunodeficiency virus [HIV]: Secondary | ICD-10-CM | POA: Diagnosis not present

## 2023-05-13 DIAGNOSIS — Z79899 Other long term (current) drug therapy: Secondary | ICD-10-CM | POA: Diagnosis not present

## 2023-05-13 DIAGNOSIS — E1129 Type 2 diabetes mellitus with other diabetic kidney complication: Secondary | ICD-10-CM | POA: Diagnosis not present

## 2023-05-13 DIAGNOSIS — N189 Chronic kidney disease, unspecified: Secondary | ICD-10-CM | POA: Diagnosis not present

## 2023-05-13 LAB — BASIC METABOLIC PANEL WITH GFR
Anion gap: 9 (ref 5–15)
BUN: 29 mg/dL — ABNORMAL HIGH (ref 6–20)
CO2: 20 mmol/L — ABNORMAL LOW (ref 22–32)
Calcium: 10 mg/dL (ref 8.9–10.3)
Chloride: 110 mmol/L (ref 98–111)
Creatinine, Ser: 1.72 mg/dL — ABNORMAL HIGH (ref 0.44–1.00)
GFR, Estimated: 36 mL/min — ABNORMAL LOW (ref >60)
Glucose, Bld: 113 mg/dL — ABNORMAL HIGH (ref 70–99)
Potassium: 3.8 mmol/L (ref 3.5–5.1)
Sodium: 139 mmol/L (ref 135–145)

## 2023-05-13 LAB — CBC WITH DIFFERENTIAL/PLATELET
Abs Immature Granulocytes: 0.02 10*3/uL (ref 0.00–0.07)
Basophils Absolute: 0 10*3/uL (ref 0.0–0.1)
Basophils Relative: 1 %
Eosinophils Absolute: 0.1 10*3/uL (ref 0.0–0.5)
Eosinophils Relative: 3 %
HCT: 32.9 % — ABNORMAL LOW (ref 36.0–46.0)
Hemoglobin: 10 g/dL — ABNORMAL LOW (ref 12.0–15.0)
Immature Granulocytes: 1 %
Lymphocytes Relative: 13 %
Lymphs Abs: 0.5 10*3/uL — ABNORMAL LOW (ref 0.7–4.0)
MCH: 23.9 pg — ABNORMAL LOW (ref 26.0–34.0)
MCHC: 30.4 g/dL (ref 30.0–36.0)
MCV: 78.7 fL — ABNORMAL LOW (ref 80.0–100.0)
Monocytes Absolute: 0.3 10*3/uL (ref 0.1–1.0)
Monocytes Relative: 7 %
Neutro Abs: 3.1 10*3/uL (ref 1.7–7.7)
Neutrophils Relative %: 75 %
Platelets: 221 10*3/uL (ref 150–400)
RBC: 4.18 MIL/uL (ref 3.87–5.11)
RDW: 17.9 % — ABNORMAL HIGH (ref 11.5–15.5)
WBC: 4.1 10*3/uL (ref 4.0–10.5)
nRBC: 0 % (ref 0.0–0.2)

## 2023-05-13 LAB — URINALYSIS, ROUTINE W REFLEX MICROSCOPIC
Bilirubin Urine: NEGATIVE
Glucose, UA: NEGATIVE mg/dL
Hgb urine dipstick: NEGATIVE
Ketones, ur: NEGATIVE mg/dL
Leukocytes,Ua: NEGATIVE
Nitrite: NEGATIVE
Protein, ur: 100 mg/dL — AB
Specific Gravity, Urine: 1.024 (ref 1.005–1.030)
pH: 5 (ref 5.0–8.0)

## 2023-05-13 LAB — MAGNESIUM: Magnesium: 1.4 mg/dL — ABNORMAL LOW (ref 1.7–2.4)

## 2023-05-13 LAB — PHOSPHORUS: Phosphorus: 2.3 mg/dL — ABNORMAL LOW (ref 2.5–4.6)

## 2023-05-14 LAB — TACROLIMUS LEVEL: Tacrolimus (FK506) - LabCorp: 7.1 ng/mL (ref 5.0–20.0)

## 2023-05-17 LAB — URINE CULTURE: Culture: 20000 — AB

## 2023-05-19 ENCOUNTER — Other Ambulatory Visit
Admission: RE | Admit: 2023-05-19 | Discharge: 2023-05-19 | Disposition: A | Discharge status: Home / Self Care | Attending: Nephrology | Admitting: Nephrology

## 2023-05-19 DIAGNOSIS — N189 Chronic kidney disease, unspecified: Secondary | ICD-10-CM | POA: Diagnosis not present

## 2023-05-19 DIAGNOSIS — D631 Anemia in chronic kidney disease: Secondary | ICD-10-CM | POA: Insufficient documentation

## 2023-05-19 DIAGNOSIS — Z09 Encounter for follow-up examination after completed treatment for conditions other than malignant neoplasm: Secondary | ICD-10-CM | POA: Diagnosis not present

## 2023-05-19 DIAGNOSIS — N390 Urinary tract infection, site not specified: Secondary | ICD-10-CM | POA: Diagnosis not present

## 2023-05-19 DIAGNOSIS — E1129 Type 2 diabetes mellitus with other diabetic kidney complication: Secondary | ICD-10-CM | POA: Diagnosis not present

## 2023-05-19 DIAGNOSIS — Z789 Other specified health status: Secondary | ICD-10-CM | POA: Insufficient documentation

## 2023-05-19 DIAGNOSIS — Z9483 Pancreas transplant status: Secondary | ICD-10-CM | POA: Insufficient documentation

## 2023-05-19 DIAGNOSIS — T8610 Unspecified complication of kidney transplant: Secondary | ICD-10-CM | POA: Insufficient documentation

## 2023-05-19 DIAGNOSIS — B259 Cytomegaloviral disease, unspecified: Secondary | ICD-10-CM | POA: Diagnosis not present

## 2023-05-19 DIAGNOSIS — Z79899 Other long term (current) drug therapy: Secondary | ICD-10-CM | POA: Diagnosis not present

## 2023-05-19 DIAGNOSIS — E559 Vitamin D deficiency, unspecified: Secondary | ICD-10-CM | POA: Diagnosis not present

## 2023-05-19 DIAGNOSIS — D899 Disorder involving the immune mechanism, unspecified: Secondary | ICD-10-CM | POA: Insufficient documentation

## 2023-05-19 DIAGNOSIS — Z114 Encounter for screening for human immunodeficiency virus [HIV]: Secondary | ICD-10-CM | POA: Insufficient documentation

## 2023-05-19 LAB — CBC WITH DIFFERENTIAL/PLATELET
Abs Immature Granulocytes: 0.05 10*3/uL (ref 0.00–0.07)
Basophils Absolute: 0 10*3/uL (ref 0.0–0.1)
Basophils Relative: 1 %
Eosinophils Absolute: 0.2 10*3/uL (ref 0.0–0.5)
Eosinophils Relative: 4 %
HCT: 35.1 % — ABNORMAL LOW (ref 36.0–46.0)
Hemoglobin: 10.5 g/dL — ABNORMAL LOW (ref 12.0–15.0)
Immature Granulocytes: 1 %
Lymphocytes Relative: 15 %
Lymphs Abs: 0.7 10*3/uL (ref 0.7–4.0)
MCH: 23.3 pg — ABNORMAL LOW (ref 26.0–34.0)
MCHC: 29.9 g/dL — ABNORMAL LOW (ref 30.0–36.0)
MCV: 78 fL — ABNORMAL LOW (ref 80.0–100.0)
Monocytes Absolute: 0.4 10*3/uL (ref 0.1–1.0)
Monocytes Relative: 8 %
Neutro Abs: 3.3 10*3/uL (ref 1.7–7.7)
Neutrophils Relative %: 71 %
Platelets: 260 10*3/uL (ref 150–400)
RBC: 4.5 MIL/uL (ref 3.87–5.11)
RDW: 17.9 % — ABNORMAL HIGH (ref 11.5–15.5)
WBC: 4.6 10*3/uL (ref 4.0–10.5)
nRBC: 0 % (ref 0.0–0.2)

## 2023-05-19 LAB — URINALYSIS, ROUTINE W REFLEX MICROSCOPIC
Bacteria, UA: NONE SEEN
Bilirubin Urine: NEGATIVE
Glucose, UA: NEGATIVE mg/dL
Hgb urine dipstick: NEGATIVE
Ketones, ur: NEGATIVE mg/dL
Leukocytes,Ua: NEGATIVE
Nitrite: NEGATIVE
Protein, ur: 30 mg/dL — AB
Specific Gravity, Urine: 1.017 (ref 1.005–1.030)
pH: 5 (ref 5.0–8.0)

## 2023-05-19 LAB — BASIC METABOLIC PANEL WITH GFR
Anion gap: 8 (ref 5–15)
BUN: 28 mg/dL — ABNORMAL HIGH (ref 6–20)
CO2: 23 mmol/L (ref 22–32)
Calcium: 10.3 mg/dL (ref 8.9–10.3)
Chloride: 107 mmol/L (ref 98–111)
Creatinine, Ser: 1.36 mg/dL — ABNORMAL HIGH (ref 0.44–1.00)
GFR, Estimated: 48 mL/min — ABNORMAL LOW (ref >60)
Glucose, Bld: 142 mg/dL — ABNORMAL HIGH (ref 70–99)
Potassium: 4.7 mmol/L (ref 3.5–5.1)
Sodium: 138 mmol/L (ref 135–145)

## 2023-05-19 LAB — PROTEIN / CREATININE RATIO, URINE
Creatinine, Urine: 110 mg/dL
Protein Creatinine Ratio: 0.3 mg/mg{creat} — ABNORMAL HIGH (ref 0.00–0.15)
Total Protein, Urine: 33 mg/dL

## 2023-05-19 LAB — MAGNESIUM: Magnesium: 1.5 mg/dL — ABNORMAL LOW (ref 1.7–2.4)

## 2023-05-19 LAB — PHOSPHORUS: Phosphorus: 2.4 mg/dL — ABNORMAL LOW (ref 2.5–4.6)

## 2023-05-20 LAB — URINE CULTURE: Culture: NO GROWTH

## 2023-05-20 LAB — MICROALBUMIN / CREATININE URINE RATIO
Creatinine, Urine: 97.4 mg/dL
Microalb Creat Ratio: 174 mg/g{creat} — ABNORMAL HIGH (ref 0–29)
Microalb, Ur: 169.7 ug/mL — ABNORMAL HIGH

## 2023-05-20 LAB — TACROLIMUS LEVEL: Tacrolimus (FK506) - LabCorp: 6.7 ng/mL (ref 5.0–20.0)

## 2023-05-23 DIAGNOSIS — G63 Polyneuropathy in diseases classified elsewhere: Secondary | ICD-10-CM | POA: Diagnosis not present

## 2023-05-23 DIAGNOSIS — F5101 Primary insomnia: Secondary | ICD-10-CM | POA: Diagnosis not present

## 2023-05-23 DIAGNOSIS — N186 End stage renal disease: Secondary | ICD-10-CM | POA: Diagnosis not present

## 2023-05-26 ENCOUNTER — Other Ambulatory Visit
Admission: RE | Admit: 2023-05-26 | Discharge: 2023-05-26 | Disposition: A | Discharge status: Home / Self Care | Attending: Nephrology | Admitting: Nephrology

## 2023-05-26 DIAGNOSIS — Z9483 Pancreas transplant status: Secondary | ICD-10-CM | POA: Diagnosis not present

## 2023-05-26 DIAGNOSIS — N261 Atrophy of kidney (terminal): Secondary | ICD-10-CM | POA: Diagnosis not present

## 2023-05-26 DIAGNOSIS — I700 Atherosclerosis of aorta: Secondary | ICD-10-CM | POA: Diagnosis not present

## 2023-05-26 DIAGNOSIS — E1129 Type 2 diabetes mellitus with other diabetic kidney complication: Secondary | ICD-10-CM | POA: Diagnosis not present

## 2023-05-26 DIAGNOSIS — E559 Vitamin D deficiency, unspecified: Secondary | ICD-10-CM | POA: Diagnosis not present

## 2023-05-26 DIAGNOSIS — Z940 Kidney transplant status: Secondary | ICD-10-CM | POA: Insufficient documentation

## 2023-05-26 DIAGNOSIS — I2510 Atherosclerotic heart disease of native coronary artery without angina pectoris: Secondary | ICD-10-CM | POA: Diagnosis not present

## 2023-05-26 DIAGNOSIS — B259 Cytomegaloviral disease, unspecified: Secondary | ICD-10-CM | POA: Diagnosis not present

## 2023-05-26 DIAGNOSIS — D899 Disorder involving the immune mechanism, unspecified: Secondary | ICD-10-CM | POA: Diagnosis not present

## 2023-05-26 DIAGNOSIS — R079 Chest pain, unspecified: Secondary | ICD-10-CM | POA: Diagnosis not present

## 2023-05-26 DIAGNOSIS — N189 Chronic kidney disease, unspecified: Secondary | ICD-10-CM | POA: Insufficient documentation

## 2023-05-26 DIAGNOSIS — N390 Urinary tract infection, site not specified: Secondary | ICD-10-CM | POA: Diagnosis not present

## 2023-05-26 DIAGNOSIS — D631 Anemia in chronic kidney disease: Secondary | ICD-10-CM | POA: Diagnosis not present

## 2023-05-26 DIAGNOSIS — Z114 Encounter for screening for human immunodeficiency virus [HIV]: Secondary | ICD-10-CM | POA: Diagnosis not present

## 2023-05-26 DIAGNOSIS — Z79899 Other long term (current) drug therapy: Secondary | ICD-10-CM | POA: Diagnosis not present

## 2023-05-26 LAB — CBC WITH DIFFERENTIAL/PLATELET
Abs Immature Granulocytes: 0.05 10*3/uL (ref 0.00–0.07)
Basophils Absolute: 0 10*3/uL (ref 0.0–0.1)
Basophils Relative: 0 %
Eosinophils Absolute: 0.1 10*3/uL (ref 0.0–0.5)
Eosinophils Relative: 2 %
HCT: 33.2 % — ABNORMAL LOW (ref 36.0–46.0)
Hemoglobin: 10 g/dL — ABNORMAL LOW (ref 12.0–15.0)
Immature Granulocytes: 1 %
Lymphocytes Relative: 12 %
Lymphs Abs: 0.7 10*3/uL (ref 0.7–4.0)
MCH: 23.6 pg — ABNORMAL LOW (ref 26.0–34.0)
MCHC: 30.1 g/dL (ref 30.0–36.0)
MCV: 78.3 fL — ABNORMAL LOW (ref 80.0–100.0)
Monocytes Absolute: 0.6 10*3/uL (ref 0.1–1.0)
Monocytes Relative: 10 %
Neutro Abs: 4.6 10*3/uL (ref 1.7–7.7)
Neutrophils Relative %: 75 %
Platelets: 286 10*3/uL (ref 150–400)
RBC: 4.24 MIL/uL (ref 3.87–5.11)
RDW: 17.6 % — ABNORMAL HIGH (ref 11.5–15.5)
WBC: 6.1 10*3/uL (ref 4.0–10.5)
nRBC: 0 % (ref 0.0–0.2)

## 2023-05-26 LAB — BASIC METABOLIC PANEL WITH GFR
Anion gap: 9 (ref 5–15)
BUN: 34 mg/dL — ABNORMAL HIGH (ref 6–20)
CO2: 21 mmol/L — ABNORMAL LOW (ref 22–32)
Calcium: 10.3 mg/dL (ref 8.9–10.3)
Chloride: 110 mmol/L (ref 98–111)
Creatinine, Ser: 1.53 mg/dL — ABNORMAL HIGH (ref 0.44–1.00)
GFR, Estimated: 41 mL/min — ABNORMAL LOW (ref >60)
Glucose, Bld: 132 mg/dL — ABNORMAL HIGH (ref 70–99)
Potassium: 4.9 mmol/L (ref 3.5–5.1)
Sodium: 140 mmol/L (ref 135–145)

## 2023-05-26 LAB — URINALYSIS, ROUTINE W REFLEX MICROSCOPIC
Bilirubin Urine: NEGATIVE
Glucose, UA: NEGATIVE mg/dL
Hgb urine dipstick: NEGATIVE
Ketones, ur: NEGATIVE mg/dL
Leukocytes,Ua: NEGATIVE
Nitrite: NEGATIVE
Protein, ur: 30 mg/dL — AB
Specific Gravity, Urine: 1.025 (ref 1.005–1.030)
pH: 5 (ref 5.0–8.0)

## 2023-05-26 LAB — PROTEIN / CREATININE RATIO, URINE
Creatinine, Urine: 164 mg/dL
Protein Creatinine Ratio: 0.22 mg/mg{creat} — ABNORMAL HIGH (ref 0.00–0.15)
Total Protein, Urine: 36 mg/dL

## 2023-05-26 LAB — MAGNESIUM: Magnesium: 1.7 mg/dL (ref 1.7–2.4)

## 2023-05-26 LAB — PHOSPHORUS: Phosphorus: 2.8 mg/dL (ref 2.5–4.6)

## 2023-05-27 LAB — MICROALBUMIN / CREATININE URINE RATIO
Creatinine, Urine: 149.8 mg/dL
Microalb Creat Ratio: 107 mg/g{creat} — ABNORMAL HIGH (ref 0–29)
Microalb, Ur: 160.8 ug/mL — ABNORMAL HIGH

## 2023-05-28 LAB — URINE CULTURE: Culture: <10000 — AB

## 2023-05-28 LAB — TACROLIMUS LEVEL: Tacrolimus (FK506) - LabCorp: 6.8 ng/mL (ref 5.0–20.0)

## 2023-06-02 ENCOUNTER — Other Ambulatory Visit
Admission: RE | Admit: 2023-06-02 | Discharge: 2023-06-02 | Disposition: A | Discharge status: Home / Self Care | Attending: Nephrology | Admitting: Nephrology

## 2023-06-02 DIAGNOSIS — B259 Cytomegaloviral disease, unspecified: Secondary | ICD-10-CM | POA: Insufficient documentation

## 2023-06-02 DIAGNOSIS — D899 Disorder involving the immune mechanism, unspecified: Secondary | ICD-10-CM | POA: Diagnosis not present

## 2023-06-02 DIAGNOSIS — D631 Anemia in chronic kidney disease: Secondary | ICD-10-CM | POA: Insufficient documentation

## 2023-06-02 DIAGNOSIS — Z9483 Pancreas transplant status: Secondary | ICD-10-CM | POA: Diagnosis not present

## 2023-06-02 DIAGNOSIS — E1129 Type 2 diabetes mellitus with other diabetic kidney complication: Secondary | ICD-10-CM | POA: Diagnosis not present

## 2023-06-02 DIAGNOSIS — E559 Vitamin D deficiency, unspecified: Secondary | ICD-10-CM | POA: Insufficient documentation

## 2023-06-02 DIAGNOSIS — Z789 Other specified health status: Secondary | ICD-10-CM | POA: Diagnosis not present

## 2023-06-02 DIAGNOSIS — T8610 Unspecified complication of kidney transplant: Secondary | ICD-10-CM | POA: Insufficient documentation

## 2023-06-02 DIAGNOSIS — N390 Urinary tract infection, site not specified: Secondary | ICD-10-CM | POA: Diagnosis not present

## 2023-06-02 DIAGNOSIS — Z114 Encounter for screening for human immunodeficiency virus [HIV]: Secondary | ICD-10-CM | POA: Diagnosis not present

## 2023-06-02 DIAGNOSIS — Z09 Encounter for follow-up examination after completed treatment for conditions other than malignant neoplasm: Secondary | ICD-10-CM | POA: Diagnosis not present

## 2023-06-02 DIAGNOSIS — Z940 Kidney transplant status: Secondary | ICD-10-CM | POA: Insufficient documentation

## 2023-06-02 DIAGNOSIS — Z79899 Other long term (current) drug therapy: Secondary | ICD-10-CM | POA: Insufficient documentation

## 2023-06-02 LAB — CBC WITH DIFFERENTIAL/PLATELET
Abs Immature Granulocytes: 0.03 10*3/uL (ref 0.00–0.07)
Basophils Absolute: 0 10*3/uL (ref 0.0–0.1)
Basophils Relative: 1 %
Eosinophils Absolute: 0.1 10*3/uL (ref 0.0–0.5)
Eosinophils Relative: 2 %
HCT: 32.9 % — ABNORMAL LOW (ref 36.0–46.0)
Hemoglobin: 10 g/dL — ABNORMAL LOW (ref 12.0–15.0)
Immature Granulocytes: 1 %
Lymphocytes Relative: 15 %
Lymphs Abs: 0.7 10*3/uL (ref 0.7–4.0)
MCH: 23.5 pg — ABNORMAL LOW (ref 26.0–34.0)
MCHC: 30.4 g/dL (ref 30.0–36.0)
MCV: 77.4 fL — ABNORMAL LOW (ref 80.0–100.0)
Monocytes Absolute: 0.5 10*3/uL (ref 0.1–1.0)
Monocytes Relative: 12 %
Neutro Abs: 3.3 10*3/uL (ref 1.7–7.7)
Neutrophils Relative %: 69 %
Platelets: 247 10*3/uL (ref 150–400)
RBC: 4.25 MIL/uL (ref 3.87–5.11)
RDW: 17.1 % — ABNORMAL HIGH (ref 11.5–15.5)
WBC: 4.7 10*3/uL (ref 4.0–10.5)
nRBC: 0 % (ref 0.0–0.2)

## 2023-06-02 LAB — BASIC METABOLIC PANEL WITH GFR
Anion gap: 6 (ref 5–15)
BUN: 34 mg/dL — ABNORMAL HIGH (ref 6–20)
CO2: 21 mmol/L — ABNORMAL LOW (ref 22–32)
Calcium: 10.1 mg/dL (ref 8.9–10.3)
Chloride: 109 mmol/L (ref 98–111)
Creatinine, Ser: 1.47 mg/dL — ABNORMAL HIGH (ref 0.44–1.00)
GFR, Estimated: 43 mL/min — ABNORMAL LOW (ref >60)
Glucose, Bld: 113 mg/dL — ABNORMAL HIGH (ref 70–99)
Potassium: 5 mmol/L (ref 3.5–5.1)
Sodium: 136 mmol/L (ref 135–145)

## 2023-06-02 LAB — MAGNESIUM: Magnesium: 1.4 mg/dL — ABNORMAL LOW (ref 1.7–2.4)

## 2023-06-02 LAB — PHOSPHORUS: Phosphorus: 2.3 mg/dL — ABNORMAL LOW (ref 2.5–4.6)

## 2023-06-03 DIAGNOSIS — G4733 Obstructive sleep apnea (adult) (pediatric): Secondary | ICD-10-CM | POA: Diagnosis not present

## 2023-06-03 LAB — TACROLIMUS LEVEL: Tacrolimus (FK506) - LabCorp: 8.1 ng/mL (ref 5.0–20.0)

## 2023-06-07 DIAGNOSIS — R7989 Other specified abnormal findings of blood chemistry: Secondary | ICD-10-CM | POA: Diagnosis not present

## 2023-06-07 DIAGNOSIS — I120 Hypertensive chronic kidney disease with stage 5 chronic kidney disease or end stage renal disease: Secondary | ICD-10-CM | POA: Diagnosis not present

## 2023-06-07 DIAGNOSIS — R1011 Right upper quadrant pain: Secondary | ICD-10-CM | POA: Diagnosis not present

## 2023-06-07 DIAGNOSIS — Z992 Dependence on renal dialysis: Secondary | ICD-10-CM | POA: Diagnosis not present

## 2023-06-07 DIAGNOSIS — E785 Hyperlipidemia, unspecified: Secondary | ICD-10-CM | POA: Diagnosis not present

## 2023-06-07 DIAGNOSIS — Z886 Allergy status to analgesic agent status: Secondary | ICD-10-CM | POA: Diagnosis not present

## 2023-06-07 DIAGNOSIS — E1142 Type 2 diabetes mellitus with diabetic polyneuropathy: Secondary | ICD-10-CM | POA: Diagnosis not present

## 2023-06-07 DIAGNOSIS — Z940 Kidney transplant status: Secondary | ICD-10-CM | POA: Diagnosis not present

## 2023-06-07 DIAGNOSIS — R0789 Other chest pain: Secondary | ICD-10-CM | POA: Diagnosis not present

## 2023-06-07 DIAGNOSIS — E1122 Type 2 diabetes mellitus with diabetic chronic kidney disease: Secondary | ICD-10-CM | POA: Diagnosis not present

## 2023-06-07 DIAGNOSIS — N186 End stage renal disease: Secondary | ICD-10-CM | POA: Diagnosis not present

## 2023-06-07 DIAGNOSIS — I151 Hypertension secondary to other renal disorders: Secondary | ICD-10-CM | POA: Diagnosis not present

## 2023-06-07 DIAGNOSIS — K429 Umbilical hernia without obstruction or gangrene: Secondary | ICD-10-CM | POA: Diagnosis not present

## 2023-06-07 DIAGNOSIS — Z885 Allergy status to narcotic agent status: Secondary | ICD-10-CM | POA: Diagnosis not present

## 2023-06-07 DIAGNOSIS — Z888 Allergy status to other drugs, medicaments and biological substances status: Secondary | ICD-10-CM | POA: Diagnosis not present

## 2023-06-07 DIAGNOSIS — R0602 Shortness of breath: Secondary | ICD-10-CM | POA: Diagnosis not present

## 2023-06-09 DIAGNOSIS — N200 Calculus of kidney: Secondary | ICD-10-CM | POA: Diagnosis not present

## 2023-06-09 DIAGNOSIS — E7800 Pure hypercholesterolemia, unspecified: Secondary | ICD-10-CM | POA: Diagnosis not present

## 2023-06-09 DIAGNOSIS — D849 Immunodeficiency, unspecified: Secondary | ICD-10-CM | POA: Diagnosis not present

## 2023-06-09 DIAGNOSIS — I10 Essential (primary) hypertension: Secondary | ICD-10-CM | POA: Diagnosis not present

## 2023-06-09 DIAGNOSIS — Z940 Kidney transplant status: Secondary | ICD-10-CM | POA: Diagnosis not present

## 2023-06-09 DIAGNOSIS — G894 Chronic pain syndrome: Secondary | ICD-10-CM | POA: Diagnosis not present

## 2023-06-11 ENCOUNTER — Telehealth: Payer: Self-pay

## 2023-06-11 NOTE — Telephone Encounter (Signed)
 Prior Authorization request for the drug Qutenza 8% Topical System has been denied on 05/23/2023. It has been denied as they need tried and fail of (1) of (Nortriptyline, Desipramine & Venlafaxine ) before moving on to Qutenza 8% topical System.

## 2023-06-18 DIAGNOSIS — G4733 Obstructive sleep apnea (adult) (pediatric): Secondary | ICD-10-CM | POA: Diagnosis not present

## 2023-06-21 DIAGNOSIS — G63 Polyneuropathy in diseases classified elsewhere: Secondary | ICD-10-CM | POA: Diagnosis not present

## 2023-06-21 DIAGNOSIS — I10 Essential (primary) hypertension: Secondary | ICD-10-CM | POA: Diagnosis not present

## 2023-06-21 DIAGNOSIS — Z940 Kidney transplant status: Secondary | ICD-10-CM | POA: Diagnosis not present

## 2023-06-21 DIAGNOSIS — D849 Immunodeficiency, unspecified: Secondary | ICD-10-CM | POA: Diagnosis not present

## 2023-06-21 DIAGNOSIS — M76891 Other specified enthesopathies of right lower limb, excluding foot: Secondary | ICD-10-CM | POA: Diagnosis not present

## 2023-06-23 DIAGNOSIS — G63 Polyneuropathy in diseases classified elsewhere: Secondary | ICD-10-CM | POA: Diagnosis not present

## 2023-06-23 DIAGNOSIS — N186 End stage renal disease: Secondary | ICD-10-CM | POA: Diagnosis not present

## 2023-06-23 DIAGNOSIS — F5101 Primary insomnia: Secondary | ICD-10-CM | POA: Diagnosis not present

## 2023-06-25 ENCOUNTER — Other Ambulatory Visit
Admission: RE | Admit: 2023-06-25 | Discharge: 2023-06-25 | Disposition: A | Discharge status: Home / Self Care | Attending: Nephrology | Admitting: Nephrology

## 2023-06-25 DIAGNOSIS — Z992 Dependence on renal dialysis: Secondary | ICD-10-CM | POA: Diagnosis not present

## 2023-06-25 DIAGNOSIS — E1142 Type 2 diabetes mellitus with diabetic polyneuropathy: Secondary | ICD-10-CM | POA: Diagnosis not present

## 2023-06-25 DIAGNOSIS — T8610 Unspecified complication of kidney transplant: Secondary | ICD-10-CM | POA: Diagnosis not present

## 2023-06-25 DIAGNOSIS — Z9483 Pancreas transplant status: Secondary | ICD-10-CM | POA: Insufficient documentation

## 2023-06-25 DIAGNOSIS — I700 Atherosclerosis of aorta: Secondary | ICD-10-CM | POA: Diagnosis not present

## 2023-06-25 DIAGNOSIS — D849 Immunodeficiency, unspecified: Secondary | ICD-10-CM | POA: Diagnosis not present

## 2023-06-25 DIAGNOSIS — Z09 Encounter for follow-up examination after completed treatment for conditions other than malignant neoplasm: Secondary | ICD-10-CM | POA: Insufficient documentation

## 2023-06-25 DIAGNOSIS — Z114 Encounter for screening for human immunodeficiency virus [HIV]: Secondary | ICD-10-CM | POA: Diagnosis not present

## 2023-06-25 DIAGNOSIS — E1122 Type 2 diabetes mellitus with diabetic chronic kidney disease: Secondary | ICD-10-CM | POA: Diagnosis not present

## 2023-06-25 DIAGNOSIS — E875 Hyperkalemia: Secondary | ICD-10-CM | POA: Diagnosis not present

## 2023-06-25 DIAGNOSIS — E1143 Type 2 diabetes mellitus with diabetic autonomic (poly)neuropathy: Secondary | ICD-10-CM | POA: Diagnosis not present

## 2023-06-25 DIAGNOSIS — R10817 Generalized abdominal tenderness: Secondary | ICD-10-CM | POA: Diagnosis not present

## 2023-06-25 DIAGNOSIS — N186 End stage renal disease: Secondary | ICD-10-CM | POA: Diagnosis not present

## 2023-06-25 DIAGNOSIS — D631 Anemia in chronic kidney disease: Secondary | ICD-10-CM | POA: Diagnosis not present

## 2023-06-25 DIAGNOSIS — Z79899 Other long term (current) drug therapy: Secondary | ICD-10-CM | POA: Insufficient documentation

## 2023-06-25 DIAGNOSIS — N1832 Chronic kidney disease, stage 3b: Secondary | ICD-10-CM | POA: Diagnosis not present

## 2023-06-25 DIAGNOSIS — E785 Hyperlipidemia, unspecified: Secondary | ICD-10-CM | POA: Diagnosis not present

## 2023-06-25 DIAGNOSIS — D899 Disorder involving the immune mechanism, unspecified: Secondary | ICD-10-CM | POA: Diagnosis not present

## 2023-06-25 DIAGNOSIS — I120 Hypertensive chronic kidney disease with stage 5 chronic kidney disease or end stage renal disease: Secondary | ICD-10-CM | POA: Diagnosis not present

## 2023-06-25 DIAGNOSIS — E1129 Type 2 diabetes mellitus with other diabetic kidney complication: Secondary | ICD-10-CM | POA: Insufficient documentation

## 2023-06-25 DIAGNOSIS — Z940 Kidney transplant status: Secondary | ICD-10-CM | POA: Diagnosis not present

## 2023-06-25 DIAGNOSIS — M109 Gout, unspecified: Secondary | ICD-10-CM | POA: Diagnosis not present

## 2023-06-25 DIAGNOSIS — N390 Urinary tract infection, site not specified: Secondary | ICD-10-CM | POA: Insufficient documentation

## 2023-06-25 DIAGNOSIS — R1031 Right lower quadrant pain: Secondary | ICD-10-CM | POA: Diagnosis not present

## 2023-06-25 DIAGNOSIS — K3184 Gastroparesis: Secondary | ICD-10-CM | POA: Diagnosis not present

## 2023-06-25 DIAGNOSIS — B259 Cytomegaloviral disease, unspecified: Secondary | ICD-10-CM | POA: Diagnosis not present

## 2023-06-25 DIAGNOSIS — Z789 Other specified health status: Secondary | ICD-10-CM | POA: Diagnosis not present

## 2023-06-25 DIAGNOSIS — I129 Hypertensive chronic kidney disease with stage 1 through stage 4 chronic kidney disease, or unspecified chronic kidney disease: Secondary | ICD-10-CM | POA: Diagnosis not present

## 2023-06-25 DIAGNOSIS — K8020 Calculus of gallbladder without cholecystitis without obstruction: Secondary | ICD-10-CM | POA: Diagnosis not present

## 2023-06-25 DIAGNOSIS — K219 Gastro-esophageal reflux disease without esophagitis: Secondary | ICD-10-CM | POA: Diagnosis not present

## 2023-06-25 DIAGNOSIS — Z79621 Long term (current) use of calcineurin inhibitor: Secondary | ICD-10-CM | POA: Diagnosis not present

## 2023-06-25 DIAGNOSIS — N200 Calculus of kidney: Secondary | ICD-10-CM | POA: Diagnosis not present

## 2023-06-25 DIAGNOSIS — R188 Other ascites: Secondary | ICD-10-CM | POA: Diagnosis not present

## 2023-06-25 DIAGNOSIS — K449 Diaphragmatic hernia without obstruction or gangrene: Secondary | ICD-10-CM | POA: Diagnosis not present

## 2023-06-25 DIAGNOSIS — Z79624 Long term (current) use of inhibitors of nucleotide synthesis: Secondary | ICD-10-CM | POA: Diagnosis not present

## 2023-06-25 DIAGNOSIS — Z7982 Long term (current) use of aspirin: Secondary | ICD-10-CM | POA: Diagnosis not present

## 2023-06-25 DIAGNOSIS — Z792 Long term (current) use of antibiotics: Secondary | ICD-10-CM | POA: Diagnosis not present

## 2023-06-25 LAB — CBC WITH DIFFERENTIAL/PLATELET
Abs Immature Granulocytes: 0.01 10*3/uL (ref 0.00–0.07)
Basophils Absolute: 0 10*3/uL (ref 0.0–0.1)
Basophils Relative: 1 %
Eosinophils Absolute: 0.1 10*3/uL (ref 0.0–0.5)
Eosinophils Relative: 3 %
HCT: 40.7 % (ref 36.0–46.0)
Hemoglobin: 12.2 g/dL (ref 12.0–15.0)
Immature Granulocytes: 0 %
Lymphocytes Relative: 22 %
Lymphs Abs: 0.8 10*3/uL (ref 0.7–4.0)
MCH: 23.4 pg — ABNORMAL LOW (ref 26.0–34.0)
MCHC: 30 g/dL (ref 30.0–36.0)
MCV: 78 fL — ABNORMAL LOW (ref 80.0–100.0)
Monocytes Absolute: 0.5 10*3/uL (ref 0.1–1.0)
Monocytes Relative: 14 %
Neutro Abs: 2.1 10*3/uL (ref 1.7–7.7)
Neutrophils Relative %: 60 %
Platelets: 278 10*3/uL (ref 150–400)
RBC: 5.22 MIL/uL — ABNORMAL HIGH (ref 3.87–5.11)
RDW: 15.1 % (ref 11.5–15.5)
WBC: 3.6 10*3/uL — ABNORMAL LOW (ref 4.0–10.5)
nRBC: 0 % (ref 0.0–0.2)

## 2023-06-25 LAB — BASIC METABOLIC PANEL WITH GFR
Anion gap: 5 (ref 5–15)
BUN: 34 mg/dL — ABNORMAL HIGH (ref 6–20)
CO2: 24 mmol/L (ref 22–32)
Calcium: 10.7 mg/dL — ABNORMAL HIGH (ref 8.9–10.3)
Chloride: 108 mmol/L (ref 98–111)
Creatinine, Ser: 1.52 mg/dL — ABNORMAL HIGH (ref 0.44–1.00)
GFR, Estimated: 42 mL/min — ABNORMAL LOW (ref >60)
Glucose, Bld: 126 mg/dL — ABNORMAL HIGH (ref 70–99)
Potassium: 5.1 mmol/L (ref 3.5–5.1)
Sodium: 137 mmol/L (ref 135–145)

## 2023-06-25 LAB — GASTROINTESTINAL PANEL BY PCR, STOOL (REPLACES STOOL CULTURE)

## 2023-06-25 LAB — URINALYSIS, COMPLETE (UACMP) WITH MICROSCOPIC
Bilirubin Urine: NEGATIVE
Glucose, UA: NEGATIVE mg/dL
Hgb urine dipstick: NEGATIVE
Ketones, ur: NEGATIVE mg/dL
Leukocytes,Ua: NEGATIVE
Nitrite: NEGATIVE
Protein, ur: 30 mg/dL — AB
Specific Gravity, Urine: 1.019 (ref 1.005–1.030)
pH: 5 (ref 5.0–8.0)

## 2023-06-25 LAB — C DIFFICILE QUICK SCREEN W PCR REFLEX
C Diff antigen: POSITIVE — AB
C Diff toxin: NEGATIVE

## 2023-06-25 LAB — MAGNESIUM: Magnesium: 2 mg/dL (ref 1.7–2.4)

## 2023-06-25 LAB — PHOSPHORUS: Phosphorus: 2.7 mg/dL (ref 2.5–4.6)

## 2023-06-25 LAB — CLOSTRIDIUM DIFFICILE BY PCR, REFLEXED: Toxigenic C. Difficile by PCR: POSITIVE — AB

## 2023-06-26 DIAGNOSIS — Z79621 Long term (current) use of calcineurin inhibitor: Secondary | ICD-10-CM | POA: Diagnosis not present

## 2023-06-26 DIAGNOSIS — E875 Hyperkalemia: Secondary | ICD-10-CM | POA: Diagnosis not present

## 2023-06-26 DIAGNOSIS — Z940 Kidney transplant status: Secondary | ICD-10-CM | POA: Diagnosis not present

## 2023-06-26 DIAGNOSIS — D849 Immunodeficiency, unspecified: Secondary | ICD-10-CM | POA: Diagnosis not present

## 2023-06-26 DIAGNOSIS — R109 Unspecified abdominal pain: Secondary | ICD-10-CM | POA: Diagnosis not present

## 2023-06-26 DIAGNOSIS — D84821 Immunodeficiency due to drugs: Secondary | ICD-10-CM | POA: Diagnosis not present

## 2023-06-26 LAB — URINE CULTURE: Culture: <10000 — AB

## 2023-06-26 LAB — CYTOMEGALOVIRUS DNA, QUANTITATIVE REAL-TIME PCR, PLASMA
CMV DNA Quant: NEGATIVE [IU]/mL
Log10 CMV Qn DNA Pl: UNDETERMINED {Log_IU}/mL

## 2023-06-26 LAB — BK QUANT PCR (PLASMA/SERUM): BK Quantitaion PCR: NEGATIVE [IU]/mL

## 2023-06-27 LAB — TACROLIMUS LEVEL: Tacrolimus (FK506) - LabCorp: 8.1 ng/mL (ref 5.0–20.0)

## 2023-07-01 DIAGNOSIS — N186 End stage renal disease: Secondary | ICD-10-CM | POA: Diagnosis not present

## 2023-07-01 DIAGNOSIS — Z940 Kidney transplant status: Secondary | ICD-10-CM | POA: Diagnosis not present

## 2023-07-01 DIAGNOSIS — M76891 Other specified enthesopathies of right lower limb, excluding foot: Secondary | ICD-10-CM | POA: Diagnosis not present

## 2023-07-01 DIAGNOSIS — D849 Immunodeficiency, unspecified: Secondary | ICD-10-CM | POA: Diagnosis not present

## 2023-07-04 DIAGNOSIS — G4733 Obstructive sleep apnea (adult) (pediatric): Secondary | ICD-10-CM | POA: Diagnosis not present

## 2023-07-08 DIAGNOSIS — E875 Hyperkalemia: Secondary | ICD-10-CM | POA: Diagnosis not present

## 2023-07-08 DIAGNOSIS — Z7960 Long term (current) use of unspecified immunomodulators and immunosuppressants: Secondary | ICD-10-CM | POA: Diagnosis not present

## 2023-07-08 DIAGNOSIS — R197 Diarrhea, unspecified: Secondary | ICD-10-CM | POA: Diagnosis not present

## 2023-07-08 DIAGNOSIS — I120 Hypertensive chronic kidney disease with stage 5 chronic kidney disease or end stage renal disease: Secondary | ICD-10-CM | POA: Diagnosis not present

## 2023-07-08 DIAGNOSIS — K31819 Angiodysplasia of stomach and duodenum without bleeding: Secondary | ICD-10-CM | POA: Diagnosis not present

## 2023-07-08 DIAGNOSIS — E1122 Type 2 diabetes mellitus with diabetic chronic kidney disease: Secondary | ICD-10-CM | POA: Diagnosis not present

## 2023-07-08 DIAGNOSIS — Z992 Dependence on renal dialysis: Secondary | ICD-10-CM | POA: Diagnosis not present

## 2023-07-08 DIAGNOSIS — Z79899 Other long term (current) drug therapy: Secondary | ICD-10-CM | POA: Diagnosis not present

## 2023-07-08 DIAGNOSIS — N186 End stage renal disease: Secondary | ICD-10-CM | POA: Diagnosis not present

## 2023-07-08 DIAGNOSIS — K8020 Calculus of gallbladder without cholecystitis without obstruction: Secondary | ICD-10-CM | POA: Diagnosis not present

## 2023-07-08 DIAGNOSIS — R251 Tremor, unspecified: Secondary | ICD-10-CM | POA: Diagnosis not present

## 2023-07-08 DIAGNOSIS — D649 Anemia, unspecified: Secondary | ICD-10-CM | POA: Diagnosis not present

## 2023-07-08 DIAGNOSIS — Z940 Kidney transplant status: Secondary | ICD-10-CM | POA: Diagnosis not present

## 2023-07-08 DIAGNOSIS — E785 Hyperlipidemia, unspecified: Secondary | ICD-10-CM | POA: Diagnosis not present

## 2023-07-08 DIAGNOSIS — E1140 Type 2 diabetes mellitus with diabetic neuropathy, unspecified: Secondary | ICD-10-CM | POA: Diagnosis not present

## 2023-07-08 DIAGNOSIS — L299 Pruritus, unspecified: Secondary | ICD-10-CM | POA: Diagnosis not present

## 2023-07-08 DIAGNOSIS — R0602 Shortness of breath: Secondary | ICD-10-CM | POA: Diagnosis not present

## 2023-07-08 DIAGNOSIS — T8619 Other complication of kidney transplant: Secondary | ICD-10-CM | POA: Diagnosis not present

## 2023-07-08 DIAGNOSIS — K3184 Gastroparesis: Secondary | ICD-10-CM | POA: Diagnosis not present

## 2023-07-08 DIAGNOSIS — I272 Pulmonary hypertension, unspecified: Secondary | ICD-10-CM | POA: Diagnosis not present

## 2023-07-08 DIAGNOSIS — D849 Immunodeficiency, unspecified: Secondary | ICD-10-CM | POA: Diagnosis not present

## 2023-07-08 DIAGNOSIS — N179 Acute kidney failure, unspecified: Secondary | ICD-10-CM | POA: Diagnosis not present

## 2023-07-08 DIAGNOSIS — E1142 Type 2 diabetes mellitus with diabetic polyneuropathy: Secondary | ICD-10-CM | POA: Diagnosis not present

## 2023-07-08 DIAGNOSIS — M109 Gout, unspecified: Secondary | ICD-10-CM | POA: Diagnosis not present

## 2023-07-08 DIAGNOSIS — N261 Atrophy of kidney (terminal): Secondary | ICD-10-CM | POA: Diagnosis not present

## 2023-07-08 DIAGNOSIS — K921 Melena: Secondary | ICD-10-CM | POA: Diagnosis not present

## 2023-07-08 DIAGNOSIS — K219 Gastro-esophageal reflux disease without esophagitis: Secondary | ICD-10-CM | POA: Diagnosis not present

## 2023-07-08 DIAGNOSIS — Z7982 Long term (current) use of aspirin: Secondary | ICD-10-CM | POA: Diagnosis not present

## 2023-07-08 DIAGNOSIS — Z792 Long term (current) use of antibiotics: Secondary | ICD-10-CM | POA: Diagnosis not present

## 2023-07-08 DIAGNOSIS — R109 Unspecified abdominal pain: Secondary | ICD-10-CM | POA: Diagnosis not present

## 2023-07-08 DIAGNOSIS — G894 Chronic pain syndrome: Secondary | ICD-10-CM | POA: Diagnosis not present

## 2023-07-09 DIAGNOSIS — Z940 Kidney transplant status: Secondary | ICD-10-CM | POA: Diagnosis not present

## 2023-07-09 DIAGNOSIS — N179 Acute kidney failure, unspecified: Secondary | ICD-10-CM | POA: Diagnosis not present

## 2023-07-09 DIAGNOSIS — K921 Melena: Secondary | ICD-10-CM | POA: Diagnosis not present

## 2023-07-09 DIAGNOSIS — E875 Hyperkalemia: Secondary | ICD-10-CM | POA: Diagnosis not present

## 2023-07-09 DIAGNOSIS — R197 Diarrhea, unspecified: Secondary | ICD-10-CM | POA: Diagnosis not present

## 2023-07-11 DIAGNOSIS — K219 Gastro-esophageal reflux disease without esophagitis: Secondary | ICD-10-CM | POA: Diagnosis not present

## 2023-07-11 DIAGNOSIS — E875 Hyperkalemia: Secondary | ICD-10-CM | POA: Diagnosis not present

## 2023-07-11 DIAGNOSIS — N179 Acute kidney failure, unspecified: Secondary | ICD-10-CM | POA: Diagnosis not present

## 2023-07-11 DIAGNOSIS — Z940 Kidney transplant status: Secondary | ICD-10-CM | POA: Diagnosis not present

## 2023-07-11 DIAGNOSIS — K921 Melena: Secondary | ICD-10-CM | POA: Diagnosis not present

## 2023-07-12 NOTE — Therapy (Incomplete)
 [facility] PH[facility]ERAPY LOWER EXTREMITY EVALUATION   [name] MRN: [id number] DOB:[birth year], 48 y.o., female Today's Date: 07/14/2023  END OF SESSION:  PT End of Session - 07/13/23 1058     Visit Number 1    Number of Visits 13    Date for PT Re-Evaluation 09/03/23    Authorization Type [employer] DUAL COMPLETE    Authorization - Visit Number 1    Authorization - Number of Visits 27    PT Start Time 0940    PT Stop Time 1030    PT Time Calculation (min) 50 min    Activity Tolerance Patient limited by pain    Behavior During Therapy [facility] for tasks assessed/performed          Past Medical History:  Diagnosis Date   Acid reflux    Anemia 04/2019   REQUIRING BLOOD TRANSFUSIONS   Anxiety    Arthritis    CHF (congestive heart failure) (HCC)    Dyspnea    with exertion   Dysrhythmia    tachycardia   ESRD (end stage renal disease) (HCC)    [facility] [place]   Facial twitching 11/2020   mouth twitch, eyes roll back, neurology started patient on low dose   Sinemet  IR.   GI bleeding 12/2017   Gout    Headache(784.0)    related to chemo; sometimes weekly (09/12/2013)   Heart murmur    Systolic per Dr Salena Negri' notes   High cholesterol    History of blood transfusion a couple   related to low counts   Hypertension    Iron  deficiency anemia    get epogen shots q month (02/20/2014)   MCGN (minimal change glomerulonephritis)    using chemo to tx;  finished my last tx in 12/2013   Neuropathy    Peritoneal dialysis status (HCC)    Stroke (HCC) 08/15/2017   ruled out that it was not a stroke   Past Surgical History:  Procedure Laterality Date   ABDOMINAL HYSTERECTOMY  2010   laparoscopic   ANKLE FRACTURE SURGERY Right 1994   AV FISTULA PLACEMENT Left 04/14/2016   Procedure: ARTERIOVENOUS (AV) FISTULA CREATION;  Surgeon: Lonni GORMAN Blade, MD;  Location: [facility] OR;  Service: Vascular;  Laterality: Left;   AV FISTULA  PLACEMENT Left 08/09/2017   Procedure: INSERTION OF ARTERIOVENOUS (AV) GORE-TEX GRAFT LEFT ARM;  Surgeon: Harvey Carlin BRAVO, MD;  Location: [facility] OR;  Service: Vascular;  Laterality: Left;   AV FISTULA PLACEMENT Right 03/15/2020   Procedure: RIGHT ARM ARTERIOVENOUS (AV) FISTULA CREATION 1st Stage Basilic Transposition;  Surgeon: Serene Gaile ORN, MD;  Location: [facility] OR;  Service: Vascular;  Laterality: Right;   AV FISTULA PLACEMENT Right 02/24/2021   Procedure: RIGHT ARM ARTERIOVENOUS  FISTULA CREATION;  Surgeon: Lanis Fonda BRAVO, MD;  Location: [facility] OR;  Service: Vascular;  Laterality: Right;  regional block   AVGG REMOVAL Left 08/11/2017   Procedure: REMOVAL OF ARTERIOVENOUS GORETEX GRAFT (AVGG);  Surgeon: Serene Gaile ORN, MD;  Location: [facility] OR;  Service: Vascular;  Laterality: Left;   BASCILIC VEIN TRANSPOSITION Right 05/17/2020   Procedure: RIGHT UPPER EXTREMITY SECOND STAGE BASCILIC VEIN TRANSPOSITION;  Surgeon: Serene Gaile ORN, MD;  Location: [facility] OR;  Service: Vascular;  Laterality: Right;   BIOPSY  09/03/2017   Procedure: BIOPSY;  Surgeon: Elicia Claw, MD;  Location: [facility] ENDOSCOPY;  Service: Gastroenterology;;   BIOPSY  12/24/2017   Procedure: BIOPSY;  Surgeon: Wilhelmenia Aloha Raddle., MD;  Location:  [facility] ENDOSCOPY;  Service: Gastroenterology;;   CARDIAC CATHETERIZATION  2000's   COLONOSCOPY N/A 10/22/2022   Procedure: COLONOSCOPY;  Surgeon: Maryruth Ole DASEN, MD;  Location: [facility] ENDOSCOPY;  Service: Endoscopy;  Laterality: N/A;   COLONOSCOPY WITH PROPOFOL  N/A 09/04/2017   Procedure: COLONOSCOPY WITH PROPOFOL ;  Surgeon: Saintclair Jasper, MD;  Location: [facility] ENDOSCOPY;  Service: Gastroenterology;  Laterality: N/A;   ENTEROSCOPY N/A 12/24/2017   Procedure: ENTEROSCOPY;  Surgeon: Wilhelmenia Aloha Raddle., MD;  Location: [facility] ENDOSCOPY;  Service: Gastroenterology;  Laterality: N/A;   ESOPHAGOGASTRODUODENOSCOPY     ESOPHAGOGASTRODUODENOSCOPY (EGD) WITH PROPOFOL  Left 06/04/2017   Procedure:  ESOPHAGOGASTRODUODENOSCOPY (EGD) WITH PROPOFOL ;  Surgeon: Burnette Fallow, MD;  Location: [facility] ENDOSCOPY;  Service: Endoscopy;  Laterality: Left;   ESOPHAGOGASTRODUODENOSCOPY (EGD) WITH PROPOFOL  N/A 09/03/2017   Procedure: ESOPHAGOGASTRODUODENOSCOPY (EGD) WITH PROPOFOL ;  Surgeon: Elicia Claw, MD;  Location: [facility] ENDOSCOPY;  Service: Gastroenterology;  Laterality: N/A;   ESOPHAGOGASTRODUODENOSCOPY (EGD) WITH PROPOFOL  N/A 04/27/2022   Procedure: ESOPHAGOGASTRODUODENOSCOPY (EGD) WITH PROPOFOL ;  Surgeon: Maryruth Ole DASEN, MD;  Location: [facility] ENDOSCOPY;  Service: Endoscopy;  Laterality: N/A;   FISTULA SUPERFICIALIZATION Left 04/02/2017   Procedure: FISTULA SUPERFICIALIZATION LEFT ARM;  Surgeon: Serene Gaile ORN, MD;  Location: [facility] OR;  Service: Vascular;  Laterality: Left;   FISTULA SUPERFICIALIZATION Right 04/14/2021   Procedure: RIGHT ARM FISTULA SUPERFICIALIZATION;  Surgeon: Lanis Fonda BRAVO, MD;  Location: [facility] OR;  Service: Vascular;  Laterality: Right;  PERIPHERAL NERVE BLOCK   FISTULOGRAM Left 04/07/2016   Procedure: FISTULOGRAM;  Surgeon: Penne Lonni Colorado, MD;  Location: [facility] OR;  Service: Vascular;  Laterality: Left;   FRACTURE SURGERY     right ankle   GIVENS CAPSULE STUDY N/A 10/29/2017   Procedure: GIVENS CAPSULE STUDY;  Surgeon: Dianna Specking, MD;  Location: [facility] ENDOSCOPY;  Service: Endoscopy;  Laterality: N/A;   GIVENS CAPSULE STUDY N/A 12/24/2017   Procedure: GIVENS CAPSULE STUDY;  Surgeon: Wilhelmenia Aloha Raddle., MD;  Location: [facility] ENDOSCOPY;  Service: Gastroenterology;  Laterality: N/A;   GIVENS CAPSULE STUDY N/A 05/22/2018   Procedure: GIVENS CAPSULE STUDY;  Surgeon: Wilhelmenia Aloha Raddle., MD;  Location: [facility] ENDOSCOPY;  Service: Gastroenterology;  Laterality: N/A;   INSERTION OF DIALYSIS CATHETER Right 04/07/2016   Procedure: INSERTION OF DIALYSIS CATHETER;  Surgeon: Penne Lonni [place], MD;  Location: [facility] OR;  Service: Vascular;  Laterality: Right;   INSERTION OF DIALYSIS  CATHETER Right 04/04/2017   Procedure: INSERTION OF DIALYSIS CATHETER;  Surgeon: [place] Penne Lonni, MD;  Location: [facility] OR;  Service: Vascular;  Laterality: Right;   IR FLUORO GUIDE CV LINE LEFT  03/13/2020   IR US  GUIDE VASC ACCESS LEFT  03/13/2020   LEFT HEART CATH AND CORONARY ANGIOGRAPHY N/A 05/12/2019   Procedure: LEFT HEART CATH AND CORONARY ANGIOGRAPHY;  Surgeon: Claudene Pacific, MD;  Location: [facility] INVASIVE CV LAB;  Service: Cardiovascular;  Laterality: N/A;   LIGATION OF ARTERIOVENOUS  FISTULA Left 04/14/2016   Procedure: LIGATION OF ARTERIOVENOUS  FISTULA;  Surgeon: Lonni GORMAN Blade, MD;  Location: [facility] OR;  Service: Vascular;  Laterality: Left;   LIGATION OF COMPETING BRANCHES OF ARTERIOVENOUS FISTULA Right 05/17/2020   Procedure: LIGATION OF COMPETING BRANCHES OF RIGHT ARM ARTERIOVENOUS FISTULA;  Surgeon: Serene Gaile ORN, MD;  Location: [facility] OR;  Service: Vascular;  Laterality: Right;   PATCH ANGIOPLASTY Left 06/19/2016   Procedure: PATCH ANGIOPLASTY LEFT ARTERIOVENOUS FISTULA;  Surgeon: Blade Lonni GORMAN, MD;  Location: [facility] OR;  Service: Vascular;  Laterality: Left;   POLYPECTOMY  09/04/2017   Procedure: POLYPECTOMY;  Surgeon: Saintclair Jasper, MD;  Location: [facility] ENDOSCOPY;  Service: Gastroenterology;;   REVISON OF ARTERIOVENOUS FISTULA Left 06/19/2016   Procedure: REVISION SUPERFICIALIZATION OF BRACHIOCEPHALIC ARTERIOVENOUS FISTULA;  Surgeon: Eliza Lonni RAMAN, MD;  Location: [facility] OR;  Service: Vascular;  Laterality: Left;   UPPER EXTREMITY VENOGRAPHY Bilateral 02/19/2021   Procedure: UPPER EXTREMITY VENOGRAPHY;  Surgeon: Lanis Fonda BRAVO, MD;  Location: [facility] INVASIVE CV LAB;  Service: Cardiovascular;  Laterality: Bilateral;   Patient Active Problem List   Diagnosis Date Noted   Gastroparesis 12/31/2022   OSA on CPAP 12/31/2022   Rectal bleeding 10/23/2022   Acute on chronic anemia 10/19/2022   Obesity (BMI 30-39.9) 08/25/2022   Acute encephalopathy 08/24/2022   (HFpEF) heart failure  with preserved ejection fraction (HCC) 08/24/2022   Acute respiratory distress 08/11/2022   Pneumonia due to COVID-19 virus 08/10/2022   Facial twitching 08/09/2022   CAP (community acquired pneumonia) 07/21/2022   Acute pulmonary edema (HCC) 07/20/2022   Hypervolemia associated with renal insufficiency 05/11/2022   Hyperkalemia 05/11/2022   Restless leg syndrome 04/23/2022   Anaphylactic shock, unspecified, initial encounter 09/07/2021   Gout 08/21/2021   GERD without esophagitis 08/21/2021   Anxiety and depression 08/21/2021   Neuropathic pain 05/26/2021   Dystonia 01/13/2021   Polyneuropathy associated with underlying disease (HCC) 01/13/2021   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   Other specified complication of vascular prosthetic devices, implants and grafts, initial encounter (HCC) 09/03/2020   Contact with and (suspected) exposure to covid-19 08/13/2020   Altered mental status, unspecified 06/25/2020   Other irregular eye movements 06/25/2020   PD catheter dysfunction (HCC) 04/26/2020   Other disorders of phosphorus metabolism 04/17/2020   Hypercalcemia 04/15/2020   Impacted cerumen of both ears 04/05/2020   Chronic gouty arthritis 04/05/2020   GAD (generalized anxiety disorder) 04/05/2020   History of hypertension 04/05/2020   Peripheral neuropathy 04/05/2020   Primary insomnia 04/05/2020   Malnutrition of moderate degree 03/13/2020   Hypertensive urgency 03/11/2020   Myoclonia 03/11/2020   Abscess of vulva 02/19/2020   Pericolonic abscess 02/06/2020   Chest pain 05/10/2019   Anemia 04/25/2019   SVT (supraventricular tachycardia) (HCC) 04/25/2019   Elevated troponin 04/25/2019   ESRD on dialysis (HCC) 04/25/2019   Bacterial infection, unspecified 04/17/2019   Pain, unspecified 04/17/2019   Pruritus, unspecified 04/17/2019   Benign paroxysmal positional vertigo of left ear 08/17/2018   Acute on chronic blood loss anemia 05/21/2018   Lower GI bleed 05/20/2018   Symptomatic  anemia 10/28/2017   Acidosis 05/24/2017   Chills (without fever) 05/24/2017   Coagulation defect, unspecified (HCC) 05/24/2017   Dependence on renal dialysis (HCC) 05/24/2017   Encounter for fitting and adjustment of extracorporeal dialysis catheter (HCC) 05/24/2017   Fluid overload, unspecified 05/24/2017   Iron  deficiency anemia, unspecified 05/24/2017   Liver disease, unspecified 05/24/2017   Other disorders of electrolyte and fluid balance, not elsewhere classified 05/24/2017   Other disorders resulting from impaired renal tubular function 05/24/2017   Other long term (current) drug therapy 05/24/2017   Secondary hyperparathyroidism of renal origin (HCC) 05/24/2017   Unspecified abnormal findings in urine 05/24/2017   Unspecified jaundice 05/24/2017   Chronic kidney disease 05/24/2017   Type 2 diabetes mellitus with other diabetic kidney complication (HCC) 05/24/2017   ESRD on peritoneal dialysis (HCC) 04/01/2016   Anemia due to chronic kidney disease 04/01/2016   GERD (gastroesophageal reflux disease) 04/01/2016   Atypical chest pain    Nephrotic syndrome 10/02/2013   Dyslipidemia 10/02/2013   PAT (paroxysmal  atrial tachycardia) (HCC) 09/29/2013   Normocytic anemia 06/29/2013   Glomerulonephritis 01/28/2013   Dyspnea 07/04/2011   Morbid obesity (HCC) 07/04/2011   Essential hypertension 07/04/2011   Non-insulin  dependent type 2 diabetes mellitus (HCC) 07/04/2011   Hypercholesterolemia 07/04/2011    PCP: Jesus Griffes, MD   REFERRING PROVIDER: Jesus Griffes, MD   REFERRING DIAG: [phone] (ICD-10-CM) - Other specified enthesopathies of right lower limb, excluding foot   THERAPY DIAG:  Pain in right leg  Difficulty in walking, not elsewhere classified  Localized edema  Rationale for Evaluation and Treatment: Rehabilitation  ONSET DATE: 01/31/23  SUBJECTIVE:   SUBJECTIVE STATEMENT: Pt reports she has chronic R leg pain, more lateral and on the front and  back of her R thigh. She experiences burning, swelling and sharp pain. Pt also has minimal low back pain on the R and neuropathy of both feet. 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  PERTINENT HISTORY: Arthritis, CHF, anxiety, ESRD, DM2, neuropathy, high BMI  PAIN:  Are you having pain? Yes: NPRS scale: currently 6/10. Pain range on eval 6-10/10 Pain location: on the lateral ant and post aspects of the R leg Pain description: Burning, sharp, swelling Aggravating factors: Prolonged time on feet, lying on R side Relieving factors: Massager, rest  PRECAUTIONS: None  RED FLAGS: None   WEIGHT BEARING RESTRICTIONS: No  FALLS:  Has patient fallen in last 6 months? No  LIVING ENVIRONMENT: Lives with: lives with their family Lives in: House/apartment Stairs: No Able to access home  OCCUPATION: Disability  PLOF: Independent  PATIENT GOALS: Pain relief  NEXT MD VISIT: 07/1023  OBJECTIVE:  Note: Objective measures were completed at Evaluation unless otherwise noted.  DIAGNOSTIC FINDINGS:   MRI Findings: There is focal fat deposition/small intramuscular lipoma within the proximal right adductor magnus (7:29). Mild fatty atrophy of the visualized right thigh musculature. Insertional gluteus minimus and medius tendinosis. Right common hamstring origin tendinosis. Mild intramuscular edema within the quadratus femoris with narrowed ischiofemoral interval. Tendinosis of the distal obturator externus. The hip and knee are approximated on the large field-of-view images. Degenerative subchondral signal changes with cartilage thinning at the knee. Trace right knee effusion. The neurovascular bundles are normal in course and contour. There is mild subcutaneous edema in the superficial soft tissues of the lateral right hip. No suspicious enhancing soft tissue or osseous lesion within the right thigh. No rim-enhancing fluid collection. Partially visualized postsurgical  changes in the right pelvis.   PATIENT SURVEYS:  LEFS: 31/80= 39%  COGNITION: Overall cognitive status: Within functional limits for tasks assessed     SENSATION: WFL  EDEMA:  Swelling of the R thigh  MUSCLE LENGTH: Hamstrings: Right limited due to pain deg; Left WNLs deg Debby test: Right WNLs deg; Left WNLs deg  POSTURE: rounded shoulders, forward head, and flexed trunk   PALPATION: Markedly TTP to the Ant/lat R thigh, R greater trochanter area, post. thigh T LOWER EXTREMITY ROM:  Active ROM Right eval Left eval  Hip flexion 70 pain   Hip extension    Hip abduction 25 pain   Hip adduction    Hip internal rotation    Hip external rotation 20 pain   Knee flexion    Knee extension    Ankle dorsiflexion    Ankle plantarflexion    Ankle inversion    Ankle eversion     (Blank rows = not tested)  LOWER EXTREMITY MMT:  All MMT for the R leg were limited  by pain MMT Right eval Left eval  Hip flexion 2   Hip extension 2   Hip abduction 2   Hip adduction 2   Hip internal rotation 2   Hip external rotation 2   Knee flexion 3   Knee extension 3   Ankle dorsiflexion 3   Ankle plantarflexion 2   Ankle inversion    Ankle eversion     (Blank rows = not tested)  LOWER EXTREMITY SPECIAL TESTS:  NT  FUNCTIONAL TESTS:  5 times sit to stand: TBA 2 minute walk test: TBA  GAIT: Distance walked: 158ft Assistive device utilized: None Level of assistance: Complete Independence Comments: Antalgic gait pattern                                                                                                                                TREATMENT DATE:    [facility] Adult PT Treatment:                                                DATE: 07/13/23 Therapeutic Exercise: Developed, instructed in, and pt completed therex as noted in HEP  Self Care: Elevation and ice packs for pain and swelling   PATIENT EDUCATION:  Education details: Eval findings, POC, HEP, self care   Person educated: Patient Education method: Explanation, Demonstration, Tactile cues, Verbal cues, and Handouts Education comprehension: verbalized understanding, returned demonstration, verbal cues required, and tactile cues required  HOME EXERCISE PROGRAM: Access Code: [email or web address] URL: [email or web address] Date: 07/13/2023 Prepared by: Dasie Daft  Exercises - Supine Quad Set  - 1 x daily - 7 x weekly - 1 sets - 10 reps - 5 hold - Supine Heel Slides  - 1 x daily - 7 x weekly - 1 sets - 10 reps - 3 hold - Supine Isometric Hamstring Set  - 1 x daily - 7 x weekly - 1 sets - 10 reps - 5 hold - Supine Hip Abduction with Resistance at Ankles  - 1 x daily - 7 x weekly - 1 sets - 10 reps - 5 hold  ASSESSMENT:  CLINICAL IMPRESSION: Patient is a 48 y.o. female who was seen today for physical therapy evaluation and treatment for M76.891 (ICD-10-CM) - Other specified enthesopathies of right lower limb, excluding foot. Pt presents to PT with decreased R LE AROM, strength, and function consistent with the referring Dx. Per LEFS, pt's rates the functional use of the R LE as low, 39%. Pt will benefit from skilled PT to address impairments to optimize function with less pain.  OBJECTIVE IMPAIRMENTS: decreased activity tolerance, decreased mobility, difficulty walking, decreased ROM, decreased strength, increased edema, obesity, and pain.   ACTIVITY LIMITATIONS: carrying, lifting, bending, sitting, standing, squatting, sleeping, stairs, transfers, bed mobility, bathing, toileting, dressing, locomotion level, and caring for others  PARTICIPATION LIMITATIONS: meal prep, cleaning, laundry, driving, shopping, and community activity  PERSONAL FACTORS: Fitness, Past/current experiences, Time since onset of injury/illness/exacerbation, and 3+ comorbidities: Arthritis, CHF, anxiety, ESRD, DM2, neuropathy, high BMI are also affecting patient's functional outcome.   REHAB POTENTIAL: Good  CLINICAL  DECISION MAKING: Evolving/moderate complexity  EVALUATION COMPLEXITY: Moderate   GOALS:   SHORT TERM GOALS: Target date: 07/30/23 Pt will be Ind in an initial HEP  Baseline: started Goal status: INITIAL  2.  Pt will voice understanding of measures to assist in pain reduction  Baseline: started Goal status: INITIAL  3.  Increase R hip flexion AROM to 90d for improved function and as indication of improved pain Baseline:  Goal status: INITIAL  LONG TERM GOALS: Target date: 09/03/23  Pt will be Ind in a final HEP to maintain achieved LOF  Baseline:  Goal status: INITIAL  2.  Increased R hip AROM to flexion 100d, ER 35d, and abd to 40d for improved function with a normalized gait pattern and as indication of improved pain. Baseline: 70, 20, and 25 respectively Goal status: INITIAL  3.  Increase R hip strength to 3/5 and knee strength to 4/5 for improved functional use of the R LE with a normalized gait pattern Baseline:  Goal status: INITIAL  4.  Improve 5xSTS by MCID of 5 and by MCID of 26ft as indication of improved functional mobility  Baseline: TBA Goal status: INITIAL  5.  Pt's LEFS score will improve by the MCID to 54% as indication of improved function  Baseline: 39% Goal status: INITIAL   PLAN:  PT FREQUENCY: 2x/week  PT DURATION: 6 weeks  PLANNED INTERVENTIONS: 97164- PT Re-evaluation, 97110-Therapeutic exercises, 97530- Therapeutic activity, 97112- Neuromuscular re-education, 97535- Self Care, 02859- Manual therapy, [id number]- Gait training, [phone]- Aquatic Therapy, [phone]- Electrical stimulation (unattended), 97016- Vasopneumatic device, 20560 (1-2 muscles), 20561 (3+ muscles)- Dry Needling, Patient/Family education, Balance training, Stair training, Taping, Joint mobilization, Cryotherapy, and Moist heat  PLAN FOR NEXT SESSION: Assess 5xSTS and ; assess response to HEP; progress therex as indicated; use of modalities, manual therapy; and TPDN as  indicated.   Gargi Berch MS, PT 07/14/23 5:47 AM  Dasie Daft MS, PT 07/14/23 1:41 PM

## 2023-07-13 ENCOUNTER — Ambulatory Visit: Admitting: Physical Therapy

## 2023-07-13 ENCOUNTER — Other Ambulatory Visit: Payer: Self-pay

## 2023-07-13 ENCOUNTER — Ambulatory Visit: Attending: Internal Medicine

## 2023-07-13 DIAGNOSIS — R600 Localized edema: Secondary | ICD-10-CM | POA: Diagnosis not present

## 2023-07-13 DIAGNOSIS — M79604 Pain in right leg: Secondary | ICD-10-CM | POA: Insufficient documentation

## 2023-07-13 DIAGNOSIS — R262 Difficulty in walking, not elsewhere classified: Secondary | ICD-10-CM | POA: Diagnosis not present

## 2023-07-14 NOTE — Addendum Note (Signed)
 Addended by: JOLINDA DAWN on: 07/14/2023 01:45 PM   Modules accepted: Orders

## 2023-07-15 ENCOUNTER — Ambulatory Visit: Admitting: Physical Therapy

## 2023-07-15 ENCOUNTER — Encounter: Payer: Self-pay | Admitting: Physical Therapy

## 2023-07-15 DIAGNOSIS — R262 Difficulty in walking, not elsewhere classified: Secondary | ICD-10-CM

## 2023-07-15 DIAGNOSIS — M79604 Pain in right leg: Secondary | ICD-10-CM

## 2023-07-15 DIAGNOSIS — R600 Localized edema: Secondary | ICD-10-CM | POA: Diagnosis not present

## 2023-07-15 NOTE — Therapy (Signed)
 [facility] LOWER EXTREMITY TREATMENT    [name] MRN: [id number] DOB:09-06-1975, 48 y.o., female Today's Date: 07/15/2023  END OF SESSION:  PT End of Session - 07/15/23 1105     Visit Number 2    Number of Visits 13    Date for PT Re-Evaluation 09/03/23    Authorization Type [employer] DUAL COMPLETE    Authorization - Visit Number 2    Authorization - Number of Visits 27    PT Start Time 1102    PT Stop Time 1147    PT Time Calculation (min) 45 min          Past Medical History:  Diagnosis Date   Acid reflux    Anemia 04/2019   REQUIRING BLOOD TRANSFUSIONS   Anxiety    Arthritis    CHF (congestive heart failure) (HCC)    Dyspnea    with exertion   Dysrhythmia    tachycardia   ESRD (end stage renal disease) (HCC)    [facility] [place]   Facial twitching 11/2020   mouth twitch, eyes roll back, neurology started patient on low dose   Sinemet  IR.   GI bleeding 12/2017   Gout    Headache(784.0)    related to chemo; sometimes weekly (09/12/2013)   Heart murmur    Systolic per Dr Salena Negri' notes   High cholesterol    History of blood transfusion a couple   related to low counts   Hypertension    Iron  deficiency anemia    get epogen shots q month (02/20/2014)   MCGN (minimal change glomerulonephritis)    using chemo to tx;  finished my last tx in 12/2013   Neuropathy    Peritoneal dialysis status (HCC)    Stroke (HCC) 08/15/2017   ruled out that it was not a stroke   Past Surgical History:  Procedure Laterality Date   ABDOMINAL HYSTERECTOMY  2010   laparoscopic   ANKLE FRACTURE SURGERY Right 1994   AV FISTULA PLACEMENT Left 04/14/2016   Procedure: ARTERIOVENOUS (AV) FISTULA CREATION;  Surgeon: Lonni GORMAN Blade, MD;  Location: [facility] OR;  Service: Vascular;  Laterality: Left;   AV FISTULA PLACEMENT Left 08/09/2017   Procedure: INSERTION OF ARTERIOVENOUS (AV) GORE-TEX GRAFT LEFT ARM;  Surgeon:  Harvey Carlin BRAVO, MD;  Location: [facility] OR;  Service: Vascular;  Laterality: Left;   AV FISTULA PLACEMENT Right 03/15/2020   Procedure: RIGHT ARM ARTERIOVENOUS (AV) FISTULA CREATION 1st Stage Basilic Transposition;  Surgeon: Serene Gaile ORN, MD;  Location: [facility] OR;  Service: Vascular;  Laterality: Right;   AV FISTULA PLACEMENT Right 02/24/2021   Procedure: RIGHT ARM ARTERIOVENOUS  FISTULA CREATION;  Surgeon: Lanis Fonda BRAVO, MD;  Location: [facility] OR;  Service: Vascular;  Laterality: Right;  regional block   AVGG REMOVAL Left 08/11/2017   Procedure: REMOVAL OF ARTERIOVENOUS GORETEX GRAFT (AVGG);  Surgeon: Serene Gaile ORN, MD;  Location: [facility] OR;  Service: Vascular;  Laterality: Left;   BASCILIC VEIN TRANSPOSITION Right 05/17/2020   Procedure: RIGHT UPPER EXTREMITY SECOND STAGE BASCILIC VEIN TRANSPOSITION;  Surgeon: Serene Gaile ORN, MD;  Location: [facility] OR;  Service: Vascular;  Laterality: Right;   BIOPSY  09/03/2017   Procedure: BIOPSY;  Surgeon: Elicia Claw, MD;  Location: [facility] ENDOSCOPY;  Service: Gastroenterology;;   BIOPSY  12/24/2017   Procedure: BIOPSY;  Surgeon: Wilhelmenia Aloha Raddle., MD;  Location: [facility] ENDOSCOPY;  Service: Gastroenterology;;   CARDIAC CATHETERIZATION  2000's   COLONOSCOPY N/A 10/22/2022  Procedure: COLONOSCOPY;  Surgeon: Maryruth Ole DASEN, MD;  Location: [facility] ENDOSCOPY;  Service: Endoscopy;  Laterality: N/A;   COLONOSCOPY WITH PROPOFOL  N/A 09/04/2017   Procedure: COLONOSCOPY WITH PROPOFOL ;  Surgeon: Saintclair Jasper, MD;  Location: [facility] ENDOSCOPY;  Service: Gastroenterology;  Laterality: N/A;   ENTEROSCOPY N/A 12/24/2017   Procedure: ENTEROSCOPY;  Surgeon: Wilhelmenia Aloha Raddle., MD;  Location: [facility] ENDOSCOPY;  Service: Gastroenterology;  Laterality: N/A;   ESOPHAGOGASTRODUODENOSCOPY     ESOPHAGOGASTRODUODENOSCOPY (EGD) WITH PROPOFOL  Left 06/04/2017   Procedure: ESOPHAGOGASTRODUODENOSCOPY (EGD) WITH PROPOFOL ;  Surgeon: Burnette Fallow, MD;  Location: [facility] ENDOSCOPY;  Service: Endoscopy;   Laterality: Left;   ESOPHAGOGASTRODUODENOSCOPY (EGD) WITH PROPOFOL  N/A 09/03/2017   Procedure: ESOPHAGOGASTRODUODENOSCOPY (EGD) WITH PROPOFOL ;  Surgeon: Elicia Claw, MD;  Location: [facility] ENDOSCOPY;  Service: Gastroenterology;  Laterality: N/A;   ESOPHAGOGASTRODUODENOSCOPY (EGD) WITH PROPOFOL  N/A 04/27/2022   Procedure: ESOPHAGOGASTRODUODENOSCOPY (EGD) WITH PROPOFOL ;  Surgeon: Maryruth Ole DASEN, MD;  Location: [facility] ENDOSCOPY;  Service: Endoscopy;  Laterality: N/A;   FISTULA SUPERFICIALIZATION Left 04/02/2017   Procedure: FISTULA SUPERFICIALIZATION LEFT ARM;  Surgeon: Serene Gaile ORN, MD;  Location: [facility] OR;  Service: Vascular;  Laterality: Left;   FISTULA SUPERFICIALIZATION Right 04/14/2021   Procedure: RIGHT ARM FISTULA SUPERFICIALIZATION;  Surgeon: Lanis Fonda BRAVO, MD;  Location: [facility] OR;  Service: Vascular;  Laterality: Right;  PERIPHERAL NERVE BLOCK   FISTULOGRAM Left 04/07/2016   Procedure: FISTULOGRAM;  Surgeon: Penne Lonni Colorado, MD;  Location: [facility] OR;  Service: Vascular;  Laterality: Left;   FRACTURE SURGERY     right ankle   GIVENS CAPSULE STUDY N/A 10/29/2017   Procedure: GIVENS CAPSULE STUDY;  Surgeon: Dianna Specking, MD;  Location: [facility] ENDOSCOPY;  Service: Endoscopy;  Laterality: N/A;   GIVENS CAPSULE STUDY N/A 12/24/2017   Procedure: GIVENS CAPSULE STUDY;  Surgeon: Wilhelmenia Aloha Raddle., MD;  Location: [facility] ENDOSCOPY;  Service: Gastroenterology;  Laterality: N/A;   GIVENS CAPSULE STUDY N/A 05/22/2018   Procedure: GIVENS CAPSULE STUDY;  Surgeon: Wilhelmenia Aloha Raddle., MD;  Location: [facility] ENDOSCOPY;  Service: Gastroenterology;  Laterality: N/A;   INSERTION OF DIALYSIS CATHETER Right 04/07/2016   Procedure: INSERTION OF DIALYSIS CATHETER;  Surgeon: Penne Lonni Colorado, MD;  Location: [facility] OR;  Service: Vascular;  Laterality: Right;   INSERTION OF DIALYSIS CATHETER Right 04/04/2017   Procedure: INSERTION OF DIALYSIS CATHETER;  Surgeon: [place] Penne Lonni, MD;  Location: [facility]  OR;  Service: Vascular;  Laterality: Right;   IR FLUORO GUIDE CV LINE LEFT  03/13/2020   IR US  GUIDE VASC ACCESS LEFT  03/13/2020   LEFT HEART CATH AND CORONARY ANGIOGRAPHY N/A 05/12/2019   Procedure: LEFT HEART CATH AND CORONARY ANGIOGRAPHY;  Surgeon: Claudene Pacific, MD;  [facility]: [facility] INVASIVE CV LAB;  Service: Cardiovascular;  Laterality: N/A;   LIGATION OF ARTERIOVENOUS  FISTULA Left 04/14/2016   Procedure: LIGATION OF ARTERIOVENOUS  FISTULA;  Surgeon: Lonni GORMAN Blade, MD;  Location: [facility] OR;  Service: Vascular;  Laterality: Left;   LIGATION OF COMPETING BRANCHES OF ARTERIOVENOUS FISTULA Right 05/17/2020   Procedure: LIGATION OF COMPETING BRANCHES OF RIGHT ARM ARTERIOVENOUS FISTULA;  Surgeon: Serene Gaile ORN, MD;  Location: [facility] OR;  Service: Vascular;  Laterality: Right;   PATCH ANGIOPLASTY Left 06/19/2016   Procedure: PATCH ANGIOPLASTY LEFT ARTERIOVENOUS FISTULA;  Surgeon: Blade Lonni GORMAN, MD;  Location: [facility] OR;  Service: Vascular;  Laterality: Left;   POLYPECTOMY  09/04/2017   Procedure: POLYPECTOMY;  Surgeon: Saintclair Jasper, MD;  Location: [facility] ENDOSCOPY;  Service: Gastroenterology;;   REVISON OF ARTERIOVENOUS FISTULA Left  06/19/2016   Procedure: REVISION SUPERFICIALIZATION OF BRACHIOCEPHALIC ARTERIOVENOUS FISTULA;  Surgeon: Eliza Lonni RAMAN, MD;  Location: [facility] OR;  Service: Vascular;  Laterality: Left;   UPPER EXTREMITY VENOGRAPHY Bilateral 02/19/2021   Procedure: UPPER EXTREMITY VENOGRAPHY;  Surgeon: Lanis Fonda BRAVO, MD;  Location: [facility] INVASIVE CV LAB;  Service: Cardiovascular;  Laterality: Bilateral;   Patient Active Problem List   Diagnosis Date Noted   Gastroparesis 12/31/2022   OSA on CPAP 12/31/2022   Rectal bleeding 10/23/2022   Acute on chronic anemia 10/19/2022   Obesity (BMI 30-39.9) 08/25/2022   Acute encephalopathy 08/24/2022   (HFpEF) heart failure with preserved ejection fraction (HCC) 08/24/2022   Acute respiratory distress 08/11/2022   Pneumonia due to COVID-19 virus  08/10/2022   Facial twitching 08/09/2022   CAP (community acquired pneumonia) 07/21/2022   Acute pulmonary edema (HCC) 07/20/2022   Hypervolemia associated with renal insufficiency 05/11/2022   Hyperkalemia 05/11/2022   Restless leg syndrome 04/23/2022   Anaphylactic shock, unspecified, initial encounter 09/07/2021   Gout 08/21/2021   GERD without esophagitis 08/21/2021   Anxiety and depression 08/21/2021   Neuropathic pain 05/26/2021   Dystonia 01/13/2021   Polyneuropathy associated with underlying disease (HCC) 01/13/2021   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   Other specified complication of vascular prosthetic devices, implants and grafts, initial encounter (HCC) 09/03/2020   Contact with and (suspected) exposure to covid-19 08/13/2020   Altered mental status, unspecified 06/25/2020   Other irregular eye movements 06/25/2020   PD catheter dysfunction (HCC) 04/26/2020   Other disorders of phosphorus metabolism 04/17/2020   Hypercalcemia 04/15/2020   Impacted cerumen of both ears 04/05/2020   Chronic gouty arthritis 04/05/2020   GAD (generalized anxiety disorder) 04/05/2020   History of hypertension 04/05/2020   Peripheral neuropathy 04/05/2020   Primary insomnia 04/05/2020   Malnutrition of moderate degree 03/13/2020   Hypertensive urgency 03/11/2020   Myoclonia 03/11/2020   Abscess of vulva 02/19/2020   Pericolonic abscess 02/06/2020   Chest pain 05/10/2019   Anemia 04/25/2019   SVT (supraventricular tachycardia) (HCC) 04/25/2019   Elevated troponin 04/25/2019   ESRD on dialysis (HCC) 04/25/2019   Bacterial infection, unspecified 04/17/2019   Pain, unspecified 04/17/2019   Pruritus, unspecified 04/17/2019   Benign paroxysmal positional vertigo of left ear 08/17/2018   Acute on chronic blood loss anemia 05/21/2018   Lower GI bleed 05/20/2018   Symptomatic anemia 10/28/2017   Acidosis 05/24/2017   Chills (without fever) 05/24/2017   Coagulation defect, unspecified (HCC)  05/24/2017   Dependence on renal dialysis (HCC) 05/24/2017   Encounter for fitting and adjustment of extracorporeal dialysis catheter (HCC) 05/24/2017   Fluid overload, unspecified 05/24/2017   Iron  deficiency anemia, unspecified 05/24/2017   Liver disease, unspecified 05/24/2017   Other disorders of electrolyte and fluid balance, not elsewhere classified 05/24/2017   Other disorders resulting from impaired renal tubular function 05/24/2017   Other long term (current) drug therapy 05/24/2017   Secondary hyperparathyroidism of renal origin (HCC) 05/24/2017   Unspecified abnormal findings in urine 05/24/2017   Unspecified jaundice 05/24/2017   Chronic kidney disease 05/24/2017   Type 2 diabetes mellitus with other diabetic kidney complication (HCC) 05/24/2017   ESRD on peritoneal dialysis (HCC) 04/01/2016   Anemia due to chronic kidney disease 04/01/2016   GERD (gastroesophageal reflux disease) 04/01/2016   Atypical chest pain    Nephrotic syndrome 10/02/2013   Dyslipidemia 10/02/2013   PAT (paroxysmal atrial tachycardia) (HCC) 09/29/2013   Normocytic anemia 06/29/2013   Glomerulonephritis 01/28/2013   Dyspnea 07/04/2011  Morbid obesity (HCC) 07/04/2011   Essential hypertension 07/04/2011   Non-insulin  dependent type 2 diabetes mellitus (HCC) 07/04/2011   Hypercholesterolemia 07/04/2011    PCP: Jesus Griffes, MD   REFERRING PROVIDER: Jesus Griffes, MD   REFERRING DIAG: [phone] (ICD-10-CM) - Other specified enthesopathies of right lower limb, excluding foot   THERAPY DIAG:  Pain in right leg  Difficulty in walking, not elsewhere classified  Rationale for Evaluation and Treatment: Rehabilitation  ONSET DATE: 01/31/23  SUBJECTIVE:   SUBJECTIVE STATEMENT: Pt reports her pain is low because she rested yesterday.   EVAL: Pt reports she has chronic R leg pain, more lateral and on the front and back of her R thigh. She experiences burning, swelling and sharp pain.  Pt also has minimal low back pain on the R and neuropathy of both feet. 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  PERTINENT HISTORY: Arthritis, CHF, anxiety, ESRD, DM2, neuropathy, high BMI  PAIN:  Are you having pain? Yes: NPRS scale: currently 6/10. Pain range on eval 6-10/10 Pain location: on the lateral ant and post aspects of the R leg Pain description: Burning, sharp, swelling Aggravating factors: Prolonged time on feet, lying on R side Relieving factors: Massager, rest  PRECAUTIONS: None  RED FLAGS: None   WEIGHT BEARING RESTRICTIONS: No  FALLS:  Has patient fallen in last 6 months? No  LIVING ENVIRONMENT: Lives with: lives with their family Lives in: House/apartment Stairs: No Able to access home  OCCUPATION: Disability  PLOF: Independent  PATIENT GOALS: Pain relief  NEXT MD VISIT: 07/1023  OBJECTIVE:  Note: Objective measures were completed at Evaluation unless otherwise noted.  DIAGNOSTIC FINDINGS:   MRI Findings: There is focal fat deposition/small intramuscular lipoma within the proximal right adductor magnus (7:29). Mild fatty atrophy of the visualized right thigh musculature. Insertional gluteus minimus and medius tendinosis. Right common hamstring origin tendinosis. Mild intramuscular edema within the quadratus femoris with narrowed ischiofemoral interval. Tendinosis of the distal obturator externus. The hip and knee are approximated on the large field-of-view images. Degenerative subchondral signal changes with cartilage thinning at the knee. Trace right knee effusion. The neurovascular bundles are normal in course and contour. There is mild subcutaneous edema in the superficial soft tissues of the lateral right hip. No suspicious enhancing soft tissue or osseous lesion within the right thigh. No rim-enhancing fluid collection. Partially visualized postsurgical changes in the right pelvis.   PATIENT SURVEYS:  LEFS: 31/80=  39%  COGNITION: Overall cognitive status: Within functional limits for tasks assessed     SENSATION: WFL  EDEMA:  Swelling of the R thigh  MUSCLE LENGTH: Hamstrings: Right limited due to pain deg; Left WNLs deg Debby test: Right WNLs deg; Left WNLs deg  POSTURE: rounded shoulders, forward head, and flexed trunk   PALPATION: Markedly TTP to the [place] R thigh, R greater trochanter area, post. thigh T LOWER EXTREMITY ROM:  Active ROM Right eval Left eval  Hip flexion 70 pain   Hip extension    Hip abduction 25 pain   Hip adduction    Hip internal rotation    Hip external rotation 20 pain   Knee flexion    Knee extension    Ankle dorsiflexion    Ankle plantarflexion    Ankle inversion    Ankle eversion     (Blank rows = not tested)  LOWER EXTREMITY MMT:  All MMT for the R leg were limited by pain MMT Right eval Left eval  Hip  flexion 2   Hip extension 2   Hip abduction 2   Hip adduction 2   Hip internal rotation 2   Hip external rotation 2   Knee flexion 3   Knee extension 3   Ankle dorsiflexion 3   Ankle plantarflexion 2   Ankle inversion    Ankle eversion     (Blank rows = not tested)  LOWER EXTREMITY SPECIAL TESTS:  NT  FUNCTIONAL TESTS:  5 times sit to stand: TBA 2 minute walk test: TBA 07/15/23 14.9 sec  5 x STS  with UE 356 feet 2 MWT  GAIT: Distance walked: 149ft Assistive device utilized: None Level of assistance: Complete Independence Comments: Antalgic gait pattern                                                                                                                                TREATMENT DATE:   [facility] Adult PT Treatment:                                                DATE: 07/15/23 Therapeutic Exercise: 1 set each Quad set  Heel slides Hamstring set Hip abduction iso GTB at ankle supine - 2 pillows under knees for comfort  Supine gluteal sets- HEP  Therapeutic Activity: 14.9 sec  5 x STS  with UE 356 feet 2  MWT Modalities: Ice pack 8 minutes right thigh supine with small bolster concurrent with self care Self Care:  Frequent cold pack applications to reduce edema Avoidance of pain aggravation with HEP      [facility] Adult PT Treatment:                                                DATE: 07/13/23 Therapeutic Exercise: Developed, instructed in, and pt completed therex as noted in HEP  Self Care: Elevation and ice packs for pain and swelling   PATIENT EDUCATION:  Education details: Eval findings, POC, HEP, self care  Person educated: Patient Education method: Explanation, Demonstration, Tactile cues, Verbal cues, and Handouts Education comprehension: verbalized understanding, returned demonstration, verbal cues required, and tactile cues required  HOME EXERCISE PROGRAM: Access Code: [email or web address] URL: [email or web address] Date: 07/13/2023 Prepared by: Dasie Daft  Exercises - Supine Quad Set  - 1 x daily - 7 x weekly - 1 sets - 10 reps - 5 hold - Supine Heel Slides  - 1 x daily - 7 x weekly - 1 sets - 10 reps - 3 hold - Supine Isometric Hamstring Set  - 1 x daily - 7 x weekly - 1 sets - 10 reps - 5 hold - Supine Hip Abduction with Resistance at Ankles  -  1 x daily - 7 x weekly - 1 sets - 10 reps - 5 hold  ASSESSMENT:  CLINICAL IMPRESSION: Pt is here for first treatment after initial evaluation 2 days ago. She has tried her HEP. Time spent with review of HEP with cues for correct execution and not to over do it. Also captured baseline 2 MWT and 5 x STS. Her HEP is mostly isometrics and she was encouraged to complete 1 set each day due to irritability of symptoms. Trial of ice pack to right thigh post session. She demonstrated increased visual edema in thigh but did report decreased pain after ice pack.   EVAL :Patient is a 48 y.o. female who was seen today for physical therapy evaluation and treatment for M76.891 (ICD-10-CM) - Other specified enthesopathies of right lower limb,  excluding foot. Pt presents to PT with decreased R LE AROM, strength, and function consistent with the referring Dx. Per LEFS, pt's rates the functional use of the R LE as low, 39%. Pt will benefit from skilled PT to address impairments to optimize function with less pain.  OBJECTIVE IMPAIRMENTS: decreased activity tolerance, decreased mobility, difficulty walking, decreased ROM, decreased strength, increased edema, obesity, and pain.   ACTIVITY LIMITATIONS: carrying, lifting, bending, sitting, standing, squatting, sleeping, stairs, transfers, bed mobility, bathing, toileting, dressing, locomotion level, and caring for others  PARTICIPATION LIMITATIONS: meal prep, cleaning, laundry, driving, shopping, and community activity  PERSONAL FACTORS: Fitness, Past/current experiences, Time since onset of injury/illness/exacerbation, and 3+ comorbidities: Arthritis, CHF, anxiety, ESRD, DM2, neuropathy, high BMI are also affecting patient's functional outcome.   REHAB POTENTIAL: Good  CLINICAL DECISION MAKING: Evolving/moderate complexity  EVALUATION COMPLEXITY: Moderate   GOALS:   SHORT TERM GOALS: Target date: 07/30/23 Pt will be Ind in an initial HEP  Baseline: started Goal status: INITIAL  2.  Pt will voice understanding of measures to assist in pain reduction  Baseline: started Goal status: INITIAL  3.  Increase R hip flexion AROM to 90d for improved function and as indication of improved pain Baseline:  Goal status: INITIAL  LONG TERM GOALS: Target date: 09/03/23  Pt will be Ind in a final HEP to maintain achieved LOF  Baseline:  Goal status: INITIAL  2.  Increased R hip AROM to flexion 100d, ER 35d, and abd to 40d for improved function with a normalized gait pattern and as indication of improved pain. Baseline: 70, 20, and 25 respectively Goal status: INITIAL  3.  Increase R hip strength to 3/5 and knee strength to 4/5 for improved functional use of the R LE with a normalized  gait pattern Baseline:  Goal status: INITIAL  4.  Improve 5xSTS by MCID of 5 and by MCID of 10ft as indication of improved functional mobility  Baseline: TBA 7/10/5: 356 feet and 14.9 sec using UE Goal status: INITIAL  5.  Pt's LEFS score will improve by the MCID to 54% as indication of improved function  Baseline: 39% Goal status: INITIAL   PLAN:  PT FREQUENCY: 2x/week  PT DURATION: 6 weeks  PLANNED INTERVENTIONS: 97164- PT Re-evaluation, 97110-Therapeutic exercises, 97530- Therapeutic activity, 97112- Neuromuscular re-education, 97535- Self Care, [id number]- Manual therapy, [id number]- Gait training, [phone]- Aquatic Therapy, [phone]- Electrical stimulation (unattended), 97016- Vasopneumatic device, 20560 (1-2 muscles), 20561 (3+ muscles)- Dry Needling, Patient/Family education, Balance training, Stair training, Taping, Joint mobilization, Cryotherapy, and Moist heat  PLAN FOR NEXT SESSION: ; assess response to HEP; progress therex as indicated; use of modalities, manual therapy; and TPDN as indicated.  Harlene Persons, PTA 07/15/23 12:18 PM Phone: [phone] Fax: [phone]

## 2023-07-18 DIAGNOSIS — G4733 Obstructive sleep apnea (adult) (pediatric): Secondary | ICD-10-CM | POA: Diagnosis not present

## 2023-07-20 ENCOUNTER — Other Ambulatory Visit
Admission: RE | Admit: 2023-07-20 | Discharge: 2023-07-20 | Disposition: A | Discharge status: Home / Self Care | Attending: Nephrology | Admitting: Nephrology

## 2023-07-20 DIAGNOSIS — Z9483 Pancreas transplant status: Secondary | ICD-10-CM | POA: Insufficient documentation

## 2023-07-20 DIAGNOSIS — D899 Disorder involving the immune mechanism, unspecified: Secondary | ICD-10-CM | POA: Diagnosis present

## 2023-07-20 DIAGNOSIS — N390 Urinary tract infection, site not specified: Secondary | ICD-10-CM | POA: Insufficient documentation

## 2023-07-20 DIAGNOSIS — B259 Cytomegaloviral disease, unspecified: Secondary | ICD-10-CM | POA: Diagnosis not present

## 2023-07-20 DIAGNOSIS — Z789 Other specified health status: Secondary | ICD-10-CM | POA: Insufficient documentation

## 2023-07-20 DIAGNOSIS — D631 Anemia in chronic kidney disease: Secondary | ICD-10-CM | POA: Insufficient documentation

## 2023-07-20 DIAGNOSIS — Z114 Encounter for screening for human immunodeficiency virus [HIV]: Secondary | ICD-10-CM | POA: Insufficient documentation

## 2023-07-20 DIAGNOSIS — Z09 Encounter for follow-up examination after completed treatment for conditions other than malignant neoplasm: Secondary | ICD-10-CM | POA: Insufficient documentation

## 2023-07-20 DIAGNOSIS — T8610 Unspecified complication of kidney transplant: Secondary | ICD-10-CM | POA: Diagnosis not present

## 2023-07-20 DIAGNOSIS — Z79899 Other long term (current) drug therapy: Secondary | ICD-10-CM | POA: Insufficient documentation

## 2023-07-20 DIAGNOSIS — E1129 Type 2 diabetes mellitus with other diabetic kidney complication: Secondary | ICD-10-CM | POA: Insufficient documentation

## 2023-07-20 DIAGNOSIS — E559 Vitamin D deficiency, unspecified: Secondary | ICD-10-CM | POA: Diagnosis not present

## 2023-07-20 DIAGNOSIS — Z940 Kidney transplant status: Secondary | ICD-10-CM | POA: Diagnosis present

## 2023-07-20 LAB — BASIC METABOLIC PANEL WITH GFR
Anion gap: 10 (ref 5–15)
BUN: 26 mg/dL — ABNORMAL HIGH (ref 6–20)
CO2: 24 mmol/L (ref 22–32)
Calcium: 10.5 mg/dL — ABNORMAL HIGH (ref 8.9–10.3)
Chloride: 105 mmol/L (ref 98–111)
Creatinine, Ser: 1.04 mg/dL — ABNORMAL HIGH (ref 0.44–1.00)
GFR, Estimated: >60 mL/min (ref >60)
Glucose, Bld: 115 mg/dL — ABNORMAL HIGH (ref 70–99)
Potassium: 4.8 mmol/L (ref 3.5–5.1)
Sodium: 139 mmol/L (ref 135–145)

## 2023-07-20 LAB — URINALYSIS, ROUTINE W REFLEX MICROSCOPIC
Bilirubin Urine: NEGATIVE
Glucose, UA: NEGATIVE mg/dL
Hgb urine dipstick: NEGATIVE
Ketones, ur: 5 mg/dL — AB
Leukocytes,Ua: NEGATIVE
Nitrite: NEGATIVE
Protein, ur: 100 mg/dL — AB
Specific Gravity, Urine: 1.021 (ref 1.005–1.030)
pH: 5 (ref 5.0–8.0)

## 2023-07-20 LAB — CBC WITH DIFFERENTIAL/PLATELET
Abs Immature Granulocytes: 0.03 K/uL (ref 0.00–0.07)
Basophils Absolute: 0 K/uL (ref 0.0–0.1)
Basophils Relative: 1 %
Eosinophils Absolute: 0.1 K/uL (ref 0.0–0.5)
Eosinophils Relative: 2 %
HCT: 36.1 % (ref 36.0–46.0)
Hemoglobin: 10.7 g/dL — ABNORMAL LOW (ref 12.0–15.0)
Immature Granulocytes: 1 %
Lymphocytes Relative: 20 %
Lymphs Abs: 0.8 K/uL (ref 0.7–4.0)
MCH: 23.2 pg — ABNORMAL LOW (ref 26.0–34.0)
MCHC: 29.6 g/dL — ABNORMAL LOW (ref 30.0–36.0)
MCV: 78.3 fL — ABNORMAL LOW (ref 80.0–100.0)
Monocytes Absolute: 0.4 K/uL (ref 0.1–1.0)
Monocytes Relative: 10 %
Neutro Abs: 2.8 K/uL (ref 1.7–7.7)
Neutrophils Relative %: 66 %
Platelets: 217 K/uL (ref 150–400)
RBC: 4.61 MIL/uL (ref 3.87–5.11)
RDW: 15.3 % (ref 11.5–15.5)
WBC: 4.2 K/uL (ref 4.0–10.5)
nRBC: 0 % (ref 0.0–0.2)

## 2023-07-20 LAB — MAGNESIUM: Magnesium: 1.4 mg/dL — ABNORMAL LOW (ref 1.7–2.4)

## 2023-07-20 LAB — PHOSPHORUS: Phosphorus: 2.6 mg/dL (ref 2.5–4.6)

## 2023-07-21 ENCOUNTER — Ambulatory Visit

## 2023-07-21 DIAGNOSIS — R600 Localized edema: Secondary | ICD-10-CM

## 2023-07-21 DIAGNOSIS — M79604 Pain in right leg: Secondary | ICD-10-CM

## 2023-07-21 DIAGNOSIS — R262 Difficulty in walking, not elsewhere classified: Secondary | ICD-10-CM

## 2023-07-21 LAB — URINE CULTURE: Culture: NO GROWTH

## 2023-07-21 NOTE — Therapy (Signed)
 [facility] LOWER EXTREMITY TREATMENT    [name] MRN: [id number] DOB:1975/06/12, 48 y.o., female Today's Date: 07/21/2023  END OF SESSION:  PT End of Session - 07/21/23 1428     Visit Number 3    Number of Visits 13    Date for PT Re-Evaluation 09/03/23    Authorization Type [employer] DUAL [employer]PLETE    Authorization - Visit Number 3    Authorization - Number of Visits 27    PT Start Time 1330    PT Stop Time 1420    PT Time Calculation (min) 50 min    Activity Tolerance Patient limited by pain    Behavior During Therapy [facility] for tasks assessed/performed           Past Medical History:  Diagnosis Date   Acid reflux    Anemia 04/2019   REQUIRING BLOOD TRANSFUSIONS   Anxiety    Arthritis    CHF (congestive heart failure) (HCC)    Dyspnea    with exertion   Dysrhythmia    tachycardia   ESRD (end stage renal disease) (HCC)    [facility] [place]   Facial twitching 11/2020   mouth twitch, eyes roll back, neurology started patient on low dose   Sinemet  IR.   GI bleeding 12/2017   Gout    Headache(784.0)    related to chemo; sometimes weekly (09/12/2013)   Heart murmur    Systolic per Dr Salena Negri' notes   High cholesterol    History of blood transfusion a couple   related to low counts   Hypertension    Iron  deficiency anemia    get epogen shots q month (02/20/2014)   MCGN (minimal change glomerulonephritis)    using chemo to tx;  finished my last tx in 12/2013   Neuropathy    Peritoneal dialysis status (HCC)    Stroke (HCC) 08/15/2017   ruled out that it was not a stroke   Past Surgical History:  Procedure Laterality Date   ABDOMINAL HYSTERECTOMY  2010   laparoscopic   ANKLE FRACTURE SURGERY Right 1994   AV FISTULA PLACEMENT Left 04/14/2016   Procedure: ARTERIOVENOUS (AV) FISTULA CREATION;  Surgeon: Lonni GORMAN Blade, MD;  Location: [facility] OR;  Service: Vascular;  Laterality: Left;   AV  FISTULA PLACEMENT Left 08/09/2017   Procedure: INSERTION OF ARTERIOVENOUS (AV) GORE-TEX GRAFT LEFT ARM;  Surgeon: Harvey Carlin BRAVO, MD;  [facility]: [facility] OR;  Service: Vascular;  Laterality: Left;   AV FISTULA PLACEMENT Right 03/15/2020   Procedure: RIGHT ARM ARTERIOVENOUS (AV) FISTULA CREATION 1st Stage Basilic Transposition;  Surgeon: Serene Gaile ORN, MD;  Location: [facility] OR;  Service: Vascular;  Laterality: Right;   AV FISTULA PLACEMENT Right 02/24/2021   Procedure: RIGHT ARM ARTERIOVENOUS  FISTULA CREATION;  Surgeon: Lanis Fonda BRAVO, MD;  Location: [facility] OR;  Service: Vascular;  Laterality: Right;  regional block   AVGG REMOVAL Left 08/11/2017   Procedure: REMOVAL OF ARTERIOVENOUS GORETEX GRAFT (AVGG);  Surgeon: Serene Gaile ORN, MD;  Location: [facility] OR;  Service: Vascular;  Laterality: Left;   BASCILIC VEIN TRANSPOSITION Right 05/17/2020   Procedure: RIGHT UPPER EXTREMITY SECOND STAGE BASCILIC VEIN TRANSPOSITION;  Surgeon: Serene Gaile ORN, MD;  [facility]: [facility] OR;  Service: Vascular;  Laterality: Right;   BIOPSY  09/03/2017   Procedure: BIOPSY;  Surgeon: Elicia Claw, MD;  Location: [facility] ENDOSCOPY;  Service: Gastroenterology;;   BIOPSY  12/24/2017   Procedure: BIOPSY;  Surgeon: Wilhelmenia Aloha Raddle., MD;  Location: [facility] ENDOSCOPY;  Service: Gastroenterology;;   CARDIAC CATHETERIZATION  2000's   COLONOSCOPY N/A 10/22/2022   Procedure: COLONOSCOPY;  Surgeon: Maryruth Ole DASEN, MD;  Location: [facility] ENDOSCOPY;  Service: Endoscopy;  Laterality: N/A;   COLONOSCOPY WITH PROPOFOL  N/A 09/04/2017   Procedure: COLONOSCOPY WITH PROPOFOL ;  Surgeon: Saintclair Jasper, MD;  Location: [facility] ENDOSCOPY;  Service: Gastroenterology;  Laterality: N/A;   ENTEROSCOPY N/A 12/24/2017   Procedure: ENTEROSCOPY;  Surgeon: Wilhelmenia Aloha Raddle., MD;  Location: [facility] ENDOSCOPY;  Service: Gastroenterology;  Laterality: N/A;   ESOPHAGOGASTRODUODENOSCOPY     ESOPHAGOGASTRODUODENOSCOPY (EGD) WITH PROPOFOL  Left 06/04/2017   Procedure:  ESOPHAGOGASTRODUODENOSCOPY (EGD) WITH PROPOFOL ;  Surgeon: Burnette Fallow, MD;  Location: [facility] ENDOSCOPY;  Service: Endoscopy;  Laterality: Left;   ESOPHAGOGASTRODUODENOSCOPY (EGD) WITH PROPOFOL  N/A 09/03/2017   Procedure: ESOPHAGOGASTRODUODENOSCOPY (EGD) WITH PROPOFOL ;  Surgeon: Elicia Claw, MD;  [facility] ENDOSCOPY;  Service: Gastroenterology;  Laterality: N/A;   ESOPHAGOGASTRODUODENOSCOPY (EGD) WITH PROPOFOL  N/A 04/27/2022   Procedure: ESOPHAGOGASTRODUODENOSCOPY (EGD) WITH PROPOFOL ;  Surgeon: Maryruth Ole DASEN, MD;  Location: [facility] ENDOSCOPY;  Service: Endoscopy;  Laterality: N/A;   FISTULA SUPERFICIALIZATION Left 04/02/2017   Procedure: FISTULA SUPERFICIALIZATION LEFT ARM;  Surgeon: Serene Gaile ORN, MD;  Location: [facility] OR;  Service: Vascular;  Laterality: Left;   FISTULA SUPERFICIALIZATION Right 04/14/2021   Procedure: RIGHT ARM FISTULA SUPERFICIALIZATION;  Surgeon: Lanis Fonda BRAVO, MD;  Location: [facility] OR;  Service: Vascular;  Laterality: Right;  PERIPHERAL NERVE BLOCK   FISTULOGRAM Left 04/07/2016   Procedure: FISTULOGRAM;  Surgeon: Penne Lonni Colorado, MD;  Location: [facility] OR;  Service: Vascular;  Laterality: Left;   FRACTURE SURGERY     right ankle   GIVENS CAPSULE STUDY N/A 10/29/2017   Procedure: GIVENS CAPSULE STUDY;  Surgeon: Dianna Specking, MD;  Location: [facility] ENDOSCOPY;  Service: Endoscopy;  Laterality: N/A;   GIVENS CAPSULE STUDY N/A 12/24/2017   Procedure: GIVENS CAPSULE STUDY;  Surgeon: Wilhelmenia Aloha Raddle., MD;  Location: [facility] ENDOSCOPY;  Service: Gastroenterology;  Laterality: N/A;   GIVENS CAPSULE STUDY N/A 05/22/2018   Procedure: GIVENS CAPSULE STUDY;  Surgeon: Wilhelmenia Aloha Raddle., MD;  Location: [facility] ENDOSCOPY;  Service: Gastroenterology;  Laterality: N/A;   INSERTION OF DIALYSIS CATHETER Right 04/07/2016   Procedure: INSERTION OF DIALYSIS CATHETER;  Surgeon: Penne Lonni Colorado, MD;  Location: [facility] OR;  Service: Vascular;  Laterality: Right;   INSERTION OF DIALYSIS  CATHETER Right 04/04/2017   Procedure: INSERTION OF DIALYSIS CATHETER;  Surgeon: [place] Penne Lonni, MD;  Location: [facility] OR;  Service: Vascular;  Laterality: Right;   IR FLUORO GUIDE CV LINE LEFT  03/13/2020   IR US  GUIDE VASC ACCESS LEFT  03/13/2020   LEFT HEART CATH AND CORONARY ANGIOGRAPHY N/A 05/12/2019   Procedure: LEFT HEART CATH AND CORONARY ANGIOGRAPHY;  Surgeon: Claudene Pacific, MD;  [facility] INVASIVE CV LAB;  Service: Cardiovascular;  Laterality: N/A;   LIGATION OF ARTERIOVENOUS  FISTULA Left 04/14/2016   Procedure: LIGATION OF ARTERIOVENOUS  FISTULA;  Surgeon: Lonni GORMAN Blade, MD;  Location: [facility] OR;  Service: Vascular;  Laterality: Left;   LIGATION OF COMPETING BRANCHES OF ARTERIOVENOUS FISTULA Right 05/17/2020   Procedure: LIGATION OF COMPETING BRANCHES OF RIGHT ARM ARTERIOVENOUS FISTULA;  Surgeon: Serene Gaile ORN, MD;  Location: [facility] OR;  Service: Vascular;  Laterality: Right;   PATCH ANGIOPLASTY Left 06/19/2016   Procedure: PATCH ANGIOPLASTY LEFT ARTERIOVENOUS FISTULA;  Surgeon: Blade Lonni GORMAN, MD;  Location: [facility] OR;  Service: Vascular;  Laterality: Left;   POLYPECTOMY  09/04/2017   Procedure: POLYPECTOMY;  Surgeon: Saintclair Jasper, MD;  Location: [facility] ENDOSCOPY;  Service: Gastroenterology;;   REVISON OF ARTERIOVENOUS FISTULA Left 06/19/2016   Procedure: REVISION SUPERFICIALIZATION OF BRACHIOCEPHALIC ARTERIOVENOUS FISTULA;  Surgeon: Eliza Lonni RAMAN, MD;  Location: [facility] OR;  Service: Vascular;  Laterality: Left;   UPPER EXTREMITY VENOGRAPHY Bilateral 02/19/2021   Procedure: UPPER EXTREMITY VENOGRAPHY;  Surgeon: Lanis Fonda BRAVO, MD;  Location: [facility] INVASIVE CV LAB;  Service: Cardiovascular;  Laterality: Bilateral;   Patient Active Problem List   Diagnosis Date Noted   Gastroparesis 12/31/2022   OSA on CPAP 12/31/2022   Rectal bleeding 10/23/2022   Acute on chronic anemia 10/19/2022   Obesity (BMI 30-39.9) 08/25/2022   Acute encephalopathy 08/24/2022   (HFpEF) heart failure  with preserved ejection fraction (HCC) 08/24/2022   Acute respiratory distress 08/11/2022   Pneumonia due to COVID-19 virus 08/10/2022   Facial twitching 08/09/2022   CAP (community acquired pneumonia) 07/21/2022   Acute pulmonary edema (HCC) 07/20/2022   Hypervolemia associated with renal insufficiency 05/11/2022   Hyperkalemia 05/11/2022   Restless leg syndrome 04/23/2022   Anaphylactic shock, unspecified, initial encounter 09/07/2021   Gout 08/21/2021   GERD without esophagitis 08/21/2021   Anxiety and depression 08/21/2021   Neuropathic pain 05/26/2021   Dystonia 01/13/2021   Polyneuropathy associated with underlying disease (HCC) 01/13/2021   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   Other specified complication of vascular prosthetic devices, implants and grafts, initial encounter (HCC) 09/03/2020   Contact with and (suspected) exposure to covid-19 08/13/2020   Altered mental status, unspecified 06/25/2020   Other irregular eye movements 06/25/2020   PD catheter dysfunction (HCC) 04/26/2020   Other disorders of phosphorus metabolism 04/17/2020   Hypercalcemia 04/15/2020   Impacted cerumen of both ears 04/05/2020   Chronic gouty arthritis 04/05/2020   GAD (generalized anxiety disorder) 04/05/2020   History of hypertension 04/05/2020   Peripheral neuropathy 04/05/2020   Primary insomnia 04/05/2020   Malnutrition of moderate degree 03/13/2020   Hypertensive urgency 03/11/2020   Myoclonia 03/11/2020   Abscess of vulva 02/19/2020   Pericolonic abscess 02/06/2020   Chest pain 05/10/2019   Anemia 04/25/2019   SVT (supraventricular tachycardia) (HCC) 04/25/2019   Elevated troponin 04/25/2019   ESRD on dialysis (HCC) 04/25/2019   Bacterial infection, unspecified 04/17/2019   Pain, unspecified 04/17/2019   Pruritus, unspecified 04/17/2019   Benign paroxysmal positional vertigo of left ear 08/17/2018   Acute on chronic blood loss anemia 05/21/2018   Lower GI bleed 05/20/2018   Symptomatic  anemia 10/28/2017   Acidosis 05/24/2017   Chills (without fever) 05/24/2017   Coagulation defect, unspecified (HCC) 05/24/2017   Dependence on renal dialysis (HCC) 05/24/2017   Encounter for fitting and adjustment of extracorporeal dialysis catheter (HCC) 05/24/2017   Fluid overload, unspecified 05/24/2017   Iron  deficiency anemia, unspecified 05/24/2017   Liver disease, unspecified 05/24/2017   Other disorders of electrolyte and fluid balance, not elsewhere classified 05/24/2017   Other disorders resulting from impaired renal tubular function 05/24/2017   Other long term (current) drug therapy 05/24/2017   Secondary hyperparathyroidism of renal origin (HCC) 05/24/2017   Unspecified abnormal findings in urine 05/24/2017   Unspecified jaundice 05/24/2017   Chronic kidney disease 05/24/2017   Type 2 diabetes mellitus with other diabetic kidney complication (HCC) 05/24/2017   ESRD on peritoneal dialysis (HCC) 04/01/2016   Anemia due to chronic kidney disease 04/01/2016   GERD (gastroesophageal reflux disease) 04/01/2016   Atypical chest pain    Nephrotic syndrome 10/02/2013   Dyslipidemia 10/02/2013   PAT (paroxysmal  atrial tachycardia) (HCC) 09/29/2013   Normocytic anemia 06/29/2013   Glomerulonephritis 01/28/2013   Dyspnea 07/04/2011   Morbid obesity (HCC) 07/04/2011   Essential hypertension 07/04/2011   Non-insulin  dependent type 2 diabetes mellitus (HCC) 07/04/2011   Hypercholesterolemia 07/04/2011    PCP: Jesus Griffes, MD   REFERRING PROVIDER: Jesus Griffes, MD   REFERRING DIAG: [phone] (ICD-10-CM) - Other specified enthesopathies of right lower limb, excluding foot   THERAPY DIAG:  Pain in right leg  Difficulty in walking, not elsewhere classified  Localized edema  Rationale for Evaluation and Treatment: Rehabilitation  ONSET DATE: 01/31/23  SUBJECTIVE:   SUBJECTIVE STATEMENT: Pt reports R leg pain seems a little better. Pt reports completing her  HEP every other day.  EVAL: Pt reports she has chronic R leg pain, more lateral and on the front and back of her R thigh. She experiences burning, swelling and sharp pain. Pt also has minimal low back pain on the R and neuropathy of both feet. 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  PERTINENT HISTORY: Arthritis, CHF, anxiety, ESRD, DM2, neuropathy, high BMI  PAIN:  Are you having pain? Yes: NPRS scale: currently 5/10. Pain range on eval 6-10/10 Pain location: on the lateral ant and post aspects of the R leg Pain description: Burning, sharp, swelling Aggravating factors: Prolonged time on feet, lying on R side Relieving factors: Massager, rest  PRECAUTIONS: None  RED FLAGS: None   WEIGHT BEARING RESTRICTIONS: No  FALLS:  Has patient fallen in last 6 months? No  LIVING ENVIRONMENT: Lives with: lives with their family Lives in: House/apartment Stairs: No Able to access home  OCCUPATION: Disability  PLOF: Independent  PATIENT GOALS: Pain relief  NEXT MD VISIT: 07/1023  OBJECTIVE:  Note: Objective measures were completed at Evaluation unless otherwise noted.  DIAGNOSTIC FINDINGS:   MRI Findings: There is focal fat deposition/small intramuscular lipoma within the proximal right adductor magnus (7:29). Mild fatty atrophy of the visualized right thigh musculature. Insertional gluteus minimus and medius tendinosis. Right common hamstring origin tendinosis. Mild intramuscular edema within the quadratus femoris with narrowed ischiofemoral interval. Tendinosis of the distal obturator externus. The hip and knee are approximated on the large field-of-view images. Degenerative subchondral signal changes with cartilage thinning at the knee. Trace right knee effusion. The neurovascular bundles are normal in course and contour. There is mild subcutaneous edema in the superficial soft tissues of the lateral right hip. No suspicious enhancing soft tissue or osseous  lesion within the right thigh. No rim-enhancing fluid collection. Partially visualized postsurgical changes in the right pelvis.   PATIENT SURVEYS:  LEFS: 31/80= 39%  COGNITION: Overall cognitive status: Within functional limits for tasks assessed     SENSATION: WFL  EDEMA:  Swelling of the R thigh  MUSCLE LENGTH: Hamstrings: Right limited due to pain deg; Left WNLs deg Debby test: Right WNLs deg; Left WNLs deg  POSTURE: rounded shoulders, forward head, and flexed trunk   PALPATION: Markedly TTP to the Ant/lat R thigh, R greater trochanter area, post. thigh T LOWER EXTREMITY ROM:  Active ROM Right eval Left eval  Hip flexion 70 pain   Hip extension    Hip abduction 25 pain   Hip adduction    Hip internal rotation    Hip external rotation 20 pain   Knee flexion    Knee extension    Ankle dorsiflexion    Ankle plantarflexion    Ankle inversion    Ankle eversion     (  Blank rows = not tested)  LOWER EXTREMITY MMT:  All MMT for the R leg were limited by pain MMT Right eval Left eval  Hip flexion 2   Hip extension 2   Hip abduction 2   Hip adduction 2   Hip internal rotation 2   Hip external rotation 2   Knee flexion 3   Knee extension 3   Ankle dorsiflexion 3   Ankle plantarflexion 2   Ankle inversion    Ankle eversion     (Blank rows = not tested)  LOWER EXTREMITY SPECIAL TESTS:  NT  FUNCTIONAL TESTS:  5 times sit to stand: TBA 2 minute walk test: TBA 07/15/23 14.9 sec  5 x STS  with UE 356 feet 2 MWT  GAIT: Distance walked: 118ft Assistive device utilized: None Level of assistance: Complete Independence Comments: Antalgic gait pattern                                                                                                                                TREATMENT DATE:   [facility] Adult PT Treatment:                                                DATE: 07/21/23 Therapeutic Exercise: 1 set each Quad set x10 5 Heel slides x10 Hamstring  set x10 5 Hip abduction iso GTB at ankle supine - 2 pillows under knees for comfort  Hip clam iso GTB at knee x10 5 Supine gluteal sets x10 5  SAQ x10 partial x10 Updated HEP Modalities: Ice pack 10 minutes right thigh and lateral hip supine with small bolster  [facility] Adult PT Treatment:                                                DATE: 07/15/23 Therapeutic Exercise: 1 set each Quad set  Heel slides Hamstring set Hip abduction iso GTB at ankle supine - 2 pillows under knees for comfort  Supine gluteal sets- HEP  Therapeutic Activity: 14.9 sec  5 x STS  with UE 356 feet 2 MWT Modalities: Ice pack 8 minutes right thigh supine with small bolster concurrent with self care Self Care:  Frequent cold pack applications to reduce edema Avoidance of pain aggravation with HEP      [facility] Adult PT Treatment:                                                DATE: 07/13/23 Therapeutic Exercise: Developed, instructed in, and pt completed therex as noted in  HEP  Self Care: Elevation and ice packs for pain and swelling   PATIENT EDUCATION:  Education details: Eval findings, POC, HEP, self care  Person educated: Patient Education method: Explanation, Demonstration, Tactile cues, Verbal cues, and Handouts Education comprehension: verbalized understanding, returned demonstration, verbal cues required, and tactile cues required  HOME EXERCISE PROGRAM: Access Code: [email or web address] URL: [email or web address] Date: 07/21/2023 Prepared by: Dasie Daft  Exercises - Supine Quad Set  - 1 x daily - 7 x weekly - 1 sets - 10 reps - 5 hold - Supine Heel Slides  - 1 x daily - 7 x weekly - 1 sets - 10 reps - 3 hold - Supine Isometric Hamstring Set  - 1 x daily - 7 x weekly - 1 sets - 10 reps - 5 hold - Supine Hip Abduction with Resistance at Ankles  - 1 x daily - 7 x weekly - 1 sets - 10 reps - 5 hold - Supine Gluteal Sets  - 1 x daily - 7 x weekly - 1 sets - 10 reps - 5 hold - Hooklying  Bilateral Isometric Clamshell  - 1 x daily - 7 x weekly - 1 sets - 10 reps - 5 hold - Supine Knee Extension Strengthening  - 1 x daily - 7 x weekly - 1 sets - 10 reps - 5 hold  ASSESSMENT:  CLINICAL IMPRESSION: Palpated lump on pt's lateral R LE. Lump appears to be between the vastus lateralis and the bicep femoris muscles and does not appear to be muscle. It does not contract during quad or hamstring set exs. PT was completed for strengthening, primarily isometric exs. Pt is now able to complete a heel slide and a partial SAQ. Pt is not able to complete a SLR more than a few inches. Pt reports pain is improving. Pt tolerated the prescribed exs today without adverse effects. Pt will continue to benefit from skilled PT to address impairments for improved function.  EVAL :Patient is a 48 y.o. female who was seen today for physical therapy evaluation and treatment for M76.891 (ICD-10-CM) - Other specified enthesopathies of right lower limb, excluding foot. Pt presents to PT with decreased R LE AROM, strength, and function consistent with the referring Dx. Per LEFS, pt's rates the functional use of the R LE as low, 39%. Pt will benefit from skilled PT to address impairments to optimize function with less pain.  OBJECTIVE IMPAIRMENTS: decreased activity tolerance, decreased mobility, difficulty walking, decreased ROM, decreased strength, increased edema, obesity, and pain.   ACTIVITY LIMITATIONS: carrying, lifting, bending, sitting, standing, squatting, sleeping, stairs, transfers, bed mobility, bathing, toileting, dressing, locomotion level, and caring for others  PARTICIPATION LIMITATIONS: meal prep, cleaning, laundry, driving, shopping, and community activity  PERSONAL FACTORS: Fitness, Past/current experiences, Time since onset of injury/illness/exacerbation, and 3+ comorbidities: Arthritis, CHF, anxiety, ESRD, DM2, neuropathy, high BMI are also affecting patient's functional outcome.   REHAB  POTENTIAL: Good  CLINICAL DECISION MAKING: Evolving/moderate complexity  EVALUATION COMPLEXITY: Moderate   GOALS:   SHORT TERM GOALS: Target date: 07/30/23 Pt will be Ind in an initial HEP  Baseline: started Goal status: ONGOING  2.  Pt will voice understanding of measures to assist in pain reduction  Baseline: started Goal status: ONGOING  3.  Increase R hip flexion AROM to 90d for improved function and as indication of improved pain Baseline:  Goal status: INITIAL  LONG TERM GOALS: Target date: 09/03/23  Pt will be Ind in a final HEP to maintain achieved LOF  Baseline:  Goal status: INITIAL  2.  Increased R hip AROM to flexion 100d, ER 35d, and abd to 40d for improved function with a normalized gait pattern and as indication of improved pain. Baseline: 70, 20, and 25 respectively Goal status: INITIAL  3.  Increase R hip strength to 3/5 and knee strength to 4/5 for improved functional use of the R LE with a normalized gait pattern Baseline:  Goal status: INITIAL  4.  Improve 5xSTS by MCID of 5 and by MCID of 44ft as indication of improved functional mobility  Baseline: TBA 7/10/5: 356 feet and 14.9 sec using UE Goal status: INITIAL  5.  Pt's LEFS score will improve by the MCID to 54% as indication of improved function  Baseline: 39% Goal status: INITIAL   PLAN:  PT FREQUENCY: 2x/week  PT DURATION: 6 weeks  PLANNED INTERVENTIONS: 97164- PT Re-evaluation, 97110-Therapeutic exercises, 97530- Therapeutic activity, 97112- Neuromuscular re-education, 97535- Self Care, [id number]- Manual therapy, [id number]- Gait training, [id number]- Aquatic Therapy, [phone]- Electrical stimulation (unattended), 97016- Vasopneumatic device, 20560 (1-2 muscles), 20561 (3+ muscles)- Dry Needling, Patient/Family education, Balance training, Stair training, Taping, Joint mobilization, Cryotherapy, and Moist heat  PLAN FOR NEXT SESSION: ; assess response to HEP; progress therex as indicated; use of  modalities, manual therapy; and TPDN as indicated.   Qamar Rosman MS, PT 07/21/23 2:32 PM

## 2023-07-22 ENCOUNTER — Other Ambulatory Visit: Payer: Self-pay | Admitting: Neurology

## 2023-07-22 ENCOUNTER — Encounter: Payer: Self-pay | Admitting: Physical Therapy

## 2023-07-22 ENCOUNTER — Telehealth: Payer: Self-pay

## 2023-07-22 ENCOUNTER — Ambulatory Visit: Admitting: Physical Therapy

## 2023-07-22 DIAGNOSIS — R262 Difficulty in walking, not elsewhere classified: Secondary | ICD-10-CM | POA: Diagnosis not present

## 2023-07-22 DIAGNOSIS — M79604 Pain in right leg: Secondary | ICD-10-CM

## 2023-07-22 DIAGNOSIS — R600 Localized edema: Secondary | ICD-10-CM | POA: Diagnosis not present

## 2023-07-22 LAB — TACROLIMUS LEVEL: Tacrolimus (FK506) - LabCorp: 8.1 ng/mL (ref 5.0–20.0)

## 2023-07-22 NOTE — Therapy (Signed)
 [facility] LOWER EXTREMITY TREATMENT    [name] MRN: [id number] DOB:[birth year], 48 y.o., female Today's Date: 07/22/2023  END OF SESSION:  PT End of Session - 07/22/23 0759     Visit Number 4    Number of Visits 13    Date for PT Re-Evaluation 09/03/23    Authorization Type [employer] DUAL COMPLETE    Authorization - Visit Number 4    Authorization - Number of Visits 27    PT Start Time 0800    PT Stop Time 0848    PT Time Calculation (min) 48 min            Past Medical History:  Diagnosis Date   Acid reflux    Anemia 04/2019   REQUIRING BLOOD TRANSFUSIONS   Anxiety    Arthritis    CHF (congestive heart failure) (HCC)    Dyspnea    with exertion   Dysrhythmia    tachycardia   ESRD (end stage renal disease) (HCC)    [facility] [place]   Facial twitching 11/2020   mouth twitch, eyes roll back, neurology started patient on low dose   Sinemet  IR.   GI bleeding 12/2017   Gout    Headache(784.0)    related to chemo; sometimes weekly (09/12/2013)   Heart murmur    Systolic per Dr Salena Negri' notes   High cholesterol    History of blood transfusion a couple   related to low counts   Hypertension    Iron  deficiency anemia    get epogen shots q month (02/20/2014)   MCGN (minimal change glomerulonephritis)    using chemo to tx;  finished my last tx in 12/2013   Neuropathy    Peritoneal dialysis status (HCC)    Stroke (HCC) 08/15/2017   ruled out that it was not a stroke   Past Surgical History:  Procedure Laterality Date   ABDOMINAL HYSTERECTOMY  2010   laparoscopic   ANKLE FRACTURE SURGERY Right 1994   AV FISTULA PLACEMENT Left 04/14/2016   Procedure: ARTERIOVENOUS (AV) FISTULA CREATION;  Surgeon: Lonni GORMAN Blade, MD;  Location: [facility] OR;  Service: Vascular;  Laterality: Left;   AV FISTULA PLACEMENT Left 08/09/2017   Procedure: INSERTION OF ARTERIOVENOUS (AV) GORE-TEX GRAFT LEFT ARM;   Surgeon: Harvey Carlin BRAVO, MD;  Location: [facility] OR;  Service: Vascular;  Laterality: Left;   AV FISTULA PLACEMENT Right 03/15/2020   Procedure: RIGHT ARM ARTERIOVENOUS (AV) FISTULA CREATION 1st Stage Basilic Transposition;  Surgeon: Serene Gaile ORN, MD;  Location: [facility] OR;  Service: Vascular;  Laterality: Right;   AV FISTULA PLACEMENT Right 02/24/2021   Procedure: RIGHT ARM ARTERIOVENOUS  FISTULA CREATION;  Surgeon: Lanis Fonda BRAVO, MD;  Location: [facility] OR;  Service: Vascular;  Laterality: Right;  regional block   AVGG REMOVAL Left 08/11/2017   Procedure: REMOVAL OF ARTERIOVENOUS GORETEX GRAFT (AVGG);  Surgeon: Serene Gaile ORN, MD;  Location: [facility] OR;  Service: Vascular;  Laterality: Left;   BASCILIC VEIN TRANSPOSITION Right 05/17/2020   Procedure: RIGHT UPPER EXTREMITY SECOND STAGE BASCILIC VEIN TRANSPOSITION;  Surgeon: Serene Gaile ORN, MD;  Location: [facility] OR;  Service: Vascular;  Laterality: Right;   BIOPSY  09/03/2017   Procedure: BIOPSY;  Surgeon: Elicia Claw, MD;  Location: [facility] ENDOSCOPY;  Service: Gastroenterology;;   BIOPSY  12/24/2017   Procedure: BIOPSY;  Surgeon: Wilhelmenia Aloha Raddle., MD;  Location: [facility] ENDOSCOPY;  Service: Gastroenterology;;   CARDIAC CATHETERIZATION  2000's   COLONOSCOPY N/A 10/22/2022  Procedure: COLONOSCOPY;  Surgeon: Maryruth Ole DASEN, MD;  Location: [facility] ENDOSCOPY;  Service: Endoscopy;  Laterality: N/A;   COLONOSCOPY WITH PROPOFOL  N/A 09/04/2017   Procedure: COLONOSCOPY WITH PROPOFOL ;  Surgeon: Saintclair Jasper, MD;  Location: [facility] ENDOSCOPY;  Service: Gastroenterology;  Laterality: N/A;   ENTEROSCOPY N/A 12/24/2017   Procedure: ENTEROSCOPY;  Surgeon: Wilhelmenia Aloha Raddle., MD;  Location: [facility] ENDOSCOPY;  Service: Gastroenterology;  Laterality: N/A;   ESOPHAGOGASTRODUODENOSCOPY     ESOPHAGOGASTRODUODENOSCOPY (EGD) WITH PROPOFOL  Left 06/04/2017   Procedure: ESOPHAGOGASTRODUODENOSCOPY (EGD) WITH PROPOFOL ;  Surgeon: Burnette Fallow, MD;  Location: [facility] ENDOSCOPY;  Service:  Endoscopy;  Laterality: Left;   ESOPHAGOGASTRODUODENOSCOPY (EGD) WITH PROPOFOL  N/A 09/03/2017   Procedure: ESOPHAGOGASTRODUODENOSCOPY (EGD) WITH PROPOFOL ;  Surgeon: Elicia Claw, MD;  Location: [facility] ENDOSCOPY;  Service: Gastroenterology;  Laterality: N/A;   ESOPHAGOGASTRODUODENOSCOPY (EGD) WITH PROPOFOL  N/A 04/27/2022   Procedure: ESOPHAGOGASTRODUODENOSCOPY (EGD) WITH PROPOFOL ;  Surgeon: Maryruth Ole DASEN, MD;  Location: [facility] ENDOSCOPY;  Service: Endoscopy;  Laterality: N/A;   FISTULA SUPERFICIALIZATION Left 04/02/2017   Procedure: FISTULA SUPERFICIALIZATION LEFT ARM;  Surgeon: Serene Gaile ORN, MD;  Location: [facility] OR;  Service: Vascular;  Laterality: Left;   FISTULA SUPERFICIALIZATION Right 04/14/2021   Procedure: RIGHT ARM FISTULA SUPERFICIALIZATION;  Surgeon: Lanis Fonda BRAVO, MD;  Location: [facility] OR;  Service: Vascular;  Laterality: Right;  PERIPHERAL NERVE BLOCK   FISTULOGRAM Left 04/07/2016   Procedure: FISTULOGRAM;  Surgeon: Penne Lonni Colorado, MD;  Location: [facility] OR;  Service: Vascular;  Laterality: Left;   FRACTURE SURGERY     right ankle   GIVENS CAPSULE STUDY N/A 10/29/2017   Procedure: GIVENS CAPSULE STUDY;  Surgeon: Dianna Specking, MD;  Location: [facility] ENDOSCOPY;  Service: Endoscopy;  Laterality: N/A;   GIVENS CAPSULE STUDY N/A 12/24/2017   Procedure: GIVENS CAPSULE STUDY;  Surgeon: Wilhelmenia Aloha Raddle., MD;  Location: [facility] ENDOSCOPY;  Service: Gastroenterology;  Laterality: N/A;   GIVENS CAPSULE STUDY N/A 05/22/2018   Procedure: GIVENS CAPSULE STUDY;  Surgeon: Wilhelmenia Aloha Raddle., MD;  Location: [facility] ENDOSCOPY;  Service: Gastroenterology;  Laterality: N/A;   INSERTION OF DIALYSIS CATHETER Right 04/07/2016   Procedure: INSERTION OF DIALYSIS CATHETER;  Surgeon: Penne Lonni [place], MD;  Location: [facility] OR;  Service: Vascular;  Laterality: Right;   INSERTION OF DIALYSIS CATHETER Right 04/04/2017   Procedure: INSERTION OF DIALYSIS CATHETER;  Surgeon: [place] Penne Lonni, MD;   Location: [facility] OR;  Service: Vascular;  Laterality: Right;   IR FLUORO GUIDE CV LINE LEFT  03/13/2020   IR US  GUIDE VASC ACCESS LEFT  03/13/2020   LEFT HEART CATH AND CORONARY ANGIOGRAPHY N/A 05/12/2019   Procedure: LEFT HEART CATH AND CORONARY ANGIOGRAPHY;  Surgeon: Claudene Pacific, MD;  [facility]: [facility] INVASIVE CV LAB;  Service: Cardiovascular;  Laterality: N/A;   LIGATION OF ARTERIOVENOUS  FISTULA Left 04/14/2016   Procedure: LIGATION OF ARTERIOVENOUS  FISTULA;  Surgeon: Lonni GORMAN Blade, MD;  Location: [facility] OR;  Service: Vascular;  Laterality: Left;   LIGATION OF COMPETING BRANCHES OF ARTERIOVENOUS FISTULA Right 05/17/2020   Procedure: LIGATION OF COMPETING BRANCHES OF RIGHT ARM ARTERIOVENOUS FISTULA;  Surgeon: Serene Gaile ORN, MD;  Location: [facility] OR;  Service: Vascular;  Laterality: Right;   PATCH ANGIOPLASTY Left 06/19/2016   Procedure: PATCH ANGIOPLASTY LEFT ARTERIOVENOUS FISTULA;  Surgeon: Blade Lonni GORMAN, MD;  Location: [facility] OR;  Service: Vascular;  Laterality: Left;   POLYPECTOMY  09/04/2017   Procedure: POLYPECTOMY;  Surgeon: Saintclair Jasper, MD;  Location: [facility] ENDOSCOPY;  Service: Gastroenterology;;   REVISON OF ARTERIOVENOUS FISTULA Left  06/19/2016   Procedure: REVISION SUPERFICIALIZATION OF BRACHIOCEPHALIC ARTERIOVENOUS FISTULA;  Surgeon: Eliza Lonni RAMAN, MD;  Location: [facility] OR;  Service: Vascular;  Laterality: Left;   UPPER EXTREMITY VENOGRAPHY Bilateral 02/19/2021   Procedure: UPPER EXTREMITY VENOGRAPHY;  Surgeon: Lanis Fonda BRAVO, MD;  Location: [facility] INVASIVE CV LAB;  Service: Cardiovascular;  Laterality: Bilateral;   Patient Active Problem List   Diagnosis Date Noted   Gastroparesis 12/31/2022   OSA on CPAP 12/31/2022   Rectal bleeding 10/23/2022   Acute on chronic anemia 10/19/2022   Obesity (BMI 30-39.9) 08/25/2022   Acute encephalopathy 08/24/2022   (HFpEF) heart failure with preserved ejection fraction (HCC) 08/24/2022   Acute respiratory distress 08/11/2022   Pneumonia due to  COVID-19 virus 08/10/2022   Facial twitching 08/09/2022   CAP (community acquired pneumonia) 07/21/2022   Acute pulmonary edema (HCC) 07/20/2022   Hypervolemia associated with renal insufficiency 05/11/2022   Hyperkalemia 05/11/2022   Restless leg syndrome 04/23/2022   Anaphylactic shock, unspecified, initial encounter 09/07/2021   Gout 08/21/2021   GERD without esophagitis 08/21/2021   Anxiety and depression 08/21/2021   Neuropathic pain 05/26/2021   Dystonia 01/13/2021   Polyneuropathy associated with underlying disease (HCC) 01/13/2021   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   Other specified complication of vascular prosthetic devices, implants and grafts, initial encounter (HCC) 09/03/2020   Contact with and (suspected) exposure to covid-19 08/13/2020   Altered mental status, unspecified 06/25/2020   Other irregular eye movements 06/25/2020   PD catheter dysfunction (HCC) 04/26/2020   Other disorders of phosphorus metabolism 04/17/2020   Hypercalcemia 04/15/2020   Impacted cerumen of both ears 04/05/2020   Chronic gouty arthritis 04/05/2020   GAD (generalized anxiety disorder) 04/05/2020   History of hypertension 04/05/2020   Peripheral neuropathy 04/05/2020   Primary insomnia 04/05/2020   Malnutrition of moderate degree 03/13/2020   Hypertensive urgency 03/11/2020   Myoclonia 03/11/2020   Abscess of vulva 02/19/2020   Pericolonic abscess 02/06/2020   Chest pain 05/10/2019   Anemia 04/25/2019   SVT (supraventricular tachycardia) (HCC) 04/25/2019   Elevated troponin 04/25/2019   ESRD on dialysis (HCC) 04/25/2019   Bacterial infection, unspecified 04/17/2019   Pain, unspecified 04/17/2019   Pruritus, unspecified 04/17/2019   Benign paroxysmal positional vertigo of left ear 08/17/2018   Acute on chronic blood loss anemia 05/21/2018   Lower GI bleed 05/20/2018   Symptomatic anemia 10/28/2017   Acidosis 05/24/2017   Chills (without fever) 05/24/2017   Coagulation defect, unspecified  (HCC) 05/24/2017   Dependence on renal dialysis (HCC) 05/24/2017   Encounter for fitting and adjustment of extracorporeal dialysis catheter (HCC) 05/24/2017   Fluid overload, unspecified 05/24/2017   Iron  deficiency anemia, unspecified 05/24/2017   Liver disease, unspecified 05/24/2017   Other disorders of electrolyte and fluid balance, not elsewhere classified 05/24/2017   Other disorders resulting from impaired renal tubular function 05/24/2017   Other long term (current) drug therapy 05/24/2017   Secondary hyperparathyroidism of renal origin (HCC) 05/24/2017   Unspecified abnormal findings in urine 05/24/2017   Unspecified jaundice 05/24/2017   Chronic kidney disease 05/24/2017   Type 2 diabetes mellitus with other diabetic kidney complication (HCC) 05/24/2017   ESRD on peritoneal dialysis (HCC) 04/01/2016   Anemia due to chronic kidney disease 04/01/2016   GERD (gastroesophageal reflux disease) 04/01/2016   Atypical chest pain    Nephrotic syndrome 10/02/2013   Dyslipidemia 10/02/2013   PAT (paroxysmal atrial tachycardia) (HCC) 09/29/2013   Normocytic anemia 06/29/2013   Glomerulonephritis 01/28/2013   Dyspnea 07/04/2011  Morbid obesity (HCC) 07/04/2011   Essential hypertension 07/04/2011   Non-insulin  dependent type 2 diabetes mellitus (HCC) 07/04/2011   Hypercholesterolemia 07/04/2011    PCP: Jesus Griffes, MD   REFERRING PROVIDER: Jesus Griffes, MD   REFERRING DIAG: [phone] (ICD-10-CM) - Other specified enthesopathies of right lower limb, excluding foot   THERAPY DIAG:  Pain in right leg  Difficulty in walking, not elsewhere classified  Localized edema  Rationale for Evaluation and Treatment: Rehabilitation  ONSET DATE: 01/31/23  SUBJECTIVE:   SUBJECTIVE STATEMENT: Right leg 4/10.   EVAL: Pt reports she has chronic R leg pain, more lateral and on the front and back of her R thigh. She experiences burning, swelling and sharp pain. Pt also has  minimal low back pain on the R and neuropathy of both feet. 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  PERTINENT HISTORY: Arthritis, CHF, anxiety, ESRD, DM2, neuropathy, high BMI  PAIN:  Are you having pain? Yes: NPRS scale: currently 4/10. Pain range on eval 6-10/10 Pain location: on the lateral ant and post aspects of the R leg Pain description: Burning, sharp, swelling Aggravating factors: Prolonged time on feet, lying on R side Relieving factors: Massager, rest  PRECAUTIONS: None  RED FLAGS: None   WEIGHT BEARING RESTRICTIONS: No  FALLS:  Has patient fallen in last 6 months? No  LIVING ENVIRONMENT: Lives with: lives with their family Lives in: House/apartment Stairs: No Able to access home  OCCUPATION: Disability  PLOF: Independent  PATIENT GOALS: Pain relief  NEXT MD VISIT: 07/1023  OBJECTIVE:  Note: Objective measures were completed at Evaluation unless otherwise noted.  DIAGNOSTIC FINDINGS:   MRI Findings: There is focal fat deposition/small intramuscular lipoma within the proximal right adductor magnus (7:29). Mild fatty atrophy of the visualized right thigh musculature. Insertional gluteus minimus and medius tendinosis. Right common hamstring origin tendinosis. Mild intramuscular edema within the quadratus femoris with narrowed ischiofemoral interval. Tendinosis of the distal obturator externus. The hip and knee are approximated on the large field-of-view images. Degenerative subchondral signal changes with cartilage thinning at the knee. Trace right knee effusion. The neurovascular bundles are normal in course and contour. There is mild subcutaneous edema in the superficial soft tissues of the lateral right hip. No suspicious enhancing soft tissue or osseous lesion within the right thigh. No rim-enhancing fluid collection. Partially visualized postsurgical changes in the right pelvis.   PATIENT SURVEYS:  LEFS: 31/80=  39%  COGNITION: Overall cognitive status: Within functional limits for tasks assessed     SENSATION: WFL  EDEMA:  Swelling of the R thigh  MUSCLE LENGTH: Hamstrings: Right limited due to pain deg; Left WNLs deg Debby test: Right WNLs deg; Left WNLs deg  POSTURE: rounded shoulders, forward head, and flexed trunk   PALPATION: Markedly TTP to the [place] R thigh, R greater trochanter area, post. thigh T LOWER EXTREMITY ROM:  Active ROM Right eval Left eval  Hip flexion 70 pain   Hip extension    Hip abduction 25 pain   Hip adduction    Hip internal rotation    Hip external rotation 20 pain   Knee flexion    Knee extension    Ankle dorsiflexion    Ankle plantarflexion    Ankle inversion    Ankle eversion     (Blank rows = not tested)  LOWER EXTREMITY MMT:  All MMT for the R leg were limited by pain MMT Right eval Left eval  Hip flexion 2  Hip extension 2   Hip abduction 2   Hip adduction 2   Hip internal rotation 2   Hip external rotation 2   Knee flexion 3   Knee extension 3   Ankle dorsiflexion 3   Ankle plantarflexion 2   Ankle inversion    Ankle eversion     (Blank rows = not tested)  LOWER EXTREMITY SPECIAL TESTS:  NT  FUNCTIONAL TESTS:  5 times sit to stand: TBA 2 minute walk test: TBA 07/15/23 14.9 sec  5 x STS  with UE 356 feet 2 MWT  GAIT: Distance walked: 192ft Assistive device utilized: None Level of assistance: Complete Independence Comments: Antalgic gait pattern                                                                                                                                TREATMENT DATE:   [facility] Adult PT Treatment:                                                DATE: 07/22/23 Therapeutic Exercise: Nustep  LAQ- full ext 10 x 2  Knee flexion GTB 10 x 2  SLR x 5 - mid range Quad set x 10 Supine heel slide x 10  Ball squeeze S/L clam AROM Mini bridge- gluteal sets in hook lying x 10  LTR   Modalities: HMP  right thigh     [facility] Adult PT Treatment:                                                DATE: 07/21/23 Therapeutic Exercise: 1 set each Quad set x10 5 Heel slides x10 Hamstring set x10 5 Hip abduction iso GTB at ankle supine - 2 pillows under knees for comfort  Hip clam iso GTB at knee x10 5 Supine gluteal sets x10 5  SAQ x10 partial x10 Updated HEP Modalities: Ice pack 10 minutes right thigh and lateral hip supine with small bolster  [facility] Adult PT Treatment:                                                DATE: 07/15/23 Therapeutic Exercise: 1 set each Quad set  Heel slides Hamstring set Hip abduction iso GTB at ankle supine - 2 pillows under knees for comfort  Supine gluteal sets- HEP  Therapeutic Activity: 14.9 sec  5 x STS  with UE 356 feet 2 MWT Modalities: Ice pack 8 minutes right thigh supine with small bolster concurrent with self care Self Care:  Frequent  cold pack applications to reduce edema Avoidance of pain aggravation with HEP      [facility] Adult PT Treatment:                                                DATE: 07/13/23 Therapeutic Exercise: Developed, instructed in, and pt completed therex as noted in HEP  Self Care: Elevation and ice packs for pain and swelling   PATIENT EDUCATION:  Education details: Eval findings, POC, HEP, self care  Person educated: Patient Education method: Explanation, Demonstration, Tactile cues, Verbal cues, and Handouts Education comprehension: verbalized understanding, returned demonstration, verbal cues required, and tactile cues required  HOME EXERCISE PROGRAM: Access Code: [email or web address] URL: [email or web address] Date: 07/21/2023 Prepared by: Dasie Daft  Exercises - Supine Quad Set  - 1 x daily - 7 x weekly - 1 sets - 10 reps - 5 hold - Supine Heel Slides  - 1 x daily - 7 x weekly - 1 sets - 10 reps - 3 hold - Supine Isometric Hamstring Set  - 1 x daily - 7 x weekly - 1 sets - 10 reps - 5 hold - Supine Hip  Abduction with Resistance at Ankles  - 1 x daily - 7 x weekly - 1 sets - 10 reps - 5 hold - Supine Gluteal Sets  - 1 x daily - 7 x weekly - 1 sets - 10 reps - 5 hold - Hooklying Bilateral Isometric Clamshell  - 1 x daily - 7 x weekly - 1 sets - 10 reps - 5 hold - Supine Knee Extension Strengthening  - 1 x daily - 7 x weekly - 1 sets - 10 reps - 5 hold  ASSESSMENT:  CLINICAL IMPRESSION: PT was completed for strengthening, progressing to primarily AROM movements . Pt is now able to complete a full LAQ and mid range SLR.   Pt tolerated the prescribed exs today without adverse effects. Pt will continue to benefit from skilled PT to address impairments for improved function.  EVAL :Patient is a 48 y.o. female who was seen today for physical therapy evaluation and treatment for M76.891 (ICD-10-CM) - Other specified enthesopathies of right lower limb, excluding foot. Pt presents to PT with decreased R LE AROM, strength, and function consistent with the referring Dx. Per LEFS, pt's rates the functional use of the R LE as low, 39%. Pt will benefit from skilled PT to address impairments to optimize function with less pain.  OBJECTIVE IMPAIRMENTS: decreased activity tolerance, decreased mobility, difficulty walking, decreased ROM, decreased strength, increased edema, obesity, and pain.   ACTIVITY LIMITATIONS: carrying, lifting, bending, sitting, standing, squatting, sleeping, stairs, transfers, bed mobility, bathing, toileting, dressing, locomotion level, and caring for others  PARTICIPATION LIMITATIONS: meal prep, cleaning, laundry, driving, shopping, and community activity  PERSONAL FACTORS: Fitness, Past/current experiences, Time since onset of injury/illness/exacerbation, and 3+ comorbidities: Arthritis, CHF, anxiety, ESRD, DM2, neuropathy, high BMI are also affecting patient's functional outcome.   REHAB POTENTIAL: Good  CLINICAL DECISION MAKING: Evolving/moderate complexity  EVALUATION COMPLEXITY:  Moderate   GOALS:   SHORT TERM GOALS: Target date: 07/30/23 Pt will be Ind in an initial HEP  Baseline: started Goal status: ONGOING  2.  Pt will voice understanding of measures to assist in pain reduction  Baseline: started Goal status: ONGOING  3.  Increase R hip flexion AROM to 90d for improved  function and as indication of improved pain Baseline:  Goal status: INITIAL  LONG TERM GOALS: Target date: 09/03/23  Pt will be Ind in a final HEP to maintain achieved LOF  Baseline:  Goal status: INITIAL  2.  Increased R hip AROM to flexion 100d, ER 35d, and abd to 40d for improved function with a normalized gait pattern and as indication of improved pain. Baseline: 70, 20, and 25 respectively Goal status: INITIAL  3.  Increase R hip strength to 3/5 and knee strength to 4/5 for improved functional use of the R LE with a normalized gait pattern Baseline:  Goal status: INITIAL  4.  Improve 5xSTS by MCID of 5 and by MCID of 67ft as indication of improved functional mobility  Baseline: TBA 7/10/5: 356 feet and 14.9 sec using UE Goal status: INITIAL  5.  Pt's LEFS score will improve by the MCID to 54% as indication of improved function  Baseline: 39% Goal status: INITIAL   PLAN:  PT FREQUENCY: 2x/week  PT DURATION: 6 weeks  PLANNED INTERVENTIONS: 97164- PT Re-evaluation, 97110-Therapeutic exercises, 97530- Therapeutic activity, 97112- Neuromuscular re-education, 97535- Self Care, [id number]- Manual therapy, [id number]- Gait training, [phone]- Aquatic Therapy, [phone]- Electrical stimulation (unattended), 97016- Vasopneumatic device, 20560 (1-2 muscles), 20561 (3+ muscles)- Dry Needling, Patient/Family education, Balance training, Stair training, Taping, Joint mobilization, Cryotherapy, and Moist heat  PLAN FOR NEXT SESSION: ; assess response to HEP; progress therex as indicated; use of modalities, manual therapy; and TPDN as indicated.   Harlene Persons, PTA 07/22/23 8:55 AM Phone:  [phone] Fax: [phone]

## 2023-07-22 NOTE — Telephone Encounter (Signed)
 Mychart message sent.

## 2023-07-23 DIAGNOSIS — N186 End stage renal disease: Secondary | ICD-10-CM | POA: Diagnosis not present

## 2023-07-23 DIAGNOSIS — F5101 Primary insomnia: Secondary | ICD-10-CM | POA: Diagnosis not present

## 2023-07-23 DIAGNOSIS — G63 Polyneuropathy in diseases classified elsewhere: Secondary | ICD-10-CM | POA: Diagnosis not present

## 2023-07-27 ENCOUNTER — Encounter: Payer: Self-pay | Admitting: Physical Therapy

## 2023-07-27 ENCOUNTER — Ambulatory Visit: Admitting: Physical Therapy

## 2023-07-27 DIAGNOSIS — M79604 Pain in right leg: Secondary | ICD-10-CM | POA: Diagnosis not present

## 2023-07-27 DIAGNOSIS — R600 Localized edema: Secondary | ICD-10-CM

## 2023-07-27 DIAGNOSIS — R262 Difficulty in walking, not elsewhere classified: Secondary | ICD-10-CM

## 2023-07-27 NOTE — Therapy (Signed)
 [facility] LOWER EXTREMITY TREATMENT    [name] MRN: [id number] DOB:11/18/1975, 48 y.o., female Today's Date: 07/27/2023  END OF SESSION:  PT End of Session - 07/27/23 1110     Visit Number 5    Number of Visits 13    Date for PT Re-Evaluation 09/03/23    Authorization Type [employer] DUAL COMPLETE    Authorization - Visit Number 5    Authorization - Number of Visits 27    PT Start Time 1105    PT Stop Time 1143    PT Time Calculation (min) 38 min            Past Medical History:  Diagnosis Date   Acid reflux    Anemia 04/2019   REQUIRING BLOOD TRANSFUSIONS   Anxiety    Arthritis    CHF (congestive heart failure) (HCC)    Dyspnea    with exertion   Dysrhythmia    tachycardia   ESRD (end stage renal disease) (HCC)    [facility] [place]   Facial twitching 11/2020   mouth twitch, eyes roll back, neurology started patient on low dose   Sinemet  IR.   GI bleeding 12/2017   Gout    Headache(784.0)    related to chemo; sometimes weekly (09/12/2013)   Heart murmur    Systolic per Dr Salena Negri' notes   High cholesterol    History of blood transfusion a couple   related to low counts   Hypertension    Iron  deficiency anemia    get epogen shots q month (02/20/2014)   MCGN (minimal change glomerulonephritis)    using chemo to tx;  finished my last tx in 12/2013   Neuropathy    Peritoneal dialysis status (HCC)    Stroke (HCC) 08/15/2017   ruled out that it was not a stroke   Past Surgical History:  Procedure Laterality Date   ABDOMINAL HYSTERECTOMY  2010   laparoscopic   ANKLE FRACTURE SURGERY Right 1994   AV FISTULA PLACEMENT Left 04/14/2016   Procedure: ARTERIOVENOUS (AV) FISTULA CREATION;  Surgeon: Lonni GORMAN Blade, MD;  Location: [facility] OR;  Service: Vascular;  Laterality: Left;   AV FISTULA PLACEMENT Left 08/09/2017   Procedure: INSERTION OF ARTERIOVENOUS (AV) GORE-TEX GRAFT LEFT ARM;   Surgeon: Harvey Carlin BRAVO, MD;  Location: [facility] OR;  Service: Vascular;  Laterality: Left;   AV FISTULA PLACEMENT Right 03/15/2020   Procedure: RIGHT ARM ARTERIOVENOUS (AV) FISTULA CREATION 1st Stage Basilic Transposition;  Surgeon: Serene Gaile ORN, MD;  Location: [facility] OR;  Service: Vascular;  Laterality: Right;   AV FISTULA PLACEMENT Right 02/24/2021   Procedure: RIGHT ARM ARTERIOVENOUS  FISTULA CREATION;  Surgeon: Lanis Fonda BRAVO, MD;  Location: [facility] OR;  Service: Vascular;  Laterality: Right;  regional block   AVGG REMOVAL Left 08/11/2017   Procedure: REMOVAL OF ARTERIOVENOUS GORETEX GRAFT (AVGG);  Surgeon: Serene Gaile ORN, MD;  Location: [facility] OR;  Service: Vascular;  Laterality: Left;   BASCILIC VEIN TRANSPOSITION Right 05/17/2020   Procedure: RIGHT UPPER EXTREMITY SECOND STAGE BASCILIC VEIN TRANSPOSITION;  Surgeon: Serene Gaile ORN, MD;  Location: [facility] OR;  Service: Vascular;  Laterality: Right;   BIOPSY  09/03/2017   Procedure: BIOPSY;  Surgeon: Elicia Claw, MD;  Location: [facility] ENDOSCOPY;  Service: Gastroenterology;;   BIOPSY  12/24/2017   Procedure: BIOPSY;  Surgeon: Wilhelmenia Aloha Raddle., MD;  Location: [facility] ENDOSCOPY;  Service: Gastroenterology;;   CARDIAC CATHETERIZATION  2000's   COLONOSCOPY N/A 10/22/2022  Procedure: COLONOSCOPY;  Surgeon: Maryruth Ole DASEN, MD;  Location: [facility] ENDOSCOPY;  Service: Endoscopy;  Laterality: N/A;   COLONOSCOPY WITH PROPOFOL  N/A 09/04/2017   Procedure: COLONOSCOPY WITH PROPOFOL ;  Surgeon: Saintclair Jasper, MD;  Location: [facility] ENDOSCOPY;  Service: Gastroenterology;  Laterality: N/A;   ENTEROSCOPY N/A 12/24/2017   Procedure: ENTEROSCOPY;  Surgeon: Wilhelmenia Aloha Raddle., MD;  Location: [facility] ENDOSCOPY;  Service: Gastroenterology;  Laterality: N/A;   ESOPHAGOGASTRODUODENOSCOPY     ESOPHAGOGASTRODUODENOSCOPY (EGD) WITH PROPOFOL  Left 06/04/2017   Procedure: ESOPHAGOGASTRODUODENOSCOPY (EGD) WITH PROPOFOL ;  Surgeon: Burnette Fallow, MD;  Location: [facility] ENDOSCOPY;  Service:  Endoscopy;  Laterality: Left;   ESOPHAGOGASTRODUODENOSCOPY (EGD) WITH PROPOFOL  N/A 09/03/2017   Procedure: ESOPHAGOGASTRODUODENOSCOPY (EGD) WITH PROPOFOL ;  Surgeon: Elicia Claw, MD;  Location: [facility] ENDOSCOPY;  Service: Gastroenterology;  Laterality: N/A;   ESOPHAGOGASTRODUODENOSCOPY (EGD) WITH PROPOFOL  N/A 04/27/2022   Procedure: ESOPHAGOGASTRODUODENOSCOPY (EGD) WITH PROPOFOL ;  Surgeon: Maryruth Ole DASEN, MD;  Location: [facility] ENDOSCOPY;  Service: Endoscopy;  Laterality: N/A;   FISTULA SUPERFICIALIZATION Left 04/02/2017   Procedure: FISTULA SUPERFICIALIZATION LEFT ARM;  Surgeon: Serene Gaile ORN, MD;  Location: [facility] OR;  Service: Vascular;  Laterality: Left;   FISTULA SUPERFICIALIZATION Right 04/14/2021   Procedure: RIGHT ARM FISTULA SUPERFICIALIZATION;  Surgeon: Lanis Fonda BRAVO, MD;  Location: [facility] OR;  Service: Vascular;  Laterality: Right;  PERIPHERAL NERVE BLOCK   FISTULOGRAM Left 04/07/2016   Procedure: FISTULOGRAM;  Surgeon: Penne Lonni [place], MD;  Location: [facility] OR;  Service: Vascular;  Laterality: Left;   FRACTURE SURGERY     right ankle   GIVENS CAPSULE STUDY N/A 10/29/2017   Procedure: GIVENS CAPSULE STUDY;  Surgeon: Dianna Specking, MD;  Location: [facility] ENDOSCOPY;  Service: Endoscopy;  Laterality: N/A;   GIVENS CAPSULE STUDY N/A 12/24/2017   Procedure: GIVENS CAPSULE STUDY;  Surgeon: Wilhelmenia Aloha Raddle., MD;  Location: [facility] ENDOSCOPY;  Service: Gastroenterology;  Laterality: N/A;   GIVENS CAPSULE STUDY N/A 05/22/2018   Procedure: GIVENS CAPSULE STUDY;  Surgeon: Wilhelmenia Aloha Raddle., MD;  Location: [facility] ENDOSCOPY;  Service: Gastroenterology;  Laterality: N/A;   INSERTION OF DIALYSIS CATHETER Right 04/07/2016   Procedure: INSERTION OF DIALYSIS CATHETER;  Surgeon: Penne Lonni [place], MD;  Location: [facility] OR;  Service: Vascular;  Laterality: Right;   INSERTION OF DIALYSIS CATHETER Right 04/04/2017   Procedure: INSERTION OF DIALYSIS CATHETER;  Surgeon: [place] Penne Lonni, MD;   Location: [facility] OR;  Service: Vascular;  Laterality: Right;   IR FLUORO GUIDE CV LINE LEFT  03/13/2020   IR US  GUIDE VASC ACCESS LEFT  03/13/2020   LEFT HEART CATH AND CORONARY ANGIOGRAPHY N/A 05/12/2019   Procedure: LEFT HEART CATH AND CORONARY ANGIOGRAPHY;  Surgeon: Claudene Pacific, MD;  Location: [facility] INVASIVE CV LAB;  Service: Cardiovascular;  Laterality: N/A;   LIGATION OF ARTERIOVENOUS  FISTULA Left 04/14/2016   Procedure: LIGATION OF ARTERIOVENOUS  FISTULA;  Surgeon: Lonni GORMAN Blade, MD;  Location: [facility] OR;  Service: Vascular;  Laterality: Left;   LIGATION OF COMPETING BRANCHES OF ARTERIOVENOUS FISTULA Right 05/17/2020   Procedure: LIGATION OF COMPETING BRANCHES OF RIGHT ARM ARTERIOVENOUS FISTULA;  Surgeon: Serene Gaile ORN, MD;  Location: [facility] OR;  Service: Vascular;  Laterality: Right;   PATCH ANGIOPLASTY Left 06/19/2016   Procedure: PATCH ANGIOPLASTY LEFT ARTERIOVENOUS FISTULA;  Surgeon: Blade Lonni GORMAN, MD;  Location: [facility] OR;  Service: Vascular;  Laterality: Left;   POLYPECTOMY  09/04/2017   Procedure: POLYPECTOMY;  Surgeon: Saintclair Jasper, MD;  Location: [facility] ENDOSCOPY;  Service: Gastroenterology;;   REVISON OF ARTERIOVENOUS FISTULA Left  06/19/2016   Procedure: REVISION SUPERFICIALIZATION OF BRACHIOCEPHALIC ARTERIOVENOUS FISTULA;  Surgeon: Eliza Lonni RAMAN, MD;  Location: [facility] OR;  Service: Vascular;  Laterality: Left;   UPPER EXTREMITY VENOGRAPHY Bilateral 02/19/2021   Procedure: UPPER EXTREMITY VENOGRAPHY;  Surgeon: Lanis Fonda BRAVO, MD;  Location: [facility] INVASIVE CV LAB;  Service: Cardiovascular;  Laterality: Bilateral;   Patient Active Problem List   Diagnosis Date Noted   Gastroparesis 12/31/2022   OSA on CPAP 12/31/2022   Rectal bleeding 10/23/2022   Acute on chronic anemia 10/19/2022   Obesity (BMI 30-39.9) 08/25/2022   Acute encephalopathy 08/24/2022   (HFpEF) heart failure with preserved ejection fraction (HCC) 08/24/2022   Acute respiratory distress 08/11/2022   Pneumonia due to  COVID-19 virus 08/10/2022   Facial twitching 08/09/2022   CAP (community acquired pneumonia) 07/21/2022   Acute pulmonary edema (HCC) 07/20/2022   Hypervolemia associated with renal insufficiency 05/11/2022   Hyperkalemia 05/11/2022   Restless leg syndrome 04/23/2022   Anaphylactic shock, unspecified, initial encounter 09/07/2021   Gout 08/21/2021   GERD without esophagitis 08/21/2021   Anxiety and depression 08/21/2021   Neuropathic pain 05/26/2021   Dystonia 01/13/2021   Polyneuropathy associated with underlying disease (HCC) 01/13/2021   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   Other specified complication of vascular prosthetic devices, implants and grafts, initial encounter (HCC) 09/03/2020   Contact with and (suspected) exposure to covid-19 08/13/2020   Altered mental status, unspecified 06/25/2020   Other irregular eye movements 06/25/2020   PD catheter dysfunction (HCC) 04/26/2020   Other disorders of phosphorus metabolism 04/17/2020   Hypercalcemia 04/15/2020   Impacted cerumen of both ears 04/05/2020   Chronic gouty arthritis 04/05/2020   GAD (generalized anxiety disorder) 04/05/2020   History of hypertension 04/05/2020   Peripheral neuropathy 04/05/2020   Primary insomnia 04/05/2020   Malnutrition of moderate degree 03/13/2020   Hypertensive urgency 03/11/2020   Myoclonia 03/11/2020   Abscess of vulva 02/19/2020   Pericolonic abscess 02/06/2020   Chest pain 05/10/2019   Anemia 04/25/2019   SVT (supraventricular tachycardia) (HCC) 04/25/2019   Elevated troponin 04/25/2019   ESRD on dialysis (HCC) 04/25/2019   Bacterial infection, unspecified 04/17/2019   Pain, unspecified 04/17/2019   Pruritus, unspecified 04/17/2019   Benign paroxysmal positional vertigo of left ear 08/17/2018   Acute on chronic blood loss anemia 05/21/2018   Lower GI bleed 05/20/2018   Symptomatic anemia 10/28/2017   Acidosis 05/24/2017   Chills (without fever) 05/24/2017   Coagulation defect, unspecified  (HCC) 05/24/2017   Dependence on renal dialysis (HCC) 05/24/2017   Encounter for fitting and adjustment of extracorporeal dialysis catheter (HCC) 05/24/2017   Fluid overload, unspecified 05/24/2017   Iron  deficiency anemia, unspecified 05/24/2017   Liver disease, unspecified 05/24/2017   Other disorders of electrolyte and fluid balance, not elsewhere classified 05/24/2017   Other disorders resulting from impaired renal tubular function 05/24/2017   Other long term (current) drug therapy 05/24/2017   Secondary hyperparathyroidism of renal origin (HCC) 05/24/2017   Unspecified abnormal findings in urine 05/24/2017   Unspecified jaundice 05/24/2017   Chronic kidney disease 05/24/2017   Type 2 diabetes mellitus with other diabetic kidney complication (HCC) 05/24/2017   ESRD on peritoneal dialysis (HCC) 04/01/2016   Anemia due to chronic kidney disease 04/01/2016   GERD (gastroesophageal reflux disease) 04/01/2016   Atypical chest pain    Nephrotic syndrome 10/02/2013   Dyslipidemia 10/02/2013   PAT (paroxysmal atrial tachycardia) (HCC) 09/29/2013   Normocytic anemia 06/29/2013   Glomerulonephritis 01/28/2013   Dyspnea 07/04/2011  Morbid obesity (HCC) 07/04/2011   Essential hypertension 07/04/2011   Non-insulin  dependent type 2 diabetes mellitus (HCC) 07/04/2011   Hypercholesterolemia 07/04/2011    PCP: Jesus Griffes, MD   REFERRING PROVIDER: Jesus Griffes, MD   REFERRING DIAG: [id number] (ICD-10-CM) - Other specified enthesopathies of right lower limb, excluding foot   THERAPY DIAG:  Pain in right leg  Difficulty in walking, not elsewhere classified  Localized edema  Rationale for Evaluation and Treatment: Rehabilitation  ONSET DATE: 01/31/23  SUBJECTIVE:   SUBJECTIVE STATEMENT: Right leg 4/10.   EVAL: Pt reports she has chronic R leg pain, more lateral and on the front and back of her R thigh. She experiences burning, swelling and sharp pain. Pt also has  minimal low back pain on the R and neuropathy of both feet. 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  PERTINENT HISTORY: Arthritis, CHF, anxiety, ESRD, DM2, neuropathy, high BMI  PAIN:  Are you having pain? Yes: NPRS scale: currently 4/10. Pain range on eval 6-10/10 Pain location: on the lateral ant and post aspects of the R leg Pain description: Burning, sharp, swelling Aggravating factors: Prolonged time on feet, lying on R side Relieving factors: Massager, rest  PRECAUTIONS: None  RED FLAGS: None   WEIGHT BEARING RESTRICTIONS: No  FALLS:  Has patient fallen in last 6 months? No  LIVING ENVIRONMENT: Lives with: lives with their family Lives in: House/apartment Stairs: No Able to access home  OCCUPATION: Disability  PLOF: Independent  PATIENT GOALS: Pain relief  NEXT MD VISIT: 07/1023  OBJECTIVE:  Note: Objective measures were completed at Evaluation unless otherwise noted.  DIAGNOSTIC FINDINGS:   MRI Findings: There is focal fat deposition/small intramuscular lipoma within the proximal right adductor magnus (7:29). Mild fatty atrophy of the visualized right thigh musculature. Insertional gluteus minimus and medius tendinosis. Right common hamstring origin tendinosis. Mild intramuscular edema within the quadratus femoris with narrowed ischiofemoral interval. Tendinosis of the distal obturator externus. The hip and knee are approximated on the large field-of-view images. Degenerative subchondral signal changes with cartilage thinning at the knee. Trace right knee effusion. The neurovascular bundles are normal in course and contour. There is mild subcutaneous edema in the superficial soft tissues of the lateral right hip. No suspicious enhancing soft tissue or osseous lesion within the right thigh. No rim-enhancing fluid collection. Partially visualized postsurgical changes in the right pelvis.   PATIENT SURVEYS:  LEFS: 31/80=  39%  COGNITION: Overall cognitive status: Within functional limits for tasks assessed     SENSATION: WFL  EDEMA:  Swelling of the R thigh  MUSCLE LENGTH: Hamstrings: Right limited due to pain deg; Left WNLs deg Debby test: Right WNLs deg; Left WNLs deg  POSTURE: rounded shoulders, forward head, and flexed trunk   PALPATION: Markedly TTP to the Ant/lat R thigh, R greater trochanter area, post. thigh T LOWER EXTREMITY ROM:  Active ROM Right eval Left eval  Hip flexion 70 pain   Hip extension    Hip abduction 25 pain   Hip adduction    Hip internal rotation    Hip external rotation 20 pain   Knee flexion    Knee extension    Ankle dorsiflexion    Ankle plantarflexion    Ankle inversion    Ankle eversion     (Blank rows = not tested)  LOWER EXTREMITY MMT:  All MMT for the R leg were limited by pain MMT Right eval Left eval  Hip flexion 2  Hip extension 2   Hip abduction 2   Hip adduction 2   Hip internal rotation 2   Hip external rotation 2   Knee flexion 3   Knee extension 3   Ankle dorsiflexion 3   Ankle plantarflexion 2   Ankle inversion    Ankle eversion     (Blank rows = not tested)  LOWER EXTREMITY SPECIAL TESTS:  NT  FUNCTIONAL TESTS:  5 times sit to stand: TBA 2 minute walk test: TBA 07/15/23 14.9 sec  5 x STS  with UE 356 feet 2 MWT  GAIT: Distance walked: 154ft Assistive device utilized: None Level of assistance: Complete Independence Comments: Antalgic gait pattern                                                                                                                                TREATMENT DATE:   [facility] Adult PT Treatment:                                                DATE: 07/27/23 Therapeutic Exercise: Nustep L4 x 5 min LAQ- full ext 10 x 3 [place]r, 10 x 2 right  Knee flexion GTB 10 x 2 SLR left x 10  SLR right x 10  SAQ bilat 10 x 3  Mini bridge- gluteal sets in hook lying 2 x 10  Ball squeeze 2 x 10 S/L right  clam AROM 10 x 1, 10 x 5  LTR 10 x 2  Right hip flexor stretch , EOM 15 sec x 2   Modalities: Ice pack  right thigh    [facility] Adult PT Treatment:                                                DATE: 07/22/23 Therapeutic Exercise: Nustep  LAQ- full ext 10 x 2  Knee flexion GTB 10 x 2  SLR x 5 - mid range Quad set x 10 Supine heel slide x 10  Ball squeeze S/L clam AROM Mini bridge- gluteal sets in hook lying x 10  LTR   Modalities: HMP right thigh   [facility] Adult PT Treatment:                                                DATE: 07/21/23 Therapeutic Exercise: 1 set each Quad set x10 5 Heel slides x10 Hamstring set x10 5 Hip abduction iso GTB at ankle supine - 2 pillows under knees for comfort  Hip clam iso GTB at knee x10 5 Supine  gluteal sets x10 5  SAQ x10 partial x10 Updated HEP Modalities: Ice pack 10 minutes right thigh and lateral hip supine with small bolster  [facility] Adult PT Treatment:                                                DATE: 07/15/23 Therapeutic Exercise: 1 set each Quad set  Heel slides Hamstring set Hip abduction iso GTB at ankle supine - 2 pillows under knees for comfort  Supine gluteal sets- HEP  Therapeutic Activity: 14.9 sec  5 x STS  with UE 356 feet 2 MWT Modalities: Ice pack 8 minutes right thigh supine with small bolster concurrent with self care Self Care:  Frequent cold pack applications to reduce edema Avoidance of pain aggravation with HEP      [facility] Adult PT Treatment:                                                DATE: 07/13/23 Therapeutic Exercise: Developed, instructed in, and pt completed therex as noted in HEP  Self Care: Elevation and ice packs for pain and swelling   PATIENT EDUCATION:  Education details: Eval findings, POC, HEP, self care  Person educated: Patient Education method: Explanation, Demonstration, Tactile cues, Verbal cues, and Handouts Education comprehension: verbalized understanding, returned  demonstration, verbal cues required, and tactile cues required  HOME EXERCISE PROGRAM: Access Code: [email or web address] URL: [email or web address] Date: 07/21/2023 Prepared by: Dasie Daft  Exercises - Supine Quad Set  - 1 x daily - 7 x weekly - 1 sets - 10 reps - 5 hold - Supine Heel Slides  - 1 x daily - 7 x weekly - 1 sets - 10 reps - 3 hold - Supine Isometric Hamstring Set  - 1 x daily - 7 x weekly - 1 sets - 10 reps - 5 hold - Supine Hip Abduction with Resistance at Ankles  - 1 x daily - 7 x weekly - 1 sets - 10 reps - 5 hold - Supine Gluteal Sets  - 1 x daily - 7 x weekly - 1 sets - 10 reps - 5 hold - Hooklying Bilateral Isometric Clamshell  - 1 x daily - 7 x weekly - 1 sets - 10 reps - 5 hold - Supine Knee Extension Strengthening  - 1 x daily - 7 x weekly - 1 sets - 10 reps - 5 hold  ASSESSMENT:  CLINICAL IMPRESSION: PT was completed for strengthening, progressing to primarily AROM movements . Pt is now able to complete increased sets and reps of AROM movements. Added gentle EOM hip flexor stretch with good tolerance.  Pt tolerated the prescribed exs today without adverse effects, only fatigue. Pt will continue to benefit from skilled PT to address impairments for improved function.   EVAL :Patient is a 48 y.o. female who was seen today for physical therapy evaluation and treatment for M76.891 (ICD-10-CM) - Other specified enthesopathies of right lower limb, excluding foot. Pt presents to PT with decreased R LE AROM, strength, and function consistent with the referring Dx. Per LEFS, pt's rates the functional use of the R LE as low, 39%. Pt will benefit from skilled PT to address impairments to optimize function with less pain.  OBJECTIVE IMPAIRMENTS: decreased activity tolerance, decreased mobility, difficulty walking, decreased ROM, decreased strength, increased edema, obesity, and pain.   ACTIVITY LIMITATIONS: carrying, lifting, bending, sitting, standing, squatting, sleeping,  stairs, transfers, bed mobility, bathing, toileting, dressing, locomotion level, and caring for others  PARTICIPATION LIMITATIONS: meal prep, cleaning, laundry, driving, shopping, and community activity  PERSONAL FACTORS: Fitness, Past/current experiences, Time since onset of injury/illness/exacerbation, and 3+ comorbidities: Arthritis, CHF, anxiety, ESRD, DM2, neuropathy, high BMI are also affecting patient's functional outcome.   REHAB POTENTIAL: Good  CLINICAL DECISION MAKING: Evolving/moderate complexity  EVALUATION COMPLEXITY: Moderate   GOALS:   SHORT TERM GOALS: Target date: 07/30/23 Pt will be Ind in an initial HEP  Baseline: started Goal status: ONGOING  2.  Pt will voice understanding of measures to assist in pain reduction  Baseline: started Goal status: ONGOING  3.  Increase R hip flexion AROM to 90d for improved function and as indication of improved pain Baseline:  Goal status: INITIAL  LONG TERM GOALS: Target date: 09/03/23  Pt will be Ind in a final HEP to maintain achieved LOF  Baseline:  Goal status: INITIAL  2.  Increased R hip AROM to flexion 100d, ER 35d, and abd to 40d for improved function with a normalized gait pattern and as indication of improved pain. Baseline: 70, 20, and 25 respectively Goal status: INITIAL  3.  Increase R hip strength to 3/5 and knee strength to 4/5 for improved functional use of the R LE with a normalized gait pattern Baseline:  Goal status: INITIAL  4.  Improve 5xSTS by MCID of 5 and by MCID of 43ft as indication of improved functional mobility  Baseline: TBA 7/10/5: 356 feet and 14.9 sec using UE Goal status: INITIAL  5.  Pt's LEFS score will improve by the MCID to 54% as indication of improved function  Baseline: 39% Goal status: INITIAL   PLAN:  PT FREQUENCY: 2x/week  PT DURATION: 6 weeks  PLANNED INTERVENTIONS: 97164- PT Re-evaluation, 97110-Therapeutic exercises, 97530- Therapeutic activity, 97112-  Neuromuscular re-education, 97535- Self Care, 02859- Manual therapy, [id number]- Gait training, [phone]- Aquatic Therapy, [phone]- Electrical stimulation (unattended), 97016- Vasopneumatic device, 20560 (1-2 muscles), 20561 (3+ muscles)- Dry Needling, Patient/Family education, Balance training, Stair training, Taping, Joint mobilization, Cryotherapy, and Moist heat  PLAN FOR NEXT SESSION: ; assess response to HEP; progress therex as indicated; use of modalities, manual therapy; and TPDN as indicated.   Harlene Persons, PTA 07/27/23 12:49 PM Phone: [phone] Fax: [phone]

## 2023-07-28 ENCOUNTER — Ambulatory Visit: Admitting: Physical Therapy

## 2023-07-28 ENCOUNTER — Encounter: Payer: Self-pay | Admitting: Physical Therapy

## 2023-07-28 DIAGNOSIS — R262 Difficulty in walking, not elsewhere classified: Secondary | ICD-10-CM

## 2023-07-28 DIAGNOSIS — R600 Localized edema: Secondary | ICD-10-CM

## 2023-07-28 DIAGNOSIS — M79604 Pain in right leg: Secondary | ICD-10-CM | POA: Diagnosis not present

## 2023-07-28 NOTE — Therapy (Signed)
 [facility] LOWER EXTREMITY TREATMENT    [name] MRN: [id number] DOB:September 29, 1975, 48 y.o., female Today's Date: 07/28/2023  END OF SESSION:  PT End of Session - 07/28/23 1104     Visit Number 6    Number of Visits 13    Date for PT Re-Evaluation 09/03/23    Authorization Type [employer] DUAL COMPLETE    Authorization - Visit Number 6    Authorization - Number of Visits 27    PT Start Time 1101    PT Stop Time 1140    PT Time Calculation (min) 39 min            Past Medical History:  Diagnosis Date   Acid reflux    Anemia 04/2019   REQUIRING BLOOD TRANSFUSIONS   Anxiety    Arthritis    CHF (congestive heart failure) (HCC)    Dyspnea    with exertion   Dysrhythmia    tachycardia   ESRD (end stage renal disease) (HCC)    [facility] [place]   Facial twitching 11/2020   mouth twitch, eyes roll back, neurology started patient on low dose   Sinemet  IR.   GI bleeding 12/2017   Gout    Headache(784.0)    related to chemo; sometimes weekly (09/12/2013)   Heart murmur    Systolic per Dr Salena Negri' notes   High cholesterol    History of blood transfusion a couple   related to low counts   Hypertension    Iron  deficiency anemia    get epogen shots q month (02/20/2014)   MCGN (minimal change glomerulonephritis)    using chemo to tx;  finished my last tx in 12/2013   Neuropathy    Peritoneal dialysis status (HCC)    Stroke (HCC) 08/15/2017   ruled out that it was not a stroke   Past Surgical History:  Procedure Laterality Date   ABDOMINAL HYSTERECTOMY  2010   laparoscopic   ANKLE FRACTURE SURGERY Right 1994   AV FISTULA PLACEMENT Left 04/14/2016   Procedure: ARTERIOVENOUS (AV) FISTULA CREATION;  Surgeon: Lonni GORMAN Blade, MD;  Location: [facility] OR;  Service: Vascular;  Laterality: Left;   AV FISTULA PLACEMENT Left 08/09/2017   Procedure: INSERTION OF ARTERIOVENOUS (AV) GORE-TEX GRAFT LEFT ARM;   Surgeon: Harvey Carlin BRAVO, MD;  Location: [facility] OR;  Service: Vascular;  Laterality: Left;   AV FISTULA PLACEMENT Right 03/15/2020   Procedure: RIGHT ARM ARTERIOVENOUS (AV) FISTULA CREATION 1st Stage Basilic Transposition;  Surgeon: Serene Gaile ORN, MD;  Location: [facility] OR;  Service: Vascular;  Laterality: Right;   AV FISTULA PLACEMENT Right 02/24/2021   Procedure: RIGHT ARM ARTERIOVENOUS  FISTULA CREATION;  Surgeon: Lanis Fonda BRAVO, MD;  Location: [facility] OR;  Service: Vascular;  Laterality: Right;  regional block   AVGG REMOVAL Left 08/11/2017   Procedure: REMOVAL OF ARTERIOVENOUS GORETEX GRAFT (AVGG);  Surgeon: Serene Gaile ORN, MD;  Location: [facility] OR;  Service: Vascular;  Laterality: Left;   BASCILIC VEIN TRANSPOSITION Right 05/17/2020   Procedure: RIGHT UPPER EXTREMITY SECOND STAGE BASCILIC VEIN TRANSPOSITION;  Surgeon: Serene Gaile ORN, MD;  Location: [facility] OR;  Service: Vascular;  Laterality: Right;   BIOPSY  09/03/2017   Procedure: BIOPSY;  Surgeon: Elicia Claw, MD;  Location: [facility] ENDOSCOPY;  Service: Gastroenterology;;   BIOPSY  12/24/2017   Procedure: BIOPSY;  Surgeon: Wilhelmenia Aloha Raddle., MD;  Location: [facility] ENDOSCOPY;  Service: Gastroenterology;;   CARDIAC CATHETERIZATION  2000's   COLONOSCOPY N/A 10/22/2022  Procedure: COLONOSCOPY;  Surgeon: Maryruth Ole DASEN, MD;  Location: [facility] ENDOSCOPY;  Service: Endoscopy;  Laterality: N/A;   COLONOSCOPY WITH PROPOFOL  N/A 09/04/2017   Procedure: COLONOSCOPY WITH PROPOFOL ;  Surgeon: Saintclair Jasper, MD;  Location: [facility] ENDOSCOPY;  Service: Gastroenterology;  Laterality: N/A;   ENTEROSCOPY N/A 12/24/2017   Procedure: ENTEROSCOPY;  Surgeon: Wilhelmenia Aloha Raddle., MD;  Location: [facility] ENDOSCOPY;  Service: Gastroenterology;  Laterality: N/A;   ESOPHAGOGASTRODUODENOSCOPY     ESOPHAGOGASTRODUODENOSCOPY (EGD) WITH PROPOFOL  Left 06/04/2017   Procedure: ESOPHAGOGASTRODUODENOSCOPY (EGD) WITH PROPOFOL ;  Surgeon: Burnette Fallow, MD;  Location: [facility] ENDOSCOPY;  Service:  Endoscopy;  Laterality: Left;   ESOPHAGOGASTRODUODENOSCOPY (EGD) WITH PROPOFOL  N/A 09/03/2017   Procedure: ESOPHAGOGASTRODUODENOSCOPY (EGD) WITH PROPOFOL ;  Surgeon: Elicia Claw, MD;  Location: [facility] ENDOSCOPY;  Service: Gastroenterology;  Laterality: N/A;   ESOPHAGOGASTRODUODENOSCOPY (EGD) WITH PROPOFOL  N/A 04/27/2022   Procedure: ESOPHAGOGASTRODUODENOSCOPY (EGD) WITH PROPOFOL ;  Surgeon: Maryruth Ole DASEN, MD;  Location: [facility] ENDOSCOPY;  Service: Endoscopy;  Laterality: N/A;   FISTULA SUPERFICIALIZATION Left 04/02/2017   Procedure: FISTULA SUPERFICIALIZATION LEFT ARM;  Surgeon: Serene Gaile ORN, MD;  Location: [facility] OR;  Service: Vascular;  Laterality: Left;   FISTULA SUPERFICIALIZATION Right 04/14/2021   Procedure: RIGHT ARM FISTULA SUPERFICIALIZATION;  Surgeon: Lanis Fonda BRAVO, MD;  Location: [facility] OR;  Service: Vascular;  Laterality: Right;  PERIPHERAL NERVE BLOCK   FISTULOGRAM Left 04/07/2016   Procedure: FISTULOGRAM;  Surgeon: Penne Lonni Colorado, MD;  Location: [facility] OR;  Service: Vascular;  Laterality: Left;   FRACTURE SURGERY     right ankle   GIVENS CAPSULE STUDY N/A 10/29/2017   Procedure: GIVENS CAPSULE STUDY;  Surgeon: Dianna Specking, MD;  Location: [facility] ENDOSCOPY;  Service: Endoscopy;  Laterality: N/A;   GIVENS CAPSULE STUDY N/A 12/24/2017   Procedure: GIVENS CAPSULE STUDY;  Surgeon: Wilhelmenia Aloha Raddle., MD;  Location: [facility] ENDOSCOPY;  Service: Gastroenterology;  Laterality: N/A;   GIVENS CAPSULE STUDY N/A 05/22/2018   Procedure: GIVENS CAPSULE STUDY;  Surgeon: Wilhelmenia Aloha Raddle., MD;  Location: [facility] ENDOSCOPY;  Service: Gastroenterology;  Laterality: N/A;   INSERTION OF DIALYSIS CATHETER Right 04/07/2016   Procedure: INSERTION OF DIALYSIS CATHETER;  Surgeon: Penne Lonni Colorado, MD;  Location: [facility] OR;  Service: Vascular;  Laterality: Right;   INSERTION OF DIALYSIS CATHETER Right 04/04/2017   Procedure: INSERTION OF DIALYSIS CATHETER;  Surgeon: Colorado Penne Lonni, MD;   Location: [facility] OR;  Service: Vascular;  Laterality: Right;   IR FLUORO GUIDE CV LINE LEFT  03/13/2020   IR US  GUIDE VASC ACCESS LEFT  03/13/2020   LEFT HEART CATH AND CORONARY ANGIOGRAPHY N/A 05/12/2019   Procedure: LEFT HEART CATH AND CORONARY ANGIOGRAPHY;  Surgeon: Claudene Pacific, MD;  Location: [facility] INVASIVE CV LAB;  Service: Cardiovascular;  Laterality: N/A;   LIGATION OF ARTERIOVENOUS  FISTULA Left 04/14/2016   Procedure: LIGATION OF ARTERIOVENOUS  FISTULA;  Surgeon: Lonni GORMAN Blade, MD;  Location: [facility] OR;  Service: Vascular;  Laterality: Left;   LIGATION OF COMPETING BRANCHES OF ARTERIOVENOUS FISTULA Right 05/17/2020   Procedure: LIGATION OF COMPETING BRANCHES OF RIGHT ARM ARTERIOVENOUS FISTULA;  Surgeon: Serene Gaile ORN, MD;  Location: [facility] OR;  Service: Vascular;  Laterality: Right;   PATCH ANGIOPLASTY Left 06/19/2016   Procedure: PATCH ANGIOPLASTY LEFT ARTERIOVENOUS FISTULA;  Surgeon: Blade Lonni GORMAN, MD;  Location: [facility] OR;  Service: Vascular;  Laterality: Left;   POLYPECTOMY  09/04/2017   Procedure: POLYPECTOMY;  Surgeon: Saintclair Jasper, MD;  Location: [facility] ENDOSCOPY;  Service: Gastroenterology;;   REVISON OF ARTERIOVENOUS FISTULA Left  06/19/2016   Procedure: REVISION SUPERFICIALIZATION OF BRACHIOCEPHALIC ARTERIOVENOUS FISTULA;  Surgeon: Eliza Lonni RAMAN, MD;  Location: [facility] OR;  Service: Vascular;  Laterality: Left;   UPPER EXTREMITY VENOGRAPHY Bilateral 02/19/2021   Procedure: UPPER EXTREMITY VENOGRAPHY;  Surgeon: Lanis Fonda BRAVO, MD;  Location: [facility] INVASIVE CV LAB;  Service: Cardiovascular;  Laterality: Bilateral;   Patient Active Problem List   Diagnosis Date Noted   Gastroparesis 12/31/2022   OSA on CPAP 12/31/2022   Rectal bleeding 10/23/2022   Acute on chronic anemia 10/19/2022   Obesity (BMI 30-39.9) 08/25/2022   Acute encephalopathy 08/24/2022   (HFpEF) heart failure with preserved ejection fraction (HCC) 08/24/2022   Acute respiratory distress 08/11/2022   Pneumonia due to  COVID-19 virus 08/10/2022   Facial twitching 08/09/2022   CAP (community acquired pneumonia) 07/21/2022   Acute pulmonary edema (HCC) 07/20/2022   Hypervolemia associated with renal insufficiency 05/11/2022   Hyperkalemia 05/11/2022   Restless leg syndrome 04/23/2022   Anaphylactic shock, unspecified, initial encounter 09/07/2021   Gout 08/21/2021   GERD without esophagitis 08/21/2021   Anxiety and depression 08/21/2021   Neuropathic pain 05/26/2021   Dystonia 01/13/2021   Polyneuropathy associated with underlying disease (HCC) 01/13/2021   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   Other specified complication of vascular prosthetic devices, implants and grafts, initial encounter (HCC) 09/03/2020   Contact with and (suspected) exposure to covid-19 08/13/2020   Altered mental status, unspecified 06/25/2020   Other irregular eye movements 06/25/2020   PD catheter dysfunction (HCC) 04/26/2020   Other disorders of phosphorus metabolism 04/17/2020   Hypercalcemia 04/15/2020   Impacted cerumen of both ears 04/05/2020   Chronic gouty arthritis 04/05/2020   GAD (generalized anxiety disorder) 04/05/2020   History of hypertension 04/05/2020   Peripheral neuropathy 04/05/2020   Primary insomnia 04/05/2020   Malnutrition of moderate degree 03/13/2020   Hypertensive urgency 03/11/2020   Myoclonia 03/11/2020   Abscess of vulva 02/19/2020   Pericolonic abscess 02/06/2020   Chest pain 05/10/2019   Anemia 04/25/2019   SVT (supraventricular tachycardia) (HCC) 04/25/2019   Elevated troponin 04/25/2019   ESRD on dialysis (HCC) 04/25/2019   Bacterial infection, unspecified 04/17/2019   Pain, unspecified 04/17/2019   Pruritus, unspecified 04/17/2019   Benign paroxysmal positional vertigo of left ear 08/17/2018   Acute on chronic blood loss anemia 05/21/2018   Lower GI bleed 05/20/2018   Symptomatic anemia 10/28/2017   Acidosis 05/24/2017   Chills (without fever) 05/24/2017   Coagulation defect, unspecified  (HCC) 05/24/2017   Dependence on renal dialysis (HCC) 05/24/2017   Encounter for fitting and adjustment of extracorporeal dialysis catheter (HCC) 05/24/2017   Fluid overload, unspecified 05/24/2017   Iron  deficiency anemia, unspecified 05/24/2017   Liver disease, unspecified 05/24/2017   Other disorders of electrolyte and fluid balance, not elsewhere classified 05/24/2017   Other disorders resulting from impaired renal tubular function 05/24/2017   Other long term (current) drug therapy 05/24/2017   Secondary hyperparathyroidism of renal origin (HCC) 05/24/2017   Unspecified abnormal findings in urine 05/24/2017   Unspecified jaundice 05/24/2017   Chronic kidney disease 05/24/2017   Type 2 diabetes mellitus with other diabetic kidney complication (HCC) 05/24/2017   ESRD on peritoneal dialysis (HCC) 04/01/2016   Anemia due to chronic kidney disease 04/01/2016   GERD (gastroesophageal reflux disease) 04/01/2016   Atypical chest pain    Nephrotic syndrome 10/02/2013   Dyslipidemia 10/02/2013   PAT (paroxysmal atrial tachycardia) (HCC) 09/29/2013   Normocytic anemia 06/29/2013   Glomerulonephritis 01/28/2013   Dyspnea 07/04/2011  Morbid obesity (HCC) 07/04/2011   Essential hypertension 07/04/2011   Non-insulin  dependent type 2 diabetes mellitus (HCC) 07/04/2011   Hypercholesterolemia 07/04/2011    PCP: Jesus Griffes, MD   REFERRING PROVIDER: Jesus Griffes, MD   REFERRING DIAG: [phone] (ICD-10-CM) - Other specified enthesopathies of right lower limb, excluding foot   THERAPY DIAG:  Pain in right leg  Difficulty in walking, not elsewhere classified  Localized edema  Rationale for Evaluation and Treatment: Rehabilitation  ONSET DATE: 01/31/23  SUBJECTIVE:   SUBJECTIVE STATEMENT: Right leg 5/10  EVAL: Pt reports she has chronic R leg pain, more lateral and on the front and back of her R thigh. She experiences burning, swelling and sharp pain. Pt also has  minimal low back pain on the R and neuropathy of both feet. 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  PERTINENT HISTORY: Arthritis, CHF, anxiety, ESRD, DM2, neuropathy, high BMI  PAIN:  Are you having pain? Yes: NPRS scale: currently 4/10. Pain range on eval 6-10/10 Pain location: on the lateral ant and post aspects of the R leg Pain description: Burning, sharp, swelling Aggravating factors: Prolonged time on feet, lying on R side Relieving factors: Massager, rest  PRECAUTIONS: None  RED FLAGS: None   WEIGHT BEARING RESTRICTIONS: No  FALLS:  Has patient fallen in last 6 months? No  LIVING ENVIRONMENT: Lives with: lives with their family Lives in: House/apartment Stairs: No Able to access home  OCCUPATION: Disability  PLOF: Independent  PATIENT GOALS: Pain relief  NEXT MD VISIT: 07/1023  OBJECTIVE:  Note: Objective measures were completed at Evaluation unless otherwise noted.  DIAGNOSTIC FINDINGS:   MRI Findings: There is focal fat deposition/small intramuscular lipoma within the proximal right adductor magnus (7:29). Mild fatty atrophy of the visualized right thigh musculature. Insertional gluteus minimus and medius tendinosis. Right common hamstring origin tendinosis. Mild intramuscular edema within the quadratus femoris with narrowed ischiofemoral interval. Tendinosis of the distal obturator externus. The hip and knee are approximated on the large field-of-view images. Degenerative subchondral signal changes with cartilage thinning at the knee. Trace right knee effusion. The neurovascular bundles are normal in course and contour. There is mild subcutaneous edema in the superficial soft tissues of the lateral right hip. No suspicious enhancing soft tissue or osseous lesion within the right thigh. No rim-enhancing fluid collection. Partially visualized postsurgical changes in the right pelvis.   PATIENT SURVEYS:  LEFS: 31/80=  39%  COGNITION: Overall cognitive status: Within functional limits for tasks assessed     SENSATION: WFL  EDEMA:  Swelling of the R thigh  MUSCLE LENGTH: Hamstrings: Right limited due to pain deg; Left WNLs deg Debby test: Right WNLs deg; Left WNLs deg  POSTURE: rounded shoulders, forward head, and flexed trunk   PALPATION: Markedly TTP to the Ant/lat R thigh, R greater trochanter area, post. thigh T LOWER EXTREMITY ROM:  Active ROM Right eval Left eval Right 07/28/23  Hip flexion 70 pain  90+  Hip extension     Hip abduction 25 pain    Hip adduction     Hip internal rotation     Hip external rotation 20 pain    Knee flexion     Knee extension     Ankle dorsiflexion     Ankle plantarflexion     Ankle inversion     Ankle eversion      (Blank rows = not tested)  LOWER EXTREMITY MMT:  All MMT for the R leg were limited  by pain MMT Right eval Left eval  Hip flexion 2   Hip extension 2   Hip abduction 2   Hip adduction 2   Hip internal rotation 2   Hip external rotation 2   Knee flexion 3   Knee extension 3   Ankle dorsiflexion 3   Ankle plantarflexion 2   Ankle inversion    Ankle eversion     (Blank rows = not tested)  LOWER EXTREMITY SPECIAL TESTS:  NT  FUNCTIONAL TESTS:  5 times sit to stand: TBA 2 minute walk test: TBA 07/15/23 14.9 sec  5 x STS  with UE 356 feet 2 MWT  GAIT: Distance walked: 185ft Assistive device utilized: None Level of assistance: Complete Independence Comments: Antalgic gait pattern                                                                                                                                TREATMENT DATE:   [facility] Adult PT Treatment:                                                DATE: 07/28/23 Therapeutic Exercise: LAQ 2# 10 x 2  Seated hip flexion 10 x 2 2#  STS 5 x 2  T.M. 1.5 mph x 5 minutes  Seated Leg press 45# x 10  Bridge x 10  LTR 10 x 2    [facility] Adult PT Treatment:                                                 DATE: 07/27/23 Therapeutic Exercise: Nustep L4 x 5 min LAQ- full ext 10 x 3 Lefr, 10 x 2 right  Knee flexion GTB 10 x 2 SLR left x 10  SLR right x 10  SAQ bilat 10 x 3  Mini bridge- gluteal sets in hook lying 2 x 10  Ball squeeze 2 x 10 S/L right clam AROM 10 x 1, 10 x 5  LTR 10 x 2  Right hip flexor stretch , EOM 15 sec x 2   Modalities: Ice pack  right thigh    [facility] Adult PT Treatment:                                                DATE: 07/22/23 Therapeutic Exercise: Nustep  LAQ- full ext 10 x 2  Knee flexion GTB 10 x 2  SLR x 5 - mid range Quad set x 10 Supine heel slide x 10  Ball squeeze S/L clam AROM  Mini bridge- gluteal sets in hook lying x 10  LTR   Modalities: HMP right thigh   [facility] Adult PT Treatment:                                                DATE: 07/21/23 Therapeutic Exercise: 1 set each Quad set x10 5 Heel slides x10 Hamstring set x10 5 Hip abduction iso GTB at ankle supine - 2 pillows under knees for comfort  Hip clam iso GTB at knee x10 5 Supine gluteal sets x10 5  SAQ x10 partial x10 Updated HEP Modalities: Ice pack 10 minutes right thigh and lateral hip supine with small bolster  [facility] Adult PT Treatment:                                                DATE: 07/15/23 Therapeutic Exercise: 1 set each Quad set  Heel slides Hamstring set Hip abduction iso GTB at ankle supine - 2 pillows under knees for comfort  Supine gluteal sets- HEP  Therapeutic Activity: 14.9 sec  5 x STS  with UE 356 feet 2 MWT Modalities: Ice pack 8 minutes right thigh supine with small bolster concurrent with self care Self Care:  Frequent cold pack applications to reduce edema Avoidance of pain aggravation with HEP      [facility] Adult PT Treatment:                                                DATE: 07/13/23 Therapeutic Exercise: Developed, instructed in, and pt completed therex as noted in HEP  Self Care: Elevation and ice packs  for pain and swelling   PATIENT EDUCATION:  Education details: Eval findings, POC, HEP, self care  Person educated: Patient Education method: Explanation, Demonstration, Tactile cues, Verbal cues, and Handouts Education comprehension: verbalized understanding, returned demonstration, verbal cues required, and tactile cues required  HOME EXERCISE PROGRAM: Access Code: [email or web address] URL: [email or web address] Date: 07/21/2023 Prepared by: Dasie Daft  Exercises - Supine Quad Set  - 1 x daily - 7 x weekly - 1 sets - 10 reps - 5 hold - Supine Heel Slides  - 1 x daily - 7 x weekly - 1 sets - 10 reps - 3 hold - Supine Isometric Hamstring Set  - 1 x daily - 7 x weekly - 1 sets - 10 reps - 5 hold - Supine Hip Abduction with Resistance at Ankles  - 1 x daily - 7 x weekly - 1 sets - 10 reps - 5 hold - Supine Gluteal Sets  - 1 x daily - 7 x weekly - 1 sets - 10 reps - 5 hold - Hooklying Bilateral Isometric Clamshell  - 1 x daily - 7 x weekly - 1 sets - 10 reps - 5 hold - Supine Knee Extension Strengthening  - 1 x daily - 7 x weekly - 1 sets - 10 reps - 5 hold  ASSESSMENT:  CLINICAL IMPRESSION: PT was completed for strengthening and activity tolerance, progressing to Treadmill and leg press. Pt is going out of town this weekend and hopes to  be able to keep up with family.  She is interested in joining gym when it is appropriate. Will continue to introduce gym machines to prepare pt for transition to community exercise.    Pt tolerated the prescribed exs today without adverse effects, only fatigue. Pt will continue to benefit from skilled PT to address impairments for improved function.   EVAL :Patient is a 48 y.o. female who was seen today for physical therapy evaluation and treatment for M76.891 (ICD-10-CM) - Other specified enthesopathies of right lower limb, excluding foot. Pt presents to PT with decreased R LE AROM, strength, and function consistent with the referring Dx. Per LEFS, pt's  rates the functional use of the R LE as low, 39%. Pt will benefit from skilled PT to address impairments to optimize function with less pain.   OBJECTIVE IMPAIRMENTS: decreased activity tolerance, decreased mobility, difficulty walking, decreased ROM, decreased strength, increased edema, obesity, and pain.   ACTIVITY LIMITATIONS: carrying, lifting, bending, sitting, standing, squatting, sleeping, stairs, transfers, bed mobility, bathing, toileting, dressing, locomotion level, and caring for others  PARTICIPATION LIMITATIONS: meal prep, cleaning, laundry, driving, shopping, and community activity  PERSONAL FACTORS: Fitness, Past/current experiences, Time since onset of injury/illness/exacerbation, and 3+ comorbidities: Arthritis, CHF, anxiety, ESRD, DM2, neuropathy, high BMI are also affecting patient's functional outcome.   REHAB POTENTIAL: Good  CLINICAL DECISION MAKING: Evolving/moderate complexity  EVALUATION COMPLEXITY: Moderate   GOALS:   SHORT TERM GOALS: Target date: 07/30/23 Pt will be Ind in an initial HEP  Baseline: started Goal status: ONGOING  2.  Pt will voice understanding of measures to assist in pain reduction  Baseline: started Goal status: ONGOING  3.  Increase R hip flexion AROM to 90d for improved function and as indication of improved pain Baseline:  Goal status: MET   LONG TERM GOALS: Target date: 09/03/23  Pt will be Ind in a final HEP to maintain achieved LOF  Baseline:  Goal status: INITIAL  2.  Increased R hip AROM to flexion 100d, ER 35d, and abd to 40d for improved function with a normalized gait pattern and as indication of improved pain. Baseline: 70, 20, and 25 respectively Goal status: INITIAL  3.  Increase R hip strength to 3/5 and knee strength to 4/5 for improved functional use of the R LE with a normalized gait pattern Baseline:  Goal status: INITIAL  4.  Improve 5xSTS by MCID of 5 and by MCID of 62ft as indication of improved  functional mobility  Baseline: TBA 7/10/5: 356 feet and 14.9 sec using UE Goal status: INITIAL  5.  Pt's LEFS score will improve by the MCID to 54% as indication of improved function  Baseline: 39% Goal status: INITIAL   PLAN:  PT FREQUENCY: 2x/week  PT DURATION: 6 weeks  PLANNED INTERVENTIONS: 97164- PT Re-evaluation, 97110-Therapeutic exercises, 97530- Therapeutic activity, 97112- Neuromuscular re-education, 97535- Self Care, [id number]- Manual therapy, [id number]- Gait training, [phone]- Aquatic Therapy, [phone]- Electrical stimulation (unattended), 97016- Vasopneumatic device, 20560 (1-2 muscles), 20561 (3+ muscles)- Dry Needling, Patient/Family education, Balance training, Stair training, Taping, Joint mobilization, Cryotherapy, and Moist heat  PLAN FOR NEXT SESSION: ; assess response to HEP; progress therex as indicated; use of modalities, manual therapy; and TPDN as indicated.   Harlene Persons, PTA 07/28/23 1:08 PM Phone: [phone] Fax: [phone]

## 2023-07-29 ENCOUNTER — Encounter: Admitting: Physical Therapy

## 2023-08-02 ENCOUNTER — Encounter: Payer: Self-pay | Admitting: Podiatry

## 2023-08-02 ENCOUNTER — Ambulatory Visit (INDEPENDENT_AMBULATORY_CARE_PROVIDER_SITE_OTHER): Admitting: Podiatry

## 2023-08-02 VITALS — Ht 67.0 in | Wt 230.0 lb

## 2023-08-02 DIAGNOSIS — B351 Tinea unguium: Secondary | ICD-10-CM | POA: Diagnosis not present

## 2023-08-02 DIAGNOSIS — M79674 Pain in right toe(s): Secondary | ICD-10-CM | POA: Diagnosis not present

## 2023-08-02 DIAGNOSIS — M79675 Pain in left toe(s): Secondary | ICD-10-CM

## 2023-08-02 NOTE — Addendum Note (Signed)
 Addended by: JANIT THRESA HERO on: 08/02/2023 10:57 AM   Modules accepted: Level of Service

## 2023-08-02 NOTE — Progress Notes (Signed)
   Chief Complaint  Patient presents with   Callouses    Pt is here due to callous to the bottom of bilateral feet.    SUBJECTIVE Patient PMHx recent kidney transplant recipient presents to office today complaining of elongated, thickened nails that cause pain while ambulating in shoes.  Patient is unable to trim their own nails. Patient is here for further evaluation and treatment.  Past Medical History:  Diagnosis Date   Acid reflux    Anemia 04/2019   REQUIRING BLOOD TRANSFUSIONS   Anxiety    Arthritis    CHF (congestive heart failure) (HCC)    Dyspnea    with exertion   Dysrhythmia    tachycardia   ESRD (end stage renal disease) (HCC)    [facility]   Facial twitching 11/2020   mouth twitch, eyes roll back, neurology started patient on low dose   Sinemet  IR.   GI bleeding 12/2017   Gout    Headache(784.0)    related to chemo; sometimes weekly (09/12/2013)   Heart murmur    Systolic per Dr Salena Negri' notes   High cholesterol    History of blood transfusion a couple   related to low counts   Hypertension    Iron  deficiency anemia    get epogen shots q month (02/20/2014)   MCGN (minimal change glomerulonephritis)    using chemo to tx;  finished my last tx in 12/2013   Neuropathy    Peritoneal dialysis status (HCC)    Stroke (HCC) 08/15/2017   ruled out that it was not a stroke    Allergies  Allergen Reactions   Nsaids Other (See Comments)    Cannot take due to Kidney disease/Kidney function  Other Reaction(s): Unknown   Tolmetin Other (See Comments)    Cannot take due to Kidney Disease     OBJECTIVE General Patient is awake, alert, and oriented x 3 and in no acute distress. Derm Skin is dry and supple bilateral. Negative open lesions or macerations. Remaining integument unremarkable. Nails are tender, long, thickened and dystrophic with subungual debris, consistent with onychomycosis, 1-5 bilateral. No signs of infection noted. Vasc  DP and  PT pedal pulses palpable bilaterally. Temperature gradient within normal limits.  Neuro Epicritic and protective threshold sensation diminished bilaterally.  Musculoskeletal Exam No symptomatic pedal deformities noted bilateral. Muscular strength within normal limits.  ASSESSMENT 1. Diabetes Mellitus w/ peripheral neuropathy 2.  Pain due to onychomycosis of toenails bilateral  PLAN OF CARE 1. Patient evaluated today. 2. Instructed to maintain good pedal hygiene and foot care. Stressed importance of controlling blood sugar.  3. Mechanical debridement of nails 1-5 bilaterally performed using a nail nipper. Filed with dremel without incident.  4. Return to clinic in 3 mos.     Thresa EMERSON Sar, DPM [facility]  Dr. Thresa EMERSON Sar, DPM    [address] [place], [place] [address]                Office [phone]  Fax [phone]

## 2023-08-03 DIAGNOSIS — G4733 Obstructive sleep apnea (adult) (pediatric): Secondary | ICD-10-CM | POA: Diagnosis not present

## 2023-08-04 ENCOUNTER — Encounter: Payer: Self-pay | Admitting: Physical Therapy

## 2023-08-04 ENCOUNTER — Ambulatory Visit: Admitting: Physical Therapy

## 2023-08-04 DIAGNOSIS — R600 Localized edema: Secondary | ICD-10-CM | POA: Diagnosis not present

## 2023-08-04 DIAGNOSIS — M79604 Pain in right leg: Secondary | ICD-10-CM

## 2023-08-04 DIAGNOSIS — R262 Difficulty in walking, not elsewhere classified: Secondary | ICD-10-CM | POA: Diagnosis not present

## 2023-08-04 NOTE — Therapy (Signed)
 [facility] LOWER EXTREMITY TREATMENT    [name] MRN: [id number] DOB:06-21-1975, 48 y.o., female Today's Date: 08/04/2023  END OF SESSION:  PT End of Session - 08/04/23 0934     Visit Number 7    Number of Visits 13    Date for PT Re-Evaluation 09/03/23    Authorization Type [employer] DUAL COMPLETE    Authorization - Visit Number 7    Authorization - Number of Visits 27    PT Start Time 0930    PT Stop Time 1023    PT Time Calculation (min) 53 min            Past Medical History:  Diagnosis Date   Acid reflux    Anemia 04/2019   REQUIRING BLOOD TRANSFUSIONS   Anxiety    Arthritis    CHF (congestive heart failure) (HCC)    Dyspnea    with exertion   Dysrhythmia    tachycardia   ESRD (end stage renal disease) (HCC)    [facility] [place]   Facial twitching 11/2020   mouth twitch, eyes roll back, neurology started patient on low dose   Sinemet  IR.   GI bleeding 12/2017   Gout    Headache(784.0)    related to chemo; sometimes weekly (09/12/2013)   Heart murmur    Systolic per Dr Salena Negri' notes   High cholesterol    History of blood transfusion a couple   related to low counts   Hypertension    Iron  deficiency anemia    get epogen shots q month (02/20/2014)   MCGN (minimal change glomerulonephritis)    using chemo to tx;  finished my last tx in 12/2013   Neuropathy    Peritoneal dialysis status (HCC)    Stroke (HCC) 08/15/2017   ruled out that it was not a stroke   Past Surgical History:  Procedure Laterality Date   ABDOMINAL HYSTERECTOMY  2010   laparoscopic   ANKLE FRACTURE SURGERY Right 1994   AV FISTULA PLACEMENT Left 04/14/2016   Procedure: ARTERIOVENOUS (AV) FISTULA CREATION;  Surgeon: Lonni GORMAN Blade, MD;  Location: [facility] OR;  Service: Vascular;  Laterality: Left;   AV FISTULA PLACEMENT Left 08/09/2017   Procedure: INSERTION OF ARTERIOVENOUS (AV) GORE-TEX GRAFT LEFT ARM;   Surgeon: Harvey Carlin BRAVO, MD;  Location: [facility] OR;  Service: Vascular;  Laterality: Left;   AV FISTULA PLACEMENT Right 03/15/2020   Procedure: RIGHT ARM ARTERIOVENOUS (AV) FISTULA CREATION 1st Stage Basilic Transposition;  Surgeon: Serene Gaile ORN, MD;  Location: [facility] OR;  Service: Vascular;  Laterality: Right;   AV FISTULA PLACEMENT Right 02/24/2021   Procedure: RIGHT ARM ARTERIOVENOUS  FISTULA CREATION;  Surgeon: Lanis Fonda BRAVO, MD;  Location: [facility] OR;  Service: Vascular;  Laterality: Right;  regional block   AVGG REMOVAL Left 08/11/2017   Procedure: REMOVAL OF ARTERIOVENOUS GORETEX GRAFT (AVGG);  Surgeon: Serene Gaile ORN, MD;  Location: [facility] OR;  Service: Vascular;  Laterality: Left;   BASCILIC VEIN TRANSPOSITION Right 05/17/2020   Procedure: RIGHT UPPER EXTREMITY SECOND STAGE BASCILIC VEIN TRANSPOSITION;  Surgeon: Serene Gaile ORN, MD;  Location: [facility] OR;  Service: Vascular;  Laterality: Right;   BIOPSY  09/03/2017   Procedure: BIOPSY;  Surgeon: Elicia Claw, MD;  Location: [facility] ENDOSCOPY;  Service: Gastroenterology;;   BIOPSY  12/24/2017   Procedure: BIOPSY;  Surgeon: Wilhelmenia Aloha Raddle., MD;  Location: [facility] ENDOSCOPY;  Service: Gastroenterology;;   CARDIAC CATHETERIZATION  2000's   COLONOSCOPY N/A 10/22/2022  Procedure: COLONOSCOPY;  Surgeon: Maryruth Ole DASEN, MD;  Location: [facility] ENDOSCOPY;  Service: Endoscopy;  Laterality: N/A;   COLONOSCOPY WITH PROPOFOL  N/A 09/04/2017   Procedure: COLONOSCOPY WITH PROPOFOL ;  Surgeon: Saintclair Jasper, MD;  Location: [facility] ENDOSCOPY;  Service: Gastroenterology;  Laterality: N/A;   ENTEROSCOPY N/A 12/24/2017   Procedure: ENTEROSCOPY;  Surgeon: Wilhelmenia Aloha Raddle., MD;  Location: [facility] ENDOSCOPY;  Service: Gastroenterology;  Laterality: N/A;   ESOPHAGOGASTRODUODENOSCOPY     ESOPHAGOGASTRODUODENOSCOPY (EGD) WITH PROPOFOL  Left 06/04/2017   Procedure: ESOPHAGOGASTRODUODENOSCOPY (EGD) WITH PROPOFOL ;  Surgeon: Burnette Fallow, MD;  Location: [facility] ENDOSCOPY;  Service:  Endoscopy;  Laterality: Left;   ESOPHAGOGASTRODUODENOSCOPY (EGD) WITH PROPOFOL  N/A 09/03/2017   Procedure: ESOPHAGOGASTRODUODENOSCOPY (EGD) WITH PROPOFOL ;  Surgeon: Elicia Claw, MD;  Location: [facility] ENDOSCOPY;  Service: Gastroenterology;  Laterality: N/A;   ESOPHAGOGASTRODUODENOSCOPY (EGD) WITH PROPOFOL  N/A 04/27/2022   Procedure: ESOPHAGOGASTRODUODENOSCOPY (EGD) WITH PROPOFOL ;  Surgeon: Maryruth Ole DASEN, MD;  Location: [facility] ENDOSCOPY;  Service: Endoscopy;  Laterality: N/A;   FISTULA SUPERFICIALIZATION Left 04/02/2017   Procedure: FISTULA SUPERFICIALIZATION LEFT ARM;  Surgeon: Serene Gaile ORN, MD;  Location: [facility] OR;  Service: Vascular;  Laterality: Left;   FISTULA SUPERFICIALIZATION Right 04/14/2021   Procedure: RIGHT ARM FISTULA SUPERFICIALIZATION;  Surgeon: Lanis Fonda BRAVO, MD;  Location: [facility] OR;  Service: Vascular;  Laterality: Right;  PERIPHERAL NERVE BLOCK   FISTULOGRAM Left 04/07/2016   Procedure: FISTULOGRAM;  Surgeon: Penne Lonni Colorado, MD;  Location: [facility] OR;  Service: Vascular;  Laterality: Left;   FRACTURE SURGERY     right ankle   GIVENS CAPSULE STUDY N/A 10/29/2017   Procedure: GIVENS CAPSULE STUDY;  Surgeon: Dianna Specking, MD;  Location: [facility] ENDOSCOPY;  Service: Endoscopy;  Laterality: N/A;   GIVENS CAPSULE STUDY N/A 12/24/2017   Procedure: GIVENS CAPSULE STUDY;  Surgeon: Wilhelmenia Aloha Raddle., MD;  Location: [facility] ENDOSCOPY;  Service: Gastroenterology;  Laterality: N/A;   GIVENS CAPSULE STUDY N/A 05/22/2018   Procedure: GIVENS CAPSULE STUDY;  Surgeon: Wilhelmenia Aloha Raddle., MD;  Location: [facility] ENDOSCOPY;  Service: Gastroenterology;  Laterality: N/A;   INSERTION OF DIALYSIS CATHETER Right 04/07/2016   Procedure: INSERTION OF DIALYSIS CATHETER;  Surgeon: Penne Lonni Colorado, MD;  Location: [facility] OR;  Service: Vascular;  Laterality: Right;   INSERTION OF DIALYSIS CATHETER Right 04/04/2017   Procedure: INSERTION OF DIALYSIS CATHETER;  Surgeon: Colorado Penne Lonni, MD;   Location: [facility] OR;  Service: Vascular;  Laterality: Right;   IR FLUORO GUIDE CV LINE LEFT  03/13/2020   IR US  GUIDE VASC ACCESS LEFT  03/13/2020   LEFT HEART CATH AND CORONARY ANGIOGRAPHY N/A 05/12/2019   Procedure: LEFT HEART CATH AND CORONARY ANGIOGRAPHY;  Surgeon: Claudene Pacific, MD;  [facility]: [facility] INVASIVE CV LAB;  Service: Cardiovascular;  Laterality: N/A;   LIGATION OF ARTERIOVENOUS  FISTULA Left 04/14/2016   Procedure: LIGATION OF ARTERIOVENOUS  FISTULA;  Surgeon: Lonni GORMAN Blade, MD;  Location: [facility] OR;  Service: Vascular;  Laterality: Left;   LIGATION OF COMPETING BRANCHES OF ARTERIOVENOUS FISTULA Right 05/17/2020   Procedure: LIGATION OF COMPETING BRANCHES OF RIGHT ARM ARTERIOVENOUS FISTULA;  Surgeon: Serene Gaile ORN, MD;  Location: [facility] OR;  Service: Vascular;  Laterality: Right;   PATCH ANGIOPLASTY Left 06/19/2016   Procedure: PATCH ANGIOPLASTY LEFT ARTERIOVENOUS FISTULA;  Surgeon: Blade Lonni GORMAN, MD;  Location: [facility] OR;  Service: Vascular;  Laterality: Left;   POLYPECTOMY  09/04/2017   Procedure: POLYPECTOMY;  Surgeon: Saintclair Jasper, MD;  Location: [facility] ENDOSCOPY;  Service: Gastroenterology;;   REVISON OF ARTERIOVENOUS FISTULA Left  06/19/2016   Procedure: REVISION SUPERFICIALIZATION OF BRACHIOCEPHALIC ARTERIOVENOUS FISTULA;  Surgeon: Eliza Lonni RAMAN, MD;  Location: [facility] OR;  Service: Vascular;  Laterality: Left;   UPPER EXTREMITY VENOGRAPHY Bilateral 02/19/2021   Procedure: UPPER EXTREMITY VENOGRAPHY;  Surgeon: Lanis Fonda BRAVO, MD;  Location: [facility] INVASIVE CV LAB;  Service: Cardiovascular;  Laterality: Bilateral;   Patient Active Problem List   Diagnosis Date Noted   Gastroparesis 12/31/2022   OSA on CPAP 12/31/2022   Rectal bleeding 10/23/2022   Acute on chronic anemia 10/19/2022   Obesity (BMI 30-39.9) 08/25/2022   Acute encephalopathy 08/24/2022   (HFpEF) heart failure with preserved ejection fraction (HCC) 08/24/2022   Acute respiratory distress 08/11/2022   Pneumonia due to  COVID-19 virus 08/10/2022   Facial twitching 08/09/2022   CAP (community acquired pneumonia) 07/21/2022   Acute pulmonary edema (HCC) 07/20/2022   Hypervolemia associated with renal insufficiency 05/11/2022   Hyperkalemia 05/11/2022   Restless leg syndrome 04/23/2022   Anaphylactic shock, unspecified, initial encounter 09/07/2021   Gout 08/21/2021   GERD without esophagitis 08/21/2021   Anxiety and depression 08/21/2021   Neuropathic pain 05/26/2021   Dystonia 01/13/2021   Polyneuropathy associated with underlying disease (HCC) 01/13/2021   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   Other specified complication of vascular prosthetic devices, implants and grafts, initial encounter (HCC) 09/03/2020   Contact with and (suspected) exposure to covid-19 08/13/2020   Altered mental status, unspecified 06/25/2020   Other irregular eye movements 06/25/2020   PD catheter dysfunction (HCC) 04/26/2020   Other disorders of phosphorus metabolism 04/17/2020   Hypercalcemia 04/15/2020   Impacted cerumen of both ears 04/05/2020   Chronic gouty arthritis 04/05/2020   GAD (generalized anxiety disorder) 04/05/2020   History of hypertension 04/05/2020   Peripheral neuropathy 04/05/2020   Primary insomnia 04/05/2020   Malnutrition of moderate degree 03/13/2020   Hypertensive urgency 03/11/2020   Myoclonia 03/11/2020   Abscess of vulva 02/19/2020   Pericolonic abscess 02/06/2020   Chest pain 05/10/2019   Anemia 04/25/2019   SVT (supraventricular tachycardia) (HCC) 04/25/2019   Elevated troponin 04/25/2019   ESRD on dialysis (HCC) 04/25/2019   Bacterial infection, unspecified 04/17/2019   Pain, unspecified 04/17/2019   Pruritus, unspecified 04/17/2019   Benign paroxysmal positional vertigo of left ear 08/17/2018   Acute on chronic blood loss anemia 05/21/2018   Lower GI bleed 05/20/2018   Symptomatic anemia 10/28/2017   Acidosis 05/24/2017   Chills (without fever) 05/24/2017   Coagulation defect, unspecified  (HCC) 05/24/2017   Dependence on renal dialysis (HCC) 05/24/2017   Encounter for fitting and adjustment of extracorporeal dialysis catheter (HCC) 05/24/2017   Fluid overload, unspecified 05/24/2017   Iron  deficiency anemia, unspecified 05/24/2017   Liver disease, unspecified 05/24/2017   Other disorders of electrolyte and fluid balance, not elsewhere classified 05/24/2017   Other disorders resulting from impaired renal tubular function 05/24/2017   Other long term (current) drug therapy 05/24/2017   Secondary hyperparathyroidism of renal origin (HCC) 05/24/2017   Unspecified abnormal findings in urine 05/24/2017   Unspecified jaundice 05/24/2017   Chronic kidney disease 05/24/2017   Type 2 diabetes mellitus with other diabetic kidney complication (HCC) 05/24/2017   ESRD on peritoneal dialysis (HCC) 04/01/2016   Anemia due to chronic kidney disease 04/01/2016   GERD (gastroesophageal reflux disease) 04/01/2016   Atypical chest pain    Nephrotic syndrome 10/02/2013   Dyslipidemia 10/02/2013   PAT (paroxysmal atrial tachycardia) (HCC) 09/29/2013   Normocytic anemia 06/29/2013   Glomerulonephritis 01/28/2013   Dyspnea 07/04/2011  Morbid obesity (HCC) 07/04/2011   Essential hypertension 07/04/2011   Non-insulin  dependent type 2 diabetes mellitus (HCC) 07/04/2011   Hypercholesterolemia 07/04/2011    PCP: Jesus Griffes, MD   REFERRING PROVIDER: Jesus Griffes, MD   REFERRING DIAG: [phone] (ICD-10-CM) - Other specified enthesopathies of right lower limb, excluding foot   THERAPY DIAG:  Pain in right leg  Difficulty in walking, not elsewhere classified  Localized edema  Rationale for Evaluation and Treatment: Rehabilitation  ONSET DATE: 01/31/23  SUBJECTIVE:   SUBJECTIVE STATEMENT: Right leg 8/10, walked a [place] in [place].. Came back Sunday and I have been hurting a lot.   EVAL: Pt reports she has chronic R leg pain, more lateral and on the front and back of her R  thigh. She experiences burning, swelling and sharp pain. Pt also has minimal low back pain on the R and neuropathy of both feet. 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  PERTINENT HISTORY: Arthritis, CHF, anxiety, ESRD, DM2, neuropathy, high BMI  PAIN:  Are you having pain? Yes: NPRS scale: currently 4/10. Pain range on eval 6-10/10 Pain location: on the lateral ant and post aspects of the R leg Pain description: Burning, sharp, swelling Aggravating factors: Prolonged time on feet, lying on R side Relieving factors: Massager, rest  PRECAUTIONS: None  RED FLAGS: None   WEIGHT BEARING RESTRICTIONS: No  FALLS:  Has patient fallen in last 6 months? No  LIVING ENVIRONMENT: Lives with: lives with their family Lives in: House/apartment Stairs: No Able to access home  OCCUPATION: Disability  PLOF: Independent  PATIENT GOALS: Pain relief  NEXT MD VISIT: 07/1023  OBJECTIVE:  Note: Objective measures were completed at Evaluation unless otherwise noted.  DIAGNOSTIC FINDINGS:   MRI Findings: There is focal fat deposition/small intramuscular lipoma within the proximal right adductor magnus (7:29). Mild fatty atrophy of the visualized right thigh musculature. Insertional gluteus minimus and medius tendinosis. Right common hamstring origin tendinosis. Mild intramuscular edema within the quadratus femoris with narrowed ischiofemoral interval. Tendinosis of the distal obturator externus. The hip and knee are approximated on the large field-of-view images. Degenerative subchondral signal changes with cartilage thinning at the knee. Trace right knee effusion. The neurovascular bundles are normal in course and contour. There is mild subcutaneous edema in the superficial soft tissues of the lateral right hip. No suspicious enhancing soft tissue or osseous lesion within the right thigh. No rim-enhancing fluid collection. Partially visualized postsurgical changes in the right  pelvis.   PATIENT SURVEYS:  LEFS: 31/80= 39%  COGNITION: Overall cognitive status: Within functional limits for tasks assessed     SENSATION: WFL  EDEMA:  Swelling of the R thigh  MUSCLE LENGTH: Hamstrings: Right limited due to pain deg; Left WNLs deg Debby test: Right WNLs deg; Left WNLs deg  POSTURE: rounded shoulders, forward head, and flexed trunk   PALPATION: Markedly TTP to the Ant/lat R thigh, R greater trochanter area, post. thigh T LOWER EXTREMITY ROM:  Active ROM Right eval Left eval Right 07/28/23  Hip flexion 70 pain  90+  Hip extension     Hip abduction 25 pain    Hip adduction     Hip internal rotation     Hip external rotation 20 pain    Knee flexion     Knee extension     Ankle dorsiflexion     Ankle plantarflexion     Ankle inversion     Ankle eversion      (Blank rows =  not tested)  LOWER EXTREMITY MMT:  All MMT for the R leg were limited by pain MMT Right eval Left eval  Hip flexion 2   Hip extension 2   Hip abduction 2   Hip adduction 2   Hip internal rotation 2   Hip external rotation 2   Knee flexion 3   Knee extension 3   Ankle dorsiflexion 3   Ankle plantarflexion 2   Ankle inversion    Ankle eversion     (Blank rows = not tested)  LOWER EXTREMITY SPECIAL TESTS:  NT  FUNCTIONAL TESTS:  5 times sit to stand: TBA 2 minute walk test: TBA 07/15/23 14.9 sec  5 x STS  with UE 356 feet 2 MWT  GAIT: Distance walked: 198ft Assistive device utilized: None Level of assistance: Complete Independence Comments: Antalgic gait pattern                                                                                                                                TREATMENT DATE:   [facility] Adult PT Treatment:                                                DATE: 08/04/23 Therapeutic Exercise: Nustep L4 UE/LE x 5 minutes  Passive right LE - SKTC, hip abduction, ITB stretch, h/s stretch Hip flexor stretch Mini Bridge 2 x 10  Supine  march -small Rom  10 x 2  Supine clam AROM 10 x 2  Right heel slide on slide board x 5  Right quad set 10 x 2  Modalities: Cold pack x 15 minutes to right thigh     [facility] Adult PT Treatment:                                                DATE: 07/28/23 Therapeutic Exercise: LAQ 2# 10 x 2  Seated hip flexion 10 x 2 2#  STS 5 x 2  T.M. 1.5 mph x 5 minutes  Seated Leg press 45# x 10  Bridge x 10  LTR 10 x 2    [facility] Adult PT Treatment:                                                DATE: 07/27/23 Therapeutic Exercise: Nustep L4 x 5 min LAQ- full ext 10 x 3 Lefr, 10 x 2 right  Knee flexion GTB 10 x 2 SLR left x 10  SLR right x 10  SAQ bilat 10 x 3  Mini bridge- gluteal  sets in hook lying 2 x 10  Ball squeeze 2 x 10 S/L right clam AROM 10 x 1, 10 x 5  LTR 10 x 2  Right hip flexor stretch , EOM 15 sec x 2   Modalities: Ice pack  right thigh    [facility] Adult PT Treatment:                                                DATE: 07/22/23 Therapeutic Exercise: Nustep  LAQ- full ext 10 x 2  Knee flexion GTB 10 x 2  SLR x 5 - mid range Quad set x 10 Supine heel slide x 10  Ball squeeze S/L clam AROM Mini bridge- gluteal sets in hook lying x 10  LTR   Modalities: HMP right thigh   [facility] Adult PT Treatment:                                                DATE: 07/21/23 Therapeutic Exercise: 1 set each Quad set x10 5 Heel slides x10 Hamstring set x10 5 Hip abduction iso GTB at ankle supine - 2 pillows under knees for comfort  Hip clam iso GTB at knee x10 5 Supine gluteal sets x10 5  SAQ x10 partial x10 Updated HEP Modalities: Ice pack 10 minutes right thigh and lateral hip supine with small bolster  [facility] Adult PT Treatment:                                                DATE: 07/15/23 Therapeutic Exercise: 1 set each Quad set  Heel slides Hamstring set Hip abduction iso GTB at ankle supine - 2 pillows under knees for comfort  Supine gluteal sets- HEP  Therapeutic  Activity: 14.9 sec  5 x STS  with UE 356 feet 2 MWT Modalities: Ice pack 8 minutes right thigh supine with small bolster concurrent with self care Self Care:  Frequent cold pack applications to reduce edema Avoidance of pain aggravation with HEP      [facility] Adult PT Treatment:                                                DATE: 07/13/23 Therapeutic Exercise: Developed, instructed in, and pt completed therex as noted in HEP  Self Care: Elevation and ice packs for pain and swelling   PATIENT EDUCATION:  Education details: Eval findings, POC, HEP, self care  Person educated: Patient Education method: Explanation, Demonstration, Tactile cues, Verbal cues, and Handouts Education comprehension: verbalized understanding, returned demonstration, verbal cues required, and tactile cues required  HOME EXERCISE PROGRAM: Access Code: [email or web address] URL: [email or web address] Date: 07/21/2023 Prepared by: Dasie Daft  Exercises - Supine Quad Set  - 1 x daily - 7 x weekly - 1 sets - 10 reps - 5 hold - Supine Heel Slides  - 1 x daily - 7 x weekly - 1 sets - 10 reps - 3 hold - Supine Isometric Hamstring Set  -  1 x daily - 7 x weekly - 1 sets - 10 reps - 5 hold - Supine Hip Abduction with Resistance at Ankles  - 1 x daily - 7 x weekly - 1 sets - 10 reps - 5 hold - Supine Gluteal Sets  - 1 x daily - 7 x weekly - 1 sets - 10 reps - 5 hold - Hooklying Bilateral Isometric Clamshell  - 1 x daily - 7 x weekly - 1 sets - 10 reps - 5 hold - Supine Knee Extension Strengthening  - 1 x daily - 7 x weekly - 1 sets - 10 reps - 5 hold  ASSESSMENT:  CLINICAL IMPRESSION: Pt arrives with pain flare after increased walking while on vacation last week. Has been home for 3 days.  Pain 8/10 right lateral thigh pain and pt required a reduced intensity of exercise today due to pain. Will plan to progress as tolerated. Cold pack applied end of session to reduce pain. Encouraged pt to continue ice applications 3  x per day to reduce pain and edema.    07/28/23: PT was completed for strengthening and activity tolerance, progressing to Treadmill and leg press. Pt is going out of town this weekend and hopes to be able to keep up with family.  She is interested in joining gym when it is appropriate. Will continue to introduce gym machines to prepare pt for transition to community exercise.    Pt tolerated the prescribed exs today without adverse effects, only fatigue. Pt will continue to benefit from skilled PT to address impairments for improved function.   EVAL :Patient is a 48 y.o. female who was seen today for physical therapy evaluation and treatment for M76.891 (ICD-10-CM) - Other specified enthesopathies of right lower limb, excluding foot. Pt presents to PT with decreased R LE AROM, strength, and function consistent with the referring Dx. Per LEFS, pt's rates the functional use of the R LE as low, 39%. Pt will benefit from skilled PT to address impairments to optimize function with less pain.   OBJECTIVE IMPAIRMENTS: decreased activity tolerance, decreased mobility, difficulty walking, decreased ROM, decreased strength, increased edema, obesity, and pain.   ACTIVITY LIMITATIONS: carrying, lifting, bending, sitting, standing, squatting, sleeping, stairs, transfers, bed mobility, bathing, toileting, dressing, locomotion level, and caring for others  PARTICIPATION LIMITATIONS: meal prep, cleaning, laundry, driving, shopping, and community activity  PERSONAL FACTORS: Fitness, Past/current experiences, Time since onset of injury/illness/exacerbation, and 3+ comorbidities: Arthritis, CHF, anxiety, ESRD, DM2, neuropathy, high BMI are also affecting patient's functional outcome.   REHAB POTENTIAL: Good  CLINICAL DECISION MAKING: Evolving/moderate complexity  EVALUATION COMPLEXITY: Moderate   GOALS:   SHORT TERM GOALS: Target date: 07/30/23 Pt will be Ind in an initial HEP  Baseline: started Goal status:  ONGOING  2.  Pt will voice understanding of measures to assist in pain reduction  Baseline: started Goal status: ONGOING  3.  Increase R hip flexion AROM to 90d for improved function and as indication of improved pain Baseline:  Goal status: MET   LONG TERM GOALS: Target date: 09/03/23  Pt will be Ind in a final HEP to maintain achieved LOF  Baseline:  Goal status: INITIAL  2.  Increased R hip AROM to flexion 100d, ER 35d, and abd to 40d for improved function with a normalized gait pattern and as indication of improved pain. Baseline: 70, 20, and 25 respectively Goal status: INITIAL  3.  Increase R hip strength to 3/5 and knee strength to 4/5  for improved functional use of the R LE with a normalized gait pattern Baseline:  Goal status: INITIAL  4.  Improve 5xSTS by MCID of 5 and by MCID of 48ft as indication of improved functional mobility  Baseline: TBA 7/10/5: 356 feet and 14.9 sec using UE Goal status: INITIAL  5.  Pt's LEFS score will improve by the MCID to 54% as indication of improved function  Baseline: 39% Goal status: INITIAL   PLAN:  PT FREQUENCY: 2x/week  PT DURATION: 6 weeks  PLANNED INTERVENTIONS: 97164- PT Re-evaluation, 97110-Therapeutic exercises, 97530- Therapeutic activity, 97112- Neuromuscular re-education, 97535- Self Care, [id number]- Manual therapy, [id number]- Gait training, [phone]- Aquatic Therapy, [phone]- Electrical stimulation (unattended), 97016- Vasopneumatic device, 20560 (1-2 muscles), 20561 (3+ muscles)- Dry Needling, Patient/Family education, Balance training, Stair training, Taping, Joint mobilization, Cryotherapy, and Moist heat  PLAN FOR NEXT SESSION: ; assess response to HEP; progress therex as indicated; use of modalities, manual therapy; and TPDN as indicated.   Harlene Persons, PTA 08/04/23 10:29 AM Phone: [phone] Fax: [phone]

## 2023-08-06 ENCOUNTER — Encounter: Payer: Self-pay | Admitting: Physical Therapy

## 2023-08-06 ENCOUNTER — Ambulatory Visit: Attending: Internal Medicine | Admitting: Physical Therapy

## 2023-08-06 DIAGNOSIS — R600 Localized edema: Secondary | ICD-10-CM | POA: Diagnosis not present

## 2023-08-06 DIAGNOSIS — M79604 Pain in right leg: Secondary | ICD-10-CM | POA: Insufficient documentation

## 2023-08-06 DIAGNOSIS — R262 Difficulty in walking, not elsewhere classified: Secondary | ICD-10-CM | POA: Diagnosis not present

## 2023-08-06 NOTE — Therapy (Signed)
 [facility] LOWER EXTREMITY TREATMENT    [name] MRN: [id number] DOB:12-16-1975, 48 y.o., female Today's Date: 08/06/2023  END OF SESSION:  PT End of Session - 08/06/23 0922     Visit Number 8    Number of Visits 13    Date for PT Re-Evaluation 09/03/23    Authorization Type [employer] DUAL COMPLETE    Authorization - Visit Number 8    Authorization - Number of Visits 27    PT Start Time 0925    PT Stop Time 1020    PT Time Calculation (min) 55 min            Past Medical History:  Diagnosis Date   Acid reflux    Anemia 04/2019   REQUIRING BLOOD TRANSFUSIONS   Anxiety    Arthritis    CHF (congestive heart failure) (HCC)    Dyspnea    with exertion   Dysrhythmia    tachycardia   ESRD (end stage renal disease) (HCC)    [facility] [place]   Facial twitching 11/2020   mouth twitch, eyes roll back, neurology started patient on low dose   Sinemet  IR.   GI bleeding 12/2017   Gout    Headache(784.0)    related to chemo; sometimes weekly (09/12/2013)   Heart murmur    Systolic per Dr Salena Negri' notes   High cholesterol    History of blood transfusion a couple   related to low counts   Hypertension    Iron  deficiency anemia    get epogen shots q month (02/20/2014)   MCGN (minimal change glomerulonephritis)    using chemo to tx;  finished my last tx in 12/2013   Neuropathy    Peritoneal dialysis status (HCC)    Stroke (HCC) 08/15/2017   ruled out that it was not a stroke   Past Surgical History:  Procedure Laterality Date   ABDOMINAL HYSTERECTOMY  2010   laparoscopic   ANKLE FRACTURE SURGERY Right 1994   AV FISTULA PLACEMENT Left 04/14/2016   Procedure: ARTERIOVENOUS (AV) FISTULA CREATION;  Surgeon: Lonni GORMAN Blade, MD;  Location: [facility] OR;  Service: Vascular;  Laterality: Left;   AV FISTULA PLACEMENT Left 08/09/2017   Procedure: INSERTION OF ARTERIOVENOUS (AV) GORE-TEX GRAFT LEFT ARM;  Surgeon:  Harvey Carlin BRAVO, MD;  Location: [facility] OR;  Service: Vascular;  Laterality: Left;   AV FISTULA PLACEMENT Right 03/15/2020   Procedure: RIGHT ARM ARTERIOVENOUS (AV) FISTULA CREATION 1st Stage Basilic Transposition;  Surgeon: Serene Gaile ORN, MD;  Location: [facility] OR;  Service: Vascular;  Laterality: Right;   AV FISTULA PLACEMENT Right 02/24/2021   Procedure: RIGHT ARM ARTERIOVENOUS  FISTULA CREATION;  Surgeon: Lanis Fonda BRAVO, MD;  Location: [facility] OR;  Service: Vascular;  Laterality: Right;  regional block   AVGG REMOVAL Left 08/11/2017   Procedure: REMOVAL OF ARTERIOVENOUS GORETEX GRAFT (AVGG);  Surgeon: Serene Gaile ORN, MD;  Location: [facility] OR;  Service: Vascular;  Laterality: Left;   BASCILIC VEIN TRANSPOSITION Right 05/17/2020   Procedure: RIGHT UPPER EXTREMITY SECOND STAGE BASCILIC VEIN TRANSPOSITION;  Surgeon: Serene Gaile ORN, MD;  Location: [facility] OR;  Service: Vascular;  Laterality: Right;   BIOPSY  09/03/2017   Procedure: BIOPSY;  Surgeon: Elicia Claw, MD;  Location: [facility] ENDOSCOPY;  Service: Gastroenterology;;   BIOPSY  12/24/2017   Procedure: BIOPSY;  Surgeon: Wilhelmenia Aloha Raddle., MD;  Location: [facility] ENDOSCOPY;  Service: Gastroenterology;;   CARDIAC CATHETERIZATION  2000's   COLONOSCOPY N/A 10/22/2022  Procedure: COLONOSCOPY;  Surgeon: Maryruth Ole DASEN, MD;  Location: [facility] ENDOSCOPY;  Service: Endoscopy;  Laterality: N/A;   COLONOSCOPY WITH PROPOFOL  N/A 09/04/2017   Procedure: COLONOSCOPY WITH PROPOFOL ;  Surgeon: Saintclair Jasper, MD;  Location: [facility] ENDOSCOPY;  Service: Gastroenterology;  Laterality: N/A;   ENTEROSCOPY N/A 12/24/2017   Procedure: ENTEROSCOPY;  Surgeon: Wilhelmenia Aloha Raddle., MD;  Location: [facility] ENDOSCOPY;  Service: Gastroenterology;  Laterality: N/A;   ESOPHAGOGASTRODUODENOSCOPY     ESOPHAGOGASTRODUODENOSCOPY (EGD) WITH PROPOFOL  Left 06/04/2017   Procedure: ESOPHAGOGASTRODUODENOSCOPY (EGD) WITH PROPOFOL ;  Surgeon: Burnette Fallow, MD;  Location: [facility] ENDOSCOPY;  Service: Endoscopy;   Laterality: Left;   ESOPHAGOGASTRODUODENOSCOPY (EGD) WITH PROPOFOL  N/A 09/03/2017   Procedure: ESOPHAGOGASTRODUODENOSCOPY (EGD) WITH PROPOFOL ;  Surgeon: Elicia Claw, MD;  Location: [facility] ENDOSCOPY;  Service: Gastroenterology;  Laterality: N/A;   ESOPHAGOGASTRODUODENOSCOPY (EGD) WITH PROPOFOL  N/A 04/27/2022   Procedure: ESOPHAGOGASTRODUODENOSCOPY (EGD) WITH PROPOFOL ;  Surgeon: Maryruth Ole DASEN, MD;  Location: [facility] ENDOSCOPY;  Service: Endoscopy;  Laterality: N/A;   FISTULA SUPERFICIALIZATION Left 04/02/2017   Procedure: FISTULA SUPERFICIALIZATION LEFT ARM;  Surgeon: Serene Gaile ORN, MD;  Location: [facility] OR;  Service: Vascular;  Laterality: Left;   FISTULA SUPERFICIALIZATION Right 04/14/2021   Procedure: RIGHT ARM FISTULA SUPERFICIALIZATION;  Surgeon: Lanis Fonda BRAVO, MD;  Location: [facility] OR;  Service: Vascular;  Laterality: Right;  PERIPHERAL NERVE BLOCK   FISTULOGRAM Left 04/07/2016   Procedure: FISTULOGRAM;  Surgeon: Penne Lonni Colorado, MD;  Location: [facility] OR;  Service: Vascular;  Laterality: Left;   FRACTURE SURGERY     right ankle   GIVENS CAPSULE STUDY N/A 10/29/2017   Procedure: GIVENS CAPSULE STUDY;  Surgeon: Dianna Specking, MD;  Location: [facility] ENDOSCOPY;  Service: Endoscopy;  Laterality: N/A;   GIVENS CAPSULE STUDY N/A 12/24/2017   Procedure: GIVENS CAPSULE STUDY;  Surgeon: Wilhelmenia Aloha Raddle., MD;  Location: [facility] ENDOSCOPY;  Service: Gastroenterology;  Laterality: N/A;   GIVENS CAPSULE STUDY N/A 05/22/2018   Procedure: GIVENS CAPSULE STUDY;  Surgeon: Wilhelmenia Aloha Raddle., MD;  Location: [facility] ENDOSCOPY;  Service: Gastroenterology;  Laterality: N/A;   INSERTION OF DIALYSIS CATHETER Right 04/07/2016   Procedure: INSERTION OF DIALYSIS CATHETER;  Surgeon: Penne Lonni Colorado, MD;  Location: [facility] OR;  Service: Vascular;  Laterality: Right;   INSERTION OF DIALYSIS CATHETER Right 04/04/2017   Procedure: INSERTION OF DIALYSIS CATHETER;  Surgeon: Colorado Penne Lonni, MD;  Location: [facility]  OR;  Service: Vascular;  Laterality: Right;   IR FLUORO GUIDE CV LINE LEFT  03/13/2020   IR US  GUIDE VASC ACCESS LEFT  03/13/2020   LEFT HEART CATH AND CORONARY ANGIOGRAPHY N/A 05/12/2019   Procedure: LEFT HEART CATH AND CORONARY ANGIOGRAPHY;  Surgeon: Claudene Pacific, MD;  [facility]: [facility] INVASIVE CV LAB;  Service: Cardiovascular;  Laterality: N/A;   LIGATION OF ARTERIOVENOUS  FISTULA Left 04/14/2016   Procedure: LIGATION OF ARTERIOVENOUS  FISTULA;  Surgeon: Lonni GORMAN Blade, MD;  Location: [facility] OR;  Service: Vascular;  Laterality: Left;   LIGATION OF COMPETING BRANCHES OF ARTERIOVENOUS FISTULA Right 05/17/2020   Procedure: LIGATION OF COMPETING BRANCHES OF RIGHT ARM ARTERIOVENOUS FISTULA;  Surgeon: Serene Gaile ORN, MD;  Location: [facility] OR;  Service: Vascular;  Laterality: Right;   PATCH ANGIOPLASTY Left 06/19/2016   Procedure: PATCH ANGIOPLASTY LEFT ARTERIOVENOUS FISTULA;  Surgeon: Blade Lonni GORMAN, MD;  Location: [facility] OR;  Service: Vascular;  Laterality: Left;   POLYPECTOMY  09/04/2017   Procedure: POLYPECTOMY;  Surgeon: Saintclair Jasper, MD;  Location: [facility] ENDOSCOPY;  Service: Gastroenterology;;   REVISON OF ARTERIOVENOUS FISTULA Left  06/19/2016   Procedure: REVISION SUPERFICIALIZATION OF BRACHIOCEPHALIC ARTERIOVENOUS FISTULA;  Surgeon: Eliza Lonni RAMAN, MD;  Location: [facility] OR;  Service: Vascular;  Laterality: Left;   UPPER EXTREMITY VENOGRAPHY Bilateral 02/19/2021   Procedure: UPPER EXTREMITY VENOGRAPHY;  Surgeon: Lanis Fonda BRAVO, MD;  Location: [facility] INVASIVE CV LAB;  Service: Cardiovascular;  Laterality: Bilateral;   Patient Active Problem List   Diagnosis Date Noted   Gastroparesis 12/31/2022   OSA on CPAP 12/31/2022   Rectal bleeding 10/23/2022   Acute on chronic anemia 10/19/2022   Obesity (BMI 30-39.9) 08/25/2022   Acute encephalopathy 08/24/2022   (HFpEF) heart failure with preserved ejection fraction (HCC) 08/24/2022   Acute respiratory distress 08/11/2022   Pneumonia due to COVID-19 virus  08/10/2022   Facial twitching 08/09/2022   CAP (community acquired pneumonia) 07/21/2022   Acute pulmonary edema (HCC) 07/20/2022   Hypervolemia associated with renal insufficiency 05/11/2022   Hyperkalemia 05/11/2022   Restless leg syndrome 04/23/2022   Anaphylactic shock, unspecified, initial encounter 09/07/2021   Gout 08/21/2021   GERD without esophagitis 08/21/2021   Anxiety and depression 08/21/2021   Neuropathic pain 05/26/2021   Dystonia 01/13/2021   Polyneuropathy associated with underlying disease (HCC) 01/13/2021   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   Other specified complication of vascular prosthetic devices, implants and grafts, initial encounter (HCC) 09/03/2020   Contact with and (suspected) exposure to covid-19 08/13/2020   Altered mental status, unspecified 06/25/2020   Other irregular eye movements 06/25/2020   PD catheter dysfunction (HCC) 04/26/2020   Other disorders of phosphorus metabolism 04/17/2020   Hypercalcemia 04/15/2020   Impacted cerumen of both ears 04/05/2020   Chronic gouty arthritis 04/05/2020   GAD (generalized anxiety disorder) 04/05/2020   History of hypertension 04/05/2020   Peripheral neuropathy 04/05/2020   Primary insomnia 04/05/2020   Malnutrition of moderate degree 03/13/2020   Hypertensive urgency 03/11/2020   Myoclonia 03/11/2020   Abscess of vulva 02/19/2020   Pericolonic abscess 02/06/2020   Chest pain 05/10/2019   Anemia 04/25/2019   SVT (supraventricular tachycardia) (HCC) 04/25/2019   Elevated troponin 04/25/2019   ESRD on dialysis (HCC) 04/25/2019   Bacterial infection, unspecified 04/17/2019   Pain, unspecified 04/17/2019   Pruritus, unspecified 04/17/2019   Benign paroxysmal positional vertigo of left ear 08/17/2018   Acute on chronic blood loss anemia 05/21/2018   Lower GI bleed 05/20/2018   Symptomatic anemia 10/28/2017   Acidosis 05/24/2017   Chills (without fever) 05/24/2017   Coagulation defect, unspecified (HCC)  05/24/2017   Dependence on renal dialysis (HCC) 05/24/2017   Encounter for fitting and adjustment of extracorporeal dialysis catheter (HCC) 05/24/2017   Fluid overload, unspecified 05/24/2017   Iron  deficiency anemia, unspecified 05/24/2017   Liver disease, unspecified 05/24/2017   Other disorders of electrolyte and fluid balance, not elsewhere classified 05/24/2017   Other disorders resulting from impaired renal tubular function 05/24/2017   Other long term (current) drug therapy 05/24/2017   Secondary hyperparathyroidism of renal origin (HCC) 05/24/2017   Unspecified abnormal findings in urine 05/24/2017   Unspecified jaundice 05/24/2017   Chronic kidney disease 05/24/2017   Type 2 diabetes mellitus with other diabetic kidney complication (HCC) 05/24/2017   ESRD on peritoneal dialysis (HCC) 04/01/2016   Anemia due to chronic kidney disease 04/01/2016   GERD (gastroesophageal reflux disease) 04/01/2016   Atypical chest pain    Nephrotic syndrome 10/02/2013   Dyslipidemia 10/02/2013   PAT (paroxysmal atrial tachycardia) (HCC) 09/29/2013   Normocytic anemia 06/29/2013   Glomerulonephritis 01/28/2013   Dyspnea 07/04/2011  Morbid obesity (HCC) 07/04/2011   Essential hypertension 07/04/2011   Non-insulin  dependent type 2 diabetes mellitus (HCC) 07/04/2011   Hypercholesterolemia 07/04/2011    PCP: Jesus Griffes, MD   REFERRING PROVIDER: Jesus Griffes, MD   REFERRING DIAG: [phone] (ICD-10-CM) - Other specified enthesopathies of right lower limb, excluding foot   THERAPY DIAG:  Pain in right leg  Difficulty in walking, not elsewhere classified  Localized edema  Rationale for Evaluation and Treatment: Rehabilitation  ONSET DATE: 01/31/23  SUBJECTIVE:   SUBJECTIVE STATEMENT: The pain in the right leg is better than the other day. I am able to move my leg better.   EVAL: Pt reports she has chronic R leg pain, more lateral and on the front and back of her R thigh.  She experiences burning, swelling and sharp pain. Pt also has minimal low back pain on the R and neuropathy of both feet. 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  PERTINENT HISTORY: Arthritis, CHF, anxiety, ESRD, DM2, neuropathy, high BMI  PAIN:  Are you having pain? Yes: NPRS scale: currently 4/10. Pain range on eval 6-10/10 Pain location: on the lateral ant and post aspects of the R leg Pain description: Burning, sharp, swelling Aggravating factors: Prolonged time on feet, lying on R side Relieving factors: Massager, rest  PRECAUTIONS: None  RED FLAGS: None   WEIGHT BEARING RESTRICTIONS: No  FALLS:  Has patient fallen in last 6 months? No  LIVING ENVIRONMENT: Lives with: lives with their family Lives in: House/apartment Stairs: No Able to access home  OCCUPATION: Disability  PLOF: Independent  PATIENT GOALS: Pain relief  NEXT MD VISIT: 07/1023  OBJECTIVE:  Note: Objective measures were completed at Evaluation unless otherwise noted.  DIAGNOSTIC FINDINGS:   MRI Findings: There is focal fat deposition/small intramuscular lipoma within the proximal right adductor magnus (7:29). Mild fatty atrophy of the visualized right thigh musculature. Insertional gluteus minimus and medius tendinosis. Right common hamstring origin tendinosis. Mild intramuscular edema within the quadratus femoris with narrowed ischiofemoral interval. Tendinosis of the distal obturator externus. The hip and knee are approximated on the large field-of-view images. Degenerative subchondral signal changes with cartilage thinning at the knee. Trace right knee effusion. The neurovascular bundles are normal in course and contour. There is mild subcutaneous edema in the superficial soft tissues of the lateral right hip. No suspicious enhancing soft tissue or osseous lesion within the right thigh. No rim-enhancing fluid collection. Partially visualized postsurgical changes in the right  pelvis.   PATIENT SURVEYS:  LEFS: 31/80= 39%  COGNITION: Overall cognitive status: Within functional limits for tasks assessed     SENSATION: WFL  EDEMA:  Swelling of the R thigh  MUSCLE LENGTH: Hamstrings: Right limited due to pain deg; Left WNLs deg Debby test: Right WNLs deg; Left WNLs deg  POSTURE: rounded shoulders, forward head, and flexed trunk   PALPATION: Markedly TTP to the Ant/lat R thigh, R greater trochanter area, post. thigh T LOWER EXTREMITY ROM:  Active ROM Right eval Left eval Right 07/28/23  Hip flexion 70 pain  90+  Hip extension     Hip abduction 25 pain    Hip adduction     Hip internal rotation     Hip external rotation 20 pain    Knee flexion     Knee extension     Ankle dorsiflexion     Ankle plantarflexion     Ankle inversion     Ankle eversion      (  Blank rows = not tested)  LOWER EXTREMITY MMT:  All MMT for the R leg were limited by pain MMT Right eval Left eval  Hip flexion 2   Hip extension 2   Hip abduction 2   Hip adduction 2   Hip internal rotation 2   Hip external rotation 2   Knee flexion 3   Knee extension 3   Ankle dorsiflexion 3   Ankle plantarflexion 2   Ankle inversion    Ankle eversion     (Blank rows = not tested)  LOWER EXTREMITY SPECIAL TESTS:  NT  FUNCTIONAL TESTS:  5 times sit to stand: TBA 2 minute walk test: TBA 07/15/23 14.9 sec  5 x STS  with UE 356 feet 2 MWT  GAIT: Distance walked: 162ft Assistive device utilized: None Level of assistance: Complete Independence Comments: Antalgic gait pattern                                                                                                                                TREATMENT DATE:   [facility] Adult PT Treatment:                                                DATE: 08/06/23 Therapeutic Exercise/ Activity Nustep L4 UE/LE x 7 minutes  Suppoerted squats at FM bar x 10 Heel raises Hamstring curl right, standing Gastroc stretch each side   Standing high knee March  Standing hip abduction and extension, 10 x each bilat Supine right hip flexor stretch  Bridge 2 x 10  SAQ 10 x 2 bilat  Ball squeeze 10 x 2  LTR 10 x 2  SLR x 10 right  Side lying right clam 2 x 10  Seated LAQ 10 x 2   Modalities: Cold pack x 15 minutes to right thigh     [facility] Adult PT Treatment:                                                DATE: 08/04/23 Therapeutic Exercise: Nustep L4 UE/LE x 5 minutes  Passive right LE - SKTC, hip abduction, ITB stretch, h/s stretch Hip flexor stretch Mini Bridge 2 x 10  Supine march -small Rom  10 x 2  Supine clam AROM 10 x 2  Right heel slide on slide board x 5  Right quad set 10 x 2  Modalities: Cold pack x 15 minutes to right thigh     [facility] Adult PT Treatment:  DATE: 07/28/23 Therapeutic Exercise: LAQ 2# 10 x 2  Seated hip flexion 10 x 2 2#  STS 5 x 2  T.M. 1.5 mph x 5 minutes  Seated Leg press 45# x 10  Bridge x 10  LTR 10 x 2    [facility] Adult PT Treatment:                                                DATE: 07/27/23 Therapeutic Exercise: Nustep L4 x 5 min LAQ- full ext 10 x 3 Lefr, 10 x 2 right  Knee flexion GTB 10 x 2 SLR left x 10  SLR right x 10  SAQ bilat 10 x 3  Mini bridge- gluteal sets in hook lying 2 x 10  Ball squeeze 2 x 10 S/L right clam AROM 10 x 1, 10 x 5  LTR 10 x 2  Right hip flexor stretch , EOM 15 sec x 2   Modalities: Ice pack  right thigh    [facility] Adult PT Treatment:                                                DATE: 07/22/23 Therapeutic Exercise: Nustep  LAQ- full ext 10 x 2  Knee flexion GTB 10 x 2  SLR x 5 - mid range Quad set x 10 Supine heel slide x 10  Ball squeeze S/L clam AROM Mini bridge- gluteal sets in hook lying x 10  LTR   Modalities: HMP right thigh     PATIENT EDUCATION:  Education details: Eval findings, POC, HEP, self care  Person educated: Patient Education method: Explanation,  Demonstration, Tactile cues, Verbal cues, and Handouts Education comprehension: verbalized understanding, returned demonstration, verbal cues required, and tactile cues required  HOME EXERCISE PROGRAM: Access Code: [email or web address] URL: [email or web address] Date: 07/21/2023 Prepared by: Dasie Daft  Exercises - Supine Quad Set  - 1 x daily - 7 x weekly - 1 sets - 10 reps - 5 hold - Supine Heel Slides  - 1 x daily - 7 x weekly - 1 sets - 10 reps - 3 hold - Supine Isometric Hamstring Set  - 1 x daily - 7 x weekly - 1 sets - 10 reps - 5 hold - Supine Hip Abduction with Resistance at Ankles  - 1 x daily - 7 x weekly - 1 sets - 10 reps - 5 hold - Supine Gluteal Sets  - 1 x daily - 7 x weekly - 1 sets - 10 reps - 5 hold - Hooklying Bilateral Isometric Clamshell  - 1 x daily - 7 x weekly - 1 sets - 10 reps - 5 hold - Supine Knee Extension Strengthening  - 1 x daily - 7 x weekly - 1 sets - 10 reps - 5 hold  ASSESSMENT:  CLINICAL IMPRESSION: Pt able to resume more AROM and progress to closed chain exercises today. Pain is decreased since last visit with heat/ice and rest over this week. Tolerated closed chain progression well. Did report LE fatigue by end of session.    07/28/23: PT was completed for strengthening and activity tolerance, progressing to Treadmill and leg press. Pt is going out of town this weekend and hopes to be able  to keep up with family.  She is interested in joining gym when it is appropriate. Will continue to introduce gym machines to prepare pt for transition to community exercise.    Pt tolerated the prescribed exs today without adverse effects, only fatigue. Pt will continue to benefit from skilled PT to address impairments for improved function.   EVAL :Patient is a 48 y.o. female who was seen today for physical therapy evaluation and treatment for M76.891 (ICD-10-CM) - Other specified enthesopathies of right lower limb, excluding foot. Pt presents to PT with  decreased R LE AROM, strength, and function consistent with the referring Dx. Per LEFS, pt's rates the functional use of the R LE as low, 39%. Pt will benefit from skilled PT to address impairments to optimize function with less pain.   OBJECTIVE IMPAIRMENTS: decreased activity tolerance, decreased mobility, difficulty walking, decreased ROM, decreased strength, increased edema, obesity, and pain.   ACTIVITY LIMITATIONS: carrying, lifting, bending, sitting, standing, squatting, sleeping, stairs, transfers, bed mobility, bathing, toileting, dressing, locomotion level, and caring for others  PARTICIPATION LIMITATIONS: meal prep, cleaning, laundry, driving, shopping, and community activity  PERSONAL FACTORS: Fitness, Past/current experiences, Time since onset of injury/illness/exacerbation, and 3+ comorbidities: Arthritis, CHF, anxiety, ESRD, DM2, neuropathy, high BMI are also affecting patient's functional outcome.   REHAB POTENTIAL: Good  CLINICAL DECISION MAKING: Evolving/moderate complexity  EVALUATION COMPLEXITY: Moderate   GOALS:   SHORT TERM GOALS: Target date: 07/30/23 Pt will be Ind in an initial HEP  Baseline: started Goal status: ONGOING  2.  Pt will voice understanding of measures to assist in pain reduction  Baseline: started Goal status: ONGOING  3.  Increase R hip flexion AROM to 90d for improved function and as indication of improved pain Baseline:  Goal status: MET   LONG TERM GOALS: Target date: 09/03/23  Pt will be Ind in a final HEP to maintain achieved LOF  Baseline:  Goal status: INITIAL  2.  Increased R hip AROM to flexion 100d, ER 35d, and abd to 40d for improved function with a normalized gait pattern and as indication of improved pain. Baseline: 70, 20, and 25 respectively Goal status: INITIAL  3.  Increase R hip strength to 3/5 and knee strength to 4/5 for improved functional use of the R LE with a normalized gait pattern Baseline:  Goal status:  INITIAL  4.  Improve 5xSTS by MCID of 5 and by MCID of 63ft as indication of improved functional mobility  Baseline: TBA 7/10/5: 356 feet and 14.9 sec using UE Goal status: INITIAL  5.  Pt's LEFS score will improve by the MCID to 54% as indication of improved function  Baseline: 39% Goal status: INITIAL   PLAN:  PT FREQUENCY: 2x/week  PT DURATION: 6 weeks  PLANNED INTERVENTIONS: 97164- PT Re-evaluation, 97110-Therapeutic exercises, 97530- Therapeutic activity, 97112- Neuromuscular re-education, 97535- Self Care, [id number]- Manual therapy, [id number]- Gait training, [phone]- Aquatic Therapy, [phone]- Electrical stimulation (unattended), 97016- Vasopneumatic device, 20560 (1-2 muscles), 20561 (3+ muscles)- Dry Needling, Patient/Family education, Balance training, Stair training, Taping, Joint mobilization, Cryotherapy, and Moist heat  PLAN FOR NEXT SESSION: ; assess response to HEP; progress therex as indicated; use of modalities, manual therapy; and TPDN as indicated.   Harlene Persons, PTA 08/06/23 10:39 AM Phone: [phone] Fax: [phone]

## 2023-08-09 NOTE — Therapy (Deleted)
 [facility] LOWER EXTREMITY TREATMENT    [name] MRN: [id number] DOB:1975/07/22, 48 y.o., female Today's Date: 08/09/2023  END OF SESSION:      Past Medical History:  Diagnosis Date   Acid reflux    Anemia 04/2019   REQUIRING BLOOD TRANSFUSIONS   Anxiety    Arthritis    CHF (congestive heart failure) (HCC)    Dyspnea    with exertion   Dysrhythmia    tachycardia   ESRD (end stage renal disease) (HCC)    [facility] [place]   Facial twitching 11/2020   mouth twitch, eyes roll back, neurology started patient on low dose   Sinemet  IR.   GI bleeding 12/2017   Gout    Headache(784.0)    related to chemo; sometimes weekly (09/12/2013)   Heart murmur    Systolic per Dr Salena Negri' notes   High cholesterol    History of blood transfusion a couple   related to low counts   Hypertension    Iron  deficiency anemia    get epogen shots q month (02/20/2014)   MCGN (minimal change glomerulonephritis)    using chemo to tx;  finished my last tx in 12/2013   Neuropathy    Peritoneal dialysis status (HCC)    Stroke (HCC) 08/15/2017   ruled out that it was not a stroke   Past Surgical History:  Procedure Laterality Date   ABDOMINAL HYSTERECTOMY  2010   laparoscopic   ANKLE FRACTURE SURGERY Right 1994   AV FISTULA PLACEMENT Left 04/14/2016   Procedure: ARTERIOVENOUS (AV) FISTULA CREATION;  Surgeon: Lonni GORMAN Blade, MD;  Location: [facility] OR;  Service: Vascular;  Laterality: Left;   AV FISTULA PLACEMENT Left 08/09/2017   Procedure: INSERTION OF ARTERIOVENOUS (AV) GORE-TEX GRAFT LEFT ARM;  Surgeon: Harvey Carlin BRAVO, MD;  Location: [facility] OR;  Service: Vascular;  Laterality: Left;   AV FISTULA PLACEMENT Right 03/15/2020   Procedure: RIGHT ARM ARTERIOVENOUS (AV) FISTULA CREATION 1st Stage Basilic Transposition;  Surgeon: Serene Gaile ORN, MD;  Location: [facility] OR;  Service: Vascular;  Laterality: Right;   AV FISTULA PLACEMENT Right  02/24/2021   Procedure: RIGHT ARM ARTERIOVENOUS  FISTULA CREATION;  Surgeon: Lanis Fonda BRAVO, MD;  Location: [facility] OR;  Service: Vascular;  Laterality: Right;  regional block   AVGG REMOVAL Left 08/11/2017   Procedure: REMOVAL OF ARTERIOVENOUS GORETEX GRAFT (AVGG);  Surgeon: Serene Gaile ORN, MD;  Location: [facility] OR;  Service: Vascular;  Laterality: Left;   BASCILIC VEIN TRANSPOSITION Right 05/17/2020   Procedure: RIGHT UPPER EXTREMITY SECOND STAGE BASCILIC VEIN TRANSPOSITION;  Surgeon: Serene Gaile ORN, MD;  Location: [facility] OR;  Service: Vascular;  Laterality: Right;   BIOPSY  09/03/2017   Procedure: BIOPSY;  Surgeon: Elicia Claw, MD;  Location: [facility] ENDOSCOPY;  Service: Gastroenterology;;   BIOPSY  12/24/2017   Procedure: BIOPSY;  Surgeon: Wilhelmenia Aloha Raddle., MD;  Location: [facility] ENDOSCOPY;  Service: Gastroenterology;;   CARDIAC CATHETERIZATION  2000's   COLONOSCOPY N/A 10/22/2022   Procedure: COLONOSCOPY;  Surgeon: Maryruth Ole DASEN, MD;  Location: [facility] ENDOSCOPY;  Service: Endoscopy;  Laterality: N/A;   COLONOSCOPY WITH PROPOFOL  N/A 09/04/2017   Procedure: COLONOSCOPY WITH PROPOFOL ;  Surgeon: Saintclair Jasper, MD;  Location: [facility] ENDOSCOPY;  Service: Gastroenterology;  Laterality: N/A;   ENTEROSCOPY N/A 12/24/2017   Procedure: ENTEROSCOPY;  Surgeon: Wilhelmenia Aloha Raddle., MD;  Location: [facility] ENDOSCOPY;  Service: Gastroenterology;  Laterality: N/A;   ESOPHAGOGASTRODUODENOSCOPY     ESOPHAGOGASTRODUODENOSCOPY (EGD) WITH PROPOFOL   Left 06/04/2017   Procedure: ESOPHAGOGASTRODUODENOSCOPY (EGD) WITH PROPOFOL ;  Surgeon: Burnette Fallow, MD;  Location: [facility] ENDOSCOPY;  Service: Endoscopy;  Laterality: Left;   ESOPHAGOGASTRODUODENOSCOPY (EGD) WITH PROPOFOL  N/A 09/03/2017   Procedure: ESOPHAGOGASTRODUODENOSCOPY (EGD) WITH PROPOFOL ;  Surgeon: Elicia Claw, MD;  Location: [facility] ENDOSCOPY;  Service: Gastroenterology;  Laterality: N/A;   ESOPHAGOGASTRODUODENOSCOPY (EGD) WITH PROPOFOL  N/A 04/27/2022   Procedure:  ESOPHAGOGASTRODUODENOSCOPY (EGD) WITH PROPOFOL ;  Surgeon: Maryruth Ole DASEN, MD;  Location: [facility] ENDOSCOPY;  Service: Endoscopy;  Laterality: N/A;   FISTULA SUPERFICIALIZATION Left 04/02/2017   Procedure: FISTULA SUPERFICIALIZATION LEFT ARM;  Surgeon: Serene Gaile ORN, MD;  Location: [facility] OR;  Service: Vascular;  Laterality: Left;   FISTULA SUPERFICIALIZATION Right 04/14/2021   Procedure: RIGHT ARM FISTULA SUPERFICIALIZATION;  Surgeon: Lanis Fonda BRAVO, MD;  Location: [facility] OR;  Service: Vascular;  Laterality: Right;  PERIPHERAL NERVE BLOCK   FISTULOGRAM Left 04/07/2016   Procedure: FISTULOGRAM;  Surgeon: Penne Lonni Colorado, MD;  Location: [facility] OR;  Service: Vascular;  Laterality: Left;   FRACTURE SURGERY     right ankle   GIVENS CAPSULE STUDY N/A 10/29/2017   Procedure: GIVENS CAPSULE STUDY;  Surgeon: Dianna Specking, MD;  Location: [facility] ENDOSCOPY;  Service: Endoscopy;  Laterality: N/A;   GIVENS CAPSULE STUDY N/A 12/24/2017   Procedure: GIVENS CAPSULE STUDY;  Surgeon: Wilhelmenia Aloha Raddle., MD;  Location: [facility] ENDOSCOPY;  Service: Gastroenterology;  Laterality: N/A;   GIVENS CAPSULE STUDY N/A 05/22/2018   Procedure: GIVENS CAPSULE STUDY;  Surgeon: Wilhelmenia Aloha Raddle., MD;  Location: [facility] ENDOSCOPY;  Service: Gastroenterology;  Laterality: N/A;   INSERTION OF DIALYSIS CATHETER Right 04/07/2016   Procedure: INSERTION OF DIALYSIS CATHETER;  Surgeon: Penne Lonni Colorado, MD;  Location: [facility] OR;  Service: Vascular;  Laterality: Right;   INSERTION OF DIALYSIS CATHETER Right 04/04/2017   Procedure: INSERTION OF DIALYSIS CATHETER;  Surgeon: [place] Penne Lonni, MD;  Location: [facility] OR;  Service: Vascular;  Laterality: Right;   IR FLUORO GUIDE CV LINE LEFT  03/13/2020   IR US  GUIDE VASC ACCESS LEFT  03/13/2020   LEFT HEART CATH AND CORONARY ANGIOGRAPHY N/A 05/12/2019   Procedure: LEFT HEART CATH AND CORONARY ANGIOGRAPHY;  Surgeon: Claudene Pacific, MD;  [facility] INVASIVE CV LAB;  Service: Cardiovascular;   Laterality: N/A;   LIGATION OF ARTERIOVENOUS  FISTULA Left 04/14/2016   Procedure: LIGATION OF ARTERIOVENOUS  FISTULA;  Surgeon: Lonni GORMAN Blade, MD;  Location: [facility] OR;  Service: Vascular;  Laterality: Left;   LIGATION OF COMPETING BRANCHES OF ARTERIOVENOUS FISTULA Right 05/17/2020   Procedure: LIGATION OF COMPETING BRANCHES OF RIGHT ARM ARTERIOVENOUS FISTULA;  Surgeon: Serene Gaile ORN, MD;  Location: [facility] OR;  Service: Vascular;  Laterality: Right;   PATCH ANGIOPLASTY Left 06/19/2016   Procedure: PATCH ANGIOPLASTY LEFT ARTERIOVENOUS FISTULA;  Surgeon: Blade Lonni GORMAN, MD;  Location: [facility] OR;  Service: Vascular;  Laterality: Left;   POLYPECTOMY  09/04/2017   Procedure: POLYPECTOMY;  Surgeon: Saintclair Jasper, MD;  Location: [facility] ENDOSCOPY;  Service: Gastroenterology;;   REVISON OF ARTERIOVENOUS FISTULA Left 06/19/2016   Procedure: REVISION SUPERFICIALIZATION OF BRACHIOCEPHALIC ARTERIOVENOUS FISTULA;  Surgeon: Blade Lonni GORMAN, MD;  Location: [facility] OR;  Service: Vascular;  Laterality: Left;   UPPER EXTREMITY VENOGRAPHY Bilateral 02/19/2021   Procedure: UPPER EXTREMITY VENOGRAPHY;  Surgeon: Lanis Fonda BRAVO, MD;  Location: [facility] INVASIVE CV LAB;  Service: Cardiovascular;  Laterality: Bilateral;   Patient Active Problem List   Diagnosis Date Noted   Gastroparesis 12/31/2022   OSA on CPAP 12/31/2022   Rectal bleeding  10/23/2022   Acute on chronic anemia 10/19/2022   Obesity (BMI 30-39.9) 08/25/2022   Acute encephalopathy 08/24/2022   (HFpEF) heart failure with preserved ejection fraction (HCC) 08/24/2022   Acute respiratory distress 08/11/2022   Pneumonia due to COVID-19 virus 08/10/2022   Facial twitching 08/09/2022   CAP (community acquired pneumonia) 07/21/2022   Acute pulmonary edema (HCC) 07/20/2022   Hypervolemia associated with renal insufficiency 05/11/2022   Hyperkalemia 05/11/2022   Restless leg syndrome 04/23/2022   Anaphylactic shock, unspecified, initial encounter 09/07/2021    Gout 08/21/2021   GERD without esophagitis 08/21/2021   Anxiety and depression 08/21/2021   Neuropathic pain 05/26/2021   Dystonia 01/13/2021   Polyneuropathy associated with underlying disease (HCC) 01/13/2021   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   Other specified complication of vascular prosthetic devices, implants and grafts, initial encounter (HCC) 09/03/2020   Contact with and (suspected) exposure to covid-19 08/13/2020   Altered mental status, unspecified 06/25/2020   Other irregular eye movements 06/25/2020   PD catheter dysfunction (HCC) 04/26/2020   Other disorders of phosphorus metabolism 04/17/2020   Hypercalcemia 04/15/2020   Impacted cerumen of both ears 04/05/2020   Chronic gouty arthritis 04/05/2020   GAD (generalized anxiety disorder) 04/05/2020   History of hypertension 04/05/2020   Peripheral neuropathy 04/05/2020   Primary insomnia 04/05/2020   Malnutrition of moderate degree 03/13/2020   Hypertensive urgency 03/11/2020   Myoclonia 03/11/2020   Abscess of vulva 02/19/2020   Pericolonic abscess 02/06/2020   Chest pain 05/10/2019   Anemia 04/25/2019   SVT (supraventricular tachycardia) (HCC) 04/25/2019   Elevated troponin 04/25/2019   ESRD on dialysis (HCC) 04/25/2019   Bacterial infection, unspecified 04/17/2019   Pain, unspecified 04/17/2019   Pruritus, unspecified 04/17/2019   Benign paroxysmal positional vertigo of left ear 08/17/2018   Acute on chronic blood loss anemia 05/21/2018   Lower GI bleed 05/20/2018   Symptomatic anemia 10/28/2017   Acidosis 05/24/2017   Chills (without fever) 05/24/2017   Coagulation defect, unspecified (HCC) 05/24/2017   Dependence on renal dialysis (HCC) 05/24/2017   Encounter for fitting and adjustment of extracorporeal dialysis catheter (HCC) 05/24/2017   Fluid overload, unspecified 05/24/2017   Iron  deficiency anemia, unspecified 05/24/2017   Liver disease, unspecified 05/24/2017   Other disorders of electrolyte and fluid  balance, not elsewhere classified 05/24/2017   Other disorders resulting from impaired renal tubular function 05/24/2017   Other long term (current) drug therapy 05/24/2017   Secondary hyperparathyroidism of renal origin (HCC) 05/24/2017   Unspecified abnormal findings in urine 05/24/2017   Unspecified jaundice 05/24/2017   Chronic kidney disease 05/24/2017   Type 2 diabetes mellitus with other diabetic kidney complication (HCC) 05/24/2017   ESRD on peritoneal dialysis (HCC) 04/01/2016   Anemia due to chronic kidney disease 04/01/2016   GERD (gastroesophageal reflux disease) 04/01/2016   Atypical chest pain    Nephrotic syndrome 10/02/2013   Dyslipidemia 10/02/2013   PAT (paroxysmal atrial tachycardia) (HCC) 09/29/2013   Normocytic anemia 06/29/2013   Glomerulonephritis 01/28/2013   Dyspnea 07/04/2011   Morbid obesity (HCC) 07/04/2011   Essential hypertension 07/04/2011   Non-insulin  dependent type 2 diabetes mellitus (HCC) 07/04/2011   Hypercholesterolemia 07/04/2011    PCP: Jesus Griffes, MD   REFERRING PROVIDER: Jesus Griffes, MD   REFERRING DIAG: [phone] (ICD-10-CM) - Other specified enthesopathies of right lower limb, excluding foot   THERAPY DIAG:  No diagnosis found.  Rationale for Evaluation and Treatment: Rehabilitation  ONSET DATE: 01/31/23  SUBJECTIVE:   SUBJECTIVE STATEMENT: The pain in  the right leg is better than the other day. I am able to move my leg better.   EVAL: Pt reports she has chronic R leg pain, more lateral and on the front and back of her R thigh. She experiences burning, swelling and sharp pain. Pt also has minimal low back pain on the R and neuropathy of both feet. 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  PERTINENT HISTORY: Arthritis, CHF, anxiety, ESRD, DM2, neuropathy, high BMI  PAIN:  Are you having pain? Yes: NPRS scale: currently 4/10. Pain range on eval 6-10/10 Pain location: on the lateral ant  and post aspects of the R leg Pain description: Burning, sharp, swelling Aggravating factors: Prolonged time on feet, lying on R side Relieving factors: Massager, rest  PRECAUTIONS: None  RED FLAGS: None   WEIGHT BEARING RESTRICTIONS: No  FALLS:  Has patient fallen in last 6 months? No  LIVING ENVIRONMENT: Lives with: lives with their family Lives in: House/apartment Stairs: No Able to access home  OCCUPATION: Disability  PLOF: Independent  PATIENT GOALS: Pain relief  NEXT MD VISIT: 07/1023  OBJECTIVE:  Note: Objective measures were completed at Evaluation unless otherwise noted.  DIAGNOSTIC FINDINGS:   MRI Findings: There is focal fat deposition/small intramuscular lipoma within the proximal right adductor magnus (7:29). Mild fatty atrophy of the visualized right thigh musculature. Insertional gluteus minimus and medius tendinosis. Right common hamstring origin tendinosis. Mild intramuscular edema within the quadratus femoris with narrowed ischiofemoral interval. Tendinosis of the distal obturator externus. The hip and knee are approximated on the large field-of-view images. Degenerative subchondral signal changes with cartilage thinning at the knee. Trace right knee effusion. The neurovascular bundles are normal in course and contour. There is mild subcutaneous edema in the superficial soft tissues of the lateral right hip. No suspicious enhancing soft tissue or osseous lesion within the right thigh. No rim-enhancing fluid collection. Partially visualized postsurgical changes in the right pelvis.   PATIENT SURVEYS:  LEFS: 31/80= 39%  COGNITION: Overall cognitive status: Within functional limits for tasks assessed     SENSATION: WFL  EDEMA:  Swelling of the R thigh  MUSCLE LENGTH: Hamstrings: Right limited due to pain deg; Left WNLs deg Debby test: Right WNLs deg; Left WNLs deg  POSTURE: rounded shoulders, forward head, and flexed trunk   PALPATION: Markedly  TTP to the Ant/lat R thigh, R greater trochanter area, post. thigh T LOWER EXTREMITY ROM:  Active ROM Right eval Left eval Right 07/28/23  Hip flexion 70 pain  90+  Hip extension     Hip abduction 25 pain    Hip adduction     Hip internal rotation     Hip external rotation 20 pain    Knee flexion     Knee extension     Ankle dorsiflexion     Ankle plantarflexion     Ankle inversion     Ankle eversion      (Blank rows = not tested)  LOWER EXTREMITY MMT:  All MMT for the R leg were limited by pain MMT Right eval Left eval  Hip flexion 2   Hip extension 2   Hip abduction 2   Hip adduction 2   Hip internal rotation 2   Hip external rotation 2   Knee flexion 3   Knee extension 3   Ankle dorsiflexion 3   Ankle plantarflexion 2   Ankle inversion    Ankle eversion     (Blank rows =  not tested)  LOWER EXTREMITY SPECIAL TESTS:  NT  FUNCTIONAL TESTS:  5 times sit to stand: TBA 2 minute walk test: TBA 07/15/23 14.9 sec  5 x STS  with UE 356 feet 2 MWT  GAIT: Distance walked: 170ft Assistive device utilized: None Level of assistance: Complete Independence Comments: Antalgic gait pattern                                                                                                                                TREATMENT DATE:    [facility] Adult PT Treatment:                                                DATE: 08/10/23 Therapeutic Exercise: *** Manual Therapy: *** Neuromuscular re-ed: *** Therapeutic Activity: *** Modalities: *** Self Care: ***  [facility] Adult PT Treatment:                                                DATE: 08/06/23 Therapeutic Exercise/ Activity Nustep L4 UE/LE x 7 minutes  Suppoerted squats at FM bar x 10 Heel raises Hamstring curl right, standing Gastroc stretch each side  Standing high knee March  Standing hip abduction and extension, 10 x each bilat Supine right hip flexor stretch  Bridge 2 x 10  SAQ 10 x 2 bilat  Ball squeeze 10 x 2   LTR 10 x 2  SLR x 10 right  Side lying right clam 2 x 10  Seated LAQ 10 x 2   Modalities: Cold pack x 15 minutes to right thigh     [facility] Adult PT Treatment:                                                DATE: 08/04/23 Therapeutic Exercise: Nustep L4 UE/LE x 5 minutes  Passive right LE - SKTC, hip abduction, ITB stretch, h/s stretch Hip flexor stretch Mini Bridge 2 x 10  Supine march -small Rom  10 x 2  Supine clam AROM 10 x 2  Right heel slide on slide board x 5  Right quad set 10 x 2  Modalities: Cold pack x 15 minutes to right thigh     [facility] Adult PT Treatment:                                                DATE: 07/28/23 Therapeutic Exercise: LAQ 2#  10 x 2  Seated hip flexion 10 x 2 2#  STS 5 x 2  T.M. 1.5 mph x 5 minutes  Seated Leg press 45# x 10  Bridge x 10  LTR 10 x 2    [facility] Adult PT Treatment:                                                DATE: 07/27/23 Therapeutic Exercise: Nustep L4 x 5 min LAQ- full ext 10 x 3 Lefr, 10 x 2 right  Knee flexion GTB 10 x 2 SLR left x 10  SLR right x 10  SAQ bilat 10 x 3  Mini bridge- gluteal sets in hook lying 2 x 10  Ball squeeze 2 x 10 S/L right clam AROM 10 x 1, 10 x 5  LTR 10 x 2  Right hip flexor stretch , EOM 15 sec x 2   Modalities: Ice pack  right thigh    [facility] Adult PT Treatment:                                                DATE: 07/22/23 Therapeutic Exercise: Nustep  LAQ- full ext 10 x 2  Knee flexion GTB 10 x 2  SLR x 5 - mid range Quad set x 10 Supine heel slide x 10  Ball squeeze S/L clam AROM Mini bridge- gluteal sets in hook lying x 10  LTR   Modalities: HMP right thigh     PATIENT EDUCATION:  Education details: Eval findings, POC, HEP, self care  Person educated: Patient Education method: Explanation, Demonstration, Tactile cues, Verbal cues, and Handouts Education comprehension: verbalized understanding, returned demonstration, verbal cues required, and tactile cues  required  HOME EXERCISE PROGRAM: Access Code: [email or web address] URL: [email or web address] Date: 07/21/2023 Prepared by: Dasie Daft  Exercises - Supine Quad Set  - 1 x daily - 7 x weekly - 1 sets - 10 reps - 5 hold - Supine Heel Slides  - 1 x daily - 7 x weekly - 1 sets - 10 reps - 3 hold - Supine Isometric Hamstring Set  - 1 x daily - 7 x weekly - 1 sets - 10 reps - 5 hold - Supine Hip Abduction with Resistance at Ankles  - 1 x daily - 7 x weekly - 1 sets - 10 reps - 5 hold - Supine Gluteal Sets  - 1 x daily - 7 x weekly - 1 sets - 10 reps - 5 hold - Hooklying Bilateral Isometric Clamshell  - 1 x daily - 7 x weekly - 1 sets - 10 reps - 5 hold - Supine Knee Extension Strengthening  - 1 x daily - 7 x weekly - 1 sets - 10 reps - 5 hold  ASSESSMENT:  CLINICAL IMPRESSION: Pt able to resume more AROM and progress to closed chain exercises today. Pain is decreased since last visit with heat/ice and rest over this week. Tolerated closed chain progression well. Did report LE fatigue by end of session.    07/28/23: PT was completed for strengthening and activity tolerance, progressing to Treadmill and leg press. Pt is going out of town this weekend and hopes to be able to keep up with family.  She is interested in joining gym when it is appropriate. Will continue to introduce gym machines to prepare pt for transition to community exercise.    Pt tolerated the prescribed exs today without adverse effects, only fatigue. Pt will continue to benefit from skilled PT to address impairments for improved function.   EVAL :Patient is a 48 y.o. female who was seen today for physical therapy evaluation and treatment for M76.891 (ICD-10-CM) - Other specified enthesopathies of right lower limb, excluding foot. Pt presents to PT with decreased R LE AROM, strength, and function consistent with the referring Dx. Per LEFS, pt's rates the functional use of the R LE as low, 39%. Pt will benefit from skilled PT to  address impairments to optimize function with less pain.   OBJECTIVE IMPAIRMENTS: decreased activity tolerance, decreased mobility, difficulty walking, decreased ROM, decreased strength, increased edema, obesity, and pain.   ACTIVITY LIMITATIONS: carrying, lifting, bending, sitting, standing, squatting, sleeping, stairs, transfers, bed mobility, bathing, toileting, dressing, locomotion level, and caring for others  PARTICIPATION LIMITATIONS: meal prep, cleaning, laundry, driving, shopping, and community activity  PERSONAL FACTORS: Fitness, Past/current experiences, Time since onset of injury/illness/exacerbation, and 3+ comorbidities: Arthritis, CHF, anxiety, ESRD, DM2, neuropathy, high BMI are also affecting patient's functional outcome.   REHAB POTENTIAL: Good  CLINICAL DECISION MAKING: Evolving/moderate complexity  EVALUATION COMPLEXITY: Moderate   GOALS:   SHORT TERM GOALS: Target date: 07/30/23 Pt will be Ind in an initial HEP  Baseline: started Goal status: ONGOING  2.  Pt will voice understanding of measures to assist in pain reduction  Baseline: started Goal status: ONGOING  3.  Increase R hip flexion AROM to 90d for improved function and as indication of improved pain Baseline:  Goal status: MET   LONG TERM GOALS: Target date: 09/03/23  Pt will be Ind in a final HEP to maintain achieved LOF  Baseline:  Goal status: INITIAL  2.  Increased R hip AROM to flexion 100d, ER 35d, and abd to 40d for improved function with a normalized gait pattern and as indication of improved pain. Baseline: 70, 20, and 25 respectively Goal status: INITIAL  3.  Increase R hip strength to 3/5 and knee strength to 4/5 for improved functional use of the R LE with a normalized gait pattern Baseline:  Goal status: INITIAL  4.  Improve 5xSTS by MCID of 5 and by MCID of 25ft as indication of improved functional mobility  Baseline: TBA 7/10/5: 356 feet and 14.9 sec using UE Goal  status: INITIAL  5.  Pt's LEFS score will improve by the MCID to 54% as indication of improved function  Baseline: 39% Goal status: INITIAL   PLAN:  PT FREQUENCY: 2x/week  PT DURATION: 6 weeks  PLANNED INTERVENTIONS: 97164- PT Re-evaluation, 97110-Therapeutic exercises, 97530- Therapeutic activity, 97112- Neuromuscular re-education, 97535- Self Care, [id number]- Manual therapy, [id number]- Gait training, [phone]- Aquatic Therapy, [phone]- Electrical stimulation (unattended), 97016- Vasopneumatic device, 20560 (1-2 muscles), 20561 (3+ muscles)- Dry Needling, Patient/Family education, Balance training, Stair training, Taping, Joint mobilization, Cryotherapy, and Moist heat  PLAN FOR NEXT SESSION: ; assess response to HEP; progress therex as indicated; use of modalities, manual therapy; and TPDN as indicated.   Harlene Persons, PTA 08/09/23 12:22 PM Phone: [phone] Fax: [phone]

## 2023-08-10 ENCOUNTER — Ambulatory Visit: Admitting: Physical Therapy

## 2023-08-11 ENCOUNTER — Other Ambulatory Visit
Admission: RE | Admit: 2023-08-11 | Discharge: 2023-08-11 | Disposition: A | Discharge status: Home / Self Care | Attending: Nephrology | Admitting: Nephrology

## 2023-08-11 DIAGNOSIS — B259 Cytomegaloviral disease, unspecified: Secondary | ICD-10-CM | POA: Insufficient documentation

## 2023-08-11 DIAGNOSIS — Z9483 Pancreas transplant status: Secondary | ICD-10-CM | POA: Insufficient documentation

## 2023-08-11 DIAGNOSIS — Z09 Encounter for follow-up examination after completed treatment for conditions other than malignant neoplasm: Secondary | ICD-10-CM | POA: Diagnosis not present

## 2023-08-11 DIAGNOSIS — Z79899 Other long term (current) drug therapy: Secondary | ICD-10-CM | POA: Insufficient documentation

## 2023-08-11 DIAGNOSIS — Z114 Encounter for screening for human immunodeficiency virus [HIV]: Secondary | ICD-10-CM | POA: Diagnosis not present

## 2023-08-11 DIAGNOSIS — D631 Anemia in chronic kidney disease: Secondary | ICD-10-CM | POA: Insufficient documentation

## 2023-08-11 DIAGNOSIS — T8610 Unspecified complication of kidney transplant: Secondary | ICD-10-CM | POA: Insufficient documentation

## 2023-08-11 DIAGNOSIS — D899 Disorder involving the immune mechanism, unspecified: Secondary | ICD-10-CM | POA: Insufficient documentation

## 2023-08-11 DIAGNOSIS — Z789 Other specified health status: Secondary | ICD-10-CM | POA: Insufficient documentation

## 2023-08-11 DIAGNOSIS — N390 Urinary tract infection, site not specified: Secondary | ICD-10-CM | POA: Diagnosis not present

## 2023-08-11 DIAGNOSIS — E559 Vitamin D deficiency, unspecified: Secondary | ICD-10-CM | POA: Diagnosis not present

## 2023-08-11 DIAGNOSIS — N189 Chronic kidney disease, unspecified: Secondary | ICD-10-CM | POA: Diagnosis not present

## 2023-08-11 DIAGNOSIS — E1129 Type 2 diabetes mellitus with other diabetic kidney complication: Secondary | ICD-10-CM | POA: Diagnosis not present

## 2023-08-11 LAB — URINALYSIS, ROUTINE W REFLEX MICROSCOPIC
Bilirubin Urine: NEGATIVE
Glucose, UA: NEGATIVE mg/dL
Hgb urine dipstick: NEGATIVE
Ketones, ur: NEGATIVE mg/dL
Leukocytes,Ua: NEGATIVE
Nitrite: NEGATIVE
Protein, ur: >=300 mg/dL — AB
Specific Gravity, Urine: 1.024 (ref 1.005–1.030)
pH: 5 (ref 5.0–8.0)

## 2023-08-11 LAB — CBC WITH DIFFERENTIAL/PLATELET
Abs Immature Granulocytes: 0.02 K/uL (ref 0.00–0.07)
Basophils Absolute: 0 K/uL (ref 0.0–0.1)
Basophils Relative: 1 %
Eosinophils Absolute: 0.1 K/uL (ref 0.0–0.5)
Eosinophils Relative: 2 %
HCT: 38.3 % (ref 36.0–46.0)
Hemoglobin: 11.2 g/dL — ABNORMAL LOW (ref 12.0–15.0)
Immature Granulocytes: 1 %
Lymphocytes Relative: 19 %
Lymphs Abs: 0.8 K/uL (ref 0.7–4.0)
MCH: 23 pg — ABNORMAL LOW (ref 26.0–34.0)
MCHC: 29.2 g/dL — ABNORMAL LOW (ref 30.0–36.0)
MCV: 78.6 fL — ABNORMAL LOW (ref 80.0–100.0)
Monocytes Absolute: 0.4 K/uL (ref 0.1–1.0)
Monocytes Relative: 9 %
Neutro Abs: 2.8 K/uL (ref 1.7–7.7)
Neutrophils Relative %: 68 %
Platelets: 271 K/uL (ref 150–400)
RBC: 4.87 MIL/uL (ref 3.87–5.11)
RDW: 15.3 % (ref 11.5–15.5)
WBC: 4 K/uL (ref 4.0–10.5)
nRBC: 0 % (ref 0.0–0.2)

## 2023-08-11 LAB — BASIC METABOLIC PANEL WITH GFR
Anion gap: 11 (ref 5–15)
BUN: 24 mg/dL — ABNORMAL HIGH (ref 6–20)
CO2: 24 mmol/L (ref 22–32)
Calcium: 10.3 mg/dL (ref 8.9–10.3)
Chloride: 106 mmol/L (ref 98–111)
Creatinine, Ser: 1.28 mg/dL — ABNORMAL HIGH (ref 0.44–1.00)
GFR, Estimated: 51 mL/min — ABNORMAL LOW (ref >60)
Glucose, Bld: 78 mg/dL (ref 70–99)
Potassium: 4.5 mmol/L (ref 3.5–5.1)
Sodium: 141 mmol/L (ref 135–145)

## 2023-08-11 LAB — PROTEIN / CREATININE RATIO, URINE
Creatinine, Urine: 197 mg/dL
Protein Creatinine Ratio: 0.94 mg/mg{creat} — ABNORMAL HIGH (ref 0.00–0.15)
Total Protein, Urine: 186 mg/dL

## 2023-08-11 LAB — MAGNESIUM: Magnesium: 1.5 mg/dL — ABNORMAL LOW (ref 1.7–2.4)

## 2023-08-11 LAB — PHOSPHORUS: Phosphorus: 2.8 mg/dL (ref 2.5–4.6)

## 2023-08-12 ENCOUNTER — Ambulatory Visit: Admitting: Physical Therapy

## 2023-08-12 ENCOUNTER — Encounter: Payer: Self-pay | Admitting: Physical Therapy

## 2023-08-12 DIAGNOSIS — R262 Difficulty in walking, not elsewhere classified: Secondary | ICD-10-CM | POA: Diagnosis not present

## 2023-08-12 DIAGNOSIS — M79604 Pain in right leg: Secondary | ICD-10-CM

## 2023-08-12 DIAGNOSIS — R600 Localized edema: Secondary | ICD-10-CM

## 2023-08-12 LAB — URINE CULTURE: Culture: <10000 — AB

## 2023-08-12 NOTE — Therapy (Signed)
 [facility] LOWER EXTREMITY TREATMENT    [name] MRN: [id number] DOB:01-01-1976, 48 y.o., female Today's Date: 08/12/2023  END OF SESSION:  PT End of Session - 08/12/23 0851     Visit Number 9    Number of Visits 13    Date for PT Re-Evaluation 09/03/23    Authorization Type [employer] DUAL [employer]PLETE    Authorization - Visit Number 9    Authorization - Number of Visits 27    PT Start Time 0848    PT Stop Time 0935    PT Time Calculation (min) 47 min    Activity Tolerance Patient tolerated treatment well    Behavior During Therapy [facility] for tasks assessed/performed             Past Medical History:  Diagnosis Date   Acid reflux    Anemia 04/2019   REQUIRING BLOOD TRANSFUSIONS   Anxiety    Arthritis    CHF (congestive heart failure) (HCC)    Dyspnea    with exertion   Dysrhythmia    tachycardia   ESRD (end stage renal disease) (HCC)    [facility] [place]   Facial twitching 11/2020   mouth twitch, eyes roll back, neurology started patient on low dose   Sinemet  IR.   GI bleeding 12/2017   Gout    Headache(784.0)    related to chemo; sometimes weekly (09/12/2013)   Heart murmur    Systolic per Dr Salena Negri' notes   High cholesterol    History of blood transfusion a couple   related to low counts   Hypertension    Iron  deficiency anemia    get epogen shots q month (02/20/2014)   MCGN (minimal change glomerulonephritis)    using chemo to tx;  finished my last tx in 12/2013   Neuropathy    Peritoneal dialysis status (HCC)    Stroke (HCC) 08/15/2017   ruled out that it was not a stroke   Past Surgical History:  Procedure Laterality Date   ABDOMINAL HYSTERECTOMY  2010   laparoscopic   ANKLE FRACTURE SURGERY Right 1994   AV FISTULA PLACEMENT Left 04/14/2016   Procedure: ARTERIOVENOUS (AV) FISTULA CREATION;  Surgeon: Lonni GORMAN Blade, MD;  Location: [facility] OR;  Service: Vascular;  Laterality: Left;    AV FISTULA PLACEMENT Left 08/09/2017   Procedure: INSERTION OF ARTERIOVENOUS (AV) GORE-TEX GRAFT LEFT ARM;  Surgeon: Harvey Carlin BRAVO, MD;  Location: [facility] OR;  Service: Vascular;  Laterality: Left;   AV FISTULA PLACEMENT Right 03/15/2020   Procedure: RIGHT ARM ARTERIOVENOUS (AV) FISTULA CREATION 1st Stage Basilic Transposition;  Surgeon: Serene Gaile ORN, MD;  Location: [facility] OR;  Service: Vascular;  Laterality: Right;   AV FISTULA PLACEMENT Right 02/24/2021   Procedure: RIGHT ARM ARTERIOVENOUS  FISTULA CREATION;  Surgeon: Lanis Fonda BRAVO, MD;  Location: [facility] OR;  Service: Vascular;  Laterality: Right;  regional block   AVGG REMOVAL Left 08/11/2017   Procedure: REMOVAL OF ARTERIOVENOUS GORETEX GRAFT (AVGG);  Surgeon: Serene Gaile ORN, MD;  Location: [facility] OR;  Service: Vascular;  Laterality: Left;   BASCILIC VEIN TRANSPOSITION Right 05/17/2020   Procedure: RIGHT UPPER EXTREMITY SECOND STAGE BASCILIC VEIN TRANSPOSITION;  Surgeon: Serene Gaile ORN, MD;  [facility]: [facility] OR;  Service: Vascular;  Laterality: Right;   BIOPSY  09/03/2017   Procedure: BIOPSY;  Surgeon: Elicia Claw, MD;  [facility]: [facility] ENDOSCOPY;  Service: Gastroenterology;;   BIOPSY  12/24/2017   Procedure: BIOPSY;  Surgeon: Wilhelmenia Roers  Mickey., MD;  Location: [facility] ENDOSCOPY;  Service: Gastroenterology;;   CARDIAC CATHETERIZATION  2000's   COLONOSCOPY N/A 10/22/2022   Procedure: COLONOSCOPY;  Surgeon: Maryruth Ole DASEN, MD;  Location: [facility] ENDOSCOPY;  Service: Endoscopy;  Laterality: N/A;   COLONOSCOPY WITH PROPOFOL  N/A 09/04/2017   Procedure: COLONOSCOPY WITH PROPOFOL ;  Surgeon: Saintclair Jasper, MD;  Location: [facility] ENDOSCOPY;  Service: Gastroenterology;  Laterality: N/A;   ENTEROSCOPY N/A 12/24/2017   Procedure: ENTEROSCOPY;  Surgeon: Wilhelmenia Aloha Mickey., MD;  Location: [facility] ENDOSCOPY;  Service: Gastroenterology;  Laterality: N/A;   ESOPHAGOGASTRODUODENOSCOPY     ESOPHAGOGASTRODUODENOSCOPY (EGD) WITH PROPOFOL  Left 06/04/2017   Procedure:  ESOPHAGOGASTRODUODENOSCOPY (EGD) WITH PROPOFOL ;  Surgeon: Burnette Fallow, MD;  Location: [facility] ENDOSCOPY;  Service: Endoscopy;  Laterality: Left;   ESOPHAGOGASTRODUODENOSCOPY (EGD) WITH PROPOFOL  N/A 09/03/2017   Procedure: ESOPHAGOGASTRODUODENOSCOPY (EGD) WITH PROPOFOL ;  Surgeon: Elicia Claw, MD;  [facility] ENDOSCOPY;  Service: Gastroenterology;  Laterality: N/A;   ESOPHAGOGASTRODUODENOSCOPY (EGD) WITH PROPOFOL  N/A 04/27/2022   Procedure: ESOPHAGOGASTRODUODENOSCOPY (EGD) WITH PROPOFOL ;  Surgeon: Maryruth Ole DASEN, MD;  Location: [facility] ENDOSCOPY;  Service: Endoscopy;  Laterality: N/A;   FISTULA SUPERFICIALIZATION Left 04/02/2017   Procedure: FISTULA SUPERFICIALIZATION LEFT ARM;  Surgeon: Serene Gaile ORN, MD;  Location: [facility] OR;  Service: Vascular;  Laterality: Left;   FISTULA SUPERFICIALIZATION Right 04/14/2021   Procedure: RIGHT ARM FISTULA SUPERFICIALIZATION;  Surgeon: Lanis Fonda BRAVO, MD;  Location: [facility] OR;  Service: Vascular;  Laterality: Right;  PERIPHERAL NERVE BLOCK   FISTULOGRAM Left 04/07/2016   Procedure: FISTULOGRAM;  Surgeon: Penne Lonni Colorado, MD;  Location: [facility] OR;  Service: Vascular;  Laterality: Left;   FRACTURE SURGERY     right ankle   GIVENS CAPSULE STUDY N/A 10/29/2017   Procedure: GIVENS CAPSULE STUDY;  Surgeon: Dianna Specking, MD;  Location: [facility] ENDOSCOPY;  Service: Endoscopy;  Laterality: N/A;   GIVENS CAPSULE STUDY N/A 12/24/2017   Procedure: GIVENS CAPSULE STUDY;  Surgeon: Wilhelmenia Aloha Mickey., MD;  Location: [facility] ENDOSCOPY;  Service: Gastroenterology;  Laterality: N/A;   GIVENS CAPSULE STUDY N/A 05/22/2018   Procedure: GIVENS CAPSULE STUDY;  Surgeon: Wilhelmenia Aloha Mickey., MD;  Location: [facility] ENDOSCOPY;  Service: Gastroenterology;  Laterality: N/A;   INSERTION OF DIALYSIS CATHETER Right 04/07/2016   Procedure: INSERTION OF DIALYSIS CATHETER;  Surgeon: Penne Lonni Colorado, MD;  Location: [facility] OR;  Service: Vascular;  Laterality: Right;   INSERTION OF DIALYSIS  CATHETER Right 04/04/2017   Procedure: INSERTION OF DIALYSIS CATHETER;  Surgeon: Colorado Penne Lonni, MD;  Location: [facility] OR;  Service: Vascular;  Laterality: Right;   IR FLUORO GUIDE CV LINE LEFT  03/13/2020   IR US  GUIDE VASC ACCESS LEFT  03/13/2020   LEFT HEART CATH AND CORONARY ANGIOGRAPHY N/A 05/12/2019   Procedure: LEFT HEART CATH AND CORONARY ANGIOGRAPHY;  Surgeon: Claudene Pacific, MD;  [facility] INVASIVE CV LAB;  Service: Cardiovascular;  Laterality: N/A;   LIGATION OF ARTERIOVENOUS  FISTULA Left 04/14/2016   Procedure: LIGATION OF ARTERIOVENOUS  FISTULA;  Surgeon: Lonni GORMAN Blade, MD;  Location: [facility] OR;  Service: Vascular;  Laterality: Left;   LIGATION OF COMPETING BRANCHES OF ARTERIOVENOUS FISTULA Right 05/17/2020   Procedure: LIGATION OF COMPETING BRANCHES OF RIGHT ARM ARTERIOVENOUS FISTULA;  Surgeon: Serene Gaile ORN, MD;  Location: [facility] OR;  Service: Vascular;  Laterality: Right;   PATCH ANGIOPLASTY Left 06/19/2016   Procedure: PATCH ANGIOPLASTY LEFT ARTERIOVENOUS FISTULA;  Surgeon: Blade Lonni GORMAN, MD;  Location: [facility] OR;  Service: Vascular;  Laterality: Left;   POLYPECTOMY  09/04/2017  Procedure: POLYPECTOMY;  Surgeon: Saintclair Jasper, MD;  Location: [facility] ENDOSCOPY;  Service: Gastroenterology;;   REVISON OF ARTERIOVENOUS FISTULA Left 06/19/2016   Procedure: REVISION SUPERFICIALIZATION OF BRACHIOCEPHALIC ARTERIOVENOUS FISTULA;  Surgeon: Eliza Lonni RAMAN, MD;  Location: [facility] OR;  Service: Vascular;  Laterality: Left;   UPPER EXTREMITY VENOGRAPHY Bilateral 02/19/2021   Procedure: UPPER EXTREMITY VENOGRAPHY;  Surgeon: Lanis Fonda BRAVO, MD;  Location: [facility] INVASIVE CV LAB;  Service: Cardiovascular;  Laterality: Bilateral;   Patient Active Problem List   Diagnosis Date Noted   Gastroparesis 12/31/2022   OSA on CPAP 12/31/2022   Rectal bleeding 10/23/2022   Acute on chronic anemia 10/19/2022   Obesity (BMI 30-39.9) 08/25/2022   Acute encephalopathy 08/24/2022   (HFpEF) heart failure  with preserved ejection fraction (HCC) 08/24/2022   Acute respiratory distress 08/11/2022   Pneumonia due to COVID-19 virus 08/10/2022   Facial twitching 08/09/2022   CAP (community acquired pneumonia) 07/21/2022   Acute pulmonary edema (HCC) 07/20/2022   Hypervolemia associated with renal insufficiency 05/11/2022   Hyperkalemia 05/11/2022   Restless leg syndrome 04/23/2022   Anaphylactic shock, unspecified, initial encounter 09/07/2021   Gout 08/21/2021   GERD without esophagitis 08/21/2021   Anxiety and depression 08/21/2021   Neuropathic pain 05/26/2021   Dystonia 01/13/2021   Polyneuropathy associated with underlying disease (HCC) 01/13/2021   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   Other specified complication of vascular prosthetic devices, implants and grafts, initial encounter (HCC) 09/03/2020   Contact with and (suspected) exposure to covid-19 08/13/2020   Altered mental status, unspecified 06/25/2020   Other irregular eye movements 06/25/2020   PD catheter dysfunction (HCC) 04/26/2020   Other disorders of phosphorus metabolism 04/17/2020   Hypercalcemia 04/15/2020   Impacted cerumen of both ears 04/05/2020   Chronic gouty arthritis 04/05/2020   GAD (generalized anxiety disorder) 04/05/2020   History of hypertension 04/05/2020   Peripheral neuropathy 04/05/2020   Primary insomnia 04/05/2020   Malnutrition of moderate degree 03/13/2020   Hypertensive urgency 03/11/2020   Myoclonia 03/11/2020   Abscess of vulva 02/19/2020   Pericolonic abscess 02/06/2020   Chest pain 05/10/2019   Anemia 04/25/2019   SVT (supraventricular tachycardia) (HCC) 04/25/2019   Elevated troponin 04/25/2019   ESRD on dialysis (HCC) 04/25/2019   Bacterial infection, unspecified 04/17/2019   Pain, unspecified 04/17/2019   Pruritus, unspecified 04/17/2019   Benign paroxysmal positional vertigo of left ear 08/17/2018   Acute on chronic blood loss anemia 05/21/2018   Lower GI bleed 05/20/2018   Symptomatic  anemia 10/28/2017   Acidosis 05/24/2017   Chills (without fever) 05/24/2017   Coagulation defect, unspecified (HCC) 05/24/2017   Dependence on renal dialysis (HCC) 05/24/2017   Encounter for fitting and adjustment of extracorporeal dialysis catheter (HCC) 05/24/2017   Fluid overload, unspecified 05/24/2017   Iron  deficiency anemia, unspecified 05/24/2017   Liver disease, unspecified 05/24/2017   Other disorders of electrolyte and fluid balance, not elsewhere classified 05/24/2017   Other disorders resulting from impaired renal tubular function 05/24/2017   Other long term (current) drug therapy 05/24/2017   Secondary hyperparathyroidism of renal origin (HCC) 05/24/2017   Unspecified abnormal findings in urine 05/24/2017   Unspecified jaundice 05/24/2017   Chronic kidney disease 05/24/2017   Type 2 diabetes mellitus with other diabetic kidney complication (HCC) 05/24/2017   ESRD on peritoneal dialysis (HCC) 04/01/2016   Anemia due to chronic kidney disease 04/01/2016   GERD (gastroesophageal reflux disease) 04/01/2016   Atypical chest pain    Nephrotic syndrome 10/02/2013   Dyslipidemia 10/02/2013  PAT (paroxysmal atrial tachycardia) (HCC) 09/29/2013   Normocytic anemia 06/29/2013   Glomerulonephritis 01/28/2013   Dyspnea 07/04/2011   Morbid obesity (HCC) 07/04/2011   Essential hypertension 07/04/2011   Non-insulin  dependent type 2 diabetes mellitus (HCC) 07/04/2011   Hypercholesterolemia 07/04/2011    PCP: Jesus Griffes, MD   REFERRING PROVIDER: Jesus Griffes, MD   REFERRING DIAG: [phone] (ICD-10-CM) - Other specified enthesopathies of right lower limb, excluding foot   THERAPY DIAG:  Pain in right leg  Difficulty in walking, not elsewhere classified  Localized edema  Rationale for Evaluation and Treatment: Rehabilitation  ONSET DATE: 01/31/23  SUBJECTIVE:   SUBJECTIVE STATEMENT: Rt thigh gets swollen and tender. It is maybe a little stronger.  Denies  weakness with walking.     EVAL: Pt reports she has chronic R leg pain, more lateral and on the front and back of her R thigh. She experiences burning, swelling and sharp pain. Pt also has minimal low back pain on the R and neuropathy of both feet. 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  PERTINENT HISTORY: Arthritis, CHF, anxiety, ESRD, DM2, neuropathy, high BMI  PAIN:  Are you having pain? Yes: NPRS scale: currently 5/10. Pain range on eval 6-10/10 Pain location: on the lateral ant and post aspects of the R leg Pain description: Burning, sharp, swelling Aggravating factors: Prolonged time on feet, lying on R side Relieving factors: Massager, rest  PRECAUTIONS: None  RED FLAGS: None   WEIGHT BEARING RESTRICTIONS: No  FALLS:  Has patient fallen in last 6 months? No  LIVING ENVIRONMENT: Lives with: lives with their family Lives in: House/apartment Stairs: No Able to access home  OCCUPATION: Disability  PLOF: Independent  PATIENT GOALS: Pain relief  NEXT MD VISIT: 07/1023  OBJECTIVE:  Note: Objective measures were completed at Evaluation unless otherwise noted.  DIAGNOSTIC FINDINGS:   MRI Findings: There is focal fat deposition/small intramuscular lipoma within the proximal right adductor magnus (7:29). Mild fatty atrophy of the visualized right thigh musculature. Insertional gluteus minimus and medius tendinosis. Right common hamstring origin tendinosis. Mild intramuscular edema within the quadratus femoris with narrowed ischiofemoral interval. Tendinosis of the distal obturator externus. The hip and knee are approximated on the large field-of-view images. Degenerative subchondral signal changes with cartilage thinning at the knee. Trace right knee effusion. The neurovascular bundles are normal in course and contour. There is mild subcutaneous edema in the superficial soft tissues of the lateral right hip. No suspicious enhancing soft tissue or  osseous lesion within the right thigh. No rim-enhancing fluid collection. Partially visualized postsurgical changes in the right pelvis.   PATIENT SURVEYS:  LEFS: 31/80= 39%  COGNITION: Overall cognitive status: Within functional limits for tasks assessed     SENSATION: WFL  EDEMA:  Swelling of the R thigh  MUSCLE LENGTH: Hamstrings: Right limited due to pain deg; Left WNLs deg Debby test: Right WNLs deg; Left WNLs deg  POSTURE: rounded shoulders, forward head, and flexed trunk   PALPATION: Markedly TTP to the Ant/lat R thigh, R greater trochanter area, post. thigh T LOWER EXTREMITY ROM:  Active ROM Right eval Left eval Right 07/28/23 Rt 08/12/23  Hip flexion 70 pain  90+ 100 deg   Hip extension      Hip abduction 25 pain   30  Hip adduction      Hip internal rotation      Hip external rotation 20 pain   40  Knee flexion  Knee extension      Ankle dorsiflexion      Ankle plantarflexion      Ankle inversion      Ankle eversion       (Blank rows = not tested)  LOWER EXTREMITY MMT:  All MMT for the R leg were limited by pain MMT Right eval Left eval Rt. 08/12/23 Lt  08/12/23  Hip flexion 2  3+ 5  Hip extension 2     Hip abduction 2     Hip adduction 2     Hip internal rotation 2     Hip external rotation 2     Knee flexion 3  4- 5  Knee extension 3  4- 5  Ankle dorsiflexion 3  4 5   Ankle plantarflexion 2     Ankle inversion      Ankle eversion       (Blank rows = not tested)  LOWER EXTREMITY SPECIAL TESTS:  NT  FUNCTIONAL TESTS:  5 times sit to stand: TBA 2 minute walk test: TBA 07/15/23 14.9 sec  5 x STS  with UE 356 feet 2 MWT  GAIT: Distance walked: 150ft Assistive device utilized: None Level of assistance: Complete Independence Comments: Antalgic gait pattern                                                                                                                                TREATMENT DATE:    [facility] Adult PT Treatment:                                                 DATE: 08/12/23 Therapeutic Exercise: Knee to chest with towel Mini bridge with/without ball x 10 x 2 Quad set  Attempt SLR- unable > 1  Banded march green band  Banded clam unilateral and bilateral green Rt ant hip /quad stretch off table Sidelying stretch for Rt hip and trunk  Manual Therapy: IASTM to Rt ant, lateral and post thigh/hip  Modalities   10 min Rt ant hip    [facility] Adult PT Treatment:                                                DATE: 08/06/23 Therapeutic Exercise/ Activity Nustep L4 UE/LE x 7 minutes  Suppoerted squats at FM bar x 10 Heel raises Hamstring curl right, standing Gastroc stretch each side  Standing high knee March  Standing hip abduction and extension, 10 x each bilat Supine right hip flexor stretch  Bridge 2 x 10  SAQ 10 x 2 bilat  Ball squeeze 10 x 2  LTR 10 x 2  SLR x 10 right  Side  lying right clam 2 x 10  Seated LAQ 10 x 2   Modalities: Cold pack x 15 minutes to right thigh     [facility] Adult PT Treatment:                                                DATE: 08/04/23 Therapeutic Exercise: Nustep L4 UE/LE x 5 minutes  Passive right LE - SKTC, hip abduction, ITB stretch, h/s stretch Hip flexor stretch Mini Bridge 2 x 10  Supine march -small Rom  10 x 2  Supine clam AROM 10 x 2  Right heel slide on slide board x 5  Right quad set 10 x 2  Modalities: Cold pack x 15 minutes to right thigh     [facility] Adult PT Treatment:                                                DATE: 07/28/23 Therapeutic Exercise: LAQ 2# 10 x 2  Seated hip flexion 10 x 2 2#  STS 5 x 2  T.M. 1.5 mph x 5 minutes  Seated Leg press 45# x 10  Bridge x 10  LTR 10 x 2     PATIENT EDUCATION:  Education details: Eval findings, POC, HEP, self care  Person educated: Patient Education method: Explanation, Demonstration, Tactile cues, Verbal cues, and Handouts Education comprehension: verbalized understanding, returned demonstration,  verbal cues required, and tactile cues required  HOME EXERCISE PROGRAM: Access Code: [email or web address] URL: [email or web address] Date: 07/21/2023 Prepared by: Dasie Daft  Exercises - Supine Quad Set  - 1 x daily - 7 x weekly - 1 sets - 10 reps - 5 hold - Supine Heel Slides  - 1 x daily - 7 x weekly - 1 sets - 10 reps - 3 hold - Supine Isometric Hamstring Set  - 1 x daily - 7 x weekly - 1 sets - 10 reps - 5 hold - Supine Hip Abduction with Resistance at Ankles  - 1 x daily - 7 x weekly - 1 sets - 10 reps - 5 hold - Supine Gluteal Sets  - 1 x daily - 7 x weekly - 1 sets - 10 reps - 5 hold - Hooklying Bilateral Isometric Clamshell  - 1 x daily - 7 x weekly - 1 sets - 10 reps - 5 hold - Supine Knee Extension Strengthening  - 1 x daily - 7 x weekly - 1 sets - 10 reps - 5 hold  ASSESSMENT:  CLINICAL IMPRESSION: Patient has improved her strength and AROM. Still have moderate tightness and pain in Rt thigh with palpable tension/knots.  She will make more visits in order to finish her POC. Will cont to progress functional strengthening within limits of pain .    EVAL :Patient is a 48 y.o. female who was seen today for physical therapy evaluation and treatment for M76.891 (ICD-10-CM) - Other specified enthesopathies of right lower limb, excluding foot. Pt presents to PT with decreased R LE AROM, strength, and function consistent with the referring Dx. Per LEFS, pt's rates the functional use of the R LE as low, 39%. Pt will benefit from skilled PT to address impairments to optimize function with less pain.   OBJECTIVE  IMPAIRMENTS: decreased activity tolerance, decreased mobility, difficulty walking, decreased ROM, decreased strength, increased edema, obesity, and pain.   ACTIVITY LIMITATIONS: carrying, lifting, bending, sitting, standing, squatting, sleeping, stairs, transfers, bed mobility, bathing, toileting, dressing, locomotion level, and caring for others  PARTICIPATION LIMITATIONS:  meal prep, cleaning, laundry, driving, shopping, and community activity  PERSONAL FACTORS: Fitness, Past/current experiences, Time since onset of injury/illness/exacerbation, and 3+ comorbidities: Arthritis, CHF, anxiety, ESRD, DM2, neuropathy, high BMI are also affecting patient's functional outcome.   REHAB POTENTIAL: Good  CLINICAL DECISION MAKING: Evolving/moderate complexity  EVALUATION COMPLEXITY: Moderate   GOALS:   SHORT TERM GOALS: Target date: 07/30/23 Pt will be Ind in an initial HEP  Baseline: started Goal status: MET   2.  Pt will voice understanding of measures to assist in pain reduction  Baseline: OTC tylenol , ice/heat, elevate leg Goal status: MET   3.  Increase R hip flexion AROM to 90d for improved function and as indication of improved pain Baseline:  Goal status: MET   LONG TERM GOALS: Target date: 09/03/23  Pt will be Ind in a final HEP to maintain achieved LOF  Baseline:  Goal status:ongoing   2.  Increased R hip AROM to flexion 100d, ER 35d, and abd to 40d for improved function with a normalized gait pattern and as indication of improved pain. Baseline: 70, 20, and 25 respectively Goal status:ongoing   3.  Increase R hip strength to 3/5 and knee strength to 4/5 for improved functional use of the R LE with a normalized gait pattern Baseline: see above Goal status: ongoing   4.  Improve 5xSTS by MCID of 5 and by MCID of 60ft as indication of improved functional mobility  Baseline: TBA 7/10/5: 356 feet and 14.9 sec using UE Goal status: ongoing   5.  Pt's LEFS score will improve by the MCID to 54% as indication of improved function  Baseline: 39% Goal status: ongoing    PLAN:  PT FREQUENCY: 2x/week  PT DURATION: 6 weeks  PLANNED INTERVENTIONS: 97164- PT Re-evaluation, 97110-Therapeutic exercises, 97530- Therapeutic activity, 97112- Neuromuscular re-education, 97535- Self Care, [id number]- Manual therapy, [id number]- Gait training, [phone]- Aquatic  Therapy, [phone]- Electrical stimulation (unattended), 97016- Vasopneumatic device, 20560 (1-2 muscles), 20561 (3+ muscles)- Dry Needling, Patient/Family education, Balance training, Stair training, Taping, Joint mobilization, Cryotherapy, and Moist heat  PLAN FOR NEXT SESSION: ; try more closed chain, nustep.  Rt LE strength and manual. assess response to HEP; progress therex as indicated; use of modalities, manual therapy; and TPDN as indicated.   Delon Norma, PT 08/12/23 9:33 AM Phone: [phone] Fax: [phone]

## 2023-08-13 LAB — TACROLIMUS LEVEL: Tacrolimus (FK506) - LabCorp: 7.8 ng/mL (ref 5.0–20.0)

## 2023-08-18 DIAGNOSIS — G4733 Obstructive sleep apnea (adult) (pediatric): Secondary | ICD-10-CM | POA: Diagnosis not present

## 2023-08-23 DIAGNOSIS — G63 Polyneuropathy in diseases classified elsewhere: Secondary | ICD-10-CM | POA: Diagnosis not present

## 2023-08-23 DIAGNOSIS — F5101 Primary insomnia: Secondary | ICD-10-CM | POA: Diagnosis not present

## 2023-08-23 DIAGNOSIS — N186 End stage renal disease: Secondary | ICD-10-CM | POA: Diagnosis not present

## 2023-08-25 ENCOUNTER — Encounter: Payer: Self-pay | Admitting: Physical Therapy

## 2023-08-25 ENCOUNTER — Ambulatory Visit: Admitting: Physical Therapy

## 2023-08-25 DIAGNOSIS — R262 Difficulty in walking, not elsewhere classified: Secondary | ICD-10-CM | POA: Diagnosis not present

## 2023-08-25 DIAGNOSIS — M79604 Pain in right leg: Secondary | ICD-10-CM | POA: Diagnosis not present

## 2023-08-25 DIAGNOSIS — R600 Localized edema: Secondary | ICD-10-CM | POA: Diagnosis not present

## 2023-08-25 NOTE — Therapy (Addendum)
 [facility] LOWER EXTREMITY TREATMENT/Pm   [name] MRN: [id number] DOB:08/04/1975, 48 y.o., female Today's Date: 08/25/2023  Progress Note Reporting Period 07/13/23 to 08/25/23  See note below for Objective Data and Assessment of Progress/Goals.      END OF SESSION:  PT End of Session - 08/25/23 1316     Visit Number 10    Number of Visits 13    Date for PT Re-Evaluation 09/03/23    Authorization Type [employer] DUAL COMPLETE    Authorization - Visit Number 10    Authorization - Number of Visits 27    PT Start Time 1315    PT Stop Time 1400    PT Time Calculation (min) 45 min             Past Medical History:  Diagnosis Date   Acid reflux    Anemia 04/2019   REQUIRING BLOOD TRANSFUSIONS   Anxiety    Arthritis    CHF (congestive heart failure) (HCC)    Dyspnea    with exertion   Dysrhythmia    tachycardia   ESRD (end stage renal disease) (HCC)    [facility] [place]   Facial twitching 11/2020   mouth twitch, eyes roll back, neurology started patient on low dose   Sinemet  IR.   GI bleeding 12/2017   Gout    Headache(784.0)    related to chemo; sometimes weekly (09/12/2013)   Heart murmur    Systolic per Dr Salena Negri' notes   High cholesterol    History of blood transfusion a couple   related to low counts   Hypertension    Iron  deficiency anemia    get epogen shots q month (02/20/2014)   MCGN (minimal change glomerulonephritis)    using chemo to tx;  finished my last tx in 12/2013   Neuropathy    Peritoneal dialysis status (HCC)    Stroke (HCC) 08/15/2017   ruled out that it was not a stroke   Past Surgical History:  Procedure Laterality Date   ABDOMINAL HYSTERECTOMY  2010   laparoscopic   ANKLE FRACTURE SURGERY Right 1994   AV FISTULA PLACEMENT Left 04/14/2016   Procedure: ARTERIOVENOUS (AV) FISTULA CREATION;  Surgeon: Lonni GORMAN Blade, MD;  Location: [facility] OR;  Service: Vascular;   Laterality: Left;   AV FISTULA PLACEMENT Left 08/09/2017   Procedure: INSERTION OF ARTERIOVENOUS (AV) GORE-TEX GRAFT LEFT ARM;  Surgeon: Harvey Carlin BRAVO, MD;  Location: [facility] OR;  Service: Vascular;  Laterality: Left;   AV FISTULA PLACEMENT Right 03/15/2020   Procedure: RIGHT ARM ARTERIOVENOUS (AV) FISTULA CREATION 1st Stage Basilic Transposition;  Surgeon: Serene Gaile ORN, MD;  Location: [facility] OR;  Service: Vascular;  Laterality: Right;   AV FISTULA PLACEMENT Right 02/24/2021   Procedure: RIGHT ARM ARTERIOVENOUS  FISTULA CREATION;  Surgeon: Lanis Fonda BRAVO, MD;  Location: [facility] OR;  Service: Vascular;  Laterality: Right;  regional block   AVGG REMOVAL Left 08/11/2017   Procedure: REMOVAL OF ARTERIOVENOUS GORETEX GRAFT (AVGG);  Surgeon: Serene Gaile ORN, MD;  Location: [facility] OR;  Service: Vascular;  Laterality: Left;   BASCILIC VEIN TRANSPOSITION Right 05/17/2020   Procedure: RIGHT UPPER EXTREMITY SECOND STAGE BASCILIC VEIN TRANSPOSITION;  Surgeon: Serene Gaile ORN, MD;  [facility]: [facility] OR;  Service: Vascular;  Laterality: Right;   BIOPSY  09/03/2017   Procedure: BIOPSY;  Surgeon: Elicia Claw, MD;  Location: [facility] ENDOSCOPY;  Service: Gastroenterology;;   BIOPSY  12/24/2017   Procedure: BIOPSY;  Surgeon: Wilhelmenia Aloha Raddle., MD;  Location: [facility] ENDOSCOPY;  Service: Gastroenterology;;   CARDIAC CATHETERIZATION  2000's   COLONOSCOPY N/A 10/22/2022   Procedure: COLONOSCOPY;  Surgeon: Maryruth Ole DASEN, MD;  Location: [facility] ENDOSCOPY;  Service: Endoscopy;  Laterality: N/A;   COLONOSCOPY WITH PROPOFOL  N/A 09/04/2017   Procedure: COLONOSCOPY WITH PROPOFOL ;  Surgeon: Saintclair Jasper, MD;  Location: [facility] ENDOSCOPY;  Service: Gastroenterology;  Laterality: N/A;   ENTEROSCOPY N/A 12/24/2017   Procedure: ENTEROSCOPY;  Surgeon: Wilhelmenia Aloha Raddle., MD;  Location: [facility] ENDOSCOPY;  Service: Gastroenterology;  Laterality: N/A;   ESOPHAGOGASTRODUODENOSCOPY     ESOPHAGOGASTRODUODENOSCOPY (EGD) WITH PROPOFOL  Left 06/04/2017    Procedure: ESOPHAGOGASTRODUODENOSCOPY (EGD) WITH PROPOFOL ;  Surgeon: Burnette Fallow, MD;  Location: [facility] ENDOSCOPY;  Service: Endoscopy;  Laterality: Left;   ESOPHAGOGASTRODUODENOSCOPY (EGD) WITH PROPOFOL  N/A 09/03/2017   Procedure: ESOPHAGOGASTRODUODENOSCOPY (EGD) WITH PROPOFOL ;  Surgeon: Elicia Claw, MD;  [facility] ENDOSCOPY;  Service: Gastroenterology;  Laterality: N/A;   ESOPHAGOGASTRODUODENOSCOPY (EGD) WITH PROPOFOL  N/A 04/27/2022   Procedure: ESOPHAGOGASTRODUODENOSCOPY (EGD) WITH PROPOFOL ;  Surgeon: Maryruth Ole DASEN, MD;  Location: [facility] ENDOSCOPY;  Service: Endoscopy;  Laterality: N/A;   FISTULA SUPERFICIALIZATION Left 04/02/2017   Procedure: FISTULA SUPERFICIALIZATION LEFT ARM;  Surgeon: Serene Gaile ORN, MD;  Location: [facility] OR;  Service: Vascular;  Laterality: Left;   FISTULA SUPERFICIALIZATION Right 04/14/2021   Procedure: RIGHT ARM FISTULA SUPERFICIALIZATION;  Surgeon: Lanis Fonda BRAVO, MD;  Location: [facility] OR;  Service: Vascular;  Laterality: Right;  PERIPHERAL NERVE BLOCK   FISTULOGRAM Left 04/07/2016   Procedure: FISTULOGRAM;  Surgeon: Penne Lonni Colorado, MD;  Location: [facility] OR;  Service: Vascular;  Laterality: Left;   FRACTURE SURGERY     right ankle   GIVENS CAPSULE STUDY N/A 10/29/2017   Procedure: GIVENS CAPSULE STUDY;  Surgeon: Dianna Specking, MD;  Location: [facility] ENDOSCOPY;  Service: Endoscopy;  Laterality: N/A;   GIVENS CAPSULE STUDY N/A 12/24/2017   Procedure: GIVENS CAPSULE STUDY;  Surgeon: Wilhelmenia Aloha Raddle., MD;  Location: [facility] ENDOSCOPY;  Service: Gastroenterology;  Laterality: N/A;   GIVENS CAPSULE STUDY N/A 05/22/2018   Procedure: GIVENS CAPSULE STUDY;  Surgeon: Wilhelmenia Aloha Raddle., MD;  Location: [facility] ENDOSCOPY;  Service: Gastroenterology;  Laterality: N/A;   INSERTION OF DIALYSIS CATHETER Right 04/07/2016   Procedure: INSERTION OF DIALYSIS CATHETER;  Surgeon: Penne Lonni Colorado, MD;  Location: [facility] OR;  Service: Vascular;  Laterality: Right;   INSERTION OF  DIALYSIS CATHETER Right 04/04/2017   Procedure: INSERTION OF DIALYSIS CATHETER;  Surgeon: [place] Penne Lonni, MD;  Location: [facility] OR;  Service: Vascular;  Laterality: Right;   IR FLUORO GUIDE CV LINE LEFT  03/13/2020   IR US  GUIDE VASC ACCESS LEFT  03/13/2020   LEFT HEART CATH AND CORONARY ANGIOGRAPHY N/A 05/12/2019   Procedure: LEFT HEART CATH AND CORONARY ANGIOGRAPHY;  Surgeon: Claudene Pacific, MD;  [facility] INVASIVE CV LAB;  Service: Cardiovascular;  Laterality: N/A;   LIGATION OF ARTERIOVENOUS  FISTULA Left 04/14/2016   Procedure: LIGATION OF ARTERIOVENOUS  FISTULA;  Surgeon: Lonni GORMAN Blade, MD;  Location: [facility] OR;  Service: Vascular;  Laterality: Left;   LIGATION OF COMPETING BRANCHES OF ARTERIOVENOUS FISTULA Right 05/17/2020   Procedure: LIGATION OF COMPETING BRANCHES OF RIGHT ARM ARTERIOVENOUS FISTULA;  Surgeon: Serene Gaile ORN, MD;  Location: [facility] OR;  Service: Vascular;  Laterality: Right;   PATCH ANGIOPLASTY Left 06/19/2016   Procedure: PATCH ANGIOPLASTY LEFT ARTERIOVENOUS FISTULA;  Surgeon: Blade Lonni GORMAN, MD;  Location: [facility] OR;  Service: Vascular;  Laterality: Left;   POLYPECTOMY  09/04/2017   Procedure: POLYPECTOMY;  Surgeon: Saintclair Jasper, MD;  Location: [facility] ENDOSCOPY;  Service: Gastroenterology;;   REVISON OF ARTERIOVENOUS FISTULA Left 06/19/2016   Procedure: REVISION SUPERFICIALIZATION OF BRACHIOCEPHALIC ARTERIOVENOUS FISTULA;  Surgeon: Eliza Lonni RAMAN, MD;  Location: [facility] OR;  Service: Vascular;  Laterality: Left;   UPPER EXTREMITY VENOGRAPHY Bilateral 02/19/2021   Procedure: UPPER EXTREMITY VENOGRAPHY;  Surgeon: Lanis Fonda BRAVO, MD;  Location: [facility] INVASIVE CV LAB;  Service: Cardiovascular;  Laterality: Bilateral;   Patient Active Problem List   Diagnosis Date Noted   Gastroparesis 12/31/2022   OSA on CPAP 12/31/2022   Rectal bleeding 10/23/2022   Acute on chronic anemia 10/19/2022   Obesity (BMI 30-39.9) 08/25/2022   Acute encephalopathy 08/24/2022   (HFpEF) heart  failure with preserved ejection fraction (HCC) 08/24/2022   Acute respiratory distress 08/11/2022   Pneumonia due to COVID-19 virus 08/10/2022   Facial twitching 08/09/2022   CAP (community acquired pneumonia) 07/21/2022   Acute pulmonary edema (HCC) 07/20/2022   Hypervolemia associated with renal insufficiency 05/11/2022   Hyperkalemia 05/11/2022   Restless leg syndrome 04/23/2022   Anaphylactic shock, unspecified, initial encounter 09/07/2021   Gout 08/21/2021   GERD without esophagitis 08/21/2021   Anxiety and depression 08/21/2021   Neuropathic pain 05/26/2021   Dystonia 01/13/2021   Polyneuropathy associated with underlying disease (HCC) 01/13/2021   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   Other specified complication of vascular prosthetic devices, implants and grafts, initial encounter (HCC) 09/03/2020   Contact with and (suspected) exposure to covid-19 08/13/2020   Altered mental status, unspecified 06/25/2020   Other irregular eye movements 06/25/2020   PD catheter dysfunction (HCC) 04/26/2020   Other disorders of phosphorus metabolism 04/17/2020   Hypercalcemia 04/15/2020   Impacted cerumen of both ears 04/05/2020   Chronic gouty arthritis 04/05/2020   GAD (generalized anxiety disorder) 04/05/2020   History of hypertension 04/05/2020   Peripheral neuropathy 04/05/2020   Primary insomnia 04/05/2020   Malnutrition of moderate degree 03/13/2020   Hypertensive urgency 03/11/2020   Myoclonia 03/11/2020   Abscess of vulva 02/19/2020   Pericolonic abscess 02/06/2020   Chest pain 05/10/2019   Anemia 04/25/2019   SVT (supraventricular tachycardia) (HCC) 04/25/2019   Elevated troponin 04/25/2019   ESRD on dialysis (HCC) 04/25/2019   Bacterial infection, unspecified 04/17/2019   Pain, unspecified 04/17/2019   Pruritus, unspecified 04/17/2019   Benign paroxysmal positional vertigo of left ear 08/17/2018   Acute on chronic blood loss anemia 05/21/2018   Lower GI bleed 05/20/2018    Symptomatic anemia 10/28/2017   Acidosis 05/24/2017   Chills (without fever) 05/24/2017   Coagulation defect, unspecified (HCC) 05/24/2017   Dependence on renal dialysis (HCC) 05/24/2017   Encounter for fitting and adjustment of extracorporeal dialysis catheter (HCC) 05/24/2017   Fluid overload, unspecified 05/24/2017   Iron  deficiency anemia, unspecified 05/24/2017   Liver disease, unspecified 05/24/2017   Other disorders of electrolyte and fluid balance, not elsewhere classified 05/24/2017   Other disorders resulting from impaired renal tubular function 05/24/2017   Other long term (current) drug therapy 05/24/2017   Secondary hyperparathyroidism of renal origin (HCC) 05/24/2017   Unspecified abnormal findings in urine 05/24/2017   Unspecified jaundice 05/24/2017   Chronic kidney disease 05/24/2017   Type 2 diabetes mellitus with other diabetic kidney complication (HCC) 05/24/2017   ESRD on peritoneal dialysis (HCC) 04/01/2016   Anemia due to chronic kidney disease 04/01/2016   GERD (gastroesophageal reflux disease) 04/01/2016   Atypical chest pain    Nephrotic syndrome 10/02/2013  Dyslipidemia 10/02/2013   PAT (paroxysmal atrial tachycardia) (HCC) 09/29/2013   Normocytic anemia 06/29/2013   Glomerulonephritis 01/28/2013   Dyspnea 07/04/2011   Morbid obesity (HCC) 07/04/2011   Essential hypertension 07/04/2011   Non-insulin  dependent type 2 diabetes mellitus (HCC) 07/04/2011   Hypercholesterolemia 07/04/2011    PCP: Jesus Griffes, MD   REFERRING PROVIDER: Jesus Griffes, MD   REFERRING DIAG: [phone] (ICD-10-CM) - Other specified enthesopathies of right lower limb, excluding foot   THERAPY DIAG:  Pain in right leg  Difficulty in walking, not elsewhere classified  Rationale for Evaluation and Treatment: Rehabilitation  ONSET DATE: 01/31/23  SUBJECTIVE:   SUBJECTIVE STATEMENT: Pt reports she is doing better than she was last visit. 1-2/10 pain today in  right leg/thigh. No other issues     EVAL: Pt reports she has chronic R leg pain, more lateral and on the front and back of her R thigh. She experiences burning, swelling and sharp pain. Pt also has minimal low back pain on the R and neuropathy of both feet. 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  PERTINENT HISTORY: Arthritis, CHF, anxiety, ESRD, DM2, neuropathy, high BMI  PAIN:  Are you having pain? Yes: NPRS scale: currently 5/10. Pain range on eval 6-10/10 Pain location: on the lateral ant and post aspects of the R leg Pain description: Burning, sharp, swelling Aggravating factors: Prolonged time on feet, lying on R side Relieving factors: Massager, rest  PRECAUTIONS: None  RED FLAGS: None   WEIGHT BEARING RESTRICTIONS: No  FALLS:  Has patient fallen in last 6 months? No  LIVING ENVIRONMENT: Lives with: lives with their family Lives in: House/apartment Stairs: No Able to access home  OCCUPATION: Disability  PLOF: Independent  PATIENT GOALS: Pain relief  NEXT MD VISIT: 07/1023  OBJECTIVE:  Note: Objective measures were completed at Evaluation unless otherwise noted.  DIAGNOSTIC FINDINGS:   MRI Findings: There is focal fat deposition/small intramuscular lipoma within the proximal right adductor magnus (7:29). Mild fatty atrophy of the visualized right thigh musculature. Insertional gluteus minimus and medius tendinosis. Right common hamstring origin tendinosis. Mild intramuscular edema within the quadratus femoris with narrowed ischiofemoral interval. Tendinosis of the distal obturator externus. The hip and knee are approximated on the large field-of-view images. Degenerative subchondral signal changes with cartilage thinning at the knee. Trace right knee effusion. The neurovascular bundles are normal in course and contour. There is mild subcutaneous edema in the superficial soft tissues of the lateral right hip. No suspicious enhancing soft  tissue or osseous lesion within the right thigh. No rim-enhancing fluid collection. Partially visualized postsurgical changes in the right pelvis.   PATIENT SURVEYS:  LEFS: 31/80= 39% 08/25/23: 58%   COGNITION: Overall cognitive status: Within functional limits for tasks assessed     SENSATION: WFL  EDEMA:  Swelling of the R thigh  MUSCLE LENGTH: Hamstrings: Right limited due to pain deg; Left WNLs deg Debby test: Right WNLs deg; Left WNLs deg  POSTURE: rounded shoulders, forward head, and flexed trunk   PALPATION: Markedly TTP to the Ant/lat R thigh, R greater trochanter area, post. thigh T LOWER EXTREMITY ROM:  Active ROM Right eval Left eval Right 07/28/23 Rt 08/12/23  Hip flexion 70 pain  90+ 100 deg   Hip extension      Hip abduction 25 pain   30  Hip adduction      Hip internal rotation      Hip external rotation 20 pain   40  Knee flexion      Knee extension      Ankle dorsiflexion      Ankle plantarflexion      Ankle inversion      Ankle eversion       (Blank rows = not tested)  LOWER EXTREMITY MMT:  All MMT for the R leg were limited by pain MMT Right eval Left eval Rt. 08/12/23 Lt  08/12/23 Right 08/25/23  Hip flexion 2  3+ 5   Hip extension 2      Hip abduction 2    3  Hip adduction 2      Hip internal rotation 2      Hip external rotation 2    3  Knee flexion 3  4- 5   Knee extension 3  4- 5   Ankle dorsiflexion 3  4 5    Ankle plantarflexion 2      Ankle inversion       Ankle eversion        (Blank rows = not tested)  LOWER EXTREMITY SPECIAL TESTS:  NT  FUNCTIONAL TESTS:  5 times sit to stand: TBA 2 minute walk test: TBA 07/15/23 14.9 sec  5 x STS  with UE 356 feet 2 MWT 08/25/23: see goal section   GAIT: Distance walked: 149ft Assistive device utilized: None Level of assistance: Complete Independence Comments: Antalgic gait pattern                                                                                                                                 TREATMENT DATE:   [facility] Adult PT Treatment:                                                DATE: 08/25/23 Therapeutic Exercise: Hip abduction x 10  Side clam 10 x 2 right  Bridge 10 x 2  SLR 10 x 2  Knee ext 5# bilat 8 x 2  Knee flexion 20# bilat 8 x 2 (reduced to 15# second set) Leg Press 25# bilateral  Rec Bike L1 x 3.5 minutes   Therapeutic Activity:  LEFS MMT    [facility] Adult PT Treatment:                                                DATE: 08/12/23 Therapeutic Exercise: Knee to chest with towel Mini bridge with/without ball x 10 x 2 Quad set  Attempt SLR- unable > 1  Banded march green band  Banded clam unilateral and bilateral green Rt ant hip /quad stretch off table Sidelying stretch for Rt hip and trunk  Manual Therapy: IASTM to Rt ant, lateral  and post thigh/hip  Modalities   10 min Rt ant hip    [facility] Adult PT Treatment:                                                DATE: 08/06/23 Therapeutic Exercise/ Activity Nustep L4 UE/LE x 7 minutes  Suppoerted squats at FM bar x 10 Heel raises Hamstring curl right, standing Gastroc stretch each side  Standing high knee March  Standing hip abduction and extension, 10 x each bilat Supine right hip flexor stretch  Bridge 2 x 10  SAQ 10 x 2 bilat  Ball squeeze 10 x 2  LTR 10 x 2  SLR x 10 right  Side lying right clam 2 x 10  Seated LAQ 10 x 2   Modalities: Cold pack x 15 minutes to right thigh     [facility] Adult PT Treatment:                                                DATE: 08/04/23 Therapeutic Exercise: Nustep L4 UE/LE x 5 minutes  Passive right LE - SKTC, hip abduction, ITB stretch, h/s stretch Hip flexor stretch Mini Bridge 2 x 10  Supine march -small Rom  10 x 2  Supine clam AROM 10 x 2  Right heel slide on slide board x 5  Right quad set 10 x 2  Modalities: Cold pack x 15 minutes to right thigh     [facility] Adult PT Treatment:                                                 DATE: 07/28/23 Therapeutic Exercise: LAQ 2# 10 x 2  Seated hip flexion 10 x 2 2#  STS 5 x 2  T.M. 1.5 mph x 5 minutes  Seated Leg press 45# x 10  Bridge x 10  LTR 10 x 2     PATIENT EDUCATION:  Education details: Eval findings, POC, HEP, self care  Person educated: Patient Education method: Explanation, Demonstration, Tactile cues, Verbal cues, and Handouts Education comprehension: verbalized understanding, returned demonstration, verbal cues required, and tactile cues required  HOME EXERCISE PROGRAM: Access Code: [email or web address] URL: [email or web address] Date: 07/21/2023 Prepared by: Dasie Daft  Exercises - Supine Quad Set  - 1 x daily - 7 x weekly - 1 sets - 10 reps - 5 hold - Supine Heel Slides  - 1 x daily - 7 x weekly - 1 sets - 10 reps - 3 hold - Supine Isometric Hamstring Set  - 1 x daily - 7 x weekly - 1 sets - 10 reps - 5 hold - Supine Hip Abduction with Resistance at Ankles  - 1 x daily - 7 x weekly - 1 sets - 10 reps - 5 hold - Supine Gluteal Sets  - 1 x daily - 7 x weekly - 1 sets - 10 reps - 5 hold - Hooklying Bilateral Isometric Clamshell  - 1 x daily - 7 x weekly - 1 sets - 10 reps - 5 hold - Supine Knee Extension Strengthening  - 1  x daily - 7 x weekly - 1 sets - 10 reps - 5 hold  ASSESSMENT:  CLINICAL IMPRESSION: Patient has improved her strength and LEFS has improved to target, LTG# 5 met. 5 x STS and 2 MWT have both improved. She has several visits left in POC and is planning to join [employer]. Will plan to introduce gym equipment and create a light weight exercise program she can do at the gym.    EVAL :Patient is a 48 y.o. female who was seen today for physical therapy evaluation and treatment for M76.891 (ICD-10-CM) - Other specified enthesopathies of right lower limb, excluding foot. Pt presents to PT with decreased R LE AROM, strength, and function consistent with the referring Dx. Per LEFS, pt's rates the functional use of the R LE as low,  39%. Pt will benefit from skilled PT to address impairments to optimize function with less pain.   OBJECTIVE IMPAIRMENTS: decreased activity tolerance, decreased mobility, difficulty walking, decreased ROM, decreased strength, increased edema, obesity, and pain.   ACTIVITY LIMITATIONS: carrying, lifting, bending, sitting, standing, squatting, sleeping, stairs, transfers, bed mobility, bathing, toileting, dressing, locomotion level, and caring for others  PARTICIPATION LIMITATIONS: meal prep, cleaning, laundry, driving, shopping, and community activity  PERSONAL FACTORS: Fitness, Past/current experiences, Time since onset of injury/illness/exacerbation, and 3+ comorbidities: Arthritis, CHF, anxiety, ESRD, DM2, neuropathy, high BMI are also affecting patient's functional outcome.   REHAB POTENTIAL: Good  CLINICAL DECISION MAKING: Evolving/moderate complexity  EVALUATION COMPLEXITY: Moderate   GOALS:   SHORT TERM GOALS: Target date: 07/30/23 Pt will be Ind in an initial HEP  Baseline: started Goal status: MET   2.  Pt will voice understanding of measures to assist in pain reduction  Baseline: OTC tylenol , ice/heat, elevate leg Goal status: MET   3.  Increase R hip flexion AROM to 90d for improved function and as indication of improved pain Baseline:  Goal status: MET   LONG TERM GOALS: Target date: 09/03/23  Pt will be Ind in a final HEP to maintain achieved LOF  Baseline:  Goal status:ongoing   2.  Increased R hip AROM to flexion 100d, ER 35d, and abd to 40d for improved function with a normalized gait pattern and as indication of improved pain. Baseline: 70, 20, and 25 respectively 08/12/23: 100, 30, 40 Goal status: PARTIALLY MET   3.  Increase R hip strength to 3/5 and knee strength to 4/5 for improved functional use of the R LE with a normalized gait pattern Baseline: see above Goal status: ongoing   4.  Improve 5xSTS by MCID of 5 and by MCID of 5ft as indication  of improved functional mobility  Baseline: TBA 7/10/5: 356 feet and 14.9 sec using UE 08/25/23: 417 feet and 13.4 sec with UE  (26 sec without UE )  Goal status: ongoing   5.  Pt's LEFS score will improve by the MCID to 54% as indication of improved function  Baseline: 39% 08/25/23: 58% Goal status: MET     PLAN:  PT FREQUENCY: 2x/week  PT DURATION: 6 weeks  PLANNED INTERVENTIONS: 97164- PT Re-evaluation, 97110-Therapeutic exercises, 97530- Therapeutic activity, 97112- Neuromuscular re-education, 97535- Self Care, 02859- Manual therapy, [id number]- Gait training, [phone]- Aquatic Therapy, [phone]- Electrical stimulation (unattended), 97016- Vasopneumatic device, 20560 (1-2 muscles), 20561 (3+ muscles)- Dry Needling, Patient/Family education, Balance training, Stair training, Taping, Joint mobilization, Cryotherapy, and Moist heat  PLAN FOR NEXT SESSION: ; try more closed chain, nustep.  Rt LE strength and manual. assess  response to HEP; progress therex as indicated; use of modalities, manual therapy; and TPDN as indicated.   Harlene Persons, PTA 08/25/23 2:09 PM Phone: [phone] Fax: [phone]   Dasie Daft MS, PT 11/16/23 8:28 AM

## 2023-08-27 ENCOUNTER — Encounter: Payer: Self-pay | Admitting: Physical Therapy

## 2023-08-27 ENCOUNTER — Ambulatory Visit: Admitting: Physical Therapy

## 2023-08-27 DIAGNOSIS — M79604 Pain in right leg: Secondary | ICD-10-CM | POA: Diagnosis not present

## 2023-08-27 DIAGNOSIS — R262 Difficulty in walking, not elsewhere classified: Secondary | ICD-10-CM

## 2023-08-27 DIAGNOSIS — R600 Localized edema: Secondary | ICD-10-CM

## 2023-08-27 NOTE — Therapy (Signed)
 [facility] LOWER EXTREMITY TREATMENT    [name] MRN: [id number] DOB:[birth year], 48 y.o., female Today's Date: 08/27/2023  END OF SESSION:  PT End of Session - 08/27/23 1220     Visit Number 11    Number of Visits 13    Date for PT Re-Evaluation 09/03/23    Authorization Type [employer] DUAL COMPLETE    Authorization - Visit Number 11    PT Start Time 1145    PT Stop Time 1223    PT Time Calculation (min) 38 min              Past Medical History:  Diagnosis Date   Acid reflux    Anemia 04/2019   REQUIRING BLOOD TRANSFUSIONS   Anxiety    Arthritis    CHF (congestive heart failure) (HCC)    Dyspnea    with exertion   Dysrhythmia    tachycardia   ESRD (end stage renal disease) (HCC)    [facility]   Facial twitching 11/2020   mouth twitch, eyes roll back, neurology started patient on low dose   Sinemet  IR.   GI bleeding 12/2017   Gout    Headache(784.0)    related to chemo; sometimes weekly (09/12/2013)   Heart murmur    Systolic per Dr Salena Negri' notes   High cholesterol    History of blood transfusion a couple   related to low counts   Hypertension    Iron  deficiency anemia    get epogen shots q month (02/20/2014)   MCGN (minimal change glomerulonephritis)    using chemo to tx;  finished my last tx in 12/2013   Neuropathy    Peritoneal dialysis status (HCC)    Stroke (HCC) 08/15/2017   ruled out that it was not a stroke   Past Surgical History:  Procedure Laterality Date   ABDOMINAL HYSTERECTOMY  2010   laparoscopic   ANKLE FRACTURE SURGERY Right 1994   AV FISTULA PLACEMENT Left 04/14/2016   Procedure: ARTERIOVENOUS (AV) FISTULA CREATION;  Surgeon: Lonni GORMAN Blade, MD;  Location: [facility] OR;  Service: Vascular;  Laterality: Left;   AV FISTULA PLACEMENT Left 08/09/2017   Procedure: INSERTION OF ARTERIOVENOUS (AV) GORE-TEX GRAFT LEFT ARM;  Surgeon: Harvey Carlin BRAVO, MD;   Location: [facility] OR;  Service: Vascular;  Laterality: Left;   AV FISTULA PLACEMENT Right 03/15/2020   Procedure: RIGHT ARM ARTERIOVENOUS (AV) FISTULA CREATION 1st Stage Basilic Transposition;  Surgeon: Serene Gaile ORN, MD;  Location: [facility] OR;  Service: Vascular;  Laterality: Right;   AV FISTULA PLACEMENT Right 02/24/2021   Procedure: RIGHT ARM ARTERIOVENOUS  FISTULA CREATION;  Surgeon: Lanis Fonda BRAVO, MD;  Location: [facility] OR;  Service: Vascular;  Laterality: Right;  regional block   AVGG REMOVAL Left 08/11/2017   Procedure: REMOVAL OF ARTERIOVENOUS GORETEX GRAFT (AVGG);  Surgeon: Serene Gaile ORN, MD;  Location: [facility] OR;  Service: Vascular;  Laterality: Left;   BASCILIC VEIN TRANSPOSITION Right 05/17/2020   Procedure: RIGHT UPPER EXTREMITY SECOND STAGE BASCILIC VEIN TRANSPOSITION;  Surgeon: Serene Gaile ORN, MD;  Location: [facility] OR;  Service: Vascular;  Laterality: Right;   BIOPSY  09/03/2017   Procedure: BIOPSY;  Surgeon: Elicia Claw, MD;  Location: [facility] ENDOSCOPY;  Service: Gastroenterology;;   BIOPSY  12/24/2017   Procedure: BIOPSY;  Surgeon: Wilhelmenia Aloha Raddle., MD;  Location: [facility] ENDOSCOPY;  Service: Gastroenterology;;   CARDIAC CATHETERIZATION  2000's   COLONOSCOPY N/A 10/22/2022   Procedure: COLONOSCOPY;  Surgeon: Maryruth,  Ole DASEN, MD;  Location: [facility] ENDOSCOPY;  Service: Endoscopy;  Laterality: N/A;   COLONOSCOPY WITH PROPOFOL  N/A 09/04/2017   Procedure: COLONOSCOPY WITH PROPOFOL ;  Surgeon: Saintclair Jasper, MD;  Location: [facility] ENDOSCOPY;  Service: Gastroenterology;  Laterality: N/A;   ENTEROSCOPY N/A 12/24/2017   Procedure: ENTEROSCOPY;  Surgeon: Wilhelmenia Aloha Raddle., MD;  Location: [facility] ENDOSCOPY;  Service: Gastroenterology;  Laterality: N/A;   ESOPHAGOGASTRODUODENOSCOPY     ESOPHAGOGASTRODUODENOSCOPY (EGD) WITH PROPOFOL  Left 06/04/2017   Procedure: ESOPHAGOGASTRODUODENOSCOPY (EGD) WITH PROPOFOL ;  Surgeon: Burnette Fallow, MD;  Location: [facility] ENDOSCOPY;  Service: Endoscopy;  Laterality: Left;    ESOPHAGOGASTRODUODENOSCOPY (EGD) WITH PROPOFOL  N/A 09/03/2017   Procedure: ESOPHAGOGASTRODUODENOSCOPY (EGD) WITH PROPOFOL ;  Surgeon: Elicia Claw, MD;  Location: [facility] ENDOSCOPY;  Service: Gastroenterology;  Laterality: N/A;   ESOPHAGOGASTRODUODENOSCOPY (EGD) WITH PROPOFOL  N/A 04/27/2022   Procedure: ESOPHAGOGASTRODUODENOSCOPY (EGD) WITH PROPOFOL ;  Surgeon: Maryruth Ole DASEN, MD;  Location: [facility] ENDOSCOPY;  Service: Endoscopy;  Laterality: N/A;   FISTULA SUPERFICIALIZATION Left 04/02/2017   Procedure: FISTULA SUPERFICIALIZATION LEFT ARM;  Surgeon: Serene Gaile ORN, MD;  Location: [facility] OR;  Service: Vascular;  Laterality: Left;   FISTULA SUPERFICIALIZATION Right 04/14/2021   Procedure: RIGHT ARM FISTULA SUPERFICIALIZATION;  Surgeon: Lanis Fonda BRAVO, MD;  Location: [facility] OR;  Service: Vascular;  Laterality: Right;  PERIPHERAL NERVE BLOCK   FISTULOGRAM Left 04/07/2016   Procedure: FISTULOGRAM;  Surgeon: Penne Lonni Colorado, MD;  Location: [facility] OR;  Service: Vascular;  Laterality: Left;   FRACTURE SURGERY     right ankle   GIVENS CAPSULE STUDY N/A 10/29/2017   Procedure: GIVENS CAPSULE STUDY;  Surgeon: Dianna Specking, MD;  Location: [facility] ENDOSCOPY;  Service: Endoscopy;  Laterality: N/A;   GIVENS CAPSULE STUDY N/A 12/24/2017   Procedure: GIVENS CAPSULE STUDY;  Surgeon: Wilhelmenia Aloha Raddle., MD;  Location: [facility] ENDOSCOPY;  Service: Gastroenterology;  Laterality: N/A;   GIVENS CAPSULE STUDY N/A 05/22/2018   Procedure: GIVENS CAPSULE STUDY;  Surgeon: Wilhelmenia Aloha Raddle., MD;  Location: [facility] ENDOSCOPY;  Service: Gastroenterology;  Laterality: N/A;   INSERTION OF DIALYSIS CATHETER Right 04/07/2016   Procedure: INSERTION OF DIALYSIS CATHETER;  Surgeon: Penne Lonni Colorado, MD;  Location: [facility] OR;  Service: Vascular;  Laterality: Right;   INSERTION OF DIALYSIS CATHETER Right 04/04/2017   Procedure: INSERTION OF DIALYSIS CATHETER;  Surgeon: Colorado Penne Lonni, MD;  Location: [facility] OR;  Service: Vascular;   Laterality: Right;   IR FLUORO GUIDE CV LINE LEFT  03/13/2020   IR US  GUIDE VASC ACCESS LEFT  03/13/2020   LEFT HEART CATH AND CORONARY ANGIOGRAPHY N/A 05/12/2019   Procedure: LEFT HEART CATH AND CORONARY ANGIOGRAPHY;  Surgeon: Claudene Pacific, MD;  [facility] INVASIVE CV LAB;  Service: Cardiovascular;  Laterality: N/A;   LIGATION OF ARTERIOVENOUS  FISTULA Left 04/14/2016   Procedure: LIGATION OF ARTERIOVENOUS  FISTULA;  Surgeon: Lonni GORMAN Blade, MD;  Location: [facility] OR;  Service: Vascular;  Laterality: Left;   LIGATION OF COMPETING BRANCHES OF ARTERIOVENOUS FISTULA Right 05/17/2020   Procedure: LIGATION OF COMPETING BRANCHES OF RIGHT ARM ARTERIOVENOUS FISTULA;  Surgeon: Serene Gaile ORN, MD;  Location: [facility] OR;  Service: Vascular;  Laterality: Right;   PATCH ANGIOPLASTY Left 06/19/2016   Procedure: PATCH ANGIOPLASTY LEFT ARTERIOVENOUS FISTULA;  Surgeon: Blade Lonni GORMAN, MD;  Location: [facility] OR;  Service: Vascular;  Laterality: Left;   POLYPECTOMY  09/04/2017   Procedure: POLYPECTOMY;  Surgeon: Saintclair Jasper, MD;  Location: [facility] ENDOSCOPY;  Service: Gastroenterology;;   REVISON OF ARTERIOVENOUS FISTULA Left 06/19/2016   Procedure: REVISION  SUPERFICIALIZATION OF BRACHIOCEPHALIC ARTERIOVENOUS FISTULA;  Surgeon: Eliza Lonni RAMAN, MD;  Location: [facility] OR;  Service: Vascular;  Laterality: Left;   UPPER EXTREMITY VENOGRAPHY Bilateral 02/19/2021   Procedure: UPPER EXTREMITY VENOGRAPHY;  Surgeon: Lanis Fonda BRAVO, MD;  Location: [facility] INVASIVE CV LAB;  Service: Cardiovascular;  Laterality: Bilateral;   Patient Active Problem List   Diagnosis Date Noted   Gastroparesis 12/31/2022   OSA on CPAP 12/31/2022   Rectal bleeding 10/23/2022   Acute on chronic anemia 10/19/2022   Obesity (BMI 30-39.9) 08/25/2022   Acute encephalopathy 08/24/2022   (HFpEF) heart failure with preserved ejection fraction (HCC) 08/24/2022   Acute respiratory distress 08/11/2022   Pneumonia due to COVID-19 virus 08/10/2022   Facial  twitching 08/09/2022   CAP (community acquired pneumonia) 07/21/2022   Acute pulmonary edema (HCC) 07/20/2022   Hypervolemia associated with renal insufficiency 05/11/2022   Hyperkalemia 05/11/2022   Restless leg syndrome 04/23/2022   Anaphylactic shock, unspecified, initial encounter 09/07/2021   Gout 08/21/2021   GERD without esophagitis 08/21/2021   Anxiety and depression 08/21/2021   Neuropathic pain 05/26/2021   Dystonia 01/13/2021   Polyneuropathy associated with underlying disease (HCC) 01/13/2021   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   Other specified complication of vascular prosthetic devices, implants and grafts, initial encounter (HCC) 09/03/2020   Contact with and (suspected) exposure to covid-19 08/13/2020   Altered mental status, unspecified 06/25/2020   Other irregular eye movements 06/25/2020   PD catheter dysfunction (HCC) 04/26/2020   Other disorders of phosphorus metabolism 04/17/2020   Hypercalcemia 04/15/2020   Impacted cerumen of both ears 04/05/2020   Chronic gouty arthritis 04/05/2020   GAD (generalized anxiety disorder) 04/05/2020   History of hypertension 04/05/2020   Peripheral neuropathy 04/05/2020   Primary insomnia 04/05/2020   Malnutrition of moderate degree 03/13/2020   Hypertensive urgency 03/11/2020   Myoclonia 03/11/2020   Abscess of vulva 02/19/2020   Pericolonic abscess 02/06/2020   Chest pain 05/10/2019   Anemia 04/25/2019   SVT (supraventricular tachycardia) (HCC) 04/25/2019   Elevated troponin 04/25/2019   ESRD on dialysis (HCC) 04/25/2019   Bacterial infection, unspecified 04/17/2019   Pain, unspecified 04/17/2019   Pruritus, unspecified 04/17/2019   Benign paroxysmal positional vertigo of left ear 08/17/2018   Acute on chronic blood loss anemia 05/21/2018   Lower GI bleed 05/20/2018   Symptomatic anemia 10/28/2017   Acidosis 05/24/2017   Chills (without fever) 05/24/2017   Coagulation defect, unspecified (HCC) 05/24/2017   Dependence on  renal dialysis (HCC) 05/24/2017   Encounter for fitting and adjustment of extracorporeal dialysis catheter (HCC) 05/24/2017   Fluid overload, unspecified 05/24/2017   Iron  deficiency anemia, unspecified 05/24/2017   Liver disease, unspecified 05/24/2017   Other disorders of electrolyte and fluid balance, not elsewhere classified 05/24/2017   Other disorders resulting from impaired renal tubular function 05/24/2017   Other long term (current) drug therapy 05/24/2017   Secondary hyperparathyroidism of renal origin (HCC) 05/24/2017   Unspecified abnormal findings in urine 05/24/2017   Unspecified jaundice 05/24/2017   Chronic kidney disease 05/24/2017   Type 2 diabetes mellitus with other diabetic kidney complication (HCC) 05/24/2017   ESRD on peritoneal dialysis (HCC) 04/01/2016   Anemia due to chronic kidney disease 04/01/2016   GERD (gastroesophageal reflux disease) 04/01/2016   Atypical chest pain    Nephrotic syndrome 10/02/2013   Dyslipidemia 10/02/2013   PAT (paroxysmal atrial tachycardia) (HCC) 09/29/2013   Normocytic anemia 06/29/2013   Glomerulonephritis 01/28/2013   Dyspnea 07/04/2011   Morbid obesity (HCC) 07/04/2011  Essential hypertension 07/04/2011   Non-insulin  dependent type 2 diabetes mellitus (HCC) 07/04/2011   Hypercholesterolemia 07/04/2011    PCP: Jesus Griffes, MD   REFERRING PROVIDER: Jesus Griffes, MD   REFERRING DIAG: [phone] (ICD-10-CM) - Other specified enthesopathies of right lower limb, excluding foot   THERAPY DIAG:  Pain in right leg  Difficulty in walking, not elsewhere classified  Localized edema  Rationale for Evaluation and Treatment: Rehabilitation  ONSET DATE: 01/31/23  SUBJECTIVE:   SUBJECTIVE STATEMENT: Pt reports soreness after last session, okay today. Soreness in only in the right upper leg.     EVAL: Pt reports she has chronic R leg pain, more lateral and on the front and back of her R thigh. She experiences  burning, swelling and sharp pain. Pt also has minimal low back pain on the R and neuropathy of both feet. 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  PERTINENT HISTORY: Arthritis, CHF, anxiety, ESRD, DM2, neuropathy, high BMI  PAIN:  Are you having pain? Yes: NPRS scale: currently 5/10. Pain range on eval 6-10/10 Pain location: on the lateral ant and post aspects of the R leg Pain description: Burning, sharp, swelling Aggravating factors: Prolonged time on feet, lying on R side Relieving factors: Massager, rest  PRECAUTIONS: None  RED FLAGS: None   WEIGHT BEARING RESTRICTIONS: No  FALLS:  Has patient fallen in last 6 months? No  LIVING ENVIRONMENT: Lives with: lives with their family Lives in: House/apartment Stairs: No Able to access home  OCCUPATION: Disability  PLOF: Independent  PATIENT GOALS: Pain relief  NEXT MD VISIT: 07/1023  OBJECTIVE:  Note: Objective measures were completed at Evaluation unless otherwise noted.  DIAGNOSTIC FINDINGS:   MRI Findings: There is focal fat deposition/small intramuscular lipoma within the proximal right adductor magnus (7:29). Mild fatty atrophy of the visualized right thigh musculature. Insertional gluteus minimus and medius tendinosis. Right common hamstring origin tendinosis. Mild intramuscular edema within the quadratus femoris with narrowed ischiofemoral interval. Tendinosis of the distal obturator externus. The hip and knee are approximated on the large field-of-view images. Degenerative subchondral signal changes with cartilage thinning at the knee. Trace right knee effusion. The neurovascular bundles are normal in course and contour. There is mild subcutaneous edema in the superficial soft tissues of the lateral right hip. No suspicious enhancing soft tissue or osseous lesion within the right thigh. No rim-enhancing fluid collection. Partially visualized postsurgical changes in the right pelvis.   PATIENT  SURVEYS:  LEFS: 31/80= 39% 08/25/23: 58%   COGNITION: Overall cognitive status: Within functional limits for tasks assessed     SENSATION: WFL  EDEMA:  Swelling of the R thigh  MUSCLE LENGTH: Hamstrings: Right limited due to pain deg; Left WNLs deg Debby test: Right WNLs deg; Left WNLs deg  POSTURE: rounded shoulders, forward head, and flexed trunk   PALPATION: Markedly TTP to the Ant/lat R thigh, R greater trochanter area, post. thigh T LOWER EXTREMITY ROM:  Active ROM Right eval Left eval Right 07/28/23 Rt 08/12/23  Hip flexion 70 pain  90+ 100 deg   Hip extension      Hip abduction 25 pain   30  Hip adduction      Hip internal rotation      Hip external rotation 20 pain   40  Knee flexion      Knee extension      Ankle dorsiflexion      Ankle plantarflexion      Ankle inversion  Ankle eversion       (Blank rows = not tested)  LOWER EXTREMITY MMT:  All MMT for the R leg were limited by pain MMT Right eval Left eval Rt. 08/12/23 Lt  08/12/23 Right 08/25/23  Hip flexion 2  3+ 5   Hip extension 2      Hip abduction 2    3  Hip adduction 2      Hip internal rotation 2      Hip external rotation 2    3  Knee flexion 3  4- 5   Knee extension 3  4- 5   Ankle dorsiflexion 3  4 5    Ankle plantarflexion 2      Ankle inversion       Ankle eversion        (Blank rows = not tested)  LOWER EXTREMITY SPECIAL TESTS:  NT  FUNCTIONAL TESTS:  5 times sit to stand: TBA 2 minute walk test: TBA 07/15/23 14.9 sec  5 x STS  with UE 356 feet 2 MWT 08/25/23: see goal section   GAIT: Distance walked: 16ft Assistive device utilized: None Level of assistance: Complete Independence Comments: Antalgic gait pattern                                                                                                                                TREATMENT DATE:    [facility] Adult PT Treatment:                                                DATE: 08/27/23 Therapeutic  Exercise: Rec Bike 2 x 5 minutes  Knee ext 5# bilat 10 x 2  Knee flexion 15# bilat 10 x 2 Leg Press 25# bilateral 10 x 2  Hip abduction x 12 left, right unable to lift  Side clam left and  right 10 x 2 right and left  Bridge 10 x 2  SLR 10 x 2 each Standing hip abduction   [facility] Adult PT Treatment:                                                DATE: 08/25/23 Therapeutic Exercise: Hip abduction x 10  Side clam 10 x 2 right  Bridge 10 x 2  SLR 10 x 2  Knee ext 5# bilat 8 x 2  Knee flexion 20# bilat 8 x 2 (reduced to 15# second set) Leg Press 25# bilateral  Rec Bike L1 x 3.5 minutes   Therapeutic Activity:  LEFS MMT    [facility] Adult PT Treatment:  DATE: 08/12/23 Therapeutic Exercise: Knee to chest with towel Mini bridge with/without ball x 10 x 2 Quad set  Attempt SLR- unable > 1  Banded march green band  Banded clam unilateral and bilateral green Rt ant hip /quad stretch off table Sidelying stretch for Rt hip and trunk  Manual Therapy: IASTM to Rt ant, lateral and post thigh/hip  Modalities   10 min Rt ant hip    [facility] Adult PT Treatment:                                                DATE: 08/06/23 Therapeutic Exercise/ Activity Nustep L4 UE/LE x 7 minutes  Suppoerted squats at FM bar x 10 Heel raises Hamstring curl right, standing Gastroc stretch each side  Standing high knee March  Standing hip abduction and extension, 10 x each bilat Supine right hip flexor stretch  Bridge 2 x 10  SAQ 10 x 2 bilat  Ball squeeze 10 x 2  LTR 10 x 2  SLR x 10 right  Side lying right clam 2 x 10  Seated LAQ 10 x 2   Modalities: Cold pack x 15 minutes to right thigh     [facility] Adult PT Treatment:                                                DATE: 08/04/23 Therapeutic Exercise: Nustep L4 UE/LE x 5 minutes  Passive right LE - SKTC, hip abduction, ITB stretch, h/s stretch Hip flexor stretch Mini Bridge 2 x 10  Supine march  -small Rom  10 x 2  Supine clam AROM 10 x 2  Right heel slide on slide board x 5  Right quad set 10 x 2  Modalities: Cold pack x 15 minutes to right thigh     [facility] Adult PT Treatment:                                                DATE: 07/28/23 Therapeutic Exercise: LAQ 2# 10 x 2  Seated hip flexion 10 x 2 2#  STS 5 x 2  T.M. 1.5 mph x 5 minutes  Seated Leg press 45# x 10  Bridge x 10  LTR 10 x 2     PATIENT EDUCATION:  Education details: Eval findings, POC, HEP, self care  Person educated: Patient Education method: Explanation, Demonstration, Tactile cues, Verbal cues, and Handouts Education comprehension: verbalized understanding, returned demonstration, verbal cues required, and tactile cues required  HOME EXERCISE PROGRAM: Access Code: [email or web address] URL: [email or web address] Date: 07/21/2023 Prepared by: Dasie Daft  Exercises - Supine Quad Set  - 1 x daily - 7 x weekly - 1 sets - 10 reps - 5 hold - Supine Heel Slides  - 1 x daily - 7 x weekly - 1 sets - 10 reps - 3 hold - Supine Isometric Hamstring Set  - 1 x daily - 7 x weekly - 1 sets - 10 reps - 5 hold - Supine Hip Abduction with Resistance at Ankles  - 1 x daily - 7 x weekly - 1 sets -  10 reps - 5 hold - Supine Gluteal Sets  - 1 x daily - 7 x weekly - 1 sets - 10 reps - 5 hold - Hooklying Bilateral Isometric Clamshell  - 1 x daily - 7 x weekly - 1 sets - 10 reps - 5 hold - Supine Knee Extension Strengthening  - 1 x daily - 7 x weekly - 1 sets - 10 reps - 5 hold Added - Standing Hip Abduction with Counter Support  - 1 x daily - 7 x weekly - 2 sets - 10 reps  ASSESSMENT:  CLINICAL IMPRESSION:  She has several visits left in POC and is planning to join [employer]. Will continued gym equipment and create a light weight exercise program she can do at the gym. Hip abduction in standing was added to HEP today. She was unable to lift in side lying today. She tolerated increased reps on gym equipment  today.    EVAL :Patient is a 48 y.o. female who was seen today for physical therapy evaluation and treatment for M76.891 (ICD-10-CM) - Other specified enthesopathies of right lower limb, excluding foot. Pt presents to PT with decreased R LE AROM, strength, and function consistent with the referring Dx. Per LEFS, pt's rates the functional use of the R LE as low, 39%. Pt will benefit from skilled PT to address impairments to optimize function with less pain.   OBJECTIVE IMPAIRMENTS: decreased activity tolerance, decreased mobility, difficulty walking, decreased ROM, decreased strength, increased edema, obesity, and pain.   ACTIVITY LIMITATIONS: carrying, lifting, bending, sitting, standing, squatting, sleeping, stairs, transfers, bed mobility, bathing, toileting, dressing, locomotion level, and caring for others  PARTICIPATION LIMITATIONS: meal prep, cleaning, laundry, driving, shopping, and community activity  PERSONAL FACTORS: Fitness, Past/current experiences, Time since onset of injury/illness/exacerbation, and 3+ comorbidities: Arthritis, CHF, anxiety, ESRD, DM2, neuropathy, high BMI are also affecting patient's functional outcome.   REHAB POTENTIAL: Good  CLINICAL DECISION MAKING: Evolving/moderate complexity  EVALUATION COMPLEXITY: Moderate   GOALS:   SHORT TERM GOALS: Target date: 07/30/23 Pt will be Ind in an initial HEP  Baseline: started Goal status: MET   2.  Pt will voice understanding of measures to assist in pain reduction  Baseline: OTC tylenol , ice/heat, elevate leg Goal status: MET   3.  Increase R hip flexion AROM to 90d for improved function and as indication of improved pain Baseline:  Goal status: MET   LONG TERM GOALS: Target date: 09/03/23  Pt will be Ind in a final HEP to maintain achieved LOF  Baseline:  Goal status:ongoing   2.  Increased R hip AROM to flexion 100d, ER 35d, and abd to 40d for improved function with a normalized gait pattern and as  indication of improved pain. Baseline: 70, 20, and 25 respectively 08/12/23: 100, 30, 40 Goal status: PARTIALLY MET   3.  Increase R hip strength to 3/5 and knee strength to 4/5 for improved functional use of the R LE with a normalized gait pattern Baseline: see above Goal status: ongoing   4.  Improve 5xSTS by MCID of 5 and by MCID of 59ft as indication of improved functional mobility  Baseline: TBA 7/10/5: 356 feet and 14.9 sec using UE 08/25/23: 417 feet and 13.4 sec with UE  (26 sec without UE )  Goal status: ongoing   5.  Pt's LEFS score will improve by the MCID to 54% as indication of improved function  Baseline: 39% 08/25/23: 58% Goal status: MET  PLAN:  PT FREQUENCY: 2x/week  PT DURATION: 6 weeks  PLANNED INTERVENTIONS: [id number]- PT Re-evaluation, 97110-Therapeutic exercises, 97530- Therapeutic activity, 97112- Neuromuscular re-education, 97535- Self Care, [id number]- Manual therapy, [id number]- Gait training, [phone]- Aquatic Therapy, [phone]- Electrical stimulation (unattended), 97016- Vasopneumatic device, 20560 (1-2 muscles), 20561 (3+ muscles)- Dry Needling, Patient/Family education, Balance training, Stair training, Taping, Joint mobilization, Cryotherapy, and Moist heat  PLAN FOR NEXT SESSION: ; try more closed chain, nustep.  Rt LE strength and manual. assess response to HEP; progress therex as indicated; use of modalities, manual therapy; and TPDN as indicated.   Harlene Persons, PTA 08/27/23 12:23 PM Phone: [phone] Fax: [phone]

## 2023-08-30 NOTE — Therapy (Unsigned)
 [facility] LOWER EXTREMITY TREATMENT    [name] MRN: [id number] DOB:Nov 21, 1975, 48 y.o., female Today's Date: 08/31/2023  END OF SESSION:  PT End of Session - 08/31/23 1108     Visit Number 12    Number of Visits 13    Date for PT Re-Evaluation 09/03/23    Authorization Type [employer] DUAL COMPLETE    Authorization - Visit Number 12    Authorization - Number of Visits 27    PT Start Time 1105    PT Stop Time 1145    PT Time Calculation (min) 40 min    Activity Tolerance Patient tolerated treatment well    Behavior During Therapy WFL for tasks assessed/performed               Past Medical History:  Diagnosis Date   Acid reflux    Anemia 04/2019   REQUIRING BLOOD TRANSFUSIONS   Anxiety    Arthritis    CHF (congestive heart failure) (HCC)    Dyspnea    with exertion   Dysrhythmia    tachycardia   ESRD (end stage renal disease) (HCC)    [facility]   Facial twitching 11/2020   mouth twitch, eyes roll back, neurology started patient on low dose   Sinemet  IR.   GI bleeding 12/2017   Gout    Headache(784.0)    related to chemo; sometimes weekly (09/12/2013)   Heart murmur    Systolic per Dr Salena Negri' notes   High cholesterol    History of blood transfusion a couple   related to low counts   Hypertension    Iron  deficiency anemia    get epogen shots q month (02/20/2014)   MCGN (minimal change glomerulonephritis)    using chemo to tx;  finished my last tx in 12/2013   Neuropathy    Peritoneal dialysis status (HCC)    Stroke (HCC) 08/15/2017   ruled out that it was not a stroke   Past Surgical History:  Procedure Laterality Date   ABDOMINAL HYSTERECTOMY  2010   laparoscopic   ANKLE FRACTURE SURGERY Right 1994   AV FISTULA PLACEMENT Left 04/14/2016   Procedure: ARTERIOVENOUS (AV) FISTULA CREATION;  Surgeon: Lonni GORMAN Blade, MD;  Location: [facility] OR;  Service: Vascular;   Laterality: Left;   AV FISTULA PLACEMENT Left 08/09/2017   Procedure: INSERTION OF ARTERIOVENOUS (AV) GORE-TEX GRAFT LEFT ARM;  Surgeon: Harvey Carlin BRAVO, MD;  Location: [facility] OR;  Service: Vascular;  Laterality: Left;   AV FISTULA PLACEMENT Right 03/15/2020   Procedure: RIGHT ARM ARTERIOVENOUS (AV) FISTULA CREATION 1st Stage Basilic Transposition;  Surgeon: Serene Gaile ORN, MD;  Location: [facility] OR;  Service: Vascular;  Laterality: Right;   AV FISTULA PLACEMENT Right 02/24/2021   Procedure: RIGHT ARM ARTERIOVENOUS  FISTULA CREATION;  Surgeon: Lanis Fonda BRAVO, MD;  Location: [facility] OR;  Service: Vascular;  Laterality: Right;  regional block   AVGG REMOVAL Left 08/11/2017   Procedure: REMOVAL OF ARTERIOVENOUS GORETEX GRAFT (AVGG);  Surgeon: Serene Gaile ORN, MD;  Location: [facility] OR;  Service: Vascular;  Laterality: Left;   BASCILIC VEIN TRANSPOSITION Right 05/17/2020   Procedure: RIGHT UPPER EXTREMITY SECOND STAGE BASCILIC VEIN TRANSPOSITION;  Surgeon: Serene Gaile ORN, MD;  Location: [facility] OR;  Service: Vascular;  Laterality: Right;   BIOPSY  09/03/2017   Procedure: BIOPSY;  Surgeon: Elicia Claw, MD;  Location: [facility] ENDOSCOPY;  Service: Gastroenterology;;   BIOPSY  12/24/2017   Procedure: BIOPSY;  Surgeon:  Mansouraty, Aloha Raddle., MD;  Location: [facility] ENDOSCOPY;  Service: Gastroenterology;;   CARDIAC CATHETERIZATION  2000's   COLONOSCOPY N/A 10/22/2022   Procedure: COLONOSCOPY;  Surgeon: Maryruth Ole DASEN, MD;  Location: [facility] ENDOSCOPY;  Service: Endoscopy;  Laterality: N/A;   COLONOSCOPY WITH PROPOFOL  N/A 09/04/2017   Procedure: COLONOSCOPY WITH PROPOFOL ;  Surgeon: Saintclair Jasper, MD;  Location: [facility] ENDOSCOPY;  Service: Gastroenterology;  Laterality: N/A;   ENTEROSCOPY N/A 12/24/2017   Procedure: ENTEROSCOPY;  Surgeon: Wilhelmenia Aloha Raddle., MD;  Location: [facility] ENDOSCOPY;  Service: Gastroenterology;  Laterality: N/A;   ESOPHAGOGASTRODUODENOSCOPY     ESOPHAGOGASTRODUODENOSCOPY (EGD) WITH PROPOFOL  Left 06/04/2017    Procedure: ESOPHAGOGASTRODUODENOSCOPY (EGD) WITH PROPOFOL ;  Surgeon: Burnette Fallow, MD;  Location: [facility] ENDOSCOPY;  Service: Endoscopy;  Laterality: Left;   ESOPHAGOGASTRODUODENOSCOPY (EGD) WITH PROPOFOL  N/A 09/03/2017   Procedure: ESOPHAGOGASTRODUODENOSCOPY (EGD) WITH PROPOFOL ;  Surgeon: Elicia Claw, MD;  Location: [facility] ENDOSCOPY;  Service: Gastroenterology;  Laterality: N/A;   ESOPHAGOGASTRODUODENOSCOPY (EGD) WITH PROPOFOL  N/A 04/27/2022   Procedure: ESOPHAGOGASTRODUODENOSCOPY (EGD) WITH PROPOFOL ;  Surgeon: Maryruth Ole DASEN, MD;  Location: [facility] ENDOSCOPY;  Service: Endoscopy;  Laterality: N/A;   FISTULA SUPERFICIALIZATION Left 04/02/2017   Procedure: FISTULA SUPERFICIALIZATION LEFT ARM;  Surgeon: Serene Gaile ORN, MD;  Location: [facility] OR;  Service: Vascular;  Laterality: Left;   FISTULA SUPERFICIALIZATION Right 04/14/2021   Procedure: RIGHT ARM FISTULA SUPERFICIALIZATION;  Surgeon: Lanis Fonda BRAVO, MD;  Location: [facility] OR;  Service: Vascular;  Laterality: Right;  PERIPHERAL NERVE BLOCK   FISTULOGRAM Left 04/07/2016   Procedure: FISTULOGRAM;  Surgeon: Penne Lonni Colorado, MD;  Location: [facility] OR;  Service: Vascular;  Laterality: Left;   FRACTURE SURGERY     right ankle   GIVENS CAPSULE STUDY N/A 10/29/2017   Procedure: GIVENS CAPSULE STUDY;  Surgeon: Dianna Specking, MD;  Location: [facility] ENDOSCOPY;  Service: Endoscopy;  Laterality: N/A;   GIVENS CAPSULE STUDY N/A 12/24/2017   Procedure: GIVENS CAPSULE STUDY;  Surgeon: Wilhelmenia Aloha Raddle., MD;  Location: [facility] ENDOSCOPY;  Service: Gastroenterology;  Laterality: N/A;   GIVENS CAPSULE STUDY N/A 05/22/2018   Procedure: GIVENS CAPSULE STUDY;  Surgeon: Wilhelmenia Aloha Raddle., MD;  Location: [facility] ENDOSCOPY;  Service: Gastroenterology;  Laterality: N/A;   INSERTION OF DIALYSIS CATHETER Right 04/07/2016   Procedure: INSERTION OF DIALYSIS CATHETER;  Surgeon: Penne Lonni Colorado, MD;  Location: [facility] OR;  Service: Vascular;  Laterality: Right;   INSERTION OF  DIALYSIS CATHETER Right 04/04/2017   Procedure: INSERTION OF DIALYSIS CATHETER;  Surgeon: Colorado Penne Lonni, MD;  Location: [facility] OR;  Service: Vascular;  Laterality: Right;   IR FLUORO GUIDE CV LINE LEFT  03/13/2020   IR US  GUIDE VASC ACCESS LEFT  03/13/2020   LEFT HEART CATH AND CORONARY ANGIOGRAPHY N/A 05/12/2019   Procedure: LEFT HEART CATH AND CORONARY ANGIOGRAPHY;  Surgeon: Claudene Pacific, MD;  [facility] INVASIVE CV LAB;  Service: Cardiovascular;  Laterality: N/A;   LIGATION OF ARTERIOVENOUS  FISTULA Left 04/14/2016   Procedure: LIGATION OF ARTERIOVENOUS  FISTULA;  Surgeon: Lonni GORMAN Blade, MD;  Location: [facility] OR;  Service: Vascular;  Laterality: Left;   LIGATION OF COMPETING BRANCHES OF ARTERIOVENOUS FISTULA Right 05/17/2020   Procedure: LIGATION OF COMPETING BRANCHES OF RIGHT ARM ARTERIOVENOUS FISTULA;  Surgeon: Serene Gaile ORN, MD;  Location: [facility] OR;  Service: Vascular;  Laterality: Right;   PATCH ANGIOPLASTY Left 06/19/2016   Procedure: PATCH ANGIOPLASTY LEFT ARTERIOVENOUS FISTULA;  Surgeon: Blade Lonni GORMAN, MD;  Location: [facility] OR;  Service: Vascular;  Laterality: Left;   POLYPECTOMY  09/04/2017   Procedure: POLYPECTOMY;  Surgeon: Saintclair Jasper, MD;  Location: [facility] ENDOSCOPY;  Service: Gastroenterology;;   REVISON OF ARTERIOVENOUS FISTULA Left 06/19/2016   Procedure: REVISION SUPERFICIALIZATION OF BRACHIOCEPHALIC ARTERIOVENOUS FISTULA;  Surgeon: Eliza Lonni RAMAN, MD;  Location: [facility] OR;  Service: Vascular;  Laterality: Left;   UPPER EXTREMITY VENOGRAPHY Bilateral 02/19/2021   Procedure: UPPER EXTREMITY VENOGRAPHY;  Surgeon: Lanis Fonda BRAVO, MD;  Location: [facility] INVASIVE CV LAB;  Service: Cardiovascular;  Laterality: Bilateral;   Patient Active Problem List   Diagnosis Date Noted   Gastroparesis 12/31/2022   OSA on CPAP 12/31/2022   Rectal bleeding 10/23/2022   Acute on chronic anemia 10/19/2022   Obesity (BMI 30-39.9) 08/25/2022   Acute encephalopathy 08/24/2022   (HFpEF) heart  failure with preserved ejection fraction (HCC) 08/24/2022   Acute respiratory distress 08/11/2022   Pneumonia due to COVID-19 virus 08/10/2022   Facial twitching 08/09/2022   CAP (community acquired pneumonia) 07/21/2022   Acute pulmonary edema (HCC) 07/20/2022   Hypervolemia associated with renal insufficiency 05/11/2022   Hyperkalemia 05/11/2022   Restless leg syndrome 04/23/2022   Anaphylactic shock, unspecified, initial encounter 09/07/2021   Gout 08/21/2021   GERD without esophagitis 08/21/2021   Anxiety and depression 08/21/2021   Neuropathic pain 05/26/2021   Dystonia 01/13/2021   Polyneuropathy associated with underlying disease (HCC) 01/13/2021   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   Other specified complication of vascular prosthetic devices, implants and grafts, initial encounter (HCC) 09/03/2020   Contact with and (suspected) exposure to covid-19 08/13/2020   Altered mental status, unspecified 06/25/2020   Other irregular eye movements 06/25/2020   PD catheter dysfunction (HCC) 04/26/2020   Other disorders of phosphorus metabolism 04/17/2020   Hypercalcemia 04/15/2020   Impacted cerumen of both ears 04/05/2020   Chronic gouty arthritis 04/05/2020   GAD (generalized anxiety disorder) 04/05/2020   History of hypertension 04/05/2020   Peripheral neuropathy 04/05/2020   Primary insomnia 04/05/2020   Malnutrition of moderate degree 03/13/2020   Hypertensive urgency 03/11/2020   Myoclonia 03/11/2020   Abscess of vulva 02/19/2020   Pericolonic abscess 02/06/2020   Chest pain 05/10/2019   Anemia 04/25/2019   SVT (supraventricular tachycardia) (HCC) 04/25/2019   Elevated troponin 04/25/2019   ESRD on dialysis (HCC) 04/25/2019   Bacterial infection, unspecified 04/17/2019   Pain, unspecified 04/17/2019   Pruritus, unspecified 04/17/2019   Benign paroxysmal positional vertigo of left ear 08/17/2018   Acute on chronic blood loss anemia 05/21/2018   Lower GI bleed 05/20/2018    Symptomatic anemia 10/28/2017   Acidosis 05/24/2017   Chills (without fever) 05/24/2017   Coagulation defect, unspecified (HCC) 05/24/2017   Dependence on renal dialysis (HCC) 05/24/2017   Encounter for fitting and adjustment of extracorporeal dialysis catheter (HCC) 05/24/2017   Fluid overload, unspecified 05/24/2017   Iron  deficiency anemia, unspecified 05/24/2017   Liver disease, unspecified 05/24/2017   Other disorders of electrolyte and fluid balance, not elsewhere classified 05/24/2017   Other disorders resulting from impaired renal tubular function 05/24/2017   Other long term (current) drug therapy 05/24/2017   Secondary hyperparathyroidism of renal origin (HCC) 05/24/2017   Unspecified abnormal findings in urine 05/24/2017   Unspecified jaundice 05/24/2017   Chronic kidney disease 05/24/2017   Type 2 diabetes mellitus with other diabetic kidney complication (HCC) 05/24/2017   ESRD on peritoneal dialysis (HCC) 04/01/2016   Anemia due to chronic kidney disease 04/01/2016   GERD (gastroesophageal reflux disease) 04/01/2016   Atypical chest pain    Nephrotic syndrome 10/02/2013  Dyslipidemia 10/02/2013   PAT (paroxysmal atrial tachycardia) (HCC) 09/29/2013   Normocytic anemia 06/29/2013   Glomerulonephritis 01/28/2013   Dyspnea 07/04/2011   Morbid obesity (HCC) 07/04/2011   Essential hypertension 07/04/2011   Non-insulin  dependent type 2 diabetes mellitus (HCC) 07/04/2011   Hypercholesterolemia 07/04/2011    PCP: Jesus Griffes, MD   REFERRING PROVIDER: Jesus Griffes, MD   REFERRING DIAG: [phone] (ICD-10-CM) - Other specified enthesopathies of right lower limb, excluding foot   THERAPY DIAG:  No diagnosis found.  Rationale for Evaluation and Treatment: Rehabilitation  ONSET DATE: 01/31/23  SUBJECTIVE:   SUBJECTIVE STATEMENT: Pt reports soreness after last session, okay today. Soreness in only in the right upper leg.     EVAL: Pt reports she has  chronic R leg pain, more lateral and on the front and back of her R thigh. She experiences burning, swelling and sharp pain. Pt also has minimal low back pain on the R and neuropathy of both feet. 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  PERTINENT HISTORY: Arthritis, CHF, anxiety, ESRD, DM2, neuropathy, high BMI  PAIN:  Are you having pain? YES  NPRS scale:. 2/10.  Pain location: on the lateral ant and post aspects of the R leg Pain description: Burning, sharp, swelling Aggravating factors: Prolonged time on feet, lying on R side Relieving factors: Massager, rest  PRECAUTIONS: None  RED FLAGS: None   WEIGHT BEARING RESTRICTIONS: No  FALLS:  Has patient fallen in last 6 months? No  LIVING ENVIRONMENT: Lives with: lives with their family Lives in: House/apartment Stairs: No Able to access home  OCCUPATION: Disability  PLOF: Independent  PATIENT GOALS: Pain relief  NEXT MD VISIT: 07/15/23  OBJECTIVE:  Note: Objective measures were completed at Evaluation unless otherwise noted.  DIAGNOSTIC FINDINGS:   MRI Findings: There is focal fat deposition/small intramuscular lipoma within the proximal right adductor magnus (7:29). Mild fatty atrophy of the visualized right thigh musculature. Insertional gluteus minimus and medius tendinosis. Right common hamstring origin tendinosis. Mild intramuscular edema within the quadratus femoris with narrowed ischiofemoral interval. Tendinosis of the distal obturator externus. The hip and knee are approximated on the large field-of-view images. Degenerative subchondral signal changes with cartilage thinning at the knee. Trace right knee effusion. The neurovascular bundles are normal in course and contour. There is mild subcutaneous edema in the superficial soft tissues of the lateral right hip. No suspicious enhancing soft tissue or osseous lesion within the right thigh. No rim-enhancing fluid collection. Partially visualized  postsurgical changes in the right pelvis.   PATIENT SURVEYS:  LEFS: 31/80= 39% 08/25/23: 58%   COGNITION: Overall cognitive status: Within functional limits for tasks assessed     SENSATION: WFL  EDEMA:  Swelling of the R thigh  MUSCLE LENGTH: Hamstrings: Right limited due to pain deg; Left WNLs deg Debby test: Right WNLs deg; Left WNLs deg  POSTURE: rounded shoulders, forward head, and flexed trunk   PALPATION: Markedly TTP to the Ant/lat R thigh, R greater trochanter area, post. thigh T LOWER EXTREMITY ROM:  Active ROM Right eval Left eval Right 07/28/23 Rt 08/12/23  Hip flexion 70 pain  90+ 100 deg   Hip extension      Hip abduction 25 pain   30  Hip adduction      Hip internal rotation      Hip external rotation 20 pain   40  Knee flexion      Knee extension      Ankle dorsiflexion  Ankle plantarflexion      Ankle inversion      Ankle eversion       (Blank rows = not tested)  LOWER EXTREMITY MMT:  All MMT for the R leg were limited by pain MMT Right eval Left eval Rt. 08/12/23 Lt  08/12/23 Right 08/25/23  Hip flexion 2  3+ 5   Hip extension 2      Hip abduction 2    3  Hip adduction 2      Hip internal rotation 2      Hip external rotation 2    3  Knee flexion 3  4- 5   Knee extension 3  4- 5   Ankle dorsiflexion 3  4 5    Ankle plantarflexion 2      Ankle inversion       Ankle eversion        (Blank rows = not tested)  LOWER EXTREMITY SPECIAL TESTS:  NT  FUNCTIONAL TESTS:  5 times sit to stand: TBA 2 minute walk test: TBA 07/15/23 14.9 sec  5 x STS  with UE 356 feet 2 MWT 08/25/23: see goal section   GAIT: Distance walked: 117ft Assistive device utilized: None Level of assistance: Complete Independence Comments: Antalgic gait pattern                                                                                                                                TREATMENT DATE:     [facility] Adult PT Treatment:                                                 DATE: 08/31/23 Therapeutic Exercise: NuStep L4 UE and LE for 6 min  Banded clam  Banded march  Banded bridge 2 x 10  Sidelying GTB clam  Sidelying clam GTB x 10 and x 10 no band , bilateral  Hip abduction x 10 (Sidelying) bilateral  Supine SLR x 10 B Standing hip abd Standing extension  Step up 6 inch  Squat  x 10  Hip flexor stretch  Piriformis stretch  push pull x 3 used towel   [facility] Adult PT Treatment:                                                DATE: 08/27/23 Therapeutic Exercise: Rec Bike 2 x 5 minutes  Knee ext 5# bilat 10 x 2  Knee flexion 15# bilat 10 x 2 Leg Press 25# bilateral 10 x 2  Hip abduction x 12 left, right unable to lift  Side clam left and  right 10 x 2 right and left  Bridge 10 x 2  SLR 10 x 2 each Standing hip abduction   [facility] Adult PT Treatment:                                                DATE: 08/25/23 Therapeutic Exercise: Hip abduction x 10  Side clam 10 x 2 right  Bridge 10 x 2  SLR 10 x 2  Knee ext 5# bilat 8 x 2  Knee flexion 20# bilat 8 x 2 (reduced to 15# second set) Leg Press 25# bilateral  Rec Bike L1 x 3.5 minutes   Therapeutic Activity:  LEFS MMT   PATIENT EDUCATION:  Education details: Eval findings, POC, HEP, self care  Person educated: Patient Education method: Explanation, Demonstration, Tactile cues, Verbal cues, and Handouts Education comprehension: verbalized understanding, returned demonstration, verbal cues required, and tactile cues required  HOME EXERCISE PROGRAM: Access Code: [email or web address] URL: [email or web address] Date: 07/21/2023 Prepared by: Dasie Daft  Exercises - Supine Quad Set  - 1 x daily - 7 x weekly - 1 sets - 10 reps - 5 hold - Supine Heel Slides  - 1 x daily - 7 x weekly - 1 sets - 10 reps - 3 hold - Supine Isometric Hamstring Set  - 1 x daily - 7 x weekly - 1 sets - 10 reps - 5 hold - Supine Hip Abduction with Resistance at Ankles  - 1 x daily - 7 x weekly -  1 sets - 10 reps - 5 hold - Supine Gluteal Sets  - 1 x daily - 7 x weekly - 1 sets - 10 reps - 5 hold - Hooklying Bilateral Isometric Clamshell  - 1 x daily - 7 x weekly - 1 sets - 10 reps - 5 hold - Supine Knee Extension Strengthening  - 1 x daily - 7 x weekly - 1 sets - 10 reps - 5 hold Added - Standing Hip Abduction with Counter Support  - 1 x daily - 7 x weekly - 2 sets - 10 reps  ASSESSMENT:  CLINICAL IMPRESSION:  Patient with limited tolerance with mat exercises, needs cues for technique at times and encouragement to continue, discerning pain from soreness.  She does plan to join [employer] but has not done this yet. Patient reports sense of pulling in Rt hip flexor off edge of table and does this at home already.  Offered light stretching to Rt hip end of session  She will cont to benefit from continued PT to build endurance, activity tolerance.  She is concerned about the swelling in her Rt thigh.  She will be re-evaluate for progress in the next couple of visits.   EVAL :Patient is a 48 y.o. female who was seen today for physical therapy evaluation and treatment for M76.891 (ICD-10-CM) - Other specified enthesopathies of right lower limb, excluding foot. Pt presents to PT with decreased R LE AROM, strength, and function consistent with the referring Dx. Per LEFS, pt's rates the functional use of the R LE as low, 39%. Pt will benefit from skilled PT to address impairments to optimize function with less pain.   OBJECTIVE IMPAIRMENTS: decreased activity tolerance, decreased mobility, difficulty walking, decreased ROM, decreased strength, increased edema, obesity, and pain.   ACTIVITY LIMITATIONS: carrying, lifting, bending, sitting, standing, squatting, sleeping, stairs, transfers, bed mobility, bathing, toileting, dressing, locomotion level, and caring for others  PARTICIPATION  LIMITATIONS: meal prep, cleaning, laundry, driving, shopping, and community activity  PERSONAL FACTORS:  Fitness, Past/current experiences, Time since onset of injury/illness/exacerbation, and 3+ comorbidities: Arthritis, CHF, anxiety, ESRD, DM2, neuropathy, high BMI are also affecting patient's functional outcome.   REHAB POTENTIAL: Good  CLINICAL DECISION MAKING: Evolving/moderate complexity  EVALUATION COMPLEXITY: Moderate   GOALS:   SHORT TERM GOALS: Target date: 07/30/23 Pt will be Ind in an initial HEP  Baseline: started Goal status: MET   2.  Pt will voice understanding of measures to assist in pain reduction  Baseline: OTC tylenol , ice/heat, elevate leg Goal status: MET   3.  Increase R hip flexion AROM to 90d for improved function and as indication of improved pain Baseline:  Goal status: MET   LONG TERM GOALS: Target date: 09/03/23  Pt will be Ind in a final HEP to maintain achieved LOF  Baseline:  Goal status:ongoing   2.  Increased R hip AROM to flexion 100d, ER 35d, and abd to 40d for improved function with a normalized gait pattern and as indication of improved pain. Baseline: 70, 20, and 25 respectively 08/12/23: 100, 30, 40 Goal status: PARTIALLY MET   3.  Increase R hip strength to 3/5 and knee strength to 4/5 for improved functional use of the R LE with a normalized gait pattern Baseline: see above Goal status: ongoing   4.  Improve 5xSTS by MCID of 5 and by MCID of 20ft as indication of improved functional mobility  Baseline: TBA 7/10/5: 356 feet and 14.9 sec using UE 08/25/23: 417 feet and 13.4 sec with UE  (26 sec without UE )  Goal status: ongoing   5.  Pt's LEFS score will improve by the MCID to 54% as indication of improved function  Baseline: 39% 08/25/23: 58% Goal status: MET     PLAN:  PT FREQUENCY: 2x/week  PT DURATION: 6 weeks  PLANNED INTERVENTIONS: [id number]- PT Re-evaluation, 97110-Therapeutic exercises, 97530- Therapeutic activity, 97112- Neuromuscular re-education, 97535- Self Care, [id number]- Manual therapy, [id number]- Gait training,  [phone]- Aquatic Therapy, [phone]- Electrical stimulation (unattended), 97016- Vasopneumatic device, 20560 (1-2 muscles), 20561 (3+ muscles)- Dry Needling, Patient/Family education, Balance training, Stair training, Taping, Joint mobilization, Cryotherapy, and Moist heat  PLAN FOR NEXT SESSION: ; try more closed chain, nustep.  Rt LE strength and manual. assess response to HEP; progress therex as indicated; use of modalities, manual therapy; and TPDN as indicated.    Delon Norma, PT 08/31/23 11:51 AM Phone: [phone] Fax: [phone]

## 2023-08-31 ENCOUNTER — Ambulatory Visit: Admitting: Physical Therapy

## 2023-08-31 ENCOUNTER — Encounter: Payer: Self-pay | Admitting: Physical Therapy

## 2023-08-31 DIAGNOSIS — M79604 Pain in right leg: Secondary | ICD-10-CM

## 2023-08-31 DIAGNOSIS — R262 Difficulty in walking, not elsewhere classified: Secondary | ICD-10-CM

## 2023-08-31 DIAGNOSIS — R600 Localized edema: Secondary | ICD-10-CM

## 2023-09-02 ENCOUNTER — Ambulatory Visit: Admitting: Physical Therapy

## 2023-09-02 ENCOUNTER — Other Ambulatory Visit
Admission: RE | Admit: 2023-09-02 | Discharge: 2023-09-02 | Disposition: A | Discharge status: Home / Self Care | Source: Ambulatory Visit | Attending: Nephrology | Admitting: Nephrology

## 2023-09-02 DIAGNOSIS — Z114 Encounter for screening for human immunodeficiency virus [HIV]: Secondary | ICD-10-CM | POA: Insufficient documentation

## 2023-09-02 DIAGNOSIS — Z789 Other specified health status: Secondary | ICD-10-CM | POA: Insufficient documentation

## 2023-09-02 DIAGNOSIS — E1129 Type 2 diabetes mellitus with other diabetic kidney complication: Secondary | ICD-10-CM | POA: Insufficient documentation

## 2023-09-02 DIAGNOSIS — D631 Anemia in chronic kidney disease: Secondary | ICD-10-CM | POA: Insufficient documentation

## 2023-09-02 DIAGNOSIS — N189 Chronic kidney disease, unspecified: Secondary | ICD-10-CM | POA: Insufficient documentation

## 2023-09-02 DIAGNOSIS — Z9483 Pancreas transplant status: Secondary | ICD-10-CM | POA: Insufficient documentation

## 2023-09-02 DIAGNOSIS — Z09 Encounter for follow-up examination after completed treatment for conditions other than malignant neoplasm: Secondary | ICD-10-CM | POA: Diagnosis not present

## 2023-09-02 DIAGNOSIS — N390 Urinary tract infection, site not specified: Secondary | ICD-10-CM | POA: Diagnosis not present

## 2023-09-02 DIAGNOSIS — E559 Vitamin D deficiency, unspecified: Secondary | ICD-10-CM | POA: Diagnosis not present

## 2023-09-02 DIAGNOSIS — B259 Cytomegaloviral disease, unspecified: Secondary | ICD-10-CM | POA: Diagnosis not present

## 2023-09-02 DIAGNOSIS — D899 Disorder involving the immune mechanism, unspecified: Secondary | ICD-10-CM | POA: Insufficient documentation

## 2023-09-02 DIAGNOSIS — Z940 Kidney transplant status: Secondary | ICD-10-CM | POA: Diagnosis not present

## 2023-09-02 LAB — BASIC METABOLIC PANEL WITH GFR
Anion gap: 10 (ref 5–15)
BUN: 26 mg/dL — ABNORMAL HIGH (ref 6–20)
CO2: 23 mmol/L (ref 22–32)
Calcium: 10.4 mg/dL — ABNORMAL HIGH (ref 8.9–10.3)
Chloride: 106 mmol/L (ref 98–111)
Creatinine, Ser: 1.41 mg/dL — ABNORMAL HIGH (ref 0.44–1.00)
GFR, Estimated: 45 mL/min — ABNORMAL LOW (ref >60)
Glucose, Bld: 111 mg/dL — ABNORMAL HIGH (ref 70–99)
Potassium: 4.2 mmol/L (ref 3.5–5.1)
Sodium: 139 mmol/L (ref 135–145)

## 2023-09-02 LAB — CBC WITH DIFFERENTIAL/PLATELET
Abs Immature Granulocytes: 0.02 K/uL (ref 0.00–0.07)
Basophils Absolute: 0 K/uL (ref 0.0–0.1)
Basophils Relative: 1 %
Eosinophils Absolute: 0.1 K/uL (ref 0.0–0.5)
Eosinophils Relative: 2 %
HCT: 38.7 % (ref 36.0–46.0)
Hemoglobin: 11.8 g/dL — ABNORMAL LOW (ref 12.0–15.0)
Immature Granulocytes: 1 %
Lymphocytes Relative: 22 %
Lymphs Abs: 0.9 K/uL (ref 0.7–4.0)
MCH: 23.5 pg — ABNORMAL LOW (ref 26.0–34.0)
MCHC: 30.5 g/dL (ref 30.0–36.0)
MCV: 76.9 fL — ABNORMAL LOW (ref 80.0–100.0)
Monocytes Absolute: 0.4 K/uL (ref 0.1–1.0)
Monocytes Relative: 10 %
Neutro Abs: 2.5 K/uL (ref 1.7–7.7)
Neutrophils Relative %: 64 %
Platelets: 234 K/uL (ref 150–400)
RBC: 5.03 MIL/uL (ref 3.87–5.11)
RDW: 14.7 % (ref 11.5–15.5)
WBC: 3.9 K/uL — ABNORMAL LOW (ref 4.0–10.5)
nRBC: 0 % (ref 0.0–0.2)

## 2023-09-02 LAB — URINALYSIS, ROUTINE W REFLEX MICROSCOPIC
Bilirubin Urine: NEGATIVE
Glucose, UA: NEGATIVE mg/dL
Hgb urine dipstick: NEGATIVE
Ketones, ur: 5 mg/dL — AB
Leukocytes,Ua: NEGATIVE
Nitrite: NEGATIVE
Protein, ur: 100 mg/dL — AB
Specific Gravity, Urine: 1.021 (ref 1.005–1.030)
pH: 5 (ref 5.0–8.0)

## 2023-09-02 LAB — PHOSPHORUS: Phosphorus: 2.3 mg/dL — ABNORMAL LOW (ref 2.5–4.6)

## 2023-09-02 LAB — PROTEIN / CREATININE RATIO, URINE
Creatinine, Urine: 297 mg/dL
Protein Creatinine Ratio: 0.31 mg/mg{creat} — ABNORMAL HIGH (ref 0.00–0.15)
Total Protein, Urine: 93 mg/dL

## 2023-09-02 LAB — MAGNESIUM: Magnesium: 1.4 mg/dL — ABNORMAL LOW (ref 1.7–2.4)

## 2023-09-03 DIAGNOSIS — G4733 Obstructive sleep apnea (adult) (pediatric): Secondary | ICD-10-CM | POA: Diagnosis not present

## 2023-09-03 LAB — URINE CULTURE: Culture: NO GROWTH

## 2023-09-03 LAB — HCV RNA QUANT: HCV Quantitative: NOT DETECTED [IU]/mL

## 2023-09-04 LAB — TACROLIMUS LEVEL: Tacrolimus (FK506) - LabCorp: 27.8 ng/mL — ABNORMAL HIGH (ref 5.0–20.0)

## 2023-09-06 NOTE — Therapy (Signed)
 [facility] LOWER EXTREMITY TREATMENT/RE-Cert    [name] MRN: [id number] DOB:1975-03-15, 48 y.o., female Today's Date: 09/07/2023  END OF SESSION:  PT End of Session - 09/07/23 1342     Visit Number 13    Number of Visits 19    Date for PT Re-Evaluation 11/12/23    Authorization Type [employer] DUAL [employer]PLETE    Authorization - Visit Number 13    Authorization - Number of Visits 27    PT Start Time 1338    PT Stop Time 1420    PT Time Calculation (min) 42 min    Activity Tolerance Patient tolerated treatment well    Behavior During Therapy WFL for tasks assessed/performed                Past Medical History:  Diagnosis Date   Acid reflux    Anemia 04/2019   REQUIRING BLOOD TRANSFUSIONS   Anxiety    Arthritis    CHF (congestive heart failure) (HCC)    Dyspnea    with exertion   Dysrhythmia    tachycardia   ESRD (end stage renal disease) (HCC)    [facility] [place]   Facial twitching 11/2020   mouth twitch, eyes roll back, neurology started patient on low dose   Sinemet  IR.   GI bleeding 12/2017   Gout    Headache(784.0)    related to chemo; sometimes weekly (09/12/2013)   Heart murmur    Systolic per Dr Salena Negri' notes   High cholesterol    History of blood transfusion a couple   related to low counts   Hypertension    Iron  deficiency anemia    get epogen shots q month (02/20/2014)   MCGN (minimal change glomerulonephritis)    using chemo to tx;  finished my last tx in 12/2013   Neuropathy    Peritoneal dialysis status (HCC)    Stroke (HCC) 08/15/2017   ruled out that it was not a stroke   Past Surgical History:  Procedure Laterality Date   ABDOMINAL HYSTERECTOMY  2010   laparoscopic   ANKLE FRACTURE SURGERY Right 1994   AV FISTULA PLACEMENT Left 04/14/2016   Procedure: ARTERIOVENOUS (AV) FISTULA CREATION;  Surgeon: Lonni GORMAN Blade, MD;  Location: [facility] OR;  Service: Vascular;   Laterality: Left;   AV FISTULA PLACEMENT Left 08/09/2017   Procedure: INSERTION OF ARTERIOVENOUS (AV) GORE-TEX GRAFT LEFT ARM;  Surgeon: Harvey Carlin BRAVO, MD;  Location: [facility] OR;  Service: Vascular;  Laterality: Left;   AV FISTULA PLACEMENT Right 03/15/2020   Procedure: RIGHT ARM ARTERIOVENOUS (AV) FISTULA CREATION 1st Stage Basilic Transposition;  Surgeon: Serene Gaile ORN, MD;  Location: [facility] OR;  Service: Vascular;  Laterality: Right;   AV FISTULA PLACEMENT Right 02/24/2021   Procedure: RIGHT ARM ARTERIOVENOUS  FISTULA CREATION;  Surgeon: Lanis Fonda BRAVO, MD;  Location: [facility] OR;  Service: Vascular;  Laterality: Right;  regional block   AVGG REMOVAL Left 08/11/2017   Procedure: REMOVAL OF ARTERIOVENOUS GORETEX GRAFT (AVGG);  Surgeon: Serene Gaile ORN, MD;  Location: [facility] OR;  Service: Vascular;  Laterality: Left;   BASCILIC VEIN TRANSPOSITION Right 05/17/2020   Procedure: RIGHT UPPER EXTREMITY SECOND STAGE BASCILIC VEIN TRANSPOSITION;  Surgeon: Serene Gaile ORN, MD;  Location: [facility] OR;  Service: Vascular;  Laterality: Right;   BIOPSY  09/03/2017   Procedure: BIOPSY;  Surgeon: Elicia Claw, MD;  Location: [facility] ENDOSCOPY;  Service: Gastroenterology;;   BIOPSY  12/24/2017   Procedure: BIOPSY;  Surgeon: Wilhelmenia Aloha Raddle., MD;  Location: [facility] ENDOSCOPY;  Service: Gastroenterology;;   CARDIAC CATHETERIZATION  2000's   COLONOSCOPY N/A 10/22/2022   Procedure: COLONOSCOPY;  Surgeon: Maryruth Ole DASEN, MD;  Location: [facility] ENDOSCOPY;  Service: Endoscopy;  Laterality: N/A;   COLONOSCOPY WITH PROPOFOL  N/A 09/04/2017   Procedure: COLONOSCOPY WITH PROPOFOL ;  Surgeon: Saintclair Jasper, MD;  Location: [facility] ENDOSCOPY;  Service: Gastroenterology;  Laterality: N/A;   ENTEROSCOPY N/A 12/24/2017   Procedure: ENTEROSCOPY;  Surgeon: Wilhelmenia Aloha Raddle., MD;  Location: [facility] ENDOSCOPY;  Service: Gastroenterology;  Laterality: N/A;   ESOPHAGOGASTRODUODENOSCOPY     ESOPHAGOGASTRODUODENOSCOPY (EGD) WITH PROPOFOL  Left 06/04/2017    Procedure: ESOPHAGOGASTRODUODENOSCOPY (EGD) WITH PROPOFOL ;  Surgeon: Burnette Fallow, MD;  Location: [facility] ENDOSCOPY;  Service: Endoscopy;  Laterality: Left;   ESOPHAGOGASTRODUODENOSCOPY (EGD) WITH PROPOFOL  N/A 09/03/2017   Procedure: ESOPHAGOGASTRODUODENOSCOPY (EGD) WITH PROPOFOL ;  Surgeon: Elicia Claw, MD;  Location: [facility] ENDOSCOPY;  Service: Gastroenterology;  Laterality: N/A;   ESOPHAGOGASTRODUODENOSCOPY (EGD) WITH PROPOFOL  N/A 04/27/2022   Procedure: ESOPHAGOGASTRODUODENOSCOPY (EGD) WITH PROPOFOL ;  Surgeon: Maryruth Ole DASEN, MD;  Location: [facility] ENDOSCOPY;  Service: Endoscopy;  Laterality: N/A;   FISTULA SUPERFICIALIZATION Left 04/02/2017   Procedure: FISTULA SUPERFICIALIZATION LEFT ARM;  Surgeon: Serene Gaile ORN, MD;  Location: [facility] OR;  Service: Vascular;  Laterality: Left;   FISTULA SUPERFICIALIZATION Right 04/14/2021   Procedure: RIGHT ARM FISTULA SUPERFICIALIZATION;  Surgeon: Lanis Fonda BRAVO, MD;  Location: [facility] OR;  Service: Vascular;  Laterality: Right;  PERIPHERAL NERVE BLOCK   FISTULOGRAM Left 04/07/2016   Procedure: FISTULOGRAM;  Surgeon: Penne Lonni Colorado, MD;  Location: [facility] OR;  Service: Vascular;  Laterality: Left;   FRACTURE SURGERY     right ankle   GIVENS CAPSULE STUDY N/A 10/29/2017   Procedure: GIVENS CAPSULE STUDY;  Surgeon: Dianna Specking, MD;  Location: [facility] ENDOSCOPY;  Service: Endoscopy;  Laterality: N/A;   GIVENS CAPSULE STUDY N/A 12/24/2017   Procedure: GIVENS CAPSULE STUDY;  Surgeon: Wilhelmenia Aloha Raddle., MD;  Location: [facility] ENDOSCOPY;  Service: Gastroenterology;  Laterality: N/A;   GIVENS CAPSULE STUDY N/A 05/22/2018   Procedure: GIVENS CAPSULE STUDY;  Surgeon: Wilhelmenia Aloha Raddle., MD;  Location: [facility] ENDOSCOPY;  Service: Gastroenterology;  Laterality: N/A;   INSERTION OF DIALYSIS CATHETER Right 04/07/2016   Procedure: INSERTION OF DIALYSIS CATHETER;  Surgeon: Penne Lonni Colorado, MD;  Location: [facility] OR;  Service: Vascular;  Laterality: Right;   INSERTION OF  DIALYSIS CATHETER Right 04/04/2017   Procedure: INSERTION OF DIALYSIS CATHETER;  Surgeon: [place] Penne Lonni, MD;  Location: [facility] OR;  Service: Vascular;  Laterality: Right;   IR FLUORO GUIDE CV LINE LEFT  03/13/2020   IR US  GUIDE VASC ACCESS LEFT  03/13/2020   LEFT HEART CATH AND CORONARY ANGIOGRAPHY N/A 05/12/2019   Procedure: LEFT HEART CATH AND CORONARY ANGIOGRAPHY;  Surgeon: Claudene Pacific, MD;  [facility] INVASIVE CV LAB;  Service: Cardiovascular;  Laterality: N/A;   LIGATION OF ARTERIOVENOUS  FISTULA Left 04/14/2016   Procedure: LIGATION OF ARTERIOVENOUS  FISTULA;  Surgeon: Lonni GORMAN Blade, MD;  Location: [facility] OR;  Service: Vascular;  Laterality: Left;   LIGATION OF COMPETING BRANCHES OF ARTERIOVENOUS FISTULA Right 05/17/2020   Procedure: LIGATION OF COMPETING BRANCHES OF RIGHT ARM ARTERIOVENOUS FISTULA;  Surgeon: Serene Gaile ORN, MD;  Location: [facility] OR;  Service: Vascular;  Laterality: Right;   PATCH ANGIOPLASTY Left 06/19/2016   Procedure: PATCH ANGIOPLASTY LEFT ARTERIOVENOUS FISTULA;  Surgeon: Blade Lonni GORMAN, MD;  Location: [facility] OR;  Service: Vascular;  Laterality: Left;   POLYPECTOMY  09/04/2017   Procedure: POLYPECTOMY;  Surgeon: Saintclair Jasper, MD;  Location: [facility] ENDOSCOPY;  Service: Gastroenterology;;   REVISON OF ARTERIOVENOUS FISTULA Left 06/19/2016   Procedure: REVISION SUPERFICIALIZATION OF BRACHIOCEPHALIC ARTERIOVENOUS FISTULA;  Surgeon: Eliza Lonni RAMAN, MD;  Location: [facility] OR;  Service: Vascular;  Laterality: Left;   UPPER EXTREMITY VENOGRAPHY Bilateral 02/19/2021   Procedure: UPPER EXTREMITY VENOGRAPHY;  Surgeon: Lanis Fonda BRAVO, MD;  Location: [facility] INVASIVE CV LAB;  Service: Cardiovascular;  Laterality: Bilateral;   Patient Active Problem List   Diagnosis Date Noted   Gastroparesis 12/31/2022   OSA on CPAP 12/31/2022   Rectal bleeding 10/23/2022   Acute on chronic anemia 10/19/2022   Obesity (BMI 30-39.9) 08/25/2022   Acute encephalopathy 08/24/2022   (HFpEF) heart  failure with preserved ejection fraction (HCC) 08/24/2022   Acute respiratory distress 08/11/2022   Pneumonia due to COVID-19 virus 08/10/2022   Facial twitching 08/09/2022   CAP (community acquired pneumonia) 07/21/2022   Acute pulmonary edema (HCC) 07/20/2022   Hypervolemia associated with renal insufficiency 05/11/2022   Hyperkalemia 05/11/2022   Restless leg syndrome 04/23/2022   Anaphylactic shock, unspecified, initial encounter 09/07/2021   Gout 08/21/2021   GERD without esophagitis 08/21/2021   Anxiety and depression 08/21/2021   Neuropathic pain 05/26/2021   Dystonia 01/13/2021   Polyneuropathy associated with underlying disease (HCC) 01/13/2021   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   Other specified complication of vascular prosthetic devices, implants and grafts, initial encounter (HCC) 09/03/2020   Contact with and (suspected) exposure to covid-19 08/13/2020   Altered mental status, unspecified 06/25/2020   Other irregular eye movements 06/25/2020   PD catheter dysfunction (HCC) 04/26/2020   Other disorders of phosphorus metabolism 04/17/2020   Hypercalcemia 04/15/2020   Impacted cerumen of both ears 04/05/2020   Chronic gouty arthritis 04/05/2020   GAD (generalized anxiety disorder) 04/05/2020   History of hypertension 04/05/2020   Peripheral neuropathy 04/05/2020   Primary insomnia 04/05/2020   Malnutrition of moderate degree 03/13/2020   Hypertensive urgency 03/11/2020   Myoclonia 03/11/2020   Abscess of vulva 02/19/2020   Pericolonic abscess 02/06/2020   Chest pain 05/10/2019   Anemia 04/25/2019   SVT (supraventricular tachycardia) (HCC) 04/25/2019   Elevated troponin 04/25/2019   ESRD on dialysis (HCC) 04/25/2019   Bacterial infection, unspecified 04/17/2019   Pain, unspecified 04/17/2019   Pruritus, unspecified 04/17/2019   Benign paroxysmal positional vertigo of left ear 08/17/2018   Acute on chronic blood loss anemia 05/21/2018   Lower GI bleed 05/20/2018    Symptomatic anemia 10/28/2017   Acidosis 05/24/2017   Chills (without fever) 05/24/2017   Coagulation defect, unspecified (HCC) 05/24/2017   Dependence on renal dialysis (HCC) 05/24/2017   Encounter for fitting and adjustment of extracorporeal dialysis catheter (HCC) 05/24/2017   Fluid overload, unspecified 05/24/2017   Iron  deficiency anemia, unspecified 05/24/2017   Liver disease, unspecified 05/24/2017   Other disorders of electrolyte and fluid balance, not elsewhere classified 05/24/2017   Other disorders resulting from impaired renal tubular function 05/24/2017   Other long term (current) drug therapy 05/24/2017   Secondary hyperparathyroidism of renal origin (HCC) 05/24/2017   Unspecified abnormal findings in urine 05/24/2017   Unspecified jaundice 05/24/2017   Chronic kidney disease 05/24/2017   Type 2 diabetes mellitus with other diabetic kidney complication (HCC) 05/24/2017   ESRD on peritoneal dialysis (HCC) 04/01/2016   Anemia due to chronic kidney disease 04/01/2016   GERD (gastroesophageal reflux disease) 04/01/2016   Atypical chest pain    Nephrotic syndrome 10/02/2013  Dyslipidemia 10/02/2013   PAT (paroxysmal atrial tachycardia) (HCC) 09/29/2013   Normocytic anemia 06/29/2013   Glomerulonephritis 01/28/2013   Dyspnea 07/04/2011   Morbid obesity (HCC) 07/04/2011   Essential hypertension 07/04/2011   Non-insulin  dependent type 2 diabetes mellitus (HCC) 07/04/2011   Hypercholesterolemia 07/04/2011    PCP: Jesus Griffes, MD   REFERRING PROVIDER: Jesus Griffes, MD   REFERRING DIAG: [phone] (ICD-10-CM) - Other specified enthesopathies of right lower limb, excluding foot   THERAPY DIAG:  Pain in right leg  Difficulty in walking, not elsewhere classified  Localized edema  Rationale for Evaluation and Treatment: Rehabilitation  ONSET DATE: 01/31/23  SUBJECTIVE:   SUBJECTIVE STATEMENT: Pt reports the R lateral hip/thigh pain continues be painful,  but it is better than it was pior to PT. Pt states she would like to have better tolerance shopping and going up/down steps in her home.  EVAL: Pt reports she has chronic R leg pain, more lateral and on the front and back of her R thigh. She experiences burning, swelling and sharp pain. Pt also has minimal low back pain on the R and neuropathy of both feet. 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  PERTINENT HISTORY: Arthritis, CHF, anxiety, ESRD, DM2, neuropathy, high BMI  PAIN:  Are you having pain? YES  NPRS scale:. 5/10.  Pain location: on the lateral ant and post aspects of the R leg Pain description: Burning, sharp, swelling Aggravating factors: Prolonged time on feet, lying on R side Relieving factors: Massager, rest  PRECAUTIONS: None  RED FLAGS: None   WEIGHT BEARING RESTRICTIONS: No  FALLS:  Has patient fallen in last 6 months? No  LIVING ENVIRONMENT: Lives with: lives with their family Lives in: House/apartment Stairs: No Able to access home  OCCUPATION: Disability  PLOF: Independent  PATIENT GOALS: Pain relief  NEXT MD VISIT: 07/15/23  OBJECTIVE:  Note: Objective measures were completed at Evaluation unless otherwise noted.  DIAGNOSTIC FINDINGS:   MRI Findings: There is focal fat deposition/small intramuscular lipoma within the proximal right adductor magnus (7:29). Mild fatty atrophy of the visualized right thigh musculature. Insertional gluteus minimus and medius tendinosis. Right common hamstring origin tendinosis. Mild intramuscular edema within the quadratus femoris with narrowed ischiofemoral interval. Tendinosis of the distal obturator externus. The hip and knee are approximated on the large field-of-view images. Degenerative subchondral signal changes with cartilage thinning at the knee. Trace right knee effusion. The neurovascular bundles are normal in course and contour. There is mild subcutaneous edema in the superficial soft  tissues of the lateral right hip. No suspicious enhancing soft tissue or osseous lesion within the right thigh. No rim-enhancing fluid collection. Partially visualized postsurgical changes in the right pelvis.   PATIENT SURVEYS:  LEFS: 31/80= 39% 08/25/23: 58%   COGNITION: Overall cognitive status: Within functional limits for tasks assessed     SENSATION: WFL  EDEMA:  Swelling of the R thigh  MUSCLE LENGTH: Hamstrings: Right limited due to pain deg; Left WNLs deg Debby test: Right WNLs deg; Left WNLs deg  POSTURE: rounded shoulders, forward head, and flexed trunk   PALPATION: Markedly TTP to the Ant/lat R thigh, R greater trochanter area, post. thigh T LOWER EXTREMITY ROM:  Active ROM Right eval Left eval Right 07/28/23 Rt 08/12/23  Hip flexion 70 pain  90+ 100 deg   Hip extension      Hip abduction 25 pain   30  Hip adduction      Hip internal rotation  Hip external rotation 20 pain   40  Knee flexion      Knee extension      Ankle dorsiflexion      Ankle plantarflexion      Ankle inversion      Ankle eversion       (Blank rows = not tested)  LOWER EXTREMITY MMT:  All MMT for the R leg were limited by pain MMT Right eval Left eval Rt. 08/12/23 Lt  08/12/23 Right 08/25/23 RT 09/07/23  Hip flexion 2  3+ 5  3  Hip extension 2       Hip abduction 2    3 3-  Hip adduction 2       Hip internal rotation 2       Hip external rotation 2    3 3-  Knee flexion 3  4- 5    Knee extension 3  4- 5    Ankle dorsiflexion 3  4 5     Ankle plantarflexion 2       Ankle inversion        Ankle eversion         (Blank rows = not tested)  LOWER EXTREMITY SPECIAL TESTS:  NT  FUNCTIONAL TESTS:  5 times sit to stand: TBA 2 minute walk test: TBA 07/15/23 14.9 sec  5 x STS  with UE 356 feet 2 MWT 08/25/23: see goal section   GAIT: Distance walked: 156ft Assistive device utilized: None Level of assistance: Complete Independence Comments: Antalgic gait pattern                                                                                                                  TREATMENT DATE:   [facility] Adult PT Treatment:                                                DATE: 09/07/23 Therapeutic Exercise: NuStep L4 UE and LE for 6 min  Supine Banded clam x10 10 BluTB Banded bridge x10 10  Standing hip abd RTB x10 10 Standing knee flexion x10 10 MMT  [facility] Adult PT Treatment:                                                DATE: 08/31/23 Therapeutic Exercise: NuStep L4 UE and LE for 6 min  Banded clam  Banded march  Banded bridge 2 x 10  Sidelying GTB clam  Sidelying clam GTB x 10 and x 10 no band , bilateral  Hip abduction x 10 (Sidelying) bilateral  Supine SLR x 10 B Standing hip abd Standing extension  Step up 6 inch  Squat  x 10  Hip flexor stretch  Piriformis stretch  push pull x 3 used  towel   [facility] Adult PT Treatment:                                                DATE: 08/27/23 Therapeutic Exercise: Rec Bike 2 x 5 minutes  Knee ext 5# bilat 10 x 2  Knee flexion 15# bilat 10 x 2 Leg Press 25# bilateral 10 x 2  Hip abduction x 12 left, right unable to lift  Side clam left and  right 10 x 2 right and left  Bridge 10 x 2  SLR 10 x 2 each Standing hip abduction   PATIENT EDUCATION:  Education details: Eval findings, POC, HEP, self care  Person educated: Patient Education method: Explanation, Demonstration, Tactile cues, Verbal cues, and Handouts Education comprehension: verbalized understanding, returned demonstration, verbal cues required, and tactile cues required  HOME EXERCISE PROGRAM: Access Code: [email or web address] URL: [email or web address] Date: 07/21/2023 Prepared by: Dasie Daft  Exercises - Supine Quad Set  - 1 x daily - 7 x weekly - 1 sets - 10 reps - 5 hold - Supine Heel Slides  - 1 x daily - 7 x weekly - 1 sets - 10 reps - 3 hold - Supine Isometric Hamstring Set  - 1 x daily - 7 x weekly - 1 sets - 10 reps - 5 hold -  Supine Hip Abduction with Resistance at Ankles  - 1 x daily - 7 x weekly - 1 sets - 10 reps - 5 hold - Supine Gluteal Sets  - 1 x daily - 7 x weekly - 1 sets - 10 reps - 5 hold - Hooklying Bilateral Isometric Clamshell  - 1 x daily - 7 x weekly - 1 sets - 10 reps - 5 hold - Supine Knee Extension Strengthening  - 1 x daily - 7 x weekly - 1 sets - 10 reps - 5 hold Added - Standing Hip Abduction with Counter Support  - 1 x daily - 7 x weekly - 2 sets - 10 reps  ASSESSMENT:  CLINICAL IMPRESSION: PT was completed for R LE strengthening with prolonged contractions for tissue remodeling. Pt's R hip strength was reassessed and found some improved. Overall, pt has made gains re: pain and function, but she has not progressed to the level she would like to be. She notes limitation with shopping tolerance and how often she limits herself from using the steps at home. Recommend the continuation of PT 1w6 to continue to address pain, strength, tolerance and function and to help transition pt to a fitness center for her to continue with the rehab process.   EVAL :Patient is a 48 y.o. female who was seen today for physical therapy evaluation and treatment for M76.891 (ICD-10-CM) - Other specified enthesopathies of right lower limb, excluding foot. Pt presents to PT with decreased R LE AROM, strength, and function consistent with the referring Dx. Per LEFS, pt's rates the functional use of the R LE as low, 39%. Pt will benefit from skilled PT to address impairments to optimize function with less pain.   OBJECTIVE IMPAIRMENTS: decreased activity tolerance, decreased mobility, difficulty walking, decreased ROM, decreased strength, increased edema, obesity, and pain.   ACTIVITY LIMITATIONS: carrying, lifting, bending, sitting, standing, squatting, sleeping, stairs, transfers, bed mobility, bathing, toileting, dressing, locomotion level, and caring for others  PARTICIPATION LIMITATIONS: meal prep, cleaning, laundry,  driving, shopping, and community  activity  PERSONAL FACTORS: Fitness, Past/current experiences, Time since onset of injury/illness/exacerbation, and 3+ comorbidities: Arthritis, CHF, anxiety, ESRD, DM2, neuropathy, high BMI are also affecting patient's functional outcome.   REHAB POTENTIAL: Good  CLINICAL DECISION MAKING: Evolving/moderate complexity  EVALUATION COMPLEXITY: Moderate   GOALS:   SHORT TERM GOALS: Target date: 07/30/23 Pt will be Ind in an initial HEP  Baseline: started Goal status: MET   2.  Pt will voice understanding of measures to assist in pain reduction  Baseline: OTC tylenol , ice/heat, elevate leg Goal status: MET   3.  Increase R hip flexion AROM to 90d for improved function and as indication of improved pain Baseline:  Goal status: MET   LONG TERM GOALS: Target date: 11/12/23  Pt will be Ind in a final HEP to maintain achieved LOF  Baseline:  Goal status:ongoing   2.  Increased R hip AROM to flexion 100d, ER 35d, and abd to 40d for improved function with a normalized gait pattern and as indication of improved pain. Baseline: 70, 20, and 25 respectively 08/12/23: 100, 30, 40 Goal status: PARTIALLY MET   3.  Increase R hip strength to 3/5 and knee strength to 4/5 for improved functional use of the R LE with a normalized gait pattern Baseline: see above 09/07/23: see flow sheet Goal status: ONGOING  4.  Improve 5xSTS by MCID of 5 and by MCID of 77ft as indication of improved functional mobility  Baseline: TBA 7/10/5: 356 feet and 14.9 sec using UE 08/25/23: 417 feet and 13.4 sec with UE  (26 sec without UE )  Goal status: MET  5.  Pt's LEFS score will improve by the MCID to 54% as indication of improved function  Baseline: 39% 08/25/23: 58% Goal status: MET    6. Pt will be able to shop for groceries for 30 mins or greater without having to use a powered cart. Baseline: 10 mins Goal status: NEW    7. Pt will be able to go up/down the steps  in her home 4x a day for improved functional mobility at home Baseline: up and down 2x a day Goal status: NEW    PLAN:     PT FREQUENCY: 1x/ week  PT DURATION: 6 weeks  PLANNED INTERVENTIONS: [id number]- PT Re-evaluation, 97110-Therapeutic exercises, 97530- Therapeutic activity, 97112- Neuromuscular re-education, 97535- Self Care, [id number]- Manual therapy, [id number]- Gait training, [phone]- Aquatic Therapy, [phone]- Electrical stimulation (unattended), 97016- Vasopneumatic device, 20560 (1-2 muscles), 20561 (3+ muscles)- Dry Needling, Patient/Family education, Balance training, Stair training, Taping, Joint mobilization, Cryotherapy, and Moist heat  PLAN FOR NEXT SESSION: ; try more closed chain, nustep.  Rt LE strength and manual. assess response to HEP; progress therex as indicated; use of modalities, manual therapy; and TPDN as indicated.   Revere Maahs MS, PT 09/07/23 2:59 PM

## 2023-09-07 ENCOUNTER — Ambulatory Visit: Attending: Internal Medicine

## 2023-09-07 DIAGNOSIS — R600 Localized edema: Secondary | ICD-10-CM | POA: Diagnosis not present

## 2023-09-07 DIAGNOSIS — M79604 Pain in right leg: Secondary | ICD-10-CM | POA: Diagnosis not present

## 2023-09-07 DIAGNOSIS — R262 Difficulty in walking, not elsewhere classified: Secondary | ICD-10-CM | POA: Insufficient documentation

## 2023-09-12 ENCOUNTER — Other Ambulatory Visit: Payer: Self-pay

## 2023-09-12 ENCOUNTER — Emergency Department
Admission: EM | Admit: 2023-09-12 | Discharge: 2023-09-12 | Disposition: A | Discharge status: Home / Self Care | Attending: Emergency Medicine | Admitting: Emergency Medicine

## 2023-09-12 ENCOUNTER — Emergency Department

## 2023-09-12 DIAGNOSIS — G6289 Other specified polyneuropathies: Secondary | ICD-10-CM

## 2023-09-12 DIAGNOSIS — M79673 Pain in unspecified foot: Secondary | ICD-10-CM | POA: Diagnosis present

## 2023-09-12 DIAGNOSIS — I110 Hypertensive heart disease with heart failure: Secondary | ICD-10-CM | POA: Insufficient documentation

## 2023-09-12 DIAGNOSIS — E1142 Type 2 diabetes mellitus with diabetic polyneuropathy: Secondary | ICD-10-CM | POA: Insufficient documentation

## 2023-09-12 DIAGNOSIS — I5030 Unspecified diastolic (congestive) heart failure: Secondary | ICD-10-CM | POA: Diagnosis not present

## 2023-09-12 DIAGNOSIS — M7989 Other specified soft tissue disorders: Secondary | ICD-10-CM | POA: Diagnosis not present

## 2023-09-12 DIAGNOSIS — G629 Polyneuropathy, unspecified: Secondary | ICD-10-CM | POA: Diagnosis not present

## 2023-09-12 DIAGNOSIS — M79604 Pain in right leg: Secondary | ICD-10-CM | POA: Diagnosis not present

## 2023-09-12 LAB — COMPREHENSIVE METABOLIC PANEL WITH GFR
ALT: 5 U/L (ref 0–44)
AST: 15 U/L (ref 15–41)
Albumin: 3.9 g/dL (ref 3.5–5.0)
Alkaline Phosphatase: 123 U/L (ref 38–126)
Anion gap: 8 (ref 5–15)
BUN: 21 mg/dL — ABNORMAL HIGH (ref 6–20)
CO2: 24 mmol/L (ref 22–32)
Calcium: 10 mg/dL (ref 8.9–10.3)
Chloride: 105 mmol/L (ref 98–111)
Creatinine, Ser: 1.22 mg/dL — ABNORMAL HIGH (ref 0.44–1.00)
GFR, Estimated: 54 mL/min — ABNORMAL LOW (ref >60)
Glucose, Bld: 108 mg/dL — ABNORMAL HIGH (ref 70–99)
Potassium: 4.2 mmol/L (ref 3.5–5.1)
Sodium: 137 mmol/L (ref 135–145)
Total Bilirubin: 0.5 mg/dL (ref 0.0–1.2)
Total Protein: 7.2 g/dL (ref 6.5–8.1)

## 2023-09-12 LAB — CBC WITH DIFFERENTIAL/PLATELET
Abs Immature Granulocytes: 0.01 K/uL (ref 0.00–0.07)
Basophils Absolute: 0 K/uL (ref 0.0–0.1)
Basophils Relative: 1 %
Eosinophils Absolute: 0.1 K/uL (ref 0.0–0.5)
Eosinophils Relative: 3 %
HCT: 38.5 % (ref 36.0–46.0)
Hemoglobin: 11.7 g/dL — ABNORMAL LOW (ref 12.0–15.0)
Immature Granulocytes: 0 %
Lymphocytes Relative: 26 %
Lymphs Abs: 0.8 K/uL (ref 0.7–4.0)
MCH: 23 pg — ABNORMAL LOW (ref 26.0–34.0)
MCHC: 30.4 g/dL (ref 30.0–36.0)
MCV: 75.6 fL — ABNORMAL LOW (ref 80.0–100.0)
Monocytes Absolute: 0.3 K/uL (ref 0.1–1.0)
Monocytes Relative: 11 %
Neutro Abs: 1.8 K/uL (ref 1.7–7.7)
Neutrophils Relative %: 59 %
Platelets: 230 K/uL (ref 150–400)
RBC: 5.09 MIL/uL (ref 3.87–5.11)
RDW: 14.4 % (ref 11.5–15.5)
WBC: 3.1 K/uL — ABNORMAL LOW (ref 4.0–10.5)
nRBC: 0 % (ref 0.0–0.2)

## 2023-09-12 LAB — LACTIC ACID, PLASMA: Lactic Acid, Venous: 1.5 mmol/L (ref 0.5–1.9)

## 2023-09-12 MED ORDER — CYCLOBENZAPRINE HCL 5 MG PO TABS: 5.0000 mg | ORAL_TABLET | Freq: Three times a day (TID) | ORAL | 0 refills | Status: AC | PRN

## 2023-09-12 MED ORDER — HYDROCODONE-ACETAMINOPHEN 5-325 MG PO TABS: 1.0000 | ORAL_TABLET | Freq: Four times a day (QID) | ORAL | 0 refills | Status: DC | PRN

## 2023-09-12 MED ORDER — DIPHENHYDRAMINE HCL 25 MG PO CAPS
50.0000 mg | ORAL_CAPSULE | Freq: Once | ORAL | Status: AC
  Administered 2023-09-12: 50 mg via ORAL
  Filled 2023-09-12: qty 2

## 2023-09-12 MED ORDER — HYDROMORPHONE HCL 1 MG/ML IJ SOLN
1.0000 mg | Freq: Once | INTRAMUSCULAR | Status: DC
  Filled 2023-09-12: qty 1

## 2023-09-12 MED ORDER — HYDROMORPHONE HCL 1 MG/ML IJ SOLN
1.0000 mg | Freq: Once | INTRAMUSCULAR | Status: AC
  Administered 2023-09-12: 1 mg via INTRAMUSCULAR

## 2023-09-12 NOTE — ED Provider Notes (Signed)
 [facility] Provider Note    Event Date/Time   First MD Initiated Contact with Patient 09/12/23 1654     (approximate)   History   Chief Complaint Foot Pain   HPI  Patricia [name] is a 48 y.o. female with past medical history of hypertension, diabetes, renal transplant, HFpEF, and iron  deficiency anemia who presents to the ED complaining of foot pain.  Patient reports that she has been dealing with increasing tingling pain in both hands and feet over the past week.  She states that she has dealt with neuropathy for at least the past 6 months resulting in numbness of both hands and feet along with tingling discomfort.  She was previously taking gabapentin  for this without relief, recently prescribed Keppra by her PCP.  She was previously seen by neurology for this problem, but states she has not seen them since she stopped taking gabapentin .  She has noticed some swelling in her right leg but denies any redness or warmth.  She has not had any weakness in her extremities.     Physical Exam   Triage Vital Signs: ED Triage Vitals  Encounter Vitals Group     BP 09/12/23 1549 (!) 144/94     Girls Systolic BP Percentile --      Girls Diastolic BP Percentile --      Boys Systolic BP Percentile --      Boys Diastolic BP Percentile --      Pulse Rate 09/12/23 1549 65     Resp 09/12/23 1549 18     Temp 09/12/23 1549 98.4 F (36.9 C)     Temp src --      SpO2 09/12/23 1549 98 %     Weight 09/12/23 1550 228 lb (103.4 kg)     Height 09/12/23 1550 5' 7 (1.702 m)     Head Circumference --      Peak Flow --      Pain Score 09/12/23 1550 10     Pain Loc --      Pain Education --      Exclude from Growth Chart --     Most recent vital signs: Vitals:   09/12/23 1549 09/12/23 1726  BP: (!) 144/94   Pulse: 65   Resp: 18   Temp: 98.4 F (36.9 C)   SpO2: 98% 98%    Constitutional: Alert and oriented. Eyes: Conjunctivae are normal. Head:  Atraumatic. Nose: No congestion/rhinnorhea. Mouth/Throat: Mucous membranes are moist.  Cardiovascular: Normal rate, regular rhythm. Grossly normal heart sounds.  2+ radial and DP pulses bilaterally. Respiratory: Normal respiratory effort.  No retractions. Lungs CTAB. Gastrointestinal: Soft and nontender. No distention. Musculoskeletal: Trace edema to the right lower extremity with associated calf tenderness, no erythema or warmth.  No edema or tenderness to the left lower extremity or bilateral upper extremities. Neurologic:  Normal speech and language. No gross focal neurologic deficits are appreciated.    ED Results / Procedures / Treatments   Labs (all labs ordered are listed, but only abnormal results are displayed) Labs Reviewed  CBC WITH DIFFERENTIAL/PLATELET - Abnormal; Notable for the following components:      Result Value   WBC 3.1 (*)    Hemoglobin 11.7 (*)    MCV 75.6 (*)    MCH 23.0 (*)    All other components within normal limits  COMPREHENSIVE METABOLIC PANEL WITH GFR - Abnormal; Notable for the following components:   Glucose, Bld 108 (*)  BUN 21 (*)    Creatinine, Ser 1.22 (*)    GFR, Estimated 54 (*)    All other components within normal limits  LACTIC ACID, PLASMA    RADIOLOGY Right lower extremity ultrasound reviewed and interpreted by me with no evidence of DVT.  PROCEDURES:  Critical Care performed: No  Procedures   MEDICATIONS ORDERED IN ED: Medications  HYDROmorphone  (DILAUDID ) injection 1 mg (1 mg Intramuscular Given 09/12/23 1809)  diphenhydrAMINE  (BENADRYL ) capsule 50 mg (50 mg Oral Given 09/12/23 1808)     IMPRESSION / MDM / ASSESSMENT AND PLAN / ED COURSE  I reviewed the triage vital signs and the nursing notes.                              48 y.o. female with past medical history of hypertension, diabetes, renal transplant, HFpEF, and iron  deficiency anemia who presents to the ED with acute on chronic tingling pain in all 4 extremities  with some swelling to her right lower extremity.  Patient's presentation is most consistent with acute presentation with potential threat to life or bodily function.  Differential diagnosis includes, but is not limited to, arterial insufficiency, venous insufficiency, peripheral neuropathy, cellulitis, DVT.  Patient nontoxic-appearing and in no acute distress, vital signs are unremarkable.  Labs with mild leukopenia similar to previous, no significant anemia or electrolyte abnormality, renal function comparable to previous.  She has strong pulses throughout her extremities and lactic acid within normal limits, no evidence of infectious process.  She does have some mild swelling of her right lower extremity, will further assess with ultrasound to rule out DVT.  Symptoms seem to be an exacerbation of chronic pain related to peripheral neuropathy.  Patient with difficult IV access, will treat pain with IM Dilaudid  and patient also requests Benadryl  for itching.  Ultrasound is negative for DVT or other acute finding, pain improved on reassessment.  Patient appropriate for outpatient management of acute on chronic pain related to peripheral neuropathy.  Referral provided to pain management and she was counseled to return to the ED for new or worsening symptoms, patient agrees with plan.      FINAL CLINICAL IMPRESSION(S) / ED DIAGNOSES   Final diagnoses:  Other polyneuropathy     Rx / DC Orders   ED Discharge Orders          Ordered    HYDROcodone -acetaminophen  (NORCO/VICODIN) 5-325 MG tablet  Every 6 hours PRN        09/12/23 1838    cyclobenzaprine  (FLEXERIL ) 5 MG tablet  3 times daily PRN        09/12/23 1838             Note:  This document was prepared using Dragon voice recognition software and may include unintentional dictation errors.   Willo Dunnings, MD 09/12/23 [phone]

## 2023-09-12 NOTE — ED Triage Notes (Signed)
 Pt comes in via pov with complaints of pain in her fingers and feet that started Friday. Pt states that she had a kidney transplant in January, and notices that her right hip is swollen sore, and feels like the pain in her feet is starting to radiate up her legs. Pt was seen by her primary care doctor whom raised her medication dosage with no relief.  Pt complains of pain 10/10 at this time.

## 2023-09-20 DIAGNOSIS — R601 Generalized edema: Secondary | ICD-10-CM | POA: Diagnosis not present

## 2023-09-20 DIAGNOSIS — K3189 Other diseases of stomach and duodenum: Secondary | ICD-10-CM | POA: Diagnosis not present

## 2023-09-20 DIAGNOSIS — N1832 Chronic kidney disease, stage 3b: Secondary | ICD-10-CM | POA: Diagnosis not present

## 2023-09-20 DIAGNOSIS — I471 Supraventricular tachycardia, unspecified: Secondary | ICD-10-CM | POA: Diagnosis not present

## 2023-09-20 DIAGNOSIS — Z885 Allergy status to narcotic agent status: Secondary | ICD-10-CM | POA: Diagnosis not present

## 2023-09-20 DIAGNOSIS — Z79621 Long term (current) use of calcineurin inhibitor: Secondary | ICD-10-CM | POA: Diagnosis not present

## 2023-09-20 DIAGNOSIS — E785 Hyperlipidemia, unspecified: Secondary | ICD-10-CM | POA: Diagnosis not present

## 2023-09-20 DIAGNOSIS — R197 Diarrhea, unspecified: Secondary | ICD-10-CM | POA: Diagnosis not present

## 2023-09-20 DIAGNOSIS — N050 Unspecified nephritic syndrome with minor glomerular abnormality: Secondary | ICD-10-CM | POA: Diagnosis not present

## 2023-09-20 DIAGNOSIS — N179 Acute kidney failure, unspecified: Secondary | ICD-10-CM | POA: Diagnosis not present

## 2023-09-20 DIAGNOSIS — I10 Essential (primary) hypertension: Secondary | ICD-10-CM | POA: Diagnosis not present

## 2023-09-20 DIAGNOSIS — Z7982 Long term (current) use of aspirin: Secondary | ICD-10-CM | POA: Diagnosis not present

## 2023-09-20 DIAGNOSIS — Z940 Kidney transplant status: Secondary | ICD-10-CM | POA: Diagnosis not present

## 2023-09-20 DIAGNOSIS — E1142 Type 2 diabetes mellitus with diabetic polyneuropathy: Secondary | ICD-10-CM | POA: Diagnosis not present

## 2023-09-20 DIAGNOSIS — Z79899 Other long term (current) drug therapy: Secondary | ICD-10-CM | POA: Diagnosis not present

## 2023-09-20 DIAGNOSIS — Q438 Other specified congenital malformations of intestine: Secondary | ICD-10-CM | POA: Diagnosis not present

## 2023-09-20 DIAGNOSIS — E119 Type 2 diabetes mellitus without complications: Secondary | ICD-10-CM | POA: Diagnosis not present

## 2023-09-20 DIAGNOSIS — K2100 Gastro-esophageal reflux disease with esophagitis, without bleeding: Secondary | ICD-10-CM | POA: Diagnosis not present

## 2023-09-20 DIAGNOSIS — K921 Melena: Secondary | ICD-10-CM | POA: Diagnosis not present

## 2023-09-20 DIAGNOSIS — Z538 Procedure and treatment not carried out for other reasons: Secondary | ICD-10-CM | POA: Diagnosis not present

## 2023-09-23 DIAGNOSIS — E1122 Type 2 diabetes mellitus with diabetic chronic kidney disease: Secondary | ICD-10-CM | POA: Diagnosis not present

## 2023-09-23 DIAGNOSIS — R799 Abnormal finding of blood chemistry, unspecified: Secondary | ICD-10-CM | POA: Diagnosis not present

## 2023-09-23 DIAGNOSIS — Z4822 Encounter for aftercare following kidney transplant: Secondary | ICD-10-CM | POA: Diagnosis not present

## 2023-09-23 DIAGNOSIS — K429 Umbilical hernia without obstruction or gangrene: Secondary | ICD-10-CM | POA: Diagnosis not present

## 2023-09-23 DIAGNOSIS — E1142 Type 2 diabetes mellitus with diabetic polyneuropathy: Secondary | ICD-10-CM | POA: Diagnosis not present

## 2023-09-23 DIAGNOSIS — Z7982 Long term (current) use of aspirin: Secondary | ICD-10-CM | POA: Diagnosis not present

## 2023-09-23 DIAGNOSIS — I10 Essential (primary) hypertension: Secondary | ICD-10-CM | POA: Diagnosis not present

## 2023-09-23 DIAGNOSIS — K219 Gastro-esophageal reflux disease without esophagitis: Secondary | ICD-10-CM | POA: Diagnosis not present

## 2023-09-23 DIAGNOSIS — M549 Dorsalgia, unspecified: Secondary | ICD-10-CM | POA: Diagnosis not present

## 2023-09-23 DIAGNOSIS — D849 Immunodeficiency, unspecified: Secondary | ICD-10-CM | POA: Diagnosis not present

## 2023-09-23 DIAGNOSIS — E559 Vitamin D deficiency, unspecified: Secondary | ICD-10-CM | POA: Diagnosis not present

## 2023-09-23 DIAGNOSIS — E875 Hyperkalemia: Secondary | ICD-10-CM | POA: Diagnosis not present

## 2023-09-23 DIAGNOSIS — D649 Anemia, unspecified: Secondary | ICD-10-CM | POA: Diagnosis not present

## 2023-09-23 DIAGNOSIS — M109 Gout, unspecified: Secondary | ICD-10-CM | POA: Diagnosis not present

## 2023-09-23 DIAGNOSIS — I120 Hypertensive chronic kidney disease with stage 5 chronic kidney disease or end stage renal disease: Secondary | ICD-10-CM | POA: Diagnosis not present

## 2023-09-23 DIAGNOSIS — K3184 Gastroparesis: Secondary | ICD-10-CM | POA: Diagnosis not present

## 2023-09-23 DIAGNOSIS — N186 End stage renal disease: Secondary | ICD-10-CM | POA: Diagnosis not present

## 2023-09-23 DIAGNOSIS — R251 Tremor, unspecified: Secondary | ICD-10-CM | POA: Diagnosis not present

## 2023-09-23 DIAGNOSIS — N179 Acute kidney failure, unspecified: Secondary | ICD-10-CM | POA: Diagnosis not present

## 2023-09-23 DIAGNOSIS — R079 Chest pain, unspecified: Secondary | ICD-10-CM | POA: Diagnosis not present

## 2023-09-23 DIAGNOSIS — Z79899 Other long term (current) drug therapy: Secondary | ICD-10-CM | POA: Diagnosis not present

## 2023-09-23 DIAGNOSIS — E785 Hyperlipidemia, unspecified: Secondary | ICD-10-CM | POA: Diagnosis not present

## 2023-09-23 DIAGNOSIS — K922 Gastrointestinal hemorrhage, unspecified: Secondary | ICD-10-CM | POA: Diagnosis not present

## 2023-09-23 DIAGNOSIS — I471 Supraventricular tachycardia, unspecified: Secondary | ICD-10-CM | POA: Diagnosis not present

## 2023-09-23 DIAGNOSIS — G63 Polyneuropathy in diseases classified elsewhere: Secondary | ICD-10-CM | POA: Diagnosis not present

## 2023-09-23 DIAGNOSIS — E1143 Type 2 diabetes mellitus with diabetic autonomic (poly)neuropathy: Secondary | ICD-10-CM | POA: Diagnosis not present

## 2023-09-23 DIAGNOSIS — Z940 Kidney transplant status: Secondary | ICD-10-CM | POA: Diagnosis not present

## 2023-09-23 DIAGNOSIS — F5101 Primary insomnia: Secondary | ICD-10-CM | POA: Diagnosis not present

## 2023-10-08 DIAGNOSIS — Z940 Kidney transplant status: Secondary | ICD-10-CM | POA: Diagnosis not present

## 2023-10-18 ENCOUNTER — Encounter: Payer: Self-pay | Admitting: *Deleted

## 2023-10-18 NOTE — Progress Notes (Signed)
 Patricia [name]                                          MRN: [id number]   10/18/2023   The [facility] Quality Team Specialist reviewed this patient medical record for the purposes of chart review for care gap closure. The following were reviewed: chart review for care gap closure-diabetic eye exam.    [facility] Quality Team

## 2023-10-18 NOTE — Progress Notes (Signed)
 Patricia [name]                                          MRN: [id number]   10/18/2023   The [facility] Quality Team Specialist reviewed this patient medical record for the purposes of chart review for care gap closure. The following were reviewed: abstraction for care gap closure-glycemic status assessment.    [facility] Quality Team

## 2023-10-21 DIAGNOSIS — Z940 Kidney transplant status: Secondary | ICD-10-CM | POA: Diagnosis not present

## 2023-10-23 DIAGNOSIS — G63 Polyneuropathy in diseases classified elsewhere: Secondary | ICD-10-CM | POA: Diagnosis not present

## 2023-10-23 DIAGNOSIS — F5101 Primary insomnia: Secondary | ICD-10-CM | POA: Diagnosis not present

## 2023-10-23 DIAGNOSIS — N186 End stage renal disease: Secondary | ICD-10-CM | POA: Diagnosis not present

## 2023-11-04 DIAGNOSIS — G8929 Other chronic pain: Secondary | ICD-10-CM | POA: Diagnosis not present

## 2023-11-04 DIAGNOSIS — Z940 Kidney transplant status: Secondary | ICD-10-CM | POA: Diagnosis not present

## 2023-11-04 DIAGNOSIS — D849 Immunodeficiency, unspecified: Secondary | ICD-10-CM | POA: Diagnosis not present

## 2023-11-04 DIAGNOSIS — R251 Tremor, unspecified: Secondary | ICD-10-CM | POA: Diagnosis not present

## 2023-11-04 DIAGNOSIS — M792 Neuralgia and neuritis, unspecified: Secondary | ICD-10-CM | POA: Diagnosis not present

## 2023-11-22 ENCOUNTER — Other Ambulatory Visit: Payer: Self-pay | Admitting: Internal Medicine

## 2023-11-22 DIAGNOSIS — Z1231 Encounter for screening mammogram for malignant neoplasm of breast: Secondary | ICD-10-CM

## 2023-12-16 ENCOUNTER — Ambulatory Visit
Admission: RE | Admit: 2023-12-16 | Discharge: 2023-12-16 | Disposition: A | Discharge status: Home / Self Care | Source: Ambulatory Visit | Attending: Internal Medicine | Admitting: Internal Medicine

## 2023-12-16 DIAGNOSIS — Z1231 Encounter for screening mammogram for malignant neoplasm of breast: Secondary | ICD-10-CM

## 2024-01-15 ENCOUNTER — Ambulatory Visit (HOSPITAL_COMMUNITY)

## 2024-01-15 ENCOUNTER — Ambulatory Visit (HOSPITAL_COMMUNITY): Admission: EM | Admit: 2024-01-15 | Discharge: 2024-01-15 | Disposition: A | Discharge status: Home / Self Care

## 2024-01-15 ENCOUNTER — Encounter (HOSPITAL_COMMUNITY): Payer: Self-pay

## 2024-01-15 DIAGNOSIS — M25571 Pain in right ankle and joints of right foot: Secondary | ICD-10-CM

## 2024-01-15 DIAGNOSIS — S93401A Sprain of unspecified ligament of right ankle, initial encounter: Secondary | ICD-10-CM | POA: Diagnosis not present

## 2024-01-15 MED ORDER — HYDROCODONE-ACETAMINOPHEN 5-325 MG PO TABS: 1.0000 | ORAL_TABLET | Freq: Three times a day (TID) | ORAL | 0 refills | Status: DC | PRN

## 2024-01-15 MED ORDER — HYDROCODONE-ACETAMINOPHEN 5-325 MG PO TABS: 1.0000 | ORAL_TABLET | Freq: Three times a day (TID) | ORAL | 0 refills | Status: AC | PRN

## 2024-01-15 NOTE — Discharge Instructions (Addendum)
 Your x-rays of your right ankle were negative for acute fracture or dislocation.  It does show extensive degenerative changes.  You have been placed in a CAM boot.  Wear the boot we provided in the clinic for the next couple of weeks to provide compression, stability, and comfort.  Weightbearing as tolerated.  You have been provided crutches.  Use them as needed.  Please rest, ice, and elevate your right ankle to help it heal and decrease inflammation.   Prescription given for hydrocodone -acetaminophen  5-325 mg.  Take 1 tablet every 8 hours as needed for severe pain.    Take 650mg  tylenol  every 6 hours as needed for pain and inflammation. Take with food to avoid stomach upset.  Call the orthopedic provider listed on your discharge paperwork to schedule a follow-up appointment.  Orthopedic follow-up information below: [facility] [address] [place], [place]  [address] [phone]  Return to urgent care if you experience worsening pain, numbness, tingling, change of color in your skin near the injury, or any other concerning symptoms.  I hope you feel better!

## 2024-01-15 NOTE — ED Provider Notes (Signed)
 " [facility]    CSN: [id number] Arrival date & time: 01/15/24  1915      History   Chief Complaint Chief Complaint  Patient presents with   Ankle Pain    HPI Patricia [name] is a 49 y.o. female.   This 49 year old female is being seen for complaints of right ankle pain.  She reports pain and swelling ongoing for 3 days.  She denies known injury, trauma, falls.  She says she was trying to put on some boots recently and may have twisted her ankle trying to get her foot in the boot.  She says it is difficult to bear weight.  She has neuropathy in her feet at baseline.  She denies chest pain, shortness of breath.  She has taken Tylenol  with minimal relief of symptoms.   Ankle Pain Associated symptoms: no fever     Past Medical History:  Diagnosis Date   Acid reflux    Anemia 04/2019   REQUIRING BLOOD TRANSFUSIONS   Anxiety    Arthritis    CHF (congestive heart failure) (HCC)    Dyspnea    with exertion   Dysrhythmia    tachycardia   ESRD (end stage renal disease) (HCC)    [facility]   Facial twitching 11/2020   mouth twitch, eyes roll back, neurology started patient on low dose   Sinemet  IR.   GI bleeding 12/2017   Gout    Headache(784.0)    related to chemo; sometimes weekly (09/12/2013)   Heart murmur    Systolic per Dr Salena Negri' notes   High cholesterol    History of blood transfusion a couple   related to low counts   Hypertension    Iron  deficiency anemia    get epogen shots q month (02/20/2014)   MCGN (minimal change glomerulonephritis)    using chemo to tx;  finished my last tx in 12/2013   Neuropathy    Peritoneal dialysis status    Stroke (HCC) 08/15/2017   ruled out that it was not a stroke    Patient Active Problem List   Diagnosis Date Noted   Gastroparesis 12/31/2022   OSA on CPAP 12/31/2022   Rectal bleeding 10/23/2022   Acute on chronic anemia 10/19/2022   Obesity (BMI 30-39.9) 08/25/2022   Acute  encephalopathy 08/24/2022   (HFpEF) heart failure with preserved ejection fraction (HCC) 08/24/2022   Acute respiratory distress 08/11/2022   Pneumonia due to COVID-19 virus 08/10/2022   Facial twitching 08/09/2022   CAP (community acquired pneumonia) 07/21/2022   Acute pulmonary edema (HCC) 07/20/2022   Hypervolemia associated with renal insufficiency 05/11/2022   Hyperkalemia 05/11/2022   Restless leg syndrome 04/23/2022   Anaphylactic shock, unspecified, initial encounter 09/07/2021   Gout 08/21/2021   GERD without esophagitis 08/21/2021   Anxiety and depression 08/21/2021   Neuropathic pain 05/26/2021   Dystonia 01/13/2021   Polyneuropathy associated with underlying disease 01/13/2021   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   Other specified complication of vascular prosthetic devices, implants and grafts, initial encounter 09/03/2020   Contact with and (suspected) exposure to covid-19 08/13/2020   Altered mental status, unspecified 06/25/2020   Other irregular eye movements 06/25/2020   PD catheter dysfunction 04/26/2020   Other disorders of phosphorus metabolism 04/17/2020   Hypercalcemia 04/15/2020   Impacted cerumen of both ears 04/05/2020   Chronic gouty arthritis 04/05/2020   GAD (generalized anxiety disorder) 04/05/2020   History of hypertension 04/05/2020   Peripheral neuropathy 04/05/2020   Primary  insomnia 04/05/2020   Malnutrition of moderate degree 03/13/2020   Hypertensive urgency 03/11/2020   Myoclonia 03/11/2020   Abscess of vulva 02/19/2020   Pericolonic abscess 02/06/2020   Chest pain 05/10/2019   Anemia 04/25/2019   SVT (supraventricular tachycardia) 04/25/2019   Elevated troponin 04/25/2019   ESRD on dialysis (HCC) 04/25/2019   Bacterial infection, unspecified 04/17/2019   Pain, unspecified 04/17/2019   Pruritus, unspecified 04/17/2019   Benign paroxysmal positional vertigo of left ear 08/17/2018   Acute on chronic blood loss anemia 05/21/2018   Lower GI bleed  05/20/2018   Symptomatic anemia 10/28/2017   Acidosis 05/24/2017   Chills (without fever) 05/24/2017   Coagulation defect, unspecified 05/24/2017   Dependence on renal dialysis 05/24/2017   Encounter for fitting and adjustment of extracorporeal dialysis catheter 05/24/2017   Fluid overload, unspecified 05/24/2017   Iron  deficiency anemia, unspecified 05/24/2017   Liver disease, unspecified 05/24/2017   Other disorders of electrolyte and fluid balance, not elsewhere classified 05/24/2017   Other disorders resulting from impaired renal tubular function 05/24/2017   Other long term (current) drug therapy 05/24/2017   Secondary hyperparathyroidism of renal origin 05/24/2017   Unspecified abnormal findings in urine 05/24/2017   Unspecified jaundice 05/24/2017   Chronic kidney disease 05/24/2017   Type 2 diabetes mellitus with other diabetic kidney complication (HCC) 05/24/2017   ESRD on peritoneal dialysis (HCC) 04/01/2016   Anemia due to chronic kidney disease 04/01/2016   GERD (gastroesophageal reflux disease) 04/01/2016   Atypical chest pain    Nephrotic syndrome 10/02/2013   Dyslipidemia 10/02/2013   PAT (paroxysmal atrial tachycardia) 09/29/2013   Normocytic anemia 06/29/2013   Glomerulonephritis 01/28/2013   Dyspnea 07/04/2011   Morbid obesity (HCC) 07/04/2011   Essential hypertension 07/04/2011   Non-insulin  dependent type 2 diabetes mellitus (HCC) 07/04/2011   Hypercholesterolemia 07/04/2011    Past Surgical History:  Procedure Laterality Date   ABDOMINAL HYSTERECTOMY  2010   laparoscopic   ANKLE FRACTURE SURGERY Right 1994   AV FISTULA PLACEMENT Left 04/14/2016   Procedure: ARTERIOVENOUS (AV) FISTULA CREATION;  Surgeon: Lonni GORMAN Blade, MD;  Location: [facility] OR;  Service: Vascular;  Laterality: Left;   AV FISTULA PLACEMENT Left 08/09/2017   Procedure: INSERTION OF ARTERIOVENOUS (AV) GORE-TEX GRAFT LEFT ARM;  Surgeon: Harvey Carlin BRAVO, MD;  Location: [facility] OR;  Service:  Vascular;  Laterality: Left;   AV FISTULA PLACEMENT Right 03/15/2020   Procedure: RIGHT ARM ARTERIOVENOUS (AV) FISTULA CREATION 1st Stage Basilic Transposition;  Surgeon: Serene Gaile ORN, MD;  Location: [facility] OR;  Service: Vascular;  Laterality: Right;   AV FISTULA PLACEMENT Right 02/24/2021   Procedure: RIGHT ARM ARTERIOVENOUS  FISTULA CREATION;  Surgeon: Lanis Fonda BRAVO, MD;  Location: [facility] OR;  Service: Vascular;  Laterality: Right;  regional block   AVGG REMOVAL Left 08/11/2017   Procedure: REMOVAL OF ARTERIOVENOUS GORETEX GRAFT (AVGG);  Surgeon: Serene Gaile ORN, MD;  Location: [facility] OR;  Service: Vascular;  Laterality: Left;   BASCILIC VEIN TRANSPOSITION Right 05/17/2020   Procedure: RIGHT UPPER EXTREMITY SECOND STAGE BASCILIC VEIN TRANSPOSITION;  Surgeon: Serene Gaile ORN, MD;  [facility] OR;  Service: Vascular;  Laterality: Right;   BIOPSY  09/03/2017   Procedure: BIOPSY;  Surgeon: Elicia Claw, MD;  Location: [facility] ENDOSCOPY;  Service: Gastroenterology;;   BIOPSY  12/24/2017   Procedure: BIOPSY;  Surgeon: Wilhelmenia Aloha Raddle., MD;  Location: [facility] ENDOSCOPY;  Service: Gastroenterology;;   CARDIAC CATHETERIZATION  2000's   COLONOSCOPY N/A 10/22/2022   Procedure: COLONOSCOPY;  Surgeon: Maryruth Ole DASEN, MD;  Location: [facility] ENDOSCOPY;  Service: Endoscopy;  Laterality: N/A;   COLONOSCOPY WITH PROPOFOL  N/A 09/04/2017   Procedure: COLONOSCOPY WITH PROPOFOL ;  Surgeon: Saintclair Jasper, MD;  Location: [facility] ENDOSCOPY;  Service: Gastroenterology;  Laterality: N/A;   ENTEROSCOPY N/A 12/24/2017   Procedure: ENTEROSCOPY;  Surgeon: Wilhelmenia Aloha Raddle., MD;  Location: [facility] ENDOSCOPY;  Service: Gastroenterology;  Laterality: N/A;   ESOPHAGOGASTRODUODENOSCOPY     ESOPHAGOGASTRODUODENOSCOPY (EGD) WITH PROPOFOL  Left 06/04/2017   Procedure: ESOPHAGOGASTRODUODENOSCOPY (EGD) WITH PROPOFOL ;  Surgeon: Burnette Fallow, MD;  Location: [facility] ENDOSCOPY;  Service: Endoscopy;  Laterality: Left;   ESOPHAGOGASTRODUODENOSCOPY (EGD)  WITH PROPOFOL  N/A 09/03/2017   Procedure: ESOPHAGOGASTRODUODENOSCOPY (EGD) WITH PROPOFOL ;  Surgeon: Elicia Claw, MD;  [facility] ENDOS[facility];  Service: Gastroenterology;  Laterality: N/A;   ESOPHAGOGASTRODUODENOSCOPY (EGD) WITH PROPOFOL  N/A 04/27/2022   Procedure: ESOPHAGOGASTRODUODENOSCOPY (EGD) WITH PROPOFOL ;  Surgeon: Maryruth Ole DASEN, MD;  Location: [facility] ENDOSCOPY;  Service: Endoscopy;  Laterality: N/A;   FISTULA SUPERFICIALIZATION Left 04/02/2017   Procedure: FISTULA SUPERFICIALIZATION LEFT ARM;  Surgeon: Serene Gaile ORN, MD;  Location: [facility] OR;  Service: Vascular;  Laterality: Left;   FISTULA SUPERFICIALIZATION Right 04/14/2021   Procedure: RIGHT ARM FISTULA SUPERFICIALIZATION;  Surgeon: Lanis Fonda BRAVO, MD;  Location: [facility] OR;  Service: Vascular;  Laterality: Right;  PERIPHERAL NERVE BLOCK   FISTULOGRAM Left 04/07/2016   Procedure: FISTULOGRAM;  Surgeon: Penne Lonni Colorado, MD;  Location: [facility] OR;  Service: Vascular;  Laterality: Left;   FRACTURE SURGERY     right ankle   GIVENS CAPSULE STUDY N/A 10/29/2017   Procedure: GIVENS CAPSULE STUDY;  Surgeon: Dianna Specking, MD;  Location: [facility] ENDOSCOPY;  Service: Endoscopy;  Laterality: N/A;   GIVENS CAPSULE STUDY N/A 12/24/2017   Procedure: GIVENS CAPSULE STUDY;  Surgeon: Wilhelmenia Aloha Raddle., MD;  Location: [facility] ENDOSCOPY;  Service: Gastroenterology;  Laterality: N/A;   GIVENS CAPSULE STUDY N/A 05/22/2018   Procedure: GIVENS CAPSULE STUDY;  Surgeon: Wilhelmenia Aloha Raddle., MD;  Location: [facility] ENDOSCOPY;  Service: Gastroenterology;  Laterality: N/A;   INSERTION OF DIALYSIS CATHETER Right 04/07/2016   Procedure: INSERTION OF DIALYSIS CATHETER;  Surgeon: Penne Lonni Colorado, MD;  Location: [facility] OR;  Service: Vascular;  Laterality: Right;   INSERTION OF DIALYSIS CATHETER Right 04/04/2017   Procedure: INSERTION OF DIALYSIS CATHETER;  Surgeon: [place] Penne Lonni, MD;  Location: [facility] OR;  Service: Vascular;  Laterality: Right;   IR FLUORO  GUIDE CV LINE LEFT  03/13/2020   IR US  GUIDE VASC ACCESS LEFT  03/13/2020   LEFT HEART CATH AND CORONARY ANGIOGRAPHY N/A 05/12/2019   Procedure: LEFT HEART CATH AND CORONARY ANGIOGRAPHY;  Surgeon: Claudene Pacific, MD;  [facility] INVASIVE CV LAB;  Service: Cardiovascular;  Laterality: N/A;   LIGATION OF ARTERIOVENOUS  FISTULA Left 04/14/2016   Procedure: LIGATION OF ARTERIOVENOUS  FISTULA;  Surgeon: Lonni GORMAN Blade, MD;  Location: [facility] OR;  Service: Vascular;  Laterality: Left;   LIGATION OF COMPETING BRANCHES OF ARTERIOVENOUS FISTULA Right 05/17/2020   Procedure: LIGATION OF COMPETING BRANCHES OF RIGHT ARM ARTERIOVENOUS FISTULA;  Surgeon: Serene Gaile ORN, MD;  Location[facility] OR;  Service: Vascular;  Laterality: Right;   PATCH ANGIOPLASTY Left 06/19/2016   Procedure: PATCH ANGIOPLASTY LEFT ARTERIOVENOUS FISTULA;  Surgeon: Blade Lonni GORMAN, MD;  Location: [facility] OR;  Service: Vascular;  Laterality: Left;   POLYPECTOMY  09/04/2017   Procedure: POLYPECTOMY;  Surgeon: Saintclair Jasper, MD;  Location: [facility] ENDOSCOPY;  Service: Gastroenterology;;   REVISON OF ARTERIOVENOUS FISTULA Left 06/19/2016  Procedure: REVISION SUPERFICIALIZATION OF BRACHIOCEPHALIC ARTERIOVENOUS FISTULA;  Surgeon: Eliza Lonni RAMAN, MD;  Location: [facility] OR;  Service: Vascular;  Laterality: Left;   UPPER EXTREMITY VENOGRAPHY Bilateral 02/19/2021   Procedure: UPPER EXTREMITY VENOGRAPHY;  Surgeon: Lanis Fonda BRAVO, MD;  Location: [facility] INVASIVE CV LAB;  Service: Cardiovascular;  Laterality: Bilateral;    OB History     Gravida  3   Para      Term      Preterm      AB      Living         SAB      IAB      Ectopic      Multiple  3   Live Births               Home Medications    Prior to Admission medications  Medication Sig Start Date End Date Taking? Authorizing Provider  HYDROcodone -acetaminophen  (NORCO/VICODIN) 5-325 MG tablet Take 1 tablet by mouth every 8 (eight) hours as needed for up to 2 days for severe  pain (pain score 7-10). 01/15/24 01/17/24 Yes Dimitriy Carreras C, FNP  acetaminophen  (TYLENOL ) 500 MG tablet Take 1,000 mg by mouth every 6 (six) hours as needed for moderate pain.    [provider]  allopurinol  (ZYLOPRIM ) 100 MG tablet Take 100 mg by mouth daily.    [provider]  ALPRAZolam  (XANAX ) 1 MG tablet Take 1 mg by mouth 3 (three) times daily as needed for anxiety or sleep.    [provider]  carbidopa -levodopa  (SINEMET  IR) 25-100 MG tablet TAKE 1 TABLET BY MOUTH THREE TIMES DAILY 02/22/23   Onita Duos, MD  carvedilol  (COREG ) 25 MG tablet Take 1 tablet (25 mg total) by mouth 2 (two) times daily with a meal. 07/24/22   Wouk, Devaughn Sayres, MD  chlorthalidone  (HYGROTON ) 25 MG tablet Take 12.5 mg by mouth daily.    [provider]  cyclobenzaprine  (FLEXERIL ) 5 MG tablet Take 1 tablet (5 mg total) by mouth 3 (three) times daily as needed. 09/12/23   Willo Dunnings, MD  DULoxetine  (CYMBALTA ) 30 MG capsule TAKE 1 CAPSULE(30 MG) BY MOUTH DAILY Patient taking differently: Take 30 mg by mouth 2 (two) times daily. 03/23/23   Onita Duos, MD  eszopiclone (LUNESTA) 1 MG TABS tablet Take 1 mg by mouth at bedtime. 09/17/22   [provider]  ferrous sulfate  325 (65 FE) MG EC tablet Take 1 tablet (325 mg total) by mouth 2 (two) times daily. Patient not taking: Reported on 08/02/2023 10/23/22 10/23/23  Amin, Sumayya, MD  gabapentin  (NEURONTIN ) 100 MG capsule Take 100 mg by mouth 2 (two) times daily. 04/27/23   [provider]  gabapentin  (NEURONTIN ) 300 MG capsule Take 300 mg by mouth at bedtime. 04/27/23   [provider]  hydrOXYzine  (ATARAX /VISTARIL ) 25 MG tablet Take 25 mg by mouth 3 (three) times daily.    [provider]  mycophenolate  (CELLCEPT ) 250 MG capsule Take 750 mg by mouth 2 (two) times daily. 05/01/23   [provider]  NON FORMULARY Take 133 mg by mouth in the morning and at bedtime. Magnesium  Amino-Acid Chelate 133 mg  tablet twice a day.    [provider]  omeprazole  (PRILOSEC) 40 MG capsule Take 1 capsule (40 mg total) by mouth daily. Patient taking differently: Take 40 mg by mouth 2 (two) times daily. 08/13/22   Caleen Qualia, MD  ondansetron  (ZOFRAN ) 4 MG tablet Take 4 mg by mouth every  8 (eight) hours as needed for vomiting or nausea. 11/17/22   [provider]  polyethylene glycol (MIRALAX ) 17 g packet Take 17 g by mouth daily as needed for mild constipation or moderate constipation. 01/01/23   Jillian Buttery, MD  promethazine  (PHENERGAN ) 25 MG tablet Take 25 mg by mouth 3 (three) times daily as needed for nausea or vomiting.    [provider]  rOPINIRole  (REQUIP ) 1 MG tablet TAKE 1 TABLET(1 MG) BY MOUTH AT BEDTIME 10/26/22   Onita Duos, MD  tacrolimus  (PROGRAF ) 1 MG capsule Take 4 mg by mouth 2 (two) times daily.    [provider]  Vilazodone HCl (VIIBRYD) 10 MG TABS Take 10 mg by mouth daily.    [provider]    Family History Family History  Problem Relation Age of Onset   Hypertension Mother    Thyroid  disease Mother    Coronary artery disease Father    Hypertension Father    Diabetes Father    Colon cancer Maternal Aunt    Esophageal cancer Maternal Uncle    Inflammatory bowel disease Neg Hx    Liver disease Neg Hx    Pancreatic cancer Neg Hx    Rectal cancer Neg Hx    Stomach cancer Neg Hx     Social History Social History[1]   Allergies   Nsaids, Tolmetin, and Oxycodone    Review of Systems Review of Systems  Constitutional:  Positive for activity change. Negative for chills and fever.  Respiratory:  Negative for shortness of breath.   Cardiovascular:  Negative for chest pain.  Musculoskeletal:  Positive for arthralgias, gait problem and joint swelling.  Skin:  Negative for color change and wound.  All other systems reviewed and are negative.    Physical Exam Triage Vital Signs ED Triage Vitals  Encounter Vitals Group      BP 01/15/24 1939 120/81     Girls Systolic BP Percentile --      Girls Diastolic BP Percentile --      Boys Systolic BP Percentile --      Boys Diastolic BP Percentile --      Pulse Rate 01/15/24 1939 84     Resp 01/15/24 1939 17     Temp 01/15/24 1939 98.3 F (36.8 C)     Temp Source 01/15/24 1939 Oral     SpO2 01/15/24 1940 97 %     Weight --      Height --      Head Circumference --      Peak Flow --      Pain Score 01/15/24 1936 8     Pain Loc --      Pain Education --      Exclude from Growth Chart --    No data found.  Updated Vital Signs BP 120/81 (BP Location: Left Arm)   Pulse 84   Temp 98.3 F (36.8 C) (Oral)   Resp 17   SpO2 97%   Visual Acuity Right Eye Distance:   Left Eye Distance:   Bilateral Distance:    Right Eye Near:   Left Eye Near:    Bilateral Near:     Physical Exam Vitals and nursing note reviewed.  Constitutional:      General: She is not in acute distress.    Appearance: She is well-developed.     Comments: Pleasant female appearing stated age found sitting in chair in no acute distress.  HENT:     Head: Normocephalic  and atraumatic.  Eyes:     Conjunctiva/sclera: Conjunctivae normal.  Cardiovascular:     Rate and Rhythm: Normal rate and regular rhythm.     Heart sounds: Normal heart sounds. No murmur heard. Pulmonary:     Effort: Pulmonary effort is normal. No respiratory distress.     Breath sounds: Normal breath sounds.  Abdominal:     Palpations: Abdomen is soft.     Tenderness: There is no abdominal tenderness.  Musculoskeletal:     Right ankle: Swelling present. Tenderness present. Decreased range of motion. Normal pulse.  Skin:    General: Skin is warm and dry.     Capillary Refill: Capillary refill takes less than 2 seconds.  Neurological:     Mental Status: She is alert.  Psychiatric:        Mood and Affect: Mood normal.      UC Treatments / Results  Labs (all labs ordered are listed, but only abnormal  results are displayed) Labs Reviewed - No data to display  EKG   Radiology DG Ankle Complete Right Result Date: 01/15/2024 EXAM: 3 OR MORE VIEW(S) XRAY OF THE RIGHT ANKLE 01/15/2024 08:03:49 PM CLINICAL HISTORY: swelling swelling COMPARISON: None available. FINDINGS: BONES AND JOINTS: Remote fracture deformity of the talus again noted. Probable early changes of Charcot joint with obliteration of the subtalar joint space and subchondral lucency and fragmentation along the talocalcaneal interface. Progressive degenerative arthritis along the talonavicular and calcaneocuboid articulations. No acute fracture. SOFT TISSUES: Extensive bimalleolar soft tissue swelling, medial greater than lateral. IMPRESSION: 1. Extensive bimalleolar soft tissue swelling, medial greater than lateral. 2. Probable early changes of Charcot joint with obliteration of the subtalar joint space and subchondral lucency and fragmentation along the talocalcaneal interface. 3. Progressive degenerative arthritis along the talonavicular and calcaneocuboid articulations. 4. Remote fracture deformity of the talus again noted. Electronically signed by: Dorethia Molt MD MD 01/15/2024 08:10 PM EST RP Workstation: [id number]    Procedures Procedures (including critical care time)  Medications Ordered in UC Medications - No data to display  Initial Impression / Assessment and Plan / UC Course  I have reviewed the triage vital signs and the nursing notes.  Pertinent labs & imaging results that were available during my care of the patient were reviewed by me and considered in my medical decision making (see chart for details).     Vitals and triage reviewed, patient is hemodynamically stable.  Right ankle x-ray obtained and significant for extensive swelling, probable early changes of Charcot joint with obliteration of the subtalar joint space and subchondral lucency and fragmentation along the talocalcaneal interface, progressive  degenerative arthritis along the talonavicular and calcaneocuboid articulations.  She is placed in a CAM boot and provided crutches.  She is given a short course of Norco tablets for pain.  She is advised Tylenol , RICE.  She is given follow-up information for [facility].  Plan of care, follow-up care, return precautions given, no questions at this time. Final Clinical Impressions(s) / UC Diagnoses   Final diagnoses:  Acute right ankle pain  Sprain of right ankle, unspecified ligament, initial encounter     Discharge Instructions      Your x-rays of your right ankle were negative for acute fracture or dislocation.  It does show extensive degenerative changes.  You have been placed in a CAM boot.  Wear the boot we provided in the clinic for the next couple of weeks to provide compression, stability, and comfort.  Weightbearing as  tolerated.  You have been provided crutches.  Use them as needed.  Please rest, ice, and elevate your right ankle to help it heal and decrease inflammation.   Prescription given for hydrocodone -acetaminophen  5-325 mg.  Take 1 tablet every 8 hours as needed for severe pain.    Take 650mg  tylenol  every 6 hours as needed for pain and inflammation. Take with food to avoid stomach upset.  Call the orthopedic provider listed on your discharge paperwork to schedule a follow-up appointment.  Orthopedic follow-up information below: [facility] [address] [place], [place]  [address] [phone]  Return to urgent care if you experience worsening pain, numbness, tingling, change of color in your skin near the injury, or any other concerning symptoms.  I hope you feel better!      ED Prescriptions     Medication Sig Dispense Auth. Provider   HYDROcodone -acetaminophen  (NORCO/VICODIN) 5-325 MG tablet Take 1 tablet by mouth every 8 (eight) hours as needed for up to 2 days for severe pain (pain score 7-10). 6 tablet Teola Felipe C, FNP       I have reviewed the PDMP during this encounter.    [1]  Social History Tobacco Use   Smoking status: Never   Smokeless tobacco: Never  Vaping Use   Vaping status: Never Used  Substance Use Topics   Alcohol  use: No   Drug use: Never     Lennice Jon BROCKS, FNP 01/15/24 2041  "

## 2024-01-15 NOTE — ED Triage Notes (Signed)
 Pt has c/o right ankle pain x 3 days. Pt denies injury to the ankle, says she has neuropathy. Pt has taken tylenol  at home last night.

## 2024-01-17 ENCOUNTER — Ambulatory Visit (INDEPENDENT_AMBULATORY_CARE_PROVIDER_SITE_OTHER)

## 2024-01-17 ENCOUNTER — Encounter: Payer: Self-pay | Admitting: Podiatry

## 2024-01-17 ENCOUNTER — Ambulatory Visit: Admitting: Podiatry

## 2024-01-17 VITALS — Ht 67.0 in | Wt 228.0 lb

## 2024-01-17 DIAGNOSIS — M216X1 Other acquired deformities of right foot: Secondary | ICD-10-CM

## 2024-01-17 DIAGNOSIS — M19071 Primary osteoarthritis, right ankle and foot: Secondary | ICD-10-CM | POA: Diagnosis not present

## 2024-01-18 ENCOUNTER — Ambulatory Visit: Admitting: Podiatry

## 2024-01-19 NOTE — Progress Notes (Signed)
 "  Chief Complaint  Patient presents with   Ankle Pain    Pt is here due to right ankle pain, she was seen at urgent care on 1/10 was given boot to wear, was told that she needed to f/u with podiatry, she states is a little better then before.    Subjective:  49 y.o. female presenting today for follow-up of acute onset of right foot and ankle pain ongoing for about 1 month now.  Idiopathic onset.  No history of injury.  Currently wearing a cam boot   Past Medical History:  Diagnosis Date   Acid reflux    Anemia 04/2019   REQUIRING BLOOD TRANSFUSIONS   Anxiety    Arthritis    CHF (congestive heart failure) (HCC)    Dyspnea    with exertion   Dysrhythmia    tachycardia   ESRD (end stage renal disease) (HCC)    [facility]   Facial twitching 11/2020   mouth twitch, eyes roll back, neurology started patient on low dose   Sinemet  IR.   GI bleeding 12/2017   Gout    Headache(784.0)    related to chemo; sometimes weekly (09/12/2013)   Heart murmur    Systolic per Dr Salena Negri' notes   High cholesterol    History of blood transfusion a couple   related to low counts   Hypertension    Iron  deficiency anemia    get epogen shots q month (02/20/2014)   MCGN (minimal change glomerulonephritis)    using chemo to tx;  finished my last tx in 12/2013   Neuropathy    Peritoneal dialysis status    Stroke (HCC) 08/15/2017   ruled out that it was not a stroke    Past Surgical History:  Procedure Laterality Date   ABDOMINAL HYSTERECTOMY  2010   laparoscopic   ANKLE FRACTURE SURGERY Right 1994   AV FISTULA PLACEMENT Left 04/14/2016   Procedure: ARTERIOVENOUS (AV) FISTULA CREATION;  Surgeon: Lonni GORMAN Blade, MD;  Location: [facility] OR;  Service: Vascular;  Laterality: Left;   AV FISTULA PLACEMENT Left 08/09/2017   Procedure: INSERTION OF ARTERIOVENOUS (AV) GORE-TEX GRAFT LEFT ARM;  Surgeon: Harvey Carlin BRAVO, MD;  Location: [facility] OR;  Service: Vascular;  Laterality: Left;    AV FISTULA PLACEMENT Right 03/15/2020   Procedure: RIGHT ARM ARTERIOVENOUS (AV) FISTULA CREATION 1st Stage Basilic Transposition;  Surgeon: Serene Gaile ORN, MD;  Location: [facility] OR;  Service: Vascular;  Laterality: Right;   AV FISTULA PLACEMENT Right 02/24/2021   Procedure: RIGHT ARM ARTERIOVENOUS  FISTULA CREATION;  Surgeon: Lanis Fonda BRAVO, MD;  Location: [facility] OR;  Service: Vascular;  Laterality: Right;  regional block   AVGG REMOVAL Left 08/11/2017   Procedure: REMOVAL OF ARTERIOVENOUS GORETEX GRAFT (AVGG);  Surgeon: Serene Gaile ORN, MD;  Location: [facility] OR;  Service: Vascular;  Laterality: Left;   BASCILIC VEIN TRANSPOSITION Right 05/17/2020   Procedure: RIGHT UPPER EXTREMITY SECOND STAGE BASCILIC VEIN TRANSPOSITION;  Surgeon: Serene Gaile ORN, MD;  Location: [facility] OR;  Service: Vascular;  Laterality: Right;   BIOPSY  09/03/2017   Procedure: BIOPSY;  Surgeon: Elicia Claw, MD;  Location: [facility] ENDOSCOPY;  Service: Gastroenterology;;   BIOPSY  12/24/2017   Procedure: BIOPSY;  Surgeon: Wilhelmenia Aloha Raddle., MD;  Location: [facility] ENDOSCOPY;  Service: Gastroenterology;;   CARDIAC CATHETERIZATION  2000's   COLONOSCOPY N/A 10/22/2022   Procedure: COLONOSCOPY;  Surgeon: Maryruth Ole DASEN, MD;  Location: [facility] ENDOSCOPY;  Service: Endoscopy;  Laterality: N/A;  COLONOSCOPY WITH PROPOFOL  N/A 09/04/2017   Procedure: COLONOSCOPY WITH PROPOFOL ;  Surgeon: Saintclair Jasper, MD;  Location: [facility] ENDOSCOPY;  Service: Gastroenterology;  Laterality: N/A;   ENTEROSCOPY N/A 12/24/2017   Procedure: ENTEROSCOPY;  Surgeon: Wilhelmenia Aloha Raddle., MD;  Location: [facility] ENDOSCOPY;  Service: Gastroenterology;  Laterality: N/A;   ESOPHAGOGASTRODUODENOSCOPY     ESOPHAGOGASTRODUODENOSCOPY (EGD) WITH PROPOFOL  Left 06/04/2017   Procedure: ESOPHAGOGASTRODUODENOSCOPY (EGD) WITH PROPOFOL ;  Surgeon: Burnette Fallow, MD;  Location: [facility] ENDOSCOPY;  Service: Endoscopy;  Laterality: Left;   ESOPHAGOGASTRODUODENOSCOPY (EGD) WITH PROPOFOL  N/A 09/03/2017    Procedure: ESOPHAGOGASTRODUODENOSCOPY (EGD) WITH PROPOFOL ;  Surgeon: Elicia Claw, MD;  Location: [facility] ENDOSCOPY;  Service: Gastroenterology;  Laterality: N/A;   ESOPHAGOGASTRODUODENOSCOPY (EGD) WITH PROPOFOL  N/A 04/27/2022   Procedure: ESOPHAGOGASTRODUODENOSCOPY (EGD) WITH PROPOFOL ;  Surgeon: Maryruth Ole DASEN, MD;  Location: [facility] ENDOSCOPY;  Service: Endoscopy;  Laterality: N/A;   FISTULA SUPERFICIALIZATION Left 04/02/2017   Procedure: FISTULA SUPERFICIALIZATION LEFT ARM;  Surgeon: Serene Gaile ORN, MD;  Location: [facility] OR;  Service: Vascular;  Laterality: Left;   FISTULA SUPERFICIALIZATION Right 04/14/2021   Procedure: RIGHT ARM FISTULA SUPERFICIALIZATION;  Surgeon: Lanis Fonda BRAVO, MD;  Location: [facility] OR;  Service: Vascular;  Laterality: Right;  PERIPHERAL NERVE BLOCK   FISTULOGRAM Left 04/07/2016   Procedure: FISTULOGRAM;  Surgeon: Penne Lonni Colorado, MD;  Location: [facility] OR;  Service: Vascular;  Laterality: Left;   FRACTURE SURGERY     right ankle   GIVENS CAPSULE STUDY N/A 10/29/2017   Procedure: GIVENS CAPSULE STUDY;  Surgeon: Dianna Specking, MD;  Location: [facility] ENDOSCOPY;  Service: Endoscopy;  Laterality: N/A;   GIVENS CAPSULE STUDY N/A 12/24/2017   Procedure: GIVENS CAPSULE STUDY;  Surgeon: Wilhelmenia Aloha Raddle., MD;  Location: [facility] ENDOSCOPY;  Service: Gastroenterology;  Laterality: N/A;   GIVENS CAPSULE STUDY N/A 05/22/2018   Procedure: GIVENS CAPSULE STUDY;  Surgeon: Wilhelmenia Aloha Raddle., MD;  Location: [facility] ENDOSCOPY;  Service: Gastroenterology;  Laterality: N/A;   INSERTION OF DIALYSIS CATHETER Right 04/07/2016   Procedure: INSERTION OF DIALYSIS CATHETER;  Surgeon: Penne Lonni [place], MD;  Location: [facility] OR;  Service: Vascular;  Laterality: Right;   INSERTION OF DIALYSIS CATHETER Right 04/04/2017   Procedure: INSERTION OF DIALYSIS CATHETER;  Surgeon: [place] Penne Lonni, MD;  Location: [facility] OR;  Service: Vascular;  Laterality: Right;   IR FLUORO GUIDE CV LINE LEFT   03/13/2020   IR US  GUIDE VASC ACCESS LEFT  03/13/2020   LEFT HEART CATH AND CORONARY ANGIOGRAPHY N/A 05/12/2019   Procedure: LEFT HEART CATH AND CORONARY ANGIOGRAPHY;  Surgeon: Claudene Pacific, MD;  Location: [facility] INVASIVE CV LAB;  Service: Cardiovascular;  Laterality: N/A;   LIGATION OF ARTERIOVENOUS  FISTULA Left 04/14/2016   Procedure: LIGATION OF ARTERIOVENOUS  FISTULA;  Surgeon: Lonni GORMAN Blade, MD;  Location: [facility] OR;  Service: Vascular;  Laterality: Left;   LIGATION OF COMPETING BRANCHES OF ARTERIOVENOUS FISTULA Right 05/17/2020   Procedure: LIGATION OF COMPETING BRANCHES OF RIGHT ARM ARTERIOVENOUS FISTULA;  Surgeon: Serene Gaile ORN, MD;  Location: [facility] OR;  Service: Vascular;  Laterality: Right;   PATCH ANGIOPLASTY Left 06/19/2016   Procedure: PATCH ANGIOPLASTY LEFT ARTERIOVENOUS FISTULA;  Surgeon: Blade Lonni GORMAN, MD;  Location: [facility] OR;  Service: Vascular;  Laterality: Left;   POLYPECTOMY  09/04/2017   Procedure: POLYPECTOMY;  Surgeon: Saintclair Jasper, MD;  Location: [facility] ENDOSCOPY;  Service: Gastroenterology;;   REVISON OF ARTERIOVENOUS FISTULA Left 06/19/2016   Procedure: REVISION SUPERFICIALIZATION OF BRACHIOCEPHALIC ARTERIOVENOUS FISTULA;  Surgeon: Blade Lonni GORMAN, MD;  Location: [facility] OR;  Service: Vascular;  Laterality: Left;   UPPER EXTREMITY VENOGRAPHY Bilateral 02/19/2021   Procedure: UPPER EXTREMITY VENOGRAPHY;  Surgeon: Lanis Fonda BRAVO, MD;  Location: [facility] INVASIVE CV LAB;  Service: Cardiovascular;  Laterality: Bilateral;    Allergies[1]  Objective / Physical Exam:  General:  The patient is alert and oriented x3 in no acute distress. Dermatology:  Skin is warm, dry and supple bilateral lower extremities. Negative for open lesions or macerations. VAS US  ABI WITH/WO TBI (Accession [id number]) (Order [id number]) Vascular Ultrasound Date: 01/13/2021 Department: [place] [facility] [address] Released By: Orlinda Gains T Authorizing: Tobie Franky SQUIBB, DPM  ABI Findings:   +---------+------------------+-----+---------+--------+  Right   Rt Pressure (mmHg)IndexWaveform Comment   +---------+------------------+-----+---------+--------+  PTA     134               1.10 triphasic          +---------+------------------+-----+---------+--------+  DP      120               0.98 biphasic           +---------+------------------+-----+---------+--------+  Great Toe96                0.79 Normal             +---------+------------------+-----+---------+--------+   +---------+------------------+-----+---------+-------+  Left    Lt Pressure (mmHg)IndexWaveform Comment  +---------+------------------+-----+---------+-------+  Brachial 122                                      +---------+------------------+-----+---------+-------+  PTA     134               1.10 triphasic         +---------+------------------+-----+---------+-------+  DP      127               1.04 triphasic         +---------+------------------+-----+---------+-------+  Great Toe89                0.73 Normal            +---------+------------------+-----+---------+-------+   +-------+-----------+-----------+------------+------------+  ABI/TBIToday's ABIToday's TBIPrevious ABIPrevious TBI  +-------+-----------+-----------+------------+------------+  Right 1.10       0.79                                 +-------+-----------+-----------+------------+------------+  Left  1.10       0.73                                 +-------+-----------+-----------+------------+------------+  Summary:  Right: Resting right ankle-brachial index is within normal range. No evidence of significant right lower extremity arterial disease. The right toe-brachial index is normal.  Left: Resting left ankle-brachial index is within normal range. No  evidence of significant left lower extremity arterial disease. The left toe-brachial index is normal.   Neurological:  Light touch and protective threshold diminished bilaterally.  Musculoskeletal Exam:  Diffuse pain throughout palpation to the foot and ankle  Radiographic Exam RT foot 01/17/2024:  Normal osseous mineralization. Joint spaces preserved. No fracture/dislocation/boney destruction.    Assessment: 1.  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 X-Rays reviewed and compared to prior  x-rays.  -Continue strict nonweightbearing in the cam boot -Discussed the pathology and etiology of Charcot neuroarthropathy to the patient.  For now recommend reduced activity with nonweightbearing and elevation of the foot -Return to clinic 4 weeks follow-up x-ray RT foot   Thresa EMERSON Sar, DPM [facility]  Dr. Thresa EMERSON Sar, DPM    [address] [place], [place] [address]                Office [phone]  Fax [phone]     [1]  Allergies Allergen Reactions   Nsaids Other (See Comments)    Cannot take due to Kidney disease/Kidney function  Other Reaction(s): Unknown   Tolmetin Other (See Comments)    Cannot take due to Kidney Disease   Oxycodone  Itching   "

## 2024-02-11 ENCOUNTER — Telehealth: Payer: Self-pay | Admitting: Podiatry

## 2024-02-11 NOTE — Telephone Encounter (Signed)
 Patricia [name] called states Dr. Janit put her in a boot and her foot is getting worse. I did schedule her an appointment  Monday with Dr Janit In [place] at 1:45p. Patricia [name] requested Dr Janit be sent a message as well.

## 2024-02-14 ENCOUNTER — Ambulatory Visit: Admitting: Podiatry

## 2024-03-01 ENCOUNTER — Ambulatory Visit: Admitting: Podiatry
